# Patient Record
Sex: Female | Born: 1970
Health system: Southern US, Community
[De-identification: ages and names within clinical notes are randomized; demographics above are authoritative.]

## PROBLEM LIST (undated history)

## (undated) DIAGNOSIS — G8929 Other chronic pain: Secondary | ICD-10-CM

## (undated) DIAGNOSIS — I10 Essential (primary) hypertension: Secondary | ICD-10-CM

## (undated) DIAGNOSIS — Z930 Tracheostomy status: Secondary | ICD-10-CM

## (undated) DIAGNOSIS — E785 Hyperlipidemia, unspecified: Secondary | ICD-10-CM

## (undated) DIAGNOSIS — E119 Type 2 diabetes mellitus without complications: Secondary | ICD-10-CM

## (undated) DIAGNOSIS — R0902 Hypoxemia: Secondary | ICD-10-CM

## (undated) DIAGNOSIS — R51 Headache: Secondary | ICD-10-CM

## (undated) DIAGNOSIS — R0600 Dyspnea, unspecified: Secondary | ICD-10-CM

## (undated) DIAGNOSIS — J989 Respiratory disorder, unspecified: Secondary | ICD-10-CM

## (undated) DIAGNOSIS — J449 Chronic obstructive pulmonary disease, unspecified: Secondary | ICD-10-CM

## (undated) DIAGNOSIS — F32A Depression, unspecified: Secondary | ICD-10-CM

## (undated) DIAGNOSIS — Z8719 Personal history of other diseases of the digestive system: Secondary | ICD-10-CM

## (undated) DIAGNOSIS — M1990 Unspecified osteoarthritis, unspecified site: Secondary | ICD-10-CM

## (undated) DIAGNOSIS — T7840XA Allergy, unspecified, initial encounter: Secondary | ICD-10-CM

## (undated) DIAGNOSIS — G4730 Sleep apnea, unspecified: Secondary | ICD-10-CM

## (undated) DIAGNOSIS — G709 Myoneural disorder, unspecified: Secondary | ICD-10-CM

## (undated) DIAGNOSIS — R933 Abnormal findings on diagnostic imaging of other parts of digestive tract: Secondary | ICD-10-CM

## (undated) DIAGNOSIS — J189 Pneumonia, unspecified organism: Secondary | ICD-10-CM

## (undated) DIAGNOSIS — F419 Anxiety disorder, unspecified: Secondary | ICD-10-CM

## (undated) DIAGNOSIS — K219 Gastro-esophageal reflux disease without esophagitis: Secondary | ICD-10-CM

## (undated) DIAGNOSIS — R079 Chest pain, unspecified: Secondary | ICD-10-CM

## (undated) DIAGNOSIS — M549 Dorsalgia, unspecified: Secondary | ICD-10-CM

## (undated) DIAGNOSIS — D649 Anemia, unspecified: Secondary | ICD-10-CM

## (undated) DIAGNOSIS — E876 Hypokalemia: Secondary | ICD-10-CM

## (undated) DIAGNOSIS — F329 Major depressive disorder, single episode, unspecified: Secondary | ICD-10-CM

## (undated) HISTORY — PX: TUBAL LIGATION: SHX77

## (undated) HISTORY — DX: Allergy, unspecified, initial encounter: T78.40XA

## (undated) HISTORY — DX: Myoneural disorder, unspecified: G70.9

## (undated) HISTORY — DX: Hypoxemia: R09.02

## (undated) HISTORY — PX: SPINE SURGERY: SHX786

## (undated) HISTORY — DX: Type 2 diabetes mellitus without complications: E11.9

## (undated) HISTORY — DX: Hypokalemia: E87.6

## (undated) HISTORY — DX: Essential (primary) hypertension: I10

## (undated) HISTORY — PX: APPENDECTOMY: SHX54

## (undated) HISTORY — PX: HERNIA REPAIR: SHX51

## (undated) HISTORY — PX: ROTATOR CUFF REPAIR: SHX139

---

## 2005-05-05 ENCOUNTER — Emergency Department (HOSPITAL_COMMUNITY): Admission: EM | Admit: 2005-05-05 | Discharge: 2005-05-05 | Payer: Self-pay | Admitting: Emergency Medicine

## 2005-05-08 ENCOUNTER — Emergency Department (HOSPITAL_COMMUNITY): Admission: EM | Admit: 2005-05-08 | Discharge: 2005-05-08 | Payer: Self-pay | Admitting: Emergency Medicine

## 2005-06-16 ENCOUNTER — Emergency Department (HOSPITAL_COMMUNITY): Admission: EM | Admit: 2005-06-16 | Discharge: 2005-06-16 | Payer: Self-pay | Admitting: Emergency Medicine

## 2005-06-19 ENCOUNTER — Emergency Department (HOSPITAL_COMMUNITY): Admission: EM | Admit: 2005-06-19 | Discharge: 2005-06-19 | Payer: Self-pay | Admitting: Emergency Medicine

## 2005-07-30 ENCOUNTER — Emergency Department (HOSPITAL_COMMUNITY): Admission: EM | Admit: 2005-07-30 | Discharge: 2005-07-30 | Payer: Self-pay | Admitting: Emergency Medicine

## 2005-10-25 ENCOUNTER — Emergency Department (HOSPITAL_COMMUNITY): Admission: EM | Admit: 2005-10-25 | Discharge: 2005-10-25 | Payer: Self-pay | Admitting: Emergency Medicine

## 2005-10-27 ENCOUNTER — Emergency Department (HOSPITAL_COMMUNITY): Admission: EM | Admit: 2005-10-27 | Discharge: 2005-10-27 | Payer: Self-pay | Admitting: Emergency Medicine

## 2005-10-31 ENCOUNTER — Emergency Department (HOSPITAL_COMMUNITY): Admission: EM | Admit: 2005-10-31 | Discharge: 2005-10-31 | Payer: Self-pay | Admitting: Family Medicine

## 2005-12-06 IMAGING — CR DG CHEST 1V PORT
1 series · 1 of 1 positions shown · non-contrast
Comparison: none

CLINICAL DATA: Chest pain for two days.  Hypertension.  Reformed smoker.  History of asthma.
 PORTABLE FQWIW-[id number] VIEW:
 An AP semierect portable film of the chest made 05/08/05 at 2626 hours shows the heart, lungs, bony thorax and soft tissues to be within normal limits considering the patient?s body habitus.

[view not recorded]
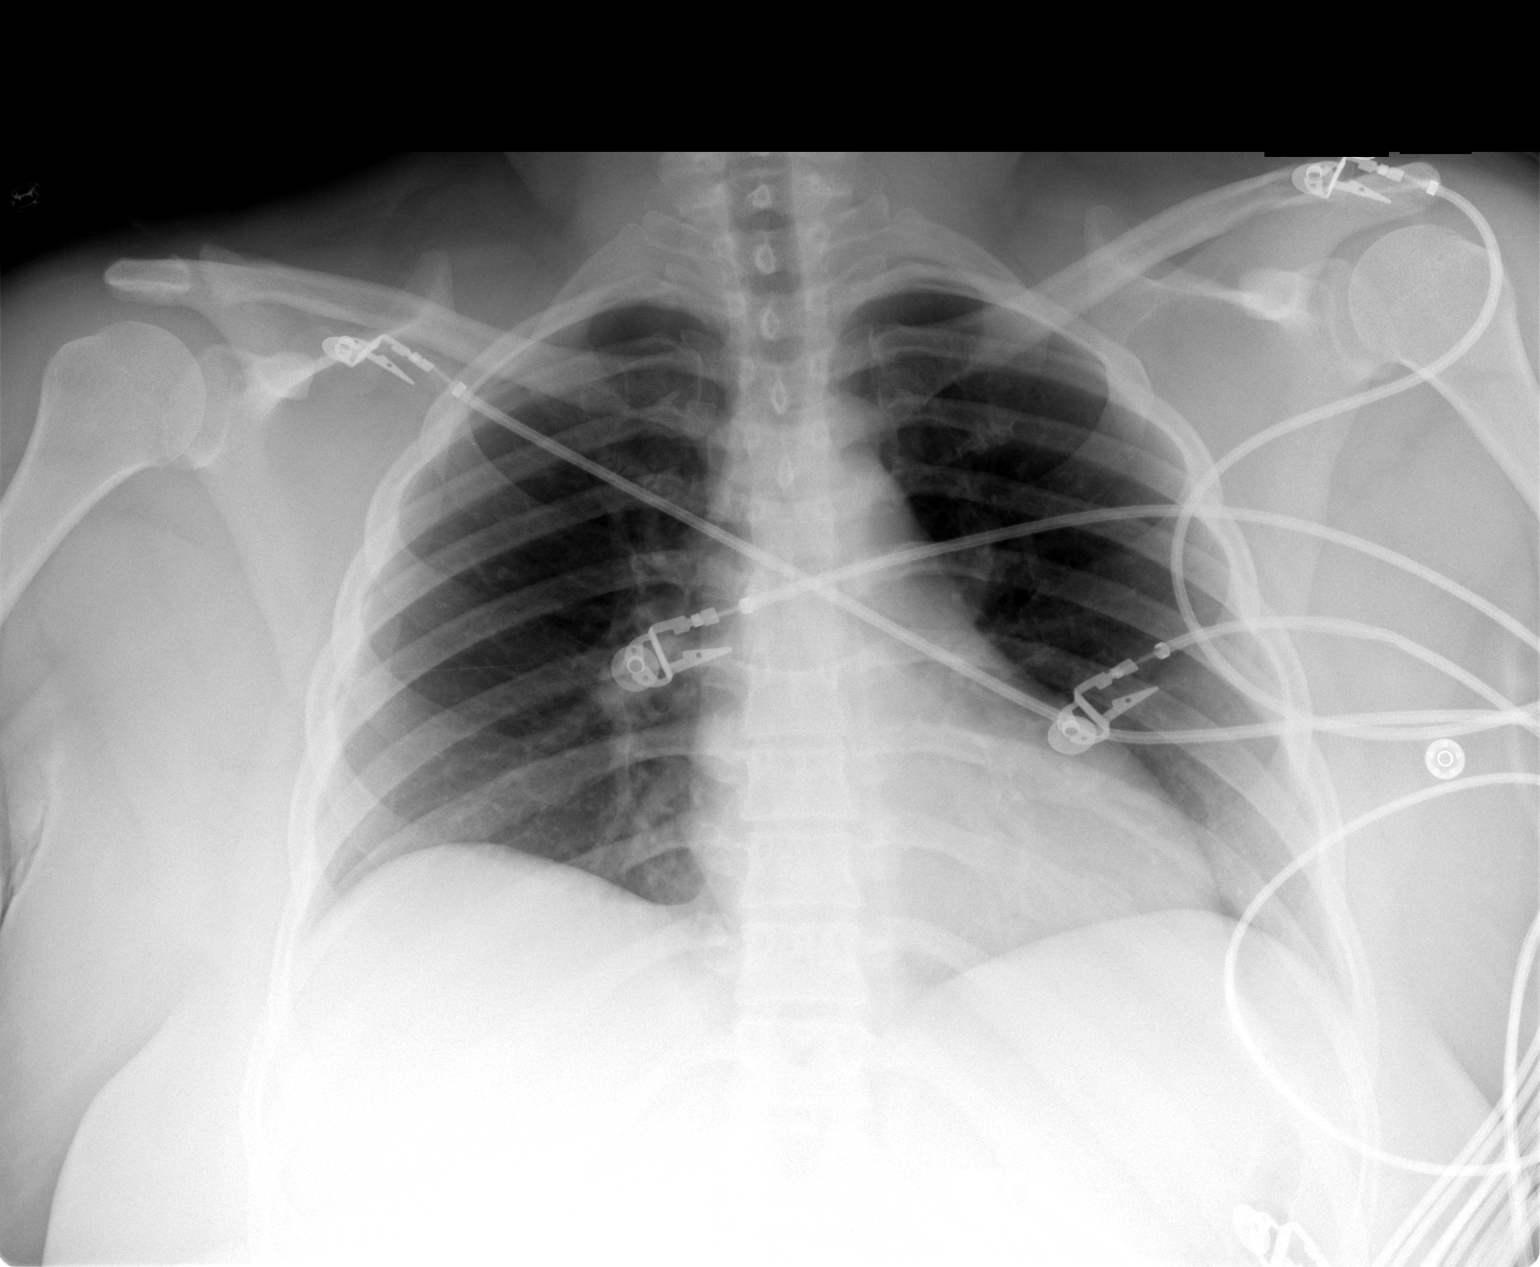

[1 of 1 positions shown; findings below may reference images not displayed]

IMPRESSION: Normal chest for body habitus.

## 2005-12-06 IMAGING — CT CT HEAD W/O CM
1 series · 16 of 30 positions shown, 20 images · non-contrast
Comparison: none

CLINICAL DATA: Left temporal headache.  Left arm numbness.
 HEAD CT WITHOUT CONTRAST ? 05/08/05:
TECHNIQUE: Contiguous axial CT images were obtained from the base of the skull through the vertex according to standard protocol without contrast.

[Series 2: brain · axial · 0.47mm/px · z∈[+149,+279]mm · 16 of 36 slices shown, 20 images]
[im 2/36  brain]
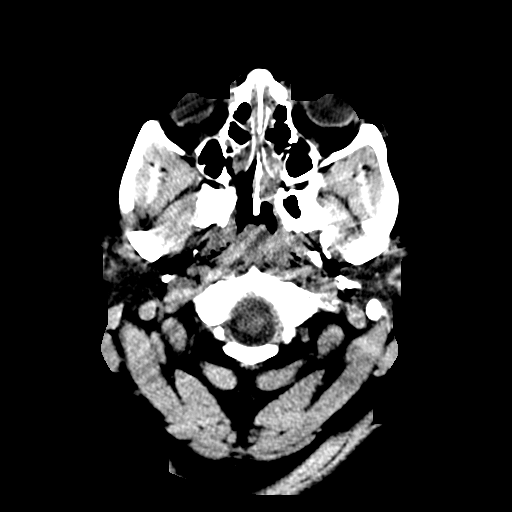
[im 2/36  bone]
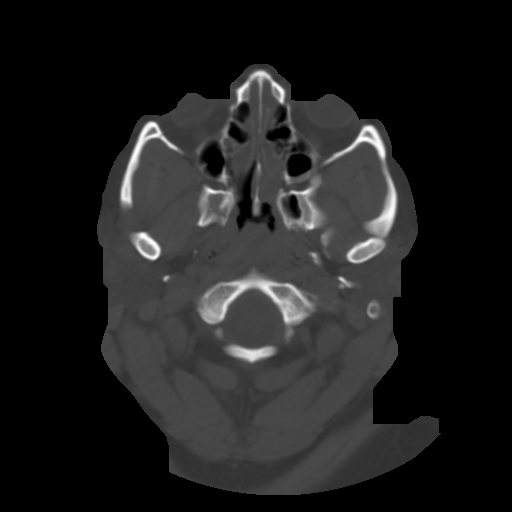
[im 4/36  brain]
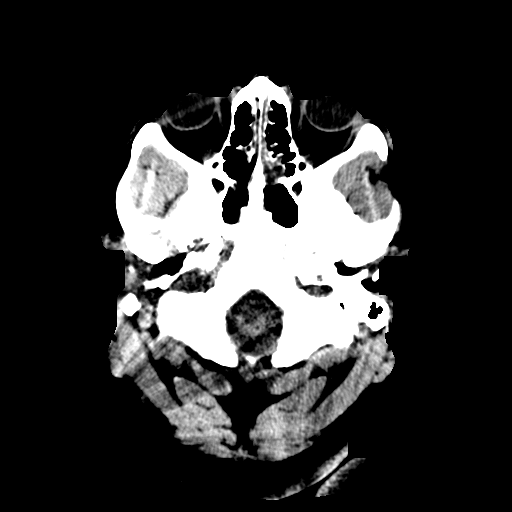
[im 7/36  brain]
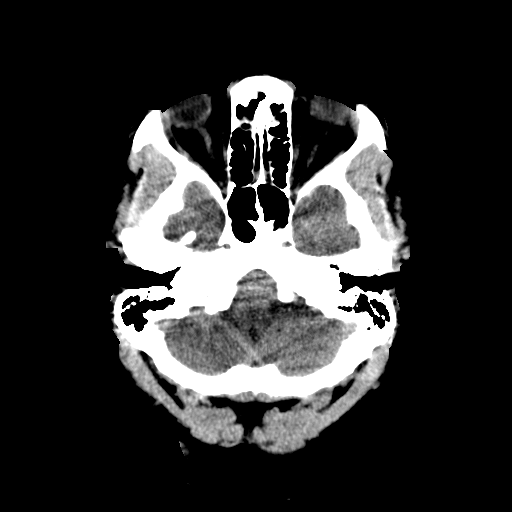
[im 9/36  brain]
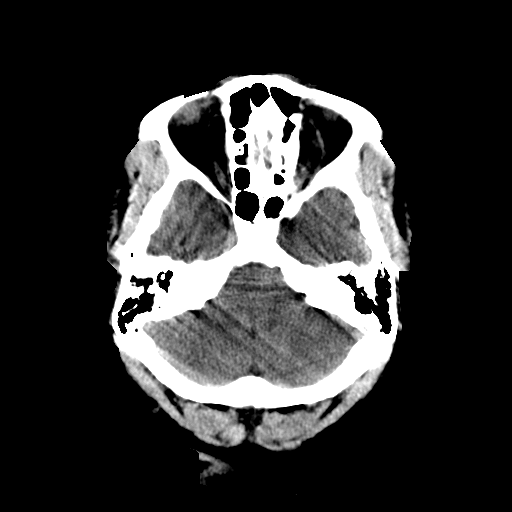
[im 10/36  brain]
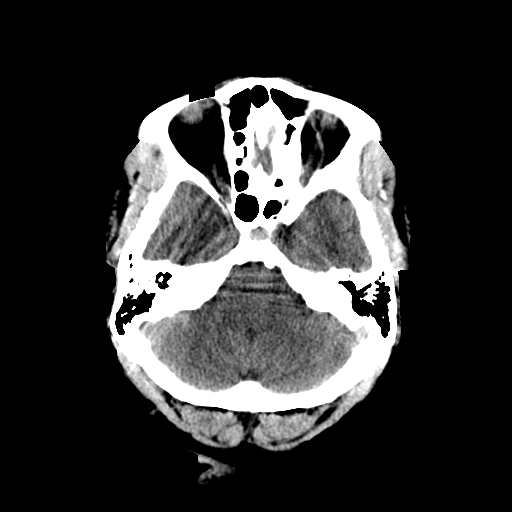
[im 10/36  bone]
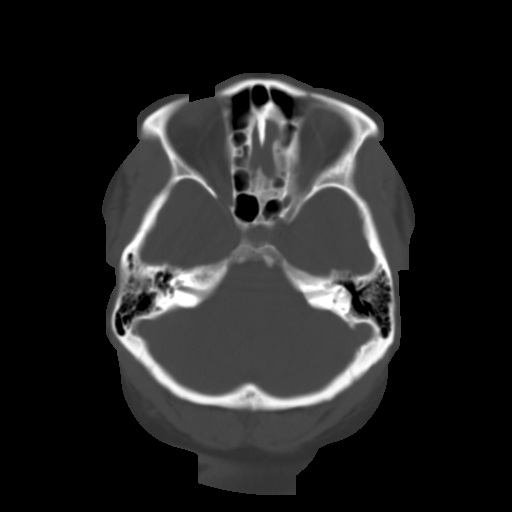
[im 13/36  brain]
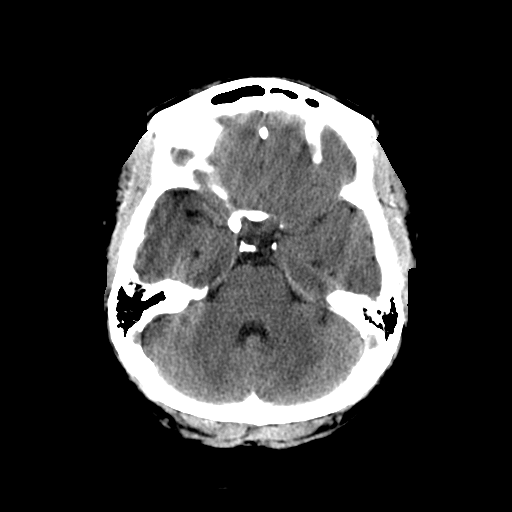
[im 15/36  brain]
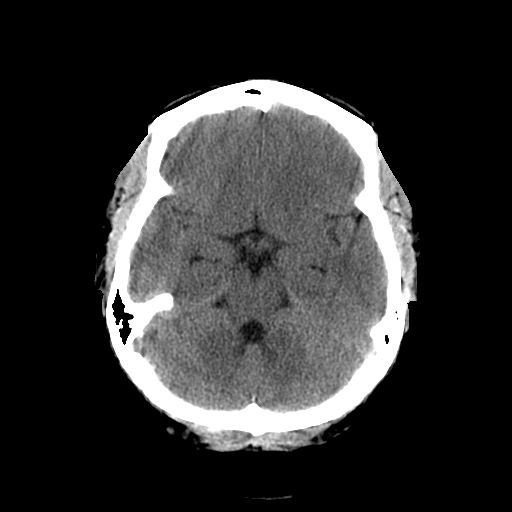
[im 17/36  brain]
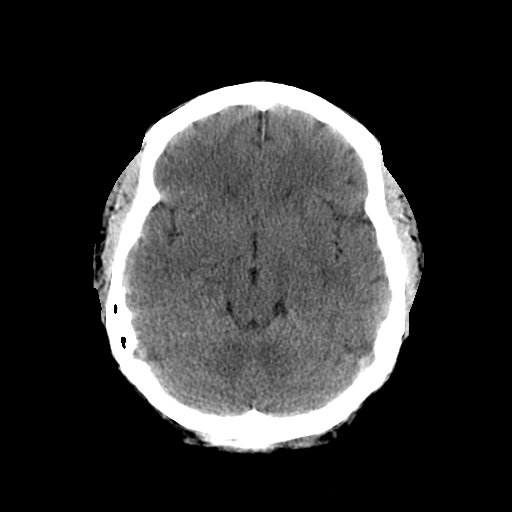
[im 19/36  brain]
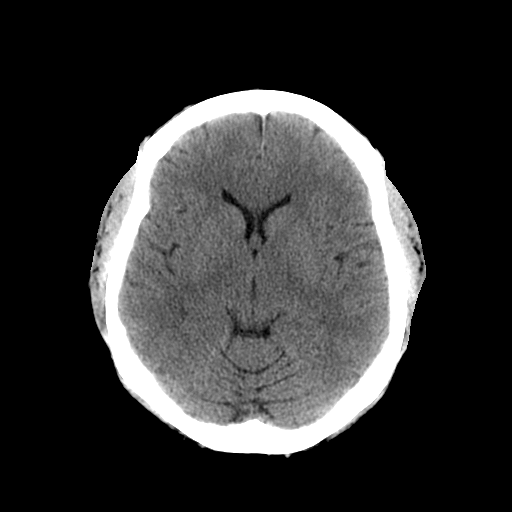
[im 19/36  bone]
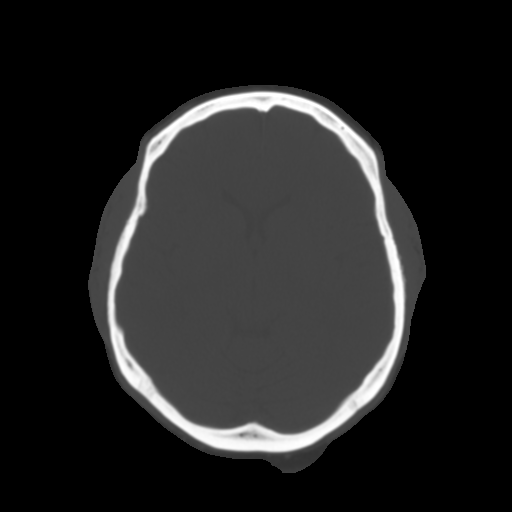
[im 21/36  brain]
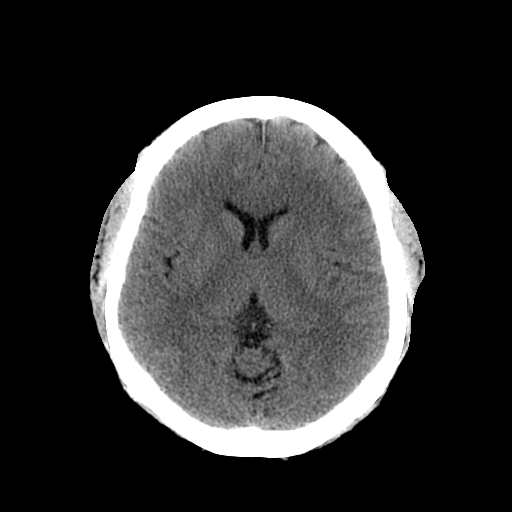
[im 23/36  brain]
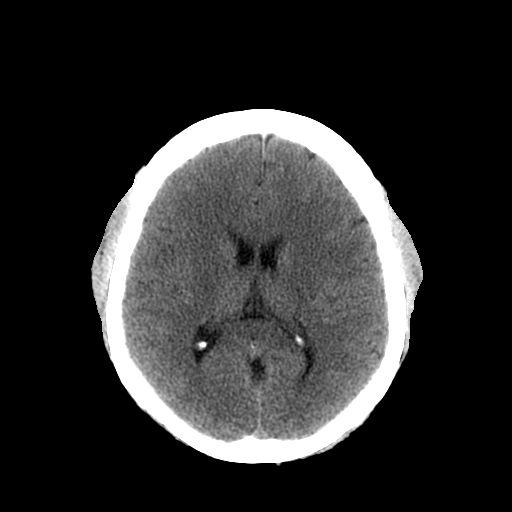
[im 26/36  brain]
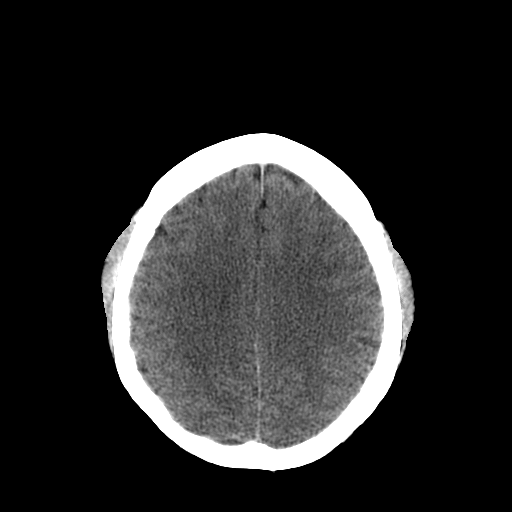
[im 27/36  brain]
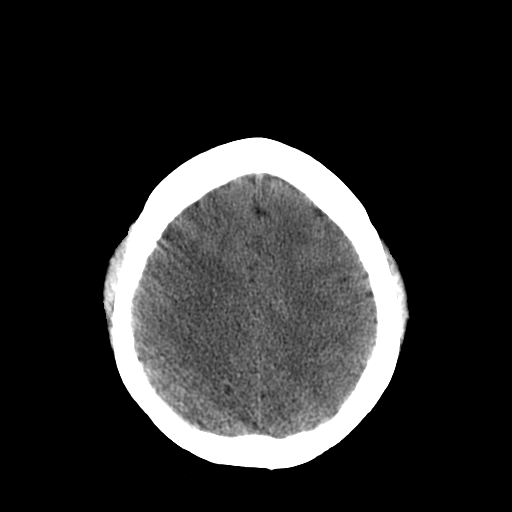
[im 27/36  bone]
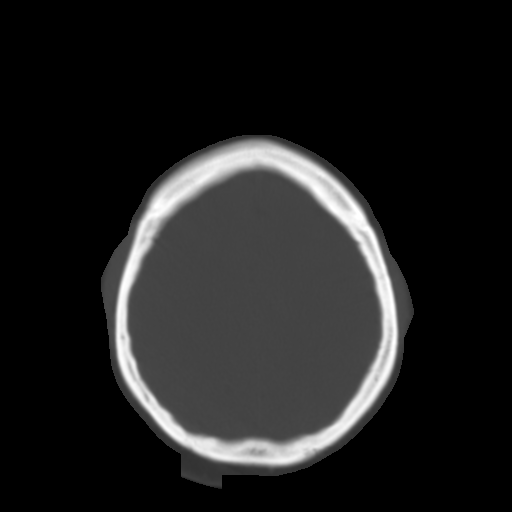
[im 29/36  brain]
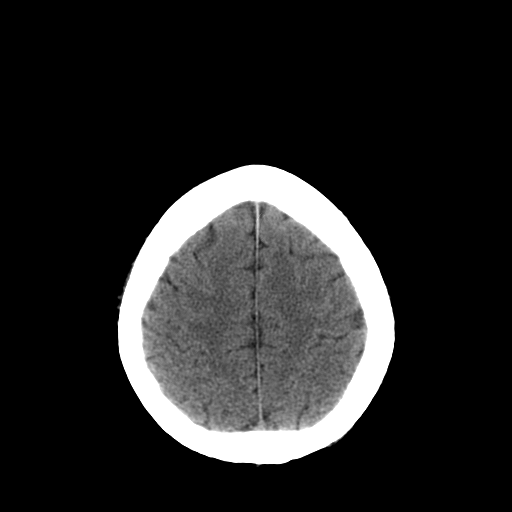
[im 32/36  brain]
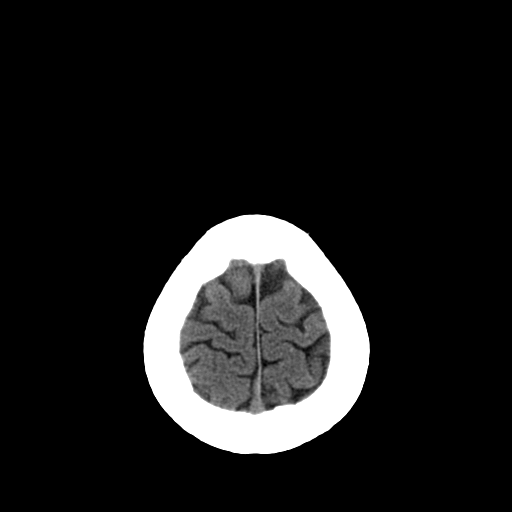
[im 34/36  brain]
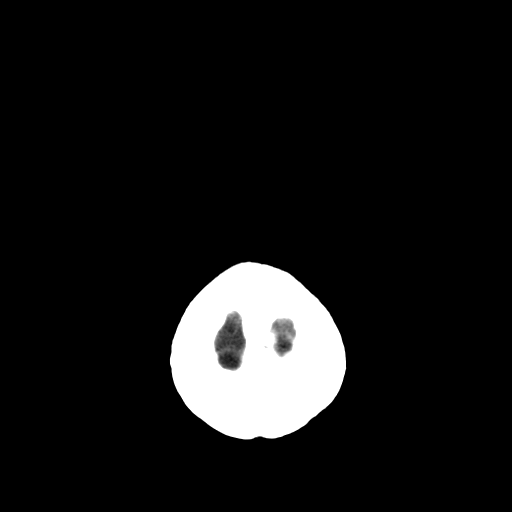

[16 of 30 positions shown; findings below may reference images not displayed]

FINDINGS: The brain appears normal without evidence of hemorrhage, infarct, mass, mass effect, midline shift, or abnormal extra-axial fluid collection.  There is scattered opacification of the ethmoidal air cells with mucosal thickening in the nasal cavity.  The images paranasal sinuses and mastoid air cells are otherwise clear.
IMPRESSION: 1.  No acute intracranial abnormalities.
 2.  Ethmoid sinus disease.

## 2005-12-27 ENCOUNTER — Emergency Department (HOSPITAL_COMMUNITY): Admission: EM | Admit: 2005-12-27 | Discharge: 2005-12-27 | Payer: Self-pay | Admitting: Emergency Medicine

## 2006-02-27 IMAGING — CT CT HEAD W/O CM
1 series · 16 of 30 positions shown, 20 images · IV contrast (agent unspecified)
Comparison: 05/08/05.

CLINICAL DATA: Headache, history of migraines. Tingling on the right side, numbness. 
 HEAD CT WITHOUT CONTRAST:
TECHNIQUE: Contiguous axial images were obtained from the base of the skull through the vertex according to standard protocol without contrast.

[Series 2: brain · axial · 0.47mm/px · z∈[+150,+273]mm · 16 of 34 slices shown, 20 images]
[im 2/34  brain]
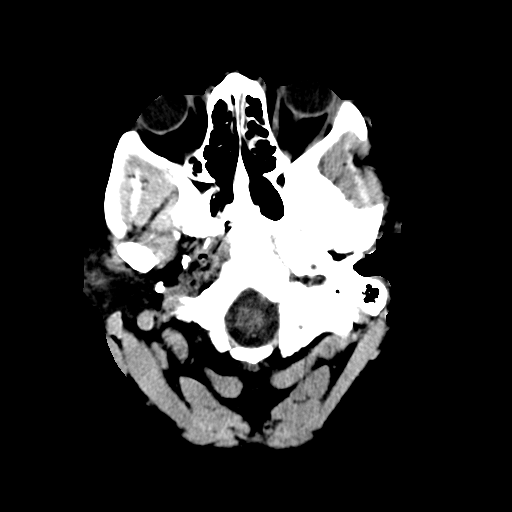
[im 2/34  bone]
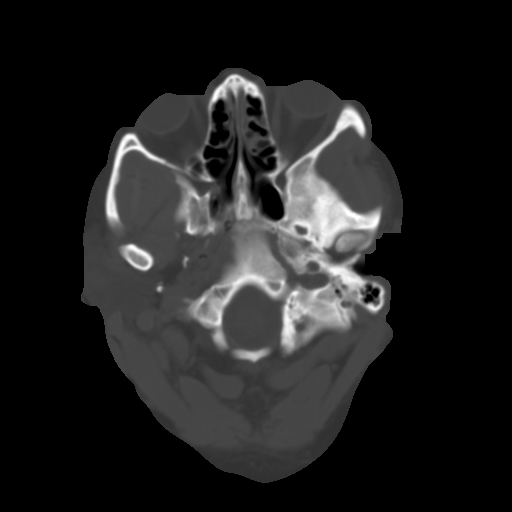
[im 4/34  brain]
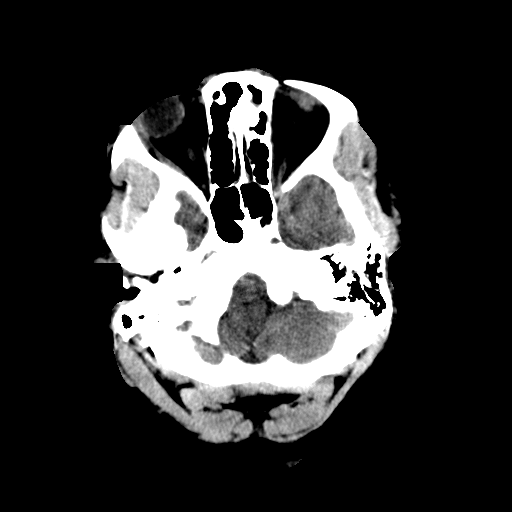
[im 6/34  brain]
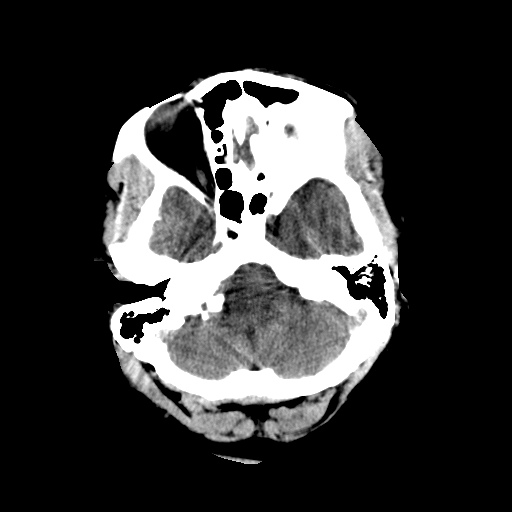
[im 8/34  brain]
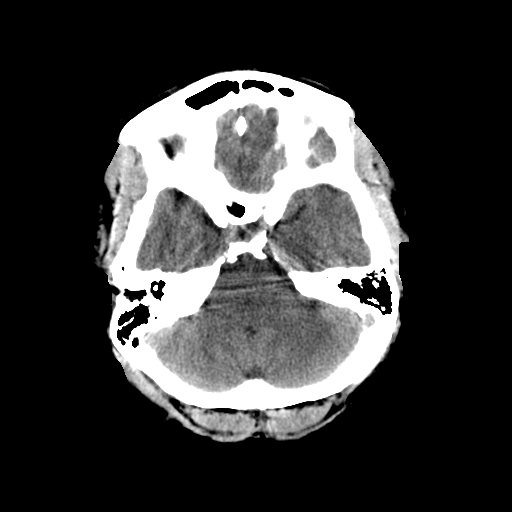
[im 10/34  brain]
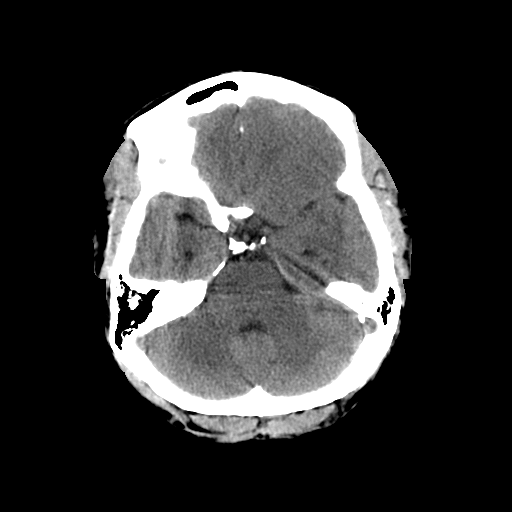
[im 10/34  bone]
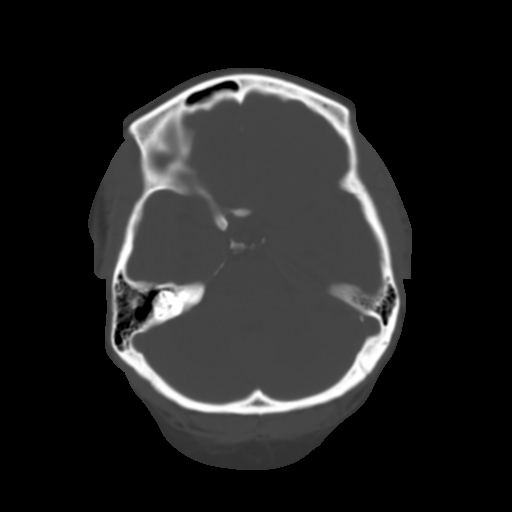
[im 12/34  brain]
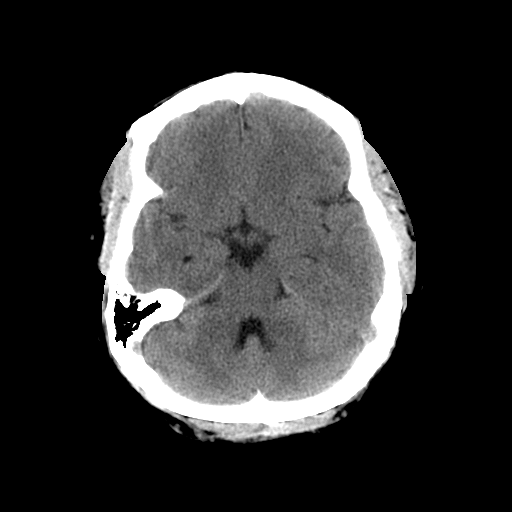
[im 14/34  brain]
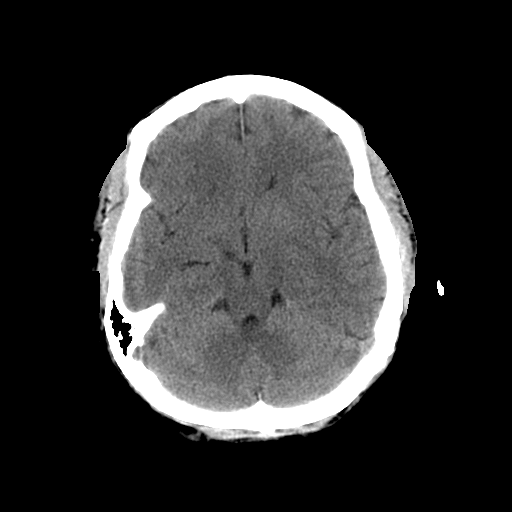
[im 16/34  brain]
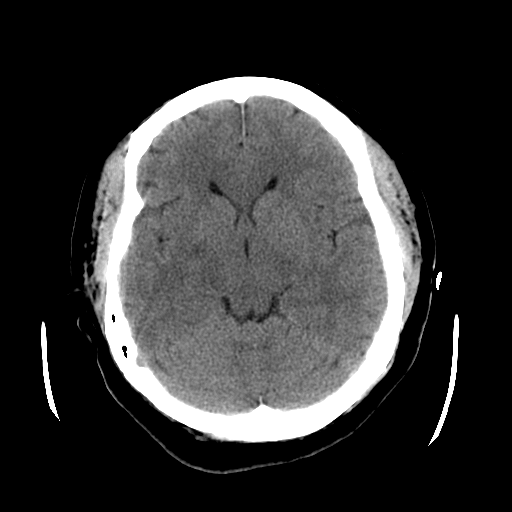
[im 18/34  brain]
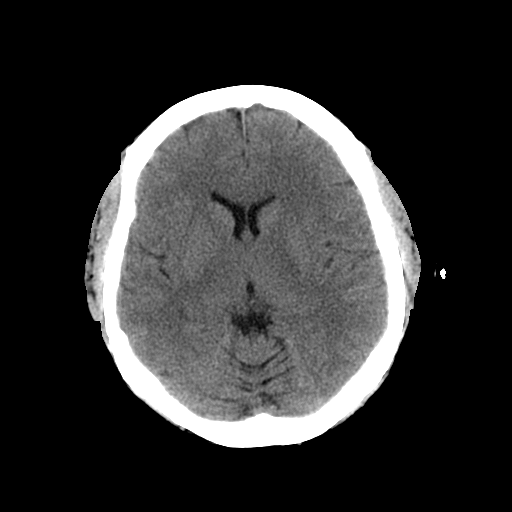
[im 18/34  bone]
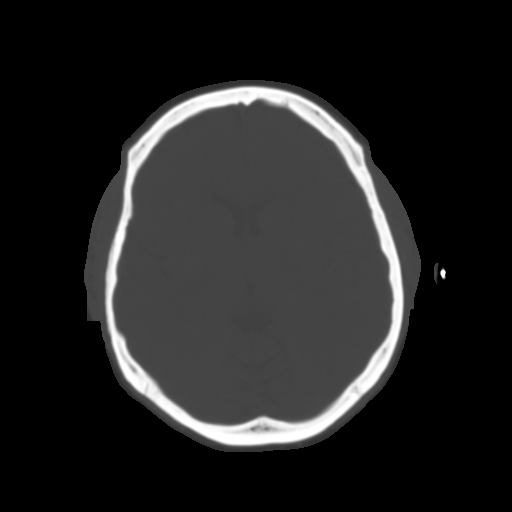
[im 20/34  brain]
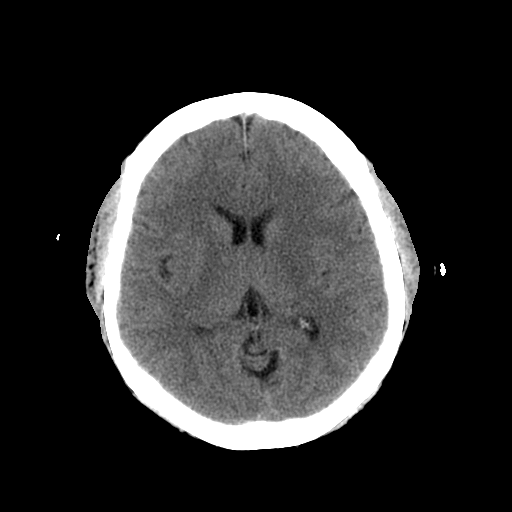
[im 22/34  brain]
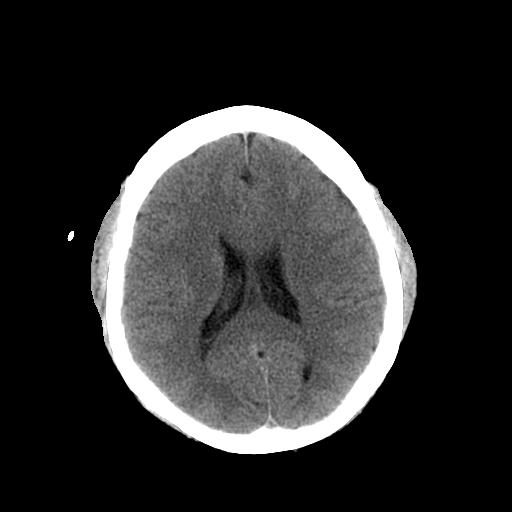
[im 24/34  brain]
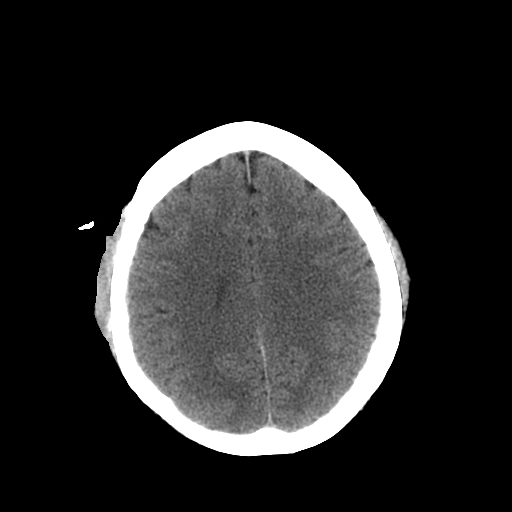
[im 26/34  brain]
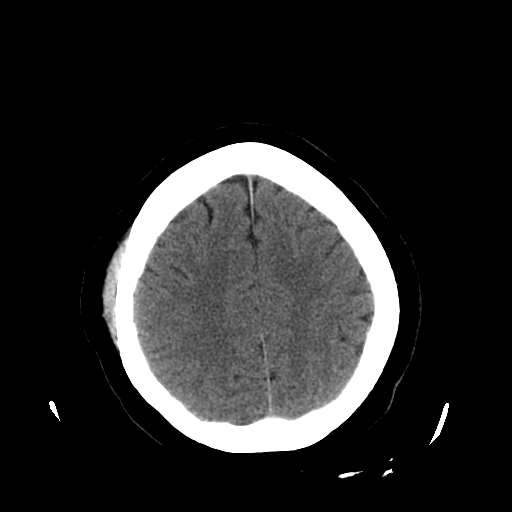
[im 26/34  bone]
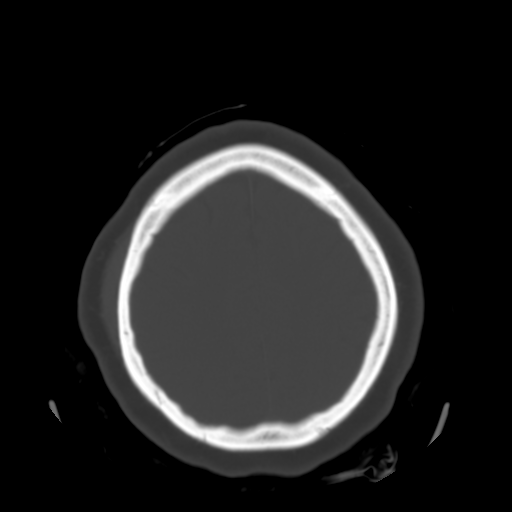
[im 28/34  brain]
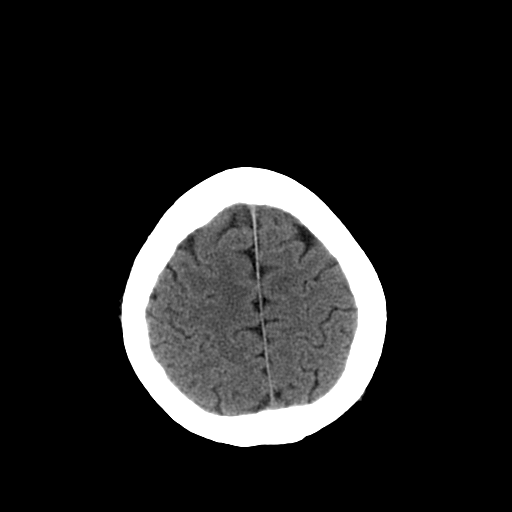
[im 30/34  brain]
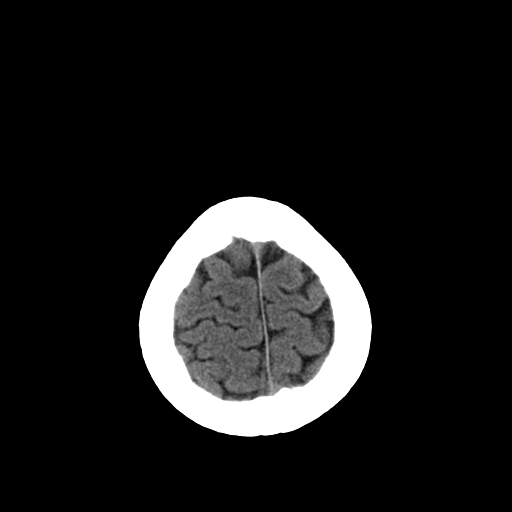
[im 32/34  brain]
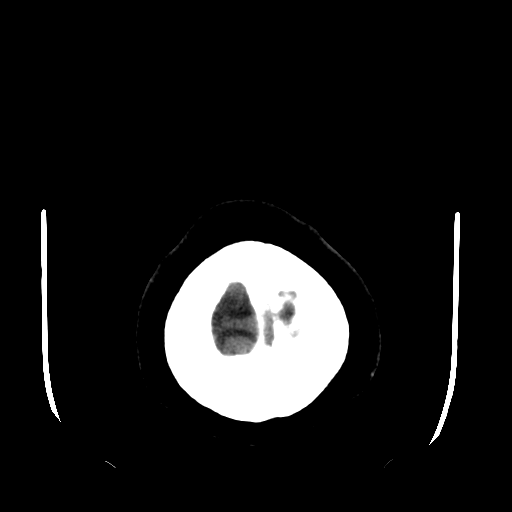

[16 of 30 positions shown; findings below may reference images not displayed]

FINDINGS: The brain appears normal without evidence of hemorrhage, infarct, mass, mass effect, midline shift or abnormal extra-axial fluid collection.   No hydrocephalus. Some scattered ethmoid air cell opacification appears somewhat improved.
IMPRESSION: No acute intracranial abnormality with some improvement in mild ethmoid sinus disease.

## 2006-05-25 IMAGING — CT CT HEAD W/O CM
1 of 2 series · 13 of 30 positions shown, 17 images · IV contrast (agent unspecified)
Comparison: 07/30/05.

CLINICAL DATA: Severe headache with nausea and vomiting.  
 HEAD CT WITHOUT CONTRAST:
TECHNIQUE: Contiguous axial images were obtained from the base of the skull through the vertex according to standard protocol without contrast.

[Series 2: brain · axial · 0.47mm/px · z∈[+140,+260]mm · 13 of 28 slices shown, 17 images]
[im 2/28  brain]
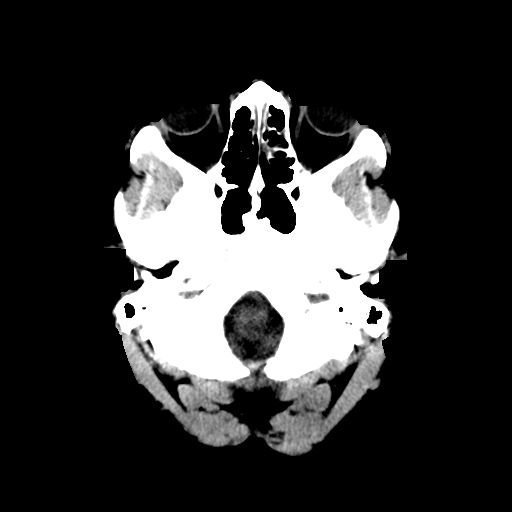
[im 2/28  bone]
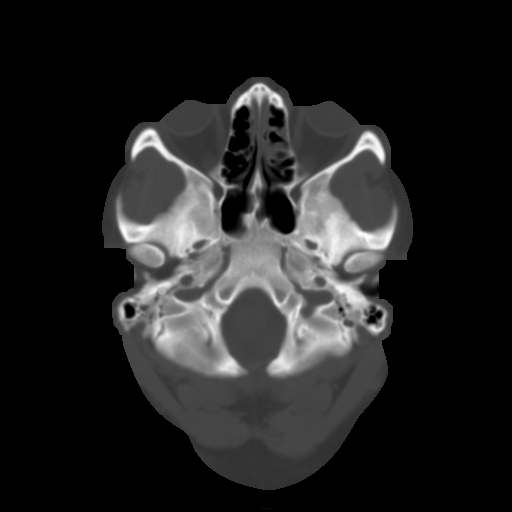
[im 4/28  brain]
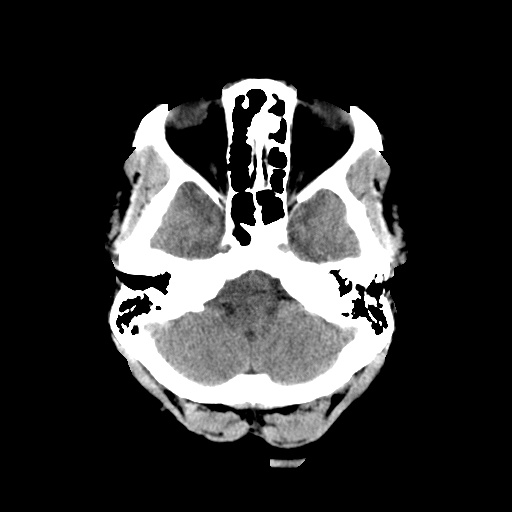
[im 6/28  brain]
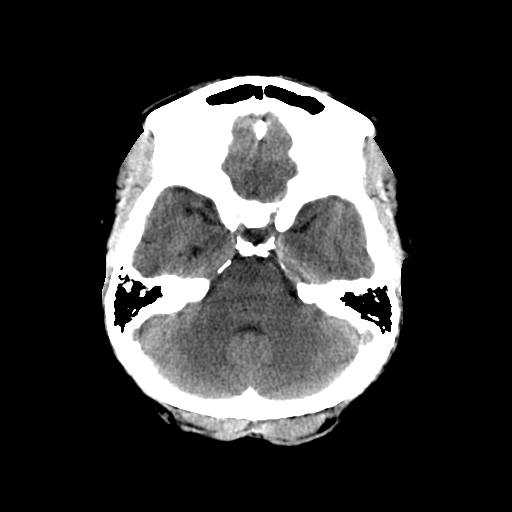
[im 8/28  brain]
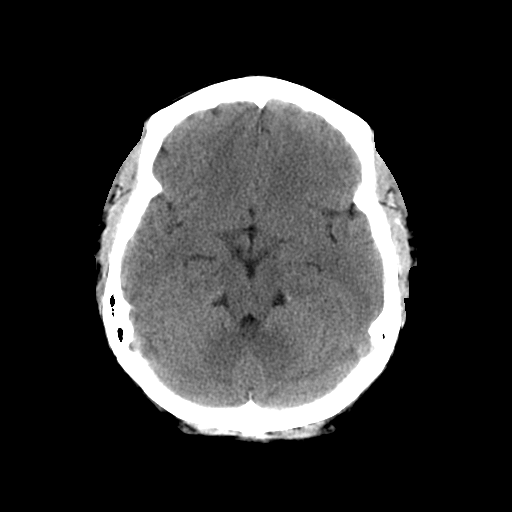
[im 10/28  brain]
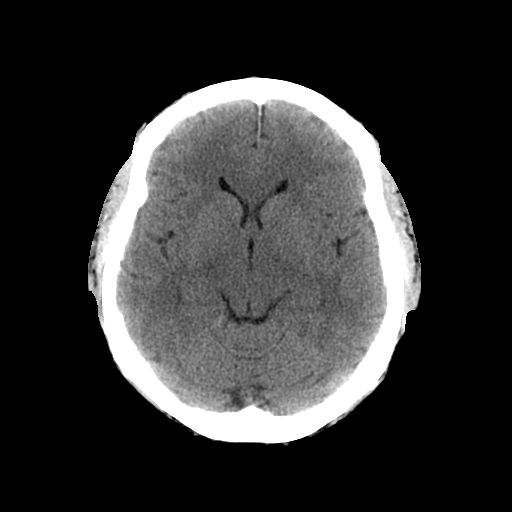
[im 10/28  bone]
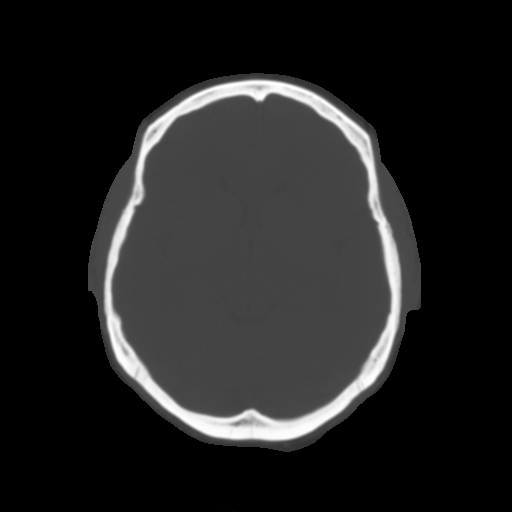
[im 12/28  brain]
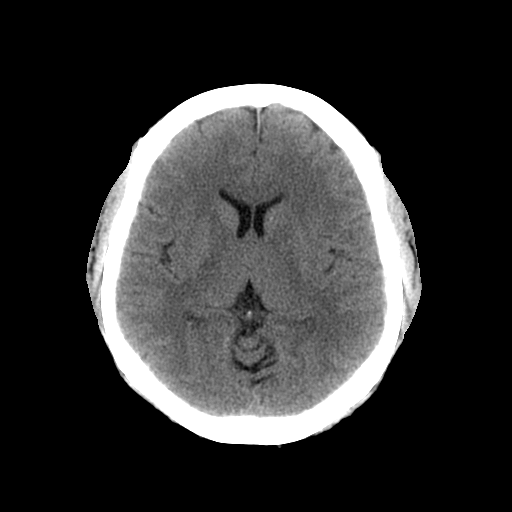
[im 14/28  brain]
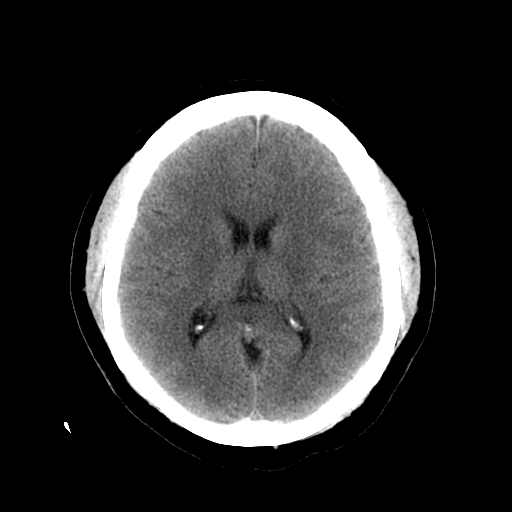
[im 16/28  brain]
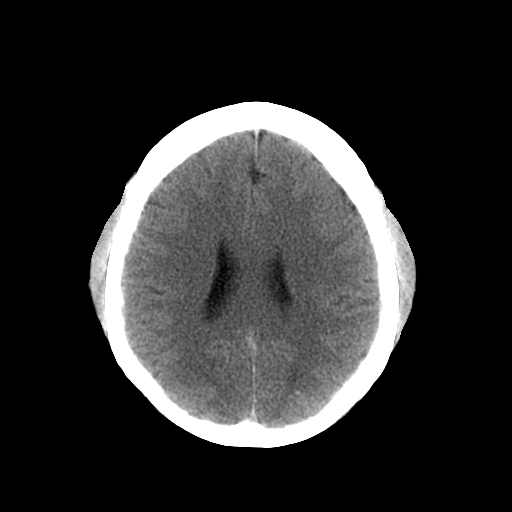
[im 18/28  brain]
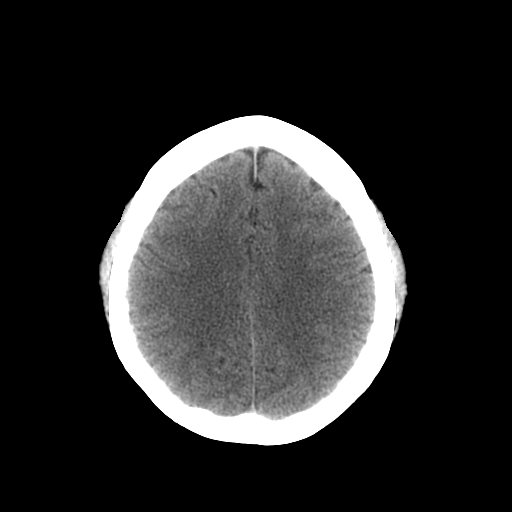
[im 18/28  bone]
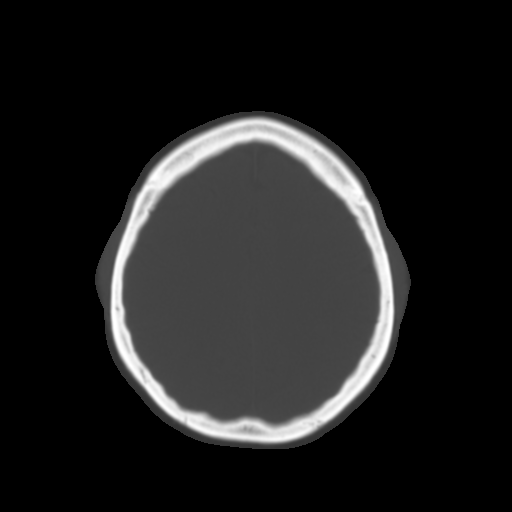
[im 20/28  brain]
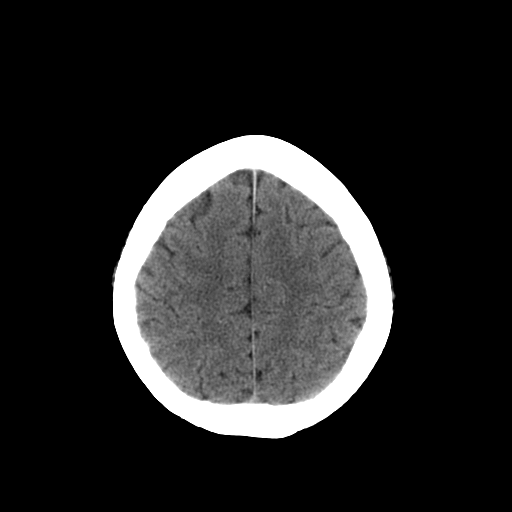
[im 22/28  brain]
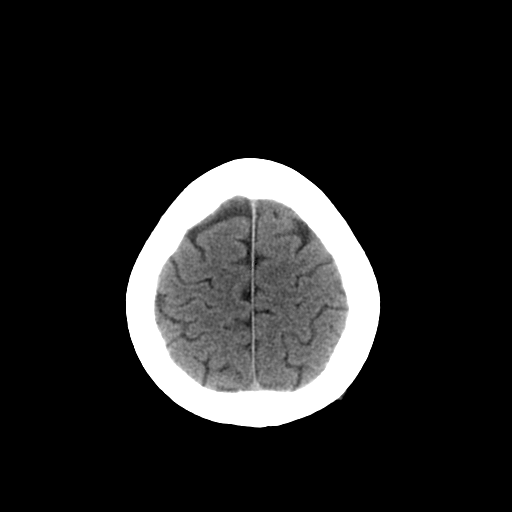
[im 24/28  brain]
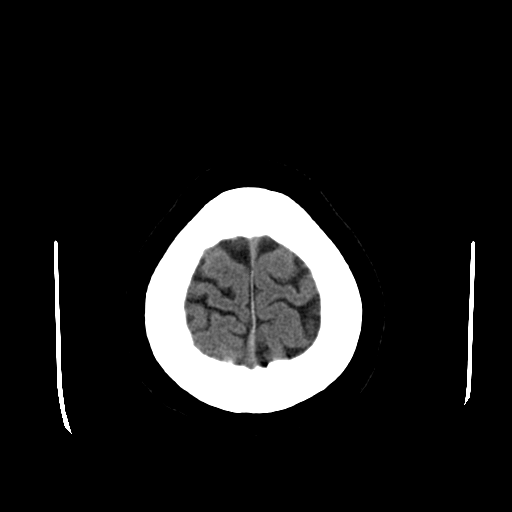
[im 26/28  brain]
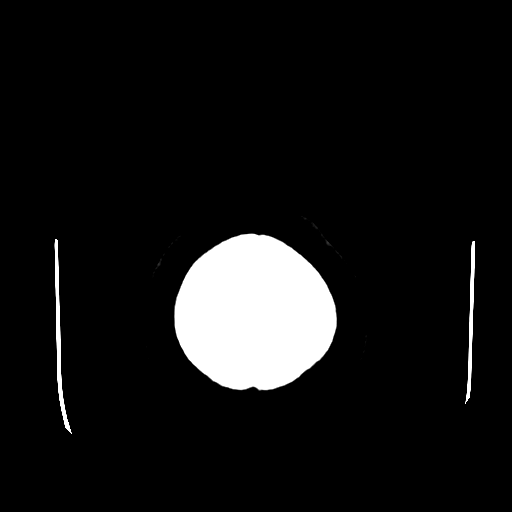
[im 26/28  bone]
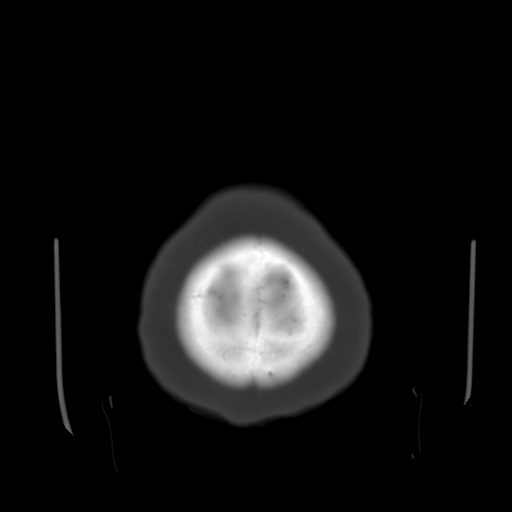

[13 of 30 positions shown; findings below may reference images not displayed]

FINDINGS: No acute intracranial abnormality is identified including mass or mass effect, hydrocephalus, extraaxial fluid collection, midline shift, hemorrhage, or infarct.  Acute infarct may be missed by CT for 24 to 48 hours.  Mild ethmoid sinusitis is again noted.
IMPRESSION: 1.  No evidence of acute intracranial abnormality.  
 2.  Stable mild chronic ethmoid sinusitis.

## 2006-12-17 ENCOUNTER — Emergency Department (HOSPITAL_COMMUNITY): Admission: EM | Admit: 2006-12-17 | Discharge: 2006-12-17 | Payer: Self-pay | Admitting: Family Medicine

## 2007-05-23 ENCOUNTER — Emergency Department (HOSPITAL_COMMUNITY): Admission: EM | Admit: 2007-05-23 | Discharge: 2007-05-24 | Payer: Self-pay | Admitting: Emergency Medicine

## 2007-05-25 ENCOUNTER — Emergency Department (HOSPITAL_COMMUNITY): Admission: EM | Admit: 2007-05-25 | Discharge: 2007-05-26 | Payer: Self-pay | Admitting: *Deleted

## 2007-09-23 HISTORY — PX: ABDOMINAL HYSTERECTOMY: SHX81

## 2007-09-23 HISTORY — PX: VESICOVAGINAL FISTULA CLOSURE W/ TAH: SUR271

## 2007-12-21 IMAGING — CR DG ABDOMEN ACUTE W/ 1V CHEST
4 series · 4 of 4 positions shown · non-contrast
Comparison: Chest radiograph 06/16/05.

CLINICAL DATA: 35-year-old female, abdominal pain. 
 ACUTE ABDOMINAL SERIES WITH CHEST:

[w chest pa]
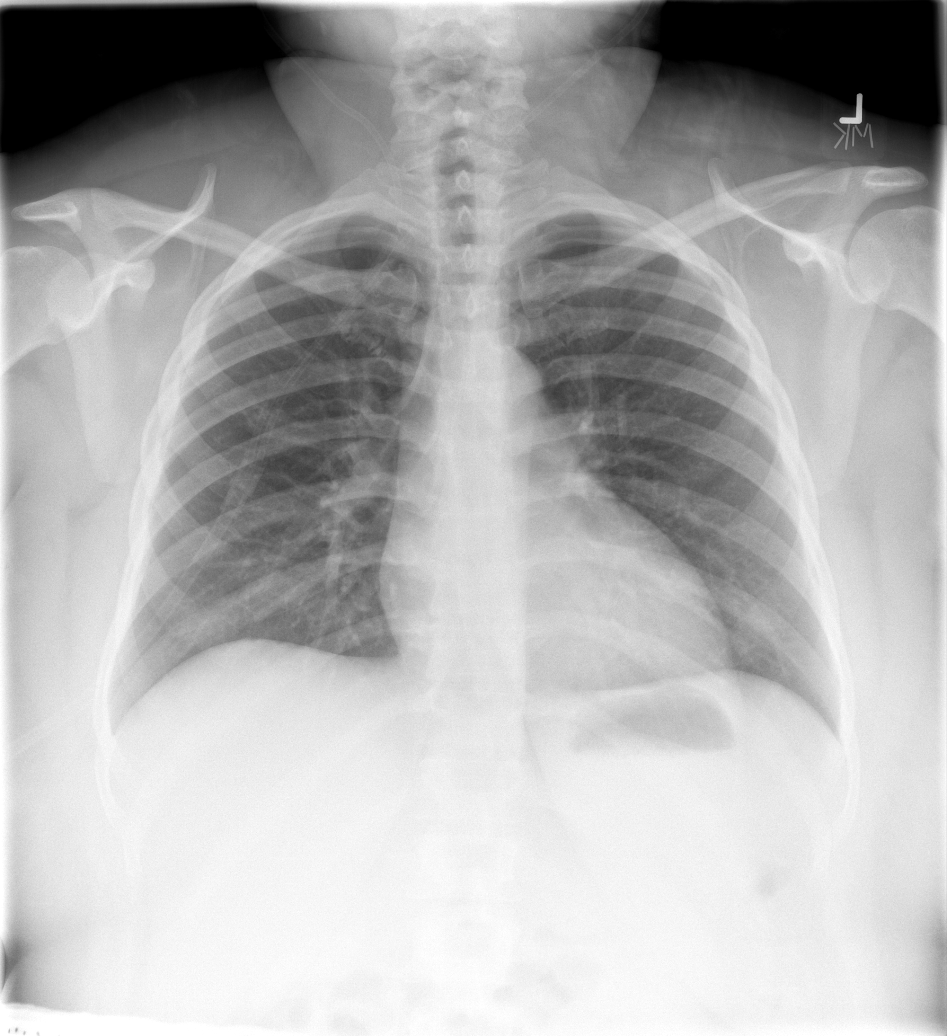

[w abdomen upright]
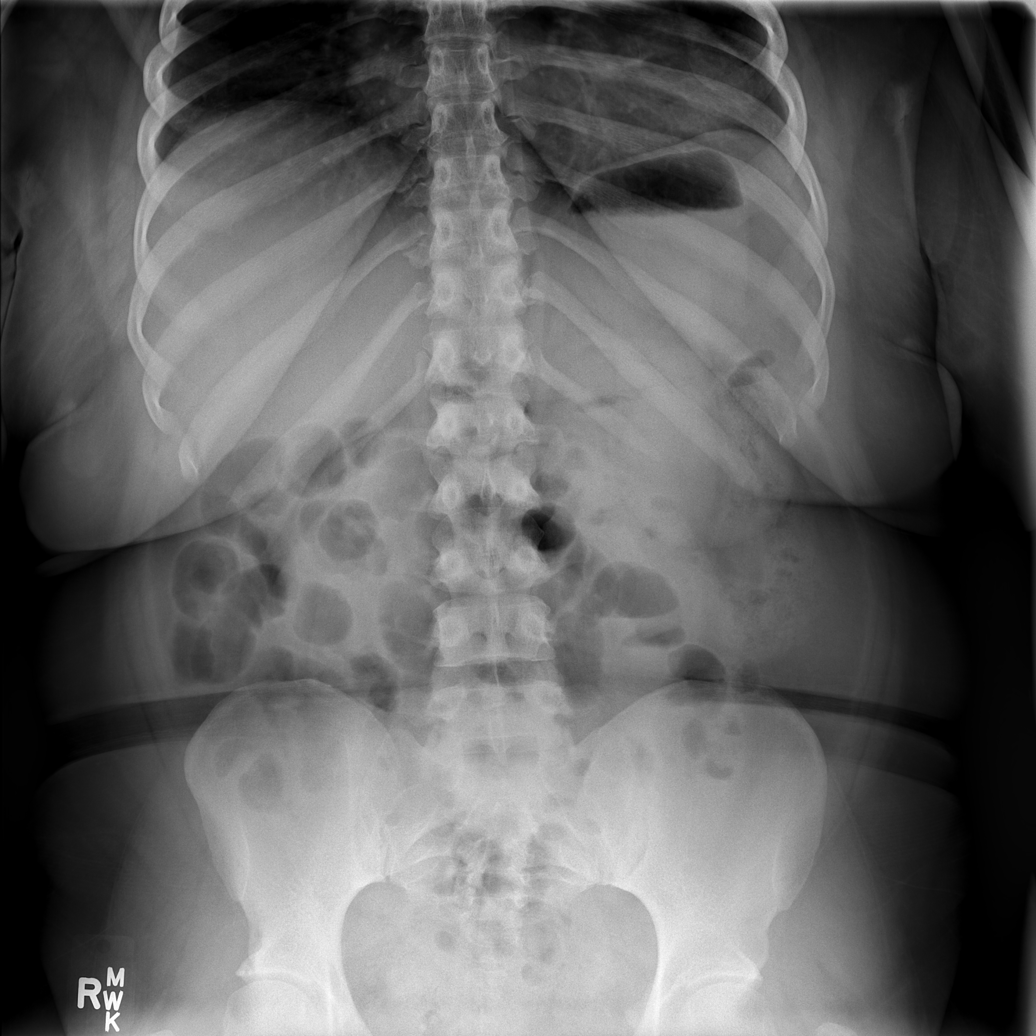

[t abdomen supine (1 of 2)]
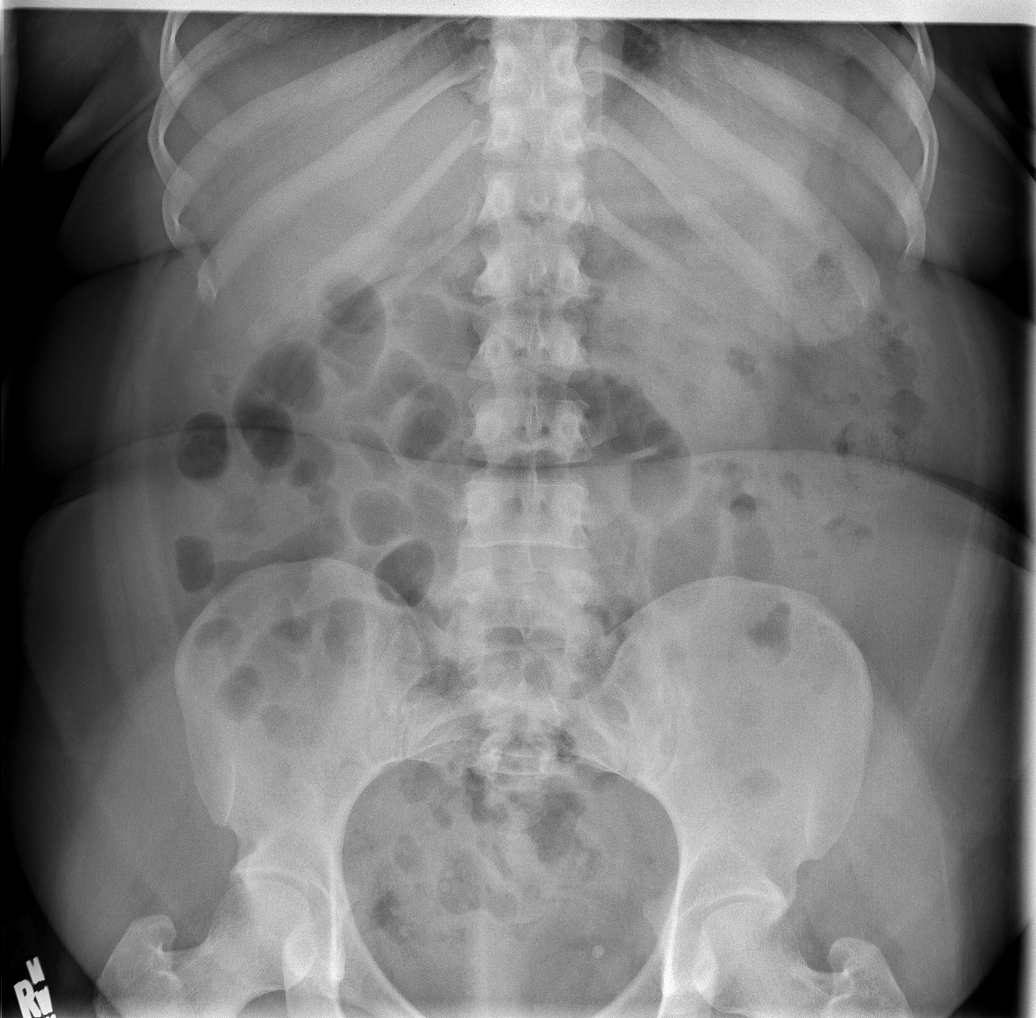

[t abdomen supine (2 of 2)]
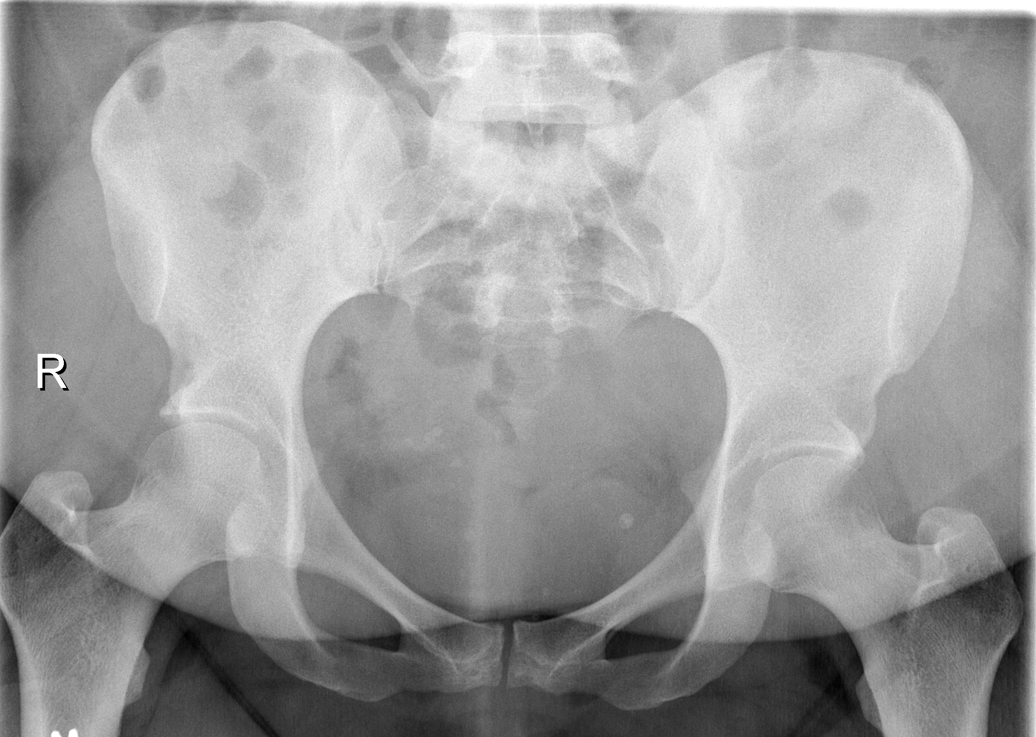

[4 of 4 positions shown; findings below may reference images not displayed]

FINDINGS: The cardiopericardial silhouette is within normal limits for size.  Lungs are clear.  An air fluid level is present in the left upper quadrant on the upright view.  There is no significant bowel dilatation.  Air is seen to the left of the colon.  This may represent a mild ileus.
IMPRESSION: Two air fluid levels left upper quadrant without dilation.  This may represent a mild ileus.

## 2007-12-22 IMAGING — CT CT ABDOMEN W/ CM
2 of 5 series · 17 of 46 positions shown, 19 images · IV contrast (APPLIED)
Comparison: None available.

CLINICAL DATA: Abdominal pain.  Question hernia. 
 ABDOMEN CT WITH CONTRAST:
TECHNIQUE: Multidetector CT imaging of the abdomen was performed following the standard protocol during bolus administration of intravenous contrast.
 Contrast:  100 cc Omnipaque 300.
TECHNIQUE: Multidetector CT imaging of the pelvis was performed following the standard protocol during bolus administration of intravenous contrast.

[Series 2: abd/pelv with 5.0 b31f st · axial · 0.79mm/px · z∈[+826,+1246]mm · 14 of 96 slices shown, 16 images]
[im 6/96  soft-tissue]
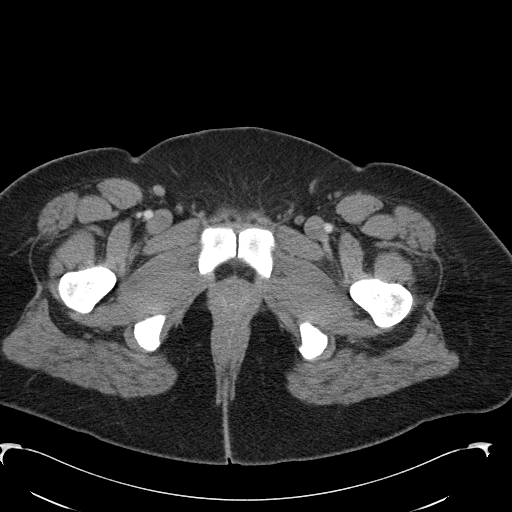
[im 6/96  bone]
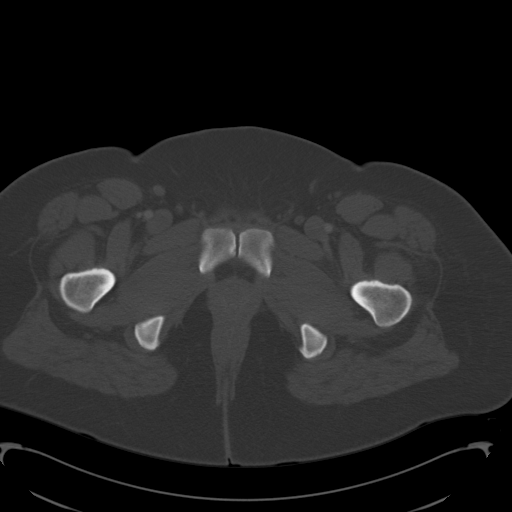
[im 12/96  soft-tissue]
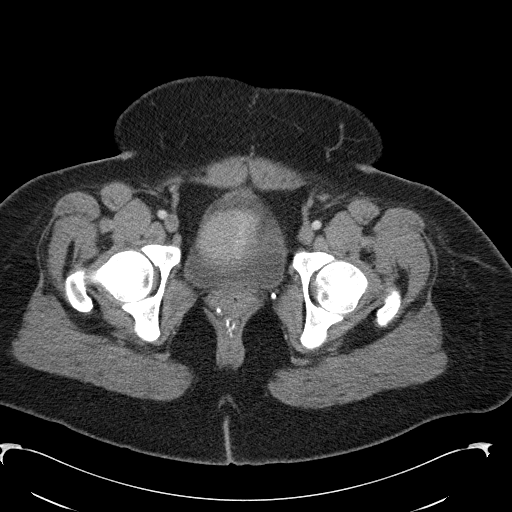
[im 18/96  soft-tissue]
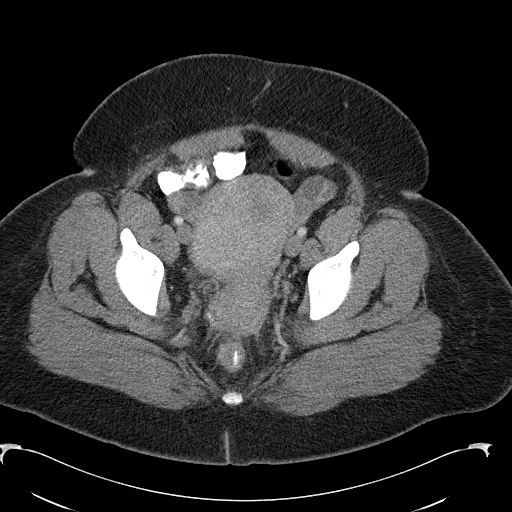
[im 24/96  soft-tissue]
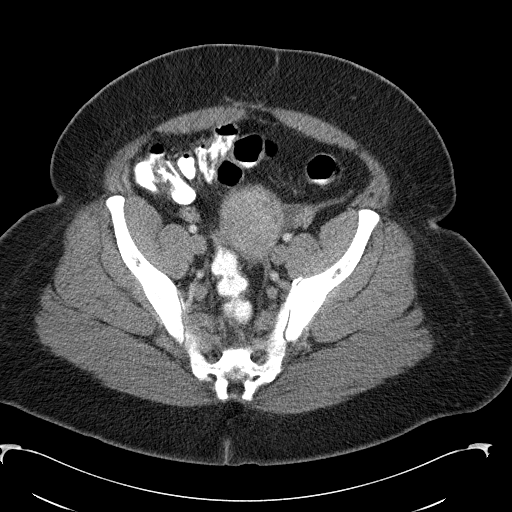
[im 30/96  soft-tissue]
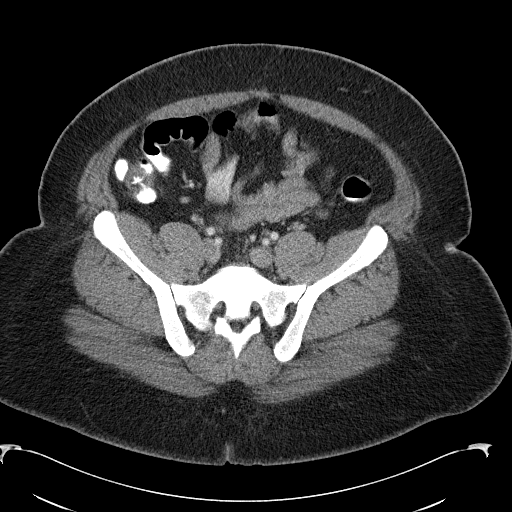
[im 36/96  soft-tissue]
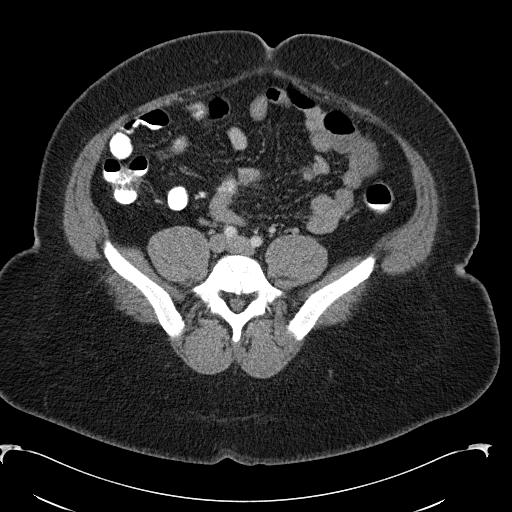
[im 42/96  soft-tissue]
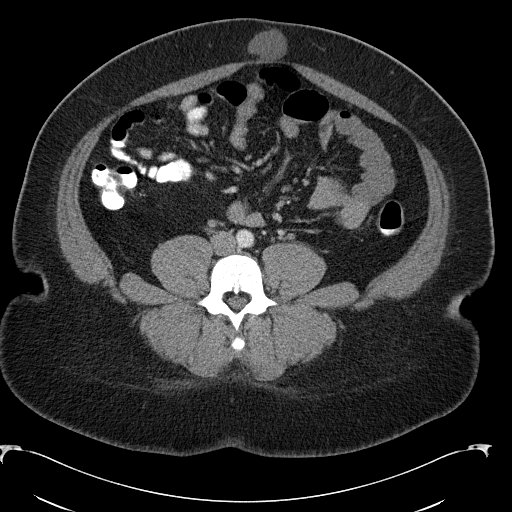
[im 54/96  soft-tissue]
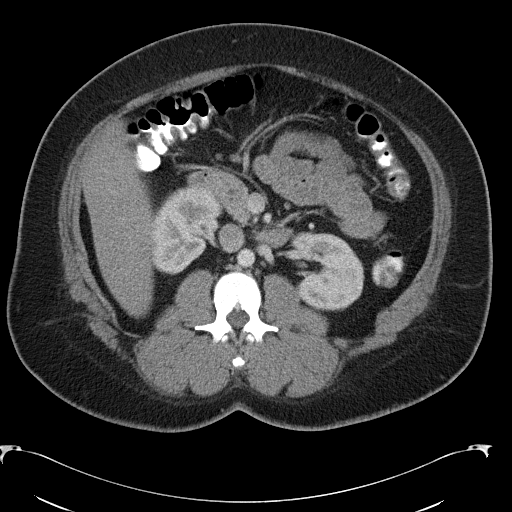
[im 60/96  soft-tissue]
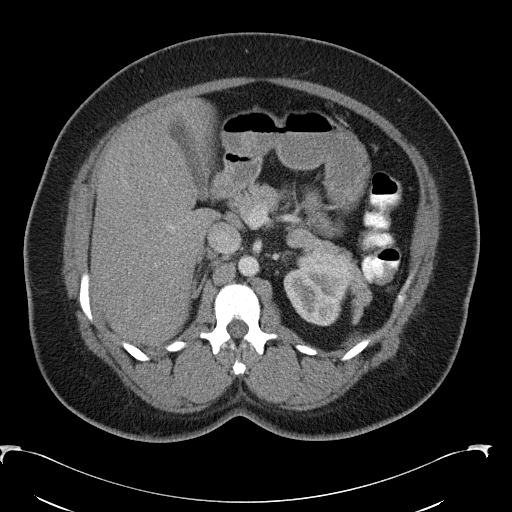
[im 60/96  bone]
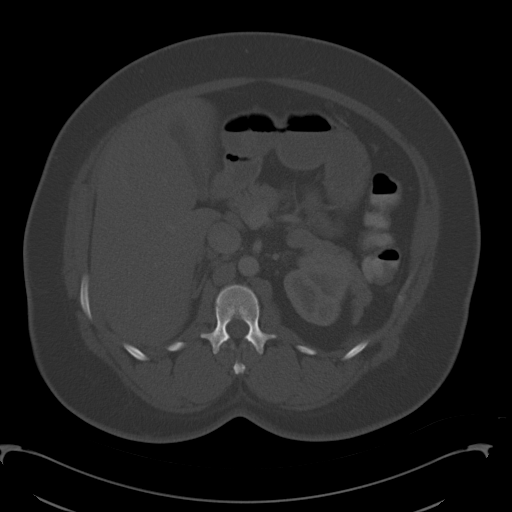
[im 66/96  soft-tissue]
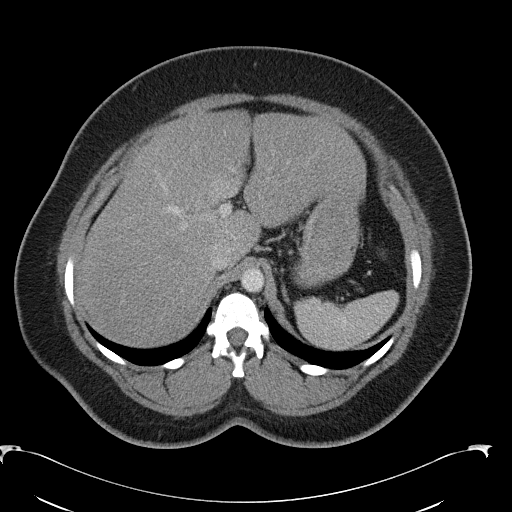
[im 72/96  soft-tissue]
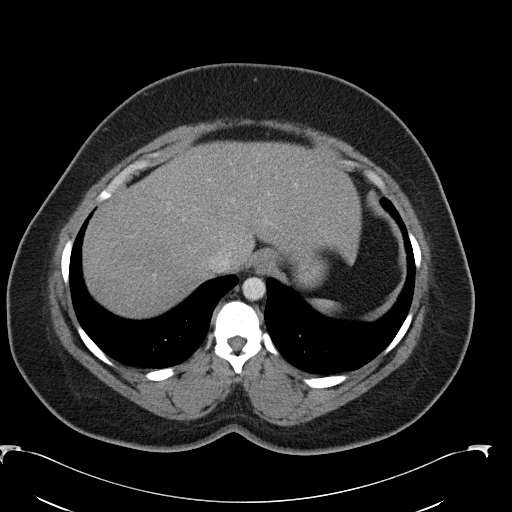
[im 78/96  soft-tissue]
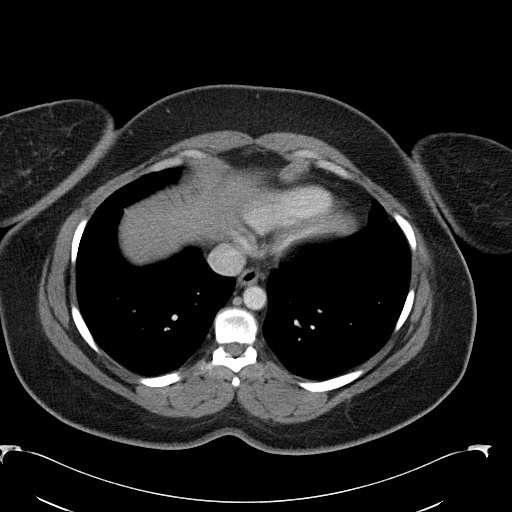
[im 84/96  soft-tissue]
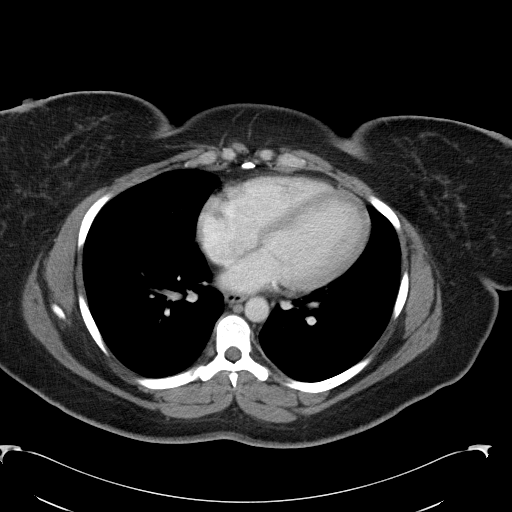
[im 90/96  soft-tissue]
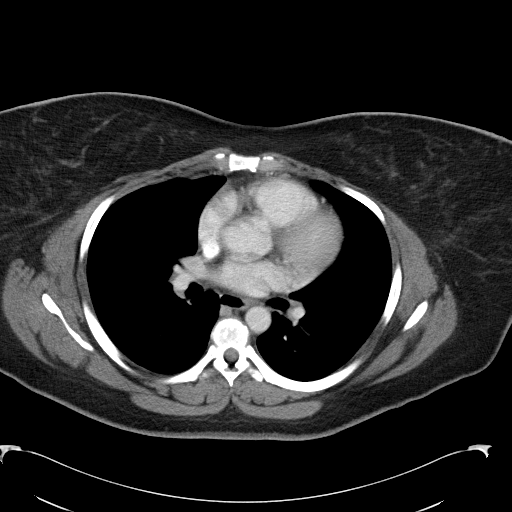

[Series 5: abd/pelv with 2.0 spo cor st · coronal · 0.93mm/px · 3 of 162 slices shown]
[im 54/162  soft-tissue]
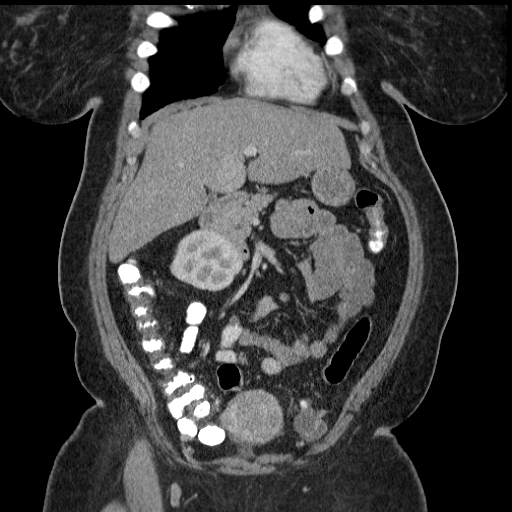
[im 72/162  soft-tissue]
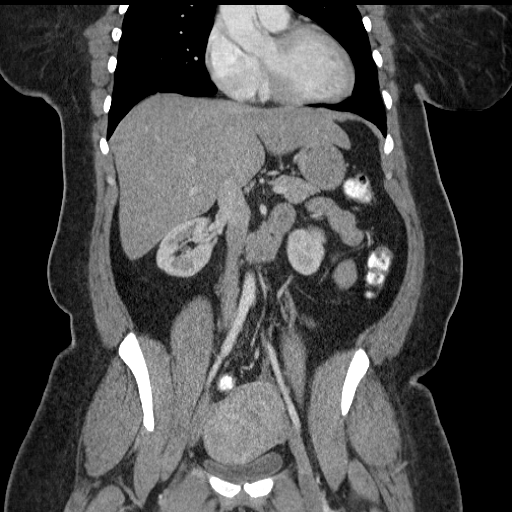
[im 90/162  soft-tissue]
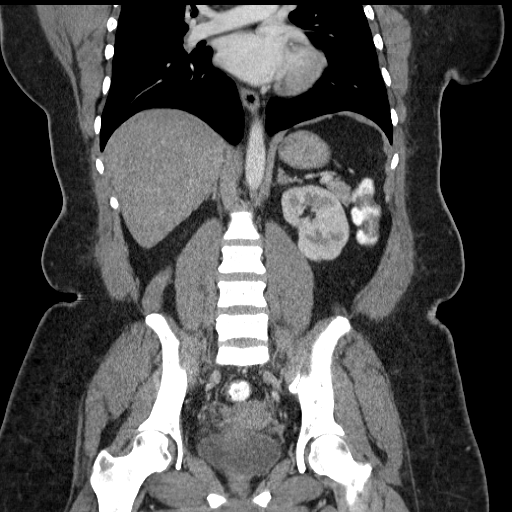

[17 of 46 positions shown; findings below may reference images not displayed]

FINDINGS: Lung bases are clear.  No pleural or pericardial effusion.  
 The liver, gallbladder, biliary tree, adrenal glands, kidneys, stomach, and small bowel are all unremarkable.  No hernia is identified.  Just superior to the umbilicus, there is a low attenuation collection measuring 4.0 cm craniocaudal x 2.5 cm AP x approximately 3.3 cm transverse which may represent a sebaceous cyst.  No abdominal lymphadenopathy or intraabdominal fluid collection is identified.  There is no focal bony abnormality.
IMPRESSION: 1.  No acute finding.
 2.  Subcutaneous fluid collection just above the umbilicus as detailed above with appearances most compatible with a sebaceous cyst.
 PELVIS CT WITH CONTRAST:
FINDINGS: The uterus is enlarged consistent with fibroid change.  The adnexa are unremarkable.  The colon has a normal CT appearance.  The appendix is not discretely visualized, but there is no inflammatory change seen in the right lower quadrant.  No fluid collection or lymphadenopathy.  No hernia.  Bones appear normal.
IMPRESSION: Fibroid uterus.

## 2007-12-31 ENCOUNTER — Ambulatory Visit (HOSPITAL_COMMUNITY): Admission: RE | Admit: 2007-12-31 | Discharge: 2007-12-31 | Payer: Self-pay | Admitting: Obstetrics & Gynecology

## 2008-02-03 ENCOUNTER — Inpatient Hospital Stay (HOSPITAL_COMMUNITY): Admission: RE | Admit: 2008-02-03 | Discharge: 2008-02-07 | Payer: Self-pay | Admitting: Obstetrics & Gynecology

## 2008-02-03 ENCOUNTER — Encounter: Payer: Self-pay | Admitting: Obstetrics & Gynecology

## 2008-05-31 ENCOUNTER — Other Ambulatory Visit: Payer: Self-pay | Admitting: Emergency Medicine

## 2008-05-31 ENCOUNTER — Inpatient Hospital Stay (HOSPITAL_COMMUNITY): Admission: AD | Admit: 2008-05-31 | Discharge: 2008-06-01 | Payer: Self-pay | Admitting: *Deleted

## 2008-05-31 ENCOUNTER — Ambulatory Visit: Payer: Self-pay | Admitting: *Deleted

## 2008-09-21 ENCOUNTER — Emergency Department (HOSPITAL_COMMUNITY): Admission: EM | Admit: 2008-09-21 | Discharge: 2008-09-21 | Payer: Self-pay | Admitting: Emergency Medicine

## 2008-12-05 ENCOUNTER — Emergency Department (HOSPITAL_COMMUNITY): Admission: EM | Admit: 2008-12-05 | Discharge: 2008-12-05 | Payer: Self-pay | Admitting: Family Medicine

## 2009-06-21 ENCOUNTER — Emergency Department (HOSPITAL_COMMUNITY): Admission: EM | Admit: 2009-06-21 | Discharge: 2009-06-21 | Payer: Self-pay | Admitting: Emergency Medicine

## 2009-09-05 ENCOUNTER — Emergency Department (HOSPITAL_COMMUNITY): Admission: EM | Admit: 2009-09-05 | Discharge: 2009-09-05 | Payer: Self-pay | Admitting: Emergency Medicine

## 2010-01-19 IMAGING — CR DG LUMBAR SPINE COMPLETE 4+V
5 series · 5 of 5 positions shown · non-contrast
Comparison: None

CLINICAL DATA: Injured in [REDACTED] with low back pain since

LUMBAR SPINE - COMPLETE 4+ VIEW

[t l-spine a.p.]
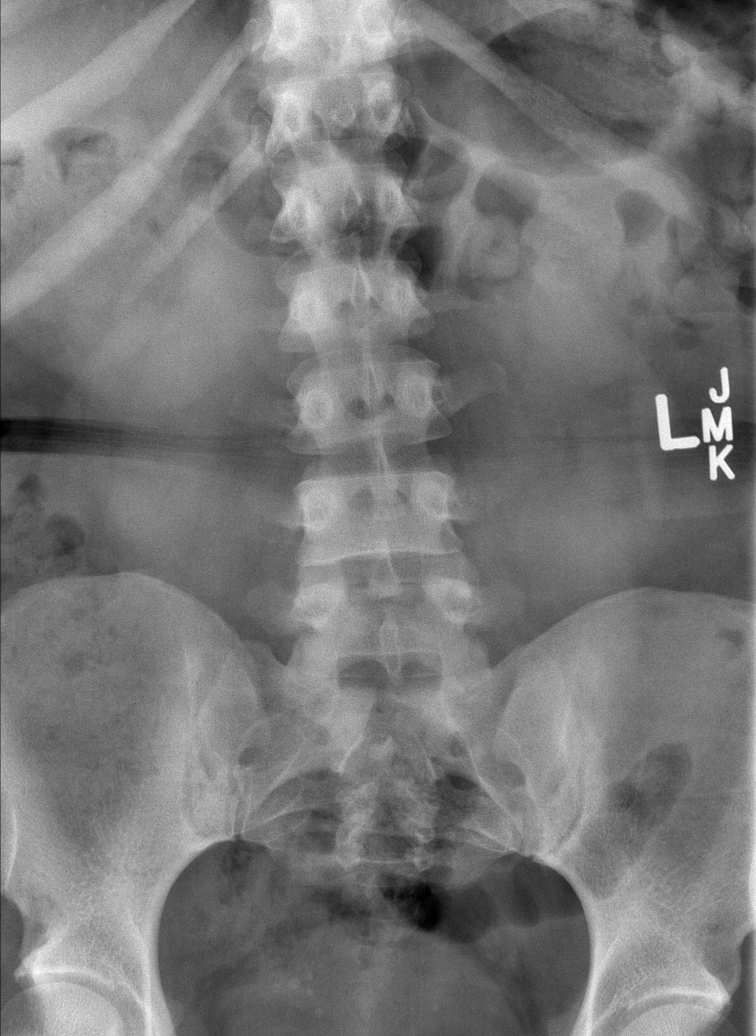

[t l-spine oblique exposure (1 of 2)]
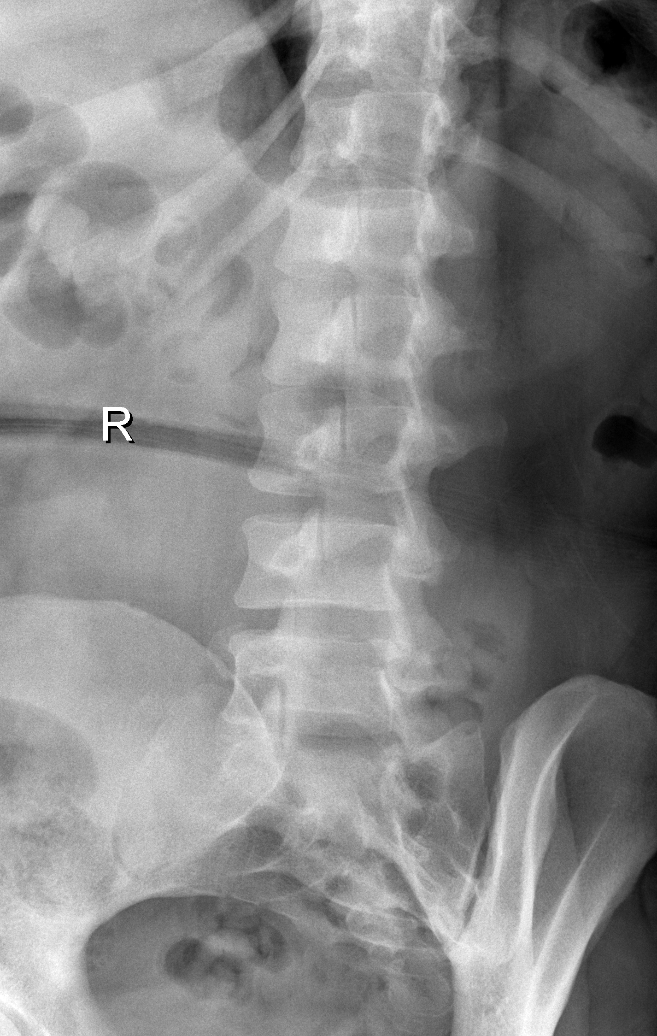

[t l-spine oblique exposure (2 of 2)]
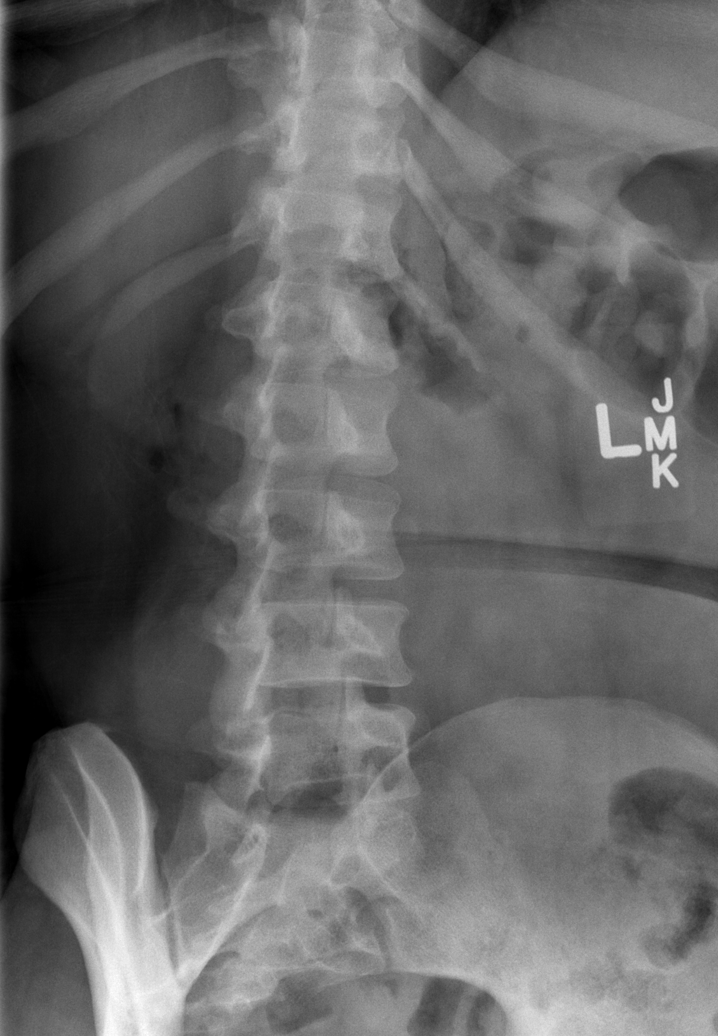

[t l-spine lat *]
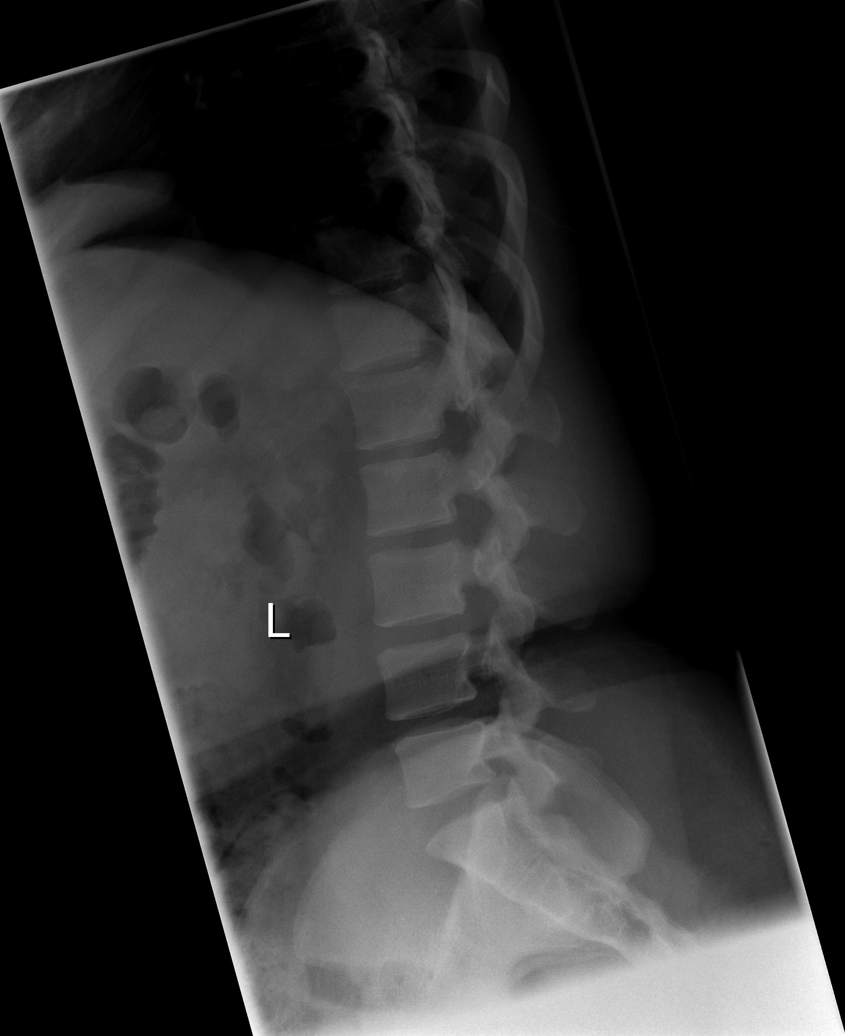

[t l-spine l5-s1 spot]
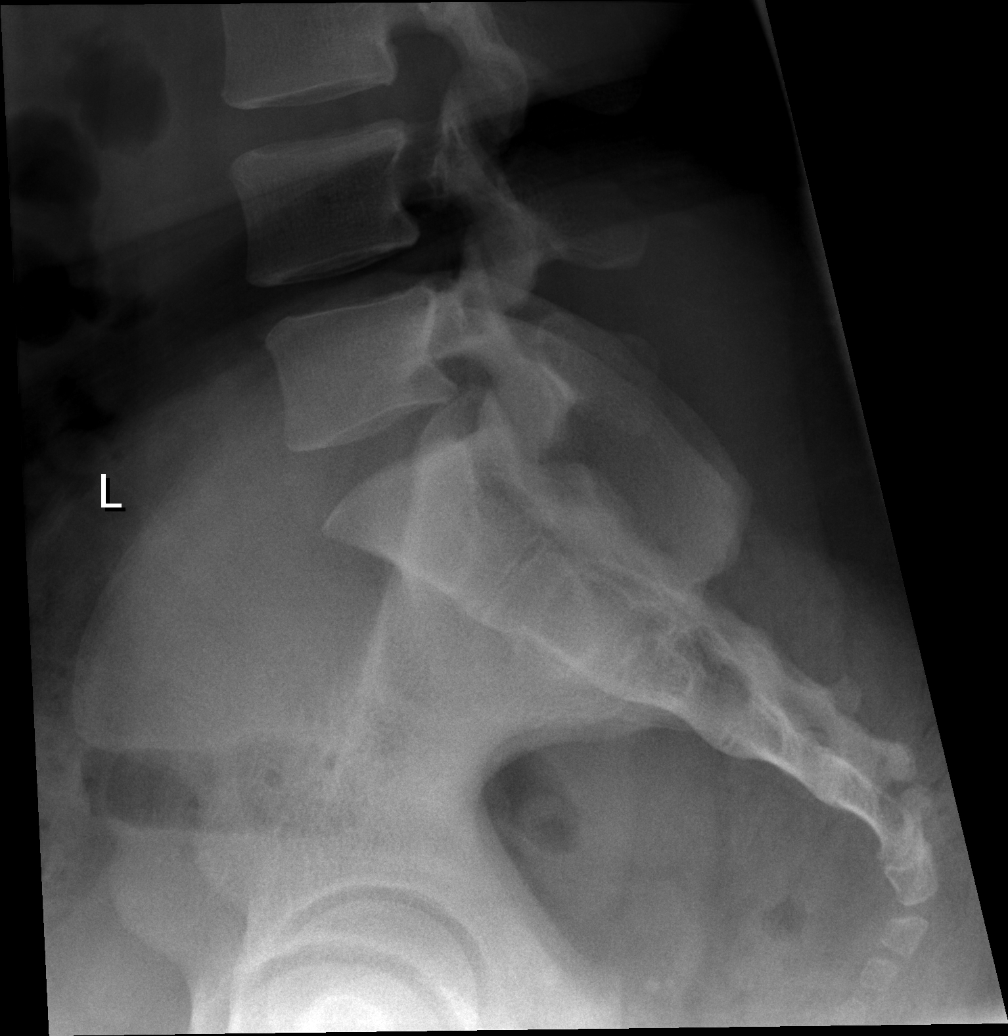

[5 of 5 positions shown; findings below may reference images not displayed]

FINDINGS: The lumbar vertebrae are straightened in alignment.
Intervertebral disc spaces are normal.  No compression deformity is
seen.  The SI joints appear normal.
IMPRESSION: Straightened alignment.  No acute bony abnormality.

## 2010-03-05 ENCOUNTER — Emergency Department (HOSPITAL_COMMUNITY): Admission: EM | Admit: 2010-03-05 | Discharge: 2010-03-05 | Payer: Self-pay | Admitting: Emergency Medicine

## 2010-04-05 IMAGING — CT CT PELVIS W/ CM
1 of 2 series · 15 of 32 positions shown, 19 images · IV contrast (100 ML OMNI 300)
Comparison: 05/24/2007

CLINICAL DATA: Painful knot in the left groin region.

CT PELVIS WITH CONTRAST
TECHNIQUE: Multidetector CT imaging of the pelvis was performed
using the standard protocol following the bolus administration of
intravenous contrast.
Contrast:  100 ml Omnipaque 300 IV.

[Series 3: recon 2: routine pelvis · axial · 0.81mm/px · z∈[-362,-130]mm · 15 of 417 slices shown, 19 images]
[im 30/417  soft-tissue]
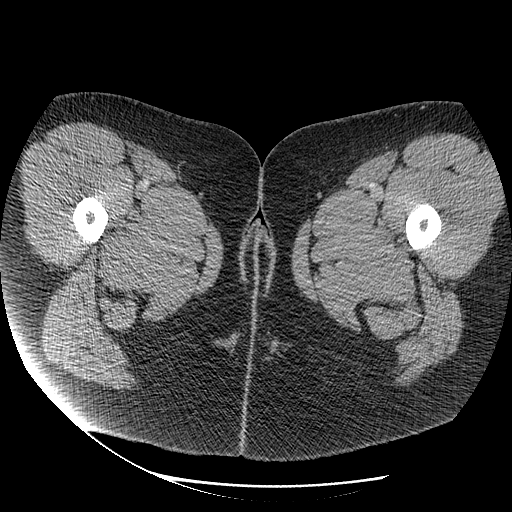
[im 30/417  bone]
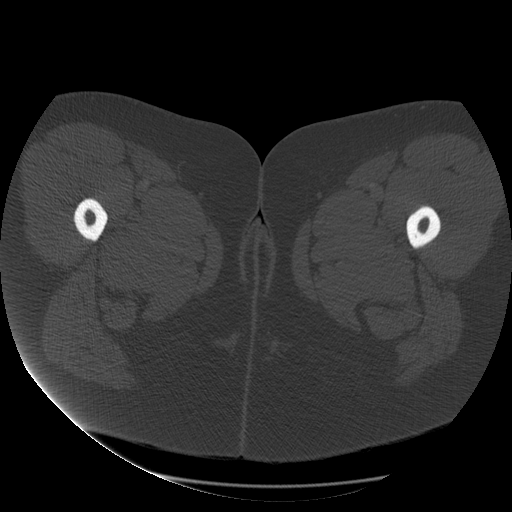
[im 60/417  soft-tissue]
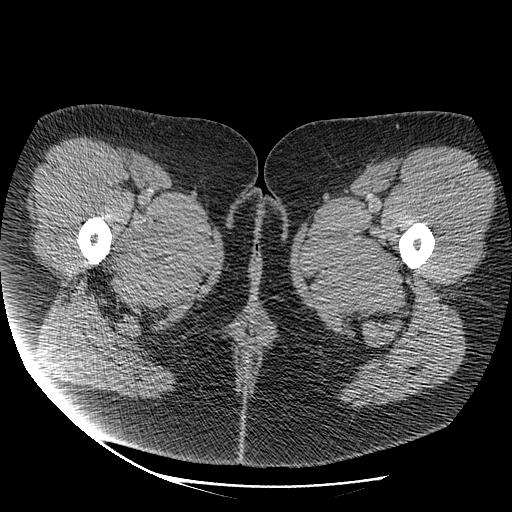
[im 90/417  soft-tissue]
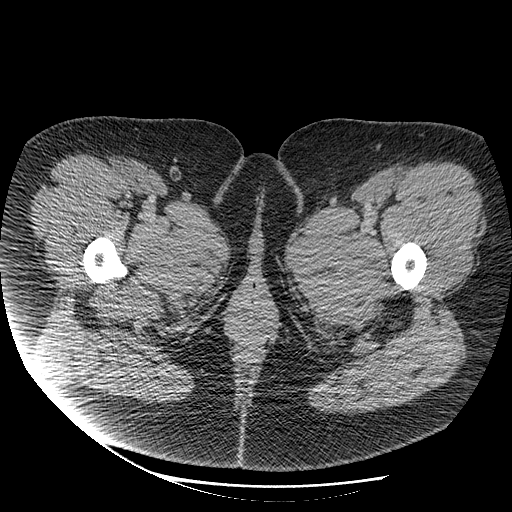
[im 119/417  soft-tissue]
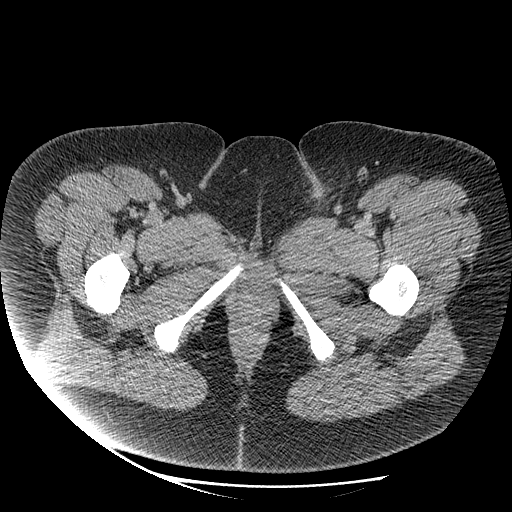
[im 149/417  soft-tissue]
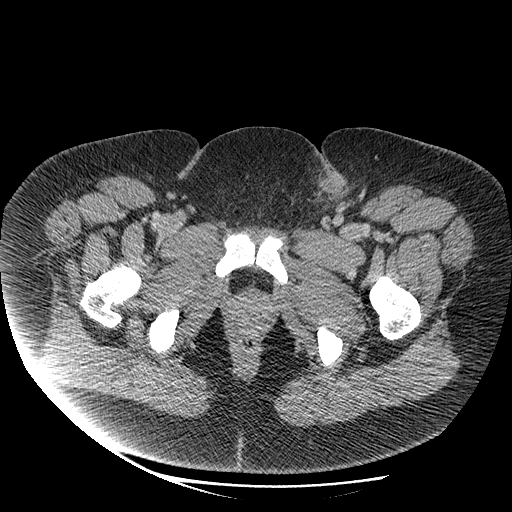
[im 179/417  soft-tissue]
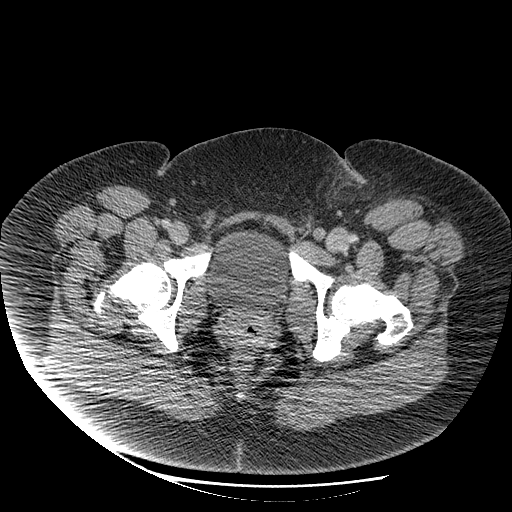
[im 209/417  soft-tissue]
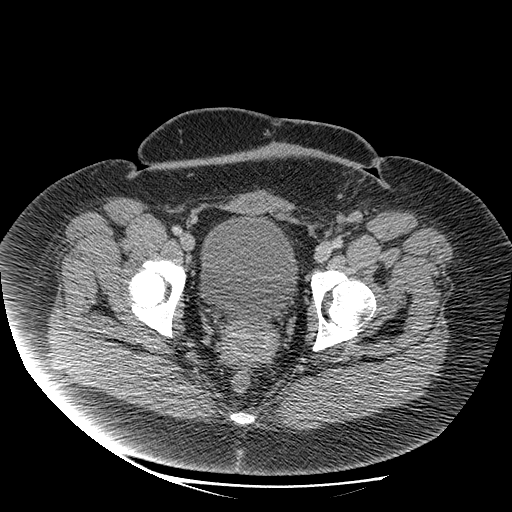
[im 238/417  soft-tissue]
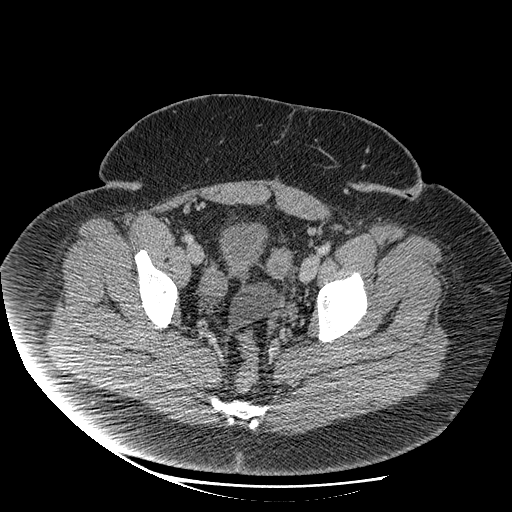
[im 268/417  soft-tissue]
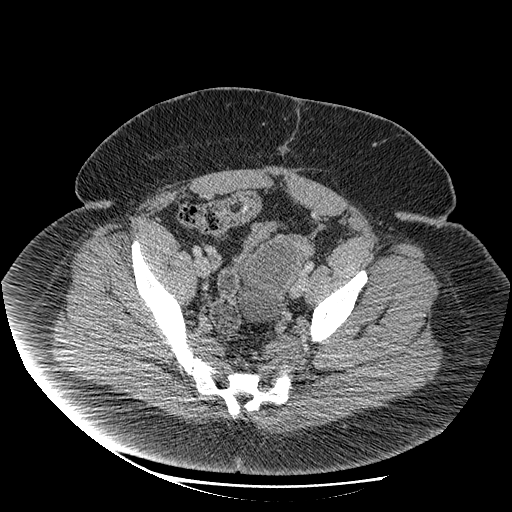
[im 268/417  bone]
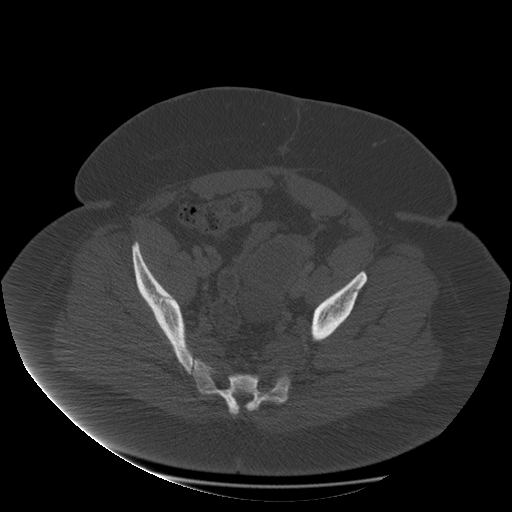
[im 298/417  soft-tissue]
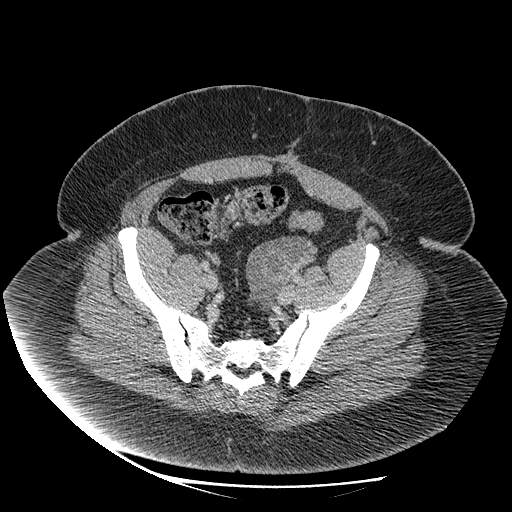
[im 327/417  soft-tissue]
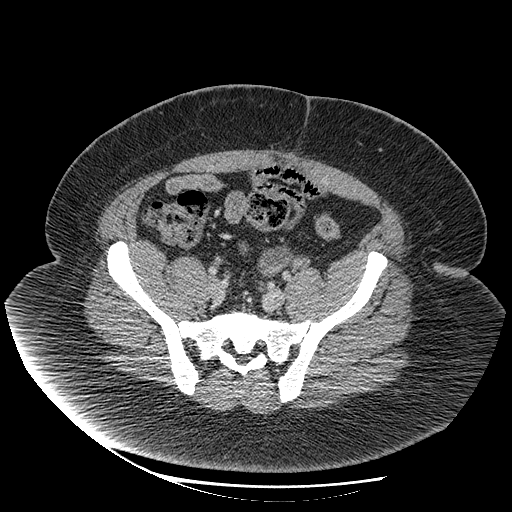
[im 357/417  soft-tissue]
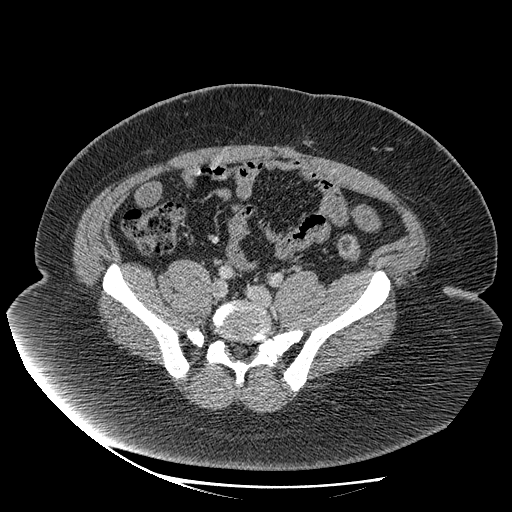
[im 357/417  lung]
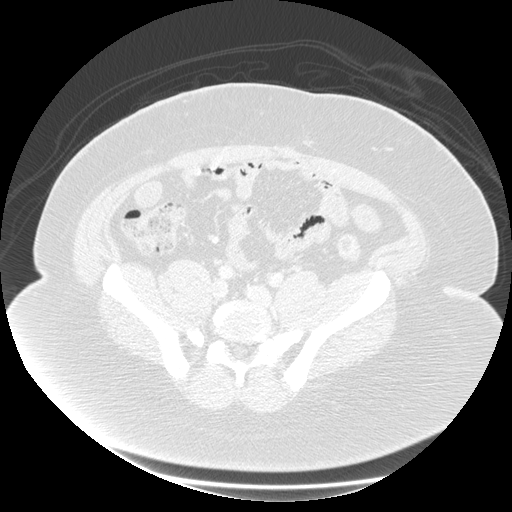
[im 372/417  lung]
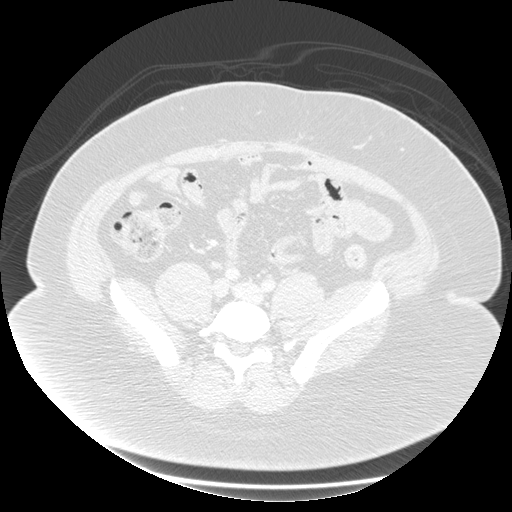
[im 387/417  soft-tissue]
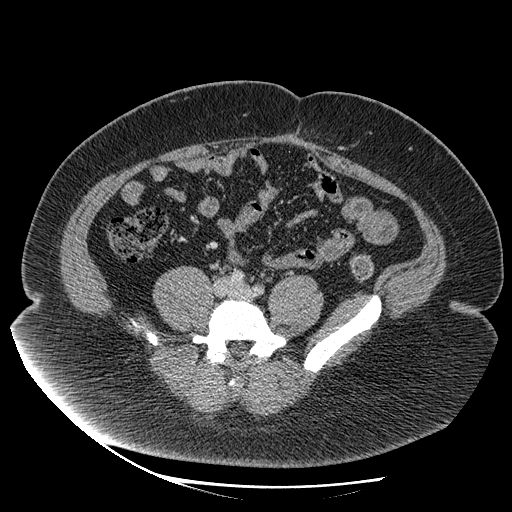
[im 387/417  lung]
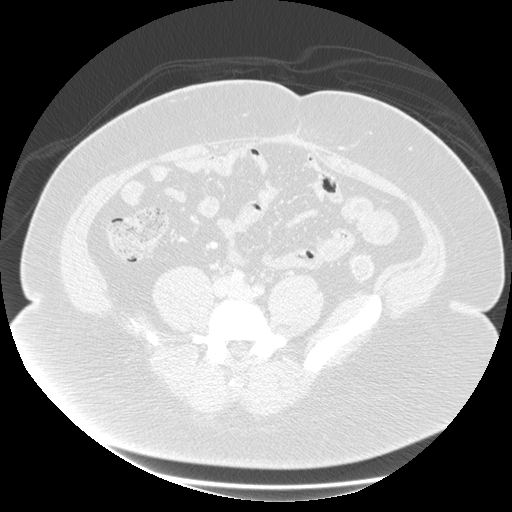
[im 402/417  lung]
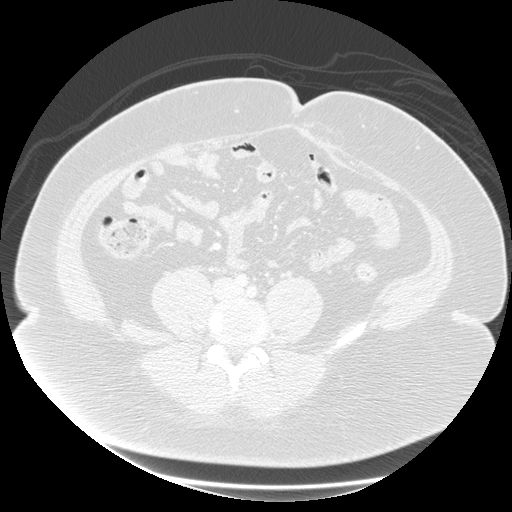

[15 of 32 positions shown; findings below may reference images not displayed]

FINDINGS: Tiny left anterior abdominal wall hernia, just to the
left of the umbilicus, containing fat.  This is new since prior
study.  Pelvic large and small bowel grossly unremarkable.

Complex cystic process noted within the left ovary with multiple
cysts present.  The largest measures up to 4.1 cm.  A component
this appears tubular may represent hydrosalpinx.  Prior
hysterectomy.  Right ovary unremarkable.  Bladder unremarkable.

No acute bony abnormality.

Within the left groin skin fold, there is skin thickening and
underlying stranding/inflammatory change.  This could represent
area of cellulitis.  There appears to be an area of central low
density near the skin surface which could reflect a small
developing abscess.  This measures maximally 17 mm.  Small,
reactive left inguinal lymph nodes present.
IMPRESSION: Skin thickening and underlying inflammatory stranding noted along
the left inguinal skin fold suggests cellulitis.  Small underlying
hypodensity along the skin surface.  This may reflect small
developing abscess.

Multiple cystic areas within the left adnexa, possibly a
combination of ovarian cysts and hydrosalpinx.  This could be
followed with pelvic ultrasound in 6-8 weeks.

Prior hysterectomy.

## 2010-08-10 ENCOUNTER — Emergency Department (HOSPITAL_COMMUNITY): Admission: EM | Admit: 2010-08-10 | Discharge: 2010-08-11 | Payer: Self-pay | Admitting: Emergency Medicine

## 2010-10-03 IMAGING — CR DG CHEST 2V
2 series · 2 of 2 positions shown · non-contrast
Comparison: 06/16/2005

CLINICAL DATA: Chest pain

CHEST - 2 VIEW

[view not recorded (1 of 2)]
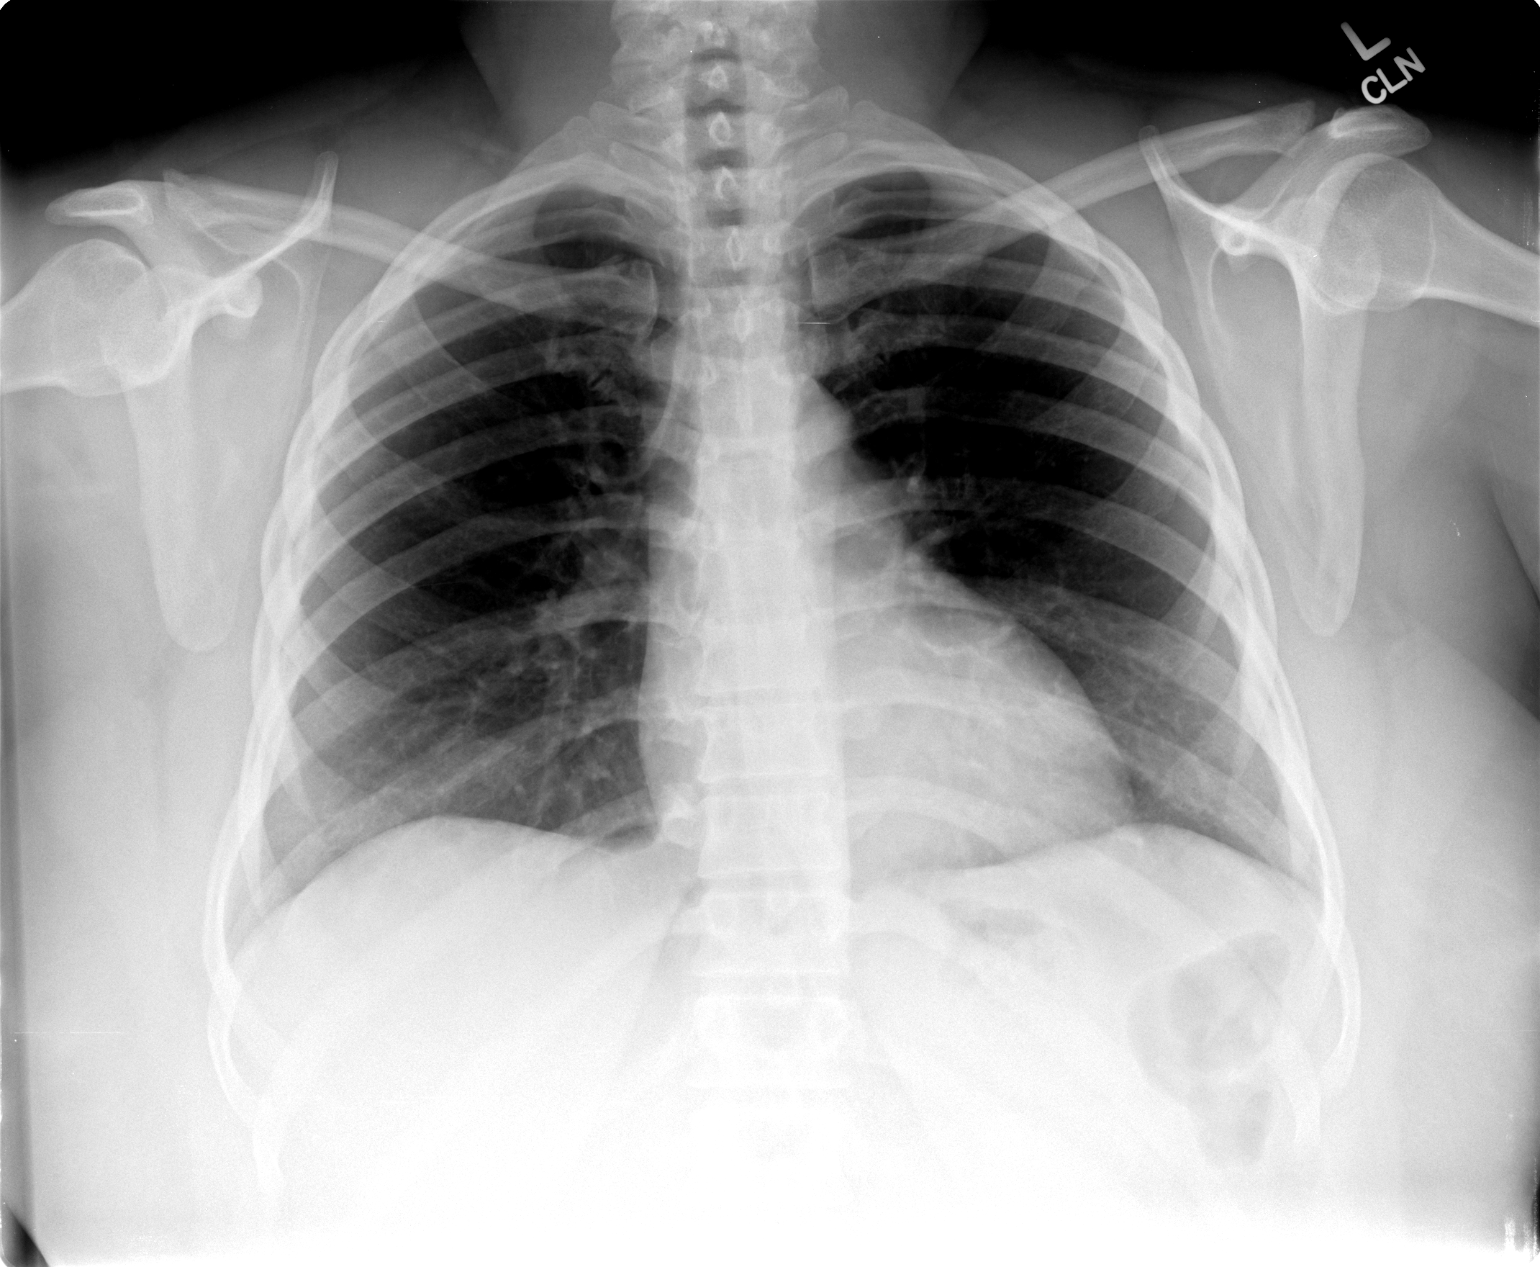

[view not recorded (2 of 2)]
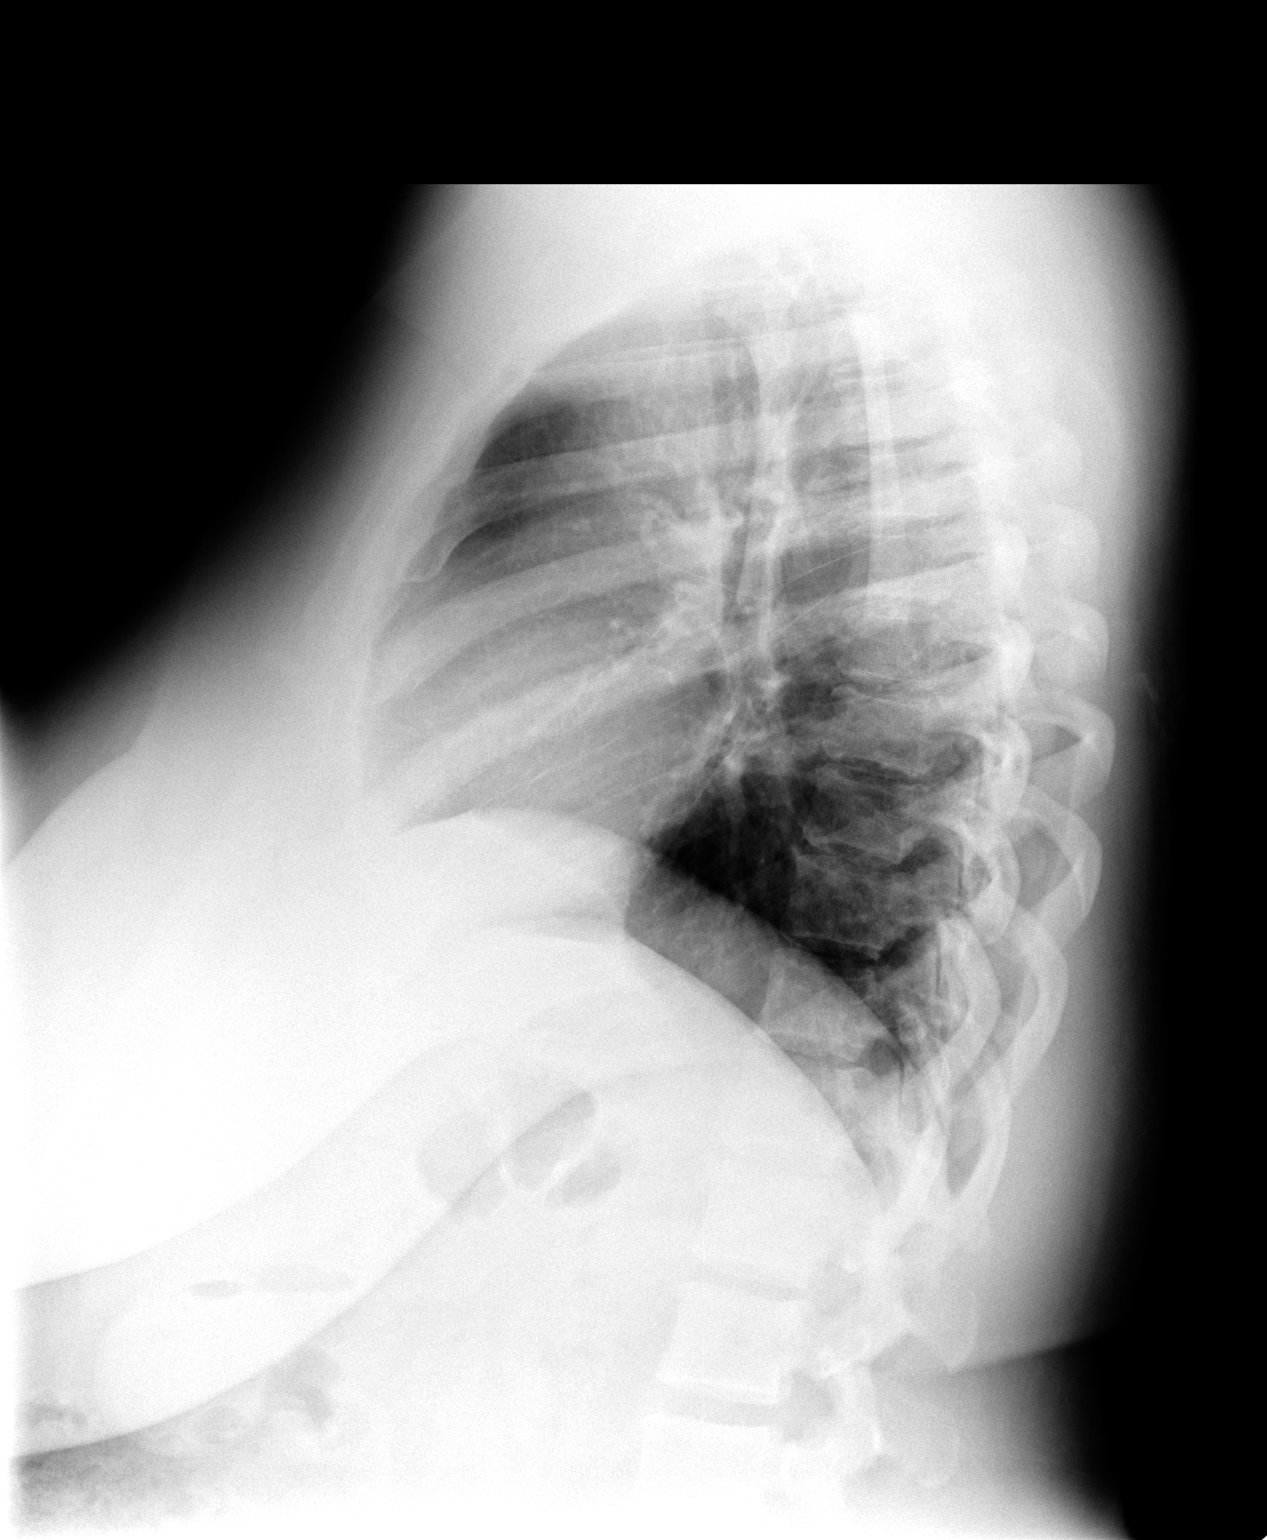

[2 of 2 positions shown; findings below may reference images not displayed]

FINDINGS: Cardiomediastinal silhouette is within normal limits. The
lungs are clear. No pleural effusion.  No pneumothorax.  No acute
osseous abnormality.
IMPRESSION: Normal exam.

## 2010-10-23 ENCOUNTER — Other Ambulatory Visit: Payer: Self-pay | Admitting: Obstetrics & Gynecology

## 2010-10-23 DIAGNOSIS — Z1231 Encounter for screening mammogram for malignant neoplasm of breast: Secondary | ICD-10-CM

## 2010-10-23 DIAGNOSIS — Z1239 Encounter for other screening for malignant neoplasm of breast: Secondary | ICD-10-CM

## 2010-10-28 ENCOUNTER — Encounter (HOSPITAL_BASED_OUTPATIENT_CLINIC_OR_DEPARTMENT_OTHER)
Admission: RE | Admit: 2010-10-28 | Discharge: 2010-10-28 | Disposition: A | Discharge status: Home / Self Care | Payer: Medicaid Other | Source: Ambulatory Visit | Attending: Orthopedic Surgery | Admitting: Orthopedic Surgery

## 2010-10-28 DIAGNOSIS — Z01812 Encounter for preprocedural laboratory examination: Secondary | ICD-10-CM | POA: Insufficient documentation

## 2010-10-28 LAB — BASIC METABOLIC PANEL
BUN: 9 mg/dL (ref 6–23)
CO2: 27 mEq/L (ref 19–32)
Calcium: 9.1 mg/dL (ref 8.4–10.5)
Chloride: 106 mEq/L (ref 96–112)
Creatinine, Ser: 0.89 mg/dL (ref 0.4–1.2)
GFR calc Af Amer: >60 mL/min (ref >60)
GFR calc non Af Amer: >60 mL/min (ref >60)
Glucose, Bld: 122 mg/dL — ABNORMAL HIGH (ref 70–99)
Potassium: 3.8 mEq/L (ref 3.5–5.1)
Sodium: 141 mEq/L (ref 135–145)

## 2010-10-29 ENCOUNTER — Ambulatory Visit (HOSPITAL_BASED_OUTPATIENT_CLINIC_OR_DEPARTMENT_OTHER)
Admission: RE | Admit: 2010-10-29 | Discharge: 2010-10-29 | Disposition: A | Discharge status: Home / Self Care | Payer: Medicaid Other | Attending: Orthopedic Surgery | Admitting: Orthopedic Surgery

## 2010-10-29 DIAGNOSIS — G5600 Carpal tunnel syndrome, unspecified upper limb: Secondary | ICD-10-CM | POA: Insufficient documentation

## 2010-10-29 LAB — GLUCOSE, CAPILLARY: Glucose-Capillary: 108 mg/dL — ABNORMAL HIGH (ref 70–99)

## 2010-10-30 ENCOUNTER — Ambulatory Visit (HOSPITAL_COMMUNITY): Payer: Self-pay

## 2010-11-04 ENCOUNTER — Inpatient Hospital Stay (HOSPITAL_COMMUNITY): Admission: RE | Admit: 2010-11-04 | Payer: Self-pay | Source: Ambulatory Visit

## 2010-11-08 ENCOUNTER — Encounter (HOSPITAL_COMMUNITY): Payer: Self-pay

## 2010-11-08 ENCOUNTER — Ambulatory Visit (HOSPITAL_COMMUNITY)
Admission: RE | Admit: 2010-11-08 | Discharge: 2010-11-08 | Disposition: A | Discharge status: Home / Self Care | Payer: Medicaid Other | Source: Ambulatory Visit | Attending: Obstetrics & Gynecology | Admitting: Obstetrics & Gynecology

## 2010-11-08 DIAGNOSIS — Z1231 Encounter for screening mammogram for malignant neoplasm of breast: Secondary | ICD-10-CM | POA: Insufficient documentation

## 2010-11-11 NOTE — Op Note (Signed)
  [name], [name]             ACCOUNT NO.:  [id number]  MEDICAL RECORD NO.:  [id number]           PATIENT TYPE:  LOCATION:                                 FACILITY:  PHYSICIAN:  Cindee Salt, M.D.       DATE OF BIRTH:  May 10, 1971  DATE OF PROCEDURE:  10/29/2010 DATE OF DISCHARGE:                              OPERATIVE REPORT   PREOPERATIVE DIAGNOSIS:  Carpal tunnel syndrome, right hand.  POSTOPERATIVE DIAGNOSIS:  Carpal tunnel syndrome, right hand.  OPERATION:  Decompression right median nerve.  SURGEON:  Cindee Salt, MD  ANESTHESIA:  General with local.  ANESTHESIOLOGIST:  Zenon Mayo, MD  HISTORY:  The patient is a 40 year old female with a history of carpal tunnel syndrome, EMG nerve conductions positive.  This has not responded to conservative treatment.  She has elected to undergo surgical decompression.  Pre, peri, postoperative course have been discussed along with risks and complications.  She is aware that there is no guarantee with the surgery, possibility of infection, recurrence of injury to arteries, nerves, tendons, incomplete relief of symptoms, dystrophy.  In the preoperative area, the patient is seen, the extremity marked by both the patient and surgeon, antibiotic given.  PROCEDURE:  The patient was brought to the operating room where a general anesthetic was carried out without difficulty.  She was prepped using ChloraPrep, supine position, right arm free.  A 3-minute dry time was allowed, time-out taken, confirming the patient and procedure.  A longitudinal incision was made in the palm, carried down through subcutaneous tissue.  Bleeders were electrocauterized with bipolar. Palmar fascia was split, superficial palmar arch identified.  The flexor tendon to the ring and little finger identified to the ulnar side of the median nerve.  The carpal retinaculum was incised with sharp dissection. Right angle and Sewall retractor were placed between  skin and forearm fascia.  Fascia was released for approximately a centimeter and a half proximal to the wrist crease under direct vision.  Canal was explored. Area of compression to the nerve was immediately apparent.  No further lesions were identified.  The wound was irrigated.  Skin closed with interrupted 5-0 Vicryl Rapide sutures.  A local infiltration of 0.25% Marcaine without epinephrine was given.  A compressive dressing and splint to the wrist and fingers free was applied.  On deflation of the tourniquet, all fingers immediately pinked.  She was taken to the recovery room for observation in satisfactory condition.  She will be discharged home to return to the [facility] in 1 week on Vicodin.    ______________________________ Cindee Salt, M.D.   ______________________________ Cindee Salt, M.D.    GK/MEDQ  D:  10/29/2010  T:  10/29/2010  Job:  [id number]  Electronically Signed by Cindee Salt M.D. on 11/11/2010 02:25:14 PM

## 2010-11-21 ENCOUNTER — Ambulatory Visit: Payer: Medicaid Other | Attending: Orthopedic Surgery | Admitting: Occupational Therapy

## 2010-11-21 DIAGNOSIS — M6281 Muscle weakness (generalized): Secondary | ICD-10-CM | POA: Insufficient documentation

## 2010-11-21 DIAGNOSIS — M25549 Pain in joints of unspecified hand: Secondary | ICD-10-CM | POA: Insufficient documentation

## 2010-11-21 DIAGNOSIS — M25649 Stiffness of unspecified hand, not elsewhere classified: Secondary | ICD-10-CM | POA: Insufficient documentation

## 2010-11-21 DIAGNOSIS — IMO0001 Reserved for inherently not codable concepts without codable children: Secondary | ICD-10-CM | POA: Insufficient documentation

## 2010-11-28 ENCOUNTER — Ambulatory Visit: Payer: Medicaid Other | Admitting: Occupational Therapy

## 2010-12-03 ENCOUNTER — Ambulatory Visit: Payer: Medicaid Other | Admitting: Occupational Therapy

## 2010-12-03 LAB — DIFFERENTIAL
Basophils Absolute: 0 10*3/uL (ref 0.0–0.1)
Basophils Relative: 0 % (ref 0–1)
Eosinophils Absolute: 0.1 10*3/uL (ref 0.0–0.7)
Eosinophils Relative: 1 % (ref 0–5)
Lymphocytes Relative: 47 % — ABNORMAL HIGH (ref 12–46)
Lymphs Abs: 4.3 10*3/uL — ABNORMAL HIGH (ref 0.7–4.0)
Monocytes Absolute: 0.7 10*3/uL (ref 0.1–1.0)
Monocytes Relative: 8 % (ref 3–12)
Neutro Abs: 4 10*3/uL (ref 1.7–7.7)
Neutrophils Relative %: 44 % (ref 43–77)

## 2010-12-03 LAB — BASIC METABOLIC PANEL
BUN: 13 mg/dL (ref 6–23)
CO2: 19 mEq/L (ref 19–32)
Calcium: 8.7 mg/dL (ref 8.4–10.5)
Chloride: 112 mEq/L (ref 96–112)
Creatinine, Ser: 1.13 mg/dL (ref 0.4–1.2)
GFR calc Af Amer: >60 mL/min (ref >60)
GFR calc non Af Amer: 53 mL/min — ABNORMAL LOW (ref >60)
Glucose, Bld: 129 mg/dL — ABNORMAL HIGH (ref 70–99)
Potassium: 3.1 mEq/L — ABNORMAL LOW (ref 3.5–5.1)
Sodium: 138 mEq/L (ref 135–145)

## 2010-12-03 LAB — CBC
HCT: 33.7 % — ABNORMAL LOW (ref 36.0–46.0)
Hemoglobin: 11.2 g/dL — ABNORMAL LOW (ref 12.0–15.0)
MCH: 28.4 pg (ref 26.0–34.0)
MCHC: 33.2 g/dL (ref 30.0–36.0)
MCV: 85.5 fL (ref 78.0–100.0)
Platelets: 288 10*3/uL (ref 150–400)
RBC: 3.94 MIL/uL (ref 3.87–5.11)
RDW: 14.4 % (ref 11.5–15.5)
WBC: 9.2 10*3/uL (ref 4.0–10.5)

## 2010-12-05 ENCOUNTER — Other Ambulatory Visit (HOSPITAL_COMMUNITY): Payer: Self-pay | Admitting: Pulmonary Disease

## 2010-12-05 DIAGNOSIS — R110 Nausea: Secondary | ICD-10-CM

## 2010-12-09 LAB — COMPREHENSIVE METABOLIC PANEL
BUN: 19 mg/dL (ref 6–23)
CO2: 22 mEq/L (ref 19–32)
Calcium: 8.9 mg/dL (ref 8.4–10.5)
Creatinine, Ser: 1.21 mg/dL — ABNORMAL HIGH (ref 0.4–1.2)
GFR calc Af Amer: 60 mL/min — ABNORMAL LOW (ref >60)
GFR calc non Af Amer: 49 mL/min — ABNORMAL LOW (ref >60)
Glucose, Bld: 106 mg/dL — ABNORMAL HIGH (ref 70–99)

## 2010-12-09 LAB — CBC
Hemoglobin: 13.2 g/dL (ref 12.0–15.0)
MCHC: 34.4 g/dL (ref 30.0–36.0)
MCV: 87.9 fL (ref 78.0–100.0)
RBC: 4.36 MIL/uL (ref 3.87–5.11)

## 2010-12-09 LAB — URINALYSIS, ROUTINE W REFLEX MICROSCOPIC
Bilirubin Urine: NEGATIVE
Glucose, UA: NEGATIVE mg/dL
Hgb urine dipstick: NEGATIVE
Ketones, ur: NEGATIVE mg/dL
Leukocytes, UA: NEGATIVE
Nitrite: NEGATIVE
Protein, ur: NEGATIVE mg/dL
Specific Gravity, Urine: 1.035 — ABNORMAL HIGH (ref 1.005–1.030)
Urobilinogen, UA: 0.2 mg/dL (ref 0.0–1.0)
pH: 5.5 (ref 5.0–8.0)

## 2010-12-09 LAB — POCT CARDIAC MARKERS
CKMB, poc: <1 ng/mL — ABNORMAL LOW (ref 1.0–8.0)
Troponin i, poc: <0.05 ng/mL (ref 0.00–0.09)

## 2010-12-09 LAB — DIFFERENTIAL
Lymphocytes Relative: 56 % — ABNORMAL HIGH (ref 12–46)
Lymphs Abs: 3.4 10*3/uL (ref 0.7–4.0)
Neutro Abs: 2.2 10*3/uL (ref 1.7–7.7)
Neutrophils Relative %: 36 % — ABNORMAL LOW (ref 43–77)

## 2010-12-09 LAB — URINE MICROSCOPIC-ADD ON

## 2010-12-09 LAB — LIPASE, BLOOD: Lipase: 30 U/L (ref 11–59)

## 2010-12-10 ENCOUNTER — Ambulatory Visit (HOSPITAL_COMMUNITY)
Admission: RE | Admit: 2010-12-10 | Discharge: 2010-12-10 | Disposition: A | Discharge status: Home / Self Care | Payer: Medicaid Other | Source: Ambulatory Visit | Attending: Pulmonary Disease | Admitting: Pulmonary Disease

## 2010-12-10 DIAGNOSIS — K7689 Other specified diseases of liver: Secondary | ICD-10-CM | POA: Insufficient documentation

## 2010-12-10 DIAGNOSIS — R110 Nausea: Secondary | ICD-10-CM

## 2010-12-12 ENCOUNTER — Ambulatory Visit: Payer: Medicaid Other | Admitting: Occupational Therapy

## 2010-12-24 LAB — DIFFERENTIAL
Basophils Absolute: 0.1 10*3/uL (ref 0.0–0.1)
Basophils Relative: 1 % (ref 0–1)
Eosinophils Relative: 1 % (ref 0–5)
Lymphocytes Relative: 35 % (ref 12–46)
Neutro Abs: 5 10*3/uL (ref 1.7–7.7)

## 2010-12-24 LAB — POCT I-STAT, CHEM 8
BUN: 13 mg/dL (ref 6–23)
Chloride: 108 mEq/L (ref 96–112)
HCT: 37 % (ref 36.0–46.0)
Sodium: 141 mEq/L (ref 135–145)
TCO2: 27 mmol/L (ref 0–100)

## 2010-12-24 LAB — CBC
MCHC: 34.3 g/dL (ref 30.0–36.0)
Platelets: 304 10*3/uL (ref 150–400)
RDW: 15.2 % (ref 11.5–15.5)

## 2011-01-27 ENCOUNTER — Encounter (HOSPITAL_BASED_OUTPATIENT_CLINIC_OR_DEPARTMENT_OTHER)
Admission: RE | Admit: 2011-01-27 | Discharge: 2011-01-27 | Disposition: A | Discharge status: Home / Self Care | Payer: Medicaid Other | Source: Ambulatory Visit | Attending: Orthopedic Surgery | Admitting: Orthopedic Surgery

## 2011-01-27 LAB — BASIC METABOLIC PANEL
CO2: 27 mEq/L (ref 19–32)
Calcium: 9.3 mg/dL (ref 8.4–10.5)
Creatinine, Ser: 0.98 mg/dL (ref 0.4–1.2)
GFR calc Af Amer: >60 mL/min (ref >60)
Sodium: 136 mEq/L (ref 135–145)

## 2011-01-28 ENCOUNTER — Ambulatory Visit (HOSPITAL_BASED_OUTPATIENT_CLINIC_OR_DEPARTMENT_OTHER)
Admission: RE | Admit: 2011-01-28 | Discharge: 2011-01-28 | Disposition: A | Discharge status: Home / Self Care | Payer: Medicaid Other | Source: Ambulatory Visit | Attending: Orthopedic Surgery | Admitting: Orthopedic Surgery

## 2011-01-28 DIAGNOSIS — E119 Type 2 diabetes mellitus without complications: Secondary | ICD-10-CM | POA: Insufficient documentation

## 2011-01-28 DIAGNOSIS — I10 Essential (primary) hypertension: Secondary | ICD-10-CM | POA: Insufficient documentation

## 2011-01-28 DIAGNOSIS — K219 Gastro-esophageal reflux disease without esophagitis: Secondary | ICD-10-CM | POA: Insufficient documentation

## 2011-01-28 DIAGNOSIS — G5600 Carpal tunnel syndrome, unspecified upper limb: Secondary | ICD-10-CM | POA: Insufficient documentation

## 2011-01-28 DIAGNOSIS — Z01812 Encounter for preprocedural laboratory examination: Secondary | ICD-10-CM | POA: Insufficient documentation

## 2011-01-28 LAB — POCT HEMOGLOBIN-HEMACUE: Hemoglobin: 13.6 g/dL (ref 12.0–15.0)

## 2011-01-28 LAB — GLUCOSE, CAPILLARY
Glucose-Capillary: 131 mg/dL — ABNORMAL HIGH (ref 70–99)
Glucose-Capillary: 140 mg/dL — ABNORMAL HIGH (ref 70–99)

## 2011-01-30 ENCOUNTER — Emergency Department (HOSPITAL_COMMUNITY)
Admission: EM | Admit: 2011-01-30 | Discharge: 2011-01-30 | Disposition: A | Discharge status: Home / Self Care | Payer: Medicaid Other | Attending: Emergency Medicine | Admitting: Emergency Medicine

## 2011-01-30 DIAGNOSIS — K029 Dental caries, unspecified: Secondary | ICD-10-CM | POA: Insufficient documentation

## 2011-01-30 DIAGNOSIS — R51 Headache: Secondary | ICD-10-CM | POA: Insufficient documentation

## 2011-01-30 DIAGNOSIS — K089 Disorder of teeth and supporting structures, unspecified: Secondary | ICD-10-CM | POA: Insufficient documentation

## 2011-01-30 DIAGNOSIS — E119 Type 2 diabetes mellitus without complications: Secondary | ICD-10-CM | POA: Insufficient documentation

## 2011-01-30 DIAGNOSIS — R110 Nausea: Secondary | ICD-10-CM | POA: Insufficient documentation

## 2011-01-30 DIAGNOSIS — I10 Essential (primary) hypertension: Secondary | ICD-10-CM | POA: Insufficient documentation

## 2011-01-30 DIAGNOSIS — H53149 Visual discomfort, unspecified: Secondary | ICD-10-CM | POA: Insufficient documentation

## 2011-01-30 DIAGNOSIS — Z79899 Other long term (current) drug therapy: Secondary | ICD-10-CM | POA: Insufficient documentation

## 2011-01-30 DIAGNOSIS — J45909 Unspecified asthma, uncomplicated: Secondary | ICD-10-CM | POA: Insufficient documentation

## 2011-02-04 NOTE — Discharge Summary (Signed)
[name], [name] NO.:  [id number]   MEDICAL RECORD NO.:  [id number]          PATIENT TYPE:  IPS   LOCATION:  0307                          FACILITY:  [facility]   PHYSICIAN:  [name], M.D. DATE OF BIRTH:  [birth year]   DATE OF ADMISSION:  05/31/2008  DATE OF DISCHARGE:                               DISCHARGE SUMMARY   This is a 40 year old female.   HISTORY OF PRESENT ILLNESS:  The patient states that she was admitted  because she panicked.  She was taking Motrin to help her out with her  headache, then got sick, had called her mother-in-law who came over, got  very concerned about the patient, had called EMS.  She states EMS cane.  They had asked her if she had been under any significant stress and was  then transported to the emergency department.  The patient does state  she has been under some stress, but denies any suicidal thoughts.  Her  children and grandchildren are her deterrent for harming herself.  She  has been sleeping satisfactorily.  Appetite has been satisfactory.  She  does state that she has been doing too much for everyone else and has  been somewhat stressed out.   PAST PSYCHIATRIC HISTORY:  First admission to the [facility].  She did see a psychiatrist when she was a child when she lost  her brother.  No current outpatient mental health treatment.   SOCIAL HISTORY:  [age]-year-old single female, has a supportive  boyfriend.  Patient is unemployed.  She has two daughters 43 an 36.  The  patient lives in [place] and also has a 57-year-old grandson.   FAMILY HISTORY:  None.   ALCOHOL AND DRUG HISTORY:  Drinks alcohol on rare occasions, denies any  drug use.  Does smoke cigarettes.  Primary care [name] is Dr.  Petra Bradley.   MEDICAL PROBLEMS:  1. Migraine headaches.  2. Hypertension.  3. Asthma.   MEDICATIONS:  Recently had her blood pressure medication changed from  Norvasc to Benicar.  No other listed  medications.   DRUG ALLERGIES:  No known allergies.   PHYSICAL EXAM:  This is an overweight female who was fully assessed at  [facility] Emergency Department.  Her physical exam was reviewed with no  significant findings.  The patient did receive Flagyl for Trichomonas  noted in the urine sample.  Temperature 98, 73 heart rate,18 respirations, blood pressure is 142/94,  95% saturated.  She is 5 feet 2 inches tall, 220 pounds.   Her salicylate level was less than 4, glucose of 110.  CBC within normal  limits.  Alcohol level less than 5.  Acetaminophen level less than 10.  Urine drug screen is negative.   MENTAL STATUS EXAM:  This is a fully alert, cooperative female,  appropriately dressed, good eye contact.  Speech is clear.  Normal pace  and tone.  The patient's mood is euthymic.  The patient is pleasant, has  a sense of humor, provides a good history.  She denies any suicidal  thoughts.  No delusional statements.  Cognitive  function intact.  Her  memory is good.  Judgment and insight appear to be good.   HOSPITAL COURSE:  The patient was admitted for concern of overdose.  The  patient was oriented to the unit and was placed in the blue group.  We  contacted her family for concerns and background.  Family had reassured  that they are very supportive and had no concerns about patient going  home today.  The patient did not feel she needed any type of therapy or  psychiatric followup at this time.  The patient was encouraged to follow  up with her family doctor for review of her medications and review of  using Motrin for headache.  The patient was in agreement to this and was  willing to call her family doctor.  The patient will be discharged.  Boyfriend will be picking the patient up.   ADMITTING DIAGNOSES:  Axis I:  Was mood disorder NOS.  Axis II:  Deferred.  Axis III:  Hypertension, migraine headaches.  Axis IV:  Problems with occupation, medical problems.  Axis V:  Current  is 45-50.   DISCHARGE DIAGNOSES:  Axis I:  Adjustment disorder.  Axis II:  Deferred.  Axis III:  Migraine headaches and hypertension.  Axis IV:  Related to occupation.  The patient is unemployed at this  time, medical problems.  Axis V:  Current is 65.   PLANS:  1. To contract for safety.  2. Again, the patient was encouraged to call her family doctor for      medical problems.  The patient had refused followup at this time.      Landry Corporal, N.P.      [name], M.D.  Electronically Signed    JO/MEDQ  D:  06/01/2008  T:  06/01/2008  Job:  [id number]

## 2011-02-04 NOTE — Discharge Summary (Signed)
NAME:  [name], [name]             ACCOUNT NO.:  [id number]   MEDICAL RECORD NO.:  [id number]          PATIENT TYPE:  INP   LOCATION:  9306                          FACILITY:  [facility]   PHYSICIAN:  Charles A. Clearance Coots, M.D.DATE OF BIRTH:  1971/06/11   DATE OF ADMISSION:  02/03/2008  DATE OF DISCHARGE:  02/07/2008                               DISCHARGE SUMMARY   ADMITTING DIAGNOSIS:  Symptomatic uterine fibroids.   DISCHARGE DIAGNOSIS:  Symptomatic uterine fibroids, status post  attempted laparoscopic hysterectomy, converted to total abdominal  hysterectomy on Feb 03, 2008.  There were no intraoperative  complications.   DISPOSITION:  The patient was discharged home on postop day #4 in good  condition.   REASON FOR ADMISSION:  A 36-year black female with symptomatic uterine  fibroids presents for attempted laparoscopic hysterectomy and possible  total abdominal hysterectomy.  The patient has a history of dysmenorrhea  and menorrhagia, symptoms are refractory to ibuprofen.  She has  completed her childbearing and status post tubal ligation.  Workup today  included hemoglobin of 10.  Coags were within normal limits.  Prolactin  and thyroid function studies were within normal limits.  Pap smear in  April 2009 was negative.  Ultrasound in April 2009 demonstrated the  uterus sagittal diameter of 10.7 cm.  There was a dominant fibroid  anteriorly that measured 4 cm.   PAST MEDICAL HISTORY:  Surgery:  1. Laparoscopic procedure to drain ovarian cyst.  2. Appendectomy  3. Tubal ligation.  4. Previous cesarean section   Illnesses:  1. Chronic hypertension.  2. Iron-deficiency anemia.  3. Hypercholesterolemia.  4. Asthma.  5. Migraine headaches.  6. Osteoarthritis.   MEDICATIONS:  1. Benicar.  2. Singulair.  3. Advair.  4. Robitussin.  5. Ibuprofen.   ALLERGIES:  No known drug allergies.   SOCIAL HISTORY:  Single, unemployed, smokes less than half a pack a day,  smoked for 20-25  years, and drinks alcohol occasionally.   PHYSICAL EXAMINATION:  GENERAL:  Well-nourished and well-developed  female in no acute distress.  VITAL SIGNS:  Temperature 98.6, pulse 84, weight 233 pounds, and height  5 feet 1 inch.  LUNGS:  Clear to auscultation bilaterally.  HEART: Regular rate and rhythm.  ABDOMEN:  Soft and nontender.  No organomegaly or masses appreciated.  PELVIC:  Normal.  Speculum exam, the vaginal mucosa is clean.  Cervix is  without lesions or discharge.  Bimanual examination revealed the uterus  to be mildly enlarged.  Uterus is anteverted.  The adnexa are without  masses or tenderness or guarding.   IMPRESSION:  Symptomatic uterine fibroids.   PLAN:  Attempt laparoscopic hysterectomy, possible total abdominal  hysterectomy.   ADMITTING LABORATORY VALUES:  Hemoglobin 11, hematocrit 33, white blood  cell count 6400, and platelets 352,000.  Basic metabolic panel was  within normal limits.   HOSPITAL COURSE:  The patient underwent an attempted laparoscopic  hysterectomy on Feb 03, 2008.  The laparoscopic approach was  unsuccessful, and the surgery procedure was converted to total abdominal  hysterectomy, and this was accomplished without complications.  Postoperative course was uncomplicated.  The patient was discharged home  on postop day #4 in good condition.   DISCHARGE LABS:  Hemoglobin 9.6, hematocrit 28, white blood cell count  13,900, and platelets of 304,000.   DISCHARGE DISPOSITION:  Medications, ibuprofen and Tylox were prescribed  for pain.  Continue the meds that were taken prior to coming to the  hospital.  Routine written instructions were given for discharge after  hysterectomy.  The patient is to call our office for a followup  appointment on Feb 10, 2008, for removal of staples.      Charles A. Clearance Coots, M.D.  Electronically Signed     CAH/MEDQ  D:  02/07/2008  T:  02/08/2008  Job:  [id number]

## 2011-02-04 NOTE — Op Note (Signed)
[name], [name]             ACCOUNT NO.:  [id number]   MEDICAL RECORD NO.:  [id number]          PATIENT TYPE:  INP   LOCATION:  9306                          FACILITY:  [facility]   PHYSICIAN:  Roseanna Rainbow, M.D.DATE OF BIRTH:  April 17, 1971   DATE OF PROCEDURE:  02/03/2008  DATE OF DISCHARGE:                               OPERATIVE REPORT   PREOPERATIVE DIAGNOSIS:  Symptomatic uterine fibroids.   POSTOPERATIVE DIAGNOSIS:  Symptomatic uterine fibroids.   PROCEDURES:  Attempted total laparoscopic hysterectomy and completed  abdominal hysterectomy.   SURGEON:  Roseanna Rainbow, MD   ANESTHESIA:  General endotracheal.   COMPLICATIONS:  None.   ESTIMATED BLOOD LOSS:  250 mL.   FLUIDS AND URINE OUTPUT:  As per anesthesiology.   FINDINGS:  Exam under anesthesia, the uterus was midposition,  approximately 10-12 weeks' size.  At laparoscopy, there was a fundal  myoma.  The mid-isthmic portions of the tubes were attenuated consistent  with her previous bilateral tubal ligation.  The ovaries were normal.  A  questionable small ventral hernia related to previous laparoscopic  appendectomy at the periumbilical port was noted.   PROCEDURE:  The risks, benefits, indications, and alternatives of the  procedure were reviewed with the patient and informed consent had been  obtained.  The patient was taken to the operating room with an IV  running.  She was placed in the dorsal lithotomy position, given general  anesthesia, and prepped and draped in the usual sterile fashion.  A  weighted speculum and Deaver were placed in the vagina for retraction.  A RUMI uterine manipulator was then placed.  A supraumbilical skin  incision was then made with a scalpel through the previous laparoscopy  scar.  This was incised sharply down to what was felt to be the hernia  sac that was filled with clear fluid.  There were no bowel or omentum in  its contents.  The parietal peritoneum was  entered under direct  visualization.  This incision was free of adhesions.  Hasson trocar and  sleeve were then advanced into the abdomen.  The abdomen was then  insufflated.  Bilateral 5-mm ports and a 10-mm suprapubic port were then  placed under direct visualization.  A window was then made in the broad  ligament lateral to the infundibulopelvic ligaments on the right side.  This window was extended bluntly.  The utero-ovarian ligaments and  fallopian tubes were then detached from the uterus using bipolar  current.  The Gyrus was used.  The round ligament was then transected  using monopolar shears.  The anterior leaf of the broad ligament was  then incised to the midline.  The bladder flap was then developed  anteriorly using sharp and blunt dissection.  The uterine artery on the  right was then skeletonized.  At this point, there was very limited  mobility using the uterine manipulator.  The uterus could not be  elevated for adequate visualization of the KOH ring.  Also, at this  point, the anesthesiologist reported difficulty with the ventilatory  settings with elevated peak flow pressures and decreased tidal  volumes.  The decision at this point was to abort further attempts at a  laparoscopic approach and to proceed with a laparotomy.  The RUMI was  removed from the uterus.  A midline incision was then carried through  the previous scar sharply down to the fascia.  The fascia was incised  along the length of the incision.  The rectus muscles were separated.  The parietal peritoneum was tented up and entered sharply.  This  incision was then extended superiorly and inferiorly.  A Balfour  retractor was then placed into the incision and the bowel packed away  with moistened laparotomy sponges.  The long Kelly clamps were then  placed on the uterine cornu.  The round ligament on the left side was  then divided with cautery.  The anterior leaf of the broad ligament was  then incised  anteriorly to the bladder flap.  The utero-ovarian ligament  and fallopian tubes were then doubly clamped with parametrial clamps,  transected, and both suture ligatures and free ligatures were placed.  The uterine vessels were then skeletonized on the left.  The uterine  vessels were then secured bilaterally with parametrial clamps.  These  pedicles were divided and suture ligated.  At this point, the fundus was  amputated using cautery above the level of the uterine pedicles.  The  uterosacral ligaments were clamped on both sides, transected, and suture  ligated in a similar fashion.  The cervix was then amputated.  Please  note that, it was felt that the cervix was amputated at the level of the  portio.  The residual cervix/vagina was closed with figure-of-eight  stitches of 0 Vicryl.  Bleeding point was secured with figure-of-eight  suture on the vagina anteriorly.  The pelvis was irrigated copiously.  All laparotomy sponges and instruments were removed from the abdomen.  The fascia for the periumbilical port was closed using interrupted  sutures of 0 Vicryl.  The fascial incision for the laparotomy incision  was reapproximated in a running fashion using #1 PDS.  The skin  incisions with the supraumbilical port and the laparotomy incision were  then closed with staples.  The 5-mm ports were approximated with  Dermabond.  The sponge, lap, needle, and instrument counts were correct  x2.  The patient was taken to the PACU awake and in stable condition.      Roseanna Rainbow, M.D.  Electronically Signed     LAJ/MEDQ  D:  02/03/2008  T:  02/04/2008  Job:  [id number]

## 2011-02-04 NOTE — H&P (Signed)
NAME:  [name], [name]             ACCOUNT NO.:  [id number]   MEDICAL RECORD NO.:  [id number]           PATIENT TYPE:   LOCATION:                                 FACILITY:   PHYSICIAN:  Roseanna Rainbow, M.D.DATE OF BIRTH:  Jul 06, 1971   DATE OF ADMISSION:  DATE OF DISCHARGE:                              HISTORY & PHYSICAL   CHIEF COMPLAINTS:  The patient is a 40 year old with symptomatic uterine  fibroids who presents for an attempted laparoscopic hysterectomy and  possible total abdominal hysterectomy.   HISTORY OF PRESENT ILLNESS:  Please see the above.  The patient has a  history of dysmenorrhea and menorrhagia.  The symptoms are refractory to  ibuprofen.  She has completed her childbearing and is status post  bilateral tubal ligation.  Workup to date has included a hemoglobin of  10.5 on January 04, 2008.  PT, PTT, prolactin, and TSH were all within  normal limits in April 2009.  A Pap smear in April 2009 was negative.  An ultrasound in April 2009 demonstrated uterus with a sagittal diameter  of 10.7 cm.  There was a dominant fibroid anteriorly that measures 4 cm.   PAST MEDICAL HISTORY:  There is a history of chronic hypertension, iron-  deficiency anemia, hypercholesterolemia, asthma, migraine headaches, and  osteoarthritis.   PAST SURGICAL HISTORY:  Possible laparoscopic procedure to drain an  ovarian cyst, there is history of an appendectomy and a bilateral tubal  ligation as well as the 3 previous cesarean deliveries.   SOCIAL HISTORY:  She is single, unemployed, smokes less than one-half  pack per day, has smoked for 20-25 years, drinks alcohol occasionally.   PAST GYN HISTORY:  Please see the above.   PAST OBSTETRICAL HISTORY:  Please see the above.   REVIEW OF SYSTEMS:  GI:  Abdominal pain.  GU:  Please see the above.   ALLERGIES:  No known drug allergies.   MEDICATIONS:  Benicar, Singulair, Advair, and Robitussin.   PHYSICAL EXAM:  VITAL SIGNS:   Temperature 98.6, pulse 84, weight 233  pounds, and height 5 feet 1 inch.  GENERAL:  Well-nourished, well-developed, no apparent distress.  ABDOMEN:  Soft, nontender.  No organomegaly.  PELVIC:  Normal.  SPECULUM:  The vagina is clean.  The cervix is without lesions.  BIMANUAL:  Uterus is mildly enlarged.  The position is anteverted.  The  adnexa are without masses, organomegaly, or guarding.   ASSESSMENT:  Small symptomatic fibroid uterus.   PLAN:  The planned procedure is an attempted laparoscopic hysterectomy,  possible total abdominal hysterectomy.  The risks, benefits, and  alternative forms of management were reviewed with the patient and  informed consent had been obtained.      Roseanna Rainbow, M.D.  Electronically Signed     LAJ/MEDQ  D:  02/02/2008  T:  02/03/2008  Job:  [id number]

## 2011-03-11 IMAGING — CR DG CHEST 2V
2 series · 2 of 2 positions shown · non-contrast
Comparison: 03/05/2010.

CLINICAL DATA: Asthma attack.  Cough and wheezing.  Shortness of
breath.

CHEST - 2 VIEW

[w chest pa]
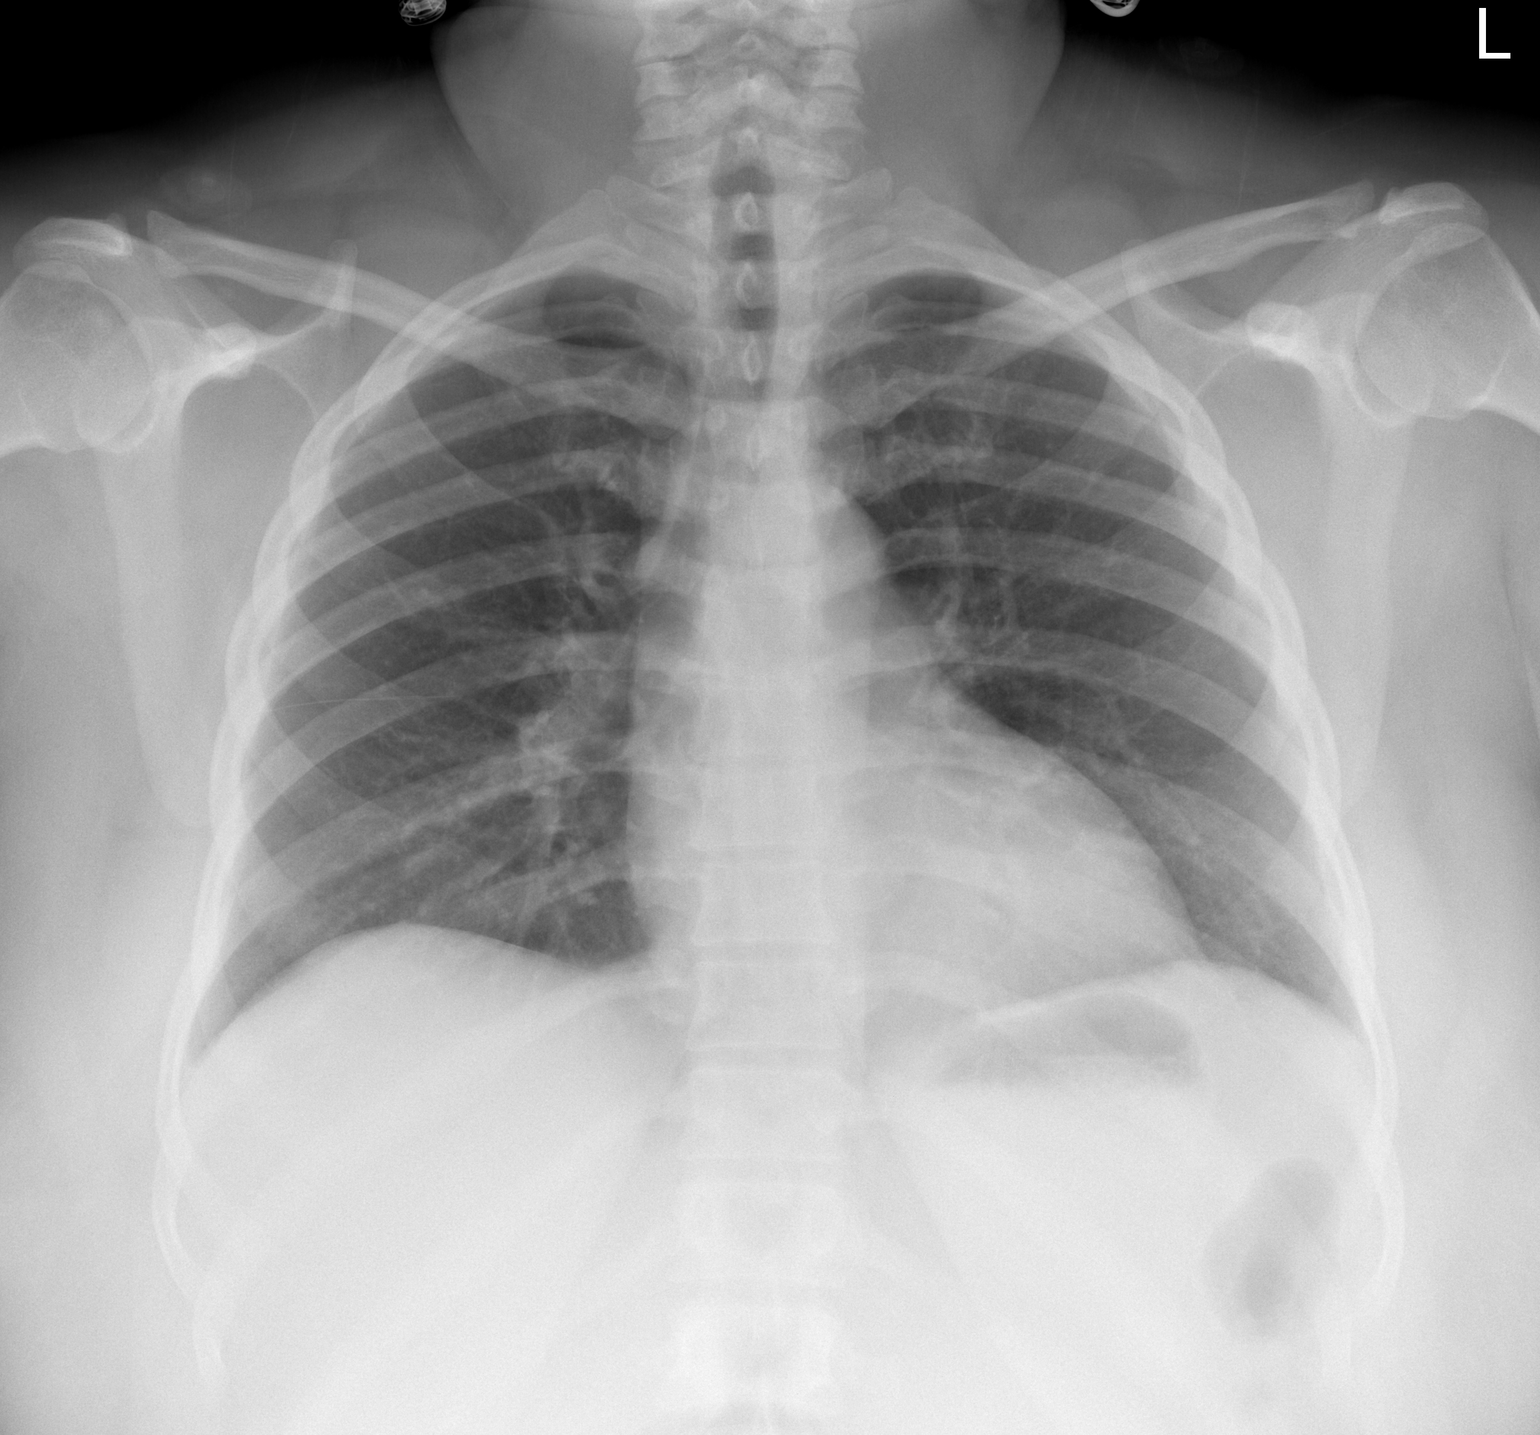

[w chest lat]
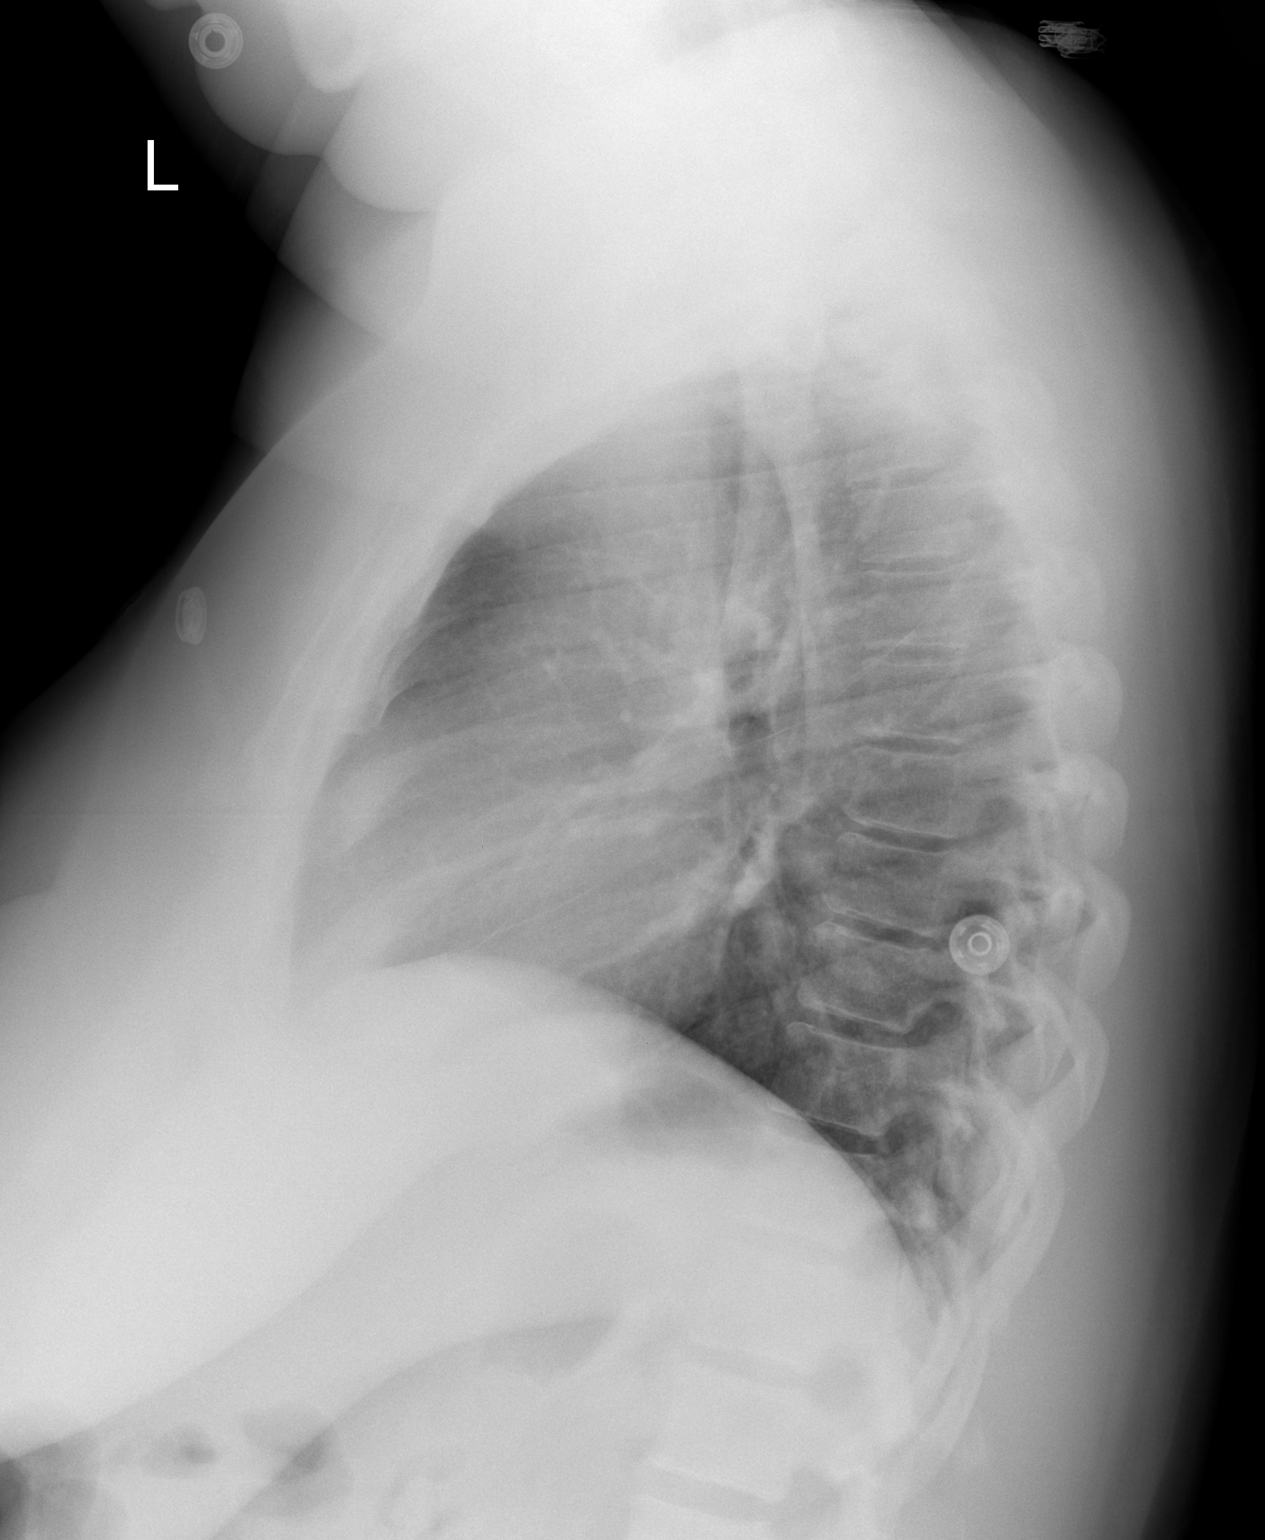

[2 of 2 positions shown; findings below may reference images not displayed]

FINDINGS: The heart size is normal.  The lungs are clear.  The
visualized soft tissues and bony thorax are unremarkable.
IMPRESSION: Negative chest.

## 2011-04-11 NOTE — Op Note (Signed)
  [name], [name]             ACCOUNT NO.:  [id number]  MEDICAL RECORD NO.:  [id number]           PATIENT TYPE:  LOCATION:                                 FACILITY:  PHYSICIAN:  Cindee Salt, M.D.       DATE OF BIRTH:  Jan 23, 1971  DATE OF PROCEDURE:  01/28/2011 DATE OF DISCHARGE:                              OPERATIVE REPORT   PREOPERATIVE DIAGNOSIS:  Carpal tunnel syndrome left hand.  POSTOPERATIVE DIAGNOSIS:  Carpal tunnel syndrome left hand.  OPERATION:  Decompression left median nerve.  SURGEON:  Cindee Salt, MD  ANESTHESIA:  General.  ANESTHESIOLOGIST:  Kaylyn Layer. Michelle Piper, MD  HISTORY:  The patient is a 40 year old female with history of bilateral carpal tunnel syndrome, EMG nerve conduction is positive.  This has not responded to conservative treatment.  She has elected to undergo surgical decompression.  She has undergone decompression to the right and is admitted now for decompression to the left side.  She is aware of risks and complications including infection, recurrence injury to arteries, nerves, tendons, incomplete relief of symptoms and dystrophy. In the preoperative area, the patient is seen.  The extremity marked by both the patient and surgeon.  Antibiotic given.  PROCEDURE:  The patient is brought to the operating room where a general anesthetic was carried out without difficulty.  She was prepped using ChloraPrep, supine position, left arm free.  A 3-minute dry time was allowed.  Time-out taken confirming the patient and procedure.  The limb was exsanguinated with an Esmarch bandage, tourniquet placed on the forearm, was inflated to 250 mmHg.  A straight incision was made longitudinally in the palm and carried down through subcutaneous tissue. Bleeders were electrocauterized.  Palmar fascia was split, superficial palmar arch identified, the flexor tendon to the ring and little finger identified.  To the ulnar side of the median nerve, the  carpal retinaculum was incised with sharp dissection.  Right angle and Sewall retractor were placed between skin and forearm fascia.  The fascia released for approximately a centimeter and half proximal to the wrist crease under direct vision.  Canal was explored.  Area of compression to the nerve was apparent.  No further lesions were identified.  The wound was irrigated.  Skin closed with interrupted 5-0 Vicryl Rapide sutures. Local infiltration with 0.25% Marcaine without epinephrine was given, 7 mL was used.  Sterile compressive dressing and splint to the wrist with fingers free was applied.  Deflation of the tourniquet, all fingers immediately pinked.  She was taken to the recovery room for observation in satisfactory condition.  She will be discharged home to return to the [facility] in 1 week on Vicodin.          ______________________________ Cindee Salt, M.D.     GK/MEDQ  D:  01/28/2011  T:  01/28/2011  Job:  [id number]  Electronically Signed by Cindee Salt M.D. on 04/11/2011 09:12:13 AM

## 2011-05-01 ENCOUNTER — Emergency Department (HOSPITAL_COMMUNITY): Payer: Medicaid Other

## 2011-05-01 ENCOUNTER — Observation Stay (HOSPITAL_COMMUNITY)
Admission: EM | Admit: 2011-05-01 | Discharge: 2011-05-01 | Disposition: A | Discharge status: Home / Self Care | Payer: Medicaid Other | Attending: Emergency Medicine | Admitting: Emergency Medicine

## 2011-05-01 DIAGNOSIS — E876 Hypokalemia: Secondary | ICD-10-CM | POA: Insufficient documentation

## 2011-05-01 DIAGNOSIS — J45909 Unspecified asthma, uncomplicated: Principal | ICD-10-CM | POA: Insufficient documentation

## 2011-05-01 LAB — CBC
HCT: 38 % (ref 36.0–46.0)
Hemoglobin: 13.1 g/dL (ref 12.0–15.0)
MCH: 29.5 pg (ref 26.0–34.0)
MCHC: 34.5 g/dL (ref 30.0–36.0)
MCV: 85.6 fL (ref 78.0–100.0)
RDW: 14.4 % (ref 11.5–15.5)

## 2011-05-01 LAB — DIFFERENTIAL
Eosinophils Relative: 1 % (ref 0–5)
Lymphocytes Relative: 42 % (ref 12–46)
Lymphs Abs: 4.8 10*3/uL — ABNORMAL HIGH (ref 0.7–4.0)
Monocytes Absolute: 0.8 10*3/uL (ref 0.1–1.0)
Monocytes Relative: 7 % (ref 3–12)
Neutro Abs: 5.6 10*3/uL (ref 1.7–7.7)

## 2011-05-01 LAB — URINALYSIS, ROUTINE W REFLEX MICROSCOPIC
Glucose, UA: NEGATIVE mg/dL
Ketones, ur: NEGATIVE mg/dL
Leukocytes, UA: NEGATIVE
Nitrite: NEGATIVE
Protein, ur: NEGATIVE mg/dL
pH: 8.5 — ABNORMAL HIGH (ref 5.0–8.0)

## 2011-05-01 LAB — COMPREHENSIVE METABOLIC PANEL
Albumin: 3.5 g/dL (ref 3.5–5.2)
Alkaline Phosphatase: 100 U/L (ref 39–117)
BUN: 9 mg/dL (ref 6–23)
Creatinine, Ser: 0.93 mg/dL (ref 0.50–1.10)
GFR calc Af Amer: >60 mL/min (ref >60)
Glucose, Bld: 142 mg/dL — ABNORMAL HIGH (ref 70–99)
Total Protein: 7.7 g/dL (ref 6.0–8.3)

## 2011-05-01 LAB — GLUCOSE, CAPILLARY

## 2011-05-02 LAB — URINE CULTURE
Colony Count: >=100000
Culture  Setup Time: 201208091743

## 2011-05-04 ENCOUNTER — Emergency Department (HOSPITAL_COMMUNITY): Payer: Medicaid Other

## 2011-05-04 ENCOUNTER — Inpatient Hospital Stay (HOSPITAL_COMMUNITY)
Admission: EM | Admit: 2011-05-04 | Discharge: 2011-05-09 | DRG: 153 | Disposition: A | Discharge status: Home / Self Care | Payer: Medicaid Other | Attending: Internal Medicine | Admitting: Internal Medicine

## 2011-05-04 DIAGNOSIS — I10 Essential (primary) hypertension: Secondary | ICD-10-CM | POA: Diagnosis present

## 2011-05-04 DIAGNOSIS — R079 Chest pain, unspecified: Secondary | ICD-10-CM | POA: Diagnosis present

## 2011-05-04 DIAGNOSIS — E876 Hypokalemia: Secondary | ICD-10-CM | POA: Diagnosis not present

## 2011-05-04 DIAGNOSIS — E669 Obesity, unspecified: Secondary | ICD-10-CM | POA: Diagnosis present

## 2011-05-04 DIAGNOSIS — J45901 Unspecified asthma with (acute) exacerbation: Secondary | ICD-10-CM | POA: Diagnosis present

## 2011-05-04 DIAGNOSIS — J042 Acute laryngotracheitis: Principal | ICD-10-CM | POA: Diagnosis present

## 2011-05-04 DIAGNOSIS — E119 Type 2 diabetes mellitus without complications: Secondary | ICD-10-CM | POA: Diagnosis present

## 2011-05-04 DIAGNOSIS — G43909 Migraine, unspecified, not intractable, without status migrainosus: Secondary | ICD-10-CM | POA: Diagnosis present

## 2011-05-04 DIAGNOSIS — M1990 Unspecified osteoarthritis, unspecified site: Secondary | ICD-10-CM | POA: Diagnosis present

## 2011-05-04 DIAGNOSIS — F17200 Nicotine dependence, unspecified, uncomplicated: Secondary | ICD-10-CM | POA: Diagnosis present

## 2011-05-04 LAB — GLUCOSE, CAPILLARY: Glucose-Capillary: 153 mg/dL — ABNORMAL HIGH (ref 70–99)

## 2011-05-04 LAB — TROPONIN I: Troponin I: <0.3 ng/mL (ref <0.30)

## 2011-05-04 LAB — POCT I-STAT, CHEM 8
BUN: 13 mg/dL (ref 6–23)
Calcium, Ion: 1.07 mmol/L — ABNORMAL LOW (ref 1.12–1.32)
Chloride: 107 meq/L (ref 96–112)
Creatinine, Ser: 1 mg/dL (ref 0.50–1.10)
Glucose, Bld: 159 mg/dL — ABNORMAL HIGH (ref 70–99)
HCT: 39 % (ref 36.0–46.0)
Hemoglobin: 13.3 g/dL (ref 12.0–15.0)
Potassium: 3.2 meq/L — ABNORMAL LOW (ref 3.5–5.1)
Sodium: 138 mEq/L (ref 135–145)
TCO2: 19 mmol/L (ref 0–100)

## 2011-05-04 LAB — POCT I-STAT 3, ART BLOOD GAS (G3+)
Acid-base deficit: 1 mmol/L (ref 0.0–2.0)
Bicarbonate: 21.5 mEq/L (ref 20.0–24.0)
O2 Saturation: 98 %
Patient temperature: 37
TCO2: 22 mmol/L (ref 0–100)
pCO2 arterial: 30.3 mmHg — ABNORMAL LOW (ref 35.0–45.0)
pH, Arterial: 7.458 — ABNORMAL HIGH (ref 7.350–7.400)
pO2, Arterial: 96 mmHg (ref 80.0–100.0)

## 2011-05-04 LAB — DIFFERENTIAL
Basophils Relative: 0 % (ref 0–1)
Eosinophils Absolute: 0 10*3/uL (ref 0.0–0.7)
Lymphocytes Relative: 24 % (ref 12–46)
Lymphs Abs: 3.8 10*3/uL (ref 0.7–4.0)
Monocytes Relative: 3 % (ref 3–12)
Neutro Abs: 11.4 10*3/uL — ABNORMAL HIGH (ref 1.7–7.7)

## 2011-05-04 LAB — CK TOTAL AND CKMB (NOT AT ARMC): Relative Index: 0.8 (ref 0.0–2.5)

## 2011-05-04 LAB — CBC
HCT: 37.1 % (ref 36.0–46.0)
MCH: 28.8 pg (ref 26.0–34.0)
MCV: 85.5 fL (ref 78.0–100.0)
RBC: 4.34 MIL/uL (ref 3.87–5.11)
WBC: 15.7 10*3/uL — ABNORMAL HIGH (ref 4.0–10.5)

## 2011-05-04 NOTE — H&P (Signed)
[name], [name] NO.:  [id number]  MEDICAL RECORD NO.:  [id number]  LOCATION:  [facility]                         FACILITY:  [facility]  PHYSICIAN:  Celso Amy, MD   DATE OF BIRTH:  1970/10/24  DATE OF ADMISSION:  05/04/2011 DATE OF DISCHARGE:                             HISTORY & PHYSICAL   PRIMARY CARE PHYSICIAN:  Mina Marble, MD  CHIEF COMPLAINT:  Shortness of breath.  HISTORY OF PRESENT ILLNESS:  The patient is a 40 year old African American female with a past medical history of asthma, who presented to the ER with chief complaint of shortness of breath.  History of present illness dates back to last Tuesday when the patient had sudden onset of shortness of breath.  It got progressively worse and the patient presented to the ER on Thursday.  The patient was discharged home on p.o. KCl and p.o. steroids, but the patient did not get better.  The patient again presented to the ER this evening with chief complaint of "I am having another asthma exacerbation."  The patient complained of chest pain, which increases on coughing and midsternal in location.  The patient complained of back pain.  The patient complained of headache. The patient complained of cough productive in nature with minimal yellowish phlegm.  No complaint of syncope.  The patient complained of nausea, vomiting, and diarrhea.  The patient said it started last night. There was no blood in emesis or in stool.  No history of any focal motor deficits.  No history of recent travel.  No history of sick contacts.  REVIEW OF SYSTEMS:  Negative besides the HPI.  PAST MEDICAL HISTORY:  Positive for: 1. Hypertension. 2. Anemia. 3. Asthma. 4. Migraine. 5. Osteoarthritis. 6. The patient is status post C-section. 7. Status post laminectomy. 8. Status post total abdominal hysterectomy. 9. Status post decompression of right median nerve. 10.History of carpal tunnel syndrome.  MEDICATIONS:   As outpatient, the patient is on: 1. Hydroxyzine 25 mg p.o. daily as needed. 2. Simvastatin 10 mg p.o. daily as needed. 3. Hydrocodone/Tylenol 5/500 q.4 hours as needed. 4. Zyrtec 10 mg p.o. daily as needed. 5. Metformin 500 mg p.o. daily. 6. Sertraline 25 mg p.o. daily. 7. Cyclobenzaprine 10 mg p.o. q.8 h as needed. 8. Gabapentin 1-2 capsules b.i.d. 9. Advair 1 puff daily as needed. 10.Albuterol inhalers. 11.Benazepril and hydrochlorothiazide 20/12.5 p.o. daily. 12.Topiramate 100 mg p.o. daily at bedtime.  PHYSICAL EXAMINATION:  VITAL SIGNS:  Blood pressure 147/98, pulse 106, pulse ox 100% on 2 liters, respirations 18, temperature afebrile. GENERAL:  The patient is awake; alert; oriented to time, place, and person; is resting comfortably on the bed in mild respiratory distress. HEENT:  Pupils equally reactive to light and accommodation.  Extraocular movements intact.  Head is atraumatic, normocephalic. RESPIRATORY:  The patient is wheezing bilaterally, using accessory muscles, is unable to complete full sentences. Cardiovascular:  S1 and S2 regular in rate and rhythm.  No murmur appreciated. GI:  Deep bowel sounds present. ABDOMEN:  Soft, nontender, and nondistended, is obese. EXTREMITIES:  No lower extremity edema or cyanosis was seen. CNS:  Cranial nerves II through XII were grossly intact.  No focal motor  deficit was seen.  The patient has strength 5/5 both upper and lower extremities. PSYCHIATRIC:  Normal mood and affect.  ALLERGIES:  The patient has no known drug allergies.  SOCIAL HISTORY:  The patient is still smoking.  Her last cigarette was before this exacerbation last Wednesday.  The patient denies drug abuse.  FAMILY HISTORY:  The patient's mother died from stroke and MI at the age of 89.  The patient's father died from stroke at the age of 75.  LABORATORY DATA:  ABG was now with pH of 7.45, pCO2 of 30, pO2 of 96, bicarb 21.5.  This is on 2 liters of oxygen after  the ER treatment.  The patient's peak flow was 175 units per minute.  The patient's labs are from May 01, 2011.  This morning labs were not yet done.  They are still pending.  Sodium 138, potassium 3, bicarb 24, serum chloride 102, BUN 9, serum creatinine 0.9, glucose 142.  The patient's WBC 11.3, the patient's hemoglobin 13.1, platelets 314.  The patient's AST, ALT, ALP were normal.  Albumin was 3.5.  The patient's chest x-ray done today shows low volume.  UA done on May 01, 2011 shows pH of 8.4, was negative for nitrate or leukocytes.  IMPRESSION: 1. Respiratory:  The patient has a history of asthma, is still     smoking.  The patient does not check her peak flow at home.  The     patient did not respond to inhaler and p.o. steroids.  The     patient's peak flow in the ER was 175 liters per minute.  The     patient's expected peak flow for her age and height is 430 liters     per minute. 2. Cardiovascular:  The patient is complaining of chest pain.  This is     most likely because of asthma exacerbation, but the patient with a     history of hypertension, diabetes, and family history of     cerebrovascular accident, myocardial infarction at the young age. 3. Fluid, electrolytes, and nutrition:  The patient was hypokalemic     during ER visit on May 01, 2011.  No labs available at this time     of H and P. 4. Diabetes mellitus 2:  The patient is on metformin. 5. Deep venous thrombosis:  The patient will need deep venous     thrombosis prophylaxis. 6. Pain:  The patient with history of pain control in the past.  The     patient had ER visit in the past for migraine.  PLAN: 1. We will admit the patient to tele. 2. We will cycle trop. 3. We will get basic labs. 4. We will start nebulizers. 5. We will start IV steroids. 6. We will give magnesium sulfate. 7. We will start the patient on sliding scale of insulin as the     patient is going to be on IV steroid. 8. We will keep  the patient on DVT prophylaxis. 9. The patient's further treatment depends upon how the patient does     with this plan and with her lab results.     Celso Amy, MD     MB/MEDQ  D:  05/04/2011  T:  05/04/2011  Job:  [id number]  Electronically Signed by Celso Amy M.D. on 05/04/2011 09:27:15 PM

## 2011-05-05 LAB — CARDIAC PANEL(CRET KIN+CKTOT+MB+TROPI)
CK, MB: 1.9 ng/mL (ref 0.3–4.0)
CK, MB: 2.2 ng/mL (ref 0.3–4.0)
Relative Index: 0.6 (ref 0.0–2.5)
Relative Index: 0.6 (ref 0.0–2.5)
Total CK: 308 U/L — ABNORMAL HIGH (ref 7–177)
Total CK: 347 U/L — ABNORMAL HIGH (ref 7–177)
Troponin I: <0.3 ng/mL (ref <0.30)
Troponin I: <0.3 ng/mL (ref <0.30)

## 2011-05-05 LAB — BASIC METABOLIC PANEL
BUN: 13 mg/dL (ref 6–23)
CO2: 19 mEq/L (ref 19–32)
Calcium: 9.3 mg/dL (ref 8.4–10.5)
Chloride: 100 mEq/L (ref 96–112)
Creatinine, Ser: 1.02 mg/dL (ref 0.50–1.10)
GFR calc Af Amer: >60 mL/min (ref >60)
GFR calc non Af Amer: 60 mL/min — ABNORMAL LOW (ref >60)
Glucose, Bld: 219 mg/dL — ABNORMAL HIGH (ref 70–99)
Potassium: 4.2 mEq/L (ref 3.5–5.1)
Sodium: 134 mEq/L — ABNORMAL LOW (ref 135–145)

## 2011-05-05 LAB — CBC
HCT: 35.7 % — ABNORMAL LOW (ref 36.0–46.0)
Hemoglobin: 12.2 g/dL (ref 12.0–15.0)
MCH: 29.4 pg (ref 26.0–34.0)
MCHC: 34.2 g/dL (ref 30.0–36.0)
MCV: 86 fL (ref 78.0–100.0)
Platelets: 327 10*3/uL (ref 150–400)
RBC: 4.15 MIL/uL (ref 3.87–5.11)
RDW: 14.7 % (ref 11.5–15.5)
WBC: 13.3 10*3/uL — ABNORMAL HIGH (ref 4.0–10.5)

## 2011-05-05 LAB — LIPID PANEL
Cholesterol: 176 mg/dL (ref 0–200)
HDL: 71 mg/dL (ref >39)
LDL Cholesterol: 94 mg/dL (ref 0–99)
Total CHOL/HDL Ratio: 2.5 RATIO
Triglycerides: 56 mg/dL (ref <150)
VLDL: 11 mg/dL (ref 0–40)

## 2011-05-05 LAB — HEMOGLOBIN A1C
Hgb A1c MFr Bld: 7.7 % — ABNORMAL HIGH (ref <5.7)
Mean Plasma Glucose: 174 mg/dL — ABNORMAL HIGH (ref <117)

## 2011-05-05 LAB — GLUCOSE, CAPILLARY: Glucose-Capillary: 167 mg/dL — ABNORMAL HIGH (ref 70–99)

## 2011-05-05 LAB — TSH: TSH: 0.735 u[IU]/mL (ref 0.350–4.500)

## 2011-05-06 LAB — GLUCOSE, CAPILLARY: Glucose-Capillary: 112 mg/dL — ABNORMAL HIGH (ref 70–99)

## 2011-05-07 DIAGNOSIS — R0902 Hypoxemia: Secondary | ICD-10-CM

## 2011-05-07 DIAGNOSIS — J383 Other diseases of vocal cords: Secondary | ICD-10-CM

## 2011-05-07 DIAGNOSIS — J45909 Unspecified asthma, uncomplicated: Secondary | ICD-10-CM

## 2011-05-07 LAB — GLUCOSE, CAPILLARY: Glucose-Capillary: 205 mg/dL — ABNORMAL HIGH (ref 70–99)

## 2011-05-08 LAB — CARDIAC PANEL(CRET KIN+CKTOT+MB+TROPI)
CK, MB: 1.5 ng/mL (ref 0.3–4.0)
CK, MB: 1.5 ng/mL (ref 0.3–4.0)
CK, MB: 1.5 ng/mL (ref 0.3–4.0)
Relative Index: INVALID (ref 0.0–2.5)
Total CK: 79 U/L (ref 7–177)
Troponin I: <0.3 ng/mL (ref <0.30)

## 2011-05-08 LAB — GLUCOSE, CAPILLARY

## 2011-05-09 LAB — CBC
HCT: 37.6 % (ref 36.0–46.0)
MCHC: 34 g/dL (ref 30.0–36.0)
MCV: 86 fL (ref 78.0–100.0)
RDW: 14.8 % (ref 11.5–15.5)

## 2011-05-09 LAB — BASIC METABOLIC PANEL
BUN: 27 mg/dL — ABNORMAL HIGH (ref 6–23)
Creatinine, Ser: 1.22 mg/dL — ABNORMAL HIGH (ref 0.50–1.10)
GFR calc Af Amer: 59 mL/min — ABNORMAL LOW (ref >60)
GFR calc non Af Amer: 49 mL/min — ABNORMAL LOW (ref >60)
Potassium: 3.4 mEq/L — ABNORMAL LOW (ref 3.5–5.1)

## 2011-05-09 LAB — GLUCOSE, CAPILLARY: Glucose-Capillary: 118 mg/dL — ABNORMAL HIGH (ref 70–99)

## 2011-05-16 ENCOUNTER — Encounter: Payer: Self-pay | Admitting: Emergency Medicine

## 2011-05-16 ENCOUNTER — Encounter: Payer: Self-pay | Admitting: *Deleted

## 2011-05-16 NOTE — Consult Note (Addendum)
[name], Bradley NO.:  [id number]  MEDICAL RECORD NO.:  [id number]  LOCATION:  3715                         FACILITY:  [facility]  PHYSICIAN:  Felipa Evener, MD  DATE OF BIRTH:  10-21-1970  DATE OF CONSULTATION:  05/07/2011 DATE OF DISCHARGE:                                CONSULTATION   CONSULTING PHYSICIAN:  Andreas Blower, MD of [facility].  PRIMARY CARE PHYSICIAN:  Mina Marble, MD  REASON FOR CONSULT:  Asthma exacerbation.  ALLERGIES:  She is allergic to ADHESIVE TAPE.  HISTORY OF PRESENT ILLNESS:  [name] is a 40 year old African American female, active smoker with a past medical history of asthma, who presented to [facility] on May 04, 2011, with complaints of a 1- week history of progressive shortness of breath.  She was seen in the emergency department and sent home with a steroid taper; however, she felt her shortness of breath with getting worse and returned on the evening of May 04, 2011.  She complained of pleuritic chest pain with negative troponins and cough with yellow sputum production.  She was admitted to [facility] per [facility], placed on nebulized bronchodilators, prednisone taper, and Keflex.  She does have a known history of hypertension and was continued on benazepril initially during hospital course.  Pulmonary and Critical Care Medicine were consulted on May 07, 2011, for assistance in asthma management. At the time of this consultation, [name] indicates that she has been on home Advair, however, does not use daily.  She only used this as needed.  She also has p.r.n. albuterol inhaler and nebulized albuterol at home.  She indicates that she does not use her albuterol more than once weekly.  Currently, she denies recent travel, leg or calf pain, back pain, syncope, or hemoptysis.  She indicates that she is still short of breath and that she has been evaluated recently by  [facility] and is scheduled to see ENT on the 20th of this month for further evaluation of her vocal cords.  HOME MEDICATIONS: 1. Hydroxyzine 25 mg p.o. daily. 2. Simvastatin 10 mg p.o. daily. 3. Hydrocodone/APAP 5/500 one tablet every 4 hours as needed. 4. Zyrtec 10 mg p.o. daily. 5. Metformin 500 mg p.o. daily. 6. Sertraline 25 mg p.o. daily. 7. Cyclobenzaprine 10 mg every 8 hours as needed. 8. Neurontin 300 mg one capsule in the morning and two capsules at     night. 9. Advair Diskus 500/50.  Again, the patient has been using this as     needed. 10.Albuterol MDI inhaled two puffs twice daily as needed. 11.Benazepril/hydrochlorothiazide 20/12.5 mg one tablet daily. 12.Topiramate 100 mg p.o. two tablets daily at bedtime.  PAST MEDICAL HISTORY/SURGICAL HISTORY: 1. Hypertension. 2. Asthma. 3. Anemia. 4. Migraines. 5. Back pain. 6. Hysterectomy. 7. Carpal tunnel bilateral hands with release.  SOCIAL HISTORY:  [name] lives in [place] with her fiance. 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  She has been smoking since the age of 53 and at her heaviest smoke one pack per day, however, is down to a pack per 3 days at this time.  She denies alcohol and drug use.  FAMILY HISTORY:  Mother has history of CVA and MI and her father has a history of CVA.  REVIEW OF SYSTEMS:  CODE STATUS:  Full code.  GENERAL:  Denies fevers or chills.  PULMONARY:  Indicates shortness of breath, cough, and wheezing. All other systems reviewed and are negative.  Please see history of present illness.  PHYSICAL EXAMINATION:  VITAL SIGNS:  Temperature 98.3, blood pressure 125/63, heart rate 84, respirations 20, oxygen saturation 98% on room air. GENERAL:  This is an obese, adult female in no acute distress. NEUROLOGICAL:  She is awake, alert, and oriented.  Cranial nerves II through XII are grossly intact.  Strength is normal x5 extremities. HEENT:  Normocephalic and atraumatic.   Pupils are equal, round, and reactive to light.  Sclerae are clear.  Nares are patent without discharge.  Mucous membranes are moist. NECK:  Supple without lymphadenopathy, no thyromegaly, bruits, or JVD. She does have a thick neck. CARDIOVASCULAR:  S1, S2, regular rate and rhythm, distant.  No murmurs, rubs, or gallops. PULMONARY:  Respirations are even and unlabored.  She is essentially clear bilaterally, however, does have a noted upper airway wheeze and hoarseness. GI:  Abdomen is soft and nontender.  Bowel sounds x4, active. MUSCULOSKELETAL:  She is moving all extremities.  No obvious joint deformities. SKIN:  No rashes or lesions.  RADIOLOGIC DATA:  Chest x-ray demonstrates low lung volumes, but no acute infiltrate.  LABORATORY DATA:  On May 05, 2011, laboratory data includes a cardiac panel with a CK of 308, CK-MB 1.9, troponin less than 0.30.  Lipid profile includes cholesterol 176, triglycerides 56, HDL 71, and LDL 94. BMET demonstrates sodium 134, potassium 4.2, chloride 100, CO2 19, glucose 219, BUN 13, creatinine 1.02, and calcium 9.3.  CBC demonstrates WBC 13.3, hemoglobin 12.2, hematocrit 35.7, platelet count 327. Hemoglobin A1c is 7.7.  TSH is 0.735.  ASSESSMENT AND PLAN:  Acute asthma exacerbation plus or minus vocal cord dysfunction.  [name] is a 40 year old African American female with a known history of asthma and continued smoking.  She indicates at this time that she has been evaluated by ENT and is scheduled to see them again on May 12, 2011, for reevaluation of her vocal cords.  She does indicate that she has a known "narrowed airway."  She endorses that she does not use her Advair daily.  This was discussed at time of evaluation regarding compliance as well as smoking cessation.  She currently has been continued on ACE inhibitor; and in the setting of upper airway irritation, it is recommended that this be discontinued with likely no further  use of ACE inhibitors in the setting of asthma and probable vocal cord dysfunction.  It was recommended we continue with prednisone taper and p.r.n. bronchodilators.  She previously was on Advair and will be transitioned to Symbicort in the setting of probable vocal cord dysfunction.  Again, smoking cessation was stressed as well as lifestyle modifications to include exercise for healthier lifestyle.  It was recommended to avoid powdered inhalers with her upper airway irritation.  She is currently scheduled for follow up with Dr. Levy Pupa in the Pulmonary Office for further evaluation and probable PFTs.  Thank you for this consultation.     Canary Brim, NP   ______________________________ Felipa Evener, MD    BO/MEDQ  D:  05/07/2011  T:  05/07/2011  Job:  [id number]  cc:   Mina Marble, M.D.  Electronically Signed by Canary Brim  on  05/16/2011 03:53:06 PM Electronically Signed by Koren Bound MD on 06/12/2011 02:41:10 PM

## 2011-05-19 ENCOUNTER — Encounter: Payer: Self-pay | Admitting: Emergency Medicine

## 2011-05-19 ENCOUNTER — Ambulatory Visit (INDEPENDENT_AMBULATORY_CARE_PROVIDER_SITE_OTHER): Payer: Medicaid Other | Admitting: Emergency Medicine

## 2011-05-19 VITALS — BP 118/88 | HR 98 | Temp 98.2°F | Ht 62.0 in | Wt 232.6 lb

## 2011-05-19 DIAGNOSIS — J45909 Unspecified asthma, uncomplicated: Secondary | ICD-10-CM

## 2011-05-19 NOTE — Assessment & Plan Note (Signed)
-   continue Symbicort for now - prn albuterol - full PFT - rov 1 mon

## 2011-05-19 NOTE — Patient Instructions (Signed)
Please continue your Symbicort  Use albuterol as needed We will perform pulmonary function testing at your next visit Follow up with Dr Delton Coombes in 1 month

## 2011-05-19 NOTE — Progress Notes (Signed)
Subjective:    Patient ID: [name], female    DOB: Sep 02, 1971, 40 y.o.   MRN: [id number]  HPI 40 yo smoker, hx Asthma dx in her 20's, and UA irritation. Hospitalized 8/12-8/17 with an exacerbation in the setting possible trachiitis, viral URI.  She tells me that she has in the past had many hospitalizations for flares. Has had PFT's in the past but not recently. She has been managed with Advair in the past, many other BD's. Sent out of the hospital on Symbicort bid, prn albuterol. She is on zyrtec, still has breakthrough allergy symptoms. She is scheduled for tonsillectomy and adenoidectomy in Sept 14.    Review of Systems  Constitutional: Negative for fever and unexpected weight change.  HENT: Positive for sore throat, rhinorrhea and trouble swallowing. Negative for ear pain, nosebleeds, congestion, sneezing, dental problem, postnasal drip and sinus pressure.   Eyes: Negative for redness and itching.  Respiratory: Positive for cough, chest tightness and shortness of breath. Negative for wheezing.   Cardiovascular: Positive for palpitations. Negative for leg swelling.  Gastrointestinal: Positive for nausea. Negative for vomiting.  Genitourinary: Negative for dysuria.  Musculoskeletal: Negative for joint swelling.  Skin: Negative for rash.  Neurological: Positive for headaches.  Hematological: Bruises/bleeds easily.  Psychiatric/Behavioral: Positive for dysphoric mood. The patient is not nervous/anxious.     Past Medical History  Diagnosis Date  . DM (diabetes mellitus)   . Hypertension   . Hypokalemia      Family History  Problem Relation Age of Onset  . Heart attack Mother   . Stroke Mother   . Diabetes Mother   . Hypertension Mother   . Arthritis Mother   . Stroke Father   . Hypertension Sister   . Diabetes Sister   . Seizures Brother   . Diabetes Brother      History   Social History  . Marital Status: Single    Spouse Name: N/A    Number of Children:  N/A  . Years of Education: N/A   Occupational History  . Not on file.   Social History Main Topics  . Smoking status: Former Smoker -- 0.3 packs/day for 22 years    Types: Cigarettes    Quit date: 05/01/2011  . Smokeless tobacco: Never Used  . Alcohol Use: Yes     rare  . Drug Use: Not on file  . Sexually Active: Not on file   Other Topics Concern  . Not on file   Social History Narrative  . No narrative on file     No Known Allergies   Outpatient Prescriptions Prior to Visit  Medication Sig Dispense Refill  . albuterol (PROVENTIL HFA;VENTOLIN HFA) 108 (90 BASE) MCG/ACT inhaler Inhale 2 puffs into the lungs every 6 (six) hours as needed.        . benzonatate (TESSALON) 100 MG capsule Take 100 mg by mouth 3 (three) times daily as needed.        . budesonide-formoterol (SYMBICORT) 160-4.5 MCG/ACT inhaler Inhale 2 puffs into the lungs 2 (two) times daily.        . cetirizine (ZYRTEC) 10 MG tablet Take 10 mg by mouth daily.        Marland Kitchen Dextromethorphan-Guaifenesin (ROBITUSSIN DM PO) Take by mouth. 5ml every 6 hours prn       . gabapentin (NEURONTIN) 300 MG capsule Take 300 mg by mouth 2 (two) times daily. 1-2 tablets twice daily       . hydrochlorothiazide (HYDRODIURIL) 12.5  MG tablet Take 12.5 mg by mouth 2 (two) times daily.        Marland Kitchen HYDROcodone-acetaminophen (VICODIN) 5-500 MG per tablet Take 1 tablet by mouth every 4 (four) hours as needed.        . metFORMIN (GLUCOPHAGE) 500 MG tablet Take 500 mg by mouth daily.        . predniSONE (DELTASONE) 20 MG tablet Take 20 mg by mouth. Taper dose       . sertraline (ZOLOFT) 25 MG tablet Take 25 mg by mouth daily.        . simvastatin (ZOCOR) 10 MG tablet Take 10 mg by mouth at bedtime.        . topiramate (TOPAMAX) 100 MG tablet Take 100 mg by mouth at bedtime.        . Moxifloxacin HCl (AVELOX PO) Take 500 mg by mouth daily. Take daily for 7 days            Objective:   Physical Exam  Gen: Pleasant, obese, in no distress,   Slightly anxious  ENT: No lesions,  mouth clear,  Very narrow post pharynx  Neck: No JVD, no TMG, no carotid bruits  Lungs: No use of accessory muscles, no dullness to percussion, clear without rales or rhonchi  Cardiovascular: RRR, heart sounds normal, no murmur or gallops, no peripheral edema  Musculoskeletal: No deformities, no cyanosis or clubbing  Neuro: alert, non focal  Skin: Warm, no lesions or rashes     Assessment & Plan:

## 2011-06-06 ENCOUNTER — Other Ambulatory Visit: Payer: Self-pay

## 2011-06-13 ENCOUNTER — Emergency Department (HOSPITAL_COMMUNITY)
Admission: EM | Admit: 2011-06-13 | Discharge: 2011-06-13 | Disposition: A | Discharge status: Home / Self Care | Payer: Medicaid Other | Attending: Emergency Medicine | Admitting: Emergency Medicine

## 2011-06-13 DIAGNOSIS — J45909 Unspecified asthma, uncomplicated: Secondary | ICD-10-CM | POA: Insufficient documentation

## 2011-06-13 DIAGNOSIS — G8918 Other acute postprocedural pain: Secondary | ICD-10-CM | POA: Insufficient documentation

## 2011-06-13 DIAGNOSIS — J3489 Other specified disorders of nose and nasal sinuses: Secondary | ICD-10-CM | POA: Insufficient documentation

## 2011-06-13 DIAGNOSIS — R112 Nausea with vomiting, unspecified: Secondary | ICD-10-CM | POA: Insufficient documentation

## 2011-06-13 DIAGNOSIS — I10 Essential (primary) hypertension: Secondary | ICD-10-CM | POA: Insufficient documentation

## 2011-06-13 DIAGNOSIS — E119 Type 2 diabetes mellitus without complications: Secondary | ICD-10-CM | POA: Insufficient documentation

## 2011-06-13 DIAGNOSIS — M542 Cervicalgia: Secondary | ICD-10-CM | POA: Insufficient documentation

## 2011-06-13 LAB — DIFFERENTIAL
Basophils Absolute: 0 10*3/uL (ref 0.0–0.1)
Basophils Relative: 0 % (ref 0–1)
Eosinophils Relative: 1 % (ref 0–5)
Monocytes Absolute: 0.6 10*3/uL (ref 0.1–1.0)

## 2011-06-13 LAB — CBC
MCHC: 34.4 g/dL (ref 30.0–36.0)
RDW: 14.4 % (ref 11.5–15.5)

## 2011-06-18 LAB — CBC
MCHC: 33.2
RBC: 3.39 — ABNORMAL LOW
WBC: 13.9 — ABNORMAL HIGH

## 2011-06-18 NOTE — Discharge Summary (Signed)
NAMEASHUNTI, SCHOFIELD             ACCOUNT NO.:  [id number]  MEDICAL RECORD NO.:  [id number]  LOCATION:  3715                         FACILITY:  [facility]  PHYSICIAN:  Elvert Cumpton I Ronesha Heenan, MD      DATE OF BIRTH:  1970/10/20  DATE OF ADMISSION:  05/04/2011 DATE OF DISCHARGE:  05/09/2011                              DISCHARGE SUMMARY   DISCHARGE DIAGNOSES: 1. Upper airway infection/laryngeal tracheitis. 2. Asthma exacerbation. 3. Ongoing smoking. 4. Diabetes mellitus. 5. Hypertension. 6. Atypical chest pain felt to be secondary to coughing and pleuritic     in nature. 7. Leukocytosis secondary to steroid. 8. Hypokalemia.  CONSULTATION:  Pulmonary consulted done by Dr. Molli Knock.  PROCEDURE:  Chest x-ray, low lung volume.  HISTORY OF PRESENT ILLNESS: 1. This is a 40 year old African American female who is active smoker,     history of asthma, presented with 1-week history of progressive     shortness of breath, and presented with pleuritic chest pain, with     negative troponin and cough yellowish of sputum production.  On     presentation, the patient's vital signs, O2 saturation 100% on 2L,     respiratory rate 18, afebrile, blood pressure 147/98.  She has     actively wheezing, especially on upper airway and she was using her     accessory muscle and unable to complete full sentences.  The     patient was admitted with a diagnosis of upper airway infection in     addition to asthma exacerbation.  The patient was placed on IV     steroids and given IV magnesium and neb treatment.  The patient     despite all above continued to have wheezing and coughing,     requesting Pulmonary consult and per the Pulmonology's     recommendation the patient need to have a follow up with ENT.  She     previously evaluated by ENT and some adjustment of her medication     done, one of them is to hold ACE inhibitor in the setting of upper     airway irritation.  Recommending discontinue Advair and  starting     Symbicort and avoid any nebulizer with powder also stressing again     his smoke cessation.  Her wheezing improved, but she is still     coughing, and with pleuritic chest pain, the patient will receive     some Robitussin and Tessalon for this.  The patient has an     appointment to see Dr. Levy Pupa in the Pulmonary office for     further evaluation and probable pulmonary function test. 2. Chest pain, felt to be atypical, secondary to muscular and above     listed problem.  Cardiac enzymes cycled which was negative and D-     dimer was negative. 3. Leukocytosis, secondary to steroid. 4. Diabetes mellitus, the patient's hemoglobin A1c 7.7, and need to be     further adjusted with patient's primary care. 5. Hypertension, benazepril discontinued and the patient will be     discharged on hydrochlorothiazide 12.5 mg p.o. b.i.d.  Currently,     it was felt that the  patient improved clinically and she will need     to follow up with her pulmonologist and ENT.  DISCHARGE MEDICATIONS: 1. Benzonatate 100 mg p.o. 3 times as needed. 2. Symbicort 2 puffs twice daily. 3. Robitussin DM 5 mL q.6 hours as needed. 4. Hydrochlorothiazide 12.5 mg twice daily. 5. Nexium patch. 6. Prednisone 20 mg taper dose. 7. Avelox 500 mg p.o. daily for 7 days. 8. Ventolin 2 puffs q.6 hours as needed. 9. Hydrocodone/APAP 5/500 every 4 hours as needed. 10.Metformin 500 mg daily. 11.Neurontin 300 mg 1-2 tablets twice daily. 12.Sertraline 25 mg daily. 13.Simvastatin 10 mg p.o. nightly. 14.Topiramate 100 mg at bedtime. 15.Zyrtec 10 mg daily.     Prayan Ulin Bosie Helper, MD     HIE/MEDQ  D:  05/09/2011  T:  05/09/2011  Job:  [id number]  cc:   Mina Marble, M.D. Leslye Peer, MD  Electronically Signed by Ebony Cargo MD on 06/18/2011 10:33:25 AM

## 2011-06-20 ENCOUNTER — Ambulatory Visit: Payer: Self-pay | Admitting: Emergency Medicine

## 2011-06-25 LAB — CBC
HCT: 36.4
MCV: 83.9
RBC: 4.33
WBC: 6.5

## 2011-06-25 LAB — DIFFERENTIAL
Basophils Absolute: 0
Lymphocytes Relative: 45
Neutro Abs: 2.9
Neutrophils Relative %: 45

## 2011-06-25 LAB — COMPREHENSIVE METABOLIC PANEL
BUN: 13
CO2: 25
Chloride: 106
Creatinine, Ser: 1.07
GFR calc non Af Amer: 57 — ABNORMAL LOW
Total Bilirubin: 0.5

## 2011-06-25 LAB — SALICYLATE LEVEL: Salicylate Lvl: <4

## 2011-06-25 LAB — URINALYSIS, ROUTINE W REFLEX MICROSCOPIC
Hgb urine dipstick: NEGATIVE
Protein, ur: NEGATIVE
Urobilinogen, UA: 0.2

## 2011-06-25 LAB — RAPID URINE DRUG SCREEN, HOSP PERFORMED
Amphetamines: NOT DETECTED
Barbiturates: NOT DETECTED
Benzodiazepines: NOT DETECTED
Opiates: NOT DETECTED

## 2011-06-25 LAB — URINE MICROSCOPIC-ADD ON

## 2011-07-04 LAB — CBC
HCT: 35 — ABNORMAL LOW
HCT: 32.7 — ABNORMAL LOW
Hemoglobin: 11.7 — ABNORMAL LOW
Hemoglobin: 11.1 — ABNORMAL LOW
MCV: 82
RBC: 3.99
RDW: 16.7 — ABNORMAL HIGH
WBC: 7.3

## 2011-07-04 LAB — DIFFERENTIAL
Eosinophils Absolute: 0.4
Eosinophils Absolute: 0.3
Lymphocytes Relative: 38
Lymphs Abs: 3.1
Lymphs Abs: 3.4 — ABNORMAL HIGH
Monocytes Absolute: 0.7
Monocytes Relative: 9
Neutro Abs: 4.1
Neutrophils Relative %: 51
Neutrophils Relative %: 39 — ABNORMAL LOW

## 2011-07-04 LAB — POCT I-STAT CREATININE
Creatinine, Ser: 1.2
Creatinine, Ser: 1.2
Operator id: 277751
Operator id: 277751

## 2011-07-04 LAB — COMPREHENSIVE METABOLIC PANEL
BUN: 7
CO2: 25
Calcium: 9.1
Creatinine, Ser: 1.02
GFR calc non Af Amer: >60
Glucose, Bld: 106 — ABNORMAL HIGH
Total Protein: 7.7

## 2011-07-04 LAB — I-STAT 8, (EC8 V) (CONVERTED LAB)
Acid-base deficit: 3 — ABNORMAL HIGH
BUN: 10
Chloride: 105
Potassium: 3.6
TCO2: 24
pCO2, Ven: 40.5 — ABNORMAL LOW
pCO2, Ven: 46.6
pH, Ven: 7.301 — ABNORMAL HIGH
pH, Ven: 7.355 — ABNORMAL HIGH

## 2011-07-04 LAB — POCT PREGNANCY, URINE: Operator id: NEGATIVE

## 2011-07-10 IMAGING — US US ABDOMEN COMPLETE
1 series · 14 of 25 positions shown · non-contrast
Comparison: CT abdomen pelvis 05/24/2007

The *RADIOLOGY REPORT*
CLINICAL DATA: Nausea

ABDOMINAL ULTRASOUND COMPLETE

[Series 1: us abdomen complete · 0.30mm/px · 14 of 70 slices shown]
[im 1/70]
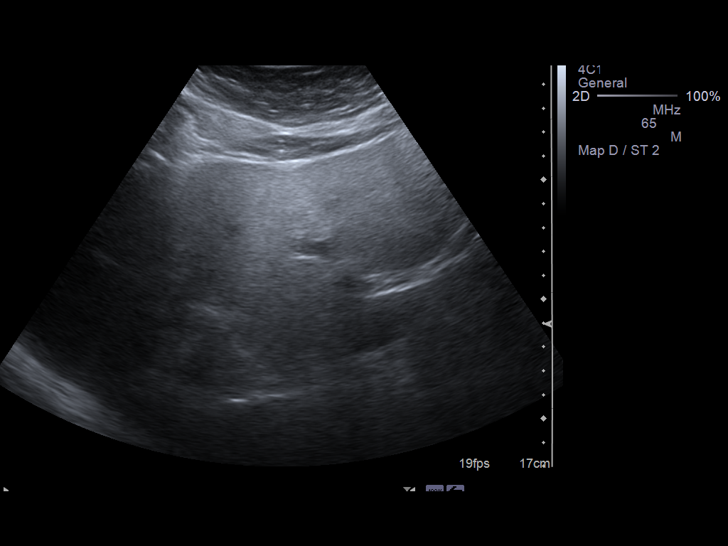
[im 6/70]
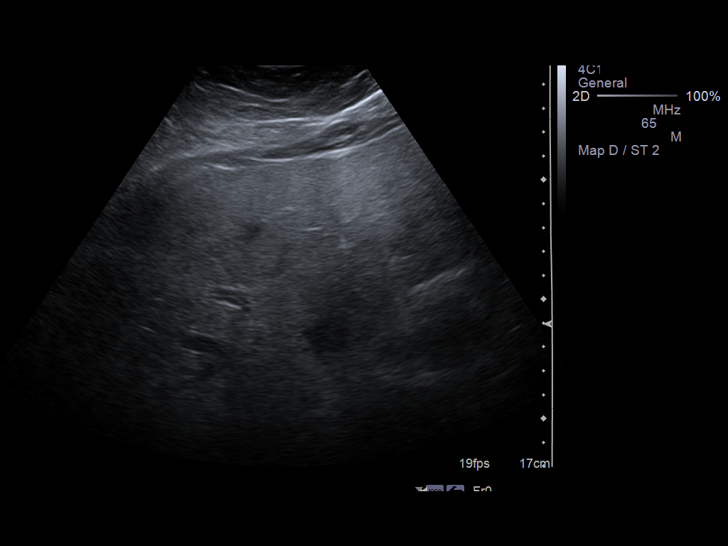
[im 12/70]
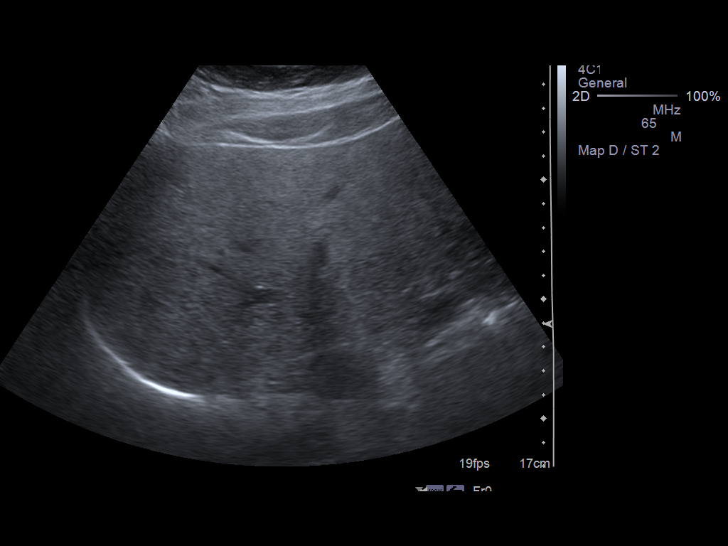
[im 18/70]
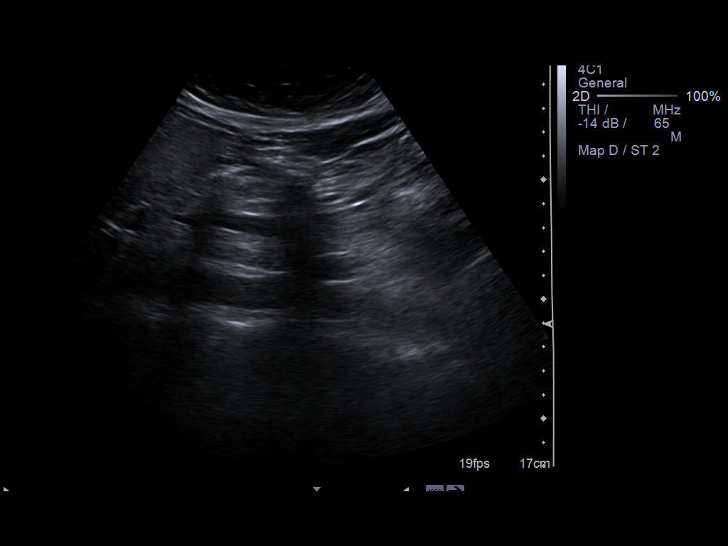
[im 24/70]
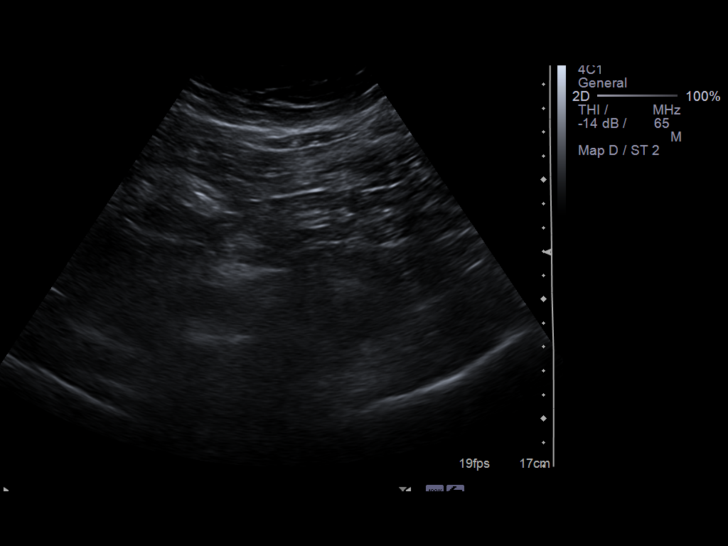
[im 26/70]
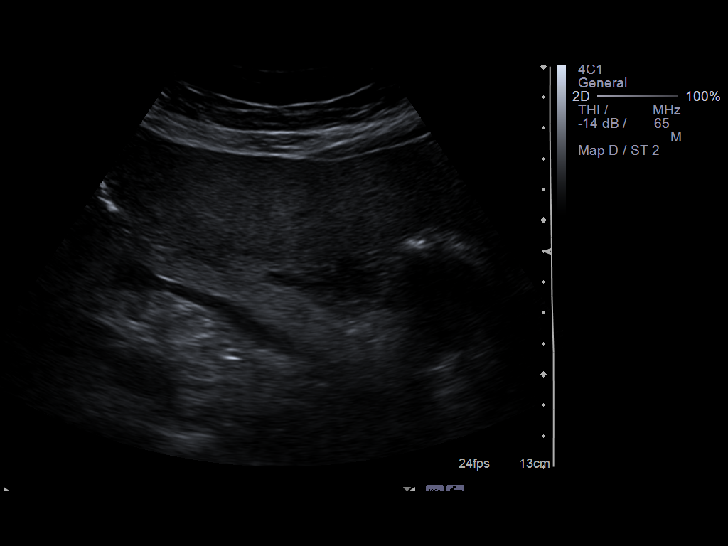
[im 32/70]
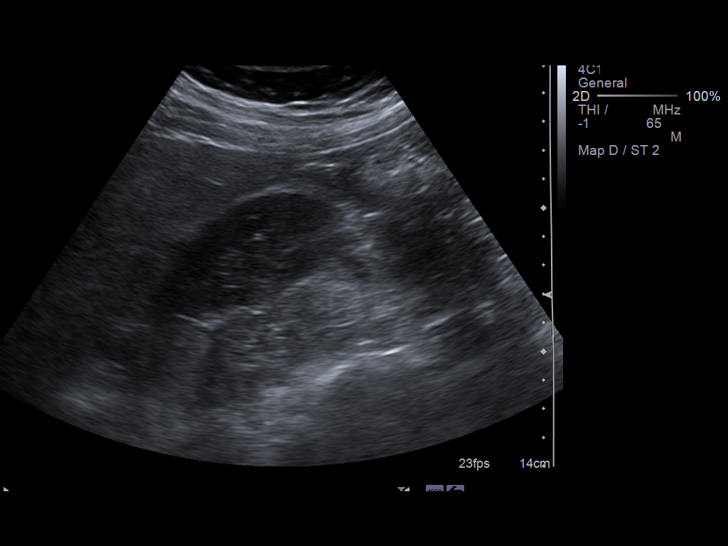
[im 38/70]
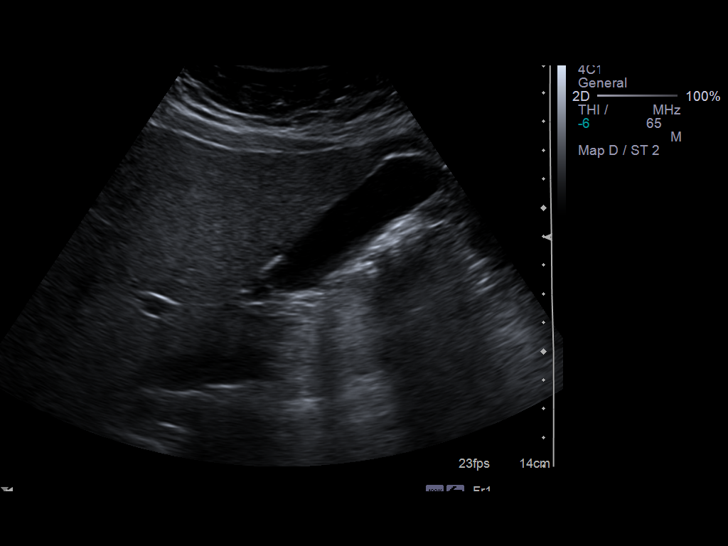
[im 44/70]
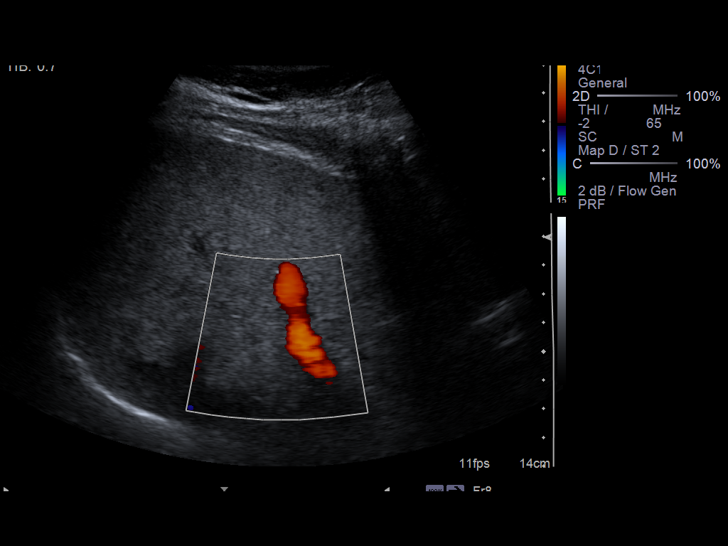
[im 47/70]
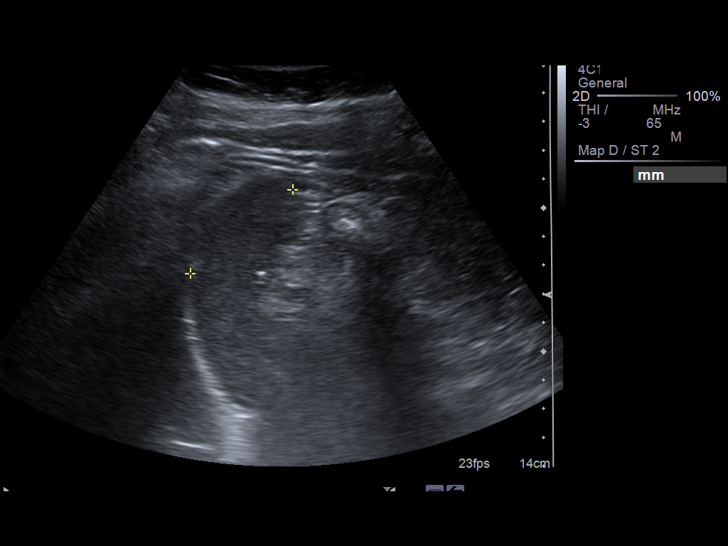
[im 52/70]
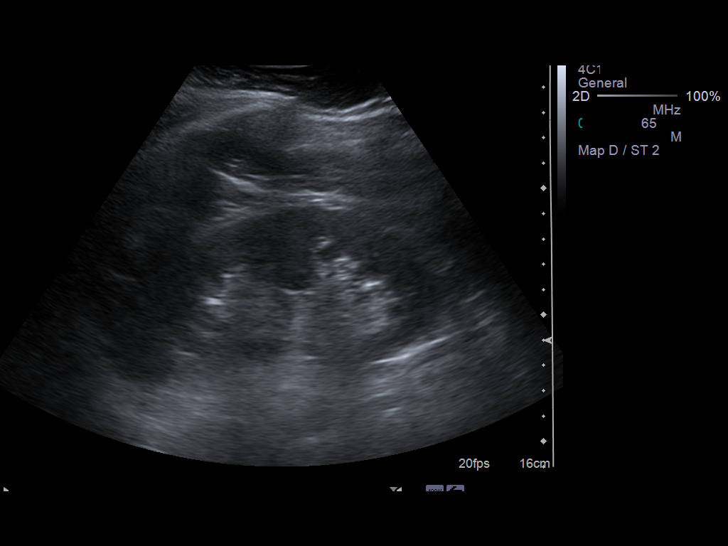
[im 58/70]
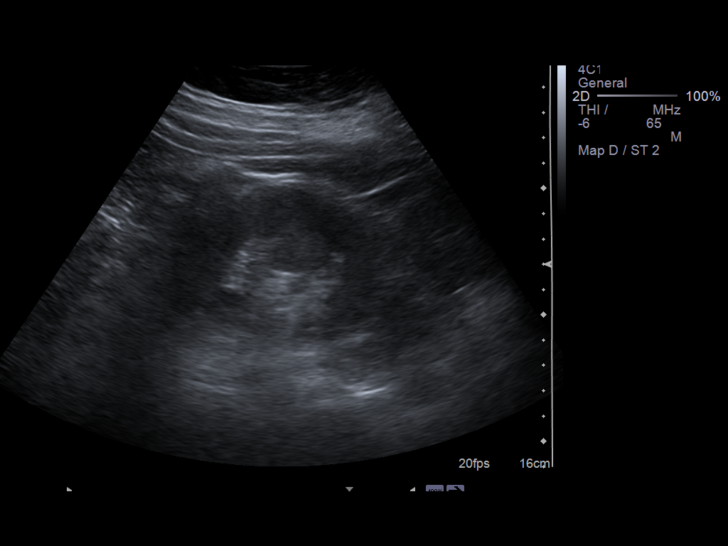
[im 64/70]
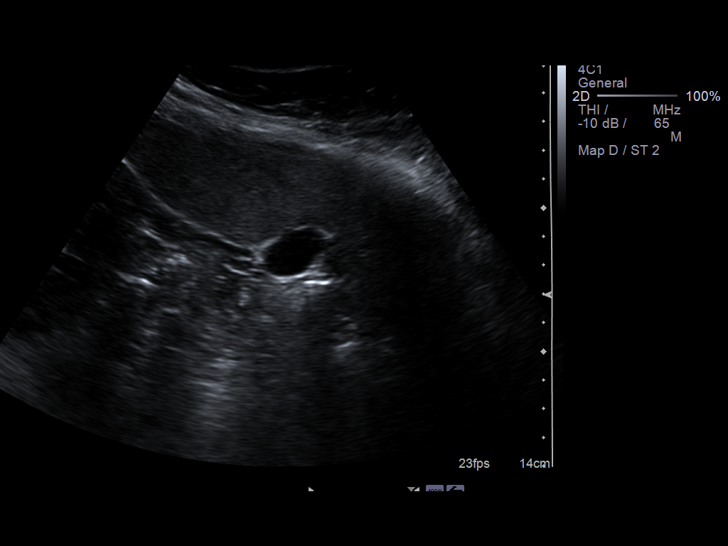
[im 70/70]
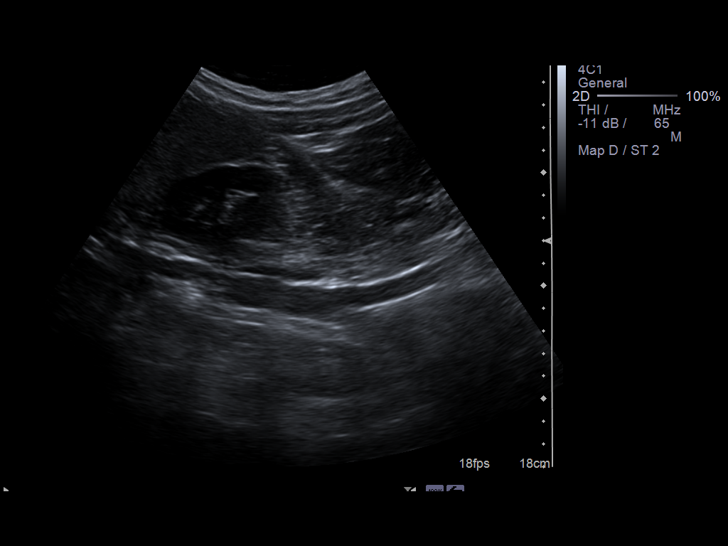

[14 of 25 positions shown; findings below may reference images not displayed]

FINDINGS: Gallbladder:  No gallstones, gallbladder wall thickening, or
pericholecystic fluid.

Common Bile Duct:  Within normal limits in caliber.

Liver: There is increased echogenicity of the liver, suggesting
fatty infiltration.  No focal liver lesion or intrahepatic biliary
ductal dilatation.

IVC:  Appears normal.

Pancreas:  Although the pancreas is difficult to visualize in its
entirety, no focal pancreatic abnormality is identified.

Spleen:  Within normal limits in size and echotexture.

Right kidney:  Normal in size and parenchymal echogenicity.  No
evidence of mass or hydronephrosis.

Left kidney:  Normal in size and parenchymal echogenicity.  No
evidence of mass or hydronephrosis.

Abdominal Aorta:  No aneurysm identified.
IMPRESSION: 1.  Fatty infiltration of the liver.
2.  Otherwise negative abdominal ultrasound.

## 2011-07-24 ENCOUNTER — Ambulatory Visit: Payer: Self-pay | Admitting: Emergency Medicine

## 2011-08-26 ENCOUNTER — Ambulatory Visit: Payer: Medicaid Other | Admitting: Emergency Medicine

## 2011-08-31 ENCOUNTER — Observation Stay (HOSPITAL_COMMUNITY)
Admission: EM | Admit: 2011-08-31 | Discharge: 2011-08-31 | Disposition: A | Discharge status: Home / Self Care | Payer: Medicaid Other | Attending: Emergency Medicine | Admitting: Emergency Medicine

## 2011-08-31 ENCOUNTER — Encounter (HOSPITAL_COMMUNITY): Payer: Self-pay | Admitting: Cardiology

## 2011-08-31 ENCOUNTER — Emergency Department (HOSPITAL_COMMUNITY): Payer: Medicaid Other

## 2011-08-31 ENCOUNTER — Other Ambulatory Visit: Payer: Self-pay

## 2011-08-31 DIAGNOSIS — R0602 Shortness of breath: Secondary | ICD-10-CM | POA: Insufficient documentation

## 2011-08-31 DIAGNOSIS — I10 Essential (primary) hypertension: Secondary | ICD-10-CM | POA: Insufficient documentation

## 2011-08-31 DIAGNOSIS — R079 Chest pain, unspecified: Principal | ICD-10-CM | POA: Insufficient documentation

## 2011-08-31 DIAGNOSIS — R51 Headache: Secondary | ICD-10-CM | POA: Insufficient documentation

## 2011-08-31 DIAGNOSIS — J40 Bronchitis, not specified as acute or chronic: Secondary | ICD-10-CM

## 2011-08-31 DIAGNOSIS — E119 Type 2 diabetes mellitus without complications: Secondary | ICD-10-CM | POA: Insufficient documentation

## 2011-08-31 DIAGNOSIS — R509 Fever, unspecified: Secondary | ICD-10-CM | POA: Insufficient documentation

## 2011-08-31 LAB — BASIC METABOLIC PANEL WITH GFR
BUN: 10 mg/dL (ref 6–23)
CO2: 22 meq/L (ref 19–32)
Calcium: 9.4 mg/dL (ref 8.4–10.5)
Chloride: 100 meq/L (ref 96–112)
Creatinine, Ser: 0.91 mg/dL (ref 0.50–1.10)
GFR calc Af Amer: 90 mL/min — ABNORMAL LOW
GFR calc non Af Amer: 77 mL/min — ABNORMAL LOW
Glucose, Bld: 137 mg/dL — ABNORMAL HIGH (ref 70–99)
Potassium: 3 meq/L — ABNORMAL LOW (ref 3.5–5.1)
Sodium: 137 meq/L (ref 135–145)

## 2011-08-31 LAB — DIFFERENTIAL
Basophils Absolute: 0 K/uL (ref 0.0–0.1)
Basophils Relative: 0 % (ref 0–1)
Eosinophils Absolute: 0 K/uL (ref 0.0–0.7)
Eosinophils Relative: 0 % (ref 0–5)
Lymphocytes Relative: 21 % (ref 12–46)
Lymphs Abs: 1.9 K/uL (ref 0.7–4.0)
Monocytes Absolute: 0.6 K/uL (ref 0.1–1.0)
Monocytes Relative: 7 % (ref 3–12)
Neutro Abs: 6.5 K/uL (ref 1.7–7.7)
Neutrophils Relative %: 71 % (ref 43–77)

## 2011-08-31 LAB — CBC
HCT: 34.1 % — ABNORMAL LOW (ref 36.0–46.0)
Hemoglobin: 11.5 g/dL — ABNORMAL LOW (ref 12.0–15.0)
MCH: 28.8 pg (ref 26.0–34.0)
MCHC: 33.7 g/dL (ref 30.0–36.0)
MCV: 85.3 fL (ref 78.0–100.0)
Platelets: 294 10*3/uL (ref 150–400)
RBC: 4 MIL/uL (ref 3.87–5.11)
RDW: 13.9 % (ref 11.5–15.5)
WBC: 9.1 10*3/uL (ref 4.0–10.5)

## 2011-08-31 LAB — URINALYSIS, ROUTINE W REFLEX MICROSCOPIC
Bilirubin Urine: NEGATIVE
Glucose, UA: NEGATIVE mg/dL
Hgb urine dipstick: NEGATIVE
Ketones, ur: NEGATIVE mg/dL
Leukocytes, UA: NEGATIVE
Nitrite: NEGATIVE
Protein, ur: NEGATIVE mg/dL
Specific Gravity, Urine: 1.011 (ref 1.005–1.030)
Urobilinogen, UA: 0.2 mg/dL (ref 0.0–1.0)
pH: 7.5 (ref 5.0–8.0)

## 2011-08-31 MED ORDER — ALBUTEROL SULFATE (5 MG/ML) 0.5% IN NEBU
5.0000 mg | INHALATION_SOLUTION | Freq: Once | RESPIRATORY_TRACT | Status: AC
  Administered 2011-08-31: 5 mg via RESPIRATORY_TRACT
  Filled 2011-08-31: qty 1

## 2011-08-31 MED ORDER — IPRATROPIUM BROMIDE 0.02 % IN SOLN
0.5000 mg | Freq: Once | RESPIRATORY_TRACT | Status: AC
  Administered 2011-08-31: 0.5 mg via RESPIRATORY_TRACT
  Filled 2011-08-31: qty 2.5

## 2011-08-31 MED ORDER — MORPHINE SULFATE 2 MG/ML IJ SOLN
INTRAMUSCULAR | Status: AC
  Filled 2011-08-31: qty 1

## 2011-08-31 MED ORDER — ALBUTEROL (5 MG/ML) CONTINUOUS INHALATION SOLN
10.0000 mg/h | INHALATION_SOLUTION | Freq: Once | RESPIRATORY_TRACT | Status: AC
  Administered 2011-08-31: 10 mg/h via RESPIRATORY_TRACT
  Filled 2011-08-31: qty 20

## 2011-08-31 MED ORDER — HYDROCOD POLST-CHLORPHEN POLST 10-8 MG/5ML PO LQCR
5.0000 mL | Freq: Once | ORAL | Status: AC
  Administered 2011-08-31: 5 mL via ORAL
  Filled 2011-08-31: qty 5

## 2011-08-31 MED ORDER — POTASSIUM CHLORIDE CRYS ER 20 MEQ PO TBCR
40.0000 meq | EXTENDED_RELEASE_TABLET | Freq: Once | ORAL | Status: AC
  Administered 2011-08-31: 40 meq via ORAL
  Filled 2011-08-31: qty 2

## 2011-08-31 MED ORDER — PREDNISONE (PAK) 10 MG PO TABS: 10.0000 mg | ORAL_TABLET | Freq: Every day | ORAL | Status: DC

## 2011-08-31 MED ORDER — MORPHINE SULFATE 4 MG/ML IJ SOLN
6.0000 mg | Freq: Once | INTRAMUSCULAR | Status: AC
  Administered 2011-08-31: 6 mg via INTRAVENOUS
  Filled 2011-08-31: qty 1

## 2011-08-31 MED ORDER — PREDNISONE 20 MG PO TABS
60.0000 mg | ORAL_TABLET | Freq: Once | ORAL | Status: AC
Start: 2011-08-31 — End: 2011-08-31
  Administered 2011-08-31: 60 mg via ORAL
  Filled 2011-08-31: qty 3

## 2011-08-31 MED ORDER — ALBUTEROL SULFATE (5 MG/ML) 0.5% IN NEBU
INHALATION_SOLUTION | RESPIRATORY_TRACT | Status: AC
  Administered 2011-08-31: 10:00:00
  Filled 2011-08-31: qty 1

## 2011-08-31 MED ORDER — SODIUM CHLORIDE 0.9 % IV BOLUS (SEPSIS)
1000.0000 mL | Freq: Once | INTRAVENOUS | Status: AC
  Administered 2011-08-31: 1000 mL via INTRAVENOUS

## 2011-08-31 MED ORDER — ALBUTEROL (5 MG/ML) CONTINUOUS INHALATION SOLN: 10.0000 mg/h | INHALATION_SOLUTION | Freq: Once | RESPIRATORY_TRACT | Status: DC

## 2011-08-31 MED ORDER — KETOROLAC TROMETHAMINE 30 MG/ML IJ SOLN
30.0000 mg | Freq: Once | INTRAMUSCULAR | Status: AC
  Administered 2011-08-31: 30 mg via INTRAVENOUS

## 2011-08-31 MED ORDER — KETOROLAC TROMETHAMINE 30 MG/ML IJ SOLN
INTRAMUSCULAR | Status: AC
  Filled 2011-08-31: qty 1

## 2011-08-31 MED ORDER — ALBUTEROL SULFATE (5 MG/ML) 0.5% IN NEBU
INHALATION_SOLUTION | RESPIRATORY_TRACT | Status: AC
  Administered 2011-08-31: 10 mg
  Filled 2011-08-31: qty 2

## 2011-08-31 MED ORDER — SODIUM CHLORIDE 0.9 % IV SOLN
Freq: Once | INTRAVENOUS | Status: AC
  Administered 2011-08-31: 18:00:00 via INTRAVENOUS

## 2011-08-31 MED ORDER — HYDROCOD POLST-CHLORPHEN POLST 10-8 MG/5ML PO LQCR: 5.0000 mL | Freq: Two times a day (BID) | ORAL | Status: DC | PRN

## 2011-08-31 MED ORDER — AZITHROMYCIN 250 MG PO TABS: 250.0000 mg | ORAL_TABLET | Freq: Every day | ORAL | Status: AC

## 2011-08-31 NOTE — ED Notes (Signed)
Diet tray ordered per pt request.

## 2011-08-31 NOTE — ED Provider Notes (Signed)
5:29 PM Patient in CDU under observation, asthma protocol.  Patient reports improvement in her breathing but continued tightness and pain in her chest.  O2 saturation is 97% on room air.  On exam, pt is A&Ox4, NAD, RRR, lungs with very slight expiratory wheeze on right + cough.  Have reordered neb treatment and pain medication.  Will continue to follow.   7:22 PM Patient reports she is improving with nebulizer treatments.  Daughter estimates she needs to be on protocol for about 2 more hours.  Patient states she has inhaler at home but will not be able to fill her prescriptions until tomorrow morning because she has a specific pharmacy she has to go to.    8:57 PM After hour long neb patient reports she would like to go home.  On exam, pt is A&Ox4, NAD, RRR, CTAB with cough, moving air well in all fields.     Dillard Cannon [place], [place] 08/31/11 [phone]

## 2011-08-31 NOTE — ED Notes (Signed)
Pt NAD, resp e/u, Aox4. Pt states understanding of discharge instructions and denies questions. Pt ambulatory with steady gait. All at time of discharge.

## 2011-08-31 NOTE — ED Provider Notes (Signed)
See prior note    Ward Givens, MD 08/31/11 1807

## 2011-08-31 NOTE — Progress Notes (Signed)
RT did peak flow with patient. 1st attempt 270, 2nd attempt and 3rd attempt both 260.  Patient still wheezing at this time.  RT will continue to monitor.

## 2011-08-31 NOTE — ED Notes (Signed)
Pt reports that she has had a cough for the past couple of weeks, also c/o chest wall pain. Pt denies any n/v or diaphoresis with the pain. bp- 134/74 Hr- 126. Breath tx- 5mg  albuterol. No acute distress noted

## 2011-08-31 NOTE — ED Notes (Signed)
Phineas Real, RT, aware pt on Asthma Protocol w/peak flow.

## 2011-08-31 NOTE — ED Notes (Signed)
Told Pt that I needed  A urine sample. Pt stated she did not have to go. Will attempt again soon.

## 2011-08-31 NOTE — ED Provider Notes (Signed)
History     CSN: [id number] Arrival date & time: 08/31/2011  9:43 AM   First MD Initiated Contact with Patient 08/31/11 [phone]      Chief Complaint  Patient presents with  . Chest Pain  . Shortness of Breath    (Consider location/radiation/quality/duration/timing/severity/associated sxs/prior treatment) HPI Comments: 40 y.o. Female presents to the emergency department c/o 3-4 days of fever, headache, congestion, rhinorrhea, cough, chest wall pain, shortness of breath, nausea, vomiting and diarrhea.  Pt denies dizziness, syncope, near syncope and abdominal pain.  Pt states she has gotten steadily worse over the 4 days with increasing chest pain.  Pain is a 10/10 burning/aching in nature and worsened with movement, cough, deep breathing and palpation.  PMHx asthma, diabetes, HTN, migraine and carpel tunnel.  Pt states she has been coughing for a few weeks prior to this episode.  Use of her home albuterol neb and inhalers have given no relief from chest pain or shortness of breath,    Patient is a 40 y.o. female presenting with chest pain and shortness of breath. The history is provided by the patient.  Chest Pain The chest pain began 2 days ago. Chest pain occurs constantly. The chest pain is unchanged. The pain is associated with breathing and coughing. At its most intense, the pain is at 10/10. The pain is currently at 10/10. The severity of the pain is moderate. The quality of the pain is described as aching and burning. The pain radiates to the lower back, mid back and upper back. Chest pain is worsened by deep breathing. Primary symptoms include a fever, fatigue, shortness of breath, cough, wheezing, nausea and vomiting. Pertinent negatives for primary symptoms include no syncope, no dizziness and no altered mental status.  The shortness of breath began more than 2 days ago. The shortness of breath developed gradually. The shortness of breath is moderate. The patient's medical history is  significant for asthma. The patient's medical history does not include CHF or COPD.  The cough began 3 to 5 days ago. The cough is productive. There is nondescript sputum produced.  Wheezing began more than 2 days ago. The wheezing has been gradually worsening since its onset. The patient's medical history is significant for asthma. The patient's medical history does not include COPD.  Associated symptoms include lower extremity edema.  Pertinent negatives for associated symptoms include no diaphoresis, no near-syncope, no numbness, no orthopnea, no paroxysmal nocturnal dyspnea and no weakness. Risk factors include obesity.  Her past medical history is significant for hypertension.    Shortness of Breath  Associated symptoms include chest pain, a fever, cough, shortness of breath and wheezing. Pertinent negatives include no orthopnea. Her past medical history is significant for asthma.    Past Medical History  Diagnosis Date  . DM (diabetes mellitus)   . Hypertension   . Hypokalemia   . Asthma     Past Surgical History  Procedure Date  . Cesarean section     x 3  . Appendectomy   . Vesicovaginal fistula closure w/ tah 2009  . Hernia repair     Family History  Problem Relation Age of Onset  . Heart attack Mother   . Stroke Mother   . Diabetes Mother   . Hypertension Mother   . Arthritis Mother   . Stroke Father   . Hypertension Sister   . Diabetes Sister   . Seizures Brother   . Diabetes Brother     History  Substance Use  Topics  . Smoking status: Former Smoker -- 0.3 packs/day for 22 years    Types: Cigarettes    Quit date: 05/01/2011  . Smokeless tobacco: Never Used  . Alcohol Use: Yes     rare    OB History    Grav Para Term Preterm Abortions TAB SAB Ect Mult Living                  Review of Systems  Constitutional: Positive for fever and fatigue. Negative for diaphoresis.  Respiratory: Positive for cough, shortness of breath and wheezing.     Cardiovascular: Positive for chest pain. Negative for orthopnea, syncope and near-syncope.  Gastrointestinal: Positive for nausea and vomiting.  Neurological: Negative for dizziness, weakness and numbness.  Psychiatric/Behavioral: Negative for altered mental status.   All pertinent positives and negatives in the history of present illness  Allergies  Review of patient's allergies indicates no known allergies.  Home Medications   Current Outpatient Rx  Name Route Sig Dispense Refill  . ALBUTEROL SULFATE HFA 108 (90 BASE) MCG/ACT IN AERS Inhalation Inhale 2 puffs into the lungs every 6 (six) hours as needed.      . BUDESONIDE-FORMOTEROL FUMARATE 160-4.5 MCG/ACT IN AERO Inhalation Inhale 2 puffs into the lungs 2 (two) times daily.      Marland Kitchen CETIRIZINE HCL 10 MG PO TABS Oral Take 10 mg by mouth daily.      Marland Kitchen HYDROCHLOROTHIAZIDE 12.5 MG PO TABS Oral Take 12.5 mg by mouth 2 (two) times daily.      Marland Kitchen HYDROCODONE-ACETAMINOPHEN 5-500 MG PO TABS Oral Take 1 tablet by mouth every 4 (four) hours as needed.      Marland Kitchen METFORMIN HCL 500 MG PO TABS Oral Take 500 mg by mouth daily.      . SERTRALINE HCL 25 MG PO TABS Oral Take 25 mg by mouth daily.      Marland Kitchen SIMVASTATIN 10 MG PO TABS Oral Take 10 mg by mouth at bedtime.      . TOPIRAMATE 100 MG PO TABS Oral Take 100 mg by mouth at bedtime.        BP 120/76  Pulse 129  Temp(Src) 100.1 F (37.8 C) (Oral)  Resp 26  SpO2 100%  Physical Exam  Constitutional: She is oriented to person, place, and time. She appears well-developed and well-nourished.  HENT:  Head: Normocephalic and atraumatic.  Right Ear: Tympanic membrane, external ear and ear canal normal.  Left Ear: Tympanic membrane, external ear and ear canal normal.  Nose: Mucosal edema and rhinorrhea present.  Mouth/Throat: Uvula is midline, oropharynx is clear and moist and mucous membranes are normal. No oropharyngeal exudate.  Neck: Normal range of motion. Neck supple.  Cardiovascular: Normal  rate, regular rhythm, normal heart sounds and intact distal pulses.  Exam reveals no gallop and no friction rub.   No murmur heard. Pulmonary/Chest: Tachypnea noted. She has decreased breath sounds in the right lower field and the left lower field. She has wheezes in the right upper field, the right middle field, the right lower field, the left upper field, the left middle field and the left lower field. She has no rhonchi. She has no rales.  Abdominal: Soft. Normal appearance and bowel sounds are normal. There is no tenderness.  Lymphadenopathy:    She has no cervical adenopathy.  Neurological: She is alert and oriented to person, place, and time.  Skin: Skin is warm and dry.  Psychiatric: She has a normal mood and affect. Her  behavior is normal. Judgment and thought content normal.    ED Course  Procedures (including critical care time)   Labs Reviewed  CBC  DIFFERENTIAL  BASIC METABOLIC PANEL  URINALYSIS, ROUTINE W REFLEX MICROSCOPIC     Patient has been improving here in the emergency department.  She did not conceive for further care for her asthma.  The patient's issue seems to be in a viral URI that has exacerbated her asthma based on her age and physical exam.  Patient seems to be very animated and dramatic on exam.  The family was asked to have one member of time in the room as a large group did seem to be making the patient's condition worse.  Patient referred to her managed in the CDU.   MDM     Date: 08/31/2011  Rate: 123  Rhythm: sinus tachycardia  QRS Axis: normal  Intervals: normal  ST/T Wave abnormalities: nonspecific T wave changes  Conduction Disutrbances:none  Narrative Interpretation:   Old EKG Reviewed: unchanged       Carlyle Dolly, PA-C 08/31/11 1554  Carlyle Dolly, PA-C 08/31/11 1600

## 2011-08-31 NOTE — ED Provider Notes (Signed)
History of  reactive airway disease. Patient has had one continuous nebulizer regular nebulizer treatment at the time of my exam. She appears to be tachypneic and has some mild retractions she has diminished breath sounds diffusely and some rare end expiratory wheezing/rhonchi.  Medical screening examination/treatment/procedure(s) were conducted as a shared visit with non-physician practitioner(s) and myself.  I personally evaluated the patient during the encounter Devoria Albe, MD, Franz Dell, MD 08/31/11 [phone]

## 2011-09-01 NOTE — ED Provider Notes (Signed)
See prior note   Rulon Abdalla L Clary Meeker, MD 09/01/11 1134 

## 2011-09-11 ENCOUNTER — Ambulatory Visit (INDEPENDENT_AMBULATORY_CARE_PROVIDER_SITE_OTHER): Payer: Medicaid Other | Admitting: Emergency Medicine

## 2011-09-11 ENCOUNTER — Encounter: Payer: Self-pay | Admitting: Emergency Medicine

## 2011-09-11 VITALS — BP 138/82 | HR 89 | Temp 98.4°F | Ht 61.0 in | Wt 241.0 lb

## 2011-09-11 DIAGNOSIS — J45909 Unspecified asthma, uncomplicated: Secondary | ICD-10-CM

## 2011-09-11 LAB — PULMONARY FUNCTION TEST

## 2011-09-11 MED ORDER — FLUTICASONE PROPIONATE 50 MCG/ACT NA SUSP: 2.0000 | Freq: Two times a day (BID) | NASAL | Status: DC

## 2011-09-11 MED ORDER — OMEPRAZOLE 20 MG PO CPDR: 20.0000 mg | DELAYED_RELEASE_CAPSULE | Freq: Every day | ORAL | Status: DC

## 2011-09-11 NOTE — Progress Notes (Signed)
PFT done today. 

## 2011-09-11 NOTE — Patient Instructions (Signed)
Please stop your Symbicort Use your albuterol 2 puffs as needed for shortness of breath Continue your zyrtec Start using Qnasl 2 sprays each nostril daily. If this medication helps you, then fill the generic prescription for fluticasone nasal spray and use 2 sprays each nostril twice a day. Start using omeprazole 20mg  daily  Follow with Dr Delton Coombes in 2 months to check your progress

## 2011-09-11 NOTE — Assessment & Plan Note (Signed)
PFT today show normal airflows and no BD response. This could reflect quiescent asthma, but I believe its more likely that she has primarily UA disease with recurrent trachitis, exacerbated by PND/allergies + GERD (untreated).   - stop symbicort - keep albuterol available prn - add nasal steroid to zyrtec - restart omeprazole (she was on remotely) - rov to assess in 2 months

## 2011-09-11 NOTE — Progress Notes (Signed)
  Subjective:    Patient ID: [name], female    DOB: 1971-07-10, 40 y.o.   MRN: [id number] HPI 40 yo smoker, hx Asthma dx in her 20's, and UA irritation. Hospitalized 8/12-8/17 with an exacerbation in the setting possible trachiitis, viral URI.  She tells me that she has in the past had many hospitalizations for flares. Has had PFT's in the past but not recently. She has been managed with Advair in the past, many other BD's. Sent out of the hospital on Symbicort bid, prn albuterol. She is on zyrtec, still has breakthrough allergy symptoms. She is scheduled for tonsillectomy and adenoidectomy in Sept 14.   ROV 09/11/11 -- follow up hx allergies, UA irritation, ? Asthma. Has been rx as asthma since in her 82's.  Tells me that she has had recurrent episode difficulty w her breathing, went to ED and rx for asthma exac - she had SOB, wheeze, severe cough. Received nebs with little response, pred, abx. Still on the prednisone. She had tonsilectomy and adenoidectomy as planned.       Objective:   Physical Exam  Gen: Pleasant, obese, in no distress,  Slightly anxious  ENT: No lesions,  mouth clear,  Very narrow post pharynx  Neck: No JVD, no TMG, no carotid bruits  Lungs: No use of accessory muscles, no dullness to percussion, clear without rales or rhonchi  Cardiovascular: RRR, heart sounds normal, no murmur or gallops, no peripheral edema  Musculoskeletal: No deformities, no cyanosis or clubbing  Neuro: alert, non focal  Skin: Warm, no lesions or rashes     Assessment & Plan:  Asthma with allergic rhinitis PFT today show normal airflows and no BD response. This could reflect quiescent asthma, but I believe its more likely that she has primarily UA disease with recurrent trachitis, exacerbated by PND/allergies + GERD (untreated).   - stop symbicort - keep albuterol available prn - add nasal steroid to zyrtec - restart omeprazole (she was on remotely) - rov to assess in 2  months

## 2011-11-11 ENCOUNTER — Ambulatory Visit: Payer: Medicaid Other | Admitting: Emergency Medicine

## 2011-11-29 IMAGING — CR DG CHEST 2V
2 series · 2 of 2 positions shown · non-contrast
Comparison: None

CLINICAL DATA: Pain, cough, shortness of breath.

CHEST - 2 VIEW

[w chest pa]
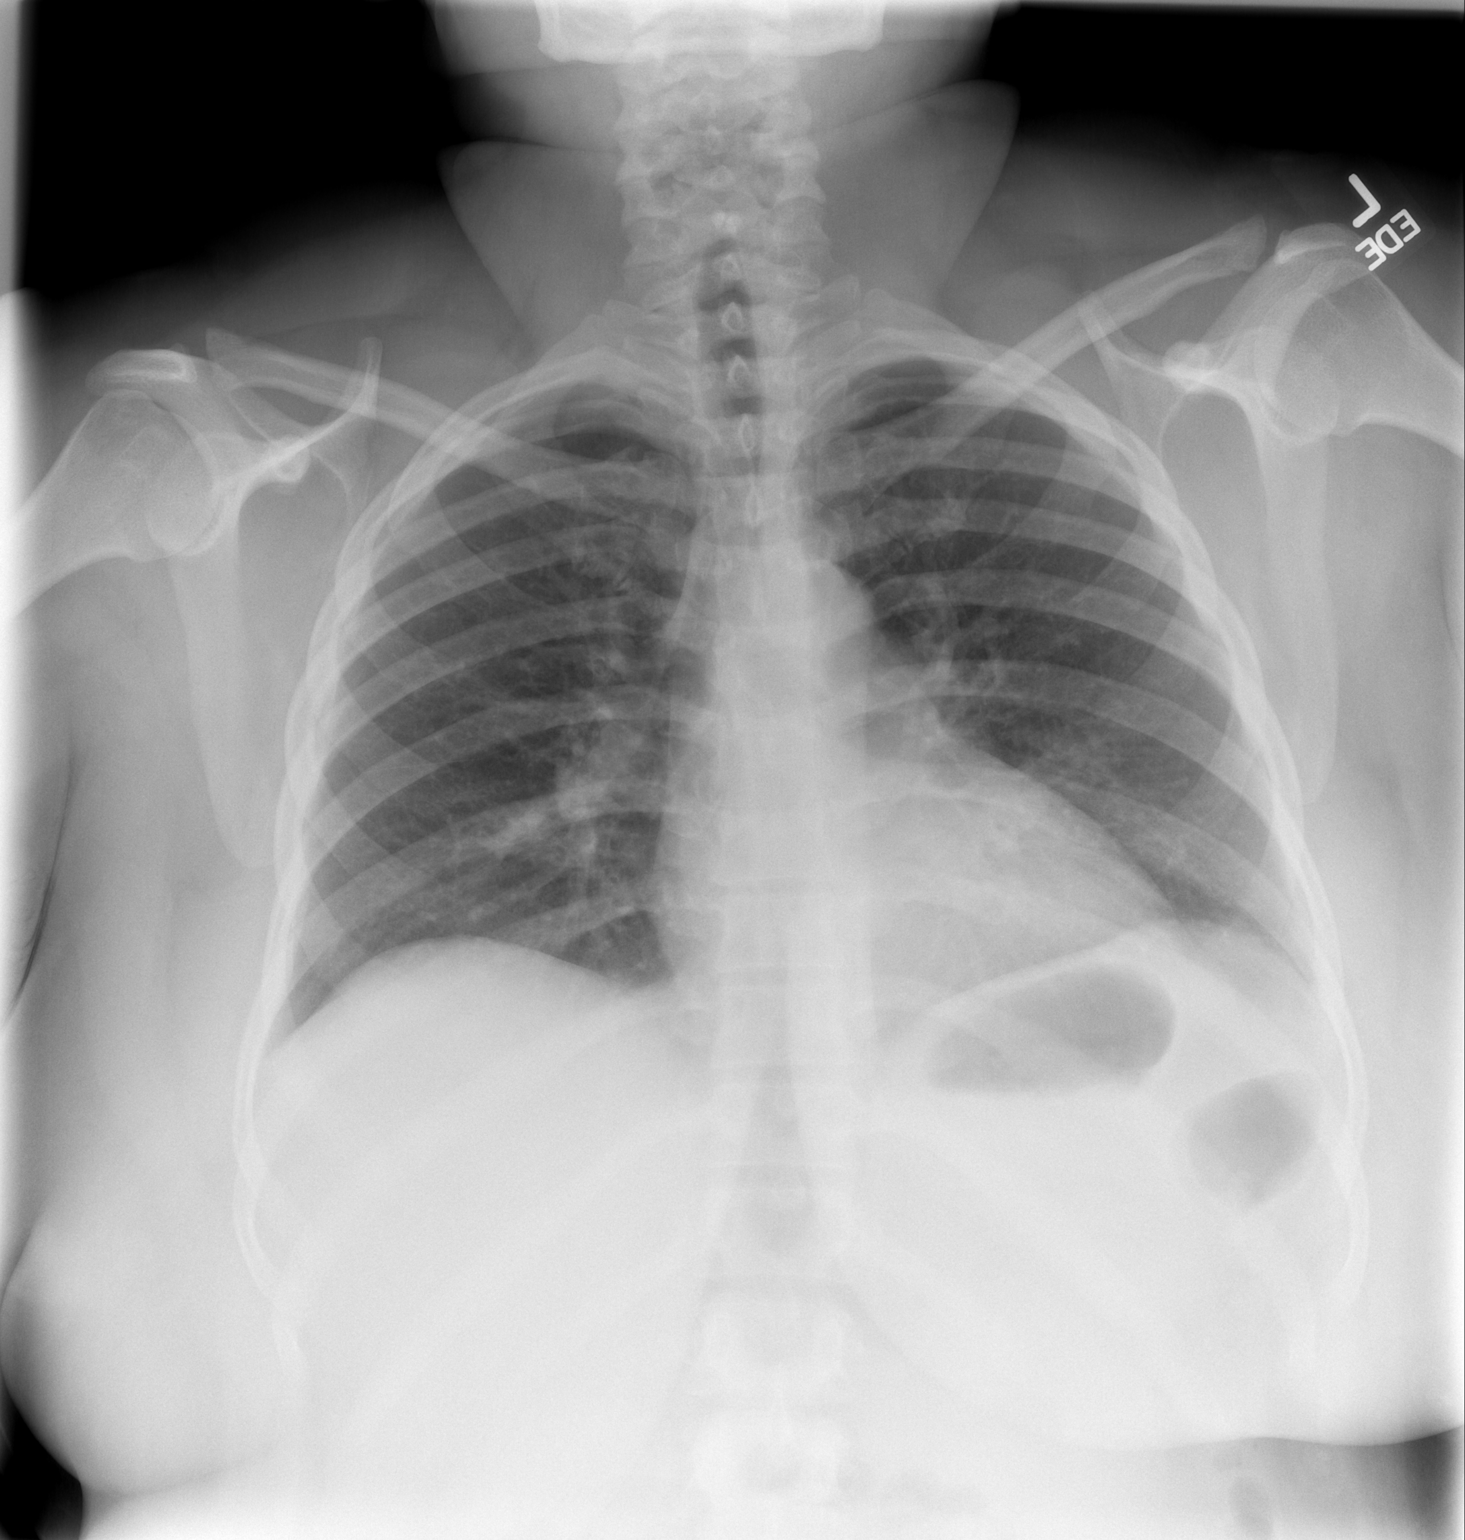

[w chest lat]
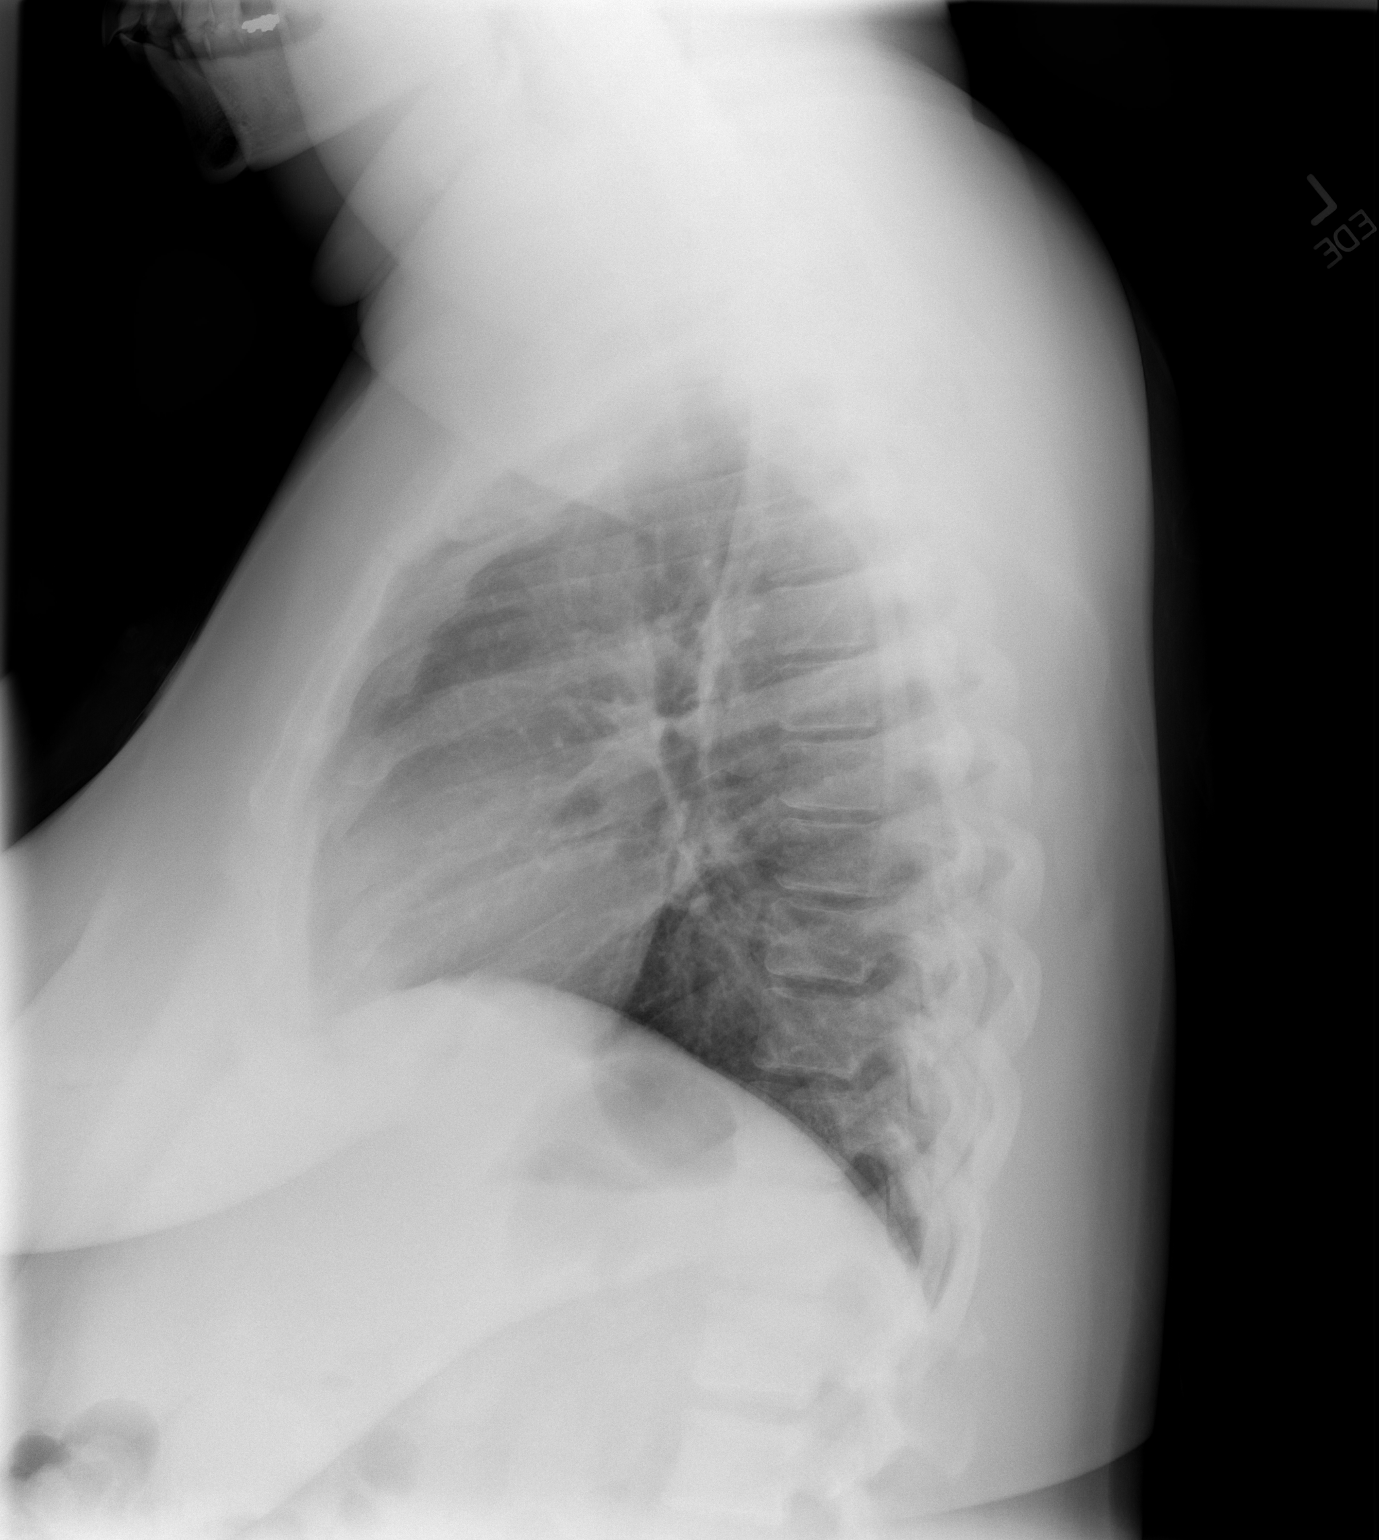

[2 of 2 positions shown; findings below may reference images not displayed]

FINDINGS: Heart and mediastinal contours are within normal limits.
No focal opacities or effusions.  No acute bony abnormality.
IMPRESSION: Normal study.

## 2011-12-02 IMAGING — CR DG CHEST 1V PORT
1 series · 1 of 1 positions shown · non-contrast
Comparison: 05/01/2011.

CLINICAL DATA: Severe wheezing.  Short of breath.  Asthma.

PORTABLE CHEST - 1 VIEW

[view not recorded]
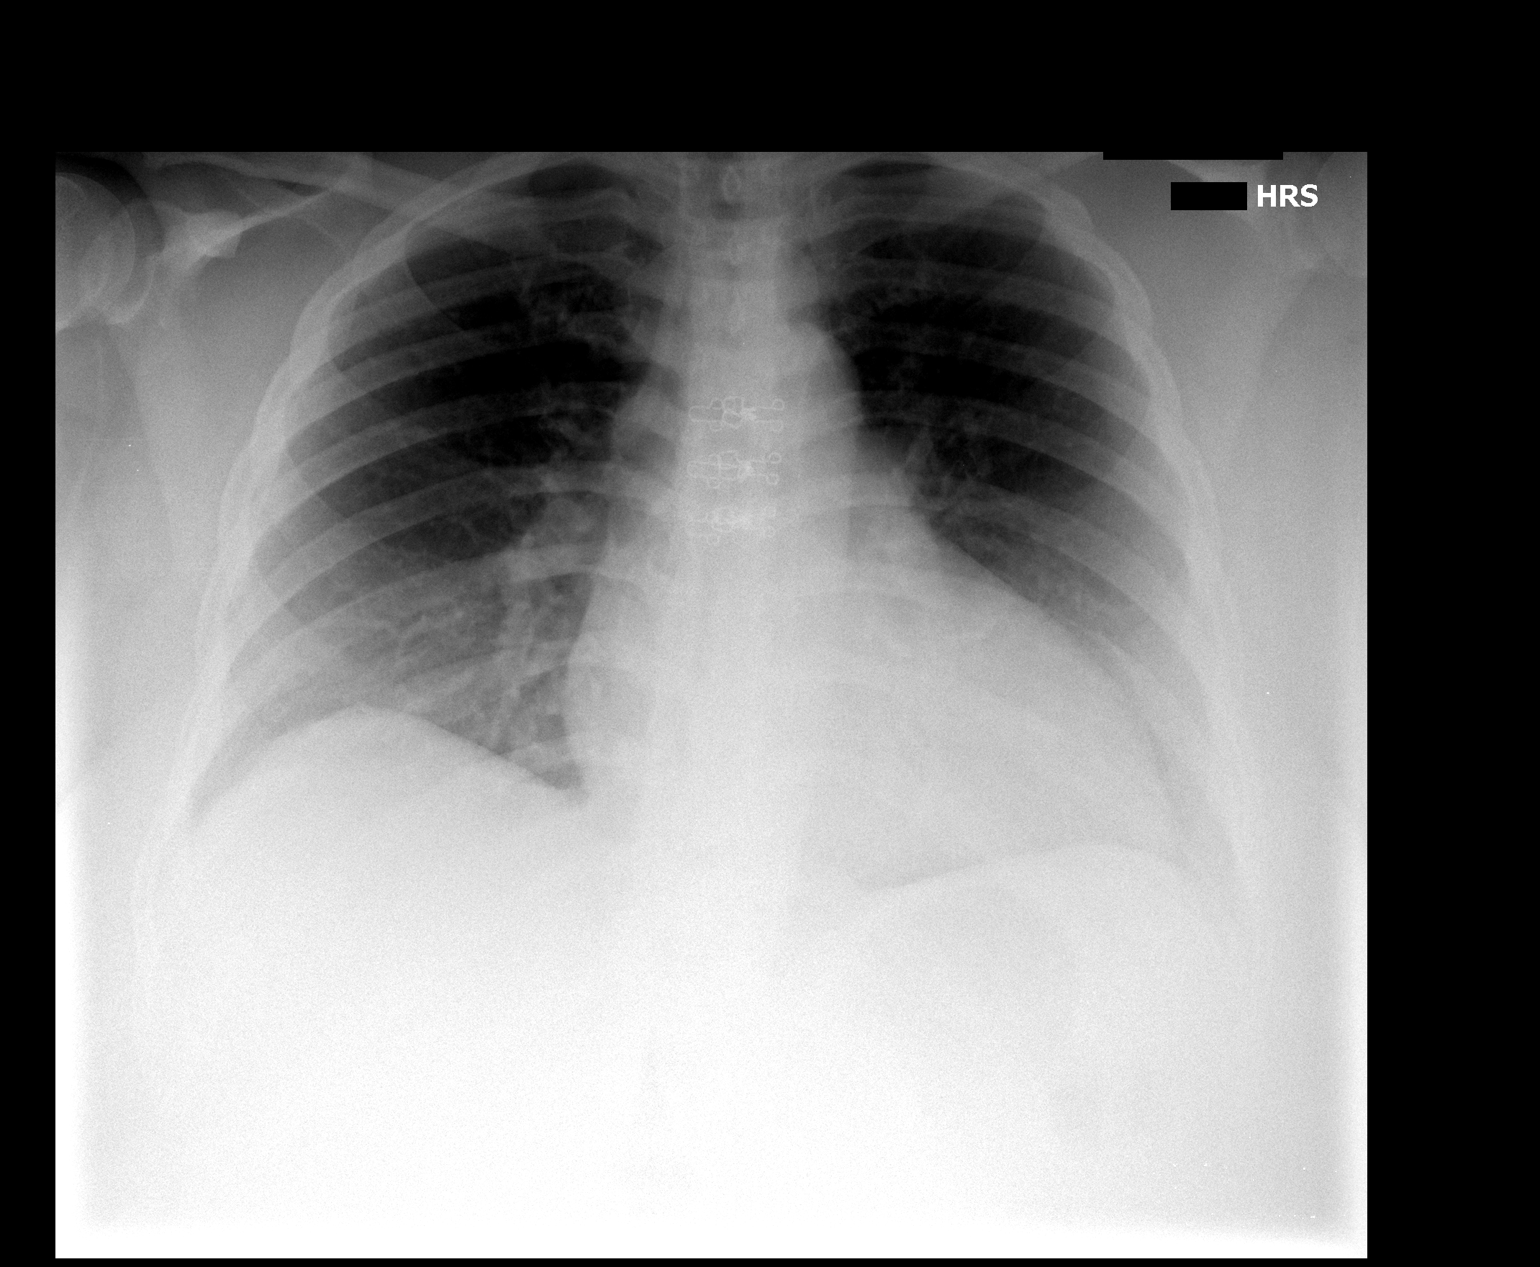

[1 of 1 positions shown; findings below may reference images not displayed]

FINDINGS: Lung volumes are low.  Mild basilar atelectasis.
Cardiopericardial silhouette and mediastinal contours normal.
Trachea midline.
IMPRESSION: Low volume chest.

## 2011-12-04 ENCOUNTER — Encounter: Payer: Self-pay | Admitting: Emergency Medicine

## 2011-12-04 ENCOUNTER — Ambulatory Visit (INDEPENDENT_AMBULATORY_CARE_PROVIDER_SITE_OTHER): Payer: Medicaid Other | Admitting: Emergency Medicine

## 2011-12-04 VITALS — BP 144/92 | HR 84 | Temp 98.8°F | Ht 61.0 in | Wt 246.4 lb

## 2011-12-04 DIAGNOSIS — G4733 Obstructive sleep apnea (adult) (pediatric): Secondary | ICD-10-CM

## 2011-12-04 DIAGNOSIS — J45909 Unspecified asthma, uncomplicated: Secondary | ICD-10-CM

## 2011-12-04 NOTE — Progress Notes (Signed)
  Subjective:    Patient ID: [name], female    DOB: [birth year], 42 y.o.   MRN: [id number] HPI 41 yo smoker, hx Asthma dx in her 20's, and UA irritation. Hospitalized 8/12-8/17 with an exacerbation in the setting possible trachiitis, viral URI.  She tells me that she has in the past had many hospitalizations for flares. Has had PFT's in the past but not recently. She has been managed with Advair in the past, many other BD's. Sent out of the hospital on Symbicort bid, prn albuterol. She is on zyrtec, still has breakthrough allergy symptoms. She is scheduled for tonsillectomy and adenoidectomy in Sept 14.   ROV 09/11/11 -- follow up hx allergies, UA irritation, ? Asthma. Has been rx as asthma since in her 41's.  Tells me that she has had recurrent episode difficulty w her breathing, went to ED and rx for asthma exac - she had SOB, wheeze, severe cough. Received nebs with little response, pred, abx. Still on the prednisone. She had tonsilectomy and adenoidectomy as planned.   ROV 12/04/11 -- follow up hx allergies, UA irritation, ? Asthma. She is on Symbicort, has a SABA that she uses. We stopped Symbicort last time.  Fluticasone nasal spray, zyrtec, singulair. Needs to start NSW. She is having OSA sx, has had a PSG before about a yr ago - prompted her tonsilectomy and adenoidectomy. She has gained wt since that test.    PULMONARY FUNCTON TEST 09/11/2011  FVC 2.58  FEV1 2.14  FEV1/FVC 82.9  FVC  % Predicted 81  FEV % Predicted 87  FeF 25-75 2.59  FeF 25-75 % Predicted 2.98        Objective:   Physical Exam  Gen: Pleasant, obese, in no distress,  Slightly anxious  ENT: No lesions,  mouth clear,  Very narrow post pharynx  Neck: No JVD, no TMG, no carotid bruits  Lungs: No use of accessory muscles, no dullness to percussion, clear without rales or rhonchi  Cardiovascular: RRR, heart sounds normal, no murmur or gallops, no peripheral edema  Musculoskeletal: No deformities, no  cyanosis or clubbing  Neuro: alert, non focal  Skin: Warm, no lesions or rashes     Assessment & Plan:  Asthma with allergic rhinitis Her spiro doesn't support asthma. I think she will do best off Symbicort - stop symbicort - SABA prn - aggressive allergy regimen - need a PSG  Sleep apnea, obstructive Sx sound like OSA, will check PSG

## 2011-12-04 NOTE — Assessment & Plan Note (Signed)
Her spiro doesn't support asthma. I think she will do best off Symbicort - stop symbicort - SABA prn - aggressive allergy regimen - need a PSG

## 2011-12-04 NOTE — Assessment & Plan Note (Signed)
Sx sound like OSA, will check PSG

## 2011-12-04 NOTE — Patient Instructions (Signed)
Please stop Symbicort Keep your rescue inhaler available to use as needed Continue your allergy medications as you are taking them Start using nasal saline washes every day during allergy season We will schedule a sleep study Follow with Dr Delton Coombes in 6 weeks

## 2012-01-05 ENCOUNTER — Ambulatory Visit (HOSPITAL_BASED_OUTPATIENT_CLINIC_OR_DEPARTMENT_OTHER): Payer: Medicaid Other

## 2012-01-15 ENCOUNTER — Ambulatory Visit: Payer: Medicaid Other | Admitting: Emergency Medicine

## 2012-01-15 ENCOUNTER — Ambulatory Visit (HOSPITAL_BASED_OUTPATIENT_CLINIC_OR_DEPARTMENT_OTHER): Payer: Medicaid Other | Attending: Emergency Medicine

## 2012-01-15 VITALS — Ht 61.0 in | Wt 240.0 lb

## 2012-01-15 DIAGNOSIS — G4733 Obstructive sleep apnea (adult) (pediatric): Secondary | ICD-10-CM | POA: Insufficient documentation

## 2012-01-29 DIAGNOSIS — G4733 Obstructive sleep apnea (adult) (pediatric): Secondary | ICD-10-CM

## 2012-01-30 NOTE — Procedures (Signed)
[name], [name] NO.:  [id number]  MEDICAL RECORD NO.:  [id number]          PATIENT TYPE:  OUT  LOCATION:  SLEEP CENTER                 FACILITY[facility]  PHYSICIAN:  Barbaraann Share, MD,FCCPDATE OF BIRTH:  1969/09/04  DATE OF STUDY:  01/15/2012                           NOCTURNAL POLYSOMNOGRAM  REFERRING PHYSICIAN:  Leslye Peer, MD  REFERRING PHYSICIAN:  Leslye Peer, MD.  INDICATION FOR STUDY:  Hypersomnia with sleep apnea.  EPWORTH SLEEPINESS SCORE:  Ten.  MEDICATIONS:  SLEEP ARCHITECTURE:  The patient had a total sleep time of 349 minutes with no slow-wave sleep and only 68 minutes of REM.  Sleep onset latency was normal at 1 minute and REM onset was normal at 80 minutes.  Sleep efficiency was normal at 93%.  RESPIRATORY DATA:  The patient was found to have 1 apnea and 36 obstructive hypopneas, giving her an apnea/hypopnea index of 6 events per hour.  The events occurred in all body positions and there was very loud snoring noted throughout.  The patient's events were very prominent during REM, and her REM AHI was 22 events per hour.  The patient did not meet split night protocol secondary to her small numbers of events, most of these occurring after 1 a.m.  OXYGEN DATA:  There was O2 desaturation as low as 85% with the patient's obstructive events.  CARDIAC DATA:  Rare PVC noted, but no clinically significant arrhythmias were seen.  MOVEMENT-PARASOMNIA:  The patient had no significant leg jerks or other abnormal behaviors noted.  IMPRESSIONS-RECOMMENDATIONS: 1. Very mild obstructive sleep apnea/hypopnea syndrome with an     apnea/hypopnea index of 6 events per hour and oxygen desaturation     as low as 85%.  Treatment for this degree of sleep apnea can     include a trial of weight loss alone, upper airway surgery, dental     appliance, and also CPAP.  The decision to treat this mild degree     of     sleep apnea should be based  upon its impact to the patient's     quality of life.  Clinical correlation is suggested. 2. Rare premature ventricular contraction noted, but no clinically     significant arrhythmias were seen.     Barbaraann Share, MD,FCCP Diplomate, American Board of Sleep Medicine    [facility]  D:  01/29/2012 08:57:55  T:  01/30/2012 01:24:11  Job:  [id number]

## 2012-02-03 ENCOUNTER — Ambulatory Visit: Payer: Medicaid Other | Admitting: Emergency Medicine

## 2012-02-03 ENCOUNTER — Telehealth: Payer: Self-pay | Admitting: *Deleted

## 2012-02-03 DIAGNOSIS — G4730 Sleep apnea, unspecified: Secondary | ICD-10-CM

## 2012-02-03 NOTE — Telephone Encounter (Addendum)
Called, spoke with pt who reports she "forgot completely about the appt today."  Pt states she's had a lot going on and is trying to plan her wedding in August.  She apologized for this. Pt was ok with rescheduling this appt but Dr. Delton Coombes does not have an opening until July.  Dr. Delton Coombes, pls advise if you would like to discuss these results over the phone with pt or double book her.

## 2012-02-03 NOTE — Telephone Encounter (Signed)
Pt returned our call.  Call her back at [phone] [name]

## 2012-02-04 NOTE — Telephone Encounter (Signed)
CPAP Titration Study has been scheduled for Wed. 02/25/12 at 8:00 pm. Contacted patient and she is aware of this study. Sleep packet mailed to patient. Patient will need F/U appointment scheduled per Dr. Delton Coombes. Please contact patient to schedule return office visit.

## 2012-02-04 NOTE — Telephone Encounter (Signed)
Discussed PSG results w her. Her AHI was 6/hr, but this went up to 22/hr when she was in REM sleep. She is symptomatic and I believe we need to treat. She is willing to go for CPAP titration. I will order the CPAP depending on results, then want her to f/u w me in 2-3 months to assess how she is doing

## 2012-02-05 NOTE — Telephone Encounter (Signed)
Spoke to pt and transferred to schedulers to mak appt 2wks after cpap titration study Tobe Sos

## 2012-02-09 ENCOUNTER — Ambulatory Visit: Payer: Medicaid Other | Admitting: Adult Health

## 2012-02-10 NOTE — Telephone Encounter (Signed)
Spoke with Dr. Delton Coombes regarding this as his msg below states pt will need to f/u in 2-3 months.  Per RB, pt does not need to come in 2 wk after cpap titration study.  She already has an OV with him in July -- he is fine with her keeping this appt.   Called, spoke with pt.  I apologized for any confusion.  Advised she does not need to come in on June 21 for OV with Dr Delton Coombes.  Advised she also has a pending OV with him on April 06, 2012 at 1:30 pm which she should keep but call back sooner if anything is needed.  She verbalized understanding of this.  Nothing further needed at this time.

## 2012-02-24 ENCOUNTER — Encounter (HOSPITAL_COMMUNITY): Payer: Self-pay | Admitting: *Deleted

## 2012-02-24 ENCOUNTER — Emergency Department (HOSPITAL_COMMUNITY): Payer: Medicaid Other

## 2012-02-24 ENCOUNTER — Other Ambulatory Visit: Payer: Self-pay

## 2012-02-24 ENCOUNTER — Emergency Department (HOSPITAL_COMMUNITY)
Admission: EM | Admit: 2012-02-24 | Discharge: 2012-02-24 | Disposition: A | Discharge status: Home / Self Care | Payer: Medicaid Other | Attending: Emergency Medicine | Admitting: Emergency Medicine

## 2012-02-24 DIAGNOSIS — Z79899 Other long term (current) drug therapy: Secondary | ICD-10-CM | POA: Insufficient documentation

## 2012-02-24 DIAGNOSIS — I10 Essential (primary) hypertension: Secondary | ICD-10-CM | POA: Insufficient documentation

## 2012-02-24 DIAGNOSIS — J45909 Unspecified asthma, uncomplicated: Secondary | ICD-10-CM | POA: Insufficient documentation

## 2012-02-24 DIAGNOSIS — E119 Type 2 diabetes mellitus without complications: Secondary | ICD-10-CM | POA: Insufficient documentation

## 2012-02-24 DIAGNOSIS — R079 Chest pain, unspecified: Secondary | ICD-10-CM | POA: Insufficient documentation

## 2012-02-24 DIAGNOSIS — R110 Nausea: Secondary | ICD-10-CM | POA: Insufficient documentation

## 2012-02-24 LAB — CBC
MCH: 29 pg (ref 26.0–34.0)
MCHC: 33.8 g/dL (ref 30.0–36.0)
MCV: 85.7 fL (ref 78.0–100.0)
Platelets: 302 10*3/uL (ref 150–400)
RBC: 4.07 MIL/uL (ref 3.87–5.11)
RDW: 14.1 % (ref 11.5–15.5)

## 2012-02-24 LAB — DIFFERENTIAL
Basophils Absolute: 0 10*3/uL (ref 0.0–0.1)
Basophils Relative: 0 % (ref 0–1)
Eosinophils Absolute: 0.1 10*3/uL (ref 0.0–0.7)
Eosinophils Relative: 1 % (ref 0–5)
Neutrophils Relative %: 42 % — ABNORMAL LOW (ref 43–77)

## 2012-02-24 LAB — BASIC METABOLIC PANEL
Calcium: 9.3 mg/dL (ref 8.4–10.5)
GFR calc Af Amer: >90 mL/min (ref >90)
GFR calc non Af Amer: 81 mL/min — ABNORMAL LOW (ref >90)
Potassium: 3.6 mEq/L (ref 3.5–5.1)
Sodium: 138 mEq/L (ref 135–145)

## 2012-02-24 LAB — D-DIMER, QUANTITATIVE: D-Dimer, Quant: 0.26 ug/mL-FEU (ref 0.00–0.48)

## 2012-02-24 MED ORDER — ONDANSETRON HCL 4 MG/2ML IJ SOLN
4.0000 mg | Freq: Once | INTRAMUSCULAR | Status: AC
  Administered 2012-02-24: 4 mg via INTRAVENOUS
  Filled 2012-02-24: qty 2

## 2012-02-24 MED ORDER — MORPHINE SULFATE 4 MG/ML IJ SOLN
4.0000 mg | Freq: Once | INTRAMUSCULAR | Status: AC
  Administered 2012-02-24: 4 mg via INTRAVENOUS
  Filled 2012-02-24: qty 1

## 2012-02-24 MED ORDER — OXYCODONE-ACETAMINOPHEN 5-325 MG PO TABS: 2.0000 | ORAL_TABLET | ORAL | Status: AC | PRN

## 2012-02-24 MED ORDER — OXYCODONE-ACETAMINOPHEN 5-325 MG PO TABS
2.0000 | ORAL_TABLET | Freq: Once | ORAL | Status: AC
Start: 2012-02-24 — End: 2012-02-24
  Administered 2012-02-24: 2 via ORAL
  Filled 2012-02-24: qty 2

## 2012-02-24 NOTE — ED Provider Notes (Signed)
History     CSN: [id number]  Arrival date & time 02/24/12  4098   First MD Initiated Contact with Patient 02/24/12 1034      Chief Complaint  Patient presents with  . Chest Pain    (Consider location/radiation/quality/duration/timing/severity/associated sxs/prior treatment) HPI  40yoF history of diabetes, hypertension, former smoker presents with chest pain. She states she's had the chest pain intermittently for the past one week or so but he began to be constant last night around approximately 8 PM. She describes it as left-sided and sharp. Worse when she moves and worse when she. She states she can't lie down her left side secondary to pain. It is also worse when she moves her left upper extremity. She complains of shortness of breath only when the pain is severe and states she was started to hyperventilate and started having tingling around her mouth. She does complain of nausea without vomiting. She denies diaphoresis. She denies back pain, abdominal pain. No history of coronary artery disease in the past. She is a former smoker. She has a history of asthma but denies recent wheezing. Does not feel like her GERD. Denies h/o VTE in self or family. No recent hosp/surg/immob. No h/o cancer. Denies exogenous hormone use, no leg pain or swelling    ED Notes, ED Provider Notes from 02/24/12 0000 to 02/24/12 09:25:07       Melissa Loyal Gambler, RN 02/24/2012 09:24      Pt reports intermittent chest pain x 1 week, states last night started having severe left sided chest pain states pain has been constant. Reports sob, numbness around mouth, and nausea.      Past Medical History  Diagnosis Date  . DM (diabetes mellitus)   . Hypertension   . Hypokalemia   . Asthma     Past Surgical History  Procedure Date  . Cesarean section     x 3  . Appendectomy   . Vesicovaginal fistula closure w/ tah 2009  . Hernia repair     Family History  Problem Relation Age of Onset  . Heart attack  Mother   . Stroke Mother   . Diabetes Mother   . Hypertension Mother   . Arthritis Mother   . Stroke Father   . Hypertension Sister   . Diabetes Sister   . Seizures Brother   . Diabetes Brother     History  Substance Use Topics  . Smoking status: Former Smoker -- 0.3 packs/day for 22 years    Types: Cigarettes    Quit date: 05/01/2011  . Smokeless tobacco: Never Used  . Alcohol Use: Yes     rare    OB History    Grav Para Term Preterm Abortions TAB SAB Ect Mult Living                  Review of Systems  All other systems reviewed and are negative.   except as noted HPI    Allergies  Review of patient's allergies indicates no known allergies.  Home Medications   Current Outpatient Rx  Name Route Sig Dispense Refill  . ALBUTEROL SULFATE HFA 108 (90 BASE) MCG/ACT IN AERS Inhalation Inhale 2 puffs into the lungs every 4 (four) hours as needed. For shortness of breath    . ASPIRIN EC 325 MG PO TBEC Oral Take 325 mg by mouth daily.    Marland Kitchen BENAZEPRIL-HYDROCHLOROTHIAZIDE 20-25 MG PO TABS Oral Take 1 tablet by mouth daily.    . BUDESONIDE-FORMOTEROL  FUMARATE 160-4.5 MCG/ACT IN AERO Inhalation Inhale 2 puffs into the lungs 2 (two) times daily as needed.     Marland Kitchen CETIRIZINE HCL 10 MG PO TABS Oral Take 10 mg by mouth daily as needed. allergies    . FLUTICASONE PROPIONATE 50 MCG/ACT NA SUSP Nasal Place 2 sprays into the nose 2 (two) times daily as needed. For nasal congestion    . GABAPENTIN 300 MG PO CAPS Oral Take 300 mg by mouth 3 (three) times daily.    . IBUPROFEN 200 MG PO TABS Oral Take 400 mg by mouth every 6 (six) hours as needed. pain    . METFORMIN HCL 500 MG PO TABS Oral Take 500 mg by mouth 2 (two) times daily.     Marland Kitchen MONTELUKAST SODIUM 10 MG PO TABS Oral Take 10 mg by mouth at bedtime.    Marland Kitchen NORTRIPTYLINE HCL 10 MG PO CAPS Oral Take 30 mg by mouth at bedtime. Take 1 tab for 1 week, 2 tabs for 2 weeks, then 3 tabs per night and stay    . OMEPRAZOLE 20 MG PO CPDR Oral  Take 1 capsule (20 mg total) by mouth daily. 30 capsule 11  . SERTRALINE HCL 25 MG PO TABS Oral Take 25 mg by mouth daily.      Marland Kitchen SIMVASTATIN 10 MG PO TABS Oral Take 10 mg by mouth at bedtime.      . TOPIRAMATE 100 MG PO TABS Oral Take 100 mg by mouth at bedtime.      . OXYCODONE-ACETAMINOPHEN 5-325 MG PO TABS Oral Take 2 tablets by mouth every 4 (four) hours as needed for pain. 15 tablet 0    BP 134/89  Pulse 72  Temp(Src) 97.6 F (36.4 C) (Oral)  Resp 22  SpO2 97%  Physical Exam  Nursing note and vitals reviewed. Constitutional: She is oriented to person, place, and time. She appears well-developed.  HENT:  Head: Atraumatic.  Mouth/Throat: Oropharynx is clear and moist.  Eyes: Conjunctivae and EOM are normal. Pupils are equal, round, and reactive to light.  Neck: Normal range of motion. Neck supple.  Cardiovascular: Normal rate, regular rhythm, normal heart sounds and intact distal pulses.   Pulmonary/Chest: Effort normal and breath sounds normal. No respiratory distress. She has no wheezes. She has no rales. She exhibits tenderness.       Moderate ttp left CW-states that it reproduces her pain No breast mass No overlying erythema  Abdominal: Soft. She exhibits no distension. There is no tenderness. There is no rebound and no guarding.  Musculoskeletal: Normal range of motion. She exhibits no edema and no tenderness.  Neurological: She is alert and oriented to person, place, and time.  Skin: Skin is warm and dry. No rash noted.  Psychiatric: She has a normal mood and affect.    Date: 02/26/2012  Rate: 77  Rhythm: normal sinus rhythm  QRS Axis: normal  Intervals: normal  ST/T Wave abnormalities: normal  Conduction Disutrbances:none  Narrative Interpretation:   Old EKG Reviewed: no significant change from previous   ED Course  Procedures (including critical care time)  Labs Reviewed  CBC - Abnormal; Notable for the following:    Hemoglobin 11.8 (*)    HCT 34.9 (*)      All other components within normal limits  DIFFERENTIAL - Abnormal; Notable for the following:    Neutrophils Relative 42 (*)    Lymphocytes Relative 50 (*)    All other components within normal limits  BASIC METABOLIC  PANEL - Abnormal; Notable for the following:    Glucose, Bld 128 (*)    GFR calc non Af Amer 81 (*)    All other components within normal limits  TROPONIN I  D-DIMER, QUANTITATIVE  LAB REPORT - SCANNED   Dg Chest 2 View  02/24/2012  *RADIOLOGY REPORT*  Clinical Data: Chest pain, shortness of breath, history of asthma  CHEST - 2 VIEW  Comparison: 08/31/2011; 05/04/2011; 05/01/2011  Findings: Grossly unchanged and borderline enlarged cardiac silhouette.  Normal mediastinal contours.  No focal parenchymal opacities.  No pleural effusion or pneumothorax.  Unchanged bones.  IMPRESSION: No acute cardiopulmonary disease.  Original Report Authenticated By: Waynard Reeds, M.D.    1. Chest pain     MDM  40yoF presents with chest pain which is likely musculoskeletal in nature. It is very reproducible by exam. She is low risk for pulmonary embolism and her d-dimer is negative. Although she does have risk factors for ACS with her hypertension and diabetes her symptoms have been intermittent over the last week in present for greater than 12 hours and her troponin is negative. This makes myocarditis less likely as well. Chest x-ray is unremarkable. EKG nondiagnostic, nonischemic. The patient has been given pain medication is feeling slightly better and will be discharged home with same. She's been given precautions for return.        Forbes Cellar, MD 02/26/12 [phone]

## 2012-02-24 NOTE — ED Notes (Signed)
Pt reports intermittent chest pain x 1 week, states last night started having severe left sided chest pain states pain has been constant. Reports sob, numbness around mouth, and nausea.

## 2012-02-24 NOTE — ED Notes (Addendum)
Pt reports CP x1 that became constant last night around 2000. States pain is midsternal that wraps around left side into her back. Pt lying on stretcher tearful, pt appears restless with anxiety. Pt reports she took her BP meds this AM but has not taken her Prilosec in about a week.

## 2012-02-24 NOTE — ED Notes (Signed)
NAD noted at time of d/c home with family. Pt verbalized understanding of d/c inst.  

## 2012-02-24 NOTE — Discharge Instructions (Signed)
Chest Pain (Nonspecific) It is often hard to give a specific diagnosis for the cause of chest pain. There is always a chance that your pain could be related to something serious, such as a heart attack or a blood clot in the lungs. You need to follow up with your caregiver for further evaluation. CAUSES   Heartburn.   Pneumonia or bronchitis.   Anxiety or stress.   Inflammation around your heart (pericarditis) or lung (pleuritis or pleurisy).   A blood clot in the lung.   A collapsed lung (pneumothorax). It can develop suddenly on its own (spontaneous pneumothorax) or from injury (trauma) to the chest.   Shingles infection (herpes zoster virus).  The chest wall is composed of bones, muscles, and cartilage. Any of these can be the source of the pain.  The bones can be bruised by injury.   The muscles or cartilage can be strained by coughing or overwork.   The cartilage can be affected by inflammation and become sore (costochondritis).  DIAGNOSIS  Lab tests or other studies, such as X-rays, electrocardiography, stress testing, or cardiac imaging, may be needed to find the cause of your pain.  TREATMENT   Treatment depends on what may be causing your chest pain. Treatment may include:   Acid blockers for heartburn.   Anti-inflammatory medicine.   Pain medicine for inflammatory conditions.   Antibiotics if an infection is present.   You may be advised to change lifestyle habits. This includes stopping smoking and avoiding alcohol, caffeine, and chocolate.   You may be advised to keep your head raised (elevated) when sleeping. This reduces the chance of acid going backward from your stomach into your esophagus.   Most of the time, nonspecific chest pain will improve within 2 to 3 days with rest and mild pain medicine.  HOME CARE INSTRUCTIONS   If antibiotics were prescribed, take your antibiotics as directed. Finish them even if you start to feel better.   For the next few  days, avoid physical activities that bring on chest pain. Continue physical activities as directed.   Do not smoke.   Avoid drinking alcohol.   Only take over-the-counter or prescription medicine for pain, discomfort, or fever as directed by your caregiver.   Follow your caregiver's suggestions for further testing if your chest pain does not go away.   Keep any follow-up appointments you made. If you do not go to an appointment, you could develop lasting (chronic) problems with pain. If there is any problem keeping an appointment, you must call to reschedule.  SEEK MEDICAL CARE IF:   You think you are having problems from the medicine you are taking. Read your medicine instructions carefully.   Your chest pain does not go away, even after treatment.   You develop a rash with blisters on your chest.  SEEK IMMEDIATE MEDICAL CARE IF:   You have increased chest pain or pain that spreads to your arm, neck, jaw, back, or abdomen.   You develop shortness of breath, an increasing cough, or you are coughing up blood.   You have severe back or abdominal pain, feel nauseous, or vomit.   You develop severe weakness, fainting, or chills.   You have a fever.  THIS IS AN EMERGENCY. Do not wait to see if the pain will go away. Get medical help at once. Call your local emergency services (911 in [place] Do not drive yourself to the hospital. MAKE SURE YOU:   Understand these instructions.     Will watch your condition.   Will get help right away if you are not doing well or get worse.  Document Released: 06/18/2005 Document Revised: 08/28/2011 Document Reviewed: 04/13/2008 ExitCare Patient Information 2012 [employer]  RESOURCE GUIDE  Dental Problems  Patients with Medicaid: [facility]                     [facility] [address]                                           [address] Phone:  [phone]                                                    Phone:  [phone]  If unable to pay or uninsured, contact:  [facility] or [facility] to become qualified for the adult dental clinic.  Chronic Pain Problems Contact [facility]  [phone] Patients need to be referred by their primary care doctor.  Insufficient Money for Medicine Contact [employer]:  call "211" or [facility] 271-5999.  No Primary Care Doctor Call [facility]  832-8000 Other agencies that provide inexpensive medical care    [facility]  [phone]    [facility]  [phone]    [facility]  [phone]    [facility]  [phone]    [facility]  [phone]    [facility]  [phone]  Psychological Services [facility]  [phone] [facility]  [phone] [facility]   800 853-5163 (emergency services 641-4993)  Abuse/Neglect [place] Child Abuse Hotline (336) 641-3795 [place] Child Abuse Hotline 800-378-5315 (After Hours)  Emergency Shelter [facility] (336) 271-5985  [facility] (336) 275-9566 [facility] (704) 372-4663  MRSA Hotline #:   832-7006    [place] [facility]  [facility] [place]  [employer]                           [facility] [address] [employer]                     [address]         [address]  [place]                                               [place]                              [place] Phone:  [phone]                                  Phone:  [phone]                   Phone:    [phone]  [facility] Phone:  [phone]  [place] Child Abuse Hotline (336) 342-1394 (336) 342-3537 (After Hours)  

## 2012-02-25 ENCOUNTER — Ambulatory Visit (HOSPITAL_BASED_OUTPATIENT_CLINIC_OR_DEPARTMENT_OTHER): Payer: Medicaid Other

## 2012-03-12 ENCOUNTER — Ambulatory Visit: Payer: Medicaid Other | Admitting: Emergency Medicine

## 2012-03-16 ENCOUNTER — Ambulatory Visit (HOSPITAL_BASED_OUTPATIENT_CLINIC_OR_DEPARTMENT_OTHER): Payer: Medicaid Other | Attending: Emergency Medicine

## 2012-03-16 VITALS — Ht 61.0 in | Wt 240.0 lb

## 2012-03-16 DIAGNOSIS — G4730 Sleep apnea, unspecified: Secondary | ICD-10-CM

## 2012-03-16 DIAGNOSIS — G4733 Obstructive sleep apnea (adult) (pediatric): Secondary | ICD-10-CM

## 2012-03-16 DIAGNOSIS — I10 Essential (primary) hypertension: Secondary | ICD-10-CM | POA: Insufficient documentation

## 2012-03-16 DIAGNOSIS — E119 Type 2 diabetes mellitus without complications: Secondary | ICD-10-CM | POA: Insufficient documentation

## 2012-03-21 DIAGNOSIS — G4733 Obstructive sleep apnea (adult) (pediatric): Secondary | ICD-10-CM

## 2012-03-21 DIAGNOSIS — I10 Essential (primary) hypertension: Secondary | ICD-10-CM

## 2012-03-21 DIAGNOSIS — E119 Type 2 diabetes mellitus without complications: Secondary | ICD-10-CM

## 2012-03-21 NOTE — Procedures (Signed)
NAME:  [name], [name] NO.:  [id number]  MEDICAL RECORD NO.:  [id number]          PATIENT TYPE:  OUT  LOCATION:  SLEEP CENTER                 FACILITY:  [facility]  PHYSICIAN:  Coralyn Helling, MD        DATE OF BIRTH:  1971/03/13  DATE OF STUDY:  03/16/2012                           NOCTURNAL POLYSOMNOGRAM  REFERRING PHYSICIAN:  Leslye Peer, MD  FACILITY:  [facility].  REFERRING PHYSICIAN:  Leslye Peer, MD  INDICATION:  [name] is a 41 year old female, who has a history of hypertension and diabetes.  She is also found to have mild obstructive sleep apnea.  After having a sleep study on January 15, 2012, with an apnea-hypopnea index of 6 and oxygen saturation nadir of 85%. She is referred to sleep lab for a CPAP titration study.  Height is 5 feet 1 inch, weight is 240 pounds BMI is 45, neck size is 16 inches.  MEDICATIONS:  Beclomethasone, cetirizine, fluticasone, gabapentin, hydrochlorothiazide, hydrocodone, ibuprofen, metformin, montelukast, nortriptyline, omeprazole, simvastatin, and topiramate.  EPWORTH SLEEPINESS SCORE:  9.  SLEEP ARCHITECTURE:  Total recording time was 414 minutes, total sleep time was 315 minutes, sleep efficiency was 76%, sleep latency was 25 minutes, REM latency was 143 minutes.  The study was notable for lack of slow-wave sleep, and reduction in the percentage of REM sleep.  She slept predominately in the nonsupine position.  RESPIRATORY DATA:  The average respiratory rate was 20.  The patient was started on CPAP of 5 and increased to 10 cm of water.  With CPAP set at 10 cm of water, she appeared to have optimal control of her obstructive sleep apnea.  She did have transient episodes of central respiratory events with initial change to CPAP of 10, but then this subsequently resolved.  Snoring was eliminated with CPAP set at 10 cm of water, and she was observed in REM sleep.  OXYGEN DATA:  The baseline oxygenation  was 98%.  The oxygen saturation nadir was 91%.  The study was conducted without the use of supplemental oxygen.  CARDIAC DATA:  The average heart rate is 83 and the rhythm strip showed normal sinus rhythm.  MOVEMENT PARASOMNIA:  The periodic limb movement index was 0 and the patient had 1 restroom trip.  IMPRESSION:  This was a successful CPAP titration study.  She did well with CPAP at 10 cm of water.  She was fitted with a Respironics Amara full-face mask, small size.  She used C-Flex +3.  In addition to diet, exercise, and weight reduction, I would recommend the patient be started on CPAP at 10 cm of water and monitored for her clinical response.     Coralyn Helling, MD Diplomat, American Board of Sleep Medicine    VS/MEDQ  D:  03/21/2012 11:17:59  T:  03/21/2012 19:36:17  Job:  [id number]

## 2012-03-25 ENCOUNTER — Emergency Department (HOSPITAL_COMMUNITY)
Admission: EM | Admit: 2012-03-25 | Discharge: 2012-03-25 | Disposition: A | Discharge status: Home / Self Care | Payer: Medicaid Other | Attending: Emergency Medicine | Admitting: Emergency Medicine

## 2012-03-25 ENCOUNTER — Encounter (HOSPITAL_COMMUNITY): Payer: Self-pay | Admitting: Emergency Medicine

## 2012-03-25 DIAGNOSIS — I10 Essential (primary) hypertension: Secondary | ICD-10-CM | POA: Insufficient documentation

## 2012-03-25 DIAGNOSIS — M5380 Other specified dorsopathies, site unspecified: Secondary | ICD-10-CM | POA: Insufficient documentation

## 2012-03-25 DIAGNOSIS — E119 Type 2 diabetes mellitus without complications: Secondary | ICD-10-CM | POA: Insufficient documentation

## 2012-03-25 DIAGNOSIS — J45909 Unspecified asthma, uncomplicated: Secondary | ICD-10-CM | POA: Insufficient documentation

## 2012-03-25 DIAGNOSIS — M62830 Muscle spasm of back: Secondary | ICD-10-CM

## 2012-03-25 DIAGNOSIS — Z87891 Personal history of nicotine dependence: Secondary | ICD-10-CM | POA: Insufficient documentation

## 2012-03-25 LAB — URINALYSIS, ROUTINE W REFLEX MICROSCOPIC
Bilirubin Urine: NEGATIVE
Urobilinogen, UA: 0.2 mg/dL (ref 0.0–1.0)
pH: 5.5 (ref 5.0–8.0)

## 2012-03-25 LAB — URINE MICROSCOPIC-ADD ON

## 2012-03-25 LAB — PREGNANCY, URINE: Preg Test, Ur: NEGATIVE

## 2012-03-25 MED ORDER — OXYCODONE-ACETAMINOPHEN 5-325 MG PO TABS: 2.0000 | ORAL_TABLET | ORAL | Status: AC | PRN

## 2012-03-25 MED ORDER — HYDROMORPHONE HCL PF 2 MG/ML IJ SOLN
2.0000 mg | Freq: Once | INTRAMUSCULAR | Status: AC
  Administered 2012-03-25: 2 mg via INTRAMUSCULAR
  Filled 2012-03-25: qty 1

## 2012-03-25 MED ORDER — DIAZEPAM 5 MG PO TABS: 5.0000 mg | ORAL_TABLET | Freq: Two times a day (BID) | ORAL | Status: AC

## 2012-03-25 MED ORDER — KETOROLAC TROMETHAMINE 60 MG/2ML IM SOLN
60.0000 mg | Freq: Once | INTRAMUSCULAR | Status: AC
  Administered 2012-03-25: 60 mg via INTRAMUSCULAR
  Filled 2012-03-25: qty 2

## 2012-03-25 MED ORDER — DIAZEPAM 5 MG PO TABS
5.0000 mg | ORAL_TABLET | Freq: Once | ORAL | Status: AC
  Administered 2012-03-25: 5 mg via ORAL
  Filled 2012-03-25: qty 1

## 2012-03-25 MED ORDER — IBUPROFEN 800 MG PO TABS: 800.0000 mg | ORAL_TABLET | Freq: Three times a day (TID) | ORAL | Status: AC

## 2012-03-25 NOTE — ED Notes (Signed)
Patient ambulated in the hallway.  Patient tolerated well.

## 2012-03-25 NOTE — ED Notes (Signed)
Patient states that she started having back pain on Sunday.  Patient states that she has bouts of back pain with spasms periodically.   She claims that it has been going on for a few years.   Patient advises that she has been taking her hydrocodone that was prescribed for her headaches for her back pain and that it does not touch the pain or spasms.   Patient is tearful and claims "i have to get better before my wedding in 44 days".

## 2012-03-25 NOTE — ED Provider Notes (Signed)
History   This chart was scribed for Glynn Octave, MD by Toya Smothers. The patient was seen in room TR05C/TR05C. Patient's care was started at 1131.  CSN: [id number]  Arrival date & time 03/25/12  1131   First MD Initiated Contact with Patient 03/25/12 1142      Chief Complaint  Patient presents with  . Back Pain   The history is provided by the patient. No language interpreter was used.   [name] is a 41 y.o. female who presents to the Emergency Department complaining of sudden onset 4 days ago moderate severe constant lower back pain. Pt reports that she has had chronic intermittent lower back pain for several years. 4 days ago Pt woke up at night with localized left lower back pain radiating downward to left thigh. Associate symptoms include emesis and dysuria. Pt has taken hydrocodone and ibuprofen with mild relief, and reports that holding the area provides mild relief. Pt lists a h/o HTN.  Pt lists PCP Lieutenant Diego  Past Medical History  Diagnosis Date  . DM (diabetes mellitus)   . Hypertension   . Hypokalemia   . Asthma     Past Surgical History  Procedure Date  . Cesarean section     x 3  . Appendectomy   . Vesicovaginal fistula closure w/ tah 2009  . Hernia repair     Family History  Problem Relation Age of Onset  . Heart attack Mother   . Stroke Mother   . Diabetes Mother   . Hypertension Mother   . Arthritis Mother   . Stroke Father   . Hypertension Sister   . Diabetes Sister   . Seizures Brother   . Diabetes Brother     History  Substance Use Topics  . Smoking status: Former Smoker -- 0.3 packs/day for 22 years    Types: Cigarettes    Quit date: 05/01/2011  . Smokeless tobacco: Never Used  . Alcohol Use: Yes     rare   Review of Systems  Constitutional: Negative for fever.  HENT: Negative for rhinorrhea.   Eyes: Negative for pain.  Respiratory: Negative for cough and shortness of breath.   Cardiovascular: Negative for chest  pain.  Gastrointestinal: Positive for vomiting. Negative for nausea, abdominal pain and diarrhea.  Genitourinary: Positive for dysuria. Negative for hematuria.  Musculoskeletal: Positive for back pain.  Skin: Negative for rash.  Neurological: Negative for weakness and headaches.    Allergies  Review of patient's allergies indicates no known allergies.  Home Medications   Current Outpatient Rx  Name Route Sig Dispense Refill  . ALBUTEROL SULFATE HFA 108 (90 BASE) MCG/ACT IN AERS Inhalation Inhale 2 puffs into the lungs every 4 (four) hours as needed. For shortness of breath    . ASPIRIN EC 325 MG PO TBEC Oral Take 325 mg by mouth daily.    Marland Kitchen BENAZEPRIL-HYDROCHLOROTHIAZIDE 20-25 MG PO TABS Oral Take 1 tablet by mouth daily.    . BUDESONIDE-FORMOTEROL FUMARATE 160-4.5 MCG/ACT IN AERO Inhalation Inhale 2 puffs into the lungs 2 (two) times daily as needed.     Marland Kitchen CETIRIZINE HCL 10 MG PO TABS Oral Take 10 mg by mouth daily as needed. allergies    . DIAZEPAM 5 MG PO TABS Oral Take 1 tablet (5 mg total) by mouth 2 (two) times daily. 6 tablet 0  . FLUTICASONE PROPIONATE 50 MCG/ACT NA SUSP Nasal Place 2 sprays into the nose 2 (two) times daily as needed. For nasal  congestion    . GABAPENTIN 300 MG PO CAPS Oral Take 300 mg by mouth 3 (three) times daily.    . IBUPROFEN 200 MG PO TABS Oral Take 400 mg by mouth every 6 (six) hours as needed. pain    . IBUPROFEN 800 MG PO TABS Oral Take 1 tablet (800 mg total) by mouth 3 (three) times daily. 21 tablet 0  . METFORMIN HCL 500 MG PO TABS Oral Take 500 mg by mouth 2 (two) times daily.     Marland Kitchen MONTELUKAST SODIUM 10 MG PO TABS Oral Take 10 mg by mouth at bedtime.    Marland Kitchen NORTRIPTYLINE HCL 10 MG PO CAPS Oral Take 30 mg by mouth at bedtime. Take 1 tab for 1 week, 2 tabs for 2 weeks, then 3 tabs per night and stay    . OMEPRAZOLE 20 MG PO CPDR Oral Take 1 capsule (20 mg total) by mouth daily. 30 capsule 11  . OXYCODONE-ACETAMINOPHEN 5-325 MG PO TABS Oral Take 2  tablets by mouth every 4 (four) hours as needed for pain. 15 tablet 0  . SERTRALINE HCL 25 MG PO TABS Oral Take 25 mg by mouth daily.      Marland Kitchen SIMVASTATIN 10 MG PO TABS Oral Take 10 mg by mouth at bedtime.      . TOPIRAMATE 100 MG PO TABS Oral Take 100 mg by mouth at bedtime.        BP 135/98  Pulse 78  Temp 98.3 F (36.8 C) (Oral)  Resp 20  SpO2 99%  Physical Exam  Nursing note and vitals reviewed. Constitutional: She appears well-developed and well-nourished.       Awake, alert, nontoxic appearance with baseline speech.  HENT:  Head: Atraumatic.  Eyes: Pupils are equal, round, and reactive to light. Right eye exhibits no discharge. Left eye exhibits no discharge.  Neck: Neck supple. No tracheal deviation present.  Cardiovascular: Normal rate and regular rhythm.   No murmur heard. Pulmonary/Chest: Effort normal and breath sounds normal. No respiratory distress. She has no wheezes. She has no rales. She exhibits no tenderness.  Abdominal: Soft. Bowel sounds are normal. She exhibits no mass. There is no tenderness. There is no rebound.  Musculoskeletal:       Thoracic back: She exhibits no tenderness.       Lumbar back: She exhibits no tenderness.       L perispinal lumbar thoracic w/ spasm. No midline pain. +2 Dp Dt pulses. Bilateral 5/5 strength in lower extremities. Great toe dorsal flexion intact. +2 patella reflexes.  Neurological:       Mental status baseline for patient.  Upper extremity motor strength and sensation intact and symmetric bilaterally.  Skin: No rash noted.  Psychiatric: She has a normal mood and affect.    ED Course  Procedures (including critical care time) DIAGNOSTIC STUDIES: Oxygen Saturation is 99% on room air, normal by my interpretation.    COORDINATION OF CARE: 1159-Evaluated Pt's present illness. Ordered Valium,Dilaudid, and Toradol. 1228-Rechecked Pt. Pt is feeling better.  Labs Reviewed  URINALYSIS, ROUTINE W REFLEX MICROSCOPIC - Abnormal;  Notable for the following:    APPearance CLOUDY (*)     Leukocytes, UA SMALL (*)     All other components within normal limits  URINE MICROSCOPIC-ADD ON - Abnormal; Notable for the following:    Squamous Epithelial / LPF MANY (*)     Bacteria, UA MANY (*)     All other components within normal limits  PREGNANCY, URINE  No results found.   1. Back spasm       MDM  Acute on chronic left-sided low back pain with spasm. No focal weakness, numbness, tingling, bowel or bladder incontinence, fever chills. No midline pain.  No neuro deficits on exam, intact distal pulses.   Pain improved with medications. Patient ambulatory in the hallways.   I personally performed the services described in this documentation, which was scribed in my presence.  The recorded information has been reviewed and considered.    Glynn Octave, MD 03/25/12 [phone]

## 2012-03-29 ENCOUNTER — Other Ambulatory Visit: Payer: Self-pay | Admitting: Emergency Medicine

## 2012-03-29 DIAGNOSIS — G4733 Obstructive sleep apnea (adult) (pediatric): Secondary | ICD-10-CM

## 2012-03-29 NOTE — Progress Notes (Signed)
Rob, Reviewed CPAP titration from 03/16/12. She did well with CPAP 10 cm H2O. She was fitted with respironics small size amara full face mask. Thanks. Vineet

## 2012-04-06 ENCOUNTER — Ambulatory Visit: Payer: Medicaid Other | Admitting: Emergency Medicine

## 2012-05-04 ENCOUNTER — Ambulatory Visit: Payer: Medicaid Other | Admitting: Emergency Medicine

## 2012-05-28 ENCOUNTER — Emergency Department (HOSPITAL_COMMUNITY)
Admission: EM | Admit: 2012-05-28 | Discharge: 2012-05-28 | Discharge status: Against Medical Advice | Payer: Medicaid Other | Attending: Emergency Medicine | Admitting: Emergency Medicine

## 2012-05-28 ENCOUNTER — Encounter (HOSPITAL_COMMUNITY): Payer: Self-pay | Admitting: Family Medicine

## 2012-05-28 DIAGNOSIS — R7309 Other abnormal glucose: Secondary | ICD-10-CM | POA: Insufficient documentation

## 2012-05-28 DIAGNOSIS — R51 Headache: Secondary | ICD-10-CM | POA: Insufficient documentation

## 2012-05-28 NOTE — ED Notes (Signed)
Pt sts her blood sugar has been elevated and unable to control it since the 20th. sts also HA, blurry vision.

## 2012-05-28 NOTE — ED Notes (Signed)
Called Patient 3 times and NO ANSWER

## 2012-05-28 NOTE — ED Notes (Signed)
Pt updated on wait time.  

## 2012-06-01 ENCOUNTER — Ambulatory Visit: Payer: Medicaid Other | Admitting: Emergency Medicine

## 2012-06-01 ENCOUNTER — Encounter (HOSPITAL_COMMUNITY): Payer: Self-pay | Admitting: *Deleted

## 2012-06-01 ENCOUNTER — Emergency Department (HOSPITAL_COMMUNITY)
Admission: EM | Admit: 2012-06-01 | Discharge: 2012-06-01 | Disposition: A | Discharge status: Home / Self Care | Payer: Medicaid Other | Attending: Emergency Medicine | Admitting: Emergency Medicine

## 2012-06-01 DIAGNOSIS — Z79899 Other long term (current) drug therapy: Secondary | ICD-10-CM | POA: Insufficient documentation

## 2012-06-01 DIAGNOSIS — Z9089 Acquired absence of other organs: Secondary | ICD-10-CM | POA: Insufficient documentation

## 2012-06-01 DIAGNOSIS — J45909 Unspecified asthma, uncomplicated: Secondary | ICD-10-CM | POA: Insufficient documentation

## 2012-06-01 DIAGNOSIS — R51 Headache: Secondary | ICD-10-CM | POA: Insufficient documentation

## 2012-06-01 DIAGNOSIS — Z87891 Personal history of nicotine dependence: Secondary | ICD-10-CM | POA: Insufficient documentation

## 2012-06-01 DIAGNOSIS — I10 Essential (primary) hypertension: Secondary | ICD-10-CM | POA: Insufficient documentation

## 2012-06-01 DIAGNOSIS — E1169 Type 2 diabetes mellitus with other specified complication: Secondary | ICD-10-CM | POA: Insufficient documentation

## 2012-06-01 DIAGNOSIS — R739 Hyperglycemia, unspecified: Secondary | ICD-10-CM

## 2012-06-01 LAB — COMPREHENSIVE METABOLIC PANEL
BUN: 10 mg/dL (ref 6–23)
CO2: 23 mEq/L (ref 19–32)
Calcium: 9.4 mg/dL (ref 8.4–10.5)
Creatinine, Ser: 0.92 mg/dL (ref 0.50–1.10)
GFR calc Af Amer: 88 mL/min — ABNORMAL LOW (ref >90)
GFR calc non Af Amer: 76 mL/min — ABNORMAL LOW (ref >90)
Glucose, Bld: 270 mg/dL — ABNORMAL HIGH (ref 70–99)

## 2012-06-01 LAB — CBC WITH DIFFERENTIAL/PLATELET
Basophils Absolute: 0 10*3/uL (ref 0.0–0.1)
Eosinophils Relative: 1 % (ref 0–5)
HCT: 35.5 % — ABNORMAL LOW (ref 36.0–46.0)
Lymphocytes Relative: 52 % — ABNORMAL HIGH (ref 12–46)
MCV: 86.2 fL (ref 78.0–100.0)
Monocytes Absolute: 0.5 10*3/uL (ref 0.1–1.0)
RDW: 14.2 % (ref 11.5–15.5)
WBC: 7.6 10*3/uL (ref 4.0–10.5)

## 2012-06-01 LAB — URINALYSIS, ROUTINE W REFLEX MICROSCOPIC
Leukocytes, UA: NEGATIVE
Protein, ur: NEGATIVE mg/dL
Urobilinogen, UA: 0.2 mg/dL (ref 0.0–1.0)

## 2012-06-01 LAB — GLUCOSE, CAPILLARY
Glucose-Capillary: 269 mg/dL — ABNORMAL HIGH (ref 70–99)
Glucose-Capillary: 132 mg/dL — ABNORMAL HIGH (ref 70–99)

## 2012-06-01 MED ORDER — SODIUM CHLORIDE 0.9 % IV BOLUS (SEPSIS)
1000.0000 mL | Freq: Once | INTRAVENOUS | Status: AC
  Administered 2012-06-01: 1000 mL via INTRAVENOUS

## 2012-06-01 MED ORDER — MORPHINE SULFATE 4 MG/ML IJ SOLN
4.0000 mg | Freq: Once | INTRAMUSCULAR | Status: AC
  Administered 2012-06-01: 4 mg via INTRAVENOUS
  Filled 2012-06-01: qty 1

## 2012-06-01 MED ORDER — KETOROLAC TROMETHAMINE 30 MG/ML IJ SOLN
30.0000 mg | Freq: Once | INTRAMUSCULAR | Status: AC
  Administered 2012-06-01: 30 mg via INTRAVENOUS
  Filled 2012-06-01: qty 1

## 2012-06-01 MED ORDER — DIPHENHYDRAMINE HCL 50 MG/ML IJ SOLN
25.0000 mg | Freq: Once | INTRAMUSCULAR | Status: AC
  Administered 2012-06-01: 25 mg via INTRAVENOUS
  Filled 2012-06-01: qty 1

## 2012-06-01 MED ORDER — PROMETHAZINE HCL 25 MG/ML IJ SOLN
25.0000 mg | Freq: Once | INTRAMUSCULAR | Status: AC
  Administered 2012-06-01: 25 mg via INTRAVENOUS
  Filled 2012-06-01 (×2): qty 1

## 2012-06-01 NOTE — ED Provider Notes (Signed)
History     CSN: [id number]  Arrival date & time 06/01/12  1154   First MD Initiated Contact with Patient 06/01/12 1340      Chief Complaint  Patient presents with  . Hyperglycemia  . Polyuria  . Headache    (Consider location/radiation/quality/duration/timing/severity/associated sxs/prior treatment) HPI Comments: Pt reports continued hyperglycemia since August 19.  States blood sugars have been between 170 and 320.  Has had associated intermittent headache and fatigue, headache, and polyuria.  Patient reports that around the same time she developed an upper respiratory infection, which resolved, then developed abdominal pain with nausea and diarrhea, which also resolved.  States she has called her primary care provider who has recommended diabetic teaching and taking her medication as directed - pt states she is barely eating to try to control her blood sugars and is taking her metformin as directed. Headache is frontal, with associated lightheadedness and sensitivity to light. Has been intermittent.  Not the worst headache of her life, no trauma.  Gradual onset.  Denies fevers, neck pain or stiffness, any current respiratory symptoms, currently abdominal pain, N/V/D, has urinary frequency but no dysuria or urgency, denies abnormal vaginal discharge or bleeding.  She has never had DKA, never been hospitalized for diabetes.  She was diagnosed with DM 2-3 years ago.    Patient is a 41 y.o. female presenting with headaches. The history is provided by the patient.  Headache  Pertinent negatives include no fever, no shortness of breath, no nausea and no vomiting.    Past Medical History  Diagnosis Date  . DM (diabetes mellitus)   . Hypertension   . Hypokalemia   . Asthma     Past Surgical History  Procedure Date  . Cesarean section     x 3  . Appendectomy   . Vesicovaginal fistula closure w/ tah 2009  . Hernia repair     Family History  Problem Relation Age of Onset  . Heart  attack Mother   . Stroke Mother   . Diabetes Mother   . Hypertension Mother   . Arthritis Mother   . Stroke Father   . Hypertension Sister   . Diabetes Sister   . Seizures Brother   . Diabetes Brother     History  Substance Use Topics  . Smoking status: Former Smoker -- 0.3 packs/day for 22 years    Types: Cigarettes    Quit date: 05/01/2011  . Smokeless tobacco: Never Used  . Alcohol Use: Yes     rare    OB History    Grav Para Term Preterm Abortions TAB SAB Ect Mult Living                  Review of Systems  Constitutional: Negative for fever and chills.  HENT: Negative for sore throat, trouble swallowing, neck pain and neck stiffness.   Eyes: Positive for photophobia.  Respiratory: Negative for cough and shortness of breath.   Cardiovascular: Negative for chest pain.  Gastrointestinal: Negative for nausea, vomiting, abdominal pain and diarrhea.  Genitourinary: Positive for frequency. Negative for dysuria, urgency, vaginal bleeding and vaginal discharge.  Neurological: Positive for light-headedness and headaches. Negative for weakness and numbness.    Allergies  Review of patient's allergies indicates no known allergies.  Home Medications   Current Outpatient Rx  Name Route Sig Dispense Refill  . ALBUTEROL SULFATE HFA 108 (90 BASE) MCG/ACT IN AERS Inhalation Inhale 2 puffs into the lungs every 4 (four) hours as  needed. For shortness of breath    . ASPIRIN EC 325 MG PO TBEC Oral Take 325 mg by mouth daily.    Marland Kitchen BENAZEPRIL-HYDROCHLOROTHIAZIDE 20-25 MG PO TABS Oral Take 1 tablet by mouth daily.    . BUDESONIDE-FORMOTEROL FUMARATE 160-4.5 MCG/ACT IN AERO Inhalation Inhale 2 puffs into the lungs 2 (two) times daily as needed. Shortness of breath    . CETIRIZINE HCL 10 MG PO TABS Oral Take 10 mg by mouth daily as needed. allergies    . FLUTICASONE PROPIONATE 50 MCG/ACT NA SUSP Nasal Place 2 sprays into the nose 2 (two) times daily as needed. For nasal congestion    .  GABAPENTIN 300 MG PO CAPS Oral Take 300 mg by mouth 3 (three) times daily.    . IBUPROFEN 200 MG PO TABS Oral Take 400 mg by mouth every 6 (six) hours as needed. pain    . METFORMIN HCL 500 MG PO TABS Oral Take 500 mg by mouth 2 (two) times daily.     Marland Kitchen MONTELUKAST SODIUM 10 MG PO TABS Oral Take 10 mg by mouth at bedtime.    Marland Kitchen NORTRIPTYLINE HCL 10 MG PO CAPS Oral Take 30 mg by mouth at bedtime.     . OMEPRAZOLE 20 MG PO CPDR Oral Take 1 capsule (20 mg total) by mouth daily. 30 capsule 11  . SERTRALINE HCL 25 MG PO TABS Oral Take 25 mg by mouth daily.      Marland Kitchen SIMVASTATIN 10 MG PO TABS Oral Take 10 mg by mouth at bedtime.      . TOPIRAMATE 100 MG PO TABS Oral Take 100 mg by mouth at bedtime.        BP 151/100  Pulse 86  Temp 99 F (37.2 C) (Oral)  Resp 17  SpO2 97%  Physical Exam  Nursing note and vitals reviewed. Constitutional: She appears well-developed and well-nourished. No distress.  HENT:  Head: Normocephalic and atraumatic.  Neck: Neck supple.  Cardiovascular: Normal rate and regular rhythm.   Pulmonary/Chest: Effort normal and breath sounds normal. No respiratory distress. She has no wheezes. She has no rales.  Abdominal: Soft. She exhibits no distension. There is generalized tenderness. There is no rebound, no guarding and no CVA tenderness.  Neurological: She is alert. She has normal strength. No cranial nerve deficit or sensory deficit. She exhibits normal muscle tone. GCS eye subscore is 4. GCS verbal subscore is 5. GCS motor subscore is 6.       CN II-XII intact, EOMs intact, no pronator drift, grip strengths equal bilaterally; strength 5/5 in all extremities, sensation intact in all extremities; finger to nose, heel to shin, rapid alternating movements normal; gait is normal.    Skin: She is not diaphoretic.    ED Course  Procedures (including critical care time)  Labs Reviewed  CBC WITH DIFFERENTIAL - Abnormal; Notable for the following:    Hemoglobin 11.7 (*)      HCT 35.5 (*)     Neutrophils Relative 40 (*)     Lymphocytes Relative 52 (*)     All other components within normal limits  COMPREHENSIVE METABOLIC PANEL - Abnormal; Notable for the following:    Glucose, Bld 270 (*)     Albumin 3.3 (*)     Total Bilirubin 0.2 (*)     GFR calc non Af Amer 76 (*)     GFR calc Af Amer 88 (*)     All other components within normal limits  GLUCOSE,  CAPILLARY - Abnormal; Notable for the following:    Glucose-Capillary 269 (*)     All other components within normal limits  URINALYSIS, ROUTINE W REFLEX MICROSCOPIC   No results found.  4:43 PM Pt reports mild improvement of headache.  Discussed all results with pt thus far and plan for follow up and d/c home.  Pt to be given PCP resources as she is dissatisfied with her PCP.  UA pending.    1. Hyperglycemia   2. Headache       MDM  Pt with uncontrolled blood glucose for several weeks.  It appears pt has had a series of viral infections that have resolved - I suspect these have increased her blood sugars.  Pt also with headache.  No red flags for headache.  No meningeal signs or focal neurological deficits.  Labs show hyperglycemia without evidence of DKA.  Pt discussed with Rhea Bleacher, PA-C, who assumes care of patient pending UA.  Plan is for d/c home.         [place], [place] 06/01/12 [phone]

## 2012-06-01 NOTE — ED Notes (Signed)
Phenergan ordered from pharmacy 

## 2012-06-01 NOTE — ED Notes (Signed)
Pt reminded of need for urine sample.  

## 2012-06-01 NOTE — ED Notes (Signed)
Pt reports elevated blood sugars 250-low 300s for the last couple days, pt takes metformin. Pt reports fatigue, headache, and polyuria.

## 2012-06-01 NOTE — ED Provider Notes (Signed)
History     CSN: [id number]  Arrival date & time 06/01/12  1154   First MD Initiated Contact with Patient 06/01/12 1340      Chief Complaint  Patient presents with  . Hyperglycemia  . Polyuria  . Headache    (Consider location/radiation/quality/duration/timing/severity/associated sxs/prior treatment) HPI  Past Medical History  Diagnosis Date  . DM (diabetes mellitus)   . Hypertension   . Hypokalemia   . Asthma     Past Surgical History  Procedure Date  . Cesarean section     x 3  . Appendectomy   . Vesicovaginal fistula closure w/ tah 2009  . Hernia repair     Family History  Problem Relation Age of Onset  . Heart attack Mother   . Stroke Mother   . Diabetes Mother   . Hypertension Mother   . Arthritis Mother   . Stroke Father   . Hypertension Sister   . Diabetes Sister   . Seizures Brother   . Diabetes Brother     History  Substance Use Topics  . Smoking status: Former Smoker -- 0.3 packs/day for 22 years    Types: Cigarettes    Quit date: 05/01/2011  . Smokeless tobacco: Never Used  . Alcohol Use: Yes     rare    OB History    Grav Para Term Preterm Abortions TAB SAB Ect Mult Living                  Review of Systems  Allergies  Review of patient's allergies indicates no known allergies.  Home Medications   Current Outpatient Rx  Name Route Sig Dispense Refill  . ALBUTEROL SULFATE HFA 108 (90 BASE) MCG/ACT IN AERS Inhalation Inhale 2 puffs into the lungs every 4 (four) hours as needed. For shortness of breath    . ASPIRIN EC 325 MG PO TBEC Oral Take 325 mg by mouth daily.    Marland Kitchen BENAZEPRIL-HYDROCHLOROTHIAZIDE 20-25 MG PO TABS Oral Take 1 tablet by mouth daily.    . BUDESONIDE-FORMOTEROL FUMARATE 160-4.5 MCG/ACT IN AERO Inhalation Inhale 2 puffs into the lungs 2 (two) times daily as needed. Shortness of breath    . CETIRIZINE HCL 10 MG PO TABS Oral Take 10 mg by mouth daily as needed. allergies    . FLUTICASONE PROPIONATE 50 MCG/ACT NA  SUSP Nasal Place 2 sprays into the nose 2 (two) times daily as needed. For nasal congestion    . GABAPENTIN 300 MG PO CAPS Oral Take 300 mg by mouth 3 (three) times daily.    . IBUPROFEN 200 MG PO TABS Oral Take 400 mg by mouth every 6 (six) hours as needed. pain    . METFORMIN HCL 500 MG PO TABS Oral Take 500 mg by mouth 2 (two) times daily.     Marland Kitchen MONTELUKAST SODIUM 10 MG PO TABS Oral Take 10 mg by mouth at bedtime.    Marland Kitchen NORTRIPTYLINE HCL 10 MG PO CAPS Oral Take 30 mg by mouth at bedtime.     . OMEPRAZOLE 20 MG PO CPDR Oral Take 1 capsule (20 mg total) by mouth daily. 30 capsule 11  . SERTRALINE HCL 25 MG PO TABS Oral Take 25 mg by mouth daily.      Marland Kitchen SIMVASTATIN 10 MG PO TABS Oral Take 10 mg by mouth at bedtime.      . TOPIRAMATE 100 MG PO TABS Oral Take 100 mg by mouth at bedtime.  BP 140/77  Pulse 81  Temp 98.5 F (36.9 C) (Oral)  Resp 16  SpO2 100%  Physical Exam  ED Course  Procedures (including critical care time)  Labs Reviewed  CBC WITH DIFFERENTIAL - Abnormal; Notable for the following:    Hemoglobin 11.7 (*)     HCT 35.5 (*)     Neutrophils Relative 40 (*)     Lymphocytes Relative 52 (*)     All other components within normal limits  COMPREHENSIVE METABOLIC PANEL - Abnormal; Notable for the following:    Glucose, Bld 270 (*)     Albumin 3.3 (*)     Total Bilirubin 0.2 (*)     GFR calc non Af Amer 76 (*)     GFR calc Af Amer 88 (*)     All other components within normal limits  GLUCOSE, CAPILLARY - Abnormal; Notable for the following:    Glucose-Capillary 269 (*)     All other components within normal limits  URINALYSIS, ROUTINE W REFLEX MICROSCOPIC - Abnormal; Notable for the following:    Glucose, UA 250 (*)     All other components within normal limits   No results found.   1. Hyperglycemia   2. Headache     4:43 PM Handoff from [facility]. Patient awaiting UA. She has multiple complaints including hyperglycemia. Will dc when UA returns, rx abx if  appears to be UTI.    Vital signs reviewed and are as follows: Filed Vitals:   06/01/12 1600  BP: 140/77  Pulse: 81  Temp:   Resp:   BP 140/77  Pulse 81  Temp 98.5 F (36.9 C) (Oral)  Resp 16  SpO2 100%  5:05 PM Patient informed of UA results. Urged f/u with PCP. Patient resting comfortably in room.      MDM  UA neg, discharge instructions given by previous PA.         Renne Crigler, [place] 06/01/12 1705

## 2012-06-02 NOTE — ED Provider Notes (Signed)
Medical screening examination/treatment/procedure(s) were performed by non-physician practitioner and as supervising physician I was immediately available for consultation/collaboration.   Loren Racer, MD 06/02/12 [phone]

## 2012-06-03 NOTE — ED Provider Notes (Signed)
Medical screening examination/treatment/procedure(s) were performed by non-physician practitioner and as supervising physician I was immediately available for consultation/collaboration.   Dione Booze, MD 06/03/12 [phone]

## 2012-07-06 ENCOUNTER — Ambulatory Visit: Payer: Medicaid Other | Admitting: Emergency Medicine

## 2012-07-20 ENCOUNTER — Encounter: Payer: Self-pay | Admitting: Emergency Medicine

## 2012-07-29 ENCOUNTER — Ambulatory Visit: Payer: Self-pay | Admitting: Emergency Medicine

## 2012-08-08 ENCOUNTER — Emergency Department (HOSPITAL_COMMUNITY)
Admission: EM | Admit: 2012-08-08 | Discharge: 2012-08-08 | Disposition: A | Discharge status: Home / Self Care | Payer: Self-pay | Attending: Emergency Medicine | Admitting: Emergency Medicine

## 2012-08-08 ENCOUNTER — Encounter (HOSPITAL_COMMUNITY): Payer: Self-pay | Admitting: Physical Medicine and Rehabilitation

## 2012-08-08 ENCOUNTER — Emergency Department (HOSPITAL_COMMUNITY): Payer: Self-pay

## 2012-08-08 DIAGNOSIS — Z79899 Other long term (current) drug therapy: Secondary | ICD-10-CM | POA: Insufficient documentation

## 2012-08-08 DIAGNOSIS — R1013 Epigastric pain: Secondary | ICD-10-CM | POA: Insufficient documentation

## 2012-08-08 DIAGNOSIS — I10 Essential (primary) hypertension: Secondary | ICD-10-CM | POA: Insufficient documentation

## 2012-08-08 DIAGNOSIS — Z87891 Personal history of nicotine dependence: Secondary | ICD-10-CM | POA: Insufficient documentation

## 2012-08-08 DIAGNOSIS — R739 Hyperglycemia, unspecified: Secondary | ICD-10-CM

## 2012-08-08 DIAGNOSIS — R51 Headache: Secondary | ICD-10-CM | POA: Insufficient documentation

## 2012-08-08 DIAGNOSIS — Z7982 Long term (current) use of aspirin: Secondary | ICD-10-CM | POA: Insufficient documentation

## 2012-08-08 DIAGNOSIS — J45909 Unspecified asthma, uncomplicated: Secondary | ICD-10-CM | POA: Insufficient documentation

## 2012-08-08 DIAGNOSIS — Z794 Long term (current) use of insulin: Secondary | ICD-10-CM | POA: Insufficient documentation

## 2012-08-08 DIAGNOSIS — R1110 Vomiting, unspecified: Secondary | ICD-10-CM | POA: Insufficient documentation

## 2012-08-08 LAB — COMPREHENSIVE METABOLIC PANEL
ALT: 23 U/L (ref 0–35)
Calcium: 9.3 mg/dL (ref 8.4–10.5)
Creatinine, Ser: 0.81 mg/dL (ref 0.50–1.10)
GFR calc Af Amer: >90 mL/min (ref >90)
GFR calc non Af Amer: 88 mL/min — ABNORMAL LOW (ref >90)
Glucose, Bld: 155 mg/dL — ABNORMAL HIGH (ref 70–99)
Sodium: 138 mEq/L (ref 135–145)
Total Protein: 6.9 g/dL (ref 6.0–8.3)

## 2012-08-08 LAB — CBC WITH DIFFERENTIAL/PLATELET
Basophils Absolute: 0 10*3/uL (ref 0.0–0.1)
Eosinophils Absolute: 0.1 10*3/uL (ref 0.0–0.7)
Eosinophils Relative: 1 % (ref 0–5)
HCT: 37.3 % (ref 36.0–46.0)
Lymphs Abs: 3.7 10*3/uL (ref 0.7–4.0)
MCH: 28.5 pg (ref 26.0–34.0)
MCV: 85.2 fL (ref 78.0–100.0)
Monocytes Absolute: 0.5 10*3/uL (ref 0.1–1.0)
Platelets: 285 10*3/uL (ref 150–400)
RDW: 13.9 % (ref 11.5–15.5)

## 2012-08-08 LAB — URINALYSIS, ROUTINE W REFLEX MICROSCOPIC
Hgb urine dipstick: NEGATIVE
Nitrite: NEGATIVE
Protein, ur: NEGATIVE mg/dL
Specific Gravity, Urine: 1.025 (ref 1.005–1.030)
Urobilinogen, UA: 0.2 mg/dL (ref 0.0–1.0)

## 2012-08-08 LAB — POCT I-STAT TROPONIN I: Troponin i, poc: 0 ng/mL (ref 0.00–0.08)

## 2012-08-08 LAB — URINE MICROSCOPIC-ADD ON

## 2012-08-08 LAB — GLUCOSE, CAPILLARY

## 2012-08-08 LAB — KETONES, QUALITATIVE: Acetone, Bld: NEGATIVE

## 2012-08-08 MED ORDER — METOCLOPRAMIDE HCL 5 MG/ML IJ SOLN
10.0000 mg | Freq: Once | INTRAMUSCULAR | Status: AC
  Administered 2012-08-08: 10 mg via INTRAVENOUS
  Filled 2012-08-08: qty 2

## 2012-08-08 MED ORDER — SODIUM CHLORIDE 0.9 % IV BOLUS (SEPSIS)
2000.0000 mL | Freq: Once | INTRAVENOUS | Status: AC
  Administered 2012-08-08: 2000 mL via INTRAVENOUS

## 2012-08-08 MED ORDER — DIPHENOXYLATE-ATROPINE 2.5-0.025 MG PO TABS: 1.0000 | ORAL_TABLET | Freq: Four times a day (QID) | ORAL | Status: DC | PRN

## 2012-08-08 MED ORDER — SODIUM CHLORIDE 0.9 % IV SOLN
INTRAVENOUS | Status: DC
  Administered 2012-08-08: 10:00:00 via INTRAVENOUS

## 2012-08-08 MED ORDER — HYDROMORPHONE HCL PF 1 MG/ML IJ SOLN
1.0000 mg | Freq: Once | INTRAMUSCULAR | Status: AC
  Administered 2012-08-08: 1 mg via INTRAVENOUS
  Filled 2012-08-08: qty 1

## 2012-08-08 MED ORDER — METOCLOPRAMIDE HCL 10 MG PO TABS: 10.0000 mg | ORAL_TABLET | Freq: Four times a day (QID) | ORAL | Status: DC

## 2012-08-08 MED ORDER — KETOROLAC TROMETHAMINE 30 MG/ML IJ SOLN
30.0000 mg | Freq: Once | INTRAMUSCULAR | Status: AC
  Administered 2012-08-08: 30 mg via INTRAVENOUS
  Filled 2012-08-08: qty 1

## 2012-08-08 MED ORDER — DIPHENHYDRAMINE HCL 50 MG/ML IJ SOLN
25.0000 mg | Freq: Once | INTRAMUSCULAR | Status: AC
  Administered 2012-08-08: 25 mg via INTRAVENOUS
  Filled 2012-08-08: qty 1

## 2012-08-08 MED ORDER — DIPHENHYDRAMINE HCL 50 MG/ML IJ SOLN
50.0000 mg | Freq: Once | INTRAMUSCULAR | Status: AC
  Administered 2012-08-08: 50 mg via INTRAVENOUS
  Filled 2012-08-08: qty 1

## 2012-08-08 MED ORDER — PANTOPRAZOLE SODIUM 40 MG IV SOLR
40.0000 mg | Freq: Once | INTRAVENOUS | Status: AC
  Administered 2012-08-08: 40 mg via INTRAVENOUS
  Filled 2012-08-08: qty 40

## 2012-08-08 MED ORDER — FAMOTIDINE IN NACL 20-0.9 MG/50ML-% IV SOLN
20.0000 mg | Freq: Once | INTRAVENOUS | Status: AC
  Administered 2012-08-08: 20 mg via INTRAVENOUS
  Filled 2012-08-08: qty 50

## 2012-08-08 MED ORDER — OXYCODONE-ACETAMINOPHEN 5-325 MG PO TABS: 1.0000 | ORAL_TABLET | ORAL | Status: DC | PRN

## 2012-08-08 NOTE — ED Provider Notes (Signed)
1300  Report received from Dr. Carlos American B for this 41 yo female c/o hyperglycemia, epigastric abdominal pain with n/v/d x 2 days.  Denies fever.  pcp is Dr. Petra Kuba.  Moving to CDU to r/o DKA.  Pt also c/o her usual migraine h/a to frontal lobe.     1400  Migraine cocktail with some relief.  No DKA. Suspect viral gastroenteritis.  Will give rx for nausea and diarrhea and pain.  Start pepcid po.  Follow up with pcp this week.  Patient and family agree with plan and are ready for discharge.     Labs Reviewed  CBC WITH DIFFERENTIAL - Abnormal; Notable for the following:    Neutrophils Relative 41 (*)     Lymphocytes Relative 52 (*)     All other components within normal limits  COMPREHENSIVE METABOLIC PANEL - Abnormal; Notable for the following:    Glucose, Bld 155 (*)     Albumin 3.4 (*)     Total Bilirubin 0.2 (*)     GFR calc non Af Amer 88 (*)     All other components within normal limits  URINALYSIS, ROUTINE W REFLEX MICROSCOPIC - Abnormal; Notable for the following:    APPearance CLOUDY (*)     Leukocytes, UA TRACE (*)     All other components within normal limits  GLUCOSE, CAPILLARY - Abnormal; Notable for the following:    Glucose-Capillary 143 (*)     All other components within normal limits  URINE MICROSCOPIC-ADD ON - Abnormal; Notable for the following:    Squamous Epithelial / LPF MANY (*)     Bacteria, UA MANY (*)     All other components within normal limits  LIPASE, BLOOD  KETONES, QUALITATIVE  POCT I-STAT TROPONIN I  URINE CULTURE    Remi Haggard, NP 08/08/12 1809

## 2012-08-08 NOTE — ED Notes (Signed)
Pt receiving fluids, reminded that she needed to give a urine sample. Pt stated she would try again

## 2012-08-08 NOTE — ED Notes (Signed)
Report given to [place], Charity fundraiser. Pt moved to CDU.

## 2012-08-08 NOTE — ED Notes (Signed)
Pt presents to department for evaluation of diffuse abdominal pain, N/V/D and headache. Ongoing x1 week. States symptoms have become progressively worse the past few days. 10/10 pain at the time. She is alert and oriented x4. No signs of acute distress at the time.

## 2012-08-08 NOTE — ED Notes (Signed)
Pt transported to xray 

## 2012-08-08 NOTE — ED Provider Notes (Signed)
History     CSN: [id number]  Arrival date & time 08/08/12  7829   First MD Initiated Contact with Patient 08/08/12 0757      Chief Complaint  Patient presents with  . Abdominal Pain  . Emesis  . Headache    (Consider location/radiation/quality/duration/timing/severity/associated sxs/prior treatment) HPI This 41 year old female has a history of diabetes and complains of a gradual onset 1 week 24-hour a day problem with elevated blood sugars closely 500 with multiple episodes per day of nonbloody vomiting and diarrhea as well as epigastric abdominal pain and gradual onset global headache without fever trauma rash chest pain cough shortness breath dysuria or change in speech vision swallowing or understanding and no focal weakness numbness or incoordination. She is able walk unassisted normally today. She has had polyuria and polydipsia for several days. She's not been hospitalized for DKA in the past. There is no treatment prior to arrival. Past Medical History  Diagnosis Date  . DM (diabetes mellitus)   . Hypertension   . Hypokalemia   . Asthma     Past Surgical History  Procedure Date  . Cesarean section     x 3  . Appendectomy   . Vesicovaginal fistula closure w/ tah 2009  . Hernia repair     Family History  Problem Relation Age of Onset  . Heart attack Mother   . Stroke Mother   . Diabetes Mother   . Hypertension Mother   . Arthritis Mother   . Stroke Father   . Hypertension Sister   . Diabetes Sister   . Seizures Brother   . Diabetes Brother     History  Substance Use Topics  . Smoking status: Former Smoker -- 0.3 packs/day for 22 years    Types: Cigarettes    Quit date: 05/01/2011  . Smokeless tobacco: Never Used  . Alcohol Use: Yes     Comment: rare    OB History    Grav Para Term Preterm Abortions TAB SAB Ect Mult Living                  Review of Systems 10 Systems reviewed and are negative for acute change except as noted in the  HPI. Allergies  Review of patient's allergies indicates no known allergies.  Home Medications   Current Outpatient Rx  Name  Route  Sig  Dispense  Refill  . ALBUTEROL SULFATE HFA 108 (90 BASE) MCG/ACT IN AERS   Inhalation   Inhale 2 puffs into the lungs every 4 (four) hours as needed. For shortness of breath         . ASPIRIN EC 325 MG PO TBEC   Oral   Take 325 mg by mouth daily.         Marland Kitchen BENAZEPRIL-HYDROCHLOROTHIAZIDE 20-25 MG PO TABS   Oral   Take 1 tablet by mouth daily.         . BUDESONIDE-FORMOTEROL FUMARATE 160-4.5 MCG/ACT IN AERO   Inhalation   Inhale 2 puffs into the lungs 2 (two) times daily as needed. Shortness of breath         . CETIRIZINE HCL 10 MG PO TABS   Oral   Take 10 mg by mouth daily as needed. allergies         . FLUTICASONE PROPIONATE 50 MCG/ACT NA SUSP   Nasal   Place 2 sprays into the nose 2 (two) times daily as needed. For nasal congestion         .  GABAPENTIN 300 MG PO CAPS   Oral   Take 300 mg by mouth 3 (three) times daily.         . IBUPROFEN 200 MG PO TABS   Oral   Take 400 mg by mouth every 6 (six) hours as needed. pain         . INSULIN ASPART 100 UNIT/ML [place] SOLN   Subcutaneous   Inject 15 Units into the skin at bedtime.         Marland Kitchen METFORMIN HCL 500 MG PO TABS   Oral   Take 500 mg by mouth 2 (two) times daily.          Marland Kitchen MONTELUKAST SODIUM 10 MG PO TABS   Oral   Take 10 mg by mouth at bedtime.         Marland Kitchen NORTRIPTYLINE HCL 10 MG PO CAPS   Oral   Take 30 mg by mouth at bedtime.          . OMEPRAZOLE 20 MG PO CPDR   Oral   Take 1 capsule (20 mg total) by mouth daily.   30 capsule   11   . SERTRALINE HCL 25 MG PO TABS   Oral   Take 25 mg by mouth daily.           Marland Kitchen SIMVASTATIN 10 MG PO TABS   Oral   Take 10 mg by mouth at bedtime.           . TOPIRAMATE 100 MG PO TABS   Oral   Take 100 mg by mouth at bedtime.           Marland Kitchen DIPHENOXYLATE-ATROPINE 2.5-0.025 MG PO TABS   Oral   Take 1  tablet by mouth 4 (four) times daily as needed for diarrhea or loose stools.   30 tablet   0   . METOCLOPRAMIDE HCL 10 MG PO TABS   Oral   Take 1 tablet (10 mg total) by mouth every 6 (six) hours.   30 tablet   0   . OXYCODONE-ACETAMINOPHEN 5-325 MG PO TABS   Oral   Take 1 tablet by mouth every 4 (four) hours as needed for pain.   8 tablet   0     BP 109/69  Pulse 72  Temp 98.6 F (37 C) (Oral)  Resp 15  SpO2 100%  Physical Exam  Nursing note and vitals reviewed. Constitutional:       Awake, alert, nontoxic appearance with baseline speech for patient.  HENT:  Head: Atraumatic.  Mouth/Throat: No oropharyngeal exudate.       Oral mucosa somewhat dry  Eyes: EOM are normal. Pupils are equal, round, and reactive to light. Right eye exhibits no discharge. Left eye exhibits no discharge.  Neck: Neck supple.  Cardiovascular: Normal rate and regular rhythm.   No murmur heard. Pulmonary/Chest: Effort normal and breath sounds normal. No stridor. No respiratory distress. She has no wheezes. She has no rales. She exhibits no tenderness.  Abdominal: Soft. Bowel sounds are normal. She exhibits no distension and no mass. There is tenderness. There is no rebound and no guarding.       Minimal epigastric tenderness  Musculoskeletal: She exhibits no tenderness.       Baseline ROM, moves extremities with no obvious new focal weakness.  Lymphadenopathy:    She has no cervical adenopathy.  Neurological: She is alert.       Awake, alert, cooperative and aware of situation; motor strength  bilaterally; sensation normal to light touch bilaterally; peripheral visual fields full to confrontation; no facial asymmetry; tongue midline; major cranial nerves appear intact; no pronator drift, normal finger to nose bilaterally  Skin: No rash noted.  Psychiatric: She has a normal mood and affect.    ED Course  Procedures (including critical care time) ECG: Normal sinus rhythm, surgical rate 78,  normal axis, normal intervals, no acute ischemic changes noted, no significant change noted compared with June 2013  Medical screening examination/treatment/procedure(s) were conducted as a shared visit with non-physician practitioner(s) and myself.  I personally evaluated the patient during the encounter.  Will move Pt patient to the CDU for labs, fluids, medications, and reevaluation. Labs Reviewed  CBC WITH DIFFERENTIAL - Abnormal; Notable for the following:    Neutrophils Relative 41 (*)     Lymphocytes Relative 52 (*)     All other components within normal limits  COMPREHENSIVE METABOLIC PANEL - Abnormal; Notable for the following:    Glucose, Bld 155 (*)     Albumin 3.4 (*)     Total Bilirubin 0.2 (*)     GFR calc non Af Amer 88 (*)     All other components within normal limits  URINALYSIS, ROUTINE W REFLEX MICROSCOPIC - Abnormal; Notable for the following:    APPearance CLOUDY (*)     Leukocytes, UA TRACE (*)     All other components within normal limits  GLUCOSE, CAPILLARY - Abnormal; Notable for the following:    Glucose-Capillary 143 (*)     All other components within normal limits  URINE MICROSCOPIC-ADD ON - Abnormal; Notable for the following:    Squamous Epithelial / LPF MANY (*)     Bacteria, UA MANY (*)     All other components within normal limits  LIPASE, BLOOD  KETONES, QUALITATIVE  POCT I-STAT TROPONIN I  URINE CULTURE  LAB REPORT - SCANNED   No results found.   1. Epigastric pain   2. Hyperglycemia       MDM         Hurman Horn, MD 08/11/12 2329

## 2012-08-08 NOTE — ED Notes (Signed)
CDU full at the time. Nurse to call back when able to take patient. Charge RN aware.

## 2012-08-09 LAB — URINE CULTURE: Colony Count: 75000

## 2012-08-11 ENCOUNTER — Encounter: Payer: Self-pay | Admitting: Emergency Medicine

## 2012-08-11 ENCOUNTER — Ambulatory Visit (INDEPENDENT_AMBULATORY_CARE_PROVIDER_SITE_OTHER): Payer: Self-pay | Admitting: Emergency Medicine

## 2012-08-11 VITALS — BP 140/100 | HR 78 | Temp 98.0°F | Ht 61.0 in | Wt 249.6 lb

## 2012-08-11 DIAGNOSIS — G4733 Obstructive sleep apnea (adult) (pediatric): Secondary | ICD-10-CM

## 2012-08-11 DIAGNOSIS — J302 Other seasonal allergic rhinitis: Secondary | ICD-10-CM | POA: Insufficient documentation

## 2012-08-11 DIAGNOSIS — J309 Allergic rhinitis, unspecified: Secondary | ICD-10-CM | POA: Insufficient documentation

## 2012-08-11 DIAGNOSIS — R059 Cough, unspecified: Secondary | ICD-10-CM

## 2012-08-11 DIAGNOSIS — R05 Cough: Secondary | ICD-10-CM

## 2012-08-11 DIAGNOSIS — R053 Chronic cough: Secondary | ICD-10-CM | POA: Insufficient documentation

## 2012-08-11 NOTE — Patient Instructions (Addendum)
Please continue your CPAP every night Use your zyrtec and singulair when your allergies flare Follow with Dr Delton Coombes in 6 months or sooner if you have any problems

## 2012-08-11 NOTE — Progress Notes (Signed)
  Subjective:    Patient ID: [name], female    DOB: [birth year], 41 y.o.   MRN: [id number] HPI 41 yo smoker, hx Asthma dx in her 20's, and UA irritation. Hospitalized 8/12-8/17 with an exacerbation in the setting possible trachiitis, viral URI.  She tells me that she has in the past had many hospitalizations for flares. Has had PFT's in the past but not recently. She has been managed with Advair in the past, many other BD's. Sent out of the hospital on Symbicort bid, prn albuterol. She is on zyrtec, still has breakthrough allergy symptoms. She is scheduled for tonsillectomy and adenoidectomy in Sept 14.   ROV 09/11/11 -- follow up hx allergies, UA irritation, ? Asthma. Has been rx as asthma since in her 38's.  Tells me that she has had recurrent episode difficulty w her breathing, went to ED and rx for asthma exac - she had SOB, wheeze, severe cough. Received nebs with little response, pred, abx. Still on the prednisone. She had tonsilectomy and adenoidectomy as planned.   ROV 12/04/11 -- follow up hx allergies, UA irritation, ? Asthma. She is on Symbicort, has a SABA that she uses. We stopped Symbicort last time.  Fluticasone nasal spray, zyrtec, singulair. Needs to start NSW. She is having OSA sx, has had a PSG before about a yr ago - prompted her tonsilectomy and adenoidectomy. She has gained wt since that test.   PULMONARY FUNCTON TEST 09/11/2011  FVC 2.58  FEV1 2.14  FEV1/FVC 82.9  FVC  % Predicted 81  FEV % Predicted 87  FeF 25-75 2.59  FeF 25-75 % Predicted 2.98   ROV 08/11/12 -- hx allergies and UA irritation, reassuring spirometry. We stopped Symbicort > she didn't . Her PSG showed OSA, now on CPAP. She is using singulair, fluticasone, zyrtec all prn depending when she has drainage and UA irritation. She feels much better since starting CPAP.     Objective:   Physical Exam Filed Vitals:   08/11/12 1632  BP: 140/100  Pulse: 78  Temp: 98 F (36.7 C)   Gen: Pleasant,  obese, in no distress.   ENT: No lesions,  mouth clear,  Very narrow post pharynx  Neck: No JVD, no TMG, no carotid bruits  Lungs: No use of accessory muscles, no dullness to percussion, clear without rales or rhonchi  Cardiovascular: RRR, heart sounds normal, no murmur or gallops, no peripheral edema  Musculoskeletal: No deformities, no cyanosis or clubbing  Neuro: alert, non focal  Skin: Warm, no lesions or rashes     Assessment & Plan:  Chronic cough Not currently problematic, but at risk to flare. If she does, then I would change to benazepril to an alternative, restart allergy regimen  Sleep apnea, obstructive Continue CPAP  Allergic rhinitis, seasonal Uses her singulair and zyrtec prn. If she flares then will make a scheduled regimen, include the NSW and nasal steroid.

## 2012-08-11 NOTE — Assessment & Plan Note (Signed)
Uses her singulair and zyrtec prn. If she flares then will make a scheduled regimen, include the NSW and nasal steroid.

## 2012-08-11 NOTE — Assessment & Plan Note (Signed)
Continue CPAP.  

## 2012-08-11 NOTE — Assessment & Plan Note (Signed)
Not currently problematic, but at risk to flare. If she does, then I would change to benazepril to an alternative, restart allergy regimen

## 2012-09-23 IMAGING — CR DG CHEST 2V
2 series · 2 of 2 positions shown · non-contrast
Comparison: 08/31/2011; 05/04/2011; 05/01/2011

CLINICAL DATA: Chest pain, shortness of breath, history of asthma

CHEST - 2 VIEW

[w chest pa]
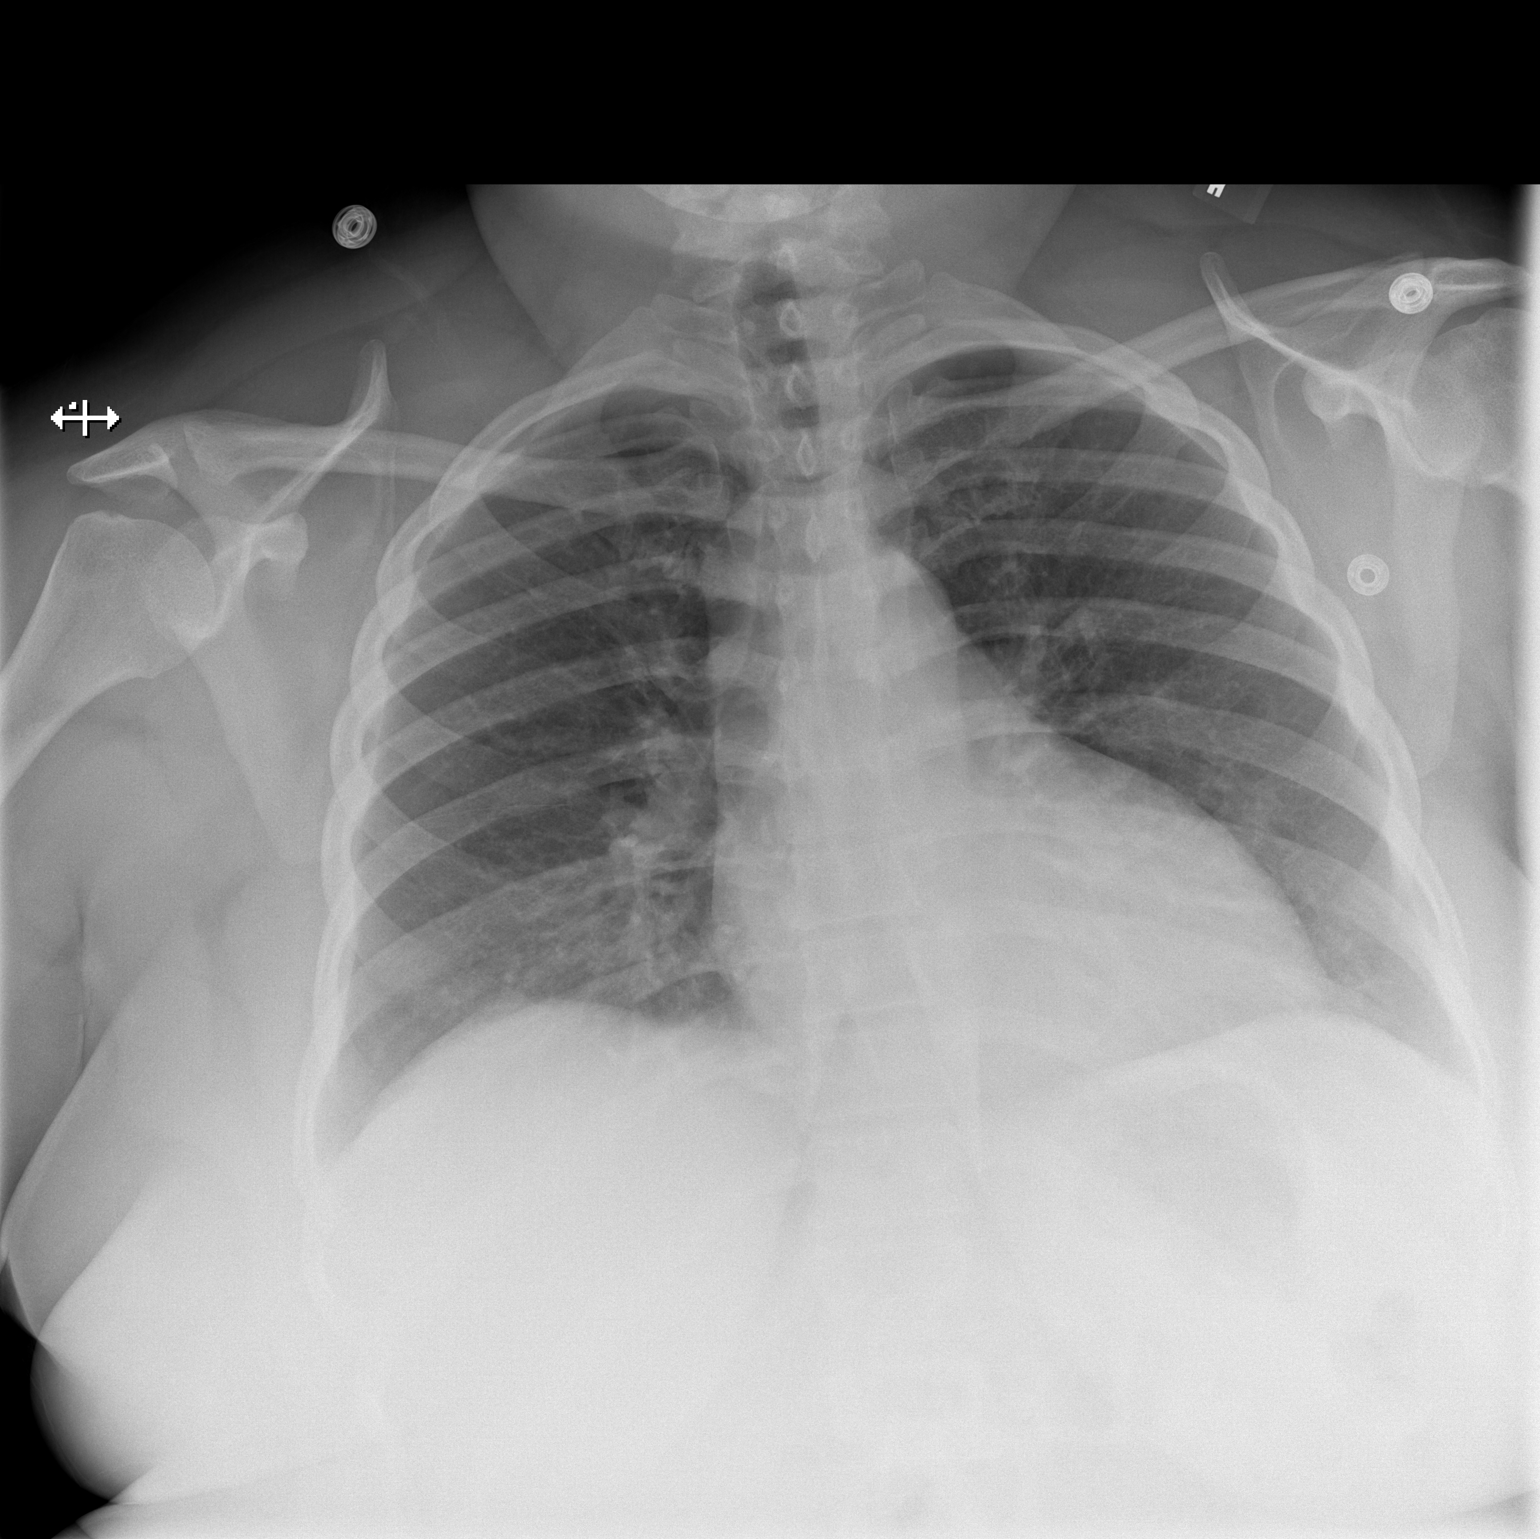

[w chest lat]
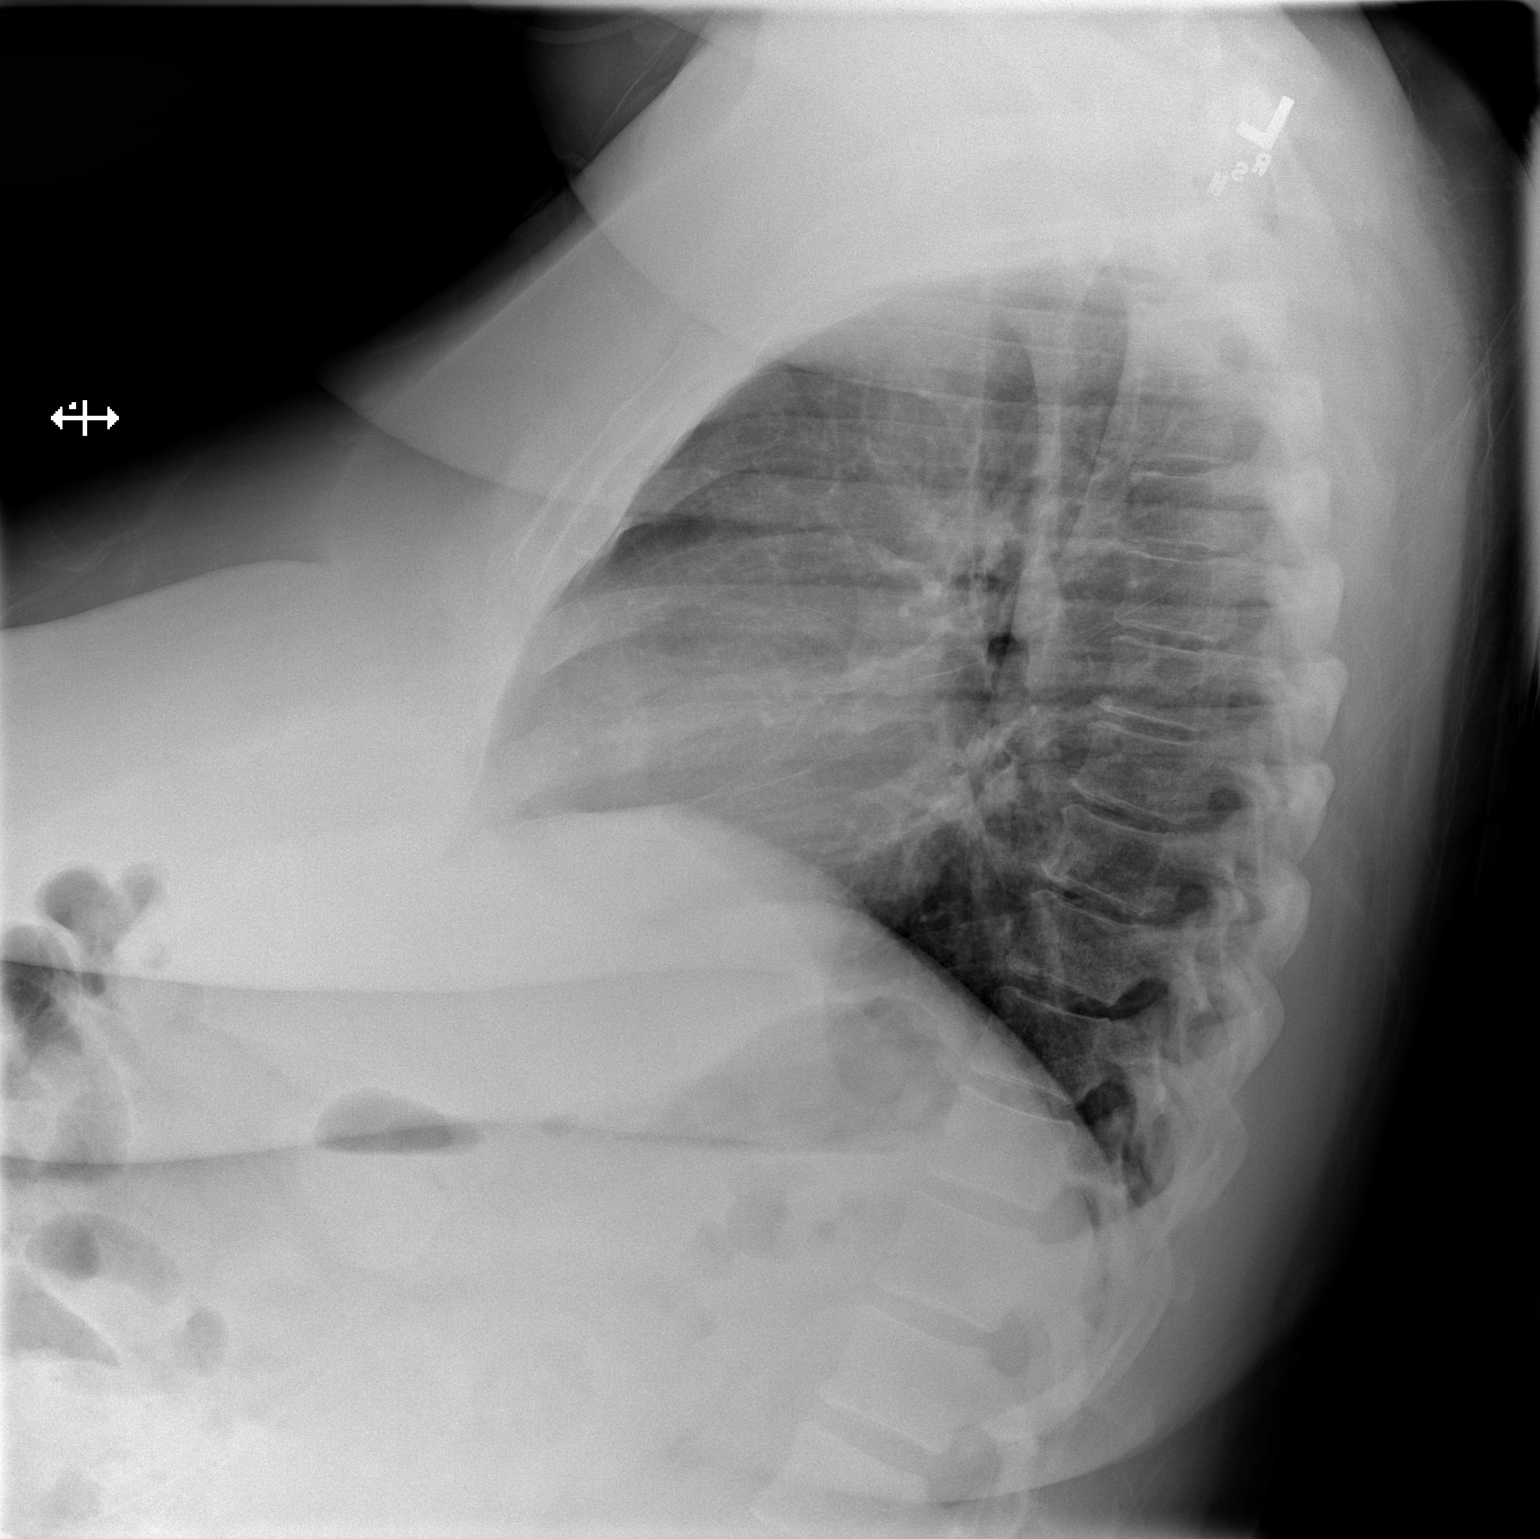

[2 of 2 positions shown; findings below may reference images not displayed]

FINDINGS: Grossly unchanged and borderline enlarged cardiac
silhouette.  Normal mediastinal contours.  No focal parenchymal
opacities.  No pleural effusion or pneumothorax.  Unchanged bones.
IMPRESSION: No acute cardiopulmonary disease.

## 2012-09-24 ENCOUNTER — Encounter (HOSPITAL_COMMUNITY): Payer: Self-pay | Admitting: *Deleted

## 2012-09-24 ENCOUNTER — Emergency Department (HOSPITAL_COMMUNITY): Payer: Self-pay

## 2012-09-24 ENCOUNTER — Emergency Department (HOSPITAL_COMMUNITY)
Admission: EM | Admit: 2012-09-24 | Discharge: 2012-09-25 | Disposition: A | Discharge status: Home / Self Care | Payer: Self-pay | Attending: Emergency Medicine | Admitting: Emergency Medicine

## 2012-09-24 DIAGNOSIS — E119 Type 2 diabetes mellitus without complications: Secondary | ICD-10-CM | POA: Insufficient documentation

## 2012-09-24 DIAGNOSIS — Z794 Long term (current) use of insulin: Secondary | ICD-10-CM | POA: Insufficient documentation

## 2012-09-24 DIAGNOSIS — J45909 Unspecified asthma, uncomplicated: Secondary | ICD-10-CM | POA: Insufficient documentation

## 2012-09-24 DIAGNOSIS — K029 Dental caries, unspecified: Secondary | ICD-10-CM | POA: Insufficient documentation

## 2012-09-24 DIAGNOSIS — Z87891 Personal history of nicotine dependence: Secondary | ICD-10-CM | POA: Insufficient documentation

## 2012-09-24 DIAGNOSIS — R51 Headache: Secondary | ICD-10-CM | POA: Insufficient documentation

## 2012-09-24 DIAGNOSIS — I10 Essential (primary) hypertension: Secondary | ICD-10-CM | POA: Insufficient documentation

## 2012-09-24 DIAGNOSIS — Z7982 Long term (current) use of aspirin: Secondary | ICD-10-CM | POA: Insufficient documentation

## 2012-09-24 DIAGNOSIS — Z79899 Other long term (current) drug therapy: Secondary | ICD-10-CM | POA: Insufficient documentation

## 2012-09-24 LAB — CBC WITH DIFFERENTIAL/PLATELET
Eosinophils Absolute: 0.1 10*3/uL (ref 0.0–0.7)
Eosinophils Relative: 1 % (ref 0–5)
Lymphs Abs: 4.2 10*3/uL — ABNORMAL HIGH (ref 0.7–4.0)
MCH: 28.4 pg (ref 26.0–34.0)
MCV: 86.3 fL (ref 78.0–100.0)
Monocytes Relative: 5 % (ref 3–12)
Platelets: 276 10*3/uL (ref 150–400)
RBC: 4.22 MIL/uL (ref 3.87–5.11)

## 2012-09-24 LAB — POCT I-STAT, CHEM 8
Creatinine, Ser: 0.8 mg/dL (ref 0.50–1.10)
Glucose, Bld: 143 mg/dL — ABNORMAL HIGH (ref 70–99)
Hemoglobin: 12.2 g/dL (ref 12.0–15.0)
TCO2: 25 mmol/L (ref 0–100)

## 2012-09-24 MED ORDER — HYDROMORPHONE HCL PF 1 MG/ML IJ SOLN
1.0000 mg | Freq: Once | INTRAMUSCULAR | Status: AC
  Administered 2012-09-24: 1 mg via INTRAVENOUS
  Filled 2012-09-24: qty 1

## 2012-09-24 MED ORDER — IBUPROFEN 800 MG PO TABS: 800.0000 mg | ORAL_TABLET | Freq: Three times a day (TID) | ORAL | Status: DC | PRN

## 2012-09-24 MED ORDER — SODIUM CHLORIDE 0.9 % IV BOLUS (SEPSIS)
1000.0000 mL | Freq: Once | INTRAVENOUS | Status: AC
  Administered 2012-09-24: 1000 mL via INTRAVENOUS

## 2012-09-24 MED ORDER — IOHEXOL 300 MG/ML  SOLN
75.0000 mL | Freq: Once | INTRAMUSCULAR | Status: AC | PRN
  Administered 2012-09-24: 75 mL via INTRAVENOUS

## 2012-09-24 MED ORDER — OXYCODONE-ACETAMINOPHEN 5-325 MG PO TABS
2.0000 | ORAL_TABLET | Freq: Once | ORAL | Status: AC
  Administered 2012-09-24: 2 via ORAL
  Filled 2012-09-24: qty 2

## 2012-09-24 MED ORDER — METOCLOPRAMIDE HCL 5 MG/ML IJ SOLN
10.0000 mg | Freq: Once | INTRAMUSCULAR | Status: AC
  Administered 2012-09-24: 10 mg via INTRAVENOUS
  Filled 2012-09-24: qty 2

## 2012-09-24 MED ORDER — SODIUM CHLORIDE 0.9 % IV BOLUS (SEPSIS)
500.0000 mL | Freq: Once | INTRAVENOUS | Status: AC
  Administered 2012-09-24: 500 mL via INTRAVENOUS

## 2012-09-24 MED ORDER — PENICILLIN V POTASSIUM 500 MG PO TABS: 500.0000 mg | ORAL_TABLET | Freq: Three times a day (TID) | ORAL | Status: DC

## 2012-09-24 MED ORDER — OXYCODONE-ACETAMINOPHEN 5-325 MG PO TABS: 1.0000 | ORAL_TABLET | ORAL | Status: DC | PRN

## 2012-09-24 MED ORDER — DIPHENHYDRAMINE HCL 50 MG/ML IJ SOLN
25.0000 mg | Freq: Once | INTRAMUSCULAR | Status: AC
Start: 2012-09-24 — End: 2012-09-24
  Administered 2012-09-24: 25 mg via INTRAVENOUS
  Filled 2012-09-24: qty 1

## 2012-09-24 NOTE — ED Notes (Signed)
Patient reports migraine and photosensitivity still present

## 2012-09-24 NOTE — ED Provider Notes (Addendum)
42 year old female history of migraines has been having a toothache of her right upper molar for about the last week. After HER-2 started hurting, she's been having a bifrontal headache which is typical of her migraines. There's been associated photophobia and nausea. Denies visual disturbance. Denies fever or chills. On exam, she does demonstrate photophobia. Neck is nontender and supple. There is significant caries of tooth #3 with significant tenderness to percussion. She'll be given a migraine cocktail for headache, dental block will be given to try and give her temporary relief of pain, and she will be referred to the on-call dentist.  I saw and evaluated the patient, reviewed the resident's note and I agree with the findings and plan.   Dione Booze, MD 09/24/12 Aldona Lento  Dione Booze, MD 09/24/12 [phone]

## 2012-09-24 NOTE — ED Notes (Signed)
Reports having right upper toothache that is now causing severe headache. Had n/v this am.

## 2012-09-24 NOTE — ED Notes (Signed)
Resident MD at bedside.

## 2012-09-24 NOTE — ED Provider Notes (Signed)
History     CSN: [id number]  Arrival date & time 09/24/12  1626   None     Chief Complaint  Patient presents with  . Headache    HPI chief complaint: Headache. Onset 4 days ago. Location: Global. Not improved or worsened by anything. Quality: dull. Severity: Moderate. Timing: Constant. Context: Identical to past migraines. Regarding social history see nurse's notes. I have reviewed patient's past medical, past surgical, social history as well as medications and allergies. Positive family history for myocardial infarction and CVA. Associated dental pain. Patient denies vision change, hearing loss, fever, neck pain, neck stiffness, vomiting, weakness, or seizures, gait abnormalities.  Past Medical History  Diagnosis Date  . DM (diabetes mellitus)   . Hypertension   . Hypokalemia   . Asthma     Past Surgical History  Procedure Date  . Cesarean section     x 3  . Appendectomy   . Vesicovaginal fistula closure w/ tah 2009  . Hernia repair     Family History  Problem Relation Age of Onset  . Heart attack Mother   . Stroke Mother   . Diabetes Mother   . Hypertension Mother   . Arthritis Mother   . Stroke Father   . Hypertension Sister   . Diabetes Sister   . Seizures Brother   . Diabetes Brother     History  Substance Use Topics  . Smoking status: Former Smoker -- 0.3 packs/day for 22 years    Types: Cigarettes    Quit date: 05/01/2011  . Smokeless tobacco: Never Used  . Alcohol Use: Yes     Comment: rare    OB History    Grav Para Term Preterm Abortions TAB SAB Ect Mult Living                  Review of Systems 10 Systems reviewed and are negative for acute change except as noted in the HPI.  Allergies  Review of patient's allergies indicates no known allergies.  Home Medications   Current Outpatient Rx  Name  Route  Sig  Dispense  Refill  . ALBUTEROL SULFATE HFA 108 (90 BASE) MCG/ACT IN AERS   Inhalation   Inhale 2 puffs into the lungs every 4  (four) hours as needed. For shortness of breath         . ASPIRIN EC 325 MG PO TBEC   Oral   Take 325 mg by mouth daily.         Marland Kitchen BENAZEPRIL-HYDROCHLOROTHIAZIDE 20-25 MG PO TABS   Oral   Take 1 tablet by mouth daily.         . BUDESONIDE-FORMOTEROL FUMARATE 160-4.5 MCG/ACT IN AERO   Inhalation   Inhale 2 puffs into the lungs 2 (two) times daily as needed. Shortness of breath         . CETIRIZINE HCL 10 MG PO TABS   Oral   Take 10 mg by mouth daily as needed. allergies         . DIPHENOXYLATE-ATROPINE 2.5-0.025 MG PO TABS   Oral   Take 1 tablet by mouth 4 (four) times daily as needed for diarrhea or loose stools.   30 tablet   0   . FLUTICASONE PROPIONATE 50 MCG/ACT NA SUSP   Nasal   Place 2 sprays into the nose 2 (two) times daily as needed. For nasal congestion         . GABAPENTIN 300 MG PO CAPS   Oral  Take 300 mg by mouth 3 (three) times daily.         . IBUPROFEN 200 MG PO TABS   Oral   Take 400 mg by mouth every 6 (six) hours as needed. pain         . INSULIN ASPART 100 UNIT/ML [place] SOLN   Subcutaneous   Inject 15 Units into the skin at bedtime.         Marland Kitchen METFORMIN HCL 500 MG PO TABS   Oral   Take 500 mg by mouth 2 (two) times daily.          Marland Kitchen METOCLOPRAMIDE HCL 10 MG PO TABS   Oral   Take 1 tablet (10 mg total) by mouth every 6 (six) hours.   30 tablet   0   . MONTELUKAST SODIUM 10 MG PO TABS   Oral   Take 10 mg by mouth at bedtime.         Marland Kitchen NORTRIPTYLINE HCL 10 MG PO CAPS   Oral   Take 30 mg by mouth at bedtime.          . OMEPRAZOLE 20 MG PO CPDR   Oral   Take 1 capsule (20 mg total) by mouth daily.   30 capsule   11   . OXYCODONE-ACETAMINOPHEN 5-325 MG PO TABS   Oral   Take 1 tablet by mouth every 4 (four) hours as needed for pain.   8 tablet   0   . SERTRALINE HCL 25 MG PO TABS   Oral   Take 25 mg by mouth daily.           Marland Kitchen SIMVASTATIN 10 MG PO TABS   Oral   Take 10 mg by mouth at bedtime.             . TOPIRAMATE 100 MG PO TABS   Oral   Take 100 mg by mouth at bedtime.             BP 159/113  Pulse 92  Temp 98.8 F (37.1 C) (Oral)  Resp 20  SpO2 96%  Physical Exam  Constitutional: She is oriented to person, place, and time. She appears well-developed and well-nourished. No distress.  HENT:  Head: Normocephalic and atraumatic.  Right Ear: Hearing, tympanic membrane, external ear and ear canal normal.  Left Ear: Hearing, tympanic membrane, external ear and ear canal normal.  Nose: Nose normal. Right sinus exhibits no maxillary sinus tenderness and no frontal sinus tenderness. Left sinus exhibits no maxillary sinus tenderness and no frontal sinus tenderness.  Mouth/Throat: Uvula is midline, oropharynx is clear and moist and mucous membranes are normal.         Dental caries and tenderness to palpation noted over the most posterior right upper molar. No evidence of a periapical abscess. No buccal abscess.  Eyes: Conjunctivae normal are normal. Right eye exhibits no discharge. Left eye exhibits no discharge. No scleral icterus.  Neck: Normal range of motion. Neck supple.  Cardiovascular: Normal rate, regular rhythm, normal heart sounds and intact distal pulses.   No murmur heard. Pulmonary/Chest: Effort normal and breath sounds normal. No respiratory distress.  Abdominal: Soft. Bowel sounds are normal. She exhibits no distension. There is no tenderness.  Musculoskeletal: Normal range of motion. She exhibits no tenderness.  Neurological: She is alert and oriented to person, place, and time. She has normal strength. No cranial nerve deficit or sensory deficit. She displays a negative Romberg sign. GCS eye subscore is  4. GCS verbal subscore is 5. GCS motor subscore is 6.  Skin: Skin is warm and dry. She is not diaphoretic.  Psychiatric: She has a normal mood and affect.    ED Course  Procedures (including critical care time)  Labs Reviewed  CBC WITH DIFFERENTIAL - Abnormal;  Notable for the following:    Neutrophils Relative 38 (*)     Lymphocytes Relative 56 (*)     Lymphs Abs 4.2 (*)     All other components within normal limits  POCT I-STAT, CHEM 8 - Abnormal; Notable for the following:    BUN 4 (*)     Glucose, Bld 143 (*)     All other components within normal limits   Ct Head Wo Contrast  09/24/2012  *RADIOLOGY REPORT*  Clinical Data:  Headache.  CT HEAD WITHOUT CONTRAST CT MAXILLOFACIAL WITHOUT CONTRAST  Technique:  Multidetector CT imaging of the head and maxillofacial structures were performed using the standard protocol without intravenous contrast. Multiplanar CT image reconstructions of the maxillofacial structures were also generated.  Comparison:  Head CT dated 10/25/2005.  CT HEAD  Findings: Normal appearing cerebral hemispheres and posterior fossa structures.  Normal size and position of the ventricles. Unremarkable bones and included paranasal sinuses.  IMPRESSION: Normal examination.  CT MAXILLOFACIAL  Findings:   Inferior right maxillary sinus mucosal thickening with a maximum thickness of 6 mm.  Small left maxillary sinus retention cysts.  The facial bones have normal appearances.  Unremarkable upper cervical spine.  IMPRESSION: Chronic right maxillary sinusitis.   Original Report Authenticated By: Beckie Salts, M.D.    Ct Maxillofacial W/cm  09/24/2012  *RADIOLOGY REPORT*  Clinical Data:  Headache.  CT HEAD WITHOUT CONTRAST CT MAXILLOFACIAL WITHOUT CONTRAST  Technique:  Multidetector CT imaging of the head and maxillofacial structures were performed using the standard protocol without intravenous contrast. Multiplanar CT image reconstructions of the maxillofacial structures were also generated.  Comparison:  Head CT dated 10/25/2005.  CT HEAD  Findings: Normal appearing cerebral hemispheres and posterior fossa structures.  Normal size and position of the ventricles. Unremarkable bones and included paranasal sinuses.  IMPRESSION: Normal examination.  CT  MAXILLOFACIAL  Findings:   Inferior right maxillary sinus mucosal thickening with a maximum thickness of 6 mm.  Small left maxillary sinus retention cysts.  The facial bones have normal appearances.  Unremarkable upper cervical spine.  IMPRESSION: Chronic right maxillary sinusitis.   Original Report Authenticated By: Beckie Salts, M.D.      1. Dental caries   2. Headache      Dental block: Posterior superior alveolar nerve block with 1 ml of bupivacaine. Landmarks were identified. The patient tolerated procedure well. No acute complications. Patient verbalized  Immediate improvement in pain. MDM  Patient is a 42 year old female past medical history of migraines who presents with 4 days of headache identical to past migraines. Vital stable. Afebrile. Concern for meningitis or encephalitis on exam. No history of trauma. Venous sinus thrombosis doubtful. No focal neurologic deficits noted. Patient did have some improvement in her headache from Reglan and Benadryl and IV fluids and minimal improvement from 2 Percocet. Given headaches did not improve substantially a CT face and CT head were performed which did not demonstrate any intracranial abnormalities nor did it demonstrate any maxillary abscess. Dilaudid provided with improvement in symptoms. After Dilaudid patient states she felt comfortable and was ready to go home. Discharge home on Pen-V K and dental followup tomorrow. Precautions discussed and understanding verbalized.  Consuello Masse, MD 09/24/12 2356

## 2012-12-26 ENCOUNTER — Encounter (HOSPITAL_COMMUNITY): Payer: Self-pay | Admitting: Physical Medicine and Rehabilitation

## 2012-12-26 ENCOUNTER — Emergency Department (HOSPITAL_COMMUNITY)
Admission: EM | Admit: 2012-12-26 | Discharge: 2012-12-26 | Disposition: A | Discharge status: Home / Self Care | Payer: Self-pay | Attending: Emergency Medicine | Admitting: Emergency Medicine

## 2012-12-26 DIAGNOSIS — Z7982 Long term (current) use of aspirin: Secondary | ICD-10-CM | POA: Insufficient documentation

## 2012-12-26 DIAGNOSIS — IMO0002 Reserved for concepts with insufficient information to code with codable children: Secondary | ICD-10-CM | POA: Insufficient documentation

## 2012-12-26 DIAGNOSIS — Z794 Long term (current) use of insulin: Secondary | ICD-10-CM | POA: Insufficient documentation

## 2012-12-26 DIAGNOSIS — Z862 Personal history of diseases of the blood and blood-forming organs and certain disorders involving the immune mechanism: Secondary | ICD-10-CM | POA: Insufficient documentation

## 2012-12-26 DIAGNOSIS — J45909 Unspecified asthma, uncomplicated: Secondary | ICD-10-CM | POA: Insufficient documentation

## 2012-12-26 DIAGNOSIS — E119 Type 2 diabetes mellitus without complications: Secondary | ICD-10-CM | POA: Insufficient documentation

## 2012-12-26 DIAGNOSIS — M545 Low back pain, unspecified: Secondary | ICD-10-CM | POA: Insufficient documentation

## 2012-12-26 DIAGNOSIS — Z8639 Personal history of other endocrine, nutritional and metabolic disease: Secondary | ICD-10-CM | POA: Insufficient documentation

## 2012-12-26 DIAGNOSIS — I10 Essential (primary) hypertension: Secondary | ICD-10-CM | POA: Insufficient documentation

## 2012-12-26 DIAGNOSIS — Z79899 Other long term (current) drug therapy: Secondary | ICD-10-CM | POA: Insufficient documentation

## 2012-12-26 DIAGNOSIS — Z87891 Personal history of nicotine dependence: Secondary | ICD-10-CM | POA: Insufficient documentation

## 2012-12-26 DIAGNOSIS — M549 Dorsalgia, unspecified: Secondary | ICD-10-CM

## 2012-12-26 MED ORDER — DIAZEPAM 5 MG PO TABS
5.0000 mg | ORAL_TABLET | Freq: Once | ORAL | Status: AC
  Administered 2012-12-26: 5 mg via ORAL
  Filled 2012-12-26: qty 1

## 2012-12-26 MED ORDER — DIAZEPAM 5 MG PO TABS: 5.0000 mg | ORAL_TABLET | Freq: Four times a day (QID) | ORAL | Status: DC | PRN

## 2012-12-26 MED ORDER — OXYCODONE-ACETAMINOPHEN 5-325 MG PO TABS: 1.0000 | ORAL_TABLET | ORAL | Status: DC | PRN

## 2012-12-26 MED ORDER — HYDROMORPHONE HCL PF 1 MG/ML IJ SOLN
1.0000 mg | Freq: Once | INTRAMUSCULAR | Status: AC
  Administered 2012-12-26: 1 mg via INTRAMUSCULAR
  Filled 2012-12-26: qty 1

## 2012-12-26 NOTE — ED Notes (Signed)
Pt presents to department for evaluation of back pain radiating down R leg. Ongoing x2 weeks. 10/10 pain at the time. Pt also states hyperglycemia, ran out of insulin several days ago. Pt is alert and oriented x4.

## 2012-12-26 NOTE — ED Provider Notes (Signed)
History     CSN: [id number]  Arrival date & time 12/26/12  1057   First MD Initiated Contact with Patient 12/26/12 1122      Chief Complaint  Patient presents with  . Back Pain     The history is provided by the patient.   patient reports intermittent right lower back pain over the past couple years.  She's been on pain medicine and spasm medicine before in the past.  She tried some this at home without improvement in her symptoms.  Her primary care physician is aware of her symptoms but has not had any advanced imaging.  Her pain in her right low back worsened over the past 7-9 days.  She reports her back pain is worse with movement and palpation.  No fevers or chills.  No numbness or tingling.  No weakness of her right lower extremity.  The pain in her right low back radiates down towards her right buttock and her right thigh.  No rash or redness.  No fevers or chills.  No abdominal pain chest pain.  The patient is morbidly obese.  She is a diabetic.  She has lost faith in her primary care physician.  Past Medical History  Diagnosis Date  . DM (diabetes mellitus)   . Hypertension   . Hypokalemia   . Asthma     Past Surgical History  Procedure Laterality Date  . Cesarean section      x 3  . Appendectomy    . Vesicovaginal fistula closure w/ tah  2009  . Hernia repair      Family History  Problem Relation Age of Onset  . Heart attack Mother   . Stroke Mother   . Diabetes Mother   . Hypertension Mother   . Arthritis Mother   . Stroke Father   . Hypertension Sister   . Diabetes Sister   . Seizures Brother   . Diabetes Brother     History  Substance Use Topics  . Smoking status: Former Smoker -- 0.30 packs/day for 22 years    Types: Cigarettes    Quit date: 05/01/2011  . Smokeless tobacco: Never Used  . Alcohol Use: Yes     Comment: rare    OB History   Grav Para Term Preterm Abortions TAB SAB Ect Mult Living                  Review of Systems  All other  systems reviewed and are negative.    Allergies  Review of patient's allergies indicates no known allergies.  Home Medications   Current Outpatient Rx  Name  Route  Sig  Dispense  Refill  . aspirin EC 325 MG tablet   Oral   Take 325 mg by mouth daily.         . Aspirin-Salicylamide-Caffeine (BC HEADACHE POWDER PO)   Oral   Take 2 packets by mouth daily as needed. pain         . benazepril-hydrochlorthiazide (LOTENSIN HCT) 20-25 MG per tablet   Oral   Take 1 tablet by mouth daily.         . budesonide-formoterol (SYMBICORT) 160-4.5 MCG/ACT inhaler   Inhalation   Inhale 2 puffs into the lungs 2 (two) times daily as needed. Shortness of breath         . cetirizine (ZYRTEC) 10 MG tablet   Oral   Take 10 mg by mouth daily as needed. allergies         .  diazepam (VALIUM) 5 MG tablet   Oral   Take 1 tablet (5 mg total) by mouth every 6 (six) hours as needed (spasm).   10 tablet   0   . diphenoxylate-atropine (LOMOTIL) 2.5-0.025 MG per tablet   Oral   Take 1 tablet by mouth 4 (four) times daily as needed for diarrhea or loose stools.   30 tablet   0   . fluticasone (FLONASE) 50 MCG/ACT nasal spray   Nasal   Place 2 sprays into the nose 2 (two) times daily as needed. For nasal congestion         . gabapentin (NEURONTIN) 300 MG capsule   Oral   Take 300 mg by mouth 3 (three) times daily.         Marland Kitchen ibuprofen (ADVIL,MOTRIN) 200 MG tablet   Oral   Take 400 mg by mouth every 6 (six) hours as needed. pain         . ibuprofen (ADVIL,MOTRIN) 800 MG tablet   Oral   Take 1 tablet (800 mg total) by mouth every 8 (eight) hours as needed for pain.   21 tablet   0   . insulin aspart (NOVOLOG) 100 UNIT/ML injection   Subcutaneous   Inject 15 Units into the skin at bedtime.         . metFORMIN (GLUCOPHAGE) 500 MG tablet   Oral   Take 500 mg by mouth 2 (two) times daily.          . metoCLOPramide (REGLAN) 10 MG tablet   Oral   Take 1 tablet (10 mg  total) by mouth every 6 (six) hours.   30 tablet   0   . montelukast (SINGULAIR) 10 MG tablet   Oral   Take 10 mg by mouth at bedtime.         . nortriptyline (PAMELOR) 10 MG capsule   Oral   Take 30 mg by mouth at bedtime.          Marland Kitchen EXPIRED: omeprazole (PRILOSEC) 20 MG capsule   Oral   Take 1 capsule (20 mg total) by mouth daily.   30 capsule   11   . OVER THE COUNTER MEDICATION   Oral   Take 1 packet by mouth daily.         Marland Kitchen oxyCODONE-acetaminophen (PERCOCET/ROXICET) 5-325 MG per tablet   Oral   Take 1 tablet by mouth every 4 (four) hours as needed for pain.   5 tablet   0   . oxyCODONE-acetaminophen (PERCOCET/ROXICET) 5-325 MG per tablet   Oral   Take 1 tablet by mouth every 4 (four) hours as needed for pain.   15 tablet   0   . penicillin v potassium (VEETID) 500 MG tablet   Oral   Take 1 tablet (500 mg total) by mouth 3 (three) times daily.   30 tablet   0   . sertraline (ZOLOFT) 25 MG tablet   Oral   Take 25 mg by mouth daily.           . simvastatin (ZOCOR) 10 MG tablet   Oral   Take 10 mg by mouth at bedtime.           . topiramate (TOPAMAX) 100 MG tablet   Oral   Take 100 mg by mouth at bedtime.             BP 144/104  Pulse 92  Temp(Src) 98.5 F (36.9 C) (Oral)  Resp 20  SpO2 99%  Physical Exam  Nursing note and vitals reviewed. Constitutional: She is oriented to person, place, and time. She appears well-developed and well-nourished. No distress.  HENT:  Head: Normocephalic and atraumatic.  Eyes: EOM are normal.  Neck: Normal range of motion.  Cardiovascular: Normal rate, regular rhythm and normal heart sounds.   Pulmonary/Chest: Effort normal and breath sounds normal.  Abdominal: Soft. She exhibits no distension. There is no tenderness.  Musculoskeletal: Normal range of motion.  Mild tenderness of right lumbar spine with mild spasm.  Mild tenderness of her right sciatic groove.  5 out of 5 strength in her bilateral lower  extremity major muscle groups.  Normal pulses in her right foot  Neurological: She is alert and oriented to person, place, and time.  Skin: Skin is warm and dry.  Psychiatric: She has a normal mood and affect. Judgment normal.    ED Course  Procedures (including critical care time)  Labs Reviewed - No data to display No results found.   1. Back pain       MDM  Normal lower extremity neurologic exam. No bowel or bladder complaints. No back pain red flags. Likely musculoskeletal back pain. Doubt spinal epidural abscess. Doubt cauda equina. Doubt abdominal aortic aneurysm.  Pain controlled PCP followup          Lyanne Co, MD 12/26/12 1207

## 2013-01-26 ENCOUNTER — Telehealth: Payer: Self-pay | Admitting: Emergency Medicine

## 2013-01-26 NOTE — Telephone Encounter (Signed)
Called pt x's 3 to make next ov per recall.  Pt never returned calls.  Mailed recall letter 01/26/13. Emily E McAlister °

## 2013-03-08 IMAGING — CR DG ABDOMEN ACUTE W/ 1V CHEST
5 series · 5 of 5 positions shown · non-contrast
Comparison: Chest x-ray 02/24/2012.

CLINICAL DATA: Nausea and vomiting.

ACUTE ABDOMEN SERIES (ABDOMEN 2 VIEW & CHEST 1 VIEW)

[w chest pa]
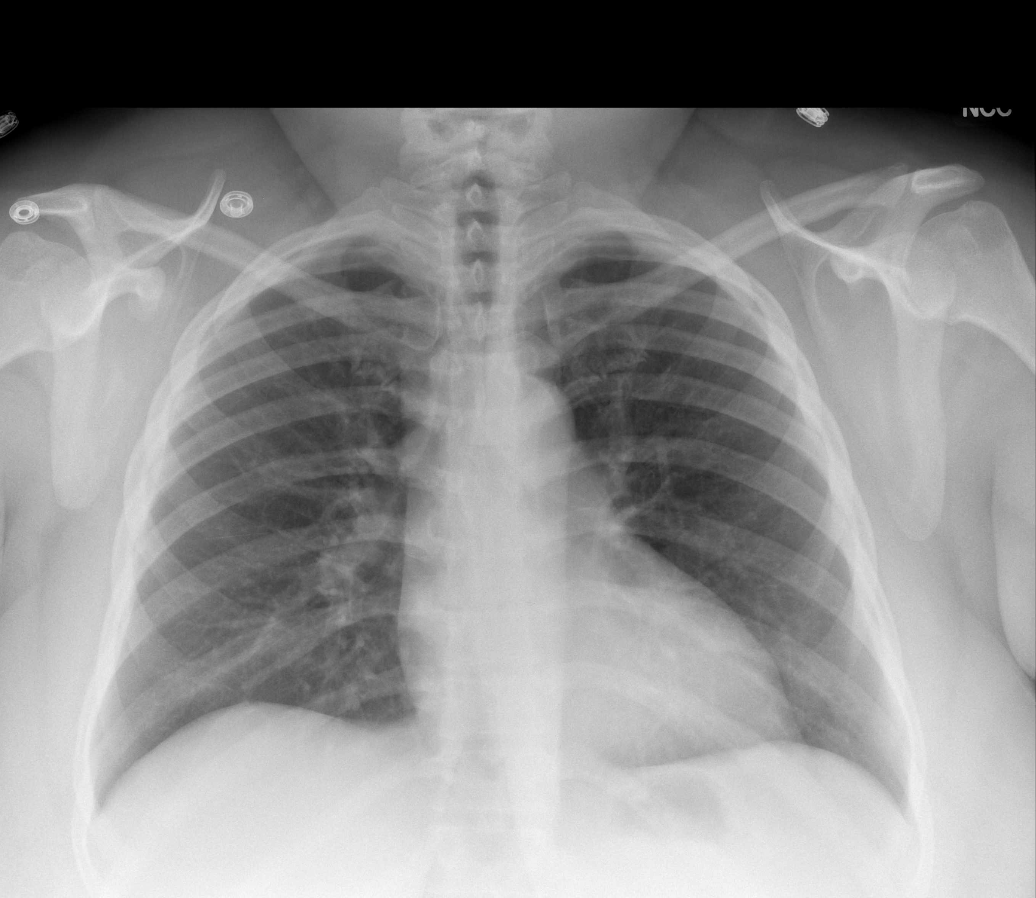

[w abdomen upright]
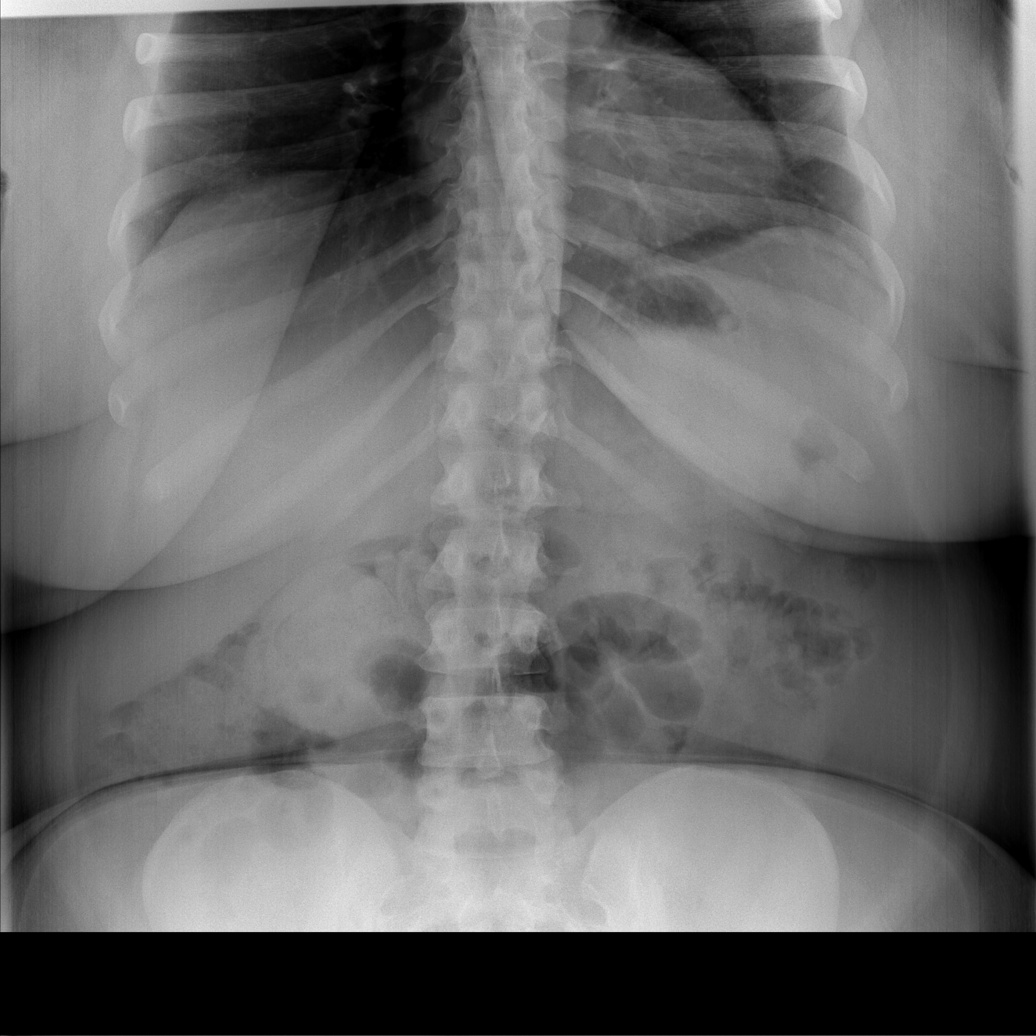

[t abdomen supine * (1 of 2)]
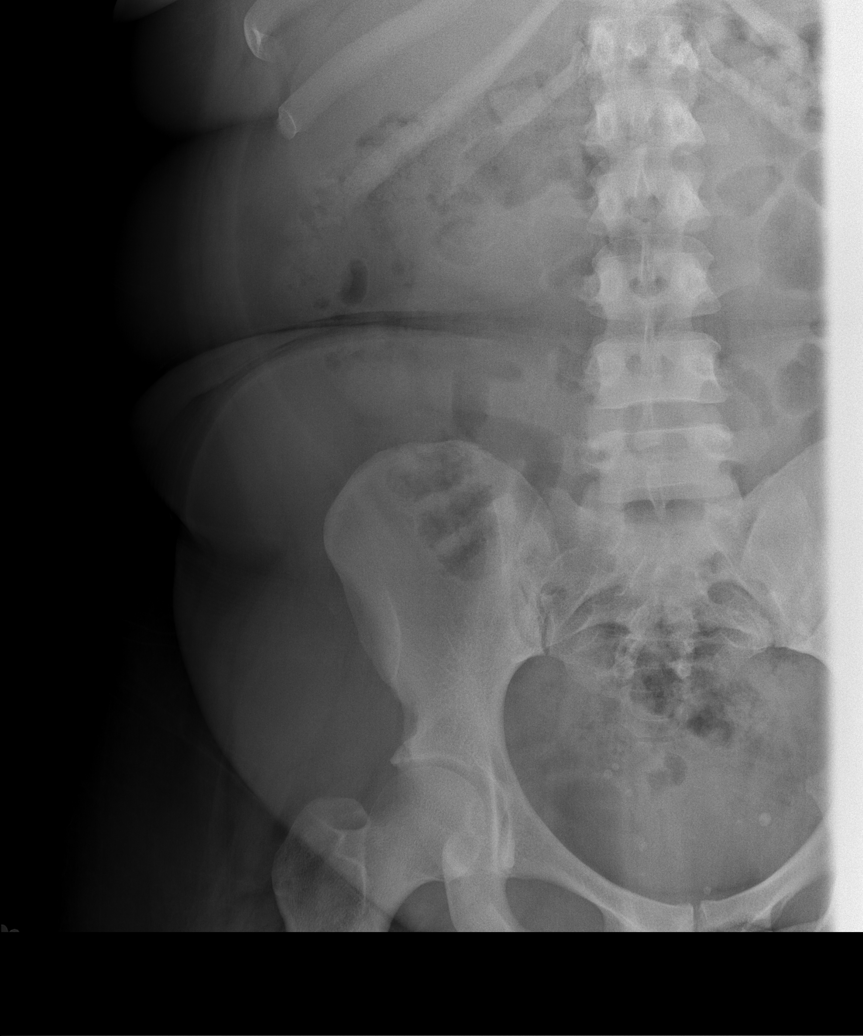

[t abdomen supine]
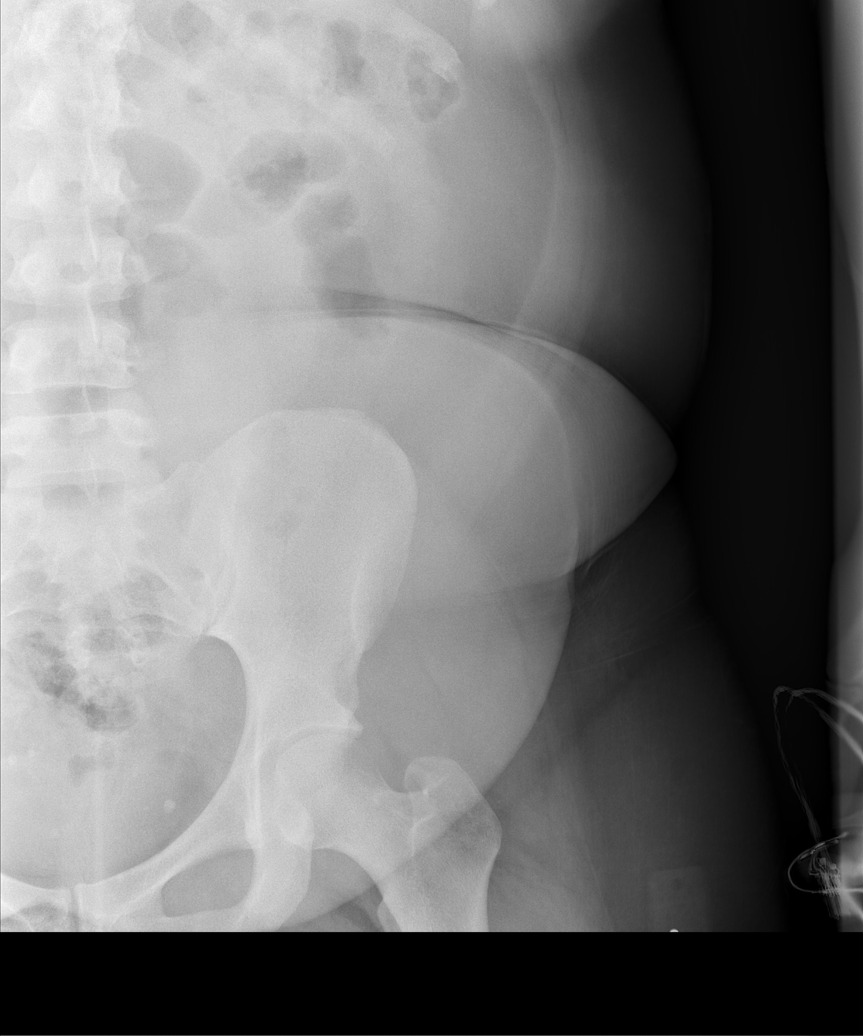

[t abdomen supine * (2 of 2)]
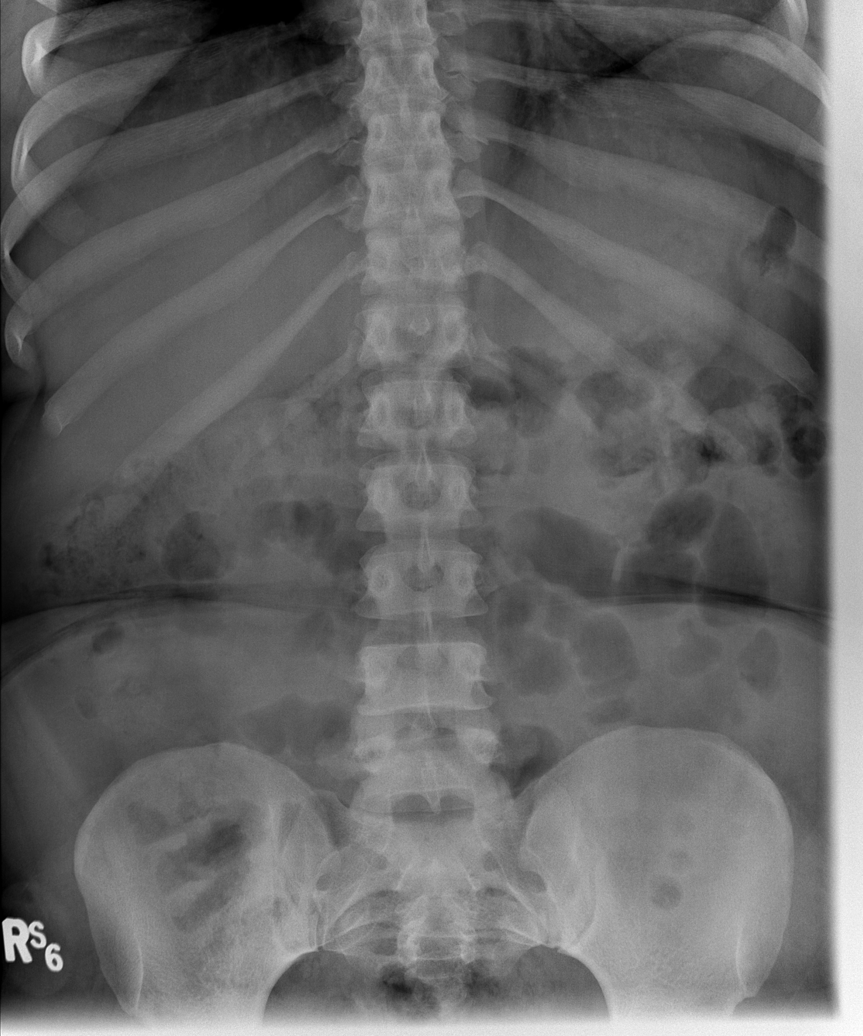

[5 of 5 positions shown; findings below may reference images not displayed]

FINDINGS: Lung volumes are normal.  No consolidative airspace
disease.  No pleural effusions.  No pneumothorax.  No pulmonary
nodule or mass noted.  Pulmonary vasculature and the
cardiomediastinal silhouette are within normal limits.

Supine and upright views of the abdomen demonstrate gas and stool
scattered throughout the colon extending to the level of the distal
rectum.  There are several nondilated loops of gas-filled small
bowel scattered throughout the abdomen measuring up to
approximately 2.8 cm in diameter.  No pneumoperitoneum.  Multiple
pelvic phleboliths are noted bilaterally.
IMPRESSION: 1.  Nonspecific, nonobstructive bowel gas pattern.
2.  No pneumoperitoneum.
3.  No radiographic evidence of acute cardiopulmonary disease.

## 2013-03-10 ENCOUNTER — Ambulatory Visit: Payer: Self-pay | Admitting: Emergency Medicine

## 2013-03-18 ENCOUNTER — Emergency Department (HOSPITAL_COMMUNITY): Payer: Self-pay

## 2013-03-18 ENCOUNTER — Encounter (HOSPITAL_COMMUNITY): Payer: Self-pay | Admitting: Emergency Medicine

## 2013-03-18 ENCOUNTER — Emergency Department (HOSPITAL_COMMUNITY)
Admission: EM | Admit: 2013-03-18 | Discharge: 2013-03-18 | Disposition: A | Discharge status: Home / Self Care | Payer: Self-pay | Attending: Emergency Medicine | Admitting: Emergency Medicine

## 2013-03-18 DIAGNOSIS — Z794 Long term (current) use of insulin: Secondary | ICD-10-CM | POA: Insufficient documentation

## 2013-03-18 DIAGNOSIS — Z8639 Personal history of other endocrine, nutritional and metabolic disease: Secondary | ICD-10-CM | POA: Insufficient documentation

## 2013-03-18 DIAGNOSIS — J40 Bronchitis, not specified as acute or chronic: Secondary | ICD-10-CM

## 2013-03-18 DIAGNOSIS — R5381 Other malaise: Secondary | ICD-10-CM | POA: Insufficient documentation

## 2013-03-18 DIAGNOSIS — J029 Acute pharyngitis, unspecified: Secondary | ICD-10-CM | POA: Insufficient documentation

## 2013-03-18 DIAGNOSIS — Z862 Personal history of diseases of the blood and blood-forming organs and certain disorders involving the immune mechanism: Secondary | ICD-10-CM | POA: Insufficient documentation

## 2013-03-18 DIAGNOSIS — R071 Chest pain on breathing: Secondary | ICD-10-CM | POA: Insufficient documentation

## 2013-03-18 DIAGNOSIS — R059 Cough, unspecified: Secondary | ICD-10-CM | POA: Insufficient documentation

## 2013-03-18 DIAGNOSIS — Z87891 Personal history of nicotine dependence: Secondary | ICD-10-CM | POA: Insufficient documentation

## 2013-03-18 DIAGNOSIS — R6883 Chills (without fever): Secondary | ICD-10-CM | POA: Insufficient documentation

## 2013-03-18 DIAGNOSIS — I10 Essential (primary) hypertension: Secondary | ICD-10-CM | POA: Insufficient documentation

## 2013-03-18 DIAGNOSIS — Z79899 Other long term (current) drug therapy: Secondary | ICD-10-CM | POA: Insufficient documentation

## 2013-03-18 DIAGNOSIS — R0789 Other chest pain: Secondary | ICD-10-CM

## 2013-03-18 DIAGNOSIS — J45901 Unspecified asthma with (acute) exacerbation: Secondary | ICD-10-CM | POA: Insufficient documentation

## 2013-03-18 DIAGNOSIS — R05 Cough: Secondary | ICD-10-CM | POA: Insufficient documentation

## 2013-03-18 DIAGNOSIS — Z9089 Acquired absence of other organs: Secondary | ICD-10-CM | POA: Insufficient documentation

## 2013-03-18 DIAGNOSIS — E119 Type 2 diabetes mellitus without complications: Secondary | ICD-10-CM

## 2013-03-18 MED ORDER — PREDNISONE 20 MG PO TABS: 40.0000 mg | ORAL_TABLET | Freq: Every day | ORAL | Status: DC

## 2013-03-18 MED ORDER — HYDROCOD POLST-CHLORPHEN POLST 10-8 MG/5ML PO LQCR
5.0000 mL | Freq: Once | ORAL | Status: AC
  Administered 2013-03-18: 5 mL via ORAL
  Filled 2013-03-18: qty 5

## 2013-03-18 MED ORDER — ALBUTEROL SULFATE (2.5 MG/3ML) 0.083% IN NEBU
2.5000 mg | INHALATION_SOLUTION | Freq: Four times a day (QID) | RESPIRATORY_TRACT | Status: DC | PRN
Start: 2013-03-18 — End: 2013-04-28

## 2013-03-18 MED ORDER — HYDROCOD POLST-CHLORPHEN POLST 10-8 MG/5ML PO LQCR: 5.0000 mL | Freq: Two times a day (BID) | ORAL | Status: DC | PRN

## 2013-03-18 MED ORDER — ALBUTEROL SULFATE (5 MG/ML) 0.5% IN NEBU
5.0000 mg | INHALATION_SOLUTION | Freq: Once | RESPIRATORY_TRACT | Status: AC
  Administered 2013-03-18: 5 mg via RESPIRATORY_TRACT
  Filled 2013-03-18: qty 1

## 2013-03-18 MED ORDER — PREDNISONE 20 MG PO TABS
60.0000 mg | ORAL_TABLET | Freq: Once | ORAL | Status: AC
  Administered 2013-03-18: 60 mg via ORAL
  Filled 2013-03-18: qty 3

## 2013-03-18 NOTE — Discharge Instructions (Signed)
Bronchitis  Bronchitis is the body's way of reacting to injury and/or infection (inflammation) of the bronchi. Bronchi are the air tubes that extend from the windpipe into the lungs. If the inflammation becomes severe, it may cause shortness of breath.  CAUSES   Inflammation may be caused by:   A virus.   Germs (bacteria).   Dust.   Allergens.   Pollutants and many other irritants.  The cells lining the bronchial tree are covered with tiny hairs (cilia). These constantly beat upward, away from the lungs, toward the mouth. This keeps the lungs free of pollutants. When these cells become too irritated and are unable to do their job, mucus begins to develop. This causes the characteristic cough of bronchitis. The cough clears the lungs when the cilia are unable to do their job. Without either of these protective mechanisms, the mucus would settle in the lungs. Then you would develop pneumonia.  Smoking is a common cause of bronchitis and can contribute to pneumonia. Stopping this habit is the single most important thing you can do to help yourself.  TREATMENT    Your caregiver may prescribe an antibiotic if the cough is caused by bacteria. Also, medicines that open up your airways make it easier to breathe. Your caregiver may also recommend or prescribe an expectorant. It will loosen the mucus to be coughed up. Only take over-the-counter or prescription medicines for pain, discomfort, or fever as directed by your caregiver.   Removing whatever causes the problem (smoking, for example) is critical to preventing the problem from getting worse.   Cough suppressants may be prescribed for relief of cough symptoms.   Inhaled medicines may be prescribed to help with symptoms now and to help prevent problems from returning.   For those with recurrent (chronic) bronchitis, there may be a need for steroid medicines.  SEEK IMMEDIATE MEDICAL CARE IF:    During treatment, you develop more pus-like mucus (purulent  sputum).   You have a fever.   Your baby is older than 3 months with a rectal temperature of 102 F (38.9 C) or higher.   Your baby is 67 months old or younger with a rectal temperature of 100.4 F (38 C) or higher.   You become progressively more ill.   You have increased difficulty breathing, wheezing, or shortness of breath.  It is necessary to seek immediate medical care if you are elderly or sick from any other disease.  MAKE SURE YOU:    Understand these instructions.   Will watch your condition.   Will get help right away if you are not doing well or get worse.  Document Released: 09/08/2005 Document Revised: 12/01/2011 Document Reviewed: 07/18/2008  [facility] Patient Information 2014 [place].      Chest Wall Pain  Chest wall pain is pain in or around the bones and muscles of your chest. It may take up to 6 weeks to get better. It may take longer if you must stay physically active in your work and activities.   CAUSES   Chest wall pain may happen on its own. However, it may be caused by:   A viral illness like the flu.   Injury.   Coughing.   Exercise.   Arthritis.   Fibromyalgia.   Shingles.  HOME CARE INSTRUCTIONS    Avoid overtiring physical activity. Try not to strain or perform activities that cause pain. This includes any activities using your chest or your abdominal and side muscles, especially if heavy  weights are used.   Put ice on the sore area.   Put ice in a plastic bag.   Place a towel between your skin and the bag.   Leave the ice on for 15-20 minutes per hour while awake for the first 2 days.   Only take over-the-counter or prescription medicines for pain, discomfort, or fever as directed by your caregiver.  SEEK IMMEDIATE MEDICAL CARE IF:    Your pain increases, or you are very uncomfortable.   You have a fever.   Your chest pain becomes worse.   You have new, unexplained symptoms.   You have nausea or vomiting.   You feel sweaty or lightheaded.   You have a  cough with phlegm (sputum), or you cough up blood.  MAKE SURE YOU:    Understand these instructions.   Will watch your condition.   Will get help right away if you are not doing well or get worse.  Document Released: 09/08/2005 Document Revised: 12/01/2011 Document Reviewed: 05/05/2011  [facility] Patient Information 2014 [place], [name].

## 2013-03-18 NOTE — ED Provider Notes (Signed)
History    CSN: [id number] Arrival date & time 03/18/13  2046  First MD Initiated Contact with Patient 03/18/13 2117     Chief Complaint  Patient presents with  . Wheezing   (Consider location/radiation/quality/duration/timing/severity/associated sxs/prior Treatment) Patient is a 42 y.o. female presenting with wheezing. The history is provided by the patient.  Wheezing Severity:  Severe Severity compared to prior episodes:  Similar Onset quality:  Gradual Duration:  3 days Timing:  Constant Progression:  Worsening Chronicity:  New Relieved by:  Beta-agonist inhaler Worsened by:  Nothing tried Ineffective treatments:  Beta-agonist inhaler Associated symptoms: chest pain, chest tightness, cough, fatigue and sore throat   Associated symptoms: no rash   Associated symptoms comment:  Chills Chest pain:    Quality:  Aching and burning   Severity:  Moderate   Onset quality:  Gradual   Duration:  1 day   Timing:  Constant   Progression:  Worsening (Worsens with coughing)   Chronicity:  New Cough:    Cough characteristics:  Non-productive and hacking   Sputum characteristics:  Clear   Severity:  Severe   Onset quality:  Gradual   Duration:  3 days Sore throat:    Severity:  Moderate   Onset quality:  Gradual   Duration:  2 days Risk factors: prior hospitalizations   Risk factors: no prior intubations and no suspected foreign body    Past Medical History  Diagnosis Date  . DM (diabetes mellitus)   . Hypertension   . Hypokalemia   . Asthma    Past Surgical History  Procedure Laterality Date  . Cesarean section      x 3  . Appendectomy    . Vesicovaginal fistula closure w/ tah  2009  . Hernia repair     Family History  Problem Relation Age of Onset  . Heart attack Mother   . Stroke Mother   . Diabetes Mother   . Hypertension Mother   . Arthritis Mother   . Stroke Father   . Hypertension Sister   . Diabetes Sister   . Seizures Brother   . Diabetes  Brother    History  Substance Use Topics  . Smoking status: Former Smoker -- 0.30 packs/day for 22 years    Types: Cigarettes    Quit date: 05/01/2011  . Smokeless tobacco: Never Used  . Alcohol Use: Yes     Comment: rare   OB History   Grav Para Term Preterm Abortions TAB SAB Ect Mult Living                 Review of Systems  Constitutional: Positive for chills and fatigue.  HENT: Positive for sore throat.   Respiratory: Positive for cough, chest tightness and wheezing.   Cardiovascular: Positive for chest pain.  Skin: Negative for rash.  All other systems reviewed and are negative.    Allergies  Review of patient's allergies indicates no known allergies.  Home Medications   Current Outpatient Rx  Name  Route  Sig  Dispense  Refill  . acetaminophen (TYLENOL) 500 MG tablet   Oral   Take 1,000 mg by mouth every 6 (six) hours as needed for pain.         Marland Kitchen albuterol (PROVENTIL) (2.5 MG/3ML) 0.083% nebulizer solution   Nebulization   Take 3 mLs (2.5 mg total) by nebulization every 6 (six) hours as needed for wheezing.   30 vial   0   . chlorpheniramine-HYDROcodone (TUSSIONEX PENNKINETIC  ER) 10-8 MG/5ML LQCR   Oral   Take 5 mLs by mouth every 12 (twelve) hours as needed.   80 mL   0   . predniSONE (DELTASONE) 20 MG tablet   Oral   Take 2 tablets (40 mg total) by mouth daily.   10 tablet   0    BP 169/102  Temp(Src) 97.9 F (36.6 C) (Oral)  Resp 28  SpO2 96% Physical Exam  Nursing note and vitals reviewed. Constitutional: She is oriented to person, place, and time. She appears well-developed and well-nourished. No distress.  HENT:  Head: Normocephalic and atraumatic.  Eyes: EOM are normal. No scleral icterus.  Neck: Neck supple.  Cardiovascular: Normal rate and intact distal pulses.   Pulmonary/Chest: No accessory muscle usage. Tachypnea noted. No apnea. No respiratory distress. She has wheezes. She has rhonchi. She has rales. She exhibits tenderness.  She exhibits no crepitus.  Abdominal: She exhibits no distension. There is no tenderness.  Neurological: She is alert and oriented to person, place, and time. She exhibits normal muscle tone. Coordination normal.  Skin: Skin is warm and dry. She is not diaphoretic.  Psychiatric: She has a normal mood and affect.    ED Course  Procedures (including critical care time) Labs Reviewed  GLUCOSE, CAPILLARY - Abnormal; Notable for the following:    Glucose-Capillary 262 (*)    All other components within normal limits   Dg Chest 2 View  03/18/2013   *RADIOLOGY REPORT*  Clinical Data: Cough.  Asthma.  Bronchitis.  Fever.  Chills.  CHEST - 2 VIEW  Comparison: 08/08/2012 acute abdomen series  Findings: Midline trachea.  Normal heart size and mediastinal contours.  No pleural effusion or pneumothorax.  Clear lungs.  IMPRESSION: No acute cardiopulmonary disease.   Original Report Authenticated By: Jeronimo Greaves, M.D.   1. Bronchitis   2. Chest wall pain   3. Diabetes    ra sat is 96% and I interpret to be normal   MDM  Pt with h/o prior hospitalization, pneumonia in the past.  Pt endorses chills, no fever here today. Is out of nebulizer fluid, has been using rescue inhaler alone with no sig improvement. Pt used to see Dr. Petra Kuba, but has no insurance so can't get into to see him.  Due to CP with coughing, sore throat with coughing, has been unable to sleep.  Will get CXR, give repeat nebs, oral steroids.  RT to see pt in the ED.  Will need tussionex for cough and pain as well.  Will refer to [facility].       Gavin Pound. Alaysiah Browder, MD 03/18/13 2324

## 2013-03-18 NOTE — ED Notes (Signed)
PT. REPORTS WHEEZING / ASTHMA ONSET 2 DAYS AGO UNRELIEVED BY [facility] .

## 2013-03-22 ENCOUNTER — Ambulatory Visit: Payer: Self-pay

## 2013-04-20 ENCOUNTER — Ambulatory Visit: Payer: Self-pay | Admitting: Emergency Medicine

## 2013-04-24 IMAGING — CT CT MAXILLOFACIAL W/ CM
3 of 4 series · 16 of 47 positions shown, 19 images · non-contrast
Comparison: Head CT dated 10/25/2005.

CT HEAD

CLINICAL DATA: Headache.

CT HEAD WITHOUT CONTRAST
CT MAXILLOFACIAL WITHOUT CONTRAST
TECHNIQUE: Multidetector CT imaging of the head and maxillofacial
structures were performed using the standard protocol without
intravenous contrast. Multiplanar CT image reconstructions of the
maxillofacial structures were also generated.

[Series 4: orbit/facial 2.0 h30s · axial · 0.39mm/px · z∈[+1040,+1184]mm · 10 of 85 slices shown, 13 images]
[im 9/85  brain]
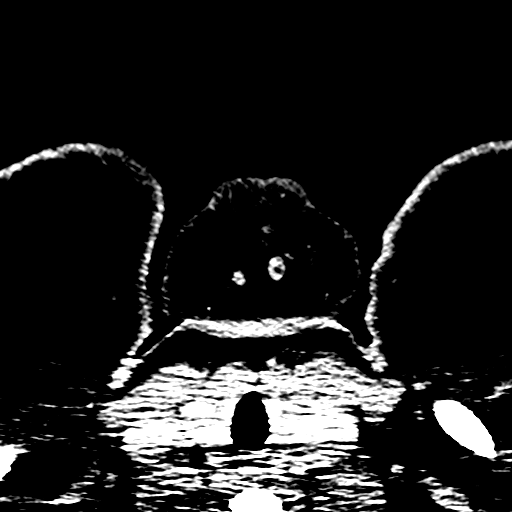
[im 9/85  bone]
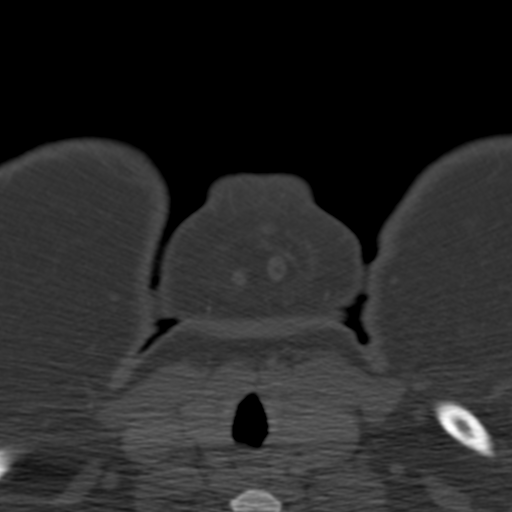
[im 17/85  bone]
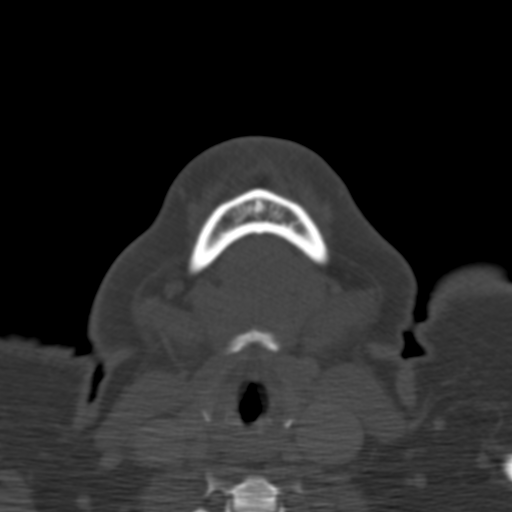
[im 25/85  bone]
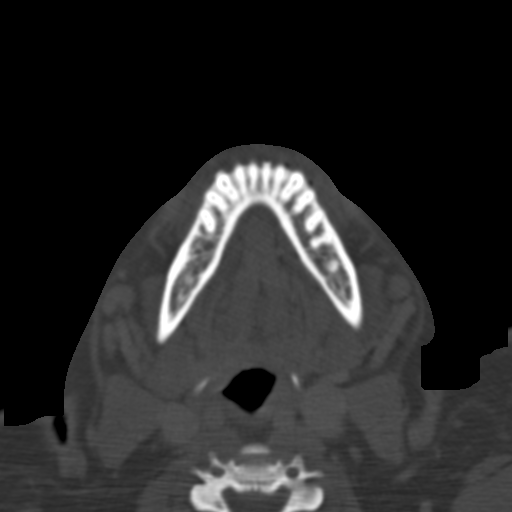
[im 33/85  bone]
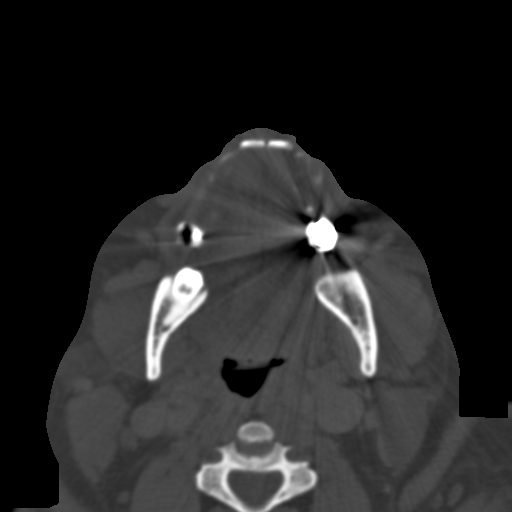
[im 41/85  brain]
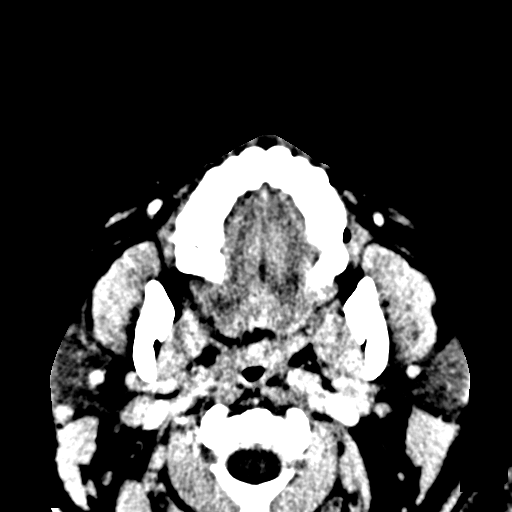
[im 41/85  bone]
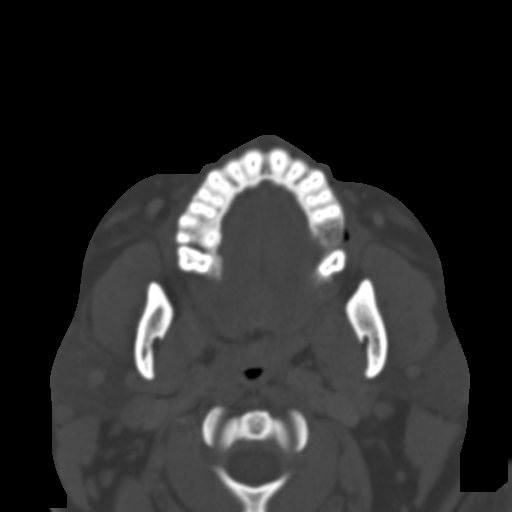
[im 49/85  bone]
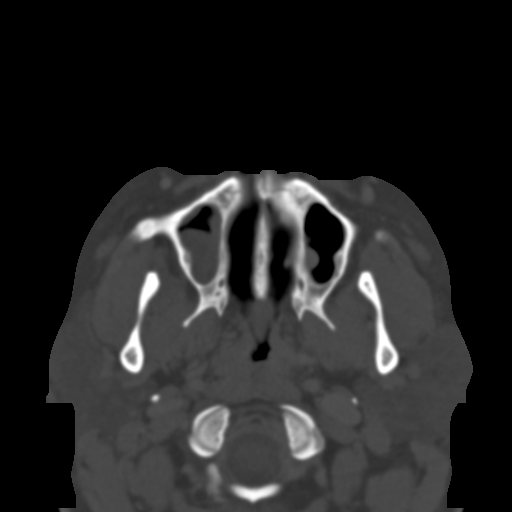
[im 57/85  bone]
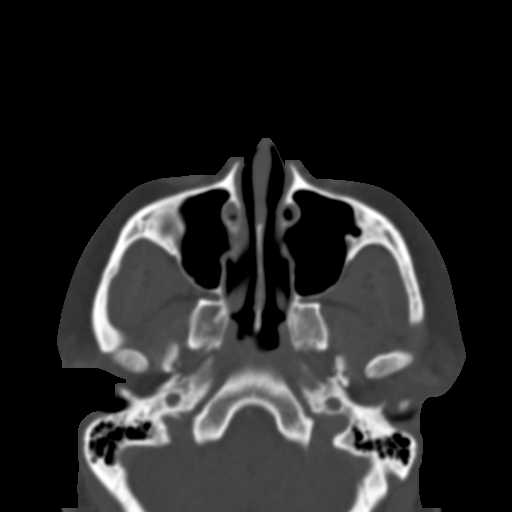
[im 65/85  bone]
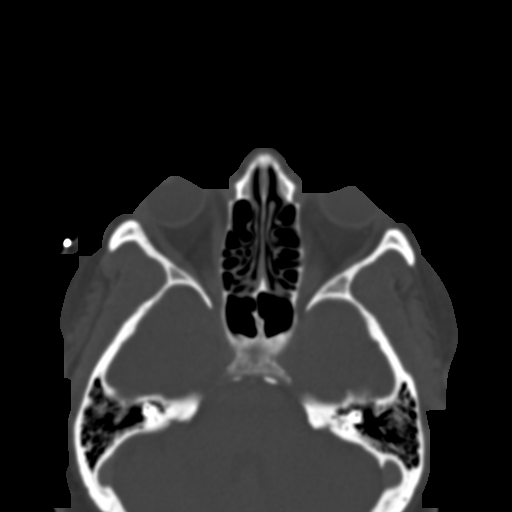
[im 73/85  brain]
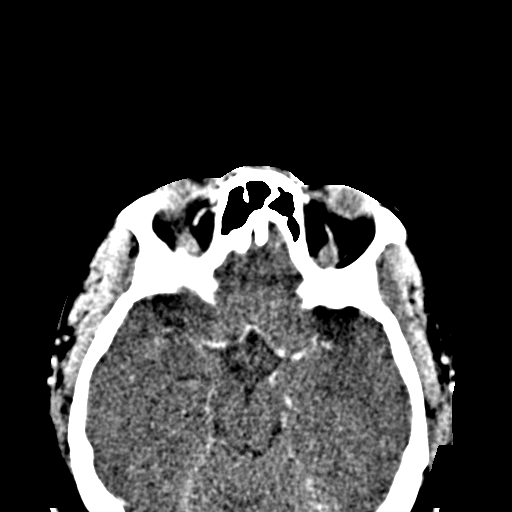
[im 73/85  bone]
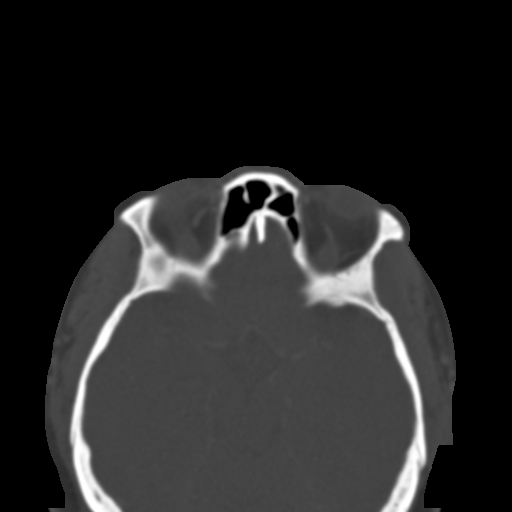
[im 81/85  bone]
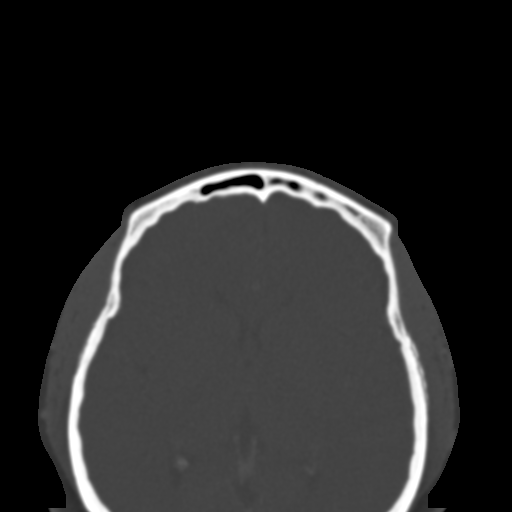

[Series 8: coronals · coronal · 0.35mm/px · 3 of 75 slices shown]
[im 25/75  bone]
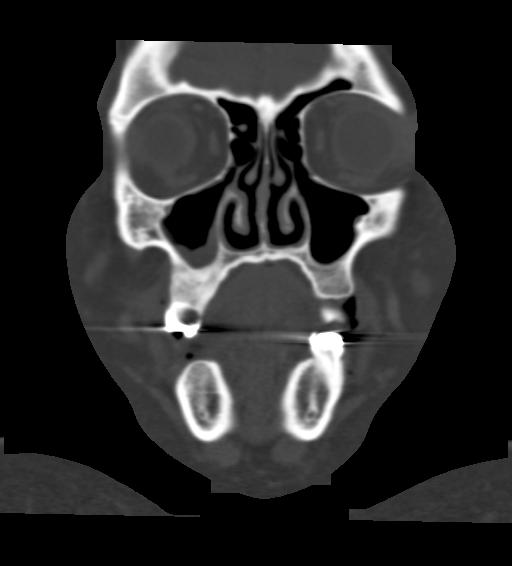
[im 33/75  bone]
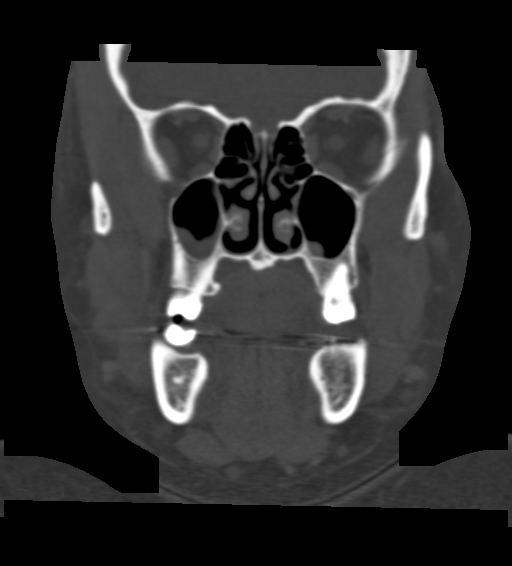
[im 42/75  bone]
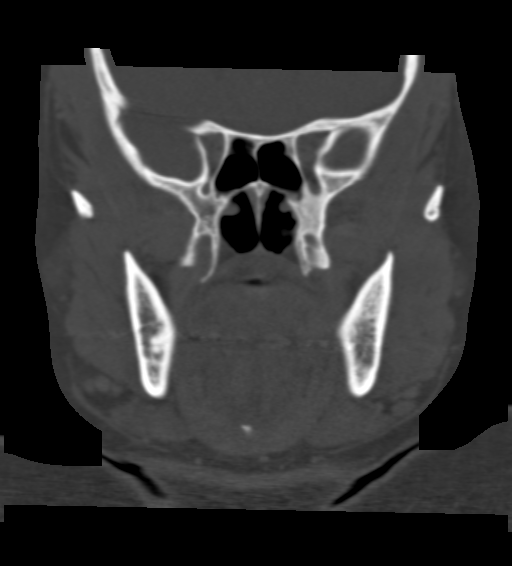

[Series 9: sagittals · sagittal · 0.33mm/px · 3 of 99 slices shown]
[im 33/99  bone]
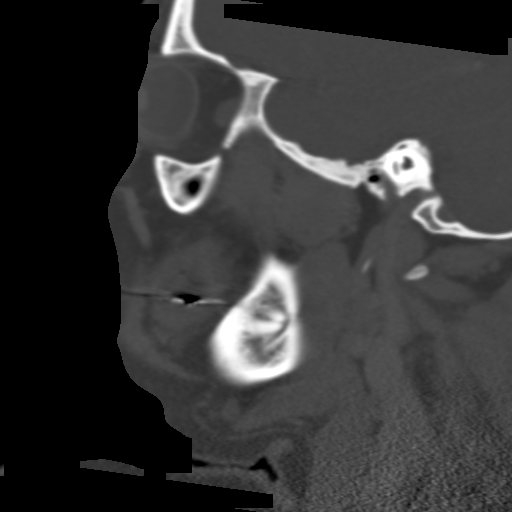
[im 50/99  bone]
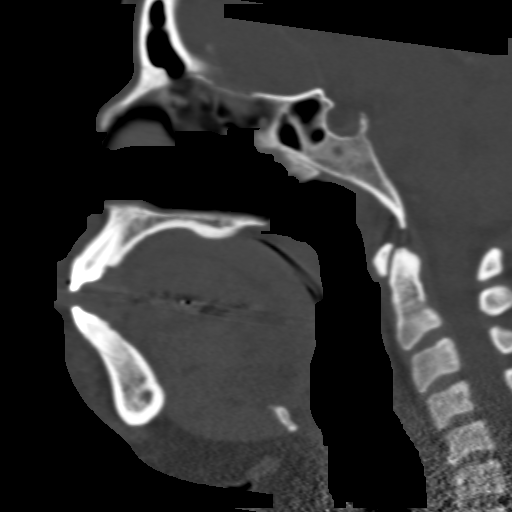
[im 66/99  bone]
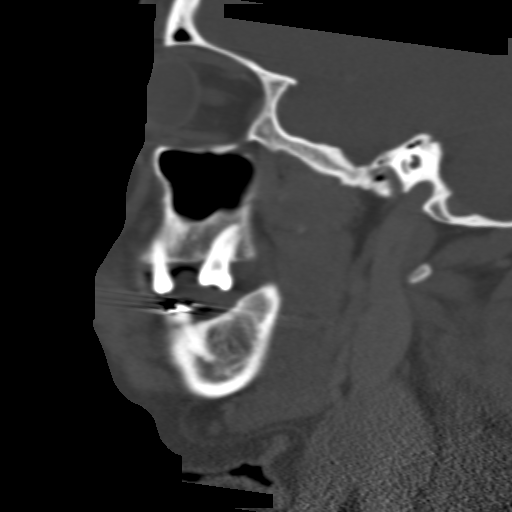

[16 of 47 positions shown; findings below may reference images not displayed]

FINDINGS: Normal appearing cerebral hemispheres and posterior fossa
structures.  Normal size and position of the ventricles.
Unremarkable bones and included paranasal sinuses.
IMPRESSION: Normal examination.

CT MAXILLOFACIAL
FINDINGS: Inferior right maxillary sinus mucosal thickening with
a maximum thickness of 6 mm.  Small left maxillary sinus retention
cysts.  The facial bones have normal appearances.  Unremarkable
upper cervical spine.
IMPRESSION: Chronic right maxillary sinusitis.

## 2013-04-27 ENCOUNTER — Inpatient Hospital Stay (HOSPITAL_COMMUNITY)
Admission: EM | Admit: 2013-04-27 | Discharge: 2013-05-01 | DRG: 202 | Disposition: A | Discharge status: Home / Self Care | Payer: Medicare Other | Attending: Internal Medicine | Admitting: Internal Medicine

## 2013-04-27 ENCOUNTER — Encounter (HOSPITAL_COMMUNITY): Payer: Self-pay | Admitting: Emergency Medicine

## 2013-04-27 ENCOUNTER — Emergency Department (HOSPITAL_COMMUNITY): Payer: Medicare Other

## 2013-04-27 DIAGNOSIS — E669 Obesity, unspecified: Secondary | ICD-10-CM

## 2013-04-27 DIAGNOSIS — R112 Nausea with vomiting, unspecified: Secondary | ICD-10-CM | POA: Diagnosis present

## 2013-04-27 DIAGNOSIS — IMO0002 Reserved for concepts with insufficient information to code with codable children: Secondary | ICD-10-CM

## 2013-04-27 DIAGNOSIS — E1165 Type 2 diabetes mellitus with hyperglycemia: Secondary | ICD-10-CM

## 2013-04-27 DIAGNOSIS — I10 Essential (primary) hypertension: Secondary | ICD-10-CM | POA: Diagnosis present

## 2013-04-27 DIAGNOSIS — R197 Diarrhea, unspecified: Secondary | ICD-10-CM | POA: Diagnosis present

## 2013-04-27 DIAGNOSIS — D72829 Elevated white blood cell count, unspecified: Secondary | ICD-10-CM | POA: Diagnosis present

## 2013-04-27 DIAGNOSIS — Z6841 Body Mass Index (BMI) 40.0 and over, adult: Secondary | ICD-10-CM | POA: Diagnosis not present

## 2013-04-27 DIAGNOSIS — A088 Other specified intestinal infections: Secondary | ICD-10-CM | POA: Diagnosis present

## 2013-04-27 DIAGNOSIS — J9600 Acute respiratory failure, unspecified whether with hypoxia or hypercapnia: Secondary | ICD-10-CM | POA: Diagnosis present

## 2013-04-27 DIAGNOSIS — G43909 Migraine, unspecified, not intractable, without status migrainosus: Secondary | ICD-10-CM | POA: Diagnosis present

## 2013-04-27 DIAGNOSIS — E872 Acidosis, unspecified: Secondary | ICD-10-CM | POA: Diagnosis present

## 2013-04-27 DIAGNOSIS — J45901 Unspecified asthma with (acute) exacerbation: Secondary | ICD-10-CM | POA: Diagnosis present

## 2013-04-27 DIAGNOSIS — G4733 Obstructive sleep apnea (adult) (pediatric): Secondary | ICD-10-CM | POA: Diagnosis present

## 2013-04-27 DIAGNOSIS — IMO0001 Reserved for inherently not codable concepts without codable children: Secondary | ICD-10-CM | POA: Diagnosis present

## 2013-04-27 DIAGNOSIS — J209 Acute bronchitis, unspecified: Secondary | ICD-10-CM | POA: Diagnosis present

## 2013-04-27 DIAGNOSIS — A084 Viral intestinal infection, unspecified: Secondary | ICD-10-CM | POA: Diagnosis present

## 2013-04-27 DIAGNOSIS — Z87891 Personal history of nicotine dependence: Secondary | ICD-10-CM

## 2013-04-27 DIAGNOSIS — J9601 Acute respiratory failure with hypoxia: Secondary | ICD-10-CM

## 2013-04-27 LAB — BASIC METABOLIC PANEL
BUN: 9 mg/dL (ref 6–23)
Chloride: 102 mEq/L (ref 96–112)
GFR calc Af Amer: >90 mL/min (ref >90)
Glucose, Bld: 213 mg/dL — ABNORMAL HIGH (ref 70–99)
Potassium: 3.6 mEq/L (ref 3.5–5.1)
Sodium: 136 mEq/L (ref 135–145)

## 2013-04-27 LAB — CBC
HCT: 38.4 % (ref 36.0–46.0)
Hemoglobin: 13.8 g/dL (ref 12.0–15.0)
MCH: 30.7 pg (ref 26.0–34.0)
MCHC: 35.9 g/dL (ref 30.0–36.0)
RBC: 4.5 MIL/uL (ref 3.87–5.11)

## 2013-04-27 LAB — GLUCOSE, CAPILLARY: Glucose-Capillary: 380 mg/dL — ABNORMAL HIGH (ref 70–99)

## 2013-04-27 LAB — HEMOGLOBIN A1C: Hgb A1c MFr Bld: 8.9 % — ABNORMAL HIGH (ref <5.7)

## 2013-04-27 LAB — POCT I-STAT TROPONIN I: Troponin i, poc: 0 ng/mL (ref 0.00–0.08)

## 2013-04-27 MED ORDER — ACETAMINOPHEN 650 MG RE SUPP: 650.0000 mg | Freq: Four times a day (QID) | RECTAL | Status: DC | PRN

## 2013-04-27 MED ORDER — LEVALBUTEROL HCL 0.63 MG/3ML IN NEBU: 0.6300 mg | INHALATION_SOLUTION | RESPIRATORY_TRACT | Status: DC

## 2013-04-27 MED ORDER — MORPHINE SULFATE 2 MG/ML IJ SOLN
2.0000 mg | Freq: Once | INTRAMUSCULAR | Status: AC
  Administered 2013-04-27: 2 mg via INTRAVENOUS
  Filled 2013-04-27: qty 1

## 2013-04-27 MED ORDER — PNEUMOCOCCAL VAC POLYVALENT 25 MCG/0.5ML IJ INJ
0.5000 mL | INJECTION | INTRAMUSCULAR | Status: AC
  Administered 2013-04-29: 0.5 mL via INTRAMUSCULAR
  Filled 2013-04-27 (×2): qty 0.5

## 2013-04-27 MED ORDER — METOCLOPRAMIDE HCL 5 MG/ML IJ SOLN
5.0000 mg | Freq: Four times a day (QID) | INTRAMUSCULAR | Status: DC | PRN
  Filled 2013-04-27: qty 1

## 2013-04-27 MED ORDER — HYDROCODONE-ACETAMINOPHEN 5-325 MG PO TABS: 2.0000 | ORAL_TABLET | Freq: Four times a day (QID) | ORAL | Status: DC | PRN

## 2013-04-27 MED ORDER — ALBUTEROL SULFATE (5 MG/ML) 0.5% IN NEBU: 5.0000 mg | INHALATION_SOLUTION | Freq: Once | RESPIRATORY_TRACT | Status: AC

## 2013-04-27 MED ORDER — LEVALBUTEROL HCL 0.63 MG/3ML IN NEBU
0.6300 mg | INHALATION_SOLUTION | RESPIRATORY_TRACT | Status: DC
  Administered 2013-04-27 – 2013-04-29 (×12): 0.63 mg via RESPIRATORY_TRACT
  Filled 2013-04-27 (×19): qty 3

## 2013-04-27 MED ORDER — GUAIFENESIN-DM 100-10 MG/5ML PO SYRP
5.0000 mL | ORAL_SOLUTION | ORAL | Status: DC | PRN
  Administered 2013-04-27 – 2013-04-29 (×5): 5 mL via ORAL
  Filled 2013-04-27 (×6): qty 5

## 2013-04-27 MED ORDER — METHYLPREDNISOLONE SODIUM SUCC 125 MG IJ SOLR
125.0000 mg | Freq: Once | INTRAMUSCULAR | Status: AC
  Administered 2013-04-27: 125 mg via INTRAVENOUS
  Filled 2013-04-27: qty 2

## 2013-04-27 MED ORDER — LEVOFLOXACIN 750 MG PO TABS
750.0000 mg | ORAL_TABLET | Freq: Every day | ORAL | Status: DC
  Administered 2013-04-27 – 2013-05-01 (×5): 750 mg via ORAL
  Filled 2013-04-27 (×5): qty 1

## 2013-04-27 MED ORDER — INSULIN GLARGINE 100 UNIT/ML ~~LOC~~ SOLN
20.0000 [IU] | Freq: Every day | SUBCUTANEOUS | Status: DC
  Administered 2013-04-27 – 2013-04-29 (×3): 20 [IU] via SUBCUTANEOUS
  Filled 2013-04-27 (×4): qty 0.2

## 2013-04-27 MED ORDER — ALBUTEROL (5 MG/ML) CONTINUOUS INHALATION SOLN
INHALATION_SOLUTION | RESPIRATORY_TRACT | Status: AC
  Administered 2013-04-27: 10 mg/h via RESPIRATORY_TRACT
  Filled 2013-04-27: qty 20

## 2013-04-27 MED ORDER — ONDANSETRON HCL 4 MG PO TABS: 4.0000 mg | ORAL_TABLET | Freq: Four times a day (QID) | ORAL | Status: DC | PRN

## 2013-04-27 MED ORDER — ALBUTEROL SULFATE (5 MG/ML) 0.5% IN NEBU
INHALATION_SOLUTION | RESPIRATORY_TRACT | Status: AC
  Administered 2013-04-27: 5 mg via RESPIRATORY_TRACT
  Filled 2013-04-27: qty 1

## 2013-04-27 MED ORDER — ALBUTEROL (5 MG/ML) CONTINUOUS INHALATION SOLN
10.0000 mg/h | INHALATION_SOLUTION | Freq: Once | RESPIRATORY_TRACT | Status: AC
  Administered 2013-04-27: 10 mg/h via RESPIRATORY_TRACT

## 2013-04-27 MED ORDER — ACETAMINOPHEN 325 MG PO TABS
650.0000 mg | ORAL_TABLET | Freq: Four times a day (QID) | ORAL | Status: DC | PRN
  Administered 2013-04-27: 650 mg via ORAL
  Filled 2013-04-27: qty 2

## 2013-04-27 MED ORDER — WHITE PETROLATUM GEL
Status: AC
  Administered 2013-04-27: 0.2
  Filled 2013-04-27: qty 5

## 2013-04-27 MED ORDER — ENOXAPARIN SODIUM 40 MG/0.4ML ~~LOC~~ SOLN
40.0000 mg | SUBCUTANEOUS | Status: DC
  Administered 2013-04-27 – 2013-04-30 (×4): 40 mg via SUBCUTANEOUS
  Filled 2013-04-27 (×5): qty 0.4

## 2013-04-27 MED ORDER — HYDROCODONE-ACETAMINOPHEN 5-325 MG PO TABS
2.0000 | ORAL_TABLET | Freq: Four times a day (QID) | ORAL | Status: DC | PRN
  Administered 2013-04-27 – 2013-04-29 (×5): 2 via ORAL
  Filled 2013-04-27 (×5): qty 2

## 2013-04-27 MED ORDER — SODIUM CHLORIDE 0.9 % IJ SOLN
3.0000 mL | Freq: Two times a day (BID) | INTRAMUSCULAR | Status: DC
  Administered 2013-04-27 – 2013-04-30 (×7): 3 mL via INTRAVENOUS

## 2013-04-27 MED ORDER — MORPHINE SULFATE 2 MG/ML IJ SOLN
1.0000 mg | Freq: Once | INTRAMUSCULAR | Status: AC
  Administered 2013-04-27: 1 mg via INTRAVENOUS
  Filled 2013-04-27: qty 1

## 2013-04-27 MED ORDER — ALBUTEROL (5 MG/ML) CONTINUOUS INHALATION SOLN: 10.0000 mg/h | INHALATION_SOLUTION | Freq: Once | RESPIRATORY_TRACT | Status: DC

## 2013-04-27 MED ORDER — ZOLPIDEM TARTRATE 5 MG PO TABS
5.0000 mg | ORAL_TABLET | Freq: Once | ORAL | Status: AC
  Administered 2013-04-27: 5 mg via ORAL
  Filled 2013-04-27: qty 1

## 2013-04-27 MED ORDER — INSULIN ASPART 100 UNIT/ML ~~LOC~~ SOLN
0.0000 [IU] | Freq: Three times a day (TID) | SUBCUTANEOUS | Status: DC
  Administered 2013-04-27: 20 [IU] via SUBCUTANEOUS
  Administered 2013-04-28: 7 [IU] via SUBCUTANEOUS
  Administered 2013-04-28: 11 [IU] via SUBCUTANEOUS
  Administered 2013-04-28: 7 [IU] via SUBCUTANEOUS
  Administered 2013-04-29: 4 [IU] via SUBCUTANEOUS
  Administered 2013-04-29: 7 [IU] via SUBCUTANEOUS
  Administered 2013-04-29: 4 [IU] via SUBCUTANEOUS
  Administered 2013-04-30: 7 [IU] via SUBCUTANEOUS
  Administered 2013-04-30: 11 [IU] via SUBCUTANEOUS
  Administered 2013-04-30: 15 [IU] via SUBCUTANEOUS

## 2013-04-27 MED ORDER — ALBUTEROL (5 MG/ML) CONTINUOUS INHALATION SOLN: 10.0000 mg/h | INHALATION_SOLUTION | Freq: Once | RESPIRATORY_TRACT | Status: AC

## 2013-04-27 MED ORDER — SODIUM CHLORIDE 0.9 % IV SOLN
INTRAVENOUS | Status: DC
  Administered 2013-04-27 – 2013-04-28 (×3): via INTRAVENOUS

## 2013-04-27 MED ORDER — METHYLPREDNISOLONE SODIUM SUCC 125 MG IJ SOLR
60.0000 mg | Freq: Four times a day (QID) | INTRAMUSCULAR | Status: DC
  Administered 2013-04-27 – 2013-04-28 (×4): 60 mg via INTRAVENOUS
  Filled 2013-04-27 (×2): qty 2
  Filled 2013-04-27: qty 4
  Filled 2013-04-27: qty 2
  Filled 2013-04-27 (×4): qty 0.96

## 2013-04-27 MED ORDER — LEVALBUTEROL HCL 0.63 MG/3ML IN NEBU
0.6300 mg | INHALATION_SOLUTION | RESPIRATORY_TRACT | Status: DC | PRN
  Administered 2013-04-27: 0.63 mg via RESPIRATORY_TRACT
  Filled 2013-04-27 (×4): qty 3

## 2013-04-27 MED ORDER — ONDANSETRON HCL 4 MG/2ML IJ SOLN
4.0000 mg | Freq: Four times a day (QID) | INTRAMUSCULAR | Status: DC | PRN
  Administered 2013-04-27 – 2013-04-28 (×2): 4 mg via INTRAVENOUS
  Filled 2013-04-27 (×2): qty 2

## 2013-04-27 NOTE — ED Notes (Signed)
Discussed with patient need for social work consult -- pt has been using inhaler every other day, and other meds too-- in order to make meds last longer-- unable to afford refills.

## 2013-04-27 NOTE — ED Provider Notes (Signed)
CSN: [id number]     Arrival date & time 04/27/13  0753 History     First MD Initiated Contact with Patient 04/27/13 [phone]     Chief Complaint  Patient presents with  . Wheezing   HPI 42 year old female with PMH of Asthma (uncontrolled), HTN, and Diabetes presents with wheezing.  History limited secondary to acute dyspnea and wheezing.  Patient reports wheezing and SOB for 3 days.  Symptoms came on insidiously.  No recent URI or illness. No known sick contacts.  Over the past 3 days she has been using Albuterol frequently with some improvement.  However, her SOB continued to persist prompting her to come to the ED.  Current associated symptoms: chest pain, abdominal pain, headache.  Of note, she has been using her Symbicort inhaler every other day (attempting to make it last).   Past Medical History  Diagnosis Date  . DM (diabetes mellitus)   . Hypertension   . Hypokalemia   . Asthma    Past Surgical History  Procedure Laterality Date  . Cesarean section      x 3  . Appendectomy    . Vesicovaginal fistula closure w/ tah  2009  . Hernia repair     Family History  Problem Relation Age of Onset  . Heart attack Mother   . Stroke Mother   . Diabetes Mother   . Hypertension Mother   . Arthritis Mother   . Stroke Father   . Hypertension Sister   . Diabetes Sister   . Seizures Brother   . Diabetes Brother    History  Substance Use Topics  . Smoking status: Former Smoker -- 0.30 packs/day for 22 years    Types: Cigarettes    Quit date: 05/01/2011  . Smokeless tobacco: Never Used  . Alcohol Use: Yes     Comment: rare   OB History   Grav Para Term Preterm Abortions TAB SAB Ect Mult Living                 Review of Systems  Constitutional:       Patient reports possible fever last week. Occasional chills.  HENT:       Reports headache.  Respiratory: Positive for cough, chest tightness, shortness of breath and wheezing.   Gastrointestinal: Positive for diarrhea.     Allergies  Review of patient's allergies indicates no known allergies.  Home Medications   Current Outpatient Rx  Name  Route  Sig  Dispense  Refill  . acetaminophen (TYLENOL) 500 MG tablet   Oral   Take 1,000 mg by mouth every 6 (six) hours as needed for pain.         Marland Kitchen albuterol (PROVENTIL) (2.5 MG/3ML) 0.083% nebulizer solution   Nebulization   Take 3 mLs (2.5 mg total) by nebulization every 6 (six) hours as needed for wheezing.   30 vial   0   . chlorpheniramine-HYDROcodone (TUSSIONEX PENNKINETIC ER) 10-8 MG/5ML LQCR   Oral   Take 5 mLs by mouth every 12 (twelve) hours as needed.   80 mL   0   . predniSONE (DELTASONE) 20 MG tablet   Oral   Take 2 tablets (40 mg total) by mouth daily.   10 tablet   0    BP 160/106  Pulse 99  Temp(Src) 98.7 F (37.1 C)  Resp 20  SpO2 100% Physical Exam  Constitutional: She is oriented to person, place, and time. She appears well-developed and well-nourished.  In moderate respiratory distress.  HENT:  Head: Normocephalic and atraumatic.  Cardiovascular:  Tachycardic. Regular rhythm. No m/r/g  Pulmonary/Chest:  Acutely dyspneic. Diffuse wheezing with prolonged expiratory phase.  Breath sounds diminished.   Coarse breath sounds present after bronchodilator (in addition to above).  Abdominal: Soft. There is no tenderness.  Musculoskeletal: She exhibits no edema.  Neurological: She is alert and oriented to person, place, and time.  Skin: Skin is warm and dry.   ED Course   Procedures (including critical care time)  Labs Reviewed  GLUCOSE, CAPILLARY - Abnormal; Notable for the following:    Glucose-Capillary 222 (*)    All other components within normal limits  CBC - Abnormal; Notable for the following:    WBC 11.4 (*)    All other components within normal limits  BASIC METABOLIC PANEL  POCT I-STAT TROPONIN I   Dg Chest Port 1 View  04/27/2013   *RADIOLOGY REPORT*  Clinical Data: Shortness of breath, cough,  follow-up  PORTABLE CHEST - 1 VIEW  Comparison: Chest x-ray of 03/18/2013  Findings: The lungs are clear.  No infiltrate or effusion is seen. Mediastinal contours are stable.  The heart is within normal limits in size.  No bony abnormality is seen.  IMPRESSION: No active lung disease.  Stable chest x-ray.   Original Report Authenticated By: Dwyane Dee, M.D.   No diagnosis found.  MDM  42 year old female presents with acute asthma exacerbation. - Patient given Albuterol neb x 1 then placed on Continuous neb (10 mg/hr) for 1 hour. - Patient also started on IV Solumedrol - Chest xray unremarkable with no evidence of infiltrate - CBC revealed mild leukocytosis.  0930 - Some improvement with continuous neb, but patient still dyspneic with diffuse wheezing.  Additional Continuous Albuterol neb (10mg /hr) ordered.  1130 - Patient respiratory status improved but continues to have wheezing.  Doing okay on RA currently.  Will call [facility]ad and admit.   Tommie Sams, DO 04/27/13 1216

## 2013-04-27 NOTE — H&P (Signed)
[facility] History and Physical  [name] ZOX:[id number] DOB: [birth year] DOA: 04/27/2013  Referring physician: ED PCP: Eino Farber, MD   Chief Complaint:  Cough with SOB and wheezing x 2 days  HPI:  42 year old obese female with history of childhood asthma with recurrent exacerbations, hospitalization in past, never intubated, treated with off and on steroids, diabetes mellitus, obstructive sleep apnea on CPAP history of migraine who presented to the ED with 2 day history of progressive shortness of breath with wheezing. The symptoms started as a dry cough which persistently got worse and patient started to have a worsening shortness of breath wheezing wheezing not improved will need inhalers and nebulizer [facility]. She reports having several episodes of nausea and vomiting since yesterday and also several episodes of watery diarrhea. Denies eating anything outside. Denies any sick contacts. Reports having fever for 4 days back. She reports having severe headache with her migraine symptoms while in the ED. Denies blurred vision, chest pain, palpitations, orthopnea or PND, abdominal pain, urinary symptoms. Patient reports being compliant with her medications and follows up with [facility].  In the ED patient was given a dose of IV Solu Medrol 125 mg followed by continuous albuterol nebs and 2 doses of IV morphine for her headache. She was noted to be in respiratory distress and extremely wheezy with some improvement after nebulizer treatment. She was however tachycardic to 120s. Denies any chest discomfort. Labs showed a mild leukocytosis and low bicarb suggestive of resp acidosis. Chest x-ray was unremarkable. [facility] called to admit patient in stepdown.  Review of Systems:  Constitutional:fever, chills, diaphoresis, appetite change and fatigue.  HEENT: Denies photophobia, eye pain, redness, hearing loss, ear pain, congestion, sore  throat, rhinorrhea, sneezing, mouth sores, trouble swallowing, neck pain, neck stiffness and tinnitus.   Respiratory: SOB, DOE, cough, wheezing, and chest tightness,    Cardiovascular: Denies chest pain, palpitations and leg swelling.  Gastrointestinal:  nausea, vomiting and diarrhea, denies abdominal pain, constipation, blood in stool and abdominal distention.  Genitourinary: Denies dysuria, urgency, frequency, hematuria, flank pain and difficulty urinating.  Endocrine: Denies: hot or cold intolerance,  polyuria, polydipsia. Musculoskeletal: Denies myalgias, back pain, joint swelling, arthralgias and gait problem.  Skin: Denies pallor, rash and wound.  Neurological: Complains of headache, Denies dizziness, seizures, syncope, weakness, light-headedness, numbness  Hematological: Denies adenopathy. Easy bruising Psychiatric/Behavioral: Denies  mood changes, confusion, nervousness, sleep disturbance and agitation   Past Medical History  Diagnosis Date  . DM (diabetes mellitus)   . Hypertension   . Hypokalemia   . Asthma    Past Surgical History  Procedure Laterality Date  . Cesarean section      x 3  . Appendectomy    . Vesicovaginal fistula closure w/ tah  2009  . Hernia repair     Social History:  reports that she quit smoking about 1 years ago. Her smoking use included Cigarettes. She has a 6.6 pack-year smoking history. She has never used smokeless tobacco. She reports that  drinks alcohol. She reports that she does not use illicit drugs.  No Known Allergies  Family History  Problem Relation Age of Onset  . Heart attack Mother   . Stroke Mother   . Diabetes Mother   . Hypertension Mother   . Arthritis Mother   . Stroke Father   . Hypertension Sister   . Diabetes Sister   . Seizures Brother   . Diabetes Brother     Prior  to Admission medications   Medication Sig Start Date End Date Taking? Authorizing Provider  acetaminophen (TYLENOL) 500 MG tablet Take 1,000 mg by  mouth every 6 (six) hours as needed for pain.   Yes Historical Provider, MD  albuterol (PROVENTIL) (2.5 MG/3ML) 0.083% nebulizer solution Take 3 mLs (2.5 mg total) by nebulization every 6 (six) hours as needed for wheezing. 03/18/13  Yes Gavin Pound. Ghim, MD  insulin glargine (LANTUS) 100 UNIT/ML injection Inject 15-24 Units into the skin at bedtime.   Yes Historical Provider, MD  metFORMIN (GLUMETZA) 500 MG (MOD) 24 hr tablet Take 500 mg by mouth daily with breakfast.   Yes Historical Provider, MD  chlorpheniramine-HYDROcodone (TUSSIONEX PENNKINETIC ER) 10-8 MG/5ML LQCR Take 5 mLs by mouth every 12 (twelve) hours as needed. 03/18/13   Gavin Pound. Oletta Lamas, MD    Physical Exam:  Filed Vitals:   04/27/13 1030 04/27/13 1100 04/27/13 1115 04/27/13 1200  BP: 124/68 120/67 123/64 119/57  Pulse: 138 137 127 120  Temp:      Resp: 22 22 21 25   SpO2: 99% 99% 97% 98%    Constitutional: Vital signs reviewed.  Patient is a middle aged obese female lying in some distress HEENT: No pallor, moist oral mucosa, neck supple Chest: Diminished breath sounds bilaterally. Scattered wheezing, no crackles or rhonchi CVS: S1 and S2 tachycardic, no murmurs rub or gallop Abdomen: Soft, nontender, nondistended, bowel sounds present  Musculoskeletal: No joint deformities, erythema, or stiffness, ROM full and no nontender Ext: no edema and no cyanosis, pulses palpable bilaterally Hematology: no cervical adenopathy.  Neurological: A&O x3, nonfocal  Labs on Admission:  Basic Metabolic Panel:  Recent Labs Lab 04/27/13 0812  NA 136  K 3.6  CL 102  CO2 18*  GLUCOSE 213*  BUN 9  CREATININE 0.87  CALCIUM 9.4   Liver Function Tests: No results found for this basename: AST, ALT, ALKPHOS, BILITOT, PROT, ALBUMIN,  in the last 168 hours No results found for this basename: LIPASE, AMYLASE,  in the last 168 hours No results found for this basename: AMMONIA,  in the last 168 hours CBC:  Recent Labs Lab 04/27/13 0812   WBC 11.4*  HGB 13.8  HCT 38.4  MCV 85.3  PLT 276   Cardiac Enzymes: No results found for this basename: CKTOTAL, CKMB, CKMBINDEX, TROPONINI,  in the last 168 hours BNP: No components found with this basename: POCBNP,  CBG:  Recent Labs Lab 04/27/13 0759  GLUCAP 222*    Radiological Exams on Admission: Dg Chest Port 1 View  04/27/2013   *RADIOLOGY REPORT*  Clinical Data: Shortness of breath, cough, follow-up  PORTABLE CHEST - 1 VIEW  Comparison: Chest x-ray of 03/18/2013  Findings: The lungs are clear.  No infiltrate or effusion is seen. Mediastinal contours are stable.  The heart is within normal limits in size.  No bony abnormality is seen.  IMPRESSION: No active lung disease.  Stable chest x-ray.   Original Report Authenticated By: Dwyane Dee, M.D.    Assessment/Plan Principal Problem:   Asthma exacerbation Admit stepdown monitoring overnight given acute symptoms Continue IV Solu-Medrol 60 mg every 6 hours. Scheduled xopenex q4hr given tachycardia. Prn xopenex nebs q2hr for wheezing.  Continue antitussives. Given recent fever and cough, patient likely had underlying bronchitis which triggered her symptoms. We'll treat her with a course of Levaquin     Active Problems:   Acute respiratory failure with hypoxia Currently satting well on room air. Continue oxygen as needed, IV Solu-Medrol  and scheduled nebs    Uncontrolled diabetes mellitus  check hemoglobin A1c. based on Lantus 20 units at bedtime along with sliding scale insulin. Hold metformin    Sleep apnea, obstructive On CPAP at home which I have ordered. Does not remember the settings     Viral gastroenteritis Patient reports symptoms of nausea, vomiting and watery diarrhea since yesterday. Last episode of diarrhea was this morning. Denies any blood in stool. Monitor on IV hydration and antiemetics. Monitor lites. Will not order any antibiotics.   migraine headache We'll order Vicodin and Reglan.   DVT  prophylaxis Diet: Diabetic  Code Status:  full code  Family Communication: None at bedside Disposition Plan:  home once stable   Eddie North [facility] Pager  [phone]  If 7PM-7AM, please contact night-coverage [email or web address] Password [facility] 04/27/2013, 12:34 PM     total time spent: 70 minutes

## 2013-04-27 NOTE — ED Notes (Signed)
resp easier-- continues to have tight cough--

## 2013-04-27 NOTE — ED Provider Notes (Signed)
I saw and evaluated the patient with the resident physician, Dr. Adriana Simas.  I saw and interpreted relevant studies and EKG, as necessary.  I agree with the interpretation. The documentation is appropriate and accurate.  Following multiple albuterol nebulizer treatments the patient substantially better.  She did require admission for further evaluation and management.  Gerhard Munch, MD 04/27/13 1550

## 2013-04-27 NOTE — ED Notes (Signed)
Wheezing x 3 days and cp states has been using tx and inhalers not helping

## 2013-04-27 NOTE — Progress Notes (Signed)
Pt decided to wear CPAP tonight, Pt placed on Auto CPAP via FFM with a max pressure of 15 and a min pressure of 5, Pt tolerating well at this time, RT to monitor and assess as needed.

## 2013-04-27 NOTE — Progress Notes (Signed)
Pt stated that she thinks she is going to hold off on the CPAP for the night, Pt to let RT know if she changes her mind, RT to monitor and assess as needed.

## 2013-04-28 DIAGNOSIS — G4733 Obstructive sleep apnea (adult) (pediatric): Secondary | ICD-10-CM

## 2013-04-28 DIAGNOSIS — A088 Other specified intestinal infections: Secondary | ICD-10-CM

## 2013-04-28 LAB — CBC
Hemoglobin: 12.3 g/dL (ref 12.0–15.0)
MCH: 29.6 pg (ref 26.0–34.0)
MCV: 85.3 fL (ref 78.0–100.0)
RBC: 4.15 MIL/uL (ref 3.87–5.11)

## 2013-04-28 LAB — GLUCOSE, CAPILLARY
Glucose-Capillary: 234 mg/dL — ABNORMAL HIGH (ref 70–99)
Glucose-Capillary: 285 mg/dL — ABNORMAL HIGH (ref 70–99)

## 2013-04-28 MED ORDER — PREDNISONE 20 MG PO TABS
40.0000 mg | ORAL_TABLET | Freq: Every day | ORAL | Status: DC
  Administered 2013-04-29: 40 mg via ORAL
  Filled 2013-04-28 (×2): qty 2

## 2013-04-28 MED ORDER — SODIUM CHLORIDE 0.45 % IV SOLN
INTRAVENOUS | Status: DC
  Administered 2013-04-28: 17:00:00 via INTRAVENOUS

## 2013-04-28 MED ORDER — CLONIDINE HCL 0.1 MG PO TABS
0.1000 mg | ORAL_TABLET | Freq: Three times a day (TID) | ORAL | Status: DC
  Administered 2013-04-28 – 2013-05-01 (×9): 0.1 mg via ORAL
  Filled 2013-04-28 (×11): qty 1

## 2013-04-28 NOTE — Progress Notes (Signed)
Inpatient Diabetes Program Recommendations  AACE/ADA: New Consensus Statement on Inpatient Glycemic Control (2013)  Target Ranges:  Prepandial:   less than 140 mg/dL      Peak postprandial:   less than 180 mg/dL (1-2 hours)      Critically ill patients:  140 - 180 mg/dL   Reason for Visit: Spoke briefly with patient regarding diabetes management.  States that she lost her Medicaid and her MD dismissed her due to lack of insurance.  A1C=8.9%.  Discussed importance of diabetes management and talked to her about the [facility].  May benefit from more affordable insulin regimen at discharge such as NPH which can be purchased at [facility] for 24.88$ per vial.  May also benefit from the addition of generic oral agent also.  Will order case management consult for assistance with finding PCP (?wellness clinic) and medication assistance.  Will follow.    Consider increasing Novolog correction to q 4 hours while on IV Solumedrol.

## 2013-04-28 NOTE — Care Management Note (Signed)
    Page 1 of 1   04/28/2013     2:25:18 PM   CARE MANAGEMENT NOTE 04/28/2013  Patient:  [name], [name]   Account Number:  [id number]  Date Initiated:  04/27/2013  Documentation initiated by:  Junius Creamer  Subjective/Objective Assessment:   adm w asthma exacerb     Action/Plan:   lives w fam,  pt has no pcp at present since she lost her medicaid.   Anticipated DC Date:     Anticipated DC Plan:        DC Planning Services  CM consult      Choice offered to / List presented to:             Status of service:   Medicare Important Message given?   (If response is "NO", the following Medicare IM given date fields will be blank) Date Medicare IM given:   Date Additional Medicare IM given:    Discharge Disposition:    Per UR Regulation:  Reviewed for med. necessity/level of care/duration of stay  If discussed at Long Length of Stay Meetings, dates discussed:    Comments:  8/7 1420 debbie Layna Roeper rn,bsn spoke w pt and gave her primary care resource list. pt agreeable to [place] and wellness clinic appt. have faxed over to see if we can get appt to establish pt so she will have pcp. gave her 2 prescription discount cards. will cont to follow.

## 2013-04-28 NOTE — Progress Notes (Signed)
[facility] Progress Note [place] TEAM 1 - Stepdown/ICU TEAM   Lynnmarie Lovett NWG:[id number] DOB: [birth year] DOA: 04/27/2013 PCP: Eino Farber, MD  Brief narrative: 42 y/o with asthma (severe), DM, OSA presented with wheezing and dyspnea. She reports having several episodes of nausea and vomiting and also several episodes of watery diarrhea   Assessment/Plan: Principal Problem:   Asthma exacerbation/  Acute respiratory failure with hypoxia - taper steroids - transfer out of SDU  Active Problems:      Viral gastroenteritis - poor po intake with severe nausea - cont IVF and Anti-emetics-  - change from regular diet to liquids   Sleep apnea, obstructive - CPAP at bedtime    Uncontrolled diabetes mellitus - sugars high today due to steriods- will be tapering them down- no further solu-medrol to be given today  HTN - takes diuretics at home but we are hydrating her due to vomiting and diarrhea - place on clonidine for now -change IVF to 1/2 NS    Obesity  Code Status: full Family Communication: none Disposition Plan: transfer to med/surg  Consultants: none  Procedures: none  Antibiotics: none  DVT prophylaxis: lovenox  HPI/Subjective: Severe nausea and ongoing diarrhea- breathing better.    Objective: Blood pressure 151/98, pulse 76, temperature 98.4 F (36.9 C), temperature source Oral, resp. rate 18, height 5\' 1"  (1.549 m), weight 106 kg (233 lb 11 oz), SpO2 100.00%.  Intake/Output Summary (Last 24 hours) at 04/28/13 1813 Last data filed at 04/28/13 1200  Gross per 24 hour  Intake   2270 ml  Output   1950 ml  Net    320 ml     Exam: General: No acute respiratory distress Lungs: Clear to auscultation bilaterally without wheezes or crackles Cardiovascular: Regular rate and rhythm without murmur gallop or rub normal S1 and S2 Abdomen: tender mildly- diffuse , nondistended, soft, bowel sounds positive, no rebound, no  ascites, no appreciable mass Extremities: No significant cyanosis, clubbing, or edema bilateral lower extremities  Data Reviewed: Basic Metabolic Panel:  Recent Labs Lab 04/27/13 0812  NA 136  K 3.6  CL 102  CO2 18*  GLUCOSE 213*  BUN 9  CREATININE 0.87  CALCIUM 9.4   Liver Function Tests: No results found for this basename: AST, ALT, ALKPHOS, BILITOT, PROT, ALBUMIN,  in the last 168 hours No results found for this basename: LIPASE, AMYLASE,  in the last 168 hours No results found for this basename: AMMONIA,  in the last 168 hours CBC:  Recent Labs Lab 04/27/13 0812 04/28/13 0450  WBC 11.4* 14.9*  HGB 13.8 12.3  HCT 38.4 35.4*  MCV 85.3 85.3  PLT 276 279   Cardiac Enzymes: No results found for this basename: CKTOTAL, CKMB, CKMBINDEX, TROPONINI,  in the last 168 hours BNP (last 3 results) No results found for this basename: PROBNP,  in the last 8760 hours CBG:  Recent Labs Lab 04/27/13 1723 04/27/13 2204 04/28/13 0809 04/28/13 1211 04/28/13 1639  GLUCAP 380* 344* 243* 234* 285*    Recent Results (from the past 240 hour(s))  MRSA PCR SCREENING     Status: None   Collection Time    04/27/13  2:10 PM      Result Value Range Status   MRSA by PCR NEGATIVE  NEGATIVE Final   Comment:            The GeneXpert MRSA Assay (FDA     approved for NASAL specimens     only),  is one component of a     comprehensive MRSA colonization     surveillance program. It is not     intended to diagnose MRSA     infection nor to guide or     monitor treatment for     MRSA infections.     Studies:  Recent x-ray studies have been reviewed in detail by the Attending Physician  Scheduled Meds:  Scheduled Meds: . cloNIDine  0.1 mg Oral TID  . enoxaparin (LOVENOX) injection  40 mg Subcutaneous Q24H  . insulin aspart  0-20 Units Subcutaneous TID WC  . insulin glargine  20 Units Subcutaneous QHS  . levalbuterol  0.63 mg Nebulization Q4H  . levofloxacin  750 mg Oral Daily   . pneumococcal 23 valent vaccine  0.5 mL Intramuscular Tomorrow-1000  . [START ON 04/29/2013] predniSONE  40 mg Oral Q breakfast  . sodium chloride  3 mL Intravenous Q12H   Continuous Infusions: . sodium chloride 100 mL/hr at 04/28/13 1655    Time spent on care of this patient: 35 min   Rubena Roseman, MD  [facility] Office  [phone] Pager - Text Page per Loretha Stapler as per below:  On-Call/Text Page:      [email or web address]      password [email or web address]  If 7PM-7AM, please contact night-coverage [email or web address] Password [email or web address] 04/28/2013, 6:13 PM   LOS: 1 day

## 2013-04-29 ENCOUNTER — Other Ambulatory Visit: Payer: Self-pay

## 2013-04-29 LAB — GLUCOSE, CAPILLARY
Glucose-Capillary: 264 mg/dL — ABNORMAL HIGH (ref 70–99)
Glucose-Capillary: 333 mg/dL — ABNORMAL HIGH (ref 70–99)

## 2013-04-29 LAB — BASIC METABOLIC PANEL
BUN: 13 mg/dL (ref 6–23)
CO2: 21 mEq/L (ref 19–32)
Chloride: 108 mEq/L (ref 96–112)
Creatinine, Ser: 0.97 mg/dL (ref 0.50–1.10)
GFR calc Af Amer: 82 mL/min — ABNORMAL LOW (ref >90)
Glucose, Bld: 194 mg/dL — ABNORMAL HIGH (ref 70–99)
Potassium: 3.4 mEq/L — ABNORMAL LOW (ref 3.5–5.1)

## 2013-04-29 LAB — D-DIMER, QUANTITATIVE: D-Dimer, Quant: 0.33 ug/mL-FEU (ref 0.00–0.48)

## 2013-04-29 MED ORDER — METHYLPREDNISOLONE SODIUM SUCC 125 MG IJ SOLR
60.0000 mg | Freq: Two times a day (BID) | INTRAMUSCULAR | Status: DC
  Administered 2013-04-29 – 2013-04-30 (×2): 60 mg via INTRAVENOUS
  Filled 2013-04-29 (×2): qty 0.96
  Filled 2013-04-29: qty 2
  Filled 2013-04-29: qty 0.96
  Filled 2013-04-29: qty 2

## 2013-04-29 MED ORDER — HYDROCHLOROTHIAZIDE 25 MG PO TABS
25.0000 mg | ORAL_TABLET | Freq: Every day | ORAL | Status: DC
  Administered 2013-04-29 – 2013-05-01 (×3): 25 mg via ORAL
  Filled 2013-04-29 (×3): qty 1

## 2013-04-29 MED ORDER — OXYCODONE HCL 5 MG PO TABS
5.0000 mg | ORAL_TABLET | ORAL | Status: DC | PRN
  Administered 2013-04-30: 10 mg via ORAL
  Filled 2013-04-29: qty 2

## 2013-04-29 MED ORDER — IPRATROPIUM BROMIDE 0.02 % IN SOLN
0.5000 mg | Freq: Four times a day (QID) | RESPIRATORY_TRACT | Status: DC
  Administered 2013-04-29 – 2013-05-01 (×7): 0.5 mg via RESPIRATORY_TRACT
  Filled 2013-04-29 (×7): qty 2.5

## 2013-04-29 MED ORDER — GABAPENTIN 300 MG PO CAPS
300.0000 mg | ORAL_CAPSULE | Freq: Three times a day (TID) | ORAL | Status: DC
  Administered 2013-04-29 – 2013-05-01 (×6): 300 mg via ORAL
  Filled 2013-04-29 (×8): qty 1

## 2013-04-29 MED ORDER — LEVALBUTEROL HCL 0.63 MG/3ML IN NEBU
0.6300 mg | INHALATION_SOLUTION | Freq: Four times a day (QID) | RESPIRATORY_TRACT | Status: DC
  Administered 2013-04-29 – 2013-05-01 (×7): 0.63 mg via RESPIRATORY_TRACT
  Filled 2013-04-29 (×11): qty 3

## 2013-04-29 MED ORDER — BENAZEPRIL-HYDROCHLOROTHIAZIDE 20-25 MG PO TABS: 1.0000 | ORAL_TABLET | Freq: Every day | ORAL | Status: DC

## 2013-04-29 MED ORDER — BENAZEPRIL HCL 20 MG PO TABS
20.0000 mg | ORAL_TABLET | Freq: Every day | ORAL | Status: DC
  Administered 2013-04-29 – 2013-05-01 (×3): 20 mg via ORAL
  Filled 2013-04-29 (×3): qty 1

## 2013-04-29 MED ORDER — BUDESONIDE 0.5 MG/2ML IN SUSP
0.5000 mg | Freq: Two times a day (BID) | RESPIRATORY_TRACT | Status: DC
  Administered 2013-04-29 – 2013-05-01 (×4): 0.5 mg via RESPIRATORY_TRACT
  Filled 2013-04-29 (×6): qty 2

## 2013-04-29 MED ORDER — ASPIRIN 81 MG PO CHEW
81.0000 mg | CHEWABLE_TABLET | Freq: Every day | ORAL | Status: DC
  Administered 2013-04-29 – 2013-05-01 (×3): 81 mg via ORAL
  Filled 2013-04-29 (×3): qty 1

## 2013-04-29 NOTE — Progress Notes (Signed)
Annalysse Docia Barrier is a 42 y.o. female patient who transferred  from 2900 with asthma exaserbation awake, alert  & orientated  X 3, Full Code, VSS - Blood pressure 145/95, pulse 63, temperature 98.2 F (36.8 C), temperature source Oral, resp. rate 15, height 5\' 1"  (1.549 m), weight 106 kg (233 lb 11 oz), SpO2 98.00%., , no c/o shortness of breath, no c/o chest pain, no distress noted. Tele # [id number] placed and pt is currently running:normal sinus rhythm.   IV site WDL: antecubital right, condition patent and no redness with a transparent dsg that's clean dry and intact.  Allergies:  No Known Allergies   Past Medical History  Diagnosis Date  . DM (diabetes mellitus)   . Hypertension   . Hypokalemia   . Asthma     Pt orientation to unit, room and routine. SR up x 2, fall risk assessment complete with Patient and family verbalizing understanding of risks associated with falls. Pt verbalizes an understanding of how to use the call bell and to call for help before getting out of bed.  Skin, clean-dry- intact without evidence of bruising, or skin tears.   No evidence of skin break down noted on exam.     Will cont to monitor and assist as needed.  Cindra Eves, RN 04/29/2013 6:13 PM

## 2013-04-29 NOTE — Progress Notes (Signed)
[facility] Progress Note [place] TEAM 1 - Stepdown/ICU TEAM   [name] WGN:[id number] DOB: Mar 04, 1971 DOA: 04/27/2013 PCP: Eino Farber, MD  Brief narrative: 42 y/o with reported severe asthma with recurrent exacerbations, DM, and OSA who presented with 2 day history of wheezing and dyspnea. She also reported having several episodes of nausea and vomiting with several episodes of watery diarrhea.  In the ED patient was given a dose of IV Solu Medrol 125 mg followed by continuous albuterol nebs and 2 doses of IV morphine for a headache. She was noted to be in respiratory distress and extremely wheezy with some improvement after nebulizer treatment. She was however tachycardic to 120s. Denied any chest discomfort.   Assessment/Plan:  Asthma exacerbation /  Acute respiratory failure with hypoxia - slow taper of steroids as wheezing persists - add inhaled steroids - cont empiric abx  - transfer out of SDU as pt clinically improving, albeit slowly  - measure pre and post peak flows  Chest pain - EKG during pain NSR with no acute ST/T changes  - pt reports having this pain frequently during episodes of wheezing  - check d-dimer as pt at risk for PE due to morbid obesity   Viral gastroenteritis - poor po intake with severe nausea - diarrhea has now resolved - cont supportive care   Sleep apnea, obstructive - CPAP at bedtime as per home regimen  Uncontrolled diabetes mellitus - CBGs have been elevated due to steriods - cont SSI and adjust tx as indicated   HTN - takes diuretics at home  - on clonidine for now - BP well controlled at present, but has had episodes of uncontrolled BP during this admit  Obesity  Code Status: FULL Family Communication: spoke w/ pt and multiple family members at bedside  Disposition Plan: transfer to med/surg  Consultants: none  Procedures: none  Antibiotics: Levaquin 8/6 >>  DVT  prophylaxis: lovenox  HPI/Subjective: C/o central chest pain - acute onset while sitting in bed - associated with wheezing.  No vomiting.  Diarrhea has resolved.    Objective: Blood pressure 131/80, pulse 88, temperature 98.4 F (36.9 C), temperature source Oral, resp. rate 20, height 5\' 1"  (1.549 m), weight 106 kg (233 lb 11 oz), SpO2 99.00%.  Intake/Output Summary (Last 24 hours) at 04/29/13 1440 Last data filed at 04/29/13 1400  Gross per 24 hour  Intake   2763 ml  Output   2450 ml  Net    313 ml    Exam:  General: persistent exp wheezing  Lungs: diffuse exp wheezing with no focal crackles Cardiovascular: Regular rate and rhythm without murmur gallop or rub  Abdomen: morbidly obese, bs+, no focal crackles  Extremities: trace B le edema - no cyanosis or clubbing   Data Reviewed: Basic Metabolic Panel:  Recent Labs Lab 04/27/13 0812 04/29/13 0754  NA 136 139  K 3.6 3.4*  CL 102 108  CO2 18* 21  GLUCOSE 213* 194*  BUN 9 13  CREATININE 0.87 0.97  CALCIUM 9.4 8.8   Liver Function Tests: No results found for this basename: AST, ALT, ALKPHOS, BILITOT, PROT, ALBUMIN,  in the last 168 hours  CBC:  Recent Labs Lab 04/27/13 0812 04/28/13 0450  WBC 11.4* 14.9*  HGB 13.8 12.3  HCT 38.4 35.4*  MCV 85.3 85.3  PLT 276 279   CBG:  Recent Labs Lab 04/28/13 1211 04/28/13 1639 04/28/13 2212 04/29/13 0800 04/29/13 1203  GLUCAP 234* 285* 233*  185* 177*    Recent Results (from the past 240 hour(s))  MRSA PCR SCREENING     Status: None   Collection Time    04/27/13  2:10 PM      Result Value Range Status   MRSA by PCR NEGATIVE  NEGATIVE Final   Comment:            The GeneXpert MRSA Assay (FDA     approved for NASAL specimens     only), is one component of a     comprehensive MRSA colonization     surveillance program. It is not     intended to diagnose MRSA     infection nor to guide or     monitor treatment for     MRSA infections.      Studies:  Recent x-ray studies have been reviewed in detail by the Attending Physician  Scheduled Meds:  Scheduled Meds: . aspirin  81 mg Oral Daily  . cloNIDine  0.1 mg Oral TID  . enoxaparin (LOVENOX) injection  40 mg Subcutaneous Q24H  . insulin aspart  0-20 Units Subcutaneous TID WC  . insulin glargine  20 Units Subcutaneous QHS  . levalbuterol  0.63 mg Nebulization Q4H  . levofloxacin  750 mg Oral Daily  . predniSONE  40 mg Oral Q breakfast  . sodium chloride  3 mL Intravenous Q12H   Time spent on care of this patient: 35 min  MCCLUNG,JEFFREY T, MD  [facility] Office  [phone] Pager - Text Page per Loretha Stapler as per below:  On-Call/Text Page:      [email or web address]      password [email or web address]  If 7PM-7AM, please contact night-coverage [email or web address] Password [email or web address] 04/29/2013, 2:40 PM   LOS: 2 days

## 2013-04-29 NOTE — Progress Notes (Signed)
Pt transferred to 5W35 by RN on RA. Pt called and notified family of transfer. All pt belongings sent with pt at time of transfer. Report and care given to Diane, RN.   Delynn Flavin, RN

## 2013-04-29 NOTE — Progress Notes (Signed)
Patient refusing CPAP at this time

## 2013-04-29 NOTE — Progress Notes (Signed)
Pt. Refuses to wear CPAP at this time. Pt. Stated that she would call if she changed her mind & decided to wear CPAP.

## 2013-04-29 NOTE — Progress Notes (Signed)
Inpatient Diabetes Program Recommendations  AACE/ADA: New Consensus Statement on Inpatient Glycemic Control (2013)  Target Ranges:  Prepandial:   less than 140 mg/dL      Peak postprandial:   less than 180 mg/dL (1-2 hours)      Critically ill patients:  140 - 180 mg/dL   Reason for Visit:  Results for [name], [name] (MRN [id number]) as of 04/29/2013 10:49  Ref. Range 04/28/2013 12:11 04/28/2013 16:39 04/28/2013 22:12 04/29/2013 07:54 04/29/2013 08:00  Glucose-Capillary Latest Range: 70-99 mg/dL 295 (H) 284 (H) 132 (H)  185 (H)   Note steroids tapered to PO Prednisone.  Consider adding Novolog 4 units tid with meals (hold if patient eats less than 50%).

## 2013-04-29 NOTE — Progress Notes (Signed)
Pt called nurse to report chest pain 9/10 that sits mid chest with "tightness" and radiates to the back. She says this feels differently than the muscular pain she had this AM associated with coughing/asthma exacerbation. She says she started feeling this pain at 12:00PM but was trying to see if it would go away on its own. Placed on 2L [place], BP 127/72(87) which is lower than noon reading of 168/88(118). HR 98, NSR, sats 100% on RA prior to placing oxygen.   MD Sharon Seller) notified via text page. STAT EKG done by bedside RN and placed in chart, reading is NSR with HR 89. Pt given 81mg  chewable aspirin and PRN vicodin for pain. Will monitor.   Delynn Flavin, RN, BSN

## 2013-04-30 LAB — GLUCOSE, CAPILLARY
Glucose-Capillary: 288 mg/dL — ABNORMAL HIGH (ref 70–99)
Glucose-Capillary: 339 mg/dL — ABNORMAL HIGH (ref 70–99)
Glucose-Capillary: 245 mg/dL — ABNORMAL HIGH (ref 70–99)
Glucose-Capillary: 139 mg/dL — ABNORMAL HIGH (ref 70–99)

## 2013-04-30 MED ORDER — INSULIN ASPART PROT & ASPART (70-30 MIX) 100 UNIT/ML ~~LOC~~ SUSP
30.0000 [IU] | Freq: Two times a day (BID) | SUBCUTANEOUS | Status: DC
  Administered 2013-04-30 – 2013-05-01 (×2): 30 [IU] via SUBCUTANEOUS
  Filled 2013-04-30 (×2): qty 10

## 2013-04-30 MED ORDER — PREDNISONE 50 MG PO TABS
60.0000 mg | ORAL_TABLET | Freq: Every day | ORAL | Status: DC
  Administered 2013-05-01: 60 mg via ORAL
  Filled 2013-04-30 (×2): qty 1

## 2013-04-30 NOTE — Progress Notes (Signed)
[facility] Progress Note [name] TEAM 1 - Stepdown/ICU TEAM   [name] ZOX:[id number] DOB: 18-Sep-1971 DOA: 04/27/2013 PCP: Eino Farber, MD  HPI/Subjective: Low-grade temperature and elevated blood sugar.   Brief narrative: 42 y/o with reported severe asthma with recurrent exacerbations, DM, and OSA who presented with 2 day history of wheezing and dyspnea. She also reported having several episodes of nausea and vomiting with several episodes of watery diarrhea.  In the ED patient was given a dose of IV Solu Medrol 125 mg followed by continuous albuterol nebs and 2 doses of IV morphine for a headache. She was noted to be in respiratory distress and extremely wheezy with some improvement after nebulizer treatment. She was however tachycardic to 120s. Denied any chest discomfort.   Assessment/Plan:  Asthma exacerbation /  Acute respiratory failure with hypoxia - slow taper of steroids as wheezing persists - add inhaled steroids - cont empiric abx  - Continues to have wheezes. - Asthma exacerbation caused by acute bronchitis. - Continue steroids, bronchodilators, mucolytics and antitussives.  Uncontrolled diabetes mellitus - CBGs have been elevated due to steriods. - Patient is on a sliding scale, she is on Lantus insulin, patient said she lost her insurance. -Likely to be discharged on 70/30 insulin mix as it might be more affordable to her.  Fever/acute bronchitis -Low-grade fever, patient is on Levaquin already for acute bronchitis. -Continue Levaquin.  Chest pain - EKG during pain NSR with no acute ST/T changes  - pt reports having this pain frequently during episodes of wheezing  - check d-dimer as pt at risk for PE due to morbid obesity   Viral gastroenteritis - poor po intake with severe nausea - diarrhea has now resolved - cont supportive care   Sleep apnea, obstructive - CPAP at bedtime as per home regimen   HTN - takes  diuretics at home  - on clonidine for now - BP well controlled at present, but has had episodes of uncontrolled BP during this admit  Obesity  Code Status: FULL Family Communication: spoke w/ pt and multiple family members at bedside  Disposition Plan: transfer to med/surg  Consultants: none  Procedures: none  Antibiotics: Levaquin 8/6 >>  DVT prophylaxis: lovenox    Objective: Blood pressure 128/85, pulse 84, temperature 99.1 F (37.3 C), temperature source Oral, resp. rate 18, height 5\' 1"  (1.549 m), weight 106 kg (233 lb 11 oz), SpO2 100.00%.  Intake/Output Summary (Last 24 hours) at 04/30/13 1347 Last data filed at 04/30/13 1323  Gross per 24 hour  Intake   1113 ml  Output    400 ml  Net    713 ml    Exam:  General: persistent exp wheezing  Lungs: diffuse exp wheezing with no focal crackles Cardiovascular: Regular rate and rhythm without murmur gallop or rub  Abdomen: morbidly obese, bs+, no focal crackles  Extremities: trace B le edema - no cyanosis or clubbing   Data Reviewed: Basic Metabolic Panel:  Recent Labs Lab 04/27/13 0812 04/29/13 0754  NA 136 139  K 3.6 3.4*  CL 102 108  CO2 18* 21  GLUCOSE 213* 194*  BUN 9 13  CREATININE 0.87 0.97  CALCIUM 9.4 8.8   Liver Function Tests: No results found for this basename: AST, ALT, ALKPHOS, BILITOT, PROT, ALBUMIN,  in the last 168 hours  CBC:  Recent Labs Lab 04/27/13 0812 04/28/13 0450  WBC 11.4* 14.9*  HGB 13.8 12.3  HCT 38.4 35.4*  MCV 85.3  85.3  PLT 276 279   CBG:  Recent Labs Lab 04/29/13 1203 04/29/13 1707 04/29/13 2130 04/30/13 0750 04/30/13 1156  GLUCAP 177* 264* 333* 288* 339*    Recent Results (from the past 240 hour(s))  MRSA PCR SCREENING     Status: None   Collection Time    04/27/13  2:10 PM      Result Value Range Status   MRSA by PCR NEGATIVE  NEGATIVE Final   Comment:            The GeneXpert MRSA Assay (FDA     approved for NASAL specimens     only), is  one component of a     comprehensive MRSA colonization     surveillance program. It is not     intended to diagnose MRSA     infection nor to guide or     monitor treatment for     MRSA infections.     Studies:  Recent x-ray studies have been reviewed in detail by the Attending Physician  Scheduled Meds:  Scheduled Meds: . aspirin  81 mg Oral Daily  . benazepril  20 mg Oral Daily  . budesonide (PULMICORT) nebulizer solution  0.5 mg Nebulization BID  . cloNIDine  0.1 mg Oral TID  . enoxaparin (LOVENOX) injection  40 mg Subcutaneous Q24H  . gabapentin  300 mg Oral TID  . hydrochlorothiazide  25 mg Oral Daily  . insulin aspart  0-20 Units Subcutaneous TID WC  . insulin glargine  20 Units Subcutaneous QHS  . ipratropium  0.5 mg Nebulization Q6H  . levalbuterol  0.63 mg Nebulization Q6H  . levofloxacin  750 mg Oral Daily  . methylPREDNISolone (SOLU-MEDROL) injection  60 mg Intravenous Q12H  . sodium chloride  3 mL Intravenous Q12H   Time spent on care of this patient: 35 min  Clydia Llano A, MD  [facility] Office  [phone] Pager - Text Page per Loretha Stapler as per below:  On-Call/Text Page:      [email or web address]      password [email or web address]  If 7PM-7AM, please contact night-coverage [email or web address] Password [email or web address] 04/30/2013, 1:47 PM   LOS: 3 days

## 2013-04-30 NOTE — Progress Notes (Signed)
Was not informed by nurse tech that pt's BP was 177/108. Recheck BP. BP in now 163/88. Will continue to monitor patient. Nelda Marseille, RN

## 2013-05-01 DIAGNOSIS — J209 Acute bronchitis, unspecified: Secondary | ICD-10-CM | POA: Diagnosis present

## 2013-05-01 MED ORDER — LEVOFLOXACIN 750 MG PO TABS: 750.0000 mg | ORAL_TABLET | Freq: Every day | ORAL | Status: DC

## 2013-05-01 MED ORDER — METFORMIN HCL 500 MG PO TABS: 1000.0000 mg | ORAL_TABLET | Freq: Two times a day (BID) | ORAL | Status: DC

## 2013-05-01 MED ORDER — PREDNISONE 10 MG PO TABS: ORAL_TABLET | ORAL | Status: DC

## 2013-05-01 MED ORDER — ALBUTEROL SULFATE (2.5 MG/3ML) 0.083% IN NEBU: 2.5000 mg | INHALATION_SOLUTION | Freq: Four times a day (QID) | RESPIRATORY_TRACT | Status: DC | PRN

## 2013-05-01 MED ORDER — INSULIN NPH ISOPHANE & REGULAR (70-30) 100 UNIT/ML ~~LOC~~ SUSP: 30.0000 [IU] | Freq: Two times a day (BID) | SUBCUTANEOUS | Status: DC

## 2013-05-01 NOTE — Discharge Summary (Signed)
Physician Discharge Summary  [name] NWG:[id number] DOB: [birth year] DOA: 04/27/2013  PCP: Eino Farber, MD  Admit date: 04/27/2013 Discharge date: 05/01/2013  Time spent: *40* minutes  Recommendations for Outpatient Follow-up:  1. Followup with primary care physician in one week, details below. 2. Bring CBG log book for further adjustment of diabetes regimen  Discharge Diagnoses:  Principal Problem:   Asthma exacerbation Active Problems:   Sleep apnea, obstructive   Acute respiratory failure with hypoxia   Uncontrolled diabetes mellitus   Obesity   Viral gastroenteritis   Discharge Condition: Stable  Diet recommendation: Carbohydrate modified diet  Filed Weights   04/27/13 1436  Weight: 106 kg (233 lb 11 oz)    History of present illness:  42 y/o with reported severe asthma with recurrent exacerbations, DM, and OSA who presented with 2 day history of wheezing and dyspnea. She also reported having several episodes of nausea and vomiting with several episodes of watery diarrhea.  In the ED patient was given a dose of IV Solu Medrol 125 mg followed by continuous albuterol nebs and 2 doses of IV morphine for a headache. She was noted to be in respiratory distress and extremely wheezy with some improvement after nebulizer treatment. She was however tachycardic to 120s. Denied any chest discomfort.   Hospital Course:   1. Asthma exacerbation: As mentioned above patient brought to the hospital was wheezing and dyspnea, also patient had some nausea and watery diarrhea. Her respiratory symptoms thought to be secondary to asthma exacerbation and acute bronchitis. Patient was started on IV steroids, inhaled bronchodilators, mucolytics and antitussives. Patient improved but slowly. On discharge prednisone taper was prescribed as well as albuterol for her nebulizer at home.   2. Acute bronchitis: Low-grade fever, purulent sputum as well as asthma exacerbation  secondary to it. Patient was on Levaquin, on discharge Levaquin for 5 more days prescribed.  3. Viral gastroenteritis: Patient came in with severe nausea and diarrhea, this is thought to be secondary to transient viral gastroenteritis, treated with supportive management with IV fluids for hydration and antiemetics patient did very well and she tolerating her diet since yesterday.  4. Uncontrolled diabetes mellitus type 2: Hemoglobin A1c of 8.9 which correlate with mean plasma glucose of 209. Patient was supposed to be on 500 mg of metformin and 20 units of Lantus. Patient said since 2 months ago she was unable to afford the Lantus secondary to loss of insurance. Patient was placed on 70/30 insulin mix and metformin dose increased to 1000 mg twice a day. Patient instructed to check her blood sugar at least twice a day and reports her primary care physician. Expected blood sugar to be in the high side while she is taking the prednisone.  5. Morbid obesity/OSA: Patient counseled about weight loss, she is on CPAP at bedtime.  6. HTN: Preadmission oral medication continued throughout the hospital stay.  Procedures:  None  Consultations:  None  Discharge Exam: Filed Vitals:   05/01/13 0515  BP: 125/63  Pulse: 71  Temp: 98.1 F (36.7 C)  Resp: 18   General: Alert and awake, oriented x3, not in any acute distress. HEENT: anicteric sclera, pupils reactive to light and accommodation, EOMI CVS: S1-S2 clear, no murmur rubs or gallops Chest:  faint expiratory wheezes  Abdomen: soft nontender, nondistended, normal bowel sounds, no organomegaly Extremities: no cyanosis, clubbing or edema noted bilaterally Neuro: Cranial nerves II-XII intact, no focal neurological deficits  Discharge Instructions   Future Appointments Provider  Department Dept Phone   05/10/2013 11:45 AM [facility] Covering Provider [facility] [phone]       Medication List    STOP taking  these medications       insulin glargine 100 UNIT/ML injection  Commonly known as:  LANTUS      TAKE these medications       acetaminophen 500 MG tablet  Commonly known as:  TYLENOL  Take 1,000 mg by mouth every 6 (six) hours as needed for pain.     albuterol (2.5 MG/3ML) 0.083% nebulizer solution  Commonly known as:  PROVENTIL  Take 3 mLs (2.5 mg total) by nebulization every 6 (six) hours as needed for wheezing.     albuterol 108 (90 BASE) MCG/ACT inhaler  Commonly known as:  PROVENTIL HFA;VENTOLIN HFA  Inhale 2 puffs into the lungs every 6 (six) hours as needed for wheezing.     aspirin EC 325 MG tablet  Take 325 mg by mouth daily.     benazepril-hydrochlorthiazide 20-25 MG per tablet  Commonly known as:  LOTENSIN HCT  Take 1 tablet by mouth daily.     budesonide-formoterol 160-4.5 MCG/ACT inhaler  Commonly known as:  SYMBICORT  Inhale 2 puffs into the lungs every other day.     gabapentin 300 MG capsule  Commonly known as:  NEURONTIN  Take 300 mg by mouth 3 (three) times daily.     ibuprofen 200 MG tablet  Commonly known as:  ADVIL,MOTRIN  Take 400 mg by mouth every 6 (six) hours as needed for pain.     insulin NPH-regular (70-30) 100 UNIT/ML injection  Commonly known as:  RELION 70/30  Inject 30 Units into the skin 2 (two) times daily with a meal.     levofloxacin 750 MG tablet  Commonly known as:  LEVAQUIN  Take 1 tablet (750 mg total) by mouth daily.     metFORMIN 500 MG tablet  Commonly known as:  GLUCOPHAGE  Take 2 tablets (1,000 mg total) by mouth 2 (two) times daily with a meal.     predniSONE 10 MG tablet  Commonly known as:  DELTASONE  Take 6-5-4-3-2-1 tablets PO daily till gone       No Known Allergies     Follow-up Information   Follow up with [facility] On 05/10/2013. (11:45am)    Contact information:   [address] [place] [address] phone [phone]       The results of significant diagnostics  from this hospitalization (including imaging, microbiology, ancillary and laboratory) are listed below for reference.    Significant Diagnostic Studies: Dg Chest Port 1 View  04/27/2013   *RADIOLOGY REPORT*  Clinical Data: Shortness of breath, cough, follow-up  PORTABLE CHEST - 1 VIEW  Comparison: Chest x-ray of 03/18/2013  Findings: The lungs are clear.  No infiltrate or effusion is seen. Mediastinal contours are stable.  The heart is within normal limits in size.  No bony abnormality is seen.  IMPRESSION: No active lung disease.  Stable chest x-ray.   Original Report Authenticated By: Dwyane Dee, M.D.    Microbiology: Recent Results (from the past 240 hour(s))  MRSA PCR SCREENING     Status: None   Collection Time    04/27/13  2:10 PM      Result Value Range Status   MRSA by PCR NEGATIVE  NEGATIVE Final   Comment:  The GeneXpert MRSA Assay (FDA     approved for NASAL specimens     only), is one component of a     comprehensive MRSA colonization     surveillance program. It is not     intended to diagnose MRSA     infection nor to guide or     monitor treatment for     MRSA infections.     Labs: Basic Metabolic Panel:  Recent Labs Lab 04/27/13 0812 04/29/13 0754  NA 136 139  K 3.6 3.4*  CL 102 108  CO2 18* 21  GLUCOSE 213* 194*  BUN 9 13  CREATININE 0.87 0.97  CALCIUM 9.4 8.8   Liver Function Tests: No results found for this basename: AST, ALT, ALKPHOS, BILITOT, PROT, ALBUMIN,  in the last 168 hours No results found for this basename: LIPASE, AMYLASE,  in the last 168 hours No results found for this basename: AMMONIA,  in the last 168 hours CBC:  Recent Labs Lab 04/27/13 0812 04/28/13 0450  WBC 11.4* 14.9*  HGB 13.8 12.3  HCT 38.4 35.4*  MCV 85.3 85.3  PLT 276 279   Cardiac Enzymes: No results found for this basename: CKTOTAL, CKMB, CKMBINDEX, TROPONINI,  in the last 168 hours BNP: BNP (last 3 results) No results found for this basename: PROBNP,   in the last 8760 hours CBG:  Recent Labs Lab 04/30/13 1156 04/30/13 1406 04/30/13 1622 04/30/13 2059 05/01/13 0732  GLUCAP 339* 284* 245* 139* 101*       Signed:  Ebba Goll A  [facility] 05/01/2013, 10:56 AM

## 2013-05-01 NOTE — Progress Notes (Signed)
Ambulatory in hallway during evening. Sleeping through night without distress.

## 2013-05-01 NOTE — Progress Notes (Signed)
   CARE MANAGEMENT NOTE 05/01/2013  Patient:  TIAUNNA, BUFORD   Account Number:  [id number]  Date Initiated:  04/27/2013  Documentation initiated by:  Junius Creamer  Subjective/Objective Assessment:   adm w asthma exacerb     Action/Plan:   lives w fam,  pt has no pcp at present since she lost her medicaid.   Anticipated DC Date:  05/01/2013   Anticipated DC Plan:  HOME/SELF CARE      DC Planning Services  CM consult  MATCH Program      Choice offered to / List presented to:             Status of service:  Completed, signed off Medicare Important Message given?   (If response is "NO", the following Medicare IM given date fields will be blank) Date Medicare IM given:   Date Additional Medicare IM given:    Discharge Disposition:  HOME/SELF CARE  Per UR Regulation:  Reviewed for med. necessity/level of care/duration of stay  If discussed at Long Length of Stay Meetings, dates discussed:    Comments:  05-01-13 10:45  Met with patient and gave [facility] handout (directions and phone numbers) and she stated she already had a followup appt at the [facility].  MATCH card and Match participating pharmacy list was given to pt and explained card expires in 1 week and MATCH is only available once in a calendar year.  Pt stated she understands and no other CM needs were communicated.  Freddy Jaksch, BSN, CM  8/7 1420 debbie dowell rn,bsn spoke w pt and gave her primary care resource list. pt [facility]able to [facility]. have faxed over to see if we can get appt to establish pt so she will have pcp. gave her 2 prescription discount cards. will cont to follow.

## 2013-05-01 NOTE — Progress Notes (Signed)
Discharge instructions explained to pt and a copy of instructions were given to pt along w/ her prescriptions.  Pt verbalized understanding of instructions.

## 2013-05-01 NOTE — Progress Notes (Signed)
Resp Care Note;Pt refusing CPAP. 

## 2013-05-10 ENCOUNTER — Inpatient Hospital Stay: Payer: Self-pay

## 2013-05-19 ENCOUNTER — Ambulatory Visit: Payer: Self-pay | Attending: Internal Medicine | Admitting: Internal Medicine

## 2013-05-19 VITALS — BP 150/113 | HR 105 | Temp 98.8°F | Resp 18 | Ht 61.0 in | Wt 243.0 lb

## 2013-05-19 DIAGNOSIS — J45901 Unspecified asthma with (acute) exacerbation: Secondary | ICD-10-CM

## 2013-05-19 DIAGNOSIS — J4531 Mild persistent asthma with (acute) exacerbation: Secondary | ICD-10-CM

## 2013-05-19 DIAGNOSIS — IMO0001 Reserved for inherently not codable concepts without codable children: Secondary | ICD-10-CM | POA: Insufficient documentation

## 2013-05-19 DIAGNOSIS — J45909 Unspecified asthma, uncomplicated: Secondary | ICD-10-CM | POA: Insufficient documentation

## 2013-05-19 MED ORDER — GABAPENTIN 300 MG PO CAPS: 300.0000 mg | ORAL_CAPSULE | Freq: Three times a day (TID) | ORAL | Status: DC

## 2013-05-19 MED ORDER — ALBUTEROL SULFATE (2.5 MG/3ML) 0.083% IN NEBU: 2.5000 mg | INHALATION_SOLUTION | Freq: Four times a day (QID) | RESPIRATORY_TRACT | Status: DC | PRN

## 2013-05-19 MED ORDER — METFORMIN HCL 500 MG PO TABS: 1000.0000 mg | ORAL_TABLET | Freq: Two times a day (BID) | ORAL | Status: DC

## 2013-05-19 MED ORDER — BUDESONIDE-FORMOTEROL FUMARATE 160-4.5 MCG/ACT IN AERO: 2.0000 | INHALATION_SPRAY | RESPIRATORY_TRACT | Status: DC

## 2013-05-19 MED ORDER — BENAZEPRIL-HYDROCHLOROTHIAZIDE 20-25 MG PO TABS: 1.0000 | ORAL_TABLET | Freq: Every day | ORAL | Status: DC

## 2013-05-19 MED ORDER — INSULIN NPH ISOPHANE & REGULAR (70-30) 100 UNIT/ML ~~LOC~~ SUSP: 30.0000 [IU] | Freq: Two times a day (BID) | SUBCUTANEOUS | Status: DC

## 2013-05-19 MED ORDER — ALBUTEROL SULFATE HFA 108 (90 BASE) MCG/ACT IN AERS: 2.0000 | INHALATION_SPRAY | Freq: Four times a day (QID) | RESPIRATORY_TRACT | Status: DC | PRN

## 2013-05-19 NOTE — Progress Notes (Signed)
Patient ID: [name], female   DOB: 01-Jul-1971, 42 y.o.   MRN: [id number]  CC: follow up   HPI: Pt is 42 yo female who presents to clinic for regular follow up and needs refill on her medications. She denies chest pain or shortness of breath, no abdominal or urinary concerns, she reports compliance with her medications but has ran out of the nebulizer. She denies fevers, chills, trying to avoid potential asthma triggers. She has hardwood floor at home and no pats.   No Known Allergies Past Medical History  Diagnosis Date  . DM (diabetes mellitus)   . Hypertension   . Hypokalemia   . Asthma    Current Outpatient Prescriptions on File Prior to Visit  Medication Sig Dispense Refill  . acetaminophen (TYLENOL) 500 MG tablet Take 1,000 mg by mouth every 6 (six) hours as needed for pain.      Marland Kitchen aspirin EC 325 MG tablet Take 325 mg by mouth daily.      Marland Kitchen ibuprofen (ADVIL,MOTRIN) 200 MG tablet Take 400 mg by mouth every 6 (six) hours as needed for pain.      Marland Kitchen levofloxacin (LEVAQUIN) 750 MG tablet Take 1 tablet (750 mg total) by mouth daily.  5 tablet  0  . predniSONE (DELTASONE) 10 MG tablet Take 6-5-4-3-2-1 tablets PO daily till gone  21 tablet  0   No current facility-administered medications on file prior to visit.   Family History  Problem Relation Age of Onset  . Heart attack Mother   . Stroke Mother   . Diabetes Mother   . Hypertension Mother   . Arthritis Mother   . Stroke Father   . Hypertension Sister   . Diabetes Sister   . Seizures Brother   . Diabetes Brother    History   Social History  . Marital Status: Married    Spouse Name: N/A    Number of Children: N/A  . Years of Education: N/A   Occupational History  . Not on file.   Social History Main Topics  . Smoking status: Former Smoker -- 0.30 packs/day for 22 years    Types: Cigarettes    Quit date: 05/01/2011  . Smokeless tobacco: Never Used  . Alcohol Use: Yes     Comment: rare  . Drug  Use: No  . Sexual Activity: Not on file   Other Topics Concern  . Not on file   Social History Narrative  . No narrative on file    Review of Systems  Constitutional: Negative for fever, chills, diaphoresis, activity change, appetite change and fatigue.  HENT: Negative for ear pain, nosebleeds, congestion, facial swelling, rhinorrhea, neck pain, neck stiffness and ear discharge.   Eyes: Negative for pain, discharge, redness, itching and visual disturbance.  Respiratory: Negative for cough, choking, chest tightness, shortness of breath, wheezing and stridor.   Cardiovascular: Negative for chest pain, palpitations and leg swelling.  Gastrointestinal: Negative for abdominal distention.  Genitourinary: Negative for dysuria, urgency, frequency, hematuria, flank pain, decreased urine volume, difficulty urinating and dyspareunia.  Musculoskeletal: Negative for back pain, joint swelling, arthralgias and gait problem.  Neurological: Negative for dizziness, tremors, seizures, syncope, facial asymmetry, speech difficulty, weakness, light-headedness, numbness and headaches.  Hematological: Negative for adenopathy. Does not bruise/bleed easily.  Psychiatric/Behavioral: Negative for hallucinations, behavioral problems, confusion, dysphoric mood, decreased concentration and agitation.    Objective:   Filed Vitals:   05/19/13 1454  BP: 150/113  Pulse: 105  Temp: 98.8 F (  37.1 C)  Resp: 18    Physical Exam  Constitutional: Appears well-developed and well-nourished. No distress.  Neck: Normal ROM. Neck supple. No JVD. No tracheal deviation. No thyromegaly.  CVS: RRR, S1/S2 +, no murmurs, no gallops, no carotid bruit.  Pulmonary: Effort and breath sounds normal, no stridor, rhonchi, wheezes, rales.  Abdominal: Soft. BS +,  no distension, tenderness, rebound or guarding.  Psychiatric: Normal mood and affect. Behavior, judgment, thought content normal.   Lab Results  Component Value Date    WBC 14.9* 04/28/2013   HGB 12.3 04/28/2013   HCT 35.4* 04/28/2013   MCV 85.3 04/28/2013   PLT 279 04/28/2013   Lab Results  Component Value Date   CREATININE 0.97 04/29/2013   BUN 13 04/29/2013   NA 139 04/29/2013   K 3.4* 04/29/2013   CL 108 04/29/2013   CO2 21 04/29/2013    Lab Results  Component Value Date   HGBA1C 8.9* 04/27/2013   Lipid Panel     Component Value Date/Time   CHOL 176 05/05/2011 0215   TRIG 56 05/05/2011 0215   HDL 71 05/05/2011 0215   CHOLHDL 2.5 05/05/2011 0215   VLDL 11 05/05/2011 0215   LDLCALC 94 05/05/2011 0215       Assessment and plan:   Patient Active Problem List   Diagnosis Date Noted  . Asthma exacerbation - now resolved, pt has completed the course of prednisone and ABX, clinically stable. We have discussed asthma control and avoiding triggers.  04/27/2013  . Uncontrolled diabetes mellitus - last A1C 8.9 in August 2014, next scheduled in 2 months, we have discussed importance of regular CBG checks and importance of daily foot examination for open sores and wounds  04/27/2013

## 2013-05-19 NOTE — Progress Notes (Signed)
PT HERE HFU  HX DIABETES,HTN,MIGRAINES,OBESITY CBG YESTERDAY 309 A1C 2 WEEKS AGO 8.9

## 2013-05-19 NOTE — Patient Instructions (Addendum)
Asthma Attack Prevention  HOW CAN ASTHMA BE PREVENTED?  Currently, there is no way to prevent asthma from starting. However, you can take steps to control the disease and prevent its symptoms after you have been diagnosed. Learn about your asthma and how to control it. Take an active role to control your asthma by working with your caregiver to create and follow an asthma action plan. An asthma action plan guides you in taking your medicines properly, avoiding factors that make your asthma worse, tracking your level of asthma control, responding to worsening asthma, and seeking emergency care when needed. To track your asthma, keep records of your symptoms, check your peak flow number using a peak flow meter (handheld device that shows how well air moves out of your lungs), and get regular asthma checkups.   Other ways to prevent asthma attacks include:  · Use medicines as your caregiver directs.  · Identify and avoid things that make your asthma worse (as much as you can).  · Keep track of your asthma symptoms and level of control.  · Get regular checkups for your asthma.  · With your caregiver, write a detailed plan for taking medicines and managing an asthma attack. Then be sure to follow your action plan. Asthma is an ongoing condition that needs regular monitoring and treatment.  · Identify and avoid asthma triggers. A number of outdoor allergens and irritants (pollen, mold, cold air, air pollution) can trigger asthma attacks. Find out what causes or makes your asthma worse, and take steps to avoid those triggers.  · Monitor your breathing. Learn to recognize warning signs of an attack, such as slight coughing, wheezing or shortness of breath. However, your lung function may already decrease before you notice any signs or symptoms, so regularly measure and record your peak airflow with a home peak flow meter.  · Identify and treat attacks early. If you act quickly, you're less likely to have a severe attack.  You will also need less medicine to control your symptoms. When your peak flow measurements decrease and alert you to an upcoming attack, take your medicine as instructed, and immediately stop any activity that may have triggered the attack. If your symptoms do not improve, get medical help.  · Pay attention to increasing quick-relief inhaler use. If you find yourself relying on your quick-relief inhaler (such as albuterol), your asthma is not under control. See your caregiver about adjusting your treatment.  IDENTIFY AND CONTROL FACTORS THAT MAKE YOUR ASTHMA WORSE  A number of common things can set off or make your asthma symptoms worse (asthma triggers). Keep track of your asthma symptoms for several weeks, detailing all the environmental and emotional factors that are linked with your asthma. When you have an asthma attack, go back to your asthma diary to see which factor, or combination of factors, might have contributed to it. Once you know what these factors are, you can take steps to control many of them.   Allergies:  If you have allergies and asthma, it is important to take asthma prevention steps at home. Asthma attacks (worsening of asthma symptoms) can be triggered by allergies, which can cause temporary increased inflammation of your airways. Minimizing contact with the substance to which you are allergic will help prevent an asthma attack.  Animal Dander:   · Some people are allergic to the flakes of skin or dried saliva from animals with fur or feathers. Keep these pets out of your home.  · If you   can't keep a pet outdoors, keep the pet out of your bedroom and other sleeping areas at all times, and keep the door closed.  · Remove carpets and furniture covered with cloth from your home. If that is not possible, keep the pet away from fabric-covered furniture and carpets.  Dust Mites:  · Many people with asthma are allergic to dust mites. Dust mites are tiny bugs that are found in every home, in  mattresses, pillows, carpets, fabric-covered furniture, bedcovers, clothes, stuffed toys, fabric, and other fabric-covered items.  · Cover your mattress in a special dust-proof cover.  · Cover your pillow in a special dust-proof cover, or wash the pillow each week in hot water. Water must be hotter than 130° F to kill dust mites. Cold or warm water used with detergent and bleach can also be effective.  · Wash the sheets and blankets on your bed each week in hot water.  · Try not to sleep or lie on cloth-covered cushions.  · Call ahead when traveling and ask for a smoke-free hotel room. Bring your own bedding and pillows, in case the hotel only supplies feather pillows and down comforters, which may contain dust mites and cause asthma symptoms.  · Remove carpets from your bedroom and those laid on concrete, if you can.  · Keep stuffed toys out of the bed, or wash the toys weekly in hot water or cooler water with detergent and bleach.  Cockroaches:  · Many people with asthma are allergic to the droppings and remains of cockroaches.  · Keep food and garbage in closed containers. Never leave food out.  · Use poison baits, traps, powders, gels, or paste (for example, boric acid).  · If a spray is used to kill cockroaches, stay out of the room until the odor goes away.  Indoor Mold:  · Fix leaky faucets, pipes, or other sources of water that have mold around them.  · Clean floors and moldy surfaces with a fungicide or diluted bleach.  · Avoid using humidifiers, vaporizers, or swamp coolers. These can spread molds through the air.  Pollen and Outdoor Mold:  · When pollen or mold spore counts are high, try to keep your windows closed.  · Stay indoors with windows closed from late morning to afternoon, if you can. Pollen and some mold spore counts are highest at that time.  · Ask your caregiver whether you need to take or increase anti-inflammatory medicine before your allergy season starts.  Irritants:   · Tobacco smoke is  an irritant. If you smoke, ask your caregiver how you can quit. Ask family members to quit smoking, too. Do not allow smoking in your home or car.  · If possible, do not use a wood-burning stove, kerosene heater, or fireplace. Minimize exposure to all sources of smoke, including incense, candles, fires, and fireworks.  · Try to stay away from strong odors and sprays, such as perfume, talcum powder, hair spray, and paints.  · Decrease humidity in your home and use an indoor air cleaning device. Reduce indoor humidity to below 60 percent. Dehumidifiers or central air conditioners can do this.  · Decrease house dust exposure by changing furnace and air cooler filters frequently.  · Try to have someone else vacuum for you once or twice a week, if you can. Stay out of rooms while they are being vacuumed and for a short while afterward.  · If you vacuum, use a dust mask from a hardware store, a   double-layered or microfilter vacuum cleaner bag, or a vacuum cleaner with a HEPA filter.  · Sulfites in foods and beverages can be irritants. Do not drink beer or wine, or eat dried fruit, processed potatoes, or shrimp if they cause asthma symptoms.  · Cold air can trigger an asthma attack. Cover your nose and mouth with a scarf on cold or windy days.  · Several health conditions can make asthma more difficult to manage, including runny nose, sinus infections, reflux disease, psychological stress, and sleep apnea. Your caregiver will treat these conditions, as well.  · Avoid close contact with people who have a cold or the flu, since your asthma symptoms may get worse if you catch the infection from them. Wash your hands thoroughly after touching items that may have been handled by people with a respiratory infection.  · Get a flu shot every year to protect against the flu virus, which often makes asthma worse for days or weeks. Also get a pneumonia shot once every 5 10 years.  Medicines:  · Aspirin and other pain relievers can  cause asthma attacks. Ten percent to 20% of people with asthma have sensitivity to aspirin or a group of pain relievers called non-steroidal anti-inflammatory medicines (NSAIDS), such as ibuprofen and naproxen. These medicines are used to treat pain and reduce fevers. Asthma attacks caused by any of these medicines can be severe and even fatal. These medicines must be avoided in people who have known aspirin sensitive asthma. Products with acetaminophen are considered safe for people who have asthma. It is important that people with aspirin sensitivity read labels of all over-the-counter medicines used to treat pain, colds, coughs, and fever.  · Beta blockers and ACE inhibitors are other medicines which you should discuss with your caregiver, in relation to your asthma.  ALLERGY SKIN TESTING   Ask your asthma caregiver about allergy skin testing or blood testing (RAST test) to identify the allergens to which you are sensitive. If you are found to have allergies, allergy shots (immunotherapy) for asthma may help prevent future allergies and asthma. With allergy shots, small doses of allergens (substances to which you are allergic) are injected under your skin on a regular schedule. Over a period of time, your body may become used to the allergen and less responsive with asthma symptoms. You can also take measures to minimize your exposure to those allergens.  EXERCISE   If you have exercise-induced asthma, or are planning vigorous exercise, or exercise in cold, humid, or dry environments, prevent exercise-induced asthma by following your caregiver's advice regarding asthma treatment before exercising.  Document Released: 08/27/2009 Document Revised: 08/25/2012 Document Reviewed: 08/27/2009  [employer]® Patient Information ©2014 [employer].

## 2013-07-19 ENCOUNTER — Ambulatory Visit: Payer: Self-pay

## 2013-08-03 ENCOUNTER — Ambulatory Visit: Payer: Self-pay | Attending: Internal Medicine | Admitting: Internal Medicine

## 2013-08-03 VITALS — BP 157/103 | HR 83 | Temp 98.0°F | Resp 17 | Wt 239.0 lb

## 2013-08-03 DIAGNOSIS — M545 Low back pain, unspecified: Secondary | ICD-10-CM | POA: Insufficient documentation

## 2013-08-03 DIAGNOSIS — E1310 Other specified diabetes mellitus with ketoacidosis without coma: Secondary | ICD-10-CM

## 2013-08-03 DIAGNOSIS — I10 Essential (primary) hypertension: Secondary | ICD-10-CM | POA: Insufficient documentation

## 2013-08-03 DIAGNOSIS — E119 Type 2 diabetes mellitus without complications: Secondary | ICD-10-CM | POA: Insufficient documentation

## 2013-08-03 DIAGNOSIS — E1110 Type 2 diabetes mellitus with ketoacidosis without coma: Secondary | ICD-10-CM

## 2013-08-03 LAB — POCT URINE PREGNANCY: Preg Test, Ur: NEGATIVE

## 2013-08-03 MED ORDER — TRAMADOL HCL 50 MG PO TABS: 50.0000 mg | ORAL_TABLET | Freq: Four times a day (QID) | ORAL | Status: DC | PRN

## 2013-08-03 MED ORDER — INSULIN NPH ISOPHANE & REGULAR (70-30) 100 UNIT/ML ~~LOC~~ SUSP: 40.0000 [IU] | Freq: Two times a day (BID) | SUBCUTANEOUS | Status: DC

## 2013-08-03 MED ORDER — FREESTYLE SYSTEM KIT: 1.0000 | PACK | Freq: Three times a day (TID) | Status: DC

## 2013-08-03 MED ORDER — AMLODIPINE BESYLATE 10 MG PO TABS: 10.0000 mg | ORAL_TABLET | Freq: Every day | ORAL | Status: DC

## 2013-08-03 MED ORDER — METFORMIN HCL 1000 MG PO TABS: 1000.0000 mg | ORAL_TABLET | Freq: Two times a day (BID) | ORAL | Status: DC

## 2013-08-03 MED ORDER — CARVEDILOL 3.125 MG PO TABS: 3.1250 mg | ORAL_TABLET | Freq: Two times a day (BID) | ORAL | Status: DC

## 2013-08-03 NOTE — Progress Notes (Signed)
Patient here for follow up Complains of pain in back And headache Today blood pressure is elevated

## 2013-08-03 NOTE — Progress Notes (Signed)
Patient ID: [name], female   DOB: [birth year], 42 y.o.   MRN: [id number]  Patient Demographics  [name], is a 42 y.o. female  [id number]  [id number]  DOB - 01-11-1971  Chief Complaint  Patient presents with  . Follow-up        Subjective:   [name] with History of type or tension, diabetes mellitus type 2, chronic intermittent lower back pain  is here for followup for diabetes mellitus and hypertension, she recently had exacerbation of her lower back pain which is on both sides, no radiation to legs, no bowel bladder incontinence or leg weakness, no fever chills. No dysuria.  Denies any subjective complaints except as above, no active headache, no chest abdominal pain at this time, not short of breath. No focal weakness which is new.    Objective:    Patient Active Problem List   Diagnosis Date Noted  . Acute bronchitis 05/01/2013  . Asthma exacerbation 04/27/2013  . Acute respiratory failure with hypoxia 04/27/2013  . Uncontrolled diabetes mellitus 04/27/2013  . Obesity 04/27/2013  . Viral gastroenteritis 04/27/2013  . Allergic rhinitis, seasonal 08/11/2012  . Chronic cough 08/11/2012  . Sleep apnea, obstructive 12/04/2011     Filed Vitals:   08/03/13 1555  BP: 157/103  Pulse: 83  Temp: 98 F (36.7 C)  Resp: 17  Weight: 239 lb (108.41 kg)  SpO2: 100%     Exam   Awake Alert, Oriented X 3, No new F.N deficits, Normal affect [place].AT,PERRAL Supple Neck,No JVD, No cervical lymphadenopathy appriciated.  Symmetrical Chest wall movement, Good air movement bilaterally, CTAB RRR,No Gallops,Rubs or new Murmurs, No Parasternal Heave +ve B.Sounds, Abd Soft, Non tender, No organomegaly appriciated, No rebound - guarding or rigidity. No Cyanosis, Clubbing or edema, No new Rash or bruise   No point tenderness on lower spine, no lower extremity weakness. She has pain in both lower flank area. No tenderness on  palpation.    Data Review   Lab Results  Component Value Date   WBC 14.9* 04/28/2013   HGB 12.3 04/28/2013   HCT 35.4* 04/28/2013   MCV 85.3 04/28/2013   PLT 279 04/28/2013      Chemistry      Component Value Date/Time   NA 139 04/29/2013 0754   K 3.4* 04/29/2013 0754   CL 108 04/29/2013 0754   CO2 21 04/29/2013 0754   BUN 13 04/29/2013 0754   CREATININE 0.97 04/29/2013 0754      Component Value Date/Time   CALCIUM 8.8 04/29/2013 0754   ALKPHOS 97 08/08/2012 0823   AST 15 08/08/2012 0823   ALT 23 08/08/2012 0823   BILITOT 0.2* 08/08/2012 0823       Lab Results  Component Value Date   HGBA1C 8.9* 04/27/2013    Lab Results  Component Value Date   CHOL 176 05/05/2011   HDL 71 05/05/2011   LDLCALC 94 05/05/2011   TRIG 56 05/05/2011   CHOLHDL 2.5 05/05/2011    Lab Results  Component Value Date   TSH 0.735 05/04/2011    No results found for this basename: PSA      Prior to Admission medications   Medication Sig Start Date End Date Taking? Authorizing Provider  albuterol (PROVENTIL HFA;VENTOLIN HFA) 108 (90 BASE) MCG/ACT inhaler Inhale 2 puffs into the lungs every 6 (six) hours as needed for wheezing. 05/19/13   Dorothea Ogle, MD  albuterol (PROVENTIL) (2.5 MG/3ML) 0.083% nebulizer solution Take 3 mLs (  2.5 mg total) by nebulization every 6 (six) hours as needed for wheezing. 05/19/13   Dorothea Ogle, MD  amLODipine (NORVASC) 10 MG tablet Take 1 tablet (10 mg total) by mouth daily. 08/03/13   Leroy Sea, MD  aspirin EC 325 MG tablet Take 325 mg by mouth daily.    Historical Provider, MD  benazepril-hydrochlorthiazide (LOTENSIN HCT) 20-25 MG per tablet Take 1 tablet by mouth daily. 05/19/13   Dorothea Ogle, MD  budesonide-formoterol (SYMBICORT) 160-4.5 MCG/ACT inhaler Inhale 2 puffs into the lungs every other day. 05/19/13   Dorothea Ogle, MD  carvedilol (COREG) 3.125 MG tablet Take 1 tablet (3.125 mg total) by mouth 2 (two) times daily with a meal. 08/03/13   Leroy Sea, MD   gabapentin (NEURONTIN) 300 MG capsule Take 1 capsule (300 mg total) by mouth 3 (three) times daily. 05/19/13   Dorothea Ogle, MD  glucose monitoring kit (FREESTYLE) monitoring kit 1 each by Does not apply route 4 (four) times daily - after meals and at bedtime. 1 month Diabetic Testing Supplies for QAC-QHS accuchecks. 08/03/13   Leroy Sea, MD  insulin NPH-regular (RELION 70/30) (70-30) 100 UNIT/ML injection Inject 40 Units into the skin 2 (two) times daily with a meal. 08/03/13   Leroy Sea, MD  metFORMIN (GLUCOPHAGE) 1000 MG tablet Take 1 tablet (1,000 mg total) by mouth 2 (two) times daily with a meal. 08/03/13   Leroy Sea, MD  traMADol (ULTRAM) 50 MG tablet Take 1 tablet (50 mg total) by mouth every 6 (six) hours as needed for moderate pain. 08/03/13   Leroy Sea, MD     Assessment & Plan    Low back pain. Intermittent but chronic. No weakness or radiation to the legs, check x-ray of the spine and placed on Ultram.   Diabetes mellitus type 2. Poor control. CBG here to 90, we'll check A1c, continue Glucophage, will increase insulin 70-30 from 30 units twice a day to 40 twice a day. Testing supplies given. Requested to check CBGs q. a.c. at bedtime and maintain a logbook. Counseled on diet exercise.   Hypertension. Poor control. Continue present dose ACE inhibitor and diuretic, added Coreg and Norvasc. We'll get her back in 2 weeks to monitor.    Morbid obesity. Counseled for diet and exercise.     Routine health maintenance.  Referral Made to [facility] for mammogram and Pap smear   Leroy Sea M.D on 08/03/2013 at 4:10 PM

## 2013-08-04 LAB — URINALYSIS W MICROSCOPIC + REFLEX CULTURE
Bacteria, UA: NONE SEEN
Casts: NONE SEEN
Crystals: NONE SEEN
Glucose, UA: >1000 mg/dL — AB
Hgb urine dipstick: NEGATIVE
Ketones, ur: NEGATIVE mg/dL
Leukocytes, UA: NEGATIVE
Specific Gravity, Urine: >1.03 — ABNORMAL HIGH (ref 1.005–1.030)
pH: 5 (ref 5.0–8.0)

## 2013-08-15 ENCOUNTER — Ambulatory Visit (HOSPITAL_COMMUNITY)
Admission: RE | Admit: 2013-08-15 | Discharge: 2013-08-15 | Disposition: A | Discharge status: Home / Self Care | Payer: Self-pay | Source: Ambulatory Visit | Attending: Internal Medicine | Admitting: Internal Medicine

## 2013-08-15 DIAGNOSIS — E1110 Type 2 diabetes mellitus with ketoacidosis without coma: Secondary | ICD-10-CM

## 2013-08-15 DIAGNOSIS — M545 Low back pain, unspecified: Secondary | ICD-10-CM | POA: Insufficient documentation

## 2013-08-16 ENCOUNTER — Telehealth: Payer: Self-pay | Admitting: Internal Medicine

## 2013-08-16 ENCOUNTER — Ambulatory Visit: Payer: Self-pay | Admitting: Internal Medicine

## 2013-08-16 NOTE — Telephone Encounter (Signed)
Please call patient with blood work results as well as xray results.

## 2013-08-16 NOTE — Telephone Encounter (Signed)
Please call patient with blood work and xray results.

## 2013-08-16 NOTE — Telephone Encounter (Signed)
Pt given test results. Scheduled lab visit for blood sugar 08/24/13

## 2013-08-24 ENCOUNTER — Ambulatory Visit: Payer: Self-pay | Attending: Internal Medicine

## 2013-08-24 ENCOUNTER — Other Ambulatory Visit: Payer: Self-pay | Admitting: Internal Medicine

## 2013-08-24 DIAGNOSIS — E1310 Other specified diabetes mellitus with ketoacidosis without coma: Secondary | ICD-10-CM

## 2013-08-24 DIAGNOSIS — E1165 Type 2 diabetes mellitus with hyperglycemia: Secondary | ICD-10-CM

## 2013-08-24 DIAGNOSIS — E1110 Type 2 diabetes mellitus with ketoacidosis without coma: Secondary | ICD-10-CM

## 2013-08-24 LAB — GLUCOSE, POCT (MANUAL RESULT ENTRY): POC Glucose: 282 mg/dl — AB (ref 70–99)

## 2013-08-24 NOTE — Progress Notes (Unsigned)
Pt here for CBG recheck s/p hyperglycemic  episode

## 2013-08-24 NOTE — Progress Notes (Unsigned)
Patient was seen in the clinic today as a walk-in for blood sugar check. Her blood sugars run in the high 200s mostly in the evenings.  Plan: Increase morning dose of insulin 70/30 to 50 units from 40 Patient has been educated on hypoglycemic precautions Ambulatory referral to diabetic education Patient was counseled extensively on nutrition and exercise

## 2013-08-24 NOTE — Patient Instructions (Signed)
Diabetes and Exercise Exercising regularly is important. It is not just about losing weight. It has many health benefits, such as:  Improving your overall fitness, flexibility, and endurance.  Increasing your bone density.  Helping with weight control.  Decreasing your body fat.  Increasing your muscle strength.  Reducing stress and tension.  Improving your overall health. People with diabetes who exercise gain additional benefits because exercise:  Reduces appetite.  Improves the body's use of blood sugar (glucose).  Helps lower or control blood glucose.  Decreases blood pressure.  Helps control blood lipids (such as cholesterol and triglycerides).  Improves the body's use of the hormone insulin by:  Increasing the body's insulin sensitivity.  Reducing the body's insulin needs.  Decreases the risk for heart disease because exercising:  Lowers cholesterol and triglycerides levels.  Increases the levels of good cholesterol (such as high-density lipoproteins [HDL]) in the body.  Lowers blood glucose levels. YOUR ACTIVITY PLAN  Choose an activity that you enjoy and set realistic goals. Your health care provider or diabetes educator can help you make an activity plan that works for you. You can break activities into 2 or 3 sessions throughout the day. Doing so is as good as one long session. Exercise ideas include:  Taking the dog for a walk.  Taking the stairs instead of the elevator.  Dancing to your favorite song.  Doing your favorite exercise with a friend. RECOMMENDATIONS FOR EXERCISING WITH TYPE 1 OR TYPE 2 DIABETES   Check your blood glucose before exercising. If blood glucose levels are greater than 240 mg/dL, check for urine ketones. Do not exercise if ketones are present.  Avoid injecting insulin into areas of the body that are going to be exercised. For example, avoid injecting insulin into:  The arms when playing tennis.  The legs when  jogging.  Keep a record of:  Food intake before and after you exercise.  Expected peak times of insulin action.  Blood glucose levels before and after you exercise.  The type and amount of exercise you have done.  Review your records with your health care provider. Your health care provider will help you to develop guidelines for adjusting food intake and insulin amounts before and after exercising.  If you take insulin or oral hypoglycemic agents, watch for signs and symptoms of hypoglycemia. They include:  Dizziness.  Shaking.  Sweating.  Chills.  Confusion.  Drink plenty of water while you exercise to prevent dehydration or heat stroke. Body water is lost during exercise and must be replaced.  Talk to your health care provider before starting an exercise program to make sure it is safe for you. Remember, almost any type of activity is better than none. Document Released: 11/29/2003 Document Revised: 05/11/2013 Document Reviewed: 02/15/2013 Exit[employer] Patient Information 2014 [employer]. Diabetes Meal Planning Guide The diabetes meal planning guide is a tool to help you plan your meals and snacks. It is important for people with diabetes to manage their blood glucose (sugar) levels. Choosing the right foods and the right amounts throughout your day will help control your blood glucose. Eating right can even help you improve your blood pressure and reach or maintain a healthy weight. CARBOHYDRATE COUNTING MADE EASY When you eat carbohydrates, they turn to sugar. This raises your blood glucose level. Counting carbohydrates can help you control this level so you feel better. When you plan your meals by counting carbohydrates, you can have more flexibility in what you eat and balance your medicine   with your food intake. Carbohydrate counting simply means adding up the total amount of carbohydrate grams in your meals and snacks. Try to eat about the same amount at each meal. Foods  with carbohydrates are listed below. Each portion below is 1 carbohydrate serving or 15 grams of carbohydrates. Ask your dietician how many grams of carbohydrates you should eat at each meal or snack. Grains and Starches  1 slice bread.   English muffin or hotdog/hamburger bun.   cup cold cereal (unsweetened).   cup cooked pasta or rice.   cup starchy vegetables (corn, potatoes, peas, beans, winter squash).  1 tortilla (6 inches).   bagel.  1 waffle or pancake (size of a CD).   cup cooked cereal.  4 to 6 small crackers. *Whole grain is recommended. Fruit  1 cup fresh unsweetened berries, melon, papaya, pineapple.  1 small fresh fruit.   banana or mango.   cup fruit juice (4 oz unsweetened).   cup canned fruit in natural juice or water.  2 tbs dried fruit.  12 to 15 grapes or cherries. Milk and Yogurt  1 cup fat-free or 1% milk.  1 cup soy milk.  6 oz light yogurt with sugar-free sweetener.  6 oz low-fat soy yogurt.  6 oz plain yogurt. Vegetables  1 cup raw or  cup cooked is counted as 0 carbohydrates or a "free" food.  If you eat 3 or more servings at 1 meal, count them as 1 carbohydrate serving. Other Carbohydrates   oz chips or pretzels.   cup ice cream or frozen yogurt.   cup sherbet or sorbet.  2 inch square cake, no frosting.  1 tbs honey, sugar, jam, jelly, or syrup.  2 small cookies.  3 squares of graham crackers.  3 cups popcorn.  6 crackers.  1 cup broth-based soup.  Count 1 cup casserole or other mixed foods as 2 carbohydrate servings.  Foods with less than 20 calories in a serving may be counted as 0 carbohydrates or a "free" food. You may want to purchase a book or computer software that lists the carbohydrate gram counts of different foods. In addition, the nutrition facts panel on the labels of the foods you eat are a good source of this information. The label will tell you how big the serving size is and the  total number of carbohydrate grams you will be eating per serving. Divide this number by 15 to obtain the number of carbohydrate servings in a portion. Remember, 1 carbohydrate serving equals 15 grams of carbohydrate. SERVING SIZES Measuring foods and serving sizes helps you make sure you are getting the right amount of food. The list below tells how big or small some common serving sizes are.  1 oz.........4 stacked dice.  3 oz.........Deck of cards.  1 tsp........Tip of little finger.  1 tbs........Thumb.  2 tbs........Golf ball.   cup.......Half of a fist.  1 cup........A fist. SAMPLE DIABETES MEAL PLAN Below is a sample meal plan that includes foods from the grain and starches, dairy, vegetable, fruit, and meat groups. A dietician can individualize a meal plan to fit your calorie needs and tell you the number of servings needed from each food group. However, controlling the total amount of carbohydrates in your meal or snack is more important than making sure you include all of the food groups at every meal. You may interchange carbohydrate containing foods (dairy, starches, and fruits). The meal plan below is an example of a 2000 calorie diet   using carbohydrate counting. This meal plan has 17 carbohydrate servings. Breakfast  1 cup oatmeal (2 carb servings).   cup light yogurt (1 carb serving).  1 cup blueberries (1 carb serving).   cup almonds. Snack  1 large apple (2 carb servings).  1 low-fat string cheese stick. Lunch  Chicken breast salad.  1 cup spinach.   cup chopped tomatoes.  2 oz chicken breast, sliced.  2 tbs low-fat [place] dressing.  12 whole-wheat crackers (2 carb servings).  12 to 15 grapes (1 carb serving).  1 cup low-fat milk (1 carb serving). Snack  1 cup carrots.   cup hummus (1 carb serving). Dinner  3 oz broiled salmon.  1 cup brown rice (3 carb servings). Snack  1  cups steamed broccoli (1 carb serving) drizzled with 1  tsp olive oil and lemon juice.  1 cup light pudding (2 carb servings). DIABETES MEAL PLANNING WORKSHEET Your dietician can use this worksheet to help you decide how many servings of foods and what types of foods are right for you.  BREAKFAST Food Group and Servings / Carb Servings Grain/Starches __________________________________ Dairy __________________________________________ Vegetable ______________________________________ Fruit ___________________________________________ Meat __________________________________________ Fat ____________________________________________ LUNCH Food Group and Servings / Carb Servings Grain/Starches ___________________________________ Dairy ___________________________________________ Fruit ____________________________________________ Meat ___________________________________________ Fat _____________________________________________ [place] Food Group and Servings / Carb Servings Grain/Starches ___________________________________ Dairy ___________________________________________ Fruit ____________________________________________ Meat ___________________________________________ Fat _____________________________________________ SNACKS Food Group and Servings / Carb Servings Grain/Starches ___________________________________ Dairy ___________________________________________ Vegetable _______________________________________ Fruit ____________________________________________ Meat ___________________________________________ Fat _____________________________________________ DAILY TOTALS Starches _________________________ Vegetable ________________________ Fruit ____________________________ Dairy ____________________________ Meat ____________________________ Fat ______________________________ Document Released: 06/05/2005 Document Revised: 12/01/2011 Document Reviewed: 04/16/2009 [employer] Patient Information 2014 [employer]. Diets for Diabetes,  Food Labeling Look at food labels to help you decide how much of a product you can eat. You will want to check the amount of total carbohydrate in a serving to see how the food fits into your meal plan. In the list of ingredients, the ingredient present in the largest amount by weight must be listed first, followed by the other ingredients in descending order. STANDARD OF IDENTITY Most products have a list of ingredients. However, foods that the Food and Drug Administration (FDA) has given a standard of identity do not need a list of ingredients. A standard of identity means that a food must contain certain ingredients if it is called a particular name. Examples are mayonnaise, peanut butter, ketchup, jelly, and cheese. LABELING TERMS There are many terms found on food labels. Some of these terms have specific definitions. Some terms are regulated by the FDA, and the FDA has clearly specified how they can be used. Others are not regulated or well-defined and can be misleading and confusing. SPECIFICALLY DEFINED TERMS Nutritive Sweetener.  A sweetener that contains calories,such as table sugar or honey. Nonnutritive Sweetener.  A sweetener with few or no calories,such as saccharin, aspartame, sucralose, and cyclamate. LABELING TERMS REGULATED BY THE FDA Free.  The product contains only a tiny or small amount of fat, cholesterol, sodium, sugar, or calories. For example, a "fat-free" product will contain less than 0.5 g of fat per serving. Low.  A food described as "low" in fat, saturated fat, cholesterol, sodium, or calories could be eaten fairly often without exceeding dietary guidelines. For example, "low in fat" means no more than 3 g of fat per serving. Lean.  "Lean" and "extra lean" are U.S. Department of Agriculture Architect) terms for use on meat and poultry products. "Lean" means the product  contains less than 10 g of fat, 4 g of saturated fat, and 95 mg of cholesterol per serving. "Lean" is  not as low in fat as a product labeled "low." Extra Lean.  "Extra lean" means the product contains less than 5 g of fat, 2 g of saturated fat, and 95 mg of cholesterol per serving. While "extra lean" has less fat than "lean," it is still higher in fat than a product labeled "low." Reduced, Less, Fewer.  A diet product that contains 25% less of a nutrient or calories than the regular version. For example, hot dogs might be labeled "25% less fat than our regular hot dogs." Light/Lite.  A diet product that contains  fewer calories or  the fat of the original. For example, "light in sodium" means a product with  the usual sodium. More.  One serving contains at least 10% more of the daily value of a vitamin, mineral, or fiber than usual. Good Source Of.  One serving contains 10% to 19% of the daily value for a particular vitamin, mineral, or fiber. Excellent Source Of.  One serving contains 20% or more of the daily value for a particular nutrient. Other terms used might be "high in" or "rich in." Enriched or Fortified.  The product contains added vitamins, minerals, or protein. Nutrition labeling must be used on enriched or fortified foods. Imitation.  The product has been altered so that it is lower in protein, vitamins, or minerals than the usual food,such as imitation peanut butter. Total Fat.  The number listed is the total of all fat found in a serving of the product. Under total fat, food labels must list saturated fat and trans fat, which are associated with raising bad cholesterol and an increased risk of heart blood vessel disease. Saturated Fat.  Mainly fats from animal-based sources. Some examples are red meat, cheese, cream, whole milk, and coconut oil. Trans Fat.  Found in some fried snack foods, packaged foods, and fried restaurant foods. It is recommended you eat as close to 0 g of trans fat as possible, since it raises bad cholesterol and lowers good  cholesterol. Polyunsaturated and Monounsaturated Fats.  More healthful fats. These fats are from plant sources. Total Carbohydrate.  The number of carbohydrate grams in a serving of the product. Under total carbohydrate are listed the other carbohydrate sources, such as dietary fiber and sugars. Dietary Fiber.  A carbohydrate from plant sources. Sugars.  Sugars listed on the label contain all naturally occurring sugars as well as added sugars. LABELING TERMS NOT REGULATED BY THE FDA Sugarless.  Table sugar (sucrose) has not been added. However, the manufacturer may use another form of sugar in place of sucrose to sweeten the product. For example, sugar alcohols are used to sweeten foods. Sugar alcohols are a form of sugar but are not table sugar. If a product contains sugar alcohols in place of sucrose, it can still be labeled "sugarless." Low Salt, Salt-Free, Unsalted, No Salt, No Salt Added, Without Added Salt.  Food that is usually processed with salt has been made without salt. However, the food may contain sodium-containing additives, such as preservatives, leavening agents, or flavorings. Natural.  This term has no legal meaning. Organic.  Foods that are certified as organic have been inspected and approved by the USDA to ensure they are produced without pesticides, fertilizers containing synthetic ingredients, bioengineering, or ionizing radiation. Document Released: 09/11/2003 Document Revised: 12/01/2011 Document Reviewed: 03/29/2009 [facility] Patient Information 2014 [place], [place].

## 2013-08-25 ENCOUNTER — Ambulatory Visit: Payer: Self-pay | Admitting: Obstetrics & Gynecology

## 2013-08-29 ENCOUNTER — Ambulatory Visit: Payer: Self-pay | Admitting: Emergency Medicine

## 2013-09-05 ENCOUNTER — Encounter: Payer: Self-pay | Admitting: Internal Medicine

## 2013-09-05 ENCOUNTER — Ambulatory Visit: Payer: Self-pay | Attending: Internal Medicine | Admitting: Internal Medicine

## 2013-09-05 VITALS — BP 130/94 | HR 95 | Temp 99.4°F | Resp 16 | Ht 61.0 in | Wt 243.0 lb

## 2013-09-05 DIAGNOSIS — M549 Dorsalgia, unspecified: Secondary | ICD-10-CM

## 2013-09-05 DIAGNOSIS — E1165 Type 2 diabetes mellitus with hyperglycemia: Secondary | ICD-10-CM

## 2013-09-05 DIAGNOSIS — E669 Obesity, unspecified: Secondary | ICD-10-CM

## 2013-09-05 DIAGNOSIS — E119 Type 2 diabetes mellitus without complications: Secondary | ICD-10-CM

## 2013-09-05 DIAGNOSIS — IMO0002 Reserved for concepts with insufficient information to code with codable children: Secondary | ICD-10-CM

## 2013-09-05 DIAGNOSIS — M51369 Other intervertebral disc degeneration, lumbar region without mention of lumbar back pain or lower extremity pain: Secondary | ICD-10-CM | POA: Insufficient documentation

## 2013-09-05 DIAGNOSIS — M5126 Other intervertebral disc displacement, lumbar region: Secondary | ICD-10-CM | POA: Insufficient documentation

## 2013-09-05 DIAGNOSIS — I10 Essential (primary) hypertension: Secondary | ICD-10-CM

## 2013-09-05 LAB — POCT GLYCOSYLATED HEMOGLOBIN (HGB A1C): Hemoglobin A1C: 9.9

## 2013-09-05 MED ORDER — GABAPENTIN 400 MG PO CAPS: 400.0000 mg | ORAL_CAPSULE | Freq: Three times a day (TID) | ORAL | Status: DC

## 2013-09-05 MED ORDER — BENAZEPRIL-HYDROCHLOROTHIAZIDE 20-25 MG PO TABS: 1.0000 | ORAL_TABLET | Freq: Every day | ORAL | Status: DC

## 2013-09-05 MED ORDER — CARVEDILOL 3.125 MG PO TABS: 3.1250 mg | ORAL_TABLET | Freq: Two times a day (BID) | ORAL | Status: DC

## 2013-09-05 MED ORDER — DAPAGLIFLOZIN PROPANEDIOL 5 MG PO TABS: 5.0000 mg | ORAL_TABLET | Freq: Every day | ORAL | Status: DC

## 2013-09-05 MED ORDER — INSULIN NPH ISOPHANE & REGULAR (70-30) 100 UNIT/ML ~~LOC~~ SUSP: 50.0000 [IU] | Freq: Two times a day (BID) | SUBCUTANEOUS | Status: DC

## 2013-09-05 MED ORDER — CYCLOBENZAPRINE HCL 5 MG PO TABS
5.0000 mg | ORAL_TABLET | Freq: Three times a day (TID) | ORAL | Status: DC | PRN
Start: 2013-09-05 — End: 2014-04-07

## 2013-09-05 MED ORDER — ALBUTEROL SULFATE (2.5 MG/3ML) 0.083% IN NEBU: 2.5000 mg | INHALATION_SOLUTION | Freq: Four times a day (QID) | RESPIRATORY_TRACT | Status: DC | PRN

## 2013-09-05 MED ORDER — FREESTYLE SYSTEM KIT: 1.0000 | PACK | Freq: Three times a day (TID) | Status: DC

## 2013-09-05 MED ORDER — MELOXICAM 7.5 MG PO TABS: 7.5000 mg | ORAL_TABLET | Freq: Every day | ORAL | Status: DC

## 2013-09-05 MED ORDER — AMLODIPINE BESYLATE 10 MG PO TABS: 10.0000 mg | ORAL_TABLET | Freq: Every day | ORAL | Status: DC

## 2013-09-05 MED ORDER — TRAMADOL HCL 50 MG PO TABS: 50.0000 mg | ORAL_TABLET | Freq: Four times a day (QID) | ORAL | Status: DC | PRN

## 2013-09-05 MED ORDER — ASPIRIN EC 325 MG PO TBEC: 325.0000 mg | DELAYED_RELEASE_TABLET | Freq: Every day | ORAL | Status: DC

## 2013-09-05 NOTE — Progress Notes (Unsigned)
Patient ID: [name], female   DOB: [birth year], 42 y.o.   MRN: [id number] Patient Demographics  [name], is a 42 y.o. female  [id number]  [id number]  DOB - [birth year]  Chief Complaint  Patient presents with  . Follow-up        Subjective:   [name] today is here for a follow up visit. Patient is a 42 year old female with uncontrolled diabetes mellitus, here for followup appointment, blood sugar has been running high in 300s at home. Patient reports that she had been taking NPH 50 units in morning and 40 units at bedtime, metformin 1000 mg twice a day. States that she is also watching her diet Patient reports that had low back is hurting a lot and tramadol is not helping. She also is wondering if she can have hydrocodone.  Patient has No headache, No chest pain, No abdominal pain - No Nausea No Cough - SOB.  Objective:    Filed Vitals:   09/05/13 1211  BP: 130/94  Pulse: 95  Temp: 99.4 F (37.4 C)  TempSrc: Oral  Resp: 16  Height: 5\' 1"  (1.549 m)  Weight: 243 lb (110.224 kg)  SpO2: 91%     ALLERGIES:  No Known Allergies  PAST MEDICAL HISTORY: Past Medical History  Diagnosis Date  . DM (diabetes mellitus)   . Hypertension   . Hypokalemia   . Asthma     MEDICATIONS AT HOME: Prior to Admission medications   Medication Sig Start Date End Date Taking? Authorizing Provider  albuterol (PROVENTIL HFA;VENTOLIN HFA) 108 (90 BASE) MCG/ACT inhaler Inhale 2 puffs into the lungs every 6 (six) hours as needed for wheezing. 05/19/13  Yes Dorothea Ogle, MD  albuterol (PROVENTIL) (2.5 MG/3ML) 0.083% nebulizer solution Take 3 mLs (2.5 mg total) by nebulization every 6 (six) hours as needed for wheezing. 09/05/13  Yes Ripudeep Jenna Luo, MD  amLODipine (NORVASC) 10 MG tablet Take 1 tablet (10 mg total) by mouth daily. 09/05/13  Yes Ripudeep Jenna Luo, MD  aspirin EC 325 MG tablet Take 1 tablet (325 mg total) by mouth daily.  09/05/13  Yes Ripudeep Jenna Luo, MD  benazepril-hydrochlorthiazide (LOTENSIN HCT) 20-25 MG per tablet Take 1 tablet by mouth daily. 09/05/13  Yes Ripudeep Jenna Luo, MD  budesonide-formoterol (SYMBICORT) 160-4.5 MCG/ACT inhaler Inhale 2 puffs into the lungs every other day. 05/19/13  Yes Dorothea Ogle, MD  carvedilol (COREG) 3.125 MG tablet Take 1 tablet (3.125 mg total) by mouth 2 (two) times daily with a meal. 09/05/13  Yes Ripudeep K Rai, MD  gabapentin (NEURONTIN) 400 MG capsule Take 1 capsule (400 mg total) by mouth 3 (three) times daily. 09/05/13  Yes Ripudeep Jenna Luo, MD  glucose monitoring kit (FREESTYLE) monitoring kit 1 each by Does not apply route 4 (four) times daily - after meals and at bedtime. 1 month Diabetic Testing Supplies for QAC-QHS accuchecks. 09/05/13  Yes Ripudeep Jenna Luo, MD  insulin NPH-regular (RELION 70/30) (70-30) 100 UNIT/ML injection Inject 50 Units into the skin 2 (two) times daily with a meal. 09/05/13  Yes Ripudeep Jenna Luo, MD  metFORMIN (GLUCOPHAGE) 1000 MG tablet Take 1 tablet (1,000 mg total) by mouth 2 (two) times daily with a meal. 08/03/13  Yes Leroy Sea, MD  traMADol (ULTRAM) 50 MG tablet Take 1-2 tablets (50-100 mg total) by mouth every 6 (six) hours as needed for moderate pain. 09/05/13  Yes Ripudeep Jenna Luo, MD  cyclobenzaprine (FLEXERIL) 5 MG tablet  Take 1 tablet (5 mg total) by mouth 3 (three) times daily as needed for muscle spasms. 09/05/13   Ripudeep Jenna Luo, MD  Dapagliflozin Propanediol 5 MG TABS Take 5 mg by mouth daily. 09/05/13   Ripudeep Jenna Luo, MD  meloxicam (MOBIC) 7.5 MG tablet Take 1 tablet (7.5 mg total) by mouth daily. 09/05/13   Ripudeep Jenna Luo, MD     Exam  General appearance :Awake, alert, NAD, Speech Clear.  HEENT: Atraumatic and Normocephalic, PERLA Neck: supple, no JVD. No cervical lymphadenopathy.  Chest: Clear to auscultation bilaterally, no wheezing, rales or rhonchi CVS: S1 S2 regular, no murmurs.  Abdomen: soft, NBS, NT, ND, no  gaurding, rigidity or rebound. Extremities: no cyanosis or clubbing, B/L Lower Ext shows no edema Neurology: Awake alert, and oriented X 3, CN II-XII intact, Non focal Back: Low back pain, tenderness in the L4-L5 region Skin: No Rash or lesions Wounds:N/A    Data Review   Basic Metabolic Panel: No results found for this basename: NA, K, CL, CO2, GLUCOSE, BUN, CREATININE, CALCIUM, MG, PHOS,  in the last 168 hours Liver Function Tests: No results found for this basename: AST, ALT, ALKPHOS, BILITOT, PROT, ALBUMIN,  in the last 168 hours  CBC: No results found for this basename: WBC, NEUTROABS, HGB, HCT, MCV, PLT,  in the last 168 hours  ------------------------------------------------------------------------------------------------------------------  Recent Labs  09/05/13 1232  HGBA1C 9.9   ------------------------------------------------------------------------------------------------------------------ No results found for this basename: CHOL, HDL, LDLCALC, TRIG, CHOLHDL, LDLDIRECT,  in the last 72 hours ------------------------------------------------------------------------------------------------------------------ No results found for this basename: TSH, T4TOTAL, FREET3, T3FREE, THYROIDAB,  in the last 72 hours ------------------------------------------------------------------------------------------------------------------ No results found for this basename: VITAMINB12, FOLATE, FERRITIN, TIBC, IRON, RETICCTPCT,  in the last 72 hours  Coagulation profile  No results found for this basename: INR, PROTIME,  in the last 168 hours    Assessment & Plan   Active Problems: Acute on chronic back pain - Obtain MRI of the lumbar spine - Continue tramadol as needed, started on Flexeril and Mobic   Uncontrolled diabetes mellitus - Continue metformin 1000 twice a day, increase NPH to 50 units twice a day - Added Farxiga 5 mg daily  Health screening - States that she has  received flu shot last visit - She has [facility]  appointment next month for Pap smear and mammogram   Recommendations: MRI of the lumbar spine    Follow-up in 4 weeks     RAI,RIPUDEEP M.D. 09/05/2013, 12:34 PM

## 2013-09-05 NOTE — Progress Notes (Unsigned)
Pt is here following up on her acute lower back pain.

## 2013-09-06 LAB — BASIC METABOLIC PANEL
BUN: 11 mg/dL (ref 6–23)
CO2: 25 mEq/L (ref 19–32)
Calcium: 9.2 mg/dL (ref 8.4–10.5)
Glucose, Bld: 284 mg/dL — ABNORMAL HIGH (ref 70–99)
Sodium: 137 mEq/L (ref 135–145)

## 2013-09-07 ENCOUNTER — Ambulatory Visit: Payer: Self-pay | Admitting: Emergency Medicine

## 2013-09-08 ENCOUNTER — Ambulatory Visit: Payer: Self-pay | Admitting: Obstetrics & Gynecology

## 2013-09-19 ENCOUNTER — Encounter: Payer: Self-pay | Admitting: Obstetrics & Gynecology

## 2013-09-27 ENCOUNTER — Ambulatory Visit (HOSPITAL_COMMUNITY): Payer: MEDICAID

## 2013-09-30 ENCOUNTER — Ambulatory Visit (HOSPITAL_COMMUNITY)
Admission: RE | Admit: 2013-09-30 | Discharge: 2013-09-30 | Disposition: A | Discharge status: Home / Self Care | Payer: Self-pay | Source: Ambulatory Visit | Attending: Internal Medicine | Admitting: Internal Medicine

## 2013-09-30 DIAGNOSIS — M5126 Other intervertebral disc displacement, lumbar region: Secondary | ICD-10-CM | POA: Insufficient documentation

## 2013-09-30 DIAGNOSIS — M79609 Pain in unspecified limb: Secondary | ICD-10-CM | POA: Insufficient documentation

## 2013-09-30 DIAGNOSIS — M549 Dorsalgia, unspecified: Secondary | ICD-10-CM

## 2013-09-30 DIAGNOSIS — M545 Low back pain, unspecified: Secondary | ICD-10-CM | POA: Insufficient documentation

## 2013-09-30 DIAGNOSIS — M5137 Other intervertebral disc degeneration, lumbosacral region: Secondary | ICD-10-CM | POA: Insufficient documentation

## 2013-09-30 DIAGNOSIS — M47817 Spondylosis without myelopathy or radiculopathy, lumbosacral region: Secondary | ICD-10-CM | POA: Insufficient documentation

## 2013-09-30 DIAGNOSIS — M51379 Other intervertebral disc degeneration, lumbosacral region without mention of lumbar back pain or lower extremity pain: Secondary | ICD-10-CM | POA: Insufficient documentation

## 2013-10-14 ENCOUNTER — Ambulatory Visit: Payer: Self-pay | Admitting: Internal Medicine

## 2013-10-16 IMAGING — CR DG CHEST 2V
2 series · 2 of 2 positions shown · non-contrast
Comparison: 08/08/2012 acute abdomen series

CLINICAL DATA: Cough.  Asthma.  Bronchitis.  Fever.  Chills.

CHEST - 2 VIEW

[w chest pa]
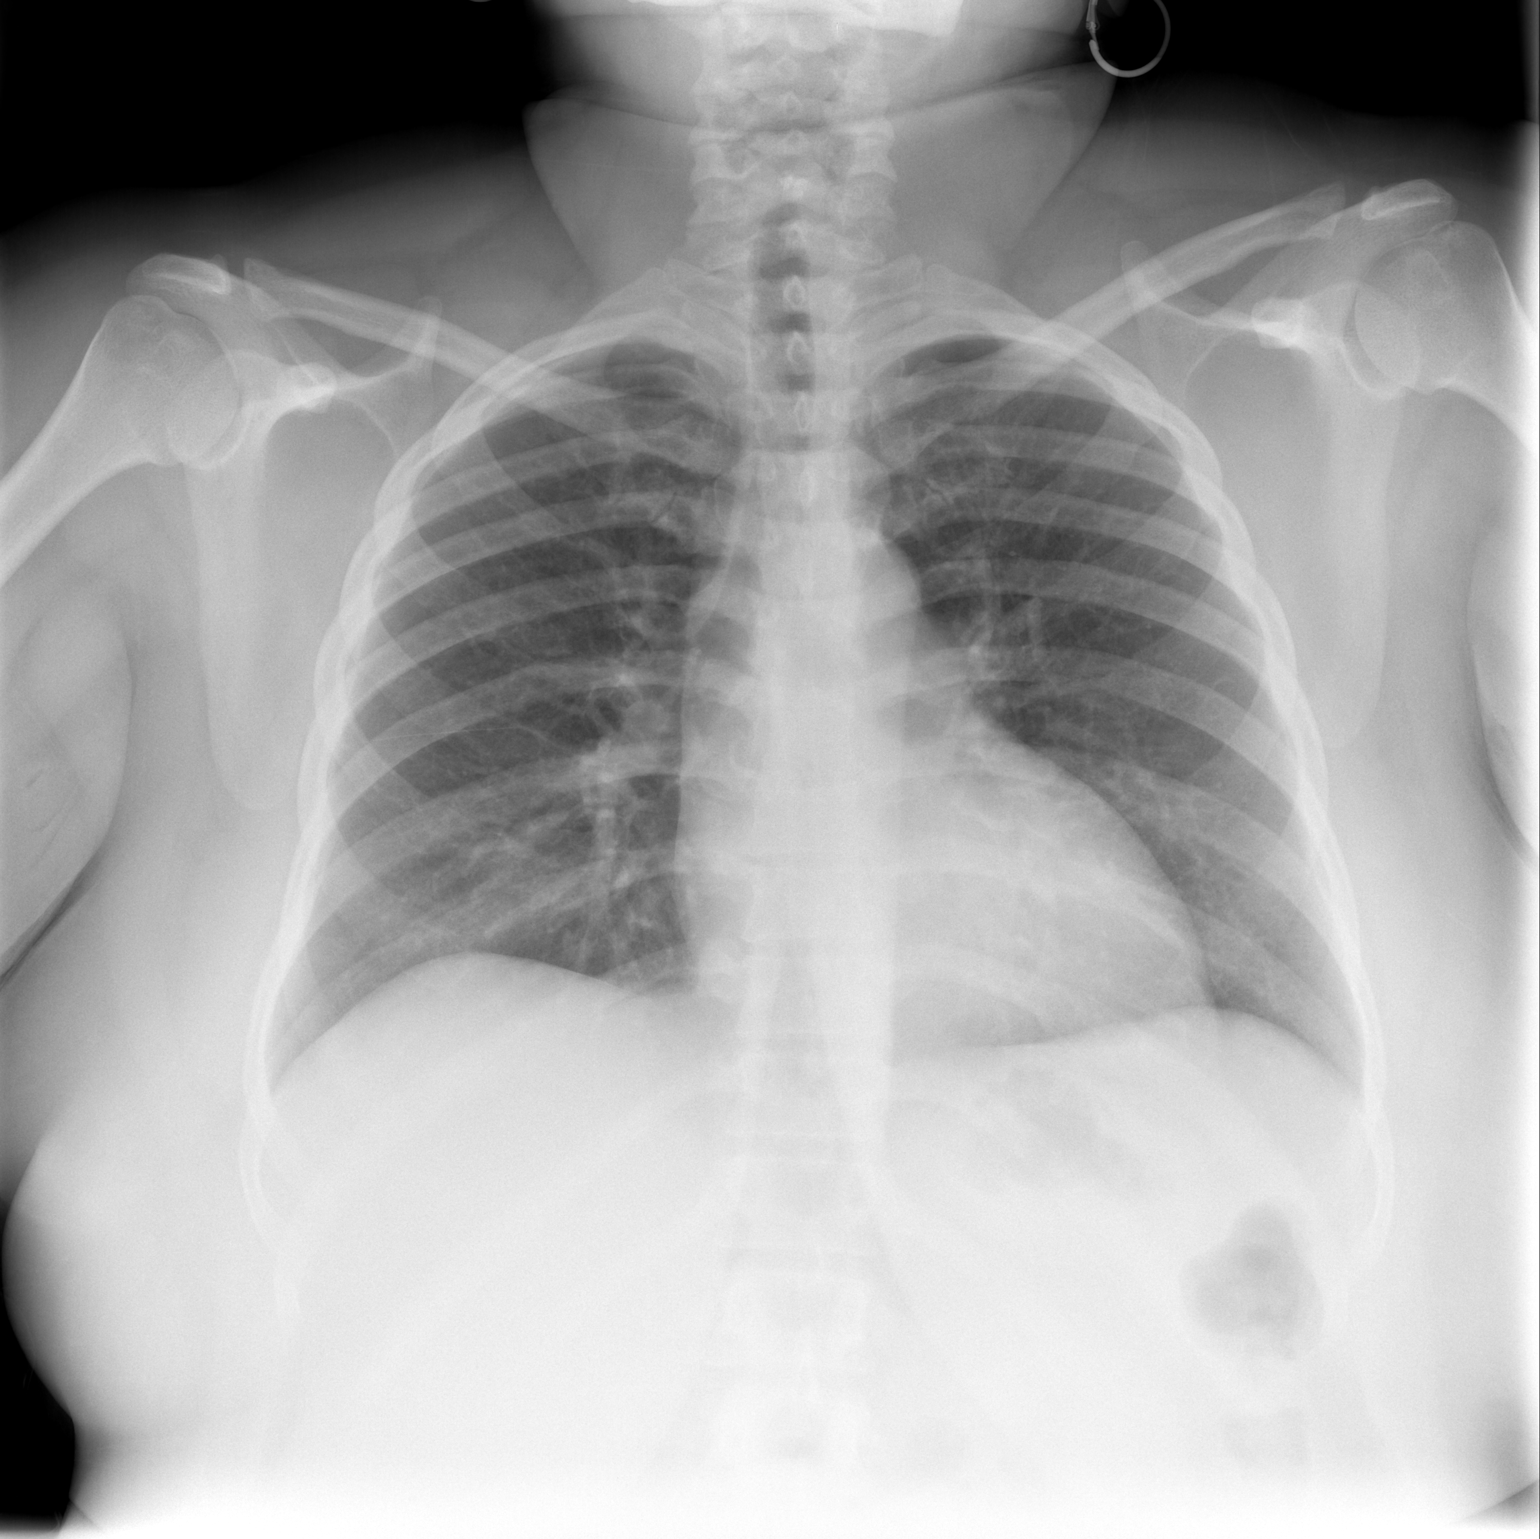

[w chest lat]
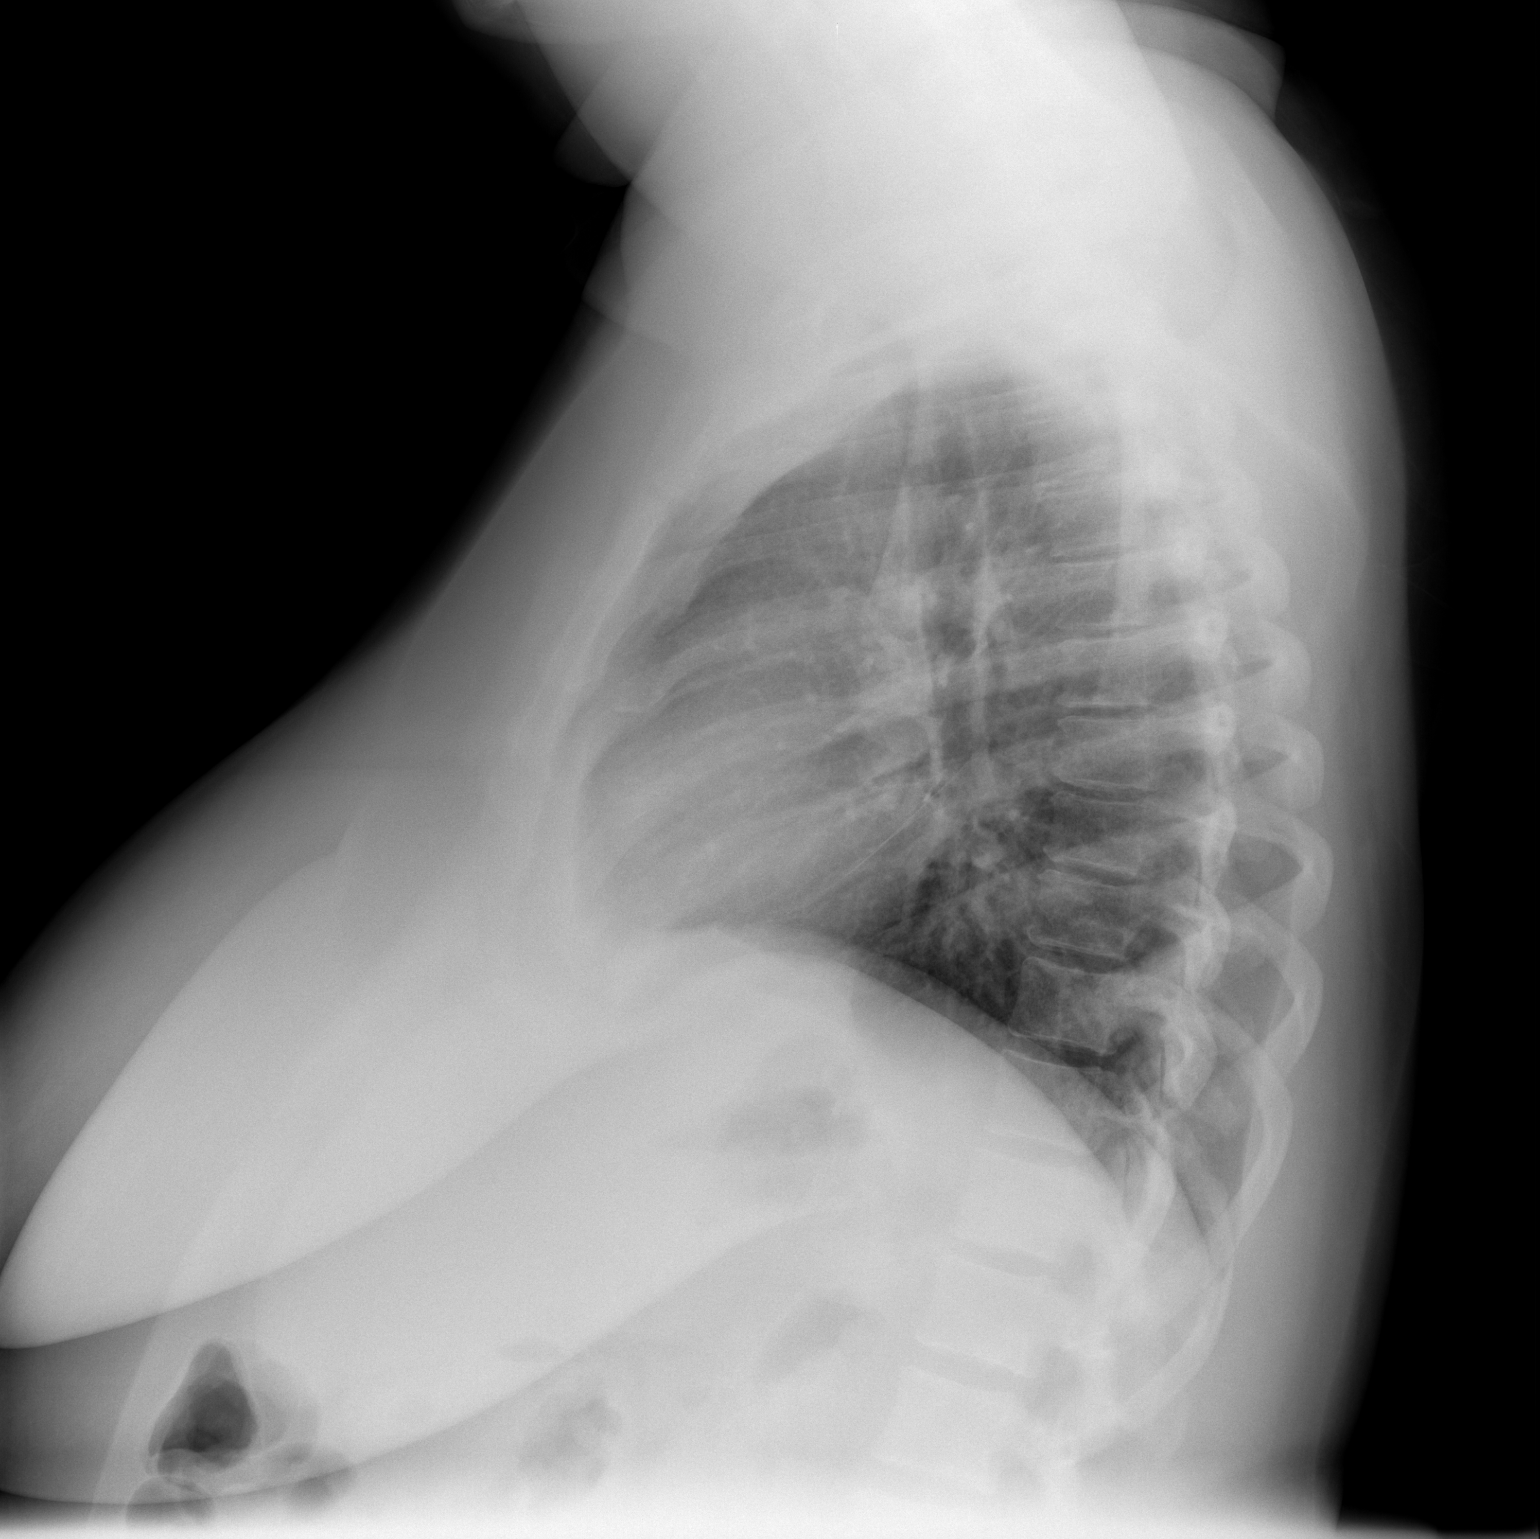

[2 of 2 positions shown; findings below may reference images not displayed]

FINDINGS: Midline trachea.  Normal heart size and mediastinal
contours.

No pleural effusion or pneumothorax.  Clear lungs.
IMPRESSION: No acute cardiopulmonary disease.

## 2013-10-20 ENCOUNTER — Telehealth: Payer: Self-pay | Admitting: Internal Medicine

## 2013-10-20 NOTE — Telephone Encounter (Signed)
Pt calling about results for MRI. Please f/u with pt.

## 2013-10-25 ENCOUNTER — Ambulatory Visit: Payer: Self-pay | Admitting: *Deleted

## 2013-11-10 ENCOUNTER — Ambulatory Visit: Payer: Self-pay | Attending: Internal Medicine | Admitting: Internal Medicine

## 2013-11-10 ENCOUNTER — Encounter: Payer: Self-pay | Admitting: Internal Medicine

## 2013-11-10 ENCOUNTER — Encounter (HOSPITAL_COMMUNITY): Payer: Self-pay | Admitting: Emergency Medicine

## 2013-11-10 ENCOUNTER — Emergency Department (HOSPITAL_COMMUNITY)
Admission: EM | Admit: 2013-11-10 | Discharge: 2013-11-10 | Discharge status: Against Medical Advice | Payer: Self-pay | Attending: Emergency Medicine | Admitting: Emergency Medicine

## 2013-11-10 ENCOUNTER — Emergency Department (HOSPITAL_COMMUNITY): Payer: Self-pay

## 2013-11-10 VITALS — BP 162/111 | HR 83 | Temp 98.6°F | Resp 16 | Ht 61.0 in | Wt 244.0 lb

## 2013-11-10 DIAGNOSIS — I10 Essential (primary) hypertension: Secondary | ICD-10-CM | POA: Insufficient documentation

## 2013-11-10 DIAGNOSIS — Z794 Long term (current) use of insulin: Secondary | ICD-10-CM | POA: Insufficient documentation

## 2013-11-10 DIAGNOSIS — I160 Hypertensive urgency: Secondary | ICD-10-CM

## 2013-11-10 DIAGNOSIS — E1310 Other specified diabetes mellitus with ketoacidosis without coma: Secondary | ICD-10-CM

## 2013-11-10 DIAGNOSIS — R0789 Other chest pain: Secondary | ICD-10-CM | POA: Insufficient documentation

## 2013-11-10 DIAGNOSIS — R0602 Shortness of breath: Secondary | ICD-10-CM | POA: Insufficient documentation

## 2013-11-10 DIAGNOSIS — R51 Headache: Secondary | ICD-10-CM | POA: Insufficient documentation

## 2013-11-10 DIAGNOSIS — E119 Type 2 diabetes mellitus without complications: Secondary | ICD-10-CM | POA: Insufficient documentation

## 2013-11-10 DIAGNOSIS — Z7982 Long term (current) use of aspirin: Secondary | ICD-10-CM | POA: Insufficient documentation

## 2013-11-10 DIAGNOSIS — R079 Chest pain, unspecified: Secondary | ICD-10-CM | POA: Insufficient documentation

## 2013-11-10 DIAGNOSIS — Z87891 Personal history of nicotine dependence: Secondary | ICD-10-CM | POA: Insufficient documentation

## 2013-11-10 DIAGNOSIS — M549 Dorsalgia, unspecified: Secondary | ICD-10-CM | POA: Insufficient documentation

## 2013-11-10 DIAGNOSIS — E1110 Type 2 diabetes mellitus with ketoacidosis without coma: Secondary | ICD-10-CM | POA: Insufficient documentation

## 2013-11-10 DIAGNOSIS — J45909 Unspecified asthma, uncomplicated: Secondary | ICD-10-CM | POA: Insufficient documentation

## 2013-11-10 DIAGNOSIS — IMO0002 Reserved for concepts with insufficient information to code with codable children: Secondary | ICD-10-CM | POA: Insufficient documentation

## 2013-11-10 DIAGNOSIS — Z79899 Other long term (current) drug therapy: Secondary | ICD-10-CM | POA: Insufficient documentation

## 2013-11-10 DIAGNOSIS — R1013 Epigastric pain: Secondary | ICD-10-CM | POA: Insufficient documentation

## 2013-11-10 LAB — CBC
HCT: 37.1 % (ref 36.0–46.0)
Hemoglobin: 12.7 g/dL (ref 12.0–15.0)
MCH: 29.5 pg (ref 26.0–34.0)
MCHC: 34.2 g/dL (ref 30.0–36.0)
MCV: 86.3 fL (ref 78.0–100.0)
PLATELETS: 299 10*3/uL (ref 150–400)
RBC: 4.3 MIL/uL (ref 3.87–5.11)
RDW: 13.7 % (ref 11.5–15.5)
WBC: 9.1 10*3/uL (ref 4.0–10.5)

## 2013-11-10 LAB — COMPREHENSIVE METABOLIC PANEL
ALK PHOS: 115 U/L (ref 39–117)
ALT: 24 U/L (ref 0–35)
AST: 16 U/L (ref 0–37)
Albumin: 3.5 g/dL (ref 3.5–5.2)
BILIRUBIN TOTAL: 0.2 mg/dL — AB (ref 0.3–1.2)
BUN: 11 mg/dL (ref 6–23)
CHLORIDE: 97 meq/L (ref 96–112)
CO2: 25 mEq/L (ref 19–32)
Calcium: 9.7 mg/dL (ref 8.4–10.5)
Creatinine, Ser: 0.82 mg/dL (ref 0.50–1.10)
GFR calc Af Amer: >90 mL/min (ref >90)
GFR calc non Af Amer: 86 mL/min — ABNORMAL LOW (ref >90)
GLUCOSE: 186 mg/dL — AB (ref 70–99)
POTASSIUM: 3.5 meq/L — AB (ref 3.7–5.3)
SODIUM: 137 meq/L (ref 137–147)
Total Protein: 7.6 g/dL (ref 6.0–8.3)

## 2013-11-10 LAB — I-STAT TROPONIN, ED: Troponin i, poc: 0.01 ng/mL (ref 0.00–0.08)

## 2013-11-10 LAB — GLUCOSE, POCT (MANUAL RESULT ENTRY): POC GLUCOSE: 243 mg/dL — AB (ref 70–99)

## 2013-11-10 LAB — D-DIMER, QUANTITATIVE (NOT AT ARMC): D DIMER QUANT: 0.76 ug{FEU}/mL — AB (ref 0.00–0.48)

## 2013-11-10 LAB — CBG MONITORING, ED: Glucose-Capillary: 153 mg/dL — ABNORMAL HIGH (ref 70–99)

## 2013-11-10 LAB — LIPASE, BLOOD: LIPASE: 76 U/L — AB (ref 11–59)

## 2013-11-10 MED ORDER — HYDROMORPHONE HCL PF 1 MG/ML IJ SOLN
1.0000 mg | Freq: Once | INTRAMUSCULAR | Status: AC
  Administered 2013-11-10: 1 mg via INTRAVENOUS
  Filled 2013-11-10: qty 1

## 2013-11-10 MED ORDER — ASPIRIN 325 MG PO TABS
325.0000 mg | ORAL_TABLET | Freq: Once | ORAL | Status: AC
  Administered 2013-11-10: 325 mg via ORAL

## 2013-11-10 MED ORDER — IOHEXOL 350 MG/ML SOLN
100.0000 mL | Freq: Once | INTRAVENOUS | Status: AC | PRN
  Administered 2013-11-10: 100 mL via INTRAVENOUS

## 2013-11-10 MED ORDER — NITROGLYCERIN 0.4 MG SL SUBL
0.4000 mg | SUBLINGUAL_TABLET | SUBLINGUAL | Status: AC | PRN
  Administered 2013-11-10 (×3): 0.4 mg via SUBLINGUAL

## 2013-11-10 MED ORDER — IOHEXOL 300 MG/ML  SOLN
25.0000 mL | INTRAMUSCULAR | Status: AC
  Administered 2013-11-10: 25 mL via ORAL

## 2013-11-10 MED ORDER — IOHEXOL 300 MG/ML  SOLN
60.0000 mL | Freq: Once | INTRAMUSCULAR | Status: AC | PRN
  Administered 2013-11-10: 60 mL via INTRAVENOUS

## 2013-11-10 MED ORDER — ACETAMINOPHEN 500 MG PO TABS
1000.0000 mg | ORAL_TABLET | Freq: Once | ORAL | Status: AC
  Administered 2013-11-10: 1000 mg via ORAL
  Filled 2013-11-10: qty 2

## 2013-11-10 NOTE — ED Provider Notes (Signed)
[name] S 8:00PM patient discussed in sign out. Patient with significant risk factors for ACS presenting with intermittent chest pains for 2 days that is worsening. Initial workup showed slightly elevated d-dimer and lipase. His CT scans to rule out PE and evaluate pancreas pending. Given patient's risks of may plan for observational admission for continued cardiac rule out.  9:00PM CT scans unremarkable. No signs of PE. No inflammation around the pancreas. No other concerning findings to explain patient's chest pain symptoms. Patient currently chest pain-free. Does complain of some headache. Discussed with patient recommendations for admission for continued chest pain rule out. The patient does not wish to stay any longer in the hospital. She prefers to followup palpation. Patient was discussed with tried hospitals, Dr. Alcario Drought who was agreeable to observational admission. Patient continued to not wish to stay. She was advised of the risks. She expressed understanding. She was given cardiology referral.   Martie Lee, PA-C 11/10/13 2130

## 2013-11-10 NOTE — ED Provider Notes (Signed)
CSN: [id number]     Arrival date & time 11/10/13  1644 History   First MD Initiated Contact with Patient 11/10/13 1646     Chief Complaint  Patient presents with  . Chest Pain  . Headache  . Shortness of Breath    "hurts when i take a deep breath"     (Consider location/radiation/quality/duration/timing/severity/associated sxs/prior Treatment) HPI Comments: 43 year old female presents with chest pain for last 2 days. States been intermittent but worsening over these 2 days. She describes as a heavy/pressure like pain that apparently become sharp. The pain starts in her sternum and the right around her chest to her back on the right side. There is no other radiation of the pain. She does feel short of breath with this. No nausea or vomiting. Has never had pain like this before. The pain is worse with inspiration. Pain does radiate straight through from her chest to back. No leg swelling, leg pain or history of DVT. Has history of HTN, diabetes, former smoker and family history of early MI. Pain does seem to improve with leaning forward. Has not been ill recently.    Past Medical History  Diagnosis Date  . DM (diabetes mellitus)   . Hypertension   . Hypokalemia   . Asthma    Past Surgical History  Procedure Laterality Date  . Cesarean section      x 3  . Appendectomy    . Vesicovaginal fistula closure w/ tah  2009  . Hernia repair     Family History  Problem Relation Age of Onset  . Heart attack Mother   . Stroke Mother   . Diabetes Mother   . Hypertension Mother   . Arthritis Mother   . Stroke Father   . Hypertension Sister   . Diabetes Sister   . Seizures Brother   . Diabetes Brother    History  Substance Use Topics  . Smoking status: Former Smoker -- 0.30 packs/day for 22 years    Types: Cigarettes    Quit date: 05/01/2011  . Smokeless tobacco: Never Used  . Alcohol Use: Yes     Comment: rare   OB History   Grav Para Term Preterm Abortions TAB SAB Ect Mult  Living                 Review of Systems  Constitutional: Negative for fever.  Respiratory: Positive for shortness of breath.   Cardiovascular: Positive for chest pain.  Gastrointestinal: Negative for nausea, vomiting and abdominal pain.  Musculoskeletal: Positive for back pain.  Neurological: Positive for headaches. Negative for weakness.  All other systems reviewed and are negative.      Allergies  Review of patient's allergies indicates no known allergies.  Home Medications   Current Outpatient Rx  Name  Route  Sig  Dispense  Refill  . albuterol (PROVENTIL HFA;VENTOLIN HFA) 108 (90 BASE) MCG/ACT inhaler   Inhalation   Inhale 2 puffs into the lungs every 6 (six) hours as needed for wheezing.   1 Inhaler   11   . albuterol (PROVENTIL) (2.5 MG/3ML) 0.083% nebulizer solution   Nebulization   Take 3 mLs (2.5 mg total) by nebulization every 6 (six) hours as needed for wheezing.   75 mL   11   . amLODipine (NORVASC) 10 MG tablet   Oral   Take 1 tablet (10 mg total) by mouth daily.   90 tablet   3   . aspirin EC 325 MG tablet  Oral   Take 1 tablet (325 mg total) by mouth daily.   30 tablet   5   . benazepril-hydrochlorthiazide (LOTENSIN HCT) 20-25 MG per tablet   Oral   Take 1 tablet by mouth daily.   30 tablet   11   . budesonide-formoterol (SYMBICORT) 160-4.5 MCG/ACT inhaler   Inhalation   Inhale 2 puffs into the lungs every other day.   1 Inhaler   11   . carvedilol (COREG) 3.125 MG tablet   Oral   Take 1 tablet (3.125 mg total) by mouth 2 (two) times daily with a meal.   60 tablet   5   . cyclobenzaprine (FLEXERIL) 5 MG tablet   Oral   Take 1 tablet (5 mg total) by mouth 3 (three) times daily as needed for muscle spasms.   90 tablet   2   . Dapagliflozin Propanediol 5 MG TABS   Oral   Take 5 mg by mouth daily.   30 tablet   4   . gabapentin (NEURONTIN) 400 MG capsule   Oral   Take 1 capsule (400 mg total) by mouth 3 (three) times  daily.   90 capsule   5   . glucose monitoring kit (FREESTYLE) monitoring kit   Does not apply   1 each by Does not apply route 4 (four) times daily - after meals and at bedtime. 1 month Diabetic Testing Supplies for QAC-QHS accuchecks.   1 each   1   . insulin NPH-regular (RELION 70/30) (70-30) 100 UNIT/ML injection   Subcutaneous   Inject 50 Units into the skin 2 (two) times daily with a meal.   10 mL   11   . meloxicam (MOBIC) 7.5 MG tablet   Oral   Take 1 tablet (7.5 mg total) by mouth daily.   30 tablet   4   . metFORMIN (GLUCOPHAGE) 1000 MG tablet   Oral   Take 1 tablet (1,000 mg total) by mouth 2 (two) times daily with a meal.   60 tablet   11   . traMADol (ULTRAM) 50 MG tablet   Oral   Take 1-2 tablets (50-100 mg total) by mouth every 6 (six) hours as needed for moderate pain.   60 tablet   1    BP 117/82  Pulse 84  Temp(Src) 98.7 F (37.1 C) (Oral)  Resp 19  Ht 5' 1"  (1.549 m)  Wt 242 lb (109.77 kg)  BMI 45.75 kg/m2  SpO2 100% Physical Exam  Nursing note and vitals reviewed. Constitutional: She is oriented to person, place, and time. She appears well-developed and well-nourished.  HENT:  Head: Normocephalic and atraumatic.  Right Ear: External ear normal.  Left Ear: External ear normal.  Nose: Nose normal.  Eyes: Right eye exhibits no discharge. Left eye exhibits no discharge.  Cardiovascular: Normal rate, regular rhythm and normal heart sounds.   Pulmonary/Chest: Effort normal and breath sounds normal. She exhibits no tenderness.  Abdominal: Soft. She exhibits no distension. There is no tenderness.  Neurological: She is alert and oriented to person, place, and time.  Skin: Skin is warm and dry.    ED Course  Procedures (including critical care time) Labs Review Labs Reviewed  COMPREHENSIVE METABOLIC PANEL - Abnormal; Notable for the following:    Potassium 3.5 (*)    Glucose, Bld 186 (*)    Total Bilirubin 0.2 (*)    GFR calc non Af Amer  86 (*)    All other  components within normal limits  LIPASE, BLOOD - Abnormal; Notable for the following:    Lipase 76 (*)    All other components within normal limits  D-DIMER, QUANTITATIVE - Abnormal; Notable for the following:    D-Dimer, Quant 0.76 (*)    All other components within normal limits  CBG MONITORING, ED - Abnormal; Notable for the following:    Glucose-Capillary 153 (*)    All other components within normal limits  CBC  I-STAT TROPOININ, ED   Imaging Review Dg Chest 2 View  11/10/2013   CLINICAL DATA:  Asthma.  Chest pain.  Shortness of breath.  EXAM: CHEST  2 VIEW  COMPARISON:  Chest x-ray 04/27/2013.  FINDINGS: Lung volumes are normal. No consolidative airspace disease. No pleural effusions. No pneumothorax. No pulmonary nodule or mass noted. Pulmonary vasculature and the cardiomediastinal silhouette are within normal limits.  IMPRESSION: 1.  No radiographic evidence of acute cardiopulmonary disease.   Electronically Signed   By: Vinnie Langton M.D.   On: 11/10/2013 18:05    EKG Interpretation    Date/Time:  Thursday November 10 2013 16:45:45 EST Ventricular Rate:  71 PR Interval:  174 QRS Duration: 95 QT Interval:  385 QTC Calculation: 418 R Axis:   21 Text Interpretation:  Sinus rhythm Low voltage, precordial leads No significant change since last tracing Confirmed by [place] ([id number]) on 11/10/2013 4:48:40 PM            MDM   Final diagnoses:  None    Patient's pain improved some with nitro, given ASA by EMS. EKG non-ischemic. MIld epigastric tenderness, will get CT. Low riks for PE, will get CTA to r/o PE given elevated ddimer. While her troponin is negative, she is of moderate ACS risk, will need r/o in hospital.    Ephraim Hamburger, MD 11/10/13 2055

## 2013-11-10 NOTE — ED Notes (Signed)
Patient transported to x-ray. ?

## 2013-11-10 NOTE — Progress Notes (Signed)
Pt is here today following up on her HTN and diabetes. Pt reports having chest pain, nausea, SOB and prolonged headaches.

## 2013-11-10 NOTE — Progress Notes (Signed)
Patient ID: Enisa Runyan, female   DOB: May 17, 1971, 43 y.o.   MRN: [id number]   Deziree Mokry, is a 43 y.o. female  XMI:[id number]  MGN:[id number]  DOB - 1971/04/15  No chief complaint on file.       Subjective:   Navina Wohlers is a 43 y.o. female here today for a follow up visit. Patient has history of hypertension uncontrolled, all controlled diabetes, treated to the clinic today with a complaint of shortness of breath, chest pain, and severe headache which started about 2-3 days ago. Chest pain is crushing, rated 10 out of 10 in intensity, mostly on the left side radiating to the left hand. She claims to be compliant with her medications but her blood pressure remains uncontrolled. Her headache is mostly frontal, also rated 10 out of 10 in intensity, no associated blurry vision, no double vision. Patient claims she feels nauseous but no vomiting.  Patient has No headache, No chest pain, No abdominal pain - No Nausea, No new weakness tingling or numbness, No Cough - SOB.  Problem  DM (Diabetes Mellitus) Type 2, Uncontrolled, With Ketoacidosis  Chest Pain  Hypertensive Urgency    ALLERGIES: No Known Allergies  PAST MEDICAL HISTORY: Past Medical History  Diagnosis Date  . DM (diabetes mellitus)   . Hypertension   . Hypokalemia   . Asthma     MEDICATIONS AT HOME: Prior to Admission medications   Medication Sig Start Date End Date Taking? Authorizing Provider  albuterol (PROVENTIL HFA;VENTOLIN HFA) 108 (90 BASE) MCG/ACT inhaler Inhale 2 puffs into the lungs every 6 (six) hours as needed for wheezing. 05/19/13  Yes Theodis Blaze, MD  albuterol (PROVENTIL) (2.5 MG/3ML) 0.083% nebulizer solution Take 3 mLs (2.5 mg total) by nebulization every 6 (six) hours as needed for wheezing. 09/05/13  Yes Ripudeep Krystal Eaton, MD  amLODipine (NORVASC) 10 MG tablet Take 1 tablet (10 mg total) by mouth daily. 09/05/13  Yes Ripudeep Krystal Eaton, MD  aspirin EC 325 MG  tablet Take 1 tablet (325 mg total) by mouth daily. 09/05/13  Yes Ripudeep Krystal Eaton, MD  benazepril-hydrochlorthiazide (LOTENSIN HCT) 20-25 MG per tablet Take 1 tablet by mouth daily. 09/05/13  Yes Ripudeep Krystal Eaton, MD  budesonide-formoterol (SYMBICORT) 160-4.5 MCG/ACT inhaler Inhale 2 puffs into the lungs every other day. 05/19/13  Yes Theodis Blaze, MD  carvedilol (COREG) 3.125 MG tablet Take 1 tablet (3.125 mg total) by mouth 2 (two) times daily with a meal. 09/05/13  Yes Ripudeep K Rai, MD  cyclobenzaprine (FLEXERIL) 5 MG tablet Take 1 tablet (5 mg total) by mouth 3 (three) times daily as needed for muscle spasms. 09/05/13  Yes Ripudeep Krystal Eaton, MD  Dapagliflozin Propanediol 5 MG TABS Take 5 mg by mouth daily. 09/05/13  Yes Ripudeep Krystal Eaton, MD  gabapentin (NEURONTIN) 400 MG capsule Take 1 capsule (400 mg total) by mouth 3 (three) times daily. 09/05/13  Yes Ripudeep Krystal Eaton, MD  glucose monitoring kit (FREESTYLE) monitoring kit 1 each by Does not apply route 4 (four) times daily - after meals and at bedtime. 1 month Diabetic Testing Supplies for QAC-QHS accuchecks. 09/05/13  Yes Ripudeep Krystal Eaton, MD  insulin NPH-regular (RELION 70/30) (70-30) 100 UNIT/ML injection Inject 50 Units into the skin 2 (two) times daily with a meal. 09/05/13  Yes Ripudeep K Rai, MD  meloxicam (MOBIC) 7.5 MG tablet Take 1 tablet (7.5 mg total) by mouth daily. 09/05/13  Yes Ripudeep Krystal Eaton, MD  metFORMIN (GLUCOPHAGE) 1000 MG tablet Take 1 tablet (1,000 mg total) by mouth 2 (two) times daily with a meal. 08/03/13  Yes Thurnell Lose, MD  traMADol (ULTRAM) 50 MG tablet Take 1-2 tablets (50-100 mg total) by mouth every 6 (six) hours as needed for moderate pain. 09/05/13  Yes Ripudeep Krystal Eaton, MD     Objective:   Filed Vitals:   11/10/13 1500  BP: 162/111  Pulse: 83  Temp: 98.6 F (37 C)  TempSrc: Oral  Resp: 16  Height: _0  (1.549 m)  Weight: 244 lb (110.678 kg)  SpO2: 99%    Exam General appearance : Awake, alert, mild  painful distress. Patient looks acutely ill, morbidly obese HEENT: Atraumatic and Normocephalic, pupils equally reactive to light and accomodation Neck: supple, no JVD. No cervical lymphadenopathy.  Chest:Good air entry bilaterally, no added sounds  CVS: S1 S2 regular, no murmurs.  Abdomen: Bowel sounds present, Non tender and not distended with no gaurding, rigidity or rebound. Extremities: B/L Lower Ext shows no edema, both legs are warm to touch Neurology: Awake alert, and oriented X 3, CN II-XII intact, Non focal Skin:No Rash Wounds:N/A  Data Review  Assessment & Plan   1. Chest pain Transfer patient to the emergency department to rule out acute coronary syndrome on the background of hypertensive urgency, hyperglycemia, morbid obesity and complaining of chest pain , headache and shortness of breath  - EKG 12-Lead showed left axis deviation and left ventricular hypertrophy  - aspirin tablet 325 mg; Take 1 tablet (325 mg total) by mouth once.  2. DM (diabetes mellitus) type 2, uncontrolled, with ketoacidosis  - Glucose (CBG) - 240  3. Essential hypertension, benign   4. Hypertensive urgency Transfer patient to the emergency department   Return if symptoms worsen or fail to improve, for Routine Follow Up.  The patient was given clear instructions to go to ER or return to medical center if symptoms don't improve, worsen or new problems develop. The patient verbalized understanding. The patient was told to call to get lab results if they haven't heard anything in the next week.    Angelica Chessman, MD, [place], [place], [place] and [place], [place]   11/10/2013, 4:46 PM

## 2013-11-10 NOTE — Discharge Instructions (Signed)
You were seen and evaluated for your chest pain symptoms. Your testing today has not shown any cause of your symptoms at this time. Your providers recommended staying in the hospital for further testing of your heart to be sure this was not a cause of your pain. You did not wish to stay in the hospital. Please followup with a primary care provider and a cardiology specialist for continued evaluation and treatment. Return if you have any change in symptoms or if you change your mind and wish to have more testing.    Chest Pain (Nonspecific) It is often hard to give a specific diagnosis for the cause of chest pain. There is always a chance that your pain could be related to something serious, such as a heart attack or a blood clot in the lungs. You need to follow up with your caregiver for further evaluation. CAUSES   Heartburn.  Pneumonia or bronchitis.  Anxiety or stress.  Inflammation around your heart (pericarditis) or lung (pleuritis or pleurisy).  A blood clot in the lung.  A collapsed lung (pneumothorax). It can develop suddenly on its own (spontaneous pneumothorax) or from injury (trauma) to the chest.  Shingles infection (herpes zoster virus). The chest wall is composed of bones, muscles, and cartilage. Any of these can be the source of the pain.  The bones can be bruised by injury.  The muscles or cartilage can be strained by coughing or overwork.  The cartilage can be affected by inflammation and become sore (costochondritis). DIAGNOSIS  Lab tests or other studies, such as X-rays, electrocardiography, stress testing, or cardiac imaging, may be needed to find the cause of your pain.  TREATMENT   Treatment depends on what may be causing your chest pain. Treatment may include:  Acid blockers for heartburn.  Anti-inflammatory medicine.  Pain medicine for inflammatory conditions.  Antibiotics if an infection is present.  You may be advised to change lifestyle habits.  This includes stopping smoking and avoiding alcohol, caffeine, and chocolate.  You may be advised to keep your head raised (elevated) when sleeping. This reduces the chance of acid going backward from your stomach into your esophagus.  Most of the time, nonspecific chest pain will improve within 2 to 3 days with rest and mild pain medicine. HOME CARE INSTRUCTIONS   If antibiotics were prescribed, take your antibiotics as directed. Finish them even if you start to feel better.  For the next few days, avoid physical activities that bring on chest pain. Continue physical activities as directed.  Do not smoke.  Avoid drinking alcohol.  Only take over-the-counter or prescription medicine for pain, discomfort, or fever as directed by your caregiver.  Follow your caregiver's suggestions for further testing if your chest pain does not go away.  Keep any follow-up appointments you made. If you do not go to an appointment, you could develop lasting (chronic) problems with pain. If there is any problem keeping an appointment, you must call to reschedule. SEEK MEDICAL CARE IF:   You think you are having problems from the medicine you are taking. Read your medicine instructions carefully.  Your chest pain does not go away, even after treatment.  You develop a rash with blisters on your chest. SEEK IMMEDIATE MEDICAL CARE IF:   You have increased chest pain or pain that spreads to your arm, neck, jaw, back, or abdomen.  You develop shortness of breath, an increasing cough, or you are coughing up blood.  You have severe back or  abdominal pain, feel nauseous, or vomit.  You develop severe weakness, fainting, or chills.  You have a fever. THIS IS AN EMERGENCY. Do not wait to see if the pain will go away. Get medical help at once. Call your local emergency services (911 in [place] Do not drive yourself to the hospital. MAKE SURE YOU:   Understand these instructions.  Will watch your  condition.  Will get help right away if you are not doing well or get worse. Document Released: 06/18/2005 Document Revised: 12/01/2011 Document Reviewed: 04/13/2008 [facility] Patient Information 2014 [place].

## 2013-11-10 NOTE — ED Notes (Signed)
Pt c/o chest pressure x 2 days that radiates to back, feels like pressure, also has migraine and sob, numbness in fingers.  Was at Dr. Gabriel Carina for checkup, sent here for evaluation, hyperglycemic, hypertensive, CBG: 248, BP: 160/110, HR 86.  Given 324 ASA PTA as well as 1 NTG that she states helped her chest pain a little bit.  No hx of cardiac problems according to EMS but patient's mother died of an early MI at age 45.

## 2013-11-11 ENCOUNTER — Ambulatory Visit: Payer: Self-pay | Admitting: Internal Medicine

## 2013-11-11 NOTE — ED Provider Notes (Signed)
Medical screening examination/treatment/procedure(s) were performed by non-physician practitioner and as supervising physician I was immediately available for consultation/collaboration.  EKG Interpretation    Date/Time:  Thursday November 10 2013 16:45:45 EST Ventricular Rate:  71 PR Interval:  174 QRS Duration: 95 QT Interval:  385 QTC Calculation: 418 R Axis:   21 Text Interpretation:  Sinus rhythm Low voltage, precordial leads No significant change since last tracing Confirmed by [place]  MD, SCOTT ([id number]) on 11/10/2013 4:48:40 PM              Sharyon Cable, MD 11/11/13 [phone]

## 2013-11-22 NOTE — Telephone Encounter (Signed)
Has already been informed

## 2013-11-25 IMAGING — CR DG CHEST 1V PORT
1 series · 1 of 1 positions shown · non-contrast
Comparison: Chest x-ray of 03/18/2013

CLINICAL DATA: Shortness of breath, cough, follow-up

PORTABLE CHEST - 1 VIEW

[AP]
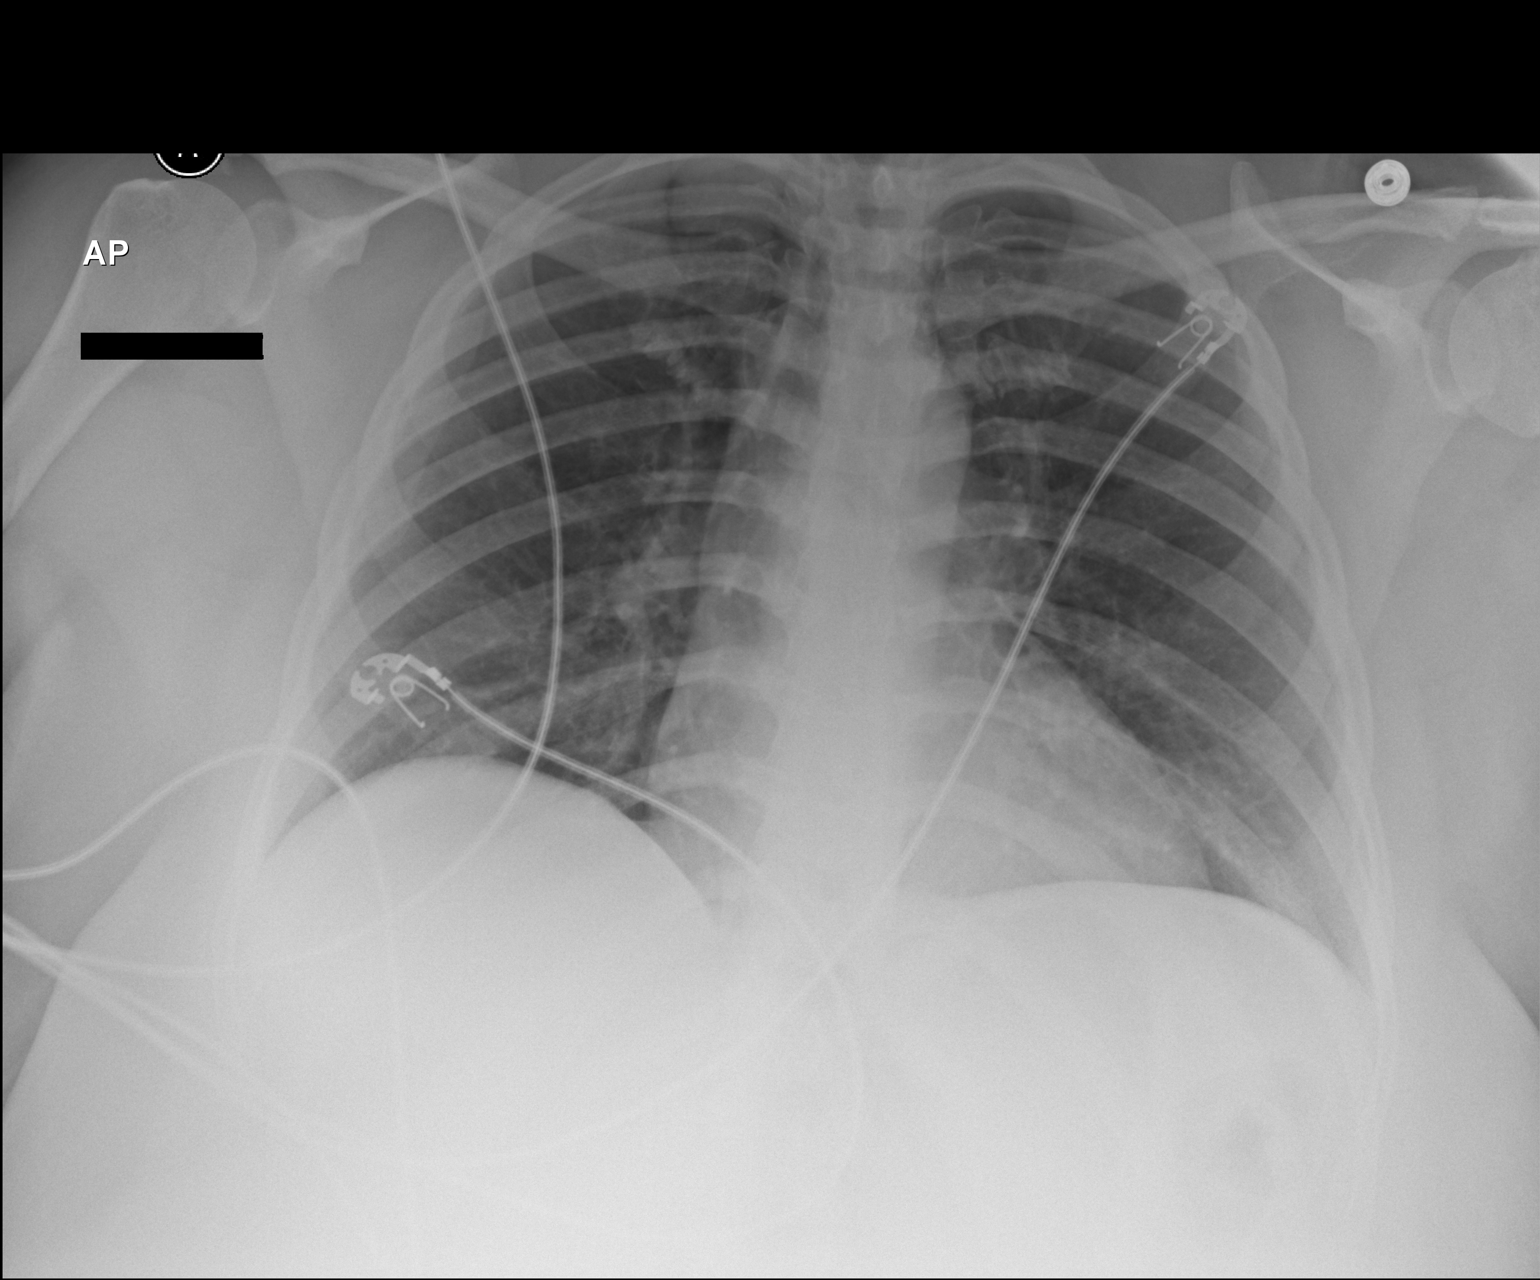

[1 of 1 positions shown; findings below may reference images not displayed]

FINDINGS: The lungs are clear.  No infiltrate or effusion is seen.
Mediastinal contours are stable.  The heart is within normal limits
in size.  No bony abnormality is seen.
IMPRESSION: No active lung disease.  Stable chest x-ray.

## 2013-12-01 ENCOUNTER — Ambulatory Visit: Payer: Self-pay | Attending: Internal Medicine | Admitting: Family Medicine

## 2013-12-01 VITALS — BP 151/119 | HR 91 | Temp 98.7°F | Resp 16 | Ht 61.0 in | Wt 242.0 lb

## 2013-12-01 DIAGNOSIS — Z09 Encounter for follow-up examination after completed treatment for conditions other than malignant neoplasm: Secondary | ICD-10-CM | POA: Insufficient documentation

## 2013-12-01 DIAGNOSIS — E119 Type 2 diabetes mellitus without complications: Secondary | ICD-10-CM | POA: Insufficient documentation

## 2013-12-01 DIAGNOSIS — M545 Low back pain, unspecified: Secondary | ICD-10-CM | POA: Insufficient documentation

## 2013-12-01 DIAGNOSIS — J45901 Unspecified asthma with (acute) exacerbation: Secondary | ICD-10-CM | POA: Insufficient documentation

## 2013-12-01 MED ORDER — ALBUTEROL SULFATE HFA 108 (90 BASE) MCG/ACT IN AERS
2.0000 | INHALATION_SPRAY | Freq: Four times a day (QID) | RESPIRATORY_TRACT | Status: DC | PRN
Start: 2013-12-01 — End: 2013-12-14

## 2013-12-01 MED ORDER — PREDNISONE 10 MG PO TABS: ORAL_TABLET | ORAL | Status: DC

## 2013-12-01 MED ORDER — ALBUTEROL SULFATE (2.5 MG/3ML) 0.083% IN NEBU: 2.5000 mg | INHALATION_SOLUTION | Freq: Four times a day (QID) | RESPIRATORY_TRACT | Status: DC | PRN

## 2013-12-01 MED ORDER — FLUTICASONE-SALMETEROL 250-50 MCG/DOSE IN AEPB: 1.0000 | INHALATION_SPRAY | Freq: Two times a day (BID) | RESPIRATORY_TRACT | Status: DC

## 2013-12-01 MED ORDER — ALBUTEROL SULFATE (2.5 MG/3ML) 0.083% IN NEBU
2.5000 mg | INHALATION_SOLUTION | Freq: Once | RESPIRATORY_TRACT | Status: AC
  Administered 2013-12-01: 2.5 mg via RESPIRATORY_TRACT

## 2013-12-01 MED ORDER — MELOXICAM 7.5 MG PO TABS: 7.5000 mg | ORAL_TABLET | Freq: Every day | ORAL | Status: DC

## 2013-12-01 MED ORDER — GLUCOSE BLOOD VI STRP: ORAL_STRIP | Status: DC

## 2013-12-01 MED ORDER — BUDESONIDE-FORMOTEROL FUMARATE 160-4.5 MCG/ACT IN AERO: 2.0000 | INHALATION_SPRAY | Freq: Two times a day (BID) | RESPIRATORY_TRACT | Status: DC

## 2013-12-01 NOTE — Progress Notes (Signed)
Pt is here following up on her diabetes and chest pain. Last night pt started having a persistent cough. Today she is still coughing and wheezing.

## 2013-12-01 NOTE — Progress Notes (Signed)
   Subjective:    Patient ID: [name], female    DOB: Dec 23, 1970, 43 y.o.   MRN: [id number]  HPI  Patient is here for followup on MRI results due to chronic lumbar back pain the  Patient is also having an acute exacerbation of her asthma. It started last week with increasing with coughing in his progress to multiple coughing episodes at night and during the day, decreased exercise tolerance, increased wheezing and shortness of breath. She has been using her albuterol inhaler without relief multiple times a day. She has not been taking her Symbicort. This is because she didn't refill it because she did not think she needed it. She believes this exacerbation is brought on by the weather changing. She denies any chest pain, fevers or other systemic symptoms.  Review of Systems A 12 point review of systems is negative except as per hpi.       Objective:   Physical Exam  Nursing note and vitals reviewed. Constitutional: She is oriented to person, place, and time. She appears well-developed and well-nourished.  HENT:  Mouth/Throat: Oropharynx is clear and moist. No oropharyngeal exudate.  Neck: Normal range of motion. Neck supple. No thyromegaly present.  Cardiovascular: Normal rate, regular rhythm and normal heart sounds.   Pulmonary/Chest: slightly increased WOB pre-neb. Also wheezes insp and exp pretx. Post tx, scattered exp wheezes only, good air moevment, and no increased wob.  Abdominal: Soft. Bowel sounds are normal. She exhibits no distension. There is no tenderness. There is no rebound.  Lymphadenopathy:    She has no cervical adenopathy.  Neurological: She is alert and oriented to person, place, and time. She has normal reflexes.  Skin: Skin is warm and dry.  Psychiatric: She has a normal mood and affect. Her behavior is normal.        Assessment & Plan:   [name] was seen today for follow-up.  Diagnoses and associated orders for this visit:  Asthma with  acute exacerbation - albuterol (PROVENTIL) (2.5 MG/3ML) 0.083% nebulizer solution 2.5 mg; Take 3 mLs (2.5 mg total) by nebulization once. - albuterol (PROVENTIL HFA;VENTOLIN HFA) 108 (90 BASE) MCG/ACT inhaler; Inhale 2 puffs into the lungs every 6 (six) hours as needed for wheezing. - albuterol (PROVENTIL) (2.5 MG/3ML) 0.083% nebulizer solution; Take 3 mLs (2.5 mg total) by nebulization every 6 (six) hours as needed for wheezing. - predniSONE (DELTASONE) 10 MG tablet; 6 tabs for 1 day, 5 tabs for 1 day, 4 tabs for 1 day, 3 tabs for 1 day, 2 tabs for 1 day, 1 tab for 1 day. Each day take all pills at the same time with food. - budesonide-formoterol (SYMBICORT) 160-4.5 MCG/ACT inhaler; Inhale 2 puffs into the lungs 2 (two) times daily.  Lumbar back pain - Ambulatory referral to Neurosurgery - meloxicam (MOBIC) 7.5 MG tablet; Take 1 tablet (7.5 mg total) by mouth daily.  Diabetes - Ambulatory referral to diabetic education - glucose blood test strip; Use as instructed    F/u 2 mos, earlier if needed. counselled on controlling sugars while on prednisone - if unable to do so, she should let us know or go to the ED. ED if resp sx worsen acutely.

## 2013-12-01 NOTE — Patient Instructions (Signed)
Asthma, Adult Asthma is a recurring condition in which the airways tighten and narrow. Asthma can make it difficult to breathe. It can cause coughing, wheezing, and shortness of breath. Asthma episodes (also called asthma attacks) range from minor to life-threatening. Asthma cannot be cured, but medicines and lifestyle changes can help control it. CAUSES Asthma is believed to be caused by inherited (genetic) and environmental factors, but its exact cause is unknown. Asthma may be triggered by allergens, lung infections, or irritants in the air. Asthma triggers are different for each person. Common triggers include:   Animal dander.  Dust mites.  Cockroaches.  Pollen from trees or grass.  Mold.  Smoke.  Air pollutants such as dust, household cleaners, hair sprays, aerosol sprays, paint fumes, strong chemicals, or strong odors.  Cold air, weather changes, and winds (which increase molds and pollens in the air).  Strong emotional expressions such as crying or laughing hard.  Stress.  Certain medicines (such as aspirin) or types of drugs (such as beta-blockers).  Sulfites in foods and drinks. Foods and drinks that may contain sulfites include dried fruit, potato chips, and sparkling grape juice.  Infections or inflammatory conditions such as the flu, a cold, or an inflammation of the nasal membranes (rhinitis).  Gastroesophageal reflux disease (GERD).  Exercise or strenuous activity. SYMPTOMS Symptoms may occur immediately after asthma is triggered or many hours later. Symptoms include:  Wheezing.  Excessive nighttime or early morning coughing.  Frequent or severe coughing with a common cold.  Chest tightness.  Shortness of breath. DIAGNOSIS  The diagnosis of asthma is made by a review of your medical history and a physical exam. Tests may also be performed. These may include:  Lung function studies. These tests show how much air you breath in and out.  Allergy  tests.  Imaging tests such as X-rays. TREATMENT  Asthma cannot be cured, but it can usually be controlled. Treatment involves identifying and avoiding your asthma triggers. It also involves medicines. There are 2 classes of medicine used for asthma treatment:   Controller medicines. These prevent asthma symptoms from occurring. They are usually taken every day.  Reliever or rescue medicines. These quickly relieve asthma symptoms. They are used as needed and provide short-term relief. Your health care provider will help you create an asthma action plan. An asthma action plan is a written plan for managing and treating your asthma attacks. It includes a list of your asthma triggers and how they may be avoided. It also includes information on when medicines should be taken and when their dosage should be changed. An action plan may also involve the use of a device called a peak flow meter. A peak flow meter measures how well the lungs are working. It helps you monitor your condition. HOME CARE INSTRUCTIONS   Take medicine as directed by your health care provider. Speak with your health care provider if you have questions about how or when to take the medicines.  Use a peak flow meter as directed by your health care provider. Record and keep track of readings.  Understand and use the action plan to help minimize or stop an asthma attack without needing to seek medical care.  Control your home environment in the following ways to help prevent asthma attacks:  Do not smoke. Avoid being exposed to secondhand smoke.  Change your heating and air conditioning filter regularly.  Limit your use of fireplaces and Kevyn Wengert stoves.  Get rid of pests (such as roaches and   mice) and their droppings.  Throw away plants if you see mold on them.  Clean your floors and dust regularly. Use unscented cleaning products.  Try to have someone else vacuum for you regularly. Stay out of rooms while they are being  vacuumed and for a short while afterward. If you vacuum, use a dust mask from a hardware store, a double-layered or microfilter vacuum cleaner bag, or a vacuum cleaner with a HEPA filter.  Replace carpet with Latavius Capizzi, tile, or vinyl flooring. Carpet can trap dander and dust.  Use allergy-proof pillows, mattress covers, and box spring covers.  Wash bed sheets and blankets every week in hot water and dry them in a dryer.  Use blankets that are made of polyester or cotton.  Clean bathrooms and kitchens with bleach. If possible, have someone repaint the walls in these rooms with mold-resistant paint. Keep out of the rooms that are being cleaned and painted.  Wash hands frequently. SEEK MEDICAL CARE IF:   You have wheezing, shortness of breath, or a cough even if taking medicine to prevent attacks.  The colored mucus you cough up (sputum) is thicker than usual.  Your sputum changes from clear or white to yellow, green, gray, or bloody.  You have any problems that may be related to the medicines you are taking (such as a rash, itching, swelling, or trouble breathing).  You are using a reliever medicine more than 2 3 times per week.  Your peak flow is still at 50 79% of you personal best after following your action plan for 1 hour. SEEK IMMEDIATE MEDICAL CARE IF:   You seem to be getting worse and are unresponsive to treatment during an asthma attack.  You are short of breath even at rest.  You get short of breath when doing very little physical activity.  You have difficulty eating, drinking, or talking due to asthma symptoms.  You develop chest pain.  You develop a fast heartbeat.  You have a bluish color to your lips or fingernails.  You are lightheaded, dizzy, or faint.  Your peak flow is less than 50% of your personal best.  You have a fever or persistent symptoms for more than 2 3 days.  You have a fever and symptoms suddenly get worse. MAKE SURE YOU:   Understand these  instructions.  Will watch your condition.  Will get help right away if you are not doing well or get worse. Document Released: 09/08/2005 Document Revised: 05/11/2013 Document Reviewed: 04/07/2013 [employer] Patient Information 2014 [employer].  

## 2013-12-08 ENCOUNTER — Ambulatory Visit: Payer: Self-pay

## 2013-12-08 ENCOUNTER — Ambulatory Visit: Payer: Self-pay | Attending: Internal Medicine | Admitting: Internal Medicine

## 2013-12-08 VITALS — BP 147/95 | HR 86 | Temp 99.0°F | Resp 16 | Ht 61.0 in | Wt 243.0 lb

## 2013-12-08 DIAGNOSIS — J45909 Unspecified asthma, uncomplicated: Secondary | ICD-10-CM

## 2013-12-08 DIAGNOSIS — E119 Type 2 diabetes mellitus without complications: Secondary | ICD-10-CM

## 2013-12-08 LAB — GLUCOSE, POCT (MANUAL RESULT ENTRY): POC GLUCOSE: 259 mg/dL — AB (ref 70–99)

## 2013-12-08 LAB — POCT GLYCOSYLATED HEMOGLOBIN (HGB A1C): Hemoglobin A1C: 10

## 2013-12-08 MED ORDER — IPRATROPIUM BROMIDE 0.02 % IN SOLN: 0.5000 mg | Freq: Four times a day (QID) | RESPIRATORY_TRACT | Status: DC

## 2013-12-08 NOTE — Progress Notes (Unsigned)
Patient ID: [name], female   DOB: 01-14-1971, 43 y.o.   MRN: [id number]   HPI: [name] is a 43 y.o. female presenting on 12/08/2013 for f/u of asthma exacerbation. Doing much better. Has one day of prednisone left.     Past Medical History  Diagnosis Date  . DM (diabetes mellitus)   . Hypertension   . Hypokalemia   . Asthma     Past Surgical History  Procedure Laterality Date  . Cesarean section      x 3  . Appendectomy    . Vesicovaginal fistula closure w/ tah  2009  . Hernia repair      Current Outpatient Prescriptions  Medication Sig Dispense Refill  . albuterol (PROVENTIL HFA;VENTOLIN HFA) 108 (90 BASE) MCG/ACT inhaler Inhale 2 puffs into the lungs every 6 (six) hours as needed for wheezing.  1 Inhaler  11  . albuterol (PROVENTIL) (2.5 MG/3ML) 0.083% nebulizer solution Take 3 mLs (2.5 mg total) by nebulization every 6 (six) hours as needed for wheezing.  75 mL  11  . amLODipine (NORVASC) 10 MG tablet Take 1 tablet (10 mg total) by mouth daily.  90 tablet  3  . aspirin EC 325 MG tablet Take 1 tablet (325 mg total) by mouth daily.  30 tablet  5  . benazepril-hydrochlorthiazide (LOTENSIN HCT) 20-25 MG per tablet Take 1 tablet by mouth daily.  30 tablet  11  . carvedilol (COREG) 3.125 MG tablet Take 1 tablet (3.125 mg total) by mouth 2 (two) times daily with a meal.  60 tablet  5  . cyclobenzaprine (FLEXERIL) 5 MG tablet Take 1 tablet (5 mg total) by mouth 3 (three) times daily as needed for muscle spasms.  90 tablet  2  . Dapagliflozin Propanediol 5 MG TABS Take 5 mg by mouth daily.  30 tablet  4  . Fluticasone-Salmeterol (ADVAIR DISKUS) 250-50 MCG/DOSE AEPB Inhale 1 puff into the lungs 2 (two) times daily.  1 each  3  . gabapentin (NEURONTIN) 400 MG capsule Take 1 capsule (400 mg total) by mouth 3 (three) times daily.  90 capsule  5  . glucose blood test strip Use as instructed  100 each  12  . glucose monitoring kit (FREESTYLE) monitoring kit 1 each by Does not  apply route 4 (four) times daily - after meals and at bedtime. 1 month Diabetic Testing Supplies for QAC-QHS accuchecks.  1 each  1  . insulin NPH-regular (RELION 70/30) (70-30) 100 UNIT/ML injection Inject 50 Units into the skin 2 (two) times daily with a meal.  10 mL  11  . meloxicam (MOBIC) 7.5 MG tablet Take 1 tablet (7.5 mg total) by mouth daily.  30 tablet  1  . metFORMIN (GLUCOPHAGE) 1000 MG tablet Take 1 tablet (1,000 mg total) by mouth 2 (two) times daily with a meal.  60 tablet  11  . predniSONE (DELTASONE) 10 MG tablet 6 tabs for 1 day, 5 tabs for 1 day, 4 tabs for 1 day, 3 tabs for 1 day, 2 tabs for 1 day, 1 tab for 1 day. Each day take all pills at the same time with food.  21 tablet  0  . traMADol (ULTRAM) 50 MG tablet Take 1-2 tablets (50-100 mg total) by mouth every 6 (six) hours as needed for moderate pain.  60 tablet  1  . ipratropium (ATROVENT) 0.02 % nebulizer solution Take 2.5 mLs (0.5 mg total) by nebulization 4 (four) times daily.  75  mL  12   No current facility-administered medications for this visit.    No Known Allergies  Family History  Problem Relation Age of Onset  . Heart attack Mother   . Stroke Mother   . Diabetes Mother   . Hypertension Mother   . Arthritis Mother   . Stroke Father   . Hypertension Sister   . Diabetes Sister   . Seizures Brother   . Diabetes Brother     History   Social History  . Marital Status: Married    Spouse Name: N/A    Number of Children: N/A  . Years of Education: N/A   Occupational History  . Not on file.   Social History Main Topics  . Smoking status: Former Smoker -- 0.30 packs/day for 22 years    Types: Cigarettes    Quit date: 05/01/2011  . Smokeless tobacco: Never Used  . Alcohol Use: Yes     Comment: rare  . Drug Use: No  . Sexual Activity: Not on file   Other Topics Concern  . Not on file   Social History Narrative  . No narrative on file    Review of Systems     Objective:  BP 147/95   Pulse 86  Temp(Src) 99 F (37.2 C) (Oral)  Resp 16  Ht 5' 1"  (1.549 m)  Wt 243 lb (110.224 kg)  BMI 45.94 kg/m2  SpO2 94% Filed Weights   12/08/13 1107  Weight: 243 lb (110.224 kg)     Physical Exam  Constitutional: Appears well-developed and well-nourished. No distress. HENT: Normocephalic. External right and left ear normal. Oropharynx is clear and moist.  Eyes: Conjunctivae and EOM are normal. PERRLA, no scleral icterus.  Neck: Normal ROM. Neck supple. No JVD. No tracheal deviation. No thyromegaly.  CVS: RRR, S1/S2 +, no murmurs, no gallops, no carotid bruit.  Pulmonary: Effort and breath sounds normal, no stridor, rhonchi, wheezes, rales.  Abdominal: Soft. BS +,  no distension, tenderness, rebound or guarding.  Musculoskeletal: Normal range of motion. No edema and no tenderness.  Neuro: Alert. Normal reflexes, muscle tone coordination. No cranial nerve deficit. Skin: Skin is warm and dry. No rash noted. Not diaphoretic. No erythema. No pallor.  Psychiatric: Normal mood and affect. Behavior, judgment, thought content normal.   Lab Results  Component Value Date   WBC 9.1 11/10/2013   HGB 12.7 11/10/2013   HCT 37.1 11/10/2013   MCV 86.3 11/10/2013   PLT 299 11/10/2013   Lab Results  Component Value Date   CREATININE 0.82 11/10/2013   BUN 11 11/10/2013   NA 137 11/10/2013   K 3.5* 11/10/2013   CL 97 11/10/2013   CO2 25 11/10/2013    Lab Results  Component Value Date   HGBA1C 9.9 09/05/2013   Lipid Panel     Component Value Date/Time   CHOL 176 05/05/2011 0215   TRIG 56 05/05/2011 0215   HDL 71 05/05/2011 0215   CHOLHDL 2.5 05/05/2011 0215   VLDL 11 05/05/2011 0215   LDLCALC 94 05/05/2011 0215        Patient Active Problem List   Diagnosis Date Noted  . DM (diabetes mellitus) type 2, uncontrolled, with ketoacidosis 11/10/2013  . Chest pain 11/10/2013  . Hypertensive urgency 11/10/2013  . Back pain 09/05/2013  . Essential hypertension, benign 09/05/2013  . Acute  bronchitis 05/01/2013  . Asthma exacerbation 04/27/2013  . Acute respiratory failure with hypoxia 04/27/2013  . Uncontrolled diabetes mellitus 04/27/2013  .  Obesity 04/27/2013  . Viral gastroenteritis 04/27/2013  . Allergic rhinitis, seasonal 08/11/2012  . Chronic cough 08/11/2012  . Sleep apnea, obstructive 12/04/2011     Preventative Medicine:  Health Maintenance  Topic Date Due  . Pap Smear  09/21/1987  . Tetanus/tdap  09/20/1988  . Influenza Vaccine  04/22/2013    Adult vaccines due  Topic Date Due  . Tetanus/tdap  09/20/1988       Assessment and plan:  Intrinsic asthma - add Atrovent- no inhalers in pharmacy -will add is as a neb- also asked to add Mucinex over the counter and drink plenty of water.    No Follow-up on file.   The patient was given clear instructions to go to ER or return to medical center if symptoms don't improve, worsen or new problems develop. The patient verbalized understanding. The patient was told to call to get lab results if they haven't heard anything in the next week.     Debbe Odea, MD

## 2013-12-08 NOTE — Progress Notes (Unsigned)
Pt is here following up on her diabetes and asthma.

## 2013-12-14 ENCOUNTER — Other Ambulatory Visit: Payer: Self-pay | Admitting: Internal Medicine

## 2013-12-14 DIAGNOSIS — J45901 Unspecified asthma with (acute) exacerbation: Secondary | ICD-10-CM

## 2013-12-14 MED ORDER — ALBUTEROL SULFATE HFA 108 (90 BASE) MCG/ACT IN AERS: 2.0000 | INHALATION_SPRAY | Freq: Four times a day (QID) | RESPIRATORY_TRACT | Status: DC | PRN

## 2013-12-14 MED ORDER — FLUTICASONE-SALMETEROL 250-50 MCG/DOSE IN AEPB: 1.0000 | INHALATION_SPRAY | Freq: Two times a day (BID) | RESPIRATORY_TRACT | Status: DC

## 2013-12-14 MED ORDER — INSULIN NPH ISOPHANE & REGULAR (70-30) 100 UNIT/ML ~~LOC~~ SUSP: 50.0000 [IU] | Freq: Two times a day (BID) | SUBCUTANEOUS | Status: DC

## 2014-02-26 ENCOUNTER — Encounter (HOSPITAL_COMMUNITY): Payer: Self-pay | Admitting: Emergency Medicine

## 2014-02-26 ENCOUNTER — Emergency Department (HOSPITAL_COMMUNITY): Payer: Self-pay

## 2014-02-26 ENCOUNTER — Observation Stay (HOSPITAL_COMMUNITY)
Admission: EM | Admit: 2014-02-26 | Discharge: 2014-03-04 | Disposition: A | Discharge status: Home / Self Care | Payer: Self-pay | Attending: Internal Medicine | Admitting: Internal Medicine

## 2014-02-26 DIAGNOSIS — Z791 Long term (current) use of non-steroidal anti-inflammatories (NSAID): Secondary | ICD-10-CM | POA: Insufficient documentation

## 2014-02-26 DIAGNOSIS — K760 Fatty (change of) liver, not elsewhere classified: Secondary | ICD-10-CM

## 2014-02-26 DIAGNOSIS — G8929 Other chronic pain: Secondary | ICD-10-CM | POA: Insufficient documentation

## 2014-02-26 DIAGNOSIS — J45909 Unspecified asthma, uncomplicated: Secondary | ICD-10-CM | POA: Insufficient documentation

## 2014-02-26 DIAGNOSIS — J45901 Unspecified asthma with (acute) exacerbation: Secondary | ICD-10-CM

## 2014-02-26 DIAGNOSIS — R1110 Vomiting, unspecified: Secondary | ICD-10-CM | POA: Diagnosis present

## 2014-02-26 DIAGNOSIS — Z7982 Long term (current) use of aspirin: Secondary | ICD-10-CM | POA: Insufficient documentation

## 2014-02-26 DIAGNOSIS — E669 Obesity, unspecified: Secondary | ICD-10-CM

## 2014-02-26 DIAGNOSIS — E1149 Type 2 diabetes mellitus with other diabetic neurological complication: Secondary | ICD-10-CM | POA: Insufficient documentation

## 2014-02-26 DIAGNOSIS — Z794 Long term (current) use of insulin: Secondary | ICD-10-CM | POA: Insufficient documentation

## 2014-02-26 DIAGNOSIS — Z87891 Personal history of nicotine dependence: Secondary | ICD-10-CM | POA: Insufficient documentation

## 2014-02-26 DIAGNOSIS — F411 Generalized anxiety disorder: Secondary | ICD-10-CM | POA: Insufficient documentation

## 2014-02-26 DIAGNOSIS — R109 Unspecified abdominal pain: Secondary | ICD-10-CM

## 2014-02-26 DIAGNOSIS — Z8249 Family history of ischemic heart disease and other diseases of the circulatory system: Secondary | ICD-10-CM | POA: Insufficient documentation

## 2014-02-26 DIAGNOSIS — Z6841 Body Mass Index (BMI) 40.0 and over, adult: Secondary | ICD-10-CM | POA: Insufficient documentation

## 2014-02-26 DIAGNOSIS — J9601 Acute respiratory failure with hypoxia: Secondary | ICD-10-CM

## 2014-02-26 DIAGNOSIS — R197 Diarrhea, unspecified: Secondary | ICD-10-CM | POA: Insufficient documentation

## 2014-02-26 DIAGNOSIS — K828 Other specified diseases of gallbladder: Secondary | ICD-10-CM | POA: Insufficient documentation

## 2014-02-26 DIAGNOSIS — I160 Hypertensive urgency: Secondary | ICD-10-CM

## 2014-02-26 DIAGNOSIS — M549 Dorsalgia, unspecified: Secondary | ICD-10-CM | POA: Insufficient documentation

## 2014-02-26 DIAGNOSIS — I10 Essential (primary) hypertension: Secondary | ICD-10-CM | POA: Diagnosis present

## 2014-02-26 DIAGNOSIS — R079 Chest pain, unspecified: Secondary | ICD-10-CM

## 2014-02-26 DIAGNOSIS — R0789 Other chest pain: Principal | ICD-10-CM | POA: Diagnosis present

## 2014-02-26 DIAGNOSIS — E119 Type 2 diabetes mellitus without complications: Secondary | ICD-10-CM

## 2014-02-26 DIAGNOSIS — E1142 Type 2 diabetes mellitus with diabetic polyneuropathy: Secondary | ICD-10-CM | POA: Insufficient documentation

## 2014-02-26 DIAGNOSIS — G4733 Obstructive sleep apnea (adult) (pediatric): Secondary | ICD-10-CM | POA: Insufficient documentation

## 2014-02-26 DIAGNOSIS — E1110 Type 2 diabetes mellitus with ketoacidosis without coma: Secondary | ICD-10-CM

## 2014-02-26 DIAGNOSIS — G43909 Migraine, unspecified, not intractable, without status migrainosus: Secondary | ICD-10-CM | POA: Insufficient documentation

## 2014-02-26 DIAGNOSIS — IMO0002 Reserved for concepts with insufficient information to code with codable children: Secondary | ICD-10-CM | POA: Insufficient documentation

## 2014-02-26 DIAGNOSIS — K219 Gastro-esophageal reflux disease without esophagitis: Secondary | ICD-10-CM | POA: Insufficient documentation

## 2014-02-26 DIAGNOSIS — K7689 Other specified diseases of liver: Secondary | ICD-10-CM | POA: Insufficient documentation

## 2014-02-26 HISTORY — DX: Depression, unspecified: F32.A

## 2014-02-26 HISTORY — DX: Gastro-esophageal reflux disease without esophagitis: K21.9

## 2014-02-26 HISTORY — DX: Major depressive disorder, single episode, unspecified: F32.9

## 2014-02-26 HISTORY — DX: Headache: R51

## 2014-02-26 LAB — GLUCOSE, CAPILLARY: GLUCOSE-CAPILLARY: 125 mg/dL — AB (ref 70–99)

## 2014-02-26 LAB — HEPATIC FUNCTION PANEL
ALBUMIN: 3.3 g/dL — AB (ref 3.5–5.2)
ALT: 18 U/L (ref 0–35)
AST: 15 U/L (ref 0–37)
Alkaline Phosphatase: 100 U/L (ref 39–117)
Bilirubin, Direct: <0.2 mg/dL (ref 0.0–0.3)
TOTAL PROTEIN: 6.9 g/dL (ref 6.0–8.3)
Total Bilirubin: <0.2 mg/dL — ABNORMAL LOW (ref 0.3–1.2)

## 2014-02-26 LAB — BASIC METABOLIC PANEL
BUN: 14 mg/dL (ref 6–23)
CHLORIDE: 103 meq/L (ref 96–112)
CO2: 23 meq/L (ref 19–32)
CREATININE: 0.85 mg/dL (ref 0.50–1.10)
Calcium: 9 mg/dL (ref 8.4–10.5)
GFR calc non Af Amer: 82 mL/min — ABNORMAL LOW (ref >90)
Glucose, Bld: 185 mg/dL — ABNORMAL HIGH (ref 70–99)
POTASSIUM: 3.7 meq/L (ref 3.7–5.3)
Sodium: 141 mEq/L (ref 137–147)

## 2014-02-26 LAB — CBC
HCT: 36.3 % (ref 36.0–46.0)
Hemoglobin: 12.2 g/dL (ref 12.0–15.0)
MCH: 29.3 pg (ref 26.0–34.0)
MCHC: 33.6 g/dL (ref 30.0–36.0)
MCV: 87.1 fL (ref 78.0–100.0)
Platelets: 317 10*3/uL (ref 150–400)
RBC: 4.17 MIL/uL (ref 3.87–5.11)
RDW: 13.9 % (ref 11.5–15.5)
WBC: 8.2 10*3/uL (ref 4.0–10.5)

## 2014-02-26 LAB — PRO B NATRIURETIC PEPTIDE: PRO B NATRI PEPTIDE: 5.7 pg/mL (ref 0–125)

## 2014-02-26 LAB — I-STAT TROPONIN, ED: Troponin i, poc: 0 ng/mL (ref 0.00–0.08)

## 2014-02-26 LAB — D-DIMER, QUANTITATIVE (NOT AT ARMC): D DIMER QUANT: 0.36 ug{FEU}/mL (ref 0.00–0.48)

## 2014-02-26 LAB — LIPASE, BLOOD: Lipase: 32 U/L (ref 11–59)

## 2014-02-26 MED ORDER — INSULIN ASPART 100 UNIT/ML ~~LOC~~ SOLN
0.0000 [IU] | Freq: Three times a day (TID) | SUBCUTANEOUS | Status: DC
  Administered 2014-02-27 (×2): 3 [IU] via SUBCUTANEOUS
  Administered 2014-02-27: 5 [IU] via SUBCUTANEOUS
  Administered 2014-02-28: 3 [IU] via SUBCUTANEOUS
  Administered 2014-02-28: 2 [IU] via SUBCUTANEOUS
  Administered 2014-03-01: 3 [IU] via SUBCUTANEOUS
  Administered 2014-03-01: 5 [IU] via SUBCUTANEOUS
  Administered 2014-03-01 – 2014-03-02 (×2): 3 [IU] via SUBCUTANEOUS
  Administered 2014-03-03: 5 [IU] via SUBCUTANEOUS
  Administered 2014-03-03: 3 [IU] via SUBCUTANEOUS
  Administered 2014-03-03 – 2014-03-04 (×2): 5 [IU] via SUBCUTANEOUS

## 2014-02-26 MED ORDER — MORPHINE SULFATE 2 MG/ML IJ SOLN
2.0000 mg | INTRAMUSCULAR | Status: DC | PRN
  Administered 2014-02-26 – 2014-02-28 (×7): 2 mg via INTRAVENOUS
  Administered 2014-03-02: 4 mg via INTRAVENOUS
  Filled 2014-02-26 (×6): qty 1
  Filled 2014-02-26: qty 2
  Filled 2014-02-26: qty 1

## 2014-02-26 MED ORDER — AMLODIPINE BESYLATE 10 MG PO TABS
10.0000 mg | ORAL_TABLET | Freq: Every day | ORAL | Status: DC
  Administered 2014-02-27 – 2014-03-04 (×6): 10 mg via ORAL
  Filled 2014-02-26 (×6): qty 1

## 2014-02-26 MED ORDER — MORPHINE SULFATE 4 MG/ML IJ SOLN
4.0000 mg | Freq: Once | INTRAMUSCULAR | Status: AC
  Administered 2014-02-26: 4 mg via INTRAVENOUS
  Filled 2014-02-26: qty 1

## 2014-02-26 MED ORDER — GABAPENTIN 400 MG PO CAPS
400.0000 mg | ORAL_CAPSULE | Freq: Three times a day (TID) | ORAL | Status: DC
  Administered 2014-02-26 – 2014-03-04 (×16): 400 mg via ORAL
  Filled 2014-02-26 (×19): qty 1

## 2014-02-26 MED ORDER — GI COCKTAIL ~~LOC~~
30.0000 mL | Freq: Once | ORAL | Status: AC
  Administered 2014-02-26: 30 mL via ORAL
  Filled 2014-02-26: qty 30

## 2014-02-26 MED ORDER — ACETAMINOPHEN 650 MG RE SUPP: 650.0000 mg | Freq: Four times a day (QID) | RECTAL | Status: DC | PRN

## 2014-02-26 MED ORDER — CARVEDILOL 3.125 MG PO TABS
3.1250 mg | ORAL_TABLET | Freq: Two times a day (BID) | ORAL | Status: DC
  Administered 2014-02-27 – 2014-03-04 (×10): 3.125 mg via ORAL
  Filled 2014-02-26 (×13): qty 1

## 2014-02-26 MED ORDER — PANTOPRAZOLE SODIUM 40 MG IV SOLR
40.0000 mg | Freq: Two times a day (BID) | INTRAVENOUS | Status: DC
  Administered 2014-02-26 – 2014-02-28 (×5): 40 mg via INTRAVENOUS
  Filled 2014-02-26 (×7): qty 40

## 2014-02-26 MED ORDER — CYCLOBENZAPRINE HCL 5 MG PO TABS
5.0000 mg | ORAL_TABLET | Freq: Three times a day (TID) | ORAL | Status: DC | PRN
  Administered 2014-03-03: 5 mg via ORAL
  Filled 2014-02-26 (×2): qty 1

## 2014-02-26 MED ORDER — POTASSIUM CHLORIDE IN NACL 20-0.9 MEQ/L-% IV SOLN
INTRAVENOUS | Status: DC
  Administered 2014-02-26: 23:00:00 via INTRAVENOUS
  Filled 2014-02-26 (×2): qty 1000

## 2014-02-26 MED ORDER — NITROGLYCERIN 0.4 MG SL SUBL
0.4000 mg | SUBLINGUAL_TABLET | SUBLINGUAL | Status: AC | PRN
  Administered 2014-02-26 (×3): 0.4 mg via SUBLINGUAL
  Filled 2014-02-26: qty 1

## 2014-02-26 MED ORDER — ENOXAPARIN SODIUM 40 MG/0.4ML ~~LOC~~ SOLN
40.0000 mg | SUBCUTANEOUS | Status: DC
  Administered 2014-02-26 – 2014-03-01 (×4): 40 mg via SUBCUTANEOUS
  Filled 2014-02-26 (×6): qty 0.4

## 2014-02-26 MED ORDER — TRAMADOL HCL 50 MG PO TABS
50.0000 mg | ORAL_TABLET | Freq: Four times a day (QID) | ORAL | Status: DC | PRN
  Administered 2014-02-27 – 2014-03-03 (×8): 100 mg via ORAL
  Filled 2014-02-26 (×8): qty 2

## 2014-02-26 MED ORDER — ALBUTEROL SULFATE (2.5 MG/3ML) 0.083% IN NEBU: 2.5000 mg | INHALATION_SOLUTION | Freq: Four times a day (QID) | RESPIRATORY_TRACT | Status: DC | PRN

## 2014-02-26 MED ORDER — INSULIN ASPART 100 UNIT/ML ~~LOC~~ SOLN
0.0000 [IU] | Freq: Every day | SUBCUTANEOUS | Status: DC
Start: 2014-02-26 — End: 2014-03-04
  Administered 2014-02-27: 3 [IU] via SUBCUTANEOUS
  Administered 2014-02-28: 2 [IU] via SUBCUTANEOUS

## 2014-02-26 MED ORDER — ACETAMINOPHEN 500 MG PO TABS
1000.0000 mg | ORAL_TABLET | ORAL | Status: DC | PRN
Start: 2014-02-26 — End: 2014-02-26
  Administered 2014-02-26: 1000 mg via ORAL
  Filled 2014-02-26: qty 2

## 2014-02-26 MED ORDER — BENAZEPRIL HCL 20 MG PO TABS
20.0000 mg | ORAL_TABLET | Freq: Every day | ORAL | Status: DC
  Administered 2014-02-27 – 2014-03-04 (×6): 20 mg via ORAL
  Filled 2014-02-26 (×6): qty 1

## 2014-02-26 MED ORDER — ASPIRIN EC 81 MG PO TBEC
81.0000 mg | DELAYED_RELEASE_TABLET | Freq: Every day | ORAL | Status: DC
  Administered 2014-02-27 – 2014-03-04 (×6): 81 mg via ORAL
  Filled 2014-02-26 (×6): qty 1

## 2014-02-26 MED ORDER — INSULIN DETEMIR 100 UNIT/ML ~~LOC~~ SOLN
10.0000 [IU] | Freq: Every day | SUBCUTANEOUS | Status: DC
  Administered 2014-02-27 – 2014-03-03 (×5): 10 [IU] via SUBCUTANEOUS
  Filled 2014-02-26 (×7): qty 0.1

## 2014-02-26 MED ORDER — MOMETASONE FURO-FORMOTEROL FUM 100-5 MCG/ACT IN AERO
2.0000 | INHALATION_SPRAY | Freq: Two times a day (BID) | RESPIRATORY_TRACT | Status: DC
  Administered 2014-02-27 – 2014-03-04 (×11): 2 via RESPIRATORY_TRACT
  Filled 2014-02-26: qty 8.8

## 2014-02-26 MED ORDER — ACETAMINOPHEN 325 MG PO TABS: 650.0000 mg | ORAL_TABLET | Freq: Four times a day (QID) | ORAL | Status: DC | PRN

## 2014-02-26 MED ORDER — ONDANSETRON HCL 4 MG/2ML IJ SOLN
4.0000 mg | Freq: Once | INTRAMUSCULAR | Status: AC
  Administered 2014-02-26: 4 mg via INTRAVENOUS
  Filled 2014-02-26: qty 2

## 2014-02-26 NOTE — ED Notes (Signed)
C/o pressure in center of chest x 4 days with intermittent sharp chest pain and intermittent radiation to back.  Also c/o sob, diaphoresis, dizziness, nausea, vomiting.  Reports vomited x 3 today.

## 2014-02-26 NOTE — ED Notes (Signed)
Jarrett Soho, PA at the bedside. MAde aware of the pain.

## 2014-02-26 NOTE — ED Provider Notes (Signed)
Medical screening examination/treatment/procedure(s) were performed by non-physician practitioner and as supervising physician I was immediately available for consultation/collaboration.   EKG Interpretation None       Threasa Beards, MD 02/26/14 2047

## 2014-02-26 NOTE — ED Provider Notes (Signed)
CSN: [id number]     Arrival date & time 02/26/14  1741 History   First MD Initiated Contact with Patient 02/26/14 1822     Chief Complaint  Patient presents with  . Chest Pain     (Consider location/radiation/quality/duration/timing/severity/associated sxs/prior Treatment) HPI Comments: Patient is a 43 year old female with history of diabetes, hypertension, and asthma who presents today with chest pain. She states that the chest pain began yesterday and has worsened today. It is a pressure-like sensation in the center of her chest. It radiates into her back. Today around 2 PM she had an episode of diaphoresis. This diaphoresis lasted approximately 15 minutes. She has had nausea and vomited 3 times today. She had a stress test many years ago, but cannot remember why or the results of the stress test. She is a positive family history with her father dying of a heart attack at 39 years old. She does not smoke cigarettes. She took 325 mg of aspirin earlier today. She continues to have chest pain.  Patient is a 43 y.o. female presenting with chest pain. The history is provided by the patient. No language interpreter was used.  Chest Pain Associated symptoms: diaphoresis, nausea, shortness of breath and vomiting   Associated symptoms: no abdominal pain and no fever     Past Medical History  Diagnosis Date  . DM (diabetes mellitus)   . Hypertension   . Hypokalemia   . Asthma    Past Surgical History  Procedure Laterality Date  . Cesarean section      x 3  . Appendectomy    . Vesicovaginal fistula closure w/ tah  2009  . Hernia repair     Family History  Problem Relation Age of Onset  . Heart attack Mother   . Stroke Mother   . Diabetes Mother   . Hypertension Mother   . Arthritis Mother   . Stroke Father   . Hypertension Sister   . Diabetes Sister   . Seizures Brother   . Diabetes Brother    History  Substance Use Topics  . Smoking status: Former Smoker -- 0.30 packs/day for  22 years    Types: Cigarettes    Quit date: 05/01/2011  . Smokeless tobacco: Never Used  . Alcohol Use: Yes     Comment: rare   OB History   Grav Para Term Preterm Abortions TAB SAB Ect Mult Living                 Review of Systems  Constitutional: Positive for diaphoresis. Negative for fever and chills.  Respiratory: Positive for shortness of breath.   Cardiovascular: Positive for chest pain.  Gastrointestinal: Positive for nausea and vomiting. Negative for abdominal pain.  All other systems reviewed and are negative.     Allergies  Review of patient's allergies indicates no known allergies.  Home Medications   Prior to Admission medications   Medication Sig Start Date End Date Taking? Authorizing Provider  albuterol (PROVENTIL HFA;VENTOLIN HFA) 108 (90 BASE) MCG/ACT inhaler Inhale 2 puffs into the lungs every 6 (six) hours as needed for wheezing. 12/14/13   Angelica Chessman, MD  albuterol (PROVENTIL) (2.5 MG/3ML) 0.083% nebulizer solution Take 3 mLs (2.5 mg total) by nebulization every 6 (six) hours as needed for wheezing. 12/01/13   Doran Heater, MD  amLODipine (NORVASC) 10 MG tablet Take 1 tablet (10 mg total) by mouth daily. 09/05/13   Ripudeep Krystal Eaton, MD  aspirin EC 325 MG tablet Take 1  tablet (325 mg total) by mouth daily. 09/05/13   Ripudeep Krystal Eaton, MD  benazepril-hydrochlorthiazide (LOTENSIN HCT) 20-25 MG per tablet Take 1 tablet by mouth daily. 09/05/13   Ripudeep Krystal Eaton, MD  carvedilol (COREG) 3.125 MG tablet Take 1 tablet (3.125 mg total) by mouth 2 (two) times daily with a meal. 09/05/13   Ripudeep Krystal Eaton, MD  cyclobenzaprine (FLEXERIL) 5 MG tablet Take 1 tablet (5 mg total) by mouth 3 (three) times daily as needed for muscle spasms. 09/05/13   Ripudeep Krystal Eaton, MD  Dapagliflozin Propanediol 5 MG TABS Take 5 mg by mouth daily. 09/05/13   Ripudeep Krystal Eaton, MD  Fluticasone-Salmeterol (ADVAIR DISKUS) 250-50 MCG/DOSE AEPB Inhale 1 puff into the lungs 2 (two) times daily.  12/14/13   Angelica Chessman, MD  gabapentin (NEURONTIN) 400 MG capsule Take 1 capsule (400 mg total) by mouth 3 (three) times daily. 09/05/13   Ripudeep Krystal Eaton, MD  glucose blood test strip Use as instructed 12/01/13   Doran Heater, MD  glucose monitoring kit (FREESTYLE) monitoring kit 1 each by Does not apply route 4 (four) times daily - after meals and at bedtime. 1 month Diabetic Testing Supplies for QAC-QHS accuchecks. 09/05/13   Ripudeep Krystal Eaton, MD  insulin NPH-regular Human (RELION 70/30) (70-30) 100 UNIT/ML injection Inject 50 Units into the skin 2 (two) times daily with a meal. 12/14/13   Angelica Chessman, MD  ipratropium (ATROVENT) 0.02 % nebulizer solution Take 2.5 mLs (0.5 mg total) by nebulization 4 (four) times daily. 12/08/13   Debbe Odea, MD  meloxicam (MOBIC) 7.5 MG tablet Take 1 tablet (7.5 mg total) by mouth daily. 12/01/13   Doran Heater, MD  metFORMIN (GLUCOPHAGE) 1000 MG tablet Take 1 tablet (1,000 mg total) by mouth 2 (two) times daily with a meal. 08/03/13   Thurnell Lose, MD  predniSONE (DELTASONE) 10 MG tablet 6 tabs for 1 day, 5 tabs for 1 day, 4 tabs for 1 day, 3 tabs for 1 day, 2 tabs for 1 day, 1 tab for 1 day. Each day take all pills at the same time with food. 12/01/13   Doran Heater, MD  traMADol (ULTRAM) 50 MG tablet Take 1-2 tablets (50-100 mg total) by mouth every 6 (six) hours as needed for moderate pain. 09/05/13   Ripudeep K Rai, MD   BP 141/78  Pulse 82  Temp(Src) 98.2 F (36.8 C) (Oral)  Resp 20  SpO2 98% Physical Exam  Nursing note and vitals reviewed. Constitutional: She is oriented to person, place, and time. She appears well-developed and well-nourished. She appears distressed.  HENT:  Head: Normocephalic and atraumatic.  Right Ear: External ear normal.  Left Ear: External ear normal.  Nose: Nose normal.  Mouth/Throat: Oropharynx is clear and moist.  Eyes: Conjunctivae are normal.  Neck: Normal range of motion.  Cardiovascular: Normal rate,  regular rhythm, normal heart sounds, intact distal pulses and normal pulses.   Pulses:      Radial pulses are 2+ on the right side, and 2+ on the left side.       Posterior tibial pulses are 2+ on the right side, and 2+ on the left side.  Pulmonary/Chest: Effort normal and breath sounds normal. No stridor. No respiratory distress. She has no wheezes. She has no rales.    Abdominal: Soft. She exhibits no distension.  Musculoskeletal: Normal range of motion.  Neurological: She is alert and oriented to person, place, and time. She has normal strength.  Skin: Skin is warm and dry. She is not diaphoretic. No erythema.  Psychiatric: She has a normal mood and affect. Her behavior is normal.    ED Course  Procedures (including critical care time) Labs Review Labs Reviewed  BASIC METABOLIC PANEL - Abnormal; Notable for the following:    Glucose, Bld 185 (*)    GFR calc non Af Amer 82 (*)    All other components within normal limits  HEPATIC FUNCTION PANEL - Abnormal; Notable for the following:    Albumin 3.3 (*)    Total Bilirubin <0.2 (*)    All other components within normal limits  CBC  PRO B NATRIURETIC PEPTIDE  LIPASE, BLOOD  D-DIMER, QUANTITATIVE  I-STAT TROPOININ, ED    Imaging Review Dg Chest 2 View  02/26/2014   CLINICAL DATA:  Chest pain.  Shortness of breath.  Asthma.  EXAM: CHEST  2 VIEW  COMPARISON:  11/10/13  FINDINGS: The heart size and mediastinal contours are within normal limits. Both lungs are clear. The visualized skeletal structures are unremarkable.  IMPRESSION: No active cardiopulmonary disease.   Electronically Signed   By: Earle Gell M.D.   On: 02/26/2014 19:06     EKG Interpretation None      MDM   Final diagnoses:  Chest pain  Diabetes  Hypertension   Patient presents to ED for evaluation of chest pain. She has significant positive family history with her father dying of MI at 47. She is hypertensive, diabetic, and obese. Initial troponin negative.  D dimer negative. Patient had similar symptoms in February where she was encouraged to stay in the hospital, but declined at that time. She did not follow up as an outpatient. I believe patient would benefit from admission to hospital for ACS rule out. Discussed this with patient who is agreeable with plan. Vital signs stable. Discussed case with Dr. Conley Canal who will be down to evaluate patient. Admission and evaluation appreciated. Discussed case with Dr. Canary Brim who agrees with plan. Patient / Family / Caregiver informed of clinical course, understand medical decision-making process, and agree with plan.   Elwyn Lade, PA-C 02/26/14 2044

## 2014-02-26 NOTE — H&P (Addendum)
[facility] History and Physical  [name] MWU:[id number] DOB: 12/02/1970 DOA: 02/26/2014  Referring physician: edp PCP: Angelica Chessman, MD   Chief Complaint: chest pain  HPI: [name] is a 43 y.o. female  With DM, HTN presents with a several day history of continuous substernal chest pain. Initially she thought it was heartburn and had no relief with tums. She tried nonsteroidal anti-inflammatories and tells me she took an entire bottle of Aleve over the week. The pain is pressure-like, but also "pins and needles". Worse with inspiration. Feels better when she is lying on her side. This started initially while asleep. No change with exertion. She vomited several times today. She had diarrhea all night last night. No abdominal pain. She felt hot and sweaty as well. No sick contacts, recent travel or suspect foods. She reports having had a negative stress test in [place] many years ago. She quit smoking 3 years ago. Both parents with heart disease. She has never had EGD. She does report reflux symptoms. She is not on PPI.  She takes Mobic daily and recently completed a course of prednisone for asthma exacerbation. She received 3 nitroglycerin without relief. Morphine brought her pain down to a "7". EKG and troponin are normal. She is afebrile. Chest x-ray negative. White blood cell count normal. Liver function tests and lipase normal.   Review of Systems:    Past Medical History  Diagnosis Date  . DM (diabetes mellitus)   . Hypertension   . Hypokalemia   . Asthma    Past Surgical History  Procedure Laterality Date  . Cesarean section      x 3  . Appendectomy    . Vesicovaginal fistula closure w/ tah  2009  . Hernia repair     Social History:  reports that she quit smoking about 2 years ago. Her smoking use included Cigarettes. She has a 6.6 pack-year smoking history. She has never used smokeless tobacco. She reports that she drinks alcohol occasionally.  She reports that she does not use illicit drugs. unemployed  No Known Allergies  Family History  Problem Relation Age of Onset  . Heart attack Mother   . Stroke Mother   . Diabetes Mother   . Hypertension Mother   . Arthritis Mother   . Stroke Father   . Hypertension Sister   . Diabetes Sister   . Seizures Brother   . Diabetes Brother      Prior to Admission medications   Medication Sig Start Date End Date Taking? Authorizing Provider  albuterol (PROVENTIL HFA;VENTOLIN HFA) 108 (90 BASE) MCG/ACT inhaler Inhale 2 puffs into the lungs every 6 (six) hours as needed for wheezing. 12/14/13  Yes Angelica Chessman, MD  albuterol (PROVENTIL) (2.5 MG/3ML) 0.083% nebulizer solution Take 3 mLs (2.5 mg total) by nebulization every 6 (six) hours as needed for wheezing. 12/01/13  Yes Doran Heater, MD  amLODipine (NORVASC) 10 MG tablet Take 1 tablet (10 mg total) by mouth daily. 09/05/13  Yes Ripudeep Krystal Eaton, MD  aspirin EC 325 MG tablet Take 1 tablet (325 mg total) by mouth daily. 09/05/13  Yes Ripudeep Krystal Eaton, MD  benazepril-hydrochlorthiazide (LOTENSIN HCT) 20-25 MG per tablet Take 1 tablet by mouth daily. 09/05/13  Yes Ripudeep Krystal Eaton, MD  carvedilol (COREG) 3.125 MG tablet Take 1 tablet (3.125 mg total) by mouth 2 (two) times daily with a meal. 09/05/13  Yes Ripudeep K Rai, MD  cyclobenzaprine (FLEXERIL) 5 MG tablet Take 1  tablet (5 mg total) by mouth 3 (three) times daily as needed for muscle spasms. 09/05/13  Yes Ripudeep Krystal Eaton, MD  Dapagliflozin Propanediol 5 MG TABS Take 5 mg by mouth daily. 09/05/13  Yes Ripudeep Krystal Eaton, MD  Fluticasone-Salmeterol (ADVAIR DISKUS) 250-50 MCG/DOSE AEPB Inhale 1 puff into the lungs 2 (two) times daily. 12/14/13  Yes Angelica Chessman, MD  gabapentin (NEURONTIN) 400 MG capsule Take 1 capsule (400 mg total) by mouth 3 (three) times daily. 09/05/13  Yes Ripudeep Krystal Eaton, MD  glucose blood test strip Use as instructed 12/01/13  Yes Doran Heater, MD  glucose monitoring  kit (FREESTYLE) monitoring kit 1 each by Does not apply route 4 (four) times daily - after meals and at bedtime. 1 month Diabetic Testing Supplies for QAC-QHS accuchecks. 09/05/13  Yes Ripudeep Krystal Eaton, MD  insulin NPH-regular Human (RELION 70/30) (70-30) 100 UNIT/ML injection Inject 50 Units into the skin 2 (two) times daily with a meal. 12/14/13  Yes Angelica Chessman, MD  ipratropium (ATROVENT) 0.02 % nebulizer solution Take 2.5 mLs (0.5 mg total) by nebulization 4 (four) times daily. 12/08/13  Yes Debbe Odea, MD  meloxicam (MOBIC) 7.5 MG tablet Take 1 tablet (7.5 mg total) by mouth daily. 12/01/13  Yes Doran Heater, MD  metFORMIN (GLUCOPHAGE) 1000 MG tablet Take 1 tablet (1,000 mg total) by mouth 2 (two) times daily with a meal. 08/03/13  Yes Thurnell Lose, MD  traMADol (ULTRAM) 50 MG tablet Take 1-2 tablets (50-100 mg total) by mouth every 6 (six) hours as needed for moderate pain. 09/05/13  Yes Ripudeep Krystal Eaton, MD   Physical Exam: Filed Vitals:   02/26/14 2102  BP: 121/66  Pulse: 89  Temp:   Resp: 22    BP 121/66  Pulse 89  Temp(Src) 98.2 F (36.8 C) (Oral)  Resp 22  SpO2 100%  BP 119/74  Pulse 73  Temp(Src) 98.2 F (36.8 C) (Oral)  Resp 15  SpO2 98%  General Appearance:    Alert, cooperative, uncomfortable appearing  Head:    Normocephalic, without obvious abnormality, atraumatic  Eyes:    PERRL, conjunctiva/corneas clear, EOM's intact, fundi    benign, both eyes  Ears:    Normal TM's and external ear canals, both ears  Nose:   Nares normal, septum midline, mucosa normal, no drainage    or sinus tenderness  Throat:   Lips, mucosa, and tongue normal; teeth and gums normal  Neck:   Supple, symmetrical, trachea midline, no adenopathy;    thyroid:  no enlargement/tenderness/nodules; no carotid   bruit or JVD  Back:     Symmetric, no curvature, ROM normal, no CVA tenderness  Lungs:     Clear to auscultation bilaterally, respirations unlabored  Chest Wall:    No  tenderness or deformity   Heart:    Regular rate and rhythm, S1 and S2 normal, no murmur, rub   or gallop     Abdomen:     Soft, non-tender, bowel sounds active obese  Genitalia:   deferred  Rectal:   deferred  Extremities:   Extremities normal, atraumatic, no cyanosis or edema  Pulses:   2+ and symmetric all extremities  Skin:   Skin color, texture, turgor normal, no rashes or lesions  Lymph nodes:   Cervical, supraclavicular, and axillary nodes normal  Neurologic:   CNII-XII intact, normal strength, sensation and reflexes    throughout             Psych:  anxious appearing  Labs on Admission:  Basic Metabolic Panel:  Recent Labs Lab 02/26/14 1810  NA 141  K 3.7  CL 103  CO2 23  GLUCOSE 185*  BUN 14  CREATININE 0.85  CALCIUM 9.0   Liver Function Tests:  Recent Labs Lab 02/26/14 1757  AST 15  ALT 18  ALKPHOS 100  BILITOT <0.2*  PROT 6.9  ALBUMIN 3.3*    Recent Labs Lab 02/26/14 1757  LIPASE 32   No results found for this basename: AMMONIA,  in the last 168 hours CBC:  Recent Labs Lab 02/26/14 1810  WBC 8.2  HGB 12.2  HCT 36.3  MCV 87.1  PLT 317   Cardiac Enzymes: No results found for this basename: CKTOTAL, CKMB, CKMBINDEX, TROPONINI,  in the last 168 hours  BNP (last 3 results)  Recent Labs  02/26/14 1810  PROBNP 5.7   CBG: No results found for this basename: GLUCAP,  in the last 168 hours  Radiological Exams on Admission: Dg Chest 2 View  02/26/2014   CLINICAL DATA:  Chest pain.  Shortness of breath.  Asthma.  EXAM: CHEST  2 VIEW  COMPARISON:  11/10/13  FINDINGS: The heart size and mediastinal contours are within normal limits. Both lungs are clear. The visualized skeletal structures are unremarkable.  IMPRESSION: No active cardiopulmonary disease.   Electronically Signed   By: Earle Gell M.D.   On: 02/26/2014 19:06    EKG: nsr, normal  Assessment/Plan Principal Problem:   Atypical chest pain:  Doubt ACS, as she's had several days  of continuous chest pain with a normal EKG and normal troponin. Suspect GI etiology, given her vomiting and diarrhea as well as reflux symptoms. She also takes Mobic and frequent Aleve. May be gastritis versus esophagitis versus ulcer disease. Likely has a component of chronic GERD. Also consider gastroparesis contributing. Will try a GI cocktail and give Protonix. Observe overnight. Clear liquids for now. N.p.o. after midnight in case any testing needed. She has multiple cardiac risk factors so will  rule out MI and check an echocardiogram. If rules out and pain continues, may need to consider EGD. Active Problems:   Obesity   Essential hypertension, benign   DM type 2 (diabetes mellitus, type 2): Sliding scale only for now.   Stable asthma   Vomiting and diarrhea: Clear liquids for now then n.p.o. after midnight. She reports she has had no diarrhea today. Could be gastroenteritis.  Code Status: full Family Communication: at bedside Disposition Plan: home  Time spent: 82 min  [facility] Pager [phone]

## 2014-02-26 NOTE — ED Notes (Signed)
Jarrett Soho, PA at the bedside. Confirmed patient is able to go to xray.

## 2014-02-26 NOTE — ED Notes (Signed)
Internal Medicine MD at the bedside.

## 2014-02-26 NOTE — ED Notes (Addendum)
Patient returned from [place]. Patient reported already taken aspirin earlier today.

## 2014-02-26 NOTE — ED Notes (Signed)
Initial EKG at 1749 with muscle tremors.  EKG repeated at 1753.

## 2014-02-27 DIAGNOSIS — R079 Chest pain, unspecified: Secondary | ICD-10-CM

## 2014-02-27 DIAGNOSIS — E669 Obesity, unspecified: Secondary | ICD-10-CM

## 2014-02-27 DIAGNOSIS — R072 Precordial pain: Secondary | ICD-10-CM

## 2014-02-27 LAB — GLUCOSE, CAPILLARY
Glucose-Capillary: 159 mg/dL — ABNORMAL HIGH (ref 70–99)
Glucose-Capillary: 224 mg/dL — ABNORMAL HIGH (ref 70–99)
Glucose-Capillary: 180 mg/dL — ABNORMAL HIGH (ref 70–99)

## 2014-02-27 LAB — HEMOGLOBIN A1C
Hgb A1c MFr Bld: 9.4 % — ABNORMAL HIGH (ref <5.7)
Mean Plasma Glucose: 223 mg/dL — ABNORMAL HIGH (ref <117)

## 2014-02-27 LAB — TROPONIN I
Troponin I: <0.3 ng/mL (ref <0.30)
Troponin I: <0.3 ng/mL (ref <0.30)

## 2014-02-27 MED ORDER — SODIUM CHLORIDE 0.9 % IV SOLN
INTRAVENOUS | Status: AC
  Administered 2014-02-27: 17:00:00 via INTRAVENOUS

## 2014-02-27 NOTE — Consult Note (Signed)
CARDIOLOGY CONSULT NOTE  Patient ID: [name] MRN: [id number] DOB/AGE: 45-Aug-1972 45 y.o.  Admit date: 02/26/2014 Reason for Consultation: chest pain  HPI: 43 yo with history of type II diabetes and HTN presented with chest pain.  Patient developed the pain about a week ago.  It starts in her epigastric area and radiates up to her neck.  It has been constant with some waxing and waning for about a week.  Tums did not help. Pain is worse with deep breathing.  Given persistence of pain, she came to the ER.  She looks uncomfortable in bed.  Workup so far unremarkable.  CXR normal, lipase/LFTs negative.  Cardiac enzymes, D dimer, and BNP normal.  ECG normal.  No nausea/vomiting. No diarrhea.  She quit smoking in 2012.  She has a strong family history of CAD with mother having MI at 61 and father also having MI (not sure age).  She had a negative stress test several years ago. She does also have a history of GERD.  In ER, NTG did not help.  Morphine helped some but she is still in pain.   Review of systems complete and found to be negative unless listed above in HPI  Past Medical History: 1. Type II diabetes 2. HTN 3. Asthma 4. H/o appendectomy 5. Vesicovaginal fistula s/p repair.   Family History  Problem Relation Age of Onset  . Heart attack Mother   . Stroke Mother   . Diabetes Mother   . Hypertension Mother   . Arthritis Mother   . Stroke Father   . Hypertension Sister   . Diabetes Sister   . Seizures Brother   . Diabetes Brother     History   Social History  . Marital Status: Married    Spouse Name: N/A    Number of Children: N/A  . Years of Education: N/A   Occupational History  . Not on file.   Social History Main Topics  . Smoking status: Former Smoker -- 0.30 packs/day for 22 years    Types: Cigarettes    Quit date: 05/01/2011  . Smokeless tobacco: Never Used  . Alcohol Use: Yes     Comment: rare  . Drug Use: No  . Sexual Activity: Not on file    Other Topics Concern  . Not on file   Social History Narrative  . No narrative on file     Prescriptions prior to admission  Medication Sig Dispense Refill  . albuterol (PROVENTIL HFA;VENTOLIN HFA) 108 (90 BASE) MCG/ACT inhaler Inhale 2 puffs into the lungs every 6 (six) hours as needed for wheezing.  3 Inhaler  3  . albuterol (PROVENTIL) (2.5 MG/3ML) 0.083% nebulizer solution Take 3 mLs (2.5 mg total) by nebulization every 6 (six) hours as needed for wheezing.  75 mL  11  . amLODipine (NORVASC) 10 MG tablet Take 1 tablet (10 mg total) by mouth daily.  90 tablet  3  . aspirin EC 325 MG tablet Take 1 tablet (325 mg total) by mouth daily.  30 tablet  5  . benazepril-hydrochlorthiazide (LOTENSIN HCT) 20-25 MG per tablet Take 1 tablet by mouth daily.  30 tablet  11  . carvedilol (COREG) 3.125 MG tablet Take 1 tablet (3.125 mg total) by mouth 2 (two) times daily with a meal.  60 tablet  5  . cyclobenzaprine (FLEXERIL) 5 MG tablet Take 1 tablet (5 mg total) by mouth 3 (three) times daily as needed for muscle spasms.  90 tablet  2  . Dapagliflozin Propanediol 5 MG TABS Take 5 mg by mouth daily.  30 tablet  4  . Fluticasone-Salmeterol (ADVAIR DISKUS) 250-50 MCG/DOSE AEPB Inhale 1 puff into the lungs 2 (two) times daily.  3 each  3  . gabapentin (NEURONTIN) 400 MG capsule Take 1 capsule (400 mg total) by mouth 3 (three) times daily.  90 capsule  5  . glucose blood test strip Use as instructed  100 each  12  . glucose monitoring kit (FREESTYLE) monitoring kit 1 each by Does not apply route 4 (four) times daily - after meals and at bedtime. 1 month Diabetic Testing Supplies for QAC-QHS accuchecks.  1 each  1  . insulin NPH-regular Human (RELION 70/30) (70-30) 100 UNIT/ML injection Inject 50 Units into the skin 2 (two) times daily with a meal.  12 vial  2  . ipratropium (ATROVENT) 0.02 % nebulizer solution Take 2.5 mLs (0.5 mg total) by nebulization 4 (four) times daily.  75 mL  12  . meloxicam  (MOBIC) 7.5 MG tablet Take 1 tablet (7.5 mg total) by mouth daily.  30 tablet  1  . metFORMIN (GLUCOPHAGE) 1000 MG tablet Take 1 tablet (1,000 mg total) by mouth 2 (two) times daily with a meal.  60 tablet  11  . traMADol (ULTRAM) 50 MG tablet Take 1-2 tablets (50-100 mg total) by mouth every 6 (six) hours as needed for moderate pain.  60 tablet  1    Physical exam Blood pressure 127/63, pulse 64, temperature 97.8 F (36.6 C), temperature source Oral, resp. rate 18, height _0  (1.549 m), weight 108.863 kg (240 lb), SpO2 98.00%. General: NAD, obese Neck: No JVD, no thyromegaly or thyroid nodule.  Lungs: Clear to auscultation bilaterally with normal respiratory effort. CV: Nondisplaced PMI.  Heart regular S1/S2, no S3/S4, no murmur.  No peripheral edema.  No carotid bruit.  Normal pedal pulses.  Abdomen: Soft, nontender, no hepatosplenomegaly, no distention.  Skin: Intact without lesions or rashes.  Neurologic: Alert and oriented x 3.  Psych: Normal affect. Extremities: No clubbing or cyanosis.  HEENT: Normal.   Labs:   Lab Results  Component Value Date   WBC 8.2 02/26/2014   HGB 12.2 02/26/2014   HCT 36.3 02/26/2014   MCV 87.1 02/26/2014   PLT 317 02/26/2014    Recent Labs Lab 02/26/14 1757 02/26/14 1810  NA  --  141  K  --  3.7  CL  --  103  CO2  --  23  BUN  --  14  CREATININE  --  0.85  CALCIUM  --  9.0  PROT 6.9  --   BILITOT <0.2*  --   ALKPHOS 100  --   ALT 18  --   AST 15  --   GLUCOSE  --  185*   Lab Results  Component Value Date   CKTOTAL 79 05/08/2011   CKMB 1.5 05/08/2011   TROPONINI <0.30 02/27/2014      Radiology: - CXR: No active disease.  EKG: NSR, normal  ASSESSMENT AND PLAN: 43 yo with history of type II diabetes and HTN presented with chest pain. 1. Chest pain: Pain radiates from epigastrium to throat.  It is pleuritic.  It is very atypical: present x 1 week continuously with no cardiac enzyme elevation and normal ECG.  D dimer normal.  I suspect that  the pain is GI-related, ?esophageal or peptic ulcer.  I think she probably needs an EGD.  In terms of cardiac workup, agree with echo but do not think that functional study is necessary as long as echo looks normal.   2. HTN: BP controlled on current regimen.  3. Diabetes: Given type II diabetes, would consider starting a statin.   Larey Dresser 02/27/2014 8:28 AM

## 2014-02-27 NOTE — Progress Notes (Signed)
2D Echocardiogram Complete.  02/27/2014   [name] [place], [place]

## 2014-02-27 NOTE — Progress Notes (Signed)
Inpatient Diabetes Program Recommendations  AACE/ADA: New Consensus Statement on Inpatient Glycemic Control (2013)  Target Ranges:  Prepandial:   less than 140 mg/dL      Peak postprandial:   less than 180 mg/dL (1-2 hours)      Critically ill patients:  140 - 180 mg/dL     Results for DEEANN, SERVIDIO (MRN [id number]) as of 02/27/2014 15:28  Ref. Range 02/27/2014 07:25 02/27/2014 11:40  Glucose-Capillary Latest Range: 70-99 mg/dL 159 (H) 224 (H)     Admitted with CP.  History of DM, HTN.  Home DM Meds:   Invokana 5 mg daily Metformin 1000 mg bid 70/30 insulin- 50 units bidwc   **Note patient was started on Lantus 10 units QHS (to start 1st dose tonight) and patient also started on Novolog Moderate SSI at 8am today.  **Given the fact that patient takes a fairly large dose of 70/30 insulin at home, she will likely need more basal insulin to cover her needs.  However, would like to assess how patient responds to Lantus and re-evaluate her CBGs in the AM   MD- Please change patient's diet to Carbohydrate Modified diet (currently ordered as Regular diet)   Will follow up patient tomorrow AM and assess her insulin requirements again   Wyn Quaker RN, MSN, CDE Diabetes Coordinator Inpatient Diabetes Program Team Pager: [phone] (8a-10p)

## 2014-02-27 NOTE — Progress Notes (Signed)
UR completed 

## 2014-02-27 NOTE — Consult Note (Signed)
Unassigned patient Reason for Consult:Chest pain/RUQ pain/GERD. Referring Physician: [facility]-Dr. Aletta Edouard [name] is an 43 y.o. female.  HPI: 43 year old, morbidly obese black female with multiple medical problems listed below, presented to the hospital with a 3 day history of chest pain and RUQ pain along with nausea and vomiting, worse postprandially and worse with greasy foods. Cardiac workup has been unrevealing. She had some diarrhea that has since resolved. She has severe reflux as well but that is somewhat better since admission.   Past Medical History  Diagnosis Date  . DM (diabetes mellitus)   . Hypertension   . Hypokalemia   . Asthma    Past Surgical History  Procedure Laterality Date  . Cesarean section      x 3  . Appendectomy    . Vesicovaginal fistula closure w/ tah  2009  . Hernia repair     Family History  Problem Relation Age of Onset  . Heart attack Mother   . Stroke Mother   . Diabetes Mother   . Hypertension Mother   . Arthritis Mother   . Stroke Father   . Hypertension Sister   . Diabetes Sister   . Seizures Brother   . Diabetes Brother    Social History:  reports that she quit smoking about 2 years ago. Her smoking use included Cigarettes. She has a 6.6 pack-year smoking history. She has never used smokeless tobacco. She reports that she drinks alcohol. She reports that she does not use illicit drugs.  Allergies: No Known Allergies  Medications: I have reviewed the patient's current medications.  Results for orders placed during the hospital encounter of 02/26/14 (from the past 48 hour(s))  LIPASE, BLOOD     Status: None   Collection Time    02/26/14  5:57 PM      Result Value Ref Range   Lipase 32  11 - 59 U/L  HEPATIC FUNCTION PANEL     Status: Abnormal   Collection Time    02/26/14  5:57 PM      Result Value Ref Range   Total Protein 6.9  6.0 - 8.3 g/dL   Albumin 3.3 (*) 3.5 - 5.2 g/dL   AST 15  0 - 37 U/L   ALT 18  0 - 35 U/L   Alkaline Phosphatase 100  39 - 117 U/L   Total Bilirubin <0.2 (*) 0.3 - 1.2 mg/dL   Bilirubin, Direct <0.2  0.0 - 0.3 mg/dL   Indirect Bilirubin NOT CALCULATED  0.3 - 0.9 mg/dL  CBC     Status: None   Collection Time    02/26/14  6:10 PM      Result Value Ref Range   WBC 8.2  4.0 - 10.5 K/uL   RBC 4.17  3.87 - 5.11 MIL/uL   Hemoglobin 12.2  12.0 - 15.0 g/dL   HCT 36.3  36.0 - 46.0 %   MCV 87.1  78.0 - 100.0 fL   MCH 29.3  26.0 - 34.0 pg   MCHC 33.6  30.0 - 36.0 g/dL   RDW 13.9  11.5 - 15.5 %   Platelets 317  150 - 400 K/uL  BASIC METABOLIC PANEL     Status: Abnormal   Collection Time    02/26/14  6:10 PM      Result Value Ref Range   Sodium 141  137 - 147 mEq/L   Potassium 3.7  3.7 - 5.3 mEq/L   Chloride 103  96 - 112 mEq/L   CO2 23  19 - 32 mEq/L   Glucose, Bld 185 (*) 70 - 99 mg/dL   BUN 14  6 - 23 mg/dL   Creatinine, Ser 0.85  0.50 - 1.10 mg/dL   Calcium 9.0  8.4 - 10.5 mg/dL   GFR calc non Af Amer 82 (*) >90 mL/min   GFR calc Af Amer >90  >90 mL/min   Comment: (NOTE)     The eGFR has been calculated using the CKD EPI equation.     This calculation has not been validated in all clinical situations.     eGFR's persistently <90 mL/min signify possible Chronic Kidney     Disease.  PRO B NATRIURETIC PEPTIDE     Status: None   Collection Time    02/26/14  6:10 PM      Result Value Ref Range   Pro B Natriuretic peptide (BNP) 5.7  0 - 125 pg/mL  I-STAT TROPOININ, ED     Status: None   Collection Time    02/26/14  6:31 PM      Result Value Ref Range   Troponin i, poc 0.00  0.00 - 0.08 ng/mL   Comment 3            Comment: Due to the release kinetics of cTnI,     a negative result within the first hours     of the onset of symptoms does not rule out     myocardial infarction with certainty.     If myocardial infarction is still suspected,     repeat the test at appropriate intervals.  D-DIMER, QUANTITATIVE     Status: None   Collection Time    02/26/14  7:43 PM       Result Value Ref Range   D-Dimer, Quant 0.36  0.00 - 0.48 ug/mL-FEU   Comment:            AT THE INHOUSE ESTABLISHED CUTOFF     VALUE OF 0.48 ug/mL FEU,     THIS ASSAY HAS BEEN DOCUMENTED     IN THE LITERATURE TO HAVE     A SENSITIVITY AND NEGATIVE     PREDICTIVE VALUE OF AT LEAST     98 TO 99%.  THE TEST RESULT     SHOULD BE CORRELATED WITH     AN ASSESSMENT OF THE CLINICAL     PROBABILITY OF DVT / VTE.  GLUCOSE, CAPILLARY     Status: Abnormal   Collection Time    02/26/14 10:25 PM      Result Value Ref Range   Glucose-Capillary 125 (*) 70 - 99 mg/dL  TROPONIN I     Status: None   Collection Time    02/26/14 11:25 PM      Result Value Ref Range   Troponin I <0.30  <0.30 ng/mL   Comment:            Due to the release kinetics of cTnI,     a negative result within the first hours     of the onset of symptoms does not rule out     myocardial infarction with certainty.     If myocardial infarction is still suspected,     repeat the test at appropriate intervals.  TROPONIN I     Status: None   Collection Time    02/27/14  3:35 AM      Result Value Ref Range   Troponin I <  0.30  <0.30 ng/mL   Comment:            Due to the release kinetics of cTnI,     a negative result within the first hours     of the onset of symptoms does not rule out     myocardial infarction with certainty.     If myocardial infarction is still suspected,     repeat the test at appropriate intervals.  GLUCOSE, CAPILLARY     Status: Abnormal   Collection Time    02/27/14  7:25 AM      Result Value Ref Range   Glucose-Capillary 159 (*) 70 - 99 mg/dL  TROPONIN I     Status: None   Collection Time    02/27/14  9:30 AM      Result Value Ref Range   Troponin I <0.30  <0.30 ng/mL   Comment:            Due to the release kinetics of cTnI,     a negative result within the first hours     of the onset of symptoms does not rule out     myocardial infarction with certainty.     If myocardial infarction is  still suspected,     repeat the test at appropriate intervals.  GLUCOSE, CAPILLARY     Status: Abnormal   Collection Time    02/27/14 11:40 AM      Result Value Ref Range   Glucose-Capillary 224 (*) 70 - 99 mg/dL  GLUCOSE, CAPILLARY     Status: Abnormal   Collection Time    02/27/14  4:46 PM      Result Value Ref Range   Glucose-Capillary 180 (*) 70 - 99 mg/dL   Dg Chest 2 View  02/26/2014   CLINICAL DATA:  Chest pain.  Shortness of breath.  Asthma.  EXAM: CHEST  2 VIEW  COMPARISON:  11/10/13  FINDINGS: The heart size and mediastinal contours are within normal limits. Both lungs are clear. The visualized skeletal structures are unremarkable.  IMPRESSION: No active cardiopulmonary disease.   Electronically Signed   By: Earle Gell M.D.   On: 02/26/2014 19:06   Review of Systems  Constitutional: Negative for fever, chills, weight loss, malaise/fatigue and diaphoresis.  HENT: Negative.   Eyes: Negative.   Respiratory: Negative.   Cardiovascular: Positive for chest pain. Negative for palpitations, orthopnea, claudication, leg swelling and PND.  Gastrointestinal: Positive for heartburn, nausea, vomiting, abdominal pain and diarrhea. Negative for constipation, blood in stool and melena.  Genitourinary: Negative.   Musculoskeletal: Negative.   Skin: Negative.   Neurological: Negative.  Negative for weakness.  Psychiatric/Behavioral: Negative.    Blood pressure 127/63, pulse 64, temperature 97.8 F (36.6 C), temperature source Oral, resp. rate 18, height 5' 1"  (1.549 m), weight 108.863 kg (240 lb), SpO2 98.00%. Physical Exam  Constitutional: She is oriented to person, place, and time. She appears well-developed and well-nourished.  HENT:  Head: Normocephalic and atraumatic.  Eyes: Conjunctivae and EOM are normal. Pupils are equal, round, and reactive to light.  Neck: Normal range of motion. Neck supple.  Cardiovascular: Normal rate and regular rhythm.   Respiratory: Effort normal and  breath sounds normal.  GI: Soft. Bowel sounds are normal. She exhibits no mass. There is tenderness. There is guarding. There is no rebound.  Musculoskeletal: Normal range of motion.  Neurological: She is alert and oriented to person, place, and time.  Skin: Skin is warm and  dry.  Psychiatric: She has a normal mood and affect. Her behavior is normal. Judgment and thought content normal.   Assessment/Plan: 1) Chest pain/RUQ pain with nausea and vomiting worse postprandially; rule out gallbladder disease. Schedule abdominal ultrasound in AM. Low fat diet for now. If the ultrasound is normal, proceed with a HIDA scan with CCK injection. 2) GERD; Continue PPI's.  3) Diarrhea that seems to have resolved.  4) Uncontrolled DM.  5) Obstructive sleep apnea/allergic rhinitis/asthma.  6) Morbid obesity.  [name] 02/27/2014, 5:26 PM

## 2014-02-27 NOTE — Progress Notes (Addendum)
Patient ID: [name], female   DOB: 1971/01/31, 43 y.o.   MRN: [id number] [facility] PROGRESS NOTE  SAMAIA IWATA [id number]:[id number] DOB: [birth year] DOA: 02/26/2014 PCP: Angelica Chessman, MD  Brief narrative: 43 y.o. female with past medical history of hypertension, obesity, diabetes with diabetic neuropathy who presented to [facility] ED 02/26/2014 with several day history of continuous substernal chest pain, 10/10 in intensity not relieved with NSAID's but did report relieved with lying on her left side. She also reported having diarrhea for past 24 hours. She has received 3 nitroglycerin in ED without relief. Morphine brought her pain down to a 7/10. EKG and troponin were normal on admission. Chest x-ray negative. White blood cell count normal. Liver function tests and lipase normal.  Assessment/Plan:  Principal Problem:   Atypical chest pain - less likely ACS; cardiac enzymes are WNL - has been seen by cardio who also agrees cardio source less likely - she can continue aspirin daily for now - GI for further evaluation (Dr. Collene Mares consulted) Active Problems:   Abdominal pain, nausea, diarrhea - unclear etiology - did not have diarrhea this am - GI consulted - on protonix but does not seem to have much relief   Obesity - nutrition consulted    Essential hypertension, benign - continue Norvasc, benazepril. coreg   DM type 2 (diabetes mellitus, type 2) with complications of diabetic neuropathy  - check A1c - continue SSI and Levemir 10 units at bedtime - neurontin for neuropathy  - appreciate DM coordinator input   Stable asthma - continue dulera   DVT prophylaxis: Lovenox sub Q  Code Status: full code  Family Communication: plan of care discussed with the patient at the bedside Disposition Plan: home when stable   Robbie Lis, MD  [facility] Pager [phone]  If 7PM-7AM, please contact night-coverage [email or web address] Password [email or web address] 02/27/2014, 2:26 PM   LOS: 1 day    Consultants:  GI (Dr. Collene Mares)  Cardiology   Procedures:  None   Antibiotics:  None   HPI/Subjective: No acute overnight events.  Objective: Filed Vitals:   02/26/14 2130 02/26/14 2145 02/26/14 2217 02/27/14 0500  BP: 119/74 118/63 130/67 127/63  Pulse: 73 83 68 64  Temp:   98.1 F (36.7 C) 97.8 F (36.6 C)  TempSrc:   Oral   Resp: 15 16 18 18   Height:   5\' 1"  (1.549 m)   Weight:   108.863 kg (240 lb)   SpO2: 98% 95% 98% 98%   No intake or output data in the 24 hours ending 02/27/14 1426  Exam:   General:  Pt is alert, follows commands appropriately, not in acute distress  Cardiovascular: Regular rate and rhythm, S1/S2, no murmurs  Respiratory: Clear to auscultation bilaterally, no wheezing, no crackles, no rhonchi  Abdomen: obese, tender in epigastric area, non distended, bowel sounds present  Extremities: No edema, pulses DP and PT palpable bilaterally  Neuro: Grossly nonfocal  Data Reviewed: Basic Metabolic Panel:  Recent Labs Lab 02/26/14 1810  NA 141  K 3.7  CL 103  CO2 23  GLUCOSE 185*  BUN 14  CREATININE 0.85  CALCIUM 9.0   Liver Function Tests:  Recent Labs Lab 02/26/14 1757  AST 15  ALT 18  ALKPHOS 100  BILITOT <0.2*  PROT 6.9  ALBUMIN 3.3*    Recent Labs Lab 02/26/14 1757  LIPASE 32   No results found for this basename: AMMONIA,  in the last 168 hours CBC:  Recent Labs  Lab 02/26/14 1810  WBC 8.2  HGB 12.2  HCT 36.3  MCV 87.1  PLT 317   Cardiac Enzymes:  Recent Labs Lab 02/26/14 2325 02/27/14 0335 02/27/14 0930  TROPONINI <0.30 <0.30 <0.30   BNP: No components found with this basename: POCBNP,  CBG:  Recent Labs Lab 02/26/14 2225 02/27/14 0725 02/27/14 1140  GLUCAP 125* 159* 224*    No results found for this or any previous visit (from the past 240 hour(s)).   Studies: Dg Chest 2 View 02/26/2014   IMPRESSION: No active cardiopulmonary disease.   Electronically Signed   By: Earle Gell M.D.    On: 02/26/2014 19:06    Scheduled Meds: . amLODipine  10 mg Oral Daily  . aspirin EC  81 mg Oral Daily  . benazepril  20 mg Oral Daily  . carvedilol  3.125 mg Oral BID WC  . enoxaparin (LOVENOX) i  40 mg Subcutaneous Q24H  . gabapentin  400 mg Oral TID  . insulin aspart  0-15 Units Subcutaneous TID WC  . insulin aspart  0-5 Units Subcutaneous QHS  . insulin detemir  10 Units Subcutaneous QHS  . mometasone-formoterol  2 puff Inhalation BID  . pantoprazole   40 mg Intravenous Q12H   Continuous Infusions: . 0.9 % NaCl with KCl 20 mEq / L 50 mL/hr at 02/26/14 2302

## 2014-02-28 ENCOUNTER — Observation Stay (HOSPITAL_COMMUNITY): Payer: Self-pay

## 2014-02-28 ENCOUNTER — Encounter (HOSPITAL_COMMUNITY): Payer: Self-pay | Admitting: General Practice

## 2014-02-28 DIAGNOSIS — K7689 Other specified diseases of liver: Secondary | ICD-10-CM

## 2014-02-28 DIAGNOSIS — E1310 Other specified diabetes mellitus with ketoacidosis without coma: Secondary | ICD-10-CM

## 2014-02-28 DIAGNOSIS — M549 Dorsalgia, unspecified: Secondary | ICD-10-CM

## 2014-02-28 DIAGNOSIS — E119 Type 2 diabetes mellitus without complications: Secondary | ICD-10-CM

## 2014-02-28 LAB — GLUCOSE, CAPILLARY
GLUCOSE-CAPILLARY: 143 mg/dL — AB (ref 70–99)
GLUCOSE-CAPILLARY: 187 mg/dL — AB (ref 70–99)
Glucose-Capillary: 269 mg/dL — ABNORMAL HIGH (ref 70–99)
Glucose-Capillary: 181 mg/dL — ABNORMAL HIGH (ref 70–99)
Glucose-Capillary: 227 mg/dL — ABNORMAL HIGH (ref 70–99)

## 2014-02-28 MED ORDER — SINCALIDE 5 MCG IJ SOLR
INTRAMUSCULAR | Status: AC
  Administered 2014-02-28: 2.17 ug via INTRAVENOUS
  Filled 2014-02-28: qty 10

## 2014-02-28 MED ORDER — SINCALIDE 5 MCG IJ SOLR
0.0200 ug/kg | Freq: Once | INTRAMUSCULAR | Status: AC
  Administered 2014-02-28: 2.17 ug via INTRAVENOUS

## 2014-02-28 MED ORDER — STERILE WATER FOR INJECTION IJ SOLN
INTRAMUSCULAR | Status: AC
Start: 2014-02-28 — End: 2014-03-01
  Filled 2014-02-28: qty 10

## 2014-02-28 MED ORDER — TECHNETIUM TC 99M MEBROFENIN IV KIT
5.0000 | PACK | Freq: Once | INTRAVENOUS | Status: AC | PRN
  Administered 2014-02-28: 5 via INTRAVENOUS

## 2014-02-28 MED ORDER — METFORMIN HCL 500 MG PO TABS
1000.0000 mg | ORAL_TABLET | Freq: Two times a day (BID) | ORAL | Status: DC
  Filled 2014-02-28 (×6): qty 2

## 2014-02-28 NOTE — Care Management Note (Signed)
    Page 1 of 1   02/28/2014     11:57:02 AM CARE MANAGEMENT NOTE 02/28/2014  Patient:  [name], [name]   Account Number:  [id number]  Date Initiated:  02/28/2014  Documentation initiated by:  GRAVES-BIGELOW,Layton Naves  Subjective/Objective Assessment:   Pt admitted for cp. PCP Dr. Doreene Burke at the [facility]. Pt gets her medications from clinic pharmacy. Pt's next appointment is June 10th 2015 at 4:30 @ the clinic. RN aware and she is to call MD to see when possible d/c.     Action/Plan:   No needs from CM at this time.   Anticipated DC Date:  03/01/2014   Anticipated DC Plan:  [place]  CM consult  Follow-up appt scheduled      Choice offered to / List presented to:             Status of service:  Completed, signed off Medicare Important Message given?  NO (If response is "NO", the following Medicare IM given date fields will be blank) Date Medicare IM given:   Date Additional Medicare IM given:    Discharge Disposition:  HOME/SELF CARE  Per UR Regulation:  Reviewed for med. necessity/level of care/duration of stay  If discussed at [place] of Stay Meetings, dates discussed:    Comments:

## 2014-02-28 NOTE — Progress Notes (Addendum)
Inpatient Diabetes Program Recommendations  AACE/ADA: New Consensus Statement on Inpatient Glycemic Control (2013)  Target Ranges:  Prepandial:   less than 140 mg/dL      Peak postprandial:   less than 180 mg/dL (1-2 hours)      Critically ill patients:  140 - 180 mg/dL     Results for [name], [name] (MRN [id number]) as of 02/28/2014 12:35  Ref. Range 02/27/2014 07:25 02/27/2014 11:40 02/27/2014 16:46 02/27/2014 21:53  Glucose-Capillary Latest Range: 70-99 mg/dL 159 (H) 224 (H) 180 (H) 269 (H)    Results for [name], [name] (MRN [id number]) as of 02/28/2014 12:35  Ref. Range 02/28/2014 07:38 02/28/2014 11:34  Glucose-Capillary Latest Range: 70-99 mg/dL 181 (H) 143 (H)    Results for [name], [name] (MRN [id number]) as of 02/28/2014 12:35  Ref. Range 02/27/2014 15:00  Hemoglobin A1C Latest Range: <5.7 % 9.4 (H)    Admitted with CP. History of DM, HTN.    Home DM Meds:  Invokana 5 mg daily  Metformin 1000 mg bid  70/30 insulin- 50 units bidwc    **Spoke with patient about her elevated A1c of 9.4%.  Explained what an A1c is and what it measures.  Reminded patient that her goal A1c is 7% or less per ADA standards to prevent both acute and long-term complications.  Encouraged patient to check her CBGs at least bid at home (fasting and another check within the day) and to record all CBGs in a logbook for her PCP to review (PCP is at the [facility]).  **Patient told me that she does not take her insulin as regularly as she should.  Patient stated to me that she usually won't take her morning dose of 70/30 insulin if her AM CBG is around 100 mg/dl stating that she gets scared she will "have a low".  Explained to patient that a CBG of 100 mg/dl is actually a good reading and that to maintain CBGs WNL that she needs to take her insulin to prevent hyperglycemia.  Explained to patient that if she has hypoglycemia with her dose of 50 units, that she can reduce her dose  slightly if needed.  Encouraged patient to follow up with her PCP for insulin adjustments if she has frequent labile CBGs.  **Patient also told me she tends to drink a lot of sweetened beverages at home [facility]).  Encouraged patient to try to eliminate regular sodas from her diet.     MD- Based on patient's current CBG readings, recommend the following:  1. Increase Levemir slightly to 13 units QHS 2. Once patient is eating and back on PO diet, may want to add low dose meal coverage- Novolog 3 units tid with meals 3. Please make sure to d/c patient home on her 70/30 insulin as patient does not have insurance and 70/30 insulin is the most affordable option out of pocket   Will follow Wyn Quaker RN, MSN, CDE Diabetes Coordinator Inpatient Diabetes Program Team Pager: [phone] (8a-10p)

## 2014-02-28 NOTE — Progress Notes (Signed)
Patient ID: KAMEE BOBST, female   DOB: 12-17-1970, 43 y.o.   MRN: [id number] [facility] PROGRESS NOTE  SONAM WANDEL [id number]:[id number] DOB: 11/19/1970 DOA: 02/26/2014 PCP: Angelica Chessman, MD  Brief narrative: 43 y.o. female with past medical history of hypertension, obesity, diabetes with diabetic neuropathy who presented to [facility] ED 02/26/2014 with several day history of continuous substernal chest pain, 10/10 in intensity not relieved with NSAID's but did report relieved with lying on her left side. She also reported having diarrhea for past 24 hours PTA. She has received 3 nitroglycerin in ED without relief. Morphine brought her pain down to a 7/10. EKG and troponin were normal on admission. Chest x-ray negative. White blood cell count normal. Liver function tests and lipase normal.   Assessment/Plan:   Principal Problem:  Atypical chest pain  - less likely ACS; cardiac enzymes are WNL and 12 lead EKG showed no acute changes - per cardio, further studies are not required at this time and recommendation was to proceed with GI eval - continue aspirin daily   Active Problems:  Abdominal pain, nausea, diarrhea  - unclear etiology; abdominal US showed hepatic steatosis - per GI, we will continue further eval with HIDA scan and likely CCK injection - appreciate GI following - continue protonix 40 mg IV Q 12 hours Obesity  - nutrition consulted  Essential hypertension, benign  - continue Norvasc, benazepril. coreg  DM type 2 (diabetes mellitus, type 2) with complications of diabetic neuropathy  - A1c on this admission is 9.4 - CBG's in past 24 hours: 269, 181, 143 - appreciate DM coordinator input - restart metformin which is pt home med; she is also on large dose of novolog 70/30 50 units BID and currently only on Lantus 10 units; will see with DM coordinator about appropriate dosages. - neurontin for neuropathy  - appreciate DM coordinator input  Stable asthma  - continue  dulera   DVT prophylaxis: Lovenox sub Q while in patient   Code Status: full code  Family Communication: plan of care discussed with the patient at the bedside  Disposition Plan: home when stable   Consultants:  GI (Dr. Collene Mares)  Cardiology (Dr. Claiborne Billings) Procedures/Studies:  Abdominal US 02/28/2014  Antibiotics:  None    Robbie Lis, MD  [facility] Pager [phone]  If 7PM-7AM, please contact night-coverage [email or web address] Password [email or web address] 02/28/2014, 11:40 AM   LOS: 2 days    HPI/Subjective: No acute overnight events.  Objective: Filed Vitals:   02/27/14 0500 02/27/14 2017 02/27/14 2209 02/28/14 0600  BP: 127/63  124/77 127/69  Pulse: 64  78 68  Temp: 97.8 F (36.6 C)  97.9 F (36.6 C) 98.6 F (37 C)  TempSrc:   Oral Oral  Resp: 18  16 20   Height:      Weight:    108.698 kg (239 lb 10.2 oz)  SpO2: 98% 97% 98% 98%   No intake or output data in the 24 hours ending 02/28/14 1140  Exam:   General:  Pt is not in distress  Cardiovascular: Regular rate and rhythm, S1/S2, no murmurs  Respiratory: Clear to auscultation bilaterally, no wheezing, no crackles, no rhonchi  Abdomen: tender in epigastric and RUQ abdominal area, no rebound or guarding, bowel sounds present  Extremities: No edema, pulses DP and PT palpable bilaterally  Neuro: Grossly nonfocal  Data Reviewed: Basic Metabolic Panel:  Recent Labs Lab 02/26/14 1810  NA 141  K 3.7  CL 103  CO2 23  GLUCOSE  185*  BUN 14  CREATININE 0.85  CALCIUM 9.0   Liver Function Tests:  Recent Labs Lab 02/26/14 1757  AST 15  ALT 18  ALKPHOS 100  BILITOT <0.2*  PROT 6.9  ALBUMIN 3.3*    Recent Labs Lab 02/26/14 1757  LIPASE 32   No results found for this basename: AMMONIA,  in the last 168 hours CBC:  Recent Labs Lab 02/26/14 1810  WBC 8.2  HGB 12.2  HCT 36.3  MCV 87.1  PLT 317   Cardiac Enzymes:  Recent Labs Lab 02/26/14 2325 02/27/14 0335 02/27/14 0930  TROPONINI <0.30 <0.30  <0.30   BNP: No components found with this basename: POCBNP,  CBG:  Recent Labs Lab 02/27/14 1140 02/27/14 1646 02/27/14 2153 02/28/14 0738 02/28/14 1134  GLUCAP 224* 180* 269* 181* 143*    No results found for this or any previous visit (from the past 240 hour(s)).   Studies: Dg Chest 2 View 02/26/2014    IMPRESSION: No active cardiopulmonary disease.     US Abdomen Complete 02/28/2014    IMPRESSION: There is again noted increased liver echogenicity consistent with hepatic steatosis. While no focal liver lesions are identified, it must be cautioned that the sensitivity of ultrasound for focal liver lesions is diminished in this circumstance. Study otherwise unremarkable.      Scheduled Meds: . amLODipine  10 mg Oral Daily  . aspirin EC  81 mg Oral Daily  . benazepril  20 mg Oral Daily  . carvedilol  3.125 mg Oral BID WC  . enoxaparin (LOVENOX)   40 mg Subcutaneous Q24H  . gabapentin  400 mg Oral TID  . insulin aspart  0-15 Units Subcutaneous TID WC  . insulin aspart  0-5 Units Subcutaneous QHS  . insulin detemir  10 Units Subcutaneous QHS  . mometasone-formoterol  2 puff Inhalation BID  . pantoprazole   40 mg Intravenous Q12H

## 2014-02-28 NOTE — Progress Notes (Signed)
Patient Name: [name] Date of Encounter: 02/28/2014     Principal Problem:   Atypical chest pain Active Problems:   Obesity   Essential hypertension, benign   DM type 2 (diabetes mellitus, type 2)   Stable asthma   Vomiting and diarrhea    SUBJECTIVE  Appears to be uncomfortable. Patient states she still have constant substernal pressure radiate to R shoulder. Worsen when take deep breath or press on epigastric area. Denies any significant SOB today, unlike yesterday. However continue to have nausea and vomiting.   CURRENT MEDS . amLODipine  10 mg Oral Daily  . aspirin EC  81 mg Oral Daily  . benazepril  20 mg Oral Daily  . carvedilol  3.125 mg Oral BID WC  . enoxaparin (LOVENOX) injection  40 mg Subcutaneous Q24H  . gabapentin  400 mg Oral TID  . insulin aspart  0-15 Units Subcutaneous TID WC  . insulin aspart  0-5 Units Subcutaneous QHS  . insulin detemir  10 Units Subcutaneous QHS  . mometasone-formoterol  2 puff Inhalation BID  . pantoprazole (PROTONIX) IV  40 mg Intravenous Q12H    OBJECTIVE  Filed Vitals:   02/27/14 0500 02/27/14 2017 02/27/14 2209 02/28/14 0600  BP: 127/63  124/77 127/69  Pulse: 64  78 68  Temp: 97.8 F (36.6 C)  97.9 F (36.6 C) 98.6 F (37 C)  TempSrc:   Oral Oral  Resp: 18  16 20   Height:      Weight:    239 lb 10.2 oz (108.698 kg)  SpO2: 98% 97% 98% 98%   No intake or output data in the 24 hours ending 02/28/14 0928 Filed Weights   02/26/14 2217 02/28/14 0600  Weight: 240 lb (108.863 kg) 239 lb 10.2 oz (108.698 kg)    PHYSICAL EXAM  General: Pleasant, NAD. Neuro: Alert and oriented X 3. Moves all extremities spontaneously. Psych: Normal affect. HEENT:  Normal  Neck: Supple without bruits or JVD. Lungs:  Resp regular and unlabored, CTA. Heart: RRR no s3, s4, or murmurs. Abdomen: Soft, non-tender, non-distended, BS + x 4.  Extremities: No clubbing, cyanosis or edema. DP/PT/Radials 2+ and equal  bilaterally.  Accessory Clinical Findings  CBC  Recent Labs  02/26/14 1810  WBC 8.2  HGB 12.2  HCT 36.3  MCV 87.1  PLT 093   Basic Metabolic Panel  Recent Labs  02/26/14 1810  NA 141  K 3.7  CL 103  CO2 23  GLUCOSE 185*  BUN 14  CREATININE 0.85  CALCIUM 9.0   Liver Function Tests  Recent Labs  02/26/14 1757  AST 15  ALT 18  ALKPHOS 100  BILITOT <0.2*  PROT 6.9  ALBUMIN 3.3*    Recent Labs  02/26/14 1757  LIPASE 32   Cardiac Enzymes  Recent Labs  02/26/14 2325 02/27/14 0335 02/27/14 0930  TROPONINI <0.30 <0.30 <0.30   BNP No components found with this basename: POCBNP,  D-Dimer  Recent Labs  02/26/14 1943  DDIMER 0.36   Hemoglobin A1C  Recent Labs  02/27/14 1500  HGBA1C 9.4*   Fasting Lipid Panel No results found for this basename: CHOL, HDL, LDLCALC, TRIG, CHOLHDL, LDLDIRECT,  in the last 72 hours Thyroid Function Tests No results found for this basename: TSH, T4TOTAL, FREET3, T3FREE, THYROIDAB,  in the last 72 hours  TELE  NSR with HR 70s-80s, occasional HR increase to 120s. No significant ventricular ectopy.   ECG  6/7 Sinus tach with HR 90, no  significant ST-T wave changes  Radiology/Studies  Dg Chest 2 View  02/26/2014   CLINICAL DATA:  Chest pain.  Shortness of breath.  Asthma.  EXAM: CHEST  2 VIEW  COMPARISON:  11/10/13  FINDINGS: The heart size and mediastinal contours are within normal limits. Both lungs are clear. The visualized skeletal structures are unremarkable.  IMPRESSION: No active cardiopulmonary disease.   Electronically Signed   By: Earle Gell M.D.   On: 02/26/2014 19:06   US Abdomen Complete  02/28/2014   CLINICAL DATA:  Pain with nausea and vomiting  EXAM: ULTRASOUND ABDOMEN COMPLETE  COMPARISON:  December 10, 2010  FINDINGS: Gallbladder:  No gallstones or wall thickening visualized. There is no pericholecystic fluid. No sonographic Murphy sign noted.  Common bile duct:  Diameter: 2 mm. There is no  intrahepatic, common hepatic, or common bile duct dilatation.  Liver:  No focal lesion identified. Liver echogenicity is diffusely increased.  IVC:  No abnormality visualized.  Pancreas:  No mass or inflammatory focus.  Spleen:  Size and appearance within normal limits.  Right Kidney:  Length: 11.1 cm. Echogenicity within normal limits. No mass or hydronephrosis visualized.  Left Kidney:  Length: 11.0 cm. Echogenicity within normal limits. No mass or hydronephrosis visualized.  Abdominal aorta:  No aneurysm visualized.  Other findings:  No demonstrable ascites.  IMPRESSION: There is again noted increased liver echogenicity consistent with hepatic steatosis. While no focal liver lesions are identified, it must be cautioned that the sensitivity of ultrasound for focal liver lesions is diminished in this circumstance. Study otherwise unremarkable.   Electronically Signed   By: Lowella Grip M.D.   On: 02/28/2014 08:15    ASSESSMENT AND PLAN  1. Atypical chest pain  - continuous for 1 wk without EKG change or elevated enzyme, d-dimer negative. Worse with deep inspiration and epigastric pressure  - GI consulted, abdominal U/S shows hepatic steatosis, however no significant gallbladder dx (hepatic steatosis noted on previous CT of abd and pelvis in Feb 2015)  - Echo EF 29%, grade 2 diastolic dysfunction, no significant valvular dx, no regional wall motion abnormality  - per Dr. Claris Gladden note, functional study likely unnecessary given normal echo   - further workup per GI team 2. HTN 3. DM, obtain lipid panel  Signed, Almyra Deforest PA Pager: [phone]   Patient seen and examined. Agree with assessment and plan. Suspect noncardiac discomfort, tenderness epigastric and RUQ. Significant hepatic steatosis. DM not well controlled with HbA1c 9.4. Nl LV Fxn with G 2 diastolic dysfunction  Probably contributed by HTN. GI evaluation today.  Discussed weight loss and improved diabetes control.   Troy Sine,  MD, [facility] 02/28/2014 10:13 AM

## 2014-02-28 NOTE — Progress Notes (Signed)
INITIAL NUTRITION ASSESSMENT  DOCUMENTATION CODES Per approved criteria  -Morbid Obesity   INTERVENTION:  Diet advancement as able per MD post procedure.  OP diabetes/wt loss education after discharge.  NUTRITION DIAGNOSIS: No nutrition diagnosis at this time.  Goal: Intake to meet >90% of estimated nutrition needs.  Monitor:  Diet advancement, PO intake, labs, weight trend.  Reason for Assessment: MD Consult regarding obesity  43 y.o. female  Admitting Dx: Atypical chest pain  ASSESSMENT: 43 year old, morbidly obese black female with multiple medical problems listed below, presented to the hospital with a 3 day history of chest pain and RUQ pain along with nausea and vomiting, worse postprandially and worse with greasy foods. Cardiac workup has been unrevealing. She had some diarrhea that has since resolved. She has severe reflux as well but that is somewhat better since admission.   Patient is not appropriate for weight loss education at this time. Currently out of room for a procedure. Diet education would be more appropriate as an outpatient. Patient also has type 2 diabetes. Will order OP diabetes/wt loss education. Patient is currently NPO.   Height: Ht Readings from Last 1 Encounters:  02/26/14 5\' 1"  (1.549 m)    Weight: Wt Readings from Last 1 Encounters:  02/28/14 239 lb 10.2 oz (108.698 kg)   Ideal Body Weight: 47.7 kg  % Ideal Body Weight: 228%  Wt Readings from Last 10 Encounters:  02/28/14 239 lb 10.2 oz (108.698 kg)  12/08/13 243 lb (110.224 kg)  12/01/13 242 lb (109.77 kg)  11/10/13 242 lb (109.77 kg)  11/10/13 244 lb (110.678 kg)  09/05/13 243 lb (110.224 kg)  08/03/13 239 lb (108.41 kg)  05/19/13 243 lb (110.224 kg)  04/27/13 233 lb 11 oz (106 kg)  08/11/12 249 lb 9.6 oz (113.218 kg)    Usual Body Weight: 233-249 lb  % Usual Body Weight: 100%  BMI:  Body mass index is 45.3 kg/(m^2).  class 3, extreme/morbid obesity  Estimated  Nutritional Needs: Kcal: 1800-2000 Protein: 95-110 gm Fluid: 2 L  Skin: WDL  Diet Order: NPO  EDUCATION NEEDS: -No education needs identified at this time  No intake or output data in the 24 hours ending 02/28/14 0839  Last BM: None documented since admission   Labs:   Recent Labs Lab 02/26/14 1810  NA 141  K 3.7  CL 103  CO2 23  BUN 14  CREATININE 0.85  CALCIUM 9.0  GLUCOSE 185*    CBG (last 3)   Recent Labs  02/27/14 1646 02/27/14 2153 02/28/14 0738  GLUCAP 180* 269* 181*    Scheduled Meds: . amLODipine  10 mg Oral Daily  . aspirin EC  81 mg Oral Daily  . benazepril  20 mg Oral Daily  . carvedilol  3.125 mg Oral BID WC  . enoxaparin (LOVENOX) injection  40 mg Subcutaneous Q24H  . gabapentin  400 mg Oral TID  . insulin aspart  0-15 Units Subcutaneous TID WC  . insulin aspart  0-5 Units Subcutaneous QHS  . insulin detemir  10 Units Subcutaneous QHS  . mometasone-formoterol  2 puff Inhalation BID  . pantoprazole (PROTONIX) IV  40 mg Intravenous Q12H    Continuous Infusions:   Past Medical History  Diagnosis Date  . Hypertension   . Hypokalemia   . Asthma   . DM (diabetes mellitus)     INSULIN DEPENDENT  . Depression   . GERD (gastroesophageal reflux disease)   . Headache(784.0)  Past Surgical History  Procedure Laterality Date  . Cesarean section      x 3  . Appendectomy    . Vesicovaginal fistula closure w/ tah  2009  . Hernia repair      Molli Barrows, RD, LDN, [place] Pager [phone] After Hours Pager [phone]

## 2014-03-01 ENCOUNTER — Ambulatory Visit: Payer: Self-pay | Admitting: Internal Medicine

## 2014-03-01 ENCOUNTER — Observation Stay (HOSPITAL_COMMUNITY): Payer: Self-pay

## 2014-03-01 DIAGNOSIS — K828 Other specified diseases of gallbladder: Secondary | ICD-10-CM

## 2014-03-01 LAB — APTT: aPTT: 25 seconds (ref 24–37)

## 2014-03-01 LAB — LIPID PANEL
CHOL/HDL RATIO: 3.6 ratio
Cholesterol: 170 mg/dL (ref 0–200)
HDL: 47 mg/dL (ref >39)
LDL Cholesterol: 87 mg/dL (ref 0–99)
Triglycerides: 182 mg/dL — ABNORMAL HIGH (ref <150)
VLDL: 36 mg/dL (ref 0–40)

## 2014-03-01 LAB — GLUCOSE, CAPILLARY
GLUCOSE-CAPILLARY: 165 mg/dL — AB (ref 70–99)
Glucose-Capillary: 173 mg/dL — ABNORMAL HIGH (ref 70–99)
Glucose-Capillary: 235 mg/dL — ABNORMAL HIGH (ref 70–99)
Glucose-Capillary: 184 mg/dL — ABNORMAL HIGH (ref 70–99)

## 2014-03-01 LAB — PROTIME-INR
INR: 1 (ref 0.00–1.49)
Prothrombin Time: 13 seconds (ref 11.6–15.2)

## 2014-03-01 MED ORDER — SORBITOL 70 % SOLN
30.0000 mL | Freq: Every day | Status: DC | PRN
  Administered 2014-03-01: 30 mL via ORAL
  Filled 2014-03-01: qty 30

## 2014-03-01 MED ORDER — PANTOPRAZOLE SODIUM 40 MG PO TBEC
40.0000 mg | DELAYED_RELEASE_TABLET | Freq: Two times a day (BID) | ORAL | Status: DC
  Administered 2014-03-01 – 2014-03-04 (×7): 40 mg via ORAL
  Filled 2014-03-01 (×7): qty 1

## 2014-03-01 MED ORDER — LUBIPROSTONE 24 MCG PO CAPS
24.0000 ug | ORAL_CAPSULE | Freq: Two times a day (BID) | ORAL | Status: DC
  Administered 2014-03-01 – 2014-03-03 (×4): 24 ug via ORAL
  Filled 2014-03-01 (×6): qty 1

## 2014-03-01 MED ORDER — SORBITOL 70 % SOLN
960.0000 mL | TOPICAL_OIL | Freq: Once | ORAL | Status: DC
  Filled 2014-03-01: qty 240

## 2014-03-01 MED ORDER — ONDANSETRON HCL 4 MG/2ML IJ SOLN
4.0000 mg | Freq: Four times a day (QID) | INTRAMUSCULAR | Status: DC | PRN
  Administered 2014-03-02: 4 mg via INTRAVENOUS
  Filled 2014-03-01 (×2): qty 2

## 2014-03-01 MED ORDER — BISACODYL 10 MG RE SUPP
10.0000 mg | Freq: Once | RECTAL | Status: DC
  Filled 2014-03-01: qty 1

## 2014-03-01 MED ORDER — KETOROLAC TROMETHAMINE 15 MG/ML IJ SOLN
15.0000 mg | Freq: Four times a day (QID) | INTRAMUSCULAR | Status: DC | PRN
  Administered 2014-03-03 – 2014-03-04 (×2): 15 mg via INTRAVENOUS
  Filled 2014-03-01 (×2): qty 1

## 2014-03-01 NOTE — Progress Notes (Signed)
Subjective: Woke up feeling fine then felt nausea and vomited.  Still with epigastric pain.    Objective: Vital signs in last 24 hours: Temp:  [98.4 F (36.9 C)-98.8 F (37.1 C)] 98.4 F (36.9 C) (06/10 0522) Pulse Rate:  [79-93] 82 (06/10 0853) Resp:  [18] 18 (06/10 0522) BP: (121-139)/(81-82) 125/82 mmHg (06/10 0853) SpO2:  [98 %-100 %] 100 % (06/10 0522) Weight:  [242 lb 6.4 oz (109.952 kg)] 242 lb 6.4 oz (109.952 kg) (06/10 0522)    Intake/Output from previous day:   Intake/Output this shift:    Medications Current Facility-Administered Medications  Medication Dose Route Frequency Provider Last Rate Last Dose  . acetaminophen (TYLENOL) tablet 650 mg  650 mg Oral Q6H PRN Delfina Redwood, MD       Or  . acetaminophen (TYLENOL) suppository 650 mg  650 mg Rectal Q6H PRN Delfina Redwood, MD      . albuterol (PROVENTIL) (2.5 MG/3ML) 0.083% nebulizer solution 2.5 mg  2.5 mg Nebulization Q6H PRN Delfina Redwood, MD      . amLODipine (NORVASC) tablet 10 mg  10 mg Oral Daily Delfina Redwood, MD   10 mg at 02/28/14 1017  . aspirin EC tablet 81 mg  81 mg Oral Daily Delfina Redwood, MD   81 mg at 02/28/14 1017  . benazepril (LOTENSIN) tablet 20 mg  20 mg Oral Daily Delfina Redwood, MD   20 mg at 02/28/14 1017  . carvedilol (COREG) tablet 3.125 mg  3.125 mg Oral BID WC Delfina Redwood, MD   3.125 mg at 03/01/14 0853  . cyclobenzaprine (FLEXERIL) tablet 5 mg  5 mg Oral TID PRN Delfina Redwood, MD      . enoxaparin (LOVENOX) injection 40 mg  40 mg Subcutaneous Q24H Delfina Redwood, MD   40 mg at 02/28/14 2138  . gabapentin (NEURONTIN) capsule 400 mg  400 mg Oral TID Delfina Redwood, MD   400 mg at 02/28/14 2138  . insulin aspart (novoLOG) injection 0-15 Units  0-15 Units Subcutaneous TID WC Delfina Redwood, MD   3 Units at 03/01/14 [phone]  . insulin aspart (novoLOG) injection 0-5 Units  0-5 Units Subcutaneous QHS Delfina Redwood, MD   2 Units at  02/28/14 2139  . insulin detemir (LEVEMIR) injection 10 Units  10 Units Subcutaneous QHS Delfina Redwood, MD   10 Units at 02/28/14 2139  . metFORMIN (GLUCOPHAGE) tablet 1,000 mg  1,000 mg Oral BID WC Robbie Lis, MD      . mometasone-formoterol [facility]) 100-5 MCG/ACT inhaler 2 puff  2 puff Inhalation BID Delfina Redwood, MD   2 puff at 03/01/14 0850  . morphine 2 MG/ML injection 2-4 mg  2-4 mg Intravenous Q3H PRN Delfina Redwood, MD   2 mg at 02/28/14 1739  . pantoprazole (PROTONIX) injection 40 mg  40 mg Intravenous Q12H Delfina Redwood, MD   40 mg at 02/28/14 2138  . traMADol (ULTRAM) tablet 50-100 mg  50-100 mg Oral Q6H PRN Delfina Redwood, MD   100 mg at 03/01/14 0022    PE: General appearance: alert, cooperative and no distress Lungs: clear to auscultation bilaterally Heart: regular rate and rhythm, S1, S2 normal, no murmur, click, rub or gallop Abdomen: +BS, tender in the epigastric area Extremities: No LEE Pulses: 2+ and symmetric Skin: Warm and dry Neurologic: Grossly normal  Lab Results:   Recent Labs  02/26/14 1810  WBC  8.2  HGB 12.2  HCT 36.3  PLT 317   BMET  Recent Labs  02/26/14 1810  NA 141  K 3.7  CL 103  CO2 23  GLUCOSE 185*  BUN 14  CREATININE 0.85  CALCIUM 9.0   PT/INR No results found for this basename: LABPROT, INR,  in the last 72 hours Cholesterol  Recent Labs  03/01/14 0511  CHOL 170   Lipid Panel     Component Value Date/Time   CHOL 170 03/01/2014 0511   TRIG 182* 03/01/2014 0511   HDL 47 03/01/2014 0511   CHOLHDL 3.6 03/01/2014 0511   VLDL 36 03/01/2014 0511   LDLCALC 87 03/01/2014 0511     Assessment/Plan   Principal Problem:   Atypical chest pain Ruled out for MI.  Echo: EF 55%.  Normal wall motion, grade two diastolic dysfunction.  ASA.  GI following.  Will sign off.   Active Problems:    Obesity  Weight loss discussed. Carbohydrate reduction.    Essential hypertension, benign  Well controlled on  benazepril 20,  Coreg 3.125 BID,    DM type 2 (diabetes mellitus, type 2)  Metformin and novolog.  9.4   Stable asthma   Vomiting and diarrhea    LOS: 3 days    HAGER, BRYAN PA-C 03/01/2014 9:14 AM    Patient seen and examined. Agree with assessment and plan. Primary problem is GI. Still with epigastric and RUQ discomfort. Cardiac status stable. Discussed improtance of weight loss, avoidance of sugars and carbohydrates, exercise and improved diabetic management. Will sign off and be available if cardiac issues arise.   Troy Sine, MD, [facility] 03/01/2014 10:31 AM

## 2014-03-01 NOTE — Consult Note (Signed)
Reason for Consult:  Biliary dyskinesia Referring Physician: Dr. Marylu Lund  [name] is an 43 y.o. female.  HPI:  Pt admitted with weeks of daily nausea, vomiting, severe GERD, she cannot lie down flat because of this.  Daily abdominal pain along both the RUQ and LUQ.  It is not affected by PO intake, she says eggs make her sick, she has easy filling with PO's, she can eat fried chicken just fine.  She  presented with chest pain, abdominal pain, constipation, nausea and vomiting.  Work up from a cardiac standpoint is negative.   LFT's and lipase were normal on admission.  CXR is normal, abdominal ultrasound shows no gallstones, pericholecystic fluid or wall thickening.  Positive for hepatic steatosis.  HIDA scan shows normal function with tracer in GB in 15 min, EF 33%, but pain with CCK administration.  We are ask to see.   Past Medical History  Diagnosis Date  . Hypertension   . Hypokalemia   . Asthma   . DM (diabetes mellitus)   A1C - 9.4     INSULIN DEPENDENT  . Depression   . GERD (gastroesophageal reflux disease)   . Headache(784.0) Sleep Apnea, non compliant with CPAP Hx of tobacco use Migraines Chronic back pain Body mass index is 45.82 kg/(m^2).      Past Surgical History  Procedure Laterality Date  . Cesarean section      x 3  . Appendectomy    . Vesicovaginal fistula closure w/ tah  2009  . Hernia repair      Family History  Problem Relation Age of Onset  . Heart attack Mother   . Stroke Mother   . Diabetes Mother   . Hypertension Mother   . Arthritis Mother   . Stroke Father   . Hypertension Sister   . Diabetes Sister   . Seizures Brother   . Diabetes Brother     Social History:  reports that she quit smoking about 2 years ago. Her smoking use included Cigarettes. She has a 6.6 pack-year smoking history. She has never used smokeless tobacco. She reports that she drinks alcohol. She reports that she does not use illicit drugs.  Allergies: No  Known Allergies  Medications:  Prior to Admission:  Prescriptions prior to admission  Medication Sig Dispense Refill  . albuterol (PROVENTIL HFA;VENTOLIN HFA) 108 (90 BASE) MCG/ACT inhaler Inhale 2 puffs into the lungs every 6 (six) hours as needed for wheezing.  3 Inhaler  3  . albuterol (PROVENTIL) (2.5 MG/3ML) 0.083% nebulizer solution Take 3 mLs (2.5 mg total) by nebulization every 6 (six) hours as needed for wheezing.  75 mL  11  . amLODipine (NORVASC) 10 MG tablet Take 1 tablet (10 mg total) by mouth daily.  90 tablet  3  . aspirin EC 325 MG tablet Take 1 tablet (325 mg total) by mouth daily.  30 tablet  5  . benazepril-hydrochlorthiazide (LOTENSIN HCT) 20-25 MG per tablet Take 1 tablet by mouth daily.  30 tablet  11  . carvedilol (COREG) 3.125 MG tablet Take 1 tablet (3.125 mg total) by mouth 2 (two) times daily with a meal.  60 tablet  5  . cyclobenzaprine (FLEXERIL) 5 MG tablet Take 1 tablet (5 mg total) by mouth 3 (three) times daily as needed for muscle spasms.  90 tablet  2  . Dapagliflozin Propanediol 5 MG TABS Take 5 mg by mouth daily.  30 tablet  4  . Fluticasone-Salmeterol (ADVAIR DISKUS)  250-50 MCG/DOSE AEPB Inhale 1 puff into the lungs 2 (two) times daily.  3 each  3  . gabapentin (NEURONTIN) 400 MG capsule Take 1 capsule (400 mg total) by mouth 3 (three) times daily.  90 capsule  5  . glucose blood test strip Use as instructed  100 each  12  . glucose monitoring kit (FREESTYLE) monitoring kit 1 each by Does not apply route 4 (four) times daily - after meals and at bedtime. 1 month Diabetic Testing Supplies for QAC-QHS accuchecks.  1 each  1  . insulin NPH-regular Human (RELION 70/30) (70-30) 100 UNIT/ML injection Inject 50 Units into the skin 2 (two) times daily with a meal.  12 vial  2  . ipratropium (ATROVENT) 0.02 % nebulizer solution Take 2.5 mLs (0.5 mg total) by nebulization 4 (four) times daily.  75 mL  12  . meloxicam (MOBIC) 7.5 MG tablet Take 1 tablet (7.5 mg total)  by mouth daily.  30 tablet  1  . metFORMIN (GLUCOPHAGE) 1000 MG tablet Take 1 tablet (1,000 mg total) by mouth 2 (two) times daily with a meal.  60 tablet  11  . traMADol (ULTRAM) 50 MG tablet Take 1-2 tablets (50-100 mg total) by mouth every 6 (six) hours as needed for moderate pain.  60 tablet  1   Scheduled: . amLODipine  10 mg Oral Daily  . aspirin EC  81 mg Oral Daily  . benazepril  20 mg Oral Daily  . bisacodyl  10 mg Rectal Once  . carvedilol  3.125 mg Oral BID WC  . enoxaparin (LOVENOX) injection  40 mg Subcutaneous Q24H  . gabapentin  400 mg Oral TID  . insulin aspart  0-15 Units Subcutaneous TID WC  . insulin aspart  0-5 Units Subcutaneous QHS  . insulin detemir  10 Units Subcutaneous QHS  . lubiprostone  24 mcg Oral BID WC  . metFORMIN  1,000 mg Oral BID WC  . mometasone-formoterol  2 puff Inhalation BID  . pantoprazole  40 mg Oral BID  . sorbitol, milk of mag, mineral oil, glycerin (SMOG) enema  960 mL Rectal Once   Continuous:  XID:[id number], acetaminophen, albuterol, cyclobenzaprine, ketorolac, morphine injection, sorbitol, traMADol Anti-infectives   None      Results for orders placed during the hospital encounter of 02/26/14 (from the past 48 hour(s))  GLUCOSE, CAPILLARY     Status: Abnormal   Collection Time    02/27/14  4:46 PM      Result Value Ref Range   Glucose-Capillary 180 (*) 70 - 99 mg/dL  GLUCOSE, CAPILLARY     Status: Abnormal   Collection Time    02/27/14  9:53 PM      Result Value Ref Range   Glucose-Capillary 269 (*) 70 - 99 mg/dL  GLUCOSE, CAPILLARY     Status: Abnormal   Collection Time    02/28/14  7:38 AM      Result Value Ref Range   Glucose-Capillary 181 (*) 70 - 99 mg/dL  GLUCOSE, CAPILLARY     Status: Abnormal   Collection Time    02/28/14 11:34 AM      Result Value Ref Range   Glucose-Capillary 143 (*) 70 - 99 mg/dL  GLUCOSE, CAPILLARY     Status: Abnormal   Collection Time    02/28/14  5:57 PM      Result Value Ref Range    Glucose-Capillary 187 (*) 70 - 99 mg/dL  GLUCOSE, CAPILLARY  Status: Abnormal   Collection Time    02/28/14  8:29 PM      Result Value Ref Range   Glucose-Capillary 227 (*) 70 - 99 mg/dL  LIPID PANEL     Status: Abnormal   Collection Time    03/01/14  5:11 AM      Result Value Ref Range   Cholesterol 170  0 - 200 mg/dL   Triglycerides 182 (*) <150 mg/dL   HDL 47  >39 mg/dL   Total CHOL/HDL Ratio 3.6     VLDL 36  0 - 40 mg/dL   LDL Cholesterol 87  0 - 99 mg/dL   Comment:            Total Cholesterol/HDL:CHD Risk     Coronary Heart Disease Risk Table                         Men   Women      1/2 Average Risk   3.4   3.3      Average Risk       5.0   4.4      2 X Average Risk   9.6   7.1      3 X Average Risk  23.4   11.0                Use the calculated Patient Ratio     above and the CHD Risk Table     to determine the patient's CHD Risk.                ATP III CLASSIFICATION (LDL):      <100     mg/dL   Optimal      100-129  mg/dL   Near or Above                        Optimal      130-159  mg/dL   Borderline      160-189  mg/dL   High      >190     mg/dL   Very High  GLUCOSE, CAPILLARY     Status: Abnormal   Collection Time    03/01/14  7:22 AM      Result Value Ref Range   Glucose-Capillary 173 (*) 70 - 99 mg/dL  GLUCOSE, CAPILLARY     Status: Abnormal   Collection Time    03/01/14 11:18 AM      Result Value Ref Range   Glucose-Capillary 235 (*) 70 - 99 mg/dL    US Abdomen Complete  02/28/2014   CLINICAL DATA:  Pain with nausea and vomiting  EXAM: ULTRASOUND ABDOMEN COMPLETE  COMPARISON:  December 10, 2010  FINDINGS: Gallbladder:  No gallstones or wall thickening visualized. There is no pericholecystic fluid. No sonographic Murphy sign noted.  Common bile duct:  Diameter: 2 mm. There is no intrahepatic, common hepatic, or common bile duct dilatation.  Liver:  No focal lesion identified. Liver echogenicity is diffusely increased.  IVC:  No abnormality visualized.   Pancreas:  No mass or inflammatory focus.  Spleen:  Size and appearance within normal limits.  Right Kidney:  Length: 11.1 cm. Echogenicity within normal limits. No mass or hydronephrosis visualized.  Left Kidney:  Length: 11.0 cm. Echogenicity within normal limits. No mass or hydronephrosis visualized.  Abdominal aorta:  No aneurysm visualized.  Other findings:  No demonstrable ascites.  IMPRESSION: There is  again noted increased liver echogenicity consistent with hepatic steatosis. While no focal liver lesions are identified, it must be cautioned that the sensitivity of ultrasound for focal liver lesions is diminished in this circumstance. Study otherwise unremarkable.   Electronically Signed   By: Lowella Grip M.D.   On: 02/28/2014 08:15   Nm Hepato W/eject Fract  02/28/2014   CLINICAL DATA:  43 year old female with epigastric abdominal pain. Nausea vomiting. Initial encounter.  EXAM: NUCLEAR MEDICINE HEPATOBILIARY IMAGING WITH GALLBLADDER EF  TECHNIQUE: Sequential images of the abdomen were obtained out to 60 minutes following intravenous administration of radiopharmaceutical. After slow intravenous infusion of 2 point to micrograms Cholecystokinin, gallbladder ejection fraction was determined.  RADIOPHARMACEUTICALS:  5.0 [employer] UE-45W Choletec  COMPARISON:  Abdominal ultrasound 02/28/2014. CT Abdomen and Pelvis 11/10/2013.  FINDINGS: Good radiotracer uptake by the liver and clearance of the blood pool.  Prompt gallbladder activity by 15 min. Small bowel activity beginning at 60 min.  Gallbladder ejection fraction calculated to be 33% over 30 min. At 30 min, normal ejection fraction is greater than 30%.  The patient did experience abdominal pain symptoms during CCK infusion.  IMPRESSION: Lower limits of normal gallbladder ejection fraction. But prompt gallbladder radiotracer activity on the study arguing against chronic cholecystitis. CCK administration did produce abdominal pain symptoms.    Electronically Signed   By: Lars Pinks M.D.   On: 02/28/2014 17:17   Dg Abd Portable 1v  03/01/2014   CLINICAL DATA:  constipation  EXAM: PORTABLE ABDOMEN - 1 VIEW  COMPARISON:  11/10/2013  FINDINGS: No disproportionate dilatation of bowel. Phleboliths project over the pelvis. No obvious free intraperitoneal gas.  IMPRESSION: Nonobstructive bowel gas pattern.   Electronically Signed   By: Maryclare Bean M.D.   On: 03/01/2014 13:18    Review of Systems  Constitutional: Negative.   HENT: Negative.   Eyes: Positive for blurred vision.       Cannot see well, but no insurance to have vision checked.  Respiratory: Positive for cough and shortness of breath (some DOE). Negative for hemoptysis and sputum production.   Cardiovascular: Positive for chest pain, orthopnea, leg swelling (both feet) and PND.  Gastrointestinal: Positive for heartburn, nausea, vomiting, abdominal pain and constipation. Negative for blood in stool and melena.       Some trouble swallowing especially capsules, she says they clog her up.  Genitourinary: Negative.   Musculoskeletal: Positive for back pain and joint pain (knees).       Pain in ankles  Skin: Negative.   Neurological: Negative.   Endo/Heme/Allergies: Negative.   Psychiatric/Behavioral: The patient is nervous/anxious.    Blood pressure 147/87, pulse 82, temperature 98.4 F (36.9 C), temperature source Oral, resp. rate 18, height 5' 1"  (1.549 m), weight 109.952 kg (242 lb 6.4 oz), SpO2 100.00%. Physical Exam  Constitutional: She is oriented to person, place, and time. She appears well-developed and well-nourished. No distress.  HENT:  Head: Normocephalic and atraumatic.  Nose: Nose normal.  Eyes: Conjunctivae and EOM are normal. Pupils are equal, round, and reactive to light. Right eye exhibits no discharge. Left eye exhibits no discharge. No scleral icterus.  Neck: Normal range of motion. Neck supple. No JVD present. No tracheal deviation present. No thyromegaly  present.  Cardiovascular: Normal rate, regular rhythm, normal heart sounds and intact distal pulses.  Exam reveals no gallop.   No murmur heard. Respiratory: Effort normal and breath sounds normal. No respiratory distress. She has no wheezes. She has no rales. She  exhibits no tenderness.  GI: Soft. Bowel sounds are normal. She exhibits no distension. There is tenderness (she is tender both RUQ and LUQ, LLQ.  RUQ is more tender than other sites.).  Musculoskeletal: She exhibits no edema.  Lymphadenopathy:    She has no cervical adenopathy.  Neurological: She is alert and oriented to person, place, and time. No cranial nerve deficit.  Skin: Skin is warm and dry. No rash noted. She is not diaphoretic. No erythema. No pallor.  Psychiatric: She has a normal mood and affect. Her behavior is normal. Judgment and thought content normal.    Assessment/Plan: 1.  Abdominal pain, nausea, vomiting, chest pain 2.  Probable biliary dyskinesia 3.  Significant GERD 4.  AODM insulin dependant 5.  Hx of tobacco use  6.  Sleep apnea non compliant with CPAP 7.  Hypertension 8.  Hx of asthma, with ongoing inhaler use. 9.  Migraines 10.  Chronic back pain 11.  BMI 45.8   Plan:  I will recheck her labs and see how she feels after sorbital and enema.  Make her NPO after MN.  The +CCK, and more pain in RUQ are her most significant findings at this point.  She has a 50% chance of improvement with cholecystectomy.  We will see in AM and discuss.    [name] 03/01/2014, 4:41 PM

## 2014-03-01 NOTE — Progress Notes (Signed)
Patient ID: [name], female   DOB: 31-Oct-1970, 43 y.o.   MRN: [id number] [facility] PROGRESS NOTE  [name] SNK:[id number] DOB: 12-Nov-1970 DOA: 02/26/2014 PCP: Angelica Chessman, MD  Brief narrative: 43 y.o. female with past medical history of hypertension, obesity, diabetes with diabetic neuropathy who presented to [facility] ED 02/26/2014 with several day history of continuous substernal chest pain, 10/10 in intensity not relieved with NSAID's but did report relieved with lying on her left side. She also reported having diarrhea for past 24 hours PTA. She has received 3 nitroglycerin in ED without relief. Morphine brought her pain down to a 7/10. EKG and troponin were normal on admission. Chest x-ray negative. White blood cell count normal. Liver function tests and lipase normal.   Assessment/Plan:   Principal Problem:  Atypical chest pain  - less likely ACS; serial cardiac enzymes are WNL and 12 lead EKG showed no acute changes - per cardio, further studies are not required at this time and recommendation was to proceed with GI eval - continue aspirin daily  Abdominal pain, nausea, diarrhea  - unclear etiology; abdominal US showed hepatic steatosis - per GI, recommended eval with HIDA scan with low-normal EF - appreciated GI consltation - GI recs for surgical consultation - will consult - continued protonix 40 mg Q 12 hours Obesity  - nutrition consulted  Essential hypertension, benign  - continue Norvasc, benazepril. coreg  DM type 2 (diabetes mellitus, type 2) with complications of diabetic neuropathy  - A1c on this admission is 9.4 - appreciate DM coordinator input - restart metformin which is pt home med; she is also on large dose of novolog 70/30 50 units BID and currently only on Lantus 10 units with DM coordinator recs to increase to 13 units while inpatient and resume home 70/30 insulin on d/c - neurontin for neuropathy  Stable asthma  - continue dulera   DVT  prophylaxis: Lovenox sub Q while in patient   Code Status: full code  Family Communication: plan of care discussed with the patient at the bedside  Disposition Plan: home when stable   Consultants:  GI (Dr. Collene Mares)  Cardiology (Dr. Claiborne Billings) Procedures/Studies:  Abdominal US 02/28/2014  HIDA 02/28/14 - EF 33% with pain reproduced with CCK Antibiotics:  None    [name], Orpah Melter, MD  [facility] Pager [phone]  If 7PM-7AM, please contact night-coverage [email or web address] Password [email or web address] 03/01/2014, 9:30 AM   LOS: 3 days    HPI/Subjective: Reports continued abd pain and nausea this AM after breakfast. Also reports mild migraine HA.  Objective: Filed Vitals:   02/28/14 2017 02/28/14 2100 03/01/14 0522 03/01/14 0853  BP:  139/81 121/82 125/82  Pulse:  93 79 82  Temp:  98.8 F (37.1 C) 98.4 F (36.9 C)   TempSrc:  Oral Oral   Resp:  18 18   Height:      Weight:   242 lb 6.4 oz (109.952 kg)   SpO2: 98% 98% 100%    No intake or output data in the 24 hours ending 03/01/14 0930  Exam:   General:  Pt is not in distress  Cardiovascular: Regular rate and rhythm, S1/S2, no murmurs  Respiratory: Clear to auscultation bilaterally, no wheezing, no crackles, no rhonchi  Abdomen: tender in epigastric and RUQ abdominal area, no rebound or guarding, bowel sounds present  Extremities: No edema, pulses DP and PT palpable bilaterally  Neuro: Grossly nonfocal  Data Reviewed: Basic Metabolic Panel:  Recent Labs Lab 02/26/14 1810  NA 141  K 3.7  CL 103  CO2 23  GLUCOSE 185*  BUN 14  CREATININE 0.85  CALCIUM 9.0   Liver Function Tests:  Recent Labs Lab 02/26/14 1757  AST 15  ALT 18  ALKPHOS 100  BILITOT <0.2*  PROT 6.9  ALBUMIN 3.3*    Recent Labs Lab 02/26/14 1757  LIPASE 32   No results found for this basename: AMMONIA,  in the last 168 hours CBC:  Recent Labs Lab 02/26/14 1810  WBC 8.2  HGB 12.2  HCT 36.3  MCV 87.1  PLT 317   Cardiac  Enzymes:  Recent Labs Lab 02/26/14 2325 02/27/14 0335 02/27/14 0930  TROPONINI <0.30 <0.30 <0.30   BNP: No components found with this basename: POCBNP,  CBG:  Recent Labs Lab 02/28/14 0738 02/28/14 1134 02/28/14 1757 02/28/14 2029 03/01/14 0722  GLUCAP 181* 143* 187* 227* 173*    No results found for this or any previous visit (from the past 240 hour(s)).   Studies: Dg Chest 2 View 02/26/2014    IMPRESSION: No active cardiopulmonary disease.     US Abdomen Complete 02/28/2014    IMPRESSION: There is again noted increased liver echogenicity consistent with hepatic steatosis. While no focal liver lesions are identified, it must be cautioned that the sensitivity of ultrasound for focal liver lesions is diminished in this circumstance. Study otherwise unremarkable.      Scheduled Meds: . amLODipine  10 mg Oral Daily  . aspirin EC  81 mg Oral Daily  . benazepril  20 mg Oral Daily  . carvedilol  3.125 mg Oral BID WC  . enoxaparin (LOVENOX)   40 mg Subcutaneous Q24H  . gabapentin  400 mg Oral TID  . insulin aspart  0-15 Units Subcutaneous TID WC  . insulin aspart  0-5 Units Subcutaneous QHS  . insulin detemir  10 Units Subcutaneous QHS  . mometasone-formoterol  2 puff Inhalation BID  . pantoprazole   40 mg Intravenous Q12H

## 2014-03-01 NOTE — Discharge Instructions (Addendum)
Diabetes Meal Planning Guide The diabetes meal planning guide is a tool to help you plan your meals and snacks. It is important for people with diabetes to manage their blood glucose (sugar) levels. Choosing the right foods and the right amounts throughout your day will help control your blood glucose. Eating right can even help you improve your blood pressure and reach or maintain a healthy weight. CARBOHYDRATE COUNTING MADE EASY When you eat carbohydrates, they turn to sugar. This raises your blood glucose level. Counting carbohydrates can help you control this level so you feel better. When you plan your meals by counting carbohydrates, you can have more flexibility in what you eat and balance your medicine with your food intake. Carbohydrate counting simply means adding up the total amount of carbohydrate grams in your meals and snacks. Try to eat about the same amount at each meal. Foods with carbohydrates are listed below. Each portion below is 1 carbohydrate serving or 15 grams of carbohydrates. Ask your dietician how many grams of carbohydrates you should eat at each meal or snack. Grains and Starches  1 slice bread.   English muffin or hotdog/hamburger bun.   cup cold cereal (unsweetened).   cup cooked pasta or rice.   cup starchy vegetables (corn, potatoes, peas, beans, winter squash).  1 tortilla (6 inches).   bagel.  1 waffle or pancake (size of a CD).   cup cooked cereal.  4 to 6 small crackers. *Whole grain is recommended. Fruit  1 cup fresh unsweetened berries, melon, papaya, pineapple.  1 small fresh fruit.   banana or mango.   cup fruit juice (4 oz unsweetened).   cup canned fruit in natural juice or water.  2 tbs dried fruit.  12 to 15 grapes or cherries. Milk and Yogurt  1 cup fat-free or 1% milk.  1 cup soy milk.  6 oz light yogurt with sugar-free sweetener.  6 oz low-fat soy yogurt.  6 oz plain yogurt. Vegetables  1 cup raw or  cup  cooked is counted as 0 carbohydrates or a "free" food.  If you eat 3 or more servings at 1 meal, count them as 1 carbohydrate serving. Other Carbohydrates   oz chips or pretzels.   cup ice cream or frozen yogurt.   cup sherbet or sorbet.  2 inch square cake, no frosting.  1 tbs honey, sugar, jam, jelly, or syrup.  2 small cookies.  3 squares of graham crackers.  3 cups popcorn.  6 crackers.  1 cup broth-based soup.  Count 1 cup casserole or other mixed foods as 2 carbohydrate servings.  Foods with less than 20 calories in a serving may be counted as 0 carbohydrates or a "free" food. You may want to purchase a book or computer software that lists the carbohydrate gram counts of different foods. In addition, the nutrition facts panel on the labels of the foods you eat are a good source of this information. The label will tell you how big the serving size is and the total number of carbohydrate grams you will be eating per serving. Divide this number by 15 to obtain the number of carbohydrate servings in a portion. Remember, 1 carbohydrate serving equals 15 grams of carbohydrate. SERVING SIZES Measuring foods and serving sizes helps you make sure you are getting the right amount of food. The list below tells how big or small some common serving sizes are.  1 oz.........4 stacked dice.  3 oz........Marland KitchenDeck of cards.  1 tsp.......Marland KitchenTip  of little finger.  1 tbs......Marland KitchenMarland KitchenThumb.  2 tbs.......Marland KitchenGolf ball.   cup......Marland KitchenHalf of a fist.  1 cup.......Marland KitchenA fist. SAMPLE DIABETES MEAL PLAN Below is a sample meal plan that includes foods from the grain and starches, dairy, vegetable, fruit, and meat groups. A dietician can individualize a meal plan to fit your calorie needs and tell you the number of servings needed from each food group. However, controlling the total amount of carbohydrates in your meal or snack is more important than making sure you include all of the food groups at every  meal. You may interchange carbohydrate containing foods (dairy, starches, and fruits). The meal plan below is an example of a 2000 calorie diet using carbohydrate counting. This meal plan has 17 carbohydrate servings. Breakfast  1 cup oatmeal (2 carb servings).   cup light yogurt (1 carb serving).  1 cup blueberries (1 carb serving).   cup almonds. Snack  1 large apple (2 carb servings).  1 low-fat string cheese stick. Lunch  Chicken breast salad.  1 cup spinach.   cup chopped tomatoes.  2 oz chicken breast, sliced.  2 tbs low-fat [place] dressing.  12 whole-wheat crackers (2 carb servings).  12 to 15 grapes (1 carb serving).  1 cup low-fat milk (1 carb serving). Snack  1 cup carrots.   cup hummus (1 carb serving). Dinner  3 oz broiled salmon.  1 cup brown rice (3 carb servings). Snack  1  cups steamed broccoli (1 carb serving) drizzled with 1 tsp olive oil and lemon juice.  1 cup light pudding (2 carb servings). DIABETES MEAL PLANNING WORKSHEET Your dietician can use this worksheet to help you decide how many servings of foods and what types of foods are right for you.  BREAKFAST Food Group and Servings / Carb Servings Grain/Starches __________________________________ Dairy __________________________________________ Vegetable ______________________________________ Fruit ___________________________________________ Meat __________________________________________ Fat ____________________________________________ LUNCH Food Group and Servings / Carb Servings Grain/Starches ___________________________________ Dairy ___________________________________________ Fruit ____________________________________________ Meat ___________________________________________ Fat _____________________________________________ [name] Food Group and Servings / Carb Servings Grain/Starches ___________________________________ Dairy  ___________________________________________ Fruit ____________________________________________ Meat ___________________________________________ Fat _____________________________________________ SNACKS Food Group and Servings / Carb Servings Grain/Starches ___________________________________ Dairy ___________________________________________ Vegetable _______________________________________ Fruit ____________________________________________ Meat ___________________________________________ Fat _____________________________________________ DAILY TOTALS Starches _________________________ Vegetable ________________________ Fruit ____________________________ Dairy ____________________________ Meat ____________________________ Fat ______________________________ Document Released: 06/05/2005 Document Revised: 12/01/2011 Document Reviewed: 04/16/2009 [employer] Patient Information 2014 [employer]   Diabetes and Standards of Medical Care  Diabetes is complicated. You may find that your diabetes team includes a dietitian, nurse, diabetes educator, eye doctor, and more. To help everyone know what is going on and to help you get the care you deserve, the following schedule of care was developed to help keep you on track. Below are the tests, exams, vaccines, medicines, education, and plans you will need. HbA1c test This test shows how well you have controlled your glucose over the past 2 3 months. It is used to see if your diabetes management plan needs to be adjusted.   It is performed at least 2 times a year if you are meeting treatment goals.  It is performed 4 times a year if therapy has changed or if you are not meeting treatment goals. Blood pressure test  This test is performed at every routine medical visit. The goal is less than 140/90 mmHg for most people, but 130/80 mmHg in some cases. Ask your health care provider about your goal. Dental exam  Follow up with the dentist  regularly. Eye exam  If you  are diagnosed with type 1 diabetes as a child, get an exam upon reaching the age of 67 years or older and have had diabetes for 3 5 years. Yearly eye exams are recommended after that initial eye exam.  If you are diagnosed with type 1 diabetes as an adult, get an exam within 5 years of diagnosis and then yearly.  If you are diagnosed with type 2 diabetes, get an exam as soon as possible after the diagnosis and then yearly. Foot care exam  Visual foot exams are performed at every routine medical visit. The exams check for cuts, injuries, or other problems with the feet.  A comprehensive foot exam should be done yearly. This includes visual inspection as well as assessing foot pulses and testing for loss of sensation.  Check your feet nightly for cuts, injuries, or other problems with your feet. Tell your health care provider if anything is not healing. Kidney function test (urine microalbumin)  This test is performed once a year.  Type 1 diabetes: The first test is performed 5 years after diagnosis.  Type 2 diabetes: The first test is performed at the time of diagnosis.  A serum creatinine and estimated glomerular filtration rate (eGFR) test is done once a year to assess the level of chronic kidney disease (CKD), if present. Lipid profile (cholesterol, HDL, LDL, triglycerides)  Performed every 5 years for most people.  The goal for LDL is less than 100 mg/dL. If you are at high risk, the goal is less than 70 mg/dL.  The goal for HDL is 40 mg/dL 50 mg/dL for men and 50 mg/dL 60 mg/dL for women. An HDL cholesterol of 60 mg/dL or higher gives some protection against heart disease.  The goal for triglycerides is less than 150 mg/dL. Influenza vaccine, pneumococcal vaccine, and hepatitis B vaccine  The influenza vaccine is recommended yearly.  The pneumococcal vaccine is generally given once in a lifetime. However, there are some instances when another  vaccination is recommended. Check with your health care provider.  The hepatitis B vaccine is also recommended for adults with diabetes. Diabetes self-management education  Education is recommended at diagnosis and ongoing as needed. Treatment plan  Your treatment plan is reviewed at every medical visit. Document Released: 07/06/2009 Document Revised: 05/11/2013 Document Reviewed: 02/08/2013 [facility] Patient Information 2014 [place].   Diabetes and Exercise Exercising regularly is important. It is not just about losing weight. It has many health benefits, such as:  Improving your overall fitness, flexibility, and endurance.  Increasing your bone density.  Helping with weight control.  Decreasing your body fat.  Increasing your muscle strength.  Reducing stress and tension.  Improving your overall health. People with diabetes who exercise gain additional benefits because exercise:  Reduces appetite.  Improves the body's use of blood sugar (glucose).  Helps lower or control blood glucose.  Decreases blood pressure.  Helps control blood lipids (such as cholesterol and triglycerides).  Improves the body's use of the hormone insulin by:  Increasing the body's insulin sensitivity.  Reducing the body's insulin needs.  Decreases the risk for heart disease because exercising:  Lowers cholesterol and triglycerides levels.  Increases the levels of good cholesterol (such as high-density lipoproteins [HDL]) in the body.  Lowers blood glucose levels. YOUR ACTIVITY PLAN  Choose an activity that you enjoy and set realistic goals. Your health care provider or diabetes educator can help you make an activity plan that works for you. You can break activities into 2  or 3 sessions throughout the day. Doing so is as good as one long session. Exercise ideas include:  Taking the dog for a walk.  Taking the stairs instead of the elevator.  Dancing to your favorite  song.  Doing your favorite exercise with a friend. RECOMMENDATIONS FOR EXERCISING WITH TYPE 1 OR TYPE 2 DIABETES   Check your blood glucose before exercising. If blood glucose levels are greater than 240 mg/dL, check for urine ketones. Do not exercise if ketones are present.  Avoid injecting insulin into areas of the body that are going to be exercised. For example, avoid injecting insulin into:  The arms when playing tennis.  The legs when jogging.  Keep a record of:  Food intake before and after you exercise.  Expected peak times of insulin action.  Blood glucose levels before and after you exercise.  The type and amount of exercise you have done.  Review your records with your health care provider. Your health care provider will help you to develop guidelines for adjusting food intake and insulin amounts before and after exercising.  If you take insulin or oral hypoglycemic agents, watch for signs and symptoms of hypoglycemia. They include:  Dizziness.  Shaking.  Sweating.  Chills.  Confusion.  Drink plenty of water while you exercise to prevent dehydration or heat stroke. Body water is lost during exercise and must be replaced.  Talk to your health care provider before starting an exercise program to make sure it is safe for you. Remember, almost any type of activity is better than none. Document Released: 11/29/2003 Document Revised: 05/11/2013 Document Reviewed: 02/15/2013 [facility] Patient Information 2014 [place].  Diabetes Meal Planning Guide The diabetes meal planning guide is a tool to help you plan your meals and snacks. It is important for people with diabetes to manage their blood glucose (sugar) levels. Choosing the right foods and the right amounts throughout your day will help control your blood glucose. Eating right can even help you improve your blood pressure and reach or maintain a healthy weight. CARBOHYDRATE COUNTING MADE EASY When you eat  carbohydrates, they turn to sugar. This raises your blood glucose level. Counting carbohydrates can help you control this level so you feel better. When you plan your meals by counting carbohydrates, you can have more flexibility in what you eat and balance your medicine with your food intake. Carbohydrate counting simply means adding up the total amount of carbohydrate grams in your meals and snacks. Try to eat about the same amount at each meal. Foods with carbohydrates are listed below. Each portion below is 1 carbohydrate serving or 15 grams of carbohydrates. Ask your dietician how many grams of carbohydrates you should eat at each meal or snack. Grains and Starches  1 slice bread.   English muffin or hotdog/hamburger bun.   cup cold cereal (unsweetened).   cup cooked pasta or rice.   cup starchy vegetables (corn, potatoes, peas, beans, winter squash).  1 tortilla (6 inches).   bagel.  1 waffle or pancake (size of a CD).   cup cooked cereal.  4 to 6 small crackers. *Whole grain is recommended. Fruit  1 cup fresh unsweetened berries, melon, papaya, pineapple.  1 small fresh fruit.   banana or mango.   cup fruit juice (4 oz unsweetened).   cup canned fruit in natural juice or water.  2 tbs dried fruit.  12 to 15 grapes or cherries. Milk and Yogurt  1 cup fat-free or 1% milk.  1 cup soy milk.  6 oz light yogurt with sugar-free sweetener.  6 oz low-fat soy yogurt.  6 oz plain yogurt. Vegetables  1 cup raw or  cup cooked is counted as 0 carbohydrates or a "free" food.  If you eat 3 or more servings at 1 meal, count them as 1 carbohydrate serving. Other Carbohydrates   oz chips or pretzels.   cup ice cream or frozen yogurt.   cup sherbet or sorbet.  2 inch square cake, no frosting.  1 tbs honey, sugar, jam, jelly, or syrup.  2 small cookies.  3 squares of graham crackers.  3 cups popcorn.  6 crackers.  1 cup broth-based  soup.  Count 1 cup casserole or other mixed foods as 2 carbohydrate servings.  Foods with less than 20 calories in a serving may be counted as 0 carbohydrates or a "free" food. You may want to purchase a book or computer software that lists the carbohydrate gram counts of different foods. In addition, the nutrition facts panel on the labels of the foods you eat are a good source of this information. The label will tell you how big the serving size is and the total number of carbohydrate grams you will be eating per serving. Divide this number by 15 to obtain the number of carbohydrate servings in a portion. Remember, 1 carbohydrate serving equals 15 grams of carbohydrate. SERVING SIZES Measuring foods and serving sizes helps you make sure you are getting the right amount of food. The list below tells how big or small some common serving sizes are.  1 oz.........4 stacked dice.  3 oz........Marland KitchenDeck of cards.  1 tsp.......Marland KitchenTip of little finger.  1 tbs......Marland KitchenMarland KitchenThumb.  2 tbs.......Marland KitchenGolf ball.   cup......Marland KitchenHalf of a fist.  1 cup.......Marland KitchenA fist. SAMPLE DIABETES MEAL PLAN Below is a sample meal plan that includes foods from the grain and starches, dairy, vegetable, fruit, and meat groups. A dietician can individualize a meal plan to fit your calorie needs and tell you the number of servings needed from each food group. However, controlling the total amount of carbohydrates in your meal or snack is more important than making sure you include all of the food groups at every meal. You may interchange carbohydrate containing foods (dairy, starches, and fruits). The meal plan below is an example of a 2000 calorie diet using carbohydrate counting. This meal plan has 17 carbohydrate servings. Breakfast  1 cup oatmeal (2 carb servings).   cup light yogurt (1 carb serving).  1 cup blueberries (1 carb serving).   cup almonds. Snack  1 large apple (2 carb servings).  1 low-fat string cheese  stick. Lunch  Chicken breast salad.  1 cup spinach.   cup chopped tomatoes.  2 oz chicken breast, sliced.  2 tbs low-fat [place] dressing.  12 whole-wheat crackers (2 carb servings).  12 to 15 grapes (1 carb serving).  1 cup low-fat milk (1 carb serving). Snack  1 cup carrots.   cup hummus (1 carb serving). Dinner  3 oz broiled salmon.  1 cup brown rice (3 carb servings). Snack  1  cups steamed broccoli (1 carb serving) drizzled with 1 tsp olive oil and lemon juice.  1 cup light pudding (2 carb servings). DIABETES MEAL PLANNING WORKSHEET Your dietician can use this worksheet to help you decide how many servings of foods and what types of foods are right for you.  BREAKFAST Food Group and Servings / Carb Servings Grain/Starches __________________________________ Dairy __________________________________________ Vegetable ______________________________________  Fruit ___________________________________________ Meat __________________________________________ Fat ____________________________________________ LUNCH Food Group and Servings / Carb Servings Grain/Starches ___________________________________ Dairy ___________________________________________ Fruit ____________________________________________ Meat ___________________________________________ Fat _____________________________________________ [name] Food Group and Servings / Carb Servings Grain/Starches ___________________________________ Dairy ___________________________________________ Fruit ____________________________________________ Meat ___________________________________________ Fat _____________________________________________ SNACKS Food Group and Servings / Carb Servings Grain/Starches ___________________________________ Dairy ___________________________________________ Vegetable _______________________________________ Fruit ____________________________________________ Meat  ___________________________________________ Fat _____________________________________________ DAILY TOTALS Starches _________________________ Vegetable ________________________ Fruit ____________________________ Dairy ____________________________ Meat ____________________________ Fat ______________________________ Document Released: 06/05/2005 Document Revised: 12/01/2011 Document Reviewed: 04/16/2009 [employer] Patient Information 2014 [employer].  Diabetes Meal Planning Guide The diabetes meal planning guide is a tool to help you plan your meals and snacks. It is important for people with diabetes to manage their blood glucose (sugar) levels. Choosing the right foods and the right amounts throughout your day will help control your blood glucose. Eating right can even help you improve your blood pressure and reach or maintain a healthy weight. CARBOHYDRATE COUNTING MADE EASY When you eat carbohydrates, they turn to sugar. This raises your blood glucose level. Counting carbohydrates can help you control this level so you feel better. When you plan your meals by counting carbohydrates, you can have more flexibility in what you eat and balance your medicine with your food intake. Carbohydrate counting simply means adding up the total amount of carbohydrate grams in your meals and snacks. Try to eat about the same amount at each meal. Foods with carbohydrates are listed below. Each portion below is 1 carbohydrate serving or 15 grams of carbohydrates. Ask your dietician how many grams of carbohydrates you should eat at each meal or snack. Grains and Starches  1 slice bread.   English muffin or hotdog/hamburger bun.   cup cold cereal (unsweetened).   cup cooked pasta or rice.   cup starchy vegetables (corn, potatoes, peas, beans, winter squash).  1 tortilla (6 inches).   bagel.  1 waffle or pancake (size of a CD).   cup cooked cereal.  4 to 6 small crackers. *Whole grain is  recommended. Fruit  1 cup fresh unsweetened berries, melon, papaya, pineapple.  1 small fresh fruit.   banana or mango.   cup fruit juice (4 oz unsweetened).   cup canned fruit in natural juice or water.  2 tbs dried fruit.  12 to 15 grapes or cherries. Milk and Yogurt  1 cup fat-free or 1% milk.  1 cup soy milk.  6 oz light yogurt with sugar-free sweetener.  6 oz low-fat soy yogurt.  6 oz plain yogurt. Vegetables  1 cup raw or  cup cooked is counted as 0 carbohydrates or a "free" food.  If you eat 3 or more servings at 1 meal, count them as 1 carbohydrate serving. Other Carbohydrates   oz chips or pretzels.   cup ice cream or frozen yogurt.   cup sherbet or sorbet.  2 inch square cake, no frosting.  1 tbs honey, sugar, jam, jelly, or syrup.  2 small cookies.  3 squares of graham crackers.  3 cups popcorn.  6 crackers.  1 cup broth-based soup.  Count 1 cup casserole or other mixed foods as 2 carbohydrate servings.  Foods with less than 20 calories in a serving may be counted as 0 carbohydrates or a "free" food. You may want to purchase a book or computer software that lists the carbohydrate gram counts of different foods. In addition, the nutrition facts panel on the labels of the foods you eat are a good source of this information.  The label will tell you how big the serving size is and the total number of carbohydrate grams you will be eating per serving. Divide this number by 15 to obtain the number of carbohydrate servings in a portion. Remember, 1 carbohydrate serving equals 15 grams of carbohydrate. SERVING SIZES Measuring foods and serving sizes helps you make sure you are getting the right amount of food. The list below tells how big or small some common serving sizes are.  1 oz.........4 stacked dice.  3 oz........Marland KitchenDeck of cards.  1 tsp.......Marland KitchenTip of little finger.  1 tbs......Marland KitchenMarland KitchenThumb.  2 tbs.......Marland KitchenGolf ball.   cup......Marland KitchenHalf of  a fist.  1 cup.......Marland KitchenA fist. SAMPLE DIABETES MEAL PLAN Below is a sample meal plan that includes foods from the grain and starches, dairy, vegetable, fruit, and meat groups. A dietician can individualize a meal plan to fit your calorie needs and tell you the number of servings needed from each food group. However, controlling the total amount of carbohydrates in your meal or snack is more important than making sure you include all of the food groups at every meal. You may interchange carbohydrate containing foods (dairy, starches, and fruits). The meal plan below is an example of a 2000 calorie diet using carbohydrate counting. This meal plan has 17 carbohydrate servings. Breakfast  1 cup oatmeal (2 carb servings).   cup light yogurt (1 carb serving).  1 cup blueberries (1 carb serving).   cup almonds. Snack  1 large apple (2 carb servings).  1 low-fat string cheese stick. Lunch  Chicken breast salad.  1 cup spinach.   cup chopped tomatoes.  2 oz chicken breast, sliced.  2 tbs low-fat [place] dressing.  12 whole-wheat crackers (2 carb servings).  12 to 15 grapes (1 carb serving).  1 cup low-fat milk (1 carb serving). Snack  1 cup carrots.   cup hummus (1 carb serving). Dinner  3 oz broiled salmon.  1 cup brown rice (3 carb servings). Snack  1  cups steamed broccoli (1 carb serving) drizzled with 1 tsp olive oil and lemon juice.  1 cup light pudding (2 carb servings). DIABETES MEAL PLANNING WORKSHEET Your dietician can use this worksheet to help you decide how many servings of foods and what types of foods are right for you.  BREAKFAST Food Group and Servings / Carb Servings Grain/Starches __________________________________ Dairy __________________________________________ Vegetable ______________________________________ Fruit ___________________________________________ Meat __________________________________________ Fat  ____________________________________________ LUNCH Food Group and Servings / Carb Servings Grain/Starches ___________________________________ Dairy ___________________________________________ Fruit ____________________________________________ Meat ___________________________________________ Fat _____________________________________________ [name] Food Group and Servings / Carb Servings Grain/Starches ___________________________________ Dairy ___________________________________________ Fruit ____________________________________________ Meat ___________________________________________ Fat _____________________________________________ SNACKS Food Group and Servings / Carb Servings Grain/Starches ___________________________________ Dairy ___________________________________________ Vegetable _______________________________________ Fruit ____________________________________________ Meat ___________________________________________ Fat _____________________________________________ DAILY TOTALS Starches _________________________ Vegetable ________________________ Fruit ____________________________ Dairy ____________________________ Meat ____________________________ Fat ______________________________ Document Released: 06/05/2005 Document Revised: 12/01/2011 Document Reviewed: 04/16/2009 ExitCare Patient Information 2014 [employer]   CCS ______[facility] LAPAROSCOPIC SURGERY: POST OP INSTRUCTIONS Always review your discharge instruction sheet given to you by the facility where your surgery was performed. IF YOU HAVE DISABILITY OR FAMILY LEAVE FORMS, YOU MUST BRING THEM TO THE OFFICE FOR PROCESSING.   DO NOT GIVE THEM TO YOUR DOCTOR.  1. A prescription for pain medication may be given to you upon discharge.  Take your pain medication as prescribed, if needed.  If narcotic pain medicine is not needed, then you may take acetaminophen (Tylenol) or ibuprofen (Advil)  as needed. 2. Take  your usually prescribed medications unless otherwise directed. 3. If you need a refill on your pain medication, please contact your pharmacy.  They will contact our office to request authorization. Prescriptions will not be filled after 5pm or on week-ends. 4. You should follow a light diet the first few days after arrival home, such as soup and crackers, etc.  Be sure to include lots of fluids daily. 5. Most patients will experience some swelling and bruising in the area of the incisions.  Ice packs will help.  Swelling and bruising can take several days to resolve.  6. It is common to experience some constipation if taking pain medication after surgery.  Increasing fluid intake and taking a stool softener (such as Colace) will usually help or prevent this problem from occurring.  A mild laxative (Milk of Magnesia or Miralax) should be taken according to package instructions if there are no bowel movements after 48 hours. 7. Unless discharge instructions indicate otherwise, you may remove your bandages 24-48 hours after surgery, and you may shower at that time.  You may have steri-strips (small skin tapes) in place directly over the incision.  These strips should be left on the skin for 7-10 days.  If your surgeon used skin glue on the incision, you may shower in 24 hours.  The glue will flake off over the next 2-3 weeks.  Any sutures or staples will be removed at the office during your follow-up visit. 8. ACTIVITIES:  You may resume regular (light) daily activities beginning the next day--such as daily self-care, walking, climbing stairs--gradually increasing activities as tolerated.  You may have sexual intercourse when it is comfortable.  Refrain from any heavy lifting or straining until approved by your doctor. a. You may drive when you are no longer taking prescription pain medication, you can comfortably wear a seatbelt, and you can safely maneuver your car and apply  brakes. b. RETURN TO WORK:  __________________________________________________________ 9. You should see your doctor in the office for a follow-up appointment approximately 2-3 weeks after your surgery.  Make sure that you call for this appointment within a day or two after you arrive home to insure a convenient appointment time. 10. OTHER INSTRUCTIONS: __________________________________________________________________________________________________________________________ __________________________________________________________________________________________________________________________ WHEN TO CALL YOUR DOCTOR: 1. Fever over 101.0 2. Inability to urinate 3. Continued bleeding from incision. 4. Increased pain, redness, or drainage from the incision. 5. Increasing abdominal pain  The clinic staff is available to answer your questions during regular business hours.  Please dont hesitate to call and ask to speak to one of the nurses for clinical concerns.  If you have a medical emergency, go to the nearest emergency room or call 911.  A surgeon from [facility] is always on call at the hospital. [address] [place], [place], [place]  [address] ? [address], [place], [place]   [id number] [phone] ? [phone] ? FAX [phone] Web site: [email or web address]

## 2014-03-02 ENCOUNTER — Encounter (HOSPITAL_COMMUNITY): Payer: Self-pay | Admitting: Anesthesiology

## 2014-03-02 ENCOUNTER — Observation Stay (HOSPITAL_COMMUNITY): Payer: Self-pay | Admitting: Anesthesiology

## 2014-03-02 ENCOUNTER — Encounter (HOSPITAL_COMMUNITY)
Admission: EM | Disposition: A | Discharge status: Home / Self Care | Payer: Self-pay | Source: Home / Self Care | Attending: Emergency Medicine

## 2014-03-02 DIAGNOSIS — K811 Chronic cholecystitis: Secondary | ICD-10-CM

## 2014-03-02 DIAGNOSIS — R109 Unspecified abdominal pain: Secondary | ICD-10-CM

## 2014-03-02 HISTORY — PX: CHOLECYSTECTOMY: SHX55

## 2014-03-02 LAB — COMPREHENSIVE METABOLIC PANEL
ALK PHOS: 79 U/L (ref 39–117)
ALT: 26 U/L (ref 0–35)
AST: 23 U/L (ref 0–37)
Albumin: 3.2 g/dL — ABNORMAL LOW (ref 3.5–5.2)
BILIRUBIN TOTAL: 0.3 mg/dL (ref 0.3–1.2)
BUN: 11 mg/dL (ref 6–23)
CHLORIDE: 97 meq/L (ref 96–112)
CO2: 25 meq/L (ref 19–32)
Calcium: 9.2 mg/dL (ref 8.4–10.5)
Creatinine, Ser: 0.86 mg/dL (ref 0.50–1.10)
GFR, EST NON AFRICAN AMERICAN: 81 mL/min — AB (ref >90)
GLUCOSE: 171 mg/dL — AB (ref 70–99)
POTASSIUM: 4 meq/L (ref 3.7–5.3)
Sodium: 138 mEq/L (ref 137–147)
Total Protein: 6.8 g/dL (ref 6.0–8.3)

## 2014-03-02 LAB — GLUCOSE, CAPILLARY
GLUCOSE-CAPILLARY: 130 mg/dL — AB (ref 70–99)
GLUCOSE-CAPILLARY: 144 mg/dL — AB (ref 70–99)
Glucose-Capillary: 145 mg/dL — ABNORMAL HIGH (ref 70–99)
Glucose-Capillary: 182 mg/dL — ABNORMAL HIGH (ref 70–99)
Glucose-Capillary: 171 mg/dL — ABNORMAL HIGH (ref 70–99)

## 2014-03-02 LAB — CBC
HEMATOCRIT: 37.4 % (ref 36.0–46.0)
HEMOGLOBIN: 12.3 g/dL (ref 12.0–15.0)
MCH: 28.7 pg (ref 26.0–34.0)
MCHC: 32.9 g/dL (ref 30.0–36.0)
MCV: 87.4 fL (ref 78.0–100.0)
Platelets: 292 10*3/uL (ref 150–400)
RBC: 4.28 MIL/uL (ref 3.87–5.11)
RDW: 13.9 % (ref 11.5–15.5)
WBC: 8.2 10*3/uL (ref 4.0–10.5)

## 2014-03-02 LAB — CBC WITH DIFFERENTIAL/PLATELET
BASOS PCT: 0 % (ref 0–1)
Basophils Absolute: 0 10*3/uL (ref 0.0–0.1)
EOS ABS: 0.1 10*3/uL (ref 0.0–0.7)
EOS PCT: 1 % (ref 0–5)
HCT: 39 % (ref 36.0–46.0)
Hemoglobin: 13 g/dL (ref 12.0–15.0)
LYMPHS ABS: 3.1 10*3/uL (ref 0.7–4.0)
Lymphocytes Relative: 45 % (ref 12–46)
MCH: 29.1 pg (ref 26.0–34.0)
MCHC: 33.3 g/dL (ref 30.0–36.0)
MCV: 87.4 fL (ref 78.0–100.0)
Monocytes Absolute: 0.5 10*3/uL (ref 0.1–1.0)
Monocytes Relative: 8 % (ref 3–12)
NEUTROS PCT: 46 % (ref 43–77)
Neutro Abs: 3.1 10*3/uL (ref 1.7–7.7)
PLATELETS: 310 10*3/uL (ref 150–400)
RBC: 4.46 MIL/uL (ref 3.87–5.11)
RDW: 13.7 % (ref 11.5–15.5)
WBC: 6.8 10*3/uL (ref 4.0–10.5)

## 2014-03-02 LAB — LIPASE, BLOOD: LIPASE: 25 U/L (ref 11–59)

## 2014-03-02 SURGERY — LAPAROSCOPIC CHOLECYSTECTOMY
Anesthesia: General | Site: Abdomen

## 2014-03-02 MED ORDER — PHENYLEPHRINE 40 MCG/ML (10ML) SYRINGE FOR IV PUSH (FOR BLOOD PRESSURE SUPPORT)
PREFILLED_SYRINGE | INTRAVENOUS | Status: AC
  Filled 2014-03-02: qty 10

## 2014-03-02 MED ORDER — MIDAZOLAM HCL 5 MG/5ML IJ SOLN
INTRAMUSCULAR | Status: DC | PRN
  Administered 2014-03-02: 2 mg via INTRAVENOUS

## 2014-03-02 MED ORDER — LIDOCAINE HCL (CARDIAC) 20 MG/ML IV SOLN
INTRAVENOUS | Status: DC | PRN
  Administered 2014-03-02: 70 mg via INTRAVENOUS

## 2014-03-02 MED ORDER — LORAZEPAM 2 MG/ML IJ SOLN
0.5000 mg | Freq: Once | INTRAMUSCULAR | Status: AC
  Administered 2014-03-02: 0.5 mg via INTRAVENOUS
  Filled 2014-03-02: qty 1

## 2014-03-02 MED ORDER — EPHEDRINE SULFATE 50 MG/ML IJ SOLN
INTRAMUSCULAR | Status: DC | PRN
  Administered 2014-03-02: 5 mg via INTRAVENOUS
  Administered 2014-03-02: 10 mg via INTRAVENOUS

## 2014-03-02 MED ORDER — OXYCODONE-ACETAMINOPHEN 5-325 MG PO TABS
1.0000 | ORAL_TABLET | ORAL | Status: DC | PRN
  Administered 2014-03-02: 2 via ORAL
  Administered 2014-03-03: 1 via ORAL
  Administered 2014-03-03 – 2014-03-04 (×2): 2 via ORAL
  Filled 2014-03-02: qty 2
  Filled 2014-03-02: qty 1
  Filled 2014-03-02 (×2): qty 2

## 2014-03-02 MED ORDER — SUCCINYLCHOLINE CHLORIDE 20 MG/ML IJ SOLN
INTRAMUSCULAR | Status: DC | PRN
  Administered 2014-03-02: 120 mg via INTRAVENOUS

## 2014-03-02 MED ORDER — HYDROMORPHONE HCL PF 1 MG/ML IJ SOLN
0.2500 mg | INTRAMUSCULAR | Status: DC | PRN
Start: 2014-03-02 — End: 2014-03-02
  Administered 2014-03-02: 0.5 mg via INTRAVENOUS

## 2014-03-02 MED ORDER — BUPIVACAINE-EPINEPHRINE (PF) 0.25% -1:200000 IJ SOLN
INTRAMUSCULAR | Status: AC
  Filled 2014-03-02: qty 30

## 2014-03-02 MED ORDER — ROCURONIUM BROMIDE 100 MG/10ML IV SOLN
INTRAVENOUS | Status: DC | PRN
  Administered 2014-03-02: 5 mg via INTRAVENOUS
  Administered 2014-03-02: 20 mg via INTRAVENOUS
  Administered 2014-03-02 (×2): 10 mg via INTRAVENOUS

## 2014-03-02 MED ORDER — ROCURONIUM BROMIDE 50 MG/5ML IV SOLN
INTRAVENOUS | Status: AC
  Filled 2014-03-02: qty 1

## 2014-03-02 MED ORDER — LACTATED RINGERS IV SOLN
INTRAVENOUS | Status: DC | PRN
  Administered 2014-03-02 (×2): via INTRAVENOUS

## 2014-03-02 MED ORDER — NEOSTIGMINE METHYLSULFATE 10 MG/10ML IV SOLN
INTRAVENOUS | Status: DC | PRN
  Administered 2014-03-02: 4 mg via INTRAVENOUS

## 2014-03-02 MED ORDER — PHENYLEPHRINE HCL 10 MG/ML IJ SOLN
INTRAMUSCULAR | Status: DC | PRN
  Administered 2014-03-02 (×2): 120 ug via INTRAVENOUS
  Administered 2014-03-02 (×2): 80 ug via INTRAVENOUS

## 2014-03-02 MED ORDER — ALBUTEROL SULFATE HFA 108 (90 BASE) MCG/ACT IN AERS
INHALATION_SPRAY | RESPIRATORY_TRACT | Status: DC | PRN
  Administered 2014-03-02 (×2): 4 via RESPIRATORY_TRACT

## 2014-03-02 MED ORDER — LIDOCAINE HCL (CARDIAC) 20 MG/ML IV SOLN
INTRAVENOUS | Status: AC
  Filled 2014-03-02: qty 5

## 2014-03-02 MED ORDER — SODIUM CHLORIDE 0.9 % IV SOLN
INTRAVENOUS | Status: DC | PRN
  Administered 2014-03-02: 17:00:00

## 2014-03-02 MED ORDER — HYDROMORPHONE HCL PF 1 MG/ML IJ SOLN
INTRAMUSCULAR | Status: AC
  Filled 2014-03-02: qty 1

## 2014-03-02 MED ORDER — LACTATED RINGERS IV SOLN
INTRAVENOUS | Status: DC
  Administered 2014-03-02 (×2): via INTRAVENOUS

## 2014-03-02 MED ORDER — PROPOFOL 10 MG/ML IV BOLUS
INTRAVENOUS | Status: DC | PRN
  Administered 2014-03-02: 170 mg via INTRAVENOUS

## 2014-03-02 MED ORDER — FENTANYL CITRATE 0.05 MG/ML IJ SOLN
INTRAMUSCULAR | Status: DC | PRN
  Administered 2014-03-02: 25 ug via INTRAVENOUS
  Administered 2014-03-02: 100 ug via INTRAVENOUS
  Administered 2014-03-02: 50 ug via INTRAVENOUS

## 2014-03-02 MED ORDER — CEFAZOLIN SODIUM-DEXTROSE 2-3 GM-% IV SOLR
2.0000 g | INTRAVENOUS | Status: AC
  Administered 2014-03-02: 2 g via INTRAVENOUS
  Filled 2014-03-02: qty 50

## 2014-03-02 MED ORDER — 0.9 % SODIUM CHLORIDE (POUR BTL) OPTIME
TOPICAL | Status: DC | PRN
  Administered 2014-03-02: 1000 mL

## 2014-03-02 MED ORDER — SODIUM CHLORIDE 0.9 % IR SOLN
Status: DC | PRN
  Administered 2014-03-02: 2000 mL

## 2014-03-02 MED ORDER — GLYCOPYRROLATE 0.2 MG/ML IJ SOLN
INTRAMUSCULAR | Status: DC | PRN
  Administered 2014-03-02: .6 mg via INTRAVENOUS

## 2014-03-02 MED ORDER — MIDAZOLAM HCL 2 MG/2ML IJ SOLN
INTRAMUSCULAR | Status: AC
  Filled 2014-03-02: qty 2

## 2014-03-02 MED ORDER — BUPIVACAINE-EPINEPHRINE 0.25% -1:200000 IJ SOLN
INTRAMUSCULAR | Status: DC | PRN
  Administered 2014-03-02: 9 mL

## 2014-03-02 MED ORDER — FENTANYL CITRATE 0.05 MG/ML IJ SOLN
INTRAMUSCULAR | Status: AC
  Filled 2014-03-02: qty 5

## 2014-03-02 MED ORDER — PROPOFOL 10 MG/ML IV BOLUS
INTRAVENOUS | Status: AC
  Filled 2014-03-02: qty 20

## 2014-03-02 SURGICAL SUPPLY — 42 items
ADH SKN CLS APL DERMABOND .7 (GAUZE/BANDAGES/DRESSINGS) ×2
APPLIER CLIP ROT 10 11.4 M/L (STAPLE) ×3
APR CLP MED LRG 11.4X10 (STAPLE) ×2
BAG SPEC RTRVL LRG 6X4 10 (ENDOMECHANICALS) ×2
BLADE SURG ROTATE 9660 (MISCELLANEOUS) IMPLANT
CANISTER SUCTION 2500CC (MISCELLANEOUS) ×3 IMPLANT
CHLORAPREP W/TINT 26ML (MISCELLANEOUS) ×3 IMPLANT
CLIP APPLIE ROT 10 11.4 M/L (STAPLE) ×2 IMPLANT
COVER MAYO STAND STRL (DRAPES) ×3 IMPLANT
COVER SURGICAL LIGHT HANDLE (MISCELLANEOUS) ×3 IMPLANT
DECANTER SPIKE VIAL GLASS SM (MISCELLANEOUS) ×6 IMPLANT
DERMABOND ADVANCED (GAUZE/BANDAGES/DRESSINGS) ×1
DERMABOND ADVANCED .7 DNX12 (GAUZE/BANDAGES/DRESSINGS) ×2 IMPLANT
DRAPE C-ARM 42X72 X-RAY (DRAPES) ×3 IMPLANT
DRAPE UTILITY 15X26 W/TAPE STR (DRAPE) ×6 IMPLANT
DRAPE WARM FLUID 44X44 (DRAPE) ×3 IMPLANT
ELECT REM PT RETURN 9FT ADLT (ELECTROSURGICAL) ×3
ELECTRODE REM PT RTRN 9FT ADLT (ELECTROSURGICAL) ×2 IMPLANT
GLOVE BIO SURGEON STRL SZ8 (GLOVE) ×3 IMPLANT
GLOVE BIOGEL PI IND STRL 8 (GLOVE) ×2 IMPLANT
GLOVE BIOGEL PI INDICATOR 8 (GLOVE) ×1
GOWN STRL REUS W/ TWL LRG LVL3 (GOWN DISPOSABLE) ×6 IMPLANT
GOWN STRL REUS W/ TWL XL LVL3 (GOWN DISPOSABLE) ×2 IMPLANT
GOWN STRL REUS W/TWL LRG LVL3 (GOWN DISPOSABLE) ×6
GOWN STRL REUS W/TWL XL LVL3 (GOWN DISPOSABLE) ×3
KIT BASIN OR (CUSTOM PROCEDURE TRAY) ×3 IMPLANT
KIT ROOM TURNOVER OR (KITS) ×3 IMPLANT
NS IRRIG 1000ML POUR BTL (IV SOLUTION) ×6 IMPLANT
PAD ARMBOARD 7.5X6 YLW CONV (MISCELLANEOUS) ×3 IMPLANT
POUCH SPECIMEN RETRIEVAL 10MM (ENDOMECHANICALS) ×3 IMPLANT
SCISSORS LAP 5X35 DISP (ENDOMECHANICALS) ×3 IMPLANT
SET CHOLANGIOGRAPH 5 50 .035 (SET/KITS/TRAYS/PACK) ×3 IMPLANT
SET IRRIG TUBING LAPAROSCOPIC (IRRIGATION / IRRIGATOR) ×3 IMPLANT
SLEEVE ENDOPATH XCEL 5M (ENDOMECHANICALS) ×3 IMPLANT
SPECIMEN JAR SMALL (MISCELLANEOUS) ×3 IMPLANT
SUT MNCRL AB 4-0 PS2 18 (SUTURE) ×3 IMPLANT
TOWEL OR 17X24 6PK STRL BLUE (TOWEL DISPOSABLE) ×3 IMPLANT
TOWEL OR 17X26 10 PK STRL BLUE (TOWEL DISPOSABLE) ×3 IMPLANT
TRAY LAPAROSCOPIC (CUSTOM PROCEDURE TRAY) ×3 IMPLANT
TROCAR XCEL BLUNT TIP 100MML (ENDOMECHANICALS) ×3 IMPLANT
TROCAR XCEL NON-BLD 11X100MML (ENDOMECHANICALS) ×3 IMPLANT
TROCAR XCEL NON-BLD 5MMX100MML (ENDOMECHANICALS) ×3 IMPLANT

## 2014-03-02 NOTE — Anesthesia Postprocedure Evaluation (Signed)
  Anesthesia Post-op Note  Patient: [name]  Procedure(s) Performed: Procedure(s): LAPAROSCOPIC CHOLECYSTECTOMY (N/A)  Patient Location: PACU  Anesthesia Type:General  Level of Consciousness: awake  Airway and Oxygen Therapy: Patient Spontanous Breathing  Post-op Pain: mild  Post-op Assessment: Post-op Vital signs reviewed  Post-op Vital Signs: Reviewed  Last Vitals:  Filed Vitals:   03/02/14 1730  BP:   Pulse: 82  Temp:   Resp: 20    Complications: No apparent anesthesia complications

## 2014-03-02 NOTE — Transfer of Care (Signed)
Immediate Anesthesia Transfer of Care Note  Patient: [name]  Procedure(s) Performed: Procedure(s): LAPAROSCOPIC CHOLECYSTECTOMY (N/A)  Patient Location: PACU  Anesthesia Type:General  Level of Consciousness: patient cooperative and responds to stimulation  Airway & Oxygen Therapy: Patient Spontanous Breathing and Patient connected to nasal cannula oxygen  Post-op Assessment: Report given to PACU RN, Post -op Vital signs reviewed and stable and Patient moving all extremities X 4  Post vital signs: Reviewed  Complications: No apparent anesthesia complications

## 2014-03-02 NOTE — Interval H&P Note (Signed)
History and Physical Interval Note:  03/02/2014 2:50 PM  [name]  has presented today for surgery, with the diagnosis of cholecysitis  The various methods of treatment have been discussed with the patient and family. After consideration of risks, benefits and other options for treatment, the patient has consented to  Procedure(s): LAPAROSCOPIC CHOLECYSTECTOMY WITH INTRAOPERATIVE CHOLANGIOGRAM (N/A) as a surgical intervention .  The patient's history has been reviewed, patient examined, no change in status, stable for surgery.  I have reviewed the patient's chart and labs.  Questions were answered to the patient's satisfaction.     Sircharles Holzheimer A.

## 2014-03-02 NOTE — Progress Notes (Signed)
Patient ID: [name], female   DOB: 08-22-1971, 43 y.o.   MRN: [id number] [facility] PROGRESS NOTE  [name] XNA:[id number] DOB: [birth year] DOA: 02/26/2014 PCP: Angelica Chessman, MD  Brief narrative: 43 y.o. female with past medical history of hypertension, obesity, diabetes with diabetic neuropathy who presented to [facility] ED 02/26/2014 with several day history of continuous substernal chest pain, 10/10 in intensity not relieved with NSAID's but did report relieved with lying on her left side. She also reported having diarrhea for past 24 hours PTA. She has received 3 nitroglycerin in ED without relief. Morphine brought her pain down to a 7/10. EKG and troponin were normal on admission. Chest x-ray negative. White blood cell count normal. Liver function tests and lipase normal.   Assessment/Plan:   Principal Problem:  Atypical chest pain  - less likely ACS; serial cardiac enzymes are WNL and 12 lead EKG showed no acute changes - per cardio, further studies are not required at this time and recommendation was to proceed with GI eval - continue aspirin daily  Abdominal pain, nausea, diarrhea  - unclear etiology; abdominal US showed hepatic steatosis - per GI, recommended eval with HIDA scan with low-normal EF with pain reproduced with CCK - appreciated GI and surgical consltation  - continued protonix 40 mg Q 12 hours - Continues with pain despite BM last night - plans for cholecystectomy Obesity  - nutrition consulted  Essential hypertension, benign  - continue Norvasc, benazepril. coreg  DM type 2 (diabetes mellitus, type 2) with complications of diabetic neuropathy  - A1c on this admission is 9.4 - appreciate DM coordinator input - restarted metformin which is pt home med, but pt has been refusing secondary to abd pain above - Will d/c regimen for now - she is also on large dose of novolog 70/30 50 units BID and currently only on Lantus 10 units with DM coordinator  recs to increase to 13 units while inpatient and resume home 70/30 insulin on d/c - neurontin for neuropathy  Stable asthma  - continue dulera   DVT prophylaxis: Lovenox sub Q while in patient   Code Status: full code  Family Communication: plan of care discussed with the patient at the bedside  Disposition Plan: home when stable   Consultants:  GI (Dr. Collene Mares)  Cardiology (Dr. Claiborne Billings) Procedures/Studies:  Abdominal US 02/28/2014  HIDA 02/28/14 - EF 33% with pain reproduced with CCK Antibiotics:  None   CHIU, Orpah Melter, MD  [facility] Pager [phone]  If 7PM-7AM, please contact night-coverage [email or web address] Password [facility] 03/02/2014, 10:24 AM   LOS: 4 days    HPI/Subjective: Reports continued abd pain this AM, not improved with BM last night  Objective: Filed Vitals:   03/02/14 0500 03/02/14 0832 03/02/14 0927 03/02/14 1010  BP: 107/55 114/79  131/86  Pulse: 78 91    Temp: 98.4 F (36.9 C)     TempSrc: Oral     Resp: 18     Height:      Weight: 241 lb 12.8 oz (109.68 kg)     SpO2: 97%  99%     Intake/Output Summary (Last 24 hours) at 03/02/14 1024 Last data filed at 03/01/14 2249  Gross per 24 hour  Intake    480 ml  Output      0 ml  Net    480 ml    Exam:   General:  Pt is not in distress  Cardiovascular: Regular rate and rhythm, S1/S2, no murmurs  Respiratory: Clear to auscultation bilaterally, no wheezing, no crackles, no rhonchi  Abdomen: tender in epigastric and RUQ abdominal area, no rebound or guarding, bowel sounds present  Extremities: No edema, pulses DP and PT palpable bilaterally  Neuro: Grossly nonfocal  Data Reviewed: Basic Metabolic Panel:  Recent Labs Lab 02/26/14 1810 03/02/14 0455  NA 141 138  K 3.7 4.0  CL 103 97  CO2 23 25  GLUCOSE 185* 171*  BUN 14 11  CREATININE 0.85 0.86  CALCIUM 9.0 9.2   Liver Function Tests:  Recent Labs Lab 02/26/14 1757 03/02/14 0455  AST 15 23  ALT 18 26  ALKPHOS 100 79   BILITOT <0.2* 0.3  PROT 6.9 6.8  ALBUMIN 3.3* 3.2*    Recent Labs Lab 02/26/14 1757 03/02/14 0455  LIPASE 32 25   No results found for this basename: AMMONIA,  in the last 168 hours CBC:  Recent Labs Lab 02/26/14 1810 03/02/14 0455  WBC 8.2 8.2  HGB 12.2 12.3  HCT 36.3 37.4  MCV 87.1 87.4  PLT 317 292   Cardiac Enzymes:  Recent Labs Lab 02/26/14 2325 02/27/14 0335 02/27/14 0930  TROPONINI <0.30 <0.30 <0.30   BNP: No components found with this basename: POCBNP,  CBG:  Recent Labs Lab 03/01/14 0722 03/01/14 1118 03/01/14 1635 03/01/14 2146 03/02/14 0725  GLUCAP 173* 235* 165* 184* 145*    No results found for this or any previous visit (from the past 240 hour(s)).   Studies: Dg Chest 2 View 02/26/2014    IMPRESSION: No active cardiopulmonary disease.     US Abdomen Complete 02/28/2014    IMPRESSION: There is again noted increased liver echogenicity consistent with hepatic steatosis. While no focal liver lesions are identified, it must be cautioned that the sensitivity of ultrasound for focal liver lesions is diminished in this circumstance. Study otherwise unremarkable.      Scheduled Meds: . amLODipine  10 mg Oral Daily  . aspirin EC  81 mg Oral Daily  . benazepril  20 mg Oral Daily  . carvedilol  3.125 mg Oral BID WC  . enoxaparin (LOVENOX)   40 mg Subcutaneous Q24H  . gabapentin  400 mg Oral TID  . insulin aspart  0-15 Units Subcutaneous TID WC  . insulin aspart  0-5 Units Subcutaneous QHS  . insulin detemir  10 Units Subcutaneous QHS  . mometasone-formoterol  2 puff Inhalation BID  . pantoprazole   40 mg Intravenous Q12H

## 2014-03-02 NOTE — H&P (View-Only) (Signed)
Subjective: Pt no better still has RUQ abdominal pain.  BM did not help pain  Objective: Vital signs in last 24 hours: Temp:  [98.4 F (36.9 C)-99.5 F (37.5 C)] 98.4 F (36.9 C) (06/11 0500) Pulse Rate:  [78-101] 91 (06/11 0832) Resp:  [18-19] 18 (06/11 0500) BP: (107-147)/(55-97) 114/79 mmHg (06/11 0832) SpO2:  [97 %-100 %] 97 % (06/11 0500) Weight:  [241 lb 12.8 oz (109.68 kg)] 241 lb 12.8 oz (109.68 kg) (06/11 0500) Last BM Date: 02/27/14  Intake/Output from previous day: 06/10 0701 - 06/11 0700 In: 480 [P.O.:480] Out: -  Intake/Output this shift:    Mild TTP RUQ   Lab Results:   Recent Labs  03/02/14 0455  WBC 8.2  HGB 12.3  HCT 37.4  PLT 292   BMET  Recent Labs  03/02/14 0455  NA 138  K 4.0  CL 97  CO2 25  GLUCOSE 171*  BUN 11  CREATININE 0.86  CALCIUM 9.2   PT/INR  Recent Labs  03/01/14 1843  LABPROT 13.0  INR 1.00   ABG No results found for this basename: PHART, PCO2, PO2, HCO3,  in the last 72 hours  Studies/Results: Nm Hepato W/eject Fract  02/28/2014   CLINICAL DATA:  43 year old female with epigastric abdominal pain. Nausea vomiting. Initial encounter.  EXAM: NUCLEAR MEDICINE HEPATOBILIARY IMAGING WITH GALLBLADDER EF  TECHNIQUE: Sequential images of the abdomen were obtained out to 60 minutes following intravenous administration of radiopharmaceutical. After slow intravenous infusion of 2 point to micrograms Cholecystokinin, gallbladder ejection fraction was determined.  RADIOPHARMACEUTICALS:  5.0 [employer] GM-01U Choletec  COMPARISON:  Abdominal ultrasound 02/28/2014. CT Abdomen and Pelvis 11/10/2013.  FINDINGS: Good radiotracer uptake by the liver and clearance of the blood pool.  Prompt gallbladder activity by 15 min. Small bowel activity beginning at 60 min.  Gallbladder ejection fraction calculated to be 33% over 30 min. At 30 min, normal ejection fraction is greater than 30%.  The patient did experience abdominal pain symptoms during  CCK infusion.  IMPRESSION: Lower limits of normal gallbladder ejection fraction. But prompt gallbladder radiotracer activity on the study arguing against chronic cholecystitis. CCK administration did produce abdominal pain symptoms.   Electronically Signed   By: Lars Pinks M.D.   On: 02/28/2014 17:17   Dg Abd Portable 1v  03/01/2014   CLINICAL DATA:  constipation  EXAM: PORTABLE ABDOMEN - 1 VIEW  COMPARISON:  11/10/2013  FINDINGS: No disproportionate dilatation of bowel. Phleboliths project over the pelvis. No obvious free intraperitoneal gas.  IMPRESSION: Nonobstructive bowel gas pattern.   Electronically Signed   By: Maryclare Bean M.D.   On: 03/01/2014 13:18    Anti-infectives: Anti-infectives   None      Assessment/Plan:  RUQ abdominal pain and possible biliary dyskinesia Discussed cholecystectomy with pt and the pro and con of surgery.  Likely hood this will help is at best 50 %.  Did have pain with CCK which is encouraging.  Risk discussed as well as other options non surgical. The procedure has been discussed with the patient. Operative and non operative treatments have been discussed. Risks of surgery include bleeding, infection,  Common bile duct injury,  Injury to the stomach,liver, colon,small intestine, abdominal wall,  Diaphragm,  Major blood vessels,  And the need for an open procedure.  Other risks include worsening of medical problems, death,  DVT and pulmonary embolism, and cardiovascular events.   Medical options have also been discussed. The patient has been informed of long term expectations  of surgery and non surgical options,  The patient agrees to proceed.     LOS: 4 days    Gloris Shiroma A. 03/02/2014

## 2014-03-02 NOTE — Anesthesia Preprocedure Evaluation (Addendum)
Anesthesia Evaluation  Patient identified by MRN, date of birth, ID band Patient awake    Reviewed: Allergy & Precautions, H&P , NPO status , Patient's Chart, lab work & pertinent test results  History of Anesthesia Complications Negative for: history of anesthetic complications  Airway Mallampati: II TM Distance: >3 FB Neck ROM: Full    Dental  (+) Teeth Intact, Dental Advisory Given   Pulmonary asthma , sleep apnea , former smoker,    + wheezing      Cardiovascular hypertension, Pt. on medications Rhythm:Regular Rate:Normal     Neuro/Psych  Headaches, Anxiety    GI/Hepatic Neg liver ROS, GERD-  Medicated and Controlled,  Endo/Other  diabetes, Type 2  Renal/GU      Musculoskeletal   Abdominal   Peds  Hematology   Anesthesia Other Findings   Reproductive/Obstetrics negative OB ROS                          Anesthesia Physical Anesthesia Plan  ASA: III  Anesthesia Plan: General   Post-op Pain Management:    Induction: Intravenous, Rapid sequence and Cricoid pressure planned  Airway Management Planned: Oral ETT  Additional Equipment:   Intra-op Plan:   Post-operative Plan: Extubation in OR  Informed Consent: I have reviewed the patients History and Physical, chart, labs and discussed the procedure including the risks, benefits and alternatives for the proposed anesthesia with the patient or authorized representative who has indicated his/her understanding and acceptance.   Dental advisory given  Plan Discussed with: CRNA, Anesthesiologist and Surgeon  Anesthesia Plan Comments:         Anesthesia Quick Evaluation

## 2014-03-02 NOTE — Consult Note (Signed)
Pt seen and examined.  Has Biliary dyskinesia at best but not sure pain related to GB.  Many other problems.  We will check in am.  Likely hood of cholecystectomy  Helping pain is 50 % at best but probably lower than that.

## 2014-03-02 NOTE — Op Note (Signed)
Laparoscopic Cholecystectomy   Indications: This patient presents with symptomatic gallbladder disease and will undergo laparoscopic cholecystectomy. Pt has signs and symptoms of biliary dyskinesia by HIDA. Discussed surgical expectations and benefit of 50 % or less from cholecystectomy.  Other options of treatment discussed.  The procedure has been discussed with the patient. Operative and non operative treatments have been discussed. Risks of surgery include bleeding, infection,  Common bile duct injury,  Injury to the stomach,liver, colon,small intestine, abdominal wall,  Diaphragm,  Major blood vessels,  And the need for an open procedure.  Other risks include worsening of medical problems, death,  DVT and pulmonary embolism, and cardiovascular events.   Medical options have also been discussed. The patient has been informed of long term expectations of surgery and non surgical options,  The patient agrees to proceed.    Pre-operative Diagnosis: biliary dyskinesia  Post-operative Diagnosis: same  Surgeon: Erroll Luna A.   Assistants: none  Anesthesia: General endotracheal anesthesia and Local anesthesia 0.25.% bupivacaine, with epinephrine  ASA Class: 2  Procedure Details  The patient was seen again in the Holding Room. The risks, benefits, complications, treatment options, and expected outcomes were discussed with the patient. The possibilities of reaction to medication, pulmonary aspiration, perforation of viscus, bleeding, recurrent infection, finding a normal gallbladder, the need for additional procedures, failure to diagnose a condition, the possible need to convert to an open procedure, and creating a complication requiring transfusion or operation were discussed with the patient. The patient and/or family concurred with the proposed plan, giving informed consent. The site of surgery properly noted/marked. The patient was taken to Operating Room, identified as [name] and the  procedure verified as Laparoscopic Cholecystectomy with Intraoperative Cholangiograms. A Time Out was held and the above information confirmed.  Prior to the induction of general anesthesia, antibiotic prophylaxis was administered. General endotracheal anesthesia was then administered and tolerated well. After the induction, the abdomen was prepped in the usual sterile fashion. The patient was positioned in the supine position with the left arm comfortably tucked, along with some reverse Trendelenburg.  Local anesthetic agent was injected into the skin near the umbilicus and an incision made. The midline fascia was incised and the Hasson technique was used to introduce a 12 mm port under direct vision. It was secured with a figure of eight Vicryl suture placed in the usual fashion. Pneumoperitoneum was then created with CO2 and tolerated well without any adverse changes in the patient's vital signs. Additional trocars were introduced under direct vision with an 11 mm trocar in the epigastrium and 2 5 mm trocars in the right upper quadrant. All skin incisions were infiltrated with a local anesthetic agent before making the incision and placing the trocars.   The gallbladder was identified, the fundus grasped and retracted cephalad. Adhesions were lysed bluntly and with the electrocautery where indicated, taking care not to injure any adjacent organs or viscus. The infundibulum was grasped and retracted laterally, exposing the peritoneum overlying the triangle of Calot. This was then divided and exposed in a blunt fashion. The cystic duct was clearly identified and bluntly dissected circumferentially. The junctions of the gallbladder, cystic duct and common bile duct were clearly identified prior to the division of any linear structure.   No cholangiogram was performed since anatomy was clear and no gallstones on U/S.   The cystic duct was then  ligated with surgical clips  on the patient side and  clipped  on the gallbladder side and divided.  The cystic artery was identified, dissected free, ligated with clips and divided as well. Posterior cystic artery clipped and divided.  The gallbladder was dissected from the liver bed in retrograde fashion with the electrocautery. The gallbladder was removed endocatch bag. The liver bed was irrigated and inspected. Hemostasis was achieved with the electrocautery. Copious irrigation was utilized and was repeatedly aspirated until clear all particulate matter. Hemostasis was achieved with no signs  Of bleeding or bile leakage.  Pneumoperitoneum was completely reduced after viewing removal of the trocars under direct vision. The wound was thoroughly irrigated and the fascia was then closed with a figure of eight suture; the skin was then closed with 4 O monocryl  and a sterile dressing was applied.  Instrument, sponge, and needle counts were correct at closure and at the conclusion of the case.   Findings: Normal appearing gallbladder   Estimated Blood Loss: Minimal                 Total IV Fluids:   500 mL         Specimens: Gallbladder           Complications: None; patient tolerated the procedure well.         Disposition: PACU - hemodynamically stable.         Condition: stable

## 2014-03-02 NOTE — Progress Notes (Signed)
Subjective: Pt no better still has RUQ abdominal pain.  BM did not help pain  Objective: Vital signs in last 24 hours: Temp:  [98.4 F (36.9 C)-99.5 F (37.5 C)] 98.4 F (36.9 C) (06/11 0500) Pulse Rate:  [78-101] 91 (06/11 0832) Resp:  [18-19] 18 (06/11 0500) BP: (107-147)/(55-97) 114/79 mmHg (06/11 0832) SpO2:  [97 %-100 %] 97 % (06/11 0500) Weight:  [241 lb 12.8 oz (109.68 kg)] 241 lb 12.8 oz (109.68 kg) (06/11 0500) Last BM Date: 02/27/14  Intake/Output from previous day: 06/10 0701 - 06/11 0700 In: 480 [P.O.:480] Out: -  Intake/Output this shift:    Mild TTP RUQ   Lab Results:   Recent Labs  03/02/14 0455  WBC 8.2  HGB 12.3  HCT 37.4  PLT 292   BMET  Recent Labs  03/02/14 0455  NA 138  K 4.0  CL 97  CO2 25  GLUCOSE 171*  BUN 11  CREATININE 0.86  CALCIUM 9.2   PT/INR  Recent Labs  03/01/14 1843  LABPROT 13.0  INR 1.00   ABG No results found for this basename: PHART, PCO2, PO2, HCO3,  in the last 72 hours  Studies/Results: Nm Hepato W/eject Fract  02/28/2014   CLINICAL DATA:  43 year old female with epigastric abdominal pain. Nausea vomiting. Initial encounter.  EXAM: NUCLEAR MEDICINE HEPATOBILIARY IMAGING WITH GALLBLADDER EF  TECHNIQUE: Sequential images of the abdomen were obtained out to 60 minutes following intravenous administration of radiopharmaceutical. After slow intravenous infusion of 2 point to micrograms Cholecystokinin, gallbladder ejection fraction was determined.  RADIOPHARMACEUTICALS:  5.0 [employer] FU-93A Choletec  COMPARISON:  Abdominal ultrasound 02/28/2014. CT Abdomen and Pelvis 11/10/2013.  FINDINGS: Good radiotracer uptake by the liver and clearance of the blood pool.  Prompt gallbladder activity by 15 min. Small bowel activity beginning at 60 min.  Gallbladder ejection fraction calculated to be 33% over 30 min. At 30 min, normal ejection fraction is greater than 30%.  The patient did experience abdominal pain symptoms during  CCK infusion.  IMPRESSION: Lower limits of normal gallbladder ejection fraction. But prompt gallbladder radiotracer activity on the study arguing against chronic cholecystitis. CCK administration did produce abdominal pain symptoms.   Electronically Signed   By: Lars Pinks M.D.   On: 02/28/2014 17:17   Dg Abd Portable 1v  03/01/2014   CLINICAL DATA:  constipation  EXAM: PORTABLE ABDOMEN - 1 VIEW  COMPARISON:  11/10/2013  FINDINGS: No disproportionate dilatation of bowel. Phleboliths project over the pelvis. No obvious free intraperitoneal gas.  IMPRESSION: Nonobstructive bowel gas pattern.   Electronically Signed   By: Maryclare Bean M.D.   On: 03/01/2014 13:18    Anti-infectives: Anti-infectives   None      Assessment/Plan:  RUQ abdominal pain and possible biliary dyskinesia Discussed cholecystectomy with pt and the pro and con of surgery.  Likely hood this will help is at best 50 %.  Did have pain with CCK which is encouraging.  Risk discussed as well as other options non surgical. The procedure has been discussed with the patient. Operative and non operative treatments have been discussed. Risks of surgery include bleeding, infection,  Common bile duct injury,  Injury to the stomach,liver, colon,small intestine, abdominal wall,  Diaphragm,  Major blood vessels,  And the need for an open procedure.  Other risks include worsening of medical problems, death,  DVT and pulmonary embolism, and cardiovascular events.   Medical options have also been discussed. The patient has been informed of long term expectations  of surgery and non surgical options,  The patient agrees to proceed.     LOS: 4 days    Danamarie Minami A. 03/02/2014

## 2014-03-03 DIAGNOSIS — I10 Essential (primary) hypertension: Secondary | ICD-10-CM

## 2014-03-03 LAB — GLUCOSE, CAPILLARY
GLUCOSE-CAPILLARY: 184 mg/dL — AB (ref 70–99)
GLUCOSE-CAPILLARY: 219 mg/dL — AB (ref 70–99)
Glucose-Capillary: 209 mg/dL — ABNORMAL HIGH (ref 70–99)
Glucose-Capillary: 199 mg/dL — ABNORMAL HIGH (ref 70–99)

## 2014-03-03 LAB — COMPREHENSIVE METABOLIC PANEL
ALT: 41 U/L — ABNORMAL HIGH (ref 0–35)
AST: 43 U/L — AB (ref 0–37)
Albumin: 3.1 g/dL — ABNORMAL LOW (ref 3.5–5.2)
Alkaline Phosphatase: 74 U/L (ref 39–117)
BILIRUBIN TOTAL: 0.3 mg/dL (ref 0.3–1.2)
BUN: 7 mg/dL (ref 6–23)
CALCIUM: 8.9 mg/dL (ref 8.4–10.5)
CHLORIDE: 100 meq/L (ref 96–112)
CO2: 24 mEq/L (ref 19–32)
Creatinine, Ser: 0.8 mg/dL (ref 0.50–1.10)
GFR calc Af Amer: >90 mL/min (ref >90)
GFR calc non Af Amer: 88 mL/min — ABNORMAL LOW (ref >90)
Glucose, Bld: 217 mg/dL — ABNORMAL HIGH (ref 70–99)
Potassium: 4.6 mEq/L (ref 3.7–5.3)
Sodium: 138 mEq/L (ref 137–147)
Total Protein: 6.7 g/dL (ref 6.0–8.3)

## 2014-03-03 MED ORDER — ENOXAPARIN SODIUM 60 MG/0.6ML ~~LOC~~ SOLN
50.0000 mg | SUBCUTANEOUS | Status: DC
  Administered 2014-03-03: 50 mg via SUBCUTANEOUS
  Filled 2014-03-03 (×2): qty 0.6

## 2014-03-03 NOTE — Progress Notes (Signed)
Pt's umbilicus surgical site had a scant amount of serous drainage.  Will continue to monitor patient for any further drainage.

## 2014-03-03 NOTE — Progress Notes (Signed)
Patient ID: EVALINE WALTMAN, female   DOB: [birth year], 43 y.o.   MRN: [id number] [facility] PROGRESS NOTE  NYLIAH NIERENBERG UUE:[id number] DOB: 10-05-1970 DOA: 02/26/2014 PCP: Angelica Chessman, MD  Brief narrative: 43 y.o. female with past medical history of hypertension, obesity, diabetes with diabetic neuropathy who presented to [facility] ED 02/26/2014 with several day history of continuous substernal chest pain, 10/10 in intensity not relieved with NSAID's but did report relieved with lying on her left side. She also reported having diarrhea for past 24 hours PTA. She has received 3 nitroglycerin in ED without relief. Morphine brought her pain down to a 7/10. EKG and troponin were normal on admission. Chest x-ray negative. White blood cell count normal. Liver function tests and lipase normal.   Assessment/Plan:   Principal Problem:  Atypical chest pain  - less likely ACS; serial cardiac enzymes are WNL and 12 lead EKG showed no acute changes - per cardio, no further studies required - continued aspirin daily  Abdominal pain, nausea, diarrhea  - unclear etiology; abdominal US showed hepatic steatosis - GI had recommended eval with HIDA scan which showed low-normal EF with pain reproduced with CCK - appreciated GI and surgical consltation  - Pt is now s/p cholecystectomy on 6/11 - continued protonix 40 mg Q 12 hours Obesity  - nutrition consulted  Essential hypertension, benign  - continued Norvasc, benazepril. coreg  DM type 2 (diabetes mellitus, type 2) with complications of diabetic neuropathy  - A1c on this admission is 9.4 - appreciate DM coordinator input - restarted metformin which is pt home med, but pt has been refusing secondary to abd pain above - Have since d/c'd regimen - she is also on large dose of novolog 70/30 50 units BID and currently only on Lantus 10 units with DM coordinator recs to increase to 13 units while inpatient and resume home 70/30 insulin on d/c -  neurontin for neuropathy  Stable asthma  - continue dulera   DVT prophylaxis: Lovenox sub Q while in patient   Code Status: full code  Family Communication: plan of care discussed with the patient at the bedside  Disposition Plan: home when stable   Consultants:  GI (Dr. Collene Mares)  Cardiology (Dr. Claiborne Billings) General Surgery Procedures/Studies:  Abdominal US 02/28/2014  HIDA 02/28/14 - EF 33% with pain reproduced with CCK Cholecystectomy 03/02/14 Antibiotics:  None   CHIU, Orpah Melter, MD  [facility] Pager [phone]  If 7PM-7AM, please contact night-coverage [email or web address] Password [email or web address] 03/03/2014, 8:00 AM   LOS: 5 days    HPI/Subjective: Continues with mild post-op abd cramping. Tolerated full liquid diet thus far.  Objective: Filed Vitals:   03/02/14 1809 03/02/14 1836 03/02/14 2026 03/03/14 0630  BP: 105/62 102/69 116/59 101/61  Pulse: 80 83 104 88  Temp: 98.6 F (37 C)  98.1 F (36.7 C) 98 F (36.7 C)  TempSrc:   Oral Oral  Resp: 18 16 18 18   Height:      Weight:    243 lb (110.224 kg)  SpO2: 100% 96% 100% 100%    Intake/Output Summary (Last 24 hours) at 03/03/14 0800 Last data filed at 03/03/14 0148  Gross per 24 hour  Intake   1000 ml  Output   1300 ml  Net   -300 ml    Exam:   General:  Pt is not in distress  Cardiovascular: Regular rate and rhythm, S1/S2, no murmurs  Respiratory: Clear to auscultation bilaterally, no wheezing, no crackles, no rhonchi  Abdomen: tender in epigastric and RUQ abdominal area, no rebound or guarding, bowel sounds present  Extremities: No edema, pulses DP and PT palpable bilaterally  Neuro: Grossly nonfocal  Data Reviewed: Basic Metabolic Panel:  Recent Labs Lab 02/26/14 1810 03/02/14 0455  NA 141 138  K 3.7 4.0  CL 103 97  CO2 23 25  GLUCOSE 185* 171*  BUN 14 11  CREATININE 0.85 0.86  CALCIUM 9.0 9.2   Liver Function Tests:  Recent Labs Lab 02/26/14 1757 03/02/14 0455  AST 15 23  ALT 18 26   ALKPHOS 100 79  BILITOT <0.2* 0.3  PROT 6.9 6.8  ALBUMIN 3.3* 3.2*    Recent Labs Lab 02/26/14 1757 03/02/14 0455  LIPASE 32 25   No results found for this basename: AMMONIA,  in the last 168 hours CBC:  Recent Labs Lab 02/26/14 1810 03/02/14 0455 03/02/14 1215  WBC 8.2 8.2 6.8  NEUTROABS  --   --  3.1  HGB 12.2 12.3 13.0  HCT 36.3 37.4 39.0  MCV 87.1 87.4 87.4  PLT 317 292 310   Cardiac Enzymes:  Recent Labs Lab 02/26/14 2325 02/27/14 0335 02/27/14 0930  TROPONINI <0.30 <0.30 <0.30   BNP: No components found with this basename: POCBNP,  CBG:  Recent Labs Lab 03/02/14 1144 03/02/14 1541 03/02/14 1703 03/02/14 2248 03/03/14 0752  GLUCAP 182* 130* 144* 171* 184*    No results found for this or any previous visit (from the past 240 hour(s)).   Studies: Dg Chest 2 View 02/26/2014    IMPRESSION: No active cardiopulmonary disease.     US Abdomen Complete 02/28/2014    IMPRESSION: There is again noted increased liver echogenicity consistent with hepatic steatosis. While no focal liver lesions are identified, it must be cautioned that the sensitivity of ultrasound for focal liver lesions is diminished in this circumstance. Study otherwise unremarkable.      Scheduled Meds: . amLODipine  10 mg Oral Daily  . aspirin EC  81 mg Oral Daily  . benazepril  20 mg Oral Daily  . carvedilol  3.125 mg Oral BID WC  . enoxaparin (LOVENOX)   40 mg Subcutaneous Q24H  . gabapentin  400 mg Oral TID  . insulin aspart  0-15 Units Subcutaneous TID WC  . insulin aspart  0-5 Units Subcutaneous QHS  . insulin detemir  10 Units Subcutaneous QHS  . mometasone-formoterol  2 puff Inhalation BID  . pantoprazole   40 mg Intravenous Q12H

## 2014-03-03 NOTE — Progress Notes (Signed)
1 Day Post-Op  Subjective: Complains of incisional pain Denies nausea Wants to try to advance her diet  Objective: Vital signs in last 24 hours: Temp:  [98 F (36.7 C)-98.6 F (37 C)] 98 F (36.7 C) (06/12 0630) Pulse Rate:  [78-104] 88 (06/12 0630) Resp:  [10-20] 18 (06/12 0630) BP: (101-131)/(56-86) 101/61 mmHg (06/12 0630) SpO2:  [95 %-100 %] 100 % (06/12 0630) Weight:  [243 lb (110.224 kg)] 243 lb (110.224 kg) (06/12 0630) Last BM Date: 03/01/14  Intake/Output from previous day: 06/11 0701 - 06/12 0700 In: 1000 [I.V.:1000] Out: 1300 [Urine:1300] Intake/Output this shift:    Abdomen soft, obese, appropriately tender  Lab Results:   Recent Labs  03/02/14 0455 03/02/14 1215  WBC 8.2 6.8  HGB 12.3 13.0  HCT 37.4 39.0  PLT 292 310   BMET  Recent Labs  03/02/14 0455  NA 138  K 4.0  CL 97  CO2 25  GLUCOSE 171*  BUN 11  CREATININE 0.86  CALCIUM 9.2   PT/INR  Recent Labs  03/01/14 1843  LABPROT 13.0  INR 1.00   ABG No results found for this basename: PHART, PCO2, PO2, HCO3,  in the last 72 hours  Studies/Results: Dg Abd Portable 1v  03/01/2014   CLINICAL DATA:  constipation  EXAM: PORTABLE ABDOMEN - 1 VIEW  COMPARISON:  11/10/2013  FINDINGS: No disproportionate dilatation of bowel. Phleboliths project over the pelvis. No obvious free intraperitoneal gas.  IMPRESSION: Nonobstructive bowel gas pattern.   Electronically Signed   By: Maryclare Bean M.D.   On: 03/01/2014 13:18    Anti-infectives: Anti-infectives   Start     Dose/Rate Route Frequency Ordered Stop   03/02/14 1130  ceFAZolin (ANCEF) IVPB 2 g/50 mL premix     2 g 100 mL/hr over 30 Minutes Intravenous On call to O.R. 03/02/14 1129 03/02/14 1555      Assessment/Plan: s/p Procedure(s): LAPAROSCOPIC CHOLECYSTECTOMY (N/A)  Stable post op Advance diet ambulate  LOS: 5 days    Noreen Mackintosh A 03/03/2014

## 2014-03-03 NOTE — Progress Notes (Signed)
1 Day Post-Op  Subjective: Having abdominal pain but nausea better.    Objective: Vital signs in last 24 hours: Temp:  [98 F (36.7 C)-98.6 F (37 C)] 98 F (36.7 C) (06/12 0630) Pulse Rate:  [78-104] 88 (06/12 0630) Resp:  [10-20] 18 (06/12 0630) BP: (101-131)/(56-86) 101/61 mmHg (06/12 0630) SpO2:  [95 %-100 %] 100 % (06/12 0630) Weight:  [243 lb (110.224 kg)] 243 lb (110.224 kg) (06/12 0630) Last BM Date: 03/01/14  Intake/Output from previous day: 06/11 0701 - 06/12 0700 In: 1000 [I.V.:1000] Out: 1300 [Urine:1300] Intake/Output this shift:    Incision/Wound:C/D/I sore obese  Lab Results:   Recent Labs  03/02/14 0455 03/02/14 1215  WBC 8.2 6.8  HGB 12.3 13.0  HCT 37.4 39.0  PLT 292 310   BMET  Recent Labs  03/02/14 0455  NA 138  K 4.0  CL 97  CO2 25  GLUCOSE 171*  BUN 11  CREATININE 0.86  CALCIUM 9.2   PT/INR  Recent Labs  03/01/14 1843  LABPROT 13.0  INR 1.00   ABG No results found for this basename: PHART, PCO2, PO2, HCO3,  in the last 72 hours  Studies/Results: Dg Abd Portable 1v  03/01/2014   CLINICAL DATA:  constipation  EXAM: PORTABLE ABDOMEN - 1 VIEW  COMPARISON:  11/10/2013  FINDINGS: No disproportionate dilatation of bowel. Phleboliths project over the pelvis. No obvious free intraperitoneal gas.  IMPRESSION: Nonobstructive bowel gas pattern.   Electronically Signed   By: Maryclare Bean M.D.   On: 03/01/2014 13:18    Anti-infectives: Anti-infectives   Start     Dose/Rate Route Frequency Ordered Stop   03/02/14 1130  ceFAZolin (ANCEF) IVPB 2 g/50 mL premix     2 g 100 mL/hr over 30 Minutes Intravenous On call to O.R. 03/02/14 1129 03/02/14 1555      Assessment/Plan: s/p Procedure(s): LAPAROSCOPIC CHOLECYSTECTOMY (N/A) Still having pain but less nausea.  I'm not optimistic this was helpful but time will tell.  Check CMET today.  Can go home once pain better  Per medicine.   LOS: 5 days    Kennadee Walthour A. 03/03/2014

## 2014-03-03 NOTE — Progress Notes (Signed)
Seen and agree  

## 2014-03-03 NOTE — Progress Notes (Signed)
1 Day Post-Op  Subjective: Still with significant RUQ and diffuse abd pain, but "doing okay." Appetite is returning. Pt states she has been OOB some and wants to ambulate more today. Denies frank N/V. No gas or BM, yet.  Objective: Vital signs in last 24 hours: Temp:  [98 F (36.7 C)-98.6 F (37 C)] 98 F (36.7 C) (06/12 0630) Pulse Rate:  [78-104] 88 (06/12 0630) Resp:  [10-20] 18 (06/12 0630) BP: (101-131)/(56-86) 101/61 mmHg (06/12 0630) SpO2:  [95 %-100 %] 100 % (06/12 0630) Weight:  [243 lb (110.224 kg)] 243 lb (110.224 kg) (06/12 0630) Last BM Date: 03/01/14  Intake/Output from previous day: 06/11 0701 - 06/12 0700 In: 1000 [I.V.:1000] Out: 1300 [Urine:1300] Intake/Output this shift:    Physical Exam: Gen: adult female in bed in NAD Cardio: RRR, no murmur appreciated Pulm: CTAB, no wheezes, on [place] O2 Abd: diffuse mild tenderness with mild distension, no peritoneal signs  BS faint but active, incisions clean / dry / dressings intact Ext: warm, well-perfused; LE SCD's in place  Lab Results:   Recent Labs  03/02/14 0455 03/02/14 1215  WBC 8.2 6.8  HGB 12.3 13.0  HCT 37.4 39.0  PLT 292 310   BMET  Recent Labs  03/02/14 0455  NA 138  K 4.0  CL 97  CO2 25  GLUCOSE 171*  BUN 11  CREATININE 0.86  CALCIUM 9.2   PT/INR  Recent Labs  03/01/14 1843  LABPROT 13.0  INR 1.00   ABG No results found for this basename: PHART, PCO2, PO2, HCO3,  in the last 72 hours  Studies/Results: Dg Abd Portable 1v  03/01/2014   CLINICAL DATA:  constipation  EXAM: PORTABLE ABDOMEN - 1 VIEW  COMPARISON:  11/10/2013  FINDINGS: No disproportionate dilatation of bowel. Phleboliths project over the pelvis. No obvious free intraperitoneal gas.  IMPRESSION: Nonobstructive bowel gas pattern.   Electronically Signed   By: Maryclare Bean M.D.   On: 03/01/2014 13:18    Anti-infectives: Anti-infectives   Start     Dose/Rate Route Frequency Ordered Stop   03/02/14 1130  ceFAZolin  (ANCEF) IVPB 2 g/50 mL premix     2 g 100 mL/hr over 30 Minutes Intravenous On call to O.R. 03/02/14 1129 03/02/14 1555      Assessment/Plan: 1. POD #1 - lap cholecystectomy for biliary dyskinesia, still with abd pain 2. Significant GERD  3. AODM insulin dependant  4. Hx of tobacco use  5. Sleep apnea non compliant with CPAP  6. Hypertension  7. Hx of asthma, with ongoing inhaler use.  8. Migraines  9. Chronic back pain  10. BMI 45.8  Plan: Advance diet to fulls this morning then to carb modified for lunch if tolerates. - mobilize as tolerated, continue pulm toilet - needs surgery outpt f/u in 2 weeks (instructions already in); discussed weight limitations - okay for D/C from surgery standpoint when pain adequately controlled and tolerating diet - otherwise management per primary team   LOS: 5 days   Please see also pending attending cosign for any edits / additions.   Emmaline Kluver, MD PGY-2, [facility] 03/03/2014, 7:50 AM

## 2014-03-04 DIAGNOSIS — J45901 Unspecified asthma with (acute) exacerbation: Secondary | ICD-10-CM

## 2014-03-04 DIAGNOSIS — J9600 Acute respiratory failure, unspecified whether with hypoxia or hypercapnia: Secondary | ICD-10-CM

## 2014-03-04 LAB — GLUCOSE, CAPILLARY
Glucose-Capillary: 219 mg/dL — ABNORMAL HIGH (ref 70–99)
Glucose-Capillary: 234 mg/dL — ABNORMAL HIGH (ref 70–99)

## 2014-03-04 MED ORDER — OXYCODONE-ACETAMINOPHEN 5-325 MG PO TABS: 1.0000 | ORAL_TABLET | ORAL | Status: DC | PRN

## 2014-03-04 NOTE — Discharge Summary (Signed)
Physician Discharge Summary  [name] WLS:[id number] DOB: June 30, 1971 DOA: 02/26/2014  PCP: Angelica Chessman, MD  Admit date: 02/26/2014 Discharge date: 03/04/2014  Time spent: 40 minutes  Recommendations for Outpatient Follow-up:  Follow up with PCP as scheduled Follow up with Dr. Brantley Stage in 2 weeks  Discharge Diagnoses:  Principal Problem:   Atypical chest pain Active Problems:   Obesity   Essential hypertension, benign   DM type 2 (diabetes mellitus, type 2)   Stable asthma   Vomiting and diarrhea   Discharge Condition: Improved  Diet recommendation: diabetic  Filed Weights   03/02/14 0500 03/03/14 0630 03/04/14 0834  Weight: 241 lb 12.8 oz (109.68 kg) 243 lb (110.224 kg) 249 lb 12.8 oz (113.309 kg)    History of present illness:  [name] is a 43 y.o. female With DM, HTN presents with a several day history of continuous substernal chest pain. Initially she thought it was heartburn and had no relief with tums. She tried nonsteroidal anti-inflammatories and tells me she took an entire bottle of Aleve over the week. The pain is pressure-like, but also "pins and needles". Worse with inspiration. Feels better when she is lying on her side. This started initially while asleep. No change with exertion. She vomited several times today. She had diarrhea all night last night. No abdominal pain. She felt hot and sweaty as well. No sick contacts, recent travel or suspect foods. She reports having had a negative stress test in [place] many years ago. She quit smoking 3 years ago. Both parents with heart disease. She has never had EGD. She does report reflux symptoms. She is not on PPI. She takes Mobic daily and recently completed a course of prednisone for asthma exacerbation. She received 3 nitroglycerin without relief. Morphine brought her pain down to a "7". EKG and troponin are normal. She is afebrile. Chest x-ray negative. White blood cell count normal. Liver function  tests and lipase normal.  Hospital Course:  Atypical chest pain  - less likely ACS; serial cardiac enzymes are WNL and 12 lead EKG showed no acute changes  - per cardio, no further Cardiac studies required  - continued aspirin daily  Abdominal pain, nausea, diarrhea  - abdominal US showed hepatic steatosis  - GI had recommended eval with HIDA scan which showed low-normal EF with pain reproduced with CCK administration - appreciated GI and surgical consltation  - Pt is now s/p cholecystectomy on 6/11 with some improvement in symptoms - Pt is clear for discharge from Surgical standpoint with close outpatient follow up Obesity  - nutrition was consulted  Essential hypertension, benign  - continued Norvasc, benazepril. coreg  DM type 2 (diabetes mellitus, type 2) with complications of diabetic neuropathy  - A1c on this admission is 9.4  - appreciate DM coordinator input  - restarted metformin which is pt home med, but pt has been refusing secondary to abd pain above  - Have since d/c'd regimen  - she is also on large dose of novolog 70/30 50 units BID and currently only on Lantus 10 units with DM coordinator recs to increase to 13 units while inpatient and resume home 70/30 insulin on d/c  - neurontin for neuropathy  Stable asthma  - continued dulera  DVT prophylaxis: Lovenox sub Q while in patient   Procedures: Abdominal US 02/28/2014  HIDA 02/28/14 - EF 33% with pain reproduced with CCK  Cholecystectomy 03/02/14  Consultations:  GI  Cardiology  General Surgery  Discharge Exam:  Filed Vitals:   03/03/14 2101 03/04/14 0523 03/04/14 0834 03/04/14 0918  BP: 102/56 132/66    Pulse: 84 76    Temp: 98.4 F (36.9 C) 98.2 F (36.8 C)    TempSrc: Oral Oral    Resp: 15 16    Height:      Weight:   249 lb 12.8 oz (113.309 kg)   SpO2: 98% 98%  99%    General: awake, in nad Cardiovascular: regular, s1, s2 Respiratory: normal resp effort, no wheezing  Discharge Instructions      Medication List    STOP taking these medications       predniSONE 10 MG tablet  Commonly known as:  DELTASONE      TAKE these medications       albuterol (2.5 MG/3ML) 0.083% nebulizer solution  Commonly known as:  PROVENTIL  Take 3 mLs (2.5 mg total) by nebulization every 6 (six) hours as needed for wheezing.     albuterol 108 (90 BASE) MCG/ACT inhaler  Commonly known as:  PROVENTIL HFA;VENTOLIN HFA  Inhale 2 puffs into the lungs every 6 (six) hours as needed for wheezing.     amLODipine 10 MG tablet  Commonly known as:  NORVASC  Take 1 tablet (10 mg total) by mouth daily.     aspirin EC 325 MG tablet  Take 1 tablet (325 mg total) by mouth daily.     benazepril-hydrochlorthiazide 20-25 MG per tablet  Commonly known as:  LOTENSIN HCT  Take 1 tablet by mouth daily.     carvedilol 3.125 MG tablet  Commonly known as:  COREG  Take 1 tablet (3.125 mg total) by mouth 2 (two) times daily with a meal.     cyclobenzaprine 5 MG tablet  Commonly known as:  FLEXERIL  Take 1 tablet (5 mg total) by mouth 3 (three) times daily as needed for muscle spasms.     Dapagliflozin Propanediol 5 MG Tabs  Take 5 mg by mouth daily.     Fluticasone-Salmeterol 250-50 MCG/DOSE Aepb  Commonly known as:  ADVAIR DISKUS  Inhale 1 puff into the lungs 2 (two) times daily.     gabapentin 400 MG capsule  Commonly known as:  NEURONTIN  Take 1 capsule (400 mg total) by mouth 3 (three) times daily.     glucose blood test strip  Use as instructed     glucose monitoring kit monitoring kit  1 each by Does not apply route 4 (four) times daily - after meals and at bedtime. 1 month Diabetic Testing Supplies for QAC-QHS accuchecks.     insulin NPH-regular Human (70-30) 100 UNIT/ML injection  Commonly known as:  RELION 70/30  Inject 50 Units into the skin 2 (two) times daily with a meal.     ipratropium 0.02 % nebulizer solution  Commonly known as:  ATROVENT  Take 2.5 mLs (0.5 mg total) by nebulization  4 (four) times daily.     meloxicam 7.5 MG tablet  Commonly known as:  MOBIC  Take 1 tablet (7.5 mg total) by mouth daily.     metFORMIN 1000 MG tablet  Commonly known as:  GLUCOPHAGE  Take 1 tablet (1,000 mg total) by mouth 2 (two) times daily with a meal.     oxyCODONE-acetaminophen 5-325 MG per tablet  Commonly known as:  PERCOCET/ROXICET  Take 1-2 tablets by mouth every 4 (four) hours as needed for severe pain.     traMADol 50 MG tablet  Commonly known as:  Veatrice Bourbon  Take 1-2 tablets (50-100 mg total) by mouth every 6 (six) hours as needed for moderate pain.       No Known Allergies Follow-up Information   Follow up with [facility] On 03/17/2014. (@ 10:00 with NP Chari Manning. )    Contact information:   [place] [address] [phone]      Follow up with CORNETT,THOMAS A., MD. Schedule an appointment as soon as possible for a visit in 2 weeks.   Specialty:  General Surgery   Contact information:   [address] [place] [address] [phone]       Follow up with Angelica Chessman, MD. Schedule an appointment as soon as possible for a visit in 1 week.   Specialty:  Internal Medicine   Contact information:   [place] [address] [phone]        The results of significant diagnostics from this hospitalization (including imaging, microbiology, ancillary and laboratory) are listed below for reference.    Significant Diagnostic Studies: Dg Chest 2 View  02/26/2014   CLINICAL DATA:  Chest pain.  Shortness of breath.  Asthma.  EXAM: CHEST  2 VIEW  COMPARISON:  11/10/13  FINDINGS: The heart size and mediastinal contours are within normal limits. Both lungs are clear. The visualized skeletal structures are unremarkable.  IMPRESSION: No active cardiopulmonary disease.   Electronically Signed   By: Earle Gell M.D.   On: 02/26/2014 19:06   US Abdomen Complete  02/28/2014   CLINICAL  DATA:  Pain with nausea and vomiting  EXAM: ULTRASOUND ABDOMEN COMPLETE  COMPARISON:  December 10, 2010  FINDINGS: Gallbladder:  No gallstones or wall thickening visualized. There is no pericholecystic fluid. No sonographic Murphy sign noted.  Common bile duct:  Diameter: 2 mm. There is no intrahepatic, common hepatic, or common bile duct dilatation.  Liver:  No focal lesion identified. Liver echogenicity is diffusely increased.  IVC:  No abnormality visualized.  Pancreas:  No mass or inflammatory focus.  Spleen:  Size and appearance within normal limits.  Right Kidney:  Length: 11.1 cm. Echogenicity within normal limits. No mass or hydronephrosis visualized.  Left Kidney:  Length: 11.0 cm. Echogenicity within normal limits. No mass or hydronephrosis visualized.  Abdominal aorta:  No aneurysm visualized.  Other findings:  No demonstrable ascites.  IMPRESSION: There is again noted increased liver echogenicity consistent with hepatic steatosis. While no focal liver lesions are identified, it must be cautioned that the sensitivity of ultrasound for focal liver lesions is diminished in this circumstance. Study otherwise unremarkable.   Electronically Signed   By: Lowella Grip M.D.   On: 02/28/2014 08:15   Nm Hepato W/eject Fract  02/28/2014   CLINICAL DATA:  43 year old female with epigastric abdominal pain. Nausea vomiting. Initial encounter.  EXAM: NUCLEAR MEDICINE HEPATOBILIARY IMAGING WITH GALLBLADDER EF  TECHNIQUE: Sequential images of the abdomen were obtained out to 60 minutes following intravenous administration of radiopharmaceutical. After slow intravenous infusion of 2 point to micrograms Cholecystokinin, gallbladder ejection fraction was determined.  RADIOPHARMACEUTICALS:  5.0 Millicurie VV-74M Choletec  COMPARISON:  Abdominal ultrasound 02/28/2014. CT Abdomen and Pelvis 11/10/2013.  FINDINGS: Good radiotracer uptake by the liver and clearance of the blood pool.  Prompt gallbladder activity by 15 min.  Small bowel activity beginning at 60 min.  Gallbladder ejection fraction calculated to be 33% over 30 min. At 30 min, normal ejection fraction is greater than 30%.  The  patient did experience abdominal pain symptoms during CCK infusion.  IMPRESSION: Lower limits of normal gallbladder ejection fraction. But prompt gallbladder radiotracer activity on the study arguing against chronic cholecystitis. CCK administration did produce abdominal pain symptoms.   Electronically Signed   By: Lars Pinks M.D.   On: 02/28/2014 17:17   Dg Abd Portable 1v  03/01/2014   CLINICAL DATA:  constipation  EXAM: PORTABLE ABDOMEN - 1 VIEW  COMPARISON:  11/10/2013  FINDINGS: No disproportionate dilatation of bowel. Phleboliths project over the pelvis. No obvious free intraperitoneal gas.  IMPRESSION: Nonobstructive bowel gas pattern.   Electronically Signed   By: Maryclare Bean M.D.   On: 03/01/2014 13:18    Microbiology: No results found for this or any previous visit (from the past 240 hour(s)).   Labs: Basic Metabolic Panel:  Recent Labs Lab 02/26/14 1810 03/02/14 0455 03/03/14 0805  NA 141 138 138  K 3.7 4.0 4.6  CL 103 97 100  CO2 _0 GLUCOSE 185* 171* 217*  BUN _1 CREATININE 0.85 0.86 0.80  CALCIUM 9.0 9.2 8.9   Liver Function Tests:  Recent Labs Lab 02/26/14 1757 03/02/14 0455 03/03/14 0805  AST 15 23 43*  ALT 18 26 41*  ALKPHOS 100 79 74  BILITOT <0.2* 0.3 0.3  PROT 6.9 6.8 6.7  ALBUMIN 3.3* 3.2* 3.1*    Recent Labs Lab 02/26/14 1757 03/02/14 0455  LIPASE 32 25   No results found for this basename: AMMONIA,  in the last 168 hours CBC:  Recent Labs Lab 02/26/14 1810 03/02/14 0455 03/02/14 1215  WBC 8.2 8.2 6.8  NEUTROABS  --   --  3.1  HGB 12.2 12.3 13.0  HCT 36.3 37.4 39.0  MCV 87.1 87.4 87.4  PLT 317 292 310   Cardiac Enzymes:  Recent Labs Lab 02/26/14 2325 02/27/14 0335 02/27/14 0930  TROPONINI <0.30 <0.30 <0.30   BNP: BNP (last 3 results)  Recent  Labs  02/26/14 1810  PROBNP 5.7   CBG:  Recent Labs Lab 03/03/14 0752 03/03/14 1153 03/03/14 1653 03/03/14 2044 03/04/14 0747  GLUCAP 184* 219* 209* 199* 219*    Signed:  Shahira Fiske K  [facility] 03/04/2014, 10:53 AM

## 2014-03-04 NOTE — Progress Notes (Signed)
2 Days Post-Op  Subjective:  The patient will today. Tolerating by mouth. No abdominal pain.  Objective: Vital signs in last 24 hours: Temp:  [98.2 F (36.8 C)-98.4 F (36.9 C)] 98.2 F (36.8 C) (06/13 0523) Pulse Rate:  [76-86] 76 (06/13 0523) Resp:  [15-20] 16 (06/13 0523) BP: (102-132)/(56-72) 132/66 mmHg (06/13 0523) SpO2:  [98 %-100 %] 98 % (06/13 0523) Last BM Date: 03/02/14  Intake/Output from previous day: 06/12 0701 - 06/13 0700 In: 1440 [P.O.:1440] Out: 900 [Urine:900] Intake/Output this shift:    General appearance: alert and cooperative Cardio: regular rate and rhythm, S1, S2 normal, no murmur, click, rub or gallop GI: soft, non-tender; bowel sounds normal; no masses,  no organomegaly  Lab Results:   Recent Labs  03/02/14 0455 03/02/14 1215  WBC 8.2 6.8  HGB 12.3 13.0  HCT 37.4 39.0  PLT 292 310   BMET  Recent Labs  03/02/14 0455 03/03/14 0805  NA 138 138  K 4.0 4.6  CL 97 100  CO2 25 24  GLUCOSE 171* 217*  BUN 11 7  CREATININE 0.86 0.80  CALCIUM 9.2 8.9   PT/INR  Recent Labs  03/01/14 1843  LABPROT 13.0  INR 1.00   ABG No results found for this basename: PHART, PCO2, PO2, HCO3,  in the last 72 hours  Studies/Results: No results found.  Anti-infectives: Anti-infectives   Start     Dose/Rate Route Frequency Ordered Stop   03/02/14 1130  ceFAZolin (ANCEF) IVPB 2 g/50 mL premix     2 g 100 mL/hr over 30 Minutes Intravenous On call to O.R. 03/02/14 1129 03/02/14 1555      Assessment/Plan: s/p Procedure(s): LAPAROSCOPIC CHOLECYSTECTOMY (N/A) OK for DC from surgery standpoint period F.u x 2 weeks with Dr. Brantley Stage  LOS: 6 days    Rosario Jacks., [facility] 03/04/2014

## 2014-03-06 ENCOUNTER — Encounter (HOSPITAL_COMMUNITY): Payer: Self-pay | Admitting: Surgery

## 2014-03-15 IMAGING — CR DG LUMBAR SPINE COMPLETE 4+V
5 series · 5 of 5 positions shown · non-contrast
Comparison: 06/21/2009

CLINICAL DATA: Chronic low back pain

EXAM:
LUMBAR SPINE - COMPLETE 4+ VIEW

[t l-spine a.p.]
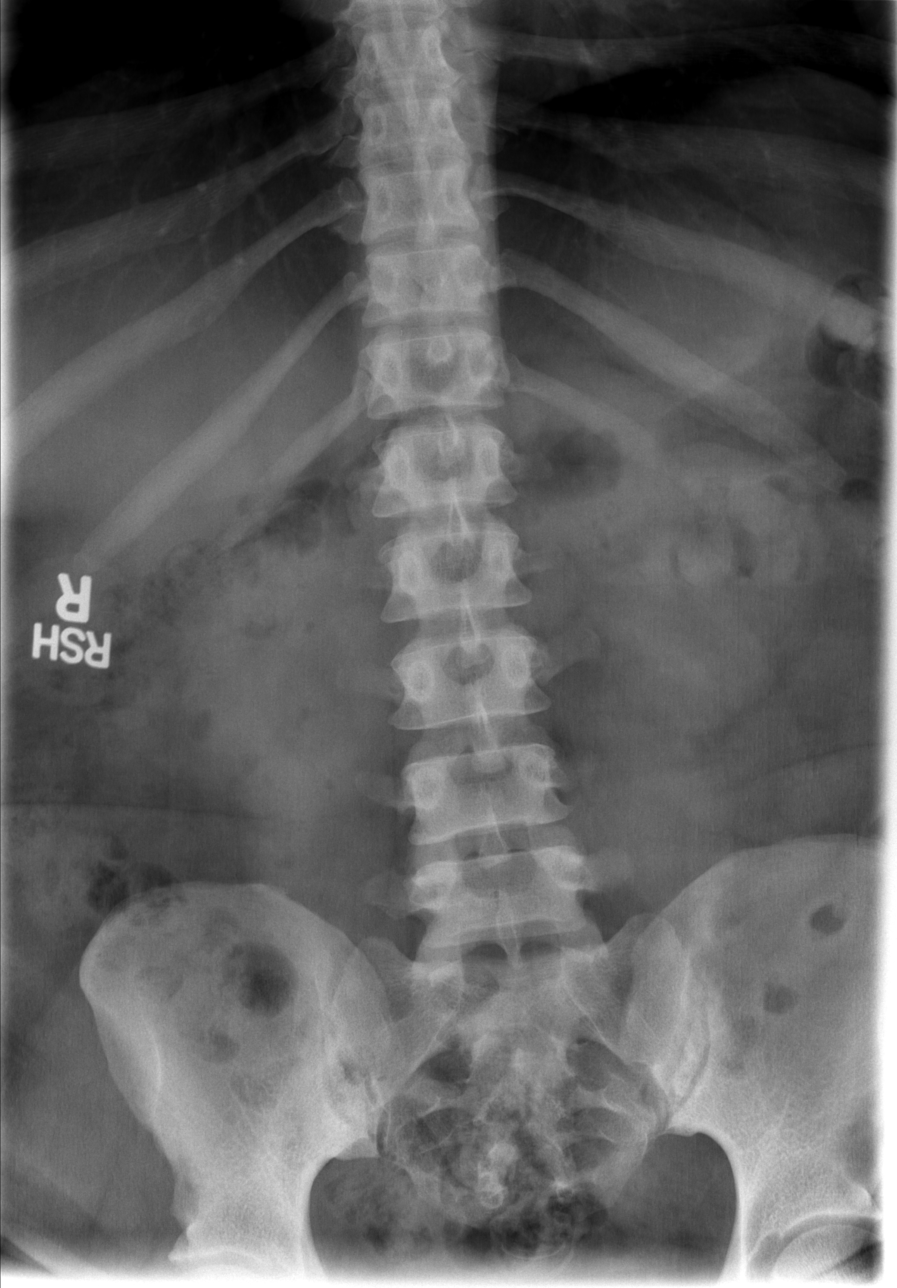

[t l-spine oblique exposure (1 of 2)]
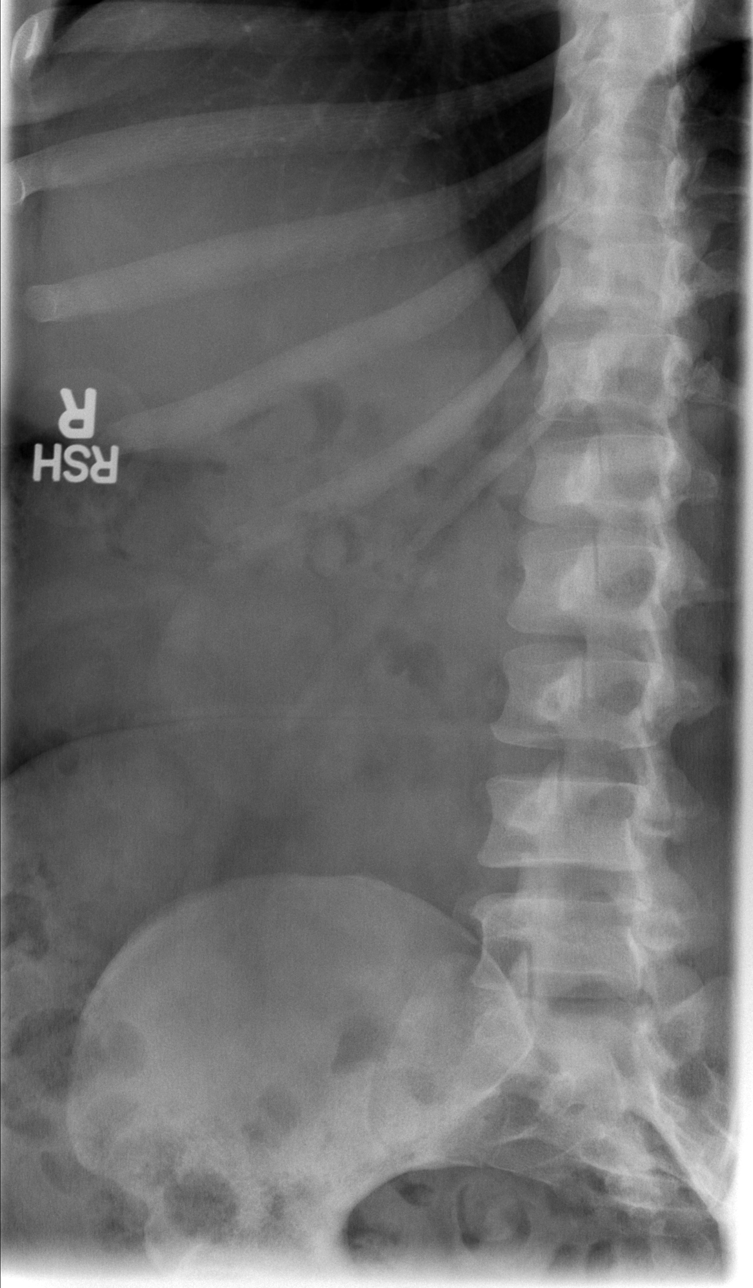

[t l-spine oblique exposure (2 of 2)]
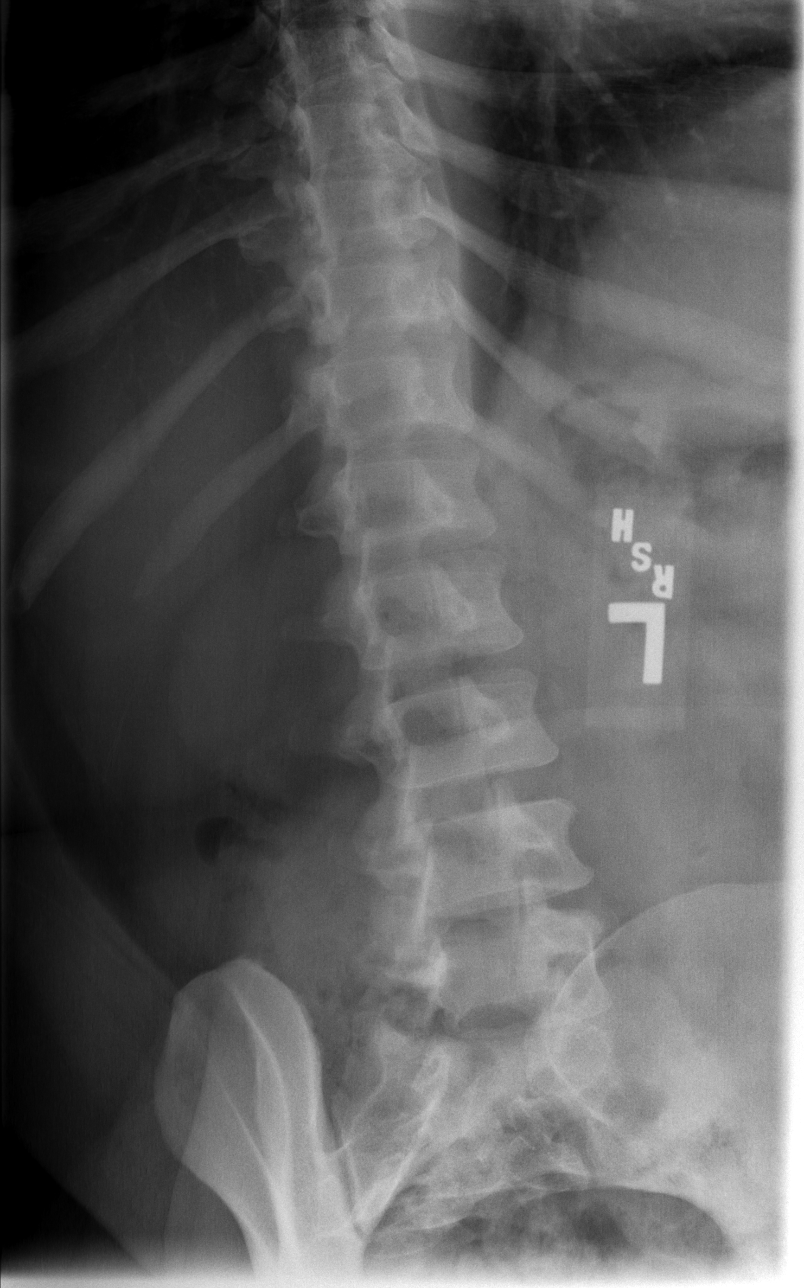

[t l-spine lat]
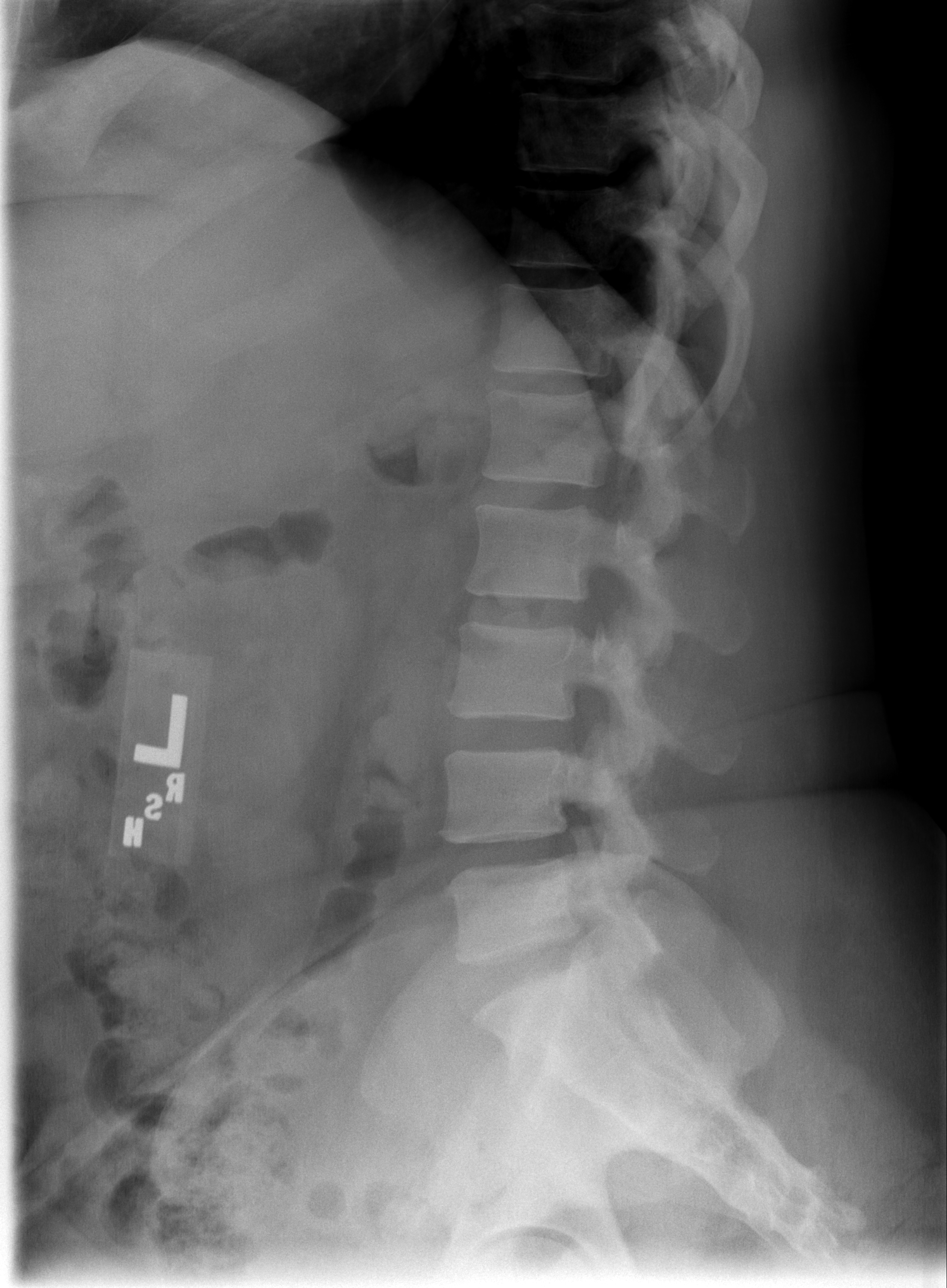

[t l-spine l5-s1 spot]
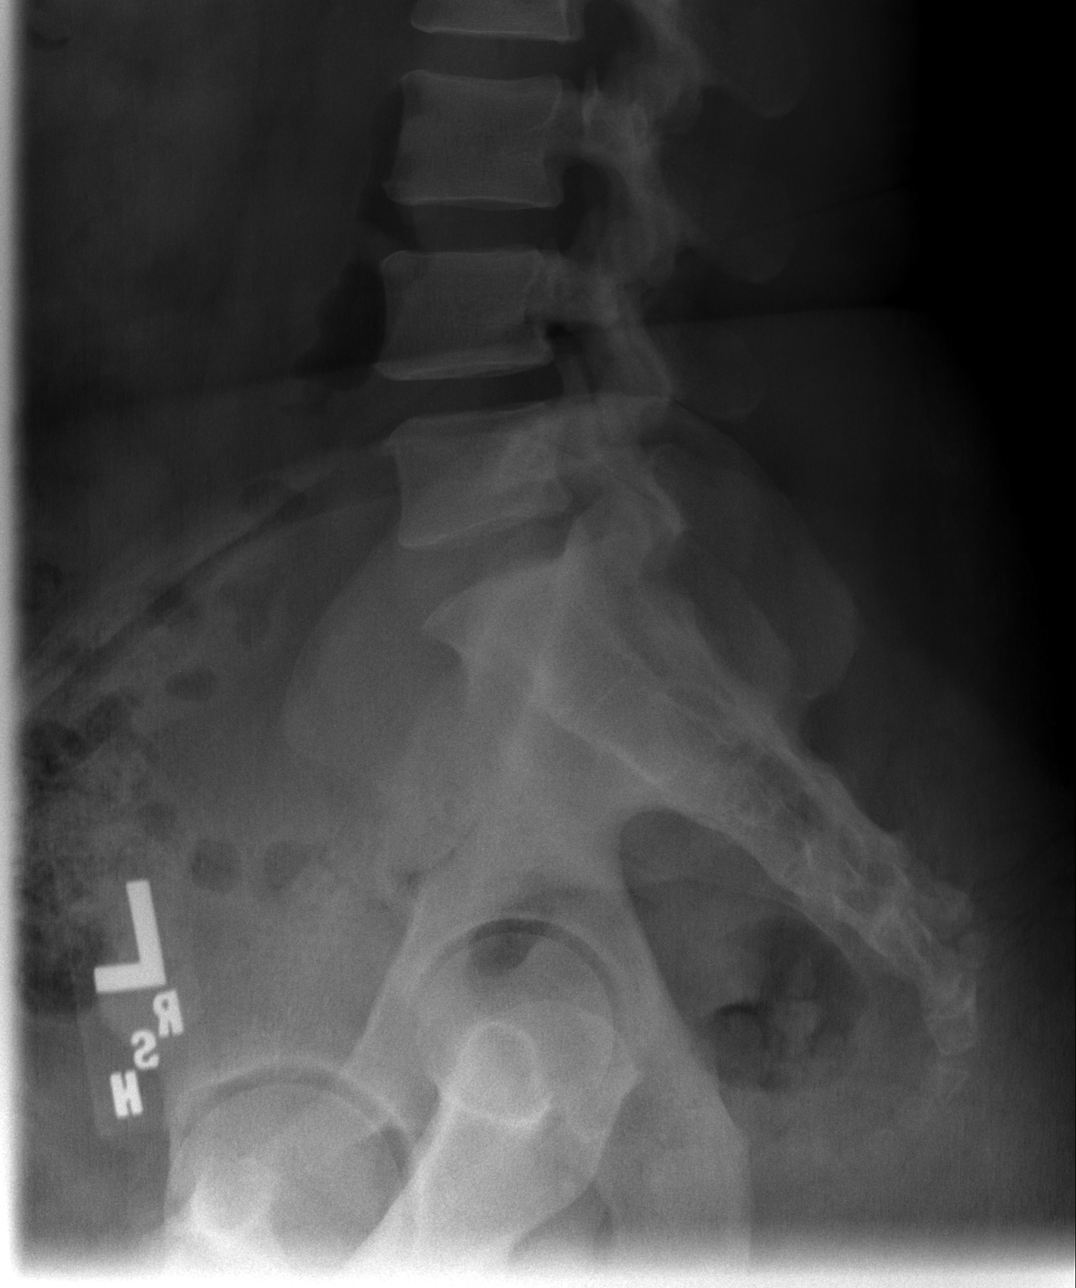

[5 of 5 positions shown; findings below may reference images not displayed]

FINDINGS: There is no evidence of lumbar spine fracture. Alignment is normal.
Intervertebral disc spaces are maintained.
IMPRESSION: No acute finding.  Stable exam.

## 2014-03-17 ENCOUNTER — Ambulatory Visit: Payer: Self-pay | Admitting: Internal Medicine

## 2014-03-21 ENCOUNTER — Encounter (INDEPENDENT_AMBULATORY_CARE_PROVIDER_SITE_OTHER): Payer: Self-pay | Admitting: General Surgery

## 2014-03-21 ENCOUNTER — Ambulatory Visit (INDEPENDENT_AMBULATORY_CARE_PROVIDER_SITE_OTHER): Payer: Self-pay | Admitting: General Surgery

## 2014-03-21 ENCOUNTER — Other Ambulatory Visit (INDEPENDENT_AMBULATORY_CARE_PROVIDER_SITE_OTHER): Payer: Self-pay | Admitting: *Deleted

## 2014-03-21 ENCOUNTER — Telehealth (INDEPENDENT_AMBULATORY_CARE_PROVIDER_SITE_OTHER): Payer: Self-pay | Admitting: *Deleted

## 2014-03-21 VITALS — BP 142/78 | HR 82 | Temp 97.0°F | Ht 61.0 in | Wt 240.0 lb

## 2014-03-21 DIAGNOSIS — R1083 Colic: Secondary | ICD-10-CM

## 2014-03-21 DIAGNOSIS — R112 Nausea with vomiting, unspecified: Secondary | ICD-10-CM

## 2014-03-21 DIAGNOSIS — Z9889 Other specified postprocedural states: Secondary | ICD-10-CM

## 2014-03-21 DIAGNOSIS — R1011 Right upper quadrant pain: Secondary | ICD-10-CM

## 2014-03-21 DIAGNOSIS — Z9049 Acquired absence of other specified parts of digestive tract: Secondary | ICD-10-CM

## 2014-03-21 NOTE — Telephone Encounter (Signed)
LMOM for pt regarding her referral to a GI.  Please advise pt that I have it sheduled for Dr. Collene Mares @ [facility] 03-30-14.  [address]. [place], [place], [place] [address].  If pt needs to reschedule the appt, their # is [phone].  Thanks!   Anderson Malta

## 2014-03-21 NOTE — Progress Notes (Signed)
[name] May 24, 1971 [id number] 03/21/2014   [name] is a 43 y.o. female who had a laparoscopic cholecystectomy by Dr. Erroll Luna.  The pathology report confirmed mild chronic cholecystitis.  The patient reports that she is continuing to have the same symptoms as before her surgery.  She had no gallstones and an EF of 33%.  She continues to have RUQ discomfort.  Her biggest complaint is crampy abdominal pain and diarrhea.  She has pain with her bowel movements.  She is still having intermittent nausea and vomiting.  No fevers or chills.  Some sweats yesterday.   Physical examination - Incisions appear well-healed with no sign of infection or bleeding.   Abdomen - soft, mild tenderness in RUQ, hypoactive BS, mild bloating Heart: regular  Impression:  s/p laparoscopic cholecystectomy  Plan:  She may resume a bland diet and full activity.  She may follow-up on a PRN basis.  She has been referred to Dr. Collene Mares who saw her in the hospital.  She continues to have the same symptoms as before her lap chole.  I do not think she has a complication from her surgery given her symptoms are no different than pre-op.  She is agreeable to return to see Dr. Collene Mares for further GI evaluation.

## 2014-03-21 NOTE — Patient Instructions (Signed)
Follow up with Dr. Collene Mares.  Our office or her office will call you with your appointment [name]

## 2014-03-22 NOTE — Telephone Encounter (Signed)
Pt returned my call.  She is made aware of her appt information below. Her appt time is 10:45.  [name]

## 2014-04-07 ENCOUNTER — Ambulatory Visit: Payer: Self-pay | Attending: Internal Medicine | Admitting: Internal Medicine

## 2014-04-07 ENCOUNTER — Encounter: Payer: Self-pay | Admitting: Internal Medicine

## 2014-04-07 VITALS — BP 160/96 | HR 98 | Temp 98.0°F | Resp 16 | Ht 61.0 in | Wt 243.4 lb

## 2014-04-07 DIAGNOSIS — E119 Type 2 diabetes mellitus without complications: Secondary | ICD-10-CM | POA: Insufficient documentation

## 2014-04-07 DIAGNOSIS — IMO0001 Reserved for inherently not codable concepts without codable children: Secondary | ICD-10-CM

## 2014-04-07 DIAGNOSIS — G43909 Migraine, unspecified, not intractable, without status migrainosus: Secondary | ICD-10-CM | POA: Insufficient documentation

## 2014-04-07 DIAGNOSIS — I10 Essential (primary) hypertension: Secondary | ICD-10-CM | POA: Insufficient documentation

## 2014-04-07 DIAGNOSIS — F32A Depression, unspecified: Secondary | ICD-10-CM

## 2014-04-07 DIAGNOSIS — IMO0002 Reserved for concepts with insufficient information to code with codable children: Secondary | ICD-10-CM

## 2014-04-07 DIAGNOSIS — F329 Major depressive disorder, single episode, unspecified: Secondary | ICD-10-CM

## 2014-04-07 DIAGNOSIS — Z1239 Encounter for other screening for malignant neoplasm of breast: Secondary | ICD-10-CM

## 2014-04-07 DIAGNOSIS — E1165 Type 2 diabetes mellitus with hyperglycemia: Secondary | ICD-10-CM

## 2014-04-07 DIAGNOSIS — M542 Cervicalgia: Secondary | ICD-10-CM | POA: Insufficient documentation

## 2014-04-07 DIAGNOSIS — F3289 Other specified depressive episodes: Secondary | ICD-10-CM | POA: Insufficient documentation

## 2014-04-07 LAB — GLUCOSE, POCT (MANUAL RESULT ENTRY)
POC GLUCOSE: 265 mg/dL — AB (ref 70–99)
POC Glucose: 252 mg/dl — AB (ref 70–99)

## 2014-04-07 MED ORDER — INSULIN GLARGINE 100 UNIT/ML ~~LOC~~ SOLN: 10.0000 [IU] | Freq: Every day | SUBCUTANEOUS | Status: DC

## 2014-04-07 MED ORDER — BENAZEPRIL-HYDROCHLOROTHIAZIDE 20-25 MG PO TABS: 1.0000 | ORAL_TABLET | Freq: Every day | ORAL | Status: DC

## 2014-04-07 MED ORDER — SERTRALINE HCL 25 MG PO TABS: 25.0000 mg | ORAL_TABLET | Freq: Every day | ORAL | Status: DC

## 2014-04-07 MED ORDER — DAPAGLIFLOZIN PROPANEDIOL 5 MG PO TABS: 5.0000 mg | ORAL_TABLET | Freq: Every day | ORAL | Status: DC

## 2014-04-07 MED ORDER — KETOROLAC TROMETHAMINE 30 MG/ML IJ SOLN
30.0000 mg | Freq: Once | INTRAMUSCULAR | Status: AC
  Administered 2014-04-07: 30 mg via INTRAMUSCULAR

## 2014-04-07 MED ORDER — SIMVASTATIN 10 MG PO TABS: 10.0000 mg | ORAL_TABLET | Freq: Every day | ORAL | Status: DC

## 2014-04-07 MED ORDER — AMLODIPINE BESYLATE 10 MG PO TABS: 10.0000 mg | ORAL_TABLET | Freq: Every day | ORAL | Status: DC

## 2014-04-07 MED ORDER — TOPIRAMATE 50 MG PO TABS: 50.0000 mg | ORAL_TABLET | Freq: Two times a day (BID) | ORAL | Status: DC

## 2014-04-07 MED ORDER — INSULIN ASPART 100 UNIT/ML ~~LOC~~ SOLN
10.0000 [IU] | Freq: Once | SUBCUTANEOUS | Status: AC
  Administered 2014-04-07: 10 [IU] via SUBCUTANEOUS

## 2014-04-07 MED ORDER — CYCLOBENZAPRINE HCL 5 MG PO TABS: 5.0000 mg | ORAL_TABLET | Freq: Three times a day (TID) | ORAL | Status: DC | PRN

## 2014-04-07 NOTE — Progress Notes (Signed)
Patient here for follow up HTN and headaches and DM Patient has been out of medication for 2 weeks Patient also needs medication refills.

## 2014-04-07 NOTE — Patient Instructions (Signed)
Begin taking lantus 10 units every night.  Discontinue Metformin. Take farxiga 5 mg daily.  Continue to check sugars three times per day before meals and at bedtime.  Bring recorded sugars back to next visit.

## 2014-04-07 NOTE — Progress Notes (Signed)
Patient ID: [name], female   DOB: June 24, 1971, 43 y.o.   MRN: [id number]  CC: follow up  HPI:  Patient reports that for the past two weeks she has been having this same headache constantly.  Patient reports that the pain begins behind her left eye and runs down the side of her face. She reports that she has been experiencing pain in mouth, ear, and down left shoulder all from the headache.  Patient reports that her vision has been blurring for quite sometime but feels like the headache makes her vision worst.  She has not had a vision exam over six years.  She has been out of antidiabetic and BP medications for over one month.  She reports that the Metformin makes her extremely sick but she has been taking her 70/30 daily.  Patient presents with her glucometer that reveals a range of 170 to 320.    No Known Allergies Past Medical History  Diagnosis Date  . Hypertension   . Hypokalemia   . Asthma   . DM (diabetes mellitus)     INSULIN DEPENDENT  . Depression   . GERD (gastroesophageal reflux disease)   . [id number](182.9)    Current Outpatient Prescriptions on File Prior to Visit  Medication Sig Dispense Refill  . albuterol (PROVENTIL HFA;VENTOLIN HFA) 108 (90 BASE) MCG/ACT inhaler Inhale 2 puffs into the lungs every 6 (six) hours as needed for wheezing.  3 Inhaler  3  . albuterol (PROVENTIL) (2.5 MG/3ML) 0.083% nebulizer solution Take 3 mLs (2.5 mg total) by nebulization every 6 (six) hours as needed for wheezing.  75 mL  11  . amLODipine (NORVASC) 10 MG tablet Take 1 tablet (10 mg total) by mouth daily.  90 tablet  3  . aspirin EC 325 MG tablet Take 1 tablet (325 mg total) by mouth daily.  30 tablet  5  . benazepril-hydrochlorthiazide (LOTENSIN HCT) 20-25 MG per tablet Take 1 tablet by mouth daily.  30 tablet  11  . carvedilol (COREG) 3.125 MG tablet Take 1 tablet (3.125 mg total) by mouth 2 (two) times daily with a meal.  60 tablet  5  . cyclobenzaprine (FLEXERIL) 5 MG tablet Take 1  tablet (5 mg total) by mouth 3 (three) times daily as needed for muscle spasms.  90 tablet  2  . Fluticasone-Salmeterol (ADVAIR DISKUS) 250-50 MCG/DOSE AEPB Inhale 1 puff into the lungs 2 (two) times daily.  3 each  3  . gabapentin (NEURONTIN) 400 MG capsule Take 1 capsule (400 mg total) by mouth 3 (three) times daily.  90 capsule  5  . glucose blood test strip Use as instructed  100 each  12  . glucose monitoring kit (FREESTYLE) monitoring kit 1 each by Does not apply route 4 (four) times daily - after meals and at bedtime. 1 month Diabetic Testing Supplies for QAC-QHS accuchecks.  1 each  1  . insulin NPH-regular Human (RELION 70/30) (70-30) 100 UNIT/ML injection Inject 50 Units into the skin 2 (two) times daily with a meal.  12 vial  2  . ipratropium (ATROVENT) 0.02 % nebulizer solution Take 2.5 mLs (0.5 mg total) by nebulization 4 (four) times daily.  75 mL  12  . meloxicam (MOBIC) 7.5 MG tablet Take 1 tablet (7.5 mg total) by mouth daily.  30 tablet  1  . metFORMIN (GLUCOPHAGE) 1000 MG tablet Take 1 tablet (1,000 mg total) by mouth 2 (two) times daily with a meal.  60 tablet  11  .  traMADol (ULTRAM) 50 MG tablet Take 1-2 tablets (50-100 mg total) by mouth every 6 (six) hours as needed for moderate pain.  60 tablet  1  . Dapagliflozin Propanediol 5 MG TABS Take 5 mg by mouth daily.  30 tablet  4   No current facility-administered medications on file prior to visit.   Family History  Problem Relation Age of Onset  . Heart attack Mother   . Stroke Mother   . Diabetes Mother   . Hypertension Mother   . Arthritis Mother   . Stroke Father   . Hypertension Sister   . Diabetes Sister   . Seizures Brother   . Diabetes Brother    History   Social History  . Marital Status: Married    Spouse Name: N/A    Number of Children: N/A  . Years of Education: N/A   Occupational History  . Not on file.   Social History Main Topics  . Smoking status: Former Smoker -- 0.30 packs/day for 22 years     Types: Cigarettes    Quit date: 05/01/2011  . Smokeless tobacco: Never Used  . Alcohol Use: Yes     Comment: rare  . Drug Use: No  . Sexual Activity: Not on file   Other Topics Concern  . Not on file   Social History Narrative  . No narrative on file    Review of Systems: See HPI   Objective:   Filed Vitals:   04/07/14 1110  BP: 160/96  Pulse: 98  Temp: 98 F (36.7 C)  Resp: 16    Physical Exam: Constitutional: Patient appears well-developed and well-nourished. No distress. HENT: Normocephalic, atraumatic, External right and left ear normal. Oropharynx is clear and moist.  Eyes: Conjunctivae and EOM are normal. PERRLA, no scleral icterus. Neck: Normal ROM. Neck supple. No JVD. No tracheal deviation. No thyromegaly. CVS: RRR, S1/S2 +, no murmurs, no gallops, no carotid bruit.  Pulmonary: Effort and breath sounds normal, no stridor, rhonchi, wheezes, rales.  Abdominal: Soft. BS +,  no distension, tenderness, rebound or guarding.  Musculoskeletal: Normal range of motion. No edema and no tenderness.  Lymphadenopathy: No lymphadenopathy noted, cervical, inguinal or axillary Neuro: Alert. Normal reflexes, muscle tone coordination. No cranial nerve deficit. Skin: Skin is warm and dry. No rash noted. Not diaphoretic. No erythema. No pallor. Psychiatric: Normal mood and affect. Behavior, judgment, thought content normal.  Lab Results  Component Value Date   WBC 6.8 03/02/2014   HGB 13.0 03/02/2014   HCT 39.0 03/02/2014   MCV 87.4 03/02/2014   PLT 310 03/02/2014   Lab Results  Component Value Date   CREATININE 0.80 03/03/2014   BUN 7 03/03/2014   NA 138 03/03/2014   K 4.6 03/03/2014   CL 100 03/03/2014   CO2 24 03/03/2014    Lab Results  Component Value Date   HGBA1C 9.4* 02/27/2014   Lipid Panel     Component Value Date/Time   CHOL 170 03/01/2014 0511   TRIG 182* 03/01/2014 0511   HDL 47 03/01/2014 0511   CHOLHDL 3.6 03/01/2014 0511   VLDL 36 03/01/2014 0511    LDLCALC 87 03/01/2014 0511       Assessment and plan:   [name] was seen today for headache and diabetes.  Diagnoses and associated orders for this visit:  Uncontrolled diabetes mellitus - Glucose (CBG) - insulin aspart (novoLOG) injection 10 Units; Inject 0.1 mLs (10 Units total) into the skin once. - Ambulatory referral to Ophthalmology -  Glucose (CBG) - simvastatin (ZOCOR) 10 MG tablet; Take 1 tablet (10 mg total) by mouth daily at 6 PM. - D/c Metformin and add Dapagliflozin Propanediol (FARXIGA) 5 MG TABS; Take 5 mg by mouth daily. - insulin glargine (LANTUS) 100 UNIT/ML injection; Inject 0.1 mLs (10 Units total) into the skin at bedtime. - Amb Referral to Nutrition and Diabetic E  Essential hypertension - benazepril-hydrochlorthiazide (LOTENSIN HCT) 20-25 MG per tablet; Take 1 tablet by mouth daily. - amLODipine (NORVASC) 10 MG tablet; Take 1 tablet (10 mg total) by mouth daily.  Neck pain  Migraine without status migrainosus, not intractable, unspecified migraine type - cyclobenzaprine (FLEXERIL) 5 MG tablet; Take 1 tablet (5 mg total) by mouth 3 (three) times daily as needed for muscle spasms. - ketorolac (TORADOL) 30 MG/ML injection 30 mg; Inject 1 mL (30 mg total) into the muscle once. - topiramate (TOPAMAX) 50 MG tablet; Take 1 tablet (50 mg total) by mouth 2 (two) times daily.  Depression - sertraline (ZOLOFT) 25 MG tablet; Take 1 tablet (25 mg total) by mouth daily.  Breast cancer screening - MM Digital Screening; Future  Return for 1 week and 3 week RN visit...2 mo PCP.       Chari Manning, [facility] [phone] 04/16/2014, 9:01 PM

## 2014-04-13 ENCOUNTER — Ambulatory Visit: Payer: Self-pay | Admitting: Dietician

## 2014-04-14 ENCOUNTER — Ambulatory Visit: Payer: Self-pay | Attending: Internal Medicine

## 2014-04-14 ENCOUNTER — Other Ambulatory Visit: Payer: Self-pay | Admitting: Internal Medicine

## 2014-04-14 VITALS — BP 123/87 | HR 66 | Temp 98.1°F | Resp 20 | Ht 61.0 in | Wt 241.0 lb

## 2014-04-14 DIAGNOSIS — R519 Headache, unspecified: Secondary | ICD-10-CM

## 2014-04-14 DIAGNOSIS — I10 Essential (primary) hypertension: Secondary | ICD-10-CM

## 2014-04-14 DIAGNOSIS — R51 Headache: Principal | ICD-10-CM

## 2014-04-14 MED ORDER — KETOROLAC TROMETHAMINE 30 MG/ML IJ SOLN
30.0000 mg | Freq: Once | INTRAMUSCULAR | Status: AC
  Administered 2014-04-14: 30 mg via INTRAMUSCULAR

## 2014-04-14 MED ORDER — SUMATRIPTAN SUCCINATE 25 MG PO TABS: 25.0000 mg | ORAL_TABLET | Freq: Four times a day (QID) | ORAL | Status: DC | PRN

## 2014-04-14 NOTE — Progress Notes (Unsigned)
Patient presented to clinic for a BP check.  BP 114/81. Patient indicates she has been taking medication as prescribed. Patient complaining of a migraine that has been on/off for about 3 weeks.  She indicates the pain intensified yesterday, and she is having left-sided pain into her eye, ear.  Patient complaining of photophobia, nausea, and has vomited twice. Spoke with Chari Manning, NP who wrote a script for Imitrex and indicated patient could be given Toradol 30 mg now for pain. Toradol administered without difficulty. Patient waiting 15 minutes post-administration.

## 2014-04-18 ENCOUNTER — Ambulatory Visit: Payer: Self-pay | Admitting: Home Health Services

## 2014-04-20 ENCOUNTER — Ambulatory Visit (INDEPENDENT_AMBULATORY_CARE_PROVIDER_SITE_OTHER): Payer: Self-pay | Admitting: Home Health Services

## 2014-04-20 DIAGNOSIS — E1310 Other specified diabetes mellitus with ketoacidosis without coma: Secondary | ICD-10-CM

## 2014-04-20 DIAGNOSIS — E1110 Type 2 diabetes mellitus with ketoacidosis without coma: Secondary | ICD-10-CM

## 2014-04-20 LAB — HM DIABETES EYE EXAM

## 2014-04-20 NOTE — Progress Notes (Signed)
DIABETES Pt came in to have a retinal scan per diabetic care.   Image was taken and submitted to [facility]-DR. Cathren Laine for reading.    Results will be available in 1-2 weeks.  Results will be given to PCP for review and to contact patient.  Vinnie Level

## 2014-04-26 ENCOUNTER — Ambulatory Visit
Admission: RE | Admit: 2014-04-26 | Discharge: 2014-04-26 | Disposition: A | Discharge status: Home / Self Care | Payer: No Typology Code available for payment source | Source: Ambulatory Visit | Attending: Internal Medicine | Admitting: Internal Medicine

## 2014-04-26 DIAGNOSIS — Z1239 Encounter for other screening for malignant neoplasm of breast: Secondary | ICD-10-CM

## 2014-04-28 ENCOUNTER — Ambulatory Visit: Payer: Self-pay | Attending: Internal Medicine

## 2014-04-30 IMAGING — MR MR LUMBAR SPINE W/O CM
4 of 5 series · 21 of 48 positions shown · non-contrast
Comparison: Lumbar spine radiographs 08/15/2013, pelvic CT
09/05/2009 and abdominal and pelvic CT 05/24/2007

CLINICAL DATA: Acute on chronic low back pain. Pain extends into
the right leg.

EXAM:
MRI LUMBAR SPINE WITHOUT CONTRAST
TECHNIQUE: Multiplanar, multisequence MR imaging was performed. No intravenous
contrast was administered.

[Series 3: T2 · sagittal · 4.0mm · 0.55mm/px · 6 of 12 slices shown (1 of 2)]
[im 1/12]
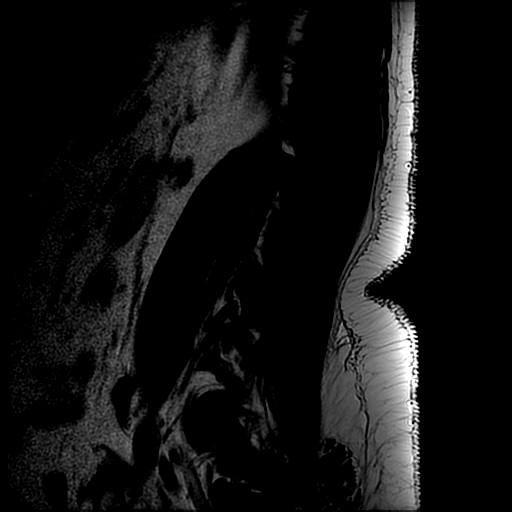
[im 3/12]
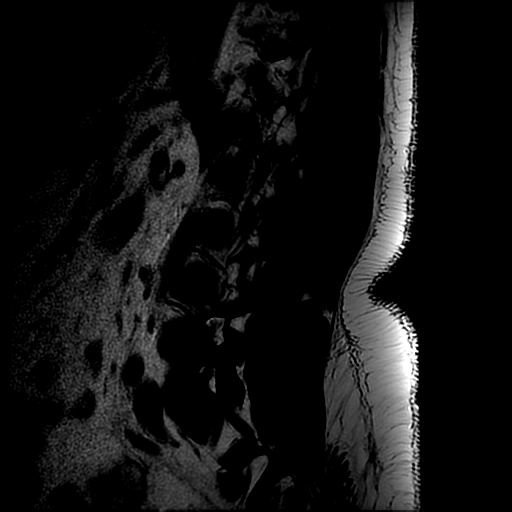
[im 5/12]
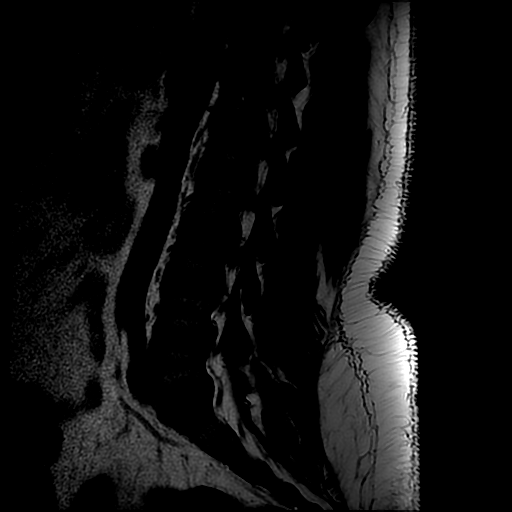
[im 7/12]
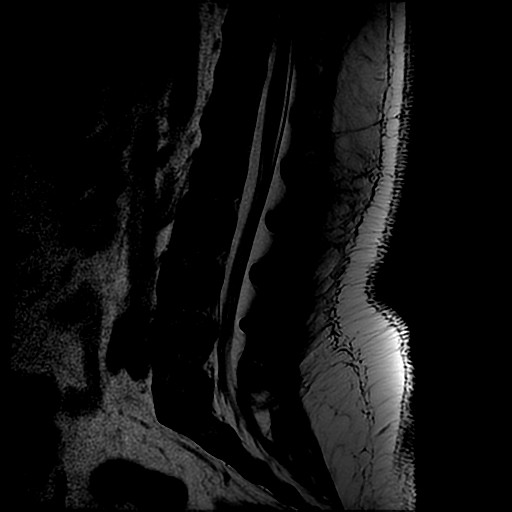
[im 9/12]
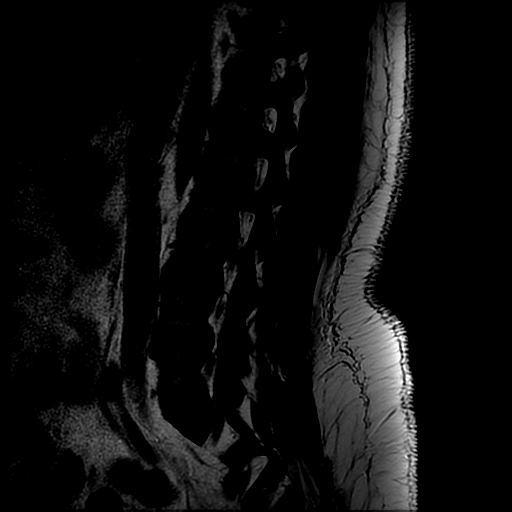
[im 12/12]
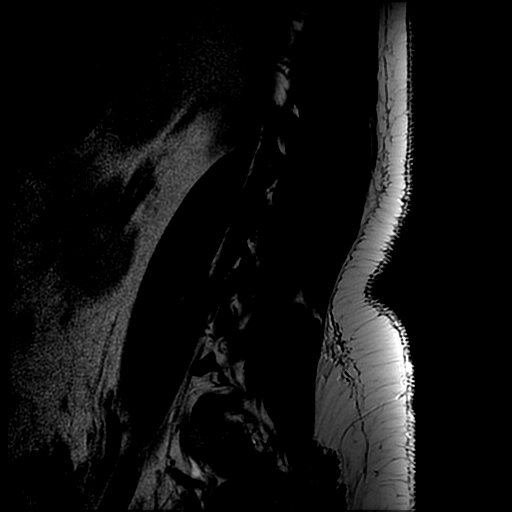

[Series 4: T1 · sagittal · 4.0mm · 0.55mm/px · 3 of 12 slices shown (1 of 2)]
[im 3/12]
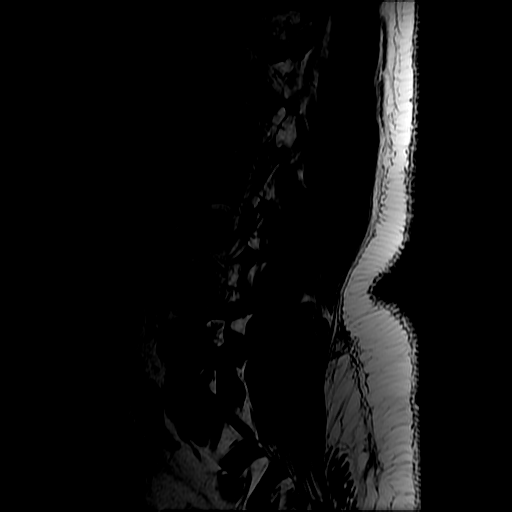
[im 7/12]
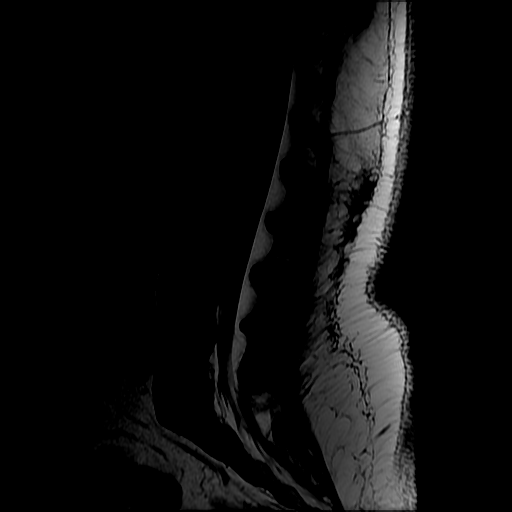
[im 12/12]
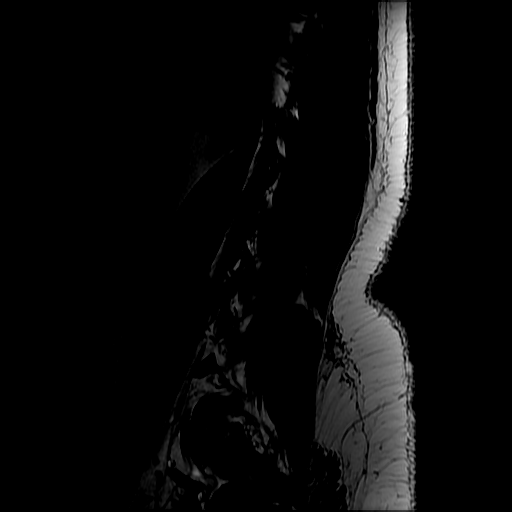

[Series 6: T2 · axial · 4.0mm · 0.39mm/px · z∈[-40,+121]mm · 9 of 30 slices shown (2 of 2)]
[im 1/30]
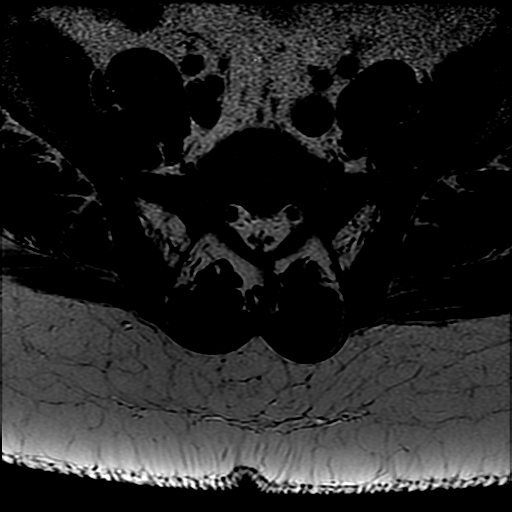
[im 5/30]
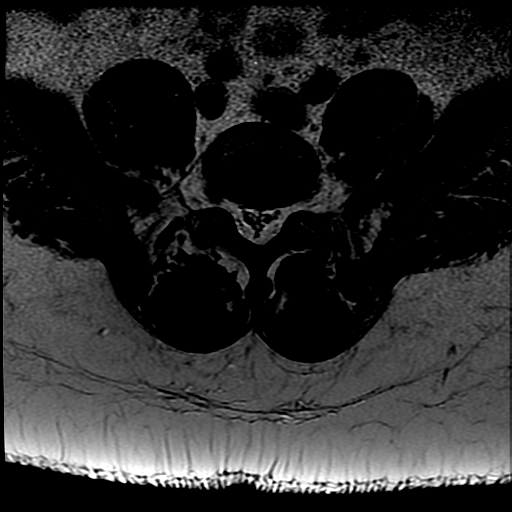
[im 9/30]
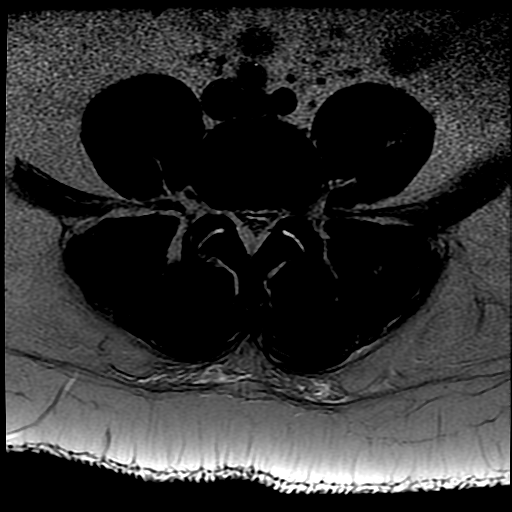
[im 13/30]
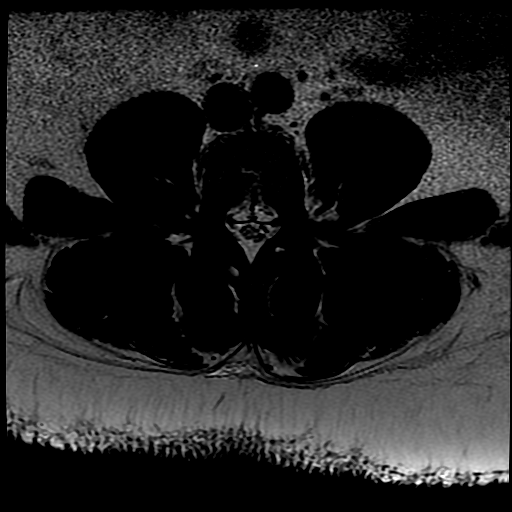
[im 15/30]
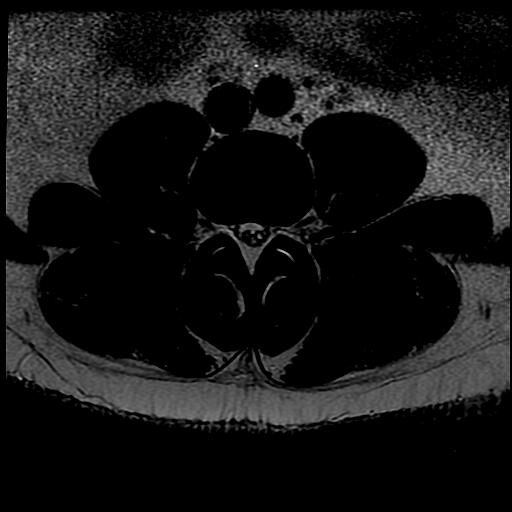
[im 17/30]
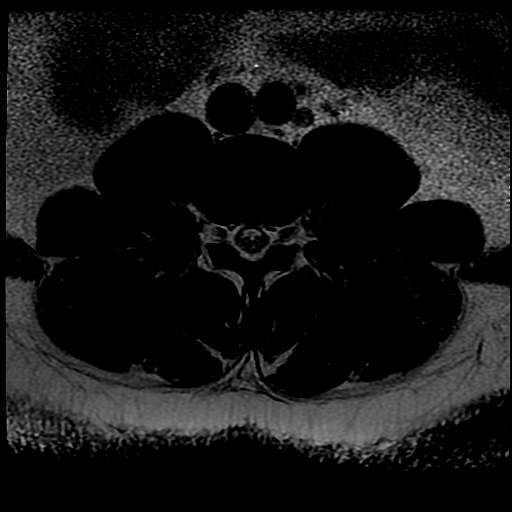
[im 21/30]
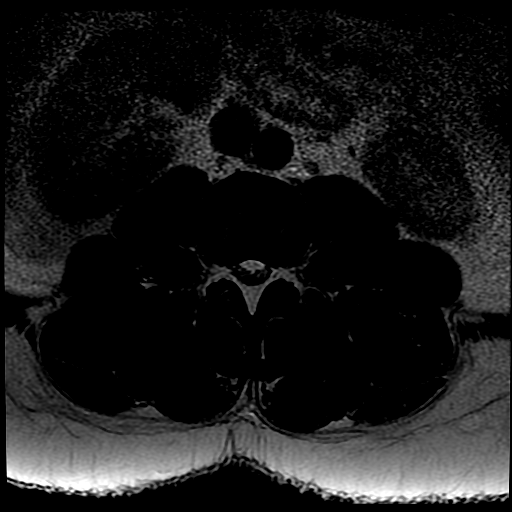
[im 25/30]
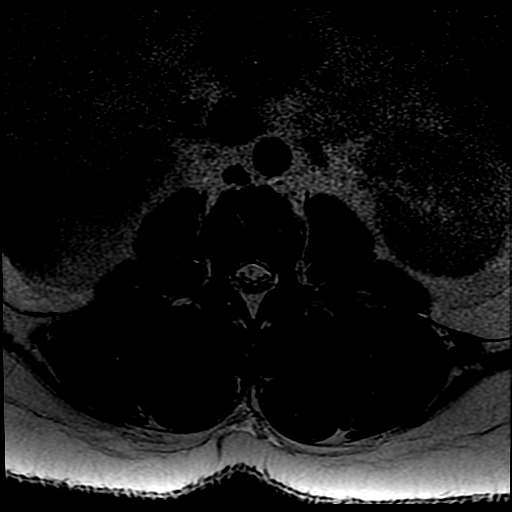
[im 30/30]
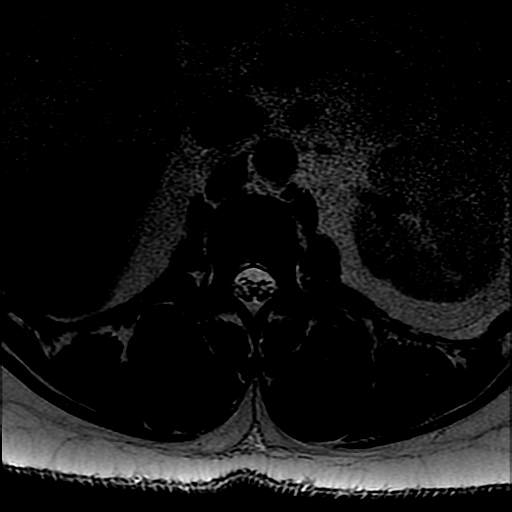

[Series 7: T1 · axial · 4.0mm · 0.78mm/px · z∈[-20,+101]mm · 3 of 29 slices shown (2 of 2)]
[im 5/29]
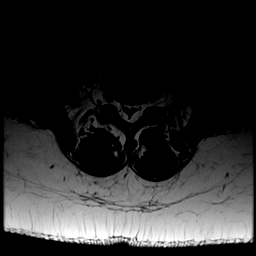
[im 15/29]
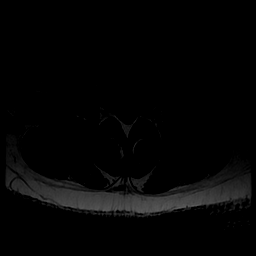
[im 25/29]
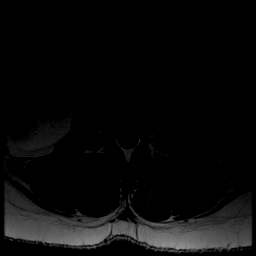

[21 of 48 positions shown; findings below may reference images not displayed]

FINDINGS: There is straightening of the normal lumbar lordosis. There is no
listhesis. Vertebral body heights are preserved. Vertebral marrow
signal is within normal limits without evidence of marrow edema.
There is mild disc space narrowing and disc desiccation at L4-5.
Other discs appear well hydrated.

There is prominent dorsal epidural fat throughout the lumbar spine.
At L4-5, there is a small central disc extrusion with slight cranial
migration. CSF in the thecal sac is mostly effaced at this level,
however this appears to be due more to the epidural fat rather than
the small disc extrusion. There is also mild bilateral facet
arthrosis at L4-5 with fluid in the joint spaces and mild right
greater than left neural foraminal narrowing at this level. At
L5-S1, there is complete effacement of CSF within the thecal sac due
to epidural fat without disc herniation or neural foraminal
stenosis. Other disc levels are unremarkable.

The conus medullaris is normal in signal and terminates at the
inferior aspect of L1. Visualized paraspinal soft tissues are
unremarkable.
IMPRESSION: Disc degeneration at L4-5 with small central disc extrusion. The
thecal sac is moderately effaced at this level, however this appears
to be more due to prominent epidural fat rather than due to the disc
material. Facet arthrosis at L4-5 contributes to mild right neural
foraminal narrowing.

## 2014-05-05 ENCOUNTER — Other Ambulatory Visit: Payer: Self-pay | Admitting: Internal Medicine

## 2014-05-05 MED ORDER — INSULIN GLARGINE 100 UNIT/ML SOLOSTAR PEN: 10.0000 [IU] | PEN_INJECTOR | Freq: Every day | SUBCUTANEOUS | Status: DC

## 2014-05-18 ENCOUNTER — Emergency Department (HOSPITAL_COMMUNITY)
Admission: EM | Admit: 2014-05-18 | Discharge: 2014-05-18 | Disposition: A | Discharge status: Home / Self Care | Payer: Self-pay | Attending: Emergency Medicine | Admitting: Emergency Medicine

## 2014-05-18 ENCOUNTER — Encounter (HOSPITAL_COMMUNITY): Payer: Self-pay | Admitting: Emergency Medicine

## 2014-05-18 DIAGNOSIS — R197 Diarrhea, unspecified: Secondary | ICD-10-CM | POA: Insufficient documentation

## 2014-05-18 DIAGNOSIS — R112 Nausea with vomiting, unspecified: Secondary | ICD-10-CM | POA: Insufficient documentation

## 2014-05-18 DIAGNOSIS — R1013 Epigastric pain: Secondary | ICD-10-CM | POA: Insufficient documentation

## 2014-05-18 DIAGNOSIS — Z79899 Other long term (current) drug therapy: Secondary | ICD-10-CM | POA: Insufficient documentation

## 2014-05-18 DIAGNOSIS — Z7982 Long term (current) use of aspirin: Secondary | ICD-10-CM | POA: Insufficient documentation

## 2014-05-18 DIAGNOSIS — J45909 Unspecified asthma, uncomplicated: Secondary | ICD-10-CM | POA: Insufficient documentation

## 2014-05-18 DIAGNOSIS — G43909 Migraine, unspecified, not intractable, without status migrainosus: Secondary | ICD-10-CM | POA: Insufficient documentation

## 2014-05-18 DIAGNOSIS — Z87891 Personal history of nicotine dependence: Secondary | ICD-10-CM | POA: Insufficient documentation

## 2014-05-18 DIAGNOSIS — Z794 Long term (current) use of insulin: Secondary | ICD-10-CM | POA: Insufficient documentation

## 2014-05-18 DIAGNOSIS — IMO0002 Reserved for concepts with insufficient information to code with codable children: Secondary | ICD-10-CM | POA: Insufficient documentation

## 2014-05-18 DIAGNOSIS — I10 Essential (primary) hypertension: Secondary | ICD-10-CM | POA: Insufficient documentation

## 2014-05-18 DIAGNOSIS — F3289 Other specified depressive episodes: Secondary | ICD-10-CM | POA: Insufficient documentation

## 2014-05-18 DIAGNOSIS — F329 Major depressive disorder, single episode, unspecified: Secondary | ICD-10-CM | POA: Insufficient documentation

## 2014-05-18 DIAGNOSIS — Z8719 Personal history of other diseases of the digestive system: Secondary | ICD-10-CM | POA: Insufficient documentation

## 2014-05-18 DIAGNOSIS — E119 Type 2 diabetes mellitus without complications: Secondary | ICD-10-CM | POA: Insufficient documentation

## 2014-05-18 LAB — CBC WITH DIFFERENTIAL/PLATELET
BASOS ABS: 0 10*3/uL (ref 0.0–0.1)
Basophils Relative: 0 % (ref 0–1)
Eosinophils Absolute: 0.1 10*3/uL (ref 0.0–0.7)
Eosinophils Relative: 1 % (ref 0–5)
HCT: 36.9 % (ref 36.0–46.0)
HEMOGLOBIN: 12.3 g/dL (ref 12.0–15.0)
LYMPHS ABS: 3.7 10*3/uL (ref 0.7–4.0)
LYMPHS PCT: 55 % — AB (ref 12–46)
MCH: 28.9 pg (ref 26.0–34.0)
MCHC: 33.3 g/dL (ref 30.0–36.0)
MCV: 86.6 fL (ref 78.0–100.0)
Monocytes Absolute: 0.4 10*3/uL (ref 0.1–1.0)
Monocytes Relative: 5 % (ref 3–12)
NEUTROS ABS: 2.7 10*3/uL (ref 1.7–7.7)
Neutrophils Relative %: 39 % — ABNORMAL LOW (ref 43–77)
Platelets: 309 10*3/uL (ref 150–400)
RBC: 4.26 MIL/uL (ref 3.87–5.11)
RDW: 14.1 % (ref 11.5–15.5)
WBC: 6.9 10*3/uL (ref 4.0–10.5)

## 2014-05-18 LAB — COMPREHENSIVE METABOLIC PANEL
ALT: 22 U/L (ref 0–35)
AST: 17 U/L (ref 0–37)
Albumin: 3.6 g/dL (ref 3.5–5.2)
Alkaline Phosphatase: 93 U/L (ref 39–117)
Anion gap: 17 — ABNORMAL HIGH (ref 5–15)
BILIRUBIN TOTAL: 0.2 mg/dL — AB (ref 0.3–1.2)
BUN: 16 mg/dL (ref 6–23)
CO2: 20 meq/L (ref 19–32)
CREATININE: 1.07 mg/dL (ref 0.50–1.10)
Calcium: 9.2 mg/dL (ref 8.4–10.5)
Chloride: 103 mEq/L (ref 96–112)
GFR, EST AFRICAN AMERICAN: 72 mL/min — AB (ref >90)
GFR, EST NON AFRICAN AMERICAN: 62 mL/min — AB (ref >90)
GLUCOSE: 215 mg/dL — AB (ref 70–99)
Potassium: 3.6 mEq/L — ABNORMAL LOW (ref 3.7–5.3)
Sodium: 140 mEq/L (ref 137–147)
Total Protein: 7.2 g/dL (ref 6.0–8.3)

## 2014-05-18 LAB — CBG MONITORING, ED: GLUCOSE-CAPILLARY: 195 mg/dL — AB (ref 70–99)

## 2014-05-18 LAB — LIPASE, BLOOD: Lipase: 28 U/L (ref 11–59)

## 2014-05-18 MED ORDER — ONDANSETRON HCL 4 MG PO TABS: 4.0000 mg | ORAL_TABLET | Freq: Three times a day (TID) | ORAL | Status: DC | PRN

## 2014-05-18 MED ORDER — SODIUM CHLORIDE 0.9 % IV BOLUS (SEPSIS)
1000.0000 mL | Freq: Once | INTRAVENOUS | Status: AC
  Administered 2014-05-18: 1000 mL via INTRAVENOUS

## 2014-05-18 MED ORDER — DIPHENHYDRAMINE HCL 50 MG/ML IJ SOLN
25.0000 mg | Freq: Once | INTRAMUSCULAR | Status: AC
  Administered 2014-05-18: 25 mg via INTRAVENOUS
  Filled 2014-05-18: qty 1

## 2014-05-18 MED ORDER — HYDROCODONE-ACETAMINOPHEN 5-325 MG PO TABS
1.0000 | ORAL_TABLET | Freq: Four times a day (QID) | ORAL | Status: DC | PRN
Start: 2014-05-18 — End: 2014-06-04

## 2014-05-18 MED ORDER — HYDROCODONE-ACETAMINOPHEN 5-325 MG PO TABS
1.0000 | ORAL_TABLET | Freq: Once | ORAL | Status: AC
  Administered 2014-05-18: 1 via ORAL
  Filled 2014-05-18: qty 1

## 2014-05-18 MED ORDER — DIPHENHYDRAMINE HCL 50 MG/ML IJ SOLN
25.0000 mg | Freq: Once | INTRAMUSCULAR | Status: AC
  Administered 2014-05-18: 25 mg via INTRAVENOUS

## 2014-05-18 MED ORDER — METOCLOPRAMIDE HCL 5 MG/ML IJ SOLN
10.0000 mg | Freq: Once | INTRAMUSCULAR | Status: AC
  Administered 2014-05-18: 10 mg via INTRAVENOUS
  Filled 2014-05-18: qty 2

## 2014-05-18 NOTE — ED Notes (Signed)
Pt placed into gown and on monitor upon arrival to room. Pt monitored by blood pressure and pulse ox. Pt asked to provide urine specimen pt stated she could not provide one at this time.

## 2014-05-18 NOTE — Discharge Instructions (Signed)

## 2014-05-18 NOTE — ED Notes (Signed)
Pt with reaction to reglan - began crying and pacing, stating she felt flushed.

## 2014-05-18 NOTE — ED Notes (Signed)
PT sleeping

## 2014-05-18 NOTE — ED Notes (Signed)
Pt reports she had her gallbladder removed two months ago. Pt reports for the last 2-3 weeks she has been vomiting, having reflux type symtoms. Severe midabdominal pain. Denies fever. States lots of diarrhea. Pt reports left sided headache. Pt awake, alert, oriented x4, moderate distress noted due to pain.

## 2014-05-18 NOTE — ED Provider Notes (Signed)
CSN: [id number]     Arrival date & time 05/18/14  1340 History   First MD Initiated Contact with Patient 05/18/14 1607     Chief Complaint  Patient presents with  . Abdominal Pain  . Emesis  . Headache     (Consider location/radiation/quality/duration/timing/severity/associated sxs/prior Treatment) [name] 43 y.o. with a pmh of DM, HTN, GERD, migraines presents with N/V, abdominal pain, and headache. S/P cholecystectomy about 1-2 months ago here for the same pain. Abdominal pain has been occurring for 3 weeks and waxes and wanes. It is located to the epigastrium and only worsened with eating. No exertional symptoms, no pleuritic symptoms. NBNB x3-4/day. No diarrhea. Nml BMs and denies blood or melena. UOP decreased as wel.. Reports on and off HA that are "just like my migraines" with the vomiting and pain. No fever. No chest pain, shortness of breath, dysuria. Has had an appendectomy.   Past Medical History  Diagnosis Date  . Hypertension   . Hypokalemia   . Asthma   . DM (diabetes mellitus)     INSULIN DEPENDENT  . Depression   . GERD (gastroesophageal reflux disease)   . UQJFHLKT(625.6)    Past Surgical History  Procedure Laterality Date  . Cesarean section      x 3  . Appendectomy    . Vesicovaginal fistula closure w/ tah  2009  . Hernia repair    . Cholecystectomy N/A 03/02/2014    Procedure: LAPAROSCOPIC CHOLECYSTECTOMY;  Surgeon: Joyice Faster. Cornett, MD;  Location: [facility];  Service: General;  Laterality: N/A;   Family History  Problem Relation Age of Onset  . Heart attack Mother   . Stroke Mother   . Diabetes Mother   . Hypertension Mother   . Arthritis Mother   . Stroke Father   . Hypertension Sister   . Diabetes Sister   . Seizures Brother   . Diabetes Brother    History  Substance Use Topics  . Smoking status: Former Smoker -- 0.30 packs/day for 22 years    Types: Cigarettes    Quit date: 05/01/2011  . Smokeless tobacco: Never Used  . Alcohol  Use: Yes     Comment: rare   OB History   Grav Para Term Preterm Abortions TAB SAB Ect Mult Living                 Review of Systems  All other systems reviewed and are negative.     Allergies  Review of patient's allergies indicates no known allergies.  Home Medications   Prior to Admission medications   Medication Sig Start Date End Date Taking? Authorizing Provider  albuterol (PROVENTIL HFA;VENTOLIN HFA) 108 (90 BASE) MCG/ACT inhaler Inhale 2 puffs into the lungs every 6 (six) hours as needed for wheezing or shortness of breath.   Yes Historical Provider, MD  albuterol (PROVENTIL) (2.5 MG/3ML) 0.083% nebulizer solution Take 2.5 mg by nebulization every 6 (six) hours as needed for wheezing or shortness of breath.   Yes Historical Provider, MD  amLODipine (NORVASC) 10 MG tablet Take 10 mg by mouth daily.   Yes Historical Provider, MD  aspirin EC 325 MG tablet Take 325 mg by mouth daily.   Yes Historical Provider, MD  benazepril-hydrochlorthiazide (LOTENSIN HCT) 20-25 MG per tablet Take 1 tablet by mouth daily.   Yes Historical Provider, MD  carvedilol (COREG) 3.125 MG tablet Take 3.125 mg by mouth 2 (two) times daily with a meal.   Yes Historical Provider,  MD  cyclobenzaprine (FLEXERIL) 5 MG tablet Take 5 mg by mouth 3 (three) times daily as needed for muscle spasms.   Yes Historical Provider, MD  Dapagliflozin Propanediol (FARXIGA) 5 MG TABS Take 5 mg by mouth daily.   Yes Historical Provider, MD  Fluticasone-Salmeterol (ADVAIR) 250-50 MCG/DOSE AEPB Inhale 1 puff into the lungs 2 (two) times daily.   Yes Historical Provider, MD  gabapentin (NEURONTIN) 400 MG capsule Take 400 mg by mouth 3 (three) times daily.   Yes Historical Provider, MD  Insulin Glargine (LANTUS SOLOSTAR) 100 UNIT/ML Solostar Pen Inject 10 Units into the skin daily at 10 pm.   Yes Historical Provider, MD  insulin NPH-regular Human (NOVOLIN 70/30) (70-30) 100 UNIT/ML injection Inject 50 Units into the skin 2 (two)  times daily with a meal.   Yes Historical Provider, MD  ipratropium (ATROVENT) 0.02 % nebulizer solution Take 0.5 mg by nebulization 4 (four) times daily.   Yes Historical Provider, MD  sertraline (ZOLOFT) 25 MG tablet Take 25 mg by mouth daily.   Yes Historical Provider, MD  simvastatin (ZOCOR) 10 MG tablet Take 10 mg by mouth daily.   Yes Historical Provider, MD  SUMAtriptan (IMITREX) 25 MG tablet Take 25 mg by mouth every 2 (two) hours as needed for migraine or headache. May repeat in 2 hours if headache persists or recurs.   Yes Historical Provider, MD  topiramate (TOPAMAX) 50 MG tablet Take 50 mg by mouth 2 (two) times daily.   Yes Historical Provider, MD  hydrochlorothiazide (HYDRODIURIL) 12.5 MG tablet Take 12.5 mg by mouth daily.    Historical Provider, MD  oxyCODONE-acetaminophen (PERCOCET/ROXICET) 5-325 MG per tablet Take 1-2 tablets by mouth every 4 (four) hours as needed for severe pain.    Historical Provider, MD   BP 139/97  Pulse 99  Temp(Src) 98.3 F (36.8 C) (Oral)  Resp 20  Ht 5\' 1"  (1.549 m)  Wt 230 lb (104.327 kg)  BMI 43.48 kg/m2  SpO2 99% Physical Exam  Constitutional: She is oriented to person, place, and time. She appears well-developed and well-nourished. She appears distressed (appears to be in pain).  HENT:  Head: Normocephalic and atraumatic.  Right Ear: External ear normal.  Left Ear: External ear normal.  Eyes: Conjunctivae and EOM are normal. Right eye exhibits no discharge. Left eye exhibits no discharge.  Neck: Normal range of motion. Neck supple. No JVD present.  Cardiovascular: Normal rate, regular rhythm and normal heart sounds.  Exam reveals no gallop and no friction rub.   No murmur heard. Pulmonary/Chest: Effort normal and breath sounds normal. No stridor. No respiratory distress. She has no wheezes. She has no rales. She exhibits no tenderness.  Abdominal: Soft. Bowel sounds are normal. She exhibits no distension. There is no hepatosplenomegaly.  There is tenderness (slighty) in the epigastric area. There is no rebound, no guarding and no CVA tenderness.  Musculoskeletal: Normal range of motion. She exhibits no edema.  Neurological: She is alert and oriented to person, place, and time.  Skin: Skin is warm. No rash noted. She is not diaphoretic.  Psychiatric: She has a normal mood and affect. Her behavior is normal.    ED Course  Procedures (including critical care time) Labs Review Labs Reviewed  CBC WITH DIFFERENTIAL - Abnormal; Notable for the following:    Neutrophils Relative % 39 (*)    Lymphocytes Relative 55 (*)    All other components within normal limits  CBG MONITORING, ED - Abnormal; Notable for the  following:    Glucose-Capillary 195 (*)    All other components within normal limits  COMPREHENSIVE METABOLIC PANEL  URINALYSIS, ROUTINE W REFLEX MICROSCOPIC  LIPASE, BLOOD    Imaging Review No results found.   EKG Interpretation None      MDM   Final diagnoses:  None    Pt presents with abd pain, N/V/D. No hematemesis, no melena no blood in stool. AF. Normotensive. HDS. No peritonitis on exam. No CVA ttp. No urinary symptoms. Doubt UTI or pyelo clinically. Slight epigastric ttp on exam. Given her extensive history of DM, recent cholecystectomy and her ongoing clinical picture gastroparesis vs chronic pain vs gastritis. He was also complaining of a migraine, it was of gradual onset and just like migraines previously. Gave reglan and benadryl and she developed akathisias. Gave another dose of benadryl with resolution of her symptoms. Gave a dose of norco in the ED. Her symptoms resolved. Lipase neg. LFTS wnls. No leukocytosis. Repeat abdominal exam continued to be benign. She requested to be dc's home which is reasonable. Gave rx for norco and zofran for her symptoms. Will need to call for an appt with GI for likely EGD. Already on PP and will continue. Strong return precautions given for worsening symptoms or any  other alarming or concerning symptoms or issues. The patient was in agreement with the treatment plan and I answered all of their questions. The patient was stable for dc. At dc, the patient ambulated without difficulty, was moving all four extremities, symptoms improved, NAD. and AOx4 Care discussed with my attending, Dr. Jeneen Rinks. If performed and available, imaging studies and labs reviewed.    Kelby Aline, MD 05/19/14 [phone]

## 2014-05-25 NOTE — ED Provider Notes (Signed)
I saw and evaluated the patient, reviewed the resident's note and I agree with the findings and plan.   EKG Interpretation None      Pt seen and examined.  D/W Dr.Lozier.  I agree with his evaluation and disposition.  Pt with soft, non peritoneal abdomen on exam.  C/O similar pain before and after chole.  Agree with disposition.  Tanna Furry, MD 05/25/14 2118

## 2014-06-04 ENCOUNTER — Encounter (HOSPITAL_COMMUNITY): Payer: Self-pay | Admitting: Emergency Medicine

## 2014-06-04 ENCOUNTER — Emergency Department (HOSPITAL_COMMUNITY): Payer: Self-pay

## 2014-06-04 ENCOUNTER — Observation Stay (HOSPITAL_COMMUNITY)
Admission: EM | Admit: 2014-06-04 | Discharge: 2014-06-06 | Disposition: A | Discharge status: Home / Self Care | Payer: Self-pay | Attending: Internal Medicine | Admitting: Internal Medicine

## 2014-06-04 DIAGNOSIS — Z23 Encounter for immunization: Secondary | ICD-10-CM | POA: Insufficient documentation

## 2014-06-04 DIAGNOSIS — M549 Dorsalgia, unspecified: Secondary | ICD-10-CM

## 2014-06-04 DIAGNOSIS — J069 Acute upper respiratory infection, unspecified: Secondary | ICD-10-CM | POA: Insufficient documentation

## 2014-06-04 DIAGNOSIS — E669 Obesity, unspecified: Secondary | ICD-10-CM

## 2014-06-04 DIAGNOSIS — Z7982 Long term (current) use of aspirin: Secondary | ICD-10-CM | POA: Insufficient documentation

## 2014-06-04 DIAGNOSIS — R071 Chest pain on breathing: Secondary | ICD-10-CM

## 2014-06-04 DIAGNOSIS — E1165 Type 2 diabetes mellitus with hyperglycemia: Secondary | ICD-10-CM

## 2014-06-04 DIAGNOSIS — M545 Low back pain: Secondary | ICD-10-CM

## 2014-06-04 DIAGNOSIS — E876 Hypokalemia: Secondary | ICD-10-CM | POA: Insufficient documentation

## 2014-06-04 DIAGNOSIS — Z87891 Personal history of nicotine dependence: Secondary | ICD-10-CM | POA: Insufficient documentation

## 2014-06-04 DIAGNOSIS — E118 Type 2 diabetes mellitus with unspecified complications: Secondary | ICD-10-CM

## 2014-06-04 DIAGNOSIS — Z794 Long term (current) use of insulin: Secondary | ICD-10-CM | POA: Insufficient documentation

## 2014-06-04 DIAGNOSIS — J45901 Unspecified asthma with (acute) exacerbation: Principal | ICD-10-CM

## 2014-06-04 DIAGNOSIS — R9431 Abnormal electrocardiogram [ECG] [EKG]: Secondary | ICD-10-CM

## 2014-06-04 DIAGNOSIS — G8929 Other chronic pain: Secondary | ICD-10-CM

## 2014-06-04 DIAGNOSIS — F329 Major depressive disorder, single episode, unspecified: Secondary | ICD-10-CM | POA: Insufficient documentation

## 2014-06-04 DIAGNOSIS — F3289 Other specified depressive episodes: Secondary | ICD-10-CM | POA: Insufficient documentation

## 2014-06-04 DIAGNOSIS — R079 Chest pain, unspecified: Secondary | ICD-10-CM | POA: Insufficient documentation

## 2014-06-04 DIAGNOSIS — E785 Hyperlipidemia, unspecified: Secondary | ICD-10-CM

## 2014-06-04 DIAGNOSIS — Z9089 Acquired absence of other organs: Secondary | ICD-10-CM | POA: Insufficient documentation

## 2014-06-04 DIAGNOSIS — K219 Gastro-esophageal reflux disease without esophagitis: Secondary | ICD-10-CM | POA: Insufficient documentation

## 2014-06-04 DIAGNOSIS — E119 Type 2 diabetes mellitus without complications: Secondary | ICD-10-CM

## 2014-06-04 DIAGNOSIS — I10 Essential (primary) hypertension: Secondary | ICD-10-CM

## 2014-06-04 HISTORY — DX: Dorsalgia, unspecified: M54.9

## 2014-06-04 HISTORY — DX: Other chronic pain: G89.29

## 2014-06-04 HISTORY — DX: Chest pain, unspecified: R07.9

## 2014-06-04 LAB — GLUCOSE, CAPILLARY
GLUCOSE-CAPILLARY: 388 mg/dL — AB (ref 70–99)
Glucose-Capillary: 386 mg/dL — ABNORMAL HIGH (ref 70–99)

## 2014-06-04 LAB — CBC
HEMATOCRIT: 34.5 % — AB (ref 36.0–46.0)
HEMOGLOBIN: 11.8 g/dL — AB (ref 12.0–15.0)
MCH: 29.2 pg (ref 26.0–34.0)
MCHC: 34.2 g/dL (ref 30.0–36.0)
MCV: 85.4 fL (ref 78.0–100.0)
Platelets: 278 10*3/uL (ref 150–400)
RBC: 4.04 MIL/uL (ref 3.87–5.11)
RDW: 14.3 % (ref 11.5–15.5)
WBC: 10.3 10*3/uL (ref 4.0–10.5)

## 2014-06-04 LAB — BASIC METABOLIC PANEL
Anion gap: 27 — ABNORMAL HIGH (ref 5–15)
BUN: 9 mg/dL (ref 6–23)
CO2: 11 mEq/L — ABNORMAL LOW (ref 19–32)
Calcium: 9.1 mg/dL (ref 8.4–10.5)
Chloride: 96 mEq/L (ref 96–112)
Creatinine, Ser: 0.86 mg/dL (ref 0.50–1.10)
GFR, EST NON AFRICAN AMERICAN: 81 mL/min — AB (ref >90)
Glucose, Bld: 351 mg/dL — ABNORMAL HIGH (ref 70–99)
POTASSIUM: 4 meq/L (ref 3.7–5.3)
Sodium: 134 mEq/L — ABNORMAL LOW (ref 137–147)

## 2014-06-04 LAB — RAPID STREP SCREEN (MED CTR MEBANE ONLY): Streptococcus, Group A Screen (Direct): NEGATIVE

## 2014-06-04 LAB — I-STAT CHEM 8, ED
BUN: 6 mg/dL (ref 6–23)
CREATININE: 0.8 mg/dL (ref 0.50–1.10)
Calcium, Ion: 1.15 mmol/L (ref 1.12–1.23)
Chloride: 105 mEq/L (ref 96–112)
GLUCOSE: 207 mg/dL — AB (ref 70–99)
HCT: 37 % (ref 36.0–46.0)
HEMOGLOBIN: 12.6 g/dL (ref 12.0–15.0)
Potassium: 2.6 mEq/L — CL (ref 3.7–5.3)
Sodium: 140 mEq/L (ref 137–147)
TCO2: 19 mmol/L (ref 0–100)

## 2014-06-04 LAB — I-STAT TROPONIN, ED: Troponin i, poc: 0 ng/mL (ref 0.00–0.08)

## 2014-06-04 LAB — MAGNESIUM: Magnesium: 1.5 mg/dL (ref 1.5–2.5)

## 2014-06-04 MED ORDER — INSULIN ASPART 100 UNIT/ML ~~LOC~~ SOLN
0.0000 [IU] | Freq: Every day | SUBCUTANEOUS | Status: DC
  Administered 2014-06-04: 5 [IU] via SUBCUTANEOUS
  Administered 2014-06-05: 2 [IU] via SUBCUTANEOUS

## 2014-06-04 MED ORDER — SODIUM CHLORIDE 0.9 % IV SOLN: 250.0000 mL | INTRAVENOUS | Status: DC | PRN

## 2014-06-04 MED ORDER — BENAZEPRIL-HYDROCHLOROTHIAZIDE 20-25 MG PO TABS: 1.0000 | ORAL_TABLET | Freq: Every day | ORAL | Status: DC

## 2014-06-04 MED ORDER — IPRATROPIUM BROMIDE 0.02 % IN SOLN
1.0000 mg | Freq: Once | RESPIRATORY_TRACT | Status: AC
  Administered 2014-06-04: 1 mg via RESPIRATORY_TRACT
  Filled 2014-06-04: qty 5

## 2014-06-04 MED ORDER — SODIUM CHLORIDE 0.9 % IJ SOLN
3.0000 mL | Freq: Two times a day (BID) | INTRAMUSCULAR | Status: DC
  Administered 2014-06-06: 3 mL via INTRAVENOUS

## 2014-06-04 MED ORDER — METHYLPREDNISOLONE SODIUM SUCC 125 MG IJ SOLR
125.0000 mg | Freq: Once | INTRAMUSCULAR | Status: AC
  Administered 2014-06-04: 125 mg via INTRAVENOUS
  Filled 2014-06-04: qty 2

## 2014-06-04 MED ORDER — MORPHINE SULFATE 4 MG/ML IJ SOLN
4.0000 mg | INTRAMUSCULAR | Status: DC | PRN
  Administered 2014-06-04: 4 mg via INTRAVENOUS
  Filled 2014-06-04: qty 1

## 2014-06-04 MED ORDER — PREDNISONE 20 MG PO TABS
40.0000 mg | ORAL_TABLET | Freq: Every day | ORAL | Status: DC
  Administered 2014-06-05: 40 mg via ORAL
  Filled 2014-06-04 (×2): qty 2

## 2014-06-04 MED ORDER — CARVEDILOL 3.125 MG PO TABS
3.1250 mg | ORAL_TABLET | Freq: Two times a day (BID) | ORAL | Status: DC
  Filled 2014-06-04 (×4): qty 1

## 2014-06-04 MED ORDER — HYDROCODONE-ACETAMINOPHEN 5-325 MG PO TABS
2.0000 | ORAL_TABLET | Freq: Once | ORAL | Status: AC
  Administered 2014-06-04: 2 via ORAL
  Filled 2014-06-04: qty 2

## 2014-06-04 MED ORDER — HYDROCODONE-ACETAMINOPHEN 5-325 MG PO TABS
1.0000 | ORAL_TABLET | Freq: Four times a day (QID) | ORAL | Status: DC | PRN
  Administered 2014-06-04 – 2014-06-05 (×3): 1 via ORAL
  Filled 2014-06-04: qty 1
  Filled 2014-06-04: qty 2
  Filled 2014-06-04: qty 1

## 2014-06-04 MED ORDER — GABAPENTIN 400 MG PO CAPS
400.0000 mg | ORAL_CAPSULE | Freq: Three times a day (TID) | ORAL | Status: DC
  Administered 2014-06-04 – 2014-06-06 (×6): 400 mg via ORAL
  Filled 2014-06-04 (×7): qty 1

## 2014-06-04 MED ORDER — IPRATROPIUM-ALBUTEROL 0.5-2.5 (3) MG/3ML IN SOLN: 3.0000 mL | RESPIRATORY_TRACT | Status: DC

## 2014-06-04 MED ORDER — MAGNESIUM SULFATE 40 MG/ML IJ SOLN
2.0000 g | INTRAMUSCULAR | Status: AC
  Administered 2014-06-04: 2 g via INTRAVENOUS
  Filled 2014-06-04: qty 50

## 2014-06-04 MED ORDER — INSULIN GLARGINE 100 UNIT/ML SOLOSTAR PEN: 10.0000 [IU] | PEN_INJECTOR | Freq: Every day | SUBCUTANEOUS | Status: DC

## 2014-06-04 MED ORDER — SIMVASTATIN 10 MG PO TABS
10.0000 mg | ORAL_TABLET | Freq: Every day | ORAL | Status: DC
  Administered 2014-06-04 – 2014-06-05 (×2): 10 mg via ORAL
  Filled 2014-06-04 (×4): qty 1

## 2014-06-04 MED ORDER — SERTRALINE HCL 25 MG PO TABS
25.0000 mg | ORAL_TABLET | Freq: Every day | ORAL | Status: DC
  Administered 2014-06-04 – 2014-06-06 (×3): 25 mg via ORAL
  Filled 2014-06-04 (×3): qty 1

## 2014-06-04 MED ORDER — MOMETASONE FURO-FORMOTEROL FUM 100-5 MCG/ACT IN AERO
2.0000 | INHALATION_SPRAY | Freq: Two times a day (BID) | RESPIRATORY_TRACT | Status: DC
  Administered 2014-06-04 – 2014-06-06 (×4): 2 via RESPIRATORY_TRACT
  Filled 2014-06-04: qty 8.8

## 2014-06-04 MED ORDER — SODIUM CHLORIDE 0.9 % IJ SOLN: 3.0000 mL | INTRAMUSCULAR | Status: DC | PRN

## 2014-06-04 MED ORDER — INFLUENZA VAC SPLIT QUAD 0.5 ML IM SUSY
0.5000 mL | PREFILLED_SYRINGE | INTRAMUSCULAR | Status: AC
  Administered 2014-06-05: 0.5 mL via INTRAMUSCULAR
  Filled 2014-06-04: qty 0.5

## 2014-06-04 MED ORDER — ASPIRIN EC 325 MG PO TBEC
325.0000 mg | DELAYED_RELEASE_TABLET | Freq: Every day | ORAL | Status: DC
  Administered 2014-06-04 – 2014-06-06 (×3): 325 mg via ORAL
  Filled 2014-06-04 (×3): qty 1

## 2014-06-04 MED ORDER — ENOXAPARIN SODIUM 40 MG/0.4ML ~~LOC~~ SOLN
40.0000 mg | SUBCUTANEOUS | Status: DC
  Administered 2014-06-04 – 2014-06-05 (×2): 40 mg via SUBCUTANEOUS
  Filled 2014-06-04 (×3): qty 0.4

## 2014-06-04 MED ORDER — POTASSIUM CHLORIDE CRYS ER 20 MEQ PO TBCR
40.0000 meq | EXTENDED_RELEASE_TABLET | Freq: Two times a day (BID) | ORAL | Status: DC
  Administered 2014-06-04: 40 meq via ORAL
  Filled 2014-06-04 (×3): qty 2

## 2014-06-04 MED ORDER — INSULIN ASPART 100 UNIT/ML ~~LOC~~ SOLN
0.0000 [IU] | Freq: Three times a day (TID) | SUBCUTANEOUS | Status: DC
  Administered 2014-06-04: 20 [IU] via SUBCUTANEOUS
  Administered 2014-06-05: 7 [IU] via SUBCUTANEOUS
  Administered 2014-06-05 (×2): 11 [IU] via SUBCUTANEOUS
  Administered 2014-06-06: 3 [IU] via SUBCUTANEOUS
  Administered 2014-06-06: 4 [IU] via SUBCUTANEOUS

## 2014-06-04 MED ORDER — MAGNESIUM SULFATE 40 MG/ML IJ SOLN: 2.0000 g | Freq: Once | INTRAMUSCULAR | Status: DC

## 2014-06-04 MED ORDER — INSULIN GLARGINE 100 UNIT/ML ~~LOC~~ SOLN
10.0000 [IU] | Freq: Every day | SUBCUTANEOUS | Status: DC
  Administered 2014-06-04 – 2014-06-05 (×2): 10 [IU] via SUBCUTANEOUS
  Filled 2014-06-04 (×3): qty 0.1

## 2014-06-04 MED ORDER — TOPIRAMATE 25 MG PO TABS
50.0000 mg | ORAL_TABLET | Freq: Two times a day (BID) | ORAL | Status: DC
  Administered 2014-06-04 – 2014-06-06 (×4): 50 mg via ORAL
  Filled 2014-06-04 (×5): qty 2

## 2014-06-04 MED ORDER — AMLODIPINE BESYLATE 10 MG PO TABS
10.0000 mg | ORAL_TABLET | Freq: Every day | ORAL | Status: DC
  Filled 2014-06-04 (×2): qty 1

## 2014-06-04 MED ORDER — IPRATROPIUM-ALBUTEROL 0.5-2.5 (3) MG/3ML IN SOLN
3.0000 mL | RESPIRATORY_TRACT | Status: DC
  Administered 2014-06-04 – 2014-06-06 (×10): 3 mL via RESPIRATORY_TRACT
  Filled 2014-06-04 (×11): qty 3

## 2014-06-04 MED ORDER — POTASSIUM CHLORIDE 20 MEQ/15ML (10%) PO LIQD
40.0000 meq | Freq: Once | ORAL | Status: AC
  Administered 2014-06-04: 40 meq via ORAL
  Filled 2014-06-04: qty 30

## 2014-06-04 MED ORDER — ALBUTEROL (5 MG/ML) CONTINUOUS INHALATION SOLN
10.0000 mg/h | INHALATION_SOLUTION | Freq: Once | RESPIRATORY_TRACT | Status: AC
  Administered 2014-06-04: 10 mg/h via RESPIRATORY_TRACT
  Filled 2014-06-04: qty 20

## 2014-06-04 MED ORDER — SODIUM CHLORIDE 0.9 % IJ SOLN: 3.0000 mL | Freq: Two times a day (BID) | INTRAMUSCULAR | Status: DC

## 2014-06-04 MED ORDER — POTASSIUM CHLORIDE CRYS ER 20 MEQ PO TBCR
40.0000 meq | EXTENDED_RELEASE_TABLET | Freq: Once | ORAL | Status: AC
  Administered 2014-06-04: 40 meq via ORAL

## 2014-06-04 NOTE — ED Provider Notes (Signed)
CSN: [id number]     Arrival date & time 06/04/14  1133 History   First MD Initiated Contact with Patient 06/04/14 1140     Chief Complaint  Patient presents with  . Shortness of Breath      HPI Pt was seen at 1150.  Per pt, c/o gradual onset and worsening of persistent cough, wheezing and SOB for the past 2 to 3 days.  Describes her symptoms as "my asthma is acting up."  Has been using home MDI and nebs without relief. Has been associated with sore throat and constant mid-sternal chest "pressure" for the past 2 to 3 days. Denies palpitations, no back pain, no abd pain, no N/V/D, no fevers, no rash.     Past Medical History  Diagnosis Date  . Hypertension   . Hypokalemia   . Asthma   . DM (diabetes mellitus)     INSULIN DEPENDENT  . Depression   . GERD (gastroesophageal reflux disease)   . Headache(784.0)   . Chronic back pain   . Chronic chest pain    Past Surgical History  Procedure Laterality Date  . Cesarean section      x 3  . Appendectomy    . Vesicovaginal fistula closure w/ tah  2009  . Hernia repair    . Cholecystectomy N/A 03/02/2014    Procedure: LAPAROSCOPIC CHOLECYSTECTOMY;  Surgeon: Joyice Faster. Cornett, MD;  Location: [facility];  Service: General;  Laterality: N/A;   Family History  Problem Relation Age of Onset  . Heart attack Mother   . Stroke Mother   . Diabetes Mother   . Hypertension Mother   . Arthritis Mother   . Stroke Father   . Hypertension Sister   . Diabetes Sister   . Seizures Brother   . Diabetes Brother    History  Substance Use Topics  . Smoking status: Former Smoker -- 0.30 packs/day for 22 years    Types: Cigarettes    Quit date: 05/01/2011  . Smokeless tobacco: Never Used  . Alcohol Use: Yes     Comment: rare    Review of Systems ROS: Statement: All systems negative except as marked or noted in the HPI; Constitutional: Negative for fever and chills. ; ; Eyes: Negative for eye pain, redness and discharge. ; ; ENMT: Negative for ear  pain, hoarseness, nasal congestion, sinus pressure and +sore throat. ; ; Cardiovascular: Negative for palpitations, diaphoresis, and peripheral edema. ; ; Respiratory: +cough, wheezing, SOB, CP. Negative for stridor. ; ; Gastrointestinal: Negative for nausea, vomiting, diarrhea, abdominal pain, blood in stool, hematemesis, jaundice and rectal bleeding. . ; ; Genitourinary: Negative for dysuria, flank pain and hematuria. ; ; Musculoskeletal: Negative for back pain and neck pain. Negative for swelling and trauma.; ; Skin: Negative for pruritus, rash, abrasions, blisters, bruising and skin lesion.; ; Neuro: Negative for headache, lightheadedness and neck stiffness. Negative for weakness, altered level of consciousness , altered mental status, extremity weakness, paresthesias, involuntary movement, seizure and syncope.      Allergies  Review of patient's allergies indicates no known allergies.  Home Medications   Prior to Admission medications   Medication Sig Start Date End Date Taking? Authorizing Provider  albuterol (PROVENTIL HFA;VENTOLIN HFA) 108 (90 BASE) MCG/ACT inhaler Inhale 2 puffs into the lungs every 6 (six) hours as needed for wheezing or shortness of breath.    Historical Provider, MD  albuterol (PROVENTIL) (2.5 MG/3ML) 0.083% nebulizer solution Take 2.5 mg by nebulization every 6 (six) hours as  needed for wheezing or shortness of breath.    Historical Provider, MD  amLODipine (NORVASC) 10 MG tablet Take 10 mg by mouth daily.    Historical Provider, MD  aspirin EC 325 MG tablet Take 325 mg by mouth daily.    Historical Provider, MD  benazepril-hydrochlorthiazide (LOTENSIN HCT) 20-25 MG per tablet Take 1 tablet by mouth daily.    Historical Provider, MD  carvedilol (COREG) 3.125 MG tablet Take 3.125 mg by mouth 2 (two) times daily with a meal.    Historical Provider, MD  cyclobenzaprine (FLEXERIL) 5 MG tablet Take 5 mg by mouth 3 (three) times daily as needed for muscle spasms.     Historical Provider, MD  Dapagliflozin Propanediol (FARXIGA) 5 MG TABS Take 5 mg by mouth daily.    Historical Provider, MD  Fluticasone-Salmeterol (ADVAIR) 250-50 MCG/DOSE AEPB Inhale 1 puff into the lungs 2 (two) times daily.    Historical Provider, MD  gabapentin (NEURONTIN) 400 MG capsule Take 400 mg by mouth 3 (three) times daily.    Historical Provider, MD  hydrochlorothiazide (HYDRODIURIL) 12.5 MG tablet Take 12.5 mg by mouth daily.    Historical Provider, MD  HYDROcodone-acetaminophen (NORCO/VICODIN) 5-325 MG per tablet Take 1 tablet by mouth every 6 (six) hours as needed for moderate pain or severe pain. 05/18/14   Kelby Aline, MD  Insulin Glargine (LANTUS SOLOSTAR) 100 UNIT/ML Solostar Pen Inject 10 Units into the skin daily at 10 pm.    Historical Provider, MD  insulin NPH-regular Human (NOVOLIN 70/30) (70-30) 100 UNIT/ML injection Inject 50 Units into the skin 2 (two) times daily with a meal.    Historical Provider, MD  ipratropium (ATROVENT) 0.02 % nebulizer solution Take 0.5 mg by nebulization 4 (four) times daily.    Historical Provider, MD  ondansetron (ZOFRAN) 4 MG tablet Take 1 tablet (4 mg total) by mouth every 8 (eight) hours as needed for nausea or vomiting. 05/18/14   Kelby Aline, MD  sertraline (ZOLOFT) 25 MG tablet Take 25 mg by mouth daily.    Historical Provider, MD  simvastatin (ZOCOR) 10 MG tablet Take 10 mg by mouth daily.    Historical Provider, MD  SUMAtriptan (IMITREX) 25 MG tablet Take 25 mg by mouth every 2 (two) hours as needed for migraine or headache. May repeat in 2 hours if headache persists or recurs.    Historical Provider, MD  topiramate (TOPAMAX) 50 MG tablet Take 50 mg by mouth 2 (two) times daily.    Historical Provider, MD   BP 146/69  Pulse 128  Temp(Src) 98.3 F (36.8 C) (Oral)  Resp 17  SpO2 100% Physical Exam 1155: Physical examination:  Nursing notes reviewed; Vital signs and O2 SAT reviewed;  Constitutional: Well developed, Well  nourished, Well hydrated, In no acute distress; Head:  Normocephalic, atraumatic; Eyes: EOMI, PERRL, No scleral icterus; ENMT: Mouth and pharynx normal, Mucous membranes moist. Mouth and pharynx without lesions. No tonsillar exudates. No intra-oral edema. No submandibular or sublingual edema. No hoarse voice, no drooling, no stridor. No pain with manipulation of larynx. No trismus.; Neck: Supple, Full range of motion, No lymphadenopathy; Cardiovascular: Tachycardic rate and rhythm, No gallop; Respiratory: Breath sounds diminished & equal bilaterally, scattered insp/exp wheezing bilat with audible wheezing. Speaking in phrases, sitting upright, tachypneic.; Chest: Nontender, Movement normal; Abdomen: Soft, Nontender, Nondistended, Normal bowel sounds; Genitourinary: No CVA tenderness; Extremities: Pulses normal, No tenderness, No edema, No calf edema or asymmetry.; Neuro: AA&Ox3, Major CN grossly intact.  Speech clear. No gross  focal motor or sensory deficits in extremities.; Skin: Color normal, Warm, Dry.   ED Course  Procedures     EKG Interpretation   Date/Time:  Sunday June 04 2014 11:47:58 EDT Ventricular Rate:  126 PR Interval:  146 QRS Duration: 92 QT Interval:  432 QTC Calculation: 626 R Axis:   38 Text Interpretation:  Sinus tachycardia Probable left atrial enlargement  Prolonged QT interval When compared with ECG of 02/26/2014 QT has lengthened  Otherwise no significant change Confirmed by [facility]  MD, Nunzio Cory  [phone]) on 06/04/2014 12:07:41 PM      MDM  MDM Reviewed: previous chart, nursing note and vitals Reviewed previous: labs and ECG Interpretation: labs, x-ray and ECG Total time providing critical care: 30-74 minutes. This excludes time spent performing separately reportable procedures and services. Consults: admitting MD    CRITICAL CARE Performed by: Alfonzo Feller Total critical care time: 35 Critical care time was exclusive of separately billable  procedures and treating other patients. Critical care was necessary to treat or prevent imminent or life-threatening deterioration. Critical care was time spent personally by me on the following activities: development of treatment plan with patient and/or surrogate as well as nursing, discussions with consultants, evaluation of patient's response to treatment, examination of patient, obtaining history from patient or surrogate, ordering and performing treatments and interventions, ordering and review of laboratory studies, ordering and review of radiographic studies, pulse oximetry and re-evaluation of patient's condition.   Results for orders placed during the hospital encounter of 06/04/14  RAPID STREP SCREEN      Result Value Ref Range   Streptococcus, Group A Screen (Direct) NEGATIVE  NEGATIVE  CBC      Result Value Ref Range   WBC 10.3  4.0 - 10.5 K/uL   RBC 4.04  3.87 - 5.11 MIL/uL   Hemoglobin 11.8 (*) 12.0 - 15.0 g/dL   HCT 34.5 (*) 36.0 - 46.0 %   MCV 85.4  78.0 - 100.0 fL   MCH 29.2  26.0 - 34.0 pg   MCHC 34.2  30.0 - 36.0 g/dL   RDW 14.3  11.5 - 15.5 %   Platelets 278  150 - 400 K/uL  MAGNESIUM      Result Value Ref Range   Magnesium 1.5  1.5 - 2.5 mg/dL  I-STAT CHEM 8, ED      Result Value Ref Range   Sodium 140  137 - 147 mEq/L   Potassium 2.6 (*) 3.7 - 5.3 mEq/L   Chloride 105  96 - 112 mEq/L   BUN 6  6 - 23 mg/dL   Creatinine, Ser 0.80  0.50 - 1.10 mg/dL   Glucose, Bld 207 (*) 70 - 99 mg/dL   Calcium, Ion 1.15  1.12 - 1.23 mmol/L   TCO2 19  0 - 100 mmol/L   Hemoglobin 12.6  12.0 - 15.0 g/dL   HCT 37.0  36.0 - 46.0 %   Comment NOTIFIED PHYSICIAN    I-STAT TROPOININ, ED      Result Value Ref Range   Troponin i, poc 0.00  0.00 - 0.08 ng/mL   Comment 3            Dg Chest 2 View 06/04/2014   CLINICAL DATA:  Shortness of breath.  History of asthma.  EXAM: CHEST  2 VIEW  COMPARISON:  Chest radiograph 02/26/2014 and chest CT 11/10/2013  FINDINGS: Large habitus.  Portion of the abdominal pannus projects over the lower chest. Cardiothymic silhouette appears stable.  Heart size appears borderline enlarged by chest radiograph. Pulmonary vascularity is normal. Lung volumes are slightly low. Lungs appear clear. Negative for pleural effusion or pneumothorax. Visualized bony thorax is intact.  IMPRESSION: No acute cardiopulmonary disease identified.   Electronically Signed   By: Curlene Dolphin M.D.   On: 06/04/2014 14:08    1440:  On arrival to the ED, pt tachycardic and tachypneic with insp/exp wheezing as well as audible wheezing: hour long neb and IV solumedrol given. Potassium repleted PO. Meds given for pt's complaints of various body aches/pains. Neb completed: pt remains tachycardic with faint scattered insp/exp wheezing bilat and pt continues to c/o generalized chest "tightness." No further audible wheezing. Doubt PE as cause for symptoms with low risk [place].  Doubt ACS as cause for symptoms with normal troponin and unchanged EKG from previous after 2 days of constant symptoms. Dx and testing d/w pt and family.  Questions answered.  Verb understanding, agreeable to admit.  T/C to [facility] Resident, case discussed, including:  HPI, pertinent PM/SHx, VS/PE, dx testing, ED course and treatment:  Agreeable to observation admit, requests they will come to the ED for evaluation.       Francine Graven, DO 06/07/14 [phone]

## 2014-06-04 NOTE — ED Notes (Signed)
Patient transported to X-ray 

## 2014-06-04 NOTE — H&P (Signed)
Date: 06/04/2014               Patient Name:  [name] MRN: [id number]  DOB: Dec 07, 1970 Age / Sex: 43 y.o., female   PCP: Lance Bosch, NP         Medical Service: Internal Medicine Teaching Service         Attending Physician: Dr. Aldine Contes, MD    First Contact: Dr. Albin Felling Pager: [phone]  Second Contact: Dr. Clayburn Pert Pager: [phone]       After Hours (After 5p/  First Contact Pager: [phone]  weekends / holidays): Second Contact Pager: [phone]   Chief Complaint: Shortness of breath  History of Present Illness: [name] is a 43yo woman w/ PMHx of asthma, Type 2 DM, HTN, HLD, chronic back pain, and migraines who presented to the ED with a 2 day history of SOB, wheezing, coughing, and chest tightness. Patient reports all of her symptoms started yesterday morning. She states she tried using her Albuterol inhaler and nebulizer twice yesterday and once this morning with no relief. Her last asthma exacerbation was about one year ago. She states she seldom uses her albuterol inhaler. She describes her chest tightness as 9/10 in severity and substernal. She reports a sore throat and some nausea, but no vomiting. She denies fever, chills, palpitations, back pain, and abdominal pain. She states a few family members have been sick in the household and she believes she may have caught a cold from them. She denies smoking.  In the ED, patient received Solu-medrol 125 mg IV once and ipratropium nebulizer. CXR showed no acute cardiopulmonary disease.   Meds: Current Facility-Administered Medications  Medication Dose Route Frequency Provider Last Rate Last Dose  . magnesium sulfate IVPB 2 g 50 mL  2 g Intravenous Once Otho Bellows, MD      . morphine 4 MG/ML injection 4 mg  4 mg Intravenous Q1H PRN Francine Graven, DO   4 mg at 06/04/14 1441   Current Outpatient Prescriptions  Medication Sig Dispense Refill  . albuterol (PROVENTIL HFA;VENTOLIN HFA) 108 (90 BASE)  MCG/ACT inhaler Inhale 2 puffs into the lungs every 6 (six) hours as needed for wheezing or shortness of breath.      Marland Kitchen albuterol (PROVENTIL) (2.5 MG/3ML) 0.083% nebulizer solution Take 2.5 mg by nebulization every 6 (six) hours as needed for wheezing or shortness of breath.      Marland Kitchen amLODipine (NORVASC) 10 MG tablet Take 10 mg by mouth daily.      Marland Kitchen aspirin EC 325 MG tablet Take 325 mg by mouth daily.      . benazepril-hydrochlorthiazide (LOTENSIN HCT) 20-25 MG per tablet Take 1 tablet by mouth daily.      . carvedilol (COREG) 3.125 MG tablet Take 3.125 mg by mouth 2 (two) times daily with a meal.      . cyclobenzaprine (FLEXERIL) 5 MG tablet Take 5 mg by mouth 3 (three) times daily as needed for muscle spasms.      . Dapagliflozin Propanediol (FARXIGA) 5 MG TABS Take 5 mg by mouth daily.      . Fluticasone-Salmeterol (ADVAIR) 250-50 MCG/DOSE AEPB Inhale 1 puff into the lungs 2 (two) times daily.      Marland Kitchen gabapentin (NEURONTIN) 400 MG capsule Take 400 mg by mouth 3 (three) times daily.      . hydrochlorothiazide (HYDRODIURIL) 12.5 MG tablet Take 12.5 mg by mouth daily.      Marland Kitchen HYDROcodone-acetaminophen (NORCO/VICODIN) 5-325  MG per tablet Take 1 tablet by mouth every 6 (six) hours as needed for moderate pain or severe pain.  6 tablet  0  . Insulin Glargine (LANTUS SOLOSTAR) 100 UNIT/ML Solostar Pen Inject 10 Units into the skin daily at 10 pm.      . insulin NPH-regular Human (NOVOLIN 70/30) (70-30) 100 UNIT/ML injection Inject 50 Units into the skin 2 (two) times daily with a meal.      . ipratropium (ATROVENT) 0.02 % nebulizer solution Take 0.5 mg by nebulization 4 (four) times daily.      . ondansetron (ZOFRAN) 4 MG tablet Take 1 tablet (4 mg total) by mouth every 8 (eight) hours as needed for nausea or vomiting.  12 tablet  0  . sertraline (ZOLOFT) 25 MG tablet Take 25 mg by mouth daily.      . simvastatin (ZOCOR) 10 MG tablet Take 10 mg by mouth daily.      . SUMAtriptan (IMITREX) 25 MG tablet Take  25 mg by mouth every 2 (two) hours as needed for migraine or headache. May repeat in 2 hours if headache persists or recurs.      . topiramate (TOPAMAX) 50 MG tablet Take 50 mg by mouth 2 (two) times daily.        Allergies: Allergies as of 06/04/2014  . (No Known Allergies)   Past Medical History  Diagnosis Date  . Hypertension   . Hypokalemia   . Asthma   . DM (diabetes mellitus)     INSULIN DEPENDENT  . Depression   . GERD (gastroesophageal reflux disease)   . Headache(784.0)   . Chronic back pain   . Chronic chest pain    Past Surgical History  Procedure Laterality Date  . Cesarean section      x 3  . Appendectomy    . Vesicovaginal fistula closure w/ tah  2009  . Hernia repair    . Cholecystectomy N/A 03/02/2014    Procedure: LAPAROSCOPIC CHOLECYSTECTOMY;  Surgeon: Joyice Faster. Cornett, MD;  Location: [facility];  Service: General;  Laterality: N/A;   Family History  Problem Relation Age of Onset  . Heart attack Mother   . Stroke Mother   . Diabetes Mother   . Hypertension Mother   . Arthritis Mother   . Stroke Father   . Hypertension Sister   . Diabetes Sister   . Seizures Brother   . Diabetes Brother    History   Social History  . Marital Status: Married    Spouse Name: N/A    Number of Children: N/A  . Years of Education: N/A   Occupational History  . Not on file.   Social History Main Topics  . Smoking status: Former Smoker -- 0.30 packs/day for 22 years    Types: Cigarettes    Quit date: 05/01/2011  . Smokeless tobacco: Never Used  . Alcohol Use: Yes     Comment: rare  . Drug Use: No  . Sexual Activity: Not on file   Other Topics Concern  . Not on file   Social History Narrative  . No narrative on file    Review of Systems: General: Denies night sweats, changes in weight, changes in appetite HEENT: Denies ear pain, changes in vision, rhinorrhea CV: Denies orthopnea Pulm: See HPI GI: Denies constipation, melena, hematochezia GU: Denies  dysuria, hematuria, frequency Msk: Denies muscle cramps, joint pains Neuro: Denies weakness, numbness, tingling Skin: Denies rashes, bruising  Physical Exam: Blood pressure 92/40, pulse  118, temperature 98.3 F (36.8 C), temperature source Oral, resp. rate 21, SpO2 100.00%. General: sitting up in bed, appears uncomfortable, breaths labored HEENT: [place]/AT, EOMI, PERRL, sclera anicteric, pharynx non-erythematous, mucus membranes moist Neck: supple, no JVD, no lymphadenopathy CV: tachycardic, normal S1/S2, no m/g/r Pulm: minimal wheezing heard bilaterally, breaths labored Abd: BS+, soft, non-distended, non-tender Ext: warm, no edema, moves all Neuro: alert and oriented x 3, CNs II-XII intact, strength 5/5 in upper and lower extremities bilaterally  Lab results: Basic Metabolic Panel:  Recent Labs  06/04/14 1159 06/04/14 1215  NA  --  140  K  --  2.6*  CL  --  105  GLUCOSE  --  207*  BUN  --  6  CREATININE  --  0.80  MG 1.5  --    Liver Function Tests: No results found for this basename: AST, ALT, ALKPHOS, BILITOT, PROT, ALBUMIN,  in the last 72 hours No results found for this basename: LIPASE, AMYLASE,  in the last 72 hours No results found for this basename: AMMONIA,  in the last 72 hours CBC:  Recent Labs  06/04/14 1159 06/04/14 1215  WBC 10.3  --   HGB 11.8* 12.6  HCT 34.5* 37.0  MCV 85.4  --   PLT 278  --    Cardiac Enzymes: No results found for this basename: CKTOTAL, CKMB, CKMBINDEX, TROPONINI,  in the last 72 hours BNP: No results found for this basename: PROBNP,  in the last 72 hours D-Dimer: No results found for this basename: DDIMER,  in the last 72 hours CBG: No results found for this basename: GLUCAP,  in the last 72 hours Hemoglobin A1C: No results found for this basename: HGBA1C,  in the last 72 hours Fasting Lipid Panel: No results found for this basename: CHOL, HDL, LDLCALC, TRIG, CHOLHDL, LDLDIRECT,  in the last 72 hours Thyroid Function  Tests: No results found for this basename: TSH, T4TOTAL, FREET4, T3FREE, THYROIDAB,  in the last 72 hours Anemia Panel: No results found for this basename: VITAMINB12, FOLATE, FERRITIN, TIBC, IRON, RETICCTPCT,  in the last 72 hours Coagulation: No results found for this basename: LABPROT, INR,  in the last 72 hours Urine Drug Screen: Drugs of Abuse     Component Value Date/Time   LABOPIA NONE DETECTED 05/31/2008 0439   COCAINSCRNUR NONE DETECTED 05/31/2008 0439   LABBENZ NONE DETECTED 05/31/2008 0439   AMPHETMU NONE DETECTED 05/31/2008 0439   THCU NONE DETECTED 05/31/2008 0439   LABBARB  Value: [place].  IF CONFIRMATION IS NEEDED FOR ANY PURPOSE, NOTIFY LAB WITHIN 5 DAYS. 05/31/2008 0439    Alcohol Level: No results found for this basename: ETH,  in the last 72 hours Urinalysis: No results found for this basename: COLORURINE, APPERANCEUR, LABSPEC, [place], GLUCOSEU, HGBUR, BILIRUBINUR, KETONESUR, PROTEINUR, UROBILINOGEN, NITRITE, LEUKOCYTESUR,  in the last 72 hours   Imaging results:  Dg Chest 2 View  06/04/2014   CLINICAL DATA:  Shortness of breath.  History of asthma.  EXAM: CHEST  2 VIEW  COMPARISON:  Chest radiograph 02/26/2014 and chest CT 11/10/2013  FINDINGS: Large habitus. Portion of the abdominal pannus projects over the lower chest. Cardiothymic silhouette appears stable. Heart size appears borderline enlarged by chest radiograph. Pulmonary vascularity is normal. Lung volumes are slightly low. Lungs appear clear. Negative for pleural effusion or pneumothorax. Visualized bony thorax is intact.  IMPRESSION: No acute cardiopulmonary disease identified.   Electronically  Signed   By: Curlene Dolphin M.D.   On: 06/04/2014 14:08    Other results: EKG: Sinus tachycardia, prolonged QT at 626  Assessment & Plan: [name] is a 43yo woman w/ PMHx of asthma, Type 2 DM, HTN, HLD, and migraines who presented to the ED with a 2 day history of SOB,  wheezing, coughing, and chest tightness likely 2/2 asthma exacerbation.  1. Asthma Exacerbation: Patient presenting with SOB, coughing, wheezing, and chest tightness. She tried her home Albuterol and nebulizer with minimal relief. Patient reports history of sick contacts, possible etiology of exacerbation. Her last asthma exacerbation was over a year ago and she states she does not need to use her inhaler very often.  - Check peak flow - Incentive spirometry - Duonebs Q4H - Dulera BID - Prednisone 40 mg daily starting tomorrow - Cardiac monitoring - Respiratory viral panel  2. Prolonged QTc on EKG: Patient with QTc of 626 on admission. Previous EKG on 03/05/14 shows QTc of 445.  - Repeat EKG tomorrow AM - Magnesium 2g  3. HTN: BP 146/69 on admission. Now in 90s-120s/40-60s. Patient takes Amlodipine 10 mg daily, Lotensin 20-25 mg daily, Coreg 3.125 mg BID, and HCTZ 12.5 mg daily. - Continue home meds - Monitor VS  4. Type 2 DM: Last HbA1c 9.4 on 02/27/14. Patient takes Lantus 10 units QHS and NPH 50 units BID with meals.  - Continue home Lantus 10 units QHS - ISS - HbA1c - CBGs 4 times daily  5. Hx of Migraines: Patient takes Imitrex 25 mg every 2 hours as needed for migraines at home.  - Topamax 50 mg BID  6. HLD: Patient takes Simvastatin 10 mg daily at home. - Continue Simvastatin  7. Chronic Back Pain: Patient takes Gabapentin 400 mg TID and Norco 5-325 mg Q6H PRN for back pain. - Continue Gabapentin 400 mg TID - Continue Norco 5-325 mg Q6H PRN  Diet: Heart healthy/Carb modified DVT/PE PPx: Lovenox SQ Dispo: Disposition is deferred at this time, awaiting improvement of current medical problems. Anticipated discharge in approximately 1-2 day(s).   The patient does have a current PCP Lance Bosch, NP) and does need an [facility] hospital follow-up appointment after discharge.  The patient does not have transportation limitations that hinder transportation to clinic  appointments.  Signed: Albin Felling, MD 06/04/2014, 4:49 PM

## 2014-06-04 NOTE — ED Notes (Addendum)
Hx of asthma, used home neb x 2. Spo2 was 100% 10 or albuterol and .5 atrovent. Has had cough and sore throat x 2 days. Expiratory wheeze.  Pt c/o central cp 9/10 since last night. Feel like pressure. Pt states has had pain like this with asthma in the past

## 2014-06-04 NOTE — ED Notes (Signed)
EKG delayed due to equipment malfunction 

## 2014-06-04 NOTE — Progress Notes (Signed)
EKG completed. Resulted abnormal T wave and sinus tach. MD made aware. 2nd EKG taken and resulted only sinus tach.

## 2014-06-04 NOTE — Progress Notes (Signed)
Report received from Maudie Mercury, [place] for admission to [phone]

## 2014-06-04 NOTE — ED Notes (Signed)
Chem 8 results given to Dr. Thurnell Garbe on [address]

## 2014-06-04 NOTE — Progress Notes (Signed)
NATALYA DOMZALSKI [id number] Code Status: Full   Admission Data: 06/04/2014 5:37 PM Attending Provider:  Narendra ZDG:UYQI, VALERIE, NP Consults/ Treatment Team:    CAROLIN QUANG is a 43 y.o. female patient admitted from ED awake, alert - oriented  X 3 - no acute distress noted.  VSS - Blood pressure 123/69, pulse 127, temperature 98.7 F (37.1 C), temperature source Oral, resp. rate 20, height 5\' 1"  (1.549 m), weight 107.1 kg (236 lb 1.8 oz), SpO2 100.00%.    IV in place, occlusive dsg intact without redness.  Orientation to room, and floor completed with information packet given to patient/family.  Patient declined safety video at this time.  Admission INP armband ID verified with patient/family, and in place.   SR up x 2, fall assessment complete, with patient and family able to verbalize understanding of risk associated with falls, and verbalized understanding to call nsg before up out of bed.  Call light within reach, patient able to voice, and demonstrate understanding.  Skin, clean-dry- intact without evidence of bruising, or skin tears.   No evidence of skin break down noted on exam.    MD contacted clarifying K level of 2.6 and question for potassium supplement to be given sooner than 10pm scheduled. Telephone order to give dose now and scheduled BMET lab draw 1hr after dose.     Will cont to eval and treat per MD orders.  Delman Cheadle, RN 06/04/2014 5:37 PM

## 2014-06-04 NOTE — ED Notes (Signed)
Pt given ginger ale to drink. 

## 2014-06-05 LAB — RESPIRATORY VIRUS PANEL
Adenovirus: NOT DETECTED
INFLUENZA B 1: NOT DETECTED
Influenza A H1: NOT DETECTED
Influenza A H3: NOT DETECTED
Influenza A: NOT DETECTED
Metapneumovirus: NOT DETECTED
PARAINFLUENZA 1 A: NOT DETECTED
PARAINFLUENZA 2 A: NOT DETECTED
Parainfluenza 3: NOT DETECTED
Respiratory Syncytial Virus A: NOT DETECTED
Respiratory Syncytial Virus B: NOT DETECTED
Rhinovirus: DETECTED — AB

## 2014-06-05 LAB — URINALYSIS, ROUTINE W REFLEX MICROSCOPIC
Bilirubin Urine: NEGATIVE
HGB URINE DIPSTICK: NEGATIVE
Ketones, ur: NEGATIVE mg/dL
Leukocytes, UA: NEGATIVE
Nitrite: NEGATIVE
PROTEIN: NEGATIVE mg/dL
Specific Gravity, Urine: 1.015 (ref 1.005–1.030)
UROBILINOGEN UA: 0.2 mg/dL (ref 0.0–1.0)
pH: 6 (ref 5.0–8.0)

## 2014-06-05 LAB — BASIC METABOLIC PANEL
ANION GAP: 13 (ref 5–15)
Anion gap: 14 (ref 5–15)
BUN: 13 mg/dL (ref 6–23)
BUN: 12 mg/dL (ref 6–23)
CALCIUM: 9 mg/dL (ref 8.4–10.5)
CALCIUM: 9 mg/dL (ref 8.4–10.5)
CHLORIDE: 102 meq/L (ref 96–112)
CO2: 18 mEq/L — ABNORMAL LOW (ref 19–32)
CO2: 20 meq/L (ref 19–32)
CREATININE: 0.82 mg/dL (ref 0.50–1.10)
Chloride: 103 mEq/L (ref 96–112)
Creatinine, Ser: 0.87 mg/dL (ref 0.50–1.10)
GFR calc Af Amer: >90 mL/min (ref >90)
GFR calc Af Amer: >90 mL/min (ref >90)
GFR calc non Af Amer: 80 mL/min — ABNORMAL LOW (ref >90)
GFR, EST NON AFRICAN AMERICAN: 86 mL/min — AB (ref >90)
GLUCOSE: 297 mg/dL — AB (ref 70–99)
Glucose, Bld: 257 mg/dL — ABNORMAL HIGH (ref 70–99)
Potassium: 5.6 mEq/L — ABNORMAL HIGH (ref 3.7–5.3)
Potassium: 4.3 mEq/L (ref 3.7–5.3)
SODIUM: 136 meq/L — AB (ref 137–147)
Sodium: 134 mEq/L — ABNORMAL LOW (ref 137–147)

## 2014-06-05 LAB — GLUCOSE, CAPILLARY
GLUCOSE-CAPILLARY: 300 mg/dL — AB (ref 70–99)
GLUCOSE-CAPILLARY: 238 mg/dL — AB (ref 70–99)
Glucose-Capillary: 300 mg/dL — ABNORMAL HIGH (ref 70–99)
Glucose-Capillary: 238 mg/dL — ABNORMAL HIGH (ref 70–99)

## 2014-06-05 LAB — HEMOGLOBIN A1C
Hgb A1c MFr Bld: 9.3 % — ABNORMAL HIGH (ref <5.7)
MEAN PLASMA GLUCOSE: 220 mg/dL — AB (ref <117)

## 2014-06-05 LAB — URINE MICROSCOPIC-ADD ON

## 2014-06-05 LAB — KETONES, QUALITATIVE: Acetone, Bld: NEGATIVE

## 2014-06-05 LAB — LACTIC ACID, PLASMA: Lactic Acid, Venous: 2.8 mmol/L — ABNORMAL HIGH (ref 0.5–2.2)

## 2014-06-05 MED ORDER — IBUPROFEN 600 MG PO TABS
600.0000 mg | ORAL_TABLET | Freq: Once | ORAL | Status: AC
  Administered 2014-06-05: 600 mg via ORAL
  Filled 2014-06-05 (×2): qty 1

## 2014-06-05 MED ORDER — SODIUM CHLORIDE 0.9 % IV SOLN
INTRAVENOUS | Status: DC
  Administered 2014-06-05: 11:00:00 via INTRAVENOUS
  Administered 2014-06-06: 1000 mL via INTRAVENOUS

## 2014-06-05 MED ORDER — INSULIN ASPART 100 UNIT/ML ~~LOC~~ SOLN
10.0000 [IU] | Freq: Once | SUBCUTANEOUS | Status: AC
  Administered 2014-06-05: 10 [IU] via SUBCUTANEOUS

## 2014-06-05 MED ORDER — GUAIFENESIN 100 MG/5ML PO SYRP
200.0000 mg | ORAL_SOLUTION | ORAL | Status: DC | PRN
  Administered 2014-06-05: 200 mg via ORAL
  Filled 2014-06-05 (×2): qty 10

## 2014-06-05 MED ORDER — PREDNISONE 50 MG PO TABS
60.0000 mg | ORAL_TABLET | Freq: Every day | ORAL | Status: DC
  Administered 2014-06-06: 60 mg via ORAL
  Filled 2014-06-05 (×2): qty 1

## 2014-06-05 MED ORDER — INSULIN ASPART 100 UNIT/ML ~~LOC~~ SOLN
5.0000 [IU] | Freq: Three times a day (TID) | SUBCUTANEOUS | Status: DC
  Administered 2014-06-05 – 2014-06-06 (×3): 5 [IU] via SUBCUTANEOUS

## 2014-06-05 MED ORDER — PREDNISONE 20 MG PO TABS
20.0000 mg | ORAL_TABLET | Freq: Once | ORAL | Status: AC
  Administered 2014-06-05: 20 mg via ORAL
  Filled 2014-06-05: qty 1

## 2014-06-05 MED ORDER — SODIUM CHLORIDE 0.9 % IV BOLUS (SEPSIS)
500.0000 mL | Freq: Once | INTRAVENOUS | Status: AC
  Administered 2014-06-05: 500 mL via INTRAVENOUS

## 2014-06-05 NOTE — Plan of Care (Signed)
Problem: Phase I Progression Outcomes Goal: Initial discharge plan identified Outcome: Completed/Met Date Met:  06/05/14 To return home with husband

## 2014-06-05 NOTE — Progress Notes (Signed)
Utilization review completed.  

## 2014-06-05 NOTE — H&P (Signed)
Pt seen and examined with Dr. Arcelia Jew. I agree with findings and plan as documented in her note. Please refer to my note for details

## 2014-06-05 NOTE — Progress Notes (Signed)
Subjective: Patient with significant wheezing this AM. Patient reports she feels better compared to yesterday, but that she has been coughing and wheezing.   Objective: Vital signs in last 24 hours: Filed Vitals:   06/04/14 2146 06/05/14 0455 06/05/14 0600 06/05/14 1337  BP: 108/49  117/65 136/95  Pulse: 102  90 98  Temp:   98.5 F (36.9 C) 98.1 F (36.7 C)  TempSrc:   Oral Oral  Resp:   20 20  Height:      Weight:      SpO2:  100% 97% 98%   Weight change:   Intake/Output Summary (Last 24 hours) at 06/05/14 1353 Last data filed at 06/05/14 1126  Gross per 24 hour  Intake 868.33 ml  Output    800 ml  Net  68.33 ml   Physical Exam General: alert, sitting up in bed, wheezes heard at bedside HEENT: [place]/AT, EOMI, mucus membranes moist CV: RRR, normal S1/S2, no m/g/r Pulm: wheezing, but moving good volumes of air Abd: BS+, soft, non-tender Ext: warm, no edema, moves all Neuro: alert and oriented x 3, CNs II-XII intact  Lab Results: Basic Metabolic Panel:  Recent Labs Lab 06/04/14 1159  06/05/14 0347 06/05/14 0956  NA  --   < > 134* 136*  K  --   < > 5.6* 4.3  CL  --   < > 102 103  CO2  --   < > 18* 20  GLUCOSE  --   < > 297* 257*  BUN  --   < > 13 12  CREATININE  --   < > 0.87 0.82  CALCIUM  --   < > 9.0 9.0  MG 1.5  --   --   --   < > = values in this interval not displayed. Liver Function Tests: No results found for this basename: AST, ALT, ALKPHOS, BILITOT, PROT, ALBUMIN,  in the last 168 hours No results found for this basename: LIPASE, AMYLASE,  in the last 168 hours No results found for this basename: AMMONIA,  in the last 168 hours CBC:  Recent Labs Lab 06/04/14 1159 06/04/14 1215  WBC 10.3  --   HGB 11.8* 12.6  HCT 34.5* 37.0  MCV 85.4  --   PLT 278  --    Cardiac Enzymes: No results found for this basename: CKTOTAL, CKMB, CKMBINDEX, TROPONINI,  in the last 168 hours BNP: No results found for this basename: PROBNP,  in the last 168  hours D-Dimer: No results found for this basename: DDIMER,  in the last 168 hours CBG:  Recent Labs Lab 06/04/14 1721 06/04/14 2119 06/05/14 0813 06/05/14 1147  GLUCAP 388* 386* 300* 238*   Hemoglobin A1C:  Recent Labs Lab 06/04/14 1939  HGBA1C 9.3*   Fasting Lipid Panel: No results found for this basename: CHOL, HDL, LDLCALC, TRIG, CHOLHDL, LDLDIRECT,  in the last 168 hours Thyroid Function Tests: No results found for this basename: TSH, T4TOTAL, FREET4, T3FREE, THYROIDAB,  in the last 168 hours Coagulation: No results found for this basename: LABPROT, INR,  in the last 168 hours Anemia Panel: No results found for this basename: VITAMINB12, FOLATE, FERRITIN, TIBC, IRON, RETICCTPCT,  in the last 168 hours Urine Drug Screen: Drugs of Abuse     Component Value Date/Time   LABOPIA NONE DETECTED 05/31/2008 [place] 05/31/2008 [place] 05/31/2008 0439   AMPHETMU NONE DETECTED 05/31/2008 [place]  05/31/2008 0439   LABBARB  Value: NONE DETECTED        DRUG SCREEN FOR MEDICAL PURPOSES ONLY.  IF CONFIRMATION IS NEEDED FOR ANY PURPOSE, NOTIFY LAB WITHIN 5 DAYS. 05/31/2008 0439    Alcohol Level: No results found for this basename: ETH,  in the last 168 hours Urinalysis:  Recent Labs Lab 06/05/14 1100  [place] 1.015  PHURINE 6.0  GLUCOSEU >1000*  HGBUR NEGATIVE  BILIRUBINUR NEGATIVE  KETONESUR NEGATIVE  PROTEINUR NEGATIVE  UROBILINOGEN 0.2  NITRITE NEGATIVE  LEUKOCYTESUR NEGATIVE    Micro Results: Recent Results (from the past 240 hour(s))  RAPID STREP SCREEN     Status: None   Collection Time    06/04/14 12:01 PM      Result Value Ref Range Status   Streptococcus, Group A Screen (Direct) NEGATIVE  NEGATIVE Final   Comment: (NOTE)     A Rapid Antigen test may result negative if the antigen level in the     sample is below the detection level of this test. The FDA has not     cleared this test  as a stand-alone test therefore the rapid antigen     negative result has reflexed to a Group A Strep culture.   Studies/Results: Dg Chest 2 View  06/04/2014   CLINICAL DATA:  Shortness of breath.  History of asthma.  EXAM: CHEST  2 VIEW  COMPARISON:  Chest radiograph 02/26/2014 and chest CT 11/10/2013  FINDINGS: Large habitus. Portion of the abdominal pannus projects over the lower chest. Cardiothymic silhouette appears stable. Heart size appears borderline enlarged by chest radiograph. Pulmonary vascularity is normal. Lung volumes are slightly low. Lungs appear clear. Negative for pleural effusion or pneumothorax. Visualized bony thorax is intact.  IMPRESSION: No acute cardiopulmonary disease identified.   Electronically Signed   By: Curlene Dolphin M.D.   On: 06/04/2014 14:08   Medications: I have reviewed the patient's current medications. Scheduled Meds: . aspirin EC  325 mg Oral Daily  . enoxaparin (LOVENOX) injection  40 mg Subcutaneous Q24H  . gabapentin  400 mg Oral TID  . insulin aspart  0-20 Units Subcutaneous TID WC  . insulin aspart  0-5 Units Subcutaneous QHS  . insulin glargine  10 Units Subcutaneous QHS  . ipratropium-albuterol  3 mL Nebulization Q4H  . mometasone-formoterol  2 puff Inhalation BID  . [START ON 06/06/2014] predniSONE  60 mg Oral Q breakfast  . sertraline  25 mg Oral Daily  . simvastatin  10 mg Oral q1800  . sodium chloride  3 mL Intravenous Q12H  . sodium chloride  3 mL Intravenous Q12H  . topiramate  50 mg Oral BID   Continuous Infusions: . sodium chloride 125 mL/hr at 06/05/14 1111   PRN Meds:.sodium chloride, guaifenesin, HYDROcodone-acetaminophen, sodium chloride Assessment/Plan: [name] is a 43yo woman w/ PMHx of asthma, Type 2 DM, HTN, HLD, and migraines who presented to the ED with a 2 day history of SOB, wheezing, coughing, and chest tightness likely 2/2 asthma exacerbation.   1. Asthma Exacerbation: Patient presenting with SOB, coughing,  wheezing, and chest tightness. She tried her home Albuterol and nebulizer with minimal relief. Patient reports history of sick contacts, possible etiology of exacerbation. Her last asthma exacerbation was over a year ago and she states she does not need to use her inhaler very often. Patient with significant wheezing this AM compared to yesterday. Will continue Duonebs and prednisone with eventual taper.  - f/u peak flow  -  Incentive spirometry  - Continue Duonebs Q4H  - Continue Dulera BID  - Prednisone 60 mg starting today for 3 days, then begin taper   - Cardiac monitoring  - Respiratory viral panel pending  2. Prolonged QTc on EKG: Patient with QTc of 626 on admission. Previous EKG on 03/05/14 shows QTc of 445. Patient received Magnesium 2 g. Repeat EKG shows QTc 477.  - Continue to monitor - Avoid prescribing QT prolonging medications  3. HTN: BP 146/69 on admission. Now in 110s-130s/60-90s. Patient takes Amlodipine 10 mg daily, Lotensin 20-25 mg daily, Coreg 3.125 mg BID, and HCTZ 12.5 mg daily.  - Holding BP meds for now - Monitor VS   4. Type 2 DM: Last HbA1c 9.4 on 02/27/14. Patient takes Lantus 10 units QHS and NPH 50 units BID with meals. HbA1c 9.3. - Continue home Lantus 10 units QHS  - ISS  - CBGs 4 times daily   5. Hx of Migraines: Patient takes Imitrex 25 mg every 2 hours as needed for migraines at home.  - Topamax 50 mg BID   6. HLD: Patient takes Simvastatin 10 mg daily at home.  - Continue Simvastatin   7. Chronic Back Pain: Patient takes Gabapentin 400 mg TID and Norco 5-325 mg Q6H PRN for back pain.  - Continue Gabapentin 400 mg TID  - Continue Norco 5-325 mg Q6H PRN   Diet: Heart healthy/Carb modified  DVT/PE PPx: Lovenox SQ  Dispo: Disposition is deferred at this time, awaiting improvement of current medical problems. Anticipated discharge in approximately 1-2 day(s).   The patient does have a current PCP Lance Bosch, NP) and does need an [facility] hospital  follow-up appointment after discharge.  The patient does not have transportation limitations that hinder transportation to clinic appointments.  .Services Needed at time of discharge: Y = Yes, Blank = No PT:   OT:   RN:   Equipment:   Other:     LOS: 1 day   Albin Felling, MD 06/05/2014, 1:53 PM

## 2014-06-05 NOTE — Progress Notes (Signed)
Inpatient Diabetes Program Recommendations  AACE/ADA: New Consensus Statement on Inpatient Glycemic Control (2013)  Target Ranges:  Prepandial:   less than 140 mg/dL      Peak postprandial:   less than 180 mg/dL (1-2 hours)      Critically ill patients:  140 - 180 mg/dL     Results for [name], [name] (MRN [id number]) as of 06/05/2014 13:58  Ref. Range 06/05/2014 08:13 06/05/2014 11:47  Glucose-Capillary Latest Range: 70-99 mg/dL 300 (H) 238 (H)     Home DM meds:  Lantus 10 units QHS  70/30 insulin- 50 units bidwc Farxiga 5 mg QD   Current Orders: Lantus 10 units QHS Novolog Resistant SSI tid ac + HS   Patient got one dose IV Solumedrol yesterday at 12PM.  Now getting Prednisone 60 mg daily.  Patient eating 100% of meals.  Patient known to the Inpatient DM Program.  This DM Coordinator spoke with patient about her DM care at home back on 02/28/14 (see chart review for details of conversation).    MD- Please consider the following: Start 1/2 patient's home dose of 70/30 insulin- 25 units bid with meals    Will follow Wyn Quaker RN, MSN, CDE Diabetes Coordinator Inpatient Diabetes Program Team Pager: [phone] (8a-10p)

## 2014-06-05 NOTE — Progress Notes (Signed)
Pt seen and examined with Dr. Arcelia Jew. Please refer to resident note for details.   In brief, pt is a 43 y/o female with with PMH of DM, HTN, HLD, chronic back pain, migraines who p/w SOB, wheezing * 2 days. Pt states that the day prior to admission she started developing SOB, wheezing assoc with chest tightness and cough. Pt states that she took her albuterol inhaler with no relief and came to the hospital for further Rx. She denies fevers/chills, no palpitiations, no diaphoresis, no n/v, no diarrhea, no abd pain, no focal weakness. Remaining ROS negative  Pt still with wheezing today although she states SOB has improved.  Exam: Lungs: b/l exp wheeze + Cardio: RRR, normal heart sounds Abd: soft, non tender, BS + Ext: No pedal edema Gen: AAO*3, mildly tachypnneic  Assessment and Plan: 43 y/o female with acute asthma exacerbation  Acute asthma exacerbation: - c/w duonebs q 4 hrs - Monitor peak flow daily - C/w prednisone 60 mg daily - c/w dulera while in hospital. Resume advair on d.c - f/u resp viral panel  DM: - On lantus 10 units - would consider restarting NPH 25 units bid for now  HTN: - Would resume norvasc and lotensin in AM. Hold coreg for now

## 2014-06-06 LAB — CULTURE, GROUP A STREP

## 2014-06-06 LAB — BASIC METABOLIC PANEL
ANION GAP: 14 (ref 5–15)
BUN: 12 mg/dL (ref 6–23)
CO2: 18 meq/L — AB (ref 19–32)
Calcium: 8.7 mg/dL (ref 8.4–10.5)
Chloride: 108 mEq/L (ref 96–112)
Creatinine, Ser: 0.93 mg/dL (ref 0.50–1.10)
GFR calc non Af Amer: 74 mL/min — ABNORMAL LOW (ref >90)
GFR, EST AFRICAN AMERICAN: 85 mL/min — AB (ref >90)
Glucose, Bld: 189 mg/dL — ABNORMAL HIGH (ref 70–99)
POTASSIUM: 4.2 meq/L (ref 3.7–5.3)
Sodium: 140 mEq/L (ref 137–147)

## 2014-06-06 LAB — CBC
HEMATOCRIT: 34.7 % — AB (ref 36.0–46.0)
HEMOGLOBIN: 11.5 g/dL — AB (ref 12.0–15.0)
MCH: 28.8 pg (ref 26.0–34.0)
MCHC: 33.1 g/dL (ref 30.0–36.0)
MCV: 87 fL (ref 78.0–100.0)
Platelets: 316 10*3/uL (ref 150–400)
RBC: 3.99 MIL/uL (ref 3.87–5.11)
RDW: 14.9 % (ref 11.5–15.5)
WBC: 15 10*3/uL — ABNORMAL HIGH (ref 4.0–10.5)

## 2014-06-06 LAB — GLUCOSE, CAPILLARY
GLUCOSE-CAPILLARY: 149 mg/dL — AB (ref 70–99)
Glucose-Capillary: 152 mg/dL — ABNORMAL HIGH (ref 70–99)

## 2014-06-06 MED ORDER — FLUTICASONE-SALMETEROL 250-50 MCG/DOSE IN AEPB: 1.0000 | INHALATION_SPRAY | Freq: Two times a day (BID) | RESPIRATORY_TRACT | Status: DC

## 2014-06-06 MED ORDER — PREDNISONE 10 MG PO TABS: ORAL_TABLET | ORAL | Status: DC

## 2014-06-06 NOTE — Discharge Instructions (Signed)
It was a pleasure taking care of you, [name].  Please start using your Advair inhaler every day. You should be taking 2 puffs twice a day every day. Use your albuterol inhaler as needed.   Please follow up with your primary care physician this Friday, 9/18.   Dr. Arcelia Jew   Asthma Asthma is a condition of the lungs in which the airways tighten and narrow. Asthma can make it hard to breathe. Asthma cannot be cured, but medicine and lifestyle changes can help control it. Asthma may be started (triggered) by:  Animal skin flakes (dander).  Dust.  Cockroaches.  Pollen.  Mold.  Smoke.  Cleaning products.  Hair sprays or aerosol sprays.  Paint fumes or strong smells.  Cold air, weather changes, and winds.  Crying or laughing hard.  Stress.  Certain medicines or drugs.  Foods, such as dried fruit, potato chips, and sparkling grape juice.  Infections or conditions (colds, flu).  Exercise.  Certain medical conditions or diseases.  Exercise or tiring activities. HOME CARE   Take medicine as told by your doctor.  Use a peak flow meter as told by your doctor. A peak flow meter is a tool that measures how well the lungs are working.  Record and keep track of the peak flow meter's readings.  Understand and use the asthma action plan. An asthma action plan is a written plan for taking care of your asthma and treating your attacks.  To help prevent asthma attacks:  Do not smoke. Stay away from secondhand smoke.  Change your heating and air conditioning filter often.  Limit your use of fireplaces and wood stoves.  Get rid of pests (such as roaches and mice) and their droppings.  Throw away plants if you see mold on them.  Clean your floors. Dust regularly. Use cleaning products that do not smell.  Have someone vacuum when you are not home. Use a vacuum cleaner with a HEPA filter if possible.  Replace carpet with wood, tile, or vinyl flooring. Carpet can trap  animal skin flakes and dust.  Use allergy-proof pillows, mattress covers, and box spring covers.  Wash bed sheets and blankets every week in hot water and dry them in a dryer.  Use blankets that are made of polyester or cotton.  Clean bathrooms and kitchens with bleach. If possible, have someone repaint the walls in these rooms with mold-resistant paint. Keep out of the rooms that are being cleaned and painted.  Wash hands often. GET HELP IF:  You have make a whistling sound when breaking (wheeze), have shortness of breath, or have a cough even if taking medicine to prevent attacks.  The colored mucus you cough up (sputum) is thicker than usual.  The colored mucus you cough up changes from clear or white to yellow, green, gray, or bloody.  You have problems from the medicine you are taking such as:  A rash.  Itching.  Swelling.  Trouble breathing.  You need reliever medicines more than 2-3 times a week.  Your peak flow measurement is still at 50-79% of your personal best after following the action plan for 1 hour.  You have a fever. GET HELP RIGHT AWAY IF:   You seem to be worse and are not responding to medicine during an asthma attack.  You are short of breath even at rest.  You get short of breath when doing very little activity.  You have trouble eating, drinking, or talking.  You have chest pain.  You have a fast heartbeat.  Your lips or fingernails start to turn blue.  You are light-headed, dizzy, or faint.  Your peak flow is less than 50% of your personal best. MAKE SURE YOU:   Understand these instructions.  Will watch your condition.  Will get help right away if you are not doing well or get worse. Document Released: 02/25/2008 Document Revised: 01/23/2014 Document Reviewed: 04/07/2013 [facility] Patient Information 2015 [place], [name]. This information is not intended to replace advice given to you by your health care provider. Make sure you  discuss any questions you have with your health care provider.  Fluticasone; Salmeterol inhalation aerosol What is this medicine? FLUTICASONE; SALMETEROL (floo TIK a sone; sal ME te role) inhalation is a combination of two medicines that decrease inflammation and help to open up the airways of your lungs. It is used to treat asthma. Do NOT use for an acute asthma attack. This medicine may be used for other purposes; ask your health care provider or pharmacist if you have questions. COMMON BRAND NAME(S): Advair HFA What should I tell my health care provider before I take this medicine? They need to know if you have any of these conditions: -bone problems -immune system problems -diabetes -heart disease or irregular heartbeat -high blood pressure -infection -pheochromocytoma -seizures -thyroid disease -worsening asthma -an unusual or allergic reaction to fluticasone; salmeterol, other corticosteroids, other medicines, foods, dyes, or preservatives -pregnant or trying to get pregnant -breast-feeding How should I use this medicine? This medicine is inhaled through the mouth. Follow the directions on the prescription label. Shake well for 5 seconds before each use. After using the inhaler, rinse your mouth with water. Make sure not to swallow the water. Take your medicine at regular intervals. Do not take your medicine more often than directed. Do not stop taking except on your doctor's advice. Make sure that you are using your inhaler correctly. Ask you doctor or health care provider if you have any questions. A special MedGuide will be given to you by the pharmacist with each prescription and refill. Be sure to read this information carefully each time. Talk to your pediatrician regarding the use of this medicine in children. While this drug may be prescribed for children as young as 104 years of age for selected conditions, precautions do apply. Overdosage: If you think you have taken too much  of this medicine contact a poison control center or emergency room at once. NOTE: This medicine is only for you. Do not share this medicine with others. What if I miss a dose? If you miss a dose, use it as soon as you remember. If it is almost time for your next dose, use only that dose and continue with your regular schedule, spacing doses evenly. Do not use double or extra doses. What may interact with this medicine? Do not take this medicine with any of the following medications: -MAOIs like Carbex, Eldepryl, Marplan, Nardil, and Parnate This medicine may also interact with the following medications: -aminophylline or theophylline -antiviral medicines for HIV or AIDS -diuretics -medicines for colds -medicines for depression or emotional conditions -medicines for fungal infections like ketoconazole and itraconazole -medicines for the heart like metoprolol, propanolol -medicines for weight loss including some herbal products -other medicine for breathing problems -pimozide -some antibiotics like clarithromycin, erythromycin, levofloxacin, linezolid, and telithromycin -vaccines This list may not describe all possible interactions. Give your health care provider a list of all the medicines, herbs, non-prescription drugs, or dietary supplements you  use. Also tell them if you smoke, drink alcohol, or use illegal drugs. Some items may interact with your medicine. What should I watch for while using this medicine? Visit your doctor for regular check ups. Tell your doctor or health care professional if your symptoms do not get better. If your symptoms get worse or if you need your short-acting inhalers more often, call your doctor right away. Do not use this medicine more than every 12 hours. If you have asthma, be aware that using this medicine may increase your risk of dying from asthma-related problems. Talk to your doctor about the risks and benefits of taking this medicine. NEVER use this  medicine for an acute asthma attack. This medicine may increase your risk of getting an infection. Tell your doctor or health care professional if you are around anyone with measles or chickenpox, or if you develop sores or blisters that do not heal properly. What side effects may I notice from receiving this medicine? Side effects that you should report to your doctor or health care professional as soon as possible: -allergic reactions like skin rash or hives, swelling of the face, lips, or tongue -changes in vision -chest pain -feeling faint or lightheaded, falls -fever or chills -irregular heartbeat Side effects that usually do not require medical attention (report to your doctor or health care professional if they continue or are bothersome): -coughing, hoarseness, throat irritation -headache -nervousness -stomach problems -stuffy nose -tremors This list may not describe all possible side effects. Call your doctor for medical advice about side effects. You may report side effects to FDA at 1-800-FDA-1088. Where should I keep my medicine? Keep out of the reach of children. Store at room temperature between 15 and 30 degrees C (59 and 86 degrees F). Store inhaler with the mouthpiece down. Keep track of the number of doses used. Throw away the inhaler after 120 inhalations or after the expiration date, whichever comes first. NOTE: This sheet is a summary. It may not cover all possible information. If you have questions about this medicine, talk to your doctor, pharmacist, or health care provider.  2015, [employer]. (2013-01-12 08:09:33)

## 2014-06-06 NOTE — Progress Notes (Signed)
Subjective: Patient feeling much better today. She states her SOB and wheezing have improved.   Objective: Vital signs in last 24 hours: Filed Vitals:   06/05/14 2357 06/06/14 0419 06/06/14 0542 06/06/14 0826  BP:   130/91   Pulse: 84 88 84   Temp:   98.2 F (36.8 C)   TempSrc:   Oral   Resp: 20 20 18    Height:      Weight:      SpO2: 100%  100% 96%   Weight change:   Intake/Output Summary (Last 24 hours) at 06/06/14 0854 Last data filed at 06/06/14 0600  Gross per 24 hour  Intake   1630 ml  Output   3000 ml  Net  -1370 ml   Physical Exam: General: alert, pleasant, NAD HEENT: [place]/AT, EOMI, mucus membranes moist CV: RRR, normal S1/S2, no m/g/r Pulm: CTA bilaterally, minimal expiratory wheezes, breaths non-labored on room air Abd: BS+, soft, non-tender Ext: warm, no edema, moves all Neuro: alert and oriented x 3, CNs II-XII intact  Lab Results: Basic Metabolic Panel:  Recent Labs Lab 06/04/14 1159  06/05/14 0347 06/05/14 0956  NA  --   < > 134* 136*  K  --   < > 5.6* 4.3  CL  --   < > 102 103  CO2  --   < > 18* 20  GLUCOSE  --   < > 297* 257*  BUN  --   < > 13 12  CREATININE  --   < > 0.87 0.82  CALCIUM  --   < > 9.0 9.0  MG 1.5  --   --   --   < > = values in this interval not displayed. Liver Function Tests: No results found for this basename: AST, ALT, ALKPHOS, BILITOT, PROT, ALBUMIN,  in the last 168 hours No results found for this basename: LIPASE, AMYLASE,  in the last 168 hours No results found for this basename: AMMONIA,  in the last 168 hours CBC:  Recent Labs Lab 06/04/14 1159 06/04/14 1215 06/06/14 0649  WBC 10.3  --  15.0*  HGB 11.8* 12.6 11.5*  HCT 34.5* 37.0 34.7*  MCV 85.4  --  87.0  PLT 278  --  316   Cardiac Enzymes: No results found for this basename: CKTOTAL, CKMB, CKMBINDEX, TROPONINI,  in the last 168 hours BNP: No results found for this basename: PROBNP,  in the last 168 hours D-Dimer: No results found for this  basename: DDIMER,  in the last 168 hours CBG:  Recent Labs Lab 06/04/14 2119 06/05/14 0813 06/05/14 1147 06/05/14 1731 06/05/14 2227 06/06/14 0824  GLUCAP 386* 300* 238* 300* 238* 149*   Hemoglobin A1C:  Recent Labs Lab 06/04/14 1939  HGBA1C 9.3*   Fasting Lipid Panel: No results found for this basename: CHOL, HDL, LDLCALC, TRIG, CHOLHDL, LDLDIRECT,  in the last 168 hours Thyroid Function Tests: No results found for this basename: TSH, T4TOTAL, FREET4, T3FREE, THYROIDAB,  in the last 168 hours Coagulation: No results found for this basename: LABPROT, INR,  in the last 168 hours Anemia Panel: No results found for this basename: VITAMINB12, FOLATE, FERRITIN, TIBC, IRON, RETICCTPCT,  in the last 168 hours Urine Drug Screen: Drugs of Abuse     Component Value Date/Time   LABOPIA NONE DETECTED 05/31/2008 [place] 05/31/2008 [place] 05/31/2008 [place] 05/31/2008 [place] 05/31/2008  0439   LABBARB  Value: NONE DETECTED        DRUG SCREEN FOR MEDICAL PURPOSES ONLY.  IF CONFIRMATION IS NEEDED FOR ANY PURPOSE, NOTIFY LAB WITHIN 5 DAYS. 05/31/2008 0439    Alcohol Level: No results found for this basename: ETH,  in the last 168 hours Urinalysis:  Recent Labs Lab 06/05/14 1100  [place] 1.015  PHURINE 6.0  GLUCOSEU >1000*  HGBUR NEGATIVE  BILIRUBINUR NEGATIVE  KETONESUR NEGATIVE  PROTEINUR NEGATIVE  UROBILINOGEN 0.2  NITRITE NEGATIVE  LEUKOCYTESUR NEGATIVE    Micro Results: Recent Results (from the past 240 hour(s))  RAPID STREP SCREEN     Status: None   Collection Time    06/04/14 12:01 PM      Result Value Ref Range Status   Streptococcus, Group A Screen (Direct) NEGATIVE  NEGATIVE Final   Comment: (NOTE)     A Rapid Antigen test may result negative if the antigen level in the     sample is below the detection level of this test. The FDA has not     cleared this test as  a stand-alone test therefore the rapid antigen     negative result has reflexed to a Group A Strep culture.  CULTURE, GROUP A STREP     Status: None   Collection Time    06/04/14 12:01 PM      Result Value Ref Range Status   Specimen Description THROAT   Final   Special Requests NONE   Final   Culture     Final   Value: NO SUSPICIOUS COLONIES, CONTINUING TO HOLD     Performed at [facility]   Report Status PENDING   Incomplete  RESPIRATORY VIRUS PANEL     Status: Abnormal   Collection Time    06/04/14  5:26 PM      Result Value Ref Range Status   Source - RVPAN NASAL SWAB   Corrected   Comment: CORRECTED ON 09/14 AT 2100: PREVIOUSLY REPORTED AS NASAL SWAB   Respiratory Syncytial Virus A NOT DETECTED   Final   Respiratory Syncytial Virus B NOT DETECTED   Final   Influenza A NOT DETECTED   Final   Influenza B NOT DETECTED   Final   Parainfluenza 1 NOT DETECTED   Final   Parainfluenza 2 NOT DETECTED   Final   Parainfluenza 3 NOT DETECTED   Final   Metapneumovirus NOT DETECTED   Final   Rhinovirus DETECTED (*)  Final   Adenovirus NOT DETECTED   Final   Influenza A H1 NOT DETECTED   Final   Influenza A H3 NOT DETECTED   Final   Comment: (NOTE)           Normal Reference Range for each Analyte: NOT DETECTED     Testing performed using the Luminex xTAG Respiratory Viral Panel test     kit.     The analytical performance characteristics of this assay have been     determined by [facility].  The modifications have not been     cleared or approved by the FDA. This assay has been validated pursuant     to the CLIA regulations and is used for clinical purposes.     Performed at [facility]   Studies/Results: Dg Chest 2 View  06/04/2014   CLINICAL DATA:  Shortness of breath.  History of asthma.  EXAM: CHEST  2 VIEW  COMPARISON:  Chest radiograph  02/26/2014 and chest CT 11/10/2013  FINDINGS: Large habitus. Portion of the abdominal pannus projects over the lower  chest. Cardiothymic silhouette appears stable. Heart size appears borderline enlarged by chest radiograph. Pulmonary vascularity is normal. Lung volumes are slightly low. Lungs appear clear. Negative for pleural effusion or pneumothorax. Visualized bony thorax is intact.  IMPRESSION: No acute cardiopulmonary disease identified.   Electronically Signed   By: Curlene Dolphin M.D.   On: 06/04/2014 14:08   Medications: I have reviewed the patient's current medications. Scheduled Meds: . aspirin EC  325 mg Oral Daily  . enoxaparin (LOVENOX) injection  40 mg Subcutaneous Q24H  . gabapentin  400 mg Oral TID  . insulin aspart  0-20 Units Subcutaneous TID WC  . insulin aspart  0-5 Units Subcutaneous QHS  . insulin aspart  5 Units Subcutaneous TID WC  . insulin glargine  10 Units Subcutaneous QHS  . ipratropium-albuterol  3 mL Nebulization Q4H  . mometasone-formoterol  2 puff Inhalation BID  . predniSONE  60 mg Oral Q breakfast  . sertraline  25 mg Oral Daily  . simvastatin  10 mg Oral q1800  . sodium chloride  3 mL Intravenous Q12H  . sodium chloride  3 mL Intravenous Q12H  . topiramate  50 mg Oral BID   Continuous Infusions: . sodium chloride 125 mL/hr at 06/05/14 1111   PRN Meds:.sodium chloride, guaifenesin, HYDROcodone-acetaminophen, sodium chloride Assessment/Plan: [name] is a 44yo woman w/ PMHx of asthma, Type 2 DM, HTN, HLD, and migraines who presented to the ED with a 2 day history of SOB, wheezing, coughing, and chest tightness likely 2/2 asthma exacerbation.   1. Asthma Exacerbation: Patient presenting with SOB, coughing, wheezing, and chest tightness. She tried her home Albuterol and nebulizer with minimal relief. Patient reports history of sick contacts, possible etiology of exacerbation. Her last asthma exacerbation was over a year ago and she states she does not need to use her inhaler very often. Wheezing much improved this AM. Patient states her shortness of breath has improved  as well. Breathing comfortably on room air. Respiratory viral panel positive for rhinovirus, likely cause of asthma exacerbation.  - Discharge on home asthma meds, prednisone taper - Incentive spirometry  - Continue Duonebs Q4H  - Continue Dulera BID  - Cardiac monitoring   2. Prolonged QTc on EKG: Patient with QTc of 626 on admission. Previous EKG on 03/05/14 shows QTc of 445. Patient received Magnesium 2 g. Repeat EKG shows QTc 477.  - Continue to monitor  - Avoid prescribing QT prolonging medications   3. HTN: BP 146/69 on admission. Now in 130s-150s/60-90s. Patient takes Amlodipine 10 mg daily, Lotensin 20-25 mg daily, Coreg 3.125 mg BID, and HCTZ 12.5 mg daily.  - Resume home meds upon discharge - Monitor VS   4. Type 2 DM: Last HbA1c 9.4 on 02/27/14. Patient takes Lantus 10 units QHS and NPH 50 units BID with meals. HbA1c 9.3.  - Continue home Lantus 10 units QHS  - ISS  - CBGs 4 times daily  - Started on Novolog 5 units TID with meals yesterday, blood sugars still high in 200-300s. Will need outpatient follow up.   5. Hx of Migraines: Patient takes Imitrex 25 mg every 2 hours as needed for migraines at home.  - Topamax 50 mg BID   6. HLD: Patient takes Simvastatin 10 mg daily at home.  - Continue Simvastatin   7. Chronic Back Pain: Patient takes Gabapentin 400 mg TID and Norco  5-325 mg Q6H PRN for back pain.  - Continue Gabapentin 400 mg TID  - Continue Norco 5-325 mg Q6H PRN   Diet: Heart healthy/Carb modified  DVT/PE PPx: Lovenox SQ  Dispo: Patient to be discharged today  The patient does have a current PCP Lance Bosch, NP) and does need an [facility] hospital follow-up appointment after discharge.  The patient does not have transportation limitations that hinder transportation to clinic appointments.  .Services Needed at time of discharge: Y = Yes, Blank = No PT:   OT:   RN:   Equipment:   Other:     LOS: 2 days   Albin Felling, MD 06/06/2014, 8:54 AM

## 2014-06-06 NOTE — Discharge Summary (Signed)
Name: [name] MRN: [id number] DOB: 1971-09-11 43 y.o. PCP: Lance Bosch, NP  Date of Admission: 06/04/2014 11:33 AM Date of Discharge: 06/06/2014 Attending Physician: Aldine Contes, MD  Discharge Diagnosis: 1.  Principal Problem:   Asthma exacerbation Active Problems:   Essential hypertension, benign   Chest pain   DM type 2 (diabetes mellitus, type 2)   URI (upper respiratory infection)  Discharge Medications:   Medication List    STOP taking these medications       budesonide-formoterol 160-4.5 MCG/ACT inhaler  Commonly known as:  SYMBICORT      TAKE these medications       albuterol 108 (90 BASE) MCG/ACT inhaler  Commonly known as:  PROVENTIL HFA;VENTOLIN HFA  Inhale 2 puffs into the lungs every 6 (six) hours as needed for wheezing or shortness of breath.     albuterol (2.5 MG/3ML) 0.083% nebulizer solution  Commonly known as:  PROVENTIL  Take 2.5 mg by nebulization every 6 (six) hours as needed for wheezing or shortness of breath.     ALEVE 220 MG tablet  Generic drug:  naproxen sodium  Take 220 mg by mouth 3 (three) times daily as needed (pain).     amLODipine 10 MG tablet  Commonly known as:  NORVASC  Take 10 mg by mouth daily.     aspirin EC 325 MG tablet  Take 325 mg by mouth daily.     benazepril 20 MG tablet  Commonly known as:  LOTENSIN  Take 20 mg by mouth daily.     carvedilol 3.125 MG tablet  Commonly known as:  COREG  Take 3.125 mg by mouth 2 (two) times daily with a meal.     cyclobenzaprine 5 MG tablet  Commonly known as:  FLEXERIL  Take 5 mg by mouth 3 (three) times daily as needed for muscle spasms.     FARXIGA 5 MG Tabs  Generic drug:  Dapagliflozin Propanediol  Take 5 mg by mouth daily.     Fluticasone-Salmeterol 250-50 MCG/DOSE Aepb  Commonly known as:  ADVAIR  Inhale 1 puff into the lungs 2 times daily at 12 noon and 4 pm.     gabapentin 400 MG capsule  Commonly known as:  NEURONTIN  Take 400 mg by mouth 3  (three) times daily.     hydrochlorothiazide 25 MG tablet  Commonly known as:  HYDRODIURIL  Take 25 mg by mouth daily.     insulin NPH-regular Human (70-30) 100 UNIT/ML injection  Commonly known as:  NOVOLIN 70/30  Inject 50 Units into the skin 2 (two) times daily with a meal.     ipratropium 0.02 % nebulizer solution  Commonly known as:  ATROVENT  Take 0.5 mg by nebulization every 6 (six) hours as needed for wheezing or shortness of breath.     LANTUS SOLOSTAR 100 UNIT/ML Solostar Pen  Generic drug:  Insulin Glargine  Inject 10 Units into the skin at bedtime.     meloxicam 7.5 MG tablet  Commonly known as:  MOBIC  Take 7.5 mg by mouth daily.     ondansetron 4 MG tablet  Commonly known as:  ZOFRAN  Take 1 tablet (4 mg total) by mouth every 8 (eight) hours as needed for nausea or vomiting.     predniSONE 10 MG tablet  Commonly known as:  DELTASONE  Starting on 9/16: Take 60 mg for 1 day, 40 mg for 3 days, 30 mg for 3 days, 20 mg for 3  days, and 10 mg for 3 days.     sertraline 25 MG tablet  Commonly known as:  ZOLOFT  Take 25 mg by mouth daily.     simvastatin 10 MG tablet  Commonly known as:  ZOCOR  Take 10 mg by mouth daily at 6 PM.     SUMAtriptan 25 MG tablet  Commonly known as:  IMITREX  Take 25 mg by mouth every 2 (two) hours as needed for migraine or headache. May repeat in 2 hours if headache persists or recurs.     topiramate 50 MG tablet  Commonly known as:  TOPAMAX  Take 50 mg by mouth 2 (two) times daily.        Disposition and follow-up:   [name] was discharged from [facility] in Good condition.  At the hospital follow up visit please address:  1.  Asthma medications- Pt advised to stop Symbicort and continue taking Advair. Pt does not need to be on 2 long-acting bronchodilators. Pt had not been taking Advair BID, only PRN. She was instructed to use Advair inhaler every day and Albuterol inhaler as needed.       Diabetes-  Patient's blood sugars elevated in hospital in 200-300s. Likely due to Prednisone. Pt resumed on home meds of NPH and Lantus 10 units QHS at discharge. Consider increasing Lantus if sugars continue to run high.       Prolonged QTc- Noted on admission EKG. Consider avoiding QT prolonging drugs in future.   2.  Labs / imaging needed at time of follow-up: None  3.  Pending labs/ test needing follow-up: None  Follow-up Appointments: Follow-up Information   Follow up with Chari Manning, NP. (Go to clinic on Friday 9/18 at [facility] for hospital follow up)    Specialty:  Internal Medicine   Contact information:   [place] [address] [phone]       Discharge Instructions: Discharge Instructions   Diet - low sodium heart healthy    Complete by:  As directed      Increase activity slowly    Complete by:  As directed            Consultations:    Procedures Performed:  Dg Chest 2 View  06/04/2014   CLINICAL DATA:  Shortness of breath.  History of asthma.  EXAM: CHEST  2 VIEW  COMPARISON:  Chest radiograph 02/26/2014 and chest CT 11/10/2013  FINDINGS: Large habitus. Portion of the abdominal pannus projects over the lower chest. Cardiothymic silhouette appears stable. Heart size appears borderline enlarged by chest radiograph. Pulmonary vascularity is normal. Lung volumes are slightly low. Lungs appear clear. Negative for pleural effusion or pneumothorax. Visualized bony thorax is intact.  IMPRESSION: No acute cardiopulmonary disease identified.   Electronically Signed   By: Curlene Dolphin M.D.   On: 06/04/2014 14:08    Admission HPI: [name] is a 43yo woman w/ PMHx of asthma, Type 2 DM, HTN, HLD, chronic back pain, and migraines who presented to the ED with a 2 day history of SOB, wheezing, coughing, and chest tightness. Patient reports all of her symptoms started yesterday morning. She states she tried using her Albuterol inhaler and nebulizer twice yesterday and once  this morning with no relief. Her last asthma exacerbation was about one year ago. She states she seldom uses her albuterol inhaler. She describes her chest tightness as 9/10 in severity and substernal. She reports a sore throat and some nausea,  but no vomiting. She denies fever, chills, palpitations, back pain, and abdominal pain. She states a few family members have been sick in the household and she believes she may have caught a cold from them. She denies smoking.   In the ED, patient received Solu-medrol 125 mg IV once and ipratropium nebulizer. CXR showed no acute cardiopulmonary disease.    Hospital Course by problem list: Principal Problem:   Asthma exacerbation Active Problems:   Essential hypertension, benign   Chest pain   DM type 2 (diabetes mellitus, type 2)   URI (upper respiratory infection)   1. Asthma Exacerbation: Patient presented with SOB, coughing, wheezing, and chest tightness. She tried her home Albuterol and nebulizer with minimal relief. Patient reported history of sick contacts and found to be positive for rhinovirus on respiratory viral panel. This is likely the etiology of her exacerbation. Her last asthma exacerbation was over a year ago and she states she does not need to use her inhaler very often. On 9/14 her wheezing was significant, but after Duoneb treatments every four hours, patient's wheezing and SOB improved. Wheezing much improved this AM. Patient states her shortness of breath has improved as well. Breathing comfortably on room air. Patient instructed to start using Advair inhaler daily, with 2 puffs twice a day. She was told to stop using Symbicort and to use her Albuterol inhaler only as needed. Patient understands.   2. Prolonged QTc: Patient with QTc of 626 on admission. Previous EKG on 03/05/14 shows QTc of 445. Patient received Magnesium 2 g. Repeat EKG showed QTc 477. Recommend to avoid QT prolonging medications in the future.   3. HTN: BP was 146/69 on  admission. Her BP then dropped to 100s-110s/50-60s and her home medications were discontinued. Her BP then remained stable in 130s-150s/60-90s. Patient was discharged on her home Amlodipine 10 mg daily, Lotensin 20-25 mg daily, Coreg 3.125 mg BID, and HCTZ 12.5 mg daily.   4. Type 2 DM: Last HbA1c was 9.4 on 02/27/14 and repeat HbA1c in hospital was 9.3. Patient takes Lantus 10 units QHS and NPH 50 units BID with meals at home. Patient was continued on home Lantus 10 units QHS and started on insulin sliding scale and Novolog 5 units TID with meals. Her blood sugars were elevated in 200-300s likely due to the Prednisone. She was discharged on her home medications. If her sugars continue to remain elevated, recommend to consider increasing Lantus.   5. Hx of Migraines: Patient takes Imitrex 25 mg Q2H PRN for migraines at home. She was given Topamax 50 mg BID during her hospitalization. She was discharged on her home Imitrex.   6. HLD: Patient takes Simvastatin 10 mg daily at home. This medication was continued.   7. Chronic Back Pain: Back pain well controlled during hospitalization. Patient takes Gabapentin 400 mg TID and Norco 5-325 mg Q6H PRN. These medications were continued.    Discharge Vitals:   BP 130/91  Pulse 84  Temp(Src) 98.2 F (36.8 C) (Oral)  Resp 18  Ht 5\' 1"  (1.549 m)  Wt 236 lb 1.8 oz (107.1 kg)  BMI 44.64 kg/m2  SpO2 96% Physical Exam:  General: alert, pleasant, NAD  HEENT: [place]/AT, EOMI, mucus membranes moist  CV: RRR, normal S1/S2, no m/g/r  Pulm: CTA bilaterally, minimal expiratory wheezes, breaths non-labored on room air  Abd: BS+, soft, non-tender  Ext: warm, no edema, moves all  Neuro: alert and oriented x 3, CNs II-XII intact  Discharge Labs:  Results  for orders placed during the hospital encounter of 06/04/14 (from the past 24 hour(s))  GLUCOSE, CAPILLARY     Status: Abnormal   Collection Time    06/05/14 11:47 AM      Result Value Ref Range    Glucose-Capillary 238 (*) 70 - 99 mg/dL  GLUCOSE, CAPILLARY     Status: Abnormal   Collection Time    06/05/14  5:31 PM      Result Value Ref Range   Glucose-Capillary 300 (*) 70 - 99 mg/dL  GLUCOSE, CAPILLARY     Status: Abnormal   Collection Time    06/05/14 10:27 PM      Result Value Ref Range   Glucose-Capillary 238 (*) 70 - 99 mg/dL   Comment 1 Notify RN     Comment 2 Documented in Chart    BASIC METABOLIC PANEL     Status: Abnormal   Collection Time    06/06/14  6:49 AM      Result Value Ref Range   Sodium 140  137 - 147 mEq/L   Potassium 4.2  3.7 - 5.3 mEq/L   Chloride 108  96 - 112 mEq/L   CO2 18 (*) 19 - 32 mEq/L   Glucose, Bld 189 (*) 70 - 99 mg/dL   BUN 12  6 - 23 mg/dL   Creatinine, Ser 0.93  0.50 - 1.10 mg/dL   Calcium 8.7  8.4 - 10.5 mg/dL   GFR calc non Af Amer 74 (*) >90 mL/min   GFR calc Af Amer 85 (*) >90 mL/min   Anion gap 14  5 - 15  CBC     Status: Abnormal   Collection Time    06/06/14  6:49 AM      Result Value Ref Range   WBC 15.0 (*) 4.0 - 10.5 K/uL   RBC 3.99  3.87 - 5.11 MIL/uL   Hemoglobin 11.5 (*) 12.0 - 15.0 g/dL   HCT 34.7 (*) 36.0 - 46.0 %   MCV 87.0  78.0 - 100.0 fL   MCH 28.8  26.0 - 34.0 pg   MCHC 33.1  30.0 - 36.0 g/dL   RDW 14.9  11.5 - 15.5 %   Platelets 316  150 - 400 K/uL  GLUCOSE, CAPILLARY     Status: Abnormal   Collection Time    06/06/14  8:24 AM      Result Value Ref Range   Glucose-Capillary 149 (*) 70 - 99 mg/dL   Comment 1 Documented in Chart     Comment 2 Notify RN      Signed: Albin Felling, MD 06/06/2014, 11:29 AM    Services Ordered on Discharge: None Equipment Ordered on Discharge: None

## 2014-06-06 NOTE — Care Management Note (Signed)
    Page 1 of 1   06/06/2014     3:17:12 PM CARE MANAGEMENT NOTE 06/06/2014  Patient:  [name], [name]   Account Number:  [id number]  Date Initiated:  06/06/2014  Documentation initiated by:  Tomi Bamberger  Subjective/Objective Assessment:   dx asthma ex  admit- lives with family. pta indep.     Action/Plan:   Anticipated DC Date:  06/06/2014   Anticipated DC Plan:  [place]  CM consult  [facility]      Choice offered to / List presented to:             Status of service:  In process, will continue to follow Medicare Important Message given?  NO (If response is "NO", the following Medicare IM given date fields will be blank) Date Medicare IM given:   Medicare IM given by:   Date Additional Medicare IM given:   Additional Medicare IM given by:    Discharge Disposition:  HOME/SELF CARE  Per UR Regulation:    If discussed at Long Length of Stay Meetings, dates discussed:    Comments:  06/06/14 [place], BSN [phone] NCM spoke with patient , patient is for dc today, she is already established at the [facility] clinic.  She will need to call them in am to schedule an appt with Mateo Flow at the Clinic.  Patient will go to Clinic at dc to get her medications, this is where she gets all her medications. NCM called over to the clinic and spoke to the pharmacist , they stated they will give patient a advair sample and sign her up for the pass program for this particular medicaiton.

## 2014-06-06 NOTE — Progress Notes (Signed)
[name] discharged Home with usband per MD order.  Discharge instructions reviewed and discussed with the patient, all questions and concerns answered. Copy of instructions and care notes given to patient.    Medication List    STOP taking these medications       budesonide-formoterol 160-4.5 MCG/ACT inhaler  Commonly known as:  SYMBICORT      TAKE these medications       albuterol 108 (90 BASE) MCG/ACT inhaler  Commonly known as:  PROVENTIL HFA;VENTOLIN HFA  Inhale 2 puffs into the lungs every 6 (six) hours as needed for wheezing or shortness of breath.     albuterol (2.5 MG/3ML) 0.083% nebulizer solution  Commonly known as:  PROVENTIL  Take 2.5 mg by nebulization every 6 (six) hours as needed for wheezing or shortness of breath.     ALEVE 220 MG tablet  Generic drug:  naproxen sodium  Take 220 mg by mouth 3 (three) times daily as needed (pain).     amLODipine 10 MG tablet  Commonly known as:  NORVASC  Take 10 mg by mouth daily.     aspirin EC 325 MG tablet  Take 325 mg by mouth daily.     benazepril 20 MG tablet  Commonly known as:  LOTENSIN  Take 20 mg by mouth daily.     carvedilol 3.125 MG tablet  Commonly known as:  COREG  Take 3.125 mg by mouth 2 (two) times daily with a meal.     cyclobenzaprine 5 MG tablet  Commonly known as:  FLEXERIL  Take 5 mg by mouth 3 (three) times daily as needed for muscle spasms.     FARXIGA 5 MG Tabs  Generic drug:  Dapagliflozin Propanediol  Take 5 mg by mouth daily.     Fluticasone-Salmeterol 250-50 MCG/DOSE Aepb  Commonly known as:  ADVAIR  Inhale 1 puff into the lungs 2 times daily at 12 noon and 4 pm.     gabapentin 400 MG capsule  Commonly known as:  NEURONTIN  Take 400 mg by mouth 3 (three) times daily.     hydrochlorothiazide 25 MG tablet  Commonly known as:  HYDRODIURIL  Take 25 mg by mouth daily.     insulin NPH-regular Human (70-30) 100 UNIT/ML injection  Commonly known as:  NOVOLIN 70/30  Inject 50  Units into the skin 2 (two) times daily with a meal.     ipratropium 0.02 % nebulizer solution  Commonly known as:  ATROVENT  Take 0.5 mg by nebulization every 6 (six) hours as needed for wheezing or shortness of breath.     LANTUS SOLOSTAR 100 UNIT/ML Solostar Pen  Generic drug:  Insulin Glargine  Inject 10 Units into the skin at bedtime.     meloxicam 7.5 MG tablet  Commonly known as:  MOBIC  Take 7.5 mg by mouth daily.     ondansetron 4 MG tablet  Commonly known as:  ZOFRAN  Take 1 tablet (4 mg total) by mouth every 8 (eight) hours as needed for nausea or vomiting.     predniSONE 10 MG tablet  Commonly known as:  DELTASONE  Starting on 9/16: Take 60 mg for 1 day, 40 mg for 3 days, 30 mg for 3 days, 20 mg for 3 days, and 10 mg for 3 days.     sertraline 25 MG tablet  Commonly known as:  ZOLOFT  Take 25 mg by mouth daily.     simvastatin 10 MG tablet  Commonly  known as:  ZOCOR  Take 10 mg by mouth daily at 6 PM.     SUMAtriptan 25 MG tablet  Commonly known as:  IMITREX  Take 25 mg by mouth every 2 (two) hours as needed for migraine or headache. May repeat in 2 hours if headache persists or recurs.     topiramate 50 MG tablet  Commonly known as:  TOPAMAX  Take 50 mg by mouth 2 (two) times daily.        Patients skin is clean, dry and intact, no evidence of skin break down. IV site discontinued and catheter remains intact. Site without signs and symptoms of complications. Dressing and pressure applied.  Patient escorted to car by NT in a wheelchair,  no distress noted upon discharge.  [name], [name] 06/06/2014 3:44 PM

## 2014-06-06 NOTE — Progress Notes (Signed)
Pt seen and examined with Dr. Arcelia Jew. Please refer to resident note for details.   Pt feels better today. Wheezing has improved. Decreased SOB. Still with mild cough  Exam:  Lungs: scattered exp wheeze + (much improved) Cardio: RRR, normal heart sounds  Abd: soft, non tender, BS +  Ext: No pedal edema  Gen: AAO*3, NAD  Assessment and Plan:  43 y/o female with acute asthma exacerbation   Acute asthma exacerbation:  - c/w albuterol prn at home - Monitor peak flow daily  - C/w prednisone taper - resume home meds  - resp viral panel with + rhinovirus  DM:  - Restart home meds on d/c. -Outpatient f/u with PCP  HTN:  - Resume home meds today  - Pt stable for d/c home today

## 2014-06-06 NOTE — Discharge Summary (Signed)
INTERNAL MEDICINE ATTENDING DISCHARGE COSIGN   I evaluated the patient on the day of discharge and discussed the discharge plan with my resident team. I agree with the discharge documentation and disposition.   Frannie Shedrick 06/06/2014, 2:01 PM

## 2014-06-08 ENCOUNTER — Ambulatory Visit: Payer: Self-pay | Admitting: Internal Medicine

## 2014-06-10 IMAGING — CT CT ABD-PELV W/ CM
3 of 13 series · 10 of 46 positions shown, 16 images · IV contrast (APPLIED)
Comparison: MR KAXELO SPINE W/O dated 09/30/2013; CT PELVIS W/CM dated
09/05/2009

CLINICAL DATA: Chest pain and abdominal pain.

EXAM:
CT ABDOMEN AND PELVIS WITH CONTRAST
TECHNIQUE: Multidetector CT imaging of the abdomen and pelvis was performed
using the standard protocol following bolus administration of
intravenous contrast.
CONTRAST:  100mL OMNIPAQUE IOHEXOL 350 MG/ML SOLN, 60mL OMNIPAQUE
IOHEXOL 300 MG/ML SOLN

[Series 7: thins · axial · 0.97mm/px · z∈[-177,-132]mm · 3 of 179 slices shown]
[im 15/179  soft-tissue]
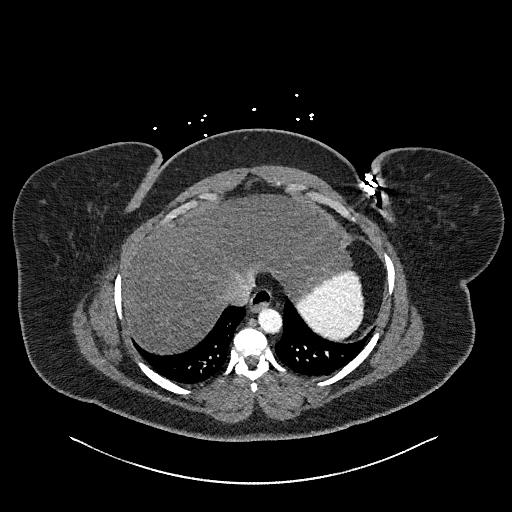
[im 45/179  soft-tissue]
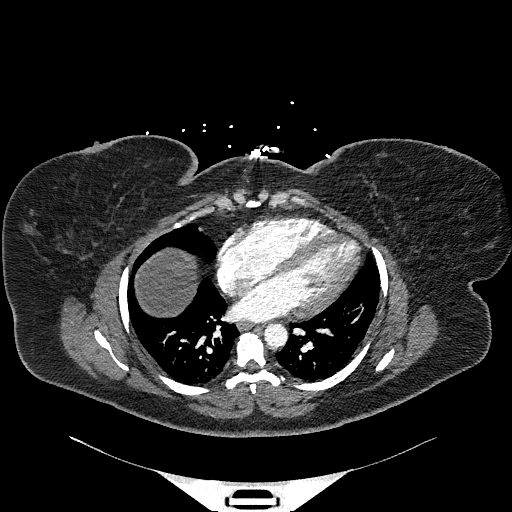
[im 60/179  soft-tissue]
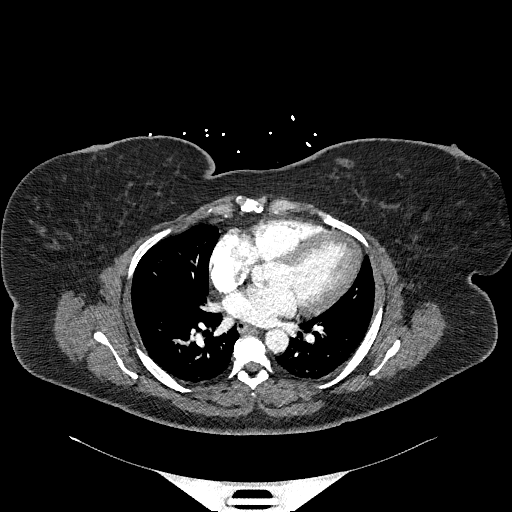

[Series 9: coronal mpr · coronal · 0.39mm/px · 2 of 113 slices shown, 3 images]
[im 38/113  soft-tissue]
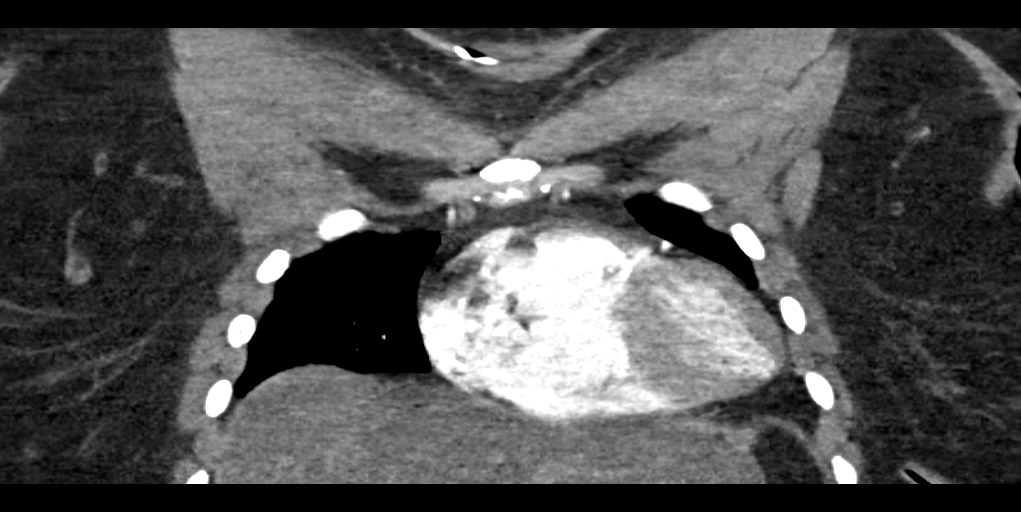
[im 38/113  bone]
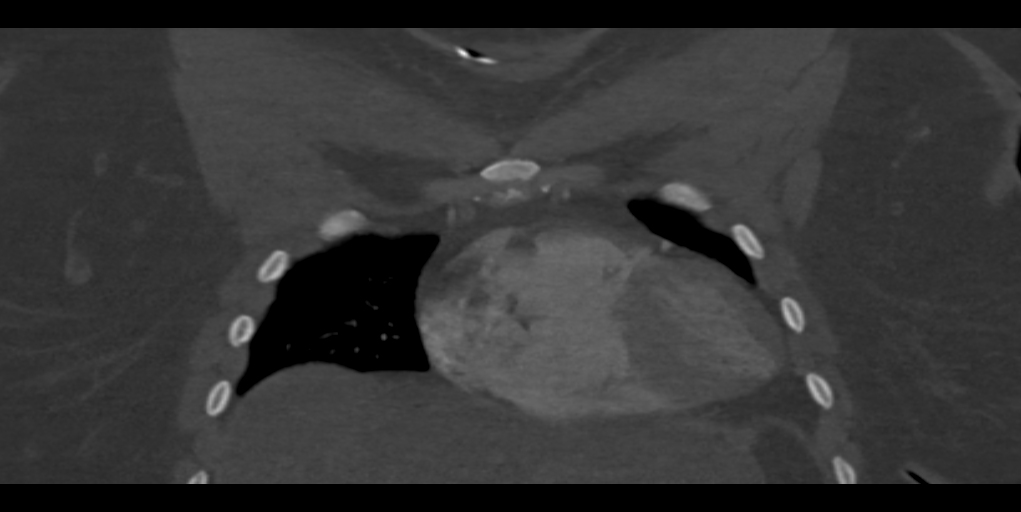
[im 75/113  soft-tissue]
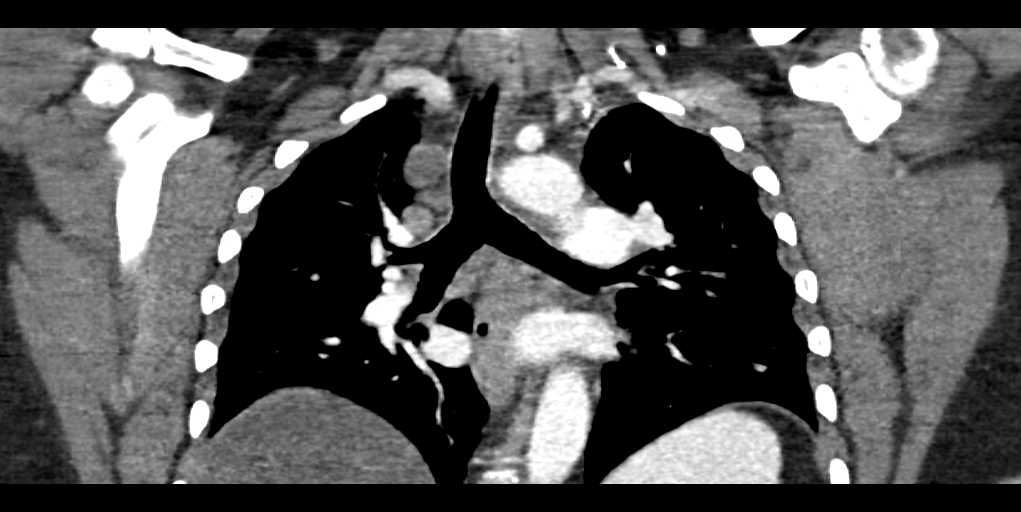

[Series 14: abd/ pelvis 5.0 i30f 1 · axial · 0.77mm/px · z∈[-485,-195]mm · 5 of 88 slices shown, 10 images]
[im 15/88  soft-tissue]
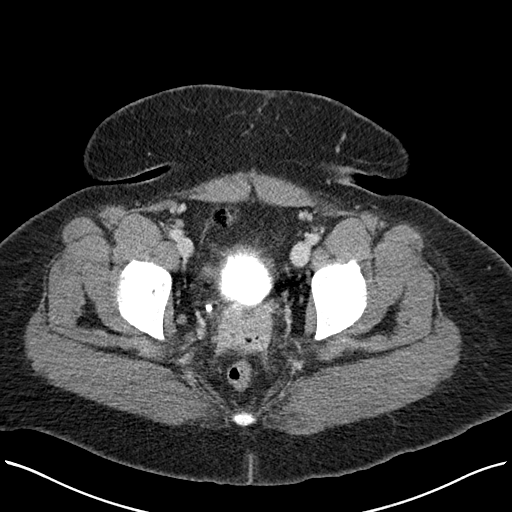
[im 15/88  bone]
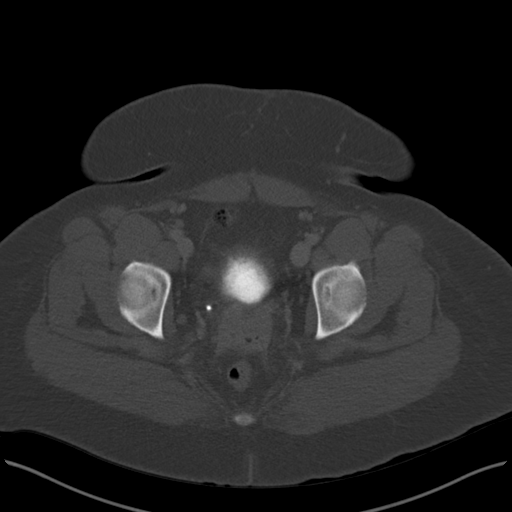
[im 30/88  soft-tissue]
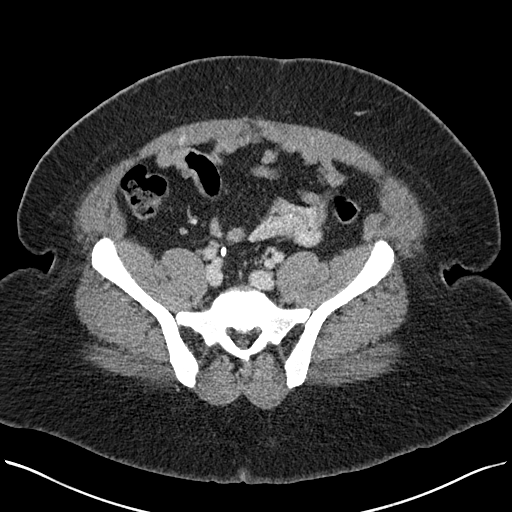
[im 30/88  lung]
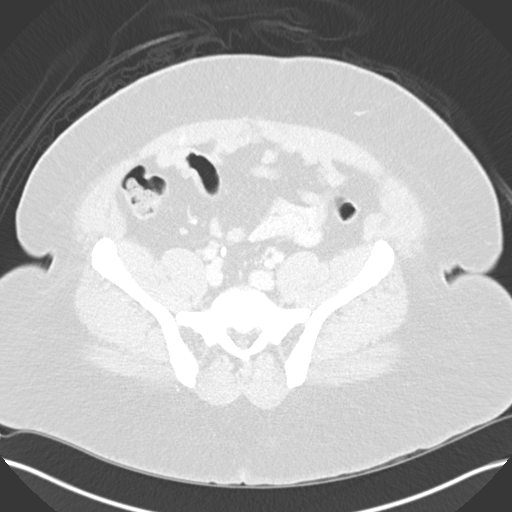
[im 44/88  soft-tissue]
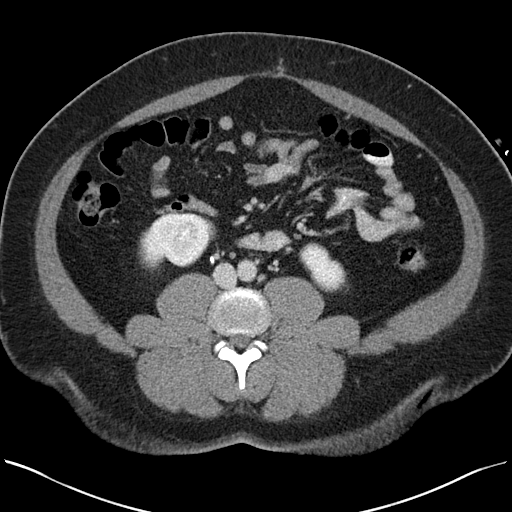
[im 44/88  lung]
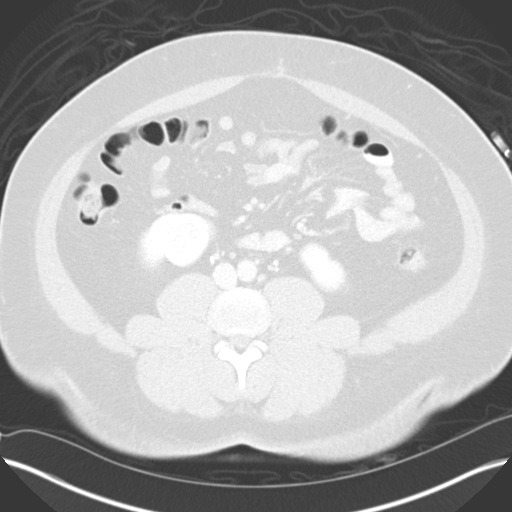
[im 59/88  soft-tissue]
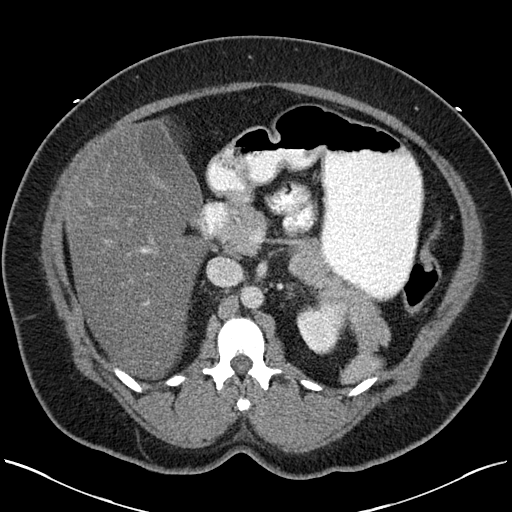
[im 59/88  lung]
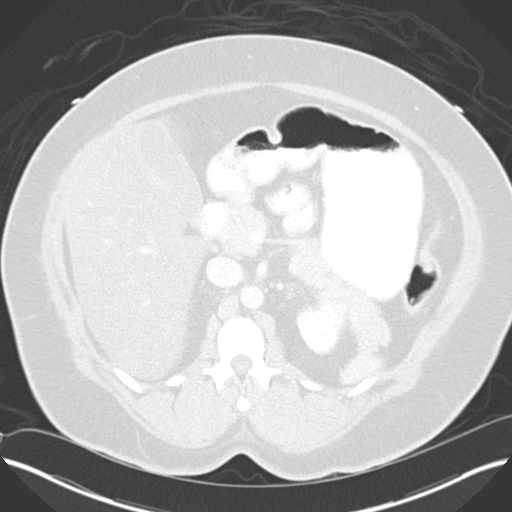
[im 73/88  soft-tissue]
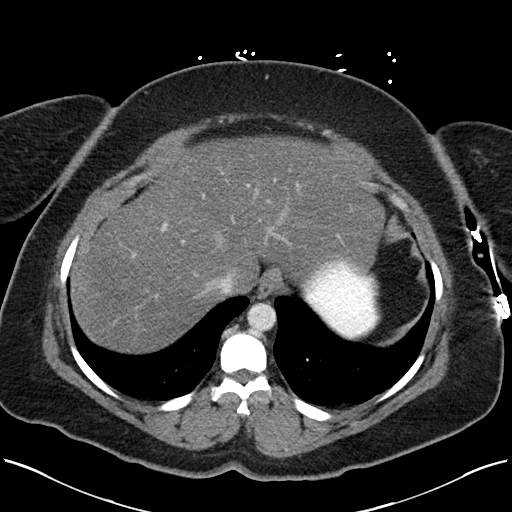
[im 73/88  lung]
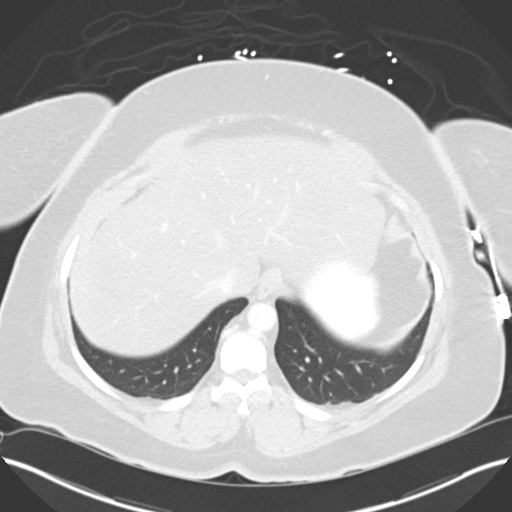

[10 of 46 positions shown; findings below may reference images not displayed]

FINDINGS: Lung bases are clear.  No pericardial fluid.

No focal hepatic lesion. There is diffuse low-attenuation within the
liver. The gallbladder, pancreas, spleen, adrenal glands, and
kidneys are normal.

The stomach, small bowel, and cecum are normal. Appendix is not
identified but there are no secondary signs of acute appendicitis.
Colon and rectosigmoid colon are normal.

Abdominal aorta is normal caliber. No retroperitoneal periportal
lymphadenopathy. No mesenteric adenopathy.

No free fluid the pelvis. Post hysterectomy anatomy. The ovaries are
normal. No pelvic lymphadenopathy. No aggressive osseous lesion.
IMPRESSION: 1. No acute findings in the abdomen or pelvis.
2. Hepatic steatosis.
3. Hysterectomy

## 2014-06-10 IMAGING — CR DG CHEST 2V
2 series · 2 of 2 positions shown · non-contrast
Comparison: Chest x-ray 04/27/2013.

CLINICAL DATA: Asthma.  Chest pain.  Shortness of breath.

EXAM:
CHEST  2 VIEW

[w chest pa]
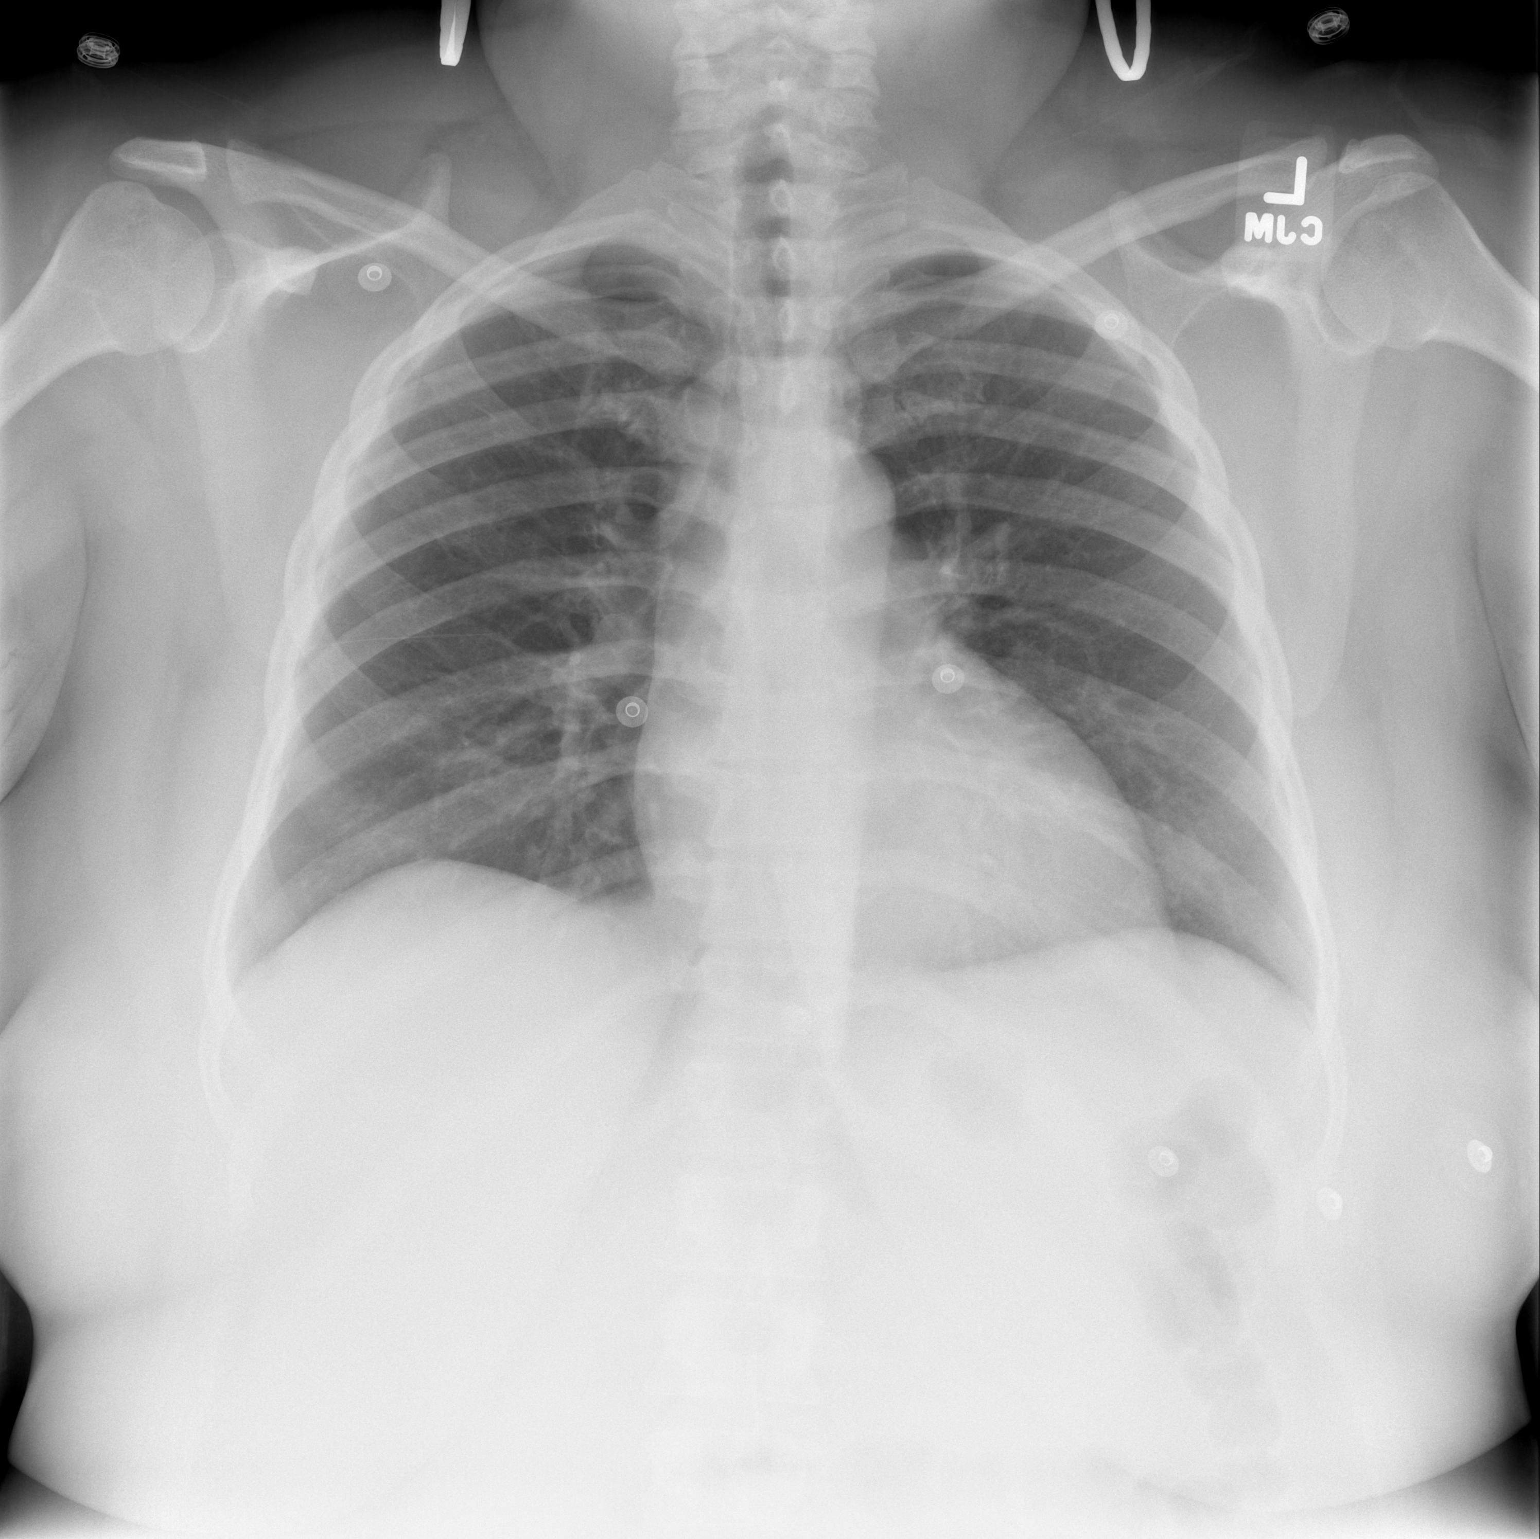

[w chest lat]
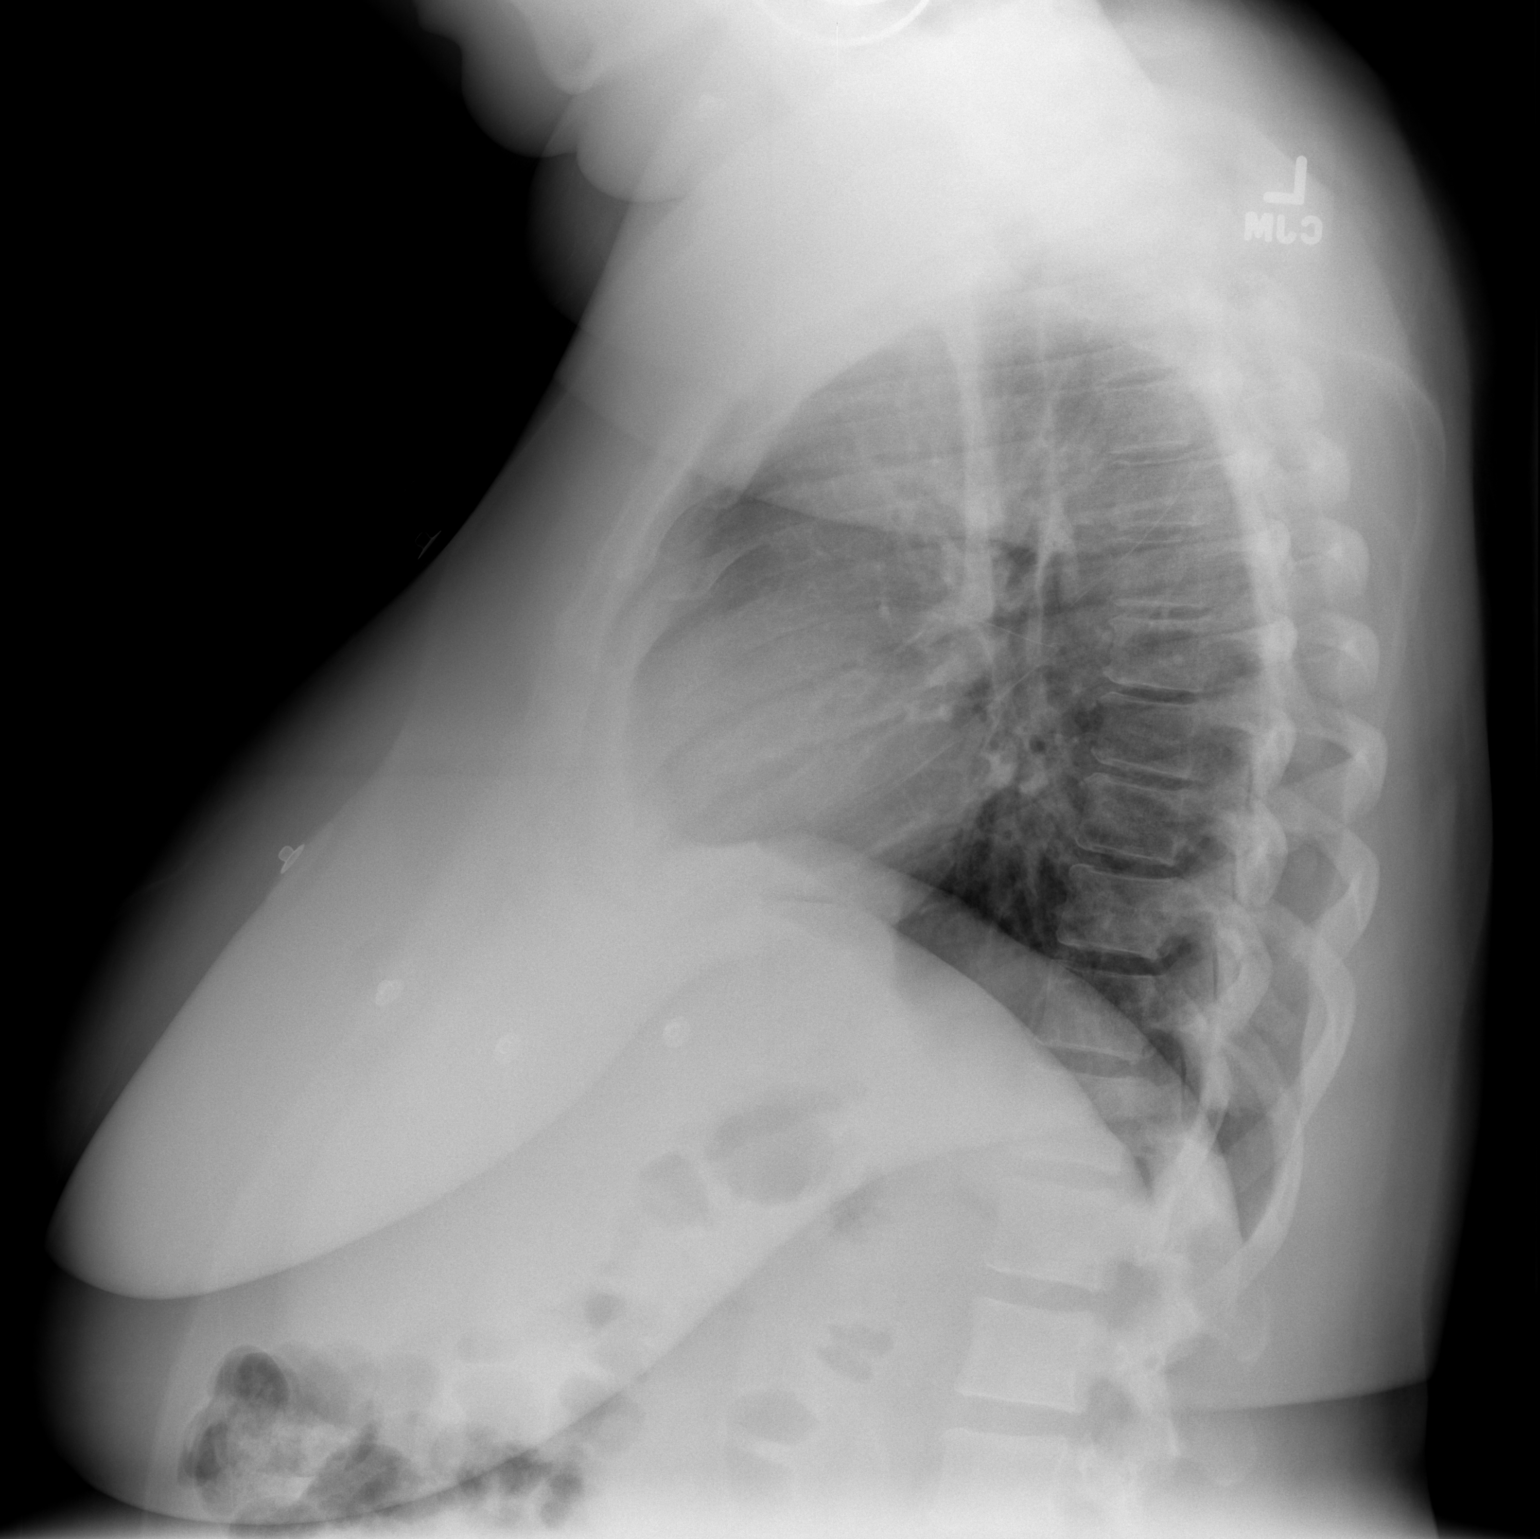

[2 of 2 positions shown; findings below may reference images not displayed]

FINDINGS: Lung volumes are normal. No consolidative airspace disease. No
pleural effusions. No pneumothorax. No pulmonary nodule or mass
noted. Pulmonary vasculature and the cardiomediastinal silhouette
are within normal limits.
IMPRESSION: 1.  No radiographic evidence of acute cardiopulmonary disease.

## 2014-06-14 ENCOUNTER — Ambulatory Visit: Payer: Self-pay | Attending: Internal Medicine | Admitting: Internal Medicine

## 2014-06-14 ENCOUNTER — Telehealth: Payer: Self-pay | Admitting: Internal Medicine

## 2014-06-14 ENCOUNTER — Encounter: Payer: Self-pay | Admitting: Internal Medicine

## 2014-06-14 VITALS — BP 125/87 | HR 85 | Temp 98.6°F | Resp 16 | Ht 61.0 in | Wt 233.0 lb

## 2014-06-14 DIAGNOSIS — Z9119 Patient's noncompliance with other medical treatment and regimen: Secondary | ICD-10-CM | POA: Insufficient documentation

## 2014-06-14 DIAGNOSIS — Z888 Allergy status to other drugs, medicaments and biological substances status: Secondary | ICD-10-CM | POA: Insufficient documentation

## 2014-06-14 DIAGNOSIS — J45909 Unspecified asthma, uncomplicated: Secondary | ICD-10-CM | POA: Insufficient documentation

## 2014-06-14 DIAGNOSIS — Z87891 Personal history of nicotine dependence: Secondary | ICD-10-CM | POA: Insufficient documentation

## 2014-06-14 DIAGNOSIS — G8929 Other chronic pain: Secondary | ICD-10-CM | POA: Insufficient documentation

## 2014-06-14 DIAGNOSIS — E876 Hypokalemia: Secondary | ICD-10-CM | POA: Insufficient documentation

## 2014-06-14 DIAGNOSIS — Z713 Dietary counseling and surveillance: Secondary | ICD-10-CM | POA: Insufficient documentation

## 2014-06-14 DIAGNOSIS — Z833 Family history of diabetes mellitus: Secondary | ICD-10-CM | POA: Insufficient documentation

## 2014-06-14 DIAGNOSIS — E669 Obesity, unspecified: Secondary | ICD-10-CM | POA: Insufficient documentation

## 2014-06-14 DIAGNOSIS — K219 Gastro-esophageal reflux disease without esophagitis: Secondary | ICD-10-CM | POA: Insufficient documentation

## 2014-06-14 DIAGNOSIS — R51 Headache: Secondary | ICD-10-CM | POA: Insufficient documentation

## 2014-06-14 DIAGNOSIS — Z79899 Other long term (current) drug therapy: Secondary | ICD-10-CM | POA: Insufficient documentation

## 2014-06-14 DIAGNOSIS — Z91199 Patient's noncompliance with other medical treatment and regimen due to unspecified reason: Secondary | ICD-10-CM | POA: Insufficient documentation

## 2014-06-14 DIAGNOSIS — E119 Type 2 diabetes mellitus without complications: Secondary | ICD-10-CM | POA: Insufficient documentation

## 2014-06-14 DIAGNOSIS — F3289 Other specified depressive episodes: Secondary | ICD-10-CM | POA: Insufficient documentation

## 2014-06-14 DIAGNOSIS — G589 Mononeuropathy, unspecified: Secondary | ICD-10-CM | POA: Insufficient documentation

## 2014-06-14 DIAGNOSIS — M549 Dorsalgia, unspecified: Secondary | ICD-10-CM | POA: Insufficient documentation

## 2014-06-14 DIAGNOSIS — R079 Chest pain, unspecified: Secondary | ICD-10-CM | POA: Insufficient documentation

## 2014-06-14 DIAGNOSIS — Z8249 Family history of ischemic heart disease and other diseases of the circulatory system: Secondary | ICD-10-CM | POA: Insufficient documentation

## 2014-06-14 DIAGNOSIS — Z794 Long term (current) use of insulin: Secondary | ICD-10-CM | POA: Insufficient documentation

## 2014-06-14 DIAGNOSIS — G629 Polyneuropathy, unspecified: Secondary | ICD-10-CM

## 2014-06-14 DIAGNOSIS — I10 Essential (primary) hypertension: Secondary | ICD-10-CM | POA: Insufficient documentation

## 2014-06-14 DIAGNOSIS — Z7982 Long term (current) use of aspirin: Secondary | ICD-10-CM | POA: Insufficient documentation

## 2014-06-14 DIAGNOSIS — F329 Major depressive disorder, single episode, unspecified: Secondary | ICD-10-CM | POA: Insufficient documentation

## 2014-06-14 LAB — GLUCOSE, POCT (MANUAL RESULT ENTRY): POC Glucose: 294 mg/dl — AB (ref 70–99)

## 2014-06-14 MED ORDER — INSULIN ASPART 100 UNIT/ML ~~LOC~~ SOLN
10.0000 [IU] | Freq: Once | SUBCUTANEOUS | Status: AC
  Administered 2014-06-14: 10 [IU] via SUBCUTANEOUS

## 2014-06-14 MED ORDER — GABAPENTIN 300 MG PO CAPS: 300.0000 mg | ORAL_CAPSULE | Freq: Three times a day (TID) | ORAL | Status: DC

## 2014-06-14 NOTE — Progress Notes (Signed)
Patient ID: [name], female   DOB: May 10, 1971, 43 y.o.   MRN: [id number]  CC: HTN, DM, asthma  HPI:  Patient reports that she has been able to decrease some of her stress by making her oldest daughter move out of the house.  Patient reports that she has noticed great improvement in her BP.  She states that she has been following the dash diet recommendations.  She admits to not taking her diabetes medication on a daily  She just received the lantus and was taking 70/30 until she was able to get Lantus.  She admits to taking farxiga daily but may only take insulin 3 times per week.    Allergies  Allergen Reactions  . Reglan [Metoclopramide] Other (See Comments)    Panic attack   Past Medical History  Diagnosis Date  . Hypertension   . Hypokalemia   . Asthma   . DM (diabetes mellitus)     INSULIN DEPENDENT  . Depression   . GERD (gastroesophageal reflux disease)   . Headache(784.0)   . Chronic back pain   . Chronic chest pain    Current Outpatient Prescriptions on File Prior to Visit  Medication Sig Dispense Refill  . albuterol (PROVENTIL HFA;VENTOLIN HFA) 108 (90 BASE) MCG/ACT inhaler Inhale 2 puffs into the lungs every 6 (six) hours as needed for wheezing or shortness of breath.      Marland Kitchen albuterol (PROVENTIL) (2.5 MG/3ML) 0.083% nebulizer solution Take 2.5 mg by nebulization every 6 (six) hours as needed for wheezing or shortness of breath.      Marland Kitchen amLODipine (NORVASC) 10 MG tablet Take 10 mg by mouth daily.      Marland Kitchen aspirin EC 325 MG tablet Take 325 mg by mouth daily.      . benazepril (LOTENSIN) 20 MG tablet Take 20 mg by mouth daily.      . carvedilol (COREG) 3.125 MG tablet Take 3.125 mg by mouth 2 (two) times daily with a meal.      . cyclobenzaprine (FLEXERIL) 5 MG tablet Take 5 mg by mouth 3 (three) times daily as needed for muscle spasms.      . Dapagliflozin Propanediol (FARXIGA) 5 MG TABS Take 5 mg by mouth daily.      . Fluticasone-Salmeterol (ADVAIR) 250-50 MCG/DOSE  AEPB Inhale 1 puff into the lungs 2 times daily at 12 noon and 4 pm.  60 each  0  . gabapentin (NEURONTIN) 400 MG capsule Take 400 mg by mouth 3 (three) times daily.      . hydrochlorothiazide (HYDRODIURIL) 25 MG tablet Take 25 mg by mouth daily.      . insulin NPH-regular Human (NOVOLIN 70/30) (70-30) 100 UNIT/ML injection Inject 50 Units into the skin 2 (two) times daily with a meal.      . ipratropium (ATROVENT) 0.02 % nebulizer solution Take 0.5 mg by nebulization every 6 (six) hours as needed for wheezing or shortness of breath.       . meloxicam (MOBIC) 7.5 MG tablet Take 7.5 mg by mouth daily.      . naproxen sodium (ALEVE) 220 MG tablet Take 220 mg by mouth 3 (three) times daily as needed (pain).      . ondansetron (ZOFRAN) 4 MG tablet Take 1 tablet (4 mg total) by mouth every 8 (eight) hours as needed for nausea or vomiting.  12 tablet  0  . predniSONE (DELTASONE) 10 MG tablet Starting on 9/16: Take 60 mg for 1 day, 40 mg  for 3 days, 30 mg for 3 days, 20 mg for 3 days, and 10 mg for 3 days.  36 tablet  0  . sertraline (ZOLOFT) 25 MG tablet Take 25 mg by mouth daily.      . simvastatin (ZOCOR) 10 MG tablet Take 10 mg by mouth daily at 6 PM.       . SUMAtriptan (IMITREX) 25 MG tablet Take 25 mg by mouth every 2 (two) hours as needed for migraine or headache. May repeat in 2 hours if headache persists or recurs.      . topiramate (TOPAMAX) 50 MG tablet Take 50 mg by mouth 2 (two) times daily.       No current facility-administered medications on file prior to visit.   Family History  Problem Relation Age of Onset  . Heart attack Mother   . Stroke Mother   . Diabetes Mother   . Hypertension Mother   . Arthritis Mother   . Stroke Father   . Hypertension Sister   . Diabetes Sister   . Seizures Brother   . Diabetes Brother    History   Social History  . Marital Status: Married    Spouse Name: N/A    Number of Children: N/A  . Years of Education: N/A   Occupational History  .  Not on file.   Social History Main Topics  . Smoking status: Former Smoker -- 0.30 packs/day for 22 years    Types: Cigarettes    Quit date: 05/01/2011  . Smokeless tobacco: Never Used  . Alcohol Use: Yes     Comment: rare  . Drug Use: No  . Sexual Activity: Not on file   Other Topics Concern  . Not on file   Social History Narrative  . No narrative on file   Review of Systems  Eyes: Positive for blurred vision.  Respiratory: Negative.   Cardiovascular: Negative.   Gastrointestinal: Negative.   Genitourinary: Negative for frequency.  Neurological: Positive for tingling and headaches. Negative for dizziness.  Endo/Heme/Allergies: Negative for polydipsia.     Objective:   Filed Vitals:   06/14/14 1137  BP: 125/87  Pulse: 85  Temp: 98.6 F (37 C)  Resp: 16    Physical Exam  Constitutional: She is oriented to person, place, and time.  Cardiovascular: Normal rate, regular rhythm and normal heart sounds.   Pulmonary/Chest: Effort normal and breath sounds normal.  Abdominal: Soft. Bowel sounds are normal.  Musculoskeletal: Normal range of motion. She exhibits no edema and no tenderness.  Neurological: She is alert and oriented to person, place, and time.  Skin: Skin is warm and dry.  Psychiatric: She has a normal mood and affect.     Lab Results  Component Value Date   WBC 15.0* 06/06/2014   HGB 11.5* 06/06/2014   HCT 34.7* 06/06/2014   MCV 87.0 06/06/2014   PLT 316 06/06/2014   Lab Results  Component Value Date   CREATININE 0.93 06/06/2014   BUN 12 06/06/2014   NA 140 06/06/2014   K 4.2 06/06/2014   CL 108 06/06/2014   CO2 18* 06/06/2014    Lab Results  Component Value Date   HGBA1C 9.3* 06/04/2014   Lipid Panel     Component Value Date/Time   CHOL 170 03/01/2014 0511   TRIG 182* 03/01/2014 0511   HDL 47 03/01/2014 0511   CHOLHDL 3.6 03/01/2014 0511   VLDL 36 03/01/2014 0511   LDLCALC 87 03/01/2014 0511  Assessment and plan:   [name] was seen today  for follow-up.  Diagnoses and associated orders for this visit:  Type 2 diabetes mellitus without complication - Glucose (CBG) - insulin aspart (novoLOG) injection 10 Units; Inject 0.1 mLs (10 Units total) into the skin once. Explained headaches may be a result of non compliance with DM management  Stressed adherence to statin therapy with current risk factors   Essential hypertension Continue current medications, dash diet, and stress reduction    Neuropathy - gabapentin (NEURONTIN) 300 MG capsule; Take 1 capsule (300 mg total) by mouth 3 (three) times daily.  Discussed not driving while taking this medications   Obesity Highly stressed weight loss and how it helps lower risk of various complications    Return in about 2 weeks (around 06/28/2014) for 2 week-CBG check and 3 mo PCP-pap.  RN may increase lantus to 15 units nightly if BS are still elevated on CBG check       Chari Manning, NP-C [facility] [phone] 06/25/2014, 5:22 PM

## 2014-06-14 NOTE — Telephone Encounter (Signed)
I will print the report and take it to her.  The report can be found in chart review under the media tab.

## 2014-06-14 NOTE — Progress Notes (Signed)
Pt is here following up on her HTN, diabetes, and asthma.

## 2014-06-14 NOTE — Telephone Encounter (Signed)
Dr [name] is requesting the retinal scan done 04-20-14 Fax [place] dr [name]

## 2014-06-15 NOTE — Telephone Encounter (Signed)
Please call this patient and let her know that her retinal scan was normal. No diabetic retinopathy

## 2014-06-19 ENCOUNTER — Telehealth: Payer: Self-pay | Admitting: Emergency Medicine

## 2014-06-19 NOTE — Telephone Encounter (Signed)
Pt given retinal scan results

## 2014-06-28 ENCOUNTER — Ambulatory Visit: Payer: Self-pay | Attending: Internal Medicine

## 2014-06-28 VITALS — BP 131/83 | HR 82 | Resp 16

## 2014-06-28 DIAGNOSIS — Z794 Long term (current) use of insulin: Secondary | ICD-10-CM | POA: Insufficient documentation

## 2014-06-28 DIAGNOSIS — E1310 Other specified diabetes mellitus with ketoacidosis without coma: Secondary | ICD-10-CM | POA: Insufficient documentation

## 2014-06-28 DIAGNOSIS — E1110 Type 2 diabetes mellitus with ketoacidosis without coma: Secondary | ICD-10-CM

## 2014-06-28 LAB — GLUCOSE, POCT (MANUAL RESULT ENTRY): POC GLUCOSE: 211 mg/dL — AB (ref 70–99)

## 2014-06-28 MED ORDER — INSULIN GLARGINE 100 UNIT/ML ~~LOC~~ SOLN: 15.0000 [IU] | Freq: Every day | SUBCUTANEOUS | Status: DC

## 2014-06-28 NOTE — Patient Instructions (Addendum)
Take 15 units at bedtime every night with Farxiga 5 mg tablet daily Monitor carbohydrates intake and return in 2 weeks for CBg recheck Diabetes food literature given with new log

## 2014-07-07 ENCOUNTER — Other Ambulatory Visit: Payer: Self-pay

## 2014-07-12 ENCOUNTER — Ambulatory Visit: Payer: Self-pay | Attending: Internal Medicine

## 2014-07-12 VITALS — BP 134/85 | HR 75 | Resp 18

## 2014-07-12 DIAGNOSIS — E1110 Type 2 diabetes mellitus with ketoacidosis without coma: Secondary | ICD-10-CM

## 2014-07-12 DIAGNOSIS — E1310 Other specified diabetes mellitus with ketoacidosis without coma: Secondary | ICD-10-CM

## 2014-07-12 LAB — GLUCOSE, POCT (MANUAL RESULT ENTRY): POC GLUCOSE: 112 mg/dL — AB (ref 70–99)

## 2014-07-12 NOTE — Patient Instructions (Signed)
Continue taking prescribed blood sugar insulin with home CBG monitoring Return with next visit CBG log Medications given today for Migraine headache

## 2014-07-26 ENCOUNTER — Other Ambulatory Visit: Payer: Self-pay

## 2014-07-26 ENCOUNTER — Ambulatory Visit: Payer: Self-pay | Attending: Internal Medicine

## 2014-07-26 VITALS — BP 113/70 | HR 69 | Resp 16

## 2014-07-26 DIAGNOSIS — R51 Headache: Secondary | ICD-10-CM | POA: Insufficient documentation

## 2014-07-26 DIAGNOSIS — E1310 Other specified diabetes mellitus with ketoacidosis without coma: Secondary | ICD-10-CM

## 2014-07-26 DIAGNOSIS — Z131 Encounter for screening for diabetes mellitus: Secondary | ICD-10-CM | POA: Insufficient documentation

## 2014-07-26 DIAGNOSIS — E1110 Type 2 diabetes mellitus with ketoacidosis without coma: Secondary | ICD-10-CM

## 2014-07-26 LAB — GLUCOSE, POCT (MANUAL RESULT ENTRY): POC Glucose: 289 mg/dl — AB (ref 70–99)

## 2014-07-26 MED ORDER — INSULIN GLARGINE 100 UNIT/ML ~~LOC~~ SOLN: 18.0000 [IU] | Freq: Every day | SUBCUTANEOUS | Status: DC

## 2014-07-26 NOTE — Patient Instructions (Signed)
Increase Lantus to 18 units @ bedtime Return in 1 month for nurse visit/cbg log Referral Neurology placed

## 2014-07-26 NOTE — Addendum Note (Signed)
Addended by: Candie Chroman D on: 07/26/2014 11:26 AM   Modules accepted: Orders, Medications

## 2014-07-26 NOTE — Progress Notes (Addendum)
Pt comes in for CBg recheck with taking Farxiga 5mg  and Lantus 15 units @ bedtime C/o frequent headaches  CBG- 289 states she only ate toast this am Denies blurry vision,dizziness,BP stable Pt is compliant with taking medications and recording CBG's

## 2014-08-06 ENCOUNTER — Emergency Department (HOSPITAL_COMMUNITY): Payer: Self-pay

## 2014-08-06 ENCOUNTER — Emergency Department (HOSPITAL_COMMUNITY)
Admission: EM | Admit: 2014-08-06 | Discharge: 2014-08-06 | Disposition: A | Discharge status: Home / Self Care | Payer: Self-pay | Attending: Emergency Medicine | Admitting: Emergency Medicine

## 2014-08-06 ENCOUNTER — Encounter (HOSPITAL_COMMUNITY): Payer: Self-pay

## 2014-08-06 DIAGNOSIS — E119 Type 2 diabetes mellitus without complications: Secondary | ICD-10-CM | POA: Insufficient documentation

## 2014-08-06 DIAGNOSIS — G8929 Other chronic pain: Secondary | ICD-10-CM | POA: Insufficient documentation

## 2014-08-06 DIAGNOSIS — Y9389 Activity, other specified: Secondary | ICD-10-CM | POA: Insufficient documentation

## 2014-08-06 DIAGNOSIS — I10 Essential (primary) hypertension: Secondary | ICD-10-CM | POA: Insufficient documentation

## 2014-08-06 DIAGNOSIS — F329 Major depressive disorder, single episode, unspecified: Secondary | ICD-10-CM | POA: Insufficient documentation

## 2014-08-06 DIAGNOSIS — Y929 Unspecified place or not applicable: Secondary | ICD-10-CM | POA: Insufficient documentation

## 2014-08-06 DIAGNOSIS — Z794 Long term (current) use of insulin: Secondary | ICD-10-CM | POA: Insufficient documentation

## 2014-08-06 DIAGNOSIS — M25512 Pain in left shoulder: Secondary | ICD-10-CM

## 2014-08-06 DIAGNOSIS — K219 Gastro-esophageal reflux disease without esophagitis: Secondary | ICD-10-CM | POA: Insufficient documentation

## 2014-08-06 DIAGNOSIS — S4992XA Unspecified injury of left shoulder and upper arm, initial encounter: Secondary | ICD-10-CM | POA: Insufficient documentation

## 2014-08-06 DIAGNOSIS — X58XXXA Exposure to other specified factors, initial encounter: Secondary | ICD-10-CM | POA: Insufficient documentation

## 2014-08-06 DIAGNOSIS — J45909 Unspecified asthma, uncomplicated: Secondary | ICD-10-CM | POA: Insufficient documentation

## 2014-08-06 DIAGNOSIS — Y998 Other external cause status: Secondary | ICD-10-CM | POA: Insufficient documentation

## 2014-08-06 DIAGNOSIS — Z7982 Long term (current) use of aspirin: Secondary | ICD-10-CM | POA: Insufficient documentation

## 2014-08-06 DIAGNOSIS — Z87891 Personal history of nicotine dependence: Secondary | ICD-10-CM | POA: Insufficient documentation

## 2014-08-06 DIAGNOSIS — Z79899 Other long term (current) drug therapy: Secondary | ICD-10-CM | POA: Insufficient documentation

## 2014-08-06 MED ORDER — OXYCODONE-ACETAMINOPHEN 5-325 MG PO TABS
1.0000 | ORAL_TABLET | Freq: Once | ORAL | Status: AC
  Administered 2014-08-06: 1 via ORAL
  Filled 2014-08-06: qty 1

## 2014-08-06 MED ORDER — METHOCARBAMOL 500 MG PO TABS: 500.0000 mg | ORAL_TABLET | Freq: Two times a day (BID) | ORAL | Status: DC

## 2014-08-06 MED ORDER — OXYCODONE-ACETAMINOPHEN 5-325 MG PO TABS
1.0000 | ORAL_TABLET | Freq: Once | ORAL | Status: AC
Start: 2014-08-06 — End: 2014-08-06
  Administered 2014-08-06: 1 via ORAL
  Filled 2014-08-06: qty 1

## 2014-08-06 MED ORDER — METHOCARBAMOL 500 MG PO TABS
500.0000 mg | ORAL_TABLET | Freq: Once | ORAL | Status: AC
  Administered 2014-08-06: 500 mg via ORAL
  Filled 2014-08-06: qty 1

## 2014-08-06 MED ORDER — OXYCODONE-ACETAMINOPHEN 5-325 MG PO TABS: 1.0000 | ORAL_TABLET | Freq: Four times a day (QID) | ORAL | Status: DC | PRN

## 2014-08-06 NOTE — ED Provider Notes (Signed)
CSN: [id number]     Arrival date & time 08/06/14  2706 History   First MD Initiated Contact with Patient 08/06/14 [id number]     Chief Complaint  Patient presents with  . Shoulder Pain     (Consider location/radiation/quality/duration/timing/severity/associated sxs/prior Treatment) HPI Comments: Patient with c/o pain in her left shoulder starting acutely 1-2 weeks ago after she was reaching to close a window. Patient has had severe pain, worse with movement since that time. Patient states that the pain radiates down her left arm. She denies distal numbness and tingling. She denies weakness in her arm or hand. She denies neck pain but does have some muscle pain along the side of her left neck which is causing her to have a headache. Patient has tried over-the-counter pain medications and tramadol without relief. She does not have an orthopedic physician.  Patient is a 43 y.o. female presenting with shoulder pain. The history is provided by the patient.  Shoulder Pain Associated symptoms: no back pain and no neck pain     Past Medical History  Diagnosis Date  . Hypertension   . Hypokalemia   . Asthma   . DM (diabetes mellitus)     INSULIN DEPENDENT  . Depression   . GERD (gastroesophageal reflux disease)   . Headache(784.0)   . Chronic back pain   . Chronic chest pain    Past Surgical History  Procedure Laterality Date  . Cesarean section      x 3  . Appendectomy    . Vesicovaginal fistula closure w/ tah  2009  . Hernia repair    . Cholecystectomy N/A 03/02/2014    Procedure: LAPAROSCOPIC CHOLECYSTECTOMY;  Surgeon: Joyice Faster. Cornett, MD;  Location: [facility];  Service: General;  Laterality: N/A;   Family History  Problem Relation Age of Onset  . Heart attack Mother   . Stroke Mother   . Diabetes Mother   . Hypertension Mother   . Arthritis Mother   . Stroke Father   . Hypertension Sister   . Diabetes Sister   . Seizures Brother   . Diabetes Brother    History  Substance Use  Topics  . Smoking status: Former Smoker -- 0.30 packs/day for 22 years    Types: Cigarettes    Quit date: 05/01/2011  . Smokeless tobacco: Never Used  . Alcohol Use: Yes     Comment: rare   OB History    No data available     Review of Systems  Constitutional: Negative for activity change.  Musculoskeletal: Positive for myalgias and arthralgias. Negative for back pain, joint swelling and neck pain.  Skin: Negative for wound.  Neurological: Negative for weakness and numbness.   Allergies  Reglan  Home Medications   Prior to Admission medications   Medication Sig Start Date End Date Taking? Authorizing Provider  albuterol (PROVENTIL HFA;VENTOLIN HFA) 108 (90 BASE) MCG/ACT inhaler Inhale 2 puffs into the lungs every 6 (six) hours as needed for wheezing or shortness of breath.    Historical Provider, MD  albuterol (PROVENTIL) (2.5 MG/3ML) 0.083% nebulizer solution Take 2.5 mg by nebulization every 6 (six) hours as needed for wheezing or shortness of breath.    Historical Provider, MD  amLODipine (NORVASC) 10 MG tablet Take 10 mg by mouth daily.    Historical Provider, MD  aspirin EC 325 MG tablet Take 325 mg by mouth daily.    Historical Provider, MD  benazepril (LOTENSIN) 20 MG tablet Take 20 mg by mouth  daily.    Historical Provider, MD  carvedilol (COREG) 3.125 MG tablet Take 3.125 mg by mouth 2 (two) times daily with a meal.    Historical Provider, MD  Dapagliflozin Propanediol (FARXIGA) 5 MG TABS Take 5 mg by mouth daily.    Historical Provider, MD  Fluticasone-Salmeterol (ADVAIR) 250-50 MCG/DOSE AEPB Inhale 1 puff into the lungs 2 times daily at 12 noon and 4 pm. 06/06/14   Albin Felling, MD  gabapentin (NEURONTIN) 300 MG capsule Take 1 capsule (300 mg total) by mouth 3 (three) times daily. 06/14/14   Lance Bosch, NP  hydrochlorothiazide (HYDRODIURIL) 25 MG tablet Take 25 mg by mouth daily.    Historical Provider, MD  insulin glargine (LANTUS) 100 UNIT/ML injection Inject 0.18 mLs  (18 Units total) into the skin at bedtime. 07/26/14   Lance Bosch, NP  ipratropium (ATROVENT) 0.02 % nebulizer solution Take 0.5 mg by nebulization every 6 (six) hours as needed for wheezing or shortness of breath.     Historical Provider, MD  sertraline (ZOLOFT) 25 MG tablet Take 25 mg by mouth daily.    Historical Provider, MD  simvastatin (ZOCOR) 10 MG tablet Take 10 mg by mouth daily at 6 PM.     Historical Provider, MD  SUMAtriptan (IMITREX) 25 MG tablet Take 25 mg by mouth every 2 (two) hours as needed for migraine or headache. May repeat in 2 hours if headache persists or recurs.    Historical Provider, MD  topiramate (TOPAMAX) 50 MG tablet Take 50 mg by mouth 2 (two) times daily.    Historical Provider, MD   BP 146/91 mmHg  Temp(Src) 98.4 F (36.9 C) (Oral)  Resp 16  Ht 5\' 1"  (1.549 m)  Wt 235 lb (106.595 kg)  BMI 44.43 kg/m2  SpO2 100%   Physical Exam  Constitutional: She appears well-developed and well-nourished.  HENT:  Head: Normocephalic and atraumatic.  Eyes: Pupils are equal, round, and reactive to light.  Neck: Normal range of motion. Neck supple.  Cardiovascular: Exam reveals no decreased pulses.   Pulses:      Radial pulses are 2+ on the right side, and 2+ on the left side.  Musculoskeletal: She exhibits tenderness. She exhibits no edema.       Right shoulder: Normal.       Left shoulder: She exhibits decreased range of motion, tenderness and bony tenderness.       Left elbow: Normal.       Left wrist: Normal.       Cervical back: She exhibits tenderness. She exhibits normal range of motion and no bony tenderness.       Back:       Left upper arm: She exhibits bony tenderness. She exhibits no tenderness and no swelling.       Left forearm: Normal.       Left hand: Normal.  Neurological: She is alert. No sensory deficit.  Motor, sensation, and vascular distal to the injury is fully intact.   Skin: Skin is warm and dry.  Psychiatric: She has a normal mood and  affect.  Nursing note and vitals reviewed.   ED Course  Procedures (including critical care time) Labs Review Labs Reviewed - No data to display  Imaging Review Dg Shoulder Left  08/06/2014   CLINICAL DATA:  Pain after reaching 2 close a window 2 weeks ago.  EXAM: LEFT SHOULDER - 2+ VIEW  COMPARISON:  None.  FINDINGS: The acromioclavicular joint is aligned. The humeral  head is located. There is a small calcific density superior to the humeral head and inferior to the acromion, which could be a small loose body, or could be related to calcific tendinitis. No acute fracture.  IMPRESSION: 1. No acute fracture. 2. Small calcific density superior to the humeral head and inferior to the acromion could be a small loose body, or calcification within a rotator cuff tendon.   Electronically Signed   By: Curlene Dolphin M.D.   On: 08/06/2014 11:27     EKG Interpretation None       9:56 AM Patient seen and examined. Work-up initiated. Medications ordered.   Vital signs reviewed and are as follows: BP 146/91 mmHg  Temp(Src) 98.4 F (36.9 C) (Oral)  Resp 16  Ht 5\' 1"  (1.549 m)  Wt 235 lb (106.595 kg)  BMI 44.43 kg/m2  SpO2 100%  11:56 AM Patient informed of imaging results. She continues to have some pain. Additional percocet ordered. Will d/c to home with ortho f/u. Sling by nurse.   Patient counseled on use of narcotic pain medications. Counseled not to combine these medications with others containing tylenol. Urged not to drink alcohol, drive, or perform any other activities that requires focus while taking these medications. The patient verbalizes understanding and agrees with the plan.  Patient was counseled on RICE protocol and told to rest injury, use ice for no longer than 15 minutes every hour, compress the area, and elevate above the level of their heart as much as possible to reduce swelling. Questions answered. Patient verbalized understanding.      MDM   Final diagnoses:    Shoulder pain, acute, left   Patient with shoulder injury, continued pain. No fever, signs of septic joint. X-ray neg for acute changes including dislocation. Question rotator cuff tear. She will need ortho f/u as well as pain control, rest.    Carlisle Cater, PA-C 08/06/14 1159  Leota Jacobsen, MD 08/07/14 1520

## 2014-08-06 NOTE — ED Notes (Signed)
Was pulling window x 2 weeks ago and had a sharp stabbing pain in left shoulder which has continued to get worse. Rates pain 10/10. Has tried tramadol without relief

## 2014-08-06 NOTE — Discharge Instructions (Signed)
Please read and follow all provided instructions.  Your diagnoses today include:  1. Shoulder pain, acute, left     Tests performed today include:  An x-ray of the affected area - does NOT show any broken bones  Vital signs. See below for your results today.   Medications prescribed:   Percocet (oxycodone/acetaminophen) - narcotic pain medication  DO NOT drive or perform any activities that require you to be awake and alert because this medicine can make you drowsy. BE VERY CAREFUL not to take multiple medicines containing Tylenol (also called acetaminophen). Doing so can lead to an overdose which can damage your liver and cause liver failure and possibly death.   Robaxin (methocarbamol) - muscle relaxer medication  DO NOT drive or perform any activities that require you to be awake and alert because this medicine can make you drowsy.   Take any prescribed medications only as directed.  Home care instructions:   Follow any educational materials contained in this packet  Follow R.I.C.E. Protocol:  R - rest your injury   I  - use ice on injury without applying directly to skin  C - compress injury with bandage or splint  E - elevate the injury as much as possible  Follow-up instructions: Please follow-up with the provided orthopedic physician next week for further evaluation of your injury.    Return instructions:   Please return if your toes are numb or tingling, appear gray or blue, or you have severe pain  Please return to the Emergency Department if you experience worsening symptoms.   Please return if you have any other emergent concerns.  Additional Information:  Your vital signs today were: BP 149/99 mmHg   Pulse 78   Temp(Src) 98.4 F (36.9 C) (Oral)   Resp 15   Ht 5\' 1"  (1.549 m)   Wt 235 lb (106.595 kg)   BMI 44.43 kg/m2   SpO2 97% If your blood pressure (BP) was elevated above 135/85 this visit, please have this repeated by your doctor within one  month. --------------

## 2014-08-06 NOTE — ED Notes (Signed)
Patient transported to X-ray 

## 2014-08-06 NOTE — ED Notes (Signed)
rerturned from radiology

## 2014-08-23 ENCOUNTER — Other Ambulatory Visit: Payer: Self-pay

## 2014-08-24 ENCOUNTER — Encounter: Payer: Self-pay | Admitting: Internal Medicine

## 2014-09-01 ENCOUNTER — Ambulatory Visit: Payer: Self-pay | Attending: Internal Medicine | Admitting: Internal Medicine

## 2014-09-01 ENCOUNTER — Encounter: Payer: Self-pay | Admitting: Internal Medicine

## 2014-09-01 VITALS — BP 149/100 | HR 96 | Temp 98.1°F | Resp 14 | Ht 61.0 in | Wt 234.0 lb

## 2014-09-01 DIAGNOSIS — Z0000 Encounter for general adult medical examination without abnormal findings: Secondary | ICD-10-CM

## 2014-09-01 DIAGNOSIS — I10 Essential (primary) hypertension: Secondary | ICD-10-CM

## 2014-09-01 DIAGNOSIS — Z01419 Encounter for gynecological examination (general) (routine) without abnormal findings: Secondary | ICD-10-CM | POA: Insufficient documentation

## 2014-09-01 DIAGNOSIS — E1165 Type 2 diabetes mellitus with hyperglycemia: Secondary | ICD-10-CM | POA: Insufficient documentation

## 2014-09-01 DIAGNOSIS — Z9114 Patient's other noncompliance with medication regimen: Secondary | ICD-10-CM | POA: Insufficient documentation

## 2014-09-01 DIAGNOSIS — Z794 Long term (current) use of insulin: Secondary | ICD-10-CM | POA: Insufficient documentation

## 2014-09-01 DIAGNOSIS — M25512 Pain in left shoulder: Secondary | ICD-10-CM

## 2014-09-01 DIAGNOSIS — E119 Type 2 diabetes mellitus without complications: Secondary | ICD-10-CM

## 2014-09-01 LAB — GLUCOSE, POCT (MANUAL RESULT ENTRY)

## 2014-09-01 LAB — POCT GLYCOSYLATED HEMOGLOBIN (HGB A1C): Hemoglobin A1C: 9.5

## 2014-09-01 NOTE — Progress Notes (Signed)
Pt is here for a physical and a pap smear. Pt reports having extreme pain in her left shoulder and the back of her knees.

## 2014-09-01 NOTE — Patient Instructions (Signed)
DASH Eating Plan DASH stands for "Dietary Approaches to Stop Hypertension." The DASH eating plan is a healthy eating plan that has been shown to reduce high blood pressure (hypertension). Additional health benefits may include reducing the risk of type 2 diabetes mellitus, heart disease, and stroke. The DASH eating plan may also help with weight loss. WHAT DO I NEED TO KNOW ABOUT THE DASH EATING PLAN? For the DASH eating plan, you will follow these general guidelines:  Choose foods with a percent daily value for sodium of less than 5% (as listed on the food label).  Use salt-free seasonings or herbs instead of table salt or sea salt.  Check with your health care provider or pharmacist before using salt substitutes.  Eat lower-sodium products, often labeled as "lower sodium" or "no salt added."  Eat fresh foods.  Eat more vegetables, fruits, and low-fat dairy products.  Choose whole grains. Look for the word "whole" as the first word in the ingredient list.  Choose fish and skinless chicken or [place] more often than red meat. Limit fish, poultry, and meat to 6 oz (170 g) each day.  Limit sweets, desserts, sugars, and sugary drinks.  Choose heart-healthy fats.  Limit cheese to 1 oz (28 g) per day.  Eat more home-cooked food and less restaurant, buffet, and fast food.  Limit fried foods.  Cook foods using methods other than frying.  Limit canned vegetables. If you do use them, rinse them well to decrease the sodium.  When eating at a restaurant, ask that your food be prepared with less salt, or no salt if possible. WHAT FOODS CAN I EAT? Seek help from a dietitian for individual calorie needs. Grains Whole grain or whole wheat bread. Brown rice. Whole grain or whole wheat pasta. Quinoa, bulgur, and whole grain cereals. Low-sodium cereals. Corn or whole wheat flour tortillas. Whole grain cornbread. Whole grain crackers. Low-sodium crackers. Vegetables Fresh or frozen vegetables  (raw, steamed, roasted, or grilled). Low-sodium or reduced-sodium tomato and vegetable juices. Low-sodium or reduced-sodium tomato sauce and paste. Low-sodium or reduced-sodium canned vegetables.  Fruits All fresh, canned (in natural juice), or frozen fruits. Meat and Other Protein Products Ground beef (85% or leaner), grass-fed beef, or beef trimmed of fat. Skinless chicken or [place]. Ground chicken or [place]. Pork trimmed of fat. All fish and seafood. Eggs. Dried beans, peas, or lentils. Unsalted nuts and seeds. Unsalted canned beans. Dairy Low-fat dairy products, such as skim or 1% milk, 2% or reduced-fat cheeses, low-fat ricotta or cottage cheese, or plain low-fat yogurt. Low-sodium or reduced-sodium cheeses. Fats and Oils Tub margarines without trans fats. Light or reduced-fat mayonnaise and salad dressings (reduced sodium). Avocado. Safflower, olive, or canola oils. Natural peanut or almond butter. Other Unsalted popcorn and pretzels. The items listed above may not be a complete list of recommended foods or beverages. Contact your dietitian for more options. WHAT FOODS ARE NOT RECOMMENDED? Grains White bread. White pasta. White rice. Refined cornbread. Bagels and croissants. Crackers that contain trans fat. Vegetables Creamed or fried vegetables. Vegetables in a cheese sauce. Regular canned vegetables. Regular canned tomato sauce and paste. Regular tomato and vegetable juices. Fruits Dried fruits. Canned fruit in light or heavy syrup. Fruit juice. Meat and Other Protein Products Fatty cuts of meat. Ribs, chicken wings, bacon, sausage, bologna, salami, chitterlings, fatback, hot dogs, bratwurst, and packaged luncheon meats. Salted nuts and seeds. Canned beans with salt. Dairy Whole or 2% milk, cream, half-and-half, and cream cheese. Whole-fat or sweetened yogurt. Full-fat  cheeses or blue cheese. Nondairy creamers and whipped toppings. Processed cheese, cheese spreads, or cheese  curds. Condiments Onion and garlic salt, seasoned salt, table salt, and sea salt. Canned and packaged gravies. Worcestershire sauce. Tartar sauce. Barbecue sauce. Teriyaki sauce. Soy sauce, including reduced sodium. Steak sauce. Fish sauce. Oyster sauce. Cocktail sauce. Horseradish. Ketchup and mustard. Meat flavorings and tenderizers. Bouillon cubes. Hot sauce. Tabasco sauce. Marinades. Taco seasonings. Relishes. Fats and Oils Butter, stick margarine, lard, shortening, ghee, and bacon fat. Coconut, palm kernel, or palm oils. Regular salad dressings. Other Pickles and olives. Salted popcorn and pretzels. The items listed above may not be a complete list of foods and beverages to avoid. Contact your dietitian for more information. WHERE CAN I FIND MORE INFORMATION? [employer], [employer], [employer]: [employer]stabl[employer] Document Released: 08/28/2011 Document Revised: 01/23/2014 Document Reviewed: 07/13/2013 [facility] Patient Information 2015 [place], [name]. This information is not intended to replace advice given to you by your health care provider. Make sure you discuss any questions you have with your health care provider. Health Maintenance Adopting a healthy lifestyle and getting preventive care can go a long way to promote health and wellness. Talk with your health care provider about what schedule of regular examinations is right for you. This is a good chance for you to check in with your provider about disease prevention and staying healthy. In between checkups, there are plenty of things you can do on your own. Experts have done a lot of research about which lifestyle changes and preventive measures are most likely to keep you healthy. Ask your health care provider for more information. WEIGHT AND DIET  Eat a healthy diet  Be sure to include plenty of vegetables, fruits, low-fat dairy products, and lean protein.  Do not eat a lot of foods high in  solid fats, added sugars, or salt.  Get regular exercise. This is one of the most important things you can do for your health.  Most adults should exercise for at least 150 minutes each week. The exercise should increase your heart rate and make you sweat (moderate-intensity exercise).  Most adults should also do strengthening exercises at least twice a week. This is in addition to the moderate-intensity exercise.  Maintain a healthy weight  Body mass index (BMI) is a measurement that can be used to identify possible weight problems. It estimates body fat based on height and weight. Your health care provider can help determine your BMI and help you achieve or maintain a healthy weight.  For females 41 years of age and older:   A BMI below 18.5 is considered underweight.  A BMI of 18.5 to 24.9 is normal.  A BMI of 25 to 29.9 is considered overweight.  A BMI of 30 and above is considered obese.  Watch levels of cholesterol and blood lipids  You should start having your blood tested for lipids and cholesterol at 45 years of age, then have this test every 5 years.  You may need to have your cholesterol levels checked more often if:  Your lipid or cholesterol levels are high.  You are older than 45 years of age.  You are at high risk for heart disease.  CANCER SCREENING   Lung Cancer  Lung cancer screening is recommended for adults 76-65 years old who are at high risk for lung cancer because of a history of smoking.  A yearly low-dose CT scan of the lungs is recommended for people who:  Currently smoke.  Have  quit within the past 15 years.  Have at least a 30-pack-year history of smoking. A pack year is smoking an average of one pack of cigarettes a day for 1 year.  Yearly screening should continue until it has been 15 years since you quit.  Yearly screening should stop if you develop a health problem that would prevent you from having lung cancer treatment.  Breast  Cancer  Practice breast self-awareness. This means understanding how your breasts normally appear and feel.  It also means doing regular breast self-exams. Let your health care provider know about any changes, no matter how small.  If you are in your 20s or 30s, you should have a clinical breast exam (CBE) by a health care provider every 1-3 years as part of a regular health exam.  If you are 44 or older, have a CBE every year. Also consider having a breast X-ray (mammogram) every year.  If you have a family history of breast cancer, talk to your health care provider about genetic screening.  If you are at high risk for breast cancer, talk to your health care provider about having an MRI and a mammogram every year.  Breast cancer gene (BRCA) assessment is recommended for women who have family members with BRCA-related cancers. BRCA-related cancers include:  Breast.  Ovarian.  Tubal.  Peritoneal cancers.  Results of the assessment will determine the need for genetic counseling and BRCA1 and BRCA2 testing. Cervical Cancer Routine pelvic examinations to screen for cervical cancer are no longer recommended for nonpregnant women who are considered low risk for cancer of the pelvic organs (ovaries, uterus, and vagina) and who do not have symptoms. A pelvic examination may be necessary if you have symptoms including those associated with pelvic infections. Ask your health care provider if a screening pelvic exam is right for you.   The Pap test is the screening test for cervical cancer for women who are considered at risk.  If you had a hysterectomy for a problem that was not cancer or a condition that could lead to cancer, then you no longer need Pap tests.  If you are older than 65 years, and you have had normal Pap tests for the past 10 years, you no longer need to have Pap tests.  If you have had past treatment for cervical cancer or a condition that could lead to cancer, you need Pap  tests and screening for cancer for at least 20 years after your treatment.  If you no longer get a Pap test, assess your risk factors if they change (such as having a new sexual partner). This can affect whether you should start being screened again.  Some women have medical problems that increase their chance of getting cervical cancer. If this is the case for you, your health care provider may recommend more frequent screening and Pap tests.  The human papillomavirus (HPV) test is another test that may be used for cervical cancer screening. The HPV test looks for the virus that can cause cell changes in the cervix. The cells collected during the Pap test can be tested for HPV.  The HPV test can be used to screen women 63 years of age and older. Getting tested for HPV can extend the interval between normal Pap tests from three to five years.  An HPV test also should be used to screen women of any age who have unclear Pap test results.  After 45 years of age, women should have HPV testing  as often as Pap tests.  Colorectal Cancer  This type of cancer can be detected and often prevented.  Routine colorectal cancer screening usually begins at 45 years of age and continues through 45 years of age.  Your health care provider may recommend screening at an earlier age if you have risk factors for colon cancer.  Your health care provider may also recommend using home test kits to check for hidden blood in the stool.  A small camera at the end of a tube can be used to examine your colon directly (sigmoidoscopy or colonoscopy). This is done to check for the earliest forms of colorectal cancer.  Routine screening usually begins at age 74.  Direct examination of the colon should be repeated every 5-10 years through 45 years of age. However, you may need to be screened more often if early forms of precancerous polyps or small growths are found. Skin Cancer  Check your skin from head to toe  regularly.  Tell your health care provider about any new moles or changes in moles, especially if there is a change in a mole's shape or color.  Also tell your health care provider if you have a mole that is larger than the size of a pencil eraser.  Always use sunscreen. Apply sunscreen liberally and repeatedly throughout the day.  Protect yourself by wearing long sleeves, pants, a wide-brimmed hat, and sunglasses whenever you are outside. HEART DISEASE, DIABETES, AND HIGH BLOOD PRESSURE   Have your blood pressure checked at least every 1-2 years. High blood pressure causes heart disease and increases the risk of stroke.  If you are between 62 years and 60 years old, ask your health care provider if you should take aspirin to prevent strokes.  Have regular diabetes screenings. This involves taking a blood sample to check your fasting blood sugar level.  If you are at a normal weight and have a low risk for diabetes, have this test once every three years after 45 years of age.  If you are overweight and have a high risk for diabetes, consider being tested at a younger age or more often. PREVENTING INFECTION  Hepatitis B  If you have a higher risk for hepatitis B, you should be screened for this virus. You are considered at high risk for hepatitis B if:  You were born in a country where hepatitis B is common. Ask your health care provider which countries are considered high risk.  Your parents were born in a high-risk country, and you have not been immunized against hepatitis B (hepatitis B vaccine).  You have HIV or AIDS.  You use needles to inject street drugs.  You live with someone who has hepatitis B.  You have had sex with someone who has hepatitis B.  You get hemodialysis treatment.  You take certain medicines for conditions, including cancer, organ transplantation, and autoimmune conditions. Hepatitis C  Blood testing is recommended for:  Everyone born from [birth year]  through 1967.  Anyone with known risk factors for hepatitis C. Sexually transmitted infections (STIs)  You should be screened for sexually transmitted infections (STIs) including gonorrhea and chlamydia if:  You are sexually active and are younger than 45 years of age.  You are older than 45 years of age and your health care provider tells you that you are at risk for this type of infection.  Your sexual activity has changed since you were last screened and you are at an increased risk for chlamydia or  gonorrhea. Ask your health care provider if you are at risk.  If you do not have HIV, but are at risk, it may be recommended that you take a prescription medicine daily to prevent HIV infection. This is called pre-exposure prophylaxis (PrEP). You are considered at risk if:  You are sexually active and do not regularly use condoms or know the HIV status of your partner(s).  You take drugs by injection.  You are sexually active with a partner who has HIV. Talk with your health care provider about whether you are at high risk of being infected with HIV. If you choose to begin PrEP, you should first be tested for HIV. You should then be tested every 3 months for as long as you are taking PrEP.  PREGNANCY   If you are premenopausal and you may become pregnant, ask your health care provider about preconception counseling.  If you may become pregnant, take 400 to 800 micrograms (mcg) of folic acid every day.  If you want to prevent pregnancy, talk to your health care provider about birth control (contraception). OSTEOPOROSIS AND MENOPAUSE   Osteoporosis is a disease in which the bones lose minerals and strength with aging. This can result in serious bone fractures. Your risk for osteoporosis can be identified using a bone density scan.  If you are 66 years of age or older, or if you are at risk for osteoporosis and fractures, ask your health care provider if you should be screened.  Ask your  health care provider whether you should take a calcium or vitamin D supplement to lower your risk for osteoporosis.  Menopause may have certain physical symptoms and risks.  Hormone replacement therapy may reduce some of these symptoms and risks. Talk to your health care provider about whether hormone replacement therapy is right for you.  HOME CARE INSTRUCTIONS   Schedule regular health, dental, and eye exams.  Stay current with your immunizations.   Do not use any tobacco products including cigarettes, chewing tobacco, or electronic cigarettes.  If you are pregnant, do not drink alcohol.  If you are breastfeeding, limit how much and how often you drink alcohol.  Limit alcohol intake to no more than 1 drink per day for nonpregnant women. One drink equals 12 ounces of beer, 5 ounces of wine, or 1 ounces of hard liquor.  Do not use street drugs.  Do not share needles.  Ask your health care provider for help if you need support or information about quitting drugs.  Tell your health care provider if you often feel depressed.  Tell your health care provider if you have ever been abused or do not feel safe at home. Document Released: 03/24/2011 Document Revised: 01/23/2014 Document Reviewed: 08/10/2013 [facility] Patient Information 2015 [place], [name]. This information is not intended to replace advice given to you by your health care provider. Make sure you discuss any questions you have with your health care provider.

## 2014-09-01 NOTE — Progress Notes (Signed)
Patient ID: [name], female   DOB: 1971-01-02, 43 y.o.   MRN: [id number]  CC: physical, shoulder pain  HPI:  Patient presents to clinic today for a physical exam. Patient reports that she has not gotten the [place] filled since she was ordered the medication.  She reports that she takes her insulin at least 4 days a week but tries to take her Metformin daily.  Patient describes several stressors that prevent her from taking care of herself.  Patient claims that her BP is elevated today due to not taking her medication before leaving the house today.  She has c/o of right shoulder pain with inability to raise arm above her head.  Last ER visit 4 weeks ago revealed tendonitis.  She has a f/u appointment with Orthopedics this week.    PM hx: OSA,T2DM, HTN, obesity Surgical hx: Gallbladder removed 7/15, hysterectomy, umbilical hernia removed, appendectomy, "surgery on ENT", and "bilateral hands" Social hx: former smoker, denies alcohol and drug use    Allergies  Allergen Reactions  . Reglan [Metoclopramide] Other (See Comments)    Panic attack   Past Medical History  Diagnosis Date  . Hypertension   . Hypokalemia   . Asthma   . DM (diabetes mellitus)     INSULIN DEPENDENT  . Depression   . GERD (gastroesophageal reflux disease)   . Headache(784.0)   . Chronic back pain   . Chronic chest pain    Current Outpatient Prescriptions on File Prior to Visit  Medication Sig Dispense Refill  . albuterol (PROVENTIL HFA;VENTOLIN HFA) 108 (90 BASE) MCG/ACT inhaler Inhale 2 puffs into the lungs every 6 (six) hours as needed for wheezing or shortness of breath.    Marland Kitchen albuterol (PROVENTIL) (2.5 MG/3ML) 0.083% nebulizer solution Take 2.5 mg by nebulization every 6 (six) hours as needed for wheezing or shortness of breath.    Marland Kitchen amLODipine (NORVASC) 10 MG tablet Take 10 mg by mouth daily.    Marland Kitchen aspirin EC 325 MG tablet Take 325 mg by mouth daily.    Marland Kitchen aspirin-acetaminophen-caffeine (EXCEDRIN  MIGRAINE) 250-250-65 MG per tablet Take 2 tablets by mouth every 6 (six) hours as needed for headache.    . benazepril (LOTENSIN) 20 MG tablet Take 20 mg by mouth daily.    . carvedilol (COREG) 3.125 MG tablet Take 3.125 mg by mouth 2 (two) times daily with a meal.    . cyclobenzaprine (FLEXERIL) 5 MG tablet Take 5 mg by mouth 3 (three) times daily as needed for muscle spasms.    . Dapagliflozin Propanediol (FARXIGA) 5 MG TABS Take 5 mg by mouth daily.    . Fluticasone-Salmeterol (ADVAIR) 250-50 MCG/DOSE AEPB Inhale 1 puff into the lungs 2 times daily at 12 noon and 4 pm. 60 each 0  . gabapentin (NEURONTIN) 300 MG capsule Take 1 capsule (300 mg total) by mouth 3 (three) times daily. 90 capsule 3  . hydrochlorothiazide (HYDRODIURIL) 25 MG tablet Take 25 mg by mouth daily.    . insulin glargine (LANTUS) 100 UNIT/ML injection Inject 0.18 mLs (18 Units total) into the skin at bedtime. 10 mL 11  . ipratropium (ATROVENT) 0.02 % nebulizer solution Take 0.5 mg by nebulization every 6 (six) hours as needed for wheezing or shortness of breath.     . methocarbamol (ROBAXIN) 500 MG tablet Take 1 tablet (500 mg total) by mouth 2 (two) times daily. 20 tablet 0  . oxyCODONE-acetaminophen (PERCOCET/ROXICET) 5-325 MG per tablet Take 1-2 tablets by mouth every  6 (six) hours as needed for severe pain. 12 tablet 0  . sertraline (ZOLOFT) 25 MG tablet Take 25 mg by mouth daily.    . simvastatin (ZOCOR) 10 MG tablet Take 10 mg by mouth daily at 6 PM.     . SUMAtriptan (IMITREX) 25 MG tablet Take 25 mg by mouth every 2 (two) hours as needed for migraine or headache. May repeat in 2 hours if headache persists or recurs.    . topiramate (TOPAMAX) 50 MG tablet Take 50 mg by mouth 2 (two) times daily.     No current facility-administered medications on file prior to visit.   Family History  Problem Relation Age of Onset  . Heart attack Mother   . Stroke Mother   . Diabetes Mother   . Hypertension Mother   . Arthritis  Mother   . Stroke Father   . Hypertension Sister   . Diabetes Sister   . Seizures Brother   . Diabetes Brother    History   Social History  . Marital Status: Married    Spouse Name: N/A    Number of Children: N/A  . Years of Education: N/A   Occupational History  . Not on file.   Social History Main Topics  . Smoking status: Former Smoker -- 0.30 packs/day for 22 years    Types: Cigarettes    Quit date: 05/01/2011  . Smokeless tobacco: Never Used  . Alcohol Use: Yes     Comment: rare  . Drug Use: No  . Sexual Activity: Not on file   Other Topics Concern  . Not on file   Social History Narrative    Review of Systems  Constitutional: Negative for weight loss.  Eyes: Positive for blurred vision.  Respiratory: Negative.   Cardiovascular: Negative.   Gastrointestinal: Positive for nausea, vomiting (not in 3 days), abdominal pain and diarrhea. Negative for heartburn and constipation.  Genitourinary: Positive for frequency.  Musculoskeletal:       Bilateral knee pain and left shoulder tendonitis   Skin: Negative.   Neurological: Positive for dizziness, tingling and headaches.  Endo/Heme/Allergies: Positive for polydipsia.  Psychiatric/Behavioral: The patient is nervous/anxious (stress from family).   All other systems reviewed and are negative.      Objective:   Filed Vitals:   09/01/14 1143  BP: 149/100  Pulse: 96  Temp: 98.1 F (36.7 C)  Resp: 14    Physical Exam: Constitutional: Patient appears well-developed and well-nourished. No distress. HENT: Normocephalic, atraumatic, External right and left ear normal. Oropharynx is clear and moist.  Eyes: Conjunctivae and EOM are normal. PERRLA, no scleral icterus. Neck: Normal ROM. Neck supple. No JVD. No tracheal deviation. No thyromegaly. CVS: RRR, S1/S2 +, no murmurs, no gallops, no carotid bruit.  Pulmonary: Effort and breath sounds normal, no stridor, rhonchi, wheezes, rales.  Abdominal: Soft. BS +,  no  distension, tenderness, rebound or guarding.  Musculoskeletal: Normal range of motion. No edema and no tenderness.  Lymphadenopathy: No lymphadenopathy noted, cervical Neuro: Alert. Normal reflexes, muscle tone coordination. No cranial nerve deficit. Skin: Skin is warm and dry. No rash noted. Not diaphoretic. No erythema. No pallor. Psychiatric: Normal mood and affect. Behavior, judgment, thought content normal.  Physical Exam  Pulmonary/Chest: Right breast exhibits no mass. Left breast exhibits no mass.  Genitourinary: Vagina normal. No breast tenderness or discharge. Cervix exhibits no motion tenderness, no discharge and no friability.  Vaginal dryness   Lymphadenopathy:       Right: No  inguinal adenopathy present.       Left: No inguinal adenopathy present.     Lab Results  Component Value Date   WBC 15.0* 06/06/2014   HGB 11.5* 06/06/2014   HCT 34.7* 06/06/2014   MCV 87.0 06/06/2014   PLT 316 06/06/2014   Lab Results  Component Value Date   CREATININE 0.93 06/06/2014   BUN 12 06/06/2014   NA 140 06/06/2014   K 4.2 06/06/2014   CL 108 06/06/2014   CO2 18* 06/06/2014    Lab Results  Component Value Date   HGBA1C 9.5 09/01/2014   Lipid Panel     Component Value Date/Time   CHOL 170 03/01/2014 0511   TRIG 182* 03/01/2014 0511   HDL 47 03/01/2014 0511   CHOLHDL 3.6 03/01/2014 0511   VLDL 36 03/01/2014 0511   LDLCALC 87 03/01/2014 0511       Assessment and plan:   Brytni was seen today for follow-up.  Diagnoses and associated orders for this visit:  Type 2 diabetes mellitus without complication - Glucose (CBG) - HgB A1c Patients diabetes remains uncontrolled as evidence by hemoglobin a1c >8.  Patient has been non-compliant with medication regimen. Stressed the multiple complications associated with uncontrolled diabetes.  Patient will stay on current medication dose and report back to clinic with cbg log in 2 weeks.  Essential hypertension, benign Patient  blood pressure remains elevated today, stressed medication compliance. No changes to regimen today. Stressed diet changes, regular exercise regimen, and modifiable risk factors. Will follow up with CMP as needed, Will follow up with patient in 3-6 months.   Left shoulder pain F/u with ortho  Annual physical exam - Cytology - PAP [place] - Cervicovaginal ancillary only - HIV antibody (with reflex) - RPR    Return in about 3 months (around 12/01/2014) for Diabetes Mellitus.        Chari Manning, [facility] [phone] 09/01/2014, 12:01 PM

## 2014-09-02 LAB — HIV ANTIBODY (ROUTINE TESTING W REFLEX): HIV 1&2 Ab, 4th Generation: NONREACTIVE

## 2014-09-02 LAB — RPR

## 2014-09-04 LAB — CERVICOVAGINAL ANCILLARY ONLY
Chlamydia: NEGATIVE
NEISSERIA GONORRHEA: NEGATIVE
WET PREP (BD AFFIRM): NEGATIVE
WET PREP (BD AFFIRM): NEGATIVE
Wet Prep (BD Affirm): POSITIVE — AB

## 2014-09-04 LAB — CYTOLOGY - PAP

## 2014-09-06 ENCOUNTER — Telehealth: Payer: Self-pay | Admitting: Emergency Medicine

## 2014-09-06 MED ORDER — METRONIDAZOLE 500 MG PO TABS: 500.0000 mg | ORAL_TABLET | Freq: Two times a day (BID) | ORAL | Status: DC

## 2014-09-06 NOTE — Telephone Encounter (Signed)
-----   Message from Lance Bosch, NP sent at 09/03/2014 10:17 PM EST ----- HIV/Syphilis negative

## 2014-09-06 NOTE — Telephone Encounter (Signed)
-----   Message from Lance Bosch, NP sent at 09/05/2014  1:14 PM EST ----- HIV/Syphilis negative STD.   Patient positive for Bacterial Vaginosis . Please explain this is not a STD, but a imbalance of the vaginal pH. Please send Flagyl 500 mg BID for 7 days. No refills, no alcohol while on this medication.

## 2014-09-06 NOTE — Telephone Encounter (Signed)
Pt given pap smear results with education on BV. Medication Flagyl 500 mg tab prescribed to [place] Pt instructed to refrain from alcohol while taking medication

## 2014-09-06 NOTE — Telephone Encounter (Signed)
Pt given negative HIV/Syphilis results

## 2014-09-12 ENCOUNTER — Telehealth: Payer: Self-pay | Admitting: Emergency Medicine

## 2014-09-12 NOTE — Telephone Encounter (Signed)
Pt given negative pap smear results with f/u in 3 yrs recommended

## 2014-09-12 NOTE — Telephone Encounter (Signed)
-----   Message from Lance Bosch, NP sent at 09/07/2014  5:37 PM EST ----- Patient pap is negative for malignancies. Will repeat in 3 years.

## 2014-09-18 ENCOUNTER — Encounter: Payer: Self-pay | Admitting: *Deleted

## 2014-09-19 ENCOUNTER — Ambulatory Visit (INDEPENDENT_AMBULATORY_CARE_PROVIDER_SITE_OTHER): Payer: Self-pay | Admitting: Neurology

## 2014-09-19 ENCOUNTER — Encounter: Payer: Self-pay | Admitting: Neurology

## 2014-09-19 VITALS — BP 140/100 | HR 70 | Temp 100.7°F | Resp 20 | Ht 62.0 in | Wt 236.5 lb

## 2014-09-19 DIAGNOSIS — G4441 Drug-induced headache, not elsewhere classified, intractable: Secondary | ICD-10-CM

## 2014-09-19 DIAGNOSIS — G43709 Chronic migraine without aura, not intractable, without status migrainosus: Secondary | ICD-10-CM

## 2014-09-19 DIAGNOSIS — IMO0002 Reserved for concepts with insufficient information to code with codable children: Secondary | ICD-10-CM

## 2014-09-19 DIAGNOSIS — G4440 Drug-induced headache, not elsewhere classified, not intractable: Secondary | ICD-10-CM

## 2014-09-19 MED ORDER — SUMATRIPTAN SUCCINATE 100 MG PO TABS: ORAL_TABLET | ORAL | Status: DC

## 2014-09-19 MED ORDER — SERTRALINE HCL 50 MG PO TABS: 50.0000 mg | ORAL_TABLET | Freq: Every day | ORAL | Status: DC

## 2014-09-19 NOTE — Progress Notes (Signed)
NEUROLOGY CONSULTATION NOTE  AMAAL DIMARTINO MRN: [id number] DOB: Jan 20, 1971  Referring provider: Chari Manning, NP Primary care provider: Chari Manning, NP  Reason for consult:  Headache  HISTORY OF PRESENT ILLNESS: [name] is a 43 year old right-handed woman with hypertension, type II diabetes mellitus, hyperlipidemia, OSA (not currently using CPAP), back pain, asthma, depression, and carpal tunnel syndrome status post surgery who presents for headache.  Records, labs and CT reviewed.  She is accompanied by her husband who provides some history.  Onset:  Many years Location:  Bi-frontal/temporal/back of head Quality:  Throbbing, sharp shooting pain Intensity:  10/10 Aura:  no Prodrome:  no Associated symptoms:  Nausea, vomiting, photophobia, phonophobia, blurred vision Duration:  Almost constant Frequency:  20-25 headache days per month Triggers/exacerbating factors:  none Relieving factors:  none Activity:  Cannot function 4-5 days per month.  Past abortive therapy:  Goody, BC, Tylenol, Percocet Past preventative therapy:  None  Current abortive therapy:  sumatriptan 25mg  (takes edge off), Advil (takes edge off), Aleve (takes edge off).  She takes a pain reliever daily. Current preventative therapy:  Topamax 50mg  twice daily, Zoloft 25mg  daily (for depression) Other medication:  gabapentin 400mg  three times daily, Percocet, cyclobenzaprine 5mg   Caffeine:  Soda (not daily) Alcohol:  occasionally Smoker:  no Diet:  Tries to eat healthy Exercise:  no Depression/stress:  yes Sleep hygiene:  Good when headache not severe Family history of headache:  Sister, brother  Recent Hgb A1c from earlier this month was 9.5. CT of the head was performed on 09/24/13 for headache, which was unremarkable except for chronic right maxillary sinusitis.  PAST MEDICAL HISTORY: Past Medical History  Diagnosis Date  . Hypertension   . Hypokalemia   . Asthma   . DM (diabetes mellitus)      INSULIN DEPENDENT  . Depression   . GERD (gastroesophageal reflux disease)   . Headache(784.0)   . Chronic back pain   . Chronic chest pain     PAST SURGICAL HISTORY: Past Surgical History  Procedure Laterality Date  . Cesarean section      x 3  . Appendectomy    . Vesicovaginal fistula closure w/ tah  2009  . Hernia repair    . Cholecystectomy N/A 03/02/2014    Procedure: LAPAROSCOPIC CHOLECYSTECTOMY;  Surgeon: Joyice Faster. Cornett, MD;  Location: [facility];  Service: General;  Laterality: N/A;    MEDICATIONS: Current Outpatient Prescriptions on File Prior to Visit  Medication Sig Dispense Refill  . albuterol (PROVENTIL HFA;VENTOLIN HFA) 108 (90 BASE) MCG/ACT inhaler Inhale 2 puffs into the lungs every 6 (six) hours as needed for wheezing or shortness of breath.    Marland Kitchen albuterol (PROVENTIL) (2.5 MG/3ML) 0.083% nebulizer solution Take 2.5 mg by nebulization every 6 (six) hours as needed for wheezing or shortness of breath.    Marland Kitchen amLODipine (NORVASC) 10 MG tablet Take 10 mg by mouth daily.    Marland Kitchen aspirin EC 325 MG tablet Take 325 mg by mouth daily.    Marland Kitchen aspirin-acetaminophen-caffeine (EXCEDRIN MIGRAINE) 250-250-65 MG per tablet Take 2 tablets by mouth every 6 (six) hours as needed for headache.    . benazepril (LOTENSIN) 20 MG tablet Take 20 mg by mouth daily.    . carvedilol (COREG) 3.125 MG tablet Take 3.125 mg by mouth 2 (two) times daily with a meal.    . cyclobenzaprine (FLEXERIL) 5 MG tablet Take 5 mg by mouth 3 (three) times daily as needed for muscle spasms.    Marland Kitchen  Dapagliflozin Propanediol (FARXIGA) 5 MG TABS Take 5 mg by mouth daily.    . Fluticasone-Salmeterol (ADVAIR) 250-50 MCG/DOSE AEPB Inhale 1 puff into the lungs 2 times daily at 12 noon and 4 pm. 60 each 0  . gabapentin (NEURONTIN) 300 MG capsule Take 1 capsule (300 mg total) by mouth 3 (three) times daily. 90 capsule 3  . hydrochlorothiazide (HYDRODIURIL) 25 MG tablet Take 25 mg by mouth daily.    . insulin glargine (LANTUS)  100 UNIT/ML injection Inject 0.18 mLs (18 Units total) into the skin at bedtime. 10 mL 11  . ipratropium (ATROVENT) 0.02 % nebulizer solution Take 0.5 mg by nebulization every 6 (six) hours as needed for wheezing or shortness of breath.     . methocarbamol (ROBAXIN) 500 MG tablet Take 1 tablet (500 mg total) by mouth 2 (two) times daily. 20 tablet 0  . metroNIDAZOLE (FLAGYL) 500 MG tablet Take 1 tablet (500 mg total) by mouth 2 (two) times daily. 14 tablet 0  . oxyCODONE-acetaminophen (PERCOCET/ROXICET) 5-325 MG per tablet Take 1-2 tablets by mouth every 6 (six) hours as needed for severe pain. 12 tablet 0  . simvastatin (ZOCOR) 10 MG tablet Take 10 mg by mouth daily at 6 PM.     . topiramate (TOPAMAX) 50 MG tablet Take 50 mg by mouth 2 (two) times daily.     No current facility-administered medications on file prior to visit.    ALLERGIES: Allergies  Allergen Reactions  . Reglan [Metoclopramide] Other (See Comments)    Panic attack    FAMILY HISTORY: Family History  Problem Relation Age of Onset  . Heart attack Mother   . Stroke Mother   . Diabetes Mother   . Hypertension Mother   . Arthritis Mother   . Stroke Father   . Hypertension Sister   . Diabetes Sister   . Seizures Brother   . Diabetes Brother     SOCIAL HISTORY: History   Social History  . Marital Status: Married    Spouse Name: N/A    Number of Children: N/A  . Years of Education: N/A   Occupational History  . Not on file.   Social History Main Topics  . Smoking status: Former Smoker -- 0.30 packs/day for 22 years    Types: Cigarettes    Quit date: 05/01/2011  . Smokeless tobacco: Never Used  . Alcohol Use: 0.0 oz/week    0 Not specified per week     Comment: rare  . Drug Use: No  . Sexual Activity:    Partners: Male   Other Topics Concern  . Not on file   Social History Narrative    REVIEW OF SYSTEMS: Constitutional: Low-grade fever Eyes: No visual changes, double vision, eye pain Ear,  nose and throat: No hearing loss, ear pain, nasal congestion, sore throat Cardiovascular: No chest pain, palpitations Respiratory:  No shortness of breath at rest or with exertion, wheezes GastrointestinaI: No nausea, vomiting, diarrhea, abdominal pain, fecal incontinence Genitourinary:  No dysuria, urinary retention or frequency Musculoskeletal:  No neck pain, back pain Integumentary: No rash, pruritus, skin lesions Neurological: as above Psychiatric: No depression, insomnia, anxiety Endocrine: No palpitations, fatigue, diaphoresis, mood swings, change in appetite, change in weight, increased thirst Hematologic/Lymphatic:  No anemia, purpura, petechiae. Allergic/Immunologic: no itchy/runny eyes, nasal congestion, recent allergic reactions, rashes  PHYSICAL EXAM: Filed Vitals:   09/19/14 1420  BP: 140/100  Pulse: 70  Temp: 100.7 F (38.2 C)  Resp: 20   General: No  acute distress Head:  Normocephalic/atraumatic Eyes:  fundi unremarkable, without vessel changes, exudates, hemorrhages or papilledema. Neck: supple, no paraspinal tenderness, full range of motion Back: No paraspinal tenderness Heart: regular rate and rhythm Lungs: Clear to auscultation bilaterally. Vascular: No carotid bruits. Neurological Exam: Mental status: alert and oriented to person, place, and time, recent and remote memory intact, fund of knowledge intact, attention and concentration intact, speech fluent and not dysarthric, language intact. Cranial nerves: CN I: not tested CN II: pupils equal, round and reactive to light, visual fields intact, fundi unremarkable, without vessel changes, exudates, hemorrhages or papilledema. CN III, IV, VI:  full range of motion, no nystagmus, no ptosis CN V: facial sensation intact CN VII: upper and lower face symmetric CN VIII: hearing intact CN IX, X: gag intact, uvula midline CN XI: sternocleidomastoid and trapezius muscles intact CN XII: tongue midline Bulk & Tone:  normal, no fasciculations. Motor:  5/5 throughout Sensation:  Pinprick and vibration intact Deep Tendon Reflexes:  2+ throughout, toes downgoing. Finger to nose testing:  No dysmetria Heel to shin:  No dysmetria Gait:  Normal station and stride.  Able to turn and walk in tandem. Romberg negative.  IMPRESSION: Chronic migraine without aura Medication-overuse headache.  PLAN: 1. Increase Zoloft to 50mg  daily 2. Taper off Topamax 3. For abortive therapy, sumatriptan 100mg  with or without naproxen 500mg  4. Stop daily use of Advil, Excedrin and Aleve.  Limit use of pain relievers to no more than 2 days out of the week 5.  Headache diary 6.  If fever persists, contact [facility] 7.  Follow up in 3 months.  Call in 4 weeks with update.  Thank you for allowing me to take part in the care of this patient.  Metta Clines, DO  CC: Chari Manning, NP

## 2014-09-19 NOTE — Patient Instructions (Signed)
1.  We will increase Zoloft to 50mg  daily.  I prescribed 50mg  tablets, so just take 1 tablet daily.  Call in 4 weeks with update and we can adjust dose if needed. 2.  We will stop the topamax.  Take 50mg  at bedtime for 7 days, then stop. 3.  At earliest onset of headache, take sumatriptan 100mg .  May repeat once in 2 hours if needed.  Do not exceed 2 tablets in 24 hours.  If ineffective, next time you have a headache, take the first dose of sumatriptan along with naproxen 500mg  (it's over the counter). 4.  Stop Advil and Excedrin.  May take no more than 2 days out of the week if needed (more than that can cause rebound headache). 5.  Keep headache diary 6.  Follow up in 3 months but call in 4 weeks with update. 7.  Start routine exercise and diet and weight loss.

## 2014-09-25 ENCOUNTER — Telehealth: Payer: Self-pay | Admitting: Internal Medicine

## 2014-09-25 NOTE — Telephone Encounter (Signed)
May send diflucan 150 mg po once. No refills

## 2014-09-25 NOTE — Telephone Encounter (Signed)
Patient called to request a Rx for a yeast infection, patient stated that she recently finished a dose of antibiotics. Please f/u with pt.

## 2014-09-25 NOTE — Telephone Encounter (Signed)
Requested Rx for yeast infection, due to side effect of antibiotics

## 2014-09-26 ENCOUNTER — Telehealth: Payer: Self-pay | Admitting: *Deleted

## 2014-09-26 ENCOUNTER — Other Ambulatory Visit: Payer: Self-pay | Admitting: *Deleted

## 2014-09-26 IMAGING — CR DG CHEST 2V
2 series · 2 of 2 positions shown · non-contrast
Comparison: 11/10/13

CLINICAL DATA: Chest pain.  Shortness of breath.  Asthma.

EXAM:
CHEST  2 VIEW

[w chest pa]
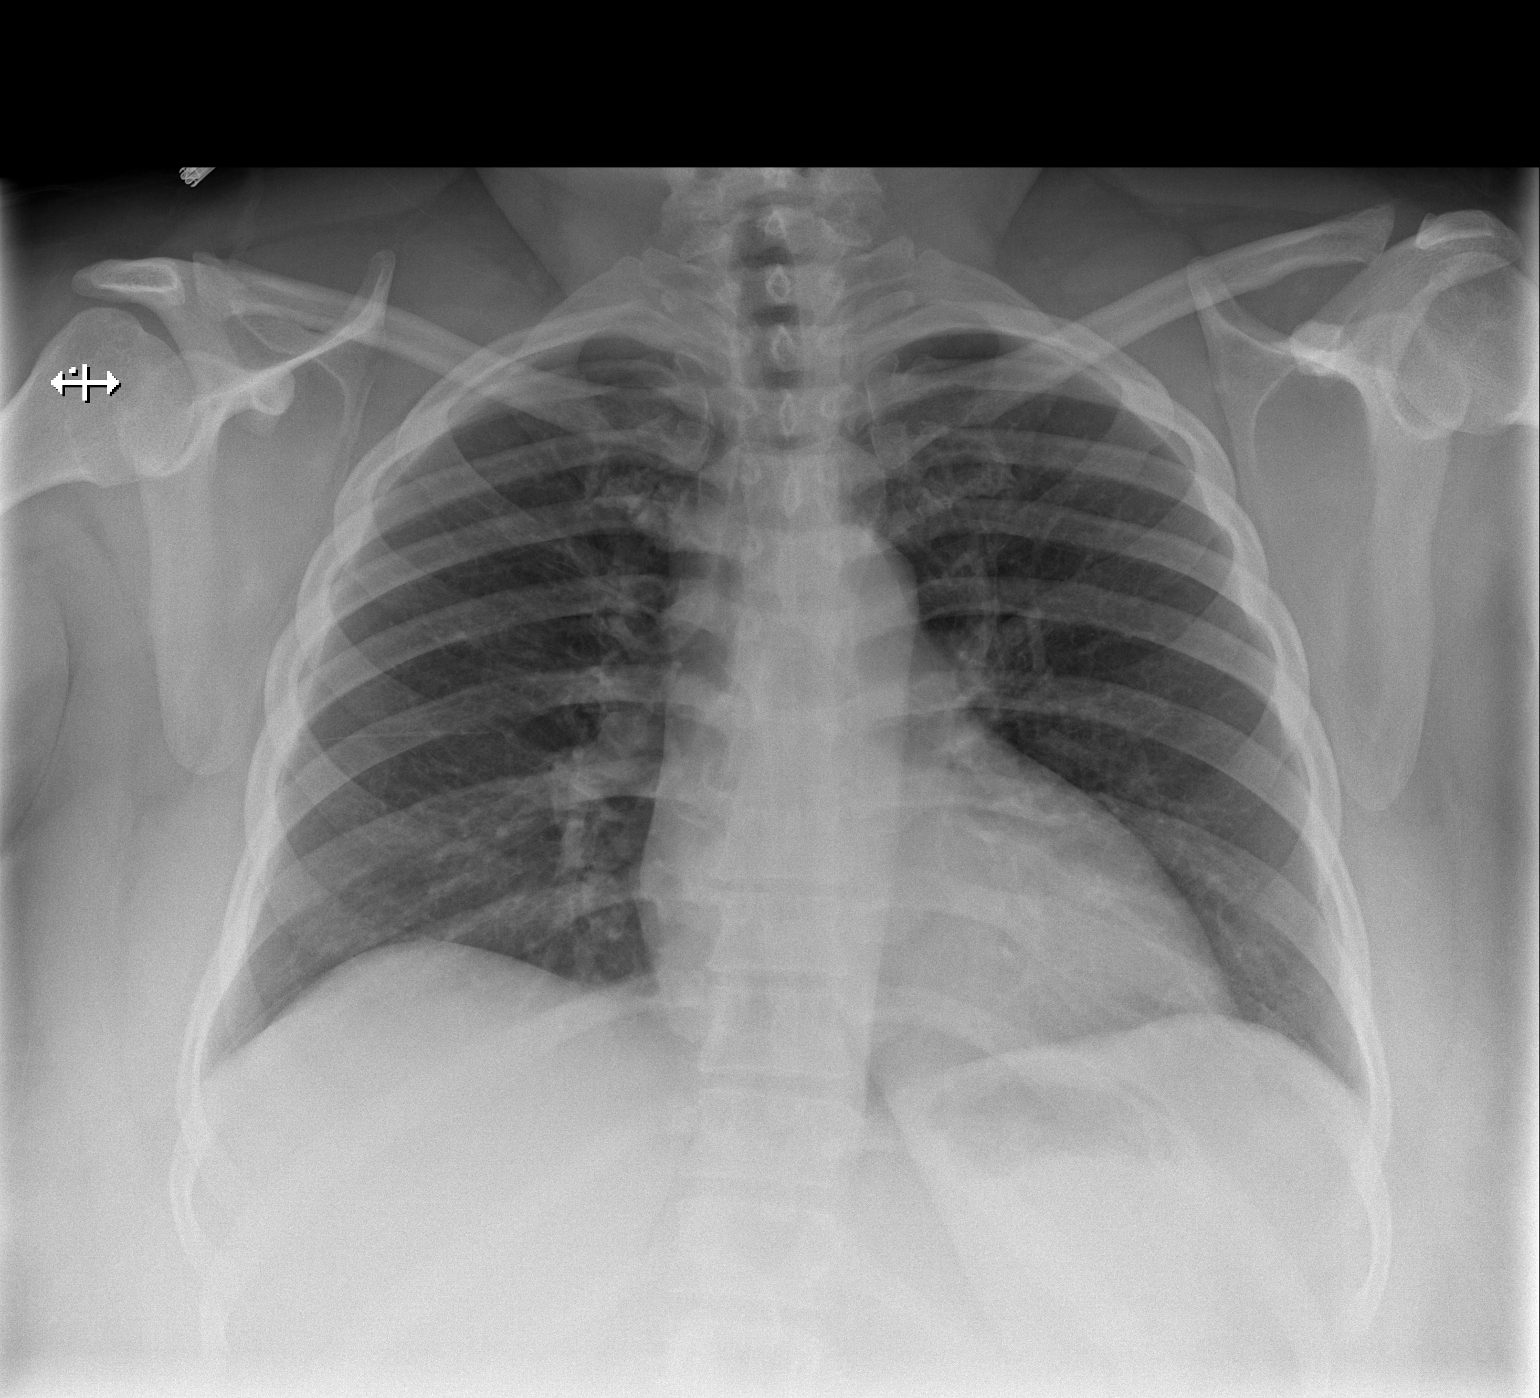

[w chest lat]
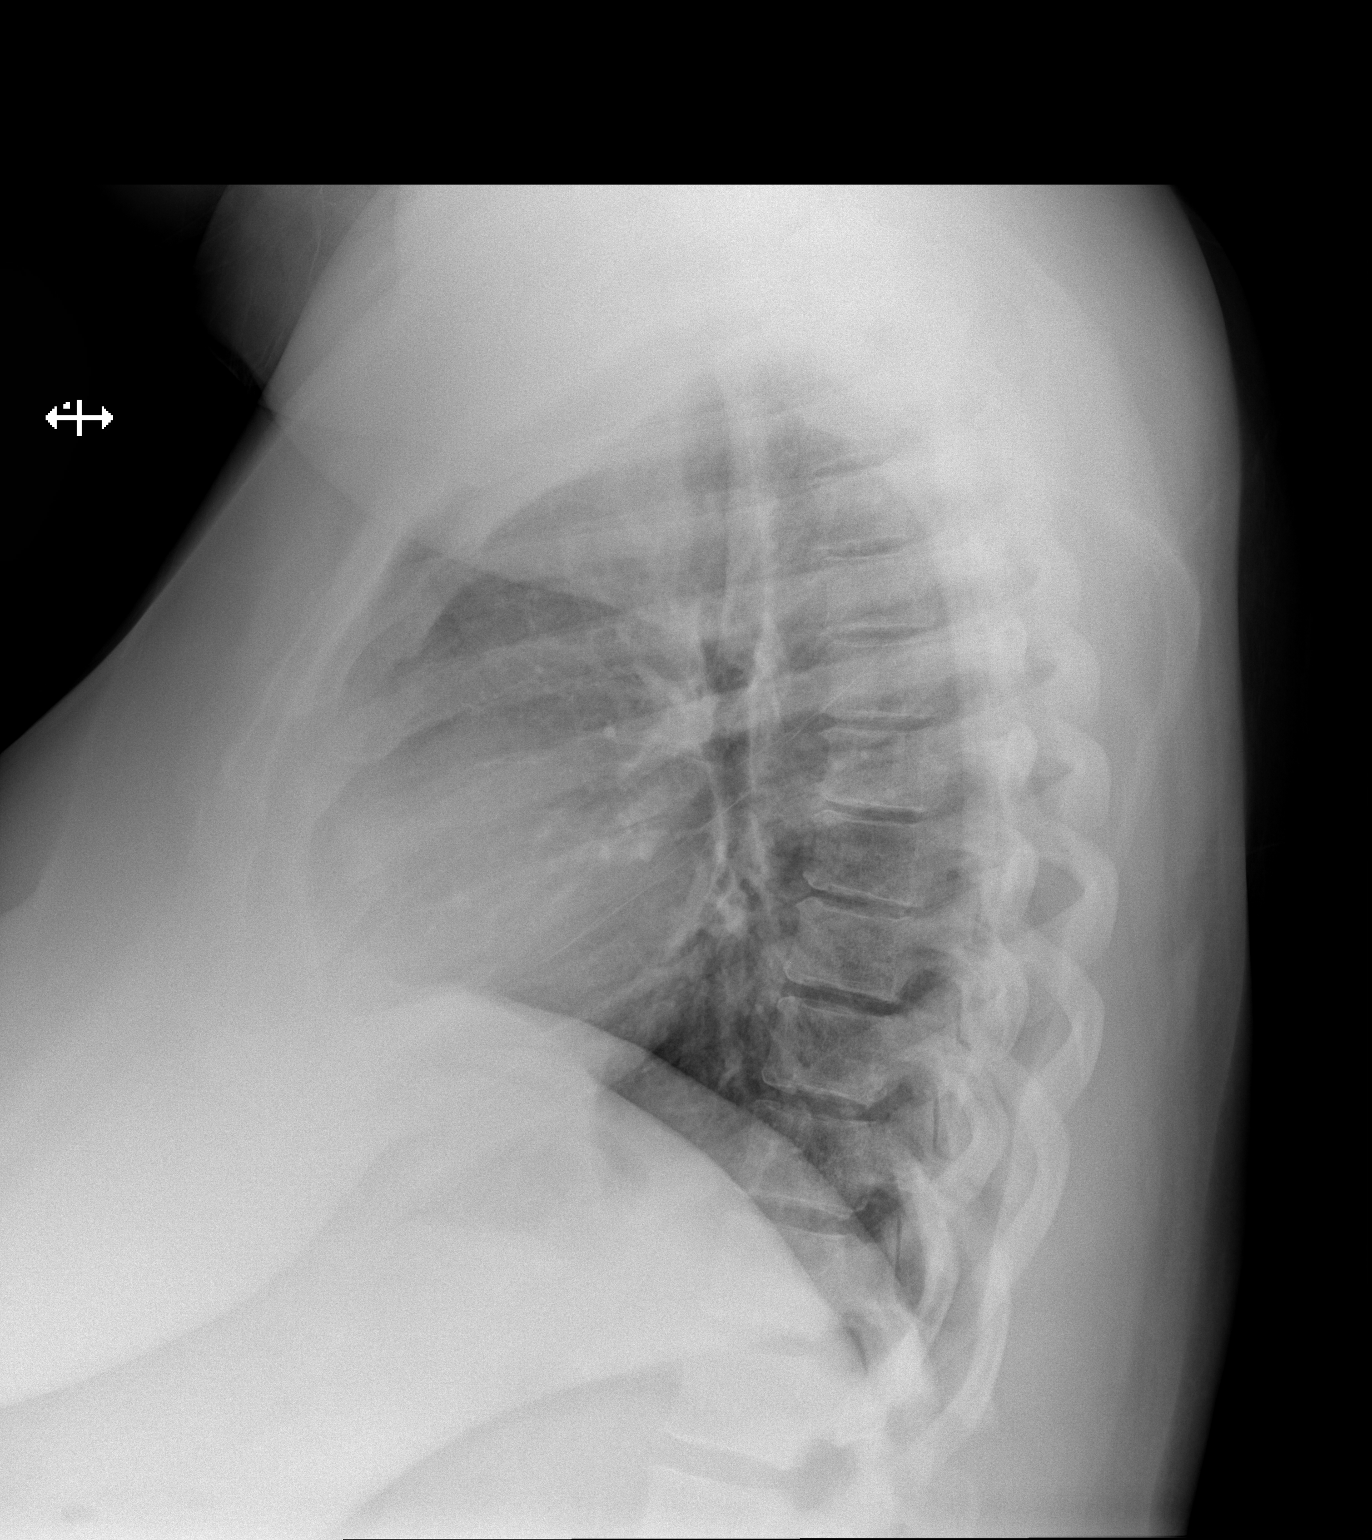

[2 of 2 positions shown; findings below may reference images not displayed]

FINDINGS: The heart size and mediastinal contours are within normal limits.
Both lungs are clear. The visualized skeletal structures are
unremarkable.
IMPRESSION: No active cardiopulmonary disease.

## 2014-09-26 MED ORDER — FLUCONAZOLE 150 MG PO TABS: 150.0000 mg | ORAL_TABLET | Freq: Every day | ORAL | Status: DC

## 2014-09-26 NOTE — Telephone Encounter (Signed)
Rx Diflucan 150mg  was e-scribe to [facility]  Pt aware

## 2014-09-28 IMAGING — US US ABDOMEN COMPLETE
1 series · 14 of 25 positions shown · non-contrast
Comparison: December 10, 2010

CLINICAL DATA: Pain with nausea and vomiting

EXAM:
ULTRASOUND ABDOMEN COMPLETE

[Series 1: us abdomen complete · 0.27mm/px · 14 of 92 slices shown]
[im 1/92]
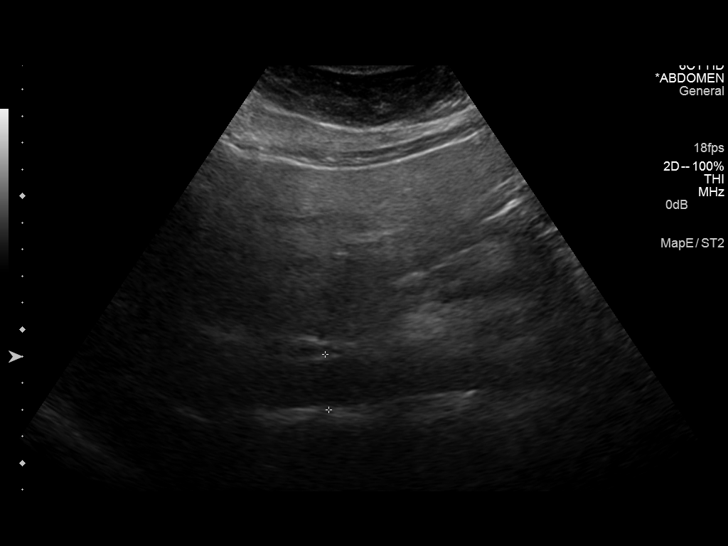
[im 8/92]
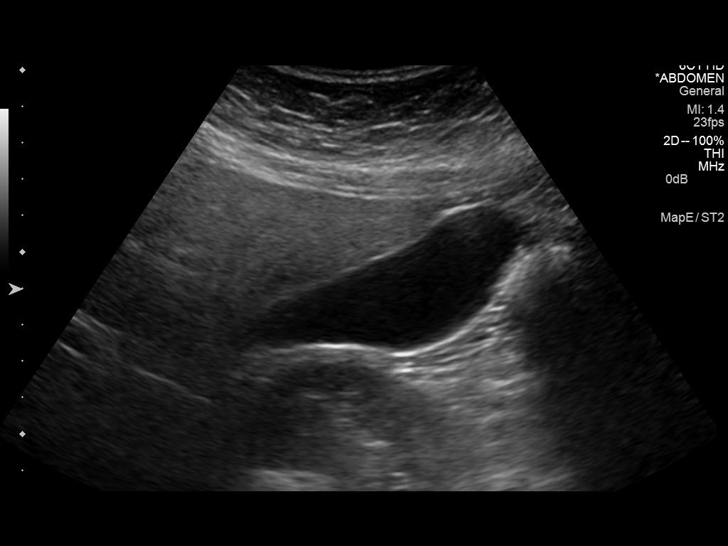
[im 16/92]
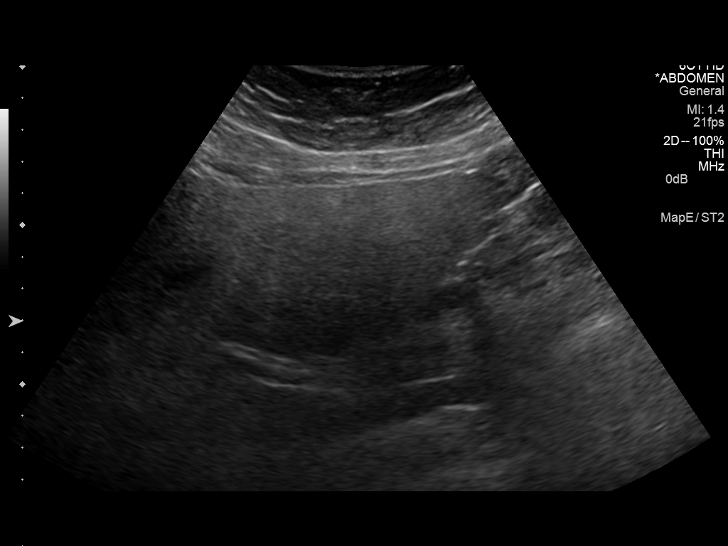
[im 23/92]
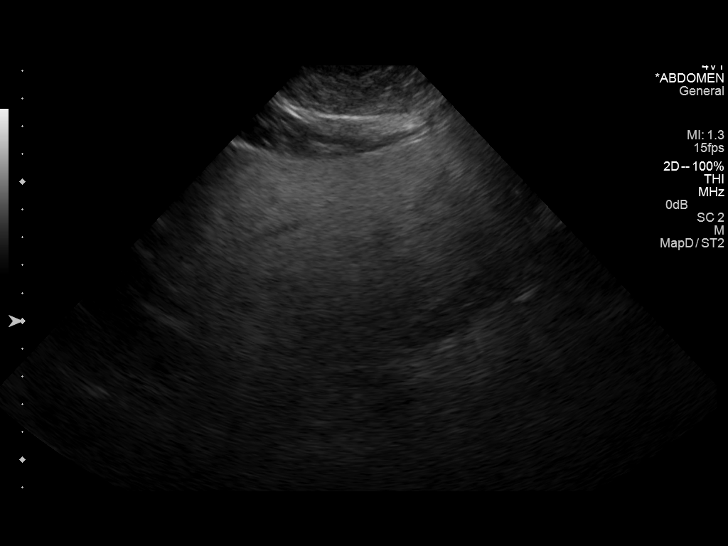
[im 31/92]
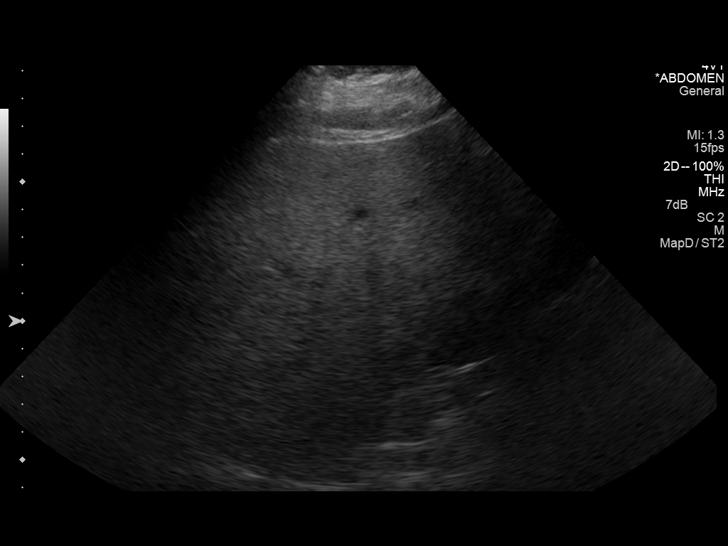
[im 35/92]
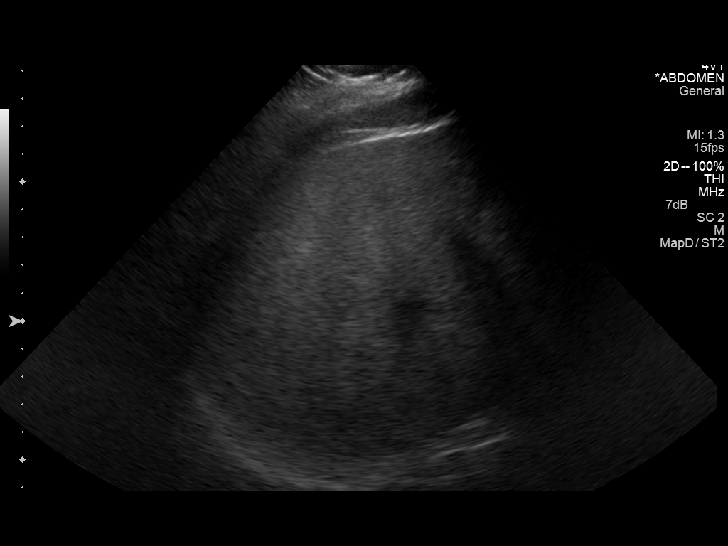
[im 42/92]
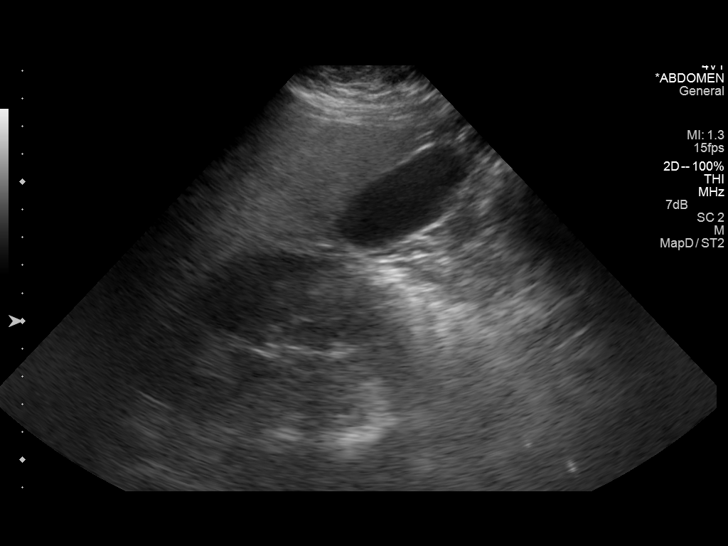
[im 50/92]
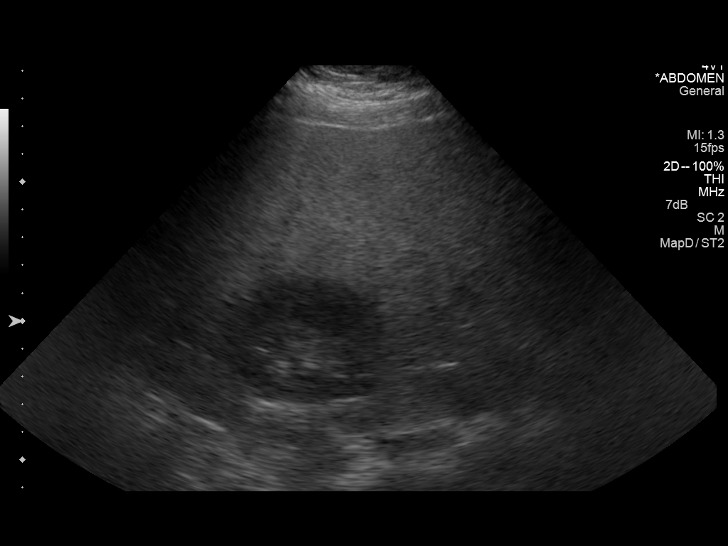
[im 57/92]
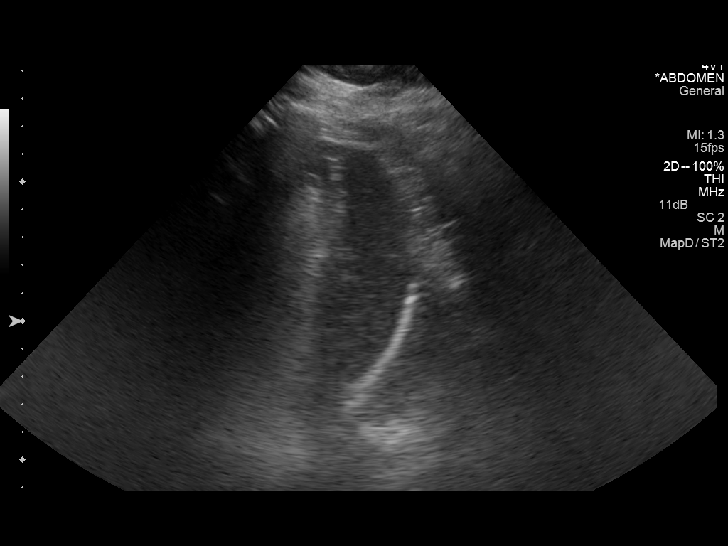
[im 61/92]
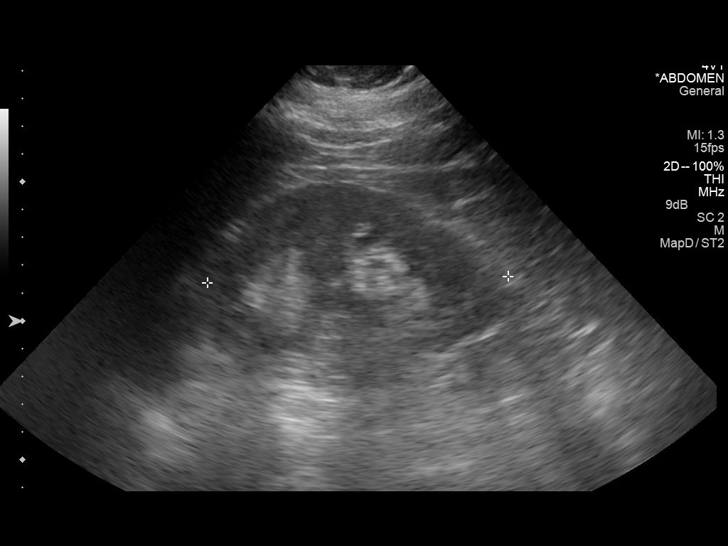
[im 69/92]
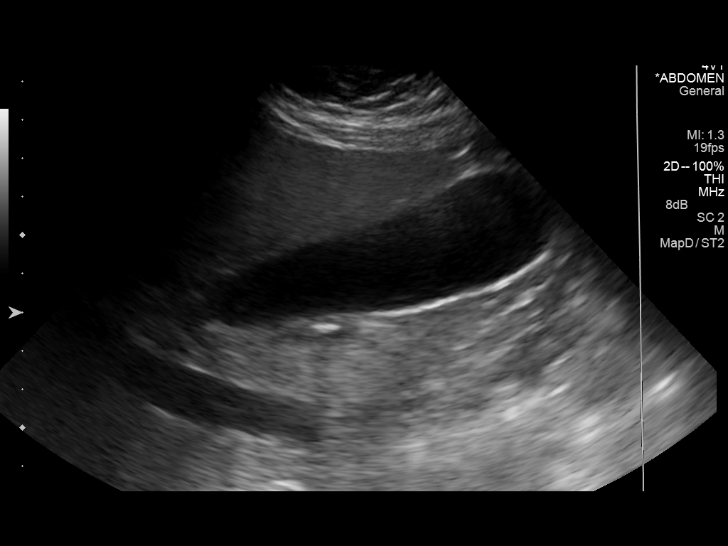
[im 76/92]
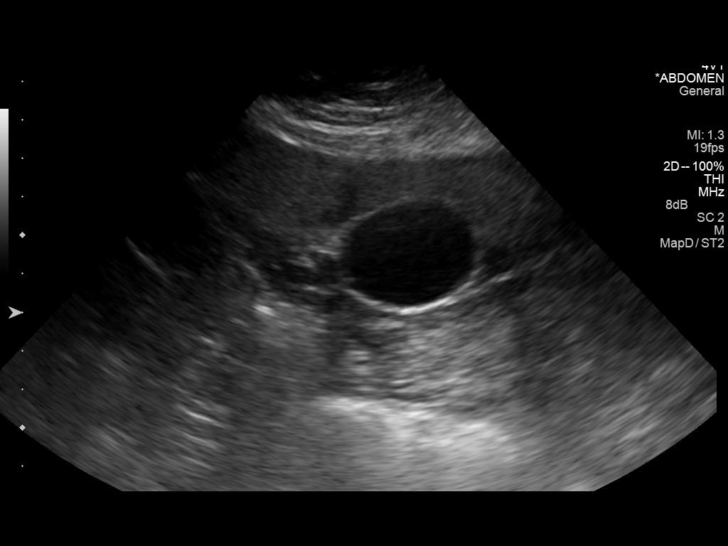
[im 84/92]
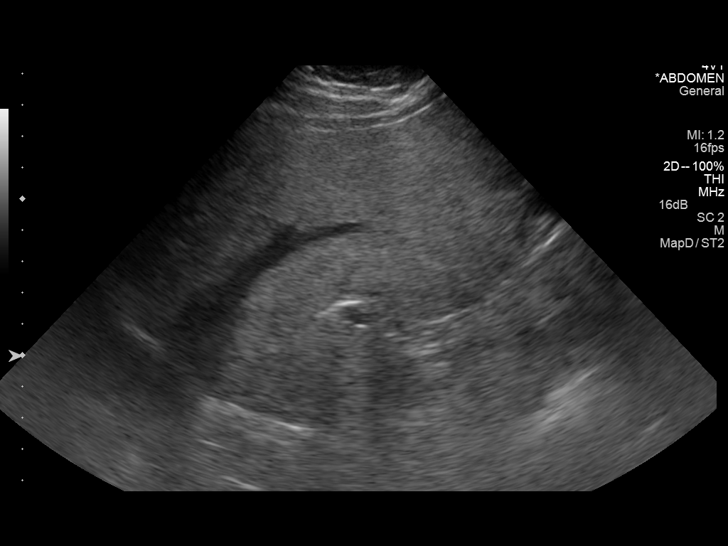
[im 92/92]
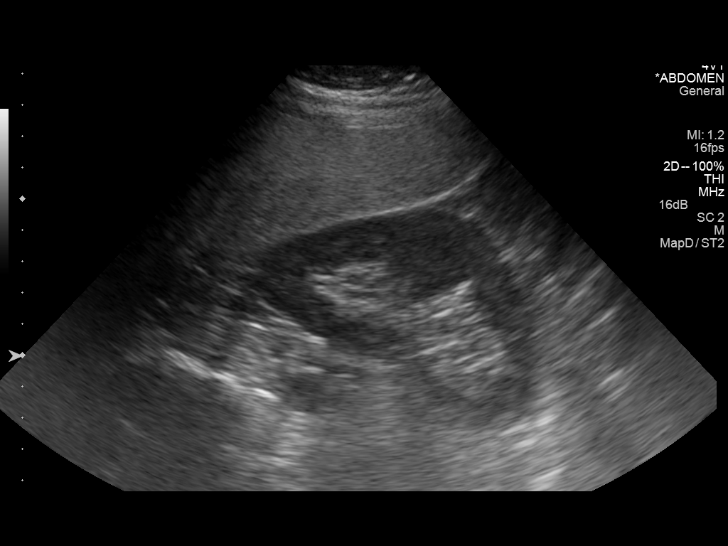

[14 of 25 positions shown; findings below may reference images not displayed]

FINDINGS: Gallbladder:

No gallstones or wall thickening visualized. There is no
pericholecystic fluid. No sonographic Murphy sign noted.

Common bile duct:

Diameter: 2 mm. There is no intrahepatic, common hepatic, or common
bile duct dilatation.

Liver:

No focal lesion identified. Liver echogenicity is diffusely
increased.

IVC:

No abnormality visualized.

Pancreas:

No mass or inflammatory focus.

Spleen:

Size and appearance within normal limits.

Right Kidney:

Length: 11.1 cm. Echogenicity within normal limits. No mass or
hydronephrosis visualized.

Left Kidney:

Length: 11.0 cm. Echogenicity within normal limits. No mass or
hydronephrosis visualized.

Abdominal aorta:

No aneurysm visualized.

Other findings:

No demonstrable ascites.
IMPRESSION: There is again noted increased liver echogenicity consistent with
hepatic steatosis. While no focal liver lesions are identified, it
must be cautioned that the sensitivity of ultrasound for focal liver
lesions is diminished in this circumstance. Study otherwise
unremarkable.

## 2014-09-28 IMAGING — NM NM HEPATO W/GB/PHARM/[PERSON_NAME]
3 series · 13 of 13 positions shown · non-contrast
Comparison: Abdominal ultrasound 02/28/2014. CT Abdomen and Pelvis
11/10/2013.

CLINICAL DATA: 42-year-old female with epigastric abdominal pain.
Nausea vomiting. Initial encounter.

EXAM:
NUCLEAR MEDICINE HEPATOBILIARY IMAGING WITH GALLBLADDER EF
TECHNIQUE: Sequential images of the abdomen were obtained [DATE] minutes
following intravenous administration of radiopharmaceutical. After
slow intravenous infusion of 2 point to micrograms Cholecystokinin,
gallbladder ejection fraction was determined.
RADIOPHARMACEUTICALS:  5.0 Millicurie Xc-WWm Choletec

[he hepatobiliary · 1 of 1 slices shown (1 of 3)]
[im 1/1]
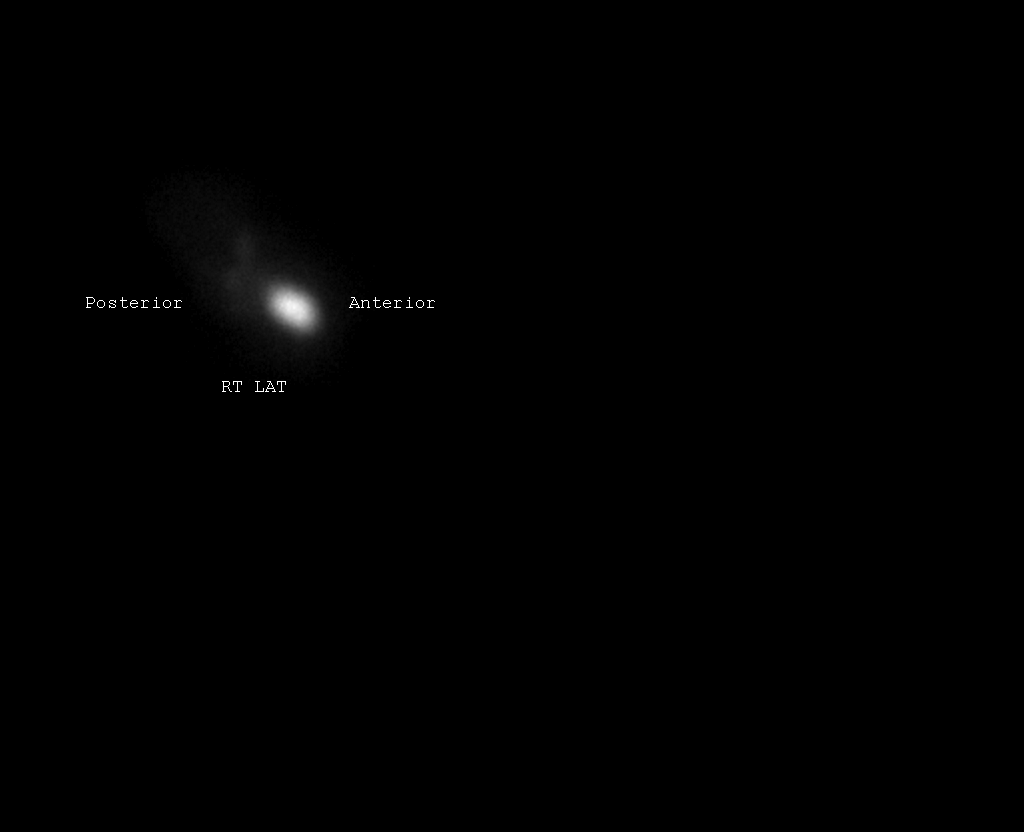

[he hepatobiliary · 3.43mm/px · 6 of 30 frames shown (2 of 3)]
[frame 3/30]
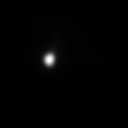
[frame 8/30]
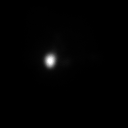
[frame 13/30]
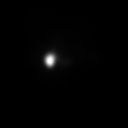
[frame 18/30]
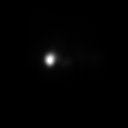
[frame 23/30]
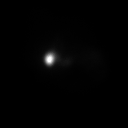
[frame 28/30]
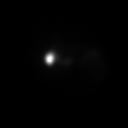

[he hepatobiliary · 3.43mm/px · 6 of 60 frames shown (3 of 3)]
[frame 6/60]
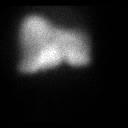
[frame 16/60]
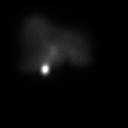
[frame 26/60]
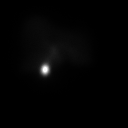
[frame 36/60]
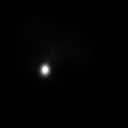
[frame 46/60]
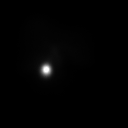
[frame 56/60]
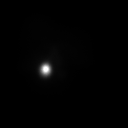

[13 of 13 positions shown; findings below may reference images not displayed]

FINDINGS: Good radiotracer uptake by the liver and clearance of the blood
pool.

Prompt gallbladder activity by 15 min. Small bowel activity
beginning at 60 min.

Gallbladder ejection fraction calculated to be 33% over 30 min. At
30 min, normal ejection fraction is greater than 30%.

The patient did experience abdominal pain symptoms during CCK
infusion.
IMPRESSION: Lower limits of normal gallbladder ejection fraction. But prompt
gallbladder radiotracer activity on the study arguing against
chronic cholecystitis. CCK administration did produce abdominal pain
symptoms.

## 2014-09-29 IMAGING — CR DG ABD PORTABLE 1V
1 series · 1 of 1 positions shown · non-contrast
Comparison: 11/10/2013

CLINICAL DATA: constipation

EXAM:
PORTABLE ABDOMEN - 1 VIEW

[AP]
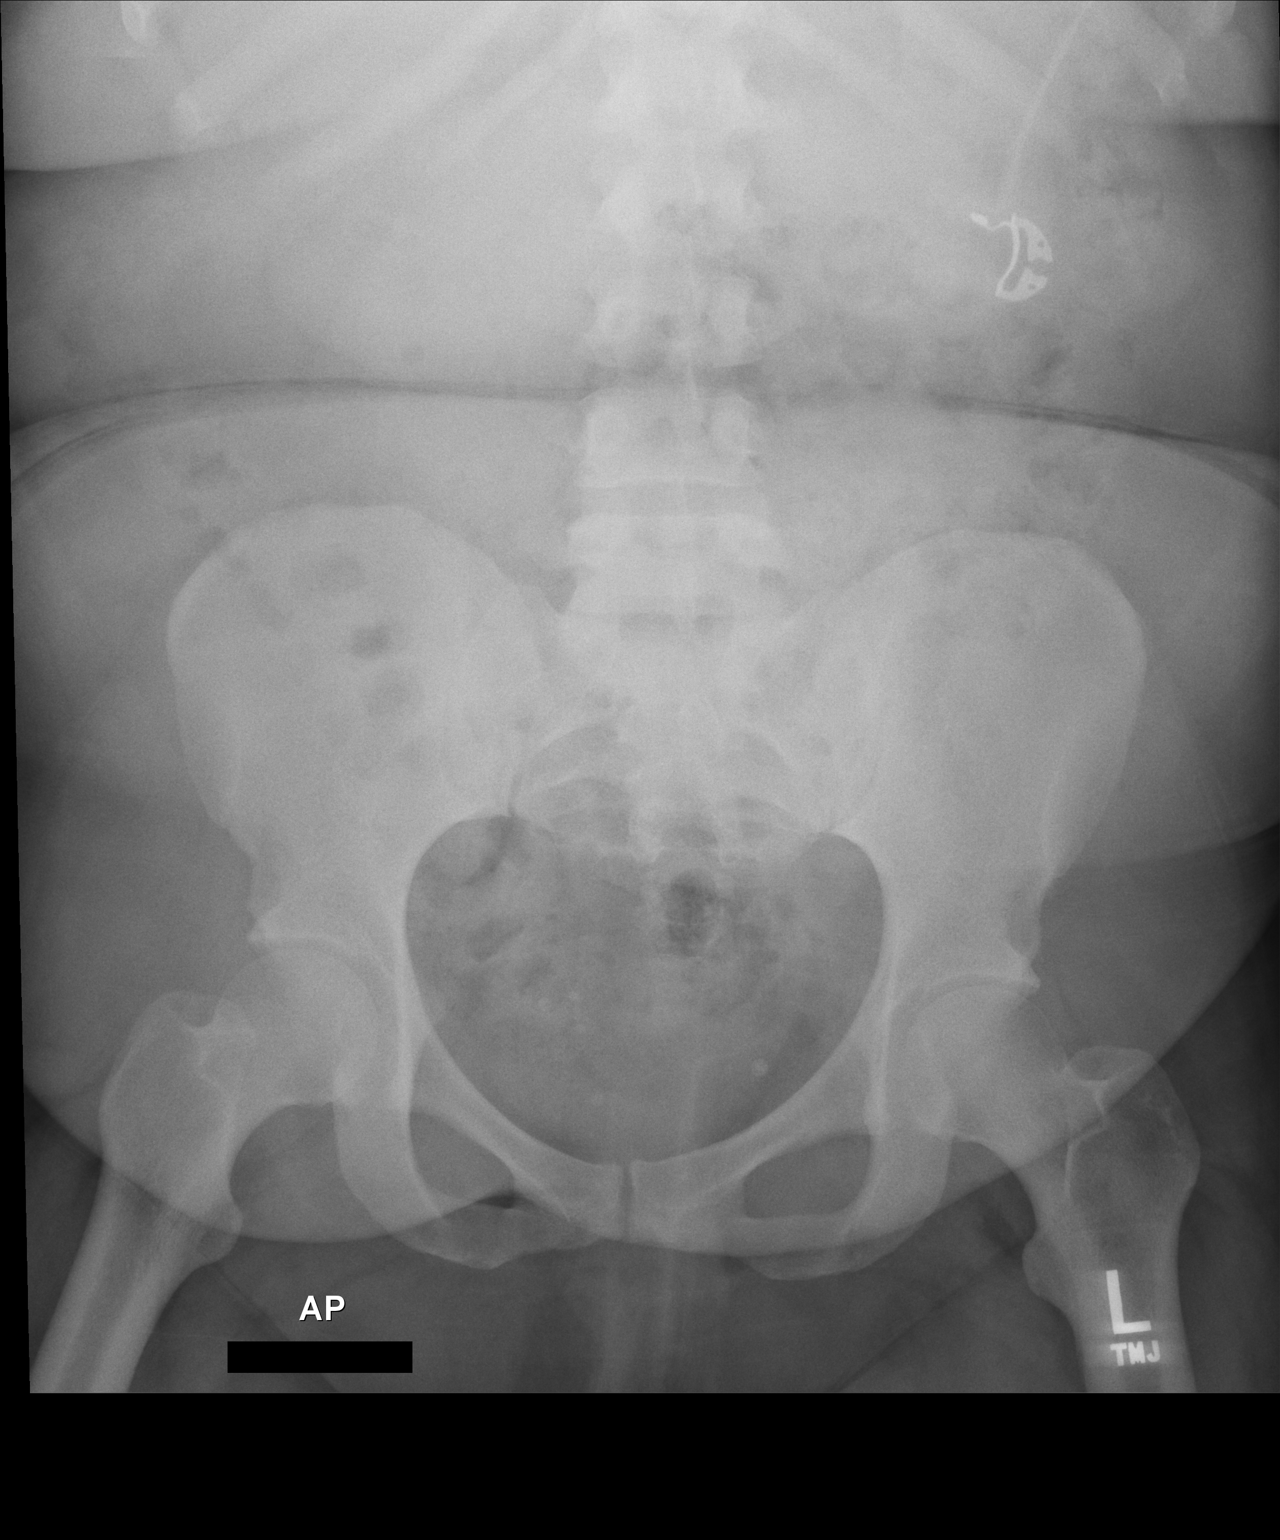

[1 of 1 positions shown; findings below may reference images not displayed]

FINDINGS: No disproportionate dilatation of bowel. Phleboliths project over
the pelvis. No obvious free intraperitoneal gas.
IMPRESSION: Nonobstructive bowel gas pattern.

## 2014-10-04 ENCOUNTER — Other Ambulatory Visit: Payer: Self-pay | Admitting: Orthopedic Surgery

## 2014-10-04 DIAGNOSIS — M25512 Pain in left shoulder: Secondary | ICD-10-CM

## 2014-10-04 DIAGNOSIS — M7582 Other shoulder lesions, left shoulder: Secondary | ICD-10-CM | POA: Diagnosis not present

## 2014-10-10 ENCOUNTER — Other Ambulatory Visit: Payer: Self-pay | Admitting: Internal Medicine

## 2014-10-14 ENCOUNTER — Other Ambulatory Visit: Payer: Self-pay

## 2014-10-27 ENCOUNTER — Inpatient Hospital Stay: Admission: RE | Admit: 2014-10-27 | Payer: Self-pay | Source: Ambulatory Visit

## 2014-11-09 ENCOUNTER — Ambulatory Visit
Admission: RE | Admit: 2014-11-09 | Discharge: 2014-11-09 | Disposition: A | Discharge status: Home / Self Care | Payer: PRIVATE HEALTH INSURANCE | Source: Ambulatory Visit | Attending: Orthopedic Surgery | Admitting: Orthopedic Surgery

## 2014-11-09 DIAGNOSIS — M25512 Pain in left shoulder: Secondary | ICD-10-CM

## 2014-11-18 ENCOUNTER — Encounter (HOSPITAL_COMMUNITY): Payer: Self-pay | Admitting: Emergency Medicine

## 2014-11-18 ENCOUNTER — Emergency Department (HOSPITAL_COMMUNITY)
Admission: EM | Admit: 2014-11-18 | Discharge: 2014-11-19 | Disposition: A | Discharge status: Home / Self Care | Payer: Self-pay | Attending: Emergency Medicine | Admitting: Emergency Medicine

## 2014-11-18 DIAGNOSIS — Z87891 Personal history of nicotine dependence: Secondary | ICD-10-CM | POA: Insufficient documentation

## 2014-11-18 DIAGNOSIS — G8929 Other chronic pain: Secondary | ICD-10-CM | POA: Insufficient documentation

## 2014-11-18 DIAGNOSIS — R112 Nausea with vomiting, unspecified: Secondary | ICD-10-CM | POA: Insufficient documentation

## 2014-11-18 DIAGNOSIS — K219 Gastro-esophageal reflux disease without esophagitis: Secondary | ICD-10-CM | POA: Insufficient documentation

## 2014-11-18 DIAGNOSIS — Z7982 Long term (current) use of aspirin: Secondary | ICD-10-CM | POA: Insufficient documentation

## 2014-11-18 DIAGNOSIS — E119 Type 2 diabetes mellitus without complications: Secondary | ICD-10-CM | POA: Insufficient documentation

## 2014-11-18 DIAGNOSIS — Z79899 Other long term (current) drug therapy: Secondary | ICD-10-CM | POA: Insufficient documentation

## 2014-11-18 DIAGNOSIS — I10 Essential (primary) hypertension: Secondary | ICD-10-CM | POA: Insufficient documentation

## 2014-11-18 DIAGNOSIS — Z7951 Long term (current) use of inhaled steroids: Secondary | ICD-10-CM | POA: Insufficient documentation

## 2014-11-18 DIAGNOSIS — F329 Major depressive disorder, single episode, unspecified: Secondary | ICD-10-CM | POA: Insufficient documentation

## 2014-11-18 DIAGNOSIS — Z9889 Other specified postprocedural states: Secondary | ICD-10-CM | POA: Insufficient documentation

## 2014-11-18 DIAGNOSIS — Z794 Long term (current) use of insulin: Secondary | ICD-10-CM | POA: Insufficient documentation

## 2014-11-18 DIAGNOSIS — J45909 Unspecified asthma, uncomplicated: Secondary | ICD-10-CM | POA: Insufficient documentation

## 2014-11-18 DIAGNOSIS — R000 Tachycardia, unspecified: Secondary | ICD-10-CM | POA: Insufficient documentation

## 2014-11-18 DIAGNOSIS — R197 Diarrhea, unspecified: Secondary | ICD-10-CM | POA: Insufficient documentation

## 2014-11-18 LAB — COMPREHENSIVE METABOLIC PANEL
ALBUMIN: 3.8 g/dL (ref 3.5–5.2)
ALT: 20 U/L (ref 0–35)
ANION GAP: 13 (ref 5–15)
AST: 18 U/L (ref 0–37)
Alkaline Phosphatase: 99 U/L (ref 39–117)
BUN: 13 mg/dL (ref 6–23)
CHLORIDE: 104 mmol/L (ref 96–112)
CO2: 22 mmol/L (ref 19–32)
Calcium: 9.4 mg/dL (ref 8.4–10.5)
Creatinine, Ser: 0.92 mg/dL (ref 0.50–1.10)
GFR calc Af Amer: 86 mL/min — ABNORMAL LOW (ref >90)
GFR, EST NON AFRICAN AMERICAN: 74 mL/min — AB (ref >90)
GLUCOSE: 171 mg/dL — AB (ref 70–99)
Potassium: 3.6 mmol/L (ref 3.5–5.1)
SODIUM: 139 mmol/L (ref 135–145)
Total Bilirubin: 0.3 mg/dL (ref 0.3–1.2)
Total Protein: 7.4 g/dL (ref 6.0–8.3)

## 2014-11-18 LAB — CBC WITH DIFFERENTIAL/PLATELET
Basophils Absolute: 0 10*3/uL (ref 0.0–0.1)
Basophils Relative: 0 % (ref 0–1)
Eosinophils Absolute: 0 10*3/uL (ref 0.0–0.7)
Eosinophils Relative: 0 % (ref 0–5)
HCT: 40 % (ref 36.0–46.0)
HEMOGLOBIN: 13.5 g/dL (ref 12.0–15.0)
Lymphocytes Relative: 33 % (ref 12–46)
Lymphs Abs: 3.2 10*3/uL (ref 0.7–4.0)
MCH: 28.5 pg (ref 26.0–34.0)
MCHC: 33.8 g/dL (ref 30.0–36.0)
MCV: 84.6 fL (ref 78.0–100.0)
Monocytes Absolute: 0.4 10*3/uL (ref 0.1–1.0)
Monocytes Relative: 4 % (ref 3–12)
NEUTROS PCT: 63 % (ref 43–77)
Neutro Abs: 6.1 10*3/uL (ref 1.7–7.7)
Platelets: 314 10*3/uL (ref 150–400)
RBC: 4.73 MIL/uL (ref 3.87–5.11)
RDW: 14.1 % (ref 11.5–15.5)
WBC: 9.7 10*3/uL (ref 4.0–10.5)

## 2014-11-18 LAB — URINALYSIS, ROUTINE W REFLEX MICROSCOPIC
Bilirubin Urine: NEGATIVE
GLUCOSE, UA: NEGATIVE mg/dL
Hgb urine dipstick: NEGATIVE
Ketones, ur: NEGATIVE mg/dL
Leukocytes, UA: NEGATIVE
Nitrite: NEGATIVE
PH: 5 (ref 5.0–8.0)
PROTEIN: NEGATIVE mg/dL
Specific Gravity, Urine: 1.017 (ref 1.005–1.030)
UROBILINOGEN UA: 0.2 mg/dL (ref 0.0–1.0)

## 2014-11-18 LAB — LIPASE, BLOOD: Lipase: 26 U/L (ref 11–59)

## 2014-11-18 MED ORDER — PROMETHAZINE HCL 25 MG/ML IJ SOLN
12.5000 mg | Freq: Once | INTRAMUSCULAR | Status: AC
  Administered 2014-11-18: 12.5 mg via INTRAVENOUS
  Filled 2014-11-18: qty 1

## 2014-11-18 MED ORDER — SODIUM CHLORIDE 0.9 % IV BOLUS (SEPSIS)
1000.0000 mL | Freq: Once | INTRAVENOUS | Status: AC
Start: 2014-11-18 — End: 2014-11-18
  Administered 2014-11-18: 1000 mL via INTRAVENOUS

## 2014-11-18 MED ORDER — MORPHINE SULFATE 4 MG/ML IJ SOLN
4.0000 mg | Freq: Once | INTRAMUSCULAR | Status: AC
  Administered 2014-11-18: 4 mg via INTRAVENOUS
  Filled 2014-11-18: qty 1

## 2014-11-18 MED ORDER — SODIUM CHLORIDE 0.9 % IV BOLUS (SEPSIS)
1000.0000 mL | Freq: Once | INTRAVENOUS | Status: AC
  Administered 2014-11-18: 1000 mL via INTRAVENOUS

## 2014-11-18 MED ORDER — ONDANSETRON 4 MG PO TBDP
8.0000 mg | ORAL_TABLET | Freq: Once | ORAL | Status: AC
  Administered 2014-11-18: 8 mg via ORAL

## 2014-11-18 MED ORDER — LOPERAMIDE HCL 2 MG PO CAPS
2.0000 mg | ORAL_CAPSULE | Freq: Once | ORAL | Status: AC
  Administered 2014-11-18: 2 mg via ORAL
  Filled 2014-11-18: qty 1

## 2014-11-18 MED ORDER — FENTANYL CITRATE 0.05 MG/ML IJ SOLN
50.0000 ug | Freq: Once | INTRAMUSCULAR | Status: AC
  Administered 2014-11-18: 50 ug via INTRAVENOUS

## 2014-11-18 MED ORDER — ONDANSETRON 4 MG PO TBDP
ORAL_TABLET | ORAL | Status: AC
  Filled 2014-11-18: qty 2

## 2014-11-18 MED ORDER — FENTANYL CITRATE 0.05 MG/ML IJ SOLN
INTRAMUSCULAR | Status: AC
  Filled 2014-11-18: qty 2

## 2014-11-18 NOTE — ED Notes (Signed)
Pt reports diarrhea and abdominal cramping x 1 week. sts she had fever of 101 yesterday. Pt also vomiting- and unable to keep any food/drink down.

## 2014-11-18 NOTE — ED Notes (Signed)
Patient given water to drink.  

## 2014-11-18 NOTE — ED Notes (Signed)
Patient given turkey sandwich and ginger ale.

## 2014-11-19 MED ORDER — LOPERAMIDE HCL 2 MG PO CAPS: 2.0000 mg | ORAL_CAPSULE | Freq: Four times a day (QID) | ORAL | Status: DC | PRN

## 2014-11-19 MED ORDER — PROMETHAZINE HCL 25 MG PO TABS: 25.0000 mg | ORAL_TABLET | Freq: Four times a day (QID) | ORAL | Status: DC | PRN

## 2014-11-19 NOTE — ED Provider Notes (Signed)
CSN: [id number]     Arrival date & time 11/18/14  1601 History   First MD Initiated Contact with Patient 11/18/14 1807     Chief Complaint  Patient presents with  . Diarrhea  . Abdominal Cramping     (Consider location/radiation/quality/duration/timing/severity/associated sxs/prior Treatment) Patient is a 44 y.o. female presenting with diarrhea and cramps. The history is provided by the patient.  Diarrhea Associated symptoms: abdominal pain and vomiting   Abdominal Cramping Associated symptoms include abdominal pain. Pertinent negatives include no chest pain.   patient states she's had nausea vomiting and diarrhea for the last 8 days. She states she is hungry but cannot eat. States it is just watery diarrhea. Vomiting stomach contents. She has had some fevers up to 101. She has diffuse crampy abdominal pain. She states a grandson had some similar symptoms but it was after she had started already. His only lasted a day. Her sugars have been pretty good.  Past Medical History  Diagnosis Date  . Hypertension   . Hypokalemia   . Asthma   . DM (diabetes mellitus)     INSULIN DEPENDENT  . Depression   . GERD (gastroesophageal reflux disease)   . Headache(784.0)   . Chronic back pain   . Chronic chest pain    Past Surgical History  Procedure Laterality Date  . Cesarean section      x 3  . Appendectomy    . Vesicovaginal fistula closure w/ tah  2009  . Hernia repair    . Cholecystectomy N/A 03/02/2014    Procedure: LAPAROSCOPIC CHOLECYSTECTOMY;  Surgeon: Joyice Faster. Cornett, MD;  Location: [facility];  Service: General;  Laterality: N/A;   Family History  Problem Relation Age of Onset  . Heart attack Mother   . Stroke Mother   . Diabetes Mother   . Hypertension Mother   . Arthritis Mother   . Stroke Father   . Hypertension Sister   . Diabetes Sister   . Seizures Brother   . Diabetes Brother    History  Substance Use Topics  . Smoking status: Former Smoker -- 0.30 packs/day for  22 years    Types: Cigarettes    Quit date: 05/01/2011  . Smokeless tobacco: Never Used  . Alcohol Use: 0.0 oz/week    0 Standard drinks or equivalent per week     Comment: rare   OB History    No data available     Review of Systems  Constitutional: Positive for appetite change. Negative for activity change.  HENT: Negative for congestion.   Respiratory: Negative for chest tightness.   Cardiovascular: Negative for chest pain.  Gastrointestinal: Positive for nausea, vomiting, abdominal pain and diarrhea.  Genitourinary: Negative for dysuria and vaginal pain.  Musculoskeletal: Negative for back pain.  Skin: Negative for wound.  Neurological: Positive for light-headedness.  Hematological: Negative for adenopathy.      Allergies  Reglan  Home Medications   Prior to Admission medications   Medication Sig Start Date End Date Taking? Authorizing Provider  albuterol (PROVENTIL HFA;VENTOLIN HFA) 108 (90 BASE) MCG/ACT inhaler Inhale 2 puffs into the lungs every 6 (six) hours as needed for wheezing or shortness of breath.   Yes Historical Provider, MD  albuterol (PROVENTIL) (2.5 MG/3ML) 0.083% nebulizer solution Take 2.5 mg by nebulization every 6 (six) hours as needed for wheezing or shortness of breath.   Yes Historical Provider, MD  amLODipine (NORVASC) 10 MG tablet Take 10 mg by mouth daily.   Yes  Historical Provider, MD  aspirin EC 325 MG tablet Take 325 mg by mouth daily.   Yes Historical Provider, MD  aspirin-acetaminophen-caffeine (EXCEDRIN MIGRAINE) [phone] MG per tablet Take 2 tablets by mouth every 6 (six) hours as needed for headache.   Yes Historical Provider, MD  benazepril (LOTENSIN) 20 MG tablet Take 20 mg by mouth daily.   Yes Historical Provider, MD  carvedilol (COREG) 3.125 MG tablet Take 3.125 mg by mouth 2 (two) times daily with a meal.   Yes Historical Provider, MD  cyclobenzaprine (FLEXERIL) 5 MG tablet Take 5 mg by mouth 3 (three) times daily as needed for  muscle spasms.   Yes Historical Provider, MD  Dapagliflozin Propanediol (FARXIGA) 5 MG TABS Take 5 mg by mouth daily.   Yes Historical Provider, MD  fluconazole (DIFLUCAN) 150 MG tablet Take 1 tablet (150 mg total) by mouth daily. 09/26/14  Yes Lance Bosch, NP  Fluticasone-Salmeterol (ADVAIR) 250-50 MCG/DOSE AEPB Inhale 1 puff into the lungs 2 times daily at 12 noon and 4 pm. 06/06/14  Yes Albin Felling, MD  gabapentin (NEURONTIN) 300 MG capsule Take 1 capsule (300 mg total) by mouth 3 (three) times daily. 06/14/14  Yes Lance Bosch, NP  hydrochlorothiazide (HYDRODIURIL) 25 MG tablet Take 25 mg by mouth daily.   Yes Historical Provider, MD  insulin glargine (LANTUS) 100 UNIT/ML injection Inject 0.18 mLs (18 Units total) into the skin at bedtime. 07/26/14  Yes Lance Bosch, NP  ipratropium (ATROVENT) 0.02 % nebulizer solution Take 0.5 mg by nebulization every 6 (six) hours as needed for wheezing or shortness of breath.    Yes Historical Provider, MD  metroNIDAZOLE (FLAGYL) 500 MG tablet Take 1 tablet (500 mg total) by mouth 2 (two) times daily. 09/06/14  Yes Lance Bosch, NP  oxyCODONE-acetaminophen (PERCOCET/ROXICET) 5-325 MG per tablet Take 1-2 tablets by mouth every 6 (six) hours as needed for severe pain. 08/06/14  Yes Carlisle Cater, PA-C  sertraline (ZOLOFT) 50 MG tablet Take 1 tablet (50 mg total) by mouth daily. 09/19/14  Yes Adam Melvern Sample, DO  simvastatin (ZOCOR) 10 MG tablet Take 10 mg by mouth daily at 6 PM.    Yes Historical Provider, MD  topiramate (TOPAMAX) 50 MG tablet Take 50 mg by mouth 2 (two) times daily.   Yes Historical Provider, MD  loperamide (IMODIUM) 2 MG capsule Take 1 capsule (2 mg total) by mouth 4 (four) times daily as needed for diarrhea or loose stools. 11/19/14   Jasper Riling. Artasia Thang, MD  methocarbamol (ROBAXIN) 500 MG tablet Take 1 tablet (500 mg total) by mouth 2 (two) times daily. Patient not taking: Reported on 11/19/2014 08/06/14   Carlisle Cater, PA-C   promethazine (PHENERGAN) 25 MG tablet Take 1 tablet (25 mg total) by mouth every 6 (six) hours as needed for nausea. 11/19/14   Jasper Riling. Irie Fiorello, MD  SUMAtriptan (IMITREX) 100 MG tablet Take 1tab at earliest onset of headache.  May repeat x1 in 2 hours if headache persists or recurs.  Do not exceed 2 tabs in 24hrs 09/19/14   Dudley Major, DO  TRUETEST TEST test strip  06/28/14   Historical Provider, MD   BP 151/104 mmHg  Pulse 89  Temp(Src) 99 F (37.2 C)  Resp 17  Ht 5\' 1"  (1.549 m)  Wt 235 lb (106.595 kg)  BMI 44.43 kg/m2  SpO2 100% Physical Exam  Constitutional: She appears well-developed and well-nourished.  Cardiovascular:  Tachycardia  Pulmonary/Chest: Effort normal.  Abdominal:  Soft. There is tenderness.  Diffuse tenderness without rebound or guarding.  Musculoskeletal: Normal range of motion.  Neurological: She is alert.  Skin: Skin is warm.    ED Course  Procedures (including critical care time) Labs Review Labs Reviewed  COMPREHENSIVE METABOLIC PANEL - Abnormal; Notable for the following:    Glucose, Bld 171 (*)    GFR calc non Af Amer 74 (*)    GFR calc Af Amer 86 (*)    All other components within normal limits  CBC WITH DIFFERENTIAL/PLATELET  URINALYSIS, ROUTINE W REFLEX MICROSCOPIC  LIPASE, BLOOD    Imaging Review No results found.   EKG Interpretation None      MDM   Final diagnoses:  Nausea vomiting and diarrhea    Patient with nausea vomiting diarrhea. No recent antibiotics or travel. Feels somewhat better after IV fluids. Has tolerated orals. Lab work overall reassuring. Will discharge home.    Jasper Riling. Alvino Chapel, MD 11/19/14 [phone]

## 2014-11-19 NOTE — Discharge Instructions (Signed)

## 2014-11-20 DIAGNOSIS — M7582 Other shoulder lesions, left shoulder: Secondary | ICD-10-CM | POA: Diagnosis not present

## 2014-12-01 ENCOUNTER — Ambulatory Visit: Payer: Self-pay | Admitting: Internal Medicine

## 2014-12-13 ENCOUNTER — Encounter: Payer: Self-pay | Admitting: Internal Medicine

## 2014-12-13 ENCOUNTER — Other Ambulatory Visit: Payer: Self-pay | Admitting: Internal Medicine

## 2014-12-13 ENCOUNTER — Ambulatory Visit: Payer: Self-pay | Attending: Internal Medicine | Admitting: Internal Medicine

## 2014-12-13 VITALS — BP 140/97 | HR 87 | Temp 98.1°F | Resp 16 | Ht 61.0 in | Wt 238.0 lb

## 2014-12-13 DIAGNOSIS — E785 Hyperlipidemia, unspecified: Secondary | ICD-10-CM | POA: Insufficient documentation

## 2014-12-13 DIAGNOSIS — J45901 Unspecified asthma with (acute) exacerbation: Secondary | ICD-10-CM | POA: Insufficient documentation

## 2014-12-13 DIAGNOSIS — J4531 Mild persistent asthma with (acute) exacerbation: Secondary | ICD-10-CM

## 2014-12-13 DIAGNOSIS — J069 Acute upper respiratory infection, unspecified: Secondary | ICD-10-CM | POA: Insufficient documentation

## 2014-12-13 DIAGNOSIS — E119 Type 2 diabetes mellitus without complications: Secondary | ICD-10-CM | POA: Insufficient documentation

## 2014-12-13 DIAGNOSIS — I10 Essential (primary) hypertension: Secondary | ICD-10-CM | POA: Insufficient documentation

## 2014-12-13 DIAGNOSIS — Z87891 Personal history of nicotine dependence: Secondary | ICD-10-CM | POA: Insufficient documentation

## 2014-12-13 LAB — POCT GLYCOSYLATED HEMOGLOBIN (HGB A1C): Hemoglobin A1C: 8.8

## 2014-12-13 LAB — GLUCOSE, POCT (MANUAL RESULT ENTRY): POC GLUCOSE: 206 mg/dL — AB (ref 70–99)

## 2014-12-13 MED ORDER — ALBUTEROL SULFATE (2.5 MG/3ML) 0.083% IN NEBU
2.5000 mg | INHALATION_SOLUTION | Freq: Once | RESPIRATORY_TRACT | Status: AC
  Administered 2014-12-13: 2.5 mg via RESPIRATORY_TRACT

## 2014-12-13 MED ORDER — PREDNISONE (PAK) 10 MG PO TABS: ORAL_TABLET | Freq: Every day | ORAL | Status: DC

## 2014-12-13 MED ORDER — AMOXICILLIN-POT CLAVULANATE 875-125 MG PO TABS: 1.0000 | ORAL_TABLET | Freq: Two times a day (BID) | ORAL | Status: DC

## 2014-12-13 NOTE — Progress Notes (Signed)
Pt is here following up on her diabetes and HTN. Pt states that she is coming down with a cold and her head hurts and her whole body has been aching for the past 7 days.

## 2014-12-13 NOTE — Progress Notes (Signed)
Patient ID: [name], female   DOB: 1971-02-09, 44 y.o.   MRN: [id number]   [name] is a 44 y.o. female with past medical history of HTN, T2DM, HLD, and asthma.  Patient reports that for the past 7 days she feels as if she has been coming down with a cold and has had generalized body aches for the past 7 days.  She has cough, sneezing, rhinitis, wheezing, fevers, sore throat, ear ache. She has tried [employer].   1. HTN: Medication: coreg, benazepril, HCTZ, amlodipine Home BP monitoring: does not check Positive ROS headaches Negative ROS: chest pain, palpitations, SOB Every visit patient has excuse as to why her BP is not controlled. Usually increased stress or not taking morning medication. She states that she has been unable to get refills because her husband has been out of work and she does not have any money.  2. DM2:  Medication: only takes insulin, does not have money to get pills filled Home CBG monitoring: does not remember, did not bring log Hypoglycemic event: none Positive ROS neuropathy Negative ROS: blurred vision, polyuria/dipsia  3. HLD: Medication: simvastatin, has not had due to lack of money Tolerance: well Positive ROS: none Negative [id number]  Social History reviewed: Smoker former Exercise none  [place] was seen today for follow-up.  Diagnoses and all orders for this visit:  Type 2 diabetes mellitus without complication Orders: -     Glucose (CBG) -     HgB A1c -     Microalbumin, urine Patients diabetes remains uncontrolled as evidence by hemoglobin a1c >8.  Patient has been non-compliant with medication regimen. Stressed the multiple complications associated with uncontrolled diabetes.  Patient will stay on current medication dose and report back to clinic with cbg log in 2 weeks.  Asthma with acute exacerbation, mild persistent Orders: -     albuterol (PROVENTIL) (2.5 MG/3ML) 0.083% nebulizer solution 2.5 mg; Take 3 mLs (2.5 mg total)  by nebulization once. -     predniSONE (STERAPRED UNI-PAK) 10 MG tablet; Take by mouth daily. 5-4-3-2-1. Take 5 pills today and decrease by one pill daily. Advised patient to avoid triggers  URI (upper respiratory infection) Orders: -     amoxicillin-clavulanate (AUGMENTIN) 875-125 MG per tablet; Take 1 tablet by mouth 2 (two) times daily.  May follow up if symptoms do not improve, otherwise 3 mo PCP DM/HTN   Chari Manning, NP 01/07/2015 9:49 PM

## 2014-12-14 LAB — MICROALBUMIN, URINE: Microalb, Ur: 1.2 mg/dL (ref <2.0)

## 2014-12-19 ENCOUNTER — Ambulatory Visit: Payer: Self-pay | Admitting: Neurology

## 2014-12-19 DIAGNOSIS — Z029 Encounter for administrative examinations, unspecified: Secondary | ICD-10-CM

## 2014-12-21 ENCOUNTER — Ambulatory Visit: Payer: Self-pay

## 2014-12-25 ENCOUNTER — Encounter: Payer: Self-pay | Admitting: Neurology

## 2015-01-02 IMAGING — CR DG CHEST 2V
1 series · 1 of 1 positions shown · non-contrast
Comparison: Chest radiograph 02/26/2014 and chest CT 11/10/2013

CLINICAL DATA: Shortness of breath.  History of asthma.

EXAM:
CHEST  2 VIEW

[view not recorded]
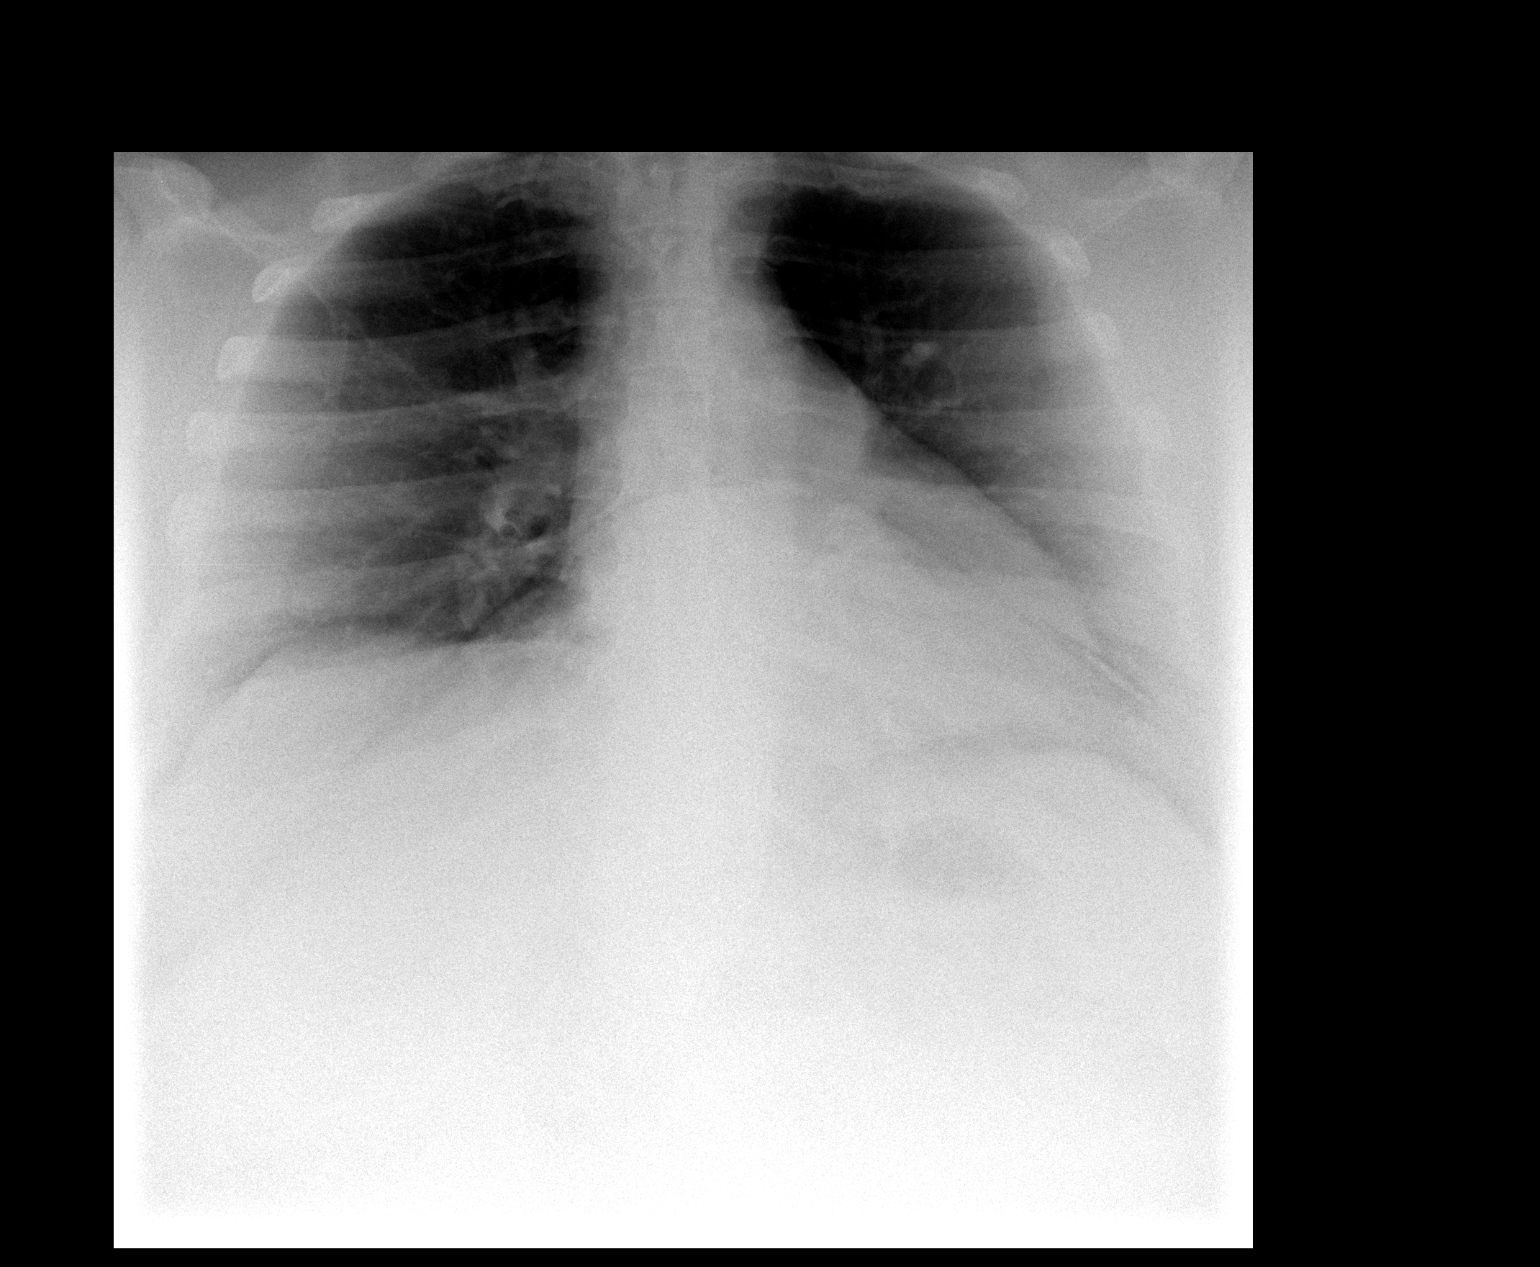

[1 of 1 positions shown; findings below may reference images not displayed]

FINDINGS: Large habitus. Portion of the abdominal pannus projects over the
lower chest. Cardiothymic silhouette appears stable. Heart size
appears borderline enlarged by chest radiograph. Pulmonary
vascularity is normal. Lung volumes are slightly low. Lungs appear
clear. Negative for pleural effusion or pneumothorax. Visualized
bony thorax is intact.
IMPRESSION: No acute cardiopulmonary disease identified.

## 2015-01-30 ENCOUNTER — Encounter (HOSPITAL_COMMUNITY): Payer: Self-pay | Admitting: Cardiology

## 2015-01-30 ENCOUNTER — Observation Stay (HOSPITAL_COMMUNITY)
Admission: EM | Admit: 2015-01-30 | Discharge: 2015-01-31 | Disposition: A | Discharge status: Home / Self Care | Payer: Self-pay | Attending: Family Medicine | Admitting: Family Medicine

## 2015-01-30 ENCOUNTER — Emergency Department (HOSPITAL_COMMUNITY): Payer: Medicaid Other

## 2015-01-30 DIAGNOSIS — R519 Headache, unspecified: Secondary | ICD-10-CM | POA: Diagnosis present

## 2015-01-30 DIAGNOSIS — G4733 Obstructive sleep apnea (adult) (pediatric): Secondary | ICD-10-CM | POA: Diagnosis present

## 2015-01-30 DIAGNOSIS — Z7982 Long term (current) use of aspirin: Secondary | ICD-10-CM | POA: Insufficient documentation

## 2015-01-30 DIAGNOSIS — G43909 Migraine, unspecified, not intractable, without status migrainosus: Principal | ICD-10-CM | POA: Diagnosis present

## 2015-01-30 DIAGNOSIS — E1165 Type 2 diabetes mellitus with hyperglycemia: Secondary | ICD-10-CM | POA: Insufficient documentation

## 2015-01-30 DIAGNOSIS — G43119 Migraine with aura, intractable, without status migrainosus: Secondary | ICD-10-CM | POA: Diagnosis not present

## 2015-01-30 DIAGNOSIS — Z794 Long term (current) use of insulin: Secondary | ICD-10-CM | POA: Insufficient documentation

## 2015-01-30 DIAGNOSIS — J45909 Unspecified asthma, uncomplicated: Secondary | ICD-10-CM | POA: Insufficient documentation

## 2015-01-30 DIAGNOSIS — R51 Headache: Secondary | ICD-10-CM

## 2015-01-30 DIAGNOSIS — I10 Essential (primary) hypertension: Secondary | ICD-10-CM | POA: Diagnosis present

## 2015-01-30 LAB — GLUCOSE, CAPILLARY: GLUCOSE-CAPILLARY: 204 mg/dL — AB (ref 70–99)

## 2015-01-30 LAB — CBG MONITORING, ED: GLUCOSE-CAPILLARY: 128 mg/dL — AB (ref 70–99)

## 2015-01-30 MED ORDER — INSULIN ASPART 100 UNIT/ML ~~LOC~~ SOLN: 0.0000 [IU] | Freq: Three times a day (TID) | SUBCUTANEOUS | Status: DC

## 2015-01-30 MED ORDER — VALPROATE SODIUM 500 MG/5ML IV SOLN
500.0000 mg | Freq: Once | INTRAVENOUS | Status: AC
  Administered 2015-01-30: 500 mg via INTRAVENOUS
  Filled 2015-01-30: qty 5

## 2015-01-30 MED ORDER — KETOROLAC TROMETHAMINE 15 MG/ML IJ SOLN
15.0000 mg | Freq: Once | INTRAMUSCULAR | Status: AC
  Administered 2015-01-30: 15 mg via INTRAVENOUS
  Filled 2015-01-30: qty 1

## 2015-01-30 MED ORDER — ALBUTEROL SULFATE (2.5 MG/3ML) 0.083% IN NEBU: 2.5000 mg | INHALATION_SOLUTION | Freq: Four times a day (QID) | RESPIRATORY_TRACT | Status: DC | PRN

## 2015-01-30 MED ORDER — SODIUM CHLORIDE 0.9 % IV SOLN: 250.0000 mL | INTRAVENOUS | Status: DC | PRN

## 2015-01-30 MED ORDER — SODIUM CHLORIDE 0.9 % IV SOLN
INTRAVENOUS | Status: DC
  Administered 2015-01-30: 20:00:00 via INTRAVENOUS

## 2015-01-30 MED ORDER — DIPHENHYDRAMINE HCL 50 MG/ML IJ SOLN
25.0000 mg | Freq: Once | INTRAMUSCULAR | Status: AC
  Administered 2015-01-30: 25 mg via INTRAVENOUS
  Filled 2015-01-30: qty 1

## 2015-01-30 MED ORDER — HYDROMORPHONE HCL 1 MG/ML IJ SOLN
1.0000 mg | Freq: Once | INTRAMUSCULAR | Status: AC
  Administered 2015-01-30: 1 mg via INTRAVENOUS
  Filled 2015-01-30: qty 1

## 2015-01-30 MED ORDER — ONDANSETRON HCL 4 MG PO TABS: 4.0000 mg | ORAL_TABLET | Freq: Four times a day (QID) | ORAL | Status: DC | PRN

## 2015-01-30 MED ORDER — VALPROATE SODIUM 500 MG/5ML IV SOLN
500.0000 mg | Freq: Once | INTRAVENOUS | Status: DC
  Filled 2015-01-30: qty 5

## 2015-01-30 MED ORDER — SODIUM CHLORIDE 0.9 % IJ SOLN: 3.0000 mL | INTRAMUSCULAR | Status: DC | PRN

## 2015-01-30 MED ORDER — IPRATROPIUM BROMIDE 0.02 % IN SOLN: 0.5000 mg | Freq: Four times a day (QID) | RESPIRATORY_TRACT | Status: DC | PRN

## 2015-01-30 MED ORDER — SODIUM CHLORIDE 0.9 % IV BOLUS (SEPSIS)
1000.0000 mL | Freq: Once | INTRAVENOUS | Status: AC
  Administered 2015-01-30: 1000 mL via INTRAVENOUS

## 2015-01-30 MED ORDER — HYDROCHLOROTHIAZIDE 25 MG PO TABS
25.0000 mg | ORAL_TABLET | Freq: Every day | ORAL | Status: DC
  Administered 2015-01-30: 25 mg via ORAL
  Filled 2015-01-30: qty 1

## 2015-01-30 MED ORDER — GABAPENTIN 300 MG PO CAPS
300.0000 mg | ORAL_CAPSULE | Freq: Three times a day (TID) | ORAL | Status: DC
  Administered 2015-01-30: 300 mg via ORAL
  Filled 2015-01-30: qty 1

## 2015-01-30 MED ORDER — SODIUM CHLORIDE 0.9 % IJ SOLN
3.0000 mL | Freq: Two times a day (BID) | INTRAMUSCULAR | Status: DC
Start: 2015-01-30 — End: 2015-01-31

## 2015-01-30 MED ORDER — PROCHLORPERAZINE EDISYLATE 5 MG/ML IJ SOLN
10.0000 mg | Freq: Four times a day (QID) | INTRAMUSCULAR | Status: DC | PRN
  Filled 2015-01-30: qty 2

## 2015-01-30 MED ORDER — SERTRALINE HCL 50 MG PO TABS
50.0000 mg | ORAL_TABLET | Freq: Every day | ORAL | Status: DC
  Administered 2015-01-30: 50 mg via ORAL
  Filled 2015-01-30: qty 1

## 2015-01-30 MED ORDER — PROCHLORPERAZINE EDISYLATE 5 MG/ML IJ SOLN
10.0000 mg | Freq: Three times a day (TID) | INTRAMUSCULAR | Status: DC
Start: 2015-01-30 — End: 2015-01-31
  Administered 2015-01-30: 10 mg via INTRAVENOUS
  Filled 2015-01-30 (×2): qty 2

## 2015-01-30 MED ORDER — ASPIRIN EC 325 MG PO TBEC
325.0000 mg | DELAYED_RELEASE_TABLET | Freq: Every day | ORAL | Status: DC
  Administered 2015-01-30: 325 mg via ORAL
  Filled 2015-01-30: qty 1

## 2015-01-30 MED ORDER — INSULIN GLARGINE 100 UNIT/ML ~~LOC~~ SOLN
18.0000 [IU] | Freq: Every day | SUBCUTANEOUS | Status: DC
  Administered 2015-01-30: 18 [IU] via SUBCUTANEOUS
  Filled 2015-01-30: qty 0.18

## 2015-01-30 MED ORDER — MAGNESIUM SULFATE 2 GM/50ML IV SOLN
2.0000 g | Freq: Once | INTRAVENOUS | Status: AC
  Administered 2015-01-30: 2 g via INTRAVENOUS
  Filled 2015-01-30: qty 50

## 2015-01-30 MED ORDER — ONDANSETRON HCL 4 MG/2ML IJ SOLN: 4.0000 mg | Freq: Four times a day (QID) | INTRAMUSCULAR | Status: DC | PRN

## 2015-01-30 MED ORDER — DIHYDROERGOTAMINE MESYLATE 1 MG/ML IJ SOLN
1.0000 mg | Freq: Three times a day (TID) | INTRAMUSCULAR | Status: DC
  Filled 2015-01-30 (×2): qty 1

## 2015-01-30 MED ORDER — DIHYDROERGOTAMINE MESYLATE 1 MG/ML IJ SOLN
0.5000 mg | Freq: Once | INTRAMUSCULAR | Status: AC
  Administered 2015-01-30: 0.5 mg via INTRAVENOUS
  Filled 2015-01-30: qty 0.5

## 2015-01-30 MED ORDER — ONDANSETRON HCL 4 MG/2ML IJ SOLN
4.0000 mg | Freq: Once | INTRAMUSCULAR | Status: AC
  Administered 2015-01-30: 4 mg via INTRAVENOUS
  Filled 2015-01-30: qty 2

## 2015-01-30 MED ORDER — AMLODIPINE BESYLATE 10 MG PO TABS: 10.0000 mg | ORAL_TABLET | Freq: Every day | ORAL | Status: DC

## 2015-01-30 NOTE — ED Provider Notes (Signed)
CSN: [id number]     Arrival date & time 01/30/15  1108 History   First MD Initiated Contact with Patient 01/30/15 1243     Chief Complaint  Patient presents with  . Headache  . Eye Pain     (Consider location/radiation/quality/duration/timing/severity/associated sxs/prior Treatment) HPI Comments: 44 year old female history of migraines comes in with migraine for 2 days. His tried over-the-counter medications as well as prescription medications without relief. Describes left-sided sharp stabbing headache. No radiation. 9 out 10. Gradual onset. Not thunderclap. Not worst of life  Patient is a 44 y.o. female presenting with headaches and eye pain.  Headache Location: L sided. Quality:  Sharp Radiates to:  Does not radiate Severity currently:  9/10 Onset quality:  Gradual Duration:  2 days Timing:  Constant Progression:  Worsening Chronicity:  New Context: bright light and loud noise   Context: not activity   Relieved by:  Nothing Ineffective treatments: home meds. Associated symptoms: eye pain   Associated symptoms: no abdominal pain, no back pain, no congestion, no cough and no fever   Eye Pain Associated symptoms include headaches. Pertinent negatives include no abdominal pain, chest pain, congestion, coughing, fever or rash.    Past Medical History  Diagnosis Date  . Hypertension   . Hypokalemia   . Asthma   . DM (diabetes mellitus)     INSULIN DEPENDENT  . Depression   . GERD (gastroesophageal reflux disease)   . Headache(784.0)   . Chronic back pain   . Chronic chest pain    Past Surgical History  Procedure Laterality Date  . Cesarean section      x 3  . Appendectomy    . Vesicovaginal fistula closure w/ tah  2009  . Hernia repair    . Cholecystectomy N/A 03/02/2014    Procedure: LAPAROSCOPIC CHOLECYSTECTOMY;  Surgeon: Joyice Faster. Cornett, MD;  Location: [facility];  Service: General;  Laterality: N/A;   Family History  Problem Relation Age of Onset  . Heart  attack Mother   . Stroke Mother   . Diabetes Mother   . Hypertension Mother   . Arthritis Mother   . Stroke Father   . Hypertension Sister   . Diabetes Sister   . Seizures Brother   . Diabetes Brother    History  Substance Use Topics  . Smoking status: Former Smoker -- 0.30 packs/day for 22 years    Types: Cigarettes    Quit date: 05/01/2011  . Smokeless tobacco: Never Used  . Alcohol Use: 0.0 oz/week    0 Standard drinks or equivalent per week     Comment: rare   OB History    No data available     Review of Systems  Constitutional: Negative for fever.  HENT: Negative for congestion.   Eyes: Positive for pain.  Respiratory: Negative for cough.   Cardiovascular: Negative for chest pain.  Gastrointestinal: Negative for abdominal pain.  Musculoskeletal: Negative for back pain.  Skin: Negative for rash.  Neurological: Positive for headaches.  Psychiatric/Behavioral: Negative for confusion.  All other systems reviewed and are negative.     Allergies  Reglan  Home Medications   Prior to Admission medications   Medication Sig Start Date End Date Taking? Authorizing Provider  albuterol (PROVENTIL HFA;VENTOLIN HFA) 108 (90 BASE) MCG/ACT inhaler Inhale 2 puffs into the lungs every 6 (six) hours as needed for wheezing or shortness of breath.   Yes Historical Provider, MD  albuterol (PROVENTIL) (2.5 MG/3ML) 0.083% nebulizer solution Take 2.5  mg by nebulization every 6 (six) hours as needed for wheezing or shortness of breath.   Yes Historical Provider, MD  amLODipine (NORVASC) 10 MG tablet Take 10 mg by mouth daily.   Yes Historical Provider, MD  aspirin EC 325 MG tablet Take 325 mg by mouth daily.   Yes Historical Provider, MD  aspirin-acetaminophen-caffeine (EXCEDRIN MIGRAINE) [phone] MG per tablet Take 2 tablets by mouth every 6 (six) hours as needed for headache.   Yes Historical Provider, MD  carvedilol (COREG) 3.125 MG tablet TAKE 1 TABLET BY MOUTH 2 TIMES DAILY  WITH A MEAL. 12/14/14  Yes Lance Bosch, NP  cyclobenzaprine (FLEXERIL) 5 MG tablet TAKE 1 TABLET BY MOUTH 3 TIMES DAILY AS NEEDED FOR MUSCLE SPASMS. 12/14/14  Yes Lance Bosch, NP  Dapagliflozin Propanediol (FARXIGA) 5 MG TABS Take 5 mg by mouth daily.   Yes Historical Provider, MD  Fluticasone-Salmeterol (ADVAIR) 250-50 MCG/DOSE AEPB Inhale 1 puff into the lungs 2 times daily at 12 noon and 4 pm. Patient taking differently: Inhale 1 puff into the lungs daily as needed (shortness of breath).  06/06/14  Yes Carly Montey Hora, MD  gabapentin (NEURONTIN) 300 MG capsule Take 1 capsule (300 mg total) by mouth 3 (three) times daily. 06/14/14  Yes Lance Bosch, NP  hydrochlorothiazide (HYDRODIURIL) 25 MG tablet Take 25 mg by mouth daily.   Yes Historical Provider, MD  insulin glargine (LANTUS) 100 UNIT/ML injection Inject 0.18 mLs (18 Units total) into the skin at bedtime. 07/26/14  Yes Lance Bosch, NP  ipratropium (ATROVENT) 0.02 % nebulizer solution Take 0.5 mg by nebulization every 6 (six) hours as needed for wheezing or shortness of breath.    Yes Historical Provider, MD  sertraline (ZOLOFT) 50 MG tablet Take 1 tablet (50 mg total) by mouth daily. 09/19/14  Yes Adam Telford Nab, DO  simvastatin (ZOCOR) 10 MG tablet Take 10 mg by mouth daily at 6 PM.    Yes Historical Provider, MD  SUMAtriptan (IMITREX) 100 MG tablet Take 1tab at earliest onset of headache.  May repeat x1 in 2 hours if headache persists or recurs.  Do not exceed 2 tabs in 24hrs 09/19/14  Yes Adam Telford Nab, DO  topiramate (TOPAMAX) 50 MG tablet Take 50 mg by mouth 2 (two) times daily.   Yes Historical Provider, MD  traMADol (ULTRAM) 50 MG tablet Take 50 mg by mouth every 6 (six) hours as needed for moderate pain or severe pain.   Yes Historical Provider, MD  TRUETEST TEST test strip  06/28/14  Yes Historical Provider, MD  amoxicillin-clavulanate (AUGMENTIN) 875-125 MG per tablet Take 1 tablet by mouth 2 (two) times daily. Patient not taking:  Reported on 01/30/2015 12/13/14   Lance Bosch, NP  fluconazole (DIFLUCAN) 150 MG tablet Take 1 tablet (150 mg total) by mouth daily. Patient not taking: Reported on 01/30/2015 09/26/14   Lance Bosch, NP  loperamide (IMODIUM) 2 MG capsule Take 1 capsule (2 mg total) by mouth 4 (four) times daily as needed for diarrhea or loose stools. Patient not taking: Reported on 01/30/2015 11/19/14   Davonna Belling, MD  methocarbamol (ROBAXIN) 500 MG tablet Take 1 tablet (500 mg total) by mouth 2 (two) times daily. Patient not taking: Reported on 01/30/2015 08/06/14   Carlisle Cater, PA-C  metroNIDAZOLE (FLAGYL) 500 MG tablet Take 1 tablet (500 mg total) by mouth 2 (two) times daily. Patient not taking: Reported on 12/13/2014 09/06/14   Lance Bosch, NP  oxyCODONE-acetaminophen (PERCOCET/ROXICET) 5-325 MG per tablet Take 1-2 tablets by mouth every 6 (six) hours as needed for severe pain. Patient not taking: Reported on 12/13/2014 08/06/14   Carlisle Cater, PA-C  predniSONE (STERAPRED UNI-PAK) 10 MG tablet Take by mouth daily. 5-4-3-2-1. Take 5 pills today and decrease by one pill daily. Patient not taking: Reported on 01/30/2015 12/13/14   Lance Bosch, NP  promethazine (PHENERGAN) 25 MG tablet Take 1 tablet (25 mg total) by mouth every 6 (six) hours as needed for nausea. Patient not taking: Reported on 12/13/2014 11/19/14   Davonna Belling, MD   BP 170/118 mmHg  Pulse 82  Temp(Src) 98.8 F (37.1 C) (Oral)  Resp 18  Ht 5\' 1"  (1.549 m)  Wt 235 lb (106.595 kg)  BMI 44.43 kg/m2  SpO2 100% Physical Exam  Constitutional: She is oriented to person, place, and time. She appears well-developed.  HENT:  Head: Normocephalic and atraumatic.  Eyes: Pupils are equal, round, and reactive to light.  Neck: Normal range of motion.  Cardiovascular: Normal rate and intact distal pulses.   Pulmonary/Chest: Effort normal. No respiratory distress.  Abdominal: Soft. She exhibits no distension. There is no tenderness.   Musculoskeletal: Normal range of motion.  Neurological: She is alert and oriented to person, place, and time. No cranial nerve deficit. She exhibits normal muscle tone. Coordination normal.  Skin: Skin is warm. No rash noted.  Psychiatric: She has a normal mood and affect.  Vitals reviewed.   ED Course  Procedures (including critical care time) Labs Review Labs Reviewed - No data to display  Imaging Review No results found.   EKG Interpretation None      MDM   44 year old female with signs and symptoms consistent with migraine headache. Patient with history of similar. This was not thunderclap or worst of life. She has a normal neurological exam without focal deficits. Remainder of exam is normal. Given reassuring exam initially treated with Compazine, Benadryl, Toradol and magnesium. Given patient's poorly controlled diabetes have withheld Decadron at this time. Patient states headache no better after medications were given. She was subsequently given Depakote Dilaudid and Zofran. On reexamination continued to have a normal neurological exam however states pain was still a 9 out of 10. Patient had taken her home Imitrex at home prior to arrival. Given intractable migraine consult to neurology. They will see patient. Possible admission for DHE.  Final diagnoses:  None        Robynn Pane, MD 01/30/15 1537  Sherwood Gambler, MD 02/04/15 [phone]

## 2015-01-30 NOTE — ED Provider Notes (Signed)
   Physical Exam  BP 163/110 mmHg  Pulse 69  Temp(Src) 98.8 F (37.1 C) (Oral)  Resp 20  Ht 5\' 1"  (1.549 m)  Wt 235 lb (106.595 kg)  BMI 44.43 kg/m2  SpO2 98%  Physical Exam  ED Course  Procedures  MDM 4:19 PM Pt's care assumed from Dr. Olene Floss.   At time of sign out, pt is awaiting neuro recommendations for further headache management.   The neurology team evaluate the patient and gave the patient a dose of DHE while in the emergency department. As this did not resolve her headache enough to discharge the patient, they're recommending admission to the hospitalist service for a DHE protocol admission.  The patient had no other complications or problems while remaining in the emergency department and was admitted in stable condition.   This patient was seen with Dr. Vanita Panda, emergency medicine attending.  Antony Blackbird, MD 01/31/15 5248  Carmin Muskrat, MD 01/31/15 2351

## 2015-01-30 NOTE — Progress Notes (Signed)
Report called to ED  

## 2015-01-30 NOTE — Progress Notes (Signed)
Pt adm to 5W rm 39 via stretcher from ED, a/o x 4, LCTA, skin dry, intact, abd sounds present in all quad. Pt states  h/a is decreasing, level 4/10. Plan of care initiated. Pt informed of falls, to call for assist prn, verbalized understanding.

## 2015-01-30 NOTE — ED Notes (Signed)
Neuro at bedside.

## 2015-01-30 NOTE — ED Notes (Signed)
Patient transported to CT 

## 2015-01-30 NOTE — ED Notes (Signed)
CBG 128 

## 2015-01-30 NOTE — Consult Note (Signed)
NEURO HOSPITALIST CONSULT NOTE    Reason for Consult: HA  HPI:                                                                                                                                          [name] is an 44 y.o. female with known Migraine HA who was last seen by Dr. Tomi Likens.  She states she has a HA about 3 times a month but has never been laced on a prophylactic medication. This Saturday she noted she was getting a HA and over the course of 4 days it has progressively worsened.  She rates her current HA as a 9/10 but states she has had worse. Positive phonophobia/photophobia/nausea/emesis.  HA is throbbing in nature and behind her left eye. She states she has blurred vision in her left eye but feels this is due to the light sensitivity. Currently has received Magnesium, Benadryl, Dilaudid, Toradol, Zofran and Depakote with no relief.   Past Medical History  Diagnosis Date  . Hypertension   . Hypokalemia   . Asthma   . DM (diabetes mellitus)     INSULIN DEPENDENT  . Depression   . GERD (gastroesophageal reflux disease)   . Headache(784.0)   . Chronic back pain   . Chronic chest pain     Past Surgical History  Procedure Laterality Date  . Cesarean section      x 3  . Appendectomy    . Vesicovaginal fistula closure w/ tah  2009  . Hernia repair    . Cholecystectomy N/A 03/02/2014    Procedure: LAPAROSCOPIC CHOLECYSTECTOMY;  Surgeon: Joyice Faster. Cornett, MD;  Location: [facility];  Service: General;  Laterality: N/A;    Family History  Problem Relation Age of Onset  . Heart attack Mother   . Stroke Mother   . Diabetes Mother   . Hypertension Mother   . Arthritis Mother   . Stroke Father   . Hypertension Sister   . Diabetes Sister   . Seizures Brother   . Diabetes Brother      Social History:  reports that she quit smoking about 3 years ago. Her smoking use included Cigarettes. She has a 6.6 pack-year smoking history. She has never used  smokeless tobacco. She reports that she drinks alcohol. She reports that she does not use illicit drugs.  Allergies  Allergen Reactions  . Reglan [Metoclopramide] Other (See Comments)    Panic attack    MEDICATIONS:  Current Facility-Administered Medications  Medication Dose Route Frequency Provider Last Rate Last Dose  . prochlorperazine (COMPAZINE) injection 10 mg  10 mg Intravenous Q6H PRN Robynn Pane, MD       Current Outpatient Prescriptions  Medication Sig Dispense Refill  . albuterol (PROVENTIL HFA;VENTOLIN HFA) 108 (90 BASE) MCG/ACT inhaler Inhale 2 puffs into the lungs every 6 (six) hours as needed for wheezing or shortness of breath.    Marland Kitchen albuterol (PROVENTIL) (2.5 MG/3ML) 0.083% nebulizer solution Take 2.5 mg by nebulization every 6 (six) hours as needed for wheezing or shortness of breath.    Marland Kitchen amLODipine (NORVASC) 10 MG tablet Take 10 mg by mouth daily.    Marland Kitchen aspirin EC 325 MG tablet Take 325 mg by mouth daily.    Marland Kitchen aspirin-acetaminophen-caffeine (EXCEDRIN MIGRAINE) 250-250-65 MG per tablet Take 2 tablets by mouth every 6 (six) hours as needed for headache.    . carvedilol (COREG) 3.125 MG tablet TAKE 1 TABLET BY MOUTH 2 TIMES DAILY WITH A MEAL. 60 tablet 5  . cyclobenzaprine (FLEXERIL) 5 MG tablet TAKE 1 TABLET BY MOUTH 3 TIMES DAILY AS NEEDED FOR MUSCLE SPASMS. 90 tablet 2  . Dapagliflozin Propanediol (FARXIGA) 5 MG TABS Take 5 mg by mouth daily.    . Fluticasone-Salmeterol (ADVAIR) 250-50 MCG/DOSE AEPB Inhale 1 puff into the lungs 2 times daily at 12 noon and 4 pm. (Patient taking differently: Inhale 1 puff into the lungs daily as needed (shortness of breath). ) 60 each 0  . gabapentin (NEURONTIN) 300 MG capsule Take 1 capsule (300 mg total) by mouth 3 (three) times daily. 90 capsule 3  . hydrochlorothiazide (HYDRODIURIL) 25 MG tablet Take 25 mg by mouth daily.     . insulin glargine (LANTUS) 100 UNIT/ML injection Inject 0.18 mLs (18 Units total) into the skin at bedtime. 10 mL 11  . ipratropium (ATROVENT) 0.02 % nebulizer solution Take 0.5 mg by nebulization every 6 (six) hours as needed for wheezing or shortness of breath.     . sertraline (ZOLOFT) 50 MG tablet Take 1 tablet (50 mg total) by mouth daily. 30 tablet 2  . simvastatin (ZOCOR) 10 MG tablet Take 10 mg by mouth daily at 6 PM.     . SUMAtriptan (IMITREX) 100 MG tablet Take 1tab at earliest onset of headache.  May repeat x1 in 2 hours if headache persists or recurs.  Do not exceed 2 tabs in 24hrs 10 tablet 2  . topiramate (TOPAMAX) 50 MG tablet Take 50 mg by mouth 2 (two) times daily.    . traMADol (ULTRAM) 50 MG tablet Take 50 mg by mouth every 6 (six) hours as needed for moderate pain or severe pain.    . TRUETEST TEST test strip   12  . amoxicillin-clavulanate (AUGMENTIN) 875-125 MG per tablet Take 1 tablet by mouth 2 (two) times daily. (Patient not taking: Reported on 01/30/2015) 14 tablet 0  . fluconazole (DIFLUCAN) 150 MG tablet Take 1 tablet (150 mg total) by mouth daily. (Patient not taking: Reported on 01/30/2015) 1 tablet 0  . loperamide (IMODIUM) 2 MG capsule Take 1 capsule (2 mg total) by mouth 4 (four) times daily as needed for diarrhea or loose stools. (Patient not taking: Reported on 01/30/2015) 12 capsule 0  . methocarbamol (ROBAXIN) 500 MG tablet Take 1 tablet (500 mg total) by mouth 2 (two) times daily. (Patient not taking: Reported on 01/30/2015) 20 tablet 0  . metroNIDAZOLE (FLAGYL) 500 MG tablet Take 1 tablet (500 mg total)  by mouth 2 (two) times daily. (Patient not taking: Reported on 12/13/2014) 14 tablet 0  . oxyCODONE-acetaminophen (PERCOCET/ROXICET) 5-325 MG per tablet Take 1-2 tablets by mouth every 6 (six) hours as needed for severe pain. (Patient not taking: Reported on 12/13/2014) 12 tablet 0  . predniSONE (STERAPRED UNI-PAK) 10 MG tablet Take by mouth daily. 5-4-3-2-1. Take 5  pills today and decrease by one pill daily. (Patient not taking: Reported on 01/30/2015) 20 tablet 0  . promethazine (PHENERGAN) 25 MG tablet Take 1 tablet (25 mg total) by mouth every 6 (six) hours as needed for nausea. (Patient not taking: Reported on 12/13/2014) 20 tablet 0      ROS:                                                                                                                                       History obtained from the patient  General ROS: negative for - chills, fatigue, fever, night sweats, weight gain or weight loss Psychological ROS: negative for - behavioral disorder, hallucinations, memory difficulties, mood swings or suicidal ideation Ophthalmic ROS: positive for - blurry vision ENT ROS: negative for - epistaxis, nasal discharge, oral lesions, sore throat, tinnitus or vertigo Allergy and Immunology ROS: negative for - hives or itchy/watery eyes Hematological and Lymphatic ROS: negative for - bleeding problems, bruising or swollen lymph nodes Endocrine ROS: negative for - galactorrhea, hair pattern changes, polydipsia/polyuria or temperature intolerance Respiratory ROS: negative for - cough, hemoptysis, shortness of breath or wheezing Cardiovascular ROS: negative for - chest pain, dyspnea on exertion, edema or irregular heartbeat Gastrointestinal ROS: negative for - abdominal pain, diarrhea, hematemesis, nausea/vomiting or stool incontinence Genito-Urinary ROS: negative for - dysuria, hematuria, incontinence or urinary frequency/urgency Musculoskeletal ROS: negative for - joint swelling or muscular weakness Neurological ROS: as noted in HPI Dermatological ROS: negative for rash and skin lesion changes   Blood pressure 163/110, pulse 69, temperature 98.8 F (37.1 C), temperature source Oral, resp. rate 20, height 5\' 1"  (1.549 m), weight 106.595 kg (235 lb), SpO2 98 %.   Neurologic Examination:                                                                                                       HEENT-  Normocephalic, no lesions, without obvious abnormality.  Normal external eye and conjunctiva.  Normal TM's bilaterally.  Normal auditory canals and external ears. Normal external nose, mucus membranes and septum.  Normal pharynx. Cardiovascular- S1, S2 normal, pulses palpable  throughout   Lungs- chest clear, no wheezing, rales, normal symmetric air entry Abdomen- normal findings: bowel sounds normal Extremities- no edema Lymph-no adenopathy palpable Musculoskeletal-no joint tenderness, deformity or swelling Skin-warm and dry, no hyperpigmentation, vitiligo, or suspicious lesions  Neurological Examination Mental Status: Alert, oriented, thought content appropriate.  Speech fluent without evidence of aphasia.  Able to follow 3 step commands without difficulty. Cranial Nerves: II: Discs flat bilaterally; Visual fields grossly normal, pupils equal, round, reactive to light and accommodation III,IV, VI: ptosis not present, extra-ocular motions intact bilaterally V,VII: smile symmetric, facial light touch sensation normal bilaterally VIII: hearing normal bilaterally IX,X: uvula rises symmetrically XI: bilateral shoulder shrug XII: midline tongue extension Motor: Right : Upper extremity   5/5    Left:     Upper extremity   5/5  Lower extremity   5/5     Lower extremity   5/5 Tone and bulk:normal tone throughout; no atrophy noted Sensory: Pinprick and light touch intact throughout, bilaterally Deep Tendon Reflexes: 2+ and symmetric throughout UE and 1+ in bilateral KJ with no AJ Plantars: Right: downgoing   Left: downgoing Cerebellar: normal finger-to-nose, and normal heel-to-shin test Gait: normal gait and station      Lab Results: Basic Metabolic Panel: No results for input(s): NA, K, CL, CO2, GLUCOSE, BUN, CREATININE, CALCIUM, MG, PHOS in the last 168 hours.  Liver Function Tests: No results for input(s): AST, ALT, ALKPHOS, BILITOT, PROT,  ALBUMIN in the last 168 hours. No results for input(s): LIPASE, AMYLASE in the last 168 hours. No results for input(s): AMMONIA in the last 168 hours.  CBC: No results for input(s): WBC, NEUTROABS, HGB, HCT, MCV, PLT in the last 168 hours.  Cardiac Enzymes: No results for input(s): CKTOTAL, CKMB, CKMBINDEX, TROPONINI in the last 168 hours.  Lipid Panel: No results for input(s): CHOL, TRIG, HDL, CHOLHDL, VLDL, LDLCALC in the last 168 hours.  CBG:  Recent Labs Lab 01/30/15 1340  GLUCAP 128*     Coagulation Studies: No results for input(s): LABPROT, INR in the last 72 hours.  Imaging: No results found.     Assessment and plan per attending neurologist  Etta Quill PA-C [facility] [phone]  01/30/2015, 3:52 PM   Assessment/Plan:  44 yo F with intractable migraine. She has had multiple rounds of medications with no improvement. The last thing to offer acutely I think would be DHE protocol.   1) DHE 0.5mg  IV x 1 2) If continues to have headache, would repeat dosing at a dose of 1mg  x 2 with 8 hour interval(e.g. 1mg  8 hours following initial dose) with compazine premedication.  3) Neurology will follow.   Roland Rack, MD [facility] [phone]  If 7pm- 7am, please page neurology on call as listed in [place].

## 2015-01-30 NOTE — ED Notes (Signed)
Pt using bedside commode-- states h/a "a little better"

## 2015-01-30 NOTE — H&P (Signed)
PCP:   Chari Manning, NP   Chief Complaint:  Migraine for 2 days  HPI: 44 yo female h/o dm, htn, chronic migraines comes in with 2 days of migraine with photophobia, n/v, tingling in face typical for her usual migraines but this one is worse.  Not relieved with home imitrex and topomax.  No fevers.  Has received a dose of DHE in ED and migraine still 8/10.  Neurology recommended obs for further DHE doses.  She is not on prophylactic medication and has 3-4 migraines a month.  Review of Systems:  Positive and negative as per HPI otherwise all other systems are negative  Past Medical History: Past Medical History  Diagnosis Date  . Hypertension   . Hypokalemia   . Asthma   . DM (diabetes mellitus)     INSULIN DEPENDENT  . Depression   . GERD (gastroesophageal reflux disease)   . Headache(784.0)   . Chronic back pain   . Chronic chest pain    Past Surgical History  Procedure Laterality Date  . Cesarean section      x 3  . Appendectomy    . Vesicovaginal fistula closure w/ tah  2009  . Hernia repair    . Cholecystectomy N/A 03/02/2014    Procedure: LAPAROSCOPIC CHOLECYSTECTOMY;  Surgeon: Joyice Faster. Cornett, MD;  Location: [place];  Service: General;  Laterality: N/A;    Medications: Prior to Admission medications   Medication Sig Start Date End Date Taking? Authorizing Provider  albuterol (PROVENTIL HFA;VENTOLIN HFA) 108 (90 BASE) MCG/ACT inhaler Inhale 2 puffs into the lungs every 6 (six) hours as needed for wheezing or shortness of breath.   Yes Historical Provider, MD  albuterol (PROVENTIL) (2.5 MG/3ML) 0.083% nebulizer solution Take 2.5 mg by nebulization every 6 (six) hours as needed for wheezing or shortness of breath.   Yes Historical Provider, MD  amLODipine (NORVASC) 10 MG tablet Take 10 mg by mouth daily.   Yes Historical Provider, MD  aspirin EC 325 MG tablet Take 325 mg by mouth daily.   Yes Historical Provider, MD  aspirin-acetaminophen-caffeine (EXCEDRIN MIGRAINE)  [phone] MG per tablet Take 2 tablets by mouth every 6 (six) hours as needed for headache.   Yes Historical Provider, MD  carvedilol (COREG) 3.125 MG tablet TAKE 1 TABLET BY MOUTH 2 TIMES DAILY WITH A MEAL. 12/14/14  Yes Lance Bosch, NP  cyclobenzaprine (FLEXERIL) 5 MG tablet TAKE 1 TABLET BY MOUTH 3 TIMES DAILY AS NEEDED FOR MUSCLE SPASMS. 12/14/14  Yes Lance Bosch, NP  Dapagliflozin Propanediol (FARXIGA) 5 MG TABS Take 5 mg by mouth daily.   Yes Historical Provider, MD  Fluticasone-Salmeterol (ADVAIR) 250-50 MCG/DOSE AEPB Inhale 1 puff into the lungs 2 times daily at 12 noon and 4 pm. Patient taking differently: Inhale 1 puff into the lungs daily as needed (shortness of breath).  06/06/14  Yes Carly Montey Hora, MD  gabapentin (NEURONTIN) 300 MG capsule Take 1 capsule (300 mg total) by mouth 3 (three) times daily. 06/14/14  Yes Lance Bosch, NP  hydrochlorothiazide (HYDRODIURIL) 25 MG tablet Take 25 mg by mouth daily.   Yes Historical Provider, MD  insulin glargine (LANTUS) 100 UNIT/ML injection Inject 0.18 mLs (18 Units total) into the skin at bedtime. 07/26/14  Yes Lance Bosch, NP  ipratropium (ATROVENT) 0.02 % nebulizer solution Take 0.5 mg by nebulization every 6 (six) hours as needed for wheezing or shortness of breath.    Yes Historical Provider, MD  sertraline (ZOLOFT)  50 MG tablet Take 1 tablet (50 mg total) by mouth daily. 09/19/14  Yes Adam Telford Nab, DO  simvastatin (ZOCOR) 10 MG tablet Take 10 mg by mouth daily at 6 PM.    Yes Historical Provider, MD  SUMAtriptan (IMITREX) 100 MG tablet Take 1tab at earliest onset of headache.  May repeat x1 in 2 hours if headache persists or recurs.  Do not exceed 2 tabs in 24hrs 09/19/14  Yes Adam Telford Nab, DO  topiramate (TOPAMAX) 50 MG tablet Take 50 mg by mouth 2 (two) times daily.   Yes Historical Provider, MD  traMADol (ULTRAM) 50 MG tablet Take 50 mg by mouth every 6 (six) hours as needed for moderate pain or severe pain.   Yes Historical  Provider, MD  TRUETEST TEST test strip  06/28/14  Yes Historical Provider, MD  amoxicillin-clavulanate (AUGMENTIN) 875-125 MG per tablet Take 1 tablet by mouth 2 (two) times daily. Patient not taking: Reported on 01/30/2015 12/13/14   Lance Bosch, NP  fluconazole (DIFLUCAN) 150 MG tablet Take 1 tablet (150 mg total) by mouth daily. Patient not taking: Reported on 01/30/2015 09/26/14   Lance Bosch, NP  loperamide (IMODIUM) 2 MG capsule Take 1 capsule (2 mg total) by mouth 4 (four) times daily as needed for diarrhea or loose stools. Patient not taking: Reported on 01/30/2015 11/19/14   Davonna Belling, MD  methocarbamol (ROBAXIN) 500 MG tablet Take 1 tablet (500 mg total) by mouth 2 (two) times daily. Patient not taking: Reported on 01/30/2015 08/06/14   Carlisle Cater, PA-C  metroNIDAZOLE (FLAGYL) 500 MG tablet Take 1 tablet (500 mg total) by mouth 2 (two) times daily. Patient not taking: Reported on 12/13/2014 09/06/14   Lance Bosch, NP  oxyCODONE-acetaminophen (PERCOCET/ROXICET) 5-325 MG per tablet Take 1-2 tablets by mouth every 6 (six) hours as needed for severe pain. Patient not taking: Reported on 12/13/2014 08/06/14   Carlisle Cater, PA-C  predniSONE (STERAPRED UNI-PAK) 10 MG tablet Take by mouth daily. 5-4-3-2-1. Take 5 pills today and decrease by one pill daily. Patient not taking: Reported on 01/30/2015 12/13/14   Lance Bosch, NP  promethazine (PHENERGAN) 25 MG tablet Take 1 tablet (25 mg total) by mouth every 6 (six) hours as needed for nausea. Patient not taking: Reported on 12/13/2014 11/19/14   Davonna Belling, MD    Allergies:   Allergies  Allergen Reactions  . Reglan [Metoclopramide] Other (See Comments)    Panic attack    Social History:  reports that she quit smoking about 3 years ago. Her smoking use included Cigarettes. She has a 6.6 pack-year smoking history. She has never used smokeless tobacco. She reports that she drinks alcohol. She reports that she does not use  illicit drugs.  Family History: Family History  Problem Relation Age of Onset  . Heart attack Mother   . Stroke Mother   . Diabetes Mother   . Hypertension Mother   . Arthritis Mother   . Stroke Father   . Hypertension Sister   . Diabetes Sister   . Seizures Brother   . Diabetes Brother     Physical Exam: Filed Vitals:   01/30/15 1702 01/30/15 1715 01/30/15 1730 01/30/15 1745  BP: 152/101 142/106 172/99 166/103  Pulse: 74 68 75 80  Temp:      TempSrc:      Resp:      Height:      Weight:      SpO2: 99% 97% 97% 99%  General appearance: alert, cooperative and no distress Head: Normocephalic, without obvious abnormality, atraumatic Eyes: negative Nose: Nares normal. Septum midline. Mucosa normal. No drainage or sinus tenderness. Neck: no JVD and supple, symmetrical, trachea midline Lungs: clear to auscultation bilaterally Heart: regular rate and rhythm, S1, S2 normal, no murmur, click, rub or gallop Abdomen: soft, non-tender; bowel sounds normal; no masses,  no organomegaly Extremities: extremities normal, atraumatic, no cyanosis or edema Pulses: 2+ and symmetric Skin: Skin color, texture, turgor normal. No rashes or lesions Neurologic: Grossly normal photophobia    Labs on Admission:        Radiological Exams on Admission: Ct Head Wo Contrast  01/30/2015   CLINICAL DATA:  Migraine headache.  EXAM: CT HEAD WITHOUT CONTRAST  TECHNIQUE: Contiguous axial images were obtained from the base of the skull through the vertex without intravenous contrast.  COMPARISON:  CT scan of September 24, 2012.  FINDINGS: Bony calvarium appears intact. No mass effect or midline shift is noted. Ventricular size is within normal limits. There is no evidence of mass lesion, hemorrhage or acute infarction.  IMPRESSION: Normal head CT.   Electronically Signed   By: Marijo Conception, M.D.   On: 01/30/2015 17:00    Assessment/Plan  44 yo female with intractable migraine  Principal  Problem:   Intractable migraine-  obs for DHE.  Neurology following, premedicate with compazine q 8 hours x 2 doses per neuro recommendations. May benefit from prophylactic therapy, pt states she is not taking topomax daily only for acute episodes.  Also prn zofran.  Active Problems:   Sleep apnea, obstructive   Uncontrolled diabetes mellitus-  ssi   Essential hypertension, benign- clarify home meds pending pharm evaluation.  obs on medical bed.  Full code.  Tagan Bartram A 01/30/2015, 6:06 PM

## 2015-01-30 NOTE — ED Notes (Signed)
Reports she has had a migraine since Saturday and has tried OTC meds without much relief. Reports some blurred vision in the left eye and itching in that eye.

## 2015-01-31 NOTE — Progress Notes (Signed)
0215 pt stated to RN "I cannot get any sleep, the IV pump is to noisy when it goes off, my headache is gone, take this IV out, I want to go home now". RN attempted to rationalized w/pt to wait till AM when physicians rounded, stated to RN "no; I want to leave now". [facility] on call paged with no call return. Neurology on call paged, call returned, to notify Internal Medicine on call. PIV to [place] d/c, site w/o infiltration, IV cath tip intact. RN educ pt on policies r/t AMA, verbalized understanding. Pt d/c to home via w/c per staff, denies pain, skin intact.

## 2015-03-06 IMAGING — CR DG SHOULDER 2+V*L*
3 series · 3 of 3 positions shown · non-contrast
Comparison: None.

CLINICAL DATA: Pain after reaching 2 close a window 2 weeks ago.

EXAM:
LEFT SHOULDER - 2+ VIEW

[w shoulder internal left]
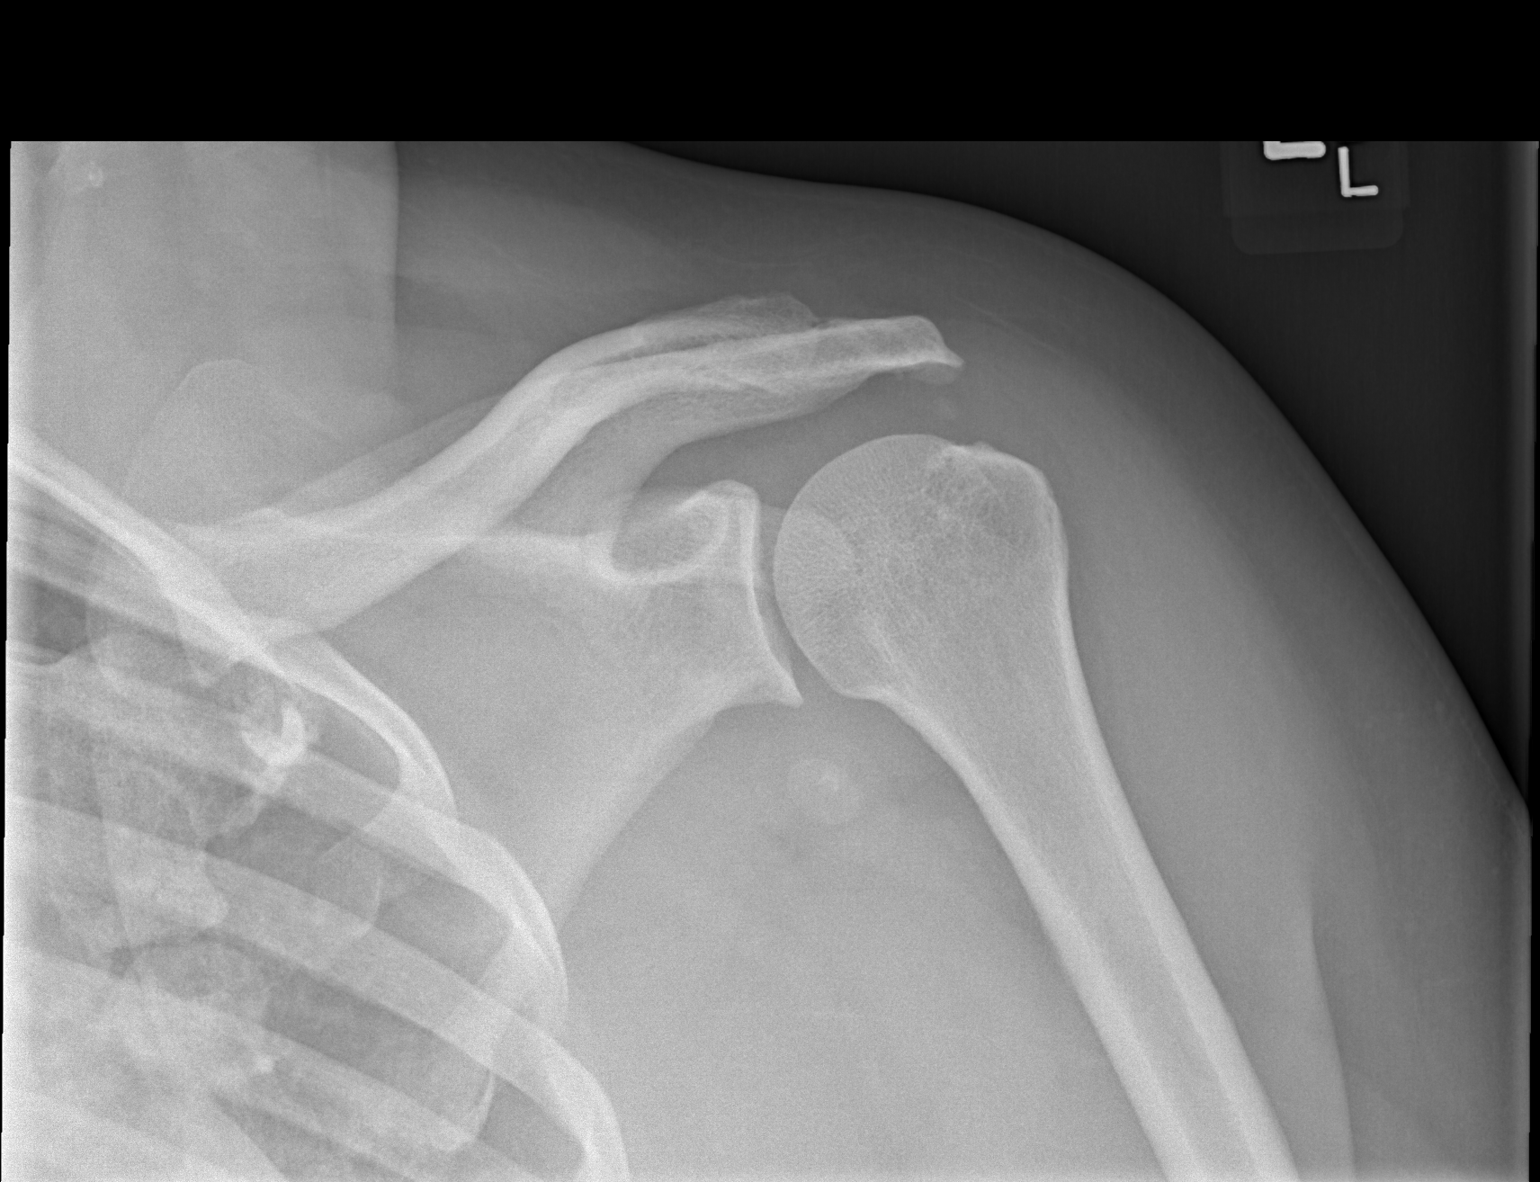

[w shoulder y-view left]
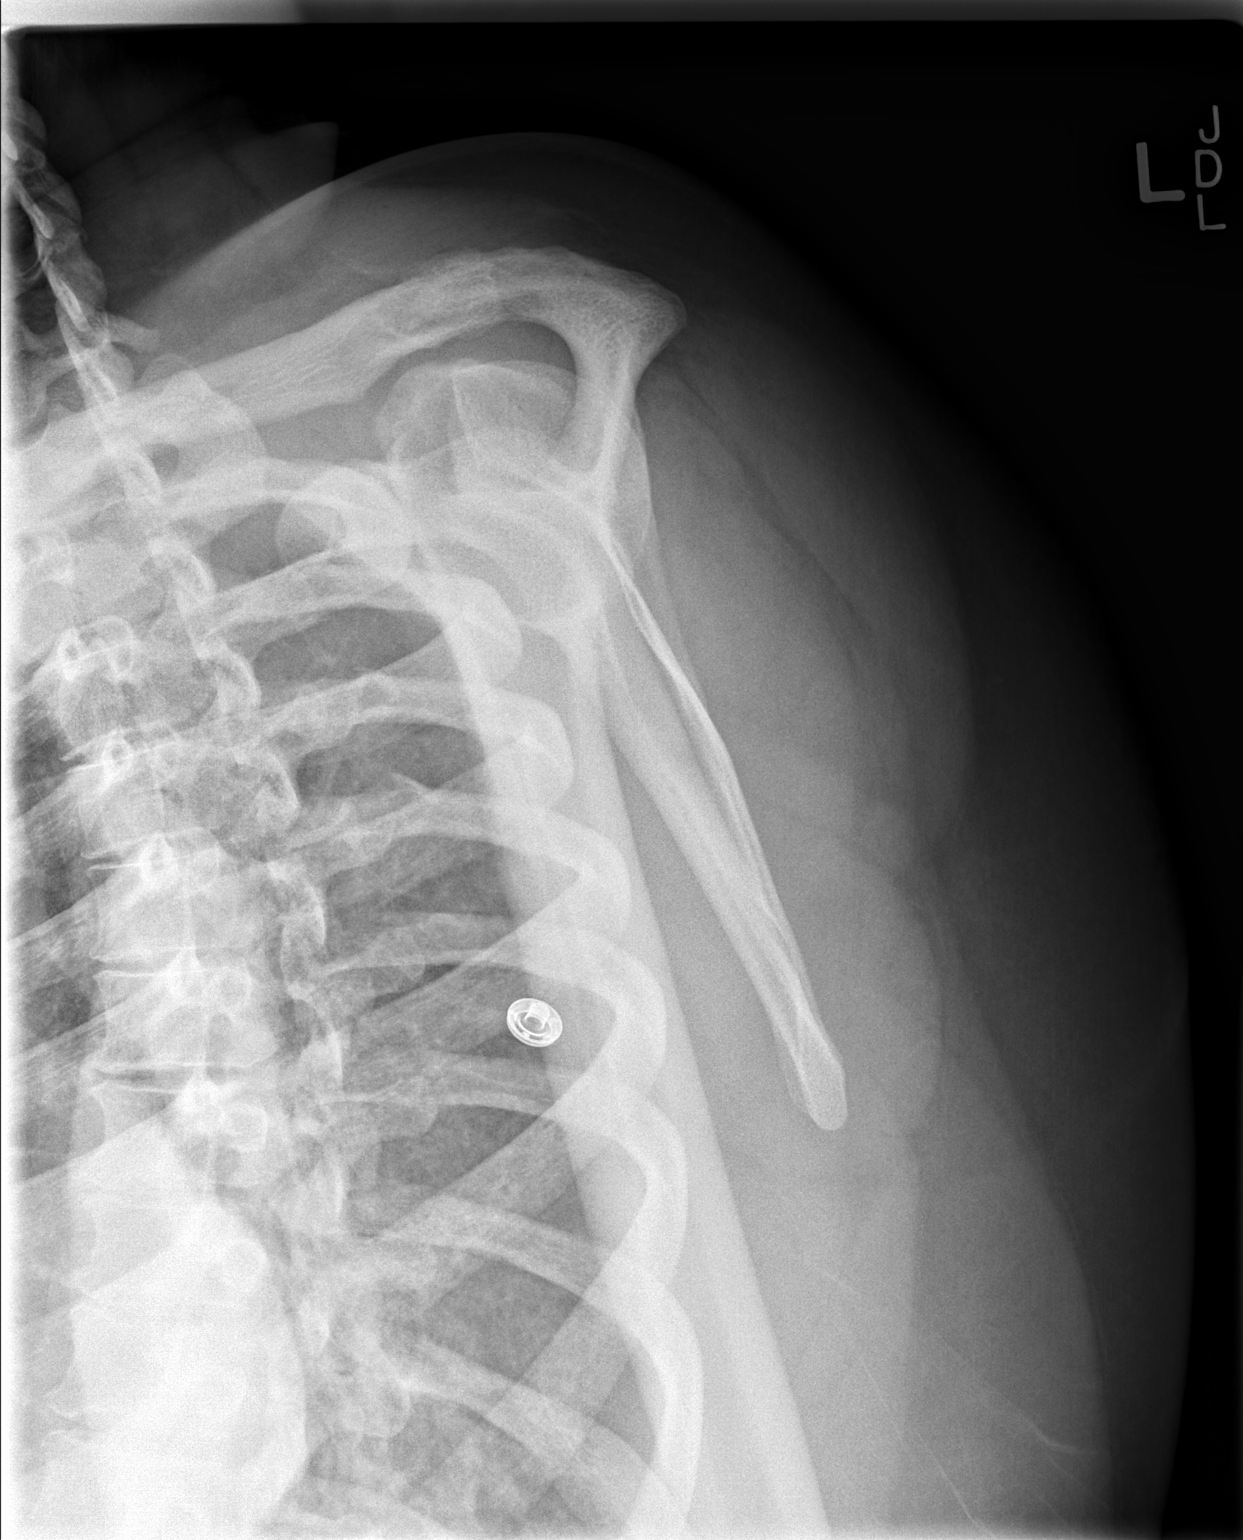

[x shoulder axillary left]
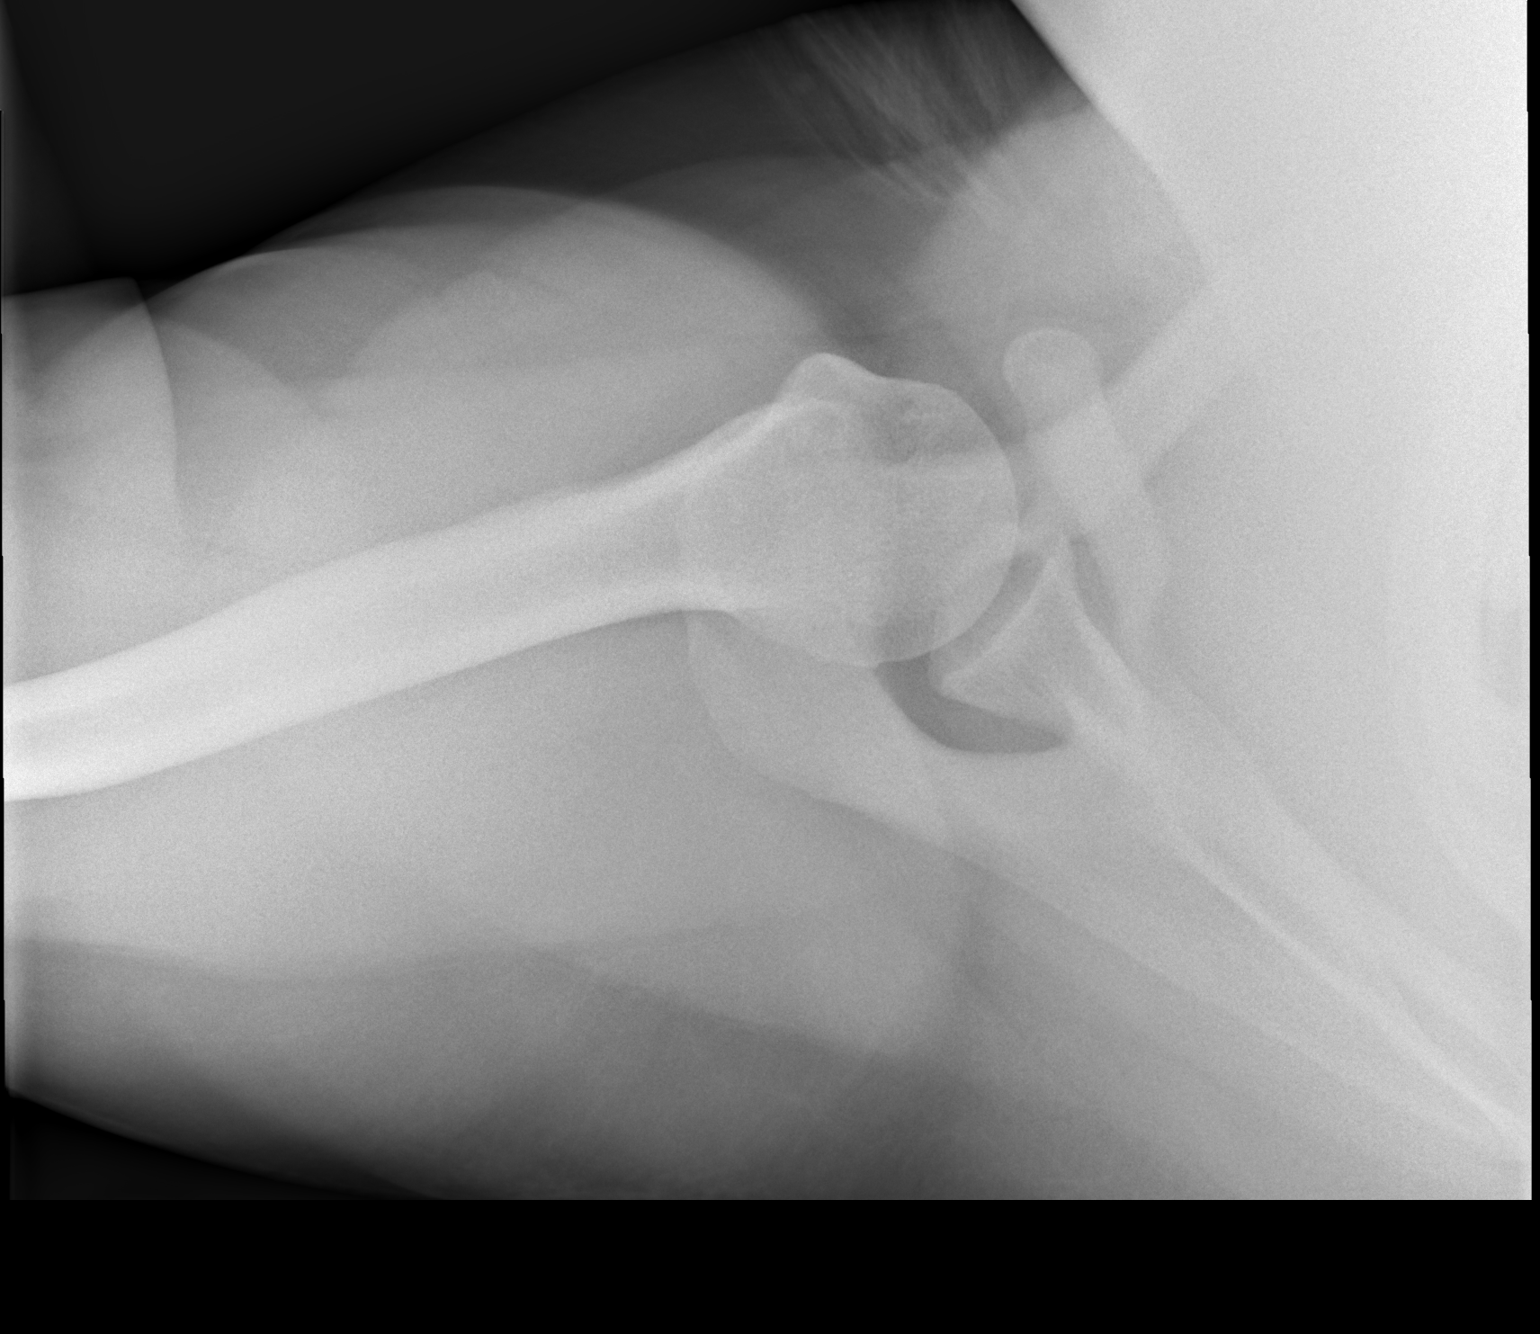

[3 of 3 positions shown; findings below may reference images not displayed]

FINDINGS: The acromioclavicular joint is aligned. The humeral head is located.
There is a small calcific density superior to the humeral head and
inferior to the acromion, which could be a small loose body, or
could be related to calcific tendinitis. No acute fracture.
IMPRESSION: 1. No acute fracture.
2. Small calcific density superior to the humeral head and inferior
to the acromion could be a small loose body, or calcification within
a rotator cuff tendon.

## 2015-03-19 ENCOUNTER — Other Ambulatory Visit: Payer: Self-pay

## 2015-04-06 ENCOUNTER — Encounter: Payer: Self-pay | Admitting: Internal Medicine

## 2015-04-06 ENCOUNTER — Ambulatory Visit: Payer: Medicare Other | Attending: Internal Medicine | Admitting: Internal Medicine

## 2015-04-06 ENCOUNTER — Other Ambulatory Visit: Payer: Self-pay | Admitting: Neurology

## 2015-04-06 VITALS — BP 142/94 | HR 71 | Temp 97.6°F | Resp 18

## 2015-04-06 DIAGNOSIS — E119 Type 2 diabetes mellitus without complications: Secondary | ICD-10-CM | POA: Diagnosis not present

## 2015-04-06 DIAGNOSIS — I10 Essential (primary) hypertension: Secondary | ICD-10-CM | POA: Diagnosis not present

## 2015-04-06 DIAGNOSIS — R21 Rash and other nonspecific skin eruption: Secondary | ICD-10-CM

## 2015-04-06 DIAGNOSIS — G629 Polyneuropathy, unspecified: Secondary | ICD-10-CM | POA: Diagnosis not present

## 2015-04-06 LAB — POCT GLYCOSYLATED HEMOGLOBIN (HGB A1C): HEMOGLOBIN A1C: 8.6

## 2015-04-06 LAB — GLUCOSE, POCT (MANUAL RESULT ENTRY): POC Glucose: 151 mg/dl — AB (ref 70–99)

## 2015-04-06 MED ORDER — DAPAGLIFLOZIN PROPANEDIOL 5 MG PO TABS: 5.0000 mg | ORAL_TABLET | Freq: Every day | ORAL | Status: DC

## 2015-04-06 MED ORDER — SERTRALINE HCL 50 MG PO TABS: 50.0000 mg | ORAL_TABLET | Freq: Every day | ORAL | Status: DC

## 2015-04-06 MED ORDER — GABAPENTIN 300 MG PO CAPS: 300.0000 mg | ORAL_CAPSULE | Freq: Three times a day (TID) | ORAL | Status: DC

## 2015-04-06 MED ORDER — HYDROCHLOROTHIAZIDE 25 MG PO TABS: 25.0000 mg | ORAL_TABLET | Freq: Every day | ORAL | Status: DC

## 2015-04-06 MED ORDER — LOSARTAN POTASSIUM 50 MG PO TABS: 50.0000 mg | ORAL_TABLET | Freq: Every day | ORAL | Status: DC

## 2015-04-06 MED ORDER — CARVEDILOL 3.125 MG PO TABS: ORAL_TABLET | ORAL | Status: DC

## 2015-04-06 MED ORDER — TRIAMCINOLONE ACETONIDE 0.1 % EX CREA: 1.0000 "application " | TOPICAL_CREAM | Freq: Two times a day (BID) | CUTANEOUS | Status: DC

## 2015-04-06 NOTE — Patient Instructions (Addendum)
I have sent you a referral for dental and vision. If you have not heard anything in 2 weeks please call me back  I have added a additional blood pressure medication called Losartan. You will need to come back in 2 weeks for BP recheck and I will check your potassium level at that time as well.

## 2015-04-06 NOTE — Progress Notes (Signed)
Patient ID: [name], female   DOB: [birth year], 44 y.o.   MRN: [id number] 1. HTN: Medication: Norvasc, HCTZ, Coreg,  Home BP monitoring: SBP range: 150's DBP range 90's Positive ROS headaches, blurred vision Negative GPQ:DIYME Was unable to go back to Neurologist for headaches. She has been taking imitrex, topamax (was told to stop medication).  2. DM2:  Medication: Lantus, Farxiga Home CBG monitoring: fasting 220-385, Lunch 160-170 , checks 3 times per day  Hypoglycemic event: none Positive ROS neuropathy Negative BRA:[id number]/[id number]  3. Rash. Rash on hands and feet for 2 weeks. Itchy, dark spots. Dry skin.  Social History reviewed: Smoker Former Exercise not currently  Physical Exam  Cardiovascular: Normal rate, regular rhythm and normal heart sounds.   Pulmonary/Chest: Effort normal and breath sounds normal.  Musculoskeletal: She exhibits no edema.  Skin: Skin is dry. Rash (BLE palmer surface of foot and right 3rd and 4th fingers ) noted.    Velisa was seen today for annual exam.  Diagnoses and all orders for this visit:  Type 2 diabetes mellitus without complication Orders: -     POCT glycosylated hemoglobin (Hb A1C)--8.6% -     POCT glucose (manual entry) -     Ambulatory referral to Ophthalmology -     Ambulatory referral to Dentistry -     dapagliflozin propanediol (FARXIGA) 5 MG TABS tablet; Take 5 mg by mouth daily. Patients A1C is slowly trending down. She will increase her Lantus to 20-22 units nightly depending on blood sugars.   Essential hypertension, benign Orders: -     Refill carvedilol (COREG) 3.125 MG tablet; TAKE 1 TABLET BY MOUTH 2 TIMES DAILY WITH A MEAL. -    Refill hydrochlorothiazide (HYDRODIURIL) 25 MG tablet; Take 1 tablet (25 mg total) by mouth daily. -     Begin losartan (COZAAR) 50 MG tablet; Take 1 tablet (50 mg total) by mouth daily. For Blood pressure -     Basic Metabolic Panel; Future--check potassium level  Patients BP  is still not at goal, I have added Losartan. I will recheck her in 2-3 weeks  Rash and nonspecific skin eruption Orders: -     triamcinolone cream (KENALOG) 0.1 %; Apply 1 application topically 2 (two) times daily. Will try Kenalog cream if no improvement in 3-4 weeks will refer to dermatology   Neuropathy Orders: -     Refill gabapentin (NEURONTIN) 300 MG capsule; Take 1 capsule (300 mg total) by mouth 3 (three) times daily.  Return in about 2 weeks (around 04/20/2015) for RN visit-BP check, Lab visit-bmet, and 3 mo PCP .  Lance Bosch, NP 04/06/2015 2:13 PM

## 2015-04-06 NOTE — Telephone Encounter (Signed)
Rx sent.  No refills given since patient missed appointment in March.  She will need follow up.

## 2015-04-06 NOTE — Progress Notes (Signed)
Patient here for routine check up without pelvic exam. Patient reports she had a pelvic exam not too long ago. Patient denies any pain today. Patient needs refills on all her medications she is taking except lantus, zoloft, fraxiga, and carvediolol.   Patient A1C is 8.6%. CBG is 151.

## 2015-04-09 DIAGNOSIS — M7582 Other shoulder lesions, left shoulder: Secondary | ICD-10-CM | POA: Diagnosis not present

## 2015-04-15 ENCOUNTER — Emergency Department (HOSPITAL_COMMUNITY): Payer: Medicare Other

## 2015-04-15 ENCOUNTER — Emergency Department (HOSPITAL_COMMUNITY)
Admission: EM | Admit: 2015-04-15 | Discharge: 2015-04-15 | Disposition: A | Discharge status: Home / Self Care | Payer: Medicare Other | Attending: Emergency Medicine | Admitting: Emergency Medicine

## 2015-04-15 ENCOUNTER — Encounter (HOSPITAL_COMMUNITY): Payer: Self-pay | Admitting: *Deleted

## 2015-04-15 DIAGNOSIS — Z7982 Long term (current) use of aspirin: Secondary | ICD-10-CM | POA: Diagnosis not present

## 2015-04-15 DIAGNOSIS — K219 Gastro-esophageal reflux disease without esophagitis: Secondary | ICD-10-CM | POA: Insufficient documentation

## 2015-04-15 DIAGNOSIS — Y998 Other external cause status: Secondary | ICD-10-CM | POA: Diagnosis not present

## 2015-04-15 DIAGNOSIS — F329 Major depressive disorder, single episode, unspecified: Secondary | ICD-10-CM | POA: Diagnosis not present

## 2015-04-15 DIAGNOSIS — Y939 Activity, unspecified: Secondary | ICD-10-CM | POA: Diagnosis not present

## 2015-04-15 DIAGNOSIS — Z87891 Personal history of nicotine dependence: Secondary | ICD-10-CM | POA: Diagnosis not present

## 2015-04-15 DIAGNOSIS — Z79899 Other long term (current) drug therapy: Secondary | ICD-10-CM | POA: Diagnosis not present

## 2015-04-15 DIAGNOSIS — M25462 Effusion, left knee: Secondary | ICD-10-CM | POA: Insufficient documentation

## 2015-04-15 DIAGNOSIS — S8982XA Other specified injuries of left lower leg, initial encounter: Secondary | ICD-10-CM | POA: Diagnosis not present

## 2015-04-15 DIAGNOSIS — Y929 Unspecified place or not applicable: Secondary | ICD-10-CM | POA: Insufficient documentation

## 2015-04-15 DIAGNOSIS — J45909 Unspecified asthma, uncomplicated: Secondary | ICD-10-CM | POA: Diagnosis not present

## 2015-04-15 DIAGNOSIS — I10 Essential (primary) hypertension: Secondary | ICD-10-CM | POA: Insufficient documentation

## 2015-04-15 DIAGNOSIS — G8929 Other chronic pain: Secondary | ICD-10-CM | POA: Diagnosis not present

## 2015-04-15 DIAGNOSIS — E119 Type 2 diabetes mellitus without complications: Secondary | ICD-10-CM | POA: Diagnosis not present

## 2015-04-15 DIAGNOSIS — X58XXXA Exposure to other specified factors, initial encounter: Secondary | ICD-10-CM | POA: Diagnosis not present

## 2015-04-15 DIAGNOSIS — S8992XA Unspecified injury of left lower leg, initial encounter: Secondary | ICD-10-CM | POA: Diagnosis not present

## 2015-04-15 DIAGNOSIS — E876 Hypokalemia: Secondary | ICD-10-CM | POA: Diagnosis not present

## 2015-04-15 DIAGNOSIS — M25562 Pain in left knee: Secondary | ICD-10-CM | POA: Diagnosis not present

## 2015-04-15 DIAGNOSIS — M7989 Other specified soft tissue disorders: Secondary | ICD-10-CM | POA: Diagnosis not present

## 2015-04-15 MED ORDER — OXYCODONE-ACETAMINOPHEN 5-325 MG PO TABS
1.0000 | ORAL_TABLET | Freq: Once | ORAL | Status: AC
Start: 2015-04-15 — End: 2015-04-15
  Administered 2015-04-15: 1 via ORAL
  Filled 2015-04-15: qty 1

## 2015-04-15 MED ORDER — ONDANSETRON 4 MG PO TBDP
4.0000 mg | ORAL_TABLET | Freq: Once | ORAL | Status: AC
  Administered 2015-04-15: 4 mg via ORAL
  Filled 2015-04-15: qty 1

## 2015-04-15 MED ORDER — KETOROLAC TROMETHAMINE 60 MG/2ML IM SOLN
60.0000 mg | Freq: Once | INTRAMUSCULAR | Status: AC
  Administered 2015-04-15: 60 mg via INTRAMUSCULAR
  Filled 2015-04-15: qty 2

## 2015-04-15 MED ORDER — HYDROMORPHONE HCL 1 MG/ML IJ SOLN
1.0000 mg | Freq: Once | INTRAMUSCULAR | Status: AC
  Administered 2015-04-15: 1 mg via INTRAMUSCULAR
  Filled 2015-04-15: qty 1

## 2015-04-15 MED ORDER — OXYCODONE-ACETAMINOPHEN 5-325 MG PO TABS: 1.0000 | ORAL_TABLET | ORAL | Status: DC | PRN

## 2015-04-15 MED ORDER — OXYCODONE-ACETAMINOPHEN 5-325 MG PO TABS
ORAL_TABLET | ORAL | Status: AC
  Filled 2015-04-15: qty 1

## 2015-04-15 MED ORDER — OXYCODONE-ACETAMINOPHEN 5-325 MG PO TABS
1.0000 | ORAL_TABLET | Freq: Once | ORAL | Status: AC
  Administered 2015-04-15: 1 via ORAL

## 2015-04-15 NOTE — ED Notes (Signed)
The pt has had lt knee pain since Friday.  No known injury she felt somethiung pop and since then   She has had this pain

## 2015-04-15 NOTE — ED Provider Notes (Signed)
CSN: [id number]     Arrival date & time 04/15/15  0531 History   First MD Initiated Contact with Patient 04/15/15 0720     Chief Complaint  Patient presents with  . Knee Pain     (Consider location/radiation/quality/duration/timing/severity/associated sxs/prior Treatment) HPI Comments: Patient with multiple medical problems, including risk factors for CAD -- presents with complaint of acute onset of left knee pain starting 2 days ago after standing up from a chair. Patient felt a pop and had immediate pain, however she was able to ambulate. Pain has gradually worsened since that time. She has had some swelling of her knee. Patient states that the pain radiates from the lateral aspect of her knee joint up her thigh into her hip. No numbness or tingling. No redness or warmth. No fevers, nausea or vomiting. Patient is taking Goody powder and a muscle relaxer without relief. She denies head, neck, back, or hip injury. The onset of this condition was acute. The course is constant. Aggravating factors: movement. Alleviating factors: none.    Patient is a 44 y.o. female presenting with knee pain. The history is provided by the patient.  Knee Pain Associated symptoms: no back pain and no neck pain     Past Medical History  Diagnosis Date  . Hypertension   . Hypokalemia   . Asthma   . DM (diabetes mellitus)     INSULIN DEPENDENT  . Depression   . GERD (gastroesophageal reflux disease)   . Headache(784.0)   . Chronic back pain   . Chronic chest pain    Past Surgical History  Procedure Laterality Date  . Cesarean section      x 3  . Appendectomy    . Vesicovaginal fistula closure w/ tah  2009  . Hernia repair    . Cholecystectomy N/A 03/02/2014    Procedure: LAPAROSCOPIC CHOLECYSTECTOMY;  Surgeon: Joyice Faster. Cornett, MD;  Location: [facility];  Service: General;  Laterality: N/A;   Family History  Problem Relation Age of Onset  . Heart attack Mother   . Stroke Mother   . Diabetes Mother    . Hypertension Mother   . Arthritis Mother   . Stroke Father   . Hypertension Sister   . Diabetes Sister   . Seizures Brother   . Diabetes Brother    History  Substance Use Topics  . Smoking status: Former Smoker -- 0.30 packs/day for 22 years    Types: Cigarettes    Quit date: 05/01/2011  . Smokeless tobacco: Never Used  . Alcohol Use: 0.0 oz/week    0 Standard drinks or equivalent per week     Comment: rare   OB History    No data available     Review of Systems  Constitutional: Negative for activity change.  Musculoskeletal: Positive for joint swelling, arthralgias and gait problem. Negative for back pain and neck pain.  Skin: Negative for wound.  Neurological: Negative for weakness and numbness.      Allergies  Reglan  Home Medications   Prior to Admission medications   Medication Sig Start Date End Date Taking? Authorizing Provider  albuterol (PROVENTIL HFA;VENTOLIN HFA) 108 (90 BASE) MCG/ACT inhaler Inhale 2 puffs into the lungs every 6 (six) hours as needed for wheezing or shortness of breath.    Historical Provider, MD  albuterol (PROVENTIL) (2.5 MG/3ML) 0.083% nebulizer solution Take 2.5 mg by nebulization every 6 (six) hours as needed for wheezing or shortness of breath.    Historical Provider,  MD  amLODipine (NORVASC) 10 MG tablet Take 10 mg by mouth daily.    Historical Provider, MD  aspirin EC 325 MG tablet Take 325 mg by mouth daily.    Historical Provider, MD  aspirin-acetaminophen-caffeine (EXCEDRIN MIGRAINE) [phone] MG per tablet Take 2 tablets by mouth every 6 (six) hours as needed for headache.    Historical Provider, MD  carvedilol (COREG) 3.125 MG tablet TAKE 1 TABLET BY MOUTH 2 TIMES DAILY WITH A MEAL. 04/06/15   Lance Bosch, NP  cyclobenzaprine (FLEXERIL) 5 MG tablet TAKE 1 TABLET BY MOUTH 3 TIMES DAILY AS NEEDED FOR MUSCLE SPASMS. 12/14/14   Lance Bosch, NP  dapagliflozin propanediol (FARXIGA) 5 MG TABS tablet Take 5 mg by mouth daily.  04/06/15   Lance Bosch, NP  fluconazole (DIFLUCAN) 150 MG tablet Take 1 tablet (150 mg total) by mouth daily. 09/26/14   Lance Bosch, NP  Fluticasone-Salmeterol (ADVAIR) 250-50 MCG/DOSE AEPB Inhale 1 puff into the lungs 2 times daily at 12 noon and 4 pm. Patient taking differently: Inhale 1 puff into the lungs daily as needed (shortness of breath).  06/06/14   Carly Montey Hora, MD  gabapentin (NEURONTIN) 300 MG capsule Take 1 capsule (300 mg total) by mouth 3 (three) times daily. 04/06/15   Lance Bosch, NP  hydrochlorothiazide (HYDRODIURIL) 25 MG tablet Take 1 tablet (25 mg total) by mouth daily. 04/06/15   Lance Bosch, NP  insulin glargine (LANTUS) 100 UNIT/ML injection Inject 0.18 mLs (18 Units total) into the skin at bedtime. 07/26/14   Lance Bosch, NP  ipratropium (ATROVENT) 0.02 % nebulizer solution Take 0.5 mg by nebulization every 6 (six) hours as needed for wheezing or shortness of breath.     Historical Provider, MD  losartan (COZAAR) 50 MG tablet Take 1 tablet (50 mg total) by mouth daily. For Blood pressure 04/06/15   Lance Bosch, NP  metroNIDAZOLE (FLAGYL) 500 MG tablet Take 1 tablet (500 mg total) by mouth 2 (two) times daily. Patient not taking: Reported on 12/13/2014 09/06/14   Lance Bosch, NP  oxyCODONE-acetaminophen (PERCOCET/ROXICET) 5-325 MG per tablet Take 1-2 tablets by mouth every 6 (six) hours as needed for severe pain. Patient not taking: Reported on 12/13/2014 08/06/14   Carlisle Cater, PA-C  predniSONE (STERAPRED UNI-PAK) 10 MG tablet Take by mouth daily. 5-4-3-2-1. Take 5 pills today and decrease by one pill daily. Patient not taking: Reported on 01/30/2015 12/13/14   Lance Bosch, NP  promethazine (PHENERGAN) 25 MG tablet Take 1 tablet (25 mg total) by mouth every 6 (six) hours as needed for nausea. Patient not taking: Reported on 12/13/2014 11/19/14   Davonna Belling, MD  sertraline (ZOLOFT) 50 MG tablet Take 1 tablet (50 mg total) by mouth daily. 04/06/15   Lance Bosch, NP  simvastatin (ZOCOR) 10 MG tablet Take 10 mg by mouth daily at 6 PM.     Historical Provider, MD  SUMAtriptan (IMITREX) 100 MG tablet TAKE 1TAB AT EARLIEST ONSET OF HEADACHE. MAY REPEAT X1 IN 2 HOURS IF HEADACHE PERSISTS OR RECURS. DO NOT EXCEED 2 TABS IN 24HRS 04/06/15   Pieter Partridge, DO  traMADol (ULTRAM) 50 MG tablet Take 50 mg by mouth every 6 (six) hours as needed for moderate pain or severe pain.    Historical Provider, MD  triamcinolone cream (KENALOG) 0.1 % Apply 1 application topically 2 (two) times daily. 04/06/15   Lance Bosch, NP  TRUETEST TEST test  strip  06/28/14   Historical Provider, MD   BP 130/97 mmHg  Pulse 93  Temp(Src) 98.2 F (36.8 C)  Resp 20  Ht 5\' 1"  (1.549 m)  Wt 232 lb (105.235 kg)  BMI 43.86 kg/m2  SpO2 99%   Physical Exam  Constitutional: She appears well-developed and well-nourished.  HENT:  Head: Normocephalic and atraumatic.  Eyes: Pupils are equal, round, and reactive to light.  Neck: Normal range of motion. Neck supple.  Cardiovascular: Exam reveals no decreased pulses.   Musculoskeletal: She exhibits tenderness. She exhibits no edema.       Left hip: Normal.       Left knee: She exhibits decreased range of motion (2/2 pain, patient holds slightly flexed), swelling and effusion (mild). Tenderness found. Lateral joint line tenderness noted.       Lumbar back: Normal.       Left upper leg: Normal. She exhibits no tenderness, no bony tenderness and no swelling.  Neurological: She is alert. No sensory deficit.  Motor, sensation, and vascular distal to the injury is fully intact.   Skin: Skin is warm and dry.  Psychiatric: She has a normal mood and affect.  Nursing note and vitals reviewed.   ED Course  Procedures (including critical care time) Labs Review Labs Reviewed - No data to display  Imaging Review Dg Knee Complete 4 Views Left  04/15/2015   CLINICAL DATA:  Felt left knee pop while getting up. Left knee pain and swelling,  acute onset. Initial encounter.  EXAM: LEFT KNEE - COMPLETE 4+ VIEW  COMPARISON:  None.  FINDINGS: There is no evidence of fracture or dislocation. The joint spaces are preserved. No significant degenerative change is seen; the patellofemoral joint is grossly unremarkable in appearance.  Trace knee joint fluid remains within normal limits. The visualized soft tissues are normal in appearance.  IMPRESSION: No evidence of fracture or dislocation. If the patient's symptoms persist, MRI could be considered for further evaluation, to assess for internal derangement.   Electronically Signed   By: Garald Balding M.D.   On: 04/15/2015 07:14     EKG Interpretation None       8:32 AM Patient seen and examined. Work-up initiated. Medications ordered.   Vital signs reviewed and are as follows: BP 130/97 mmHg  Pulse 93  Temp(Src) 98.2 F (36.8 C)  Resp 20  Ht 5\' 1"  (1.549 m)  Wt 232 lb (105.235 kg)  BMI 43.86 kg/m2  SpO2 99%  10:25 AM Patient feels better after pain medications. She has been fitted with immobilizer and crutches.   Will d/c to home with Percocet. Toradol given here but would be nervous about long-term NSAIDs given family history and CAD risk factors. Encouraged ortho f/u in next week for further evaluation.   Patient counseled on use of narcotic pain medications. Counseled not to combine these medications with others containing tylenol. Urged not to drink alcohol, drive, or perform any other activities that requires focus while taking these medications. The patient verbalizes understanding and agrees with the plan.    MDM   Final diagnoses:  Knee injury, left, initial encounter  Knee effusion, left   Patient with knee pain and swelling after feeling a 'pop' after standing. X-ray neg. She has small effusion on exam. No signs of DVT. She is moving hip well. Possible ligamentous injury. Patient made non-weight bearing. She is to f/u with ortho for eval and treatment. Pain  medication and RICE in the interim. LE with  normal pulses and is neurovascularly intact. Doubt knee dislocation or vascular injury.     Carlisle Cater, PA-C 04/15/15 Canyon Yao, MD 04/16/15 [phone]

## 2015-04-15 NOTE — Discharge Instructions (Signed)
Please read and follow all provided instructions.  Your diagnoses today include:  1. Knee injury, left, initial encounter   2. Knee effusion, left    Tests performed today include:  An x-ray of the affected area - does NOT show any broken bones  Vital signs. See below for your results today.   Medications prescribed:   Percocet (oxycodone/acetaminophen) - narcotic pain medication  DO NOT drive or perform any activities that require you to be awake and alert because this medicine can make you drowsy. BE VERY CAREFUL not to take multiple medicines containing Tylenol (also called acetaminophen). Doing so can lead to an overdose which can damage your liver and cause liver failure and possibly death.  Take any prescribed medications only as directed.  Home care instructions:   Follow any educational materials contained in this packet  Follow R.I.C.E. Protocol:  R - rest your injury   I  - use ice on injury without applying directly to skin  C - compress injury with bandage or splint  E - elevate the injury as much as possible  Follow-up instructions: Please follow-up with your primary care provider or the provided orthopedic physician (bone specialist) in 1 week.   Return instructions:   Please return if your toes or feet are numb or tingling, appear gray or blue, or you have severe pain (also elevate the leg and loosen splint or wrap if you were given one)  Please return to the Emergency Department if you experience worsening symptoms.   Please return if you have any other emergent concerns.  Additional Information:  Your vital signs today were: BP 148/94 mmHg   Pulse 87   Temp(Src) 98.2 F (36.8 C)   Resp 20   Ht 5\' 1"  (1.549 m)   Wt 232 lb (105.235 kg)   BMI 43.86 kg/m2   SpO2 99% If your blood pressure (BP) was elevated above 135/85 this visit, please have this repeated by your doctor within one month. --------------

## 2015-04-15 NOTE — Progress Notes (Signed)
Orthopedic Tech Progress Note Patient Details:  [name] 03-15-1971 [id number]  Ortho Devices Type of Ortho Device: Knee Immobilizer, Crutches Ortho Device/Splint Location: lle Ortho Device/Splint Interventions: Application   [name] 04/15/2015, 10:04 AM

## 2015-05-02 ENCOUNTER — Other Ambulatory Visit: Payer: Medicare Other

## 2015-05-03 ENCOUNTER — Emergency Department (HOSPITAL_COMMUNITY)
Admission: EM | Admit: 2015-05-03 | Discharge: 2015-05-03 | Disposition: A | Discharge status: Home / Self Care | Payer: Medicare Other | Attending: Emergency Medicine | Admitting: Emergency Medicine

## 2015-05-03 ENCOUNTER — Encounter (HOSPITAL_COMMUNITY): Payer: Self-pay | Admitting: Emergency Medicine

## 2015-05-03 ENCOUNTER — Emergency Department (HOSPITAL_COMMUNITY): Payer: Medicare Other

## 2015-05-03 DIAGNOSIS — Z7982 Long term (current) use of aspirin: Secondary | ICD-10-CM | POA: Insufficient documentation

## 2015-05-03 DIAGNOSIS — E119 Type 2 diabetes mellitus without complications: Secondary | ICD-10-CM | POA: Insufficient documentation

## 2015-05-03 DIAGNOSIS — M19012 Primary osteoarthritis, left shoulder: Secondary | ICD-10-CM | POA: Diagnosis not present

## 2015-05-03 DIAGNOSIS — K219 Gastro-esophageal reflux disease without esophagitis: Secondary | ICD-10-CM | POA: Insufficient documentation

## 2015-05-03 DIAGNOSIS — M7542 Impingement syndrome of left shoulder: Secondary | ICD-10-CM | POA: Diagnosis not present

## 2015-05-03 DIAGNOSIS — J45909 Unspecified asthma, uncomplicated: Secondary | ICD-10-CM | POA: Diagnosis not present

## 2015-05-03 DIAGNOSIS — R0789 Other chest pain: Secondary | ICD-10-CM | POA: Diagnosis not present

## 2015-05-03 DIAGNOSIS — R079 Chest pain, unspecified: Secondary | ICD-10-CM | POA: Diagnosis not present

## 2015-05-03 DIAGNOSIS — Z87891 Personal history of nicotine dependence: Secondary | ICD-10-CM | POA: Diagnosis not present

## 2015-05-03 DIAGNOSIS — F329 Major depressive disorder, single episode, unspecified: Secondary | ICD-10-CM | POA: Diagnosis not present

## 2015-05-03 DIAGNOSIS — E876 Hypokalemia: Secondary | ICD-10-CM | POA: Insufficient documentation

## 2015-05-03 DIAGNOSIS — J984 Other disorders of lung: Secondary | ICD-10-CM | POA: Diagnosis not present

## 2015-05-03 DIAGNOSIS — G8929 Other chronic pain: Secondary | ICD-10-CM | POA: Diagnosis not present

## 2015-05-03 DIAGNOSIS — Z79899 Other long term (current) drug therapy: Secondary | ICD-10-CM | POA: Insufficient documentation

## 2015-05-03 DIAGNOSIS — M24112 Other articular cartilage disorders, left shoulder: Secondary | ICD-10-CM | POA: Diagnosis not present

## 2015-05-03 DIAGNOSIS — I10 Essential (primary) hypertension: Secondary | ICD-10-CM | POA: Diagnosis not present

## 2015-05-03 DIAGNOSIS — R0602 Shortness of breath: Secondary | ICD-10-CM | POA: Diagnosis not present

## 2015-05-03 DIAGNOSIS — R072 Precordial pain: Secondary | ICD-10-CM | POA: Diagnosis not present

## 2015-05-03 DIAGNOSIS — S46012A Strain of muscle(s) and tendon(s) of the rotator cuff of left shoulder, initial encounter: Secondary | ICD-10-CM | POA: Diagnosis not present

## 2015-05-03 DIAGNOSIS — G8918 Other acute postprocedural pain: Secondary | ICD-10-CM | POA: Diagnosis not present

## 2015-05-03 DIAGNOSIS — M958 Other specified acquired deformities of musculoskeletal system: Secondary | ICD-10-CM | POA: Diagnosis not present

## 2015-05-03 DIAGNOSIS — M75122 Complete rotator cuff tear or rupture of left shoulder, not specified as traumatic: Secondary | ICD-10-CM | POA: Diagnosis not present

## 2015-05-03 DIAGNOSIS — Y929 Unspecified place or not applicable: Secondary | ICD-10-CM | POA: Diagnosis not present

## 2015-05-03 DIAGNOSIS — M7552 Bursitis of left shoulder: Secondary | ICD-10-CM | POA: Diagnosis not present

## 2015-05-03 DIAGNOSIS — X58XXXA Exposure to other specified factors, initial encounter: Secondary | ICD-10-CM | POA: Diagnosis not present

## 2015-05-03 DIAGNOSIS — Z791 Long term (current) use of non-steroidal anti-inflammatories (NSAID): Secondary | ICD-10-CM | POA: Insufficient documentation

## 2015-05-03 LAB — COMPREHENSIVE METABOLIC PANEL
ALT: 15 U/L (ref 14–54)
AST: 38 U/L (ref 15–41)
Albumin: 3.3 g/dL — ABNORMAL LOW (ref 3.5–5.0)
Alkaline Phosphatase: 70 U/L (ref 38–126)
Anion gap: 11 (ref 5–15)
BILIRUBIN TOTAL: 1.4 mg/dL — AB (ref 0.3–1.2)
BUN: 12 mg/dL (ref 6–20)
CO2: 23 mmol/L (ref 22–32)
Calcium: 8.5 mg/dL — ABNORMAL LOW (ref 8.9–10.3)
Chloride: 99 mmol/L — ABNORMAL LOW (ref 101–111)
Creatinine, Ser: 1 mg/dL (ref 0.44–1.00)
GFR calc non Af Amer: >60 mL/min (ref >60)
Glucose, Bld: 179 mg/dL — ABNORMAL HIGH (ref 65–99)
Potassium: 5.5 mmol/L — ABNORMAL HIGH (ref 3.5–5.1)
Sodium: 133 mmol/L — ABNORMAL LOW (ref 135–145)
TOTAL PROTEIN: 6.5 g/dL (ref 6.5–8.1)

## 2015-05-03 LAB — CBC
HCT: 37.3 % (ref 36.0–46.0)
HEMOGLOBIN: 12.6 g/dL (ref 12.0–15.0)
MCH: 29.4 pg (ref 26.0–34.0)
MCHC: 33.8 g/dL (ref 30.0–36.0)
MCV: 86.9 fL (ref 78.0–100.0)
PLATELETS: 333 10*3/uL (ref 150–400)
RBC: 4.29 MIL/uL (ref 3.87–5.11)
RDW: 14.5 % (ref 11.5–15.5)
WBC: 10.3 10*3/uL (ref 4.0–10.5)

## 2015-05-03 LAB — I-STAT CHEM 8, ED
BUN: 12 mg/dL (ref 6–20)
CHLORIDE: 102 mmol/L (ref 101–111)
Calcium, Ion: 1.12 mmol/L (ref 1.12–1.23)
Creatinine, Ser: 0.9 mg/dL (ref 0.44–1.00)
Glucose, Bld: 159 mg/dL — ABNORMAL HIGH (ref 65–99)
HEMATOCRIT: 36 % (ref 36.0–46.0)
Hemoglobin: 12.2 g/dL (ref 12.0–15.0)
POTASSIUM: 3.5 mmol/L (ref 3.5–5.1)
SODIUM: 137 mmol/L (ref 135–145)
TCO2: 22 mmol/L (ref 0–100)

## 2015-05-03 LAB — I-STAT TROPONIN, ED
TROPONIN I, POC: 0 ng/mL (ref 0.00–0.08)
TROPONIN I, POC: 0 ng/mL (ref 0.00–0.08)

## 2015-05-03 MED ORDER — IOHEXOL 350 MG/ML SOLN
100.0000 mL | Freq: Once | INTRAVENOUS | Status: AC | PRN
  Administered 2015-05-03: 100 mL via INTRAVENOUS

## 2015-05-03 MED ORDER — HYDROMORPHONE HCL 1 MG/ML IJ SOLN
1.0000 mg | Freq: Once | INTRAMUSCULAR | Status: AC
  Administered 2015-05-03: 1 mg via INTRAVENOUS
  Filled 2015-05-03: qty 1

## 2015-05-03 NOTE — ED Notes (Signed)
Left arm still in surgical sling. Explained pain management and no morphine dose before leaving due to previous med administrations.  Patient acknowledges, she reports she can get medication at home. Spouse coming to pick her up and drive.

## 2015-05-03 NOTE — ED Notes (Signed)
Pt had rotator cuff surgery and after surgery pt had substernal chest pressure 9/10. Pt was given 325mg  ASA 1 nitro tablet. On arrrival pt c/o chest pressure but denies and sob n/v.

## 2015-05-03 NOTE — ED Provider Notes (Signed)
CSN: [id number]     Arrival date & time 05/03/15  1624 History   First MD Initiated Contact with Patient 05/03/15 1625     Chief Complaint  Patient presents with  . Chest Pain     (Consider location/radiation/quality/duration/timing/severity/associated sxs/prior Treatment) Patient is a 44 y.o. female presenting with chest pain.  Chest Pain Pain location:  Substernal area Pain quality: tightness   Pain radiates to:  Does not radiate Pain radiates to the back: no   Pain severity:  Severe Onset quality:  Unable to specify Duration:  1 hour Timing:  Constant Progression:  Unchanged Chronicity:  New Context comment:  Woke up after rotator cuff repair with chest pressure Relieved by:  Nothing Worsened by:  Coughing and deep breathing Ineffective treatments:  None tried Associated symptoms: shortness of breath   Associated symptoms: no abdominal pain, no fever, no nausea and not vomiting     Past Medical History  Diagnosis Date  . Hypertension   . Hypokalemia   . Asthma   . DM (diabetes mellitus)     INSULIN DEPENDENT  . Depression   . GERD (gastroesophageal reflux disease)   . Headache(784.0)   . Chronic back pain   . Chronic chest pain    Past Surgical History  Procedure Laterality Date  . Cesarean section      x 3  . Appendectomy    . Vesicovaginal fistula closure w/ tah  2009  . Hernia repair    . Cholecystectomy N/A 03/02/2014    Procedure: LAPAROSCOPIC CHOLECYSTECTOMY;  Surgeon: Joyice Faster. Cornett, MD;  Location: [facility];  Service: General;  Laterality: N/A;  . Rotator cuff repair     Family History  Problem Relation Age of Onset  . Heart attack Mother   . Stroke Mother   . Diabetes Mother   . Hypertension Mother   . Arthritis Mother   . Stroke Father   . Hypertension Sister   . Diabetes Sister   . Seizures Brother   . Diabetes Brother    Social History  Substance Use Topics  . Smoking status: Former Smoker -- 0.30 packs/day for 22 years    Types:  Cigarettes    Quit date: 05/01/2011  . Smokeless tobacco: Never Used  . Alcohol Use: 0.0 oz/week    0 Standard drinks or equivalent per week     Comment: rare   OB History    No data available     Review of Systems  Constitutional: Negative for fever.  Respiratory: Positive for shortness of breath.   Cardiovascular: Positive for chest pain.  Gastrointestinal: Negative for nausea, vomiting and abdominal pain.  All other systems reviewed and are negative.     Allergies  Reglan  Home Medications   Prior to Admission medications   Medication Sig Start Date End Date Taking? Authorizing Provider  albuterol (PROVENTIL HFA;VENTOLIN HFA) 108 (90 BASE) MCG/ACT inhaler Inhale 2 puffs into the lungs every 6 (six) hours as needed for wheezing or shortness of breath.   Yes Historical Provider, MD  albuterol (PROVENTIL) (2.5 MG/3ML) 0.083% nebulizer solution Take 2.5 mg by nebulization every 6 (six) hours as needed for wheezing or shortness of breath.   Yes Historical Provider, MD  amLODipine (NORVASC) 10 MG tablet Take 10 mg by mouth daily.   Yes Historical Provider, MD  aspirin EC 325 MG tablet Take 325 mg by mouth daily.   Yes Historical Provider, MD  Aspirin-Acetaminophen-Caffeine (GOODY HEADACHE PO) Take 1 Package by mouth  daily as needed (for leg pain).   Yes Historical Provider, MD  carvedilol (COREG) 3.125 MG tablet TAKE 1 TABLET BY MOUTH 2 TIMES DAILY WITH A MEAL. 04/06/15  Yes Lance Bosch, NP  cyclobenzaprine (FLEXERIL) 5 MG tablet TAKE 1 TABLET BY MOUTH 3 TIMES DAILY AS NEEDED FOR MUSCLE SPASMS. 12/14/14  Yes Lance Bosch, NP  dapagliflozin propanediol (FARXIGA) 5 MG TABS tablet Take 5 mg by mouth daily. 04/06/15  Yes Lance Bosch, NP  Fluticasone-Salmeterol (ADVAIR) 250-50 MCG/DOSE AEPB Inhale 1 puff into the lungs 2 times daily at 12 noon and 4 pm. Patient taking differently: Inhale 1 puff into the lungs daily as needed (shortness of breath).  06/06/14  Yes Carly Montey Hora, MD   gabapentin (NEURONTIN) 300 MG capsule Take 1 capsule (300 mg total) by mouth 3 (three) times daily. 04/06/15  Yes Lance Bosch, NP  hydrochlorothiazide (HYDRODIURIL) 25 MG tablet Take 1 tablet (25 mg total) by mouth daily. 04/06/15  Yes Lance Bosch, NP  insulin glargine (LANTUS) 100 UNIT/ML injection Inject 0.18 mLs (18 Units total) into the skin at bedtime. 07/26/14  Yes Lance Bosch, NP  ipratropium (ATROVENT) 0.02 % nebulizer solution Take 0.5 mg by nebulization every 6 (six) hours as needed for wheezing or shortness of breath.    Yes Historical Provider, MD  losartan (COZAAR) 50 MG tablet Take 1 tablet (50 mg total) by mouth daily. For Blood pressure 04/06/15  Yes Lance Bosch, NP  meloxicam (MOBIC) 7.5 MG tablet Take 7.5 mg by mouth daily.   Yes Historical Provider, MD  sertraline (ZOLOFT) 50 MG tablet Take 1 tablet (50 mg total) by mouth daily. 04/06/15  Yes Lance Bosch, NP  simvastatin (ZOCOR) 10 MG tablet Take 10 mg by mouth daily at 6 PM.    Yes Historical Provider, MD  SUMAtriptan (IMITREX) 100 MG tablet TAKE 1TAB AT EARLIEST ONSET OF HEADACHE. MAY REPEAT X1 IN 2 HOURS IF HEADACHE PERSISTS OR RECURS. DO NOT EXCEED 2 TABS IN 24HRS 04/06/15  Yes Adam Telford Nab, DO  traMADol (ULTRAM) 50 MG tablet Take 50 mg by mouth every 6 (six) hours as needed for moderate pain or severe pain.   Yes Historical Provider, MD  triamcinolone cream (KENALOG) 0.1 % Apply 1 application topically 2 (two) times daily. 04/06/15  Yes Lance Bosch, NP   BP 156/97 mmHg  Pulse 89  Temp(Src) 98 F (36.7 C) (Oral)  Resp 20  Ht 5\' 1"  (1.549 m)  Wt 230 lb (104.327 kg)  BMI 43.48 kg/m2  SpO2 99% Physical Exam  Constitutional: She is oriented to person, place, and time. She appears well-developed and well-nourished.  HENT:  Head: Normocephalic and atraumatic.  Right Ear: External ear normal.  Left Ear: External ear normal.  Eyes: Conjunctivae and EOM are normal. Pupils are equal, round, and reactive to light.   Neck: Normal range of motion. Neck supple.  Cardiovascular: Normal rate, regular rhythm, normal heart sounds and intact distal pulses.   Pulmonary/Chest: Effort normal and breath sounds normal.  Abdominal: Soft. Bowel sounds are normal. There is no tenderness.  Musculoskeletal: Normal range of motion.  Neurological: She is alert and oriented to person, place, and time.  Skin: Skin is warm and dry.  Vitals reviewed.   ED Course  Procedures (including critical care time) Labs Review Labs Reviewed  COMPREHENSIVE METABOLIC PANEL - Abnormal; Notable for the following:    Sodium 133 (*)    Potassium 5.5 (*)  Chloride 99 (*)    Glucose, Bld 179 (*)    Calcium 8.5 (*)    Albumin 3.3 (*)    Total Bilirubin 1.4 (*)    All other components within normal limits  I-STAT CHEM 8, ED - Abnormal; Notable for the following:    Glucose, Bld 159 (*)    All other components within normal limits  CBC  I-STAT TROPOININ, ED  I-STAT TROPOININ, ED    Imaging Review Ct Angio Chest Pe W/cm &/or Wo Cm  05/03/2015   CLINICAL DATA:  Left shoulder surgery this morning. Complains of central chest pain and shortness of breath.  EXAM: CT ANGIOGRAPHY CHEST WITH CONTRAST  TECHNIQUE: Multidetector CT imaging of the chest was performed using the standard protocol during bolus administration of intravenous contrast. Multiplanar CT image reconstructions and MIPs were obtained to evaluate the vascular anatomy.  CONTRAST:  158mL OMNIPAQUE IOHEXOL 350 MG/ML SOLN  COMPARISON:  11/10/2013 and abdominal CT from 05/24/2007  FINDINGS: Opacification of the pulmonary arteries is not optimal but there is no evidence for a pulmonary embolism.  There is extensive edema with subcutaneous gas in the left upper chest and shoulder region compatible with recent surgery. Again noted is a low-density collection along the right anterior aspect of the mediastinum. This could represent a benign cystic structure or fluid within the pericardial  recess. This area is difficult to measure but it may be slightly smaller compared to the previous examination.  There is abnormal soft tissue along the left side of the esophagus on sequence 6, image 117. This structure roughly measures 1.4 x 2.3 cm. This area is below the carina. In retrospect, there was fullness in this area on the previous CTA examination but the tissue is now more conspicuous. This abnormal soft tissue was not present in 2008.  There is no significant pleural fluid. No acute abnormality in the upper abdomen.  The trachea and mainstem bronchi are patent. There are patchy linear densities in the medial left lower lobe which likely represents atelectasis. No large areas of consolidation. Negative for pneumothorax. Right lung is clear.  Widening at the left [facility] joint is likely related to recent surgical changes.  Review of the MIP images confirms the above findings.  IMPRESSION: Negative for pulmonary embolism.  Patchy linear densities in the left lower lobe likely represents atelectasis. No large areas of consolidation.  There is prominent soft tissue in the left paraesophageal region. This is clearly new since 2008. This could represent an enlarged and abnormal lymph node. Consider a GI consultation due to the proximity of this soft tissue and the esophagus.  Again noted is low-density material in the right paratracheal region which could represent a benign mediastinum cyst or fluid in the pericardial recess.  Postsurgical changes in the left upper chest and shoulder.  These results were called by telephone at the time of interpretation on 05/03/2015 at 7:18 pm to Dr. Debby Freiberg , who verbally acknowledged these results.   Electronically Signed   By: Markus Daft M.D.   On: 05/03/2015 19:20   I, Debby Freiberg, personally reviewed and evaluated these images and lab results as part of my medical decision-making.   EKG Interpretation   Date/Time:  Thursday May 03 2015 16:43:19  EDT Ventricular Rate:  77 PR Interval:  177 QRS Duration: 94 QT Interval:  411 QTC Calculation: 465 R Axis:   28 Text Interpretation:  Sinus rhythm Low voltage, precordial leads No  significant change since last tracing  Confirmed by Debby Freiberg [phone])  on 05/03/2015 5:25:06 PM      MDM   Final diagnoses:  Chest pain, unspecified chest pain type    44 y.o. female with pertinent PMH of HTN, DM presents with chest pressure after surgery today.  On arrival vitals and physical exam as above.  Mild symptoms present during my exam.  Unfortunately, pt tachycardic to 100 during my exam.  Wu obtained as above, unremarkable.  Symptoms relieved with narcotics.  DC home in stable condition.  I have reviewed all laboratory and imaging studies if ordered as above  1. Chest pain, unspecified chest pain type         Debby Freiberg, MD 05/04/15 1820

## 2015-05-03 NOTE — Discharge Instructions (Signed)

## 2015-05-16 DIAGNOSIS — M75122 Complete rotator cuff tear or rupture of left shoulder, not specified as traumatic: Secondary | ICD-10-CM | POA: Diagnosis not present

## 2015-05-20 DIAGNOSIS — M25512 Pain in left shoulder: Secondary | ICD-10-CM | POA: Diagnosis not present

## 2015-05-21 DIAGNOSIS — M75122 Complete rotator cuff tear or rupture of left shoulder, not specified as traumatic: Secondary | ICD-10-CM | POA: Diagnosis not present

## 2015-06-04 DIAGNOSIS — M75122 Complete rotator cuff tear or rupture of left shoulder, not specified as traumatic: Secondary | ICD-10-CM | POA: Diagnosis not present

## 2015-06-09 IMAGING — MR MR SHOULDER*L* W/O CM
5 series · 35 of 40 positions shown · non-contrast
Comparison: Radiographs 08/06/2014.

CLINICAL DATA: Shoulder pain with numbness in the left upper arm
following injury approximately 4 months ago. No previous relevant
surgery. Initial encounter.

EXAM:
MRI OF THE LEFT SHOULDER WITHOUT CONTRAST
TECHNIQUE: Multiplanar, multisequence MR imaging of the shoulder was performed.
No intravenous contrast was administered.

[Series 3: T2 fat-sat · axial · 4.0mm · 0.55mm/px · z∈[-44,+31]mm · 8 of 17 slices shown (1 of 3)]
[im 1/17]
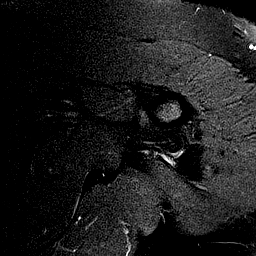
[im 3/17]
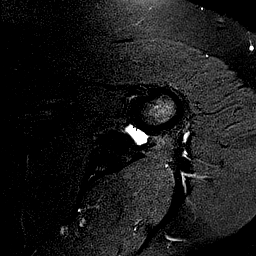
[im 5/17]
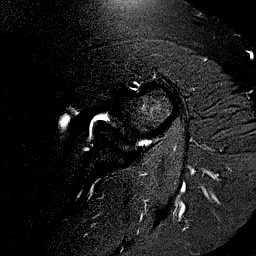
[im 7/17]
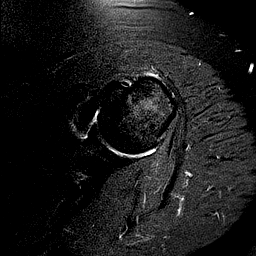
[im 10/17]
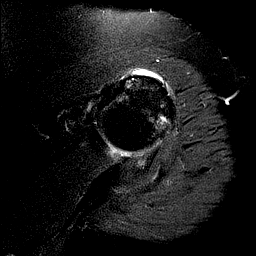
[im 12/17]
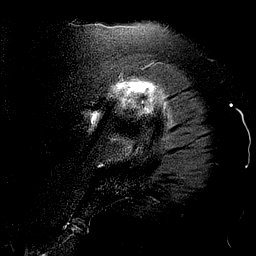
[im 14/17]
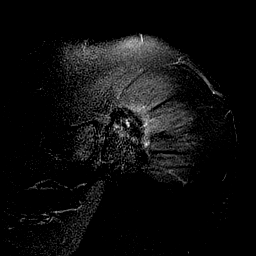
[im 17/17]
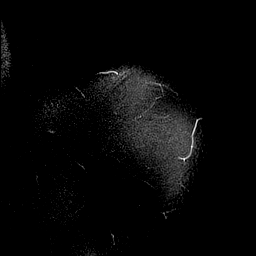

[Series 4: T2 fat-sat · oblique · 4.0mm · 0.55mm/px · 8 of 18 slices shown (2 of 3)]
[im 1/18]
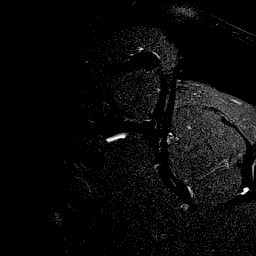
[im 3/18]
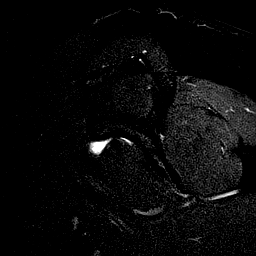
[im 5/18]
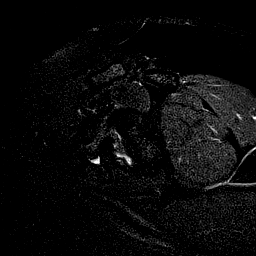
[im 8/18]
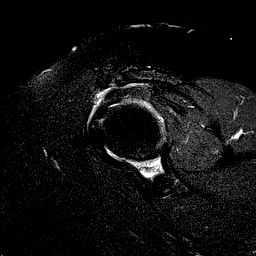
[im 10/18]
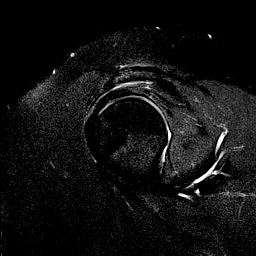
[im 13/18]
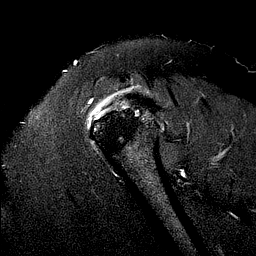
[im 15/18]
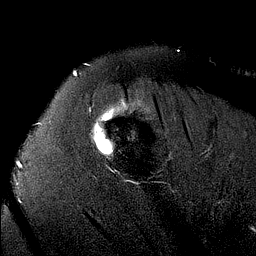
[im 18/18]
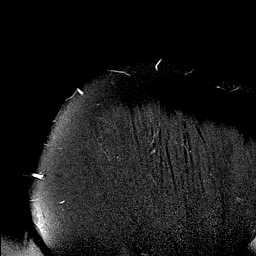

[Series 5: T2 fat-sat · coronal · 4.0mm · 0.27mm/px · 8 of 17 slices shown (3 of 3)]
[im 1/17]
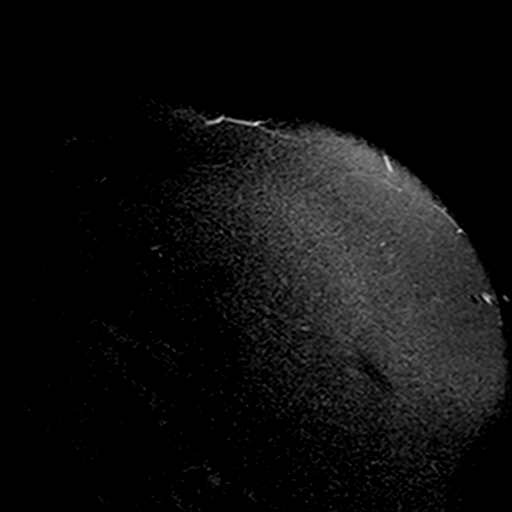
[im 3/17]
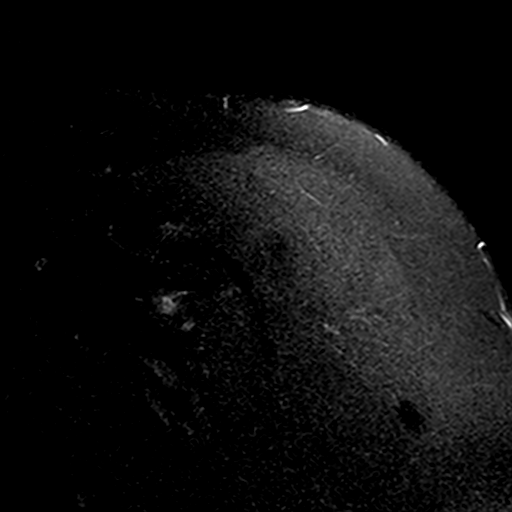
[im 5/17]
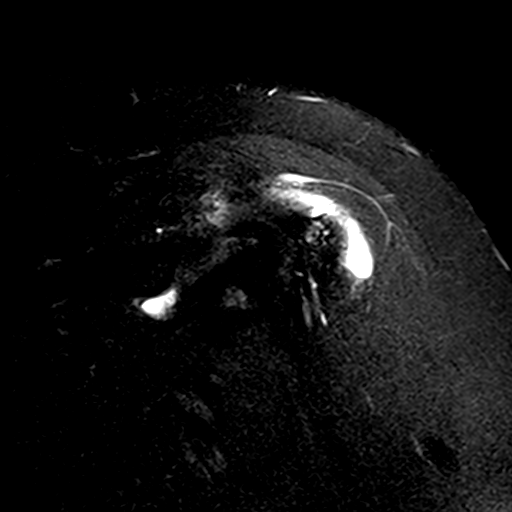
[im 7/17]
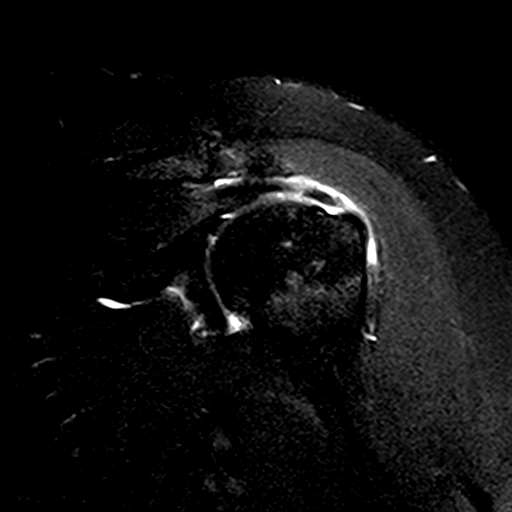
[im 10/17]
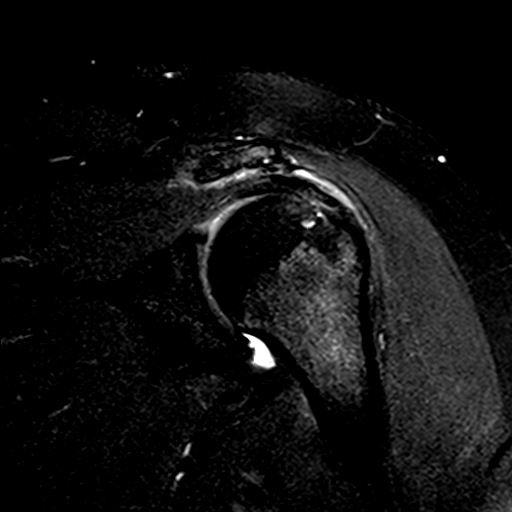
[im 12/17]
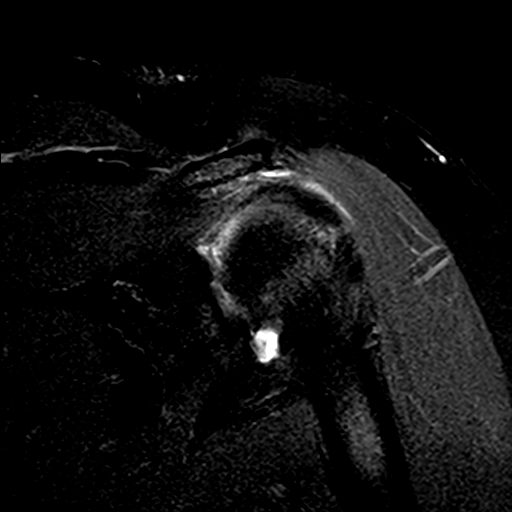
[im 14/17]
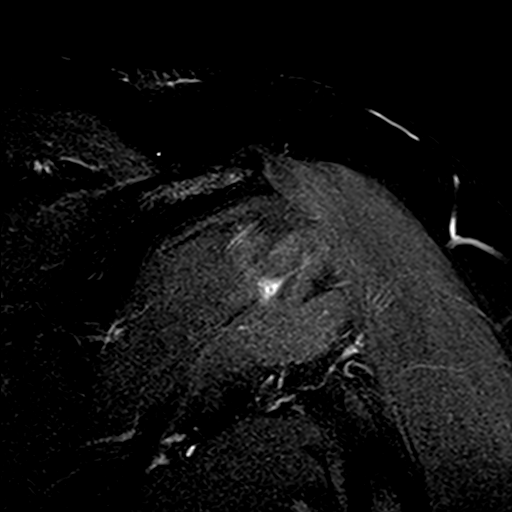
[im 17/17]
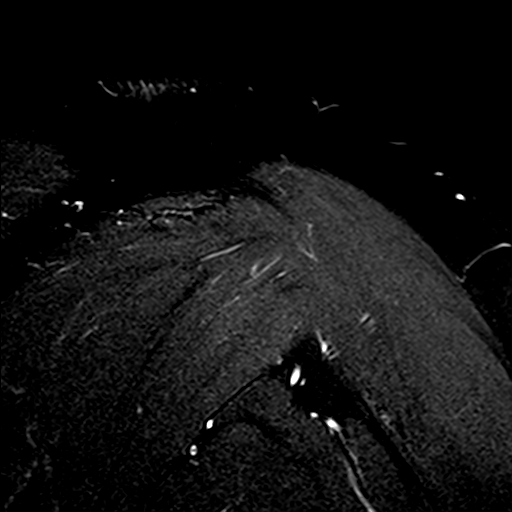

[Series 6: T1 · oblique · 4.0mm · 0.27mm/px · 3 of 18 slices shown]
[im 1/18]
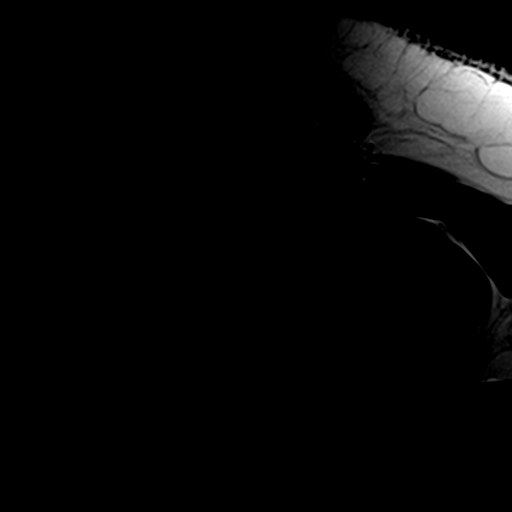
[im 3/18]
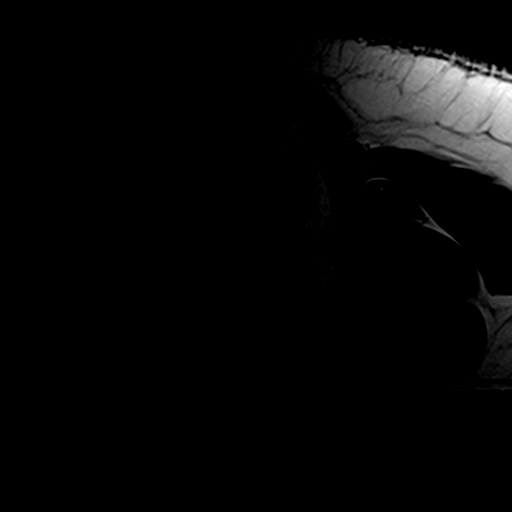
[im 5/18]
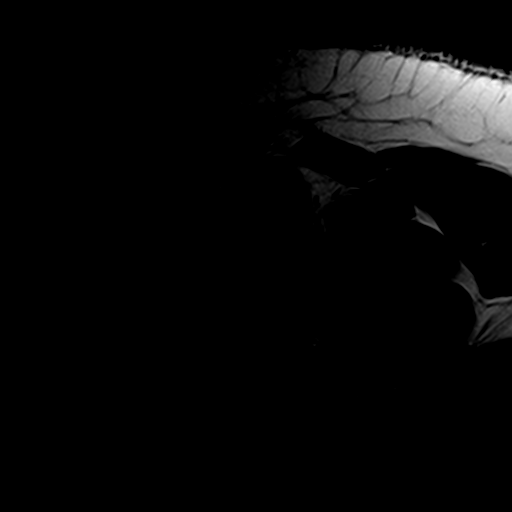

[Series 7: PD · coronal · 4.0mm · 0.55mm/px · 8 of 17 slices shown]
[im 1/17]
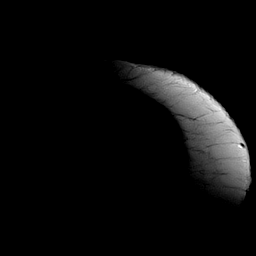
[im 3/17]
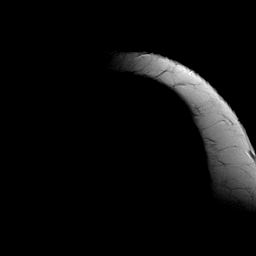
[im 5/17]
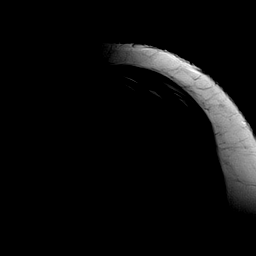
[im 7/17]
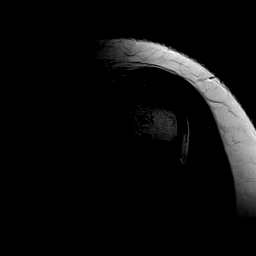
[im 10/17]
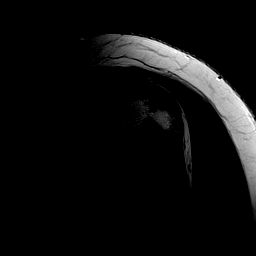
[im 12/17]
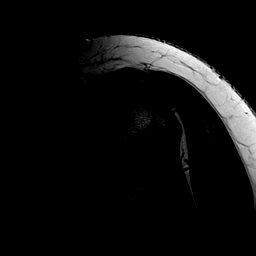
[im 14/17]
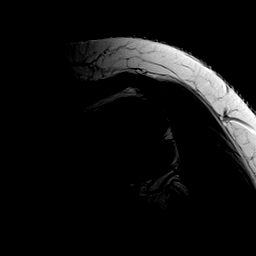
[im 17/17]
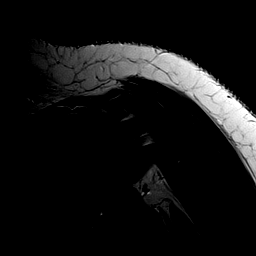

[35 of 40 positions shown; findings below may reference images not displayed]

FINDINGS: Rotator cuff: Small full-thickness tear involving the anterior
leading edge of the distal supraspinatus tendon is best seen on the
sagittal T2 weighted images. There is less than 1 cm of tendon
retraction. Moderate underlying supraspinatus tendinosis is present.
The additional components of the rotator cuff are intact. There is
mild subscapularis and infraspinous tendinosis.

Muscles:  No focal muscular atrophy or edema.

Biceps long head:  Intact and normally positioned.

Acromioclavicular Joint: The acromion is type 2. There are mild
acromioclavicular degenerative changes. There is a moderate amount
of fluid in the subacromial-subdeltoid bursa.

Glenohumeral Joint: Small shoulder joint effusion with fluid in the
superior subscapularis recess. There is no significant glenohumeral
arthropathy. No significant joint capsular thickening identified.

Labrum: The inferior glenohumeral ligament appears somewhat
redundant on the coronal images. The adjacent inferior labrum is
grossly intact. The superior labrum appears unremarkable.

Bones:  No significant extra-articular osseous findings.
IMPRESSION: 1. Small full thickness insertional tear of the supraspinatus tendon
anteriorly with minimal tendon retraction. No focal muscular
atrophy.
2. Mild supraspinatus and subscapularis tendinosis.
3. No acute osseous findings or evidence of labral tear.

## 2015-06-18 DIAGNOSIS — M75122 Complete rotator cuff tear or rupture of left shoulder, not specified as traumatic: Secondary | ICD-10-CM | POA: Diagnosis not present

## 2015-06-28 DIAGNOSIS — M6281 Muscle weakness (generalized): Secondary | ICD-10-CM | POA: Diagnosis not present

## 2015-06-28 DIAGNOSIS — M25512 Pain in left shoulder: Secondary | ICD-10-CM | POA: Diagnosis not present

## 2015-06-28 DIAGNOSIS — M75122 Complete rotator cuff tear or rupture of left shoulder, not specified as traumatic: Secondary | ICD-10-CM | POA: Diagnosis not present

## 2015-06-28 DIAGNOSIS — M25612 Stiffness of left shoulder, not elsewhere classified: Secondary | ICD-10-CM | POA: Diagnosis not present

## 2015-07-04 DIAGNOSIS — M25612 Stiffness of left shoulder, not elsewhere classified: Secondary | ICD-10-CM | POA: Diagnosis not present

## 2015-07-04 DIAGNOSIS — M6281 Muscle weakness (generalized): Secondary | ICD-10-CM | POA: Diagnosis not present

## 2015-07-04 DIAGNOSIS — M25512 Pain in left shoulder: Secondary | ICD-10-CM | POA: Diagnosis not present

## 2015-07-04 DIAGNOSIS — M75122 Complete rotator cuff tear or rupture of left shoulder, not specified as traumatic: Secondary | ICD-10-CM | POA: Diagnosis not present

## 2015-07-06 DIAGNOSIS — M25612 Stiffness of left shoulder, not elsewhere classified: Secondary | ICD-10-CM | POA: Diagnosis not present

## 2015-07-06 DIAGNOSIS — M75122 Complete rotator cuff tear or rupture of left shoulder, not specified as traumatic: Secondary | ICD-10-CM | POA: Diagnosis not present

## 2015-07-06 DIAGNOSIS — M6281 Muscle weakness (generalized): Secondary | ICD-10-CM | POA: Diagnosis not present

## 2015-07-06 DIAGNOSIS — M25512 Pain in left shoulder: Secondary | ICD-10-CM | POA: Diagnosis not present

## 2015-07-09 DIAGNOSIS — M75122 Complete rotator cuff tear or rupture of left shoulder, not specified as traumatic: Secondary | ICD-10-CM | POA: Diagnosis not present

## 2015-07-09 DIAGNOSIS — M25612 Stiffness of left shoulder, not elsewhere classified: Secondary | ICD-10-CM | POA: Diagnosis not present

## 2015-07-09 DIAGNOSIS — M6281 Muscle weakness (generalized): Secondary | ICD-10-CM | POA: Diagnosis not present

## 2015-07-09 DIAGNOSIS — M25512 Pain in left shoulder: Secondary | ICD-10-CM | POA: Diagnosis not present

## 2015-07-16 DIAGNOSIS — M25512 Pain in left shoulder: Secondary | ICD-10-CM | POA: Diagnosis not present

## 2015-07-16 DIAGNOSIS — M75122 Complete rotator cuff tear or rupture of left shoulder, not specified as traumatic: Secondary | ICD-10-CM | POA: Diagnosis not present

## 2015-07-16 DIAGNOSIS — M6281 Muscle weakness (generalized): Secondary | ICD-10-CM | POA: Diagnosis not present

## 2015-07-16 DIAGNOSIS — M25612 Stiffness of left shoulder, not elsewhere classified: Secondary | ICD-10-CM | POA: Diagnosis not present

## 2015-07-18 DIAGNOSIS — M6281 Muscle weakness (generalized): Secondary | ICD-10-CM | POA: Diagnosis not present

## 2015-07-18 DIAGNOSIS — M25612 Stiffness of left shoulder, not elsewhere classified: Secondary | ICD-10-CM | POA: Diagnosis not present

## 2015-07-18 DIAGNOSIS — M75122 Complete rotator cuff tear or rupture of left shoulder, not specified as traumatic: Secondary | ICD-10-CM | POA: Diagnosis not present

## 2015-07-18 DIAGNOSIS — M25512 Pain in left shoulder: Secondary | ICD-10-CM | POA: Diagnosis not present

## 2015-07-26 ENCOUNTER — Emergency Department (HOSPITAL_COMMUNITY)
Admission: EM | Admit: 2015-07-26 | Discharge: 2015-07-26 | Discharge status: Against Medical Advice | Payer: Medicare Other | Attending: Emergency Medicine | Admitting: Emergency Medicine

## 2015-07-26 ENCOUNTER — Encounter (HOSPITAL_COMMUNITY): Payer: Self-pay | Admitting: *Deleted

## 2015-07-26 DIAGNOSIS — E119 Type 2 diabetes mellitus without complications: Secondary | ICD-10-CM | POA: Diagnosis not present

## 2015-07-26 DIAGNOSIS — G43909 Migraine, unspecified, not intractable, without status migrainosus: Secondary | ICD-10-CM | POA: Insufficient documentation

## 2015-07-26 DIAGNOSIS — I10 Essential (primary) hypertension: Secondary | ICD-10-CM | POA: Diagnosis not present

## 2015-07-26 DIAGNOSIS — G8929 Other chronic pain: Secondary | ICD-10-CM | POA: Diagnosis not present

## 2015-07-26 DIAGNOSIS — J45909 Unspecified asthma, uncomplicated: Secondary | ICD-10-CM | POA: Insufficient documentation

## 2015-07-26 DIAGNOSIS — R110 Nausea: Secondary | ICD-10-CM | POA: Diagnosis not present

## 2015-07-26 MED ORDER — ONDANSETRON 4 MG PO TBDP
4.0000 mg | ORAL_TABLET | Freq: Once | ORAL | Status: AC | PRN
  Administered 2015-07-26: 4 mg via ORAL

## 2015-07-26 MED ORDER — OXYCODONE-ACETAMINOPHEN 5-325 MG PO TABS
1.0000 | ORAL_TABLET | Freq: Once | ORAL | Status: AC
  Administered 2015-07-26: 1 via ORAL

## 2015-07-26 MED ORDER — OXYCODONE-ACETAMINOPHEN 5-325 MG PO TABS
ORAL_TABLET | ORAL | Status: AC
  Filled 2015-07-26: qty 1

## 2015-07-26 MED ORDER — ONDANSETRON 4 MG PO TBDP
ORAL_TABLET | ORAL | Status: AC
  Filled 2015-07-26: qty 1

## 2015-07-26 NOTE — ED Notes (Signed)
Patient reports migraine x 5 days, patient reports hx of migraines. Patient with nausea and photophobia.

## 2015-07-26 NOTE — ED Notes (Signed)
Pt approached nurse first stating she would cant wait any longer because her husband has to leave to go to work. Pt informed of the risks of leaving without seeing doctor first but pt states she would still like to leave and would return if her headache worsens. Pt observed walking out of the ED with steady gait, NAD

## 2015-08-01 ENCOUNTER — Ambulatory Visit: Payer: Medicare Other | Attending: Internal Medicine | Admitting: Internal Medicine

## 2015-08-01 ENCOUNTER — Encounter: Payer: Self-pay | Admitting: Internal Medicine

## 2015-08-01 VITALS — BP 150/103 | HR 86 | Temp 98.0°F | Resp 16 | Wt 241.2 lb

## 2015-08-01 DIAGNOSIS — Z23 Encounter for immunization: Secondary | ICD-10-CM

## 2015-08-01 DIAGNOSIS — I10 Essential (primary) hypertension: Secondary | ICD-10-CM | POA: Diagnosis not present

## 2015-08-01 DIAGNOSIS — E119 Type 2 diabetes mellitus without complications: Secondary | ICD-10-CM | POA: Diagnosis not present

## 2015-08-01 DIAGNOSIS — Z794 Long term (current) use of insulin: Secondary | ICD-10-CM | POA: Diagnosis not present

## 2015-08-01 DIAGNOSIS — L309 Dermatitis, unspecified: Secondary | ICD-10-CM | POA: Diagnosis not present

## 2015-08-01 LAB — BASIC METABOLIC PANEL
BUN: 17 mg/dL (ref 7–25)
CALCIUM: 9.2 mg/dL (ref 8.6–10.2)
CO2: 25 mmol/L (ref 20–31)
CREATININE: 0.93 mg/dL (ref 0.50–1.10)
Chloride: 106 mmol/L (ref 98–110)
Glucose, Bld: 150 mg/dL — ABNORMAL HIGH (ref 65–99)
Potassium: 3.9 mmol/L (ref 3.5–5.3)
Sodium: 141 mmol/L (ref 135–146)

## 2015-08-01 LAB — POCT GLYCOSYLATED HEMOGLOBIN (HGB A1C): HEMOGLOBIN A1C: 8.7

## 2015-08-01 LAB — GLUCOSE, POCT (MANUAL RESULT ENTRY): POC GLUCOSE: 212 mg/dL — AB (ref 70–99)

## 2015-08-01 MED ORDER — DAPAGLIFLOZIN PROPANEDIOL 5 MG PO TABS: 5.0000 mg | ORAL_TABLET | Freq: Every day | ORAL | Status: DC

## 2015-08-01 MED ORDER — AMLODIPINE BESYLATE 10 MG PO TABS: 10.0000 mg | ORAL_TABLET | Freq: Every day | ORAL | Status: DC

## 2015-08-01 MED ORDER — SIMVASTATIN 10 MG PO TABS: 10.0000 mg | ORAL_TABLET | Freq: Every day | ORAL | Status: DC

## 2015-08-01 MED ORDER — BETAMETHASONE VALERATE 0.1 % EX CREA: TOPICAL_CREAM | Freq: Two times a day (BID) | CUTANEOUS | Status: DC

## 2015-08-01 MED ORDER — INSULIN GLARGINE 100 UNIT/ML ~~LOC~~ SOLN: 20.0000 [IU] | Freq: Every day | SUBCUTANEOUS | Status: DC

## 2015-08-01 MED ORDER — LOSARTAN POTASSIUM 100 MG PO TABS: 100.0000 mg | ORAL_TABLET | Freq: Every day | ORAL | Status: DC

## 2015-08-01 MED ORDER — HYDROCHLOROTHIAZIDE 25 MG PO TABS: 25.0000 mg | ORAL_TABLET | Freq: Every day | ORAL | Status: DC

## 2015-08-01 NOTE — Progress Notes (Signed)
Patient here for follow up on her HTN and diabetes Patient presents in office with elevated blood pressure Patient stated she did take her medication today Patient also complains of having some dry skin patches on her hands and feet

## 2015-08-01 NOTE — Progress Notes (Signed)
Patient ID: [name], female   DOB: January 19, 1971, 43 y.o.   MRN: [id number] SUBJECTIVE: 44 y.o. female for follow up of diabetes and hypertension.  Patient reports that she has been taking her medications as scheduled and took her blood pressure medication this morning.  Diabetic Review of Systems - medication compliance: compliant most of the time, diabetic diet compliance: noncompliant some of the time, home glucose monitoring: is performed sporadically.  Other symptoms and concerns: Still having headaches but is unable to see neurology until March. Had left rotator cuff surgery 2 months ago and is still having pain.   Current Outpatient Prescriptions  Medication Sig Dispense Refill  . albuterol (PROVENTIL HFA;VENTOLIN HFA) 108 (90 BASE) MCG/ACT inhaler Inhale 2 puffs into the lungs every 6 (six) hours as needed for wheezing or shortness of breath.    Marland Kitchen albuterol (PROVENTIL) (2.5 MG/3ML) 0.083% nebulizer solution Take 2.5 mg by nebulization every 6 (six) hours as needed for wheezing or shortness of breath.    Marland Kitchen amLODipine (NORVASC) 10 MG tablet Take 10 mg by mouth daily.    Marland Kitchen aspirin EC 325 MG tablet Take 325 mg by mouth daily.    . carvedilol (COREG) 3.125 MG tablet TAKE 1 TABLET BY MOUTH 2 TIMES DAILY WITH A MEAL. 60 tablet 5  . cyclobenzaprine (FLEXERIL) 5 MG tablet TAKE 1 TABLET BY MOUTH 3 TIMES DAILY AS NEEDED FOR MUSCLE SPASMS. 90 tablet 2  . dapagliflozin propanediol (FARXIGA) 5 MG TABS tablet Take 5 mg by mouth daily. 30 tablet 4  . Fluticasone-Salmeterol (ADVAIR) 250-50 MCG/DOSE AEPB Inhale 1 puff into the lungs 2 times daily at 12 noon and 4 pm. (Patient taking differently: Inhale 1 puff into the lungs daily as needed (shortness of breath). ) 60 each 0  . gabapentin (NEURONTIN) 300 MG capsule Take 1 capsule (300 mg total) by mouth 3 (three) times daily. 90 capsule 3  . hydrochlorothiazide (HYDRODIURIL) 25 MG tablet Take 1 tablet (25 mg total) by mouth daily. 30 tablet 4  .  insulin glargine (LANTUS) 100 UNIT/ML injection Inject 0.18 mLs (18 Units total) into the skin at bedtime. 10 mL 11  . ipratropium (ATROVENT) 0.02 % nebulizer solution Take 0.5 mg by nebulization every 6 (six) hours as needed for wheezing or shortness of breath.     . losartan (COZAAR) 50 MG tablet Take 1 tablet (50 mg total) by mouth daily. For Blood pressure 90 tablet 3  . meloxicam (MOBIC) 7.5 MG tablet Take 7.5 mg by mouth daily.    . Oxycodone-Acetaminophen (PERCOCET PO) Take by mouth.    . sertraline (ZOLOFT) 50 MG tablet Take 1 tablet (50 mg total) by mouth daily. 30 tablet 2  . simvastatin (ZOCOR) 10 MG tablet Take 10 mg by mouth daily at 6 PM.     . SUMAtriptan (IMITREX) 100 MG tablet TAKE 1TAB AT EARLIEST ONSET OF HEADACHE. MAY REPEAT X1 IN 2 HOURS IF HEADACHE PERSISTS OR RECURS. DO NOT EXCEED 2 TABS IN 24HRS 9 tablet 0  . Aspirin-Acetaminophen-Caffeine (GOODY HEADACHE PO) Take 1 Package by mouth daily as needed (for leg pain).    . traMADol (ULTRAM) 50 MG tablet Take 50 mg by mouth every 6 (six) hours as needed for moderate pain or severe pain.    Marland Kitchen triamcinolone cream (KENALOG) 0.1 % Apply 1 application topically 2 (two) times daily. 30 g 0  . [DISCONTINUED] promethazine (PHENERGAN) 25 MG tablet Take 1 tablet (25 mg total) by mouth every 6 (six)  hours as needed for nausea. (Patient not taking: Reported on 12/13/2014) 20 tablet 0   No current facility-administered medications for this visit.    OBJECTIVE: Appearance: alert, well appearing, and in no distress, oriented to person, place, and time and overweight. BP 150/103 mmHg  Pulse 86  Temp(Src) 98 F (36.7 C)  Resp 16  Wt 241 lb 3.2 oz (109.408 kg)  SpO2 100%  Exam: heart sounds normal rate, regular rhythm, normal S1, S2, no murmurs, rubs, clicks or gallops, no JVD, chest clear, no carotid bruits, feet: warm, good capillary refill, no trophic changes or ulcerative lesions, normal DP and PT pulses, normal monofilament exam and  normal sensory exam  ASSESSMENT: [name] was seen today for follow-up.  Diagnoses and all orders for this visit:  Type 2 diabetes mellitus without complication, with long-term current use of insulin (HCC) -     Glucose (CBG) -     HgB A1c -     Ambulatory referral to Endocrinology -     insulin glargine (LANTUS) 100 UNIT/ML injection; Inject 0.2 mLs (20 Units total) into the skin at bedtime. -     simvastatin (ZOCOR) 10 MG tablet; Take 1 tablet (10 mg total) by mouth daily at 6 PM. -     dapagliflozin propanediol (FARXIGA) 5 MG TABS tablet; Take 5 mg by mouth daily. -     Basic Metabolic Panel Since I have been having a hard time getting patient to come back for review and medication changes I will refer her to Endocrinology. She may be a great candidate for victoza to help manage weight as well.   Essential hypertension, benign -     losartan (COZAAR) 100 MG tablet; Take 1 tablet (100 mg total) by mouth daily. For Blood pressure -     amLODipine (NORVASC) 10 MG tablet; Take 1 tablet (10 mg total) by mouth daily. -     hydrochlorothiazide (HYDRODIURIL) 25 MG tablet; Take 1 tablet (25 mg total) by mouth daily. I have increased her Losartan to 100 mg daily to help get better control over her pressures. She will return in 2 weeks for a BP check.  Dermatitis -     betamethasone valerate (VALISONE) 0.1 % cream; Apply topically 2 (two) times daily.  Need for influenza vaccination -     Flu Vaccine QUAD 36+ mos PF IM (Fluarix & Fluzone Quad PF)  Return in about 1 week (around 08/08/2015) for BP recheck with Stacy and 3 mo PCP .  Lance Bosch, NP 08/05/2015 10:20 PM

## 2015-08-01 NOTE — Patient Instructions (Signed)
I have increased your dose of Losartan to 100 mg daily. Begin taking this tomorrow and I will have you come back the end of next week for a BP recheck to see if that made any difference.   I have placed referral to Endocrinology---diabetes specialist. They will call you soon for a appointment. If you have not heard from them in 1 week call the office back to make sure it went through.

## 2015-08-02 DIAGNOSIS — M6281 Muscle weakness (generalized): Secondary | ICD-10-CM | POA: Diagnosis not present

## 2015-08-02 DIAGNOSIS — M25612 Stiffness of left shoulder, not elsewhere classified: Secondary | ICD-10-CM | POA: Diagnosis not present

## 2015-08-02 DIAGNOSIS — M25512 Pain in left shoulder: Secondary | ICD-10-CM | POA: Diagnosis not present

## 2015-08-02 DIAGNOSIS — M75122 Complete rotator cuff tear or rupture of left shoulder, not specified as traumatic: Secondary | ICD-10-CM | POA: Diagnosis not present

## 2015-08-07 ENCOUNTER — Ambulatory Visit: Payer: Medicare Other | Admitting: Endocrinology

## 2015-08-08 ENCOUNTER — Ambulatory Visit: Payer: Medicare Other | Attending: Internal Medicine | Admitting: Pharmacist

## 2015-08-08 VITALS — BP 132/78 | HR 80

## 2015-08-08 DIAGNOSIS — I10 Essential (primary) hypertension: Secondary | ICD-10-CM

## 2015-08-08 DIAGNOSIS — Z79899 Other long term (current) drug therapy: Secondary | ICD-10-CM | POA: Insufficient documentation

## 2015-08-08 NOTE — Progress Notes (Signed)
S:    Patient arrives in good spirits with her husband.  Presents to the clinic for hypertension evaluation.   Patient reports adherence with medications. She took her blood pressure mediations this morning.   Current BP Medications include:  Amlodipine 10 mg daily, carvedilol 3.125 mg BID, HCTZ 25 mg daily, and losartan 100 mg daily.    O:   Last 3 Office BP readings: BP Readings from Last 3 Encounters:  08/08/15 132/78  08/01/15 150/103  07/26/15 144/87    BMET    Component Value Date/Time   NA 141 08/01/2015 1202   K 3.9 08/01/2015 1202   CL 106 08/01/2015 1202   CO2 25 08/01/2015 1202   GLUCOSE 150* 08/01/2015 1202   BUN 17 08/01/2015 1202   CREATININE 0.93 08/01/2015 1202   CREATININE 0.90 05/03/2015 1943   CALCIUM 9.2 08/01/2015 1202   GFRNONAA >60 05/03/2015 1734   GFRAA >60 05/03/2015 1734    A/P: History of hypertension currently controlled on current medications.  No changes at this time - patient to continue all medications as prescribed.   Results reviewed and written information provided.   Total time in face-to-face counseling 10 minutes.   F/U Clinic Visit with Chari Manning, NP, as directed.  Patient seen with Blain Pais, nursing student.

## 2015-08-08 NOTE — Patient Instructions (Signed)
Thanks for coming to see me today!  Your blood pressure looks great!  Keep taking all of your medications as directed and follow up with Chari Manning

## 2015-08-09 DIAGNOSIS — M25512 Pain in left shoulder: Secondary | ICD-10-CM | POA: Diagnosis not present

## 2015-08-09 DIAGNOSIS — M25612 Stiffness of left shoulder, not elsewhere classified: Secondary | ICD-10-CM | POA: Diagnosis not present

## 2015-08-09 DIAGNOSIS — M75122 Complete rotator cuff tear or rupture of left shoulder, not specified as traumatic: Secondary | ICD-10-CM | POA: Diagnosis not present

## 2015-08-09 DIAGNOSIS — M6281 Muscle weakness (generalized): Secondary | ICD-10-CM | POA: Diagnosis not present

## 2015-08-10 DIAGNOSIS — M25512 Pain in left shoulder: Secondary | ICD-10-CM | POA: Diagnosis not present

## 2015-08-10 DIAGNOSIS — M25612 Stiffness of left shoulder, not elsewhere classified: Secondary | ICD-10-CM | POA: Diagnosis not present

## 2015-08-10 DIAGNOSIS — M6281 Muscle weakness (generalized): Secondary | ICD-10-CM | POA: Diagnosis not present

## 2015-08-10 DIAGNOSIS — M75122 Complete rotator cuff tear or rupture of left shoulder, not specified as traumatic: Secondary | ICD-10-CM | POA: Diagnosis not present

## 2015-08-13 ENCOUNTER — Encounter: Payer: Self-pay | Admitting: Endocrinology

## 2015-08-13 ENCOUNTER — Ambulatory Visit (INDEPENDENT_AMBULATORY_CARE_PROVIDER_SITE_OTHER): Payer: Medicare Other | Admitting: Endocrinology

## 2015-08-13 VITALS — BP 134/86 | HR 120 | Temp 98.7°F | Ht 61.0 in | Wt 237.0 lb

## 2015-08-13 DIAGNOSIS — E119 Type 2 diabetes mellitus without complications: Secondary | ICD-10-CM | POA: Diagnosis not present

## 2015-08-13 DIAGNOSIS — Z794 Long term (current) use of insulin: Secondary | ICD-10-CM | POA: Diagnosis not present

## 2015-08-13 MED ORDER — DAPAGLIFLOZIN PROPANEDIOL 5 MG PO TABS
5.0000 mg | ORAL_TABLET | Freq: Every day | ORAL | Status: DC
Start: 2015-08-13 — End: 2016-01-25

## 2015-08-13 MED ORDER — INSULIN GLARGINE 100 UNIT/ML ~~LOC~~ SOLN: 30.0000 [IU] | SUBCUTANEOUS | Status: DC

## 2015-08-13 MED ORDER — DULAGLUTIDE 0.75 MG/0.5ML ~~LOC~~ SOAJ: 0.7500 mg | SUBCUTANEOUS | Status: DC

## 2015-08-13 NOTE — Patient Instructions (Addendum)
good diet and exercise significantly improve the control of your diabetes.  please let me know if you wish to be referred to a dietician.  high blood sugar is very risky to your health.  you should see an eye doctor and dentist every year.  It is very important to get all recommended vaccinations.  controlling your blood pressure and cholesterol drastically reduces the damage diabetes does to your body.  Those who smoke should quit.  please discuss these with your doctor.  check your blood sugar twice a day.  vary the time of day when you check, between before the 3 meals, and at bedtime.  also check if you have symptoms of your blood sugar being too high or too low.  please keep a record of the readings and bring it to your next appointment here (or you can bring the meter itself).  You can write it on any piece of paper.  please call us sooner if your blood sugar goes below 70, or if you have a lot of readings over 200.   At our office, we are fortunate to have two specialists who are happy to help you:   Leonia Reader, RN, CDE, is a diabetes educator.  She is here on Monday mornings, and all day Tuesday and Wednesday.  She is can help you with low blood sugar avoidance and treatment, injecting insulin, sick day management, and others.   Antonieta Iba, RD is our dietician.  She is here all day Thursday and Friday.  She can advise you about a healthy diet.  She can also help you about a variety of special diabetes situations, such as shift work, Actor, gluten-free, diet for kidney patients, traveling with diabetes, and help for those who need to gain weight.   For now: Please continue the same farxiga, and:  Add "trulicity" (i have sent a prescription to your pharmacy), and: Increase the lantus to 30 units daily (it is better to take in the morning).  Please come back for a follow-up appointment in 2-3 weeks.

## 2015-08-13 NOTE — Progress Notes (Signed)
Subjective:    Patient ID: [name], female    DOB: [birth year], 44 y.o.   MRN: [id number]  HPI pt states DM was dx'ed in 2007; she has severe painful neuropathy of the lower extremities; she is unaware of any associated chronic complications; she has been on insulin since 2012; pt says her diet and exercise are not good; she has never had GDM, pancreatitis, severe hypoglycemia or DKA.  She takes lantus 20 units qd.  She says cbg's are chronically in the 200's.  Pt says she was advised to add trulicity.   Past Medical History  Diagnosis Date  . Hypertension   . Hypokalemia   . Asthma   . DM (diabetes mellitus) ([place])     INSULIN DEPENDENT  . Depression   . GERD (gastroesophageal reflux disease)   . Headache(784.0)   . Chronic back pain   . Chronic chest pain     Past Surgical History  Procedure Laterality Date  . Cesarean section      x 3  . Appendectomy    . Vesicovaginal fistula closure w/ tah  2009  . Hernia repair    . Cholecystectomy N/A 03/02/2014    Procedure: LAPAROSCOPIC CHOLECYSTECTOMY;  Surgeon: Joyice Faster. Cornett, MD;  Location: [facility];  Service: General;  Laterality: N/A;  . Rotator cuff repair      Social History   Social History  . Marital Status: Married    Spouse Name: N/A  . Number of Children: N/A  . Years of Education: N/A   Occupational History  . Not on file.   Social History Main Topics  . Smoking status: Former Smoker -- 0.30 packs/day for 22 years    Types: Cigarettes    Quit date: 05/01/2011  . Smokeless tobacco: Never Used  . Alcohol Use: 0.0 oz/week    0 Standard drinks or equivalent per week     Comment: rare  . Drug Use: No  . Sexual Activity:    Partners: Male   Other Topics Concern  . Not on file   Social History Narrative    Current Outpatient Prescriptions on File Prior to Visit  Medication Sig Dispense Refill  . albuterol (PROVENTIL HFA;VENTOLIN HFA) 108 (90 BASE) MCG/ACT inhaler Inhale 2 puffs into the  lungs every 6 (six) hours as needed for wheezing or shortness of breath.    Marland Kitchen albuterol (PROVENTIL) (2.5 MG/3ML) 0.083% nebulizer solution Take 2.5 mg by nebulization every 6 (six) hours as needed for wheezing or shortness of breath.    Marland Kitchen amLODipine (NORVASC) 10 MG tablet Take 1 tablet (10 mg total) by mouth daily. 30 tablet 4  . aspirin EC 325 MG tablet Take 325 mg by mouth daily.    . Aspirin-Acetaminophen-Caffeine (GOODY HEADACHE PO) Take 1 Package by mouth daily as needed (for leg pain).    Marland Kitchen betamethasone valerate (VALISONE) 0.1 % cream Apply topically 2 (two) times daily. 30 g 0  . carvedilol (COREG) 3.125 MG tablet TAKE 1 TABLET BY MOUTH 2 TIMES DAILY WITH A MEAL. 60 tablet 5  . cyclobenzaprine (FLEXERIL) 5 MG tablet TAKE 1 TABLET BY MOUTH 3 TIMES DAILY AS NEEDED FOR MUSCLE SPASMS. 90 tablet 2  . Fluticasone-Salmeterol (ADVAIR) 250-50 MCG/DOSE AEPB Inhale 1 puff into the lungs 2 times daily at 12 noon and 4 pm. (Patient taking differently: Inhale 1 puff into the lungs daily as needed (shortness of breath). ) 60 each 0  . gabapentin (NEURONTIN) 300 MG capsule Take  1 capsule (300 mg total) by mouth 3 (three) times daily. 90 capsule 3  . hydrochlorothiazide (HYDRODIURIL) 25 MG tablet Take 1 tablet (25 mg total) by mouth daily. 30 tablet 4  . ipratropium (ATROVENT) 0.02 % nebulizer solution Take 0.5 mg by nebulization every 6 (six) hours as needed for wheezing or shortness of breath.     . losartan (COZAAR) 100 MG tablet Take 1 tablet (100 mg total) by mouth daily. For Blood pressure 30 tablet 4  . meloxicam (MOBIC) 7.5 MG tablet Take 7.5 mg by mouth daily.    . sertraline (ZOLOFT) 50 MG tablet Take 1 tablet (50 mg total) by mouth daily. 30 tablet 2  . simvastatin (ZOCOR) 10 MG tablet Take 1 tablet (10 mg total) by mouth daily at 6 PM. 30 tablet 6  . SUMAtriptan (IMITREX) 100 MG tablet TAKE 1TAB AT EARLIEST ONSET OF HEADACHE. MAY REPEAT X1 IN 2 HOURS IF HEADACHE PERSISTS OR RECURS. DO NOT EXCEED 2  TABS IN 24HRS 9 tablet 0  . traMADol (ULTRAM) 50 MG tablet Take 50 mg by mouth every 6 (six) hours as needed for moderate pain or severe pain.    . [DISCONTINUED] promethazine (PHENERGAN) 25 MG tablet Take 1 tablet (25 mg total) by mouth every 6 (six) hours as needed for nausea. (Patient not taking: Reported on 12/13/2014) 20 tablet 0   No current facility-administered medications on file prior to visit.    Allergies  Allergen Reactions  . Reglan [Metoclopramide] Other (See Comments)    Panic attack    Family History  Problem Relation Age of Onset  . Heart attack Mother   . Stroke Mother   . Diabetes Mother   . Hypertension Mother   . Arthritis Mother   . Stroke Father   . Hypertension Sister   . Diabetes Sister   . Seizures Brother   . Diabetes Brother     BP 134/86 mmHg  Pulse 120  Temp(Src) 98.7 F (37.1 C) (Oral)  Ht 5\' 1"  (1.549 m)  Wt 237 lb (107.502 kg)  BMI 44.80 kg/m2  SpO2 97%  Review of Systems denies chest pain, n/v, and cold intolerance.  she has weight gain, frequent urination, leg cramps, depression, chronic headache, excessive diaphoresis, rhinorrhea, easy bruising, and chronically blurry vision.  Sob is improved recently.      Objective:   Physical Exam VS: see vs page GEN: no distress HEAD: head: no deformity eyes: no periorbital swelling, no proptosis external nose and ears are normal mouth: no lesion seen NECK: supple, thyroid is not enlarged CHEST WALL: no deformity LUNGS:  Clear to auscultation CV: reg rate and rhythm, no murmur ABD: abdomen is soft, nontender.  no hepatosplenomegaly.  not distended.  no hernia MUSCULOSKELETAL: muscle bulk and strength are grossly normal.  no obvious joint swelling.  gait is normal and steady EXTEMITIES: no deformity.  no ulcer on the feet.  feet are of normal color and temp.  no edema PULSES: dorsalis pedis intact bilat.  no carotid bruit NEURO:  cn 2-12 grossly intact.   readily moves all 4's.  sensation  is intact to touch on the feet SKIN:  Normal texture and temperature.  No rash or suspicious lesion is visible.   NODES:  None palpable at the neck.   PSYCH: alert, well-oriented.  Does not appear anxious nor depressed.   Lab Results  Component Value Date   HGBA1C 8.70 08/01/2015   i personally reviewed electrocardiogram tracing (05/04/15): Indication: chest pain  Impression: normal.    Assessment & Plan:  Type 2 DM: new to me, she needs increased rx   Patient is advised the following: Patient Instructions  good diet and exercise significantly improve the control of your diabetes.  please let me know if you wish to be referred to a dietician.  high blood sugar is very risky to your health.  you should see an eye doctor and dentist every year.  It is very important to get all recommended vaccinations.  controlling your blood pressure and cholesterol drastically reduces the damage diabetes does to your body.  Those who smoke should quit.  please discuss these with your doctor.  check your blood sugar twice a day.  vary the time of day when you check, between before the 3 meals, and at bedtime.  also check if you have symptoms of your blood sugar being too high or too low.  please keep a record of the readings and bring it to your next appointment here (or you can bring the meter itself).  You can write it on any piece of paper.  please call us sooner if your blood sugar goes below 70, or if you have a lot of readings over 200.   At our office, we are fortunate to have two specialists who are happy to help you:   Leonia Reader, RN, CDE, is a diabetes educator.  She is here on Monday mornings, and all day Tuesday and Wednesday.  She is can help you with low blood sugar avoidance and treatment, injecting insulin, sick day management, and others.   Antonieta Iba, RD is our dietician.  She is here all day Thursday and Friday.  She can advise you about a healthy diet.  She can also help you about a  variety of special diabetes situations, such as shift work, Actor, gluten-free, diet for kidney patients, traveling with diabetes, and help for those who need to gain weight.   For now: Please continue the same farxiga, and:  Add "trulicity" (i have sent a prescription to your pharmacy), and: Increase the lantus to 30 units daily (it is better to take in the morning).  Please come back for a follow-up appointment in 2-3 weeks.

## 2015-08-14 ENCOUNTER — Telehealth: Payer: Self-pay

## 2015-08-14 NOTE — Telephone Encounter (Signed)
Patient not available Left message on voice mail to return our call 

## 2015-08-14 NOTE — Telephone Encounter (Signed)
-----   Message from Lance Bosch, NP sent at 08/13/2015  5:01 PM EST ----- Labs are within normal limits

## 2015-08-20 ENCOUNTER — Telehealth: Payer: Self-pay | Admitting: Endocrinology

## 2015-08-20 DIAGNOSIS — M6281 Muscle weakness (generalized): Secondary | ICD-10-CM | POA: Diagnosis not present

## 2015-08-20 DIAGNOSIS — M25512 Pain in left shoulder: Secondary | ICD-10-CM | POA: Diagnosis not present

## 2015-08-20 DIAGNOSIS — M75122 Complete rotator cuff tear or rupture of left shoulder, not specified as traumatic: Secondary | ICD-10-CM | POA: Diagnosis not present

## 2015-08-20 DIAGNOSIS — M25612 Stiffness of left shoulder, not elsewhere classified: Secondary | ICD-10-CM | POA: Diagnosis not present

## 2015-08-20 MED ORDER — GLUCOSE BLOOD VI STRP: ORAL_STRIP | Status: DC

## 2015-08-20 NOTE — Telephone Encounter (Signed)
Blood sugars are still above 200, please call back to get the readings

## 2015-08-20 NOTE — Telephone Encounter (Signed)
I contacted the pt and advised of new instructions stated below. Pt voiced understanding.

## 2015-08-20 NOTE — Telephone Encounter (Signed)
Please increase the lantus to 40 units qam i'll see you next time.

## 2015-08-20 NOTE — Telephone Encounter (Signed)
Patient called to report blood sugar readings.  11/21: Bedtime: 294  11/22: Fasting: 225 Lunch: 206 Bedtime: 299  11/23: Fasting: 292 Lunch: 348 Bedtime: 254  11/24: Fasting: 278 Bedtime: 398  11/25: Fasting: 240 Bedtime:309  11/26:  Fasting: 281 Lunch: 250 Bedtime: 240  11/27:  Fasting: 275 Bedtime: 208  11/28: Fasting: 277.  Patient is currently taking 30 units of lantus daily.  Please advise, Thanks!

## 2015-08-21 ENCOUNTER — Other Ambulatory Visit: Payer: Self-pay

## 2015-08-21 ENCOUNTER — Telehealth: Payer: Self-pay | Admitting: Endocrinology

## 2015-08-21 MED ORDER — FREESTYLE LANCETS MISC: Status: DC

## 2015-08-21 MED ORDER — GLUCOSE BLOOD VI STRP: ORAL_STRIP | Status: DC

## 2015-08-21 MED ORDER — FREESTYLE LITE DEVI: Status: DC

## 2015-08-21 NOTE — Telephone Encounter (Signed)
I contacted the pt and advised we have changed her DM testing supplies to [employer] [facility]. Pt voiced understanding.

## 2015-08-21 NOTE — Telephone Encounter (Signed)
Patient called stating that she would like a new meter and test strips called in to her pharmacy  Insurance will not approve the current test strips    Pharmacy: [facility] at [facility]   Please advise   Thank you

## 2015-08-22 ENCOUNTER — Telehealth: Payer: Self-pay | Admitting: Endocrinology

## 2015-08-22 ENCOUNTER — Emergency Department (HOSPITAL_COMMUNITY)
Admission: EM | Admit: 2015-08-22 | Discharge: 2015-08-22 | Disposition: A | Discharge status: Home / Self Care | Payer: Medicare Other | Attending: Emergency Medicine | Admitting: Emergency Medicine

## 2015-08-22 ENCOUNTER — Encounter (HOSPITAL_COMMUNITY): Payer: Self-pay | Admitting: Neurology

## 2015-08-22 ENCOUNTER — Emergency Department (HOSPITAL_COMMUNITY): Payer: Medicare Other

## 2015-08-22 DIAGNOSIS — M791 Myalgia, unspecified site: Secondary | ICD-10-CM

## 2015-08-22 DIAGNOSIS — R630 Anorexia: Secondary | ICD-10-CM | POA: Insufficient documentation

## 2015-08-22 DIAGNOSIS — I10 Essential (primary) hypertension: Secondary | ICD-10-CM | POA: Insufficient documentation

## 2015-08-22 DIAGNOSIS — Z7952 Long term (current) use of systemic steroids: Secondary | ICD-10-CM | POA: Diagnosis not present

## 2015-08-22 DIAGNOSIS — F329 Major depressive disorder, single episode, unspecified: Secondary | ICD-10-CM | POA: Insufficient documentation

## 2015-08-22 DIAGNOSIS — Z7982 Long term (current) use of aspirin: Secondary | ICD-10-CM | POA: Insufficient documentation

## 2015-08-22 DIAGNOSIS — E119 Type 2 diabetes mellitus without complications: Secondary | ICD-10-CM | POA: Diagnosis not present

## 2015-08-22 DIAGNOSIS — Z791 Long term (current) use of non-steroidal anti-inflammatories (NSAID): Secondary | ICD-10-CM | POA: Diagnosis not present

## 2015-08-22 DIAGNOSIS — M1990 Unspecified osteoarthritis, unspecified site: Secondary | ICD-10-CM | POA: Diagnosis not present

## 2015-08-22 DIAGNOSIS — J45909 Unspecified asthma, uncomplicated: Secondary | ICD-10-CM | POA: Diagnosis not present

## 2015-08-22 DIAGNOSIS — R05 Cough: Secondary | ICD-10-CM | POA: Diagnosis not present

## 2015-08-22 DIAGNOSIS — R109 Unspecified abdominal pain: Secondary | ICD-10-CM | POA: Diagnosis not present

## 2015-08-22 DIAGNOSIS — E669 Obesity, unspecified: Secondary | ICD-10-CM | POA: Insufficient documentation

## 2015-08-22 DIAGNOSIS — Z794 Long term (current) use of insulin: Secondary | ICD-10-CM | POA: Insufficient documentation

## 2015-08-22 DIAGNOSIS — Z87891 Personal history of nicotine dependence: Secondary | ICD-10-CM | POA: Insufficient documentation

## 2015-08-22 DIAGNOSIS — B349 Viral infection, unspecified: Secondary | ICD-10-CM | POA: Insufficient documentation

## 2015-08-22 DIAGNOSIS — R112 Nausea with vomiting, unspecified: Secondary | ICD-10-CM | POA: Diagnosis present

## 2015-08-22 DIAGNOSIS — Z79899 Other long term (current) drug therapy: Secondary | ICD-10-CM | POA: Insufficient documentation

## 2015-08-22 DIAGNOSIS — G8929 Other chronic pain: Secondary | ICD-10-CM | POA: Diagnosis not present

## 2015-08-22 DIAGNOSIS — R531 Weakness: Secondary | ICD-10-CM | POA: Diagnosis not present

## 2015-08-22 DIAGNOSIS — Z8719 Personal history of other diseases of the digestive system: Secondary | ICD-10-CM | POA: Diagnosis not present

## 2015-08-22 HISTORY — DX: Unspecified osteoarthritis, unspecified site: M19.90

## 2015-08-22 LAB — LIPASE, BLOOD: LIPASE: 31 U/L (ref 11–51)

## 2015-08-22 LAB — URINALYSIS, ROUTINE W REFLEX MICROSCOPIC
Bilirubin Urine: NEGATIVE
HGB URINE DIPSTICK: NEGATIVE
KETONES UR: 15 mg/dL — AB
LEUKOCYTES UA: NEGATIVE
Nitrite: NEGATIVE
PROTEIN: NEGATIVE mg/dL
Specific Gravity, Urine: 1.034 — ABNORMAL HIGH (ref 1.005–1.030)
pH: 5.5 (ref 5.0–8.0)

## 2015-08-22 LAB — COMPREHENSIVE METABOLIC PANEL
ALT: 30 U/L (ref 14–54)
ANION GAP: 11 (ref 5–15)
AST: 24 U/L (ref 15–41)
Albumin: 3.7 g/dL (ref 3.5–5.0)
Alkaline Phosphatase: 78 U/L (ref 38–126)
BILIRUBIN TOTAL: 0.4 mg/dL (ref 0.3–1.2)
BUN: 13 mg/dL (ref 6–20)
CHLORIDE: 101 mmol/L (ref 101–111)
CO2: 24 mmol/L (ref 22–32)
Calcium: 9.4 mg/dL (ref 8.9–10.3)
Creatinine, Ser: 1.21 mg/dL — ABNORMAL HIGH (ref 0.44–1.00)
GFR calc Af Amer: >60 mL/min (ref >60)
GFR, EST NON AFRICAN AMERICAN: 53 mL/min — AB (ref >60)
Glucose, Bld: 262 mg/dL — ABNORMAL HIGH (ref 65–99)
POTASSIUM: 3.4 mmol/L — AB (ref 3.5–5.1)
Sodium: 136 mmol/L (ref 135–145)
TOTAL PROTEIN: 7.3 g/dL (ref 6.5–8.1)

## 2015-08-22 LAB — CBC
HCT: 40.5 % (ref 36.0–46.0)
Hemoglobin: 13.5 g/dL (ref 12.0–15.0)
MCH: 28.9 pg (ref 26.0–34.0)
MCHC: 33.3 g/dL (ref 30.0–36.0)
MCV: 86.7 fL (ref 78.0–100.0)
PLATELETS: 318 10*3/uL (ref 150–400)
RBC: 4.67 MIL/uL (ref 3.87–5.11)
RDW: 13.8 % (ref 11.5–15.5)
WBC: 8.4 10*3/uL (ref 4.0–10.5)

## 2015-08-22 LAB — URINE MICROSCOPIC-ADD ON

## 2015-08-22 LAB — CBG MONITORING, ED: GLUCOSE-CAPILLARY: 250 mg/dL — AB (ref 65–99)

## 2015-08-22 LAB — PREGNANCY, URINE: Preg Test, Ur: NEGATIVE

## 2015-08-22 LAB — CK: CK TOTAL: 97 U/L (ref 38–234)

## 2015-08-22 MED ORDER — ONDANSETRON HCL 4 MG/2ML IJ SOLN
4.0000 mg | Freq: Once | INTRAMUSCULAR | Status: AC
  Administered 2015-08-22: 4 mg via INTRAVENOUS
  Filled 2015-08-22: qty 2

## 2015-08-22 MED ORDER — FREESTYLE LITE DEVI: Status: DC

## 2015-08-22 MED ORDER — FREESTYLE LANCETS MISC: Status: DC

## 2015-08-22 MED ORDER — KETOROLAC TROMETHAMINE 30 MG/ML IJ SOLN
30.0000 mg | Freq: Once | INTRAMUSCULAR | Status: AC
  Administered 2015-08-22: 30 mg via INTRAVENOUS
  Filled 2015-08-22: qty 1

## 2015-08-22 MED ORDER — ONDANSETRON 4 MG PO TBDP: 4.0000 mg | ORAL_TABLET | Freq: Three times a day (TID) | ORAL | Status: DC | PRN

## 2015-08-22 MED ORDER — TRAMADOL HCL 50 MG PO TABS
50.0000 mg | ORAL_TABLET | Freq: Four times a day (QID) | ORAL | Status: DC | PRN
Start: 2015-08-22 — End: 2015-10-12

## 2015-08-22 MED ORDER — MORPHINE SULFATE (PF) 4 MG/ML IV SOLN: 4.0000 mg | INTRAVENOUS | Status: DC | PRN

## 2015-08-22 MED ORDER — SODIUM CHLORIDE 0.9 % IV BOLUS (SEPSIS)
1000.0000 mL | Freq: Once | INTRAVENOUS | Status: AC
  Administered 2015-08-22: 1000 mL via INTRAVENOUS

## 2015-08-22 MED ORDER — GLUCOSE BLOOD VI STRP: ORAL_STRIP | Status: DC

## 2015-08-22 NOTE — Telephone Encounter (Signed)
Patient stated the pharmacy said they needed Dx code on Free style lite meter test strips, Lancets,

## 2015-08-22 NOTE — ED Notes (Signed)
Patient up to the restroom.

## 2015-08-22 NOTE — ED Notes (Signed)
Pt reports generalized body aches and weakness, has h/a, chills, no appetite. Vomiting x 2 today. Is diabetic, CBG's running 200-300. Pt is a x 4

## 2015-08-22 NOTE — Discharge Instructions (Signed)
Rest, stay hydrated. Follow up with PCP as needed.  Muscle Pain, Adult Muscle pain (myalgia) may be caused by many things, including:  Overuse or muscle strain, especially if you are not in shape. This is the most common cause of muscle pain.  Injury.  Bruises.  Viruses, such as the flu.  Infectious diseases.  Fibromyalgia, which is a chronic condition that causes muscle tenderness, fatigue, and headache.  Autoimmune diseases, including lupus.  Certain drugs, including ACE inhibitors and statins. Muscle pain may be mild or severe. In most cases, the pain lasts only a short time and goes away without treatment. To diagnose the cause of your muscle pain, your health care provider will take your medical history. This means he or she will ask you when your muscle pain began and what has been happening. If you have not had muscle pain for very long, your health care provider may want to wait before doing much testing. If your muscle pain has lasted a long time, your health care provider may want to run tests right away. If your health care provider thinks your muscle pain may be caused by illness, you may need to have additional tests to rule out certain conditions.  Treatment for muscle pain depends on the cause. Home care is often enough to relieve muscle pain. Your health care provider may also prescribe anti-inflammatory medicine. HOME CARE INSTRUCTIONS Watch your condition for any changes. The following actions may help to lessen any discomfort you are feeling:  Only take over-the-counter or prescription medicines as directed by your health care provider.  Apply ice to the sore muscle:  Put ice in a plastic bag.  Place a towel between your skin and the bag.  Leave the ice on for 15-20 minutes, 3-4 times a day.  You may alternate applying hot and cold packs to the muscle as directed by your health care provider.  If overuse is causing your muscle pain, slow down your activities  until the pain goes away.  Remember that it is normal to feel some muscle pain after starting a workout program. Muscles that have not been used often will be sore at first.  Do regular, gentle exercises if you are not usually active.  Warm up before exercising to lower your risk of muscle pain.  Do not continue working out if the pain is very bad. Bad pain could mean you have injured a muscle. SEEK MEDICAL CARE IF:  Your muscle pain gets worse, and medicines do not help.  You have muscle pain that lasts longer than 3 days.  You have a rash or fever along with muscle pain.  You have muscle pain after a tick bite.  You have muscle pain while working out, even though you are in good physical condition.  You have redness, soreness, or swelling along with muscle pain.  You have muscle pain after starting a new medicine or changing the dose of a medicine. SEEK IMMEDIATE MEDICAL CARE IF:  You have trouble breathing.  You have trouble swallowing.  You have muscle pain along with a stiff neck, fever, and vomiting.  You have severe muscle weakness or cannot move part of your body. MAKE SURE YOU:   Understand these instructions.  Will watch your condition.  Will get help right away if you are not doing well or get worse.   This information is not intended to replace advice given to you by your health care provider. Make sure you discuss any questions you  have with your health care provider.   Document Released: 07/31/2006 Document Revised: 09/29/2014 Document Reviewed: 07/05/2013 Elsevier Interactive Patient Education [employer].

## 2015-08-22 NOTE — ED Notes (Signed)
Patient transported to X-ray 

## 2015-08-22 NOTE — ED Provider Notes (Signed)
CSN: [id number]     Arrival date & time 08/22/15  1437 History   First MD Initiated Contact with Patient 08/22/15 1740     Chief Complaint  Patient presents with  . Emesis  . Generalized Body Aches      HPI  Patient presents for evaluation after 4-5 day illness. States that she feels "achy all over". So poor appetite and nausea. Vomiting twice today. Blood sugars running 200-300 which is not particularly high for her.  Denies fever. Mild headache. Diffuse muscle aches. No chest pain. Abdominal pain. No diarrhea.  Past Medical History  Diagnosis Date  . Hypertension   . Hypokalemia   . Asthma   . DM (diabetes mellitus) ([place])     INSULIN DEPENDENT  . Depression   . GERD (gastroesophageal reflux disease)   . Headache(784.0)   . Chronic back pain   . Chronic chest pain   . Arthritis    Past Surgical History  Procedure Laterality Date  . Cesarean section      x 3  . Appendectomy    . Vesicovaginal fistula closure w/ tah  2009  . Hernia repair    . Cholecystectomy N/A 03/02/2014    Procedure: LAPAROSCOPIC CHOLECYSTECTOMY;  Surgeon: Joyice Faster. Cornett, MD;  Location: [place];  Service: General;  Laterality: N/A;  . Rotator cuff repair     Family History  Problem Relation Age of Onset  . Heart attack Mother   . Stroke Mother   . Diabetes Mother   . Hypertension Mother   . Arthritis Mother   . Stroke Father   . Hypertension Sister   . Diabetes Sister   . Seizures Brother   . Diabetes Brother    Social History  Substance Use Topics  . Smoking status: Former Smoker -- 0.30 packs/day for 22 years    Types: Cigarettes    Quit date: 05/01/2011  . Smokeless tobacco: Never Used  . Alcohol Use: 0.0 oz/week    0 Standard drinks or equivalent per week     Comment: rare   OB History    No data available     Review of Systems  Constitutional: Positive for activity change. Negative for fever, chills, diaphoresis, appetite change and fatigue.  HENT: Negative for mouth  sores, sore throat and trouble swallowing.   Eyes: Negative for visual disturbance.  Respiratory: Negative for cough, chest tightness, shortness of breath and wheezing.   Cardiovascular: Negative for chest pain.  Gastrointestinal: Positive for nausea. Negative for vomiting, abdominal pain, diarrhea and abdominal distention.  Endocrine: Negative for polydipsia, polyphagia and polyuria.  Genitourinary: Negative for dysuria, frequency and hematuria.  Musculoskeletal: Positive for myalgias. Negative for gait problem.  Skin: Negative for color change, pallor and rash.  Neurological: Positive for weakness. Negative for dizziness, syncope, light-headedness and headaches.  Hematological: Does not bruise/bleed easily.  Psychiatric/Behavioral: Negative for behavioral problems and confusion.      Allergies  Reglan  Home Medications   Prior to Admission medications   Medication Sig Start Date End Date Taking? Authorizing Provider  albuterol (PROVENTIL HFA;VENTOLIN HFA) 108 (90 BASE) MCG/ACT inhaler Inhale 2 puffs into the lungs every 6 (six) hours as needed for wheezing or shortness of breath.   Yes Historical Provider, MD  albuterol (PROVENTIL) (2.5 MG/3ML) 0.083% nebulizer solution Take 2.5 mg by nebulization every 6 (six) hours as needed for wheezing or shortness of breath.   Yes Historical Provider, MD  amLODipine (NORVASC) 10 MG tablet Take 1 tablet (  10 mg total) by mouth daily. 08/01/15  Yes Lance Bosch, NP  aspirin EC 325 MG tablet Take 325 mg by mouth daily.   Yes Historical Provider, MD  Aspirin-Acetaminophen-Caffeine (GOODY HEADACHE PO) Take 1 Package by mouth daily as needed (for leg pain).   Yes Historical Provider, MD  betamethasone valerate (VALISONE) 0.1 % cream Apply topically 2 (two) times daily. 08/01/15  Yes Lance Bosch, NP  carvedilol (COREG) 3.125 MG tablet TAKE 1 TABLET BY MOUTH 2 TIMES DAILY WITH A MEAL. 04/06/15  Yes Lance Bosch, NP  cyclobenzaprine (FLEXERIL) 5 MG  tablet TAKE 1 TABLET BY MOUTH 3 TIMES DAILY AS NEEDED FOR MUSCLE SPASMS. 12/14/14  Yes Lance Bosch, NP  dapagliflozin propanediol (FARXIGA) 5 MG TABS tablet Take 5 mg by mouth daily. 08/13/15  Yes Renato Shin, MD  Dulaglutide (TRULICITY) [id number] 0000000 SOPN Inject 0.75 mg into the skin once a week. 08/13/15  Yes Renato Shin, MD  Fluticasone-Salmeterol (ADVAIR) 250-50 MCG/DOSE AEPB Inhale 1 puff into the lungs 2 times daily at 12 noon and 4 pm. Patient taking differently: Inhale 1 puff into the lungs daily as needed (shortness of breath).  06/06/14  Yes Carly Montey Hora, MD  gabapentin (NEURONTIN) 300 MG capsule Take 1 capsule (300 mg total) by mouth 3 (three) times daily. 04/06/15  Yes Lance Bosch, NP  hydrochlorothiazide (HYDRODIURIL) 25 MG tablet Take 1 tablet (25 mg total) by mouth daily. 08/01/15  Yes Lance Bosch, NP  insulin glargine (LANTUS) 100 UNIT/ML injection Inject 0.3 mLs (30 Units total) into the skin every morning. Patient taking differently: Inject 30 Units into the skin every morning. Different 08/13/15  Yes Renato Shin, MD  ipratropium (ATROVENT) 0.02 % nebulizer solution Take 0.5 mg by nebulization every 6 (six) hours as needed for wheezing or shortness of breath.    Yes Historical Provider, MD  losartan (COZAAR) 100 MG tablet Take 1 tablet (100 mg total) by mouth daily. For Blood pressure 08/01/15  Yes Lance Bosch, NP  meloxicam (MOBIC) 7.5 MG tablet Take 7.5 mg by mouth daily.   Yes Historical Provider, MD  sertraline (ZOLOFT) 50 MG tablet Take 1 tablet (50 mg total) by mouth daily. 04/06/15  Yes Lance Bosch, NP  simvastatin (ZOCOR) 10 MG tablet Take 1 tablet (10 mg total) by mouth daily at 6 PM. 08/01/15  Yes Lance Bosch, NP  SUMAtriptan (IMITREX) 100 MG tablet TAKE 1TAB AT EARLIEST ONSET OF HEADACHE. MAY REPEAT X1 IN 2 HOURS IF HEADACHE PERSISTS OR RECURS. DO NOT EXCEED 2 TABS IN 24HRS 04/06/15  Yes Adam Telford Nab, DO  Blood Glucose Monitoring Suppl (FREESTYLE LITE) DEVI  Use to check blood sugar 3 times per day. Dx code: E11.9 08/22/15   Renato Shin, MD  glucose blood (FREESTYLE LITE) test strip Use to check blood sugar 3 times per day. Dx Code E11.9 08/22/15   Renato Shin, MD  Lancets (FREESTYLE) lancets Use to check blood sugar 3 times per day. Dx Code E11.9 08/22/15   Renato Shin, MD  ondansetron (ZOFRAN ODT) 4 MG disintegrating tablet Take 1 tablet (4 mg total) by mouth every 8 (eight) hours as needed for nausea. 08/22/15   Tanna Furry, MD  traMADol (ULTRAM) 50 MG tablet Take 1 tablet (50 mg total) by mouth every 6 (six) hours as needed. 08/22/15   Tanna Furry, MD   BP 133/89 mmHg  Pulse 93  Temp(Src) 98.5 F (36.9 C) (Oral)  Resp 24  SpO2 97% Physical Exam  Constitutional: She is oriented to person, place, and time. She appears well-developed and well-nourished. No distress.  Obese female. Awake and alert. No acute distress. Posterior pharynx benign.  HENT:  Head: Normocephalic.  Eyes: Conjunctivae are normal. Pupils are equal, round, and reactive to light. No scleral icterus.  Neck: Normal range of motion. Neck supple. No thyromegaly present.  Cardiovascular: Normal rate and regular rhythm.  Exam reveals no gallop and no friction rub.   No murmur heard. Pulmonary/Chest: Effort normal and breath sounds normal. No respiratory distress. She has no wheezes. She has no rales.  Clear bilateral breath sounds. No increased work of breathing.  Abdominal: Soft. Bowel sounds are normal. She exhibits no distension. There is no tenderness. There is no rebound.  Abdomen soft benign NABS.  Musculoskeletal: Normal range of motion.  Neurological: She is alert and oriented to person, place, and time.  Skin: Skin is warm and dry. No rash noted.  Psychiatric: She has a normal mood and affect. Her behavior is normal.    ED Course  Procedures (including critical care time) Labs Review Labs Reviewed  COMPREHENSIVE METABOLIC PANEL - Abnormal; Notable for the  following:    Potassium 3.4 (*)    Glucose, Bld 262 (*)    Creatinine, Ser 1.21 (*)    GFR calc non Af Amer 53 (*)    All other components within normal limits  URINALYSIS, ROUTINE W REFLEX MICROSCOPIC (NOT AT [facility]) - Abnormal; Notable for the following:    APPearance HAZY (*)    Specific Gravity, Urine 1.034 (*)    Glucose, UA >1000 (*)    Ketones, ur 15 (*)    All other components within normal limits  URINE MICROSCOPIC-ADD ON - Abnormal; Notable for the following:    Squamous Epithelial / LPF TOO NUMEROUS TO COUNT (*)    Bacteria, UA FEW (*)    All other components within normal limits  CBG MONITORING, ED - Abnormal; Notable for the following:    Glucose-Capillary 250 (*)    All other components within normal limits  LIPASE, BLOOD  CBC  PREGNANCY, URINE  CK    Imaging Review Dg Chest 2 View  08/22/2015  CLINICAL DATA:  Chest and back pain. Cough. Wheezing. Shortness breath. Chills. EXAM: CHEST  2 VIEW COMPARISON:  06/04/2014 FINDINGS: The heart size and mediastinal contours are within normal limits. Both lungs are clear. The visualized skeletal structures are unremarkable. IMPRESSION: No active cardiopulmonary disease. Electronically Signed   By: Earle Gell M.D.   On: 08/22/2015 19:13   I have personally reviewed and evaluated these images and lab results as part of my medical decision-making.   EKG Interpretation None      MDM   Final diagnoses:  Viral syndrome  Muscle ache    Contaminated urine. Culture pending. Reassuring labs. Glucose 262. Not febrile or hypoxemic. Not tachycardic. Feeling improved after hydration and symptom control. Appropriate for discharge home.  She is on a statin. Have asked her to discontinue this for the next few days. She may resume after her symptoms improve. Follow up with her physician regarding restarting.    Tanna Furry, MD 08/22/15 2115

## 2015-08-22 NOTE — ED Notes (Signed)
MD at bedside. 

## 2015-08-22 NOTE — ED Notes (Signed)
CBG 250 

## 2015-08-22 NOTE — Telephone Encounter (Signed)
Rx submitted per pt's request.  

## 2015-08-24 ENCOUNTER — Telehealth: Payer: Self-pay | Admitting: Endocrinology

## 2015-08-24 MED ORDER — GLUCOSE BLOOD VI STRP: ORAL_STRIP | Status: DC

## 2015-08-24 MED ORDER — ACCU-CHEK MULTICLIX LANCETS MISC: Status: DC

## 2015-08-24 MED ORDER — ACCU-CHEK AVIVA PLUS W/DEVICE KIT: PACK | Status: DC

## 2015-08-24 NOTE — Telephone Encounter (Signed)
Patient called stating that her insurance will only cover the Accu Check meter and test strips   Please send to pharmacy   Thank you

## 2015-08-24 NOTE — Telephone Encounter (Signed)
Rx submitted per pt's request.  

## 2015-08-29 ENCOUNTER — Telehealth: Payer: Self-pay | Admitting: Endocrinology

## 2015-08-29 DIAGNOSIS — M75122 Complete rotator cuff tear or rupture of left shoulder, not specified as traumatic: Secondary | ICD-10-CM | POA: Diagnosis not present

## 2015-08-29 MED ORDER — ACCU-CHEK SOFTCLIX LANCET DEV MISC: Status: DC

## 2015-08-29 NOTE — Telephone Encounter (Signed)
lancet rx needs to be for the soft clix please call back into [facility] thank you

## 2015-08-29 NOTE — Telephone Encounter (Signed)
Rx submitted per pt's request.  

## 2015-08-30 DIAGNOSIS — M75122 Complete rotator cuff tear or rupture of left shoulder, not specified as traumatic: Secondary | ICD-10-CM | POA: Diagnosis not present

## 2015-08-30 DIAGNOSIS — M25612 Stiffness of left shoulder, not elsewhere classified: Secondary | ICD-10-CM | POA: Diagnosis not present

## 2015-08-30 DIAGNOSIS — M6281 Muscle weakness (generalized): Secondary | ICD-10-CM | POA: Diagnosis not present

## 2015-08-30 DIAGNOSIS — M25512 Pain in left shoulder: Secondary | ICD-10-CM | POA: Diagnosis not present

## 2015-08-30 IMAGING — CT CT HEAD W/O CM
1 of 2 series · 16 of 30 positions shown, 20 images · non-contrast
Comparison: CT scan of September 24, 2012.

CLINICAL DATA: Migraine headache.

EXAM:
CT HEAD WITHOUT CONTRAST
TECHNIQUE: Contiguous axial images were obtained from the base of the skull
through the vertex without intravenous contrast.

[Series 3: head 2.0 h70h · axial · 0.41mm/px · z∈[+1073,+1207]mm · 16 of 75 slices shown, 20 images]
[im 4/75  brain]
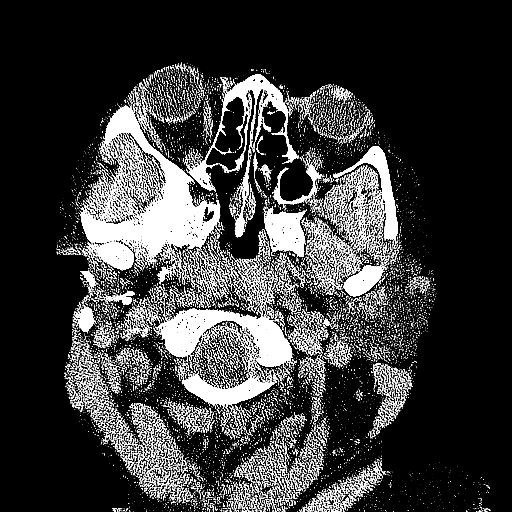
[im 4/75  bone]
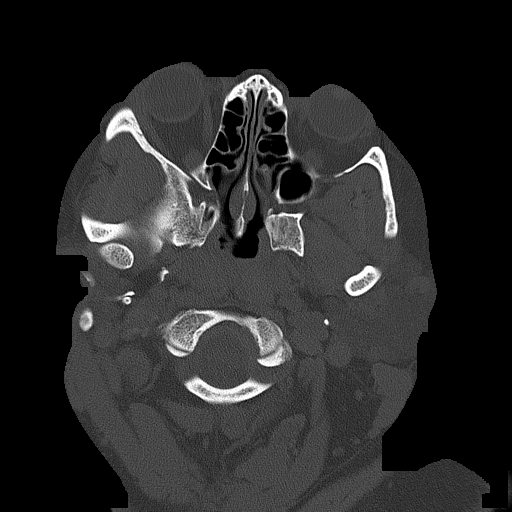
[im 8/75  brain]
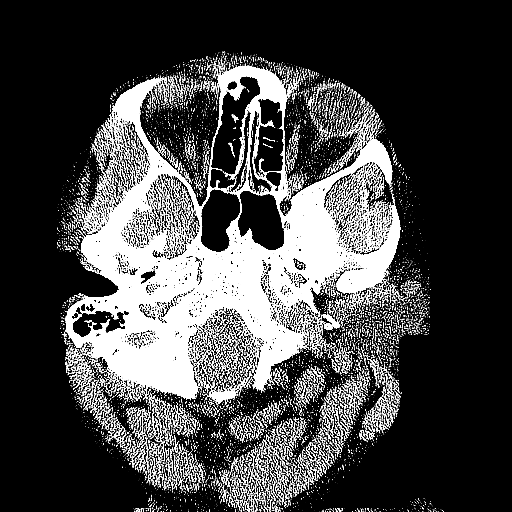
[im 12/75  brain]
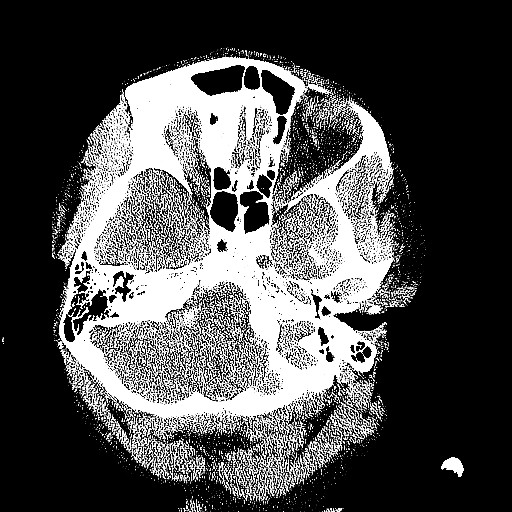
[im 19/75  brain]
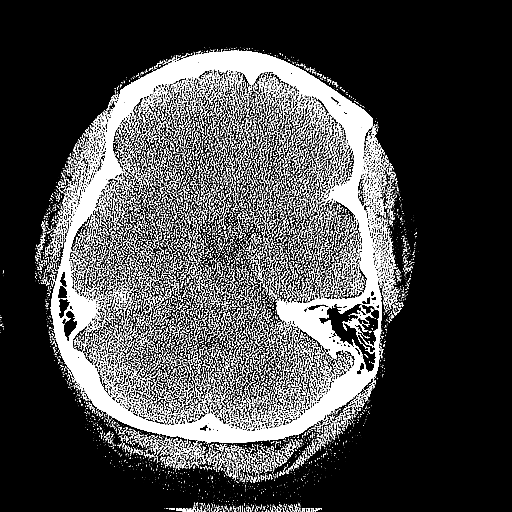
[im 23/75  brain]
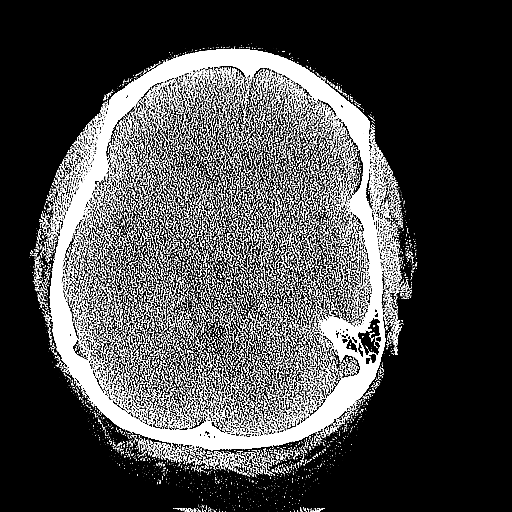
[im 23/75  bone]
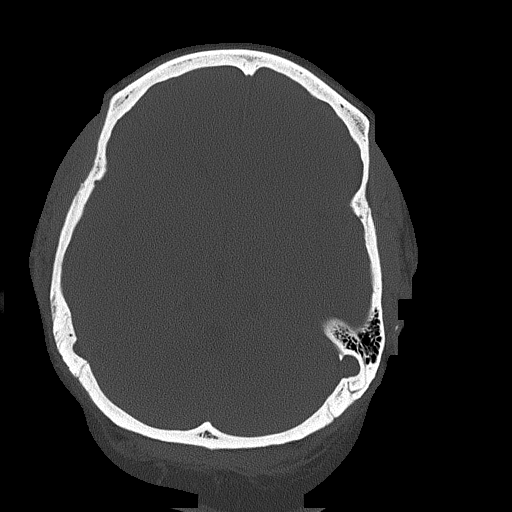
[im 26/75  brain]
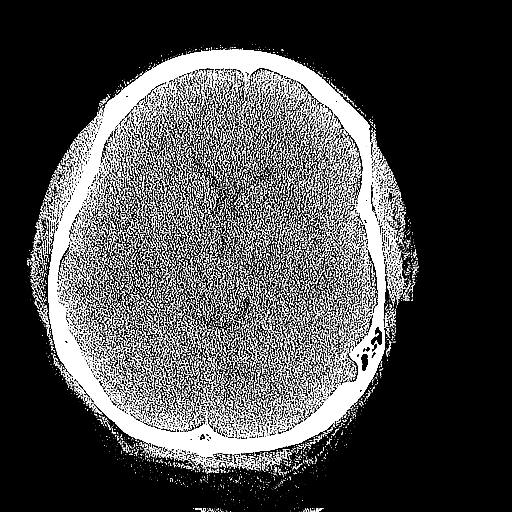
[im 30/75  brain]
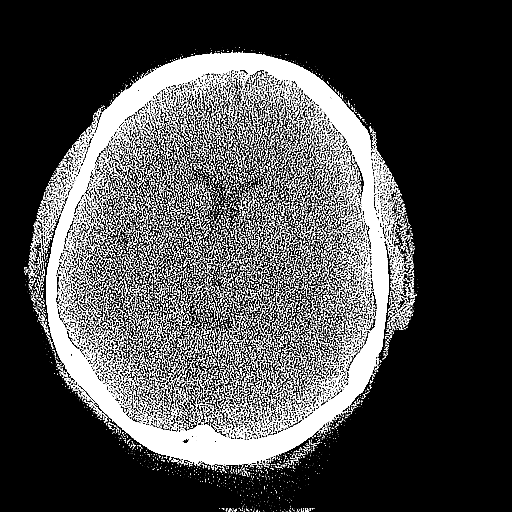
[im 34/75  brain]
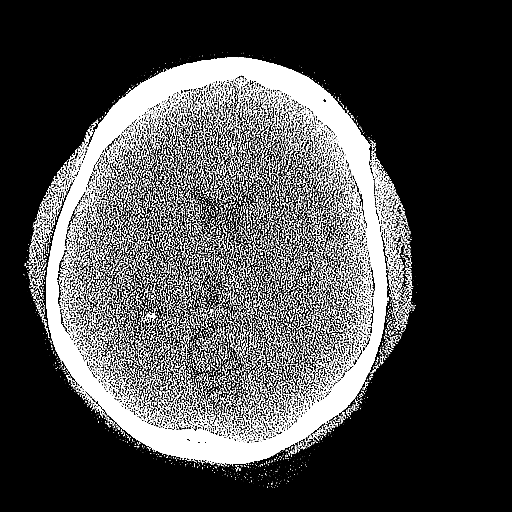
[im 41/75  brain]
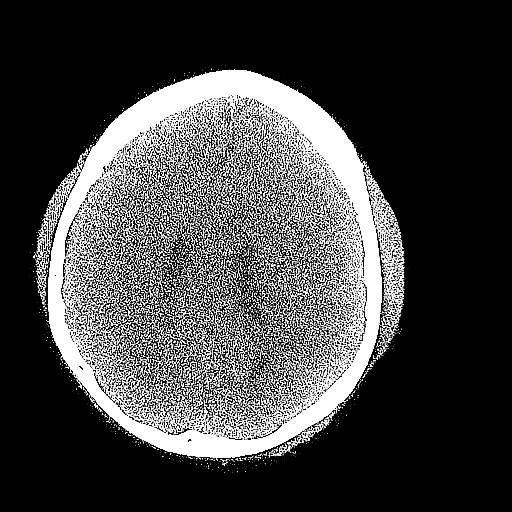
[im 41/75  bone]
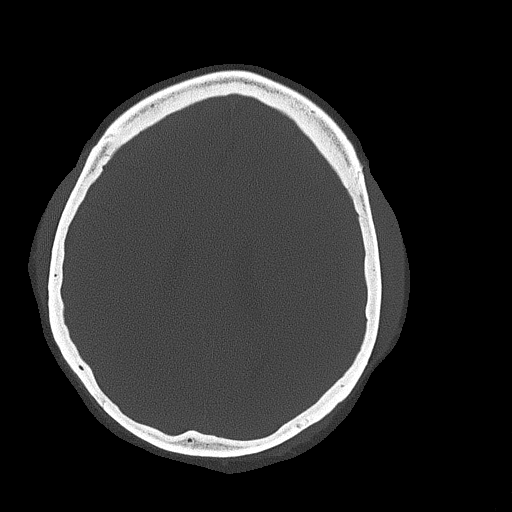
[im 45/75  brain]
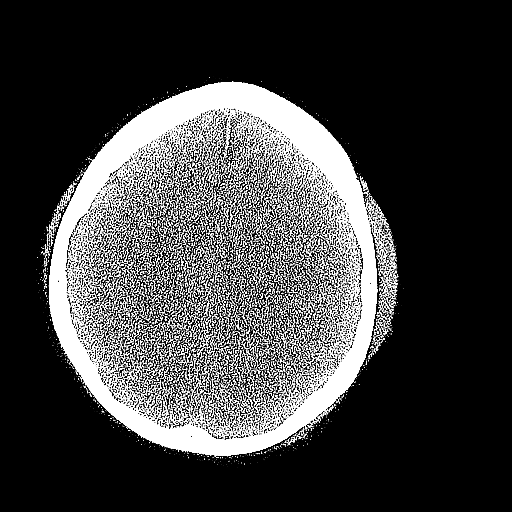
[im 49/75  brain]
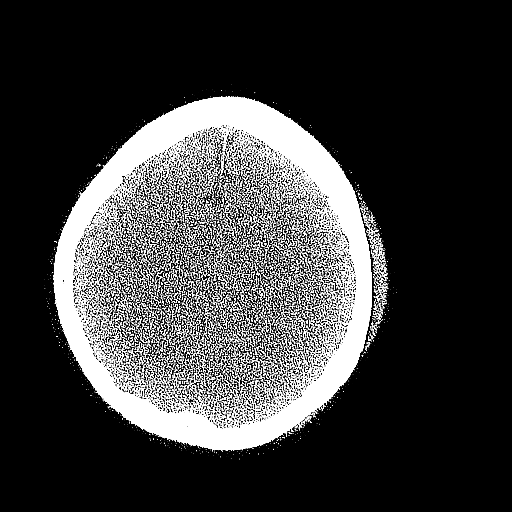
[im 52/75  brain]
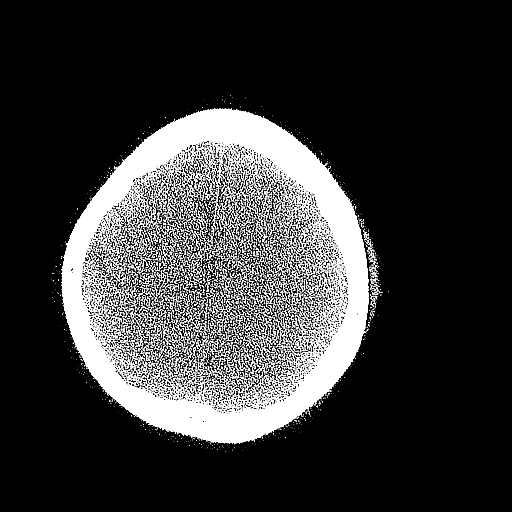
[im 56/75  brain]
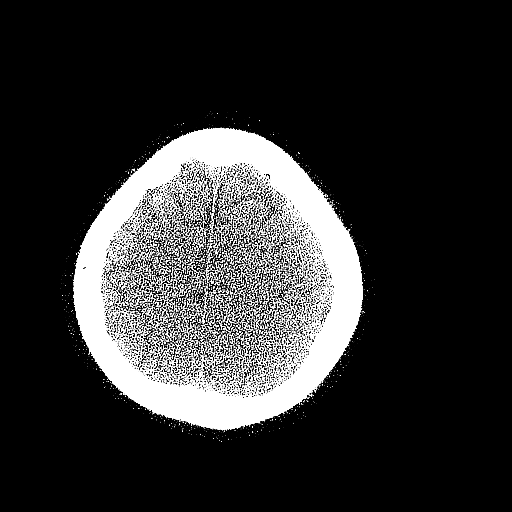
[im 56/75  bone]
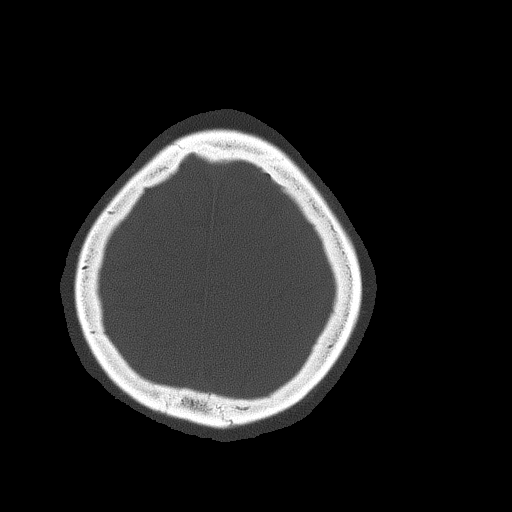
[im 63/75  brain]
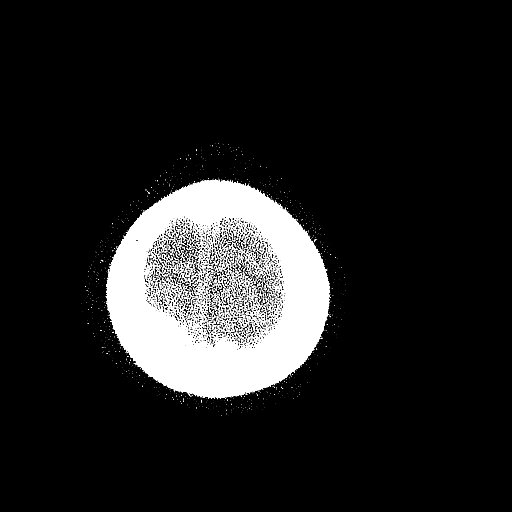
[im 67/75  brain]
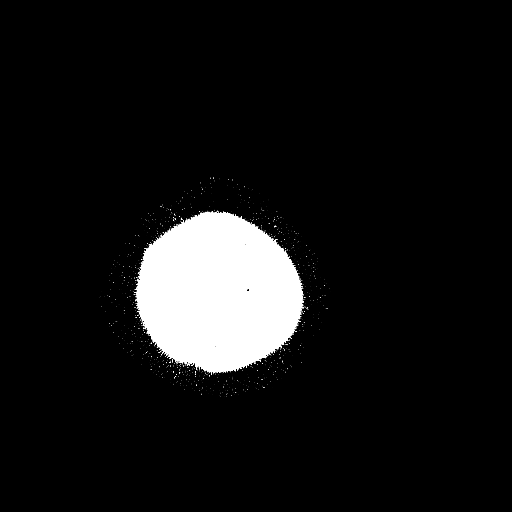
[im 71/75  brain]
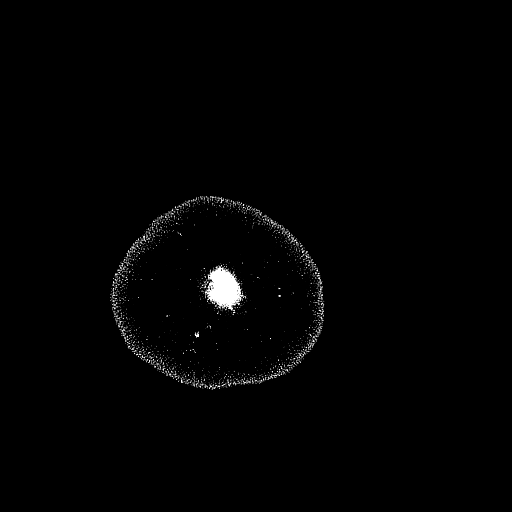

[16 of 30 positions shown; findings below may reference images not displayed]

FINDINGS: Bony calvarium appears intact. No mass effect or midline shift is
noted. Ventricular size is within normal limits. There is no
evidence of mass lesion, hemorrhage or acute infarction.
IMPRESSION: Normal head CT.

## 2015-09-03 ENCOUNTER — Ambulatory Visit: Payer: Medicare Other | Admitting: Endocrinology

## 2015-09-06 DIAGNOSIS — M75122 Complete rotator cuff tear or rupture of left shoulder, not specified as traumatic: Secondary | ICD-10-CM | POA: Diagnosis not present

## 2015-09-06 DIAGNOSIS — M25612 Stiffness of left shoulder, not elsewhere classified: Secondary | ICD-10-CM | POA: Diagnosis not present

## 2015-09-06 DIAGNOSIS — M25512 Pain in left shoulder: Secondary | ICD-10-CM | POA: Diagnosis not present

## 2015-09-06 DIAGNOSIS — M6281 Muscle weakness (generalized): Secondary | ICD-10-CM | POA: Diagnosis not present

## 2015-09-12 ENCOUNTER — Emergency Department (HOSPITAL_COMMUNITY)
Admission: EM | Admit: 2015-09-12 | Discharge: 2015-09-12 | Disposition: A | Discharge status: Home / Self Care | Payer: Medicare Other | Attending: Emergency Medicine | Admitting: Emergency Medicine

## 2015-09-12 ENCOUNTER — Ambulatory Visit: Payer: Medicare Other | Admitting: Endocrinology

## 2015-09-12 ENCOUNTER — Encounter (HOSPITAL_COMMUNITY): Payer: Self-pay

## 2015-09-12 DIAGNOSIS — Z7951 Long term (current) use of inhaled steroids: Secondary | ICD-10-CM | POA: Diagnosis not present

## 2015-09-12 DIAGNOSIS — Z7982 Long term (current) use of aspirin: Secondary | ICD-10-CM | POA: Diagnosis not present

## 2015-09-12 DIAGNOSIS — Z87891 Personal history of nicotine dependence: Secondary | ICD-10-CM | POA: Diagnosis not present

## 2015-09-12 DIAGNOSIS — R1030 Lower abdominal pain, unspecified: Secondary | ICD-10-CM | POA: Insufficient documentation

## 2015-09-12 DIAGNOSIS — M1990 Unspecified osteoarthritis, unspecified site: Secondary | ICD-10-CM | POA: Diagnosis not present

## 2015-09-12 DIAGNOSIS — M5442 Lumbago with sciatica, left side: Secondary | ICD-10-CM | POA: Insufficient documentation

## 2015-09-12 DIAGNOSIS — G8929 Other chronic pain: Secondary | ICD-10-CM | POA: Insufficient documentation

## 2015-09-12 DIAGNOSIS — Z79899 Other long term (current) drug therapy: Secondary | ICD-10-CM | POA: Diagnosis not present

## 2015-09-12 DIAGNOSIS — Z7984 Long term (current) use of oral hypoglycemic drugs: Secondary | ICD-10-CM | POA: Diagnosis not present

## 2015-09-12 DIAGNOSIS — G43909 Migraine, unspecified, not intractable, without status migrainosus: Secondary | ICD-10-CM | POA: Diagnosis not present

## 2015-09-12 DIAGNOSIS — E119 Type 2 diabetes mellitus without complications: Secondary | ICD-10-CM | POA: Insufficient documentation

## 2015-09-12 DIAGNOSIS — Z8719 Personal history of other diseases of the digestive system: Secondary | ICD-10-CM | POA: Insufficient documentation

## 2015-09-12 DIAGNOSIS — I10 Essential (primary) hypertension: Secondary | ICD-10-CM | POA: Diagnosis not present

## 2015-09-12 DIAGNOSIS — J45909 Unspecified asthma, uncomplicated: Secondary | ICD-10-CM | POA: Diagnosis not present

## 2015-09-12 DIAGNOSIS — Z791 Long term (current) use of non-steroidal anti-inflammatories (NSAID): Secondary | ICD-10-CM | POA: Diagnosis not present

## 2015-09-12 DIAGNOSIS — Z794 Long term (current) use of insulin: Secondary | ICD-10-CM | POA: Diagnosis not present

## 2015-09-12 DIAGNOSIS — F329 Major depressive disorder, single episode, unspecified: Secondary | ICD-10-CM | POA: Diagnosis not present

## 2015-09-12 DIAGNOSIS — G629 Polyneuropathy, unspecified: Secondary | ICD-10-CM | POA: Insufficient documentation

## 2015-09-12 DIAGNOSIS — M545 Low back pain: Secondary | ICD-10-CM | POA: Diagnosis present

## 2015-09-12 LAB — CBC WITH DIFFERENTIAL/PLATELET
BASOS ABS: 0 10*3/uL (ref 0.0–0.1)
BASOS PCT: 0 %
Eosinophils Absolute: 0.1 10*3/uL (ref 0.0–0.7)
Eosinophils Relative: 1 %
HEMATOCRIT: 36.6 % (ref 36.0–46.0)
HEMOGLOBIN: 12.1 g/dL (ref 12.0–15.0)
Lymphocytes Relative: 44 %
Lymphs Abs: 3.3 10*3/uL (ref 0.7–4.0)
MCH: 28.7 pg (ref 26.0–34.0)
MCHC: 33.1 g/dL (ref 30.0–36.0)
MCV: 86.9 fL (ref 78.0–100.0)
Monocytes Absolute: 0.5 10*3/uL (ref 0.1–1.0)
Monocytes Relative: 7 %
NEUTROS ABS: 3.6 10*3/uL (ref 1.7–7.7)
NEUTROS PCT: 48 %
Platelets: 286 10*3/uL (ref 150–400)
RBC: 4.21 MIL/uL (ref 3.87–5.11)
RDW: 13.7 % (ref 11.5–15.5)
WBC: 7.5 10*3/uL (ref 4.0–10.5)

## 2015-09-12 LAB — URINALYSIS, ROUTINE W REFLEX MICROSCOPIC
Bilirubin Urine: NEGATIVE
Glucose, UA: 100 mg/dL — AB
Hgb urine dipstick: NEGATIVE
KETONES UR: NEGATIVE mg/dL
LEUKOCYTES UA: NEGATIVE
NITRITE: NEGATIVE
PH: 7 (ref 5.0–8.0)
Protein, ur: NEGATIVE mg/dL
Specific Gravity, Urine: 1.012 (ref 1.005–1.030)

## 2015-09-12 LAB — BASIC METABOLIC PANEL
ANION GAP: 9 (ref 5–15)
BUN: 8 mg/dL (ref 6–20)
CALCIUM: 9.1 mg/dL (ref 8.9–10.3)
CO2: 25 mmol/L (ref 22–32)
Chloride: 106 mmol/L (ref 101–111)
Creatinine, Ser: 0.85 mg/dL (ref 0.44–1.00)
Glucose, Bld: 184 mg/dL — ABNORMAL HIGH (ref 65–99)
Potassium: 3.9 mmol/L (ref 3.5–5.1)
Sodium: 140 mmol/L (ref 135–145)

## 2015-09-12 LAB — LIPASE, BLOOD: Lipase: 30 U/L (ref 11–51)

## 2015-09-12 MED ORDER — KETOROLAC TROMETHAMINE 30 MG/ML IJ SOLN
30.0000 mg | Freq: Once | INTRAMUSCULAR | Status: AC
  Administered 2015-09-12: 30 mg via INTRAVENOUS
  Filled 2015-09-12: qty 1

## 2015-09-12 MED ORDER — SODIUM CHLORIDE 0.9 % IV BOLUS (SEPSIS)
1000.0000 mL | Freq: Once | INTRAVENOUS | Status: AC
  Administered 2015-09-12: 1000 mL via INTRAVENOUS

## 2015-09-12 MED ORDER — ONDANSETRON HCL 4 MG/2ML IJ SOLN
4.0000 mg | Freq: Once | INTRAMUSCULAR | Status: AC
  Administered 2015-09-12: 4 mg via INTRAVENOUS
  Filled 2015-09-12: qty 2

## 2015-09-12 MED ORDER — DIAZEPAM 5 MG/ML IJ SOLN
5.0000 mg | Freq: Once | INTRAMUSCULAR | Status: DC
  Filled 2015-09-12: qty 2

## 2015-09-12 MED ORDER — TRAMADOL HCL 50 MG PO TABS: 50.0000 mg | ORAL_TABLET | Freq: Four times a day (QID) | ORAL | Status: DC | PRN

## 2015-09-12 MED ORDER — MORPHINE SULFATE (PF) 2 MG/ML IV SOLN: 2.0000 mg | Freq: Once | INTRAVENOUS | Status: DC

## 2015-09-12 MED ORDER — MORPHINE SULFATE (PF) 4 MG/ML IV SOLN
4.0000 mg | Freq: Once | INTRAVENOUS | Status: AC
  Administered 2015-09-12: 4 mg via INTRAVENOUS
  Filled 2015-09-12: qty 1

## 2015-09-12 NOTE — ED Notes (Signed)
Per PT, Pt is coming from home. Pt reports having back pain that started on the mid left side and radiate to the lower back. Starting yesterday, patient felt the pain wrap around her chest worsening with breathing. Pt reports having pain that now is generally in her whole back with bilateral legs. Pt reports pressure with urination starting yesterday.

## 2015-09-12 NOTE — ED Notes (Signed)
Pt ambulated to bathroom. Pt complained of pressure when she voided and pain in her back.

## 2015-09-12 NOTE — ED Notes (Signed)
Patient reporting pressure/pain in her lower back after urinating.

## 2015-09-12 NOTE — ED Notes (Signed)
Pt unable to void at this time. 

## 2015-09-12 NOTE — ED Provider Notes (Signed)
CSN: [id number]     Arrival date & time 09/12/15  1011 History   First MD Initiated Contact with Patient 09/12/15 1033     Chief Complaint  Patient presents with  . Back Pain   HPI  [name] is an 44 y.o. female with history of DM, HTN, chronic pain, polyneuropathy, chronic migraines who presents to the ED for evaluation of back pain. She states that a few days ago she started feeling diffuse left-sided back pain. She states the pain is the worst around her low back. She states that starting yesterday the pain started wrapping around to her abdomen, particularly suprapubic area. She also endorses associated urinary "pressure" starting yesterday. She denies dysuria, urinary frequency/urgency. She specifically denies chest pain or SOB. She denies history of UTI or kidney stones. She states she has vicodin, tramadol and valium at home for her chronic migraines and chronic back pain, last use of vicodin and valium 2h PTA. She states her current pain feels different than her chronic pain. Denies new weakness, numbness, or tingling. Denies injury or trauma. Denies n/v/d, fever, chills.  Past Medical History  Diagnosis Date  . Hypertension   . Hypokalemia   . Asthma   . DM (diabetes mellitus) ([place])     INSULIN DEPENDENT  . Depression   . GERD (gastroesophageal reflux disease)   . Headache(784.0)   . Chronic back pain   . Chronic chest pain   . Arthritis    Past Surgical History  Procedure Laterality Date  . Cesarean section      x 3  . Appendectomy    . Vesicovaginal fistula closure w/ tah  2009  . Hernia repair    . Cholecystectomy N/A 03/02/2014    Procedure: LAPAROSCOPIC CHOLECYSTECTOMY;  Surgeon: Joyice Faster. Cornett, MD;  Location: [facility];  Service: General;  Laterality: N/A;  . Rotator cuff repair     Family History  Problem Relation Age of Onset  . Heart attack Mother   . Stroke Mother   . Diabetes Mother   . Hypertension Mother   . Arthritis Mother   . Stroke Father   .  Hypertension Sister   . Diabetes Sister   . Seizures Brother   . Diabetes Brother    Social History  Substance Use Topics  . Smoking status: Former Smoker -- 0.30 packs/day for 22 years    Types: Cigarettes    Quit date: 05/01/2011  . Smokeless tobacco: Never Used  . Alcohol Use: 0.0 oz/week    0 Standard drinks or equivalent per week     Comment: rare   OB History    No data available     Review of Systems  All other systems reviewed and are negative.     Allergies  Reglan  Home Medications   Prior to Admission medications   Medication Sig Start Date End Date Taking? Authorizing Provider  albuterol (PROVENTIL HFA;VENTOLIN HFA) 108 (90 BASE) MCG/ACT inhaler Inhale 2 puffs into the lungs every 6 (six) hours as needed for wheezing or shortness of breath.   Yes Historical Provider, MD  albuterol (PROVENTIL) (2.5 MG/3ML) 0.083% nebulizer solution Take 2.5 mg by nebulization every 6 (six) hours as needed for wheezing or shortness of breath.   Yes Historical Provider, MD  amLODipine (NORVASC) 10 MG tablet Take 1 tablet (10 mg total) by mouth daily. 08/01/15  Yes Lance Bosch, NP  aspirin EC 325 MG tablet Take 325 mg by mouth daily.   Yes  Historical Provider, MD  Aspirin-Acetaminophen-Caffeine (GOODY HEADACHE PO) Take 1 Package by mouth daily as needed (for leg pain).   Yes Historical Provider, MD  betamethasone valerate (VALISONE) 0.1 % cream Apply topically 2 (two) times daily. 08/01/15  Yes Lance Bosch, NP  Blood Glucose Monitoring Suppl (ACCU-CHEK AVIVA PLUS) W/DEVICE KIT Use to check blood sugar 3 times per day. DX CODE: E11.9 08/24/15  Yes Renato Shin, MD  Blood Glucose Monitoring Suppl (FREESTYLE LITE) DEVI Use to check blood sugar 3 times per day. Dx code: E11.9 08/22/15  Yes Renato Shin, MD  carvedilol (COREG) 3.125 MG tablet TAKE 1 TABLET BY MOUTH 2 TIMES DAILY WITH A MEAL. 04/06/15  Yes Lance Bosch, NP  cyclobenzaprine (FLEXERIL) 5 MG tablet TAKE 1 TABLET BY MOUTH 3  TIMES DAILY AS NEEDED FOR MUSCLE SPASMS. 12/14/14  Yes Lance Bosch, NP  dapagliflozin propanediol (FARXIGA) 5 MG TABS tablet Take 5 mg by mouth daily. 08/13/15  Yes Renato Shin, MD  Dulaglutide (TRULICITY) 1.88 CZ/6.6AY SOPN Inject 0.75 mg into the skin once a week. 08/13/15  Yes Renato Shin, MD  Fluticasone-Salmeterol (ADVAIR) 250-50 MCG/DOSE AEPB Inhale 1 puff into the lungs 2 times daily at 12 noon and 4 pm. Patient taking differently: Inhale 1 puff into the lungs daily as needed (shortness of breath).  06/06/14  Yes Carly Montey Hora, MD  gabapentin (NEURONTIN) 300 MG capsule Take 1 capsule (300 mg total) by mouth 3 (three) times daily. 04/06/15  Yes Lance Bosch, NP  hydrochlorothiazide (HYDRODIURIL) 25 MG tablet Take 1 tablet (25 mg total) by mouth daily. 08/01/15  Yes Lance Bosch, NP  insulin glargine (LANTUS) 100 UNIT/ML injection Inject 0.3 mLs (30 Units total) into the skin every morning. Patient taking differently: Inject 30 Units into the skin every morning. Different 08/13/15  Yes Renato Shin, MD  ipratropium (ATROVENT) 0.02 % nebulizer solution Take 0.5 mg by nebulization every 6 (six) hours as needed for wheezing or shortness of breath.    Yes Historical Provider, MD  Lancet Devices [facility]) lancets Use to check blood sugar 3 times per day 08/29/15  Yes Renato Shin, MD  Lancets (ACCU-CHEK MULTICLIX) lancets Use to check blood sugar 3 times per day. DX code E11.9 08/24/15  Yes Renato Shin, MD  losartan (COZAAR) 100 MG tablet Take 1 tablet (100 mg total) by mouth daily. For Blood pressure 08/01/15  Yes Lance Bosch, NP  meloxicam (MOBIC) 7.5 MG tablet Take 7.5 mg by mouth daily.   Yes Historical Provider, MD  sertraline (ZOLOFT) 50 MG tablet Take 1 tablet (50 mg total) by mouth daily. 04/06/15  Yes Lance Bosch, NP  simvastatin (ZOCOR) 10 MG tablet Take 1 tablet (10 mg total) by mouth daily at 6 PM. 08/01/15  Yes Lance Bosch, NP  traMADol (ULTRAM) 50 MG tablet Take 1  tablet (50 mg total) by mouth every 6 (six) hours as needed. 08/22/15  Yes Tanna Furry, MD  glucose blood (ACCU-CHEK AVIVA PLUS) test strip Use to check blood sugar 3 times per day.DX Code E11.9 08/24/15   Renato Shin, MD  ondansetron (ZOFRAN ODT) 4 MG disintegrating tablet Take 1 tablet (4 mg total) by mouth every 8 (eight) hours as needed for nausea. 08/22/15   Tanna Furry, MD  ondansetron (ZOFRAN ODT) 4 MG disintegrating tablet Take 1 tablet (4 mg total) by mouth every 8 (eight) hours as needed for nausea. 08/22/15   Tanna Furry, MD  SUMAtriptan (IMITREX) 100 MG  tablet TAKE 1TAB AT EARLIEST ONSET OF HEADACHE. MAY REPEAT X1 IN 2 HOURS IF HEADACHE PERSISTS OR RECURS. DO NOT EXCEED 2 TABS IN 24HRS Patient not taking: Reported on 09/12/2015 04/06/15   Pieter Partridge, DO  traMADol (ULTRAM) 50 MG tablet Take 1 tablet (50 mg total) by mouth every 6 (six) hours as needed. 08/22/15   Tanna Furry, MD   BP 149/97 mmHg  Pulse 87  Temp(Src) 98.2 F (36.8 C) (Oral)  Resp 15  Ht 5' 1"  (1.549 m)  Wt 106.142 kg  BMI 44.24 kg/m2  SpO2 100% Physical Exam  Constitutional: She is oriented to person, place, and time.  Pt is tearful, appears uncomfortable  HENT:  Right Ear: External ear normal.  Left Ear: External ear normal.  Nose: Nose normal.  Mouth/Throat: Oropharynx is clear and moist. No oropharyngeal exudate.  Eyes: Conjunctivae and EOM are normal. Pupils are equal, round, and reactive to light.  Neck: Normal range of motion. Neck supple.  Cardiovascular: Normal rate, regular rhythm, normal heart sounds and intact distal pulses.   Pulmonary/Chest: Effort normal and breath sounds normal. No respiratory distress. She has no wheezes. She exhibits no tenderness.  Abdominal: Soft. Bowel sounds are normal. She exhibits no distension. There is tenderness in the suprapubic area. There is no rigidity, no rebound, no guarding and no CVA tenderness.  Musculoskeletal: Normal range of motion. She exhibits no edema.   Diffuse left paraspinal tenderness. No c-spine, t-spine, or l-spine tenderness.   Neurological: She is alert and oriented to person, place, and time. She has normal strength. No cranial nerve deficit or sensory deficit. Coordination and gait normal.  Skin: Skin is warm and dry.  Psychiatric: She has a normal mood and affect.  Nursing note and vitals reviewed.   ED Course  Procedures (including critical care time) Labs Review Labs Reviewed  BASIC METABOLIC PANEL - Abnormal; Notable for the following:    Glucose, Bld 184 (*)    All other components within normal limits  URINALYSIS, ROUTINE W REFLEX MICROSCOPIC (NOT AT [facility]) - Abnormal; Notable for the following:    APPearance HAZY (*)    Glucose, UA 100 (*)    All other components within normal limits  CBC WITH DIFFERENTIAL/PLATELET  LIPASE, BLOOD    Imaging Review No results found. I have personally reviewed and evaluated these images and lab results as part of my medical decision-making.   EKG Interpretation None      MDM   Final diagnoses:  Left-sided low back pain with left-sided sciatica    Given no new trauma/injury, nonfocal neuro exam, no spinal tenderness, there is no indication for imaging at this time. She has diffuse paraspinal tenderness. Abdomen is soft, no peritoneal signs. Ddx includes muscular strain/spasm vs possible UTI/pyelo/kidney stone given urinary symptoms, low back pain, and suprapubic tenderness. Given that pt recently took vicodin and valium, will try toradol and see how pt responds. CBC, BMP, UA, and lipase obtained though I have low suspicion for other intra-abdominal etiology.   Pt still in pain and tearful after toradol. Will give 57m morphine. Labs are pending.  Labs unremarkable. UA negative for infection, blood, crystals. Again, back pain likely muscular strain Pt reports relief with morphine. Upon re-eval she states she is actually out of the Norco and valium, which were prescribed to her  following rotator cuff surgery. Given the change in her story I am hesitant to give narcotics or strong pain medications. I discussed with pt that I can  give her a short course of tramadol and she may take OTC NSAIDs prn, but she will need to f/u with PCP for ongoing pain management. She is in agreement with plan. ER return precautions given.     Anne Ng, Hershal Coria 09/12/15 2114  Julianne Rice, MD 09/15/15 0800

## 2015-09-12 NOTE — ED Notes (Signed)
PA at the bedside.

## 2015-09-12 NOTE — Discharge Instructions (Signed)
You were seen in the emergency room today for back pain. Your labs and exam were unremarkable. I will give you a small prescription for Tramadol to take as needed for pain. You may continue taking the meloxicam, Flexeril, and gabapentin that you have at home. If you are taking meloxicam do not take any additional ibuprofen/naproxen. You may try using a heating pad to help relieve some of the muscle tension. Please call your primary care doctor within one week to for further evaluation and pain management. Return to the ER for new or worsening symptoms.

## 2015-09-18 ENCOUNTER — Other Ambulatory Visit: Payer: Self-pay | Admitting: *Deleted

## 2015-09-18 MED ORDER — ACCU-CHEK SOFTCLIX LANCET DEV MISC: Status: DC

## 2015-09-28 ENCOUNTER — Encounter: Payer: Self-pay | Admitting: Endocrinology

## 2015-09-28 ENCOUNTER — Ambulatory Visit (INDEPENDENT_AMBULATORY_CARE_PROVIDER_SITE_OTHER): Payer: Medicare Other | Admitting: Endocrinology

## 2015-09-28 VITALS — BP 136/88 | HR 89 | Temp 98.2°F | Ht 61.0 in | Wt 233.0 lb

## 2015-09-28 DIAGNOSIS — Z794 Long term (current) use of insulin: Secondary | ICD-10-CM

## 2015-09-28 DIAGNOSIS — E119 Type 2 diabetes mellitus without complications: Secondary | ICD-10-CM | POA: Diagnosis not present

## 2015-09-28 DIAGNOSIS — M6281 Muscle weakness (generalized): Secondary | ICD-10-CM | POA: Diagnosis not present

## 2015-09-28 DIAGNOSIS — M75122 Complete rotator cuff tear or rupture of left shoulder, not specified as traumatic: Secondary | ICD-10-CM | POA: Diagnosis not present

## 2015-09-28 DIAGNOSIS — M25512 Pain in left shoulder: Secondary | ICD-10-CM | POA: Diagnosis not present

## 2015-09-28 DIAGNOSIS — M25612 Stiffness of left shoulder, not elsewhere classified: Secondary | ICD-10-CM | POA: Diagnosis not present

## 2015-09-28 MED ORDER — INSULIN LISPRO 100 UNIT/ML (KWIKPEN): 15.0000 [IU] | PEN_INJECTOR | Freq: Three times a day (TID) | SUBCUTANEOUS | Status: DC

## 2015-09-28 NOTE — Progress Notes (Signed)
Subjective:    Patient ID: Brittney, Bradley, female    DOB: 17-Mar-1971, 45 y.o.   MRN: [id number]  HPI Pt returns for f/u of diabetes mellitus: DM type: Insulin-requiring type 2 Dx'ed: 2035 Complications: painful polyneuropathy Therapy: insulin since 2012 GDM: never DKA: never Severe hypoglycemia: never Pancreatitis: never Other: she takes qd insulin Interval history: no cbg record, but states cbg's vary from 116-300.  It is in general higher as the day goes on.  pt states she feels well in general. Past Medical History  Diagnosis Date  . Hypertension   . Hypokalemia   . Asthma   . DM (diabetes mellitus) ([place])     INSULIN DEPENDENT  . Depression   . GERD (gastroesophageal reflux disease)   . Headache(784.0)   . Chronic back pain   . Chronic chest pain   . Arthritis     Past Surgical History  Procedure Laterality Date  . Cesarean section      x 3  . Appendectomy    . Vesicovaginal fistula closure w/ tah  2009  . Hernia repair    . Cholecystectomy N/A 03/02/2014    Procedure: LAPAROSCOPIC CHOLECYSTECTOMY;  Surgeon: Joyice Faster. Cornett, MD;  Location: [facility];  Service: General;  Laterality: N/A;  . Rotator cuff repair      Social History   Social History  . Marital Status: Married    Spouse Name: N/A  . Number of Children: N/A  . Years of Education: N/A   Occupational History  . Not on file.   Social History Main Topics  . Smoking status: Former Smoker -- 0.30 packs/day for 22 years    Types: Cigarettes    Quit date: 05/01/2011  . Smokeless tobacco: Never Used  . Alcohol Use: 0.0 oz/week    0 Standard drinks or equivalent per week     Comment: rare  . Drug Use: No  . Sexual Activity:    Partners: Male   Other Topics Concern  . Not on file   Social History Narrative    Current Outpatient Prescriptions on File Prior to Visit  Medication Sig Dispense Refill  . albuterol (PROVENTIL HFA;VENTOLIN HFA) 108 (90 BASE) MCG/ACT inhaler Inhale 2  puffs into the lungs every 6 (six) hours as needed for wheezing or shortness of breath.    Marland Kitchen albuterol (PROVENTIL) (2.5 MG/3ML) 0.083% nebulizer solution Take 2.5 mg by nebulization every 6 (six) hours as needed for wheezing or shortness of breath.    Marland Kitchen amLODipine (NORVASC) 10 MG tablet Take 1 tablet (10 mg total) by mouth daily. 30 tablet 4  . aspirin EC 325 MG tablet Take 325 mg by mouth daily.    . Aspirin-Acetaminophen-Caffeine (GOODY HEADACHE PO) Take 1 Package by mouth daily as needed (for leg pain).    Marland Kitchen betamethasone valerate (VALISONE) 0.1 % cream Apply topically 2 (two) times daily. 30 g 0  . Blood Glucose Monitoring Suppl (ACCU-CHEK AVIVA PLUS) W/DEVICE KIT Use to check blood sugar 3 times per day. DX CODE: E11.9 1 kit 2  . Blood Glucose Monitoring Suppl (FREESTYLE LITE) DEVI Use to check blood sugar 3 times per day. Dx code: E11.9 1 each 2  . carvedilol (COREG) 3.125 MG tablet TAKE 1 TABLET BY MOUTH 2 TIMES DAILY WITH A MEAL. 60 tablet 5  . cyclobenzaprine (FLEXERIL) 5 MG tablet TAKE 1 TABLET BY MOUTH 3 TIMES DAILY AS NEEDED FOR MUSCLE SPASMS. 90 tablet 2  . dapagliflozin propanediol (FARXIGA) 5 MG TABS  tablet Take 5 mg by mouth daily. 30 tablet 4  . Dulaglutide (TRULICITY) 0.86 VH/8.4ON SOPN Inject 0.75 mg into the skin once a week. 4 pen 11  . Fluticasone-Salmeterol (ADVAIR) 250-50 MCG/DOSE AEPB Inhale 1 puff into the lungs 2 times daily at 12 noon and 4 pm. (Patient taking differently: Inhale 1 puff into the lungs daily as needed (shortness of breath). ) 60 each 0  . gabapentin (NEURONTIN) 300 MG capsule Take 1 capsule (300 mg total) by mouth 3 (three) times daily. 90 capsule 3  . glucose blood (ACCU-CHEK AVIVA PLUS) test strip Use to check blood sugar 3 times per day.DX Code E11.9 100 each 12  . hydrochlorothiazide (HYDRODIURIL) 25 MG tablet Take 1 tablet (25 mg total) by mouth daily. 30 tablet 4  . ipratropium (ATROVENT) 0.02 % nebulizer solution Take 0.5 mg by nebulization every 6  (six) hours as needed for wheezing or shortness of breath.     Elmore Guise Devices (ACCU-CHEK SOFTCLIX) lancets Use to check blood sugar 3 times per day dx code E11.9 100 each 2  . Lancets (ACCU-CHEK MULTICLIX) lancets Use to check blood sugar 3 times per day. DX code E11.9 100 each 12  . losartan (COZAAR) 100 MG tablet Take 1 tablet (100 mg total) by mouth daily. For Blood pressure 30 tablet 4  . meloxicam (MOBIC) 7.5 MG tablet Take 7.5 mg by mouth daily.    . ondansetron (ZOFRAN ODT) 4 MG disintegrating tablet Take 1 tablet (4 mg total) by mouth every 8 (eight) hours as needed for nausea. 6 tablet 0  . ondansetron (ZOFRAN ODT) 4 MG disintegrating tablet Take 1 tablet (4 mg total) by mouth every 8 (eight) hours as needed for nausea. 20 tablet 0  . sertraline (ZOLOFT) 50 MG tablet Take 1 tablet (50 mg total) by mouth daily. 30 tablet 2  . simvastatin (ZOCOR) 10 MG tablet Take 1 tablet (10 mg total) by mouth daily at 6 PM. 30 tablet 6  . SUMAtriptan (IMITREX) 100 MG tablet TAKE 1TAB AT EARLIEST ONSET OF HEADACHE. MAY REPEAT X1 IN 2 HOURS IF HEADACHE PERSISTS OR RECURS. DO NOT EXCEED 2 TABS IN 24HRS 9 tablet 0  . traMADol (ULTRAM) 50 MG tablet Take 1 tablet (50 mg total) by mouth every 6 (six) hours as needed. 15 tablet 0  . traMADol (ULTRAM) 50 MG tablet Take 1 tablet (50 mg total) by mouth every 6 (six) hours as needed. 15 tablet 0  . traMADol (ULTRAM) 50 MG tablet Take 1 tablet (50 mg total) by mouth every 6 (six) hours as needed. 10 tablet 0  . [DISCONTINUED] promethazine (PHENERGAN) 25 MG tablet Take 1 tablet (25 mg total) by mouth every 6 (six) hours as needed for nausea. (Patient not taking: Reported on 12/13/2014) 20 tablet 0   No current facility-administered medications on file prior to visit.    Allergies  Allergen Reactions  . Reglan [Metoclopramide] Other (See Comments)    Panic attack    Family History  Problem Relation Age of Onset  . Heart attack Mother   . Stroke Mother   .  Diabetes Mother   . Hypertension Mother   . Arthritis Mother   . Stroke Father   . Hypertension Sister   . Diabetes Sister   . Seizures Brother   . Diabetes Brother     BP 136/88 mmHg  Pulse 89  Temp(Src) 98.2 F (36.8 C) (Oral)  Ht _0  (1.549 m)  Wt 233 lb (105.688  kg)  BMI 44.05 kg/m2  SpO2 98%  Review of Systems She denies hypoglycemia    Objective:   Physical Exam VITAL SIGNS:  See vs page GENERAL: no distress SKIN:  Insulin injection sites at the anterior abdomen are normal.       Assessment & Plan:  DM: she needs increased rx.  We discussed rx options.  She agrees to take multiple daily injections.    Patient is advised the following: Patient Instructions  Please continue the same farxiga and trulicity, and: change the lantus to humalog 15 units 3 times a day (just before each meal).   Please come back for a follow-up appointment in 1 month.  check your blood sugar twice a day.  vary the time of day when you check, between before the 3 meals, and at bedtime.  also check if you have symptoms of your blood sugar being too high or too low.  please keep a record of the readings and bring it to your next appointment here (or you can bring the meter itself).  You can write it on any piece of paper.  please call us sooner if your blood sugar goes below 70, or if you have a lot of readings over 200.

## 2015-09-28 NOTE — Patient Instructions (Addendum)
Please continue the same farxiga and trulicity, and: change the lantus to humalog 15 units 3 times a day (just before each meal).   Please come back for a follow-up appointment in 1 month.  check your blood sugar twice a day.  vary the time of day when you check, between before the 3 meals, and at bedtime.  also check if you have symptoms of your blood sugar being too high or too low.  please keep a record of the readings and bring it to your next appointment here (or you can bring the meter itself).  You can write it on any piece of paper.  please call us sooner if your blood sugar goes below 70, or if you have a lot of readings over 200.

## 2015-10-05 DIAGNOSIS — M25612 Stiffness of left shoulder, not elsewhere classified: Secondary | ICD-10-CM | POA: Diagnosis not present

## 2015-10-05 DIAGNOSIS — M25512 Pain in left shoulder: Secondary | ICD-10-CM | POA: Diagnosis not present

## 2015-10-05 DIAGNOSIS — M75122 Complete rotator cuff tear or rupture of left shoulder, not specified as traumatic: Secondary | ICD-10-CM | POA: Diagnosis not present

## 2015-10-05 DIAGNOSIS — M6281 Muscle weakness (generalized): Secondary | ICD-10-CM | POA: Diagnosis not present

## 2015-10-12 ENCOUNTER — Emergency Department (HOSPITAL_COMMUNITY)
Admission: EM | Admit: 2015-10-12 | Discharge: 2015-10-12 | Disposition: A | Discharge status: Home / Self Care | Payer: Medicare Other | Attending: Emergency Medicine | Admitting: Emergency Medicine

## 2015-10-12 ENCOUNTER — Emergency Department (HOSPITAL_COMMUNITY): Payer: Medicare Other

## 2015-10-12 ENCOUNTER — Encounter (HOSPITAL_COMMUNITY): Payer: Self-pay | Admitting: Emergency Medicine

## 2015-10-12 DIAGNOSIS — F17210 Nicotine dependence, cigarettes, uncomplicated: Secondary | ICD-10-CM | POA: Diagnosis not present

## 2015-10-12 DIAGNOSIS — E119 Type 2 diabetes mellitus without complications: Secondary | ICD-10-CM | POA: Insufficient documentation

## 2015-10-12 DIAGNOSIS — Z794 Long term (current) use of insulin: Secondary | ICD-10-CM | POA: Diagnosis not present

## 2015-10-12 DIAGNOSIS — F329 Major depressive disorder, single episode, unspecified: Secondary | ICD-10-CM | POA: Diagnosis not present

## 2015-10-12 DIAGNOSIS — M1990 Unspecified osteoarthritis, unspecified site: Secondary | ICD-10-CM | POA: Insufficient documentation

## 2015-10-12 DIAGNOSIS — Z791 Long term (current) use of non-steroidal anti-inflammatories (NSAID): Secondary | ICD-10-CM | POA: Insufficient documentation

## 2015-10-12 DIAGNOSIS — R1030 Lower abdominal pain, unspecified: Secondary | ICD-10-CM | POA: Diagnosis not present

## 2015-10-12 DIAGNOSIS — J45909 Unspecified asthma, uncomplicated: Secondary | ICD-10-CM | POA: Diagnosis not present

## 2015-10-12 DIAGNOSIS — R1033 Periumbilical pain: Secondary | ICD-10-CM | POA: Diagnosis not present

## 2015-10-12 DIAGNOSIS — I10 Essential (primary) hypertension: Secondary | ICD-10-CM | POA: Insufficient documentation

## 2015-10-12 DIAGNOSIS — R197 Diarrhea, unspecified: Secondary | ICD-10-CM | POA: Insufficient documentation

## 2015-10-12 DIAGNOSIS — K5900 Constipation, unspecified: Secondary | ICD-10-CM | POA: Diagnosis not present

## 2015-10-12 DIAGNOSIS — Z79899 Other long term (current) drug therapy: Secondary | ICD-10-CM | POA: Insufficient documentation

## 2015-10-12 DIAGNOSIS — G8929 Other chronic pain: Secondary | ICD-10-CM | POA: Insufficient documentation

## 2015-10-12 DIAGNOSIS — Z7982 Long term (current) use of aspirin: Secondary | ICD-10-CM | POA: Insufficient documentation

## 2015-10-12 DIAGNOSIS — R198 Other specified symptoms and signs involving the digestive system and abdomen: Secondary | ICD-10-CM

## 2015-10-12 LAB — COMPREHENSIVE METABOLIC PANEL
ALBUMIN: 3.5 g/dL (ref 3.5–5.0)
ALT: 17 U/L (ref 14–54)
AST: 17 U/L (ref 15–41)
Alkaline Phosphatase: 78 U/L (ref 38–126)
Anion gap: 9 (ref 5–15)
BILIRUBIN TOTAL: 0.4 mg/dL (ref 0.3–1.2)
BUN: 7 mg/dL (ref 6–20)
CHLORIDE: 108 mmol/L (ref 101–111)
CO2: 24 mmol/L (ref 22–32)
CREATININE: 1.11 mg/dL — AB (ref 0.44–1.00)
Calcium: 9.6 mg/dL (ref 8.9–10.3)
GFR calc Af Amer: >60 mL/min (ref >60)
GFR, EST NON AFRICAN AMERICAN: 59 mL/min — AB (ref >60)
GLUCOSE: 114 mg/dL — AB (ref 65–99)
Potassium: 4.2 mmol/L (ref 3.5–5.1)
Sodium: 141 mmol/L (ref 135–145)
TOTAL PROTEIN: 7.2 g/dL (ref 6.5–8.1)

## 2015-10-12 LAB — WET PREP, GENITAL
SPERM: NONE SEEN
Trich, Wet Prep: NONE SEEN
YEAST WET PREP: NONE SEEN

## 2015-10-12 LAB — CBC
HEMATOCRIT: 37.3 % (ref 36.0–46.0)
Hemoglobin: 12.8 g/dL (ref 12.0–15.0)
MCH: 29.4 pg (ref 26.0–34.0)
MCHC: 34.3 g/dL (ref 30.0–36.0)
MCV: 85.6 fL (ref 78.0–100.0)
PLATELETS: 313 10*3/uL (ref 150–400)
RBC: 4.36 MIL/uL (ref 3.87–5.11)
RDW: 14 % (ref 11.5–15.5)
WBC: 8.5 10*3/uL (ref 4.0–10.5)

## 2015-10-12 LAB — URINALYSIS, ROUTINE W REFLEX MICROSCOPIC
BILIRUBIN URINE: NEGATIVE
GLUCOSE, UA: NEGATIVE mg/dL
Hgb urine dipstick: NEGATIVE
KETONES UR: NEGATIVE mg/dL
LEUKOCYTES UA: NEGATIVE
Nitrite: NEGATIVE
PH: 6 (ref 5.0–8.0)
PROTEIN: NEGATIVE mg/dL
Specific Gravity, Urine: 1.015 (ref 1.005–1.030)

## 2015-10-12 LAB — I-STAT BETA HCG BLOOD, ED (MC, WL, AP ONLY): I-stat hCG, quantitative: <5 m[IU]/mL (ref <5)

## 2015-10-12 LAB — LIPASE, BLOOD: Lipase: 30 U/L (ref 11–51)

## 2015-10-12 MED ORDER — IOHEXOL 300 MG/ML  SOLN
100.0000 mL | Freq: Once | INTRAMUSCULAR | Status: AC | PRN
  Administered 2015-10-12: 100 mL via INTRAVENOUS

## 2015-10-12 MED ORDER — DOCUSATE SODIUM 100 MG PO CAPS: 100.0000 mg | ORAL_CAPSULE | Freq: Two times a day (BID) | ORAL | Status: DC

## 2015-10-12 MED ORDER — KETOROLAC TROMETHAMINE 30 MG/ML IJ SOLN: 30.0000 mg | Freq: Once | INTRAMUSCULAR | Status: DC

## 2015-10-12 MED ORDER — SODIUM CHLORIDE 0.9 % IV BOLUS (SEPSIS)
1000.0000 mL | Freq: Once | INTRAVENOUS | Status: AC
  Administered 2015-10-12: 1000 mL via INTRAVENOUS

## 2015-10-12 MED ORDER — KETOROLAC TROMETHAMINE 30 MG/ML IJ SOLN
15.0000 mg | Freq: Once | INTRAMUSCULAR | Status: AC
  Administered 2015-10-12: 15 mg via INTRAVENOUS
  Filled 2015-10-12: qty 1

## 2015-10-12 MED ORDER — IBUPROFEN 800 MG PO TABS: 800.0000 mg | ORAL_TABLET | Freq: Three times a day (TID) | ORAL | Status: DC

## 2015-10-12 MED ORDER — FENTANYL CITRATE (PF) 100 MCG/2ML IJ SOLN
50.0000 ug | INTRAMUSCULAR | Status: DC | PRN
  Administered 2015-10-12 (×2): 50 ug via INTRAVENOUS
  Filled 2015-10-12 (×2): qty 2

## 2015-10-12 NOTE — ED Notes (Signed)
Patient concerned that this may be a miscarriage, not having any bleeding.   Patient states she read on google that it could be same.   Istat HCG to rule out per patient request.

## 2015-10-12 NOTE — ED Provider Notes (Signed)
CSN: [id number]     Arrival date & time 10/12/15  1749 History   First MD Initiated Contact with Patient 10/12/15 [phone]     Chief Complaint  Patient presents with  . Abdominal Pain     (Consider location/radiation/quality/duration/timing/severity/associated sxs/prior Treatment) Patient is a 45 y.o. female presenting with abdominal pain. The history is provided by the patient.  Abdominal Pain Pain location:  Periumbilical Pain quality: pressure and sharp   Pain radiates to:  Groin and anus Pain severity:  Moderate Onset quality:  Gradual Duration:  2 days Timing:  Constant Progression:  Unchanged Chronicity:  New Context: not trauma   Relieved by:  Nothing Worsened by:  Nothing tried Ineffective treatments:  None tried Associated symptoms: diarrhea (some initially)   Associated symptoms: no constipation, no fever, no hematuria, no vaginal bleeding, no vaginal discharge and no vomiting   Risk factors: multiple surgeries and obesity   Risk factors: no alcohol abuse and not pregnant     Past Medical History  Diagnosis Date  . Hypertension   . Hypokalemia   . Asthma   . DM (diabetes mellitus) ([place])     INSULIN DEPENDENT  . Depression   . GERD (gastroesophageal reflux disease)   . Headache(784.0)   . Chronic back pain   . Chronic chest pain   . Arthritis    Past Surgical History  Procedure Laterality Date  . Cesarean section      x 3  . Appendectomy    . Vesicovaginal fistula closure w/ tah  2009  . Hernia repair    . Cholecystectomy N/A 03/02/2014    Procedure: LAPAROSCOPIC CHOLECYSTECTOMY;  Surgeon: Joyice Faster. Cornett, MD;  Location: [facility];  Service: General;  Laterality: N/A;  . Rotator cuff repair     Family History  Problem Relation Age of Onset  . Heart attack Mother   . Stroke Mother   . Diabetes Mother   . Hypertension Mother   . Arthritis Mother   . Stroke Father   . Hypertension Sister   . Diabetes Sister   . Seizures Brother   . Diabetes Brother     Social History  Substance Use Topics  . Smoking status: Current Every Day Smoker -- 0.25 packs/day for 22 years    Types: Cigarettes  . Smokeless tobacco: Never Used  . Alcohol Use: 0.0 oz/week    0 Standard drinks or equivalent per week     Comment: socially   OB History    No data available     Review of Systems  Constitutional: Negative for fever.  Gastrointestinal: Positive for abdominal pain and diarrhea (some initially). Negative for vomiting and constipation.  Genitourinary: Negative for hematuria, vaginal bleeding and vaginal discharge.  All other systems reviewed and are negative.     Allergies  Reglan  Home Medications   Prior to Admission medications   Medication Sig Start Date End Date Taking? Authorizing Provider  albuterol (PROVENTIL HFA;VENTOLIN HFA) 108 (90 BASE) MCG/ACT inhaler Inhale 2 puffs into the lungs every 6 (six) hours as needed for wheezing or shortness of breath.    Historical Provider, MD  albuterol (PROVENTIL) (2.5 MG/3ML) 0.083% nebulizer solution Take 2.5 mg by nebulization every 6 (six) hours as needed for wheezing or shortness of breath.    Historical Provider, MD  amLODipine (NORVASC) 10 MG tablet Take 1 tablet (10 mg total) by mouth daily. 08/01/15   Lance Bosch, NP  aspirin EC 325 MG tablet Take 325 mg  by mouth daily.    Historical Provider, MD  Aspirin-Acetaminophen-Caffeine (GOODY HEADACHE PO) Take 1 Package by mouth daily as needed (for leg pain).    Historical Provider, MD  Blood Glucose Monitoring Suppl (ACCU-CHEK AVIVA PLUS) W/DEVICE KIT Use to check blood sugar 3 times per day. DX CODE: E11.9 08/24/15   Renato Shin, MD  Blood Glucose Monitoring Suppl (FREESTYLE LITE) DEVI Use to check blood sugar 3 times per day. Dx code: E11.9 08/22/15   Renato Shin, MD  carvedilol (COREG) 3.125 MG tablet TAKE 1 TABLET BY MOUTH 2 TIMES DAILY WITH A MEAL. 04/06/15   Lance Bosch, NP  dapagliflozin propanediol (FARXIGA) 5 MG TABS tablet Take 5 mg  by mouth daily. 08/13/15   Renato Shin, MD  Dulaglutide (TRULICITY) 3.70 WU/8.8BV SOPN Inject 0.75 mg into the skin once a week. 08/13/15   Renato Shin, MD  Fluticasone-Salmeterol (ADVAIR) 250-50 MCG/DOSE AEPB Inhale 1 puff into the lungs 2 times daily at 12 noon and 4 pm. Patient taking differently: Inhale 1 puff into the lungs daily as needed (shortness of breath).  06/06/14   Carly Montey Hora, MD  gabapentin (NEURONTIN) 300 MG capsule Take 1 capsule (300 mg total) by mouth 3 (three) times daily. 04/06/15   Lance Bosch, NP  glucose blood (ACCU-CHEK AVIVA PLUS) test strip Use to check blood sugar 3 times per day.DX Code E11.9 08/24/15   Renato Shin, MD  hydrochlorothiazide (HYDRODIURIL) 25 MG tablet Take 1 tablet (25 mg total) by mouth daily. 08/01/15   Lance Bosch, NP  insulin lispro (HUMALOG KWIKPEN) 100 UNIT/ML KiwkPen Inject 0.15 mLs (15 Units total) into the skin 3 (three) times daily with meals. And pen needles 3/day 09/28/15   Renato Shin, MD  ipratropium (ATROVENT) 0.02 % nebulizer solution Take 0.5 mg by nebulization every 6 (six) hours as needed for wheezing or shortness of breath.     Historical Provider, MD  Lancet Devices [facility]) lancets Use to check blood sugar 3 times per day dx code E11.9 09/18/15   Elayne Snare, MD  Lancets (ACCU-CHEK MULTICLIX) lancets Use to check blood sugar 3 times per day. DX code E11.9 08/24/15   Renato Shin, MD  losartan (COZAAR) 100 MG tablet Take 1 tablet (100 mg total) by mouth daily. For Blood pressure 08/01/15   Lance Bosch, NP  meloxicam (MOBIC) 7.5 MG tablet Take 7.5 mg by mouth daily.    Historical Provider, MD  sertraline (ZOLOFT) 50 MG tablet Take 1 tablet (50 mg total) by mouth daily. 04/06/15   Lance Bosch, NP  simvastatin (ZOCOR) 10 MG tablet Take 1 tablet (10 mg total) by mouth daily at 6 PM. 08/01/15   Lance Bosch, NP  SUMAtriptan (IMITREX) 100 MG tablet TAKE 1TAB AT EARLIEST ONSET OF HEADACHE. MAY REPEAT X1 IN 2 HOURS IF  HEADACHE PERSISTS OR RECURS. DO NOT EXCEED 2 TABS IN 24HRS 04/06/15   Pieter Partridge, DO  traMADol (ULTRAM) 50 MG tablet Take 1 tablet (50 mg total) by mouth every 6 (six) hours as needed. 08/22/15   Tanna Furry, MD  traMADol (ULTRAM) 50 MG tablet Take 1 tablet (50 mg total) by mouth every 6 (six) hours as needed. 08/22/15   Tanna Furry, MD  traMADol (ULTRAM) 50 MG tablet Take 1 tablet (50 mg total) by mouth every 6 (six) hours as needed. 09/12/15   Olivia Canter Sam, PA-C   BP 128/95 mmHg  Pulse 108  Temp(Src) 98 F (36.7 C) (  Oral)  Resp 22  Ht 5' 1"  (1.549 m)  Wt 231 lb (104.781 kg)  BMI 43.67 kg/m2  SpO2 98% Physical Exam  Constitutional: She is oriented to person, place, and time. She appears well-developed and well-nourished. No distress.  HENT:  Head: Normocephalic.  Eyes: Conjunctivae are normal.  Neck: Neck supple. No tracheal deviation present.  Cardiovascular: Normal rate and regular rhythm.   Pulmonary/Chest: Effort normal. No respiratory distress.  Abdominal: Soft. She exhibits no distension. There is tenderness (diffuse lower).  Genitourinary: Vagina normal. Cervix exhibits no motion tenderness and no discharge. Right adnexum displays no mass and no tenderness. Left adnexum displays no mass and no tenderness.  Neurological: She is alert and oriented to person, place, and time.  Skin: Skin is warm and dry.  Psychiatric: She has a normal mood and affect.  Vitals reviewed.   ED Course  Procedures (including critical care time) Labs Review Labs Reviewed  WET PREP, GENITAL - Abnormal; Notable for the following:    Clue Cells Wet Prep HPF POC PRESENT (*)    WBC, Wet Prep HPF POC FEW (*)    All other components within normal limits  COMPREHENSIVE METABOLIC PANEL - Abnormal; Notable for the following:    Glucose, Bld 114 (*)    Creatinine, Ser 1.11 (*)    GFR calc non Af Amer 59 (*)    All other components within normal limits  LIPASE, BLOOD  CBC  URINALYSIS, ROUTINE W REFLEX  MICROSCOPIC (NOT AT [facility])  I-STAT BETA HCG BLOOD, ED ([facility], [facility], [facility] ONLY)  GC/CHLAMYDIA PROBE AMP ([place]) NOT AT [facility]    Imaging Review Ct Abdomen Pelvis W Contrast  10/12/2015  CLINICAL DATA:  Low abdominal pain starting Wednesday with nausea and diarrhea. EXAM: CT ABDOMEN AND PELVIS WITH CONTRAST TECHNIQUE: Multidetector CT imaging of the abdomen and pelvis was performed using the standard protocol following bolus administration of intravenous contrast. CONTRAST:  18m OMNIPAQUE IOHEXOL 300 MG/ML  SOLN COMPARISON:  11/10/2013 FINDINGS: Lower chest and abdominal wall: Laparotomy scar with upper incisional laxity of the abdominal wall. Hepatobiliary: Hepatic steatosis that is patchy.Cholecystectomy with normal common bile duct diameter. Pancreas: Unremarkable. Spleen: Unremarkable. Adrenals/Urinary Tract: Negative adrenals. No hydronephrosis or stone. Unremarkable bladder. Reproductive:Hysterectomy with negative adnexa. Stomach/Bowel: No obstruction. Appendectomy. No inflammatory wall thickening. Vascular/Lymphatic: No acute vascular abnormality. No mass or adenopathy. Peritoneal: No ascites or pneumoperitoneum. Musculoskeletal: No acute finding. Lower lumbar facet arthropathy. Lumbar disc bulging most notably at L4-5. IMPRESSION: 1. No explanation for acute pain. 2. Hepatic steatosis. Electronically Signed   By: JMonte FantasiaM.D.   On: 10/12/2015 12:58   I have personally reviewed and evaluated these images and lab results as part of my medical decision-making.   EKG Interpretation None      MDM   Final diagnoses:  Lower abdominal pain  Painful defecation   45y.o. female presents with lower abdominal pain ongoing last 2 days that has worsened. No improvement with toradol, minimal improvement with fentanyl. nonfocal lower pain described as pressure of labor pains. Noted she has pain with defection initially. CT ordered to r/o emergent cause of refractory pain. No acute pathology  identified on scan. Dicussed possibility of ectopic uterine tissue as etiology of pain that we may not be able to identify given its location. Provided stool softeners. Recommended close f/u with Gynecology for re-evaluation, GC/Chlamydia pending but no indication for empiric treatment as Pt has no dc. Will schedule NSAIDs and continue to monitor symptoms on  an OP basis. Plan to follow up with PCP as needed and return precautions discussed for worsening or new concerning symptoms.     Leo Grosser, MD 10/12/15 [phone]

## 2015-10-12 NOTE — ED Notes (Signed)
Patient states lower abdominal pain and low back pain that she has had since Wednesday.   Patient states "it goes into my uterus.   It's like Im in labor".   Patient states no way she can be pregnant, she has had partial hysterectomy.   Patient states has had some nausea, but no vomiting.   Has had some loose stools.

## 2015-10-12 NOTE — Discharge Instructions (Signed)

## 2015-10-15 ENCOUNTER — Telehealth: Payer: Self-pay | Admitting: Internal Medicine

## 2015-10-15 DIAGNOSIS — M5431 Sciatica, right side: Secondary | ICD-10-CM | POA: Diagnosis not present

## 2015-10-15 DIAGNOSIS — M75122 Complete rotator cuff tear or rupture of left shoulder, not specified as traumatic: Secondary | ICD-10-CM | POA: Diagnosis not present

## 2015-10-15 LAB — GC/CHLAMYDIA PROBE AMP (~~LOC~~) NOT AT ARMC
CHLAMYDIA, DNA PROBE: NEGATIVE
Neisseria Gonorrhea: NEGATIVE

## 2015-10-15 NOTE — Telephone Encounter (Signed)
Patient called requesting to speak to nurse regarding abdominal pain, please f/u with patient

## 2015-10-24 ENCOUNTER — Inpatient Hospital Stay: Payer: Medicare Other | Admitting: Internal Medicine

## 2015-10-29 ENCOUNTER — Encounter: Payer: Self-pay | Admitting: Internal Medicine

## 2015-10-29 ENCOUNTER — Other Ambulatory Visit: Payer: Self-pay

## 2015-10-29 ENCOUNTER — Ambulatory Visit (INDEPENDENT_AMBULATORY_CARE_PROVIDER_SITE_OTHER): Payer: Medicare Other | Admitting: Endocrinology

## 2015-10-29 ENCOUNTER — Encounter: Payer: Self-pay | Admitting: Endocrinology

## 2015-10-29 ENCOUNTER — Ambulatory Visit: Payer: Medicare Other | Attending: Internal Medicine | Admitting: Internal Medicine

## 2015-10-29 VITALS — BP 130/82 | HR 100 | Temp 98.0°F | Resp 17 | Ht 61.0 in | Wt 230.0 lb

## 2015-10-29 DIAGNOSIS — E119 Type 2 diabetes mellitus without complications: Secondary | ICD-10-CM

## 2015-10-29 DIAGNOSIS — Z79899 Other long term (current) drug therapy: Secondary | ICD-10-CM | POA: Diagnosis not present

## 2015-10-29 DIAGNOSIS — R1033 Periumbilical pain: Secondary | ICD-10-CM | POA: Insufficient documentation

## 2015-10-29 DIAGNOSIS — F329 Major depressive disorder, single episode, unspecified: Secondary | ICD-10-CM | POA: Diagnosis not present

## 2015-10-29 DIAGNOSIS — I10 Essential (primary) hypertension: Secondary | ICD-10-CM | POA: Insufficient documentation

## 2015-10-29 DIAGNOSIS — Z794 Long term (current) use of insulin: Secondary | ICD-10-CM | POA: Diagnosis not present

## 2015-10-29 DIAGNOSIS — E876 Hypokalemia: Secondary | ICD-10-CM | POA: Diagnosis not present

## 2015-10-29 DIAGNOSIS — Z7982 Long term (current) use of aspirin: Secondary | ICD-10-CM | POA: Insufficient documentation

## 2015-10-29 DIAGNOSIS — J45909 Unspecified asthma, uncomplicated: Secondary | ICD-10-CM | POA: Insufficient documentation

## 2015-10-29 DIAGNOSIS — F17210 Nicotine dependence, cigarettes, uncomplicated: Secondary | ICD-10-CM | POA: Insufficient documentation

## 2015-10-29 DIAGNOSIS — G8929 Other chronic pain: Secondary | ICD-10-CM | POA: Insufficient documentation

## 2015-10-29 DIAGNOSIS — M1990 Unspecified osteoarthritis, unspecified site: Secondary | ICD-10-CM | POA: Diagnosis not present

## 2015-10-29 DIAGNOSIS — Z888 Allergy status to other drugs, medicaments and biological substances status: Secondary | ICD-10-CM | POA: Insufficient documentation

## 2015-10-29 DIAGNOSIS — K219 Gastro-esophageal reflux disease without esophagitis: Secondary | ICD-10-CM | POA: Diagnosis not present

## 2015-10-29 LAB — POCT URINALYSIS DIPSTICK
Bilirubin, UA: NEGATIVE
Blood, UA: NEGATIVE
Glucose, UA: 500
KETONES UA: NEGATIVE
Leukocytes, UA: NEGATIVE
Nitrite, UA: NEGATIVE
PH UA: 5.5
UROBILINOGEN UA: 0.2

## 2015-10-29 LAB — GLUCOSE, POCT (MANUAL RESULT ENTRY): POC GLUCOSE: 201 mg/dL — AB (ref 70–99)

## 2015-10-29 LAB — POCT GLYCOSYLATED HEMOGLOBIN (HGB A1C): Hemoglobin A1C: 7.2

## 2015-10-29 MED ORDER — INSULIN PEN NEEDLE 32G X 4 MM MISC: Status: DC

## 2015-10-29 MED ORDER — INSULIN LISPRO 100 UNIT/ML (KWIKPEN): PEN_INJECTOR | SUBCUTANEOUS | Status: DC

## 2015-10-29 NOTE — Patient Instructions (Signed)
I have switched you to go to GI. They will exam you and determine the problem

## 2015-10-29 NOTE — Progress Notes (Signed)
Subjective:    Patient ID: [name], female    DOB: [birth year], 45 y.o.   MRN: [id number]  HPI Pt returns for f/u of diabetes mellitus: DM type: Insulin-requiring type 2 Dx'ed: 3710 Complications: painful polyneuropathy Therapy: insulin (since 6269), trulicity, and farxiga GDM: never DKA: never Severe hypoglycemia: never Pancreatitis: never Other: she takes multiple daily injections; she declines weight loss surgery Interval history: no cbg record, but states cbg's vary from 101-169.  It is in general highest at HS, and lowest at lunch.  pt states she feels well in general.   Past Medical History  Diagnosis Date  . Hypertension   . Hypokalemia   . Asthma   . DM (diabetes mellitus) ([place])     INSULIN DEPENDENT  . Depression   . GERD (gastroesophageal reflux disease)   . Headache(784.0)   . Chronic back pain   . Chronic chest pain   . Arthritis     Past Surgical History  Procedure Laterality Date  . Cesarean section      x 3  . Appendectomy    . Vesicovaginal fistula closure w/ tah  2009  . Hernia repair    . Cholecystectomy N/A 03/02/2014    Procedure: LAPAROSCOPIC CHOLECYSTECTOMY;  Surgeon: Joyice Faster. Cornett, MD;  Location: [facility];  Service: General;  Laterality: N/A;  . Rotator cuff repair      Social History   Social History  . Marital Status: Married    Spouse Name: N/A  . Number of Children: N/A  . Years of Education: N/A   Occupational History  . Not on file.   Social History Main Topics  . Smoking status: Current Every Day Smoker -- 0.25 packs/day for 22 years    Types: Cigarettes  . Smokeless tobacco: Never Used  . Alcohol Use: 0.0 oz/week    0 Standard drinks or equivalent per week     Comment: socially  . Drug Use: No  . Sexual Activity:    Partners: Male   Other Topics Concern  . Not on file   Social History Narrative    Current Outpatient Prescriptions on File Prior to Visit  Medication Sig Dispense Refill  .  albuterol (PROVENTIL HFA;VENTOLIN HFA) 108 (90 BASE) MCG/ACT inhaler Inhale 2 puffs into the lungs every 6 (six) hours as needed for wheezing or shortness of breath.    Marland Kitchen albuterol (PROVENTIL) (2.5 MG/3ML) 0.083% nebulizer solution Take 2.5 mg by nebulization every 6 (six) hours as needed for wheezing or shortness of breath.    Marland Kitchen amLODipine (NORVASC) 10 MG tablet Take 1 tablet (10 mg total) by mouth daily. 30 tablet 4  . aspirin EC 325 MG tablet Take 325 mg by mouth daily.    . Aspirin-Acetaminophen-Caffeine (GOODY HEADACHE PO) Take 1 Package by mouth daily as needed (for leg pain).    . Blood Glucose Monitoring Suppl (ACCU-CHEK AVIVA PLUS) W/DEVICE KIT Use to check blood sugar 3 times per day. DX CODE: E11.9 1 kit 2  . Blood Glucose Monitoring Suppl (FREESTYLE LITE) DEVI Use to check blood sugar 3 times per day. Dx code: E11.9 1 each 2  . carvedilol (COREG) 3.125 MG tablet TAKE 1 TABLET BY MOUTH 2 TIMES DAILY WITH A MEAL. 60 tablet 5  . dapagliflozin propanediol (FARXIGA) 5 MG TABS tablet Take 5 mg by mouth daily. 30 tablet 4  . docusate sodium (COLACE) 100 MG capsule Take 1 capsule (100 mg total) by mouth every 12 (twelve) hours. [place]  capsule 0  . Dulaglutide (TRULICITY) 8.89 VQ/9.4HW SOPN Inject 0.75 mg into the skin once a week. 4 pen 11  . Fluticasone-Salmeterol (ADVAIR) 250-50 MCG/DOSE AEPB Inhale 1 puff into the lungs 2 times daily at 12 noon and 4 pm. (Patient taking differently: Inhale 1 puff into the lungs daily as needed (shortness of breath). ) 60 each 0  . gabapentin (NEURONTIN) 300 MG capsule Take 1 capsule (300 mg total) by mouth 3 (three) times daily. 90 capsule 3  . glucose blood (ACCU-CHEK AVIVA PLUS) test strip Use to check blood sugar 3 times per day.DX Code E11.9 100 each 12  . hydrochlorothiazide (HYDRODIURIL) 25 MG tablet Take 1 tablet (25 mg total) by mouth daily. 30 tablet 4  . ibuprofen (ADVIL,MOTRIN) 800 MG tablet Take 1 tablet (800 mg total) by mouth 3 (three) times daily. 21  tablet 0  . ipratropium (ATROVENT) 0.02 % nebulizer solution Take 0.5 mg by nebulization every 6 (six) hours as needed for wheezing or shortness of breath.     Elmore Guise Devices (ACCU-CHEK SOFTCLIX) lancets Use to check blood sugar 3 times per day dx code E11.9 100 each 2  . Lancets (ACCU-CHEK MULTICLIX) lancets Use to check blood sugar 3 times per day. DX code E11.9 100 each 12  . losartan (COZAAR) 100 MG tablet Take 1 tablet (100 mg total) by mouth daily. For Blood pressure 30 tablet 4  . meloxicam (MOBIC) 7.5 MG tablet Take 7.5 mg by mouth daily.    . sertraline (ZOLOFT) 50 MG tablet Take 1 tablet (50 mg total) by mouth daily. 30 tablet 2  . simvastatin (ZOCOR) 10 MG tablet Take 1 tablet (10 mg total) by mouth daily at 6 PM. 30 tablet 6  . SUMAtriptan (IMITREX) 100 MG tablet TAKE 1TAB AT EARLIEST ONSET OF HEADACHE. MAY REPEAT X1 IN 2 HOURS IF HEADACHE PERSISTS OR RECURS. DO NOT EXCEED 2 TABS IN 24HRS 9 tablet 0  . [DISCONTINUED] promethazine (PHENERGAN) 25 MG tablet Take 1 tablet (25 mg total) by mouth every 6 (six) hours as needed for nausea. (Patient not taking: Reported on 12/13/2014) 20 tablet 0   No current facility-administered medications on file prior to visit.    Allergies  Allergen Reactions  . Reglan [Metoclopramide] Other (See Comments)    Panic attack    Family History  Problem Relation Age of Onset  . Heart attack Mother   . Stroke Mother   . Diabetes Mother   . Hypertension Mother   . Arthritis Mother   . Stroke Father   . Hypertension Sister   . Diabetes Sister   . Seizures Brother   . Diabetes Brother     BP 126/96 mmHg  Pulse 104  Temp(Src) 98.6 F (37 C) (Oral)  Ht 5' 1"  (1.549 m)  Wt 229 lb (103.874 kg)  BMI 43.29 kg/m2  SpO2 97%  Review of Systems She denies hypoglycemia    Objective:   Physical Exam VITAL SIGNS:  See vs page GENERAL: no distress Pulses: dorsalis pedis intact bilat.   MSK: no deformity of the feet CV: no leg edema Skin:  no  ulcer on the feet.  normal color and temp on the feet. Neuro: sensation is intact to touch on the feet.  A1c=7.2%    Assessment & Plan:  DM: she needs increased rx, if it can be done with a regimen that avoids or minimizes hypoglycemia.   Patient is advised the following: Patient Instructions  Please continue the same  farxiga and trulicity, and: Increase the humalog to 3 times a day (just before each meal), 15-15-20 units.   Please come back for a follow-up appointment in 3-4 months.  check your blood sugar twice a day.  vary the time of day when you check, between before the 3 meals, and at bedtime.  also check if you have symptoms of your blood sugar being too high or too low.  please keep a record of the readings and bring it to your next appointment here (or you can bring the meter itself).  You can write it on any piece of paper.  please call us sooner if your blood sugar goes below 70, or if you have a lot of readings over 200.

## 2015-10-29 NOTE — Progress Notes (Signed)
Patient ID: [name], female   DOB: [birth year], 45 y.o.   MRN: [id number]  CC: abdominal pain  HPI: [name] is a 45 y.o. female here today for a follow up visit.  Patient has past medical history of diabetes, asthma, depression, GERD. Patient was seen in the ER two weeks ago for abdominal pain. Patient states that her pain is periumbilical. Pain is aggravated by urination and sometimes defecation. She has a pressure in her abdomen that feels like "stones" or bloating. She has noticed some changes in stool that are more frequent than usual. She has never had a colonoscopy. She feels better after a bowel movement.   Allergies  Allergen Reactions  . Reglan [Metoclopramide] Other (See Comments)    Panic attack   Past Medical History  Diagnosis Date  . Hypertension   . Hypokalemia   . Asthma   . DM (diabetes mellitus) ([place])     INSULIN DEPENDENT  . Depression   . GERD (gastroesophageal reflux disease)   . Headache(784.0)   . Chronic back pain   . Chronic chest pain   . Arthritis    Current Outpatient Prescriptions on File Prior to Visit  Medication Sig Dispense Refill  . albuterol (PROVENTIL HFA;VENTOLIN HFA) 108 (90 BASE) MCG/ACT inhaler Inhale 2 puffs into the lungs every 6 (six) hours as needed for wheezing or shortness of breath.    Marland Kitchen albuterol (PROVENTIL) (2.5 MG/3ML) 0.083% nebulizer solution Take 2.5 mg by nebulization every 6 (six) hours as needed for wheezing or shortness of breath.    Marland Kitchen amLODipine (NORVASC) 10 MG tablet Take 1 tablet (10 mg total) by mouth daily. 30 tablet 4  . aspirin EC 325 MG tablet Take 325 mg by mouth daily.    . Aspirin-Acetaminophen-Caffeine (GOODY HEADACHE PO) Take 1 Package by mouth daily as needed (for leg pain).    . carvedilol (COREG) 3.125 MG tablet TAKE 1 TABLET BY MOUTH 2 TIMES DAILY WITH A MEAL. 60 tablet 5  . dapagliflozin propanediol (FARXIGA) 5 MG TABS tablet Take 5 mg by mouth daily. 30 tablet 4  . docusate  sodium (COLACE) 100 MG capsule Take 1 capsule (100 mg total) by mouth every 12 (twelve) hours. 60 capsule 0  . Dulaglutide (TRULICITY) 4.91 PH/1.5AV SOPN Inject 0.75 mg into the skin once a week. 4 pen 11  . gabapentin (NEURONTIN) 300 MG capsule Take 1 capsule (300 mg total) by mouth 3 (three) times daily. 90 capsule 3  . hydrochlorothiazide (HYDRODIURIL) 25 MG tablet Take 1 tablet (25 mg total) by mouth daily. 30 tablet 4  . ibuprofen (ADVIL,MOTRIN) 800 MG tablet Take 1 tablet (800 mg total) by mouth 3 (three) times daily. 21 tablet 0  . insulin lispro (HUMALOG KWIKPEN) 100 UNIT/ML KiwkPen 3 times a day (just before each meal), 15-15-20 units, and pen needles 3/day 30 mL 11  . ipratropium (ATROVENT) 0.02 % nebulizer solution Take 0.5 mg by nebulization every 6 (six) hours as needed for wheezing or shortness of breath.     . losartan (COZAAR) 100 MG tablet Take 1 tablet (100 mg total) by mouth daily. For Blood pressure 30 tablet 4  . meloxicam (MOBIC) 7.5 MG tablet Take 7.5 mg by mouth daily.    . sertraline (ZOLOFT) 50 MG tablet Take 1 tablet (50 mg total) by mouth daily. 30 tablet 2  . simvastatin (ZOCOR) 10 MG tablet Take 1 tablet (10 mg total) by mouth daily at 6 PM. 30 tablet 6  .  SUMAtriptan (IMITREX) 100 MG tablet TAKE 1TAB AT EARLIEST ONSET OF HEADACHE. MAY REPEAT X1 IN 2 HOURS IF HEADACHE PERSISTS OR RECURS. DO NOT EXCEED 2 TABS IN 24HRS 9 tablet 0  . Blood Glucose Monitoring Suppl (ACCU-CHEK AVIVA PLUS) W/DEVICE KIT Use to check blood sugar 3 times per day. DX CODE: E11.9 1 kit 2  . Blood Glucose Monitoring Suppl (FREESTYLE LITE) DEVI Use to check blood sugar 3 times per day. Dx code: E11.9 1 each 2  . Fluticasone-Salmeterol (ADVAIR) 250-50 MCG/DOSE AEPB Inhale 1 puff into the lungs 2 times daily at 12 noon and 4 pm. (Patient taking differently: Inhale 1 puff into the lungs daily as needed (shortness of breath). ) 60 each 0  . glucose blood (ACCU-CHEK AVIVA PLUS) test strip Use to check  blood sugar 3 times per day.DX Code E11.9 100 each 12  . Lancet Devices (ACCU-CHEK SOFTCLIX) lancets Use to check blood sugar 3 times per day dx code E11.9 100 each 2  . Lancets (ACCU-CHEK MULTICLIX) lancets Use to check blood sugar 3 times per day. DX code E11.9 100 each 12  . [DISCONTINUED] promethazine (PHENERGAN) 25 MG tablet Take 1 tablet (25 mg total) by mouth every 6 (six) hours as needed for nausea. (Patient not taking: Reported on 12/13/2014) 20 tablet 0   No current facility-administered medications on file prior to visit.   Family History  Problem Relation Age of Onset  . Heart attack Mother   . Stroke Mother   . Diabetes Mother   . Hypertension Mother   . Arthritis Mother   . Stroke Father   . Hypertension Sister   . Diabetes Sister   . Seizures Brother   . Diabetes Brother    Social History   Social History  . Marital Status: Married    Spouse Name: N/A  . Number of Children: N/A  . Years of Education: N/A   Occupational History  . Not on file.   Social History Main Topics  . Smoking status: Current Every Day Smoker -- 0.25 packs/day for 22 years    Types: Cigarettes  . Smokeless tobacco: Never Used  . Alcohol Use: 0.0 oz/week    0 Standard drinks or equivalent per week     Comment: socially  . Drug Use: No  . Sexual Activity:    Partners: Male   Other Topics Concern  . Not on file   Social History Narrative    Review of Systems: Other than what is stated in HPI, all other systems are negative.   Objective:   Filed Vitals:   10/29/15 1152  BP: 130/82  Pulse: 100  Temp: 98 F (36.7 C)  Resp: 17    Physical Exam  Constitutional: She is oriented to person, place, and time.  Cardiovascular: Normal rate, regular rhythm and normal heart sounds.   Pulmonary/Chest: Effort normal and breath sounds normal.  Abdominal: Soft. She exhibits no distension and no mass. There is tenderness (periumbilical). There is no rebound and no guarding. No hernia.   Neurological: She is alert and oriented to person, place, and time.  Skin: Skin is warm and dry.  Psychiatric: She has a normal mood and affect.     Lab Results  Component Value Date   WBC 8.5 10/12/2015   HGB 12.8 10/12/2015   HCT 37.3 10/12/2015   MCV 85.6 10/12/2015   PLT 313 10/12/2015   Lab Results  Component Value Date   CREATININE 1.11* 10/12/2015   BUN 7  10/12/2015   NA 141 10/12/2015   K 4.2 10/12/2015   CL 108 10/12/2015   CO2 24 10/12/2015    Lab Results  Component Value Date   HGBA1C 7.2 10/29/2015   Lipid Panel     Component Value Date/Time   CHOL 170 03/01/2014 0511   TRIG 182* 03/01/2014 0511   HDL 47 03/01/2014 0511   CHOLHDL 3.6 03/01/2014 0511   VLDL 36 03/01/2014 0511   LDLCALC 87 03/01/2014 0511       Assessment and plan:   Mida was seen today for follow-up.  Diagnoses and all orders for this visit:  Periumbilical abdominal pain -     Ambulatory referral to Gastroenterology -     POCT urinalysis dipstick  Type 2 diabetes mellitus without complication, with long-term current use of insulin (HCC) -     Glucose (CBG) Patient is currently being managed by Endocrinology. States that her last A1C was 7.2  Return in about 3 months (around 01/26/2016).       Lance Bosch, [facility] [phone] 10/29/2015, 12:18 PM

## 2015-10-29 NOTE — Progress Notes (Signed)
Patient here for follow up from the ED Was seen for lower abd pain Will need referral to GI

## 2015-10-29 NOTE — Patient Instructions (Addendum)
Please continue the same farxiga and trulicity, and: Increase the humalog to 3 times a day (just before each meal), 15-15-20 units.   Please come back for a follow-up appointment in 3-4 months.  check your blood sugar twice a day.  vary the time of day when you check, between before the 3 meals, and at bedtime.  also check if you have symptoms of your blood sugar being too high or too low.  please keep a record of the readings and bring it to your next appointment here (or you can bring the meter itself).  You can write it on any piece of paper.  please call us sooner if your blood sugar goes below 70, or if you have a lot of readings over 200.

## 2015-10-30 ENCOUNTER — Encounter: Payer: Self-pay | Admitting: Gastroenterology

## 2015-11-07 ENCOUNTER — Other Ambulatory Visit: Payer: Self-pay

## 2015-11-07 DIAGNOSIS — E119 Type 2 diabetes mellitus without complications: Secondary | ICD-10-CM | POA: Diagnosis not present

## 2015-11-07 LAB — HM DIABETES EYE EXAM

## 2015-11-07 MED ORDER — INSULIN ASPART 100 UNIT/ML FLEXPEN: PEN_INJECTOR | SUBCUTANEOUS | Status: DC

## 2015-11-19 ENCOUNTER — Other Ambulatory Visit: Payer: Self-pay | Admitting: Internal Medicine

## 2015-12-01 IMAGING — CT CT ANGIO CHEST
2 of 8 series · 17 of 46 positions shown · IV contrast (omnipaque)
Comparison: 11/10/2013 and abdominal CT from 05/24/2007

CLINICAL DATA: Left shoulder surgery this morning. Complains of
central chest pain and shortness of breath.

EXAM:
CT ANGIOGRAPHY CHEST WITH CONTRAST
TECHNIQUE: Multidetector CT imaging of the chest was performed using the
standard protocol during bolus administration of intravenous
contrast. Multiplanar CT image reconstructions and MIPs were
obtained to evaluate the vascular anatomy.
CONTRAST:  100mL OMNIPAQUE IOHEXOL 350 MG/ML SOLN

[Series 6: thins · axial · 0.78mm/px · z∈[+1088,+1329]mm · 14 of 267 slices shown]
[im 13/267  lung]
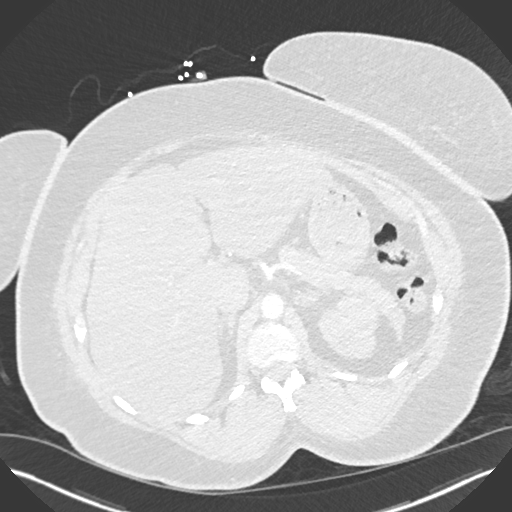
[im 37/267  soft-tissue]
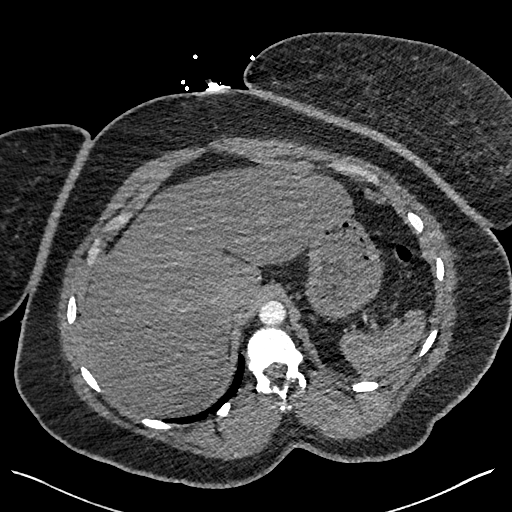
[im 49/267  lung]
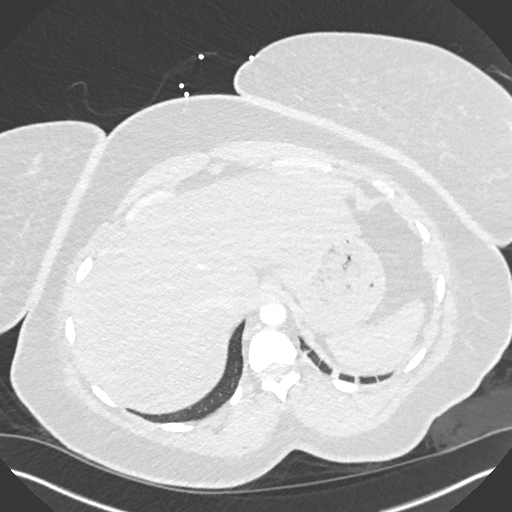
[im 73/267  soft-tissue]
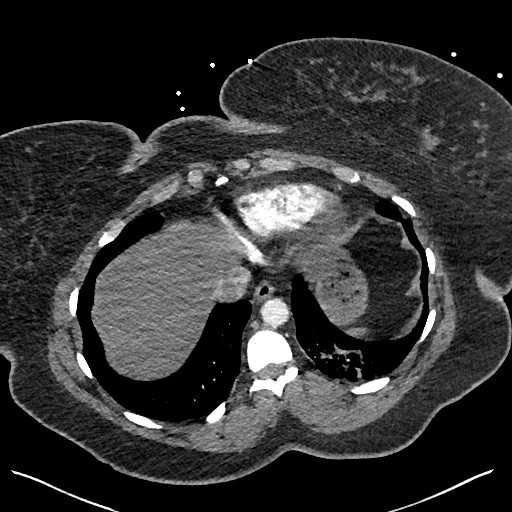
[im 85/267  lung]
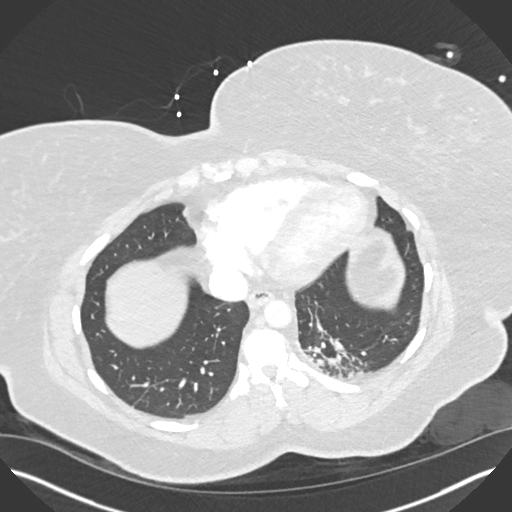
[im 109/267  soft-tissue]
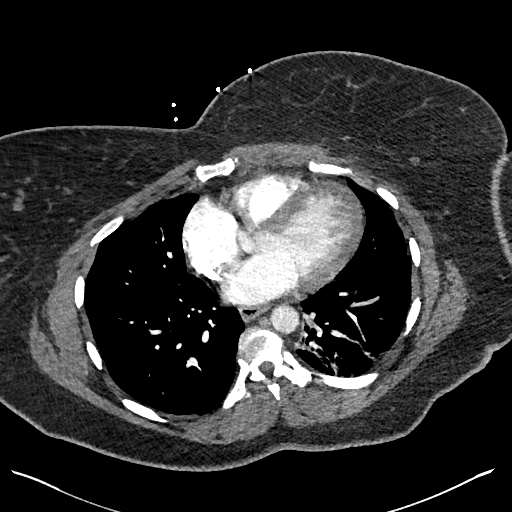
[im 121/267  lung]
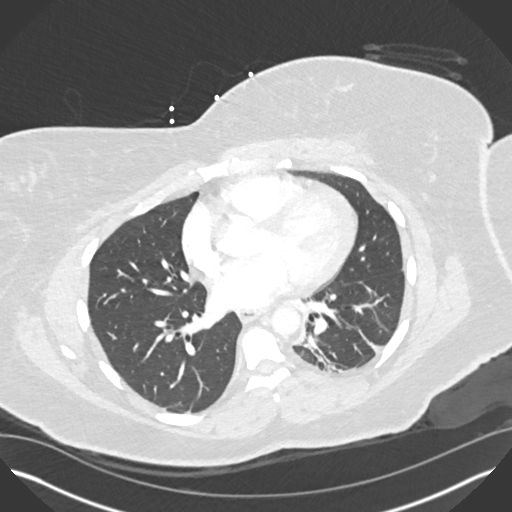
[im 146/267  soft-tissue]
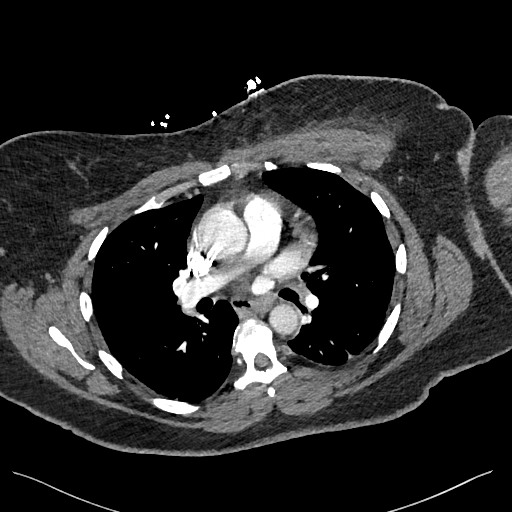
[im 158/267  lung]
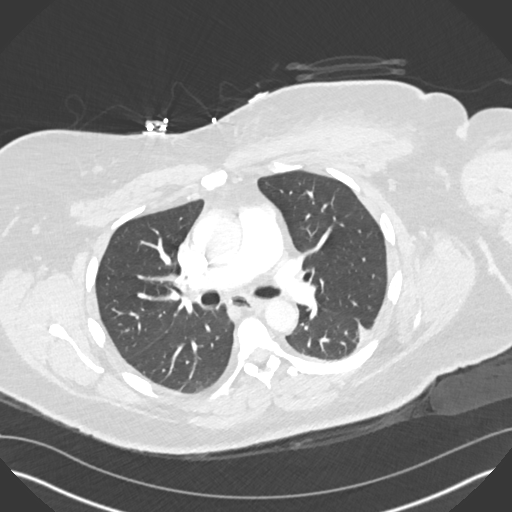
[im 182/267  soft-tissue]
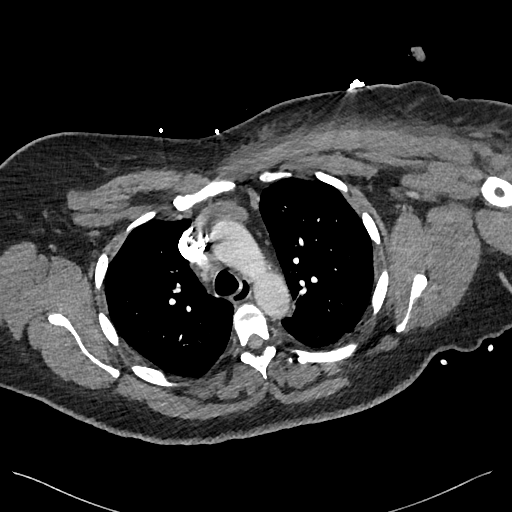
[im 194/267  lung]
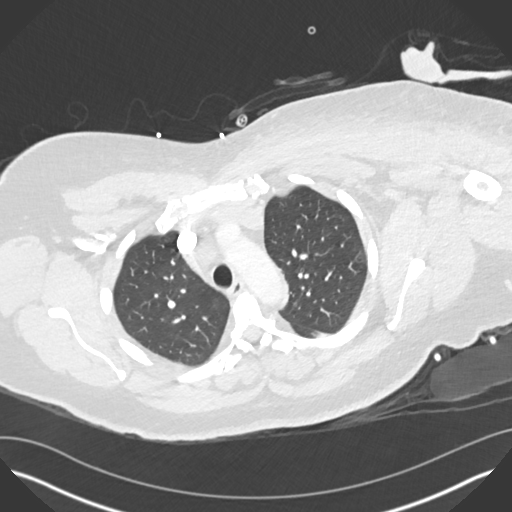
[im 218/267  soft-tissue]
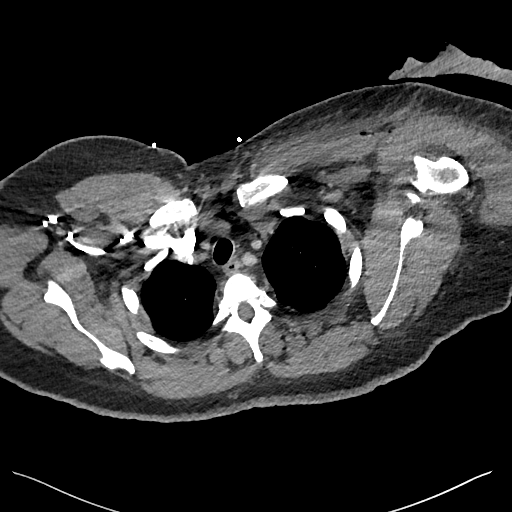
[im 230/267  lung]
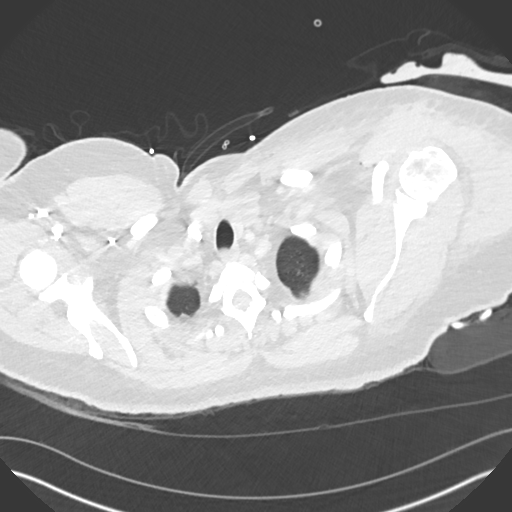
[im 254/267  soft-tissue]
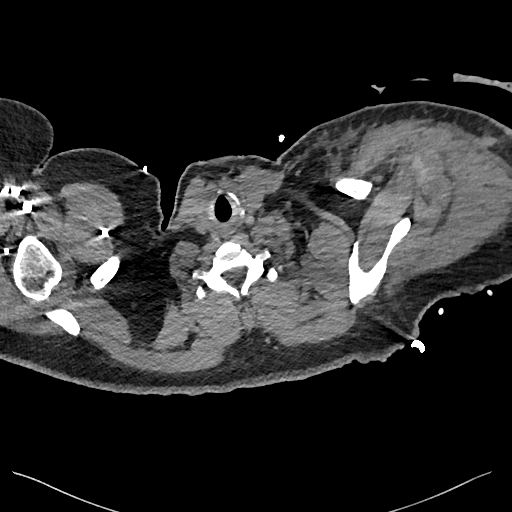

[Series 8: coronal mpr · coronal · 0.53mm/px · 3 of 98 slices shown]
[im 25/98  soft-tissue]
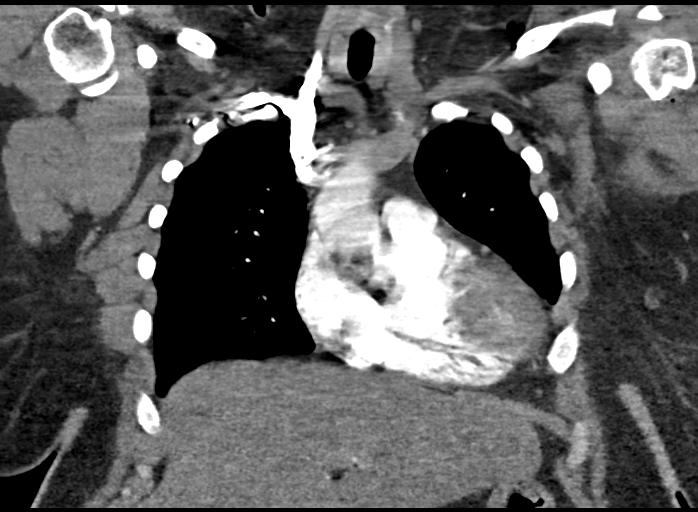
[im 49/98  soft-tissue]
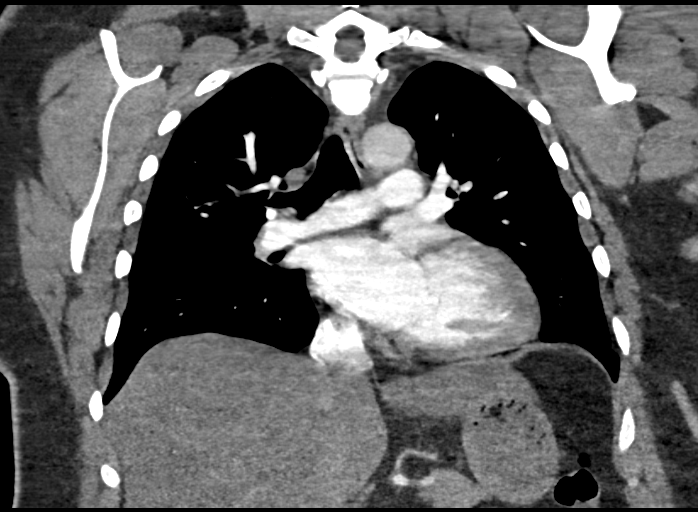
[im 73/98  soft-tissue]
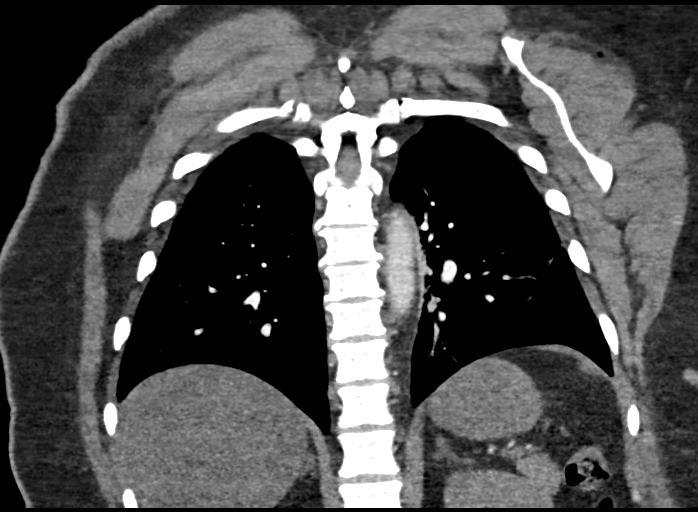

[17 of 46 positions shown; findings below may reference images not displayed]

FINDINGS: Opacification of the pulmonary arteries is not optimal but there is
no evidence for a pulmonary embolism.

There is extensive edema with subcutaneous gas in the left upper
chest and shoulder region compatible with recent surgery. Again
noted is a low-density collection along the right anterior aspect of
the mediastinum. This could represent a benign cystic structure or
fluid within the pericardial recess. This area is difficult to
measure but it may be slightly smaller compared to the previous
examination.

There is abnormal soft tissue along the left side of the esophagus
on sequence 6, image 117. This structure roughly measures 1.4 x
cm. This area is below the carina. In retrospect, there was fullness
in this area on the previous CTA examination but the tissue is now
more conspicuous. This abnormal soft tissue was not present in 8113.

There is no significant pleural fluid. No acute abnormality in the
upper abdomen.

The trachea and mainstem bronchi are patent. There are patchy linear
densities in the medial left lower lobe which likely represents
atelectasis. No large areas of consolidation. Negative for
pneumothorax. Right lung is clear.

Widening at the left AC joint is likely related to recent surgical
changes.

Review of the MIP images confirms the above findings.
IMPRESSION: Negative for pulmonary embolism.

Patchy linear densities in the left lower lobe likely represents
atelectasis. No large areas of consolidation.

There is prominent soft tissue in the left paraesophageal region.
This is clearly new since 8113. This could represent an enlarged and
abnormal lymph node. Consider a GI consultation due to the proximity
of this soft tissue and the esophagus.

Again noted is low-density material in the right paratracheal region
which could represent a benign mediastinum cyst or fluid in the
pericardial recess.

Postsurgical changes in the left upper chest and shoulder.

These results were called by telephone at the time of interpretation
on 05/03/2015 at [DATE] to Dr. JILLYNE FISCHBACH , who verbally
acknowledged these results.

## 2015-12-18 ENCOUNTER — Telehealth: Payer: Self-pay | Admitting: Gastroenterology

## 2015-12-18 ENCOUNTER — Ambulatory Visit: Payer: Medicare Other | Admitting: Gastroenterology

## 2016-01-25 ENCOUNTER — Encounter: Payer: Self-pay | Admitting: Endocrinology

## 2016-01-25 ENCOUNTER — Ambulatory Visit (INDEPENDENT_AMBULATORY_CARE_PROVIDER_SITE_OTHER): Payer: Medicare Other | Admitting: Endocrinology

## 2016-01-25 ENCOUNTER — Other Ambulatory Visit: Payer: Self-pay | Admitting: Internal Medicine

## 2016-01-25 VITALS — BP 148/96 | HR 79 | Temp 97.5°F | Resp 16 | Ht 61.0 in | Wt 238.0 lb

## 2016-01-25 DIAGNOSIS — Z794 Long term (current) use of insulin: Secondary | ICD-10-CM

## 2016-01-25 DIAGNOSIS — E119 Type 2 diabetes mellitus without complications: Secondary | ICD-10-CM | POA: Diagnosis not present

## 2016-01-25 DIAGNOSIS — I10 Essential (primary) hypertension: Secondary | ICD-10-CM

## 2016-01-25 DIAGNOSIS — G629 Polyneuropathy, unspecified: Secondary | ICD-10-CM

## 2016-01-25 LAB — GLUCOSE, POCT (MANUAL RESULT ENTRY): POC GLUCOSE: 78 mg/dL (ref 70–99)

## 2016-01-25 LAB — POCT GLYCOSYLATED HEMOGLOBIN (HGB A1C): HEMOGLOBIN A1C: 7

## 2016-01-25 MED ORDER — DULAGLUTIDE 1.5 MG/0.5ML ~~LOC~~ SOAJ: 1.5000 mg | SUBCUTANEOUS | Status: DC

## 2016-01-25 MED ORDER — DAPAGLIFLOZIN PROPANEDIOL 10 MG PO TABS: 10.0000 mg | ORAL_TABLET | Freq: Every day | ORAL | Status: DC

## 2016-01-25 NOTE — Progress Notes (Signed)
Pre visit review using our clinic review tool, if applicable. No additional management support is needed unless otherwise documented below in the visit note. 

## 2016-01-25 NOTE — Progress Notes (Signed)
Subjective:    Patient ID: [name], female    DOB: 1970-10-26, 45 y.o.   MRN: [id number]  HPI Pt returns for f/u of diabetes mellitus: DM type: Insulin-requiring type 2 Dx'ed: 2800 Complications: painful polyneuropathy Therapy: insulin (since 3491), trulicity, and farxiga GDM: never DKA: never Severe hypoglycemia: never Pancreatitis: never Other: she takes multiple daily injections; she declines weight loss surgery.  Interval history: no cbg record, but states cbg's vary from 94-180.  There is no trend throughout the day.  pt states she feels well in general.  Past Medical History  Diagnosis Date  . Hypertension   . Hypokalemia   . Asthma   . DM (diabetes mellitus) ([place])     INSULIN DEPENDENT  . Depression   . GERD (gastroesophageal reflux disease)   . Headache(784.0)   . Chronic back pain   . Chronic chest pain   . Arthritis     Past Surgical History  Procedure Laterality Date  . Cesarean section      x 3  . Appendectomy    . Vesicovaginal fistula closure w/ tah  2009  . Hernia repair    . Cholecystectomy N/A 03/02/2014    Procedure: LAPAROSCOPIC CHOLECYSTECTOMY;  Surgeon: Joyice Faster. Cornett, MD;  Location: [place];  Service: General;  Laterality: N/A;  . Rotator cuff repair      Social History   Social History  . Marital Status: Married    Spouse Name: N/A  . Number of Children: N/A  . Years of Education: N/A   Occupational History  . Not on file.   Social History Main Topics  . Smoking status: Current Every Day Smoker -- 0.25 packs/day for 22 years    Types: Cigarettes  . Smokeless tobacco: Never Used  . Alcohol Use: 0.0 oz/week    0 Standard drinks or equivalent per week     Comment: socially  . Drug Use: No  . Sexual Activity:    Partners: Male   Other Topics Concern  . Not on file   Social History Narrative    Current Outpatient Prescriptions on File Prior to Visit  Medication Sig Dispense Refill  . albuterol  (PROVENTIL HFA;VENTOLIN HFA) 108 (90 BASE) MCG/ACT inhaler Inhale 2 puffs into the lungs every 6 (six) hours as needed for wheezing or shortness of breath.    Marland Kitchen albuterol (PROVENTIL) (2.5 MG/3ML) 0.083% nebulizer solution Take 2.5 mg by nebulization every 6 (six) hours as needed for wheezing or shortness of breath.    Marland Kitchen amLODipine (NORVASC) 10 MG tablet Take 1 tablet (10 mg total) by mouth daily. 30 tablet 4  . aspirin EC 325 MG tablet Take 325 mg by mouth daily.    . Aspirin-Acetaminophen-Caffeine (GOODY HEADACHE PO) Take 1 Package by mouth daily as needed (for leg pain).    . Blood Glucose Monitoring Suppl (ACCU-CHEK AVIVA PLUS) W/DEVICE KIT Use to check blood sugar 3 times per day. DX CODE: E11.9 1 kit 2  . Blood Glucose Monitoring Suppl (FREESTYLE LITE) DEVI Use to check blood sugar 3 times per day. Dx code: E11.9 1 each 2  . carvedilol (COREG) 3.125 MG tablet TAKE 1 TABLET BY MOUTH 2 TIMES DAILY WITH A MEAL. 60 tablet 5  . docusate sodium (COLACE) 100 MG capsule Take 1 capsule (100 mg total) by mouth every 12 (twelve) hours. 60 capsule 0  . Fluticasone-Salmeterol (ADVAIR) 250-50 MCG/DOSE AEPB Inhale 1 puff into the lungs 2 times daily at 12 noon and 4  pm. (Patient taking differently: Inhale 1 puff into the lungs daily as needed (shortness of breath). ) 60 each 0  . gabapentin (NEURONTIN) 300 MG capsule Take 1 capsule (300 mg total) by mouth 3 (three) times daily. 90 capsule 3  . glucose blood (ACCU-CHEK AVIVA PLUS) test strip Use to check blood sugar 3 times per day.DX Code E11.9 100 each 12  . hydrochlorothiazide (HYDRODIURIL) 25 MG tablet Take 1 tablet (25 mg total) by mouth daily. 30 tablet 4  . ibuprofen (ADVIL,MOTRIN) 800 MG tablet Take 1 tablet (800 mg total) by mouth 3 (three) times daily. 21 tablet 0  . insulin aspart (NOVOLOG FLEXPEN) 100 UNIT/ML FlexPen Inject 3 times a day just before each meal. 15-15-20. (Patient taking differently: Inject 3 times a day just before each meal. 01-24-09  units) 30 mL 2  . Insulin Pen Needle 32G X 4 MM MISC Use to inject insulin 3 times per day. 100 each 2  . ipratropium (ATROVENT) 0.02 % nebulizer solution Take 0.5 mg by nebulization every 6 (six) hours as needed for wheezing or shortness of breath.     Elmore Guise Devices (ACCU-CHEK SOFTCLIX) lancets Use to check blood sugar 3 times per day dx code E11.9 100 each 2  . Lancets (ACCU-CHEK MULTICLIX) lancets Use to check blood sugar 3 times per day. DX code E11.9 100 each 12  . losartan (COZAAR) 100 MG tablet Take 1 tablet (100 mg total) by mouth daily. For Blood pressure 30 tablet 4  . meloxicam (MOBIC) 7.5 MG tablet Take 7.5 mg by mouth daily.    . sertraline (ZOLOFT) 50 MG tablet Take 1 tablet (50 mg total) by mouth daily. 30 tablet 2  . simvastatin (ZOCOR) 10 MG tablet Take 1 tablet (10 mg total) by mouth daily at 6 PM. 30 tablet 6  . SUMAtriptan (IMITREX) 100 MG tablet TAKE 1TAB AT EARLIEST ONSET OF HEADACHE. MAY REPEAT X1 IN 2 HOURS IF HEADACHE PERSISTS OR RECURS. DO NOT EXCEED 2 TABS IN 24HRS 9 tablet 0  . [DISCONTINUED] promethazine (PHENERGAN) 25 MG tablet Take 1 tablet (25 mg total) by mouth every 6 (six) hours as needed for nausea. (Patient not taking: Reported on 12/13/2014) 20 tablet 0   No current facility-administered medications on file prior to visit.    Allergies  Allergen Reactions  . Reglan [Metoclopramide] Other (See Comments)    Panic attack    Family History  Problem Relation Age of Onset  . Heart attack Mother   . Stroke Mother   . Diabetes Mother   . Hypertension Mother   . Arthritis Mother   . Stroke Father   . Hypertension Sister   . Diabetes Sister   . Seizures Brother   . Diabetes Brother     BP 148/96 mmHg  Pulse 79  Temp(Src) 97.5 F (36.4 C) (Oral)  Resp 16  Ht 5' 1"  (1.549 m)  Wt 238 lb (107.956 kg)  BMI 44.99 kg/m2  SpO2 98%  Review of Systems She denies hypoglycemia.    Objective:   Physical Exam VITAL SIGNS:  See vs page GENERAL: no  distress Pulses: dorsalis pedis intact bilat.   MSK: no deformity of the feet CV: 1+ bilat leg edema Skin:  no ulcer on the feet.  normal color and temp on the feet. Neuro: sensation is intact to touch on the feet.    Lab Results  Component Value Date   HGBA1C 7.0 01/25/2016      Assessment & Plan:  DM: she wants to d/c insulin if possible.   Patient is advised the following: Patient Instructions  i have sent prescriptions to your pharmacy, to double the farxiga and trulicity, and:  decrease the humalog to 3 times a day (just before each meal), 01-24-09 units.   Please come back for a follow-up appointment in 4 months.  blood and urine tests are requested for you today.  We'll let you know about the results. check your blood sugar twice a day.  vary the time of day when you check, between before the 3 meals, and at bedtime.  also check if you have symptoms of your blood sugar being too high or too low.  please keep a record of the readings and bring it to your next appointment here (or you can bring the meter itself).  You can write it on any piece of paper.  please call us sooner if your blood sugar goes below 70, or if you have a lot of readings over 200.

## 2016-01-25 NOTE — Patient Instructions (Addendum)
i have sent prescriptions to your pharmacy, to double the farxiga and trulicity, and:  decrease the humalog to 3 times a day (just before each meal), 01-24-09 units.   Please come back for a follow-up appointment in 4 months.  blood and urine tests are requested for you today.  We'll let you know about the results. check your blood sugar twice a day.  vary the time of day when you check, between before the 3 meals, and at bedtime.  also check if you have symptoms of your blood sugar being too high or too low.  please keep a record of the readings and bring it to your next appointment here (or you can bring the meter itself).  You can write it on any piece of paper.  please call us sooner if your blood sugar goes below 70, or if you have a lot of readings over 200.

## 2016-01-25 NOTE — Telephone Encounter (Signed)
Pt. Called requesting a refill on all her current medication. I told pt. That most of the medication was not prescribed by her PCP. Please f/u with pt.

## 2016-01-28 MED ORDER — HYDROCHLOROTHIAZIDE 25 MG PO TABS: 25.0000 mg | ORAL_TABLET | Freq: Every day | ORAL | Status: DC

## 2016-01-28 MED ORDER — SERTRALINE HCL 50 MG PO TABS: 50.0000 mg | ORAL_TABLET | Freq: Every day | ORAL | Status: DC

## 2016-01-28 MED ORDER — GABAPENTIN 300 MG PO CAPS: 300.0000 mg | ORAL_CAPSULE | Freq: Three times a day (TID) | ORAL | Status: DC

## 2016-01-28 MED ORDER — LOSARTAN POTASSIUM 100 MG PO TABS: 100.0000 mg | ORAL_TABLET | Freq: Every day | ORAL | Status: DC

## 2016-01-28 MED ORDER — SIMVASTATIN 10 MG PO TABS: 10.0000 mg | ORAL_TABLET | Freq: Every day | ORAL | Status: DC

## 2016-01-28 MED ORDER — AMLODIPINE BESYLATE 10 MG PO TABS: 10.0000 mg | ORAL_TABLET | Freq: Every day | ORAL | Status: DC

## 2016-01-28 MED ORDER — CARVEDILOL 3.125 MG PO TABS: ORAL_TABLET | ORAL | Status: DC

## 2016-01-28 NOTE — Telephone Encounter (Signed)
Pt. Called requesting a refill on all her current medication. I told pt. That most of the medication was not prescribed by her PCP. Please f/u with pt.

## 2016-01-28 NOTE — Telephone Encounter (Signed)
I refilled the medications that were ordered by Chari Manning, NP. Patient needs an appointment to be established with a new primary care provider for future refills.

## 2016-01-29 ENCOUNTER — Other Ambulatory Visit: Payer: Self-pay | Admitting: Neurology

## 2016-01-29 NOTE — Telephone Encounter (Signed)
Not seen since December '15. RX was denied.

## 2016-01-30 ENCOUNTER — Other Ambulatory Visit (INDEPENDENT_AMBULATORY_CARE_PROVIDER_SITE_OTHER): Payer: Medicare Other

## 2016-01-30 DIAGNOSIS — E119 Type 2 diabetes mellitus without complications: Secondary | ICD-10-CM

## 2016-01-30 DIAGNOSIS — Z794 Long term (current) use of insulin: Secondary | ICD-10-CM

## 2016-01-30 LAB — BASIC METABOLIC PANEL
BUN: 16 mg/dL (ref 6–23)
CALCIUM: 9 mg/dL (ref 8.4–10.5)
CO2: 24 meq/L (ref 19–32)
Chloride: 105 mEq/L (ref 96–112)
Creatinine, Ser: 1.02 mg/dL (ref 0.40–1.20)
GFR: 74.91 mL/min (ref >60.00)
GLUCOSE: 137 mg/dL — AB (ref 70–99)
POTASSIUM: 3.7 meq/L (ref 3.5–5.1)
SODIUM: 139 meq/L (ref 135–145)

## 2016-01-30 LAB — MICROALBUMIN / CREATININE URINE RATIO
Creatinine,U: 352.5 mg/dL
MICROALB/CREAT RATIO: 1 mg/g (ref 0.0–30.0)
Microalb, Ur: 3.5 mg/dL — ABNORMAL HIGH (ref 0.0–1.9)

## 2016-02-14 ENCOUNTER — Ambulatory Visit: Payer: Medicare Other | Admitting: Gastroenterology

## 2016-02-14 ENCOUNTER — Other Ambulatory Visit: Payer: Self-pay

## 2016-02-18 ENCOUNTER — Ambulatory Visit: Payer: Medicare Other | Admitting: Family Medicine

## 2016-02-22 ENCOUNTER — Encounter: Payer: Self-pay | Admitting: Family Medicine

## 2016-02-22 ENCOUNTER — Ambulatory Visit: Payer: Medicare Other | Attending: Family Medicine | Admitting: Family Medicine

## 2016-02-22 VITALS — BP 141/97 | HR 96 | Temp 98.2°F | Ht 61.0 in | Wt 231.0 lb

## 2016-02-22 DIAGNOSIS — E119 Type 2 diabetes mellitus without complications: Secondary | ICD-10-CM

## 2016-02-22 DIAGNOSIS — Z794 Long term (current) use of insulin: Secondary | ICD-10-CM

## 2016-02-22 DIAGNOSIS — J4530 Mild persistent asthma, uncomplicated: Secondary | ICD-10-CM | POA: Diagnosis not present

## 2016-02-22 DIAGNOSIS — K219 Gastro-esophageal reflux disease without esophagitis: Secondary | ICD-10-CM | POA: Insufficient documentation

## 2016-02-22 DIAGNOSIS — F329 Major depressive disorder, single episode, unspecified: Secondary | ICD-10-CM

## 2016-02-22 DIAGNOSIS — I10 Essential (primary) hypertension: Secondary | ICD-10-CM

## 2016-02-22 DIAGNOSIS — K2990 Gastroduodenitis, unspecified, without bleeding: Secondary | ICD-10-CM

## 2016-02-22 DIAGNOSIS — G43119 Migraine with aura, intractable, without status migrainosus: Secondary | ICD-10-CM

## 2016-02-22 DIAGNOSIS — F32A Depression, unspecified: Secondary | ICD-10-CM | POA: Insufficient documentation

## 2016-02-22 DIAGNOSIS — G629 Polyneuropathy, unspecified: Secondary | ICD-10-CM | POA: Diagnosis not present

## 2016-02-22 DIAGNOSIS — K2970 Gastritis, unspecified, without bleeding: Secondary | ICD-10-CM

## 2016-02-22 DIAGNOSIS — E785 Hyperlipidemia, unspecified: Secondary | ICD-10-CM | POA: Insufficient documentation

## 2016-02-22 LAB — LIPID PANEL
Cholesterol: 166 mg/dL (ref 125–200)
HDL: 43 mg/dL — ABNORMAL LOW (ref >=46)
LDL CALC: 97 mg/dL (ref <130)
Total CHOL/HDL Ratio: 3.9 Ratio (ref <=5.0)
Triglycerides: 131 mg/dL (ref <150)
VLDL: 26 mg/dL (ref <30)

## 2016-02-22 MED ORDER — GABAPENTIN 300 MG PO CAPS: 300.0000 mg | ORAL_CAPSULE | Freq: Three times a day (TID) | ORAL | Status: DC

## 2016-02-22 MED ORDER — ALBUTEROL SULFATE HFA 108 (90 BASE) MCG/ACT IN AERS: 2.0000 | INHALATION_SPRAY | Freq: Four times a day (QID) | RESPIRATORY_TRACT | Status: DC | PRN

## 2016-02-22 MED ORDER — LOSARTAN POTASSIUM 100 MG PO TABS: 100.0000 mg | ORAL_TABLET | Freq: Every day | ORAL | Status: DC

## 2016-02-22 MED ORDER — PANTOPRAZOLE SODIUM 40 MG PO TBEC: 40.0000 mg | DELAYED_RELEASE_TABLET | Freq: Every day | ORAL | Status: DC

## 2016-02-22 MED ORDER — SIMVASTATIN 10 MG PO TABS: 10.0000 mg | ORAL_TABLET | Freq: Every day | ORAL | Status: DC

## 2016-02-22 MED ORDER — SERTRALINE HCL 50 MG PO TABS: 50.0000 mg | ORAL_TABLET | Freq: Every day | ORAL | Status: DC

## 2016-02-22 MED ORDER — ALBUTEROL SULFATE (2.5 MG/3ML) 0.083% IN NEBU
2.5000 mg | INHALATION_SOLUTION | Freq: Four times a day (QID) | RESPIRATORY_TRACT | Status: DC | PRN
Start: 2016-02-22 — End: 2016-07-02

## 2016-02-22 MED ORDER — FLUTICASONE-SALMETEROL 250-50 MCG/DOSE IN AEPB: 1.0000 | INHALATION_SPRAY | Freq: Two times a day (BID) | RESPIRATORY_TRACT | Status: DC

## 2016-02-22 MED ORDER — SUMATRIPTAN SUCCINATE 100 MG PO TABS
ORAL_TABLET | ORAL | Status: DC
Start: 2016-02-22 — End: 2016-07-02

## 2016-02-22 MED ORDER — CARVEDILOL 6.25 MG PO TABS: ORAL_TABLET | ORAL | Status: DC

## 2016-02-22 NOTE — Progress Notes (Signed)
Subjective:  Patient ID: [name], female    DOB: December 28, 1970  Age: 45 y.o. MRN: [id number]  CC: No chief complaint on file.   [name] is a 45 year old female with a history of type 2 diabetes mellitus (A1c 7 from 01/2016), hypertension, asthma, depression who comes into the clinic to establish care with me as she was previously followed by the nurse practitioner who is no longer with the practice.  She has been compliant with her diabetic regimen and is closely followed by [facility] and denies any hypoglycemia; her annual eye exam comes up in a few months with an ophthalmologist. Asthma has also been controlled with no recent exacerbation, shortness of breath or chest pain.  Remains on Imitrex for breakthrough migraines and denies any headaches at this time; taking Zoloft for depression.  She has occasional epigastric pain but denies nausea, vomiting or constipation. She will need a refill of all her medications.  Outpatient Prescriptions Prior to Visit  Medication Sig Dispense Refill  . aspirin EC 325 MG tablet Take 325 mg by mouth daily.    . Aspirin-Acetaminophen-Caffeine (GOODY HEADACHE PO) Take 1 Package by mouth daily as needed (for leg pain).    . Blood Glucose Monitoring Suppl (ACCU-CHEK AVIVA PLUS) W/DEVICE KIT Use to check blood sugar 3 times per day. DX CODE: E11.9 1 kit 2  . Blood Glucose Monitoring Suppl (FREESTYLE LITE) DEVI Use to check blood sugar 3 times per day. Dx code: E11.9 1 each 2  . carvedilol (COREG) 3.125 MG tablet TAKE 1 TABLET BY MOUTH 2 TIMES DAILY WITH A MEAL. Must have Office visit for refills 60 tablet 0  . dapagliflozin propanediol (FARXIGA) 10 MG TABS tablet Take 10 mg by mouth daily. 30 tablet 11  . Dulaglutide (TRULICITY) 1.5 WU/9.8JX SOPN Inject 1.5 mg into the skin once a week. 4 pen 11  . glucose blood (ACCU-CHEK AVIVA PLUS) test strip Use to check blood sugar 3 times per day.DX Code E11.9 100 each  12  . hydrochlorothiazide (HYDRODIURIL) 25 MG tablet Take 1 tablet (25 mg total) by mouth daily. 30 tablet 4  . insulin aspart (NOVOLOG FLEXPEN) 100 UNIT/ML FlexPen Inject 3 times a day just before each meal. 15-15-20. (Patient taking differently: Inject 3 times a day just before each meal. 01-24-09 units) 30 mL 2  . Insulin Pen Needle 32G X 4 MM MISC Use to inject insulin 3 times per day. 100 each 2  . Lancet Devices (ACCU-CHEK SOFTCLIX) lancets Use to check blood sugar 3 times per day dx code E11.9 100 each 2  . Lancets (ACCU-CHEK MULTICLIX) lancets Use to check blood sugar 3 times per day. DX code E11.9 100 each 12  . meloxicam (MOBIC) 7.5 MG tablet Take 7.5 mg by mouth daily.    Marland Kitchen amLODipine (NORVASC) 10 MG tablet Take 1 tablet (10 mg total) by mouth daily. Must have OV for refills 30 tablet 0  . docusate sodium (COLACE) 100 MG capsule Take 1 capsule (100 mg total) by mouth every 12 (twelve) hours. 60 capsule 0  . gabapentin (NEURONTIN) 300 MG capsule Take 1 capsule (300 mg total) by mouth 3 (three) times daily. 90 capsule 0  . ipratropium (ATROVENT) 0.02 % nebulizer solution Take 0.5 mg by nebulization every 6 (six) hours as needed for wheezing or shortness of breath.     . losartan (COZAAR) 100 MG tablet Take 1 tablet (100 mg total) by mouth daily. For Blood pressure -  must have office visit for refills 30 tablet 0  . sertraline (ZOLOFT) 50 MG tablet Take 1 tablet (50 mg total) by mouth daily. 30 tablet 0  . simvastatin (ZOCOR) 10 MG tablet Take 1 tablet (10 mg total) by mouth daily at 6 PM. 30 tablet 0  . ibuprofen (ADVIL,MOTRIN) 800 MG tablet Take 1 tablet (800 mg total) by mouth 3 (three) times daily. (Patient not taking: Reported on 02/22/2016) 21 tablet 0  . albuterol (PROVENTIL HFA;VENTOLIN HFA) 108 (90 BASE) MCG/ACT inhaler Inhale 2 puffs into the lungs every 6 (six) hours as needed for wheezing or shortness of breath. Reported on 02/22/2016    . albuterol (PROVENTIL) (2.5 MG/3ML) 0.083%  nebulizer solution Take 2.5 mg by nebulization every 6 (six) hours as needed for wheezing or shortness of breath. Reported on 02/22/2016    . Fluticasone-Salmeterol (ADVAIR) 250-50 MCG/DOSE AEPB Inhale 1 puff into the lungs 2 times daily at 12 noon and 4 pm. (Patient not taking: Reported on 02/22/2016) 60 each 0  . SUMAtriptan (IMITREX) 100 MG tablet TAKE 1TAB AT EARLIEST ONSET OF HEADACHE. MAY REPEAT X1 IN 2 HOURS IF HEADACHE PERSISTS OR RECURS. DO NOT EXCEED 2 TABS IN 24HRS (Patient not taking: Reported on 02/22/2016) 9 tablet 0   No facility-administered medications prior to visit.    ROS Review of Systems  Constitutional: Negative for activity change, appetite change and fatigue.  HENT: Negative for congestion, sinus pressure and sore throat.   Eyes: Negative for visual disturbance.  Respiratory: Negative for cough, chest tightness, shortness of breath and wheezing.   Cardiovascular: Negative for chest pain and palpitations.  Gastrointestinal: Positive for abdominal pain. Negative for constipation and abdominal distention.  Endocrine: Negative for polydipsia.  Genitourinary: Negative for dysuria and frequency.  Musculoskeletal: Negative for back pain and arthralgias.  Skin: Negative for rash.  Neurological: Negative for tremors, light-headedness and numbness.  Hematological: Does not bruise/bleed easily.  Psychiatric/Behavioral: Negative for behavioral problems and agitation.    Objective:  BP 141/97 mmHg  Pulse 96  Temp(Src) 98.2 F (36.8 C)  Ht 5' 1"  (1.549 m)  Wt 231 lb (104.781 kg)  BMI 43.67 kg/m2  BP/Weight 02/22/2016 5/0/5397 02/26/3418  Systolic BP 379 024 097  Diastolic BP 97 96 82  Wt. (Lbs) 231 238 230  BMI 43.67 44.99 43.48      Physical Exam  Constitutional: She is oriented to person, place, and time. She appears well-developed and well-nourished.  Cardiovascular: Normal rate, normal heart sounds and intact distal pulses.   No murmur heard. Pulmonary/Chest: Effort  normal and breath sounds normal. She has no wheezes. She has no rales. She exhibits no tenderness.  Abdominal: Soft. Bowel sounds are normal. She exhibits no distension and no mass. There is tenderness ( mild epigastric pain).  Musculoskeletal: Normal range of motion.  Neurological: She is alert and oriented to person, place, and time.  Skin: Skin is warm.  Psychiatric: She has a normal mood and affect.     Lab Results  Component Value Date   HGBA1C 7.0 01/25/2016   CMP Latest Ref Rng 01/30/2016 10/12/2015 09/12/2015  Glucose 70 - 99 mg/dL 137(H) 114(H) 184(H)  BUN 6 - 23 mg/dL 16 7 8   Creatinine 0.40 - 1.20 mg/dL 1.02 1.11(H) 0.85  Sodium 135 - 145 mEq/L 139 141 140  Potassium 3.5 - 5.1 mEq/L 3.7 4.2 3.9  Chloride 96 - 112 mEq/L 105 108 106  CO2 19 - 32 mEq/L 24 24 25   Calcium 8.4 -  10.5 mg/dL 9.0 9.6 9.1  Total Protein 6.5 - 8.1 g/dL - 7.2 -  Total Bilirubin 0.3 - 1.2 mg/dL - 0.4 -  Alkaline Phos 38 - 126 U/L - 78 -  AST 15 - 41 U/L - 17 -  ALT 14 - 54 U/L - 17 -     Assessment & Plan:   1. Type 2 diabetes mellitus without complication, with long-term current use of insulin (HCC) Controlled with A1c of 7 Management as per endocrine - simvastatin (ZOCOR) 10 MG tablet; Take 1 tablet (10 mg total) by mouth daily at 6 PM.  Dispense: 30 tablet; Refill: 3 - Lipid panel  2. Essential hypertension, benign Controlled Discontinued amlodipine due to polypharmacy Increased dose of carvedilol to 6.25 mg - losartan (COZAAR) 100 MG tablet; Take 1 tablet (100 mg total) by mouth daily. For Blood pressure - must have office visit for refills  Dispense: 30 tablet; Refill: 0  3. Neuropathy (HCC) Controlled - gabapentin (NEURONTIN) 300 MG capsule; Take 1 capsule (300 mg total) by mouth 3 (three) times daily.  Dispense: 90 capsule; Refill: 0  4. Stable asthma, mild persistent Stable - Fluticasone-Salmeterol (ADVAIR) 250-50 MCG/DOSE AEPB; Inhale 1 puff into the lungs 2 times daily at 12  noon and 4 pm.  Dispense: 60 each; Refill: 3 - albuterol (PROVENTIL HFA;VENTOLIN HFA) 108 (90 Base) MCG/ACT inhaler; Inhale 2 puffs into the lungs every 6 (six) hours as needed for wheezing or shortness of breath. Reported on 02/22/2016  Dispense: 1 each; Refill: 3 - albuterol (PROVENTIL) (2.5 MG/3ML) 0.083% nebulizer solution; Take 3 mLs (2.5 mg total) by nebulization every 6 (six) hours as needed for wheezing or shortness of breath. Reported on 02/22/2016  Dispense: 75 mL; Refill: 3  5. Intractable migraine with aura without status migrainosus Controlled - SUMAtriptan (IMITREX) 100 MG tablet; TAKE 1TAB AT EARLIEST ONSET OF HEADACHE. MAY REPEAT X1 IN 2 HOURS IF HEADACHE PERSISTS OR RECURS. DO NOT EXCEED 2 TABS IN 24HRS  Dispense: 9 tablet; Refill: 2  6. Gastritis and gastroduodenitis Epigastric pain could be symptoms of gastritis Placed on PPI - pantoprazole (PROTONIX) 40 MG tablet; Take 1 tablet (40 mg total) by mouth daily.  Dispense: 30 tablet; Refill: 3  7. Hyperlipidemia Fasting lipid panel ordered  8. Depression controlled - sertraline (ZOLOFT) 50 MG tablet; Take 1 tablet (50 mg total) by mouth daily.  Dispense: 30 tablet; Refill: 3   Meds ordered this encounter  Medications  . Fluticasone-Salmeterol (ADVAIR) 250-50 MCG/DOSE AEPB    Sig: Inhale 1 puff into the lungs 2 times daily at 12 noon and 4 pm.    Dispense:  60 each    Refill:  3  . simvastatin (ZOCOR) 10 MG tablet    Sig: Take 1 tablet (10 mg total) by mouth daily at 6 PM.    Dispense:  30 tablet    Refill:  3  . losartan (COZAAR) 100 MG tablet    Sig: Take 1 tablet (100 mg total) by mouth daily. For Blood pressure - must have office visit for refills    Dispense:  30 tablet    Refill:  0  . albuterol (PROVENTIL HFA;VENTOLIN HFA) 108 (90 Base) MCG/ACT inhaler    Sig: Inhale 2 puffs into the lungs every 6 (six) hours as needed for wheezing or shortness of breath. Reported on 02/22/2016    Dispense:  1 each    Refill:  3    . albuterol (PROVENTIL) (2.5 MG/3ML) 0.083% nebulizer solution  Sig: Take 3 mLs (2.5 mg total) by nebulization every 6 (six) hours as needed for wheezing or shortness of breath. Reported on 02/22/2016    Dispense:  75 mL    Refill:  3  . gabapentin (NEURONTIN) 300 MG capsule    Sig: Take 1 capsule (300 mg total) by mouth 3 (three) times daily.    Dispense:  90 capsule    Refill:  0  . sertraline (ZOLOFT) 50 MG tablet    Sig: Take 1 tablet (50 mg total) by mouth daily.    Dispense:  30 tablet    Refill:  3  . SUMAtriptan (IMITREX) 100 MG tablet    Sig: TAKE 1TAB AT EARLIEST ONSET OF HEADACHE. MAY REPEAT X1 IN 2 HOURS IF HEADACHE PERSISTS OR RECURS. DO NOT EXCEED 2 TABS IN 24HRS    Dispense:  9 tablet    Refill:  2  . pantoprazole (PROTONIX) 40 MG tablet    Sig: Take 1 tablet (40 mg total) by mouth daily.    Dispense:  30 tablet    Refill:  3    Follow-up: Return in about 3 months (around 05/24/2016) for Follow-up on diabetes mellitus.   Arnoldo Morale MD

## 2016-03-18 MED FILL — LOSARTAN POTASSIUM 100 MG T: 100 | 30 days supply | Qty: 30 | Fill #0

## 2016-03-18 MED FILL — CARVEDILOL 6.25 MG TABLET: 6.25 | 30 days supply | Qty: 60 | Fill #0

## 2016-03-18 MED FILL — SUMATRIPTAN SUCC 100 MG TAB: 100 | 30 days supply | Qty: 9 | Fill #0

## 2016-03-18 MED FILL — VENTOLIN HFA 90 MCG INHALER: 108 (90 BAS | 28 days supply | Qty: 18 | Fill #0

## 2016-03-18 MED FILL — ADVAIR 250/50 DISKUS: 250-50 | 30 days supply | Qty: 60 | Fill #0

## 2016-03-18 MED FILL — PANTOPRAZOLE SOD DR 40 MG T: 40 | 30 days supply | Qty: 30 | Fill #0

## 2016-03-18 MED FILL — SIMVASTATIN 10 MG TABLET: 10 | 30 days supply | Qty: 30 | Fill #0

## 2016-03-18 MED FILL — GABAPENTIN 300 MG CAPSULE: 300 | 30 days supply | Qty: 90 | Fill #0

## 2016-03-21 IMAGING — DX DG CHEST 2V
2 series · 2 of 2 positions shown · non-contrast
Comparison: 06/04/2014

CLINICAL DATA: Chest and back pain. Cough. Wheezing. Shortness
breath. Chills.

EXAM:
CHEST  2 VIEW

[w chest pa]
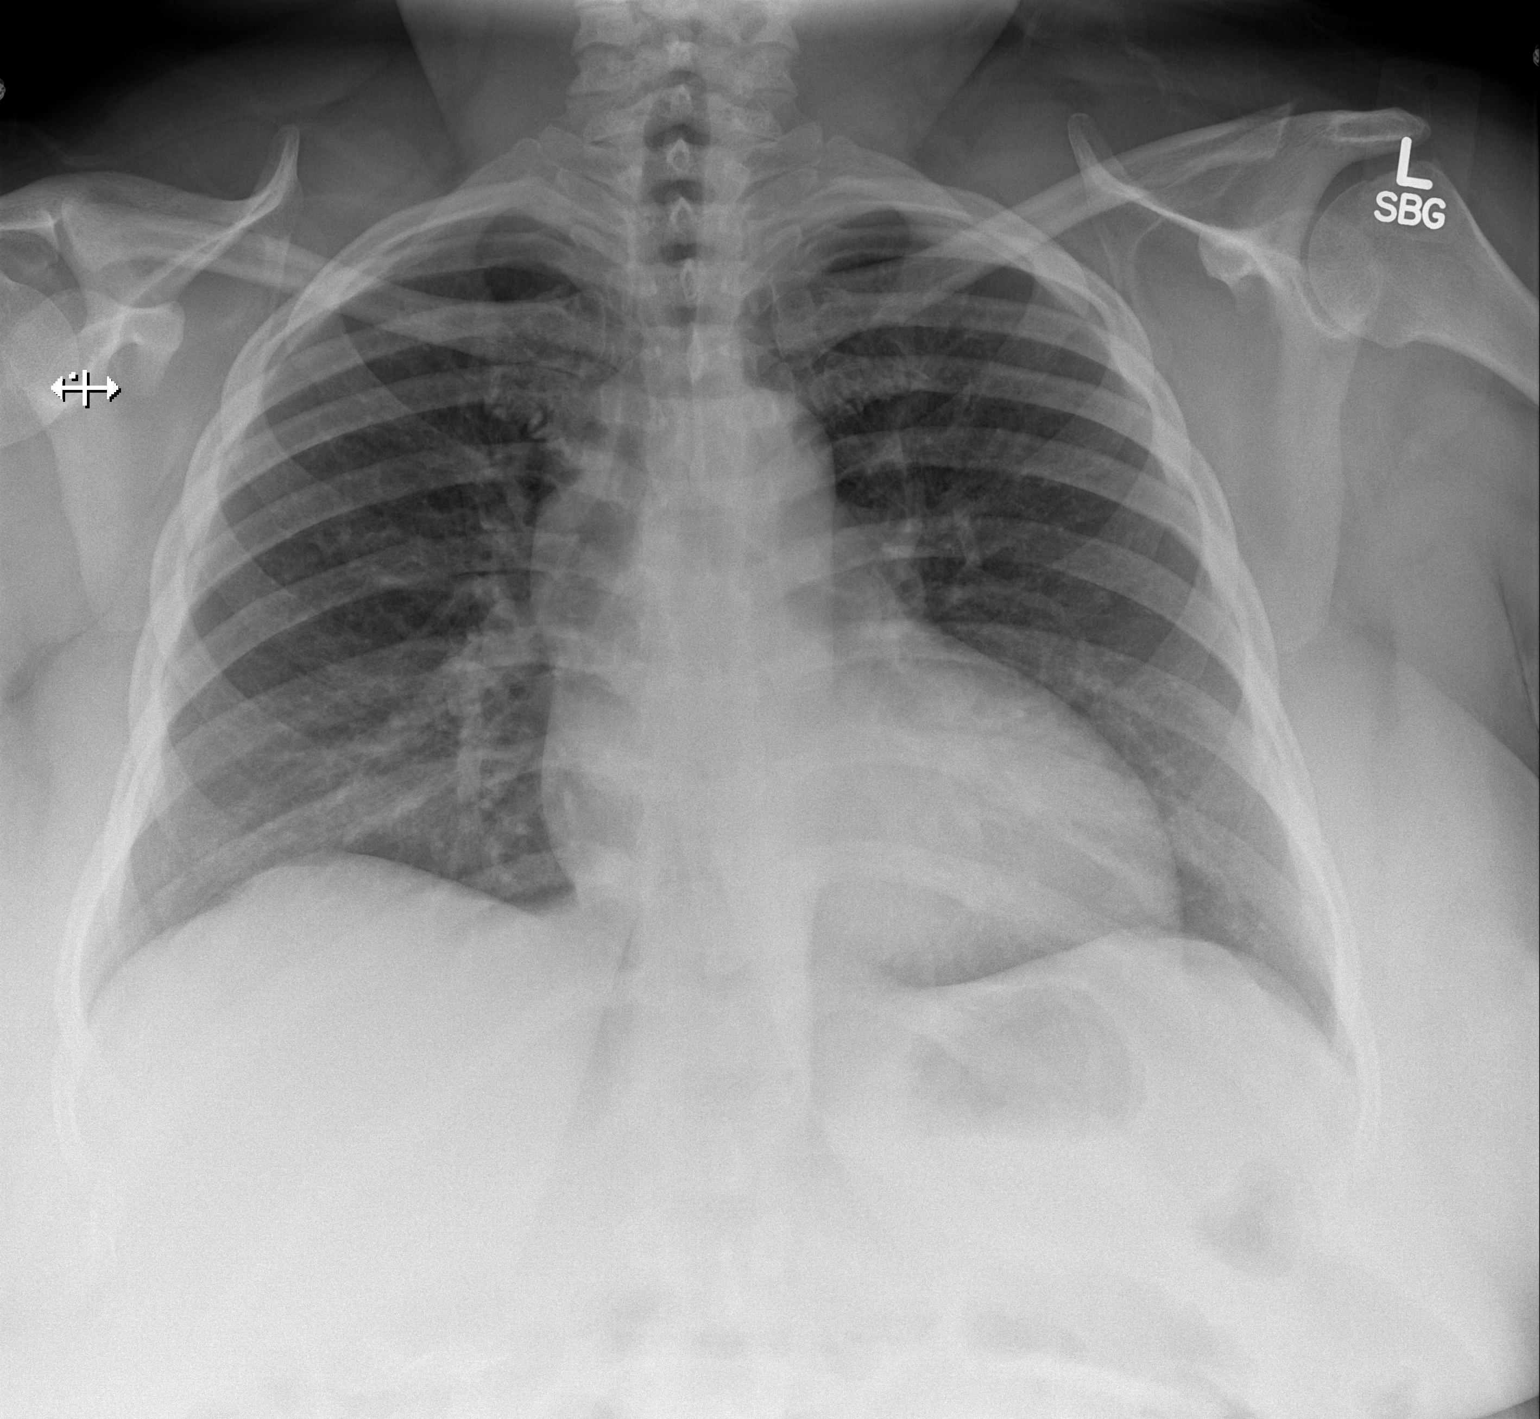

[w chest lat]
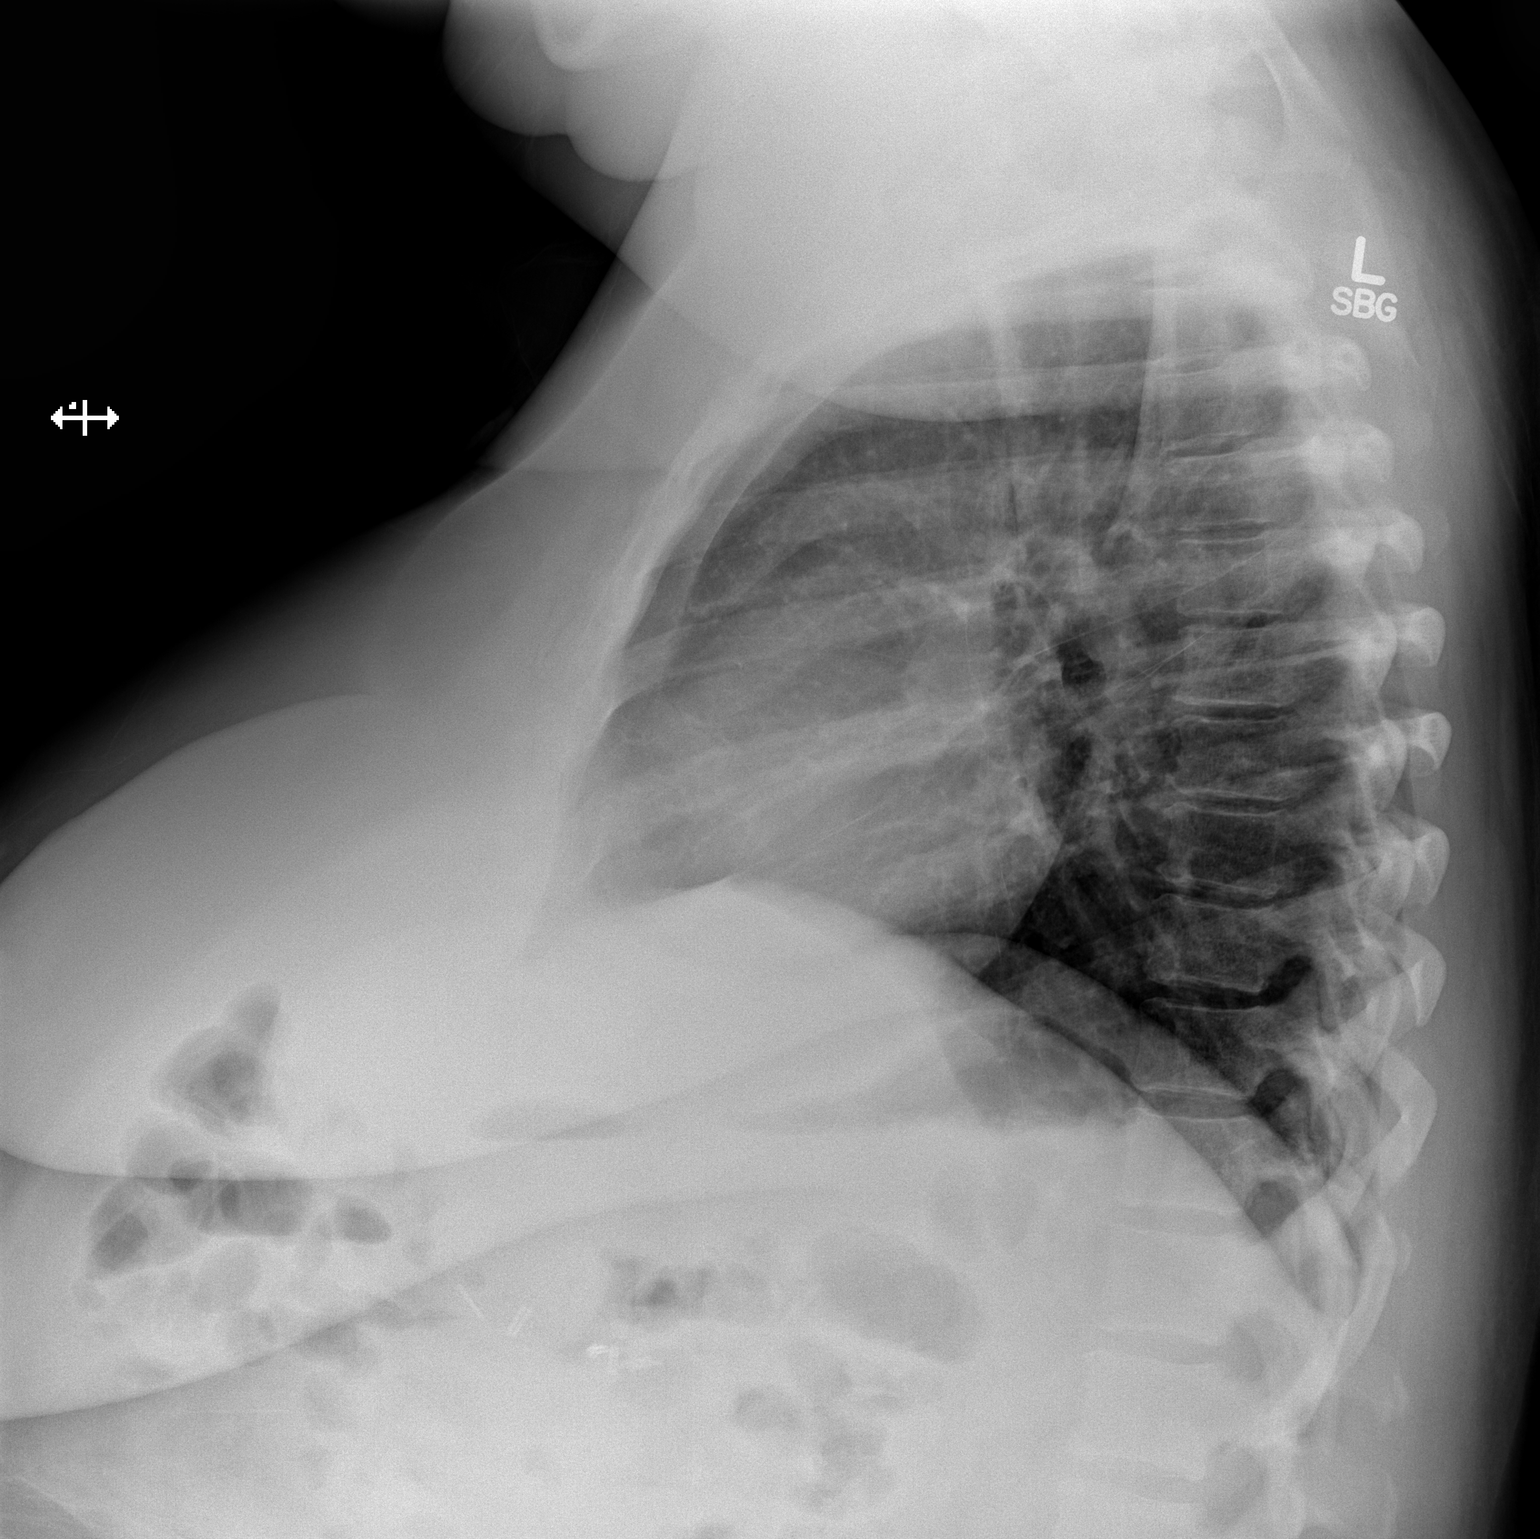

[2 of 2 positions shown; findings below may reference images not displayed]

FINDINGS: The heart size and mediastinal contours are within normal limits.
Both lungs are clear. The visualized skeletal structures are
unremarkable.
IMPRESSION: No active cardiopulmonary disease.

## 2016-03-21 MED FILL — SERTRALINE HCL 50 MG TABLET: 50 | 30 days supply | Qty: 30 | Fill #0

## 2016-05-11 IMAGING — CT CT ABD-PELV W/ CM
2 of 5 series · 11 of 46 positions shown, 12 images · IV contrast (Iodine)
Comparison: 11/10/2013

CLINICAL DATA: Low abdominal pain starting [REDACTED] with nausea
and diarrhea.

EXAM:
CT ABDOMEN AND PELVIS WITH CONTRAST
TECHNIQUE: Multidetector CT imaging of the abdomen and pelvis was performed
using the standard protocol following bolus administration of
intravenous contrast.
CONTRAST:  100mL OMNIPAQUE IOHEXOL 300 MG/ML  SOLN

[Series 201: routine, idose (2) · axial · 0.81mm/px · z∈[+469,+829]mm · 8 of 89 slices shown, 9 images]
[im 11/89  soft-tissue]
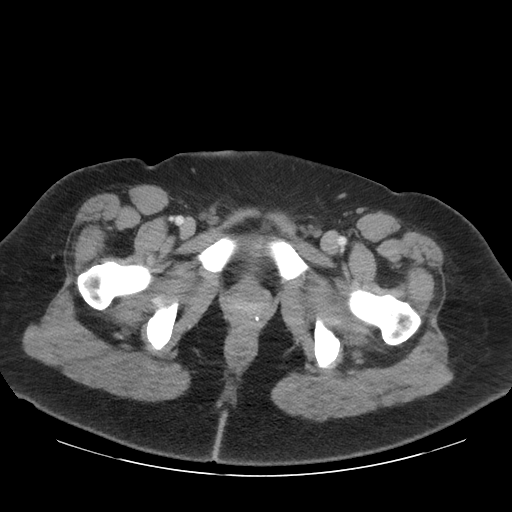
[im 11/89  bone]
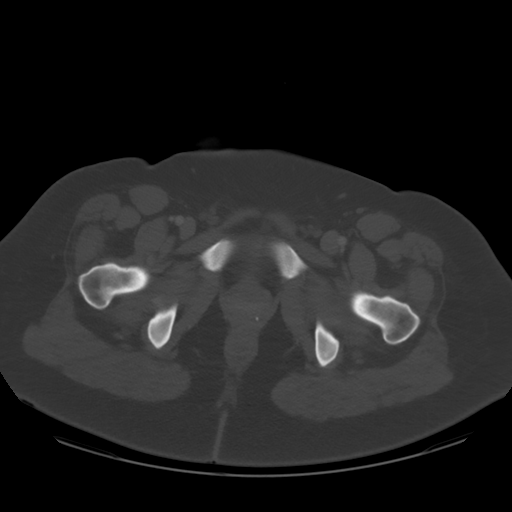
[im 21/89  soft-tissue]
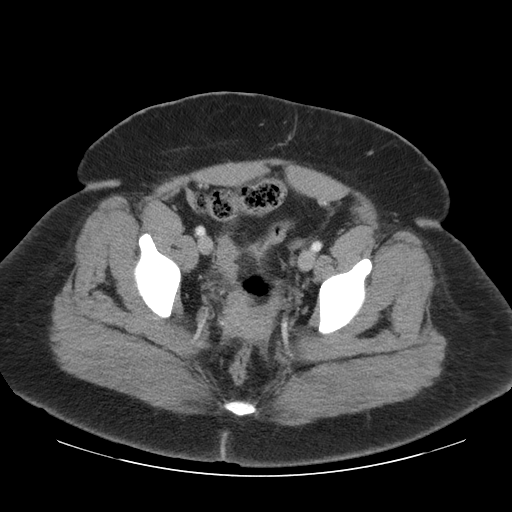
[im 32/89  soft-tissue]
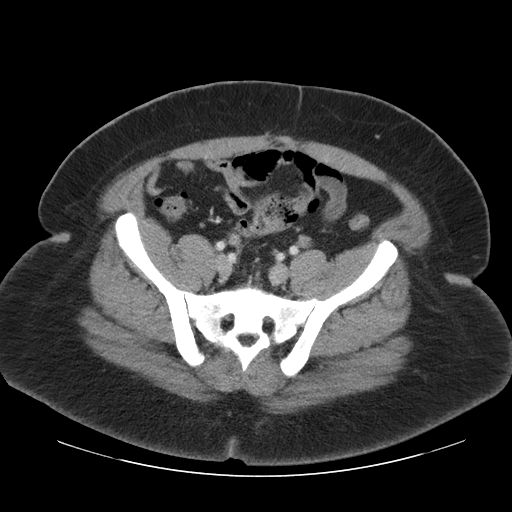
[im 42/89  soft-tissue]
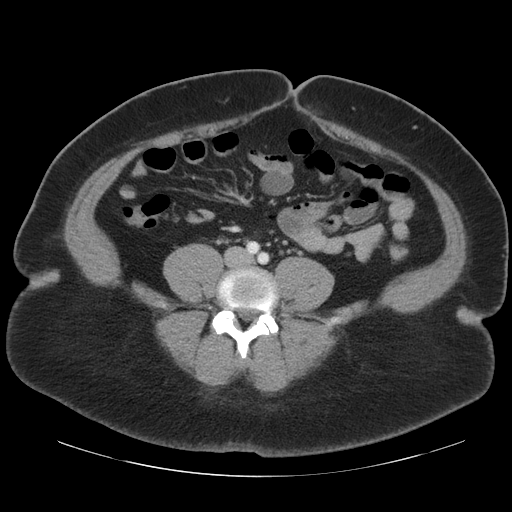
[im 52/89  soft-tissue]
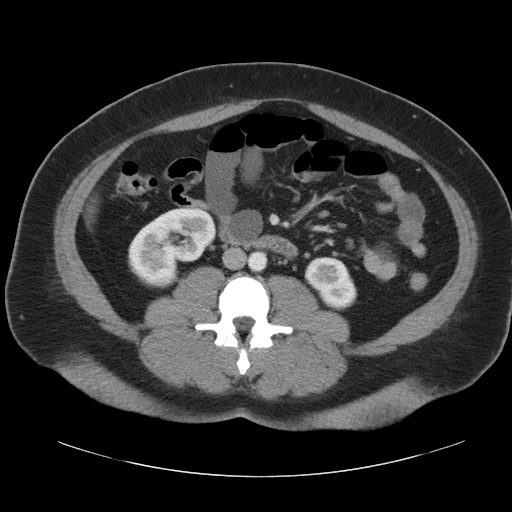
[im 63/89  soft-tissue]
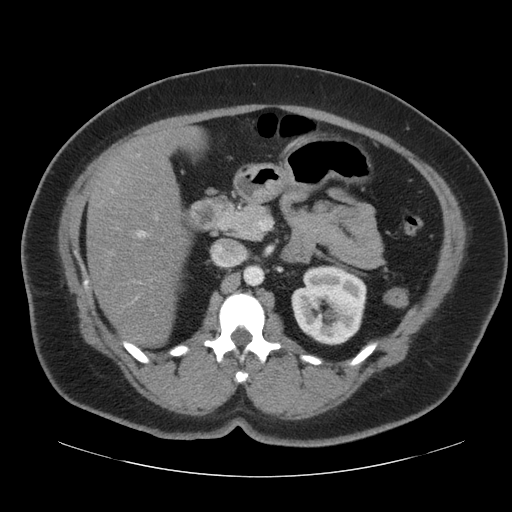
[im 73/89  soft-tissue]
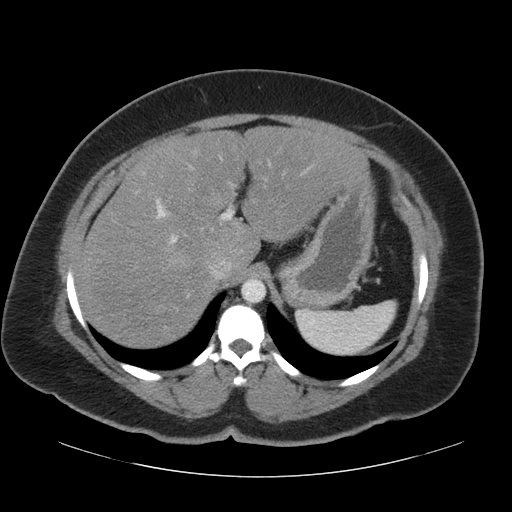
[im 83/89  soft-tissue]
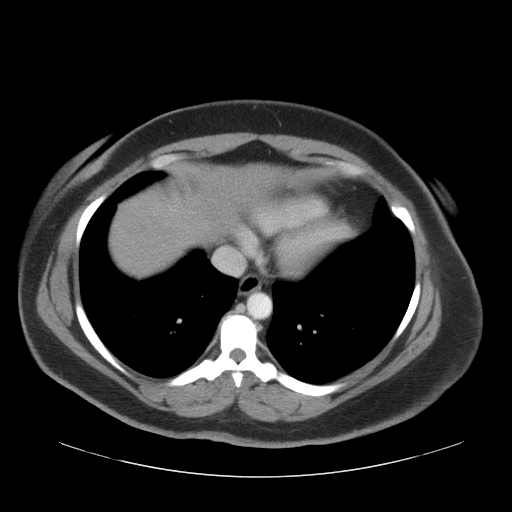

[Series 203: coronals, idose (2) · coronal · 0.45mm/px · 3 of 143 slices shown]
[im 48/143  soft-tissue]
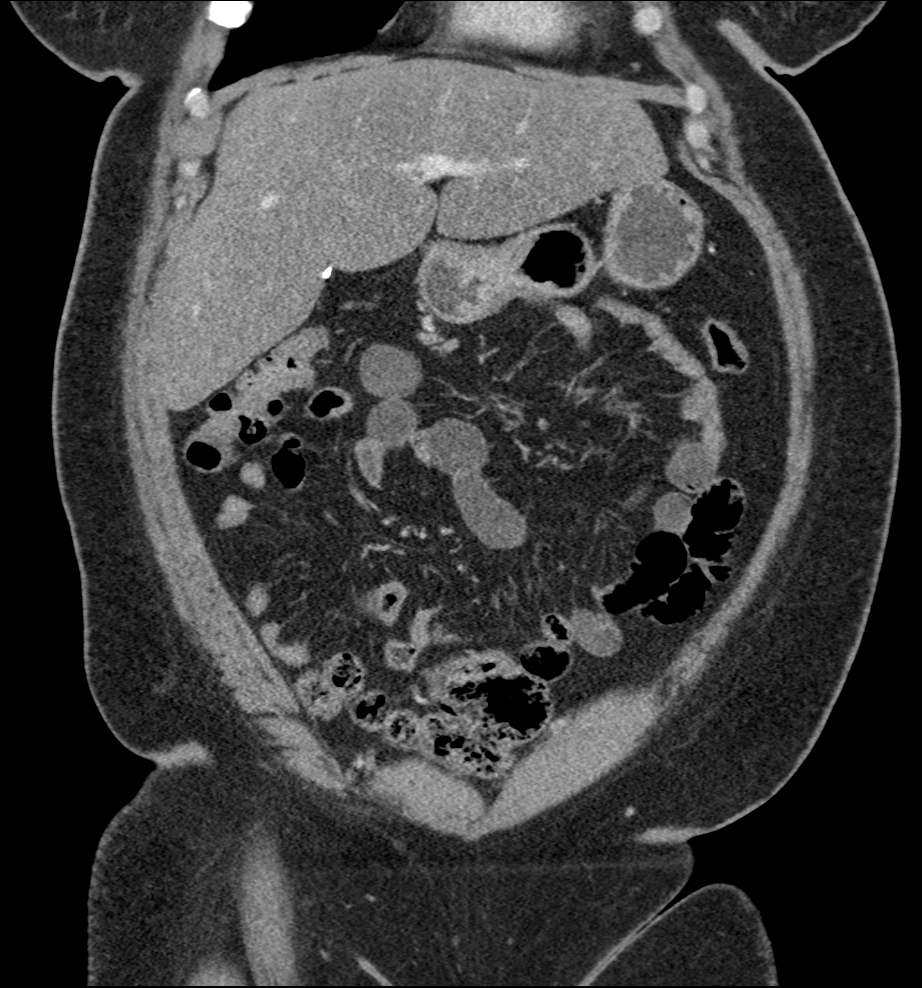
[im 64/143  soft-tissue]
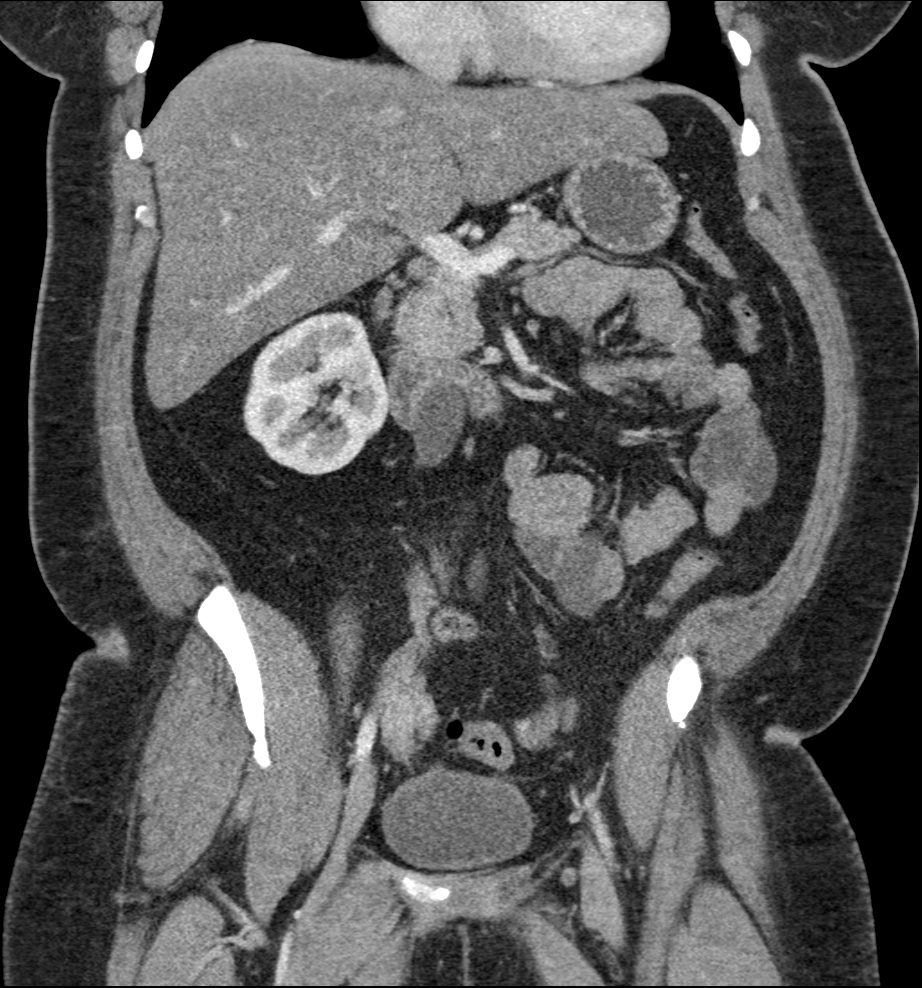
[im 79/143  soft-tissue]
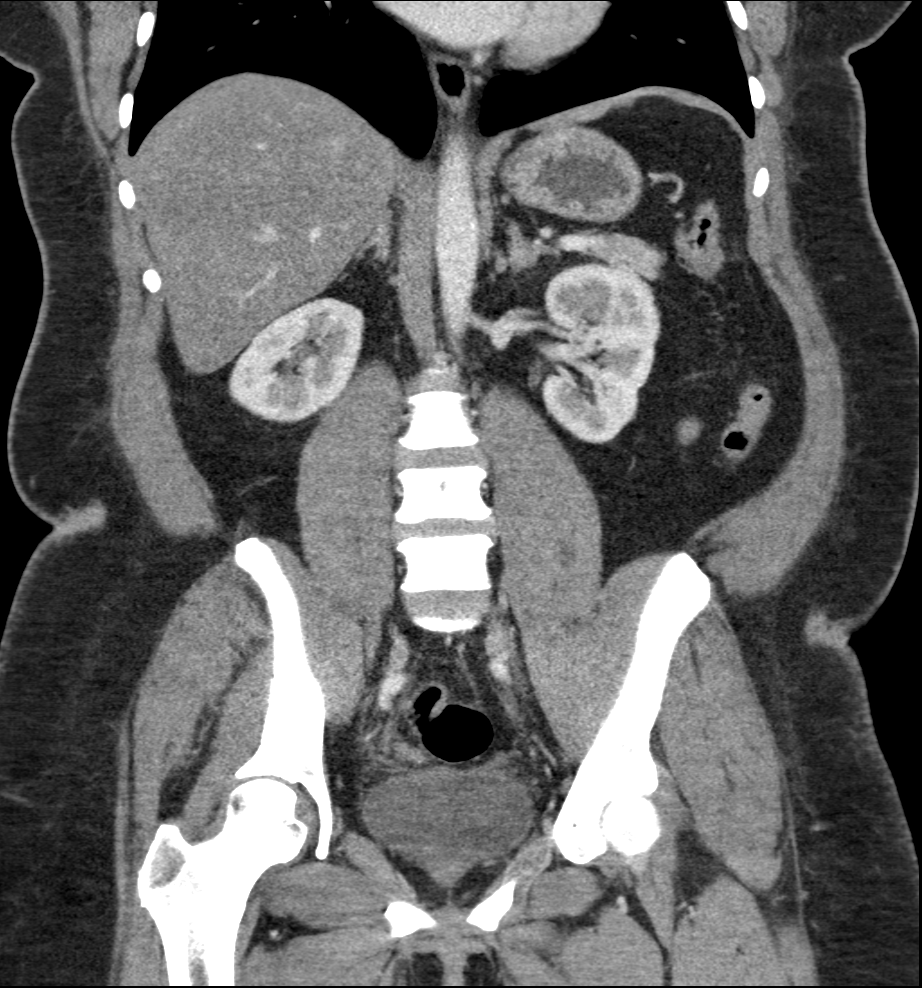

[11 of 46 positions shown; findings below may reference images not displayed]

FINDINGS: Lower chest and abdominal wall: Laparotomy scar with upper
incisional laxity of the abdominal wall.

Hepatobiliary: Hepatic steatosis that is patchy.Cholecystectomy with
normal common bile duct diameter.

Pancreas: Unremarkable.

Spleen: Unremarkable.

Adrenals/Urinary Tract: Negative adrenals. No hydronephrosis or
stone. Unremarkable bladder.

Reproductive:Hysterectomy with negative adnexa.

Stomach/Bowel: No obstruction. Appendectomy. No inflammatory wall
thickening.

Vascular/Lymphatic: No acute vascular abnormality. No mass or
adenopathy.

Peritoneal: No ascites or pneumoperitoneum.

Musculoskeletal: No acute finding. Lower lumbar facet arthropathy.
Lumbar disc bulging most notably at L4-5.
IMPRESSION: 1. No explanation for acute pain.
2. Hepatic steatosis.

## 2016-05-21 ENCOUNTER — Telehealth: Payer: Self-pay | Admitting: Endocrinology

## 2016-05-21 MED ORDER — ACCU-CHEK AVIVA PLUS W/DEVICE KIT: PACK | 2 refills | Status: DC

## 2016-05-21 NOTE — Telephone Encounter (Signed)
PT needs a new meter sent in to the [facility] on [address] [facility].  Accu Check Aviva Plus

## 2016-05-21 NOTE — Telephone Encounter (Signed)
Prescription submitted.

## 2016-05-27 ENCOUNTER — Ambulatory Visit (INDEPENDENT_AMBULATORY_CARE_PROVIDER_SITE_OTHER): Payer: Medicare Other | Admitting: Endocrinology

## 2016-05-27 ENCOUNTER — Encounter: Payer: Self-pay | Admitting: Endocrinology

## 2016-05-27 VITALS — BP 122/82 | HR 62 | Ht 61.0 in | Wt 233.0 lb

## 2016-05-27 DIAGNOSIS — E119 Type 2 diabetes mellitus without complications: Secondary | ICD-10-CM | POA: Diagnosis not present

## 2016-05-27 DIAGNOSIS — Z794 Long term (current) use of insulin: Secondary | ICD-10-CM

## 2016-05-27 LAB — POCT GLYCOSYLATED HEMOGLOBIN (HGB A1C): HEMOGLOBIN A1C: 7.2

## 2016-05-27 MED ORDER — REPAGLINIDE 2 MG PO TABS: ORAL_TABLET | ORAL | 11 refills | Status: DC

## 2016-05-27 MED ORDER — CLOTRIMAZOLE-BETAMETHASONE 1-0.05 % EX CREA: 1.0000 "application " | TOPICAL_CREAM | Freq: Three times a day (TID) | CUTANEOUS | 2 refills | Status: DC | PRN

## 2016-05-27 NOTE — Patient Instructions (Addendum)
I have sent a prescription to your pharmacy, for a skin cream for your feet.    Change the insulin to "repaglinide," 1 pill with breakfast and lunch, and 2 with supper.   Please come back for a follow-up appointment in 2 months.  check your blood sugar once a day.  vary the time of day when you check, between before the 3 meals, and at bedtime.  also check if you have symptoms of your blood sugar being too high or too low.  please keep a record of the readings and bring it to your next appointment here (or you can bring the meter itself).  You can write it on any piece of paper.  please call us sooner if your blood sugar goes below 70, or if you have a lot of readings over 200.

## 2016-05-27 NOTE — Progress Notes (Signed)
Subjective:    Patient ID: [name], female    DOB: 1971/04/02, 45 y.o.   MRN: [id number]  HPI Pt returns for f/u of diabetes mellitus: DM type: Insulin-requiring type 2 Dx'ed: 3300 Complications: painful polyneuropathy Therapy: insulin (since 7622), trulicity, and farxiga.  GDM: never DKA: never Severe hypoglycemia: never Pancreatitis: never Other: she takes multiple daily injections; she declines weight loss surgery; goal is to transition back from insulin to non-insulin rx; edema precludes pioglitizone rx.   Interval history: no cbg record, but states cbg's vary from 67-280.  It is highest at hs  She has slight itching of the feet, and assoc pain.   Past Medical History:  Diagnosis Date  . Arthritis   . Asthma   . Chronic back pain   . Chronic chest pain   . Depression   . DM (diabetes mellitus) ([place])    INSULIN DEPENDENT  . GERD (gastroesophageal reflux disease)   . Headache(784.0)   . Hypertension   . Hypokalemia     Past Surgical History:  Procedure Laterality Date  . APPENDECTOMY    . CESAREAN SECTION     x 3  . CHOLECYSTECTOMY N/A 03/02/2014   Procedure: LAPAROSCOPIC CHOLECYSTECTOMY;  Surgeon: Joyice Faster. Cornett, MD;  Location: [facility];  Service: General;  Laterality: N/A;  . HERNIA REPAIR    . ROTATOR CUFF REPAIR    . VESICOVAGINAL FISTULA CLOSURE W/ TAH  2009    Social History   Social History  . Marital status: Married    Spouse name: N/A  . Number of children: N/A  . Years of education: N/A   Occupational History  . Not on file.   Social History Main Topics  . Smoking status: Current Every Day Smoker    Packs/day: 0.25    Years: 22.00    Types: Cigarettes  . Smokeless tobacco: Never Used  . Alcohol use 0.0 oz/week     Comment: socially  . Drug use: No  . Sexual activity: Yes    Partners: Male   Other Topics Concern  . Not on file   Social History Narrative  . No narrative on file    Current Outpatient  Prescriptions on File Prior to Visit  Medication Sig Dispense Refill  . albuterol (PROVENTIL HFA;VENTOLIN HFA) 108 (90 Base) MCG/ACT inhaler Inhale 2 puffs into the lungs every 6 (six) hours as needed for wheezing or shortness of breath. Reported on 02/22/2016 1 each 3  . albuterol (PROVENTIL) (2.5 MG/3ML) 0.083% nebulizer solution Take 3 mLs (2.5 mg total) by nebulization every 6 (six) hours as needed for wheezing or shortness of breath. Reported on 02/22/2016 75 mL 3  . aspirin EC 325 MG tablet Take 325 mg by mouth daily.    . Aspirin-Acetaminophen-Caffeine (GOODY HEADACHE PO) Take 1 Package by mouth daily as needed (for leg pain).    . Blood Glucose Monitoring Suppl (ACCU-CHEK AVIVA PLUS) w/Device KIT Use to check blood sugar 3 times per day. DX CODE: E11.9 1 kit 2  . carvedilol (COREG) 6.25 MG tablet TAKE 1 TABLET BY MOUTH 2 TIMES DAILY WITH A MEAL. Must have Office visit for refills 6.25 tablet 3  . dapagliflozin propanediol (FARXIGA) 10 MG TABS tablet Take 10 mg by mouth daily. 30 tablet 11  . Dulaglutide (TRULICITY) 1.5 QJ/3.3LK SOPN Inject 1.5 mg into the skin once a week. 4 pen 11  . Fluticasone-Salmeterol (ADVAIR) 250-50 MCG/DOSE AEPB Inhale 1 puff into the lungs 2 times daily at  12 noon and 4 pm. 60 each 3  . gabapentin (NEURONTIN) 300 MG capsule Take 1 capsule (300 mg total) by mouth 3 (three) times daily. 90 capsule 0  . glucose blood (ACCU-CHEK AVIVA PLUS) test strip Use to check blood sugar 3 times per day.DX Code E11.9 100 each 12  . hydrochlorothiazide (HYDRODIURIL) 25 MG tablet Take 1 tablet (25 mg total) by mouth daily. 30 tablet 4  . ibuprofen (ADVIL,MOTRIN) 800 MG tablet Take 1 tablet (800 mg total) by mouth 3 (three) times daily. 21 tablet 0  . Insulin Pen Needle 32G X 4 MM MISC Use to inject insulin 3 times per day. 100 each 2  . Lancet Devices (ACCU-CHEK SOFTCLIX) lancets Use to check blood sugar 3 times per day dx code E11.9 100 each 2  . Lancets (ACCU-CHEK MULTICLIX) lancets  Use to check blood sugar 3 times per day. DX code E11.9 100 each 12  . losartan (COZAAR) 100 MG tablet Take 1 tablet (100 mg total) by mouth daily. For Blood pressure - must have office visit for refills 30 tablet 0  . meloxicam (MOBIC) 7.5 MG tablet Take 7.5 mg by mouth daily.    . pantoprazole (PROTONIX) 40 MG tablet Take 1 tablet (40 mg total) by mouth daily. 30 tablet 3  . sertraline (ZOLOFT) 50 MG tablet Take 1 tablet (50 mg total) by mouth daily. 30 tablet 3  . simvastatin (ZOCOR) 10 MG tablet Take 1 tablet (10 mg total) by mouth daily at 6 PM. 30 tablet 3  . SUMAtriptan (IMITREX) 100 MG tablet TAKE 1TAB AT EARLIEST ONSET OF HEADACHE. MAY REPEAT X1 IN 2 HOURS IF HEADACHE PERSISTS OR RECURS. DO NOT EXCEED 2 TABS IN 24HRS 9 tablet 2  . [DISCONTINUED] promethazine (PHENERGAN) 25 MG tablet Take 1 tablet (25 mg total) by mouth every 6 (six) hours as needed for nausea. (Patient not taking: Reported on 12/13/2014) 20 tablet 0   No current facility-administered medications on file prior to visit.     Allergies  Allergen Reactions  . Reglan [Metoclopramide] Other (See Comments)    Panic attack    Family History  Problem Relation Age of Onset  . Heart attack Mother   . Stroke Mother   . Diabetes Mother   . Hypertension Mother   . Arthritis Mother   . Stroke Father   . Hypertension Sister   . Diabetes Sister   . Seizures Brother   . Diabetes Brother     BP 122/82   Pulse 62   Ht _0  (1.549 m)   Wt 233 lb (105.7 kg)   BMI 44.02 kg/m    Review of Systems Denies LOC and sob.      Objective:   Physical Exam VITAL SIGNS:  See vs page GENERAL: no distress Pulses: dorsalis pedis intact bilat.   MSK: no deformity of the feet CV: trace bilat leg edema Skin:  no ulcer on the feet.  No rash on the feet.  normal color and temp on the feet.  Neuro: sensation is intact to touch on the feet, but decreased from normal.     A1c=7.2%    Assessment & Plan:  Type 2 DM: he is ready to  d/c insulin.  Itching of feet, new, which could be related to polyneuropathy, but not sure.

## 2016-05-29 ENCOUNTER — Telehealth: Payer: Self-pay | Admitting: Endocrinology

## 2016-05-29 MED ORDER — DULAGLUTIDE 1.5 MG/0.5ML ~~LOC~~ SOAJ: 1.5000 mg | SUBCUTANEOUS | 11 refills | Status: DC

## 2016-05-29 NOTE — Telephone Encounter (Signed)
Refill submitted. 

## 2016-05-29 NOTE — Telephone Encounter (Signed)
Pt needs [place] to call in to the [facility] on [address] the once a week inj please she cannot remember the name

## 2016-06-13 ENCOUNTER — Ambulatory Visit: Payer: Medicare Other | Admitting: Family Medicine

## 2016-06-23 ENCOUNTER — Encounter (HOSPITAL_COMMUNITY): Payer: Self-pay | Admitting: Emergency Medicine

## 2016-06-23 ENCOUNTER — Emergency Department (HOSPITAL_COMMUNITY)
Admission: EM | Admit: 2016-06-23 | Discharge: 2016-06-23 | Disposition: A | Discharge status: Home / Self Care | Payer: Medicare Other | Attending: Emergency Medicine | Admitting: Emergency Medicine

## 2016-06-23 DIAGNOSIS — J45909 Unspecified asthma, uncomplicated: Secondary | ICD-10-CM | POA: Diagnosis not present

## 2016-06-23 DIAGNOSIS — M5431 Sciatica, right side: Secondary | ICD-10-CM

## 2016-06-23 DIAGNOSIS — L723 Sebaceous cyst: Secondary | ICD-10-CM | POA: Insufficient documentation

## 2016-06-23 DIAGNOSIS — F17210 Nicotine dependence, cigarettes, uncomplicated: Secondary | ICD-10-CM | POA: Insufficient documentation

## 2016-06-23 DIAGNOSIS — M5441 Lumbago with sciatica, right side: Secondary | ICD-10-CM | POA: Insufficient documentation

## 2016-06-23 DIAGNOSIS — Z7982 Long term (current) use of aspirin: Secondary | ICD-10-CM | POA: Diagnosis not present

## 2016-06-23 DIAGNOSIS — H9202 Otalgia, left ear: Secondary | ICD-10-CM | POA: Diagnosis not present

## 2016-06-23 DIAGNOSIS — Z794 Long term (current) use of insulin: Secondary | ICD-10-CM | POA: Insufficient documentation

## 2016-06-23 DIAGNOSIS — M5442 Lumbago with sciatica, left side: Secondary | ICD-10-CM | POA: Diagnosis not present

## 2016-06-23 DIAGNOSIS — Z79899 Other long term (current) drug therapy: Secondary | ICD-10-CM | POA: Insufficient documentation

## 2016-06-23 DIAGNOSIS — I10 Essential (primary) hypertension: Secondary | ICD-10-CM | POA: Diagnosis not present

## 2016-06-23 DIAGNOSIS — E119 Type 2 diabetes mellitus without complications: Secondary | ICD-10-CM | POA: Diagnosis not present

## 2016-06-23 DIAGNOSIS — L089 Local infection of the skin and subcutaneous tissue, unspecified: Secondary | ICD-10-CM

## 2016-06-23 DIAGNOSIS — H6002 Abscess of left external ear: Secondary | ICD-10-CM | POA: Diagnosis not present

## 2016-06-23 DIAGNOSIS — M5432 Sciatica, left side: Secondary | ICD-10-CM

## 2016-06-23 MED ORDER — KETOROLAC TROMETHAMINE 60 MG/2ML IM SOLN
60.0000 mg | Freq: Once | INTRAMUSCULAR | Status: AC
  Administered 2016-06-23: 60 mg via INTRAMUSCULAR
  Filled 2016-06-23: qty 2

## 2016-06-23 MED ORDER — NAPROXEN 500 MG PO TABS: 500.0000 mg | ORAL_TABLET | Freq: Two times a day (BID) | ORAL | 0 refills | Status: DC

## 2016-06-23 MED ORDER — LIDOCAINE HCL (PF) 1 % IJ SOLN
20.0000 mL | Freq: Once | INTRAMUSCULAR | Status: AC
  Administered 2016-06-23: 20 mL via INTRADERMAL
  Filled 2016-06-23: qty 20

## 2016-06-23 MED ORDER — OXYCODONE-ACETAMINOPHEN 5-325 MG PO TABS: 1.0000 | ORAL_TABLET | ORAL | 0 refills | Status: DC | PRN

## 2016-06-23 MED ORDER — OXYCODONE-ACETAMINOPHEN 5-325 MG PO TABS
2.0000 | ORAL_TABLET | Freq: Once | ORAL | Status: AC
  Administered 2016-06-23: 2 via ORAL
  Filled 2016-06-23: qty 2

## 2016-06-23 MED ORDER — DEXAMETHASONE SODIUM PHOSPHATE 10 MG/ML IJ SOLN
10.0000 mg | Freq: Once | INTRAMUSCULAR | Status: AC
  Administered 2016-06-23: 10 mg via INTRAMUSCULAR
  Filled 2016-06-23: qty 1

## 2016-06-23 MED ORDER — ONDANSETRON 4 MG PO TBDP
4.0000 mg | ORAL_TABLET | Freq: Once | ORAL | Status: AC
  Administered 2016-06-23: 4 mg via ORAL
  Filled 2016-06-23: qty 1

## 2016-06-23 NOTE — ED Triage Notes (Signed)
Pt states she had a "pinched nerve" in her back and has been having increase in right hip pain over the last 3 days. Pt denies any numbness of tingling in legs "just a lot of pain" Pt ambulatory in triage. Pt has what appears to be an abscess in left ear.

## 2016-06-23 NOTE — Discharge Instructions (Signed)
SEEK IMMEDIATE MEDICAL ATTENTION IF: New numbness, tingling, weakness, or problem with the use of your arms or legs.  Severe back pain not relieved with medications.  Change in bowel or bladder control.  Increasing pain in any areas of the body (such as chest or abdominal pain).  Shortness of breath, dizziness or fainting.  Nausea (feeling sick to your stomach), vomiting, fever, or sweats.  

## 2016-06-23 NOTE — ED Provider Notes (Signed)
[facility] DEPT Provider Note   CSN: [id number] Arrival date & time: 06/23/16  1148  By signing my name below, I, Higinio Plan, attest that this documentation has been prepared under the direction and in the presence of non-physician practitioner, Margarita Mail, PA-C. Electronically Signed: Higinio Plan, Scribe. 06/23/2016. 3:16 PM.  History   Chief Complaint Chief Complaint  Patient presents with  . Hip Pain  . Otalgia   The history is provided by the patient. No language interpreter was used.   HPI Comments: [name] is a 45 y.o. female with PMHx of DM and chronic back pain, who presents to the Emergency Department complaining of gradually worsening, left ear pain that began 3 days ago. Pt reports she noticed a small blackhead in her ear a few days ago and woke up with severe, "throbbing" pain when she awoke this morning. She states she has tried to clean her ear with rubbing alcohol and applied a heating pad with no relief.   Pt also complains of gradually worsening, intermittent, right hip pain that began 3 days ago and worsened today. She reports her pain begins in her right hip and radiates into her buttocks and bilateral legs. She states associated intermittent "muscle spasms" throughout her back. She notes she has a follow-up appointment with her PCP on 06/27/16.   PPC: Dr. Jarold Song  Past Medical History:  Diagnosis Date  . Arthritis   . Asthma   . Chronic back pain   . Chronic chest pain   . Depression   . DM (diabetes mellitus) ([place])    INSULIN DEPENDENT  . GERD (gastroesophageal reflux disease)   . Headache(784.0)   . Hypertension   . Hypokalemia     Patient Active Problem List   Diagnosis Date Noted  . Gastritis and gastroduodenitis 02/22/2016  . Hyperlipidemia 02/22/2016  . Depression 02/22/2016  . Migraine 01/30/2015  . Headache 01/30/2015  . URI (upper respiratory infection) 06/04/2014  . Atypical chest pain 02/26/2014  . DM type 2  (diabetes mellitus, type 2) ([place]) 02/26/2014  . Stable asthma 02/26/2014  . Chest pain 11/10/2013  . Hypertensive urgency 11/10/2013  . Back pain 09/05/2013  . Essential hypertension, benign 09/05/2013  . Acute bronchitis 05/01/2013  . Asthma exacerbation 04/27/2013  . Uncontrolled diabetes mellitus ([place]) 04/27/2013  . Obesity 04/27/2013  . Allergic rhinitis, seasonal 08/11/2012  . Chronic cough 08/11/2012  . Sleep apnea, obstructive 12/04/2011    Past Surgical History:  Procedure Laterality Date  . APPENDECTOMY    . CESAREAN SECTION     x 3  . CHOLECYSTECTOMY N/A 03/02/2014   Procedure: LAPAROSCOPIC CHOLECYSTECTOMY;  Surgeon: Joyice Faster. Cornett, MD;  Location: [facility];  Service: General;  Laterality: N/A;  . HERNIA REPAIR    . ROTATOR CUFF REPAIR    . VESICOVAGINAL FISTULA CLOSURE W/ TAH  2009    OB History    No data available     Home Medications    Prior to Admission medications   Medication Sig Start Date End Date Taking? Authorizing Provider  albuterol (PROVENTIL HFA;VENTOLIN HFA) 108 (90 Base) MCG/ACT inhaler Inhale 2 puffs into the lungs every 6 (six) hours as needed for wheezing or shortness of breath. Reported on 02/22/2016 02/22/16   Arnoldo Morale, MD  albuterol (PROVENTIL) (2.5 MG/3ML) 0.083% nebulizer solution Take 3 mLs (2.5 mg total) by nebulization every 6 (six) hours as needed for wheezing or shortness of breath. Reported on 02/22/2016 02/22/16   Arnoldo Morale, MD  aspirin  EC 325 MG tablet Take 325 mg by mouth daily.    Historical Provider, MD  Aspirin-Acetaminophen-Caffeine (GOODY HEADACHE PO) Take 1 Package by mouth daily as needed (for leg pain).    Historical Provider, MD  Blood Glucose Monitoring Suppl (ACCU-CHEK AVIVA PLUS) w/Device KIT Use to check blood sugar 3 times per day. DX CODE: E11.9 05/21/16   Renato Shin, MD  carvedilol (COREG) 6.25 MG tablet TAKE 1 TABLET BY MOUTH 2 TIMES DAILY WITH A MEAL. Must have Office visit for refills 02/22/16   Arnoldo Morale, MD    clotrimazole-betamethasone (LOTRISONE) cream Apply 1 application topically 3 (three) times daily as needed. For itching 05/27/16   Renato Shin, MD  dapagliflozin propanediol (FARXIGA) 10 MG TABS tablet Take 10 mg by mouth daily. 01/25/16   Renato Shin, MD  Dulaglutide (TRULICITY) 1.5 ZH/2.9JM SOPN Inject 1.5 mg into the skin once a week. 05/29/16   Renato Shin, MD  Fluticasone-Salmeterol (ADVAIR) 250-50 MCG/DOSE AEPB Inhale 1 puff into the lungs 2 times daily at 12 noon and 4 pm. 02/22/16   Arnoldo Morale, MD  gabapentin (NEURONTIN) 300 MG capsule Take 1 capsule (300 mg total) by mouth 3 (three) times daily. 02/22/16   Arnoldo Morale, MD  glucose blood (ACCU-CHEK AVIVA PLUS) test strip Use to check blood sugar 3 times per day.DX Code E11.9 08/24/15   Renato Shin, MD  hydrochlorothiazide (HYDRODIURIL) 25 MG tablet Take 1 tablet (25 mg total) by mouth daily. 01/28/16   Tresa Garter, MD  ibuprofen (ADVIL,MOTRIN) 800 MG tablet Take 1 tablet (800 mg total) by mouth 3 (three) times daily. 10/12/15   Leo Grosser, MD  Insulin Pen Needle 32G X 4 MM MISC Use to inject insulin 3 times per day. 10/29/15   Renato Shin, MD  Lancet Devices [facility]) lancets Use to check blood sugar 3 times per day dx code E11.9 09/18/15   Elayne Snare, MD  Lancets (ACCU-CHEK MULTICLIX) lancets Use to check blood sugar 3 times per day. DX code E11.9 08/24/15   Renato Shin, MD  losartan (COZAAR) 100 MG tablet Take 1 tablet (100 mg total) by mouth daily. For Blood pressure - must have office visit for refills 02/22/16   Arnoldo Morale, MD  meloxicam (MOBIC) 7.5 MG tablet Take 7.5 mg by mouth daily.    Historical Provider, MD  pantoprazole (PROTONIX) 40 MG tablet Take 1 tablet (40 mg total) by mouth daily. 02/22/16   Arnoldo Morale, MD  repaglinide (PRANDIN) 2 MG tablet 1 tab with breakfast and lunch, and 2 tabs with dinner. 05/27/16   Renato Shin, MD  sertraline (ZOLOFT) 50 MG tablet Take 1 tablet (50 mg total) by mouth daily. 02/22/16    Arnoldo Morale, MD  simvastatin (ZOCOR) 10 MG tablet Take 1 tablet (10 mg total) by mouth daily at 6 PM. 02/22/16   Arnoldo Morale, MD  SUMAtriptan (IMITREX) 100 MG tablet TAKE 1TAB AT EARLIEST ONSET OF HEADACHE. MAY REPEAT X1 IN 2 HOURS IF HEADACHE PERSISTS OR RECURS. DO NOT EXCEED 2 TABS IN 24HRS 02/22/16   Arnoldo Morale, MD    Family History Family History  Problem Relation Age of Onset  . Heart attack Mother   . Stroke Mother   . Diabetes Mother   . Hypertension Mother   . Arthritis Mother   . Stroke Father   . Hypertension Sister   . Diabetes Sister   . Seizures Brother   . Diabetes Brother     Social History Social History  Substance Use Topics  . Smoking status: Current Every Day Smoker    Packs/day: 0.25    Years: 22.00    Types: Cigarettes  . Smokeless tobacco: Never Used  . Alcohol use 0.0 oz/week     Comment: socially     Allergies   Reglan [metoclopramide]   Review of Systems Review of Systems  HENT: Positive for ear pain.   Musculoskeletal: Positive for back pain and myalgias.   Physical Exam Updated Vital Signs BP 136/90 (BP Location: Right Arm)   Pulse 86   Temp 98.2 F (36.8 C) (Oral)   Resp 16   Ht _0  (1.651 m)   Wt 233 lb (105.7 kg)   SpO2 99%   BMI 38.77 kg/m   Physical Exam  Constitutional: She is oriented to person, place, and time. She appears well-developed and well-nourished.  HENT:  Head: Normocephalic.  Right Ear: External ear normal.  Left ear with swelling. There is an abscess behind the left tragus. Swelling of the pinna. No signs of otitis extrerna. Normal right ear exam.  Eyes: EOM are normal.  Neck: Normal range of motion.  Pulmonary/Chest: Effort normal.  Abdominal: She exhibits no distension.  Musculoskeletal: Normal range of motion. She exhibits tenderness.  Bilateral lower paraspinal tenderness. Positive straight leg test on right. Intermittently wincing in pain.   Neurological: She is alert and oriented to person,  place, and time.  Psychiatric: She has a normal mood and affect.  Nursing note and vitals reviewed.  ED Treatments / Results  Labs (all labs ordered are listed, but only abnormal results are displayed) Labs Reviewed - No data to display  EKG  EKG Interpretation None       Radiology No results found.  Procedures Procedures (including critical care time) INCISION AND DRAINAGE PROCEDURE NOTE: Patient identification was confirmed and verbal consent was obtained. This procedure was performed by Margarita Mail, PA-C at 2:44 PM. Site: left ear  Sterile procedures observed Anesthetic used (type and amt): 1% lidocaine without epi  Blade size: 11 blade  Drainage: purulent and also sebaceous  Complexity: Simple  Site anesthetized, incision made over site, wound drained and explored loculations, rinsed with copious amounts of normal saline, wound packed with sterile gauze, covered with dry, sterile dressing.  Pt tolerated procedure well without complications.  Instructions for care discussed verbally and pt provided with additional written instructions for homecare and f/u.  Medications Ordered in ED Medications - No data to display  DIAGNOSTIC STUDIES:  Oxygen Saturation is 99% on RA, normal by my interpretation.    COORDINATION OF CARE:  1:38 PM Discussed treatment plan with pt at bedside and pt agreed to plan.  Initial Impression / Assessment and Plan / ED Course  I have reviewed the triage vital signs and the nursing notes.  Pertinent labs & imaging results that were available during my care of the patient were reviewed by me and considered in my medical decision making (see chart for details).  Clinical Course    Patient with skin abscess amenable to incision and drainage.  Abscess was not large enough to warrant packing or drain,  wound recheck in 2 days. Encouraged home warm soaks and flushing.  Mild signs of cellulitis is surrounding skin.  Will d/c to home.  No  antibiotic therapy is indicated.  Patient with back pain.  No neurological deficits and normal neuro exam.  Patient can walk but states is painful.  No loss of bowel or bladder control.  No concern for cauda equina.  No fever, night sweats, weight loss, h/o cancer, IVDU.  RICE protocol and pain medicine indicated and discussed with patient.    I personally performed the services described in this documentation, which was scribed in my presence. The recorded information has been reviewed and is accurate.   Final Clinical Impressions(s) / ED Diagnoses   Final diagnoses:  None    New Prescriptions New Prescriptions   No medications on file     Margarita Mail, PA-C 06/25/16 [place], MD 06/25/16 2007

## 2016-06-25 ENCOUNTER — Telehealth: Payer: Self-pay | Admitting: Family Medicine

## 2016-06-25 NOTE — Telephone Encounter (Signed)
Patient went to the ED, and states she was advised to contact her PCP and get an MRI ordered.  Please follow up with patient

## 2016-06-26 NOTE — Telephone Encounter (Signed)
Patient has an appt scheduled for 07/02/16.

## 2016-06-26 NOTE — Telephone Encounter (Signed)
She needs an office visit 

## 2016-06-27 ENCOUNTER — Ambulatory Visit: Payer: Medicare Other | Admitting: Family Medicine

## 2016-07-02 ENCOUNTER — Ambulatory Visit: Payer: Medicare Other | Attending: Family Medicine | Admitting: Family Medicine

## 2016-07-02 ENCOUNTER — Encounter: Payer: Self-pay | Admitting: Family Medicine

## 2016-07-02 VITALS — BP 136/90 | HR 102 | Temp 98.2°F | Ht 61.0 in | Wt 240.4 lb

## 2016-07-02 DIAGNOSIS — I10 Essential (primary) hypertension: Secondary | ICD-10-CM | POA: Diagnosis not present

## 2016-07-02 DIAGNOSIS — M5441 Lumbago with sciatica, right side: Secondary | ICD-10-CM

## 2016-07-02 DIAGNOSIS — G629 Polyneuropathy, unspecified: Secondary | ICD-10-CM

## 2016-07-02 DIAGNOSIS — Z888 Allergy status to other drugs, medicaments and biological substances status: Secondary | ICD-10-CM | POA: Insufficient documentation

## 2016-07-02 DIAGNOSIS — G43119 Migraine with aura, intractable, without status migrainosus: Secondary | ICD-10-CM | POA: Diagnosis not present

## 2016-07-02 DIAGNOSIS — Z9889 Other specified postprocedural states: Secondary | ICD-10-CM | POA: Diagnosis not present

## 2016-07-02 DIAGNOSIS — Z794 Long term (current) use of insulin: Secondary | ICD-10-CM | POA: Insufficient documentation

## 2016-07-02 DIAGNOSIS — Z79899 Other long term (current) drug therapy: Secondary | ICD-10-CM | POA: Diagnosis not present

## 2016-07-02 DIAGNOSIS — Z23 Encounter for immunization: Secondary | ICD-10-CM | POA: Insufficient documentation

## 2016-07-02 DIAGNOSIS — K219 Gastro-esophageal reflux disease without esophagitis: Secondary | ICD-10-CM | POA: Insufficient documentation

## 2016-07-02 DIAGNOSIS — E876 Hypokalemia: Secondary | ICD-10-CM | POA: Diagnosis not present

## 2016-07-02 DIAGNOSIS — Z7982 Long term (current) use of aspirin: Secondary | ICD-10-CM | POA: Diagnosis not present

## 2016-07-02 DIAGNOSIS — J4530 Mild persistent asthma, uncomplicated: Secondary | ICD-10-CM | POA: Insufficient documentation

## 2016-07-02 DIAGNOSIS — K2990 Gastroduodenitis, unspecified, without bleeding: Secondary | ICD-10-CM | POA: Diagnosis not present

## 2016-07-02 DIAGNOSIS — E119 Type 2 diabetes mellitus without complications: Secondary | ICD-10-CM

## 2016-07-02 DIAGNOSIS — G8929 Other chronic pain: Secondary | ICD-10-CM | POA: Diagnosis not present

## 2016-07-02 DIAGNOSIS — F329 Major depressive disorder, single episode, unspecified: Secondary | ICD-10-CM | POA: Diagnosis not present

## 2016-07-02 DIAGNOSIS — K2970 Gastritis, unspecified, without bleeding: Secondary | ICD-10-CM

## 2016-07-02 DIAGNOSIS — Z9049 Acquired absence of other specified parts of digestive tract: Secondary | ICD-10-CM | POA: Diagnosis not present

## 2016-07-02 DIAGNOSIS — E1100 Type 2 diabetes mellitus with hyperosmolarity without nonketotic hyperglycemic-hyperosmolar coma (NKHHC): Secondary | ICD-10-CM | POA: Diagnosis not present

## 2016-07-02 DIAGNOSIS — M1990 Unspecified osteoarthritis, unspecified site: Secondary | ICD-10-CM | POA: Diagnosis not present

## 2016-07-02 LAB — POCT GLYCOSYLATED HEMOGLOBIN (HGB A1C): Hemoglobin A1C: 7.6

## 2016-07-02 LAB — GLUCOSE, POCT (MANUAL RESULT ENTRY): POC Glucose: 291 mg/dl — AB (ref 70–99)

## 2016-07-02 MED ORDER — SERTRALINE HCL 50 MG PO TABS: 50.0000 mg | ORAL_TABLET | Freq: Every day | ORAL | 3 refills | Status: DC

## 2016-07-02 MED ORDER — KETOROLAC TROMETHAMINE 60 MG/2ML IM SOLN
60.0000 mg | Freq: Once | INTRAMUSCULAR | Status: AC
  Administered 2016-07-02: 60 mg via INTRAMUSCULAR

## 2016-07-02 MED ORDER — GABAPENTIN 300 MG PO CAPS: 300.0000 mg | ORAL_CAPSULE | Freq: Three times a day (TID) | ORAL | 3 refills | Status: DC

## 2016-07-02 MED ORDER — ACETAMINOPHEN-CODEINE #3 300-30 MG PO TABS: 1.0000 | ORAL_TABLET | Freq: Two times a day (BID) | ORAL | 0 refills | Status: DC | PRN

## 2016-07-02 MED ORDER — FLUTICASONE-SALMETEROL 250-50 MCG/DOSE IN AEPB: 1.0000 | INHALATION_SPRAY | Freq: Two times a day (BID) | RESPIRATORY_TRACT | 3 refills | Status: DC

## 2016-07-02 MED ORDER — ALBUTEROL SULFATE HFA 108 (90 BASE) MCG/ACT IN AERS: 2.0000 | INHALATION_SPRAY | Freq: Four times a day (QID) | RESPIRATORY_TRACT | 3 refills | Status: DC | PRN

## 2016-07-02 MED ORDER — METHOCARBAMOL 500 MG PO TABS: 500.0000 mg | ORAL_TABLET | Freq: Three times a day (TID) | ORAL | 0 refills | Status: DC | PRN

## 2016-07-02 MED ORDER — SUMATRIPTAN SUCCINATE 100 MG PO TABS: ORAL_TABLET | ORAL | 2 refills | Status: DC

## 2016-07-02 MED ORDER — ALBUTEROL SULFATE (2.5 MG/3ML) 0.083% IN NEBU: 2.5000 mg | INHALATION_SOLUTION | Freq: Four times a day (QID) | RESPIRATORY_TRACT | 3 refills | Status: DC | PRN

## 2016-07-02 MED ORDER — LOSARTAN POTASSIUM 100 MG PO TABS: 100.0000 mg | ORAL_TABLET | Freq: Every day | ORAL | 3 refills | Status: DC

## 2016-07-02 MED ORDER — SIMVASTATIN 10 MG PO TABS: 10.0000 mg | ORAL_TABLET | Freq: Every day | ORAL | 3 refills | Status: DC

## 2016-07-02 MED ORDER — PANTOPRAZOLE SODIUM 40 MG PO TBEC: 40.0000 mg | DELAYED_RELEASE_TABLET | Freq: Every day | ORAL | 3 refills | Status: DC

## 2016-07-02 MED ORDER — TOPIRAMATE 100 MG PO TABS: 100.0000 mg | ORAL_TABLET | Freq: Two times a day (BID) | ORAL | 3 refills | Status: DC

## 2016-07-02 NOTE — Patient Instructions (Signed)

## 2016-07-03 NOTE — Progress Notes (Signed)
Subjective:  Patient ID: [name], female    DOB: 1971-08-07  Age: 45 y.o. MRN: [id number]  CC: Diabetes; Hypertension; and Back Pain   [name]  is a 45 year old female with a history of type 2 diabetes mellitus (A1c 7.6 form today), hypertension, asthma, depression who comes into the clinic for follow-up visit.   She has been compliant with her diabetic regimen and is closely followed by [name] endocrine and denies any hypoglycemia or numbness in extremities. Asthma has also been controlled with no recent exacerbation, shortness of breath or chest pain.  Remains on Imitrex for breakthrough migraines but migraines are not controlled.Takes  Zoloft for depression.  Complains of a 2 week history of right-sided low back pain radiating into her right buttock and her right lower extremity. Symptoms have been on for years and have been intermittent but states over the last few weeks this has worsened. She was recently seen at the ED and given Percocet. She presents today requesting an MRI of her low back. Denies loss of sphincteric function, falls or weakness in lower extremities.   Past Medical History:  Diagnosis Date  . Arthritis   . Asthma   . Chronic back pain   . Chronic chest pain   . Depression   . DM (diabetes mellitus) ([place])    INSULIN DEPENDENT  . GERD (gastroesophageal reflux disease)   . Headache(784.0)   . Hypertension   . Hypokalemia     Past Surgical History:  Procedure Laterality Date  . APPENDECTOMY    . CESAREAN SECTION     x 3  . CHOLECYSTECTOMY N/A 03/02/2014   Procedure: LAPAROSCOPIC CHOLECYSTECTOMY;  Surgeon: Joyice Faster. Cornett, MD;  Location: [facility];  Service: General;  Laterality: N/A;  . HERNIA REPAIR    . ROTATOR CUFF REPAIR    . VESICOVAGINAL FISTULA CLOSURE W/ TAH  2009    Allergies  Allergen Reactions  . Reglan [Metoclopramide] Other (See Comments)    Panic attack     Outpatient Medications  Prior to Visit  Medication Sig Dispense Refill  . aspirin EC 325 MG tablet Take 325 mg by mouth daily.    . Aspirin-Acetaminophen-Caffeine (GOODY HEADACHE PO) Take 1 Package by mouth daily as needed (for leg pain).    . Blood Glucose Monitoring Suppl (ACCU-CHEK AVIVA PLUS) w/Device KIT Use to check blood sugar 3 times per day. DX CODE: E11.9 1 kit 2  . carvedilol (COREG) 6.25 MG tablet TAKE 1 TABLET BY MOUTH 2 TIMES DAILY WITH A MEAL. Must have Office visit for refills 6.25 tablet 3  . dapagliflozin propanediol (FARXIGA) 10 MG TABS tablet Take 10 mg by mouth daily. 30 tablet 11  . Dulaglutide (TRULICITY) 1.5 YW/7.3XT SOPN Inject 1.5 mg into the skin once a week. 4 pen 11  . glucose blood (ACCU-CHEK AVIVA PLUS) test strip Use to check blood sugar 3 times per day.DX Code E11.9 100 each 12  . hydrochlorothiazide (HYDRODIURIL) 25 MG tablet Take 1 tablet (25 mg total) by mouth daily. 30 tablet 4  . Insulin Pen Needle 32G X 4 MM MISC Use to inject insulin 3 times per day. 100 each 2  . Lancet Devices (ACCU-CHEK SOFTCLIX) lancets Use to check blood sugar 3 times per day dx code E11.9 100 each 2  . Lancets (ACCU-CHEK MULTICLIX) lancets Use to check blood sugar 3 times per day. DX code E11.9 100 each 12  . naproxen (NAPROSYN) 500 MG tablet Take 1 tablet (  500 mg total) by mouth 2 (two) times daily with a meal. 30 tablet 0  . oxyCODONE-acetaminophen (PERCOCET) 5-325 MG tablet Take 1-2 tablets by mouth every 4 (four) hours as needed. 20 tablet 0  . repaglinide (PRANDIN) 2 MG tablet 1 tab with breakfast and lunch, and 2 tabs with dinner. 120 tablet 11  . albuterol (PROVENTIL HFA;VENTOLIN HFA) 108 (90 Base) MCG/ACT inhaler Inhale 2 puffs into the lungs every 6 (six) hours as needed for wheezing or shortness of breath. Reported on 02/22/2016 1 each 3  . albuterol (PROVENTIL) (2.5 MG/3ML) 0.083% nebulizer solution Take 3 mLs (2.5 mg total) by nebulization every 6 (six) hours as needed for wheezing or shortness of  breath. Reported on 02/22/2016 75 mL 3  . Fluticasone-Salmeterol (ADVAIR) 250-50 MCG/DOSE AEPB Inhale 1 puff into the lungs 2 times daily at 12 noon and 4 pm. 60 each 3  . gabapentin (NEURONTIN) 300 MG capsule Take 1 capsule (300 mg total) by mouth 3 (three) times daily. 90 capsule 0  . ibuprofen (ADVIL,MOTRIN) 800 MG tablet Take 1 tablet (800 mg total) by mouth 3 (three) times daily. 21 tablet 0  . losartan (COZAAR) 100 MG tablet Take 1 tablet (100 mg total) by mouth daily. For Blood pressure - must have office visit for refills 30 tablet 0  . meloxicam (MOBIC) 7.5 MG tablet Take 7.5 mg by mouth daily.    . pantoprazole (PROTONIX) 40 MG tablet Take 1 tablet (40 mg total) by mouth daily. 30 tablet 3  . sertraline (ZOLOFT) 50 MG tablet Take 1 tablet (50 mg total) by mouth daily. 30 tablet 3  . simvastatin (ZOCOR) 10 MG tablet Take 1 tablet (10 mg total) by mouth daily at 6 PM. 30 tablet 3  . SUMAtriptan (IMITREX) 100 MG tablet TAKE 1TAB AT EARLIEST ONSET OF HEADACHE. MAY REPEAT X1 IN 2 HOURS IF HEADACHE PERSISTS OR RECURS. DO NOT EXCEED 2 TABS IN 24HRS 9 tablet 2  . clotrimazole-betamethasone (LOTRISONE) cream Apply 1 application topically 3 (three) times daily as needed. For itching (Patient not taking: Reported on 07/02/2016) 45 g 2   No facility-administered medications prior to visit.     ROS Review of Systems  Constitutional: Negative for activity change, appetite change and fatigue.  HENT: Negative for congestion, sinus pressure and sore throat.   Eyes: Negative for visual disturbance.  Respiratory: Negative for cough, chest tightness, shortness of breath and wheezing.   Cardiovascular: Negative for chest pain and palpitations.  Gastrointestinal: Negative for abdominal distention, abdominal pain and constipation.  Endocrine: Negative for polydipsia.  Genitourinary: Negative for dysuria and frequency.  Musculoskeletal: Positive for back pain.  Skin: Negative for rash.  Neurological:  Positive for headaches. Negative for tremors, light-headedness and numbness.  Hematological: Does not bruise/bleed easily.  Psychiatric/Behavioral: Negative for agitation and behavioral problems.    Objective:  BP 136/90 (BP Location: Right Arm, Patient Position: Sitting, Cuff Size: Large)   Pulse (!) 102   Temp 98.2 F (36.8 C) (Oral)   Ht 5' 1"  (1.549 m)   Wt 240 lb 6.4 oz (109 kg)   SpO2 96%   BMI 45.42 kg/m   BP/Weight 07/02/2016 57/0/1779 11/28/298  Systolic BP 923 300 762  Diastolic BP 90 91 82  Wt. (Lbs) 240.4 233 233  BMI 45.42 38.77 44.02      Physical Exam  Constitutional: She is oriented to person, place, and time. She appears well-developed and well-nourished.  In acute distress  Cardiovascular: Normal rate, normal  heart sounds and intact distal pulses.   No murmur heard. Pulmonary/Chest: Effort normal and breath sounds normal. She has no wheezes. She has no rales. She exhibits no tenderness.  Abdominal: Soft. Bowel sounds are normal. She exhibits no distension and no mass. There is no tenderness.  Musculoskeletal: She exhibits tenderness (tenderness to palpation of right side of back; positive straight leg raise).  Neurological: She is alert and oriented to person, place, and time.  Normal muscle tone, normal sensation, normal muscle strength     Lab Results  Component Value Date   HGBA1C 7.6 07/02/2016    Assessment & Plan:   1. Type 2 diabetes mellitus with hyperosmolarity without coma, without long-term current use of insulin (HCC) A1c of 7.6 which has trended up from 7.2. Weight loss, ADA diet. Managed by endocrine Continue current regimen - Glucose (CBG) - HgB A1c  2. Stable asthma, mild persistent Controlled - albuterol (PROVENTIL HFA;VENTOLIN HFA) 108 (90 Base) MCG/ACT inhaler; Inhale 2 puffs into the lungs every 6 (six) hours as needed for wheezing or shortness of breath. Reported on 02/22/2016  Dispense: 1 each; Refill: 3 - albuterol  (PROVENTIL) (2.5 MG/3ML) 0.083% nebulizer solution; Take 3 mLs (2.5 mg total) by nebulization every 6 (six) hours as needed for wheezing or shortness of breath. Reported on 02/22/2016  Dispense: 75 mL; Refill: 3 - Fluticasone-Salmeterol (ADVAIR) 250-50 MCG/DOSE AEPB; Inhale 1 puff into the lungs 2 times daily at 12 noon and 4 pm.  Dispense: 60 each; Refill: 3  3. Neuropathy (HCC) Stable - gabapentin (NEURONTIN) 300 MG capsule; Take 1 capsule (300 mg total) by mouth 3 (three) times daily.  Dispense: 90 capsule; Refill: 3  4. Essential hypertension, benign Controlled - losartan (COZAAR) 100 MG tablet; Take 1 tablet (100 mg total) by mouth daily. For Blood pressure - must have office visit for refills  Dispense: 30 tablet; Refill: 3  5. Gastritis and gastroduodenitis Controlled - pantoprazole (PROTONIX) 40 MG tablet; Take 1 tablet (40 mg total) by mouth daily.  Dispense: 30 tablet; Refill: 3  6. Type 2 diabetes mellitus without complication, with long-term current use of insulin (HCC) - simvastatin (ZOCOR) 10 MG tablet; Take 1 tablet (10 mg total) by mouth daily at 6 PM.  Dispense: 30 tablet; Refill: 3  7. Intractable migraine with aura without status migrainosus Uncontrolled Commenced on Topamax-side effects discussed - SUMAtriptan (IMITREX) 100 MG tablet; TAKE 1TAB AT EARLIEST ONSET OF HEADACHE. MAY REPEAT X1 IN 2 HOURS IF HEADACHE PERSISTS OR RECURS. DO NOT EXCEED 2 TABS IN 24HRS  Dispense: 9 tablet; Refill: 2 - topiramate (TOPAMAX) 100 MG tablet; Take 1 tablet (100 mg total) by mouth 2 (two) times daily.  Dispense: 60 tablet; Refill: 3  8. Acute right-sided low back pain with right-sided sciatica There could also be some underlying muscle spasm Advised to apply heat Demonstrated gentle stretching exercises Will reassess at next visit to determine if an MRI is indicated. - acetaminophen-codeine (TYLENOL #3) 300-30 MG tablet; Take 1 tablet by mouth every 12 (twelve) hours as needed for  moderate pain.  Dispense: 30 tablet; Refill: 0 - methocarbamol (ROBAXIN) 500 MG tablet; Take 1 tablet (500 mg total) by mouth every 8 (eight) hours as needed for muscle spasms.  Dispense: 90 tablet; Refill: 0 - DG Lumbar Spine Complete; Future - ketorolac (TORADOL) injection 60 mg; Inject 2 mLs (60 mg total) into the muscle once.  9. Encounter for immunization - Flu Vaccine QUAD 36+ mos IM   Meds ordered  this encounter  Medications  . albuterol (PROVENTIL HFA;VENTOLIN HFA) 108 (90 Base) MCG/ACT inhaler    Sig: Inhale 2 puffs into the lungs every 6 (six) hours as needed for wheezing or shortness of breath. Reported on 02/22/2016    Dispense:  1 each    Refill:  3  . albuterol (PROVENTIL) (2.5 MG/3ML) 0.083% nebulizer solution    Sig: Take 3 mLs (2.5 mg total) by nebulization every 6 (six) hours as needed for wheezing or shortness of breath. Reported on 02/22/2016    Dispense:  75 mL    Refill:  3  . Fluticasone-Salmeterol (ADVAIR) 250-50 MCG/DOSE AEPB    Sig: Inhale 1 puff into the lungs 2 times daily at 12 noon and 4 pm.    Dispense:  60 each    Refill:  3  . gabapentin (NEURONTIN) 300 MG capsule    Sig: Take 1 capsule (300 mg total) by mouth 3 (three) times daily.    Dispense:  90 capsule    Refill:  3  . losartan (COZAAR) 100 MG tablet    Sig: Take 1 tablet (100 mg total) by mouth daily. For Blood pressure - must have office visit for refills    Dispense:  30 tablet    Refill:  3  . pantoprazole (PROTONIX) 40 MG tablet    Sig: Take 1 tablet (40 mg total) by mouth daily.    Dispense:  30 tablet    Refill:  3  . sertraline (ZOLOFT) 50 MG tablet    Sig: Take 1 tablet (50 mg total) by mouth daily.    Dispense:  30 tablet    Refill:  3  . simvastatin (ZOCOR) 10 MG tablet    Sig: Take 1 tablet (10 mg total) by mouth daily at 6 PM.    Dispense:  30 tablet    Refill:  3  . SUMAtriptan (IMITREX) 100 MG tablet    Sig: TAKE 1TAB AT EARLIEST ONSET OF HEADACHE. MAY REPEAT X1 IN 2 HOURS  IF HEADACHE PERSISTS OR RECURS. DO NOT EXCEED 2 TABS IN 24HRS    Dispense:  9 tablet    Refill:  2  . topiramate (TOPAMAX) 100 MG tablet    Sig: Take 1 tablet (100 mg total) by mouth 2 (two) times daily.    Dispense:  60 tablet    Refill:  3  . acetaminophen-codeine (TYLENOL #3) 300-30 MG tablet    Sig: Take 1 tablet by mouth every 12 (twelve) hours as needed for moderate pain.    Dispense:  30 tablet    Refill:  0  . methocarbamol (ROBAXIN) 500 MG tablet    Sig: Take 1 tablet (500 mg total) by mouth every 8 (eight) hours as needed for muscle spasms.    Dispense:  90 tablet    Refill:  0  . ketorolac (TORADOL) injection 60 mg    Follow-up: Return in about 2 weeks (around 07/16/2016) for follow up of sciatica.   Arnoldo Morale MD

## 2016-07-08 ENCOUNTER — Other Ambulatory Visit (HOSPITAL_COMMUNITY): Payer: Medicare Other

## 2016-07-08 ENCOUNTER — Ambulatory Visit (HOSPITAL_COMMUNITY)
Admission: RE | Admit: 2016-07-08 | Discharge: 2016-07-08 | Disposition: A | Discharge status: Home / Self Care | Payer: Medicare Other | Source: Ambulatory Visit | Attending: Family Medicine | Admitting: Family Medicine

## 2016-07-08 DIAGNOSIS — M5136 Other intervertebral disc degeneration, lumbar region: Secondary | ICD-10-CM | POA: Insufficient documentation

## 2016-07-08 DIAGNOSIS — M5441 Lumbago with sciatica, right side: Secondary | ICD-10-CM | POA: Insufficient documentation

## 2016-07-08 DIAGNOSIS — M545 Low back pain: Secondary | ICD-10-CM | POA: Diagnosis not present

## 2016-07-16 ENCOUNTER — Ambulatory Visit: Payer: Medicare Other | Admitting: Family Medicine

## 2016-07-17 ENCOUNTER — Telehealth: Payer: Self-pay | Admitting: Family Medicine

## 2016-07-17 NOTE — Telephone Encounter (Signed)
Patient called the office to find out the results of her back xrays. Please follow up.

## 2016-07-18 ENCOUNTER — Telehealth: Payer: Self-pay

## 2016-07-18 NOTE — Telephone Encounter (Signed)
My chart message was sent to the patient on 07/08/16 with results.

## 2016-07-18 NOTE — Telephone Encounter (Signed)
Writer called patient back and discussed her xray result which did not show any acute problems.  Patient stated understanding and is going to call her back specialist who she has seen in the past for back problems.  Patient was told to call on the 6th to schedule an appt with Dr. Jarold Song which she states that she will do after she see's the back specialist.

## 2016-07-28 ENCOUNTER — Encounter: Payer: Self-pay | Admitting: Endocrinology

## 2016-07-28 ENCOUNTER — Ambulatory Visit (INDEPENDENT_AMBULATORY_CARE_PROVIDER_SITE_OTHER): Payer: Medicare Other | Admitting: Endocrinology

## 2016-07-28 VITALS — BP 142/94 | HR 95 | Ht 61.0 in | Wt 240.0 lb

## 2016-07-28 DIAGNOSIS — E1100 Type 2 diabetes mellitus with hyperosmolarity without nonketotic hyperglycemic-hyperosmolar coma (NKHHC): Secondary | ICD-10-CM | POA: Diagnosis not present

## 2016-07-28 LAB — POCT GLYCOSYLATED HEMOGLOBIN (HGB A1C): Hemoglobin A1C: 7

## 2016-07-28 MED ORDER — BROMOCRIPTINE MESYLATE 2.5 MG PO TABS: ORAL_TABLET | ORAL | 11 refills | Status: DC

## 2016-07-28 NOTE — Progress Notes (Signed)
Subjective:    Patient ID: [name], female    DOB: [birth year], 45 y.o.   MRN: [id number]  HPI Pt returns for f/u of diabetes mellitus: DM type: Insulin-requiring type 2 Dx'ed: 9758 Complications: painful polyneuropathy Therapy: trulicity and 2 oral meds.   GDM: never DKA: never Severe hypoglycemia: never.  Pancreatitis: never.   Other: she declines weight loss surgery; edema precludes pioglitizone rx; she took insulin from 2012-2017.  Interval history: no cbg record, but states cbg's vary from 60-389.  It is highest at HS, and lowest fasting.   Past Medical History:  Diagnosis Date  . Arthritis   . Asthma   . Chronic back pain   . Chronic chest pain   . Depression   . DM (diabetes mellitus) ([place])    INSULIN DEPENDENT  . GERD (gastroesophageal reflux disease)   . Headache(784.0)   . Hypertension   . Hypokalemia     Past Surgical History:  Procedure Laterality Date  . APPENDECTOMY    . CESAREAN SECTION     x 3  . CHOLECYSTECTOMY N/A 03/02/2014   Procedure: LAPAROSCOPIC CHOLECYSTECTOMY;  Surgeon: Joyice Faster. Cornett, MD;  Location: [facility];  Service: General;  Laterality: N/A;  . HERNIA REPAIR    . ROTATOR CUFF REPAIR    . VESICOVAGINAL FISTULA CLOSURE W/ TAH  2009    Social History   Social History  . Marital status: Married    Spouse name: N/A  . Number of children: N/A  . Years of education: N/A   Occupational History  . Not on file.   Social History Main Topics  . Smoking status: Current Every Day Smoker    Packs/day: 0.25    Years: 22.00    Types: Cigarettes  . Smokeless tobacco: Never Used  . Alcohol use 0.6 - 1.2 oz/week    1 - 2 Glasses of wine per week     Comment: socially  . Drug use: No  . Sexual activity: Yes    Partners: Male   Other Topics Concern  . Not on file   Social History Narrative  . No narrative on file    Current Outpatient Prescriptions on File Prior to Visit  Medication Sig Dispense Refill  .  acetaminophen-codeine (TYLENOL #3) 300-30 MG tablet Take 1 tablet by mouth every 12 (twelve) hours as needed for moderate pain. 30 tablet 0  . albuterol (PROVENTIL HFA;VENTOLIN HFA) 108 (90 Base) MCG/ACT inhaler Inhale 2 puffs into the lungs every 6 (six) hours as needed for wheezing or shortness of breath. Reported on 02/22/2016 1 each 3  . albuterol (PROVENTIL) (2.5 MG/3ML) 0.083% nebulizer solution Take 3 mLs (2.5 mg total) by nebulization every 6 (six) hours as needed for wheezing or shortness of breath. Reported on 02/22/2016 75 mL 3  . aspirin EC 325 MG tablet Take 325 mg by mouth daily.    . Aspirin-Acetaminophen-Caffeine (GOODY HEADACHE PO) Take 1 Package by mouth daily as needed (for leg pain).    . Blood Glucose Monitoring Suppl (ACCU-CHEK AVIVA PLUS) w/Device KIT Use to check blood sugar 3 times per day. DX CODE: E11.9 1 kit 2  . carvedilol (COREG) 6.25 MG tablet TAKE 1 TABLET BY MOUTH 2 TIMES DAILY WITH A MEAL. Must have Office visit for refills 6.25 tablet 3  . dapagliflozin propanediol (FARXIGA) 10 MG TABS tablet Take 10 mg by mouth daily. 30 tablet 11  . Dulaglutide (TRULICITY) 1.5 IT/2.5QD SOPN Inject 1.5 mg into the skin once  a week. 4 pen 11  . Fluticasone-Salmeterol (ADVAIR) 250-50 MCG/DOSE AEPB Inhale 1 puff into the lungs 2 times daily at 12 noon and 4 pm. 60 each 3  . gabapentin (NEURONTIN) 300 MG capsule Take 1 capsule (300 mg total) by mouth 3 (three) times daily. 90 capsule 3  . glucose blood (ACCU-CHEK AVIVA PLUS) test strip Use to check blood sugar 3 times per day.DX Code E11.9 100 each 12  . hydrochlorothiazide (HYDRODIURIL) 25 MG tablet Take 1 tablet (25 mg total) by mouth daily. 30 tablet 4  . Insulin Pen Needle 32G X 4 MM MISC Use to inject insulin 3 times per day. 100 each 2  . Lancet Devices (ACCU-CHEK SOFTCLIX) lancets Use to check blood sugar 3 times per day dx code E11.9 100 each 2  . Lancets (ACCU-CHEK MULTICLIX) lancets Use to check blood sugar 3 times per day. DX  code E11.9 100 each 12  . losartan (COZAAR) 100 MG tablet Take 1 tablet (100 mg total) by mouth daily. For Blood pressure - must have office visit for refills 30 tablet 3  . methocarbamol (ROBAXIN) 500 MG tablet Take 1 tablet (500 mg total) by mouth every 8 (eight) hours as needed for muscle spasms. 90 tablet 0  . naproxen (NAPROSYN) 500 MG tablet Take 1 tablet (500 mg total) by mouth 2 (two) times daily with a meal. 30 tablet 0  . oxyCODONE-acetaminophen (PERCOCET) 5-325 MG tablet Take 1-2 tablets by mouth every 4 (four) hours as needed. 20 tablet 0  . pantoprazole (PROTONIX) 40 MG tablet Take 1 tablet (40 mg total) by mouth daily. 30 tablet 3  . repaglinide (PRANDIN) 2 MG tablet 1 tab with breakfast and lunch, and 2 tabs with dinner. 120 tablet 11  . sertraline (ZOLOFT) 50 MG tablet Take 1 tablet (50 mg total) by mouth daily. 30 tablet 3  . simvastatin (ZOCOR) 10 MG tablet Take 1 tablet (10 mg total) by mouth daily at 6 PM. 30 tablet 3  . SUMAtriptan (IMITREX) 100 MG tablet TAKE 1TAB AT EARLIEST ONSET OF HEADACHE. MAY REPEAT X1 IN 2 HOURS IF HEADACHE PERSISTS OR RECURS. DO NOT EXCEED 2 TABS IN 24HRS 9 tablet 2  . topiramate (TOPAMAX) 100 MG tablet Take 1 tablet (100 mg total) by mouth 2 (two) times daily. 60 tablet 3  . [DISCONTINUED] promethazine (PHENERGAN) 25 MG tablet Take 1 tablet (25 mg total) by mouth every 6 (six) hours as needed for nausea. (Patient not taking: Reported on 12/13/2014) 20 tablet 0   No current facility-administered medications on file prior to visit.     Allergies  Allergen Reactions  . Reglan [Metoclopramide] Other (See Comments)    Panic attack    Family History  Problem Relation Age of Onset  . Heart attack Mother   . Stroke Mother   . Diabetes Mother   . Hypertension Mother   . Arthritis Mother   . Stroke Father   . Hypertension Sister   . Diabetes Sister   . Seizures Brother   . Diabetes Brother     BP (!) 142/94   Pulse 95   Ht 5' 1"  (1.549 m)    Wt 240 lb (108.9 kg)   SpO2 97%   BMI 45.35 kg/m    Review of Systems Denies LOC.     Objective:   Physical Exam VITAL SIGNS:  See vs page GENERAL: no distress Pulses: dorsalis pedis intact bilat.   MSK: no deformity of the feet CV: trace  bilat leg edema Skin:  no ulcer on the feet.  No rash on the feet.  normal color and temp on the feet.  Neuro: sensation is intact to touch on the feet, but decreased from normal.     Lab Results  Component Value Date   HGBA1C 7.0 07/28/2016   Lab Results  Component Value Date   CREATININE 1.02 01/30/2016   BUN 16 01/30/2016   NA 139 01/30/2016   K 3.7 01/30/2016   CL 105 01/30/2016   CO2 24 01/30/2016      Assessment & Plan:  Type 2 DM, with polyneuropathy: she needs increased rx, if it can be done with a regimen that avoids or minimizes hypoglycemia.    Patient is advised the following: Patient Instructions  I have sent a prescription to your pharmacy, to add "bromocriptine."   There is an interaction between this and Imitrex.  Therefore, if you take Imitrex, skip the bromocriptine that day.  However, the dosage of bromocriptine is very low, so it is unlikely to be a problem.   Please come back for a follow-up appointment in 3 months.  check your blood sugar once a day.  vary the time of day when you check, between before the 3 meals, and at bedtime.  also check if you have symptoms of your blood sugar being too high or too low.  please keep a record of the readings and bring it to your next appointment here (or you can bring the meter itself).  You can write it on any piece of paper.  please call us sooner if your blood sugar goes below 70, or if you have a lot of readings over 200.

## 2016-07-28 NOTE — Patient Instructions (Addendum)
I have sent a prescription to your pharmacy, to add "bromocriptine."   There is an interaction between this and Imitrex.  Therefore, if you take Imitrex, skip the bromocriptine that day.  However, the dosage of bromocriptine is very low, so it is unlikely to be a problem.   Please come back for a follow-up appointment in 3 months.  check your blood sugar once a day.  vary the time of day when you check, between before the 3 meals, and at bedtime.  also check if you have symptoms of your blood sugar being too high or too low.  please keep a record of the readings and bring it to your next appointment here (or you can bring the meter itself).  You can write it on any piece of paper.  please call us sooner if your blood sugar goes below 70, or if you have a lot of readings over 200.

## 2016-08-06 ENCOUNTER — Telehealth: Payer: Self-pay | Admitting: Endocrinology

## 2016-08-06 MED ORDER — BROMOCRIPTINE MESYLATE 2.5 MG PO TABS: ORAL_TABLET | ORAL | 2 refills | Status: DC

## 2016-08-06 NOTE — Telephone Encounter (Signed)
Refill for the bromocriptine resubmitted to the [facility] on [address]

## 2016-08-06 NOTE — Telephone Encounter (Signed)
Patient stated ellison called in a diabetic medication for her, pharmacy haven't received it. She didn't know the name of the medication.  [facility] ([address] [place] [phone] (Phone) [phone] (Fax)

## 2016-08-21 ENCOUNTER — Encounter (HOSPITAL_COMMUNITY): Payer: Self-pay | Admitting: Vascular Surgery

## 2016-08-21 ENCOUNTER — Emergency Department (HOSPITAL_COMMUNITY)
Admission: EM | Admit: 2016-08-21 | Discharge: 2016-08-21 | Disposition: A | Discharge status: Home / Self Care | Payer: Medicare Other | Attending: Emergency Medicine | Admitting: Emergency Medicine

## 2016-08-21 ENCOUNTER — Emergency Department (HOSPITAL_COMMUNITY): Payer: Medicare Other

## 2016-08-21 ENCOUNTER — Other Ambulatory Visit: Payer: Self-pay

## 2016-08-21 DIAGNOSIS — R197 Diarrhea, unspecified: Secondary | ICD-10-CM | POA: Diagnosis not present

## 2016-08-21 DIAGNOSIS — R51 Headache: Secondary | ICD-10-CM | POA: Insufficient documentation

## 2016-08-21 DIAGNOSIS — Z79899 Other long term (current) drug therapy: Secondary | ICD-10-CM | POA: Insufficient documentation

## 2016-08-21 DIAGNOSIS — Z5181 Encounter for therapeutic drug level monitoring: Secondary | ICD-10-CM | POA: Insufficient documentation

## 2016-08-21 DIAGNOSIS — Z794 Long term (current) use of insulin: Secondary | ICD-10-CM | POA: Insufficient documentation

## 2016-08-21 DIAGNOSIS — R519 Headache, unspecified: Secondary | ICD-10-CM

## 2016-08-21 DIAGNOSIS — R202 Paresthesia of skin: Secondary | ICD-10-CM | POA: Insufficient documentation

## 2016-08-21 DIAGNOSIS — I10 Essential (primary) hypertension: Secondary | ICD-10-CM | POA: Insufficient documentation

## 2016-08-21 DIAGNOSIS — E119 Type 2 diabetes mellitus without complications: Secondary | ICD-10-CM | POA: Diagnosis not present

## 2016-08-21 DIAGNOSIS — Z7982 Long term (current) use of aspirin: Secondary | ICD-10-CM | POA: Diagnosis not present

## 2016-08-21 DIAGNOSIS — R112 Nausea with vomiting, unspecified: Secondary | ICD-10-CM | POA: Diagnosis not present

## 2016-08-21 DIAGNOSIS — J45909 Unspecified asthma, uncomplicated: Secondary | ICD-10-CM | POA: Diagnosis not present

## 2016-08-21 DIAGNOSIS — R200 Anesthesia of skin: Secondary | ICD-10-CM | POA: Diagnosis not present

## 2016-08-21 DIAGNOSIS — F17210 Nicotine dependence, cigarettes, uncomplicated: Secondary | ICD-10-CM | POA: Diagnosis not present

## 2016-08-21 LAB — COMPREHENSIVE METABOLIC PANEL
ALT: 20 U/L (ref 14–54)
AST: 22 U/L (ref 15–41)
Albumin: 3.8 g/dL (ref 3.5–5.0)
Alkaline Phosphatase: 70 U/L (ref 38–126)
Anion gap: 12 (ref 5–15)
BILIRUBIN TOTAL: 0.5 mg/dL (ref 0.3–1.2)
BUN: 13 mg/dL (ref 6–20)
CHLORIDE: 105 mmol/L (ref 101–111)
CO2: 20 mmol/L — ABNORMAL LOW (ref 22–32)
CREATININE: 1.18 mg/dL — AB (ref 0.44–1.00)
Calcium: 9.4 mg/dL (ref 8.9–10.3)
GFR calc Af Amer: >60 mL/min (ref >60)
GFR, EST NON AFRICAN AMERICAN: 54 mL/min — AB (ref >60)
Glucose, Bld: 149 mg/dL — ABNORMAL HIGH (ref 65–99)
Potassium: 3.4 mmol/L — ABNORMAL LOW (ref 3.5–5.1)
Sodium: 137 mmol/L (ref 135–145)
Total Protein: 7.1 g/dL (ref 6.5–8.1)

## 2016-08-21 LAB — I-STAT CHEM 8, ED
BUN: 14 mg/dL (ref 6–20)
CHLORIDE: 104 mmol/L (ref 101–111)
Calcium, Ion: 1.21 mmol/L (ref 1.15–1.40)
Creatinine, Ser: 1.1 mg/dL — ABNORMAL HIGH (ref 0.44–1.00)
GLUCOSE: 151 mg/dL — AB (ref 65–99)
HCT: 43 % (ref 36.0–46.0)
Hemoglobin: 14.6 g/dL (ref 12.0–15.0)
POTASSIUM: 3.6 mmol/L (ref 3.5–5.1)
Sodium: 140 mmol/L (ref 135–145)
TCO2: 21 mmol/L (ref 0–100)

## 2016-08-21 LAB — APTT: aPTT: 28 seconds (ref 24–36)

## 2016-08-21 LAB — CBC
HCT: 40.8 % (ref 36.0–46.0)
Hemoglobin: 13.8 g/dL (ref 12.0–15.0)
MCH: 29.2 pg (ref 26.0–34.0)
MCHC: 33.8 g/dL (ref 30.0–36.0)
MCV: 86.4 fL (ref 78.0–100.0)
PLATELETS: 324 10*3/uL (ref 150–400)
RBC: 4.72 MIL/uL (ref 3.87–5.11)
RDW: 13.9 % (ref 11.5–15.5)
WBC: 7.8 10*3/uL (ref 4.0–10.5)

## 2016-08-21 LAB — URINALYSIS, ROUTINE W REFLEX MICROSCOPIC
Bilirubin Urine: NEGATIVE
GLUCOSE, UA: NEGATIVE mg/dL
Hgb urine dipstick: NEGATIVE
KETONES UR: NEGATIVE mg/dL
LEUKOCYTES UA: NEGATIVE
Nitrite: NEGATIVE
PH: 6 (ref 5.0–8.0)
Protein, ur: NEGATIVE mg/dL
Specific Gravity, Urine: 1.024 (ref 1.005–1.030)

## 2016-08-21 LAB — DIFFERENTIAL
Basophils Absolute: 0 10*3/uL (ref 0.0–0.1)
Basophils Relative: 0 %
Eosinophils Absolute: 0.1 10*3/uL (ref 0.0–0.7)
Eosinophils Relative: 1 %
LYMPHS PCT: 50 %
Lymphs Abs: 3.9 10*3/uL (ref 0.7–4.0)
Monocytes Absolute: 0.6 10*3/uL (ref 0.1–1.0)
Monocytes Relative: 8 %
NEUTROS ABS: 3.2 10*3/uL (ref 1.7–7.7)
NEUTROS PCT: 41 %

## 2016-08-21 LAB — I-STAT TROPONIN, ED: TROPONIN I, POC: 0 ng/mL (ref 0.00–0.08)

## 2016-08-21 LAB — LIPASE, BLOOD: Lipase: 27 U/L (ref 11–51)

## 2016-08-21 LAB — PROTIME-INR
INR: 1.11
PROTHROMBIN TIME: 14.3 s (ref 11.4–15.2)

## 2016-08-21 MED ORDER — PROCHLORPERAZINE MALEATE 10 MG PO TABS: 10.0000 mg | ORAL_TABLET | Freq: Two times a day (BID) | ORAL | 0 refills | Status: DC | PRN

## 2016-08-21 MED ORDER — PROCHLORPERAZINE EDISYLATE 5 MG/ML IJ SOLN
10.0000 mg | Freq: Once | INTRAMUSCULAR | Status: AC
  Administered 2016-08-21: 10 mg via INTRAVENOUS
  Filled 2016-08-21: qty 2

## 2016-08-21 MED ORDER — SODIUM CHLORIDE 0.9 % IV BOLUS (SEPSIS)
1000.0000 mL | Freq: Once | INTRAVENOUS | Status: AC
  Administered 2016-08-21: 1000 mL via INTRAVENOUS

## 2016-08-21 MED ORDER — DIPHENHYDRAMINE HCL 50 MG/ML IJ SOLN
25.0000 mg | Freq: Once | INTRAMUSCULAR | Status: AC
  Administered 2016-08-21: 25 mg via INTRAVENOUS
  Filled 2016-08-21: qty 1

## 2016-08-21 NOTE — ED Notes (Signed)
Placed patient into a gown and on the monitor waiting on provider 

## 2016-08-21 NOTE — ED Provider Notes (Signed)
[facility] DEPT Provider Note   CSN: [id number] Arrival date & time: 08/21/16  0908     History   Chief Complaint Chief Complaint  Patient presents with  . Headache  . Emesis  . Diarrhea    [name] is a 45 y.o. female.  Patient with history of Type 2 DM, migraine HA, appendectomy and hysterectomy -- presents with three complaints starting 7 days ago. Patient's initial complaint is headache described as a tight throbbing sensation on the left side of her head. Pain is similar to previous migraines however patient has had prolonged vomiting, photophobia, and paresthesias with this episode which is atypical. No neck pain, vision change. Patient has taken Imitrex which has not helped.  Patient also has left facial paresthesias as noted above with associated left hand paresthesias and left upper extremity weakness. This has been ongoing as well. No history of the symptoms. Patient denies signs of stroke including: facial droop, slurred speech, aphasia, weakness/numbness in extremities, imbalance/trouble walking (other than lightheaded). No h/o atrial fib or other arrhythmias.   Patient also has had abdominal cramping with associated nausea, vomiting, and watery, non-bloody diarrhea. No other sick contacts. Cramping is generalized in nature. No urinary symptoms. No skin rashes.        Past Medical History:  Diagnosis Date  . Arthritis   . Asthma   . Chronic back pain   . Chronic chest pain   . Depression   . DM (diabetes mellitus) ([place])    INSULIN DEPENDENT  . GERD (gastroesophageal reflux disease)   . Headache(784.0)   . Hypertension   . Hypokalemia     Patient Active Problem List   Diagnosis Date Noted  . Gastritis and gastroduodenitis 02/22/2016  . Hyperlipidemia 02/22/2016  . Depression 02/22/2016  . Migraine 01/30/2015  . Headache 01/30/2015  . URI (upper respiratory infection) 06/04/2014  . Atypical chest pain 02/26/2014  . DM type  2 (diabetes mellitus, type 2) ([place]) 02/26/2014  . Stable asthma 02/26/2014  . Chest pain 11/10/2013  . Hypertensive urgency 11/10/2013  . Back pain 09/05/2013  . Essential hypertension, benign 09/05/2013  . Acute bronchitis 05/01/2013  . Asthma exacerbation 04/27/2013  . Uncontrolled diabetes mellitus ([place]) 04/27/2013  . Obesity 04/27/2013  . Allergic rhinitis, seasonal 08/11/2012  . Chronic cough 08/11/2012  . Sleep apnea, obstructive 12/04/2011    Past Surgical History:  Procedure Laterality Date  . APPENDECTOMY    . CESAREAN SECTION     x 3  . CHOLECYSTECTOMY N/A 03/02/2014   Procedure: LAPAROSCOPIC CHOLECYSTECTOMY;  Surgeon: Joyice Faster. Cornett, MD;  Location: [facility];  Service: General;  Laterality: N/A;  . HERNIA REPAIR    . ROTATOR CUFF REPAIR    . VESICOVAGINAL FISTULA CLOSURE W/ TAH  2009    OB History    No data available       Home Medications    Prior to Admission medications   Medication Sig Start Date End Date Taking? Authorizing Provider  acetaminophen-codeine (TYLENOL #3) 300-30 MG tablet Take 1 tablet by mouth every 12 (twelve) hours as needed for moderate pain. 07/02/16   Arnoldo Morale, MD  albuterol (PROVENTIL HFA;VENTOLIN HFA) 108 (90 Base) MCG/ACT inhaler Inhale 2 puffs into the lungs every 6 (six) hours as needed for wheezing or shortness of breath. Reported on 02/22/2016 07/02/16   Arnoldo Morale, MD  albuterol (PROVENTIL) (2.5 MG/3ML) 0.083% nebulizer solution Take 3 mLs (2.5 mg total) by nebulization every 6 (six) hours as needed for  wheezing or shortness of breath. Reported on 02/22/2016 07/02/16   Arnoldo Morale, MD  aspirin EC 325 MG tablet Take 325 mg by mouth daily.    Historical Provider, MD  Aspirin-Acetaminophen-Caffeine (GOODY HEADACHE PO) Take 1 Package by mouth daily as needed (for leg pain).    Historical Provider, MD  Blood Glucose Monitoring Suppl (ACCU-CHEK AVIVA PLUS) w/Device KIT Use to check blood sugar 3 times per day. DX CODE: E11.9 05/21/16    Renato Shin, MD  bromocriptine (PARLODEL) 2.5 MG tablet 1/4 tab daily 08/06/16   Renato Shin, MD  carvedilol (COREG) 6.25 MG tablet TAKE 1 TABLET BY MOUTH 2 TIMES DAILY WITH A MEAL. Must have Office visit for refills 02/22/16   Arnoldo Morale, MD  dapagliflozin propanediol (FARXIGA) 10 MG TABS tablet Take 10 mg by mouth daily. 01/25/16   Renato Shin, MD  Dulaglutide (TRULICITY) 1.5 WR/6.0AV SOPN Inject 1.5 mg into the skin once a week. 05/29/16   Renato Shin, MD  Fluticasone-Salmeterol (ADVAIR) 250-50 MCG/DOSE AEPB Inhale 1 puff into the lungs 2 times daily at 12 noon and 4 pm. 07/02/16   Arnoldo Morale, MD  gabapentin (NEURONTIN) 300 MG capsule Take 1 capsule (300 mg total) by mouth 3 (three) times daily. 07/02/16   Arnoldo Morale, MD  glucose blood (ACCU-CHEK AVIVA PLUS) test strip Use to check blood sugar 3 times per day.DX Code E11.9 08/24/15   Renato Shin, MD  hydrochlorothiazide (HYDRODIURIL) 25 MG tablet Take 1 tablet (25 mg total) by mouth daily. 01/28/16   Tresa Garter, MD  Insulin Pen Needle 32G X 4 MM MISC Use to inject insulin 3 times per day. 10/29/15   Renato Shin, MD  Lancet Devices [facility]) lancets Use to check blood sugar 3 times per day dx code E11.9 09/18/15   Elayne Snare, MD  Lancets (ACCU-CHEK MULTICLIX) lancets Use to check blood sugar 3 times per day. DX code E11.9 08/24/15   Renato Shin, MD  losartan (COZAAR) 100 MG tablet Take 1 tablet (100 mg total) by mouth daily. For Blood pressure - must have office visit for refills 07/02/16   Arnoldo Morale, MD  methocarbamol (ROBAXIN) 500 MG tablet Take 1 tablet (500 mg total) by mouth every 8 (eight) hours as needed for muscle spasms. 07/02/16   Arnoldo Morale, MD  naproxen (NAPROSYN) 500 MG tablet Take 1 tablet (500 mg total) by mouth 2 (two) times daily with a meal. 06/23/16   Margarita Mail, PA-C  oxyCODONE-acetaminophen (PERCOCET) 5-325 MG tablet Take 1-2 tablets by mouth every 4 (four) hours as needed. 06/23/16   Margarita Mail,  PA-C  pantoprazole (PROTONIX) 40 MG tablet Take 1 tablet (40 mg total) by mouth daily. 07/02/16   Arnoldo Morale, MD  repaglinide (PRANDIN) 2 MG tablet 1 tab with breakfast and lunch, and 2 tabs with dinner. 05/27/16   Renato Shin, MD  sertraline (ZOLOFT) 50 MG tablet Take 1 tablet (50 mg total) by mouth daily. 07/02/16   Arnoldo Morale, MD  simvastatin (ZOCOR) 10 MG tablet Take 1 tablet (10 mg total) by mouth daily at 6 PM. 07/02/16   Arnoldo Morale, MD  SUMAtriptan (IMITREX) 100 MG tablet TAKE 1TAB AT EARLIEST ONSET OF HEADACHE. MAY REPEAT X1 IN 2 HOURS IF HEADACHE PERSISTS OR RECURS. DO NOT EXCEED 2 TABS IN 24HRS 07/02/16   Arnoldo Morale, MD  topiramate (TOPAMAX) 100 MG tablet Take 1 tablet (100 mg total) by mouth 2 (two) times daily. 07/02/16   Arnoldo Morale, MD  Family History Family History  Problem Relation Age of Onset  . Heart attack Mother   . Stroke Mother   . Diabetes Mother   . Hypertension Mother   . Arthritis Mother   . Stroke Father   . Hypertension Sister   . Diabetes Sister   . Seizures Brother   . Diabetes Brother     Social History Social History  Substance Use Topics  . Smoking status: Current Every Day Smoker    Packs/day: 0.25    Years: 22.00    Types: Cigarettes  . Smokeless tobacco: Never Used  . Alcohol use 0.6 - 1.2 oz/week    1 - 2 Glasses of wine per week     Comment: socially     Allergies   Reglan [metoclopramide]   Review of Systems Review of Systems  Constitutional: Negative for fever.  HENT: Negative for congestion, dental problem, rhinorrhea, sinus pressure and sore throat.   Eyes: Positive for photophobia. Negative for discharge, redness and visual disturbance.  Respiratory: Negative for cough and shortness of breath.   Cardiovascular: Negative for chest pain.  Gastrointestinal: Positive for abdominal pain, diarrhea, nausea and vomiting.  Genitourinary: Negative for dysuria.  Musculoskeletal: Negative for gait problem, myalgias, neck  pain and neck stiffness.  Skin: Negative for rash.  Neurological: Positive for weakness, light-headedness (with standing), numbness (facial and L hand/finger paresthesias) and headaches. Negative for syncope and speech difficulty.  Psychiatric/Behavioral: Negative for confusion.     Physical Exam Updated Vital Signs BP (!) 149/106 (BP Location: Right Arm)   Pulse 108   Temp 98.4 F (36.9 C) (Oral)   Resp 17   Ht 5' 1"  (1.549 m)   Wt 108.9 kg   SpO2 99%   BMI 45.35 kg/m   Physical Exam  Constitutional: She is oriented to person, place, and time. She appears well-developed and well-nourished.  HENT:  Head: Normocephalic and atraumatic.  Right Ear: Tympanic membrane, external ear and ear canal normal.  Left Ear: Tympanic membrane, external ear and ear canal normal.  Nose: Nose normal.  Mouth/Throat: Uvula is midline, oropharynx is clear and moist and mucous membranes are normal.  Eyes: Conjunctivae, EOM and lids are normal. Pupils are equal, round, and reactive to light. Right eye exhibits no discharge. Left eye exhibits no discharge. Right eye exhibits no nystagmus. Left eye exhibits no nystagmus.  Neck: Normal range of motion. Neck supple. Carotid bruit is not present.  Cardiovascular: Normal rate, regular rhythm and normal heart sounds.   No murmur heard. Pulmonary/Chest: Effort normal and breath sounds normal. No respiratory distress. She has no wheezes. She has no rales.  Abdominal: Soft. She exhibits no mass. There is tenderness (mild, generalized). There is no guarding.  Musculoskeletal: She exhibits no edema.       Cervical back: She exhibits normal range of motion, no tenderness and no bony tenderness.  Neurological: She is alert and oriented to person, place, and time. She has normal strength and normal reflexes. No cranial nerve deficit or sensory deficit. She displays a negative Romberg sign. Coordination and gait normal. GCS eye subscore is 4. GCS verbal subscore is 5.  GCS motor subscore is 6.  Trace weakness L upper extremity with grips, flexion/extension  Skin: Skin is warm and dry.  Psychiatric: She has a normal mood and affect.  Nursing note and vitals reviewed.    ED Treatments / Results  Labs (all labs ordered are listed, but only abnormal results are displayed) Labs Reviewed  COMPREHENSIVE METABOLIC PANEL - Abnormal; Notable for the following:       Result Value   Potassium 3.4 (*)    CO2 20 (*)    Glucose, Bld 149 (*)    Creatinine, Ser 1.18 (*)    GFR calc non Af Amer 54 (*)    All other components within normal limits  URINALYSIS, ROUTINE W REFLEX MICROSCOPIC (NOT AT [facility]) - Abnormal; Notable for the following:    APPearance CLOUDY (*)    All other components within normal limits  I-STAT CHEM 8, ED - Abnormal; Notable for the following:    Creatinine, Ser 1.10 (*)    Glucose, Bld 151 (*)    All other components within normal limits  PROTIME-INR  APTT  CBC  DIFFERENTIAL  LIPASE, BLOOD  I-STAT TROPOININ, ED   ED ECG REPORT   Date: 08/21/2016  Rate: 88  Rhythm: normal sinus rhythm  QRS Axis: left  Intervals: normal  ST/T Wave abnormalities: normal  Conduction Disutrbances:none  Narrative Interpretation:   Old EKG Reviewed: changes noted - new left axis otherwise unchanged  I have personally reviewed the EKG tracing and agree with the computerized printout as noted.  Radiology Ct Head Wo Contrast  Result Date: 08/21/2016 CLINICAL DATA:  Left-sided facial and extremity numbness and weakness EXAM: CT HEAD WITHOUT CONTRAST TECHNIQUE: Contiguous axial images were obtained from the base of the skull through the vertex without intravenous contrast. COMPARISON:  Jan 30, 2015 FINDINGS: Brain: The ventricles are normal in size and configuration. There is no intracranial mass, hemorrhage, extra-axial fluid collection, or midline shift. Gray-white compartments appear normal. No acute infarct evident. Vascular: No hyperdense vessel. No  appreciable vascular calcification. Skull: Bony calvarium appears intact. Sinuses/Orbits: There is a small retention cyst in the posterior left maxillary antrum. Other paranasal sinuses are clear. Orbits appear symmetric bilaterally. Other: Mastoid air cells are clear. IMPRESSION: Small retention cyst in the posterior left maxillary antrum. No intracranial mass, hemorrhage, or extra-axial fluid collection. Gray-white compartments appear normal. Electronically Signed   By: Lowella Grip III M.D.   On: 08/21/2016 11:23    Procedures Procedures (including critical care time)  Medications Ordered in ED Medications  prochlorperazine (COMPAZINE) injection 10 mg (10 mg Intravenous Given 08/21/16 1051)  diphenhydrAMINE (BENADRYL) injection 25 mg (25 mg Intravenous Given 08/21/16 1052)  sodium chloride 0.9 % bolus 1,000 mL (0 mLs Intravenous Stopped 08/21/16 1240)     Initial Impression / Assessment and Plan / ED Course  I have reviewed the triage vital signs and the nursing notes.  Pertinent labs & imaging results that were available during my care of the patient were reviewed by me and considered in my medical decision making (see chart for details).  Clinical Course    Patient seen and examined. Work-up initiated. Medications ordered. Will treat HA, eval for abd pain. Feel CT indicated given trace upper extremity weakness.   Vital signs reviewed and are as follows: BP 131/85 (BP Location: Right Arm)   Pulse 88   Temp 98.4 F (36.9 C) (Oral)   Resp 20   Ht 5' 1"  (1.549 m)   Wt 108.9 kg   SpO2 100%   BMI 45.35 kg/m   12:44 PM workup largely negative as above. CT imaging is negative. Patient has had improvement in all of her symptoms with IV fluids and Compazine/Benadryl. Headache resolved. Paresthesias resolved. No further vomiting. One episode of diarrhea while in the emergency department. On repeat exam, upper extremity strength equal  bilaterally.  Patient states that she is ready  to go home and she feels comfortable with discharge. Encouraged PCP follow-up for recheck in the next 3 days.  The patient was urged to return to the Emergency Department immediately with worsening of current symptoms, worsening abdominal pain, persistent vomiting, blood noted in stools, fever, or any other concerns. The patient verbalized understanding.   Patient counseled to return if they have weakness in their arms or legs, slurred speech, trouble walking or talking, confusion, trouble with their balance, or if they have any other concerns. Patient verbalizes understanding and agrees with plan.   D/c to home with oral compazine for nausea.    Final Clinical Impressions(s) / ED Diagnoses   Final diagnoses:  Acute nonintractable headache, unspecified headache type  Nausea vomiting and diarrhea  Paresthesia   HA: Pt with L UE weakness, paresthesias -- resolved with treatment of HA. CT imaging is reassuring. Patient without high-risk features of headache including: sudden onset/thunderclap HA, no similar headache in past, altered mental status, accompanying seizure, headache with exertion, age > 20, history of immunocompromise, neck or shoulder pain, fever, use of anticoagulation, family history of spontaneous SAH, concomitant drug use, toxic exposure.   Patient has a normal complete neurological exam, normal vital signs, normal level of consciousness, no signs of meningismus, is well-appearing/non-toxic appearing, no signs of trauma.   No dangerous or life-threatening conditions suspected or identified by history, physical exam, and by work-up. No indications for hospitalization identified.   N/V/D, abd cramping: Labs reassuring, sx controlled in ED, no focal pain. Do not feel advanced imaging indicated at this time. Vitals are stable, no fever.  No signs of dehydration, tolerating PO's. Lungs are clear. No focal abdominal pain. Low concern for appendicitis, cholecystitis, pancreatitis,  ruptured viscus, UTI, kidney stone, aortic dissection, aortic aneurysm or other emergent abdominal etiology. Supportive therapy indicated with return if symptoms worsen.   Paresthesia: Doubt CVA. No afib or carotid bruit. Paresthesia/weakness resolved with treatment of migraine. Pt ambulatory without trouble with no ataxia. Likely related to migraine HA.    New Prescriptions New Prescriptions   PROCHLORPERAZINE (COMPAZINE) 10 MG TABLET    Take 1 tablet (10 mg total) by mouth 2 (two) times daily as needed for nausea or vomiting.     Carlisle Cater, PA-C 08/21/16 [place], MD 08/22/16 [phone]

## 2016-08-21 NOTE — ED Notes (Signed)
Pt states she has not felt well since day before thanksgiving with h/a n/v/d, has taken her h/a meds but not felt any better, denies one side weaker than the other, but left side of head hurts worse than rt  just reports all over weakness, pt is diabetic,

## 2016-08-21 NOTE — ED Triage Notes (Signed)
Pt reports to the ED for eval of abd pain and N/V/D since thanksgiving. No one else is ill. She also reports she has had a HA, left face numbness, and tingling since last Thursday as well. She has hx of migraines. Pt reports the HA is worse with light and sound.

## 2016-08-21 NOTE — ED Notes (Signed)
Pt up to BR ua to lab, back up to [facility] for diarrhea

## 2016-08-21 NOTE — Discharge Instructions (Signed)
Please read and follow all provided instructions.  Your diagnoses today include:  1. Acute nonintractable headache, unspecified headache type   2. Nausea vomiting and diarrhea   3. Paresthesia    Tests performed today include:  Blood counts and electrolytes  CT head - no stroke or other problems  Vital signs. See below for your results today.   Medications prescribed:   Compazine - medication for vomiting  Take any prescribed medications only as directed.  Home care instructions:  Follow any educational materials contained in this packet.  Follow-up instructions: Please follow-up with your primary care provider in the next 3 days for further evaluation of your symptoms.   Return instructions:   Please return to the Emergency Department if you experience worsening symptoms.   Patient counseled to return if they have weakness in their arms or legs, slurred speech, trouble walking or talking, confusion, trouble with their balance, or if they have any other concerns. Patient verbalizes understanding and agrees with plan.   Return with worsening vomiting, abdominal pain, or fever.   Please return if you have any other emergent concerns.  Additional Information:  Your vital signs today were: BP 123/80    Pulse 79    Temp 98.4 F (36.9 C) (Oral)    Resp 15    Ht 5\' 1"  (1.549 m)    Wt 108.9 kg    SpO2 100%    BMI 45.35 kg/m  If your blood pressure (BP) was elevated above 135/85 this visit, please have this repeated by your doctor within one month. --------------

## 2016-08-26 ENCOUNTER — Telehealth: Payer: Self-pay | Admitting: Family Medicine

## 2016-08-26 NOTE — Telephone Encounter (Signed)
Patient's daughter called requesting a referral for patient to get seen for her shoulder

## 2016-09-03 ENCOUNTER — Telehealth: Payer: Self-pay | Admitting: Family Medicine

## 2016-09-03 NOTE — Telephone Encounter (Signed)
Patient called the office to speak with PCP regarding her upcoming appt. Pt will be seeing specialist on 12/20 and wants to know if she still needs to see PCP. If so, please call her today because she needs to schedule her transportation for Monday 12/18. Please follow up.  Thank you

## 2016-09-04 NOTE — Telephone Encounter (Signed)
Writer called patient back and she stated that "someone from the [facility] called and told her to keep her appt per Dr. Jarold Song.

## 2016-09-08 ENCOUNTER — Encounter: Payer: Self-pay | Admitting: Family Medicine

## 2016-09-08 ENCOUNTER — Ambulatory Visit: Payer: Medicare Other | Attending: Family Medicine | Admitting: Family Medicine

## 2016-09-08 VITALS — BP 147/92 | HR 81 | Temp 98.9°F | Ht 61.0 in | Wt 238.4 lb

## 2016-09-08 DIAGNOSIS — K2990 Gastroduodenitis, unspecified, without bleeding: Secondary | ICD-10-CM

## 2016-09-08 DIAGNOSIS — Z9049 Acquired absence of other specified parts of digestive tract: Secondary | ICD-10-CM | POA: Insufficient documentation

## 2016-09-08 DIAGNOSIS — H538 Other visual disturbances: Secondary | ICD-10-CM | POA: Insufficient documentation

## 2016-09-08 DIAGNOSIS — I10 Essential (primary) hypertension: Secondary | ICD-10-CM | POA: Diagnosis not present

## 2016-09-08 DIAGNOSIS — M5441 Lumbago with sciatica, right side: Secondary | ICD-10-CM | POA: Diagnosis not present

## 2016-09-08 DIAGNOSIS — M25512 Pain in left shoulder: Secondary | ICD-10-CM | POA: Insufficient documentation

## 2016-09-08 DIAGNOSIS — M25511 Pain in right shoulder: Secondary | ICD-10-CM

## 2016-09-08 DIAGNOSIS — H1011 Acute atopic conjunctivitis, right eye: Secondary | ICD-10-CM | POA: Diagnosis not present

## 2016-09-08 DIAGNOSIS — G43119 Migraine with aura, intractable, without status migrainosus: Secondary | ICD-10-CM | POA: Insufficient documentation

## 2016-09-08 DIAGNOSIS — J4530 Mild persistent asthma, uncomplicated: Secondary | ICD-10-CM | POA: Insufficient documentation

## 2016-09-08 DIAGNOSIS — Z9889 Other specified postprocedural states: Secondary | ICD-10-CM | POA: Diagnosis not present

## 2016-09-08 DIAGNOSIS — Z794 Long term (current) use of insulin: Secondary | ICD-10-CM | POA: Insufficient documentation

## 2016-09-08 DIAGNOSIS — F329 Major depressive disorder, single episode, unspecified: Secondary | ICD-10-CM | POA: Insufficient documentation

## 2016-09-08 DIAGNOSIS — M1990 Unspecified osteoarthritis, unspecified site: Secondary | ICD-10-CM | POA: Diagnosis not present

## 2016-09-08 DIAGNOSIS — K2970 Gastritis, unspecified, without bleeding: Secondary | ICD-10-CM

## 2016-09-08 DIAGNOSIS — E876 Hypokalemia: Secondary | ICD-10-CM | POA: Diagnosis not present

## 2016-09-08 DIAGNOSIS — Z888 Allergy status to other drugs, medicaments and biological substances status: Secondary | ICD-10-CM | POA: Insufficient documentation

## 2016-09-08 DIAGNOSIS — K219 Gastro-esophageal reflux disease without esophagitis: Secondary | ICD-10-CM | POA: Diagnosis not present

## 2016-09-08 DIAGNOSIS — Z7982 Long term (current) use of aspirin: Secondary | ICD-10-CM | POA: Insufficient documentation

## 2016-09-08 DIAGNOSIS — G8929 Other chronic pain: Secondary | ICD-10-CM | POA: Diagnosis not present

## 2016-09-08 DIAGNOSIS — E1100 Type 2 diabetes mellitus with hyperosmolarity without nonketotic hyperglycemic-hyperosmolar coma (NKHHC): Secondary | ICD-10-CM | POA: Diagnosis not present

## 2016-09-08 DIAGNOSIS — G629 Polyneuropathy, unspecified: Secondary | ICD-10-CM

## 2016-09-08 LAB — GLUCOSE, POCT (MANUAL RESULT ENTRY): POC Glucose: 209 mg/dl — AB (ref 70–99)

## 2016-09-08 MED ORDER — TOPIRAMATE 100 MG PO TABS: 100.0000 mg | ORAL_TABLET | Freq: Two times a day (BID) | ORAL | 3 refills | Status: DC

## 2016-09-08 MED ORDER — ACETAMINOPHEN-CODEINE #3 300-30 MG PO TABS: 1.0000 | ORAL_TABLET | Freq: Two times a day (BID) | ORAL | 0 refills | Status: DC | PRN

## 2016-09-08 MED ORDER — ALBUTEROL SULFATE (2.5 MG/3ML) 0.083% IN NEBU: 2.5000 mg | INHALATION_SOLUTION | Freq: Four times a day (QID) | RESPIRATORY_TRACT | 3 refills | Status: DC | PRN

## 2016-09-08 MED ORDER — SUMATRIPTAN SUCCINATE 100 MG PO TABS: ORAL_TABLET | ORAL | 2 refills | Status: DC

## 2016-09-08 MED ORDER — FLUTICASONE-SALMETEROL 250-50 MCG/DOSE IN AEPB: 1.0000 | INHALATION_SPRAY | Freq: Two times a day (BID) | RESPIRATORY_TRACT | 3 refills | Status: DC

## 2016-09-08 MED ORDER — PANTOPRAZOLE SODIUM 40 MG PO TBEC: 40.0000 mg | DELAYED_RELEASE_TABLET | Freq: Every day | ORAL | 3 refills | Status: DC

## 2016-09-08 MED ORDER — SERTRALINE HCL 50 MG PO TABS: 50.0000 mg | ORAL_TABLET | Freq: Every day | ORAL | 3 refills | Status: DC

## 2016-09-08 MED ORDER — LOSARTAN POTASSIUM 100 MG PO TABS: 100.0000 mg | ORAL_TABLET | Freq: Every day | ORAL | 3 refills | Status: DC

## 2016-09-08 MED ORDER — NAPROXEN 500 MG PO TABS: 500.0000 mg | ORAL_TABLET | Freq: Two times a day (BID) | ORAL | 1 refills | Status: DC

## 2016-09-08 MED ORDER — HYDROCHLOROTHIAZIDE 25 MG PO TABS: 25.0000 mg | ORAL_TABLET | Freq: Every day | ORAL | 4 refills | Status: DC

## 2016-09-08 MED ORDER — SIMVASTATIN 10 MG PO TABS: 10.0000 mg | ORAL_TABLET | Freq: Every day | ORAL | 3 refills | Status: DC

## 2016-09-08 MED ORDER — OLOPATADINE HCL 0.1 % OP SOLN: 1.0000 [drp] | Freq: Two times a day (BID) | OPHTHALMIC | 1 refills | Status: DC

## 2016-09-08 MED ORDER — GABAPENTIN 300 MG PO CAPS: 300.0000 mg | ORAL_CAPSULE | Freq: Three times a day (TID) | ORAL | 3 refills | Status: DC

## 2016-09-08 MED ORDER — ALBUTEROL SULFATE HFA 108 (90 BASE) MCG/ACT IN AERS: 2.0000 | INHALATION_SPRAY | Freq: Four times a day (QID) | RESPIRATORY_TRACT | 3 refills | Status: DC | PRN

## 2016-09-08 MED ORDER — METHOCARBAMOL 500 MG PO TABS: 500.0000 mg | ORAL_TABLET | Freq: Three times a day (TID) | ORAL | 1 refills | Status: DC | PRN

## 2016-09-08 MED ORDER — CARVEDILOL 6.25 MG PO TABS: ORAL_TABLET | ORAL | 3 refills | Status: DC

## 2016-09-08 NOTE — Progress Notes (Signed)
Subjective:  Patient ID: [name], female    DOB: 1970-10-30  Age: 45 y.o. MRN: [id number]  CC: Diabetes; Hypertension; Asthma; Migraine; Arthritis; Back Pain (lower); Shoulder Pain (right); and eye issues (referral to opth)   [name] presents for is a 45 year old female with a history of type 2 diabetes mellitus (A1c 7.0 from today), hypertension, asthma, depression who comes into the clinic for follow-up visit.   She has been compliant with her diabetic regimen and is closely followed by [facility] and denies any hypoglycemia or numbness in extremities. Asthma has also been controlled with no recent exacerbation, shortness of breath or chest pain.  Remains on Imitrex for breakthrough migraines and Topamax had been commenced at her last visit due to poor control.  Complains of right shoulder pain for the last few weeks which prevents her from lifting her right hand above her head; she previously had left shoulder pain with MRI findings in 10/2014 revealing small full thickness insertional tear of the supraspinatus and has been compensating with her right shoulder until recently. Right shoulder pain is relieved by maintaining right arm in adduction and flexed position.She continues to have low back pain which she complained of at her Last Office Visit and Is Taking Robaxin with no relief. Scheduled to See Orthopedics (Dr Percell Miller) in 2 Days.  Complains of tearing and itching of the right eye and occasional blurry vision.  Past Medical History:  Diagnosis Date  . Arthritis   . Asthma   . Chronic back pain   . Chronic chest pain   . Depression   . DM (diabetes mellitus) ([facility])    INSULIN DEPENDENT  . GERD (gastroesophageal reflux disease)   . Headache(784.0)   . Hypertension   . Hypokalemia     Past Surgical History:  Procedure Laterality Date  . APPENDECTOMY    . CESAREAN SECTION     x 3  . CHOLECYSTECTOMY N/A 03/02/2014   Procedure: LAPAROSCOPIC CHOLECYSTECTOMY;  Surgeon: Joyice Faster. Cornett, MD;  Location: [place];  Service: General;  Laterality: N/A;  . HERNIA REPAIR    . ROTATOR CUFF REPAIR    . VESICOVAGINAL FISTULA CLOSURE W/ TAH  2009    Allergies  Allergen Reactions  . Reglan [Metoclopramide] Other (See Comments)    Panic attack    Outpatient Medications Prior to Visit  Medication Sig Dispense Refill  . aspirin EC 325 MG tablet Take 325 mg by mouth daily.    . Blood Glucose Monitoring Suppl (ACCU-CHEK AVIVA PLUS) w/Device KIT Use to check blood sugar 3 times per day. DX CODE: E11.9 1 kit 2  . bromocriptine (PARLODEL) 2.5 MG tablet 1/4 tab daily 20 tablet 2  . dapagliflozin propanediol (FARXIGA) 10 MG TABS tablet Take 10 mg by mouth daily. 30 tablet 11  . Dulaglutide (TRULICITY) 1.5 IR/4.4RX SOPN Inject 1.5 mg into the skin once a week. 4 pen 11  . glucose blood (ACCU-CHEK AVIVA PLUS) test strip Use to check blood sugar 3 times per day.DX Code E11.9 100 each 12  . Lancet Devices (ACCU-CHEK SOFTCLIX) lancets Use to check blood sugar 3 times per day dx code E11.9 100 each 2  . Lancets (ACCU-CHEK MULTICLIX) lancets Use to check blood sugar 3 times per day. DX code E11.9 100 each 12  . prochlorperazine (COMPAZINE) 10 MG tablet Take 1 tablet (10 mg total) by mouth 2 (two) times daily as needed for nausea or vomiting. 10 tablet 0  . repaglinide (  PRANDIN) 2 MG tablet 1 tab with breakfast and lunch, and 2 tabs with dinner. 120 tablet 11  . albuterol (PROVENTIL HFA;VENTOLIN HFA) 108 (90 Base) MCG/ACT inhaler Inhale 2 puffs into the lungs every 6 (six) hours as needed for wheezing or shortness of breath. Reported on 02/22/2016 1 each 3  . albuterol (PROVENTIL) (2.5 MG/3ML) 0.083% nebulizer solution Take 3 mLs (2.5 mg total) by nebulization every 6 (six) hours as needed for wheezing or shortness of breath. Reported on 02/22/2016 75 mL 3  . carvedilol (COREG) 6.25 MG tablet TAKE 1 TABLET BY MOUTH 2 TIMES DAILY WITH A  MEAL. Must have Office visit for refills 6.25 tablet 3  . Fluticasone-Salmeterol (ADVAIR) 250-50 MCG/DOSE AEPB Inhale 1 puff into the lungs 2 times daily at 12 noon and 4 pm. 60 each 3  . gabapentin (NEURONTIN) 300 MG capsule Take 1 capsule (300 mg total) by mouth 3 (three) times daily. 90 capsule 3  . losartan (COZAAR) 100 MG tablet Take 1 tablet (100 mg total) by mouth daily. For Blood pressure - must have office visit for refills 30 tablet 3  . methocarbamol (ROBAXIN) 500 MG tablet Take 1 tablet (500 mg total) by mouth every 8 (eight) hours as needed for muscle spasms. 90 tablet 0  . naproxen (NAPROSYN) 500 MG tablet Take 1 tablet (500 mg total) by mouth 2 (two) times daily with a meal. 30 tablet 0  . pantoprazole (PROTONIX) 40 MG tablet Take 1 tablet (40 mg total) by mouth daily. 30 tablet 3  . sertraline (ZOLOFT) 50 MG tablet Take 1 tablet (50 mg total) by mouth daily. 30 tablet 3  . simvastatin (ZOCOR) 10 MG tablet Take 1 tablet (10 mg total) by mouth daily at 6 PM. 30 tablet 3  . SUMAtriptan (IMITREX) 100 MG tablet TAKE 1TAB AT EARLIEST ONSET OF HEADACHE. MAY REPEAT X1 IN 2 HOURS IF HEADACHE PERSISTS OR RECURS. DO NOT EXCEED 2 TABS IN 24HRS 9 tablet 2  . topiramate (TOPAMAX) 100 MG tablet Take 1 tablet (100 mg total) by mouth 2 (two) times daily. 60 tablet 3  . Insulin Pen Needle 32G X 4 MM MISC Use to inject insulin 3 times per day. (Patient not taking: Reported on 09/08/2016) 100 each 2  . oxyCODONE-acetaminophen (PERCOCET) 5-325 MG tablet Take 1-2 tablets by mouth every 4 (four) hours as needed. (Patient not taking: Reported on 09/08/2016) 20 tablet 0  . acetaminophen-codeine (TYLENOL #3) 300-30 MG tablet Take 1 tablet by mouth every 12 (twelve) hours as needed for moderate pain. (Patient not taking: Reported on 09/08/2016) 30 tablet 0  . Aspirin-Acetaminophen-Caffeine (GOODY HEADACHE PO) Take 1 Package by mouth daily as needed (for leg pain).    . hydrochlorothiazide (HYDRODIURIL) 25 MG  tablet Take 1 tablet (25 mg total) by mouth daily. (Patient not taking: Reported on 09/08/2016) 30 tablet 4   No facility-administered medications prior to visit.     ROS Review of Systems Constitutional: Negative for activity change, appetite change and fatigue.  HENT: Negative for congestion, sinus pressure and sore throat.   Eyes: Negative for visual disturbance.  Respiratory: Negative for cough, chest tightness, shortness of breath and wheezing.   Cardiovascular: Negative for chest pain and palpitations.  Gastrointestinal: Negative for abdominal distention, abdominal pain and constipation.  Endocrine: Negative for polydipsia.  Genitourinary: Negative for dysuria and frequency.  Musculoskeletal: Positive for back pain.  right shoulder pain Skin: Negative for rash.  Neurological: Negative for tremors, light-headedness and numbness.  Hematological:  Does not bruise/bleed easily.  Psychiatric/Behavioral: Negative for agitation and behavioral problems.   Objective:  BP (!) 147/92 (BP Location: Right Arm, Patient Position: Sitting, Cuff Size: Large)   Pulse 81   Temp 98.9 F (37.2 C) (Oral)   Ht 5' 1"  (1.549 m)   Wt 238 lb 6.4 oz (108.1 kg)   SpO2 97%   BMI 45.05 kg/m   BP/Weight 09/08/2016 08/21/2016 33/12/3566  Systolic BP 616 837 290  Diastolic BP 92 80 94  Wt. (Lbs) 238.4 240 240  BMI 45.05 45.35 45.35      Physical Exam Constitutional: She is oriented to person, place, and time. She appears well-developed and well-nourished.  Neck: no JVD Cardiovascular: Normal rate, normal heart sounds and intact distal pulses.   No murmur heard. Pulmonary/Chest: Effort normal and breath sounds normal. She has no wheezes. She has no rales. She exhibits no tenderness.  Abdominal: Soft. Bowel sounds are normal. She exhibits no distension and no mass. There is no tenderness.  Musculoskeletal: She exhibits tenderness (tenderness to palpation of right side of back; positive straight leg  raise).  Tenderness to palpation of the anterior aspect of the right shoulder joint; abduction limited to 70; positive Neer Hawkins test Neurological: She is alert and oriented to person, place, and time.  Normal muscle tone, normal sensation, normal muscle strength    Lab Results  Component Value Date   HGBA1C 7.0 07/28/2016    Assessment & Plan:   1. Type 2 diabetes mellitus with hyperosmolarity without coma, without long-term current use of insulin (HCC) Controlled with A1c of 7.0 which is down from 7.6 previously - Glucose (CBG) - simvastatin (ZOCOR) 10 MG tablet; Take 1 tablet (10 mg total) by mouth daily at 6 PM.  Dispense: 30 tablet; Refill: 3  2. Acute right-sided low back pain with right-sided sciatica Scheduled to see orthopedics-Dr. Percell Miller in 2 days - acetaminophen-codeine (TYLENOL #3) 300-30 MG tablet; Take 1 tablet by mouth every 12 (twelve) hours as needed for moderate pain.  Dispense: 30 tablet; Refill: 0 - methocarbamol (ROBAXIN) 500 MG tablet; Take 1 tablet (500 mg total) by mouth every 8 (eight) hours as needed for muscle spasms.  Dispense: 90 tablet; Refill: 1  3. Pain in joint of right shoulder Possible right shoulder impingement Advised to use Tylenol 3 as well as muscle relaxant Continue NSAIDs - DG Shoulder Right; Future  4. Stable asthma, mild persistent No acute exacerbation - albuterol (PROVENTIL HFA;VENTOLIN HFA) 108 (90 Base) MCG/ACT inhaler; Inhale 2 puffs into the lungs every 6 (six) hours as needed for wheezing or shortness of breath. Reported on 02/22/2016  Dispense: 1 each; Refill: 3 - albuterol (PROVENTIL) (2.5 MG/3ML) 0.083% nebulizer solution; Take 3 mLs (2.5 mg total) by nebulization every 6 (six) hours as needed for wheezing or shortness of breath. Reported on 02/22/2016  Dispense: 75 mL; Refill: 3 - Fluticasone-Salmeterol (ADVAIR) 250-50 MCG/DOSE AEPB; Inhale 1 puff into the lungs 2 times daily at 12 noon and 4 pm.  Dispense: 60 each; Refill:  3  5. Allergic conjunctivitis of right eye - olopatadine (PATANOL) 0.1 % ophthalmic solution; Place 1 drop into the right eye 2 (two) times daily.  Dispense: 5 mL; Refill: 1  6. Intractable migraine with aura without status migrainosus Controlled - topiramate (TOPAMAX) 100 MG tablet; Take 1 tablet (100 mg total) by mouth 2 (two) times daily.  Dispense: 60 tablet; Refill: 3 - SUMAtriptan (IMITREX) 100 MG tablet; TAKE 1TAB AT EARLIEST ONSET OF HEADACHE. MAY  REPEAT X1 IN 2 HOURS IF HEADACHE PERSISTS OR RECURS. DO NOT EXCEED 2 TABS IN 24HRS  Dispense: 9 tablet; Refill: 2  7. Gastritis and gastroduodenitis Stable - pantoprazole (PROTONIX) 40 MG tablet; Take 1 tablet (40 mg total) by mouth daily.  Dispense: 30 tablet; Refill: 3  8. Essential hypertension, benign Controlled - losartan (COZAAR) 100 MG tablet; Take 1 tablet (100 mg total) by mouth daily. For Blood pressure - must have office visit for refills  Dispense: 30 tablet; Refill: 3 - hydrochlorothiazide (HYDRODIURIL) 25 MG tablet; Take 1 tablet (25 mg total) by mouth daily.  Dispense: 30 tablet; Refill: 4 - carvedilol (COREG) 6.25 MG tablet; TAKE 1 TABLET BY MOUTH 2 TIMES DAILY WITH A MEAL. Must have Office visit for refills  Dispense: 6.25 tablet; Refill: 3  9. Neuropathy (HCC) Controlled - gabapentin (NEURONTIN) 300 MG capsule; Take 1 capsule (300 mg total) by mouth 3 (three) times daily.  Dispense: 90 capsule; Refill: 3   Meds ordered this encounter  Medications  . naproxen (NAPROSYN) 500 MG tablet    Sig: Take 1 tablet (500 mg total) by mouth 2 (two) times daily with a meal.    Dispense:  60 tablet    Refill:  1  . acetaminophen-codeine (TYLENOL #3) 300-30 MG tablet    Sig: Take 1 tablet by mouth every 12 (twelve) hours as needed for moderate pain.    Dispense:  30 tablet    Refill:  0  . albuterol (PROVENTIL HFA;VENTOLIN HFA) 108 (90 Base) MCG/ACT inhaler    Sig: Inhale 2 puffs into the lungs every 6 (six) hours as needed for  wheezing or shortness of breath. Reported on 02/22/2016    Dispense:  1 each    Refill:  3  . albuterol (PROVENTIL) (2.5 MG/3ML) 0.083% nebulizer solution    Sig: Take 3 mLs (2.5 mg total) by nebulization every 6 (six) hours as needed for wheezing or shortness of breath. Reported on 02/22/2016    Dispense:  75 mL    Refill:  3  . olopatadine (PATANOL) 0.1 % ophthalmic solution    Sig: Place 1 drop into the right eye 2 (two) times daily.    Dispense:  5 mL    Refill:  1  . topiramate (TOPAMAX) 100 MG tablet    Sig: Take 1 tablet (100 mg total) by mouth 2 (two) times daily.    Dispense:  60 tablet    Refill:  3  . simvastatin (ZOCOR) 10 MG tablet    Sig: Take 1 tablet (10 mg total) by mouth daily at 6 PM.    Dispense:  30 tablet    Refill:  3  . sertraline (ZOLOFT) 50 MG tablet    Sig: Take 1 tablet (50 mg total) by mouth daily.    Dispense:  30 tablet    Refill:  3  . SUMAtriptan (IMITREX) 100 MG tablet    Sig: TAKE 1TAB AT EARLIEST ONSET OF HEADACHE. MAY REPEAT X1 IN 2 HOURS IF HEADACHE PERSISTS OR RECURS. DO NOT EXCEED 2 TABS IN 24HRS    Dispense:  9 tablet    Refill:  2  . pantoprazole (PROTONIX) 40 MG tablet    Sig: Take 1 tablet (40 mg total) by mouth daily.    Dispense:  30 tablet    Refill:  3  . methocarbamol (ROBAXIN) 500 MG tablet    Sig: Take 1 tablet (500 mg total) by mouth every 8 (eight) hours as needed for  muscle spasms.    Dispense:  90 tablet    Refill:  1  . losartan (COZAAR) 100 MG tablet    Sig: Take 1 tablet (100 mg total) by mouth daily. For Blood pressure - must have office visit for refills    Dispense:  30 tablet    Refill:  3  . hydrochlorothiazide (HYDRODIURIL) 25 MG tablet    Sig: Take 1 tablet (25 mg total) by mouth daily.    Dispense:  30 tablet    Refill:  4  . gabapentin (NEURONTIN) 300 MG capsule    Sig: Take 1 capsule (300 mg total) by mouth 3 (three) times daily.    Dispense:  90 capsule    Refill:  3  . Fluticasone-Salmeterol (ADVAIR)  250-50 MCG/DOSE AEPB    Sig: Inhale 1 puff into the lungs 2 times daily at 12 noon and 4 pm.    Dispense:  60 each    Refill:  3  . carvedilol (COREG) 6.25 MG tablet    Sig: TAKE 1 TABLET BY MOUTH 2 TIMES DAILY WITH A MEAL. Must have Office visit for refills    Dispense:  6.25 tablet    Refill:  3    Follow-up: Return in about 3 months (around 12/07/2016) for Follow-up on chronic medical conditions.   Arnoldo Morale MD

## 2016-09-10 ENCOUNTER — Other Ambulatory Visit: Payer: Self-pay | Admitting: Orthopedic Surgery

## 2016-09-10 DIAGNOSIS — M25511 Pain in right shoulder: Secondary | ICD-10-CM | POA: Diagnosis not present

## 2016-09-10 DIAGNOSIS — M545 Low back pain, unspecified: Secondary | ICD-10-CM

## 2016-09-10 DIAGNOSIS — G8929 Other chronic pain: Secondary | ICD-10-CM

## 2016-09-11 ENCOUNTER — Other Ambulatory Visit: Payer: Medicare Other

## 2016-09-11 ENCOUNTER — Other Ambulatory Visit: Payer: Self-pay | Admitting: Family Medicine

## 2016-09-11 DIAGNOSIS — I10 Essential (primary) hypertension: Secondary | ICD-10-CM

## 2016-09-11 MED ORDER — CARVEDILOL 6.25 MG PO TABS: ORAL_TABLET | ORAL | 3 refills | Status: DC

## 2016-09-25 ENCOUNTER — Other Ambulatory Visit: Payer: Medicare Other

## 2016-09-29 ENCOUNTER — Other Ambulatory Visit: Payer: Self-pay | Admitting: *Deleted

## 2016-09-29 DIAGNOSIS — M5441 Lumbago with sciatica, right side: Secondary | ICD-10-CM

## 2016-09-29 MED ORDER — OLOPATADINE HCL 0.1 % OP SOLN: 1.0000 [drp] | Freq: Two times a day (BID) | OPHTHALMIC | 0 refills | Status: DC

## 2016-09-29 MED ORDER — PATADAY 0.2 % OP SOLN: 1.0000 [drp] | Freq: Every day | OPHTHALMIC | 1 refills | Status: DC

## 2016-09-29 NOTE — Telephone Encounter (Signed)
Medication Olopatadine not covered by insurance. Changed to Pataday 0.2 soln which is covered by insurance by verbal order per Dr. Jarold Song. Carilyn Goodpasture, RN

## 2016-09-29 NOTE — Telephone Encounter (Signed)
Pt. not covered for Olopatadine 0.1 drop. May cover for Pataday 0.2% drop into right eye daily.

## 2016-10-06 ENCOUNTER — Other Ambulatory Visit: Payer: Self-pay | Admitting: Orthopedic Surgery

## 2016-10-06 ENCOUNTER — Ambulatory Visit
Admission: RE | Admit: 2016-10-06 | Discharge: 2016-10-06 | Disposition: A | Discharge status: Home / Self Care | Payer: Medicare Other | Source: Ambulatory Visit | Attending: Orthopedic Surgery | Admitting: Orthopedic Surgery

## 2016-10-06 DIAGNOSIS — M545 Low back pain, unspecified: Secondary | ICD-10-CM

## 2016-10-06 DIAGNOSIS — G8929 Other chronic pain: Secondary | ICD-10-CM

## 2016-10-28 ENCOUNTER — Ambulatory Visit: Payer: Medicare Other | Admitting: Endocrinology

## 2016-11-17 ENCOUNTER — Encounter: Payer: Self-pay | Admitting: Endocrinology

## 2016-11-17 ENCOUNTER — Ambulatory Visit (INDEPENDENT_AMBULATORY_CARE_PROVIDER_SITE_OTHER): Payer: Medicare Other | Admitting: Endocrinology

## 2016-11-17 VITALS — BP 162/98 | HR 91 | Ht 61.0 in | Wt 232.0 lb

## 2016-11-17 DIAGNOSIS — E1100 Type 2 diabetes mellitus with hyperosmolarity without nonketotic hyperglycemic-hyperosmolar coma (NKHHC): Secondary | ICD-10-CM | POA: Diagnosis not present

## 2016-11-17 LAB — POCT GLYCOSYLATED HEMOGLOBIN (HGB A1C): Hemoglobin A1C: 6.5

## 2016-11-17 NOTE — Patient Instructions (Addendum)
Please see your PCP soon, due to your headache and high blood pressure.     If they can't see you today, you should go to an urgent care.   Please come back for a follow-up appointment in 3-4 months.   check your blood sugar once a day.  vary the time of day when you check, between before the 3 meals, and at bedtime.  also check if you have symptoms of your blood sugar being too high or too low.  please keep a record of the readings and bring it to your next appointment here (or you can bring the meter itself).  You can write it on any piece of paper.  please call us sooner if your blood sugar goes below 70, or if you have a lot of readings over 200.

## 2016-11-17 NOTE — Progress Notes (Signed)
Subjective:    Patient ID: [name], female    DOB: 08-Feb-1971, 46 y.o.   MRN: [id number]  HPI Pt returns for f/u of diabetes mellitus: DM type: 2 Dx'ed: 5366 Complications: painful polyneuropathy.  Therapy: trulicity and 3 oral meds.   GDM: never DKA: never Severe hypoglycemia: never.  Pancreatitis: never.   Other: she declines weight loss surgery; edema precludes pioglitizone rx; she took insulin from 2012-2017.  Interval history: pt says cbg's are well-controlled.   She has long h/o headache.  It is worse recently.   Past Medical History:  Diagnosis Date  . Arthritis   . Asthma   . Chronic back pain   . Chronic chest pain   . Depression   . DM (diabetes mellitus) ([place] [facility])    INSULIN DEPENDENT  . GERD (gastroesophageal reflux disease)   . Headache(784.0)   . Hypertension   . Hypokalemia     Past Surgical History:  Procedure Laterality Date  . APPENDECTOMY    . CESAREAN SECTION     x 3  . CHOLECYSTECTOMY N/A 03/02/2014   Procedure: LAPAROSCOPIC CHOLECYSTECTOMY;  Surgeon: Joyice Faster. Cornett, MD;  Location: [place];  Service: General;  Laterality: N/A;  . HERNIA REPAIR    . ROTATOR CUFF REPAIR    . VESICOVAGINAL FISTULA CLOSURE W/ TAH  2009    Social History   Social History  . Marital status: Married    Spouse name: N/A  . Number of children: N/A  . Years of education: N/A   Occupational History  . Not on file.   Social History Main Topics  . Smoking status: Current Every Day Smoker    Packs/day: 0.25    Years: 22.00    Types: Cigarettes  . Smokeless tobacco: Never Used  . Alcohol use 0.6 - 1.2 oz/week    1 - 2 Glasses of wine per week     Comment: socially  . Drug use: No  . Sexual activity: Yes    Partners: Male   Other Topics Concern  . Not on file   Social History Narrative  . No narrative on file    Current Outpatient Prescriptions on File Prior to Visit  Medication Sig Dispense Refill  . acetaminophen-codeine (TYLENOL  #3) 300-30 MG tablet Take 1 tablet by mouth every 12 (twelve) hours as needed for moderate pain. 30 tablet 0  . albuterol (PROVENTIL HFA;VENTOLIN HFA) 108 (90 Base) MCG/ACT inhaler Inhale 2 puffs into the lungs every 6 (six) hours as needed for wheezing or shortness of breath. Reported on 02/22/2016 1 each 3  . albuterol (PROVENTIL) (2.5 MG/3ML) 0.083% nebulizer solution Take 3 mLs (2.5 mg total) by nebulization every 6 (six) hours as needed for wheezing or shortness of breath. Reported on 02/22/2016 75 mL 3  . aspirin EC 325 MG tablet Take 325 mg by mouth daily.    . Blood Glucose Monitoring Suppl (ACCU-CHEK AVIVA PLUS) w/Device KIT Use to check blood sugar 3 times per day. DX CODE: E11.9 1 kit 2  . bromocriptine (PARLODEL) 2.5 MG tablet 1/4 tab daily 20 tablet 2  . carvedilol (COREG) 6.25 MG tablet TAKE 1 TABLET BY MOUTH 2 TIMES DAILY WITH A MEAL. 60 tablet 3  . dapagliflozin propanediol (FARXIGA) 10 MG TABS tablet Take 10 mg by mouth daily. 30 tablet 11  . Dulaglutide (TRULICITY) 1.5 YQ/0.3KV SOPN Inject 1.5 mg into the skin once a week. 4 pen 11  . Fluticasone-Salmeterol (ADVAIR) 250-50 MCG/DOSE AEPB Inhale 1  puff into the lungs 2 times daily at 12 noon and 4 pm. 60 each 3  . gabapentin (NEURONTIN) 300 MG capsule Take 1 capsule (300 mg total) by mouth 3 (three) times daily. 90 capsule 3  . glucose blood (ACCU-CHEK AVIVA PLUS) test strip Use to check blood sugar 3 times per day.DX Code E11.9 100 each 12  . hydrochlorothiazide (HYDRODIURIL) 25 MG tablet Take 1 tablet (25 mg total) by mouth daily. 30 tablet 4  . Insulin Pen Needle 32G X 4 MM MISC Use to inject insulin 3 times per day. 100 each 2  . Lancet Devices (ACCU-CHEK SOFTCLIX) lancets Use to check blood sugar 3 times per day dx code E11.9 100 each 2  . Lancets (ACCU-CHEK MULTICLIX) lancets Use to check blood sugar 3 times per day. DX code E11.9 100 each 12  . losartan (COZAAR) 100 MG tablet Take 1 tablet (100 mg total) by mouth daily. For Blood  pressure - must have office visit for refills 30 tablet 3  . methocarbamol (ROBAXIN) 500 MG tablet Take 1 tablet (500 mg total) by mouth every 8 (eight) hours as needed for muscle spasms. 90 tablet 1  . naproxen (NAPROSYN) 500 MG tablet Take 1 tablet (500 mg total) by mouth 2 (two) times daily with a meal. 60 tablet 1  . olopatadine (PATANOL) 0.1 % ophthalmic solution Place 1 drop into the right eye 2 (two) times daily. 5 mL 0  . oxyCODONE-acetaminophen (PERCOCET) 5-325 MG tablet Take 1-2 tablets by mouth every 4 (four) hours as needed. 20 tablet 0  . pantoprazole (PROTONIX) 40 MG tablet Take 1 tablet (40 mg total) by mouth daily. 30 tablet 3  . prochlorperazine (COMPAZINE) 10 MG tablet Take 1 tablet (10 mg total) by mouth 2 (two) times daily as needed for nausea or vomiting. 10 tablet 0  . repaglinide (PRANDIN) 2 MG tablet 1 tab with breakfast and lunch, and 2 tabs with dinner. 120 tablet 11  . sertraline (ZOLOFT) 50 MG tablet Take 1 tablet (50 mg total) by mouth daily. 30 tablet 3  . simvastatin (ZOCOR) 10 MG tablet Take 1 tablet (10 mg total) by mouth daily at 6 PM. 30 tablet 3  . SUMAtriptan (IMITREX) 100 MG tablet TAKE 1TAB AT EARLIEST ONSET OF HEADACHE. MAY REPEAT X1 IN 2 HOURS IF HEADACHE PERSISTS OR RECURS. DO NOT EXCEED 2 TABS IN 24HRS 9 tablet 2  . topiramate (TOPAMAX) 100 MG tablet Take 1 tablet (100 mg total) by mouth 2 (two) times daily. 60 tablet 3  . [DISCONTINUED] promethazine (PHENERGAN) 25 MG tablet Take 1 tablet (25 mg total) by mouth every 6 (six) hours as needed for nausea. (Patient not taking: Reported on 12/13/2014) 20 tablet 0   No current facility-administered medications on file prior to visit.     Allergies  Allergen Reactions  . Reglan [Metoclopramide] Other (See Comments)    Panic attack    Family History  Problem Relation Age of Onset  . Heart attack Mother   . Stroke Mother   . Diabetes Mother   . Hypertension Mother   . Arthritis Mother   . Stroke Father     . Hypertension Sister   . Diabetes Sister   . Seizures Brother   . Diabetes Brother     BP (!) 162/98   Pulse 91   Ht '5\' 1"'$  (1.549 m)   Wt 232 lb (105.2 kg)   SpO2 97%   BMI 43.84 kg/m    Review  of Systems Denies LOC and visual loss.      Objective:   Physical Exam VITAL SIGNS:  See vs page GENERAL: no distress Pulses: dorsalis pedis intact bilat.   MSK: no deformity of the feet CV: trace bilat leg edema.   Skin:  no ulcer on the feet.  No rash on the feet.  normal color and temp on the feet.  Neuro: sensation is intact to touch on the feet, but decreased from normal.     A1c=6.5%    Assessment & Plan:  Type 2 DM: well-controlled.  HTN/headache: Patient is advised the following: Patient Instructions  Please see your PCP soon, due to your headache and high blood pressure.     If they can't see you today, you should go to an urgent care.   Please come back for a follow-up appointment in 3-4 months.   check your blood sugar once a day.  vary the time of day when you check, between before the 3 meals, and at bedtime.  also check if you have symptoms of your blood sugar being too high or too low.  please keep a record of the readings and bring it to your next appointment here (or you can bring the meter itself).  You can write it on any piece of paper.  please call us sooner if your blood sugar goes below 70, or if you have a lot of readings over 200.

## 2016-12-03 ENCOUNTER — Ambulatory Visit: Payer: Medicare Other | Admitting: Family Medicine

## 2016-12-17 ENCOUNTER — Ambulatory Visit
Admission: RE | Admit: 2016-12-17 | Discharge: 2016-12-17 | Disposition: A | Discharge status: Home / Self Care | Payer: Medicare Other | Source: Ambulatory Visit | Attending: Orthopedic Surgery | Admitting: Orthopedic Surgery

## 2016-12-17 DIAGNOSIS — M48061 Spinal stenosis, lumbar region without neurogenic claudication: Secondary | ICD-10-CM | POA: Diagnosis not present

## 2016-12-17 DIAGNOSIS — G8929 Other chronic pain: Secondary | ICD-10-CM

## 2016-12-17 DIAGNOSIS — M545 Low back pain: Principal | ICD-10-CM

## 2016-12-29 DIAGNOSIS — M5431 Sciatica, right side: Secondary | ICD-10-CM | POA: Diagnosis not present

## 2017-01-05 ENCOUNTER — Ambulatory Visit: Payer: Medicare Other | Attending: Family Medicine | Admitting: Family Medicine

## 2017-01-05 ENCOUNTER — Encounter: Payer: Self-pay | Admitting: Family Medicine

## 2017-01-05 VITALS — BP 153/91 | HR 82 | Temp 98.1°F | Resp 18 | Ht 61.0 in | Wt 239.6 lb

## 2017-01-05 DIAGNOSIS — M5126 Other intervertebral disc displacement, lumbar region: Secondary | ICD-10-CM

## 2017-01-05 DIAGNOSIS — J45909 Unspecified asthma, uncomplicated: Secondary | ICD-10-CM | POA: Insufficient documentation

## 2017-01-05 DIAGNOSIS — K219 Gastro-esophageal reflux disease without esophagitis: Secondary | ICD-10-CM | POA: Insufficient documentation

## 2017-01-05 DIAGNOSIS — I10 Essential (primary) hypertension: Secondary | ICD-10-CM

## 2017-01-05 DIAGNOSIS — F329 Major depressive disorder, single episode, unspecified: Secondary | ICD-10-CM | POA: Insufficient documentation

## 2017-01-05 DIAGNOSIS — E1100 Type 2 diabetes mellitus with hyperosmolarity without nonketotic hyperglycemic-hyperosmolar coma (NKHHC): Secondary | ICD-10-CM | POA: Diagnosis not present

## 2017-01-05 LAB — GLUCOSE, POCT (MANUAL RESULT ENTRY): POC Glucose: 115 mg/dl — AB (ref 70–99)

## 2017-01-05 MED ORDER — AMLODIPINE BESYLATE 5 MG PO TABS: 5.0000 mg | ORAL_TABLET | Freq: Every day | ORAL | 1 refills | Status: DC

## 2017-01-05 NOTE — Progress Notes (Signed)
Subjective:  Patient ID: [name], female    DOB: 12/20/1970  Age: 46 y.o. MRN: [id number]  CC: f/u HTN, DM  [name] is a 46 y.o. female with a PMH of HTN, DM2, Asthma, LBP, depression, and GERD presents for f/u of HTN and DM. Patient is currently on Carvedilol 6.25mg , Losartan 100mg , and HCTZ 25mg . Taking medications as directed. However, her BP is still out of control. She believes pain is contributing to her elevated BP. Admits to constant pain and likely persistently high BP. Pain is due to disc herniation, facet arthopathy, and mild constriction of the  thecal sac. MRI conducted on 12/17/16. Currently managed by ortho. Admits to not taking Gabapentin as directed, "usually one a day". However, LBP has progressively been increasing and she has increased Gabapentin to BID. Finds relief with the Norco, Pt reports an upcoming appointment to have epidural injections. Lastly, her diabetes is seemingly well controlled. A1c 6.5% on 11/17/16. Managed by [facility]. Curently on Prandin, Trulicity, and Farxiga,  Does not endorse polyuria, polydipsia, polyphagia. Denies CP, SOB, HA, abdominal pain, f/c/n/v, rash, or GI/GU sxs.   Review of Systems  Constitutional: Negative for chills, fever and malaise/fatigue.  Eyes: Negative for blurred vision.  Respiratory: Negative for shortness of breath.   Cardiovascular: Negative for chest pain and palpitations.  Gastrointestinal: Negative for abdominal pain and nausea.  Genitourinary: Negative for dysuria and hematuria.  Musculoskeletal: Positive for back pain. Negative for joint pain and myalgias.  Skin: Negative for rash.  Neurological: Negative for tingling and headaches.       RLE intermittent numbness.  Psychiatric/Behavioral: Positive for depression (2/2 chronic pain). The patient is not nervous/anxious.     Objective:  BP (!) 153/91 (BP Location: Right Arm, Patient Position: Sitting, Cuff Size:  Large)   Pulse 82   Temp 98.1 F (36.7 C) (Oral)   Resp 18   Ht 5\' 1"  (1.549 m)   Wt 239 lb 9.6 oz (108.7 kg)   SpO2 99%   BMI 45.27 kg/m   BP/Weight 01/05/2017 11/17/2016 00/92/3300  Systolic BP 762 263 335  Diastolic BP 91 98 92  Wt. (Lbs) 239.6 232 238.4  BMI 45.27 43.84 45.05      Physical Exam  Constitutional: She is oriented to person, place, and time.  Well developed, obese, in discomfort 2/2 back pain, polite  HENT:  Head: Normocephalic and atraumatic.  Eyes: No scleral icterus.  Neck: Normal range of motion.  Cardiovascular: Normal rate, regular rhythm and normal heart sounds.   Pulmonary/Chest: Effort normal and breath sounds normal.  Musculoskeletal: She exhibits no edema.  Limp favoring the right side.  Neurological: She is alert and oriented to person, place, and time. No cranial nerve deficit. Coordination normal.  Skin: Skin is warm and dry. No rash noted. No erythema. No pallor.  Psychiatric: She has a normal mood and affect. Her behavior is normal. Thought content normal.  Vitals reviewed.    Assessment & Plan:   1. Type 2 diabetes mellitus with hyperosmolarity without coma, without long-term current use of insulin (HCC) - Glucose (CBG) 115 mg/dL in clinic today - A1c 6.5% on 11/17/16 - Comprehensive metabolic panel  2. Hypertension, unspecified type - Stop Carvedilol 6.25 mg. Pt w/hx of asthma and needs further optimization of BP. - amLODipine (NORVASC) 5 MG tablet; Take 1 tablet (5 mg total) by mouth daily.  Dispense: 90 tablet; Refill: 1 - Lipid panel  3. Lumbar disc herniation -  Keep orthopedic appointment for this week.    Meds ordered this encounter  Medications  . amLODipine (NORVASC) 5 MG tablet    Sig: Take 1 tablet (5 mg total) by mouth daily.    Dispense:  90 tablet    Refill:  1    Order Specific Question:   Supervising Provider    Answer:   Tresa Garter [id number]    Follow-up: Return in about 5 weeks (around 02/09/2017)  for HTN and DM.   Clent Demark PA

## 2017-01-05 NOTE — Progress Notes (Signed)
Patient is here for FU  Patient complains of back pain being present for the past 2 weeks. Pain is scaled at a 9 currently.  Patient has taken medication today. Patient has eaten today.

## 2017-01-05 NOTE — Patient Instructions (Signed)
Managing Your Hypertension Hypertension is commonly called high blood pressure. This is when the force of your blood pressing against the walls of your arteries is too strong. Arteries are blood vessels that carry blood from your heart throughout your body. Hypertension forces the heart to work harder to pump blood, and may cause the arteries to become narrow or stiff. Having untreated or uncontrolled hypertension can cause heart attack, stroke, kidney disease, and other problems. What are blood pressure readings? A blood pressure reading consists of a higher number over a lower number. Ideally, your blood pressure should be below 120/80. The first ("top") number is called the systolic pressure. It is a measure of the pressure in your arteries as your heart beats. The second ("bottom") number is called the diastolic pressure. It is a measure of the pressure in your arteries as the heart relaxes. What does my blood pressure reading mean? Blood pressure is classified into four stages. Based on your blood pressure reading, your health care provider may use the following stages to determine what type of treatment you need, if any. Systolic pressure and diastolic pressure are measured in a unit called mm Hg. Normal   Systolic pressure: below 120.  Diastolic pressure: below 80. Elevated   Systolic pressure: 120-129.  Diastolic pressure: below 80. Hypertension stage 1     Diastolic pressure: 80-89. Hypertension stage 2   Systolic pressure: 140 or above.  Diastolic pressure: 90 or above. What health risks are associated with hypertension? Managing your hypertension is an important responsibility. Uncontrolled hypertension can lead to:  A heart attack.  A stroke.  A weakened blood vessel (aneurysm).  Heart failure.  Kidney damage.  Eye damage.  Metabolic syndrome.  Memory and concentration problems. What changes can I make to manage my hypertension? Eating and drinking   Eat a  diet that is high in fiber and potassium, and low in salt (sodium), added sugar, and fat. An example eating plan is called the DASH (Dietary Approaches to Stop Hypertension) diet. To eat this way:  Eat plenty of fresh fruits and vegetables. Try to fill half of your plate at each meal with fruits and vegetables.  Eat whole grains, such as whole wheat pasta, brown rice, or whole grain bread. Fill about one quarter of your plate with whole grains.  Eat low-fat diary products.  Avoid fatty cuts of meat, processed or cured meats, and poultry with skin. Fill about one quarter of your plate with lean proteins such as fish, chicken without skin, beans, eggs, and tofu.  Avoid premade and processed foods. These tend to be higher in sodium, added sugar, and fat.     Lifestyle   Work with your health care provider to maintain a healthy body weight, or to lose weight. Ask what an ideal weight is for you.  Get at least 30 minutes of exercise that causes your heart to beat faster (aerobic exercise) most days of the week. Activities may include walking, swimming, or biking.       Monitoring   Monitor your blood pressure at home as told by your health care provider. Your personal target blood pressure may vary depending on your medical conditions, your age, and other factors.  Have your blood pressure checked regularly, as often as told by your health care provider. Working with your health care provider   Review all the medicines you take with your health care provider because there may be side effects or interactions.  Talk with your health   care provider about your diet, exercise habits, and other lifestyle factors that may be contributing to hypertension.  Visit your health care provider regularly. Your health care provider can help you create and adjust your plan for managing hypertension. Will I need medicine to control my blood pressure? Your health care provider may prescribe medicine if  lifestyle changes are not enough to get your blood pressure under control, and if:  Your systolic blood pressure is 130 or higher.  Your diastolic blood pressure is 80 or higher. Take medicines only as told by your health care provider. Follow the directions carefully. Blood pressure medicines must be taken as prescribed. The medicine does not work as well when you skip doses. Skipping doses also puts you at risk for problems. Contact a health care provider if:  You think you are having a reaction to medicines you have taken.  You have repeated (recurrent) headaches.  You feel dizzy.  You have swelling in your ankles.  You have trouble with your vision. Get help right away if:  You develop a severe headache or confusion.  You have unusual weakness or numbness, or you feel faint.  You have severe pain in your chest or abdomen.  You vomit repeatedly.  You have trouble breathing. Summary  Hypertension is when the force of blood pumping through your arteries is too strong. If this condition is not controlled, it may put you at risk for serious complications.  Your personal target blood pressure may vary depending on your medical conditions, your age, and other factors. For most people, a normal blood pressure is less than 120/80.  Hypertension is managed by lifestyle changes, medicines, or both. Lifestyle changes include weight loss, eating a healthy, low-sodium diet, exercising more, and limiting alcohol. This information is not intended to replace advice given to you by your health care provider. Make sure you discuss any questions you have with your health care provider. Document Released: 06/02/2012 Document Revised: 08/06/2016 Document Reviewed: 08/06/2016 Elsevier Interactive Patient Education  2017 [employer] Inc[employer]  

## 2017-01-06 LAB — COMPREHENSIVE METABOLIC PANEL
ALT: 12 IU/L (ref 0–32)
AST: 14 IU/L (ref 0–40)
Albumin/Globulin Ratio: 1.4 (ref 1.2–2.2)
Albumin: 4.1 g/dL (ref 3.5–5.5)
Alkaline Phosphatase: 84 IU/L (ref 39–117)
BILIRUBIN TOTAL: 0.2 mg/dL (ref 0.0–1.2)
BUN/Creatinine Ratio: 15 (ref 9–23)
BUN: 15 mg/dL (ref 6–24)
CALCIUM: 9.2 mg/dL (ref 8.7–10.2)
CHLORIDE: 103 mmol/L (ref 96–106)
CO2: 19 mmol/L (ref 18–29)
Creatinine, Ser: 0.99 mg/dL (ref 0.57–1.00)
GFR, EST AFRICAN AMERICAN: 78 mL/min/{1.73_m2} (ref >59)
GFR, EST NON AFRICAN AMERICAN: 68 mL/min/{1.73_m2} (ref >59)
GLUCOSE: 115 mg/dL — AB (ref 65–99)
Globulin, Total: 3 g/dL (ref 1.5–4.5)
Potassium: 4.6 mmol/L (ref 3.5–5.2)
Sodium: 144 mmol/L (ref 134–144)
TOTAL PROTEIN: 7.1 g/dL (ref 6.0–8.5)

## 2017-01-06 LAB — LIPID PANEL
Chol/HDL Ratio: 3.6 ratio (ref 0.0–4.4)
Cholesterol, Total: 175 mg/dL (ref 100–199)
HDL: 48 mg/dL (ref >39)
LDL Calculated: 101 mg/dL — ABNORMAL HIGH (ref 0–99)
Triglycerides: 128 mg/dL (ref 0–149)
VLDL CHOLESTEROL CAL: 26 mg/dL (ref 5–40)

## 2017-01-26 ENCOUNTER — Telehealth: Payer: Self-pay | Admitting: Endocrinology

## 2017-01-26 MED ORDER — GLUCOSE BLOOD VI STRP: ORAL_STRIP | 12 refills | Status: DC

## 2017-01-26 MED ORDER — DULAGLUTIDE 1.5 MG/0.5ML ~~LOC~~ SOAJ: 1.5000 mg | SUBCUTANEOUS | 11 refills | Status: DC

## 2017-01-26 NOTE — Telephone Encounter (Signed)
trulicity and test strips to be called into [facility] on [address] please

## 2017-01-26 NOTE — Telephone Encounter (Signed)
Refills submitted.  

## 2017-01-27 NOTE — Telephone Encounter (Signed)
Patient said her pharmacy informed her that the prescription for the test strips that she has been getting is not covered by Medicaid or Medicare anymore. Is there something else she can use.  Please advise.

## 2017-01-28 NOTE — Telephone Encounter (Signed)
Spoke with patient and advised her to find out what her insurance will cover and call back- she stated an understanding

## 2017-01-29 DIAGNOSIS — M5431 Sciatica, right side: Secondary | ICD-10-CM | POA: Diagnosis not present

## 2017-02-18 ENCOUNTER — Inpatient Hospital Stay (HOSPITAL_COMMUNITY)
Admission: EM | Admit: 2017-02-18 | Discharge: 2017-02-27 | DRG: 207 | Disposition: A | Discharge status: Rehabilitation Facility | Payer: Medicare Other | Attending: Family Medicine | Admitting: Family Medicine

## 2017-02-18 ENCOUNTER — Emergency Department (HOSPITAL_COMMUNITY): Payer: Medicare Other

## 2017-02-18 ENCOUNTER — Encounter (HOSPITAL_COMMUNITY): Payer: Self-pay | Admitting: Emergency Medicine

## 2017-02-18 DIAGNOSIS — R34 Anuria and oliguria: Secondary | ICD-10-CM | POA: Diagnosis present

## 2017-02-18 DIAGNOSIS — R9431 Abnormal electrocardiogram [ECG] [EKG]: Secondary | ICD-10-CM | POA: Diagnosis present

## 2017-02-18 DIAGNOSIS — Y95 Nosocomial condition: Secondary | ICD-10-CM | POA: Diagnosis not present

## 2017-02-18 DIAGNOSIS — Z9289 Personal history of other medical treatment: Secondary | ICD-10-CM

## 2017-02-18 DIAGNOSIS — I517 Cardiomegaly: Secondary | ICD-10-CM | POA: Diagnosis not present

## 2017-02-18 DIAGNOSIS — G92 Toxic encephalopathy: Secondary | ICD-10-CM | POA: Diagnosis not present

## 2017-02-18 DIAGNOSIS — Z452 Encounter for adjustment and management of vascular access device: Secondary | ICD-10-CM

## 2017-02-18 DIAGNOSIS — E876 Hypokalemia: Secondary | ICD-10-CM | POA: Diagnosis present

## 2017-02-18 DIAGNOSIS — E1142 Type 2 diabetes mellitus with diabetic polyneuropathy: Secondary | ICD-10-CM | POA: Diagnosis not present

## 2017-02-18 DIAGNOSIS — M5126 Other intervertebral disc displacement, lumbar region: Secondary | ICD-10-CM | POA: Diagnosis present

## 2017-02-18 DIAGNOSIS — E875 Hyperkalemia: Secondary | ICD-10-CM | POA: Diagnosis not present

## 2017-02-18 DIAGNOSIS — E46 Unspecified protein-calorie malnutrition: Secondary | ICD-10-CM | POA: Diagnosis not present

## 2017-02-18 DIAGNOSIS — F329 Major depressive disorder, single episode, unspecified: Secondary | ICD-10-CM | POA: Diagnosis present

## 2017-02-18 DIAGNOSIS — J45901 Unspecified asthma with (acute) exacerbation: Secondary | ICD-10-CM | POA: Diagnosis present

## 2017-02-18 DIAGNOSIS — R059 Cough, unspecified: Secondary | ICD-10-CM

## 2017-02-18 DIAGNOSIS — I248 Other forms of acute ischemic heart disease: Secondary | ICD-10-CM | POA: Diagnosis present

## 2017-02-18 DIAGNOSIS — D62 Acute posthemorrhagic anemia: Secondary | ICD-10-CM | POA: Diagnosis not present

## 2017-02-18 DIAGNOSIS — Z6841 Body Mass Index (BMI) 40.0 and over, adult: Secondary | ICD-10-CM

## 2017-02-18 DIAGNOSIS — F411 Generalized anxiety disorder: Secondary | ICD-10-CM | POA: Diagnosis not present

## 2017-02-18 DIAGNOSIS — J9600 Acute respiratory failure, unspecified whether with hypoxia or hypercapnia: Secondary | ICD-10-CM

## 2017-02-18 DIAGNOSIS — R042 Hemoptysis: Secondary | ICD-10-CM | POA: Diagnosis not present

## 2017-02-18 DIAGNOSIS — Z791 Long term (current) use of non-steroidal anti-inflammatories (NSAID): Secondary | ICD-10-CM | POA: Diagnosis not present

## 2017-02-18 DIAGNOSIS — J4551 Severe persistent asthma with (acute) exacerbation: Secondary | ICD-10-CM | POA: Diagnosis not present

## 2017-02-18 DIAGNOSIS — J9602 Acute respiratory failure with hypercapnia: Secondary | ICD-10-CM | POA: Diagnosis present

## 2017-02-18 DIAGNOSIS — I10 Essential (primary) hypertension: Secondary | ICD-10-CM | POA: Diagnosis not present

## 2017-02-18 DIAGNOSIS — D72829 Elevated white blood cell count, unspecified: Secondary | ICD-10-CM | POA: Diagnosis not present

## 2017-02-18 DIAGNOSIS — E785 Hyperlipidemia, unspecified: Secondary | ICD-10-CM | POA: Diagnosis present

## 2017-02-18 DIAGNOSIS — Z7982 Long term (current) use of aspirin: Secondary | ICD-10-CM

## 2017-02-18 DIAGNOSIS — R0602 Shortness of breath: Secondary | ICD-10-CM | POA: Diagnosis not present

## 2017-02-18 DIAGNOSIS — G4733 Obstructive sleep apnea (adult) (pediatric): Secondary | ICD-10-CM | POA: Diagnosis present

## 2017-02-18 DIAGNOSIS — N179 Acute kidney failure, unspecified: Secondary | ICD-10-CM | POA: Diagnosis not present

## 2017-02-18 DIAGNOSIS — Z833 Family history of diabetes mellitus: Secondary | ICD-10-CM | POA: Diagnosis not present

## 2017-02-18 DIAGNOSIS — K219 Gastro-esophageal reflux disease without esophagitis: Secondary | ICD-10-CM | POA: Diagnosis present

## 2017-02-18 DIAGNOSIS — G8929 Other chronic pain: Secondary | ICD-10-CM | POA: Diagnosis present

## 2017-02-18 DIAGNOSIS — G7281 Critical illness myopathy: Secondary | ICD-10-CM | POA: Diagnosis not present

## 2017-02-18 DIAGNOSIS — G43909 Migraine, unspecified, not intractable, without status migrainosus: Secondary | ICD-10-CM | POA: Diagnosis present

## 2017-02-18 DIAGNOSIS — Z7951 Long term (current) use of inhaled steroids: Secondary | ICD-10-CM | POA: Diagnosis not present

## 2017-02-18 DIAGNOSIS — J189 Pneumonia, unspecified organism: Secondary | ICD-10-CM | POA: Diagnosis not present

## 2017-02-18 DIAGNOSIS — Z8249 Family history of ischemic heart disease and other diseases of the circulatory system: Secondary | ICD-10-CM

## 2017-02-18 DIAGNOSIS — M1990 Unspecified osteoarthritis, unspecified site: Secondary | ICD-10-CM | POA: Diagnosis present

## 2017-02-18 DIAGNOSIS — R05 Cough: Secondary | ICD-10-CM

## 2017-02-18 DIAGNOSIS — F17210 Nicotine dependence, cigarettes, uncomplicated: Secondary | ICD-10-CM | POA: Diagnosis not present

## 2017-02-18 DIAGNOSIS — J4552 Severe persistent asthma with status asthmaticus: Secondary | ICD-10-CM | POA: Diagnosis not present

## 2017-02-18 DIAGNOSIS — J9621 Acute and chronic respiratory failure with hypoxia: Secondary | ICD-10-CM | POA: Diagnosis not present

## 2017-02-18 DIAGNOSIS — Y92239 Unspecified place in hospital as the place of occurrence of the external cause: Secondary | ICD-10-CM | POA: Diagnosis not present

## 2017-02-18 DIAGNOSIS — G6281 Critical illness polyneuropathy: Secondary | ICD-10-CM

## 2017-02-18 DIAGNOSIS — E1165 Type 2 diabetes mellitus with hyperglycemia: Secondary | ICD-10-CM | POA: Diagnosis not present

## 2017-02-18 DIAGNOSIS — D6489 Other specified anemias: Secondary | ICD-10-CM | POA: Diagnosis present

## 2017-02-18 DIAGNOSIS — Z79899 Other long term (current) drug therapy: Secondary | ICD-10-CM

## 2017-02-18 DIAGNOSIS — J9690 Respiratory failure, unspecified, unspecified whether with hypoxia or hypercapnia: Secondary | ICD-10-CM | POA: Diagnosis not present

## 2017-02-18 DIAGNOSIS — G894 Chronic pain syndrome: Secondary | ICD-10-CM | POA: Diagnosis not present

## 2017-02-18 DIAGNOSIS — Z23 Encounter for immunization: Secondary | ICD-10-CM

## 2017-02-18 DIAGNOSIS — I959 Hypotension, unspecified: Secondary | ICD-10-CM | POA: Diagnosis present

## 2017-02-18 DIAGNOSIS — J398 Other specified diseases of upper respiratory tract: Secondary | ICD-10-CM | POA: Diagnosis not present

## 2017-02-18 DIAGNOSIS — J9601 Acute respiratory failure with hypoxia: Secondary | ICD-10-CM | POA: Diagnosis not present

## 2017-02-18 DIAGNOSIS — E872 Acidosis: Secondary | ICD-10-CM | POA: Diagnosis not present

## 2017-02-18 DIAGNOSIS — E1169 Type 2 diabetes mellitus with other specified complication: Secondary | ICD-10-CM | POA: Diagnosis not present

## 2017-02-18 DIAGNOSIS — T380X5A Adverse effect of glucocorticoids and synthetic analogues, initial encounter: Secondary | ICD-10-CM | POA: Diagnosis present

## 2017-02-18 DIAGNOSIS — E119 Type 2 diabetes mellitus without complications: Secondary | ICD-10-CM | POA: Diagnosis not present

## 2017-02-18 DIAGNOSIS — Z888 Allergy status to other drugs, medicaments and biological substances status: Secondary | ICD-10-CM | POA: Diagnosis not present

## 2017-02-18 DIAGNOSIS — J9610 Chronic respiratory failure, unspecified whether with hypoxia or hypercapnia: Secondary | ICD-10-CM | POA: Diagnosis not present

## 2017-02-18 DIAGNOSIS — R51 Headache: Secondary | ICD-10-CM | POA: Diagnosis not present

## 2017-02-18 DIAGNOSIS — T4275XA Adverse effect of unspecified antiepileptic and sedative-hypnotic drugs, initial encounter: Secondary | ICD-10-CM | POA: Diagnosis not present

## 2017-02-18 DIAGNOSIS — Z794 Long term (current) use of insulin: Secondary | ICD-10-CM

## 2017-02-18 DIAGNOSIS — M549 Dorsalgia, unspecified: Secondary | ICD-10-CM | POA: Diagnosis present

## 2017-02-18 DIAGNOSIS — K5900 Constipation, unspecified: Secondary | ICD-10-CM | POA: Diagnosis not present

## 2017-02-18 DIAGNOSIS — E669 Obesity, unspecified: Secondary | ICD-10-CM | POA: Diagnosis not present

## 2017-02-18 LAB — CBC
HEMATOCRIT: 35.6 % — AB (ref 36.0–46.0)
HEMOGLOBIN: 11.5 g/dL — AB (ref 12.0–15.0)
MCH: 28.2 pg (ref 26.0–34.0)
MCHC: 32.3 g/dL (ref 30.0–36.0)
MCV: 87.3 fL (ref 78.0–100.0)
Platelets: 259 10*3/uL (ref 150–400)
RBC: 4.08 MIL/uL (ref 3.87–5.11)
RDW: 14.2 % (ref 11.5–15.5)
WBC: 11.1 10*3/uL — AB (ref 4.0–10.5)

## 2017-02-18 LAB — BASIC METABOLIC PANEL
ANION GAP: 9 (ref 5–15)
BUN: 15 mg/dL (ref 6–20)
CHLORIDE: 108 mmol/L (ref 101–111)
CO2: 21 mmol/L — AB (ref 22–32)
Calcium: 8.6 mg/dL — ABNORMAL LOW (ref 8.9–10.3)
Creatinine, Ser: 1 mg/dL (ref 0.44–1.00)
GFR calc non Af Amer: >60 mL/min (ref >60)
Glucose, Bld: 161 mg/dL — ABNORMAL HIGH (ref 65–99)
Potassium: 3 mmol/L — ABNORMAL LOW (ref 3.5–5.1)
SODIUM: 138 mmol/L (ref 135–145)

## 2017-02-18 LAB — I-STAT ARTERIAL BLOOD GAS, ED
ACID-BASE DEFICIT: 8 mmol/L — AB (ref 0.0–2.0)
BICARBONATE: 16.5 mmol/L — AB (ref 20.0–28.0)
O2 Saturation: 94 %
PH ART: 7.367 (ref 7.350–7.450)
PO2 ART: 71 mmHg — AB (ref 83.0–108.0)
TCO2: 17 mmol/L (ref 0–100)
pCO2 arterial: 28.7 mmHg — ABNORMAL LOW (ref 32.0–48.0)

## 2017-02-18 MED ORDER — IPRATROPIUM BROMIDE 0.02 % IN SOLN
0.5000 mg | Freq: Once | RESPIRATORY_TRACT | Status: AC
  Administered 2017-02-18: 0.5 mg via RESPIRATORY_TRACT

## 2017-02-18 MED ORDER — ALBUTEROL (5 MG/ML) CONTINUOUS INHALATION SOLN
20.0000 mg/h | INHALATION_SOLUTION | RESPIRATORY_TRACT | Status: DC
  Administered 2017-02-18: 20 mg/h via RESPIRATORY_TRACT

## 2017-02-18 MED ORDER — ALBUTEROL SULFATE (2.5 MG/3ML) 0.083% IN NEBU: 5.0000 mg | INHALATION_SOLUTION | Freq: Once | RESPIRATORY_TRACT | Status: DC

## 2017-02-18 MED ORDER — IPRATROPIUM-ALBUTEROL 0.5-2.5 (3) MG/3ML IN SOLN
3.0000 mL | Freq: Once | RESPIRATORY_TRACT | Status: AC
  Administered 2017-02-18: 3 mL via RESPIRATORY_TRACT

## 2017-02-18 MED ORDER — ALBUTEROL (5 MG/ML) CONTINUOUS INHALATION SOLN
5.0000 mg/h | INHALATION_SOLUTION | Freq: Once | RESPIRATORY_TRACT | Status: DC
  Filled 2017-02-18: qty 40

## 2017-02-18 MED ORDER — POTASSIUM CHLORIDE 20 MEQ/15ML (10%) PO SOLN
40.0000 meq | Freq: Once | ORAL | Status: AC
  Administered 2017-02-18: 40 meq via ORAL
  Filled 2017-02-18 (×2): qty 30

## 2017-02-18 MED ORDER — ALBUTEROL (5 MG/ML) CONTINUOUS INHALATION SOLN: 5.0000 mg/h | INHALATION_SOLUTION | RESPIRATORY_TRACT | Status: DC

## 2017-02-18 MED ORDER — ALBUTEROL (5 MG/ML) CONTINUOUS INHALATION SOLN
INHALATION_SOLUTION | RESPIRATORY_TRACT | Status: AC
  Filled 2017-02-18: qty 20

## 2017-02-18 MED ORDER — ACETAMINOPHEN 325 MG PO TABS
650.0000 mg | ORAL_TABLET | Freq: Once | ORAL | Status: AC
  Administered 2017-02-18: 650 mg via ORAL
  Filled 2017-02-18: qty 2

## 2017-02-18 MED ORDER — SODIUM CHLORIDE 0.9 % IV BOLUS (SEPSIS)
1000.0000 mL | Freq: Once | INTRAVENOUS | Status: AC
  Administered 2017-02-18: 1000 mL via INTRAVENOUS

## 2017-02-18 MED ORDER — IBUPROFEN 200 MG PO TABS
600.0000 mg | ORAL_TABLET | Freq: Once | ORAL | Status: AC
  Administered 2017-02-18: 600 mg via ORAL
  Filled 2017-02-18: qty 1

## 2017-02-18 MED ORDER — METHYLPREDNISOLONE SODIUM SUCC 125 MG IJ SOLR
125.0000 mg | Freq: Once | INTRAMUSCULAR | Status: AC
  Administered 2017-02-18: 125 mg via INTRAVENOUS
  Filled 2017-02-18: qty 2

## 2017-02-18 MED ORDER — IPRATROPIUM-ALBUTEROL 0.5-2.5 (3) MG/3ML IN SOLN
RESPIRATORY_TRACT | Status: AC
  Filled 2017-02-18: qty 3

## 2017-02-18 MED ORDER — IPRATROPIUM BROMIDE 0.02 % IN SOLN
RESPIRATORY_TRACT | Status: AC
  Filled 2017-02-18: qty 2.5

## 2017-02-18 MED ORDER — MAGNESIUM SULFATE 2 GM/50ML IV SOLN
2.0000 g | Freq: Once | INTRAVENOUS | Status: AC
  Administered 2017-02-18: 2 g via INTRAVENOUS
  Filled 2017-02-18: qty 50

## 2017-02-18 MED ORDER — ALBUTEROL SULFATE (2.5 MG/3ML) 0.083% IN NEBU
INHALATION_SOLUTION | RESPIRATORY_TRACT | Status: AC
  Administered 2017-02-18: 5 mg
  Filled 2017-02-18: qty 6

## 2017-02-18 MED ORDER — ALBUTEROL (5 MG/ML) CONTINUOUS INHALATION SOLN
20.0000 mg/h | INHALATION_SOLUTION | RESPIRATORY_TRACT | Status: DC
  Administered 2017-02-18: 20 mg/h via RESPIRATORY_TRACT
  Filled 2017-02-18: qty 20

## 2017-02-18 MED ORDER — ALBUTEROL SULFATE (2.5 MG/3ML) 0.083% IN NEBU
5.0000 mg | INHALATION_SOLUTION | Freq: Once | RESPIRATORY_TRACT | Status: AC
  Administered 2017-02-18: 5 mg via RESPIRATORY_TRACT

## 2017-02-18 NOTE — H&P (Signed)
[facility] Admission History and Physical Service Pager: [phone]  Patient name: [name] Medical record number: [id number] Date of birth: Apr 07, 1971 Age: 46 y.o. Gender: female  Primary Care Provider: Arnoldo Morale, MD Consultants: None Code Status: Full (per discussion on admission)  Chief Complaint: shortness of breath   Assessment and Plan: [name] is a 46 y.o. female presenting with dyspnea. PMH is significant for asthma, chronic back pain, hyperlipidemia, hypertension, diabetes, obstructive sleep apnea, depression.   Asthma exacerbation: Respiratory rate in the 20s with oxygen saturation at 100% on room air. BP 144/74 and tachycardic to 117. BMP showing low potassium of 3 and low bicarbonate of 21. CBC unremarkable. ABG showing pH of 7.36, PCO2 28.7, bicarbonate of 16.5. Chest x-ray normal. EKG showing normal sinus rhythm with a prolonged QTC of 559. Status post 2 rounds of continuous albuterol treatment, magnesium, Solu-Medrol, and 40 mEq of K [place] in the ED. Continues to be tachypneic with poor air movement and audible wheezing. Does not appear fluid overloaded, less likely CHF exacerbation. Discussed patient with CCM attending Dr. Ashok Cordia as there was concern for respiratory distress; he recommended another continuous albuterol treatment, 40 Solu-Medrol every 6, and then reevaluating.  -Admit to family medicine teaching service, Stepdown unit, attending Dr. Gwendlyn Deutscher -Vitals per floor protocol -Continuous albuterol therapy, transition to DuoNeb's every 4 hours if patient improves on this. If not, we will touch base with CCM again. May need ICU level care -40 Solu-Medrol every 6 hours -Resume home Advair -Monitor respiratory status closely -Supplemental oxygen as needed - Add BNP to r/o to CHF (CHF low on differential, no obvious fluid overload but difficult to examine fluid status due to body habitus) -AM BMP and  CBC  Left-sided chest pain: Less likely cardiac etiology, no ST changes seen on EKG. More likely MSK in origin given increased work of breathing. -Telemetry -Troponins -Continue home naproxen and Tylenol 3   Prolonged QTC : QTC of 559 EKG.  -Telemetry -Avoid QTC prolonging agents  Hypokalemia: Potassium 3. Status post 40 mEq acute or -A.m. BMP  Hypertension: Normotensive on admission -Continue home amlodipine 5 mg daily, hydrochlorothiazide 25 mg daily, losartan 100 mg daily  Hyperlipidemia -Continue simvastatin 10 mg daily  Diabetes: Most hemoglobin A1c 6.5 in February 2018. Follows with endocrinology -A1c -Hold home Farxiga, repaglinide, and trulicity - Continue home bromocriptine (unclear why pt is on this but think she is using it for off label T2DM) -Moderate SSI -CBGs with meals and qhs  Depression:  - Continue home Zoloft  Chronic back pain: Patient has lumbar disc herniation. She follows with [facility].  -Continue home Tylenol #3, gabapentin, Naproxen, Robaxin  Hx of migraines:  -Continue home Topamax  FEN/GI: SLIV, carb modified diet Prophylaxis: Lovenox  Disposition: Admit to SDU, attending Dr. Gwendlyn Deutscher  History of Present Illness:  [name] is a 46 y.o. female with PMH of asthma, HTN, DM, HLD, OSA presenting with shortness of breath since yesterday afternoon. She notes that before she has been hospitalized for her asthma "plenty of times". Her last exacerbation was a couple years ago. Denies ever being intubated. She notes that she went to the store to get her husband some cough medication, then she came back home, sat down, and started coughing and wheezing. She grabbed her nebulizer machine and thought that it would help but it didn't. Admits to some back pain and she was supposed to see a Psychologist, sport and exercise tomorrow. She has not had  a cold recently but her husband has a cough and runny nose at home for the last 2-3 days. Admits to a temp  of 100.1 last week but no other fevers. States she hasn't been taking her Advair but she has been using her PRN albuterol. Admits to some left pleuritic chest tightness that started yesterday. Not worse with movement. Has been smoking intermittently for the last 3 years. She states she smokes about 1/3 pack a day. She states she is supposed to use a CPAP machine but does not.   Review Of Systems: Per HPI with the following additions: see HPI  ROS  Patient Active Problem List   Diagnosis Date Noted  . Gastritis and gastroduodenitis 02/22/2016  . Hyperlipidemia 02/22/2016  . Depression 02/22/2016  . Migraine 01/30/2015  . Headache 01/30/2015  . URI (upper respiratory infection) 06/04/2014  . Atypical chest pain 02/26/2014  . DM type 2 (diabetes mellitus, type 2) ([place]) 02/26/2014  . Stable asthma 02/26/2014  . Chest pain 11/10/2013  . Hypertensive urgency 11/10/2013  . Back pain 09/05/2013  . Essential hypertension, benign 09/05/2013  . Acute bronchitis 05/01/2013  . Asthma exacerbation 04/27/2013  . Uncontrolled diabetes mellitus ([place]) 04/27/2013  . Obesity 04/27/2013  . Allergic rhinitis, seasonal 08/11/2012  . Chronic cough 08/11/2012  . Sleep apnea, obstructive 12/04/2011    Past Medical History: Past Medical History:  Diagnosis Date  . Arthritis   . Asthma   . Chronic back pain   . Chronic chest pain   . Depression   . DM (diabetes mellitus) ([place])    INSULIN DEPENDENT  . GERD (gastroesophageal reflux disease)   . Headache(784.0)   . Hypertension   . Hypokalemia     Past Surgical History: Past Surgical History:  Procedure Laterality Date  . APPENDECTOMY    . CESAREAN SECTION     x 3  . CHOLECYSTECTOMY N/A 03/02/2014   Procedure: LAPAROSCOPIC CHOLECYSTECTOMY;  Surgeon: Joyice Faster. Cornett, MD;  Location: [facility];  Service: General;  Laterality: N/A;  . HERNIA REPAIR    . ROTATOR CUFF REPAIR    . VESICOVAGINAL FISTULA CLOSURE W/ TAH  2009    Social  History: Social History  Substance Use Topics  . Smoking status: Current Every Day Smoker    Packs/day: 0.25    Years: 22.00    Types: Cigarettes  . Smokeless tobacco: Never Used  . Alcohol use 0.6 - 1.2 oz/week    1 - 2 Glasses of wine per week     Comment: socially   Please also refer to relevant sections of EMR.  Family History: Family History  Problem Relation Age of Onset  . Heart attack Mother   . Stroke Mother   . Diabetes Mother   . Hypertension Mother   . Arthritis Mother   . Stroke Father   . Hypertension Sister   . Diabetes Sister   . Seizures Brother   . Diabetes Brother     Allergies and Medications: Allergies  Allergen Reactions  . Reglan [Metoclopramide] Other (See Comments)    Panic attack   No current facility-administered medications on file prior to encounter.    Current Outpatient Prescriptions on File Prior to Encounter  Medication Sig Dispense Refill  . acetaminophen-codeine (TYLENOL #3) 300-30 MG tablet Take 1 tablet by mouth every 12 (twelve) hours as needed for moderate pain. 30 tablet 0  . albuterol (PROVENTIL HFA;VENTOLIN HFA) 108 (90 Base) MCG/ACT inhaler Inhale 2 puffs into the lungs every 6 (  six) hours as needed for wheezing or shortness of breath. Reported on 02/22/2016 1 each 3  . albuterol (PROVENTIL) (2.5 MG/3ML) 0.083% nebulizer solution Take 3 mLs (2.5 mg total) by nebulization every 6 (six) hours as needed for wheezing or shortness of breath. Reported on 02/22/2016 75 mL 3  . amLODipine (NORVASC) 5 MG tablet Take 1 tablet (5 mg total) by mouth daily. 90 tablet 1  . aspirin EC 325 MG tablet Take 325 mg by mouth daily.    . Blood Glucose Monitoring Suppl (ACCU-CHEK AVIVA PLUS) w/Device KIT Use to check blood sugar 3 times per day. DX CODE: E11.9 1 kit 2  . bromocriptine (PARLODEL) 2.5 MG tablet 1/4 tab daily 20 tablet 2  . dapagliflozin propanediol (FARXIGA) 10 MG TABS tablet Take 10 mg by mouth daily. 30 tablet 11  . Dulaglutide  (TRULICITY) 1.5 NL/8.9QJ SOPN Inject 1.5 mg into the skin once a week. 4 pen 11  . Fluticasone-Salmeterol (ADVAIR) 250-50 MCG/DOSE AEPB Inhale 1 puff into the lungs 2 times daily at 12 noon and 4 pm. 60 each 3  . gabapentin (NEURONTIN) 300 MG capsule Take 1 capsule (300 mg total) by mouth 3 (three) times daily. 90 capsule 3  . glucose blood (ACCU-CHEK AVIVA PLUS) test strip Use to check blood sugar 3 times per day.DX Code E11.9 100 each 12  . hydrochlorothiazide (HYDRODIURIL) 25 MG tablet Take 1 tablet (25 mg total) by mouth daily. 30 tablet 4  . Insulin Pen Needle 32G X 4 MM MISC Use to inject insulin 3 times per day. 100 each 2  . Lancet Devices (ACCU-CHEK SOFTCLIX) lancets Use to check blood sugar 3 times per day dx code E11.9 100 each 2  . Lancets (ACCU-CHEK MULTICLIX) lancets Use to check blood sugar 3 times per day. DX code E11.9 100 each 12  . losartan (COZAAR) 100 MG tablet Take 1 tablet (100 mg total) by mouth daily. For Blood pressure - must have office visit for refills 30 tablet 3  . methocarbamol (ROBAXIN) 500 MG tablet Take 1 tablet (500 mg total) by mouth every 8 (eight) hours as needed for muscle spasms. 90 tablet 1  . naproxen (NAPROSYN) 500 MG tablet Take 1 tablet (500 mg total) by mouth 2 (two) times daily with a meal. 60 tablet 1  . olopatadine (PATANOL) 0.1 % ophthalmic solution Place 1 drop into the right eye 2 (two) times daily. 5 mL 0  . oxyCODONE-acetaminophen (PERCOCET) 5-325 MG tablet Take 1-2 tablets by mouth every 4 (four) hours as needed. 20 tablet 0  . pantoprazole (PROTONIX) 40 MG tablet Take 1 tablet (40 mg total) by mouth daily. 30 tablet 3  . prochlorperazine (COMPAZINE) 10 MG tablet Take 1 tablet (10 mg total) by mouth 2 (two) times daily as needed for nausea or vomiting. 10 tablet 0  . repaglinide (PRANDIN) 2 MG tablet 1 tab with breakfast and lunch, and 2 tabs with dinner. 120 tablet 11  . sertraline (ZOLOFT) 50 MG tablet Take 1 tablet (50 mg total) by mouth  daily. 30 tablet 3  . simvastatin (ZOCOR) 10 MG tablet Take 1 tablet (10 mg total) by mouth daily at 6 PM. 30 tablet 3  . SUMAtriptan (IMITREX) 100 MG tablet TAKE 1TAB AT EARLIEST ONSET OF HEADACHE. MAY REPEAT X1 IN 2 HOURS IF HEADACHE PERSISTS OR RECURS. DO NOT EXCEED 2 TABS IN 24HRS 9 tablet 2  . topiramate (TOPAMAX) 100 MG tablet Take 1 tablet (100 mg total) by mouth 2 (  two) times daily. 60 tablet 3  . [DISCONTINUED] promethazine (PHENERGAN) 25 MG tablet Take 1 tablet (25 mg total) by mouth every 6 (six) hours as needed for nausea. (Patient not taking: Reported on 12/13/2014) 20 tablet 0    Objective: BP (!) 148/79   Pulse (!) 121   Resp (!) 22   SpO2 100%  Exam: General: Alert, obese, in distress sitting up in bed with breathing treatment mask on Eyes: Pupils equal and reactive to light, nonicteric sclera ENTM: No nasal discharge no oropharyngeal lesions or erythema, no lymphadenopathy, moist mucous membranes Neck: Supple Cardiovascular: Tachycardic rate, regular rhythm, normal S1-S2, normal cap refill, trace edema in bilateral hands and feet Respiratory: Increased work of breathing with accessory muscle use, decreased air movement in all lung fields, audible wheezing even without auscultation, no crackles appreciated  Gastrointestinal: Soft, obese abdomen, nondistended, nontender, normal bowel sounds  MSK: Moves all extremities spontaneously  Derm: No rash  Neuro: Alert and oriented but in distress, 5 out of 5 grip Shanks bilaterally, 5 out of 5 strength in bilateral lower extremities, sensation grossly intact throughout  Psych: Normal mood and affect   Labs and Imaging: CBC BMET   Recent Labs Lab 02/18/17 1648  WBC 11.1*  HGB 11.5*  HCT 35.6*  PLT 259    Recent Labs Lab 02/18/17 1648  NA 138  K 3.0*  CL 108  CO2 21*  BUN 15  CREATININE 1.00  GLUCOSE 161*  CALCIUM 8.6*     Dg Chest Port 1 View  Result Date: 02/18/2017 CLINICAL DATA:  Shortness of breath and  cough for 2 days EXAM: PORTABLE CHEST 1 VIEW COMPARISON:  08/22/2015 FINDINGS: The heart size and mediastinal contours are within normal limits. Both lungs are clear. The visualized skeletal structures are unremarkable. IMPRESSION: No active disease. Electronically Signed   By: Inez Catalina M.D.   On: 02/18/2017 16:36     Carlyle Dolly, MD 02/18/2017, 6:26 PM PGY-2, [facility] pager: [phone], text pages welcome

## 2017-02-18 NOTE — ED Provider Notes (Addendum)
Pt seen and evaluated. D/W Dr. Lanetta Inch. Patient with history of asthma. Admitted before. States her symptoms flared yesterday. Is using albuterol "all night". As a continuous subdural here over 45 minutes. Continues with marked prolongation of expiratory phase. Not hypoxemic but increased work of breathing. Will require admission. Has been given IV steroids. No abnormalities on chest x-ray. Afebrile.  Pt seen in ED by Family Medicine team.   Tanna Furry, MD 02/18/17 1800    Tanna Furry, MD 02/19/17 [phone]

## 2017-02-18 NOTE — ED Provider Notes (Signed)
[facility] DEPT Provider Note   CSN: [id number] Arrival date & time: 02/18/17  1501     History   Chief Complaint Chief Complaint  Patient presents with  . Asthma    [name] is a 46 y.o. female.  The history is provided by the patient.  Shortness of Breath  This is a recurrent problem. The average episode lasts 2 days. The problem occurs intermittently.The current episode started 2 days ago. The problem has been gradually worsening. Associated symptoms include cough, wheezing and chest pain (describes a tightness all around her chest and back 2/2 her wheezing). Pertinent negatives include no fever, no sputum production, no syncope, no vomiting, no abdominal pain and no leg swelling. Precipitated by: husband with URI like symptoms over the past week. Treatments tried: home inhalers. The treatment provided no relief. She has had prior hospitalizations. Associated medical issues include asthma.    Past Medical History:  Diagnosis Date  . Arthritis   . Asthma   . Chronic back pain   . Chronic chest pain   . Depression   . DM (diabetes mellitus) ([place])    INSULIN DEPENDENT  . GERD (gastroesophageal reflux disease)   . Headache(784.0)   . Hypertension   . Hypokalemia     Patient Active Problem List   Diagnosis Date Noted  . Gastritis and gastroduodenitis 02/22/2016  . Hyperlipidemia 02/22/2016  . Depression 02/22/2016  . Migraine 01/30/2015  . Headache 01/30/2015  . URI (upper respiratory infection) 06/04/2014  . Atypical chest pain 02/26/2014  . DM type 2 (diabetes mellitus, type 2) ([place]) 02/26/2014  . Stable asthma 02/26/2014  . Chest pain 11/10/2013  . Hypertensive urgency 11/10/2013  . Back pain 09/05/2013  . Essential hypertension, benign 09/05/2013  . Acute bronchitis 05/01/2013  . Asthma exacerbation 04/27/2013  . Uncontrolled diabetes mellitus ([place]) 04/27/2013  . Obesity 04/27/2013  . Allergic rhinitis, seasonal 08/11/2012  .  Chronic cough 08/11/2012  . Sleep apnea, obstructive 12/04/2011    Past Surgical History:  Procedure Laterality Date  . APPENDECTOMY    . CESAREAN SECTION     x 3  . CHOLECYSTECTOMY N/A 03/02/2014   Procedure: LAPAROSCOPIC CHOLECYSTECTOMY;  Surgeon: Joyice Faster. Cornett, MD;  Location: [facility];  Service: General;  Laterality: N/A;  . HERNIA REPAIR    . ROTATOR CUFF REPAIR    . VESICOVAGINAL FISTULA CLOSURE W/ TAH  2009    OB History    No data available       Home Medications    Prior to Admission medications   Medication Sig Start Date End Date Taking? Authorizing Provider  acetaminophen-codeine (TYLENOL #3) 300-30 MG tablet Take 1 tablet by mouth every 12 (twelve) hours as needed for moderate pain. 09/08/16   Arnoldo Morale, MD  albuterol (PROVENTIL HFA;VENTOLIN HFA) 108 (90 Base) MCG/ACT inhaler Inhale 2 puffs into the lungs every 6 (six) hours as needed for wheezing or shortness of breath. Reported on 02/22/2016 09/08/16   Arnoldo Morale, MD  albuterol (PROVENTIL) (2.5 MG/3ML) 0.083% nebulizer solution Take 3 mLs (2.5 mg total) by nebulization every 6 (six) hours as needed for wheezing or shortness of breath. Reported on 02/22/2016 09/08/16   Arnoldo Morale, MD  amLODipine (NORVASC) 5 MG tablet Take 1 tablet (5 mg total) by mouth daily. 01/05/17   Clent Demark, PA-C  aspirin EC 325 MG tablet Take 325 mg by mouth daily.    [provider]  Blood Glucose Monitoring Suppl (ACCU-CHEK AVIVA PLUS) w/Device  KIT Use to check blood sugar 3 times per day. DX CODE: E11.9 05/21/16   Renato Shin, MD  bromocriptine (PARLODEL) 2.5 MG tablet 1/4 tab daily 08/06/16   Renato Shin, MD  dapagliflozin propanediol (FARXIGA) 10 MG TABS tablet Take 10 mg by mouth daily. 01/25/16   Renato Shin, MD  Dulaglutide (TRULICITY) 1.5 FH/5.4TG SOPN Inject 1.5 mg into the skin once a week. 01/26/17   Renato Shin, MD  Fluticasone-Salmeterol (ADVAIR) 250-50 MCG/DOSE AEPB Inhale 1 puff into the lungs 2 times  daily at 12 noon and 4 pm. 09/08/16   Arnoldo Morale, MD  gabapentin (NEURONTIN) 300 MG capsule Take 1 capsule (300 mg total) by mouth 3 (three) times daily. 09/08/16   Arnoldo Morale, MD  glucose blood (ACCU-CHEK AVIVA PLUS) test strip Use to check blood sugar 3 times per day.DX Code E11.9 01/26/17   Renato Shin, MD  hydrochlorothiazide (HYDRODIURIL) 25 MG tablet Take 1 tablet (25 mg total) by mouth daily. 09/08/16   Arnoldo Morale, MD  Insulin Pen Needle 32G X 4 MM MISC Use to inject insulin 3 times per day. 10/29/15   Renato Shin, MD  Lancet Devices [facility]) lancets Use to check blood sugar 3 times per day dx code E11.9 09/18/15   Elayne Snare, MD  Lancets (ACCU-CHEK MULTICLIX) lancets Use to check blood sugar 3 times per day. DX code E11.9 08/24/15   Renato Shin, MD  losartan (COZAAR) 100 MG tablet Take 1 tablet (100 mg total) by mouth daily. For Blood pressure - must have office visit for refills 09/08/16   Arnoldo Morale, MD  methocarbamol (ROBAXIN) 500 MG tablet Take 1 tablet (500 mg total) by mouth every 8 (eight) hours as needed for muscle spasms. 09/08/16   Arnoldo Morale, MD  naproxen (NAPROSYN) 500 MG tablet Take 1 tablet (500 mg total) by mouth 2 (two) times daily with a meal. 09/08/16   Arnoldo Morale, MD  olopatadine (PATANOL) 0.1 % ophthalmic solution Place 1 drop into the right eye 2 (two) times daily. 09/29/16   Arnoldo Morale, MD  oxyCODONE-acetaminophen (PERCOCET) 5-325 MG tablet Take 1-2 tablets by mouth every 4 (four) hours as needed. 06/23/16   Harris, Abigail, PA-C  pantoprazole (PROTONIX) 40 MG tablet Take 1 tablet (40 mg total) by mouth daily. 09/08/16   Arnoldo Morale, MD  prochlorperazine (COMPAZINE) 10 MG tablet Take 1 tablet (10 mg total) by mouth 2 (two) times daily as needed for nausea or vomiting. 08/21/16   Carlisle Cater, PA-C  repaglinide (PRANDIN) 2 MG tablet 1 tab with breakfast and lunch, and 2 tabs with dinner. 05/27/16   Renato Shin, MD  sertraline (ZOLOFT) 50  MG tablet Take 1 tablet (50 mg total) by mouth daily. 09/08/16   Arnoldo Morale, MD  simvastatin (ZOCOR) 10 MG tablet Take 1 tablet (10 mg total) by mouth daily at 6 PM. 09/08/16   Arnoldo Morale, MD  SUMAtriptan (IMITREX) 100 MG tablet TAKE 1TAB AT EARLIEST ONSET OF HEADACHE. MAY REPEAT X1 IN 2 HOURS IF HEADACHE PERSISTS OR RECURS. DO NOT EXCEED 2 TABS IN 24HRS 09/08/16   Arnoldo Morale, MD  topiramate (TOPAMAX) 100 MG tablet Take 1 tablet (100 mg total) by mouth 2 (two) times daily. 09/08/16   Arnoldo Morale, MD    Family History Family History  Problem Relation Age of Onset  . Heart attack Mother   . Stroke Mother   . Diabetes Mother   . Hypertension Mother   . Arthritis Mother   . Stroke  Father   . Hypertension Sister   . Diabetes Sister   . Seizures Brother   . Diabetes Brother     Social History Social History  Substance Use Topics  . Smoking status: Current Every Day Smoker    Packs/day: 0.25    Years: 22.00    Types: Cigarettes  . Smokeless tobacco: Never Used  . Alcohol use 0.6 - 1.2 oz/week    1 - 2 Glasses of wine per week     Comment: socially     Allergies   Reglan [metoclopramide]   Review of Systems Review of Systems  Constitutional: Negative for fever.  HENT: Negative.   Respiratory: Positive for cough, shortness of breath and wheezing. Negative for sputum production.   Cardiovascular: Positive for chest pain (describes a tightness all around her chest and back 2/2 her wheezing). Negative for leg swelling and syncope.  Gastrointestinal: Negative for abdominal pain, diarrhea, nausea and vomiting.  Genitourinary: Negative.   Neurological: Negative.   All other systems reviewed and are negative.    Physical Exam Updated Vital Signs BP (!) 161/91 (BP Location: Right Arm)   Pulse 94   Resp (!) 24   SpO2 99%  ED Triage Vitals [02/18/17 1507]  Enc Vitals Group     BP (!) 161/91     Pulse Rate 94     Resp (!) 24     Temp      Temp src      SpO2 99  %     Weight      Height      Head Circumference      Peak Flow      Pain Score 8     Pain Loc      Pain Edu?      Excl. in [place]?     Physical Exam  Constitutional: She is oriented to person, place, and time. She appears well-developed and well-nourished.  HENT:  Head: Normocephalic and atraumatic.  Mouth/Throat: Oropharynx is clear and moist.  Eyes: Conjunctivae are normal. Pupils are equal, round, and reactive to light.  Neck: Normal range of motion. Neck supple.  Cardiovascular: Regular rhythm, normal heart sounds and intact distal pulses.   No murmur heard. Slight tachycardia  Pulmonary/Chest: Accessory muscle usage present. Tachypnea noted. She has decreased breath sounds. She has wheezes. She has rhonchi.  Abdominal: Soft. There is no tenderness.  Musculoskeletal: She exhibits no edema or deformity.  Neurological: She is alert and oriented to person, place, and time. No cranial nerve deficit. She exhibits normal muscle tone. Coordination normal.  Skin: Skin is warm and dry.  Psychiatric: She has a normal mood and affect.  Nursing note and vitals reviewed.    ED Treatments / Results  Labs (all labs ordered are listed, but only abnormal results are displayed) Labs Reviewed - No data to display  EKG  EKG Interpretation None       Radiology No results found.  Procedures Procedures (including critical care time)  Medications Ordered in ED Medications  ipratropium-albuterol (DUONEB) 0.5-2.5 (3) MG/3ML nebulizer solution (not administered)  albuterol (PROVENTIL) (2.5 MG/3ML) 0.083% nebulizer solution (not administered)  ipratropium-albuterol (DUONEB) 0.5-2.5 (3) MG/3ML nebulizer solution 3 mL (3 mLs Nebulization Given 02/18/17 1510)  albuterol (PROVENTIL) (2.5 MG/3ML) 0.083% nebulizer solution 5 mg (5 mg Nebulization Given 02/18/17 1520)     Initial Impression / Assessment and Plan / ED Course  I have reviewed the triage vital signs and the nursing  notes.  Pertinent labs & imaging results that were available during my care of the patient were reviewed by me and considered in my medical decision making (see chart for details).     Patient is a 46 year old female with a history of asthma and diabetes who presents with worsening shortness of breath, cough, wheezing despite her home breathing treatments since yesterday. Upon arrival vital signs are reassuring but she does have diffuse wheezing and decreased breath sounds with increased respiratory effort. No fevers or productive cough. At this time she'll be started on continuous albuterol with Atrovent, Macrobid, steroids. xr without pulmonary infection and lab work with just mild leukocytosis. On reassessment pt breath sounds improving but she still complains of sob and cough. Will give another round of continuous and she will likely need admission to stu for further management and evaluation. Blood gas obtained without obvious retention at this time but ph is on the lower side at 7.36. Will monitor closely. Pt to be admitted to stu with family medicine.  Final Clinical Impressions(s) / ED Diagnoses   Final diagnoses:  None    New Prescriptions New Prescriptions   No medications on file     Heriberto Antigua, MD 02/19/17 [place]    Tanna Furry, MD 02/19/17 [phone]

## 2017-02-18 NOTE — Progress Notes (Signed)
FPTS Interim Progress Note  S: Went back down to the ED after initial admission visit to assess patient after second CAT. Patient unchanged from previous. She is not feeling better at all. Is able to speak in 3-4 word sentences still. No O2 requirement.   O: BP (!) 162/88   Pulse (!) 114   Resp 16   SpO2 99%   General: Alert, in respiratory distress, tired Card: Tachycardic, regular rhythm Pulm: Increased work of breathing, tachypneic, slightly improved air movement, continued audible wheezing  A/P: Asthma Exacerbation: See H&P for more detailed A/P. Will order 3rd CAT to be performed in ED and see how patient does. She is not stable for transfer to SDU yet with the level of respiratory distress she is in. Discussed with SWOT RN and ED RN. Will reassess after 3rd treatment.   Carlyle Dolly, MD 02/18/2017, 11:12 PM PGY-2, [facility] Medicine Service pager [phone]

## 2017-02-18 NOTE — ED Triage Notes (Signed)
Pt sts asthma sx with cough; pt distressed at present with little air movement and wheezing; pt sts done 5 treatments at home without relief

## 2017-02-18 NOTE — ED Notes (Signed)
Family practice at bedside.

## 2017-02-19 ENCOUNTER — Inpatient Hospital Stay (HOSPITAL_COMMUNITY): Payer: Medicare Other

## 2017-02-19 DIAGNOSIS — J9601 Acute respiratory failure with hypoxia: Secondary | ICD-10-CM

## 2017-02-19 DIAGNOSIS — J4551 Severe persistent asthma with (acute) exacerbation: Principal | ICD-10-CM

## 2017-02-19 DIAGNOSIS — E872 Acidosis: Secondary | ICD-10-CM

## 2017-02-19 DIAGNOSIS — J45901 Unspecified asthma with (acute) exacerbation: Secondary | ICD-10-CM

## 2017-02-19 DIAGNOSIS — N179 Acute kidney failure, unspecified: Secondary | ICD-10-CM

## 2017-02-19 DIAGNOSIS — I10 Essential (primary) hypertension: Secondary | ICD-10-CM

## 2017-02-19 LAB — POCT I-STAT 3, ART BLOOD GAS (G3+)
ACID-BASE DEFICIT: 16 mmol/L — AB (ref 0.0–2.0)
ACID-BASE DEFICIT: 9 mmol/L — AB (ref 0.0–2.0)
ACID-BASE DEFICIT: 9 mmol/L — AB (ref 0.0–2.0)
BICARBONATE: 14 mmol/L — AB (ref 20.0–28.0)
Bicarbonate: 19.2 mmol/L — ABNORMAL LOW (ref 20.0–28.0)
Bicarbonate: 20.4 mmol/L (ref 20.0–28.0)
O2 SAT: 99 %
O2 SAT: 97 %
O2 Saturation: 98 %
PCO2 ART: 52.9 mmHg — AB (ref 32.0–48.0)
PCO2 ART: 60.2 mmHg — AB (ref 32.0–48.0)
PO2 ART: 155 mmHg — AB (ref 83.0–108.0)
PO2 ART: 116 mmHg — AB (ref 83.0–108.0)
Patient temperature: 98.7
TCO2: 16 mmol/L (ref 0–100)
TCO2: 21 mmol/L (ref 0–100)
TCO2: 22 mmol/L (ref 0–100)
pCO2 arterial: 48.9 mmHg — ABNORMAL HIGH (ref 32.0–48.0)
pH, Arterial: 7.065 — CL (ref 7.350–7.450)
pH, Arterial: 7.169 — CL (ref 7.350–7.450)
pH, Arterial: 7.139 — CL (ref 7.350–7.450)
pO2, Arterial: 159 mmHg — ABNORMAL HIGH (ref 83.0–108.0)

## 2017-02-19 LAB — BASIC METABOLIC PANEL
ANION GAP: 9 (ref 5–15)
Anion gap: 13 (ref 5–15)
Anion gap: 7 (ref 5–15)
BUN: 14 mg/dL (ref 6–20)
BUN: 16 mg/dL (ref 6–20)
BUN: 13 mg/dL (ref 6–20)
CALCIUM: 7.9 mg/dL — AB (ref 8.9–10.3)
CALCIUM: 7.6 mg/dL — AB (ref 8.9–10.3)
CHLORIDE: 103 mmol/L (ref 101–111)
CO2: 21 mmol/L — ABNORMAL LOW (ref 22–32)
CO2: 18 mmol/L — AB (ref 22–32)
CO2: 25 mmol/L (ref 22–32)
CREATININE: 1.43 mg/dL — AB (ref 0.44–1.00)
Calcium: 7.8 mg/dL — ABNORMAL LOW (ref 8.9–10.3)
Chloride: 109 mmol/L (ref 101–111)
Chloride: 104 mmol/L (ref 101–111)
Creatinine, Ser: 1.73 mg/dL — ABNORMAL HIGH (ref 0.44–1.00)
Creatinine, Ser: 2.16 mg/dL — ABNORMAL HIGH (ref 0.44–1.00)
GFR calc Af Amer: 50 mL/min — ABNORMAL LOW (ref >60)
GFR, EST AFRICAN AMERICAN: 39 mL/min — AB (ref >60)
GFR, EST AFRICAN AMERICAN: 30 mL/min — AB (ref >60)
GFR, EST NON AFRICAN AMERICAN: 34 mL/min — AB (ref >60)
GFR, EST NON AFRICAN AMERICAN: 26 mL/min — AB (ref >60)
GFR, EST NON AFRICAN AMERICAN: 43 mL/min — AB (ref >60)
GLUCOSE: 221 mg/dL — AB (ref 65–99)
Glucose, Bld: 375 mg/dL — ABNORMAL HIGH (ref 65–99)
Glucose, Bld: 237 mg/dL — ABNORMAL HIGH (ref 65–99)
POTASSIUM: 7.5 mmol/L — AB (ref 3.5–5.1)
POTASSIUM: 6.8 mmol/L — AB (ref 3.5–5.1)
POTASSIUM: 3.3 mmol/L — AB (ref 3.5–5.1)
SODIUM: 137 mmol/L (ref 135–145)
SODIUM: 136 mmol/L (ref 135–145)
Sodium: 136 mmol/L (ref 135–145)

## 2017-02-19 LAB — URINALYSIS, ROUTINE W REFLEX MICROSCOPIC
BILIRUBIN URINE: NEGATIVE
Glucose, UA: 50 mg/dL — AB
KETONES UR: NEGATIVE mg/dL
Nitrite: NEGATIVE
Protein, ur: 100 mg/dL — AB
Specific Gravity, Urine: 1.016 (ref 1.005–1.030)
pH: 5 (ref 5.0–8.0)

## 2017-02-19 LAB — GLUCOSE, CAPILLARY
GLUCOSE-CAPILLARY: 384 mg/dL — AB (ref 65–99)
GLUCOSE-CAPILLARY: 349 mg/dL — AB (ref 65–99)
GLUCOSE-CAPILLARY: 315 mg/dL — AB (ref 65–99)
GLUCOSE-CAPILLARY: 240 mg/dL — AB (ref 65–99)
GLUCOSE-CAPILLARY: 236 mg/dL — AB (ref 65–99)
GLUCOSE-CAPILLARY: 179 mg/dL — AB (ref 65–99)
GLUCOSE-CAPILLARY: 167 mg/dL — AB (ref 65–99)
GLUCOSE-CAPILLARY: 174 mg/dL — AB (ref 65–99)
GLUCOSE-CAPILLARY: 214 mg/dL — AB (ref 65–99)
GLUCOSE-CAPILLARY: 205 mg/dL — AB (ref 65–99)
GLUCOSE-CAPILLARY: 217 mg/dL — AB (ref 65–99)
Glucose-Capillary: 162 mg/dL — ABNORMAL HIGH (ref 65–99)
Glucose-Capillary: 175 mg/dL — ABNORMAL HIGH (ref 65–99)
Glucose-Capillary: 198 mg/dL — ABNORMAL HIGH (ref 65–99)
Glucose-Capillary: 206 mg/dL — ABNORMAL HIGH (ref 65–99)
Glucose-Capillary: 219 mg/dL — ABNORMAL HIGH (ref 65–99)
Glucose-Capillary: 228 mg/dL — ABNORMAL HIGH (ref 65–99)
Glucose-Capillary: 358 mg/dL — ABNORMAL HIGH (ref 65–99)
Glucose-Capillary: 388 mg/dL — ABNORMAL HIGH (ref 65–99)

## 2017-02-19 LAB — RENAL FUNCTION PANEL
ANION GAP: 10 (ref 5–15)
Albumin: 3.2 g/dL — ABNORMAL LOW (ref 3.5–5.0)
BUN: 17 mg/dL (ref 6–20)
CHLORIDE: 109 mmol/L (ref 101–111)
CO2: 21 mmol/L — ABNORMAL LOW (ref 22–32)
Calcium: 7.9 mg/dL — ABNORMAL LOW (ref 8.9–10.3)
Creatinine, Ser: 2.04 mg/dL — ABNORMAL HIGH (ref 0.44–1.00)
GFR, EST AFRICAN AMERICAN: 32 mL/min — AB (ref >60)
GFR, EST NON AFRICAN AMERICAN: 28 mL/min — AB (ref >60)
Glucose, Bld: 172 mg/dL — ABNORMAL HIGH (ref 65–99)
POTASSIUM: 4.2 mmol/L (ref 3.5–5.1)
Phosphorus: 4.1 mg/dL (ref 2.5–4.6)
Sodium: 140 mmol/L (ref 135–145)

## 2017-02-19 LAB — RESPIRATORY PANEL BY PCR
ADENOVIRUS-RVPPCR: NOT DETECTED
BORDETELLA PERTUSSIS-RVPCR: NOT DETECTED
CHLAMYDOPHILA PNEUMONIAE-RVPPCR: NOT DETECTED
CORONAVIRUS 229E-RVPPCR: NOT DETECTED
CORONAVIRUS HKU1-RVPPCR: NOT DETECTED
CORONAVIRUS NL63-RVPPCR: NOT DETECTED
Coronavirus OC43: NOT DETECTED
Influenza A: NOT DETECTED
Influenza B: NOT DETECTED
Metapneumovirus: NOT DETECTED
Mycoplasma pneumoniae: NOT DETECTED
PARAINFLUENZA VIRUS 3-RVPPCR: NOT DETECTED
Parainfluenza Virus 1: NOT DETECTED
Parainfluenza Virus 2: NOT DETECTED
Parainfluenza Virus 4: NOT DETECTED
Respiratory Syncytial Virus: NOT DETECTED
Rhinovirus / Enterovirus: DETECTED — AB

## 2017-02-19 LAB — I-STAT ARTERIAL BLOOD GAS, ED
Acid-base deficit: 15 mmol/L — ABNORMAL HIGH (ref 0.0–2.0)
Acid-base deficit: 16 mmol/L — ABNORMAL HIGH (ref 0.0–2.0)
Bicarbonate: 10.5 mmol/L — ABNORMAL LOW (ref 20.0–28.0)
Bicarbonate: 13.6 mmol/L — ABNORMAL LOW (ref 20.0–28.0)
O2 SAT: 97 %
O2 Saturation: 99 %
PCO2 ART: 23.1 mmHg — AB (ref 32.0–48.0)
PCO2 ART: 44.5 mmHg (ref 32.0–48.0)
PH ART: 7.266 — AB (ref 7.350–7.450)
TCO2: 11 mmol/L (ref 0–100)
TCO2: 15 mmol/L (ref 0–100)
pH, Arterial: 7.093 — CL (ref 7.350–7.450)
pO2, Arterial: 181 mmHg — ABNORMAL HIGH (ref 83.0–108.0)
pO2, Arterial: 99 mmHg (ref 83.0–108.0)

## 2017-02-19 LAB — POCT I-STAT, CHEM 8
BUN: 13 mg/dL (ref 6–20)
BUN: 17 mg/dL (ref 6–20)
BUN: 17 mg/dL (ref 6–20)
BUN: 18 mg/dL (ref 6–20)
CALCIUM ION: 1.07 mmol/L — AB (ref 1.15–1.40)
CREATININE: 1.3 mg/dL — AB (ref 0.44–1.00)
Calcium, Ion: 0.54 mmol/L — CL (ref 1.15–1.40)
Calcium, Ion: 1.04 mmol/L — ABNORMAL LOW (ref 1.15–1.40)
Calcium, Ion: 0.61 mmol/L — CL (ref 1.15–1.40)
Chloride: 102 mmol/L (ref 101–111)
Chloride: 95 mmol/L — ABNORMAL LOW (ref 101–111)
Chloride: 100 mmol/L — ABNORMAL LOW (ref 101–111)
Chloride: 95 mmol/L — ABNORMAL LOW (ref 101–111)
Creatinine, Ser: 0.9 mg/dL (ref 0.44–1.00)
Creatinine, Ser: 1.5 mg/dL — ABNORMAL HIGH (ref 0.44–1.00)
Creatinine, Ser: 1.2 mg/dL — ABNORMAL HIGH (ref 0.44–1.00)
GLUCOSE: 210 mg/dL — AB (ref 65–99)
GLUCOSE: 240 mg/dL — AB (ref 65–99)
Glucose, Bld: 190 mg/dL — ABNORMAL HIGH (ref 65–99)
Glucose, Bld: 239 mg/dL — ABNORMAL HIGH (ref 65–99)
HCT: 29 % — ABNORMAL LOW (ref 36.0–46.0)
HCT: 31 % — ABNORMAL LOW (ref 36.0–46.0)
HEMATOCRIT: 29 % — AB (ref 36.0–46.0)
HEMATOCRIT: 35 % — AB (ref 36.0–46.0)
HEMOGLOBIN: 11.9 g/dL — AB (ref 12.0–15.0)
HEMOGLOBIN: 9.9 g/dL — AB (ref 12.0–15.0)
Hemoglobin: 10.5 g/dL — ABNORMAL LOW (ref 12.0–15.0)
Hemoglobin: 9.9 g/dL — ABNORMAL LOW (ref 12.0–15.0)
POTASSIUM: 2.4 mmol/L — AB (ref 3.5–5.1)
Potassium: 3.5 mmol/L (ref 3.5–5.1)
Potassium: 3.2 mmol/L — ABNORMAL LOW (ref 3.5–5.1)
Potassium: 3.4 mmol/L — ABNORMAL LOW (ref 3.5–5.1)
SODIUM: 140 mmol/L (ref 135–145)
SODIUM: 142 mmol/L (ref 135–145)
Sodium: 140 mmol/L (ref 135–145)
Sodium: 141 mmol/L (ref 135–145)
TCO2: 24 mmol/L (ref 0–100)
TCO2: 26 mmol/L (ref 0–100)
TCO2: 26 mmol/L (ref 0–100)
TCO2: 28 mmol/L (ref 0–100)

## 2017-02-19 LAB — CBC
HEMATOCRIT: 33.9 % — AB (ref 36.0–46.0)
HEMOGLOBIN: 10.8 g/dL — AB (ref 12.0–15.0)
MCH: 28.3 pg (ref 26.0–34.0)
MCHC: 31.9 g/dL (ref 30.0–36.0)
MCV: 88.7 fL (ref 78.0–100.0)
Platelets: 275 10*3/uL (ref 150–400)
RBC: 3.82 MIL/uL — ABNORMAL LOW (ref 3.87–5.11)
RDW: 14.7 % (ref 11.5–15.5)
WBC: 13.1 10*3/uL — AB (ref 4.0–10.5)

## 2017-02-19 LAB — CBG MONITORING, ED: GLUCOSE-CAPILLARY: 362 mg/dL — AB (ref 65–99)

## 2017-02-19 LAB — PHOSPHORUS: PHOSPHORUS: 4.6 mg/dL (ref 2.5–4.6)

## 2017-02-19 LAB — TROPONIN I
Troponin I: <0.03 ng/mL (ref <0.03)
Troponin I: 0.11 ng/mL (ref <0.03)

## 2017-02-19 LAB — BETA-HYDROXYBUTYRIC ACID: Beta-Hydroxybutyric Acid: 0.27 mmol/L (ref 0.05–0.27)

## 2017-02-19 LAB — BRAIN NATRIURETIC PEPTIDE: B Natriuretic Peptide: 54.8 pg/mL (ref 0.0–100.0)

## 2017-02-19 LAB — PROCALCITONIN: PROCALCITONIN: 0.24 ng/mL

## 2017-02-19 LAB — D-DIMER, QUANTITATIVE (NOT AT ARMC): D DIMER QUANT: 0.63 ug{FEU}/mL — AB (ref 0.00–0.50)

## 2017-02-19 LAB — POCT ACTIVATED CLOTTING TIME: Activated Clotting Time: 120 seconds

## 2017-02-19 LAB — MRSA PCR SCREENING: MRSA by PCR: NEGATIVE

## 2017-02-19 LAB — LACTIC ACID, PLASMA
LACTIC ACID, VENOUS: 2.3 mmol/L — AB (ref 0.5–1.9)
LACTIC ACID, VENOUS: 2.8 mmol/L — AB (ref 0.5–1.9)

## 2017-02-19 LAB — HIV ANTIBODY (ROUTINE TESTING W REFLEX): HIV SCREEN 4TH GENERATION: NONREACTIVE

## 2017-02-19 LAB — MAGNESIUM: MAGNESIUM: 2.1 mg/dL (ref 1.7–2.4)

## 2017-02-19 MED ORDER — ALBUTEROL SULFATE (2.5 MG/3ML) 0.083% IN NEBU
2.5000 mg | INHALATION_SOLUTION | RESPIRATORY_TRACT | Status: DC | PRN
  Administered 2017-02-19: 10 mg via RESPIRATORY_TRACT
  Filled 2017-02-19 (×4): qty 3

## 2017-02-19 MED ORDER — ALBUTEROL SULFATE (2.5 MG/3ML) 0.083% IN NEBU
2.5000 mg | INHALATION_SOLUTION | RESPIRATORY_TRACT | Status: DC
  Administered 2017-02-19 – 2017-02-22 (×18): 2.5 mg via RESPIRATORY_TRACT
  Filled 2017-02-19 (×17): qty 3

## 2017-02-19 MED ORDER — MOMETASONE FURO-FORMOTEROL FUM 200-5 MCG/ACT IN AERO: 2.0000 | INHALATION_SPRAY | Freq: Two times a day (BID) | RESPIRATORY_TRACT | Status: DC

## 2017-02-19 MED ORDER — MIDAZOLAM HCL 2 MG/2ML IJ SOLN
INTRAMUSCULAR | Status: AC
  Filled 2017-02-19: qty 2

## 2017-02-19 MED ORDER — SODIUM CHLORIDE 0.9 % IV SOLN
2.0000 mg/h | INTRAVENOUS | Status: DC
  Administered 2017-02-19 – 2017-02-20 (×2): 7 mg/h via INTRAVENOUS
  Administered 2017-02-20: 8 mg/h via INTRAVENOUS
  Administered 2017-02-20: 7 mg/h via INTRAVENOUS
  Filled 2017-02-19 (×5): qty 10

## 2017-02-19 MED ORDER — ETOMIDATE 2 MG/ML IV SOLN
INTRAVENOUS | Status: DC | PRN
  Administered 2017-02-19: 20 mg via INTRAVENOUS

## 2017-02-19 MED ORDER — SODIUM CHLORIDE 0.9 % IV BOLUS (SEPSIS): 1000.0000 mL | Freq: Once | INTRAVENOUS | Status: AC

## 2017-02-19 MED ORDER — INSULIN ASPART 100 UNIT/ML ~~LOC~~ SOLN
0.0000 [IU] | SUBCUTANEOUS | Status: DC
  Administered 2017-02-19 (×2): 15 [IU] via SUBCUTANEOUS
  Filled 2017-02-19: qty 1

## 2017-02-19 MED ORDER — DEXTROSE 5 % IV SOLN
2.0000 g | INTRAVENOUS | Status: DC
  Administered 2017-02-19: 2 g via INTRAVENOUS
  Filled 2017-02-19 (×2): qty 2

## 2017-02-19 MED ORDER — ARFORMOTEROL TARTRATE 15 MCG/2ML IN NEBU
15.0000 ug | INHALATION_SOLUTION | Freq: Two times a day (BID) | RESPIRATORY_TRACT | Status: DC
  Administered 2017-02-19: 15 ug via RESPIRATORY_TRACT
  Filled 2017-02-19: qty 2

## 2017-02-19 MED ORDER — MIDAZOLAM HCL 2 MG/2ML IJ SOLN: 2.0000 mg | Freq: Once | INTRAMUSCULAR | Status: DC

## 2017-02-19 MED ORDER — WHITE PETROLATUM GEL
Status: AC
  Administered 2017-02-20: 22:00:00
  Filled 2017-02-19: qty 1

## 2017-02-19 MED ORDER — ROCURONIUM BROMIDE 50 MG/5ML IV SOLN
INTRAVENOUS | Status: DC | PRN
  Administered 2017-02-19: 50 mg via INTRAVENOUS

## 2017-02-19 MED ORDER — POTASSIUM CHLORIDE 20 MEQ/15ML (10%) PO SOLN: 40.0000 meq | Freq: Once | ORAL | Status: DC

## 2017-02-19 MED ORDER — FENTANYL BOLUS VIA INFUSION
50.0000 ug | INTRAVENOUS | Status: DC | PRN
  Administered 2017-02-20: 50 ug via INTRAVENOUS
  Filled 2017-02-19: qty 50

## 2017-02-19 MED ORDER — SODIUM CHLORIDE 0.9 % IV SOLN
0.0000 mg/h | INTRAVENOUS | Status: DC
  Administered 2017-02-19: 2 mg/h via INTRAVENOUS
  Filled 2017-02-19: qty 10

## 2017-02-19 MED ORDER — SODIUM POLYSTYRENE SULFONATE 15 GM/60ML PO SUSP
30.0000 g | Freq: Once | ORAL | Status: AC
  Administered 2017-02-19: 30 g
  Filled 2017-02-19: qty 120

## 2017-02-19 MED ORDER — SIMVASTATIN 10 MG PO TABS
10.0000 mg | ORAL_TABLET | Freq: Every day | ORAL | Status: DC
  Administered 2017-02-19 – 2017-02-23 (×5): 10 mg
  Filled 2017-02-19 (×7): qty 1

## 2017-02-19 MED ORDER — VITAL HIGH PROTEIN PO LIQD
1000.0000 mL | ORAL | Status: DC
  Administered 2017-02-19 – 2017-02-21 (×3): 1000 mL
  Administered 2017-02-21: 20:00:00
  Administered 2017-02-22: 1000 mL
  Administered 2017-02-22
  Administered 2017-02-23 (×2): 1000 mL
  Filled 2017-02-19 (×3): qty 1000

## 2017-02-19 MED ORDER — PRISMASOL BGK 0/2.5 32-2.5 MEQ/L IV SOLN
INTRAVENOUS | Status: DC
  Filled 2017-02-19 (×2): qty 5000

## 2017-02-19 MED ORDER — NAPROXEN 250 MG PO TABS: 500.0000 mg | ORAL_TABLET | Freq: Two times a day (BID) | ORAL | Status: DC

## 2017-02-19 MED ORDER — FENTANYL 2500MCG IN NS 250ML (10MCG/ML) PREMIX INFUSION
25.0000 ug/h | INTRAVENOUS | Status: DC
  Administered 2017-02-19: 200 ug/h via INTRAVENOUS
  Filled 2017-02-19: qty 250

## 2017-02-19 MED ORDER — HEPARIN SODIUM (PORCINE) 1000 UNIT/ML IJ SOLN
2400.0000 [IU] | Freq: Once | INTRAMUSCULAR | Status: AC
  Administered 2017-02-19: 2400 [IU]
  Filled 2017-02-19: qty 3

## 2017-02-19 MED ORDER — BROMOCRIPTINE MESYLATE 2.5 MG PO TABS: 2.5000 mg | ORAL_TABLET | Freq: Every day | ORAL | Status: DC

## 2017-02-19 MED ORDER — PANTOPRAZOLE SODIUM 40 MG PO TBEC
40.0000 mg | DELAYED_RELEASE_TABLET | Freq: Every day | ORAL | Status: DC
Start: 2017-02-19 — End: 2017-02-19

## 2017-02-19 MED ORDER — METHOCARBAMOL 500 MG PO TABS: 500.0000 mg | ORAL_TABLET | Freq: Three times a day (TID) | ORAL | Status: DC | PRN

## 2017-02-19 MED ORDER — MIDAZOLAM BOLUS VIA INFUSION
1.0000 mg | INTRAVENOUS | Status: DC | PRN
  Filled 2017-02-19: qty 2

## 2017-02-19 MED ORDER — SODIUM CHLORIDE 0.9% FLUSH: 3.0000 mL | Freq: Two times a day (BID) | INTRAVENOUS | Status: DC

## 2017-02-19 MED ORDER — HEPARIN SODIUM (PORCINE) 1000 UNIT/ML DIALYSIS
1000.0000 [IU] | INTRAMUSCULAR | Status: DC | PRN
  Administered 2017-02-22: 3000 [IU] via INTRAVENOUS_CENTRAL
  Filled 2017-02-19 (×2): qty 6

## 2017-02-19 MED ORDER — CISATRACURIUM BOLUS VIA INFUSION
2.5000 mg | Freq: Once | INTRAVENOUS | Status: DC
  Filled 2017-02-19: qty 3

## 2017-02-19 MED ORDER — CALCIUM GLUCONATE 10 % IV SOLN
1.0000 g | Freq: Once | INTRAVENOUS | Status: AC
  Administered 2017-02-19: 1 g via INTRAVENOUS
  Filled 2017-02-19: qty 10

## 2017-02-19 MED ORDER — ENOXAPARIN SODIUM 60 MG/0.6ML ~~LOC~~ SOLN
55.0000 mg | SUBCUTANEOUS | Status: DC
  Administered 2017-02-19 – 2017-02-23 (×5): 55 mg via SUBCUTANEOUS
  Filled 2017-02-19 (×5): qty 0.55

## 2017-02-19 MED ORDER — GABAPENTIN 300 MG PO CAPS: 300.0000 mg | ORAL_CAPSULE | Freq: Three times a day (TID) | ORAL | Status: DC

## 2017-02-19 MED ORDER — ALBUTEROL SULFATE HFA 108 (90 BASE) MCG/ACT IN AERS: 2.0000 | INHALATION_SPRAY | RESPIRATORY_TRACT | Status: DC | PRN

## 2017-02-19 MED ORDER — METHYLPREDNISOLONE SODIUM SUCC 125 MG IJ SOLR
60.0000 mg | Freq: Four times a day (QID) | INTRAMUSCULAR | Status: DC
  Administered 2017-02-19 – 2017-02-21 (×10): 60 mg via INTRAVENOUS
  Filled 2017-02-19 (×4): qty 0.96
  Filled 2017-02-19: qty 2
  Filled 2017-02-19 (×4): qty 0.96
  Filled 2017-02-19: qty 2
  Filled 2017-02-19: qty 0.96

## 2017-02-19 MED ORDER — MIDAZOLAM HCL 2 MG/2ML IJ SOLN
INTRAMUSCULAR | Status: DC | PRN
  Administered 2017-02-19: 2 mg via INTRAVENOUS

## 2017-02-19 MED ORDER — ALBUTEROL SULFATE (2.5 MG/3ML) 0.083% IN NEBU: 2.5000 mg | INHALATION_SOLUTION | RESPIRATORY_TRACT | Status: DC | PRN

## 2017-02-19 MED ORDER — DEXTROSE 5 % IV SOLN
20.0000 g | INTRAVENOUS | Status: DC
  Administered 2017-02-19: 20 g via INTRAVENOUS_CENTRAL
  Filled 2017-02-19 (×3): qty 200

## 2017-02-19 MED ORDER — FENTANYL CITRATE (PF) 100 MCG/2ML IJ SOLN
INTRAMUSCULAR | Status: AC
  Filled 2017-02-19: qty 2

## 2017-02-19 MED ORDER — SODIUM CHLORIDE 0.9 % IV BOLUS (SEPSIS)
1000.0000 mL | Freq: Once | INTRAVENOUS | Status: AC
  Administered 2017-02-19: 1000 mL via INTRAVENOUS

## 2017-02-19 MED ORDER — MIDAZOLAM BOLUS VIA INFUSION
2.0000 mg | INTRAVENOUS | Status: DC | PRN
  Administered 2017-02-20: 2 mg via INTRAVENOUS
  Filled 2017-02-19: qty 2

## 2017-02-19 MED ORDER — FENTANYL 2500MCG IN NS 250ML (10MCG/ML) PREMIX INFUSION
25.0000 ug/h | INTRAVENOUS | Status: DC
  Administered 2017-02-19 – 2017-02-20 (×5): 400 ug/h via INTRAVENOUS
  Administered 2017-02-21: 250 ug/h via INTRAVENOUS
  Administered 2017-02-21: 300 ug/h via INTRAVENOUS
  Administered 2017-02-21 – 2017-02-22 (×2): 250 ug/h via INTRAVENOUS
  Administered 2017-02-22: 300 ug/h via INTRAVENOUS
  Administered 2017-02-22: 250 ug/h via INTRAVENOUS
  Filled 2017-02-19 (×11): qty 250

## 2017-02-19 MED ORDER — ORAL CARE MOUTH RINSE
15.0000 mL | Freq: Four times a day (QID) | OROMUCOSAL | Status: DC
  Administered 2017-02-19 – 2017-02-24 (×19): 15 mL via OROMUCOSAL

## 2017-02-19 MED ORDER — FENTANYL CITRATE (PF) 100 MCG/2ML IJ SOLN
INTRAMUSCULAR | Status: DC | PRN
  Administered 2017-02-19: 100 ug via INTRAVENOUS

## 2017-02-19 MED ORDER — INSULIN ASPART 100 UNIT/ML ~~LOC~~ SOLN: 0.0000 [IU] | Freq: Three times a day (TID) | SUBCUTANEOUS | Status: DC

## 2017-02-19 MED ORDER — POTASSIUM CHLORIDE 20 MEQ/15ML (10%) PO SOLN: 30.0000 meq | ORAL | Status: DC

## 2017-02-19 MED ORDER — PRISMASOL BGK 0/2.5 32-2.5 MEQ/L IV SOLN
INTRAVENOUS | Status: DC
  Administered 2017-02-19 – 2017-02-20 (×7): via INTRAVENOUS_CENTRAL
  Filled 2017-02-19 (×11): qty 5000

## 2017-02-19 MED ORDER — ROCURONIUM BROMIDE 50 MG/5ML IV SOLN
100.0000 mg | Freq: Once | INTRAVENOUS | Status: AC
  Administered 2017-02-19: 100 mg via INTRAVENOUS
  Filled 2017-02-19: qty 10

## 2017-02-19 MED ORDER — METHYLPREDNISOLONE SODIUM SUCC 40 MG IJ SOLR: 40.0000 mg | Freq: Four times a day (QID) | INTRAMUSCULAR | Status: DC

## 2017-02-19 MED ORDER — FENTANYL CITRATE (PF) 100 MCG/2ML IJ SOLN: 100.0000 ug | Freq: Once | INTRAMUSCULAR | Status: DC | PRN

## 2017-02-19 MED ORDER — SODIUM BICARBONATE 8.4 % IV SOLN
100.0000 meq | Freq: Once | INTRAVENOUS | Status: AC
  Administered 2017-02-19: 100 meq via INTRAVENOUS
  Filled 2017-02-19: qty 100

## 2017-02-19 MED ORDER — CHLORHEXIDINE GLUCONATE 0.12% ORAL RINSE (MEDLINE KIT)
15.0000 mL | Freq: Two times a day (BID) | OROMUCOSAL | Status: DC
  Administered 2017-02-19: 15 mL via OROMUCOSAL

## 2017-02-19 MED ORDER — CHLORHEXIDINE GLUCONATE 0.12% ORAL RINSE (MEDLINE KIT)
15.0000 mL | Freq: Two times a day (BID) | OROMUCOSAL | Status: DC
  Administered 2017-02-19 – 2017-02-24 (×10): 15 mL via OROMUCOSAL

## 2017-02-19 MED ORDER — STERILE WATER FOR INJECTION IV SOLN
INTRAVENOUS | Status: DC
  Administered 2017-02-19 (×2): via INTRAVENOUS
  Filled 2017-02-19 (×4): qty 850

## 2017-02-19 MED ORDER — SODIUM CHLORIDE 0.9 % IV SOLN: INTRAVENOUS | Status: DC

## 2017-02-19 MED ORDER — PANTOPRAZOLE SODIUM 40 MG PO PACK
40.0000 mg | PACK | Freq: Every day | ORAL | Status: DC
  Administered 2017-02-19 – 2017-02-23 (×5): 40 mg
  Filled 2017-02-19 (×6): qty 20

## 2017-02-19 MED ORDER — SODIUM CHLORIDE 0.9 % IV SOLN
INTRAVENOUS | Status: DC
  Administered 2017-02-19: 11.8 [IU]/h via INTRAVENOUS
  Administered 2017-02-19: 2.9 [IU]/h via INTRAVENOUS
  Administered 2017-02-20: 19.5 [IU]/h via INTRAVENOUS
  Administered 2017-02-20: 23.2 [IU]/h via INTRAVENOUS
  Filled 2017-02-19 (×5): qty 1

## 2017-02-19 MED ORDER — ASPIRIN 325 MG PO TABS
325.0000 mg | ORAL_TABLET | Freq: Every day | ORAL | Status: DC
  Administered 2017-02-19 – 2017-02-27 (×9): 325 mg
  Filled 2017-02-19 (×9): qty 1

## 2017-02-19 MED ORDER — ALBUTEROL (5 MG/ML) CONTINUOUS INHALATION SOLN
10.0000 mg/h | INHALATION_SOLUTION | RESPIRATORY_TRACT | Status: AC
  Filled 2017-02-19: qty 20

## 2017-02-19 MED ORDER — ACETAMINOPHEN-CODEINE #3 300-30 MG PO TABS: 1.0000 | ORAL_TABLET | Freq: Two times a day (BID) | ORAL | Status: DC | PRN

## 2017-02-19 MED ORDER — ORAL CARE MOUTH RINSE
15.0000 mL | Freq: Four times a day (QID) | OROMUCOSAL | Status: DC
  Administered 2017-02-19: 15 mL via OROMUCOSAL

## 2017-02-19 MED ORDER — BUDESONIDE 0.5 MG/2ML IN SUSP
0.5000 mg | Freq: Two times a day (BID) | RESPIRATORY_TRACT | Status: DC
  Administered 2017-02-19: 0.5 mg via RESPIRATORY_TRACT
  Filled 2017-02-19: qty 2

## 2017-02-19 MED ORDER — ARTIFICIAL TEARS OPHTHALMIC OINT
1.0000 "application " | TOPICAL_OINTMENT | Freq: Three times a day (TID) | OPHTHALMIC | Status: DC
  Administered 2017-02-19 – 2017-02-21 (×7): 1 via OPHTHALMIC
  Filled 2017-02-19: qty 3.5

## 2017-02-19 MED ORDER — FENTANYL 2500MCG IN NS 250ML (10MCG/ML) PREMIX INFUSION
25.0000 ug/h | INTRAVENOUS | Status: DC
  Administered 2017-02-19: 25 ug/h via INTRAVENOUS
  Filled 2017-02-19: qty 250

## 2017-02-19 MED ORDER — SIMVASTATIN 10 MG PO TABS: 10.0000 mg | ORAL_TABLET | Freq: Every day | ORAL | Status: DC

## 2017-02-19 MED ORDER — ASPIRIN EC 325 MG PO TBEC: 325.0000 mg | DELAYED_RELEASE_TABLET | Freq: Every day | ORAL | Status: DC

## 2017-02-19 MED ORDER — SODIUM CHLORIDE 0.9 % IV SOLN
0.0000 mg/h | INTRAVENOUS | Status: DC
  Administered 2017-02-19: 5 mg/h via INTRAVENOUS
  Filled 2017-02-19: qty 10

## 2017-02-19 MED ORDER — HYDROCHLOROTHIAZIDE 25 MG PO TABS
25.0000 mg | ORAL_TABLET | Freq: Every day | ORAL | Status: DC
Start: 2017-02-19 — End: 2017-02-19

## 2017-02-19 MED ORDER — ALBUTEROL (5 MG/ML) CONTINUOUS INHALATION SOLN: 20.0000 mg/h | INHALATION_SOLUTION | RESPIRATORY_TRACT | Status: DC

## 2017-02-19 MED ORDER — IPRATROPIUM-ALBUTEROL 0.5-2.5 (3) MG/3ML IN SOLN: 3.0000 mL | RESPIRATORY_TRACT | Status: DC

## 2017-02-19 MED ORDER — SERTRALINE HCL 50 MG PO TABS: 50.0000 mg | ORAL_TABLET | Freq: Every day | ORAL | Status: DC

## 2017-02-19 MED ORDER — PROPOFOL 1000 MG/100ML IV EMUL
5.0000 ug/kg/min | INTRAVENOUS | Status: DC
  Administered 2017-02-19: 40 ug/kg/min via INTRAVENOUS
  Administered 2017-02-19: 50 ug/kg/min via INTRAVENOUS
  Filled 2017-02-19 (×3): qty 100

## 2017-02-19 MED ORDER — LOSARTAN POTASSIUM 50 MG PO TABS: 100.0000 mg | ORAL_TABLET | Freq: Every day | ORAL | Status: DC

## 2017-02-19 MED ORDER — MIDAZOLAM HCL 2 MG/2ML IJ SOLN: 2.0000 mg | Freq: Once | INTRAMUSCULAR | Status: DC | PRN

## 2017-02-19 MED ORDER — SODIUM CHLORIDE 0.9 % IV SOLN
3.0000 ug/kg/min | INTRAVENOUS | Status: DC
  Administered 2017-02-19 (×2): 3 ug/kg/min via INTRAVENOUS
  Filled 2017-02-19 (×3): qty 20

## 2017-02-19 MED ORDER — SODIUM CHLORIDE 0.9 % FOR CRRT
INTRAVENOUS_CENTRAL | Status: DC | PRN
  Filled 2017-02-19: qty 1000

## 2017-02-19 MED ORDER — FENTANYL BOLUS VIA INFUSION
50.0000 ug | INTRAVENOUS | Status: DC | PRN
Start: 2017-02-19 — End: 2017-02-19
  Filled 2017-02-19: qty 50

## 2017-02-19 MED ORDER — ANTICOAGULANT SODIUM CITRATE 4% (200MG/5ML) IV SOLN
Status: DC
  Administered 2017-02-19 – 2017-02-20 (×2): via INTRAVENOUS_CENTRAL
  Filled 2017-02-19 (×5): qty 1500

## 2017-02-19 MED ORDER — TOPIRAMATE 100 MG PO TABS
100.0000 mg | ORAL_TABLET | Freq: Two times a day (BID) | ORAL | Status: DC
  Administered 2017-02-19 – 2017-02-25 (×12): 100 mg via ORAL
  Filled 2017-02-19 (×17): qty 1

## 2017-02-19 MED ORDER — OLOPATADINE HCL 0.1 % OP SOLN
1.0000 [drp] | Freq: Two times a day (BID) | OPHTHALMIC | Status: DC
  Administered 2017-02-19 – 2017-02-27 (×17): 1 [drp] via OPHTHALMIC
  Filled 2017-02-19: qty 5

## 2017-02-19 MED ORDER — AMLODIPINE BESYLATE 5 MG PO TABS: 5.0000 mg | ORAL_TABLET | Freq: Every day | ORAL | Status: DC

## 2017-02-19 MED ORDER — FENTANYL CITRATE (PF) 100 MCG/2ML IJ SOLN: 100.0000 ug | Freq: Once | INTRAMUSCULAR | Status: DC

## 2017-02-19 NOTE — Consult Note (Signed)
PULMONARY / CRITICAL CARE MEDICINE   Name: [name] MRN: [id number] DOB: 1971/02/19    ADMISSION DATE:  02/18/2017 CONSULTATION DATE:  02/18/17  REFERRING MD:  Gwendlyn Deutscher  CHIEF COMPLAINT:  SOB  HISTORY OF PRESENT ILLNESS:  [name] is a 46 y.o. female with PMH as outlined below including but not limited to Asthma.  She presented to [facility] ED 5/30 with SOB that began the afternoon prior. Symptoms have progressively worsened since onset.  She had been in her usual state of health prior and only exposure to sick contacts was her husband who had cough and runny nose.  Smokes intermittently for the last 3 years.  She has been hospitalized for asthma multiple times in the past but not required intubation.  In ED, she received 2 CAT's; however, remained in respiratory distress with increased WOB.  ABG worse; therefore, PCCM asked to see in consultation.  Given work of breathing and worsening ABG, decision made to proceed with intubation.  PAST MEDICAL HISTORY :  She  has a past medical history of Arthritis; Asthma; Chronic back pain; Chronic chest pain; Depression; DM (diabetes mellitus) ([place]); GERD (gastroesophageal reflux disease); Headache(784.0); Hypertension; and Hypokalemia.  PAST SURGICAL HISTORY: She  has a past surgical history that includes Cesarean section; Appendectomy; Vesicovaginal fistula closure w/ TAH (2009); Hernia repair; Cholecystectomy (N/A, 03/02/2014); and Rotator cuff repair.  Allergies  Allergen Reactions  . Reglan [Metoclopramide] Other (See Comments)    Panic attack    No current facility-administered medications on file prior to encounter.    Current Outpatient Prescriptions on File Prior to Encounter  Medication Sig  . acetaminophen-codeine (TYLENOL #3) 300-30 MG tablet Take 1 tablet by mouth every 12 (twelve) hours as needed for moderate pain.  Marland Kitchen albuterol (PROVENTIL HFA;VENTOLIN HFA) 108 (90 Base) MCG/ACT inhaler Inhale 2  puffs into the lungs every 6 (six) hours as needed for wheezing or shortness of breath. Reported on 02/22/2016  . albuterol (PROVENTIL) (2.5 MG/3ML) 0.083% nebulizer solution Take 3 mLs (2.5 mg total) by nebulization every 6 (six) hours as needed for wheezing or shortness of breath. Reported on 02/22/2016  . amLODipine (NORVASC) 5 MG tablet Take 1 tablet (5 mg total) by mouth daily.  Marland Kitchen aspirin EC 325 MG tablet Take 325 mg by mouth daily.  . Blood Glucose Monitoring Suppl (ACCU-CHEK AVIVA PLUS) w/Device KIT Use to check blood sugar 3 times per day. DX CODE: E11.9  . bromocriptine (PARLODEL) 2.5 MG tablet 1/4 tab daily  . dapagliflozin propanediol (FARXIGA) 10 MG TABS tablet Take 10 mg by mouth daily.  . Dulaglutide (TRULICITY) 1.5 AC/1.6SA SOPN Inject 1.5 mg into the skin once a week.  . Fluticasone-Salmeterol (ADVAIR) 250-50 MCG/DOSE AEPB Inhale 1 puff into the lungs 2 times daily at 12 noon and 4 pm.  . gabapentin (NEURONTIN) 300 MG capsule Take 1 capsule (300 mg total) by mouth 3 (three) times daily.  Marland Kitchen glucose blood (ACCU-CHEK AVIVA PLUS) test strip Use to check blood sugar 3 times per day.DX Code E11.9  . hydrochlorothiazide (HYDRODIURIL) 25 MG tablet Take 1 tablet (25 mg total) by mouth daily.  . Insulin Pen Needle 32G X 4 MM MISC Use to inject insulin 3 times per day.  Elmore Guise Devices (ACCU-CHEK SOFTCLIX) lancets Use to check blood sugar 3 times per day dx code E11.9  . Lancets (ACCU-CHEK MULTICLIX) lancets Use to check blood sugar 3 times per day. DX code E11.9  . losartan (COZAAR) 100 MG tablet Take 1  tablet (100 mg total) by mouth daily. For Blood pressure - must have office visit for refills  . methocarbamol (ROBAXIN) 500 MG tablet Take 1 tablet (500 mg total) by mouth every 8 (eight) hours as needed for muscle spasms.  . naproxen (NAPROSYN) 500 MG tablet Take 1 tablet (500 mg total) by mouth 2 (two) times daily with a meal.  . olopatadine (PATANOL) 0.1 % ophthalmic solution Place 1 drop  into the right eye 2 (two) times daily.  Marland Kitchen oxyCODONE-acetaminophen (PERCOCET) 5-325 MG tablet Take 1-2 tablets by mouth every 4 (four) hours as needed.  . pantoprazole (PROTONIX) 40 MG tablet Take 1 tablet (40 mg total) by mouth daily.  . prochlorperazine (COMPAZINE) 10 MG tablet Take 1 tablet (10 mg total) by mouth 2 (two) times daily as needed for nausea or vomiting.  . repaglinide (PRANDIN) 2 MG tablet 1 tab with breakfast and lunch, and 2 tabs with dinner.  . sertraline (ZOLOFT) 50 MG tablet Take 1 tablet (50 mg total) by mouth daily.  . simvastatin (ZOCOR) 10 MG tablet Take 1 tablet (10 mg total) by mouth daily at 6 PM.  . SUMAtriptan (IMITREX) 100 MG tablet TAKE 1TAB AT EARLIEST ONSET OF HEADACHE. MAY REPEAT X1 IN 2 HOURS IF HEADACHE PERSISTS OR RECURS. DO NOT EXCEED 2 TABS IN 24HRS  . topiramate (TOPAMAX) 100 MG tablet Take 1 tablet (100 mg total) by mouth 2 (two) times daily.  . [DISCONTINUED] promethazine (PHENERGAN) 25 MG tablet Take 1 tablet (25 mg total) by mouth every 6 (six) hours as needed for nausea. (Patient not taking: Reported on 12/13/2014)    FAMILY HISTORY:  Her indicated that her mother is deceased. She indicated that her father is deceased.    SOCIAL HISTORY: She  reports that she has been smoking Cigarettes.  She has a 5.50 pack-year smoking history. She has never used smokeless tobacco. She reports that she drinks about 0.6 - 1.2 oz of alcohol per week . She reports that she does not use drugs.  REVIEW OF SYSTEMS:   All negative; except for those that are bolded, which indicate positives.  Constitutional: weight loss, weight gain, night sweats, fevers, chills, fatigue, weakness.  HEENT: headaches, sore throat, sneezing, nasal congestion, post nasal drip, difficulty swallowing, tooth/dental problems, visual complaints, visual changes, ear aches. Neuro: difficulty with speech, weakness, numbness, ataxia. CV:  chest pain, orthopnea, PND, swelling in lower extremities,  dizziness, palpitations, syncope.  Resp: cough, hemoptysis, dyspnea, wheezing. GI: heartburn, indigestion, abdominal pain, nausea, vomiting, diarrhea, constipation, change in bowel habits, loss of appetite, hematemesis, melena, hematochezia.  GU: dysuria, change in color of urine, urgency or frequency, flank pain, hematuria. MSK: joint pain or swelling, decreased range of motion. Psych: change in mood or affect, depression, anxiety, suicidal ideations, homicidal ideations. Skin: rash, itching, bruising.   SUBJECTIVE:  Minimal relief after 2 CAT's.  Still with increased WOB.  VITAL SIGNS: BP (!) 162/88   Pulse (!) 114   Resp 16   SpO2 99%   HEMODYNAMICS:    VENTILATOR SETTINGS:    INTAKE / OUTPUT: I/O last 3 completed shifts: In: 54 [IV Piggyback:50] Out: -    PHYSICAL EXAMINATION: General: Middle aged female, in respiratory distress. Neuro: A&O x 3, non-focal.  HEENT: [place]/AT. PERRL, sclerae anicteric. Cardiovascular: Tachy, regular, no M/R/G.  Lungs: Respirations shallow and labored.  Diffuse wheezes. Abdomen: BS x 4, soft, NT/ND.  Musculoskeletal: No gross deformities, no edema.  Skin: Intact, warm, no rashes.  LABS:  BMET  Recent Labs Lab 02/18/17 1648  NA 138  K 3.0*  CL 108  CO2 21*  BUN 15  CREATININE 1.00  GLUCOSE 161*    Electrolytes  Recent Labs Lab 02/18/17 1648  CALCIUM 8.6*    CBC  Recent Labs Lab 02/18/17 1648  WBC 11.1*  HGB 11.5*  HCT 35.6*  PLT 259    Coag's No results for input(s): APTT, INR in the last 168 hours.  Sepsis Markers No results for input(s): LATICACIDVEN, PROCALCITON, O2SATVEN in the last 168 hours.  ABG  Recent Labs Lab 02/18/17 1938  PHART 7.367  PCO2ART 28.7*  PO2ART 71.0*    Liver Enzymes No results for input(s): AST, ALT, ALKPHOS, BILITOT, ALBUMIN in the last 168 hours.  Cardiac Enzymes No results for input(s): TROPONINI, PROBNP in the last 168 hours.  Glucose No results for input(s):  GLUCAP in the last 168 hours.  Imaging Dg Chest Port 1 View  Result Date: 02/18/2017 CLINICAL DATA:  Shortness of breath and cough for 2 days EXAM: PORTABLE CHEST 1 VIEW COMPARISON:  08/22/2015 FINDINGS: The heart size and mediastinal contours are within normal limits. Both lungs are clear. The visualized skeletal structures are unremarkable. IMPRESSION: No active disease. Electronically Signed   By: Inez Catalina M.D.   On: 02/18/2017 16:36     STUDIES:  CXR 5/30 > no acute process.  CULTURES: Sputum 5/30 >   ANTIBIOTICS: None.  SIGNIFICANT EVENTS: 5/30 > admit.  LINES/TUBES: ETT 5/30 >   DISCUSSION: 46 y.o. female admitted 5/30 with asthma exacerbation that failed conservative management and required intubation.  ASSESSMENT / PLAN:  PULMONARY A: Asthma exacerbation - failed conservative treatments and now requiring intubation. Tobacco dependence. P:   Full vent support. Wean as able. VAP prevention measures. SBT in AM if able. Steroids, BD's. CXR in AM.  CARDIOVASCULAR A:  Chest tightness - presumed due to asthma exacerbation. Initial EKG negative. Hx HTN, HLD. P:  Trend troponins. Hold preadmission amlodipine, HCTZ, losartan,   RENAL A:   Hypokalemia - s/p repletion in ED. P:   Additional 13mq K per tube now. NS @ 75. BMP in AM.  GASTROINTESTINAL A:   GERD. Nutrition. P:   SUP: Pantoprazole. NPO.  HEMATOLOGIC A:   VTE Prophylaxis. P:  SCD's / lovenox. CBC in AM.  INFECTIOUS A:   No indication of infection. P:   Monitor clinically.  ENDOCRINE A:   DM. P:   SSI. Hold preadmission farxiga, repaglinide, trulicity.  NEUROLOGIC A:   Acute encephalopathy - due to sedation. Hx depression, chronic back pain, migraines. P:   Sedation:  Propofol gtt / Fentanyl PRN. RASS goal: 0 to -1. Daily WUA. Hold preadmission zoloft, percocet, gabapentin, robaxin, topamax, sumatriptan.  Family updated: Husband updated by Dr.  YNelda Marseille  Interdisciplinary Family Meeting v Palliative Care Meeting:  Due by: 02/24/17.  CC time: 30 min.   RMontey Hora PBeal CityPulmonary & Critical Care Medicine Pager: [phone] or ([phone]/31/2018, 12:32 AM  Attending Note:  46year old asthmatic smoker who presents with asthma exacerbation and respiratory failure.  Patient's ABG worsened and PCCM was called on consultation.  On exam, patient is not speaking in full sentences and is clearly struggling to breath.  I reviewed cxr myself, no acute diseased noted.  Discussed with FPTS.  Will intubate now.  F/U ABG and CXR ordered.  Increased IV steroids.  This is a non-gapped acidosis so doubt DKA but  will check a beta-hydroxybuterate.  Adjust vent for ABG.  No need for abx at this time.  Propofol and fentanyl for sedation.  PCCM will admit to the ICU and take on our service.  The patient is critically ill with multiple organ systems failure and requires high complexity decision making for assessment and support, frequent evaluation and titration of therapies, application of advanced monitoring technologies and extensive interpretation of multiple databases.   Critical Care Time devoted to patient care services described in this note is  35  Minutes. This time reflects time of care of this signee Dr Jennet Maduro. This critical care time does not reflect procedure time, or teaching time or supervisory time of PA/NP/Med student/Med Resident etc but could involve care discussion time.  Rush Farmer, M.D. [facility] Pulmonary/Critical Care Medicine. Pager: [phone]. After hours pager: [phone].

## 2017-02-19 NOTE — Progress Notes (Signed)
Urine specimen sent to lab @1300 . Foley was placed at 0300 on 02/19/17 urine was not obtained at that time.

## 2017-02-19 NOTE — Progress Notes (Signed)
Family Medicine Teaching Service appreciate CCM management of [name]. We will assume care once she is transferred out of ICU.  Marjie Skiff, MD Family Medicine PGY-1

## 2017-02-19 NOTE — Progress Notes (Signed)
MD made aware of critical values on ABG

## 2017-02-19 NOTE — Progress Notes (Signed)
[facility] Progress Note Patient Name: [name] DOB: April 16, 1971 MRN: [id number]   Date of Service  02/19/2017  HPI/Events of Note  Patient currently on CVVHD. Hypokalemia. Has enteric feeding tube in place.   eICU Interventions  KCl 60 mEq via tube      Intervention Category Major Interventions: Electrolyte abnormality - evaluation and management  Tera Partridge 02/19/2017, 9:22 PM

## 2017-02-19 NOTE — Progress Notes (Signed)
CRITICAL VALUE ALERT  Critical Value:  Troponin 0.11   Potassium 6.8  Date & Time Notied:  02/19/17 1057  Provider Notified: Dr. Lake Bells  No new orders received at this time

## 2017-02-19 NOTE — Procedures (Signed)
Intubation Procedure Note Adrea Sherpa [name] [id number] 12-Nov-1970  Procedure: Intubation Indications: Respiratory insufficiency  Procedure Details Consent: Risks of procedure as well as the alternatives and risks of each were explained to the (patient/caregiver).  Consent for procedure obtained. Time Out: Verified patient identification, verified procedure, site/side was marked, verified correct patient position, special equipment/implants available, medications/allergies/relevent history reviewed, required imaging and test results available.  Performed  Maximum sterile technique was used including gloves, hand hygiene and mask.  MAC    Evaluation Hemodynamic Status: BP stable throughout; O2 sats: stable throughout Patient's Current Condition: stable Complications: No apparent complications Patient did tolerate procedure well. Chest X-ray ordered to verify placement.  CXR: pending.   Jennet Maduro 02/19/2017

## 2017-02-19 NOTE — Progress Notes (Signed)
PULMONARY / CRITICAL CARE MEDICINE   Name: [name] MRN: [id number] DOB: 12-11-1970    ADMISSION DATE:  02/18/2017 CONSULTATION DATE:  02/18/17  REFERRING MD:  Gwendlyn Deutscher  CHIEF COMPLAINT:  SOB  HISTORY OF PRESENT ILLNESS:  [name] is a 46 y.o. female with PMH as outlined below including but not limited to Asthma.  She presented to [facility] ED 5/30 with SOB that began the afternoon prior. Symptoms have progressively worsened since onset.  She had been in her usual state of health prior and only exposure to sick contacts was her husband who had cough and runny nose.  Smokes intermittently for the last 3 years.  She has been hospitalized for asthma multiple times in the past but not required intubation.  In ED, she received 2 CAT's; however, remained in respiratory distress with increased WOB.  ABG worse; therefore, PCCM asked to see in consultation.  Given work of breathing and worsening ABG, decision made to proceed with intubation.   SUBJECTIVE:   Hyperkalemic 7.5, no widening on EKG, given bicarb, on HCO3 gtt and insulin gtt, 1L NS bolus On fentanyl 200 mcg and Versed 5 mg/hr  Dyssynchrony with vent  VITAL SIGNS: BP 116/74   Pulse (!) 113   Temp 98.7 F (37.1 C) (Oral)   Resp (!) 23   Wt 239 lb 10.2 oz (108.7 kg)   SpO2 98%   BMI 45.28 kg/m   HEMODYNAMICS:   VENTILATOR SETTINGS: Vent Mode: PRVC FiO2 (%):  [50 %] 50 % Set Rate:  [12 bmp-18 bmp] 18 bmp Vt Set:  [380 mL] 380 mL PEEP:  [5 cmH20] 5 cmH20  INTAKE / OUTPUT: I/O last 3 completed shifts: In: 2443 [I.V.:393; IV Piggyback:2050] Out: 110 [Urine:110]   PHYSICAL EXAMINATION: General:  Adult female sedated on MV HEENT: NCAT, ETT Neuro: Sedated, does not follow commands CV: ST 107, s1s2 rrr, no m/r/g PULM: dyssynchrony with MV, diminished throughout, no wheeze GI: obese, soft, non-tender, hypo bs Extremities: warm/dry, no edema  Skin: no rashes or  lesions  LABS:  BMET  Recent Labs Lab 02/18/17 1648 02/19/17 0524  NA 138 137  K 3.0* 7.5*  CL 108 103  CO2 21* 21*  BUN 15 14  CREATININE 1.00 1.73*  GLUCOSE 161* 375*    Electrolytes  Recent Labs Lab 02/18/17 1648 02/19/17 0524  CALCIUM 8.6* 7.8*  MG  --  2.1  PHOS  --  4.6    CBC  Recent Labs Lab 02/18/17 1648 02/19/17 0524  WBC 11.1* 13.1*  HGB 11.5* 10.8*  HCT 35.6* 33.9*  PLT 259 275    Coag's No results for input(s): APTT, INR in the last 168 hours.  Sepsis Markers No results for input(s): LATICACIDVEN, PROCALCITON, O2SATVEN in the last 168 hours.  ABG  Recent Labs Lab 02/19/17 0334 02/19/17 0509 02/19/17 0753  PHART 7.093* 7.065* 7.169*  PCO2ART 44.5 48.9* 52.9*  PO2ART 181.0* 159.0* 155.0*    Liver Enzymes No results for input(s): AST, ALT, ALKPHOS, BILITOT, ALBUMIN in the last 168 hours.  Cardiac Enzymes  Recent Labs Lab 02/19/17 0219 02/19/17 0524  TROPONINI <0.03 <0.03    Glucose  Recent Labs Lab 02/19/17 0320 02/19/17 0439 02/19/17 0550 02/19/17 0621 02/19/17 0725  GLUCAP 362* 384* 388* 349* 358*    Imaging Dg Chest Port 1 View  Result Date: 02/19/2017 CLINICAL DATA:  Acute onset of shortness of breath. Endotracheal tube placement. Initial encounter. EXAM: PORTABLE CHEST 1 VIEW COMPARISON:  Chest radiograph  performed 02/18/2017 FINDINGS: The patient's endotracheal tube is seen ending 3 cm above the carina. An enteric tube is noted extending below the diaphragm. Vascular congestion is noted. Mild left-sided airspace opacities may reflect pneumonia or mild asymmetric interstitial edema. No pleural effusion or pneumothorax is seen. The cardiomediastinal silhouette is mildly enlarged. No acute osseous abnormalities are identified. IMPRESSION: 1. Endotracheal tube seen ending 3 cm above the carina. 2. Vascular congestion and mild cardiomegaly. Mild left-sided airspace opacities may reflect pneumonia or mild asymmetric  interstitial edema. Electronically Signed   By: Garald Balding M.D.   On: 02/19/2017 01:33   Dg Chest Port 1 View  Result Date: 02/18/2017 CLINICAL DATA:  Shortness of breath and cough for 2 days EXAM: PORTABLE CHEST 1 VIEW COMPARISON:  08/22/2015 FINDINGS: The heart size and mediastinal contours are within normal limits. Both lungs are clear. The visualized skeletal structures are unremarkable. IMPRESSION: No active disease. Electronically Signed   By: Inez Catalina M.D.   On: 02/18/2017 16:36     STUDIES:  CXR 5/30 > no acute process.  CULTURES: Sputum 5/30 >  RVP 5/30 >  ANTIBIOTICS: None.  SIGNIFICANT EVENTS: 5/30 > admit for acute asthma exacerbation, intubated  LINES/TUBES: ETT 5/30 >   DISCUSSION: 46 y.o. female admitted 5/30 with asthma exacerbation that failed conservative management and required intubation.  ASSESSMENT / PLAN:  PULMONARY A: Asthma exacerbation - failed conservative treatments, requiring intubation. Vent dysschrony Left upper lobe opacity - infiltrate vs edema Tobacco dependence. P:   Rocuronium x 1 now CAT x 1 now Check RVP Start empiric abx coverage Full vent support Wean FiO2/ PEEP for sats >92% Allow for some permissive hypercapia Monitor for auto-peeping VAP prevention measures. Trend ABG/ CXR D/c brovonna and pulmicort Albuterol q4 and prn q2  CARDIOVASCULAR A:  Chest tightness - presumed due to asthma exacerbation. Initial EKG negative.  Hx HTN, HLD. P:  Trending troponins, neg x 2 Hold preadmission amlodipine, HCTZ, losartan,  Trend lactic acid Place CVL  RENAL A:   AKI  Hypokalemia - s/p repletion in ED. Hyperkalemic- s/p replacement and in the setting of acidosis- mild elevation on Tw[place], no QRS widening P:   S/p Kaxelate, on insulin and HCO3 gtt Ca gluconate 1 gm x1 now Repeat BMET now and at 2000 1 liter NS now If K does not improve, consider Renal consult NS @ 75. Trend BMP / urinary output Replace  electrolytes as indicated Avoid nephrotoxic agents, ensure adequate renal perfusion  GASTROINTESTINAL A:   GERD. Nutrition. P:   SUP: Pantoprazole. NPO.  HEMATOLOGIC A:   VTE Prophylaxis. P:  SCD's / lovenox. Trend CBC   INFECTIOUS A:   Leukocytosis- mild, afebrile P:   Check RVP BC x 2 now Follow cultures Start rocephin Trend WBC/ monitor fever curve  ENDOCRINE A:   DM. Beta-hydroxybutyric acid 0.27 P:   Continue insulin gtt Hold preadmission farxiga, repaglinide, trulicity.  NEUROLOGIC A:   Acute encephalopathy - due to sedation. Hx depression, chronic back pain, migraines. P:   Sedation:  Propofol gtt / Fentanyl PRN. RASS goal: 0 to -1. Hold preadmission zoloft, percocet, gabapentin, robaxin, topamax, sumatriptan.  Family updated: Husband updated 5/31 by Dr. Lake Bells   Interdisciplinary Family Meeting v Palliative Care Meeting:  Due by: 02/24/17.  CC time: 45 min.  [name], AGACNP-BC [facility] Pgr: [phone] or if no answer [phone] 02/19/2017, 8:53 AM

## 2017-02-19 NOTE — Progress Notes (Addendum)
Initial Nutrition Assessment  DOCUMENTATION CODES:   Morbid obesity  INTERVENTION:   Start TF:  Vital High Protein at 60 ml/h (1440 ml per day)  Provides 1440 kcal, 126 gm protein, 1204 ml free water daily  NUTRITION DIAGNOSIS:   Inadequate oral intake related to inability to eat as evidenced by NPO status.  GOAL:   Provide needs based on ASPEN/SCCM guidelines  MONITOR:   Vent status, TF tolerance, Labs, I & O's, Skin  REASON FOR ASSESSMENT:   Ventilator, Rounds (RD to order TF)   ASSESSMENT:   46 yo female with PMH of asthma, HTN, DM, GERD, hypokalemia, smoker, who was admitted on 5/30 with respiratory distress, increased WOB and required intubation.  Discussed patient in ICU rounds and with RN today. Per discussion in rounds, RD to order TF. OGT is in place. Currently on insulin drip. AKI-will likely need CRRT, Renal consult ordered. Patient is currently intubated on ventilator support MV: 7.8 L/min Temp (24hrs), Avg:98.7 F (37.1 C), Min:98.7 F (37.1 C), Max:98.7 F (37.1 C)  Labs reviewed: potassium 6.8 (H) CBG's: 240-236 Medications reviewed and include Solu-medrol.  Diet Order:   NPO  Skin:  Reviewed, no issues  Last BM:  5/31  Height:   Ht Readings from Last 1 Encounters:  02/19/17 5\' 3"  (1.6 m)    Weight:   Wt Readings from Last 1 Encounters:  02/19/17 239 lb 10.2 oz (108.7 kg)    Ideal Body Weight:  52.3 kg  BMI:  Body mass index is 42.45 kg/m.  Estimated Nutritional Needs:   Kcal:  8937-3428  Protein:  130 gm  Fluid:  1.5-1.8 L  EDUCATION NEEDS:   No education needs identified at this time  Molli Barrows, [place], [place], [address] Pager [phone] After Hours Pager [phone]

## 2017-02-19 NOTE — Consult Note (Signed)
[name] is a 46 y.o. female with PMH as outlined below including but not limited to Asthma.  She presented to [facility] ED 5/30 with SOB that began the afternoon prior.   Smokes intermittently for the last 3 years.  She has been hospitalized for asthma multiple times in the past but not required intubation.  She underwent intubation early this AM with associated reduction in BP to 87/50 from a high of 228/116.  She developed acidosis to pH 7.0, hyperkalemia to 7.5 and oliguria. CXR did not show edema. Naprosyn is on her med list, but no ARB or ACE-I.  Renal is asked to assist with metabolic derangement.    Past Medical History:  Diagnosis Date  . Arthritis   . Asthma   . Chronic back pain   . Chronic chest pain   . Depression   . DM (diabetes mellitus) ([place])    INSULIN DEPENDENT  . GERD (gastroesophageal reflux disease)   . Headache(784.0)   . Hypertension   . Hypokalemia    Past Surgical History:  Procedure Laterality Date  . APPENDECTOMY    . CESAREAN SECTION     x 3  . CHOLECYSTECTOMY N/A 03/02/2014   Procedure: LAPAROSCOPIC CHOLECYSTECTOMY;  Surgeon: Joyice Faster. Cornett, MD;  Location: [place];  Service: General;  Laterality: N/A;  . HERNIA REPAIR    . ROTATOR CUFF REPAIR    . VESICOVAGINAL FISTULA CLOSURE W/ TAH  2009   Social History:  reports that she has been smoking Cigarettes.  She has a 5.50 pack-year smoking history. She has never used smokeless tobacco. She reports that she drinks about 0.6 - 1.2 oz of alcohol per week . She reports that she does not use drugs. Allergies:  Allergies  Allergen Reactions  . Reglan [Metoclopramide] Other (See Comments)    Panic attack   Family History  Problem Relation Age of Onset  . Heart attack Mother   . Stroke Mother   . Diabetes Mother   . Hypertension Mother   . Arthritis Mother   . Stroke Father   . Hypertension Sister   . Diabetes Sister   . Seizures Brother   . Diabetes Brother     Medications:  Prior to  Admission:  Prescriptions Prior to Admission  Medication Sig Dispense Refill Last Dose  . albuterol (PROVENTIL HFA;VENTOLIN HFA) 108 (90 Base) MCG/ACT inhaler Inhale 2 puffs into the lungs every 6 (six) hours as needed for wheezing or shortness of breath. Reported on 02/22/2016 1 each 3 02/18/2017 at Unknown time  . albuterol (PROVENTIL) (2.5 MG/3ML) 0.083% nebulizer solution Take 3 mLs (2.5 mg total) by nebulization every 6 (six) hours as needed for wheezing or shortness of breath. Reported on 02/22/2016 75 mL 3 02/18/2017 at Unknown time  . amLODipine (NORVASC) 5 MG tablet Take 1 tablet (5 mg total) by mouth daily. 90 tablet 1 Unknown at Unknown  . aspirin EC 325 MG tablet Take 325 mg by mouth daily.   Unknown at Unknown  . bromocriptine (PARLODEL) 2.5 MG tablet 1/4 tab daily (Patient taking differently: Take 0.625 mg by mouth daily. ) 20 tablet 2 Unknown at Unknown  . dapagliflozin propanediol (FARXIGA) 10 MG TABS tablet Take 10 mg by mouth daily. 30 tablet 11 Unknown at Unknown  . Dulaglutide (TRULICITY) 1.5 XL/2.4MW SOPN Inject 1.5 mg into the skin once a week. 4 pen 11 Unknown at [place]  . Fluticasone-Salmeterol (ADVAIR) 250-50 MCG/DOSE AEPB Inhale 1 puff into the lungs 2 times  daily at 12 noon and 4 pm. 60 each 3 Unknown at Unknown  . gabapentin (NEURONTIN) 300 MG capsule Take 1 capsule (300 mg total) by mouth 3 (three) times daily. 90 capsule 3 Unknown at Unknown  . hydrochlorothiazide (HYDRODIURIL) 25 MG tablet Take 1 tablet (25 mg total) by mouth daily. 30 tablet 4 Unknown at Unknown  . losartan (COZAAR) 100 MG tablet Take 1 tablet (100 mg total) by mouth daily. For Blood pressure - must have office visit for refills 30 tablet 3 Unknown at Unknown  . methocarbamol (ROBAXIN) 500 MG tablet Take 1 tablet (500 mg total) by mouth every 8 (eight) hours as needed for muscle spasms. 90 tablet 1 PRN  . naproxen (NAPROSYN) 500 MG tablet Take 1 tablet (500 mg total) by mouth 2 (two) times daily with a meal.  60 tablet 1 Unknown at Unknown  . olopatadine (PATANOL) 0.1 % ophthalmic solution Place 1 drop into the right eye 2 (two) times daily. 5 mL 0 Unknown at Unknown  . oxyCODONE-acetaminophen (PERCOCET) 5-325 MG tablet Take 1-2 tablets by mouth every 4 (four) hours as needed. (Patient taking differently: Take 1-2 tablets by mouth every 4 (four) hours as needed for moderate pain. ) 20 tablet 0 PRN  . pantoprazole (PROTONIX) 40 MG tablet Take 1 tablet (40 mg total) by mouth daily. 30 tablet 3 Unknown at Unknown  . prochlorperazine (COMPAZINE) 10 MG tablet Take 1 tablet (10 mg total) by mouth 2 (two) times daily as needed for nausea or vomiting. 10 tablet 0 PRN  . repaglinide (PRANDIN) 2 MG tablet 1 tab with breakfast and lunch, and 2 tabs with dinner. (Patient taking differently: Take 2-4 mg by mouth See admin instructions. Take 2 mg by mouth with breakfast and lunch, and 4 mg by mouth with dinner.) 120 tablet 11 Unknown at Unknown  . sertraline (ZOLOFT) 50 MG tablet Take 1 tablet (50 mg total) by mouth daily. 30 tablet 3 Unknown at Unknown  . simvastatin (ZOCOR) 10 MG tablet Take 1 tablet (10 mg total) by mouth daily at 6 PM. 30 tablet 3 Unknown at Unknown  . SUMAtriptan (IMITREX) 100 MG tablet TAKE 1TAB AT EARLIEST ONSET OF HEADACHE. MAY REPEAT X1 IN 2 HOURS IF HEADACHE PERSISTS OR RECURS. DO NOT EXCEED 2 TABS IN 24HRS 9 tablet 2 PRN  . topiramate (TOPAMAX) 100 MG tablet Take 1 tablet (100 mg total) by mouth 2 (two) times daily. 60 tablet 3 Unknown at Unknown  . acetaminophen-codeine (TYLENOL #3) 300-30 MG tablet Take 1 tablet by mouth every 12 (twelve) hours as needed for moderate pain. 30 tablet 0 PRN  . Blood Glucose Monitoring Suppl (ACCU-CHEK AVIVA PLUS) w/Device KIT Use to check blood sugar 3 times per day. DX CODE: E11.9 (Patient not taking: Reported on 02/19/2017) 1 kit 2 Not Taking at Unknown time  . glucose blood (ACCU-CHEK AVIVA PLUS) test strip Use to check blood sugar 3 times per day.DX Code  E11.9 (Patient not taking: Reported on 02/19/2017) 100 each 12 Not Taking at Unknown time  . Insulin Pen Needle 32G X 4 MM MISC Use to inject insulin 3 times per day. (Patient not taking: Reported on 02/19/2017) 100 each 2 Not Taking at Unknown time  . Lancet Devices (ACCU-CHEK SOFTCLIX) lancets Use to check blood sugar 3 times per day dx code E11.9 (Patient not taking: Reported on 02/19/2017) 100 each 2 Not Taking at Unknown time  . Lancets (ACCU-CHEK MULTICLIX) lancets Use to check blood sugar 3 times  per day. DX code E11.9 (Patient not taking: Reported on 02/19/2017) 100 each 12 Not Taking at Unknown time   Scheduled: . albuterol  2.5 mg Nebulization Q4H  . artificial tears  1 application Both Eyes T4H  . aspirin  325 mg Per Tube Daily  . chlorhexidine gluconate (MEDLINE KIT)  15 mL Mouth Rinse BID  . cisatracurium  2.5 mg Intravenous Once  . enoxaparin (LOVENOX) injection  55 mg Subcutaneous Q24H  . feeding supplement (VITAL HIGH PROTEIN)  1,000 mL Per Tube Q24H  . fentaNYL (SUBLIMAZE) injection  100 mcg Intravenous Once  . mouth rinse  15 mL Mouth Rinse QID  . methylPREDNISolone (SOLU-MEDROL) injection  60 mg Intravenous Q6H  . midazolam  2 mg Intravenous Once  . olopatadine  1 drop Right Eye BID  . pantoprazole sodium  40 mg Per Tube QHS  . potassium chloride  40 mEq Per Tube Once  . simvastatin  10 mg Per Tube q1800  . topiramate  100 mg Oral BID     ROS: not obtainable Blood pressure 130/77, pulse (!) 124, temperature 98.7 F (37.1 C), temperature source Oral, resp. rate 18, height 5' 3"  (1.6 m), weight 108.7 kg (239 lb 10.2 oz), SpO2 99 %.  General appearance: paralyzed on vent Head: Normocephalic, without obvious abnormality, atraumatic Throat:oral intub Resp: clear to auscultation bilaterally Chest wall: no tenderness Cardio: regular rate and rhythm, S1, S2 normal, no murmur, click, rub or gallop GI: soft, non-tender; bowel sounds normal; no masses,  no  organomegaly Extremities: edema 1+ Skin: Skin color, texture, turgor normal. No rashes or lesions Neurologic: oparalyzed Results for orders placed or performed during the hospital encounter of 02/18/17 (from the past 48 hour(s))  CBC     Status: Abnormal   Collection Time: 02/18/17  4:48 PM  Result Value Ref Range   WBC 11.1 (H) 4.0 - 10.5 K/uL   RBC 4.08 3.87 - 5.11 MIL/uL   Hemoglobin 11.5 (L) 12.0 - 15.0 g/dL   HCT 35.6 (L) 36.0 - 46.0 %   MCV 87.3 78.0 - 100.0 fL   MCH 28.2 26.0 - 34.0 pg   MCHC 32.3 30.0 - 36.0 g/dL   RDW 14.2 11.5 - 15.5 %   Platelets 259 150 - 400 K/uL  Basic metabolic panel     Status: Abnormal   Collection Time: 02/18/17  4:48 PM  Result Value Ref Range   Sodium 138 135 - 145 mmol/L   Potassium 3.0 (L) 3.5 - 5.1 mmol/L   Chloride 108 101 - 111 mmol/L   CO2 21 (L) 22 - 32 mmol/L   Glucose, Bld 161 (H) 65 - 99 mg/dL   BUN 15 6 - 20 mg/dL   Creatinine, Ser 1.00 0.44 - 1.00 mg/dL   Calcium 8.6 (L) 8.9 - 10.3 mg/dL   GFR calc non Af Amer >60 >60 mL/min   GFR calc Af Amer >60 >60 mL/min    Comment: (NOTE) The eGFR has been calculated using the CKD EPI equation. This calculation has not been validated in all clinical situations. eGFR's persistently <60 mL/min signify possible Chronic Kidney Disease.    Anion gap 9 5 - 15  I-Stat Arterial Blood Gas, ED - (order at [facility] and [facility] only)     Status: Abnormal   Collection Time: 02/18/17  7:38 PM  Result Value Ref Range   pH, Arterial 7.367 7.350 - 7.450   pCO2 arterial 28.7 (L) 32.0 - 48.0 mmHg   pO2, Arterial  71.0 (L) 83.0 - 108.0 mmHg   Bicarbonate 16.5 (L) 20.0 - 28.0 mmol/L   TCO2 17 0 - 100 mmol/L   O2 Saturation 94.0 %   Acid-base deficit 8.0 (H) 0.0 - 2.0 mmol/L   Patient temperature 98.6 F    Collection site RADIAL, ALLEN'S TEST ACCEPTABLE    Drawn by RT    Sample type ARTERIAL   I-Stat arterial blood gas, ED     Status: Abnormal   Collection Time: 02/19/17 12:24 AM  Result Value Ref Range   pH,  Arterial 7.266 (L) 7.350 - 7.450   pCO2 arterial 23.1 (L) 32.0 - 48.0 mmHg   pO2, Arterial 99.0 83.0 - 108.0 mmHg   Bicarbonate 10.5 (L) 20.0 - 28.0 mmol/L   TCO2 11 0 - 100 mmol/L   O2 Saturation 97.0 %   Acid-base deficit 15.0 (H) 0.0 - 2.0 mmol/L   Patient temperature 98.6 F    Collection site RADIAL, ALLEN'S TEST ACCEPTABLE    Drawn by RT    Sample type ARTERIAL   HIV antibody (Routine Testing)     Status: None   Collection Time: 02/19/17 12:46 AM  Result Value Ref Range   HIV Screen 4th Generation wRfx Non Reactive Non Reactive    Comment: (NOTE) Performed At: [facility] [place], [place] [id number] Lindon Romp MD VP:[id number]   Beta-hydroxybutyric acid     Status: None   Collection Time: 02/19/17 12:57 AM  Result Value Ref Range   Beta-Hydroxybutyric Acid 0.27 0.05 - 0.27 mmol/L  Brain natriuretic peptide     Status: None   Collection Time: 02/19/17  2:19 AM  Result Value Ref Range   B Natriuretic Peptide 54.8 0.0 - 100.0 pg/mL  Troponin I     Status: None   Collection Time: 02/19/17  2:19 AM  Result Value Ref Range   Troponin I <0.03 <0.03 ng/mL  CBG monitoring, ED     Status: Abnormal   Collection Time: 02/19/17  3:20 AM  Result Value Ref Range   Glucose-Capillary 362 (H) 65 - 99 mg/dL  I-Stat arterial blood gas, ED     Status: Abnormal   Collection Time: 02/19/17  3:34 AM  Result Value Ref Range   pH, Arterial 7.093 (LL) 7.350 - 7.450   pCO2 arterial 44.5 32.0 - 48.0 mmHg   pO2, Arterial 181.0 (H) 83.0 - 108.0 mmHg   Bicarbonate 13.6 (L) 20.0 - 28.0 mmol/L   TCO2 15 0 - 100 mmol/L   O2 Saturation 99.0 %   Acid-base deficit 16.0 (H) 0.0 - 2.0 mmol/L   Patient temperature 98.6 F    Collection site RADIAL, ALLEN'S TEST ACCEPTABLE    Drawn by RT    Sample type ARTERIAL    Comment NOTIFIED PHYSICIAN   MRSA PCR Screening     Status: None   Collection Time: 02/19/17  4:30 AM  Result Value Ref Range   MRSA by PCR NEGATIVE NEGATIVE     Comment:        The GeneXpert MRSA Assay (FDA approved for NASAL specimens only), is one component of a comprehensive MRSA colonization surveillance program. It is not intended to diagnose MRSA infection nor to guide or monitor treatment for MRSA infections.   Glucose, capillary     Status: Abnormal   Collection Time: 02/19/17  4:39 AM  Result Value Ref Range   Glucose-Capillary 384 (H) 65 - 99 mg/dL   Comment 1 Notify RN  I-STAT 3, arterial blood gas (G3+)     Status: Abnormal   Collection Time: 02/19/17  5:09 AM  Result Value Ref Range   pH, Arterial 7.065 (LL) 7.350 - 7.450   pCO2 arterial 48.9 (H) 32.0 - 48.0 mmHg   pO2, Arterial 159.0 (H) 83.0 - 108.0 mmHg   Bicarbonate 14.0 (L) 20.0 - 28.0 mmol/L   TCO2 16 0 - 100 mmol/L   O2 Saturation 98.0 %   Acid-base deficit 16.0 (H) 0.0 - 2.0 mmol/L   Patient temperature 98.3 F    Collection site RADIAL, ALLEN'S TEST ACCEPTABLE    Drawn by RT    Sample type ARTERIAL    Comment NOTIFIED PHYSICIAN   Troponin I     Status: None   Collection Time: 02/19/17  5:24 AM  Result Value Ref Range   Troponin I <0.03 <0.03 ng/mL  Basic metabolic panel     Status: Abnormal   Collection Time: 02/19/17  5:24 AM  Result Value Ref Range   Sodium 137 135 - 145 mmol/L   Potassium 7.5 (HH) 3.5 - 5.1 mmol/L    Comment: REPEATED TO VERIFY NO VISIBLE HEMOLYSIS CRITICAL RESULT CALLED TO, READ BACK BY AND VERIFIED WITH: A MINOR,RN [phone] WILDERK    Chloride 103 101 - 111 mmol/L   CO2 21 (L) 22 - 32 mmol/L   Glucose, Bld 375 (H) 65 - 99 mg/dL   BUN 14 6 - 20 mg/dL   Creatinine, Ser 1.73 (H) 0.44 - 1.00 mg/dL   Calcium 7.8 (L) 8.9 - 10.3 mg/dL   GFR calc non Af Amer 34 (L) >60 mL/min   GFR calc Af Amer 39 (L) >60 mL/min    Comment: (NOTE) The eGFR has been calculated using the CKD EPI equation. This calculation has not been validated in all clinical situations. eGFR's persistently <60 mL/min signify possible Chronic Kidney Disease.     Anion gap 13 5 - 15  Magnesium     Status: None   Collection Time: 02/19/17  5:24 AM  Result Value Ref Range   Magnesium 2.1 1.7 - 2.4 mg/dL  CBC     Status: Abnormal   Collection Time: 02/19/17  5:24 AM  Result Value Ref Range   WBC 13.1 (H) 4.0 - 10.5 K/uL   RBC 3.82 (L) 3.87 - 5.11 MIL/uL   Hemoglobin 10.8 (L) 12.0 - 15.0 g/dL   HCT 33.9 (L) 36.0 - 46.0 %   MCV 88.7 78.0 - 100.0 fL   MCH 28.3 26.0 - 34.0 pg   MCHC 31.9 30.0 - 36.0 g/dL   RDW 14.7 11.5 - 15.5 %   Platelets 275 150 - 400 K/uL  Phosphorus     Status: None   Collection Time: 02/19/17  5:24 AM  Result Value Ref Range   Phosphorus 4.6 2.5 - 4.6 mg/dL  Glucose, capillary     Status: Abnormal   Collection Time: 02/19/17  5:50 AM  Result Value Ref Range   Glucose-Capillary 388 (H) 65 - 99 mg/dL  Glucose, capillary     Status: Abnormal   Collection Time: 02/19/17  6:21 AM  Result Value Ref Range   Glucose-Capillary 349 (H) 65 - 99 mg/dL   Comment 1 Notify RN   Glucose, capillary     Status: Abnormal   Collection Time: 02/19/17  7:25 AM  Result Value Ref Range   Glucose-Capillary 358 (H) 65 - 99 mg/dL   Comment 1 Capillary Specimen   I-STAT 3,  arterial blood gas (G3+)     Status: Abnormal   Collection Time: 02/19/17  7:53 AM  Result Value Ref Range   pH, Arterial 7.169 (LL) 7.350 - 7.450   pCO2 arterial 52.9 (H) 32.0 - 48.0 mmHg   pO2, Arterial 155.0 (H) 83.0 - 108.0 mmHg   Bicarbonate 19.2 (L) 20.0 - 28.0 mmol/L   TCO2 21 0 - 100 mmol/L   O2 Saturation 99.0 %   Acid-base deficit 9.0 (H) 0.0 - 2.0 mmol/L   Patient temperature HIDE    Sample type ARTERIAL    Comment NOTIFIED PHYSICIAN   Glucose, capillary     Status: Abnormal   Collection Time: 02/19/17  8:38 AM  Result Value Ref Range   Glucose-Capillary 315 (H) 65 - 99 mg/dL   Comment 1 Capillary Specimen   Glucose, capillary     Status: Abnormal   Collection Time: 02/19/17  9:42 AM  Result Value Ref Range   Glucose-Capillary 240 (H) 65 - 99 mg/dL    Comment 1 Capillary Specimen   Troponin I     Status: Abnormal   Collection Time: 02/19/17  9:58 AM  Result Value Ref Range   Troponin I 0.11 (HH) <0.03 ng/mL    Comment: CRITICAL RESULT CALLED TO, READ BACK BY AND VERIFIED WITH: PARKS,D RN @ 3016 02/19/17 LEONARD,A   D-dimer, quantitative (not at [facility])     Status: Abnormal   Collection Time: 02/19/17  9:58 AM  Result Value Ref Range   D-Dimer, Quant 0.63 (H) 0.00 - 0.50 ug/mL-FEU    Comment: (NOTE) At the manufacturer cut-off of 0.50 ug/mL FEU, this assay has been documented to exclude PE with a sensitivity and negative predictive value of 97 to 99%.  At this time, this assay has not been approved by the FDA to exclude DVT/VTE. Results should be correlated with clinical presentation.   Basic metabolic panel     Status: Abnormal   Collection Time: 02/19/17  9:58 AM  Result Value Ref Range   Sodium 136 135 - 145 mmol/L   Potassium 6.8 (HH) 3.5 - 5.1 mmol/L    Comment: CRITICAL RESULT CALLED TO, READ BACK BY AND VERIFIED WITH: BAYNES,I RN @ 1045 02/19/17 LEONARD,A    Chloride 109 101 - 111 mmol/L   CO2 18 (L) 22 - 32 mmol/L   Glucose, Bld 237 (H) 65 - 99 mg/dL   BUN 16 6 - 20 mg/dL   Creatinine, Ser 2.16 (H) 0.44 - 1.00 mg/dL   Calcium 7.9 (L) 8.9 - 10.3 mg/dL   GFR calc non Af Amer 26 (L) >60 mL/min   GFR calc Af Amer 30 (L) >60 mL/min    Comment: (NOTE) The eGFR has been calculated using the CKD EPI equation. This calculation has not been validated in all clinical situations. eGFR's persistently <60 mL/min signify possible Chronic Kidney Disease.    Anion gap 9 5 - 15  Glucose, capillary     Status: Abnormal   Collection Time: 02/19/17 10:50 AM  Result Value Ref Range   Glucose-Capillary 236 (H) 65 - 99 mg/dL   Comment 1 Capillary Specimen   Respiratory Panel by PCR     Status: Abnormal   Collection Time: 02/19/17 11:52 AM  Result Value Ref Range   Adenovirus NOT DETECTED NOT DETECTED   Coronavirus 229E NOT  DETECTED NOT DETECTED   Coronavirus HKU1 NOT DETECTED NOT DETECTED   Coronavirus NL63 NOT DETECTED NOT DETECTED   Coronavirus OC43 NOT DETECTED NOT  DETECTED   Metapneumovirus NOT DETECTED NOT DETECTED   Rhinovirus / Enterovirus DETECTED (A) NOT DETECTED   Influenza A NOT DETECTED NOT DETECTED   Influenza B NOT DETECTED NOT DETECTED   Parainfluenza Virus 1 NOT DETECTED NOT DETECTED   Parainfluenza Virus 2 NOT DETECTED NOT DETECTED   Parainfluenza Virus 3 NOT DETECTED NOT DETECTED   Parainfluenza Virus 4 NOT DETECTED NOT DETECTED   Respiratory Syncytial Virus NOT DETECTED NOT DETECTED   Bordetella pertussis NOT DETECTED NOT DETECTED   Chlamydophila pneumoniae NOT DETECTED NOT DETECTED   Mycoplasma pneumoniae NOT DETECTED NOT DETECTED  Glucose, capillary     Status: Abnormal   Collection Time: 02/19/17 11:58 AM  Result Value Ref Range   Glucose-Capillary 179 (H) 65 - 99 mg/dL  I-STAT 3, arterial blood gas (G3+)     Status: Abnormal   Collection Time: 02/19/17 12:07 PM  Result Value Ref Range   pH, Arterial 7.139 (LL) 7.350 - 7.450   pCO2 arterial 60.2 (H) 32.0 - 48.0 mmHg   pO2, Arterial 116.0 (H) 83.0 - 108.0 mmHg   Bicarbonate 20.4 20.0 - 28.0 mmol/L   TCO2 22 0 - 100 mmol/L   O2 Saturation 97.0 %   Acid-base deficit 9.0 (H) 0.0 - 2.0 mmol/L   Patient temperature 98.7 F    Collection site RADIAL, ALLEN'S TEST ACCEPTABLE    Drawn by Operator    Sample type ARTERIAL    Comment NOTIFIED PHYSICIAN   Urinalysis, Routine w reflex microscopic     Status: Abnormal   Collection Time: 02/19/17  1:06 PM  Result Value Ref Range   Color, Urine YELLOW YELLOW   APPearance CLOUDY (A) CLEAR   Specific Gravity, Urine 1.016 1.005 - 1.030   pH 5.0 5.0 - 8.0   Glucose, UA 50 (A) NEGATIVE mg/dL   Hgb urine dipstick MODERATE (A) NEGATIVE   Bilirubin Urine NEGATIVE NEGATIVE   Ketones, ur NEGATIVE NEGATIVE mg/dL   Protein, ur 100 (A) NEGATIVE mg/dL   Nitrite NEGATIVE NEGATIVE   Leukocytes, UA  MODERATE (A) NEGATIVE   RBC / HPF TOO NUMEROUS TO COUNT 0 - 5 RBC/hpf   WBC, UA TOO NUMEROUS TO COUNT 0 - 5 WBC/hpf   Bacteria, UA FEW (A) NONE SEEN   Squamous Epithelial / LPF 6-30 (A) NONE SEEN   WBC Clumps PRESENT    Mucous PRESENT    Granular Casts, UA PRESENT   Glucose, capillary     Status: Abnormal   Collection Time: 02/19/17  1:45 PM  Result Value Ref Range   Glucose-Capillary 175 (H) 65 - 99 mg/dL   Comment 1 Capillary Specimen   Glucose, capillary     Status: Abnormal   Collection Time: 02/19/17  3:03 PM  Result Value Ref Range   Glucose-Capillary 167 (H) 65 - 99 mg/dL   Comment 1 Capillary Specimen    Dg Chest Port 1 View  Result Date: 02/19/2017 CLINICAL DATA:  Status post central line placement EXAM: PORTABLE CHEST 1 VIEW COMPARISON:  02/19/2017 FINDINGS: Cardiac shadow remains enlarged. Endotracheal tube and nasogastric catheter are noted in satisfactory position. A right jugular central line is noted in the mid superior vena cava. No pneumothorax is seen. The overall inspiratory effort is poor with crowding vascular markings. No focal infiltrate is seen. IMPRESSION: No pneumothorax following central line placement. Electronically Signed   By: Inez Catalina M.D.   On: 02/19/2017 14:47   Dg Chest Port 1 View  Result Date: 02/19/2017 CLINICAL DATA:  Acute onset of shortness of breath. Endotracheal tube placement. Initial encounter. EXAM: PORTABLE CHEST 1 VIEW COMPARISON:  Chest radiograph performed 02/18/2017 FINDINGS: The patient's endotracheal tube is seen ending 3 cm above the carina. An enteric tube is noted extending below the diaphragm. Vascular congestion is noted. Mild left-sided airspace opacities may reflect pneumonia or mild asymmetric interstitial edema. No pleural effusion or pneumothorax is seen. The cardiomediastinal silhouette is mildly enlarged. No acute osseous abnormalities are identified. IMPRESSION: 1. Endotracheal tube seen ending 3 cm above the carina. 2.  Vascular congestion and mild cardiomegaly. Mild left-sided airspace opacities may reflect pneumonia or mild asymmetric interstitial edema. Electronically Signed   By: Garald Balding M.D.   On: 02/19/2017 01:33   Dg Chest Port 1 View  Result Date: 02/18/2017 CLINICAL DATA:  Shortness of breath and cough for 2 days EXAM: PORTABLE CHEST 1 VIEW COMPARISON:  08/22/2015 FINDINGS: The heart size and mediastinal contours are within normal limits. Both lungs are clear. The visualized skeletal structures are unremarkable. IMPRESSION: No active disease. Electronically Signed   By: Inez Catalina M.D.   On: 02/18/2017 16:36    Assessment:  1 AKI, hemodynamically mediated 2 Acidosis 3 Hyperkalemia 4 VDRF, asthma 5 Hypertension  Plan: 1 CRRT to assist with acid/base and potassium management.  2 Allow BP to drift upward  Anwar Sakata C 02/19/2017, 3:13 PM

## 2017-02-19 NOTE — Progress Notes (Signed)
[facility] PCCM  I have seen and examined the patient with nurse practitioner/resident and agree with the note above.  We formulated the plan together and I elicited the following history.    Admitted overnight for asthma exacerbation requiring intubation.  Had a sick contact at home with a viral illness then developed worsening dyspnea.  Smokes about 1/2 ppd, takes Advair (husband says she is compliant) and albuterl prn.  He says her last attack was about 2 years ago.  Overnight required intubation, dyssynchrony and hyperkalemia   On exam Vitals:   02/19/17 0800 02/19/17 0801 02/19/17 0802 02/19/17 0900  BP: 111/81   119/87  Pulse: (!) 102   (!) 103  Resp: 10   (!) 9  Temp:      TempSrc:      SpO2: 99% 99% 98% 98%  Weight:      Height:   5\' 3"  (1.6 m)    Vent Mode: PRVC FiO2 (%):  [50 %] 50 % Set Rate:  [12 bmp-18 bmp] 18 bmp Vt Set:  [380 mL] 380 mL PEEP:  [5 cmH20] 5 cmH20   General:  Sedated on vent HENT: NCAT ETT in place PULM: Poor air movement, vent supported breathing CV: RRR, no mgr GI: BS+, soft, nontender MSK: normal bulk and tone Neuro: heavily sedated on vent  BMET    Component Value Date/Time   NA 137 02/19/2017 0524   NA 144 01/05/2017 1025   K 7.5 (HH) 02/19/2017 0524   CL 103 02/19/2017 0524   CO2 21 (L) 02/19/2017 0524   GLUCOSE 375 (H) 02/19/2017 0524   BUN 14 02/19/2017 0524   BUN 15 01/05/2017 1025   CREATININE 1.73 (H) 02/19/2017 0524   CREATININE 0.93 08/01/2015 1202   CALCIUM 7.8 (L) 02/19/2017 0524   GFRNONAA 34 (L) 02/19/2017 0524   GFRAA 39 (L) 02/19/2017 0524   CBC    Component Value Date/Time   WBC 13.1 (H) 02/19/2017 0524   RBC 3.82 (L) 02/19/2017 0524   HGB 10.8 (L) 02/19/2017 0524   HCT 33.9 (L) 02/19/2017 0524   PLT 275 02/19/2017 0524   MCV 88.7 02/19/2017 0524   MCH 28.3 02/19/2017 0524   MCHC 31.9 02/19/2017 0524   RDW 14.7 02/19/2017 0524   LYMPHSABS 3.9 08/21/2016 0956   MONOABS 0.6 08/21/2016 0956   EOSABS 0.1 08/21/2016  0956   BASOSABS 0.0 08/21/2016 0956   CXR > endotracheal tube in place, new left upper lobe infiltrate? Images independently reviewed  Impression/plan:  Acute respiratory failure with hypercapnea> due to asthma exacerbation (severe) now with persistent hypercapnea and severe vent dyssynchrony, pCO2 improving but this morning she is very non-compliant with the vent.  Will continue heavy sedation, give one dose of paralytic with rocuronium now, continue PRVC ventilation, no role for adjusting the vent now, needs permissive hypercapnea to avoid barotrauma, continue solumedrol, change brovana and pulmicort to albuterol q4h and prn, give albuterol continous x1hr now  Hyperkalemia with AKI> liter bolus now, calicum now, repeat now, may need renal involvement, hopefully with improvement in pH will see improvement in K  Add antibiotics  Rest as per NP note  Updated family at length at bedside this morning  CCM time by me 35 minutes  Roselie Awkward, MD [place] [facility] Pager: [phone] Cell: [phone] After 3pm or if no response, call [phone]

## 2017-02-19 NOTE — Progress Notes (Signed)
[place] Progress Note Patient Name: Marnette Perkins [name] DOB: 07-02-1971 MRN: [id number]   Date of Service  02/19/2017  HPI/Events of Note  Intubated and ventilated - Request for Foley Catheter.   eICU Interventions  Will place Foley Catheter.      Intervention Category Intermediate Interventions: Other:  Sommer,Steven Cornelia Copa 02/19/2017, 2:48 AM

## 2017-02-19 NOTE — Progress Notes (Signed)
[facility] Progress Note Patient Name: [name] DOB: 09/06/1971 MRN: [id number]   Date of Service  02/19/2017  HPI/Events of Note  Hyperkalemia - K+ = 7.5. No evidence of hemolysis. EKG not widened on bedside monitor.   eICU Interventions  Will order: 1. Kayexalate 30 gm per tube now. 2. Repeat BMP at 12 noon.      Intervention Category Major Interventions: Electrolyte abnormality - evaluation and management  Sommer,Steven Eugene 02/19/2017, 7:02 AM

## 2017-02-19 NOTE — Progress Notes (Addendum)
[place] Progress Note Patient Name: Kathyleen Radice [name] DOB: 05-Jan-1971 MRN: [id number]   Date of Service  02/19/2017  HPI/Events of Note  Multiple issues: 1. Hypotension - BP = 86/59 and 2. Blood glucose = 250 >> 362 >> 384.  eICU Interventions  Will order: 1. Bolus with 0.9 NaCl 1 liter IV over 1 hour now.  2. D/C Moderate Novolog SSI. 3. Insulin IV infusion.  4. Versed IV infusion. Titrate to RASS = 0 to -1. 5. Wean Propofol IV infusion as tolerated when Versed IV infusio is up and running.      Intervention Category Major Interventions: Hypotension - evaluation and management  Brenan Modesto Eugene 02/19/2017, 5:58 AM

## 2017-02-19 NOTE — Procedures (Signed)
Central Venous Hemodialysis Catheter Insertion Procedure Note [name] [id number] 01-12-1971  Procedure: Insertion of Central Venous Hemodialysis Catheter Indications: Assessment of intravascular volume, Drug and/or fluid administration, Frequent blood sampling and Hemodialysis   Procedure Details Consent: Risks of procedure as well as the alternatives and risks of each were explained to the (patient/caregiver).  Consent for procedure obtained. Time Out: Verified patient identification, verified procedure, site/side was marked, verified correct patient position, special equipment/implants available, medications/allergies/relevent history reviewed, required imaging and test results available.  Performed  Maximum sterile technique was used including antiseptics, cap, gloves, gown, hand hygiene, mask and sheet. Skin prep: Chlorhexidine; local anesthetic administered A antimicrobial bonded/coated triple lumen catheter was placed in the right internal jugular vein using the Seldinger technique.  Biopatch applied and line sutured. Blue and red ports instilled with heparin 1.27ml each.  RN verbalizes understanding.   Evaluation Blood flow good Complications: No apparent complications Patient did tolerate procedure well. Chest X-ray ordered to verify placement.  CXR: pending.  Procedure performed with ultrasound guidance for real time vessel cannulation.     [name], AGACNP-BC [facility] Pgr: [phone] or if no answer [phone] 02/19/2017, 1:38 PM

## 2017-02-19 NOTE — Procedures (Signed)
ABG results given to CCM NP, no new orders at this time.

## 2017-02-19 NOTE — Progress Notes (Signed)
[place] Progress Note Patient Name: [name] DOB: September 27, 1970 MRN: [id number]   Date of Service  02/19/2017  HPI/Events of Note  ABG on 50%/PRVC 18/TV 380/P 5 = 7.06/48/159/14  eICU Interventions  Will order: 1. NaHCO3 100 meq IV now. 2. NaHCO3 IV infusion at 75 mL/hour. 3. ABG at 7:30 AM.     Intervention Category Major Interventions: Acid-Base disturbance - evaluation and management;Respiratory failure - evaluation and management  [name] 02/19/2017, 5:15 AM

## 2017-02-19 NOTE — Progress Notes (Signed)
[facility] PCCM  Worsening renal failure Persistent hyperkalemia Oliguria Paralytics helped earlier, now worn off Will need nimbex, try to stop ASAP given risk of neuromuscular weakness Will need CVVHD, have consulted renal, will place HD cath Family updated  Roselie Awkward, MD [place] [facility] Pager: [phone] Cell: [phone] After 3pm or if no response, call [phone]

## 2017-02-19 NOTE — Progress Notes (Signed)
[facility] Progress Note Patient Name: [name] DOB: 01-05-1971 MRN: [id number]   Date of Service  02/19/2017  HPI/Events of Note  ABG on 60%/PRVC 12/TV 380/P 5 = 7.09/44.5/181/13.6. She doesn't appear to be air trapping on a PRVC rate = 18. Ppeak = 24.  eICU Interventions  Will order: 1. Increase PRBC rate to 18. 2. ABG at 5 AM.     Intervention Category Major Interventions: Acid-Base disturbance - evaluation and management;Respiratory failure - evaluation and management  Philbert Ocallaghan Eugene 02/19/2017, 4:01 AM

## 2017-02-19 NOTE — Progress Notes (Signed)
[facility] Progress Note Patient Name: Katelind Pytel [name] DOB: 07-05-1971 MRN: [id number]   Date of Service  02/19/2017  HPI/Events of Note  Notified by bedside nurse that potassium on recent basic metabolic panel was directly from hemodialysis machine and not peripheral.   eICU Interventions  1. Discontinuing previously ordered potassium 2. Adjusting fentanyl drip to match current infusion rate of 400 g per hour      Intervention Category Major Interventions: Electrolyte abnormality - evaluation and management  Tera Partridge 02/19/2017, 9:40 PM

## 2017-02-20 ENCOUNTER — Inpatient Hospital Stay (HOSPITAL_COMMUNITY): Payer: Medicare Other

## 2017-02-20 DIAGNOSIS — Z930 Tracheostomy status: Secondary | ICD-10-CM

## 2017-02-20 HISTORY — PX: TRACHEOSTOMY: SUR1362

## 2017-02-20 HISTORY — DX: Tracheostomy status: Z93.0

## 2017-02-20 LAB — TROPONIN I
Troponin I: 0.75 ng/mL (ref <0.03)
Troponin I: 0.61 ng/mL (ref <0.03)

## 2017-02-20 LAB — BLOOD GAS, ARTERIAL
ACID-BASE EXCESS: 9.6 mmol/L — AB (ref 0.0–2.0)
Bicarbonate: 35 mmol/L — ABNORMAL HIGH (ref 20.0–28.0)
Drawn by: 358491
FIO2: 50
LHR: 18 {breaths}/min
O2 SAT: 96.9 %
PATIENT TEMPERATURE: 98.6
PCO2 ART: 62 mmHg — AB (ref 32.0–48.0)
PEEP/CPAP: 5 cmH2O
PH ART: 7.371 (ref 7.350–7.450)
VT: 350 mL
pO2, Arterial: 91.3 mmHg (ref 83.0–108.0)

## 2017-02-20 LAB — POCT I-STAT, CHEM 8
BUN: 11 mg/dL (ref 6–20)
BUN: 15 mg/dL (ref 6–20)
BUN: 16 mg/dL (ref 6–20)
CALCIUM ION: 0.48 mmol/L — AB (ref 1.15–1.40)
CALCIUM ION: 1.07 mmol/L — AB (ref 1.15–1.40)
CALCIUM ION: 1.07 mmol/L — AB (ref 1.15–1.40)
CREATININE: 1.1 mg/dL — AB (ref 0.44–1.00)
Chloride: 92 mmol/L — ABNORMAL LOW (ref 101–111)
Chloride: 92 mmol/L — ABNORMAL LOW (ref 101–111)
Chloride: 97 mmol/L — ABNORMAL LOW (ref 101–111)
Creatinine, Ser: 0.7 mg/dL (ref 0.44–1.00)
Creatinine, Ser: 1.2 mg/dL — ABNORMAL HIGH (ref 0.44–1.00)
Glucose, Bld: 201 mg/dL — ABNORMAL HIGH (ref 65–99)
Glucose, Bld: 204 mg/dL — ABNORMAL HIGH (ref 65–99)
Glucose, Bld: 244 mg/dL — ABNORMAL HIGH (ref 65–99)
HEMATOCRIT: 28 % — AB (ref 36.0–46.0)
HEMATOCRIT: 30 % — AB (ref 36.0–46.0)
HEMATOCRIT: 35 % — AB (ref 36.0–46.0)
HEMOGLOBIN: 11.9 g/dL — AB (ref 12.0–15.0)
HEMOGLOBIN: 9.5 g/dL — AB (ref 12.0–15.0)
Hemoglobin: 10.2 g/dL — ABNORMAL LOW (ref 12.0–15.0)
Potassium: 2.1 mmol/L — CL (ref 3.5–5.1)
Potassium: 2.9 mmol/L — ABNORMAL LOW (ref 3.5–5.1)
Potassium: 3.1 mmol/L — ABNORMAL LOW (ref 3.5–5.1)
SODIUM: 140 mmol/L (ref 135–145)
SODIUM: 141 mmol/L (ref 135–145)
Sodium: 140 mmol/L (ref 135–145)
TCO2: 29 mmol/L (ref 0–100)
TCO2: 29 mmol/L (ref 0–100)
TCO2: 32 mmol/L (ref 0–100)

## 2017-02-20 LAB — CBC
HEMATOCRIT: 31.3 % — AB (ref 36.0–46.0)
Hemoglobin: 9.9 g/dL — ABNORMAL LOW (ref 12.0–15.0)
MCH: 28.2 pg (ref 26.0–34.0)
MCHC: 31.6 g/dL (ref 30.0–36.0)
MCV: 89.2 fL (ref 78.0–100.0)
Platelets: 237 10*3/uL (ref 150–400)
RBC: 3.51 MIL/uL — ABNORMAL LOW (ref 3.87–5.11)
RDW: 15.2 % (ref 11.5–15.5)
WBC: 17.5 10*3/uL — ABNORMAL HIGH (ref 4.0–10.5)

## 2017-02-20 LAB — RENAL FUNCTION PANEL
ANION GAP: 9 (ref 5–15)
Albumin: 2.7 g/dL — ABNORMAL LOW (ref 3.5–5.0)
Albumin: 2.7 g/dL — ABNORMAL LOW (ref 3.5–5.0)
Anion gap: 7 (ref 5–15)
BUN: 12 mg/dL (ref 6–20)
BUN: 13 mg/dL (ref 6–20)
CALCIUM: 8.2 mg/dL — AB (ref 8.9–10.3)
CHLORIDE: 99 mmol/L — AB (ref 101–111)
CO2: 31 mmol/L (ref 22–32)
CO2: 32 mmol/L (ref 22–32)
CREATININE: 1.22 mg/dL — AB (ref 0.44–1.00)
Calcium: 8.3 mg/dL — ABNORMAL LOW (ref 8.9–10.3)
Chloride: 98 mmol/L — ABNORMAL LOW (ref 101–111)
Creatinine, Ser: 1.13 mg/dL — ABNORMAL HIGH (ref 0.44–1.00)
GFR calc Af Amer: >60 mL/min (ref >60)
GFR calc non Af Amer: 57 mL/min — ABNORMAL LOW (ref >60)
GFR calc non Af Amer: 52 mL/min — ABNORMAL LOW (ref >60)
GLUCOSE: 187 mg/dL — AB (ref 65–99)
Glucose, Bld: 177 mg/dL — ABNORMAL HIGH (ref 65–99)
PHOSPHORUS: 3.3 mg/dL (ref 2.5–4.6)
POTASSIUM: 2.9 mmol/L — AB (ref 3.5–5.1)
Phosphorus: 2.6 mg/dL (ref 2.5–4.6)
Potassium: 3.8 mmol/L (ref 3.5–5.1)
SODIUM: 138 mmol/L (ref 135–145)
Sodium: 138 mmol/L (ref 135–145)

## 2017-02-20 LAB — GLUCOSE, CAPILLARY
GLUCOSE-CAPILLARY: 199 mg/dL — AB (ref 65–99)
GLUCOSE-CAPILLARY: 240 mg/dL — AB (ref 65–99)
GLUCOSE-CAPILLARY: 199 mg/dL — AB (ref 65–99)
GLUCOSE-CAPILLARY: 189 mg/dL — AB (ref 65–99)
GLUCOSE-CAPILLARY: 126 mg/dL — AB (ref 65–99)
GLUCOSE-CAPILLARY: 110 mg/dL — AB (ref 65–99)
GLUCOSE-CAPILLARY: 95 mg/dL (ref 65–99)
GLUCOSE-CAPILLARY: 181 mg/dL — AB (ref 65–99)
Glucose-Capillary: 117 mg/dL — ABNORMAL HIGH (ref 65–99)
Glucose-Capillary: 125 mg/dL — ABNORMAL HIGH (ref 65–99)
Glucose-Capillary: 126 mg/dL — ABNORMAL HIGH (ref 65–99)
Glucose-Capillary: 128 mg/dL — ABNORMAL HIGH (ref 65–99)
Glucose-Capillary: 162 mg/dL — ABNORMAL HIGH (ref 65–99)
Glucose-Capillary: 175 mg/dL — ABNORMAL HIGH (ref 65–99)
Glucose-Capillary: 193 mg/dL — ABNORMAL HIGH (ref 65–99)
Glucose-Capillary: 199 mg/dL — ABNORMAL HIGH (ref 65–99)
Glucose-Capillary: 205 mg/dL — ABNORMAL HIGH (ref 65–99)
Glucose-Capillary: 217 mg/dL — ABNORMAL HIGH (ref 65–99)
Glucose-Capillary: 217 mg/dL — ABNORMAL HIGH (ref 65–99)
Glucose-Capillary: 304 mg/dL — ABNORMAL HIGH (ref 65–99)

## 2017-02-20 LAB — HEMOGLOBIN A1C
Hgb A1c MFr Bld: 7.4 % — ABNORMAL HIGH (ref 4.8–5.6)
Mean Plasma Glucose: 166 mg/dL

## 2017-02-20 LAB — PROCALCITONIN: Procalcitonin: 0.14 ng/mL

## 2017-02-20 LAB — MAGNESIUM: Magnesium: 1.8 mg/dL (ref 1.7–2.4)

## 2017-02-20 MED ORDER — INSULIN ASPART 100 UNIT/ML ~~LOC~~ SOLN
5.0000 [IU] | SUBCUTANEOUS | Status: DC
  Administered 2017-02-20 – 2017-02-25 (×24): 5 [IU] via SUBCUTANEOUS

## 2017-02-20 MED ORDER — PNEUMOCOCCAL VAC POLYVALENT 25 MCG/0.5ML IJ INJ
0.5000 mL | INJECTION | INTRAMUSCULAR | Status: AC
  Administered 2017-02-21: 0.5 mL via INTRAMUSCULAR
  Filled 2017-02-20: qty 0.5

## 2017-02-20 MED ORDER — DEXTROSE 10 % IV SOLN: INTRAVENOUS | Status: DC | PRN

## 2017-02-20 MED ORDER — SODIUM CHLORIDE 0.9 % IV SOLN
INTRAVENOUS | Status: DC
  Administered 2017-02-20 – 2017-02-24 (×2): via INTRAVENOUS

## 2017-02-20 MED ORDER — SODIUM CHLORIDE 0.9 % IV SOLN
3.0000 ug/kg/min | INTRAVENOUS | Status: DC
  Administered 2017-02-20: 3 ug/kg/min via INTRAVENOUS
  Filled 2017-02-20 (×2): qty 20

## 2017-02-20 MED ORDER — SODIUM CHLORIDE 0.9 % IV SOLN: 3.0000 ug/kg/min | INTRAVENOUS | Status: DC

## 2017-02-20 MED ORDER — PRISMASOL BGK 4/2.5 32-4-2.5 MEQ/L IV SOLN
INTRAVENOUS | Status: DC
  Administered 2017-02-20 – 2017-02-22 (×5): via INTRAVENOUS_CENTRAL
  Filled 2017-02-20 (×7): qty 5000

## 2017-02-20 MED ORDER — PRISMASOL BGK 4/2.5 32-4-2.5 MEQ/L IV SOLN
INTRAVENOUS | Status: DC
  Administered 2017-02-20 – 2017-02-22 (×4): via INTRAVENOUS_CENTRAL
  Filled 2017-02-20 (×5): qty 5000

## 2017-02-20 MED ORDER — INSULIN ASPART 100 UNIT/ML ~~LOC~~ SOLN
0.0000 [IU] | SUBCUTANEOUS | Status: DC
  Administered 2017-02-20: 7 [IU] via SUBCUTANEOUS
  Administered 2017-02-20: 15 [IU] via SUBCUTANEOUS
  Administered 2017-02-21: 7 [IU] via SUBCUTANEOUS
  Administered 2017-02-21 (×2): 4 [IU] via SUBCUTANEOUS
  Administered 2017-02-21: 7 [IU] via SUBCUTANEOUS
  Administered 2017-02-21: 4 [IU] via SUBCUTANEOUS
  Administered 2017-02-22: 11 [IU] via SUBCUTANEOUS
  Administered 2017-02-22: 4 [IU] via SUBCUTANEOUS
  Administered 2017-02-22 (×2): 7 [IU] via SUBCUTANEOUS
  Administered 2017-02-22 (×2): 4 [IU] via SUBCUTANEOUS
  Administered 2017-02-23: 3 [IU] via SUBCUTANEOUS
  Administered 2017-02-23 (×3): 4 [IU] via SUBCUTANEOUS
  Administered 2017-02-23: 7 [IU] via SUBCUTANEOUS
  Administered 2017-02-23: 3 [IU] via SUBCUTANEOUS
  Administered 2017-02-23: 5 [IU] via SUBCUTANEOUS
  Administered 2017-02-24: 7 [IU] via SUBCUTANEOUS
  Administered 2017-02-24 (×2): 3 [IU] via SUBCUTANEOUS
  Administered 2017-02-25 (×2): 4 [IU] via SUBCUTANEOUS
  Administered 2017-02-25: 11 [IU] via SUBCUTANEOUS
  Administered 2017-02-25 (×2): 4 [IU] via SUBCUTANEOUS
  Administered 2017-02-26: 3 [IU] via SUBCUTANEOUS
  Administered 2017-02-26: 7 [IU] via SUBCUTANEOUS
  Administered 2017-02-26: 3 [IU] via SUBCUTANEOUS
  Administered 2017-02-26: 4 [IU] via SUBCUTANEOUS
  Administered 2017-02-26: 11 [IU] via SUBCUTANEOUS
  Administered 2017-02-27: 4 [IU] via SUBCUTANEOUS
  Administered 2017-02-27: 3 [IU] via SUBCUTANEOUS
  Administered 2017-02-27: 7 [IU] via SUBCUTANEOUS
  Administered 2017-02-27: 4 [IU] via SUBCUTANEOUS

## 2017-02-20 MED ORDER — SODIUM CHLORIDE 0.9 % IV SOLN
2.0000 mg/h | INTRAVENOUS | Status: DC
  Administered 2017-02-21 (×2): 6 mg/h via INTRAVENOUS
  Administered 2017-02-21 – 2017-02-22 (×2): 5 mg/h via INTRAVENOUS
  Filled 2017-02-20 (×3): qty 20

## 2017-02-20 MED ORDER — INSULIN GLARGINE 100 UNIT/ML ~~LOC~~ SOLN
30.0000 [IU] | SUBCUTANEOUS | Status: DC
  Administered 2017-02-20 – 2017-02-21 (×2): 30 [IU] via SUBCUTANEOUS
  Filled 2017-02-20 (×3): qty 0.3

## 2017-02-20 MED ORDER — PRISMASOL BGK 4/2.5 32-4-2.5 MEQ/L IV SOLN
INTRAVENOUS | Status: DC
  Administered 2017-02-20 – 2017-02-22 (×21): via INTRAVENOUS_CENTRAL
  Filled 2017-02-20 (×32): qty 5000

## 2017-02-20 NOTE — Progress Notes (Signed)
CRITICAL VALUE ALERT  Critical Value:  Trop 0.75  Date & Time Notied: 02/20/17 1200  Provider Notified: Dr Lake Bells  Orders Received/Actions taken: Waiting for Echocardiogram; 12 Lead EKG normal

## 2017-02-20 NOTE — Progress Notes (Signed)
Patient ID: [name], female   DOB: August 31, 1971, 46 y.o.   MRN: [id number] [facility] Progress Note   Assessment/ Plan:   1. AKI: Suspected to be hemodynamically mediated with normal renal function at baseline. Started on CRRT yesterday for refractory hyperkalemia/worsening metabolic acidosis. Overnight, developed hypokalemia that is iatrogenic and will be treated with adjustment of refills or/post filter and dialysate solutions with CRRT. Her urine output appears to be improving and renal recovery appears to be underway. Continue ultrafiltration at a cautious -50 mL per hour. 2. Hypokalemia: Most likely iatrogenic from current CRRT prescription-fluids adjusted in attempt to correct hypokalemia. May need piggyback intravenous/oral potassium. 3. Acute asthma exacerbation/ventilator dependent respiratory failure: Remains intubated and clinically appearing to have volume excess-careful ultrafiltration in order avoid delaying renal recovery. 4. Anion gap metabolic acidosis: Secondary to acute kidney injury-corrected with CRRT.  Subjective:   No acute events overnight-able to tolerate ultrafiltration -50 mL/h. Hypokalemic overnight.    Objective:   BP 122/74 (BP Location: Right Wrist)   Pulse (!) 108   Temp 97.6 F (36.4 C) (Oral)   Resp 18   Ht 5' 3"  (1.6 m)   Wt 113.4 kg (250 lb)   SpO2 98%   BMI 44.29 kg/m   Intake/Output Summary (Last 24 hours) at 02/20/17 0810 Last data filed at 02/20/17 0800  Gross per 24 hour  Intake          5586.13 ml  Output             4783 ml  Net           803.13 ml   Weight change: 4.7 kg (10 lb 5.8 oz)  Physical Exam: [id number]:[id number], sedated, unresponsive CVS: Pulse regular tachycardia, S1 and S2 normal Resp: Clear to auscultation bilaterally-no rales Abd: Soft, obese, nontender Ext: 1+ edema over lower extremity/upper extremity/dependent areas  Imaging: Dg Chest Port 1 View  Result Date: 02/20/2017 CLINICAL DATA:   Respiratory failure. EXAM: PORTABLE CHEST 1 VIEW COMPARISON:  02/19/2017. FINDINGS: Endotracheal tube and NG tube in stable position. Right IJ line in stable position. Cardiomegaly with pulmonary vascular prominence. Low lung volumes. IMPRESSION: 1. Lines and tubes in stable position. 2. Low lung volumes. Cardiomegaly with pulmonary vascular prominence. No overt pulmonary edema. Chest is stable from prior exam. Electronically Signed   By: Marcello Moores  Register   On: 02/20/2017 06:53   Dg Chest Port 1 View  Result Date: 02/19/2017 CLINICAL DATA:  Status post central line placement EXAM: PORTABLE CHEST 1 VIEW COMPARISON:  02/19/2017 FINDINGS: Cardiac shadow remains enlarged. Endotracheal tube and nasogastric catheter are noted in satisfactory position. A right jugular central line is noted in the mid superior vena cava. No pneumothorax is seen. The overall inspiratory effort is poor with crowding vascular markings. No focal infiltrate is seen. IMPRESSION: No pneumothorax following central line placement. Electronically Signed   By: Inez Catalina M.D.   On: 02/19/2017 14:47   Dg Chest Port 1 View  Result Date: 02/19/2017 CLINICAL DATA:  Acute onset of shortness of breath. Endotracheal tube placement. Initial encounter. EXAM: PORTABLE CHEST 1 VIEW COMPARISON:  Chest radiograph performed 02/18/2017 FINDINGS: The patient's endotracheal tube is seen ending 3 cm above the carina. An enteric tube is noted extending below the diaphragm. Vascular congestion is noted. Mild left-sided airspace opacities may reflect pneumonia or mild asymmetric interstitial edema. No pleural effusion or pneumothorax is seen. The cardiomediastinal silhouette is mildly enlarged. No acute osseous abnormalities are identified. IMPRESSION: 1. Endotracheal  tube seen ending 3 cm above the carina. 2. Vascular congestion and mild cardiomegaly. Mild left-sided airspace opacities may reflect pneumonia or mild asymmetric interstitial edema. Electronically  Signed   By: Garald Balding M.D.   On: 02/19/2017 01:33   Dg Chest Port 1 View  Result Date: 02/18/2017 CLINICAL DATA:  Shortness of breath and cough for 2 days EXAM: PORTABLE CHEST 1 VIEW COMPARISON:  08/22/2015 FINDINGS: The heart size and mediastinal contours are within normal limits. Both lungs are clear. The visualized skeletal structures are unremarkable. IMPRESSION: No active disease. Electronically Signed   By: Inez Catalina M.D.   On: 02/18/2017 16:36    Labs: BMET  Recent Labs Lab 02/18/17 1648 02/19/17 0524 02/19/17 1610 02/19/17 1536  02/19/17 2115 02/19/17 2153 02/19/17 2159 02/19/17 2357 02/20/17 0001 02/20/17 0440 02/20/17 0553  NA 138 137 136 140  < > 136 140 141 140 141 138 140  K 3.0* 7.5* 6.8* 4.2  < > 3.3* 3.4* 3.2* 3.1* 2.1* 2.9* 2.9*  CL 108 103 109 109  < > 104 100* 95* 97* 92* 98* 92*  CO2 21* 21* 18* 21*  --  25  --   --   --   --  31  --   GLUCOSE 161* 375* 237* 172*  < > 221* 210* 240* 204* 244* 187* 201*  BUN 15 14 16 17   < > 13 18 17 16 11 12 15   CREATININE 1.00 1.73* 2.16* 2.04*  < > 1.43* 1.30* 1.20* 1.20* 0.70 1.13* 1.10*  CALCIUM 8.6* 7.8* 7.9* 7.9*  --  7.6*  --   --   --   --  8.3*  --   PHOS  --  4.6  --  4.1  --   --   --   --   --   --  2.6  --   < > = values in this interval not displayed. CBC  Recent Labs Lab 02/18/17 1648 02/19/17 0524  02/19/17 2357 02/20/17 0001 02/20/17 0335 02/20/17 0553  WBC 11.1* 13.1*  --   --   --  17.5*  --   HGB 11.5* 10.8*  < > 9.5* 11.9* 9.9* 10.2*  HCT 35.6* 33.9*  < > 28.0* 35.0* 31.3* 30.0*  MCV 87.3 88.7  --   --   --  89.2  --   PLT 259 275  --   --   --  237  --   < > = values in this interval not displayed.  Medications:    . albuterol  2.5 mg Nebulization Q4H  . artificial tears  1 application Both Eyes R6E  . aspirin  325 mg Per Tube Daily  . chlorhexidine gluconate (MEDLINE KIT)  15 mL Mouth Rinse BID  . cisatracurium  2.5 mg Intravenous Once  . enoxaparin (LOVENOX) injection  55 mg  Subcutaneous Q24H  . feeding supplement (VITAL HIGH PROTEIN)  1,000 mL Per Tube Q24H  . fentaNYL (SUBLIMAZE) injection  100 mcg Intravenous Once  . mouth rinse  15 mL Mouth Rinse QID  . methylPREDNISolone (SOLU-MEDROL) injection  60 mg Intravenous Q6H  . midazolam  2 mg Intravenous Once  . olopatadine  1 drop Right Eye BID  . pantoprazole sodium  40 mg Per Tube QHS  . potassium chloride  40 mEq Per Tube Once  . simvastatin  10 mg Per Tube q1800  . topiramate  100 mg Oral BID   Elmarie Shiley, MD 02/20/2017, 8:10 AM

## 2017-02-20 NOTE — Care Management Note (Signed)
Case Management Note  Patient Details  Name: [name] MRN: [id number] Date of Birth: [birth year]  Subjective/Objective:                    Action/Plan:  PTA independent  from home with husband  Currently ventilated with paralytic on CRRT   Expected Discharge Date:                  Expected Discharge Plan:  Home/Self Care  In-House Referral:     Discharge planning Services  CM Consult  Post Acute Care Choice:    Choice offered to:     DME Arranged:    DME Agency:     HH Arranged:    HH Agency:     Status of Service:     If discussed at [employer], dates discussed:    Additional Comments:  Maryclare Labrador, RN 02/20/2017, 2:36 PM

## 2017-02-20 NOTE — Progress Notes (Signed)
Family Medicine Teaching Service appreciate CCM management of [name]. We will assume care once she is transferred out of the ICU.  Marjie Skiff, MD Family Medicine PGY-1

## 2017-02-20 NOTE — Progress Notes (Signed)
[facility] PCCM Attending:  I have seen and examined the patient with nurse practitioner/resident and agree with the note above.  We formulated the plan together and I elicited the following history.    Overnight temp dropped to 94 Started on CVVHD yesterday Making some urine Moving air today, diffuse wheezing noted Remains on paralytics   Exam: Vitals:   02/20/17 0915 02/20/17 0930 02/20/17 0945 02/20/17 1000  BP: 114/72 118/71 113/75 115/74  Pulse: 98 96 96 94  Resp: 18 18 18 18   Temp:      TempSrc:      SpO2: 99% 98% 98% 99%  Weight:      Height:       Vent Mode: PRVC FiO2 (%):  [50 %] 50 % Set Rate:  [18 bmp] 18 bmp Vt Set:  [380 mL] 380 mL PEEP:  [5 cmH20] 5 cmH20 Plateau Pressure:  [19 cmH20-23 cmH20] 23 cmH20  General:  On vent, sedated HENT: NCAT OP clear PULM: Wheezing bilaterally, vent support, normal effort CV: RRR, no mgr GI: BS+, soft, nontender MSK: normal bulk and tone Neuro: sedated on vent, paralyzed chemically   CXR images reviewed> cardiomegaly, tubes, lines in place, no infiltrate on my review  Impression/plan:  Acute respiratory failure with hypoxemia> continue full vent support, stopping nimbex today Asthma exacerbation> continue solumedrol, bronchodilators (albuterol scheduled) AKI> continue CVVHD per renal Demand ischemia> check TTE Nutrition> start trickle feeds Hyperglycemia> continue insulin drip  My cc time 35 minutes  Roselie Awkward, MD [place] [facility]M Pager: [phone] Cell: [phone] After 3pm or if no response, call [phone]

## 2017-02-20 NOTE — Progress Notes (Signed)
PULMONARY / CRITICAL CARE MEDICINE   Name: [name] MRN: [id number] DOB: 12/21/1970    ADMISSION DATE:  02/18/2017 CONSULTATION DATE:  02/18/17  REFERRING MD:  Gwendlyn Deutscher  CHIEF COMPLAINT:  SOB  HISTORY OF PRESENT ILLNESS:  [name] is a 46 y.o. female with PMH as outlined below including but not limited to Asthma.  She presented to [facility] ED 5/30 with SOB that began the afternoon prior. Symptoms have progressively worsened since onset.  She had been in her usual state of health prior and only exposure to sick contacts was her husband who had cough and runny nose.  Smokes intermittently for the last 3 years.  She has been hospitalized for asthma multiple times in the past but not required intubation.  In ED, she received 2 CAT's; however, remained in respiratory distress with increased WOB.  ABG worse; therefore, PCCM asked to see in consultation.  Given work of breathing and worsening ABG, decision made to proceed with intubation.  SUBJECTIVE:   Hypokalemic overnight on CRRT Temp dropped to 94.3 overnight now on bairhugger  VITAL SIGNS: BP 118/71   Pulse 96   Temp 97.6 F (36.4 C) (Oral)   Resp 18   Ht 5\' 3"  (1.6 m)   Wt 250 lb (113.4 kg)   SpO2 98%   BMI 44.29 kg/m   HEMODYNAMICS:   VENTILATOR SETTINGS: Vent Mode: PRVC FiO2 (%):  [50 %] 50 % Set Rate:  [18 bmp] 18 bmp Vt Set:  [380 mL] 380 mL PEEP:  [5 cmH20] 5 cmH20 Plateau Pressure:  [19 cmH20-23 cmH20] 23 cmH20  INTAKE / OUTPUT: I/O last 3 completed shifts: In: 7789 [I.V.:4598; Other:120; NG/GT:911; IV [id number]:7619] Out: 5093 [Urine:759; Other:3820]   PHYSICAL EXAMINATION: General: Critically ill appearing female paralyzed on MV HEENT: MM pink, ETT/ OGT, pupils 3/sluggish Neuro: Heavily sedated and paralyzed  CV: SR 98, s1s2 rrr, no m/r/g PULM: even/ synchronous on MV w/ paralytics, improved air movement from yest, still diminished w/ exp wheeze GI: soft, hypo  BS Extremities: warm/dry, +1 generalized nonpitting edema  Skin: no rashes or lesions  LABS:  BMET  Recent Labs Lab 02/19/17 1536  02/19/17 2115  02/20/17 0001 02/20/17 0440 02/20/17 0553  NA 140  < > 136  < > 141 138 140  K 4.2  < > 3.3*  < > 2.1* 2.9* 2.9*  CL 109  < > 104  < > 92* 98* 92*  CO2 21*  --  25  --   --  31  --   BUN 17  < > 13  < > 11 12 15   CREATININE 2.04*  < > 1.43*  < > 0.70 1.13* 1.10*  GLUCOSE 172*  < > 221*  < > 244* 187* 201*  < > = values in this interval not displayed.  Electrolytes  Recent Labs Lab 02/19/17 0524  02/19/17 1536 02/19/17 2115 02/20/17 0335 02/20/17 0440  CALCIUM 7.8*  < > 7.9* 7.6*  --  8.3*  MG 2.1  --   --   --  1.8  --   PHOS 4.6  --  4.1  --   --  2.6  < > = values in this interval not displayed.  CBC  Recent Labs Lab 02/18/17 1648 02/19/17 0524  02/20/17 0001 02/20/17 0335 02/20/17 0553  WBC 11.1* 13.1*  --   --  17.5*  --   HGB 11.5* 10.8*  < > 11.9* 9.9* 10.2*  HCT 35.6* 33.9*  < >  35.0* 31.3* 30.0*  PLT 259 275  --   --  237  --   < > = values in this interval not displayed.  Coag's No results for input(s): APTT, INR in the last 168 hours.  Sepsis Markers  Recent Labs Lab 02/19/17 1536 02/19/17 1540 02/20/17 0335  LATICACIDVEN 2.8* 2.3*  --   PROCALCITON 0.24  --  0.14    ABG  Recent Labs Lab 02/19/17 0753 02/19/17 1207 02/20/17 0730  PHART 7.169* 7.139* 7.371  PCO2ART 52.9* 60.2* 62.0*  PO2ART 155.0* 116.0* 91.3    Liver Enzymes  Recent Labs Lab 02/19/17 1536 02/20/17 0440  ALBUMIN 3.2* 2.7*    Cardiac Enzymes  Recent Labs Lab 02/19/17 0219 02/19/17 0524 02/19/17 0958  TROPONINI <0.03 <0.03 0.11*    Glucose  Recent Labs Lab 02/20/17 0356 02/20/17 0457 02/20/17 0556 02/20/17 0658 02/20/17 0803 02/20/17 0905  GLUCAP 199* 240* 199* 205* 189* 175*    Imaging Dg Chest Port 1 View  Result Date: 02/20/2017 CLINICAL DATA:  Respiratory failure. EXAM: PORTABLE CHEST  1 VIEW COMPARISON:  02/19/2017. FINDINGS: Endotracheal tube and NG tube in stable position. Right IJ line in stable position. Cardiomegaly with pulmonary vascular prominence. Low lung volumes. IMPRESSION: 1. Lines and tubes in stable position. 2. Low lung volumes. Cardiomegaly with pulmonary vascular prominence. No overt pulmonary edema. Chest is stable from prior exam. Electronically Signed   By: Marcello Moores  Register   On: 02/20/2017 06:53   Dg Chest Port 1 View  Result Date: 02/19/2017 CLINICAL DATA:  Status post central line placement EXAM: PORTABLE CHEST 1 VIEW COMPARISON:  02/19/2017 FINDINGS: Cardiac shadow remains enlarged. Endotracheal tube and nasogastric catheter are noted in satisfactory position. A right jugular central line is noted in the mid superior vena cava. No pneumothorax is seen. The overall inspiratory effort is poor with crowding vascular markings. No focal infiltrate is seen. IMPRESSION: No pneumothorax following central line placement. Electronically Signed   By: Inez Catalina M.D.   On: 02/19/2017 14:47    STUDIES:  TTE 6/1 >  CULTURES: 5/31 Sputum  >  5/31 RVP  > + rhinovirus/enterovirus 5/31 BCx 2 >   ANTIBIOTICS: Rocephin 5/31 >  SIGNIFICANT EVENTS: 5/30 > admit for acute asthma exacerbation, intubated 5/31 > severe vent dyssynchrony, paralyzed, CRRT 6/1 >   LINES/TUBES: ETT 5/30 >  OGT 5/30 > Foley 5/30 > R IJ HD 5/31 > PIV   DISCUSSION: 46 y.o. female admitted 5/30 with asthma exacerbation that failed conservative management and required intubation.  ASSESSMENT / PLAN:  PULMONARY A: Asthma exacerbation -severe  Vent dyssychrony Left upper lobe opacity - infiltrate vs edema Tobacco dependence. P:   Continue full vent support Wean FiO2/ PEEP for sats > 92% Allow for some permissive hypercapnia/ monitor for auto-peeping Stop cisatracurium and assess for vent dyssynchrony Continue albuterol q4 and prn q2 Continue solumedrol 60 mg q6 hr  Trend CXR/  ABG VAP measures Follow cultures  CARDIOVASCULAR A:  Chest tightness - presumed due to asthma exacerbation. Initial EKG negative.  Mildly elevated troponin Hx HTN, HLD P:  ICU monitoring Assess TTE EKG today  Trend Troponins x 3 Goal MAP > 65 Hold preadmission amlodipine, HCTZ, losartan,   RENAL A:   AKI - now on CRRT - with improving UO and renal function  Hypokalemia - ? Iatrogenic  Hyperkalemic - resolved  Mild lactic acidosis- improving  P:   Appreciate Nephrology input  Management per Renal  HCO3 and Calcium gluconate d/c'd  Goal MAP > 65 Trend renal fxn q 12 hr Trend  urinary output  GASTROINTESTINAL A:   GERD. Nutrition. P:   SUP: Pantoprazole. NPO. Continue TF  HEMATOLOGIC A:   Mild anemia- stable VTE Prophylaxis. P:  SCD's / lovenox. Trend CBC   INFECTIOUS A:   Leukocytosis- mild r/t viral , ? Steroid use  + rhinovirus 5/31 P:   Trend cultures Stop rocephin Trend WBC/ fever curve  ENDOCRINE A:  Hyperglycemia   DM. Beta-hydroxybutyric acid 0.27 P:   Continue insulin gtt Hold preadmission farxiga, repaglinide, trulicity.  NEUROLOGIC A:   Acute encephalopathy - due to sedation. Hx depression, chronic back pain, migraines. P:   Sedation:   Fentanyl and Versed gtt RASS goal: -3 Stop nimbex today  TOF /BIS monitoring while on paralytics  Hold preadmission zoloft, percocet, gabapentin, robaxin, topamax, sumatriptan.  Family updated: Husband updated at bedside 6/1 by Dr. Lake Bells   Interdisciplinary Family Meeting v Palliative Care Meeting:  Due by: 02/24/17.  CC time: 45 min.  [name], AGACNP-BC [facility] Pgr: [phone] or if no answer [phone] 02/20/2017, 9:49 AM

## 2017-02-21 ENCOUNTER — Inpatient Hospital Stay (HOSPITAL_COMMUNITY): Payer: Medicare Other

## 2017-02-21 LAB — RENAL FUNCTION PANEL
ALBUMIN: 2.7 g/dL — AB (ref 3.5–5.0)
Albumin: 2.7 g/dL — ABNORMAL LOW (ref 3.5–5.0)
Anion gap: 7 (ref 5–15)
Anion gap: 7 (ref 5–15)
BUN: 21 mg/dL — ABNORMAL HIGH (ref 6–20)
BUN: 26 mg/dL — ABNORMAL HIGH (ref 6–20)
CALCIUM: 8 mg/dL — AB (ref 8.9–10.3)
CHLORIDE: 102 mmol/L (ref 101–111)
CO2: 30 mmol/L (ref 22–32)
CO2: 27 mmol/L (ref 22–32)
CREATININE: 1.29 mg/dL — AB (ref 0.44–1.00)
CREATININE: 1.32 mg/dL — AB (ref 0.44–1.00)
Calcium: 7.9 mg/dL — ABNORMAL LOW (ref 8.9–10.3)
Chloride: 102 mmol/L (ref 101–111)
GFR calc Af Amer: 56 mL/min — ABNORMAL LOW (ref >60)
GFR calc non Af Amer: 49 mL/min — ABNORMAL LOW (ref >60)
GFR, EST AFRICAN AMERICAN: 55 mL/min — AB (ref >60)
GFR, EST NON AFRICAN AMERICAN: 47 mL/min — AB (ref >60)
GLUCOSE: 200 mg/dL — AB (ref 65–99)
Glucose, Bld: 202 mg/dL — ABNORMAL HIGH (ref 65–99)
PHOSPHORUS: 3.1 mg/dL (ref 2.5–4.6)
PHOSPHORUS: 2.1 mg/dL — AB (ref 2.5–4.6)
POTASSIUM: 4.3 mmol/L (ref 3.5–5.1)
Potassium: 4.5 mmol/L (ref 3.5–5.1)
SODIUM: 139 mmol/L (ref 135–145)
Sodium: 136 mmol/L (ref 135–145)

## 2017-02-21 LAB — CBC
HCT: 35 % — ABNORMAL LOW (ref 36.0–46.0)
Hemoglobin: 10.6 g/dL — ABNORMAL LOW (ref 12.0–15.0)
MCH: 28.2 pg (ref 26.0–34.0)
MCHC: 30.3 g/dL (ref 30.0–36.0)
MCV: 93.1 fL (ref 78.0–100.0)
PLATELETS: 260 10*3/uL (ref 150–400)
RBC: 3.76 MIL/uL — ABNORMAL LOW (ref 3.87–5.11)
RDW: 15.7 % — ABNORMAL HIGH (ref 11.5–15.5)
WBC: 19.9 10*3/uL — ABNORMAL HIGH (ref 4.0–10.5)

## 2017-02-21 LAB — CALCIUM, IONIZED: CALCIUM, IONIZED, SERUM: 4.3 mg/dL — AB (ref 4.5–5.6)

## 2017-02-21 LAB — GLUCOSE, CAPILLARY
GLUCOSE-CAPILLARY: 217 mg/dL — AB (ref 65–99)
Glucose-Capillary: 181 mg/dL — ABNORMAL HIGH (ref 65–99)
Glucose-Capillary: 216 mg/dL — ABNORMAL HIGH (ref 65–99)
Glucose-Capillary: 180 mg/dL — ABNORMAL HIGH (ref 65–99)
Glucose-Capillary: 171 mg/dL — ABNORMAL HIGH (ref 65–99)

## 2017-02-21 LAB — TROPONIN I: Troponin I: 0.44 ng/mL (ref <0.03)

## 2017-02-21 LAB — PROCALCITONIN: PROCALCITONIN: 0.13 ng/mL

## 2017-02-21 LAB — MAGNESIUM: Magnesium: 2.2 mg/dL (ref 1.7–2.4)

## 2017-02-21 MED ORDER — CHLORHEXIDINE GLUCONATE CLOTH 2 % EX PADS
6.0000 | MEDICATED_PAD | Freq: Every day | CUTANEOUS | Status: DC
  Administered 2017-02-21 – 2017-02-26 (×4): 6 via TOPICAL

## 2017-02-21 MED ORDER — SODIUM CHLORIDE 0.9% FLUSH: 10.0000 mL | INTRAVENOUS | Status: DC | PRN

## 2017-02-21 MED ORDER — SODIUM CHLORIDE 0.9% FLUSH
10.0000 mL | Freq: Two times a day (BID) | INTRAVENOUS | Status: DC
  Administered 2017-02-21 – 2017-02-25 (×6): 10 mL

## 2017-02-21 MED ORDER — METHYLPREDNISOLONE SODIUM SUCC 40 MG IJ SOLR
40.0000 mg | Freq: Four times a day (QID) | INTRAMUSCULAR | Status: DC
  Administered 2017-02-21 – 2017-02-22 (×4): 40 mg via INTRAVENOUS
  Filled 2017-02-21 (×5): qty 1

## 2017-02-21 NOTE — Progress Notes (Signed)
**  Social note**  [name] is a 46 y.o. female that is being cared for in the ICU for severe asthma exacerbation requiring intubation.  She is currently remains intubated.  Discussed care with CCM, PA-C who suspects patient will be extubated in next couple of days.  Today, she will be taken off of neuromuscular blockade today, with plan to wean tomorrow if able.  Appreciated excellent care being provided by the CCM team.  Family medicine teaching service to assume care once patient appropriate for transfer to our service.  Dawit Tankard M. Lajuana Ripple, DO PGY-3, [facility]

## 2017-02-21 NOTE — Progress Notes (Signed)
Patient ID: [name], female   DOB: 12-Sep-1971, 46 y.o.   MRN: [id number] [facility] Progress Note   Assessment/ Plan:   1. AKI: Suspected to be hemodynamically mediated with normal renal function at baseline. Urine output decreased overnight with minimal net -1.1 ultrafiltration with CRRT. At this point, given no clear evidence of renal recovery, will continue CVVHD for clearance/volume management with plan to transition over to IHD if needed in the next 24-48 hours. 2. Hypokalemia: Iatrogenic from CRRT-corrected overnight 3. Acute asthma exacerbation/ventilator dependent respiratory failure: Remains ventilator dependent-weaning and management per CCM. Assisting with ultrafiltration/volume unloading with caution to limit intradialytic hypotension. 4. Anion gap metabolic acidosis: Secondary to acute kidney injury-corrected with CRRT.  Subjective:   No acute events overnight-no problems noted with CRRT    Objective:   BP 127/83   Pulse 81   Temp 99.3 F (37.4 C) (Oral)   Resp 18   Ht _0  (1.6 m)   Wt 110.4 kg (243 lb 6.2 oz)   SpO2 100%   BMI 43.11 kg/m   Intake/Output Summary (Last 24 hours) at 02/21/17 0745 Last data filed at 02/21/17 0700  Gross per 24 hour  Intake          3476.64 ml  Output             4609 ml  Net         -1132.36 ml   Weight change: -3 kg (-6 lb 9.8 oz)  Physical Exam: XBL:[id number], sedated, comfortable CVS: Pulse regular rhythm, normal rate, normal S1 and S2 Resp: Anteriorly clear to auscultation, no rales Abd: Soft, obese, nontender, Normal bowel sounds Ext: 1+ dependent edema  Imaging: Dg Chest Port 1 View  Result Date: 02/21/2017 CLINICAL DATA:  Pneumonia, diabetes, HTN, on vent EXAM: PORTABLE CHEST 1 VIEW COMPARISON:  Chest x-rays dated 02/20/2017 and 02/18/2017. FINDINGS: Right IJ line appears stable in position with tip at the level of the upper SVC. Endotracheal tube remains well positioned with tip above  the level of the carina. Enteric tube passes below the diaphragm. Mild cardiomegaly is stable. Mild central pulmonary vascular congestion is stable, without overt alveolar pulmonary edema. Suspect small left pleural effusion. No new lung findings seen. IMPRESSION: Stable exam. Persistent mild central pulmonary vascular congestion without overt alveolar pulmonary edema. Suspect small left pleural effusion. Support apparatus is stable in position. Electronically Signed   By: Franki Cabot M.D.   On: 02/21/2017 07:29   Dg Chest Port 1 View  Result Date: 02/20/2017 CLINICAL DATA:  Respiratory failure. EXAM: PORTABLE CHEST 1 VIEW COMPARISON:  02/19/2017. FINDINGS: Endotracheal tube and NG tube in stable position. Right IJ line in stable position. Cardiomegaly with pulmonary vascular prominence. Low lung volumes. IMPRESSION: 1. Lines and tubes in stable position. 2. Low lung volumes. Cardiomegaly with pulmonary vascular prominence. No overt pulmonary edema. Chest is stable from prior exam. Electronically Signed   By: Marcello Moores  Register   On: 02/20/2017 06:53   Dg Chest Port 1 View  Result Date: 02/19/2017 CLINICAL DATA:  Status post central line placement EXAM: PORTABLE CHEST 1 VIEW COMPARISON:  02/19/2017 FINDINGS: Cardiac shadow remains enlarged. Endotracheal tube and nasogastric catheter are noted in satisfactory position. A right jugular central line is noted in the mid superior vena cava. No pneumothorax is seen. The overall inspiratory effort is poor with crowding vascular markings. No focal infiltrate is seen. IMPRESSION: No pneumothorax following central line placement. Electronically Signed   By: Inez Catalina  M.D.   On: 02/19/2017 14:47    Labs: BMET  Recent Labs Lab 02/19/17 1165 02/19/17 7903 02/19/17 1536  02/19/17 2115  02/19/17 2159 02/19/17 2357 02/20/17 0001 02/20/17 0440 02/20/17 0553 02/20/17 1600 02/21/17 0407  NA 137 136 140  < > 136  < > 141 140 141 138 140 138 139  K 7.5*  6.8* 4.2  < > 3.3*  < > 3.2* 3.1* 2.1* 2.9* 2.9* 3.8 4.5  CL 103 109 109  < > 104  < > 95* 97* 92* 98* 92* 99* 102  CO2 21* 18* 21*  --  25  --   --   --   --  31  --  32 30  GLUCOSE 375* 237* 172*  < > 221*  < > 240* 204* 244* 187* 201* 177* 200*  BUN _0 < > 13  < > _1 21*  CREATININE 1.73* 2.16* 2.04*  < > 1.43*  < > 1.20* 1.20* 0.70 1.13* 1.10* 1.22* 1.29*  CALCIUM 7.8* 7.9* 7.9*  --  7.6*  --   --   --   --  8.3*  --  8.2* 8.0*  PHOS 4.6  --  4.1  --   --   --   --   --   --  2.6  --  3.3 3.1  < > = values in this interval not displayed. CBC  Recent Labs Lab 02/18/17 1648 02/19/17 0524  02/20/17 0001 02/20/17 0335 02/20/17 0553 02/21/17 0408  WBC 11.1* 13.1*  --   --  17.5*  --  19.9*  HGB 11.5* 10.8*  < > 11.9* 9.9* 10.2* 10.6*  HCT 35.6* 33.9*  < > 35.0* 31.3* 30.0* 35.0*  MCV 87.3 88.7  --   --  89.2  --  93.1  PLT 259 275  --   --  237  --  260  < > = values in this interval not displayed.  Medications:    . albuterol  2.5 mg Nebulization Q4H  . artificial tears  1 application Both Eyes Y3F  . aspirin  325 mg Per Tube Daily  . chlorhexidine gluconate (MEDLINE KIT)  15 mL Mouth Rinse BID  . enoxaparin (LOVENOX) injection  55 mg Subcutaneous Q24H  . feeding supplement (VITAL HIGH PROTEIN)  1,000 mL Per Tube Q24H  . fentaNYL (SUBLIMAZE) injection  100 mcg Intravenous Once  . insulin aspart  0-20 Units Subcutaneous Q4H  . insulin aspart  5 Units Subcutaneous Q4H  . insulin glargine  30 Units Subcutaneous Q24H  . mouth rinse  15 mL Mouth Rinse QID  . methylPREDNISolone (SOLU-MEDROL) injection  60 mg Intravenous Q6H  . midazolam  2 mg Intravenous Once  . olopatadine  1 drop Right Eye BID  . pantoprazole sodium  40 mg Per Tube QHS  . pneumococcal 23 valent vaccine  0.5 mL Intramuscular Tomorrow-1000  . potassium chloride  40 mEq Per Tube Once  . simvastatin  10 mg Per Tube q1800  . topiramate  100 mg Oral BID   Elmarie Shiley, MD 02/21/2017, 7:45 AM

## 2017-02-21 NOTE — Progress Notes (Signed)
PULMONARY / CRITICAL CARE MEDICINE   Name: [name] MRN: [id number] DOB: Oct 21, 1970    ADMISSION DATE:  02/18/2017 CONSULTATION DATE:  02/18/17  REFERRING MD:  Gwendlyn Deutscher  CHIEF COMPLAINT:  SOB  HISTORY OF PRESENT ILLNESS:  [name] is a 46 y.o. female with PMH as outlined below including but not limited to Asthma.  She presented to [facility] ED 5/30 with SOB that began the afternoon prior. Symptoms have progressively worsened since onset.  She had been in her usual state of health prior and only exposure to sick contacts was her husband who had cough and runny nose.  Smokes intermittently for the last 3 years.  She has been hospitalized for asthma multiple times in the past but not required intubation. In ED, she received 2 CAT's; however, remained in respiratory distress with increased WOB.  ABG worse; therefore, PCCM asked to see in consultation. Given work of breathing and worsening ABG, decision made to proceed with intubation.  SUBJECTIVE:   Sedated and paralyzed.  VITAL SIGNS: BP 127/83   Pulse 81   Temp 99.3 F (37.4 C) (Oral)   Resp 18   Ht 5\' 3"  (1.6 m)   Wt 243 lb 6.2 oz (110.4 kg)   SpO2 99%   BMI 43.11 kg/m   HEMODYNAMICS:   VENTILATOR SETTINGS: Vent Mode: PRVC FiO2 (%):  [50 %] 50 % Set Rate:  [18 bmp] 18 bmp Vt Set:  [350 mL-380 mL] 350 mL PEEP:  [5 cmH20-10 cmH20] 8 cmH20 Plateau Pressure:  [20 cmH20-23 cmH20] 20 cmH20  INTAKE / OUTPUT:  Intake/Output Summary (Last 24 hours) at 02/21/17 0951 Last data filed at 02/21/17 0700  Gross per 24 hour  Intake          2892.34 ml  Output             4037 ml  Net         -1144.66 ml    General appearance:  46 Year old  Female, obese and sedated on vent  Eyes: anicteric sclerae, moist conjunctivae; PERRL, EOMI bilaterally. Mouth:  membranes and no mucosal ulcerations; normal hard and soft palate, orally intubated Neck: Trachea midline; neck supple, no JVD Lungs/chest: CTA, no  wheeze today. with normal respiratory effort and no intercostal retractions CV: RRR, no MRGs  Abdomen: Soft, non-tender; no masses or HSM Extremities: No peripheral edema or extremity lymphadenopathy Skin: Normal temperature, turgor and texture; no rash, ulcers or subcutaneous nodules Psych: sedated and on NMB  LABS:  BMET  Recent Labs Lab 02/20/17 0440 02/20/17 0553 02/20/17 1600 02/21/17 0407  NA 138 140 138 139  K 2.9* 2.9* 3.8 4.5  CL 98* 92* 99* 102  CO2 31  --  32 30  BUN 12 15 13  21*  CREATININE 1.13* 1.10* 1.22* 1.29*  GLUCOSE 187* 201* 177* 200*    Electrolytes  Recent Labs Lab 02/19/17 0524  02/20/17 0335 02/20/17 0440 02/20/17 1600 02/21/17 0407 02/21/17 0408  CALCIUM 7.8*  < >  --  8.3* 8.2* 8.0*  --   MG 2.1  --  1.8  --   --   --  2.2  PHOS 4.6  < >  --  2.6 3.3 3.1  --   < > = values in this interval not displayed.  CBC  Recent Labs Lab 02/19/17 0524  02/20/17 0335 02/20/17 0553 02/21/17 0408  WBC 13.1*  --  17.5*  --  19.9*  HGB 10.8*  < > 9.9*  10.2* 10.6*  HCT 33.9*  < > 31.3* 30.0* 35.0*  PLT 275  --  237  --  260  < > = values in this interval not displayed.  Coag's No results for input(s): APTT, INR in the last 168 hours.  Sepsis Markers  Recent Labs Lab 02/19/17 1536 02/19/17 1540 02/20/17 0335 02/21/17 0408  LATICACIDVEN 2.8* 2.3*  --   --   PROCALCITON 0.24  --  0.14 0.13    ABG  Recent Labs Lab 02/19/17 0753 02/19/17 1207 02/20/17 0730  PHART 7.169* 7.139* 7.371  PCO2ART 52.9* 60.2* 62.0*  PO2ART 155.0* 116.0* 91.3    Liver Enzymes  Recent Labs Lab 02/20/17 0440 02/20/17 1600 02/21/17 0407  ALBUMIN 2.7* 2.7* 2.7*    Cardiac Enzymes  Recent Labs Lab 02/20/17 1047 02/20/17 1600 02/20/17 2247  TROPONINI 0.75* 0.61* 0.44*    Glucose  Recent Labs Lab 02/20/17 1704 02/20/17 1801 02/20/17 1938 02/20/17 2308 02/21/17 0321 02/21/17 0724  GLUCAP 181* 117* 217* 304* 217* 181*    Imaging Dg  Chest Port 1 View  Result Date: 02/21/2017 CLINICAL DATA:  Pneumonia, diabetes, HTN, on vent EXAM: PORTABLE CHEST 1 VIEW COMPARISON:  Chest x-rays dated 02/20/2017 and 02/18/2017. FINDINGS: Right IJ line appears stable in position with tip at the level of the upper SVC. Endotracheal tube remains well positioned with tip above the level of the carina. Enteric tube passes below the diaphragm. Mild cardiomegaly is stable. Mild central pulmonary vascular congestion is stable, without overt alveolar pulmonary edema. Suspect small left pleural effusion. No new lung findings seen. IMPRESSION: Stable exam. Persistent mild central pulmonary vascular congestion without overt alveolar pulmonary edema. Suspect small left pleural effusion. Support apparatus is stable in position. Electronically Signed   By: Franki Cabot M.D.   On: 02/21/2017 07:29    STUDIES:  TTE 6/1 >  CULTURES: 5/31 Sputum  >  5/31 RVP  > + rhinovirus/enterovirus 5/31 BCx 2 >   ANTIBIOTICS: Rocephin 5/31 >  SIGNIFICANT EVENTS: 5/30 > admit for acute asthma exacerbation, intubated 5/31 > severe vent dyssynchrony, paralyzed, CRRT 6/1 > remained on CRRT and NMB  LINES/TUBES: ETT 5/30 >  OGT 5/30 > Foley 5/30 > R IJ HD 5/31 > PIV   DISCUSSION: 46 y.o. female admitted 5/30 with asthma exacerbation that failed conservative management and required intubation.  ASSESSMENT / PLAN:  PULMONARY A: Asthma exacerbation -severe  Vent dyssychrony Left upper lobe opacity - infiltrate vs edema Tobacco dependence. -pcxr personally reviewed. LLL vol loss ? Small effusion. vasc congestion but no sig effusion.  Still hypercarbic  P:   Cont full vent support Will stop NMB today  Cont PAD protocol Cont scheduled BDs Cont steroids (decreased to 40mg /d) See ID section  CARDIOVASCULAR A:  Chest tightness - presumed due to asthma exacerbation. Initial EKG negative.  Mildly elevated troponin Hx HTN, HLD P:  Cont tele Cont KVO  IVFs F/u echo  RENAL A:   AKI - now on CRRT - with improving UO and renal function  -no evidence of renal recovery thus far.  At risk for fluid and electrolyte imbalance  P:   CRRT per renal  Cont I&O Replace lytes as needed  GASTROINTESTINAL A:   GERD. Nutrition. P:   tubefeeds Cont ppi   HEMATOLOGIC A:   Mild anemia- stable VTE Prophylaxis. P:  Trend cbc Cont Cook heparin  Transfuse per ICU protocol    INFECTIOUS A:   Leukocytosis- mild r/t viral , ? Steroid  use  + rhinovirus 5/31 P:   Cont rocephin  Trend cbc  ENDOCRINE A:  Hyperglycemia   DM. Beta-hydroxybutyric acid 0.27 P:   SSI   NEUROLOGIC A:   Acute encephalopathy - due to sedation. Hx depression, chronic back pain, migraines. P:   PAD protocol    Family updated: Husband updated at bedside   Discussion Looking a little better. Excellent vent mechanics w/out air trapping. Will stop NMB today. Let her set own Ve and see what her vent compliance looks like. Decreased her steroids to 40 q 6. Would like to see her make urine BUT she is making some improvement.   My ccm time 32 minutes.   Erick Colace ACNP-BC [place] Pager # [phone] OR # [phone] if no answer

## 2017-02-22 ENCOUNTER — Inpatient Hospital Stay (HOSPITAL_COMMUNITY): Payer: Medicare Other

## 2017-02-22 DIAGNOSIS — I517 Cardiomegaly: Secondary | ICD-10-CM

## 2017-02-22 LAB — BLOOD GAS, ARTERIAL
ACID-BASE EXCESS: 2.1 mmol/L — AB (ref 0.0–2.0)
BICARBONATE: 27 mmol/L (ref 20.0–28.0)
Drawn by: 249101
FIO2: 40
O2 Saturation: 96.5 %
PEEP: 10 cmH2O
PRESSURE CONTROL: 23 cmH2O
Patient temperature: 98.6
RATE: 18 resp/min
pCO2 arterial: 49.1 mmHg — ABNORMAL HIGH (ref 32.0–48.0)
pH, Arterial: 7.36 (ref 7.350–7.450)
pO2, Arterial: 88.4 mmHg (ref 83.0–108.0)

## 2017-02-22 LAB — URINALYSIS, ROUTINE W REFLEX MICROSCOPIC
BILIRUBIN URINE: NEGATIVE
Bacteria, UA: NONE SEEN
KETONES UR: NEGATIVE mg/dL
Leukocytes, UA: NEGATIVE
Nitrite: NEGATIVE
PH: 5 (ref 5.0–8.0)
PROTEIN: NEGATIVE mg/dL
Specific Gravity, Urine: 1.021 (ref 1.005–1.030)

## 2017-02-22 LAB — RENAL FUNCTION PANEL
ANION GAP: 8 (ref 5–15)
Albumin: 2.5 g/dL — ABNORMAL LOW (ref 3.5–5.0)
Albumin: 2.6 g/dL — ABNORMAL LOW (ref 3.5–5.0)
Anion gap: 9 (ref 5–15)
BUN: 30 mg/dL — ABNORMAL HIGH (ref 6–20)
BUN: 33 mg/dL — ABNORMAL HIGH (ref 6–20)
CALCIUM: 8.1 mg/dL — AB (ref 8.9–10.3)
CALCIUM: 8.1 mg/dL — AB (ref 8.9–10.3)
CO2: 27 mmol/L (ref 22–32)
CO2: 26 mmol/L (ref 22–32)
CREATININE: 1.22 mg/dL — AB (ref 0.44–1.00)
Chloride: 101 mmol/L (ref 101–111)
Chloride: 101 mmol/L (ref 101–111)
Creatinine, Ser: 1.08 mg/dL — ABNORMAL HIGH (ref 0.44–1.00)
GFR, EST NON AFRICAN AMERICAN: 52 mL/min — AB (ref >60)
Glucose, Bld: 203 mg/dL — ABNORMAL HIGH (ref 65–99)
Glucose, Bld: 230 mg/dL — ABNORMAL HIGH (ref 65–99)
PHOSPHORUS: 3.4 mg/dL (ref 2.5–4.6)
Phosphorus: 1.9 mg/dL — ABNORMAL LOW (ref 2.5–4.6)
Potassium: 4 mmol/L (ref 3.5–5.1)
Potassium: 4.1 mmol/L (ref 3.5–5.1)
SODIUM: 137 mmol/L (ref 135–145)
SODIUM: 135 mmol/L (ref 135–145)

## 2017-02-22 LAB — CBC
HCT: 33.4 % — ABNORMAL LOW (ref 36.0–46.0)
Hemoglobin: 10.5 g/dL — ABNORMAL LOW (ref 12.0–15.0)
MCH: 28.5 pg (ref 26.0–34.0)
MCHC: 31.4 g/dL (ref 30.0–36.0)
MCV: 90.8 fL (ref 78.0–100.0)
PLATELETS: 235 10*3/uL (ref 150–400)
RBC: 3.68 MIL/uL — ABNORMAL LOW (ref 3.87–5.11)
RDW: 15.3 % (ref 11.5–15.5)
WBC: 18.5 10*3/uL — AB (ref 4.0–10.5)

## 2017-02-22 LAB — GLUCOSE, CAPILLARY
Glucose-Capillary: 189 mg/dL — ABNORMAL HIGH (ref 65–99)
Glucose-Capillary: 189 mg/dL — ABNORMAL HIGH (ref 65–99)
Glucose-Capillary: 198 mg/dL — ABNORMAL HIGH (ref 65–99)
Glucose-Capillary: 211 mg/dL — ABNORMAL HIGH (ref 65–99)
Glucose-Capillary: 224 mg/dL — ABNORMAL HIGH (ref 65–99)
Glucose-Capillary: 265 mg/dL — ABNORMAL HIGH (ref 65–99)

## 2017-02-22 LAB — MAGNESIUM: MAGNESIUM: 2.4 mg/dL (ref 1.7–2.4)

## 2017-02-22 LAB — ECHOCARDIOGRAM COMPLETE
HEIGHTINCHES: 63 in
Weight: 3862.46 oz

## 2017-02-22 MED ORDER — INSULIN GLARGINE 100 UNIT/ML ~~LOC~~ SOLN
45.0000 [IU] | SUBCUTANEOUS | Status: DC
  Administered 2017-02-22: 45 [IU] via SUBCUTANEOUS
  Filled 2017-02-22 (×2): qty 0.45

## 2017-02-22 MED ORDER — POLYETHYLENE GLYCOL 3350 17 G PO PACK
17.0000 g | PACK | Freq: Two times a day (BID) | ORAL | Status: DC
  Administered 2017-02-22 – 2017-02-27 (×6): 17 g via ORAL
  Filled 2017-02-22 (×9): qty 1

## 2017-02-22 MED ORDER — ALBUTEROL SULFATE (2.5 MG/3ML) 0.083% IN NEBU
2.5000 mg | INHALATION_SOLUTION | Freq: Four times a day (QID) | RESPIRATORY_TRACT | Status: DC
  Administered 2017-02-22 – 2017-02-27 (×19): 2.5 mg via RESPIRATORY_TRACT
  Filled 2017-02-22 (×23): qty 3

## 2017-02-22 MED ORDER — ARFORMOTEROL TARTRATE 15 MCG/2ML IN NEBU
15.0000 ug | INHALATION_SOLUTION | Freq: Two times a day (BID) | RESPIRATORY_TRACT | Status: DC
  Administered 2017-02-22 – 2017-02-27 (×11): 15 ug via RESPIRATORY_TRACT
  Filled 2017-02-22 (×13): qty 2

## 2017-02-22 MED ORDER — SENNOSIDES-DOCUSATE SODIUM 8.6-50 MG PO TABS
1.0000 | ORAL_TABLET | Freq: Two times a day (BID) | ORAL | Status: DC
  Administered 2017-02-22 – 2017-02-27 (×7): 1 via ORAL
  Filled 2017-02-22 (×10): qty 1

## 2017-02-22 MED ORDER — BUDESONIDE 0.5 MG/2ML IN SUSP
0.5000 mg | Freq: Two times a day (BID) | RESPIRATORY_TRACT | Status: DC
  Administered 2017-02-22 – 2017-02-27 (×11): 0.5 mg via RESPIRATORY_TRACT
  Filled 2017-02-22 (×13): qty 2

## 2017-02-22 MED ORDER — METHYLPREDNISOLONE SODIUM SUCC 40 MG IJ SOLR
40.0000 mg | Freq: Three times a day (TID) | INTRAMUSCULAR | Status: DC
  Administered 2017-02-22 – 2017-02-23 (×3): 40 mg via INTRAVENOUS
  Filled 2017-02-22 (×4): qty 1

## 2017-02-22 MED ORDER — ACETAMINOPHEN 325 MG PO TABS
650.0000 mg | ORAL_TABLET | ORAL | Status: DC | PRN
  Administered 2017-02-23 – 2017-02-26 (×6): 650 mg via ORAL
  Filled 2017-02-22 (×6): qty 2

## 2017-02-22 MED ORDER — SODIUM PHOSPHATES 45 MMOLE/15ML IV SOLN
30.0000 mmol | Freq: Once | INTRAVENOUS | Status: AC
  Administered 2017-02-22: 30 mmol via INTRAVENOUS
  Filled 2017-02-22: qty 10

## 2017-02-22 NOTE — Progress Notes (Signed)
**  Social Note**  [name] is a 46 yo F that is being cared for in the ICU after severe asthma exacerbation requiring intubation. Patient remains intubated and sedated, however patient is likely to be extubated either today or tomorrow per ICU team. She was taken off of neuromuscular blockade yesterday, and plan is to wean today. No acute events overnight. Appreciate excellent care being provided by the CCM team. Family Medicine Teaching Service to assume care once patient appropriate for transfer to our service.   Adin Hector, MD, MPH PGY-2 [place] [facility] Pager [phone]

## 2017-02-22 NOTE — Progress Notes (Signed)
Patient ID: [name], female   DOB: 04-Jul-1971, 46 y.o.   MRN: [id number] [facility] Progress Note   Assessment/ Plan:   1. AKI: Suspected to be hemodynamically mediated with normal renal function at baseline. Oliguric with urine output <400 mL overnight and no evidence of renal recovery. Given her current blood pressures, plan to discontinue CRRT later today and monitor with labs/physical exam thereafter for any acute indications for dialysis (she may be able to tolerate intermittent hemodialysis). 2. Acute asthma exacerbation/ventilator dependent respiratory failure: Remains ventilator dependent-weaning and management per CCM. Assisting with ultrafiltration/volume unloading with caution to limit intradialytic hypotension. 3. Anion gap metabolic acidosis: Secondary to acute kidney injury-corrected with CRRT. 4. Hypophosphatemia: Secondary to CRRT losses-replaced overnight 5. Anemia: Mild/stable-likely anemia of critical illness. Continue to monitor for overt losses  Subjective:   No acute events overnight-continues to have marginal urine output with successful ultrafiltration.    Objective:   BP 135/88   Pulse 79   Temp 97.7 F (36.5 C) (Oral)   Resp 18   Ht 5' 3"  (1.6 m)   Wt 109.5 kg (241 lb 6.5 oz)   SpO2 100%   BMI 42.76 kg/m   Intake/Output Summary (Last 24 hours) at 02/22/17 8182 Last data filed at 02/22/17 0700  Gross per 24 hour  Intake          2607.43 ml  Output             4071 ml  Net         -1463.57 ml   Weight change: -0.9 kg (-1 lb 15.8 oz)  Physical Exam: XHB:[id number], sedated, comfortable CVS: Pulse regular rhythm, normal rate, normal S1 and S2 Resp: Anteriorly clear to auscultation, no rales Abd: Soft, obese, nontender, Normal bowel sounds Ext: Trace dependent edema  Imaging: Dg Chest Port 1 View  Result Date: 02/22/2017 CLINICAL DATA:  Respiratory failure. EXAM: PORTABLE CHEST 1 VIEW COMPARISON:  02/21/2017 and prior  exams FINDINGS: An endotracheal tube with tip 2 cm above carina, right IJ central venous catheter with tip overlying the upper SVC and NG tube entering the stomach again noted. Upper limits normal heart size noted. Improved left basilar aeration and decreased vascular congestion noted. There is no evidence of pneumothorax. IMPRESSION: Improved left basilar aeration with decreased pulmonary vascular congestion. Electronically Signed   By: Margarette Canada M.D.   On: 02/22/2017 07:25   Dg Chest Port 1 View  Result Date: 02/21/2017 CLINICAL DATA:  Pneumonia, diabetes, HTN, on vent EXAM: PORTABLE CHEST 1 VIEW COMPARISON:  Chest x-rays dated 02/20/2017 and 02/18/2017. FINDINGS: Right IJ line appears stable in position with tip at the level of the upper SVC. Endotracheal tube remains well positioned with tip above the level of the carina. Enteric tube passes below the diaphragm. Mild cardiomegaly is stable. Mild central pulmonary vascular congestion is stable, without overt alveolar pulmonary edema. Suspect small left pleural effusion. No new lung findings seen. IMPRESSION: Stable exam. Persistent mild central pulmonary vascular congestion without overt alveolar pulmonary edema. Suspect small left pleural effusion. Support apparatus is stable in position. Electronically Signed   By: Franki Cabot M.D.   On: 02/21/2017 07:29    Labs: BMET  Recent Labs Lab 02/19/17 0524  02/19/17 1536  02/19/17 2115  02/20/17 0001 02/20/17 0440 02/20/17 0553 02/20/17 1600 02/21/17 0407 02/21/17 1803 02/22/17 0356  NA 137  < > 140  < > 136  < > 141 138 140 138 139 136 137  K 7.5*  < > 4.2  < > 3.3*  < > 2.1* 2.9* 2.9* 3.8 4.5 4.3 4.0  CL 103  < > 109  < > 104  < > 92* 98* 92* 99* 102 102 101  CO2 21*  < > 21*  --  25  --   --  31  --  32 30 27 27   GLUCOSE 375*  < > 172*  < > 221*  < > 244* 187* 201* 177* 200* 202* 203*  BUN 14  < > 17  < > 13  < > 11 12 15 13  21* 26* 30*  CREATININE 1.73*  < > 2.04*  < > 1.43*  < > 0.70  1.13* 1.10* 1.22* 1.29* 1.32* 1.22*  CALCIUM 7.8*  < > 7.9*  --  7.6*  --   --  8.3*  --  8.2* 8.0* 7.9* 8.1*  PHOS 4.6  --  4.1  --   --   --   --  2.6  --  3.3 3.1 2.1* 1.9*  < > = values in this interval not displayed. CBC  Recent Labs Lab 02/19/17 0524  02/20/17 0335 02/20/17 0553 02/21/17 0408 02/22/17 0356  WBC 13.1*  --  17.5*  --  19.9* 18.5*  HGB 10.8*  < > 9.9* 10.2* 10.6* 10.5*  HCT 33.9*  < > 31.3* 30.0* 35.0* 33.4*  MCV 88.7  --  89.2  --  93.1 90.8  PLT 275  --  237  --  260 235  < > = values in this interval not displayed.  Medications:    . albuterol  2.5 mg Nebulization Q4H  . artificial tears  1 application Both Eyes X4I  . aspirin  325 mg Per Tube Daily  . chlorhexidine gluconate (MEDLINE KIT)  15 mL Mouth Rinse BID  . Chlorhexidine Gluconate Cloth  6 each Topical Daily  . enoxaparin (LOVENOX) injection  55 mg Subcutaneous Q24H  . feeding supplement (VITAL HIGH PROTEIN)  1,000 mL Per Tube Q24H  . fentaNYL (SUBLIMAZE) injection  100 mcg Intravenous Once  . insulin aspart  0-20 Units Subcutaneous Q4H  . insulin aspart  5 Units Subcutaneous Q4H  . insulin glargine  30 Units Subcutaneous Q24H  . mouth rinse  15 mL Mouth Rinse QID  . methylPREDNISolone (SOLU-MEDROL) injection  40 mg Intravenous Q6H  . midazolam  2 mg Intravenous Once  . olopatadine  1 drop Right Eye BID  . pantoprazole sodium  40 mg Per Tube QHS  . potassium chloride  40 mEq Per Tube Once  . simvastatin  10 mg Per Tube q1800  . sodium chloride flush  10-40 mL Intracatheter Q12H  . topiramate  100 mg Oral BID   Elmarie Shiley, MD 02/22/2017, 8:21 AM

## 2017-02-22 NOTE — Progress Notes (Signed)
[facility] Progress Note Patient Name: [name] DOB: [birth year] MRN: [id number]   Date of Service  02/22/2017  HPI/Events of Note  hypophosphatemia  eICU Interventions  Phos replaced     Intervention Category Intermediate Interventions: Electrolyte abnormality - evaluation and management  DETERDING,ELIZABETH 02/22/2017, 4:58 AM

## 2017-02-22 NOTE — Progress Notes (Signed)
PULMONARY / CRITICAL CARE MEDICINE   Name: [name] MRN: [id number] DOB: [birth year]    ADMISSION DATE:  02/18/2017 CONSULTATION DATE:  02/18/17  REFERRING MD:  Gwendlyn Deutscher  CHIEF COMPLAINT:  SOB  HISTORY OF PRESENT ILLNESS:  [name] is a 46 y.o. female  With asthma admitted with an asthma exacerbation requiring intubation.  She developed AKI afterwards.    SUBJECTIVE:   Sedated, no acute events   VITAL SIGNS: BP 135/88   Pulse 79   Temp (!) 101 F (38.3 C) (Oral)   Resp 18   Ht 5\' 3"  (1.6 m)   Wt 109.5 kg (241 lb 6.5 oz)   SpO2 100%   BMI 42.76 kg/m   HEMODYNAMICS:   VENTILATOR SETTINGS: Vent Mode: PCV FiO2 (%):  [40 %-50 %] 40 % Set Rate:  [18 bmp] 18 bmp Vt Set:  [350 mL] 350 mL PEEP:  [5 cmH20-10 cmH20] 10 cmH20 Pressure Support:  [10 cmH20] 10 cmH20 Plateau Pressure:  [25 cmH20-26 cmH20] 26 cmH20  INTAKE / OUTPUT:  Intake/Output Summary (Last 24 hours) at 02/22/17 1029 Last data filed at 02/22/17 0900  Gross per 24 hour  Intake          2387.83 ml  Output             4112 ml  Net         -1724.17 ml    General:  Resting comfortably in bed HENT: NCAT ETT in place PULM: Wheezing bilaterally, normal effort CV: RRR, no mgr GI: BS+, soft, nontender MSK: normal bulk and tone Neuro: sedated on vent   LABS:  BMET  Recent Labs Lab 02/21/17 0407 02/21/17 1803 02/22/17 0356  NA 139 136 137  K 4.5 4.3 4.0  CL 102 102 101  CO2 30 27 27   BUN 21* 26* 30*  CREATININE 1.29* 1.32* 1.22*  GLUCOSE 200* 202* 203*    Electrolytes  Recent Labs Lab 02/20/17 0335  02/21/17 0407 02/21/17 0408 02/21/17 1803 02/22/17 0356  CALCIUM  --   < > 8.0*  --  7.9* 8.1*  MG 1.8  --   --  2.2  --  2.4  PHOS  --   < > 3.1  --  2.1* 1.9*  < > = values in this interval not displayed.  CBC  Recent Labs Lab 02/20/17 0335 02/20/17 0553 02/21/17 0408 02/22/17 0356  WBC 17.5*  --  19.9* 18.5*  HGB 9.9* 10.2* 10.6* 10.5*   HCT 31.3* 30.0* 35.0* 33.4*  PLT 237  --  260 235    Coag's No results for input(s): APTT, INR in the last 168 hours.  Sepsis Markers  Recent Labs Lab 02/19/17 1536 02/19/17 1540 02/20/17 0335 02/21/17 0408  LATICACIDVEN 2.8* 2.3*  --   --   PROCALCITON 0.24  --  0.14 0.13    ABG  Recent Labs Lab 02/19/17 1207 02/20/17 0730 02/22/17 0410  PHART 7.139* 7.371 7.360  PCO2ART 60.2* 62.0* 49.1*  PO2ART 116.0* 91.3 88.4    Liver Enzymes  Recent Labs Lab 02/21/17 0407 02/21/17 1803 02/22/17 0356  ALBUMIN 2.7* 2.7* 2.5*    Cardiac Enzymes  Recent Labs Lab 02/20/17 1047 02/20/17 1600 02/20/17 2247  TROPONINI 0.75* 0.61* 0.44*    Glucose  Recent Labs Lab 02/21/17 1136 02/21/17 1542 02/21/17 2000 02/22/17 0041 02/22/17 0359 02/22/17 0749  GLUCAP 216* 180* 171* 189* 198* 211*    Imaging Dg Chest Port 1 View  Result Date: 02/22/2017  CLINICAL DATA:  Respiratory failure. EXAM: PORTABLE CHEST 1 VIEW COMPARISON:  02/21/2017 and prior exams FINDINGS: An endotracheal tube with tip 2 cm above carina, right IJ central venous catheter with tip overlying the upper SVC and NG tube entering the stomach again noted. Upper limits normal heart size noted. Improved left basilar aeration and decreased vascular congestion noted. There is no evidence of pneumothorax. IMPRESSION: Improved left basilar aeration with decreased pulmonary vascular congestion. Electronically Signed   By: Margarette Canada M.D.   On: 02/22/2017 07:25    STUDIES:  TTE 6/1 >  CULTURES: 5/31 Sputum  >  5/31 RVP  > + rhinovirus/enterovirus 5/31 BCx 2 >   6/3 blood culture >  6/3 resp culture >   ANTIBIOTICS: Rocephin 5/31x1  SIGNIFICANT EVENTS: 5/30 > admit for acute asthma exacerbation, intubated 5/31 > severe vent dyssynchrony, paralyzed, CRRT 6/1 > remained on CRRT and NMB 6/2 > nimbex off, changed to pressure control  LINES/TUBES: ETT 5/30 >  OGT 5/30 > Foley 5/30 > R IJ HD 5/31  > PIV   DISCUSSION: 46 y.o. female admitted 5/30 with asthma exacerbation that failed conservative management and required intubation.  Rhinovirus positive.  Also with AKI presumably from poor forward flow/hypotension in setting of severe air trapping.  ASSESSMENT / PLAN:  PULMONARY A: Asthma exacerbation -severe: improving today, intrinsic PEEP only 15 (had been 30) Vent dyssychrony Tobacco dependence  P:   Add brovana, pulmicort Decrease frequency of albuterol Continue full vent support: Continue pressure control, keep PEEP set at 10 Decrease solumedrol to 40mg  IV q8h VAP prevention  CARDIOVASCULAR A:  Demand ischemia Hx HTN, HLD P:  Tele F/u echo  RENAL A:   AKI - on CVVHD  P:   CVVHD per renal Monitor BMET and UOP Replace electrolytes as needed  GASTROINTESTINAL A:   GERD P:   Continue tube feeding Pantoprazole for stress ulcer prophylaxis  HEMATOLOGIC A:   Anemia without bleeding P:  Monitor CBC Continue heparin for DVT prophylaxis   INFECTIOUS A:   Leukocytosis due to steroids 6/3 Fever, no clear source of infection CXR clear + rhinovirus 5/31 P:   Holding antibiotics Re-culture 6/3  ENDOCRINE A:  Hyperglycemia  > up DM2 P:   SSI  Increase glargine to 45 units  NEUROLOGIC A:   Acute encephalopathy - due to sedation Hx depression, chronic back pain, migraines P:   PAD protocol  RASS goal -3   Family updated: Husband updated at bedside   My cc time 33 minutes  Roselie Awkward, MD [facility] Pager: [phone] Cell: [phone] After 3pm or if no response, call [phone]

## 2017-02-22 NOTE — Progress Notes (Signed)
RN notified of pt temp of 101 F orally.

## 2017-02-22 NOTE — Progress Notes (Signed)
  Echocardiogram 2D Echocardiogram has been performed.  [name] 02/22/2017, 3:21 PM

## 2017-02-23 ENCOUNTER — Inpatient Hospital Stay (HOSPITAL_COMMUNITY): Payer: Medicare Other

## 2017-02-23 LAB — CBC WITH DIFFERENTIAL/PLATELET
Basophils Absolute: 0 K/uL (ref 0.0–0.1)
Basophils Relative: 0 %
Eosinophils Absolute: 0 K/uL (ref 0.0–0.7)
Eosinophils Relative: 0 %
HCT: 31.2 % — ABNORMAL LOW (ref 36.0–46.0)
Hemoglobin: 10.1 g/dL — ABNORMAL LOW (ref 12.0–15.0)
Lymphocytes Relative: 12 %
Lymphs Abs: 2 K/uL (ref 0.7–4.0)
MCH: 28.5 pg (ref 26.0–34.0)
MCHC: 32.4 g/dL (ref 30.0–36.0)
MCV: 87.9 fL (ref 78.0–100.0)
Monocytes Absolute: 1.5 K/uL — ABNORMAL HIGH (ref 0.1–1.0)
Monocytes Relative: 9 %
Neutro Abs: 13.2 K/uL — ABNORMAL HIGH (ref 1.7–7.7)
Neutrophils Relative %: 79 %
Platelets: 220 K/uL (ref 150–400)
RBC: 3.55 MIL/uL — ABNORMAL LOW (ref 3.87–5.11)
RDW: 14.9 % (ref 11.5–15.5)
WBC: 16.8 K/uL — ABNORMAL HIGH (ref 4.0–10.5)

## 2017-02-23 LAB — GLUCOSE, CAPILLARY
GLUCOSE-CAPILLARY: 181 mg/dL — AB (ref 65–99)
GLUCOSE-CAPILLARY: 148 mg/dL — AB (ref 65–99)
Glucose-Capillary: 129 mg/dL — ABNORMAL HIGH (ref 65–99)
Glucose-Capillary: 184 mg/dL — ABNORMAL HIGH (ref 65–99)
Glucose-Capillary: 185 mg/dL — ABNORMAL HIGH (ref 65–99)
Glucose-Capillary: 197 mg/dL — ABNORMAL HIGH (ref 65–99)
Glucose-Capillary: 243 mg/dL — ABNORMAL HIGH (ref 65–99)

## 2017-02-23 LAB — RENAL FUNCTION PANEL
ANION GAP: 6 (ref 5–15)
Albumin: 2.5 g/dL — ABNORMAL LOW (ref 3.5–5.0)
BUN: 49 mg/dL — ABNORMAL HIGH (ref 6–20)
CALCIUM: 8.2 mg/dL — AB (ref 8.9–10.3)
CHLORIDE: 103 mmol/L (ref 101–111)
CO2: 27 mmol/L (ref 22–32)
Creatinine, Ser: 1.38 mg/dL — ABNORMAL HIGH (ref 0.44–1.00)
GFR calc Af Amer: 52 mL/min — ABNORMAL LOW (ref >60)
GFR calc non Af Amer: 45 mL/min — ABNORMAL LOW (ref >60)
GLUCOSE: 196 mg/dL — AB (ref 65–99)
Phosphorus: 2.7 mg/dL (ref 2.5–4.6)
Potassium: 3.9 mmol/L (ref 3.5–5.1)
SODIUM: 136 mmol/L (ref 135–145)

## 2017-02-23 LAB — MAGNESIUM: Magnesium: 2.5 mg/dL — ABNORMAL HIGH (ref 1.7–2.4)

## 2017-02-23 MED ORDER — ENOXAPARIN SODIUM 30 MG/0.3ML ~~LOC~~ SOLN
30.0000 mg | SUBCUTANEOUS | Status: DC
  Administered 2017-02-24 – 2017-02-25 (×2): 30 mg via SUBCUTANEOUS
  Filled 2017-02-23 (×2): qty 0.3

## 2017-02-23 MED ORDER — BISACODYL 10 MG RE SUPP
10.0000 mg | Freq: Every day | RECTAL | Status: DC | PRN
  Administered 2017-02-24: 10 mg via RECTAL
  Filled 2017-02-23: qty 1

## 2017-02-23 MED ORDER — DOCUSATE SODIUM 50 MG/5ML PO LIQD: 100.0000 mg | Freq: Two times a day (BID) | ORAL | Status: DC | PRN

## 2017-02-23 MED ORDER — FENTANYL BOLUS VIA INFUSION
50.0000 ug | INTRAVENOUS | Status: DC | PRN
  Administered 2017-02-24: 50 ug via INTRAVENOUS
  Filled 2017-02-23: qty 50

## 2017-02-23 MED ORDER — INSULIN GLARGINE 100 UNIT/ML ~~LOC~~ SOLN
65.0000 [IU] | SUBCUTANEOUS | Status: DC
  Administered 2017-02-23: 65 [IU] via SUBCUTANEOUS
  Filled 2017-02-23 (×3): qty 0.65

## 2017-02-23 MED ORDER — MIDAZOLAM HCL 2 MG/2ML IJ SOLN
2.0000 mg | INTRAMUSCULAR | Status: DC | PRN
  Administered 2017-02-24: 2 mg via INTRAVENOUS
  Filled 2017-02-23: qty 2

## 2017-02-23 MED ORDER — MIDAZOLAM HCL 2 MG/2ML IJ SOLN
2.0000 mg | INTRAMUSCULAR | Status: AC | PRN
  Administered 2017-02-23 – 2017-02-24 (×3): 2 mg via INTRAVENOUS
  Filled 2017-02-23 (×3): qty 2

## 2017-02-23 MED ORDER — FENTANYL 2500MCG IN NS 250ML (10MCG/ML) PREMIX INFUSION
25.0000 ug/h | INTRAVENOUS | Status: DC
  Administered 2017-02-23 (×2): 200 ug/h via INTRAVENOUS
  Administered 2017-02-24: 150 ug/h via INTRAVENOUS
  Filled 2017-02-23 (×3): qty 250

## 2017-02-23 MED ORDER — FENTANYL CITRATE (PF) 100 MCG/2ML IJ SOLN: 50.0000 ug | Freq: Once | INTRAMUSCULAR | Status: DC

## 2017-02-23 MED ORDER — METHYLPREDNISOLONE SODIUM SUCC 40 MG IJ SOLR
40.0000 mg | Freq: Two times a day (BID) | INTRAMUSCULAR | Status: DC
  Administered 2017-02-24: 40 mg via INTRAVENOUS
  Filled 2017-02-23 (×2): qty 1

## 2017-02-23 NOTE — Progress Notes (Signed)
Patient ID: [name], female   DOB: 10/18/1970, 46 y.o.   MRN: [id number] [facility] Progress Note    Subjective:    CRRT stopped yesterday UOP of 925 ml recorded with large amount clear urine in foley now   Objective:   I/O last 3 completed shifts: In: 4044.8 [I.V.:1463.8; Other:30; NG/GT:2465; IV Piggyback:86] Out: 5095 [Urine:1155; Other:3940] No intake/output data recorded.   Physical Exam: Appears comfortable on the vent VS as noted R IJ temp HD cath/dru dressing (5/31) Intubated Responds to pain/opens eyes Lungs w/insp squeak/soft exp wheeze esp on the left Tachy regular low 100's S1S2 No S3 and no murmur that I can hear Abd obese, no focal tenderness SCD's in place foley clear yellow urine  Imaging: Dg Chest Port 1 View  Result Date: 02/23/2017 CLINICAL DATA:  Chronic respiratory failure. EXAM: PORTABLE CHEST 1 VIEW COMPARISON:  02/22/2017. FINDINGS: Endotracheal tube, NG tube, right IJ line stable position. Low lung volumes. Mild infiltrate left upper lobe and both lung bases. No pleural effusion or pneumothorax. Heart size stable. IMPRESSION: 1. Lines and tubes in stable position. 2. Low lung volumes. Mild infiltrates left upper lobe and both lung bases. Electronically Signed   By: Thomas  Register   On: 02/23/2017 06:34   Dg Chest Port 1 View  Result Date: 02/22/2017 CLINICAL DATA:  Respiratory failure. EXAM: PORTABLE CHEST 1 VIEW COMPARISON:  02/21/2017 and prior exams FINDINGS: An endotracheal tube with tip 2 cm above carina, right IJ central venous catheter with tip overlying the upper SVC and NG tube entering the stomach again noted. Upper limits normal heart size noted. Improved left basilar aeration and decreased vascular congestion noted. There is no evidence of pneumothorax. IMPRESSION: Improved left basilar aeration with decreased pulmonary vascular congestion. Electronically Signed   By: Jeffrey  Hu M.D.   On: 02/22/2017 07:25     Recent Labs Lab 02/20/17 0440 02/20/17 0553 02/20/17 1600 02/21/17 0407 02/21/17 1803 02/22/17 0356 02/22/17 1800 02/23/17 0322  NA 138 140 138 139 136 137 135 136  K 2.9* 2.9* 3.8 4.5 4.3 4.0 4.1 3.9  CL 98* 92* 99* 102 102 101 101 103  CO2 31  --  32 30 27 27 26 27  GLUCOSE 187* 201* 177* 200* 202* 203* 230* 196*  BUN 12 15 13 21* 26* 30* 33* 49*  CREATININE 1.13* 1.10* 1.22* 1.29* 1.32* 1.22* 1.08* 1.38*  CALCIUM 8.3*  --  8.2* 8.0* 7.9* 8.1* 8.1* 8.2*  PHOS 2.6  --  3.3 3.1 2.1* 1.9* 3.4 2.7     Recent Labs Lab 02/20/17 0335 02/20/17 0553 02/21/17 0408 02/22/17 0356 02/23/17 0322  WBC 17.5*  --  19.9* 18.5* 16.8*  NEUTROABS  --   --   --   --  13.2*  HGB 9.9* 10.2* 10.6* 10.5* 10.1*  HCT 31.3* 30.0* 35.0* 33.4* 31.2*  MCV 89.2  --  93.1 90.8 87.9  PLT 237  --  260 235 220    Medications:    . albuterol  2.5 mg Nebulization Q6H  . arformoterol  15 mcg Nebulization BID  . aspirin  325 mg Per Tube Daily  . budesonide (PULMICORT) nebulizer solution  0.5 mg Nebulization BID  . chlorhexidine gluconate (MEDLINE KIT)  15 mL Mouth Rinse BID  . Chlorhexidine Gluconate Cloth  6 each Topical Daily  . enoxaparin (LOVENOX) injection  55 mg Subcutaneous Q24H  . feeding supplement (VITAL HIGH PROTEIN)  1,000 mL Per Tube Q24H  . fentaNYL (  SUBLIMAZE) injection  100 mcg Intravenous Once  . insulin aspart  0-20 Units Subcutaneous Q4H  . insulin aspart  5 Units Subcutaneous Q4H  . insulin glargine  45 Units Subcutaneous Q24H  . mouth rinse  15 mL Mouth Rinse QID  . methylPREDNISolone (SOLU-MEDROL) injection  40 mg Intravenous Q8H  . midazolam  2 mg Intravenous Once  . olopatadine  1 drop Right Eye BID  . pantoprazole sodium  40 mg Per Tube QHS  . polyethylene glycol  17 g Oral BID  . potassium chloride  40 mEq Per Tube Once  . senna-docusate  1 tablet Oral BID  . simvastatin  10 mg Per Tube q1800  . sodium chloride flush  10-40 mL Intracatheter Q12H  . topiramate   100 mg Oral BID   . sodium chloride 10 mL/hr at 02/23/17 0400  . dextrose    . fentaNYL infusion INTRAVENOUS 200 mcg/hr (02/23/17 0400)  . insulin (NOVOLIN-R) infusion Stopped (02/20/17 1803)  . midazolam (VERSED) infusion 4 mg/hr (02/23/17 0400)  . dialysis replacement fluid (prismasate) Stopped (02/22/17 1855)  . dialysis replacement fluid (prismasate) Stopped (02/22/17 1855)  . dialysate (PRISMASATE) Stopped (02/22/17 1855)  . sodium chloride     Background:  46 yo F. PMH DM, HTN, HLD, asthma. Outpt meds (possibly)  included losartan, naproxyn, farxiga. BL creatinine 1.0 (02/18/17). Presented w/acute asthma, intubated 5/30 after failed conservative mgmt. Cx + rhinovirus. Developed oligoanuric AKI, metabolic acidosis, hyperkalemia. (hypotension, no IV contrast, single dose ibuprofen). CRRT initiated 5/31->stopped 6/3.  Assessment/ Plan:    1. AKI: Normal creatinine at baseline (1.0)/UA neg for protein or blood 07/2016. Suspect current situation  hemodynamically mediated. CRRT 5/31->6/3 c/correction hyperkalemia and anion gap acidosis. Since D/C of CRRT on 6/3 has had 925 cc UOP. Creatinine rising off CRRT (as expected) but hope to see level off if/when starts recovering GFR). With current BP could tolerate intermittent HD but no needs today.  2. Acute asthma exacerbation/VDRF - CCM managing.  3. Anemia: mild/Hb stable around 10. Likely acute illness. No Fe studies available. 4. Leukocytosis - persistent. Felt steroid related.  5. DM - currently glargine + SSI   , MD [facility] [phone] Pager 02/23/2017, 8:22 AM     

## 2017-02-23 NOTE — Progress Notes (Signed)
This note also relates to the following rows which could not be included: SpO2 - Cannot attach notes to unvalidated device data  Vent changes made by MD.

## 2017-02-23 NOTE — Progress Notes (Signed)
PULMONARY / CRITICAL CARE MEDICINE   Name: [name] MRN: [id number] DOB: 12-22-1970    ADMISSION DATE:  02/18/2017 CONSULTATION DATE:  02/18/17  REFERRING MD:  Gwendlyn Deutscher  CHIEF COMPLAINT:  SOB  HISTORY OF PRESENT ILLNESS:  [name] is a 46 y.o. female  With asthma admitted with an asthma exacerbation requiring intubation.  She developed AKI afterwards.    SUBJECTIVE:   Off CVVHD Wheezing improved   VITAL SIGNS: BP (!) 141/86   Pulse 98   Temp 99.4 F (37.4 C) (Oral)   Resp 18   Ht 5\' 3"  (1.6 m)   Wt 108.7 kg (239 lb 10.2 oz)   SpO2 98%   BMI 42.45 kg/m   HEMODYNAMICS:   VENTILATOR SETTINGS: Vent Mode: PCV FiO2 (%):  [40 %] 40 % Set Rate:  [18 bmp] 18 bmp PEEP:  [10 cmH20] 10 cmH20 Plateau Pressure:  [24 cmH20-26 cmH20] 25 cmH20  INTAKE / OUTPUT:  Intake/Output Summary (Last 24 hours) at 02/23/17 1121 Last data filed at 02/23/17 0700  Gross per 24 hour  Intake             2188 ml  Output             2055 ml  Net              133 ml    General:  Resting comfortably in bed HENT: NCAT ETT in place PULM: CTA B, vent supported breathing CV: RRR, no mgr GI: BS+, soft, nontender MSK: normal bulk and tone Neuro: sedated on vent     LABS:  BMET  Recent Labs Lab 02/22/17 0356 02/22/17 1800 02/23/17 0322  NA 137 135 136  K 4.0 4.1 3.9  CL 101 101 103  CO2 27 26 27   BUN 30* 33* 49*  CREATININE 1.22* 1.08* 1.38*  GLUCOSE 203* 230* 196*    Electrolytes  Recent Labs Lab 02/21/17 0408  02/22/17 0356 02/22/17 1800 02/23/17 0322  CALCIUM  --   < > 8.1* 8.1* 8.2*  MG 2.2  --  2.4  --  2.5*  PHOS  --   < > 1.9* 3.4 2.7  < > = values in this interval not displayed.  CBC  Recent Labs Lab 02/21/17 0408 02/22/17 0356 02/23/17 0322  WBC 19.9* 18.5* 16.8*  HGB 10.6* 10.5* 10.1*  HCT 35.0* 33.4* 31.2*  PLT 260 235 220    Coag's No results for input(s): APTT, INR in the last 168 hours.  Sepsis  Markers  Recent Labs Lab 02/19/17 1536 02/19/17 1540 02/20/17 0335 02/21/17 0408  LATICACIDVEN 2.8* 2.3*  --   --   PROCALCITON 0.24  --  0.14 0.13    ABG  Recent Labs Lab 02/19/17 1207 02/20/17 0730 02/22/17 0410  PHART 7.139* 7.371 7.360  PCO2ART 60.2* 62.0* 49.1*  PO2ART 116.0* 91.3 88.4    Liver Enzymes  Recent Labs Lab 02/22/17 0356 02/22/17 1800 02/23/17 0322  ALBUMIN 2.5* 2.6* 2.5*    Cardiac Enzymes  Recent Labs Lab 02/20/17 1047 02/20/17 1600 02/20/17 2247  TROPONINI 0.75* 0.61* 0.44*    Glucose  Recent Labs Lab 02/22/17 1625 02/22/17 2029 02/23/17 0037 02/23/17 0334 02/23/17 0840 02/23/17 1102  GLUCAP 189* 265* 184* 181* 185* 243*    Imaging Dg Chest Port 1 View  Result Date: 02/23/2017 CLINICAL DATA:  Chronic respiratory failure. EXAM: PORTABLE CHEST 1 VIEW COMPARISON:  02/22/2017. FINDINGS: Endotracheal tube, NG tube, right IJ line stable position. Low lung  volumes. Mild infiltrate left upper lobe and both lung bases. No pleural effusion or pneumothorax. Heart size stable. IMPRESSION: 1. Lines and tubes in stable position. 2. Low lung volumes. Mild infiltrates left upper lobe and both lung bases. Electronically Signed   By: Marcello Moores  Register   On: 02/23/2017 06:34    STUDIES:  TTE 6/1 > LVEF 65% mild LVH, RV normal  CULTURES: 5/31 Sputum  >  5/31 RVP  > + rhinovirus/enterovirus 5/31 BCx 2 >   6/3 blood culture >  6/3 resp culture >   ANTIBIOTICS: Rocephin 5/31x1  SIGNIFICANT EVENTS: 5/30 > admit for acute asthma exacerbation, intubated 5/31 > severe vent dyssynchrony, paralyzed, CRRT 6/1 > remained on CRRT and NMB 6/2 > nimbex off, changed to pressure control 6/3 > held CRRT  LINES/TUBES: ETT 5/30 >  OGT 5/30 > Foley 5/30 > R IJ HD 5/31 > PIV   DISCUSSION: 46 y.o. female admitted 5/30 with asthma exacerbation that failed conservative management and required intubation.  Rhinovirus positive.  Also with AKI presumably  from poor forward flow/hypotension in setting of severe air trapping. As of 6/4 her air movement and air trapping are better.  ASSESSMENT / PLAN:  PULMONARY A: Asthma exacerbation -severe: improving today, air trapping Vent dysychrony > improved Acute respiratory failure with hypoxemia Tobacco dependence  P:   Continue brovana/pulmicort Decrease solumedrol to 40mg  IV q12h Change vent to PRVC 8cc/kg PEEP 5 then pressure support VAP prevention  CARDIOVASCULAR A:  Demand ischemia Hx HTN, HLD P:  Tele F/u echo  RENAL A:   AKI -required CVVHD > now making urine  P:   CVVHD on hold Monitor BMET and UOP Replace electrolytes as needed  GASTROINTESTINAL A:   GERD P:   Continue tube feeding Pantoprazole for stress ulcer prophylaxis  HEMATOLOGIC A:   Anemia without bleeding P:  Monitor CBC Continue heparin for DVT prophylaxis  INFECTIOUS A:   Leukocytosis due to steroids 6/3 Fever, no clear source of infection CXR clear + rhinovirus 5/31 P:   Monitor cultures Hold antibiotics  ENDOCRINE A:  Hyperglycemia  > up DM2 P:   SSI  Increase glargine to 65 units  NEUROLOGIC A:   Acute encephalopathy - due to sedation Hx depression, chronic back pain, migraines P:   PAD protocol  RASS goal -1   Family updated: Husband updated at bedside 6/4  My cc time 35 minutes  Roselie Awkward, MD [facility] Pager: [phone] Cell: [phone] After 3pm or if no response, call [phone]

## 2017-02-23 NOTE — Progress Notes (Signed)
[name] is a 46 yo F that is being cared for in the ICU after severe asthma exacerbation requiring intubationAppreciate excellent care being provided by the CCM team. Family Medicine Teaching Service to assume care once patient appropriate for transfer to our service.   Marjie Skiff, MD Family Medicine

## 2017-02-24 ENCOUNTER — Inpatient Hospital Stay (HOSPITAL_COMMUNITY): Payer: Medicare Other

## 2017-02-24 ENCOUNTER — Ambulatory Visit: Payer: Medicare Other | Admitting: Family Medicine

## 2017-02-24 LAB — GLUCOSE, CAPILLARY
GLUCOSE-CAPILLARY: 112 mg/dL — AB (ref 65–99)
Glucose-Capillary: 138 mg/dL — ABNORMAL HIGH (ref 65–99)
Glucose-Capillary: 148 mg/dL — ABNORMAL HIGH (ref 65–99)
Glucose-Capillary: 246 mg/dL — ABNORMAL HIGH (ref 65–99)
Glucose-Capillary: 94 mg/dL (ref 65–99)

## 2017-02-24 LAB — RENAL FUNCTION PANEL
ALBUMIN: 2.5 g/dL — AB (ref 3.5–5.0)
Anion gap: 10 (ref 5–15)
BUN: 54 mg/dL — AB (ref 6–20)
CALCIUM: 8.6 mg/dL — AB (ref 8.9–10.3)
CO2: 27 mmol/L (ref 22–32)
CREATININE: 1.41 mg/dL — AB (ref 0.44–1.00)
Chloride: 104 mmol/L (ref 101–111)
GFR calc Af Amer: 50 mL/min — ABNORMAL LOW (ref >60)
GFR calc non Af Amer: 44 mL/min — ABNORMAL LOW (ref >60)
GLUCOSE: 126 mg/dL — AB (ref 65–99)
PHOSPHORUS: 3.7 mg/dL (ref 2.5–4.6)
POTASSIUM: 3.7 mmol/L (ref 3.5–5.1)
SODIUM: 141 mmol/L (ref 135–145)

## 2017-02-24 LAB — CBC WITH DIFFERENTIAL/PLATELET
BASOS ABS: 0 10*3/uL (ref 0.0–0.1)
Basophils Relative: 0 %
EOS ABS: 0 10*3/uL (ref 0.0–0.7)
Eosinophils Relative: 0 %
HCT: 33.8 % — ABNORMAL LOW (ref 36.0–46.0)
Hemoglobin: 10.8 g/dL — ABNORMAL LOW (ref 12.0–15.0)
LYMPHS PCT: 15 %
Lymphs Abs: 2.8 10*3/uL (ref 0.7–4.0)
MCH: 28.3 pg (ref 26.0–34.0)
MCHC: 32 g/dL (ref 30.0–36.0)
MCV: 88.5 fL (ref 78.0–100.0)
MONO ABS: 2.6 10*3/uL — AB (ref 0.1–1.0)
Monocytes Relative: 14 %
Neutro Abs: 13.3 10*3/uL — ABNORMAL HIGH (ref 1.7–7.7)
Neutrophils Relative %: 71 %
PLATELETS: 251 10*3/uL (ref 150–400)
RBC: 3.82 MIL/uL — ABNORMAL LOW (ref 3.87–5.11)
RDW: 15.3 % (ref 11.5–15.5)
WBC: 18.7 10*3/uL — ABNORMAL HIGH (ref 4.0–10.5)

## 2017-02-24 LAB — CULTURE, BLOOD (ROUTINE X 2)
CULTURE: NO GROWTH
Culture: NO GROWTH
SPECIAL REQUESTS: ADEQUATE
Special Requests: ADEQUATE

## 2017-02-24 MED ORDER — CHLORHEXIDINE GLUCONATE 0.12 % MT SOLN: 15.0000 mL | Freq: Two times a day (BID) | OROMUCOSAL | Status: DC

## 2017-02-24 MED ORDER — LOSARTAN POTASSIUM 50 MG PO TABS
50.0000 mg | ORAL_TABLET | Freq: Every day | ORAL | Status: DC
  Filled 2017-02-24: qty 1

## 2017-02-24 MED ORDER — AMLODIPINE 1 MG/ML ORAL SUSPENSION: 5.0000 mg | Freq: Every day | ORAL | Status: DC

## 2017-02-24 MED ORDER — HYDRALAZINE HCL 20 MG/ML IJ SOLN
10.0000 mg | INTRAMUSCULAR | Status: DC | PRN
  Administered 2017-02-24 – 2017-02-26 (×4): 10 mg via INTRAVENOUS
  Filled 2017-02-24 (×5): qty 1

## 2017-02-24 MED ORDER — ORAL CARE MOUTH RINSE: 15.0000 mL | Freq: Two times a day (BID) | OROMUCOSAL | Status: DC

## 2017-02-24 MED ORDER — CHLORHEXIDINE GLUCONATE 0.12 % MT SOLN
15.0000 mL | Freq: Two times a day (BID) | OROMUCOSAL | Status: DC
  Administered 2017-02-24: 15 mL via OROMUCOSAL

## 2017-02-24 MED ORDER — ORAL CARE MOUTH RINSE
15.0000 mL | Freq: Two times a day (BID) | OROMUCOSAL | Status: DC
  Administered 2017-02-24 (×2): 15 mL via OROMUCOSAL

## 2017-02-24 MED ORDER — AMLODIPINE BESYLATE 5 MG PO TABS
5.0000 mg | ORAL_TABLET | Freq: Every day | ORAL | Status: DC
  Administered 2017-02-25: 5 mg
  Filled 2017-02-24: qty 1

## 2017-02-24 MED ORDER — FENTANYL CITRATE (PF) 100 MCG/2ML IJ SOLN
12.5000 ug | INTRAMUSCULAR | Status: DC | PRN
  Administered 2017-02-25: 25 ug via INTRAVENOUS
  Administered 2017-02-25: 12.5 ug via INTRAVENOUS
  Administered 2017-02-25 – 2017-02-27 (×5): 25 ug via INTRAVENOUS
  Filled 2017-02-24 (×7): qty 2

## 2017-02-24 MED ORDER — METHYLPREDNISOLONE SODIUM SUCC 40 MG IJ SOLR
40.0000 mg | Freq: Every day | INTRAMUSCULAR | Status: DC
  Administered 2017-02-24 – 2017-02-26 (×3): 40 mg via INTRAVENOUS
  Filled 2017-02-24 (×3): qty 1

## 2017-02-24 MED ORDER — DEXTROSE 5 % IV SOLN
500.0000 mg | INTRAVENOUS | Status: DC
  Administered 2017-02-24 – 2017-02-26 (×3): 500 mg via INTRAVENOUS
  Filled 2017-02-24 (×4): qty 500

## 2017-02-24 MED ORDER — SERTRALINE HCL 50 MG PO TABS
50.0000 mg | ORAL_TABLET | Freq: Every day | ORAL | Status: DC
  Administered 2017-02-25: 50 mg
  Filled 2017-02-24 (×2): qty 1

## 2017-02-24 NOTE — Progress Notes (Addendum)
Patient ID: [name], female   DOB: Oct 26, 1970, 46 y.o.   MRN: [id number] [facility] Progress Note    Subjective:    CRRT stopped 6/4 UOP has picked up and creatinine leveling off at around 1.4 off CRRT Awake, following commands   Objective:   I/O last 3 completed shifts: In: 3788.6 [I.V.:1193.6; NG/GT:2595] Out: 2420 [Urine:2420] Total I/O In: 278.7 [I.V.:18.7; NG/GT:260] Out: 285 [Urine:285]  Physical Exam: Appears comfortable on the vent BP (!) 195/98   Pulse (!) 122   Temp 99.6 F (37.6 C) (Oral)   Resp (!) 25   Ht _0  (1.6 m)   Wt 109 kg (240 lb 4.8 oz)   SpO2 99%   BMI 42.57 kg/m   R IJ temp HD cath/dru dressing (5/31) Intubated Awake and follows commands Lungs ant clear today Tachy regular low 100's S1S2 No S3 and no murmur that I can hear Abd obese, no focal tenderness SCD's in place foley clear yellow urine  Imaging: Dg Chest Port 1 View  Result Date: 02/24/2017 CLINICAL DATA:  Acute on chronic respiratory failure EXAM: PORTABLE CHEST 1 VIEW COMPARISON:  Beneath portable chest x-ray of February 23, 2017 FINDINGS: The lungs arm adequately inflated. Bibasilar interstitial densities are present and more conspicuous today with partial obscuration of the left hemidiaphragm. The cardiac silhouette is enlarged. The pulmonary vascularity is not engorged. The endotracheal tube tip lies approximately 4.5 cm above the carina. The esophagogastric tube tip in proximal port project below the inferior margin of the image. The right internal jugular catheter tip projects over the junction of the proximal and midportions of the SVC. IMPRESSION: Slight interval increase in bibasilar subsegmental atelectasis. There may be small left pleural effusion. The support tubes are in reasonable position. Electronically Signed   By: David  Martinique M.D.   On: 02/24/2017 07:09   Dg Chest Port 1 View  Result Date: 02/23/2017 CLINICAL DATA:  Chronic respiratory  failure. EXAM: PORTABLE CHEST 1 VIEW COMPARISON:  02/22/2017. FINDINGS: Endotracheal tube, NG tube, right IJ line stable position. Low lung volumes. Mild infiltrate left upper lobe and both lung bases. No pleural effusion or pneumothorax. Heart size stable. IMPRESSION: 1. Lines and tubes in stable position. 2. Low lung volumes. Mild infiltrates left upper lobe and both lung bases. Electronically Signed   ByMarcello Moores  Register   On: 02/23/2017 06:34    Recent Labs Lab 02/20/17 1600 02/21/17 0407 02/21/17 1803 02/22/17 0356 02/22/17 1800 02/23/17 0322 02/24/17 0324  NA 138 139 136 137 135 136 141  K 3.8 4.5 4.3 4.0 4.1 3.9 3.7  CL 99* 102 102 101 101 103 104  CO2 32 _1 GLUCOSE 177* 200* 202* 203* 230* 196* 126*  BUN 13 21* 26* 30* 33* 49* 54*  CREATININE 1.22* 1.29* 1.32* 1.22* 1.08* 1.38* 1.41*  CALCIUM 8.2* 8.0* 7.9* 8.1* 8.1* 8.2* 8.6*  PHOS 3.3 3.1 2.1* 1.9* 3.4 2.7 3.7     Recent Labs Lab 02/21/17 0408 02/22/17 0356 02/23/17 0322 02/24/17 0324  WBC 19.9* 18.5* 16.8* 18.7*  NEUTROABS  --   --  13.2* 13.3*  HGB 10.6* 10.5* 10.1* 10.8*  HCT 35.0* 33.4* 31.2* 33.8*  MCV 93.1 90.8 87.9 88.5  PLT 260 235 220 251    Medications:    . albuterol  2.5 mg Nebulization Q6H  . amLODipine  5 mg Per Tube Daily  . arformoterol  15 mcg Nebulization BID  . aspirin  325  mg Per Tube Daily  . budesonide (PULMICORT) nebulizer solution  0.5 mg Nebulization BID  . chlorhexidine gluconate (MEDLINE KIT)  15 mL Mouth Rinse BID  . Chlorhexidine Gluconate Cloth  6 each Topical Daily  . enoxaparin (LOVENOX) injection  30 mg Subcutaneous Q24H  . feeding supplement (VITAL HIGH PROTEIN)  1,000 mL Per Tube Q24H  . fentaNYL (SUBLIMAZE) injection  50 mcg Intravenous Once  . insulin aspart  0-20 Units Subcutaneous Q4H  . insulin aspart  5 Units Subcutaneous Q4H  . insulin glargine  65 Units Subcutaneous Q24H  . losartan  50 mg Oral Daily  . mouth rinse  15 mL Mouth Rinse QID  .  methylPREDNISolone (SOLU-MEDROL) injection  40 mg Intravenous Q12H  . olopatadine  1 drop Right Eye BID  . pantoprazole sodium  40 mg Per Tube QHS  . polyethylene glycol  17 g Oral BID  . potassium chloride  40 mEq Per Tube Once  . senna-docusate  1 tablet Oral BID  . simvastatin  10 mg Per Tube q1800  . sodium chloride flush  10-40 mL Intracatheter Q12H  . topiramate  100 mg Oral BID   . sodium chloride 10 mL/hr at 02/24/17 0921  . dextrose    . fentaNYL infusion INTRAVENOUS 50 mcg/hr (02/24/17 0936)  . insulin (NOVOLIN-R) infusion Stopped (02/20/17 1803)   Background:  46 yo F. PMH DM, HTN, HLD, asthma. Outpt meds (possibly)  included losartan, naproxyn, farxiga. BL creatinine 1.0 (02/18/17). Presented w/acute asthma, intubated 5/30 after failed conservative mgmt. Cx + rhinovirus. Developed oligoanuric AKI, metabolic acidosis, hyperkalemia. (hypotension, no IV contrast, single dose ibuprofen inpt; pre adm meds as above). CRRT initiated 5/31->6/3.  Assessment/ Plan:    1. AKI: Normal creatinine at baseline (1.0)/UA neg for protein or blood 07/2016. Still suspect current situation  hemodynamically mediated. CRRT 5/31->6/3 c/correction hyperkalemia and anion gap acidosis. Since D/C of CRRT on 6/3 UOP has picked up and creatinine appears to be settling out around 1.4 with stable electrolytes and acid base.   2. Acute asthma exacerbation/VDRF - CCM managing. Hopeful for extubation next 24 hours. 3. Anemia: mild/Hb stable around 10. Likely acute illness. No Fe studies available. 4. Leukocytosis - persistent. Felt steroid related.  5. DM - currently glargine + SSI 6. HTN - try to avoid resumption of losartan until clearly back to baseline creatinine and stable there.  Disposition: Anticipate further renal recovery in absence of further renal insults. Recommend continue trending labs. OK to remove HD catheter unless needed for IV access. For HTN control would not restart losartan just yet - once  see stable GFR (later in the admit or as outpt) could then resume the losartan. Renal will sign off - please call if we can provide additional assistance.  Jamal Maes, MD [facility] [phone] Pager 02/24/2017, 9:41 AM

## 2017-02-24 NOTE — Progress Notes (Signed)
  PT Cancellation Note  Patient Details Name: [name] MRN: [id number] DOB: [birth year]   Cancelled Treatment:    Reason Eval/Treat Not Completed: Medical issues which prohibited therapy (order received, pt not yet 4 hours post extubation)   [name] B [name] 02/24/2017, 11:26 AM  [name], [place]

## 2017-02-24 NOTE — Procedures (Signed)
Extubation Procedure Note  Patient Details:   Name: [name] DOB: [birth year] MRN: [id number]   Airway Documentation:     Evaluation  O2 sats: stable throughout Complications: No apparent complications Patient did tolerate procedure well. Bilateral Breath Sounds: Rhonchi   Yes   Positive cuff leak.  Pt placed on [place] 4 Lpm with humidity. No stridor noted.  Pt is coughing up clear and pink tinged secretions.    Mingo Amber Edmund Rick 02/24/2017, 10:34 AM

## 2017-02-24 NOTE — Progress Notes (Signed)
[facility] Progress Note Patient Name: Kaitlin Alcindor [name] DOB: [birth year] MRN: [id number]   Date of Service  02/24/2017  HPI/Events of Note  Htn, asthma, avoid bb Add hydral  eICU Interventions       Intervention Category Intermediate Interventions: Hypertension - evaluation and management  FEINSTEIN,DANIEL J. 02/24/2017, 3:04 AM

## 2017-02-24 NOTE — Progress Notes (Signed)
PULMONARY / CRITICAL CARE MEDICINE   Name: [name] MRN: [id number] DOB: 1971/08/24    ADMISSION DATE:  02/18/2017 CONSULTATION DATE:  02/18/17  REFERRING MD:  Gwendlyn Deutscher  CHIEF COMPLAINT:  SOB  HISTORY OF PRESENT ILLNESS:  [name] is a 46 y.o. female  With asthma admitted with an asthma exacerbation requiring intubation.  She developed AKI afterwards.    SUBJECTIVE:   Hypertensive overnight Passing SBT Had some agitation on WUA this morning Spiking temp, WBC up Moraxella in resp culture   VITAL SIGNS: BP (!) 195/98   Pulse (!) 122   Temp 99.6 F (37.6 C) (Oral)   Resp (!) 25   Ht 5\' 3"  (1.6 m)   Wt 109 kg (240 lb 4.8 oz)   SpO2 99%   BMI 42.57 kg/m   HEMODYNAMICS:   VENTILATOR SETTINGS: Vent Mode: CPAP;PSV FiO2 (%):  [40 %] 40 % Set Rate:  [18 bmp] 18 bmp Vt Set:  [420 mL] 420 mL PEEP:  [5 cmH20-10 cmH20] 5 cmH20 Pressure Support:  [5 cmH20] 5 cmH20 Plateau Pressure:  [11 cmH20-24 cmH20] 14 cmH20  INTAKE / OUTPUT:  Intake/Output Summary (Last 24 hours) at 02/24/17 0937 Last data filed at 02/24/17 0919  Gross per 24 hour  Intake          2482.29 ml  Output             2130 ml  Net           352.29 ml    General:  Resting comfortably on vent HEENT: NCAT ETT in place PULM: CTA B, vent supported breathing CV: RRR, no mgr GI: BS+, soft, nontender MSK: normal bulk and tone Neuro: awake, follows commands, nods head to questions    LABS:  BMET  Recent Labs Lab 02/22/17 1800 02/23/17 0322 02/24/17 0324  NA 135 136 141  K 4.1 3.9 3.7  CL 101 103 104  CO2 26 27 27   BUN 33* 49* 54*  CREATININE 1.08* 1.38* 1.41*  GLUCOSE 230* 196* 126*    Electrolytes  Recent Labs Lab 02/21/17 0408  02/22/17 0356 02/22/17 1800 02/23/17 0322 02/24/17 0324  CALCIUM  --   < > 8.1* 8.1* 8.2* 8.6*  MG 2.2  --  2.4  --  2.5*  --   PHOS  --   < > 1.9* 3.4 2.7 3.7  < > = values in this interval not  displayed.  CBC  Recent Labs Lab 02/22/17 0356 02/23/17 0322 02/24/17 0324  WBC 18.5* 16.8* 18.7*  HGB 10.5* 10.1* 10.8*  HCT 33.4* 31.2* 33.8*  PLT 235 220 251    Coag's No results for input(s): APTT, INR in the last 168 hours.  Sepsis Markers  Recent Labs Lab 02/19/17 1536 02/19/17 1540 02/20/17 0335 02/21/17 0408  LATICACIDVEN 2.8* 2.3*  --   --   PROCALCITON 0.24  --  0.14 0.13    ABG  Recent Labs Lab 02/19/17 1207 02/20/17 0730 02/22/17 0410  PHART 7.139* 7.371 7.360  PCO2ART 60.2* 62.0* 49.1*  PO2ART 116.0* 91.3 88.4    Liver Enzymes  Recent Labs Lab 02/22/17 1800 02/23/17 0322 02/24/17 0324  ALBUMIN 2.6* 2.5* 2.5*    Cardiac Enzymes  Recent Labs Lab 02/20/17 1047 02/20/17 1600 02/20/17 2247  TROPONINI 0.75* 0.61* 0.44*    Glucose  Recent Labs Lab 02/23/17 1102 02/23/17 1501 02/23/17 1924 02/23/17 2311 02/24/17 0304 02/24/17 0720  GLUCAP 243* 197* 129* 148* 138* 148*  Imaging Dg Chest Port 1 View  Result Date: 02/24/2017 CLINICAL DATA:  Acute on chronic respiratory failure EXAM: PORTABLE CHEST 1 VIEW COMPARISON:  Beneath portable chest x-ray of February 23, 2017 FINDINGS: The lungs arm adequately inflated. Bibasilar interstitial densities are present and more conspicuous today with partial obscuration of the left hemidiaphragm. The cardiac silhouette is enlarged. The pulmonary vascularity is not engorged. The endotracheal tube tip lies approximately 4.5 cm above the carina. The esophagogastric tube tip in proximal port project below the inferior margin of the image. The right internal jugular catheter tip projects over the junction of the proximal and midportions of the SVC. IMPRESSION: Slight interval increase in bibasilar subsegmental atelectasis. There may be small left pleural effusion. The support tubes are in reasonable position. Electronically Signed   By: David  Martinique M.D.   On: 02/24/2017 07:09    STUDIES:  TTE 6/1 > LVEF  65% mild LVH, RV normal  CULTURES: 5/31 Sputum  >  5/31 RVP  > + rhinovirus/enterovirus 5/31 BCx 2 >   6/3 blood culture >  6/3 resp culture >   ANTIBIOTICS: Rocephin 5/31x1 Azithromycin 500mg  IV daily >   SIGNIFICANT EVENTS: 5/30 > admit for acute asthma exacerbation, intubated 5/31 > severe vent dyssynchrony, paralyzed, CRRT 6/1 > remained on CRRT and NMB 6/2 > nimbex off, changed to pressure control 6/3 > held CRRT 6/4 > passing SBT  LINES/TUBES: ETT 5/30 >  OGT 5/30 > Foley 5/30 > R IJ HD 5/31 > 6/4 PIV   DISCUSSION: 46 y.o. female admitted 5/30 with asthma exacerbation that failed conservative management and required intubation.  Rhinovirus positive.  Also with AKI presumably from poor forward flow/hypotension in setting of severe air trapping. As of 6/4 her air movement and air trapping and kidney function are better.  ASSESSMENT / PLAN:  PULMONARY A: Asthma exacerbation -severe: improved greatly Acute respiratory failure with hypoxemia Tobacco dependence  P:   Continue brovana/pulmicort Decrease solumedrol to 40mg  IV q24h Pressure support as tolerated, consider extubation if strong/awake enough VAP prevention  CARDIOVASCULAR A:  Demand ischemia Hx HTN, HLD P:  Tele Restart losartan and amlodipine Prn hydralazine  RENAL A:   AKI -required CVVHD > now making urine  P:   CVVHD on hold Monitor BMET and UOP Replace electrolytes as needed  GASTROINTESTINAL A:   GERD Constipation P:   Continue tube feeding Bowel regimen Pantoprazole for stress ulcer prophylaxis  HEMATOLOGIC A:   Anemia without bleeding P:  Monitor CBC Continue heparin for DVT prophylaxis  INFECTIOUS A:   Leukocytosis due to steroids HCAP? Moraxella in sputum, has infiltrate vs atelectasis >  + rhinovirus 5/31 P:   Monitor cultures Start azithromycin for atypical pneumonia  ENDOCRINE A:  Hyperglycemia  > up DM2 P:   SSI  Continue glargine to 65  units  NEUROLOGIC A:   Acute encephalopathy - due to sedation > better Hx depression, chronic back pain, migraines P:   PAD protocol  RASS goal 0  Wean off sedation   Family updated: Husband updated at bedside 6/5  My cc time 30 minutes  Roselie Awkward, MD [facility] Pager: [phone] Cell: [phone] After 3pm or if no response, call [phone]

## 2017-02-24 NOTE — Progress Notes (Signed)
[name] is a 46 yo F that is being cared for in the ICU after severe asthma exacerbation requiring intubation. Patient has improved in the past 24 hrs and primary team is considering extubation. Appreciate excellent care being provided by the CCM team. Family Medicine Teaching Service to assume care once patient appropriate for transfer to our service.   Marjie Skiff, MD Family Medicine

## 2017-02-24 NOTE — Progress Notes (Signed)
Wasted 250 cc of fentanyl with Deretha Emory. Modena Morrow E, RN 02/24/2017 3:55 PM

## 2017-02-25 ENCOUNTER — Inpatient Hospital Stay (HOSPITAL_COMMUNITY): Payer: Medicare Other

## 2017-02-25 DIAGNOSIS — R51 Headache: Secondary | ICD-10-CM

## 2017-02-25 DIAGNOSIS — G894 Chronic pain syndrome: Secondary | ICD-10-CM

## 2017-02-25 DIAGNOSIS — D72829 Elevated white blood cell count, unspecified: Secondary | ICD-10-CM

## 2017-02-25 DIAGNOSIS — G6281 Critical illness polyneuropathy: Secondary | ICD-10-CM

## 2017-02-25 DIAGNOSIS — E1142 Type 2 diabetes mellitus with diabetic polyneuropathy: Secondary | ICD-10-CM

## 2017-02-25 LAB — RENAL FUNCTION PANEL
ALBUMIN: 2.5 g/dL — AB (ref 3.5–5.0)
Anion gap: 12 (ref 5–15)
BUN: 45 mg/dL — AB (ref 6–20)
CALCIUM: 8.8 mg/dL — AB (ref 8.9–10.3)
CO2: 20 mmol/L — ABNORMAL LOW (ref 22–32)
CREATININE: 1.25 mg/dL — AB (ref 0.44–1.00)
Chloride: 104 mmol/L (ref 101–111)
GFR, EST AFRICAN AMERICAN: 58 mL/min — AB (ref >60)
GFR, EST NON AFRICAN AMERICAN: 50 mL/min — AB (ref >60)
Glucose, Bld: 174 mg/dL — ABNORMAL HIGH (ref 65–99)
PHOSPHORUS: 5.1 mg/dL — AB (ref 2.5–4.6)
Potassium: 4.7 mmol/L (ref 3.5–5.1)
SODIUM: 136 mmol/L (ref 135–145)

## 2017-02-25 LAB — GLUCOSE, CAPILLARY
GLUCOSE-CAPILLARY: 187 mg/dL — AB (ref 65–99)
GLUCOSE-CAPILLARY: 190 mg/dL — AB (ref 65–99)
GLUCOSE-CAPILLARY: 94 mg/dL (ref 65–99)
Glucose-Capillary: 178 mg/dL — ABNORMAL HIGH (ref 65–99)
Glucose-Capillary: 251 mg/dL — ABNORMAL HIGH (ref 65–99)
Glucose-Capillary: 178 mg/dL — ABNORMAL HIGH (ref 65–99)

## 2017-02-25 LAB — CBC WITH DIFFERENTIAL/PLATELET
Basophils Absolute: 0 10*3/uL (ref 0.0–0.1)
Basophils Relative: 0 %
EOS ABS: 0 10*3/uL (ref 0.0–0.7)
EOS PCT: 0 %
HCT: 34.3 % — ABNORMAL LOW (ref 36.0–46.0)
HEMOGLOBIN: 11.4 g/dL — AB (ref 12.0–15.0)
LYMPHS ABS: 4.4 10*3/uL — AB (ref 0.7–4.0)
Lymphocytes Relative: 22 %
MCH: 29.3 pg (ref 26.0–34.0)
MCHC: 33.2 g/dL (ref 30.0–36.0)
MCV: 88.2 fL (ref 78.0–100.0)
MONO ABS: 1.8 10*3/uL — AB (ref 0.1–1.0)
Monocytes Relative: 9 %
NEUTROS PCT: 69 %
Neutro Abs: 13.6 10*3/uL — ABNORMAL HIGH (ref 1.7–7.7)
PLATELETS: 272 10*3/uL (ref 150–400)
RBC: 3.89 MIL/uL (ref 3.87–5.11)
RDW: 15.6 % — ABNORMAL HIGH (ref 11.5–15.5)
WBC: 19.8 10*3/uL — AB (ref 4.0–10.5)

## 2017-02-25 LAB — CULTURE, RESPIRATORY

## 2017-02-25 LAB — CULTURE, RESPIRATORY W GRAM STAIN

## 2017-02-25 MED ORDER — SIMVASTATIN 20 MG PO TABS
10.0000 mg | ORAL_TABLET | Freq: Every day | ORAL | Status: DC
  Administered 2017-02-25 – 2017-02-26 (×2): 10 mg via ORAL
  Filled 2017-02-25 (×2): qty 1

## 2017-02-25 MED ORDER — ONDANSETRON HCL 4 MG/5ML PO SOLN
4.0000 mg | Freq: Once | ORAL | Status: AC
  Administered 2017-02-25: 4 mg via ORAL
  Filled 2017-02-25: qty 5

## 2017-02-25 MED ORDER — GLUCERNA SHAKE PO LIQD
237.0000 mL | Freq: Three times a day (TID) | ORAL | Status: DC
  Administered 2017-02-26: 237 mL via ORAL

## 2017-02-25 MED ORDER — ORAL CARE MOUTH RINSE
15.0000 mL | Freq: Two times a day (BID) | OROMUCOSAL | Status: DC
  Administered 2017-02-25 – 2017-02-27 (×3): 15 mL via OROMUCOSAL

## 2017-02-25 MED ORDER — PANTOPRAZOLE SODIUM 40 MG PO TBEC
40.0000 mg | DELAYED_RELEASE_TABLET | Freq: Every day | ORAL | Status: DC
  Administered 2017-02-25 – 2017-02-26 (×2): 40 mg via ORAL
  Filled 2017-02-25 (×2): qty 1

## 2017-02-25 MED ORDER — ENOXAPARIN SODIUM 40 MG/0.4ML ~~LOC~~ SOLN
40.0000 mg | SUBCUTANEOUS | Status: DC
  Administered 2017-02-26 – 2017-02-27 (×2): 40 mg via SUBCUTANEOUS
  Filled 2017-02-25 (×2): qty 0.4

## 2017-02-25 MED ORDER — AMLODIPINE BESYLATE 10 MG PO TABS
10.0000 mg | ORAL_TABLET | Freq: Every day | ORAL | Status: DC
  Administered 2017-02-26 – 2017-02-27 (×2): 10 mg via ORAL
  Filled 2017-02-25: qty 1
  Filled 2017-02-25: qty 2

## 2017-02-25 MED ORDER — SERTRALINE HCL 50 MG PO TABS
50.0000 mg | ORAL_TABLET | Freq: Every day | ORAL | Status: DC
Start: 2017-02-26 — End: 2017-02-27
  Administered 2017-02-26 – 2017-02-27 (×2): 50 mg via ORAL
  Filled 2017-02-25 (×2): qty 1

## 2017-02-25 NOTE — Progress Notes (Signed)
Nutrition Follow-up  DOCUMENTATION CODES:   Morbid obesity  INTERVENTION:   -Glucerna Shake po TID, each supplement provides 220 kcal and 10 grams of protein  NUTRITION DIAGNOSIS:   Inadequate oral intake related to poor appetite as evidenced by meal completion < 50%.  GOAL:   Patient will meet greater than or equal to 90% of their needs  MONITOR:   PO intake, Supplement acceptance, Labs, Weight trends, Skin, I & O's  REASON FOR ASSESSMENT:   Ventilator, Rounds (RD to order TF)    ASSESSMENT:   46 yo female with PMH of asthma, HTN, DM, GERD, hypokalemia, smoker, who was admitted on 5/30 with respiratory distress, increased WOB and required intubation.  5/31- CRRT started 6/3- CRRT d/c 6/5- extubated  Spoke with pt at bedside, who reports she is swallowing well and able to tolerate current diet texture without difficulty. Meal completion 25-50%. Pt consumed less than 50% of breakfast tray this MA- 25% of bagel and jelly, all of fruit, and 75% of oatmeal.   Pt preparing to work with PT at time of visit. Discussed importance of good meal intake to promote healing and therapy progression. RD will order Glucerna shake to increase intake.  Labs reviewed: CBGS: 94-187, Phos: 5.1.  Diet Order:  Diet Carb Modified Fluid consistency: Thin; Room service appropriate? Yes  Skin:  Reviewed, no issues  Last BM:  02/25/17  Height:   Ht Readings from Last 1 Encounters:  02/23/17 5\' 3"  (1.6 m)    Weight:   Wt Readings from Last 1 Encounters:  02/25/17 237 lb 10.5 oz (107.8 kg)    Ideal Body Weight:  52.3 kg  BMI:  Body mass index is 42.1 kg/m.  Estimated Nutritional Needs:   Kcal:  1600-1800  Protein:  105-120 grams  Fluid:  1.6-1.8 L  EDUCATION NEEDS:   Education needs addressed  Sadiya Durand A. Jimmye Norman, RD, LDN, CDE Pager: [phone] After hours Pager: [phone]

## 2017-02-25 NOTE — Progress Notes (Signed)
PULMONARY / CRITICAL CARE MEDICINE   Name: [name] MRN: [id number] DOB: [birth year]    ADMISSION DATE:  02/18/2017 CONSULTATION DATE:  02/18/17  REFERRING MD:  Gwendlyn Deutscher  CHIEF COMPLAINT:  SOB  HISTORY OF PRESENT ILLNESS:  [name] is a 46 y.o. female  With asthma admitted with an asthma exacerbation requiring intubation.  She developed AKI afterwards.    SUBJECTIVE:   Extubated 6/5- now on room air at 94% Per RN, O2 sat dropped while sitting on the side of the bed   VITAL SIGNS: BP (!) 143/75   Pulse 78   Temp 99.2 F (37.3 C) (Oral)   Resp (!) 21   Ht 5\' 3"  (1.6 m)   Wt 237 lb 10.5 oz (107.8 kg)   SpO2 95%   BMI 42.10 kg/m   HEMODYNAMICS:   VENTILATOR SETTINGS:    INTAKE / OUTPUT:  Intake/Output Summary (Last 24 hours) at 02/25/17 1311 Last data filed at 02/25/17 1200  Gross per 24 hour  Intake              510 ml  Output             2325 ml  Net            -1815 ml   General:  Adult female sitting upright in bed in NAD HEENT: MM pink/moist, voice soft/ hoarse Neuro: Alert, oriented, MAE, generalized weak CV: s1s2 rrr, no m/r/g PULM: even/non-labored, lungs bilaterally coarse w/expiratory wheezing GI: obese, soft, non-tender, bsx4 active  Extremities: warm/dry, no edema  Skin: no rashes or lesions   LABS:  BMET  Recent Labs Lab 02/23/17 0322 02/24/17 0324 02/25/17 0235  NA 136 141 136  K 3.9 3.7 4.7  CL 103 104 104  CO2 27 27 20*  BUN 49* 54* 45*  CREATININE 1.38* 1.41* 1.25*  GLUCOSE 196* 126* 174*    Electrolytes  Recent Labs Lab 02/21/17 0408  02/22/17 0356  02/23/17 0322 02/24/17 0324 02/25/17 0235  CALCIUM  --   < > 8.1*  < > 8.2* 8.6* 8.8*  MG 2.2  --  2.4  --  2.5*  --   --   PHOS  --   < > 1.9*  < > 2.7 3.7 5.1*  < > = values in this interval not displayed.  CBC  Recent Labs Lab 02/23/17 0322 02/24/17 0324 02/25/17 0235  WBC 16.8* 18.7* 19.8*  HGB 10.1* 10.8* 11.4*  HCT 31.2*  33.8* 34.3*  PLT 220 251 272    Coag's No results for input(s): APTT, INR in the last 168 hours.  Sepsis Markers  Recent Labs Lab 02/19/17 1536 02/19/17 1540 02/20/17 0335 02/21/17 0408  LATICACIDVEN 2.8* 2.3*  --   --   PROCALCITON 0.24  --  0.14 0.13    ABG  Recent Labs Lab 02/19/17 1207 02/20/17 0730 02/22/17 0410  PHART 7.139* 7.371 7.360  PCO2ART 60.2* 62.0* 49.1*  PO2ART 116.0* 91.3 88.4    Liver Enzymes  Recent Labs Lab 02/23/17 0322 02/24/17 0324 02/25/17 0235  ALBUMIN 2.5* 2.5* 2.5*    Cardiac Enzymes  Recent Labs Lab 02/20/17 1047 02/20/17 1600 02/20/17 2247  TROPONINI 0.75* 0.61* 0.44*    Glucose  Recent Labs Lab 02/24/17 1109 02/24/17 1506 02/24/17 1919 02/25/17 0055 02/25/17 0340 02/25/17 0715  GLUCAP 246* 94 112* 187* 178* 94    Imaging Dg Chest Port 1 View  Result Date: 02/25/2017 CLINICAL DATA:  Respiratory failure. EXAM: PORTABLE  CHEST 1 VIEW COMPARISON:  02/24/2017. FINDINGS: Interim removal of endotracheal tube and NG tube. Interim removal of right IJ line. Mediastinum and hilar structures are normal. Cardiomegaly with normal pulmonary vascularity. Low lung volumes. Improved bibasilar atelectasis . Small left pleural effusion. No pneumothorax. IMPRESSION: 1. Interim extubation and removal of NG tube. Interim removal of right IJ line . 2. Stable cardiomegaly. 3. Lung volumes. Improved bibasilar atelectasis. Small left pleural effusion again noted. Electronically Signed   By: Marcello Moores  Register   On: 02/25/2017 06:23    STUDIES:  TTE 6/1 > LVEF 65% mild LVH, RV normal SLP 6/6 > mild aspiration risk, recs for regular thin liquid  CULTURES: 5/31 Sputum  >   5/31 RVP  > + rhinovirus/enterovirus 5/31 BCx 2 > negative  6/3 blood culture >  6/3 resp culture > moraxella  ANTIBIOTICS: Rocephin 5/31x1 Azithromycin 500mg  IV daily >   SIGNIFICANT EVENTS: 5/30 > admit for acute asthma exacerbation, intubated 5/31 > severe vent  dyssynchrony, paralyzed, CRRT 6/1 > remained on CRRT and NMB 6/2 > nimbex off, changed to pressure control 6/3 > held CRRT 6/4 > passing SBT  LINES/TUBES: ETT 5/30 > 6/5 OGT 5/30 > 6/5 Foley 5/30 > R IJ HD 5/31 > 6/4 PIV   DISCUSSION: 46 y.o. female admitted 5/30 with asthma exacerbation that failed conservative management and required intubation.  Rhinovirus positive.  Also with AKI presumably from poor forward flow/hypotension in setting of severe air trapping. As of 6/4 her air movement and air trapping and kidney function are better.  ASSESSMENT / PLAN:  PULMONARY A: Asthma exacerbation -severe: improved greatly Acute respiratory failure with hypoxemia Tobacco dependence ? HCAP- w/ Moraxella in sputum, infiltrate vs atelectasis  P:   O2 as needed for sats > 92% Continue brovana/pulmicort Continue solumedrol to 40mg  IV q24h Aggressive pulm hygiene with IS and mobilization w/PT SLP eval 6/6 - mild aspiration risk Abx as below Tobacco cessation counseling   CARDIOVASCULAR A:  Demand ischemia Hx HTN, HLD P:  Tele Continue losartan (cautiously given renal status) Increase amlodipine to 10mg  daily Prn hydralazine  RENAL A:   AKI -required CVVHD/ stopped 6/4 > now making urine and sCr improving  P:   Appreciate renal input Trend BMP / urinary output Replace electrolytes as indicated Avoid nephrotoxic agents, ensure adequate renal perfusion   GASTROINTESTINAL A:   GERD Constipation P:   S/p SLP eval- w/ recs for carb modified/ thin liquid Bowel regimen Change Pantoprazole to PO for SUP  HEMATOLOGIC A:   Anemia without bleeding P:  Monitor CBC Increase Lovenox to 40 mg SQ   INFECTIOUS A:   Leukocytosis due to steroids HCAP? Moraxella in sputum, has infiltrate vs atelectasis >  + rhinovirus 5/31 P:   Monitor cultures Continue azithromycin for atypical pneumonia Monitor fever curve/ trend WBC  ENDOCRINE A:  Hyperglycemia  > up DM2 P:   SSI    NEUROLOGIC A:   Acute encephalopathy - due to sedation > better Hx depression, chronic back pain, migraines P:   Continue topamax, zoloft PT following   Family updated: Husband updated at bedside 6/5  Will transfer to SDU with Family Medicine to pick up 6/7.  PCCM will sign off.    [name], AGACNP-BC [facility] Pgr: [phone] or if no answer [phone] 02/25/2017, 1:48 PM  Attending Note:  I have examined patient, reviewed labs, studies and notes. I have discussed the case with B Simpson, and I agree with the  data and plans as amended above. 46 yo obese woman, smoker with asthma, admitted with an acute exacerbation in setting rhinovirus and moraxella bronchitis vs HCAP. She was extubated 6/5 successfully. On my eval she is comfortable on RA, has a hoarse voice. No wheeze on lung exam. She has started a diet. contineu azithro. Increase norvasc today. She can transfer to [facility]. We will ask FPTS to resume her care 6/7.   Baltazar Apo, MD, PhD 02/25/2017, 4:09 PM [facility] [phone] or if no answer [phone]

## 2017-02-25 NOTE — Evaluation (Signed)
Physical Therapy Evaluation Patient Details Name: [name] MRN: [id number] DOB: 12/26/1970 Today's Date: 02/25/2017   History of Present Illness  Pt adm with severe asthma exacerbation requiring intubation 5/31-6/5. Developed AKI and on CRRT 5/31-6/4. PMH - ashtma, DM, Obesity, chronic back pain  Clinical Impression  Pt admitted with above diagnosis and presents to PT with functional limitations due to deficits listed below (See PT problem list). Pt needs skilled PT to maximize independence and safety to allow discharge to CIR prior to return home. Pt with profound weakness after time on vent and CRRT. Pt motivated and expect progress will be steady. Feel she can benefit from CIR.     Follow Up Recommendations CIR    Equipment Recommendations  Other (comment) (To be assessed)    Recommendations for Other Services OT consult     Precautions / Restrictions Precautions Precautions: Fall Precaution Comments: watch SpO2 Restrictions Weight Bearing Restrictions: No      Mobility  Bed Mobility Overal bed mobility: Needs Assistance Bed Mobility: Supine to Sit;Sit to Supine     Supine to sit: Total assist Sit to supine: +2 for physical assistance;Max assist   General bed mobility comments: Assist for all aspects  Transfers                    Ambulation/Gait                Stairs            Wheelchair Mobility    Modified Rankin (Stroke Patients Only)       Balance Overall balance assessment: Needs assistance Sitting-balance support: Bilateral upper extremity supported;Feet supported Sitting balance-[name]: Poor Sitting balance - Comments: Pt sat EOB x 15 minutes with mod to min guard assist. Pt initially light headed with sitting but BP without drop and 160's/80. SpO2 90% as fatigued on RA. Primarily needed assist due to weakness and fatigue more than true balance problem. Postural control: Right lateral lean;Left lateral  lean                                   Pertinent Vitals/Pain Pain Assessment: Faces Faces Pain Scale: Hurts little more Pain Location: back Pain Descriptors / Indicators: Grimacing Pain Intervention(s): Limited activity within patient's tolerance;Repositioned    Home Living Family/patient expects to be discharged to:: Private residence Living Arrangements: Spouse/significant other;Children Available Help at Discharge: Family Type of Home: House Home Access: Stairs to enter Entrance Stairs-Rails: Right Entrance Stairs-Number of Steps: several Home Layout: One level Home Equipment: None      Prior Function Level of Independence: Independent               Hand Dominance   Dominant Hand: Right    Extremity/Trunk Assessment   Upper Extremity Assessment Upper Extremity Assessment: Defer to OT evaluation    Lower Extremity Assessment Lower Extremity Assessment: RLE deficits/detail;LLE deficits/detail RLE Deficits / Details: grossly 2-2+/5 LLE Deficits / Details: grossly 2-2+/5       Communication   Communication: Other (comment) (very soft, hoarse)  Cognition Arousal/Alertness: Awake/alert Behavior During Therapy: Flat affect Overall Cognitive Status: Impaired/Different from baseline Area of Impairment: Problem solving                             Problem Solving: Slow processing;Decreased initiation        General  Comments      Exercises     Assessment/Plan    PT Assessment Patient needs continued PT services  PT Problem List Decreased strength;Decreased activity tolerance;Decreased balance;Decreased mobility;Decreased knowledge of use of DME;Cardiopulmonary status limiting activity;Obesity       PT Treatment Interventions DME instruction;Gait training;Functional mobility training;Therapeutic activities;Stair training;Therapeutic exercise;Balance training;Patient/family education    PT Goals (Current goals can be found in  the Care Plan section)  Acute Rehab PT Goals Patient Stated Goal: Return home PT Goal Formulation: With patient Time For Goal Achievement: 03/11/17 Potential to Achieve Goals: Good    Frequency Min 3X/week   Barriers to discharge Inaccessible home environment stairs to enter    Co-evaluation               AM-PAC PT "6 Clicks" Daily Activity  Outcome Measure Difficulty turning over in bed (including adjusting bedclothes, sheets and blankets)?: Total Difficulty moving from lying on back to sitting on the side of the bed? : Total Difficulty sitting down on and standing up from a chair with arms (e.g., wheelchair, bedside commode, etc,.)?: Total Help needed moving to and from a bed to chair (including a wheelchair)?: Total Help needed walking in hospital room?: Total Help needed climbing 3-5 steps with a railing? : Total 6 Click Score: 6    End of Session   Activity Tolerance: Patient limited by fatigue Patient left: in bed;with call bell/phone within reach (in chair position) Nurse Communication: Mobility status;Need for lift equipment PT Visit Diagnosis: Muscle weakness (generalized) (M62.81);Difficulty in walking, not elsewhere classified (R26.2)    Time: 5945-8592 PT Time Calculation (min) (ACUTE ONLY): 28 min   Charges:   PT Evaluation $PT Eval Moderate Complexity: 1 Procedure PT Treatments $Therapeutic Activity: 8-22 mins   PT G Codes:        [facility] PT [place] 02/25/2017, 1:31 PM

## 2017-02-25 NOTE — Progress Notes (Signed)
Rehab Admissions Coordinator Note:  Patient was screened by Retta Diones for appropriateness for an Inpatient Acute Rehab Consult.  At this time, we are recommending Inpatient Rehab consult.  Retta Diones 02/25/2017, 2:58 PM  I can be reached at [phone].

## 2017-02-25 NOTE — Consult Note (Signed)
Physical Medicine and Rehabilitation Consult  Reason for Consult: Critical illness polyneuropathy.  Referring Physician: Dr. Lamonte Sakai  HPI: [name] is a 46 y.o. female with history of asthma, T2DM, chronic pain, HA; who was admitted on 02/18/17 with asthma exacerbation requiring intubation. History taken from chart review and multiple family members. She was treated with steroids and antibiotics with improvement and tolerated extubation on 6/5.  She developed AKI requiring brief CRRT/HD thorough 6/4 and is showing improvement in SCr and UOP.  She was started on regular diet today and PT evaluation done revealing significant weakness BUE/BLE. CIR recommended for follow up therapy.   Review of Systems  Constitutional: Positive for malaise/fatigue.  HENT: Negative for hearing loss and tinnitus.   Eyes: Negative for blurred vision and double vision.  Respiratory: Positive for cough and shortness of breath.   Cardiovascular: Positive for leg swelling. Negative for chest pain and palpitations.  Gastrointestinal: Positive for heartburn and nausea. Negative for abdominal pain.  Musculoskeletal: Positive for back pain (chronic back pain radiating to RLE) and myalgias.  Skin: Negative for itching and rash.  Neurological: Positive for dizziness, sensory change (numbness/tingling RUE/RLE), speech change, focal weakness, weakness (Had to take rest breaks to complete home tasks. ) and headaches (chronic/daily).  Psychiatric/Behavioral: Negative for memory loss.  All other systems reviewed and are negative.     Past Medical History:  Diagnosis Date  . Arthritis   . Asthma   . Chronic back pain   . Chronic chest pain   . Depression   . DM (diabetes mellitus) ([place])    INSULIN DEPENDENT  . GERD (gastroesophageal reflux disease)   . Headache(784.0)   . Hypertension   . Hypokalemia     Past Surgical History:  Procedure Laterality Date  . APPENDECTOMY    . CESAREAN  SECTION     x 3  . CHOLECYSTECTOMY N/A 03/02/2014   Procedure: LAPAROSCOPIC CHOLECYSTECTOMY;  Surgeon: Joyice Faster. Cornett, MD;  Location: [facility];  Service: General;  Laterality: N/A;  . HERNIA REPAIR    . ROTATOR CUFF REPAIR    . VESICOVAGINAL FISTULA CLOSURE W/ TAH  2009    Family History  Problem Relation Age of Onset  . Heart attack Mother   . Stroke Mother   . Diabetes Mother   . Hypertension Mother   . Arthritis Mother   . Stroke Father   . Hypertension Sister   . Diabetes Sister   . Seizures Brother   . Diabetes Brother     Social History:  [name] with husband and grown children. Disabled due to multiple medical issues. Sedentary and limited by SOB but manages home/meal prep. She reports that she has been smoking Cigarettes one pack every 3 days.  She has a 5.50 pack-year smoking history. She has never used smokeless tobacco. She reports that she drinks about 0.6 - 1.2 oz of alcohol per week . She reports that she does not use drugs.     Allergies  Allergen Reactions  . Reglan [Metoclopramide] Other (See Comments)    Panic attack    Medications Prior to Admission  Medication Sig Dispense Refill  . albuterol (PROVENTIL HFA;VENTOLIN HFA) 108 (90 Base) MCG/ACT inhaler Inhale 2 puffs into the lungs every 6 (six) hours as needed for wheezing or shortness of breath. Reported on 02/22/2016 1 each 3  . albuterol (PROVENTIL) (2.5 MG/3ML) 0.083% nebulizer solution Take 3 mLs (2.5 mg total) by nebulization every 6 (six) hours as  needed for wheezing or shortness of breath. Reported on 02/22/2016 75 mL 3  . amLODipine (NORVASC) 5 MG tablet Take 1 tablet (5 mg total) by mouth daily. 90 tablet 1  . aspirin EC 325 MG tablet Take 325 mg by mouth daily.    . bromocriptine (PARLODEL) 2.5 MG tablet 1/4 tab daily (Patient taking differently: Take 0.625 mg by mouth daily. ) 20 tablet 2  . dapagliflozin propanediol (FARXIGA) 10 MG TABS tablet Take 10 mg by mouth daily. 30 tablet 11  .  Dulaglutide (TRULICITY) 1.5 ZJ/6.7HA SOPN Inject 1.5 mg into the skin once a week. 4 pen 11  . Fluticasone-Salmeterol (ADVAIR) 250-50 MCG/DOSE AEPB Inhale 1 puff into the lungs 2 times daily at 12 noon and 4 pm. 60 each 3  . gabapentin (NEURONTIN) 300 MG capsule Take 1 capsule (300 mg total) by mouth 3 (three) times daily. 90 capsule 3  . hydrochlorothiazide (HYDRODIURIL) 25 MG tablet Take 1 tablet (25 mg total) by mouth daily. 30 tablet 4  . losartan (COZAAR) 100 MG tablet Take 1 tablet (100 mg total) by mouth daily. For Blood pressure - must have office visit for refills 30 tablet 3  . methocarbamol (ROBAXIN) 500 MG tablet Take 1 tablet (500 mg total) by mouth every 8 (eight) hours as needed for muscle spasms. 90 tablet 1  . naproxen (NAPROSYN) 500 MG tablet Take 1 tablet (500 mg total) by mouth 2 (two) times daily with a meal. 60 tablet 1  . olopatadine (PATANOL) 0.1 % ophthalmic solution Place 1 drop into the right eye 2 (two) times daily. 5 mL 0  . oxyCODONE-acetaminophen (PERCOCET) 5-325 MG tablet Take 1-2 tablets by mouth every 4 (four) hours as needed. (Patient taking differently: Take 1-2 tablets by mouth every 4 (four) hours as needed for moderate pain. ) 20 tablet 0  . pantoprazole (PROTONIX) 40 MG tablet Take 1 tablet (40 mg total) by mouth daily. 30 tablet 3  . prochlorperazine (COMPAZINE) 10 MG tablet Take 1 tablet (10 mg total) by mouth 2 (two) times daily as needed for nausea or vomiting. 10 tablet 0  . repaglinide (PRANDIN) 2 MG tablet 1 tab with breakfast and lunch, and 2 tabs with dinner. (Patient taking differently: Take 2-4 mg by mouth See admin instructions. Take 2 mg by mouth with breakfast and lunch, and 4 mg by mouth with dinner.) 120 tablet 11  . sertraline (ZOLOFT) 50 MG tablet Take 1 tablet (50 mg total) by mouth daily. 30 tablet 3  . simvastatin (ZOCOR) 10 MG tablet Take 1 tablet (10 mg total) by mouth daily at 6 PM. 30 tablet 3  . SUMAtriptan (IMITREX) 100 MG tablet TAKE  1TAB AT EARLIEST ONSET OF HEADACHE. MAY REPEAT X1 IN 2 HOURS IF HEADACHE PERSISTS OR RECURS. DO NOT EXCEED 2 TABS IN 24HRS 9 tablet 2  . topiramate (TOPAMAX) 100 MG tablet Take 1 tablet (100 mg total) by mouth 2 (two) times daily. 60 tablet 3  . acetaminophen-codeine (TYLENOL #3) 300-30 MG tablet Take 1 tablet by mouth every 12 (twelve) hours as needed for moderate pain. 30 tablet 0  . Blood Glucose Monitoring Suppl (ACCU-CHEK AVIVA PLUS) w/Device KIT Use to check blood sugar 3 times per day. DX CODE: E11.9 (Patient not taking: Reported on 02/19/2017) 1 kit 2  . glucose blood (ACCU-CHEK AVIVA PLUS) test strip Use to check blood sugar 3 times per day.DX Code E11.9 (Patient not taking: Reported on 02/19/2017) 100 each 12  . Insulin Pen  Needle 32G X 4 MM MISC Use to inject insulin 3 times per day. (Patient not taking: Reported on 02/19/2017) 100 each 2  . Lancet Devices (ACCU-CHEK SOFTCLIX) lancets Use to check blood sugar 3 times per day dx code E11.9 (Patient not taking: Reported on 02/19/2017) 100 each 2  . Lancets (ACCU-CHEK MULTICLIX) lancets Use to check blood sugar 3 times per day. DX code E11.9 (Patient not taking: Reported on 02/19/2017) 100 each 12    Home: [place] expects to be discharged to:: Private residence Living Arrangements: Spouse/significant other, Children Available Help at Discharge: Family Type of Home: House Home Access: Stairs to enter Technical brewer of Steps: several Entrance Stairs-Rails: Right Home Layout: One level Home Equipment: None  Functional History: Prior Function Level of Independence: Independent Functional Status:  Mobility: Bed Mobility Overal bed mobility: Needs Assistance Bed Mobility: Supine to Sit, Sit to Supine Supine to sit: Total assist Sit to supine: +2 for physical assistance, Max assist General bed mobility comments: Assist for all aspects        ADL:    Cognition: Cognition Overall Cognitive Status:  Impaired/Different from baseline Orientation Level: Oriented X4 Cognition Arousal/Alertness: Awake/alert Behavior During Therapy: Flat affect Overall Cognitive Status: Impaired/Different from baseline Area of Impairment: Problem solving Problem Solving: Slow processing, Decreased initiation   Blood pressure (!) 162/90, pulse 96, temperature 98.7 F (37.1 C), temperature source Oral, resp. rate 15, height 5' 3"  (1.6 m), weight 107.8 kg (237 lb 10.5 oz), SpO2 94 %. Physical Exam  Nursing note and vitals reviewed. Constitutional: She is oriented to person, place, and time. She appears well-developed and well-nourished.  Fatigued appearing obese female. Anasarca noted.   HENT:  Head: Normocephalic and atraumatic.  Mouth/Throat: Oropharynx is clear and moist.  Eyes: Conjunctivae and EOM are normal. Pupils are equal, round, and reactive to light.  Neck: Normal range of motion. Neck supple.  Dry dressing right neck.   Cardiovascular: Normal rate and regular rhythm.   Respiratory: No respiratory distress. She has wheezes. She exhibits no tenderness.  Increase WOB with conversation.   GI: Soft. Bowel sounds are normal.  Musculoskeletal: She exhibits edema and tenderness.  1-2+ edema BUE/BLE  Neurological: She is alert and oriented to person, place, and time.  Encephalopathic appearing.  Dysphonia--speaks in a whisper and able to phonate occasionally.  Minimal movement throughout UE/LE Sensation diminished to light touch DTRs 3+ LLE  Skin: Skin is warm and dry.  Psychiatric: Her affect is blunt and labile. Her speech is delayed. She is slowed.    Results for orders placed or performed during the hospital encounter of 02/18/17 (from the past 24 hour(s))  Glucose, capillary     Status: Abnormal   Collection Time: 02/24/17  7:19 PM  Result Value Ref Range   Glucose-Capillary 112 (H) 65 - 99 mg/dL   Comment 1 Notify RN   Glucose, capillary     Status: Abnormal   Collection Time:  02/25/17 12:55 AM  Result Value Ref Range   Glucose-Capillary 187 (H) 65 - 99 mg/dL   Comment 1 Notify RN   Renal function panel (daily at 0500)     Status: Abnormal   Collection Time: 02/25/17  2:35 AM  Result Value Ref Range   Sodium 136 135 - 145 mmol/L   Potassium 4.7 3.5 - 5.1 mmol/L   Chloride 104 101 - 111 mmol/L   CO2 20 (L) 22 - 32 mmol/L   Glucose, Bld 174 (H) 65 - 99  mg/dL   BUN 45 (H) 6 - 20 mg/dL   Creatinine, Ser 1.25 (H) 0.44 - 1.00 mg/dL   Calcium 8.8 (L) 8.9 - 10.3 mg/dL   Phosphorus 5.1 (H) 2.5 - 4.6 mg/dL   Albumin 2.5 (L) 3.5 - 5.0 g/dL   GFR calc non Af Amer 50 (L) >60 mL/min   GFR calc Af Amer 58 (L) >60 mL/min   Anion gap 12 5 - 15  CBC with Differential/Platelet     Status: Abnormal   Collection Time: 02/25/17  2:35 AM  Result Value Ref Range   WBC 19.8 (H) 4.0 - 10.5 K/uL   RBC 3.89 3.87 - 5.11 MIL/uL   Hemoglobin 11.4 (L) 12.0 - 15.0 g/dL   HCT 34.3 (L) 36.0 - 46.0 %   MCV 88.2 78.0 - 100.0 fL   MCH 29.3 26.0 - 34.0 pg   MCHC 33.2 30.0 - 36.0 g/dL   RDW 15.6 (H) 11.5 - 15.5 %   Platelets 272 150 - 400 K/uL   Neutrophils Relative % 69 %   Lymphocytes Relative 22 %   Monocytes Relative 9 %   Eosinophils Relative 0 %   Basophils Relative 0 %   Neutro Abs 13.6 (H) 1.7 - 7.7 K/uL   Lymphs Abs 4.4 (H) 0.7 - 4.0 K/uL   Monocytes Absolute 1.8 (H) 0.1 - 1.0 K/uL   Eosinophils Absolute 0.0 0.0 - 0.7 K/uL   Basophils Absolute 0.0 0.0 - 0.1 K/uL  Glucose, capillary     Status: Abnormal   Collection Time: 02/25/17  3:40 AM  Result Value Ref Range   Glucose-Capillary 178 (H) 65 - 99 mg/dL   Comment 1 Notify RN   Glucose, capillary     Status: None   Collection Time: 02/25/17  7:15 AM  Result Value Ref Range   Glucose-Capillary 94 65 - 99 mg/dL   Comment 1 Capillary Specimen   Glucose, capillary     Status: Abnormal   Collection Time: 02/25/17 11:36 AM  Result Value Ref Range   Glucose-Capillary 251 (H) 65 - 99 mg/dL   Comment 1 Capillary Specimen     Glucose, capillary     Status: Abnormal   Collection Time: 02/25/17  4:21 PM  Result Value Ref Range   Glucose-Capillary 190 (H) 65 - 99 mg/dL   Comment 1 Capillary Specimen    Comment 2 Notify RN    Dg Chest Port 1 View  Result Date: 02/25/2017 CLINICAL DATA:  Respiratory failure. EXAM: PORTABLE CHEST 1 VIEW COMPARISON:  02/24/2017. FINDINGS: Interim removal of endotracheal tube and NG tube. Interim removal of right IJ line. Mediastinum and hilar structures are normal. Cardiomegaly with normal pulmonary vascularity. Low lung volumes. Improved bibasilar atelectasis . Small left pleural effusion. No pneumothorax. IMPRESSION: 1. Interim extubation and removal of NG tube. Interim removal of right IJ line . 2. Stable cardiomegaly. 3. Lung volumes. Improved bibasilar atelectasis. Small left pleural effusion again noted. Electronically Signed   By: Marcello Moores  Register   On: 02/25/2017 06:23   Dg Chest Port 1 View  Result Date: 02/24/2017 CLINICAL DATA:  Acute on chronic respiratory failure EXAM: PORTABLE CHEST 1 VIEW COMPARISON:  Beneath portable chest x-ray of February 23, 2017 FINDINGS: The lungs arm adequately inflated. Bibasilar interstitial densities are present and more conspicuous today with partial obscuration of the left hemidiaphragm. The cardiac silhouette is enlarged. The pulmonary vascularity is not engorged. The endotracheal tube tip lies approximately 4.5 cm above the carina. The esophagogastric  tube tip in proximal port project below the inferior margin of the image. The right internal jugular catheter tip projects over the junction of the proximal and midportions of the SVC. IMPRESSION: Slight interval increase in bibasilar subsegmental atelectasis. There may be small left pleural effusion. The support tubes are in reasonable position. Electronically Signed   By: David  Martinique M.D.   On: 02/24/2017 07:09    Assessment/Plan: Diagnosis: CIP Labs independently reviewed.  Records reviewed and  summated above.  1. Does the need for close, 24 hr/day medical supervision in concert with the patient's rehab needs make it unreasonable for this patient to be served in a less intensive setting? Yes 2. Co-Morbidities requiring supervision/potential complications: AKI (avoid nephrotoxic meds), asthma (monitor RR and O2 sats with increased activity), T2 DM (Monitor in accordance with exercise and adjust meds as necessary), chronic pain (Biofeedback training with therapies to help reduce reliance on opiate pain medications, particularly IV fentanyl, monitor pain control during therapies, and sedation at rest and titrate to maximum efficacy to ensure participation and gains in therapies), HA (see previous), leukocytosis (cont to monitor for signs and symptoms of infection, further workup if indicated), morbid obesity Body mass index is 42.1 kg/m., Diet and exercise education, encourage weight loss to increase endurance and promote overall health) 3. Due to safety, disease management, medication administration, pain management and patient education, does the patient require 24 hr/day rehab nursing? Yes 4. Does the patient require coordinated care of a physician, rehab nurse, PT (1-2 hrs/day, 5 days/week), OT (1-2 hrs/day, 5 days/week) and SLP (1-2 hrs/day, 5 days/week) to address physical and functional deficits in the context of the above medical diagnosis(es)? Yes Addressing deficits in the following areas: balance, endurance, locomotion, strength, transferring, bowel/bladder control, bathing, dressing, feeding, grooming, toileting, cognition, speech, language, swallowing and psychosocial support 5. Can the patient actively participate in an intensive therapy program of at least 3 hrs of therapy per day at least 5 days per week? Potentially 6. The potential for patient to make measurable gains while on inpatient rehab is excellent 7. Anticipated functional outcomes upon discharge from inpatient rehab are  min assist and mod assist  with PT, min assist and mod assist with OT, supervision with SLP. 8. Estimated rehab length of stay to reach the above functional goals is: 20-25 days. 9. Anticipated D/C setting: Home 10. Anticipated post D/C treatments: HH therapy and Home excercise program 11. Overall Rehab/Functional Prognosis: excellent  RECOMMENDATIONS: This patient's condition is appropriate for continued rehabilitative care in the following setting: CIR when medically stable and able to tolerate 3 hours therapy/day. Patient has agreed to participate in recommended program. Potentially Note that insurance prior authorization may be required for reimbursement for recommended care.  Comment: Rehab Admissions Coordinator to follow up.  Delice Lesch, MD, [place], [place] 02/25/2017

## 2017-02-25 NOTE — Evaluation (Signed)
Clinical/Bedside Swallow Evaluation Patient Details  Name: [name] MRN: [id number] Date of Birth: 1971-07-25  Today's Date: 02/25/2017 Time: SLP Start Time (ACUTE ONLY): 0900 SLP Stop Time (ACUTE ONLY): 0910 SLP Time Calculation (min) (ACUTE ONLY): 10 min  Past Medical History:  Past Medical History:  Diagnosis Date  . Arthritis   . Asthma   . Chronic back pain   . Chronic chest pain   . Depression   . DM (diabetes mellitus) ([place])    INSULIN DEPENDENT  . GERD (gastroesophageal reflux disease)   . Headache(784.0)   . Hypertension   . Hypokalemia    Past Surgical History:  Past Surgical History:  Procedure Laterality Date  . APPENDECTOMY    . CESAREAN SECTION     x 3  . CHOLECYSTECTOMY N/A 03/02/2014   Procedure: LAPAROSCOPIC CHOLECYSTECTOMY;  Surgeon: Joyice Faster. Cornett, MD;  Location: [facility];  Service: General;  Laterality: N/A;  . HERNIA REPAIR    . ROTATOR CUFF REPAIR    . VESICOVAGINAL FISTULA CLOSURE W/ TAH  2009   HPI:  Brittney Mae Hutchinson-Matthewsis a 46 y.o.femaleWith asthma admitted with an asthma exacerbation requiring intubation 5/30-5/5. She developed AKI afterwards.     Assessment / Plan / Recommendation Clinical Impression  Pt demonstrates adequate tolerance of 3 oz of thin liquids consumed consecutively via straw with no signs of aspiration. RN also reports that pt has been observed to tolerate thin liquids since yesterday. She manages solids well. Given significant dysphonia following prolonged intubation, will f/u x1 for tolerance of diet in setting of potential for silent aspiration. Discussed signs with pt and her family.  SLP Visit Diagnosis: Dysphagia, oropharyngeal phase (R13.12)    Aspiration Risk  Mild aspiration risk    Diet Recommendation Regular;Thin liquid   Liquid Administration via: Cup;Straw Medication Administration: Whole meds with liquid Supervision: Staff to assist with self feeding Postural Changes: Seated  upright at 90 degrees    Other  Recommendations Oral Care Recommendations: Oral care BID   Follow up Recommendations None      Frequency and Duration min 2x/week  1 week       Prognosis Prognosis for Safe Diet Advancement: Good      Swallow Study   General HPI: Brittney Mae Hutchinson-Matthewsis a 46 y.o.femaleWith asthma admitted with an asthma exacerbation requiring intubation 5/30-5/5. She developed AKI afterwards.   Type of Study: Bedside Swallow Evaluation Previous Swallow Assessment: none Diet Prior to this Study: Regular;Thin liquids Temperature Spikes Noted: No Respiratory Status: Nasal cannula History of Recent Intubation: Yes Length of Intubations (days): 6 days Date extubated: 02/24/17 Behavior/Cognition: Alert;Cooperative;Pleasant mood Oral Cavity Assessment: Within Functional Limits Oral Care Completed by SLP: No Oral Cavity - Dentition: Adequate natural dentition Vision: Functional for self-feeding Self-Feeding Abilities: Able to feed self Patient Positioning: Upright in bed Baseline Vocal Quality: Hoarse Volitional Cough: Strong Volitional Swallow: Able to elicit    Oral/Motor/Sensory Function     Ice Chips     Thin Liquid Thin Liquid: Within functional limits Presentation: Straw    Nectar Thick Nectar Thick Liquid: Not tested   Honey Thick Honey Thick Liquid: Not tested   Puree Puree: Within functional limits   Solid   GO   Solid: Within functional limits       [facility], [place] CCC-SLP [phone]  Lynann Beaver 02/25/2017,11:04 AM

## 2017-02-26 DIAGNOSIS — E1169 Type 2 diabetes mellitus with other specified complication: Secondary | ICD-10-CM

## 2017-02-26 DIAGNOSIS — J9621 Acute and chronic respiratory failure with hypoxia: Secondary | ICD-10-CM

## 2017-02-26 DIAGNOSIS — G6281 Critical illness polyneuropathy: Secondary | ICD-10-CM

## 2017-02-26 DIAGNOSIS — E669 Obesity, unspecified: Secondary | ICD-10-CM

## 2017-02-26 DIAGNOSIS — G894 Chronic pain syndrome: Secondary | ICD-10-CM

## 2017-02-26 LAB — CBC
HEMATOCRIT: 37 % (ref 36.0–46.0)
HEMOGLOBIN: 12.3 g/dL (ref 12.0–15.0)
MCH: 28.7 pg (ref 26.0–34.0)
MCHC: 33.2 g/dL (ref 30.0–36.0)
MCV: 86.2 fL (ref 78.0–100.0)
PLATELETS: 316 10*3/uL (ref 150–400)
RBC: 4.29 MIL/uL (ref 3.87–5.11)
RDW: 14.7 % (ref 11.5–15.5)
WBC: 18.5 10*3/uL — AB (ref 4.0–10.5)

## 2017-02-26 LAB — GLUCOSE, CAPILLARY
GLUCOSE-CAPILLARY: 103 mg/dL — AB (ref 65–99)
GLUCOSE-CAPILLARY: 132 mg/dL — AB (ref 65–99)
GLUCOSE-CAPILLARY: 227 mg/dL — AB (ref 65–99)
Glucose-Capillary: 150 mg/dL — ABNORMAL HIGH (ref 65–99)
Glucose-Capillary: 160 mg/dL — ABNORMAL HIGH (ref 65–99)
Glucose-Capillary: 273 mg/dL — ABNORMAL HIGH (ref 65–99)

## 2017-02-26 LAB — RENAL FUNCTION PANEL
Albumin: 2.6 g/dL — ABNORMAL LOW (ref 3.5–5.0)
Anion gap: 11 (ref 5–15)
BUN: 34 mg/dL — AB (ref 6–20)
CHLORIDE: 104 mmol/L (ref 101–111)
CO2: 19 mmol/L — ABNORMAL LOW (ref 22–32)
CREATININE: 1.17 mg/dL — AB (ref 0.44–1.00)
Calcium: 8.8 mg/dL — ABNORMAL LOW (ref 8.9–10.3)
GFR calc Af Amer: >60 mL/min (ref >60)
GFR, EST NON AFRICAN AMERICAN: 55 mL/min — AB (ref >60)
GLUCOSE: 137 mg/dL — AB (ref 65–99)
POTASSIUM: 4.2 mmol/L (ref 3.5–5.1)
Phosphorus: 4.9 mg/dL — ABNORMAL HIGH (ref 2.5–4.6)
Sodium: 134 mmol/L — ABNORMAL LOW (ref 135–145)

## 2017-02-26 MED ORDER — DIPHENHYDRAMINE HCL 50 MG/ML IJ SOLN
25.0000 mg | Freq: Once | INTRAMUSCULAR | Status: AC
  Administered 2017-02-26: 25 mg via INTRAVENOUS
  Filled 2017-02-26: qty 1

## 2017-02-26 MED ORDER — PREDNISONE 50 MG PO TABS
50.0000 mg | ORAL_TABLET | Freq: Every day | ORAL | Status: DC
  Administered 2017-02-27: 50 mg via ORAL
  Filled 2017-02-26: qty 1

## 2017-02-26 MED ORDER — KETOROLAC TROMETHAMINE 30 MG/ML IJ SOLN
30.0000 mg | Freq: Once | INTRAMUSCULAR | Status: AC
Start: 2017-02-26 — End: 2017-02-26
  Administered 2017-02-26: 30 mg via INTRAVENOUS
  Filled 2017-02-26: qty 1

## 2017-02-26 NOTE — Progress Notes (Signed)
Physical Therapy Treatment Patient Details Name: [name] MRN: [id number] DOB: 02/20/1971 Today's Date: 02/26/2017    History of Present Illness Pt adm with severe asthma exacerbation requiring intubation 5/31-6/5. Developed AKI and on CRRT 5/31-6/4. PMH - ashtma, DM, Obesity, chronic back pain    PT Comments    Patient progressing well towards PT goals. Tolerated standing bouts today with assist for safety/balance. Pt not able to activate RLE sitting in chair but demonstrates functional strength in standing only requiring Min A to stabilize progressing to min guard to prevent knee buckling. Able to initiate pre gait and take a few steps with assist for weight shifting and right knee stability. HR up to 130s during session. Will continue to follow and progress as tolerated.   Follow Up Recommendations  CIR     Equipment Recommendations  Other (comment)    Recommendations for Other Services       Precautions / Restrictions Precautions Precautions: Fall Precaution Comments: HR intermittently tachy this session Restrictions Weight Bearing Restrictions: No    Mobility  Bed Mobility               General bed mobility comments: Pt up in recliner upon our arrival  Transfers Overall transfer level: Needs assistance Equipment used: Rolling walker (2 wheeled) Transfers: Sit to/from Stand Sit to Stand: Min guard;Mod assist;Min assist         General transfer comment: intially stand from recliner Mod A +2, second stand min A +2, 3rd stand min guard A +2 with use of body momentum; stabilizing Right knee initially.  Ambulation/Gait Ambulation/Gait assistance: Min assist;+2 safety/equipment Ambulation Distance (Feet): 3 Feet Assistive device: Rolling walker (2 wheeled) Gait Pattern/deviations: Step-to pattern;Decreased stride length;Decreased stance time - right;Trunk flexed Gait velocity: decreased Gait velocity interpretation: Below normal speed for  age/gender General Gait Details: Able to take a few steps with assist for weight shifting and to stabilize right knee. Fatigues. HR up to 130s bpm.   Stairs            Wheelchair Mobility    Modified Rankin (Stroke Patients Only)       Balance Overall balance assessment: Needs assistance Sitting-balance support: Feet supported;No upper extremity supported Sitting balance-[name]: Poor     Standing balance support: During functional activity Standing balance-[name]: Poor Standing balance comment: reliant on RW for support. Cues for upright posture. Able to stand with cues for upright posture.                             Cognition Arousal/Alertness: Awake/alert Behavior During Therapy: Flat affect Overall Cognitive Status: Within Functional Limits for tasks assessed                                 General Comments: SLow to respond but this seems to be baseline.      Exercises      General Comments General comments (skin integrity, edema, etc.): Spouse present during session.      Pertinent Vitals/Pain Pain Assessment: Faces Faces Pain Scale: Hurts little more Pain Location: headache (migraine) Pain Descriptors / Indicators: Aching Pain Intervention(s): Limited activity within patient's tolerance;Repositioned;Monitored during session    Home Living Family/patient expects to be discharged to:: Private residence Living Arrangements: Spouse/significant other;Children Available Help at Discharge: Family;Available 24 hours/day Type of Home: House Home Access: Stairs to enter Entrance Stairs-Rails: Right Home  Layout: One level Home Equipment: Shower seat - built in;Grab bars - tub/shower;Hand held shower head      Prior Function Level of Independence: Independent          PT Goals (current goals can now be found in the care plan section) Acute Rehab PT Goals Patient Stated Goal: to go home Progress towards PT goals:  Progressing toward goals    Frequency    Min 3X/week      PT Plan Current plan remains appropriate    Co-evaluation PT/OT/SLP Co-Evaluation/Treatment: Yes Reason for Co-Treatment: For patient/therapist safety;To address functional/ADL transfers          AM-PAC PT "6 Clicks" Daily Activity  Outcome Measure  Difficulty turning over in bed (including adjusting bedclothes, sheets and blankets)?: Total Difficulty moving from lying on back to sitting on the side of the bed? : Total Difficulty sitting down on and standing up from a chair with arms (e.g., wheelchair, bedside commode, etc,.)?: Total Help needed moving to and from a bed to chair (including a wheelchair)?: A Lot Help needed walking in hospital room?: A Lot Help needed climbing 3-5 steps with a railing? : Total 6 Click Score: 8    End of Session Equipment Utilized During Treatment: Gait belt Activity Tolerance: Patient limited by fatigue;Patient tolerated treatment well Patient left: in chair;with call bell/phone within reach;with family/visitor present Nurse Communication: Mobility status PT Visit Diagnosis: Muscle weakness (generalized) (M62.81);Difficulty in walking, not elsewhere classified (R26.2)     Time: 3567-0141 PT Time Calculation (min) (ACUTE ONLY): 23 min  Charges:  $Therapeutic Activity: 8-22 mins                    G Codes:       Wray Kearns, PT, DPT [phone]     Marguarite Arbour A Chayce Robbins 02/26/2017, 10:06 AM

## 2017-02-26 NOTE — H&P (Signed)
Physical Medicine and Rehabilitation Admission H&P    Chief Complaint  Patient presents with  . Critical illness polyneuropathy    HPI: [name] is a 46 y.o. female with history of asthma, tobacco use, T2DM, chronic pain, HA; who was admitted on 02/18/17 with asthma exacerbation requiring intubation. History taken from chart review and multiple family members. She was treated with steroids and antibiotics with improvement and tolerated extubation on 6/5.  She developed AKI requiring brief CRRT/HD thorough 6/4 and is showing improvement in SCr and UOP.   CP/SOB due to demand ischemia and 2 D echo showed EF 65-70% with grade I diastolic dysfunction. She developed fever on 6/3 and respiratory cultures positive for moraxella and sputum with rhinovirus. She was started on Zithromax for treatment of HCAP. Hyperglycemia being managed with SSI and BP medications being titrated upwards for tighter control.  Therapy initiated and patient with significant deficits in mobility and ability to carry out ADL tasks due to critical illness polyneuropathy. CIR was recommended for follow up therapy.    Review of Systems  HENT: Negative for hearing loss and tinnitus.   Eyes: Negative for blurred vision and double vision.  Cardiovascular: Negative for chest pain and palpitations.  Gastrointestinal: Negative for abdominal pain, constipation and nausea.       Severe throat pain  Genitourinary: Negative for dysuria and urgency.  Musculoskeletal: Positive for back pain and myalgias.  Skin: Negative for itching and rash.  Neurological: Positive for sensory change (tingling RUE/RLE), weakness and headaches (chronic HA).  Psychiatric/Behavioral: The patient is nervous/anxious.       Past Medical History:  Diagnosis Date  . Arthritis   . Asthma   . Chronic back pain   . Chronic chest pain   . Depression   . DM (diabetes mellitus) ([place])    INSULIN DEPENDENT  . GERD (gastroesophageal  reflux disease)   . Headache(784.0)   . Hypertension   . Hypokalemia     Past Surgical History:  Procedure Laterality Date  . APPENDECTOMY    . CESAREAN SECTION     x 3  . CHOLECYSTECTOMY N/A 03/02/2014   Procedure: LAPAROSCOPIC CHOLECYSTECTOMY;  Surgeon: Joyice Faster. Cornett, MD;  Location: [place];  Service: General;  Laterality: N/A;  . HERNIA REPAIR    . ROTATOR CUFF REPAIR    . VESICOVAGINAL FISTULA CLOSURE W/ TAH  2009    Family History  Problem Relation Age of Onset  . Heart attack Mother   . Stroke Mother   . Diabetes Mother   . Hypertension Mother   . Arthritis Mother   . Stroke Father   . Hypertension Sister   . Diabetes Sister   . Seizures Brother   . Diabetes Brother     Social History:  [name] with husband and grown children. Disabled due to multiple medical issues. Sedentary and limited by SOB but manages home/meal prep. She reports that she has been smoking Cigarettes one pack every 3 days.  She has a 5.50 pack-year smoking history. She has never used smokeless tobacco. She reports that she drinks about 0.6 - 1.2 oz of alcohol per week . She reports that she does not use drugs.     Allergies  Allergen Reactions  . Reglan [Metoclopramide] Other (See Comments)    Panic attack    Medications Prior to Admission  Medication Sig Dispense Refill  . albuterol (PROVENTIL HFA;VENTOLIN HFA) 108 (90 Base) MCG/ACT inhaler Inhale 2 puffs into the lungs every  6 (six) hours as needed for wheezing or shortness of breath. Reported on 02/22/2016 1 each 3  . albuterol (PROVENTIL) (2.5 MG/3ML) 0.083% nebulizer solution Take 3 mLs (2.5 mg total) by nebulization every 6 (six) hours as needed for wheezing or shortness of breath. Reported on 02/22/2016 75 mL 3  . amLODipine (NORVASC) 5 MG tablet Take 1 tablet (5 mg total) by mouth daily. 90 tablet 1  . aspirin EC 325 MG tablet Take 325 mg by mouth daily.    . bromocriptine (PARLODEL) 2.5 MG tablet 1/4 tab daily (Patient taking  differently: Take 0.625 mg by mouth daily. ) 20 tablet 2  . dapagliflozin propanediol (FARXIGA) 10 MG TABS tablet Take 10 mg by mouth daily. 30 tablet 11  . Dulaglutide (TRULICITY) 1.5 HR/4.1UL SOPN Inject 1.5 mg into the skin once a week. 4 pen 11  . Fluticasone-Salmeterol (ADVAIR) 250-50 MCG/DOSE AEPB Inhale 1 puff into the lungs 2 times daily at 12 noon and 4 pm. 60 each 3  . gabapentin (NEURONTIN) 300 MG capsule Take 1 capsule (300 mg total) by mouth 3 (three) times daily. 90 capsule 3  . hydrochlorothiazide (HYDRODIURIL) 25 MG tablet Take 1 tablet (25 mg total) by mouth daily. 30 tablet 4  . losartan (COZAAR) 100 MG tablet Take 1 tablet (100 mg total) by mouth daily. For Blood pressure - must have office visit for refills 30 tablet 3  . methocarbamol (ROBAXIN) 500 MG tablet Take 1 tablet (500 mg total) by mouth every 8 (eight) hours as needed for muscle spasms. 90 tablet 1  . naproxen (NAPROSYN) 500 MG tablet Take 1 tablet (500 mg total) by mouth 2 (two) times daily with a meal. 60 tablet 1  . olopatadine (PATANOL) 0.1 % ophthalmic solution Place 1 drop into the right eye 2 (two) times daily. 5 mL 0  . oxyCODONE-acetaminophen (PERCOCET) 5-325 MG tablet Take 1-2 tablets by mouth every 4 (four) hours as needed. (Patient taking differently: Take 1-2 tablets by mouth every 4 (four) hours as needed for moderate pain. ) 20 tablet 0  . pantoprazole (PROTONIX) 40 MG tablet Take 1 tablet (40 mg total) by mouth daily. 30 tablet 3  . prochlorperazine (COMPAZINE) 10 MG tablet Take 1 tablet (10 mg total) by mouth 2 (two) times daily as needed for nausea or vomiting. 10 tablet 0  . repaglinide (PRANDIN) 2 MG tablet 1 tab with breakfast and lunch, and 2 tabs with dinner. (Patient taking differently: Take 2-4 mg by mouth See admin instructions. Take 2 mg by mouth with breakfast and lunch, and 4 mg by mouth with dinner.) 120 tablet 11  . sertraline (ZOLOFT) 50 MG tablet Take 1 tablet (50 mg total) by mouth daily.  30 tablet 3  . simvastatin (ZOCOR) 10 MG tablet Take 1 tablet (10 mg total) by mouth daily at 6 PM. 30 tablet 3  . SUMAtriptan (IMITREX) 100 MG tablet TAKE 1TAB AT EARLIEST ONSET OF HEADACHE. MAY REPEAT X1 IN 2 HOURS IF HEADACHE PERSISTS OR RECURS. DO NOT EXCEED 2 TABS IN 24HRS 9 tablet 2  . topiramate (TOPAMAX) 100 MG tablet Take 1 tablet (100 mg total) by mouth 2 (two) times daily. 60 tablet 3  . acetaminophen-codeine (TYLENOL #3) 300-30 MG tablet Take 1 tablet by mouth every 12 (twelve) hours as needed for moderate pain. 30 tablet 0  . Blood Glucose Monitoring Suppl (ACCU-CHEK AVIVA PLUS) w/Device KIT Use to check blood sugar 3 times per day. DX CODE: E11.9 (Patient not taking:  Reported on 02/19/2017) 1 kit 2  . glucose blood (ACCU-CHEK AVIVA PLUS) test strip Use to check blood sugar 3 times per day.DX Code E11.9 (Patient not taking: Reported on 02/19/2017) 100 each 12  . Insulin Pen Needle 32G X 4 MM MISC Use to inject insulin 3 times per day. (Patient not taking: Reported on 02/19/2017) 100 each 2  . Lancet Devices (ACCU-CHEK SOFTCLIX) lancets Use to check blood sugar 3 times per day dx code E11.9 (Patient not taking: Reported on 02/19/2017) 100 each 2  . Lancets (ACCU-CHEK MULTICLIX) lancets Use to check blood sugar 3 times per day. DX code E11.9 (Patient not taking: Reported on 02/19/2017) 100 each 12    Home: [place] expects to be discharged to:: Private residence Living Arrangements: Spouse/significant other, Children Available Help at Discharge: Family, Available 24 hours/day Type of Home: House Home Access: Stairs to enter [employer] of Steps: several Entrance Stairs-Rails: Right Home Layout: One level Bathroom Shower/Tub: Gaffer, Architectural technologist: Standard Home Equipment: Civil engineer, contracting - built in, [employer] - tub/shower, Hand held shower head   Functional History: Prior Function Level of Independence: Independent  Functional Status:    Mobility: Bed Mobility Overal bed mobility: Needs Assistance Bed Mobility: Supine to Sit, Sit to Supine Supine to sit: Total assist Sit to supine: +2 for physical assistance, Max assist General bed mobility comments: Pt up in recliner upon our arrival Transfers Overall transfer level: Needs assistance Equipment used: Rolling walker (2 wheeled) Transfers: Sit to/from Stand Sit to Stand: Min guard, Mod assist, Min assist General transfer comment: intially stand from recliner Mod A +2, second stand min A +2, 3rd stand min guard A +2 with use of body momentum; stabilizing Right knee initially. Ambulation/Gait Ambulation/Gait assistance: Min assist, +2 safety/equipment Ambulation Distance (Feet): 3 Feet Assistive device: Rolling walker (2 wheeled) Gait Pattern/deviations: Step-to pattern, Decreased stride length, Decreased stance time - right, Trunk flexed General Gait Details: Able to take a few steps with assist for weight shifting and to stabilize right knee. Fatigues. HR up to 130s bpm. Gait velocity: decreased Gait velocity interpretation: Below normal speed for age/gender    ADL: ADL Overall ADL's : Needs assistance/impaired Eating/Feeding: Maximal assistance Eating/Feeding Details (indicate cue type and reason): supported sitting Grooming: Maximal assistance Grooming Details (indicate cue type and reason): supported sitting Upper Body Bathing: Maximal assistance Upper Body Bathing Details (indicate cue type and reason): supported sitting Lower Body Bathing: Total assistance Lower Body Bathing Details (indicate cue type and reason): Mod A +2 intially to min guard A +2 at end of session Upper Body Dressing : Total assistance Upper Body Dressing Details (indicate cue type and reason): supported sitting Lower Body Dressing: Total assistance Lower Body Dressing Details (indicate cue type and reason): Mod A +2 intially to min guard A +2 at end of  session  Cognition: Cognition Overall Cognitive Status: Within Functional Limits for tasks assessed Orientation Level: Oriented X4 Cognition Arousal/Alertness: Awake/alert Behavior During Therapy: Flat affect Overall Cognitive Status: Within Functional Limits for tasks assessed Area of Impairment: Problem solving Problem Solving: Slow processing, Decreased initiation General Comments: SLow to respond but this seems to be baseline.   Blood pressure 125/88, pulse (!) 104, temperature 98.8 F (37.1 C), temperature source Oral, resp. rate 20, height _0  (1.549 m), weight 107.4 kg (236 lb 12.4 oz), SpO2 99 %. Physical Exam  Nursing note and vitals reviewed. Constitutional: She is oriented to person, place, and time. She appears well-developed and  well-nourished.  Restless and writhing in bed due to throat pain/burning as well as headaches.   HENT:  Head: Normocephalic and atraumatic.  Mouth/Throat: Oropharynx is clear and moist.  White coating on tongue  Eyes: Conjunctivae and EOM are normal. Pupils are equal, round, and reactive to light.  Neck: Normal range of motion. Neck supple.  Cardiovascular: Normal rate and regular rhythm.   Respiratory: Effort normal. No stridor. No respiratory distress. She has wheezes. She exhibits no tenderness.  Coarse breath sound throughout with occasional wheeze.   GI: Soft. Bowel sounds are normal. She exhibits no distension. There is no tenderness.  Musculoskeletal: She exhibits edema. She exhibits no tenderness.  Neurological: She is alert and oriented to person, place, and time.  UE 2/5 prox to 2+ distally bilaterally--?effort. LE: 1/5 HF, KE and 1+ to 2/5 ADF/PF. Decreased sensation in stocking glove distribution from upper calves distally. Fingers may be involved also but exam inconsistent.   Skin: Skin is warm and dry.  Psychiatric: Her speech is normal. Her mood appears anxious. She is agitated. Cognition and memory are not impaired.     Results for orders placed or performed during the hospital encounter of 02/18/17 (from the past 48 hour(s))  Glucose, capillary     Status: Abnormal   Collection Time: 02/24/17  7:19 PM  Result Value Ref Range   Glucose-Capillary 112 (H) 65 - 99 mg/dL   Comment 1 Notify RN   Glucose, capillary     Status: Abnormal   Collection Time: 02/25/17 12:55 AM  Result Value Ref Range   Glucose-Capillary 187 (H) 65 - 99 mg/dL   Comment 1 Notify RN   Renal function panel (daily at 0500)     Status: Abnormal   Collection Time: 02/25/17  2:35 AM  Result Value Ref Range   Sodium 136 135 - 145 mmol/L   Potassium 4.7 3.5 - 5.1 mmol/L    Comment: DELTA CHECK NOTED   Chloride 104 101 - 111 mmol/L   CO2 20 (L) 22 - 32 mmol/L   Glucose, Bld 174 (H) 65 - 99 mg/dL   BUN 45 (H) 6 - 20 mg/dL   Creatinine, Ser 1.25 (H) 0.44 - 1.00 mg/dL   Calcium 8.8 (L) 8.9 - 10.3 mg/dL   Phosphorus 5.1 (H) 2.5 - 4.6 mg/dL   Albumin 2.5 (L) 3.5 - 5.0 g/dL   GFR calc non Af Amer 50 (L) >60 mL/min   GFR calc Af Amer 58 (L) >60 mL/min    Comment: (NOTE) The eGFR has been calculated using the CKD EPI equation. This calculation has not been validated in all clinical situations. eGFR's persistently <60 mL/min signify possible Chronic Kidney Disease.    Anion gap 12 5 - 15  CBC with Differential/Platelet     Status: Abnormal   Collection Time: 02/25/17  2:35 AM  Result Value Ref Range   WBC 19.8 (H) 4.0 - 10.5 K/uL   RBC 3.89 3.87 - 5.11 MIL/uL   Hemoglobin 11.4 (L) 12.0 - 15.0 g/dL   HCT 34.3 (L) 36.0 - 46.0 %   MCV 88.2 78.0 - 100.0 fL   MCH 29.3 26.0 - 34.0 pg   MCHC 33.2 30.0 - 36.0 g/dL   RDW 15.6 (H) 11.5 - 15.5 %   Platelets 272 150 - 400 K/uL   Neutrophils Relative % 69 %   Lymphocytes Relative 22 %   Monocytes Relative 9 %   Eosinophils Relative 0 %   Basophils Relative 0 %  Neutro Abs 13.6 (H) 1.7 - 7.7 K/uL   Lymphs Abs 4.4 (H) 0.7 - 4.0 K/uL   Monocytes Absolute 1.8 (H) 0.1 - 1.0 K/uL    Eosinophils Absolute 0.0 0.0 - 0.7 K/uL   Basophils Absolute 0.0 0.0 - 0.1 K/uL  Glucose, capillary     Status: Abnormal   Collection Time: 02/25/17  3:40 AM  Result Value Ref Range   Glucose-Capillary 178 (H) 65 - 99 mg/dL   Comment 1 Notify RN   Glucose, capillary     Status: None   Collection Time: 02/25/17  7:15 AM  Result Value Ref Range   Glucose-Capillary 94 65 - 99 mg/dL   Comment 1 Capillary Specimen   Glucose, capillary     Status: Abnormal   Collection Time: 02/25/17 11:36 AM  Result Value Ref Range   Glucose-Capillary 251 (H) 65 - 99 mg/dL   Comment 1 Capillary Specimen   Glucose, capillary     Status: Abnormal   Collection Time: 02/25/17  4:21 PM  Result Value Ref Range   Glucose-Capillary 190 (H) 65 - 99 mg/dL   Comment 1 Capillary Specimen    Comment 2 Notify RN   Glucose, capillary     Status: Abnormal   Collection Time: 02/25/17  7:31 PM  Result Value Ref Range   Glucose-Capillary 178 (H) 65 - 99 mg/dL   Comment 1 Capillary Specimen    Comment 2 Notify RN   Glucose, capillary     Status: Abnormal   Collection Time: 02/26/17 12:30 AM  Result Value Ref Range   Glucose-Capillary 132 (H) 65 - 99 mg/dL   Comment 1 Notify RN   Glucose, capillary     Status: Abnormal   Collection Time: 02/26/17  3:59 AM  Result Value Ref Range   Glucose-Capillary 150 (H) 65 - 99 mg/dL  Renal function panel     Status: Abnormal   Collection Time: 02/26/17  4:50 AM  Result Value Ref Range   Sodium 134 (L) 135 - 145 mmol/L   Potassium 4.2 3.5 - 5.1 mmol/L   Chloride 104 101 - 111 mmol/L   CO2 19 (L) 22 - 32 mmol/L   Glucose, Bld 137 (H) 65 - 99 mg/dL   BUN 34 (H) 6 - 20 mg/dL   Creatinine, Ser 1.17 (H) 0.44 - 1.00 mg/dL   Calcium 8.8 (L) 8.9 - 10.3 mg/dL   Phosphorus 4.9 (H) 2.5 - 4.6 mg/dL   Albumin 2.6 (L) 3.5 - 5.0 g/dL   GFR calc non Af Amer 55 (L) >60 mL/min   GFR calc Af Amer >60 >60 mL/min    Comment: (NOTE) The eGFR has been calculated using the CKD EPI  equation. This calculation has not been validated in all clinical situations. eGFR's persistently <60 mL/min signify possible Chronic Kidney Disease.    Anion gap 11 5 - 15  CBC     Status: Abnormal   Collection Time: 02/26/17  7:10 AM  Result Value Ref Range   WBC 18.5 (H) 4.0 - 10.5 K/uL    Comment: WHITE COUNT CONFIRMED ON SMEAR   RBC 4.29 3.87 - 5.11 MIL/uL   Hemoglobin 12.3 12.0 - 15.0 g/dL   HCT 37.0 36.0 - 46.0 %   MCV 86.2 78.0 - 100.0 fL   MCH 28.7 26.0 - 34.0 pg   MCHC 33.2 30.0 - 36.0 g/dL   RDW 14.7 11.5 - 15.5 %   Platelets 316 150 - 400 K/uL  Comment: PLATELET COUNT CONFIRMED BY SMEAR  Glucose, capillary     Status: Abnormal   Collection Time: 02/26/17  8:38 AM  Result Value Ref Range   Glucose-Capillary 160 (H) 65 - 99 mg/dL  Glucose, capillary     Status: Abnormal   Collection Time: 02/26/17 12:22 PM  Result Value Ref Range   Glucose-Capillary 273 (H) 65 - 99 mg/dL   Comment 1 Notify RN    Comment 2 Document in Chart    Dg Chest Port 1 View  Result Date: 02/25/2017 CLINICAL DATA:  Respiratory failure. EXAM: PORTABLE CHEST 1 VIEW COMPARISON:  02/24/2017. FINDINGS: Interim removal of endotracheal tube and NG tube. Interim removal of right IJ line. Mediastinum and hilar structures are normal. Cardiomegaly with normal pulmonary vascularity. Low lung volumes. Improved bibasilar atelectasis . Small left pleural effusion. No pneumothorax. IMPRESSION: 1. Interim extubation and removal of NG tube. Interim removal of right IJ line . 2. Stable cardiomegaly. 3. Lung volumes. Improved bibasilar atelectasis. Small left pleural effusion again noted. Electronically Signed   By: Marcello Moores  Register   On: 02/25/2017 06:23       Medical Problem List and Plan: 1.  Functional and mobility deficits secondary to critical illness myopathy/neuropathy  -admit to inpatient rehab 2.  DVT Prophylaxis/Anticoagulation: Pharmaceutical: Lovenox 3. Chronic back pain with radiculopathy/Pain  Management: Has been getting IV fentanyl--discontinued. Will use hydrocodone---pt took this at home q6 prn 4. Mood: team to provide ego support. Continue zoloft. LCSW to follow for evaluation and support.  5. Neuropsych: This patient is capable of making decisions on her own behalf. 6. Skin/Wound Care: Routine pressure relief measures.  7. Fluids/Electrolytes/Nutrition: Monitor I/O. Trend SCr for recovery.  8. Asthma exacerbation: Continues on steroids for 3 additional days. Continue brovana and duonebs. Encourage aggressive pulmonary hygiene.   9. Moraxella HCAP: completes Zithromax tomorrow.  Monitor WBC and temp curves 10. AKI: Avoid nephrotoxic drugs--received dose Toradol yesterday.  Continue to monitor daily for recovery. 11.HTN: Monitor BP bid-=poorly controlled.  On losartan and Norvasc (dose increased yesterday)  12. T2DM: Monitor BS ac/hs. Will resume prandin and dulaglutide.   13. Morbid obesity: diet education for weight loss and to promoted over all health.  14. Leucocytosis:  Likely reactive due to infection and steroids.  Continue to monitor.  15. Severe Throat pain: will add Carafate ac/hs with MMW prn. Question thrush--will monitor.   16. Migraines: has been refusing Topamax. Continue Imitrex prn.  17. Neuropathy: Increase gabapentin to tid to help manage dysesthesias RUE  Post Admission Physician Evaluation: 1. Functional deficits secondary  to critical illness myopathy/neuropathy. 2. Patient is admitted to receive collaborative, interdisciplinary care between the physiatrist, rehab nursing staff, and therapy team. 3. Patient's level of medical complexity and substantial therapy needs in context of that medical necessity cannot be provided at a lesser intensity of care such as a SNF. 4. Patient has experienced substantial functional loss from his/her baseline which was documented above under the "Functional History" and "Functional Status" headings.  Judging by the patient's  diagnosis, physical exam, and functional history, the patient has potential for functional progress which will result in measurable gains while on inpatient rehab.  These gains will be of substantial and practical use upon discharge  in facilitating mobility and self-care at the household level. 5. Physiatrist will provide 24 hour management of medical needs as well as oversight of the therapy plan/treatment and provide guidance as appropriate regarding the interaction of the two. 6. The Preadmission Screening has been  reviewed and patient status is unchanged unless otherwise stated above. 7. 24 hour rehab nursing will assist with bladder management, bowel management, safety, skin/wound care, disease management, medication administration, pain management and patient education  and help integrate therapy concepts, techniques,education, etc. 8. PT will assess and treat for/with: Lower extremity strength, range of motion, stamina, balance, functional mobility, safety, adaptive techniques and equipment, NMR, w/c assessment, orthotics, famiy ed.   Goals are: min to mod assist likely at w/c level. 9. OT will assess and treat for/with: ADL's, functional mobility, safety, upper extremity strength, adaptive techniques and equipment, NMR, ego support, family ed.   Goals are: min to mod assist. Therapy may proceed with showering this patient. 10. SLP will assess and treat for/with: speech, communication, swallowing.  Goals are: mod I. 11. Case Management and Social Worker will assess and treat for psychological issues and discharge planning. 12. Team conference will be held weekly to assess progress toward goals and to determine barriers to discharge. 13. Patient will receive at least 3 hours of therapy per day at least 5 days per week. 14. ELOS: 20-25 days       15. Prognosis:  excellent     Meredith Staggers, MD, [facility] 02/27/2017  Bary Leriche,  Hershal Coria 02/26/2017

## 2017-02-26 NOTE — Evaluation (Signed)
Occupational Therapy Evaluation Patient Details Name: [name] MRN: [id number] DOB: 1971/06/11 Today's Date: 02/26/2017    History of Present Illness Pt adm with severe asthma exacerbation requiring intubation 5/31-6/5. Developed AKI and on CRRT 5/31-6/4. PMH - ashtma, DM, Obesity, chronic back pain   Clinical Impression   This 46 yo female admitted with above presents to acute OT with deficits below (see OT problem list) thus affecting her PLOF of Mod I to independent with basic ADLs. She will benefit from acute OT with follow up OT on CIR to get back to PLOF.    Follow Up Recommendations  CIR;Supervision/Assistance - 24 hour    Equipment Recommendations  Other (comment) (TBD at next venue)       Precautions / Restrictions Precautions Precautions: Fall Precaution Comments: HR intermittently tachy this session Restrictions Weight Bearing Restrictions: No      Mobility Bed Mobility               General bed mobility comments: Pt up in recliner upon our arrival  Transfers Overall transfer level: Needs assistance Equipment used: Rolling walker (2 wheeled) Transfers: Sit to/from Stand Sit to Stand:  (see below)         General transfer comment: intially stand from recliner Mod A +2, second stand min A +2, 3rd stand min guard A +2    Balance Overall balance assessment: Needs assistance Sitting-balance support: Feet supported;Bilateral upper extremity supported Sitting balance-[name]: Poor     Standing balance support: Bilateral upper extremity supported Standing balance-[name]: Poor Standing balance comment: reliant on RW                           ADL either performed or assessed with clinical judgement   ADL Overall ADL's : Needs assistance/impaired Eating/Feeding: Maximal assistance Eating/Feeding Details (indicate cue type and reason): supported sitting Grooming: Maximal assistance Grooming Details (indicate cue  type and reason): supported sitting Upper Body Bathing: Maximal assistance Upper Body Bathing Details (indicate cue type and reason): supported sitting Lower Body Bathing: Total assistance Lower Body Bathing Details (indicate cue type and reason): Mod A +2 intially to min guard A +2 at end of session Upper Body Dressing : Total assistance Upper Body Dressing Details (indicate cue type and reason): supported sitting Lower Body Dressing: Total assistance Lower Body Dressing Details (indicate cue type and reason): Mod A +2 intially to min guard A +2 at end of session                     Vision Patient Visual Report: No change from baseline              Pertinent Vitals/Pain Pain Assessment: Faces Faces Pain Scale: Hurts little more Pain Location: headache (migraine) Pain Descriptors / Indicators: Aching Pain Intervention(s): Limited activity within patient's tolerance;Repositioned     Hand Dominance Right   Extremity/Trunk Assessment Upper Extremity Assessment Upper Extremity Assessment: RUE deficits/detail;LUE deficits/detail RUE Deficits / Details: edematous, can barely lift arm off of arm rest of recliner, can use it to pull herself forward in recliner with use of arm rests RUE Coordination: decreased fine motor;decreased gross motor LUE Deficits / Details: edematous; grossly 2+/5 LUE Coordination: decreased fine motor;decreased gross motor           Communication Communication Communication:  (hoarse voice due to s/p intubation)   Cognition Arousal/Alertness: Awake/alert Behavior During Therapy: Flat affect Overall Cognitive Status: Within Functional Limits  for tasks assessed                                                Home Living Family/patient expects to be discharged to:: Private residence Living Arrangements: Spouse/significant other;Children Available Help at Discharge: Family;Available 24 hours/day Type of Home: House Home  Access: Stairs to enter [employer] of Steps: several Entrance Stairs-Rails: Right Home Layout: One level     Bathroom Shower/Tub: Walk-in Hydrologist: Standard     Home Equipment: Shower seat - built in;Grab bars - tub/shower;Hand held shower head          Prior Functioning/Environment Level of Independence: Independent                 OT Problem List: Decreased strength;Decreased range of motion;Impaired balance (sitting and/or standing);Pain;Obesity;Impaired UE functional use;Increased edema      OT Treatment/Interventions: Self-care/ADL training;Patient/family education;Balance training;DME and/or AE instruction;Therapeutic activities    OT Goals(Current goals can be found in the care plan section) Acute Rehab OT Goals Patient Stated Goal: to go home OT Goal Formulation: With patient/family Time For Goal Achievement: 03/12/17 Potential to Achieve Goals: Good  OT Frequency: Min 2X/week           Co-evaluation   Reason for Co-Treatment: For patient/therapist safety;To address functional/ADL transfers          AM-PAC PT "6 Clicks" Daily Activity     Outcome Measure Help from another person eating meals?: A Lot Help from another person taking care of personal grooming?: A Lot Help from another person toileting, which includes using toliet, bedpan, or urinal?: Total Help from another person bathing (including washing, rinsing, drying)?: A Lot Help from another person to put on and taking off regular upper body clothing?: Total Help from another person to put on and taking off regular lower body clothing?: Total 6 Click Score: 9   End of Session Equipment Utilized During Treatment: Gait belt;Rolling walker Nurse Communication:  (pt c/o migraine )  Activity Tolerance: Patient tolerated treatment well Patient left: in chair;with call bell/phone within reach;with family/visitor present  OT Visit Diagnosis: Unsteadiness on feet  (R26.81);Muscle weakness (generalized) (M62.81);Pain Pain - part of body:  (head)                Time: 2683-4196 OT Time Calculation (min): 24 min Charges:  OT General Charges $OT Visit: 1 Procedure OT Evaluation $OT Eval Moderate Complexity: 1 Procedure Golden Circle, OTR/L [phone] 02/26/2017

## 2017-02-26 NOTE — H&P (Deleted)
  The note originally documented on this encounter has been moved the the encounter in which it belongs.  

## 2017-02-26 NOTE — Progress Notes (Signed)
Rehab admissions - I met with patient, husband, and her children at the bedside.  Patient did well with therapies today, but says that she is tired.  She would like to come to inpatient rehab.  I am hopeful for an inpatient rehab bed to become available tomorrow and can potentially admit tomorrow if patient is medically stable.  Call me for questions.  #[phone]

## 2017-02-26 NOTE — Progress Notes (Signed)
  Speech Language Pathology Treatment: Dysphagia  Patient Details Name: [name] MRN: [id number] DOB: 13-Sep-1971 Today's Date: 02/26/2017 Time: 9030-1499 SLP Time Calculation (min) (ACUTE ONLY): 10 min  Assessment / Plan / Recommendation Clinical Impression  Pt demonstrates tolerance of thin liquids with no immediate or delayed signs of aspiration despite ongoing hoarse vocal quality. Pt, husband and RN also report tolerance. Discussed f/u with MD if vocal quality does not improve over 2 weeks. Pt may continue current diet. No SLP f/u needed.   HPI HPI: Mulan Adan Hutchinson-Matthewsis a 46 y.o.femaleWith asthma admitted with an asthma exacerbation requiring intubation 5/30-5/5. She developed AKI afterwards.        SLP Plan  All goals met       Recommendations  Diet recommendations: Regular;Thin liquid Liquids provided via: Cup;Straw Medication Administration: Whole meds with liquid Supervision: Trained caregiver to feed patient Postural Changes and/or Swallow Maneuvers: Seated upright 90 degrees                Follow up Recommendations: None Plan: All goals met       GO               Herbie Baltimore, MA CCC-SLP [phone]  Lynann Beaver 02/26/2017, 9:22 AM

## 2017-02-26 NOTE — PMR Pre-admission (Signed)
PMR Admission Coordinator Pre-Admission Assessment  Patient: [name] is an 46 y.o., female MRN: [id number] DOB: 1970-12-18 Height: 5\' 1"  (154.9 cm) Weight: 105.7 kg (233 lb 0.4 oz)              Insurance Information HMO: No   PPO:       PCP:       IPA:       80/20:       OTHER:   PRIMARY:  Medicare A/B      Policy#:  [id number]      Subscriber: patient CM Name:        Phone#:       Fax#:   Pre-Cert#:        Employer: Not employed Benefits:  Phone #:       Name: Checked in Financial planner. Date:  06/22/01     Deduct:  $1340      Out of Pocket Max:  none      Life Max: Unlimited CIR: 100%      SNF: 100 days Outpatient:  80%     Co-Pay: 20% Home Health: 100%      Co-Pay:  none DME: 80%     Co-Pay: 20% Providers: patient's choice  SECONDARY: Medicaid [place] Access      Policy#:  [id number]      Subscriber:  patient CM Name:        Phone#:       Fax#:   Pre-Cert#:        Employer: Not employed Benefits:  Phone #: [phone]     Name:  Automated Eff. Date: Eligible 02/26/17 with coverage code [id number]     Deduct:        Out of Pocket Max:        Life Max:   CIR:        SNF:   Outpatient:       Co-Pay:   Home Health:        Co-Pay:   DME:       Co-Pay:    Emergency Contact Information Contact Information    Name Relation Home Work Mobile   [name],Charles Spouse [phone]     [place] Brother [phone]  [phone]     Current Medical History  Patient Admitting Diagnosis:Critical illness polyneuropathy   History of Present Illness: A 46 y.o. female with history of asthma, T2DM, chronic pain, HA; who was admitted on 02/18/17 with asthma exacerbation requiring intubation. History taken from chart review and multiple family members. She was treated with steroids and antibiotics with improvement and tolerated extubation on 6/5.  She developed AKI requiring brief CRRT/HD thorough 6/4 and is showing improvement in SCr and UOP.  She was started on regular  diet today and PT evaluation done revealing significant weakness BUE/BLE. CIR recommended for follow up therapy.    Past Medical History  Past Medical History:  Diagnosis Date  . Arthritis   . Asthma   . Chronic back pain   . Chronic chest pain   . Depression   . DM (diabetes mellitus) ([place])    INSULIN DEPENDENT  . GERD (gastroesophageal reflux disease)   . Headache(784.0)   . Hypertension   . Hypokalemia     Family History  family history includes Arthritis in her mother; Diabetes in her brother, mother, and sister; Heart attack in her mother; Hypertension in her mother and sister; Seizures in her brother; Stroke in her father and mother.  Prior Rehab/Hospitalizations: No  previous rehab.  Has the patient had major surgery during 100 days prior to admission? No  Current Medications   Current Facility-Administered Medications:  .  0.9 %  sodium chloride infusion, , Intravenous, Continuous, Raylene Miyamoto, MD, Stopped at 02/25/17 2130 .  0.9 %  sodium chloride infusion, , Intravenous, Continuous, Diallo, Abdoulaye, MD, Last Rate: 100 mL/hr at 02/27/17 0739, 100 mL at 02/27/17 0739 .  acetaminophen (TYLENOL) tablet 650 mg, 650 mg, Oral, Q4H PRN, Simonne Maffucci B, MD, 650 mg at 02/26/17 1536 .  albuterol (PROVENTIL) (2.5 MG/3ML) 0.083% nebulizer solution 2.5 mg, 2.5 mg, Nebulization, Q2H PRN, Simonne Maffucci B, MD, 10 mg at 02/19/17 1016 .  albuterol (PROVENTIL) (2.5 MG/3ML) 0.083% nebulizer solution 2.5 mg, 2.5 mg, Nebulization, Q6H, McQuaid, Douglas B, MD, 2.5 mg at 02/27/17 2703 .  [START ON 02/28/2017] amLODipine (NORVASC) tablet 5 mg, 5 mg, Oral, Daily, Shawna Orleans, Elsia J, DO .  arformoterol (BROVANA) nebulizer solution 15 mcg, 15 mcg, Nebulization, BID, Simonne Maffucci B, MD, 15 mcg at 02/27/17 5009 .  aspirin tablet 325 mg, 325 mg, Per Tube, Daily, Desai, Rahul P, PA-C, 325 mg at 02/27/17 0918 .  azithromycin (ZITHROMAX) tablet 500 mg, 500 mg, Oral, Daily, Hensel, Jamal Collin,  MD, 500 mg at 02/27/17 1007 .  budesonide (PULMICORT) nebulizer solution 0.5 mg, 0.5 mg, Nebulization, BID, McQuaid, Douglas B, MD, 0.5 mg at 02/27/17 3818 .  Chlorhexidine Gluconate Cloth 2 % PADS 6 each, 6 each, Topical, Daily, Raylene Miyamoto, MD, 6 each at 02/26/17 1000 .  enoxaparin (LOVENOX) injection 40 mg, 40 mg, Subcutaneous, Q24H, Simpson, Paula B, NP, 40 mg at 02/27/17 0918 .  feeding supplement (GLUCERNA SHAKE) (GLUCERNA SHAKE) liquid 237 mL, 237 mL, Oral, TID BM, Byrum, Rose Fillers, MD, 237 mL at 02/26/17 1000 .  fentaNYL (SUBLIMAZE) injection 12.5-25 mcg, 12.5-25 mcg, Intravenous, Q2H PRN, Simonne Maffucci B, MD, 25 mcg at 02/27/17 0916 .  gabapentin (NEURONTIN) tablet 300 mg, 300 mg, Oral, TID, Diallo, Abdoulaye, MD, 300 mg at 02/27/17 2993 .  hydrALAZINE (APRESOLINE) injection 10 mg, 10 mg, Intravenous, Q4H PRN, Raylene Miyamoto, MD, 10 mg at 02/26/17 0016 .  insulin aspart (novoLOG) injection 0-20 Units, 0-20 Units, Subcutaneous, Q4H, Mannam, Praveen, MD, 3 Units at 02/27/17 0821 .  MEDLINE mouth rinse, 15 mL, Mouth Rinse, BID, Byrum, Rose Fillers, MD, 15 mL at 02/27/17 1000 .  olopatadine (PATANOL) 0.1 % ophthalmic solution 1 drop, 1 drop, Right Eye, BID, Carlyle Dolly, MD, 1 drop at 02/27/17 [phone] .  pantoprazole (PROTONIX) EC tablet 40 mg, 40 mg, Oral, QHS, Simpson, Paula B, NP, 40 mg at 02/26/17 2157 .  phenol (CHLORASEPTIC) mouth spray 1 spray, 1 spray, Mouth/Throat, PRN, Diallo, Abdoulaye, MD .  polyethylene glycol (MIRALAX / GLYCOLAX) packet 17 g, 17 g, Oral, BID, Vickii Chafe Corine Shelter, MD, 17 g at 02/27/17 [phone] .  potassium chloride 20 MEQ/15ML (10%) solution 40 mEq, 40 mEq, Per Tube, Once, Desai, Rahul P, PA-C .  predniSONE (DELTASONE) tablet 50 mg, 50 mg, Oral, Q breakfast, Verner Mould, MD, 50 mg at 02/27/17 0746 .  senna-docusate (Senokot-S) tablet 1 tablet, 1 tablet, Oral, BID, Vickii Chafe Corine Shelter, MD, 1 tablet at 02/27/17 [phone] .  sertraline (ZOLOFT) tablet  50 mg, 50 mg, Oral, Daily, Jennelle Human B, NP, 50 mg at 02/27/17 0918 .  simvastatin (ZOCOR) tablet 10 mg, 10 mg, Oral, q1800, Jennelle Human B, NP, 10 mg at 02/26/17 1712 .  SUMAtriptan (IMITREX) tablet 100 mg,  100 mg, Oral, PRN, Diallo, Abdoulaye, MD  Patients Current Diet: Diet Carb Modified Fluid consistency: Thin; Room service appropriate? Yes  Precautions / Restrictions Precautions Precautions: Fall Precaution Comments: HR intermittently tachy this session Restrictions Weight Bearing Restrictions: No   Has the patient had 2 or more falls or a fall with injury in the past year?Yes.  Husband reports 2 falls with soreness and bruising.  Has right leg weakness premorbidly.  Prior Activity Level Limited Community (1-2x/wk): Went out 1-2 X a week.  Does not have a working vehicle at this time.  Home Assistive Devices / Equipment Home Assistive Devices/Equipment: Kasandra Knudsen (specify quad or straight) (spouse states that she does not use ) Home Equipment: Shower seat - built in, [employer] - tub/shower, Hand held shower head  Prior Device Use: Indicate devices/aids used by the patient prior to current illness, exacerbation or injury? None per husband.  Prior Functional Level Prior Function Level of Independence: Independent  Self Care: Did the patient need help bathing, dressing, using the toilet or eating?  Independent  Indoor Mobility: Did the patient need assistance with walking from room to room (with or without device)? Independent  Stairs: Did the patient need assistance with internal or external stairs (with or without device)? Independent  Functional Cognition: Did the patient need help planning regular tasks such as shopping or remembering to take medications? Independent  Current Functional Level Cognition  Overall Cognitive Status: Within Functional Limits for tasks assessed Orientation Level: Oriented X4 General Comments: SLow to respond but this seems to be baseline.     Extremity Assessment (includes Sensation/Coordination)  Upper Extremity Assessment: RUE deficits/detail, LUE deficits/detail RUE Deficits / Details: edematous, can barely lift arm off of arm rest of recliner, can use it to pull herself forward in recliner with use of arm rests RUE Coordination: decreased fine motor, decreased gross motor LUE Deficits / Details: edematous; grossly 2+/5 LUE Coordination: decreased fine motor, decreased gross motor  Lower Extremity Assessment: RLE deficits/detail, LLE deficits/detail RLE Deficits / Details: grossly 2-2+/5 LLE Deficits / Details: grossly 2-2+/5    ADLs  Overall ADL's : Needs assistance/impaired Eating/Feeding: Maximal assistance Eating/Feeding Details (indicate cue type and reason): supported sitting Grooming: Maximal assistance Grooming Details (indicate cue type and reason): supported sitting Upper Body Bathing: Maximal assistance Upper Body Bathing Details (indicate cue type and reason): supported sitting Lower Body Bathing: Total assistance Lower Body Bathing Details (indicate cue type and reason): Mod A +2 intially to min guard A +2 at end of session Upper Body Dressing : Total assistance Upper Body Dressing Details (indicate cue type and reason): supported sitting Lower Body Dressing: Total assistance Lower Body Dressing Details (indicate cue type and reason): Mod A +2 intially to min guard A +2 at end of session    Mobility  Overal bed mobility: Needs Assistance Bed Mobility: Supine to Sit, Sit to Supine Supine to sit: Total assist Sit to supine: +2 for physical assistance, Max assist General bed mobility comments: Pt up in recliner upon our arrival    Transfers  Overall transfer level: Needs assistance Equipment used: Rolling walker (2 wheeled) Transfers: Sit to/from Stand Sit to Stand: Min guard, Mod assist, Min assist General transfer comment: intially stand from recliner Mod A +2, second stand min A +2, 3rd stand min  guard A +2 with use of body momentum; stabilizing Right knee initially.    Ambulation / Gait / Stairs / Wheelchair Mobility  Ambulation/Gait Ambulation/Gait assistance: Min assist, +2 safety/equipment Ambulation Distance (  Feet): 3 Feet Assistive device: Rolling walker (2 wheeled) Gait Pattern/deviations: Step-to pattern, Decreased stride length, Decreased stance time - right, Trunk flexed General Gait Details: Able to take a few steps with assist for weight shifting and to stabilize right knee. Fatigues. HR up to 130s bpm. Gait velocity: decreased Gait velocity interpretation: Below normal speed for age/gender    Posture / Balance Dynamic Sitting Balance Sitting balance - Comments: Pt sat EOB x 15 minutes with mod to min guard assist. Pt initially light headed with sitting but BP without drop and 160's/80. SpO2 90% as fatigued on RA. Primarily needed assist due to weakness and fatigue more than true balance problem. Balance Overall balance assessment: Needs assistance Sitting-balance support: Feet supported, No upper extremity supported Sitting balance-[name]: Poor Sitting balance - Comments: Pt sat EOB x 15 minutes with mod to min guard assist. Pt initially light headed with sitting but BP without drop and 160's/80. SpO2 90% as fatigued on RA. Primarily needed assist due to weakness and fatigue more than true balance problem. Postural control: Right lateral lean, Left lateral lean Standing balance support: During functional activity Standing balance-[name]: Poor Standing balance comment: reliant on RW for support. Cues for upright posture. Able to stand with cues for upright posture.     Special needs/care consideration BiPAP/CPAP No CPM No Continuous Drip IV KVO Dialysis No        Life Vest No Oxygen No, but has nebulizers for asthma Special Bed No Trach Size No Wound Vac (area) No      Skin Feet are discolored with dry skin                             Bowel mgmt: Last  BM 02/26/17 Bladder mgmt: Voiding up on [facility] with assistance Diabetic mgmt Yes, on insulin here in hospital    Previous Home Environment Living Arrangements: Spouse/significant other, Children Available Help at Discharge: Family, Available 24 hours/day Type of Home: House Home Layout: One level Home Access: Stairs to enter Entrance Stairs-Rails: Right Entrance Stairs-Number of Steps: several Bathroom Shower/Tub: Gaffer, Architectural technologist: Standard Home Care Services: No  Discharge Living Setting Plans for Discharge Living Setting: Lives with (comment), Mobile Home (Lives with husband, children, nephew and his girlfriend.) Type of Home at Discharge: Mobile home Discharge Home Layout: One level Discharge Home Access: Stairs to enter Entrance Stairs-Number of Steps: 5-6 steps entry Does the patient have any problems obtaining your medications?: No  Social/Family/Support Systems Patient Roles: Spouse, Parent (Has a husband and 3 grown children.) Contact Information: [name] - spouse - [phone] Anticipated Caregiver: Spouse, children, nephew and extended family members Anticipated Caregiver's Contact Information: York Ram - brother - [phone] Ability/Limitations of Caregiver: Husband works.  Nephew and his girlfriend currently not working and can assist.  Children work varying jobs. Caregiver Availability: 24/7 Discharge Plan Discussed with Primary Caregiver: Yes Is Caregiver In Agreement with Plan?: Yes Does Caregiver/Family have Issues with Lodging/Transportation while Pt is in Rehab?: No  Goals/Additional Needs Patient/Family Goal for Rehab: PT/OT min to mod assist, SLP supervision goals Expected length of stay: 20-25 days Cultural Considerations: Christian Dietary Needs: Carb mod, med cal, thin liquids Equipment Needs: TBD Pt/Family Agrees to Admission and willing to participate: Yes Program Orientation Provided & Reviewed with  Pt/Caregiver Including Roles  & Responsibilities: Yes  Decrease burden of Care through IP rehab admission: N/A  Possible need for SNF placement upon discharge:  No, patient is refusing.  Patient Condition: This patient's medical and functional status has changed since the consult dated: 02/25/17 in which the Rehabilitation Physician determined and documented that the patient's condition is appropriate for intensive rehabilitative care in an inpatient rehabilitation facility. See "History of Present Illness" (above) for medical update. Functional changes are: Currently requiring min to mod assist for transfers and ambulated 3 feet RW. Patient's medical and functional status update has been discussed with the Rehabilitation physician and patient remains appropriate for inpatient rehabilitation. Will admit to inpatient rehab today.  Preadmission Screen Completed By:  Retta Diones, 02/27/2017 12:27 PM ______________________________________________________________________   Discussed status with Dr. Naaman Plummer on 02/27/17 at 1217 and received telephone approval for admission today.  Admission Coordinator:  Retta Diones, time 1227/Date 02/27/17

## 2017-02-26 NOTE — Progress Notes (Signed)
Family Medicine Teaching Service Daily Progress Note Intern Pager: [phone]  Patient name: [name] Medical record number: [id number] Date of birth: Apr 14, 1971 Age: 46 y.o. Gender: female  Primary Care Provider: Arnoldo Morale, MD Consultants: CCM, Nephrology Code Status: Full   Pt Overview and Major Events to Date:  5/30 admitted for asthma exacerbation and intubated 5/31 AKI CRRT/HD 6/5 Extubated 6/6 Transferred out ICU  Assessment and Plan: Patient is a 46 y.o. female with a past medical history significant for asthma, chronic back pain, hyperlipidemia, hypertension, T2DM, obstructive sleep apnea, depression who was admitted on 5/30 with asthma exacerbation requiring intubation and ICU admission after failing conservative management.  #Asthma exacerbation, improving Patient is on RA and maintaining good oxygen saturation. Patient is currently on antibiotics for possible HCAP though CXR was equivocal for infiltrates vs atelectasis after sputum cultures grew Moraxella Catarrhallis. Patient this morning had mild wheezing at the base but did not appear to have increase work of breathing. Patient with positive rhinovirus on 5/31. --Will continue Brovana neb 15 mcg bid  --Will start prednisone 50 mg daily for 5 days DAY 1 (6/7) --O2 as needed  --Continue Azithromycin 500 mg daily --Continue Albuterol q2 prn --Continue albuterol 2.5 nebulizer  --Patient will need counseling on smoking cessation  #Chest pain, stable, improved EKG on admission was not concerning given increased work of breathing and use of accessory muscles during acute exacerbation. Likely demand ischemia with trop initially <0.03 rising to a max of 0.75. Now down trending. --Will continue to monitor symptoms --Trend troponin as needed --Cardiology consult as needed.  #AKI, acute, resolving Patient kidney function continue to improve. This morning creatinine is 1.17. Patient briefly received CCRT/HD  for oliguria and worsening kidney function with a creatinine of 2.16. Patient is now making urine. --Will follow up on daily Renal Function Panel --Encourage po intake   --Consult nephrology as needed  #T2DM, fairly well controlled A1c on admission (5/31) was 7.4 up from 6.5 on (2/26). Blood glucose Have been mildly elevated in the setting of steroid course. --Will continue resistant SSI regimen  #Hypertension, uncontrolled  Patient BP have been labile and required home med and PRN. Patient's amlodipine was increased from 5 mg to 10 mg. Patient required hydralazine overnight. This morning BP is 159/97. --Will continue amlodipine 10 mg daily --Will consider restarting Losartan giving improving kidney function  --Continue hydralazine PRN  #GERD Patient was intubated for about a week. --Continue pantoprazole 40 mg daily   #HLD  --Continue simvastatin 10 mg daily  #Constipation --Continue Miralax 17g bid  --Continue Senna-Docusate 1 tab bid  #Depression --Will continue Zoloft 50 mg daily   #Migraines, chronic --Will continue Topiramate 100 mg bid   #Smoking cessation Patient is interested in quitting after discharge    FEN/GI: Carb modified diet PPx: Lovenox  Disposition: Patient has been evaluated by CIR and is a candidate, will be transferred when a bed is available. Patient is currently stable and improving.  Subjective:  Patient is sitting in the chair feeling slightly better but still weak. Patient is tolerating po and will be working with PT. Patient reports that hoarseness has improved overnight.   Objective: Temp:  [98.7 F (37.1 C)-99.2 F (37.3 C)] 98.9 F (37.2 C) (06/07 0356) Pulse Rate:  [75-96] 91 (06/07 0356) Resp:  [15-23] 18 (06/07 0356) BP: (143-177)/(69-90) 146/85 (06/07 0356) SpO2:  [93 %-100 %] 96 % (06/07 0356) Weight:  [234 lb 5.6 oz (106.3 kg)-236 lb 12.4 oz (107.4 kg)] 236 lb  12.4 oz (107.4 kg) (06/07 0530)  Physical Exam:  General: Tired  appearing woman, in NAD, able to participate in exam, patient stil with some hoarseness. Cardiac: RRR, normal heart sounds, no murmurs. 2+ radial and PT pulses bilaterally Respiratory: Mild expiratory wheezing at the lungs bases bilaterally, but good air movement and no increase work of breathing. Abdomen: soft, nontender, nondistended, no hepatic or splenomegaly, +BS Extremities: no edema or cyanosis. WWP. Skin: warm and dry, no rashes noted Neuro: alert and oriented x4, no focal deficits Psych: Normal affect and mood   Laboratory:  Recent Labs Lab 02/24/17 0324 02/25/17 0235 02/26/17 0710  WBC 18.7* 19.8* 18.5*  HGB 10.8* 11.4* 12.3  HCT 33.8* 34.3* 37.0  PLT 251 272 316    Recent Labs Lab 02/24/17 0324 02/25/17 0235 02/26/17 0450  NA 141 136 134*  K 3.7 4.7 4.2  CL 104 104 104  CO2 27 20* 19*  BUN 54* 45* 34*  CREATININE 1.41* 1.25* 1.17*  CALCIUM 8.6* 8.8* 8.8*  GLUCOSE 126* 174* 137*    Imaging/Diagnostic Tests: Dg Chest Port 1 View  Result Date: 02/19/2017 CLINICAL DATA:  Status post central line placement EXAM: PORTABLE CHEST 1 VIEW COMPARISON:  02/19/2017 FINDINGS: Cardiac shadow remains enlarged. Endotracheal tube and nasogastric catheter are noted in satisfactory position. A right jugular central line is noted in the mid superior vena cava. No pneumothorax is seen. The overall inspiratory effort is poor with crowding vascular markings. No focal infiltrate is seen. IMPRESSION: No pneumothorax following central line placement. Electronically Signed   By: Inez Catalina M.D.   On: 02/19/2017 14:47   Dg Chest Port 1 View  Result Date: 02/19/2017 CLINICAL DATA:  Acute onset of shortness of breath. Endotracheal tube placement. Initial encounter. EXAM: PORTABLE CHEST 1 VIEW COMPARISON:  Chest radiograph performed 02/18/2017 FINDINGS: The patient's endotracheal tube is seen ending 3 cm above the carina. An enteric tube is noted extending below the diaphragm. Vascular  congestion is noted. Mild left-sided airspace opacities may reflect pneumonia or mild asymmetric interstitial edema. No pleural effusion or pneumothorax is seen. The cardiomediastinal silhouette is mildly enlarged. No acute osseous abnormalities are identified. IMPRESSION: 1. Endotracheal tube seen ending 3 cm above the carina. 2. Vascular congestion and mild cardiomegaly. Mild left-sided airspace opacities may reflect pneumonia or mild asymmetric interstitial edema. Electronically Signed   By: Garald Balding M.D.   On: 02/19/2017 01:33   Dg Chest Port 1 View  Result Date: 02/18/2017 CLINICAL DATA:  Shortness of breath and cough for 2 days EXAM: PORTABLE CHEST 1 VIEW COMPARISON:  08/22/2015 FINDINGS: The heart size and mediastinal contours are within normal limits. Both lungs are clear. The visualized skeletal structures are unremarkable. IMPRESSION: No active disease. Electronically Signed   By: Inez Catalina M.D.   On: 02/18/2017 16:36   Marjie Skiff, MD 02/26/2017, 9:33 AM PGY-1, [place] Intern pager: [phone], text pages welcome

## 2017-02-26 NOTE — Care Management Note (Addendum)
Case Management Note  Patient Details  Name: [name] MRN: [id number] Date of Birth: 1971-01-04  Subjective/Objective:                    Action/Plan:  PTA independent  from home with husband  Currently ventilated with paralytic on CRRT   Expected Discharge Date:                  Expected Discharge Plan:  Home/Self Care  In-House Referral:     Discharge planning Services  CM Consult  Post Acute Care Choice:    Choice offered to:     DME Arranged:    DME Agency:     HH Arranged:    HH Agency:     Status of Service:     If discussed at [employer], dates discussed:    Additional Comments: CM informed by CIR that pt has yet to demonstrate capability for aggressive rehab - agency will continue to follow.  Pt remains on IV solumedrol and IV PRN BP meds  Pt is now extubated.  CIR recommended and following.  CSW consulted for back up plan for CIR on 6/7 Maryclare [place], [place] 02/26/2017, 9:28 AM

## 2017-02-27 ENCOUNTER — Encounter (HOSPITAL_COMMUNITY): Payer: Self-pay | Admitting: *Deleted

## 2017-02-27 ENCOUNTER — Inpatient Hospital Stay (HOSPITAL_COMMUNITY): Payer: Medicare Other

## 2017-02-27 ENCOUNTER — Inpatient Hospital Stay (HOSPITAL_COMMUNITY)
Admission: RE | Admit: 2017-02-27 | Discharge: 2017-03-04 | DRG: 073 | Disposition: A | Discharge status: Other Acute Inpatient Hospital | Payer: Medicare Other | Source: Intra-hospital | Attending: Physical Medicine & Rehabilitation | Admitting: Physical Medicine & Rehabilitation

## 2017-02-27 DIAGNOSIS — F419 Anxiety disorder, unspecified: Secondary | ICD-10-CM | POA: Diagnosis present

## 2017-02-27 DIAGNOSIS — Z8249 Family history of ischemic heart disease and other diseases of the circulatory system: Secondary | ICD-10-CM

## 2017-02-27 DIAGNOSIS — D62 Acute posthemorrhagic anemia: Secondary | ICD-10-CM

## 2017-02-27 DIAGNOSIS — Z09 Encounter for follow-up examination after completed treatment for conditions other than malignant neoplasm: Secondary | ICD-10-CM

## 2017-02-27 DIAGNOSIS — Y95 Nosocomial condition: Secondary | ICD-10-CM | POA: Diagnosis not present

## 2017-02-27 DIAGNOSIS — T380X5A Adverse effect of glucocorticoids and synthetic analogues, initial encounter: Secondary | ICD-10-CM | POA: Diagnosis present

## 2017-02-27 DIAGNOSIS — G43909 Migraine, unspecified, not intractable, without status migrainosus: Secondary | ICD-10-CM | POA: Diagnosis present

## 2017-02-27 DIAGNOSIS — K219 Gastro-esophageal reflux disease without esophagitis: Secondary | ICD-10-CM | POA: Diagnosis present

## 2017-02-27 DIAGNOSIS — I10 Essential (primary) hypertension: Secondary | ICD-10-CM | POA: Diagnosis not present

## 2017-02-27 DIAGNOSIS — E8809 Other disorders of plasma-protein metabolism, not elsewhere classified: Secondary | ICD-10-CM | POA: Diagnosis present

## 2017-02-27 DIAGNOSIS — I248 Other forms of acute ischemic heart disease: Secondary | ICD-10-CM | POA: Diagnosis present

## 2017-02-27 DIAGNOSIS — Z6841 Body Mass Index (BMI) 40.0 and over, adult: Secondary | ICD-10-CM | POA: Diagnosis not present

## 2017-02-27 DIAGNOSIS — J386 Stenosis of larynx: Secondary | ICD-10-CM

## 2017-02-27 DIAGNOSIS — E46 Unspecified protein-calorie malnutrition: Secondary | ICD-10-CM | POA: Diagnosis not present

## 2017-02-27 DIAGNOSIS — S1983XA Other specified injuries of vocal cord, initial encounter: Secondary | ICD-10-CM | POA: Diagnosis not present

## 2017-02-27 DIAGNOSIS — Z888 Allergy status to other drugs, medicaments and biological substances status: Secondary | ICD-10-CM | POA: Diagnosis not present

## 2017-02-27 DIAGNOSIS — R042 Hemoptysis: Secondary | ICD-10-CM | POA: Diagnosis not present

## 2017-02-27 DIAGNOSIS — G7281 Critical illness myopathy: Secondary | ICD-10-CM | POA: Diagnosis present

## 2017-02-27 DIAGNOSIS — R0602 Shortness of breath: Secondary | ICD-10-CM

## 2017-02-27 DIAGNOSIS — J189 Pneumonia, unspecified organism: Secondary | ICD-10-CM | POA: Diagnosis not present

## 2017-02-27 DIAGNOSIS — Z833 Family history of diabetes mellitus: Secondary | ICD-10-CM

## 2017-02-27 DIAGNOSIS — E119 Type 2 diabetes mellitus without complications: Secondary | ICD-10-CM | POA: Diagnosis not present

## 2017-02-27 DIAGNOSIS — F17210 Nicotine dependence, cigarettes, uncomplicated: Secondary | ICD-10-CM | POA: Diagnosis not present

## 2017-02-27 DIAGNOSIS — E669 Obesity, unspecified: Secondary | ICD-10-CM | POA: Diagnosis not present

## 2017-02-27 DIAGNOSIS — F411 Generalized anxiety disorder: Secondary | ICD-10-CM | POA: Diagnosis not present

## 2017-02-27 DIAGNOSIS — E876 Hypokalemia: Secondary | ICD-10-CM | POA: Diagnosis present

## 2017-02-27 DIAGNOSIS — G8929 Other chronic pain: Secondary | ICD-10-CM | POA: Diagnosis present

## 2017-02-27 DIAGNOSIS — G6281 Critical illness polyneuropathy: Secondary | ICD-10-CM | POA: Diagnosis not present

## 2017-02-27 DIAGNOSIS — R0609 Other forms of dyspnea: Secondary | ICD-10-CM | POA: Diagnosis not present

## 2017-02-27 DIAGNOSIS — Z7982 Long term (current) use of aspirin: Secondary | ICD-10-CM

## 2017-02-27 DIAGNOSIS — J398 Other specified diseases of upper respiratory tract: Secondary | ICD-10-CM | POA: Diagnosis not present

## 2017-02-27 DIAGNOSIS — T17208A Unspecified foreign body in pharynx causing other injury, initial encounter: Secondary | ICD-10-CM | POA: Diagnosis not present

## 2017-02-27 DIAGNOSIS — M542 Cervicalgia: Secondary | ICD-10-CM | POA: Diagnosis not present

## 2017-02-27 DIAGNOSIS — Z791 Long term (current) use of non-steroidal anti-inflammatories (NSAID): Secondary | ICD-10-CM | POA: Diagnosis not present

## 2017-02-27 DIAGNOSIS — D72829 Elevated white blood cell count, unspecified: Secondary | ICD-10-CM | POA: Diagnosis not present

## 2017-02-27 DIAGNOSIS — E1169 Type 2 diabetes mellitus with other specified complication: Secondary | ICD-10-CM | POA: Diagnosis not present

## 2017-02-27 DIAGNOSIS — Z7289 Other problems related to lifestyle: Secondary | ICD-10-CM | POA: Diagnosis not present

## 2017-02-27 DIAGNOSIS — Z79899 Other long term (current) drug therapy: Secondary | ICD-10-CM

## 2017-02-27 DIAGNOSIS — J342 Deviated nasal septum: Secondary | ICD-10-CM | POA: Diagnosis not present

## 2017-02-27 DIAGNOSIS — R490 Dysphonia: Secondary | ICD-10-CM | POA: Diagnosis not present

## 2017-02-27 DIAGNOSIS — T17508A Unspecified foreign body in bronchus causing other injury, initial encounter: Secondary | ICD-10-CM

## 2017-02-27 DIAGNOSIS — N179 Acute kidney failure, unspecified: Secondary | ICD-10-CM | POA: Diagnosis not present

## 2017-02-27 DIAGNOSIS — R061 Stridor: Secondary | ICD-10-CM | POA: Diagnosis not present

## 2017-02-27 DIAGNOSIS — E1142 Type 2 diabetes mellitus with diabetic polyneuropathy: Secondary | ICD-10-CM | POA: Diagnosis not present

## 2017-02-27 DIAGNOSIS — J45901 Unspecified asthma with (acute) exacerbation: Secondary | ICD-10-CM | POA: Diagnosis not present

## 2017-02-27 LAB — GLUCOSE, CAPILLARY
GLUCOSE-CAPILLARY: 227 mg/dL — AB (ref 65–99)
Glucose-Capillary: 121 mg/dL — ABNORMAL HIGH (ref 65–99)
Glucose-Capillary: 125 mg/dL — ABNORMAL HIGH (ref 65–99)
Glucose-Capillary: 175 mg/dL — ABNORMAL HIGH (ref 65–99)
Glucose-Capillary: 185 mg/dL — ABNORMAL HIGH (ref 65–99)
Glucose-Capillary: 192 mg/dL — ABNORMAL HIGH (ref 65–99)
Glucose-Capillary: 76 mg/dL (ref 65–99)

## 2017-02-27 LAB — URINALYSIS, COMPLETE (UACMP) WITH MICROSCOPIC
BILIRUBIN URINE: NEGATIVE
Glucose, UA: >=500 mg/dL — AB
HGB URINE DIPSTICK: NEGATIVE
KETONES UR: NEGATIVE mg/dL
LEUKOCYTES UA: NEGATIVE
NITRITE: NEGATIVE
PH: 6 (ref 5.0–8.0)
Protein, ur: NEGATIVE mg/dL
SPECIFIC GRAVITY, URINE: 1.008 (ref 1.005–1.030)

## 2017-02-27 LAB — RENAL FUNCTION PANEL
ANION GAP: 10 (ref 5–15)
Albumin: 2.4 g/dL — ABNORMAL LOW (ref 3.5–5.0)
BUN: 35 mg/dL — AB (ref 6–20)
CHLORIDE: 102 mmol/L (ref 101–111)
CO2: 23 mmol/L (ref 22–32)
Calcium: 8.7 mg/dL — ABNORMAL LOW (ref 8.9–10.3)
Creatinine, Ser: 1.38 mg/dL — ABNORMAL HIGH (ref 0.44–1.00)
GFR calc Af Amer: 52 mL/min — ABNORMAL LOW (ref >60)
GFR, EST NON AFRICAN AMERICAN: 45 mL/min — AB (ref >60)
GLUCOSE: 184 mg/dL — AB (ref 65–99)
PHOSPHORUS: 5.5 mg/dL — AB (ref 2.5–4.6)
POTASSIUM: 3.6 mmol/L (ref 3.5–5.1)
Sodium: 135 mmol/L (ref 135–145)

## 2017-02-27 LAB — CBC
HEMATOCRIT: 33.5 % — AB (ref 36.0–46.0)
Hemoglobin: 11.1 g/dL — ABNORMAL LOW (ref 12.0–15.0)
MCH: 28.6 pg (ref 26.0–34.0)
MCHC: 33.1 g/dL (ref 30.0–36.0)
MCV: 86.3 fL (ref 78.0–100.0)
Platelets: 343 10*3/uL (ref 150–400)
RBC: 3.88 MIL/uL (ref 3.87–5.11)
RDW: 14.5 % (ref 11.5–15.5)
WBC: 17.8 10*3/uL — AB (ref 4.0–10.5)

## 2017-02-27 LAB — CULTURE, BLOOD (ROUTINE X 2)
Culture: NO GROWTH
SPECIAL REQUESTS: ADEQUATE

## 2017-02-27 MED ORDER — SUMATRIPTAN SUCCINATE 100 MG PO TABS
100.0000 mg | ORAL_TABLET | Freq: Once | ORAL | Status: AC
  Administered 2017-02-27: 100 mg via ORAL
  Filled 2017-02-27: qty 1

## 2017-02-27 MED ORDER — ALBUTEROL SULFATE (2.5 MG/3ML) 0.083% IN NEBU: 2.5000 mg | INHALATION_SOLUTION | RESPIRATORY_TRACT | Status: DC | PRN

## 2017-02-27 MED ORDER — PANTOPRAZOLE SODIUM 40 MG PO TBEC
40.0000 mg | DELAYED_RELEASE_TABLET | Freq: Every day | ORAL | Status: DC
  Administered 2017-02-28: 40 mg via ORAL
  Filled 2017-02-27: qty 1

## 2017-02-27 MED ORDER — DIPHENHYDRAMINE HCL 12.5 MG/5ML PO ELIX
12.5000 mg | ORAL_SOLUTION | Freq: Four times a day (QID) | ORAL | Status: DC | PRN
  Administered 2017-03-01 (×2): 25 mg via ORAL
  Filled 2017-02-27 (×2): qty 10

## 2017-02-27 MED ORDER — GABAPENTIN 300 MG PO CAPS
300.0000 mg | ORAL_CAPSULE | Freq: Three times a day (TID) | ORAL | Status: DC
  Administered 2017-02-27 – 2017-03-03 (×14): 300 mg via ORAL
  Filled 2017-02-27 (×14): qty 1

## 2017-02-27 MED ORDER — PROCHLORPERAZINE MALEATE 5 MG PO TABS: 5.0000 mg | ORAL_TABLET | Freq: Four times a day (QID) | ORAL | Status: DC | PRN

## 2017-02-27 MED ORDER — ACETAMINOPHEN 325 MG PO TABS
325.0000 mg | ORAL_TABLET | ORAL | Status: DC | PRN
  Administered 2017-03-02 – 2017-03-03 (×2): 650 mg via ORAL
  Filled 2017-02-27 (×2): qty 2

## 2017-02-27 MED ORDER — GUAIFENESIN-DM 100-10 MG/5ML PO SYRP
5.0000 mL | ORAL_SOLUTION | Freq: Four times a day (QID) | ORAL | Status: DC | PRN
  Administered 2017-02-27: 5 mL via ORAL
  Administered 2017-02-28 – 2017-03-02 (×5): 10 mL via ORAL
  Filled 2017-02-27 (×6): qty 10

## 2017-02-27 MED ORDER — BUDESONIDE 0.5 MG/2ML IN SUSP: 0.5000 mg | Freq: Two times a day (BID) | RESPIRATORY_TRACT | 12 refills | Status: DC

## 2017-02-27 MED ORDER — OLOPATADINE HCL 0.1 % OP SOLN
1.0000 [drp] | Freq: Two times a day (BID) | OPHTHALMIC | Status: DC
  Administered 2017-02-27 – 2017-03-03 (×9): 1 [drp] via OPHTHALMIC
  Filled 2017-02-27: qty 5

## 2017-02-27 MED ORDER — AMLODIPINE BESYLATE 5 MG PO TABS: 5.0000 mg | ORAL_TABLET | Freq: Every day | ORAL | Status: DC

## 2017-02-27 MED ORDER — REPAGLINIDE 2 MG PO TABS
4.0000 mg | ORAL_TABLET | Freq: Every day | ORAL | Status: DC
Start: 2017-02-27 — End: 2017-03-04
  Administered 2017-02-27 – 2017-03-03 (×5): 4 mg via ORAL
  Filled 2017-02-27 (×5): qty 2

## 2017-02-27 MED ORDER — ENOXAPARIN SODIUM 40 MG/0.4ML ~~LOC~~ SOLN
40.0000 mg | SUBCUTANEOUS | Status: DC
  Administered 2017-02-28 – 2017-03-03 (×4): 40 mg via SUBCUTANEOUS
  Filled 2017-02-27 (×4): qty 0.4

## 2017-02-27 MED ORDER — SUCRALFATE 1 GM/10ML PO SUSP
1.0000 g | Freq: Three times a day (TID) | ORAL | Status: DC
  Administered 2017-02-27 – 2017-03-03 (×17): 1 g via ORAL
  Filled 2017-02-27 (×18): qty 10

## 2017-02-27 MED ORDER — SIMVASTATIN 5 MG PO TABS
10.0000 mg | ORAL_TABLET | Freq: Every day | ORAL | Status: DC
  Administered 2017-02-27 – 2017-03-03 (×5): 10 mg via ORAL
  Filled 2017-02-27 (×5): qty 2

## 2017-02-27 MED ORDER — AZITHROMYCIN 500 MG PO TABS
500.0000 mg | ORAL_TABLET | Freq: Every day | ORAL | Status: DC
  Administered 2017-02-27: 500 mg via ORAL
  Filled 2017-02-27: qty 1

## 2017-02-27 MED ORDER — PROCHLORPERAZINE EDISYLATE 5 MG/ML IJ SOLN: 5.0000 mg | Freq: Four times a day (QID) | INTRAMUSCULAR | Status: DC | PRN

## 2017-02-27 MED ORDER — AZITHROMYCIN 500 MG PO TABS
500.0000 mg | ORAL_TABLET | Freq: Once | ORAL | Status: AC
  Administered 2017-02-28: 500 mg via ORAL
  Filled 2017-02-27: qty 1

## 2017-02-27 MED ORDER — FLEET ENEMA 7-19 GM/118ML RE ENEM: 1.0000 | ENEMA | Freq: Once | RECTAL | Status: DC | PRN

## 2017-02-27 MED ORDER — PROCHLORPERAZINE 25 MG RE SUPP: 12.5000 mg | Freq: Four times a day (QID) | RECTAL | Status: DC | PRN

## 2017-02-27 MED ORDER — REPAGLINIDE 2 MG PO TABS
2.0000 mg | ORAL_TABLET | Freq: Two times a day (BID) | ORAL | Status: DC
  Administered 2017-02-28 – 2017-03-03 (×7): 2 mg via ORAL
  Filled 2017-02-27 (×10): qty 1

## 2017-02-27 MED ORDER — POLYETHYLENE GLYCOL 3350 17 G PO PACK: 17.0000 g | PACK | Freq: Every day | ORAL | Status: DC | PRN

## 2017-02-27 MED ORDER — AMLODIPINE BESYLATE 10 MG PO TABS
10.0000 mg | ORAL_TABLET | Freq: Every day | ORAL | Status: DC
  Administered 2017-02-28 – 2017-03-04 (×5): 10 mg via ORAL
  Filled 2017-02-27 (×5): qty 1

## 2017-02-27 MED ORDER — PHENOL 1.4 % MT LIQD
1.0000 | OROMUCOSAL | Status: DC | PRN
  Filled 2017-02-27: qty 177

## 2017-02-27 MED ORDER — AZITHROMYCIN 500 MG PO TABS: 500.0000 mg | ORAL_TABLET | Freq: Once | ORAL | Status: DC

## 2017-02-27 MED ORDER — SODIUM CHLORIDE 0.9 % IV SOLN
INTRAVENOUS | Status: DC
  Administered 2017-02-27: 100 mL via INTRAVENOUS

## 2017-02-27 MED ORDER — ALBUTEROL SULFATE (2.5 MG/3ML) 0.083% IN NEBU: 2.5000 mg | INHALATION_SOLUTION | Freq: Four times a day (QID) | RESPIRATORY_TRACT | Status: DC | PRN

## 2017-02-27 MED ORDER — ARFORMOTEROL TARTRATE 15 MCG/2ML IN NEBU: 15.0000 ug | INHALATION_SOLUTION | Freq: Two times a day (BID) | RESPIRATORY_TRACT | 0 refills | Status: DC

## 2017-02-27 MED ORDER — INSULIN ASPART 100 UNIT/ML ~~LOC~~ SOLN
0.0000 [IU] | Freq: Every day | SUBCUTANEOUS | Status: DC
  Administered 2017-03-01: 2 [IU] via SUBCUTANEOUS

## 2017-02-27 MED ORDER — MAGIC MOUTHWASH
5.0000 mL | Freq: Four times a day (QID) | ORAL | Status: DC | PRN
  Administered 2017-02-27: 5 mL via ORAL
  Filled 2017-02-27 (×2): qty 5

## 2017-02-27 MED ORDER — BISACODYL 10 MG RE SUPP: 10.0000 mg | Freq: Every day | RECTAL | Status: DC | PRN

## 2017-02-27 MED ORDER — DULAGLUTIDE 1.5 MG/0.5ML ~~LOC~~ SOAJ
1.5000 mg | SUBCUTANEOUS | Status: DC
  Administered 2017-02-28: 1.5 mg via SUBCUTANEOUS
  Filled 2017-02-27: qty 1

## 2017-02-27 MED ORDER — ASPIRIN EC 325 MG PO TBEC
325.0000 mg | DELAYED_RELEASE_TABLET | Freq: Every day | ORAL | Status: DC
  Administered 2017-02-28 – 2017-03-03 (×4): 325 mg via ORAL
  Filled 2017-02-27 (×4): qty 1

## 2017-02-27 MED ORDER — SERTRALINE HCL 50 MG PO TABS
50.0000 mg | ORAL_TABLET | Freq: Every day | ORAL | Status: DC
  Administered 2017-02-28 – 2017-03-03 (×4): 50 mg via ORAL
  Filled 2017-02-27 (×4): qty 1

## 2017-02-27 MED ORDER — AZITHROMYCIN 250 MG PO TABS
250.0000 mg | ORAL_TABLET | Freq: Every day | ORAL | Status: DC
Start: 2017-02-28 — End: 2017-02-27

## 2017-02-27 MED ORDER — ALBUTEROL SULFATE (2.5 MG/3ML) 0.083% IN NEBU
2.5000 mg | INHALATION_SOLUTION | Freq: Four times a day (QID) | RESPIRATORY_TRACT | Status: DC
  Administered 2017-02-27 (×2): 2.5 mg via RESPIRATORY_TRACT
  Filled 2017-02-27 (×2): qty 3

## 2017-02-27 MED ORDER — INSULIN ASPART 100 UNIT/ML ~~LOC~~ SOLN
0.0000 [IU] | Freq: Three times a day (TID) | SUBCUTANEOUS | Status: DC
Start: 2017-02-27 — End: 2017-03-04
  Administered 2017-02-27: 4 [IU] via SUBCUTANEOUS
  Administered 2017-02-28: 7 [IU] via SUBCUTANEOUS
  Administered 2017-02-28: 3 [IU] via SUBCUTANEOUS
  Administered 2017-02-28: 7 [IU] via SUBCUTANEOUS
  Administered 2017-03-01 (×2): 4 [IU] via SUBCUTANEOUS
  Administered 2017-03-02: 3 [IU] via SUBCUTANEOUS
  Administered 2017-03-02: 11 [IU] via SUBCUTANEOUS
  Administered 2017-03-03: 3 [IU] via SUBCUTANEOUS

## 2017-02-27 MED ORDER — METHOCARBAMOL 500 MG PO TABS
500.0000 mg | ORAL_TABLET | Freq: Three times a day (TID) | ORAL | Status: DC | PRN
  Administered 2017-03-01 – 2017-03-03 (×2): 500 mg via ORAL
  Filled 2017-02-27 (×2): qty 1

## 2017-02-27 MED ORDER — PREDNISONE 20 MG PO TABS
50.0000 mg | ORAL_TABLET | Freq: Every day | ORAL | Status: AC
  Administered 2017-02-28 – 2017-03-02 (×3): 50 mg via ORAL
  Filled 2017-02-27 (×3): qty 2

## 2017-02-27 MED ORDER — REPAGLINIDE 2 MG PO TABS: 2.0000 mg | ORAL_TABLET | ORAL | Status: DC

## 2017-02-27 MED ORDER — ALUM & MAG HYDROXIDE-SIMETH 200-200-20 MG/5ML PO SUSP: 30.0000 mL | ORAL | Status: DC | PRN

## 2017-02-27 MED ORDER — SUMATRIPTAN SUCCINATE 100 MG PO TABS
100.0000 mg | ORAL_TABLET | ORAL | Status: DC | PRN
  Filled 2017-02-27: qty 1

## 2017-02-27 MED ORDER — OXYCODONE HCL 5 MG PO TABS
5.0000 mg | ORAL_TABLET | ORAL | Status: DC | PRN
  Administered 2017-02-27: 5 mg via ORAL
  Filled 2017-02-27: qty 1

## 2017-02-27 MED ORDER — INSULIN ASPART 100 UNIT/ML ~~LOC~~ SOLN
6.0000 [IU] | Freq: Three times a day (TID) | SUBCUTANEOUS | Status: DC
  Administered 2017-02-27 – 2017-03-02 (×8): 6 [IU] via SUBCUTANEOUS

## 2017-02-27 MED ORDER — ARFORMOTEROL TARTRATE 15 MCG/2ML IN NEBU
15.0000 ug | INHALATION_SOLUTION | Freq: Two times a day (BID) | RESPIRATORY_TRACT | Status: DC
  Administered 2017-02-27 – 2017-03-04 (×8): 15 ug via RESPIRATORY_TRACT
  Filled 2017-02-27 (×11): qty 2

## 2017-02-27 MED ORDER — BUDESONIDE 0.5 MG/2ML IN SUSP
0.5000 mg | Freq: Two times a day (BID) | RESPIRATORY_TRACT | Status: DC
  Administered 2017-02-27 – 2017-03-04 (×9): 0.5 mg via RESPIRATORY_TRACT
  Filled 2017-02-27 (×12): qty 2

## 2017-02-27 MED ORDER — TRAZODONE HCL 50 MG PO TABS
25.0000 mg | ORAL_TABLET | Freq: Every evening | ORAL | Status: DC | PRN
  Administered 2017-02-27: 50 mg via ORAL
  Filled 2017-02-27: qty 1

## 2017-02-27 MED ORDER — GABAPENTIN 600 MG PO TABS
300.0000 mg | ORAL_TABLET | Freq: Three times a day (TID) | ORAL | Status: DC
  Administered 2017-02-27: 300 mg via ORAL
  Filled 2017-02-27: qty 1

## 2017-02-27 MED ORDER — HYDROCODONE-ACETAMINOPHEN 5-325 MG PO TABS
1.0000 | ORAL_TABLET | Freq: Four times a day (QID) | ORAL | Status: DC | PRN
  Administered 2017-02-27 – 2017-03-02 (×7): 1 via ORAL
  Filled 2017-02-27 (×7): qty 1

## 2017-02-27 MED ORDER — PREDNISONE 50 MG PO TABS: ORAL_TABLET | ORAL | 0 refills | Status: DC

## 2017-02-27 NOTE — Clinical Social Work Note (Signed)
CSW consutled for "As back up plan for CIR;Skilled nursing facility placement." CSW confirmed with Genie in CIR admissions that pt is appropriate for CIR and will be admitted once medically ready. Genie states MD will inform after rounds today if pt medically stable to discharge to CIR today.   CSW signing off as no further social work needs identified. Please reconsult if new social work needs arise.   Oretha Ellis, [place], [place] Work [phone]

## 2017-02-27 NOTE — Progress Notes (Signed)
1400 report received from [facility] from [phone]. Received to Room 3. Husband and pt oriented to room and safety precautions. Admission records started

## 2017-02-27 NOTE — Progress Notes (Signed)
Rehab admissions - Patient has been medically cleared for acute inpatient rehab admission for today.  Bed available and will admit today.  Call me for questions.  #[phone]

## 2017-02-27 NOTE — Progress Notes (Signed)
Family Medicine Teaching Service Daily Progress Note Intern Pager: [phone]  Patient name: [name] Medical record number: [id number] Date of birth: [birth year] Age: 46 y.o. Gender: female  Primary Care Provider: Arnoldo Morale, MD Consultants: CCM, Nephrology Code Status: Full   Pt Overview and Major Events to Date:  5/30 admitted for asthma exacerbation and intubated 5/31 AKI CRRT/HD 6/5 Extubated 6/6 Transferred out ICU  Assessment and Plan: Patient is a 46 y.o. female with a past medical history significant for asthma, chronic back pain, hyperlipidemia, hypertension, T2DM, obstructive sleep apnea, depression who was admitted on 5/30 with asthma exacerbation requiring intubation and ICU admission after failing conservative management.  #Asthma exacerbation, improving Patient is on RA and maintaining good oxygen saturation. Patient is currently on antibiotics for possible HCAP though CXR was equivocal for infiltrates vs atelectasis after sputum cultures grew Moraxella Catarrhallis. Patient this morning had mild wheezing at the base but did not appear to have increase work of breathing. Patient with positive rhinovirus on 5/31. --Will continue Brovana neb 15 mcg bid  --Will start prednisone 50 mg daily for 5 days DAY 2 (start 6/7) --O2 as needed  --Continue Azithromycin 500 mg daily DAY 4 (start 6/5) --Continue Albuterol q2 prn --Continue albuterol 2.5 nebulizer q6 --Patient will need counseling on smoking cessation  #Headaches Patient has a history of migraine headaches and has been on imitrex and gabapentin.   #Chest pain, stable, improved EKG on admission was not concerning given increased work of breathing and use of accessory muscles during acute exacerbation. Likely demand ischemia with trop initially <0.03 rising to a max of 0.75. Now down trending. --Will continue to monitor symptoms --Trend troponin as needed --Cardiology consult as needed.  #AKI,  acute, resolving Patient kidney function continue to improve. Patient briefly received CRRT/HD for oliguria and worsening kidney function with a creatinine of 2.16 while in the ICU. Patient is now making urine.This morning creatinine is 1.38 up from 1.17 likely secondary to decrease fluid intake. --NS 100 cc/hr for 12 hr  --Will follow up on daily Renal Function Panel --Encourage po intake   --Consult nephrology as needed  #T2DM, fairly well controlled A1c on admission (5/31) was 7.4 up from 6.5 on (2/26). Blood glucose have been mildly elevated in the setting of steroid course. Patient has require minimal rapid acting insulin. --Will continue resistant SSI regimen  #Hypertension, uncontrolled  Patient BP have been labile and required home med and PRN. Patient's amlodipine was increased from 5 mg to 10 mg. Patient required hydralazine overnight. This morning BP is 101/70 suspect in the setting of low fluid intake and mild dehydration also noted with worsening creatinine. --Will continue amlodipine 10 mg daily --Gentle IVF as seen above --Will consider restarting Losartan giving improving kidney function  --Continue hydralazine PRN  #GERD Patient was intubated for about a week. --Continue pantoprazole 40 mg daily   #HLD  --Continue simvastatin 10 mg daily  #Constipation --Continue Miralax 17g bid  --Continue Senna-Docusate 1 tab bid  #Depression --Will continue Zoloft 50 mg daily   #Migraines, chronic --Will continue Topiramate 100 mg bid   #Smoking cessation Patient is interested in quitting after discharge    FEN/GI: Carb modified diet PPx: Lovenox  Disposition: Patient has been evaluated by CIR and is a candidate, will be transferred when a bed is available. Patient is currently stable and improving.  Subjective:    Objective: Temp:  [98.4 F (36.9 C)-99.4 F (37.4 C)] 98.4 F (36.9 C) (06/08 0421) Pulse Rate:  [  81-117] 81 (06/08 0421) Resp:  [16-26] 26 (06/08  0421) BP: (101-159)/(70-97) 101/70 (06/08 0421) SpO2:  [94 %-100 %] 94 % (06/08 0421) Weight:  [233 lb 0.4 oz (105.7 kg)] 233 lb 0.4 oz (105.7 kg) (06/08 0543)  Physical Exam:  General: Tired appearing woman, in NAD, able to participate in exam, patient stil with some hoarseness. Cardiac: RRR, normal heart sounds, no murmurs. 2+ radial and PT pulses bilaterally Respiratory: Mild expiratory wheezing at the lungs bases bilaterally, but good air movement and no increase work of breathing. Abdomen: soft, nontender, nondistended, no hepatic or splenomegaly, +BS Extremities: no edema or cyanosis. WWP. Skin: warm and dry, no rashes noted Neuro: alert and oriented x4, no focal deficits Psych: Normal affect and mood   Laboratory:  Recent Labs Lab 02/25/17 0235 02/26/17 0710 02/27/17 0452  WBC 19.8* 18.5* 17.8*  HGB 11.4* 12.3 11.1*  HCT 34.3* 37.0 33.5*  PLT 272 316 343    Recent Labs Lab 02/25/17 0235 02/26/17 0450 02/27/17 0452  NA 136 134* 135  K 4.7 4.2 3.6  CL 104 104 102  CO2 20* 19* 23  BUN 45* 34* 35*  CREATININE 1.25* 1.17* 1.38*  CALCIUM 8.8* 8.8* 8.7*  GLUCOSE 174* 137* 184*    Imaging/Diagnostic Tests: Dg Chest Port 1 View  Result Date: 02/19/2017 CLINICAL DATA:  Status post central line placement EXAM: PORTABLE CHEST 1 VIEW COMPARISON:  02/19/2017 FINDINGS: Cardiac shadow remains enlarged. Endotracheal tube and nasogastric catheter are noted in satisfactory position. A right jugular central line is noted in the mid superior vena cava. No pneumothorax is seen. The overall inspiratory effort is poor with crowding vascular markings. No focal infiltrate is seen. IMPRESSION: No pneumothorax following central line placement. Electronically Signed   By: Inez Catalina M.D.   On: 02/19/2017 14:47   Dg Chest Port 1 View  Result Date: 02/19/2017 CLINICAL DATA:  Acute onset of shortness of breath. Endotracheal tube placement. Initial encounter. EXAM: PORTABLE CHEST 1 VIEW  COMPARISON:  Chest radiograph performed 02/18/2017 FINDINGS: The patient's endotracheal tube is seen ending 3 cm above the carina. An enteric tube is noted extending below the diaphragm. Vascular congestion is noted. Mild left-sided airspace opacities may reflect pneumonia or mild asymmetric interstitial edema. No pleural effusion or pneumothorax is seen. The cardiomediastinal silhouette is mildly enlarged. No acute osseous abnormalities are identified. IMPRESSION: 1. Endotracheal tube seen ending 3 cm above the carina. 2. Vascular congestion and mild cardiomegaly. Mild left-sided airspace opacities may reflect pneumonia or mild asymmetric interstitial edema. Electronically Signed   By: Garald Balding M.D.   On: 02/19/2017 01:33   Dg Chest Port 1 View  Result Date: 02/18/2017 CLINICAL DATA:  Shortness of breath and cough for 2 days EXAM: PORTABLE CHEST 1 VIEW COMPARISON:  08/22/2015 FINDINGS: The heart size and mediastinal contours are within normal limits. Both lungs are clear. The visualized skeletal structures are unremarkable. IMPRESSION: No active disease. Electronically Signed   By: Inez Catalina M.D.   On: 02/18/2017 16:36   Marjie Skiff, MD 02/27/2017, 6:38 AM PGY-1, [place] Intern pager: [phone], text pages welcome

## 2017-02-27 NOTE — Progress Notes (Signed)
Brittney Arn, MD Physician Signed Physical Medicine and Rehabilitation  Consult Note Date of Service: 02/25/2017 4:00 PM  Related encounter: ED to Hosp-Admission (Discharged) from 02/18/2017 in [facility] All Collapse All   [] Hide copied text [] Hover for attribution information      Physical Medicine and Rehabilitation Consult  Reason for Consult: Critical illness polyneuropathy.  Referring Physician: Dr. Lamonte Sakai  HPI: [name] is a 46 y.o. female with history of asthma, T2DM, chronic pain, HA; who was admitted on 02/18/17 with asthma exacerbation requiring intubation. History taken from chart review and multiple family members. She was treated with steroids and antibiotics with improvement and tolerated extubation on 6/5.  She developed AKI requiring brief CRRT/HD thorough 6/4 and is showing improvement in SCr and UOP.  She was started on regular diet today and PT evaluation done revealing significant weakness BUE/BLE. CIR recommended for follow up therapy.   Review of Systems  Constitutional: Positive for malaise/fatigue.  HENT: Negative for hearing loss and tinnitus.   Eyes: Negative for blurred vision and double vision.  Respiratory: Positive for cough and shortness of breath.   Cardiovascular: Positive for leg swelling. Negative for chest pain and palpitations.  Gastrointestinal: Positive for heartburn and nausea. Negative for abdominal pain.  Musculoskeletal: Positive for back pain (chronic back pain radiating to RLE) and myalgias.  Skin: Negative for itching and rash.  Neurological: Positive for dizziness, sensory change (numbness/tingling RUE/RLE), speech change, focal weakness, weakness (Had to take rest breaks to complete home tasks. ) and headaches (chronic/daily).  Psychiatric/Behavioral: Negative for memory loss.  All other systems reviewed and are negative.         Past Medical History:  Diagnosis Date    . Arthritis   . Asthma   . Chronic back pain   . Chronic chest pain   . Depression   . DM (diabetes mellitus) ([name])    INSULIN DEPENDENT  . GERD (gastroesophageal reflux disease)   . Headache(784.0)   . Hypertension   . Hypokalemia          Past Surgical History:  Procedure Laterality Date  . APPENDECTOMY    . CESAREAN SECTION     x 3  . CHOLECYSTECTOMY N/A 03/02/2014   Procedure: LAPAROSCOPIC CHOLECYSTECTOMY;  Surgeon: Joyice Faster. Cornett, MD;  Location: [facility];  Service: General;  Laterality: N/A;  . HERNIA REPAIR    . ROTATOR CUFF REPAIR    . VESICOVAGINAL FISTULA CLOSURE W/ TAH  2009         Family History  Problem Relation Age of Onset  . Heart attack Mother   . Stroke Mother   . Diabetes Mother   . Hypertension Mother   . Arthritis Mother   . Stroke Father   . Hypertension Sister   . Diabetes Sister   . Seizures Brother   . Diabetes Brother     Social History:  [name] with husband and grown children. Disabled due to multiple medical issues. Sedentary and limited by SOB but manages home/meal prep. She reports that she has been smoking Cigarettes one pack every 3 days.  She has a 5.50 pack-year smoking history. She has never used smokeless tobacco. She reports that she drinks about 0.6 - 1.2 oz of alcohol per week . She reports that she does not use drugs.          Allergies  Allergen Reactions  . Reglan [Metoclopramide] Other (See Comments)  Panic attack          Medications Prior to Admission  Medication Sig Dispense Refill  . albuterol (PROVENTIL HFA;VENTOLIN HFA) 108 (90 Base) MCG/ACT inhaler Inhale 2 puffs into the lungs every 6 (six) hours as needed for wheezing or shortness of breath. Reported on 02/22/2016 1 each 3  . albuterol (PROVENTIL) (2.5 MG/3ML) 0.083% nebulizer solution Take 3 mLs (2.5 mg total) by nebulization every 6 (six) hours as needed for wheezing or shortness of breath. Reported on  02/22/2016 75 mL 3  . amLODipine (NORVASC) 5 MG tablet Take 1 tablet (5 mg total) by mouth daily. 90 tablet 1  . aspirin EC 325 MG tablet Take 325 mg by mouth daily.    . bromocriptine (PARLODEL) 2.5 MG tablet 1/4 tab daily (Patient taking differently: Take 0.625 mg by mouth daily. ) 20 tablet 2  . dapagliflozin propanediol (FARXIGA) 10 MG TABS tablet Take 10 mg by mouth daily. 30 tablet 11  . Dulaglutide (TRULICITY) 1.5 FY/1.0FB SOPN Inject 1.5 mg into the skin once a week. 4 pen 11  . Fluticasone-Salmeterol (ADVAIR) 250-50 MCG/DOSE AEPB Inhale 1 puff into the lungs 2 times daily at 12 noon and 4 pm. 60 each 3  . gabapentin (NEURONTIN) 300 MG capsule Take 1 capsule (300 mg total) by mouth 3 (three) times daily. 90 capsule 3  . hydrochlorothiazide (HYDRODIURIL) 25 MG tablet Take 1 tablet (25 mg total) by mouth daily. 30 tablet 4  . losartan (COZAAR) 100 MG tablet Take 1 tablet (100 mg total) by mouth daily. For Blood pressure - must have office visit for refills 30 tablet 3  . methocarbamol (ROBAXIN) 500 MG tablet Take 1 tablet (500 mg total) by mouth every 8 (eight) hours as needed for muscle spasms. 90 tablet 1  . naproxen (NAPROSYN) 500 MG tablet Take 1 tablet (500 mg total) by mouth 2 (two) times daily with a meal. 60 tablet 1  . olopatadine (PATANOL) 0.1 % ophthalmic solution Place 1 drop into the right eye 2 (two) times daily. 5 mL 0  . oxyCODONE-acetaminophen (PERCOCET) 5-325 MG tablet Take 1-2 tablets by mouth every 4 (four) hours as needed. (Patient taking differently: Take 1-2 tablets by mouth every 4 (four) hours as needed for moderate pain. ) 20 tablet 0  . pantoprazole (PROTONIX) 40 MG tablet Take 1 tablet (40 mg total) by mouth daily. 30 tablet 3  . prochlorperazine (COMPAZINE) 10 MG tablet Take 1 tablet (10 mg total) by mouth 2 (two) times daily as needed for nausea or vomiting. 10 tablet 0  . repaglinide (PRANDIN) 2 MG tablet 1 tab with breakfast and lunch, and 2 tabs with dinner.  (Patient taking differently: Take 2-4 mg by mouth See admin instructions. Take 2 mg by mouth with breakfast and lunch, and 4 mg by mouth with dinner.) 120 tablet 11  . sertraline (ZOLOFT) 50 MG tablet Take 1 tablet (50 mg total) by mouth daily. 30 tablet 3  . simvastatin (ZOCOR) 10 MG tablet Take 1 tablet (10 mg total) by mouth daily at 6 PM. 30 tablet 3  . SUMAtriptan (IMITREX) 100 MG tablet TAKE 1TAB AT EARLIEST ONSET OF HEADACHE. MAY REPEAT X1 IN 2 HOURS IF HEADACHE PERSISTS OR RECURS. DO NOT EXCEED 2 TABS IN 24HRS 9 tablet 2  . topiramate (TOPAMAX) 100 MG tablet Take 1 tablet (100 mg total) by mouth 2 (two) times daily. 60 tablet 3  . acetaminophen-codeine (TYLENOL #3) 300-30 MG tablet Take 1 tablet by mouth  every 12 (twelve) hours as needed for moderate pain. 30 tablet 0  . Blood Glucose Monitoring Suppl (ACCU-CHEK AVIVA PLUS) w/Device KIT Use to check blood sugar 3 times per day. DX CODE: E11.9 (Patient not taking: Reported on 02/19/2017) 1 kit 2  . glucose blood (ACCU-CHEK AVIVA PLUS) test strip Use to check blood sugar 3 times per day.DX Code E11.9 (Patient not taking: Reported on 02/19/2017) 100 each 12  . Insulin Pen Needle 32G X 4 MM MISC Use to inject insulin 3 times per day. (Patient not taking: Reported on 02/19/2017) 100 each 2  . Lancet Devices (ACCU-CHEK SOFTCLIX) lancets Use to check blood sugar 3 times per day dx code E11.9 (Patient not taking: Reported on 02/19/2017) 100 each 2  . Lancets (ACCU-CHEK MULTICLIX) lancets Use to check blood sugar 3 times per day. DX code E11.9 (Patient not taking: Reported on 02/19/2017) 100 each 12    Home: [place] expects to be discharged to:: Private residence Living Arrangements: Spouse/significant other, Children Available Help at Discharge: Family Type of Home: House Home Access: Stairs to enter Technical brewer of Steps: several Entrance Stairs-Rails: Right Home Layout: One level Home Equipment: None  Functional  History: Prior Function Level of Independence: Independent Functional Status:  Mobility: Bed Mobility Overal bed mobility: Needs Assistance Bed Mobility: Supine to Sit, Sit to Supine Supine to sit: Total assist Sit to supine: +2 for physical assistance, Max assist General bed mobility comments: Assist for all aspects  ADL:  Cognition: Cognition Overall Cognitive Status: Impaired/Different from baseline Orientation Level: Oriented X4 Cognition Arousal/Alertness: Awake/alert Behavior During Therapy: Flat affect Overall Cognitive Status: Impaired/Different from baseline Area of Impairment: Problem solving Problem Solving: Slow processing, Decreased initiation   Blood pressure (!) 162/90, pulse 96, temperature 98.7 F (37.1 C), temperature source Oral, resp. rate 15, height 5' 3"  (1.6 m), weight 107.8 kg (237 lb 10.5 oz), SpO2 94 %. Physical Exam  Nursing note and vitals reviewed. Constitutional: She is oriented to person, place, and time. She appears well-developed and well-nourished.  Fatigued appearing obese female. Anasarca noted.   HENT:  Head: Normocephalic and atraumatic.  Mouth/Throat: Oropharynx is clear and moist.  Eyes: Conjunctivae and EOM are normal. Pupils are equal, round, and reactive to light.  Neck: Normal range of motion. Neck supple.  Dry dressing right neck.   Cardiovascular: Normal rate and regular rhythm.   Respiratory: No respiratory distress. She has wheezes. She exhibits no tenderness.  Increase WOB with conversation.   GI: Soft. Bowel sounds are normal.  Musculoskeletal: She exhibits edema and tenderness.  1-2+ edema BUE/BLE  Neurological: She is alert and oriented to person, place, and time.  Encephalopathic appearing.  Dysphonia--speaks in a whisper and able to phonate occasionally.  Minimal movement throughout UE/LE Sensation diminished to light touch DTRs 3+ LLE  Skin: Skin is warm and dry.  Psychiatric: Her affect is blunt and labile.  Her speech is delayed. She is slowed.       Assessment/Plan: Diagnosis: CIP Labs independently reviewed.  Records reviewed and summated above.  1. Does the need for close, 24 hr/day medical supervision in concert with the patient's rehab needs make it unreasonable for this patient to be served in a less intensive setting? Yes 2. Co-Morbidities requiring supervision/potential complications: AKI (avoid nephrotoxic meds), asthma (monitor RR and O2 sats with increased activity), T2 DM (Monitor in accordance with exercise and adjust meds as necessary), chronic pain (Biofeedback training with therapies to help reduce reliance on opiate pain  medications, particularly IV fentanyl, monitor pain control during therapies, and sedation at rest and titrate to maximum efficacy to ensure participation and gains in therapies), HA (see previous), leukocytosis (cont to monitor for signs and symptoms of infection, further workup if indicated), morbid obesity Body mass index is 42.1 kg/m., Diet and exercise education, encourage weight loss to increase endurance and promote overall health) 3. Due to safety, disease management, medication administration, pain management and patient education, does the patient require 24 hr/day rehab nursing? Yes 4. Does the patient require coordinated care of a physician, rehab nurse, PT (1-2 hrs/day, 5 days/week), OT (1-2 hrs/day, 5 days/week) and SLP (1-2 hrs/day, 5 days/week) to address physical and functional deficits in the context of the above medical diagnosis(es)? Yes Addressing deficits in the following areas: balance, endurance, locomotion, strength, transferring, bowel/bladder control, bathing, dressing, feeding, grooming, toileting, cognition, speech, language, swallowing and psychosocial support 5. Can the patient actively participate in an intensive therapy program of at least 3 hrs of therapy per day at least 5 days per week? Potentially 6. The potential for patient to  make measurable gains while on inpatient rehab is excellent 7. Anticipated functional outcomes upon discharge from inpatient rehab are min assist and mod assist  with PT, min assist and mod assist with OT, supervision with SLP. 8. Estimated rehab length of stay to reach the above functional goals is: 20-25 days. 9. Anticipated D/C setting: Home 10. Anticipated post D/C treatments: HH therapy and Home excercise program 11. Overall Rehab/Functional Prognosis: excellent  RECOMMENDATIONS: This patient's condition is appropriate for continued rehabilitative care in the following setting: CIR when medically stable and able to tolerate 3 hours therapy/day. Patient has agreed to participate in recommended program. Potentially Note that insurance prior authorization may be required for reimbursement for recommended care.  Comment: Rehab Admissions Coordinator to follow up.  Delice Lesch, MD, [facility] Drown [facility] [place], [place] 02/25/2017    Revision History                        Routing History

## 2017-02-27 NOTE — Progress Notes (Signed)
Report called to [place] on 4W; pt transferred to [phone] via wheelchair family with patient.

## 2017-02-27 NOTE — H&P (Signed)
Physical Medicine and Rehabilitation Admission H&P     Chief Complaint  Patient presents with  . Critical illness polyneuropathy  HPI: [name] is a 46 y.o. female with history of asthma, tobacco use, T2DM, chronic pain, HA; who was admitted on 02/18/17 with asthma exacerbation requiring intubation. History taken from chart review and multiple family members. She was treated with steroids and antibiotics with improvement and tolerated extubation on 6/5. She developed AKI requiring brief CRRT/HD thorough 6/4 and is showing improvement in SCr and UOP. CP/SOB due to demand ischemia and 2 D echo showed EF 65-70% with grade I diastolic dysfunction. She developed fever on 6/3 and respiratory cultures positive for moraxella and sputum with rhinovirus. She was started on Zithromax for treatment of HCAP. Hyperglycemia being managed with SSI and BP medications being titrated upwards for tighter control. Therapy initiated and patient with significant deficits in mobility and ability to carry out ADL tasks due to critical illness polyneuropathy. CIR was recommended for follow up therapy.  Review of Systems  HENT: Negative for hearing loss and tinnitus.  Eyes: Negative for blurred vision and double vision.  Cardiovascular: Negative for chest pain and palpitations.  Gastrointestinal: Negative for abdominal pain, constipation and nausea.  Severe throat pain  Genitourinary: Negative for dysuria and urgency.  Musculoskeletal: Positive for back pain and myalgias.  Skin: Negative for itching and rash.  Neurological: Positive for sensory change (tingling RUE/RLE), weakness and headaches (chronic HA).  Psychiatric/Behavioral: The patient is nervous/anxious.       Past Medical History:  Diagnosis Date  . Arthritis   . Asthma   . Chronic back pain   . Chronic chest pain   . Depression   . DM (diabetes mellitus) ([place])    INSULIN DEPENDENT  . GERD (gastroesophageal reflux disease)   .  Headache(784.0)   . Hypertension   . Hypokalemia         Past Surgical History:  Procedure Laterality Date  . APPENDECTOMY    . CESAREAN SECTION     x 3  . CHOLECYSTECTOMY N/A 03/02/2014   Procedure: LAPAROSCOPIC CHOLECYSTECTOMY; Surgeon: Joyice Faster. Cornett, MD; Location: [facility]; Service: General; Laterality: N/A;  . HERNIA REPAIR    . ROTATOR CUFF REPAIR    . VESICOVAGINAL FISTULA CLOSURE W/ TAH  2009        Family History  Problem Relation Age of Onset  . Heart attack Mother   . Stroke Mother   . Diabetes Mother   . Hypertension Mother   . Arthritis Mother   . Stroke Father   . Hypertension Sister   . Diabetes Sister   . Seizures Brother   . Diabetes Brother    Social History: [name] with husband and grown children. Disabled due to multiple medical issues. Sedentary and limited by SOB but manages home/meal prep. She reports that she has been smoking Cigarettes one pack every 3 days. She has a 5.50 pack-year smoking history. She has never used smokeless tobacco. She reports that she drinks about 0.6 - 1.2 oz of alcohol per week . She reports that she does not use drugs.       Allergies  Allergen Reactions  . Reglan [Metoclopramide] Other (See Comments)    Panic attack         Medications Prior to Admission  Medication Sig Dispense Refill  . albuterol (PROVENTIL HFA;VENTOLIN HFA) 108 (90 Base) MCG/ACT inhaler Inhale 2 puffs into the lungs every 6 (six) hours as needed for wheezing  or shortness of breath. Reported on 02/22/2016 1 each 3  . albuterol (PROVENTIL) (2.5 MG/3ML) 0.083% nebulizer solution Take 3 mLs (2.5 mg total) by nebulization every 6 (six) hours as needed for wheezing or shortness of breath. Reported on 02/22/2016 75 mL 3  . amLODipine (NORVASC) 5 MG tablet Take 1 tablet (5 mg total) by mouth daily. 90 tablet 1  . aspirin EC 325 MG tablet Take 325 mg by mouth daily.    . bromocriptine (PARLODEL) 2.5 MG tablet 1/4 tab daily (Patient taking differently: Take  0.625 mg by mouth daily. ) 20 tablet 2  . dapagliflozin propanediol (FARXIGA) 10 MG TABS tablet Take 10 mg by mouth daily. 30 tablet 11  . Dulaglutide (TRULICITY) 1.5 MM/7.6KG SOPN Inject 1.5 mg into the skin once a week. 4 pen 11  . Fluticasone-Salmeterol (ADVAIR) 250-50 MCG/DOSE AEPB Inhale 1 puff into the lungs 2 times daily at 12 noon and 4 pm. 60 each 3  . gabapentin (NEURONTIN) 300 MG capsule Take 1 capsule (300 mg total) by mouth 3 (three) times daily. 90 capsule 3  . hydrochlorothiazide (HYDRODIURIL) 25 MG tablet Take 1 tablet (25 mg total) by mouth daily. 30 tablet 4  . losartan (COZAAR) 100 MG tablet Take 1 tablet (100 mg total) by mouth daily. For Blood pressure - must have office visit for refills 30 tablet 3  . methocarbamol (ROBAXIN) 500 MG tablet Take 1 tablet (500 mg total) by mouth every 8 (eight) hours as needed for muscle spasms. 90 tablet 1  . naproxen (NAPROSYN) 500 MG tablet Take 1 tablet (500 mg total) by mouth 2 (two) times daily with a meal. 60 tablet 1  . olopatadine (PATANOL) 0.1 % ophthalmic solution Place 1 drop into the right eye 2 (two) times daily. 5 mL 0  . oxyCODONE-acetaminophen (PERCOCET) 5-325 MG tablet Take 1-2 tablets by mouth every 4 (four) hours as needed. (Patient taking differently: Take 1-2 tablets by mouth every 4 (four) hours as needed for moderate pain. ) 20 tablet 0  . pantoprazole (PROTONIX) 40 MG tablet Take 1 tablet (40 mg total) by mouth daily. 30 tablet 3  . prochlorperazine (COMPAZINE) 10 MG tablet Take 1 tablet (10 mg total) by mouth 2 (two) times daily as needed for nausea or vomiting. 10 tablet 0  . repaglinide (PRANDIN) 2 MG tablet 1 tab with breakfast and lunch, and 2 tabs with dinner. (Patient taking differently: Take 2-4 mg by mouth See admin instructions. Take 2 mg by mouth with breakfast and lunch, and 4 mg by mouth with dinner.) 120 tablet 11  . sertraline (ZOLOFT) 50 MG tablet Take 1 tablet (50 mg total) by mouth daily. 30 tablet 3  .  simvastatin (ZOCOR) 10 MG tablet Take 1 tablet (10 mg total) by mouth daily at 6 PM. 30 tablet 3  . SUMAtriptan (IMITREX) 100 MG tablet TAKE 1TAB AT EARLIEST ONSET OF HEADACHE. MAY REPEAT X1 IN 2 HOURS IF HEADACHE PERSISTS OR RECURS. DO NOT EXCEED 2 TABS IN 24HRS 9 tablet 2  . topiramate (TOPAMAX) 100 MG tablet Take 1 tablet (100 mg total) by mouth 2 (two) times daily. 60 tablet 3  . acetaminophen-codeine (TYLENOL #3) 300-30 MG tablet Take 1 tablet by mouth every 12 (twelve) hours as needed for moderate pain. 30 tablet 0  . Blood Glucose Monitoring Suppl (ACCU-CHEK AVIVA PLUS) w/Device KIT Use to check blood sugar 3 times per day. DX CODE: E11.9 (Patient not taking: Reported on 02/19/2017) 1 kit 2  .  glucose blood (ACCU-CHEK AVIVA PLUS) test strip Use to check blood sugar 3 times per day.DX Code E11.9 (Patient not taking: Reported on 02/19/2017) 100 each 12  . Insulin Pen Needle 32G X 4 MM MISC Use to inject insulin 3 times per day. (Patient not taking: Reported on 02/19/2017) 100 each 2  . Lancet Devices (ACCU-CHEK SOFTCLIX) lancets Use to check blood sugar 3 times per day dx code E11.9 (Patient not taking: Reported on 02/19/2017) 100 each 2  . Lancets (ACCU-CHEK MULTICLIX) lancets Use to check blood sugar 3 times per day. DX code E11.9 (Patient not taking: Reported on 02/19/2017) 100 each 12   Home:  [place] expects to be discharged to:: Private residence  Living Arrangements: Spouse/significant other, Children  Available Help at Discharge: Family, Available 24 hours/day  Type of Home: House  Home Access: Stairs to enter  [employer] of Steps: several  Entrance Stairs-Rails: Right  Home Layout: One level  Bathroom Shower/Tub: Gaffer, Artist: Standard  Home Equipment: Civil engineer, contracting - built in, [employer] - tub/shower, Hand held shower head  Functional History:  Prior Function  Level of Independence: Independent  Functional Status:    Mobility:  Bed Mobility  Overal bed mobility: Needs Assistance  Bed Mobility: Supine to Sit, Sit to Supine  Supine to sit: Total assist  Sit to supine: +2 for physical assistance, Max assist  General bed mobility comments: Pt up in recliner upon our arrival  Transfers  Overall transfer level: Needs assistance  Equipment used: Rolling walker (2 wheeled)  Transfers: Sit to/from Stand  Sit to Stand: Min guard, Mod assist, Min assist  General transfer comment: intially stand from recliner Mod A +2, second stand min A +2, 3rd stand min guard A +2 with use of body momentum; stabilizing Right knee initially.  Ambulation/Gait  Ambulation/Gait assistance: Min assist, +2 safety/equipment  Ambulation Distance (Feet): 3 Feet  Assistive device: Rolling walker (2 wheeled)  Gait Pattern/deviations: Step-to pattern, Decreased stride length, Decreased stance time - right, Trunk flexed  General Gait Details: Able to take a few steps with assist for weight shifting and to stabilize right knee. Fatigues. HR up to 130s bpm.  Gait velocity: decreased  Gait velocity interpretation: Below normal speed for age/gender   ADL:  ADL  Overall ADL's : Needs assistance/impaired  Eating/Feeding: Maximal assistance  Eating/Feeding Details (indicate cue type and reason): supported sitting  Grooming: Maximal assistance  Grooming Details (indicate cue type and reason): supported sitting  Upper Body Bathing: Maximal assistance  Upper Body Bathing Details (indicate cue type and reason): supported sitting  Lower Body Bathing: Total assistance  Lower Body Bathing Details (indicate cue type and reason): Mod A +2 intially to min guard A +2 at end of session  Upper Body Dressing : Total assistance  Upper Body Dressing Details (indicate cue type and reason): supported sitting  Lower Body Dressing: Total assistance  Lower Body Dressing Details (indicate cue type and reason): Mod A +2 intially to min guard A +2 at end of  session  Cognition:  Cognition  Overall Cognitive Status: Within Functional Limits for tasks assessed  Orientation Level: Oriented X4  Cognition  Arousal/Alertness: Awake/alert  Behavior During Therapy: Flat affect  Overall Cognitive Status: Within Functional Limits for tasks assessed  Area of Impairment: Problem solving  Problem Solving: Slow processing, Decreased initiation  General Comments: SLow to respond but this seems to be baseline.  Blood pressure 125/88, pulse (!) 104, temperature  98.8 F (37.1 C), temperature source Oral, resp. rate 20, height 5' 1"  (1.549 m), weight 107.4 kg (236 lb 12.4 oz), SpO2 99 %.  Physical Exam  Nursing note and vitals reviewed.  Constitutional: She is oriented to person, place, and time. She appears well-developed and well-nourished.  Restless and writhing in bed due to throat pain/burning as well as headaches.  HENT:  Head: Normocephalic and atraumatic.  Mouth/Throat: Oropharynx is clear and moist.  White coating on tongue  Eyes: Conjunctivae and EOM are normal. Pupils are equal, round, and reactive to light.  Neck: Normal range of motion. Neck supple.  Cardiovascular: Normal rate and regular rhythm.  Respiratory: Effort normal. No stridor. No respiratory distress. She has wheezes. She exhibits no tenderness.  Coarse breath sound throughout with occasional wheeze.  GI: Soft. Bowel sounds are normal. She exhibits no distension. There is no tenderness.  Musculoskeletal: She exhibits edema. She exhibits no tenderness.  Neurological: She is alert and oriented to person, place, and time.  UE 2/5 prox to 2+ distally bilaterally--?effort. LE: 1/5 HF, KE and 1+ to 2/5 ADF/PF. Decreased sensation in stocking glove distribution from upper calves distally. Fingers may be involved also but exam inconsistent.  Skin: Skin is warm and dry.  Psychiatric: Her speech is normal. Her mood appears anxious. She is agitated. Cognition and memory are not impaired.    Lab Results Last 48 Hours  Imaging Results (Last 48 hours)     Medical Problem List and Plan:  1. Functional and mobility deficits secondary to critical illness myopathy/neuropathy  -admit to inpatient rehab  2. DVT Prophylaxis/Anticoagulation: Pharmaceutical: Lovenox  3. Chronic back pain with radiculopathy/Pain Management: Has been getting IV fentanyl--discontinued. Will use hydrocodone---pt took this at home q6 prn  4. Mood: team to provide ego support. Continue zoloft. LCSW to follow for evaluation and support.  5. Neuropsych: This patient is capable of making decisions on her own behalf.  6. Skin/Wound Care: Routine pressure relief measures.  7. Fluids/Electrolytes/Nutrition: Monitor I/O. Trend SCr for recovery.  8. Asthma exacerbation: Continues on steroids for 3 additional days. Continue brovana and duonebs. Encourage aggressive pulmonary hygiene.  9. Moraxella HCAP: completes Zithromax tomorrow. Monitor WBC and temp curves  10. AKI: Avoid nephrotoxic drugs--received dose Toradol yesterday. Continue to monitor daily for recovery.  11.HTN: Monitor BP bid-=poorly controlled. On losartan and  Norvasc (dose increased yesterday)  12. T2DM: Monitor BS ac/hs. Will resume prandin and dulaglutide.  13. Morbid obesity: diet education for weight loss and to promoted over all health.  14. Leucocytosis: Likely reactive due to infection and steroids. Continue to monitor.  15. Severe Throat pain: will add Carafate ac/hs with MMW prn. Question thrush--will monitor.  16. Migraines: has been refusing Topamax. Continue Imitrex prn.  17. Neuropathy: Increase gabapentin to tid to help manage dysesthesias RUE  Post Admission Physician Evaluation:  1. Functional deficits secondary to critical illness myopathy/neuropathy. 2. Patient is admitted to receive collaborative, interdisciplinary care between the physiatrist, rehab nursing staff, and therapy team. 3. Patient's level of medical complexity and substantial therapy needs in context of that medical necessity cannot be provided at a lesser intensity of care such as a SNF. 4. Patient has experienced substantial functional loss from his/her baseline which was documented above under the "Functional History" and "Functional Status" headings. Judging by the patient's diagnosis, physical exam, and functional history, the patient has potential for functional progress which will result in measurable gains while on inpatient rehab. These gains will be of substantial and practical use upon discharge in facilitating mobility and self-care at the household level. 5. Physiatrist will provide 24 hour management of medical needs as well as oversight of the therapy plan/treatment and provide guidance as appropriate regarding the interaction of the two. 6. The Preadmission Screening has been reviewed and patient status is unchanged unless otherwise stated above. 7. 24 hour rehab nursing will assist with bladder management, bowel management, safety, skin/wound care, disease management, medication administration, pain management and patient education and help integrate therapy  concepts, techniques,education, etc. 8. PT will assess and treat for/with: Lower extremity strength, range of motion, stamina, balance, functional mobility, safety, adaptive techniques and equipment, NMR, w/c assessment, orthotics, famiy ed. Goals are: min to mod assist likely at w/c level. 9. OT will assess and treat for/with: ADL's, functional mobility, safety, upper extremity strength, adaptive techniques and equipment, NMR, ego support, family ed. Goals are: min to mod assist. Therapy may proceed with showering this patient. 10. SLP will assess and treat for/with: speech, communication, swallowing. Goals are: mod I. 11. Case Management and Social Worker will assess and treat for psychological issues and discharge planning. 12. Team conference will be held weekly to assess progress toward goals and to determine barriers to discharge. 13. Patient will receive at least 3 hours of therapy per day at least 5 days per week. 14. ELOS: 20-25 days  15. Prognosis: excellent Meredith Staggers,  MD, [facility]  02/27/2017  Bary Leriche, Hershal Coria  02/26/2017

## 2017-02-27 NOTE — Progress Notes (Signed)
Retta Diones, RN Rehab Admission Coordinator Signed Physical Medicine and Rehabilitation  PMR Pre-admission Date of Service: 02/26/2017 1:28 PM  Related encounter: ED to Hosp-Admission (Discharged) from 02/18/2017 in [facility]       [] Hide copied text PMR Admission Coordinator Pre-Admission Assessment  Patient: [name] is an 46 y.o., female MRN: [id number] DOB: 06-Feb-1971 Height: 5\' 1"  (154.9 cm) Weight: 105.7 kg (233 lb 0.4 oz)                                                                                                                                                  Insurance Information HMO: No   PPO:       PCP:       IPA:       80/20:       OTHER:   PRIMARY:  Medicare A/B      Policy#:  [id number]      Subscriber: patient CM Name:        Phone#:       Fax#:   Pre-Cert#:        Employer: Not employed Benefits:  Phone #:       Name: Checked in Financial planner. Date:  06/22/01     Deduct:  $1340      Out of Pocket Max:  none      Life Max: Unlimited CIR: 100%      SNF: 100 days Outpatient:  80%     Co-Pay: 20% Home Health: 100%      Co-Pay:  none DME: 80%     Co-Pay: 20% Providers: patient's choice  SECONDARY: Medicaid [place] Access      Policy#:  [id number]      Subscriber:  patient CM Name:        Phone#:       Fax#:   Pre-Cert#:        Employer: Not employed Benefits:  Phone #: [phone]     Name:  Automated Eff. Date: Eligible 02/26/17 with coverage code [id number]     Deduct:        Out of Pocket Max:        Life Max:   CIR:        SNF:   Outpatient:       Co-Pay:   Home Health:        Co-Pay:   DME:       Co-Pay:    Emergency Contact Information        Contact Information    Name Relation Home Work Mobile   [name],Charles Spouse [phone]     [place] Brother [phone]  [phone]     Current Medical History  Patient Admitting Diagnosis:Critical illness polyneuropathy   History of Present  Illness: A 46 y.o.femalewith history of asthma, T2DM, chronic pain, HA; who  was admitted on 02/18/17 with asthma exacerbation requiring intubation. History taken from chart review and multiple family members. She was treated with steroids and antibiotics with improvement and tolerated extubation on 6/5. She developed AKI requiring brief CRRT/HD thorough 6/4 and is showing improvement in SCr and UOP. She was started on regular diet today and PT evaluation done revealing significant weakness BUE/BLE. CIR recommended for follow up therapy.    Past Medical History      Past Medical History:  Diagnosis Date  . Arthritis   . Asthma   . Chronic back pain   . Chronic chest pain   . Depression   . DM (diabetes mellitus) ([place])    INSULIN DEPENDENT  . GERD (gastroesophageal reflux disease)   . Headache(784.0)   . Hypertension   . Hypokalemia     Family History  family history includes Arthritis in her mother; Diabetes in her brother, mother, and sister; Heart attack in her mother; Hypertension in her mother and sister; Seizures in her brother; Stroke in her father and mother.  Prior Rehab/Hospitalizations: No previous rehab.  Has the patient had major surgery during 100 days prior to admission? No  Current Medications   Current Facility-Administered Medications:  .  0.9 %  sodium chloride infusion, , Intravenous, Continuous, Raylene Miyamoto, MD, Stopped at 02/25/17 2130 .  0.9 %  sodium chloride infusion, , Intravenous, Continuous, Diallo, Abdoulaye, MD, Last Rate: 100 mL/hr at 02/27/17 0739, 100 mL at 02/27/17 0739 .  acetaminophen (TYLENOL) tablet 650 mg, 650 mg, Oral, Q4H PRN, Simonne Maffucci B, MD, 650 mg at 02/26/17 1536 .  albuterol (PROVENTIL) (2.5 MG/3ML) 0.083% nebulizer solution 2.5 mg, 2.5 mg, Nebulization, Q2H PRN, Simonne Maffucci B, MD, 10 mg at 02/19/17 1016 .  albuterol (PROVENTIL) (2.5 MG/3ML) 0.083% nebulizer solution 2.5 mg, 2.5 mg, Nebulization,  Q6H, McQuaid, Douglas B, MD, 2.5 mg at 02/27/17 4235 .  [START ON 02/28/2017] amLODipine (NORVASC) tablet 5 mg, 5 mg, Oral, Daily, Shawna Orleans, Elsia J, DO .  arformoterol (BROVANA) nebulizer solution 15 mcg, 15 mcg, Nebulization, BID, Simonne Maffucci B, MD, 15 mcg at 02/27/17 3614 .  aspirin tablet 325 mg, 325 mg, Per Tube, Daily, Desai, Rahul P, PA-C, 325 mg at 02/27/17 0918 .  azithromycin (ZITHROMAX) tablet 500 mg, 500 mg, Oral, Daily, Hensel, Jamal Collin, MD, 500 mg at 02/27/17 1007 .  budesonide (PULMICORT) nebulizer solution 0.5 mg, 0.5 mg, Nebulization, BID, McQuaid, Douglas B, MD, 0.5 mg at 02/27/17 4315 .  Chlorhexidine Gluconate Cloth 2 % PADS 6 each, 6 each, Topical, Daily, Raylene Miyamoto, MD, 6 each at 02/26/17 1000 .  enoxaparin (LOVENOX) injection 40 mg, 40 mg, Subcutaneous, Q24H, Simpson, Paula B, NP, 40 mg at 02/27/17 0918 .  feeding supplement (GLUCERNA SHAKE) (GLUCERNA SHAKE) liquid 237 mL, 237 mL, Oral, TID BM, Byrum, Rose Fillers, MD, 237 mL at 02/26/17 1000 .  fentaNYL (SUBLIMAZE) injection 12.5-25 mcg, 12.5-25 mcg, Intravenous, Q2H PRN, Simonne Maffucci B, MD, 25 mcg at 02/27/17 0916 .  gabapentin (NEURONTIN) tablet 300 mg, 300 mg, Oral, TID, Diallo, Abdoulaye, MD, 300 mg at 02/27/17 4008 .  hydrALAZINE (APRESOLINE) injection 10 mg, 10 mg, Intravenous, Q4H PRN, Raylene Miyamoto, MD, 10 mg at 02/26/17 0016 .  insulin aspart (novoLOG) injection 0-20 Units, 0-20 Units, Subcutaneous, Q4H, Mannam, Praveen, MD, 3 Units at 02/27/17 0821 .  MEDLINE mouth rinse, 15 mL, Mouth Rinse, BID, Byrum, Rose Fillers, MD, 15 mL at 02/27/17 1000 .  olopatadine (PATANOL) 0.1 %  ophthalmic solution 1 drop, 1 drop, Right Eye, BID, Carlyle Dolly, MD, 1 drop at 02/27/17 [phone] .  pantoprazole (PROTONIX) EC tablet 40 mg, 40 mg, Oral, QHS, Simpson, Paula B, NP, 40 mg at 02/26/17 2157 .  phenol (CHLORASEPTIC) mouth spray 1 spray, 1 spray, Mouth/Throat, PRN, Diallo, Abdoulaye, MD .  polyethylene glycol (MIRALAX /  GLYCOLAX) packet 17 g, 17 g, Oral, BID, Vickii Chafe Corine Shelter, MD, 17 g at 02/27/17 [phone] .  potassium chloride 20 MEQ/15ML (10%) solution 40 mEq, 40 mEq, Per Tube, Once, Desai, Rahul P, PA-C .  predniSONE (DELTASONE) tablet 50 mg, 50 mg, Oral, Q breakfast, Verner Mould, MD, 50 mg at 02/27/17 0746 .  senna-docusate (Senokot-S) tablet 1 tablet, 1 tablet, Oral, BID, Vickii Chafe Corine Shelter, MD, 1 tablet at 02/27/17 [phone] .  sertraline (ZOLOFT) tablet 50 mg, 50 mg, Oral, Daily, Jennelle Human B, NP, 50 mg at 02/27/17 0918 .  simvastatin (ZOCOR) tablet 10 mg, 10 mg, Oral, q1800, Jennelle Human B, NP, 10 mg at 02/26/17 1712 .  SUMAtriptan (IMITREX) tablet 100 mg, 100 mg, Oral, PRN, Diallo, Abdoulaye, MD  Patients Current Diet: Diet Carb Modified Fluid consistency: Thin; Room service appropriate? Yes  Precautions / Restrictions Precautions Precautions: Fall Precaution Comments: HR intermittently tachy this session Restrictions Weight Bearing Restrictions: No   Has the patient had 2 or more falls or a fall with injury in the past year?Yes.  Husband reports 2 falls with soreness and bruising.  Has right leg weakness premorbidly.  Prior Activity Level Limited Community (1-2x/wk): Went out 1-2 X a week.  Does not have a working vehicle at this time.  Home Assistive Devices / Equipment Home Assistive Devices/Equipment: Kasandra Knudsen (specify quad or straight) (spouse states that she does not use ) Home Equipment: Shower seat - built in, [employer] - tub/shower, Hand held shower head  Prior Device Use: Indicate devices/aids used by the patient prior to current illness, exacerbation or injury? None per husband.  Prior Functional Level Prior Function Level of Independence: Independent  Self Care: Did the patient need help bathing, dressing, using the toilet or eating?  Independent  Indoor Mobility: Did the patient need assistance with walking from room to room (with or without device)?  Independent  Stairs: Did the patient need assistance with internal or external stairs (with or without device)? Independent  Functional Cognition: Did the patient need help planning regular tasks such as shopping or remembering to take medications? Independent  Current Functional Level Cognition  Overall Cognitive Status: Within Functional Limits for tasks assessed Orientation Level: Oriented X4 General Comments: SLow to respond but this seems to be baseline.    Extremity Assessment (includes Sensation/Coordination)  Upper Extremity Assessment: RUE deficits/detail, LUE deficits/detail RUE Deficits / Details: edematous, can barely lift arm off of arm rest of recliner, can use it to pull herself forward in recliner with use of arm rests RUE Coordination: decreased fine motor, decreased gross motor LUE Deficits / Details: edematous; grossly 2+/5 LUE Coordination: decreased fine motor, decreased gross motor  Lower Extremity Assessment: RLE deficits/detail, LLE deficits/detail RLE Deficits / Details: grossly 2-2+/5 LLE Deficits / Details: grossly 2-2+/5    ADLs  Overall ADL's : Needs assistance/impaired Eating/Feeding: Maximal assistance Eating/Feeding Details (indicate cue type and reason): supported sitting Grooming: Maximal assistance Grooming Details (indicate cue type and reason): supported sitting Upper Body Bathing: Maximal assistance Upper Body Bathing Details (indicate cue type and reason): supported sitting Lower Body Bathing: Total assistance Lower Body Bathing Details (indicate  cue type and reason): Mod A +2 intially to min guard A +2 at end of session Upper Body Dressing : Total assistance Upper Body Dressing Details (indicate cue type and reason): supported sitting Lower Body Dressing: Total assistance Lower Body Dressing Details (indicate cue type and reason): Mod A +2 intially to min guard A +2 at end of session    Mobility  Overal bed mobility: Needs  Assistance Bed Mobility: Supine to Sit, Sit to Supine Supine to sit: Total assist Sit to supine: +2 for physical assistance, Max assist General bed mobility comments: Pt up in recliner upon our arrival    Transfers  Overall transfer level: Needs assistance Equipment used: Rolling walker (2 wheeled) Transfers: Sit to/from Stand Sit to Stand: Min guard, Mod assist, Min assist General transfer comment: intially stand from recliner Mod A +2, second stand min A +2, 3rd stand min guard A +2 with use of body momentum; stabilizing Right knee initially.    Ambulation / Gait / Stairs / Wheelchair Mobility  Ambulation/Gait Ambulation/Gait assistance: Min assist, +2 safety/equipment Ambulation Distance (Feet): 3 Feet Assistive device: Rolling walker (2 wheeled) Gait Pattern/deviations: Step-to pattern, Decreased stride length, Decreased stance time - right, Trunk flexed General Gait Details: Able to take a few steps with assist for weight shifting and to stabilize right knee. Fatigues. HR up to 130s bpm. Gait velocity: decreased Gait velocity interpretation: Below normal speed for age/gender    Posture / Balance Dynamic Sitting Balance Sitting balance - Comments: Pt sat EOB x 15 minutes with mod to min guard assist. Pt initially light headed with sitting but BP without drop and 160's/80. SpO2 90% as fatigued on RA. Primarily needed assist due to weakness and fatigue more than true balance problem. Balance Overall balance assessment: Needs assistance Sitting-balance support: Feet supported, No upper extremity supported Sitting balance-[name]: Poor Sitting balance - Comments: Pt sat EOB x 15 minutes with mod to min guard assist. Pt initially light headed with sitting but BP without drop and 160's/80. SpO2 90% as fatigued on RA. Primarily needed assist due to weakness and fatigue more than true balance problem. Postural control: Right lateral lean, Left lateral lean Standing balance  support: During functional activity Standing balance-[name]: Poor Standing balance comment: reliant on RW for support. Cues for upright posture. Able to stand with cues for upright posture.     Special needs/care consideration BiPAP/CPAP No CPM No Continuous Drip IV KVO Dialysis No        Life Vest No Oxygen No, but has nebulizers for asthma Special Bed No Trach Size No Wound Vac (area) No      Skin Feet are discolored with dry skin                             Bowel mgmt: Last BM 02/26/17 Bladder mgmt: Voiding up on [facility] with assistance Diabetic mgmt Yes, on insulin here in hospital    Previous Home Environment Living Arrangements: Spouse/significant other, Children Available Help at Discharge: Family, Available 24 hours/day Type of Home: House Home Layout: One level Home Access: Stairs to enter Entrance Stairs-Rails: Right Entrance Stairs-Number of Steps: several Bathroom Shower/Tub: Gaffer, Architectural technologist: Standard Home Care Services: No  Discharge Living Setting Plans for Discharge Living Setting: Lives with (comment), Mobile Home (Lives with husband, children, nephew and his girlfriend.) Type of Home at Discharge: Mobile home Discharge Home Layout: One level Discharge Home Access: Stairs to  enter Entrance Stairs-Number of Steps: 5-6 steps entry Does the patient have any problems obtaining your medications?: No  Social/Family/Support Systems Patient Roles: Spouse, Parent (Has a husband and 3 grown children.) Contact Information: [name] - spouse - [phone] Anticipated Caregiver: Spouse, children, nephew and extended family members Anticipated Caregiver's Contact Information: York Ram - brother - [phone] Ability/Limitations of Caregiver: Husband works.  Nephew and his girlfriend currently not working and can assist.  Children work varying jobs. Caregiver Availability: 24/7 Discharge Plan Discussed with Primary  Caregiver: Yes Is Caregiver In Agreement with Plan?: Yes Does Caregiver/Family have Issues with Lodging/Transportation while Pt is in Rehab?: No  Goals/Additional Needs Patient/Family Goal for Rehab: PT/OT min to mod assist, SLP supervision goals Expected length of stay: 20-25 days Cultural Considerations: Christian Dietary Needs: Carb mod, med cal, thin liquids Equipment Needs: TBD Pt/Family Agrees to Admission and willing to participate: Yes Program Orientation Provided & Reviewed with Pt/Caregiver Including Roles  & Responsibilities: Yes  Decrease burden of Care through IP rehab admission: N/A  Possible need for SNF placement upon discharge: No, patient is refusing.  Patient Condition: This patient's medical and functional status has changed since the consult dated: 02/25/17 in which the Rehabilitation Physician determined and documented that the patient's condition is appropriate for intensive rehabilitative care in an inpatient rehabilitation facility. See "History of Present Illness" (above) for medical update. Functional changes are: Currently requiring min to mod assist for transfers and ambulated 3 feet RW. Patient's medical and functional status update has been discussed with the Rehabilitation physician and patient remains appropriate for inpatient rehabilitation. Will admit to inpatient rehab today.  Preadmission Screen Completed By:  Retta Diones, 02/27/2017 12:27 PM ______________________________________________________________________   Discussed status with Dr. Naaman Plummer on 02/27/17 at 1217 and received telephone approval for admission today.  Admission Coordinator:  Retta Diones, time 1227/Date 02/27/17       Cosigned by: Meredith Staggers, MD at 02/27/2017 12:41 PM  Revision History

## 2017-02-28 ENCOUNTER — Inpatient Hospital Stay (HOSPITAL_COMMUNITY): Payer: Medicare Other | Admitting: Speech Pathology

## 2017-02-28 ENCOUNTER — Inpatient Hospital Stay (HOSPITAL_COMMUNITY): Payer: Medicare Other | Admitting: Physical Therapy

## 2017-02-28 ENCOUNTER — Inpatient Hospital Stay (HOSPITAL_COMMUNITY): Payer: Medicare Other

## 2017-02-28 DIAGNOSIS — E8809 Other disorders of plasma-protein metabolism, not elsewhere classified: Secondary | ICD-10-CM

## 2017-02-28 DIAGNOSIS — N179 Acute kidney failure, unspecified: Secondary | ICD-10-CM

## 2017-02-28 DIAGNOSIS — J189 Pneumonia, unspecified organism: Secondary | ICD-10-CM

## 2017-02-28 DIAGNOSIS — E669 Obesity, unspecified: Secondary | ICD-10-CM

## 2017-02-28 DIAGNOSIS — E1169 Type 2 diabetes mellitus with other specified complication: Secondary | ICD-10-CM

## 2017-02-28 DIAGNOSIS — J45901 Unspecified asthma with (acute) exacerbation: Secondary | ICD-10-CM

## 2017-02-28 DIAGNOSIS — E46 Unspecified protein-calorie malnutrition: Secondary | ICD-10-CM

## 2017-02-28 LAB — COMPREHENSIVE METABOLIC PANEL
ALK PHOS: 66 U/L (ref 38–126)
ALT: 26 U/L (ref 14–54)
ANION GAP: 9 (ref 5–15)
AST: 17 U/L (ref 15–41)
Albumin: 2.4 g/dL — ABNORMAL LOW (ref 3.5–5.0)
BILIRUBIN TOTAL: 0.4 mg/dL (ref 0.3–1.2)
BUN: 25 mg/dL — ABNORMAL HIGH (ref 6–20)
CALCIUM: 8.3 mg/dL — AB (ref 8.9–10.3)
CO2: 21 mmol/L — ABNORMAL LOW (ref 22–32)
Chloride: 105 mmol/L (ref 101–111)
Creatinine, Ser: 1.17 mg/dL — ABNORMAL HIGH (ref 0.44–1.00)
GFR, EST NON AFRICAN AMERICAN: 55 mL/min — AB (ref >60)
Glucose, Bld: 239 mg/dL — ABNORMAL HIGH (ref 65–99)
Potassium: 3.8 mmol/L (ref 3.5–5.1)
Sodium: 135 mmol/L (ref 135–145)
TOTAL PROTEIN: 6.1 g/dL — AB (ref 6.5–8.1)

## 2017-02-28 LAB — CBC WITH DIFFERENTIAL/PLATELET
Basophils Absolute: 0 10*3/uL (ref 0.0–0.1)
Basophils Relative: 0 %
Eosinophils Absolute: 0 10*3/uL (ref 0.0–0.7)
Eosinophils Relative: 0 %
HCT: 32.6 % — ABNORMAL LOW (ref 36.0–46.0)
HEMOGLOBIN: 10.5 g/dL — AB (ref 12.0–15.0)
LYMPHS PCT: 30 %
Lymphs Abs: 5.3 10*3/uL — ABNORMAL HIGH (ref 0.7–4.0)
MCH: 28.2 pg (ref 26.0–34.0)
MCHC: 32.2 g/dL (ref 30.0–36.0)
MCV: 87.4 fL (ref 78.0–100.0)
Monocytes Absolute: 1.8 10*3/uL — ABNORMAL HIGH (ref 0.1–1.0)
Monocytes Relative: 10 %
NEUTROS ABS: 10.4 10*3/uL — AB (ref 1.7–7.7)
NEUTROS PCT: 60 %
Platelets: 345 10*3/uL (ref 150–400)
RBC: 3.73 MIL/uL — AB (ref 3.87–5.11)
RDW: 14.5 % (ref 11.5–15.5)
WBC: 17.5 10*3/uL — AB (ref 4.0–10.5)

## 2017-02-28 LAB — GLUCOSE, CAPILLARY
GLUCOSE-CAPILLARY: 228 mg/dL — AB (ref 65–99)
GLUCOSE-CAPILLARY: 137 mg/dL — AB (ref 65–99)
Glucose-Capillary: 214 mg/dL — ABNORMAL HIGH (ref 65–99)
Glucose-Capillary: 104 mg/dL — ABNORMAL HIGH (ref 65–99)

## 2017-02-28 MED ORDER — PRO-STAT SUGAR FREE PO LIQD
30.0000 mL | Freq: Two times a day (BID) | ORAL | Status: DC
  Administered 2017-02-28 – 2017-03-02 (×3): 30 mL via ORAL
  Filled 2017-02-28 (×8): qty 30

## 2017-02-28 MED ORDER — ALBUTEROL SULFATE (2.5 MG/3ML) 0.083% IN NEBU
2.5000 mg | INHALATION_SOLUTION | Freq: Four times a day (QID) | RESPIRATORY_TRACT | Status: DC
  Administered 2017-02-28 – 2017-03-02 (×9): 2.5 mg via RESPIRATORY_TRACT
  Filled 2017-02-28 (×11): qty 3

## 2017-02-28 NOTE — IPOC Note (Addendum)
Overall Plan of Care [facility]) Patient Details Name: [name] MRN: [id number] DOB: [birth year]  Admitting Diagnosis: CIP  Hospital Problems: Principal Problem:   Critical illness polyneuropathy ([place]) Active Problems:   Critical illness myopathy   Hypoalbuminemia due to protein-calorie malnutrition ([place])   Benign essential HTN   HCAP (healthcare-associated pneumonia)   Severe asthma with acute exacerbation   SOB (shortness of breath)   Hemoptysis     Functional Problem List: Nursing Pain  PT Balance, Endurance, Motor, Safety, Sensory  OT Balance, Edema, Endurance, Safety, Pain  SLP Cognition, Linguistic, Nutrition  TR         Basic ADL's: OT Grooming, Bathing, Dressing, Toileting     Advanced  ADL's: OT Simple Meal Preparation     Transfers: PT Bed Mobility, Bed to Chair, Car, Patent attorney, Agricultural engineer: PT Emergency planning/management officer, Ambulation, Stairs     Additional Impairments: OT    SLP Swallowing, Communication, Social Cognition expression Problem Solving, Memory  TR      Anticipated Outcomes Item Anticipated Outcome  Self Feeding MOD I  Swallowing  Mod I    Basic self-care  S-MOD I  Toileting  S   Bathroom Transfers S  Bowel/Bladder   (remains continnent)  Transfers  supervision with LRAD  Locomotion  supervision for ambulation, min assist stair negotiation  Communication  Mod I   Cognition  Mod I  Pain  will remain at baseline of 5  Safety/Judgment  will be safe while under care on 4W   Therapy Plan: PT Intensity: Minimum of 1-2 x/day ,45 to 90 minutes PT Frequency: 5 out of 7 days PT Duration Estimated Length of Stay: 2.5 - 3 weeks OT Intensity: Minimum of 1-2 x/day, 45 to 90 minutes OT Frequency: 5 out of 7 days OT Duration/Estimated Length of Stay: 18-21 SLP Intensity: Minumum of 1-2 x/day, 30 to 90 minutes SLP Frequency: 3 to 5 out of 7 days SLP Duration/Estimated Length of Stay: 15-18  (potnetially less for SLP)       Team Interventions: Nursing Interventions Patient/Family Education, Pain Management, Psychosocial Support  PT interventions Ambulation/gait training, Community reintegration, Engineer, drilling, Neuromuscular re-education, Psychosocial support, Stair training, UE/LE Strength taining/ROM, Wheelchair propulsion/positioning, UE/LE Coordination activities, Therapeutic Activities, Pain management, Discharge planning, Training and development officer, Disease management/prevention, Functional mobility training, Cognitive remediation/compensation, Patient/family education, Splinting/orthotics, Therapeutic Exercise  OT Interventions Training and development officer, Community reintegration, Discharge planning, Disease mangement/prevention, DME/adaptive equipment instruction, Functional mobility training, Pain management, Patient/family education, Self Care/advanced ADL retraining, Therapeutic Activities, UE/LE Coordination activities, Therapeutic Exercise, UE/LE Strength taining/ROM  SLP Interventions Cognitive remediation/compensation, Cueing hierarchy, Dysphagia/aspiration precaution training, Environmental controls, Functional tasks, Internal/external aids, Medication managment, Patient/family education, Speech/Language facilitation, Other (comment) (vocal hygiene education )  TR Interventions    SW/CM Interventions Discharge Planning, Psychosocial Support, Patient/Family Education    Team Discharge Planning: Destination: PT-Home ,OT- Home , SLP-Home Projected Follow-up: PT-Home health PT, 24 hour supervision/assistance, OT-  Home health OT, SLP-None Projected Equipment Needs: PT-Rolling walker with 5" wheels, OT- To be determined, SLP-None recommended by SLP Equipment Details: PT- , OT-  Patient/family involved in discharge planning: PT- Patient, Family member/caregiver,  OT-Patient, SLP-Patient  MD ELOS: 13-16 days. Medical Rehab Prognosis:   Excellent Assessment: 46 y.o. female with history of asthma, tobacco use, T2DM, chronic pain, HA; who was admitted on 02/18/17 with asthma exacerbation requiring intubation. She was treated with steroids and antibiotics with improvement and tolerated extubation on 6/5. She developed AKI requiring brief  CRRT/HD thorough 6/4 and is showing improvement in SCr and UOP. CP/SOB due to demand ischemia and 2 D echo showed EF 65-70% with grade I diastolic dysfunction. She developed fever on 6/3 and respiratory cultures positive for moraxella and sputum with rhinovirus. She was started on Zithromax for treatment of HCAP. Hyperglycemia BP medications being titrated upwards for tighter control. Therapy initiated and patient with significant deficits in mobility and ability to carry out ADL tasks due to critical illness polyneuropathy. Will set goals for Supervision with PT/OT and Mod I with SLP.     See Team Conference Notes for weekly updates to the plan of care

## 2017-02-28 NOTE — Evaluation (Signed)
Physical Therapy Assessment and Plan  Patient Details  Name: [name] MRN: [id number] Date of Birth: 1970-12-29  PT Diagnosis: Abnormal posture, Abnormality of gait, Coordination disorder, Difficulty walking, Low back pain and Muscle weakness Rehab Potential: Good ELOS: 2.5 - 3 weeks   Today's Date: 02/28/2017 PT Individual Time: 7078-6754 PT Individual Time Calculation (min): 57 min    Problem List:  Patient Active Problem List   Diagnosis Date Noted  . Hypoalbuminemia due to protein-calorie malnutrition ([place])   . Benign essential HTN   . HCAP (healthcare-associated pneumonia)   . Severe asthma with acute exacerbation   . Critical illness myopathy 02/27/2017  . Acute on chronic respiratory failure with hypoxemia ([place])   . AKI (acute kidney injury) ([place])   . Diabetes mellitus type 2 in obese ([place])   . Morbid obesity ([place])   . Chronic pain syndrome   . Leukocytosis   . Critical illness polyneuropathy ([place])   . Acute renal failure ([place])   . Gastritis and gastroduodenitis 02/22/2016  . Hyperlipidemia 02/22/2016  . Depression 02/22/2016  . Migraine 01/30/2015  . Headache 01/30/2015  . URI (upper respiratory infection) 06/04/2014  . Atypical chest pain 02/26/2014  . DM type 2 (diabetes mellitus, type 2) ([place]) 02/26/2014  . Stable asthma 02/26/2014  . Chest pain 11/10/2013  . Hypertensive urgency 11/10/2013  . Back pain 09/05/2013  . Essential hypertension 09/05/2013  . Acute bronchitis 05/01/2013  . Asthma exacerbation 04/27/2013  . Acute respiratory failure ([place]) 04/27/2013  . Uncontrolled diabetes mellitus ([place]) 04/27/2013  . Obesity 04/27/2013  . Allergic rhinitis, seasonal 08/11/2012  . Chronic cough 08/11/2012  . Sleep apnea, obstructive 12/04/2011    Past Medical History:  Past Medical History:  Diagnosis Date  . Arthritis   . Asthma   . Chronic back pain   . Chronic chest pain   . Depression   . DM (diabetes mellitus) ([place])    INSULIN  DEPENDENT  . GERD (gastroesophageal reflux disease)   . Headache(784.0)   . Hypertension   . Hypokalemia    Past Surgical History:  Past Surgical History:  Procedure Laterality Date  . APPENDECTOMY    . CESAREAN SECTION     x 3  . CHOLECYSTECTOMY N/A 03/02/2014   Procedure: LAPAROSCOPIC CHOLECYSTECTOMY;  Surgeon: Joyice Faster. Cornett, MD;  Location: [facility];  Service: General;  Laterality: N/A;  . HERNIA REPAIR    . ROTATOR CUFF REPAIR    . VESICOVAGINAL FISTULA CLOSURE W/ TAH  2009    Assessment & Plan Clinical Impression: Patient is a 46 y.o. year old female with history of asthma, tobacco use, T2DM, chronic pain, HA; who was admitted on 02/18/17 with asthma exacerbation requiring intubation. History taken from chart review and multiple family members. She was treated with steroids and antibiotics with improvement and tolerated extubation on 6/5. She developed AKI requiring brief CRRT/HD thorough 6/4 and is showing improvement in SCr and UOP. CP/SOB due to demand ischemia and 2 D echo showed EF 65-70% with grade I diastolic dysfunction. She developed fever on 6/3 and respiratory cultures positive for moraxella and sputum with rhinovirus. She was started on Zithromax for treatment of HCAP. Hyperglycemia being managed with SSI and BP medications being titrated upwards for tighter control. Therapy initiated and patient with significant deficits in mobility and ability to carry out ADL tasks due to critical illness polyneuropathy. CIR was recommended for follow up therapy.   Patient transferred to CIR on 02/27/2017 .   Patient  currently requires mod with mobility secondary to muscle weakness, decreased cardiorespiratoy endurance, decreased coordination, and decreased sitting balance, decreased standing balance, decreased postural control and decreased balance strategies.  Prior to hospitalization, patient reports she has needed assistance in the last year 2/2 back pain. Pt lived with Spouse, Family in a  Mobile home home.  Home access is 6Stairs to enter with R rail.  Patient will benefit from skilled PT intervention to maximize safe functional mobility, minimize fall risk and decrease caregiver burden for planned discharge home with 24 hour supervision.  Anticipate patient will benefit from follow up [place] at discharge.  PT - End of Session Activity Tolerance: Tolerates 30+ min activity with multiple rests Endurance Deficit: Yes Endurance Deficit Description: 2/2 fatigue PT Assessment Rehab Potential (ACUTE/IP ONLY): Good PT Patient demonstrates impairments in the following area(s): Balance;Endurance;Motor;Safety;Sensory PT Transfers Functional Problem(s): Bed Mobility;Bed to Chair;Car;Furniture PT Locomotion Functional Problem(s): Wheelchair Mobility;Ambulation;Stairs PT Plan PT Intensity: Minimum of 1-2 x/day ,45 to 90 minutes PT Frequency: 5 out of 7 days PT Duration Estimated Length of Stay: 2.5 - 3 weeks PT Treatment/Interventions: Ambulation/gait training;Community reintegration;DME/adaptive equipment instruction;Neuromuscular re-education;Psychosocial support;Stair training;UE/LE Strength taining/ROM;Wheelchair propulsion/positioning;UE/LE Coordination activities;Therapeutic Activities;Pain management;Discharge planning;Balance/vestibular training;Disease management/prevention;Functional mobility training;Cognitive remediation/compensation;Patient/family education;Splinting/orthotics;Therapeutic Exercise PT Transfers Anticipated Outcome(s): supervision with LRAD PT Locomotion Anticipated Outcome(s): supervision for ambulation, min assist stair negotiation PT Recommendation Recommendations for Other Services: Speech consult;Neuropsych consult;Therapeutic Recreation consult Therapeutic Recreation Interventions: Pet therapy;Stress management Follow Up Recommendations: Home health PT;24 hour supervision/assistance Patient destination: Home Equipment Recommended: Rolling walker with 5"  wheels  Skilled Therapeutic Intervention Pt received in bed with family present for first few minutes of session. PT evaluation initiated with therapist educating pt & family on weekly interdisciplinary team meeting, daily therapy schedule, safety plan, and other CIR information. Pt very emotional and upset over situation reporting it is difficult being the one cared for instead of being the caregiver. Pt's family and therapist provided therapeutic listening, emotional support, and encouragement. Transported pt to gym via w/c total assist and manual testing completed. Pt ambulated 5 ft with mod assist + w/c follow requiring facilitation for weight shifting and advancement of RLE 2/2 significant weakness in extremity. Pt completes sit>stand and stand pivot transfers with min assist, but mod assist for stand>sit 2/2 poor eccentric control. Pt completed a car transfer from equal seat height with mod assist to transfer LE in/out of car. At end of session pt left sitting in recliner in room with BLE elevated & all needs within reach.   PT Evaluation Precautions/Restrictions Precautions Precautions: Fall Precaution Comments: monitor HR Restrictions Weight Bearing Restrictions: No  General Chart Reviewed: Yes Additional Pertinent History: asthma, DM 2, chronic pain, depression, HTN, morbid obesity Response to Previous Treatment: Patient reporting fatigue but able to participate. Family/Caregiver Present: Yes (husband & children present for first few minutes of session)  Vital Signs Therapy Vitals Pulse Rate: 91 (after ambulating 5 ft with RW ) Oxygen Therapy SpO2: 100 % O2 Device: Not Delivered  Pain Pain Assessment Pain Assessment:  (reports pain in throat) Pain Intervention(s):  (RN reports pt is premedicated)  Home Living/Prior Functioning Home Living Available Help at Discharge: Family;Available 24 hours/day Type of Home: Mobile home Home Access: Stairs to enter Entrance  Stairs-Number of Steps: 6 Entrance Stairs-Rails: Right Home Layout: One level  Lives With: Spouse;Family (husband, 2 daugthers) Prior Function Level of Independence: Needs assistance with ADLs;Independent with gait;Needs assistance with homemaking;Needs assistance with gait;Needs assistance with tranfers (pt reports she's needed help for  about a year with transfers, getting dressed, getting OOB 2/2 back pain)  Able to Take Stairs?: Yes Driving: No Comments: pt spent a lot of time watching tv  Vision/Perception  Pt reports she wears reading glasses at baseline but needs to get glasses for all the time from the dr. Pt denies any changes in baseline vision.    Cognition Overall Cognitive Status: Difficult to assess Arousal/Alertness: Awake/alert Orientation Level: Oriented X4  Sensation Sensation Light Touch: Impaired by gross assessment (decreased sensation RLE) Proprioception: Appears Intact (BLE) Coordination Gross Motor Movements are Fluid and Coordinated: No  Motor  Motor Motor:  (general weakness, RLE weaker than LLE)   Mobility Transfers Transfers: Yes Sit to Stand: 4: Min assist;With armrests Stand Pivot Transfers: 4: Min assist;3: Mod assist (recliner>w/c, mod assist for eccentric lowering) Stand Pivot Transfer Details: Tactile cues for placement;Tactile cues for posture;Tactile cues for weight shifting;Verbal cues for precautions/safety;Verbal cues for sequencing  Locomotion  Ambulation Ambulation: Yes Ambulation/Gait Assistance: 3: Mod assist (mod assist + w/c follow) Ambulation Distance (Feet): 5 Feet Assistive device: Rolling walker Ambulation/Gait Assistance Details: Manual facilitation for placement;Verbal cues for sequencing Gait Gait: Yes Gait Pattern: Decreased step length - left;Decreased step length - right;Decreased stride length;Decreased weight shift to left;Decreased weight shift to right;Decreased dorsiflexion - right (decreased hip/knee flexion  during swing phase RLE) Stairs / Additional Locomotion Stairs: No Wheelchair Mobility Wheelchair Mobility: No   Trunk/Postural Assessment  Postural Control Postural Control: Deficits on evaluation   Balance Balance Balance Assessed: Yes Dynamic Standing Balance Dynamic Standing - Balance Support: Bilateral upper extremity supported Dynamic Standing - Level of Assistance: 3: Mod assist Dynamic Standing - Balance Activities:  (during gait)  Extremity Assessment  RLE Assessment RLE Assessment: Exceptions to [facility] (2-/5 knee extension, 1/5 hip flexion, 1/5 ankle dorsiflexion) LLE Assessment LLE Assessment: Exceptions to [facility] (3/5 knee extension, 2/5 hip flexion, 2/5 ankle dorsiflexion)   See Function Navigator for Current Functional Status.   Refer to Care Plan for Long Term Goals  Recommendations for other services: Neuropsych and Therapeutic Recreation  Pet therapy and Stress management  Discharge Criteria: Patient will be discharged from PT if patient refuses treatment 3 consecutive times without medical reason, if treatment goals not met, if there is a change in medical status, if patient makes no progress towards goals or if patient is discharged from hospital.  The above assessment, treatment plan, treatment alternatives and goals were discussed and mutually agreed upon: by patient  Waunita Schooner 02/28/2017, 12:46 PM

## 2017-02-28 NOTE — Evaluation (Signed)
Speech Language Pathology Assessment and Plan  Patient Details  Name: [name] MRN: [id number] Date of Birth: 1970/11/10  SLP Diagnosis: Dysphagia;Cognitive Impairments;Voice disorder  Rehab Potential: Excellent ELOS: 15-18 (potentially less for SLP)    Today's Date: 02/28/2017 SLP Individual Time: 0263-7858 SLP Individual Time Calculation (min): 45 min and Today's Date: 02/28/2017 SLP Missed Time: 15 Minutes Missed Time Reason: Other (Comment) (breathing treatments) x3   Problem List:  Patient Active Problem List   Diagnosis Date Noted  . Hypoalbuminemia due to protein-calorie malnutrition ([place])   . Benign essential HTN   . HCAP (healthcare-associated pneumonia)   . Severe asthma with acute exacerbation   . Critical illness myopathy 02/27/2017  . Acute on chronic respiratory failure with hypoxemia ([place])   . AKI (acute kidney injury) ([place])   . Diabetes mellitus type 2 in obese ([place])   . Morbid obesity ([place])   . Chronic pain syndrome   . Leukocytosis   . Critical illness polyneuropathy ([place])   . Acute renal failure ([place])   . Gastritis and gastroduodenitis 02/22/2016  . Hyperlipidemia 02/22/2016  . Depression 02/22/2016  . Migraine 01/30/2015  . Headache 01/30/2015  . URI (upper respiratory infection) 06/04/2014  . Atypical chest pain 02/26/2014  . DM type 2 (diabetes mellitus, type 2) ([place]) 02/26/2014  . Stable asthma 02/26/2014  . Chest pain 11/10/2013  . Hypertensive urgency 11/10/2013  . Back pain 09/05/2013  . Essential hypertension 09/05/2013  . Acute bronchitis 05/01/2013  . Asthma exacerbation 04/27/2013  . Acute respiratory failure ([place]) 04/27/2013  . Uncontrolled diabetes mellitus ([place]) 04/27/2013  . Obesity 04/27/2013  . Allergic rhinitis, seasonal 08/11/2012  . Chronic cough 08/11/2012  . Sleep apnea, obstructive 12/04/2011   Past Medical History:  Past Medical History:  Diagnosis Date  . Arthritis   . Asthma   . Chronic back pain    . Chronic chest pain   . Depression   . DM (diabetes mellitus) ([place])    INSULIN DEPENDENT  . GERD (gastroesophageal reflux disease)   . Headache(784.0)   . Hypertension   . Hypokalemia    Past Surgical History:  Past Surgical History:  Procedure Laterality Date  . APPENDECTOMY    . CESAREAN SECTION     x 3  . CHOLECYSTECTOMY N/A 03/02/2014   Procedure: LAPAROSCOPIC CHOLECYSTECTOMY;  Surgeon: Joyice Faster. Cornett, MD;  Location: [place];  Service: General;  Laterality: N/A;  . HERNIA REPAIR    . ROTATOR CUFF REPAIR    . VESICOVAGINAL FISTULA CLOSURE W/ TAH  2009    Assessment / Plan / Recommendation Clinical Impression [name] is a 46 y.o. female with history of asthma, tobacco use, T2DM, chronic pain, HA; who was admitted on 02/18/17 with asthma exacerbation requiring intubation. History taken from chart review and multiple family members. She was treated with steroids and antibiotics with improvement and tolerated extubation on 6/5. She developed AKI requiring brief CRRT/HD thorough 6/4 and is showing improvement in SCr and UOP. CP/SOB due to demand ischemia and 2 D echo showed EF 65-70% with grade I diastolic dysfunction. She developed fever on 6/3 and respiratory cultures positive for moraxella and sputum with rhinovirus. She was started on Zithromax for treatment of HCAP. Hyperglycemia being managed with SSI and BP medications being titrated upwards for tighter control. Therapy initiated and patient with significant deficits in mobility and ability to carry out ADL tasks due to critical illness polyneuropathy. CIR was recommended for follow up therapy and patient admitted  02/27/17.   Cognitive-linguistic and bedside swallow evaluations complete.  Oral motor exam was unremarkable. However, oral and pharyngeal phases of swallow are limited due to reports of severe pain, described as burning.  Therefore, limited PO was observed today; chart reviewed and patient appears to  have been tolerating current diet on previous venue of care.  As a result, will plan follow closely and discussed findings with RN.  Patient's language is intact, speech in intelligible, but vocal quality remains hoarse with low intensity after 6 days of intubation.  Montreal Cognitive Assessment 7.1 administered and patient demonstrated skills consistent with a score of 25/30 with 26 or greater considered to be [facility].  Prior to admission patient was fully independent and now reports changes in high-level problem solving as well as recall of new information. These high-level deficits impact the patient's overall ability to return to her full independence.  Patient would benefit from skilled SLP intervention in order to maximize her functional independence prior to discharge. Hopeful that patient will be Mod I at discharge.     Skilled Therapeutic Interventions          Bedside swallow and cognitive-linguistic evaluation completed with results and recommendations reviewed with patient.     SLP Assessment  Patient will need skilled Speech Lanaguage Pathology Services during CIR admission    Recommendations  SLP Diet Recommendations: Age appropriate regular solids;Thin Liquid Administration via: Cup;Straw Medication Administration: Whole meds with liquid Supervision: Patient able to self feed Compensations: Slow rate;Small sips/bites Postural Changes and/or Swallow Maneuvers: Seated upright 90 degrees Oral Care Recommendations: Oral care BID Patient destination: Home Follow up Recommendations: None Equipment Recommended: None recommended by SLP    SLP Frequency 3 to 5 out of 7 days   SLP Duration  SLP Intensity  SLP Treatment/Interventions 15-18 (potnetially less for SLP)  Minumum of 1-2 x/day, 30 to 90 minutes  Cognitive remediation/compensation;Cueing hierarchy;Dysphagia/aspiration precaution training;Environmental controls;Functional tasks;Internal/external aids;Medication  managment;Patient/family education;Speech/Language facilitation;Other (comment) (vocal hygiene education )    Pain Pain Assessment Pain Assessment:  (reports pain in throat) Pain Score: 0-No pain Pain Type: Acute pain Pain Location: Throat Pain Descriptors / Indicators: Aching Pain Frequency: Constant Pain Onset: On-going Patients Stated Pain Goal: 0 Pain Intervention(s):  (RN reports pt is premedicated) Multiple Pain Sites: No  Prior Functioning Cognitive/Linguistic Baseline: Within functional limits Type of Home: Mobile home  Lives With: Spouse;Family Available Help at Discharge: Family;Available 24 hours/day Education: 11th grade  Function:  Eating Eating   Modified Consistency Diet: No Eating Assist Level: More than reasonable amount of time;Swallowing techniques: self managed           Cognition Comprehension Comprehension assist level: Follows complex conversation/direction with extra time/assistive device  Expression   Expression assist level: Expresses complex ideas: With extra time/assistive device  Social Interaction Social Interaction assist level: Interacts appropriately with others - No medications needed.  Problem Solving Problem solving assist level: Solves basic problems with no assist  Memory Memory assist level: Recognizes or recalls 75 - 89% of the time/requires cueing 10 - 24% of the time   Short Term Goals: Week 1: SLP Short Term Goal 1 (Week 1): Paitent will demonstrate timely mastication and oral clearance of regular textures with use of no more than 1 thin liquid wash Mod I.    SLP Short Term Goal 2 (Week 1): Patient will recall new complex information such as medications, functions, and frequencies with Sueprvision level question cues.  SLP Short Term Goal 3 (Week 1): Patient  will demonstrate self-monitoring and correcting of errors during complex problem solving tasks with Supervision level question cues.  SLP Short Term Goal 4 (Week 1): Patient  will recall vocal hygiene procedures/recommendations with Supervision level question cues.    Refer to Care Plan for Long Term Goals  Recommendations for other services: None   Discharge Criteria: Patient will be discharged from SLP if patient refuses treatment 3 consecutive times without medical reason, if treatment goals not met, if there is a change in medical status, if patient makes no progress towards goals or if patient is discharged from hospital.  The above assessment, treatment plan, treatment alternatives and goals were discussed and mutually agreed upon: by patient  Carmelia Roller., [place]  [place] 02/28/2017, 12:36 PM

## 2017-02-28 NOTE — Plan of Care (Signed)
Problem: RH Balance Goal: LTG Patient will maintain dynamic standing balance (PT) LTG:  Patient will maintain dynamic standing balance with assistance during mobility activities (PT) With LRAD  Problem: RH Bed to Chair Transfers Goal: LTG Patient will perform bed/chair transfers w/assist (PT) LTG: Patient will perform bed/chair transfers with assistance, with/without cues (PT). With LRAD  Problem: RH Car Transfers Goal: LTG Patient will perform car transfers with assist (PT) LTG: Patient will perform car transfers with assistance (PT). With LRAD  Problem: RH Furniture Transfers Goal: LTG Patient will perform furniture transfers w/assist (OT/PT LTG: Patient will perform furniture transfers  with assistance (OT/PT). With LRAD  Problem: RH Ambulation Goal: LTG Patient will ambulate in controlled environment (PT) LTG: Patient will ambulate in a controlled environment, # of feet with assistance (PT). 100 ft with LRAD Goal: LTG Patient will ambulate in home environment (PT) LTG: Patient will ambulate in home environment, # of feet with assistance (PT). 24 ft with LRAD  Problem: RH Stairs Goal: LTG Patient will ambulate up and down stairs w/assist (PT) LTG: Patient will ambulate up and down # of stairs with assistance (PT) 6 steps with R rail for home access

## 2017-02-28 NOTE — Evaluation (Addendum)
Occupational Therapy Assessment and Plan  Patient Details  Name: [name] MRN: [id number] Date of Birth: 11/24/1970  OT Diagnosis: acute pain, lumbago (low back pain), muscle weakness (generalized) and swelling of limb Rehab Potential:   ELOS: 18-21   Today's Date: 02/28/2017 OT Individual Time: 2010-0712 OT Individual Time Calculation (min): 72 min     Problem List:  Patient Active Problem List   Diagnosis Date Noted  . Hypoalbuminemia due to protein-calorie malnutrition ([place])   . Benign essential HTN   . HCAP (healthcare-associated pneumonia)   . Severe asthma with acute exacerbation   . Critical illness myopathy 02/27/2017  . Acute on chronic respiratory failure with hypoxemia ([place])   . AKI (acute kidney injury) ([place])   . Diabetes mellitus type 2 in obese ([place])   . Morbid obesity ([place])   . Chronic pain syndrome   . Leukocytosis   . Critical illness polyneuropathy ([place])   . Acute renal failure ([place])   . Gastritis and gastroduodenitis 02/22/2016  . Hyperlipidemia 02/22/2016  . Depression 02/22/2016  . Migraine 01/30/2015  . Headache 01/30/2015  . URI (upper respiratory infection) 06/04/2014  . Atypical chest pain 02/26/2014  . DM type 2 (diabetes mellitus, type 2) ([place]) 02/26/2014  . Stable asthma 02/26/2014  . Chest pain 11/10/2013  . Hypertensive urgency 11/10/2013  . Back pain 09/05/2013  . Essential hypertension 09/05/2013  . Acute bronchitis 05/01/2013  . Asthma exacerbation 04/27/2013  . Acute respiratory failure ([place]) 04/27/2013  . Uncontrolled diabetes mellitus ([place]) 04/27/2013  . Obesity 04/27/2013  . Allergic rhinitis, seasonal 08/11/2012  . Chronic cough 08/11/2012  . Sleep apnea, obstructive 12/04/2011    Past Medical History:  Past Medical History:  Diagnosis Date  . Arthritis   . Asthma   . Chronic back pain   . Chronic chest pain   . Depression   . DM (diabetes mellitus) ([place])    INSULIN DEPENDENT  . GERD  (gastroesophageal reflux disease)   . Headache(784.0)   . Hypertension   . Hypokalemia    Past Surgical History:  Past Surgical History:  Procedure Laterality Date  . APPENDECTOMY    . CESAREAN SECTION     x 3  . CHOLECYSTECTOMY N/A 03/02/2014   Procedure: LAPAROSCOPIC CHOLECYSTECTOMY;  Surgeon: Joyice Faster. Cornett, MD;  Location: [facility];  Service: General;  Laterality: N/A;  . HERNIA REPAIR    . ROTATOR CUFF REPAIR    . VESICOVAGINAL FISTULA CLOSURE W/ TAH  2009    Assessment & Plan Clinical Impression: [name] is a 46 y.o. female with history of asthma, tobacco use, T2DM, chronic pain, HA; who was admitted on 02/18/17 with asthma exacerbation requiring intubation. History taken from chart review and multiple family members. She was treated with steroids and antibiotics with improvement and tolerated extubation on 6/5. She developed AKI requiring brief CRRT/HD thorough 6/4 and is showing improvement in SCr and UOP. CP/SOB due to demand ischemia and 2 D echo showed EF 65-70% with grade I diastolic dysfunction. She developed fever on 6/3 and respiratory cultures positive for moraxella and sputum with rhinovirus. She was started on Zithromax for treatment of HCAP. Hyperglycemia being managed with SSI and BP medications being titrated upwards for tighter control. Therapy initiated and patient with significant deficits in mobility and ability to carry out ADL tasks due to critical illness polyneuropathy. CIR was recommended for follow up therapy. .    Patient currently requires min  with basic self-care skills secondary to  muscle weakness, decreased cardiorespiratoy endurance and decreased problem solving and decreased safety awareness.  Prior to hospitalization, patient could complete BADL/IADL with modified independent .  Patient will benefit from skilled intervention to increase independence with basic self-care skills prior to discharge home with care partner.  Anticipate  patient will require 24 hour supervision and follow up home health.  OT - End of Session Endurance Deficit: Yes OT Assessment Barriers to Discharge: Inaccessible home environment OT Patient demonstrates impairments in the following area(s): Balance;Edema;Endurance;Safety;Pain OT Basic ADL's Functional Problem(s): Grooming;Bathing;Dressing;Toileting OT Advanced ADL's Functional Problem(s): Simple Meal Preparation OT Transfers Functional Problem(s): Tub/Shower;Toilet OT Plan OT Intensity: Minimum of 1-2 x/day, 45 to 90 minutes OT Frequency: 5 out of 7 days OT Duration/Estimated Length of Stay: 18-21 OT Treatment/Interventions: Balance/vestibular training;Community reintegration;Discharge planning;Disease mangement/prevention;DME/adaptive equipment instruction;Functional mobility training;Pain management;Patient/family education;Self Care/advanced ADL retraining;Therapeutic Activities;UE/LE Coordination activities;Therapeutic Exercise;UE/LE Strength taining/ROM OT Self Feeding Anticipated Outcome(s): MOD I OT Basic Self-Care Anticipated Outcome(s): S-MOD I OT Toileting Anticipated Outcome(s): S OT Bathroom Transfers Anticipated Outcome(s): S OT Recommendation Recommendations for Other Services: Therapeutic Recreation consult Therapeutic Recreation Interventions: [place] group Patient destination: Home Follow Up Recommendations: Home health OT Equipment Recommended: To be determined   Skilled Therapeutic Intervention 1:1. Pain reported in throat. RN aware. Pt sitting EOB upon arrival feeding self breakfast. Educated pt on role/purpose of OT, ELOS, CIR, and POC. Pt agreeable to bathe and dress at shower level. Pt stand pivot transfer with MIN A EOB>w/c>TTB>w/c>recliner with Vc for safety awareness and RW management. Pt with 1 LOB posteriorly while standing with MOD A to correct. Pt bathes sit to stand level on TTB with A for washing back, B feet and balance while washing buttocks. Pt requires  multiple rest breaks throughout shower 2/2 fatigue. PT dresses in w/c at sink with VC and A to assume seated figure 4 to thread pants and socks. Pt requires A to pull bra and shirt down over trunk 2/2 to fatigue and straps twisting. Pt declines opportunity to stand to brush teeth d/t significant fatigue. Exited session with pt seated in recliner with call light in reach and all needs met.    OT Evaluation Precautions/Restrictions  Precautions Precautions: Fall Restrictions Weight Bearing Restrictions: No General Chart Reviewed: Yes Vital Signs Oxygen Therapy SpO2: 100 % O2 Device: Not Delivered Pain Pain Assessment Pain Assessment:  (reports pain in throat) Pain Score: 0-No pain Pain Type: Acute pain Pain Location: Throat Pain Descriptors / Indicators: Aching Pain Frequency: Constant Pain Onset: On-going Patients Stated Pain Goal: 0 Pain Intervention(s):  (RN reports pt is premedicated) Multiple Pain Sites: No Home Living/Prior Functioning Home Living Family/patient expects to be discharged to:: Private residence Living Arrangements: Spouse/significant other Available Help at Discharge: Family, Available 24 hours/day Type of Home: House Home Access: Stairs to enter Technical brewer of Steps: several Entrance Stairs-Rails: Right Home Layout: One level Bathroom Shower/Tub: Walk-in shower, Door (shower seat; removable shower head) Biochemist, clinical: Standard  Lives With: Spouse IADL History Homemaking Responsibilities: Yes Meal Prep Responsibility: Primary Laundry Responsibility: Primary Cleaning Responsibility: Primary Bill Paying/Finance Responsibility: Primary Shopping Responsibility: Primary Child Care Responsibility: Secondary Current License: No Mode of Transportation: Family Occupation: On disability Leisure and Hobbies: watch tv-lifetime movies; hang out with kids/grandkids Prior Function Level of Independence: Independent with basic ADLs, Independent with  homemaking with ambulation (pain in back giving trouble PTA)  Able to Take Stairs?: Yes Driving: No ADL   Vision Patient Visual Report: No change from baseline Perception  Perception: Within Functional Limits  Praxis Praxis: Intact Cognition Overall Cognitive Status: Within Functional Limits for tasks assessed Arousal/Alertness: Awake/alert Orientation Level: Person;Place;Situation Person: Oriented Place: Oriented Situation: Oriented Year: 2018 Month: July Day of Week: Correct Memory: Appears intact Immediate Memory Recall: Sock;Blue;Bed Memory Recall: Blue;Bed Memory Recall Blue: Without Cue Memory Recall Bed: Without Cue Attention: Selective Selective Attention: Appears intact Sensation Sensation Light Touch: Appears Intact Hot/Cold: Appears Intact Proprioception: Appears Intact Coordination Gross Motor Movements are Fluid and Coordinated: No Fine Motor Movements are Fluid and Coordinated: No Motor  Motor Motor - Skilled Clinical Observations: generalized weakness Mobility  Transfers Transfers: Sit to Stand Sit to Stand: 4: Min assist  Trunk/Postural Assessment  Cervical Assessment Cervical Assessment: Within Functional Limits Thoracic Assessment Thoracic Assessment: Exceptions to [facility] (rounded shoulders) Lumbar Assessment Lumbar Assessment: Exceptions to [facility] (posterior pelvic tilt) Postural Control Postural Control: Deficits on evaluation (delayed)  Balance Balance Balance Assessed: Yes Dynamic Sitting Balance Dynamic Sitting - Balance Support: Feet supported Dynamic Sitting - Level of Assistance: 5: Stand by assistance Dynamic Sitting Balance - Compensations: bathing Dynamic Standing Balance Dynamic Standing - Balance Support: Left upper extremity supported Dynamic Standing - Level of Assistance: 4: Min assist Dynamic Standing - Comments: dressing Extremity/Trunk Assessment RUE Assessment RUE Assessment: Exceptions to [facility] (2+/5) LUE Assessment LUE  Assessment: Exceptions to [facility] (2+/5)   See Function Navigator for Current Functional Status.   Refer to Care Plan for Long Term Goals  Recommendations for other services: Therapeutic Recreation  Kitchen group   Discharge Criteria: Patient will be discharged from OT if patient refuses treatment 3 consecutive times without medical reason, if treatment goals not met, if there is a change in medical status, if patient makes no progress towards goals or if patient is discharged from hospital.  The above assessment, treatment plan, treatment alternatives and goals were discussed and mutually agreed upon: by patient  Tonny Branch 02/28/2017, 10:35 AM

## 2017-02-28 NOTE — Progress Notes (Addendum)
[place] PHYSICAL MEDICINE & REHABILITATION     PROGRESS NOTE  Subjective/Complaints:  Pt seen sitting at the edge of the bed this AM.  She slept well overnight.  She is ready to begin therapies.  ROS: +SOB. Denies CP, N/V/D.  Objective: Vital Signs: Blood pressure (!) 157/96, pulse 97, temperature 99.3 F (37.4 C), temperature source Tympanic, resp. rate 18, height 5\' 1"  (1.549 m), weight 105.7 kg (233 lb), SpO2 100 %. Dg Chest 1 View  Result Date: 02/27/2017 CLINICAL DATA:  Cough. EXAM: CHEST 1 VIEW COMPARISON:  02/25/2017. FINDINGS: Cardiomegaly with normal pulmonary vascularity. Low lung volumes with mild basilar atelectasis. Interim resolution of left pleural effusion . No pneumothorax. Degenerative changes thoracic spine . IMPRESSION: Stable cardiomegaly. Low lung volumes with mild basilar atelectasis . Interim resolution of left pleural effusion . Electronically Signed   By: Marcello Moores  Register   On: 02/27/2017 09:54    Recent Labs  02/27/17 0452 02/28/17 0427  WBC 17.8* 17.5*  HGB 11.1* 10.5*  HCT 33.5* 32.6*  PLT 343 345    Recent Labs  02/27/17 0452 02/28/17 0427  NA 135 135  K 3.6 3.8  CL 102 105  GLUCOSE 184* 239*  BUN 35* 25*  CREATININE 1.38* 1.17*  CALCIUM 8.7* 8.3*   CBG (last 3)   Recent Labs  02/27/17 1645 02/27/17 2117 02/28/17 0640  GLUCAP 175* 76 228*    Wt Readings from Last 3 Encounters:  02/28/17 105.7 kg (233 lb)  02/27/17 105.7 kg (233 lb 0.4 oz)  01/05/17 108.7 kg (239 lb 9.6 oz)    Physical Exam:  BP (!) 157/96 (BP Location: Right Arm)   Pulse 97   Temp 99.3 F (37.4 C) (Tympanic)   Resp 18   Ht 5\' 1"  (1.549 m)   Wt 105.7 kg (233 lb)   SpO2 100%   BMI 44.02 kg/m  Constitutional: She appears well-developed. Obese. NAD. HENT: Normocephalic and atraumatic.  Eyes: EOMI. No discharge.  Cardiovascular: Normal rate and regular rhythm. No JVD. Respiratory: Increased WOB. No stridor.  GI: Soft. Bowel sounds are normal.   Musculoskeletal: She exhibits edema and tenderness.  Neurological: She is alert and oriented.  Motor: 4-/5 throughout Decreased sensation in stocking glove distribution from upper calves distally.  Skin: Skin is warm and dry.  Psychiatric: Her speech is normal. Her mood appears anxious. She is agitated. Cognition and memory are not impaired.    Assessment/Plan: 1. Functional deficits secondary to critical illness polyneuropathy which require 3+ hours per day of interdisciplinary therapy in a comprehensive inpatient rehab setting. Physiatrist is providing close team supervision and 24 hour management of active medical problems listed below. Physiatrist and rehab team continue to assess barriers to discharge/monitor patient progress toward functional and medical goals.  Function:  Bathing Bathing position      Bathing parts      Bathing assist        Upper Body Dressing/Undressing Upper body dressing                    Upper body assist        Lower Body Dressing/Undressing Lower body dressing                                  Lower body assist        Toileting Toileting          Toileting assist  Transfers Chair/bed Physiological scientist Comprehension    Expression    Social Interaction    Problem Solving    Memory        Medical Problem List and Plan:  1. Functional and mobility deficits secondary to critical illness polyneuropathy   Cont CIR 2. DVT Prophylaxis/Anticoagulation: Pharmaceutical: Lovenox  3. Chronic back pain with radiculopathy/Pain Management: IV fentanyl discontinued.   Hydrocodone---pt took this at home q6 prn  4. Mood: team to provide ego support. Continue zoloft. LCSW to follow for evaluation and support.  5. Neuropsych: This patient is capable of making decisions on her own behalf.  6. Skin/Wound Care: Routine pressure relief measures.   7. Fluids/Electrolytes/Nutrition: Monitor I/Os.  8. Asthma exacerbation: Continues on steroids for 2 additional days. Continue brovana and duonebs. Encourage aggressive pulmonary hygiene.  9. Moraxella HCAP: completes Zithromax today.  10. AKI: Avoid nephrotoxic drugs  Cr 1.17 on 6/9  Cont to monitor 11.HTN: Monitor BP bid.  On losartan and Norvasc   Monitor with increased activity 12. T2DM: Monitor BS ac/hs. Resumed prandin and dulaglutide.   Monitor with increased mobility, extremely labile at present 13. Morbid obesity: diet education for weight loss and to promoted over all health.  14. Leucocytosis: Likely reactive due to infection and steroids. Continue to monitor.   WBCs 17.5 on 6/9  Afebrile  Cont to monitor 15. Severe Throat pain: Added Carafate ac/hs with MMW prn. Question thrush--will monitor.  16. Migraines: has been refusing Topamax. Continue Imitrex prn.  17. Neuropathy: Increased gabapentin to tid to help manage dysesthesias RUE  18. Hypoalbuminemia  Supplement initiated 6/9   LOS (Days) 1 A FACE TO FACE EVALUATION WAS PERFORMED  Ankit Lorie Phenix 02/28/2017 7:47 AM

## 2017-03-01 ENCOUNTER — Inpatient Hospital Stay (HOSPITAL_COMMUNITY): Payer: Medicare Other

## 2017-03-01 DIAGNOSIS — R0602 Shortness of breath: Secondary | ICD-10-CM

## 2017-03-01 DIAGNOSIS — R042 Hemoptysis: Secondary | ICD-10-CM

## 2017-03-01 LAB — BASIC METABOLIC PANEL
Anion gap: 10 (ref 5–15)
BUN: 20 mg/dL (ref 6–20)
CHLORIDE: 104 mmol/L (ref 101–111)
CO2: 22 mmol/L (ref 22–32)
CREATININE: 1.05 mg/dL — AB (ref 0.44–1.00)
Calcium: 8.7 mg/dL — ABNORMAL LOW (ref 8.9–10.3)
Glucose, Bld: 88 mg/dL (ref 65–99)
Potassium: 3.4 mmol/L — ABNORMAL LOW (ref 3.5–5.1)
SODIUM: 136 mmol/L (ref 135–145)

## 2017-03-01 LAB — URINE CULTURE: Culture: >=100000 — AB

## 2017-03-01 LAB — GLUCOSE, CAPILLARY
GLUCOSE-CAPILLARY: 95 mg/dL (ref 65–99)
Glucose-Capillary: 176 mg/dL — ABNORMAL HIGH (ref 65–99)
Glucose-Capillary: 212 mg/dL — ABNORMAL HIGH (ref 65–99)

## 2017-03-01 MED ORDER — PHENOL 1.4 % MT LIQD: 1.0000 | OROMUCOSAL | Status: DC | PRN

## 2017-03-01 MED ORDER — PANTOPRAZOLE SODIUM 40 MG PO TBEC
40.0000 mg | DELAYED_RELEASE_TABLET | Freq: Two times a day (BID) | ORAL | Status: DC
  Administered 2017-03-01 – 2017-03-03 (×5): 40 mg via ORAL
  Filled 2017-03-01 (×5): qty 1

## 2017-03-01 MED ORDER — MENTHOL 3 MG MT LOZG
1.0000 | LOZENGE | OROMUCOSAL | Status: DC | PRN
  Filled 2017-03-01: qty 9

## 2017-03-01 NOTE — Progress Notes (Signed)
At around 12 am patient complained of difficulty breathing when she put her head down and that it feels like there is something in her throat. She was also coughing up blood. Upon assessment, her O2 sat was 100% on RA but heard something that appears to be like stridor. On call notified, resp. Call to eval. Pt. RR also called and both ruled out stridor. On call ordered chest X-ray and Chloraseptic spray, order carried out per protocol. RR recommended moist humidification and resp. Initiated that. At around 1:30 am patient was able to go to sleep. Will continue to monitor.

## 2017-03-01 NOTE — Progress Notes (Signed)
Pt c/o sore throat and coughing blood.  Pt appears to be making a wheezing noise.  Nebulizer given.  She states that breathing treatments does not help.  Pt is on ipad with no distress upon entering and leaving room.  SPO2 on RA 98%.  Unable to accurately ascultate BBS what appears to be self induced wheezing.  Talked with RN she will address medications available and I will place pt on humidified face mask.  Rapid response is aware.  RN to contact MD

## 2017-03-01 NOTE — Plan of Care (Signed)
Problem: RH BOWEL ELIMINATION Goal: RH STG MANAGE BOWEL W/MEDICATION W/ASSISTANCE STG Manage Bowel with Medication with min. Assistance.  Outcome: Not Progressing LBM 6/7

## 2017-03-01 NOTE — Significant Event (Signed)
Rapid Response Event Note  Overview: Time Called: 0041 Arrival Time: 0043 Event Type: Respiratory  Initial Focused Assessment: Upper airway noise   Interventions: Humidified neb and throat spray  Plan of Care (if not transferred):  Event Summary: Called to evaluate patient for complaint of upper airway stridor. Patients chart was reviewed. No signs of acute distress noted at present. Airway appears patent but coarse upper airway sounds noted on expiration. Lungs with rhonchi but clears with cough. Heart sounds regular. Patient is able to speak complete sentences without difficulty. Neuro intact. Tongue without swelling and uvula is visibile and midline. Recommended moist humidification and throat spray. Spoke with Dr. Posey Pronto and he is aware of patients complaint. We will continue to assist with care as needed.       Eino Farber Hersey

## 2017-03-01 NOTE — Progress Notes (Signed)
[name] PHYSICAL MEDICINE & REHABILITATION     PROGRESS NOTE  Subjective/Complaints:  Pt seen sitting up in bed this AM.  She slept fairly overnight, because she was anxious about hemoptysis.  Spoke with rapid response overnight.  CXR ordered.   ROS: +SOB, hemoptysis. Denies CP, N/V/D.  Objective: Vital Signs: Blood pressure (!) 144/82, pulse 100, temperature 98.4 F (36.9 C), temperature source Oral, resp. rate 18, height 5\' 1"  (1.549 m), weight 101.7 kg (224 lb 3 oz), SpO2 99 %. Dg Chest 1 View  Result Date: 02/27/2017 CLINICAL DATA:  Cough. EXAM: CHEST 1 VIEW COMPARISON:  02/25/2017. FINDINGS: Cardiomegaly with normal pulmonary vascularity. Low lung volumes with mild basilar atelectasis. Interim resolution of left pleural effusion . No pneumothorax. Degenerative changes thoracic spine . IMPRESSION: Stable cardiomegaly. Low lung volumes with mild basilar atelectasis . Interim resolution of left pleural effusion . Electronically Signed   By: Marcello Moores  Register   On: 02/27/2017 09:54   Dg Chest Port 1 View  Result Date: 03/01/2017 CLINICAL DATA:  Hemoptysis. EXAM: PORTABLE CHEST 1 VIEW COMPARISON:  Multiple recent exams most recently 2 days prior 02/27/2017 FINDINGS: The cardiomediastinal contours are unchanged, decreased cardiomegaly. Improved lung aeration from prior. Pulmonary vasculature is normal. No consolidation, pleural effusion, or pneumothorax. No acute osseous abnormalities are seen. IMPRESSION: Improving aeration from prior exams.  No acute abnormality. Electronically Signed   By: Jeb Levering M.D.   On: 03/01/2017 02:06    Recent Labs  02/27/17 0452 02/28/17 0427  WBC 17.8* 17.5*  HGB 11.1* 10.5*  HCT 33.5* 32.6*  PLT 343 345    Recent Labs  02/28/17 0427 03/01/17 0456  NA 135 136  K 3.8 3.4*  CL 105 104  GLUCOSE 239* 88  BUN 25* 20  CREATININE 1.17* 1.05*  CALCIUM 8.3* 8.7*   CBG (last 3)   Recent Labs  02/28/17 1630 02/28/17 2108 03/01/17 0657   GLUCAP 214* 104* 95    Wt Readings from Last 3 Encounters:  02/28/17 101.7 kg (224 lb 3 oz)  02/27/17 105.7 kg (233 lb 0.4 oz)  01/05/17 108.7 kg (239 lb 9.6 oz)    Physical Exam:  BP (!) 144/82 (BP Location: Left Arm)   Pulse 100   Temp 98.4 F (36.9 C) (Oral)   Resp 18   Ht 5\' 1"  (1.549 m)   Wt 101.7 kg (224 lb 3 oz)   SpO2 99%   BMI 42.36 kg/m  Constitutional: She appears well-developed. Obese. NAD. HENT: Normocephalic and atraumatic.  Eyes: EOMI. No discharge.  Cardiovascular: RRR. No JVD. Respiratory: Increased WOB. No stridor.  GI: Soft. Bowel sounds are normal.  Musculoskeletal: She exhibits edema and tenderness.  Neurological: She is alert and oriented.  Motor: 4-/5 throughout (improving). Skin: Skin is warm and dry.  Psychiatric: Her speech is normal. Her mood appears anxious. She is agitated. Cognition and memory are not impaired.    Assessment/Plan: 1. Functional deficits secondary to critical illness polyneuropathy which require 3+ hours per day of interdisciplinary therapy in a comprehensive inpatient rehab setting. Physiatrist is providing close team supervision and 24 hour management of active medical problems listed below. Physiatrist and rehab team continue to assess barriers to discharge/monitor patient progress toward functional and medical goals.  Function:  Bathing Bathing position   Position: Shower  Bathing parts Body parts bathed by patient: Right arm, Left arm, Chest, Abdomen, Front perineal area, Buttocks, Right upper leg, Left upper leg Body parts bathed by helper: Left lower leg,  Back, Right lower leg  Bathing assist Assist Level: Touching or steadying assistance(Pt > 75%)      Upper Body Dressing/Undressing Upper body dressing   What is the patient wearing?: Bra, Pull over shirt/dress Bra - Perfomed by patient: Thread/unthread right bra strap, Thread/unthread left bra strap Bra - Perfomed by helper: Hook/unhook bra (pull down sports  bra) Pull over shirt/dress - Perfomed by patient: Thread/unthread right sleeve, Thread/unthread left sleeve Pull over shirt/dress - Perfomed by helper: Put head through opening        Upper body assist Assist Level: Touching or steadying assistance(Pt > 75%)      Lower Body Dressing/Undressing Lower body dressing   What is the patient wearing?: Pants, Non-skid slipper socks     Pants- Performed by patient: Pull pants up/down, Thread/unthread right pants leg, Thread/unthread left pants leg (A to bring leg to seated figure 4)   Non-skid slipper socks- Performed by patient: Don/doff right sock, Don/doff left sock                    Lower body assist Assist for lower body dressing: Touching or steadying assistance (Pt > 75%)      Toileting Toileting   Toileting steps completed by patient: Performs perineal hygiene Toileting steps completed by helper: Adjust clothing prior to toileting, Adjust clothing after toileting Toileting Assistive Devices: Toilet aid  Toileting assist Assist level: Touching or steadying assistance (Pt.75%)   Transfers Chair/bed transfer   Chair/bed transfer method: Stand pivot Chair/bed transfer assist level: Touching or steadying assistance (Pt > 75%) Chair/bed transfer assistive device: Armrests     Locomotion Ambulation     Max distance: 5 ft  Assist level: 2 helpers (mod assist + w/c follow for safety)   Wheelchair Wheelchair activity did not occur: Safety/medical concerns        Cognition Comprehension Comprehension assist level: Follows complex conversation/direction with no assist  Expression Expression assist level: Expresses basic needs/ideas: With no assist  Social Interaction Social Interaction assist level: Interacts appropriately 90% of the time - Needs monitoring or encouragement for participation or interaction.  Problem Solving Problem solving assist level: Solves basic problems with no assist  Memory Memory assist level:  Recognizes or recalls 90% of the time/requires cueing < 10% of the time      Medical Problem List and Plan:  1. Functional and mobility deficits secondary to critical illness polyneuropathy   Cont CIR 2. DVT Prophylaxis/Anticoagulation: Pharmaceutical: Lovenox  3. Chronic back pain with radiculopathy/Pain Management: IV fentanyl discontinued.   Hydrocodone---pt took this at home q6 prn  4. Mood: team to provide ego support. Continue zoloft. LCSW to follow for evaluation and support.  5. Neuropsych: This patient is capable of making decisions on her own behalf.  6. Skin/Wound Care: Routine pressure relief measures.  7. Fluids/Electrolytes/Nutrition: Monitor I/Os.  8. Asthma exacerbation: Continues on steroids for 1 additional days. Continue brovana and duonebs. Encourage aggressive pulmonary hygiene.  9. Moraxella HCAP: completed Zithromax 6/9.  10. AKI: Avoid nephrotoxic drugs  Cr 1.05 on 6/10  Cont to monitor 11.HTN: Monitor BP bid.  On losartan and Norvasc   Slightly labile, confounded by anxiety 12. T2DM: Monitor BS ac/hs. Resumed prandin and dulaglutide.   Continues to be labile, but appears to be improving 13. Morbid obesity: diet education for weight loss and to promoted over all health.  14. Leucocytosis: Likely reactive due to infection and steroids. Continue to monitor.   WBCs 17.5 on 6/9  Afebrile  Cont to monitor  Labs ordered for tomorrow 15. Severe Throat pain: Added Carafate ac/hs with MMW prn. Question thrush--will monitor.  16. Migraines: has been refusing Topamax. Continue Imitrex prn.  17. Neuropathy: Increased gabapentin to tid to help manage dysesthesias RUE  18. Hypoalbuminemia  Supplement initiated 6/9 19. Hemoptysis  CXR reviewed, improved  Neck xray ordered  PPI increased to BID  Throat spray/lozanges ordered  LOS (Days) 2 A FACE TO FACE EVALUATION WAS PERFORMED  [name] Lorie Phenix 03/01/2017 7:34 AM

## 2017-03-01 NOTE — Progress Notes (Signed)
Occupational Therapy Session Note  Patient Details  Name: [name] MRN: [id number] Date of Birth: 05-17-1971  Today's Date: 03/01/2017 OT Individual Time: 1300-1343 OT Individual Time Calculation (min): 43 min    Short Term Goals: Week 1:  OT Short Term Goal 1 (Week 1): Pt will stand to complete 2/2 grooming items at sink wiht supervision OT Short Term Goal 2 (Week 1): Pt will stand for 5 min to complete functional activity with supervision to imrpove endurance required for BADLs OT Short Term Goal 3 (Week 1): Pt will complete walk in shower transfer with supervision OT Short Term Goal 4 (Week 1): Pt will complete BSC/toilet transfer wiht supervision  Skilled Therapeutic Interventions/Progress Updates:    1:1. No pain reported. Focus of session standing balance, safety awareness, transfers, ambulation with AD, and endurance to improve independence in BADLs. In standing pt crosses midline and reaches laterally to obtain horseshoes and pitch at target with supervision-touching A and VC for posture/foot placement. Pt stands to pass basketball (bounce, chest and overhead pass) with supervision for 4 rounds (30 passes, 40, 20 and 30) to improve endurance with VC for pursed lip breathing during rest breaks. Pt completes ambulatory walk in shower transfer with touching A to simulate home environment with VC for foot clearance. Exited session with pt seated in w/c call light in reach and husband present.   Therapy Documentation Precautions:  Precautions Precautions: Fall Precaution Comments: monitor HR Restrictions Weight Bearing Restrictions: No General:   Vital Signs: Oxygen Therapy O2 Device: Not Delivered Pain: Pain Assessment Pain Score: 0-No pain ADL:   Vision   Perception    Praxis   Exercises:   Other Treatments:    See Function Navigator for Current Functional Status.   Therapy/Group: Individual Therapy  Tonny Branch 03/01/2017, 12:37  PM

## 2017-03-01 NOTE — Progress Notes (Signed)
Patient c/o to nurse tech that she was having difficulty breathing. Pulse Ox 98%on room air. Consulted with Respiratory Therapist who gave patient breathing treatment. Pt c/o breathing treatments do not work. However, upon entering and leaving the room, patient is calmly using her computer and appears in no respiratory distress. Rapid Response and on call provider aware. Will continue to monitor for status changes.

## 2017-03-01 NOTE — Discharge Summary (Signed)
[facility] Discharge Summary  Patient name: [name] Medical record number: [id number] Date of birth: [birth year] Age: 46 y.o. Gender: female Date of Admission: 02/18/2017  Date of Discharge:02/27/2017 Admitting Physician: Rush Farmer, MD  Primary Care Provider: Arnoldo Morale, MD Consultants: Nephrology, Cardiology  Indication for Hospitalization: Asthma exacerbation  Discharge Diagnoses/Problem List:  Asthma HTN HLD T2DM OSA Migraines Depression  Disposition: Discharge to inpatient rehabilitation (CIR)  Discharge Condition: Stable   Discharge Exam:  General: Tired appearing woman, in NAD, able to participate in exam, patient stil with some hoarseness. Cardiac: RRR, normal heart sounds, no murmurs. 2+ radial and PT pulses bilaterally Respiratory: Expiratory wheezing bilaterally, good air movement and no increase work of breathing. Worsening cough. Abdomen: soft, nontender, nondistended, no hepatic or splenomegaly, +BS Extremities: no edema or cyanosis. WWP. Skin: warm and dry, no rashes noted Neuro: alert and oriented x4, no focal deficits Psych: Normal affect and mood  Brief Hospital Course:  5/30 admitted for asthma exacerbation and intubated 5/31 AKI CRRT/HD 6/5 Extubated 6/6 Transferred out ICU  Patient is a 46 y.o.female with a past medical history significant for asthma, chronic back pain, hyperlipidemia, hypertension, T2DM, obstructive sleep apnea, depression who was admitted on 5/30 with asthma exacerbation requiring intubation and ICU admission after failing conservative management  #Asthma exacerbation, improving Patient was intubated admission 5/30 for respiratory distress with increase WOB. Patient stay on the vent until 6/5 when she was extubated. After extubation, patient has been on RA and maintaining good oxygen saturation. Patient was started on azithromycin for possible HCAP though CXR was equivocal  for infiltrates vs atelectasis after sputum cultures grew Moraxella Catarrhallis. Patient continue to have mild wheezing without increase work of breathing. CXR prior to discharge showed resolution of left pleural effusion. Patient had positive rhinovirus on 5/31. --Continue Brovana neb 15 mcg bid  --Continue prednisone 50 mg daily for 5 days DAY 1 (6/7) --Last day of Azithromycin 500 mg daily ( --Patient will need counseling on smoking cessation  #Chest pain, stable, improved EKG on admission was not concerning and troponins were <0.03. Elevation in troponin to 0.75 concerning but thought to be secondary to demand ischemia. Patient had a TTE which showed EF 65-70% with normal systolic function and J2IN. As patient imroved clinically, troponin contiue t o down trend.   #AKI, acute, resolving Patient kidney function continue to improve. Patient briefly received CRRT/HD while in the ICU for oliguria, worsening kidney function with a creatinine of 2.16 and hyperkalemia 7.5 . Patient kidney function improve during admission, and she was able to make urine by the time of discharge, with creatinine close to baseline and resolved hyperkalemia.  --Follow up on daily Renal Function Panel --Consult nephrology as needed  #T2DM, fairly well controlled A1c on admission (5/31) was 7.4 up from 6.5 on (2/26). Blood glucose have been mildly elevated in the setting of steroid course. Patient has required minimal rapid acting insulin during her admission.  #Hypertension, uncontrolled  Patient BP have been labile and required home med and PRN. Patient's amlodipine was increased from 5 mg to 10 mg with hydralazine prn. BP was more stable by the time of discharge, but continue to be low at times. Patient was discharged on amlodipine 5 mg daily while still holding losartan giving improving kidney function  #GERD Patient was intubated for about a week. Some throat irritation noted expected after prolonged  intubation --Continue pantoprazole 40 mg daily   #HLD  --Continue simvastatin 10 mg daily  #  Constipation --Continue Miralax 17g bid  --Continue Senna-Docusate 1 tab bid  #Depression --Will continue Zoloft 50 mg daily   #Migraines, chronic Patient intermittently complaining of migraines headaches. Initially was offered topomax which she decline due to allergies. Restarted patient on Imitrex.   #Smoking cessation Patient is interested in quitting after discharge    Issues for Follow Up:  1. Patient will need to follow up with PCP about asthma regimen and reevaluation of maintenance plan 2. Patient will need smoking cessation counseling.   Significant Procedures: TTE, CRRT/HD, Intubation  Significant Labs and Imaging:    Recent Labs Lab 02/26/17 0710 02/27/17 0452 02/28/17 0427  WBC 18.5* 17.8* 17.5*  HGB 12.3 11.1* 10.5*  HCT 37.0 33.5* 32.6*  PLT 316 343 345    Recent Labs Lab 02/23/17 0322 02/24/17 0324 02/25/17 0235 02/26/17 0450 02/27/17 0452 02/28/17 0427 03/01/17 0456  NA 136 141 136 134* 135 135 136  K 3.9 3.7 4.7 4.2 3.6 3.8 3.4*  CL 103 104 104 104 102 105 104  CO2 27 27 20* 19* 23 21* 22  GLUCOSE 196* 126* 174* 137* 184* 239* 88  BUN 49* 54* 45* 34* 35* 25* 20  CREATININE 1.38* 1.41* 1.25* 1.17* 1.38* 1.17* 1.05*  CALCIUM 8.2* 8.6* 8.8* 8.8* 8.7* 8.3* 8.7*  MG 2.5*  --   --   --   --   --   --   PHOS 2.7 3.7 5.1* 4.9* 5.5*  --   --   ALKPHOS  --   --   --   --   --  66  --   AST  --   --   --   --   --  17  --   ALT  --   --   --   --   --  26  --   ALBUMIN 2.5* 2.5* 2.5* 2.6* 2.4* 2.4*  --     Dg Neck Soft Tissue  Result Date: 03/01/2017 CLINICAL DATA:  46 year old female with neck pain following recent intubation. EXAM: NECK SOFT TISSUES - 1+ VIEW COMPARISON:  None. FINDINGS: There is no evidence of retropharyngeal soft tissue swelling or epiglottic enlargement. The cervical airway is unremarkable and no radio-opaque foreign body  identified. IMPRESSION: Negative. Electronically Signed   By: Margarette Canada M.D.   On: 03/01/2017 08:04   Dg Chest 1 View  Result Date: 02/27/2017 CLINICAL DATA:  Cough. EXAM: CHEST 1 VIEW COMPARISON:  02/25/2017. FINDINGS: Cardiomegaly with normal pulmonary vascularity. Low lung volumes with mild basilar atelectasis. Interim resolution of left pleural effusion . No pneumothorax. Degenerative changes thoracic spine . IMPRESSION: Stable cardiomegaly. Low lung volumes with mild basilar atelectasis . Interim resolution of left pleural effusion . Electronically Signed   By: Marcello Moores  Register   On: 02/27/2017 09:54   Dg Chest Port 1 View  Result Date: 03/01/2017 CLINICAL DATA:  Hemoptysis. EXAM: PORTABLE CHEST 1 VIEW COMPARISON:  Multiple recent exams most recently 2 days prior 02/27/2017 FINDINGS: The cardiomediastinal contours are unchanged, decreased cardiomegaly. Improved lung aeration from prior. Pulmonary vasculature is normal. No consolidation, pleural effusion, or pneumothorax. No acute osseous abnormalities are seen. IMPRESSION: Improving aeration from prior exams.  No acute abnormality. Electronically Signed   By: Jeb Levering M.D.   On: 03/01/2017 02:06    Results/Tests Pending at Time of Discharge: None  Discharge Medications:  Allergies as of 02/27/2017      Reactions   Reglan [metoclopramide] Other (See Comments)   Panic  attack   Topamax [topiramate]    Per patient, face swells and fingers tingles      Medication List    STOP taking these medications   Fluticasone-Salmeterol 250-50 MCG/DOSE Aepb Commonly known as:  ADVAIR   hydrochlorothiazide 25 MG tablet Commonly known as:  HYDRODIURIL   losartan 100 MG tablet Commonly known as:  COZAAR   naproxen 500 MG tablet Commonly known as:  NAPROSYN   topiramate 100 MG tablet Commonly known as:  TOPAMAX     TAKE these medications   ACCU-CHEK AVIVA PLUS w/Device Kit Use to check blood sugar 3 times per day. DX CODE: E11.9    accu-chek multiclix lancets Use to check blood sugar 3 times per day. DX code E11.9   accu-chek softclix lancets Use to check blood sugar 3 times per day dx code E11.9   acetaminophen-codeine 300-30 MG tablet Commonly known as:  TYLENOL #3 Take 1 tablet by mouth every 12 (twelve) hours as needed for moderate pain.   albuterol 108 (90 Base) MCG/ACT inhaler Commonly known as:  PROVENTIL HFA;VENTOLIN HFA Inhale 2 puffs into the lungs every 6 (six) hours as needed for wheezing or shortness of breath. Reported on 02/22/2016   albuterol (2.5 MG/3ML) 0.083% nebulizer solution Commonly known as:  PROVENTIL Take 3 mLs (2.5 mg total) by nebulization every 6 (six) hours as needed for wheezing or shortness of breath. Reported on 02/22/2016   amLODipine 5 MG tablet Commonly known as:  NORVASC Take 1 tablet (5 mg total) by mouth daily.   arformoterol 15 MCG/2ML Nebu Commonly known as:  BROVANA Take 2 mLs (15 mcg total) by nebulization 2 (two) times daily.   aspirin EC 325 MG tablet Take 325 mg by mouth daily.   bromocriptine 2.5 MG tablet Commonly known as:  PARLODEL 1/4 tab daily What changed:  how much to take  how to take this  when to take this  additional instructions   budesonide 0.5 MG/2ML nebulizer solution Commonly known as:  PULMICORT Take 2 mLs (0.5 mg total) by nebulization 2 (two) times daily.   dapagliflozin propanediol 10 MG Tabs tablet Commonly known as:  FARXIGA Take 10 mg by mouth daily.   Dulaglutide 1.5 MG/0.5ML Sopn Commonly known as:  TRULICITY Inject 1.5 mg into the skin once a week.   gabapentin 300 MG capsule Commonly known as:  NEURONTIN Take 1 capsule (300 mg total) by mouth 3 (three) times daily.   glucose blood test strip Commonly known as:  ACCU-CHEK AVIVA PLUS Use to check blood sugar 3 times per day.DX Code E11.9   Insulin Pen Needle 32G X 4 MM Misc Use to inject insulin 3 times per day.   methocarbamol 500 MG tablet Commonly known as:   ROBAXIN Take 1 tablet (500 mg total) by mouth every 8 (eight) hours as needed for muscle spasms.   olopatadine 0.1 % ophthalmic solution Commonly known as:  PATANOL Place 1 drop into the right eye 2 (two) times daily.   oxyCODONE-acetaminophen 5-325 MG tablet Commonly known as:  PERCOCET Take 1-2 tablets by mouth every 4 (four) hours as needed. What changed:  reasons to take this   pantoprazole 40 MG tablet Commonly known as:  PROTONIX Take 1 tablet (40 mg total) by mouth daily.   predniSONE 50 MG tablet Commonly known as:  DELTASONE Patient will take for 3 more days 50 mg a day   prochlorperazine 10 MG tablet Commonly known as:  COMPAZINE Take 1 tablet (10 mg  total) by mouth 2 (two) times daily as needed for nausea or vomiting.   repaglinide 2 MG tablet Commonly known as:  PRANDIN 1 tab with breakfast and lunch, and 2 tabs with dinner. What changed:  how much to take  how to take this  when to take this  additional instructions   sertraline 50 MG tablet Commonly known as:  ZOLOFT Take 1 tablet (50 mg total) by mouth daily.   simvastatin 10 MG tablet Commonly known as:  ZOCOR Take 1 tablet (10 mg total) by mouth daily at 6 PM.   SUMAtriptan 100 MG tablet Commonly known as:  IMITREX TAKE 1TAB AT EARLIEST ONSET OF HEADACHE. MAY REPEAT X1 IN 2 HOURS IF HEADACHE PERSISTS OR RECURS. DO NOT EXCEED 2 TABS IN 24HRS       Discharge Instructions: Please refer to Patient Instructions section of EMR for full details.  Patient was counseled important signs and symptoms that should prompt return to medical care, changes in medications, dietary instructions, activity restrictions, and follow up appointments.   Follow-Up Appointments:   Marjie Skiff, MD 03/01/2017, 11:28 AM PGY-1, [place]

## 2017-03-01 NOTE — Progress Notes (Signed)
Patient with intermittent distress and wheezes.  See RN and RT notes.  Patient with recent intubation.  Neck soft tissue results noted.  Recommended Benadryl and reapplying humidified O2.  RN to call if assistance needed.

## 2017-03-01 NOTE — Progress Notes (Signed)
RN called RT to evaluate patient. Patient experiencing discomfort in upper airway. Patient complaining of pain when coughing and coughing up small amount of  blood with sputum. Patient's breath sounds are clear bilaterally. Rapid response evaluated patient and spoke with MD. RT set up aerosol mask with humidity for patient. Vitals are stable at this time.

## 2017-03-02 ENCOUNTER — Inpatient Hospital Stay (HOSPITAL_COMMUNITY): Payer: Medicare Other | Admitting: Occupational Therapy

## 2017-03-02 ENCOUNTER — Inpatient Hospital Stay (HOSPITAL_COMMUNITY): Payer: Medicare Other | Admitting: Physical Therapy

## 2017-03-02 DIAGNOSIS — E119 Type 2 diabetes mellitus without complications: Secondary | ICD-10-CM

## 2017-03-02 LAB — EXPECTORATED SPUTUM ASSESSMENT W GRAM STAIN, RFLX TO RESP C

## 2017-03-02 LAB — CBC
HEMATOCRIT: 31.5 % — AB (ref 36.0–46.0)
Hemoglobin: 10.2 g/dL — ABNORMAL LOW (ref 12.0–15.0)
MCH: 28.1 pg (ref 26.0–34.0)
MCHC: 32.4 g/dL (ref 30.0–36.0)
MCV: 86.8 fL (ref 78.0–100.0)
PLATELETS: 354 10*3/uL (ref 150–400)
RBC: 3.63 MIL/uL — ABNORMAL LOW (ref 3.87–5.11)
RDW: 14 % (ref 11.5–15.5)
WBC: 19.1 10*3/uL — AB (ref 4.0–10.5)

## 2017-03-02 LAB — BASIC METABOLIC PANEL
ANION GAP: 9 (ref 5–15)
BUN: 22 mg/dL — ABNORMAL HIGH (ref 6–20)
CALCIUM: 8.5 mg/dL — AB (ref 8.9–10.3)
CO2: 22 mmol/L (ref 22–32)
Chloride: 105 mmol/L (ref 101–111)
Creatinine, Ser: 1.09 mg/dL — ABNORMAL HIGH (ref 0.44–1.00)
GFR calc Af Amer: >60 mL/min (ref >60)
GFR, EST NON AFRICAN AMERICAN: 59 mL/min — AB (ref >60)
GLUCOSE: 117 mg/dL — AB (ref 65–99)
Potassium: 3.4 mmol/L — ABNORMAL LOW (ref 3.5–5.1)
Sodium: 136 mmol/L (ref 135–145)

## 2017-03-02 LAB — GLUCOSE, CAPILLARY
GLUCOSE-CAPILLARY: 116 mg/dL — AB (ref 65–99)
GLUCOSE-CAPILLARY: 272 mg/dL — AB (ref 65–99)
Glucose-Capillary: 121 mg/dL — ABNORMAL HIGH (ref 65–99)
Glucose-Capillary: 165 mg/dL — ABNORMAL HIGH (ref 65–99)

## 2017-03-02 MED ORDER — BENZONATATE 100 MG PO CAPS
200.0000 mg | ORAL_CAPSULE | Freq: Three times a day (TID) | ORAL | Status: DC
  Administered 2017-03-02 – 2017-03-03 (×6): 200 mg via ORAL
  Filled 2017-03-02 (×6): qty 2

## 2017-03-02 MED ORDER — FLUCONAZOLE 200 MG PO TABS
200.0000 mg | ORAL_TABLET | Freq: Once | ORAL | Status: AC
  Administered 2017-03-02: 200 mg via ORAL
  Filled 2017-03-02: qty 1

## 2017-03-02 MED ORDER — ALBUTEROL SULFATE (2.5 MG/3ML) 0.083% IN NEBU
2.5000 mg | INHALATION_SOLUTION | Freq: Two times a day (BID) | RESPIRATORY_TRACT | Status: DC
  Administered 2017-03-02 – 2017-03-03 (×3): 2.5 mg via RESPIRATORY_TRACT
  Filled 2017-03-02 (×3): qty 3

## 2017-03-02 MED ORDER — LOSARTAN POTASSIUM 25 MG PO TABS
25.0000 mg | ORAL_TABLET | Freq: Every day | ORAL | Status: DC
  Administered 2017-03-02 – 2017-03-04 (×3): 25 mg via ORAL
  Filled 2017-03-02 (×3): qty 1

## 2017-03-02 MED ORDER — INSULIN ASPART 100 UNIT/ML ~~LOC~~ SOLN
10.0000 [IU] | Freq: Three times a day (TID) | SUBCUTANEOUS | Status: DC
  Administered 2017-03-02 – 2017-03-03 (×2): 10 [IU] via SUBCUTANEOUS

## 2017-03-02 NOTE — Progress Notes (Signed)
[facility] PHYSICAL MEDICINE & REHABILITATION     PROGRESS NOTE  Subjective/Complaints:  Pt seen sitting up in bed this AM, eating breakfast.  She states she slept better overnight.  She states "she doesn't feel good".  Per nursing, pt symptoms appear to be exaggerated when healthcare provider present. Pt also stating she has a good attorney.   ROS:  +Hemoptysis. Denies CP, N/V/D.  Objective: Vital Signs: Blood pressure (!) 163/88, pulse 96, temperature 98.4 F (36.9 C), temperature source Oral, resp. rate 18, height 5\' 1"  (1.549 m), weight 101.7 kg (224 lb 3 oz), SpO2 100 %. Dg Neck Soft Tissue  Result Date: 03/01/2017 CLINICAL DATA:  46 year old female with neck pain following recent intubation. EXAM: NECK SOFT TISSUES - 1+ VIEW COMPARISON:  None. FINDINGS: There is no evidence of retropharyngeal soft tissue swelling or epiglottic enlargement. The cervical airway is unremarkable and no radio-opaque foreign body identified. IMPRESSION: Negative. Electronically Signed   By: Margarette Canada M.D.   On: 03/01/2017 08:04   Dg Chest Port 1 View  Result Date: 03/01/2017 CLINICAL DATA:  Hemoptysis. EXAM: PORTABLE CHEST 1 VIEW COMPARISON:  Multiple recent exams most recently 2 days prior 02/27/2017 FINDINGS: The cardiomediastinal contours are unchanged, decreased cardiomegaly. Improved lung aeration from prior. Pulmonary vasculature is normal. No consolidation, pleural effusion, or pneumothorax. No acute osseous abnormalities are seen. IMPRESSION: Improving aeration from prior exams.  No acute abnormality. Electronically Signed   By: Jeb Levering M.D.   On: 03/01/2017 02:06    Recent Labs  02/28/17 0427 03/02/17 0515  WBC 17.5* 19.1*  HGB 10.5* 10.2*  HCT 32.6* 31.5*  PLT 345 354    Recent Labs  03/01/17 0456 03/02/17 0515  NA 136 136  K 3.4* 3.4*  CL 104 105  GLUCOSE 88 117*  BUN 20 22*  CREATININE 1.05* 1.09*  CALCIUM 8.7* 8.5*   CBG (last 3)   Recent Labs  03/01/17 1139  03/01/17 2125 03/02/17 0641  GLUCAP 176* 212* 121*    Wt Readings from Last 3 Encounters:  02/28/17 101.7 kg (224 lb 3 oz)  02/27/17 105.7 kg (233 lb 0.4 oz)  01/05/17 108.7 kg (239 lb 9.6 oz)    Physical Exam:  BP (!) 163/88 (BP Location: Right Wrist)   Pulse 96   Temp 98.4 F (36.9 C) (Oral)   Resp 18   Ht 5\' 1"  (1.549 m)   Wt 101.7 kg (224 lb 3 oz)   SpO2 100%   BMI 42.36 kg/m  Constitutional: She appears well-developed. Obese. NAD. HENT: Normocephalic and atraumatic.  Eyes: EOMI. No discharge.  Cardiovascular: RRR. No JVD. Respiratory: Increased WOB. No stridor.  GI: Soft. Bowel sounds are normal.  Musculoskeletal: She exhibits edema and tenderness.  Neurological: She is alert and oriented.  Motor: 4-/5 throughout (improving). Skin: Skin is warm and dry.  Psychiatric: Her speech is normal. Her mood appears anxious. She is agitated. Cognition and memory are not impaired.   Assessment/Plan: 1. Functional deficits secondary to critical illness polyneuropathy which require 3+ hours per day of interdisciplinary therapy in a comprehensive inpatient rehab setting. Physiatrist is providing close team supervision and 24 hour management of active medical problems listed below. Physiatrist and rehab team continue to assess barriers to discharge/monitor patient progress toward functional and medical goals.  Function:  Bathing Bathing position   Position: Shower  Bathing parts Body parts bathed by patient: Right arm, Left arm, Chest, Abdomen, Front perineal area, Buttocks, Right upper leg, Left upper leg  Body parts bathed by helper: Left lower leg, Back, Right lower leg  Bathing assist Assist Level: Touching or steadying assistance(Pt > 75%)      Upper Body Dressing/Undressing Upper body dressing   What is the patient wearing?: Bra, Pull over shirt/dress Bra - Perfomed by patient: Thread/unthread right bra strap, Thread/unthread left bra strap Bra - Perfomed by helper:  Hook/unhook bra (pull down sports bra) Pull over shirt/dress - Perfomed by patient: Thread/unthread right sleeve, Thread/unthread left sleeve Pull over shirt/dress - Perfomed by helper: Put head through opening        Upper body assist Assist Level: Touching or steadying assistance(Pt > 75%)      Lower Body Dressing/Undressing Lower body dressing   What is the patient wearing?: Pants, Non-skid slipper socks     Pants- Performed by patient: Pull pants up/down, Thread/unthread right pants leg, Thread/unthread left pants leg (A to bring leg to seated figure 4)   Non-skid slipper socks- Performed by patient: Don/doff right sock, Don/doff left sock                    Lower body assist Assist for lower body dressing: Touching or steadying assistance (Pt > 75%)      Toileting Toileting   Toileting steps completed by patient: Adjust clothing prior to toileting, Performs perineal hygiene, Adjust clothing after toileting Toileting steps completed by helper: Adjust clothing prior to toileting, Adjust clothing after toileting Toileting Assistive Devices: Toilet aid  Toileting assist Assist level: Touching or steadying assistance (Pt.75%)   Transfers Chair/bed transfer   Chair/bed transfer method: Stand pivot Chair/bed transfer assist level: Touching or steadying assistance (Pt > 75%) Chair/bed transfer assistive device: Armrests     Locomotion Ambulation     Max distance: 5 ft  Assist level: 2 helpers (mod assist + w/c follow for safety)   Wheelchair Wheelchair activity did not occur: Safety/medical concerns        Cognition Comprehension Comprehension assist level: Follows complex conversation/direction with no assist  Expression Expression assist level: Expresses complex ideas: With no assist  Social Interaction Social Interaction assist level: Interacts appropriately with others - No medications needed.  Problem Solving Problem solving assist level: Solves complex  problems: Recognizes & self-corrects  Memory Memory assist level: Complete Independence: No helper      Medical Problem List and Plan:  1. Functional and mobility deficits secondary to critical illness polyneuropathy   Cont CIR 2. DVT Prophylaxis/Anticoagulation: Pharmaceutical: Lovenox  3. Chronic back pain with radiculopathy/Pain Management: IV fentanyl discontinued.   Hydrocodone---pt took this at home q6 prn  4. Mood: team to provide ego support. Continue zoloft. LCSW to follow for evaluation and support.  5. Neuropsych: This patient is capable of making decisions on her own behalf.  6. Skin/Wound Care: Routine pressure relief measures.  7. Fluids/Electrolytes/Nutrition: Monitor I/Os.  8. Asthma exacerbation: Completed steroids. Continue brovana and duonebs. Encourage aggressive pulmonary hygiene.  9. Moraxella HCAP: completed Zithromax 6/9.  10. AKI: Avoid nephrotoxic drugs  Cr 1.09 on 6/11  Cont to monitor 11.HTN: Monitor BP bid.  On Norvasc  Losartan 25 started on 6/11 (PTA 100)  12. T2DM: Monitor BS ac/hs. Resumed prandin and dulaglutide.   Continues to be labile, will consider meal coverage 13. Morbid obesity: diet education for weight loss and to promoted over all health.  14. Leucocytosis: Likely reactive due to infection and steroids. Continue to monitor.   WBCs 19.1 on 6/11  Afebrile  Cont to monitor  Will cont to monitor now that steroids completed 15. Severe Throat pain: Added Carafate ac/hs with MMW prn.  16. Migraines: has been refusing Topamax. Continue Imitrex prn.  17. Neuropathy: Increased gabapentin to tid to help manage dysesthesias RUE  18. Hypoalbuminemia  Supplement initiated 6/9 19. Hemoptysis  CXR reviewed, improved  Neck xray reviewed, unremarkable  PPI increased to BID  Throat spray/lozanges ordered  Spoke with GI, will consider Pulm eval if warranted  LOS (Days) 3 A FACE TO FACE EVALUATION WAS PERFORMED  Ankit Lorie Phenix 03/02/2017 8:25 AM

## 2017-03-02 NOTE — Consult Note (Signed)
Name: [name] MRN: [id number] DOB: 15-Jun-1971    ADMISSION DATE:  02/27/2017 CONSULTATION DATE:  6/11  REFERRING MD :  Dr. Posey Pronto [facility]  CHIEF COMPLAINT:  Asthma  HISTORY OF PRESENT ILLNESS:  46 year old female with PMH as below, which is significant for Asthma, DM, Depression, and HTN. She was admitted initially on 5/30 to [facility] for asthma exacerbation requiring intubation. She is smoker who has been hospitalized in the past for this, but had never been intubated before. While intubated she required neuromuscular blockade in order to safely ventilate. She was able to be extubated 6/5. She was treated with azithromycin for sputum culture which grew moraxella. She also required CRRT for a short period while in ICU. Once air trapping and vent mechanic improved, her renal function did as well. She was able to transfer to medical floor, but remained weak, which was thought to be secondary to ICU aqcuired weakness. Since her arrival she is felt to be slow to improve from asthma and PCCM has been asked to see.   SIGNIFICANT EVENTS  5/30 admit asthma > intubate 5/31-6/3 high FIO2/PEEP, Neuromuscular blocked 6/5 extubate 6/8 discharge to CIR  STUDIES:   PAST MEDICAL HISTORY :   has a past medical history of Arthritis; Asthma; Chronic back pain; Chronic chest pain; Depression; DM (diabetes mellitus) ([place]); GERD (gastroesophageal reflux disease); Headache(784.0); Hypertension; and Hypokalemia.  has a past surgical history that includes Cesarean section; Appendectomy; Vesicovaginal fistula closure w/ TAH (2009); Hernia repair; Cholecystectomy (N/A, 03/02/2014); and Rotator cuff repair. Prior to Admission medications   Medication Sig Start Date End Date Taking? Authorizing Provider  acetaminophen-codeine (TYLENOL #3) 300-30 MG tablet Take 1 tablet by mouth every 12 (twelve) hours as needed for moderate pain. 09/08/16   Arnoldo Morale, MD  albuterol (PROVENTIL HFA;VENTOLIN HFA)  108 (90 Base) MCG/ACT inhaler Inhale 2 puffs into the lungs every 6 (six) hours as needed for wheezing or shortness of breath. Reported on 02/22/2016 09/08/16   Arnoldo Morale, MD  albuterol (PROVENTIL) (2.5 MG/3ML) 0.083% nebulizer solution Take 3 mLs (2.5 mg total) by nebulization every 6 (six) hours as needed for wheezing or shortness of breath. Reported on 02/22/2016 09/08/16   Arnoldo Morale, MD  amLODipine (NORVASC) 5 MG tablet Take 1 tablet (5 mg total) by mouth daily. 01/05/17   Clent Demark, PA-C  arformoterol [facility]) 15 MCG/2ML NEBU Take 2 mLs (15 mcg total) by nebulization 2 (two) times daily. 02/27/17   Diallo, Earna Coder, MD  aspirin EC 325 MG tablet Take 325 mg by mouth daily.    [provider]  Blood Glucose Monitoring Suppl (ACCU-CHEK AVIVA PLUS) w/Device KIT Use to check blood sugar 3 times per day. DX CODE: E11.9 Patient not taking: Reported on 02/19/2017 05/21/16   Renato Shin, MD  bromocriptine (PARLODEL) 2.5 MG tablet 1/4 tab daily Patient taking differently: Take 0.625 mg by mouth daily.  08/06/16   Renato Shin, MD  budesonide (PULMICORT) 0.5 MG/2ML nebulizer solution Take 2 mLs (0.5 mg total) by nebulization 2 (two) times daily. 02/27/17   Diallo, Earna Coder, MD  dapagliflozin propanediol (FARXIGA) 10 MG TABS tablet Take 10 mg by mouth daily. 01/25/16   Renato Shin, MD  Dulaglutide (TRULICITY) 1.5 ZJ/6.9CV SOPN Inject 1.5 mg into the skin once a week. 01/26/17   Renato Shin, MD  gabapentin (NEURONTIN) 300 MG capsule Take 1 capsule (300 mg total) by mouth 3 (three) times daily. 09/08/16   Arnoldo Morale, MD  glucose blood (ACCU-CHEK  AVIVA PLUS) test strip Use to check blood sugar 3 times per day.DX Code E11.9 Patient not taking: Reported on 02/19/2017 01/26/17   Renato Shin, MD  Insulin Pen Needle 32G X 4 MM MISC Use to inject insulin 3 times per day. Patient not taking: Reported on 02/19/2017 10/29/15   Renato Shin, MD  Lancet Devices [facility]) lancets Use to  check blood sugar 3 times per day dx code E11.9 Patient not taking: Reported on 02/19/2017 09/18/15   Elayne Snare, MD  Lancets (ACCU-CHEK MULTICLIX) lancets Use to check blood sugar 3 times per day. DX code E11.9 Patient not taking: Reported on 02/19/2017 08/24/15   Renato Shin, MD  methocarbamol (ROBAXIN) 500 MG tablet Take 1 tablet (500 mg total) by mouth every 8 (eight) hours as needed for muscle spasms. 09/08/16   Arnoldo Morale, MD  olopatadine (PATANOL) 0.1 % ophthalmic solution Place 1 drop into the right eye 2 (two) times daily. 09/29/16   Arnoldo Morale, MD  oxyCODONE-acetaminophen (PERCOCET) 5-325 MG tablet Take 1-2 tablets by mouth every 4 (four) hours as needed. Patient taking differently: Take 1-2 tablets by mouth every 4 (four) hours as needed for moderate pain.  06/23/16   Harris, Abigail, PA-C  pantoprazole (PROTONIX) 40 MG tablet Take 1 tablet (40 mg total) by mouth daily. 09/08/16   Arnoldo Morale, MD  predniSONE (DELTASONE) 50 MG tablet Patient will take for 3 more days 50 mg a day 02/28/17   Diallo, Earna Coder, MD  prochlorperazine (COMPAZINE) 10 MG tablet Take 1 tablet (10 mg total) by mouth 2 (two) times daily as needed for nausea or vomiting. 08/21/16   Carlisle Cater, PA-C  repaglinide (PRANDIN) 2 MG tablet 1 tab with breakfast and lunch, and 2 tabs with dinner. Patient taking differently: Take 2-4 mg by mouth See admin instructions. Take 2 mg by mouth with breakfast and lunch, and 4 mg by mouth with dinner. 05/27/16   Renato Shin, MD  sertraline (ZOLOFT) 50 MG tablet Take 1 tablet (50 mg total) by mouth daily. 09/08/16   Arnoldo Morale, MD  simvastatin (ZOCOR) 10 MG tablet Take 1 tablet (10 mg total) by mouth daily at 6 PM. 09/08/16   Arnoldo Morale, MD  SUMAtriptan (IMITREX) 100 MG tablet TAKE 1TAB AT EARLIEST ONSET OF HEADACHE. MAY REPEAT X1 IN 2 HOURS IF HEADACHE PERSISTS OR RECURS. DO NOT EXCEED 2 TABS IN 24HRS 09/08/16   Arnoldo Morale, MD   Allergies  Allergen Reactions  . Reglan  [Metoclopramide] Other (See Comments)    Panic attack  . Topamax [Topiramate]     Per patient, face swells and fingers tingles    FAMILY HISTORY:  family history includes Arthritis in her mother; Diabetes in her brother, mother, and sister; Heart attack in her mother; Hypertension in her mother and sister; Seizures in her brother; Stroke in her father and mother. SOCIAL HISTORY:  reports that she has been smoking Cigarettes.  She has a 5.50 pack-year smoking history. She has never used smokeless tobacco. She reports that she drinks about 0.6 - 1.2 oz of alcohol per week . She reports that she does not use drugs.  REVIEW OF SYSTEMS:   Bolds are positive  Constitutional: weight loss, gain, night sweats, Fevers, chills, fatigue .  HEENT: headaches, Sore throat, sneezing, nasal congestion, post nasal drip, Difficulty swallowing, Tooth/dental problems, visual complaints visual changes, ear ache CV:  chest pain, radiates:,Orthopnea, PND, swelling in lower extremities, dizziness, palpitations, syncope.  GI  heartburn, indigestion, abdominal pain, nausea,  vomiting, diarrhea, change in bowel habits, loss of appetite, bloody stools.  Resp: cough, productive pink tinged:, hemoptysis, dyspnea improving, chest pain, pleuritic.  Skin: rash or itching or icterus GU: dysuria, change in color of urine, urgency or frequency. flank pain, hematuria  MS: joint pain or swelling. decreased range of motion  Psych: change in mood or affect. depression or anxiety.  Neuro: difficulty with speech, weakness, numbness, ataxia    SUBJECTIVE:   VITAL SIGNS: Temp:  [98.4 F (36.9 C)-98.9 F (37.2 C)] 98.4 F (36.9 C) (06/11 0507) Pulse Rate:  [87-96] 96 (06/11 0507) Resp:  [18] 18 (06/11 0507) BP: (151-163)/(75-88) 163/88 (06/11 0507) SpO2:  [98 %-100 %] 100 % (06/11 1123) FiO2 (%):  [28 %] 28 % (06/10 2106)  PHYSICAL EXAMINATION: General:  Obese female in NAD Neuro:  Alert, oriented, non-focal HEENT:   Bushyhead/AT, PERRL, no JVD. Some upper airway sonorous respiratory sound likely due to occlusion when head forward.  Cardiovascular:  RRR, no MRG Lungs:  Clear bilateral breath sounds Abdomen:  Soft, non-tender, non-distended Musculoskeletal:  No acute deformity or ROM limitation Skin: Grossly intact   Recent Labs Lab 02/28/17 0427 03/01/17 0456 03/02/17 0515  NA 135 136 136  K 3.8 3.4* 3.4*  CL 105 104 105  CO2 21* 22 22  BUN 25* 20 22*  CREATININE 1.17* 1.05* 1.09*  GLUCOSE 239* 88 117*    Recent Labs Lab 02/27/17 0452 02/28/17 0427 03/02/17 0515  HGB 11.1* 10.5* 10.2*  HCT 33.5* 32.6* 31.5*  WBC 17.8* 17.5* 19.1*  PLT 343 345 354   Dg Neck Soft Tissue  Result Date: 03/01/2017 CLINICAL DATA:  46 year old female with neck pain following recent intubation. EXAM: NECK SOFT TISSUES - 1+ VIEW COMPARISON:  None. FINDINGS: There is no evidence of retropharyngeal soft tissue swelling or epiglottic enlargement. The cervical airway is unremarkable and no radio-opaque foreign body identified. IMPRESSION: Negative. Electronically Signed   By: Margarette Canada M.D.   On: 03/01/2017 08:04   Dg Chest Port 1 View  Result Date: 03/01/2017 CLINICAL DATA:  Hemoptysis. EXAM: PORTABLE CHEST 1 VIEW COMPARISON:  Multiple recent exams most recently 2 days prior 02/27/2017 FINDINGS: The cardiomediastinal contours are unchanged, decreased cardiomegaly. Improved lung aeration from prior. Pulmonary vasculature is normal. No consolidation, pleural effusion, or pneumothorax. No acute osseous abnormalities are seen. IMPRESSION: Improving aeration from prior exams.  No acute abnormality. Electronically Signed   By: Jeb Levering M.D.   On: 03/01/2017 02:06    ASSESSMENT / PLAN:  Asthma with acute exacerbation: improving. She feels as though she is recovery and believes her current regimen is the best regimen she has been on to date. Breathing comfortably on room air. Not back to baseline as of yet, but feels  she is getting there. No wheeze on exam. Remains hoarse from intubation.  - Supplemental O2 as needed to keep O2 sats > 92% - Continue budesonide, brovana. She would prefer to stay on these at discharge as able because they are working better for her - Continue prednisone taper - Cough suppression - ABX completed  Will require pulmonary follow up 1-2 weeks after discharge.  Georgann Housekeeper, AGACNP-BC [place] [facility] Pager [phone] or [phone]  03/02/2017 12:27 PM  Attending Note:  46 year old female with asthma, smoker and chronic pain issues that was recently hospitalized in the ICU due to respiratory and renal failure related to asthma and air trapping.  Was paralyzed for some time.  On exam today,  lungs are clear with no transmitted upper airway sounds unless chin is tucked.  I reviewed CXR myself, not acute disease noted.  Discussed with PCCM-NP.  Asthma exacerbation: I see no evidence of this here.  I believe a large component of this is psychiatric.  Patient reports she feels at baseline now but wants all the inhalers and pain medicine she gets here to take with her at home.  - Bronchdilators as ordered above  - Pred taper  - No further need for abx  Anxiety:  - May benefit from psych to eval to control anxiety  VCD:  - Advised to avoid chin tuck position as that compromises her airway  Hypoxemia:  - Titrate O2 for sat of 88-92%, should come off prior to discharge.  Just insure she get her inhalers at current regiment upon discharge.  PCCM will sign off, please call back if needed.  Patient seen and examined, agree with above note.  I dictated the care and orders written for this patient under my direction.  Rush Farmer, [place]

## 2017-03-02 NOTE — Care Management Note (Signed)
[facility] Individual Statement of Services  Patient Name:  [name]  Date:  03/02/2017  Welcome to the [facility].  Our goal is to provide you with an individualized program based on your diagnosis and situation, designed to meet your specific needs.  With this comprehensive rehabilitation program, you will be expected to participate in at least 3 hours of rehabilitation therapies Monday-Friday, with modified therapy programming on the weekends.  Your rehabilitation program will include the following services:  Physical Therapy (PT), Occupational Therapy (OT), Speech Therapy (ST), 24 hour per day rehabilitation nursing, Neuropsychology, Case Management (Social Worker), Rehabilitation Medicine, Nutrition Services and Pharmacy Services  Weekly team conferences will be held on Wednesdays to discuss your progress.  Your Social Worker will talk with you frequently to get your input and to update you on team discussions.  Team conferences with you and your family in attendance may also be held.  Expected length of stay: 7 days  Overall anticipated outcome: supervision  Depending on your progress and recovery, your program may change. Your Social Worker will coordinate services and will keep you informed of any changes. Your Social Worker's name and contact numbers are listed  below.  The following services may also be recommended but are not provided by the [facility] will be made to provide these services after discharge if needed.  Arrangements include referral to agencies that provide these services.  Your insurance has been verified to be:  Medicare and Medicaid Your primary doctor is:  Dr. Jarold Song  Pertinent information will be shared with your doctor and your insurance company.  Social Worker:   [place], [place] or [phone]   Information discussed with and copy given to patient by: Lennart Pall, 03/02/2017, 4:14 PM

## 2017-03-02 NOTE — Progress Notes (Signed)
Received patient sitting in bed talking with family members, no S/S of distress noted. Complained pain and still coughing up blood. Pain med PRN and cough med given and was effective. Patient slept good. We continue to monitor. Patient still concerns about her throat.

## 2017-03-02 NOTE — Progress Notes (Signed)
Social Work  Social Work Assessment and Plan  Patient Details  Name: [name] MRN: [id number] Date of Birth: 1971-04-01  Today's Date: 03/02/2017  Problem List:  Patient Active Problem List   Diagnosis Date Noted  . Acute blood loss anemia   . Type 2 diabetes mellitus with peripheral neuropathy (HCC)   . Generalized anxiety disorder   . SOB (shortness of breath)   . Hemoptysis   . Hypoalbuminemia due to protein-calorie malnutrition ([place])   . Benign essential HTN   . HCAP (healthcare-associated pneumonia)   . Severe asthma with acute exacerbation   . Critical illness myopathy 02/27/2017  . Acute on chronic respiratory failure with hypoxemia ([place])   . AKI (acute kidney injury) ([place])   . Diabetes mellitus type 2 in obese ([place])   . Morbid obesity ([place])   . Chronic pain syndrome   . Leukocytosis   . Critical illness polyneuropathy ([place])   . Acute renal failure ([place])   . Gastritis and gastroduodenitis 02/22/2016  . Hyperlipidemia 02/22/2016  . Depression 02/22/2016  . Migraine 01/30/2015  . Headache 01/30/2015  . URI (upper respiratory infection) 06/04/2014  . Atypical chest pain 02/26/2014  . DM type 2 (diabetes mellitus, type 2) ([place]) 02/26/2014  . Stable asthma 02/26/2014  . Chest pain 11/10/2013  . Hypertensive urgency 11/10/2013  . Back pain 09/05/2013  . Essential hypertension 09/05/2013  . Acute bronchitis 05/01/2013  . Asthma exacerbation 04/27/2013  . Acute respiratory failure ([place]) 04/27/2013  . Uncontrolled diabetes mellitus ([place]) 04/27/2013  . Obesity 04/27/2013  . Allergic rhinitis, seasonal 08/11/2012  . Chronic cough 08/11/2012  . Sleep apnea, obstructive 12/04/2011   Past Medical History:  Past Medical History:  Diagnosis Date  . Arthritis   . Asthma   . Chronic back pain   . Chronic chest pain   . Depression   . DM (diabetes mellitus) ([place])    INSULIN DEPENDENT  . GERD (gastroesophageal reflux disease)   . Headache(784.0)    . Hypertension   . Hypokalemia    Past Surgical History:  Past Surgical History:  Procedure Laterality Date  . APPENDECTOMY    . CESAREAN SECTION     x 3  . CHOLECYSTECTOMY N/A 03/02/2014   Procedure: LAPAROSCOPIC CHOLECYSTECTOMY;  Surgeon: Joyice Faster. Cornett, MD;  Location: [facility];  Service: General;  Laterality: N/A;  . HERNIA REPAIR    . ROTATOR CUFF REPAIR    . VESICOVAGINAL FISTULA CLOSURE W/ TAH  2009   Social History:  reports that she has been smoking Cigarettes.  She has a 5.50 pack-year smoking history. She has never used smokeless tobacco. She reports that she drinks about 0.6 - 1.2 oz of alcohol per week . She reports that she does not use drugs.  Family / Support Systems Marital Status: Married Patient Roles: Spouse, Parent Spouse/Significant Other: [name] @ ([phone] Children: 3 adult children Other Supports: brother, York Ram @ ([phone] Anticipated Caregiver: Spouse, children, nephew and extended family members Ability/Limitations of Caregiver: Husband works.  Nephew and his girlfriend currently not working and can assist.  Children work varying jobs. Caregiver Availability: 24/7 Family Dynamics: Family very supportive and able to provide any assist needed.  Pt denies any concerns about family involvement.  Social History Preferred language: English Religion: Baptist Cultural Background: NA Read: Yes Write: Yes Employment Status: Disabled Date Retired/Disabled/Unemployed: 2000 Freight forwarder Issues: None Guardian/Conservator: None - per MD, pt is capable of making decisions on her own  behalf.   Abuse/Neglect Physical Abuse: Denies Verbal Abuse: Denies Sexual Abuse: Denies Exploitation of patient/patient's resources: Denies Self-Neglect: Denies  Emotional Status Pt's affect, behavior adn adjustment status: Pt very pleasant and able to complete assessment interview without difficulty.  She is eager to d/c home and  denies any s/s of any emotional distress.  Likely to d/c soon, however, will monitor and refer for neuropsych as indicated. Recent Psychosocial Issues: None Pyschiatric History: None Substance Abuse History: None  Patient / Family Perceptions, Expectations & Goals Pt/Family understanding of illness & functional limitations: Pt and spouse with general understanding of her medical issues and current functional limitations/ need for CIR. Premorbid pt/family roles/activities: Pt was completely independent PTA Anticipated changes in roles/activities/participation: No significant changes anticipated if reaching mod ind/ supervision goals. Pt/family expectations/goals: "I just want to go home."  [employer]: None Premorbid Home Care/DME Agencies: None Transportation available at discharge: yes  Discharge Planning Living Arrangements: Spouse/significant other, Other relatives Support Systems: Spouse/significant other Type of Residence: Private residence Insurance Resources: Information systems manager, [employer] (specify county) Pensions consultant: SSD, [employer] Financial Screen Referred: No Living Expenses: Education officer, community Management: Spouse Does the patient have any problems obtaining your medications?: No Home Management: Pt and family Patient/Family Preliminary Plans: Pt to d/c home with family able to provide 24/7 assistance. Social Work Anticipated Follow Up Needs: HH/OP Expected length of stay: 7 days  Clinical Impression Very pleasant woman here for critical illness polyneuropathy and making very quick gains, anticipating very short LOS.  Denies any significant emotional distress.  Eager to d/c home.  Family very involved and able to provide 24/7 support.  Will follow for d/c planning needs.  Raylea Adcox 03/02/2017, 4:13 PM

## 2017-03-02 NOTE — Progress Notes (Signed)
Occupational Therapy Session Note  Patient Details  Name: [name] MRN: [id number] Date of Birth: 09-24-1970  Today's Date: 03/02/2017 OT Individual Time: 7591-6384 OT Individual Time Calculation (min): 55 min    Short Term Goals: Week 1:  OT Short Term Goal 1 (Week 1): Pt will stand to complete 2/2 grooming items at sink wiht supervision OT Short Term Goal 2 (Week 1): Pt will stand for 5 min to complete functional activity with supervision to imrpove endurance required for BADLs OT Short Term Goal 3 (Week 1): Pt will complete walk in shower transfer with supervision OT Short Term Goal 4 (Week 1): Pt will complete BSC/toilet transfer wiht supervision  Skilled Therapeutic Interventions/Progress Updates:    Pt ambulated to the therapy gym with RW and supervision.  Once in the gym had pt complete 2 sets of 5 mins each using the Nustep.  First set completed with average number of steps 43 per minute with resistance set on level 4.  HR 111 BPM with Oxygen sats 96% on room air.  Pt completed second set after 5 min rest break using just the LEs only with average number of steps at 33.  Pt reporting level 13 on the Borg Perceived exertion scale.  Next had pt complete Berg Balance Test.  She was able to score 53/56 demonstrating only LOB with 360 degree turn and increased time when reaching down to the floor.  Next had pt complete functional mobility without use of an assistive device to the family room and back to the gym.  Pt overall supervision with slower rate of mobility.  Completed walking heel to toe both forward and backwards in the hall without LOB.  Pt returned to room at end of session with call button and phone in reach.  Pt with small amount of vomiting noted, nursing made aware.    Therapy Documentation Precautions:  Precautions Precautions: Fall Precaution Comments: monitor HR Restrictions Weight Bearing Restrictions: No  Pain: Pain Assessment Pain Assessment:  Faces Faces Pain Scale: Hurts a little bit Pain Type: Acute pain Pain Location: Throat Pain Onset: On-going Pain Intervention(s): Emotional support ADL: See Function Navigator for Current Functional Status.   Therapy/Group: Individual Therapy  Alejah Aristizabal OTR/L 03/02/2017, 9:28 AM

## 2017-03-02 NOTE — Progress Notes (Signed)
Occupational Therapy Session Note  Patient Details  Name: [name] MRN: [id number] Date of Birth: 06-07-1971  Today's Date: 03/02/2017 OT Individual Time: 0301-3143 OT Individual Time Calculation (min): 70 min    Short Term Goals: Week 1:  OT Short Term Goal 1 (Week 1): Pt will stand to complete 2/2 grooming items at sink wiht supervision OT Short Term Goal 2 (Week 1): Pt will stand for 5 min to complete functional activity with supervision to imrpove endurance required for BADLs OT Short Term Goal 3 (Week 1): Pt will complete walk in shower transfer with supervision OT Short Term Goal 4 (Week 1): Pt will complete BSC/toilet transfer wiht supervision  Skilled Therapeutic Interventions/Progress Updates:    Pt ambulated from her room to the ADL apartment for session.  Had her work on making up the bed to simulate home management task.  She was able to complete with overall modified independence.  Once finished had pt ambulate to the ortho gym for further work on balance using the Wii.  She was able to maintain standing balance statically with incorporation of weightshifts in all direction, without LOB.  Several intervals completed with rest breaks needed in-between sets secondary to increased back pain.  Pt's significant other also present for session as well.  Finished session with return to room.  Call button and phone in reach.    Therapy Documentation Precautions:  Precautions Precautions: Fall Precaution Comments: monitor HR Restrictions Weight Bearing Restrictions: No  Pain: Pain Assessment Pain Assessment:  Back pain rated at  3/10 on the faces scale Emotional support and rest given PRN ADL:  See Function Navigator for Current Functional Status.   Therapy/Group: Individual Therapy  Hadiya Spoerl OTR/L 03/02/2017, 3:32 PM

## 2017-03-02 NOTE — Progress Notes (Signed)
Physical Therapy Session Note  Patient Details  Name: [name] MRN: [id number] Date of Birth: 19-Nov-1970  Today's Date: 03/02/2017 PT Individual Time: 1000-1100 PT Individual Time Calculation (min): 60 min   Short Term Goals: Week 1:  PT Short Term Goal 1 (Week 1): Pt will negotiate 4 steps with B rails and mod assist. PT Short Term Goal 2 (Week 1): Pt will ambulate 50 ft with LRAD & mod assist. PT Short Term Goal 3 (Week 1): Pt will stand for 5 minutes to increase activity tolerance.  Skilled Therapeutic Interventions/Progress Updates:    PT sitting in chair upon arrival, agreeable to PT session. Motivated to get home ASAP but concerned about throat and having blood in sputum. Confirms that she will have help 24/7. Bed mobility: supine<>sitting mod I, rolling mod I. Transfers: sit<>stand supervision - mod I performed from bed, mat table, couch, recliner, w/c. Car transfer with supervision.Ambulation: 220 ft X2, 150 ft X2, 100 ft X1 and multiple shorter distances throughout session. Ambulation also performed in apartment/kitchen. Stairs up/down 12 steps with rail (3inch/6 inch steps). Balance: sitting static>dynamic reaching, standing static>dynamic. Pt able to ambulate 10 feet heel-toe pattern. Able to hold staggered stance. Following session, pt sitting in w/c in room with family present and all needs in reach. During session, pt coughed and gagged. Pt reports spitting up something hard. Object noted in sputum, nursing notified and object placed in cup to show PA-C. Pt reports feeling better after coughing up object.   Therapy Documentation Precautions:  Precautions Precautions: Fall Precaution Comments: monitor HR Restrictions Weight Bearing Restrictions: No  Pain: 6-7/10 in throat. Monitored during session.   See Function Navigator for Current Functional Status.   Therapy/Group: Individual Therapy  Linard Millers, PT 03/02/2017, 10:40 AM

## 2017-03-03 ENCOUNTER — Inpatient Hospital Stay (HOSPITAL_COMMUNITY): Payer: Medicare Other | Admitting: Occupational Therapy

## 2017-03-03 ENCOUNTER — Inpatient Hospital Stay (HOSPITAL_COMMUNITY): Payer: Medicare Other

## 2017-03-03 ENCOUNTER — Inpatient Hospital Stay (HOSPITAL_COMMUNITY): Payer: Medicare Other | Admitting: Speech Pathology

## 2017-03-03 ENCOUNTER — Inpatient Hospital Stay (HOSPITAL_COMMUNITY): Payer: Medicare Other | Admitting: Physical Therapy

## 2017-03-03 DIAGNOSIS — E1142 Type 2 diabetes mellitus with diabetic polyneuropathy: Secondary | ICD-10-CM

## 2017-03-03 DIAGNOSIS — F411 Generalized anxiety disorder: Secondary | ICD-10-CM

## 2017-03-03 DIAGNOSIS — D62 Acute posthemorrhagic anemia: Secondary | ICD-10-CM

## 2017-03-03 LAB — CBC
HCT: 33.6 % — ABNORMAL LOW (ref 36.0–46.0)
HEMOGLOBIN: 10.9 g/dL — AB (ref 12.0–15.0)
MCH: 28.6 pg (ref 26.0–34.0)
MCHC: 32.4 g/dL (ref 30.0–36.0)
MCV: 88.2 fL (ref 78.0–100.0)
PLATELETS: 381 10*3/uL (ref 150–400)
RBC: 3.81 MIL/uL — AB (ref 3.87–5.11)
RDW: 14.3 % (ref 11.5–15.5)
WBC: 17.7 10*3/uL — ABNORMAL HIGH (ref 4.0–10.5)

## 2017-03-03 LAB — BASIC METABOLIC PANEL
ANION GAP: 10 (ref 5–15)
BUN: 18 mg/dL (ref 6–20)
CHLORIDE: 105 mmol/L (ref 101–111)
CO2: 24 mmol/L (ref 22–32)
CREATININE: 1.06 mg/dL — AB (ref 0.44–1.00)
Calcium: 8.9 mg/dL (ref 8.9–10.3)
GFR calc non Af Amer: >60 mL/min (ref >60)
Glucose, Bld: 112 mg/dL — ABNORMAL HIGH (ref 65–99)
POTASSIUM: 3.8 mmol/L (ref 3.5–5.1)
SODIUM: 139 mmol/L (ref 135–145)

## 2017-03-03 LAB — GLUCOSE, CAPILLARY
GLUCOSE-CAPILLARY: 146 mg/dL — AB (ref 65–99)
GLUCOSE-CAPILLARY: 76 mg/dL (ref 65–99)
Glucose-Capillary: 112 mg/dL — ABNORMAL HIGH (ref 65–99)
Glucose-Capillary: 143 mg/dL — ABNORMAL HIGH (ref 65–99)

## 2017-03-03 MED ORDER — TRIPLE ANTIBIOTIC 3.5-400-5000 EX OINT
1.0000 "application " | TOPICAL_OINTMENT | Freq: Once | CUTANEOUS | Status: DC | PRN
  Filled 2017-03-03: qty 1

## 2017-03-03 MED ORDER — LIDOCAINE HCL 2 % EX GEL: 1.0000 "application " | Freq: Once | CUTANEOUS | Status: DC | PRN

## 2017-03-03 MED ORDER — CLONAZEPAM 0.5 MG PO TABS
0.5000 mg | ORAL_TABLET | Freq: Three times a day (TID) | ORAL | Status: DC | PRN
  Administered 2017-03-03 (×2): 0.5 mg via ORAL
  Filled 2017-03-03 (×2): qty 1

## 2017-03-03 MED ORDER — GI COCKTAIL ~~LOC~~
30.0000 mL | Freq: Once | ORAL | Status: AC
  Administered 2017-03-03: 30 mL via ORAL
  Filled 2017-03-03: qty 30

## 2017-03-03 MED ORDER — OXYMETAZOLINE HCL 0.05 % NA SOLN
1.0000 | Freq: Once | NASAL | Status: DC | PRN
  Filled 2017-03-03: qty 15

## 2017-03-03 MED ORDER — PANTOPRAZOLE SODIUM 40 MG PO TBEC
40.0000 mg | DELAYED_RELEASE_TABLET | Freq: Two times a day (BID) | ORAL | Status: DC
  Administered 2017-03-03: 40 mg via ORAL
  Filled 2017-03-03 (×2): qty 1

## 2017-03-03 MED ORDER — LIDOCAINE-EPINEPHRINE (PF) 1 %-1:200000 IJ SOLN
0.0000 mL | Freq: Once | INTRAMUSCULAR | Status: DC | PRN
  Filled 2017-03-03: qty 30

## 2017-03-03 MED ORDER — IOPAMIDOL (ISOVUE-300) INJECTION 61%
INTRAVENOUS | Status: AC
  Filled 2017-03-03: qty 30

## 2017-03-03 MED ORDER — IOPAMIDOL (ISOVUE-300) INJECTION 61%
INTRAVENOUS | Status: AC
  Administered 2017-03-03: 100 mL
  Filled 2017-03-03: qty 75

## 2017-03-03 MED ORDER — SILVER NITRATE-POT NITRATE 75-25 % EX MISC
1.0000 | Freq: Once | CUTANEOUS | Status: DC | PRN
  Filled 2017-03-03: qty 1

## 2017-03-03 MED ORDER — LIDOCAINE HCL 4 % EX SOLN
0.0000 mL | Freq: Once | CUTANEOUS | Status: DC | PRN
  Filled 2017-03-03: qty 50

## 2017-03-03 NOTE — Progress Notes (Signed)
Received in report that patient coughed out something that look like a foreign substance and that the PA was inform. At around 8 PM, patient coughed out the same substance. At around 12 am she complained that she feels that something is in her throat and was chocking her. Assessment done, Vitals taken and was stable, patient anxious but was talkng with family member. RR paged and on call notified. On call ordered Klonopin PRN and GI cocktail once. Order carried out per protocol. Around 2 am patient was up walking with family member and few min later she coughed out the same substance again and she said it's feel like the stuff is out but sooner after that she started coughing again but later she was able to sleep and stays asleep. We continue to monitor.

## 2017-03-03 NOTE — Progress Notes (Signed)
Speech Language Pathology Daily Session Note  Patient Details  Name: [name] MRN: [id number] Date of Birth: [birth year]  Today's Date: 03/03/2017 SLP Individual Time: 0805-0900 SLP Individual Time Calculation (min): 55 min  Short Term Goals: Week 1: SLP Short Term Goal 1 (Week 1): Paitent will demonstrate timely mastication and oral clearance of regular textures with use of no more than 1 thin liquid wash Mod I.    SLP Short Term Goal 2 (Week 1): Patient will recall new complex information such as medications, functions, and frequencies with Sueprvision level question cues.  SLP Short Term Goal 3 (Week 1): Patient will demonstrate self-monitoring and correcting of errors during complex problem solving tasks with Supervision level question cues.  SLP Short Term Goal 4 (Week 1): Patient will recall vocal hygiene procedures/recommendations with Supervision level question cues.    Skilled Therapeutic Interventions:  Pt was seen for skilled ST targeting cognitive goals.  Upon arrival, pt was sitting upright in bed with complaints of 10/10 headache and reports of coughing up pieces of foreign objects.  Pt had collected a sample of foreign materials and showed therapist.  Given discomfort due to the above as well as nausea, pt declined POs at this time.  SLP facilitated the session with money management tasks to address further diagnostic treatment of cognition.  Pt was able to count money, generate values of money when named, and make change for 80% accuracy independently which improved to 100% accuracy with supervision cues to monitor and correct minor errors.  Pt and pt's husband both reported pt to be back to baseline for cognition.  Pt was left in bed with husband at bedside.   Pt is wanting to go home tomorrow.  Discussed with CSW. Continue per current plan of care.       Function:  Eating Eating                 Cognition Comprehension Comprehension assist level:  Follows complex conversation/direction with extra time/assistive device  Expression   Expression assist level: Expresses complex ideas: With extra time/assistive device  Social Interaction Social Interaction assist level: Interacts appropriately with others with medication or extra time (anti-anxiety, antidepressant).  Problem Solving Problem solving assist level: Solves complex 90% of the time/cues < 10% of the time  Memory Memory assist level: More than reasonable amount of time    Pain Pain Assessment Pain Assessment: 0-10 Pain Score: 10-Worst pain ever Pain Location: Head Pain Descriptors / Indicators: Headache Pain Intervention(s): RN made aware  Therapy/Group: Individual Therapy  [name], Selinda Orion 03/03/2017, 8:53 AM

## 2017-03-03 NOTE — Progress Notes (Signed)
[facility] PHYSICAL MEDICINE & REHABILITATION     PROGRESS NOTE  Subjective/Complaints:  Pt seen sitting up in bed this AM. She slept fairly overnight.  Per nursing pt coughed up foreign body, which was saved.    ROS:  +Cough, SOB. Denies CP, N/V/D.  Objective: Vital Signs: Blood pressure (!) 165/88, pulse 89, temperature 98.6 F (37 C), temperature source Oral, resp. rate 20, height 5\' 1"  (1.549 m), weight 101.7 kg (224 lb 3 oz), SpO2 95 %. No results found.  Recent Labs  03/02/17 0515 03/03/17 0507  WBC 19.1* 17.7*  HGB 10.2* 10.9*  HCT 31.5* 33.6*  PLT 354 381    Recent Labs  03/02/17 0515 03/03/17 0507  NA 136 139  K 3.4* 3.8  CL 105 105  GLUCOSE 117* 112*  BUN 22* 18  CREATININE 1.09* 1.06*  CALCIUM 8.5* 8.9   CBG (last 3)   Recent Labs  03/02/17 1718 03/02/17 2108 03/03/17 0608  GLUCAP 272* 165* 112*    Wt Readings from Last 3 Encounters:  02/28/17 101.7 kg (224 lb 3 oz)  02/27/17 105.7 kg (233 lb 0.4 oz)  01/05/17 108.7 kg (239 lb 9.6 oz)    Physical Exam:  BP (!) 165/88 (BP Location: Right Arm)   Pulse 89   Temp 98.6 F (37 C) (Oral)   Resp 20   Ht 5\' 1"  (1.549 m)   Wt 101.7 kg (224 lb 3 oz)   SpO2 95%   BMI 42.36 kg/m  Constitutional: She appears well-developed. Obese. NAD. HENT: Normocephalic and atraumatic.  Eyes: EOMI. No discharge.  Cardiovascular: RRR. No JVD. Respiratory: SOB. No stridor.  GI: Soft. Bowel sounds are normal.  Musculoskeletal: She exhibits edema and tenderness.  Neurological: She is alert and oriented.  Motor: 4+/5 throughout  Skin: Skin is warm and dry.  Psychiatric: Her speech is normal. Her mood appears anxious. She is agitated. Cognition and memory are not impaired.   Assessment/Plan: 1. Functional deficits secondary to debility which require 3+ hours per day of interdisciplinary therapy in a comprehensive inpatient rehab setting. Physiatrist is providing close team supervision and 24 hour management of  active medical problems listed below. Physiatrist and rehab team continue to assess barriers to discharge/monitor patient progress toward functional and medical goals.  Function:  Bathing Bathing position   Position: Shower  Bathing parts Body parts bathed by patient: Right arm, Left arm, Chest, Abdomen, Front perineal area, Buttocks, Right upper leg, Left upper leg Body parts bathed by helper: Left lower leg, Back, Right lower leg  Bathing assist Assist Level: Touching or steadying assistance(Pt > 75%)      Upper Body Dressing/Undressing Upper body dressing   What is the patient wearing?: Bra, Pull over shirt/dress Bra - Perfomed by patient: Thread/unthread right bra strap, Thread/unthread left bra strap Bra - Perfomed by helper: Hook/unhook bra (pull down sports bra) Pull over shirt/dress - Perfomed by patient: Thread/unthread right sleeve, Thread/unthread left sleeve Pull over shirt/dress - Perfomed by helper: Put head through opening        Upper body assist Assist Level: Touching or steadying assistance(Pt > 75%)      Lower Body Dressing/Undressing Lower body dressing   What is the patient wearing?: Pants, Non-skid slipper socks     Pants- Performed by patient: Pull pants up/down, Thread/unthread right pants leg, Thread/unthread left pants leg (A to bring leg to seated figure 4)   Non-skid slipper socks- Performed by patient: Don/doff right sock, Don/doff left sock  Lower body assist Assist for lower body dressing: Touching or steadying assistance (Pt > 75%)      Toileting Toileting   Toileting steps completed by patient: Adjust clothing prior to toileting, Performs perineal hygiene, Adjust clothing after toileting Toileting steps completed by helper: Adjust clothing prior to toileting, Adjust clothing after toileting Toileting Assistive Devices: Grab bar or rail  Toileting assist Assist level: Touching or steadying assistance (Pt.75%)    Transfers Chair/bed transfer   Chair/bed transfer method: Ambulatory Chair/bed transfer assist level: Supervision or verbal cues Chair/bed transfer assistive device: Armrests     Locomotion Ambulation     Max distance: 220 ft Assist level: Supervision or verbal cues   Wheelchair Wheelchair activity did not occur: Safety/medical concerns        Cognition Comprehension Comprehension assist level: Follows complex conversation/direction with extra time/assistive device  Expression Expression assist level: Expresses complex ideas: With extra time/assistive device  Social Interaction Social Interaction assist level: Interacts appropriately with others with medication or extra time (anti-anxiety, antidepressant).  Problem Solving Problem solving assist level: Solves complex 90% of the time/cues < 10% of the time  Memory Memory assist level: More than reasonable amount of time      Medical Problem List and Plan:  1. Functional and mobility deficits secondary to debility Cont CIR 2. DVT Prophylaxis/Anticoagulation: Pharmaceutical: Lovenox  3. Chronic back pain with radiculopathy/Pain Management: IV fentanyl discontinued.   Hydrocodone---pt took this at home q6 prn  4. Mood: team to provide ego support. Continue zoloft. LCSW to follow for evaluation and support.   Anxiolytic started for anxiety 5. Neuropsych: This patient is capable of making decisions on her own behalf.  6. Skin/Wound Care: Routine pressure relief measures.  7. Fluids/Electrolytes/Nutrition: Monitor I/Os.  8. Asthma exacerbation: Completed steroids. Continue brovana and duonebs. Encourage aggressive pulmonary hygiene.   Seen by Pulm, appreciate recs 9. [place]axella HCAP: completed Zithromax 6/9.  10. AKI on ?CKD: Avoid nephrotoxic drugs  Cr 1.06 on 6/12  Cont to monitor 11.HTN: Monitor BP bid.  On Norvasc  Losartan 25 started on 6/11 (PTA 100)   Will consider further increase tomorrow 12. T2DM: Monitor BS ac/hs.  Resumed prandin and dulaglutide.   Meds adjusted yesterday, improving 13. Morbid obesity: diet education for weight loss and to promoted over all health.  14. Leucocytosis: Likely reactive due to infection and steroids. Continue to monitor.   WBCs 17.7 on 6/12  Afebrile  Cont to monitor  Will cont to monitor now that steroids completed 15. Severe Throat pain: Added Carafate ac/hs with MMW prn.  16. Migraines: has been refusing Topamax. Continue Imitrex prn.  17. Neuropathy: Increased gabapentin to tid to help manage dysesthesias RUE  18. Hypoalbuminemia  Supplement initiated 6/9 19. Hemoptysis  CXR reviewed, improved  Neck xray reviewed, unremarkable  PPI increased to BID  Throat spray/lozanges ordered  Spoke with GI, will consider Pulm eval if warranted  With foreign body expectorated, will send for analysis  Will discuss with ENT  20. ABLA  Hb 10.9 on 6/12  Cont to monitor  LOS (Days) 4 A FACE TO FACE EVALUATION WAS PERFORMED  Zeus Marquis Lorie Phenix 03/03/2017 9:41 AM

## 2017-03-03 NOTE — Progress Notes (Signed)
Occupational Therapy Discharge Summary  Patient Details  Name: [name] MRN: [id number] Date of Birth: 03-19-1971  Today's Date: 03/03/2017 OT Individual Time: 2993-7169 OT Individual Time Calculation (min): 90 min   OT session completed with focus on ADL completion, increasing dynamic standing balance and activity tolerance. Pt completed bathing seated in shower with Mod independent, completed UB/LB dressing and standing grooming ADLs at Mod Independent level. Pt requires frequent seated rest breaks during ADL task completion, O2 monitored and remaining at 99%-100% on RA during task completion. Educated Pt on energy conservation tasks for ADL/IADL completion after return home. Pt completed hallway level functional mobility with supervision to rehab gym and then to [facility] rehab gym, completed seated activity on the nu-step approx 10 min with intermittent rest breaks. Pt also completed dynamic standing therapeutic activities including, tapping LEs up and down on step stool, biodex, and use of the dynavision where Pt stood on foam mat with supervision and with focus on dynamic balance during activity completion. Ended session with Pt sitting up in recliner in room, call bell and needs within reach.  Patient has met 11 of 11 long term goals due to improved activity tolerance, improved balance, postural control and ability to compensate for deficits.  Patient to discharge at overall Modified Independent level.  Patient's care partner is independent to provide the necessary physical assistance at discharge.    Reasons goals not met: NA  Recommendation:  Patient will benefit from ongoing skilled OT services in No additional OT services needed  to continue to advance functional skills in the area of NA.  Equipment: No equipment provided  Reasons for discharge: treatment goals met  Patient/family agrees with progress made and goals achieved: Yes  OT  Discharge Precautions/Restrictions  Precautions Precautions: Fall Restrictions Weight Bearing Restrictions: No   Pain Pain Assessment Pain Assessment: Faces Faces Pain Scale: Hurts a little bit Pain Type: Acute pain Pain Location: Throat Pain Descriptors / Indicators: Sore;Tiring Pain Onset: On-going Pain Intervention(s): RN made aware;Emotional support ADL ADL ADL Comments: see functional navigator  Vision Baseline Vision/History: Wears glasses Wears Glasses: Reading only Patient Visual Report: No change from baseline Vision Assessment?: No apparent visual deficits Perception  Perception: Within Functional Limits Praxis Praxis: Intact Cognition Overall Cognitive Status: Within Functional Limits for tasks assessed Arousal/Alertness: Awake/alert Orientation Level: Oriented X4 Attention: Selective Selective Attention: Appears intact Memory: Appears intact Awareness: Appears intact Problem Solving: Appears intact Self Monitoring: Appears intact Safety/Judgment: Appears intact Comments: Pt recognizing when she needs to take rest breaks and initiating doing so  Sensation Sensation Hot/Cold: Appears Intact Proprioception: Appears Intact Coordination Gross Motor Movements are Fluid and Coordinated: Yes Fine Motor Movements are Fluid and Coordinated: Yes Coordination and Movement Description: appear WFL for functional tasks assessed  Motor  Motor Motor: Within Functional Limits Motor - Discharge Observations: [facility] for functional tasks completed Mobility  Transfers Transfers: Sit to Stand;Stand to Sit Sit to Stand: 6: Modified independent (Device/Increase time);With armrests Stand to Sit: 6: Modified independent (Device/Increase time);With armrests  Trunk/Postural Assessment  Cervical Assessment Cervical Assessment: Within Functional Limits Thoracic Assessment Thoracic Assessment: Exceptions to [facility] (rounded shoulders but does not inhibit functional task completion  ) Lumbar Assessment Lumbar Assessment: Within Functional Limits Postural Control Postural Control: Within Functional Limits  Balance Balance Balance Assessed: Yes Dynamic Sitting Balance Dynamic Sitting - Balance Support: Feet supported Dynamic Sitting - Level of Assistance: 6: Modified independent (Device/Increase time) Dynamic Sitting - Balance Activities: Reaching for objects;Reaching across midline;Lateral lean/weight shifting Sitting  balance - Comments: Pt sitting EOB to complete UB/LB dressing with no LOB noted while reaching to obtain items on bedside table  Dynamic Standing Balance Dynamic Standing - Balance Support: No upper extremity supported;Bilateral upper extremity supported Dynamic Standing - Level of Assistance: 5: Stand by assistance Dynamic Standing - Balance Activities: Reaching for objects;Forward lean/weight shifting Dynamic Standing - Comments: during standing grooming ADLs, standing therapeutic activities with/without UE support  Extremity/Trunk Assessment RUE Assessment RUE Assessment: Within Functional Limits LUE Assessment LUE Assessment: Within Functional Limits   See Function Navigator for Current Functional Status.  Raymondo Band 03/03/2017, 4:27 PM

## 2017-03-03 NOTE — Progress Notes (Signed)
Called by bedside RN reference pt concern about foreign body in throat.  Pt was in no acute distress during encounter, VSS, although she appeared very anxious.  Pt's husbands concerned that a piece of her endotracheal tube had broken off while she was in ICU, and feels it may be stuck in her throat now.  The husband proceeded to show me pictures of the pieces of foreign body she has been coughing up for over a day now. Chart review shows a soft tissue neck xray from 6/10 with no acute abnormalities.    I recommended that the bedside RN contact the primary MD for further guidance.  A GI cocktail and klonopin 0.5 mg were ordered by the MD.  I offered humidified O2 for comfort and repositioning, pt refused.  Pt adamant on having a bronchoscopy done to figure out what is causing the sensation in her throat.  I advised that she discuss this with the rounding team in the morning.    Pt remains on our radar, we will continue to follow.

## 2017-03-03 NOTE — Progress Notes (Signed)
Physical Therapy Discharge Summary  Patient Details  Name: [name] MRN: [id number] Date of Birth: [birth year]  Today's Date: 03/03/2017 PT Individual Time: 1300-1400 PT Individual Time Calculation (min): 60 min    Patient has met 9 of 9 long term goals due to improved activity tolerance, improved balance and increased strength.  Patient to discharge at an ambulatory level Modified Independent.   Patient's care partner is independent to provide the necessary physical assistance at discharge. Spouse present for PT sessions, denies any questions or concerns.    Recommendation:  No ongoing PT services are needed at this time.  Equipment: No equipment provided  Reasons for discharge: treatment goals met  Patient/family agrees with progress made and goals achieved: Yes  Session: Pt sitting EOB upon arrival, agreeable to PT session with encouragement. Pt ambulating in room independently, not using any assistive device. Ambulation performed during session X220 ft X2 plus multiple shorter ambulation distances and surfaces. Pt able to ambulate on carpet and simulated home environment. Exercise: quad sets, LAQ, knee flexion, ball squeeze, hip abduction seated, SAQ all bilateral 1X10. Bed mobility: mod I. Transfers from mat table, couch, bed at mod I level. Following session, pt returned to room with family present and all needs in reach. Pt denies any questions or concerns at this time.     PT Discharge Precautions/Restrictions Precautions Precautions: Fall Restrictions Weight Bearing Restrictions: No Vital Signs SpO2 96-98% following ambulation.  Pain Denies pain, monitored during session.  Vision/Perception  Vision - History Baseline Vision: No visual deficits Vision - Assessment Eye Alignment: Within Functional Limits Perception Perception: Within Functional Limits Praxis Praxis: Intact  Cognition Overall Cognitive Status: Within Functional Limits for tasks  assessed Arousal/Alertness: Awake/alert Orientation Level: Oriented X4 Attention: Selective Selective Attention: Appears intact Memory: Appears intact Awareness: Appears intact Problem Solving: Appears intact Self Monitoring: Appears intact Safety/Judgment: Appears intact Comments: Pt recognizing when she needs to take rest breaks and initiating doing so  Sensation Sensation Light Touch: Appears Intact Hot/Cold: Appears Intact Proprioception: Appears Intact Coordination Gross Motor Movements are Fluid and Coordinated: Yes Fine Motor Movements are Fluid and Coordinated: Yes Coordination and Movement Description: appear WFL for functional tasks assessed  Motor  Motor Motor: Within Functional Limits Motor - Discharge Observations: [facility] for functional tasks completed  Mobility Transfers Sit to Stand: 6: Modified independent (Device/Increase time);With armrests Stand to Sit: 6: Modified independent (Device/Increase time);With armrests    Trunk/Postural Assessment  Cervical Assessment Cervical Assessment: Within Functional Limits Thoracic Assessment Thoracic Assessment: Exceptions to [facility] (rounded shoulders but does not inhibit functional task completion ) Lumbar Assessment Lumbar Assessment: Within Functional Limits Postural Control Postural Control: Within Functional Limits  Balance Balance Balance Assessed: Yes Dynamic Sitting Balance Dynamic Sitting - Balance Support: Feet supported Dynamic Sitting - Level of Assistance: 7: Independent Dynamic Sitting - Balance Activities: Reaching for objects;Reaching across midline;Lateral lean/weight shifting Sitting balance - Comments: Pt sitting EOB to complete UB/LB dressing with no LOB noted while reaching to obtain items on bedside table  Dynamic Standing Balance Dynamic Standing - Balance Support: No upper extremity supported;Bilateral upper extremity supported Dynamic Standing - Level of Assistance: 7: Independent Dynamic  Standing - Balance Activities: Reaching for objects;Forward lean/weight shifting Dynamic Standing - Comments: during standing grooming ADLs, standing therapeutic activities with/without UE support  Extremity Assessment  RUE Assessment RUE Assessment: Within Functional Limits LUE Assessment LUE Assessment: Within Functional Limits RLE Assessment RLE Assessment: Within Functional Limits LLE Assessment LLE Assessment: Within Functional Limits   See  Function Navigator for Current Functional Status.  Linard Millers, PT 03/03/2017, 5:15 PM

## 2017-03-03 NOTE — Consult Note (Signed)
[name],  [name] 46 y.o., female [id number]     Chief Complaint: hemoptysis, foreign  Body production by cough, stridor and dyspnea,  Hoarseness, dysphagia  HPI: 46 yo bf, admitted ~2weeks ago with a bad asthma attack requiring intubation x 2 days.  Upon extubation, having throat pain, dyspnea, stridor, dysphagia, hemoptysis.  Yesterday ?coughed up 2-3 pieces of foreign material.  She was concerned this might be part of her ET tube.  Pathology thought it was some sort of non specific plastic material.  She had felt like there was something in her throat, which is incompletely better now.  She can swallow soft solids painfully.  Possible slight aspiration.  She has known hx GERD but has been off therapy for some time with no reported active symptoms.  She is now on Protonix 40 mg bid.  No more smoking.    PMH: Past Medical History:  Diagnosis Date  . Arthritis   . Asthma   . Chronic back pain   . Chronic chest pain   . Depression   . DM (diabetes mellitus) ([place])    INSULIN DEPENDENT  . GERD (gastroesophageal reflux disease)   . Headache(784.0)   . Hypertension   . Hypokalemia     Surg Hx: Past Surgical History:  Procedure Laterality Date  . APPENDECTOMY    . CESAREAN SECTION     x 3  . CHOLECYSTECTOMY N/A 03/02/2014   Procedure: LAPAROSCOPIC CHOLECYSTECTOMY;  Surgeon: Joyice Faster. Cornett, MD;  Location: [facility];  Service: General;  Laterality: N/A;  . HERNIA REPAIR    . ROTATOR CUFF REPAIR    . VESICOVAGINAL FISTULA CLOSURE W/ TAH  2009    FHx:   Family History  Problem Relation Age of Onset  . Heart attack Mother   . Stroke Mother   . Diabetes Mother   . Hypertension Mother   . Arthritis Mother   . Stroke Father   . Hypertension Sister   . Diabetes Sister   . Seizures Brother   . Diabetes Brother    SocHx:  reports that she has been smoking Cigarettes.  She has a 5.50 pack-year smoking history. She has never used smokeless tobacco. She reports that she drinks  about 0.6 - 1.2 oz of alcohol per week . She reports that she does not use drugs.  ALLERGIES:  Allergies  Allergen Reactions  . Reglan [Metoclopramide] Other (See Comments)    Panic attack  . Topamax [Topiramate]     Per patient, face swells and fingers tingles    Medications Prior to Admission  Medication Sig Dispense Refill  . acetaminophen-codeine (TYLENOL #3) 300-30 MG tablet Take 1 tablet by mouth every 12 (twelve) hours as needed for moderate pain. 30 tablet 0  . albuterol (PROVENTIL HFA;VENTOLIN HFA) 108 (90 Base) MCG/ACT inhaler Inhale 2 puffs into the lungs every 6 (six) hours as needed for wheezing or shortness of breath. Reported on 02/22/2016 1 each 3  . albuterol (PROVENTIL) (2.5 MG/3ML) 0.083% nebulizer solution Take 3 mLs (2.5 mg total) by nebulization every 6 (six) hours as needed for wheezing or shortness of breath. Reported on 02/22/2016 75 mL 3  . amLODipine (NORVASC) 5 MG tablet Take 1 tablet (5 mg total) by mouth daily. 90 tablet 1  . arformoterol (BROVANA) 15 MCG/2ML NEBU Take 2 mLs (15 mcg total) by nebulization 2 (two) times daily. 120 mL 0  . aspirin EC 325 MG tablet Take 325 mg by mouth daily.    . Blood Glucose  Monitoring Suppl (ACCU-CHEK AVIVA PLUS) w/Device KIT Use to check blood sugar 3 times per day. DX CODE: E11.9 (Patient not taking: Reported on 02/19/2017) 1 kit 2  . bromocriptine (PARLODEL) 2.5 MG tablet 1/4 tab daily (Patient taking differently: Take 0.625 mg by mouth daily. ) 20 tablet 2  . budesonide (PULMICORT) 0.5 MG/2ML nebulizer solution Take 2 mLs (0.5 mg total) by nebulization 2 (two) times daily. 2 mL 12  . dapagliflozin propanediol (FARXIGA) 10 MG TABS tablet Take 10 mg by mouth daily. 30 tablet 11  . Dulaglutide (TRULICITY) 1.5 KY/7.0WC SOPN Inject 1.5 mg into the skin once a week. 4 pen 11  . gabapentin (NEURONTIN) 300 MG capsule Take 1 capsule (300 mg total) by mouth 3 (three) times daily. 90 capsule 3  . glucose blood (ACCU-CHEK AVIVA PLUS) test  strip Use to check blood sugar 3 times per day.DX Code E11.9 (Patient not taking: Reported on 02/19/2017) 100 each 12  . Insulin Pen Needle 32G X 4 MM MISC Use to inject insulin 3 times per day. (Patient not taking: Reported on 02/19/2017) 100 each 2  . Lancet Devices (ACCU-CHEK SOFTCLIX) lancets Use to check blood sugar 3 times per day dx code E11.9 (Patient not taking: Reported on 02/19/2017) 100 each 2  . Lancets (ACCU-CHEK MULTICLIX) lancets Use to check blood sugar 3 times per day. DX code E11.9 (Patient not taking: Reported on 02/19/2017) 100 each 12  . methocarbamol (ROBAXIN) 500 MG tablet Take 1 tablet (500 mg total) by mouth every 8 (eight) hours as needed for muscle spasms. 90 tablet 1  . olopatadine (PATANOL) 0.1 % ophthalmic solution Place 1 drop into the right eye 2 (two) times daily. 5 mL 0  . oxyCODONE-acetaminophen (PERCOCET) 5-325 MG tablet Take 1-2 tablets by mouth every 4 (four) hours as needed. (Patient taking differently: Take 1-2 tablets by mouth every 4 (four) hours as needed for moderate pain. ) 20 tablet 0  . pantoprazole (PROTONIX) 40 MG tablet Take 1 tablet (40 mg total) by mouth daily. 30 tablet 3  . predniSONE (DELTASONE) 50 MG tablet Patient will take for 3 more days 50 mg a day 3 tablet 0  . prochlorperazine (COMPAZINE) 10 MG tablet Take 1 tablet (10 mg total) by mouth 2 (two) times daily as needed for nausea or vomiting. 10 tablet 0  . repaglinide (PRANDIN) 2 MG tablet 1 tab with breakfast and lunch, and 2 tabs with dinner. (Patient taking differently: Take 2-4 mg by mouth See admin instructions. Take 2 mg by mouth with breakfast and lunch, and 4 mg by mouth with dinner.) 120 tablet 11  . sertraline (ZOLOFT) 50 MG tablet Take 1 tablet (50 mg total) by mouth daily. 30 tablet 3  . simvastatin (ZOCOR) 10 MG tablet Take 1 tablet (10 mg total) by mouth daily at 6 PM. 30 tablet 3  . SUMAtriptan (IMITREX) 100 MG tablet TAKE 1TAB AT EARLIEST ONSET OF HEADACHE. MAY REPEAT X1 IN 2  HOURS IF HEADACHE PERSISTS OR RECURS. DO NOT EXCEED 2 TABS IN 24HRS 9 tablet 2    Results for orders placed or performed during the hospital encounter of 02/27/17 (from the past 48 hour(s))  Glucose, capillary     Status: Abnormal   Collection Time: 03/01/17  9:25 PM  Result Value Ref Range   Glucose-Capillary 212 (H) 65 - 99 mg/dL  CBC     Status: Abnormal   Collection Time: 03/02/17  5:15 AM  Result Value Ref Range  WBC 19.1 (H) 4.0 - 10.5 K/uL   RBC 3.63 (L) 3.87 - 5.11 MIL/uL   Hemoglobin 10.2 (L) 12.0 - 15.0 g/dL   HCT 31.5 (L) 36.0 - 46.0 %   MCV 86.8 78.0 - 100.0 fL   MCH 28.1 26.0 - 34.0 pg   MCHC 32.4 30.0 - 36.0 g/dL   RDW 14.0 11.5 - 15.5 %   Platelets 354 150 - 400 K/uL  Basic metabolic panel     Status: Abnormal   Collection Time: 03/02/17  5:15 AM  Result Value Ref Range   Sodium 136 135 - 145 mmol/L   Potassium 3.4 (L) 3.5 - 5.1 mmol/L   Chloride 105 101 - 111 mmol/L   CO2 22 22 - 32 mmol/L   Glucose, Bld 117 (H) 65 - 99 mg/dL   BUN 22 (H) 6 - 20 mg/dL   Creatinine, Ser 1.09 (H) 0.44 - 1.00 mg/dL   Calcium 8.5 (L) 8.9 - 10.3 mg/dL   GFR calc non Af Amer 59 (L) >60 mL/min   GFR calc Af Amer >60 >60 mL/min    Comment: (NOTE) The eGFR has been calculated using the CKD EPI equation. This calculation has not been validated in all clinical situations. eGFR's persistently <60 mL/min signify possible Chronic Kidney Disease.    Anion gap 9 5 - 15  Glucose, capillary     Status: Abnormal   Collection Time: 03/02/17  6:41 AM  Result Value Ref Range   Glucose-Capillary 121 (H) 65 - 99 mg/dL  Glucose, capillary     Status: Abnormal   Collection Time: 03/02/17 11:39 AM  Result Value Ref Range   Glucose-Capillary 116 (H) 65 - 99 mg/dL  Culture, expectorated sputum-assessment     Status: None   Collection Time: 03/02/17  3:50 PM  Result Value Ref Range   Specimen Description EXPECTORATED SPUTUM    Special Requests NONE    Sputum evaluation THIS SPECIMEN IS  ACCEPTABLE FOR SPUTUM CULTURE    Report Status 03/02/2017 FINAL   Culture, respiratory (NON-Expectorated)     Status: None (Preliminary result)   Collection Time: 03/02/17  3:50 PM  Result Value Ref Range   Specimen Description EXPECTORATED SPUTUM    Special Requests NONE Reflexed from [id number]    Gram Stain      ABUNDANT WBC PRESENT, PREDOMINANTLY PMN NO SQUAMOUS EPITHELIAL CELLS SEEN ABUNDANT GRAM NEGATIVE COCCOBACILLI MODERATE GRAM POSITIVE COCCI IN PAIRS AND CHAINS    Culture CULTURE REINCUBATED FOR BETTER GROWTH    Report Status PENDING   Glucose, capillary     Status: Abnormal   Collection Time: 03/02/17  5:18 PM  Result Value Ref Range   Glucose-Capillary 272 (H) 65 - 99 mg/dL  Glucose, capillary     Status: Abnormal   Collection Time: 03/02/17  9:08 PM  Result Value Ref Range   Glucose-Capillary 165 (H) 65 - 99 mg/dL  Basic metabolic panel     Status: Abnormal   Collection Time: 03/03/17  5:07 AM  Result Value Ref Range   Sodium 139 135 - 145 mmol/L   Potassium 3.8 3.5 - 5.1 mmol/L   Chloride 105 101 - 111 mmol/L   CO2 24 22 - 32 mmol/L   Glucose, Bld 112 (H) 65 - 99 mg/dL   BUN 18 6 - 20 mg/dL   Creatinine, Ser 1.06 (H) 0.44 - 1.00 mg/dL   Calcium 8.9 8.9 - 10.3 mg/dL   GFR calc non Af Amer >60 >60 mL/min  GFR calc Af Amer >60 >60 mL/min    Comment: (NOTE) The eGFR has been calculated using the CKD EPI equation. This calculation has not been validated in all clinical situations. eGFR's persistently <60 mL/min signify possible Chronic Kidney Disease.    Anion gap 10 5 - 15  CBC     Status: Abnormal   Collection Time: 03/03/17  5:07 AM  Result Value Ref Range   WBC 17.7 (H) 4.0 - 10.5 K/uL   RBC 3.81 (L) 3.87 - 5.11 MIL/uL   Hemoglobin 10.9 (L) 12.0 - 15.0 g/dL   HCT 33.6 (L) 36.0 - 46.0 %   MCV 88.2 78.0 - 100.0 fL   MCH 28.6 26.0 - 34.0 pg   MCHC 32.4 30.0 - 36.0 g/dL   RDW 14.3 11.5 - 15.5 %   Platelets 381 150 - 400 K/uL  Glucose, capillary     Status:  Abnormal   Collection Time: 03/03/17  6:08 AM  Result Value Ref Range   Glucose-Capillary 112 (H) 65 - 99 mg/dL  Glucose, capillary     Status: Abnormal   Collection Time: 03/03/17 12:15 PM  Result Value Ref Range   Glucose-Capillary 146 (H) 65 - 99 mg/dL   No results found.  ROS:  Dyspneic with minimal exertion.  Dyspnea, stridor, and hoarseness started after extubation.  Blood pressure (!) 165/88, pulse 89, temperature 98.6 F (37 C), temperature source Oral, resp. rate 20, height 5' 1"  (1.549 m), weight 101.7 kg (224 lb 3 oz), SpO2 97 %.  PHYSICAL EXAM: Overall appearance:  Anxious and dyspneic with inspiratory stridor.  Voice is hoarse but phonatory.  She is not coughing or clearing her throat unusually.  Mental status is appropriate and intact.  She is moderately obese. Head:  NCAT Ears:some wax AD. Nl TM's Nose: mild RIGHT deviated nasal septum Oral Cavity: moist.  Teeth in good repair Oral Pharynx/Hypopharynx/Larynx: oropharynx fleshy.  Hypopharynx/larynx per flexible laryngoscope shows ulcerations on the posterior half of each vocal cord, RIGHT>LEFT not circumferential across the posterior commissure.  Cords are mobile and airway is reasonable.  Insp. Stridor noted.  Post cricoid edema.  No pooling in valleculae or pyriforms.  No foreign body or bleeding site noted. Neuro:  Grossly intact Neck:  Sl tender in thyroid region.  No masses.    Studies Reviewed:  CT chest/abdomen pending.    Assessment/Plan Intubation injury to bilateral vocal cords.  GERD.  No identified source for hemoptysis.  Did not recognize the foreign body in a photo sent by her husband.  This injury explains, pain, bleeding, dysphagia, hoarseness, stridor.  Does not obviously explain foreign body production.    Plan:  I will inspect the foreign materials in Pathology.  Given persistent symptoms, I recommend laryngoscopy, bronchoscopy, esophagoscopy under anesthesia.  This can wait until in the AM.  I  discussed this with her.  Questions were answered and informed consent was obtained.  Order written.    Jodi Marble 7/62/8315, 1:05 PM

## 2017-03-04 ENCOUNTER — Encounter (HOSPITAL_COMMUNITY): Payer: Self-pay | Admitting: Anesthesiology

## 2017-03-04 ENCOUNTER — Inpatient Hospital Stay (HOSPITAL_COMMUNITY): Payer: Medicare Other | Admitting: Speech Pathology

## 2017-03-04 ENCOUNTER — Inpatient Hospital Stay (HOSPITAL_COMMUNITY)
Admission: RE | Admit: 2017-03-04 | Discharge: 2017-03-06 | DRG: 208 | Disposition: A | Discharge status: Other Acute Inpatient Hospital | Payer: Medicare Other | Source: Intra-hospital | Attending: Otolaryngology | Admitting: Otolaryngology

## 2017-03-04 ENCOUNTER — Encounter (HOSPITAL_COMMUNITY)
Admission: RE | Disposition: A | Discharge status: Other Acute Inpatient Hospital | Payer: Self-pay | Source: Intra-hospital | Attending: Physical Medicine & Rehabilitation

## 2017-03-04 ENCOUNTER — Inpatient Hospital Stay (HOSPITAL_COMMUNITY): Payer: Medicare Other | Admitting: Anesthesiology

## 2017-03-04 ENCOUNTER — Inpatient Hospital Stay (HOSPITAL_COMMUNITY): Payer: Medicare Other | Admitting: Physical Therapy

## 2017-03-04 ENCOUNTER — Inpatient Hospital Stay (HOSPITAL_COMMUNITY): Payer: Medicare Other

## 2017-03-04 ENCOUNTER — Inpatient Hospital Stay (HOSPITAL_COMMUNITY): Admission: RE | Admit: 2017-03-04 | Payer: Medicare Other | Source: Ambulatory Visit | Admitting: Otolaryngology

## 2017-03-04 ENCOUNTER — Inpatient Hospital Stay (HOSPITAL_COMMUNITY): Payer: Medicare Other | Admitting: Occupational Therapy

## 2017-03-04 DIAGNOSIS — J398 Other specified diseases of upper respiratory tract: Secondary | ICD-10-CM | POA: Diagnosis not present

## 2017-03-04 DIAGNOSIS — D62 Acute posthemorrhagic anemia: Secondary | ICD-10-CM | POA: Diagnosis not present

## 2017-03-04 DIAGNOSIS — R042 Hemoptysis: Secondary | ICD-10-CM | POA: Diagnosis present

## 2017-03-04 DIAGNOSIS — T17808A Unspecified foreign body in other parts of respiratory tract causing other injury, initial encounter: Secondary | ICD-10-CM | POA: Diagnosis not present

## 2017-03-04 DIAGNOSIS — G6281 Critical illness polyneuropathy: Secondary | ICD-10-CM | POA: Diagnosis present

## 2017-03-04 DIAGNOSIS — T17200A Unspecified foreign body in pharynx causing asphyxiation, initial encounter: Secondary | ICD-10-CM | POA: Diagnosis not present

## 2017-03-04 DIAGNOSIS — E1122 Type 2 diabetes mellitus with diabetic chronic kidney disease: Secondary | ICD-10-CM | POA: Diagnosis not present

## 2017-03-04 DIAGNOSIS — Z79899 Other long term (current) drug therapy: Secondary | ICD-10-CM

## 2017-03-04 DIAGNOSIS — F17210 Nicotine dependence, cigarettes, uncomplicated: Secondary | ICD-10-CM | POA: Diagnosis present

## 2017-03-04 DIAGNOSIS — J9811 Atelectasis: Secondary | ICD-10-CM | POA: Diagnosis not present

## 2017-03-04 DIAGNOSIS — J9601 Acute respiratory failure with hypoxia: Secondary | ICD-10-CM | POA: Diagnosis not present

## 2017-03-04 DIAGNOSIS — G43909 Migraine, unspecified, not intractable, without status migrainosus: Secondary | ICD-10-CM | POA: Diagnosis present

## 2017-03-04 DIAGNOSIS — E46 Unspecified protein-calorie malnutrition: Secondary | ICD-10-CM | POA: Diagnosis present

## 2017-03-04 DIAGNOSIS — E785 Hyperlipidemia, unspecified: Secondary | ICD-10-CM | POA: Diagnosis not present

## 2017-03-04 DIAGNOSIS — E119 Type 2 diabetes mellitus without complications: Secondary | ICD-10-CM | POA: Diagnosis not present

## 2017-03-04 DIAGNOSIS — J189 Pneumonia, unspecified organism: Secondary | ICD-10-CM | POA: Diagnosis not present

## 2017-03-04 DIAGNOSIS — J9600 Acute respiratory failure, unspecified whether with hypoxia or hypercapnia: Secondary | ICD-10-CM

## 2017-03-04 DIAGNOSIS — J386 Stenosis of larynx: Secondary | ICD-10-CM | POA: Diagnosis not present

## 2017-03-04 DIAGNOSIS — R0602 Shortness of breath: Secondary | ICD-10-CM | POA: Diagnosis not present

## 2017-03-04 DIAGNOSIS — T17208A Unspecified foreign body in pharynx causing other injury, initial encounter: Secondary | ICD-10-CM | POA: Diagnosis not present

## 2017-03-04 DIAGNOSIS — F411 Generalized anxiety disorder: Secondary | ICD-10-CM | POA: Diagnosis not present

## 2017-03-04 DIAGNOSIS — Z4682 Encounter for fitting and adjustment of non-vascular catheter: Secondary | ICD-10-CM | POA: Diagnosis not present

## 2017-03-04 DIAGNOSIS — F329 Major depressive disorder, single episode, unspecified: Secondary | ICD-10-CM | POA: Diagnosis not present

## 2017-03-04 DIAGNOSIS — I10 Essential (primary) hypertension: Secondary | ICD-10-CM | POA: Diagnosis present

## 2017-03-04 DIAGNOSIS — G8929 Other chronic pain: Secondary | ICD-10-CM | POA: Diagnosis not present

## 2017-03-04 DIAGNOSIS — J9690 Respiratory failure, unspecified, unspecified whether with hypoxia or hypercapnia: Secondary | ICD-10-CM

## 2017-03-04 DIAGNOSIS — J45901 Unspecified asthma with (acute) exacerbation: Secondary | ICD-10-CM | POA: Diagnosis not present

## 2017-03-04 DIAGNOSIS — G7281 Critical illness myopathy: Secondary | ICD-10-CM | POA: Diagnosis present

## 2017-03-04 DIAGNOSIS — E1142 Type 2 diabetes mellitus with diabetic polyneuropathy: Secondary | ICD-10-CM | POA: Diagnosis present

## 2017-03-04 DIAGNOSIS — Z794 Long term (current) use of insulin: Secondary | ICD-10-CM

## 2017-03-04 DIAGNOSIS — R509 Fever, unspecified: Secondary | ICD-10-CM | POA: Diagnosis not present

## 2017-03-04 DIAGNOSIS — X58XXXA Exposure to other specified factors, initial encounter: Secondary | ICD-10-CM | POA: Diagnosis present

## 2017-03-04 DIAGNOSIS — J4552 Severe persistent asthma with status asthmaticus: Secondary | ICD-10-CM | POA: Diagnosis not present

## 2017-03-04 DIAGNOSIS — R918 Other nonspecific abnormal finding of lung field: Secondary | ICD-10-CM | POA: Diagnosis not present

## 2017-03-04 DIAGNOSIS — K219 Gastro-esophageal reflux disease without esophagitis: Secondary | ICD-10-CM | POA: Diagnosis not present

## 2017-03-04 DIAGNOSIS — I110 Hypertensive heart disease with heart failure: Secondary | ICD-10-CM | POA: Diagnosis not present

## 2017-03-04 DIAGNOSIS — J45909 Unspecified asthma, uncomplicated: Secondary | ICD-10-CM | POA: Diagnosis not present

## 2017-03-04 DIAGNOSIS — N3000 Acute cystitis without hematuria: Secondary | ICD-10-CM | POA: Diagnosis not present

## 2017-03-04 DIAGNOSIS — Z6841 Body Mass Index (BMI) 40.0 and over, adult: Secondary | ICD-10-CM | POA: Diagnosis not present

## 2017-03-04 DIAGNOSIS — A419 Sepsis, unspecified organism: Secondary | ICD-10-CM | POA: Diagnosis not present

## 2017-03-04 DIAGNOSIS — S1983XA Other specified injuries of vocal cord, initial encounter: Secondary | ICD-10-CM | POA: Diagnosis present

## 2017-03-04 DIAGNOSIS — R0989 Other specified symptoms and signs involving the circulatory and respiratory systems: Secondary | ICD-10-CM | POA: Diagnosis not present

## 2017-03-04 DIAGNOSIS — I5032 Chronic diastolic (congestive) heart failure: Secondary | ICD-10-CM | POA: Diagnosis not present

## 2017-03-04 DIAGNOSIS — G4733 Obstructive sleep apnea (adult) (pediatric): Secondary | ICD-10-CM | POA: Diagnosis not present

## 2017-03-04 DIAGNOSIS — Z430 Encounter for attention to tracheostomy: Secondary | ICD-10-CM | POA: Diagnosis not present

## 2017-03-04 DIAGNOSIS — Z7984 Long term (current) use of oral hypoglycemic drugs: Secondary | ICD-10-CM | POA: Diagnosis not present

## 2017-03-04 HISTORY — PX: PANENDOSCOPY: SHX2159

## 2017-03-04 LAB — GLUCOSE, CAPILLARY
GLUCOSE-CAPILLARY: 170 mg/dL — AB (ref 65–99)
GLUCOSE-CAPILLARY: 171 mg/dL — AB (ref 65–99)
Glucose-Capillary: 124 mg/dL — ABNORMAL HIGH (ref 65–99)
Glucose-Capillary: 137 mg/dL — ABNORMAL HIGH (ref 65–99)

## 2017-03-04 LAB — PHOSPHORUS: Phosphorus: 4.9 mg/dL — ABNORMAL HIGH (ref 2.5–4.6)

## 2017-03-04 LAB — BASIC METABOLIC PANEL
ANION GAP: 8 (ref 5–15)
BUN: 12 mg/dL (ref 6–20)
CHLORIDE: 105 mmol/L (ref 101–111)
CO2: 26 mmol/L (ref 22–32)
Calcium: 8.8 mg/dL — ABNORMAL LOW (ref 8.9–10.3)
Creatinine, Ser: 1.04 mg/dL — ABNORMAL HIGH (ref 0.44–1.00)
GFR calc Af Amer: >60 mL/min (ref >60)
GFR calc non Af Amer: >60 mL/min (ref >60)
Glucose, Bld: 202 mg/dL — ABNORMAL HIGH (ref 65–99)
POTASSIUM: 4.5 mmol/L (ref 3.5–5.1)
Sodium: 139 mmol/L (ref 135–145)

## 2017-03-04 LAB — CBC
HEMATOCRIT: 33.6 % — AB (ref 36.0–46.0)
HEMOGLOBIN: 10.8 g/dL — AB (ref 12.0–15.0)
MCH: 28.5 pg (ref 26.0–34.0)
MCHC: 32.1 g/dL (ref 30.0–36.0)
MCV: 88.7 fL (ref 78.0–100.0)
Platelets: 370 10*3/uL (ref 150–400)
RBC: 3.79 MIL/uL — AB (ref 3.87–5.11)
RDW: 14.4 % (ref 11.5–15.5)
WBC: 20.3 10*3/uL — AB (ref 4.0–10.5)

## 2017-03-04 LAB — TRIGLYCERIDES: Triglycerides: 190 mg/dL — ABNORMAL HIGH (ref <150)

## 2017-03-04 LAB — SURGICAL PCR SCREEN
MRSA, PCR: NEGATIVE
Staphylococcus aureus: NEGATIVE

## 2017-03-04 LAB — MAGNESIUM: Magnesium: 2 mg/dL (ref 1.7–2.4)

## 2017-03-04 SURGERY — LARYNGOSCOPY, WITH BRONCHOSCOPY AND ESOPHAGOSCOPY
Anesthesia: General | Site: Mouth

## 2017-03-04 MED ORDER — PHENYLEPHRINE HCL 10 MG/ML IJ SOLN
INTRAVENOUS | Status: DC | PRN
  Administered 2017-03-04: 25 ug/min via INTRAVENOUS

## 2017-03-04 MED ORDER — PANTOPRAZOLE SODIUM 40 MG IV SOLR
40.0000 mg | Freq: Every day | INTRAVENOUS | Status: DC
  Administered 2017-03-04 – 2017-03-06 (×3): 40 mg via INTRAVENOUS
  Filled 2017-03-04 (×3): qty 40

## 2017-03-04 MED ORDER — PROPOFOL 10 MG/ML IV BOLUS
INTRAVENOUS | Status: DC | PRN
  Administered 2017-03-04: 120 mg via INTRAVENOUS
  Administered 2017-03-04: 30 mg via INTRAVENOUS

## 2017-03-04 MED ORDER — FENTANYL CITRATE (PF) 100 MCG/2ML IJ SOLN
INTRAMUSCULAR | Status: DC | PRN
  Administered 2017-03-04: 50 ug via INTRAVENOUS

## 2017-03-04 MED ORDER — ROCURONIUM BROMIDE 100 MG/10ML IV SOLN
INTRAVENOUS | Status: DC | PRN
  Administered 2017-03-04: 20 mg via INTRAVENOUS
  Administered 2017-03-04: 30 mg via INTRAVENOUS
  Administered 2017-03-04: 10 mg via INTRAVENOUS
  Administered 2017-03-04: 70 mg via INTRAVENOUS

## 2017-03-04 MED ORDER — PROPOFOL 1000 MG/100ML IV EMUL
INTRAVENOUS | Status: AC
  Filled 2017-03-04: qty 100

## 2017-03-04 MED ORDER — SODIUM CHLORIDE 0.9 % IV BOLUS (SEPSIS)
1000.0000 mL | Freq: Once | INTRAVENOUS | Status: AC
  Administered 2017-03-04: 1000 mL via INTRAVENOUS

## 2017-03-04 MED ORDER — FENTANYL CITRATE (PF) 100 MCG/2ML IJ SOLN
100.0000 ug | INTRAMUSCULAR | Status: DC | PRN
  Administered 2017-03-05: 100 ug via INTRAVENOUS
  Filled 2017-03-04: qty 2

## 2017-03-04 MED ORDER — LACTATED RINGERS IV SOLN
INTRAVENOUS | Status: DC
  Administered 2017-03-04 (×2): via INTRAVENOUS

## 2017-03-04 MED ORDER — ALBUTEROL SULFATE (2.5 MG/3ML) 0.083% IN NEBU: 2.5000 mg | INHALATION_SOLUTION | RESPIRATORY_TRACT | Status: DC | PRN

## 2017-03-04 MED ORDER — INSULIN ASPART 100 UNIT/ML ~~LOC~~ SOLN
0.0000 [IU] | SUBCUTANEOUS | Status: DC
  Administered 2017-03-04 – 2017-03-05 (×3): 4 [IU] via SUBCUTANEOUS
  Administered 2017-03-05: 3 [IU] via SUBCUTANEOUS

## 2017-03-04 MED ORDER — BUDESONIDE 0.5 MG/2ML IN SUSP
0.5000 mg | Freq: Two times a day (BID) | RESPIRATORY_TRACT | Status: DC
  Administered 2017-03-04 – 2017-03-06 (×4): 0.5 mg via RESPIRATORY_TRACT
  Filled 2017-03-04 (×5): qty 2

## 2017-03-04 MED ORDER — CHLORHEXIDINE GLUCONATE 0.12% ORAL RINSE (MEDLINE KIT)
15.0000 mL | Freq: Two times a day (BID) | OROMUCOSAL | Status: DC
  Administered 2017-03-04 – 2017-03-06 (×4): 15 mL via OROMUCOSAL

## 2017-03-04 MED ORDER — PHENYLEPHRINE HCL 10 MG/ML IJ SOLN
INTRAMUSCULAR | Status: DC | PRN
  Administered 2017-03-04 (×2): 120 ug via INTRAVENOUS

## 2017-03-04 MED ORDER — 0.9 % SODIUM CHLORIDE (POUR BTL) OPTIME
TOPICAL | Status: DC | PRN
  Administered 2017-03-04: 1000 mL

## 2017-03-04 MED ORDER — PROPOFOL 1000 MG/100ML IV EMUL
0.0000 ug/kg/min | INTRAVENOUS | Status: DC
  Administered 2017-03-04 (×2): 50 ug/kg/min via INTRAVENOUS
  Administered 2017-03-05 (×4): 40 ug/kg/min via INTRAVENOUS
  Administered 2017-03-05: 45 ug/kg/min via INTRAVENOUS
  Administered 2017-03-05: 40 ug/kg/min via INTRAVENOUS
  Administered 2017-03-06 (×2): 45 ug/kg/min via INTRAVENOUS
  Administered 2017-03-06: 25 ug/kg/min via INTRAVENOUS
  Filled 2017-03-04 (×13): qty 100

## 2017-03-04 MED ORDER — LACTATED RINGERS IV SOLN
INTRAVENOUS | Status: AC
  Administered 2017-03-04: 15:00:00 via INTRAVENOUS

## 2017-03-04 MED ORDER — OXYMETAZOLINE HCL 0.05 % NA SOLN
NASAL | Status: AC
  Filled 2017-03-04: qty 15

## 2017-03-04 MED ORDER — LOSARTAN POTASSIUM 50 MG PO TABS: 50.0000 mg | ORAL_TABLET | Freq: Every day | ORAL | Status: DC

## 2017-03-04 MED ORDER — SERTRALINE HCL 20 MG/ML PO CONC: 50.0000 mg | Freq: Every day | ORAL | Status: DC

## 2017-03-04 MED ORDER — IOPAMIDOL (ISOVUE-300) INJECTION 61%
INTRAVENOUS | Status: AC
  Administered 2017-03-04: 75 mL
  Filled 2017-03-04: qty 100

## 2017-03-04 MED ORDER — MIDAZOLAM HCL 5 MG/5ML IJ SOLN
INTRAMUSCULAR | Status: DC | PRN
  Administered 2017-03-04: 2 mg via INTRAVENOUS

## 2017-03-04 MED ORDER — PROPOFOL 500 MG/50ML IV EMUL
INTRAVENOUS | Status: DC | PRN
  Administered 2017-03-04: 50 ug/kg/min via INTRAVENOUS

## 2017-03-04 MED ORDER — OXYMETAZOLINE HCL 0.05 % NA SOLN
NASAL | Status: DC | PRN
  Administered 2017-03-04 (×2): 1 via TOPICAL

## 2017-03-04 MED ORDER — ARFORMOTEROL TARTRATE 15 MCG/2ML IN NEBU
15.0000 ug | INHALATION_SOLUTION | Freq: Two times a day (BID) | RESPIRATORY_TRACT | Status: DC
  Administered 2017-03-04 – 2017-03-06 (×4): 15 ug via RESPIRATORY_TRACT
  Filled 2017-03-04 (×5): qty 2

## 2017-03-04 MED ORDER — FENTANYL CITRATE (PF) 100 MCG/2ML IJ SOLN
100.0000 ug | INTRAMUSCULAR | Status: DC | PRN
  Administered 2017-03-04 – 2017-03-06 (×8): 100 ug via INTRAVENOUS
  Filled 2017-03-04 (×8): qty 2

## 2017-03-04 MED ORDER — MUPIROCIN 2 % EX OINT
TOPICAL_OINTMENT | Freq: Two times a day (BID) | CUTANEOUS | Status: DC
  Filled 2017-03-04: qty 22

## 2017-03-04 MED ORDER — SERTRALINE HCL 50 MG PO TABS
50.0000 mg | ORAL_TABLET | Freq: Every day | ORAL | Status: DC
  Administered 2017-03-05 – 2017-03-06 (×2): 50 mg
  Filled 2017-03-04 (×2): qty 1

## 2017-03-04 MED ORDER — ORAL CARE MOUTH RINSE
15.0000 mL | Freq: Four times a day (QID) | OROMUCOSAL | Status: DC
  Administered 2017-03-05 – 2017-03-06 (×8): 15 mL via OROMUCOSAL

## 2017-03-04 MED ORDER — LIDOCAINE HCL (CARDIAC) 20 MG/ML IV SOLN
INTRAVENOUS | Status: DC | PRN
  Administered 2017-03-04: 40 mg via INTRAVENOUS

## 2017-03-04 SURGICAL SUPPLY — 29 items
BLADE SURG 15 STRL LF DISP TIS (BLADE) IMPLANT
BLADE SURG 15 STRL SS (BLADE)
CONT SPEC 4OZ CLIKSEAL STRL BL (MISCELLANEOUS) ×2 IMPLANT
COVER BACK TABLE 60X90IN (DRAPES) ×2 IMPLANT
COVER MAYO STAND STRL (DRAPES) ×2 IMPLANT
CRADLE DONUT ADULT HEAD (MISCELLANEOUS) IMPLANT
DRAPE HALF SHEET 40X57 (DRAPES) ×2 IMPLANT
GAUZE SPONGE 4X4 12PLY STRL (GAUZE/BANDAGES/DRESSINGS) ×2 IMPLANT
GAUZE SPONGE 4X4 16PLY XRAY LF (GAUZE/BANDAGES/DRESSINGS) IMPLANT
GLOVE ECLIPSE 8.0 STRL XLNG CF (GLOVE) ×2 IMPLANT
GUARD TEETH (MISCELLANEOUS) ×2 IMPLANT
KIT BASIN OR (CUSTOM PROCEDURE TRAY) ×2 IMPLANT
KIT ROOM TURNOVER OR (KITS) ×2 IMPLANT
MARKER SKIN DUAL TIP RULER LAB (MISCELLANEOUS) IMPLANT
NDL HYPO 25GX1X1/2 BEV (NEEDLE) IMPLANT
NEEDLE HYPO 25GX1X1/2 BEV (NEEDLE) IMPLANT
NS IRRIG 1000ML POUR BTL (IV SOLUTION) ×2 IMPLANT
PAD ARMBOARD 7.5X6 YLW CONV (MISCELLANEOUS) ×4 IMPLANT
PAD EYE OVAL STERILE LF (GAUZE/BANDAGES/DRESSINGS) IMPLANT
PATTIES SURGICAL .5 X3 (DISPOSABLE) ×1 IMPLANT
PATTIES SURGICAL 1X1 (DISPOSABLE) IMPLANT
SPECIMEN JAR SMALL (MISCELLANEOUS) ×2 IMPLANT
SPONGE INTESTINAL PEANUT (DISPOSABLE) IMPLANT
SPONGE NEURO XRAY DETECT 1X3 (DISPOSABLE) ×1 IMPLANT
SURGILUBE 2OZ TUBE FLIPTOP (MISCELLANEOUS) ×2 IMPLANT
TOWEL OR 17X24 6PK STRL BLUE (TOWEL DISPOSABLE) ×4 IMPLANT
TRAP SPECIMEN MUCOUS 40CC (MISCELLANEOUS) IMPLANT
TUBE CONNECTING 12X1/4 (SUCTIONS) ×2 IMPLANT
WATER STERILE IRR 1000ML POUR (IV SOLUTION) ×2 IMPLANT

## 2017-03-04 NOTE — H&P (Signed)
PULMONARY / CRITICAL CARE MEDICINE   Name: [name] MRN: [id number] DOB: [birth year]    ADMISSION DATE:  03/04/2017 CONSULTATION DATE:  03/04/2017  REFERRING MD:  Dr. Lazarus Salines  CHIEF COMPLAINT:  Ventilator management  HISTORY OF PRESENT ILLNESS:   46 year old female with PMH as below, which is significant for Asthma, DM, Depression, and HTN. She was admitted initially on 5/30 to [facility] for asthma exacerbation requiring intubation. She is smoker who has been hospitalized in the past for this, but had never been intubated before. While intubated she required neuromuscular blockade in order to safely ventilate. She was able to be extubated 6/5. She was treated with azithromycin for sputum culture which grew moraxella, RVP also positive for rhinovirus. She also required CRRT for a short period while in ICU. Once air trapping and vent mechanic improved, her renal function did as well. She was able to transfer to medical floor, but remained weak, which was thought to be secondary to ICU aqcuired weakness.  She was later transferred to [facility] on 6/8 for further rehabilitation efforts.  On 6/12, ENT was consulted because patient reportedly coughed up to 3 pieces of foreign material which were concerning to be part of the ET tube. Pathology thought that it was some sort of a nonspecific plastic material. She also had mild hemoptysis. ENT felt that the best plan of action would be to proceed with laryngoscopy, bronchoscopy, esophagoscopy under general anesthesia.  On 6/13, she was taking the OR for above procedures. She was found to have intubation injury on the posterior vocal cords and posterior commissure along with subglottic granulation tissue. There was also a curvilinear foreign body noted that was possibly consistent with necrotic tracheal ring cartilage. ENT was not able to establish an adequate airway; therefore, she was left intubated.  Plans were to transfer her to [facility] for further evaluation.  While awaiting transfer, PCCM was asked to assist with ventilator management.   PAST MEDICAL HISTORY :   has a past medical history of Arthritis; Asthma; Chronic back pain; Chronic chest pain; Depression; DM (diabetes mellitus) (HCC); GERD (gastroesophageal reflux disease); Headache(784.0); Hypertension; and Hypokalemia.  has a past surgical history that includes Cesarean section; Appendectomy; Vesicovaginal fistula closure w/ TAH (2009); Hernia repair; Cholecystectomy (N/A, 03/02/2014); and Rotator cuff repair.        Prior to Admission medications   Medication Sig Start Date End Date Taking? Authorizing Provider  acetaminophen-codeine (TYLENOL #3) 300-30 MG tablet Take 1 tablet by mouth every 12 (twelve) hours as needed for moderate pain. 09/08/16   Jaclyn Shaggy, MD  albuterol (PROVENTIL HFA;VENTOLIN HFA) 108 (90 Base) MCG/ACT inhaler Inhale 2 puffs into the lungs every 6 (six) hours as needed for wheezing or shortness of breath. Reported on 02/22/2016 09/08/16   Jaclyn Shaggy, MD  albuterol (PROVENTIL) (2.5 MG/3ML) 0.083% nebulizer solution Take 3 mLs (2.5 mg total) by nebulization every 6 (six) hours as needed for wheezing or shortness of breath. Reported on 02/22/2016 09/08/16   Jaclyn Shaggy, MD  amLODipine (NORVASC) 5 MG tablet Take 1 tablet (5 mg total) by mouth daily. 01/05/17   Loletta Specter, PA-C  arformoterol [facility]) 15 MCG/2ML NEBU Take 2 mLs (15 mcg total) by nebulization 2 (two) times daily. 02/27/17   Diallo, Lilia Argue, MD  aspirin EC 325 MG tablet Take 325 mg by mouth daily.    [provider]  Blood Glucose Monitoring Suppl (ACCU-CHEK AVIVA PLUS) w/Device KIT Use to check blood  sugar 3 times per day. DX CODE: E11.9 Patient not taking: Reported on 02/19/2017 05/21/16   Renato Shin, MD  bromocriptine (PARLODEL) 2.5 MG tablet 1/4 tab daily Patient taking differently: Take 0.625 mg by mouth daily.  08/06/16    Renato Shin, MD  budesonide (PULMICORT) 0.5 MG/2ML nebulizer solution Take 2 mLs (0.5 mg total) by nebulization 2 (two) times daily. 02/27/17   Diallo, Earna Coder, MD  dapagliflozin propanediol (FARXIGA) 10 MG TABS tablet Take 10 mg by mouth daily. 01/25/16   Renato Shin, MD  Dulaglutide (TRULICITY) 1.5 XM/4.6OE SOPN Inject 1.5 mg into the skin once a week. 01/26/17   Renato Shin, MD  gabapentin (NEURONTIN) 300 MG capsule Take 1 capsule (300 mg total) by mouth 3 (three) times daily. 09/08/16   Arnoldo Morale, MD  glucose blood (ACCU-CHEK AVIVA PLUS) test strip Use to check blood sugar 3 times per day.DX Code E11.9 Patient not taking: Reported on 02/19/2017 01/26/17   Renato Shin, MD  Insulin Pen Needle 32G X 4 MM MISC Use to inject insulin 3 times per day. Patient not taking: Reported on 02/19/2017 10/29/15   Renato Shin, MD  Lancet Devices [facility]) lancets Use to check blood sugar 3 times per day dx code E11.9 Patient not taking: Reported on 02/19/2017 09/18/15   Elayne Snare, MD  Lancets (ACCU-CHEK MULTICLIX) lancets Use to check blood sugar 3 times per day. DX code E11.9 Patient not taking: Reported on 02/19/2017 08/24/15   Renato Shin, MD  methocarbamol (ROBAXIN) 500 MG tablet Take 1 tablet (500 mg total) by mouth every 8 (eight) hours as needed for muscle spasms. 09/08/16   Arnoldo Morale, MD  olopatadine (PATANOL) 0.1 % ophthalmic solution Place 1 drop into the right eye 2 (two) times daily. 09/29/16   Arnoldo Morale, MD  oxyCODONE-acetaminophen (PERCOCET) 5-325 MG tablet Take 1-2 tablets by mouth every 4 (four) hours as needed. Patient taking differently: Take 1-2 tablets by mouth every 4 (four) hours as needed for moderate pain.  06/23/16   Harris, Abigail, PA-C  pantoprazole (PROTONIX) 40 MG tablet Take 1 tablet (40 mg total) by mouth daily. 09/08/16   Arnoldo Morale, MD  predniSONE (DELTASONE) 50 MG tablet Patient will take for 3 more days 50 mg a day 02/28/17    Diallo, Earna Coder, MD  prochlorperazine (COMPAZINE) 10 MG tablet Take 1 tablet (10 mg total) by mouth 2 (two) times daily as needed for nausea or vomiting. 08/21/16   Carlisle Cater, PA-C  repaglinide (PRANDIN) 2 MG tablet 1 tab with breakfast and lunch, and 2 tabs with dinner. Patient taking differently: Take 2-4 mg by mouth See admin instructions. Take 2 mg by mouth with breakfast and lunch, and 4 mg by mouth with dinner. 05/27/16   Renato Shin, MD  sertraline (ZOLOFT) 50 MG tablet Take 1 tablet (50 mg total) by mouth daily. 09/08/16   Arnoldo Morale, MD  simvastatin (ZOCOR) 10 MG tablet Take 1 tablet (10 mg total) by mouth daily at 6 PM. 09/08/16   Arnoldo Morale, MD  SUMAtriptan (IMITREX) 100 MG tablet TAKE 1TAB AT EARLIEST ONSET OF HEADACHE. MAY REPEAT X1 IN 2 HOURS IF HEADACHE PERSISTS OR RECURS. DO NOT EXCEED 2 TABS IN 24HRS 09/08/16   Arnoldo Morale, MD        Allergies  Allergen Reactions  . Reglan [Metoclopramide] Other (See Comments)    Panic attack  . Topamax [Topiramate]     Per patient, face swells and fingers tingles    FAMILY HISTORY:  family history includes Arthritis in her mother; Diabetes in her brother, mother, and sister; Heart attack in her mother; Hypertension in her mother and sister; Seizures in her brother; Stroke in her father and mother.  SOCIAL HISTORY:  reports that she has been smoking Cigarettes.  She has a 5.50 pack-year smoking history. She has never used smokeless tobacco. She reports that she drinks about 0.6 - 1.2 oz of alcohol per week . She reports that she does not use drugs.  REVIEW OF SYSTEMS:   Unable to obtain as pt is encephalopathic.  SUBJECTIVE:  On vent, appears uncomfortable.  VITAL SIGNS: Temp:  [98.5 F (36.9 C)-98.8 F (37.1 C)] 98.5 F (36.9 C) (06/13 0924) Pulse Rate:  [88-99] 99 (06/13 1337) Resp:  [18-20] 18 (06/13 1337) BP: (140-162)/(86-94) 140/86 (06/13 1337) SpO2:  [97 %-100 %] 100 % (06/13 1337) FiO2  (%):  [60 %] 60 % (06/13 1337) Weight:  [105.7 kg (233 lb)] 105.7 kg (233 lb) (06/13 0454)  PHYSICAL EXAMINATION: General:  Obese female, on vent. Neuro:  Sedated, appears uncomfortable. HEENT:  [place]/AT, PERRL, no JVD. Cardiovascular:  RRR, no MRG. Lungs:  Respirations even and unlabored.  Clear bilaterally. Abdomen:  Soft, non-tender, non-distended. Musculoskeletal:  No acute deformity, no edema. Skin: Grossly intact.   LABS:  BMET  Recent Labs Lab 03/01/17 0456 03/02/17 0515 03/03/17 0507  NA 136 136 139  K 3.4* 3.4* 3.8  CL 104 105 105  CO2 _0 BUN 20 22* 18  CREATININE 1.05* 1.09* 1.06*  GLUCOSE 88 117* 112*    Electrolytes  Recent Labs Lab 02/26/17 0450 02/27/17 0452  03/01/17 0456 03/02/17 0515 03/03/17 0507  CALCIUM 8.8* 8.7*  < > 8.7* 8.5* 8.9  PHOS 4.9* 5.5*  --   --   --   --   < > = values in this interval not displayed.  CBC  Recent Labs Lab 02/28/17 0427 03/02/17 0515 03/03/17 0507  WBC 17.5* 19.1* 17.7*  HGB 10.5* 10.2* 10.9*  HCT 32.6* 31.5* 33.6*  PLT 345 354 381    Coag's No results for input(s): APTT, INR in the last 168 hours.  Sepsis Markers No results for input(s): LATICACIDVEN, PROCALCITON, O2SATVEN in the last 168 hours.  ABG No results for input(s): PHART, PCO2ART, PO2ART in the last 168 hours.  Liver Enzymes  Recent Labs Lab 02/26/17 0450 02/27/17 0452 02/28/17 0427  AST  --   --  17  ALT  --   --  26  ALKPHOS  --   --  66  BILITOT  --   --  0.4  ALBUMIN 2.6* 2.4* 2.4*    Cardiac Enzymes No results for input(s): TROPONINI, PROBNP in the last 168 hours.  Glucose  Recent Labs Lab 03/03/17 0608 03/03/17 1215 03/03/17 1650 03/03/17 2148 03/04/17 0643 03/04/17 0920  GLUCAP 112* 146* 76 143* 124* 137*    Imaging No results found.  STUDIES:  TTE 6/1 > LVEF 65% mild LVH, RV normal SLP 6/6 > mild aspiration risk, recs for regular thin liquid  CULTURES: 5/31 RVP  > +  rhinovirus/enterovirus 5/31 BCx 2 > negative 6/3 blood culture > neg 6/3 resp culture > moraxella  ANTIBIOTICS: Rocephin 5/31x1 Azithromycin 551m IV daily > 6/09  SIGNIFICANT EVENTS  5/30 admit asthma > intubate 5/31-6/3 high FIO2/PEEP, Neuromuscular blocked 6/5 extubated 6/8 discharge to CIR 6/13 OR w/ENT  LINES/TUBES: LINES/TUBES: ETT 5/30 > 6/5; 6/13 >> OGT 5/30 > 6/5 Foley 5/30 > R  IJ HD 5/31 > 6/4 PIV   ASSESSMENT / PLAN:  PULMONARY A: Intubated for airway protection Glottic and severe obstructive subglottic injury - intubation vs +/- FBO aspiration Asthma Tobacco Abuse Moraxella HCAP- completed zithromax 6/09 P:   PRVC 8 cc/kg Wean FiO2/ PEEP for sats > 92%  No extubation at this point CXR now  ABG now Start brovanna and budesonide  Albuterol prn  Pending transfer to [facility] per ENT   Await CT of neck and chest   CARDIOVASCULAR A:  Hx HTN, HLD P:  ICU monitoring  Hold norvasc and losartan Goal MAP > 65  RENAL A:   No acute issues  P:   BMP/ Mag/ Phos now  Trend urinary output Replace electrolytes as indicated Avoid nephrotoxic agents, ensure adequate renal perfusion   GASTROINTESTINAL A:   Hx GERD P:   Protonix for SUP NPO  HEMATOLOGIC A:   Anemia - stable P:  CBC now and trend SCDs    INFECTIOUS A:   No acute issues  P:   Trend WBC Monitor fever curve  ENDOCRINE A:   DM P:   SSI CBG q 4hr   NEUROLOGIC A:   Hx depression, chronic back pain, migraines, neuropathy P:   RASS goal: 0/-1 Fentanyl prn Propofol gtt Holding home zoloft, hydrocodone, neurotin   FAMILY  - Updates: No family at bedside 6/13  - Inter-disciplinary family meet or Palliative Care meeting due by:  03/11/2017   CCT 40 mins  [name], AGACNP-BC [facility] Pgr: [phone] or if no answer [phone] 03/04/2017, 2:26 PM   Attending Note:  I have examined patient, reviewed labs, studies and notes. I have  discussed the case with B Simpson, and I agree with the data and plans as amended above. 46 year old woman with history of chronic upper airway irritation syndrome, allergic rhinitis. She cares a diagnosis of asthma but had normal spirometry without a bronchodilator response in 2012. I have followed her in the office and she is been treated with bronchodilators based on the clinical response. She was admitted with respiratory failure in the setting of rhinovirus and a Moraxella bronchitis and intubated 02/18/17. She was extubated 6/5. There is some question as to whether she may have aspirated foreign body because she was apparently able to cough up some particulate foreign material. This prompted an ENT evaluation and surgical inspection of her airway. This was done by Dr. Erik Obey on 5/36 and showed a significant posterior vocal cord injury post intubation with posterior commissure and subglottic granulation tissue. Also noted was some curvilinear material similar to that which she had been able to cough up. Question whether this was foreign body versus tracheal ring cartilage - Dr Erik Obey felt that it was the latter. There was consideration of tracheostomy placement today but this was deferred given the extent of the granulation tissue. She was left intubated and transferred to the ICU for further management. PCCM consulted to assist with medical and ventilator management. On my eval she is sedated, comfortable on MV. Her ETT is low on her CXR and we will pull it back 3-4 cm. lungs are clear. No wheezing. Dr. Erik Obey is planning to investigate transfer to [facility] for further evaluation and plan definitive treatment and probably tracheostomy placement. We will restart her bronchodilators, continue sedation. Restart her home zoloft.   Independent critical care time is 45 minutes.   Baltazar Apo, MD, PhD 03/04/2017, 3:56 PM [facility] [phone] or if  no answer [phone]

## 2017-03-04 NOTE — Progress Notes (Signed)
[facility] Progress Note Patient Name: [name] DOB: 1971-09-07 MRN: [id number]   Date of Service  03/04/2017/  HPI/Events of Note  Hypotension - BP = 80/40.  eICU Interventions  Will bolus with 0.9 NaCl 1 liter IV over 1 hour now.     Intervention Category Major Interventions: Hypotension - evaluation and management  Lyall Faciane Eugene 03/04/2017, 6:22 PM

## 2017-03-04 NOTE — Transfer of Care (Signed)
Immediate Anesthesia Transfer of Care Note  Patient: [name]  Procedure(s) Performed: Procedure(s): PANENDOSCOPY WITH POSSIBLE FOREIGN BODY REMOVAL (N/A)  Patient Location: ICU  Anesthesia Type:General  Level of Consciousness: sedated, unresponsive and Patient remains intubated per anesthesia plan  Airway & Oxygen Therapy: Patient remains intubated per anesthesia plan and Patient placed on Ventilator (see vital sign flow sheet for setting)  Post-op Assessment: Report given to RN and Post -op Vital signs reviewed and stable  Post vital signs: Reviewed and stable  Last Vitals:  Vitals:   03/04/17 0454 03/04/17 0924  BP: (!) 161/94 (!) 145/86  Pulse: 95 97  Resp: 18 20  Temp: 37.1 C 36.9 C    Last Pain:  Vitals:   03/04/17 0924  TempSrc: Oral  PainSc:       Patients Stated Pain Goal: 2 (79/48/01 6553)  Complications: No apparent anesthesia complications

## 2017-03-04 NOTE — Progress Notes (Signed)
Speech Language Pathology Discharge Summary  Patient Details  Name: [name] MRN: [id number] Date of Birth: 03/22/1971     Patient has met 4 of 4 long term goals.  Patient to discharge at overall Modified Independent level.  Reasons goals not met:     Clinical Impression/Discharge Summary:  Pt has met 4 out of 4 long term goals and is discharging at an overall mod I level for swallowing and cognition.  Pt is back to baseline for cognition per reports from pt and her husband.  While pt was not observed with POs during treatment session due to nausea and vomiting, pt reports overall good toleration of regular textures and thin liquids.  As a result no further ST needs are indicated at this time.    Care Partner:  Caregiver Able to Provide Assistance:  (n/a)  Type of Caregiver Assistance:  (n/a)  Recommendation:  None      Equipment: none recommended by SLP    Reasons for discharge: Discharged from hospital   Patient/Family Agrees with Progress Made and Goals Achieved: Yes   Harun Brumley, Selinda Orion 03/04/2017, 11:50 AM

## 2017-03-04 NOTE — Anesthesia Procedure Notes (Signed)
Procedure Name: Intubation Date/Time: 03/04/2017 11:34 AM Performed by: Rush Farmer E Pre-anesthesia Checklist: Patient identified, Emergency Drugs available, Suction available and Patient being monitored Patient Re-evaluated:Patient Re-evaluated prior to inductionOxygen Delivery Method: Circle system utilized Preoxygenation: Pre-oxygenation with 100% oxygen Intubation Type: IV induction Ventilation: Mask ventilation without difficulty and Oral airway inserted - appropriate to patient size Laryngoscope size: Rigid Bronchoscope per Dr. Erik Obey. Tube type: Oral Tube size: 6.0 mm Number of attempts: 2 Airway Equipment and Method: Stylet Placement Confirmation: ETT inserted through vocal cords under direct vision,  positive ETCO2 and breath sounds checked- equal and bilateral Secured at: 23 cm Tube secured with: Tape Dental Injury: Teeth and Oropharynx as per pre-operative assessment  Comments: DL per Dr. Erik Obey using rigid bronchoscope. 6.0 ETT inserted per Dr. Erik Obey.

## 2017-03-04 NOTE — Op Note (Signed)
03/04/2017  1:15 PM    [name], Bertram Millard  [id number]   Pre-Op Dx:  Glottic Intubation injury  Post-op Dx: glottic and severe obstructive subglottic injury  Proc: direct laryngoscopy, bronchoscopy with biopsy, esophagoscopy  Surg:  Jodi Marble T MD  Anes:  General mask converted to GOT  EBL:  50 ml  Comp:  none  Findings:  Short thick neck.  Obesity.  Short jaw.  Floppy epiglottis.  Posterior vocal cord bilateral and posterior comissure eschar c/w intubation injury. Subglottic trachea essentially completely full of what appears to be chronic granulation tissue for a distance of 3-4 centimeters.  A curvilinear foreign body possibly consistent with necrotic tracheal ring cartilage. I could not establish an adequate airway and she was left intubated.  Proc:  Direct Laryngoscopy with Biopsy,  Esophagoscopy,  Bronchoscopy  Procedure: With the patient in a comfortable supine position, IV and mask anesthesia was induced without difficulty.  At an appropriate level, the table was turned 90 degrees away from Anesthesia.  A clean preparation and draping was performed in the standard fashion.  A surgical time out was obtained in the standard fashion.  A rubber tooth guard was placed.     Using the [facility] laryngoscope, attempted laryngeal visualization was performed. She had a floppy epiglottis which was posterior lying and I could not get under this to the glottis. I remove the Glennon Mac scope and placed a Hollinger anterior commissure laryngoscope. The findings were as described above.  The rod bronchoscope was passed through the laryngoscope and the findings were as described above. I was able to pass the bronchoscope through the granulation tissue but could not visualize on account of blood contamination. Going back and forth from apneic inspection to mask ventilation, attempts were made to remove granulation tissue in the subglottic area with minimal progress towards establishing  an airway. The Hollinger laryngoscope was removed  and the Jackson laryngoscope was reintroduced. The patient was intubated with a 6 ETT. Ventilation was assumed per endotracheal tube.The  Jako anterior commissure laryngoscope was introduced, placed in the supraglottis, and suspended in the standard fashion. Using bronchoscopic visualization, straight and angled cup forceps, and Afrin pledgets for hemostasis, multiple samples of apparent chronic granulation tissue were removed and sent for pathologic interpretation. A 18 mm curvilinear brown flexible piece of material similar to what the patient had been coughing up yesterday and the day before was delivered and also sent for pathologic interpretation. After removing as much granulation tissue as possible, the patient was partially extubated under direct vision. The airway was not felt to be adequate. Palpation with the biopsy forceps revealed a soft area on the anterior tracheal wall which was pulsatile consistent with the innominate artery. On external palpation this was felt to be in the region of a possible tracheostomy site. I elected to keep the patient intubated rather than attempt tracheostomy. The 6.0 ET tube was pushed back in and ventilation was continued. Using the [place] scope once again, under direct vision, the 6.0 tube was removed and a 7.5 ET tube was placed without difficulty.  Anesthesia was continued per ETT.  The cervical esophagoscope was lubricated and inserted into the hypopharynx.  With gentle pressure, it was passed through the cricopharyngeus and advanced to its full length without difficulty with the findings as described above.  It was removed.  The oropharynx, oral cavity, nasopharynx and hypopharynx were palpated with no other significant findings.   The neck was palpated on both sides with no significant findings.  The patient was still in the OR, and  anesthesia requested that I exchanged the 7.5 for a long-term 7.0 ETT  which was performed in the same fashion.    At this point the procedure was completed. The tooth guard was removed.   Dental status was intact.  The patient was returned to Anesthesiaawakened, and transferred to 2100 medical intensive care unit intubated.   Dispo:   OR to 2100  Plan:  Antireflux measures. Critical care medicine assistance. She will need to go to [facility] for assessment for subglottic stenosis and chronic granulation. We will await the pathology reports. She was supposed to have a CT scan of the neck and chest yesterday which did not occur. I will reorder this today.   Tyson Alias MD

## 2017-03-04 NOTE — Progress Notes (Signed)
[facility]     PROGRESS NOTE  Subjective/Complaints:  Pt seen sitting up in bed this AM.  She slept well overnight after falling asleep.  Seen by ENT yesterday, plan for scope today.    ROS:  +Cough, SOB. Denies CP, N/V/D.  Objective: Vital Signs: Blood pressure (!) 161/94, pulse 95, temperature 98.8 F (37.1 C), temperature source Oral, resp. rate 18, height 5\' 1"  (1.549 m), weight 105.7 kg (233 lb), SpO2 97 %. No results found.  Recent Labs  03/02/17 0515 03/03/17 0507  WBC 19.1* 17.7*  HGB 10.2* 10.9*  HCT 31.5* 33.6*  PLT 354 381    Recent Labs  03/02/17 0515 03/03/17 0507  NA 136 139  K 3.4* 3.8  CL 105 105  GLUCOSE 117* 112*  BUN 22* 18  CREATININE 1.09* 1.06*  CALCIUM 8.5* 8.9   CBG (last 3)   Recent Labs  03/03/17 1650 03/03/17 2148 03/04/17 0643  GLUCAP 76 143* 124*    Wt Readings from Last 3 Encounters:  03/04/17 105.7 kg (233 lb)  02/27/17 105.7 kg (233 lb 0.4 oz)  01/05/17 108.7 kg (239 lb 9.6 oz)    Physical Exam:  BP (!) 161/94 (BP Location: Right Arm)   Pulse 95   Temp 98.8 F (37.1 C) (Oral)   Resp 18   Ht 5\' 1"  (1.549 m)   Wt 105.7 kg (233 lb)   SpO2 97%   BMI 44.02 kg/m  Constitutional: She appears well-developed. Obese. NAD. HENT: Normocephalic and atraumatic.  Eyes: EOMI. No discharge.  Cardiovascular: RRR. No JVD. Respiratory: SOB. No stridor, wheezes.  GI: Soft. Bowel sounds are normal.  Musculoskeletal: She exhibits edema and tenderness.  Neurological: She is alert and oriented.  Motor: 4+/5 throughout  Skin: Skin is warm and dry.  Psychiatric: Her speech is normal. Her mood appears anxious. She is agitated. Cognition and memory are not impaired.   Assessment/Plan: 1. Functional deficits secondary to debility which require 3+ hours per day of interdisciplinary therapy in a comprehensive inpatient rehab setting. Physiatrist is providing close team supervision and 24 hour management of  active medical problems listed below. Physiatrist and rehab team continue to assess barriers to discharge/monitor patient progress toward functional and medical goals.  Function:  Bathing Bathing position   Position: Shower  Bathing parts Body parts bathed by patient: Right arm, Left arm, Chest, Abdomen, Front perineal area, Buttocks, Right upper leg, Left upper leg, Right lower leg, Left lower leg Body parts bathed by helper: Left lower leg, Back, Right lower leg  Bathing assist Assist Level: Assistive device Assistive Device Comment: seated on shower chair     Upper Body Dressing/Undressing Upper body dressing   What is the patient wearing?: Bra, Pull over shirt/dress Bra - Perfomed by patient: Thread/unthread right bra strap, Thread/unthread left bra strap, Hook/unhook bra (pull down sports bra) Bra - Perfomed by helper: Hook/unhook bra (pull down sports bra) Pull over shirt/dress - Perfomed by patient: Thread/unthread right sleeve, Thread/unthread left sleeve, Put head through opening, Pull shirt over trunk Pull over shirt/dress - Perfomed by helper: Put head through opening        Upper body assist Assist Level: No help, No cues      Lower Body Dressing/Undressing Lower body dressing   What is the patient wearing?: Underwear, Non-skid slipper socks, Pants Underwear - Performed by patient: Thread/unthread right underwear leg, Thread/unthread left underwear leg, Pull underwear up/down   Pants- Performed by patient: Pull pants  up/down, Thread/unthread right pants leg, Thread/unthread left pants leg   Non-skid slipper socks- Performed by patient: Don/doff right sock, Don/doff left sock                    Lower body assist Assist for lower body dressing: No Help, No cues      Toileting Toileting   Toileting steps completed by patient: Adjust clothing prior to toileting, Performs perineal hygiene, Adjust clothing after toileting Toileting steps completed by helper:  Adjust clothing prior to toileting, Adjust clothing after toileting Toileting Assistive Devices: Grab bar or rail  Toileting assist Assist level: More than reasonable time   Transfers Chair/bed transfer   Chair/bed transfer method: Ambulatory Chair/bed transfer assist level: No Help, no cues, assistive device, takes more than a reasonable amount of time Chair/bed transfer assistive device: Armrests     Locomotion Ambulation     Max distance: 220 ft Assist level: No help, No cues, assistive device, takes more than a reasonable amount of time   Wheelchair Wheelchair activity did not occur: Safety/medical concerns        Cognition Comprehension Comprehension assist level: Follows complex conversation/direction with extra time/assistive device  Expression Expression assist level: Expresses complex ideas: With extra time/assistive device  Social Interaction Social Interaction assist level: Interacts appropriately with others with medication or extra time (anti-anxiety, antidepressant).  Problem Solving Problem solving assist level: Solves complex 90% of the time/cues < 10% of the time  Memory Memory assist level: More than reasonable amount of time      Medical Problem List and Plan:  1. Functional and mobility deficits secondary to debility   D/ced from therapies, d/c home after medically stable 2. DVT Prophylaxis/Anticoagulation: Pharmaceutical: Lovenox  3. Chronic back pain with radiculopathy/Pain Management: IV fentanyl discontinued.   Hydrocodone---pt took this at home q6 prn  4. Mood: team to provide ego support. Continue zoloft. LCSW to follow for evaluation and support.   Anxiolytic started for anxiety 5. Neuropsych: This patient is capable of making decisions on her own behalf.  6. Skin/Wound Care: Routine pressure relief measures.  7. Fluids/Electrolytes/Nutrition: Monitor I/Os.  8. Asthma exacerbation: Completed steroids. Continue brovana and duonebs. Encourage  aggressive pulmonary hygiene.   Seen by Pulm, appreciate recs -  Suggesting CT neck and chest, not performed yet 9. Moraxella HCAP: completed Zithromax 6/9.  10. AKI on ?CKD: Avoid nephrotoxic drugs  Cr 1.06 on 6/12  Cont to monitor 11.HTN: Monitor BP bid.  On Norvasc  Losartan 25 started on 6/11, increased to 50 6/13 (PTA 100)  12. T2DM: Monitor BS ac/hs. Resumed prandin and dulaglutide.   Meds adjusted, improving 13. Morbid obesity: diet education for weight loss and to promoted over all health.  14. Leucocytosis: Likely reactive due to infection and steroids. Continue to monitor.   WBCs 17.7 on 6/12  Afebrile  Cont to monitor  Will cont to monitor now that steroids completed 15. Severe Throat pain: Added Carafate ac/hs with MMW prn.  16. Migraines: has been refusing Topamax. Continue Imitrex prn.  17. Neuropathy: Increased gabapentin to tid to help manage dysesthesias RUE  18. Hypoalbuminemia  Supplement initiated 6/9 19. Hemoptysis  CXR reviewed, improved  Neck xray reviewed, unremarkable  PPI increased to BID  Throat spray/lozanges ordered  Spoke with GI, will consider Pulm eval if warranted  With foreign body expectorated, showing plastic  ENT consulted, plan for scope today for further eval 20. ABLA  Hb 10.9 on 6/12  Cont to monitor  LOS (Days) 5 A FACE TO FACE EVALUATION WAS PERFORMED  Ankit Lorie Phenix 03/04/2017 8:19 AM

## 2017-03-04 NOTE — Anesthesia Preprocedure Evaluation (Addendum)
Anesthesia Evaluation  Patient identified by MRN, date of birth, ID band Patient awake    Reviewed: Allergy & Precautions, NPO status , Patient's Chart, lab work & pertinent test results  History of Anesthesia Complications Negative for: history of anesthetic complications  Airway Mallampati: II  TM Distance: >3 FB Neck ROM: Full    Dental  (+) Dental Advisory Given   Pulmonary asthma , COPD,  COPD inhaler, Current Smoker,  Hoarse ever since emergent intubation for severe asthma attack 02/18/17   breath sounds clear to auscultation  + stridor     Cardiovascular hypertension, Pt. on medications (-) angina Rhythm:Regular Rate:Normal  02/22/17 ECHO: EF 65-70%, valves OK   Neuro/Psych negative neurological ROS     GI/Hepatic Neg liver ROS, GERD  Medicated and Controlled,  Endo/Other  diabetes (glu 137), Insulin DependentMorbid obesity  Renal/GU negative Renal ROS     Musculoskeletal   Abdominal (+) + obese,   Peds  Hematology negative hematology ROS (+)   Anesthesia Other Findings   Reproductive/Obstetrics                            Anesthesia Physical Anesthesia Plan  ASA: III  Anesthesia Plan: General   Post-op Pain Management:    Induction: Intravenous  PONV Risk Score and Plan: 2 and Ondansetron and Dexamethasone  Airway Management Planned: Oral ETT  Additional Equipment:   Intra-op Plan:   Post-operative Plan: Extubation in OR  Informed Consent: I have reviewed the patients History and Physical, chart, labs and discussed the procedure including the risks, benefits and alternatives for the proposed anesthesia with the patient or authorized representative who has indicated his/her understanding and acceptance.   Dental advisory given  Plan Discussed with: CRNA and Surgeon  Anesthesia Plan Comments: (Plan routine monitors, GETA  Dr Erik Obey to do intubation)         Anesthesia Quick Evaluation

## 2017-03-04 NOTE — Op Note (Deleted)
  The note originally documented on this encounter has been moved the the encounter in which it belongs.  

## 2017-03-04 NOTE — Progress Notes (Signed)
Pt husband took pt belongings home.

## 2017-03-04 NOTE — Progress Notes (Signed)
03/04/17 0800 nursing RN called MD on call re: meds to be given  before OR. Orders noted.

## 2017-03-04 NOTE — Progress Notes (Signed)
Received report from radiology that patient was unable to do CT scan because when she laid flat she could not breath. Patient confirmed report when she returned to unit.

## 2017-03-04 NOTE — Discharge Summary (Signed)
Physician Discharge Summary  Patient ID: [name] MRN: [id number] DOB/AGE: December 10, 1970 46 y.o.  Admit date: 02/27/2017 Discharge date: 03/04/2017  Discharge Diagnoses:  Principal Problem:   Tracheal stenosis Active Problems:   Critical illness polyneuropathy (HCC)   Critical illness myopathy   Hypoalbuminemia due to protein-calorie malnutrition (HCC)   Benign essential HTN   SOB (shortness of breath)   Hemoptysis   Acute blood loss anemia   Type 2 diabetes mellitus with peripheral neuropathy (HCC)   Generalized anxiety disorder   Discharged Condition: Serious   Significant Diagnostic Studies:  Dg Neck Soft Tissue  Result Date: 03/01/2017 CLINICAL DATA:  46 year old female with neck pain following recent intubation. EXAM: NECK SOFT TISSUES - 1+ VIEW COMPARISON:  None. FINDINGS: There is no evidence of retropharyngeal soft tissue swelling or epiglottic enlargement. The cervical airway is unremarkable and no radio-opaque foreign body identified. IMPRESSION: Negative. Electronically Signed   By: Margarette Canada M.D.   On: 03/01/2017 08:04    Portable Chest Xray  Result Date: 03/04/2017 CLINICAL DATA:  Status post intubation of the trachea and of the esophagus. EXAM: PORTABLE CHEST 1 VIEW COMPARISON:  Chest x-ray of March 01, 2017 FINDINGS: The endotracheal tube tip lies at or slightly above the carina. The esophagogastric tube tip in proximal port project below the GE junction and the inferior margin of the image. The lungs are hypoinflated. There is an air bronchogram in the left lower lobe. There is no pneumothorax nor large pleural effusion. IMPRESSION: Low positioning of the endotracheal tube. Withdrawal by 3 cm is recommended to avoid accidental right mainstem bronchus intubation with patient movement. The esophagogastric tube tip projects below the inferior margin of the image. Left lower lobe atelectasis or pneumonia. Electronically Signed   By: David  Martinique M.D.    On: 03/04/2017 14:09    Dg Chest Port 1 View  Result Date: 03/01/2017 CLINICAL DATA:  Hemoptysis. EXAM: PORTABLE CHEST 1 VIEW COMPARISON:  Multiple recent exams most recently 2 days prior 02/27/2017 FINDINGS: The cardiomediastinal contours are unchanged, decreased cardiomegaly. Improved lung aeration from prior. Pulmonary vasculature is normal. No consolidation, pleural effusion, or pneumothorax. No acute osseous abnormalities are seen. IMPRESSION: Improving aeration from prior exams.  No acute abnormality. Electronically Signed   By: Jeb Levering M.D.   On: 03/01/2017 02:06    Labs:  Basic Metabolic Panel:  Recent Labs Lab 02/26/17 0450 02/27/17 0452 02/28/17 0427 03/01/17 0456 03/02/17 0515 03/03/17 0507  NA 134* 135 135 136 136 139  K 4.2 3.6 3.8 3.4* 3.4* 3.8  CL 104 102 105 104 105 105  CO2 19* 23 21* 22 22 24   GLUCOSE 137* 184* 239* 88 117* 112*  BUN 34* 35* 25* 20 22* 18  CREATININE 1.17* 1.38* 1.17* 1.05* 1.09* 1.06*  CALCIUM 8.8* 8.7* 8.3* 8.7* 8.5* 8.9  PHOS 4.9* 5.5*  --   --   --   --     CBC:  Recent Labs Lab 02/28/17 0427 03/02/17 0515 03/03/17 0507  WBC 17.5* 19.1* 17.7*  NEUTROABS 10.4*  --   --   HGB 10.5* 10.2* 10.9*  HCT 32.6* 31.5* 33.6*  MCV 87.4 86.8 88.2  PLT 345 354 381    CBG:  Recent Labs Lab 03/03/17 1650 03/03/17 2148 03/04/17 0643 03/04/17 0920 03/04/17 1545  GLUCAP 76 143* 124* 137* 170*    Brief HPI:   [name] is a 46 y.o. female with history of asthma, tobacco use, T2DM, chronic pain, HA;  who was admitted on 02/18/17 with asthma exacerbation requiring intubation. History taken from chart review and multiple family members. She was treated with steroids and antibiotics with improvement and tolerated extubation on 6/5. She developed AKI requiring brief CRRT/HD thorough 6/4 and is showing improvement in SCr and UOP. CP/SOB felt to be due to demand ischemia and 2 D echo showed EF 65-70% with grade I diastolic  dysfunction. She developed fever on 6/3 and respiratory cultures positive for moraxella and sputum with rhinovirus. She was started on Zithromax for treatment of HCAP. Hyperglycemia being managed with SSI and BP medications being titrated upwards for tighter control. Therapy initiated and patient with significant deficits in mobility and ability to carry out ADL tasks due to critical illness polyneuropathy. CIR was recommended for follow up therapy.    Hospital Course: [name] was admitted to rehab 02/27/2017 for inpatient therapies to consist of PT, ST and OT at least three hours five days a week. Past admission physiatrist, therapy team and rehab RN have worked together to provide customized collaborative inpatient rehab. She completed zithromax for Moraxella HCAP on 6/9 and 3 additional day of steroids by 6/11. She reported severe throat pain question due to thrush and/or GERD from steroids was started on Carafate ac/hs and MMW prn.  Gabapentin was increased to tid to help manage dysesthesias and protein supplement was added due to hypoalbuminemia.  CBC at admission revealed continued leucocytosis likely due to steroids. Check of lytes showed renal status to be steadily improving and mild hypokalemia noted. Diabetes was monitored with ac/hs cbg checks and Prandin and dulaglutide were resumed to help manage hyperglycemia. Her blood sugars and blood pressures were noted to be elevated likely due to steroid effect. Meal coverage was increased with improvement in BS and Cozaar was resumed.    She did report intermittent SOB as well as  episode of hemoptysis on 6/10. CXR showed improvement in  aeration and neck films showed no soft tissue swelling or epiglottic enlargement. Protonix was increased to bid.  PCCM was consulted for input on SOB/ hemoptysis and recommended supplemental oxygen prn as well as current regimen of Brovana and Budesonide.  She has shown improvement in activity tolerance  and has tolerated increase in activity with rest breaks and complaints of back pain. She did report expectorating FB on 6/11 am with improvement in throat symptoms initially but later that night she coughed out FB X 2 with reports of chocking sensation. FB was sent to lab for evaluation with question of part of ET tube. This was dicussed with Dr. Ashok Cordia who recommended CT neck and Chest for work up.  Dr. Erik Obey ENT was consulted for input. Bronchoscopy showed ulcerations in posterior half of each vocal cord with post cricoid edema but not FB or bleeding noted therefore he recommended full exam under anesthesia.  She underwent direct laryngoscopy, bronchoscopy with biopsy, esophagoscopy on 6/13 which revealed subglottic trachea almost completely full of what appeared to be chronic granulation tissue.  Due to She was intubated for airway protection and admitted to ICU for vent support. She was discharged from rehab due to ventilator/ICU needs.    Rehab course: During patient's stay in rehab weekly team conferences were held to monitor patient's progress, set goals and discuss barriers to discharge. At admission, patient required mod assist with mobility and min assist with basic self care tasks. Cognitive evaluation revealed deficits in high level cognition with MoCA 25/30. Voice was intelligible but hoarse with reports of burning  but swallow function intact. She has had improvement in activity tolerance, balance, postural control, as well as ability to compensate for deficits.  She is able to complete ADL tasks at modified independent level. She is modified independent for transfers and is ambulating 200' X 2  without AD--oxygen saturation 96-98% post activity. Family education was completed with husband who will provide assistance as needed after discharge.     Disposition: ICU  Medications at discharge: 1. Brovan nebs 2. Albuterol every 2 hours prn 3. Lovenox 40  Mg SQ daily 4. Dulaglutide 1.5 mg SQ  weekly. 5. Prandin 2 mg with breakfast and lunch and 4 mg with supper.  6. Gabapentin 300 mg po tid. 7. Novolog 10 units po tid ac 8. Protonix 40 mg po daily 9. Carafate 1 g po ac/hs 10 Zocor 10 mg po q pm 11. Zoloft 50 mg po daily. 12. Cozaar 25 mg po daily.  Diet: NPO   Signed: Bary Leriche 03/04/2017, 4:13 PM

## 2017-03-04 NOTE — Anesthesia Procedure Notes (Signed)
Procedure Name: Intubation Date/Time: 03/04/2017 1:02 PM Performed by: Kyung Rudd Pre-anesthesia Checklist: Patient identified, Emergency Drugs available, Suction available and Patient being monitored Patient Re-evaluated:Patient Re-evaluated prior to inductionOxygen Delivery Method: Circle system utilized Preoxygenation: Pre-oxygenation with 100% oxygen Intubation Type: Inhalational induction with existing ETT Tube size: 7.0 mm Placement Confirmation: ETT inserted through vocal cords under direct vision,  positive ETCO2 and breath sounds checked- equal and bilateral Secured at: 23 cm Tube secured with: Tape Dental Injury: Teeth and Oropharynx as per pre-operative assessment

## 2017-03-04 NOTE — Progress Notes (Signed)
RT note: Pulled et tube 3cm per Marni Griffon NP to 22cm at lip.

## 2017-03-04 NOTE — Plan of Care (Signed)
Problem: RH Swallowing Goal: LTG Patient will consume least restrictive PO diet (SLP) LTG:  Patient will consume least restrictive PO diet with assist for use of compensatory strategies  (SLP)  Outcome: Completed/Met Date Met: 03/04/17 Per pt report, no POs observed due to nausea and vomiting during therapy session

## 2017-03-05 ENCOUNTER — Encounter (HOSPITAL_COMMUNITY): Payer: Self-pay | Admitting: Otolaryngology

## 2017-03-05 ENCOUNTER — Inpatient Hospital Stay (HOSPITAL_COMMUNITY): Payer: Medicare Other

## 2017-03-05 DIAGNOSIS — J4552 Severe persistent asthma with status asthmaticus: Secondary | ICD-10-CM

## 2017-03-05 LAB — BLOOD GAS, ARTERIAL
ACID-BASE EXCESS: 1.1 mmol/L (ref 0.0–2.0)
BICARBONATE: 25.3 mmol/L (ref 20.0–28.0)
Drawn by: 257881
FIO2: 50
MECHVT: 380 mL
O2 Saturation: 98.3 %
PATIENT TEMPERATURE: 98.6
PEEP/CPAP: 5 cmH2O
PO2 ART: 122 mmHg — AB (ref 83.0–108.0)
RATE: 18 resp/min
pCO2 arterial: 41 mmHg (ref 32.0–48.0)
pH, Arterial: 7.407 (ref 7.350–7.450)

## 2017-03-05 LAB — GLUCOSE, CAPILLARY
GLUCOSE-CAPILLARY: 101 mg/dL — AB (ref 65–99)
GLUCOSE-CAPILLARY: 109 mg/dL — AB (ref 65–99)
GLUCOSE-CAPILLARY: 141 mg/dL — AB (ref 65–99)
GLUCOSE-CAPILLARY: 158 mg/dL — AB (ref 65–99)
GLUCOSE-CAPILLARY: 89 mg/dL (ref 65–99)
GLUCOSE-CAPILLARY: 98 mg/dL (ref 65–99)
Glucose-Capillary: 97 mg/dL (ref 65–99)

## 2017-03-05 LAB — CBC
HEMATOCRIT: 31.6 % — AB (ref 36.0–46.0)
HEMOGLOBIN: 10.1 g/dL — AB (ref 12.0–15.0)
MCH: 28.1 pg (ref 26.0–34.0)
MCHC: 32 g/dL (ref 30.0–36.0)
MCV: 88 fL (ref 78.0–100.0)
Platelets: 377 10*3/uL (ref 150–400)
RBC: 3.59 MIL/uL — AB (ref 3.87–5.11)
RDW: 14.1 % (ref 11.5–15.5)
WBC: 18.8 10*3/uL — ABNORMAL HIGH (ref 4.0–10.5)

## 2017-03-05 LAB — CULTURE, RESPIRATORY: CULTURE: NORMAL

## 2017-03-05 LAB — BASIC METABOLIC PANEL
ANION GAP: 10 (ref 5–15)
BUN: 14 mg/dL (ref 6–20)
CALCIUM: 8.7 mg/dL — AB (ref 8.9–10.3)
CO2: 26 mmol/L (ref 22–32)
Chloride: 106 mmol/L (ref 101–111)
Creatinine, Ser: 1.16 mg/dL — ABNORMAL HIGH (ref 0.44–1.00)
GFR, EST NON AFRICAN AMERICAN: 55 mL/min — AB (ref >60)
GLUCOSE: 148 mg/dL — AB (ref 65–99)
POTASSIUM: 4.4 mmol/L (ref 3.5–5.1)
Sodium: 142 mmol/L (ref 135–145)

## 2017-03-05 LAB — CULTURE, RESPIRATORY W GRAM STAIN

## 2017-03-05 MED ORDER — FENTANYL CITRATE (PF) 100 MCG/2ML IJ SOLN: 25.0000 ug | INTRAMUSCULAR | 0 refills | Status: DC | PRN

## 2017-03-05 MED ORDER — ORAL CARE MOUTH RINSE: 15.0000 mL | Freq: Four times a day (QID) | OROMUCOSAL | 0 refills | Status: DC

## 2017-03-05 MED ORDER — PROPOFOL 1000 MG/100ML IV EMUL: 0.0000 ug/kg/min | INTRAVENOUS | Status: DC

## 2017-03-05 MED ORDER — ENOXAPARIN SODIUM 40 MG/0.4ML ~~LOC~~ SOLN: 40.0000 mg | SUBCUTANEOUS | Status: DC

## 2017-03-05 MED ORDER — FENTANYL CITRATE (PF) 100 MCG/2ML IJ SOLN: 25.0000 ug | INTRAMUSCULAR | Status: DC | PRN

## 2017-03-05 MED ORDER — PANTOPRAZOLE SODIUM 40 MG IV SOLR: 40.0000 mg | Freq: Every day | INTRAVENOUS | Status: DC

## 2017-03-05 MED ORDER — MIDAZOLAM HCL 2 MG/2ML IJ SOLN: 0.5000 mg | Freq: Once | INTRAMUSCULAR | Status: DC | PRN

## 2017-03-05 MED ORDER — ENOXAPARIN SODIUM 40 MG/0.4ML ~~LOC~~ SOLN
40.0000 mg | SUBCUTANEOUS | Status: DC
  Administered 2017-03-05 – 2017-03-06 (×2): 40 mg via SUBCUTANEOUS
  Filled 2017-03-05 (×2): qty 0.4

## 2017-03-05 MED ORDER — MEPERIDINE HCL 25 MG/ML IJ SOLN: 6.2500 mg | INTRAMUSCULAR | Status: DC | PRN

## 2017-03-05 MED ORDER — SERTRALINE HCL 50 MG PO TABS: 50.0000 mg | ORAL_TABLET | Freq: Every day | ORAL | Status: DC

## 2017-03-05 MED ORDER — FENTANYL CITRATE (PF) 100 MCG/2ML IJ SOLN: 100.0000 ug | INTRAMUSCULAR | 0 refills | Status: DC | PRN

## 2017-03-05 MED ORDER — PROMETHAZINE HCL 25 MG/ML IJ SOLN
6.2500 mg | INTRAMUSCULAR | Status: DC | PRN
Start: 2017-03-05 — End: 2017-03-05

## 2017-03-05 MED ORDER — SODIUM CHLORIDE 0.9 % IV SOLN
INTRAVENOUS | Status: DC
  Administered 2017-03-05: 10 mL/h via INTRAVENOUS

## 2017-03-05 MED ORDER — CHLORHEXIDINE GLUCONATE 0.12% ORAL RINSE (MEDLINE KIT): 15.0000 mL | Freq: Two times a day (BID) | OROMUCOSAL | 0 refills | Status: DC

## 2017-03-05 MED ORDER — INSULIN ASPART 100 UNIT/ML ~~LOC~~ SOLN: 0.0000 [IU] | SUBCUTANEOUS | 11 refills | Status: DC

## 2017-03-05 NOTE — Anesthesia Postprocedure Evaluation (Signed)
Anesthesia Post Note  Patient: [name]  Procedure(s) Performed: Procedure(s) (LRB): PANENDOSCOPY WITH POSSIBLE FOREIGN BODY REMOVAL (N/A)     Patient location during evaluation: ICU Anesthesia Type: General Level of consciousness: sedated, obtunded/minimal responses and patient remains intubated per anesthesia plan Pain management: pain level controlled Vital Signs Assessment: post-procedure vital signs reviewed and stable Respiratory status: patient on ventilator - see flowsheet for VS and patient remains intubated per anesthesia plan Cardiovascular status: blood pressure returned to baseline and stable Postop Assessment: no signs of nausea or vomiting Anesthetic complications: no Comments: Pt remains intubated as per Dr. Erik Obey for airway protection. Discussed with husband over phone 7pm 03/04/17 post op     Last Vitals:  Vitals:   03/04/17 0454 03/04/17 0924  BP: (!) 161/94 (!) 145/86  Pulse: 95 97  Resp: 18 20  Temp: 37.1 C 36.9 C    Last Pain:  Vitals:   03/04/17 0924  TempSrc: Oral  PainSc:                  Marliss Buttacavoli,E. Dorrene Bently

## 2017-03-05 NOTE — Progress Notes (Signed)
PULMONARY / CRITICAL CARE MEDICINE   Name: [name] MRN: [id number] DOB: [birth year]    ADMISSION DATE:  03/04/2017 CONSULTATION DATE:  03/04/2017  REFERRING MD:  Dr. Erik Obey  CHIEF COMPLAINT:  Ventilator management  Brief Patient Summary:   46 year old female with PMH as below, which is significant for Asthma, DM, Depression, and HTN. She was admitted initially on 5/30 to [facility] for asthma exacerbation requiring intubation. She is smoker who has been hospitalized in the past for this, but had never been intubated before. While intubated she required neuromuscular blockade in order to safely ventilate. She was able to be extubated 6/5. She was treated with azithromycin for sputum culture which grew moraxella, RVP also positive for rhinovirus. She also required CRRT for a short period while in ICU. Once air trapping and vent mechanic improved, her renal function did as well. She was able to transfer to medical floor, but remained weak, which was thought to be secondary to ICU aqcuired weakness.  She was later transferred to [facility] on 6/8 for further rehabilitation efforts.  On 6/12, ENT was consulted because patient reportedly coughed up to 3 pieces of foreign material which were concerning to be part of the ET tube. Pathology thought that it was some sort of a nonspecific plastic material. She also had mild hemoptysis. ENT felt that the best plan of action would be to proceed with laryngoscopy, bronchoscopy, esophagoscopy under general anesthesia.  On 6/13, she was taking the OR for above procedures. She was found to have intubation injury on the posterior vocal cords and posterior commissure along with subglottic granulation tissue. There was also a curvilinear foreign body noted that was possibly consistent with necrotic tracheal ring cartilage. ENT was not able to establish an adequate airway; therefore, she was left intubated.  Plans were to transfer her to [facility] for further evaluation.  While awaiting transfer, PCCM was asked to assist with ventilator management.   SUBJECTIVE:   Per RN, stable overnight, thick oral and ETT secretions- blood tinged. Awaiting transfer to [facility] per ENT  VITAL SIGNS: Temp:  [98.5 F (36.9 C)-98.8 F (37.1 C)] 98.5 F (36.9 C) (06/13 0924) Pulse Rate:  [88-99] 99 (06/13 1337) Resp:  [18-20] 18 (06/13 1337) BP: (140-162)/(86-94) 140/86 (06/13 1337) SpO2:  [97 %-100 %] 100 % (06/13 1337) FiO2 (%):  [60 %] 60 % (06/13 1337) Weight:  [105.7 kg (233 lb)] 105.7 kg (233 lb) (06/13 0454)  PHYSICAL EXAMINATION: General:  Obese female on MV, in NAD HEENT: MM pink/moist, ETT, OGT, PERRL, copious oral secretions Neuro: Sedate but awakens to voice, follows simple commands, MAE CV: s1s2 rrr, no m/r/g PULM: even/non-labored, lungs bilaterally clear, slightly diminished in bases GI: soft, non-tender, bsx4 active  Extremities: warm/dry, no edema  Skin: no rashes or lesions   LABS:  BMET  Recent Labs Lab 03/03/17 0507 03/04/17 1831 03/05/17 0302  NA 139 139 142  K 3.8 4.5 4.4  CL 105 105 106  CO2 24 26 26   BUN 18 12 14   CREATININE 1.06* 1.04* 1.16*  GLUCOSE 112* 202* 148*    Electrolytes  Recent Labs Lab 02/27/17 0452  03/03/17 0507 03/04/17 1831 03/05/17 0302  CALCIUM 8.7*  < > 8.9 8.8* 8.7*  MG  --   --   --  2.0  --   PHOS 5.5*  --   --  4.9*  --   < > = values in this interval not displayed.  CBC  Recent Labs Lab 03/03/17 0507 03/04/17 1831 03/05/17 0302  WBC 17.7* 20.3* 18.8*  HGB 10.9* 10.8* 10.1*  HCT 33.6* 33.6* 31.6*  PLT 381 370 377    Coag's No results for input(s): APTT, INR in the last 168 hours.  Sepsis Markers No results for input(s): LATICACIDVEN, PROCALCITON, O2SATVEN in the last 168 hours.  ABG  Recent Labs Lab 03/04/17 1605  PHART 7.407  PCO2ART 41.0  PO2ART 122*    Liver Enzymes  Recent Labs Lab 02/27/17 0452 02/28/17 0427  AST   --  17  ALT  --  26  ALKPHOS  --  66  BILITOT  --  0.4  ALBUMIN 2.4* 2.4*    Cardiac Enzymes No results for input(s): TROPONINI, PROBNP in the last 168 hours.  Glucose  Recent Labs Lab 03/04/17 0920 03/04/17 1545 03/04/17 1954 03/05/17 0011 03/05/17 0306 03/05/17 0751  GLUCAP 137* 170* 171* 158* 141* 89    Imaging Ct Soft Tissue Neck W Contrast  Result Date: 03/04/2017 CLINICAL DATA:  46 year old female status post intubation. Shortness of breath. EXAM: CT NECK WITH CONTRAST TECHNIQUE: Multidetector CT imaging of the neck was performed using the standard protocol following the bolus administration of intravenous contrast. CONTRAST:  79mL ISOVUE-300 IOPAMIDOL (ISOVUE-300) INJECTION 61% in conjunction with contrast enhanced imaging of the chest reported separately. COMPARISON:  Chest CT today reported separately. Neck radiographs 03/01/2017. CT head and face 09/24/2012. FINDINGS: Pharynx and larynx: Endotracheal tube and oral enteric tubes are in place. The endotracheal tube tip terminates 2.5 cm above the carina. The enteric tube continues into the thoracic esophagus. Subsequently limited detail of the larynx and oropharynx. The nasopharynx appears normal aside from a small volume of layering secretions. Parapharyngeal and retropharyngeal spaces appear within normal limits. Salivary glands: Sublingual space, submandibular glands and parotid glands appear normal. Thyroid: Negative. Lymph nodes: No cervical lymphadenopathy. Bilateral cervical lymph nodes are within normal limits. No subcutaneous gas. No focal soft tissue inflammation identified in the neck. Vascular: Reflux of venous contrast into the left IJ appears related to functional stenosis of the left innominate vein. Major vascular structures in the neck and at the skullbase appear patent. Left vertebral artery appears dominant. Limited intracranial: Negative. Visualized orbits: Negative. Mastoids and visualized paranasal sinuses:  Mild bubbly opacity in the left sphenoid sinus. Mild right sphenoid and posterior ethmoid mucosal thickening. Other visible paranasal sinuses and mastoids are stable and well pneumatized. Skeleton: No acute osseous abnormality identified. Upper chest: Reported separately today. IMPRESSION: 1. Intubated with appropriate positioning of visible endotracheal tube and oral enteric tube. 2. Subsequent limited detail of the pharynx and larynx. The deep soft tissue spaces of the neck appear normal. 3. Mild retained secretions in the nasopharynx and ethmoid/sphenoid inflammation probably related to #1. 4. See also Chest CT today reported separately. Electronically Signed   By: Genevie Ann M.D.   On: 03/04/2017 16:55   Ct Chest W Contrast  Result Date: 03/04/2017 CLINICAL DATA:  Inpatient. Patient with severe asthma episode approximately 2 weeks prior requiring intubation for 2 days. Patient re-presented with hemoptysis, foreign body reduction by cough, dyspnea and stridor. Status post direct laryngoscopy, bronchoscopy with biopsy and esophagoscopy earlier today, which demonstrated glottic and severe obstructive subglottic injury with apparent granulation tissue throughout the subglottic trachea and curvilinear foreign body possibly consistent with necrotic tracheal ring cartilage. Adequate airway was unable to the established and the patient was left intubated. EXAM: CT CHEST WITH CONTRAST TECHNIQUE: Multidetector CT imaging of the  chest was performed during intravenous contrast administration. CONTRAST:  139mL ISOVUE-300 IOPAMIDOL (ISOVUE-300) INJECTION 61%, 75mL ISOVUE-300 IOPAMIDOL (ISOVUE-300) INJECTION 61% COMPARISON:  Chest radiograph from earlier today. 05/03/2015 chest CT angiogram. FINDINGS: Cardiovascular: Mild cardiomegaly. No significant pericardial fluid/thickening. Thoracic aorta is normal in course and caliber. Brachiocephalic and left common carotid arteries share a common origin from the aortic arch.  Top-normal caliber pulmonary arteries (main pulmonary artery diameter 3.0 cm). No central pulmonary emboli. Mediastinum/Nodes: No discrete thyroid nodules. Unremarkable esophagus. Enteric tube enters the stomach, with the tip overlying the body of the stomach on the scout topogram. Simple fluid density 2.3 x 1.9 cm right upper paratracheal structure (series 3/ image 34) is not appreciably changed since 05/03/2015 chest CT angiogram study. No axillary adenopathy. Mildly enlarged 1.1 cm subcarinal node (series 3/ image 59), stable since 05/03/2015, suggesting benign reactive adenopathy. No additional pathologically enlarged mediastinal or hilar nodes. Lungs/Pleura: Endotracheal tube tip is 3.3 cm above the carina. There is irregular wall thickening and enhancement throughout the cartilaginous portion of the subglottic trachea (series 3/image 15), extending approximately 3-4 cm in craniocaudal extent. The tracheal wall appears relatively normal below the level of the thoracic inlet. The carina and mainstem bronchi walls appear normal. No intraluminal foreign body is seen in the central airways. No pneumothorax. No pleural effusion. Complete left lower lobe atelectasis. Segmental medial right lower lobe atelectasis. Central left upper lobe consolidation with associated volume loss is also favored to represent atelectasis. Mild patchy ground-glass opacities in the dependent right upper lobe and apical left upper lobe. Upper abdomen: Unremarkable. Musculoskeletal: No aggressive appearing focal osseous lesions. Mild thoracic spondylosis. IMPRESSION: 1. Endotracheal tube tip 3.3 cm above the carina. 2. Irregular wall thickening and enhancement throughout the cartilaginous portion of the subglottic trachea, extending approximately 3-4 cm in craniocaudal extent. This finding is nonspecific and most likely represents inflammatory change and/or tracheal wall hemorrhage given the recent episode of intubation. 3. No evidence of  an intraluminal foreign body in the central airways. 4. Enteric tube terminates in the body of the stomach . 5. Complete left lower lobe atelectasis. Segmental right lower lobe and left upper lobe atelectasis. 6. Mild patchy bilateral upper lobe ground-glass opacities, probably inflammatory, possibly due to aspiration pneumonitis. 7. Simple fluid density 2.3 cm round structure in the upper right paratracheal mediastinum is stable since 05/03/2015 chest CT study, favoring a benign etiology such as a bronchogenic cyst. 8. Mild subcarinal lymphadenopathy is stable since 2016, favoring benign reactive adenopathy. 9. Mild cardiomegaly. Electronically Signed   By: Ilona Sorrel M.D.   On: 03/04/2017 17:25   Portable Chest Xray  Result Date: 03/05/2017 CLINICAL DATA:  Respiratory failure.  Shortness of breath. EXAM: PORTABLE CHEST 1 VIEW COMPARISON:  03/04/2017 chest radiograph and CT FINDINGS: The endotracheal tube terminates 3.2 cm above the carina. Enteric tube courses into the stomach with tip not imaged. The cardiac silhouette remains mildly enlarged. Left lower lobe atelectasis has improved. The right lung is grossly clear. No sizable pleural effusion or pneumothorax is identified. IMPRESSION: 1. Support devices as above. 2. Improved left lower lobe aeration. Electronically Signed   By: Logan Bores M.D.   On: 03/05/2017 07:19   Portable Chest Xray  Result Date: 03/04/2017 CLINICAL DATA:  Status post intubation of the trachea and of the esophagus. EXAM: PORTABLE CHEST 1 VIEW COMPARISON:  Chest x-ray of March 01, 2017 FINDINGS: The endotracheal tube tip lies at or slightly above the carina. The esophagogastric tube tip in proximal port  project below the GE junction and the inferior margin of the image. The lungs are hypoinflated. There is an air bronchogram in the left lower lobe. There is no pneumothorax nor large pleural effusion. IMPRESSION: Low positioning of the endotracheal tube. Withdrawal by 3 cm is  recommended to avoid accidental right mainstem bronchus intubation with patient movement. The esophagogastric tube tip projects below the inferior margin of the image. Left lower lobe atelectasis or pneumonia. Electronically Signed   By: David  Martinique M.D.   On: 03/04/2017 14:09    STUDIES:  TTE 6/1 > LVEF 65% mild LVH, RV normal SLP 6/6 > mild aspiration risk, recs for regular thin liquid CT chest >> irregular wall thickening and enhancement throughout the cartilaginous portion of subglottic trachea extending approx 3-4 cm in craniocaudal extent - could represent inflammatory change and/or tracheal wall hemorrhage given recent intubation; no evidence of intraluminal FB in central airways, LLL atelectasis, segmental RLL and LUL atelectasis; mild patchy bilateral upper lobe ground-glass opacities, ETT 3.3 cm above carina; enteric tube in body of stomach, simple fluid density 2.3cm round structure in upper right paratracheal mediastinum- stable from prior, mild subcarinal lymphadenopathy- stable CT soft tissues neck 6/13 >> subsequent limited detail of pharynx and larynx; deep soft tissues spaces normal; mild retained secretions in nasopharynx and ethmoid/sphenoid inflammation due to intubation  CULTURES: 5/31 RVP  > + rhinovirus/enterovirus 5/31 BCx 2 > negative 6/3 blood culture > neg 6/3 resp culture > moraxella  ANTIBIOTICS: Rocephin 5/31x1 Azithromycin 500mg  IV daily > 6/09  SIGNIFICANT EVENTS  5/30 admit asthma > intubate 5/31-6/3 high FIO2/PEEP, Neuromuscular blocked 6/5 extubated 6/8 discharge to CIR 6/13 OR w/ENT  LINES/TUBES: LINES/TUBES: ETT 5/30 > 6/5; 6/13 >> OGT 5/30 > 6/5 Foley 5/30 > R IJ HD 5/31 > 6/4 PIV   ASSESSMENT / PLAN:  PULMONARY A: Intubated for airway protection Glottic and severe obstructive subglottic injury - intubation vs +/- FBO aspiration Asthma (normal PFTs 2012) Tobacco Abuse Prior Moraxella HCAP- completed zithromax 6/09 P:   PRVC 8  cc/kg Wean FiO2/ PEEP for sats > 92%  Daily SBT, but NO extubation CXR prn Continue brovanna and budesonide  Albuterol prn  Pending transfer to [facility] per ENT   Await CT of neck and chest   CARDIOVASCULAR A:  Hx HTN, HLD P:  ICU monitoring  Hold norvasc and losartan Goal MAP > 65  RENAL A:   No acute issues  P:   Trend BMP / urinary output Replace electrolytes as indicated Avoid nephrotoxic agents, ensure adequate renal perfusion  GASTROINTESTINAL A:   Hx GERD P:   Protonix for SUP NPO Consider starting TF- will need to check with ENT pending ? Future surgical intervention  HEMATOLOGIC A:   Anemia - stable P:  Trend CBC  Add lovenox sq for DVT prophylaxis  SCDs    INFECTIOUS A:   Leukocytosis - no fever, likely due to recent steroid taper P:   Trend WBC Monitor fever curve No indication for abx at this point  ENDOCRINE A:   DM P:   SSI CBG q 4hr   NEUROLOGIC A:   Hx depression, chronic back pain, migraines, neuropathy P:   RASS goal: 0/-1 Fentanyl prn Propofol gtt Holding home zoloft, hydrocodone, neurotin   FAMILY  - Updates: No family at bedside 6/13 or 6/14.  - Inter-disciplinary family meet or Palliative Care meeting due by:  03/11/2017   CCT 40 mins  [name], AGACNP-BC [facility] Pgr:  [phone] or if no answer [phone] 03/05/2017, 9:49 AM

## 2017-03-05 NOTE — Care Management Note (Signed)
Case Management Note  Patient Details  Name: Tanessa Tidd [name] MRN: [id number] Date of Birth: 02/17/1971  Subjective/Objective:    Pt readmitted from CIR s/p laryngoscopy and due to complications she was left intubated                Action/Plan:  PTA from CIR - CIR following pt.  Pt remains intubated   Expected Discharge Date:                  Expected Discharge Plan:  IP Rehab Facility  In-House Referral:  Clinical Social Work  Discharge planning Services  CM Consult  Post Acute Care Choice:    Choice offered to:     DME Arranged:    DME Agency:     HH Arranged:    HH Agency:     Status of Service:     If discussed at [employer], dates discussed:    Additional Comments:  Maryclare Labrador, RN 03/05/2017, 3:20 PM

## 2017-03-05 NOTE — Discharge Summary (Signed)
Physician Discharge Summary       Patient ID: [name] MRN: [id number] DOB/AGE: [birth year] 46 y.o.  Admit date: 03/04/2017 Discharge date: 03/05/2017  Discharge Diagnoses:  Glottic and severe obstructive subglottic injury Asthma HTN HLD Depression Migraines GERD  Detailed Hospital Course:   46 year old female with PMH of Asthma (normal PFTs in 2012), chronic upper airway irritation syndrome, allergic rhinitis, tobacco abuse, T2DM, depression, headache, GERD, HLD, and HTN.  She was admitted initially on 5/30 to [facility] for asthma exacerbation requiring intubation.  She is a current smoker who has been hospitalized in the past for this, but had never been intubated before.  While intubated, she required neuromuscular blockade in order to ventilate appropriately.  She was  Liberated from the ventilator on 6/5. She was treated with azithromycin given moraxella in her sputum and RVP was also positive for rhinovirus. She also required CRRT for a short period while in ICU due to AKI.  Renal function improved nicely with CRRT.  She was able to transfer to medical floor, but remained weak, which was thought to be secondary to ICU aqcuired weakness.  She was later transferred to [facility] 6/8 for further rehabilitation efforts.  On 6/12, ENT was consulted because patient reportedly coughed up to 3 pieces of foreign material which were concerning to be part of the ET tube. Pathology thought that it was some sort of a nonspecific plastic material. She also had mild hemoptysis. ENT felt that the best plan of action would be to proceed with laryngoscopy, bronchoscopy, esophagoscopy under general anesthesia.  On 6/13, she was taking the OR for above procedures. She was found to have an intubation injury on the posterior vocal cords and posterior commissure along with subglotticgranulation tissue.There was also a curvilinear foreign body noted that was possibly consistent with  necrotic tracheal ring cartilage. ENT was not able to establish an adequate airway as it was felt that tracheostomy was high risk; therefore, she was left intubated.  ENT recommendations were to transfer patient to [facility] for further evaluation and management.  On 6/14, she was transferred to [facility].    Discharge Plan by active problems   Glottic and severe obstructive subglottic injury P:  Transfer to [facility] for specialized otolaryngolical evaluation. Continue full vent support. Sedation with propofol and prn fentanyl. RASS goal: -1.  Asthma Chronic upper airway irritation  P:  Continue brovanna and budesonide. Albuterol PRN.  HTN HLD P: Holding home norvasc, lipitor, and losartan.  Depression Migraines P:  Continue zoloft. Holding home Neurontin, hydrocodone, methocarbamol, sumatriptan.  GERD P: Continue Protonix   SIGNIFICANT EVENTS: 5/30 Admit asthma > intubated 5/31-6/3 high FIO2/PEEP, Neuromuscular blocked 6/5 Extubated 6/8 Discharged to CIR 6/13 OR w/ENT 6/14 discharge to [facility]  STUDIES: TTE 6/1 > LVEF 65% mild LVH, RV normal SLP 6/6 > mild aspiration risk, recs for regular thin liquid CT chest >> irregular wall thickening and enhancement throughout the cartilaginous portion of subglottic trachea extending approx 3-4 cm in craniocaudal extent - could represent inflammatory change and/or tracheal wall hemorrhage given recent intubation; no evidence of intraluminal FB in central airways, LLL atelectasis, segmental RLL and LUL atelectasis; mild patchy bilateral upper lobe ground-glass opacities, ETT 3.3 cm above carina; enteric tube in body of stomach, simple fluid density 2.3cm round structure in upper right paratracheal mediastinum- stable from prior, mild subcarinal lymphadenopathy- stable CT soft tissues neck 6/13 >> subsequent limited detail of pharynx and larynx; deep soft tissues spaces normal; mild  retained secretions in nasopharynx and  ethmoid/sphenoid inflammation due to intubation  CULTURES: 5/31 RVP >+ rhinovirus/enterovirus 5/31 BCx 2 >negative 6/3 blood culture >neg 6/3 resp culture >moraxella  ANTIBIOTICS: Rocephin 5/31x1 Azithromycin 6/06 >6/09  LINES/TUBES: ETT 5/30 >6/5; 6/13 >> OGT 5/30 >6/5 Foley 5/30 > R IJ HD 5/31 > 6/4 PIV   CONSULTS: ENT SLP   DISCHARGE EXAM: General:  Adult female sitting in bed in NAD on MV HEENT: MM pink/moist, ETT, OGT, copious oral secretions, PERRL, sclerae anicteric  Neuro: Awake, follows commands, MAE CV: s1s2 rrr, no m/r/g PULM: even/non-labored, lungs bilaterally coarse, diminished in bases  GI: soft, non-tender, bsx4 active  Extremities: warm/dry, no edema  Skin: no rashes or    BP 113/70 (BP Location: Right Arm)   Pulse 95   Temp 98 F (36.7 C) (Oral)   Resp 17   Ht _0  (1.549 m)   Wt 106 kg (233 lb 11 oz)   SpO2 100%   BMI 44.15 kg/m     Labs at discharge Lab Results  Component Value Date   CREATININE 1.16 (H) 03/05/2017   BUN 14 03/05/2017   NA 142 03/05/2017   K 4.4 03/05/2017   CL 106 03/05/2017   CO2 26 03/05/2017   Lab Results  Component Value Date   WBC 18.8 (H) 03/05/2017   HGB 10.1 (L) 03/05/2017   HCT 31.6 (L) 03/05/2017   MCV 88.0 03/05/2017   PLT 377 03/05/2017   Lab Results  Component Value Date   ALT 26 02/28/2017   AST 17 02/28/2017   ALKPHOS 66 02/28/2017   BILITOT 0.4 02/28/2017   Lab Results  Component Value Date   INR 1.11 08/21/2016   INR 1.00 03/01/2014    Current radiology studies Ct Soft Tissue Neck W Contrast  Result Date: 03/04/2017 CLINICAL DATA:  46 year old female status post intubation. Shortness of breath. EXAM: CT NECK WITH CONTRAST TECHNIQUE: Multidetector CT imaging of the neck was performed using the standard protocol following the bolus administration of intravenous contrast. CONTRAST:  16m ISOVUE-300 IOPAMIDOL (ISOVUE-300) INJECTION 61% in conjunction with contrast enhanced  imaging of the chest reported separately. COMPARISON:  Chest CT today reported separately. Neck radiographs 03/01/2017. CT head and face 09/24/2012. FINDINGS: Pharynx and larynx: Endotracheal tube and oral enteric tubes are in place. The endotracheal tube tip terminates 2.5 cm above the carina. The enteric tube continues into the thoracic esophagus. Subsequently limited detail of the larynx and oropharynx. The nasopharynx appears normal aside from a small volume of layering secretions. Parapharyngeal and retropharyngeal spaces appear within normal limits. Salivary glands: Sublingual space, submandibular glands and parotid glands appear normal. Thyroid: Negative. Lymph nodes: No cervical lymphadenopathy. Bilateral cervical lymph nodes are within normal limits. No subcutaneous gas. No focal soft tissue inflammation identified in the neck. Vascular: Reflux of venous contrast into the left IJ appears related to functional stenosis of the left innominate vein. Major vascular structures in the neck and at the skullbase appear patent. Left vertebral artery appears dominant. Limited intracranial: Negative. Visualized orbits: Negative. Mastoids and visualized paranasal sinuses: Mild bubbly opacity in the left sphenoid sinus. Mild right sphenoid and posterior ethmoid mucosal thickening. Other visible paranasal sinuses and mastoids are stable and well pneumatized. Skeleton: No acute osseous abnormality identified. Upper chest: Reported separately today. IMPRESSION: 1. Intubated with appropriate positioning of visible endotracheal tube and oral enteric tube. 2. Subsequent limited detail of the pharynx and larynx. The deep soft tissue spaces of the neck appear normal.  3. Mild retained secretions in the nasopharynx and ethmoid/sphenoid inflammation probably related to #1. 4. See also Chest CT today reported separately. Electronically Signed   By: Genevie Ann M.D.   On: 03/04/2017 16:55   Ct Chest W Contrast  Result Date:  03/04/2017 CLINICAL DATA:  Inpatient. Patient with severe asthma episode approximately 2 weeks prior requiring intubation for 2 days. Patient re-presented with hemoptysis, foreign body reduction by cough, dyspnea and stridor. Status post direct laryngoscopy, bronchoscopy with biopsy and esophagoscopy earlier today, which demonstrated glottic and severe obstructive subglottic injury with apparent granulation tissue throughout the subglottic trachea and curvilinear foreign body possibly consistent with necrotic tracheal ring cartilage. Adequate airway was unable to the established and the patient was left intubated. EXAM: CT CHEST WITH CONTRAST TECHNIQUE: Multidetector CT imaging of the chest was performed during intravenous contrast administration. CONTRAST:  142m ISOVUE-300 IOPAMIDOL (ISOVUE-300) INJECTION 61%, 761mISOVUE-300 IOPAMIDOL (ISOVUE-300) INJECTION 61% COMPARISON:  Chest radiograph from earlier today. 05/03/2015 chest CT angiogram. FINDINGS: Cardiovascular: Mild cardiomegaly. No significant pericardial fluid/thickening. Thoracic aorta is normal in course and caliber. Brachiocephalic and left common carotid arteries share a common origin from the aortic arch. Top-normal caliber pulmonary arteries (main pulmonary artery diameter 3.0 cm). No central pulmonary emboli. Mediastinum/Nodes: No discrete thyroid nodules. Unremarkable esophagus. Enteric tube enters the stomach, with the tip overlying the body of the stomach on the scout topogram. Simple fluid density 2.3 x 1.9 cm right upper paratracheal structure (series 3/ image 34) is not appreciably changed since 05/03/2015 chest CT angiogram study. No axillary adenopathy. Mildly enlarged 1.1 cm subcarinal node (series 3/ image 59), stable since 05/03/2015, suggesting benign reactive adenopathy. No additional pathologically enlarged mediastinal or hilar nodes. Lungs/Pleura: Endotracheal tube tip is 3.3 cm above the carina. There is irregular wall thickening and  enhancement throughout the cartilaginous portion of the subglottic trachea (series 3/image 15), extending approximately 3-4 cm in craniocaudal extent. The tracheal wall appears relatively normal below the level of the thoracic inlet. The carina and mainstem bronchi walls appear normal. No intraluminal foreign body is seen in the central airways. No pneumothorax. No pleural effusion. Complete left lower lobe atelectasis. Segmental medial right lower lobe atelectasis. Central left upper lobe consolidation with associated volume loss is also favored to represent atelectasis. Mild patchy ground-glass opacities in the dependent right upper lobe and apical left upper lobe. Upper abdomen: Unremarkable. Musculoskeletal: No aggressive appearing focal osseous lesions. Mild thoracic spondylosis. IMPRESSION: 1. Endotracheal tube tip 3.3 cm above the carina. 2. Irregular wall thickening and enhancement throughout the cartilaginous portion of the subglottic trachea, extending approximately 3-4 cm in craniocaudal extent. This finding is nonspecific and most likely represents inflammatory change and/or tracheal wall hemorrhage given the recent episode of intubation. 3. No evidence of an intraluminal foreign body in the central airways. 4. Enteric tube terminates in the body of the stomach . 5. Complete left lower lobe atelectasis. Segmental right lower lobe and left upper lobe atelectasis. 6. Mild patchy bilateral upper lobe ground-glass opacities, probably inflammatory, possibly due to aspiration pneumonitis. 7. Simple fluid density 2.3 cm round structure in the upper right paratracheal mediastinum is stable since 05/03/2015 chest CT study, favoring a benign etiology such as a bronchogenic cyst. 8. Mild subcarinal lymphadenopathy is stable since 2016, favoring benign reactive adenopathy. 9. Mild cardiomegaly. Electronically Signed   By: JaIlona Sorrel.D.   On: 03/04/2017 17:25   Portable Chest Xray  Result Date:  03/05/2017 CLINICAL DATA:  Respiratory failure.  Shortness of  breath. EXAM: PORTABLE CHEST 1 VIEW COMPARISON:  03/04/2017 chest radiograph and CT FINDINGS: The endotracheal tube terminates 3.2 cm above the carina. Enteric tube courses into the stomach with tip not imaged. The cardiac silhouette remains mildly enlarged. Left lower lobe atelectasis has improved. The right lung is grossly clear. No sizable pleural effusion or pneumothorax is identified. IMPRESSION: 1. Support devices as above. 2. Improved left lower lobe aeration. Electronically Signed   By: Logan Bores M.D.   On: 03/05/2017 07:19   Portable Chest Xray  Result Date: 03/04/2017 CLINICAL DATA:  Status post intubation of the trachea and of the esophagus. EXAM: PORTABLE CHEST 1 VIEW COMPARISON:  Chest x-ray of March 01, 2017 FINDINGS: The endotracheal tube tip lies at or slightly above the carina. The esophagogastric tube tip in proximal port project below the GE junction and the inferior margin of the image. The lungs are hypoinflated. There is an air bronchogram in the left lower lobe. There is no pneumothorax nor large pleural effusion. IMPRESSION: Low positioning of the endotracheal tube. Withdrawal by 3 cm is recommended to avoid accidental right mainstem bronchus intubation with patient movement. The esophagogastric tube tip projects below the inferior margin of the image. Left lower lobe atelectasis or pneumonia. Electronically Signed   By: David  Martinique M.D.   On: 03/04/2017 14:09    Allergies as of 03/05/2017      Reactions   Reglan [metoclopramide] Other (See Comments)   Panic attack   Topamax [topiramate]    Per patient, face swells and fingers tingles      Medication List    STOP taking these medications   ACCU-CHEK AVIVA PLUS w/Device Kit   accu-chek multiclix lancets   accu-chek softclix lancets   acetaminophen-codeine 300-30 MG tablet Commonly known as:  TYLENOL #3   amLODipine 5 MG tablet Commonly known as:   NORVASC   aspirin EC 325 MG tablet   bromocriptine 2.5 MG tablet Commonly known as:  PARLODEL   dapagliflozin propanediol 10 MG Tabs tablet Commonly known as:  FARXIGA   Dulaglutide 1.5 MG/0.5ML Sopn Commonly known as:  TRULICITY   gabapentin 562 MG capsule Commonly known as:  NEURONTIN   glucose blood test strip Commonly known as:  ACCU-CHEK AVIVA PLUS   Insulin Pen Needle 32G X 4 MM Misc   methocarbamol 500 MG tablet Commonly known as:  ROBAXIN   oxyCODONE-acetaminophen 5-325 MG tablet Commonly known as:  PERCOCET   pantoprazole 40 MG tablet Commonly known as:  PROTONIX Replaced by:  pantoprazole 40 MG injection   predniSONE 50 MG tablet Commonly known as:  DELTASONE   prochlorperazine 10 MG tablet Commonly known as:  COMPAZINE   repaglinide 2 MG tablet Commonly known as:  PRANDIN   simvastatin 10 MG tablet Commonly known as:  ZOCOR   SUMAtriptan 100 MG tablet Commonly known as:  IMITREX     TAKE these medications   albuterol (2.5 MG/3ML) 0.083% nebulizer solution Commonly known as:  PROVENTIL Take 3 mLs (2.5 mg total) by nebulization every 6 (six) hours as needed for wheezing or shortness of breath. Reported on 02/22/2016 What changed:  Another medication with the same name was removed. Continue taking this medication, and follow the directions you see here.   arformoterol 15 MCG/2ML Nebu Commonly known as:  BROVANA Take 2 mLs (15 mcg total) by nebulization 2 (two) times daily.   budesonide 0.5 MG/2ML nebulizer solution Commonly known as:  PULMICORT Take 2 mLs (0.5 mg total) by nebulization  2 (two) times daily.   chlorhexidine gluconate (MEDLINE KIT) 0.12 % solution Commonly known as:  PERIDEX 15 mLs by Mouth Rinse route 2 (two) times daily.   enoxaparin 40 MG/0.4ML injection Commonly known as:  LOVENOX Inject 0.4 mLs (40 mg total) into the skin daily. Start taking on:  03/06/2017   fentaNYL 100 MCG/2ML injection Commonly known as:   SUBLIMAZE Inject 2 mLs (100 mcg total) into the vein every 15 (fifteen) minutes as needed (to achieve RASS goal.).   fentaNYL 100 MCG/2ML injection Commonly known as:  SUBLIMAZE Inject 2 mLs (100 mcg total) into the vein every 2 (two) hours as needed (to maintain RASS goal.).   fentaNYL 100 MCG/2ML injection Commonly known as:  SUBLIMAZE Inject 0.5-1 mLs (25-50 mcg total) into the vein every 5 (five) minutes as needed ((up to 150 mcg) for pain score greater than 4).   insulin aspart 100 UNIT/ML injection Commonly known as:  novoLOG Inject 0-20 Units into the skin every 4 (four) hours.   mouth rinse Liqd solution 15 mLs by Mouth Rinse route QID. Start taking on:  03/06/2017   olopatadine 0.1 % ophthalmic solution Commonly known as:  PATANOL Place 1 drop into the right eye 2 (two) times daily.   pantoprazole 40 MG injection Commonly known as:  PROTONIX Inject 40 mg into the vein daily. Start taking on:  03/06/2017 Replaces:  pantoprazole 40 MG tablet   propofol 1000 MG/100ML Emul injection Commonly known as:  DIPRIVAN Inject 0-5,285 mcg/min into the vein continuous.   sertraline 50 MG tablet Commonly known as:  ZOLOFT Place 1 tablet (50 mg total) into feeding tube daily. Start taking on:  03/06/2017 What changed:  how to take this       Disposition:  Discharged to [facility].  Discharged Condition: [name] has met maximum benefit of inpatient care and is medically stable and cleared for discharge.   Time spent on disposition:  Greater than 30 minutes.   [name], AGACNP-BC [facility] Pgr: [phone] or if no answer [phone] 03/06/2017, 2:01 PM

## 2017-03-06 ENCOUNTER — Inpatient Hospital Stay (HOSPITAL_COMMUNITY): Payer: Medicare Other

## 2017-03-06 DIAGNOSIS — Z87891 Personal history of nicotine dependence: Secondary | ICD-10-CM | POA: Diagnosis not present

## 2017-03-06 DIAGNOSIS — J386 Stenosis of larynx: Secondary | ICD-10-CM | POA: Insufficient documentation

## 2017-03-06 DIAGNOSIS — Z9049 Acquired absence of other specified parts of digestive tract: Secondary | ICD-10-CM | POA: Diagnosis not present

## 2017-03-06 DIAGNOSIS — G8929 Other chronic pain: Secondary | ICD-10-CM | POA: Diagnosis not present

## 2017-03-06 DIAGNOSIS — J45909 Unspecified asthma, uncomplicated: Secondary | ICD-10-CM | POA: Diagnosis not present

## 2017-03-06 DIAGNOSIS — J9811 Atelectasis: Secondary | ICD-10-CM | POA: Diagnosis present

## 2017-03-06 DIAGNOSIS — Z9114 Patient's other noncompliance with medication regimen: Secondary | ICD-10-CM | POA: Diagnosis not present

## 2017-03-06 DIAGNOSIS — G4733 Obstructive sleep apnea (adult) (pediatric): Secondary | ICD-10-CM | POA: Diagnosis not present

## 2017-03-06 DIAGNOSIS — Z90710 Acquired absence of both cervix and uterus: Secondary | ICD-10-CM | POA: Diagnosis not present

## 2017-03-06 DIAGNOSIS — R918 Other nonspecific abnormal finding of lung field: Secondary | ICD-10-CM | POA: Diagnosis not present

## 2017-03-06 DIAGNOSIS — R0789 Other chest pain: Secondary | ICD-10-CM | POA: Diagnosis not present

## 2017-03-06 DIAGNOSIS — Z6841 Body Mass Index (BMI) 40.0 and over, adult: Secondary | ICD-10-CM | POA: Diagnosis not present

## 2017-03-06 DIAGNOSIS — I110 Hypertensive heart disease with heart failure: Secondary | ICD-10-CM | POA: Diagnosis not present

## 2017-03-06 DIAGNOSIS — J4550 Severe persistent asthma, uncomplicated: Secondary | ICD-10-CM | POA: Diagnosis not present

## 2017-03-06 DIAGNOSIS — R079 Chest pain, unspecified: Secondary | ICD-10-CM | POA: Diagnosis not present

## 2017-03-06 DIAGNOSIS — G43909 Migraine, unspecified, not intractable, without status migrainosus: Secondary | ICD-10-CM | POA: Diagnosis not present

## 2017-03-06 DIAGNOSIS — Z430 Encounter for attention to tracheostomy: Secondary | ICD-10-CM | POA: Diagnosis not present

## 2017-03-06 DIAGNOSIS — Z7984 Long term (current) use of oral hypoglycemic drugs: Secondary | ICD-10-CM | POA: Diagnosis not present

## 2017-03-06 DIAGNOSIS — Z9119 Patient's noncompliance with other medical treatment and regimen: Secondary | ICD-10-CM | POA: Diagnosis not present

## 2017-03-06 DIAGNOSIS — K219 Gastro-esophageal reflux disease without esophagitis: Secondary | ICD-10-CM | POA: Diagnosis not present

## 2017-03-06 DIAGNOSIS — N3000 Acute cystitis without hematuria: Secondary | ICD-10-CM | POA: Diagnosis present

## 2017-03-06 DIAGNOSIS — M549 Dorsalgia, unspecified: Secondary | ICD-10-CM | POA: Diagnosis present

## 2017-03-06 DIAGNOSIS — J384 Edema of larynx: Secondary | ICD-10-CM | POA: Diagnosis not present

## 2017-03-06 DIAGNOSIS — N390 Urinary tract infection, site not specified: Secondary | ICD-10-CM | POA: Diagnosis not present

## 2017-03-06 DIAGNOSIS — F329 Major depressive disorder, single episode, unspecified: Secondary | ICD-10-CM | POA: Diagnosis not present

## 2017-03-06 DIAGNOSIS — F419 Anxiety disorder, unspecified: Secondary | ICD-10-CM | POA: Diagnosis present

## 2017-03-06 DIAGNOSIS — J441 Chronic obstructive pulmonary disease with (acute) exacerbation: Secondary | ICD-10-CM | POA: Diagnosis present

## 2017-03-06 DIAGNOSIS — E119 Type 2 diabetes mellitus without complications: Secondary | ICD-10-CM | POA: Diagnosis not present

## 2017-03-06 DIAGNOSIS — I5032 Chronic diastolic (congestive) heart failure: Secondary | ICD-10-CM | POA: Diagnosis not present

## 2017-03-06 DIAGNOSIS — Z930 Tracheostomy status: Secondary | ICD-10-CM | POA: Diagnosis not present

## 2017-03-06 DIAGNOSIS — Z794 Long term (current) use of insulin: Secondary | ICD-10-CM | POA: Diagnosis not present

## 2017-03-06 DIAGNOSIS — J9601 Acute respiratory failure with hypoxia: Secondary | ICD-10-CM | POA: Diagnosis not present

## 2017-03-06 DIAGNOSIS — A4189 Other specified sepsis: Secondary | ICD-10-CM | POA: Diagnosis not present

## 2017-03-06 DIAGNOSIS — A419 Sepsis, unspecified organism: Secondary | ICD-10-CM | POA: Diagnosis not present

## 2017-03-06 DIAGNOSIS — Z9911 Dependence on respirator [ventilator] status: Secondary | ICD-10-CM | POA: Diagnosis not present

## 2017-03-06 DIAGNOSIS — J0410 Acute tracheitis without obstruction: Secondary | ICD-10-CM | POA: Diagnosis present

## 2017-03-06 DIAGNOSIS — B961 Klebsiella pneumoniae [K. pneumoniae] as the cause of diseases classified elsewhere: Secondary | ICD-10-CM | POA: Diagnosis not present

## 2017-03-06 DIAGNOSIS — Z4659 Encounter for fitting and adjustment of other gastrointestinal appliance and device: Secondary | ICD-10-CM | POA: Diagnosis not present

## 2017-03-06 LAB — CBC
HCT: 29.1 % — ABNORMAL LOW (ref 36.0–46.0)
Hemoglobin: 9.5 g/dL — ABNORMAL LOW (ref 12.0–15.0)
MCH: 29.4 pg (ref 26.0–34.0)
MCHC: 32.6 g/dL (ref 30.0–36.0)
MCV: 90.1 fL (ref 78.0–100.0)
PLATELETS: 323 10*3/uL (ref 150–400)
RBC: 3.23 MIL/uL — AB (ref 3.87–5.11)
RDW: 14.7 % (ref 11.5–15.5)
WBC: 10.4 10*3/uL (ref 4.0–10.5)

## 2017-03-06 LAB — GLUCOSE, CAPILLARY
GLUCOSE-CAPILLARY: 110 mg/dL — AB (ref 65–99)
GLUCOSE-CAPILLARY: 115 mg/dL — AB (ref 65–99)
GLUCOSE-CAPILLARY: 91 mg/dL (ref 65–99)
Glucose-Capillary: 94 mg/dL (ref 65–99)

## 2017-03-06 LAB — BASIC METABOLIC PANEL
Anion gap: 8 (ref 5–15)
BUN: 16 mg/dL (ref 6–20)
CALCIUM: 8.5 mg/dL — AB (ref 8.9–10.3)
CO2: 25 mmol/L (ref 22–32)
CREATININE: 1.08 mg/dL — AB (ref 0.44–1.00)
Chloride: 109 mmol/L (ref 101–111)
GFR calc Af Amer: >60 mL/min (ref >60)
GFR calc non Af Amer: >60 mL/min (ref >60)
GLUCOSE: 119 mg/dL — AB (ref 65–99)
Potassium: 4.2 mmol/L (ref 3.5–5.1)
SODIUM: 142 mmol/L (ref 135–145)

## 2017-03-06 LAB — RENAL FUNCTION PANEL
ALBUMIN: 2.4 g/dL — AB (ref 3.5–5.0)
Anion gap: 8 (ref 5–15)
BUN: 17 mg/dL (ref 6–20)
CALCIUM: 8.4 mg/dL — AB (ref 8.9–10.3)
CO2: 25 mmol/L (ref 22–32)
CREATININE: 1.15 mg/dL — AB (ref 0.44–1.00)
Chloride: 108 mmol/L (ref 101–111)
GFR calc Af Amer: >60 mL/min (ref >60)
GFR, EST NON AFRICAN AMERICAN: 56 mL/min — AB (ref >60)
GLUCOSE: 102 mg/dL — AB (ref 65–99)
PHOSPHORUS: 4.2 mg/dL (ref 2.5–4.6)
POTASSIUM: 3.3 mmol/L — AB (ref 3.5–5.1)
SODIUM: 141 mmol/L (ref 135–145)

## 2017-03-06 LAB — MAGNESIUM: MAGNESIUM: 1.9 mg/dL (ref 1.7–2.4)

## 2017-03-06 MED ORDER — VITAL HIGH PROTEIN PO LIQD: 1000.0000 mL | ORAL | Status: DC

## 2017-03-06 MED ORDER — POTASSIUM CHLORIDE 20 MEQ/15ML (10%) PO SOLN
40.0000 meq | Freq: Once | ORAL | Status: AC
  Administered 2017-03-06: 40 meq

## 2017-03-06 NOTE — Consult Note (Signed)
   [facility] CM Inpatient Consult   03/06/2017  Crystina Borrayo [name] 1970/12/04 [id number]   Patient was assessed for multiple hospitalizations and re-admission, in the [place] as a beneficiary of Medicare.  Chart review reveals patient/family is pursing Inpatient Rehab. Patient with possible transfer to [facility] per MD note  Patient admitted with Asthma Exacerbation requiring intubation.  Currently on ventilator support. Will follow for progress and disposition needs, if appropriate.  For question, please contact:  Natividad Brood, RN BSN [facility] Liaison  [phone] business mobile phone Toll free office [phone]

## 2017-03-06 NOTE — Clinical Social Work Note (Signed)
Clinical Social Worker requested media documentation on a disc for patient transfer to [facility] - Radiology completed and CSW provided directly to the unit.  CSW available for support as needed.  [place]in [place], [place]

## 2017-03-06 NOTE — Progress Notes (Signed)
PULMONARY / CRITICAL CARE MEDICINE   Name: [name] MRN: [id number] DOB: 1970/10/14    ADMISSION DATE:  03/04/2017 CONSULTATION DATE:  03/04/2017  REFERRING MD:  Dr. Erik Obey  CHIEF COMPLAINT:  Ventilator management  Brief Patient Summary:   46 year old female with PMH as below, which is significant for Asthma, DM, Depression, and HTN. She was admitted initially on 5/30 to [facility] for asthma exacerbation requiring intubation. She is smoker who has been hospitalized in the past for this, but had never been intubated before. While intubated she required neuromuscular blockade in order to safely ventilate. She was able to be extubated 6/5. She was treated with azithromycin for sputum culture which grew moraxella, RVP also positive for rhinovirus. She also required CRRT for a short period while in ICU. Once air trapping and vent mechanic improved, her renal function did as well. She was able to transfer to medical floor, but remained weak, which was thought to be secondary to ICU aqcuired weakness.  She was later transferred to [facility] on 6/8 for further rehabilitation efforts.  On 6/12, ENT was consulted because patient reportedly coughed up to 3 pieces of foreign material which were concerning to be part of the ET tube. Pathology thought that it was some sort of a nonspecific plastic material. She also had mild hemoptysis. ENT felt that the best plan of action would be to proceed with laryngoscopy, bronchoscopy, esophagoscopy under general anesthesia.  On 6/13, she was taking the OR for above procedures. She was found to have intubation injury on the posterior vocal cords and posterior commissure along with subglottic granulation tissue. There was also a curvilinear foreign body noted that was possibly consistent with necrotic tracheal ring cartilage. ENT was not able to establish an adequate airway; therefore, she was left intubated.  Plans were to transfer her to [facility] for further evaluation.  While awaiting transfer, PCCM was asked to assist with ventilator management.   SUBJECTIVE:   No events overnight.   Awaiting transfer to [facility]  VITAL SIGNS: Temp:  [98.5 F (36.9 C)-98.8 F (37.1 C)] 98.5 F (36.9 C) (06/13 0924) Pulse Rate:  [88-99] 99 (06/13 1337) Resp:  [18-20] 18 (06/13 1337) BP: (140-162)/(86-94) 140/86 (06/13 1337) SpO2:  [97 %-100 %] 100 % (06/13 1337) FiO2 (%):  [60 %] 60 % (06/13 1337) Weight:  [105.7 kg (233 lb)] 105.7 kg (233 lb) (06/13 0454)  PHYSICAL EXAMINATION:  General:  Adult female sitting in bed in NAD on MV HEENT: MM pink/moist, ETT, OGT, copious oral secretions, PERRL, sclerae anicteric  Neuro: Awake, follows commands, MAE CV: s1s2 rrr, no m/r/g PULM: even/non-labored, lungs bilaterally coarse, diminished in bases  GI: soft, non-tender, bsx4 active  Extremities: warm/dry, no edema  Skin: no rashes or lesions  LABS:  BMET  Recent Labs Lab 03/04/17 1831 03/05/17 0302 03/06/17 0338  NA 139 142 141  K 4.5 4.4 3.3*  CL 105 106 108  CO2 26 26 25   BUN 12 14 17   CREATININE 1.04* 1.16* 1.15*  GLUCOSE 202* 148* 102*    Electrolytes  Recent Labs Lab 03/04/17 1831 03/05/17 0302 03/06/17 0338  CALCIUM 8.8* 8.7* 8.4*  MG 2.0  --  1.9  PHOS 4.9*  --  4.2    CBC  Recent Labs Lab 03/04/17 1831 03/05/17 0302 03/06/17 0338  WBC 20.3* 18.8* 10.4  HGB 10.8* 10.1* 9.5*  HCT 33.6* 31.6* 29.1*  PLT 370 377 323    Coag's No  results for input(s): APTT, INR in the last 168 hours.  Sepsis Markers No results for input(s): LATICACIDVEN, PROCALCITON, O2SATVEN in the last 168 hours.  ABG  Recent Labs Lab 03/04/17 1605  PHART 7.407  PCO2ART 41.0  PO2ART 122*    Liver Enzymes  Recent Labs Lab 02/28/17 0427 03/06/17 0338  AST 17  --   ALT 26  --   ALKPHOS 66  --   BILITOT 0.4  --   ALBUMIN 2.4* 2.4*    Cardiac Enzymes No results for input(s): TROPONINI, PROBNP in the  last 168 hours.  Glucose  Recent Labs Lab 03/05/17 1226 03/05/17 1517 03/05/17 1947 03/05/17 2319 03/06/17 0307 03/06/17 0725  GLUCAP 97 109* 98 101* 94 110*    Imaging Dg Chest Port 1 View  Result Date: 03/06/2017 CLINICAL DATA:  Acute respiratory failure, history of asthma, tracheal stenosis, diabetes, current smoker. EXAM: PORTABLE CHEST 1 VIEW COMPARISON:  Portable chest x-ray of September 04, 2017 FINDINGS: The lungs are reasonably well inflated. There is persistent atelectasis or infiltrate in the left lower lobe. A small amount of pleural fluid on the left may be present. The cardiac silhouette is enlarged but stable. The pulmonary vascularity is not engorged. The endotracheal tube tip lies 3.7 cm above the carina. The esophagogastric tube tip may lie in the proximal duodenum. IMPRESSION: Fairly stable appearance of the chest. Persistent left lower lobe atelectasis or pneumonia. Stable cardiomegaly without significant pulmonary vascular congestion. The support tubes are in stable position. Electronically Signed   By: David  Martinique M.D.   On: 03/06/2017 07:23    STUDIES:  TTE 6/1 > LVEF 65% mild LVH, RV normal SLP 6/6 > mild aspiration risk, recs for regular thin liquid CT chest >> irregular wall thickening and enhancement throughout the cartilaginous portion of subglottic trachea extending approx 3-4 cm in craniocaudal extent - could represent inflammatory change and/or tracheal wall hemorrhage given recent intubation; no evidence of intraluminal FB in central airways, LLL atelectasis, segmental RLL and LUL atelectasis; mild patchy bilateral upper lobe ground-glass opacities, ETT 3.3 cm above carina; enteric tube in body of stomach, simple fluid density 2.3cm round structure in upper right paratracheal mediastinum- stable from prior, mild subcarinal lymphadenopathy- stable CT soft tissues neck 6/13 >> subsequent limited detail of pharynx and larynx; deep soft tissues spaces normal;  mild retained secretions in nasopharynx and ethmoid/sphenoid inflammation due to intubation  CULTURES: 5/31 RVP  > + rhinovirus/enterovirus 5/31 BCx 2 > negative 6/3 blood culture > neg 6/3 resp culture > moraxella  ANTIBIOTICS: Rocephin 5/31x1 Azithromycin 500mg  IV daily > 6/09  SIGNIFICANT EVENTS  5/30 admit asthma > intubate 5/31-6/3 high FIO2/PEEP, Neuromuscular blocked 6/5 extubated 6/8 discharge to CIR 6/13 OR w/ENT  LINES/TUBES: LINES/TUBES: ETT 5/30 > 6/5; 6/13 >> OGT 5/30 > 6/5 Foley 5/30 >out R IJ HD 5/31 > 6/4 PIV   ASSESSMENT / PLAN:  PULMONARY A: Intubated for airway protection Glottic and severe obstructive subglottic injury - intubation vs +/- FBO aspiration Asthma (normal PFTs 2012) Tobacco Abuse Prior Moraxella HCAP- completed zithromax 6/09 P:   PRVC 8 cc/kg Wean FiO2/ PEEP for sats > 92%  Daily SBT, but NO extubation CXR prn Continue brovanna and budesonide  Albuterol prn  Pending transfer to [facility] per ENT   Await CT of neck and chest   CARDIOVASCULAR A:  Hx HTN, HLD P:  ICU monitoring  Hold norvasc and losartan Goal MAP > 65  RENAL A:   No acute issues  Purewick catheter P:   S/p KCL 40 meq 6/15 NS at 50 ml/hr Trend BMP / urinary output Replace electrolytes as indicated Avoid nephrotoxic agents, ensure adequate renal perfusion  GASTROINTESTINAL A:   Hx GERD P:   Protonix for SUP NPO Start trickle TF  HEMATOLOGIC A:   Anemia - stable P:  Trend CBC  Add lovenox sq for DVT prophylaxis  SCDs    INFECTIOUS A:   Leukocytosis - resolved P:   Trend WBC Monitor fever curve  ENDOCRINE A:   DM P:   SSI CBG q 4hr   NEUROLOGIC A:   Hx depression, chronic back pain, migraines, neuropathy P:   RASS goal: 0/-1 Fentanyl prn Propofol gtt Holding home zoloft, hydrocodone, neurotin   FAMILY  - Updates: No family at bedside 6/13, 6/14, or 6/15 on rounds.  - Inter-disciplinary family meet or Palliative Care  meeting due by:  03/11/2017  Global: Awaiting transfer to [facility], hopeful to get a bed today.  CCT 40 mins   [name], AGACNP-BC [facility] Pgr: [phone] or if no answer [phone] 03/06/2017, 9:17 AM

## 2017-03-06 NOTE — Care Management Note (Signed)
Case Management Note  Patient Details  Name: Harmonee Tozer [name] MRN: [id number] Date of Birth: Aug 02, 1971  Subjective/Objective:    Pt readmitted from CIR s/p laryngoscopy and due to complications she was left intubated                Action/Plan:  PTA from CIR - CIR following pt.  Pt remains intubated   Expected Discharge Date:  03/05/17               Expected Discharge Plan:  IP Rehab Facility  In-House Referral:  Clinical Social Work  Discharge planning Services  CM Consult  Post Acute Care Choice:    Choice offered to:     DME Arranged:    DME Agency:     HH Arranged:    HH Agency:     Status of Service:     If discussed at [employer], dates discussed:    Additional Comments: 03/06/2017 Pt will tentatively transfer to [facility] once bed is available - attending actively working possible transfer - acceptance has of yet to be gained  Maryclare Labrador, RN 03/06/2017, 8:20 AM

## 2017-03-06 NOTE — Progress Notes (Signed)
[place] Progress Note Patient Name: [name] DOB: Dec 10, 1970 MRN: [id number]   Date of Service  03/06/2017  HPI/Events of Note  hypokalemia  eICU Interventions  Potassium replaced     Intervention Category Intermediate Interventions: Electrolyte abnormality - evaluation and management  DETERDING,ELIZABETH 03/06/2017, 5:24 AM

## 2017-03-06 NOTE — Progress Notes (Signed)
Initial Nutrition Assessment  DOCUMENTATION CODES:   Morbid obesity  INTERVENTION:    Continue trickle feeding via OGT with Vital High Protein at 20 ml/h (240 ml per day)  Provides 480 kcal, 42 gm protein, 401 ml free water daily  When able to advance TF rate, goal is Vital High Protein at 55 ml/h (1320 kcal, 116 gm protein, 1104 ml free water)  NUTRITION DIAGNOSIS:   Inadequate oral intake related to inability to eat as evidenced by NPO status.  GOAL:   Provide needs based on ASPEN/SCCM guidelines  MONITOR:   Vent status, Labs, TF tolerance, I & O's  REASON FOR ASSESSMENT:   Consult Enteral/tube feeding initiation and management  ASSESSMENT:   46 yo female with PMH of asthma, DM, depression, HTN who was initially admitted on 5/30 to [facility] with asthma exacerbation that required intubation. Transferred to CIR on 6/8. Required transfer back to inpatient after coughing up foreign material; S/P laryngoscopy, bronchoscopy, and esophagoscopy on 6/13.   Discussed patient in ICU rounds and with RN today. Received MD Consult for TF initiation and management. Trickle TF has been initiated with Vital High Protein at 20 ml/h to provide 480 kcal, 42 gm protein, 401 ml free water daily. Patient is awaiting transfer to [facility] for further treatment of tracheal stenosis.  Patient is currently intubated on ventilator support MV: 7.3 L/min Temp (24hrs), Avg:99.2 F (37.3 C), Min:98 F (36.7 C), Max:100.8 F (38.2 C)  Propofol: 6.3 ml/hr providing 166 kcal per day. Labs reviewed: potassium 3.3 (L) Medications reviewed and include Propofol.  Diet Order:  Diet NPO time specified  Skin:  Reviewed, no issues  Last BM:  6/10  Height:   Ht Readings from Last 1 Encounters:  03/04/17 5\' 1"  (1.549 m)    Weight:   Wt Readings from Last 1 Encounters:  03/05/17 233 lb 11 oz (106 kg)    Ideal Body Weight:  47.7 kg  BMI:  Body mass index is 44.15 kg/m.  Estimated Nutritional  Needs:   Kcal:  6834-1962  Protein:  119 gm  Fluid:  1.6-1.8 L  EDUCATION NEEDS:   No education needs identified at this time  Molli Barrows, Mary Esther, [place], Emma Pager [phone] After Hours Pager [phone]

## 2017-03-14 DIAGNOSIS — Z930 Tracheostomy status: Secondary | ICD-10-CM

## 2017-03-14 DIAGNOSIS — N3090 Cystitis, unspecified without hematuria: Secondary | ICD-10-CM | POA: Insufficient documentation

## 2017-03-14 DIAGNOSIS — B961 Klebsiella pneumoniae [K. pneumoniae] as the cause of diseases classified elsewhere: Secondary | ICD-10-CM

## 2017-03-14 HISTORY — DX: Cystitis, unspecified without hematuria: N30.90

## 2017-03-14 HISTORY — DX: Tracheostomy status: Z93.0

## 2017-03-14 HISTORY — DX: Klebsiella pneumoniae (k. pneumoniae) as the cause of diseases classified elsewhere: B96.1

## 2017-03-17 ENCOUNTER — Ambulatory Visit: Payer: Medicare Other | Admitting: Endocrinology

## 2017-03-18 DIAGNOSIS — L929 Granulomatous disorder of the skin and subcutaneous tissue, unspecified: Secondary | ICD-10-CM | POA: Diagnosis not present

## 2017-03-18 DIAGNOSIS — Z930 Tracheostomy status: Secondary | ICD-10-CM | POA: Diagnosis not present

## 2017-03-18 DIAGNOSIS — G43909 Migraine, unspecified, not intractable, without status migrainosus: Secondary | ICD-10-CM | POA: Diagnosis present

## 2017-03-18 DIAGNOSIS — J9588 Other intraoperative complications of respiratory system, not elsewhere classified: Secondary | ICD-10-CM | POA: Diagnosis present

## 2017-03-18 DIAGNOSIS — Z7409 Other reduced mobility: Secondary | ICD-10-CM | POA: Diagnosis not present

## 2017-03-18 DIAGNOSIS — N3000 Acute cystitis without hematuria: Secondary | ICD-10-CM | POA: Diagnosis not present

## 2017-03-18 DIAGNOSIS — Z87891 Personal history of nicotine dependence: Secondary | ICD-10-CM | POA: Diagnosis not present

## 2017-03-18 DIAGNOSIS — G8929 Other chronic pain: Secondary | ICD-10-CM | POA: Diagnosis present

## 2017-03-18 DIAGNOSIS — Z9989 Dependence on other enabling machines and devices: Secondary | ICD-10-CM | POA: Diagnosis not present

## 2017-03-18 DIAGNOSIS — B961 Klebsiella pneumoniae [K. pneumoniae] as the cause of diseases classified elsewhere: Secondary | ICD-10-CM | POA: Diagnosis not present

## 2017-03-18 DIAGNOSIS — I110 Hypertensive heart disease with heart failure: Secondary | ICD-10-CM | POA: Diagnosis not present

## 2017-03-18 DIAGNOSIS — R531 Weakness: Secondary | ICD-10-CM | POA: Diagnosis not present

## 2017-03-18 DIAGNOSIS — M5116 Intervertebral disc disorders with radiculopathy, lumbar region: Secondary | ICD-10-CM | POA: Diagnosis present

## 2017-03-18 DIAGNOSIS — I10 Essential (primary) hypertension: Secondary | ICD-10-CM | POA: Diagnosis not present

## 2017-03-18 DIAGNOSIS — Z885 Allergy status to narcotic agent status: Secondary | ICD-10-CM | POA: Diagnosis not present

## 2017-03-18 DIAGNOSIS — R062 Wheezing: Secondary | ICD-10-CM | POA: Diagnosis not present

## 2017-03-18 DIAGNOSIS — J9620 Acute and chronic respiratory failure, unspecified whether with hypoxia or hypercapnia: Secondary | ICD-10-CM | POA: Diagnosis not present

## 2017-03-18 DIAGNOSIS — K219 Gastro-esophageal reflux disease without esophagitis: Secondary | ICD-10-CM | POA: Diagnosis not present

## 2017-03-18 DIAGNOSIS — M6281 Muscle weakness (generalized): Secondary | ICD-10-CM | POA: Diagnosis not present

## 2017-03-18 DIAGNOSIS — J45909 Unspecified asthma, uncomplicated: Secondary | ICD-10-CM | POA: Diagnosis not present

## 2017-03-18 DIAGNOSIS — F17210 Nicotine dependence, cigarettes, uncomplicated: Secondary | ICD-10-CM | POA: Diagnosis not present

## 2017-03-18 DIAGNOSIS — J399 Disease of upper respiratory tract, unspecified: Secondary | ICD-10-CM | POA: Diagnosis not present

## 2017-03-18 DIAGNOSIS — Z9911 Dependence on respirator [ventilator] status: Secondary | ICD-10-CM | POA: Diagnosis not present

## 2017-03-18 DIAGNOSIS — J9601 Acute respiratory failure with hypoxia: Secondary | ICD-10-CM | POA: Diagnosis not present

## 2017-03-18 DIAGNOSIS — J387 Other diseases of larynx: Secondary | ICD-10-CM | POA: Diagnosis not present

## 2017-03-18 DIAGNOSIS — I5033 Acute on chronic diastolic (congestive) heart failure: Secondary | ICD-10-CM | POA: Diagnosis not present

## 2017-03-18 DIAGNOSIS — G4733 Obstructive sleep apnea (adult) (pediatric): Secondary | ICD-10-CM | POA: Diagnosis present

## 2017-03-18 DIAGNOSIS — R042 Hemoptysis: Secondary | ICD-10-CM | POA: Diagnosis not present

## 2017-03-18 DIAGNOSIS — Z6841 Body Mass Index (BMI) 40.0 and over, adult: Secondary | ICD-10-CM | POA: Diagnosis not present

## 2017-03-18 DIAGNOSIS — J4520 Mild intermittent asthma, uncomplicated: Secondary | ICD-10-CM | POA: Diagnosis not present

## 2017-03-18 DIAGNOSIS — J386 Stenosis of larynx: Secondary | ICD-10-CM | POA: Diagnosis not present

## 2017-03-18 DIAGNOSIS — Z888 Allergy status to other drugs, medicaments and biological substances status: Secondary | ICD-10-CM | POA: Diagnosis not present

## 2017-03-18 DIAGNOSIS — J9690 Respiratory failure, unspecified, unspecified whether with hypoxia or hypercapnia: Secondary | ICD-10-CM | POA: Diagnosis not present

## 2017-03-18 DIAGNOSIS — M1990 Unspecified osteoarthritis, unspecified site: Secondary | ICD-10-CM | POA: Diagnosis not present

## 2017-03-18 DIAGNOSIS — F331 Major depressive disorder, recurrent, moderate: Secondary | ICD-10-CM | POA: Diagnosis not present

## 2017-03-18 DIAGNOSIS — R262 Difficulty in walking, not elsewhere classified: Secondary | ICD-10-CM | POA: Diagnosis not present

## 2017-03-18 DIAGNOSIS — F419 Anxiety disorder, unspecified: Secondary | ICD-10-CM | POA: Diagnosis not present

## 2017-03-18 DIAGNOSIS — J9600 Acute respiratory failure, unspecified whether with hypoxia or hypercapnia: Secondary | ICD-10-CM | POA: Diagnosis not present

## 2017-03-18 DIAGNOSIS — M7989 Other specified soft tissue disorders: Secondary | ICD-10-CM | POA: Diagnosis not present

## 2017-03-18 DIAGNOSIS — J9621 Acute and chronic respiratory failure with hypoxia: Secondary | ICD-10-CM | POA: Diagnosis not present

## 2017-03-18 DIAGNOSIS — I5032 Chronic diastolic (congestive) heart failure: Secondary | ICD-10-CM | POA: Diagnosis not present

## 2017-03-18 DIAGNOSIS — R5381 Other malaise: Secondary | ICD-10-CM | POA: Diagnosis not present

## 2017-03-21 IMAGING — CT CT HEAD W/O CM
3 of 4 series · 18 of 47 positions shown, 21 images · non-contrast
Comparison: January 30, 2015

CLINICAL DATA: Left-sided facial and extremity numbness and
weakness

EXAM:
CT HEAD WITHOUT CONTRAST
TECHNIQUE: Contiguous axial images were obtained from the base of the skull
through the vertex without intravenous contrast.

[Series 201: head w/o, idose (1) · axial · non-contrast · 0.41mm/px · z∈[+97,+217]mm · 12 of 30 slices shown, 15 images]
[im 3/30  brain]
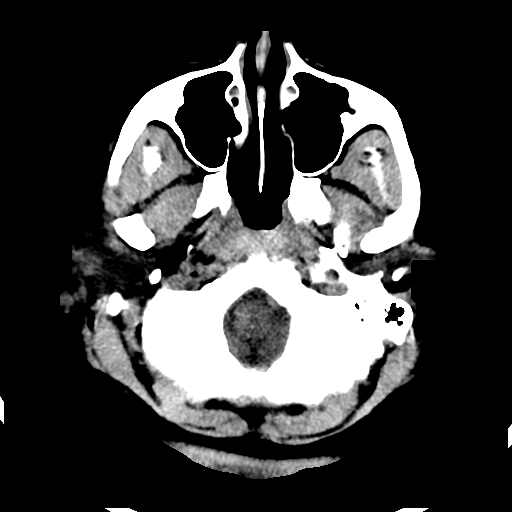
[im 3/30  bone]
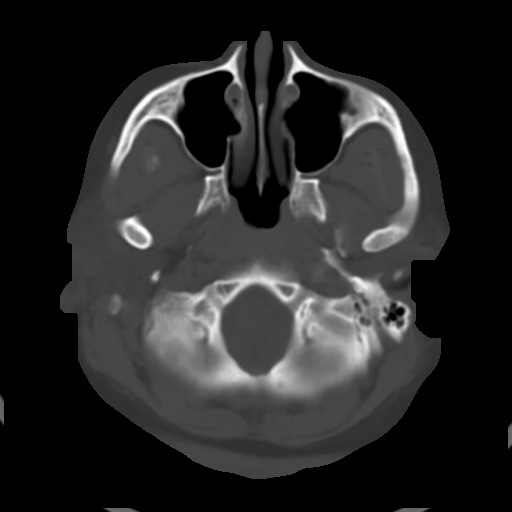
[im 5/30  brain]
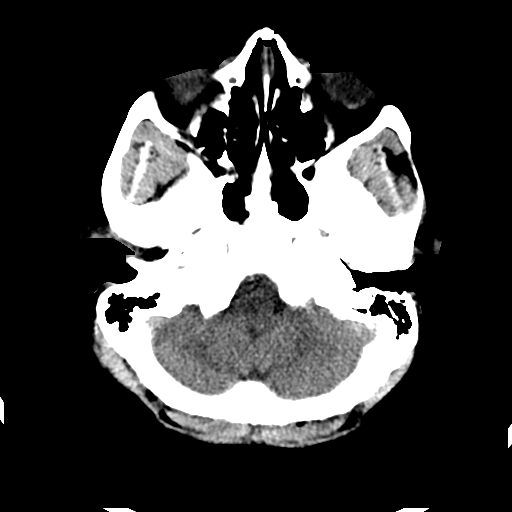
[im 7/30  brain]
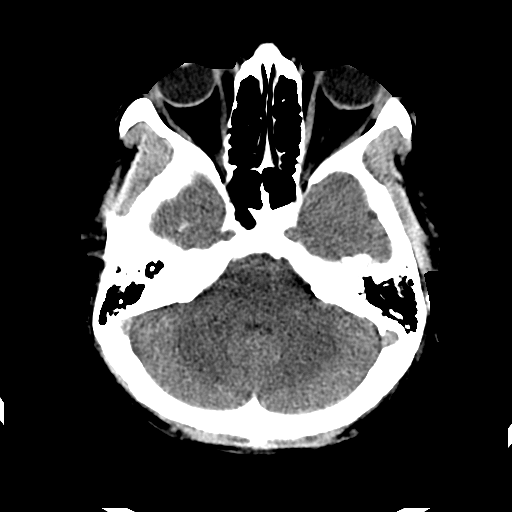
[im 9/30  brain]
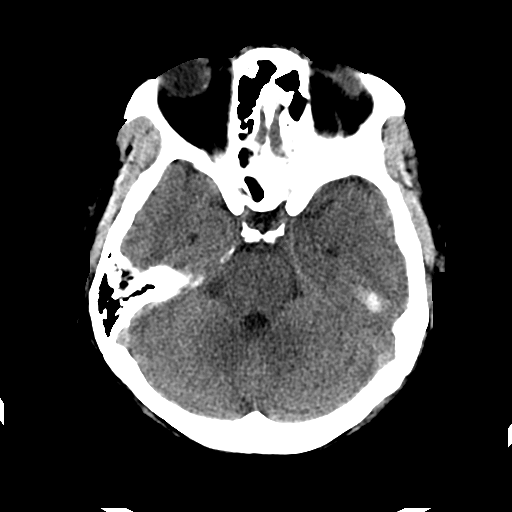
[im 11/30  brain]
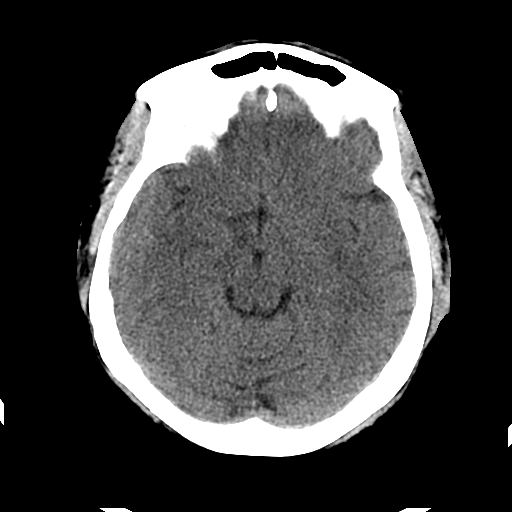
[im 11/30  bone]
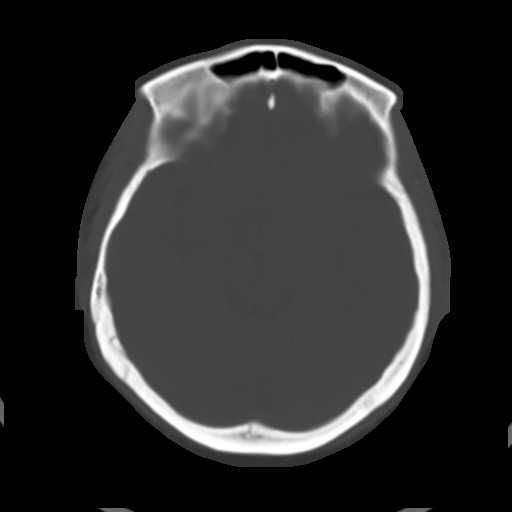
[im 13/30  brain]
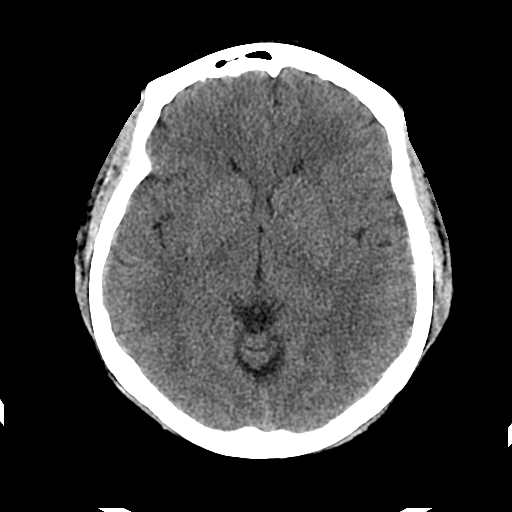
[im 17/30  brain]
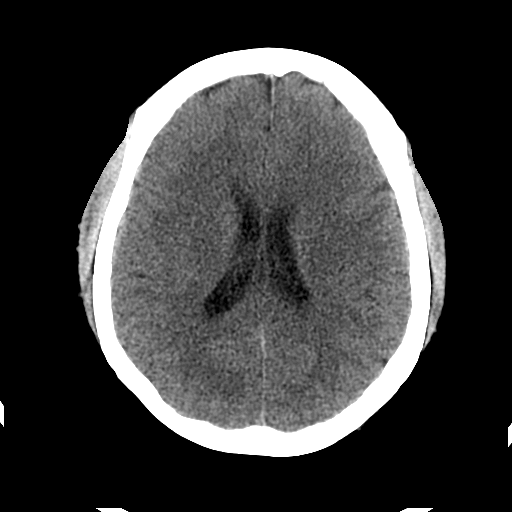
[im 19/30  brain]
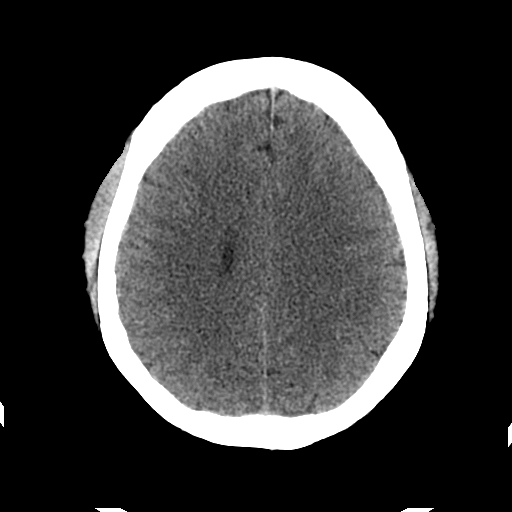
[im 21/30  brain]
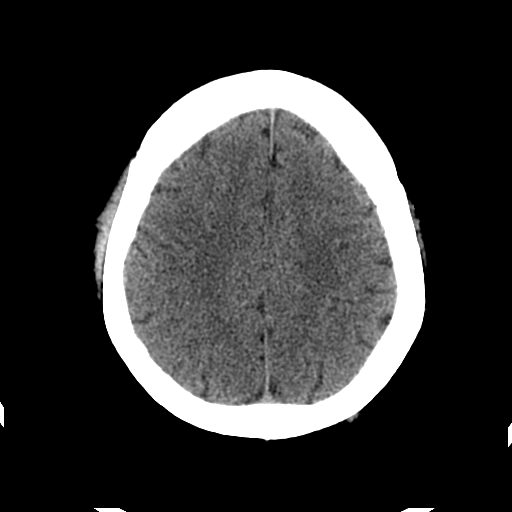
[im 21/30  bone]
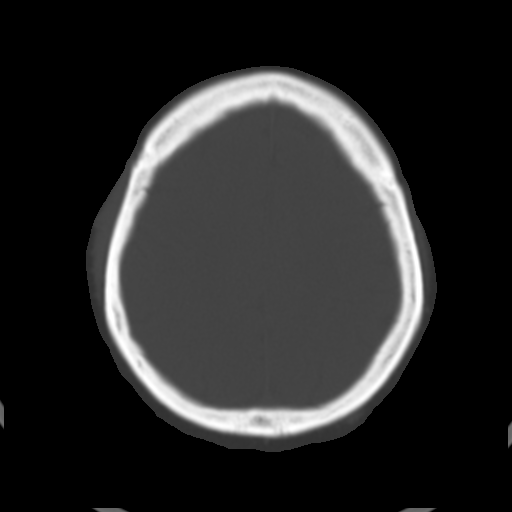
[im 23/30  brain]
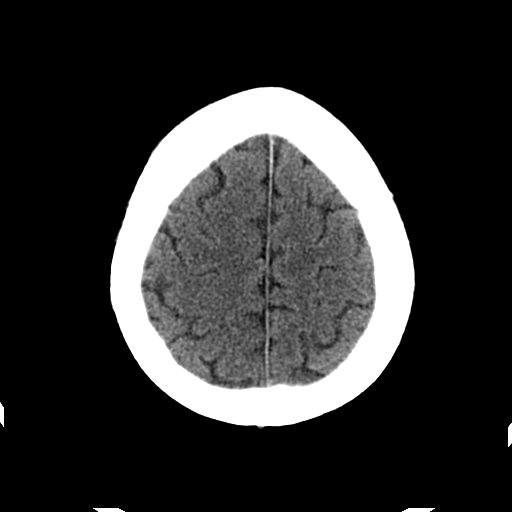
[im 25/30  brain]
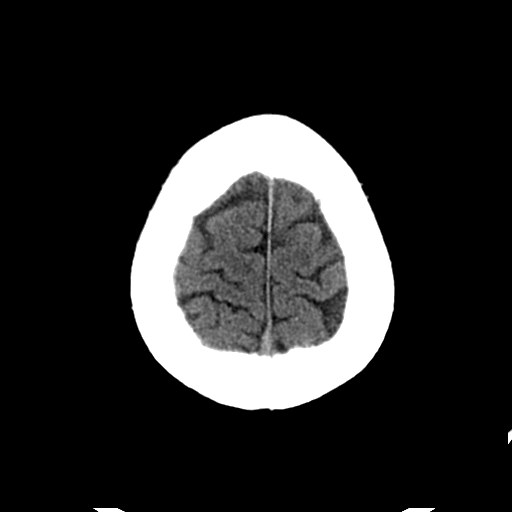
[im 27/30  brain]
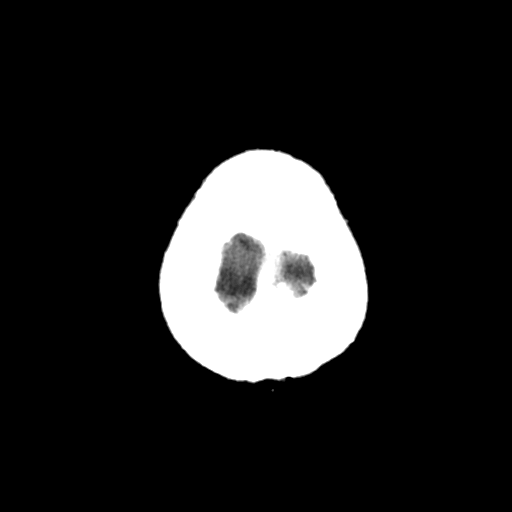

[Series 203: coronal st, idose (1) · coronal · 0.40mm/px · 3 of 65 slices shown]
[im 22/65  brain]
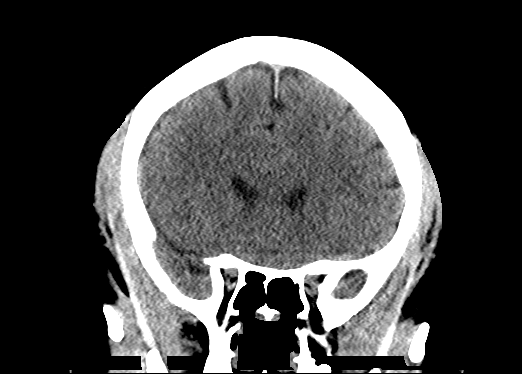
[im 29/65  brain]
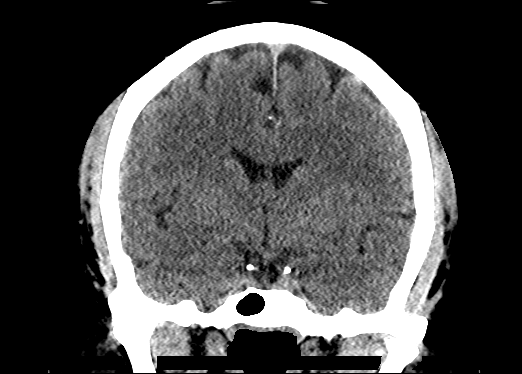
[im 36/65  brain]
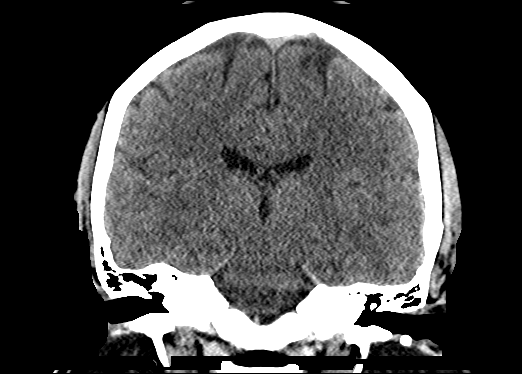

[Series 204: sagittal st, idose (1) · sagittal · 0.40mm/px · 3 of 65 slices shown]
[im 22/65  brain]
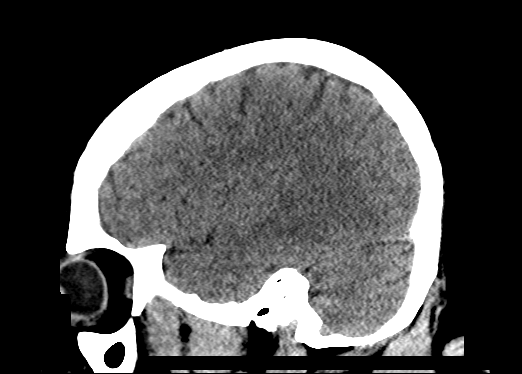
[im 33/65  brain]
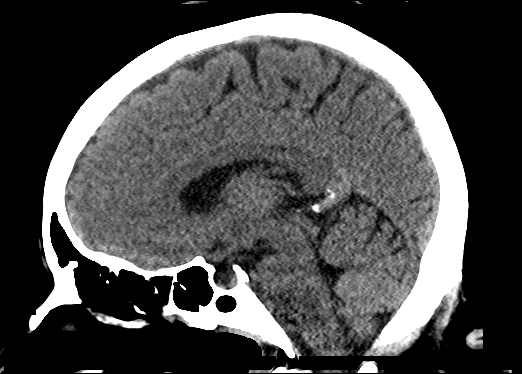
[im 43/65  brain]
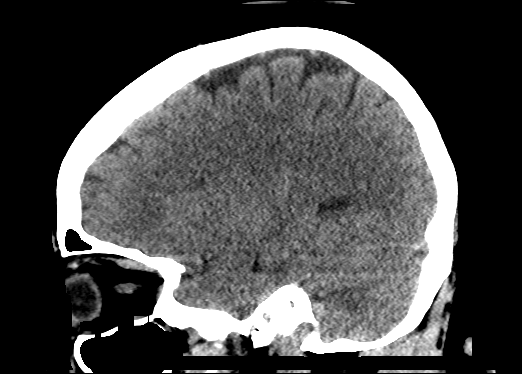

[18 of 47 positions shown; findings below may reference images not displayed]

FINDINGS: Brain: The ventricles are normal in size and configuration. There is
no intracranial mass, hemorrhage, extra-axial fluid collection, or
midline shift. Gray-white compartments appear normal. No acute
infarct evident.

Vascular: No hyperdense vessel. No appreciable vascular
calcification.

Skull: Bony calvarium appears intact.

Sinuses/Orbits: There is a small retention cyst in the posterior
left maxillary antrum. Other paranasal sinuses are clear. Orbits
appear symmetric bilaterally.

Other: Mastoid air cells are clear.
IMPRESSION: Small retention cyst in the posterior left maxillary antrum. No
intracranial mass, hemorrhage, or extra-axial fluid collection.
Gray-white compartments appear normal.

## 2017-03-26 DIAGNOSIS — R1310 Dysphagia, unspecified: Secondary | ICD-10-CM | POA: Insufficient documentation

## 2017-03-26 DIAGNOSIS — Z930 Tracheostomy status: Secondary | ICD-10-CM | POA: Insufficient documentation

## 2017-04-13 ENCOUNTER — Telehealth: Payer: Self-pay | Admitting: *Deleted

## 2017-04-13 NOTE — Telephone Encounter (Signed)
Called and spoke with Cephus Slater and she stated that the appt has been resecheduled for the pt to 8/1 and nothing further is needed.

## 2017-04-16 DIAGNOSIS — E119 Type 2 diabetes mellitus without complications: Secondary | ICD-10-CM | POA: Diagnosis not present

## 2017-04-16 DIAGNOSIS — J398 Other specified diseases of upper respiratory tract: Secondary | ICD-10-CM | POA: Diagnosis not present

## 2017-04-16 DIAGNOSIS — Z430 Encounter for attention to tracheostomy: Secondary | ICD-10-CM | POA: Diagnosis not present

## 2017-04-16 DIAGNOSIS — J9611 Chronic respiratory failure with hypoxia: Secondary | ICD-10-CM | POA: Diagnosis not present

## 2017-04-16 DIAGNOSIS — J45909 Unspecified asthma, uncomplicated: Secondary | ICD-10-CM | POA: Diagnosis not present

## 2017-04-16 DIAGNOSIS — J386 Stenosis of larynx: Secondary | ICD-10-CM | POA: Diagnosis not present

## 2017-04-17 DIAGNOSIS — Z430 Encounter for attention to tracheostomy: Secondary | ICD-10-CM | POA: Diagnosis not present

## 2017-04-17 DIAGNOSIS — J386 Stenosis of larynx: Secondary | ICD-10-CM | POA: Diagnosis not present

## 2017-04-17 DIAGNOSIS — J45909 Unspecified asthma, uncomplicated: Secondary | ICD-10-CM | POA: Diagnosis not present

## 2017-04-17 DIAGNOSIS — E119 Type 2 diabetes mellitus without complications: Secondary | ICD-10-CM | POA: Diagnosis not present

## 2017-04-17 DIAGNOSIS — J398 Other specified diseases of upper respiratory tract: Secondary | ICD-10-CM | POA: Diagnosis not present

## 2017-04-17 DIAGNOSIS — J9611 Chronic respiratory failure with hypoxia: Secondary | ICD-10-CM | POA: Diagnosis not present

## 2017-04-20 DIAGNOSIS — J386 Stenosis of larynx: Secondary | ICD-10-CM | POA: Diagnosis not present

## 2017-04-20 DIAGNOSIS — J9611 Chronic respiratory failure with hypoxia: Secondary | ICD-10-CM | POA: Diagnosis not present

## 2017-04-20 DIAGNOSIS — Z430 Encounter for attention to tracheostomy: Secondary | ICD-10-CM | POA: Diagnosis not present

## 2017-04-20 DIAGNOSIS — J398 Other specified diseases of upper respiratory tract: Secondary | ICD-10-CM | POA: Diagnosis not present

## 2017-04-20 DIAGNOSIS — J45909 Unspecified asthma, uncomplicated: Secondary | ICD-10-CM | POA: Diagnosis not present

## 2017-04-20 DIAGNOSIS — E119 Type 2 diabetes mellitus without complications: Secondary | ICD-10-CM | POA: Diagnosis not present

## 2017-04-21 DIAGNOSIS — E119 Type 2 diabetes mellitus without complications: Secondary | ICD-10-CM | POA: Diagnosis not present

## 2017-04-21 DIAGNOSIS — J45909 Unspecified asthma, uncomplicated: Secondary | ICD-10-CM | POA: Diagnosis not present

## 2017-04-21 DIAGNOSIS — Z430 Encounter for attention to tracheostomy: Secondary | ICD-10-CM | POA: Diagnosis not present

## 2017-04-21 DIAGNOSIS — J9611 Chronic respiratory failure with hypoxia: Secondary | ICD-10-CM | POA: Diagnosis not present

## 2017-04-21 DIAGNOSIS — J398 Other specified diseases of upper respiratory tract: Secondary | ICD-10-CM | POA: Diagnosis not present

## 2017-04-21 DIAGNOSIS — J386 Stenosis of larynx: Secondary | ICD-10-CM | POA: Diagnosis not present

## 2017-04-22 ENCOUNTER — Inpatient Hospital Stay: Payer: Medicare Other | Admitting: Pulmonary Disease

## 2017-04-23 DIAGNOSIS — Z79899 Other long term (current) drug therapy: Secondary | ICD-10-CM | POA: Diagnosis not present

## 2017-04-23 DIAGNOSIS — Z7951 Long term (current) use of inhaled steroids: Secondary | ICD-10-CM | POA: Diagnosis not present

## 2017-04-23 DIAGNOSIS — I110 Hypertensive heart disease with heart failure: Secondary | ICD-10-CM | POA: Diagnosis not present

## 2017-04-23 DIAGNOSIS — Z794 Long term (current) use of insulin: Secondary | ICD-10-CM | POA: Diagnosis not present

## 2017-04-23 DIAGNOSIS — I5030 Unspecified diastolic (congestive) heart failure: Secondary | ICD-10-CM | POA: Diagnosis not present

## 2017-04-23 DIAGNOSIS — Z87891 Personal history of nicotine dependence: Secondary | ICD-10-CM | POA: Diagnosis not present

## 2017-04-23 DIAGNOSIS — E119 Type 2 diabetes mellitus without complications: Secondary | ICD-10-CM | POA: Diagnosis not present

## 2017-04-23 DIAGNOSIS — Z430 Encounter for attention to tracheostomy: Secondary | ICD-10-CM | POA: Diagnosis not present

## 2017-04-23 DIAGNOSIS — J45909 Unspecified asthma, uncomplicated: Secondary | ICD-10-CM | POA: Diagnosis not present

## 2017-04-23 DIAGNOSIS — Z9981 Dependence on supplemental oxygen: Secondary | ICD-10-CM | POA: Diagnosis not present

## 2017-04-23 DIAGNOSIS — J386 Stenosis of larynx: Secondary | ICD-10-CM | POA: Diagnosis not present

## 2017-04-24 DIAGNOSIS — J386 Stenosis of larynx: Secondary | ICD-10-CM | POA: Diagnosis not present

## 2017-04-24 DIAGNOSIS — Z430 Encounter for attention to tracheostomy: Secondary | ICD-10-CM | POA: Diagnosis not present

## 2017-04-24 DIAGNOSIS — E119 Type 2 diabetes mellitus without complications: Secondary | ICD-10-CM | POA: Diagnosis not present

## 2017-04-24 DIAGNOSIS — J45909 Unspecified asthma, uncomplicated: Secondary | ICD-10-CM | POA: Diagnosis not present

## 2017-04-24 DIAGNOSIS — J9611 Chronic respiratory failure with hypoxia: Secondary | ICD-10-CM | POA: Diagnosis not present

## 2017-04-24 DIAGNOSIS — J398 Other specified diseases of upper respiratory tract: Secondary | ICD-10-CM | POA: Diagnosis not present

## 2017-04-27 DIAGNOSIS — J45909 Unspecified asthma, uncomplicated: Secondary | ICD-10-CM | POA: Diagnosis not present

## 2017-04-27 DIAGNOSIS — J398 Other specified diseases of upper respiratory tract: Secondary | ICD-10-CM | POA: Diagnosis not present

## 2017-04-27 DIAGNOSIS — Z430 Encounter for attention to tracheostomy: Secondary | ICD-10-CM | POA: Diagnosis not present

## 2017-04-27 DIAGNOSIS — J386 Stenosis of larynx: Secondary | ICD-10-CM | POA: Diagnosis not present

## 2017-04-27 DIAGNOSIS — E119 Type 2 diabetes mellitus without complications: Secondary | ICD-10-CM | POA: Diagnosis not present

## 2017-04-27 DIAGNOSIS — J9611 Chronic respiratory failure with hypoxia: Secondary | ICD-10-CM | POA: Diagnosis not present

## 2017-04-28 ENCOUNTER — Encounter: Payer: Self-pay | Admitting: Family Medicine

## 2017-04-28 ENCOUNTER — Ambulatory Visit: Payer: Medicare Other | Attending: Family Medicine | Admitting: Family Medicine

## 2017-04-28 VITALS — BP 140/95 | HR 93 | Temp 99.1°F | Resp 22 | Wt 216.6 lb

## 2017-04-28 DIAGNOSIS — Z930 Tracheostomy status: Secondary | ICD-10-CM | POA: Insufficient documentation

## 2017-04-28 DIAGNOSIS — J9611 Chronic respiratory failure with hypoxia: Secondary | ICD-10-CM

## 2017-04-28 DIAGNOSIS — F330 Major depressive disorder, recurrent, mild: Secondary | ICD-10-CM

## 2017-04-28 DIAGNOSIS — J398 Other specified diseases of upper respiratory tract: Secondary | ICD-10-CM | POA: Diagnosis not present

## 2017-04-28 DIAGNOSIS — Z79899 Other long term (current) drug therapy: Secondary | ICD-10-CM | POA: Diagnosis not present

## 2017-04-28 DIAGNOSIS — J4530 Mild persistent asthma, uncomplicated: Secondary | ICD-10-CM | POA: Diagnosis not present

## 2017-04-28 DIAGNOSIS — Z430 Encounter for attention to tracheostomy: Secondary | ICD-10-CM | POA: Diagnosis not present

## 2017-04-28 DIAGNOSIS — I10 Essential (primary) hypertension: Secondary | ICD-10-CM | POA: Diagnosis not present

## 2017-04-28 DIAGNOSIS — Z794 Long term (current) use of insulin: Secondary | ICD-10-CM | POA: Insufficient documentation

## 2017-04-28 DIAGNOSIS — J45909 Unspecified asthma, uncomplicated: Secondary | ICD-10-CM | POA: Diagnosis not present

## 2017-04-28 DIAGNOSIS — G43119 Migraine with aura, intractable, without status migrainosus: Secondary | ICD-10-CM

## 2017-04-28 DIAGNOSIS — E119 Type 2 diabetes mellitus without complications: Secondary | ICD-10-CM | POA: Diagnosis not present

## 2017-04-28 DIAGNOSIS — G8929 Other chronic pain: Secondary | ICD-10-CM | POA: Diagnosis not present

## 2017-04-28 DIAGNOSIS — E7800 Pure hypercholesterolemia, unspecified: Secondary | ICD-10-CM | POA: Diagnosis not present

## 2017-04-28 DIAGNOSIS — K219 Gastro-esophageal reflux disease without esophagitis: Secondary | ICD-10-CM | POA: Insufficient documentation

## 2017-04-28 DIAGNOSIS — E1100 Type 2 diabetes mellitus with hyperosmolarity without nonketotic hyperglycemic-hyperosmolar coma (NKHHC): Secondary | ICD-10-CM | POA: Insufficient documentation

## 2017-04-28 DIAGNOSIS — J386 Stenosis of larynx: Secondary | ICD-10-CM | POA: Diagnosis not present

## 2017-04-28 LAB — POCT GLYCOSYLATED HEMOGLOBIN (HGB A1C): HEMOGLOBIN A1C: 6.7

## 2017-04-28 LAB — POCT CBG (FASTING - GLUCOSE)-MANUAL ENTRY: GLUCOSE FASTING, POC: 175 mg/dL — AB (ref 70–99)

## 2017-04-28 MED ORDER — ATORVASTATIN CALCIUM 20 MG PO TABS: 20.0000 mg | ORAL_TABLET | Freq: Every day | ORAL | 5 refills | Status: DC

## 2017-04-28 MED ORDER — TOPIRAMATE 100 MG PO TABS: 100.0000 mg | ORAL_TABLET | Freq: Two times a day (BID) | ORAL | 5 refills | Status: DC

## 2017-04-28 MED ORDER — METHOCARBAMOL 500 MG PO TABS: 500.0000 mg | ORAL_TABLET | Freq: Three times a day (TID) | ORAL | 3 refills | Status: DC | PRN

## 2017-04-28 MED ORDER — CARVEDILOL 6.25 MG PO TABS: 6.2500 mg | ORAL_TABLET | Freq: Two times a day (BID) | ORAL | 5 refills | Status: DC

## 2017-04-28 MED ORDER — ACCU-CHEK AVIVA DEVI: 0 refills | Status: DC

## 2017-04-28 MED ORDER — ACCU-CHEK SOFTCLIX LANCET DEV MISC: 5 refills | Status: DC

## 2017-04-28 MED ORDER — SERTRALINE HCL 100 MG PO TABS: 100.0000 mg | ORAL_TABLET | Freq: Every day | ORAL | 5 refills | Status: DC

## 2017-04-28 MED ORDER — ALBUTEROL SULFATE (2.5 MG/3ML) 0.083% IN NEBU: 2.5000 mg | INHALATION_SOLUTION | Freq: Four times a day (QID) | RESPIRATORY_TRACT | 3 refills | Status: DC | PRN

## 2017-04-28 MED ORDER — FLUTICASONE-SALMETEROL 250-50 MCG/DOSE IN AEPB: 1.0000 | INHALATION_SPRAY | Freq: Two times a day (BID) | RESPIRATORY_TRACT | 3 refills | Status: DC

## 2017-04-28 MED ORDER — AMLODIPINE BESYLATE 10 MG PO TABS: 10.0000 mg | ORAL_TABLET | Freq: Every day | ORAL | 5 refills | Status: DC

## 2017-04-28 MED ORDER — GABAPENTIN 300 MG PO CAPS: 300.0000 mg | ORAL_CAPSULE | Freq: Three times a day (TID) | ORAL | 5 refills | Status: DC

## 2017-04-28 MED ORDER — ALBUTEROL SULFATE HFA 108 (90 BASE) MCG/ACT IN AERS: 2.0000 | INHALATION_SPRAY | Freq: Four times a day (QID) | RESPIRATORY_TRACT | 2 refills | Status: DC | PRN

## 2017-04-28 MED ORDER — GLUCOSE BLOOD VI STRP: ORAL_STRIP | 12 refills | Status: DC

## 2017-04-28 NOTE — Progress Notes (Signed)
Med RF New meter Follow up apt

## 2017-04-28 NOTE — Progress Notes (Signed)
Subjective:  Patient ID: [name], female    DOB: Sep 01, 1971  Age: 46 y.o. MRN: [id number]  CC: Follow-up visit  [name] is a 46 year old female with a history of type 2 diabetes mellitus (A1c 6.7 from today), hypertension, asthma, depression with recent complicated hospital course. She was admitted with hypoxic respiratory failure and likely developed intubation related tractional injury and laryngeal edema necessitating tracheostomy and subsequent discharge to long-term care facility but she is now at home. Accompanied by her husband who gives most of the history.  Recently seen by [facility] ENT, remains on 4 L of oxygen and decannulation is on hold at this time - noted from care everywhere reviewed.  She reports doing well and has no acute concerns today. Her migraines are controlled on Topamax. Blood pressure slightly elevated and she endorses compliance with her antihypertensives.  She denies worsening of anxiety or depression and denies suicidal ideations or intentions. She denies hypoglycemia or numbness in extremities.  She has upcoming appointments with her endocrinologist for management of diabetes and her pulmonologist for management of asthma.  Past Medical History:  Diagnosis Date  . Arthritis   . Asthma   . Chronic back pain   . Chronic chest pain   . Depression   . DM (diabetes mellitus) ([place])    INSULIN DEPENDENT  . GERD (gastroesophageal reflux disease)   . Headache(784.0)   . Hypertension   . Hypokalemia     Past Surgical History:  Procedure Laterality Date  . APPENDECTOMY    . CESAREAN SECTION     x 3  . CHOLECYSTECTOMY N/A 03/02/2014   Procedure: LAPAROSCOPIC CHOLECYSTECTOMY;  Surgeon: Joyice Faster. Cornett, MD;  Location: [place];  Service: General;  Laterality: N/A;  . HERNIA REPAIR    . PANENDOSCOPY N/A 03/04/2017   Procedure: PANENDOSCOPY WITH POSSIBLE FOREIGN BODY REMOVAL;  Surgeon: Jodi Marble, MD;  Location: [place];  Service: ENT;  Laterality: N/A;  . ROTATOR CUFF REPAIR    . VESICOVAGINAL FISTULA CLOSURE W/ TAH  2009    Allergies  Allergen Reactions  . Reglan [Metoclopramide] Other (See Comments)    Panic attack     Outpatient Medications Prior to Visit  Medication Sig Dispense Refill  . chlorhexidine gluconate, MEDLINE KIT, (PERIDEX) 0.12 % solution 15 mLs by Mouth Rinse route 2 (two) times daily. 120 mL 0  . olopatadine (PATANOL) 0.1 % ophthalmic solution Place 1 drop into the right eye 2 (two) times daily. 5 mL 0  . albuterol (PROVENTIL) (2.5 MG/3ML) 0.083% nebulizer solution Take 3 mLs (2.5 mg total) by nebulization every 6 (six) hours as needed for wheezing or shortness of breath. Reported on 02/22/2016 75 mL 3  . pantoprazole (PROTONIX) 40 MG injection Inject 40 mg into the vein daily. 1 each   . sertraline (ZOLOFT) 50 MG tablet Place 1 tablet (50 mg total) into feeding tube daily.    Marland Kitchen arformoterol (BROVANA) 15 MCG/2ML NEBU Take 2 mLs (15 mcg total) by nebulization 2 (two) times daily. (Patient not taking: Reported on 04/28/2017) 120 mL 0  . budesonide (PULMICORT) 0.5 MG/2ML nebulizer solution Take 2 mLs (0.5 mg total) by nebulization 2 (two) times daily. (Patient not taking: Reported on 04/28/2017) 2 mL 12  . mouth rinse LIQD solution 15 mLs by Mouth Rinse route QID. (Patient not taking: Reported on 04/28/2017)  0  . Nutritional Supplements (FEEDING SUPPLEMENT, VITAL HIGH PROTEIN,) LIQD liquid Place 1,000 mLs into feeding tube  daily. (Patient not taking: Reported on 04/28/2017)    . enoxaparin (LOVENOX) 40 MG/0.4ML injection Inject 0.4 mLs (40 mg total) into the skin daily. (Patient not taking: Reported on 04/28/2017) 0 Syringe   . fentaNYL (SUBLIMAZE) 100 MCG/2ML injection Inject 2 mLs (100 mcg total) into the vein every 15 (fifteen) minutes as needed (to achieve RASS goal.). 2 mL 0  . fentaNYL (SUBLIMAZE) 100 MCG/2ML injection Inject 2 mLs (100 mcg total) into the vein every  2 (two) hours as needed (to maintain RASS goal.). 2 mL 0  . fentaNYL (SUBLIMAZE) 100 MCG/2ML injection Inject 0.5-1 mLs (25-50 mcg total) into the vein every 5 (five) minutes as needed ((up to 150 mcg) for pain score greater than 4). 2 mL 0  . insulin aspart (NOVOLOG) 100 UNIT/ML injection Inject 0-20 Units into the skin every 4 (four) hours. (Patient not taking: Reported on 04/28/2017) 10 mL 11  . propofol (DIPRIVAN) 1000 MG/100ML EMUL injection Inject 0-5,285 mcg/min into the vein continuous.     No facility-administered medications prior to visit.     ROS Review of Systems  Constitutional: Negative for activity change, appetite change and fatigue.  HENT: Negative for congestion, sinus pressure and sore throat.   Eyes: Negative for visual disturbance.  Respiratory: Negative for cough, chest tightness, shortness of breath and wheezing.   Cardiovascular: Negative for chest pain and palpitations.  Gastrointestinal: Negative for abdominal distention, abdominal pain and constipation.  Endocrine: Negative for polydipsia.  Genitourinary: Negative for dysuria and frequency.  Musculoskeletal: Negative for arthralgias and back pain.  Skin: Negative for rash.  Neurological: Negative for tremors, light-headedness and numbness.  Hematological: Does not bruise/bleed easily.  Psychiatric/Behavioral: Negative for agitation and behavioral problems.    Objective:  BP (!) 140/95 (BP Location: Left Arm, Patient Position: Sitting)   Pulse 93   Temp 99.1 F (37.3 C) (Oral)   Resp (!) 22   Wt 216 lb 9.6 oz (98.2 kg)   SpO2 100%   PF (!) 4 L/min   BMI 40.93 kg/m   BP/Weight 04/28/2017 03/06/2017 11/01/4707  Systolic BP 628 366 -  Diastolic BP 95 76 -  Wt. (Lbs) 216.6 - 233.69  BMI 40.93 - -      Physical Exam  Constitutional: She is oriented to person, place, and time. She appears well-developed and well-nourished.  Neck:  Tracheostomy in place - on 4 L oxygen  Cardiovascular: Normal rate,  normal heart sounds and intact distal pulses.   No murmur heard. Pulmonary/Chest: Effort normal and breath sounds normal. She has no wheezes. She has no rales. She exhibits no tenderness.  Abdominal: Soft. Bowel sounds are normal. She exhibits no distension and no mass. There is no tenderness.  Musculoskeletal: Normal range of motion.  Neurological: She is alert and oriented to person, place, and time.  Skin: Skin is warm and dry.  Psychiatric: She has a normal mood and affect.     Assessment & Plan:   1. Type 2 diabetes mellitus with hyperosmolarity without coma, without long-term current use of insulin (HCC) A1c 6.7 Continue medications as per Endocrine - Glucose (CBG), Fasting - HgB A1c - glucose blood (ACCU-CHEK AVIVA) test strip; Use as instructed daily  Dispense: 100 each; Refill: 12 - Lancet Devices (ACCU-CHEK SOFTCLIX) lancets; Use as instructed daily.  Dispense: 1 each; Refill: 5  2. Stable asthma, mild persistent No exacerbation Provided rx for Advair, proventil Keep upcoming appointment with Pulmonary - albuterol (PROVENTIL) (2.5 MG/3ML) 0.083% nebulizer solution; Take 3  mLs (2.5 mg total) by nebulization every 6 (six) hours as needed for wheezing or shortness of breath. Reported on 02/22/2016  Dispense: 75 mL; Refill: 3 - Fluticasone-Salmeterol (ADVAIR DISKUS) 250-50 MCG/DOSE AEPB; Inhale 1 puff into the lungs 2 (two) times daily.  Dispense: 60 each; Refill: 3 - albuterol (PROVENTIL HFA;VENTOLIN HFA) 108 (90 Base) MCG/ACT inhaler; Inhale 2 puffs into the lungs every 6 (six) hours as needed for wheezing or shortness of breath.  Dispense: 1 Inhaler; Refill: 2  3. Chronic respiratory failure with hypoxia (HCC) Remains on 4L oxygen  4. Intractable migraine with aura without status migrainosus Controlled - topiramate (TOPAMAX) 100 MG tablet; Take 1 tablet (100 mg total) by mouth 2 (two) times daily.  Dispense: 60 tablet; Refill: 5  5. Pure hypercholesterolemia Controlled -  atorvastatin (LIPITOR) 20 MG tablet; Take 1 tablet (20 mg total) by mouth daily.  Dispense: 30 tablet; Refill: 5  6. Benign essential HTN Uncontrolled Increase dose of Amlodipine Low sodium diet - carvedilol (COREG) 6.25 MG tablet; Take 1 tablet (6.25 mg total) by mouth 2 (two) times daily with a meal.  Dispense: 60 tablet; Refill: 5 - amLODipine (NORVASC) 10 MG tablet; Take 1 tablet (10 mg total) by mouth daily.  Dispense: 30 tablet; Refill: 5  7. Tracheostomy status ([place]) Followed by ENT  8. Mild episode of recurrent major depressive disorder (HCC) Controlled - sertraline (ZOLOFT) 100 MG tablet; Place 1 tablet (100 mg total) into feeding tube daily.  Dispense: 30 tablet; Refill: 5   Meds ordered this encounter  Medications  . methocarbamol (ROBAXIN) 500 MG tablet    Sig: Take 1 tablet (500 mg total) by mouth every 8 (eight) hours as needed for muscle spasms.    Dispense:  90 tablet    Refill:  3  . topiramate (TOPAMAX) 100 MG tablet    Sig: Take 1 tablet (100 mg total) by mouth 2 (two) times daily.    Dispense:  60 tablet    Refill:  5  . carvedilol (COREG) 6.25 MG tablet    Sig: Take 1 tablet (6.25 mg total) by mouth 2 (two) times daily with a meal.    Dispense:  60 tablet    Refill:  5  . amLODipine (NORVASC) 10 MG tablet    Sig: Take 1 tablet (10 mg total) by mouth daily.    Dispense:  30 tablet    Refill:  5  . atorvastatin (LIPITOR) 20 MG tablet    Sig: Take 1 tablet (20 mg total) by mouth daily.    Dispense:  30 tablet    Refill:  5  . sertraline (ZOLOFT) 100 MG tablet    Sig: Place 1 tablet (100 mg total) into feeding tube daily.    Dispense:  30 tablet    Refill:  5  . gabapentin (NEURONTIN) 300 MG capsule    Sig: Take 1 capsule (300 mg total) by mouth 3 (three) times daily.    Dispense:  90 capsule    Refill:  5  . albuterol (PROVENTIL) (2.5 MG/3ML) 0.083% nebulizer solution    Sig: Take 3 mLs (2.5 mg total) by nebulization every 6 (six) hours as needed for  wheezing or shortness of breath. Reported on 02/22/2016    Dispense:  75 mL    Refill:  3  . Fluticasone-Salmeterol (ADVAIR DISKUS) 250-50 MCG/DOSE AEPB    Sig: Inhale 1 puff into the lungs 2 (two) times daily.    Dispense:  60 each  Refill:  3  . albuterol (PROVENTIL HFA;VENTOLIN HFA) 108 (90 Base) MCG/ACT inhaler    Sig: Inhale 2 puffs into the lungs every 6 (six) hours as needed for wheezing or shortness of breath.    Dispense:  1 Inhaler    Refill:  2  . glucose blood (ACCU-CHEK AVIVA) test strip    Sig: Use as instructed daily    Dispense:  100 each    Refill:  12  . Blood Glucose Monitoring Suppl (ACCU-CHEK AVIVA) device    Sig: Use as instructed daily.    Dispense:  1 each    Refill:  0  . Lancet Devices (ACCU-CHEK SOFTCLIX) lancets    Sig: Use as instructed daily.    Dispense:  1 each    Refill:  5    Follow-up: Return in about 3 months (around 07/29/2017) for Follow-up on diabetes mellitus.   This note has been created with Surveyor, quantity. Any transcriptional errors are unintentional.     Arnoldo Morale MD

## 2017-04-29 DIAGNOSIS — Z430 Encounter for attention to tracheostomy: Secondary | ICD-10-CM | POA: Diagnosis not present

## 2017-04-29 DIAGNOSIS — J45909 Unspecified asthma, uncomplicated: Secondary | ICD-10-CM | POA: Diagnosis not present

## 2017-04-29 DIAGNOSIS — J386 Stenosis of larynx: Secondary | ICD-10-CM | POA: Diagnosis not present

## 2017-04-29 DIAGNOSIS — E119 Type 2 diabetes mellitus without complications: Secondary | ICD-10-CM | POA: Diagnosis not present

## 2017-04-29 DIAGNOSIS — J398 Other specified diseases of upper respiratory tract: Secondary | ICD-10-CM | POA: Diagnosis not present

## 2017-04-29 DIAGNOSIS — J9611 Chronic respiratory failure with hypoxia: Secondary | ICD-10-CM | POA: Diagnosis not present

## 2017-04-30 ENCOUNTER — Encounter: Payer: Self-pay | Admitting: Family Medicine

## 2017-04-30 ENCOUNTER — Encounter: Payer: Self-pay | Admitting: Pharmacist

## 2017-04-30 DIAGNOSIS — Z430 Encounter for attention to tracheostomy: Secondary | ICD-10-CM | POA: Diagnosis not present

## 2017-04-30 DIAGNOSIS — J9611 Chronic respiratory failure with hypoxia: Secondary | ICD-10-CM | POA: Diagnosis not present

## 2017-04-30 DIAGNOSIS — E119 Type 2 diabetes mellitus without complications: Secondary | ICD-10-CM | POA: Diagnosis not present

## 2017-04-30 DIAGNOSIS — J386 Stenosis of larynx: Secondary | ICD-10-CM | POA: Diagnosis not present

## 2017-04-30 DIAGNOSIS — J45909 Unspecified asthma, uncomplicated: Secondary | ICD-10-CM | POA: Diagnosis not present

## 2017-04-30 DIAGNOSIS — J398 Other specified diseases of upper respiratory tract: Secondary | ICD-10-CM | POA: Diagnosis not present

## 2017-04-30 NOTE — Progress Notes (Signed)
Received prior authorization request from [facility] ([place]) for methocarbamol. Tizanidine and baclofen are preferred. Will place fax in Dr. Johna Sheriff box to see if switch is ok or if patient cannot take preferred medications.

## 2017-05-04 DIAGNOSIS — J45909 Unspecified asthma, uncomplicated: Secondary | ICD-10-CM | POA: Diagnosis not present

## 2017-05-04 DIAGNOSIS — J398 Other specified diseases of upper respiratory tract: Secondary | ICD-10-CM | POA: Diagnosis not present

## 2017-05-04 DIAGNOSIS — Z430 Encounter for attention to tracheostomy: Secondary | ICD-10-CM | POA: Diagnosis not present

## 2017-05-04 DIAGNOSIS — J9611 Chronic respiratory failure with hypoxia: Secondary | ICD-10-CM | POA: Diagnosis not present

## 2017-05-04 DIAGNOSIS — J386 Stenosis of larynx: Secondary | ICD-10-CM | POA: Diagnosis not present

## 2017-05-04 DIAGNOSIS — E119 Type 2 diabetes mellitus without complications: Secondary | ICD-10-CM | POA: Diagnosis not present

## 2017-05-05 ENCOUNTER — Ambulatory Visit: Payer: Medicare Other | Admitting: Endocrinology

## 2017-05-06 ENCOUNTER — Observation Stay (HOSPITAL_COMMUNITY): Payer: Medicare Other

## 2017-05-06 ENCOUNTER — Inpatient Hospital Stay (HOSPITAL_COMMUNITY)
Admission: EM | Admit: 2017-05-06 | Discharge: 2017-05-08 | DRG: 189 | Disposition: A | Discharge status: Home / Self Care | Payer: Medicare Other | Attending: Internal Medicine | Admitting: Internal Medicine

## 2017-05-06 ENCOUNTER — Encounter (HOSPITAL_COMMUNITY): Payer: Self-pay | Admitting: Family Medicine

## 2017-05-06 ENCOUNTER — Other Ambulatory Visit: Payer: Self-pay

## 2017-05-06 ENCOUNTER — Emergency Department (HOSPITAL_COMMUNITY): Payer: Medicare Other

## 2017-05-06 DIAGNOSIS — Z430 Encounter for attention to tracheostomy: Secondary | ICD-10-CM | POA: Diagnosis not present

## 2017-05-06 DIAGNOSIS — D649 Anemia, unspecified: Secondary | ICD-10-CM | POA: Diagnosis not present

## 2017-05-06 DIAGNOSIS — Z833 Family history of diabetes mellitus: Secondary | ICD-10-CM

## 2017-05-06 DIAGNOSIS — G4733 Obstructive sleep apnea (adult) (pediatric): Secondary | ICD-10-CM | POA: Diagnosis present

## 2017-05-06 DIAGNOSIS — M549 Dorsalgia, unspecified: Secondary | ICD-10-CM | POA: Diagnosis present

## 2017-05-06 DIAGNOSIS — R058 Other specified cough: Secondary | ICD-10-CM | POA: Diagnosis present

## 2017-05-06 DIAGNOSIS — J45909 Unspecified asthma, uncomplicated: Secondary | ICD-10-CM | POA: Diagnosis not present

## 2017-05-06 DIAGNOSIS — J45901 Unspecified asthma with (acute) exacerbation: Secondary | ICD-10-CM | POA: Diagnosis not present

## 2017-05-06 DIAGNOSIS — Z888 Allergy status to other drugs, medicaments and biological substances status: Secondary | ICD-10-CM

## 2017-05-06 DIAGNOSIS — Z8261 Family history of arthritis: Secondary | ICD-10-CM

## 2017-05-06 DIAGNOSIS — E876 Hypokalemia: Secondary | ICD-10-CM | POA: Diagnosis not present

## 2017-05-06 DIAGNOSIS — Z9119 Patient's noncompliance with other medical treatment and regimen: Secondary | ICD-10-CM

## 2017-05-06 DIAGNOSIS — J40 Bronchitis, not specified as acute or chronic: Secondary | ICD-10-CM | POA: Diagnosis present

## 2017-05-06 DIAGNOSIS — J4551 Severe persistent asthma with (acute) exacerbation: Secondary | ICD-10-CM

## 2017-05-06 DIAGNOSIS — E1142 Type 2 diabetes mellitus with diabetic polyneuropathy: Secondary | ICD-10-CM

## 2017-05-06 DIAGNOSIS — K219 Gastro-esophageal reflux disease without esophagitis: Secondary | ICD-10-CM | POA: Diagnosis present

## 2017-05-06 DIAGNOSIS — E119 Type 2 diabetes mellitus without complications: Secondary | ICD-10-CM | POA: Diagnosis not present

## 2017-05-06 DIAGNOSIS — G894 Chronic pain syndrome: Secondary | ICD-10-CM | POA: Diagnosis present

## 2017-05-06 DIAGNOSIS — Z79899 Other long term (current) drug therapy: Secondary | ICD-10-CM

## 2017-05-06 DIAGNOSIS — I10 Essential (primary) hypertension: Secondary | ICD-10-CM | POA: Diagnosis present

## 2017-05-06 DIAGNOSIS — Z8249 Family history of ischemic heart disease and other diseases of the circulatory system: Secondary | ICD-10-CM

## 2017-05-06 DIAGNOSIS — G8929 Other chronic pain: Secondary | ICD-10-CM | POA: Diagnosis present

## 2017-05-06 DIAGNOSIS — Z87891 Personal history of nicotine dependence: Secondary | ICD-10-CM

## 2017-05-06 DIAGNOSIS — Z9049 Acquired absence of other specified parts of digestive tract: Secondary | ICD-10-CM

## 2017-05-06 DIAGNOSIS — E872 Acidosis: Secondary | ICD-10-CM | POA: Diagnosis not present

## 2017-05-06 DIAGNOSIS — R0602 Shortness of breath: Secondary | ICD-10-CM | POA: Diagnosis not present

## 2017-05-06 DIAGNOSIS — J9621 Acute and chronic respiratory failure with hypoxia: Principal | ICD-10-CM | POA: Diagnosis present

## 2017-05-06 DIAGNOSIS — M1990 Unspecified osteoarthritis, unspecified site: Secondary | ICD-10-CM | POA: Diagnosis present

## 2017-05-06 DIAGNOSIS — J398 Other specified diseases of upper respiratory tract: Secondary | ICD-10-CM | POA: Diagnosis not present

## 2017-05-06 DIAGNOSIS — Z794 Long term (current) use of insulin: Secondary | ICD-10-CM

## 2017-05-06 DIAGNOSIS — Z930 Tracheostomy status: Secondary | ICD-10-CM | POA: Diagnosis not present

## 2017-05-06 DIAGNOSIS — J386 Stenosis of larynx: Secondary | ICD-10-CM | POA: Diagnosis not present

## 2017-05-06 DIAGNOSIS — R05 Cough: Secondary | ICD-10-CM | POA: Diagnosis not present

## 2017-05-06 DIAGNOSIS — A419 Sepsis, unspecified organism: Secondary | ICD-10-CM

## 2017-05-06 DIAGNOSIS — J9611 Chronic respiratory failure with hypoxia: Secondary | ICD-10-CM | POA: Diagnosis not present

## 2017-05-06 DIAGNOSIS — J370 Chronic laryngitis: Secondary | ICD-10-CM | POA: Diagnosis not present

## 2017-05-06 DIAGNOSIS — Z823 Family history of stroke: Secondary | ICD-10-CM

## 2017-05-06 LAB — I-STAT CG4 LACTIC ACID, ED: LACTIC ACID, VENOUS: 3.94 mmol/L — AB (ref 0.5–1.9)

## 2017-05-06 LAB — CBC WITH DIFFERENTIAL/PLATELET
BASOS ABS: 0 10*3/uL (ref 0.0–0.1)
Basophils Relative: 0 %
Eosinophils Absolute: 0.1 10*3/uL (ref 0.0–0.7)
Eosinophils Relative: 1 %
HEMATOCRIT: 33.1 % — AB (ref 36.0–46.0)
HEMOGLOBIN: 10.7 g/dL — AB (ref 12.0–15.0)
LYMPHS PCT: 51 %
Lymphs Abs: 5.9 10*3/uL — ABNORMAL HIGH (ref 0.7–4.0)
MCH: 27.1 pg (ref 26.0–34.0)
MCHC: 32.3 g/dL (ref 30.0–36.0)
MCV: 83.8 fL (ref 78.0–100.0)
Monocytes Absolute: 0.6 10*3/uL (ref 0.1–1.0)
Monocytes Relative: 6 %
NEUTROS ABS: 4.8 10*3/uL (ref 1.7–7.7)
NEUTROS PCT: 42 %
Platelets: 396 10*3/uL (ref 150–400)
RBC: 3.95 MIL/uL (ref 3.87–5.11)
RDW: 14.1 % (ref 11.5–15.5)
WBC: 11.4 10*3/uL — ABNORMAL HIGH (ref 4.0–10.5)

## 2017-05-06 LAB — COMPREHENSIVE METABOLIC PANEL
ALBUMIN: 3.6 g/dL (ref 3.5–5.0)
ALT: 24 U/L (ref 14–54)
ANION GAP: 12 (ref 5–15)
AST: 26 U/L (ref 15–41)
Alkaline Phosphatase: 106 U/L (ref 38–126)
BILIRUBIN TOTAL: 0.4 mg/dL (ref 0.3–1.2)
BUN: 8 mg/dL (ref 6–20)
CALCIUM: 9 mg/dL (ref 8.9–10.3)
CO2: 20 mmol/L — ABNORMAL LOW (ref 22–32)
Chloride: 107 mmol/L (ref 101–111)
Creatinine, Ser: 1.09 mg/dL — ABNORMAL HIGH (ref 0.44–1.00)
GFR calc non Af Amer: 59 mL/min — ABNORMAL LOW (ref >60)
Glucose, Bld: 274 mg/dL — ABNORMAL HIGH (ref 65–99)
POTASSIUM: 3.3 mmol/L — AB (ref 3.5–5.1)
SODIUM: 139 mmol/L (ref 135–145)
TOTAL PROTEIN: 7.5 g/dL (ref 6.5–8.1)

## 2017-05-06 LAB — I-STAT TROPONIN, ED: Troponin i, poc: 0 ng/mL (ref 0.00–0.08)

## 2017-05-06 LAB — LACTIC ACID, PLASMA: LACTIC ACID, VENOUS: 3.1 mmol/L — AB (ref 0.5–1.9)

## 2017-05-06 LAB — MAGNESIUM: MAGNESIUM: 1.4 mg/dL — AB (ref 1.7–2.4)

## 2017-05-06 MED ORDER — INSULIN ASPART 100 UNIT/ML ~~LOC~~ SOLN
0.0000 [IU] | Freq: Every day | SUBCUTANEOUS | Status: DC
  Administered 2017-05-07: 4 [IU] via SUBCUTANEOUS
  Administered 2017-05-07: 5 [IU] via SUBCUTANEOUS
  Filled 2017-05-06: qty 1

## 2017-05-06 MED ORDER — PANTOPRAZOLE SODIUM 40 MG PO TBEC
40.0000 mg | DELAYED_RELEASE_TABLET | Freq: Every day | ORAL | Status: DC
  Administered 2017-05-07 – 2017-05-08 (×2): 40 mg via ORAL
  Filled 2017-05-06 (×2): qty 1

## 2017-05-06 MED ORDER — ONDANSETRON HCL 4 MG PO TABS
4.0000 mg | ORAL_TABLET | Freq: Four times a day (QID) | ORAL | Status: DC | PRN
Start: 2017-05-06 — End: 2017-05-08

## 2017-05-06 MED ORDER — ACETAMINOPHEN 325 MG PO TABS
650.0000 mg | ORAL_TABLET | Freq: Four times a day (QID) | ORAL | Status: DC | PRN
  Administered 2017-05-07: 650 mg via ORAL
  Filled 2017-05-06: qty 2

## 2017-05-06 MED ORDER — METHOCARBAMOL 500 MG PO TABS
500.0000 mg | ORAL_TABLET | Freq: Three times a day (TID) | ORAL | Status: DC | PRN
  Administered 2017-05-07: 500 mg via ORAL
  Filled 2017-05-06: qty 1

## 2017-05-06 MED ORDER — ALBUTEROL SULFATE (2.5 MG/3ML) 0.083% IN NEBU
2.5000 mg | INHALATION_SOLUTION | RESPIRATORY_TRACT | Status: DC | PRN
  Administered 2017-05-07: 2.5 mg via RESPIRATORY_TRACT

## 2017-05-06 MED ORDER — GABAPENTIN 300 MG PO CAPS
300.0000 mg | ORAL_CAPSULE | Freq: Three times a day (TID) | ORAL | Status: DC
  Administered 2017-05-07 – 2017-05-08 (×5): 300 mg via ORAL
  Filled 2017-05-06 (×5): qty 1

## 2017-05-06 MED ORDER — ACETAMINOPHEN 650 MG RE SUPP: 650.0000 mg | Freq: Four times a day (QID) | RECTAL | Status: DC | PRN

## 2017-05-06 MED ORDER — METHYLPREDNISOLONE SODIUM SUCC 40 MG IJ SOLR
40.0000 mg | Freq: Every day | INTRAMUSCULAR | Status: DC
  Administered 2017-05-06 – 2017-05-07 (×2): 40 mg via INTRAVENOUS
  Filled 2017-05-06 (×2): qty 1

## 2017-05-06 MED ORDER — FENTANYL CITRATE (PF) 100 MCG/2ML IJ SOLN
50.0000 ug | Freq: Once | INTRAMUSCULAR | Status: AC
  Administered 2017-05-06: 50 ug via INTRAVENOUS
  Filled 2017-05-06: qty 2

## 2017-05-06 MED ORDER — DEXTROSE 5 % IV SOLN
1.0000 g | Freq: Three times a day (TID) | INTRAVENOUS | Status: DC
  Administered 2017-05-06: 1 g via INTRAVENOUS
  Filled 2017-05-06 (×2): qty 1

## 2017-05-06 MED ORDER — IPRATROPIUM-ALBUTEROL 0.5-2.5 (3) MG/3ML IN SOLN
3.0000 mL | Freq: Once | RESPIRATORY_TRACT | Status: AC
  Administered 2017-05-06: 3 mL via RESPIRATORY_TRACT
  Filled 2017-05-06: qty 3

## 2017-05-06 MED ORDER — FLUTICASONE FUROATE-VILANTEROL 200-25 MCG/INH IN AEPB
1.0000 | INHALATION_SPRAY | Freq: Every day | RESPIRATORY_TRACT | Status: DC
  Filled 2017-05-06: qty 28

## 2017-05-06 MED ORDER — ALBUTEROL SULFATE (2.5 MG/3ML) 0.083% IN NEBU
2.5000 mg | INHALATION_SOLUTION | Freq: Four times a day (QID) | RESPIRATORY_TRACT | Status: DC
  Administered 2017-05-06 – 2017-05-08 (×6): 2.5 mg via RESPIRATORY_TRACT
  Filled 2017-05-06 (×7): qty 3

## 2017-05-06 MED ORDER — SERTRALINE HCL 100 MG PO TABS
100.0000 mg | ORAL_TABLET | Freq: Every day | ORAL | Status: DC
  Administered 2017-05-07 – 2017-05-08 (×2): 100 mg
  Filled 2017-05-06 (×2): qty 1

## 2017-05-06 MED ORDER — INSULIN ASPART 100 UNIT/ML ~~LOC~~ SOLN
0.0000 [IU] | Freq: Three times a day (TID) | SUBCUTANEOUS | Status: DC
  Administered 2017-05-07: 9 [IU] via SUBCUTANEOUS
  Administered 2017-05-07: 7 [IU] via SUBCUTANEOUS
  Administered 2017-05-07: 5 [IU] via SUBCUTANEOUS
  Administered 2017-05-08: 2 [IU] via SUBCUTANEOUS
  Administered 2017-05-08: 3 [IU] via SUBCUTANEOUS

## 2017-05-06 MED ORDER — SODIUM CHLORIDE 0.9 % IV BOLUS (SEPSIS)
1000.0000 mL | Freq: Once | INTRAVENOUS | Status: AC
  Administered 2017-05-06: 1000 mL via INTRAVENOUS

## 2017-05-06 MED ORDER — IOPAMIDOL (ISOVUE-370) INJECTION 76%
INTRAVENOUS | Status: AC
  Administered 2017-05-06: 100 mL
  Filled 2017-05-06: qty 100

## 2017-05-06 MED ORDER — ENOXAPARIN SODIUM 40 MG/0.4ML ~~LOC~~ SOLN
40.0000 mg | SUBCUTANEOUS | Status: DC
  Administered 2017-05-07 – 2017-05-08 (×2): 40 mg via SUBCUTANEOUS
  Filled 2017-05-06: qty 0.4

## 2017-05-06 MED ORDER — DEXTROSE 5 % IV SOLN
1.0000 g | Freq: Two times a day (BID) | INTRAVENOUS | Status: DC
  Administered 2017-05-06: 1 g via INTRAVENOUS
  Filled 2017-05-06: qty 1

## 2017-05-06 MED ORDER — POTASSIUM CHLORIDE IN NACL 40-0.9 MEQ/L-% IV SOLN
INTRAVENOUS | Status: AC
  Administered 2017-05-07: 100 mL/h via INTRAVENOUS
  Filled 2017-05-06: qty 1000

## 2017-05-06 MED ORDER — TOPIRAMATE 100 MG PO TABS
100.0000 mg | ORAL_TABLET | Freq: Two times a day (BID) | ORAL | Status: DC
  Administered 2017-05-07 – 2017-05-08 (×4): 100 mg via ORAL
  Filled 2017-05-06 (×3): qty 1
  Filled 2017-05-06: qty 4

## 2017-05-06 MED ORDER — DEXTROSE 5 % IV SOLN
500.0000 mg | INTRAVENOUS | Status: DC
  Administered 2017-05-07: 500 mg via INTRAVENOUS
  Filled 2017-05-06: qty 500

## 2017-05-06 MED ORDER — DEXTROSE 5 % IV SOLN
1.0000 g | Freq: Three times a day (TID) | INTRAVENOUS | Status: DC
  Filled 2017-05-06 (×3): qty 1

## 2017-05-06 MED ORDER — MORPHINE SULFATE (PF) 4 MG/ML IV SOLN
2.0000 mg | INTRAVENOUS | Status: DC | PRN
  Administered 2017-05-06 – 2017-05-08 (×9): 4 mg via INTRAVENOUS
  Filled 2017-05-06 (×9): qty 1

## 2017-05-06 MED ORDER — CARVEDILOL 6.25 MG PO TABS
6.2500 mg | ORAL_TABLET | Freq: Two times a day (BID) | ORAL | Status: DC
  Administered 2017-05-07 – 2017-05-08 (×3): 6.25 mg via ORAL
  Filled 2017-05-06 (×3): qty 1

## 2017-05-06 MED ORDER — ATORVASTATIN CALCIUM 20 MG PO TABS
20.0000 mg | ORAL_TABLET | Freq: Every day | ORAL | Status: DC
Start: 2017-05-07 — End: 2017-05-08
  Administered 2017-05-07 – 2017-05-08 (×2): 20 mg via ORAL
  Filled 2017-05-06 (×2): qty 1

## 2017-05-06 MED ORDER — VANCOMYCIN HCL 10 G IV SOLR
1250.0000 mg | Freq: Two times a day (BID) | INTRAVENOUS | Status: DC
  Administered 2017-05-06: 1250 mg via INTRAVENOUS
  Filled 2017-05-06 (×3): qty 1250

## 2017-05-06 MED ORDER — ONDANSETRON HCL 4 MG/2ML IJ SOLN: 4.0000 mg | Freq: Four times a day (QID) | INTRAMUSCULAR | Status: DC | PRN

## 2017-05-06 NOTE — Progress Notes (Signed)
Pharmacy Antibiotic Note  [name] is a 46 y.o. female admitted on 05/06/2017 with pneumonia.  Pharmacy has been consulted for vancomycin and cefepime dosing.  Renal function stable with CrCl ~ 70mL/min.     Plan: Cefepime 1g IV q 8h Vancomycin 1250mg  IV q 12h with goal trough 15-20 Monitor clinical progression, kidney function, vancomycin level as needed     Temp (24hrs), Avg:98.9 F (37.2 C), Min:98.9 F (37.2 C), Max:98.9 F (37.2 C)   Recent Labs Lab 05/06/17 1706 05/06/17 1719  WBC 11.4*  --   LATICACIDVEN  --  3.94*    CrCl cannot be calculated (Patient's most recent lab result is older than the maximum 21 days allowed.).    Allergies  Allergen Reactions  . Reglan [Metoclopramide] Other (See Comments)    Panic attack    Antimicrobials this admission: Vanc 8/15 >>  Cefepime 8/15 >>  Dose adjustments this admission: n/a  Microbiology results: 8/15 BCx: sent 8/15 UCx: sent    Bertis Ruddy, PharmD Pharmacy Resident Pager #: [phone] 05/06/2017 5:55 PM

## 2017-05-06 NOTE — ED Notes (Signed)
EDP at bedside  

## 2017-05-06 NOTE — ED Notes (Signed)
Lactic acid result 3.94 given to Dr. Lacinda Axon

## 2017-05-06 NOTE — ED Provider Notes (Signed)
[place] DEPT Provider Note   CSN: [id number] Arrival date & time: 05/06/17  [place]     History   Chief Complaint Chief Complaint  Patient presents with  . Shortness of Breath    [name] is a 46 y.o. female.  Level V caveat for urgent need for intervention.  Patient became dyspneic 2 days ago with productive cough and bloody sputum. No fevers, sweats, chills. She has a tracheostomy tube.  She had a prolonged admission on 02/18/17 with an asthmatic exacerbation and intubation and a prolonged ICU stay. She was then transferred to [facility] and then [facility]. She required tracheostomy at this time.She has multiple other health problems well documented in the past medical history.      Past Medical History:  Diagnosis Date  . Arthritis   . Asthma   . Chronic back pain   . Chronic chest pain   . Depression   . DM (diabetes mellitus) ([place])    INSULIN DEPENDENT  . GERD (gastroesophageal reflux disease)   . Headache(784.0)   . Hypertension   . Hypokalemia     Patient Active Problem List   Diagnosis Date Noted  . Tracheostomy status ([place]) 04/28/2017  . Tracheal stenosis 03/04/2017  . Acute blood loss anemia   . Type 2 diabetes mellitus with peripheral neuropathy (HCC)   . Generalized anxiety disorder   . SOB (shortness of breath)   . Hemoptysis   . Hypoalbuminemia due to protein-calorie malnutrition ([place])   . Benign essential HTN   . HCAP (healthcare-associated pneumonia)   . Severe asthma with acute exacerbation   . Critical illness myopathy 02/27/2017  . Acute on chronic respiratory failure with hypoxemia ([place])   . AKI (acute kidney injury) ([place])   . Diabetes mellitus type 2 in obese ([place])   . Morbid obesity ([place])   . Chronic pain syndrome   . Leukocytosis   . Critical illness polyneuropathy ([place])   . Acute renal failure ([place])   . Gastritis and gastroduodenitis 02/22/2016  . Hyperlipidemia 02/22/2016  . Depression 02/22/2016    . Migraine 01/30/2015  . Headache 01/30/2015  . URI (upper respiratory infection) 06/04/2014  . Atypical chest pain 02/26/2014  . DM type 2 (diabetes mellitus, type 2) ([place]) 02/26/2014  . Stable asthma 02/26/2014  . Chest pain 11/10/2013  . Hypertensive urgency 11/10/2013  . Back pain 09/05/2013  . Essential hypertension 09/05/2013  . Acute bronchitis 05/01/2013  . Asthma exacerbation 04/27/2013  . Respiratory failure ([place]) 04/27/2013  . Uncontrolled diabetes mellitus ([place]) 04/27/2013  . Obesity 04/27/2013  . Allergic rhinitis, seasonal 08/11/2012  . Chronic cough 08/11/2012  . Sleep apnea, obstructive 12/04/2011    Past Surgical History:  Procedure Laterality Date  . APPENDECTOMY    . CESAREAN SECTION     x 3  . CHOLECYSTECTOMY N/A 03/02/2014   Procedure: LAPAROSCOPIC CHOLECYSTECTOMY;  Surgeon: Joyice Faster. Cornett, MD;  Location: [place];  Service: General;  Laterality: N/A;  . HERNIA REPAIR    . PANENDOSCOPY N/A 03/04/2017   Procedure: PANENDOSCOPY WITH POSSIBLE FOREIGN BODY REMOVAL;  Surgeon: Jodi Marble, MD;  Location: [place];  Service: ENT;  Laterality: N/A;  . ROTATOR CUFF REPAIR    . VESICOVAGINAL FISTULA CLOSURE W/ TAH  2009    OB History    No data available       Home Medications    Prior to Admission medications   Medication Sig Start Date End Date Taking? Authorizing Provider  albuterol (  PROVENTIL HFA;VENTOLIN HFA) 108 (90 Base) MCG/ACT inhaler Inhale 2 puffs into the lungs every 6 (six) hours as needed for wheezing or shortness of breath. 04/28/17  Yes Arnoldo Morale, MD  albuterol (PROVENTIL) (2.5 MG/3ML) 0.083% nebulizer solution Take 3 mLs (2.5 mg total) by nebulization every 6 (six) hours as needed for wheezing or shortness of breath. Reported on 02/22/2016 04/28/17  Yes Arnoldo Morale, MD  amLODipine (NORVASC) 10 MG tablet Take 1 tablet (10 mg total) by mouth daily. 04/28/17  Yes Arnoldo Morale, MD  atorvastatin (LIPITOR) 20 MG tablet Take 1 tablet (20 mg total) by  mouth daily. 04/28/17  Yes Arnoldo Morale, MD  carvedilol (COREG) 6.25 MG tablet Take 1 tablet (6.25 mg total) by mouth 2 (two) times daily with a meal. 04/28/17  Yes Amao, Enobong, MD  chlorhexidine gluconate, MEDLINE KIT, (PERIDEX) 0.12 % solution 15 mLs by Mouth Rinse route 2 (two) times daily. 03/05/17  Yes Jennelle Human B, NP  Dulaglutide 1.5 MG/0.5ML SOPN Inject 1.5 mg into the skin. 03/18/17  Yes [provider]  enoxaparin (LOVENOX) 40 MG/0.4ML injection Inject 40 mg into the skin. 03/18/17  Yes [provider]  Fluticasone-Salmeterol (ADVAIR DISKUS) 250-50 MCG/DOSE AEPB Inhale 1 puff into the lungs 2 (two) times daily. 04/28/17  Yes Arnoldo Morale, MD  gabapentin (NEURONTIN) 300 MG capsule Take 1 capsule (300 mg total) by mouth 3 (three) times daily. 04/28/17  Yes Arnoldo Morale, MD  methocarbamol (ROBAXIN) 500 MG tablet Take 1 tablet (500 mg total) by mouth every 8 (eight) hours as needed for muscle spasms. 04/28/17  Yes Arnoldo Morale, MD  pantoprazole (PROTONIX) 40 MG tablet Take 40 mg by mouth. 03/19/17  Yes [provider]  sertraline (ZOLOFT) 100 MG tablet Place 1 tablet (100 mg total) into feeding tube daily. 04/28/17  Yes Arnoldo Morale, MD  simvastatin (ZOCOR) 10 MG tablet Take 10 mg by mouth daily.   Yes [provider]  topiramate (TOPAMAX) 100 MG tablet Take 1 tablet (100 mg total) by mouth 2 (two) times daily. 04/28/17  Yes Amao, Charlane Ferretti, MD  budesonide (PULMICORT) 0.5 MG/2ML nebulizer solution Take 2 mLs (0.5 mg total) by nebulization 2 (two) times daily. Patient not taking: Reported on 04/28/2017 02/27/17   Marjie Skiff, MD  Nutritional Supplements (FEEDING SUPPLEMENT, VITAL HIGH PROTEIN,) LIQD liquid Place 1,000 mLs into feeding tube daily. Patient not taking: Reported on 04/28/2017 03/06/17   Corey Harold, NP  olopatadine (PATANOL) 0.1 % ophthalmic solution Place 1 drop into the right eye 2 (two) times daily. Patient not taking: Reported on 05/06/2017 09/29/16    Arnoldo Morale, MD    Family History Family History  Problem Relation Age of Onset  . Heart attack Mother   . Stroke Mother   . Diabetes Mother   . Hypertension Mother   . Arthritis Mother   . Stroke Father   . Hypertension Sister   . Diabetes Sister   . Seizures Brother   . Diabetes Brother     Social History Social History  Substance Use Topics  . Smoking status: Former Smoker    Packs/day: 0.25    Years: 22.00    Types: Cigarettes  . Smokeless tobacco: Never Used  . Alcohol use No     Comment: socially     Allergies   Reglan [metoclopramide]   Review of Systems Review of Systems  Unable to perform ROS: Acuity of condition     Physical Exam Updated Vital Signs BP 138/81  Pulse 82   Temp 98.9 F (37.2 C) (Oral)   Resp (!) 26   SpO2 100%   Physical Exam  Constitutional: She is oriented to person, place, and time.  Good color, alert  HENT:  Head: Normocephalic and atraumatic.  Eyes: Conjunctivae are normal.  Neck: Neck supple.  Cardiovascular: Normal rate and regular rhythm.   Pulmonary/Chest:  Slight tachypnea and dyspnea  Abdominal: Soft. Bowel sounds are normal.  Musculoskeletal: Normal range of motion.  Neurological: She is alert and oriented to person, place, and time.  Skin: Skin is warm and dry.  Psychiatric: She has a normal mood and affect. Her behavior is normal.  Nursing note and vitals reviewed.    ED Treatments / Results  Labs (all labs ordered are listed, but only abnormal results are displayed) Labs Reviewed  COMPREHENSIVE METABOLIC PANEL - Abnormal; Notable for the following:       Result Value   Potassium 3.3 (*)    CO2 20 (*)    Glucose, Bld 274 (*)    Creatinine, Ser 1.09 (*)    GFR calc non Af Amer 59 (*)    All other components within normal limits  CBC WITH DIFFERENTIAL/PLATELET - Abnormal; Notable for the following:    WBC 11.4 (*)    Hemoglobin 10.7 (*)    HCT 33.1 (*)    Lymphs Abs 5.9 (*)    All other  components within normal limits  I-STAT CG4 LACTIC ACID, ED - Abnormal; Notable for the following:    Lactic Acid, Venous 3.94 (*)    All other components within normal limits  CULTURE, BLOOD (ROUTINE X 2)  CULTURE, BLOOD (ROUTINE X 2)  URINALYSIS, ROUTINE W REFLEX MICROSCOPIC  I-STAT TROPONIN, ED    EKG  EKG Interpretation None       Radiology Dg Chest Port 1 View  Result Date: 05/06/2017 CLINICAL DATA:  Shortness of breath EXAM: PORTABLE CHEST 1 VIEW COMPARISON:  Chest radiograph 03/06/2017 FINDINGS: Tracheostomy tube tip is in appropriate position at the level of the clavicles. The lungs are clear. Normal cardiomediastinal contours. IMPRESSION: No focal airspace disease. Electronically Signed   By: Ulyses Jarred M.D.   On: 05/06/2017 18:02    Procedures Procedures (including critical care time)  Medications Ordered in ED Medications  vancomycin (VANCOCIN) 1,250 mg in sodium chloride 0.9 % 250 mL IVPB (1,250 mg Intravenous New Bag/Given 05/06/17 1806)  ceFEPIme (MAXIPIME) 1 g in dextrose 5 % 50 mL IVPB (1 g Intravenous New Bag/Given 05/06/17 1816)  fentaNYL (SUBLIMAZE) injection 50 mcg (not administered)  sodium chloride 0.9 % bolus 1,000 mL (1,000 mLs Intravenous New Bag/Given 05/06/17 1805)  ipratropium-albuterol (DUONEB) 0.5-2.5 (3) MG/3ML nebulizer solution 3 mL (3 mLs Nebulization Given 05/06/17 1741)  sodium chloride 0.9 % bolus 1,000 mL (1,000 mLs Intravenous New Bag/Given 05/06/17 1805)     Initial Impression / Assessment and Plan / ED Course  I have reviewed the triage vital signs and the nursing notes.  Pertinent labs & imaging results that were available during my care of the patient were reviewed by me and considered in my medical decision making (see chart for details).    CRITICAL CARE Performed by: Nat Christen Total critical care time: 30 minutes Critical care time was exclusive of separately billable procedures and treating other patients. Critical care was  necessary to treat or prevent imminent or life-threatening deterioration. Critical care was time spent personally by me on the following activities: development of treatment plan with patient  and/or surrogate as well as nursing, discussions with consultants, evaluation of patient's response to treatment, examination of patient, obtaining history from patient or surrogate, ordering and performing treatments and interventions, ordering and review of laboratory studies, ordering and review of radiographic studies, pulse oximetry and re-evaluation of patient's condition.  Patient is tachypneic and  dyspneic. She responded well to a nebulizer treatment. Vigorous IV hydration was initiated along with antibiotics for healthcare associated pneumonia. Will admit to gen medicine.  1830:  Patient reexamined. She is less dyspneic. Her color remains good. She is alert. Vital signs are stable.   Final Clinical Impressions(s) / ED Diagnoses   Final diagnoses:  Sepsis, due to unspecified organism [facility])    New Prescriptions New Prescriptions   No medications on file     Nat Christen, MD 05/06/17 [phone]

## 2017-05-06 NOTE — ED Triage Notes (Signed)
Pt presents with c/o shortness of breath. Her shortness of breath began about two days ago. she's had a productive cough with bloody sputum, chest pain. She denies fevers. She has a trach and has been increasing her home oxygen at home with no relief

## 2017-05-06 NOTE — ED Notes (Signed)
Per main lab, unable to run procalcitonin due to hemolysis. Will pull another specimen.

## 2017-05-06 NOTE — ED Notes (Signed)
RT at bedside.

## 2017-05-07 DIAGNOSIS — Z8261 Family history of arthritis: Secondary | ICD-10-CM | POA: Diagnosis not present

## 2017-05-07 DIAGNOSIS — E876 Hypokalemia: Secondary | ICD-10-CM | POA: Diagnosis not present

## 2017-05-07 DIAGNOSIS — R05 Cough: Secondary | ICD-10-CM

## 2017-05-07 DIAGNOSIS — Z9049 Acquired absence of other specified parts of digestive tract: Secondary | ICD-10-CM | POA: Diagnosis not present

## 2017-05-07 DIAGNOSIS — Z930 Tracheostomy status: Secondary | ICD-10-CM | POA: Diagnosis not present

## 2017-05-07 DIAGNOSIS — E1142 Type 2 diabetes mellitus with diabetic polyneuropathy: Secondary | ICD-10-CM | POA: Diagnosis not present

## 2017-05-07 DIAGNOSIS — Z87891 Personal history of nicotine dependence: Secondary | ICD-10-CM | POA: Diagnosis not present

## 2017-05-07 DIAGNOSIS — Z823 Family history of stroke: Secondary | ICD-10-CM | POA: Diagnosis not present

## 2017-05-07 DIAGNOSIS — Z888 Allergy status to other drugs, medicaments and biological substances status: Secondary | ICD-10-CM | POA: Diagnosis not present

## 2017-05-07 DIAGNOSIS — J45901 Unspecified asthma with (acute) exacerbation: Secondary | ICD-10-CM

## 2017-05-07 DIAGNOSIS — E872 Acidosis: Secondary | ICD-10-CM | POA: Diagnosis present

## 2017-05-07 DIAGNOSIS — A419 Sepsis, unspecified organism: Secondary | ICD-10-CM

## 2017-05-07 DIAGNOSIS — G8929 Other chronic pain: Secondary | ICD-10-CM | POA: Diagnosis present

## 2017-05-07 DIAGNOSIS — I10 Essential (primary) hypertension: Secondary | ICD-10-CM | POA: Diagnosis not present

## 2017-05-07 DIAGNOSIS — J9621 Acute and chronic respiratory failure with hypoxia: Secondary | ICD-10-CM | POA: Diagnosis not present

## 2017-05-07 DIAGNOSIS — Z794 Long term (current) use of insulin: Secondary | ICD-10-CM | POA: Diagnosis not present

## 2017-05-07 DIAGNOSIS — Z8249 Family history of ischemic heart disease and other diseases of the circulatory system: Secondary | ICD-10-CM | POA: Diagnosis not present

## 2017-05-07 DIAGNOSIS — M549 Dorsalgia, unspecified: Secondary | ICD-10-CM | POA: Diagnosis present

## 2017-05-07 DIAGNOSIS — R042 Hemoptysis: Secondary | ICD-10-CM | POA: Diagnosis not present

## 2017-05-07 DIAGNOSIS — J40 Bronchitis, not specified as acute or chronic: Secondary | ICD-10-CM | POA: Diagnosis present

## 2017-05-07 DIAGNOSIS — M1990 Unspecified osteoarthritis, unspecified site: Secondary | ICD-10-CM | POA: Diagnosis present

## 2017-05-07 DIAGNOSIS — Z833 Family history of diabetes mellitus: Secondary | ICD-10-CM | POA: Diagnosis not present

## 2017-05-07 DIAGNOSIS — D649 Anemia, unspecified: Secondary | ICD-10-CM | POA: Diagnosis not present

## 2017-05-07 DIAGNOSIS — Z79899 Other long term (current) drug therapy: Secondary | ICD-10-CM | POA: Diagnosis not present

## 2017-05-07 DIAGNOSIS — Z9119 Patient's noncompliance with other medical treatment and regimen: Secondary | ICD-10-CM | POA: Diagnosis not present

## 2017-05-07 DIAGNOSIS — G4733 Obstructive sleep apnea (adult) (pediatric): Secondary | ICD-10-CM

## 2017-05-07 DIAGNOSIS — K219 Gastro-esophageal reflux disease without esophagitis: Secondary | ICD-10-CM | POA: Diagnosis present

## 2017-05-07 LAB — GLUCOSE, CAPILLARY
GLUCOSE-CAPILLARY: 358 mg/dL — AB (ref 65–99)
Glucose-Capillary: 296 mg/dL — ABNORMAL HIGH (ref 65–99)
Glucose-Capillary: 334 mg/dL — ABNORMAL HIGH (ref 65–99)
Glucose-Capillary: 364 mg/dL — ABNORMAL HIGH (ref 65–99)

## 2017-05-07 LAB — BASIC METABOLIC PANEL
Anion gap: 14 (ref 5–15)
BUN: 6 mg/dL (ref 6–20)
CHLORIDE: 105 mmol/L (ref 101–111)
CO2: 17 mmol/L — AB (ref 22–32)
Calcium: 8.6 mg/dL — ABNORMAL LOW (ref 8.9–10.3)
Creatinine, Ser: 1.08 mg/dL — ABNORMAL HIGH (ref 0.44–1.00)
GFR calc Af Amer: >60 mL/min (ref >60)
GFR calc non Af Amer: >60 mL/min (ref >60)
GLUCOSE: 307 mg/dL — AB (ref 65–99)
POTASSIUM: 3.6 mmol/L (ref 3.5–5.1)
SODIUM: 136 mmol/L (ref 135–145)

## 2017-05-07 LAB — CBC
HEMATOCRIT: 31.3 % — AB (ref 36.0–46.0)
Hemoglobin: 10.3 g/dL — ABNORMAL LOW (ref 12.0–15.0)
MCH: 27.1 pg (ref 26.0–34.0)
MCHC: 32.9 g/dL (ref 30.0–36.0)
MCV: 82.4 fL (ref 78.0–100.0)
Platelets: 370 10*3/uL (ref 150–400)
RBC: 3.8 MIL/uL — ABNORMAL LOW (ref 3.87–5.11)
RDW: 14.1 % (ref 11.5–15.5)
WBC: 10.1 10*3/uL (ref 4.0–10.5)

## 2017-05-07 LAB — URINALYSIS, ROUTINE W REFLEX MICROSCOPIC
Bilirubin Urine: NEGATIVE
Glucose, UA: NEGATIVE mg/dL
Hgb urine dipstick: NEGATIVE
KETONES UR: NEGATIVE mg/dL
LEUKOCYTES UA: NEGATIVE
NITRITE: NEGATIVE
PH: 6 (ref 5.0–8.0)
Protein, ur: NEGATIVE mg/dL
SPECIFIC GRAVITY, URINE: 1.038 — AB (ref 1.005–1.030)

## 2017-05-07 LAB — MRSA PCR SCREENING: MRSA by PCR: NEGATIVE

## 2017-05-07 LAB — LACTIC ACID, PLASMA: Lactic Acid, Venous: 2.9 mmol/L (ref 0.5–1.9)

## 2017-05-07 LAB — PROCALCITONIN

## 2017-05-07 LAB — CBG MONITORING, ED: Glucose-Capillary: 317 mg/dL — ABNORMAL HIGH (ref 65–99)

## 2017-05-07 MED ORDER — ACETYLCYSTEINE 10 % IN SOLN
2.0000 mL | Freq: Four times a day (QID) | RESPIRATORY_TRACT | Status: DC
  Filled 2017-05-07: qty 2

## 2017-05-07 MED ORDER — ACETYLCYSTEINE 20 % IN SOLN
2.0000 mL | Freq: Four times a day (QID) | RESPIRATORY_TRACT | Status: DC
  Administered 2017-05-07 – 2017-05-08 (×4): 2 mL via RESPIRATORY_TRACT
  Filled 2017-05-07 (×4): qty 4

## 2017-05-07 MED ORDER — ORAL CARE MOUTH RINSE
15.0000 mL | Freq: Two times a day (BID) | OROMUCOSAL | Status: DC
  Administered 2017-05-07 – 2017-05-08 (×3): 15 mL via OROMUCOSAL

## 2017-05-07 MED ORDER — CHLORHEXIDINE GLUCONATE 0.12 % MT SOLN
15.0000 mL | Freq: Two times a day (BID) | OROMUCOSAL | Status: DC
  Administered 2017-05-07 – 2017-05-08 (×2): 15 mL via OROMUCOSAL
  Filled 2017-05-07 (×2): qty 15

## 2017-05-07 NOTE — Progress Notes (Signed)
Patient ID: [name], female   DOB: [birth year], 46 y.o.   MRN: [id number]  PROGRESS NOTE    [name]  WCB:[id number] DOB: [birth year] DOA: 05/06/2017 PCP: Arnoldo Morale, MD   Brief Narrative:  46 y.o. female with a past medical history significant for asthma, OSA noncompliant, HTN, and DM with recent unfortunate intubation for asthma resulting in intubation injury and now tracheostomy-dependent who presented with cough and dyspnea. On presentation, patient was tachycardic, tachypneic with elevated lactic acid. She was started on broad-spectrum antibiotics for probable HCAP.  Assessment & Plan:   Principal Problem:   Cough Active Problems:   Sleep apnea, obstructive   Asthma exacerbation   Essential hypertension   Type 2 diabetes mellitus with diabetic polyneuropathy, without long-term current use of insulin (HCC)   Chronic pain syndrome   Tracheostomy status (HCC)   Normocytic anemia   Hypokalemia   Asthma exacerbation and/or bronchitis:  - CTA chest rules out PE, pneumonia.  No evidence of neck swelling, innominate fistula on CT. Doubt tracheitis/tracheobronchitis.   -Continue Solumedrol IV, breo and nebulized albuterol  - Called pulmonary for consult. Follow recommendations -Continue oxygen supplementation - Discontinue Zithromax  Acute and chronic hypoxic respiratory failure - Continue oxygen supplementation via trach - Follow pulmonary recommendations  Elevated lactic acid:  - Continue broad-spectrum antibiotics for 1 more day. Follow cultures. No evidence of pneumonia on CT chest.    Hyperetnsion:  -Hold amlodipine for now -Continue BB -Continue statin  4. Diabetes:  -Hold Trulicty -SSI - Check  hemoglobin A1c  5. Hypokalemia:  Improved Repeat a.m.  6. Tracheostomy in place:  Do not suspect complications.   admitting hospitalist discussed with ENT on call, if new trach problems arise, can discuss with Dr. Erik Obey vs  [facility] E[facility].  7. Normocytic anemia:  Mild, stable  8. Chronic pain/migraines: -Continue topiramate -Continue gabapentin, Robaxin -Continue sertraline  9. Sleep apnea: -Outpatient follow-up with pulmonary    DVT prophylaxis: Lovenox Code Status:  Full Family Communication: Discussed with husband present at bedside Disposition Plan: Home in 1-3 days  Consultants: Pulmonary Case was discussed with ENT on call by admitting hospitalist  Procedures: None   Antimicrobials:  Cefepime, vancomycin and Zithromax from 05/06/2017  Subjective: Patient seen and examined at bedside. She still complaining of cough and shortness of breath but feels a slightly better. No overnight fever or vomiting  Objective: Vitals:   05/07/17 0700 05/07/17 0744 05/07/17 0756 05/07/17 0807  BP:  117/79    Pulse:  (!) 103 (!) 102 100  Resp:   14   Temp:  98.4 F (36.9 C)    TempSrc:  Oral    SpO2: 100% 99% 100% 100%  Weight:      Height:        Intake/Output Summary (Last 24 hours) at 05/07/17 0950 Last data filed at 05/07/17 6160  Gross per 24 hour  Intake             2550 ml  Output              400 ml  Net             2150 ml   Filed Weights   05/07/17 0507  Weight: 104.4 kg (230 lb 2.6 oz)    Examination:  General exam: Appears calm and comfortable  Neck: Tracheostomy is in place Respiratory system: Bilateral decreased breath sound at bases With scattered crackles Cardiovascular system: S1 & S2 heard, rate controlled Gastrointestinal system:  Abdomen is nondistended, soft and nontender. Normal bowel sounds heard. Central nervous system: Alert and oriented. No focal neurological deficits. Moving extremities Extremities: No cyanosis, clubbing, edema  Skin: No rashes, lesions or ulcers    Data Reviewed: I have personally reviewed following labs and imaging studies  CBC:  Recent Labs Lab 05/06/17 1706 05/07/17 0533  WBC 11.4* 10.1  NEUTROABS 4.8  --   HGB 10.7* 10.3*  HCT  33.1* 31.3*  MCV 83.8 82.4  PLT 396 027   Basic Metabolic Panel:  Recent Labs Lab 05/06/17 1706 05/06/17 2127 05/07/17 0533  NA 139  --  136  K 3.3*  --  3.6  CL 107  --  105  CO2 20*  --  17*  GLUCOSE 274*  --  307*  BUN 8  --  6  CREATININE 1.09*  --  1.08*  CALCIUM 9.0  --  8.6*  MG  --  1.4*  --    GFR: Estimated Creatinine Clearance: 71.6 mL/min (A) (by C-G formula based on SCr of 1.08 mg/dL (H)). Liver Function Tests:  Recent Labs Lab 05/06/17 1706  AST 26  ALT 24  ALKPHOS 106  BILITOT 0.4  PROT 7.5  ALBUMIN 3.6   No results for input(s): LIPASE, AMYLASE in the last 168 hours. No results for input(s): AMMONIA in the last 168 hours. Coagulation Profile: No results for input(s): INR, PROTIME in the last 168 hours. Cardiac Enzymes: No results for input(s): CKTOTAL, CKMB, CKMBINDEX, TROPONINI in the last 168 hours. BNP (last 3 results) No results for input(s): PROBNP in the last 8760 hours. HbA1C: No results for input(s): HGBA1C in the last 72 hours. CBG:  Recent Labs Lab 05/07/17 0213 05/07/17 0758  GLUCAP 317* 358*   Lipid Profile: No results for input(s): CHOL, HDL, LDLCALC, TRIG, CHOLHDL, LDLDIRECT in the last 72 hours. Thyroid Function Tests: No results for input(s): TSH, T4TOTAL, FREET4, T3FREE, THYROIDAB in the last 72 hours. Anemia Panel: No results for input(s): VITAMINB12, FOLATE, FERRITIN, TIBC, IRON, RETICCTPCT in the last 72 hours. Sepsis Labs:  Recent Labs Lab 05/06/17 1719 05/06/17 2204 05/07/17 0211  PROCALCITON  --   --  <0.10  LATICACIDVEN 3.94* 3.1* 2.9*    Recent Results (from the past 240 hour(s))  MRSA PCR Screening     Status: None   Collection Time: 05/07/17  5:09 AM  Result Value Ref Range Status   MRSA by PCR NEGATIVE NEGATIVE Final    Comment:        The GeneXpert MRSA Assay (FDA approved for NASAL specimens only), is one component of a comprehensive MRSA colonization surveillance program. It is not intended  to diagnose MRSA infection nor to guide or monitor treatment for MRSA infections.          Radiology Studies: Ct Soft Tissue Neck W Contrast  Result Date: 05/06/2017 CLINICAL DATA:  Chronic laryngitis EXAM: CT NECK WITH CONTRAST TECHNIQUE: Multidetector CT imaging of the neck was performed using the standard protocol following the bolus administration of intravenous contrast. CONTRAST:  100 mL Isovue 370 COMPARISON:  CT neck 03/04/2017 FINDINGS: Pharynx and larynx: --Nasopharynx: Fossae of Rosenmuller are clear. Normal adenoid tonsils for age. --Oral cavity and oropharynx: The palatine and lingual tonsils are normal. The visible oral cavity and floor of mouth are normal. Small torus palatini. --Hypopharynx: Normal vallecula and pyriform sinuses. --Larynx: Normal epiglottis and pre-epiglottic space. Normal aryepiglottic and vocal folds. Status post tracheostomy with tube tip within the trachea at the level  of the clavicular heads. --Retropharyngeal space: No abscess, effusion or lymphadenopathy. Salivary glands: --Parotid: No mass lesion or inflammation. No sialolithiasis or ductal dilatation. --Submandibular: Symmetric without inflammation. No sialolithiasis or ductal dilatation. --Sublingual: Normal. No ranula or other visible lesion of the base of tongue and floor of mouth. Thyroid: Normal. Lymph nodes: No enlarged or abnormal density lymph nodes. Vascular: Major cervical vessels are patent. Limited intracranial: Normal. Visualized orbits: Normal. Mastoids and visualized paranasal sinuses: No fluid levels or advanced mucosal thickening. No mastoid effusion. Skeleton: No bony spinal canal stenosis. No lytic or blastic lesions. Upper chest: Clear. Other: None. IMPRESSION: 1. Status post tracheostomy with normal positioning of respiratory tube. 2. No focal pharyngeal or laryngeal abnormality. Electronically Signed   By: Ulyses Jarred M.D.   On: 05/06/2017 21:50   Ct Angio Chest Pe W And/or Wo  Contrast  Result Date: 05/06/2017 CLINICAL DATA:  Shortness of breath and hemoptysis EXAM: CT ANGIOGRAPHY CHEST WITH CONTRAST TECHNIQUE: Multidetector CT imaging of the chest was performed using the standard protocol during bolus administration of intravenous contrast. Multiplanar CT image reconstructions and MIPs were obtained to evaluate the vascular anatomy. CONTRAST:  100 mL Isovue 370 COMPARISON:  Chest CT 03/04/2017 FINDINGS: Cardiovascular: Contrast injection is sufficient to demonstrate satisfactory opacification of the pulmonary arteries to the segmental level. There is no pulmonary embolus. The main pulmonary artery is within normal limits for size. There is no CT evidence of acute right heart strain. The visualized aorta is normal. There is a normal variant aortic arch branching pattern with the brachiocephalic and left common carotid arteries sharing a common origin. Heart size is normal, without pericardial effusion. Mediastinum/Nodes: No mediastinal, hilar or axillary lymphadenopathy. The visualized thyroid and thoracic esophageal course are unremarkable. Lungs/Pleura: Tracheostomy tube tip at the level of the clavicular heads. No pulmonary nodules or masses. No pleural effusion or pneumothorax. No focal airspace consolidation. No focal pleural abnormality. Upper Abdomen: Contrast bolus timing is not optimized for evaluation of the abdominal organs. Within this limitation, the visualized organs of the upper abdomen are normal. Musculoskeletal: No chest wall abnormality. No acute or significant osseous findings. Review of the MIP images confirms the above findings. IMPRESSION: 1. No pulmonary embolus or acute aortic syndrome. 2. No finding to explain the reported hemoptysis. Electronically Signed   By: Ulyses Jarred M.D.   On: 05/06/2017 22:07   Dg Chest Port 1 View  Result Date: 05/06/2017 CLINICAL DATA:  Shortness of breath EXAM: PORTABLE CHEST 1 VIEW COMPARISON:  Chest radiograph 03/06/2017  FINDINGS: Tracheostomy tube tip is in appropriate position at the level of the clavicles. The lungs are clear. Normal cardiomediastinal contours. IMPRESSION: No focal airspace disease. Electronically Signed   By: Ulyses Jarred M.D.   On: 05/06/2017 18:02        Scheduled Meds: . albuterol  2.5 mg Nebulization Q6H  . atorvastatin  20 mg Oral Daily  . carvedilol  6.25 mg Oral BID WC  . enoxaparin (LOVENOX) injection  40 mg Subcutaneous Q24H  . fluticasone furoate-vilanterol  1 puff Inhalation Daily  . gabapentin  300 mg Oral TID  . insulin aspart  0-5 Units Subcutaneous QHS  . insulin aspart  0-9 Units Subcutaneous TID WC  . methylPREDNISolone (SOLU-MEDROL) injection  40 mg Intravenous Daily  . pantoprazole  40 mg Oral Daily  . sertraline  100 mg Per Tube Daily  . topiramate  100 mg Oral BID   Continuous Infusions: . 0.9 % NaCl with KCl 40 mEq /  L 75 mL/hr (05/07/17 0835)  . azithromycin Stopped (05/07/17 0122)  . ceFEPime (MAXIPIME) IV Stopped (05/06/17 1936)  . vancomycin Stopped (05/06/17 2038)     LOS: 0 days        Aline August, MD [facility] Pager [phone]  If 7PM-7AM, please contact night-coverage [email or web address] Password [email or web address] 05/07/2017, 9:50 AM

## 2017-05-07 NOTE — Progress Notes (Signed)
Time given was 1442  With mucomyst

## 2017-05-07 NOTE — Progress Notes (Signed)
Inpatient Diabetes Program Recommendations  AACE/ADA: New Consensus Statement on Inpatient Glycemic Control (2015)  Target Ranges:  Prepandial:   less than 140 mg/dL      Peak postprandial:   less than 180 mg/dL (1-2 hours)      Critically ill patients:  140 - 180 mg/dL   Lab Results  Component Value Date   GLUCAP 358 (H) 05/07/2017   HGBA1C 6.7 04/28/2017    Review of Glycemic ControlResults for NOHELI, MELDER MAE (MRN [id number]) as of 05/07/2017 10:28  Ref. Range 05/07/2017 02:13 05/07/2017 07:58  Glucose-Capillary Latest Ref Range: 65 - 99 mg/dL 317 (H) 358 (H)   Diabetes history: Type 2 diabetes Outpatient Diabetes medications: Dulaglutide 1.5 mg daily Current orders for Inpatient glycemic control:  Novolog sensitive tid with meals and HS-Solumedrol 40 mg IV daily Inpatient Diabetes Program Recommendations:   While in the hospital, consider adding Lantus 20 units daily. Also please increase Novolog correction to resistant while patient is on steroids.   Thanks, Adah Perl, RN, BC-ADM Inpatient Diabetes Coordinator Pager [phone] (8a-5p)

## 2017-05-07 NOTE — H&P (Signed)
History and Physical  Patient Name: [name]     AVW:[id number]    DOB: [birth year]    DOA: 05/06/2017 PCP: Arnoldo Morale, MD  Patient coming from: Home  Chief Complaint: Hemoptysis, cough, dyspnea      HPI: [name] is a 46 y.o. female with a past medical history significant for asthma, OSA noncompliant, HTN, and DM with recent unfortunate intubation for asthma resulting in intubation injury and now tracheostomy-dependent who presents with cough and dyspnea.  The patient has a long and complicated recent history, see below.  However, lately, she has been at home, tolerating her new trach and taking an oral diet.  About three days ago, she started to notice some blood tinged sputum, pink and frothy.  Then over the next few days, her cough got worse and worse, and she started to have right sided chest pain worse with coughing.  There was no thick sputum, no new neck swelling or pain, no frank blood, and no fever.  ED course: -Afebrile, heart rate 120, respirations 28, BP 115/59, pulse ox normal on room air through trach collar -Na 139, K 3.3, Cr 1.09 (baseline 1.1), WBC 11.4K, Hgb 10.7 -CXR 1V negative for opacity -Troponin negative -Lactic acid 3.9 -Blood cultures were obtained, she was given vancomycin and cefepime for HCAP -TRH were asked to evaluate for HCAP with negative chest x-ray    Back story: In May, she was admitted to [facility] for respiratory failure from asthma flare (ultimately Rhinovirus and Moraxella+), requiring intubation. ICU stay was complicated by renal failure requiring CRRT. Was intubated 1 week, but after extubation, had an event where she coughed up a foreign body, which was sent to pathology and ultimately discovered to be a piece of plastic. She was evaluated by ENT Dr. Erik Obey who recommended laryngoscopy, bronchoscopy and esophagoscopy which were done and showed some intubation injury, vocal cord injury and tracheal necrosis.   CT chest at that time showed pneumonia and she was started on Zosyn.  She was subsequently transferred to [facility] for ENT consultation there.  She was at [facility] from 6/15 to 6/27.  Taken initially to the OR for exploration and tracheostomy placement.  There was concern that some of her necrotic trachea (from the injury) in the chest overlied the innominate artery, with risk for development of a traceho-innominate fistula, and so CT surgery were consulted, but they felt no intervention was warranted, fistula development was unlikely, and as a precaution she should have strict BP control.    Rheumatology were consulted to assess whether her tracheal injury could be the result of an autoimmune process, but they felt this was unliekly.  She completed 10 days Zosyn for her pneuonia, and was dsicharged to LTAC.  She was in LTAC for about 2 weeks, home since Mid-July with family.  At her last ENT visit 2 weeks ago, and their notes states: "Laryngeal and tracheal examination show marked improvement.  --Continue regular diet --Will hold off on PMV evaluation until next OR trip. Finger plugging for voice as tolerated is acceptable.  Trach care:  --keep extra tracheotomy tube at bedside (6 DCT) --keep obturator above patient's bed --maintain humidification of trach tube with humidified trach mask --routine suctioning and as needed by the patient, but avoid deep suctioning (nothing beyond 10 cm) --we will schedule for suspension [employer] laryngoscopy with overnight stay and possible decannulation depending on the status of her subglottis"         ROS: Review of Systems  Constitutional: Negative for fever and malaise/fatigue.  Respiratory: Positive for cough, hemoptysis, shortness of breath and wheezing. Negative for sputum production.   Cardiovascular: Positive for chest pain.  All other systems reviewed and are negative.         Past Medical History:  Diagnosis Date  . Arthritis   . Asthma   .  Chronic back pain   . Chronic chest pain   . Depression   . DM (diabetes mellitus) ([place])    INSULIN DEPENDENT  . GERD (gastroesophageal reflux disease)   . Headache(784.0)   . Hypertension   . Hypokalemia     Past Surgical History:  Procedure Laterality Date  . APPENDECTOMY    . CESAREAN SECTION     x 3  . CHOLECYSTECTOMY N/A 03/02/2014   Procedure: LAPAROSCOPIC CHOLECYSTECTOMY;  Surgeon: Joyice Faster. Cornett, MD;  Location: [place];  Service: General;  Laterality: N/A;  . HERNIA REPAIR    . PANENDOSCOPY N/A 03/04/2017   Procedure: PANENDOSCOPY WITH POSSIBLE FOREIGN BODY REMOVAL;  Surgeon: Jodi Marble, MD;  Location: [place];  Service: ENT;  Laterality: N/A;  . ROTATOR CUFF REPAIR    . VESICOVAGINAL FISTULA CLOSURE W/ TAH  2009    Social History: Patient lives with her husband.  The patient walks unassisted.  Former smoker.  Allergies  Allergen Reactions  . Reglan [Metoclopramide] Other (See Comments)    Panic attack    Family history: family history includes Arthritis in her mother; Diabetes in her brother, mother, and sister; Heart attack in her mother; Hypertension in her mother and sister; Seizures in her brother; Stroke in her father and mother.  Prior to Admission medications   Medication Sig Start Date End Date Taking? Authorizing Provider  albuterol (PROVENTIL HFA;VENTOLIN HFA) 108 (90 Base) MCG/ACT inhaler Inhale 2 puffs into the lungs every 6 (six) hours as needed for wheezing or shortness of breath. 04/28/17  Yes Arnoldo Morale, MD  albuterol (PROVENTIL) (2.5 MG/3ML) 0.083% nebulizer solution Take 3 mLs (2.5 mg total) by nebulization every 6 (six) hours as needed for wheezing or shortness of breath. Reported on 02/22/2016 04/28/17  Yes Arnoldo Morale, MD  amLODipine (NORVASC) 10 MG tablet Take 1 tablet (10 mg total) by mouth daily. 04/28/17  Yes Arnoldo Morale, MD  atorvastatin (LIPITOR) 20 MG tablet Take 1 tablet (20 mg total) by mouth daily. 04/28/17  Yes Arnoldo Morale, MD    carvedilol (COREG) 6.25 MG tablet Take 1 tablet (6.25 mg total) by mouth 2 (two) times daily with a meal. 04/28/17  Yes Amao, Enobong, MD  chlorhexidine gluconate, MEDLINE KIT, (PERIDEX) 0.12 % solution 15 mLs by Mouth Rinse route 2 (two) times daily. 03/05/17  Yes Jennelle Human B, NP  Dulaglutide 1.5 MG/0.5ML SOPN Inject 1.5 mg into the skin. 03/18/17  Yes [provider]  enoxaparin (LOVENOX) 40 MG/0.4ML injection Inject 40 mg into the skin. 03/18/17  Yes [provider]  Fluticasone-Salmeterol (ADVAIR DISKUS) 250-50 MCG/DOSE AEPB Inhale 1 puff into the lungs 2 (two) times daily. 04/28/17  Yes Arnoldo Morale, MD  gabapentin (NEURONTIN) 300 MG capsule Take 1 capsule (300 mg total) by mouth 3 (three) times daily. 04/28/17  Yes Arnoldo Morale, MD  methocarbamol (ROBAXIN) 500 MG tablet Take 1 tablet (500 mg total) by mouth every 8 (eight) hours as needed for muscle spasms. 04/28/17  Yes Arnoldo Morale, MD  pantoprazole (PROTONIX) 40 MG tablet Take 40 mg by mouth. 03/19/17  Yes [provider]  sertraline (ZOLOFT) 100 MG tablet  Place 1 tablet (100 mg total) into feeding tube daily. 04/28/17  Yes Arnoldo Morale, MD  topiramate (TOPAMAX) 100 MG tablet Take 1 tablet (100 mg total) by mouth 2 (two) times daily. 04/28/17  Yes Arnoldo Morale, MD       Physical Exam: BP 134/86 (BP Location: Right Arm)   Pulse (!) 104   Temp 98.9 F (37.2 C) (Oral)   Resp 20   Ht 5' 1"  (1.549 m)   Wt 104.4 kg (230 lb 2.6 oz)   SpO2 100%   BMI 43.49 kg/m  General appearance: Well-developed, obese adult female, alert and in moderate distress from dyspnea, cough.   Eyes: Anicteric, conjunctiva pink, lids and lashes normal. PERRL.    ENT: No nasal deformity, discharge, epistaxis.  Hearing normal. OP moist without lesions.   Neck: The tracheostomy is in place, appears secure. There is scant, whitish, thin, pink-tinged sputum only.  No tenderness or mass or edema or induration around trach. Skin: Warm and dry.   No suspicious rashes or lesions. Cardiac: Tachycardic, regular, nl S1-S2, no murmurs appreciated.  Capillary refill is brisk.  JVP not visible.  No LE edema.  Radial pulses 2+ and symmetric. Respiratory: Tachypneic, not labored.  Wheezes throughout, with expiration, coarse inspirations.  No rales. Abdomen: Abdomen soft.  No TTP. No ascites, distension, hepatosplenomegaly.   MSK: No deformities or effusions.  No cyanosis or clubbing. Neuro: Cranial nerves grossly nromal.Speech is fluent but hard to understand on trach.  Muscle strength normal.    Psych: Sensorium intact and responding to questions, attention normal.  Behavior appropriate.  Affect anxious.  Judgment and insight appear normal.     Labs on Admission:  I have personally reviewed following labs and imaging studies: CBC:  Recent Labs Lab 05/06/17 1706 05/07/17 0533  WBC 11.4* 10.1  NEUTROABS 4.8  --   HGB 10.7* 10.3*  HCT 33.1* 31.3*  MCV 83.8 82.4  PLT 396 016   Basic Metabolic Panel:  Recent Labs Lab 05/06/17 1706 05/06/17 2127  NA 139  --   K 3.3*  --   CL 107  --   CO2 20*  --   GLUCOSE 274*  --   BUN 8  --   CREATININE 1.09*  --   CALCIUM 9.0  --   MG  --  1.4*   GFR: Estimated Creatinine Clearance: 70.9 mL/min (A) (by C-G formula based on SCr of 1.09 mg/dL (H)).  Liver Function Tests:  Recent Labs Lab 05/06/17 1706  AST 26  ALT 24  ALKPHOS 106  BILITOT 0.4  PROT 7.5  ALBUMIN 3.6   CBG:  Recent Labs Lab 05/07/17 0213  GLUCAP 317*   Sepsis Labs: Lactic acid 3.9       Radiological Exams on Admission: Personally reviewed CXR shows no focal opacity; CT neck and CTA chest reports reviewed: Ct Soft Tissue Neck W Contrast  Result Date: 05/06/2017 CLINICAL DATA:  Chronic laryngitis EXAM: CT NECK WITH CONTRAST TECHNIQUE: Multidetector CT imaging of the neck was performed using the standard protocol following the bolus administration of intravenous contrast. CONTRAST:  100 mL Isovue 370  COMPARISON:  CT neck 03/04/2017 FINDINGS: Pharynx and larynx: --Nasopharynx: Fossae of Rosenmuller are clear. Normal adenoid tonsils for age. --Oral cavity and oropharynx: The palatine and lingual tonsils are normal. The visible oral cavity and floor of mouth are normal. Small torus palatini. --Hypopharynx: Normal vallecula and pyriform sinuses. --Larynx: Normal epiglottis and pre-epiglottic space. Normal aryepiglottic and vocal folds. Status  post tracheostomy with tube tip within the trachea at the level of the clavicular heads. --Retropharyngeal space: No abscess, effusion or lymphadenopathy. Salivary glands: --Parotid: No mass lesion or inflammation. No sialolithiasis or ductal dilatation. --Submandibular: Symmetric without inflammation. No sialolithiasis or ductal dilatation. --Sublingual: Normal. No ranula or other visible lesion of the base of tongue and floor of mouth. Thyroid: Normal. Lymph nodes: No enlarged or abnormal density lymph nodes. Vascular: Major cervical vessels are patent. Limited intracranial: Normal. Visualized orbits: Normal. Mastoids and visualized paranasal sinuses: No fluid levels or advanced mucosal thickening. No mastoid effusion. Skeleton: No bony spinal canal stenosis. No lytic or blastic lesions. Upper chest: Clear. Other: None. IMPRESSION: 1. Status post tracheostomy with normal positioning of respiratory tube. 2. No focal pharyngeal or laryngeal abnormality. Electronically Signed   By: Ulyses Jarred M.D.   On: 05/06/2017 21:50   Ct Angio Chest Pe W And/or Wo Contrast  Result Date: 05/06/2017 CLINICAL DATA:  Shortness of breath and hemoptysis EXAM: CT ANGIOGRAPHY CHEST WITH CONTRAST TECHNIQUE: Multidetector CT imaging of the chest was performed using the standard protocol during bolus administration of intravenous contrast. Multiplanar CT image reconstructions and MIPs were obtained to evaluate the vascular anatomy. CONTRAST:  100 mL Isovue 370 COMPARISON:  Chest CT 03/04/2017  FINDINGS: Cardiovascular: Contrast injection is sufficient to demonstrate satisfactory opacification of the pulmonary arteries to the segmental level. There is no pulmonary embolus. The main pulmonary artery is within normal limits for size. There is no CT evidence of acute right heart strain. The visualized aorta is normal. There is a normal variant aortic arch branching pattern with the brachiocephalic and left common carotid arteries sharing a common origin. Heart size is normal, without pericardial effusion. Mediastinum/Nodes: No mediastinal, hilar or axillary lymphadenopathy. The visualized thyroid and thoracic esophageal course are unremarkable. Lungs/Pleura: Tracheostomy tube tip at the level of the clavicular heads. No pulmonary nodules or masses. No pleural effusion or pneumothorax. No focal airspace consolidation. No focal pleural abnormality. Upper Abdomen: Contrast bolus timing is not optimized for evaluation of the abdominal organs. Within this limitation, the visualized organs of the upper abdomen are normal. Musculoskeletal: No chest wall abnormality. No acute or significant osseous findings. Review of the MIP images confirms the above findings. IMPRESSION: 1. No pulmonary embolus or acute aortic syndrome. 2. No finding to explain the reported hemoptysis. Electronically Signed   By: Ulyses Jarred M.D.   On: 05/06/2017 22:07   Dg Chest Port 1 View  Result Date: 05/06/2017 CLINICAL DATA:  Shortness of breath EXAM: PORTABLE CHEST 1 VIEW COMPARISON:  Chest radiograph 03/06/2017 FINDINGS: Tracheostomy tube tip is in appropriate position at the level of the clavicles. The lungs are clear. Normal cardiomediastinal contours. IMPRESSION: No focal airspace disease. Electronically Signed   By: Ulyses Jarred M.D.   On: 05/06/2017 18:02    EKG: Independently reviewed. Rate 114, QTc 493, normal sinus, tachycardia.  Echocardiogram June 2018: Report reviewed EF 65% Grade I  DD       Assessment/Plan  1. Asthma exacerbation and/or bronchitis:  Few days worsening cough, wheezing, pleuritic pain in patient with severe asthma.  CTA chest rules out PE, pneumonia.  No evidence of neck swelling, innominate fistula on CT. Doubt tracheitis/tracheobronchitis.   -Solumedrol IV -Nebulized albuterol -IV azithromycin -Continue Advair -Check procalcitonin -Her O2 sats are great on room air, and although she has slightly increased WOB, appears stable; if lactate trend up, or work of breathing worsens, will discuss with Pulm re: utility of positive  pressure ventilation through her trach   2. Elevated lactic acid:  Given lactic acidosis and recent complicated hospitalization, will err on side of empiric antibiotics for now.  -Check procalcitonin and de-escalate antibiotics if low -Trend lactic acid  3. Hyperetnsion:  -Hold amlodipine until hemodynamics clearer -Continue BB -Continue statin  4. Diabetes:  -Hold Trulicty -SSI  5. Hypokalemia:  Mild, from albuterol.  -Monitor K  6. Tracheostomy in place:  Do not suspect complications.  Discussed with ENT on call, if new trach problems arise, can discuss with Dr. Erik Obey vs [facility] ENT.  7. Normocytic anemia:  Mild, stable  8. Chronic pain/migraines: -Continue topiramate -Continue gabapentin, Robaxin -Continue sertraline  9. Sleep apnea: -Consult to RT re: home measures    DVT prophylaxis: Continue home lovenox  Code Status: FULL  Family Communication: Husband and brother at bedside  Disposition Plan: Anticipate IV steroids, nebs, and empiric antibiotics. Consults called: Discussed with ENT by phone, who were not asked to evaluate patietn in person Admission status: OBS At the point of initial evaluation, it is my clinical opinion that admission for OBSERVATION is reasonable and necessary because the patient's presenting complaints in the context of their chronic conditions represent sufficient risk of  deterioration or significant morbidity to constitute reasonable grounds for close observation in the hospital setting, but that the patient may be medically stable for discharge from the hospital within 24 to 48 hours.    Medical decision making: Patient seen at 8:00 PM on 05/06/2017.  The patient was discussed with Dr. Lacinda Axon.  What exists of the patient's chart was reviewed in depth and summarized above.  Clinical condition: stable.        Edwin Dada [facility] Pager [phone]

## 2017-05-07 NOTE — Procedures (Signed)
Bronchoscopy Procedure Note [name] [id number] 06/22/1971  Procedure: Bronchoscopy Indications: Diagnostic evaluation of the airways  Procedure Details Consent: Risks of procedure as well as the alternatives and risks of each were explained to the (patient/caregiver).  Consent for procedure obtained. Time Out: Verified patient identification, verified procedure, site/side was marked, verified correct patient position, special equipment/implants available, medications/allergies/relevent history reviewed, required imaging and test results available.  Performed  In preparation for procedure, patient was given 100% FiO2 and bronchoscope lubricated. Sedation: NONE  Airway entered and the following bronchi were examined: Bronchi.   Procedures performed: Brushings performed - none Bronchoscope removed.  , Patient placed back on 100% FiO2 at conclusion of procedure.    Evaluation Hemodynamic Status: BP stable throughout; O2 sats: stable throughout Patient's Current Condition: stable Specimens:  None Complications: No apparent complications Patient did tolerate procedure well.   [name]. 05/07/2017 \  1. Hard dry semi bloody secretions all over posterior wall at exit of trach and on carina 2. Saline lavage cleared overall without any granulation tissue or ulceration noted 3. The trach exits 2 cm from carina 4. The trach is 10 cm in length 5. dod not enter mainstem's, they appeared normal 6. Of course I am unable to see above trach exit site 7. No active bleeding, some erythema at carina from suction trauma likely   [name]. Brittney Mould, MD, [place] Pgr: [facility]

## 2017-05-07 NOTE — Consult Note (Signed)
Name: [name] MRN: [id number] DOB: 1970/10/27    ADMISSION DATE:  05/06/2017 CONSULTATION DATE:  8/16  REFERRING MD :  Dr. Starla Link  CHIEF COMPLAINT:  Dyspnea  BRIEF PATIENT DESCRIPTION: 46 year old trach dependent female with OSA/asthma admitted 8/15 for exacerbation.   SIGNIFICANT EVENTS    STUDIES:  CTA chest 8/16 > non-acute   HISTORY OF PRESENT ILLNESS:  46 year old female with PMH as below, which is significant for OSA, Asthma, DM, and HTN. She is trach dependent following an intubation for asthma resulting in an intubation injury. ENT at [facility] did not feel as though they could safely establish an adequate airway and she was transferred to [facility]. There, ENT performed direct laryngoscopy, bronchoscopy, and trach (#6 DCT) on 6/17. In OR, they found glottis to be extremely edematous, fibrinous debris in posterior glottis and lining the trachea, and trachea very soft and necrotic anteriorly. Tracheostomy was difficult 2/2 patient's anatomy. [name] was changed to 6 Shiley at [place]. 8/2 at ENT visit her laryngeal and tracheal exam was much improved. She was felt to potentially be candidate for decannulation in the near future.  She presented to [facility] ED 8/15 with complaints of dyspnea x 3 day. Dyspnea accompanied by some blood tinged "pink frothy" sputum and R sided chest pain. She was admitted for asthma exacerbation vs acute bronchitis and treated with IV steroids, nebulized bronchodilators, and IV antibiotics. PCCM asked to see.   PAST MEDICAL HISTORY :   has a past medical history of Arthritis; Asthma; Chronic back pain; Chronic chest pain; Depression; DM (diabetes mellitus) ([place]); GERD (gastroesophageal reflux disease); Headache(784.0); Hypertension; and Hypokalemia.  has a past surgical history that includes Cesarean section; Appendectomy; Vesicovaginal fistula closure w/ TAH (2009); Hernia repair; Cholecystectomy (N/A, 03/02/2014); Rotator cuff repair; and  Panendoscopy (N/A, 03/04/2017). Prior to Admission medications   Medication Sig Start Date End Date Taking? Authorizing Provider  albuterol (PROVENTIL HFA;VENTOLIN HFA) 108 (90 Base) MCG/ACT inhaler Inhale 2 puffs into the lungs every 6 (six) hours as needed for wheezing or shortness of breath. 04/28/17  Yes Arnoldo Morale, MD  albuterol (PROVENTIL) (2.5 MG/3ML) 0.083% nebulizer solution Take 3 mLs (2.5 mg total) by nebulization every 6 (six) hours as needed for wheezing or shortness of breath. Reported on 02/22/2016 04/28/17  Yes Arnoldo Morale, MD  amLODipine (NORVASC) 10 MG tablet Take 1 tablet (10 mg total) by mouth daily. 04/28/17  Yes Arnoldo Morale, MD  atorvastatin (LIPITOR) 20 MG tablet Take 1 tablet (20 mg total) by mouth daily. 04/28/17  Yes Arnoldo Morale, MD  carvedilol (COREG) 6.25 MG tablet Take 1 tablet (6.25 mg total) by mouth 2 (two) times daily with a meal. 04/28/17  Yes Amao, Enobong, MD  chlorhexidine gluconate, MEDLINE KIT, (PERIDEX) 0.12 % solution 15 mLs by Mouth Rinse route 2 (two) times daily. 03/05/17  Yes Jennelle Human B, NP  Dulaglutide 1.5 MG/0.5ML SOPN Inject 1.5 mg into the skin. 03/18/17  Yes [provider]  enoxaparin (LOVENOX) 40 MG/0.4ML injection Inject 40 mg into the skin. 03/18/17  Yes [provider]  Fluticasone-Salmeterol (ADVAIR DISKUS) 250-50 MCG/DOSE AEPB Inhale 1 puff into the lungs 2 (two) times daily. 04/28/17  Yes Arnoldo Morale, MD  gabapentin (NEURONTIN) 300 MG capsule Take 1 capsule (300 mg total) by mouth 3 (three) times daily. 04/28/17  Yes Arnoldo Morale, MD  methocarbamol (ROBAXIN) 500 MG tablet Take 1 tablet (500 mg total) by mouth every 8 (eight) hours as needed for muscle spasms.  04/28/17  Yes Arnoldo Morale, MD  pantoprazole (PROTONIX) 40 MG tablet Take 40 mg by mouth. 03/19/17  Yes [provider]  sertraline (ZOLOFT) 100 MG tablet Place 1 tablet (100 mg total) into feeding tube daily. 04/28/17  Yes Arnoldo Morale, MD  topiramate (TOPAMAX) 100  MG tablet Take 1 tablet (100 mg total) by mouth 2 (two) times daily. 04/28/17  Yes Arnoldo Morale, MD   Allergies  Allergen Reactions  . Reglan [Metoclopramide] Other (See Comments)    Panic attack    FAMILY HISTORY:  family history includes Arthritis in her mother; Diabetes in her brother, mother, and sister; Heart attack in her mother; Hypertension in her mother and sister; Seizures in her brother; Stroke in her father and mother. SOCIAL HISTORY:  reports that she has quit smoking. Her smoking use included Cigarettes. She has a 5.50 pack-year smoking history. She has never used smokeless tobacco. She reports that she does not drink alcohol or use drugs.  REVIEW OF SYSTEMS:  Limited as patient unable to speak with tracheostomy. Bolds are positive  Constitutional: weight loss, gain, night sweats, Fevers, chills, fatigue .  HEENT: headaches, Sore throat, sneezing, nasal congestion, post nasal drip, Difficulty swallowing, Tooth/dental problems, visual complaints visual changes, ear ache CV:  chest pain, radiates:,Orthopnea, PND, swelling in lower extremities, dizziness, palpitations, syncope.  GI  heartburn, indigestion, abdominal pain, nausea, vomiting, diarrhea, change in bowel habits, loss of appetite, bloody stools.  Resp: cough, productive: , hemoptysis, dyspnea, chest pain, pleuritic.  Skin: rash or itching or icterus GU: dysuria, change in color of urine, urgency or frequency. flank pain, hematuria  MS: joint pain or swelling. decreased range of motion  Psych: change in mood or affect. depression or anxiety.  Neuro: difficulty with speech, weakness, numbness, ataxia   SUBJECTIVE:   VITAL SIGNS: Temp:  [97.3 F (36.3 C)-98.9 F (37.2 C)] 97.3 F (36.3 C) (08/16 1126) Pulse Rate:  [72-124] 72 (08/16 1126) Resp:  [13-29] 22 (08/16 1126) BP: (104-158)/(58-134) 134/83 (08/16 1126) SpO2:  [98 %-100 %] 99 % (08/16 1126) FiO2 (%):  [28 %] 28 % (08/16 0807) Weight:  [104.4 kg (230 lb  2.6 oz)] 104.4 kg (230 lb 2.6 oz) (08/16 0507)  PHYSICAL EXAMINATION: General:  Obese female in NAD Neuro:  Alert, oriented, non-focal HEENT:  [place]/AT, PERRL, no JVD. Trach in place, dressing CDI Cardiovascular:  RRR, no MRG Lungs:  CTA Abdomen:  Soft, non-tender, non-distneded Musculoskeletal:  No acute deformity or ROM limitation Skin:  Grossly intact   Recent Labs Lab 05/06/17 1706 05/07/17 0533  NA 139 136  K 3.3* 3.6  CL 107 105  CO2 20* 17*  BUN 8 6  CREATININE 1.09* 1.08*  GLUCOSE 274* 307*    Recent Labs Lab 05/06/17 1706 05/07/17 0533  HGB 10.7* 10.3*  HCT 33.1* 31.3*  WBC 11.4* 10.1  PLT 396 370   Ct Soft Tissue Neck W Contrast  Result Date: 05/06/2017 CLINICAL DATA:  Chronic laryngitis EXAM: CT NECK WITH CONTRAST TECHNIQUE: Multidetector CT imaging of the neck was performed using the standard protocol following the bolus administration of intravenous contrast. CONTRAST:  100 mL Isovue 370 COMPARISON:  CT neck 03/04/2017 FINDINGS: Pharynx and larynx: --Nasopharynx: Fossae of Rosenmuller are clear. Normal adenoid tonsils for age. --Oral cavity and oropharynx: The palatine and lingual tonsils are normal. The visible oral cavity and floor of mouth are normal. Small torus palatini. --Hypopharynx: Normal vallecula and pyriform sinuses. --Larynx: Normal epiglottis and pre-epiglottic space. Normal aryepiglottic and  vocal folds. Status post tracheostomy with tube tip within the trachea at the level of the clavicular heads. --Retropharyngeal space: No abscess, effusion or lymphadenopathy. Salivary glands: --Parotid: No mass lesion or inflammation. No sialolithiasis or ductal dilatation. --Submandibular: Symmetric without inflammation. No sialolithiasis or ductal dilatation. --Sublingual: Normal. No ranula or other visible lesion of the base of tongue and floor of mouth. Thyroid: Normal. Lymph nodes: No enlarged or abnormal density lymph nodes. Vascular: Major cervical vessels are  patent. Limited intracranial: Normal. Visualized orbits: Normal. Mastoids and visualized paranasal sinuses: No fluid levels or advanced mucosal thickening. No mastoid effusion. Skeleton: No bony spinal canal stenosis. No lytic or blastic lesions. Upper chest: Clear. Other: None. IMPRESSION: 1. Status post tracheostomy with normal positioning of respiratory tube. 2. No focal pharyngeal or laryngeal abnormality. Electronically Signed   By: Ulyses Jarred M.D.   On: 05/06/2017 21:50   Ct Angio Chest Pe W And/or Wo Contrast  Result Date: 05/06/2017 CLINICAL DATA:  Shortness of breath and hemoptysis EXAM: CT ANGIOGRAPHY CHEST WITH CONTRAST TECHNIQUE: Multidetector CT imaging of the chest was performed using the standard protocol during bolus administration of intravenous contrast. Multiplanar CT image reconstructions and MIPs were obtained to evaluate the vascular anatomy. CONTRAST:  100 mL Isovue 370 COMPARISON:  Chest CT 03/04/2017 FINDINGS: Cardiovascular: Contrast injection is sufficient to demonstrate satisfactory opacification of the pulmonary arteries to the segmental level. There is no pulmonary embolus. The main pulmonary artery is within normal limits for size. There is no CT evidence of acute right heart strain. The visualized aorta is normal. There is a normal variant aortic arch branching pattern with the brachiocephalic and left common carotid arteries sharing a common origin. Heart size is normal, without pericardial effusion. Mediastinum/Nodes: No mediastinal, hilar or axillary lymphadenopathy. The visualized thyroid and thoracic esophageal course are unremarkable. Lungs/Pleura: Tracheostomy tube tip at the level of the clavicular heads. No pulmonary nodules or masses. No pleural effusion or pneumothorax. No focal airspace consolidation. No focal pleural abnormality. Upper Abdomen: Contrast bolus timing is not optimized for evaluation of the abdominal organs. Within this limitation, the visualized  organs of the upper abdomen are normal. Musculoskeletal: No chest wall abnormality. No acute or significant osseous findings. Review of the MIP images confirms the above findings. IMPRESSION: 1. No pulmonary embolus or acute aortic syndrome. 2. No finding to explain the reported hemoptysis. Electronically Signed   By: Ulyses Jarred M.D.   On: 05/06/2017 22:07   Dg Chest Port 1 View  Result Date: 05/06/2017 CLINICAL DATA:  Shortness of breath EXAM: PORTABLE CHEST 1 VIEW COMPARISON:  Chest radiograph 03/06/2017 FINDINGS: Tracheostomy tube tip is in appropriate position at the level of the clavicles. The lungs are clear. Normal cardiomediastinal contours. IMPRESSION: No focal airspace disease. Electronically Signed   By: Ulyses Jarred M.D.   On: 05/06/2017 18:02    ASSESSMENT / PLAN:  Dyspnea: improved. Likely in the setting of asthma exacerbation +/- bronchitis as described by primary team.However, given history there is concern of tracheal stenosis. CTA negative for PE. Plan: -Continue steroids, nebs per primary -Agree DC Azithro -Will plan for bronchoscopy to assess for granulation tissue distal to tracheostomy.  -Supplemental O2 as needed to keep SpO2 > 92% -Continued follow up with [facility] ENT.  OSA: -Tracheostomy should be curative for this.  -If she is able to be decannulated she will need needs sleep study.  -If she remains dyspneic despite above therapies may need to check AM ABG to determine need for  nocturnal ventilation. She is at risk for OHS.  Rest per primary  Georgann Housekeeper, AGACNP-BC [facility] Pulmonology/Critical Care Pager [phone] or [phone]  05/07/2017 11:52 AM   STAFF NOTE: Linwood Dibbles, MD FACP have personally reviewed patient's available data, including medical history, events of note, physical examination and test results as part of my evaluation. I have discussed with resident/NP and other care providers such as pharmacist, RN and RRT. In addition, I  personally evaluated patient and elicited key findings OV:ZCHYI, alert texting on phone, neck with trach slight blood tinged secretions, lungs clear distant, no edema, abdo soft, CT I reviewed did not show PNA did not show PE, she does not report SOB now, she is NOT wheezing, I am mostly concerned about suction trauma vs granulation, it appears that she has had bronch assessment over a week ago with some findings of suction trauma then, she can have steroids dc , this appears to be an upper airway trach issue and not asthma and I do not see indication for abx for PNA not seen on CT or clinically, will proceed with bronch assessment at bedside, if further bronch needed into mainstem's would move to bronch lab for that, would like to avoid sedation I had extesnive time with pt and husband covering her histroy at [facility] personally obtaining this history UPDATE: Bronch done and showed dry hard secretions on post wall at exit of trach and carina, I was able to lavage this clear and do not see granulation or ulcerations, I measured the trach distance in total to exit is 10 cm (we should NOT suction past 10 cm ) and the carina is 2 cm from this exit (it is close), of course I cannot see whats behind the trach upper airway ( this is planned to be assess at [facility] in future with hopes to assess upper airway again for patency and healing and decanulation possible in future), for now we need to use humidification aggressive, ad mucomyst's x 2 days, continued nebs, keep airway moist, I updated husband and pt and RT on not suctioning past 10 cm, she may need to NOT use extra long trach in future as may be hitting carina at times ( not convinced of this), this would be best assessed in future and reluctant to change to NON extra long trach now as to avoid granulation above that may exist  Lavon Paganini. Titus Mould, MD, [place] Pgr: [facility] 05/07/2017 12:21 PM

## 2017-05-07 NOTE — Care Management Note (Signed)
Case Management Note  Patient Details  Name: [name] MRN: [id number] Date of Birth: 1971/06/30  Subjective/Objective:    Pt admitted with SOB                 Action/Plan:  PTA independent from home with husband.  Pt has been trach/trach collar dependent since 5/18.  Pt discharged from [facility] to [facility] CIR and then home - pt was home approximately 2 weeks PTA.  Pt receives trach supplies and home oxygen from [facility] and pt has portable oxygen tank.  CM will continue to follow for discharge needs   Expected Discharge Date:                  Expected Discharge Plan:  Home/Self Care  In-House Referral:     Discharge planning Services  CM Consult  Post Acute Care Choice:    Choice offered to:     DME Arranged:    DME Agency:     HH Arranged:    HH Agency:     Status of Service:     If discussed at [employer] of Stay Meetings, dates discussed:    Additional Comments:  Maryclare Labrador, RN 05/07/2017, 2:25 PM

## 2017-05-07 NOTE — Progress Notes (Signed)
RT NOTE:  Per MD order RT consulted with patient about OSA history and therapy @ home. Pt states she has a CPAP @ home but rarely uses the device. She now has trach from recent asthma exacerbation & prolonged ICU stay. Pt has not worn CPAP since before exacerbation on 02/18/17.

## 2017-05-07 NOTE — Progress Notes (Signed)
Assisted with bedside bronchoscopy, instilled 5-8cc saline and pt. Was suctioned for large amounts of thick dried secretions.  Tolerated procedure well.  Orders received from MD.

## 2017-05-08 DIAGNOSIS — E1142 Type 2 diabetes mellitus with diabetic polyneuropathy: Secondary | ICD-10-CM

## 2017-05-08 DIAGNOSIS — J9621 Acute and chronic respiratory failure with hypoxia: Principal | ICD-10-CM

## 2017-05-08 DIAGNOSIS — R042 Hemoptysis: Secondary | ICD-10-CM

## 2017-05-08 LAB — PROCALCITONIN: Procalcitonin: <0.1 ng/mL

## 2017-05-08 LAB — BASIC METABOLIC PANEL
Anion gap: 8 (ref 5–15)
BUN: 10 mg/dL (ref 6–20)
CHLORIDE: 107 mmol/L (ref 101–111)
CO2: 22 mmol/L (ref 22–32)
Calcium: 8.7 mg/dL — ABNORMAL LOW (ref 8.9–10.3)
Creatinine, Ser: 1 mg/dL (ref 0.44–1.00)
GFR calc Af Amer: >60 mL/min (ref >60)
GFR calc non Af Amer: >60 mL/min (ref >60)
GLUCOSE: 229 mg/dL — AB (ref 65–99)
POTASSIUM: 3.5 mmol/L (ref 3.5–5.1)
Sodium: 137 mmol/L (ref 135–145)

## 2017-05-08 LAB — CBC WITH DIFFERENTIAL/PLATELET
Basophils Absolute: 0 10*3/uL (ref 0.0–0.1)
Basophils Relative: 0 %
Eosinophils Absolute: 0 10*3/uL (ref 0.0–0.7)
Eosinophils Relative: 0 %
HCT: 27.1 % — ABNORMAL LOW (ref 36.0–46.0)
HEMOGLOBIN: 8.8 g/dL — AB (ref 12.0–15.0)
LYMPHS ABS: 4 10*3/uL (ref 0.7–4.0)
LYMPHS PCT: 25 %
MCH: 27.2 pg (ref 26.0–34.0)
MCHC: 32.5 g/dL (ref 30.0–36.0)
MCV: 83.6 fL (ref 78.0–100.0)
Monocytes Absolute: 1.3 10*3/uL — ABNORMAL HIGH (ref 0.1–1.0)
Monocytes Relative: 9 %
NEUTROS PCT: 66 %
Neutro Abs: 10.5 10*3/uL — ABNORMAL HIGH (ref 1.7–7.7)
Platelets: 335 10*3/uL (ref 150–400)
RBC: 3.24 MIL/uL — AB (ref 3.87–5.11)
RDW: 14.2 % (ref 11.5–15.5)
WBC: 15.8 10*3/uL — AB (ref 4.0–10.5)

## 2017-05-08 LAB — URINE CULTURE: Culture: <10000 — AB

## 2017-05-08 LAB — GLUCOSE, CAPILLARY
GLUCOSE-CAPILLARY: 187 mg/dL — AB (ref 65–99)
GLUCOSE-CAPILLARY: 201 mg/dL — AB (ref 65–99)

## 2017-05-08 LAB — HEMOGLOBIN A1C
Hgb A1c MFr Bld: 7.1 % — ABNORMAL HIGH (ref 4.8–5.6)
Mean Plasma Glucose: 157.07 mg/dL

## 2017-05-08 LAB — MAGNESIUM: Magnesium: 1.7 mg/dL (ref 1.7–2.4)

## 2017-05-08 MED ORDER — OXYCODONE HCL 5 MG PO TABS
5.0000 mg | ORAL_TABLET | Freq: Four times a day (QID) | ORAL | Status: DC | PRN
  Administered 2017-05-08: 5 mg via ORAL
  Filled 2017-05-08: qty 1

## 2017-05-08 MED ORDER — GUAIFENESIN-DM 100-10 MG/5ML PO SYRP: 10.0000 mL | ORAL_SOLUTION | ORAL | 1 refills | Status: DC | PRN

## 2017-05-08 MED ORDER — KETOROLAC TROMETHAMINE 15 MG/ML IJ SOLN
15.0000 mg | Freq: Once | INTRAMUSCULAR | Status: AC
  Administered 2017-05-08: 15 mg via INTRAVENOUS
  Filled 2017-05-08: qty 1

## 2017-05-08 MED ORDER — IBUPROFEN 200 MG PO TABS: 400.0000 mg | ORAL_TABLET | Freq: Four times a day (QID) | ORAL | 0 refills | Status: DC | PRN

## 2017-05-08 MED ORDER — SODIUM CHLORIDE 3 % IN NEBU: 4.0000 mL | INHALATION_SOLUTION | Freq: Two times a day (BID) | RESPIRATORY_TRACT | 0 refills | Status: DC

## 2017-05-08 MED ORDER — SODIUM CHLORIDE 3 % IN NEBU
4.0000 mL | INHALATION_SOLUTION | Freq: Two times a day (BID) | RESPIRATORY_TRACT | Status: DC
  Administered 2017-05-08: 4 mL via RESPIRATORY_TRACT
  Filled 2017-05-08: qty 4

## 2017-05-08 NOTE — Progress Notes (Signed)
Pt having increased chest discomfort/ soreness due to coughing. 37m Morphine given at 247. Pt requesting additional pain medication. Pt suctioned after coughing spell. Md paged. Once time does of Toradol 15mg  ordered. Will continue to monitor.

## 2017-05-08 NOTE — Consult Note (Signed)
Name: [name] MRN: [id number] DOB: Oct 12, 1970    ADMISSION DATE:  05/06/2017 CONSULTATION DATE:  8/16  REFERRING MD :  Dr. Starla Link  CHIEF COMPLAINT:  Dyspnea  BRIEF PATIENT DESCRIPTION: 46 year old trach dependent female with DM, HTN, OSA/asthma admitted 8/15 for exacerbation. She is trach dependent following an intubation for asthma resulting in an intubation injury. ENT at [facility] did not feel as though they could safely establish an adequate airway and she was transferred to [facility]. There, ENT performed direct laryngoscopy, bronchoscopy, and trach (#6 DCT) on 6/17. In OR, they found glottis to be extremely edematous, fibrinous debris in posterior glottis and lining the trachea, and trachea very soft and necrotic anteriorly. Tracheostomy was difficult 2/2 patient's anatomy. [name] was changed to 6 Shiley at [place]. 8/2 at ENT visit her laryngeal and tracheal exam was much improved. She was felt to potentially be candidate for decannulation in the near future.  She presented to [facility] ED 8/15 with complaints of dyspnea x 3 day. Dyspnea accompanied by some blood tinged "pink frothy" sputum and R sided chest pain. She was admitted for asthma exacerbation vs acute bronchitis and treated with IV steroids, nebulized bronchodilators, and IV antibiotics. PCCM asked to see.    SUBJECTIVE:  Pt reports feeling better overall.  Bleeding has slowed from trach.    VITAL SIGNS: Temp:  [98.5 F (36.9 C)-99.2 F (37.3 C)] 98.6 F (37 C) (08/17 0801) Pulse Rate:  [72-97] 72 (08/17 0736) Resp:  [18-25] 19 (08/17 0736) BP: (111-126)/(60-87) 111/87 (08/17 0404) SpO2:  [99 %-100 %] 100 % (08/17 0736) FiO2 (%):  [28 %-35 %] 28 % (08/17 0736)  PHYSICAL EXAMINATION: General: adult female in NAD, sitting in bed eating breakfast HEENT: MM pink/moist, #6 cuffed trach midline clean/dry/intact, small amt thin pink secretions at site Neuro: AAOx4, communicates appropriately  CV: s1s2 rrr, no  m/r/g PULM: even/non-labored, lungs bilaterally clear GY:JEHU, non-tender, bsx4 active  Extremities: warm/dry, no edema  Skin: no rashes or lesions   Recent Labs Lab 05/06/17 1706 05/07/17 0533 05/08/17 0550  NA 139 136 137  K 3.3* 3.6 3.5  CL 107 105 107  CO2 20* 17* 22  BUN 8 6 10   CREATININE 1.09* 1.08* 1.00  GLUCOSE 274* 307* 229*    Recent Labs Lab 05/06/17 1706 05/07/17 0533 05/08/17 0550  HGB 10.7* 10.3* 8.8*  HCT 33.1* 31.3* 27.1*  WBC 11.4* 10.1 15.8*  PLT 396 370 335   Ct Soft Tissue Neck W Contrast  Result Date: 05/06/2017 CLINICAL DATA:  Chronic laryngitis EXAM: CT NECK WITH CONTRAST TECHNIQUE: Multidetector CT imaging of the neck was performed using the standard protocol following the bolus administration of intravenous contrast. CONTRAST:  100 mL Isovue 370 COMPARISON:  CT neck 03/04/2017 FINDINGS: Pharynx and larynx: --Nasopharynx: Fossae of Rosenmuller are clear. Normal adenoid tonsils for age. --Oral cavity and oropharynx: The palatine and lingual tonsils are normal. The visible oral cavity and floor of mouth are normal. Small torus palatini. --Hypopharynx: Normal vallecula and pyriform sinuses. --Larynx: Normal epiglottis and pre-epiglottic space. Normal aryepiglottic and vocal folds. Status post tracheostomy with tube tip within the trachea at the level of the clavicular heads. --Retropharyngeal space: No abscess, effusion or lymphadenopathy. Salivary glands: --Parotid: No mass lesion or inflammation. No sialolithiasis or ductal dilatation. --Submandibular: Symmetric without inflammation. No sialolithiasis or ductal dilatation. --Sublingual: Normal. No ranula or other visible lesion of the base of tongue and floor of mouth. Thyroid: Normal. Lymph nodes: No enlarged  or abnormal density lymph nodes. Vascular: Major cervical vessels are patent. Limited intracranial: Normal. Visualized orbits: Normal. Mastoids and visualized paranasal sinuses: No fluid levels or  advanced mucosal thickening. No mastoid effusion. Skeleton: No bony spinal canal stenosis. No lytic or blastic lesions. Upper chest: Clear. Other: None. IMPRESSION: 1. Status post tracheostomy with normal positioning of respiratory tube. 2. No focal pharyngeal or laryngeal abnormality. Electronically Signed   By: Ulyses Jarred M.D.   On: 05/06/2017 21:50   Ct Angio Chest Pe W And/or Wo Contrast  Result Date: 05/06/2017 CLINICAL DATA:  Shortness of breath and hemoptysis EXAM: CT ANGIOGRAPHY CHEST WITH CONTRAST TECHNIQUE: Multidetector CT imaging of the chest was performed using the standard protocol during bolus administration of intravenous contrast. Multiplanar CT image reconstructions and MIPs were obtained to evaluate the vascular anatomy. CONTRAST:  100 mL Isovue 370 COMPARISON:  Chest CT 03/04/2017 FINDINGS: Cardiovascular: Contrast injection is sufficient to demonstrate satisfactory opacification of the pulmonary arteries to the segmental level. There is no pulmonary embolus. The main pulmonary artery is within normal limits for size. There is no CT evidence of acute right heart strain. The visualized aorta is normal. There is a normal variant aortic arch branching pattern with the brachiocephalic and left common carotid arteries sharing a common origin. Heart size is normal, without pericardial effusion. Mediastinum/Nodes: No mediastinal, hilar or axillary lymphadenopathy. The visualized thyroid and thoracic esophageal course are unremarkable. Lungs/Pleura: Tracheostomy tube tip at the level of the clavicular heads. No pulmonary nodules or masses. No pleural effusion or pneumothorax. No focal airspace consolidation. No focal pleural abnormality. Upper Abdomen: Contrast bolus timing is not optimized for evaluation of the abdominal organs. Within this limitation, the visualized organs of the upper abdomen are normal. Musculoskeletal: No chest wall abnormality. No acute or significant osseous findings.  Review of the MIP images confirms the above findings. IMPRESSION: 1. No pulmonary embolus or acute aortic syndrome. 2. No finding to explain the reported hemoptysis. Electronically Signed   By: Ulyses Jarred M.D.   On: 05/06/2017 22:07   Dg Chest Port 1 View  Result Date: 05/06/2017 CLINICAL DATA:  Shortness of breath EXAM: PORTABLE CHEST 1 VIEW COMPARISON:  Chest radiograph 03/06/2017 FINDINGS: Tracheostomy tube tip is in appropriate position at the level of the clavicles. The lungs are clear. Normal cardiomediastinal contours. IMPRESSION: No focal airspace disease. Electronically Signed   By: Ulyses Jarred M.D.   On: 05/06/2017 18:02   SIGNIFICANT EVENTS  8/15  Admit with SOB  8/16  PCCM consulted for SOB, hemoptysis  STUDIES:  CTA Chest 8/16 >> non-acute   ASSESSMENT / PLAN:  Dyspnea: improved. Likely in the setting of asthma exacerbation +/- bronchitis as described by primary team. CTA negative for PE.   Plan: Continue nebulized bronchodilators  Follow off antibiotics O2 as needed to support saturations > 92%   Chronic Tracheostomy with Suction Trauma - ? If there is granulation tissue behind trach in upper airway Hx Vocal Cord Trauma Plan:  Continue trach humidity  Minimize suction trauma, catheter should not go beyond 10 cm  Transition from mucomyst to nebulized saline, stop date in place Continue nebulized medications for airway moisture  Will need to follow up with [facility] ENT at discharge > she may need a regular trach to avoid potential of trauma / may be hitting the carina (in addition to the suction catheter)? Trach care per protocol    OSA: Plan: IF patient is de-cannulated, she will need a sleep study as she  likely has OSA   PCCM will sign off.  Please call back if new needs arise.   Noe Gens, NP-C [facility] Pgr: [phone] or if no answer [phone] 05/08/2017, 9:14 AM

## 2017-05-08 NOTE — Care Management Note (Signed)
Case Management Note  Patient Details  Name: [name] MRN: [id number] Date of Birth: 1971/08/18  Subjective/Objective:    Pt admitted with SOB                 Action/Plan:  PTA independent from home with husband.  Pt has been trach/trach collar dependent since 5/18.  Pt discharged from [facility] to [facility] and then home - pt was home approximately 2 weeks PTA.  Pt receives trach supplies and home oxygen from [facility] and pt has portable oxygen tank.  CM will continue to follow for discharge needs   Expected Discharge Date:  05/08/17               Expected Discharge Plan:  Home/Self Care  In-House Referral:     Discharge planning Services  CM Consult  Post Acute Care Choice:    Choice offered to:     DME Arranged:    DME Agency:     HH Arranged:    HH Agency:     Status of Service:     If discussed at [employer], dates discussed:    Additional Comments: Pt discharging home with husband.  Pt has oxygen for transport home and all needed supplies.  Husband was very appreciative of services provided at [facility] and denied any other needs. Maryclare Labrador, RN 05/08/2017, 2:13 PM

## 2017-05-08 NOTE — Discharge Summary (Signed)
Physician Discharge Summary  [name] TDH:[id number] DOB: 1971/07/06 DOA: 05/06/2017  PCP: Arnoldo Morale, MD  Admit date: 05/06/2017 Discharge date: 05/08/2017  Admitted From: Home Disposition: Home  Recommendations for Outpatient Follow-up:  1. Follow up with PCP in 1week 2. Follow-up with outpatient ENT at [facility] as scheduled 3. Follow-up with pulmonary/Dr. Melvyn Novas as scheduled 4. Tracheostomy care is as per outpatient ENT/pulmonary recommendations   Home Health: No  Equipment/Devices: Tracheostomy and oxygen  Discharge Condition: Stable  CODE STATUS: Full  Diet recommendation: Heart Healthy / Carb Modified    Brief/Interim Summary: 46 y.o.femalewith a past medical history significant for asthma, OSA noncompliant, HTN, and DM with recent unfortunate intubation for asthma resulting in intubation injury and now tracheostomy-dependentwho presented with cough and dyspnea. On presentation, patient was tachycardic, tachypneic with elevated lactic acid. She was started on broad-spectrum antibiotics for probable HCAP and Solu-Medrol for probable asthma extubation. Patient was evaluated by pulmonary who discontinued antibiotics and steroids after patient underwent bronchoscopy on 05/07/2017. Patient has been cleared for discharge by pulmonary.  Discharge Diagnoses:  Principal Problem:   Cough Active Problems:   Sleep apnea, obstructive   Asthma exacerbation   Essential hypertension   Type 2 diabetes mellitus with diabetic polyneuropathy, without long-term current use of insulin (HCC)   Chronic pain syndrome   Tracheostomy status (HCC)   Normocytic anemia   Hypokalemia   Sepsis (HCC)   Dyspnea due to Asthma exacerbation and/or bronchitis: - CTA chest ruled out PE, pneumonia. No evidence of neck swelling, innominate fistula on CT.  - Initially started on IV Solu-Medrol and broad-spectrum antibiotics which were discontinued after patient  underwent bronchoscopy by pulmonary on 05/07/2017  - Patient will be discharged home today with outpatient follow-up with pulmonary and ENT - Continue albuterol and Advair at home and oxygen supplementation   Acute on chronic hypoxic respiratory failure - Status post bronchoscopy on 05/07/2017 by pulmonary. Continue oxygen supplementation via trach - Outpatient follow-up with pulmonary and ENT  Elevated lactic acid: - Probably from hypoxia. Sepsis has been ruled out. Monitor off antibiotics.   Hyperetnsion: -Continue amlodipine and beta blocker -Continue statin  4. Diabetes: -Continue Trulicty -Patient follow-up  5. Hypokalemia: Improved  6. Tracheostomy in place: Do not suspect complications. Out patient follow-up with ENT at [facility]    7. Normocytic anemia: Mild, stable  8. Chronic pain/migraines: -Continue topiramate -Continue gabapentin, Robaxin -Continue sertraline  9. Sleep apnea: -Outpatient follow-up with pulmonary    Discharge Instructions  Discharge Instructions    Call MD for:  difficulty breathing, headache or visual disturbances    Complete by:  As directed    Call MD for:  extreme fatigue    Complete by:  As directed    Call MD for:  hives    Complete by:  As directed    Call MD for:  persistant dizziness or light-headedness    Complete by:  As directed    Call MD for:  persistant nausea and vomiting    Complete by:  As directed    Call MD for:  severe uncontrolled pain    Complete by:  As directed    Call MD for:  temperature >100.4    Complete by:  As directed    Diet - low sodium heart healthy    Complete by:  As directed    Diet Carb Modified    Complete by:  As directed    Increase activity slowly  Complete by:  As directed      Allergies as of 05/08/2017      Reactions   Reglan [metoclopramide] Other (See Comments)   Panic attack      Medication List    STOP taking these medications    enoxaparin 40 MG/0.4ML injection Commonly known as:  LOVENOX     TAKE these medications   albuterol (2.5 MG/3ML) 0.083% nebulizer solution Commonly known as:  PROVENTIL Take 3 mLs (2.5 mg total) by nebulization every 6 (six) hours as needed for wheezing or shortness of breath. Reported on 02/22/2016   albuterol 108 (90 Base) MCG/ACT inhaler Commonly known as:  PROVENTIL HFA;VENTOLIN HFA Inhale 2 puffs into the lungs every 6 (six) hours as needed for wheezing or shortness of breath.   amLODipine 10 MG tablet Commonly known as:  NORVASC Take 1 tablet (10 mg total) by mouth daily.   atorvastatin 20 MG tablet Commonly known as:  LIPITOR Take 1 tablet (20 mg total) by mouth daily.   carvedilol 6.25 MG tablet Commonly known as:  COREG Take 1 tablet (6.25 mg total) by mouth 2 (two) times daily with a meal.   chlorhexidine gluconate (MEDLINE KIT) 0.12 % solution Commonly known as:  PERIDEX 15 mLs by Mouth Rinse route 2 (two) times daily.   Dulaglutide 1.5 MG/0.5ML Sopn Inject 1.5 mg into the skin.   Fluticasone-Salmeterol 250-50 MCG/DOSE Aepb Commonly known as:  ADVAIR DISKUS Inhale 1 puff into the lungs 2 (two) times daily.   gabapentin 300 MG capsule Commonly known as:  NEURONTIN Take 1 capsule (300 mg total) by mouth 3 (three) times daily.   guaiFENesin-dextromethorphan 100-10 MG/5ML syrup Commonly known as:  ROBITUSSIN DM Take 10 mLs by mouth every 4 (four) hours as needed for cough.   ibuprofen 200 MG tablet Commonly known as:  MOTRIN IB Take 2 tablets (400 mg total) by mouth every 6 (six) hours as needed.   methocarbamol 500 MG tablet Commonly known as:  ROBAXIN Take 1 tablet (500 mg total) by mouth every 8 (eight) hours as needed for muscle spasms.   pantoprazole 40 MG tablet Commonly known as:  PROTONIX Take 40 mg by mouth.   sertraline 100 MG tablet Commonly known as:  ZOLOFT Place 1 tablet (100 mg total) into feeding tube daily.   sodium chloride  HYPERTONIC 3 % nebulizer solution Take 4 mLs by nebulization 2 (two) times daily.   topiramate 100 MG tablet Commonly known as:  TOPAMAX Take 1 tablet (100 mg total) by mouth 2 (two) times daily.      Follow-up Information    Arnoldo Morale, MD Follow up in 1 week(s).   Specialty:  Family Medicine Contact information: [place] [address] [phone]        Tanda Rockers, MD Follow up.   Specialty:  Pulmonary Disease Why:  as scheduled Contact information: [address] [address] [phone]          Allergies  Allergen Reactions  . Reglan [Metoclopramide] Other (See Comments)    Panic attack    Consultations:  Pulmonary  On-call ENT was consulted on phone by admitting hospitalist   Procedures/Studies: Ct Soft Tissue Neck W Contrast  Result Date: 05/06/2017 CLINICAL DATA:  Chronic laryngitis EXAM: CT NECK WITH CONTRAST TECHNIQUE: Multidetector CT imaging of the neck was performed using the standard protocol following the bolus administration of intravenous contrast. CONTRAST:  100 mL Isovue 370 COMPARISON:  CT neck 03/04/2017  FINDINGS: Pharynx and larynx: --Nasopharynx: Fossae of Rosenmuller are clear. Normal adenoid tonsils for age. --Oral cavity and oropharynx: The palatine and lingual tonsils are normal. The visible oral cavity and floor of mouth are normal. Small torus palatini. --Hypopharynx: Normal vallecula and pyriform sinuses. --Larynx: Normal epiglottis and pre-epiglottic space. Normal aryepiglottic and vocal folds. Status post tracheostomy with tube tip within the trachea at the level of the clavicular heads. --Retropharyngeal space: No abscess, effusion or lymphadenopathy. Salivary glands: --Parotid: No mass lesion or inflammation. No sialolithiasis or ductal dilatation. --Submandibular: Symmetric without inflammation. No sialolithiasis or ductal dilatation. --Sublingual: Normal. No ranula or other visible lesion of  the base of tongue and floor of mouth. Thyroid: Normal. Lymph nodes: No enlarged or abnormal density lymph nodes. Vascular: Major cervical vessels are patent. Limited intracranial: Normal. Visualized orbits: Normal. Mastoids and visualized paranasal sinuses: No fluid levels or advanced mucosal thickening. No mastoid effusion. Skeleton: No bony spinal canal stenosis. No lytic or blastic lesions. Upper chest: Clear. Other: None. IMPRESSION: 1. Status post tracheostomy with normal positioning of respiratory tube. 2. No focal pharyngeal or laryngeal abnormality. Electronically Signed   By: Ulyses Jarred M.D.   On: 05/06/2017 21:50   Ct Angio Chest Pe W And/or Wo Contrast  Result Date: 05/06/2017 CLINICAL DATA:  Shortness of breath and hemoptysis EXAM: CT ANGIOGRAPHY CHEST WITH CONTRAST TECHNIQUE: Multidetector CT imaging of the chest was performed using the standard protocol during bolus administration of intravenous contrast. Multiplanar CT image reconstructions and MIPs were obtained to evaluate the vascular anatomy. CONTRAST:  100 mL Isovue 370 COMPARISON:  Chest CT 03/04/2017 FINDINGS: Cardiovascular: Contrast injection is sufficient to demonstrate satisfactory opacification of the pulmonary arteries to the segmental level. There is no pulmonary embolus. The main pulmonary artery is within normal limits for size. There is no CT evidence of acute right heart strain. The visualized aorta is normal. There is a normal variant aortic arch branching pattern with the brachiocephalic and left common carotid arteries sharing a common origin. Heart size is normal, without pericardial effusion. Mediastinum/Nodes: No mediastinal, hilar or axillary lymphadenopathy. The visualized thyroid and thoracic esophageal course are unremarkable. Lungs/Pleura: Tracheostomy tube tip at the level of the clavicular heads. No pulmonary nodules or masses. No pleural effusion or pneumothorax. No focal airspace consolidation. No focal pleural  abnormality. Upper Abdomen: Contrast bolus timing is not optimized for evaluation of the abdominal organs. Within this limitation, the visualized organs of the upper abdomen are normal. Musculoskeletal: No chest wall abnormality. No acute or significant osseous findings. Review of the MIP images confirms the above findings. IMPRESSION: 1. No pulmonary embolus or acute aortic syndrome. 2. No finding to explain the reported hemoptysis. Electronically Signed   By: Ulyses Jarred M.D.   On: 05/06/2017 22:07   Dg Chest Port 1 View  Result Date: 05/06/2017 CLINICAL DATA:  Shortness of breath EXAM: PORTABLE CHEST 1 VIEW COMPARISON:  Chest radiograph 03/06/2017 FINDINGS: Tracheostomy tube tip is in appropriate position at the level of the clavicles. The lungs are clear. Normal cardiomediastinal contours. IMPRESSION: No focal airspace disease. Electronically Signed   By: Ulyses Jarred M.D.   On: 05/06/2017 18:02    Bronchoscopy on 05/07/2017   Subjective: Patient seen and examined at bedside. She feels better although she still having intermittent cough. No Overnight fever or vomiting.  Discharge Exam: Vitals:   05/08/17 0801 05/08/17 1125  BP: 114/63   Pulse: 64 96  Resp: 16 (!) 24  Temp: 98.6 F (37 C)  SpO2: 100% 100%   Vitals:   05/08/17 0735 05/08/17 0736 05/08/17 0801 05/08/17 1125  BP:   114/63   Pulse:  72 64 96  Resp:  19 16 (!) 24  Temp:   98.6 F (37 C)   TempSrc:   Oral   SpO2: 100% 100% 100% 100%  Weight:      Height:        General: Pt is alert, awake, not in acute distress; tracheostomy is in place Cardiovascular: Rate controlled, S1/S2 + Respiratory: Bilateral decreased breath sounds at bases with some scattered crackles Abdominal: Soft, NT, ND, bowel sounds + Extremities: no edema, no cyanosis    The results of significant diagnostics from this hospitalization (including imaging, microbiology, ancillary and laboratory) are listed below for reference.      Microbiology: Recent Results (from the past 240 hour(s))  Blood culture (routine x 2)     Status: None (Preliminary result)   Collection Time: 05/06/17  5:35 PM  Result Value Ref Range Status   Specimen Description BLOOD LEFT ANTECUBITAL  Final   Special Requests   Final    BOTTLES DRAWN AEROBIC AND ANAEROBIC Blood Culture adequate volume   Culture NO GROWTH < 24 HOURS  Final   Report Status PENDING  Incomplete  Blood culture (routine x 2)     Status: None (Preliminary result)   Collection Time: 05/06/17  5:40 PM  Result Value Ref Range Status   Specimen Description BLOOD RIGHT ANTECUBITAL  Final   Special Requests   Final    BOTTLES DRAWN AEROBIC AND ANAEROBIC Blood Culture adequate volume   Culture NO GROWTH < 24 HOURS  Final   Report Status PENDING  Incomplete  Culture, Urine     Status: Abnormal   Collection Time: 05/07/17 12:38 AM  Result Value Ref Range Status   Specimen Description URINE, RANDOM  Final   Special Requests NONE  Final   Culture <10,000 COLONIES/mL INSIGNIFICANT GROWTH (A)  Final   Report Status 05/08/2017 FINAL  Final  MRSA PCR Screening     Status: None   Collection Time: 05/07/17  5:09 AM  Result Value Ref Range Status   MRSA by PCR NEGATIVE NEGATIVE Final    Comment:        The GeneXpert MRSA Assay (FDA approved for NASAL specimens only), is one component of a comprehensive MRSA colonization surveillance program. It is not intended to diagnose MRSA infection nor to guide or monitor treatment for MRSA infections.      Labs: BNP (last 3 results)  Recent Labs  02/19/17 0219  BNP 55.7   Basic Metabolic Panel:  Recent Labs Lab 05/06/17 1706 05/06/17 2127 05/07/17 0533 05/08/17 0550  NA 139  --  136 137  K 3.3*  --  3.6 3.5  CL 107  --  105 107  CO2 20*  --  17* 22  GLUCOSE 274*  --  307* 229*  BUN 8  --  6 10  CREATININE 1.09*  --  1.08* 1.00  CALCIUM 9.0  --  8.6* 8.7*  MG  --  1.4*  --  1.7   Liver Function  Tests:  Recent Labs Lab 05/06/17 1706  AST 26  ALT 24  ALKPHOS 106  BILITOT 0.4  PROT 7.5  ALBUMIN 3.6   No results for input(s): LIPASE, AMYLASE in the last 168 hours. No results for input(s): AMMONIA in the last 168 hours. CBC:  Recent Labs Lab 05/06/17 1706 05/07/17 0533  05/08/17 0550  WBC 11.4* 10.1 15.8*  NEUTROABS 4.8  --  10.5*  HGB 10.7* 10.3* 8.8*  HCT 33.1* 31.3* 27.1*  MCV 83.8 82.4 83.6  PLT 396 370 335   Cardiac Enzymes: No results for input(s): CKTOTAL, CKMB, CKMBINDEX, TROPONINI in the last 168 hours. BNP: Invalid input(s): POCBNP CBG:  Recent Labs Lab 05/07/17 0758 05/07/17 1217 05/07/17 1613 05/07/17 2138 05/08/17 0759  GLUCAP 358* 296* 334* 364* 187*   D-Dimer No results for input(s): DDIMER in the last 72 hours. Hgb A1c  Recent Labs  05/08/17 0550  HGBA1C 7.1*   Lipid Profile No results for input(s): CHOL, HDL, LDLCALC, TRIG, CHOLHDL, LDLDIRECT in the last 72 hours. Thyroid function studies No results for input(s): TSH, T4TOTAL, T3FREE, THYROIDAB in the last 72 hours.  Invalid input(s): FREET3 Anemia work up No results for input(s): VITAMINB12, FOLATE, FERRITIN, TIBC, IRON, RETICCTPCT in the last 72 hours. Urinalysis    Component Value Date/Time   COLORURINE STRAW (A) 05/07/2017 0038   APPEARANCEUR CLEAR 05/07/2017 0038   LABSPEC 1.038 (H) 05/07/2017 0038   PHURINE 6.0 05/07/2017 0038   GLUCOSEU NEGATIVE 05/07/2017 0038   HGBUR NEGATIVE 05/07/2017 0038   BILIRUBINUR NEGATIVE 05/07/2017 0038   BILIRUBINUR neg 10/29/2015 1256   KETONESUR NEGATIVE 05/07/2017 0038   PROTEINUR NEGATIVE 05/07/2017 0038   UROBILINOGEN 0.2 10/29/2015 1256   UROBILINOGEN 0.2 11/18/2014 2325   NITRITE NEGATIVE 05/07/2017 0038   LEUKOCYTESUR NEGATIVE 05/07/2017 0038   Sepsis Labs Invalid input(s): PROCALCITONIN,  WBC,  LACTICIDVEN Microbiology Recent Results (from the past 240 hour(s))  Blood culture (routine x 2)     Status: None (Preliminary  result)   Collection Time: 05/06/17  5:35 PM  Result Value Ref Range Status   Specimen Description BLOOD LEFT ANTECUBITAL  Final   Special Requests   Final    BOTTLES DRAWN AEROBIC AND ANAEROBIC Blood Culture adequate volume   Culture NO GROWTH < 24 HOURS  Final   Report Status PENDING  Incomplete  Blood culture (routine x 2)     Status: None (Preliminary result)   Collection Time: 05/06/17  5:40 PM  Result Value Ref Range Status   Specimen Description BLOOD RIGHT ANTECUBITAL  Final   Special Requests   Final    BOTTLES DRAWN AEROBIC AND ANAEROBIC Blood Culture adequate volume   Culture NO GROWTH < 24 HOURS  Final   Report Status PENDING  Incomplete  Culture, Urine     Status: Abnormal   Collection Time: 05/07/17 12:38 AM  Result Value Ref Range Status   Specimen Description URINE, RANDOM  Final   Special Requests NONE  Final   Culture <10,000 COLONIES/mL INSIGNIFICANT GROWTH (A)  Final   Report Status 05/08/2017 FINAL  Final  MRSA PCR Screening     Status: None   Collection Time: 05/07/17  5:09 AM  Result Value Ref Range Status   MRSA by PCR NEGATIVE NEGATIVE Final    Comment:        The GeneXpert MRSA Assay (FDA approved for NASAL specimens only), is one component of a comprehensive MRSA colonization surveillance program. It is not intended to diagnose MRSA infection nor to guide or monitor treatment for MRSA infections.      Time coordinating discharge: 35 minutes  SIGNED:   Aline August, MD  [facility] 05/08/2017, 11:48 AM Pager: [phone]  If 7PM-7AM, please contact night-coverage [email or web address] Password [email or web address]

## 2017-05-11 ENCOUNTER — Telehealth: Payer: Self-pay

## 2017-05-11 ENCOUNTER — Other Ambulatory Visit: Payer: Self-pay | Admitting: Family Medicine

## 2017-05-11 LAB — CULTURE, BLOOD (ROUTINE X 2)
Culture: NO GROWTH
Culture: NO GROWTH
SPECIAL REQUESTS: ADEQUATE
Special Requests: ADEQUATE

## 2017-05-11 MED ORDER — TIZANIDINE HCL 4 MG PO TABS: 4.0000 mg | ORAL_TABLET | Freq: Three times a day (TID) | ORAL | 1 refills | Status: DC | PRN

## 2017-05-11 NOTE — Telephone Encounter (Signed)
[facility] Post-discharge Follow-Up Phone Call:  Date of Discharge:  05/08/17 Principal Discharge Diagnosis(es):  Dyspnea due to asthma exacerbation, acute on chronic hypoxic respiratory failure, DM, HTN Post-discharge Communication: (Clearly document all attempts clearly and date contact made) call placed to the patient. Her husband, [name], answered and stated she was with him.  DPR on file for husband, [name] Call Completed: Yes                    With [place] -  He was agreeable to having the patient follow up with the TCC and Dr Jarold Song.  She has 5 hospital admissions since 09/22/2016.  Dr Jarold Song is her PCP.     Please check all that apply:  X  Patient is knowledgeable of his/her condition(s) and/or treatment. ? Patient is caring for self at home.  X  Patient is receiving assist at home from family and/or caregiver. Family and/or caregiver is knowledgeable of patient's condition(s) and/or treatment. - the patient lives with her husband and her daughter.  He also noted that there are a lot of other family/friends in the area to assist if needed. [name] stated that he does the meal preparation. Family provides transportation to the medical appointments. [name] said that she is registered with medicaid transportation but does not have to use it.  X  Patient is receiving home health services. If so, name of agency. [facility] nursing.      Medication Reconciliation:  ? Medication list reviewed with patient. X  Patient obtained all discharge medications. If not, why? - [name] stated that she has all of her medications but he did not know where the list is to review. He stated that she is not taking the lovenox. He also noted that she is independent with managing her medications and takes them as ordered and said that he did not have any questions about her medication regime.    Activities of Daily Living:  ? Independent X  Needs assist (describe; ? home DME used) - has  a trach and home O2.    Trache supplies and O2 provided  by [facility].  Her husband stated that she ambulates without an assistive device. He stated that he assists her with personal care if needed as she fatigues easily.  She has used the O2 @ 4L/min ? [facility] (describe, ? home DME used)   Community resources in place for patient:  ? None  X  Home Health/Home DME - [facility] ? Assisted Living ? Support Group.        Questions/Concerns discussed: [name] stated that the only problem that she has now is pain in her back and around the trach site. He noted that oxycodone was prescribed in the hospital but she doesn't have anything now.  Informed him that this CM would notify Dr Jarold Song.   An appointment was scheduled for 05/14/17 @ 0945. [name] stated that she would have transportation. No other questions/concerns at this time.   Message sent to Dr Jarold Song regarding the oxycodone.

## 2017-05-13 ENCOUNTER — Telehealth: Payer: Self-pay

## 2017-05-13 NOTE — Telephone Encounter (Signed)
Call placed to the patient to remind her of her TCC appointment tomorrow - 8/23/187 @ 0945. Her granddaughter, Peggyann Shoals was with her and spoke for her. She stated that she would let us know if she is not able to make the appointment. She has an appointment today with her doctor regarding her trach.   This CM also informed the patient that Dr Jarold Song was notified of her complaint of back pain and pain around her trach. Dr Jarold Song noted that she had prescribed tizanidine for her recently and will discuss further at her appointment.   [facility] appreciated the information.

## 2017-05-14 ENCOUNTER — Other Ambulatory Visit: Payer: Self-pay | Admitting: Pharmacist

## 2017-05-14 ENCOUNTER — Encounter: Payer: Self-pay | Admitting: Family Medicine

## 2017-05-14 ENCOUNTER — Other Ambulatory Visit: Payer: Self-pay | Admitting: Family Medicine

## 2017-05-14 ENCOUNTER — Ambulatory Visit: Payer: Medicare Other | Admitting: Family Medicine

## 2017-05-14 ENCOUNTER — Ambulatory Visit: Payer: Medicare Other | Attending: Family Medicine | Admitting: Family Medicine

## 2017-05-14 VITALS — BP 139/93 | HR 97 | Temp 98.6°F | Ht 61.0 in | Wt 218.4 lb

## 2017-05-14 DIAGNOSIS — G8929 Other chronic pain: Secondary | ICD-10-CM | POA: Insufficient documentation

## 2017-05-14 DIAGNOSIS — F329 Major depressive disorder, single episode, unspecified: Secondary | ICD-10-CM | POA: Insufficient documentation

## 2017-05-14 DIAGNOSIS — E1142 Type 2 diabetes mellitus with diabetic polyneuropathy: Secondary | ICD-10-CM | POA: Diagnosis not present

## 2017-05-14 DIAGNOSIS — M549 Dorsalgia, unspecified: Secondary | ICD-10-CM | POA: Diagnosis not present

## 2017-05-14 DIAGNOSIS — I10 Essential (primary) hypertension: Secondary | ICD-10-CM | POA: Insufficient documentation

## 2017-05-14 DIAGNOSIS — R05 Cough: Secondary | ICD-10-CM | POA: Diagnosis not present

## 2017-05-14 DIAGNOSIS — Z930 Tracheostomy status: Secondary | ICD-10-CM | POA: Diagnosis not present

## 2017-05-14 DIAGNOSIS — Z7951 Long term (current) use of inhaled steroids: Secondary | ICD-10-CM | POA: Diagnosis not present

## 2017-05-14 DIAGNOSIS — M1990 Unspecified osteoarthritis, unspecified site: Secondary | ICD-10-CM | POA: Insufficient documentation

## 2017-05-14 DIAGNOSIS — K219 Gastro-esophageal reflux disease without esophagitis: Secondary | ICD-10-CM | POA: Insufficient documentation

## 2017-05-14 DIAGNOSIS — G4733 Obstructive sleep apnea (adult) (pediatric): Secondary | ICD-10-CM | POA: Diagnosis not present

## 2017-05-14 DIAGNOSIS — Z79899 Other long term (current) drug therapy: Secondary | ICD-10-CM | POA: Diagnosis not present

## 2017-05-14 DIAGNOSIS — J45909 Unspecified asthma, uncomplicated: Secondary | ICD-10-CM | POA: Diagnosis present

## 2017-05-14 DIAGNOSIS — Z888 Allergy status to other drugs, medicaments and biological substances status: Secondary | ICD-10-CM | POA: Insufficient documentation

## 2017-05-14 DIAGNOSIS — J45901 Unspecified asthma with (acute) exacerbation: Secondary | ICD-10-CM | POA: Insufficient documentation

## 2017-05-14 DIAGNOSIS — R059 Cough, unspecified: Secondary | ICD-10-CM

## 2017-05-14 MED ORDER — HYDROCOD POLST-CPM POLST ER 10-8 MG/5ML PO SUER: 5.0000 mL | Freq: Two times a day (BID) | ORAL | 0 refills | Status: DC | PRN

## 2017-05-14 MED ORDER — ACCU-CHEK AVIVA DEVI: 0 refills | Status: DC

## 2017-05-14 NOTE — Telephone Encounter (Signed)
Pt saw the PCP today an was prescribe chlorpheniramine-HYDROcodone (TUSSIONEX PENNKINETIC ER) 10-8 MG/5ML SUER  But is not cover by the insurance, can is possible to get something else that is cover by her insurance. Please follow up

## 2017-05-14 NOTE — Progress Notes (Signed)
[facility]  Hospitalization dates: 8/15 - 05/08/17  Subjective:  Patient ID: [name], female    DOB: 12/16/1970  Age: 46 y.o. MRN: [id number]  CC: Asthma   [name]  is a 47 year old female with a history of type 2 diabetes mellitus (A1c 7.1), hypertension, obstructive sleep apnea, asthma, depression, s/p tracheostomy secondary to intubation related tracheal injury for respiratory failure (currently on oxygen)  She was recently hospitalized for asthma exacerbation after she has presented with cough and shortness of breath and met criteria for sepsis on admission with tachycardia, tachypnea, lactic acidosis. Empiric antibiotics for hospital acquired pneumonia and Solumedrol were administered, CTA of the chest was negative for PE or pneumonia. Bronchoscopy by pulmonary revealed dried semi bloody secretions on posterior wall of trach and carina, no active bleeding.  Antibiotics and steroids were subsequently discontinued and she was discharged to follow up with [facility] ENT (upcoming appointment next week) and Pulmonary (upcoming appointment 05/20/17)  Today she complains of cough associated with chest pain which also causes pain at the site of her trach; denies shortness of breath. She also has low back pain for which she uses a muscle relaxant but has been unable to make it to the pharmacy to pick up her medications.  She received all her refills at her last visit and has been compliant with her antihypertensives and diabetic medications.  Past Medical History:  Diagnosis Date  . Arthritis   . Asthma   . Chronic back pain   . Chronic chest pain   . Depression   . DM (diabetes mellitus) ([place])    INSULIN DEPENDENT  . GERD (gastroesophageal reflux disease)   . Headache(784.0)   . Hypertension   . Hypokalemia     Past Surgical History:  Procedure Laterality Date  . APPENDECTOMY    . CESAREAN SECTION     x 3  .  CHOLECYSTECTOMY N/A 03/02/2014   Procedure: LAPAROSCOPIC CHOLECYSTECTOMY;  Surgeon: Joyice Faster. Cornett, MD;  Location: [place];  Service: General;  Laterality: N/A;  . HERNIA REPAIR    . PANENDOSCOPY N/A 03/04/2017   Procedure: PANENDOSCOPY WITH POSSIBLE FOREIGN BODY REMOVAL;  Surgeon: Jodi Marble, MD;  Location: [place] [place];  Service: ENT;  Laterality: N/A;  . ROTATOR CUFF REPAIR    . VESICOVAGINAL FISTULA CLOSURE W/ TAH  2009    Allergies  Allergen Reactions  . Reglan [Metoclopramide] Other (See Comments)    Panic attack    Outpatient Medications Prior to Visit  Medication Sig Dispense Refill  . albuterol (PROVENTIL HFA;VENTOLIN HFA) 108 (90 Base) MCG/ACT inhaler Inhale 2 puffs into the lungs every 6 (six) hours as needed for wheezing or shortness of breath. 1 Inhaler 2  . amLODipine (NORVASC) 10 MG tablet Take 1 tablet (10 mg total) by mouth daily. 30 tablet 5  . atorvastatin (LIPITOR) 20 MG tablet Take 1 tablet (20 mg total) by mouth daily. 30 tablet 5  . carvedilol (COREG) 6.25 MG tablet Take 1 tablet (6.25 mg total) by mouth 2 (two) times daily with a meal. 60 tablet 5  . chlorhexidine gluconate, MEDLINE KIT, (PERIDEX) 0.12 % solution 15 mLs by Mouth Rinse route 2 (two) times daily. 120 mL 0  . Dulaglutide 1.5 MG/0.5ML SOPN Inject 1.5 mg into the skin.    Marland Kitchen Fluticasone-Salmeterol (ADVAIR DISKUS) 250-50 MCG/DOSE AEPB Inhale 1 puff into the lungs 2 (two) times daily. 60 each 3  . gabapentin (NEURONTIN) 300 MG capsule Take 1 capsule (  300 mg total) by mouth 3 (three) times daily. 90 capsule 5  . guaiFENesin-dextromethorphan (ROBITUSSIN DM) 100-10 MG/5ML syrup Take 10 mLs by mouth every 4 (four) hours as needed for cough. 118 mL 1  . ibuprofen (MOTRIN IB) 200 MG tablet Take 2 tablets (400 mg total) by mouth every 6 (six) hours as needed. 30 tablet 0  . pantoprazole (PROTONIX) 40 MG tablet Take 40 mg by mouth.    . sertraline (ZOLOFT) 100 MG tablet Place 1 tablet (100 mg total) into feeding tube  daily. 30 tablet 5  . sodium chloride HYPERTONIC 3 % nebulizer solution Take 4 mLs by nebulization 2 (two) times daily. 750 mL 0  . tiZANidine (ZANAFLEX) 4 MG tablet Take 1 tablet (4 mg total) by mouth every 8 (eight) hours as needed for muscle spasms. 90 tablet 1  . topiramate (TOPAMAX) 100 MG tablet Take 1 tablet (100 mg total) by mouth 2 (two) times daily. 60 tablet 5  . albuterol (PROVENTIL) (2.5 MG/3ML) 0.083% nebulizer solution Take 3 mLs (2.5 mg total) by nebulization every 6 (six) hours as needed for wheezing or shortness of breath. Reported on 02/22/2016 (Patient not taking: Reported on 05/14/2017) 75 mL 3   No facility-administered medications prior to visit.     ROS Review of Systems Constitutional: Negative for activity change, appetite change and fatigue.  HENT: Negative for congestion, sinus pressure and sore throat.   Eyes: Negative for visual disturbance.  Respiratory: Positive for cough, chest tightness, shortness of breath and wheezing.   Cardiovascular: Negative for chest pain and palpitations.  Gastrointestinal: Negative for abdominal distention, abdominal pain and constipation.  Endocrine: Negative for polydipsia.  Genitourinary: Negative for dysuria and frequency.  Musculoskeletal: Negative for arthralgias and back pain.  Skin: Negative for rash.  Neurological: Negative for tremors, light-headedness and numbness.  Hematological: Does not bruise/bleed easily.  Psychiatric/Behavioral: Negative for agitation and behavioral problems.  Objective:  BP (!) 139/93   Pulse 97   Temp 98.6 F (37 C) (Oral)   Ht 5' 1"  (1.549 m)   Wt 218 lb 6.4 oz (99.1 kg)   SpO2 100%   BMI 41.27 kg/m   BP/Weight 05/14/2017 05/08/2017 4/82/7078  Systolic BP 675 449 -  Diastolic BP 93 63 -  Wt. (Lbs) 218.4 - 230.16  BMI 41.27 - 43.49      Physical Exam Constitutional: She is oriented to person, place, and time. She appears well-developed and well-nourished.  Neck:  Tracheostomy in  place - on 4 L oxygen  Cardiovascular: Normal rate, normal heart sounds and intact distal pulses.   No murmur heard. Pulmonary/Chest: Effort normal and breath sounds normal. She has no wheezes. She has no rales. She exhibits no tenderness.  Abdominal: Soft. Bowel sounds are normal. She exhibits no distension and no mass. There is no tenderness.  Musculoskeletal: Normal range of motion.  Neurological: She is alert and oriented to person, place, and time.  Skin: Skin is warm and dry.  Psychiatric: She has a normal mood and affect.    Lab Results  Component Value Date   HGBA1C 7.1 (H) 05/08/2017     Assessment & Plan:   1. Type 2 diabetes mellitus with diabetic polyneuropathy, without long-term current use of insulin (HCC) Controlled with A1c of 7.1 Continue current medications Keep appointment with endocrine  2. Tracheostomy status ([place]) Continue oxygen Upcoming appointment with ENT next week  3. Sleep apnea, obstructive Stable  4. Severe asthma with acute exacerbation, unspecified whether persistent Recent  exacerbation Continue Advair, Proventil, nebulizer treatment as needed Keep appointment with pulmonary on 8/29  5. Cough Uncontrolled Advised to obtain OTC Zyrtec as postnasal drip could be contributory - chlorpheniramine-HYDROcodone (TUSSIONEX PENNKINETIC ER) 10-8 MG/5ML SUER; Take 5 mLs by mouth every 12 (twelve) hours as needed for cough.  Dispense: 140 mL; Refill: 0  6. Essential hypertension Amlodipine was increased at the last visit Still with slight diastolic elevation Low-sodium diet   Meds ordered this encounter  Medications  . chlorpheniramine-HYDROcodone (TUSSIONEX PENNKINETIC ER) 10-8 MG/5ML SUER    Sig: Take 5 mLs by mouth every 12 (twelve) hours as needed for cough.    Dispense:  140 mL    Refill:  0    Follow-up: Return in about 3 weeks (around 06/04/2017) for TCC - follow-up on chronic medical conditions.   Arnoldo Morale MD

## 2017-05-14 NOTE — Telephone Encounter (Signed)
This is a controlled substance so I cannot change it, I will forward to Dr. Jarold Song. I will say most medications like this are not covered by insurance.

## 2017-05-15 ENCOUNTER — Other Ambulatory Visit: Payer: Self-pay | Admitting: Pharmacist

## 2017-05-15 NOTE — Telephone Encounter (Signed)
Attempted to contact the patient to inform her that cough medications like this are not covered by insurance. Call placed to # [phone] (M) and a HIPAA compliant voicemail message was left requesting a call back to # [phone].

## 2017-05-18 DIAGNOSIS — E1165 Type 2 diabetes mellitus with hyperglycemia: Secondary | ICD-10-CM | POA: Diagnosis not present

## 2017-05-18 DIAGNOSIS — J386 Stenosis of larynx: Secondary | ICD-10-CM | POA: Diagnosis not present

## 2017-05-18 DIAGNOSIS — G4733 Obstructive sleep apnea (adult) (pediatric): Secondary | ICD-10-CM | POA: Diagnosis not present

## 2017-05-18 DIAGNOSIS — M1990 Unspecified osteoarthritis, unspecified site: Secondary | ICD-10-CM | POA: Diagnosis not present

## 2017-05-18 DIAGNOSIS — E1140 Type 2 diabetes mellitus with diabetic neuropathy, unspecified: Secondary | ICD-10-CM | POA: Diagnosis not present

## 2017-05-18 DIAGNOSIS — F419 Anxiety disorder, unspecified: Secondary | ICD-10-CM | POA: Diagnosis not present

## 2017-05-18 DIAGNOSIS — Z87891 Personal history of nicotine dependence: Secondary | ICD-10-CM | POA: Diagnosis not present

## 2017-05-18 DIAGNOSIS — F329 Major depressive disorder, single episode, unspecified: Secondary | ICD-10-CM | POA: Diagnosis not present

## 2017-05-18 DIAGNOSIS — Z7951 Long term (current) use of inhaled steroids: Secondary | ICD-10-CM | POA: Diagnosis not present

## 2017-05-18 DIAGNOSIS — Z7984 Long term (current) use of oral hypoglycemic drugs: Secondary | ICD-10-CM | POA: Diagnosis not present

## 2017-05-18 DIAGNOSIS — I110 Hypertensive heart disease with heart failure: Secondary | ICD-10-CM | POA: Diagnosis not present

## 2017-05-18 DIAGNOSIS — G8929 Other chronic pain: Secondary | ICD-10-CM | POA: Diagnosis not present

## 2017-05-18 DIAGNOSIS — J45909 Unspecified asthma, uncomplicated: Secondary | ICD-10-CM | POA: Diagnosis not present

## 2017-05-18 DIAGNOSIS — J9610 Chronic respiratory failure, unspecified whether with hypoxia or hypercapnia: Secondary | ICD-10-CM | POA: Diagnosis not present

## 2017-05-18 DIAGNOSIS — R918 Other nonspecific abnormal finding of lung field: Secondary | ICD-10-CM | POA: Diagnosis not present

## 2017-05-18 DIAGNOSIS — E785 Hyperlipidemia, unspecified: Secondary | ICD-10-CM | POA: Diagnosis not present

## 2017-05-18 DIAGNOSIS — Z79899 Other long term (current) drug therapy: Secondary | ICD-10-CM | POA: Diagnosis not present

## 2017-05-18 DIAGNOSIS — K219 Gastro-esophageal reflux disease without esophagitis: Secondary | ICD-10-CM | POA: Diagnosis not present

## 2017-05-18 DIAGNOSIS — Z430 Encounter for attention to tracheostomy: Secondary | ICD-10-CM | POA: Diagnosis not present

## 2017-05-18 DIAGNOSIS — I5032 Chronic diastolic (congestive) heart failure: Secondary | ICD-10-CM | POA: Diagnosis not present

## 2017-05-18 DIAGNOSIS — Z6841 Body Mass Index (BMI) 40.0 and over, adult: Secondary | ICD-10-CM | POA: Diagnosis not present

## 2017-05-18 DIAGNOSIS — M549 Dorsalgia, unspecified: Secondary | ICD-10-CM | POA: Diagnosis not present

## 2017-05-18 DIAGNOSIS — Z888 Allergy status to other drugs, medicaments and biological substances status: Secondary | ICD-10-CM | POA: Diagnosis not present

## 2017-05-19 DIAGNOSIS — J9610 Chronic respiratory failure, unspecified whether with hypoxia or hypercapnia: Secondary | ICD-10-CM | POA: Diagnosis not present

## 2017-05-19 DIAGNOSIS — T380X5A Adverse effect of glucocorticoids and synthetic analogues, initial encounter: Secondary | ICD-10-CM | POA: Diagnosis not present

## 2017-05-19 DIAGNOSIS — Z430 Encounter for attention to tracheostomy: Secondary | ICD-10-CM | POA: Diagnosis not present

## 2017-05-19 DIAGNOSIS — J45909 Unspecified asthma, uncomplicated: Secondary | ICD-10-CM | POA: Diagnosis not present

## 2017-05-19 DIAGNOSIS — E1165 Type 2 diabetes mellitus with hyperglycemia: Secondary | ICD-10-CM | POA: Diagnosis not present

## 2017-05-19 DIAGNOSIS — J386 Stenosis of larynx: Secondary | ICD-10-CM | POA: Diagnosis not present

## 2017-05-19 DIAGNOSIS — I110 Hypertensive heart disease with heart failure: Secondary | ICD-10-CM | POA: Diagnosis not present

## 2017-05-19 DIAGNOSIS — I5032 Chronic diastolic (congestive) heart failure: Secondary | ICD-10-CM | POA: Diagnosis not present

## 2017-05-20 ENCOUNTER — Institutional Professional Consult (permissible substitution): Payer: Medicare Other | Admitting: Internal Medicine

## 2017-05-20 DIAGNOSIS — Z430 Encounter for attention to tracheostomy: Secondary | ICD-10-CM | POA: Diagnosis not present

## 2017-05-20 DIAGNOSIS — I509 Heart failure, unspecified: Secondary | ICD-10-CM | POA: Insufficient documentation

## 2017-05-20 DIAGNOSIS — I110 Hypertensive heart disease with heart failure: Secondary | ICD-10-CM | POA: Diagnosis not present

## 2017-05-20 DIAGNOSIS — T380X5A Adverse effect of glucocorticoids and synthetic analogues, initial encounter: Secondary | ICD-10-CM | POA: Diagnosis not present

## 2017-05-20 DIAGNOSIS — J45909 Unspecified asthma, uncomplicated: Secondary | ICD-10-CM | POA: Diagnosis not present

## 2017-05-20 DIAGNOSIS — J386 Stenosis of larynx: Secondary | ICD-10-CM | POA: Diagnosis not present

## 2017-05-20 DIAGNOSIS — J9610 Chronic respiratory failure, unspecified whether with hypoxia or hypercapnia: Secondary | ICD-10-CM | POA: Diagnosis not present

## 2017-05-20 DIAGNOSIS — I5032 Chronic diastolic (congestive) heart failure: Secondary | ICD-10-CM | POA: Diagnosis not present

## 2017-05-20 DIAGNOSIS — E1165 Type 2 diabetes mellitus with hyperglycemia: Secondary | ICD-10-CM | POA: Diagnosis not present

## 2017-05-23 DIAGNOSIS — J989 Respiratory disorder, unspecified: Secondary | ICD-10-CM

## 2017-05-23 HISTORY — DX: Respiratory disorder, unspecified: J98.9

## 2017-05-26 ENCOUNTER — Other Ambulatory Visit: Payer: Self-pay | Admitting: Pharmacist

## 2017-05-26 DIAGNOSIS — E7800 Pure hypercholesterolemia, unspecified: Secondary | ICD-10-CM

## 2017-05-26 DIAGNOSIS — I10 Essential (primary) hypertension: Secondary | ICD-10-CM

## 2017-05-26 DIAGNOSIS — J4530 Mild persistent asthma, uncomplicated: Secondary | ICD-10-CM

## 2017-05-26 DIAGNOSIS — G43119 Migraine with aura, intractable, without status migrainosus: Secondary | ICD-10-CM

## 2017-05-26 MED ORDER — FLUTICASONE-SALMETEROL 250-50 MCG/DOSE IN AEPB
1.0000 | INHALATION_SPRAY | Freq: Two times a day (BID) | RESPIRATORY_TRACT | 0 refills | Status: DC
Start: 2017-05-26 — End: 2017-10-15

## 2017-05-26 MED ORDER — ATORVASTATIN CALCIUM 20 MG PO TABS: 20.0000 mg | ORAL_TABLET | Freq: Every day | ORAL | 0 refills | Status: DC

## 2017-05-26 MED ORDER — CARVEDILOL 6.25 MG PO TABS: 6.2500 mg | ORAL_TABLET | Freq: Two times a day (BID) | ORAL | 0 refills | Status: DC

## 2017-05-26 MED ORDER — TOPIRAMATE 100 MG PO TABS: 100.0000 mg | ORAL_TABLET | Freq: Two times a day (BID) | ORAL | 0 refills | Status: DC

## 2017-05-26 MED ORDER — AMLODIPINE BESYLATE 10 MG PO TABS: 10.0000 mg | ORAL_TABLET | Freq: Every day | ORAL | 0 refills | Status: DC

## 2017-05-27 ENCOUNTER — Ambulatory Visit (INDEPENDENT_AMBULATORY_CARE_PROVIDER_SITE_OTHER): Payer: Medicare Other | Admitting: Endocrinology

## 2017-05-27 ENCOUNTER — Encounter: Payer: Self-pay | Admitting: Endocrinology

## 2017-05-27 VITALS — BP 130/84 | HR 96 | Wt 227.6 lb

## 2017-05-27 DIAGNOSIS — E1142 Type 2 diabetes mellitus with diabetic polyneuropathy: Secondary | ICD-10-CM

## 2017-05-27 MED ORDER — METFORMIN HCL ER 500 MG PO TB24: 500.0000 mg | ORAL_TABLET | Freq: Every day | ORAL | 3 refills | Status: DC

## 2017-05-27 MED ORDER — GLUCOSE BLOOD VI STRP: 1.0000 | ORAL_STRIP | Freq: Every day | 12 refills | Status: DC

## 2017-05-27 MED ORDER — ACCU-CHEK AVIVA PLUS W/DEVICE KIT: 1.0000 | PACK | Freq: Once | 0 refills | Status: DC

## 2017-05-27 MED ORDER — DULAGLUTIDE 1.5 MG/0.5ML ~~LOC~~ SOAJ: 1.5000 mg | SUBCUTANEOUS | 11 refills | Status: DC

## 2017-05-27 NOTE — Patient Instructions (Addendum)
I have sent a prescription to your pharmacy, to add extended-release metformin Please call if this bothers your stomach, and we would be happy to change it. Please come back for a follow-up appointment in 2 months check your blood sugar once a day.  vary the time of day when you check, between before the 3 meals, and at bedtime.  also check if you have symptoms of your blood sugar being too high or too low.  please keep a record of the readings and bring it to your next appointment here (or you can bring the meter itself).  You can write it on any piece of paper.  please call us sooner if your blood sugar goes below 70, or if you have a lot of readings over 200.

## 2017-05-27 NOTE — Progress Notes (Signed)
Subjective:    Patient ID: [name], female    DOB: 1971-01-29, 46 y.o.   MRN: [id number]  HPI Pt returns for f/u of diabetes mellitus: DM type: 2 Dx'ed: 0814 Complications: painful polyneuropathy.  Therapy: trulicity.  GDM: never DKA: never.  Severe hypoglycemia: never.  Pancreatitis: never.   Other: she declines weight loss surgery; edema precludes pioglitizone rx; she took insulin from 2012-2017.   Interval history: pt says cbg's are well-controlled.  Since last ov here, she has been hospitalized with sepsis.  no cbg record, but states cbg's vary from 70-200's.  She hesitates to resume metformin (diarrhea).   Past Medical History:  Diagnosis Date  . Arthritis   . Asthma   . Chronic back pain   . Chronic chest pain   . Depression   . DM (diabetes mellitus) ([place])    INSULIN DEPENDENT  . GERD (gastroesophageal reflux disease)   . Headache(784.0)   . Hypertension   . Hypokalemia     Past Surgical History:  Procedure Laterality Date  . APPENDECTOMY    . CESAREAN SECTION     x 3  . CHOLECYSTECTOMY N/A 03/02/2014   Procedure: LAPAROSCOPIC CHOLECYSTECTOMY;  Surgeon: Joyice Faster. Cornett, MD;  Location: [facility];  Service: General;  Laterality: N/A;  . HERNIA REPAIR    . PANENDOSCOPY N/A 03/04/2017   Procedure: PANENDOSCOPY WITH POSSIBLE FOREIGN BODY REMOVAL;  Surgeon: Jodi Marble, MD;  Location: [place];  Service: ENT;  Laterality: N/A;  . ROTATOR CUFF REPAIR    . VESICOVAGINAL FISTULA CLOSURE W/ TAH  2009    Social History   Social History  . Marital status: Married    Spouse name: N/A  . Number of children: N/A  . Years of education: N/A   Occupational History  . Not on file.   Social History Main Topics  . Smoking status: Former Smoker    Packs/day: 0.25    Years: 22.00    Types: Cigarettes  . Smokeless tobacco: Never Used  . Alcohol use No     Comment: socially  . Drug use: No  . Sexual activity: Yes    Partners: Male   Other Topics  Concern  . Not on file   Social History Narrative  . No narrative on file    Current Outpatient Prescriptions on File Prior to Visit  Medication Sig Dispense Refill  . albuterol (PROVENTIL HFA;VENTOLIN HFA) 108 (90 Base) MCG/ACT inhaler Inhale 2 puffs into the lungs every 6 (six) hours as needed for wheezing or shortness of breath. 1 Inhaler 2  . albuterol (PROVENTIL) (2.5 MG/3ML) 0.083% nebulizer solution Take 3 mLs (2.5 mg total) by nebulization every 6 (six) hours as needed for wheezing or shortness of breath. Reported on 02/22/2016 (Patient not taking: Reported on 05/14/2017) 75 mL 3  . amLODipine (NORVASC) 10 MG tablet Take 1 tablet (10 mg total) by mouth daily. 90 tablet 0  . atorvastatin (LIPITOR) 20 MG tablet Take 1 tablet (20 mg total) by mouth daily. 90 tablet 0  . carvedilol (COREG) 6.25 MG tablet Take 1 tablet (6.25 mg total) by mouth 2 (two) times daily with a meal. 180 tablet 0  . chlorhexidine gluconate, MEDLINE KIT, (PERIDEX) 0.12 % solution 15 mLs by Mouth Rinse route 2 (two) times daily. 120 mL 0  . chlorpheniramine-HYDROcodone (TUSSIONEX PENNKINETIC ER) 10-8 MG/5ML SUER Take 5 mLs by mouth every 12 (twelve) hours as needed for cough. 140 mL 0  . Fluticasone-Salmeterol (ADVAIR DISKUS) 250-50  MCG/DOSE AEPB Inhale 1 puff into the lungs 2 (two) times daily. 180 each 0  . gabapentin (NEURONTIN) 300 MG capsule Take 1 capsule (300 mg total) by mouth 3 (three) times daily. 90 capsule 5  . guaiFENesin-dextromethorphan (ROBITUSSIN DM) 100-10 MG/5ML syrup Take 10 mLs by mouth every 4 (four) hours as needed for cough. 118 mL 1  . ibuprofen (MOTRIN IB) 200 MG tablet Take 2 tablets (400 mg total) by mouth every 6 (six) hours as needed. 30 tablet 0  . pantoprazole (PROTONIX) 40 MG tablet Take 40 mg by mouth.    . sertraline (ZOLOFT) 100 MG tablet Place 1 tablet (100 mg total) into feeding tube daily. 30 tablet 5  . sodium chloride HYPERTONIC 3 % nebulizer solution Take 4 mLs by nebulization 2  (two) times daily. 750 mL 0  . tiZANidine (ZANAFLEX) 4 MG tablet Take 1 tablet (4 mg total) by mouth every 8 (eight) hours as needed for muscle spasms. 90 tablet 1  . topiramate (TOPAMAX) 100 MG tablet Take 1 tablet (100 mg total) by mouth 2 (two) times daily. 180 tablet 0  . [DISCONTINUED] promethazine (PHENERGAN) 25 MG tablet Take 1 tablet (25 mg total) by mouth every 6 (six) hours as needed for nausea. (Patient not taking: Reported on 12/13/2014) 20 tablet 0   No current facility-administered medications on file prior to visit.     Allergies  Allergen Reactions  . Reglan [Metoclopramide] Other (See Comments)    Panic attack    Family History  Problem Relation Age of Onset  . Heart attack Mother   . Stroke Mother   . Diabetes Mother   . Hypertension Mother   . Arthritis Mother   . Stroke Father   . Hypertension Sister   . Diabetes Sister   . Seizures Brother   . Diabetes Brother     BP 130/84   Pulse 96   Wt 227 lb 9.6 oz (103.2 kg)   SpO2 97%   BMI 43.00 kg/m    Review of Systems She has lost a few lbs.      Objective:   Physical Exam VITAL SIGNS:  See vs page GENERAL: no distress.  Has tracheostomy.  Pulses: foot pulses are intact bilaterally.   MSK: no deformity of the feet or ankles.  CV: trace bilat edema of the legs.  Skin:  no ulcer on the feet or ankles.  normal color and temp on the feet and ankles Neuro: sensation is intact to touch on the feet and ankles.    Lab Results  Component Value Date   HGBA1C 7.1 (H) 05/08/2017   Lab Results  Component Value Date   CREATININE 1.00 05/08/2017   BUN 10 05/08/2017   NA 137 05/08/2017   K 3.5 05/08/2017   CL 107 05/08/2017   CO2 22 05/08/2017      Assessment & Plan:  Type 2 DM: Needs increased rx, if it can be done with a regimen that avoids or minimizes hypoglycemia. Sepsis: new to me: this has caused some weight loss, and therefore improved glycemic control.   Diarrhea: poss due to metformin  immed-release  Patient Instructions  I have sent a prescription to your pharmacy, to add extended-release metformin Please call if this bothers your stomach, and we would be happy to change it. Please come back for a follow-up appointment in 2 months check your blood sugar once a day.  vary the time of day when you check, between before the 3  meals, and at bedtime.  also check if you have symptoms of your blood sugar being too high or too low.  please keep a record of the readings and bring it to your next appointment here (or you can bring the meter itself).  You can write it on any piece of paper.  please call us sooner if your blood sugar goes below 70, or if you have a lot of readings over 200.

## 2017-05-28 ENCOUNTER — Telehealth: Payer: Self-pay | Admitting: Endocrinology

## 2017-05-28 ENCOUNTER — Other Ambulatory Visit: Payer: Self-pay

## 2017-05-28 NOTE — Telephone Encounter (Signed)
Routing to you °

## 2017-05-28 NOTE — Telephone Encounter (Signed)
MEDICATION: 1.Trulicity 2. Accu Check Meter went out  PHARMACY: [facility] [place] [address] [place] ([place]) [place] [address] Phone: [phone] Fax: [phone]   IS THIS A 90 DAY SUPPLY : yes  IS PATIENT OUT OF MEDICATION: no  IF NOT; HOW MUCH IS LEFT:  2 doses  LAST APPOINTMENT DATE: 09/05  NEXT APPOINTMENT DATE: 11/05  OTHER COMMENTS: Patient also needs a new script for Metformin. Accucheck meter went out, new meter needs to be Medicaid and Medicare eligible.

## 2017-05-29 ENCOUNTER — Other Ambulatory Visit: Payer: Self-pay

## 2017-05-29 MED ORDER — METFORMIN HCL ER 500 MG PO TB24: 500.0000 mg | ORAL_TABLET | Freq: Every day | ORAL | 3 refills | Status: DC

## 2017-05-29 MED ORDER — ACCU-CHEK AVIVA PLUS W/DEVICE KIT: PACK | 0 refills | Status: DC

## 2017-05-29 NOTE — Telephone Encounter (Signed)
Called patient & notified that prescriptions were sent into [facility].

## 2017-06-02 ENCOUNTER — Inpatient Hospital Stay (HOSPITAL_COMMUNITY)
Admission: EM | Admit: 2017-06-02 | Discharge: 2017-06-09 | DRG: 202 | Disposition: A | Discharge status: Home / Self Care | Payer: Medicare Other | Attending: Internal Medicine | Admitting: Internal Medicine

## 2017-06-02 ENCOUNTER — Other Ambulatory Visit: Payer: Self-pay

## 2017-06-02 ENCOUNTER — Encounter (HOSPITAL_COMMUNITY): Payer: Self-pay

## 2017-06-02 ENCOUNTER — Emergency Department (HOSPITAL_COMMUNITY): Payer: Medicare Other

## 2017-06-02 ENCOUNTER — Inpatient Hospital Stay (HOSPITAL_COMMUNITY): Payer: Medicare Other

## 2017-06-02 DIAGNOSIS — Z8249 Family history of ischemic heart disease and other diseases of the circulatory system: Secondary | ICD-10-CM

## 2017-06-02 DIAGNOSIS — G894 Chronic pain syndrome: Secondary | ICD-10-CM | POA: Diagnosis present

## 2017-06-02 DIAGNOSIS — R0902 Hypoxemia: Secondary | ICD-10-CM | POA: Diagnosis not present

## 2017-06-02 DIAGNOSIS — F411 Generalized anxiety disorder: Secondary | ICD-10-CM | POA: Diagnosis present

## 2017-06-02 DIAGNOSIS — J9621 Acute and chronic respiratory failure with hypoxia: Secondary | ICD-10-CM | POA: Diagnosis present

## 2017-06-02 DIAGNOSIS — R0602 Shortness of breath: Secondary | ICD-10-CM | POA: Diagnosis not present

## 2017-06-02 DIAGNOSIS — D649 Anemia, unspecified: Secondary | ICD-10-CM | POA: Diagnosis present

## 2017-06-02 DIAGNOSIS — M1990 Unspecified osteoarthritis, unspecified site: Secondary | ICD-10-CM | POA: Diagnosis present

## 2017-06-02 DIAGNOSIS — Z87891 Personal history of nicotine dependence: Secondary | ICD-10-CM

## 2017-06-02 DIAGNOSIS — G4733 Obstructive sleep apnea (adult) (pediatric): Secondary | ICD-10-CM | POA: Diagnosis present

## 2017-06-02 DIAGNOSIS — Z833 Family history of diabetes mellitus: Secondary | ICD-10-CM | POA: Diagnosis not present

## 2017-06-02 DIAGNOSIS — I10 Essential (primary) hypertension: Secondary | ICD-10-CM | POA: Diagnosis not present

## 2017-06-02 DIAGNOSIS — M549 Dorsalgia, unspecified: Secondary | ICD-10-CM | POA: Diagnosis present

## 2017-06-02 DIAGNOSIS — G8929 Other chronic pain: Secondary | ICD-10-CM | POA: Diagnosis present

## 2017-06-02 DIAGNOSIS — R740 Nonspecific elevation of levels of transaminase and lactic acid dehydrogenase [LDH]: Secondary | ICD-10-CM | POA: Diagnosis present

## 2017-06-02 DIAGNOSIS — Z8261 Family history of arthritis: Secondary | ICD-10-CM

## 2017-06-02 DIAGNOSIS — N179 Acute kidney failure, unspecified: Secondary | ICD-10-CM | POA: Diagnosis not present

## 2017-06-02 DIAGNOSIS — E872 Acidosis, unspecified: Secondary | ICD-10-CM

## 2017-06-02 DIAGNOSIS — J398 Other specified diseases of upper respiratory tract: Secondary | ICD-10-CM | POA: Diagnosis not present

## 2017-06-02 DIAGNOSIS — Z6841 Body Mass Index (BMI) 40.0 and over, adult: Secondary | ICD-10-CM

## 2017-06-02 DIAGNOSIS — J209 Acute bronchitis, unspecified: Secondary | ICD-10-CM | POA: Diagnosis not present

## 2017-06-02 DIAGNOSIS — R6510 Systemic inflammatory response syndrome (SIRS) of non-infectious origin without acute organ dysfunction: Secondary | ICD-10-CM

## 2017-06-02 DIAGNOSIS — I129 Hypertensive chronic kidney disease with stage 1 through stage 4 chronic kidney disease, or unspecified chronic kidney disease: Secondary | ICD-10-CM | POA: Diagnosis present

## 2017-06-02 DIAGNOSIS — R7989 Other specified abnormal findings of blood chemistry: Secondary | ICD-10-CM | POA: Diagnosis not present

## 2017-06-02 DIAGNOSIS — R0682 Tachypnea, not elsewhere classified: Secondary | ICD-10-CM | POA: Diagnosis present

## 2017-06-02 DIAGNOSIS — K219 Gastro-esophageal reflux disease without esophagitis: Secondary | ICD-10-CM | POA: Diagnosis present

## 2017-06-02 DIAGNOSIS — Z888 Allergy status to other drugs, medicaments and biological substances status: Secondary | ICD-10-CM

## 2017-06-02 DIAGNOSIS — Z9889 Other specified postprocedural states: Secondary | ICD-10-CM

## 2017-06-02 DIAGNOSIS — E1142 Type 2 diabetes mellitus with diabetic polyneuropathy: Secondary | ICD-10-CM | POA: Diagnosis present

## 2017-06-02 DIAGNOSIS — Z823 Family history of stroke: Secondary | ICD-10-CM | POA: Diagnosis not present

## 2017-06-02 DIAGNOSIS — J45901 Unspecified asthma with (acute) exacerbation: Secondary | ICD-10-CM | POA: Diagnosis not present

## 2017-06-02 DIAGNOSIS — F329 Major depressive disorder, single episode, unspecified: Secondary | ICD-10-CM | POA: Diagnosis present

## 2017-06-02 DIAGNOSIS — Z930 Tracheostomy status: Secondary | ICD-10-CM | POA: Diagnosis not present

## 2017-06-02 DIAGNOSIS — I5030 Unspecified diastolic (congestive) heart failure: Secondary | ICD-10-CM | POA: Diagnosis not present

## 2017-06-02 DIAGNOSIS — T380X5A Adverse effect of glucocorticoids and synthetic analogues, initial encounter: Secondary | ICD-10-CM | POA: Diagnosis present

## 2017-06-02 DIAGNOSIS — E875 Hyperkalemia: Secondary | ICD-10-CM | POA: Diagnosis present

## 2017-06-02 DIAGNOSIS — E876 Hypokalemia: Secondary | ICD-10-CM | POA: Diagnosis present

## 2017-06-02 DIAGNOSIS — D72820 Lymphocytosis (symptomatic): Secondary | ICD-10-CM | POA: Diagnosis not present

## 2017-06-02 DIAGNOSIS — E1165 Type 2 diabetes mellitus with hyperglycemia: Secondary | ICD-10-CM | POA: Diagnosis present

## 2017-06-02 DIAGNOSIS — Z794 Long term (current) use of insulin: Secondary | ICD-10-CM | POA: Diagnosis not present

## 2017-06-02 DIAGNOSIS — R000 Tachycardia, unspecified: Secondary | ICD-10-CM | POA: Diagnosis present

## 2017-06-02 DIAGNOSIS — J310 Chronic rhinitis: Secondary | ICD-10-CM | POA: Diagnosis present

## 2017-06-02 DIAGNOSIS — R062 Wheezing: Secondary | ICD-10-CM | POA: Diagnosis not present

## 2017-06-02 DIAGNOSIS — N182 Chronic kidney disease, stage 2 (mild): Secondary | ICD-10-CM | POA: Diagnosis present

## 2017-06-02 DIAGNOSIS — Z79899 Other long term (current) drug therapy: Secondary | ICD-10-CM | POA: Diagnosis not present

## 2017-06-02 DIAGNOSIS — Z9049 Acquired absence of other specified parts of digestive tract: Secondary | ICD-10-CM

## 2017-06-02 DIAGNOSIS — R079 Chest pain, unspecified: Secondary | ICD-10-CM | POA: Diagnosis not present

## 2017-06-02 DIAGNOSIS — E1122 Type 2 diabetes mellitus with diabetic chronic kidney disease: Secondary | ICD-10-CM | POA: Diagnosis present

## 2017-06-02 DIAGNOSIS — R0603 Acute respiratory distress: Secondary | ICD-10-CM

## 2017-06-02 DIAGNOSIS — D72828 Other elevated white blood cell count: Secondary | ICD-10-CM | POA: Diagnosis present

## 2017-06-02 DIAGNOSIS — J386 Stenosis of larynx: Secondary | ICD-10-CM | POA: Diagnosis present

## 2017-06-02 HISTORY — DX: Tracheostomy status: Z93.0

## 2017-06-02 HISTORY — DX: Respiratory disorder, unspecified: J98.9

## 2017-06-02 LAB — CBC WITH DIFFERENTIAL/PLATELET
BASOS ABS: 0 10*3/uL (ref 0.0–0.1)
BASOS PCT: 0 %
EOS ABS: 0 10*3/uL (ref 0.0–0.7)
Eosinophils Relative: 0 %
HEMATOCRIT: 33.2 % — AB (ref 36.0–46.0)
HEMOGLOBIN: 11 g/dL — AB (ref 12.0–15.0)
LYMPHS PCT: 51 %
Lymphs Abs: 6.2 10*3/uL — ABNORMAL HIGH (ref 0.7–4.0)
MCH: 27.6 pg (ref 26.0–34.0)
MCHC: 33.1 g/dL (ref 30.0–36.0)
MCV: 83.4 fL (ref 78.0–100.0)
MONOS PCT: 6 %
Monocytes Absolute: 0.7 10*3/uL (ref 0.1–1.0)
NEUTROS ABS: 5.2 10*3/uL (ref 1.7–7.7)
NEUTROS PCT: 43 %
Platelets: 424 10*3/uL — ABNORMAL HIGH (ref 150–400)
RBC: 3.98 MIL/uL (ref 3.87–5.11)
RDW: 14.8 % (ref 11.5–15.5)
WBC: 12.1 10*3/uL — ABNORMAL HIGH (ref 4.0–10.5)

## 2017-06-02 LAB — I-STAT CG4 LACTIC ACID, ED
LACTIC ACID, VENOUS: 6.24 mmol/L — AB (ref 0.5–1.9)
LACTIC ACID, VENOUS: 10.22 mmol/L — AB (ref 0.5–1.9)

## 2017-06-02 LAB — COMPREHENSIVE METABOLIC PANEL
ALBUMIN: 3.7 g/dL (ref 3.5–5.0)
ALT: 16 U/L (ref 14–54)
ANION GAP: 15 (ref 5–15)
AST: 21 U/L (ref 15–41)
Alkaline Phosphatase: 100 U/L (ref 38–126)
BILIRUBIN TOTAL: 0.4 mg/dL (ref 0.3–1.2)
BUN: 11 mg/dL (ref 6–20)
CHLORIDE: 105 mmol/L (ref 101–111)
CO2: 18 mmol/L — ABNORMAL LOW (ref 22–32)
Calcium: 9.4 mg/dL (ref 8.9–10.3)
Creatinine, Ser: 1.14 mg/dL — ABNORMAL HIGH (ref 0.44–1.00)
GFR calc Af Amer: >60 mL/min (ref >60)
GFR, EST NON AFRICAN AMERICAN: 56 mL/min — AB (ref >60)
Glucose, Bld: 220 mg/dL — ABNORMAL HIGH (ref 65–99)
POTASSIUM: 3.1 mmol/L — AB (ref 3.5–5.1)
Sodium: 138 mmol/L (ref 135–145)
Total Protein: 7.7 g/dL (ref 6.5–8.1)

## 2017-06-02 LAB — BRAIN NATRIURETIC PEPTIDE: B NATRIURETIC PEPTIDE 5: 32.9 pg/mL (ref 0.0–100.0)

## 2017-06-02 LAB — HEMOGLOBIN A1C
HEMOGLOBIN A1C: 8.3 % — AB (ref 4.8–5.6)
Mean Plasma Glucose: 191.51 mg/dL

## 2017-06-02 LAB — GLUCOSE, CAPILLARY: Glucose-Capillary: 386 mg/dL — ABNORMAL HIGH (ref 65–99)

## 2017-06-02 LAB — PROCALCITONIN: Procalcitonin: <0.1 ng/mL

## 2017-06-02 MED ORDER — INSULIN ASPART 100 UNIT/ML ~~LOC~~ SOLN
0.0000 [IU] | Freq: Three times a day (TID) | SUBCUTANEOUS | Status: DC
  Administered 2017-06-03: 8 [IU] via SUBCUTANEOUS
  Administered 2017-06-03: 15 [IU] via SUBCUTANEOUS

## 2017-06-02 MED ORDER — MAGNESIUM SULFATE 2 GM/50ML IV SOLN
2.0000 g | Freq: Once | INTRAVENOUS | Status: AC
  Administered 2017-06-02: 2 g via INTRAVENOUS
  Filled 2017-06-02: qty 50

## 2017-06-02 MED ORDER — SERTRALINE HCL 100 MG PO TABS
100.0000 mg | ORAL_TABLET | Freq: Every day | ORAL | Status: DC
  Administered 2017-06-03 – 2017-06-09 (×7): 100 mg
  Filled 2017-06-02 (×7): qty 1

## 2017-06-02 MED ORDER — ONDANSETRON HCL 4 MG PO TABS: 4.0000 mg | ORAL_TABLET | Freq: Four times a day (QID) | ORAL | Status: DC | PRN

## 2017-06-02 MED ORDER — DEXTROSE 5 % IV SOLN
1.0000 g | Freq: Three times a day (TID) | INTRAVENOUS | Status: DC
  Filled 2017-06-02: qty 1

## 2017-06-02 MED ORDER — POTASSIUM CHLORIDE CRYS ER 20 MEQ PO TBCR
40.0000 meq | EXTENDED_RELEASE_TABLET | Freq: Once | ORAL | Status: AC
  Administered 2017-06-02: 40 meq via ORAL
  Filled 2017-06-02: qty 2

## 2017-06-02 MED ORDER — PANTOPRAZOLE SODIUM 40 MG IV SOLR: 40.0000 mg | Freq: Two times a day (BID) | INTRAVENOUS | Status: DC

## 2017-06-02 MED ORDER — SODIUM CHLORIDE 0.9 % IV BOLUS (SEPSIS): 1000.0000 mL | Freq: Once | INTRAVENOUS | Status: DC

## 2017-06-02 MED ORDER — SODIUM CHLORIDE 0.9 % IV BOLUS (SEPSIS)
1000.0000 mL | Freq: Once | INTRAVENOUS | Status: AC
  Administered 2017-06-02: 1000 mL via INTRAVENOUS

## 2017-06-02 MED ORDER — ACETAMINOPHEN 650 MG RE SUPP: 650.0000 mg | Freq: Four times a day (QID) | RECTAL | Status: DC | PRN

## 2017-06-02 MED ORDER — IPRATROPIUM BROMIDE 0.06 % NA SOLN
2.0000 | Freq: Three times a day (TID) | NASAL | Status: DC
  Administered 2017-06-02 – 2017-06-09 (×17): 2 via NASAL
  Filled 2017-06-02: qty 15

## 2017-06-02 MED ORDER — SODIUM CHLORIDE 3 % IN NEBU
4.0000 mL | INHALATION_SOLUTION | Freq: Two times a day (BID) | RESPIRATORY_TRACT | Status: DC
  Administered 2017-06-02 – 2017-06-03 (×2): 4 mL via RESPIRATORY_TRACT
  Filled 2017-06-02 (×4): qty 4

## 2017-06-02 MED ORDER — FLUTICASONE PROPIONATE 50 MCG/ACT NA SUSP
2.0000 | Freq: Two times a day (BID) | NASAL | Status: DC
  Administered 2017-06-02 – 2017-06-09 (×14): 2 via NASAL
  Filled 2017-06-02: qty 16

## 2017-06-02 MED ORDER — ALBUTEROL SULFATE (2.5 MG/3ML) 0.083% IN NEBU
2.5000 mg | INHALATION_SOLUTION | RESPIRATORY_TRACT | Status: DC
  Administered 2017-06-02 – 2017-06-03 (×3): 2.5 mg via RESPIRATORY_TRACT
  Filled 2017-06-02 (×2): qty 3

## 2017-06-02 MED ORDER — IOPAMIDOL (ISOVUE-370) INJECTION 76%
INTRAVENOUS | Status: AC
  Administered 2017-06-02: 100 mL
  Filled 2017-06-02: qty 100

## 2017-06-02 MED ORDER — DEXTROSE 5 % IV SOLN
2.0000 g | Freq: Once | INTRAVENOUS | Status: AC
  Administered 2017-06-02: 2 g via INTRAVENOUS
  Filled 2017-06-02: qty 2

## 2017-06-02 MED ORDER — VANCOMYCIN HCL IN DEXTROSE 750-5 MG/150ML-% IV SOLN
750.0000 mg | Freq: Two times a day (BID) | INTRAVENOUS | Status: DC
Start: 2017-06-03 — End: 2017-06-02

## 2017-06-02 MED ORDER — IPRATROPIUM BROMIDE 0.03 % NA SOLN
2.0000 | Freq: Three times a day (TID) | NASAL | Status: DC
  Filled 2017-06-02: qty 30

## 2017-06-02 MED ORDER — ACETAMINOPHEN 325 MG PO TABS
650.0000 mg | ORAL_TABLET | Freq: Four times a day (QID) | ORAL | Status: DC | PRN
  Administered 2017-06-08: 650 mg via ORAL
  Filled 2017-06-02: qty 2

## 2017-06-02 MED ORDER — ACETAMINOPHEN 500 MG PO TABS
1000.0000 mg | ORAL_TABLET | Freq: Once | ORAL | Status: AC
  Administered 2017-06-02: 1000 mg via ORAL
  Filled 2017-06-02: qty 2

## 2017-06-02 MED ORDER — VANCOMYCIN HCL IN DEXTROSE 1-5 GM/200ML-% IV SOLN
1000.0000 mg | Freq: Once | INTRAVENOUS | Status: DC
  Filled 2017-06-02: qty 200

## 2017-06-02 MED ORDER — MOMETASONE FURO-FORMOTEROL FUM 200-5 MCG/ACT IN AERO: 2.0000 | INHALATION_SPRAY | Freq: Two times a day (BID) | RESPIRATORY_TRACT | Status: DC

## 2017-06-02 MED ORDER — ENOXAPARIN SODIUM 40 MG/0.4ML ~~LOC~~ SOLN: 40.0000 mg | SUBCUTANEOUS | Status: DC

## 2017-06-02 MED ORDER — TIZANIDINE HCL 4 MG PO TABS
4.0000 mg | ORAL_TABLET | Freq: Three times a day (TID) | ORAL | Status: DC | PRN
  Administered 2017-06-03 – 2017-06-07 (×4): 4 mg via ORAL
  Filled 2017-06-02 (×6): qty 1

## 2017-06-02 MED ORDER — ARTIFICIAL TEARS OPHTHALMIC OINT
1.0000 "application " | TOPICAL_OINTMENT | Freq: Every day | OPHTHALMIC | Status: DC
  Filled 2017-06-02 (×2): qty 3.5

## 2017-06-02 MED ORDER — METHYLPREDNISOLONE SODIUM SUCC 125 MG IJ SOLR
60.0000 mg | Freq: Four times a day (QID) | INTRAMUSCULAR | Status: DC
  Administered 2017-06-02 – 2017-06-03 (×4): 60 mg via INTRAVENOUS
  Filled 2017-06-02 (×4): qty 2

## 2017-06-02 MED ORDER — ALBUTEROL SULFATE (2.5 MG/3ML) 0.083% IN NEBU
3.0000 mL | INHALATION_SOLUTION | Freq: Four times a day (QID) | RESPIRATORY_TRACT | Status: DC | PRN
  Filled 2017-06-02: qty 3

## 2017-06-02 MED ORDER — GUAIFENESIN-DM 100-10 MG/5ML PO SYRP
10.0000 mL | ORAL_SOLUTION | ORAL | Status: DC | PRN
  Administered 2017-06-02 – 2017-06-05 (×4): 10 mL via ORAL
  Filled 2017-06-02 (×4): qty 10

## 2017-06-02 MED ORDER — CARVEDILOL 6.25 MG PO TABS
6.2500 mg | ORAL_TABLET | Freq: Two times a day (BID) | ORAL | Status: DC
  Administered 2017-06-03 – 2017-06-09 (×13): 6.25 mg via ORAL
  Filled 2017-06-02: qty 1
  Filled 2017-06-02: qty 2
  Filled 2017-06-02: qty 1
  Filled 2017-06-02 (×3): qty 2
  Filled 2017-06-02 (×2): qty 1
  Filled 2017-06-02 (×2): qty 2
  Filled 2017-06-02 (×3): qty 1
  Filled 2017-06-02: qty 2

## 2017-06-02 MED ORDER — PANTOPRAZOLE SODIUM 40 MG PO TBEC
40.0000 mg | DELAYED_RELEASE_TABLET | Freq: Every day | ORAL | Status: DC
  Administered 2017-06-03 – 2017-06-09 (×7): 40 mg via ORAL
  Filled 2017-06-02 (×7): qty 1

## 2017-06-02 MED ORDER — SODIUM CHLORIDE 0.9 % IV SOLN
INTRAVENOUS | Status: DC
  Administered 2017-06-02: 19:00:00 via INTRAVENOUS

## 2017-06-02 MED ORDER — ALBUTEROL (5 MG/ML) CONTINUOUS INHALATION SOLN
10.0000 mg/h | INHALATION_SOLUTION | Freq: Once | RESPIRATORY_TRACT | Status: AC
  Administered 2017-06-02: 10 mg/h via RESPIRATORY_TRACT
  Filled 2017-06-02: qty 20

## 2017-06-02 MED ORDER — AMPICILLIN-SULBACTAM SODIUM 3 (2-1) G IJ SOLR
3.0000 g | Freq: Four times a day (QID) | INTRAMUSCULAR | Status: DC
  Administered 2017-06-02 – 2017-06-03 (×3): 3 g via INTRAVENOUS
  Filled 2017-06-02 (×5): qty 3

## 2017-06-02 MED ORDER — ALBUTEROL SULFATE (2.5 MG/3ML) 0.083% IN NEBU: 2.5000 mg | INHALATION_SOLUTION | RESPIRATORY_TRACT | Status: DC

## 2017-06-02 MED ORDER — TOPIRAMATE 100 MG PO TABS
100.0000 mg | ORAL_TABLET | Freq: Two times a day (BID) | ORAL | Status: DC
  Administered 2017-06-03 – 2017-06-08 (×11): 100 mg via ORAL
  Filled 2017-06-02 (×13): qty 1

## 2017-06-02 MED ORDER — METHYLPREDNISOLONE SODIUM SUCC 125 MG IJ SOLR: 60.0000 mg | Freq: Two times a day (BID) | INTRAMUSCULAR | Status: DC

## 2017-06-02 MED ORDER — BUDESONIDE 0.5 MG/2ML IN SUSP
0.5000 mg | Freq: Two times a day (BID) | RESPIRATORY_TRACT | Status: DC
  Administered 2017-06-02 – 2017-06-09 (×14): 0.5 mg via RESPIRATORY_TRACT
  Filled 2017-06-02 (×14): qty 2

## 2017-06-02 MED ORDER — ONDANSETRON HCL 4 MG/2ML IJ SOLN: 4.0000 mg | Freq: Four times a day (QID) | INTRAMUSCULAR | Status: DC | PRN

## 2017-06-02 MED ORDER — IPRATROPIUM BROMIDE 0.02 % IN SOLN
1.0000 mg | Freq: Once | RESPIRATORY_TRACT | Status: AC
  Administered 2017-06-02: 1 mg via RESPIRATORY_TRACT
  Filled 2017-06-02: qty 5

## 2017-06-02 MED ORDER — VANCOMYCIN HCL 10 G IV SOLR
2000.0000 mg | Freq: Once | INTRAVENOUS | Status: AC
  Administered 2017-06-02: 2000 mg via INTRAVENOUS
  Filled 2017-06-02: qty 2000

## 2017-06-02 MED ORDER — METHYLPREDNISOLONE SODIUM SUCC 125 MG IJ SOLR
125.0000 mg | Freq: Once | INTRAMUSCULAR | Status: AC
  Administered 2017-06-02: 125 mg via INTRAVENOUS
  Filled 2017-06-02: qty 2

## 2017-06-02 MED ORDER — IBUPROFEN 200 MG PO TABS: 400.0000 mg | ORAL_TABLET | Freq: Four times a day (QID) | ORAL | Status: DC | PRN

## 2017-06-02 MED ORDER — MORPHINE SULFATE (PF) 4 MG/ML IV SOLN
4.0000 mg | Freq: Once | INTRAVENOUS | Status: AC
  Administered 2017-06-02: 4 mg via INTRAVENOUS
  Filled 2017-06-02: qty 1

## 2017-06-02 MED ORDER — INSULIN GLARGINE 100 UNIT/ML ~~LOC~~ SOLN
25.0000 [IU] | Freq: Every day | SUBCUTANEOUS | Status: DC
  Administered 2017-06-02 – 2017-06-08 (×7): 25 [IU] via SUBCUTANEOUS
  Filled 2017-06-02 (×7): qty 0.25

## 2017-06-02 MED ORDER — ALBUTEROL SULFATE (2.5 MG/3ML) 0.083% IN NEBU: 2.5000 mg | INHALATION_SOLUTION | Freq: Four times a day (QID) | RESPIRATORY_TRACT | Status: DC | PRN

## 2017-06-02 MED ORDER — GABAPENTIN 300 MG PO CAPS
300.0000 mg | ORAL_CAPSULE | Freq: Three times a day (TID) | ORAL | Status: DC
  Administered 2017-06-03 – 2017-06-09 (×19): 300 mg via ORAL
  Filled 2017-06-02 (×19): qty 1

## 2017-06-02 MED ORDER — OXYCODONE-ACETAMINOPHEN 5-325 MG PO TABS: 2.0000 | ORAL_TABLET | Freq: Once | ORAL | Status: DC

## 2017-06-02 MED ORDER — ATORVASTATIN CALCIUM 20 MG PO TABS
20.0000 mg | ORAL_TABLET | Freq: Every day | ORAL | Status: DC
  Administered 2017-06-03 – 2017-06-09 (×7): 20 mg via ORAL
  Filled 2017-06-02 (×7): qty 1

## 2017-06-02 MED ORDER — INSULIN ASPART 100 UNIT/ML ~~LOC~~ SOLN
0.0000 [IU] | Freq: Every day | SUBCUTANEOUS | Status: DC
  Administered 2017-06-02 – 2017-06-03 (×2): 5 [IU] via SUBCUTANEOUS
  Administered 2017-06-04: 4 [IU] via SUBCUTANEOUS
  Administered 2017-06-05: 3 [IU] via SUBCUTANEOUS
  Administered 2017-06-07 – 2017-06-08 (×2): 2 [IU] via SUBCUTANEOUS

## 2017-06-02 MED ORDER — ENOXAPARIN SODIUM 40 MG/0.4ML ~~LOC~~ SOLN
40.0000 mg | SUBCUTANEOUS | Status: DC
Start: 2017-06-02 — End: 2017-06-09
  Administered 2017-06-02 – 2017-06-08 (×7): 40 mg via SUBCUTANEOUS
  Filled 2017-06-02 (×7): qty 0.4

## 2017-06-02 MED ORDER — SODIUM CHLORIDE 0.9 % IV SOLN
3.0000 g | Freq: Four times a day (QID) | INTRAVENOUS | Status: DC
  Filled 2017-06-02 (×2): qty 3

## 2017-06-02 MED ORDER — CHLORHEXIDINE GLUCONATE 0.12% ORAL RINSE (MEDLINE KIT)
15.0000 mL | Freq: Two times a day (BID) | OROMUCOSAL | Status: DC
  Administered 2017-06-02 – 2017-06-09 (×7): 15 mL via OROMUCOSAL

## 2017-06-02 MED ORDER — AMLODIPINE BESYLATE 10 MG PO TABS
10.0000 mg | ORAL_TABLET | Freq: Every day | ORAL | Status: DC
  Administered 2017-06-03 – 2017-06-09 (×7): 10 mg via ORAL
  Filled 2017-06-02 (×7): qty 1

## 2017-06-02 MED ORDER — MORPHINE SULFATE (PF) 2 MG/ML IV SOLN
2.0000 mg | INTRAVENOUS | Status: DC | PRN
  Administered 2017-06-02 – 2017-06-08 (×22): 2 mg via INTRAVENOUS
  Filled 2017-06-02 (×22): qty 1

## 2017-06-02 NOTE — Progress Notes (Signed)
Transport venti O2 device set up for patient.

## 2017-06-02 NOTE — ED Triage Notes (Signed)
Patient here with acute shortness of breath since last night. Has trach and unsure if mucus plug or asthma attack. Has ben suctioning her trach with little out. Patient complains of chest tightness with same. Alert and oriented

## 2017-06-02 NOTE — Progress Notes (Signed)
Patient arrived from ED. Patient having some increased WOB, Currently on 60% ATC, Expiratory wheezing. Received nebulizer in ED prior to arrival. Denies needing suctioning. Complains of chest pain. RN at bedside. Decreased Fio2 to 28%.

## 2017-06-02 NOTE — Progress Notes (Signed)
Pharmacy Antibiotic Note  [name] is a 46 y.o. female admitted on 06/02/2017 with pneumonia.  Pharmacy has been consulted for Cefepime and Vancomycin dosing. SCr 1.14. CrCl ~ 65-70 mL/min. WBC 12.1.   Plan: -Vancomycin 2 gm IV once, then 750 mg IV q 12 hours -Cefepime 1 gm IV Q 8 hours  -Monitor CBC, renal fx, cultures and clinical progress -VT at SS   Height: 5\' 1"  (154.9 cm) Weight: 227 lb (103 kg) IBW/kg (Calculated) : 47.8  Temp (24hrs), Avg:99.2 F (37.3 C), Min:99.2 F (37.3 C), Max:99.2 F (37.3 C)  No results for input(s): WBC, CREATININE, LATICACIDVEN, VANCOTROUGH, VANCOPEAK, VANCORANDOM, GENTTROUGH, GENTPEAK, GENTRANDOM, TOBRATROUGH, TOBRAPEAK, TOBRARND, AMIKACINPEAK, AMIKACINTROU, AMIKACIN in the last 168 hours.  CrCl cannot be calculated (Patient's most recent lab result is older than the maximum 21 days allowed.).    Allergies  Allergen Reactions  . Reglan [Metoclopramide] Other (See Comments)    Panic attack    Antimicrobials this admission: Vanc 9/11 >>  Cefepime 9/11 >>   Dose adjustments this admission: None  Microbiology results: 9/11 BCx:   Thank you for allowing pharmacy to be a part of this patient's care.  Albertina Parr, PharmD., BCPS Clinical Pharmacist Pager [phone]

## 2017-06-02 NOTE — ED Provider Notes (Signed)
[facility] DEPT Provider Note   CSN: [id number] Arrival date & time: 06/02/17  1251     History   Chief Complaint Chief Complaint  Patient presents with  . Shortness of Breath    [name] is a 46 y.o. female.  HPI   46 yo f with PMHx HTN, HLD, OSA with h/o non-adherence, s/p trach, here with severe SOB. History limited 2/2 significant SOB, trach. Per report from husband, pt has been increasingly SOB for the last 2-3 days. Over the past 24 hours, pt has had increasing SOb and wheezing. She has had increased clear secretions from her trach as well. Currently, pt is in obvious respiratory distress, limiting history. She endorses significant CP with coughing as well as severe SOB. She also endorses fever to 101 yesterday. No nausea. She has had poor appetite however. She denies any CP when not coughing. Denies any new LE edema.  Past Medical History:  Diagnosis Date  . Arthritis   . Asthma   . Chronic back pain   . Chronic chest pain   . Depression   . DM (diabetes mellitus) ([place])    INSULIN DEPENDENT  . GERD (gastroesophageal reflux disease)   . Headache(784.0)   . Hypertension   . Hypokalemia     Patient Active Problem List   Diagnosis Date Noted  . Acute respiratory distress 06/02/2017  . Elevated lactic acid level 06/02/2017  . Sepsis ([place]) 05/07/2017  . Cough 05/06/2017  . Normocytic anemia 05/06/2017  . Hypokalemia 05/06/2017  . Tracheostomy status ([place]) 04/28/2017  . Tracheal stenosis 03/04/2017  . Acute blood loss anemia   . Generalized anxiety disorder   . Hypoalbuminemia due to protein-calorie malnutrition ([place])   . Severe asthma with acute exacerbation   . Critical illness myopathy 02/27/2017  . Type 2 diabetes mellitus with diabetic polyneuropathy, without long-term current use of insulin ([place])   . Morbid obesity ([place])   . Chronic pain syndrome   . Critical illness polyneuropathy ([place])   . Acute renal failure ([place])   .  Gastritis and gastroduodenitis 02/22/2016  . Depression 02/22/2016  . Migraine 01/30/2015  . Back pain 09/05/2013  . Essential hypertension 09/05/2013  . Asthma exacerbation 04/27/2013  . Allergic rhinitis, seasonal 08/11/2012  . Chronic cough 08/11/2012  . Sleep apnea, obstructive 12/04/2011    Past Surgical History:  Procedure Laterality Date  . APPENDECTOMY    . CESAREAN SECTION     x 3  . CHOLECYSTECTOMY N/A 03/02/2014   Procedure: LAPAROSCOPIC CHOLECYSTECTOMY;  Surgeon: Joyice Faster. Cornett, MD;  Location: [facility];  Service: General;  Laterality: N/A;  . HERNIA REPAIR    . PANENDOSCOPY N/A 03/04/2017   Procedure: PANENDOSCOPY WITH POSSIBLE FOREIGN BODY REMOVAL;  Surgeon: Jodi Marble, MD;  Location: [facility];  Service: ENT;  Laterality: N/A;  . ROTATOR CUFF REPAIR    . VESICOVAGINAL FISTULA CLOSURE W/ TAH  2009    OB History    No data available       Home Medications    Prior to Admission medications   Medication Sig Start Date End Date Taking? Authorizing Provider  albuterol (PROVENTIL HFA;VENTOLIN HFA) 108 (90 Base) MCG/ACT inhaler Inhale 2 puffs into the lungs every 6 (six) hours as needed for wheezing or shortness of breath. 04/28/17  Yes Arnoldo Morale, MD  albuterol (PROVENTIL) (2.5 MG/3ML) 0.083% nebulizer solution Take 3 mLs (2.5 mg total) by nebulization every 6 (six) hours as needed for wheezing or shortness of breath.  Reported on 02/22/2016 04/28/17  Yes Arnoldo Morale, MD  amLODipine (NORVASC) 10 MG tablet Take 1 tablet (10 mg total) by mouth daily. 05/26/17  Yes Arnoldo Morale, MD  Artificial Tear Ointment (ARTIFICIAL TEARS) ointment Place 1 application into both eyes at bedtime.    Yes [provider]  atorvastatin (LIPITOR) 20 MG tablet Take 1 tablet (20 mg total) by mouth daily. 05/26/17  Yes Arnoldo Morale, MD  Blood Glucose Monitoring Suppl (ACCU-CHEK AVIVA PLUS) w/Device KIT Used to check blood sugars 05/29/17  Yes Renato Shin, MD  carvedilol (COREG) 6.25 MG  tablet Take 1 tablet (6.25 mg total) by mouth 2 (two) times daily with a meal. 05/26/17  Yes Amao, Charlane Ferretti, MD  chlorhexidine gluconate, MEDLINE KIT, (PERIDEX) 0.12 % solution 15 mLs by Mouth Rinse route 2 (two) times daily. 03/05/17  Yes Jennelle Human B, NP  Fluticasone-Salmeterol (ADVAIR DISKUS) 250-50 MCG/DOSE AEPB Inhale 1 puff into the lungs 2 (two) times daily. 05/26/17  Yes Arnoldo Morale, MD  gabapentin (NEURONTIN) 300 MG capsule Take 1 capsule (300 mg total) by mouth 3 (three) times daily. 04/28/17  Yes Arnoldo Morale, MD  glucose blood (ACCU-CHEK AVIVA) test strip 1 each by Other route daily. And lancets 1/day 05/27/17  Yes Renato Shin, MD  guaiFENesin-dextromethorphan [facility] DM) 100-10 MG/5ML syrup Take 10 mLs by mouth every 4 (four) hours as needed for cough. 05/08/17  Yes Aline August, MD  ibuprofen (MOTRIN IB) 200 MG tablet Take 2 tablets (400 mg total) by mouth every 6 (six) hours as needed. 05/08/17  Yes Aline August, MD  Insulin Glargine (LANTUS SOLOSTAR) 100 UNIT/ML Solostar Pen Inject 25 Units into the skin at bedtime. 05/21/17 06/20/17 Yes [provider]  insulin lispro (HUMALOG) 100 UNIT/ML injection Inject 8 Units into the skin 3 (three) times daily with meals. 05/20/17 06/19/17 Yes [provider]  methocarbamol (ROBAXIN) 500 MG tablet Take 500 mg by mouth every 8 (eight) hours as needed for muscle spasms.   Yes [provider]  NOVOLOG FLEXPEN 100 UNIT/ML FlexPen as directed. 05/20/17  Yes [provider]  pantoprazole (PROTONIX) 40 MG tablet Take 40 mg by mouth daily.  03/19/17  Yes [provider]  sertraline (ZOLOFT) 100 MG tablet Place 1 tablet (100 mg total) into feeding tube daily. 04/28/17  Yes Arnoldo Morale, MD  sodium chloride HYPERTONIC 3 % nebulizer solution Take 4 mLs by nebulization 2 (two) times daily. 05/08/17  Yes Aline August, MD  SUMAtriptan (IMITREX) 100 MG tablet Take 100 mg by mouth daily as needed. For migraine   Yes  [provider]  tiZANidine (ZANAFLEX) 4 MG tablet Take 1 tablet (4 mg total) by mouth every 8 (eight) hours as needed for muscle spasms. 05/11/17  Yes Arnoldo Morale, MD  topiramate (TOPAMAX) 100 MG tablet Take 1 tablet (100 mg total) by mouth 2 (two) times daily. 05/26/17  Yes Arnoldo Morale, MD  chlorpheniramine-HYDROcodone (TUSSIONEX PENNKINETIC ER) 10-8 MG/5ML SUER Take 5 mLs by mouth every 12 (twelve) hours as needed for cough. 05/14/17   Arnoldo Morale, MD  Dulaglutide 1.5 MG/0.5ML SOPN Inject 1.5 mg into the skin once a week. 05/27/17   Renato Shin, MD  metFORMIN (GLUCOPHAGE-XR) 500 MG 24 hr tablet Take 1 tablet (500 mg total) by mouth daily with breakfast. 05/29/17   Renato Shin, MD    Family History Family History  Problem Relation Age of Onset  . Heart attack Mother   . Stroke Mother   . Diabetes Mother   .  Hypertension Mother   . Arthritis Mother   . Stroke Father   . Hypertension Sister   . Diabetes Sister   . Seizures Brother   . Diabetes Brother     Social History Social History  Substance Use Topics  . Smoking status: Former Smoker    Packs/day: 0.25    Years: 22.00    Types: Cigarettes  . Smokeless tobacco: Never Used  . Alcohol use No     Comment: socially     Allergies   Reglan [metoclopramide]   Review of Systems Review of Systems  Constitutional: Positive for chills, fatigue and fever.  HENT: Negative for congestion, rhinorrhea and sore throat.   Eyes: Negative for visual disturbance.  Respiratory: Positive for cough, chest tightness and shortness of breath. Negative for wheezing.   Cardiovascular: Positive for chest pain. Negative for leg swelling.  Gastrointestinal: Positive for nausea. Negative for abdominal pain, diarrhea and vomiting.  Genitourinary: Negative for dysuria, flank pain, vaginal bleeding and vaginal discharge.  Musculoskeletal: Negative for neck pain.  Skin: Negative for rash.  Allergic/Immunologic: Negative for  immunocompromised state.  Neurological: Negative for syncope and headaches.  Hematological: Does not bruise/bleed easily.  All other systems reviewed and are negative.    Physical Exam Updated Vital Signs BP (!) 149/104   Pulse (!) 122   Temp 99.2 F (37.3 C) (Oral)   Resp (!) 24   Ht 5' 1"  (1.549 m)   Wt 103 kg (227 lb)   SpO2 99%   BMI 42.89 kg/m   Physical Exam  Constitutional: She is oriented to person, place, and time. She appears well-developed and well-nourished. She appears distressed.  HENT:  Head: Normocephalic and atraumatic.  Eyes: Conjunctivae are normal.  Neck: Neck supple.  Trach c/d/i  Cardiovascular: Regular rhythm and normal heart sounds.  Tachycardia present.  Exam reveals no friction rub.   No murmur heard. Pulmonary/Chest: Accessory muscle usage present. Tachypnea noted. No respiratory distress. She has decreased breath sounds. She has wheezes in the right upper field, the right middle field, the right lower field, the left upper field, the left middle field and the left lower field. She has rhonchi in the right middle field, the right lower field, the left middle field and the left lower field. She has no rales.  Abdominal: She exhibits no distension.  Musculoskeletal: She exhibits no edema.  Neurological: She is alert and oriented to person, place, and time. She exhibits normal muscle tone.  Skin: Skin is warm. Capillary refill takes less than 2 seconds.  Psychiatric: She has a normal mood and affect.  Nursing note and vitals reviewed.    ED Treatments / Results  Labs (all labs ordered are listed, but only abnormal results are displayed) Labs Reviewed  COMPREHENSIVE METABOLIC PANEL - Abnormal; Notable for the following:       Result Value   Potassium 3.1 (*)    CO2 18 (*)    Glucose, Bld 220 (*)    Creatinine, Ser 1.14 (*)    GFR calc non Af Amer 56 (*)    All other components within normal limits  CBC WITH DIFFERENTIAL/PLATELET - Abnormal;  Notable for the following:    WBC 12.1 (*)    Hemoglobin 11.0 (*)    HCT 33.2 (*)    Platelets 424 (*)    Lymphs Abs 6.2 (*)    All other components within normal limits  I-STAT CG4 LACTIC ACID, ED - Abnormal; Notable for the following:  Lactic Acid, Venous 6.24 (*)    All other components within normal limits  I-STAT CG4 LACTIC ACID, ED - Abnormal; Notable for the following:    Lactic Acid, Venous 10.22 (*)    All other components within normal limits  CULTURE, BLOOD (ROUTINE X 2)  CULTURE, BLOOD (ROUTINE X 2)  CULTURE, EXPECTORATED SPUTUM-ASSESSMENT  CULTURE, RESPIRATORY (NON-EXPECTORATED)  URINALYSIS, ROUTINE W REFLEX MICROSCOPIC  BRAIN NATRIURETIC PEPTIDE  HEMOGLOBIN A1C  PROCALCITONIN  BASIC METABOLIC PANEL  CBC  PROCALCITONIN    EKG  EKG Interpretation None       Radiology Ct Angio Chest Pe W Or Wo Contrast  Result Date: 06/02/2017 CLINICAL DATA:  Short of breath chest tightness.  Suspect PE EXAM: CT ANGIOGRAPHY CHEST WITH CONTRAST TECHNIQUE: Multidetector CT imaging of the chest was performed using the standard protocol during bolus administration of intravenous contrast. Multiplanar CT image reconstructions and MIPs were obtained to evaluate the vascular anatomy. CONTRAST:  80 mL Isovue 370 IV COMPARISON:  Chest x-ray 06/02/2017.  Chest CT 05/06/2017 FINDINGS: Image quality degraded by patient motion and large patient size. Cardiovascular: Negative for pulmonary embolism. Negative for aortic aneurysm or dissection mild cardiac enlargement. Mediastinum/Nodes: Right lower peritracheal lymph node 19 mm unchanged from the prior study. Otherwise no adenopathy. Tracheostomy tube in place. Lungs/Pleura: Mild atelectasis in the lung bases. Negative for pneumonia or effusion Upper Abdomen: Fatty infiltration of the liver. Musculoskeletal: No acute skeletal abnormality Review of the MIP images confirms the above findings. IMPRESSION: Negative for pulmonary embolism. Mild bibasilar  atelectasis 19 mm right lower peritracheal lymph node unchanged from the recent study. Electronically Signed   By: Franchot Gallo M.D.   On: 06/02/2017 19:01   Dg Chest Port 1 View  Result Date: 06/02/2017 CLINICAL DATA:  Severe shortness of breath. EXAM: PORTABLE CHEST 1 VIEW COMPARISON:  Chest x-ray dated May 06, 2017. FINDINGS: Unchanged tracheostomy tube with the tip at the thoracic inlet. The cardiomediastinal silhouette is at the upper limits of normal in size. Low lung volumes are present, causing crowding of the pulmonary vasculature. No focal consolidation, pleural effusion, or pneumothorax. No acute osseous abnormality. IMPRESSION: No active disease. Electronically Signed   By: Titus Dubin M.D.   On: 06/02/2017 13:28    Procedures .Critical Care Performed by: Duffy Bruce Authorized by: Duffy Bruce   Critical care provider statement:    Critical care time (minutes):  45   Critical care time was exclusive of:  Separately billable procedures and treating other patients and teaching time   Critical care was necessary to treat or prevent imminent or life-threatening deterioration of the following conditions:  Respiratory failure, circulatory failure, cardiac failure and sepsis   Critical care was time spent personally by me on the following activities:  Development of treatment plan with patient or surrogate, discussions with consultants, evaluation of patient's response to treatment, examination of patient, obtaining history from patient or surrogate, ordering and performing treatments and interventions, ordering and review of laboratory studies, ordering and review of radiographic studies, pulse oximetry, re-evaluation of patient's condition and review of old charts   I assumed direction of critical care for this patient from another provider in my specialty: no     (including critical care time)  Medications Ordered in ED Medications  magnesium sulfate IVPB 2 g 50 mL (2  g Intravenous New Bag/Given 06/02/17 1905)  artificial tears ointment 1 application (not administered)  Insulin Glargine (LANTUS) Solostar Pen 25 Units (not administered)  amLODipine (NORVASC) tablet  10 mg (not administered)  atorvastatin (LIPITOR) tablet 20 mg (20 mg Oral Not Given 06/02/17 1720)  carvedilol (COREG) tablet 6.25 mg (not administered)  mometasone-formoterol (DULERA) 200-5 MCG/ACT inhaler 2 puff (not administered)  topiramate (TOPAMAX) tablet 100 mg (not administered)  tiZANidine (ZANAFLEX) tablet 4 mg (not administered)  guaiFENesin-dextromethorphan (ROBITUSSIN DM) 100-10 MG/5ML syrup 10 mL (10 mLs Oral Given 06/02/17 1908)  ibuprofen (ADVIL,MOTRIN) tablet 400 mg (not administered)  pantoprazole (PROTONIX) EC tablet 40 mg (not administered)  sodium chloride HYPERTONIC 3 % nebulizer solution 4 mL (not administered)  albuterol (PROVENTIL) (2.5 MG/3ML) 0.083% nebulizer solution 3 mL (not administered)  gabapentin (NEURONTIN) capsule 300 mg (not administered)  sertraline (ZOLOFT) tablet 100 mg (100 mg Per Tube Not Given 06/02/17 1737)  chlorhexidine gluconate (MEDLINE KIT) (PERIDEX) 0.12 % solution 15 mL (not administered)  enoxaparin (LOVENOX) injection 40 mg (40 mg Subcutaneous Not Given 06/02/17 1737)  0.9 %  sodium chloride infusion ( Intravenous New Bag/Given 06/02/17 1859)  acetaminophen (TYLENOL) tablet 650 mg (not administered)    Or  acetaminophen (TYLENOL) suppository 650 mg (not administered)  ondansetron (ZOFRAN) tablet 4 mg (not administered)    Or  ondansetron (ZOFRAN) injection 4 mg (not administered)  insulin aspart (novoLOG) injection 0-15 Units (not administered)  insulin aspart (novoLOG) injection 0-5 Units (not administered)  methylPREDNISolone sodium succinate (SOLU-MEDROL) 125 mg/2 mL injection 60 mg (60 mg Intravenous Given 06/02/17 1941)  albuterol (PROVENTIL) (2.5 MG/3ML) 0.083% nebulizer solution 2.5 mg (2.5 mg Nebulization Given 06/02/17 1938)  budesonide  (PULMICORT) nebulizer solution 0.5 mg (not administered)  pantoprazole (PROTONIX) injection 40 mg (not administered)  fluticasone (FLONASE) 50 MCG/ACT nasal spray 2 spray (not administered)  ipratropium (ATROVENT) 0.03 % nasal spray 2 spray (not administered)  Ampicillin-Sulbactam (UNASYN) 3 g in sodium chloride 0.9 % 100 mL IVPB (3 g Intravenous New Bag/Given 06/02/17 1941)  ceFEPIme (MAXIPIME) 2 g in dextrose 5 % 50 mL IVPB (0 g Intravenous Stopped 06/02/17 1411)  albuterol (PROVENTIL,VENTOLIN) solution continuous neb (10 mg/hr Nebulization Given 06/02/17 1333)  ipratropium (ATROVENT) nebulizer solution 1 mg (1 mg Nebulization Given 06/02/17 1333)  methylPREDNISolone sodium succinate (SOLU-MEDROL) 125 mg/2 mL injection 125 mg (125 mg Intravenous Given 06/02/17 1330)  sodium chloride 0.9 % bolus 1,000 mL (0 mLs Intravenous Stopped 06/02/17 1858)  acetaminophen (TYLENOL) tablet 1,000 mg (1,000 mg Oral Given 06/02/17 1518)  vancomycin (VANCOCIN) 2,000 mg in sodium chloride 0.9 % 500 mL IVPB (0 mg Intravenous Stopped 06/02/17 1912)  sodium chloride 0.9 % bolus 1,000 mL (0 mLs Intravenous Stopped 06/02/17 1858)  morphine 4 MG/ML injection 4 mg (4 mg Intravenous Given 06/02/17 1403)  iopamidol (ISOVUE-370) 76 % injection (100 mLs  Contrast Given 06/02/17 1820)  potassium chloride SA (K-DUR,KLOR-CON) CR tablet 40 mEq (40 mEq Oral Given 06/02/17 1909)     Initial Impression / Assessment and Plan / ED Course  I have reviewed the triage vital signs and the nursing notes.  Pertinent labs & imaging results that were available during my care of the patient were reviewed by me and considered in my medical decision making (see chart for details).     46 yo F with extensive PMHx including asthma, OSA s/p trach, HTN, dCHF here with acute on chronic SOB. On exam, pt with diffuse wheezing but also bibasilar rhonchi, and she appears mildly hypervolemic. Primary suspicion is acute asthma exacerbation, though must also  consider tracheitis, pneumonia, CHf exacerbation. Given her reported fevers and tachycardia, will call CODE SEPSIS but will hol  don 30 cc/kg as this would require >3L fluid in this pt with known CHF, already hypervolemic and I am more concerned for her resp status, which may decompensate with over aggressive fluids. She is mentating well, making urine, with no signs of hypoperfusion.  Labs, imaging as above. Pt noted to have lactic acidosis - she has been given fluids, but I suspect this is more so 2/2 her resp effort and WOB. I consulted PCCM who does not feel this is sepsis. They recommend cautious fluids, are OK with ABX, and admit to stepdown under hospitalist. Pt feeling improved in ED. Admit to step down. IV ABX, steroids, fluids given but 30 cc/kg held in setting of CHF, lA likely elevated from WOB and NOT sepsis/shock.  Final Clinical Impressions(s) / ED Diagnoses   Final diagnoses:  Acute respiratory distress  Lactic acidosis  SIRS (systemic inflammatory response syndrome) (HCC)    New Prescriptions New Prescriptions   No medications on file     Duffy Bruce, MD 06/02/17 1956

## 2017-06-02 NOTE — Consult Note (Signed)
PULMONARY / CRITICAL CARE MEDICINE   Name: [name] MRN: [id number] DOB: October 21, 1970    ADMISSION DATE:  06/02/2017 CONSULTATION DATE:  9/11  REFERRING MD:  [email or web address]  CHIEF COMPLAINT:  Respiratory distress   HISTORY OF PRESENT ILLNESS:   This is a 46 year old aaf w/ known h/o asthma and tracheostomy dependence d/t subglottal stenosis. Her subglottal stenosis is being managed at [facility] and they are hopefully working towards decannulation at some point. She presents to the ER on 9/11 w/ several day h/o increased nasal d/c, more after meals, and progressive cough which has been productive of clear sputum and sometimes rarely blood tinged. She had fever on 9/10. As cough progressed she had associated progressive shortness of breath & some chest discomfort. This did not improve w/ rescue BDs so she came to the ER  ->in er CXR clear ->lactic acid elevated, got IVFs, abx and PCCM asked to eval  PAST MEDICAL HISTORY :  She  has a past medical history of Arthritis; Asthma; Chronic back pain; Chronic chest pain; Depression; DM (diabetes mellitus) ([place]); GERD (gastroesophageal reflux disease); Headache(784.0); Hypertension; and Hypokalemia.  PAST SURGICAL HISTORY: She  has a past surgical history that includes Cesarean section; Appendectomy; Vesicovaginal fistula closure w/ TAH (2009); Hernia repair; Cholecystectomy (N/A, 03/02/2014); Rotator cuff repair; and Panendoscopy (N/A, 03/04/2017).  Allergies  Allergen Reactions  . Reglan [Metoclopramide] Other (See Comments)    Panic attack    No current facility-administered medications on file prior to encounter.    Current Outpatient Prescriptions on File Prior to Encounter  Medication Sig  . albuterol (PROVENTIL HFA;VENTOLIN HFA) 108 (90 Base) MCG/ACT inhaler Inhale 2 puffs into the lungs every 6 (six) hours as needed for wheezing or shortness of breath.  Marland Kitchen albuterol (PROVENTIL) (2.5 MG/3ML) 0.083% nebulizer solution Take 3 mLs  (2.5 mg total) by nebulization every 6 (six) hours as needed for wheezing or shortness of breath. Reported on 02/22/2016 (Patient not taking: Reported on 05/14/2017)  . amLODipine (NORVASC) 10 MG tablet Take 1 tablet (10 mg total) by mouth daily.  Marland Kitchen atorvastatin (LIPITOR) 20 MG tablet Take 1 tablet (20 mg total) by mouth daily.  . Blood Glucose Monitoring Suppl (ACCU-CHEK AVIVA PLUS) w/Device KIT Used to check blood sugars  . carvedilol (COREG) 6.25 MG tablet Take 1 tablet (6.25 mg total) by mouth 2 (two) times daily with a meal.  . chlorhexidine gluconate, MEDLINE KIT, (PERIDEX) 0.12 % solution 15 mLs by Mouth Rinse route 2 (two) times daily.  . chlorpheniramine-HYDROcodone (TUSSIONEX PENNKINETIC ER) 10-8 MG/5ML SUER Take 5 mLs by mouth every 12 (twelve) hours as needed for cough.  . Dulaglutide 1.5 MG/0.5ML SOPN Inject 1.5 mg into the skin once a week.  . Fluticasone-Salmeterol (ADVAIR DISKUS) 250-50 MCG/DOSE AEPB Inhale 1 puff into the lungs 2 (two) times daily.  Marland Kitchen gabapentin (NEURONTIN) 300 MG capsule Take 1 capsule (300 mg total) by mouth 3 (three) times daily.  Marland Kitchen glucose blood (ACCU-CHEK AVIVA) test strip 1 each by Other route daily. And lancets 1/day  . guaiFENesin-dextromethorphan (ROBITUSSIN DM) 100-10 MG/5ML syrup Take 10 mLs by mouth every 4 (four) hours as needed for cough.  Marland Kitchen ibuprofen (MOTRIN IB) 200 MG tablet Take 2 tablets (400 mg total) by mouth every 6 (six) hours as needed.  . metFORMIN (GLUCOPHAGE-XR) 500 MG 24 hr tablet Take 1 tablet (500 mg total) by mouth daily with breakfast.  . pantoprazole (PROTONIX) 40 MG tablet Take 40 mg by mouth.  . sertraline (ZOLOFT)  100 MG tablet Place 1 tablet (100 mg total) into feeding tube daily.  . sodium chloride HYPERTONIC 3 % nebulizer solution Take 4 mLs by nebulization 2 (two) times daily.  Marland Kitchen tiZANidine (ZANAFLEX) 4 MG tablet Take 1 tablet (4 mg total) by mouth every 8 (eight) hours as needed for muscle spasms.  Marland Kitchen topiramate (TOPAMAX) 100 MG  tablet Take 1 tablet (100 mg total) by mouth 2 (two) times daily.  . [DISCONTINUED] promethazine (PHENERGAN) 25 MG tablet Take 1 tablet (25 mg total) by mouth every 6 (six) hours as needed for nausea. (Patient not taking: Reported on 12/13/2014)    FAMILY HISTORY:  Her indicated that her mother is deceased. She indicated that her father is deceased.    SOCIAL HISTORY: She  reports that she has quit smoking. Her smoking use included Cigarettes. She has a 5.50 pack-year smoking history. She has never used smokeless tobacco. She reports that she does not drink alcohol or use drugs.  REVIEW OF SYSTEMS:   Review of Systems:   Bolds are positive  Constitutional: weight loss, gain, night sweats, Fevers, chills, fatigue .  HEENT: headaches, Sore throat, sneezing, nasal congestion, post nasal drip, specifically after she eats, Difficulty swallowing, Tooth/dental problems, visual complaints visual changes, ear ache CV:  chest pain, radiates:,Orthopnea, PND, swelling in lower extremities, dizziness, palpitations, syncope.  GI  heartburn, indigestion, abdominal pain, nausea, vomiting, diarrhea, change in bowel habits, loss of appetite, bloody stools.  Resp: cough, productive:clear  , hemoptysis, dyspnea worse , chest pain, pleuritic.  Skin: rash or itching or icterus GU: dysuria, change in color of urine, urgency or frequency. flank pain, hematuria  MS: joint pain or swelling. decreased range of motion  Psych: change in mood or affect. depression or anxiety.  Neuro: difficulty with speech, weakness, numbness, ataxia   SUBJECTIVE:  Feels better  VITAL SIGNS: BP (!) 148/79   Pulse (!) 134   Temp 99.2 F (37.3 C) (Oral)   Resp (!) 40   Ht 5' 1"  (1.549 m)   Wt 227 lb (103 kg)   SpO2 98%   BMI 42.89 kg/m   HEMODYNAMICS:    VENTILATOR SETTINGS:    INTAKE / OUTPUT: No intake/output data recorded.  PHYSICAL EXAMINATION: General appearance:  46 Year old obese female, NAD, does have some  increased wob Eyes: anicteric sclerae, moist conjunctivae; PERRL, EOMI bilaterally. Mouth:  membranes and no mucosal ulcerations; normal hard and soft palate Neck: Trachea midline; neck supple, no JVD, trach unremarkable  Lungs/chest:occ wheeze specifically after cough, with normal respiratory effort and no intercostal retractions CV: RRR, no MRGs  Abdomen: Soft, non-tender; no masses or HSM Extremities: No peripheral edema or extremity lymphadenopathy Skin: Normal temperature, turgor and texture; no rash, ulcers or subcutaneous nodules Psych: Appropriate affect, alert and oriented to person, place and time   LABS:  BMET  Recent Labs Lab 06/02/17 1330  NA 138  K 3.1*  CL 105  CO2 18*  BUN 11  CREATININE 1.14*  GLUCOSE 220*    Electrolytes  Recent Labs Lab 06/02/17 1330  CALCIUM 9.4    CBC  Recent Labs Lab 06/02/17 1330  WBC 12.1*  HGB 11.0*  HCT 33.2*  PLT 424*    Coag's No results for input(s): APTT, INR in the last 168 hours.  Sepsis Markers  Recent Labs Lab 06/02/17 1340  LATICACIDVEN 6.24*    ABG No results for input(s): PHART, PCO2ART, PO2ART in the last 168 hours.  Liver Enzymes  Recent Labs  Lab 06/02/17 1330  AST 21  ALT 16  ALKPHOS 100  BILITOT 0.4  ALBUMIN 3.7    Cardiac Enzymes No results for input(s): TROPONINI, PROBNP in the last 168 hours.  Glucose No results for input(s): GLUCAP in the last 168 hours.  Imaging Dg Chest Port 1 View  Result Date: 06/02/2017 CLINICAL DATA:  Severe shortness of breath. EXAM: PORTABLE CHEST 1 VIEW COMPARISON:  Chest x-ray dated May 06, 2017. FINDINGS: Unchanged tracheostomy tube with the tip at the thoracic inlet. The cardiomediastinal silhouette is at the upper limits of normal in size. Low lung volumes are present, causing crowding of the pulmonary vasculature. No focal consolidation, pleural effusion, or pneumothorax. No acute osseous abnormality. IMPRESSION: No active disease.  Electronically Signed   By: Titus Dubin M.D.   On: 06/02/2017 13:28     STUDIES:  CT chest 9/11>>>  CULTURES: Sputum 9/11>>>  ANTIBIOTICS: vanc 8/11>>> fortaz 8/11>>>  SIGNIFICANT EVENTS:   LINES/TUBES:   ASSESSMENT / PLAN:  Acute on chronic respiratory failure Asthmatic excerebration Non-allergic/gustatory rhinitis Tracheostomy dependence d/t subglottal  stenosis  GERD Possible URI SIRS/sepsis (NOT SHOCK) Lactic acidosis  Diabetes   Acute on chronic respiratory failure in setting of asthmatic exacerbation Seemingly exacerbated by increased nasal drainage. -likely triggers: non-allergic/gustatory rhinitis, probable reflux and possible URI.  -cxr does not show PNA  Plan/rec Admit to SDU Supplemental oxygen  Pulse ox Scheduled BDs Systemic steroids  PPI bid Also add nasal Atrovent prior to meals (would help w/ gustatory rhinitis) BID flonase (in case allergic component) CT chest reasonable to r/o PE  Cont nosocomial coverage but rapidly taper  SIRS/sepsis w/ lactic acidosis.  This is NOT septic shock; her lactic acidosis is a product of her respiratory distress which is improving. She has received her crystalloid fluid challenge and IV antibiotics Plan Cont IVFs Nosocomial coverage reasonable as just had day surgery 8/27 but suspect we can narrow to azith quickly Get sputum culture Taper systemic steroids  Tracheostomy dependence d/t subglottal stenosis.  -followed at [facility] by ENT. Working towards possible decannulation at some point.  Plan Cont routine trach care Will need to f/u at [facility]   Mild AKI Plan Cont IVFs Repeat chem in am  DM Plan ssi   Mild anemia  Plan [place] heparin  Trend cbg  She can be admitted to the SDU setting. She does not have shock & her lactic acidosis is a by-product of her respiratory distress & NOT septic shock. Non-the less she has had her IV antibiotics and fluid challenge and post-resuscitation assessment Sepsis -  Repeat Assessment Performed at: 1530 Vitals     Blood pressure (!) 148/79, pulse (!) 134, temperature 99.2 F (37.3 C), temperature source Oral, resp. rate (!) 40, height 5' 1"  (1.549 m), weight 227 lb (103 kg), SpO2 98 %. Heart:     Regular rate and rhythm Lungs:    Wheezing Capillary Refill:   <2 sec Peripheral Pulse:   Radial pulse palpable Skin:     Normal Color She can be admitted to the SDU setting. PCCM will follow for pulmonary recommendations  Erick Colace ACNP-BC [place] Pager # [phone] OR # [phone] if no answer    06/02/2017, 3:43 PM  f

## 2017-06-02 NOTE — Progress Notes (Signed)
Pharmacy Antibiotic Note  [name] is a 46 y.o. female admitted on 06/02/2017 with pneumonia.  Pharmacy initially consulted for Cefepime and Vancomycin dosing. Patient received one dose in the ED, but now pharmacy being consulted start Unasyn. SCr 1.14. CrCl ~ 65-70 mL/min. WBC 12.1.   Plan: -Unasyn 3G IV q6 hours -Monitor CBC, renal fx, cultures and clinical progress  Height: 5\' 1"  (154.9 cm) Weight: 227 lb (103 kg) IBW/kg (Calculated) : 47.8  Temp (24hrs), Avg:99.2 F (37.3 C), Min:99.2 F (37.3 C), Max:99.2 F (37.3 C)   Recent Labs Lab 06/02/17 1330 06/02/17 1340  WBC 12.1*  --   CREATININE 1.14*  --   LATICACIDVEN  --  6.24*    Estimated Creatinine Clearance: 67.3 mL/min (A) (by C-G formula based on SCr of 1.14 mg/dL (H)).    Allergies  Allergen Reactions  . Reglan [Metoclopramide] Other (See Comments)    Panic attack    Antimicrobials this admission: Vanc 9/11 x1 Cefepime 9/11x1  Unasyn 9/11>>  Dose adjustments this admission: None  Microbiology results: 9/11 BCx:   Thank you for allowing pharmacy to be a part of this patient's care.  Georga Bora, PharmD Clinical Pharmacist 06/02/2017 5:19 PM

## 2017-06-02 NOTE — H&P (Signed)
History and Physical    [name] TFT:[id number] DOB: August 26, 1971 DOA: 06/02/2017  PCP: Arnoldo Morale, MD Patient coming from: home  Chief Complaint: sob  HPI: [name] is a 46 y.o. female with medical history significant of asthma, obstructive sleep apnea noncompliant, hypertension, diabetes, morbid obesity with recent intubation for asthma resulting in intubation MG and now tracheostomy dependent presents with worsening shortness of breath. She is evaluated by critical care/pulmonology who opined asthmatic exacerbation likely related to increased nasal drainage triggered by rhinitis probable reflux and possible upper respiratory infection.  Information is obtained from the patient and the chart. 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 She reports she goes to [facility] for management of subglottic stenosis. She reports a several-day history of increased nasal discharge, cough with clear sputum. She reports a fever yesterday as high as 101. She states her cough is worsened leading to worsening shortness of breath. She used her rescue nebulizers with little relief so she came to the emergency department. She denies headache dizziness syncope or near-syncope. She denies abdominal pain nausea vomiting diarrhea. She denies dysuria hematuria frequency or urgency.    ED Course: In the emergency department temperature 99.2 tachycardia with tachypnea oxygen saturation level 98% on Tbar. She is provided with nebulizer 125 mg of Solu-Medrol antibiotics fluid resuscitation  Review of Systems: As per HPI otherwise all other systems reviewed and are negative.   Ambulatory Status: Ambulates independently recent falls  Past Medical History:  Diagnosis Date  . Arthritis   . Asthma   . Chronic back pain   . Chronic chest pain   . Depression   . DM (diabetes mellitus) ([place])    INSULIN DEPENDENT    . GERD (gastroesophageal reflux disease)   . Headache(784.0)   . Hypertension   . Hypokalemia     Past Surgical History:  Procedure Laterality Date  . APPENDECTOMY    . CESAREAN SECTION     x 3  . CHOLECYSTECTOMY N/A 03/02/2014   Procedure: LAPAROSCOPIC CHOLECYSTECTOMY;  Surgeon: Joyice Faster. Cornett, MD;  Location: [facility];  Service: General;  Laterality: N/A;  . HERNIA REPAIR    . PANENDOSCOPY N/A 03/04/2017   Procedure: PANENDOSCOPY WITH POSSIBLE FOREIGN BODY REMOVAL;  Surgeon: Jodi Marble, MD;  Location: [facility];  Service: ENT;  Laterality: N/A;  . ROTATOR CUFF REPAIR    . VESICOVAGINAL FISTULA CLOSURE W/ TAH  2009    Social History   Social History  . Marital status: Married    Spouse name: N/A  . Number of children: N/A  . Years of education: N/A   Occupational History  . Not on file.   Social History Main Topics  . Smoking status: Former Smoker    Packs/day: 0.25    Years: 22.00    Types: Cigarettes  . Smokeless tobacco: Never Used  . Alcohol use No     Comment: socially  . Drug use: No  . Sexual activity: Yes    Partners: Male   Other Topics Concern  . Not on file   Social History Narrative  . No narrative on file   Lives with her husband Allergies  Allergen Reactions  . Reglan [Metoclopramide] Other (See Comments)    Panic attack    Family History  Problem Relation Age of Onset  . Heart attack Mother   . Stroke Mother   . Diabetes Mother   . Hypertension Mother   .  Arthritis Mother   . Stroke Father   . Hypertension Sister   . Diabetes Sister   . Seizures Brother   . Diabetes Brother     Prior to Admission medications   Medication Sig Start Date End Date Taking? Authorizing Provider  albuterol (PROVENTIL HFA;VENTOLIN HFA) 108 (90 Base) MCG/ACT inhaler Inhale 2 puffs into the lungs every 6 (six) hours as needed for wheezing or shortness of breath. 04/28/17  Yes Arnoldo Morale, MD  albuterol (PROVENTIL) (2.5 MG/3ML) 0.083% nebulizer solution  Take 3 mLs (2.5 mg total) by nebulization every 6 (six) hours as needed for wheezing or shortness of breath. Reported on 02/22/2016 04/28/17  Yes Arnoldo Morale, MD  amLODipine (NORVASC) 10 MG tablet Take 1 tablet (10 mg total) by mouth daily. 05/26/17  Yes Arnoldo Morale, MD  Artificial Tear Ointment (ARTIFICIAL TEARS) ointment Place 1 application into both eyes at bedtime.    Yes [provider]  atorvastatin (LIPITOR) 20 MG tablet Take 1 tablet (20 mg total) by mouth daily. 05/26/17  Yes Arnoldo Morale, MD  Blood Glucose Monitoring Suppl (ACCU-CHEK AVIVA PLUS) w/Device KIT Used to check blood sugars 05/29/17  Yes Renato Shin, MD  carvedilol (COREG) 6.25 MG tablet Take 1 tablet (6.25 mg total) by mouth 2 (two) times daily with a meal. 05/26/17  Yes Amao, Charlane Ferretti, MD  chlorhexidine gluconate, MEDLINE KIT, (PERIDEX) 0.12 % solution 15 mLs by Mouth Rinse route 2 (two) times daily. 03/05/17  Yes Jennelle Human B, NP  Fluticasone-Salmeterol (ADVAIR DISKUS) 250-50 MCG/DOSE AEPB Inhale 1 puff into the lungs 2 (two) times daily. 05/26/17  Yes Arnoldo Morale, MD  gabapentin (NEURONTIN) 300 MG capsule Take 1 capsule (300 mg total) by mouth 3 (three) times daily. 04/28/17  Yes Arnoldo Morale, MD  glucose blood (ACCU-CHEK AVIVA) test strip 1 each by Other route daily. And lancets 1/day 05/27/17  Yes Renato Shin, MD  guaiFENesin-dextromethorphan [facility] DM) 100-10 MG/5ML syrup Take 10 mLs by mouth every 4 (four) hours as needed for cough. 05/08/17  Yes Aline August, MD  ibuprofen (MOTRIN IB) 200 MG tablet Take 2 tablets (400 mg total) by mouth every 6 (six) hours as needed. 05/08/17  Yes Aline August, MD  Insulin Glargine (LANTUS SOLOSTAR) 100 UNIT/ML Solostar Pen Inject 25 Units into the skin at bedtime. 05/21/17 06/20/17 Yes [provider]  insulin lispro (HUMALOG) 100 UNIT/ML injection Inject 8 Units into the skin 3 (three) times daily with meals. 05/20/17 06/19/17 Yes [provider]  methocarbamol  (ROBAXIN) 500 MG tablet Take 500 mg by mouth every 8 (eight) hours as needed for muscle spasms.   Yes [provider]  NOVOLOG FLEXPEN 100 UNIT/ML FlexPen as directed. 05/20/17  Yes [provider]  pantoprazole (PROTONIX) 40 MG tablet Take 40 mg by mouth daily.  03/19/17  Yes [provider]  sertraline (ZOLOFT) 100 MG tablet Place 1 tablet (100 mg total) into feeding tube daily. 04/28/17  Yes Arnoldo Morale, MD  sodium chloride HYPERTONIC 3 % nebulizer solution Take 4 mLs by nebulization 2 (two) times daily. 05/08/17  Yes Aline August, MD  SUMAtriptan (IMITREX) 100 MG tablet Take 100 mg by mouth daily as needed. For migraine   Yes [provider]  tiZANidine (ZANAFLEX) 4 MG tablet Take 1 tablet (4 mg total) by mouth every 8 (eight) hours as needed for muscle spasms. 05/11/17  Yes Arnoldo Morale, MD  topiramate (TOPAMAX) 100 MG tablet Take 1 tablet (100 mg total) by mouth 2 (two) times daily.  05/26/17  Yes Arnoldo Morale, MD  chlorpheniramine-HYDROcodone (TUSSIONEX PENNKINETIC ER) 10-8 MG/5ML SUER Take 5 mLs by mouth every 12 (twelve) hours as needed for cough. 05/14/17   Arnoldo Morale, MD  Dulaglutide 1.5 MG/0.5ML SOPN Inject 1.5 mg into the skin once a week. 05/27/17   Renato Shin, MD  metFORMIN (GLUCOPHAGE-XR) 500 MG 24 hr tablet Take 1 tablet (500 mg total) by mouth daily with breakfast. 05/29/17   Renato Shin, MD    Physical Exam: Vitals:   06/02/17 1333 06/02/17 1335 06/02/17 1343 06/02/17 1415  BP:    (!) 148/79  Pulse: (!) 110  (!) 116 (!) 134  Resp: (!) 22  19 (!) 40  Temp:      TempSrc:      SpO2: 100% 100% 100% 98%  Weight:      Height:         General:  Appears calm and comfortable Head of bed up in no acute distress Eyes:  PERRL, EOMI, normal lids, iris ENT:  grossly normal hearing, lips & tongue, mucous membranes of her mouth are moist and pink Neck:  no LAD, masses or thyromegaly Cardiovascular:  RRR, no m/r/g. No LE edema.  Respiratory:   Trachea intact mild increased work of breathing at rest. Intermittent moist productive cough good air movement breath sounds somewhat coarse and expiratory wheezing Abdomen:  soft, ntnd, obese positive bowel sounds Skin:  no rash or induration seen on limited exam Musculoskeletal:  grossly normal tone BUE/BLE, good ROM, no bony abnormality Psychiatric:  grossly normal mood and affect, speech fluent and appropriate, AOx3 Neurologic:  CN 2-12 grossly intact, moves all extremities in coordinated fashion, sensation intact  Labs on Admission: I have personally reviewed following labs and imaging studies  CBC:  Recent Labs Lab 06/02/17 1330  WBC 12.1*  NEUTROABS 5.2  HGB 11.0*  HCT 33.2*  MCV 83.4  PLT 300*   Basic Metabolic Panel:  Recent Labs Lab 06/02/17 1330  NA 138  K 3.1*  CL 105  CO2 18*  GLUCOSE 220*  BUN 11  CREATININE 1.14*  CALCIUM 9.4   GFR: Estimated Creatinine Clearance: 67.3 mL/min (A) (by C-G formula based on SCr of 1.14 mg/dL (H)). Liver Function Tests:  Recent Labs Lab 06/02/17 1330  AST 21  ALT 16  ALKPHOS 100  BILITOT 0.4  PROT 7.7  ALBUMIN 3.7   No results for input(s): LIPASE, AMYLASE in the last 168 hours. No results for input(s): AMMONIA in the last 168 hours. Coagulation Profile: No results for input(s): INR, PROTIME in the last 168 hours. Cardiac Enzymes: No results for input(s): CKTOTAL, CKMB, CKMBINDEX, TROPONINI in the last 168 hours. BNP (last 3 results) No results for input(s): PROBNP in the last 8760 hours. HbA1C: No results for input(s): HGBA1C in the last 72 hours. CBG: No results for input(s): GLUCAP in the last 168 hours. Lipid Profile: No results for input(s): CHOL, HDL, LDLCALC, TRIG, CHOLHDL, LDLDIRECT in the last 72 hours. Thyroid Function Tests: No results for input(s): TSH, T4TOTAL, FREET4, T3FREE, THYROIDAB in the last 72 hours. Anemia Panel: No results for input(s): VITAMINB12, FOLATE, FERRITIN, TIBC, IRON,  RETICCTPCT in the last 72 hours. Urine analysis:    Component Value Date/Time   COLORURINE STRAW (A) 05/07/2017 0038   APPEARANCEUR CLEAR 05/07/2017 0038   LABSPEC 1.038 (H) 05/07/2017 0038   PHURINE 6.0 05/07/2017 0038   GLUCOSEU NEGATIVE 05/07/2017 0038   HGBUR NEGATIVE 05/07/2017 0038   BILIRUBINUR NEGATIVE 05/07/2017 0038  BILIRUBINUR neg 10/29/2015 1256   KETONESUR NEGATIVE 05/07/2017 0038   PROTEINUR NEGATIVE 05/07/2017 0038   UROBILINOGEN 0.2 10/29/2015 1256   UROBILINOGEN 0.2 11/18/2014 2325   NITRITE NEGATIVE 05/07/2017 0038   LEUKOCYTESUR NEGATIVE 05/07/2017 0038    Creatinine Clearance: Estimated Creatinine Clearance: 67.3 mL/min (A) (by C-G formula based on SCr of 1.14 mg/dL (H)).  Sepsis Labs: @LABRCNTIP (procalcitonin:4,lacticidven:4) )No results found for this or any previous visit (from the past 240 hour(s)).   Radiological Exams on Admission: Dg Chest Port 1 View  Result Date: 06/02/2017 CLINICAL DATA:  Severe shortness of breath. EXAM: PORTABLE CHEST 1 VIEW COMPARISON:  Chest x-ray dated May 06, 2017. FINDINGS: Unchanged tracheostomy tube with the tip at the thoracic inlet. The cardiomediastinal silhouette is at the upper limits of normal in size. Low lung volumes are present, causing crowding of the pulmonary vasculature. No focal consolidation, pleural effusion, or pneumothorax. No acute osseous abnormality. IMPRESSION: No active disease. Electronically Signed   By: Titus Dubin M.D.   On: 06/02/2017 13:28    EKG: Independently reviewed. Accelerated Junctional rhythmCannot rule out Anterior infarct , age undetermined  Assessment/Plan Principal Problem:   Acute respiratory distress Active Problems:   Sleep apnea, obstructive   Essential hypertension   Type 2 diabetes mellitus with diabetic polyneuropathy, without long-term current use of insulin (HCC)   Morbid obesity (HCC)   Chronic pain syndrome   Severe asthma with acute exacerbation    Generalized anxiety disorder   Tracheostomy status (HCC)   Hypokalemia   Elevated lactic acid level   #1. Acute respiratory distress in the setting of acute asthma exacerbation related to nonallergic/gustatory rhinitis in tracheostomy dependent patient. Evaluated by critical care recommend step down admission per medicine -Admit to step down -Management per critical care medicine -Continue Solu-Medrol -Continue oxygen supplementation -Nebulizers -PPI -Nasal Atrovent -Follow chest CT to rule out PE -Continue antibiotics per [facility] M recommendation  #2. Severe asthma with acute exacerbation. See #1. X-ray with no active disease. -See #1  #3. Elevated lactic acid/surge/sepsis. Code sepsis was called. Lactic acid 6.24. She was provided with fluid resuscitation in the emergency department as well as antibiotics for pneumonia. Evaluated by critical care medicine who opined this is not septic shock and elevated lactic acidosis is likely a product of respiratory distress. -Continue IV fluids -Continue antibiotics per critical care recommendation. Of note patient just had day surgery 827. Critical care suspect we can narrow to azithromycin quickly -Follow sputum culture  #4. Diabetes. Glucose 220. Home regimen includes Lantus -Continue Lantus -Hold oral agents -Obtain hemoglobin A1c -Sliding scale for optimal control  5. Hypertension. The pressure high end of normal on admission home medications include amlodipine -Continue home meds  #6. Acute kidney injury. Mild. -Gentle IV fluids -Hold nephrotoxins -Monitor urine output -Recheck in the morning  #7. Hypokalemia. Potassium 3.1 on admission. Magnesium provided in the emergency department. -[place]  #8. Anemia. Likely related to chronic disease. Hemoglobin 11 on admission. This appears to be close or above her baseline -Monitor    DVT prophylaxis: lovenox  Code Status: full  Family Communication: husband at bedside    Disposition Plan: home  Consults called: pete babcock Marshell Garfinkel md Admission status: inpatient   Radene Gunning MD [facility]  If 7PM-7AM, please contact night-coverage [email or web address] Password [email or web address]  06/02/2017, 4:45 PM

## 2017-06-03 ENCOUNTER — Encounter (HOSPITAL_COMMUNITY): Payer: Self-pay | Admitting: General Practice

## 2017-06-03 ENCOUNTER — Inpatient Hospital Stay (HOSPITAL_COMMUNITY): Payer: Medicare Other

## 2017-06-03 DIAGNOSIS — E872 Acidosis: Secondary | ICD-10-CM

## 2017-06-03 DIAGNOSIS — I10 Essential (primary) hypertension: Secondary | ICD-10-CM

## 2017-06-03 DIAGNOSIS — E1142 Type 2 diabetes mellitus with diabetic polyneuropathy: Secondary | ICD-10-CM

## 2017-06-03 LAB — BASIC METABOLIC PANEL
Anion gap: 8 (ref 5–15)
BUN: 11 mg/dL (ref 6–20)
CALCIUM: 8.9 mg/dL (ref 8.9–10.3)
CO2: 19 mmol/L — ABNORMAL LOW (ref 22–32)
CREATININE: 1.12 mg/dL — AB (ref 0.44–1.00)
Chloride: 109 mmol/L (ref 101–111)
GFR calc Af Amer: >60 mL/min (ref >60)
GFR calc non Af Amer: 58 mL/min — ABNORMAL LOW (ref >60)
GLUCOSE: 324 mg/dL — AB (ref 65–99)
Potassium: 5 mmol/L (ref 3.5–5.1)
Sodium: 136 mmol/L (ref 135–145)

## 2017-06-03 LAB — BLOOD CULTURE ID PANEL (REFLEXED)
ACINETOBACTER BAUMANNII: NOT DETECTED
CANDIDA TROPICALIS: NOT DETECTED
Candida albicans: NOT DETECTED
Candida glabrata: NOT DETECTED
Candida krusei: NOT DETECTED
Candida parapsilosis: NOT DETECTED
Carbapenem resistance: NOT DETECTED
ENTEROBACTER CLOACAE COMPLEX: NOT DETECTED
ESCHERICHIA COLI: NOT DETECTED
Enterobacteriaceae species: NOT DETECTED
Enterococcus species: NOT DETECTED
HAEMOPHILUS INFLUENZAE: NOT DETECTED
Klebsiella oxytoca: NOT DETECTED
Klebsiella pneumoniae: NOT DETECTED
LISTERIA MONOCYTOGENES: NOT DETECTED
METHICILLIN RESISTANCE: DETECTED — AB
NEISSERIA MENINGITIDIS: NOT DETECTED
PROTEUS SPECIES: NOT DETECTED
Pseudomonas aeruginosa: NOT DETECTED
SERRATIA MARCESCENS: NOT DETECTED
STAPHYLOCOCCUS AUREUS BCID: NOT DETECTED
STREPTOCOCCUS PYOGENES: NOT DETECTED
STREPTOCOCCUS SPECIES: NOT DETECTED
Staphylococcus species: DETECTED — AB
Streptococcus agalactiae: NOT DETECTED
Streptococcus pneumoniae: NOT DETECTED
Vancomycin resistance: NOT DETECTED

## 2017-06-03 LAB — URINALYSIS, ROUTINE W REFLEX MICROSCOPIC
Bilirubin Urine: NEGATIVE
Glucose, UA: >=500 mg/dL — AB
Hgb urine dipstick: NEGATIVE
KETONES UR: 5 mg/dL — AB
Leukocytes, UA: NEGATIVE
Nitrite: NEGATIVE
Protein, ur: NEGATIVE mg/dL
SPECIFIC GRAVITY, URINE: 1.043 — AB (ref 1.005–1.030)
pH: 5 (ref 5.0–8.0)

## 2017-06-03 LAB — GLUCOSE, CAPILLARY
GLUCOSE-CAPILLARY: 379 mg/dL — AB (ref 65–99)
GLUCOSE-CAPILLARY: 348 mg/dL — AB (ref 65–99)
Glucose-Capillary: 295 mg/dL — ABNORMAL HIGH (ref 65–99)
Glucose-Capillary: 359 mg/dL — ABNORMAL HIGH (ref 65–99)

## 2017-06-03 LAB — LACTATE DEHYDROGENASE: LDH: 173 U/L (ref 98–192)

## 2017-06-03 LAB — CBC
HCT: 30 % — ABNORMAL LOW (ref 36.0–46.0)
Hemoglobin: 9.5 g/dL — ABNORMAL LOW (ref 12.0–15.0)
MCH: 26.5 pg (ref 26.0–34.0)
MCHC: 31.7 g/dL (ref 30.0–36.0)
MCV: 83.8 fL (ref 78.0–100.0)
PLATELETS: 381 10*3/uL (ref 150–400)
RBC: 3.58 MIL/uL — ABNORMAL LOW (ref 3.87–5.11)
RDW: 14.8 % (ref 11.5–15.5)
WBC: 14.4 10*3/uL — ABNORMAL HIGH (ref 4.0–10.5)

## 2017-06-03 LAB — LACTIC ACID, PLASMA: Lactic Acid, Venous: 3.2 mmol/L (ref 0.5–1.9)

## 2017-06-03 LAB — PATHOLOGIST SMEAR REVIEW

## 2017-06-03 LAB — PROCALCITONIN

## 2017-06-03 MED ORDER — IPRATROPIUM-ALBUTEROL 0.5-2.5 (3) MG/3ML IN SOLN: 3.0000 mL | Freq: Four times a day (QID) | RESPIRATORY_TRACT | Status: DC

## 2017-06-03 MED ORDER — IPRATROPIUM-ALBUTEROL 0.5-2.5 (3) MG/3ML IN SOLN
3.0000 mL | RESPIRATORY_TRACT | Status: DC | PRN
  Filled 2017-06-03: qty 3

## 2017-06-03 MED ORDER — ALBUTEROL SULFATE (2.5 MG/3ML) 0.083% IN NEBU
2.5000 mg | INHALATION_SOLUTION | Freq: Three times a day (TID) | RESPIRATORY_TRACT | Status: DC
  Administered 2017-06-03: 2.5 mg via RESPIRATORY_TRACT
  Filled 2017-06-03: qty 3

## 2017-06-03 MED ORDER — METHYLPREDNISOLONE SODIUM SUCC 40 MG IJ SOLR
40.0000 mg | Freq: Three times a day (TID) | INTRAMUSCULAR | Status: DC
  Administered 2017-06-03: 40 mg via INTRAVENOUS
  Filled 2017-06-03: qty 1

## 2017-06-03 MED ORDER — VANCOMYCIN HCL IN DEXTROSE 750-5 MG/150ML-% IV SOLN
750.0000 mg | Freq: Two times a day (BID) | INTRAVENOUS | Status: DC
  Administered 2017-06-03: 750 mg via INTRAVENOUS
  Filled 2017-06-03: qty 150

## 2017-06-03 MED ORDER — IPRATROPIUM-ALBUTEROL 0.5-2.5 (3) MG/3ML IN SOLN
3.0000 mL | Freq: Two times a day (BID) | RESPIRATORY_TRACT | Status: DC
  Administered 2017-06-03 – 2017-06-09 (×12): 3 mL via RESPIRATORY_TRACT
  Filled 2017-06-03 (×11): qty 3

## 2017-06-03 MED ORDER — INSULIN ASPART 100 UNIT/ML ~~LOC~~ SOLN
0.0000 [IU] | Freq: Three times a day (TID) | SUBCUTANEOUS | Status: DC
  Administered 2017-06-03: 20 [IU] via SUBCUTANEOUS
  Administered 2017-06-04: 11 [IU] via SUBCUTANEOUS
  Administered 2017-06-04: 20 [IU] via SUBCUTANEOUS
  Administered 2017-06-04: 7 [IU] via SUBCUTANEOUS
  Administered 2017-06-05: 4 [IU] via SUBCUTANEOUS
  Administered 2017-06-05: 3 [IU] via SUBCUTANEOUS
  Administered 2017-06-05: 7 [IU] via SUBCUTANEOUS
  Administered 2017-06-06 (×2): 4 [IU] via SUBCUTANEOUS
  Administered 2017-06-06: 11 [IU] via SUBCUTANEOUS
  Administered 2017-06-07: 4 [IU] via SUBCUTANEOUS
  Administered 2017-06-07: 3 [IU] via SUBCUTANEOUS
  Administered 2017-06-07 – 2017-06-08 (×4): 4 [IU] via SUBCUTANEOUS
  Administered 2017-06-09: 7 [IU] via SUBCUTANEOUS

## 2017-06-03 MED ORDER — ALBUTEROL SULFATE (2.5 MG/3ML) 0.083% IN NEBU: 2.5000 mg | INHALATION_SOLUTION | RESPIRATORY_TRACT | Status: DC | PRN

## 2017-06-03 MED ORDER — METHYLPREDNISOLONE SODIUM SUCC 40 MG IJ SOLR
20.0000 mg | Freq: Two times a day (BID) | INTRAMUSCULAR | Status: DC
  Administered 2017-06-04: 20 mg via INTRAVENOUS
  Filled 2017-06-03: qty 1

## 2017-06-03 NOTE — Progress Notes (Signed)
Inpatient Diabetes Program Recommendations  AACE/ADA: New Consensus Statement on Inpatient Glycemic Control (2015)  Target Ranges:  Prepandial:   less than 140 mg/dL      Peak postprandial:   less than 180 mg/dL (1-2 hours)      Critically ill patients:  140 - 180 mg/dL   Review of Glycemic Control  Diabetes history: DM 2 Outpatient Diabetes medications: Lantus 25 units QHS, Novolog Moderate + HS scale Current orders for Inpatient glycemic control: Lantus 25 units, Humalog 8 units tid, Metformin 500 mg Daily  Inpatient Diabetes Program Recommendations:     Glucose elevated due to steroids. Consider Novolog 5 units tid meal coverage in addition to Correction scale if patient consumes at least 50% of meals.  Thanks,  Tama Headings RN, MSN, [facility] Inpatient Diabetes Coordinator Team Pager [phone] (8a-5p)

## 2017-06-03 NOTE — Progress Notes (Signed)
CRITICAL VALUE ALERT  Critical Value:  Lactic Acid 3.2  Date & Time Notied:  06/03/17 1307  Provider Notified: Dr Cathlean Sauer  Orders Received/Actions taken: none

## 2017-06-03 NOTE — Progress Notes (Signed)
Spoke with CCM NP & was given orders not to remove trach sutures. Pt. Is followed by ENT at [facility].

## 2017-06-03 NOTE — Progress Notes (Signed)
PHARMACY - PHYSICIAN COMMUNICATION CRITICAL VALUE ALERT - BLOOD CULTURE IDENTIFICATION (BCID)  Results for orders placed or performed during the hospital encounter of 06/02/17  Blood Culture ID Panel (Reflexed) (Collected: 06/02/2017  1:32 PM)  Result Value Ref Range   Enterococcus species NOT DETECTED NOT DETECTED   Vancomycin resistance NOT DETECTED NOT DETECTED   Listeria monocytogenes NOT DETECTED NOT DETECTED   Staphylococcus species DETECTED (A) NOT DETECTED   Staphylococcus aureus NOT DETECTED NOT DETECTED   Methicillin resistance DETECTED (A) NOT DETECTED   Streptococcus species NOT DETECTED NOT DETECTED   Streptococcus agalactiae NOT DETECTED NOT DETECTED   Streptococcus pneumoniae NOT DETECTED NOT DETECTED   Streptococcus pyogenes NOT DETECTED NOT DETECTED   Acinetobacter baumannii NOT DETECTED NOT DETECTED   Enterobacteriaceae species NOT DETECTED NOT DETECTED   Enterobacter cloacae complex NOT DETECTED NOT DETECTED   Escherichia coli NOT DETECTED NOT DETECTED   Klebsiella oxytoca NOT DETECTED NOT DETECTED   Klebsiella pneumoniae NOT DETECTED NOT DETECTED   Proteus species NOT DETECTED NOT DETECTED   Serratia marcescens NOT DETECTED NOT DETECTED   Carbapenem resistance NOT DETECTED NOT DETECTED   Haemophilus influenzae NOT DETECTED NOT DETECTED   Neisseria meningitidis NOT DETECTED NOT DETECTED   Pseudomonas aeruginosa NOT DETECTED NOT DETECTED   Candida albicans NOT DETECTED NOT DETECTED   Candida glabrata NOT DETECTED NOT DETECTED   Candida krusei NOT DETECTED NOT DETECTED   Candida parapsilosis NOT DETECTED NOT DETECTED   Candida tropicalis NOT DETECTED NOT DETECTED    Name of physician (or Provider) Contacted: Dr. Cathlean Sauer via text page  Changes to prescribed antibiotics required: Probable contaminant, no antibiotics recommended  Norva Riffle 06/03/2017  11:13 AM

## 2017-06-03 NOTE — Progress Notes (Signed)
Pharmacy Antibiotic Note  [name] is a 46 y.o. female with concern for bacteremia (blood cultures with GPC). Pharmacy consulted to dose vancomycin  -WBC= 14.4, afebrile, SCr= 1.12, LA= 10.22 -vancomycin 2000mg  x1 given at 3pm on 9/11  Plan: -Vancomycin 750mg  IV q12h -Will follow renal function, cultures and clinical progress   Height: 5\' 1"  (154.9 cm) Weight: 231 lb 9.6 oz (105.1 kg) IBW/kg (Calculated) : 47.8  Temp (24hrs), Avg:98.5 F (36.9 C), Min:98 F (36.7 C), Max:99.2 F (37.3 C)   Recent Labs Lab 06/02/17 1330 06/02/17 1340 06/02/17 1942 06/03/17 0401  WBC 12.1*  --   --  14.4*  CREATININE 1.14*  --   --  1.12*  LATICACIDVEN  --  6.24* 10.22*  --     Estimated Creatinine Clearance: 69.3 mL/min (A) (by C-G formula based on SCr of 1.12 mg/dL (H)).    Allergies  Allergen Reactions  . Reglan [Metoclopramide] Other (See Comments)    Panic attack    Antimicrobials this admission: Vanc 9/11 x1 Cefepime 9/11x 1  Unasyn 9/11>> 912 Vanc 9/12>>   Dose adjustments this admission: None  Microbiology results: 9/11 BCx: GPC 1/2; BCID w/ staph w/ MEC (not staph aureus)  Thank you for allowing pharmacy to be a part of this patient's care.  Hildred Laser, Pharm D 06/03/2017 12:18 PM

## 2017-06-03 NOTE — Progress Notes (Signed)
PROGRESS NOTE    [name]  SUP:[id number] DOB: 24-Mar-1971 DOA: 06/02/2017 PCP: Arnoldo Morale, MD    Brief Narrative:  46 year old female who presented with dyspnea. Patient is known to have significant history for asthma, obstructive sleep apnea, hypertension, diabetes mellitus type 2, and morbid obesity. Patient had recent tracheostomy July 2017, due to respiratory failure, diagnosed with subglottic stenosis. She had several days of increased nasal drainage, and upper respiratory tract infection symptoms, evolving into dyspnea which was refractory to bronchodilator use at home. Initial physical examination, blood pressure 148/79, heart rate 110, respiratory 22 to 40, oxygen saturation 98 to 100% on supplemental oxygen. Moist mucous membranes, neck with tracheostomy in place, lungs had decreased breath sounds bilaterally and coarse expiratory wheezing, heart S1-S2 present rhythmic, abdomen was protuberant, soft nontender, no lower extremity edema. Sodium 138, potassium 3.1, chloride 105, bicarbonate 18, glucose 220, BUN 11, creatinine 1.14, white count 12.1, hemoglobin 11.0, hematocrit 33.2, platelets 424, venous lactic acid 6.2, urinalysis negative for infection, EKG with sinus tachycardia, normal intervals, normal axis. Chest film, not rotated, well penetrated, hypo-inflated, no effusions, infiltrates or pneumothorax. CT chest negative for pulmonary embolus, no infiltrates.  Patient admitted to the hospital with working diagnosis of acute hypoxic respiratory failure due to asthma exacerbation.  Assessment & Plan:   Principal Problem:   Acute respiratory distress Active Problems:   Sleep apnea, obstructive   Essential hypertension   Type 2 diabetes mellitus with diabetic polyneuropathy, without long-term current use of insulin (HCC)   Morbid obesity (HCC)   Chronic pain syndrome   Severe asthma with acute exacerbation   Generalized anxiety disorder   Tracheostomy status  (HCC)   Hypokalemia   Elevated lactic acid level   1. Asthma exacerbation with acute hypoxemic respiratory failure. Will continue systemic steroids, at 40 mg q 8 hours, aggressive bronchodilator therapy, supplemental 02 per trach collar. Trach care per protocol. Out of bed as tolerated. Chest film personally reviewed noted on infiltrates. Continue budesonide. No signs of infection, will dc antibiotic therapy, blood culture with positive staph, likely contamination, will repeat cultures x2.  2. T2DM. Will continue insulin sliding scale for glucose cover and monitoring, patient tolerating po well. Basal insulin with glargine 20 units. Uncontrolled capillary glucose 295, 359.   3. HTN. Will continue blood pressure control with amlodipine, carvedilol. .   4. AKI with hyperkalemia. Renal function with serum cr at 1.12, K at 5,0, will follow on renal panel in am. Hold further, potassium supplements.   5. Anemia. Hb and hct stable, no signs of bleeding, continue to follow up on cell count.   6. Depression. Continue sertraline and topiramate  DVT prophylaxis: enoxaparin  Code Status: full Family Communication: I spoke with patient's family at the bedside and all questions were addressed Disposition Plan: Home    Consultants:   Pulmonary  Procedures:     Antimicrobials:       Subjective: Patient reports improved dyspnea, but still present, moderate in intensity, associated with cough and wheezing, no chest pain, improved with nebulizer treatments, worse with exertion. Has tracheostomy since 03/2017. At home has been functional, ambulating with no assistance.    Objective: Vitals:   06/03/17 0416 06/03/17 0549 06/03/17 0754 06/03/17 0756  BP:  (!) 150/87  (!) 151/82  Pulse: 78 78  61  Resp: _0 Temp:  98 F (36.7 C)    TempSrc:  Oral    SpO2: 100% 98% 100% 100%  Weight:  105.1  kg (231 lb 9.6 oz)    Height:        Intake/Output Summary (Last 24 hours) at 06/03/17  0819 Last data filed at 06/03/17 0700  Gross per 24 hour  Intake           951.25 ml  Output             1150 ml  Net          -198.75 ml   Filed Weights   06/02/17 1257 06/02/17 2038 06/03/17 0549  Weight: 103 kg (227 lb) 105.1 kg (231 lb 9.6 oz) 105.1 kg (231 lb 9.6 oz)    Examination:  General: deconditioned. Neurology: Awake and alert, non focal  E ENT: no pallor, no icterus, oral mucosa moist Cardiovascular: S1-S2 present, rhythmic, no gallops, rubs, or murmurs. No jugular venous distention, no lower extremity edema. Pulmonary: decreased breath sounds bilaterally, decreased air movement, scattered wheezing, no rhonchi or rales. No accessory muscle use, tracheostomy in place.  Gastrointestinal. Abdomen flat, no organomegaly, non tender, no rebound or guarding Skin. No rashes Musculoskeletal: no joint deformities     Data Reviewed: I have personally reviewed following labs and imaging studies  CBC:  Recent Labs Lab 06/02/17 1330 06/03/17 0401  WBC 12.1* 14.4*  NEUTROABS 5.2  --   HGB 11.0* 9.5*  HCT 33.2* 30.0*  MCV 83.4 83.8  PLT 424* 983   Basic Metabolic Panel:  Recent Labs Lab 06/02/17 1330 06/03/17 0401  NA 138 136  K 3.1* 5.0  CL 105 109  CO2 18* 19*  GLUCOSE 220* 324*  BUN 11 11  CREATININE 1.14* 1.12*  CALCIUM 9.4 8.9   GFR: Estimated Creatinine Clearance: 69.3 mL/min (A) (by C-G formula based on SCr of 1.12 mg/dL (H)). Liver Function Tests:  Recent Labs Lab 06/02/17 1330  AST 21  ALT 16  ALKPHOS 100  BILITOT 0.4  PROT 7.7  ALBUMIN 3.7   No results for input(s): LIPASE, AMYLASE in the last 168 hours. No results for input(s): AMMONIA in the last 168 hours. Coagulation Profile: No results for input(s): INR, PROTIME in the last 168 hours. Cardiac Enzymes: No results for input(s): CKTOTAL, CKMB, CKMBINDEX, TROPONINI in the last 168 hours. BNP (last 3 results) No results for input(s): PROBNP in the last 8760 hours. HbA1C:  Recent  Labs  06/02/17 1933  HGBA1C 8.3*   CBG:  Recent Labs Lab 06/02/17 2035 06/03/17 0802  GLUCAP 386* 295*   Lipid Profile: No results for input(s): CHOL, HDL, LDLCALC, TRIG, CHOLHDL, LDLDIRECT in the last 72 hours. Thyroid Function Tests: No results for input(s): TSH, T4TOTAL, FREET4, T3FREE, THYROIDAB in the last 72 hours. Anemia Panel: No results for input(s): VITAMINB12, FOLATE, FERRITIN, TIBC, IRON, RETICCTPCT in the last 72 hours.    Radiology Studies: I have reviewed all of the imaging during this hospital visit personally     Scheduled Meds: . albuterol  2.5 mg Nebulization TID  . amLODipine  10 mg Oral Daily  . artificial tears  1 application Both Eyes QHS  . atorvastatin  20 mg Oral Daily  . budesonide (PULMICORT) nebulizer solution  0.5 mg Nebulization BID  . carvedilol  6.25 mg Oral BID WC  . chlorhexidine gluconate (MEDLINE KIT)  15 mL Mouth Rinse BID  . enoxaparin (LOVENOX) injection  40 mg Subcutaneous Q24H  . fluticasone  2 spray Each Nare BID  . gabapentin  300 mg Oral TID  . insulin aspart  0-15 Units Subcutaneous TID WC  .  insulin aspart  0-5 Units Subcutaneous QHS  . insulin glargine  25 Units Subcutaneous QHS  . ipratropium  2 spray Each Nare TID  . methylPREDNISolone (SOLU-MEDROL) injection  60 mg Intravenous Q6H  . oxyCODONE-acetaminophen  2 tablet Oral Once  . pantoprazole  40 mg Oral Daily  . sertraline  100 mg Per Tube Daily  . sodium chloride HYPERTONIC  4 mL Nebulization BID  . topiramate  100 mg Oral BID   Continuous Infusions: . sodium chloride 75 mL/hr at 06/02/17 1859  . ampicillin-sulbactam (UNASYN) IV Stopped (06/03/17 9106)     LOS: 1 day        Mauricio Gerome Apley, MD [facility] Pager [phone]

## 2017-06-03 NOTE — Consult Note (Signed)
PULMONARY / CRITICAL CARE MEDICINE   Name: [name] MRN: [id number] DOB: 10-Nov-1970    ADMISSION DATE:  06/02/2017 CONSULTATION DATE:  9/11  REFERRING MD:  isaascs  CHIEF COMPLAINT:  Respiratory distress   HISTORY OF PRESENT ILLNESS:   This is a 46 year old aaf w/ known h/o asthma and tracheostomy dependence d/t subglottal stenosis. Her subglottal stenosis is being managed at [facility] and they are hopefully working towards decannulation at some point. She presents to the ER on 9/11 w/ several day h/o increased nasal d/c, more after meals, and progressive cough which has been productive of clear sputum and sometimes rarely blood tinged. She had fever on 9/10. As cough progressed she had associated progressive shortness of breath & some chest discomfort. This did not improve w/ rescue BDs so she came to the ER  ->in er CXR clear ->lactic acid elevated, got IVFs, abx and PCCM asked to eval  SUBJECTIVE:  Feels better.  No SOB or respiratory distress. Normo to hypertensive.  O2 stable on 28% TC.  Good appetite.  Afebrile  VITAL SIGNS: BP (!) 151/82   Pulse 61   Temp 98.2 F (36.8 C) (Oral)   Resp 12   Ht 5\' 1"  (1.549 m)   Wt 231 lb 9.6 oz (105.1 kg)   SpO2 100%   BMI 43.76 kg/m   HEMODYNAMICS:    VENTILATOR SETTINGS: FiO2 (%):  [28 %-60 %] 28 %  INTAKE / OUTPUT: I/O last 3 completed shifts: In: 951.3 [I.V.:751.3; IV Piggyback:200] Out: 1150 [Urine:1150]  PHYSICAL EXAMINATION: General:  Adult female sitting up in bed in NAD HEENT: MM pink/moist, sutured trach- no secretions Neuro: AO, MAE, mouths words CV:  rrr, no m/r/g PULM: even/non-labored, lungs bilaterally clear, no wheeze GI: soft, non-tender, bsx4 active  Extremities: warm/dry, no edema  Skin: no rashes or lesions  LABS:  BMET  Recent Labs Lab 06/02/17 1330 06/03/17 0401  NA 138 136  K 3.1* 5.0  CL 105 109  CO2 18* 19*  BUN 11 11  CREATININE 1.14* 1.12*  GLUCOSE 220* 324*     Electrolytes  Recent Labs Lab 06/02/17 1330 06/03/17 0401  CALCIUM 9.4 8.9    CBC  Recent Labs Lab 06/02/17 1330 06/03/17 0401  WBC 12.1* 14.4*  HGB 11.0* 9.5*  HCT 33.2* 30.0*  PLT 424* 381    Coag's No results for input(s): APTT, INR in the last 168 hours.  Sepsis Markers  Recent Labs Lab 06/02/17 1340 06/02/17 1933 06/02/17 1942 06/03/17 0401  LATICACIDVEN 6.24*  --  10.22*  --   PROCALCITON  --  <0.10  --  <0.10    ABG No results for input(s): PHART, PCO2ART, PO2ART in the last 168 hours.  Liver Enzymes  Recent Labs Lab 06/02/17 1330  AST 21  ALT 16  ALKPHOS 100  BILITOT 0.4  ALBUMIN 3.7    Cardiac Enzymes No results for input(s): TROPONINI, PROBNP in the last 168 hours.  Glucose  Recent Labs Lab 06/02/17 2035 06/03/17 0802  GLUCAP 386* 295*    Imaging Ct Angio Chest Pe W Or Wo Contrast  Result Date: 06/02/2017 CLINICAL DATA:  Short of breath chest tightness.  Suspect PE EXAM: CT ANGIOGRAPHY CHEST WITH CONTRAST TECHNIQUE: Multidetector CT imaging of the chest was performed using the standard protocol during bolus administration of intravenous contrast. Multiplanar CT image reconstructions and MIPs were obtained to evaluate the vascular anatomy. CONTRAST:  80 mL Isovue 370 IV COMPARISON:  Chest x-ray 06/02/2017.  Chest CT 05/06/2017 FINDINGS: Image quality degraded by patient motion and large patient size. Cardiovascular: Negative for pulmonary embolism. Negative for aortic aneurysm or dissection mild cardiac enlargement. Mediastinum/Nodes: Right lower peritracheal lymph node 19 mm unchanged from the prior study. Otherwise no adenopathy. Tracheostomy tube in place. Lungs/Pleura: Mild atelectasis in the lung bases. Negative for pneumonia or effusion Upper Abdomen: Fatty infiltration of the liver. Musculoskeletal: No acute skeletal abnormality Review of the MIP images confirms the above findings. IMPRESSION: Negative for pulmonary embolism.  Mild bibasilar atelectasis 19 mm right lower peritracheal lymph node unchanged from the recent study. Electronically Signed   By: Franchot Gallo M.D.   On: 06/02/2017 19:01   Dg Chest Port 1 View  Result Date: 06/02/2017 CLINICAL DATA:  Severe shortness of breath. EXAM: PORTABLE CHEST 1 VIEW COMPARISON:  Chest x-ray dated May 06, 2017. FINDINGS: Unchanged tracheostomy tube with the tip at the thoracic inlet. The cardiomediastinal silhouette is at the upper limits of normal in size. Low lung volumes are present, causing crowding of the pulmonary vasculature. No focal consolidation, pleural effusion, or pneumothorax. No acute osseous abnormality. IMPRESSION: No active disease. Electronically Signed   By: Titus Dubin M.D.   On: 06/02/2017 13:28   STUDIES:  CT chest 9/11>>> neg for PE, mild bibasilar atelectasis, 19 mm right lower peritracheal lymph node unchanged from recent study  CULTURES: Sputum 9/11>>> [facility] 9/11 >> 1/2 MRSE  ANTIBIOTICS: vanc 9/11>>> Cefepime 9/11 >> x 1 dose  SIGNIFICANT EVENTS: 9/11 Admit  LINES/TUBES: 7/18 trach (at [facility]) >>  ASSESSMENT / PLAN:  Acute on chronic respiratory failure Asthmatic excerebration Non-allergic/gustatory rhinitis Tracheostomy dependence d/t subglottal  stenosis  GERD Possible URI SIRS/sepsis (NOT SHOCK) Lactic acidosis  Diabetes   Acute on chronic respiratory failure in setting of asthmatic exacerbation Seemingly exacerbated by increased nasal drainage. -likely triggers: non-allergic/gustatory rhinitis, probable reflux and possible URI.  -cxr does not show PNA, PCT negative  Plan/rec Continue TC 28%   Continue Pulse ox monitoring continue BDs Continue steroids  PPI bid Continue nasal Atrovent prior to meals (helps  w/gustatory rhinitis) BID flonase (in case allergic component) CT chest neg for PE   SIRS/sepsis w/ lactic acidosis.  MRSE (one of two) This is not septic shock given she is normotensive.  - non-toxic  on exam Plan Follow BC Continue ID/ vancomycin Repeat Lactic now, LDH Patient refused ABG Follow sputum culture TTE to evaluate for vegetation  Tracheostomy dependence d/t subglottal stenosis.  -followed at [facility] by ENT. Working towards possible decannulation at some point.  Plan Cont routine trach care Do not remove trach sutures  Will need to f/u at [facility] for recs for suture removal and follow up care  Mild AKI Plan:  Cont IVFs Trend BMET  DM Plan ssi   Mild anemia  Plan [place] heparin  Trend cbg  [name], AGACNP-BC [place] Pgr: [phone] or if no answer [phone] 06/03/2017, 11:08 AM   STAFF NOTE: I, Merrie Roof, MD FACP have personally reviewed patient's available data, including medical history, events of note, physical examination and test results as part of my evaluation. I have discussed with resident/NP and other care providers such as pharmacist, RN and RRT. In addition, I personally evaluated patient and elicited key findings of: awake, alert on TC, no distress, trach clean, abdo soft, no r/g, mild pain to deep palpation that she reports is chronic, her lactic rise concerned, her pcxr and CT I reviewed does NOT show true infiltrate,she  has known trach stenosis and her lactic could be explained by the rsp failure associated with this, plan is to repeat lactic now, assess ldh, if elevated will need ct abdo/pelvis , MRSE likely is contamination but with lactic need vanc to continued, will need repeat BC, if she has continue rise and fall of lactic may need bronch assessment for airway obstruction as cause, I updated the pt in full  Lavon Paganini. Titus Mould, MD, [place] Pgr: [facility] 06/03/2017 3:32 PM

## 2017-06-03 NOTE — Progress Notes (Signed)
Attempted ABG x 1. Pt. Tensed arm up and began moving her feet and legs telling RT to stop after a small flash was obtained. Will notify CCM that pt. Is refusing to be stuck again for an ABG.

## 2017-06-04 ENCOUNTER — Inpatient Hospital Stay (HOSPITAL_COMMUNITY): Payer: Medicare Other

## 2017-06-04 DIAGNOSIS — F411 Generalized anxiety disorder: Secondary | ICD-10-CM

## 2017-06-04 DIAGNOSIS — G894 Chronic pain syndrome: Secondary | ICD-10-CM

## 2017-06-04 DIAGNOSIS — I5030 Unspecified diastolic (congestive) heart failure: Secondary | ICD-10-CM

## 2017-06-04 LAB — BASIC METABOLIC PANEL
ANION GAP: 6 (ref 5–15)
BUN: 22 mg/dL — ABNORMAL HIGH (ref 6–20)
CO2: 23 mmol/L (ref 22–32)
Calcium: 9 mg/dL (ref 8.9–10.3)
Chloride: 106 mmol/L (ref 101–111)
Creatinine, Ser: 1.24 mg/dL — ABNORMAL HIGH (ref 0.44–1.00)
GFR, EST AFRICAN AMERICAN: 59 mL/min — AB (ref >60)
GFR, EST NON AFRICAN AMERICAN: 51 mL/min — AB (ref >60)
Glucose, Bld: 322 mg/dL — ABNORMAL HIGH (ref 65–99)
POTASSIUM: 4.6 mmol/L (ref 3.5–5.1)
SODIUM: 135 mmol/L (ref 135–145)

## 2017-06-04 LAB — CBC WITH DIFFERENTIAL/PLATELET
Basophils Absolute: 0 10*3/uL (ref 0.0–0.1)
Basophils Relative: 0 %
EOS ABS: 0 10*3/uL (ref 0.0–0.7)
EOS PCT: 0 %
HCT: 29.7 % — ABNORMAL LOW (ref 36.0–46.0)
Hemoglobin: 9.2 g/dL — ABNORMAL LOW (ref 12.0–15.0)
LYMPHS ABS: 2 10*3/uL (ref 0.7–4.0)
Lymphocytes Relative: 11 %
MCH: 26.5 pg (ref 26.0–34.0)
MCHC: 31 g/dL (ref 30.0–36.0)
MCV: 85.6 fL (ref 78.0–100.0)
MONOS PCT: 5 %
Monocytes Absolute: 0.9 10*3/uL (ref 0.1–1.0)
Neutro Abs: 16 10*3/uL — ABNORMAL HIGH (ref 1.7–7.7)
Neutrophils Relative %: 84 %
PLATELETS: 356 10*3/uL (ref 150–400)
RBC: 3.47 MIL/uL — ABNORMAL LOW (ref 3.87–5.11)
RDW: 15.2 % (ref 11.5–15.5)
WBC: 18.9 10*3/uL — ABNORMAL HIGH (ref 4.0–10.5)

## 2017-06-04 LAB — ECHOCARDIOGRAM COMPLETE
HEIGHTINCHES: 61 in
Weight: 3708.8 oz

## 2017-06-04 LAB — CULTURE, BLOOD (ROUTINE X 2): SPECIAL REQUESTS: ADEQUATE

## 2017-06-04 LAB — GLUCOSE, CAPILLARY
GLUCOSE-CAPILLARY: 237 mg/dL — AB (ref 65–99)
GLUCOSE-CAPILLARY: 313 mg/dL — AB (ref 65–99)
Glucose-Capillary: 258 mg/dL — ABNORMAL HIGH (ref 65–99)
Glucose-Capillary: 384 mg/dL — ABNORMAL HIGH (ref 65–99)

## 2017-06-04 LAB — PROCALCITONIN

## 2017-06-04 MED ORDER — INSULIN GLARGINE 100 UNIT/ML ~~LOC~~ SOLN: 20.0000 [IU] | Freq: Every day | SUBCUTANEOUS | Status: DC

## 2017-06-04 MED ORDER — METHYLPREDNISOLONE SODIUM SUCC 40 MG IJ SOLR: 20.0000 mg | Freq: Every day | INTRAMUSCULAR | Status: DC

## 2017-06-04 MED ORDER — CHLORHEXIDINE GLUCONATE 0.12 % MT SOLN
15.0000 mL | Freq: Two times a day (BID) | OROMUCOSAL | Status: DC
  Administered 2017-06-04 – 2017-06-08 (×4): 15 mL via OROMUCOSAL
  Filled 2017-06-04 (×8): qty 15

## 2017-06-04 MED ORDER — ORAL CARE MOUTH RINSE
15.0000 mL | Freq: Two times a day (BID) | OROMUCOSAL | Status: DC
  Administered 2017-06-05 – 2017-06-08 (×7): 15 mL via OROMUCOSAL

## 2017-06-04 NOTE — Consult Note (Signed)
   [facility] CM Inpatient Consult   06/04/2017  [name] 05-Dec-1970 [id number]  Patient evaluated for community based chronic disease management services with [facility] as a benefit of patient's Medicare ACO Insurance. Patient was admitted with acute on chronic respiratory failure , tracheostomy dependent d/t subglottal stenosis, with Asthmatic exacerbation per MD notes. Met with patient and her husband, Juanda Crumble  at bedside to explain [facility] services. Patient deferred all communication from her husband as she would only able to whisper.  Consent form obtained and signed.  Patient will receive post hospital discharge call and will be evaluated for monthly home visits for assessments and disease process education.  Left contact information and [facility] literature at bedside. Made Inpatient Case Manager aware that [facility] Management following. Of note, [facility] Care Management services does not replace or interfere with any services that are arranged by inpatient case management or social work.  For additional questions or referrals please contact:    Natividad Brood, RN BSN [facility] Liaison  [phone] business mobile phone Toll free office [phone]

## 2017-06-04 NOTE — Evaluation (Signed)
Physical Therapy Evaluation Patient Details Name: [name] MRN: [id number] DOB: Mar 19, 1971 Today's Date: 06/04/2017   History of Present Illness  This is a 46 year old aaf w/ known h/o asthma and tracheostomy dependence d/t subglottal stenosis.  Admitted due to SOB, cough and nasal discharge with suspected URI SIRS/sepsis.  Clinical Impression  Patient presents with decreased mobility due to general weakness, decreased balance and decreased activity tolerance.  She will benefit from skilled PT in the acute setting to allow return home with family support.  No current follow up PT needs.     Follow Up Recommendations No PT follow up    Equipment Recommendations  Other (comment) (will trial cane)    Recommendations for Other Services       Precautions / Restrictions Precautions Precautions: Fall      Mobility  Bed Mobility Overal bed mobility: Needs Assistance Bed Mobility: Supine to Sit;Sit to Supine     Supine to sit: Supervision;HOB elevated Sit to supine: Supervision   General bed mobility comments: due to lines  Transfers Overall transfer level: Needs assistance   Transfers: Sit to/from Stand Sit to Stand: Min guard         General transfer comment: for balance  Ambulation/Gait Ambulation/Gait assistance: Min assist;Min guard Ambulation Distance (Feet): 300 Feet Assistive device: 1 person hand held assist Gait Pattern/deviations: Step-through pattern;Decreased stride length;Antalgic;Trendelenburg     General Gait Details: demonstrates R hip weakness with ambulation more initially, but improved with distance  Stairs            Wheelchair Mobility    Modified Rankin (Stroke Patients Only)       Balance Overall balance assessment: Needs assistance   Sitting balance-[name]: Good       Standing balance-[name]: Good Standing balance comment: but holds to foot board standing while I straighted her bed                              Pertinent Vitals/Pain Pain Assessment: 0-10 Pain Score: 8  Pain Location: chest & neck (from coughing) Pain Descriptors / Indicators: Discomfort;Grimacing;Sore Pain Intervention(s): Monitored during session;Repositioned;Patient requesting pain meds-RN notified    Home Living Family/patient expects to be discharged to:: Private residence Living Arrangements: Spouse/significant other Available Help at Discharge: Family;Available PRN/intermittently Type of Home: Mobile home Home Access: Stairs to enter Entrance Stairs-Rails: Right Entrance Stairs-Number of Steps: 5 Home Layout: One level Home Equipment: Grab bars - tub/shower;Hand held shower head;Shower seat - built in      Prior Function Level of Independence: Independent               Journalist, newspaper        Extremity/Trunk Assessment   Upper Extremity Assessment Upper Extremity Assessment: Overall WFL for tasks assessed    Lower Extremity Assessment Lower Extremity Assessment: Overall WFL for tasks assessed       Communication   Communication: Tracheostomy (whispers or mouths due to trach)  Cognition Arousal/Alertness: Awake/alert Behavior During Therapy: WFL for tasks assessed/performed Overall Cognitive Status: Within Functional Limits for tasks assessed                                        General Comments General comments (skin integrity, edema, etc.): spouse present in room, SpO2 99% and HR 96 on 35% TC with ambulation  Exercises     Assessment/Plan    PT Assessment Patient needs continued PT services  PT Problem List Decreased mobility;Decreased activity tolerance;Decreased balance       PT Treatment Interventions Gait training;Therapeutic activities;Therapeutic exercise;Patient/family education;Stair training;Functional mobility training;Balance training    PT Goals (Current goals can be found in the Care Plan section)  Acute Rehab PT  Goals Patient Stated Goal: To return to independent PT Goal Formulation: With patient Time For Goal Achievement: 06/11/17    Frequency Min 3X/week   Barriers to discharge        Co-evaluation               AM-PAC PT "6 Clicks" Daily Activity  Outcome Measure Difficulty turning over in bed (including adjusting bedclothes, sheets and blankets)?: None Difficulty moving from lying on back to sitting on the side of the bed? : A Little Difficulty sitting down on and standing up from a chair with arms (e.g., wheelchair, bedside commode, etc,.)?: Unable Help needed moving to and from a bed to chair (including a wheelchair)?: A Little Help needed walking in hospital room?: A Little Help needed climbing 3-5 steps with a railing? : A Little 6 Click Score: 17    End of Session Equipment Utilized During Treatment: Oxygen Activity Tolerance: Patient tolerated treatment well Patient left: in bed;with call bell/phone within reach;with family/visitor present Nurse Communication: Other (comment);Patient requests pain meds (requests suctioning) PT Visit Diagnosis: Other abnormalities of gait and mobility (R26.89)    Time: 0017-4944 PT Time Calculation (min) (ACUTE ONLY): 25 min   Charges:   PT Evaluation $PT Eval Moderate Complexity: 1 Mod PT Treatments $Gait Training: 8-22 mins   PT G CodesMagda Kiel, [place] [phone] 06/04/2017   Reginia Naas 06/04/2017, 12:39 PM

## 2017-06-04 NOTE — Progress Notes (Signed)
  Echocardiogram 2D Echocardiogram has been performed.  Jennette Dubin 06/04/2017, 11:03 AM

## 2017-06-04 NOTE — Progress Notes (Signed)
Inpatient Diabetes Program Recommendations  AACE/ADA: New Consensus Statement on Inpatient Glycemic Control (2015)  Target Ranges:  Prepandial:   less than 140 mg/dL      Peak postprandial:   less than 180 mg/dL (1-2 hours)      Critically ill patients:  140 - 180 mg/dL   Lab Results  Component Value Date   GLUCAP 384 (H) 06/04/2017   HGBA1C 8.3 (H) 06/02/2017    Review of Glycemic Control  Blood sugars > 180 mg/dL. Needs insulin adjustment.  Inpatient Diabetes Program Recommendations:    Increase Lantus to 30 units QHS Add Novolog 5 units tidwc for meal coverage insulin.  Needs medication adjustment for insulin at discharge.  Thank you. Lorenda Peck, RD, LDN, CDE Inpatient Diabetes Coordinator [phone]

## 2017-06-04 NOTE — Progress Notes (Signed)
PROGRESS NOTE    Mckenzye Cutright [name]  JJK:[id number] DOB: 01-Nov-1970 DOA: 06/02/2017 PCP: Arnoldo Morale, MD    Brief Narrative:  46 year old female who presented with dyspnea. Patient is known to have significant history for asthma, obstructive sleep apnea, hypertension, diabetes mellitus type 2, and morbid obesity. Patient had recent tracheostomy July 2017, due to respiratory failure, diagnosed with subglottic stenosis. She had several days of increased nasal drainage, and upper respiratory tract infection symptoms, evolving into dyspnea which was refractory to bronchodilator use at home. Initial physical examination, blood pressure 148/79, heart rate 110, respiratory 22 to 40, oxygen saturation 98 to 100% on supplemental oxygen. Moist mucous membranes, neck with tracheostomy in place, lungs had decreased breath sounds bilaterally and coarse expiratory wheezing, heart S1-S2 present rhythmic, abdomen was protuberant, soft nontender, no lower extremity edema. Sodium 138, potassium 3.1, chloride 105, bicarbonate 18, glucose 220, BUN 11, creatinine 1.14, white count 12.1, hemoglobin 11.0, hematocrit 33.2, platelets 424, venous lactic acid 6.2, urinalysis negative for infection, EKG with sinus tachycardia, normal intervals, normal axis. Chest film, not rotated, well penetrated, hypo-inflated, no effusions, infiltrates or pneumothorax. CT chest negative for pulmonary embolus, no infiltrates.  Patient admitted to the hospital with working diagnosis of acute hypoxic respiratory failure due to asthma exacerbation   Assessment & Plan:   Principal Problem:   Acute respiratory distress Active Problems:   Sleep apnea, obstructive   Essential hypertension   Type 2 diabetes mellitus with diabetic polyneuropathy, without long-term current use of insulin (HCC)   Morbid obesity (HCC)   Chronic pain syndrome   Severe asthma with acute exacerbation   Generalized anxiety disorder   Tracheostomy  status (HCC)   Hypokalemia   Elevated lactic acid level   1. Asthma exacerbation with acute hypoxemic respiratory failure. No significant wheezing, will continue to decrease systemic steroids, to prevent worsening hyperglycemia, will continue with aggressive bronchodilator therapy, supplemental 02 per trach collar. Continue to hold on antibiotic therapy. Out of bed and ambulation. Reactive leukocytosis due to steroids, will follow on cell count in am, on lower dose of methylprednisolone.   2. T2DM. Uncontrolled hyperglycemia. Increased, insulin sliding scale to resistant, continue with basal insulin, glargine 25 units at night. Capillary glucose  379, 348, 237, 258. Patient tolerating po well.  3. HTN. On amlodipine, and carvedilol, for blood pressure control.    4. AKI with hyperkalemia. Renal function with serum cr stable at 1.12, and 1,2. K down to 4,6, will continue to follow on renal panel in am, continue to hold on IV fluids, patient tolerating po well.  5. Anemia. Stable hb and hct, 9.2 and 29.   6. Depression. On sertraline and topiramate  DVT prophylaxis: enoxaparin  Code Status: full Family Communication: I spoke with patient's family at the bedside and all questions were addressed Disposition Plan: Home    Consultants:   Pulmonary  Procedures:     Antimicrobials:      Subjective: Patient with slow improvement in persistent dyspnea, worse with exertion, improved with nebs, associated persistent secretions per trach, but no chest pain. No cough, no nausea or vomiting, has not ambulated yet in the hallway.   Objective: Vitals:   06/03/17 2010 06/04/17 0514 06/04/17 0735 06/04/17 0746  BP: (!) 143/89 137/78 (!) 142/70   Pulse: 78 69 61 66  Resp: 16 10 15 16   Temp: 98.1 F (36.7 C) 98.1 F (36.7 C) 98.2 F (36.8 C)   TempSrc: Oral Oral Oral   SpO2: 100%  100%  100%  Weight:  105.1 kg (231 lb 12.8 oz)    Height:        Intake/Output Summary (Last 24  hours) at 06/04/17 0801 Last data filed at 06/03/17 2100  Gross per 24 hour  Intake              530 ml  Output              950 ml  Net             -420 ml   Filed Weights   06/02/17 2038 06/03/17 0549 06/04/17 0514  Weight: 105.1 kg (231 lb 9.6 oz) 105.1 kg (231 lb 9.6 oz) 105.1 kg (231 lb 12.8 oz)    Examination:  General: deconditioned.  Neurology: Awake and alert, non focal  E ENT: no pallor, no icterus, oral mucosa moist Cardiovascular: No JVD, S1-S2 present, rhythmic, no gallops, rubs, or murmurs. No jugular venous distention, non pitting edema lower extremity edema. Pulmonary: decreased breath sounds bilaterally, adequate air movement at the apical zones, no wheezing, rhonchi or rales. Gastrointestinal. Abdomen flat, no organomegaly, non tender, no rebound or guarding Skin. No rashes Musculoskeletal: no joint deformities     Data Reviewed: I have personally reviewed following labs and imaging studies  CBC:  Recent Labs Lab 06/02/17 1330 06/03/17 0401 06/04/17 0227  WBC 12.1* 14.4* 18.9*  NEUTROABS 5.2  --  16.0*  HGB 11.0* 9.5* 9.2*  HCT 33.2* 30.0* 29.7*  MCV 83.4 83.8 85.6  PLT 424* 381 078   Basic Metabolic Panel:  Recent Labs Lab 06/02/17 1330 06/03/17 0401 06/04/17 0227  NA 138 136 135  K 3.1* 5.0 4.6  CL 105 109 106  CO2 18* 19* 23  GLUCOSE 220* 324* 322*  BUN 11 11 22*  CREATININE 1.14* 1.12* 1.24*  CALCIUM 9.4 8.9 9.0   GFR: Estimated Creatinine Clearance: 62.6 mL/min (A) (by C-G formula based on SCr of 1.24 mg/dL (H)). Liver Function Tests:  Recent Labs Lab 06/02/17 1330  AST 21  ALT 16  ALKPHOS 100  BILITOT 0.4  PROT 7.7  ALBUMIN 3.7   No results for input(s): LIPASE, AMYLASE in the last 168 hours. No results for input(s): AMMONIA in the last 168 hours. Coagulation Profile: No results for input(s): INR, PROTIME in the last 168 hours. Cardiac Enzymes: No results for input(s): CKTOTAL, CKMB, CKMBINDEX, TROPONINI in the last  168 hours. BNP (last 3 results) No results for input(s): PROBNP in the last 8760 hours. HbA1C:  Recent Labs  06/02/17 1933  HGBA1C 8.3*   CBG:  Recent Labs Lab 06/03/17 0802 06/03/17 1148 06/03/17 1612 06/03/17 2014 06/04/17 0733  GLUCAP 295* 359* 379* 348* 237*   Lipid Profile: No results for input(s): CHOL, HDL, LDLCALC, TRIG, CHOLHDL, LDLDIRECT in the last 72 hours. Thyroid Function Tests: No results for input(s): TSH, T4TOTAL, FREET4, T3FREE, THYROIDAB in the last 72 hours. Anemia Panel: No results for input(s): VITAMINB12, FOLATE, FERRITIN, TIBC, IRON, RETICCTPCT in the last 72 hours.    Radiology Studies: I have reviewed all of the imaging during this hospital visit personally     Scheduled Meds: . amLODipine  10 mg Oral Daily  . artificial tears  1 application Both Eyes QHS  . atorvastatin  20 mg Oral Daily  . budesonide (PULMICORT) nebulizer solution  0.5 mg Nebulization BID  . carvedilol  6.25 mg Oral BID WC  . chlorhexidine  15 mL Mouth Rinse BID  . chlorhexidine gluconate (MEDLINE KIT)  15 mL Mouth Rinse BID  . enoxaparin (LOVENOX) injection  40 mg Subcutaneous Q24H  . fluticasone  2 spray Each Nare BID  . gabapentin  300 mg Oral TID  . insulin aspart  0-20 Units Subcutaneous TID WC  . insulin aspart  0-5 Units Subcutaneous QHS  . insulin glargine  25 Units Subcutaneous QHS  . ipratropium  2 spray Each Nare TID  . ipratropium-albuterol  3 mL Nebulization BID  . mouth rinse  15 mL Mouth Rinse q12n4p  . methylPREDNISolone (SOLU-MEDROL) injection  20 mg Intravenous Q12H  . oxyCODONE-acetaminophen  2 tablet Oral Once  . pantoprazole  40 mg Oral Daily  . sertraline  100 mg Per Tube Daily  . topiramate  100 mg Oral BID   Continuous Infusions:   LOS: 2 days        Averi Kilty Gerome Apley, MD [facility] Pager [phone]

## 2017-06-04 NOTE — Care Management Note (Addendum)
Case Management Note  Patient Details  Name: [name] MRN: [id number] Date of Birth: 30-May-1971  Subjective/Objective:  Pt presented for Respiratory Failure- Asthma Exacerbation.Pt has trach and she is at home with [facility]. PT has PCP at the [facility] ([facility]Original Appointment 06-05-17 appointment was cancelled via an Automated  System that called to the patient due to being hospitalized. Opal Sidles with [facility] will call the patient on Monday to reschedule the appointment. Pt is currently active with [facility] for RN, PT/OT & SW.                  Action/Plan: Pt will need resumption orders for [place] prior to d/c. Liaison Mary with [facility] is aware that pt is hospitalized. CM did contact [place] to see if [name] supplies are via the company and they care. [facility] is checking to see if patient has 02 via the agency.   Expected Discharge Date:                  Expected Discharge Plan:  [place], Resumption of Services.   In-House Referral:  NA  Discharge planning Services  CM Consult  Post Acute Care Choice:  Home Health, Resumption of Svcs/PTA Provider Choice offered to:  Patient  DME Arranged:   Oxygen, Trach Supplies- Previously arranged.  DME Agency:   [facility]  HH Arranged:  RN, PT, Social Work, OT [place] Agency:  [employer] (now [facility])  Status of Service:   In Process.   If discussed at [place] of Stay Meetings, dates discussed:    Additional Comments:  Bethena Roys, RN 06/04/2017, 3:11 PM

## 2017-06-05 ENCOUNTER — Ambulatory Visit: Payer: Medicare Other | Admitting: Family Medicine

## 2017-06-05 DIAGNOSIS — J209 Acute bronchitis, unspecified: Secondary | ICD-10-CM

## 2017-06-05 DIAGNOSIS — R6510 Systemic inflammatory response syndrome (SIRS) of non-infectious origin without acute organ dysfunction: Secondary | ICD-10-CM

## 2017-06-05 LAB — CBC WITH DIFFERENTIAL/PLATELET
BASOS ABS: 0 10*3/uL (ref 0.0–0.1)
BASOS PCT: 0 %
EOS ABS: 0 10*3/uL (ref 0.0–0.7)
EOS PCT: 0 %
HCT: 29.1 % — ABNORMAL LOW (ref 36.0–46.0)
Hemoglobin: 9.2 g/dL — ABNORMAL LOW (ref 12.0–15.0)
LYMPHS PCT: 35 %
Lymphs Abs: 4.3 10*3/uL — ABNORMAL HIGH (ref 0.7–4.0)
MCH: 27.3 pg (ref 26.0–34.0)
MCHC: 31.6 g/dL (ref 30.0–36.0)
MCV: 86.4 fL (ref 78.0–100.0)
Monocytes Absolute: 0.9 10*3/uL (ref 0.1–1.0)
Monocytes Relative: 7 %
Neutro Abs: 7.1 10*3/uL (ref 1.7–7.7)
Neutrophils Relative %: 58 %
PLATELETS: 319 10*3/uL (ref 150–400)
RBC: 3.37 MIL/uL — AB (ref 3.87–5.11)
RDW: 15.1 % (ref 11.5–15.5)
WBC: 12.3 10*3/uL — AB (ref 4.0–10.5)

## 2017-06-05 LAB — GLUCOSE, CAPILLARY
GLUCOSE-CAPILLARY: 165 mg/dL — AB (ref 65–99)
Glucose-Capillary: 138 mg/dL — ABNORMAL HIGH (ref 65–99)
Glucose-Capillary: 239 mg/dL — ABNORMAL HIGH (ref 65–99)
Glucose-Capillary: 284 mg/dL — ABNORMAL HIGH (ref 65–99)

## 2017-06-05 LAB — BASIC METABOLIC PANEL
ANION GAP: 4 — AB (ref 5–15)
BUN: 20 mg/dL (ref 6–20)
CALCIUM: 8.6 mg/dL — AB (ref 8.9–10.3)
CO2: 25 mmol/L (ref 22–32)
Chloride: 107 mmol/L (ref 101–111)
Creatinine, Ser: 1.29 mg/dL — ABNORMAL HIGH (ref 0.44–1.00)
GFR calc Af Amer: 56 mL/min — ABNORMAL LOW (ref >60)
GFR, EST NON AFRICAN AMERICAN: 49 mL/min — AB (ref >60)
Glucose, Bld: 184 mg/dL — ABNORMAL HIGH (ref 65–99)
POTASSIUM: 3.6 mmol/L (ref 3.5–5.1)
SODIUM: 136 mmol/L (ref 135–145)

## 2017-06-05 MED ORDER — OXYCODONE-ACETAMINOPHEN 5-325 MG PO TABS
1.0000 | ORAL_TABLET | ORAL | Status: DC | PRN
  Administered 2017-06-05 – 2017-06-07 (×7): 1 via ORAL
  Filled 2017-06-05 (×7): qty 1

## 2017-06-05 MED ORDER — CEPHALEXIN 500 MG PO CAPS
500.0000 mg | ORAL_CAPSULE | Freq: Three times a day (TID) | ORAL | Status: AC
  Administered 2017-06-05: 500 mg via ORAL
  Filled 2017-06-05: qty 1

## 2017-06-05 MED ORDER — SODIUM CHLORIDE 0.9 % IV SOLN
INTRAVENOUS | Status: DC
  Administered 2017-06-05 – 2017-06-07 (×3): via INTRAVENOUS

## 2017-06-05 MED ORDER — GUAIFENESIN-DM 100-10 MG/5ML PO SYRP
10.0000 mL | ORAL_SOLUTION | Freq: Four times a day (QID) | ORAL | Status: DC
  Administered 2017-06-05 – 2017-06-09 (×15): 10 mL via ORAL
  Filled 2017-06-05 (×15): qty 10

## 2017-06-05 NOTE — Progress Notes (Signed)
PULMONARY / CRITICAL CARE MEDICINE   Name: [name] MRN: [id number] DOB: 1970-10-13 PCP Arnoldo Morale, MD LOS 3 as of 06/05/2017     ADMISSION DATE:  06/02/2017 CONSULTATION DATE:  06/03/17  brief   45 year old aaf w/ known h/o asthma and tracheostomy dependence d/t subglottal stenosis. Her subglottal stenosis is being managed at [facility] and they are hopefully working towards decannulation at some point. She presents to the ER on 9/11 w/ several day h/o increased nasal d/c, more after meals, and progressive cough which has been productive of clear sputum and sometimes rarely blood tinged. She had fever on 9/10. As cough progressed she had associated progressive shortness of breath & some chest discomfort. This did not improve w/ rescue BDs so she came to the ER  ->in er CXR clear ->lactic acid elevated, got IVFs, abx and PCCM asked to eval  PAST MEDICAL HISTORY :  She  has a past medical history of Arthritis; Asthma; Chronic back pain; Chronic chest pain; Depression; DM (diabetes mellitus) ([place]); GERD (gastroesophageal reflux disease); Headache(784.0); Hypertension; Hypokalemia; Respiratory disease (05/2017); and Tracheostomy in place [facility]) (02/2017).  PAST SURGICAL HISTORY: She  has a past surgical history that includes Cesarean section; Appendectomy; Vesicovaginal fistula closure w/ TAH (2009); Hernia repair; Cholecystectomy (N/A, 03/02/2014); Rotator cuff repair; Panendoscopy (N/A, 03/04/2017); and Tracheostomy (02/2017).    SUBJECTIVE/OVERNIGHT/INTERVAL HX 9/13 - Being tx as aasthma ae per primar notes. This AM moderate thick tan sputum that clogged up trach inner cannula. Patient reported pain from trach   VITAL SIGNS: BP 136/83 (BP Location: Left Arm)   Pulse 73   Temp 98.9 F (37.2 C) (Oral)   Resp 18   Ht 5' 1"  (1.549 m)   Wt 106.5 kg (234 lb 12.8 oz)   SpO2 100%   BMI 44.37 kg/m   HEMODYNAMICS:    VENTILATOR SETTINGS: FiO2 (%):  [28 %] 28 %  INTAKE  / OUTPUT: I/O last 3 completed shifts: In: 480 [P.O.:480] Out: 1100 [Urine:1100]     EXAM  General Appearance:    Looks chronically ill OBESE - +  Head:    Normocephalic, without obvious abnormality, atraumatic  Eyes:    PERRL - yes, conjunctiva/corneas - cl;ear      Ears:    Normal external ear canals, both ears  Nose:   NG tube - no  Throat:  ETT TUBE - no but has TRACH , OG tube - no  Neck:   Supple,  No enlargement/tenderness/nodules     Lungs:     Clear to auscultation bilaterally,   Chest wall:    No deformity  Heart:    S1 and S2 normal, no murmur, CVP - no.  Pressors - no  Abdomen:     Soft, no masses, no organomegaly  Genitalia:    Not done  Rectal:   not done  Extremities:   Extremities- intact     Skin:   Intact in exposed areas .      Neurologic:   Sedation - none -> RASS - +1 . Moves all 4s - yes. CAM-ICU - neg for delirium . Orientation - x3+    MEDS  . amLODipine  10 mg Oral Daily  . atorvastatin  20 mg Oral Daily  . budesonide (PULMICORT) nebulizer solution  0.5 mg Nebulization BID  . carvedilol  6.25 mg Oral BID WC  . chlorhexidine  15 mL Mouth Rinse BID  . chlorhexidine gluconate (MEDLINE KIT)  15 mL Mouth  Rinse BID  . enoxaparin (LOVENOX) injection  40 mg Subcutaneous Q24H  . fluticasone  2 spray Each Nare BID  . gabapentin  300 mg Oral TID  . insulin aspart  0-20 Units Subcutaneous TID WC  . insulin aspart  0-5 Units Subcutaneous QHS  . insulin glargine  25 Units Subcutaneous QHS  . ipratropium  2 spray Each Nare TID  . ipratropium-albuterol  3 mL Nebulization BID  . mouth rinse  15 mL Mouth Rinse q12n4p  . pantoprazole  40 mg Oral Daily  . sertraline  100 mg Per Tube Daily  . topiramate  100 mg Oral BID     LABS  PULMONARY No results for input(s): PHART, PCO2ART, PO2ART, HCO3, TCO2, O2SAT in the last 168 hours.  Invalid input(s): PCO2, PO2  CBC  Recent Labs Lab 06/03/17 0401 06/04/17 0227 06/05/17 0236  HGB 9.5* 9.2* 9.2*  HCT  30.0* 29.7* 29.1*  WBC 14.4* 18.9* 12.3*  PLT 381 356 319    COAGULATION No results for input(s): INR in the last 168 hours.  CARDIAC  No results for input(s): TROPONINI in the last 168 hours. No results for input(s): PROBNP in the last 168 hours.   CHEMISTRY  Recent Labs Lab 06/02/17 1330 06/03/17 0401 06/04/17 0227 06/05/17 0236  NA 138 136 135 136  K 3.1* 5.0 4.6 3.6  CL 105 109 106 107  CO2 18* 19* 23 25  GLUCOSE 220* 324* 322* 184*  BUN 11 11 22* 20  CREATININE 1.14* 1.12* 1.24* 1.29*  CALCIUM 9.4 8.9 9.0 8.6*   Estimated Creatinine Clearance: 60.7 mL/min (A) (by C-G formula based on SCr of 1.29 mg/dL (H)).   LIVER  Recent Labs Lab 06/02/17 1330  AST 21  ALT 16  ALKPHOS 100  BILITOT 0.4  PROT 7.7  ALBUMIN 3.7     INFECTIOUS  Recent Labs Lab 06/02/17 1340 06/02/17 1933 06/02/17 1942 06/03/17 0401 06/03/17 1214 06/04/17 0227  LATICACIDVEN 6.24*  --  10.22*  --  3.2*  --   PROCALCITON  --  <0.10  --  <0.10  --  <0.10     ENDOCRINE CBG (last 3)   Recent Labs  06/04/17 2110 06/05/17 0745 06/05/17 1110  GLUCAP 313* 165* 138*         IMAGING x48h  - image(s) personally visualized  -   highlighted in bold No results found.     ASSESSMENT and PLAN  Tracheal stenosis Might need lary  Rx with abx - below and then reasess  Severe asthma with acute exacerbation Primary MD chart reivw suggest ae-asthma Was on medrol as of you yesterday  Plan - primary MD to consider steroid duration  - continue nebs - prmary MD to consider dc coreg in setting of asthma hx  Acute bronchitis Likely has this new  Plan cephalexin 532m three times daily x  5 days     FAMILY  - Updates: 06/05/2017 --> patient updated  - Inter-disciplinary family meet or Palliative Care meeting due by:  DAy 7. Current LOS is LOS 3 days  CODE STATUS    Code Status Orders        Start     Ordered   06/02/17 1605  Full code  Continuous     06/02/17  1608    Code Status History    Date Active Date Inactive Code Status Order ID Comments User Context   05/06/2017 11:52 PM 05/08/2017  5:17 PM Full Code [id number] [place],  Suann Larry, MD ED   02/27/2017  2:48 PM 03/04/2017  1:45 PM Full Code [id number]  Bary Leriche, PA-C Inpatient   02/19/2017 12:46 AM 02/27/2017  2:26 PM Full Code [id number]  Carlyle Dolly, MD ED   01/30/2015  7:23 PM 01/31/2015  5:55 AM Full Code [id number]  Phillips Grout, MD Inpatient   06/04/2014  5:15 PM 06/06/2014  7:49 PM Full Code [id number]  Otho Bellows, MD Inpatient   02/26/2014 10:09 PM 03/04/2014  4:12 PM Full Code [id number]  Delfina Redwood, MD Inpatient   04/27/2013  2:18 PM 05/01/2013  3:30 PM Full Code [id number]  Louellen Molder, MD Inpatient   08/31/2011  1:39 PM 09/01/2011 12:30 AM Full Code [id number]  Lawyer, Resa Miner, PA-C ED        DISPO Per triad      Dr. Brand Males, M.D., [facility] Pulmonary and Critical Care Medicine Staff Physician [facility] Pager: [phone], If no answer or between  15:00h - 7:00h: call [phone]  06/05/2017 1:27 PM

## 2017-06-05 NOTE — Assessment & Plan Note (Signed)
Likely has this new  Plan cephalexin 500mg  three times daily x  5 days

## 2017-06-05 NOTE — Progress Notes (Signed)
RT Note: RT was called to patient bedside by RN for sob. RT had to lavage suction patient, moderate thick tan, pink tinged sputum was obtained, RT changed inner cannula that was clogged up with sputum as well. No apparent complications, patient no longer in distress, WOB has returned to normal, sats are 100%, vitals are stable. Patient did however state she was in pain from trach, RN aware and is getting her pain meds. RT will continue to monitor.

## 2017-06-05 NOTE — Assessment & Plan Note (Signed)
Primary MD chart reivw suggest ae-asthma Was on medrol as of you yesterday  Plan - primary MD to consider steroid duration  - continue nebs - prmary MD to consider dc coreg in setting of asthma hx

## 2017-06-05 NOTE — Assessment & Plan Note (Signed)
Might need lary  Rx with abx - below and then reasess

## 2017-06-05 NOTE — Progress Notes (Signed)
PROGRESS NOTE    [name]  MGN:[id number] DOB: 28-Nov-1970 DOA: 06/02/2017 PCP: Arnoldo Morale, MD    Brief Narrative:  46 year old female who presented with dyspnea. Patient is known to have significant history for asthma, obstructive sleep apnea, hypertension, diabetes mellitus type 2, and morbid obesity. Patient had recent tracheostomy July 2017, due to respiratory failure, diagnosed with subglottic stenosis. She had several days of increased nasal drainage, and upper respiratory tract infection symptoms, evolving into dyspnea which was refractory to bronchodilator use at home. Initial physical examination, blood pressure 148/79, heart rate 110, respiratory 22 to 40, oxygen saturation 98 to 100% on supplemental oxygen. Moist mucous membranes, neck with tracheostomy in place, lungs had decreased breath sounds bilaterally and coarse expiratory wheezing, heart S1-S2 present rhythmic, abdomen was protuberant, soft nontender, no lower extremity edema. Sodium 138, potassium 3.1, chloride 105, bicarbonate 18, glucose 220, BUN 11, creatinine 1.14, white count 12.1, hemoglobin 11.0, hematocrit 33.2, platelets 424, venous lactic acid 6.2, urinalysis negative for infection, EKG with sinus tachycardia, normal intervals, normal axis. Chest film, not rotated, well penetrated, hypo-inflated, no effusions, infiltrates or pneumothorax. CT chest negative for pulmonary embolus, no infiltrates.  Patient admitted to the hospital with working diagnosis of acute hypoxic respiratory failure due to asthma exacerbation   Assessment & Plan:   Principal Problem:   Acute respiratory distress Active Problems:   Sleep apnea, obstructive   Essential hypertension   Type 2 diabetes mellitus with diabetic polyneuropathy, without long-term current use of insulin (HCC)   Morbid obesity (HCC)   Chronic pain syndrome   Severe asthma with acute exacerbation   Generalized anxiety disorder   Tracheostomy  status (HCC)   Hypokalemia   Elevated lactic acid level   1. Asthma exacerbation with acute hypoxemic respiratory failure. Improved ventilation and with no significant signs of bronchospasm, will hold on further systemic steroids and will continue with aggressive bronchodilator therapy. Patient reported bloody trach secretions, likely due to bronchitis/ trachitis, will continue supportive care, antitussive therapy and analgesics. Continue trach collar, keep oxygen saturation above 92%.   2. T2DM. Uncontrolled hyperglycemia. Capillary glucose improved down to 165 and 138 this am, will continue to hold on systemic steroids, continue insulin sliding scale and basal insulin therapy, patient tolerating po well.   3. HTN. Continue with amlodipine, and carvedilol, systolic blood pressure 916 to 130.   4. AKI with hyperkalemia. Rising cr up to 1,29, from 1,24 and 1,12, poor oral intake, will add gentle IV fluids and will follow on renal pane, in am, K down to 3,6 and serum bicarbonate at 25. Will continue to hold on non steroidal anti-inflammatory agents.   5. Anemia. Has remained stable.   6. Depression. On sertraline and topiramate, no confusion or agitation.   DVT prophylaxis:enoxaparin Code Status:full Family Communication:No family at the bedside  Disposition Plan:Home    Consultants:  Pulmonary  Procedures:    Antimicrobials:     Subjective: This am patient complains of chest pressure, moderate to severe in intensity, persistent in nature with no radiation, associated with cough and hemoptysis. No nausea or vomiting, able to ambulate well with physical therapy.   Objective: Vitals:   06/05/17 0038 06/05/17 0359 06/05/17 0431 06/05/17 0801  BP: 128/73  (!) 138/92   Pulse: 80 72 65 74  Resp: 18 18  18   Temp: 98.3 F (36.8 C)  98.7 F (37.1 C)   TempSrc: Oral  Oral   SpO2: 100% 100% 100% 99%  Weight:   106.5  kg (234 lb 12.8 oz)   Height:         Intake/Output Summary (Last 24 hours) at 06/05/17 0815 Last data filed at 06/05/17 0400  Gross per 24 hour  Intake              240 ml  Output              500 ml  Net             -260 ml   Filed Weights   06/03/17 0549 06/04/17 0514 06/05/17 0431  Weight: 105.1 kg (231 lb 9.6 oz) 105.1 kg (231 lb 12.8 oz) 106.5 kg (234 lb 12.8 oz)    Examination:  General: deconditioned and ill looking appearing, in pain.  Neurology: Awake and alert, non focal  E ENT: no pallor, no icterus, oral mucosa moist. Trach in place Cardiovascular: S1-S2 present, rhythmic, no gallops, rubs, or murmurs. No jugular venous distention, no lower extremity edema. Pulmonary: vesicular breath sounds bilaterally, mild decreased air movement, no wheezing, rhonchi, scattered rales at bases. Gastrointestinal. Abdomen protuberant, no organomegaly, non tender, no rebound or guarding Skin. No rashes Musculoskeletal: no joint deformities     Data Reviewed: I have personally reviewed following labs and imaging studies  CBC:  Recent Labs Lab 06/02/17 1330 06/03/17 0401 06/04/17 0227 06/05/17 0236  WBC 12.1* 14.4* 18.9* 12.3*  NEUTROABS 5.2  --  16.0* 7.1  HGB 11.0* 9.5* 9.2* 9.2*  HCT 33.2* 30.0* 29.7* 29.1*  MCV 83.4 83.8 85.6 86.4  PLT 424* 381 356 161   Basic Metabolic Panel:  Recent Labs Lab 06/02/17 1330 06/03/17 0401 06/04/17 0227 06/05/17 0236  NA 138 136 135 136  K 3.1* 5.0 4.6 3.6  CL 105 109 106 107  CO2 18* 19* 23 25  GLUCOSE 220* 324* 322* 184*  BUN 11 11 22* 20  CREATININE 1.14* 1.12* 1.24* 1.29*  CALCIUM 9.4 8.9 9.0 8.6*   GFR: Estimated Creatinine Clearance: 60.7 mL/min (A) (by C-G formula based on SCr of 1.29 mg/dL (H)). Liver Function Tests:  Recent Labs Lab 06/02/17 1330  AST 21  ALT 16  ALKPHOS 100  BILITOT 0.4  PROT 7.7  ALBUMIN 3.7   No results for input(s): LIPASE, AMYLASE in the last 168 hours. No results for input(s): AMMONIA in the last 168  hours. Coagulation Profile: No results for input(s): INR, PROTIME in the last 168 hours. Cardiac Enzymes: No results for input(s): CKTOTAL, CKMB, CKMBINDEX, TROPONINI in the last 168 hours. BNP (last 3 results) No results for input(s): PROBNP in the last 8760 hours. HbA1C:  Recent Labs  06/02/17 1933  HGBA1C 8.3*   CBG:  Recent Labs Lab 06/04/17 0733 06/04/17 1114 06/04/17 1617 06/04/17 2110 06/05/17 0745  GLUCAP 237* 258* 384* 313* 165*   Lipid Profile: No results for input(s): CHOL, HDL, LDLCALC, TRIG, CHOLHDL, LDLDIRECT in the last 72 hours. Thyroid Function Tests: No results for input(s): TSH, T4TOTAL, FREET4, T3FREE, THYROIDAB in the last 72 hours. Anemia Panel: No results for input(s): VITAMINB12, FOLATE, FERRITIN, TIBC, IRON, RETICCTPCT in the last 72 hours.    Radiology Studies: I have reviewed all of the imaging during this hospital visit personally     Scheduled Meds: . amLODipine  10 mg Oral Daily  . artificial tears  1 application Both Eyes QHS  . atorvastatin  20 mg Oral Daily  . budesonide (PULMICORT) nebulizer solution  0.5 mg Nebulization BID  . carvedilol  6.25 mg Oral BID WC  .  chlorhexidine  15 mL Mouth Rinse BID  . chlorhexidine gluconate (MEDLINE KIT)  15 mL Mouth Rinse BID  . enoxaparin (LOVENOX) injection  40 mg Subcutaneous Q24H  . fluticasone  2 spray Each Nare BID  . gabapentin  300 mg Oral TID  . insulin aspart  0-20 Units Subcutaneous TID WC  . insulin aspart  0-5 Units Subcutaneous QHS  . insulin glargine  25 Units Subcutaneous QHS  . ipratropium  2 spray Each Nare TID  . ipratropium-albuterol  3 mL Nebulization BID  . mouth rinse  15 mL Mouth Rinse q12n4p  . oxyCODONE-acetaminophen  2 tablet Oral Once  . pantoprazole  40 mg Oral Daily  . sertraline  100 mg Per Tube Daily  . topiramate  100 mg Oral BID   Continuous Infusions:   LOS: 3 days        Kennah Hehr Gerome Apley, MD [facility] Pager [phone]

## 2017-06-06 DIAGNOSIS — E876 Hypokalemia: Secondary | ICD-10-CM

## 2017-06-06 LAB — GLUCOSE, CAPILLARY
GLUCOSE-CAPILLARY: 195 mg/dL — AB (ref 65–99)
Glucose-Capillary: 183 mg/dL — ABNORMAL HIGH (ref 65–99)
Glucose-Capillary: 251 mg/dL — ABNORMAL HIGH (ref 65–99)
Glucose-Capillary: 166 mg/dL — ABNORMAL HIGH (ref 65–99)

## 2017-06-06 LAB — MAGNESIUM: Magnesium: 1.9 mg/dL (ref 1.7–2.4)

## 2017-06-06 LAB — BASIC METABOLIC PANEL
ANION GAP: 7 (ref 5–15)
BUN: 18 mg/dL (ref 6–20)
CALCIUM: 8.1 mg/dL — AB (ref 8.9–10.3)
CHLORIDE: 108 mmol/L (ref 101–111)
CO2: 22 mmol/L (ref 22–32)
Creatinine, Ser: 1.16 mg/dL — ABNORMAL HIGH (ref 0.44–1.00)
GFR calc non Af Amer: 55 mL/min — ABNORMAL LOW (ref >60)
Glucose, Bld: 258 mg/dL — ABNORMAL HIGH (ref 65–99)
Potassium: 3.8 mmol/L (ref 3.5–5.1)
Sodium: 137 mmol/L (ref 135–145)

## 2017-06-06 LAB — LACTIC ACID, PLASMA: LACTIC ACID, VENOUS: 1.2 mmol/L (ref 0.5–1.9)

## 2017-06-06 LAB — PHOSPHORUS: Phosphorus: 4.4 mg/dL (ref 2.5–4.6)

## 2017-06-06 MED ORDER — OXYCODONE HCL ER 10 MG PO T12A
10.0000 mg | EXTENDED_RELEASE_TABLET | Freq: Two times a day (BID) | ORAL | Status: DC
  Administered 2017-06-06 – 2017-06-08 (×6): 10 mg via ORAL
  Filled 2017-06-06 (×6): qty 1

## 2017-06-06 NOTE — Progress Notes (Signed)
PROGRESS NOTE    Harry Bark [name]  JFH:[id number] DOB: 1971/02/14 DOA: 06/02/2017 PCP: Arnoldo Morale, MD    Brief Narrative:  46 year old female who presented with dyspnea. Patient is known to have significant history for asthma, obstructive sleep apnea, hypertension, diabetes mellitus type 2, and morbid obesity. Patient had recent tracheostomy July 2017, due to respiratory failure, diagnosed with subglottic stenosis. She had several days of increased nasal drainage, and upper respiratory tract infection symptoms, evolving into dyspnea which was refractory to bronchodilator use at home. Initial physical examination, blood pressure 148/79, heart rate 110, respiratory 22 to 40, oxygen saturation 98 to 100% on supplemental oxygen. Moist mucous membranes, neck with tracheostomy in place, lungs had decreased breath sounds bilaterally and coarse expiratory wheezing, heart S1-S2 present rhythmic, abdomen was protuberant, soft nontender, no lower extremity edema. Sodium 138, potassium 3.1, chloride 105, bicarbonate 18, glucose 220, BUN 11, creatinine 1.14, white count 12.1, hemoglobin 11.0, hematocrit 33.2, platelets 424, venous lactic acid 6.2, urinalysis negative for infection, EKG with sinus tachycardia, normal intervals, normal axis. Chest film, not rotated, well penetrated, hypo-inflated, no effusions, infiltrates or pneumothorax. CT chest negative for pulmonary embolus, no infiltrates.  Patient admitted to the hospital with working diagnosis of acute hypoxic respiratory failure due to asthma exacerbation   Assessment & Plan:   Principal Problem:   Acute respiratory distress Active Problems:   Sleep apnea, obstructive   Acute bronchitis   Essential hypertension   Type 2 diabetes mellitus with diabetic polyneuropathy, without long-term current use of insulin (HCC)   Morbid obesity (HCC)   Chronic pain syndrome   Severe asthma with acute exacerbation   Generalized anxiety disorder   Tracheal stenosis   Tracheostomy status (HCC)   Hypokalemia   Elevated lactic acid level   1. Asthma exacerbation with acute hypoxemic respiratory failure and bronchitis. Today with adequate air movement, patient complains of pain at the trach site, worse with cough and movement. Will continue scheduled anti-tussive therapy, will add long acting analgesics. Continue aggressive bronchodilator therapy. Oxymetry monitoring and supplemental oxygen per trach collar. Will need to follow up with [facility] for further trach care. Will plan to discharge home in am if pain improves.    2. T2DM. Uncontrolled hyperglycemia. Continue insulin sliding scale, capillary glucose 138, 239, 284, 183, 195. Patient tolerating po well.   3. HTN. On amlodipine, andcarvedilol, blood pressure controlled.    4. AKI with hyperkalemia. Continue to improved renal function, serum cr down to 1.16, serum K at 3,8, will continue to avoid non steroidal anti-inflammatory agents, or other nephrotoxic medications, will continue gentle hydration with IV fluids.   5. Anemia. stable  6. Depression. Will continue withsertraline and topiramate.   DVT prophylaxis:enoxaparin Code Status:full Family Communication:I spoke with patient's family at the bedside and all questions were addressed.  Disposition Plan:Home    Consultants:  Pulmonary  Procedures:    Antimicrobials:     Subjective: Patient having significant pain at the trach site, worse with cough and manipulation of the trach, improved with analgesics, no radiation. The pain has been persistent since trach was placed, burt has been worse lately. Trach followed at [facility], follow up scheduled.    Objective: Vitals:   06/05/17 2006 06/05/17 2350 06/06/17 0451 06/06/17 0735  BP: 117/69  119/63   Pulse: 86 88  83  Resp: 18 16 18 18   Temp: 98.3 F (36.8 C)  98.3 F (36.8 C)   TempSrc: Oral  Oral   SpO2: 100%  100%  99%  Weight:        Height:        Intake/Output Summary (Last 24 hours) at 06/06/17 0936 Last data filed at 06/05/17 1648  Gross per 24 hour  Intake            127.5 ml  Output              600 ml  Net           -472.5 ml   Filed Weights   06/03/17 0549 06/04/17 0514 06/05/17 0431  Weight: 105.1 kg (231 lb 9.6 oz) 105.1 kg (231 lb 12.8 oz) 106.5 kg (234 lb 12.8 oz)    Examination:  General: patient looks in pain Neurology: Awake and alert, non focal  E ENT: no pallor, no icterus, oral mucosa moist Trach site with no edema or erythema, pain while moving trach side to side.  Cardiovascular: No JVD. S1-S2 present, rhythmic, no gallops, rubs, or murmurs. No jugular venous distention, no lower extremity edema. Pulmonary: vesicular breath sounds bilaterally, adequate air movement, no wheezing, rhonchi or rales. Gastrointestinal. Abdomen flat, no organomegaly, non tender, no rebound or guarding Skin. No rashes Musculoskeletal: no joint deformities     Data Reviewed: I have personally reviewed following labs and imaging studies  CBC:  Recent Labs Lab 06/02/17 1330 06/03/17 0401 06/04/17 0227 06/05/17 0236  WBC 12.1* 14.4* 18.9* 12.3*  NEUTROABS 5.2  --  16.0* 7.1  HGB 11.0* 9.5* 9.2* 9.2*  HCT 33.2* 30.0* 29.7* 29.1*  MCV 83.4 83.8 85.6 86.4  PLT 424* 381 356 373   Basic Metabolic Panel:  Recent Labs Lab 06/02/17 1330 06/03/17 0401 06/04/17 0227 06/05/17 0236 06/06/17 0032  NA 138 136 135 136 137  K 3.1* 5.0 4.6 3.6 3.8  CL 105 109 106 107 108  CO2 18* 19* 23 25 22   GLUCOSE 220* 324* 322* 184* 258*  BUN 11 11 22* 20 18  CREATININE 1.14* 1.12* 1.24* 1.29* 1.16*  CALCIUM 9.4 8.9 9.0 8.6* 8.1*  MG  --   --   --   --  1.9  PHOS  --   --   --   --  4.4   GFR: Estimated Creatinine Clearance: 67.5 mL/min (A) (by C-G formula based on SCr of 1.16 mg/dL (H)). Liver Function Tests:  Recent Labs Lab 06/02/17 1330  AST 21  ALT 16  ALKPHOS 100  BILITOT 0.4  PROT 7.7  ALBUMIN  3.7   No results for input(s): LIPASE, AMYLASE in the last 168 hours. No results for input(s): AMMONIA in the last 168 hours. Coagulation Profile: No results for input(s): INR, PROTIME in the last 168 hours. Cardiac Enzymes: No results for input(s): CKTOTAL, CKMB, CKMBINDEX, TROPONINI in the last 168 hours. BNP (last 3 results) No results for input(s): PROBNP in the last 8760 hours. HbA1C: No results for input(s): HGBA1C in the last 72 hours. CBG:  Recent Labs Lab 06/05/17 0745 06/05/17 1110 06/05/17 1629 06/05/17 2202 06/06/17 0810  GLUCAP 165* 138* 239* 284* 183*   Lipid Profile: No results for input(s): CHOL, HDL, LDLCALC, TRIG, CHOLHDL, LDLDIRECT in the last 72 hours. Thyroid Function Tests: No results for input(s): TSH, T4TOTAL, FREET4, T3FREE, THYROIDAB in the last 72 hours. Anemia Panel: No results for input(s): VITAMINB12, FOLATE, FERRITIN, TIBC, IRON, RETICCTPCT in the last 72 hours.    Radiology Studies: I have reviewed all of the imaging during this hospital visit personally     Scheduled Meds: .  amLODipine  10 mg Oral Daily  . atorvastatin  20 mg Oral Daily  . budesonide (PULMICORT) nebulizer solution  0.5 mg Nebulization BID  . carvedilol  6.25 mg Oral BID WC  . chlorhexidine  15 mL Mouth Rinse BID  . chlorhexidine gluconate (MEDLINE KIT)  15 mL Mouth Rinse BID  . enoxaparin (LOVENOX) injection  40 mg Subcutaneous Q24H  . fluticasone  2 spray Each Nare BID  . gabapentin  300 mg Oral TID  . guaiFENesin-dextromethorphan  10 mL Oral Q6H  . insulin aspart  0-20 Units Subcutaneous TID WC  . insulin aspart  0-5 Units Subcutaneous QHS  . insulin glargine  25 Units Subcutaneous QHS  . ipratropium  2 spray Each Nare TID  . ipratropium-albuterol  3 mL Nebulization BID  . mouth rinse  15 mL Mouth Rinse q12n4p  . oxyCODONE  10 mg Oral Q12H  . pantoprazole  40 mg Oral Daily  . sertraline  100 mg Per Tube Daily  . topiramate  100 mg Oral BID   Continuous  Infusions: . sodium chloride 50 mL/hr at 06/05/17 1648     LOS: 4 days        Cloey Sferrazza Gerome Apley, MD [facility] Pager [phone]

## 2017-06-06 NOTE — Progress Notes (Signed)
RT notified about pt complaining about trach.  Upon assessment, RT observed sutures to detached from pt, only one suture is secured.  Pt's trach is secured with trach collar.

## 2017-06-06 NOTE — Progress Notes (Signed)
Respiratory called and requested that they assess pt's trach after pt complained that sutures came out (see Resp. note) Pt also complained of 9/10 pain. Pt's trach/airway intact and vs stable. O2 currently at 98%. Pain medication given and Hospitalist paged and informed that sutures have come out. Will continue to montior. Pt significant other is at bedside.

## 2017-06-07 DIAGNOSIS — J398 Other specified diseases of upper respiratory tract: Secondary | ICD-10-CM

## 2017-06-07 LAB — CULTURE, BLOOD (ROUTINE X 2)
Culture: NO GROWTH
Special Requests: ADEQUATE

## 2017-06-07 LAB — GLUCOSE, CAPILLARY
GLUCOSE-CAPILLARY: 185 mg/dL — AB (ref 65–99)
GLUCOSE-CAPILLARY: 175 mg/dL — AB (ref 65–99)
GLUCOSE-CAPILLARY: 201 mg/dL — AB (ref 65–99)
Glucose-Capillary: 135 mg/dL — ABNORMAL HIGH (ref 65–99)

## 2017-06-07 LAB — PHOSPHORUS: Phosphorus: 4.4 mg/dL (ref 2.5–4.6)

## 2017-06-07 LAB — MAGNESIUM: MAGNESIUM: 1.9 mg/dL (ref 1.7–2.4)

## 2017-06-07 MED ORDER — IBUPROFEN 200 MG PO TABS
400.0000 mg | ORAL_TABLET | Freq: Three times a day (TID) | ORAL | Status: DC
  Administered 2017-06-07 (×3): 400 mg via ORAL
  Filled 2017-06-07 (×3): qty 2

## 2017-06-07 MED ORDER — OXYCODONE-ACETAMINOPHEN 5-325 MG PO TABS
2.0000 | ORAL_TABLET | ORAL | Status: DC | PRN
Start: 2017-06-07 — End: 2017-06-09
  Administered 2017-06-07 – 2017-06-09 (×6): 2 via ORAL
  Filled 2017-06-07 (×6): qty 2

## 2017-06-07 MED ORDER — CYCLOBENZAPRINE HCL 10 MG PO TABS
5.0000 mg | ORAL_TABLET | Freq: Three times a day (TID) | ORAL | Status: DC
  Administered 2017-06-07 – 2017-06-09 (×6): 5 mg via ORAL
  Filled 2017-06-07 (×6): qty 1

## 2017-06-07 MED ORDER — SUCRALFATE 1 GM/10ML PO SUSP
1.0000 g | Freq: Three times a day (TID) | ORAL | Status: DC
  Administered 2017-06-07 – 2017-06-09 (×7): 1 g via ORAL
  Filled 2017-06-07 (×7): qty 10

## 2017-06-07 NOTE — Plan of Care (Signed)
Problem: Pain Managment: Goal: General experience of comfort will improve Outcome: Not Progressing Continues with uncontrolled Left neck and upper midsternal chest pain. Ibuprofen added. Heat pack provided to left neck with minimal relief.

## 2017-06-07 NOTE — Progress Notes (Signed)
PROGRESS NOTE    Olamide Lahaie [name]  ZHG:[id number] DOB: 1971-06-11 DOA: 06/02/2017 PCP: Arnoldo Morale, MD    Brief Narrative:  46 year old female who presented with dyspnea. Patient is known to have significant history for asthma, obstructive sleep apnea, hypertension, diabetes mellitus type 2, and morbid obesity. Patient had recent tracheostomy July 2017, due to respiratory failure, diagnosed with subglottic stenosis. She had several days of increased nasal drainage, and upper respiratory tract infection symptoms, evolving into dyspnea which was refractory to bronchodilator use at home. Initial physical examination, blood pressure 148/79, heart rate 110, respiratory 22 to 40, oxygen saturation 98 to 100% on supplemental oxygen. Moist mucous membranes, neck with tracheostomy in place, lungs had decreased breath sounds bilaterally and coarse expiratory wheezing, heart S1-S2 present rhythmic, abdomen was protuberant, soft nontender, no lower extremity edema. Sodium 138, potassium 3.1, chloride 105, bicarbonate 18, glucose 220, BUN 11, creatinine 1.14, white count 12.1, hemoglobin 11.0, hematocrit 33.2, platelets 424, venous lactic acid 6.2, urinalysis negative for infection, EKG with sinus tachycardia, normal intervals, normal axis. Chest film, not rotated, well penetrated, hypo-inflated, no effusions, infiltrates or pneumothorax. CT chest negative for pulmonary embolus, no infiltrates.  Patient admitted to the hospital with working diagnosis of acute hypoxic respiratory failure due to asthma exacerbation.   Asthma clinically improving, now with severe and persistent pain at the trach site with radiation into the chest and back.    Assessment & Plan:   Principal Problem:   Acute respiratory distress Active Problems:   Sleep apnea, obstructive   Acute bronchitis   Essential hypertension   Type 2 diabetes mellitus with diabetic polyneuropathy, without long-term current use of insulin  (HCC)   Morbid obesity (HCC)   Chronic pain syndrome   Severe asthma with acute exacerbation   Generalized anxiety disorder   Tracheal stenosis   Tracheostomy status (HCC)   Hypokalemia   Elevated lactic acid level   1. Asthma exacerbation with acute hypoxemic respiratory failure and bronchitis.Continue to have adequate air movement, but patient complains of pain at the trach site, radiating to her chest and back, which has been refractive to medical therapy. On exam noted reproducible pain at palpation of the left chest wall. Will continue analgesics, with oxycodone and oxycontin, will resume anti-inflammatory therapy with ibuprofen and will add muscle relaxant with cyclobenzaprine. Out of bed as tolerated, plan to discharge home once pain better controlled. Continue bronchodilator therapy, and continue holding on systemic steroids for now.   2. Subglottic stenosis. Old records personally reviewed, patient follows up with Dr. Donald Pore, at [facility], 08/27 underwent: Suspension microdirect laryngoscopy, tracheoscopy, balloon dilation and kenalog injection of subglottic stenosis tracheostomy tube exchange. Plan for follow up neck CT and possible repeat dilatation and likely laser excision of subglottic scarring, if no progress may consider tracheal resection.   3. T2DM. Uncontrolled hyperglycemia. Will continue with insulin sliding scale, for glucose cover and monitoring, capillary glucose 195, 251, 166, 135, 185.    4. HTN. Cpntinue with amlodipine, andcarvedilol, blood pressure controlled, systolic blood pressure on the 100's  5. AKI with hyperkalemia. Renal function improving, will continue gentle hydration with saline, will resume ibuprofen with caution and will follow on renal panel in am.   6. Anemia, multifactorial. Stable with no decompensation.   7. Depression. Onsertraline and topiramate. Pain might be exacerbated by depression.   DVT prophylaxis:enoxaparin Code  Status:full Family Communication:I spoke with patient's family at the bedside and all questions were addressed.  Disposition Plan:Home    Consultants:  Pulmonary  Procedures:    Antimicrobials:     Subjective: Patient with persistent pain at the trach site, 10/10 intensity, worse with coughing, radiated to the chest, and back, only mild improvement with analgesics, no nausea or vomiting.  No significant dyspnea and cough reported to be improving.   Objective: Vitals:   06/07/17 0907 06/07/17 0908 06/07/17 1100 06/07/17 1158  BP:    108/70  Pulse:  80  90  Resp:  18    Temp:      TempSrc:    Oral  SpO2: 100% 100% 100% 100%  Weight:      Height:        Intake/Output Summary (Last 24 hours) at 06/07/17 1204 Last data filed at 06/07/17 0505  Gross per 24 hour  Intake           799.17 ml  Output                0 ml  Net           799.17 ml   Filed Weights   06/04/17 0514 06/05/17 0431 06/07/17 0525  Weight: 105.1 kg (231 lb 12.8 oz) 106.5 kg (234 lb 12.8 oz) 112.5 kg (247 lb 14.7 oz)    Examination:  General: Noted to be in pain Neurology: Awake and alert, non focal  E ENT: no pallor, no icterus, oral mucosa moist Cardiovascular: No JVD. S1-S2 present, rhythmic, no gallops, rubs, or murmurs. No lower extremity edema. Pulmonary: vesicular breath sounds bilaterally, adequate air movement, no wheezing or rales, scattered rhonchi. Gastrointestinal. Abdomen protuberant, no organomegaly, non tender, no rebound or guarding Skin. No rashes Musculoskeletal: no joint deformities, reproducible chest pain on palpation of the chest wall on the right with no skin rash or lesions.      Data Reviewed: I have personally reviewed following labs and imaging studies  CBC:  Recent Labs Lab 06/02/17 1330 06/03/17 0401 06/04/17 0227 06/05/17 0236  WBC 12.1* 14.4* 18.9* 12.3*  NEUTROABS 5.2  --  16.0* 7.1  HGB 11.0* 9.5* 9.2* 9.2*  HCT 33.2* 30.0* 29.7* 29.1*    MCV 83.4 83.8 85.6 86.4  PLT 424* 381 356 847   Basic Metabolic Panel:  Recent Labs Lab 06/02/17 1330 06/03/17 0401 06/04/17 0227 06/05/17 0236 06/06/17 0032 06/07/17 0423  NA 138 136 135 136 137  --   K 3.1* 5.0 4.6 3.6 3.8  --   CL 105 109 106 107 108  --   CO2 18* 19* _0 --   GLUCOSE 220* 324* 322* 184* 258*  --   BUN 11 11 22* 20 18  --   CREATININE 1.14* 1.12* 1.24* 1.29* 1.16*  --   CALCIUM 9.4 8.9 9.0 8.6* 8.1*  --   MG  --   --   --   --  1.9 1.9  PHOS  --   --   --   --  4.4 4.4   GFR: Estimated Creatinine Clearance: 69.8 mL/min (A) (by C-G formula based on SCr of 1.16 mg/dL (H)). Liver Function Tests:  Recent Labs Lab 06/02/17 1330  AST 21  ALT 16  ALKPHOS 100  BILITOT 0.4  PROT 7.7  ALBUMIN 3.7   No results for input(s): LIPASE, AMYLASE in the last 168 hours. No results for input(s): AMMONIA in the last 168 hours. Coagulation Profile: No results for input(s): INR, PROTIME in the last 168 hours. Cardiac Enzymes: No results for input(s): CKTOTAL, CKMB, CKMBINDEX, TROPONINI  in the last 168 hours. BNP (last 3 results) No results for input(s): PROBNP in the last 8760 hours. HbA1C: No results for input(s): HGBA1C in the last 72 hours. CBG:  Recent Labs Lab 06/06/17 1252 06/06/17 1648 06/06/17 2119 06/07/17 0810 06/07/17 1151  GLUCAP 195* 251* 166* 135* 185*   Lipid Profile: No results for input(s): CHOL, HDL, LDLCALC, TRIG, CHOLHDL, LDLDIRECT in the last 72 hours. Thyroid Function Tests: No results for input(s): TSH, T4TOTAL, FREET4, T3FREE, THYROIDAB in the last 72 hours. Anemia Panel: No results for input(s): VITAMINB12, FOLATE, FERRITIN, TIBC, IRON, RETICCTPCT in the last 72 hours.    Radiology Studies: I have reviewed all of the imaging during this hospital visit personally     Scheduled Meds: . amLODipine  10 mg Oral Daily  . atorvastatin  20 mg Oral Daily  . budesonide (PULMICORT) nebulizer solution  0.5 mg Nebulization  BID  . carvedilol  6.25 mg Oral BID WC  . chlorhexidine  15 mL Mouth Rinse BID  . chlorhexidine gluconate (MEDLINE KIT)  15 mL Mouth Rinse BID  . enoxaparin (LOVENOX) injection  40 mg Subcutaneous Q24H  . fluticasone  2 spray Each Nare BID  . gabapentin  300 mg Oral TID  . guaiFENesin-dextromethorphan  10 mL Oral Q6H  . ibuprofen  400 mg Oral TID WC  . insulin aspart  0-20 Units Subcutaneous TID WC  . insulin aspart  0-5 Units Subcutaneous QHS  . insulin glargine  25 Units Subcutaneous QHS  . ipratropium  2 spray Each Nare TID  . ipratropium-albuterol  3 mL Nebulization BID  . mouth rinse  15 mL Mouth Rinse q12n4p  . oxyCODONE  10 mg Oral Q12H  . pantoprazole  40 mg Oral Daily  . sertraline  100 mg Per Tube Daily  . topiramate  100 mg Oral BID   Continuous Infusions: . sodium chloride 50 mL/hr at 06/07/17 0406     LOS: 5 days        Mayre Bury Gerome Apley, MD [facility] Pager [phone]

## 2017-06-08 DIAGNOSIS — R062 Wheezing: Secondary | ICD-10-CM

## 2017-06-08 DIAGNOSIS — Z930 Tracheostomy status: Secondary | ICD-10-CM

## 2017-06-08 DIAGNOSIS — R7989 Other specified abnormal findings of blood chemistry: Secondary | ICD-10-CM

## 2017-06-08 DIAGNOSIS — R0902 Hypoxemia: Secondary | ICD-10-CM

## 2017-06-08 DIAGNOSIS — J45901 Unspecified asthma with (acute) exacerbation: Principal | ICD-10-CM

## 2017-06-08 LAB — CULTURE, BLOOD (ROUTINE X 2)
Culture: NO GROWTH
Culture: NO GROWTH
SPECIAL REQUESTS: ADEQUATE
Special Requests: ADEQUATE

## 2017-06-08 LAB — BASIC METABOLIC PANEL
Anion gap: 4 — ABNORMAL LOW (ref 5–15)
BUN: 21 mg/dL — AB (ref 6–20)
CALCIUM: 8.4 mg/dL — AB (ref 8.9–10.3)
CO2: 26 mmol/L (ref 22–32)
CREATININE: 1.38 mg/dL — AB (ref 0.44–1.00)
Chloride: 107 mmol/L (ref 101–111)
GFR calc non Af Amer: 45 mL/min — ABNORMAL LOW (ref >60)
GFR, EST AFRICAN AMERICAN: 52 mL/min — AB (ref >60)
Glucose, Bld: 212 mg/dL — ABNORMAL HIGH (ref 65–99)
Potassium: 3.9 mmol/L (ref 3.5–5.1)
Sodium: 137 mmol/L (ref 135–145)

## 2017-06-08 LAB — GLUCOSE, CAPILLARY
GLUCOSE-CAPILLARY: 169 mg/dL — AB (ref 65–99)
Glucose-Capillary: 178 mg/dL — ABNORMAL HIGH (ref 65–99)
Glucose-Capillary: 158 mg/dL — ABNORMAL HIGH (ref 65–99)
Glucose-Capillary: 211 mg/dL — ABNORMAL HIGH (ref 65–99)

## 2017-06-08 LAB — MAGNESIUM: Magnesium: 2 mg/dL (ref 1.7–2.4)

## 2017-06-08 LAB — PHOSPHORUS: Phosphorus: 4.1 mg/dL (ref 2.5–4.6)

## 2017-06-08 NOTE — Progress Notes (Signed)
PULMONARY / CRITICAL CARE MEDICINE   Name: [name] MRN: [id number] DOB: 1971/06/04 PCP Arnoldo Morale, MD LOS 6 as of 06/08/2017     ADMISSION DATE:  06/02/2017 CONSULTATION DATE:  06/03/17  brief   46 year old aaf w/ known h/o asthma and tracheostomy dependence d/t subglottal stenosis. Her subglottal stenosis is being managed at [facility] and they are hopefully working towards decannulation at some point. She presents to the ER on 9/11 w/ several day h/o increased nasal d/c, more after meals, and progressive cough which has been productive of clear sputum and sometimes rarely blood tinged. She had fever on 9/10. As cough progressed she had associated progressive shortness of breath & some chest discomfort. This did not improve w/ rescue BDs so she came to the ER  ->in er CXR clear ->lactic acid elevated, got IVFs, abx and PCCM asked to eval   SUBJECTIVE/OVERNIGHT/INTERVAL HX C/o trach pain/tightness. SOB is better.    VITAL SIGNS: BP 118/60 (BP Location: Left Arm)   Pulse 72   Temp 98.3 F (36.8 C) (Oral)   Resp 14   Ht 5' 1"  (1.549 m)   Wt 112.5 kg (247 lb 14.7 oz)   SpO2 99%   BMI 46.84 kg/m   HEMODYNAMICS:    VENTILATOR SETTINGS: FiO2 (%):  [28 %] 28 %  INTAKE / OUTPUT: I/O last 3 completed shifts: In: 1193.3 [I.V.:1193.3] Out: -     EXAM  General:  Obese female, was in NAD texting on phone as I entered the room then became anxious, tearful, mildly tachypneic  HEENT: MM pink/moist, trach c/d  PSY: tearful, anxious  Neuro: awake, alert, appropriate  CV: s1s2 rrr, no m/r/g PULM: even/non-labored, lungs bilaterally diminished, no audible wheeze  MC:RFVO, non-tender, bsx4 active  Extremities: warm/dry, 1+ BLE edema  Skin: no rashes or lesions   MEDS  . amLODipine  10 mg Oral Daily  . atorvastatin  20 mg Oral Daily  . budesonide (PULMICORT) nebulizer solution  0.5 mg Nebulization BID  . carvedilol  6.25 mg Oral BID WC  . chlorhexidine  15  mL Mouth Rinse BID  . chlorhexidine gluconate (MEDLINE KIT)  15 mL Mouth Rinse BID  . cyclobenzaprine  5 mg Oral TID  . enoxaparin (LOVENOX) injection  40 mg Subcutaneous Q24H  . fluticasone  2 spray Each Nare BID  . gabapentin  300 mg Oral TID  . guaiFENesin-dextromethorphan  10 mL Oral Q6H  . insulin aspart  0-20 Units Subcutaneous TID WC  . insulin aspart  0-5 Units Subcutaneous QHS  . insulin glargine  25 Units Subcutaneous QHS  . ipratropium  2 spray Each Nare TID  . ipratropium-albuterol  3 mL Nebulization BID  . mouth rinse  15 mL Mouth Rinse q12n4p  . oxyCODONE  10 mg Oral Q12H  . pantoprazole  40 mg Oral Daily  . sertraline  100 mg Per Tube Daily  . sucralfate  1 g Oral TID WC & HS  . topiramate  100 mg Oral BID     LABS  PULMONARY No results for input(s): PHART, PCO2ART, PO2ART, HCO3, TCO2, O2SAT in the last 168 hours.  Invalid input(s): PCO2, PO2  CBC  Recent Labs Lab 06/03/17 0401 06/04/17 0227 06/05/17 0236  HGB 9.5* 9.2* 9.2*  HCT 30.0* 29.7* 29.1*  WBC 14.4* 18.9* 12.3*  PLT 381 356 319    COAGULATION No results for input(s): INR in the last 168 hours.  CARDIAC  No results for input(s): TROPONINI  in the last 168 hours. No results for input(s): PROBNP in the last 168 hours.   CHEMISTRY  Recent Labs Lab 06/03/17 0401 06/04/17 0227 06/05/17 0236 06/06/17 0032 06/07/17 0423 06/08/17 0150  NA 136 135 136 137  --  137  K 5.0 4.6 3.6 3.8  --  3.9  CL 109 106 107 108  --  107  CO2 19* 23 25 22   --  26  GLUCOSE 324* 322* 184* 258*  --  212*  BUN 11 22* 20 18  --  21*  CREATININE 1.12* 1.24* 1.29* 1.16*  --  1.38*  CALCIUM 8.9 9.0 8.6* 8.1*  --  8.4*  MG  --   --   --  1.9 1.9 2.0  PHOS  --   --   --  4.4 4.4 4.1   Estimated Creatinine Clearance: 58.6 mL/min (A) (by C-G formula based on SCr of 1.38 mg/dL (H)).   LIVER  Recent Labs Lab 06/02/17 1330  AST 21  ALT 16  ALKPHOS 100  BILITOT 0.4  PROT 7.7  ALBUMIN 3.7      INFECTIOUS  Recent Labs Lab 06/02/17 1933 06/02/17 1942 06/03/17 0401 06/03/17 1214 06/04/17 0227 06/06/17 0032  LATICACIDVEN  --  10.22*  --  3.2*  --  1.2  PROCALCITON <0.10  --  <0.10  --  <0.10  --      ENDOCRINE CBG (last 3)   Recent Labs  06/07/17 1632 06/07/17 2035 06/08/17 0739  GLUCAP 175* 201* 178*     No results found.    ASSESSMENT and PLAN  Subglottic tracheal stenosis - followed at [facility] with multiple interventions - Suspension microdirect laryngoscopy, tracheoscopy, balloon dilation and kenalog injection of subglottic stenosis tracheostomy tube exchange Severe asthma with acute exacerbation - improved.  Acute bronchitis  rec -  Off abx  Hold on systemic steroids for now  F/u CT neck pending  Likely need ENT f/u here v tx [facility] for further ENT eval    Other medical issues per [facility]   PCCM signing off please call back if needed.   FAMILY  - Updates: pt updated at length 9/17     Nickolas Madrid, NP 06/08/2017  9:16 AM Pager: [phone] or [phone]  Attending Note:  46 year old female with extensive tracheal disease and asthma presenting to PCCM with worsening wheezing.  Patient receives all her care in [place] where multiple surgical interventions were done.  On exam, wheezing only when aware that somebody is listening to her, when observed on her phone there were no audible wheezing.  I reviewed CXR myself, no acute disease noted, trach in place.  Discussed with PCCM-NP.  Wheezing: upper airway in origin, no pulmonary issues now.  - If continues to be an issue recommend ENT to see or transfer to [place]: treated  - No further steroids  - Bronchodilators as ordered  Trach status: no decannulation  Hypoxemia:  - On 28% trach collar mostly for humidification, no titration  PCCM will sign off, please cal back if needed  Patient seen and examined, agree with above note.  I dictated the care and orders written for this  patient under my direction.  Rush Farmer, [place]

## 2017-06-08 NOTE — Care Management Important Message (Signed)
Important Message  Patient Details  Name: [name] MRN: [id number] Date of Birth: Oct 09, 1970   Medicare Important Message Given:  Yes    Nathen May 06/08/2017, 10:46 AM

## 2017-06-08 NOTE — Progress Notes (Signed)
Physical Therapy Treatment Patient Details Name: [name] MRN: [id number] DOB: 1970-12-03 Today's Date: 06/08/2017    History of Present Illness This is a 46 year old aaf w/ known h/o asthma and tracheostomy dependence d/t subglottal stenosis.  Admitted due to SOB, cough and nasal discharge with suspected URI SIRS/sepsis.    PT Comments    Pt with slow progression towards goals this session secondary to dizziness. Pt only tolerated short ambulation distance and then required seated rest and WC to return to room. Oxygen sats 99% on 28% TC on 4L and BP at 102/62 mmHg. Upon return to supine BP elevated to 129/86 mmHg. RN present in room and aware. Feel pt will benefit from use of cane at d/c, and will continue gait training with use of cane as pt tolerates. Will continue to follow acutely to maximize functional mobility independence and safety.    Follow Up Recommendations  Other (comment) (Resumption of HH services )     Equipment Recommendations  Cane    Recommendations for Other Services       Precautions / Restrictions Precautions Precautions: Fall Restrictions Weight Bearing Restrictions: No    Mobility  Bed Mobility Overal bed mobility: Needs Assistance Bed Mobility: Supine to Sit;Sit to Supine     Supine to sit: Supervision Sit to supine: Supervision   General bed mobility comments: Supervision for safety   Transfers Overall transfer level: Needs assistance Equipment used: None Transfers: Sit to/from Stand Sit to Stand: Min guard         General transfer comment: Min guard for safety. Upon standing pt reporting dizziness and required seated rest before ambulation.   Ambulation/Gait Ambulation/Gait assistance: Min assist;Min guard Ambulation Distance (Feet): 30 Feet Assistive device: Straight cane Gait Pattern/deviations: Step-through pattern;Decreased stride length;Antalgic;Trendelenburg Gait velocity: Decreased Gait velocity  interpretation: Below normal speed for age/gender General Gait Details: R hip weakness with ambulation secondary to pt reports of bulging disk. Pt required cues for use of cane during ambulation. Upon ambulation, pt reporting dizziness. Required standing WC and assit from tech to maintain balance. RN got WC and pt required WC to return to room. BP checked and at 102/62 mmHg. Upon return to supine, BP elevated to 129/86 mmHg. Pt oxygen sats maintained at 99% on 4L through trach collar.    Stairs            Wheelchair Mobility    Modified Rankin (Stroke Patients Only)       Balance Overall balance assessment: Needs assistance Sitting-balance support: No upper extremity supported;Feet supported Sitting balance-[name]: Good     Standing balance support: Single extremity supported;During functional activity Standing balance-[name]: Fair Standing balance comment: Able to maintain static standing without UE support.                             Cognition Arousal/Alertness: Awake/alert Behavior During Therapy: WFL for tasks assessed/performed Overall Cognitive Status: Within Functional Limits for tasks assessed                                        Exercises      General Comments General comments (skin integrity, edema, etc.): RN aware of dizziness and decreased BP and present at end of session. 28% TC during ambulation.       Pertinent Vitals/Pain Pain Assessment: 0-10 Pain Score: 9  Pain Location: neck at trach site  Pain Descriptors / Indicators: Discomfort;Grimacing;Sore Pain Intervention(s): Limited activity within patient's tolerance;Monitored during session;Repositioned    Home Living                      Prior Function            PT Goals (current goals can now be found in the care plan section) Acute Rehab PT Goals Patient Stated Goal: To return to independent PT Goal Formulation: With patient Time For Goal  Achievement: 06/11/17 Progress towards PT goals: Progressing toward goals    Frequency    Min 3X/week      PT Plan Discharge plan needs to be updated;Equipment recommendations need to be updated    Co-evaluation              AM-PAC PT "6 Clicks" Daily Activity  Outcome Measure  Difficulty turning over in bed (including adjusting bedclothes, sheets and blankets)?: None Difficulty moving from lying on back to sitting on the side of the bed? : A Little Difficulty sitting down on and standing up from a chair with arms (e.g., wheelchair, bedside commode, etc,.)?: Unable Help needed moving to and from a bed to chair (including a wheelchair)?: A Little Help needed walking in hospital room?: A Little Help needed climbing 3-5 steps with a railing? : A Little 6 Click Score: 17    End of Session Equipment Utilized During Treatment: Gait belt;Oxygen Activity Tolerance: Treatment limited secondary to medical complications (Comment) (dizziness; decreased BP ) Patient left: in bed;with call bell/phone within reach;with nursing/sitter in room Nurse Communication: Mobility status;Other (comment) (dizziness ) PT Visit Diagnosis: Other abnormalities of gait and mobility (R26.89)     Time: 1497-0263 PT Time Calculation (min) (ACUTE ONLY): 34 min  Charges:  $Gait Training: 23-37 mins                    G Codes:       Leighton Ruff, PT, DPT  Acute Rehabilitation Services  Pager: [phone]    Rudean Hitt 06/08/2017, 1:04 PM

## 2017-06-08 NOTE — Progress Notes (Signed)
PROGRESS NOTE    Taylorann Tkach [name]  EXB:[id number] DOB: Jan 11, 1971 DOA: 06/02/2017 PCP: Arnoldo Morale, MD    Brief Narrative:  46 year old female who presented with dyspnea. Patient is known to have significant history for asthma, obstructive sleep apnea, hypertension, diabetes mellitus type 2, and morbid obesity. Patient had recent tracheostomy July 2017, due to respiratory failure, diagnosed with subglottic stenosis. She had several days of increased nasal drainage, and upper respiratory tract infection symptoms, evolving into dyspnea which was refractory to bronchodilator use at home. Initial physical examination, blood pressure 148/79, heart rate 110, respiratory 22 to 40, oxygen saturation 98 to 100% on supplemental oxygen. Moist mucous membranes, neck with tracheostomy in place, lungs had decreased breath sounds bilaterally and coarse expiratory wheezing, heart S1-S2 present rhythmic, abdomen was protuberant, soft nontender, no lower extremity edema. Sodium 138, potassium 3.1, chloride 105, bicarbonate 18, glucose 220, BUN 11, creatinine 1.14, white count 12.1, hemoglobin 11.0, hematocrit 33.2, platelets 424, venous lactic acid 6.2, urinalysis negative for infection, EKG with sinus tachycardia, normal intervals, normal axis. Chest film, not rotated, well penetrated, hypo-inflated, no effusions, infiltrates or pneumothorax. CT chest negative for pulmonary embolus, no infiltrates.  Patient admitted to the hospital with working diagnosis of acute hypoxic respiratory failure due to asthma exacerbation.   Asthma clinically improving, now with severe and persistent pain at the trach site with radiation into the chest and back.   Assessment & Plan:   Principal Problem:   Acute respiratory distress Active Problems:   Sleep apnea, obstructive   Acute bronchitis   Essential hypertension   Type 2 diabetes mellitus with diabetic polyneuropathy, without long-term current use of insulin  (HCC)   Morbid obesity (HCC)   Chronic pain syndrome   Severe asthma with acute exacerbation   Generalized anxiety disorder   Tracheal stenosis   Tracheostomy status (HCC)   Hypokalemia   Elevated lactic acid level   1. Asthma exacerbation with acute hypoxemic respiratory failure and bronchitis.Pain with mild improvement, will continue current medical therapy with narcotics and muscle relaxants, will have to hold on ibuprofen due elevation in serum cr. Will continue neuro checks. No wheezing, and cough has improved, will continue with bronchodilator therapy, anti-tussive therapy, oxymetry monitoring and supplemental oxygen per trach collar.   2. Subglottic stenosis. Patient follows up with Dr. Donald Pore, at [facility], 08/27 underwent: Suspension microdirect laryngoscopy, tracheoscopy, balloon dilation and kenalog injection of subglottic stenosis tracheostomy tube exchange. Plan for follow up neck CT and possible repeat dilatation and likely laser excision of subglottic scarring, if no progress may consider tracheal resection. Stable tracheostomy, continue routine care.   3. T2DM. Uncontrolled hyperglycemia. Tolerating po well,  continue with insulin sliding scale, for glucose cover and monitoring, capillary glucose 185, 175, 201, 178, 169.   4. HTN. Onamlodipine,carvedilol. Blood pressure well controlled.   5. AKI on CKD stage 2 with hyperkalemia.Patient with edema at the site of IV, fluids were discontinued, will hold on ibuprofen, serum cr at 1, 69, old records personally reviewed her cr has been as high as 1,41 in 02/2017.   6. Anemia, multifactorial. No bleeding.    7. Depression. Continue with sertraline and topiramate.Suspected pain might be exacerbated by depression.   DVT prophylaxis:enoxaparin Code Status:full Family Communication:I spoke with patient's family at the bedside and all questions were addressed.  Disposition Plan:Home     Consultants:  Pulmonary  Procedures:    Antimicrobials:   Subjective: Patient with improved pain but still persistent moderate and at times severe,  sharp, no radiation, worse with movement, no associated nausea or vomiting. Cough improved./   Objective: Vitals:   06/07/17 2118 06/07/17 2344 06/08/17 0319 06/08/17 0537  BP: 106/64   118/60  Pulse:  79 72 78  Resp:  16 18 17   Temp:    98.3 F (36.8 C)  TempSrc:    Oral  SpO2:  100% 100% 99%  Weight:      Height:        Intake/Output Summary (Last 24 hours) at 06/08/17 0758 Last data filed at 06/07/17 1746  Gross per 24 hour  Intake           634.17 ml  Output                0 ml  Net           634.17 ml   Filed Weights   06/04/17 0514 06/05/17 0431 06/07/17 0525  Weight: 105.1 kg (231 lb 12.8 oz) 106.5 kg (234 lb 12.8 oz) 112.5 kg (247 lb 14.7 oz)    Examination:  General: deconditioned Neurology: Awake and alert, non focal  E ENT: mild pallor, no icterus, oral mucosa moist, trach in place.  Cardiovascular: No JVD. S1-S2 present, rhythmic, no gallops, rubs, or murmurs. No lower extremity edema. Pulmonary: vesicular breath sounds bilaterally, adequate air movement, no wheezing, rhonchi or rales. Gastrointestinal. Abdomen prtuberant, no organomegaly, non tender, no rebound or guarding Skin. No rashes Musculoskeletal: no joint deformities, pain on palpation of the chest wall     Data Reviewed: I have personally reviewed following labs and imaging studies  CBC:  Recent Labs Lab 06/02/17 1330 06/03/17 0401 06/04/17 0227 06/05/17 0236  WBC 12.1* 14.4* 18.9* 12.3*  NEUTROABS 5.2  --  16.0* 7.1  HGB 11.0* 9.5* 9.2* 9.2*  HCT 33.2* 30.0* 29.7* 29.1*  MCV 83.4 83.8 85.6 86.4  PLT 424* 381 356 412   Basic Metabolic Panel:  Recent Labs Lab 06/03/17 0401 06/04/17 0227 06/05/17 0236 06/06/17 0032 06/07/17 0423 06/08/17 0150  NA 136 135 136 137  --  137  K 5.0 4.6 3.6 3.8  --  3.9  CL 109  106 107 108  --  107  CO2 19* 23 25 22   --  26  GLUCOSE 324* 322* 184* 258*  --  212*  BUN 11 22* 20 18  --  21*  CREATININE 1.12* 1.24* 1.29* 1.16*  --  1.38*  CALCIUM 8.9 9.0 8.6* 8.1*  --  8.4*  MG  --   --   --  1.9 1.9 2.0  PHOS  --   --   --  4.4 4.4 4.1   GFR: Estimated Creatinine Clearance: 58.6 mL/min (A) (by C-G formula based on SCr of 1.38 mg/dL (H)). Liver Function Tests:  Recent Labs Lab 06/02/17 1330  AST 21  ALT 16  ALKPHOS 100  BILITOT 0.4  PROT 7.7  ALBUMIN 3.7   No results for input(s): LIPASE, AMYLASE in the last 168 hours. No results for input(s): AMMONIA in the last 168 hours. Coagulation Profile: No results for input(s): INR, PROTIME in the last 168 hours. Cardiac Enzymes: No results for input(s): CKTOTAL, CKMB, CKMBINDEX, TROPONINI in the last 168 hours. BNP (last 3 results) No results for input(s): PROBNP in the last 8760 hours. HbA1C: No results for input(s): HGBA1C in the last 72 hours. CBG:  Recent Labs Lab 06/07/17 0810 06/07/17 1151 06/07/17 1632 06/07/17 2035 06/08/17 0739  GLUCAP 135* 185* 175* 201* 178*  Lipid Profile: No results for input(s): CHOL, HDL, LDLCALC, TRIG, CHOLHDL, LDLDIRECT in the last 72 hours. Thyroid Function Tests: No results for input(s): TSH, T4TOTAL, FREET4, T3FREE, THYROIDAB in the last 72 hours. Anemia Panel: No results for input(s): VITAMINB12, FOLATE, FERRITIN, TIBC, IRON, RETICCTPCT in the last 72 hours.    Radiology Studies: I have reviewed all of the imaging during this hospital visit personally     Scheduled Meds: . amLODipine  10 mg Oral Daily  . atorvastatin  20 mg Oral Daily  . budesonide (PULMICORT) nebulizer solution  0.5 mg Nebulization BID  . carvedilol  6.25 mg Oral BID WC  . chlorhexidine  15 mL Mouth Rinse BID  . chlorhexidine gluconate (MEDLINE KIT)  15 mL Mouth Rinse BID  . cyclobenzaprine  5 mg Oral TID  . enoxaparin (LOVENOX) injection  40 mg Subcutaneous Q24H  . fluticasone   2 spray Each Nare BID  . gabapentin  300 mg Oral TID  . guaiFENesin-dextromethorphan  10 mL Oral Q6H  . insulin aspart  0-20 Units Subcutaneous TID WC  . insulin aspart  0-5 Units Subcutaneous QHS  . insulin glargine  25 Units Subcutaneous QHS  . ipratropium  2 spray Each Nare TID  . ipratropium-albuterol  3 mL Nebulization BID  . mouth rinse  15 mL Mouth Rinse q12n4p  . oxyCODONE  10 mg Oral Q12H  . pantoprazole  40 mg Oral Daily  . sertraline  100 mg Per Tube Daily  . sucralfate  1 g Oral TID WC & HS  . topiramate  100 mg Oral BID   Continuous Infusions:   LOS: 6 days        Lavanda Nevels Gerome Apley, MD [facility] Pager [phone]

## 2017-06-08 NOTE — Progress Notes (Signed)
Pt seen for trach team follow up. Trach clinic information given to husband. All necessary equipment at bedside.Will continue to follow for progression.

## 2017-06-09 DIAGNOSIS — N182 Chronic kidney disease, stage 2 (mild): Secondary | ICD-10-CM

## 2017-06-09 DIAGNOSIS — N179 Acute kidney failure, unspecified: Secondary | ICD-10-CM

## 2017-06-09 LAB — GLUCOSE, CAPILLARY: GLUCOSE-CAPILLARY: 240 mg/dL — AB (ref 65–99)

## 2017-06-09 LAB — BASIC METABOLIC PANEL
Anion gap: 4 — ABNORMAL LOW (ref 5–15)
BUN: 15 mg/dL (ref 6–20)
CO2: 25 mmol/L (ref 22–32)
Calcium: 8.3 mg/dL — ABNORMAL LOW (ref 8.9–10.3)
Chloride: 107 mmol/L (ref 101–111)
Creatinine, Ser: 1.25 mg/dL — ABNORMAL HIGH (ref 0.44–1.00)
GFR, EST AFRICAN AMERICAN: 58 mL/min — AB (ref >60)
GFR, EST NON AFRICAN AMERICAN: 50 mL/min — AB (ref >60)
Glucose, Bld: 228 mg/dL — ABNORMAL HIGH (ref 65–99)
POTASSIUM: 3.5 mmol/L (ref 3.5–5.1)
SODIUM: 136 mmol/L (ref 135–145)

## 2017-06-09 LAB — PHOSPHORUS: PHOSPHORUS: 3.1 mg/dL (ref 2.5–4.6)

## 2017-06-09 LAB — MAGNESIUM: Magnesium: 2 mg/dL (ref 1.7–2.4)

## 2017-06-09 MED ORDER — OXYCODONE-ACETAMINOPHEN 5-325 MG PO TABS: 1.0000 | ORAL_TABLET | ORAL | Status: DC | PRN

## 2017-06-09 MED ORDER — OXYCODONE-ACETAMINOPHEN 5-325 MG PO TABS: 1.0000 | ORAL_TABLET | Freq: Four times a day (QID) | ORAL | 0 refills | Status: DC | PRN

## 2017-06-09 NOTE — Progress Notes (Signed)
Tracheal aspirate obtained and sent to lab.

## 2017-06-09 NOTE — Progress Notes (Signed)
Inpatient Diabetes Program Recommendations  AACE/ADA: New Consensus Statement on Inpatient Glycemic Control (2015)  Target Ranges:  Prepandial:   less than 140 mg/dL      Peak postprandial:   less than 180 mg/dL (1-2 hours)      Critically ill patients:  140 - 180 mg/dL   Results for [name], [name] (MRN [id number]) as of 06/09/2017 10:06  Ref. Range 06/08/2017 07:39 06/08/2017 11:01 06/08/2017 16:43 06/08/2017 21:47 06/09/2017 06:22  Glucose-Capillary Latest Ref Range: 65 - 99 mg/dL 178 (H) 169 (H) 158 (H) 211 (H) 240 (H)   Review of Glycemic Control  Diabetes history: DM2 Outpatient Diabetes medications: Lantus 25 units QHS, Humalog 8 units TID with meals, Metformin XR 500 mg QAM Current orders for Inpatient glycemic control: Lantus 25 units QHS, Novolog 0-20 units TID with meals, Novolog 0-5 units QHS  Inpatient Diabetes Program Recommendations: Insulin - Basal: Please consider increasing Lantus to 27 units QHS. Insulin - Meal Coverage: Post prandial glucose is consistently elevated. Please consider ordering Novolog 3 units TID with meals for meal coverage if patient eats at least 50% of meals.  Thanks, Barnie Alderman, RN, MSN, CDE Diabetes Coordinator Inpatient Diabetes Program [phone] (Team Pager from 8am to 5pm)

## 2017-06-09 NOTE — Progress Notes (Signed)
Patient received discharge information and acknowledged understanding of it. RN answered all questions that were asked. RN verified with Dr. Cathlean Sauer that patient's trach cuff needs to be deflated and RN checked the cuff and verified with RT prior to release that patient's trach cuff was deflated. Patient IV was removed.

## 2017-06-09 NOTE — Progress Notes (Signed)
Sutures removed per order from Dr. Cathlean Sauer.  Bottom right suture site of trach noted to be crusty with slight bleeding.  Pt indicates she feels ok without issue at this time.

## 2017-06-09 NOTE — Plan of Care (Signed)
Problem: Pain Managment: Goal: General experience of comfort will improve Outcome: Progressing Patient continues to have pain in neck/face. Medication given per eMAR. Patient had relief after PRN Morphine.   Problem: Physical Regulation: Goal: Ability to maintain clinical measurements within normal limits will improve Outcome: Progressing Patient had tan/green sputum within cannula when changed by husband. They both noted a different order coming from the cannula. Sputum culture sent. Patient running low temp this evening. Tylenol and cool packs provided.

## 2017-06-09 NOTE — Discharge Summary (Signed)
Physician Discharge Summary  [name] KTG:[id number] DOB: [birth year] DOA: 06/02/2017  PCP: Arnoldo Morale, MD  Admit date: 06/02/2017 Discharge date: 06/09/2017  Admitted From: Home  Disposition:  Home  Recommendations for Outpatient Follow-up:  1. Follow up with PCP in 1- weeks 2. Patient will need prompt follow up with Dr. Donald Pore at [facility], subglottic stenosis 3. Holding ibuprofen due to risk of worsening renal function   Home Health: Yes (resume)  Equipment/Devices: Trach care   Discharge Condition: Stable CODE STATUS: Full  Diet recommendation: Cardiac and diabetic prudent  Brief/Interim Summary: 46 year old female who presented with dyspnea. Patient is known to have significant history for asthma, obstructive sleep apnea, hypertension, diabetes mellitus type 2, and morbid obesity. Patient had recent tracheostomy July 2017, due to respiratory failure, diagnosed with subglottic stenosis. She had several days of increased nasal drainage, and upper respiratory tract infection symptoms, evolving into dyspnea which was refractory to bronchodilator use at home. Initial physical examination, blood pressure 148/79, heart rate 110, respiratory rate 22 to 40, oxygen saturation 98 to 100% on supplemental oxygen. Moist mucous membranes, neck with tracheostomy in place, lungs had decreased breath sounds bilaterally and coarse expiratory wheezing, heart S1-S2 present rhythmic, abdomen was protuberant, soft nontender, no lower extremity edema. Sodium 138, potassium 3.1, chloride 105, bicarbonate 18, glucose 220, BUN 11, creatinine 1.14, white count 12.1, hemoglobin 11.0, hematocrit 33.2, platelets 424, venous lactic acid 6.2, urinalysis negative for infection, EKG with sinus tachycardia, normal intervals, normal axis. Chest film, not rotated, well penetrated, hypo-inflated, no effusions, infiltrates or pneumothorax. CT chest negative for pulmonary embolus, no  infiltrates.  Patient admitted to the hospital with working diagnosis of acute hypoxic respiratory failure due to asthma exacerbation.   1. Acute asthma exacerbation/ bronchitis, complicated by acute on chronic hypoxic respiratory failure. Patient was admitted to the medical unit, she was placed on systemic steroids and aggressive bronchodilator therapy. Supplemental oxygen per trach collar. She responded well to the medical therapy, steroids were discontinued. Patient had significant pain at the trach site, along with musculoskeletal chest pain that delay her recovery. She was placed on analgesics and muscle relaxants as well as antitussive agents with improvement of her symptoms. She was deemed stable for discharge on September 18, resume home health services. Continue home bronchodilator therapy and inhaled corticosteroids, including Advair.  2. Subglottic stenosis. Patient follows up with Dr. Donald Pore, at [facility], 08/27 underwent: Suspension microdirect laryngoscopy, tracheoscopy, balloon dilation and kenalog injection of subglottic stenosis tracheostomy tube exchange. Plan for follow up neck CT and possible repeat dilatation and likely laser excision of subglottic scarring, if no progress Dr. Donald Pore mentions to consider tracheal resection. On this hospitalization, sutures at trach site were removed, no significant secretions. Recommend promt follow-up with Dr. Donald Pore.   3. Type 2 diabetes mellitus. Patient experienced hyperglycemia, steroid induced, she received insulin sliding-scale for glucose coverage and monitoring, she had rapid taper of systemic steroids. Capillary glucose 178, 169, 158, 211, 240, over last 24 hours. Will resume Dulaglutide, Insulin glargine, metformin at discharge.   4. Hypertension. Patient was continued on amlodipine and carvedilol with good blood pressure control.  5. Acute kidney injury on chronic kidney disease stage II, with hyperkalemia. Patient received  crystalloids intravenously, nephrotoxic agents were avoided, her peak creatinine reach 1.38, the time of discharge is around 1.25, will recommend to avoid NSAIDs. Discharge potassium 3.5.   6. Multifactorial anemia. Stable.  7. Depression. Patient will continue with sertraline, tizanidine and topiramate.   Discharge Diagnoses:  Principal Problem:   Acute respiratory distress Active Problems:   Sleep apnea, obstructive   Acute bronchitis   Essential hypertension   Type 2 diabetes mellitus with diabetic polyneuropathy, without long-term current use of insulin (HCC)   Morbid obesity (HCC)   Chronic pain syndrome   Severe asthma with acute exacerbation   Generalized anxiety disorder   Tracheal stenosis   Tracheostomy status (HCC)   Hypokalemia   Elevated lactic acid level    Discharge Instructions  Discharge Instructions    AMB Referral to [facility]    Complete by:  As directed    Reason for consult:  Multiple admissions - Trach dependent   Diagnoses of:   COPD/ Pneumonia Diabetes     Expected date of contact:  1-3 days (reserved for hospital discharges)   Patient is trach dependent, she has [facility] at home for home health; Adv home care for supplies; [facility]. Husband, Juanda Crumble is the contact person, [phone]. Please assign to community nurse for transition of care calls and assess for home visits. Questions please call:   Natividad Brood, RN BSN [facility] Liaison  [phone] business mobile phone Toll free office [phone]     Allergies as of 06/09/2017      Reactions   Reglan [metoclopramide] Other (See Comments)   Panic attack      Medication List    STOP taking these medications   ibuprofen 200 MG tablet Commonly known as:  MOTRIN IB     TAKE these medications   ACCU-CHEK AVIVA PLUS w/Device Kit Used to check blood sugars   albuterol (2.5 MG/3ML) 0.083% nebulizer solution Commonly known as:   PROVENTIL Take 3 mLs (2.5 mg total) by nebulization every 6 (six) hours as needed for wheezing or shortness of breath. Reported on 02/22/2016   albuterol 108 (90 Base) MCG/ACT inhaler Commonly known as:  PROVENTIL HFA;VENTOLIN HFA Inhale 2 puffs into the lungs every 6 (six) hours as needed for wheezing or shortness of breath.   amLODipine 10 MG tablet Commonly known as:  NORVASC Take 1 tablet (10 mg total) by mouth daily.   artificial tears ointment Place 1 application into both eyes at bedtime.   atorvastatin 20 MG tablet Commonly known as:  LIPITOR Take 1 tablet (20 mg total) by mouth daily.   carvedilol 6.25 MG tablet Commonly known as:  COREG Take 1 tablet (6.25 mg total) by mouth 2 (two) times daily with a meal.   chlorhexidine gluconate (MEDLINE KIT) 0.12 % solution Commonly known as:  PERIDEX 15 mLs by Mouth Rinse route 2 (two) times daily.   chlorpheniramine-HYDROcodone 10-8 MG/5ML Suer Commonly known as:  TUSSIONEX PENNKINETIC ER Take 5 mLs by mouth every 12 (twelve) hours as needed for cough.   Dulaglutide 1.5 MG/0.5ML Sopn Inject 1.5 mg into the skin once a week.   Fluticasone-Salmeterol 250-50 MCG/DOSE Aepb Commonly known as:  ADVAIR DISKUS Inhale 1 puff into the lungs 2 (two) times daily.   gabapentin 300 MG capsule Commonly known as:  NEURONTIN Take 1 capsule (300 mg total) by mouth 3 (three) times daily.   glucose blood test strip Commonly known as:  ACCU-CHEK AVIVA 1 each by Other route daily. And lancets 1/day   guaiFENesin-dextromethorphan 100-10 MG/5ML syrup Commonly known as:  ROBITUSSIN DM Take 10 mLs by mouth every 4 (four) hours as needed for cough.   insulin lispro 100 UNIT/ML injection Commonly known as:  HUMALOG Inject 8 Units into the skin 3 (  three) times daily with meals.   LANTUS SOLOSTAR 100 UNIT/ML Solostar Pen Generic drug:  Insulin Glargine Inject 25 Units into the skin at bedtime.   metFORMIN 500 MG 24 hr tablet Commonly known  as:  GLUCOPHAGE-XR Take 1 tablet (500 mg total) by mouth daily with breakfast.   methocarbamol 500 MG tablet Commonly known as:  ROBAXIN Take 500 mg by mouth every 8 (eight) hours as needed for muscle spasms.   NOVOLOG FLEXPEN 100 UNIT/ML FlexPen Generic drug:  insulin aspart as directed.   oxyCODONE-acetaminophen 5-325 MG tablet Commonly known as:  PERCOCET/ROXICET Take 1 tablet by mouth every 6 (six) hours as needed for severe pain.   pantoprazole 40 MG tablet Commonly known as:  PROTONIX Take 40 mg by mouth daily.   sertraline 100 MG tablet Commonly known as:  ZOLOFT Place 1 tablet (100 mg total) into feeding tube daily.   sodium chloride HYPERTONIC 3 % nebulizer solution Take 4 mLs by nebulization 2 (two) times daily.   SUMAtriptan 100 MG tablet Commonly known as:  IMITREX Take 100 mg by mouth daily as needed. For migraine   tiZANidine 4 MG tablet Commonly known as:  ZANAFLEX Take 1 tablet (4 mg total) by mouth every 8 (eight) hours as needed for muscle spasms.   topiramate 100 MG tablet Commonly known as:  TOPAMAX Take 1 tablet (100 mg total) by mouth 2 (two) times daily.            Discharge Care Instructions        Start     Ordered   06/09/17 0000  Increase activity slowly     06/09/17 0901   06/09/17 0000  Diet - low sodium heart healthy     06/09/17 0901   06/09/17 0000  Discharge instructions    Comments:  Please follow with primary care in 7 days.   06/09/17 0901   06/09/17 0000  oxyCODONE-acetaminophen (PERCOCET/ROXICET) 5-325 MG tablet  Every 6 hours PRN     06/09/17 0901   06/09/17 0000  Home Health  (Home health needs / face to face )    Question Answer Comment  To provide the following care/treatments PT   To provide the following care/treatments RN   To provide the following care/treatments Respiratory Care      06/09/17 0901   06/09/17 0000  Face-to-face encounter (required for Medicare/Medicaid patients)  (Home health needs / face to  face )    Comments:  I Jimmy Picket Ezio Wieck certify that this patient is under my care and that I, or a nurse practitioner or physician's assistant working with me, had a face-to-face encounter that meets the physician face-to-face encounter requirements with this patient on 06/09/2017. The encounter with the patient was in whole, or in part for the following medical condition(s) which is the primary reason for home health care (List medical condition): Bronchitis with chronic respiratory failure.  Question Answer Comment  The encounter with the patient was in whole, or in part, for the following medical condition, which is the primary reason for home health care bronchitis/ chronic respiratory failure   I certify that, based on my findings, the following services are medically necessary home health services Nursing   I certify that, based on my findings, the following services are medically necessary home health services Physical therapy   Reason for Medically [facility]- Teaching of Disease Process/Symptom Management   Reason for Medically Necessary Home Health Services Therapy- Therapeutic Exercises  to Increase Strength and Endurance   My clinical findings support the need for the above services Unable to leave home safely without assistance and/or assistive device   Further, I certify that my clinical findings support that this patient is homebound due to: Unable to leave home safely without assistance      06/09/17 0901   06/04/17 0000  AMB Referral to [facility]    Question Answer Comment  Reason for consult Multiple admissions - Trach dependent   Diagnoses of COPD/ Pneumonia   Diagnoses of Diabetes   Expected date of contact 1-3 days (reserved for hospital discharges)      06/04/17 1728      Allergies  Allergen Reactions  . Reglan [Metoclopramide] Other (See Comments)    Panic attack    Consultations:  Pulmonary     Procedures/Studies: Ct Angio Chest Pe W Or Wo Contrast  Result Date: 06/02/2017 CLINICAL DATA:  Short of breath chest tightness.  Suspect PE EXAM: CT ANGIOGRAPHY CHEST WITH CONTRAST TECHNIQUE: Multidetector CT imaging of the chest was performed using the standard protocol during bolus administration of intravenous contrast. Multiplanar CT image reconstructions and MIPs were obtained to evaluate the vascular anatomy. CONTRAST:  80 mL Isovue 370 IV COMPARISON:  Chest x-ray 06/02/2017.  Chest CT 05/06/2017 FINDINGS: Image quality degraded by patient motion and large patient size. Cardiovascular: Negative for pulmonary embolism. Negative for aortic aneurysm or dissection mild cardiac enlargement. Mediastinum/Nodes: Right lower peritracheal lymph node 19 mm unchanged from the prior study. Otherwise no adenopathy. Tracheostomy tube in place. Lungs/Pleura: Mild atelectasis in the lung bases. Negative for pneumonia or effusion Upper Abdomen: Fatty infiltration of the liver. Musculoskeletal: No acute skeletal abnormality Review of the MIP images confirms the above findings. IMPRESSION: Negative for pulmonary embolism. Mild bibasilar atelectasis 19 mm right lower peritracheal lymph node unchanged from the recent study. Electronically Signed   By: Franchot Gallo M.D.   On: 06/02/2017 19:01   Dg Chest Port 1 View  Result Date: 06/02/2017 CLINICAL DATA:  Severe shortness of breath. EXAM: PORTABLE CHEST 1 VIEW COMPARISON:  Chest x-ray dated May 06, 2017. FINDINGS: Unchanged tracheostomy tube with the tip at the thoracic inlet. The cardiomediastinal silhouette is at the upper limits of normal in size. Low lung volumes are present, causing crowding of the pulmonary vasculature. No focal consolidation, pleural effusion, or pneumothorax. No acute osseous abnormality. IMPRESSION: No active disease. Electronically Signed   By: Titus Dubin M.D.   On: 06/02/2017 13:28       Subjective: Patient feeling better,  pain at the chest wall improved and more tolerable, ambulated yesterday. No nausea or vomiting, no dyspnea.   Discharge Exam: Vitals:   06/09/17 0525 06/09/17 0815  BP: 119/74   Pulse: 92 89  Resp:  18  Temp: 99.2 F (37.3 C)   SpO2: 97% 99%   Vitals:   06/09/17 0000 06/09/17 0500 06/09/17 0525 06/09/17 0815  BP:   119/74   Pulse:  94 92 89  Resp: 16 19  18   Temp:   99.2 F (37.3 C)   TempSrc:   Oral   SpO2:   97% 99%  Weight:   111.3 kg (245 lb 6.4 oz)   Height:   5' 1"  (1.549 m)     General: Pt is alert, awake, not in acute distress E ENT. Mild pallor, trach in place.  Cardiovascular: RRR, S1/S2 +, no rubs, no gallops Respiratory: CTA bilaterally, no wheezing, no rhonchi Abdominal: Soft,  protuberant NT, ND, bowel sounds + Extremities: no edema, no cyanosis    The results of significant diagnostics from this hospitalization (including imaging, microbiology, ancillary and laboratory) are listed below for reference.     Microbiology: Recent Results (from the past 240 hour(s))  Blood Culture (routine x 2)     Status: Abnormal   Collection Time: 06/02/17  1:32 PM  Result Value Ref Range Status   Specimen Description BLOOD LEFT ANTECUBITAL  Final   Special Requests   Final    BOTTLES DRAWN AEROBIC AND ANAEROBIC Blood Culture adequate volume   Culture  Setup Time   Final    GRAM POSITIVE COCCI ANAEROBIC BOTTLE ONLY CRITICAL RESULT CALLED TO, READ BACK BY AND VERIFIED WITH: PHARMD M TURNER [phone]    Culture (A)  Final    STAPHYLOCOCCUS SPECIES (COAGULASE NEGATIVE) THE SIGNIFICANCE OF ISOLATING THIS ORGANISM FROM A SINGLE SET OF BLOOD CULTURES WHEN MULTIPLE SETS ARE DRAWN IS UNCERTAIN. PLEASE NOTIFY THE MICROBIOLOGY DEPARTMENT WITHIN ONE WEEK IF SPECIATION AND SENSITIVITIES ARE REQUIRED.    Report Status 06/04/2017 FINAL  Final  Blood Culture ID Panel (Reflexed)     Status: Abnormal   Collection Time: 06/02/17  1:32 PM  Result Value Ref Range Status    Enterococcus species NOT DETECTED NOT DETECTED Final   Vancomycin resistance NOT DETECTED NOT DETECTED Final   Listeria monocytogenes NOT DETECTED NOT DETECTED Final   Staphylococcus species DETECTED (A) NOT DETECTED Final    Comment: CRITICAL RESULT CALLED TO, READ BACK BY AND VERIFIED WITH: PHARMD M TURNER [phone] MLM    Staphylococcus aureus NOT DETECTED NOT DETECTED Final   Methicillin resistance DETECTED (A) NOT DETECTED Final    Comment: CRITICAL RESULT CALLED TO, READ BACK BY AND VERIFIED WITH: PHARMD M TURNER [phone] MLM    Streptococcus species NOT DETECTED NOT DETECTED Final   Streptococcus agalactiae NOT DETECTED NOT DETECTED Final   Streptococcus pneumoniae NOT DETECTED NOT DETECTED Final   Streptococcus pyogenes NOT DETECTED NOT DETECTED Final   Acinetobacter baumannii NOT DETECTED NOT DETECTED Final   Enterobacteriaceae species NOT DETECTED NOT DETECTED Final   Enterobacter cloacae complex NOT DETECTED NOT DETECTED Final   Escherichia coli NOT DETECTED NOT DETECTED Final   Klebsiella oxytoca NOT DETECTED NOT DETECTED Final   Klebsiella pneumoniae NOT DETECTED NOT DETECTED Final   Proteus species NOT DETECTED NOT DETECTED Final   Serratia marcescens NOT DETECTED NOT DETECTED Final   Carbapenem resistance NOT DETECTED NOT DETECTED Final   Haemophilus influenzae NOT DETECTED NOT DETECTED Final   Neisseria meningitidis NOT DETECTED NOT DETECTED Final   Pseudomonas aeruginosa NOT DETECTED NOT DETECTED Final   Candida albicans NOT DETECTED NOT DETECTED Final   Candida glabrata NOT DETECTED NOT DETECTED Final   Candida krusei NOT DETECTED NOT DETECTED Final   Candida parapsilosis NOT DETECTED NOT DETECTED Final   Candida tropicalis NOT DETECTED NOT DETECTED Final  Blood Culture (routine x 2)     Status: None   Collection Time: 06/02/17  1:50 PM  Result Value Ref Range Status   Specimen Description BLOOD RIGHT HAND  Final   Special Requests   Final    BOTTLES DRAWN  AEROBIC AND ANAEROBIC Blood Culture adequate volume   Culture NO GROWTH 5 DAYS  Final   Report Status 06/07/2017 FINAL  Final  Culture, blood (routine x 2)     Status: None   Collection Time: 06/03/17  4:09 PM  Result Value Ref Range Status  Specimen Description BLOOD RIGHT ARM  Final   Special Requests   Final    BOTTLES DRAWN AEROBIC AND ANAEROBIC Blood Culture adequate volume   Culture NO GROWTH 5 DAYS  Final   Report Status 06/08/2017 FINAL  Final  Culture, blood (routine x 2)     Status: None   Collection Time: 06/03/17  4:09 PM  Result Value Ref Range Status   Specimen Description BLOOD LEFT ANTECUBITAL  Final   Special Requests   Final    BOTTLES DRAWN AEROBIC AND ANAEROBIC Blood Culture adequate volume   Culture NO GROWTH 5 DAYS  Final   Report Status 06/08/2017 FINAL  Final  Culture, respiratory (NON-Expectorated)     Status: None (Preliminary result)   Collection Time: 06/08/17  9:32 PM  Result Value Ref Range Status   Specimen Description TRACHEAL ASPIRATE  Final   Special Requests NONE  Final   Gram Stain   Final    ABUNDANT WBC PRESENT,BOTH PMN AND MONONUCLEAR RARE GRAM NEGATIVE RODS    Culture PENDING  Incomplete   Report Status PENDING  Incomplete     Labs: BNP (last 3 results)  Recent Labs  02/19/17 0219 06/02/17 1933  BNP 54.8 35.4   Basic Metabolic Panel:  Recent Labs Lab 06/04/17 0227 06/05/17 0236 06/06/17 0032 06/07/17 0423 06/08/17 0150 06/09/17 0625  NA 135 136 137  --  137 136  K 4.6 3.6 3.8  --  3.9 3.5  CL 106 107 108  --  107 107  CO2 23 25 22   --  26 25  GLUCOSE 322* 184* 258*  --  212* 228*  BUN 22* 20 18  --  21* 15  CREATININE 1.24* 1.29* 1.16*  --  1.38* 1.25*  CALCIUM 9.0 8.6* 8.1*  --  8.4* 8.3*  MG  --   --  1.9 1.9 2.0 2.0  PHOS  --   --  4.4 4.4 4.1 3.1   Liver Function Tests:  Recent Labs Lab 06/02/17 1330  AST 21  ALT 16  ALKPHOS 100  BILITOT 0.4  PROT 7.7  ALBUMIN 3.7   No results for input(s): LIPASE,  AMYLASE in the last 168 hours. No results for input(s): AMMONIA in the last 168 hours. CBC:  Recent Labs Lab 06/02/17 1330 06/03/17 0401 06/04/17 0227 06/05/17 0236  WBC 12.1* 14.4* 18.9* 12.3*  NEUTROABS 5.2  --  16.0* 7.1  HGB 11.0* 9.5* 9.2* 9.2*  HCT 33.2* 30.0* 29.7* 29.1*  MCV 83.4 83.8 85.6 86.4  PLT 424* 381 356 319   Cardiac Enzymes: No results for input(s): CKTOTAL, CKMB, CKMBINDEX, TROPONINI in the last 168 hours. BNP: Invalid input(s): POCBNP CBG:  Recent Labs Lab 06/08/17 0739 06/08/17 1101 06/08/17 1643 06/08/17 2147 06/09/17 0622  GLUCAP 178* 169* 158* 211* 240*   D-Dimer No results for input(s): DDIMER in the last 72 hours. Hgb A1c No results for input(s): HGBA1C in the last 72 hours. Lipid Profile No results for input(s): CHOL, HDL, LDLCALC, TRIG, CHOLHDL, LDLDIRECT in the last 72 hours. Thyroid function studies No results for input(s): TSH, T4TOTAL, T3FREE, THYROIDAB in the last 72 hours.  Invalid input(s): FREET3 Anemia work up No results for input(s): VITAMINB12, FOLATE, FERRITIN, TIBC, IRON, RETICCTPCT in the last 72 hours. Urinalysis    Component Value Date/Time   COLORURINE STRAW (A) 06/03/2017 0208   APPEARANCEUR HAZY (A) 06/03/2017 0208   LABSPEC 1.043 (H) 06/03/2017 0208   PHURINE 5.0 06/03/2017 0208   GLUCOSEU >=500 (  A) 06/03/2017 0208   HGBUR NEGATIVE 06/03/2017 0208   BILIRUBINUR NEGATIVE 06/03/2017 0208   BILIRUBINUR neg 10/29/2015 1256   KETONESUR 5 (A) 06/03/2017 0208   PROTEINUR NEGATIVE 06/03/2017 0208   UROBILINOGEN 0.2 10/29/2015 1256   UROBILINOGEN 0.2 11/18/2014 2325   NITRITE NEGATIVE 06/03/2017 0208   LEUKOCYTESUR NEGATIVE 06/03/2017 0208   Sepsis Labs Invalid input(s): PROCALCITONIN,  WBC,  LACTICIDVEN Microbiology Recent Results (from the past 240 hour(s))  Blood Culture (routine x 2)     Status: Abnormal   Collection Time: 06/02/17  1:32 PM  Result Value Ref Range Status   Specimen Description BLOOD LEFT  ANTECUBITAL  Final   Special Requests   Final    BOTTLES DRAWN AEROBIC AND ANAEROBIC Blood Culture adequate volume   Culture  Setup Time   Final    GRAM POSITIVE COCCI ANAEROBIC BOTTLE ONLY CRITICAL RESULT CALLED TO, READ BACK BY AND VERIFIED WITH: PHARMD M TURNER [phone]    Culture (A)  Final    STAPHYLOCOCCUS SPECIES (COAGULASE NEGATIVE) THE SIGNIFICANCE OF ISOLATING THIS ORGANISM FROM A SINGLE SET OF BLOOD CULTURES WHEN MULTIPLE SETS ARE DRAWN IS UNCERTAIN. PLEASE NOTIFY THE MICROBIOLOGY DEPARTMENT WITHIN ONE WEEK IF SPECIATION AND SENSITIVITIES ARE REQUIRED.    Report Status 06/04/2017 FINAL  Final  Blood Culture ID Panel (Reflexed)     Status: Abnormal   Collection Time: 06/02/17  1:32 PM  Result Value Ref Range Status   Enterococcus species NOT DETECTED NOT DETECTED Final   Vancomycin resistance NOT DETECTED NOT DETECTED Final   Listeria monocytogenes NOT DETECTED NOT DETECTED Final   Staphylococcus species DETECTED (A) NOT DETECTED Final    Comment: CRITICAL RESULT CALLED TO, READ BACK BY AND VERIFIED WITH: PHARMD M TURNER [phone] MLM    Staphylococcus aureus NOT DETECTED NOT DETECTED Final   Methicillin resistance DETECTED (A) NOT DETECTED Final    Comment: CRITICAL RESULT CALLED TO, READ BACK BY AND VERIFIED WITH: PHARMD M TURNER [phone] MLM    Streptococcus species NOT DETECTED NOT DETECTED Final   Streptococcus agalactiae NOT DETECTED NOT DETECTED Final   Streptococcus pneumoniae NOT DETECTED NOT DETECTED Final   Streptococcus pyogenes NOT DETECTED NOT DETECTED Final   Acinetobacter baumannii NOT DETECTED NOT DETECTED Final   Enterobacteriaceae species NOT DETECTED NOT DETECTED Final   Enterobacter cloacae complex NOT DETECTED NOT DETECTED Final   Escherichia coli NOT DETECTED NOT DETECTED Final   Klebsiella oxytoca NOT DETECTED NOT DETECTED Final   Klebsiella pneumoniae NOT DETECTED NOT DETECTED Final   Proteus species NOT DETECTED NOT DETECTED Final    Serratia marcescens NOT DETECTED NOT DETECTED Final   Carbapenem resistance NOT DETECTED NOT DETECTED Final   Haemophilus influenzae NOT DETECTED NOT DETECTED Final   Neisseria meningitidis NOT DETECTED NOT DETECTED Final   Pseudomonas aeruginosa NOT DETECTED NOT DETECTED Final   Candida albicans NOT DETECTED NOT DETECTED Final   Candida glabrata NOT DETECTED NOT DETECTED Final   Candida krusei NOT DETECTED NOT DETECTED Final   Candida parapsilosis NOT DETECTED NOT DETECTED Final   Candida tropicalis NOT DETECTED NOT DETECTED Final  Blood Culture (routine x 2)     Status: None   Collection Time: 06/02/17  1:50 PM  Result Value Ref Range Status   Specimen Description BLOOD RIGHT HAND  Final   Special Requests   Final    BOTTLES DRAWN AEROBIC AND ANAEROBIC Blood Culture adequate volume   Culture NO GROWTH 5 DAYS  Final  Report Status 06/07/2017 FINAL  Final  Culture, blood (routine x 2)     Status: None   Collection Time: 06/03/17  4:09 PM  Result Value Ref Range Status   Specimen Description BLOOD RIGHT ARM  Final   Special Requests   Final    BOTTLES DRAWN AEROBIC AND ANAEROBIC Blood Culture adequate volume   Culture NO GROWTH 5 DAYS  Final   Report Status 06/08/2017 FINAL  Final  Culture, blood (routine x 2)     Status: None   Collection Time: 06/03/17  4:09 PM  Result Value Ref Range Status   Specimen Description BLOOD LEFT ANTECUBITAL  Final   Special Requests   Final    BOTTLES DRAWN AEROBIC AND ANAEROBIC Blood Culture adequate volume   Culture NO GROWTH 5 DAYS  Final   Report Status 06/08/2017 FINAL  Final  Culture, respiratory (NON-Expectorated)     Status: None (Preliminary result)   Collection Time: 06/08/17  9:32 PM  Result Value Ref Range Status   Specimen Description TRACHEAL ASPIRATE  Final   Special Requests NONE  Final   Gram Stain   Final    ABUNDANT WBC PRESENT,BOTH PMN AND MONONUCLEAR RARE GRAM NEGATIVE RODS    Culture PENDING  Incomplete   Report Status  PENDING  Incomplete     Time coordinating discharge: 45 minutes  SIGNED:   Tawni Millers, MD  [facility] 06/09/2017, 8:38 AM Pager [phone]  If 7PM-7AM, please contact night-coverage [email or web address] Password [email or web address]

## 2017-06-10 ENCOUNTER — Telehealth: Payer: Self-pay

## 2017-06-10 ENCOUNTER — Other Ambulatory Visit: Payer: Self-pay

## 2017-06-10 NOTE — Patient Outreach (Signed)
Transition of care: New referral.  Patient discharged on 06/09/2017.  Placed call to patient. No answer. No identified machine.  PLAN: Will continue to outreach patient for transition of care.  Tomasa Rand, RN, BSN, CEN [facility] [employer] [phone]

## 2017-06-10 NOTE — Telephone Encounter (Signed)
[facility] Post-discharge Follow-Up Phone Call:  Date of Discharge:  06/09/17 Principal Discharge Diagnosis(es):  Acute respiratory failure due to asthma exacerbation, subglottic stenosis, DM- type 2  Post-discharge Communication: (Clearly document all attempts clearly and date contact made) call placed to the patient's home  Call Completed: Yes                    With Whom: patient's husband, [name],  The patient was in the background. As per significant history, note that DPR on file for [name] Interpreter Needed: no     Please check all that apply:  X Patient is knowledgeable of his/her condition(s) and/or treatment. ? Patient is caring for self at home.  X Patient is receiving assist at home from family and/or caregiver. Family and/or caregiver is knowledgeable of patient's condition(s) and/or treatment.  - Her husband is assisting with care as needed. He stated that she is ambulating independently, is slow with personal care.  He noted that he has been doing the meal preparation.  X  Patient is receiving home health services. If so, name of agency. - she is to be receiving home health RN and PT from [facility].  Her husband stated that he has not heard from [facility] yet.  She is also being followed by [facility] and he stated that he has not heard from [facility] either.      Medication Reconciliation:  ? Medication list reviewed with patient. X Patient obtained all discharge medications. If not, why?  Her husband stated that she has all medications except for metformin.  Informed him that there are refills for metformin at their [facility]. He stated that he would call and order.  He noted that they have not been able to get the glucometer. He stated that the pharmacy only holds it for 9 days and they will need a new prescription. They were going to pick it up and she went in the hospital  Charles asked the patient if she had any questions about her medications or wanted to  review the list with this CM and she stated that she had no questions and did not need the review.  Explained to [name] that as per the discharge medication instructions , she is to stop taking ibuprofen and he said that she is not taking it. He also noted that they have the oxycodone that was ordered at discharge.   Theda Sers, [facility] notified of the need for a new prescription for the glucometer.     Activities of Daily Living:  ? Independent X  Needs assist (describe; ? home DME used)  [name] stated that she has an O2 concentrator, O2 cylinders, suction and trach supplies from [facility]. He stated that he needs to check the trach supplies to see what is needed and will contact [place].   ? [facility] (describe, ? home DME used)   Community resources in place for patient:  ? None  X  Home Health/Home DME - [facility] , [facility] for DME /trach supplies, [facility] ? Assisted Living ? Support Group          Questions/Concerns discussed: [name] stated that overall she is doing " pretty good."  They did not have any additional questions at this time. The patient was agreeable to scheduling a visit with Dr Jarold Song in the [facility] and an appointment was scheduled for 06/18/17 @ 0945. [name] noted that she would have transportation to the clinic.

## 2017-06-11 ENCOUNTER — Other Ambulatory Visit: Payer: Self-pay | Admitting: *Deleted

## 2017-06-11 ENCOUNTER — Other Ambulatory Visit: Payer: Self-pay | Admitting: Pharmacist

## 2017-06-11 ENCOUNTER — Other Ambulatory Visit: Payer: Self-pay

## 2017-06-11 MED ORDER — ACCU-CHEK SOFT TOUCH LANCETS MISC: 12 refills | Status: DC

## 2017-06-11 MED ORDER — ACCU-CHEK AVIVA PLUS W/DEVICE KIT: PACK | 0 refills | Status: DC

## 2017-06-11 MED ORDER — GLUCOSE BLOOD VI STRP: ORAL_STRIP | 12 refills | Status: DC

## 2017-06-11 NOTE — Telephone Encounter (Signed)
Script needs to be resent and billed through both medicare and medicaid.  Script was $223 b/c billing code was missing for Medicare.  Please call once this has been resubmitted.  Ty,  -LL

## 2017-06-11 NOTE — Telephone Encounter (Signed)
Spoke to pharmacy and the issue was they did not see a Dx code on the prescriptions for this patient- I did let them know she does have a Dx code on all of her diabetic medication and we both agreed no further action is needed

## 2017-06-11 NOTE — Patient Outreach (Addendum)
[facility]) Care Management  06/11/2017  [name] [birth year] [id number]   Transition of care  Patient discharged to home on 06/09/17  Placed call to patient, no answer able to leave a HIPAA compliant message on answering machine, requesting a return call number provided.   Plan Will await return call if no response, will continue to outreach for transition of care services.   [place]vo call to patient contact listed on [facility] consent,patient , Husband [name], no answer able to leave a HIPAA compliant message requesting a return call.   Joylene Draft, RN, [place] Management Coordinator  [phone]- Mobile [phone]- Toll Free Main Office

## 2017-06-12 ENCOUNTER — Other Ambulatory Visit: Payer: Self-pay | Admitting: *Deleted

## 2017-06-12 ENCOUNTER — Telehealth: Payer: Self-pay | Admitting: Family Medicine

## 2017-06-12 LAB — CULTURE, RESPIRATORY W GRAM STAIN

## 2017-06-12 LAB — CULTURE, RESPIRATORY

## 2017-06-12 NOTE — Telephone Encounter (Signed)
Call placed to patient [phone], in order to inform her that the prescription for her glucometer was sent to [facility] and was informed that patient needs to present her insurance card in order to get prescription. Name they have on file doesn't match her insurance card. No answer. Left message for patient to return my call at [phone].

## 2017-06-12 NOTE — Telephone Encounter (Signed)
Mary from [facility] called stating that they need a new verbal order for Physical therapy , for them to start tomorrow 09/22. Please f/up

## 2017-06-12 NOTE — Patient Outreach (Addendum)
[name]) Care Management  [place]  06/12/2017   [name] [birth year] [id number]   Transition of care call [facility] Discharge on 9/19 Dx : Acute respiratory failure due to asthma exacerbation, subglottic stenosis, DM- type 2   Placed transition of care call to patient husband [name], explained reason for call, HIPAA information verified. He reports patient is doing pretty good, complains of discomfort at trach site, gets relief with oxycodone as needed. Husband request call a little later today,because he is currently not at home, to complete call.   Mitchell return call to [name], reports he is in the middle of something request call later today.  1530 Return call to [name], spouse of patient , HIPAA identifier information verified.  Husband reports patient resting in bed at present,discussed he usually speaks for her for over the phone conversation due to her voice being low. Through conversation questions asked and husband heard over phone asking question back to patient  then providing answer. He reports patient without increase in shortness of breath,increased cough or thicker sputum. He discussed she usually wears humidified oxygen, with oxygen saturation in the 98% range.. Husband reports they have all  trach supplies but will soon need replacements by next week. He also mentioned they are down to  2 portable tanks for transport.   Husband reports patient is tolerating up out of bed, tolerating diet, without nausea . Diabetes:During  medication review with patient/husband  reports that she has and is taking insulin Lantus and novolog. He discussed patient glucometer is not working, he believes it needs batteries, also reports they have a prescription for a new meter/strips to pick up at [facility], reminded husband they need to take patient insurance card to pharmacy.  Reinforced the  importance of checking blood sugars especially with taking insulin . Review low blood sugar symptoms /treatment, denies symptoms husband reports he will get meter today.   Husband reports patient manages her own medications daily, she takes medications from pill bottles daily. Husband reports that they have not been able afford tussionex, otherwise report  medications are affordable . One prescription at discharge for oxycodone was filled, able to report to stop taking  Ibuprofen   Patient was recently discharged from hospital and all medications have been reviewed.  Husband reports they have not yet been contacted by [facility] since discharge home, discussed they where providing home health services prior to hospital admission.  Husband discussed upcoming medical appointment with PCP 9/27 and with ENT in [facility] on 10/3,reports that patient will have transportation by him or patient daughter.    Encounter Medications:  Outpatient Encounter Prescriptions as of 06/12/2017  Medication Sig  . albuterol (PROVENTIL HFA;VENTOLIN HFA) 108 (90 Base) MCG/ACT inhaler Inhale 2 puffs into the lungs every 6 (six) hours as needed for wheezing or shortness of breath.  Marland Kitchen albuterol (PROVENTIL) (2.5 MG/3ML) 0.083% nebulizer solution Take 3 mLs (2.5 mg total) by nebulization every 6 (six) hours as needed for wheezing or shortness of breath. Reported on 02/22/2016  . amLODipine (NORVASC) 10 MG tablet Take 1 tablet (10 mg total) by mouth daily.  . Artificial Tear Ointment (ARTIFICIAL TEARS) ointment Place 1 application into both eyes at bedtime.   Marland Kitchen atorvastatin (LIPITOR) 20 MG tablet Take 1 tablet (20 mg total) by mouth daily.  . Blood Glucose Monitoring Suppl (ACCU-CHEK AVIVA PLUS) w/Device KIT Used to check blood sugars once daily. E11.9  . carvedilol (  COREG) 6.25 MG tablet Take 1 tablet (6.25 mg total) by mouth 2 (two) times daily with a meal.  . chlorhexidine gluconate, MEDLINE KIT, (PERIDEX) 0.12 %  solution 15 mLs by Mouth Rinse route 2 (two) times daily.  . Fluticasone-Salmeterol (ADVAIR DISKUS) 250-50 MCG/DOSE AEPB Inhale 1 puff into the lungs 2 (two) times daily.  Marland Kitchen gabapentin (NEURONTIN) 300 MG capsule Take 1 capsule (300 mg total) by mouth 3 (three) times daily.  . Insulin Glargine (LANTUS SOLOSTAR) 100 UNIT/ML Solostar Pen Inject 25 Units into the skin at bedtime.  . insulin lispro (HUMALOG) 100 UNIT/ML injection Inject 8 Units into the skin 3 (three) times daily with meals.  . Lancets (ACCU-CHEK SOFT TOUCH) lancets Use as instructed once daily. E11.9  . metFORMIN (GLUCOPHAGE-XR) 500 MG 24 hr tablet Take 1 tablet (500 mg total) by mouth daily with breakfast.  . methocarbamol (ROBAXIN) 500 MG tablet Take 500 mg by mouth every 8 (eight) hours as needed for muscle spasms.  Marland Kitchen oxyCODONE-acetaminophen (PERCOCET/ROXICET) 5-325 MG tablet Take 1 tablet by mouth every 6 (six) hours as needed for severe pain.  . pantoprazole (PROTONIX) 40 MG tablet Take 40 mg by mouth daily.   . sertraline (ZOLOFT) 100 MG tablet Place 1 tablet (100 mg total) into feeding tube daily.  Marland Kitchen tiZANidine (ZANAFLEX) 4 MG tablet Take 1 tablet (4 mg total) by mouth every 8 (eight) hours as needed for muscle spasms.  Marland Kitchen topiramate (TOPAMAX) 100 MG tablet Take 1 tablet (100 mg total) by mouth 2 (two) times daily.  . chlorpheniramine-HYDROcodone (TUSSIONEX PENNKINETIC ER) 10-8 MG/5ML SUER Take 5 mLs by mouth every 12 (twelve) hours as needed for cough. (Patient not taking: Reported on 06/12/2017)  . Dulaglutide 1.5 MG/0.5ML SOPN Inject 1.5 mg into the skin once a week. (Patient not taking: Reported on 06/12/2017)  . glucose blood (ACCU-CHEK AVIVA) test strip Use as directed to check blood sugars once daily. E11.9 (Patient not taking: Reported on 06/12/2017)  . guaiFENesin-dextromethorphan (ROBITUSSIN DM) 100-10 MG/5ML syrup Take 10 mLs by mouth every 4 (four) hours as needed for cough. (Patient not taking: Reported on 06/12/2017)  .  NOVOLOG FLEXPEN 100 UNIT/ML FlexPen as directed.  . sodium chloride HYPERTONIC 3 % nebulizer solution Take 4 mLs by nebulization 2 (two) times daily. (Patient not taking: Reported on 06/12/2017)  . SUMAtriptan (IMITREX) 100 MG tablet Take 100 mg by mouth daily as needed. For migraine   No facility-administered encounter medications on file as of 06/12/2017.      Plan:  Patient will receive weekly outreaches as part of transition of care program. Will update assigned care manager Tomasa Rand that will follow up in the next week.  Pl[facility] to [facility] to notify of need to reorder for trach supplies, representative reports she will call patient husband, also spoke with oxygen department regarding need additional portable tank and representative will put in a ticket request to notify  patient request. Placed call to [facility] spoke with Estill Bamberg, she reports they just received orders on today and will reach out to patient to been seen over the weekend.  Reviewed to notify MD of worsening symptoms and emergency help for urgent needs. Will send MD barrier letter.   Joylene Draft, RN, [place] Management Coordinator  [phone]- Mobile [phone]- Toll Free Main Office

## 2017-06-13 ENCOUNTER — Encounter: Payer: Self-pay | Admitting: *Deleted

## 2017-06-13 DIAGNOSIS — J398 Other specified diseases of upper respiratory tract: Secondary | ICD-10-CM | POA: Diagnosis not present

## 2017-06-13 DIAGNOSIS — J9611 Chronic respiratory failure with hypoxia: Secondary | ICD-10-CM | POA: Diagnosis not present

## 2017-06-13 DIAGNOSIS — E119 Type 2 diabetes mellitus without complications: Secondary | ICD-10-CM | POA: Diagnosis not present

## 2017-06-13 DIAGNOSIS — J386 Stenosis of larynx: Secondary | ICD-10-CM | POA: Diagnosis not present

## 2017-06-13 DIAGNOSIS — Z430 Encounter for attention to tracheostomy: Secondary | ICD-10-CM | POA: Diagnosis not present

## 2017-06-13 DIAGNOSIS — J45909 Unspecified asthma, uncomplicated: Secondary | ICD-10-CM | POA: Diagnosis not present

## 2017-06-15 ENCOUNTER — Telehealth: Payer: Self-pay | Admitting: Family Medicine

## 2017-06-15 DIAGNOSIS — I130 Hypertensive heart and chronic kidney disease with heart failure and stage 1 through stage 4 chronic kidney disease, or unspecified chronic kidney disease: Secondary | ICD-10-CM | POA: Diagnosis not present

## 2017-06-15 DIAGNOSIS — J40 Bronchitis, not specified as acute or chronic: Secondary | ICD-10-CM | POA: Diagnosis not present

## 2017-06-15 DIAGNOSIS — J386 Stenosis of larynx: Secondary | ICD-10-CM | POA: Diagnosis not present

## 2017-06-15 DIAGNOSIS — J45901 Unspecified asthma with (acute) exacerbation: Secondary | ICD-10-CM | POA: Diagnosis not present

## 2017-06-15 DIAGNOSIS — J9611 Chronic respiratory failure with hypoxia: Secondary | ICD-10-CM | POA: Diagnosis not present

## 2017-06-15 DIAGNOSIS — I5032 Chronic diastolic (congestive) heart failure: Secondary | ICD-10-CM | POA: Diagnosis not present

## 2017-06-15 NOTE — Telephone Encounter (Signed)
Pam from [facility] called requesting an order for continuity of care for skilled nursing 2 times a week for 5 weeks. Pt currently has a URI. Please f/up

## 2017-06-15 NOTE — Telephone Encounter (Signed)
Okay to give verbal orders?  ?

## 2017-06-15 NOTE — Telephone Encounter (Signed)
Pam was called and given verbal orders.

## 2017-06-15 NOTE — Telephone Encounter (Signed)
Is it ok to give verbal orders 

## 2017-06-16 ENCOUNTER — Emergency Department (HOSPITAL_COMMUNITY): Payer: Medicare Other

## 2017-06-16 ENCOUNTER — Emergency Department (HOSPITAL_COMMUNITY)
Admission: EM | Admit: 2017-06-16 | Discharge: 2017-06-17 | Disposition: A | Discharge status: Home / Self Care | Payer: Medicare Other | Attending: Emergency Medicine | Admitting: Emergency Medicine

## 2017-06-16 ENCOUNTER — Other Ambulatory Visit: Payer: Self-pay

## 2017-06-16 ENCOUNTER — Encounter (HOSPITAL_COMMUNITY): Payer: Self-pay | Admitting: Emergency Medicine

## 2017-06-16 DIAGNOSIS — E119 Type 2 diabetes mellitus without complications: Secondary | ICD-10-CM | POA: Diagnosis not present

## 2017-06-16 DIAGNOSIS — R0602 Shortness of breath: Secondary | ICD-10-CM | POA: Diagnosis not present

## 2017-06-16 DIAGNOSIS — Z87891 Personal history of nicotine dependence: Secondary | ICD-10-CM | POA: Insufficient documentation

## 2017-06-16 DIAGNOSIS — J45909 Unspecified asthma, uncomplicated: Secondary | ICD-10-CM | POA: Diagnosis not present

## 2017-06-16 DIAGNOSIS — Z794 Long term (current) use of insulin: Secondary | ICD-10-CM | POA: Insufficient documentation

## 2017-06-16 DIAGNOSIS — J0410 Acute tracheitis without obstruction: Secondary | ICD-10-CM | POA: Diagnosis not present

## 2017-06-16 DIAGNOSIS — Z930 Tracheostomy status: Secondary | ICD-10-CM | POA: Insufficient documentation

## 2017-06-16 DIAGNOSIS — Z79899 Other long term (current) drug therapy: Secondary | ICD-10-CM | POA: Insufficient documentation

## 2017-06-16 DIAGNOSIS — I10 Essential (primary) hypertension: Secondary | ICD-10-CM | POA: Diagnosis not present

## 2017-06-16 DIAGNOSIS — Z9049 Acquired absence of other specified parts of digestive tract: Secondary | ICD-10-CM | POA: Diagnosis not present

## 2017-06-16 DIAGNOSIS — F329 Major depressive disorder, single episode, unspecified: Secondary | ICD-10-CM | POA: Insufficient documentation

## 2017-06-16 DIAGNOSIS — J9509 Other tracheostomy complication: Secondary | ICD-10-CM | POA: Diagnosis present

## 2017-06-16 LAB — I-STAT CG4 LACTIC ACID, ED: LACTIC ACID, VENOUS: 3.15 mmol/L — AB (ref 0.5–1.9)

## 2017-06-16 LAB — BASIC METABOLIC PANEL
ANION GAP: 8 (ref 5–15)
BUN: 11 mg/dL (ref 6–20)
CALCIUM: 8.9 mg/dL (ref 8.9–10.3)
CO2: 19 mmol/L — ABNORMAL LOW (ref 22–32)
CREATININE: 0.96 mg/dL (ref 0.44–1.00)
Chloride: 112 mmol/L — ABNORMAL HIGH (ref 101–111)
GFR calc non Af Amer: >60 mL/min (ref >60)
Glucose, Bld: 198 mg/dL — ABNORMAL HIGH (ref 65–99)
Potassium: 3.4 mmol/L — ABNORMAL LOW (ref 3.5–5.1)
SODIUM: 139 mmol/L (ref 135–145)

## 2017-06-16 LAB — CBC WITH DIFFERENTIAL/PLATELET
BASOS ABS: 0 10*3/uL (ref 0.0–0.1)
BASOS PCT: 0 %
EOS ABS: 0.1 10*3/uL (ref 0.0–0.7)
Eosinophils Relative: 1 %
HEMATOCRIT: 31.3 % — AB (ref 36.0–46.0)
HEMOGLOBIN: 10.1 g/dL — AB (ref 12.0–15.0)
Lymphocytes Relative: 49 %
Lymphs Abs: 5.3 10*3/uL — ABNORMAL HIGH (ref 0.7–4.0)
MCH: 27 pg (ref 26.0–34.0)
MCHC: 32.3 g/dL (ref 30.0–36.0)
MCV: 83.7 fL (ref 78.0–100.0)
MONO ABS: 0.8 10*3/uL (ref 0.1–1.0)
MONOS PCT: 7 %
NEUTROS ABS: 4.6 10*3/uL (ref 1.7–7.7)
NEUTROS PCT: 43 %
Platelets: 338 10*3/uL (ref 150–400)
RBC: 3.74 MIL/uL — ABNORMAL LOW (ref 3.87–5.11)
RDW: 14.7 % (ref 11.5–15.5)
WBC: 10.8 10*3/uL — ABNORMAL HIGH (ref 4.0–10.5)

## 2017-06-16 LAB — I-STAT BETA HCG BLOOD, ED (MC, WL, AP ONLY): I-stat hCG, quantitative: <5 m[IU]/mL (ref <5)

## 2017-06-16 MED ORDER — LACTATED RINGERS IV BOLUS (SEPSIS)
2000.0000 mL | Freq: Once | INTRAVENOUS | Status: AC
  Administered 2017-06-16: 2000 mL via INTRAVENOUS

## 2017-06-16 MED ORDER — OXYCODONE-ACETAMINOPHEN 5-325 MG PO TABS
2.0000 | ORAL_TABLET | Freq: Once | ORAL | Status: AC
  Administered 2017-06-16: 2 via ORAL
  Filled 2017-06-16: qty 2

## 2017-06-16 NOTE — ED Provider Notes (Signed)
[place] DEPT Provider Note   CSN: [id number] Arrival date & time: 06/16/17  1936     History   Chief Complaint Chief Complaint  Patient presents with  . Shortness of Breath    [name] is a 46 y.o. female.  The history is provided by the patient, the spouse and medical records. No language interpreter was used.    46 year old female history of asthma, tracheal stenosis s/p tracheostomy, diabetes, HTN, obesity, and OSA who p/w tracheostomy site pain and cough. Pt able to mouth answers, does not have [place] valve. She describes recurrent pain and irritation of trach site over past month ago. Irritation leads to coughing fits, which worsens discomfort. Endorses fever of 100.4 at home yesterday. Minimal secretions at bedside. Trach exchanged by [facility] last month. Tolerating PO but having nausea with coughing episodes. Pain improved and tolerable with percocet, but ran out today. She does not yet have a pain doctor. Was discharged 1 week ago with 10 tablets of percocet.   Past Medical History:  Diagnosis Date  . Arthritis   . Asthma   . Chronic back pain   . Chronic chest pain   . Depression   . DM (diabetes mellitus) ([place])    INSULIN DEPENDENT  . GERD (gastroesophageal reflux disease)   . Headache(784.0)   . Hypertension   . Hypokalemia   . Respiratory disease 05/2017  . Tracheostomy in place [facility]) 02/2017    Patient Active Problem List   Diagnosis Date Noted  . Acute respiratory distress 06/02/2017  . Elevated lactic acid level 06/02/2017  . Sepsis ([place]) 05/07/2017  . Cough 05/06/2017  . Normocytic anemia 05/06/2017  . Hypokalemia 05/06/2017  . Tracheostomy status ([place]) 04/28/2017  . Tracheal stenosis 03/04/2017  . Acute blood loss anemia   . Generalized anxiety disorder   . Hypoalbuminemia due to protein-calorie malnutrition ([place])   . Severe asthma with acute exacerbation   . Critical illness myopathy 02/27/2017  . Type 2  diabetes mellitus with diabetic polyneuropathy, without long-term current use of insulin ([place])   . Morbid obesity ([place])   . Chronic pain syndrome   . Critical illness polyneuropathy ([place])   . Acute renal failure ([place])   . Gastritis and gastroduodenitis 02/22/2016  . Depression 02/22/2016  . Migraine 01/30/2015  . Back pain 09/05/2013  . Essential hypertension 09/05/2013  . Acute bronchitis 05/01/2013  . Asthma exacerbation 04/27/2013  . Allergic rhinitis, seasonal 08/11/2012  . Chronic cough 08/11/2012  . Sleep apnea, obstructive 12/04/2011    Past Surgical History:  Procedure Laterality Date  . APPENDECTOMY    . CESAREAN SECTION     x 3  . CHOLECYSTECTOMY N/A 03/02/2014   Procedure: LAPAROSCOPIC CHOLECYSTECTOMY;  Surgeon: Joyice Faster. Cornett, MD;  Location: [place];  Service: General;  Laterality: N/A;  . HERNIA REPAIR    . PANENDOSCOPY N/A 03/04/2017   Procedure: PANENDOSCOPY WITH POSSIBLE FOREIGN BODY REMOVAL;  Surgeon: Jodi Marble, MD;  Location: [place];  Service: ENT;  Laterality: N/A;  . ROTATOR CUFF REPAIR    . TRACHEOSTOMY  02/2017  . VESICOVAGINAL FISTULA CLOSURE W/ TAH  2009    OB History    No data available       Home Medications    Prior to Admission medications   Medication Sig Start Date End Date Taking? Authorizing Provider  albuterol (PROVENTIL HFA;VENTOLIN HFA) 108 (90 Base) MCG/ACT inhaler Inhale 2 puffs into the lungs every 6 (six) hours as needed  for wheezing or shortness of breath. 04/28/17   Arnoldo Morale, MD  albuterol (PROVENTIL) (2.5 MG/3ML) 0.083% nebulizer solution Take 3 mLs (2.5 mg total) by nebulization every 6 (six) hours as needed for wheezing or shortness of breath. Reported on 02/22/2016 04/28/17   Arnoldo Morale, MD  amLODipine (NORVASC) 10 MG tablet Take 1 tablet (10 mg total) by mouth daily. 05/26/17   Arnoldo Morale, MD  Artificial Tear Ointment (ARTIFICIAL TEARS) ointment Place 1 application into both eyes at bedtime.     [provider]   atorvastatin (LIPITOR) 20 MG tablet Take 1 tablet (20 mg total) by mouth daily. 05/26/17   Arnoldo Morale, MD  benzonatate (TESSALON) 100 MG capsule Take 1 capsule (100 mg total) by mouth every 8 (eight) hours. 06/17/17   Payton Emerald, MD  Blood Glucose Monitoring Suppl (ACCU-CHEK AVIVA PLUS) w/Device KIT Used to check blood sugars once daily. E11.9 06/11/17   Arnoldo Morale, MD  carvedilol (COREG) 6.25 MG tablet Take 1 tablet (6.25 mg total) by mouth 2 (two) times daily with a meal. 05/26/17   Arnoldo Morale, MD  chlorhexidine gluconate, MEDLINE KIT, (PERIDEX) 0.12 % solution 15 mLs by Mouth Rinse route 2 (two) times daily. 03/05/17   Arnell Asal, NP  chlorpheniramine-HYDROcodone (TUSSIONEX PENNKINETIC ER) 10-8 MG/5ML SUER Take 5 mLs by mouth every 12 (twelve) hours as needed for cough. Patient not taking: Reported on 06/12/2017 05/14/17   Arnoldo Morale, MD  ciprofloxacin (CIPRO) 500 MG tablet Take 1 tablet (500 mg total) by mouth every 12 (twelve) hours. 06/17/17 06/24/17  Payton Emerald, MD  Dulaglutide 1.5 MG/0.5ML SOPN Inject 1.5 mg into the skin once a week. Patient not taking: Reported on 06/12/2017 05/27/17   Renato Shin, MD  Fluticasone-Salmeterol (ADVAIR DISKUS) 250-50 MCG/DOSE AEPB Inhale 1 puff into the lungs 2 (two) times daily. 05/26/17   Arnoldo Morale, MD  gabapentin (NEURONTIN) 300 MG capsule Take 1 capsule (300 mg total) by mouth 3 (three) times daily. 04/28/17   Arnoldo Morale, MD  glucose blood (ACCU-CHEK AVIVA) test strip Use as directed to check blood sugars once daily. E11.9 Patient not taking: Reported on 06/12/2017 06/11/17   Arnoldo Morale, MD  guaiFENesin-dextromethorphan (ROBITUSSIN DM) 100-10 MG/5ML syrup Take 10 mLs by mouth every 4 (four) hours as needed for cough. Patient not taking: Reported on 06/12/2017 05/08/17   Aline August, MD  Insulin Glargine (LANTUS SOLOSTAR) 100 UNIT/ML Solostar Pen Inject 25 Units into the skin at bedtime. 05/21/17 06/20/17  [provider]  insulin lispro  (HUMALOG) 100 UNIT/ML injection Inject 8 Units into the skin 3 (three) times daily with meals. 05/20/17 06/19/17  [provider]  Lancets (ACCU-CHEK SOFT TOUCH) lancets Use as instructed once daily. E11.9 06/11/17   Arnoldo Morale, MD  metFORMIN (GLUCOPHAGE-XR) 500 MG 24 hr tablet Take 1 tablet (500 mg total) by mouth daily with breakfast. 05/29/17   Renato Shin, MD  methocarbamol (ROBAXIN) 500 MG tablet Take 500 mg by mouth every 8 (eight) hours as needed for muscle spasms.    [provider]  NOVOLOG FLEXPEN 100 UNIT/ML FlexPen as directed. 05/20/17   [provider]  oxyCODONE-acetaminophen (PERCOCET/ROXICET) 5-325 MG tablet Take 1 tablet by mouth every 8 (eight) hours as needed for severe pain. 06/17/17   Payton Emerald, MD  pantoprazole (PROTONIX) 40 MG tablet Take 40 mg by mouth daily.  03/19/17   [provider]  predniSONE (DELTASONE) 10 MG tablet Take 4 tablets (40 mg total) by mouth daily. 06/17/17 06/22/17  Payton Emerald, MD  sertraline (ZOLOFT) 100 MG tablet Place 1 tablet (100 mg total) into feeding tube daily. 04/28/17   Arnoldo Morale, MD  sodium chloride HYPERTONIC 3 % nebulizer solution Take 4 mLs by nebulization 2 (two) times daily. Patient not taking: Reported on 06/12/2017 05/08/17   Aline August, MD  SUMAtriptan (IMITREX) 100 MG tablet Take 100 mg by mouth daily as needed. For migraine    [provider]  tiZANidine (ZANAFLEX) 4 MG tablet HOLD WHILE TAKING CIPROFLOXACIN 06/17/17   Herbert Marken, Pilar Plate, MD  topiramate (TOPAMAX) 100 MG tablet Take 1 tablet (100 mg total) by mouth 2 (two) times daily. 05/26/17   Arnoldo Morale, MD    Family History Family History  Problem Relation Age of Onset  . Heart attack Mother   . Stroke Mother   . Diabetes Mother   . Hypertension Mother   . Arthritis Mother   . Stroke Father   . Hypertension Sister   . Diabetes Sister   . Seizures Brother   . Diabetes Brother     Social History Social History  Substance Use Topics    . Smoking status: Former Smoker    Packs/day: 0.25    Years: 22.00    Types: Cigarettes    Quit date: 01/20/2017  . Smokeless tobacco: Never Used  . Alcohol use No     Comment: socially     Allergies   Reglan [metoclopramide]   Review of Systems Review of Systems  Constitutional: Negative for chills and fever.  HENT: Negative for ear pain and sore throat.   Eyes: Negative for pain and visual disturbance.  Respiratory: Positive for shortness of breath. Negative for cough.        Positive for tracheostomy pain  Cardiovascular: Negative for chest pain and palpitations.  Gastrointestinal: Negative for abdominal pain and vomiting.  Genitourinary: Negative for dysuria and hematuria.  Musculoskeletal: Negative for arthralgias and back pain.  Skin: Negative for color change and rash.  Neurological: Negative for seizures and syncope.  All other systems reviewed and are negative.    Physical Exam Updated Vital Signs BP 134/78 (BP Location: Left Arm)   Pulse 66   Temp 99 F (37.2 C) (Oral)   Resp 16   Ht 5' 1"  (1.549 m)   SpO2 100%   Physical Exam  Constitutional: She appears well-developed. No distress.  HENT:  Head: Normocephalic and atraumatic.  Eyes: Conjunctivae are normal.  Neck: Neck supple.  Tracheostomy c/d/i. Minimal secretions on suctioning. Occasional coughing spells  Cardiovascular: Normal rate and regular rhythm.   No murmur heard. Pulmonary/Chest: Effort normal and breath sounds normal. No respiratory distress.  Abdominal: Soft. There is no tenderness.  Musculoskeletal: She exhibits no edema.  Neurological: She is alert. No cranial nerve deficit. Coordination normal.  5/5 motor strength and intact sensation in all extremities. Finger-to-nose intact bilaterally  Skin: Skin is warm and dry.  Nursing note and vitals reviewed.    ED Treatments / Results  Labs (all labs ordered are listed, but only abnormal results are displayed) Labs Reviewed  CBC WITH  DIFFERENTIAL/PLATELET - Abnormal; Notable for the following:       Result Value   WBC 10.8 (*)    RBC 3.74 (*)    Hemoglobin 10.1 (*)    HCT 31.3 (*)    Lymphs Abs 5.3 (*)    All other components within normal limits  BASIC METABOLIC PANEL - Abnormal; Notable for the following:    Potassium 3.4 (*)  Chloride 112 (*)    CO2 19 (*)    Glucose, Bld 198 (*)    All other components within normal limits  I-STAT CG4 LACTIC ACID, ED - Abnormal; Notable for the following:    Lactic Acid, Venous 3.15 (*)    All other components within normal limits  I-STAT CG4 LACTIC ACID, ED - Abnormal; Notable for the following:    Lactic Acid, Venous 2.30 (*)    All other components within normal limits  CULTURE, BLOOD (ROUTINE X 2)  CULTURE, BLOOD (ROUTINE X 2)  CULTURE, RESPIRATORY (NON-EXPECTORATED)  I-STAT BETA HCG BLOOD, ED ([facility], [facility], [facility] ONLY)    EKG  EKG Interpretation  Date/Time:  Tuesday June 16 2017 20:03:59 EDT Ventricular Rate:  101 PR Interval:    QRS Duration: 108 QT Interval:  367 QTC Calculation: 476 R Axis:   44 Text Interpretation:  Sinus tachycardia Low voltage, precordial leads Abnormal R-wave progression, early transition Baseline wander in lead(s) II aVR aVF No significant change since last tracing Confirmed by Blanchie Dessert [phone]) on 06/16/2017 8:32:53 PM       Radiology Dg Chest Portable 1 View  Result Date: 06/16/2017 CLINICAL DATA:  Shortness of breath EXAM: PORTABLE CHEST 1 VIEW COMPARISON:  CT 06/02/2017, radiograph 06/02/2017 FINDINGS: Tracheostomy tube is in place. No focal pulmonary infiltrate or effusion. Stable cardiomediastinal silhouette. No pneumothorax. IMPRESSION: No active disease. Electronically Signed   By: Donavan Foil M.D.   On: 06/16/2017 21:04    Procedures Procedures (including critical care time)  Medications Ordered in ED Medications  oxyCODONE-acetaminophen (PERCOCET/ROXICET) 5-325 MG per tablet 2 tablet (2 tablets Oral Given  06/16/17 2100)  lactated ringers bolus 2,000 mL (0 mLs Intravenous Stopped 06/17/17 0107)  ciprofloxacin (CIPRO) tablet 500 mg (500 mg Oral Given 06/17/17 0148)     Initial Impression / Assessment and Plan / ED Course  I have reviewed the triage vital signs and the nursing notes.  Pertinent labs & imaging results that were available during my care of the patient were reviewed by me and considered in my medical decision making (see chart for details).     48 yoF h/o asthma and subglottic stenosis s/p trach who p/w cough and trach site pain. No hemoptysis, though occasional pink tinged sputum. No significant increase in secretions. Lungs CTAB, no appreciable wheezes or decreased air movement. Pt coughing on exam. Nontoxic appearance.  CXR showing no active disease. WBC 10.8, downtrending from previous admission 2 weeks ago. Lactic acid elevated to 3.15. Possible relation to albuterol use. Pt appears euvolemic. IVF given. Repeat lactic acid downtrending to 2.3.  Suspect cough 2/2 trach site discomfort and irritation. Discomfort nearly resolved with percocet. No wheezing or decreased air movement suggestive of asthma exacerbation.  No evidence of pna on CXR. Pt endorses temp 100.4 and subjectively increased secretions. Will treat for tracheitis, previous trach aspirates sensitive to cipro. Provided tessalon, cipro, and prednisone (reduce localized trach site inflammation). [place] reviewed. Short course of percocet provided for followup with ENT and pain clinic. Pt informed no additional narcotics would be provided in ED.  Return precautions provided for worsening symptoms. Pt will f/u with PCP at first availability. Pt verbalized agreement with plan.  Pt care d/w Dr. Maryan Rued  Final Clinical Impressions(s) / ED Diagnoses   Final diagnoses:  Tracheitis  Tracheostomy present [facility])    New Prescriptions Discharge Medication List as of 06/17/2017  1:02 AM    START taking these medications    Details  benzonatate (TESSALON)  100 MG capsule Take 1 capsule (100 mg total) by mouth every 8 (eight) hours., Starting Wed 06/17/2017, Print    ciprofloxacin (CIPRO) 500 MG tablet Take 1 tablet (500 mg total) by mouth every 12 (twelve) hours., Starting Wed 06/17/2017, Until Wed 06/24/2017, Print    predniSONE (DELTASONE) 10 MG tablet Take 4 tablets (40 mg total) by mouth daily., Starting Wed 06/17/2017, Until Mon 06/22/2017, Print         Maylyn Narvaiz, Pilar Plate, MD 06/17/17 4370    Blanchie Dessert, MD 06/17/17 1424

## 2017-06-16 NOTE — ED Notes (Signed)
Patient assisted to use a bedside commode to urinate.

## 2017-06-16 NOTE — Progress Notes (Signed)
Pt came from triage complaining of pain around trach pt was here in ED a couple of weeks ago with same issue and was admitted.  She states she was sent home with 10 days worth of pain meds and has ran out and now is currently in pain again around tracheal area and throat.  No wheezing heard and min secretions noted. Placed pt on 40%ATC pt states she wears oxygen at home with trach and also eats a regular diet with trach and don't use a PMV speaking  device. [name] is cuffed and deflated at this time.

## 2017-06-16 NOTE — ED Triage Notes (Signed)
Pt c/o increased pain in her throat around her trach site beginning yesterday, increased secretions that are thicker than normal, pt states she has had to be suctioned more often than normal.

## 2017-06-16 NOTE — Patient Outreach (Signed)
Transition of care:  Placed call to patient and daughter answered phone.  Daughter reports that patient has a fever and is waiting for MD to call back. Reports that patient is complaining of a sore throat.  Daughter states that home health nurse has been to see patient. Reports temperature of 100.4 now.   PLAN: encouraged patient and or daughter to call 911 for any difficulty breathing.  Quickly got off the phone as daughter did not want to hold up the line as they are waiting for MD call.  Will continue to follow for transition of care.  Tomasa Rand, RN, BSN, CEN [facility] [employer] [phone]

## 2017-06-17 ENCOUNTER — Ambulatory Visit: Payer: Self-pay

## 2017-06-17 ENCOUNTER — Telehealth: Payer: Self-pay

## 2017-06-17 DIAGNOSIS — J0410 Acute tracheitis without obstruction: Secondary | ICD-10-CM | POA: Diagnosis not present

## 2017-06-17 DIAGNOSIS — J45901 Unspecified asthma with (acute) exacerbation: Secondary | ICD-10-CM | POA: Diagnosis not present

## 2017-06-17 DIAGNOSIS — I5032 Chronic diastolic (congestive) heart failure: Secondary | ICD-10-CM | POA: Diagnosis not present

## 2017-06-17 DIAGNOSIS — I130 Hypertensive heart and chronic kidney disease with heart failure and stage 1 through stage 4 chronic kidney disease, or unspecified chronic kidney disease: Secondary | ICD-10-CM | POA: Diagnosis not present

## 2017-06-17 DIAGNOSIS — J9611 Chronic respiratory failure with hypoxia: Secondary | ICD-10-CM | POA: Diagnosis not present

## 2017-06-17 DIAGNOSIS — J386 Stenosis of larynx: Secondary | ICD-10-CM | POA: Diagnosis not present

## 2017-06-17 DIAGNOSIS — J40 Bronchitis, not specified as acute or chronic: Secondary | ICD-10-CM | POA: Diagnosis not present

## 2017-06-17 LAB — I-STAT CG4 LACTIC ACID, ED: LACTIC ACID, VENOUS: 2.3 mmol/L — AB (ref 0.5–1.9)

## 2017-06-17 MED ORDER — OXYCODONE-ACETAMINOPHEN 5-325 MG PO TABS: 1.0000 | ORAL_TABLET | Freq: Three times a day (TID) | ORAL | 0 refills | Status: DC | PRN

## 2017-06-17 MED ORDER — BENZONATATE 100 MG PO CAPS: 100.0000 mg | ORAL_CAPSULE | Freq: Three times a day (TID) | ORAL | 0 refills | Status: DC

## 2017-06-17 MED ORDER — CIPROFLOXACIN HCL 500 MG PO TABS
500.0000 mg | ORAL_TABLET | Freq: Once | ORAL | Status: AC
  Administered 2017-06-17: 500 mg via ORAL
  Filled 2017-06-17: qty 1

## 2017-06-17 MED ORDER — PREDNISONE 10 MG PO TABS: 40.0000 mg | ORAL_TABLET | Freq: Every day | ORAL | 0 refills | Status: AC

## 2017-06-17 MED ORDER — TIZANIDINE HCL 4 MG PO TABS: ORAL_TABLET | ORAL | Status: DC

## 2017-06-17 MED ORDER — CIPROFLOXACIN HCL 500 MG PO TABS
500.0000 mg | ORAL_TABLET | Freq: Two times a day (BID) | ORAL | 0 refills | Status: AC
Start: 2017-06-17 — End: 2017-06-24

## 2017-06-17 NOTE — Telephone Encounter (Signed)
Attempted to contact the patient/husband to check on her after her ED visit yesterday and to inquire if she has picked up her glucometer as well as remind her of her appointment at [facility] tomorrow - 06/18/17 @ 0945. Call placed to [phone] (M) and a HIPAA compliant voicemail message was left requesting a call back to # [phone]

## 2017-06-18 ENCOUNTER — Telehealth: Payer: Self-pay

## 2017-06-18 ENCOUNTER — Encounter: Payer: Self-pay | Admitting: Family Medicine

## 2017-06-18 ENCOUNTER — Ambulatory Visit: Payer: Medicare Other | Attending: Family Medicine | Admitting: Family Medicine

## 2017-06-18 ENCOUNTER — Other Ambulatory Visit: Payer: Self-pay | Admitting: Pharmacist

## 2017-06-18 VITALS — BP 144/95 | HR 90 | Temp 98.6°F | Ht 61.0 in | Wt 231.8 lb

## 2017-06-18 DIAGNOSIS — G4733 Obstructive sleep apnea (adult) (pediatric): Secondary | ICD-10-CM | POA: Insufficient documentation

## 2017-06-18 DIAGNOSIS — Z23 Encounter for immunization: Secondary | ICD-10-CM

## 2017-06-18 DIAGNOSIS — E876 Hypokalemia: Secondary | ICD-10-CM | POA: Diagnosis not present

## 2017-06-18 DIAGNOSIS — G8929 Other chronic pain: Secondary | ICD-10-CM | POA: Diagnosis not present

## 2017-06-18 DIAGNOSIS — J0410 Acute tracheitis without obstruction: Secondary | ICD-10-CM

## 2017-06-18 DIAGNOSIS — F329 Major depressive disorder, single episode, unspecified: Secondary | ICD-10-CM | POA: Insufficient documentation

## 2017-06-18 DIAGNOSIS — J386 Stenosis of larynx: Secondary | ICD-10-CM | POA: Insufficient documentation

## 2017-06-18 DIAGNOSIS — J4541 Moderate persistent asthma with (acute) exacerbation: Secondary | ICD-10-CM | POA: Diagnosis not present

## 2017-06-18 DIAGNOSIS — K219 Gastro-esophageal reflux disease without esophagitis: Secondary | ICD-10-CM | POA: Insufficient documentation

## 2017-06-18 DIAGNOSIS — I10 Essential (primary) hypertension: Secondary | ICD-10-CM | POA: Insufficient documentation

## 2017-06-18 DIAGNOSIS — I130 Hypertensive heart and chronic kidney disease with heart failure and stage 1 through stage 4 chronic kidney disease, or unspecified chronic kidney disease: Secondary | ICD-10-CM | POA: Diagnosis not present

## 2017-06-18 DIAGNOSIS — Z930 Tracheostomy status: Secondary | ICD-10-CM | POA: Insufficient documentation

## 2017-06-18 DIAGNOSIS — J45901 Unspecified asthma with (acute) exacerbation: Secondary | ICD-10-CM | POA: Diagnosis not present

## 2017-06-18 DIAGNOSIS — I5032 Chronic diastolic (congestive) heart failure: Secondary | ICD-10-CM | POA: Diagnosis not present

## 2017-06-18 DIAGNOSIS — R079 Chest pain, unspecified: Secondary | ICD-10-CM | POA: Diagnosis not present

## 2017-06-18 DIAGNOSIS — J40 Bronchitis, not specified as acute or chronic: Secondary | ICD-10-CM | POA: Diagnosis not present

## 2017-06-18 DIAGNOSIS — E119 Type 2 diabetes mellitus without complications: Secondary | ICD-10-CM | POA: Diagnosis present

## 2017-06-18 DIAGNOSIS — M549 Dorsalgia, unspecified: Secondary | ICD-10-CM | POA: Diagnosis not present

## 2017-06-18 DIAGNOSIS — Z794 Long term (current) use of insulin: Secondary | ICD-10-CM | POA: Diagnosis not present

## 2017-06-18 DIAGNOSIS — E1142 Type 2 diabetes mellitus with diabetic polyneuropathy: Secondary | ICD-10-CM | POA: Diagnosis not present

## 2017-06-18 DIAGNOSIS — J9611 Chronic respiratory failure with hypoxia: Secondary | ICD-10-CM | POA: Diagnosis not present

## 2017-06-18 LAB — GLUCOSE, POCT (MANUAL RESULT ENTRY): POC GLUCOSE: 235 mg/dL — AB (ref 70–99)

## 2017-06-18 MED ORDER — ACETAMINOPHEN-CODEINE #3 300-30 MG PO TABS: 1.0000 | ORAL_TABLET | Freq: Two times a day (BID) | ORAL | 0 refills | Status: DC | PRN

## 2017-06-18 MED ORDER — BENZONATATE 100 MG PO CAPS: 100.0000 mg | ORAL_CAPSULE | Freq: Three times a day (TID) | ORAL | 0 refills | Status: DC

## 2017-06-18 MED ORDER — ACCU-CHEK SOFTCLIX LANCETS MISC: 12 refills | Status: DC

## 2017-06-18 NOTE — Telephone Encounter (Signed)
Met with the patient and her husband when she was in the clinic today for her appointment. She stated that [facility] nurse has started. She also noted that she has picked up the glucometer but needed new lancets as they did not fit her lancing device. Stacey Hammer, RPH placed order for accu-chek softclix lancets.   As per Dr Amao, the patient did not qualify for O2 and did not want O2 because [facility] told her that she needed to pay $1200. The patient has since had the O2 removed from her home.  Call placed to Stephanie Wesoly, [facility] #[phone] to inquire about the $1200 that the patient stated she would need to pay for O2. Stephanie explained that she is working on re-qualifying the patient for O2 as the initial referral was incomplete. She stated that the patient does not have to pay anything and does not owe anything. She noted that the patient was given an Advanced Beneficiary Notice explaining financial responsibility for the O2 if the insurance didn't pay.  She further stated that there is nothing about $1200. The patient could possibly have been responsible for about $200/month.  She then confirmed that the O2 hs been picked up from the patient's home. This CM informed her that the patient did not qualify for O2 when she was in the clinic today.  Update provided to Dr Amao.    

## 2017-06-18 NOTE — Patient Instructions (Signed)

## 2017-06-18 NOTE — Progress Notes (Signed)
[place]  Date of Telephone Encounter: 06/10/17  Admit Date: 06/02/17 Discharge Date: 06/09/17   Subjective:  Patient ID: [name], female    DOB: 06-12-1971  Age: 46 y.o. MRN: [id number]  CC: Diabetes   [name] is a 46 year old female with a history of type 2 diabetes mellitus (A1c 8.3 up from 7.4), hypertension, obstructive sleep apnea, asthma, depression, s/p tracheostomy secondary to intubation related tracheal injury, subglottic stenosis (status post balloon dilatation and Kenalog injection of stenosis tracheostomy tube exchange by ENT at [facility]) who presents today for follow-up at the transitional care clinic.  She was hospitalized from 06/02/17 through 06/09/17 for acute hypoxic respiratory failure  Secondary to acute asthma exacerbation which had not responded to MDIs and use the nebulizer treatments at home. Chest x-ray reveals no active cardiopulmonary process, CT inject the chest was negative for PE. She was placed on steroids, bronchodilators, supplemental oxygen, analgesics, antitussives with improvement in her symptoms. She also had mild acute kidney injury which improved with IV fluids and NSAIDs were placed on hold.  She again presented to the ED 2 days ago with symptoms concerning for tracheitis for which she was placed on ciprofloxacin.  She reports doing well, denies fevers, cough has improved with the use of Tessalon Perles however she does have pain at the trach site whenever she coughs and also be running out of her Percocet. She no longer needs oxygen as she is saturating at 97% on room air; informs me that [facility] had told her she would pay $1200 prior to being reinstated on oxygen and she had asked them to come get the oxygen tanks which she no longer needs. She has an upcoming follow-up appointment with ENT at [facility]  Past Medical History:  Diagnosis Date  . Arthritis   . Asthma     . Chronic back pain   . Chronic chest pain   . Depression   . DM (diabetes mellitus) ([place])    INSULIN DEPENDENT  . GERD (gastroesophageal reflux disease)   . Headache(784.0)   . Hypertension   . Hypokalemia   . Respiratory disease 05/2017  . Tracheostomy in place [facility]) 02/2017    Past Surgical History:  Procedure Laterality Date  . APPENDECTOMY    . CESAREAN SECTION     x 3  . CHOLECYSTECTOMY N/A 03/02/2014   Procedure: LAPAROSCOPIC CHOLECYSTECTOMY;  Surgeon: Joyice Faster. Cornett, MD;  Location: [place];  Service: General;  Laterality: N/A;  . HERNIA REPAIR    . PANENDOSCOPY N/A 03/04/2017   Procedure: PANENDOSCOPY WITH POSSIBLE FOREIGN BODY REMOVAL;  Surgeon: Jodi Marble, MD;  Location: [place];  Service: ENT;  Laterality: N/A;  . ROTATOR CUFF REPAIR    . TRACHEOSTOMY  02/2017  . VESICOVAGINAL FISTULA CLOSURE W/ TAH  2009    Allergies  Allergen Reactions  . Reglan [Metoclopramide] Other (See Comments)    Panic attack     Outpatient Medications Prior to Visit  Medication Sig Dispense Refill  . albuterol (PROVENTIL HFA;VENTOLIN HFA) 108 (90 Base) MCG/ACT inhaler Inhale 2 puffs into the lungs every 6 (six) hours as needed for wheezing or shortness of breath. 1 Inhaler 2  . albuterol (PROVENTIL) (2.5 MG/3ML) 0.083% nebulizer solution Take 3 mLs (2.5 mg total) by nebulization every 6 (six) hours as needed for wheezing or shortness of breath. Reported on 02/22/2016 75 mL 3  . amLODipine (NORVASC) 10 MG tablet Take 1 tablet (10 mg total) by  mouth daily. 90 tablet 0  . Artificial Tear Ointment (ARTIFICIAL TEARS) ointment Place 1 application into both eyes at bedtime.     Marland Kitchen atorvastatin (LIPITOR) 20 MG tablet Take 1 tablet (20 mg total) by mouth daily. 90 tablet 0  . Blood Glucose Monitoring Suppl (ACCU-CHEK AVIVA PLUS) w/Device KIT Used to check blood sugars once daily. E11.9 1 kit 0  . carvedilol (COREG) 6.25 MG tablet Take 1 tablet (6.25 mg total) by mouth 2 (two) times daily with a  meal. 180 tablet 0  . chlorhexidine gluconate, MEDLINE KIT, (PERIDEX) 0.12 % solution 15 mLs by Mouth Rinse route 2 (two) times daily. 120 mL 0  . ciprofloxacin (CIPRO) 500 MG tablet Take 1 tablet (500 mg total) by mouth every 12 (twelve) hours. 14 tablet 0  . Fluticasone-Salmeterol (ADVAIR DISKUS) 250-50 MCG/DOSE AEPB Inhale 1 puff into the lungs 2 (two) times daily. 180 each 0  . gabapentin (NEURONTIN) 300 MG capsule Take 1 capsule (300 mg total) by mouth 3 (three) times daily. 90 capsule 5  . Insulin Glargine (LANTUS SOLOSTAR) 100 UNIT/ML Solostar Pen Inject 25 Units into the skin at bedtime.    . insulin lispro (HUMALOG) 100 UNIT/ML injection Inject 8 Units into the skin 3 (three) times daily with meals.    . Lancets (ACCU-CHEK SOFT TOUCH) lancets Use as instructed once daily. E11.9 100 each 12  . metFORMIN (GLUCOPHAGE-XR) 500 MG 24 hr tablet Take 1 tablet (500 mg total) by mouth daily with breakfast. 90 tablet 3  . methocarbamol (ROBAXIN) 500 MG tablet Take 500 mg by mouth every 8 (eight) hours as needed for muscle spasms.    Marland Kitchen NOVOLOG FLEXPEN 100 UNIT/ML FlexPen as directed.  0  . oxyCODONE-acetaminophen (PERCOCET/ROXICET) 5-325 MG tablet Take 1 tablet by mouth every 8 (eight) hours as needed for severe pain. 10 tablet 0  . pantoprazole (PROTONIX) 40 MG tablet Take 40 mg by mouth daily.     . predniSONE (DELTASONE) 10 MG tablet Take 4 tablets (40 mg total) by mouth daily. 20 tablet 0  . sertraline (ZOLOFT) 100 MG tablet Place 1 tablet (100 mg total) into feeding tube daily. 30 tablet 5  . SUMAtriptan (IMITREX) 100 MG tablet Take 100 mg by mouth daily as needed. For migraine    . tiZANidine (ZANAFLEX) 4 MG tablet HOLD WHILE TAKING CIPROFLOXACIN    . topiramate (TOPAMAX) 100 MG tablet Take 1 tablet (100 mg total) by mouth 2 (two) times daily. 180 tablet 0  . benzonatate (TESSALON) 100 MG capsule Take 1 capsule (100 mg total) by mouth every 8 (eight) hours. 21 capsule 0  . Dulaglutide 1.5  MG/0.5ML SOPN Inject 1.5 mg into the skin once a week. (Patient not taking: Reported on 06/12/2017) 4 pen 11  . glucose blood (ACCU-CHEK AVIVA) test strip Use as directed to check blood sugars once daily. E11.9 (Patient not taking: Reported on 06/12/2017) 100 each 12  . sodium chloride HYPERTONIC 3 % nebulizer solution Take 4 mLs by nebulization 2 (two) times daily. (Patient not taking: Reported on 06/12/2017) 750 mL 0  . chlorpheniramine-HYDROcodone (TUSSIONEX PENNKINETIC ER) 10-8 MG/5ML SUER Take 5 mLs by mouth every 12 (twelve) hours as needed for cough. (Patient not taking: Reported on 06/12/2017) 140 mL 0  . guaiFENesin-dextromethorphan (ROBITUSSIN DM) 100-10 MG/5ML syrup Take 10 mLs by mouth every 4 (four) hours as needed for cough. (Patient not taking: Reported on 06/12/2017) 118 mL 1   No facility-administered medications prior to visit.  ROS Review of Systems  Constitutional: Negative for activity change, appetite change and fatigue.  HENT: Negative for congestion, sinus pressure and sore throat.   Eyes: Negative for visual disturbance.  Respiratory: Negative for cough, chest tightness, shortness of breath and wheezing.   Cardiovascular: Negative for chest pain and palpitations.  Gastrointestinal: Negative for abdominal distention, abdominal pain and constipation.  Endocrine: Negative for polydipsia.  Genitourinary: Negative for dysuria and frequency.  Musculoskeletal: Positive for neck pain (at the site of trach). Negative for arthralgias and back pain.  Skin: Negative for rash.  Neurological: Negative for tremors, light-headedness and numbness.  Hematological: Does not bruise/bleed easily.  Psychiatric/Behavioral: Negative for agitation and behavioral problems.    Objective:  BP (!) 144/95   Pulse 90   Temp 98.6 F (37 C) (Oral)   Ht 5' 1"  (1.549 m)   Wt 231 lb 12.8 oz (105.1 kg)   SpO2 97%   BMI 43.80 kg/m   BP/Weight 06/18/2017 06/17/2017 0/37/0488  Systolic BP 891 694  503  Diastolic BP 95 78 74  Wt. (Lbs) 231.8 - 245.4  BMI 43.8 - 46.37      Physical Exam  Constitutional: She is oriented to person, place, and time. She appears well-developed and well-nourished.  Neck:  Tracheostomy in place  Cardiovascular: Normal rate, normal heart sounds and intact distal pulses.   No murmur heard. Pulmonary/Chest: Effort normal and breath sounds normal. She has no wheezes. She has no rales. She exhibits no tenderness.  Abdominal: Soft. Bowel sounds are normal. She exhibits no distension and no mass. There is no tenderness.  Musculoskeletal: Normal range of motion.  Neurological: She is alert and oriented to person, place, and time.     Lab Results  Component Value Date   HGBA1C 8.3 (H) 06/02/2017    Assessment & Plan:   1. Type 2 diabetes mellitus with diabetic polyneuropathy, without long-term current use of insulin (HCC) Uncontrolled with A1c of 8.3 Suboptimal control due to repeated steroid use Patient advised to increase metformin dose to twice daily dosing if sugars are not at goal. Keep blood sugar logs with fasting goals of 80-120 mg/dl, random of less than 180 and in the event of sugars less than 60 mg/dl or greater than 400 mg/dl please notify the clinic ASAP. It is recommended that you undergo annual eye exams and annual foot exams. Pneumovax is recommended every 5 years before the age of 29 and once for a lifetime at or after the age of 50. Keep appointment with endocrine - POCT glucose (manual entry)  2. Essential hypertension Slightly elevated above goal of less than 130/80 Will increase dose of antihypertensive if still elevated at next visit Low-sodium, DASH diet  3. Tracheostomy status (HCC) Stable Prescribed Tylenol 3 for pain at the site Advised to obtain subsequent narcotic prescriptions from her ENT. Currently does not need oxygen as she is saturating at 97% on RA -will inform [facility] of same  4. Sleep apnea,  obstructive Use CPAP at night  5. Moderate persistent asthma with exacerbation No recent exacerbation  6. Tracheitis Currently on ciprofloxacin Keep appointment with ENT at [facility] - benzonatate (TESSALON) 100 MG capsule; Take 1 capsule (100 mg total) by mouth every 8 (eight) hours.  Dispense: 21 capsule; Refill: 0 - acetaminophen-codeine (TYLENOL #3) 300-30 MG tablet; Take 1 tablet by mouth every 12 (twelve) hours as needed for moderate pain.  Dispense: 30 tablet; Refill: 0   Meds ordered this encounter  Medications  .  benzonatate (TESSALON) 100 MG capsule    Sig: Take 1 capsule (100 mg total) by mouth every 8 (eight) hours.    Dispense:  21 capsule    Refill:  0  . acetaminophen-codeine (TYLENOL #3) 300-30 MG tablet    Sig: Take 1 tablet by mouth every 12 (twelve) hours as needed for moderate pain.    Dispense:  30 tablet    Refill:  0    Follow-up: Return in about 2 weeks (around 07/02/2017) for TCC -Follow-up on her chronic medical conditions.   Arnoldo Morale MD

## 2017-06-19 DIAGNOSIS — Z23 Encounter for immunization: Secondary | ICD-10-CM | POA: Diagnosis not present

## 2017-06-19 DIAGNOSIS — I5032 Chronic diastolic (congestive) heart failure: Secondary | ICD-10-CM | POA: Diagnosis not present

## 2017-06-19 DIAGNOSIS — I130 Hypertensive heart and chronic kidney disease with heart failure and stage 1 through stage 4 chronic kidney disease, or unspecified chronic kidney disease: Secondary | ICD-10-CM | POA: Diagnosis not present

## 2017-06-19 DIAGNOSIS — J40 Bronchitis, not specified as acute or chronic: Secondary | ICD-10-CM | POA: Diagnosis not present

## 2017-06-19 DIAGNOSIS — J45901 Unspecified asthma with (acute) exacerbation: Secondary | ICD-10-CM | POA: Diagnosis not present

## 2017-06-19 DIAGNOSIS — J386 Stenosis of larynx: Secondary | ICD-10-CM | POA: Diagnosis not present

## 2017-06-19 DIAGNOSIS — J9611 Chronic respiratory failure with hypoxia: Secondary | ICD-10-CM | POA: Diagnosis not present

## 2017-06-21 LAB — CULTURE, BLOOD (ROUTINE X 2)
Culture: NO GROWTH
Culture: NO GROWTH
SPECIAL REQUESTS: ADEQUATE
SPECIAL REQUESTS: ADEQUATE

## 2017-06-22 DIAGNOSIS — J45901 Unspecified asthma with (acute) exacerbation: Secondary | ICD-10-CM | POA: Diagnosis not present

## 2017-06-22 DIAGNOSIS — I5032 Chronic diastolic (congestive) heart failure: Secondary | ICD-10-CM | POA: Diagnosis not present

## 2017-06-22 DIAGNOSIS — J9611 Chronic respiratory failure with hypoxia: Secondary | ICD-10-CM | POA: Diagnosis not present

## 2017-06-22 DIAGNOSIS — J40 Bronchitis, not specified as acute or chronic: Secondary | ICD-10-CM | POA: Diagnosis not present

## 2017-06-22 DIAGNOSIS — J386 Stenosis of larynx: Secondary | ICD-10-CM | POA: Diagnosis not present

## 2017-06-22 DIAGNOSIS — I130 Hypertensive heart and chronic kidney disease with heart failure and stage 1 through stage 4 chronic kidney disease, or unspecified chronic kidney disease: Secondary | ICD-10-CM | POA: Diagnosis not present

## 2017-06-23 ENCOUNTER — Other Ambulatory Visit: Payer: Self-pay

## 2017-06-23 DIAGNOSIS — I5032 Chronic diastolic (congestive) heart failure: Secondary | ICD-10-CM | POA: Diagnosis not present

## 2017-06-23 DIAGNOSIS — I130 Hypertensive heart and chronic kidney disease with heart failure and stage 1 through stage 4 chronic kidney disease, or unspecified chronic kidney disease: Secondary | ICD-10-CM | POA: Diagnosis not present

## 2017-06-23 DIAGNOSIS — J45901 Unspecified asthma with (acute) exacerbation: Secondary | ICD-10-CM | POA: Diagnosis not present

## 2017-06-23 DIAGNOSIS — J40 Bronchitis, not specified as acute or chronic: Secondary | ICD-10-CM | POA: Diagnosis not present

## 2017-06-23 DIAGNOSIS — J9611 Chronic respiratory failure with hypoxia: Secondary | ICD-10-CM | POA: Diagnosis not present

## 2017-06-23 DIAGNOSIS — J386 Stenosis of larynx: Secondary | ICD-10-CM | POA: Diagnosis not present

## 2017-06-23 NOTE — Patient Outreach (Signed)
Transition of care call: Placed call to patient and spoke with husband ( is on consent) . Husband reports that patient is doing much better now. Reports she continues to take antibiotics.  Reports patient has ENT follow up at [facility] tomorrow. Confirmed transportation.   PLAN: Offered home visit for Monday 06/29/2017.  Accepted. Confirmed address.  Tomasa Rand, RN, BSN, CEN [facility] [employer] [phone]

## 2017-06-24 DIAGNOSIS — Z794 Long term (current) use of insulin: Secondary | ICD-10-CM | POA: Diagnosis not present

## 2017-06-24 DIAGNOSIS — E119 Type 2 diabetes mellitus without complications: Secondary | ICD-10-CM | POA: Diagnosis not present

## 2017-06-24 DIAGNOSIS — Z7951 Long term (current) use of inhaled steroids: Secondary | ICD-10-CM | POA: Diagnosis not present

## 2017-06-24 DIAGNOSIS — Z87891 Personal history of nicotine dependence: Secondary | ICD-10-CM | POA: Diagnosis not present

## 2017-06-24 DIAGNOSIS — Z930 Tracheostomy status: Secondary | ICD-10-CM | POA: Diagnosis not present

## 2017-06-24 DIAGNOSIS — I5030 Unspecified diastolic (congestive) heart failure: Secondary | ICD-10-CM | POA: Diagnosis not present

## 2017-06-24 DIAGNOSIS — J45909 Unspecified asthma, uncomplicated: Secondary | ICD-10-CM | POA: Diagnosis not present

## 2017-06-24 DIAGNOSIS — J386 Stenosis of larynx: Secondary | ICD-10-CM | POA: Diagnosis not present

## 2017-06-24 DIAGNOSIS — Z79899 Other long term (current) drug therapy: Secondary | ICD-10-CM | POA: Diagnosis not present

## 2017-06-24 DIAGNOSIS — I110 Hypertensive heart disease with heart failure: Secondary | ICD-10-CM | POA: Diagnosis not present

## 2017-06-26 ENCOUNTER — Telehealth: Payer: Self-pay | Admitting: Family Medicine

## 2017-06-26 DIAGNOSIS — I130 Hypertensive heart and chronic kidney disease with heart failure and stage 1 through stage 4 chronic kidney disease, or unspecified chronic kidney disease: Secondary | ICD-10-CM | POA: Diagnosis not present

## 2017-06-26 DIAGNOSIS — I5032 Chronic diastolic (congestive) heart failure: Secondary | ICD-10-CM | POA: Diagnosis not present

## 2017-06-26 DIAGNOSIS — J386 Stenosis of larynx: Secondary | ICD-10-CM | POA: Diagnosis not present

## 2017-06-26 DIAGNOSIS — J45901 Unspecified asthma with (acute) exacerbation: Secondary | ICD-10-CM | POA: Diagnosis not present

## 2017-06-26 DIAGNOSIS — J40 Bronchitis, not specified as acute or chronic: Secondary | ICD-10-CM | POA: Diagnosis not present

## 2017-06-26 DIAGNOSIS — I10 Essential (primary) hypertension: Secondary | ICD-10-CM

## 2017-06-26 DIAGNOSIS — J9611 Chronic respiratory failure with hypoxia: Secondary | ICD-10-CM | POA: Diagnosis not present

## 2017-06-26 MED ORDER — CARVEDILOL 12.5 MG PO TABS: 12.5000 mg | ORAL_TABLET | Freq: Two times a day (BID) | ORAL | 3 refills | Status: DC

## 2017-06-26 NOTE — Telephone Encounter (Addendum)
Call placed to patient [phone] to check on her status. Spoke with home health nurse Claiborne Billings O'brien from [facility] and call was placed on speaker so patient can also listen. Claiborne Billings informed that patient has not been feeling well. Patient has been coughing, had fever, her bp was high and patient's breathing was not good. Claiborne Billings reported patient's bp as 180/90 and temperature at 98.4. Informed Claiborne Billings that I will be giving PCP an update.   Claiborne Billings was inquiring about patient's medication (budesonide 0.5mg  and sumatriptan 100mg ) that was on [facility] ENT's medication list but patient wasn't given refills for. Patient had an appointment with ENT yesterday. I informed Claiborne Billings that I would be updating PCP about this and patient's status. Asked Claiborne Billings to contact the [place] [facility] office if she had further questions.  Informed Claiborne Billings that [facility] nurse will be going to patient's house on Monday 10/8, and that patient has an appointment with Dr. Jarold Song on 10/11 at [place]. I asked patient to bring her blood sugar log with her on that appointment.

## 2017-06-26 NOTE — Telephone Encounter (Signed)
Please inform her to increase carvedilol to 12.5 mg twice daily (take 2 of her 6.25 mg tablets). I would recommend doing an albuterol nebulizer treatment and if she is still short of breath she might have to go to the ED. I do not see budesonide on her med list. Also inquire if she is out of her sumatriptan pills, does she have a migraine as this could cause elevation in her blood pressure.

## 2017-06-26 NOTE — Telephone Encounter (Signed)
Message delivered to patient. Given plenty off opportunity to ask questions if needed. Pt verbalized understanding about medication adjustment: Carvedilol 12.5 mg BID. Verbalized understanding to go to ED if not better after albuterol nebulizer treatment. She states she took sumatriptan last night and aware that this could've increased her BP.

## 2017-06-29 ENCOUNTER — Other Ambulatory Visit: Payer: Self-pay

## 2017-06-29 NOTE — Patient Outreach (Signed)
[facility]) Care Management  06/29/2017  [name] March 10, 1971 [id number]   2:30pm  Arrived for initial home visit.  Patient sitting outside on porch.  Invited in.  Explained to patient about [facility] program and transition of care.  Reviewed consent.  Patient with trach talking in a whisper.  Patient reports that she is doing well. Reports that she is active with home health 2 days per week. Reports that she has good social support with her husband.  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  Patient reports that she does not feel like she needs the [facility] program and has declined services.  PLAN: will respectfully close case as patient denies needs. Offered to leave [facility] new patient packet, calendar with resources and DM packet. Also left my contact card and [facility] 24 hour nurse line magnet. Encouraged patient to call for needs in the future and she agreed. Reviewed this information with her husband [name] who also was in agreement to close case.  Will notify primary MD and send patient a case closure letter.  Tomasa Rand, RN, BSN, CEN [facility] [employer] [phone]

## 2017-07-01 ENCOUNTER — Telehealth: Payer: Self-pay

## 2017-07-01 NOTE — Telephone Encounter (Signed)
Call placed to the patient and confirmed her appointment at [facility] tomorrow @ 1000.  She reported that she currently is experiencing a migraine with head and neck pain -neck pain located around her tracheostomy. She stated that she did not have any symptoms other than the head/neck pain.  She said that she took a sumatriptin about an hour ago and hopes to feel relief " soon."  She stated that she was instructed to take it twice a day as needed. She did not report any need to seek medical attention in the ED at this time.  Informed her that update would be provided to Dr Jarold Song.

## 2017-07-02 ENCOUNTER — Telehealth: Payer: Self-pay | Admitting: Family Medicine

## 2017-07-02 ENCOUNTER — Encounter (HOSPITAL_COMMUNITY): Payer: Self-pay | Admitting: Emergency Medicine

## 2017-07-02 ENCOUNTER — Other Ambulatory Visit: Payer: Self-pay

## 2017-07-02 ENCOUNTER — Ambulatory Visit: Payer: Medicare Other | Admitting: Family Medicine

## 2017-07-02 ENCOUNTER — Emergency Department (HOSPITAL_COMMUNITY): Payer: Medicare Other

## 2017-07-02 ENCOUNTER — Emergency Department (HOSPITAL_COMMUNITY)
Admission: EM | Admit: 2017-07-02 | Discharge: 2017-07-03 | Disposition: A | Discharge status: Home / Self Care | Payer: Medicare Other | Attending: Emergency Medicine | Admitting: Emergency Medicine

## 2017-07-02 DIAGNOSIS — J45909 Unspecified asthma, uncomplicated: Secondary | ICD-10-CM | POA: Insufficient documentation

## 2017-07-02 DIAGNOSIS — E1165 Type 2 diabetes mellitus with hyperglycemia: Secondary | ICD-10-CM | POA: Diagnosis not present

## 2017-07-02 DIAGNOSIS — R059 Cough, unspecified: Secondary | ICD-10-CM

## 2017-07-02 DIAGNOSIS — I10 Essential (primary) hypertension: Secondary | ICD-10-CM | POA: Insufficient documentation

## 2017-07-02 DIAGNOSIS — J45901 Unspecified asthma with (acute) exacerbation: Secondary | ICD-10-CM | POA: Diagnosis not present

## 2017-07-02 DIAGNOSIS — Z87891 Personal history of nicotine dependence: Secondary | ICD-10-CM | POA: Insufficient documentation

## 2017-07-02 DIAGNOSIS — R519 Headache, unspecified: Secondary | ICD-10-CM

## 2017-07-02 DIAGNOSIS — J9611 Chronic respiratory failure with hypoxia: Secondary | ICD-10-CM | POA: Diagnosis not present

## 2017-07-02 DIAGNOSIS — J40 Bronchitis, not specified as acute or chronic: Secondary | ICD-10-CM | POA: Diagnosis not present

## 2017-07-02 DIAGNOSIS — I5032 Chronic diastolic (congestive) heart failure: Secondary | ICD-10-CM | POA: Diagnosis not present

## 2017-07-02 DIAGNOSIS — I130 Hypertensive heart and chronic kidney disease with heart failure and stage 1 through stage 4 chronic kidney disease, or unspecified chronic kidney disease: Secondary | ICD-10-CM | POA: Diagnosis not present

## 2017-07-02 DIAGNOSIS — R51 Headache: Secondary | ICD-10-CM | POA: Insufficient documentation

## 2017-07-02 DIAGNOSIS — R05 Cough: Secondary | ICD-10-CM | POA: Insufficient documentation

## 2017-07-02 DIAGNOSIS — J386 Stenosis of larynx: Secondary | ICD-10-CM | POA: Diagnosis not present

## 2017-07-02 LAB — COMPREHENSIVE METABOLIC PANEL
ALBUMIN: 3.4 g/dL — AB (ref 3.5–5.0)
ALK PHOS: 94 U/L (ref 38–126)
ALT: 18 U/L (ref 14–54)
ANION GAP: 11 (ref 5–15)
AST: 17 U/L (ref 15–41)
BUN: 14 mg/dL (ref 6–20)
CO2: 20 mmol/L — ABNORMAL LOW (ref 22–32)
Calcium: 9.3 mg/dL (ref 8.9–10.3)
Chloride: 106 mmol/L (ref 101–111)
Creatinine, Ser: 1.2 mg/dL — ABNORMAL HIGH (ref 0.44–1.00)
GFR calc Af Amer: >60 mL/min (ref >60)
GFR calc non Af Amer: 53 mL/min — ABNORMAL LOW (ref >60)
GLUCOSE: 322 mg/dL — AB (ref 65–99)
POTASSIUM: 3.8 mmol/L (ref 3.5–5.1)
SODIUM: 137 mmol/L (ref 135–145)
Total Bilirubin: 0.5 mg/dL (ref 0.3–1.2)
Total Protein: 7.2 g/dL (ref 6.5–8.1)

## 2017-07-02 LAB — CBC WITH DIFFERENTIAL/PLATELET
BASOS ABS: 0 10*3/uL (ref 0.0–0.1)
BASOS PCT: 0 %
EOS ABS: 0.1 10*3/uL (ref 0.0–0.7)
Eosinophils Relative: 1 %
HEMATOCRIT: 33.7 % — AB (ref 36.0–46.0)
HEMOGLOBIN: 10.9 g/dL — AB (ref 12.0–15.0)
Lymphocytes Relative: 46 %
Lymphs Abs: 5 10*3/uL — ABNORMAL HIGH (ref 0.7–4.0)
MCH: 26.8 pg (ref 26.0–34.0)
MCHC: 32.3 g/dL (ref 30.0–36.0)
MCV: 83 fL (ref 78.0–100.0)
MONOS PCT: 6 %
Monocytes Absolute: 0.6 10*3/uL (ref 0.1–1.0)
NEUTROS ABS: 5 10*3/uL (ref 1.7–7.7)
NEUTROS PCT: 47 %
Platelets: 363 10*3/uL (ref 150–400)
RBC: 4.06 MIL/uL (ref 3.87–5.11)
RDW: 15 % (ref 11.5–15.5)
WBC: 10.6 10*3/uL — ABNORMAL HIGH (ref 4.0–10.5)

## 2017-07-02 LAB — I-STAT TROPONIN, ED: Troponin i, poc: 0 ng/mL (ref 0.00–0.08)

## 2017-07-02 MED ORDER — PROCHLORPERAZINE EDISYLATE 5 MG/ML IJ SOLN
10.0000 mg | Freq: Once | INTRAMUSCULAR | Status: AC
  Administered 2017-07-02: 10 mg via INTRAVENOUS
  Filled 2017-07-02: qty 2

## 2017-07-02 MED ORDER — SUMATRIPTAN SUCCINATE 100 MG PO TABS
100.0000 mg | ORAL_TABLET | Freq: Once | ORAL | Status: AC
  Administered 2017-07-02: 100 mg via ORAL
  Filled 2017-07-02: qty 1

## 2017-07-02 MED ORDER — DIPHENHYDRAMINE HCL 50 MG/ML IJ SOLN
25.0000 mg | Freq: Once | INTRAMUSCULAR | Status: AC
  Administered 2017-07-02: 25 mg via INTRAVENOUS
  Filled 2017-07-02: qty 1

## 2017-07-02 MED ORDER — SODIUM CHLORIDE 0.9 % IV BOLUS (SEPSIS)
500.0000 mL | Freq: Once | INTRAVENOUS | Status: AC
  Administered 2017-07-02: 500 mL via INTRAVENOUS

## 2017-07-02 MED ORDER — GUAIFENESIN 100 MG/5ML PO SYRP: 100.0000 mg | ORAL_SOLUTION | Freq: Three times a day (TID) | ORAL | 0 refills | Status: DC | PRN

## 2017-07-02 MED ORDER — INSULIN GLARGINE 100 UNIT/ML ~~LOC~~ SOLN
7.0000 [IU] | Freq: Once | SUBCUTANEOUS | Status: AC
  Administered 2017-07-02: 7 [IU] via SUBCUTANEOUS
  Filled 2017-07-02: qty 0.07

## 2017-07-02 MED ORDER — ALBUTEROL SULFATE (2.5 MG/3ML) 0.083% IN NEBU
2.5000 mg | INHALATION_SOLUTION | Freq: Once | RESPIRATORY_TRACT | Status: AC
  Administered 2017-07-02: 2.5 mg via RESPIRATORY_TRACT
  Filled 2017-07-02: qty 3

## 2017-07-02 MED ORDER — GUAIFENESIN 100 MG/5ML PO SOLN
5.0000 mL | Freq: Once | ORAL | Status: AC
  Administered 2017-07-02: 100 mg via ORAL
  Filled 2017-07-02: qty 5

## 2017-07-02 MED ORDER — PREDNISONE 10 MG PO TABS: 20.0000 mg | ORAL_TABLET | Freq: Every day | ORAL | 0 refills | Status: AC

## 2017-07-02 MED ORDER — HYDROCODONE-HOMATROPINE 5-1.5 MG/5ML PO SYRP: 5.0000 mL | ORAL_SOLUTION | Freq: Four times a day (QID) | ORAL | 0 refills | Status: DC | PRN

## 2017-07-02 NOTE — ED Notes (Addendum)
Pt up to bsc without assistance Pt reports feeling much better

## 2017-07-02 NOTE — Discharge Instructions (Addendum)
Take your blood pressure medication as directed.   Take your metformin and insulin as directed.   Take Hycodan cough medication as directed.   Do not take your full dose of insulin tonight as directed.   Follow-up with your primary care doctor in the next 24-48 hours for further evaluation.   Return the emergency Department for any difficulty breathing, worsening cough, chest pain, headache, vomiting, numbness/weakness in her arms or legs, vision changes or any other worsening or concerning symptoms.

## 2017-07-02 NOTE — ED Provider Notes (Signed)
[facility] DEPT Provider Note   CSN: [id number] Arrival date & time: 07/02/17  1656     History   Chief Complaint Chief Complaint  Patient presents with  . Hypertension  . Headache    [name] is a 46 y.o. female with PMH/o DM, GERD, HTN who presents for HTN and headache. Patient reports that for the last few days, her blood pressure has been high. She reports that a home health nurse came today and found that her blood pressure was 180/110 prompting concern. Patient also reports that she has had a headache for the last 3 days. Patient does state she has a history of migraines and states this feels similar to her headache. She states it as a frontal headache and was gradual in onset. She has tried taking sumatriptan which she takes for her migraines with no improvement in symptoms. Patient denies any preceding trauma, injury, fall. Patient states that she is still able to move her arms and legs without any difficulty. His associated photophobia. Patient also complaining of chest pain and shortness of breath that began while in the lobby waiting in the emergency department. Patient things symptoms are caused by constant coughing. She states it is more of a soreness. Patient has a history of a trach secondary to difficult intubation and subglottic stenosis. Patient reports that she also had a fever 100.0 today. Yesterday she reports fever 101.0. Patient also complaining of cough which is productive of white phlegm. Patient denies any numbness/weakness in her extremity, abdominal pain, nausea/vomiting.  The history is provided by the patient and the spouse. The history is limited by the condition of the patient (Patient can only whisper secondary to trach use).    Past Medical History:  Diagnosis Date  . Arthritis   . Asthma   . Chronic back pain   . Chronic chest pain   . Depression   . DM (diabetes mellitus) ([place])    INSULIN DEPENDENT  . GERD  (gastroesophageal reflux disease)   . Headache(784.0)   . Hypertension   . Hypokalemia   . Respiratory disease 05/2017  . Tracheostomy in place [facility]) 02/2017    Patient Active Problem List   Diagnosis Date Noted  . Acute respiratory distress 06/02/2017  . Elevated lactic acid level 06/02/2017  . Sepsis ([place]) 05/07/2017  . Cough 05/06/2017  . Normocytic anemia 05/06/2017  . Hypokalemia 05/06/2017  . Tracheostomy status ([place]) 04/28/2017  . Tracheal stenosis 03/04/2017  . Acute blood loss anemia   . Generalized anxiety disorder   . Hypoalbuminemia due to protein-calorie malnutrition ([place])   . Severe asthma with acute exacerbation   . Critical illness myopathy 02/27/2017  . Type 2 diabetes mellitus with diabetic polyneuropathy, without long-term current use of insulin ([place])   . Morbid obesity ([place])   . Chronic pain syndrome   . Critical illness polyneuropathy ([place])   . Acute renal failure ([place])   . Gastritis and gastroduodenitis 02/22/2016  . Depression 02/22/2016  . Migraine 01/30/2015  . Back pain 09/05/2013  . Essential hypertension 09/05/2013  . Acute bronchitis 05/01/2013  . Asthma exacerbation 04/27/2013  . Allergic rhinitis, seasonal 08/11/2012  . Chronic cough 08/11/2012  . Sleep apnea, obstructive 12/04/2011    Past Surgical History:  Procedure Laterality Date  . APPENDECTOMY    . CESAREAN SECTION     x 3  . CHOLECYSTECTOMY N/A 03/02/2014   Procedure: LAPAROSCOPIC CHOLECYSTECTOMY;  Surgeon: Joyice Faster. Cornett, MD;  Location: [place];  Service:  General;  Laterality: N/A;  . HERNIA REPAIR    . PANENDOSCOPY N/A 03/04/2017   Procedure: PANENDOSCOPY WITH POSSIBLE FOREIGN BODY REMOVAL;  Surgeon: Jodi Marble, MD;  Location: [place];  Service: ENT;  Laterality: N/A;  . ROTATOR CUFF REPAIR    . TRACHEOSTOMY  02/2017  . VESICOVAGINAL FISTULA CLOSURE W/ TAH  2009    OB History    No data available       Home Medications    Prior to Admission medications     Medication Sig Start Date End Date Taking? Authorizing Provider  ACCU-CHEK SOFTCLIX LANCETS lancets Use as instructed to check blood sugar once daily. E11.9 06/18/17   Arnoldo Morale, MD  acetaminophen-codeine (TYLENOL #3) 300-30 MG tablet Take 1 tablet by mouth every 12 (twelve) hours as needed for moderate pain. 06/18/17   Arnoldo Morale, MD  albuterol (PROVENTIL HFA;VENTOLIN HFA) 108 (90 Base) MCG/ACT inhaler Inhale 2 puffs into the lungs every 6 (six) hours as needed for wheezing or shortness of breath. 04/28/17   Arnoldo Morale, MD  albuterol (PROVENTIL) (2.5 MG/3ML) 0.083% nebulizer solution Take 3 mLs (2.5 mg total) by nebulization every 6 (six) hours as needed for wheezing or shortness of breath. Reported on 02/22/2016 04/28/17   Arnoldo Morale, MD  amLODipine (NORVASC) 10 MG tablet Take 1 tablet (10 mg total) by mouth daily. 05/26/17   Arnoldo Morale, MD  Artificial Tear Ointment (ARTIFICIAL TEARS) ointment Place 1 application into both eyes at bedtime.     [provider]  atorvastatin (LIPITOR) 20 MG tablet Take 1 tablet (20 mg total) by mouth daily. 05/26/17   Arnoldo Morale, MD  benzonatate (TESSALON) 100 MG capsule Take 1 capsule (100 mg total) by mouth every 8 (eight) hours. 06/18/17   Arnoldo Morale, MD  Blood Glucose Monitoring Suppl (ACCU-CHEK AVIVA PLUS) w/Device KIT Used to check blood sugars once daily. E11.9 06/11/17   Arnoldo Morale, MD  carvedilol (COREG) 12.5 MG tablet Take 1 tablet (12.5 mg total) by mouth 2 (two) times daily with a meal. 06/26/17   Arnoldo Morale, MD  chlorhexidine gluconate, MEDLINE KIT, (PERIDEX) 0.12 % solution 15 mLs by Mouth Rinse route 2 (two) times daily. 03/05/17   Arnell Asal, NP  Dulaglutide 1.5 MG/0.5ML SOPN Inject 1.5 mg into the skin once a week. Patient not taking: Reported on 06/12/2017 05/27/17   Renato Shin, MD  Fluticasone-Salmeterol (ADVAIR DISKUS) 250-50 MCG/DOSE AEPB Inhale 1 puff into the lungs 2 (two) times daily. 05/26/17   Arnoldo Morale, MD   gabapentin (NEURONTIN) 300 MG capsule Take 1 capsule (300 mg total) by mouth 3 (three) times daily. 04/28/17   Arnoldo Morale, MD  glucose blood (ACCU-CHEK AVIVA) test strip Use as directed to check blood sugars once daily. E11.9 Patient not taking: Reported on 06/12/2017 06/11/17   Arnoldo Morale, MD  HYDROcodone-homatropine (HYCODAN) 5-1.5 MG/5ML syrup Take 5 mLs by mouth every 6 (six) hours as needed for cough. 07/02/17   Volanda Napoleon, PA-C  metFORMIN (GLUCOPHAGE-XR) 500 MG 24 hr tablet Take 1 tablet (500 mg total) by mouth daily with breakfast. 05/29/17   Renato Shin, MD  methocarbamol (ROBAXIN) 500 MG tablet Take 500 mg by mouth every 8 (eight) hours as needed for muscle spasms.    [provider]  NOVOLOG FLEXPEN 100 UNIT/ML FlexPen as directed. 05/20/17   [provider]  oxyCODONE-acetaminophen (PERCOCET/ROXICET) 5-325 MG tablet Take 1 tablet by mouth every 8 (eight) hours as needed for severe pain. 06/17/17  Payton Emerald, MD  pantoprazole (PROTONIX) 40 MG tablet Take 40 mg by mouth daily.  03/19/17   [provider]  predniSONE (DELTASONE) 10 MG tablet Take 2 tablets (20 mg total) by mouth daily. 07/02/17 07/08/17  Volanda Napoleon, PA-C  sertraline (ZOLOFT) 100 MG tablet Place 1 tablet (100 mg total) into feeding tube daily. 04/28/17   Arnoldo Morale, MD  sodium chloride HYPERTONIC 3 % nebulizer solution Take 4 mLs by nebulization 2 (two) times daily. Patient not taking: Reported on 06/12/2017 05/08/17   Aline August, MD  SUMAtriptan (IMITREX) 100 MG tablet Take 100 mg by mouth daily as needed. For migraine    [provider]  tiZANidine (ZANAFLEX) 4 MG tablet HOLD WHILE TAKING CIPROFLOXACIN 06/17/17   Mu, Pilar Plate, MD  topiramate (TOPAMAX) 100 MG tablet Take 1 tablet (100 mg total) by mouth 2 (two) times daily. 05/26/17   Arnoldo Morale, MD    Family History Family History  Problem Relation Age of Onset  . Heart attack Mother   . Stroke Mother   . Diabetes  Mother   . Hypertension Mother   . Arthritis Mother   . Stroke Father   . Hypertension Sister   . Diabetes Sister   . Seizures Brother   . Diabetes Brother     Social History Social History  Substance Use Topics  . Smoking status: Former Smoker    Packs/day: 0.25    Years: 22.00    Types: Cigarettes    Quit date: 01/20/2017  . Smokeless tobacco: Never Used  . Alcohol use No     Comment: socially     Allergies   Reglan [metoclopramide]   Review of Systems Review of Systems  Constitutional: Negative for chills and fever.  Respiratory: Positive for cough and shortness of breath.   Cardiovascular: Positive for chest pain.  Gastrointestinal: Negative for abdominal pain, diarrhea, nausea and vomiting.  Genitourinary: Negative for dysuria and hematuria.  Musculoskeletal: Negative for back pain.  Neurological: Positive for headaches. Negative for dizziness, weakness and numbness.     Physical Exam Updated Vital Signs BP (!) 153/107   Pulse (!) 105   Temp 99.1 F (37.3 C) (Oral)   Resp (!) 32   SpO2 100%   Physical Exam  Constitutional: She is oriented to person, place, and time. She appears well-developed and well-nourished.  Appears uncomfortable but no acute distress   HENT:  Head: Normocephalic and atraumatic.  Mouth/Throat: Oropharynx is clear and moist and mucous membranes are normal.  No tenderness to palpation of skull. No deformities or crepitus noted. No open wounds, abrasions or lacerations.   Eyes: Pupils are equal, round, and reactive to light. Conjunctivae, EOM and lids are normal.  Neck: Full passive range of motion without pain.  Trach secure in place  Cardiovascular: Regular rhythm, normal heart sounds and normal pulses.  Tachycardia present.  Exam reveals no gallop and no friction rub.   No murmur heard. Pulses:      Radial pulses are 2+ on the right side, and 2+ on the left side.  Pulmonary/Chest: Effort normal. She has rhonchi.  Labored  breathing. Intermittent coughing. Patient is able to whisper answers, which husband says is baseline. Diffuse rhonchi throughout all lung fields. Good air movement throughout lungs. No evidence of wheezing. No tenderness palpation to the anterior chest wall.  Abdominal: Soft. Normal appearance. There is no tenderness. There is no rigidity and no guarding.  Musculoskeletal: Normal range of motion.  Neurological: She is  alert and oriented to person, place, and time. GCS eye subscore is 4. GCS verbal subscore is 5. GCS motor subscore is 6.  Follows commands, Moves all extremities  5/5 strength to BUE and BLE  Sensation intact throughout all major nerve distributions CN III-XII intact  Difficulty assessing remaining neuro exam secondary to patient's continued coughing.   Skin: Skin is warm and dry. Capillary refill takes less than 2 seconds.  Psychiatric: She has a normal mood and affect. Her speech is normal.  Nursing note and vitals reviewed.    ED Treatments / Results  Labs (all labs ordered are listed, but only abnormal results are displayed) Labs Reviewed  CBC WITH DIFFERENTIAL/PLATELET - Abnormal; Notable for the following:       Result Value   WBC 10.6 (*)    Hemoglobin 10.9 (*)    HCT 33.7 (*)    Lymphs Abs 5.0 (*)    All other components within normal limits  COMPREHENSIVE METABOLIC PANEL - Abnormal; Notable for the following:    CO2 20 (*)    Glucose, Bld 322 (*)    Creatinine, Ser 1.20 (*)    Albumin 3.4 (*)    GFR calc non Af Amer 53 (*)    All other components within normal limits  I-STAT TROPONIN, ED    EKG  EKG Interpretation  Date/Time:  Thursday July 02 2017 20:28:02 EDT Ventricular Rate:  109 PR Interval:    QRS Duration: 68 QT Interval:  344 QTC Calculation: 464 R Axis:   27 Text Interpretation:  Age not entered, assumed to be  45 years old for purpose of ECG interpretation Sinus tachycardia Low voltage, precordial leads Borderline T abnormalities,  anterior leads Baseline wander in lead(s) V3 No significant change since last tracing Confirmed by Merrily Pew [phone]) on 07/02/2017 8:36:04 PM Also confirmed by Merrily Pew [phone]), editor Laurena Spies [phone])  on 07/03/2017 7:05:14 AM       Radiology Dg Chest 2 View  Result Date: 07/02/2017 CLINICAL DATA:  46 year old female with productive cough headache and hypertension. EXAM: CHEST  2 VIEW COMPARISON:  06/16/2017 and earlier. FINDINGS: Chronic tracheostomy appears stable. Stable lung volumes. Normal cardiac size and mediastinal contours. No pneumothorax, pulmonary edema, pleural effusion or confluent pulmonary opacity. Negative visible bowel gas pattern. No acute osseous abnormality identified. IMPRESSION: No acute cardiopulmonary abnormality. Electronically Signed   By: Genevie Ann M.D.   On: 07/02/2017 18:28    Procedures Procedures (including critical care time)  Medications Ordered in ED Medications  albuterol (PROVENTIL) (2.5 MG/3ML) 0.083% nebulizer solution 2.5 mg (2.5 mg Nebulization Given 07/02/17 1953)  guaiFENesin (ROBITUSSIN) 100 MG/5ML solution 100 mg (100 mg Oral Given 07/02/17 2006)  SUMAtriptan (IMITREX) tablet 100 mg (100 mg Oral Given 07/02/17 2006)  sodium chloride 0.9 % bolus 500 mL (0 mLs Intravenous Stopped 07/02/17 2347)  prochlorperazine (COMPAZINE) injection 10 mg (10 mg Intravenous Given 07/02/17 2056)  diphenhydrAMINE (BENADRYL) injection 25 mg (25 mg Intravenous Given 07/02/17 2054)  insulin glargine (LANTUS) injection 7 Units (7 Units Subcutaneous Given 07/02/17 2318)     Initial Impression / Assessment and Plan / ED Course  I have reviewed the triage vital signs and the nursing notes.  Pertinent labs & imaging results that were available during my care of the patient were reviewed by me and considered in my medical decision making (see chart for details).     46 y.o. F with PMH/o trach who presents initially with HTN and HA. HA is  similar to  migraines. Also with CP and SOB that began while in triage. Patient suspect secondary to coughing. Patient is afebrile. Vitals reviewed. Initial vitals show BP 140/100, tachycardic. Patient states she ahs taken her BP meds today.  On my initial evaluation, patient was having labored breathing. Evaluation of lungs showed good air movement and absence of wheezes but she had diffuse rhonchi and intermittent coughing, which I believe is contributing to her symptoms. Throughout entire interview O2 sats are 100% on RA. RT was contacted and patient was started on aerosol neb to help break secretions. Attempts are suctioning via trach were made during initial triage evaluation. Per RN, there were some thick white secretions that were removed. Consider asthma exacerbation vs acute infectious etiology. History/physical exam are not concerning for PE. Will plan to check basic labs. Will plan to give Sumitripan dose to patient in the ED for migraine relief. Will given additional cough medications. Will also plan for nebulizer treatment in the ED. Plan to obtain XR imaging for further evaluation.   Patient reports improvement in cough after medications. Re-evaluation of lungs show improvement in labored breathing and rhonchi. She states that she has not had any more CP since coughing has subsided. Doubt ACS etiology. Suspect CP might be secondary to irritation from cough. Patient reports she has had not improvement in headache after Sumitriptan. Difficult to assess neuro exam secondary to patient feeling uncomfortable, though she does have equal strength to BUE and BLE. Review of records show that patient is allergic to Reglan. She has had previous visits that have been treated with IVF, Compazine, and Benadryl. Discussed with patient and she reports that this has rowrked in the past. Recommended adding benadryl but patient does not wish to have decadron at this time. Given incomplete neuro exam, will plan for CT head for  evaluation.   RN informed me that patient refused CT head. I discussed with patient. We discussed the risks vs benefits of declining a CT head at this time. Patinet expresess understanding of risks and still wishes to decline at this time.   Trop negative. CMP shows hyperglycemia. Do not suspect DKA at this time. She has slight bump in Cr. Records show she has had this previously 3 weeks ago. Suspect it will correct itself with fluids. CBC shows WBC at 10.6 which is trending downward. Also with anemia but records show this is consistnet with previous. CXR shows no acute abnormality.   Discussed results with patient. Patient is havign slight improvement in headache after fluids and meds. Fluids still running. Will observe and re-evaluation. Still with no CP. She is still tachycardic. BP is 140s/90s.   Re-evaluation. Patient reports that headache has improved significantly. Patient is able to better complete a neuro exam. She has limited ROM of RUE secondary to IV placement but otherwise normal. Will plan to give dose of insulin in the department for hyperglycemia. Instructed her not to take her night time dose at home. Patient has not taken her nighttime BP medications. Offererd to give meds here in the department but patient declines at this time. Instructed her to follow-up with her PCP in the next 24-48 hours. Her HTN medications were recently adjusted over the phone from 6.25 to 12.5 but unclear if she has been compliant with that change. Also concern that patient is not being compliant with her motrin. Encouraged compliance with medication.s Will plan to provide symptomatic relief for at home. Strict return precautions discussed. Patient expresses understanding and agreement to  plan.      Final Clinical Impressions(s) / ED Diagnoses   Final diagnoses:  Hypertension, unspecified type  Acute nonintractable headache, unspecified headache type  Cough    New Prescriptions Discharge Medication  List as of 07/02/2017 11:52 PM    START taking these medications   Details  HYDROcodone-homatropine (HYCODAN) 5-1.5 MG/5ML syrup Take 5 mLs by mouth every 6 (six) hours as needed for cough., Starting Thu 07/02/2017, Print    predniSONE (DELTASONE) 10 MG tablet Take 2 tablets (20 mg total) by mouth daily., Starting Thu 07/02/2017, Until Wed 07/08/2017, Print         Volanda Napoleon, PA-C 07/04/17 2356    Mesner, Corene Cornea, MD 07/06/17 1700

## 2017-07-02 NOTE — ED Provider Notes (Signed)
Medical screening examination/treatment/procedure(s) were conducted as a shared visit with non-physician practitioner(s) and myself.  I personally evaluated the patient during the encounter.  He had multiple issues that may or may not be related. Patient states that she is having high blood pressure, headache and worsening cough specifically in the last 24 hours. She tried increasing her blood pressure medication and try taking sumatriptan without relief. Headaches are similar to previous ones and the cough is similar to previous episodes of tracheitis. On exam she is coughing which makes her somewhat tachypneic. Other than that she's not having respiratory distress or oxygen was 99% old, was in there. Heart rate slightly elevated at 105 coughing. Blood pressure 503-88 systolic. Her lungs have bilateral upper lobe rhonchi. Her trach is in place without any obvious abnormalities. Plan will be to treat symptomatically at this time. Give her some breathing treatments cough medicine and try to alleviate her headache. May need antibiotics.   EKG Interpretation  Date/Time:  Thursday July 02 2017 20:28:02 EDT Ventricular Rate:  109 PR Interval:    QRS Duration: 68 QT Interval:  344 QTC Calculation: 464 R Axis:   27 Text Interpretation:  Age not entered, assumed to be  46 years old for purpose of ECG interpretation Sinus tachycardia Low voltage, precordial leads Borderline T abnormalities, anterior leads Baseline wander in lead(s) V3 No significant change since last tracing Confirmed by Merrily Pew [phone]) on 07/02/2017 8:36:04 PM Also confirmed by Merrily Pew [phone]), editor Laurena Spies [phone])  on 07/03/2017 7:05:14 AM         Merrily Pew, MD 07/06/17 1659

## 2017-07-02 NOTE — ED Notes (Signed)
Pt refused to go to CT EDP made aware

## 2017-07-02 NOTE — ED Triage Notes (Signed)
Pt to ED with c/o hypertension and headache x's 3 days.  Also c/o productive cough.  Pt s'ts she takes HTN meds and her MD told her to double to dose in which she did but blood pressure remains high

## 2017-07-02 NOTE — Telephone Encounter (Signed)
Pt nurse Claiborne Billings call to inform the nurse and the pcp that pt BP is 180/110 her sugar level is between 250-300 today as 265, she is coughing yellow phlegm and her hart rate is 90, she (nurse) want to know what we can do please follow up

## 2017-07-02 NOTE — Telephone Encounter (Signed)
Noted  

## 2017-07-02 NOTE — ED Notes (Signed)
Pt removed all of the cardiac leads and states she doesn't want them on anymore. RN notified.

## 2017-07-03 NOTE — Telephone Encounter (Signed)
Will route to PCP 

## 2017-07-03 NOTE — Telephone Encounter (Signed)
She missed her appointment with me yesterday. Carvedilol had been increased to 12.5 mg after a similar call a few days ago; please verify if she is taking this dose. Increase metformin to 2 tablets twice daily. What her vitals today?

## 2017-07-03 NOTE — Telephone Encounter (Signed)
Pt went to the ED yesterday. °

## 2017-07-06 NOTE — Telephone Encounter (Addendum)
Call placed to the patient to check on her. Her daughter, [name] was with her and spoke for her.  The patient confirmed that she has been taking carvedilol 12.5 mg two times  a day which is also noted on her AVS from the ED on 07/02/17.  Her BP today was 144/99. Her daughter said that she still has to pick up the hycodan and prednisone from the pharmacy. Instructed her to increase her metformin to 2 tablets two times a day which was confirmed with Dr Jarold Song prior to this call. She has not been taking this.  Her blood sugar this morning was 368 and at 1630 was 294.   No other concerns reported at this time.  She has an appointment with Dr Jarold Song on 07/14/17

## 2017-07-07 ENCOUNTER — Other Ambulatory Visit: Payer: Self-pay | Admitting: Family Medicine

## 2017-07-07 DIAGNOSIS — J4530 Mild persistent asthma, uncomplicated: Secondary | ICD-10-CM

## 2017-07-07 MED ORDER — METHOCARBAMOL 500 MG PO TABS: 500.0000 mg | ORAL_TABLET | Freq: Three times a day (TID) | ORAL | 2 refills | Status: DC | PRN

## 2017-07-07 MED ORDER — ALBUTEROL SULFATE HFA 108 (90 BASE) MCG/ACT IN AERS: 2.0000 | INHALATION_SPRAY | Freq: Four times a day (QID) | RESPIRATORY_TRACT | 2 refills | Status: DC | PRN

## 2017-07-08 DIAGNOSIS — J386 Stenosis of larynx: Secondary | ICD-10-CM | POA: Diagnosis not present

## 2017-07-08 DIAGNOSIS — I130 Hypertensive heart and chronic kidney disease with heart failure and stage 1 through stage 4 chronic kidney disease, or unspecified chronic kidney disease: Secondary | ICD-10-CM | POA: Diagnosis not present

## 2017-07-08 DIAGNOSIS — I5032 Chronic diastolic (congestive) heart failure: Secondary | ICD-10-CM | POA: Diagnosis not present

## 2017-07-08 DIAGNOSIS — J45901 Unspecified asthma with (acute) exacerbation: Secondary | ICD-10-CM | POA: Diagnosis not present

## 2017-07-08 DIAGNOSIS — J9611 Chronic respiratory failure with hypoxia: Secondary | ICD-10-CM | POA: Diagnosis not present

## 2017-07-08 DIAGNOSIS — J40 Bronchitis, not specified as acute or chronic: Secondary | ICD-10-CM | POA: Diagnosis not present

## 2017-07-10 ENCOUNTER — Telehealth: Payer: Self-pay

## 2017-07-10 DIAGNOSIS — I10 Essential (primary) hypertension: Secondary | ICD-10-CM | POA: Diagnosis not present

## 2017-07-10 DIAGNOSIS — G4733 Obstructive sleep apnea (adult) (pediatric): Secondary | ICD-10-CM | POA: Diagnosis not present

## 2017-07-10 DIAGNOSIS — Z01818 Encounter for other preprocedural examination: Secondary | ICD-10-CM | POA: Diagnosis not present

## 2017-07-10 DIAGNOSIS — E119 Type 2 diabetes mellitus without complications: Secondary | ICD-10-CM | POA: Diagnosis not present

## 2017-07-10 DIAGNOSIS — J398 Other specified diseases of upper respiratory tract: Secondary | ICD-10-CM | POA: Diagnosis not present

## 2017-07-10 DIAGNOSIS — F329 Major depressive disorder, single episode, unspecified: Secondary | ICD-10-CM | POA: Diagnosis not present

## 2017-07-10 DIAGNOSIS — J449 Chronic obstructive pulmonary disease, unspecified: Secondary | ICD-10-CM | POA: Diagnosis not present

## 2017-07-10 NOTE — Telephone Encounter (Signed)
Noted  

## 2017-07-10 NOTE — Telephone Encounter (Signed)
Call placed to check on the patient.  Her husband was with her and spoke for her.   He stated that she is feeling " fine."  No complaints. He said that she had obtained the hycodan and prednisone. Her BP today was 141/91 and she continues to take carvedilol 12.5 mg twice daily.  Her blood sugar was 400+ once  yesterday which she thinks was attributed to the cough medicine or something she ate. Today her blood sugars have been in the 200's. She is taking metformin 2 tablets two times a day.   She had no questions or concerns at this time.   appt with Dr Jarold Song - 07/14/17

## 2017-07-14 ENCOUNTER — Ambulatory Visit: Payer: Medicare Other | Attending: Family Medicine | Admitting: Family Medicine

## 2017-07-14 ENCOUNTER — Encounter: Payer: Self-pay | Admitting: Family Medicine

## 2017-07-14 VITALS — BP 134/93 | HR 100 | Temp 98.9°F | Ht 61.0 in | Wt 232.2 lb

## 2017-07-14 DIAGNOSIS — M545 Low back pain, unspecified: Secondary | ICD-10-CM

## 2017-07-14 DIAGNOSIS — J398 Other specified diseases of upper respiratory tract: Secondary | ICD-10-CM | POA: Diagnosis not present

## 2017-07-14 DIAGNOSIS — E876 Hypokalemia: Secondary | ICD-10-CM | POA: Insufficient documentation

## 2017-07-14 DIAGNOSIS — Z79899 Other long term (current) drug therapy: Secondary | ICD-10-CM | POA: Insufficient documentation

## 2017-07-14 DIAGNOSIS — E1142 Type 2 diabetes mellitus with diabetic polyneuropathy: Secondary | ICD-10-CM

## 2017-07-14 DIAGNOSIS — Z930 Tracheostomy status: Secondary | ICD-10-CM | POA: Diagnosis not present

## 2017-07-14 DIAGNOSIS — I10 Essential (primary) hypertension: Secondary | ICD-10-CM

## 2017-07-14 DIAGNOSIS — K219 Gastro-esophageal reflux disease without esophagitis: Secondary | ICD-10-CM | POA: Diagnosis not present

## 2017-07-14 DIAGNOSIS — J4530 Mild persistent asthma, uncomplicated: Secondary | ICD-10-CM | POA: Diagnosis not present

## 2017-07-14 DIAGNOSIS — F329 Major depressive disorder, single episode, unspecified: Secondary | ICD-10-CM | POA: Insufficient documentation

## 2017-07-14 DIAGNOSIS — S199XXS Unspecified injury of neck, sequela: Secondary | ICD-10-CM | POA: Diagnosis not present

## 2017-07-14 DIAGNOSIS — G4733 Obstructive sleep apnea (adult) (pediatric): Secondary | ICD-10-CM | POA: Insufficient documentation

## 2017-07-14 DIAGNOSIS — J386 Stenosis of larynx: Secondary | ICD-10-CM | POA: Insufficient documentation

## 2017-07-14 DIAGNOSIS — E7800 Pure hypercholesterolemia, unspecified: Secondary | ICD-10-CM

## 2017-07-14 DIAGNOSIS — G8929 Other chronic pain: Secondary | ICD-10-CM

## 2017-07-14 DIAGNOSIS — Z794 Long term (current) use of insulin: Secondary | ICD-10-CM | POA: Diagnosis not present

## 2017-07-14 DIAGNOSIS — G43119 Migraine with aura, intractable, without status migrainosus: Secondary | ICD-10-CM

## 2017-07-14 LAB — GLUCOSE, POCT (MANUAL RESULT ENTRY): POC GLUCOSE: 234 mg/dL — AB (ref 70–99)

## 2017-07-14 MED ORDER — HYDROCODONE-HOMATROPINE 5-1.5 MG/5ML PO SYRP: 5.0000 mL | ORAL_SOLUTION | Freq: Four times a day (QID) | ORAL | 0 refills | Status: DC | PRN

## 2017-07-14 MED ORDER — ALBUTEROL SULFATE (2.5 MG/3ML) 0.083% IN NEBU: 2.5000 mg | INHALATION_SOLUTION | Freq: Four times a day (QID) | RESPIRATORY_TRACT | 3 refills | Status: DC | PRN

## 2017-07-14 MED ORDER — SUMATRIPTAN SUCCINATE 100 MG PO TABS: ORAL_TABLET | ORAL | 1 refills | Status: DC

## 2017-07-14 MED ORDER — TIZANIDINE HCL 4 MG PO TABS: 4.0000 mg | ORAL_TABLET | Freq: Three times a day (TID) | ORAL | 1 refills | Status: DC | PRN

## 2017-07-14 MED ORDER — AMLODIPINE BESYLATE 10 MG PO TABS: 10.0000 mg | ORAL_TABLET | Freq: Every day | ORAL | 0 refills | Status: DC

## 2017-07-14 MED ORDER — METFORMIN HCL ER 500 MG PO TB24: 1000.0000 mg | ORAL_TABLET | Freq: Two times a day (BID) | ORAL | 3 refills | Status: DC

## 2017-07-14 MED ORDER — BENZONATATE 100 MG PO CAPS: 100.0000 mg | ORAL_CAPSULE | Freq: Three times a day (TID) | ORAL | 0 refills | Status: DC

## 2017-07-14 MED ORDER — ATORVASTATIN CALCIUM 20 MG PO TABS: 20.0000 mg | ORAL_TABLET | Freq: Every day | ORAL | 0 refills | Status: DC

## 2017-07-14 MED ORDER — PANTOPRAZOLE SODIUM 40 MG PO TBEC: 40.0000 mg | DELAYED_RELEASE_TABLET | Freq: Every day | ORAL | 1 refills | Status: DC

## 2017-07-14 NOTE — Patient Instructions (Signed)
Diabetes Mellitus and Food It is important for you to manage your blood sugar (glucose) level. Your blood glucose level can be greatly affected by what you eat. Eating healthier foods in the appropriate amounts throughout the day at about the same time each day will help you control your blood glucose level. It can also help slow or prevent worsening of your diabetes mellitus. Healthy eating may even help you improve the level of your blood pressure and reach or maintain a healthy weight. General recommendations for healthful eating and cooking habits include:  Eating meals and snacks regularly. Avoid going long periods of time without eating to lose weight.  Eating a diet that consists mainly of plant-based foods, such as fruits, vegetables, nuts, legumes, and whole grains.  Using low-heat cooking methods, such as baking, instead of high-heat cooking methods, such as deep frying.  Work with your dietitian to make sure you understand how to use the Nutrition Facts information on food labels. How can food affect me? Carbohydrates Carbohydrates affect your blood glucose level more than any other type of food. Your dietitian will help you determine how many carbohydrates to eat at each meal and teach you how to count carbohydrates. Counting carbohydrates is important to keep your blood glucose at a healthy level, especially if you are using insulin or taking certain medicines for diabetes mellitus. Alcohol Alcohol can cause sudden decreases in blood glucose (hypoglycemia), especially if you use insulin or take certain medicines for diabetes mellitus. Hypoglycemia can be a life-threatening condition. Symptoms of hypoglycemia (sleepiness, dizziness, and disorientation) are similar to symptoms of having too much alcohol. If your health care provider has given you approval to drink alcohol, do so in moderation and use the following guidelines:  Women should not have more than one drink per day, and men  should not have more than two drinks per day. One drink is equal to: ? 12 oz of beer. ? 5 oz of wine. ? 1 oz of hard liquor.  Do not drink on an empty stomach.  Keep yourself hydrated. Have water, diet soda, or unsweetened iced tea.  Regular soda, juice, and other mixers might contain a lot of carbohydrates and should be counted.  What foods are not recommended? As you make food choices, it is important to remember that all foods are not the same. Some foods have fewer nutrients per serving than other foods, even though they might have the same number of calories or carbohydrates. It is difficult to get your body what it needs when you eat foods with fewer nutrients. Examples of foods that you should avoid that are high in calories and carbohydrates but low in nutrients include:  Trans fats (most processed foods list trans fats on the Nutrition Facts label).  Regular soda.  Juice.  Candy.  Sweets, such as cake, pie, doughnuts, and cookies.  Fried foods.  What foods can I eat? Eat nutrient-rich foods, which will nourish your body and keep you healthy. The food you should eat also will depend on several factors, including:  The calories you need.  The medicines you take.  Your weight.  Your blood glucose level.  Your blood pressure level.  Your cholesterol level.  You should eat a variety of foods, including:  Protein. ? Lean cuts of meat. ? Proteins low in saturated fats, such as fish, egg whites, and beans. Avoid processed meats.  Fruits and vegetables. ? Fruits and vegetables that may help control blood glucose levels, such as apples,   mangoes, and yams.  Dairy products. ? Choose fat-free or low-fat dairy products, such as milk, yogurt, and cheese.  Grains, bread, pasta, and rice. ? Choose whole grain products, such as multigrain bread, whole oats, and brown rice. These foods may help control blood pressure.  Fats. ? Foods containing healthful fats, such as  nuts, avocado, olive oil, canola oil, and fish.  Does everyone with diabetes mellitus have the same meal plan? Because every person with diabetes mellitus is different, there is not one meal plan that works for everyone. It is very important that you meet with a dietitian who will help you create a meal plan that is just right for you. This information is not intended to replace advice given to you by your health care provider. Make sure you discuss any questions you have with your health care provider. Document Released: 06/05/2005 Document Revised: 02/14/2016 Document Reviewed: 08/05/2013 Elsevier Interactive Patient Education  2017 [employer].  

## 2017-07-14 NOTE — Progress Notes (Signed)
Subjective:  Patient ID: [name], female    DOB: October 13, 1970  Age: 46 y.o. MRN: [id number]  CC: Hypertension and Diabetes   [name] is a 46 year old female with a history of type 2 diabetes mellitus (A1c 8.3 up from 7.4), hypertension, obstructive sleep apnea, asthma, depression, s/p tracheostomy secondary to intubation related tracheal injury, subglottic stenosis (status post balloon dilatation and Kenalog injection of stenosis tracheostomy tube exchange by ENT at [facility]) who presents today for follow-up visit accompanied by her husband.  She was seen by cardiothoracic surgery at [facility] on 07/10/17 for evaluation for surgical resection of tracheal stenosis and reconstruction of the trachea and this has been scheduled for 07/26/17. She does continue to have persisting cough which is associated with pain at the site of the tracheostomy tube for which she has been using Hycodan syrup and is requesting a refill. She did well on Tessalon Perles in the past. Denies shortness of breath or chest pain.  She has had ED visits for elevated blood pressure but today her blood pressure is close to normal and she has been compliant with her medications. Her blood sugars however have been elevated in the 200s and her diabetes is managed by endocrinology - Dr Loanne Drilling.  She does have chronic back pain for which she was prescribed Robaxin but hd to pay out of pocket as this was not covered by insurance. Back pain does not radiate down her lower extremities.   Past Medical History:  Diagnosis Date  . Arthritis   . Asthma   . Chronic back pain   . Chronic chest pain   . Depression   . DM (diabetes mellitus) ([place])    INSULIN DEPENDENT  . GERD (gastroesophageal reflux disease)   . Headache(784.0)   . Hypertension   . Hypokalemia   . Respiratory disease 05/2017  . Tracheostomy in place [facility]) 02/2017    Past Surgical History:  Procedure  Laterality Date  . APPENDECTOMY    . CESAREAN SECTION     x 3  . CHOLECYSTECTOMY N/A 03/02/2014   Procedure: LAPAROSCOPIC CHOLECYSTECTOMY;  Surgeon: Joyice Faster. Cornett, MD;  Location: [place];  Service: General;  Laterality: N/A;  . HERNIA REPAIR    . PANENDOSCOPY N/A 03/04/2017   Procedure: PANENDOSCOPY WITH POSSIBLE FOREIGN BODY REMOVAL;  Surgeon: Jodi Marble, MD;  Location: [place];  Service: ENT;  Laterality: N/A;  . ROTATOR CUFF REPAIR    . TRACHEOSTOMY  02/2017  . VESICOVAGINAL FISTULA CLOSURE W/ TAH  2009    Allergies  Allergen Reactions  . Reglan [Metoclopramide] Other (See Comments)    Panic attack     Outpatient Medications Prior to Visit  Medication Sig Dispense Refill  . ACCU-CHEK SOFTCLIX LANCETS lancets Use as instructed to check blood sugar once daily. E11.9 100 each 12  . albuterol (PROVENTIL HFA;VENTOLIN HFA) 108 (90 Base) MCG/ACT inhaler Inhale 2 puffs into the lungs every 6 (six) hours as needed for wheezing or shortness of breath. 1 Inhaler 2  . Artificial Tear Ointment (ARTIFICIAL TEARS) ointment Place 1 application into both eyes at bedtime.     . Blood Glucose Monitoring Suppl (ACCU-CHEK AVIVA PLUS) w/Device KIT Used to check blood sugars once daily. E11.9 1 kit 0  . carvedilol (COREG) 12.5 MG tablet Take 1 tablet (12.5 mg total) by mouth 2 (two) times daily with a meal. 60 tablet 3  . chlorhexidine gluconate, MEDLINE KIT, (PERIDEX) 0.12 % solution 15 mLs by  Mouth Rinse route 2 (two) times daily. 120 mL 0  . Fluticasone-Salmeterol (ADVAIR DISKUS) 250-50 MCG/DOSE AEPB Inhale 1 puff into the lungs 2 (two) times daily. 180 each 0  . gabapentin (NEURONTIN) 300 MG capsule Take 1 capsule (300 mg total) by mouth 3 (three) times daily. 90 capsule 5  . NOVOLOG FLEXPEN 100 UNIT/ML FlexPen as directed.  0  . sertraline (ZOLOFT) 100 MG tablet Place 1 tablet (100 mg total) into feeding tube daily. 30 tablet 5  . topiramate (TOPAMAX) 100 MG tablet Take 1 tablet (100 mg total) by  mouth 2 (two) times daily. 180 tablet 0  . acetaminophen-codeine (TYLENOL #3) 300-30 MG tablet Take 1 tablet by mouth every 12 (twelve) hours as needed for moderate pain. 30 tablet 0  . albuterol (PROVENTIL) (2.5 MG/3ML) 0.083% nebulizer solution Take 3 mLs (2.5 mg total) by nebulization every 6 (six) hours as needed for wheezing or shortness of breath. Reported on 02/22/2016 75 mL 3  . amLODipine (NORVASC) 10 MG tablet Take 1 tablet (10 mg total) by mouth daily. 90 tablet 0  . atorvastatin (LIPITOR) 20 MG tablet Take 1 tablet (20 mg total) by mouth daily. 90 tablet 0  . benzonatate (TESSALON) 100 MG capsule Take 1 capsule (100 mg total) by mouth every 8 (eight) hours. 21 capsule 0  . HYDROcodone-homatropine (HYCODAN) 5-1.5 MG/5ML syrup Take 5 mLs by mouth every 6 (six) hours as needed for cough. 120 mL 0  . metFORMIN (GLUCOPHAGE-XR) 500 MG 24 hr tablet Take 1 tablet (500 mg total) by mouth daily with breakfast. 90 tablet 3  . methocarbamol (ROBAXIN) 500 MG tablet Take 1 tablet (500 mg total) by mouth every 8 (eight) hours as needed for muscle spasms. 90 tablet 2  . oxyCODONE-acetaminophen (PERCOCET/ROXICET) 5-325 MG tablet Take 1 tablet by mouth every 8 (eight) hours as needed for severe pain. 10 tablet 0  . pantoprazole (PROTONIX) 40 MG tablet Take 40 mg by mouth daily.     . SUMAtriptan (IMITREX) 100 MG tablet Take 100 mg by mouth daily as needed. For migraine    . tiZANidine (ZANAFLEX) 4 MG tablet HOLD WHILE TAKING CIPROFLOXACIN    . Dulaglutide 1.5 MG/0.5ML SOPN Inject 1.5 mg into the skin once a week. (Patient not taking: Reported on 06/12/2017) 4 pen 11  . glucose blood (ACCU-CHEK AVIVA) test strip Use as directed to check blood sugars once daily. E11.9 (Patient not taking: Reported on 06/12/2017) 100 each 12  . sodium chloride HYPERTONIC 3 % nebulizer solution Take 4 mLs by nebulization 2 (two) times daily. (Patient not taking: Reported on 06/12/2017) 750 mL 0   No facility-administered  medications prior to visit.     ROS Review of Systems  Constitutional: Negative for activity change, appetite change and fatigue.  HENT: Negative for congestion, sinus pressure and sore throat.   Eyes: Negative for visual disturbance.  Respiratory: Positive for cough. Negative for chest tightness, shortness of breath and wheezing.   Cardiovascular: Negative for chest pain and palpitations.  Gastrointestinal: Negative for abdominal distention, abdominal pain and constipation.  Endocrine: Negative for polydipsia.  Genitourinary: Negative for dysuria and frequency.  Musculoskeletal: Positive for back pain. Negative for arthralgias.  Skin: Negative for rash.  Neurological: Negative for tremors, light-headedness and numbness.  Hematological: Does not bruise/bleed easily.  Psychiatric/Behavioral: Negative for agitation and behavioral problems.    Objective:  BP (!) 134/93   Pulse 100   Temp 98.9 F (37.2 C) (Oral)   Ht 5' 1"  (  1.549 m)   Wt 232 lb 3.2 oz (105.3 kg)   SpO2 93%   BMI 43.87 kg/m   BP/Weight 07/14/2017 07/02/2017 12/05/4006  Systolic BP 676 195 093  Diastolic BP 93 267 95  Wt. (Lbs) 232.2 - 231.8  BMI 43.87 - 43.8      Physical Exam  Constitutional: She is oriented to person, place, and time. She appears well-developed and well-nourished.  Neck:  Tracheostomy tube in place  Cardiovascular: Normal rate, normal heart sounds and intact distal pulses.   No murmur heard. Pulmonary/Chest: Effort normal and breath sounds normal. She has no wheezes. She has no rales. She exhibits no tenderness.  Abdominal: Soft. Bowel sounds are normal. She exhibits no distension and no mass. There is no tenderness.  Musculoskeletal: Normal range of motion.  Neurological: She is alert and oriented to person, place, and time.  Skin: Skin is warm and dry.  Psychiatric: She has a normal mood and affect.    Lab Results  Component Value Date   HGBA1C 8.3 (H) 06/02/2017    Lipid  Panel     Component Value Date/Time   CHOL 175 01/05/2017 1025   TRIG 190 (H) 03/04/2017 1414   HDL 48 01/05/2017 1025   CHOLHDL 3.6 01/05/2017 1025   CHOLHDL 3.9 02/22/2016 0954   VLDL 26 02/22/2016 0954   LDLCALC 101 (H) 01/05/2017 1025    Assessment & Plan:   1. Type 2 diabetes mellitus with diabetic polyneuropathy, without long-term current use of insulin (HCC) Uncontrolled with A1c of 8.3 Likely due to multiple steroid use of recent Increased dose of metformin Continue NovoLog,Trulicity and keep appointment with endocrine - POCT glucose (manual entry) - metFORMIN (GLUCOPHAGE-XR) 500 MG 24 hr tablet; Take 2 tablets (1,000 mg total) by mouth 2 (two) times daily.  Dispense: 120 tablet; Refill: 3  2. Pure hypercholesterolemia Controlled Low cholesterol diet - atorvastatin (LIPITOR) 20 MG tablet; Take 1 tablet (20 mg total) by mouth daily.  Dispense: 90 tablet; Refill: 0  3. Stable asthma, mild persistent No recent exacerbation - albuterol (PROVENTIL) (2.5 MG/3ML) 0.083% nebulizer solution; Take 3 mLs (2.5 mg total) by nebulization every 6 (six) hours as needed for wheezing or shortness of breath. Reported on 02/22/2016  Dispense: 75 mL; Refill: 3  4. Intractable migraine with aura without status migrainosus Controlled Continue Topamax for prophylaxis - SUMAtriptan (IMITREX) 100 MG tablet; At the onset of a migraine; may repeat in 2 hours. Max daily dose 277m  Dispense: 20 tablet; Refill: 1  5. Benign essential HTN Slightly above goal of less than 130/80 Low-sodium, DASH diet No regimen change - amLODipine (NORVASC) 10 MG tablet; Take 1 tablet (10 mg total) by mouth daily.  Dispense: 90 tablet; Refill: 0  6. Tracheal stenosis Currently being worked up for resection of tracheal stenosis and reconstruction of trachea  - HYDROcodone-homatropine (HYCODAN) 5-1.5 MG/5ML syrup; Take 5 mLs by mouth every 6 (six) hours as needed for cough.  Dispense: 120 mL; Refill: 0 - benzonatate  (TESSALON) 100 MG capsule; Take 1 capsule (100 mg total) by mouth every 8 (eight) hours.  Dispense: 60 capsule; Refill: 0  7. Chronic bilateral low back pain without sciatica Advised to apply heat Had to pay for methocarbamol out-of-pocket; switching to tizanidine which is hopefully covered by insurance - tiZANidine (ZANAFLEX) 4 MG tablet; Take 1 tablet (4 mg total) by mouth every 8 (eight) hours as needed for muscle spasms.  Dispense: 180 tablet; Refill: 1   Meds ordered this  encounter  Medications  . HYDROcodone-homatropine (HYCODAN) 5-1.5 MG/5ML syrup    Sig: Take 5 mLs by mouth every 6 (six) hours as needed for cough.    Dispense:  120 mL    Refill:  0  . metFORMIN (GLUCOPHAGE-XR) 500 MG 24 hr tablet    Sig: Take 2 tablets (1,000 mg total) by mouth 2 (two) times daily.    Dispense:  120 tablet    Refill:  3  . pantoprazole (PROTONIX) 40 MG tablet    Sig: Take 1 tablet (40 mg total) by mouth daily.    Dispense:  90 tablet    Refill:  1  . tiZANidine (ZANAFLEX) 4 MG tablet    Sig: Take 1 tablet (4 mg total) by mouth every 8 (eight) hours as needed for muscle spasms.    Dispense:  180 tablet    Refill:  1    Discontinue Robaxin  . atorvastatin (LIPITOR) 20 MG tablet    Sig: Take 1 tablet (20 mg total) by mouth daily.    Dispense:  90 tablet    Refill:  0    Please d/c previous atorvastatin scripts  . albuterol (PROVENTIL) (2.5 MG/3ML) 0.083% nebulizer solution    Sig: Take 3 mLs (2.5 mg total) by nebulization every 6 (six) hours as needed for wheezing or shortness of breath. Reported on 02/22/2016    Dispense:  75 mL    Refill:  3  . benzonatate (TESSALON) 100 MG capsule    Sig: Take 1 capsule (100 mg total) by mouth every 8 (eight) hours.    Dispense:  60 capsule    Refill:  0  . SUMAtriptan (IMITREX) 100 MG tablet    Sig: At the onset of a migraine; may repeat in 2 hours. Max daily dose 232m    Dispense:  20 tablet    Refill:  1  . amLODipine (NORVASC) 10 MG tablet     Sig: Take 1 tablet (10 mg total) by mouth daily.    Dispense:  90 tablet    Refill:  0    Please d/c previous amlodipine scripts    Follow-up: Return in about 3 months (around 10/14/2017) for Follow-up of chronic medical conditions.   EArnoldo MoraleMD

## 2017-07-17 IMAGING — MR MR LUMBAR SPINE W/O CM
4 of 5 series · 26 of 48 positions shown · non-contrast
Comparison: Radiography 07/08/2016.  MRI 09/30/2013.

CLINICAL DATA: Chronic low back pain radiating to the right leg.
Some weakness and numbness on the left.

EXAM:
MRI LUMBAR SPINE WITHOUT CONTRAST
TECHNIQUE: Multiplanar, multisequence MR imaging of the lumbar spine was
performed. No intravenous contrast was administered.

[Series 3: T2 · sagittal · 4.0mm · 0.51mm/px · 6 of 13 slices shown (1 of 2)]
[im 1/13]
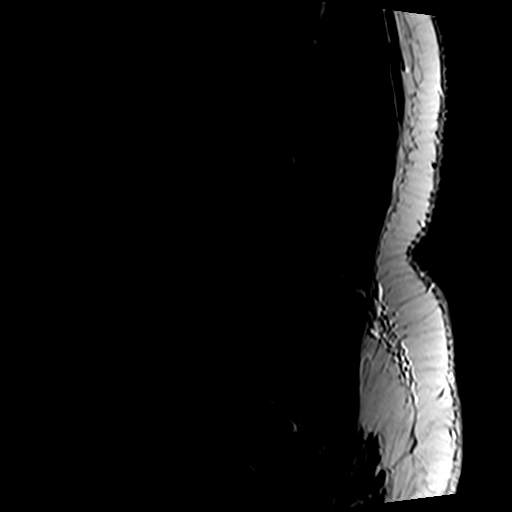
[im 3/13]
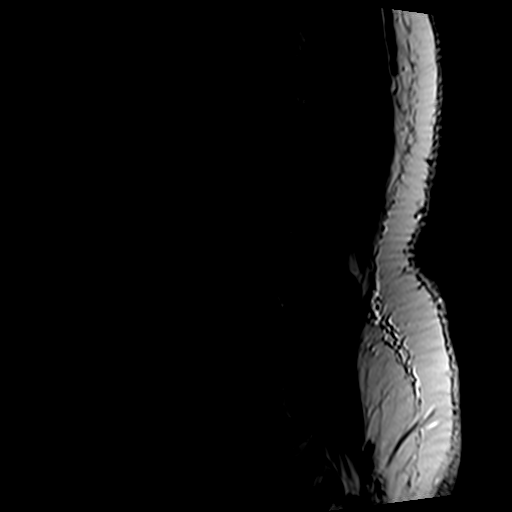
[im 5/13]
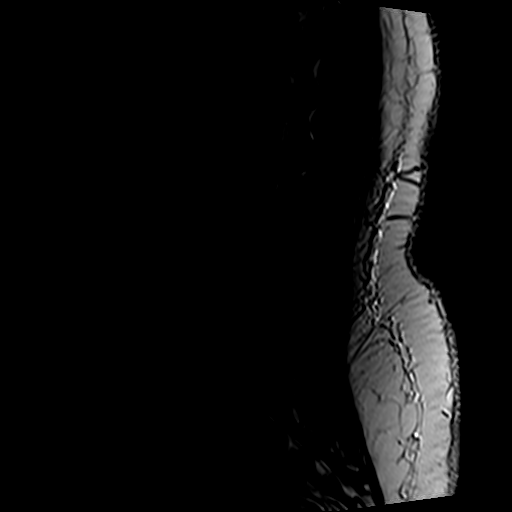
[im 8/13]
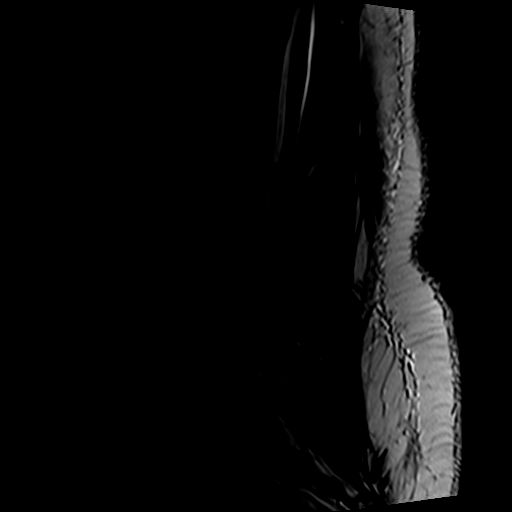
[im 10/13]
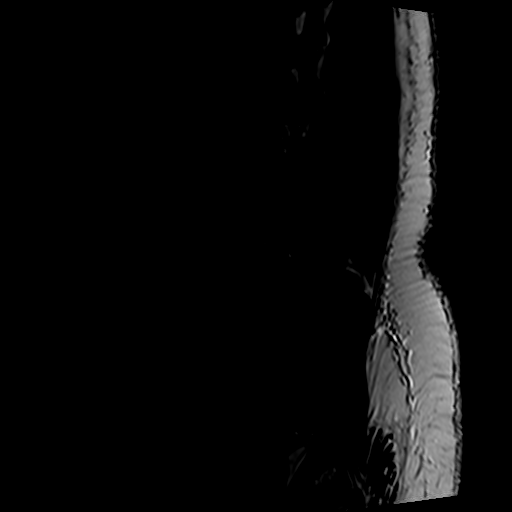
[im 13/13]
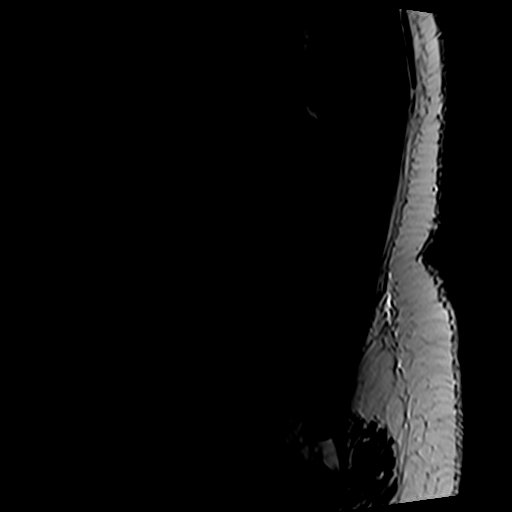

[Series 4: T1 · sagittal · 4.0mm · 0.51mm/px · 6 of 13 slices shown (1 of 2)]
[im 1/13]
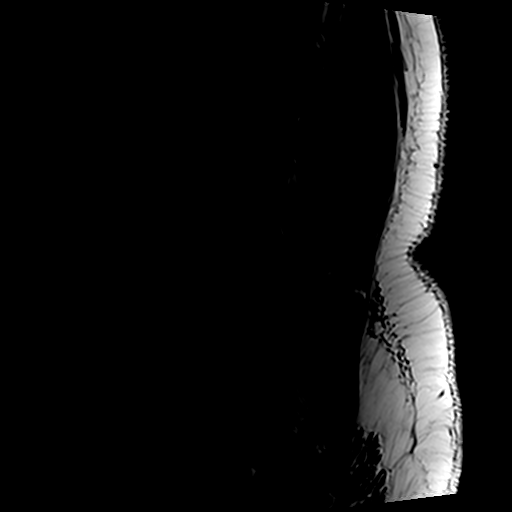
[im 3/13]
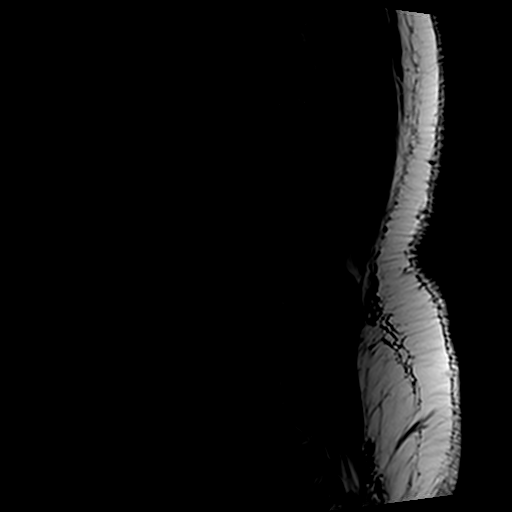
[im 5/13]
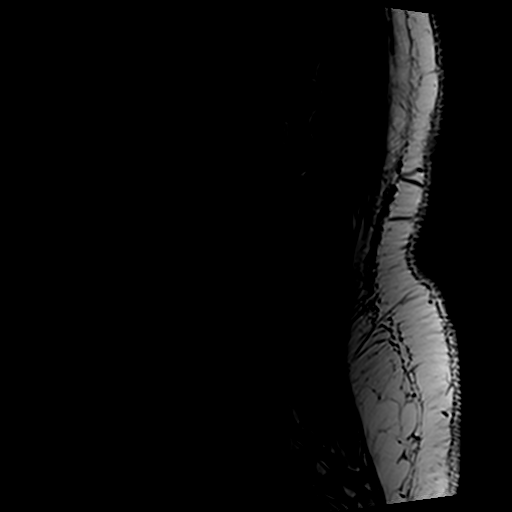
[im 8/13]
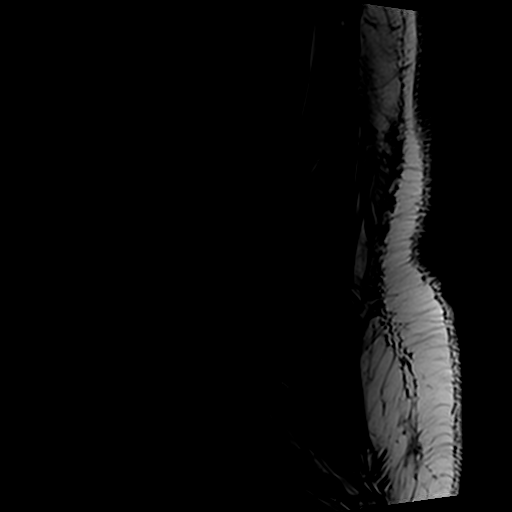
[im 10/13]
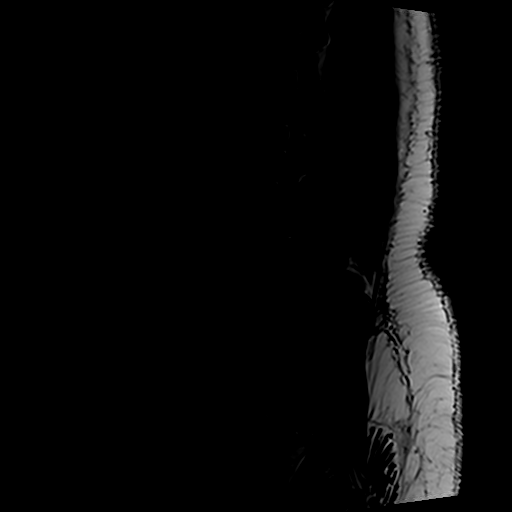
[im 13/13]
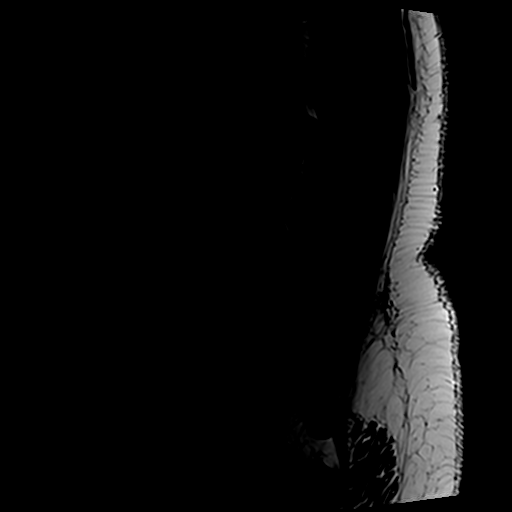

[Series 6: T2 · axial · 4.0mm · 0.70mm/px · z∈[-39,+117]mm · 9 of 30 slices shown (2 of 2)]
[im 1/30]
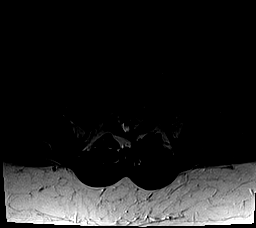
[im 5/30]
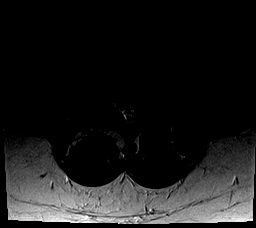
[im 9/30]
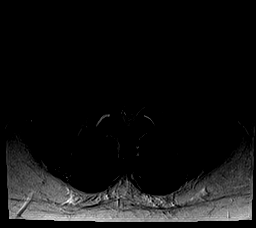
[im 13/30]
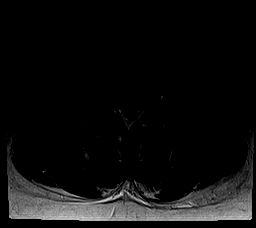
[im 15/30]
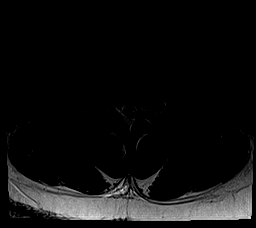
[im 17/30]
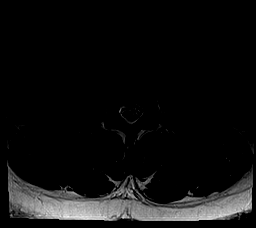
[im 21/30]
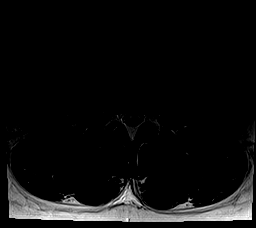
[im 25/30]
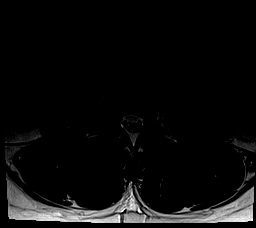
[im 30/30]
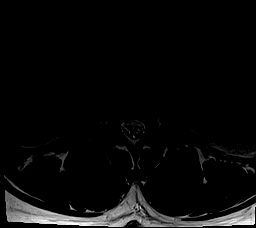

[Series 7: T1 · axial · 4.0mm · 0.35mm/px · z∈[-39,+92]mm · 5 of 30 slices shown (2 of 2)]
[im 1/30]
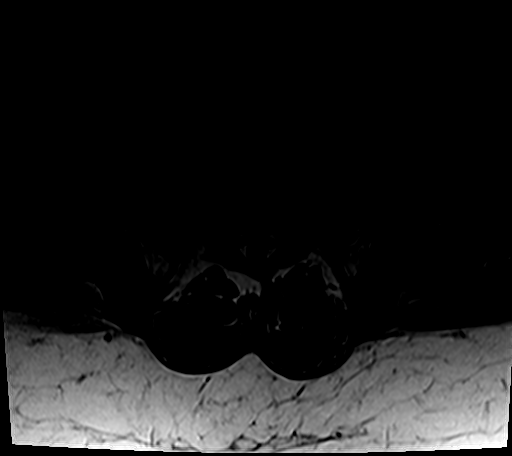
[im 5/30]
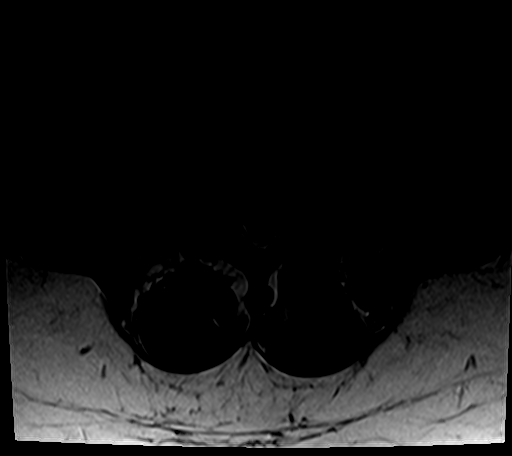
[im 9/30]
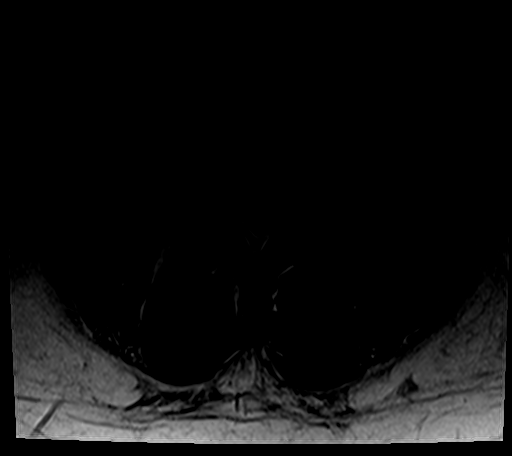
[im 15/30]
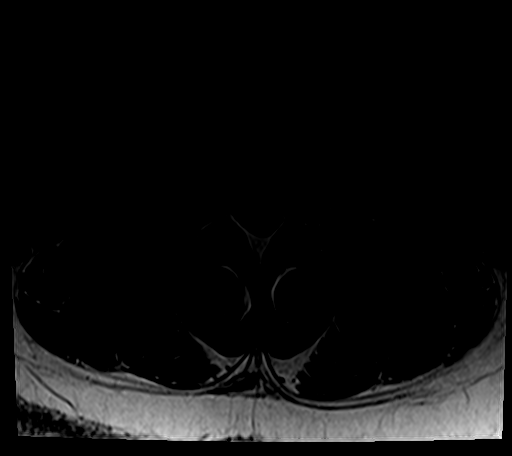
[im 25/30]
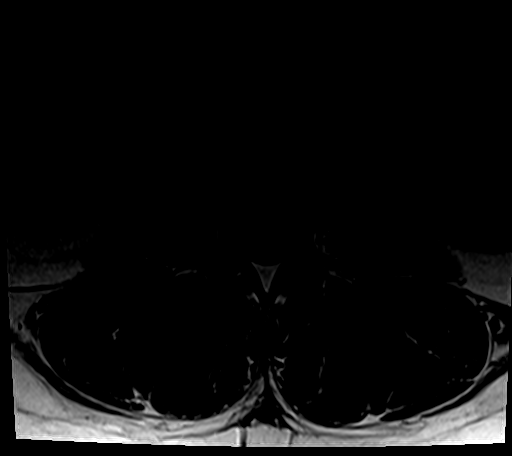

[26 of 48 positions shown; findings below may reference images not displayed]

FINDINGS: Segmentation:  5 lumbar type vertebral bodies.

Alignment:  Minimal curvature convex to the right.

Vertebrae:  No fracture or primary bone lesion.

Conus medullaris: Extends to the L1 level and appears normal.

Paraspinal and other soft tissues: Negative

Disc levels:

No abnormality at L3-4 or above. The discs are normal. The canal and
foramina are widely patent. The distal cord and conus are normal.

L4-5: Disc degeneration with shallow broad-based disc herniation
with slight upward turning. Facet arthropathy with fluid-filled
joints. Abundant epidural fat. Mild stenosis of the lateral recesses
without definite neural compression. Anterolisthesis would likely
occur with standing or flexion.

L5-S1: No disc pathology.  Minimal facet hypertrophy.  No stenosis.

Compared to the study of 8002, the appearance of the disc is
unchanged. The facet arthropathy is worsened slightly.
IMPRESSION: L4-5: Shallow broad-based disc herniation with slight upward
turning, unchanged since 8002. Bilateral facet arthropathy with
fluid-filled joints, worsened since 8002. Abundant epidural fat.
Mild constriction of the thecal sac without definite neural
compression. This appearance could worsen with standing or flexion
based on the morphology of the facet arthropathy.

## 2017-07-21 DIAGNOSIS — E119 Type 2 diabetes mellitus without complications: Secondary | ICD-10-CM | POA: Diagnosis not present

## 2017-07-21 DIAGNOSIS — I5030 Unspecified diastolic (congestive) heart failure: Secondary | ICD-10-CM | POA: Diagnosis not present

## 2017-07-21 DIAGNOSIS — I110 Hypertensive heart disease with heart failure: Secondary | ICD-10-CM | POA: Diagnosis not present

## 2017-07-21 DIAGNOSIS — Z430 Encounter for attention to tracheostomy: Secondary | ICD-10-CM | POA: Diagnosis not present

## 2017-07-21 DIAGNOSIS — Z87891 Personal history of nicotine dependence: Secondary | ICD-10-CM | POA: Diagnosis not present

## 2017-07-21 DIAGNOSIS — Z930 Tracheostomy status: Secondary | ICD-10-CM | POA: Diagnosis not present

## 2017-07-21 DIAGNOSIS — F329 Major depressive disorder, single episode, unspecified: Secondary | ICD-10-CM | POA: Diagnosis not present

## 2017-07-21 DIAGNOSIS — R0989 Other specified symptoms and signs involving the circulatory and respiratory systems: Secondary | ICD-10-CM | POA: Diagnosis not present

## 2017-07-21 DIAGNOSIS — J386 Stenosis of larynx: Secondary | ICD-10-CM | POA: Diagnosis not present

## 2017-07-21 DIAGNOSIS — J45901 Unspecified asthma with (acute) exacerbation: Secondary | ICD-10-CM | POA: Diagnosis not present

## 2017-07-21 DIAGNOSIS — Z01818 Encounter for other preprocedural examination: Secondary | ICD-10-CM | POA: Diagnosis not present

## 2017-07-21 DIAGNOSIS — M479 Spondylosis, unspecified: Secondary | ICD-10-CM | POA: Diagnosis not present

## 2017-07-21 DIAGNOSIS — G4733 Obstructive sleep apnea (adult) (pediatric): Secondary | ICD-10-CM | POA: Diagnosis not present

## 2017-07-21 DIAGNOSIS — K219 Gastro-esophageal reflux disease without esophagitis: Secondary | ICD-10-CM | POA: Diagnosis not present

## 2017-07-21 DIAGNOSIS — Z794 Long term (current) use of insulin: Secondary | ICD-10-CM | POA: Diagnosis not present

## 2017-07-21 DIAGNOSIS — E785 Hyperlipidemia, unspecified: Secondary | ICD-10-CM | POA: Diagnosis not present

## 2017-07-27 ENCOUNTER — Ambulatory Visit: Payer: Medicare Other | Admitting: Endocrinology

## 2017-07-27 DIAGNOSIS — Z87891 Personal history of nicotine dependence: Secondary | ICD-10-CM | POA: Diagnosis not present

## 2017-07-27 DIAGNOSIS — I110 Hypertensive heart disease with heart failure: Secondary | ICD-10-CM | POA: Diagnosis not present

## 2017-07-27 DIAGNOSIS — I5030 Unspecified diastolic (congestive) heart failure: Secondary | ICD-10-CM | POA: Diagnosis not present

## 2017-07-27 DIAGNOSIS — J386 Stenosis of larynx: Secondary | ICD-10-CM | POA: Diagnosis not present

## 2017-07-27 DIAGNOSIS — J45901 Unspecified asthma with (acute) exacerbation: Secondary | ICD-10-CM | POA: Diagnosis not present

## 2017-07-27 DIAGNOSIS — J988 Other specified respiratory disorders: Secondary | ICD-10-CM | POA: Diagnosis not present

## 2017-07-27 DIAGNOSIS — Z930 Tracheostomy status: Secondary | ICD-10-CM | POA: Diagnosis not present

## 2017-08-04 ENCOUNTER — Ambulatory Visit: Payer: Medicare Other | Admitting: Family Medicine

## 2017-08-24 DIAGNOSIS — Z930 Tracheostomy status: Secondary | ICD-10-CM | POA: Diagnosis not present

## 2017-08-24 DIAGNOSIS — J398 Other specified diseases of upper respiratory tract: Secondary | ICD-10-CM | POA: Diagnosis not present

## 2017-08-24 DIAGNOSIS — Z87891 Personal history of nicotine dependence: Secondary | ICD-10-CM | POA: Diagnosis not present

## 2017-09-09 ENCOUNTER — Ambulatory Visit: Payer: Medicare Other | Admitting: Endocrinology

## 2017-09-09 DIAGNOSIS — Z930 Tracheostomy status: Secondary | ICD-10-CM | POA: Diagnosis not present

## 2017-09-09 DIAGNOSIS — E1142 Type 2 diabetes mellitus with diabetic polyneuropathy: Secondary | ICD-10-CM | POA: Diagnosis not present

## 2017-09-09 DIAGNOSIS — J45909 Unspecified asthma, uncomplicated: Secondary | ICD-10-CM | POA: Diagnosis not present

## 2017-09-09 DIAGNOSIS — F329 Major depressive disorder, single episode, unspecified: Secondary | ICD-10-CM | POA: Diagnosis not present

## 2017-09-09 DIAGNOSIS — G894 Chronic pain syndrome: Secondary | ICD-10-CM | POA: Diagnosis not present

## 2017-09-09 DIAGNOSIS — Z794 Long term (current) use of insulin: Secondary | ICD-10-CM | POA: Diagnosis not present

## 2017-09-09 DIAGNOSIS — I959 Hypotension, unspecified: Secondary | ICD-10-CM | POA: Diagnosis not present

## 2017-09-09 DIAGNOSIS — Z6841 Body Mass Index (BMI) 40.0 and over, adult: Secondary | ICD-10-CM | POA: Diagnosis not present

## 2017-09-09 DIAGNOSIS — J386 Stenosis of larynx: Secondary | ICD-10-CM | POA: Diagnosis not present

## 2017-09-09 DIAGNOSIS — J398 Other specified diseases of upper respiratory tract: Secondary | ICD-10-CM | POA: Diagnosis not present

## 2017-09-09 DIAGNOSIS — I5032 Chronic diastolic (congestive) heart failure: Secondary | ICD-10-CM | POA: Diagnosis not present

## 2017-09-09 DIAGNOSIS — I110 Hypertensive heart disease with heart failure: Secondary | ICD-10-CM | POA: Diagnosis not present

## 2017-09-09 DIAGNOSIS — E1165 Type 2 diabetes mellitus with hyperglycemia: Secondary | ICD-10-CM | POA: Diagnosis not present

## 2017-09-09 DIAGNOSIS — G4733 Obstructive sleep apnea (adult) (pediatric): Secondary | ICD-10-CM | POA: Diagnosis not present

## 2017-09-09 DIAGNOSIS — K219 Gastro-esophageal reflux disease without esophagitis: Secondary | ICD-10-CM | POA: Diagnosis not present

## 2017-09-09 DIAGNOSIS — G43909 Migraine, unspecified, not intractable, without status migrainosus: Secondary | ICD-10-CM | POA: Diagnosis not present

## 2017-09-18 IMAGING — DX DG CHEST 1V PORT
1 series · 1 of 1 positions shown · non-contrast
Comparison: 08/22/2015

CLINICAL DATA: Shortness of breath and cough for 2 days

EXAM:
PORTABLE CHEST 1 VIEW

[chest]
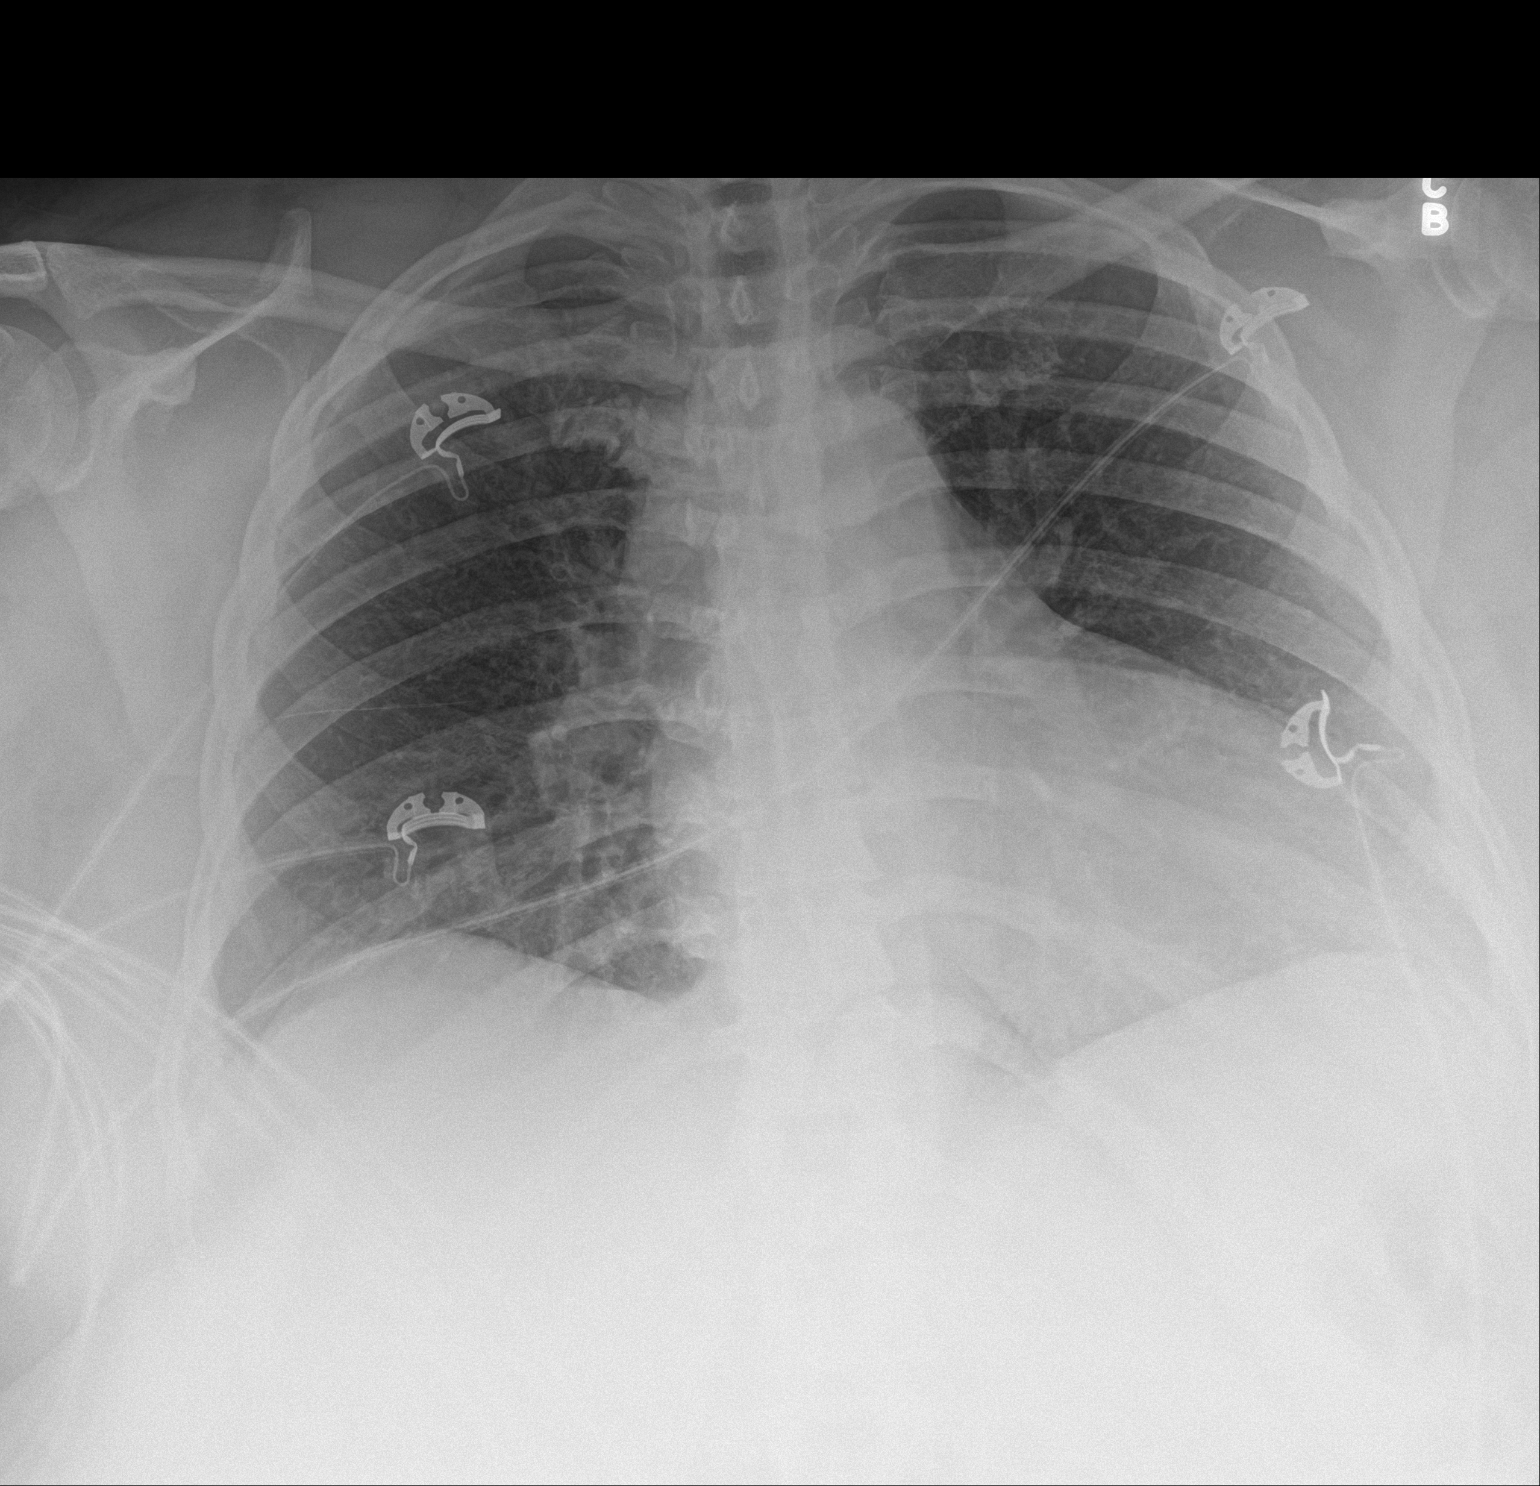

[1 of 1 positions shown; findings below may reference images not displayed]

FINDINGS: The heart size and mediastinal contours are within normal limits.
Both lungs are clear. The visualized skeletal structures are
unremarkable.
IMPRESSION: No active disease.

## 2017-09-19 IMAGING — DX DG CHEST 1V PORT
1 series · 1 of 1 positions shown · non-contrast
Comparison: 02/19/2017

CLINICAL DATA: Status post central line placement

EXAM:
PORTABLE CHEST 1 VIEW

[chest ap]
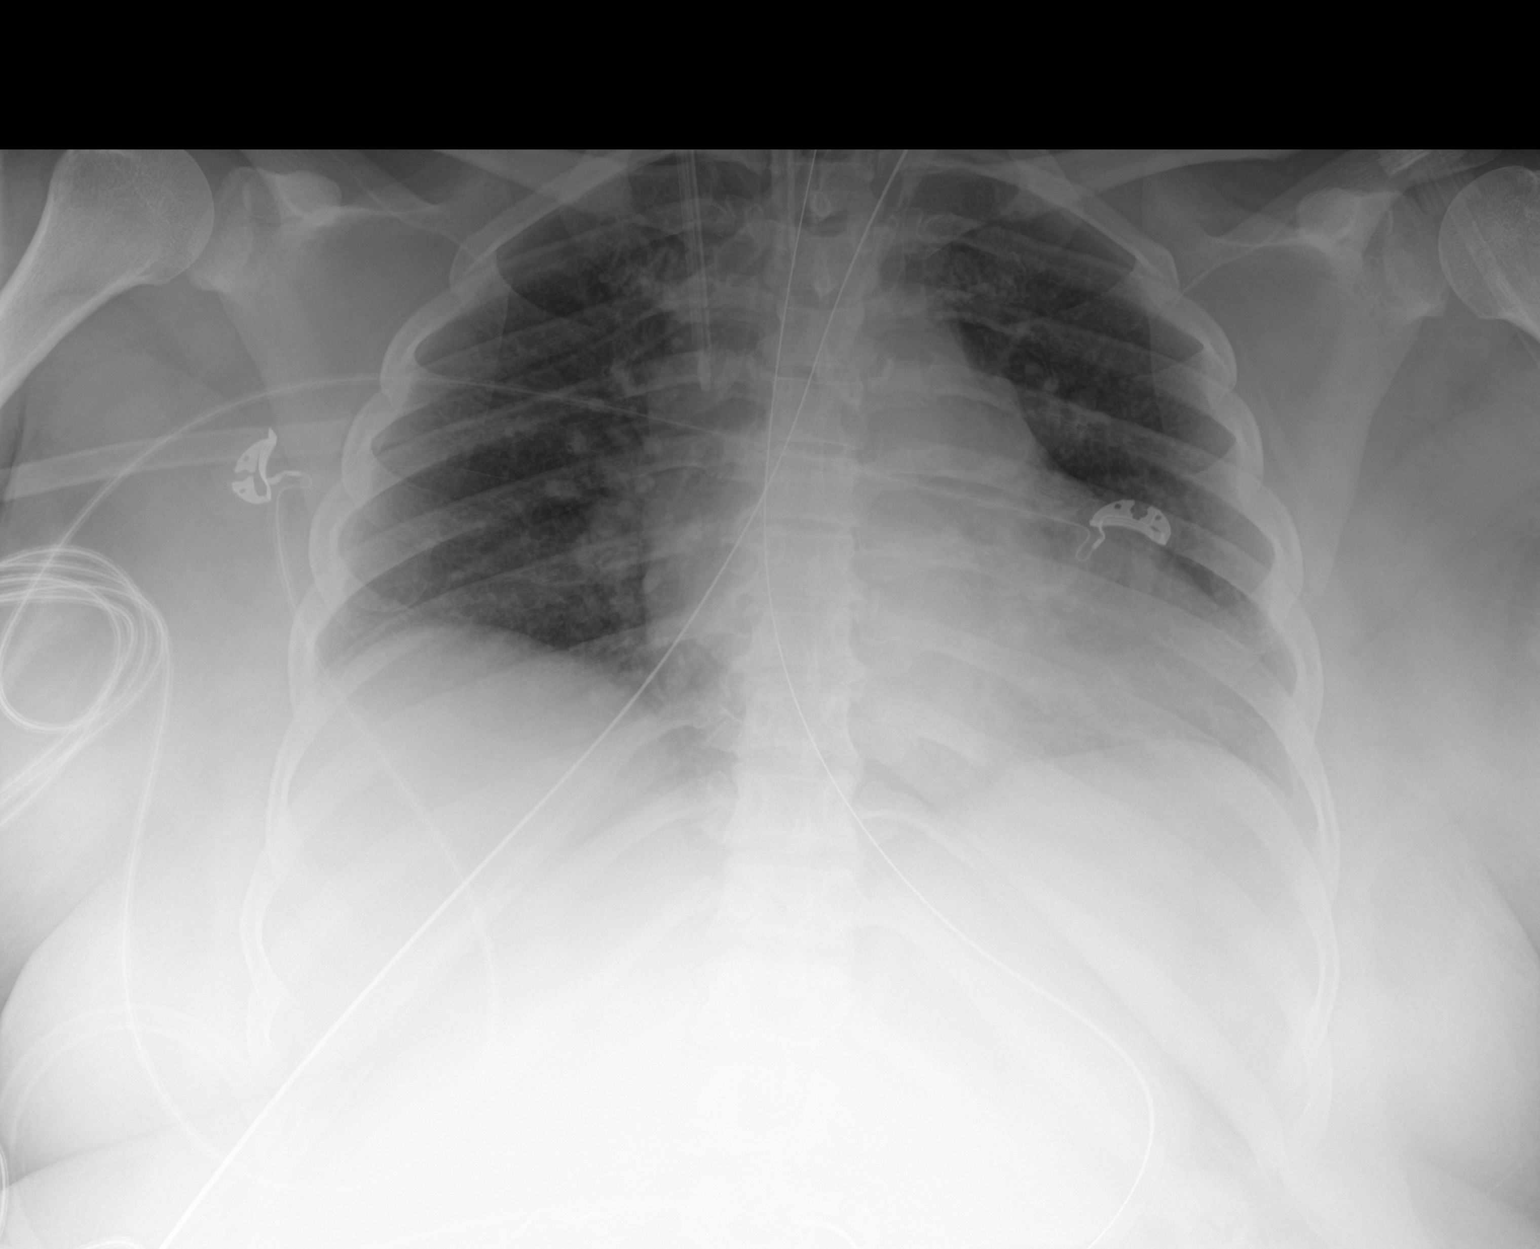

[1 of 1 positions shown; findings below may reference images not displayed]

FINDINGS: Cardiac shadow remains enlarged. Endotracheal tube and nasogastric
catheter are noted in satisfactory position. A right jugular central
line is noted in the mid superior vena cava. No pneumothorax is
seen. The overall inspiratory effort is poor with crowding vascular
markings. No focal infiltrate is seen.
IMPRESSION: No pneumothorax following central line placement.

## 2017-09-20 IMAGING — CR DG CHEST 1V PORT
1 series · 1 of 1 positions shown · non-contrast
Comparison: 02/19/2017.

CLINICAL DATA: Respiratory failure.

EXAM:
PORTABLE CHEST 1 VIEW

[AP]
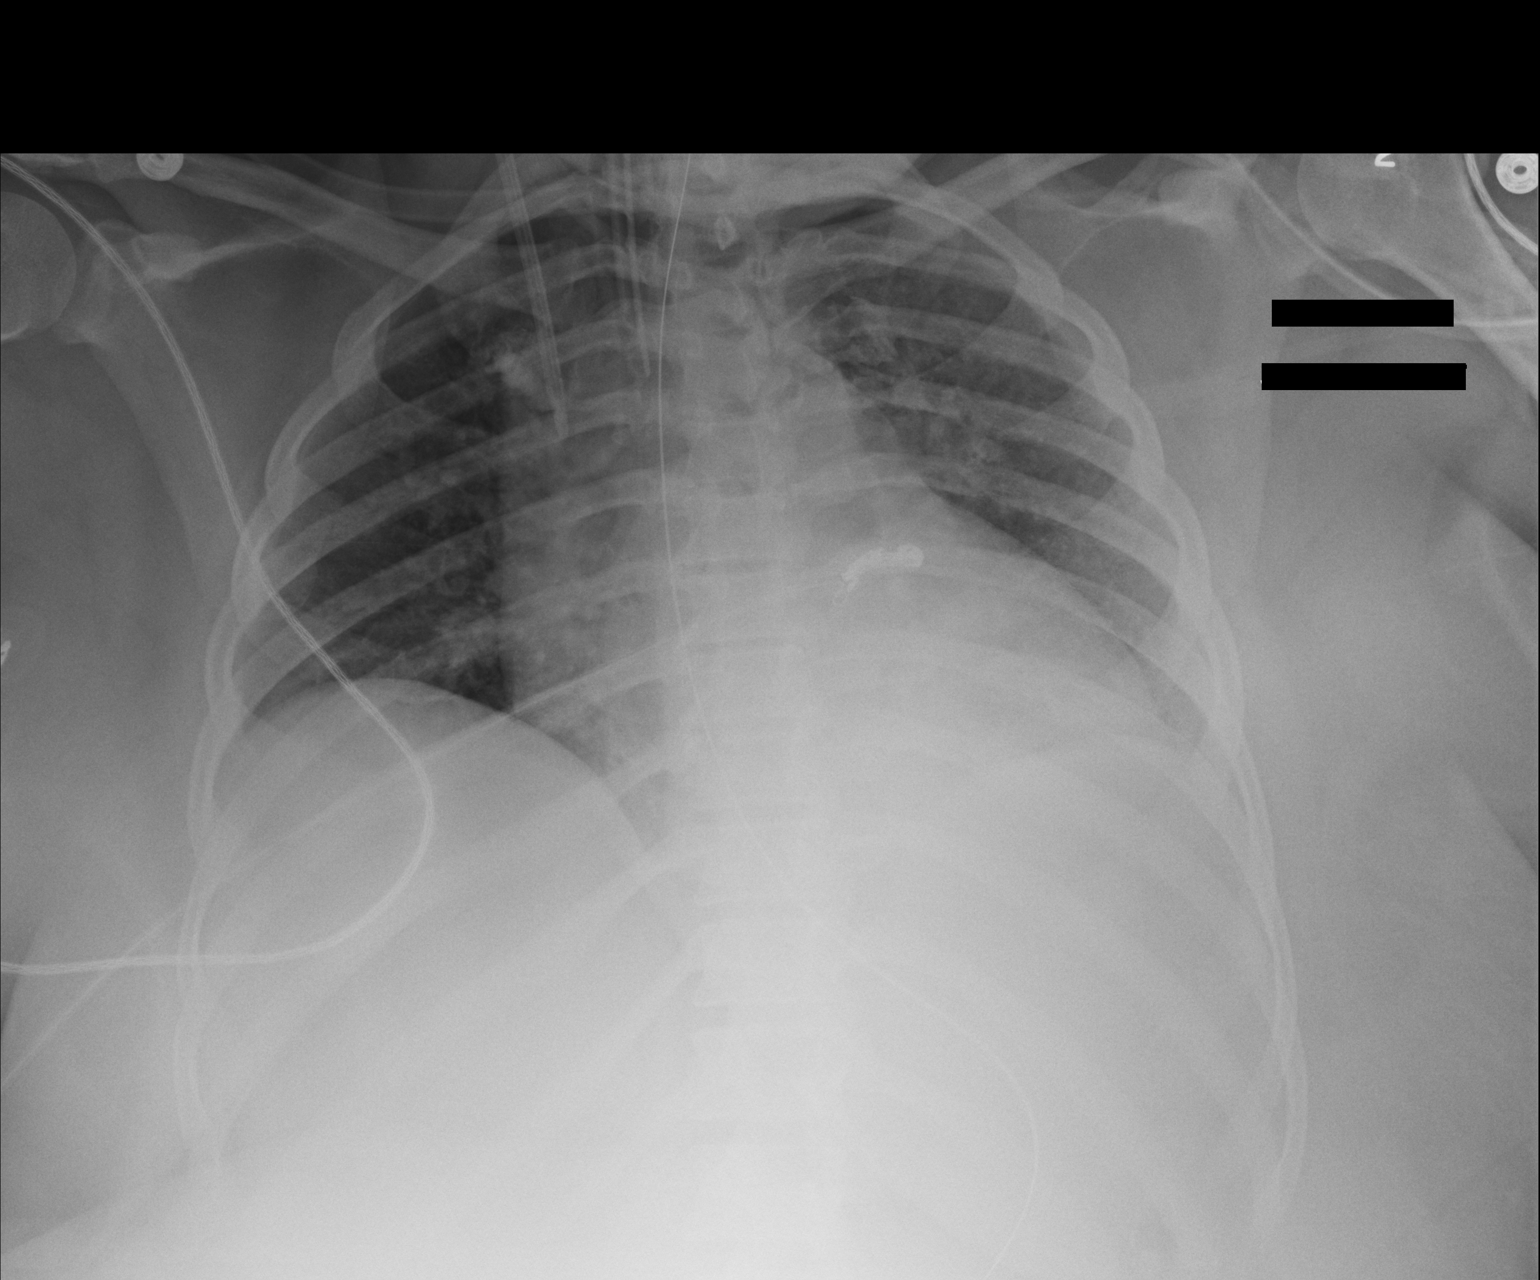

[1 of 1 positions shown; findings below may reference images not displayed]

FINDINGS: Endotracheal tube and NG tube in stable position. Right IJ line in
stable position. Cardiomegaly with pulmonary vascular prominence.
Low lung volumes.
IMPRESSION: 1. Lines and tubes in stable position.

2. Low lung volumes. Cardiomegaly with pulmonary vascular
prominence. No overt pulmonary edema. Chest is stable from prior
exam.

## 2017-09-21 IMAGING — CR DG CHEST 1V PORT
1 series · 1 of 1 positions shown · non-contrast
Comparison: Chest x-rays dated 02/20/2017 and 02/18/2017.

CLINICAL DATA: Pneumonia, diabetes, HTN, on vent

EXAM:
PORTABLE CHEST 1 VIEW

[AP]
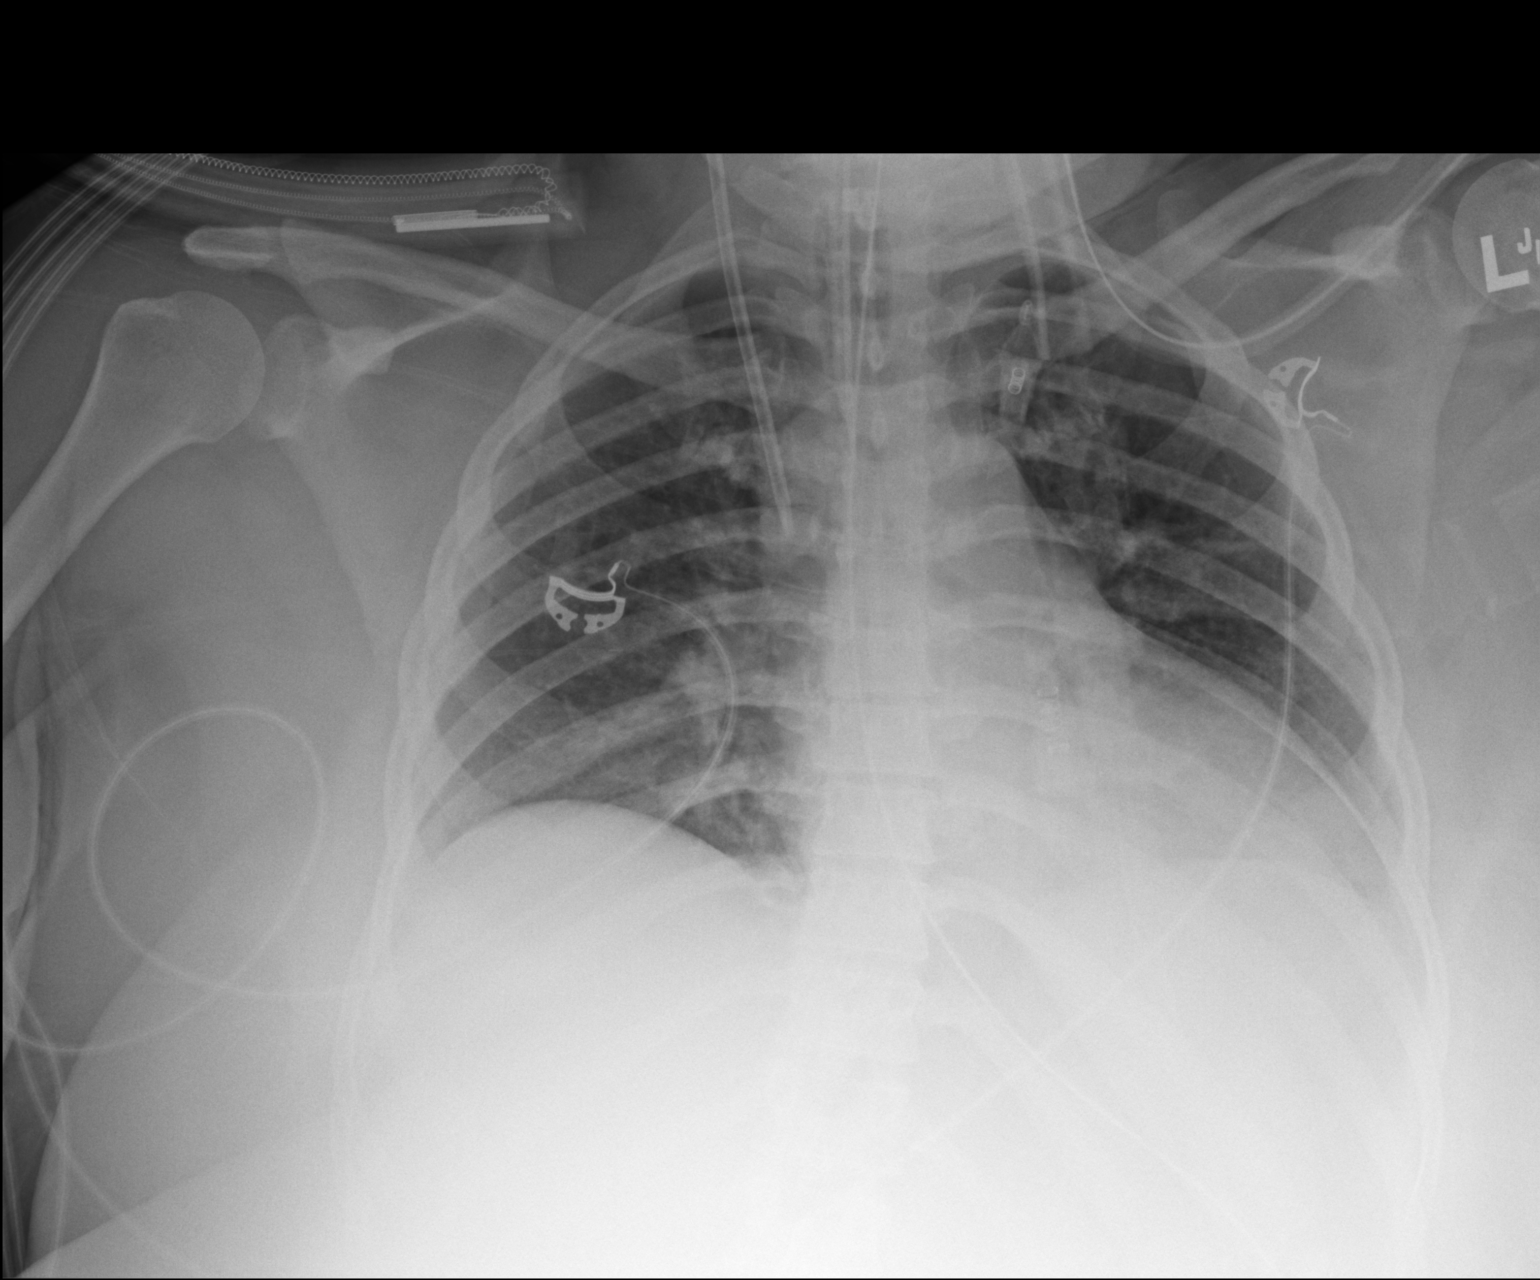

[1 of 1 positions shown; findings below may reference images not displayed]

FINDINGS: Right IJ line appears stable in position with tip at the level of
the upper SVC. Endotracheal tube remains well positioned with tip
above the level of the carina. Enteric tube passes below the
diaphragm.

Mild cardiomegaly is stable. Mild central pulmonary vascular
congestion is stable, without overt alveolar pulmonary edema.
Suspect small left pleural effusion. No new lung findings seen.
IMPRESSION: Stable exam. Persistent mild central pulmonary vascular congestion
without overt alveolar pulmonary edema. Suspect small left pleural
effusion. Support apparatus is stable in position.

## 2017-09-22 IMAGING — DX DG CHEST 1V PORT
1 series · 1 of 1 positions shown · non-contrast
Comparison: 02/21/2017 and prior exams

CLINICAL DATA: Respiratory failure.

EXAM:
PORTABLE CHEST 1 VIEW

[chest]
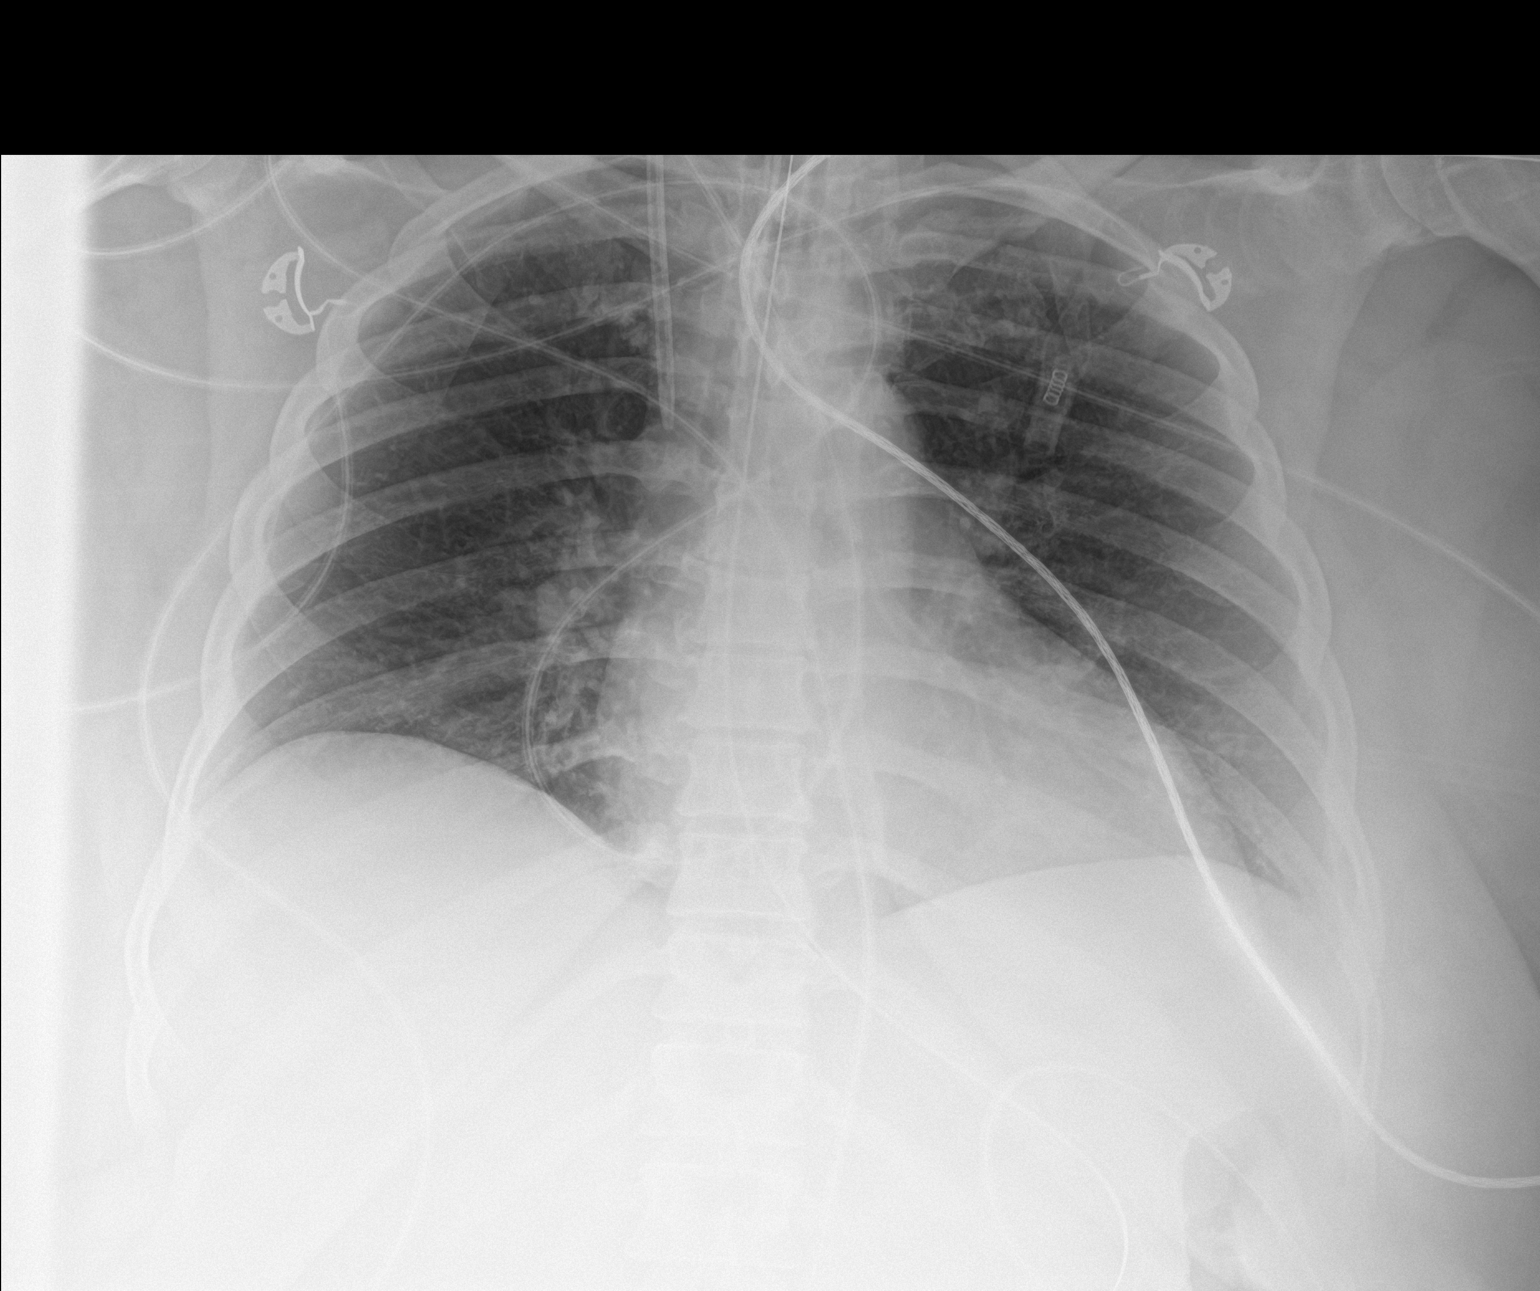

[1 of 1 positions shown; findings below may reference images not displayed]

FINDINGS: An endotracheal tube with tip 2 cm above carina, right IJ central
venous catheter with tip overlying the upper SVC and NG tube
entering the stomach again noted.

Upper limits normal heart size noted.

Improved left basilar aeration and decreased vascular congestion
noted.

There is no evidence of pneumothorax.
IMPRESSION: Improved left basilar aeration with decreased pulmonary vascular
congestion.

## 2017-09-23 IMAGING — CR DG CHEST 1V PORT
1 series · 1 of 1 positions shown · non-contrast
Comparison: 02/22/2017.

CLINICAL DATA: Chronic respiratory failure.

EXAM:
PORTABLE CHEST 1 VIEW

[AP]
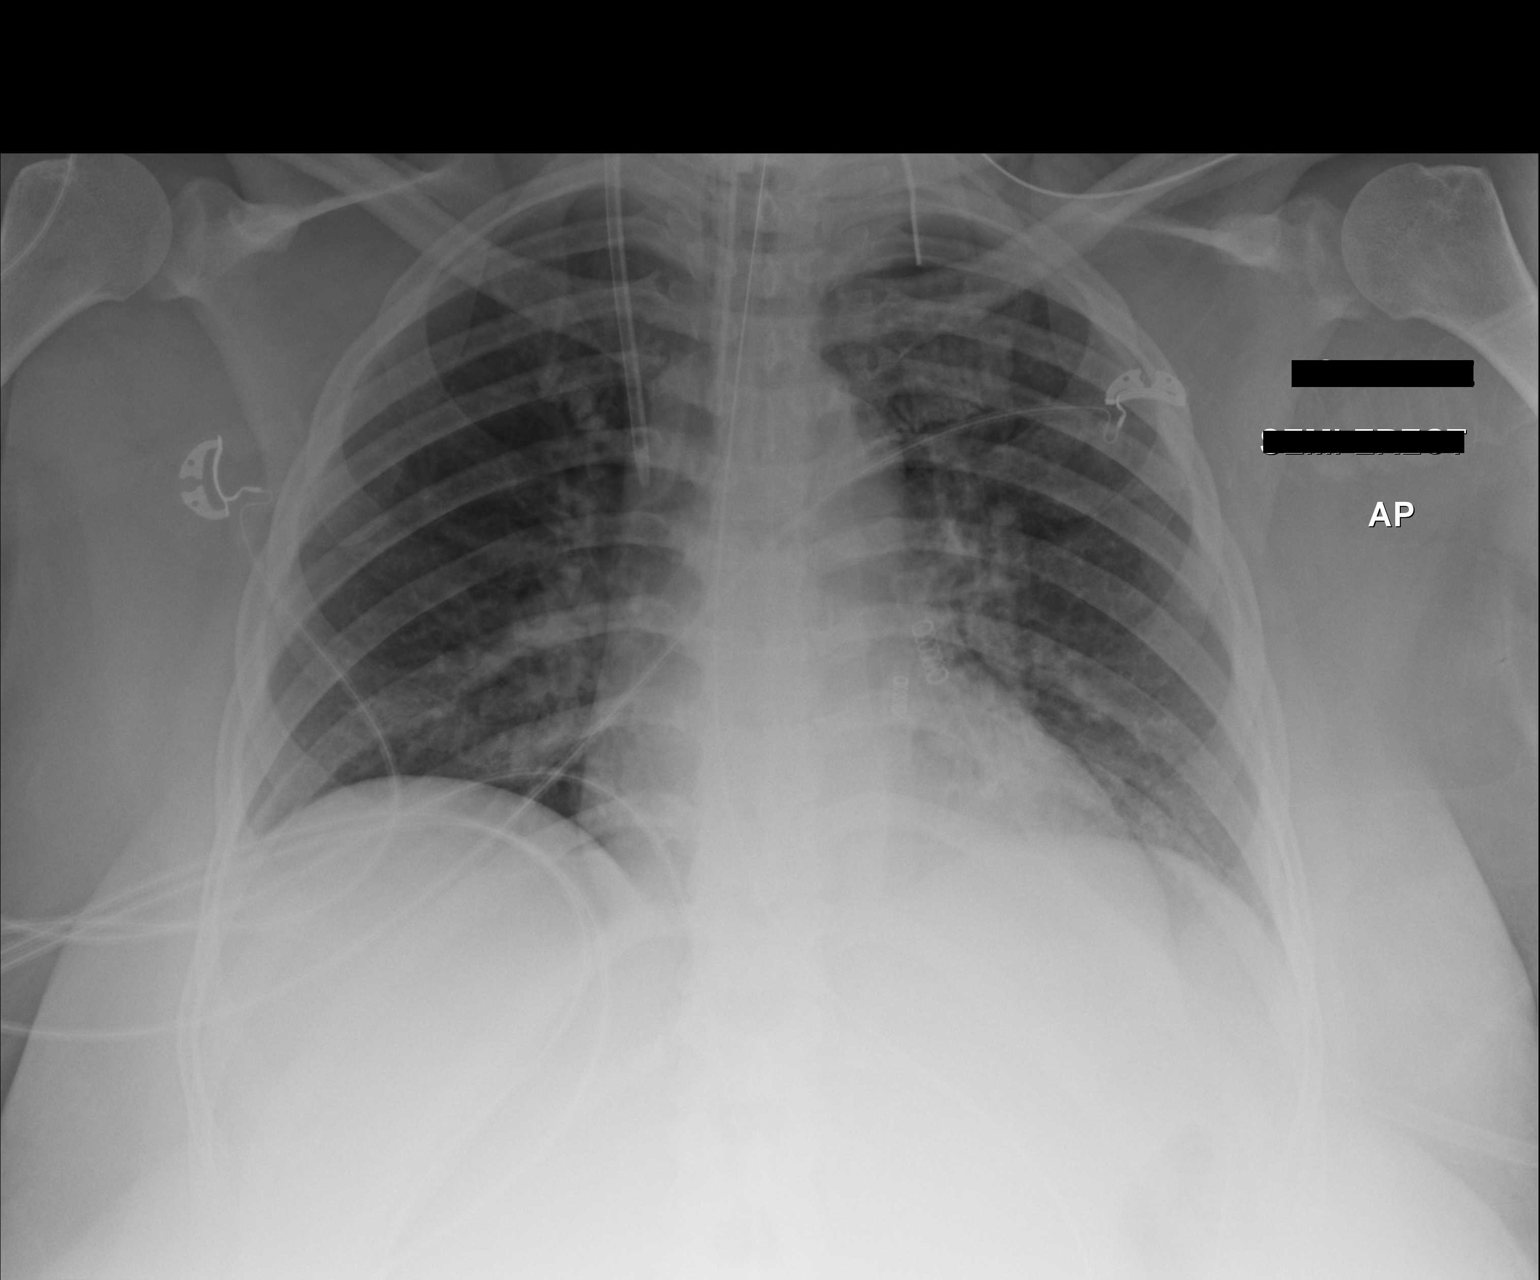

[1 of 1 positions shown; findings below may reference images not displayed]

FINDINGS: Endotracheal tube, NG tube, right IJ line stable position. Low lung
volumes. Mild infiltrate left upper lobe and both lung bases. No
pleural effusion or pneumothorax. Heart size stable.
IMPRESSION: 1. Lines and tubes in stable position.

2. Low lung volumes. Mild infiltrates left upper lobe and both lung
bases.

## 2017-09-24 IMAGING — CR DG CHEST 1V PORT
1 series · 1 of 1 positions shown · non-contrast
Comparison: Beneath portable chest x-ray February 23, 2017

CLINICAL DATA: Acute on chronic respiratory failure

EXAM:
PORTABLE CHEST 1 VIEW

[AP]
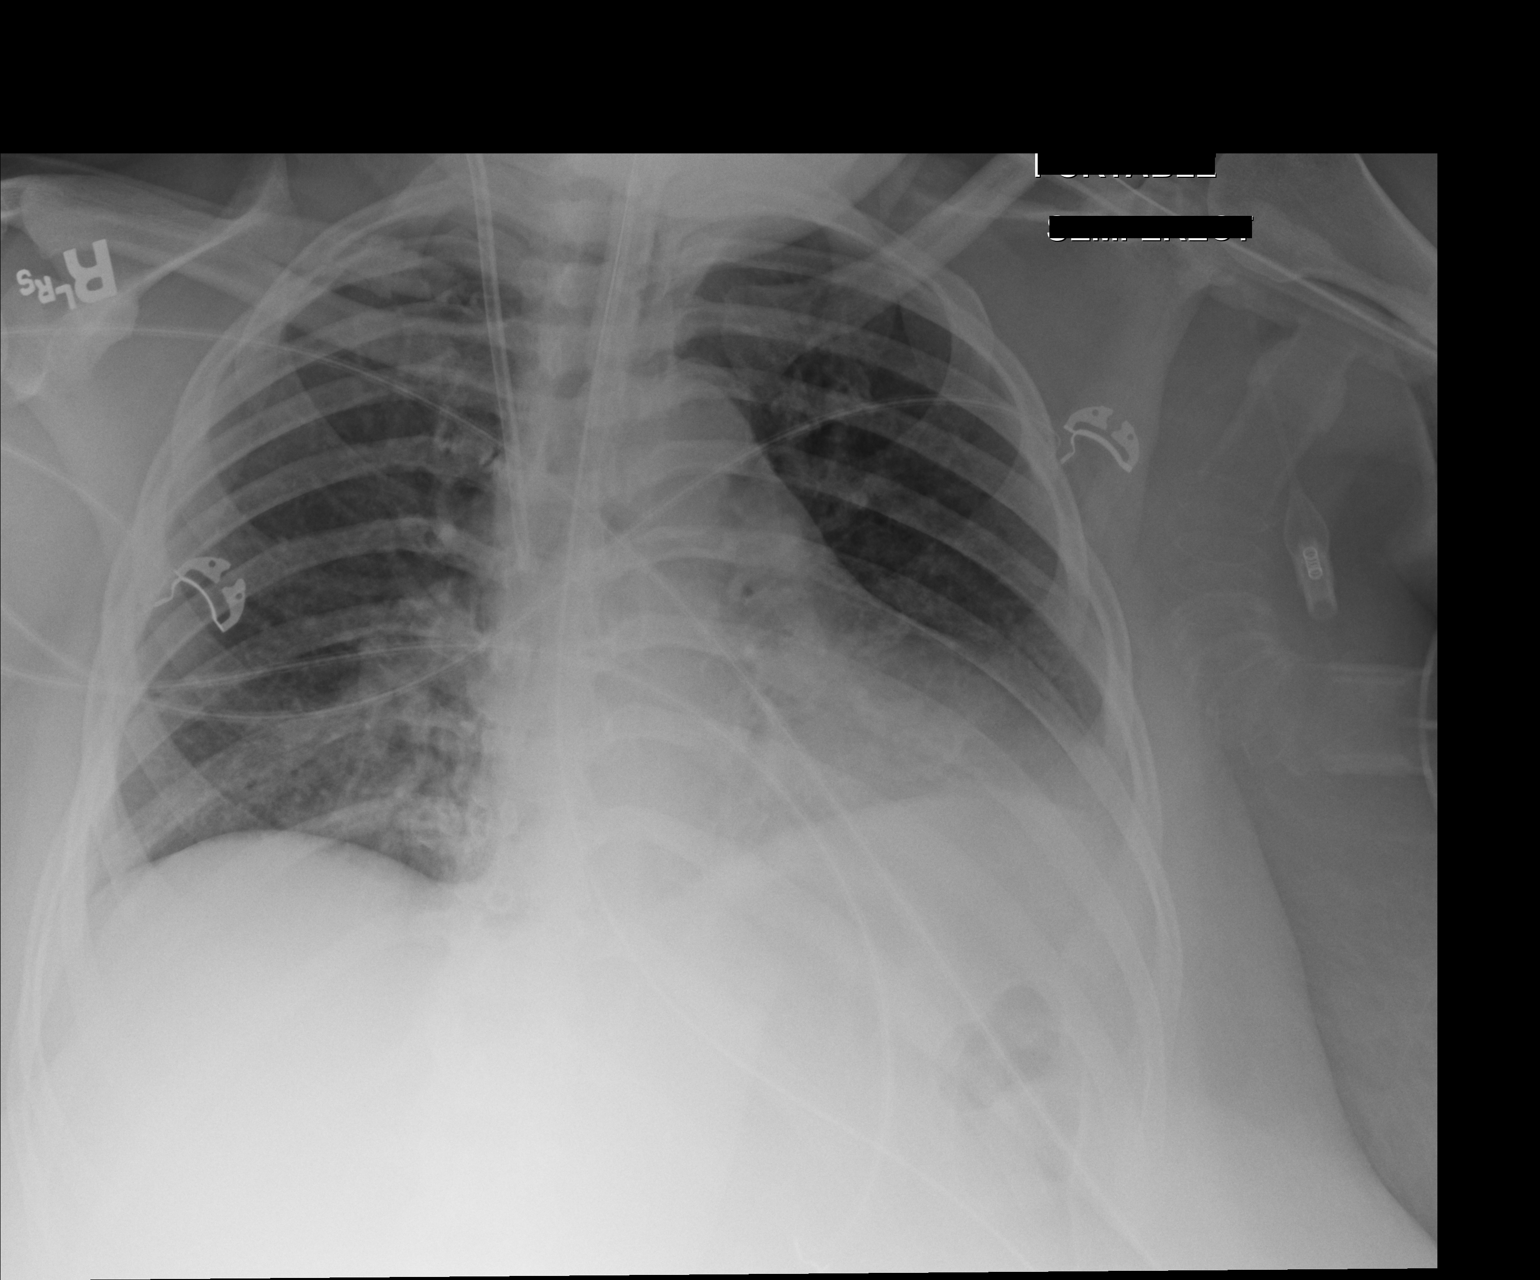

[1 of 1 positions shown; findings below may reference images not displayed]

FINDINGS: The lungs arm adequately inflated. Bibasilar interstitial densities
are present and more conspicuous today with partial obscuration of
the left hemidiaphragm. The cardiac silhouette is enlarged. The
pulmonary vascularity is not engorged. The endotracheal tube tip
lies approximately 4.5 cm above the carina. The esophagogastric tube
tip in proximal port project below the inferior margin of the image.
The right internal jugular catheter tip projects over the junction
of the proximal and midportions of the SVC.
IMPRESSION: Slight interval increase in bibasilar subsegmental atelectasis.
There may be small left pleural effusion. The support tubes are in
reasonable position.

## 2017-09-25 IMAGING — CR DG CHEST 1V PORT
1 series · 1 of 1 positions shown · non-contrast
Comparison: 02/24/2017.

CLINICAL DATA: Respiratory failure.

EXAM:
PORTABLE CHEST 1 VIEW

[AP]
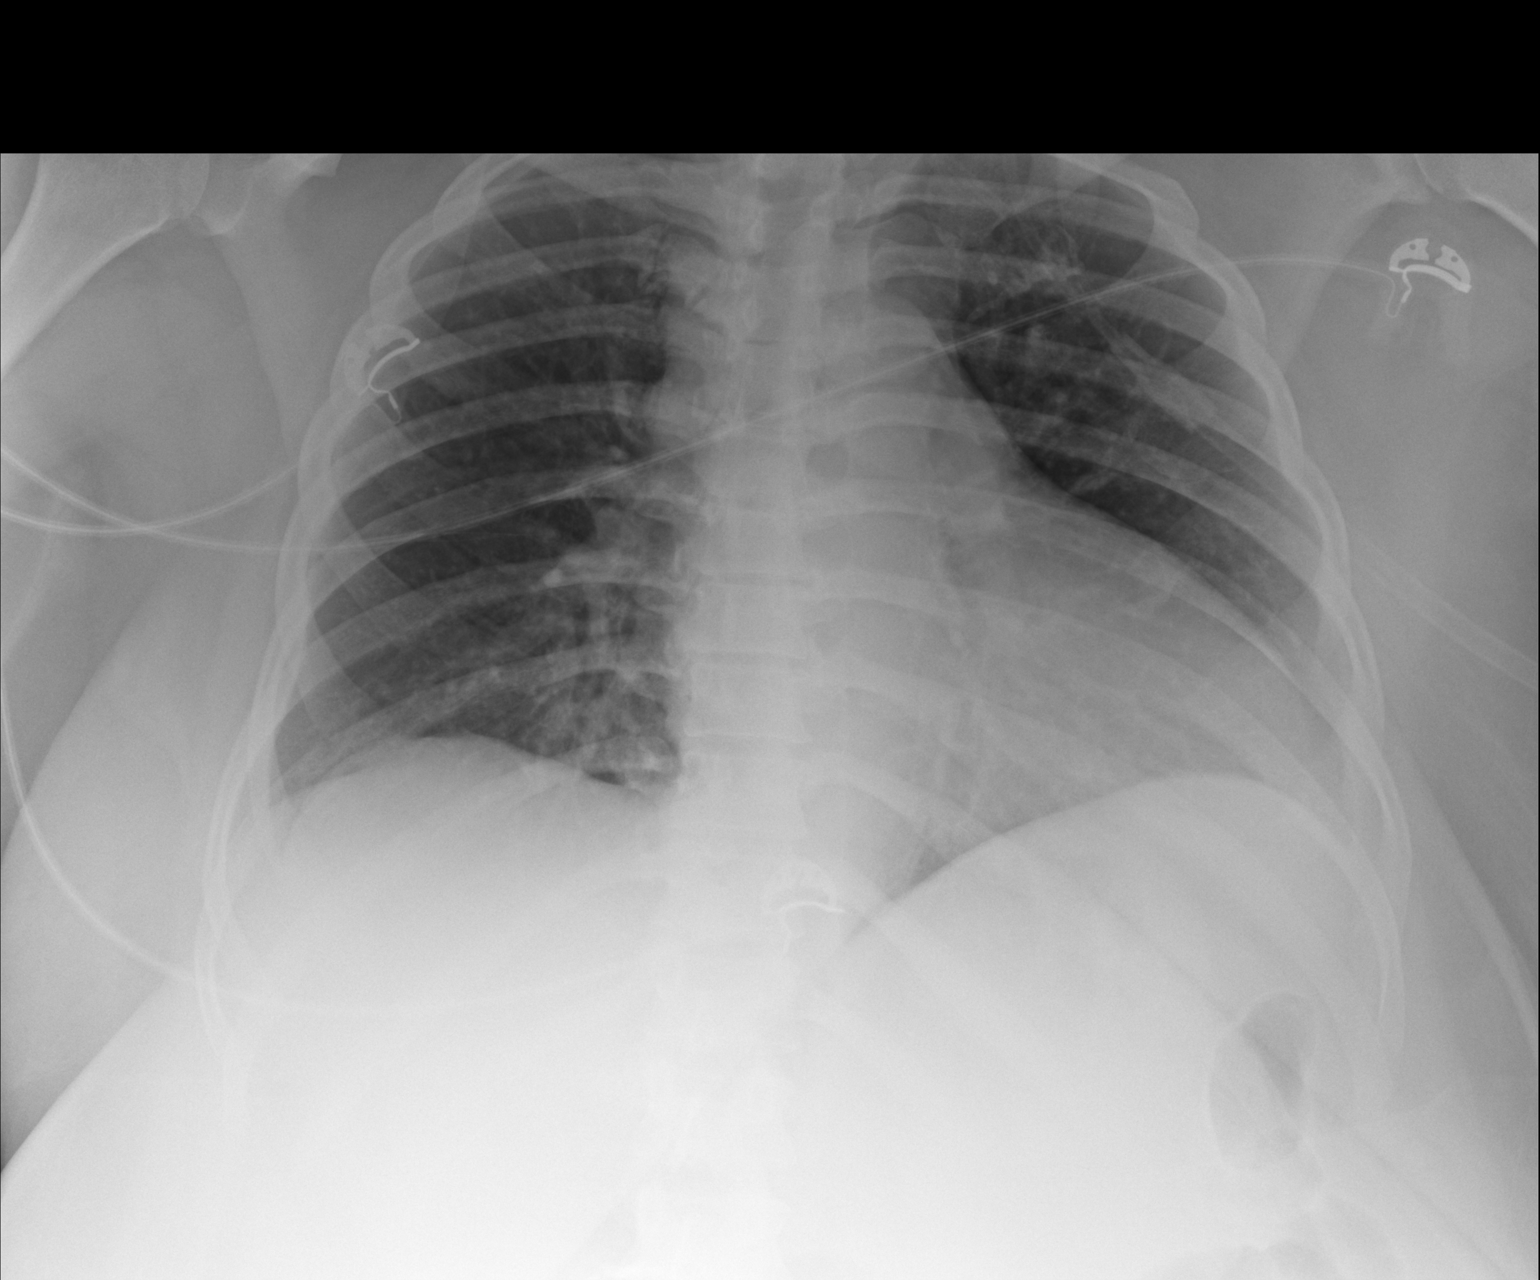

[1 of 1 positions shown; findings below may reference images not displayed]

FINDINGS: Interim removal of endotracheal tube and NG tube. Interim removal of
right IJ line. Mediastinum and hilar structures are normal.
Cardiomegaly with normal pulmonary vascularity. Low lung volumes.
Improved bibasilar atelectasis . Small left pleural effusion. No
pneumothorax.
IMPRESSION: 1. Interim extubation and removal of NG tube. Interim removal of
right IJ line .

2. Stable cardiomegaly.

3. Lung volumes. Improved bibasilar atelectasis. Small left pleural
effusion again noted.

## 2017-09-27 IMAGING — CR DG CHEST 1V
1 series · 1 of 1 positions shown · non-contrast
Comparison: 02/25/2017.

CLINICAL DATA: Cough.

EXAM:
CHEST 1 VIEW

[AP]
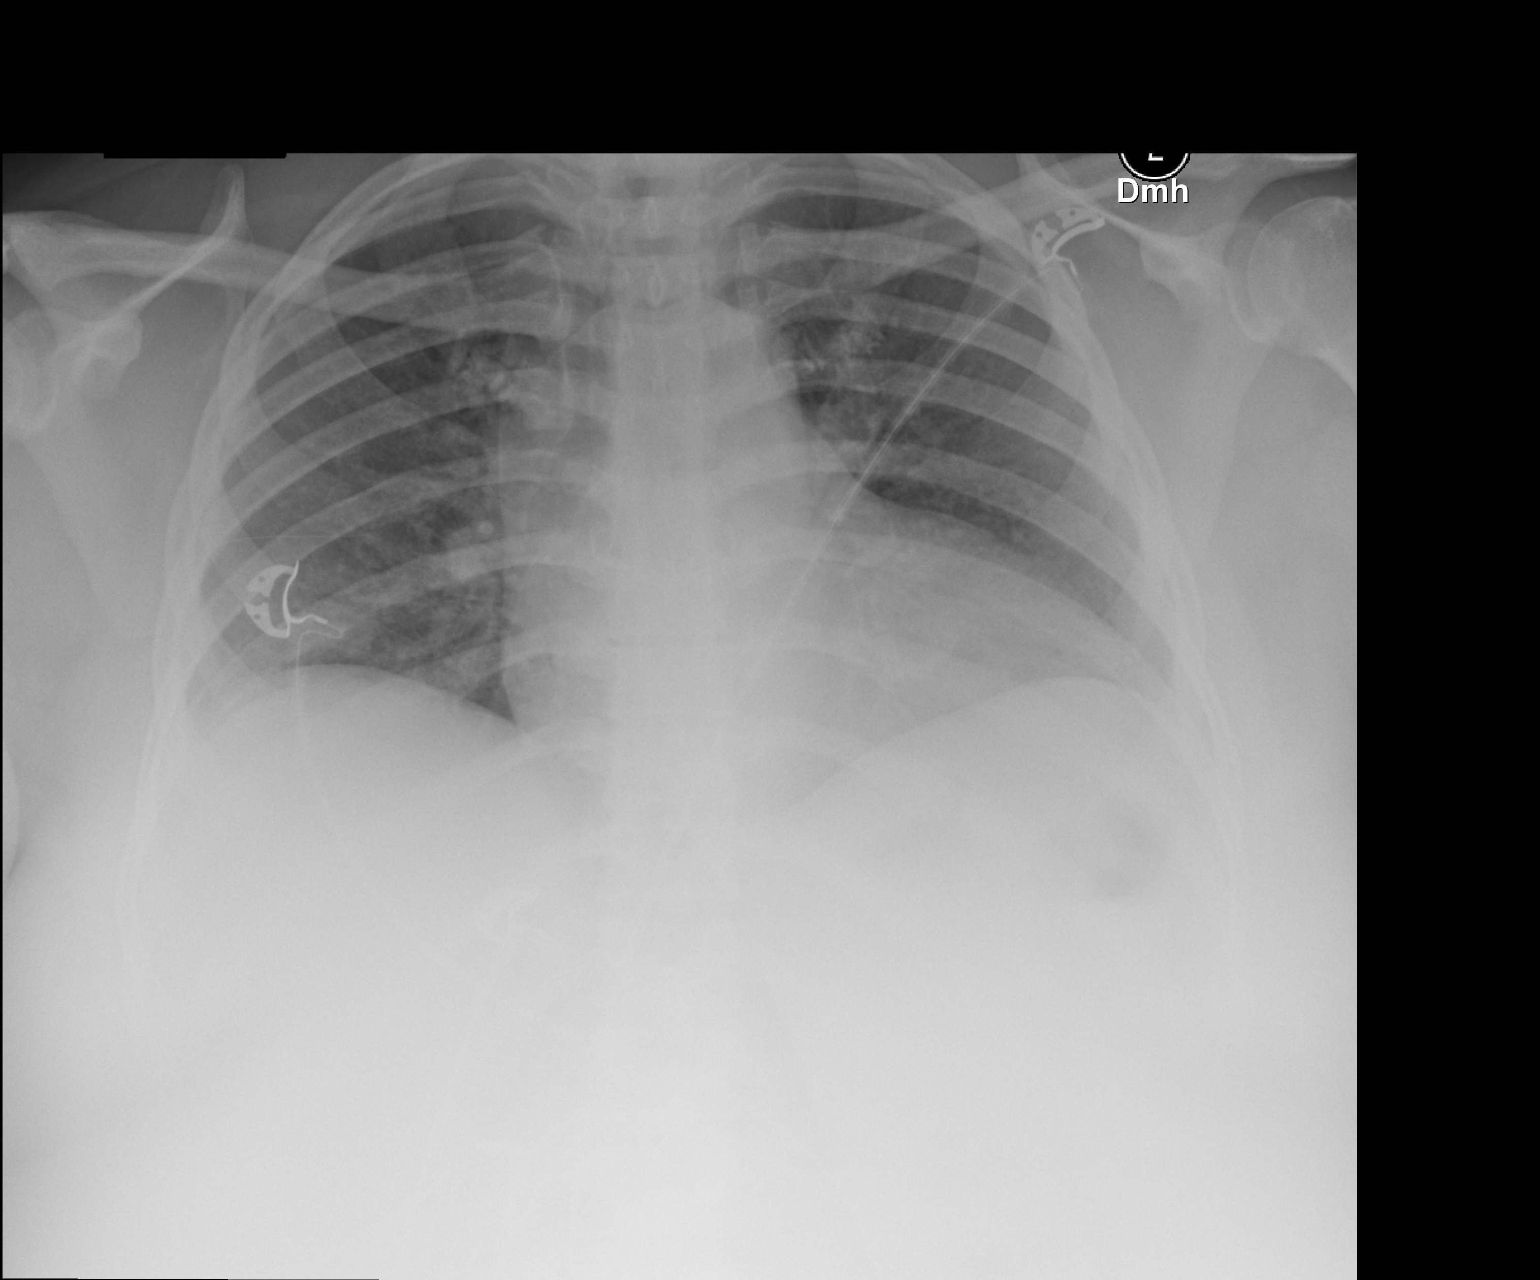

[1 of 1 positions shown; findings below may reference images not displayed]

FINDINGS: Cardiomegaly with normal pulmonary vascularity. Low lung volumes
with mild basilar atelectasis. Interim resolution of left pleural
effusion . No pneumothorax. Degenerative changes thoracic spine .
IMPRESSION: Stable cardiomegaly. Low lung volumes with mild basilar atelectasis
. Interim resolution of left pleural effusion .

## 2017-09-29 DIAGNOSIS — J386 Stenosis of larynx: Secondary | ICD-10-CM | POA: Diagnosis not present

## 2017-09-29 DIAGNOSIS — G43909 Migraine, unspecified, not intractable, without status migrainosus: Secondary | ICD-10-CM | POA: Diagnosis present

## 2017-09-29 DIAGNOSIS — J9503 Malfunction of tracheostomy stoma: Secondary | ICD-10-CM | POA: Diagnosis not present

## 2017-09-29 DIAGNOSIS — I5032 Chronic diastolic (congestive) heart failure: Secondary | ICD-10-CM | POA: Diagnosis present

## 2017-09-29 DIAGNOSIS — Z430 Encounter for attention to tracheostomy: Secondary | ICD-10-CM | POA: Diagnosis not present

## 2017-09-29 DIAGNOSIS — F329 Major depressive disorder, single episode, unspecified: Secondary | ICD-10-CM | POA: Diagnosis present

## 2017-09-29 DIAGNOSIS — E1165 Type 2 diabetes mellitus with hyperglycemia: Secondary | ICD-10-CM | POA: Diagnosis not present

## 2017-09-29 DIAGNOSIS — Z6841 Body Mass Index (BMI) 40.0 and over, adult: Secondary | ICD-10-CM | POA: Diagnosis not present

## 2017-09-29 DIAGNOSIS — G894 Chronic pain syndrome: Secondary | ICD-10-CM | POA: Diagnosis present

## 2017-09-29 DIAGNOSIS — I959 Hypotension, unspecified: Secondary | ICD-10-CM | POA: Diagnosis not present

## 2017-09-29 DIAGNOSIS — J984 Other disorders of lung: Secondary | ICD-10-CM | POA: Diagnosis not present

## 2017-09-29 DIAGNOSIS — I110 Hypertensive heart disease with heart failure: Secondary | ICD-10-CM | POA: Diagnosis present

## 2017-09-29 DIAGNOSIS — J45909 Unspecified asthma, uncomplicated: Secondary | ICD-10-CM | POA: Diagnosis present

## 2017-09-29 DIAGNOSIS — J398 Other specified diseases of upper respiratory tract: Secondary | ICD-10-CM | POA: Diagnosis not present

## 2017-09-29 DIAGNOSIS — G4733 Obstructive sleep apnea (adult) (pediatric): Secondary | ICD-10-CM | POA: Diagnosis present

## 2017-09-29 DIAGNOSIS — E1142 Type 2 diabetes mellitus with diabetic polyneuropathy: Secondary | ICD-10-CM | POA: Diagnosis present

## 2017-09-29 DIAGNOSIS — Z794 Long term (current) use of insulin: Secondary | ICD-10-CM | POA: Diagnosis not present

## 2017-09-29 DIAGNOSIS — Z930 Tracheostomy status: Secondary | ICD-10-CM | POA: Diagnosis not present

## 2017-09-29 DIAGNOSIS — K219 Gastro-esophageal reflux disease without esophagitis: Secondary | ICD-10-CM | POA: Diagnosis present

## 2017-09-29 IMAGING — DX DG CHEST 1V PORT
1 series · 1 of 1 positions shown · non-contrast
Comparison: Multiple recent exams most recently 2 days prior
02/27/2017

CLINICAL DATA: Hemoptysis.

EXAM:
PORTABLE CHEST 1 VIEW

[chest]
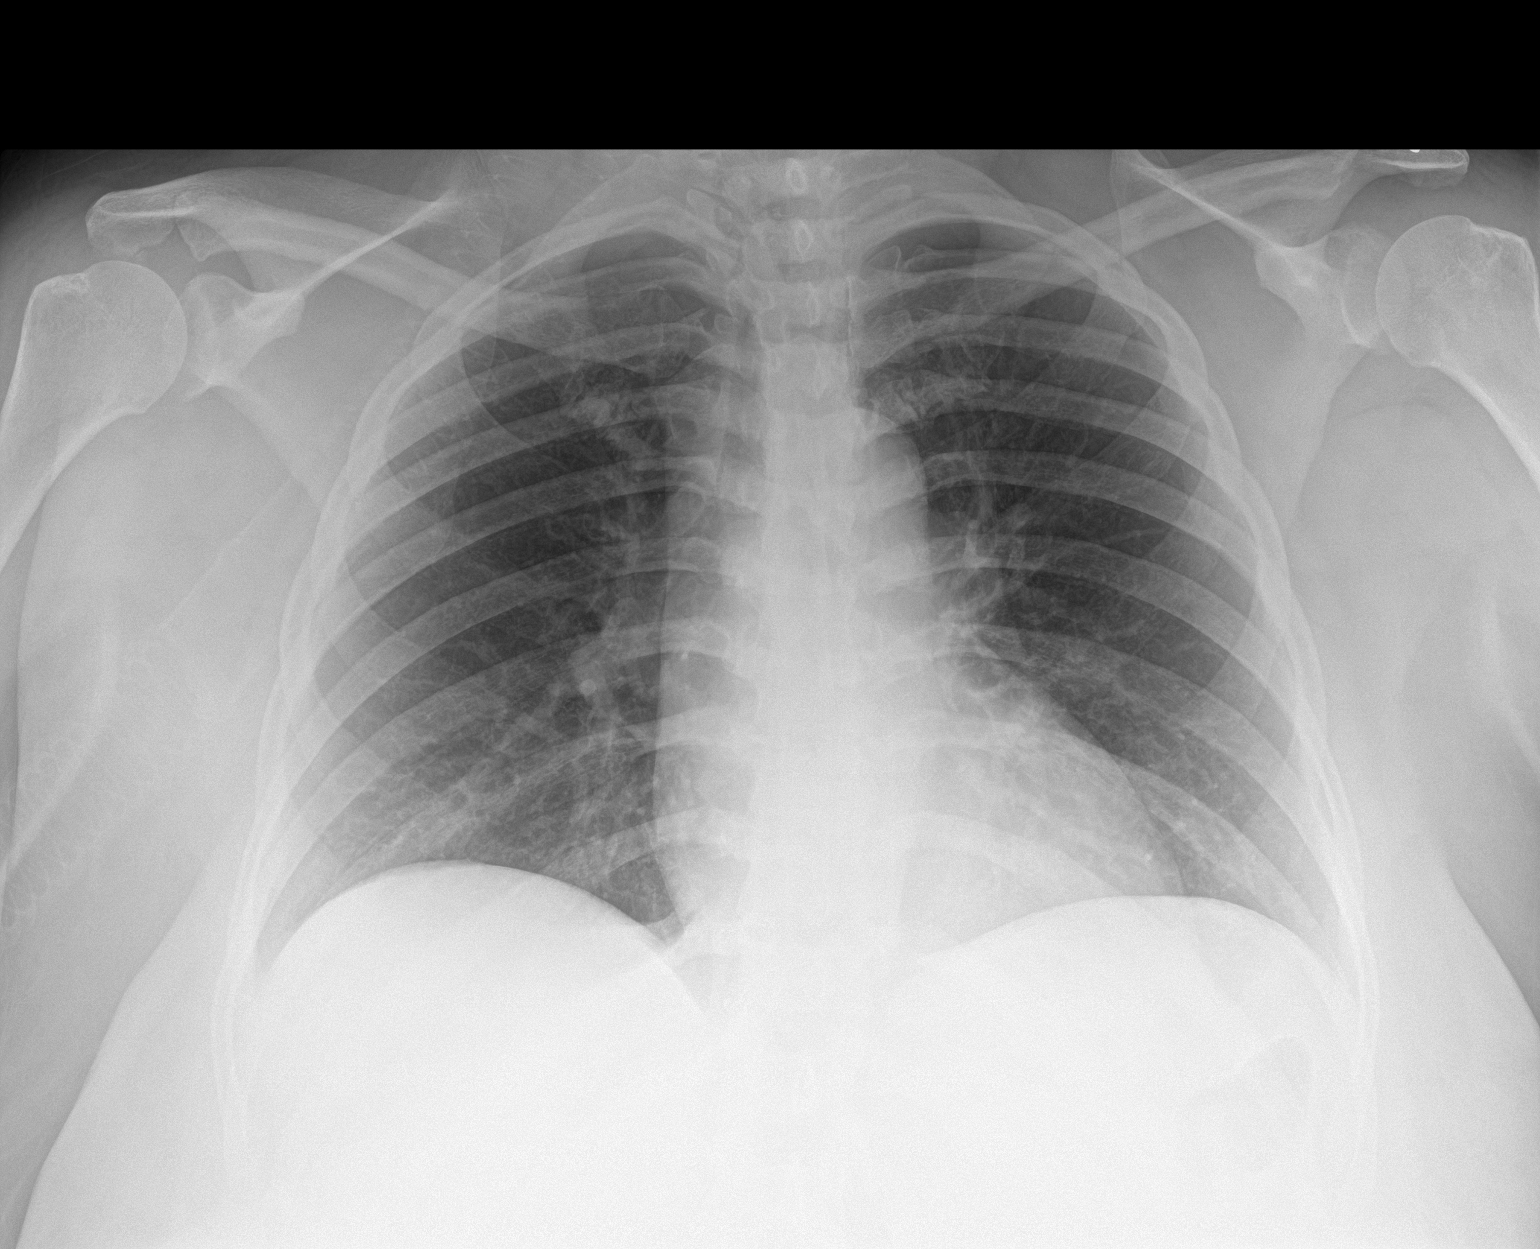

[1 of 1 positions shown; findings below may reference images not displayed]

FINDINGS: The cardiomediastinal contours are unchanged, decreased
cardiomegaly. Improved lung aeration from prior. Pulmonary
vasculature is normal. No consolidation, pleural effusion, or
pneumothorax. No acute osseous abnormalities are seen.
IMPRESSION: Improving aeration from prior exams.  No acute abnormality.

## 2017-10-02 IMAGING — DX DG CHEST 1V PORT
1 series · 1 of 1 positions shown · non-contrast
Comparison: Chest x-ray of March 01, 2017

CLINICAL DATA: Status post intubation of the trachea and of the
esophagus.

EXAM:
PORTABLE CHEST 1 VIEW

[chest]
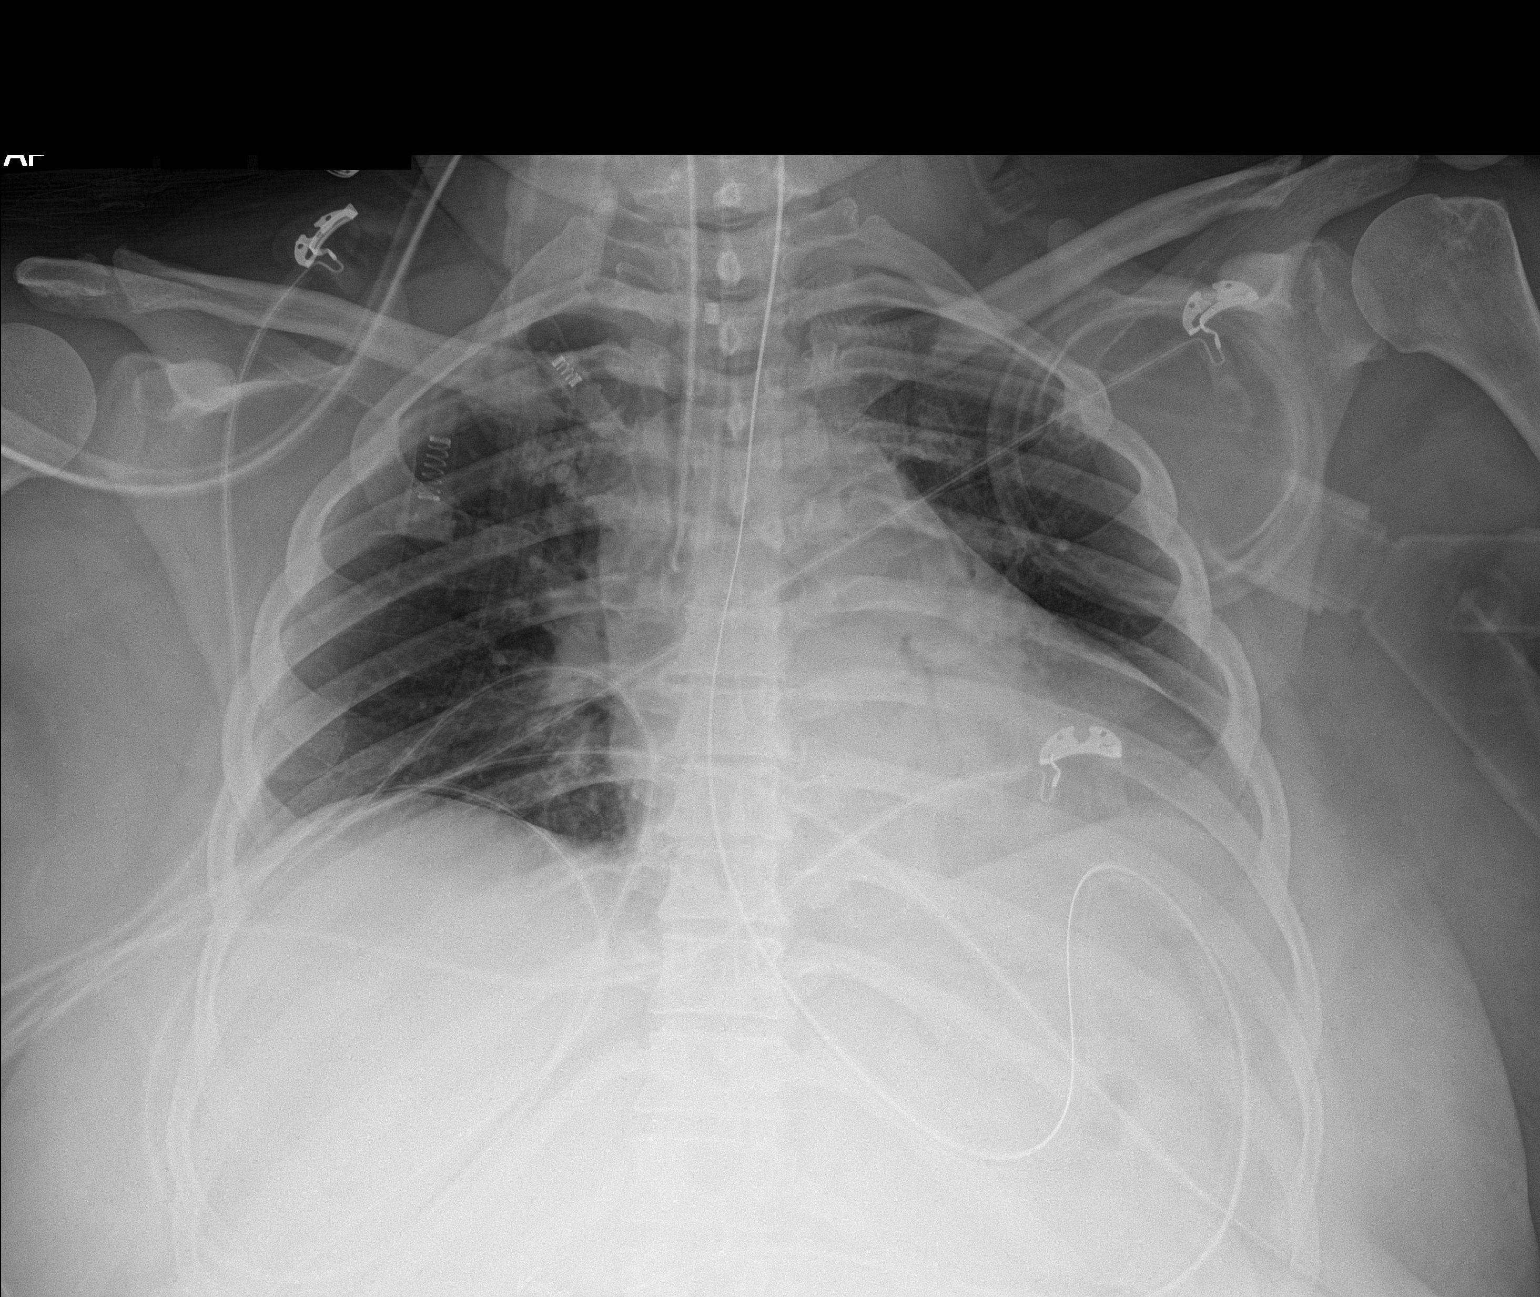

[1 of 1 positions shown; findings below may reference images not displayed]

FINDINGS: The endotracheal tube tip lies at or slightly above the carina. The
esophagogastric tube tip in proximal port project below the GE
junction and the inferior margin of the image. The lungs are
hypoinflated. There is an air bronchogram in the left lower lobe.
There is no pneumothorax nor large pleural effusion.
IMPRESSION: Low positioning of the endotracheal tube. Withdrawal by 3 cm is
recommended to avoid accidental right mainstem bronchus intubation
with patient movement.

The esophagogastric tube tip projects below the inferior margin of
the image.

Left lower lobe atelectasis or pneumonia.

## 2017-10-02 IMAGING — CT CT CHEST W/ CM
2 of 3 series · 13 of 36 positions shown, 16 images · IV contrast (Omni 300)
Comparison: Chest radiograph from earlier today. 05/03/2015 chest
CT angiogram.

CLINICAL DATA: Inpatient. Patient with severe asthma episode
approximately 2 weeks prior requiring intubation for 2 days. Patient
re-presented with hemoptysis, foreign body reduction by cough,
dyspnea and stridor. Status post direct laryngoscopy, bronchoscopy
with biopsy and esophagoscopy earlier today, which demonstrated
glottic and severe obstructive subglottic injury with apparent
granulation tissue throughout the subglottic trachea and curvilinear
foreign body possibly consistent with necrotic tracheal ring
cartilage. Adequate airway was unable to the established and the
patient was left intubated.

EXAM:
CT CHEST WITH CONTRAST
TECHNIQUE: Multidetector CT imaging of the chest was performed during
intravenous contrast administration.
CONTRAST:  100mL [id number] IOPAMIDOL (IGSUB7-6GG) INJECTION 61%,
75mL IGSUB7-6GG IOPAMIDOL (IGSUB7-6GG) INJECTION 61%

[Series 3: chest with 2mm st · axial · 0.70mm/px · z∈[-400,-210]mm · 10 of 113 slices shown, 13 images]
[im 9/113  mediastinal]
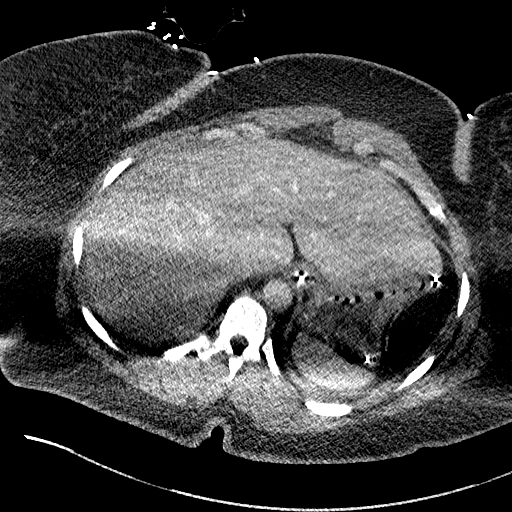
[im 9/113  lung]
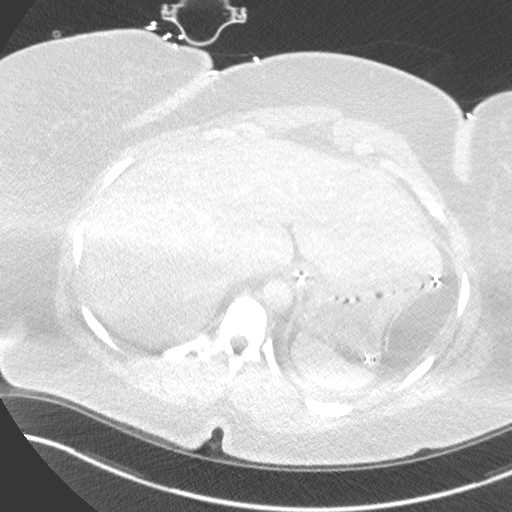
[im 17/113  lung]
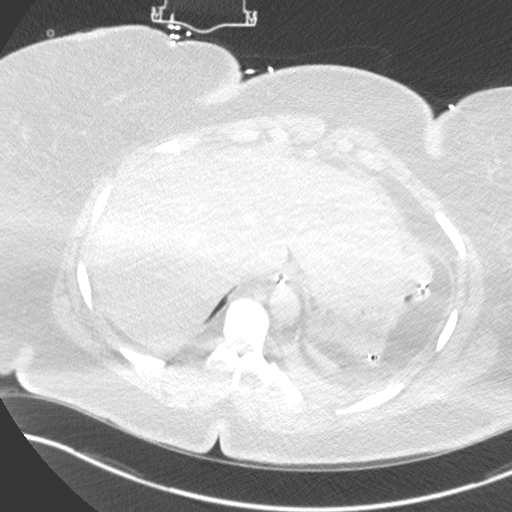
[im 30/113  lung]
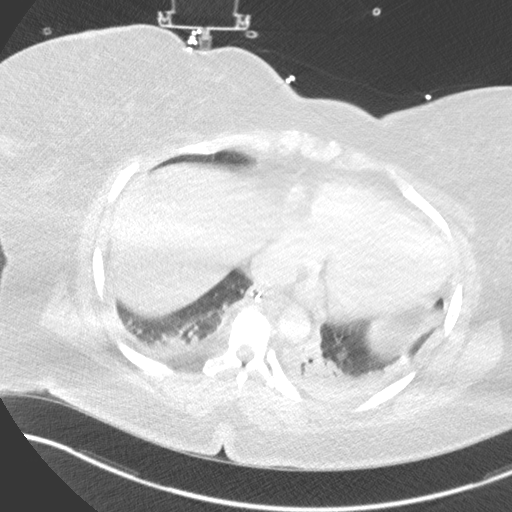
[im 42/113  lung]
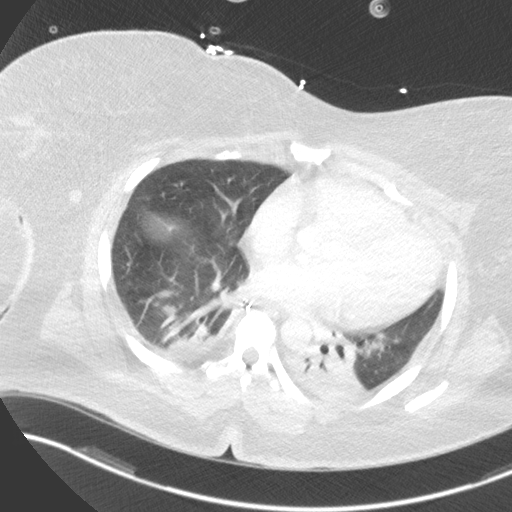
[im 50/113  mediastinal]
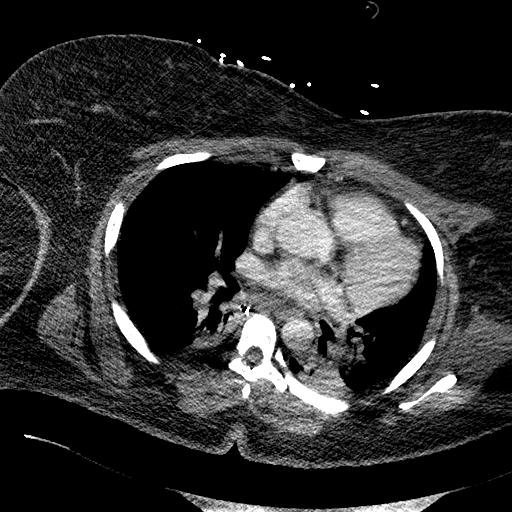
[im 50/113  lung]
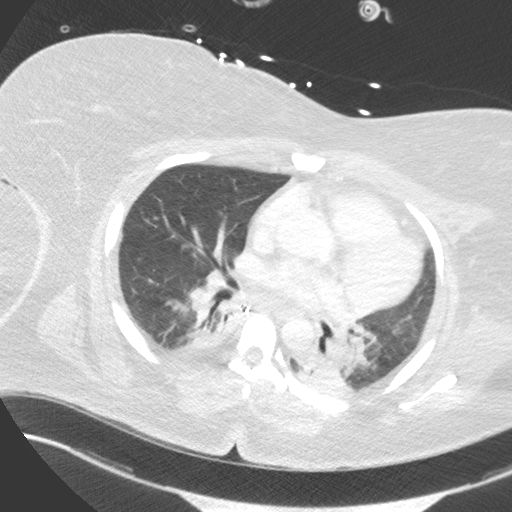
[im 63/113  lung]
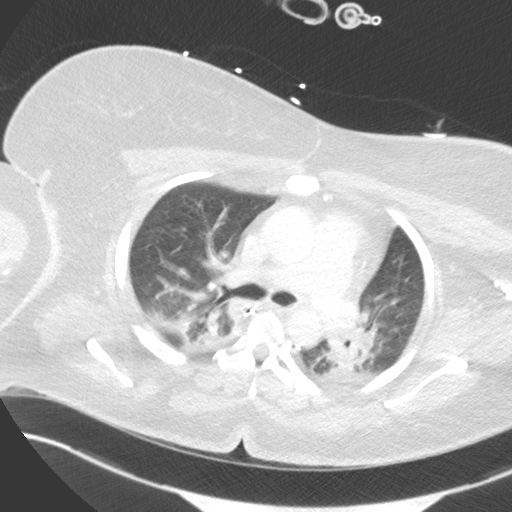
[im 71/113  lung]
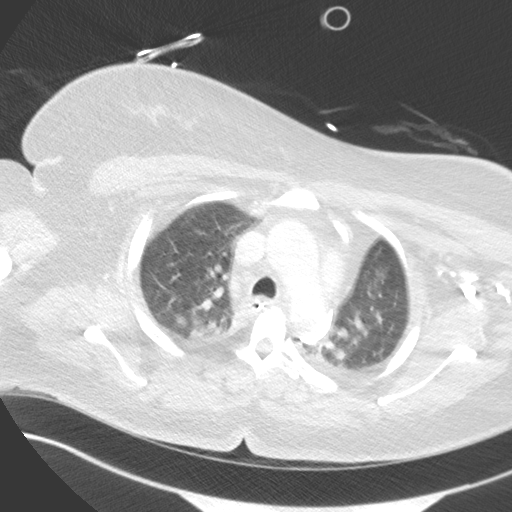
[im 83/113  lung]
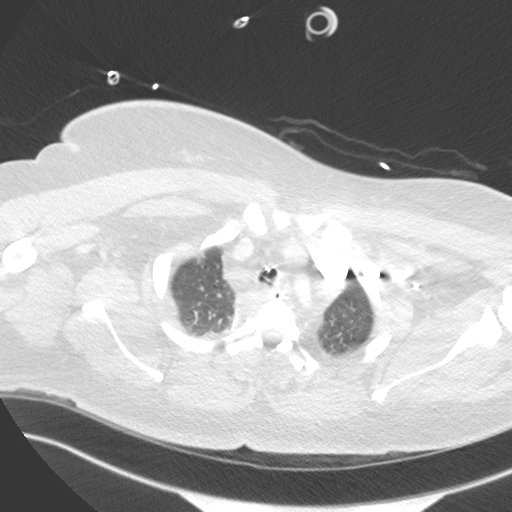
[im 96/113  mediastinal]
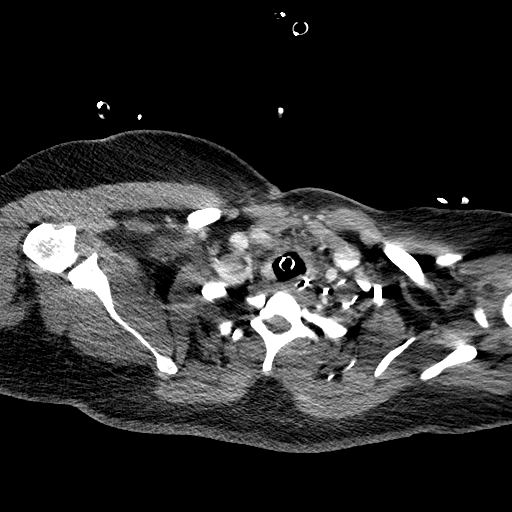
[im 96/113  lung]
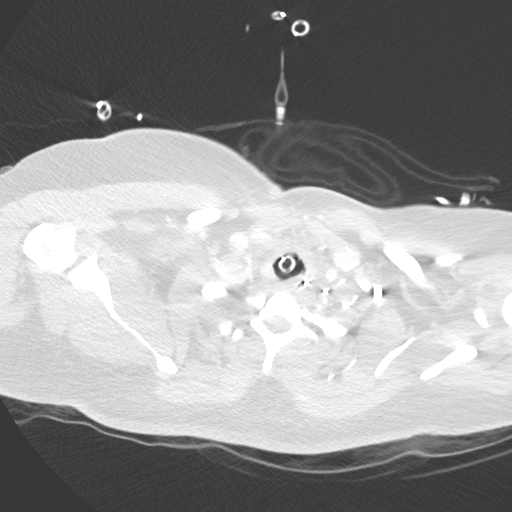
[im 104/113  lung]
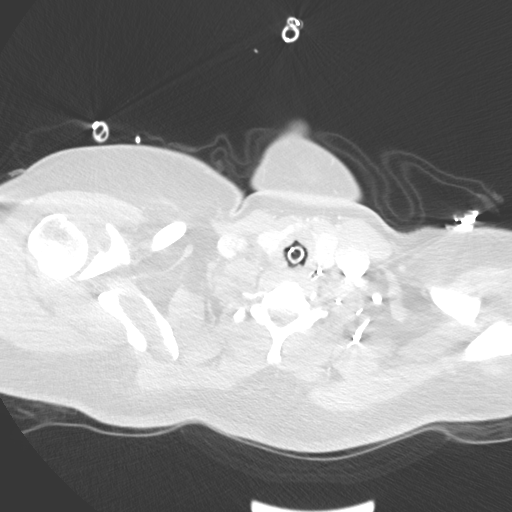

[Series 5: chest with 3mm st cor · coronal · 0.44mm/px · 3 of 84 slices shown]
[im 17/84  lung]
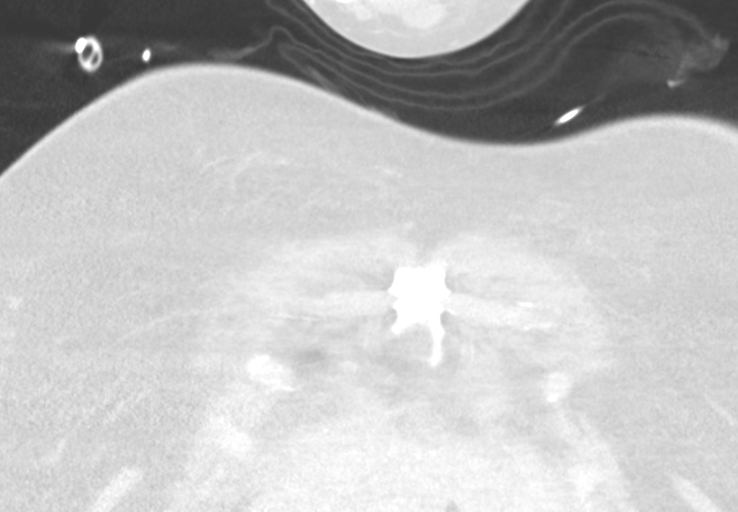
[im 34/84  lung]
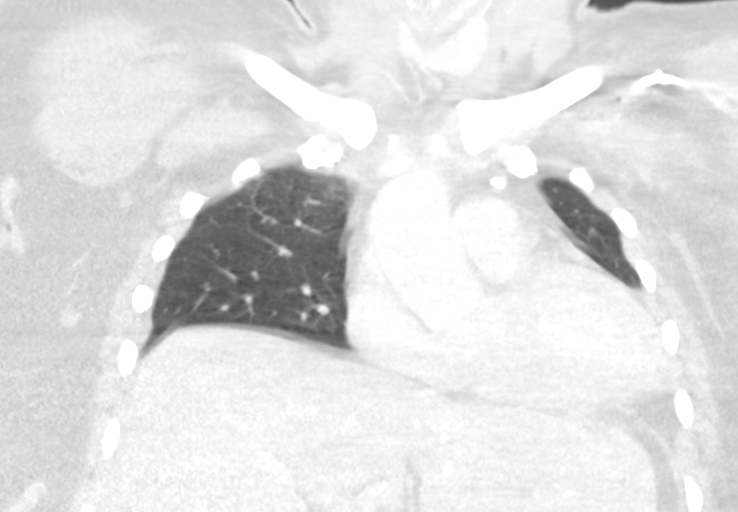
[im 50/84  lung]
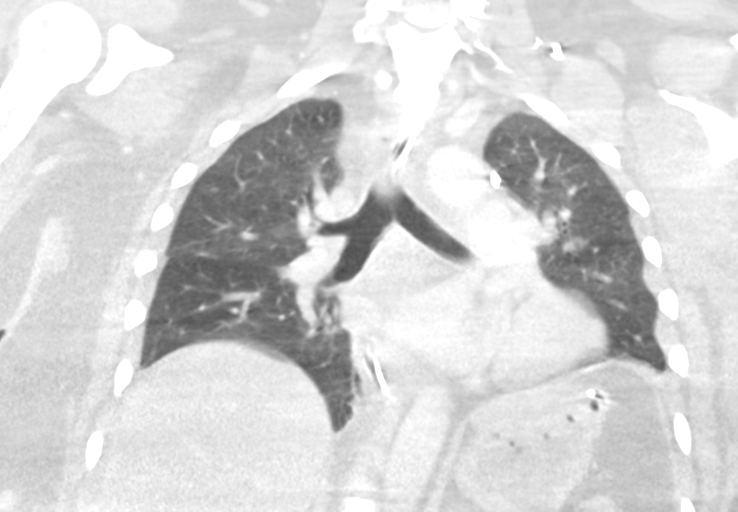

[13 of 36 positions shown; findings below may reference images not displayed]

FINDINGS: Cardiovascular: Mild cardiomegaly. No significant pericardial
fluid/thickening. Thoracic aorta is normal in course and caliber.
Brachiocephalic and left common carotid arteries share a common
origin from the aortic arch. Top-normal caliber pulmonary arteries
(main pulmonary artery diameter 3.0 cm). No central pulmonary
emboli.

Mediastinum/Nodes: No discrete thyroid nodules. Unremarkable
esophagus. Enteric tube enters the stomach, with the tip overlying
the body of the stomach on the scout topogram. Simple fluid density
2.3 x 1.9 cm right upper paratracheal structure (series 3/ image 34)
is not appreciably changed since 05/03/2015 chest CT angiogram
study. No axillary adenopathy. Mildly enlarged 1.1 cm subcarinal
node (series 3/ image 59), stable since 05/03/2015, suggesting
benign reactive adenopathy. No additional pathologically enlarged
mediastinal or hilar nodes.

Lungs/Pleura: Endotracheal tube tip is 3.3 cm above the carina.
There is irregular wall thickening and enhancement throughout the
cartilaginous portion of the subglottic trachea (series 3/image 15),
extending approximately 3-4 cm in craniocaudal extent. The tracheal
wall appears relatively normal below the level of the thoracic
inlet. The carina and mainstem bronchi walls appear normal. No
intraluminal foreign body is seen in the central airways. No
pneumothorax. No pleural effusion. Complete left lower lobe
atelectasis. Segmental medial right lower lobe atelectasis. Central
left upper lobe consolidation with associated volume loss is also
favored to represent atelectasis. Mild patchy ground-glass opacities
in the dependent right upper lobe and apical left upper lobe.

Upper abdomen: Unremarkable.

Musculoskeletal: No aggressive appearing focal osseous lesions. Mild
thoracic spondylosis.
IMPRESSION: 1. Endotracheal tube tip 3.3 cm above the carina.
2. Irregular wall thickening and enhancement throughout the
cartilaginous portion of the subglottic trachea, extending
approximately 3-4 cm in craniocaudal extent. This finding is
nonspecific and most likely represents inflammatory change and/or
tracheal wall hemorrhage given the recent episode of intubation.
3. No evidence of an intraluminal foreign body in the central
airways.
4. Enteric tube terminates in the body of the stomach .
5. Complete left lower lobe atelectasis. Segmental right lower lobe
and left upper lobe atelectasis.
6. Mild patchy bilateral upper lobe ground-glass opacities, probably
inflammatory, possibly due to aspiration pneumonitis.
7. Simple fluid density 2.3 cm round structure in the upper right
paratracheal mediastinum is stable since 05/03/2015 chest CT study,
favoring a benign etiology such as a bronchogenic cyst.
8. Mild subcarinal lymphadenopathy is stable since 1579, favoring
benign reactive adenopathy.
9. Mild cardiomegaly.

## 2017-10-02 IMAGING — CT CT NECK W/ CM
4 series · 14 of 35 positions shown, 17 images · IV contrast (iopamidol)
Comparison: Chest CT today reported separately. Neck radiographs
03/01/2017. CT head and face 09/24/2012.

CLINICAL DATA: 45-year-old female status post intubation. Shortness
of breath.

EXAM:
CT NECK WITH CONTRAST
TECHNIQUE: Multidetector CT imaging of the neck was performed using the
standard protocol following the bolus administration of intravenous
contrast.
CONTRAST:  75mL [id number] IOPAMIDOL (1IEL0A-D66) INJECTION 61% in
conjunction with contrast enhanced imaging of the chest reported
separately.

[Series 1: neck 2.0 st · axial · 0.52mm/px · z∈[-267,-125]mm · 5 of 107 slices shown, 7 images (1 of 3)]
[im 18/107  soft-tissue]
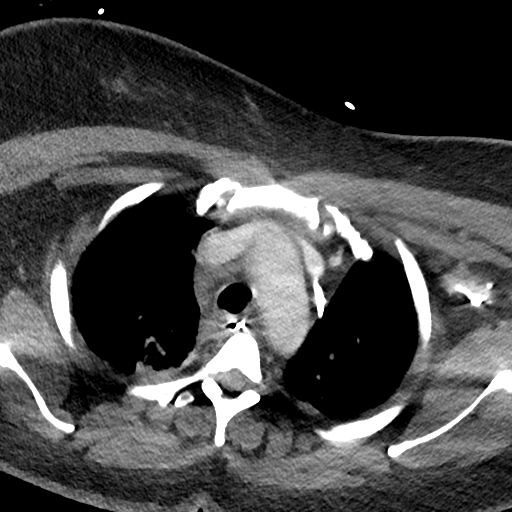
[im 18/107  bone]
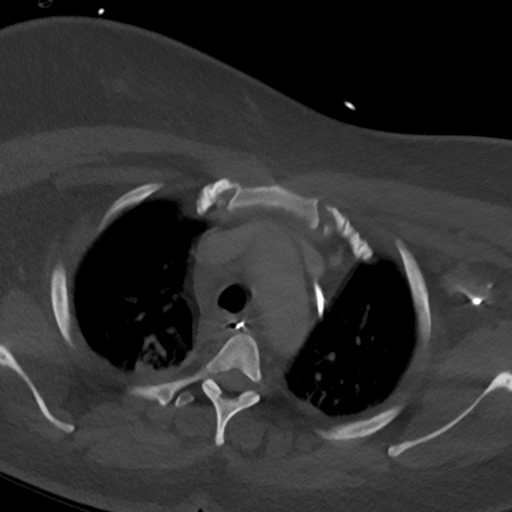
[im 36/107  bone]
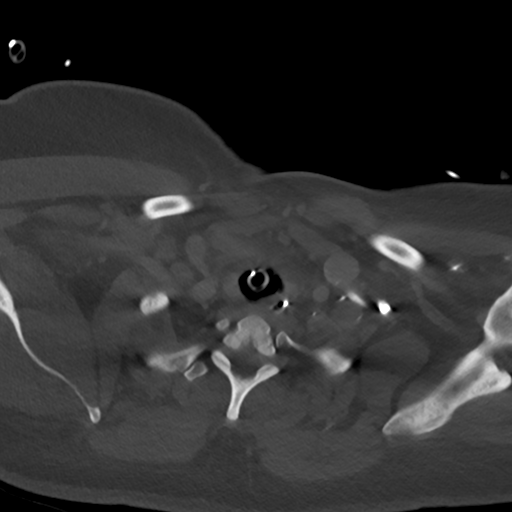
[im 54/107  bone]
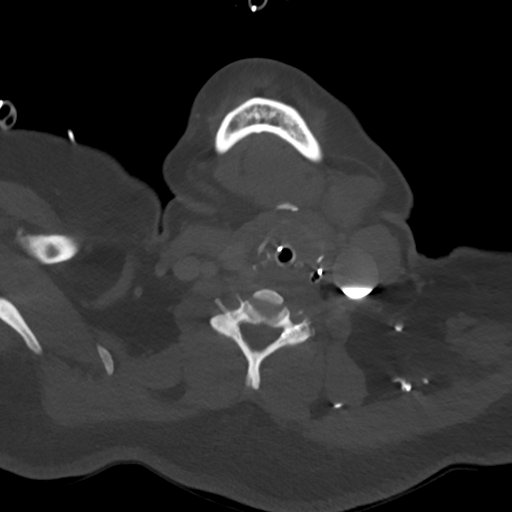
[im 71/107  bone]
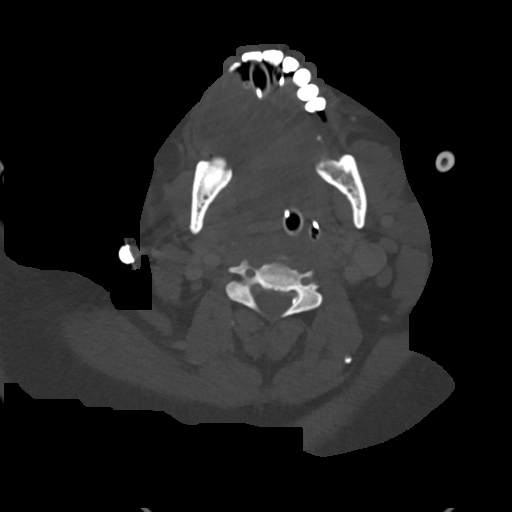
[im 89/107  soft-tissue]
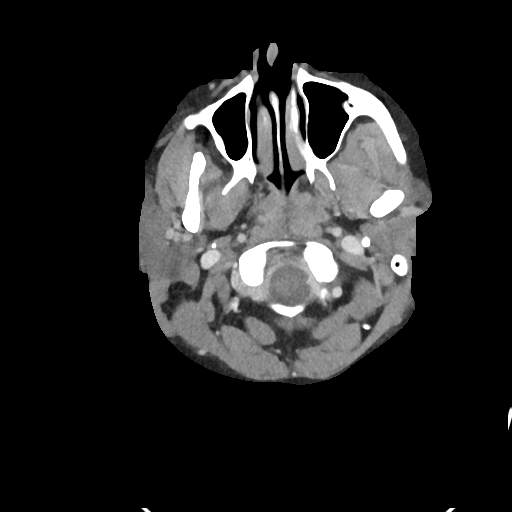
[im 89/107  bone]
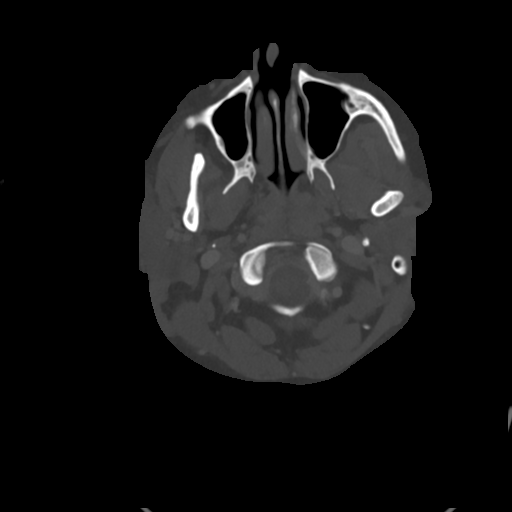

[Series 6: neck 2.0 st · sagittal · 0.42mm/px · 5 of 132 slices shown, 6 images (2 of 3)]
[im 44/132  bone]
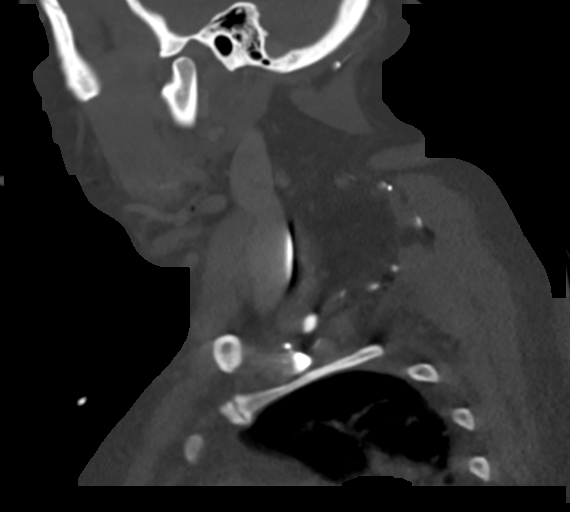
[im 55/132  bone]
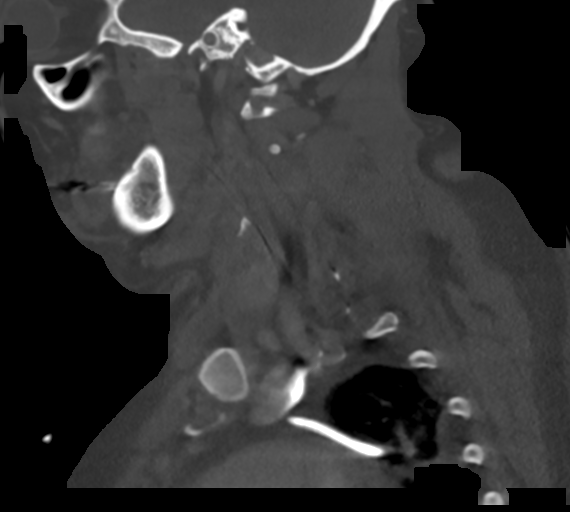
[im 66/132  soft-tissue]
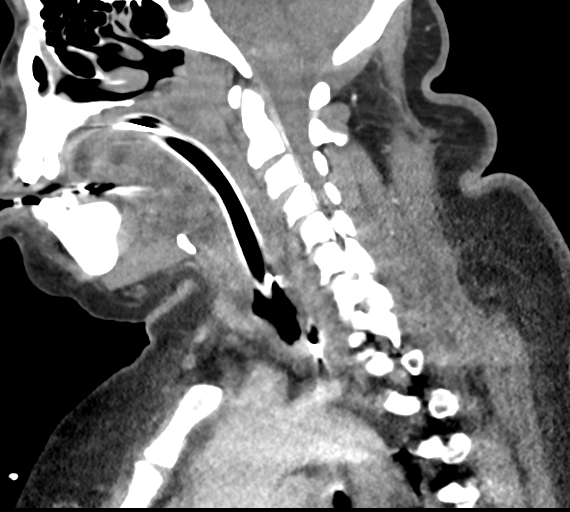
[im 66/132  bone]
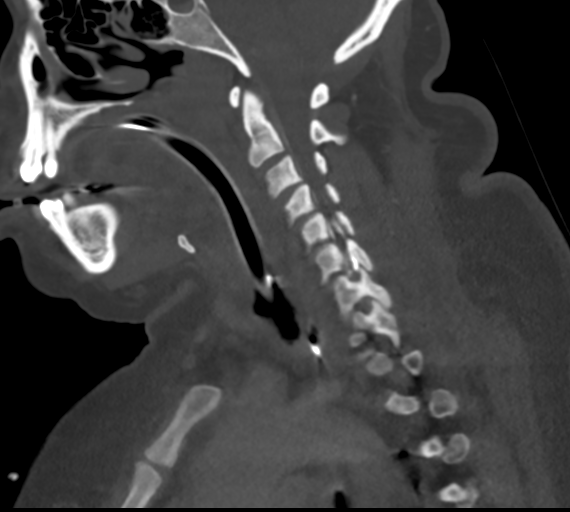
[im 77/132  bone]
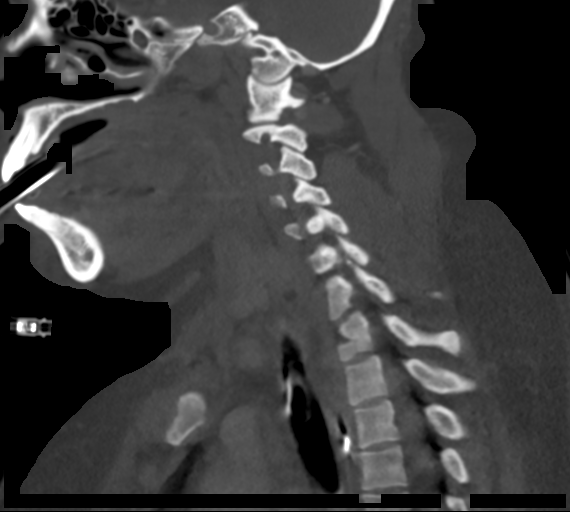
[im 88/132  bone]
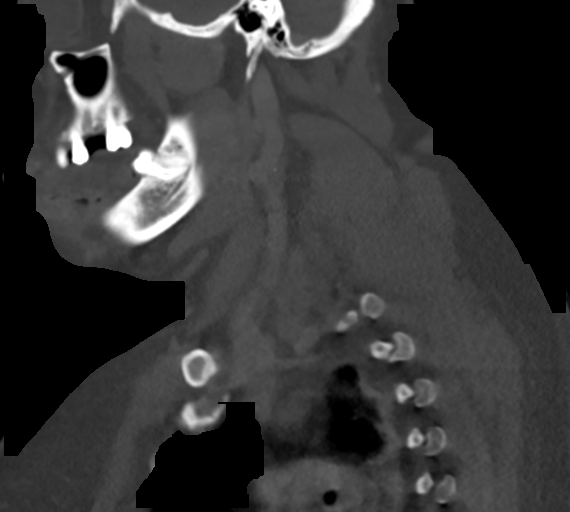

[Series 7: neck 2.0 st · coronal · 0.43mm/px · 3 of 116 slices shown (3 of 3)]
[im 25/116  bone]
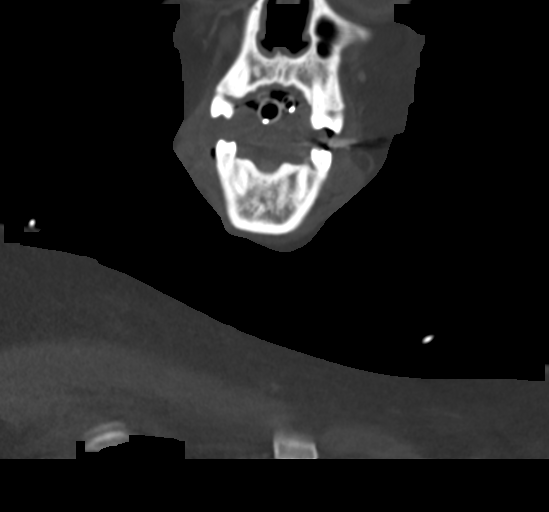
[im 47/116  bone]
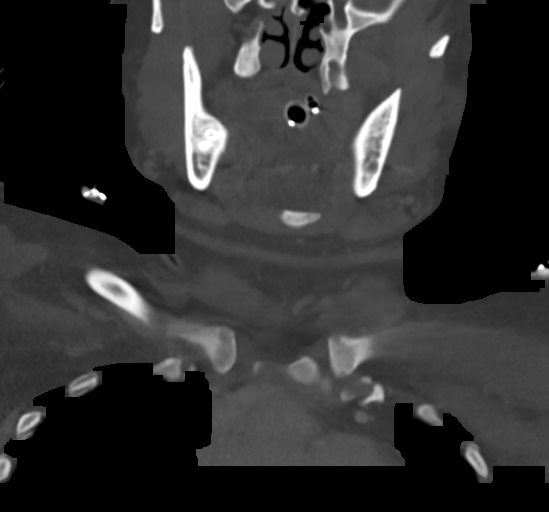
[im 69/116  bone]
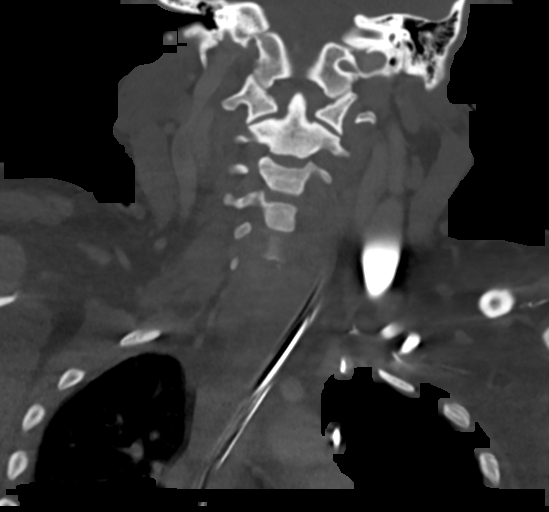

[Series 8: neck 2.0 st orthogonal · axial · 0.39mm/px · 1 of 110 slices shown]
[im 19/110  bone]
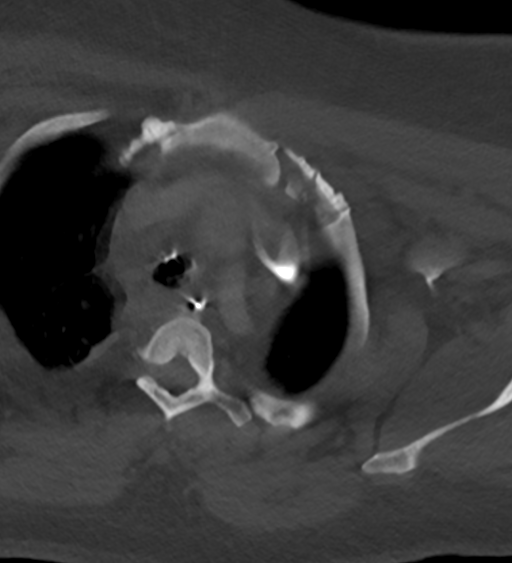

[14 of 35 positions shown; findings below may reference images not displayed]

FINDINGS: Pharynx and larynx: Endotracheal tube and oral enteric tubes are in
place. The endotracheal tube tip terminates 2.5 cm above the carina.
The enteric tube continues into the thoracic esophagus.

Subsequently limited detail of the larynx and oropharynx. The
nasopharynx appears normal aside from a small volume of layering
secretions. Parapharyngeal and retropharyngeal spaces appear within
normal limits.

Salivary glands: Sublingual space, submandibular glands and parotid
glands appear normal.

Thyroid: Negative.

Lymph nodes: No cervical lymphadenopathy. Bilateral cervical lymph
nodes are within normal limits.

No subcutaneous gas. No focal soft tissue inflammation identified in
the neck.

Vascular: Reflux of venous contrast into the left IJ appears related
to functional stenosis of the left innominate vein. Major vascular
structures in the neck and at the skullbase appear patent. Left
vertebral artery appears dominant.

Limited intracranial: Negative.

Visualized orbits: Negative.

Mastoids and visualized paranasal sinuses: Mild bubbly opacity in
the left sphenoid sinus. Mild right sphenoid and posterior ethmoid
mucosal thickening. Other visible paranasal sinuses and mastoids are
stable and well pneumatized.

Skeleton: No acute osseous abnormality identified.

Upper chest: Reported separately today.
IMPRESSION: 1. Intubated with appropriate positioning of visible endotracheal
tube and oral enteric tube.
2. Subsequent limited detail of the pharynx and larynx. The deep
soft tissue spaces of the neck appear normal.
3. Mild retained secretions in the nasopharynx and ethmoid/sphenoid
inflammation probably related to #1.
4. See also Chest CT today reported separately.

## 2017-10-03 IMAGING — CR DG CHEST 1V PORT
1 series · 1 of 1 positions shown · non-contrast
Comparison: 03/04/2017 chest radiograph and CT

CLINICAL DATA: Respiratory failure.  Shortness of breath.

EXAM:
PORTABLE CHEST 1 VIEW

[AP]
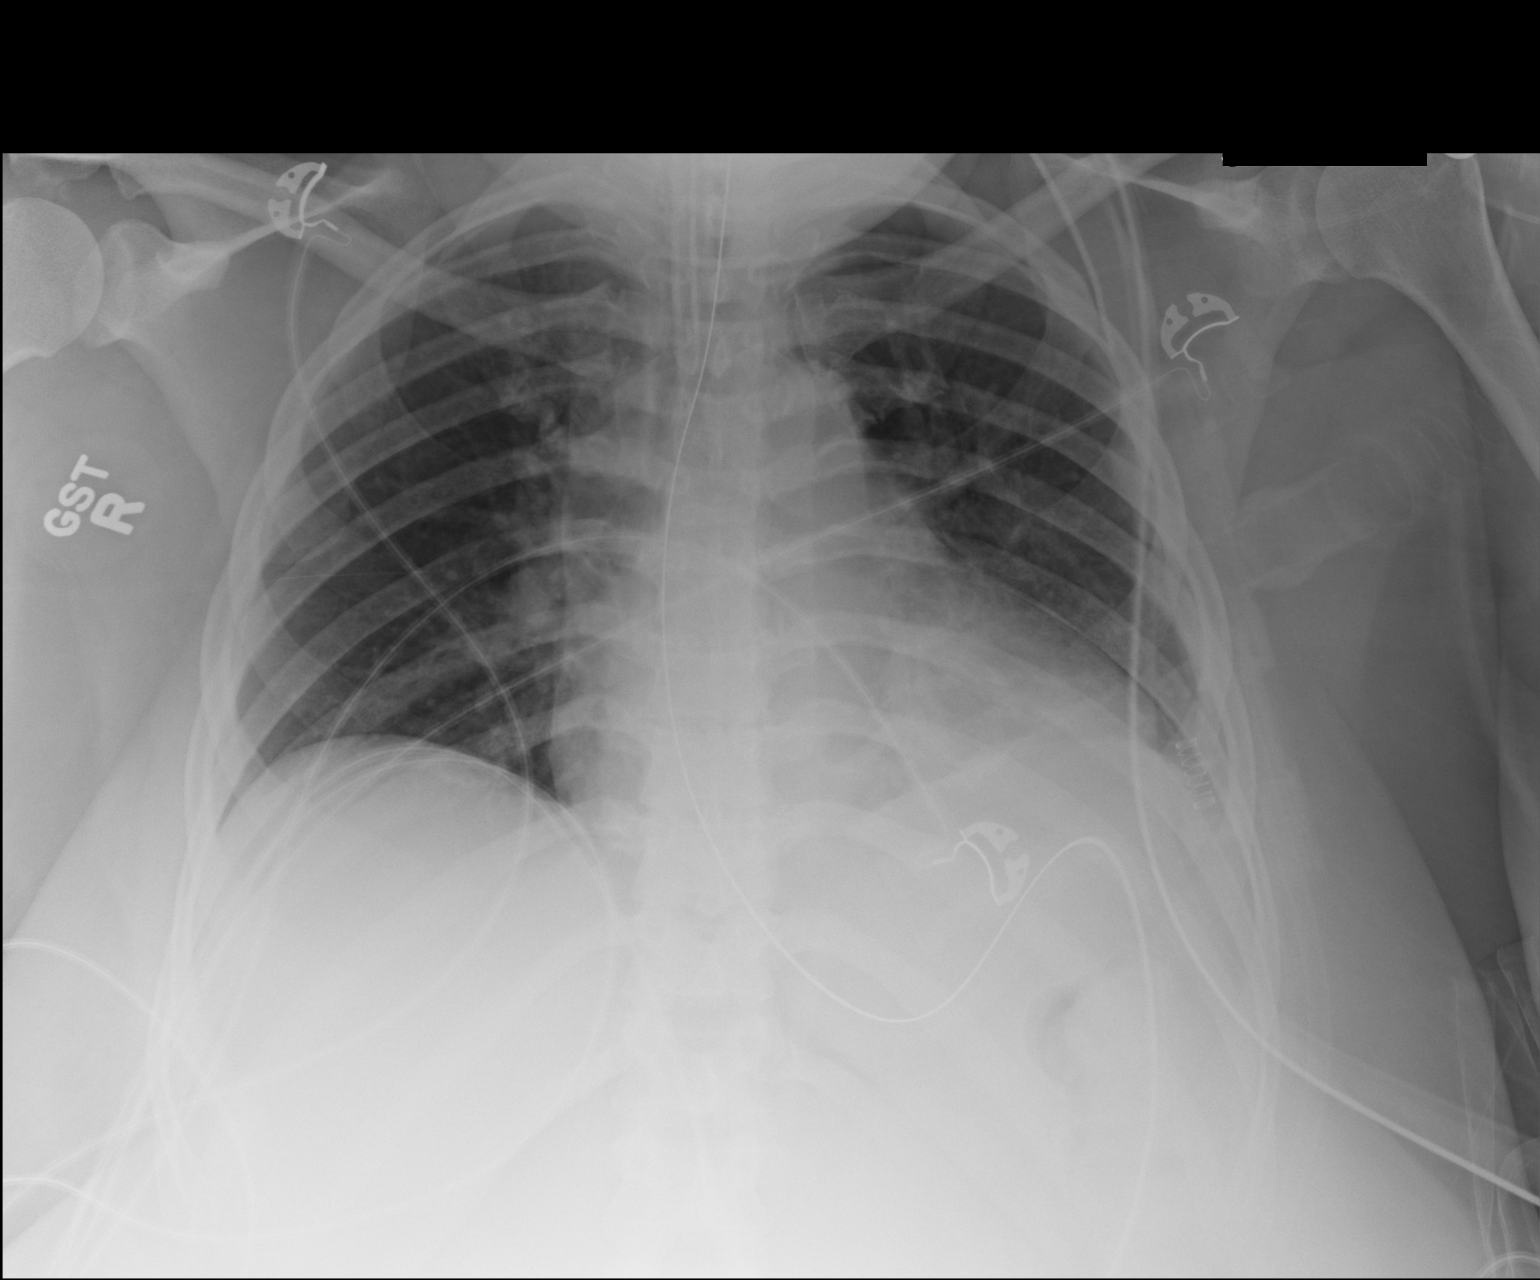

[1 of 1 positions shown; findings below may reference images not displayed]

FINDINGS: The endotracheal tube terminates 3.2 cm above the carina. Enteric
tube courses into the stomach with tip not imaged. The cardiac
silhouette remains mildly enlarged. Left lower lobe atelectasis has
improved. The right lung is grossly clear. No sizable pleural
effusion or pneumothorax is identified.
IMPRESSION: 1. Support devices as above.
2. Improved left lower lobe aeration.

## 2017-10-04 IMAGING — DX DG CHEST 1V PORT
1 series · 1 of 1 positions shown · non-contrast
Comparison: Portable chest x-ray September 04, 2017

CLINICAL DATA: Acute respiratory failure, history of asthma,
tracheal stenosis, diabetes, current smoker.

EXAM:
PORTABLE CHEST 1 VIEW

[chest ap]
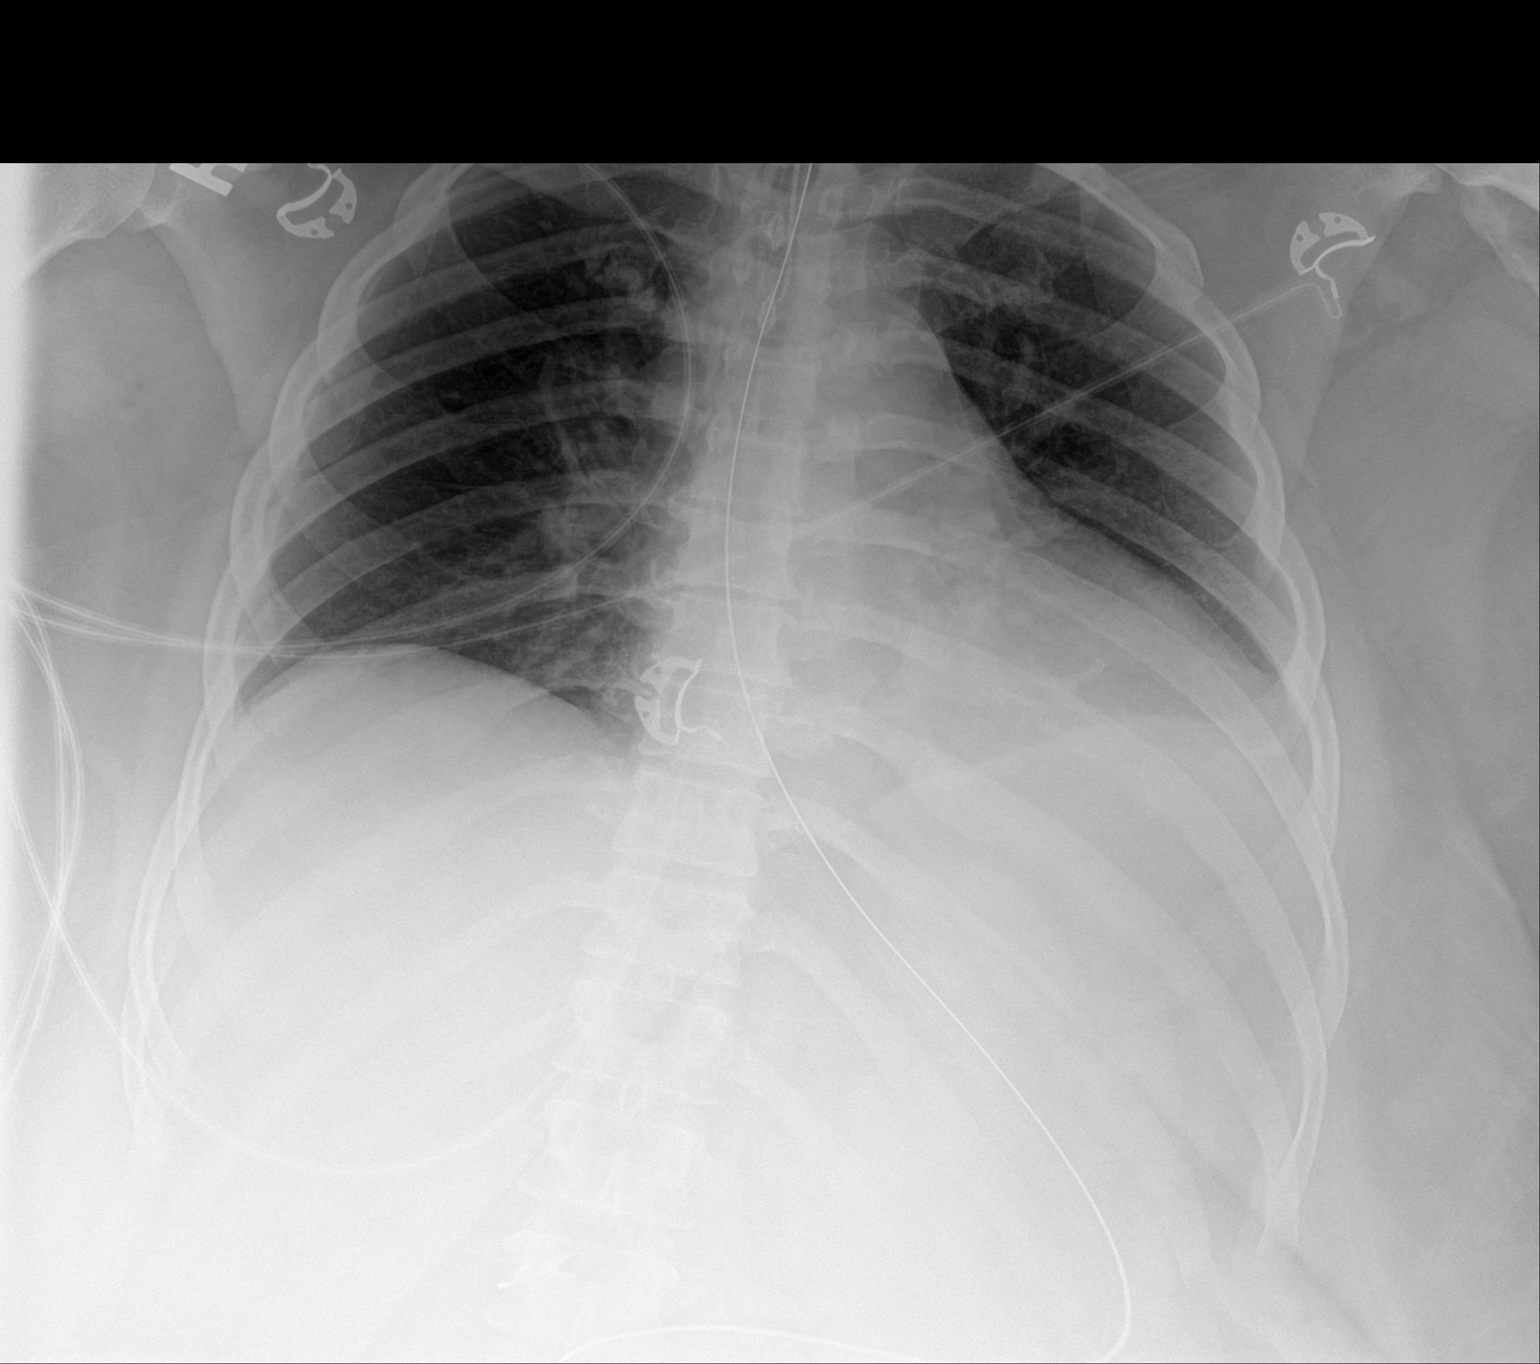

[1 of 1 positions shown; findings below may reference images not displayed]

FINDINGS: The lungs are reasonably well inflated. There is persistent
atelectasis or infiltrate in the left lower lobe. A small amount of
pleural fluid on the left may be present. The cardiac silhouette is
enlarged but stable. The pulmonary vascularity is not engorged. The
endotracheal tube tip lies 3.7 cm above the carina. The
esophagogastric tube tip may lie in the proximal duodenum.
IMPRESSION: Fairly stable appearance of the chest. Persistent left lower lobe
atelectasis or pneumonia. Stable cardiomegaly without significant
pulmonary vascular congestion. The support tubes are in stable
position.

## 2017-10-15 ENCOUNTER — Other Ambulatory Visit: Payer: Self-pay | Admitting: Pharmacist

## 2017-10-15 ENCOUNTER — Ambulatory Visit: Payer: Medicare Other | Attending: Family Medicine | Admitting: Family Medicine

## 2017-10-15 ENCOUNTER — Encounter: Payer: Self-pay | Admitting: Family Medicine

## 2017-10-15 ENCOUNTER — Telehealth: Payer: Self-pay | Admitting: Pharmacist

## 2017-10-15 VITALS — BP 138/90 | HR 106 | Temp 98.9°F | Ht 61.0 in | Wt 230.4 lb

## 2017-10-15 DIAGNOSIS — E1165 Type 2 diabetes mellitus with hyperglycemia: Secondary | ICD-10-CM | POA: Insufficient documentation

## 2017-10-15 DIAGNOSIS — G4733 Obstructive sleep apnea (adult) (pediatric): Secondary | ICD-10-CM | POA: Insufficient documentation

## 2017-10-15 DIAGNOSIS — J386 Stenosis of larynx: Secondary | ICD-10-CM | POA: Insufficient documentation

## 2017-10-15 DIAGNOSIS — R079 Chest pain, unspecified: Secondary | ICD-10-CM | POA: Diagnosis not present

## 2017-10-15 DIAGNOSIS — R05 Cough: Secondary | ICD-10-CM

## 2017-10-15 DIAGNOSIS — G8929 Other chronic pain: Secondary | ICD-10-CM | POA: Insufficient documentation

## 2017-10-15 DIAGNOSIS — E1142 Type 2 diabetes mellitus with diabetic polyneuropathy: Secondary | ICD-10-CM | POA: Insufficient documentation

## 2017-10-15 DIAGNOSIS — Z9049 Acquired absence of other specified parts of digestive tract: Secondary | ICD-10-CM | POA: Diagnosis not present

## 2017-10-15 DIAGNOSIS — Z794 Long term (current) use of insulin: Secondary | ICD-10-CM | POA: Diagnosis not present

## 2017-10-15 DIAGNOSIS — J398 Other specified diseases of upper respiratory tract: Secondary | ICD-10-CM | POA: Diagnosis not present

## 2017-10-15 DIAGNOSIS — R062 Wheezing: Secondary | ICD-10-CM | POA: Insufficient documentation

## 2017-10-15 DIAGNOSIS — Z930 Tracheostomy status: Secondary | ICD-10-CM | POA: Diagnosis not present

## 2017-10-15 DIAGNOSIS — J45901 Unspecified asthma with (acute) exacerbation: Secondary | ICD-10-CM | POA: Insufficient documentation

## 2017-10-15 DIAGNOSIS — R059 Cough, unspecified: Secondary | ICD-10-CM

## 2017-10-15 DIAGNOSIS — F329 Major depressive disorder, single episode, unspecified: Secondary | ICD-10-CM | POA: Insufficient documentation

## 2017-10-15 DIAGNOSIS — K219 Gastro-esophageal reflux disease without esophagitis: Secondary | ICD-10-CM | POA: Insufficient documentation

## 2017-10-15 DIAGNOSIS — Z888 Allergy status to other drugs, medicaments and biological substances status: Secondary | ICD-10-CM | POA: Diagnosis not present

## 2017-10-15 DIAGNOSIS — E876 Hypokalemia: Secondary | ICD-10-CM | POA: Diagnosis not present

## 2017-10-15 DIAGNOSIS — M549 Dorsalgia, unspecified: Secondary | ICD-10-CM | POA: Diagnosis not present

## 2017-10-15 DIAGNOSIS — Z79899 Other long term (current) drug therapy: Secondary | ICD-10-CM | POA: Diagnosis not present

## 2017-10-15 DIAGNOSIS — I10 Essential (primary) hypertension: Secondary | ICD-10-CM

## 2017-10-15 LAB — GLUCOSE, POCT (MANUAL RESULT ENTRY): POC GLUCOSE: 381 mg/dL — AB (ref 70–99)

## 2017-10-15 LAB — POCT GLYCOSYLATED HEMOGLOBIN (HGB A1C): HEMOGLOBIN A1C: 13.3

## 2017-10-15 MED ORDER — ALBUTEROL SULFATE HFA 108 (90 BASE) MCG/ACT IN AERS: 2.0000 | INHALATION_SPRAY | Freq: Four times a day (QID) | RESPIRATORY_TRACT | 2 refills | Status: DC | PRN

## 2017-10-15 MED ORDER — HYDROCODONE-HOMATROPINE 5-1.5 MG/5ML PO SYRP: 5.0000 mL | ORAL_SOLUTION | Freq: Four times a day (QID) | ORAL | 0 refills | Status: DC | PRN

## 2017-10-15 MED ORDER — INSULIN PEN NEEDLE 31G X 5 MM MISC: 1.0000 | Freq: Every day | 5 refills | Status: DC

## 2017-10-15 MED ORDER — BASAGLAR KWIKPEN 100 UNIT/ML ~~LOC~~ SOPN: 50.0000 [IU] | PEN_INJECTOR | Freq: Every day | SUBCUTANEOUS | 1 refills | Status: DC

## 2017-10-15 MED ORDER — INSULIN GLARGINE 100 UNIT/ML SOLOSTAR PEN: 50.0000 [IU] | PEN_INJECTOR | Freq: Every day | SUBCUTANEOUS | 1 refills | Status: DC

## 2017-10-15 MED ORDER — MONTELUKAST SODIUM 10 MG PO TABS: 10.0000 mg | ORAL_TABLET | Freq: Every day | ORAL | 3 refills | Status: DC

## 2017-10-15 MED ORDER — BENZONATATE 100 MG PO CAPS: 100.0000 mg | ORAL_CAPSULE | Freq: Three times a day (TID) | ORAL | 0 refills | Status: DC

## 2017-10-15 MED ORDER — FLUTICASONE-SALMETEROL 230-21 MCG/ACT IN AERO: 2.0000 | INHALATION_SPRAY | Freq: Two times a day (BID) | RESPIRATORY_TRACT | 3 refills | Status: DC

## 2017-10-15 NOTE — Telephone Encounter (Signed)
Advair HFA on national backorder. I see she has been on the diskus in the past so will forward to PCP for review. If she needs HFA, needs to switch to alternative brand.

## 2017-10-15 NOTE — Progress Notes (Signed)
Pt would like tessalon pearls and cough syrup.

## 2017-10-16 MED ORDER — BUDESONIDE-FORMOTEROL FUMARATE 160-4.5 MCG/ACT IN AERO: 2.0000 | INHALATION_SPRAY | Freq: Two times a day (BID) | RESPIRATORY_TRACT | 3 refills | Status: DC

## 2017-10-16 NOTE — Telephone Encounter (Signed)
I have sent in a rx for Symbicort to her pharmacy. Do you mind letting her know as she will be expecting to pick up Advair HFA? Thanks.

## 2017-10-16 NOTE — Addendum Note (Signed)
Addended by: [name] on: 10/16/2017 09:29 AM   Modules accepted: Orders

## 2017-10-16 NOTE — Progress Notes (Signed)
Subjective:  Patient ID: [name], female    DOB: March 30, 1971  Age: 47 y.o. MRN: [id number]  CC: Diabetes   [name] is a 47 year old female with a history of type 2 diabetes mellitus (A1c 13.3), hypertension, obstructive sleep apnea, asthma, depression, s/p tracheostomy secondary to intubation related tracheal injury, subglottic stenosis (status post balloon dilatation and Kenalog injection of stenosis tracheostomy tube exchange by ENT at [facility]) who presents today for follow-up visit accompanied by her husband after recently undergoing laryngotracheal reconstruction at [facility] where she was hospitalized from 09/29/17 - 10/02/17.  During her hospital course she was hyperglycemic and was placed on Lantus 50 units daily, Humalog 15 units tid and metformin.  Today she complains of cough and production of a lot of secretion which has to be suctioned frequently with production of blood. She has been using her MDI but not advair as she is unable to use it due to her tracheostomy tube. Her up coming appointment with ENT is on 10/19/17  Past Medical History:  Diagnosis Date  . Arthritis   . Asthma   . Chronic back pain   . Chronic chest pain   . Depression   . DM (diabetes mellitus) ([place])    INSULIN DEPENDENT  . GERD (gastroesophageal reflux disease)   . Headache(784.0)   . Hypertension   . Hypokalemia   . Respiratory disease 05/2017  . Tracheostomy in place [facility]) 02/2017    Past Surgical History:  Procedure Laterality Date  . APPENDECTOMY    . CESAREAN SECTION     x 3  . CHOLECYSTECTOMY N/A 03/02/2014   Procedure: LAPAROSCOPIC CHOLECYSTECTOMY;  Surgeon: Joyice Faster. Cornett, MD;  Location: [place];  Service: General;  Laterality: N/A;  . HERNIA REPAIR    . PANENDOSCOPY N/A 03/04/2017   Procedure: PANENDOSCOPY WITH POSSIBLE FOREIGN BODY REMOVAL;  Surgeon: Jodi Marble, MD;  Location: [place];  Service: ENT;  Laterality: N/A;  .  ROTATOR CUFF REPAIR    . TRACHEOSTOMY  02/2017  . VESICOVAGINAL FISTULA CLOSURE W/ TAH  2009    Allergies  Allergen Reactions  . Reglan [Metoclopramide] Other (See Comments)    Panic attack     Outpatient Medications Prior to Visit  Medication Sig Dispense Refill  . ACCU-CHEK SOFTCLIX LANCETS lancets Use as instructed to check blood sugar once daily. E11.9 100 each 12  . albuterol (PROVENTIL) (2.5 MG/3ML) 0.083% nebulizer solution Take 3 mLs (2.5 mg total) by nebulization every 6 (six) hours as needed for wheezing or shortness of breath. Reported on 02/22/2016 75 mL 3  . amLODipine (NORVASC) 10 MG tablet Take 1 tablet (10 mg total) by mouth daily. 90 tablet 0  . Artificial Tear Ointment (ARTIFICIAL TEARS) ointment Place 1 application into both eyes at bedtime.     Marland Kitchen atorvastatin (LIPITOR) 20 MG tablet Take 1 tablet (20 mg total) by mouth daily. 90 tablet 0  . Blood Glucose Monitoring Suppl (ACCU-CHEK AVIVA PLUS) w/Device KIT Used to check blood sugars once daily. E11.9 1 kit 0  . carvedilol (COREG) 12.5 MG tablet Take 1 tablet (12.5 mg total) by mouth 2 (two) times daily with a meal. 60 tablet 3  . gabapentin (NEURONTIN) 300 MG capsule Take 1 capsule (300 mg total) by mouth 3 (three) times daily. 90 capsule 5  . metFORMIN (GLUCOPHAGE-XR) 500 MG 24 hr tablet Take 2 tablets (1,000 mg total) by mouth 2 (two) times daily. 120 tablet 3  . NOVOLOG FLEXPEN  100 UNIT/ML FlexPen as directed.  0  . pantoprazole (PROTONIX) 40 MG tablet Take 1 tablet (40 mg total) by mouth daily. 90 tablet 1  . sertraline (ZOLOFT) 100 MG tablet Place 1 tablet (100 mg total) into feeding tube daily. 30 tablet 5  . SUMAtriptan (IMITREX) 100 MG tablet At the onset of a migraine; may repeat in 2 hours. Max daily dose 200mg 20 tablet 1  . tiZANidine (ZANAFLEX) 4 MG tablet Take 1 tablet (4 mg total) by mouth every 8 (eight) hours as needed for muscle spasms. 180 tablet 1  . topiramate (TOPAMAX) 100 MG tablet Take 1 tablet  (100 mg total) by mouth 2 (two) times daily. 180 tablet 0  . albuterol (PROVENTIL HFA;VENTOLIN HFA) 108 (90 Base) MCG/ACT inhaler Inhale 2 puffs into the lungs every 6 (six) hours as needed for wheezing or shortness of breath. 1 Inhaler 2  . Fluticasone-Salmeterol (ADVAIR DISKUS) 250-50 MCG/DOSE AEPB Inhale 1 puff into the lungs 2 (two) times daily. 180 each 0  . chlorhexidine gluconate, MEDLINE KIT, (PERIDEX) 0.12 % solution 15 mLs by Mouth Rinse route 2 (two) times daily. (Patient not taking: Reported on 10/15/2017) 120 mL 0  . Dulaglutide 1.5 MG/0.5ML SOPN Inject 1.5 mg into the skin once a week. (Patient not taking: Reported on 06/12/2017) 4 pen 11  . glucose blood (ACCU-CHEK AVIVA) test strip Use as directed to check blood sugars once daily. E11.9 (Patient not taking: Reported on 06/12/2017) 100 each 12  . sodium chloride HYPERTONIC 3 % nebulizer solution Take 4 mLs by nebulization 2 (two) times daily. (Patient not taking: Reported on 06/12/2017) 750 mL 0  . benzonatate (TESSALON) 100 MG capsule Take 1 capsule (100 mg total) by mouth every 8 (eight) hours. (Patient not taking: Reported on 10/15/2017) 60 capsule 0  . HYDROcodone-homatropine (HYCODAN) 5-1.5 MG/5ML syrup Take 5 mLs by mouth every 6 (six) hours as needed for cough. (Patient not taking: Reported on 10/15/2017) 120 mL 0   No facility-administered medications prior to visit.     ROS Review of Systems  Constitutional: Negative for activity change and appetite change.  HENT: Negative for sinus pressure and sore throat.   Respiratory: Positive for cough. Negative for chest tightness, shortness of breath and wheezing.   Cardiovascular: Negative for chest pain and palpitations.  Gastrointestinal: Negative for abdominal distention, abdominal pain and constipation.  Genitourinary: Negative.   Musculoskeletal: Negative.   Psychiatric/Behavioral: Negative for behavioral problems and dysphoric mood.    Objective:  BP 138/90   Pulse (!) 106    Temp 98.9 F (37.2 C) (Oral)   Ht 5' 1" (1.549 m)   Wt 230 lb 6.4 oz (104.5 kg)   SpO2 95%   BMI 43.53 kg/m   BP/Weight 10/15/2017 07/14/2017 07/02/2017  Systolic BP 138 134 153  Diastolic BP 90 93 107  Wt. (Lbs) 230.4 232.2 -  BMI 43.53 43.87 -      Physical Exam  Constitutional: She is oriented to person, place, and time. She appears well-developed and well-nourished.  HENT:  Tracheostomy in place  Cardiovascular: Normal heart sounds and intact distal pulses. Tachycardia present.  No murmur heard. Pulmonary/Chest: Effort normal. She has wheezes (b/l expiratory wheezes in alllung zones). She has no rales. She exhibits no tenderness.  Abdominal: Soft. Bowel sounds are normal. She exhibits no distension and no mass. There is no tenderness.  Musculoskeletal: Normal range of motion.  Neurological: She is alert and oriented to person, place, and time.  Skin: Skin   is warm and dry.  Psychiatric: She has a normal mood and affect.     Lab Results  Component Value Date   HGBA1C 13.3 10/15/2017      Assessment & Plan:   1. Type 2 diabetes mellitus with diabetic polyneuropathy, without long-term current use of insulin (HCC) Uncontrolled with A!C of 13.3 She has been on multiple courses of steroids and couple with the fact that she has not been taking her Dilaglutide which was prescribed by endocrine Advised to continue regimen prescribed by [facility] at discharge and schedule an appointment with her endocrinologist Will review blood sugar log at next visit. - POCT glucose (manual entry) - POCT glycosylated hemoglobin (Hb A1C) - Insulin Pen Needle 31G X 5 MM MISC; 1 each by Does not apply route at bedtime.  Dispense: 30 each; Refill: 5  2. Tracheal stenosis S/p reconstruction Continue suction Keep upcoming appointment with ENT  3. Essential hypertension Controlled Counseled on blood pressure goal of less than 130/80, low-sodium, DASH diet, medication compliance, 150 minutes  of moderate intensity exercise per week. Discussed medication compliance, adverse effects.   4. Cough Hopefully addition of singulair will help with post nasal drip - benzonatate (TESSALON) 100 MG capsule; Take 1 capsule (100 mg total) by mouth every 8 (eight) hours.  Dispense: 60 capsule; Refill: 0 - HYDROcodone-homatropine (HYCODAN) 5-1.5 MG/5ML syrup; Take 5 mLs by mouth every 6 (six) hours as needed for cough.  Dispense: 120 mL; Refill: 0  5. Severe asthma with acute exacerbation, unspecified whether persistent Uncontrolled She has not been using her Advair due to inability to use the diskus - switched to [facility] Singular added. - montelukast (SINGULAIR) 10 MG tablet; Take 1 tablet (10 mg total) by mouth at bedtime.  Dispense: 30 tablet; Refill: 3 - albuterol (PROVENTIL HFA;VENTOLIN HFA) 108 (90 Base) MCG/ACT inhaler; Inhale 2 puffs into the lungs every 6 (six) hours as needed for wheezing or shortness of breath.  Dispense: 1 Inhaler; Refill: 2   Meds ordered this encounter  Medications  . benzonatate (TESSALON) 100 MG capsule    Sig: Take 1 capsule (100 mg total) by mouth every 8 (eight) hours.    Dispense:  60 capsule    Refill:  0  . HYDROcodone-homatropine (HYCODAN) 5-1.5 MG/5ML syrup    Sig: Take 5 mLs by mouth every 6 (six) hours as needed for cough.    Dispense:  120 mL    Refill:  0  . DISCONTD: Insulin Glargine (LANTUS SOLOSTAR) 100 UNIT/ML Solostar Pen    Sig: Inject 50 Units into the skin daily at 10 pm.    Dispense:  5 pen    Refill:  1  . Insulin Pen Needle 31G X 5 MM MISC    Sig: 1 each by Does not apply route at bedtime.    Dispense:  30 each    Refill:  5  . montelukast (SINGULAIR) 10 MG tablet    Sig: Take 1 tablet (10 mg total) by mouth at bedtime.    Dispense:  30 tablet    Refill:  3  . albuterol (PROVENTIL HFA;VENTOLIN HFA) 108 (90 Base) MCG/ACT inhaler    Sig: Inhale 2 puffs into the lungs every 6 (six) hours as needed for wheezing or shortness of breath.     Dispense:  1 Inhaler    Refill:  2  . DISCONTD: fluticasone-salmeterol (ADVAIR HFA) 230-21 MCG/ACT inhaler    Sig: Inhale 2 puffs into the lungs 2 (two) times daily.  Dispense:  1 Inhaler    Refill:  3    Follow-up: Return in about 1 month (around 11/15/2017) for follow Up on diabetes mellitus.   [name] MD

## 2017-10-16 NOTE — Telephone Encounter (Signed)
It looks like she has [employer] Part D so it looks like Symbicort is covered too.

## 2017-10-16 NOTE — Telephone Encounter (Signed)
She has a tracheostomy and is unable to use a diskus. What other options do we have for ICS/LABA as her asthma is uncontrolled?

## 2017-10-19 DIAGNOSIS — Z87891 Personal history of nicotine dependence: Secondary | ICD-10-CM | POA: Diagnosis not present

## 2017-10-19 DIAGNOSIS — J386 Stenosis of larynx: Secondary | ICD-10-CM | POA: Diagnosis not present

## 2017-10-19 DIAGNOSIS — J398 Other specified diseases of upper respiratory tract: Secondary | ICD-10-CM | POA: Diagnosis not present

## 2017-10-19 DIAGNOSIS — Z930 Tracheostomy status: Secondary | ICD-10-CM | POA: Diagnosis not present

## 2017-10-29 ENCOUNTER — Ambulatory Visit: Payer: Medicare Other | Admitting: Endocrinology

## 2017-10-30 DIAGNOSIS — E669 Obesity, unspecified: Secondary | ICD-10-CM | POA: Diagnosis not present

## 2017-10-30 DIAGNOSIS — I5032 Chronic diastolic (congestive) heart failure: Secondary | ICD-10-CM | POA: Diagnosis not present

## 2017-10-30 DIAGNOSIS — G43909 Migraine, unspecified, not intractable, without status migrainosus: Secondary | ICD-10-CM | POA: Diagnosis not present

## 2017-10-30 DIAGNOSIS — K219 Gastro-esophageal reflux disease without esophagitis: Secondary | ICD-10-CM | POA: Diagnosis not present

## 2017-10-30 DIAGNOSIS — J45909 Unspecified asthma, uncomplicated: Secondary | ICD-10-CM | POA: Diagnosis not present

## 2017-10-30 DIAGNOSIS — G4733 Obstructive sleep apnea (adult) (pediatric): Secondary | ICD-10-CM | POA: Diagnosis not present

## 2017-10-30 DIAGNOSIS — F419 Anxiety disorder, unspecified: Secondary | ICD-10-CM | POA: Diagnosis not present

## 2017-10-30 DIAGNOSIS — Z6841 Body Mass Index (BMI) 40.0 and over, adult: Secondary | ICD-10-CM | POA: Diagnosis not present

## 2017-10-30 DIAGNOSIS — E785 Hyperlipidemia, unspecified: Secondary | ICD-10-CM | POA: Diagnosis not present

## 2017-10-30 DIAGNOSIS — E1165 Type 2 diabetes mellitus with hyperglycemia: Secondary | ICD-10-CM | POA: Diagnosis not present

## 2017-10-30 DIAGNOSIS — I110 Hypertensive heart disease with heart failure: Secondary | ICD-10-CM | POA: Diagnosis not present

## 2017-10-30 DIAGNOSIS — F329 Major depressive disorder, single episode, unspecified: Secondary | ICD-10-CM | POA: Diagnosis not present

## 2017-10-30 DIAGNOSIS — Z87891 Personal history of nicotine dependence: Secondary | ICD-10-CM | POA: Diagnosis not present

## 2017-10-30 DIAGNOSIS — Z7984 Long term (current) use of oral hypoglycemic drugs: Secondary | ICD-10-CM | POA: Diagnosis not present

## 2017-10-30 DIAGNOSIS — J386 Stenosis of larynx: Secondary | ICD-10-CM | POA: Diagnosis not present

## 2017-10-30 DIAGNOSIS — Z930 Tracheostomy status: Secondary | ICD-10-CM | POA: Diagnosis not present

## 2017-10-30 DIAGNOSIS — Z79899 Other long term (current) drug therapy: Secondary | ICD-10-CM | POA: Diagnosis not present

## 2017-10-31 DIAGNOSIS — I5032 Chronic diastolic (congestive) heart failure: Secondary | ICD-10-CM | POA: Diagnosis not present

## 2017-10-31 DIAGNOSIS — E669 Obesity, unspecified: Secondary | ICD-10-CM | POA: Diagnosis not present

## 2017-10-31 DIAGNOSIS — Z430 Encounter for attention to tracheostomy: Secondary | ICD-10-CM | POA: Diagnosis not present

## 2017-10-31 DIAGNOSIS — G4733 Obstructive sleep apnea (adult) (pediatric): Secondary | ICD-10-CM | POA: Diagnosis not present

## 2017-10-31 DIAGNOSIS — I110 Hypertensive heart disease with heart failure: Secondary | ICD-10-CM | POA: Diagnosis not present

## 2017-10-31 DIAGNOSIS — E785 Hyperlipidemia, unspecified: Secondary | ICD-10-CM | POA: Diagnosis not present

## 2017-10-31 DIAGNOSIS — J386 Stenosis of larynx: Secondary | ICD-10-CM | POA: Diagnosis not present

## 2017-11-11 ENCOUNTER — Other Ambulatory Visit: Payer: Self-pay | Admitting: Family Medicine

## 2017-11-11 DIAGNOSIS — M545 Low back pain: Principal | ICD-10-CM

## 2017-11-11 DIAGNOSIS — G8929 Other chronic pain: Secondary | ICD-10-CM

## 2017-11-16 ENCOUNTER — Other Ambulatory Visit: Payer: Self-pay | Admitting: Family Medicine

## 2017-11-16 MED ORDER — ALBUTEROL SULFATE HFA 108 (90 BASE) MCG/ACT IN AERS: 2.0000 | INHALATION_SPRAY | Freq: Four times a day (QID) | RESPIRATORY_TRACT | 3 refills | Status: DC | PRN

## 2017-11-17 ENCOUNTER — Ambulatory Visit: Payer: Medicare Other | Admitting: Family Medicine

## 2017-11-27 ENCOUNTER — Ambulatory Visit (INDEPENDENT_AMBULATORY_CARE_PROVIDER_SITE_OTHER): Payer: Medicare Other | Admitting: Endocrinology

## 2017-11-27 ENCOUNTER — Encounter: Payer: Self-pay | Admitting: Endocrinology

## 2017-11-27 VITALS — BP 144/82 | HR 98 | Wt 241.6 lb

## 2017-11-27 DIAGNOSIS — E1142 Type 2 diabetes mellitus with diabetic polyneuropathy: Secondary | ICD-10-CM | POA: Diagnosis not present

## 2017-11-27 LAB — POCT GLYCOSYLATED HEMOGLOBIN (HGB A1C): Hemoglobin A1C: 11.8

## 2017-11-27 MED ORDER — BASAGLAR KWIKPEN 100 UNIT/ML ~~LOC~~ SOPN: 100.0000 [IU] | PEN_INJECTOR | SUBCUTANEOUS | 11 refills | Status: DC

## 2017-11-27 NOTE — Progress Notes (Signed)
Subjective:    Patient ID: [name], female    DOB: 1971-01-27, 47 y.o.   MRN: [id number]  HPI Pt returns for f/u of diabetes mellitus: DM type: Insulin-requiring type 2 Dx'ed: 3570 Complications: renal insuff and painful polyneuropathy.  Therapy: trulicity.  GDM: never DKA: never.  Severe hypoglycemia: never.  Pancreatitis: never.   Other: she declines weight loss surgery; edema precludes pioglitizone rx; she did not tolerate metformin (diarrhea); she took insulin from 2012-2017.   Interval history: She has been off steroids x 6 weeks.  She has resumed insulin.  She takes basaglar 50/d, and PRN novolog (averages approx 50/d).  pt states she feels better in general.  Past Medical History:  Diagnosis Date  . Arthritis   . Asthma   . Chronic back pain   . Chronic chest pain   . Depression   . DM (diabetes mellitus) ([place])    INSULIN DEPENDENT  . GERD (gastroesophageal reflux disease)   . Headache(784.0)   . Hypertension   . Hypokalemia   . Respiratory disease 05/2017  . Tracheostomy in place [facility]) 02/2017    Past Surgical History:  Procedure Laterality Date  . APPENDECTOMY    . CESAREAN SECTION     x 3  . CHOLECYSTECTOMY N/A 03/02/2014   Procedure: LAPAROSCOPIC CHOLECYSTECTOMY;  Surgeon: Joyice Faster. Cornett, MD;  Location: [place];  Service: General;  Laterality: N/A;  . HERNIA REPAIR    . PANENDOSCOPY N/A 03/04/2017   Procedure: PANENDOSCOPY WITH POSSIBLE FOREIGN BODY REMOVAL;  Surgeon: Jodi Marble, MD;  Location: [place];  Service: ENT;  Laterality: N/A;  . ROTATOR CUFF REPAIR    . TRACHEOSTOMY  02/2017  . VESICOVAGINAL FISTULA CLOSURE W/ TAH  2009    Social History   Socioeconomic History  . Marital status: Married    Spouse name: Not on file  . Number of children: Not on file  . Years of education: Not on file  . Highest education level: Not on file  Social Needs  . Financial resource strain: Not on file  . Food insecurity - worry: Not on  file  . Food insecurity - inability: Not on file  . Transportation needs - medical: Not on file  . Transportation needs - non-medical: Not on file  Occupational History  . Not on file  Tobacco Use  . Smoking status: Former Smoker    Packs/day: 0.25    Years: 22.00    Pack years: 5.50    Types: Cigarettes    Last attempt to quit: 01/20/2017    Years since quitting: 0.8  . Smokeless tobacco: Never Used  Substance and Sexual Activity  . Alcohol use: No    Alcohol/week: 0.6 - 1.2 oz    Types: 1 - 2 Glasses of wine per week    Comment: socially  . Drug use: No  . Sexual activity: Yes    Partners: Male  Other Topics Concern  . Not on file  Social History Narrative  . Not on file    Current Outpatient Medications on File Prior to Visit  Medication Sig Dispense Refill  . ACCU-CHEK SOFTCLIX LANCETS lancets Use as instructed to check blood sugar once daily. E11.9 100 each 12  . albuterol (PROAIR HFA) 108 (90 Base) MCG/ACT inhaler Inhale 2 puffs into the lungs every 6 (six) hours as needed for wheezing or shortness of breath. 1 Inhaler 3  . albuterol (PROVENTIL) (2.5 MG/3ML) 0.083% nebulizer solution Take 3 mLs (2.5 mg total)  by nebulization every 6 (six) hours as needed for wheezing or shortness of breath. Reported on 02/22/2016 75 mL 3  . amLODipine (NORVASC) 10 MG tablet Take 1 tablet (10 mg total) by mouth daily. 90 tablet 0  . Artificial Tear Ointment (ARTIFICIAL TEARS) ointment Place 1 application into both eyes at bedtime.     Marland Kitchen atorvastatin (LIPITOR) 20 MG tablet Take 1 tablet (20 mg total) by mouth daily. 90 tablet 0  . benzonatate (TESSALON) 100 MG capsule Take 1 capsule (100 mg total) by mouth every 8 (eight) hours. 60 capsule 0  . Blood Glucose Monitoring Suppl (ACCU-CHEK AVIVA PLUS) w/Device KIT Used to check blood sugars once daily. E11.9 1 kit 0  . budesonide-formoterol (SYMBICORT) 160-4.5 MCG/ACT inhaler Inhale 2 puffs into the lungs 2 (two) times daily. 1 Inhaler 3  .  carvedilol (COREG) 12.5 MG tablet Take 1 tablet (12.5 mg total) by mouth 2 (two) times daily with a meal. 60 tablet 3  . chlorhexidine gluconate, MEDLINE KIT, (PERIDEX) 0.12 % solution 15 mLs by Mouth Rinse route 2 (two) times daily. (Patient not taking: Reported on 10/15/2017) 120 mL 0  . gabapentin (NEURONTIN) 300 MG capsule Take 1 capsule (300 mg total) by mouth 3 (three) times daily. 90 capsule 5  . glucose blood (ACCU-CHEK AVIVA) test strip Use as directed to check blood sugars once daily. E11.9 (Patient not taking: Reported on 06/12/2017) 100 each 12  . HYDROcodone-homatropine (HYCODAN) 5-1.5 MG/5ML syrup Take 5 mLs by mouth every 6 (six) hours as needed for cough. 120 mL 0  . Insulin Pen Needle 31G X 5 MM MISC 1 each by Does not apply route at bedtime. 30 each 5  . montelukast (SINGULAIR) 10 MG tablet Take 1 tablet (10 mg total) by mouth at bedtime. 30 tablet 3  . pantoprazole (PROTONIX) 40 MG tablet Take 1 tablet (40 mg total) by mouth daily. 90 tablet 1  . sertraline (ZOLOFT) 100 MG tablet Place 1 tablet (100 mg total) into feeding tube daily. 30 tablet 5  . sodium chloride HYPERTONIC 3 % nebulizer solution Take 4 mLs by nebulization 2 (two) times daily. (Patient not taking: Reported on 06/12/2017) 750 mL 0  . SUMAtriptan (IMITREX) 100 MG tablet At the onset of a migraine; may repeat in 2 hours. Max daily dose 225m 20 tablet 1  . tiZANidine (ZANAFLEX) 4 MG tablet TAKE 1 TABLET BY MOUTH EVERY 8 HOURS AS NEEDED FOR MUSCLE SPASM 180 tablet 1  . topiramate (TOPAMAX) 100 MG tablet Take 1 tablet (100 mg total) by mouth 2 (two) times daily. 180 tablet 0  . [DISCONTINUED] fluticasone-salmeterol (ADVAIR HFA) 230-21 MCG/ACT inhaler Inhale 2 puffs into the lungs 2 (two) times daily. 1 Inhaler 3  . [DISCONTINUED] promethazine (PHENERGAN) 25 MG tablet Take 1 tablet (25 mg total) by mouth every 6 (six) hours as needed for nausea. (Patient not taking: Reported on 12/13/2014) 20 tablet 0   No current  facility-administered medications on file prior to visit.     Allergies  Allergen Reactions  . Reglan [Metoclopramide] Other (See Comments)    Panic attack    Family History  Problem Relation Age of Onset  . Heart attack Mother   . Stroke Mother   . Diabetes Mother   . Hypertension Mother   . Arthritis Mother   . Stroke Father   . Hypertension Sister   . Diabetes Sister   . Seizures Brother   . Diabetes Brother     BP (Marland Kitchen  144/82 (BP Location: Left Arm, Patient Position: Sitting, Cuff Size: Normal)   Pulse 98   Wt 241 lb 9.6 oz (109.6 kg)   SpO2 99%   BMI 45.65 kg/m   Review of Systems She denies hypoglycemia    Objective:   Physical Exam VITAL SIGNS:  See vs page GENERAL: no distress.  Has tracheostomy.  Pulses: foot pulses are intact bilaterally.   MSK: no deformity of the feet or ankles.  CV: 1+ bilat bilat edema of the legs.  Skin:  no ulcer on the feet or ankles.  normal color and temp on the feet and ankles Neuro: sensation is intact to touch on the feet and ankles.   Lab Results  Component Value Date   CREATININE 1.20 (H) 07/02/2017   BUN 14 07/02/2017   NA 137 07/02/2017   K 3.8 07/02/2017   CL 106 07/02/2017   CO2 20 (L) 07/02/2017   Lab Results  Component Value Date   HGBA1C 11.8 11/27/2017       Assessment & Plan:  Insulin-requiring type 2 DM, with polyneuropathy: severe exacerbation: she should try a simpler regimen  resp failure, worse since last ov here.  We may find that she needs a faster-acting QD insulin.   Patient Instructions  For now, please: Stop taking the metformin and novolog, and: Increase the basaglar to 100 units each morning please come back for a follow-up appointment in 1 month check your blood sugar twice a day.  vary the time of day when you check, between before the 3 meals, and at bedtime.  also check if you have symptoms of your blood sugar being too high or too low.  please keep a record of the readings and bring  it to your next appointment here (or you can bring the meter itself).  You can write it on any piece of paper.  please call us sooner if your blood sugar goes below 70, or if you have a lot of readings over 200.

## 2017-11-27 NOTE — Patient Instructions (Addendum)
For now, please: Stop taking the metformin and novolog, and: Increase the basaglar to 100 units each morning please come back for a follow-up appointment in 1 month check your blood sugar twice a day.  vary the time of day when you check, between before the 3 meals, and at bedtime.  also check if you have symptoms of your blood sugar being too high or too low.  please keep a record of the readings and bring it to your next appointment here (or you can bring the meter itself).  You can write it on any piece of paper.  please call us sooner if your blood sugar goes below 70, or if you have a lot of readings over 200.

## 2017-12-04 IMAGING — DX DG CHEST 1V PORT
1 series · 1 of 1 positions shown · non-contrast
Comparison: Chest radiograph 03/06/2017

CLINICAL DATA: Shortness of breath

EXAM:
PORTABLE CHEST 1 VIEW

[chest ap]
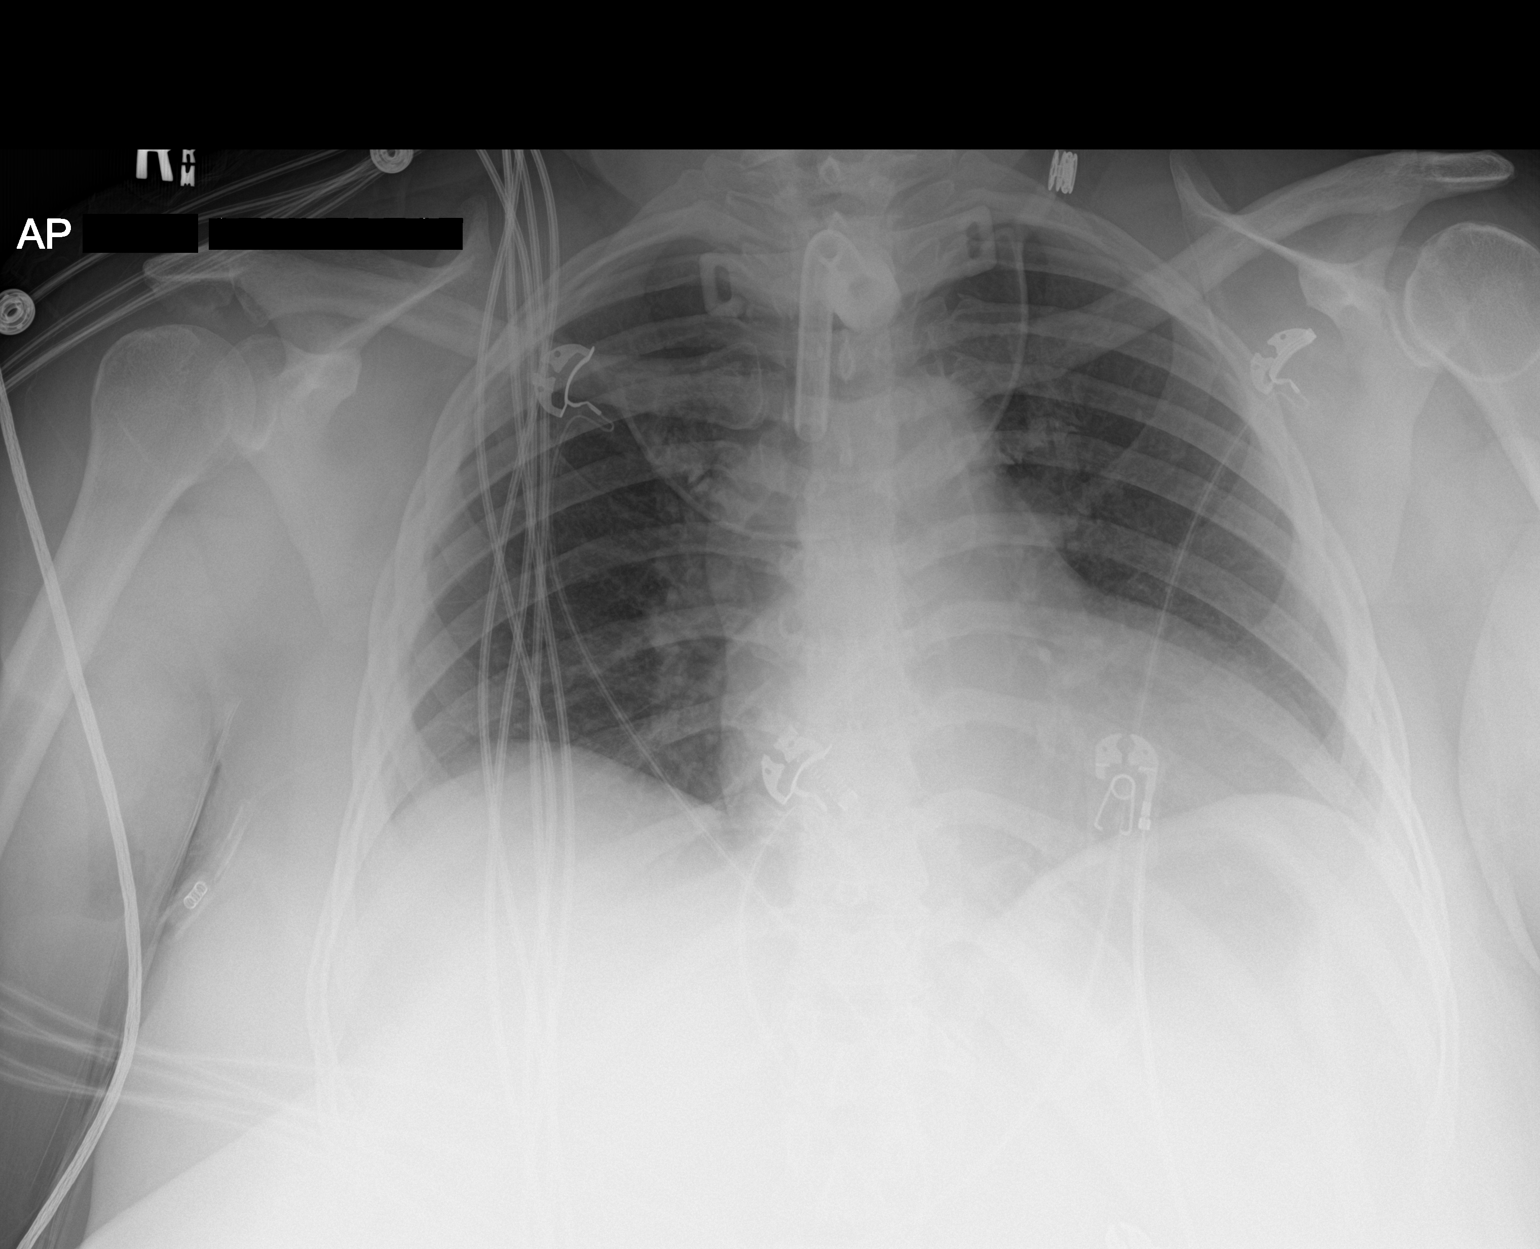

[1 of 1 positions shown; findings below may reference images not displayed]

FINDINGS: Tracheostomy tube tip is in appropriate position at the level of the
clavicles. The lungs are clear. Normal cardiomediastinal contours.
IMPRESSION: No focal airspace disease.

## 2017-12-04 IMAGING — CT CT ANGIO CHEST
2 of 8 series · 18 of 46 positions shown · IV contrast (isovue)
Comparison: Chest CT 03/04/2017

CLINICAL DATA: Shortness of breath and hemoptysis

EXAM:
CT ANGIOGRAPHY CHEST WITH CONTRAST
TECHNIQUE: Multidetector CT imaging of the chest was performed using the
standard protocol during bolus administration of intravenous
contrast. Multiplanar CT image reconstructions and MIPs were
obtained to evaluate the vascular anatomy.
CONTRAST:  100 mL Isovue 370

[Series 6: thins · axial · 0.80mm/px · z∈[-437,-189]mm · 15 of 274 slices shown]
[im 13/274  lung]
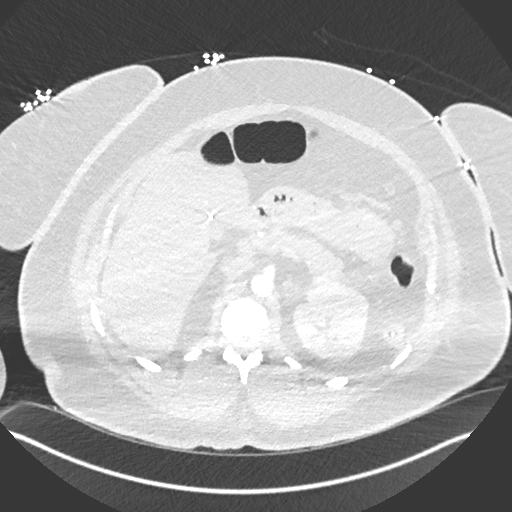
[im 38/274  soft-tissue]
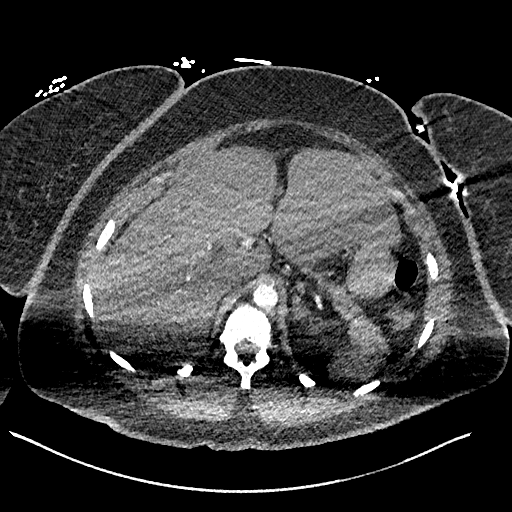
[im 50/274  lung]
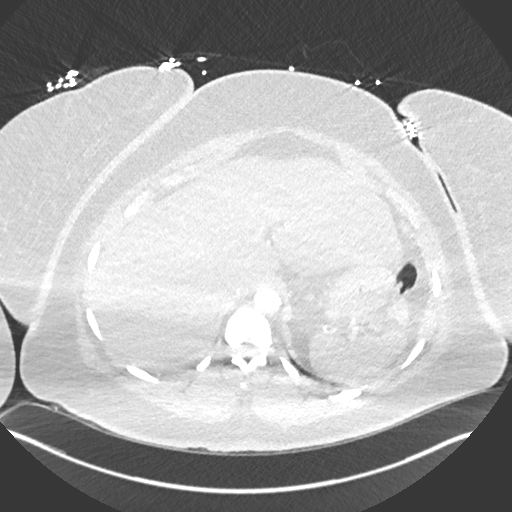
[im 63/274  soft-tissue]
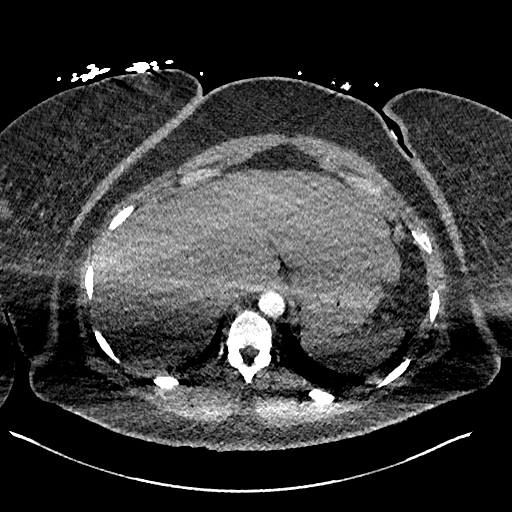
[im 87/274  lung]
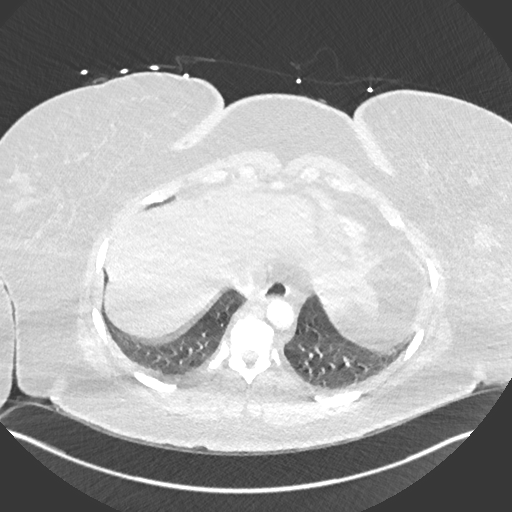
[im 100/274  soft-tissue]
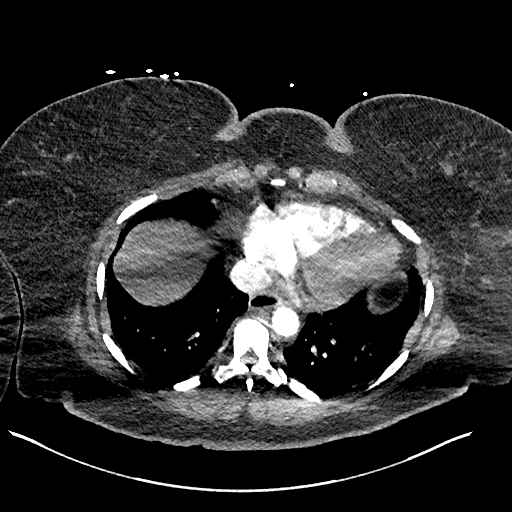
[im 125/274  lung]
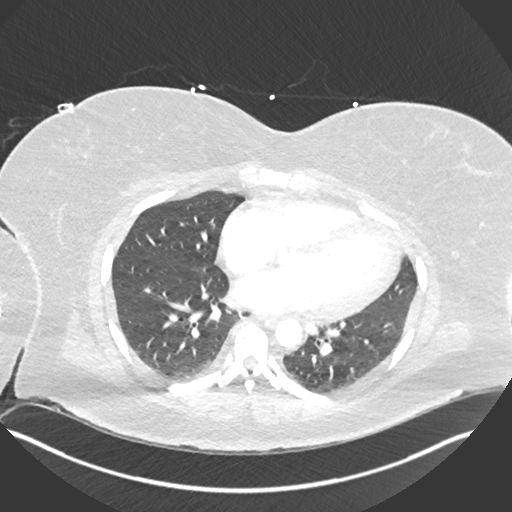
[im 137/274  soft-tissue]
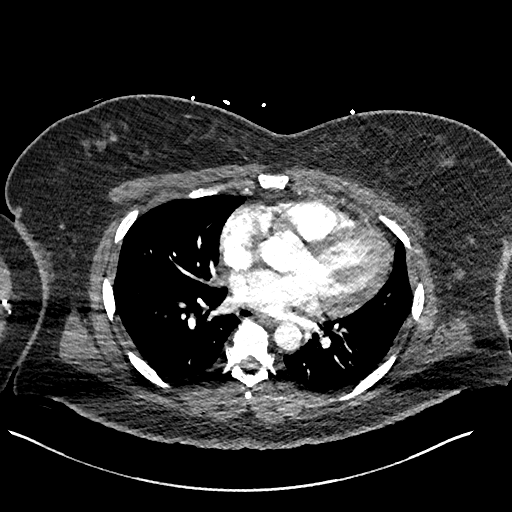
[im 149/274  lung]
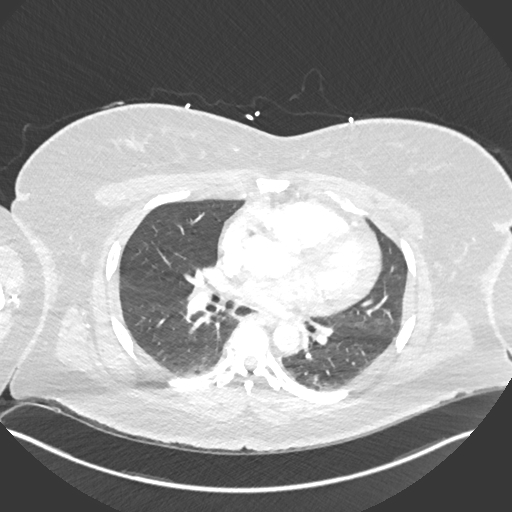
[im 174/274  soft-tissue]
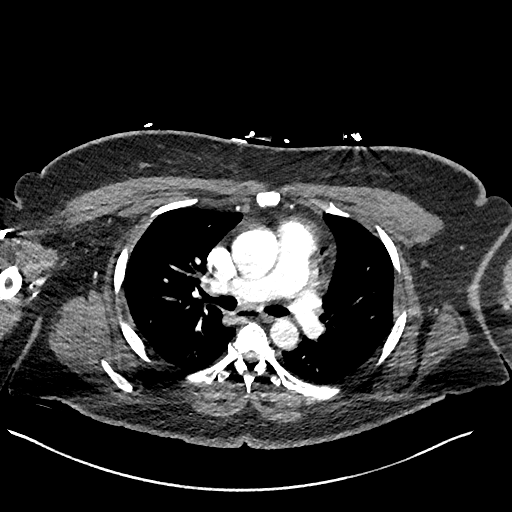
[im 187/274  lung]
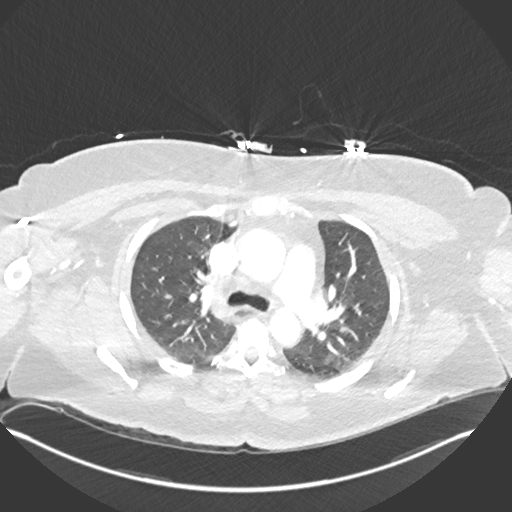
[im 211/274  soft-tissue]
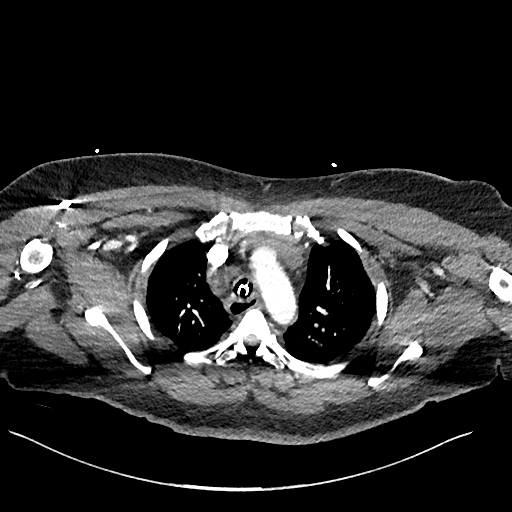
[im 224/274  lung]
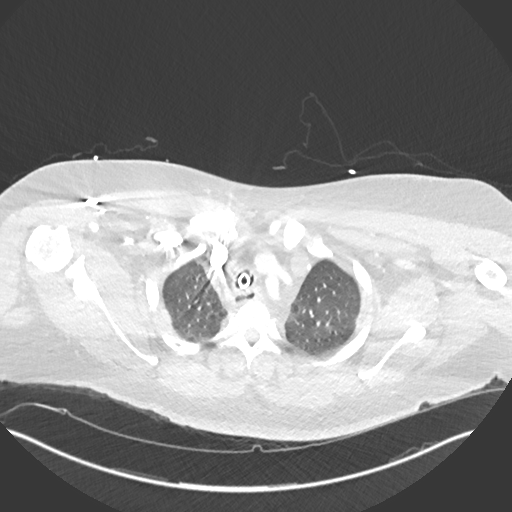
[im 236/274  soft-tissue]
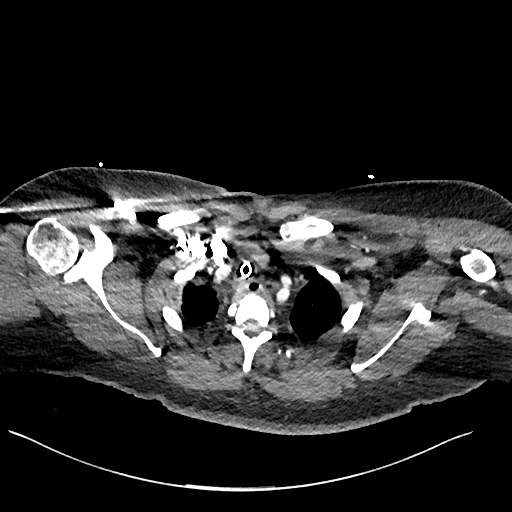
[im 261/274  lung]
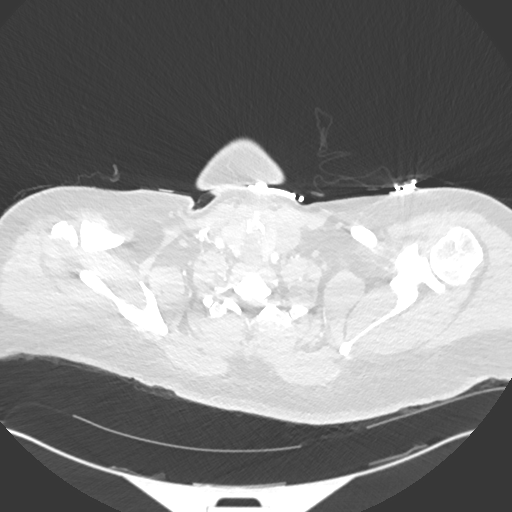

[Series 8: coronal mpr · coronal · 0.55mm/px · 3 of 151 slices shown]
[im 38/151  soft-tissue]
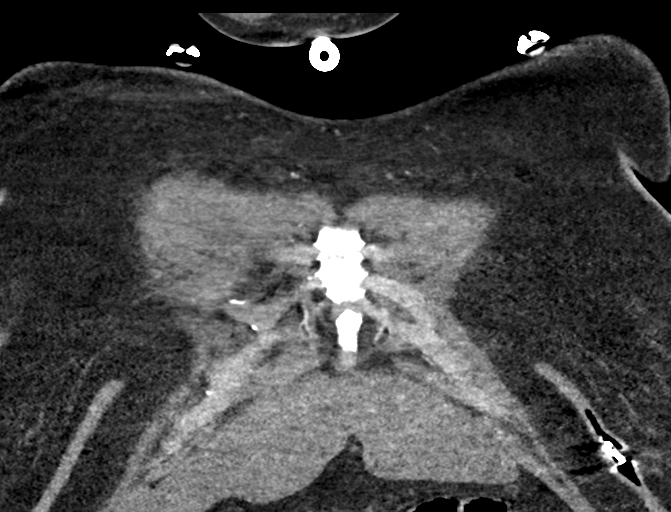
[im 76/151  soft-tissue]
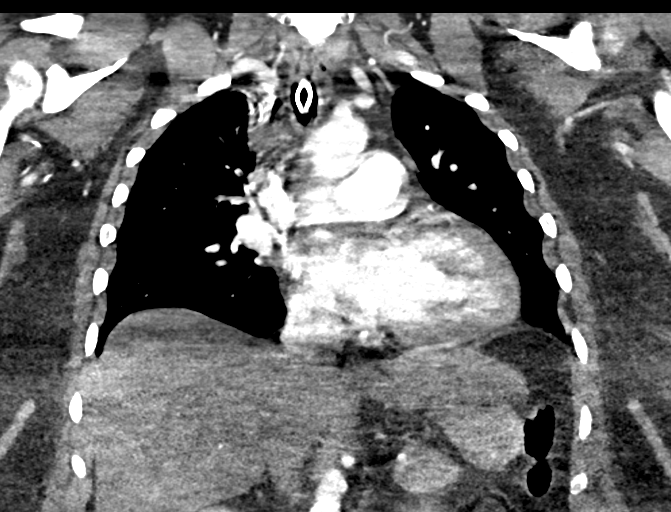
[im 113/151  soft-tissue]
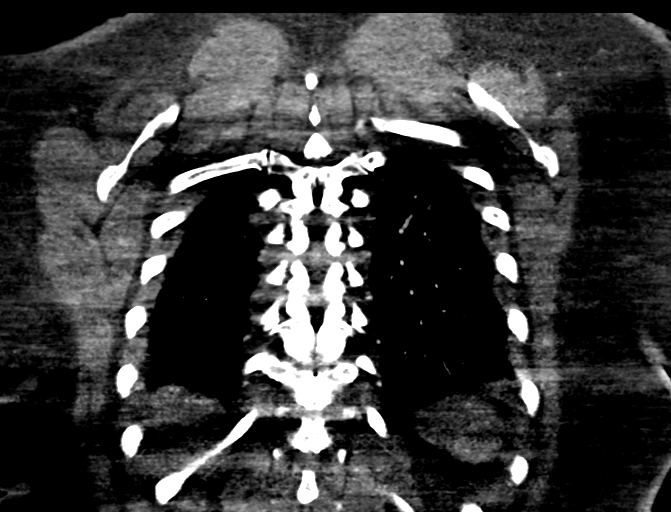

[18 of 46 positions shown; findings below may reference images not displayed]

FINDINGS: Cardiovascular: Contrast injection is sufficient to demonstrate
satisfactory opacification of the pulmonary arteries to the
segmental level. There is no pulmonary embolus. The main pulmonary
artery is within normal limits for size. There is no CT evidence of
acute right heart strain. The visualized aorta is normal. There is a
normal variant aortic arch branching pattern with the
brachiocephalic and left common carotid arteries sharing a common
origin.. Heart size is normal, without pericardial effusion.

Mediastinum/Nodes: No mediastinal, hilar or axillary
lymphadenopathy. The visualized thyroid and thoracic esophageal
course are unremarkable.

Lungs/Pleura: Tracheostomy tube tip at the level of the clavicular
heads. No pulmonary nodules or masses. No pleural effusion or
pneumothorax. No focal airspace consolidation. No focal pleural
abnormality.

Upper Abdomen: Contrast bolus timing is not optimized for evaluation
of the abdominal organs. Within this limitation, the visualized
organs of the upper abdomen are normal.

Musculoskeletal: No chest wall abnormality. No acute or significant
osseous findings.

Review of the MIP images confirms the above findings.
IMPRESSION: 1. No pulmonary embolus or acute aortic syndrome.
2. No finding to explain the reported hemoptysis.

## 2017-12-04 IMAGING — CT CT NECK W/ CM
3 of 4 series · 12 of 33 positions shown, 14 images · IV contrast (APPLIED)
Comparison: CT neck 03/04/2017

CLINICAL DATA: Chronic laryngitis

EXAM:
CT NECK WITH CONTRAST
TECHNIQUE: Multidetector CT imaging of the neck was performed using the
standard protocol following the bolus administration of intravenous
contrast.
CONTRAST:  100 mL Isovue 370

[Series 7: coronal st · coronal · 0.44mm/px · 3 of 150 slices shown]
[im 30/150  bone]
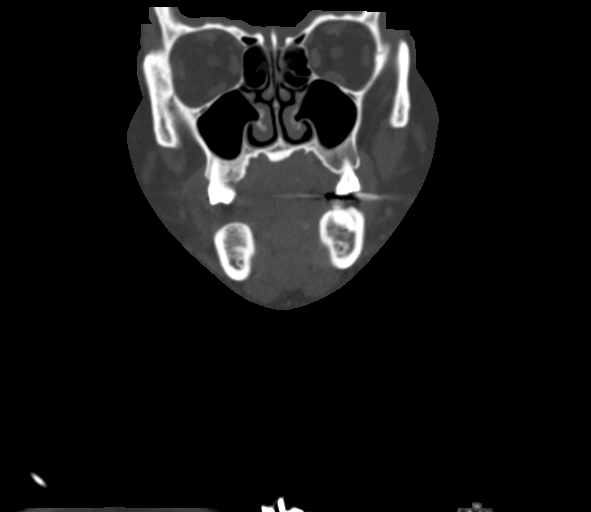
[im 60/150  bone]
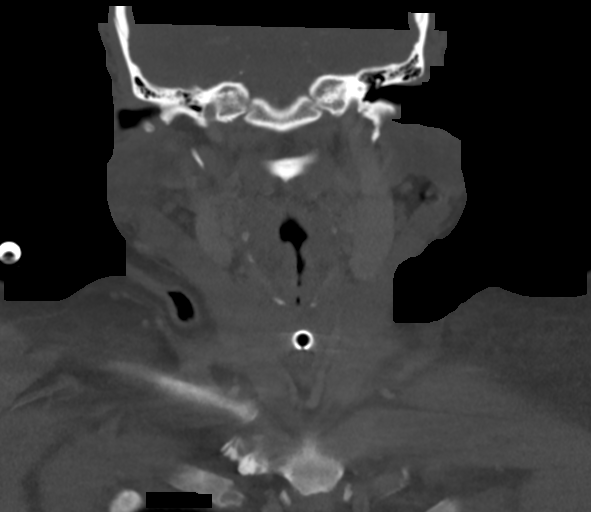
[im 90/150  bone]
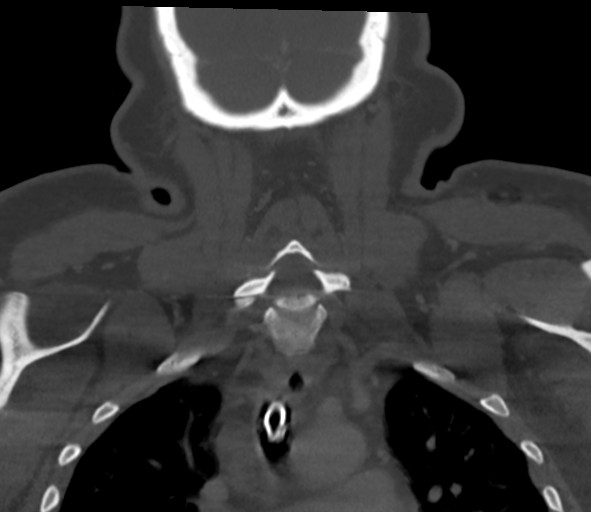

[Series 8: sagittal st · sagittal · 0.45mm/px · 5 of 119 slices shown, 6 images]
[im 40/119  bone]
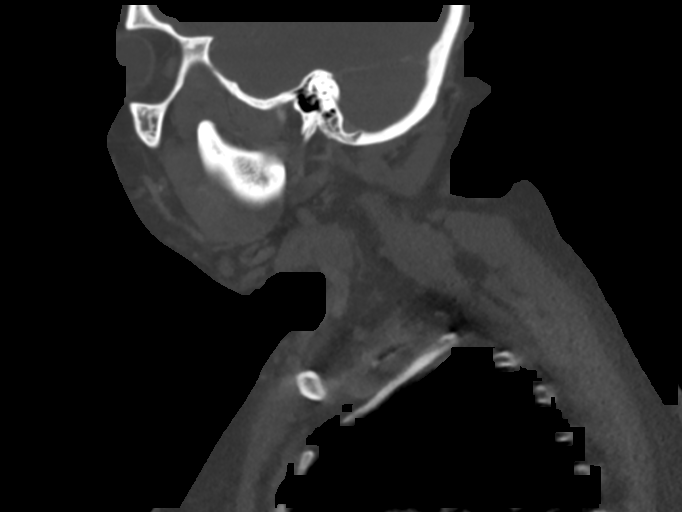
[im 50/119  bone]
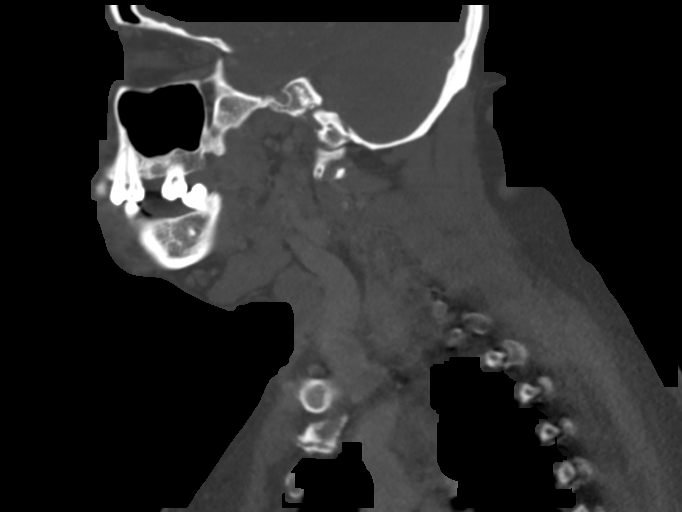
[im 60/119  soft-tissue]
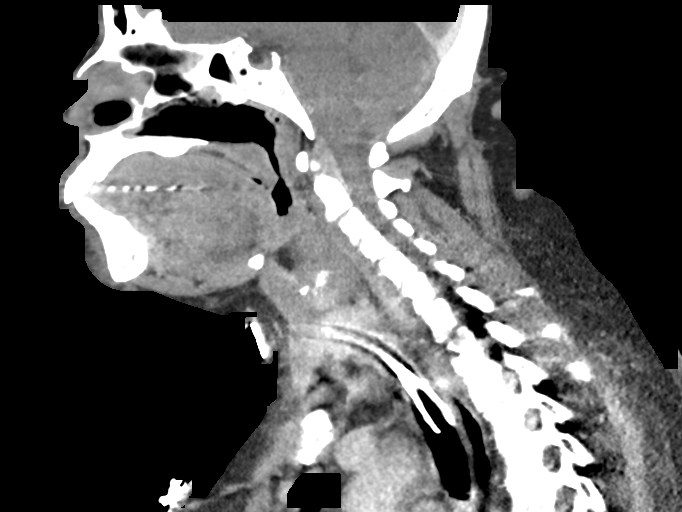
[im 60/119  bone]
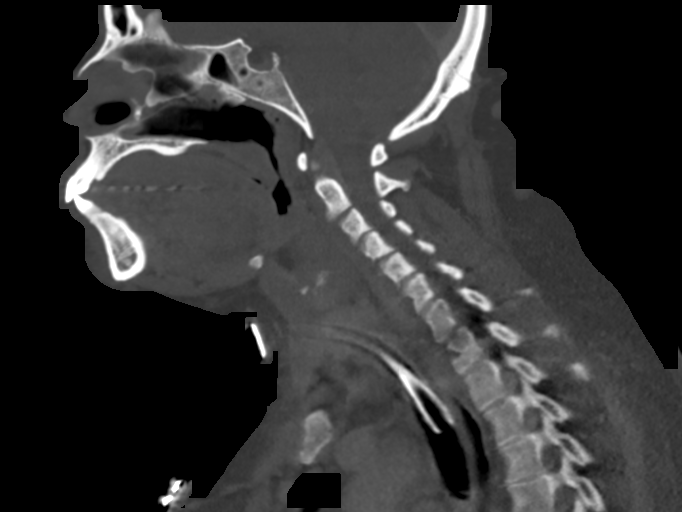
[im 69/119  bone]
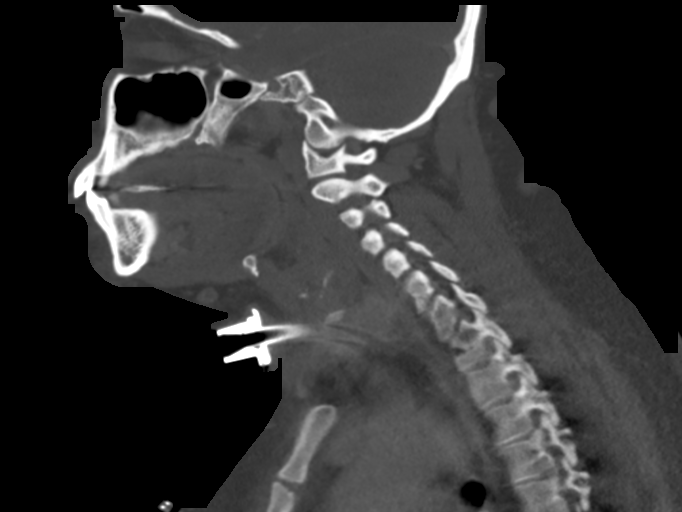
[im 79/119  bone]
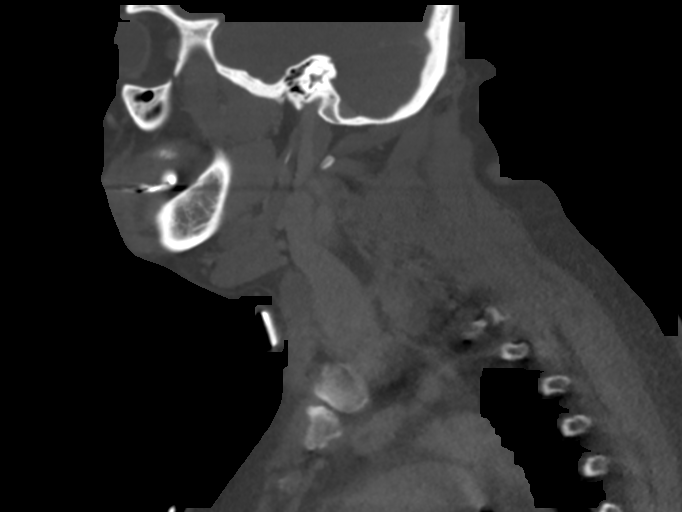

[Series 9: orthogonal st · axial · 0.39mm/px · z∈[-291,-109]mm · 4 of 134 slices shown, 5 images]
[im 20/134  soft-tissue]
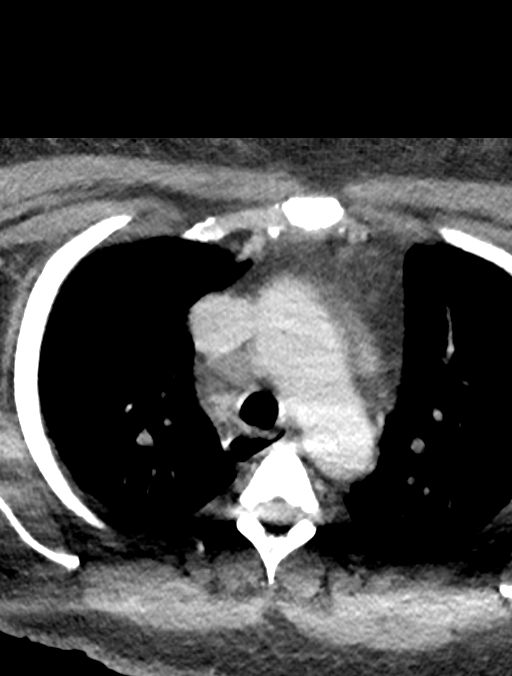
[im 20/134  bone]
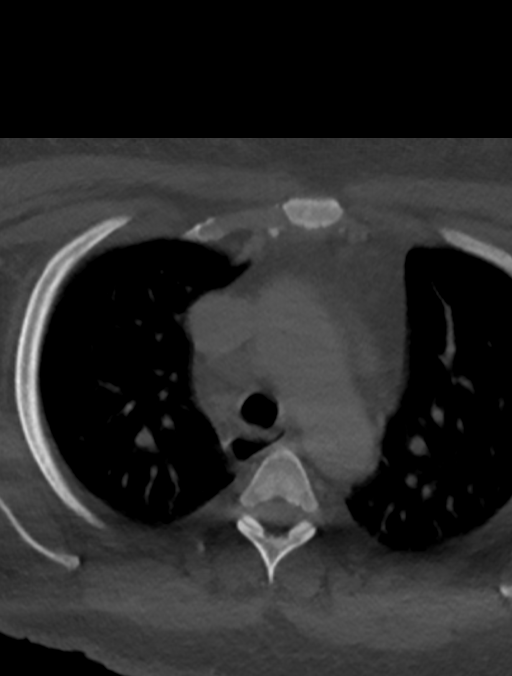
[im 58/134  bone]
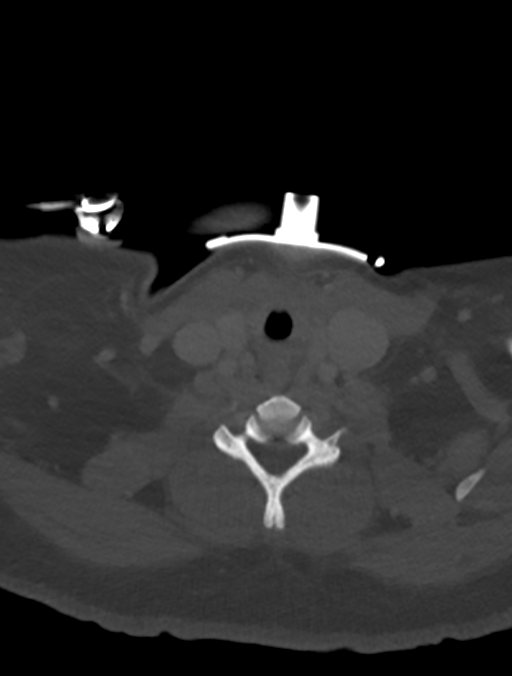
[im 77/134  bone]
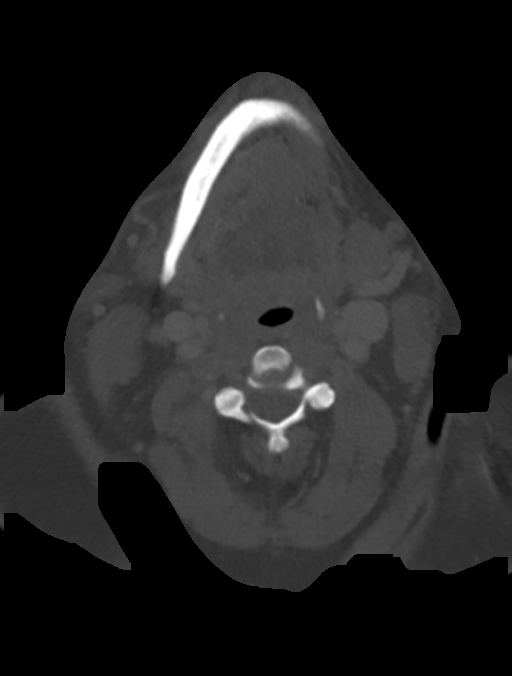
[im 115/134  bone]
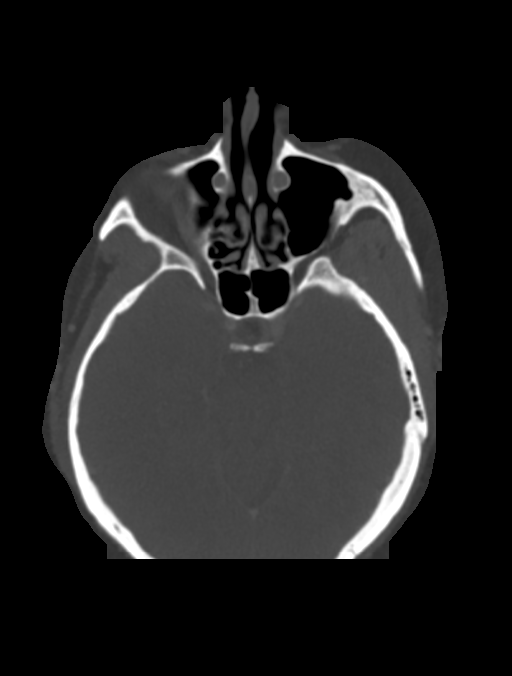

[12 of 33 positions shown; findings below may reference images not displayed]

FINDINGS: Pharynx and larynx:

--Nasopharynx: Fossae of Jim are clear. Normal adenoid
tonsils for age.

--Oral cavity and oropharynx: The palatine and lingual tonsils are
normal. The visible oral cavity and floor of mouth are normal. Small
torus palatini.

--Hypopharynx: Normal vallecula and pyriform sinuses.

--Larynx: Normal epiglottis and pre-epiglottic space. Normal
aryepiglottic and vocal folds. Status post tracheostomy with tube
tip within the trachea at the level of the clavicular heads.

--Retropharyngeal space: No abscess, effusion or lymphadenopathy.

Salivary glands:

--Parotid: No mass lesion or inflammation. No sialolithiasis or
ductal dilatation.

--Submandibular: Symmetric without inflammation. No sialolithiasis
or ductal dilatation.

--Sublingual: Normal. No ranula or other visible lesion of the base
of tongue and floor of mouth.

Thyroid: Normal.

Lymph nodes: No enlarged or abnormal density lymph nodes.

Vascular: Major cervical vessels are patent.

Limited intracranial: Normal.

Visualized orbits: Normal.

Mastoids and visualized paranasal sinuses: No fluid levels or
advanced mucosal thickening. No mastoid effusion.

Skeleton: No bony spinal canal stenosis. No lytic or blastic
lesions.

Upper chest: Clear.

Other: None.
IMPRESSION: 1. Status post tracheostomy with normal positioning of respiratory
tube.
2. No focal pharyngeal or laryngeal abnormality.

## 2017-12-24 DIAGNOSIS — J398 Other specified diseases of upper respiratory tract: Secondary | ICD-10-CM | POA: Diagnosis not present

## 2017-12-24 DIAGNOSIS — Z930 Tracheostomy status: Secondary | ICD-10-CM | POA: Diagnosis not present

## 2017-12-24 DIAGNOSIS — J386 Stenosis of larynx: Secondary | ICD-10-CM | POA: Diagnosis not present

## 2017-12-24 DIAGNOSIS — Z888 Allergy status to other drugs, medicaments and biological substances status: Secondary | ICD-10-CM | POA: Diagnosis not present

## 2017-12-24 DIAGNOSIS — Z9889 Other specified postprocedural states: Secondary | ICD-10-CM | POA: Diagnosis not present

## 2017-12-24 DIAGNOSIS — R05 Cough: Secondary | ICD-10-CM | POA: Diagnosis not present

## 2017-12-29 ENCOUNTER — Ambulatory Visit: Payer: Medicare Other | Admitting: Endocrinology

## 2017-12-31 ENCOUNTER — Ambulatory Visit: Payer: Medicare Other | Admitting: Endocrinology

## 2017-12-31 IMAGING — DX DG CHEST 1V PORT
1 series · 1 of 1 positions shown · non-contrast
Comparison: Chest x-ray dated May 06, 2017.

CLINICAL DATA: Severe shortness of breath.

EXAM:
PORTABLE CHEST 1 VIEW

[chest ap]
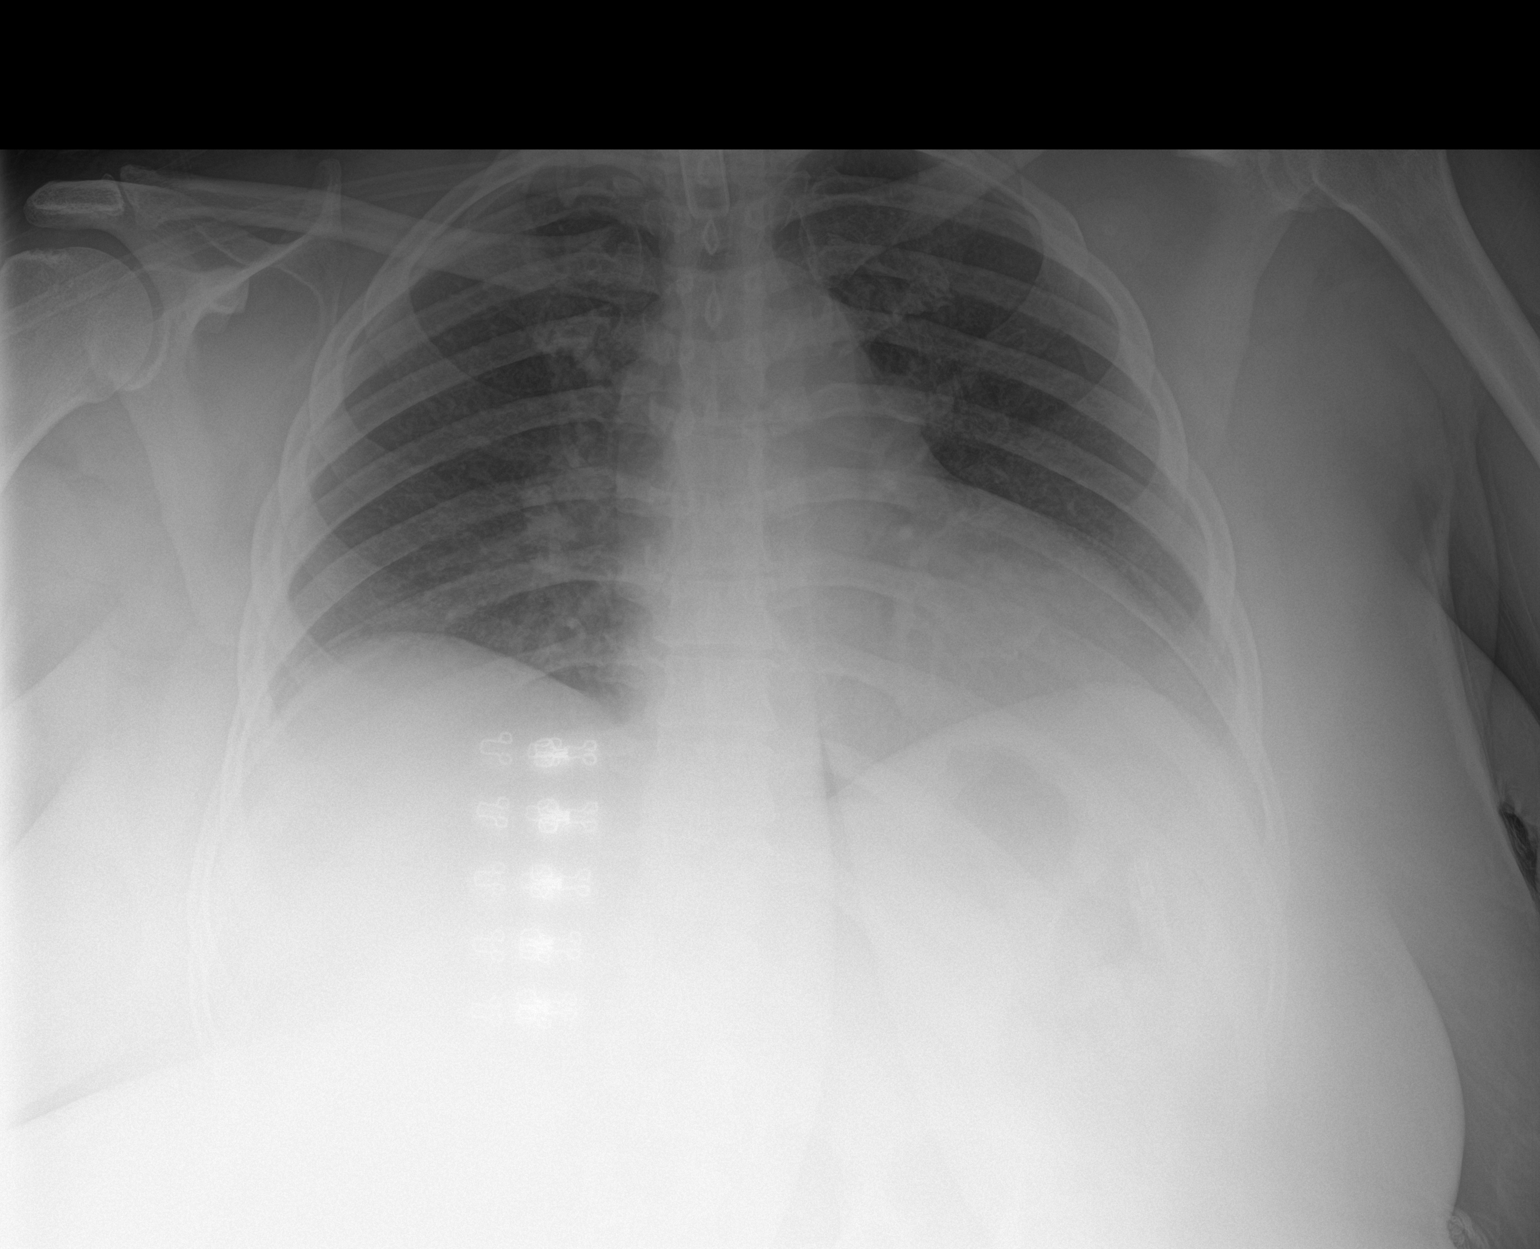

[1 of 1 positions shown; findings below may reference images not displayed]

FINDINGS: Unchanged tracheostomy tube with the tip at the thoracic inlet. The
cardiomediastinal silhouette is at the upper limits of normal in
size. Low lung volumes are present, causing crowding of the
pulmonary vasculature. No focal consolidation, pleural effusion, or
pneumothorax. No acute osseous abnormality.
IMPRESSION: No active disease.

## 2017-12-31 IMAGING — CT CT ANGIO CHEST
2 of 16 series · 15 of 36 positions shown · IV contrast (APPLIED)
Comparison: Chest x-ray 06/02/2017.  Chest CT 05/06/2017

CLINICAL DATA: Short of breath chest tightness.  Suspect PE

EXAM:
CT ANGIOGRAPHY CHEST WITH CONTRAST
TECHNIQUE: Multidetector CT imaging of the chest was performed using the
standard protocol during bolus administration of intravenous
contrast. Multiplanar CT image reconstructions and MIPs were
obtained to evaluate the vascular anatomy.
CONTRAST:  80 mL Isovue 370 IV

[Series 14: thins · axial · 0.68mm/px · z∈[+1153,+1378]mm · 14 of 261 slices shown]
[im 18/261  lung]
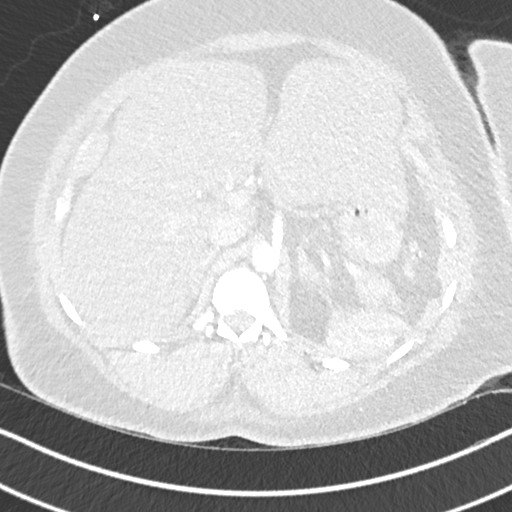
[im 35/261  mediastinal]
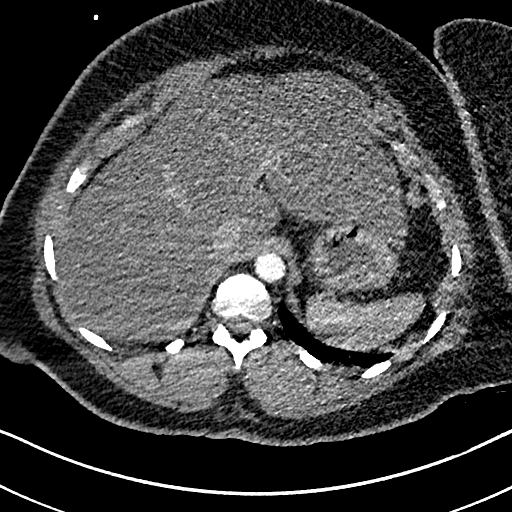
[im 53/261  lung]
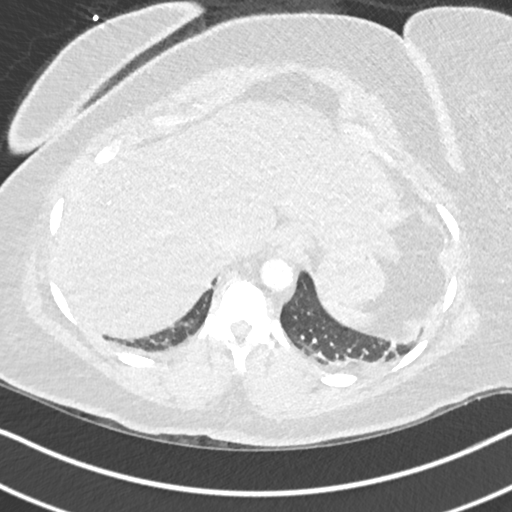
[im 70/261  mediastinal]
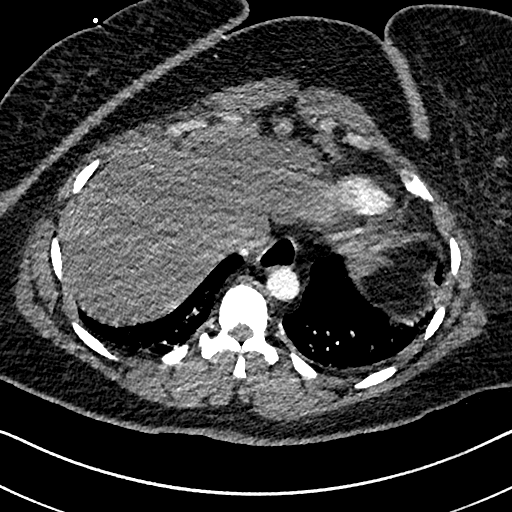
[im 87/261  lung]
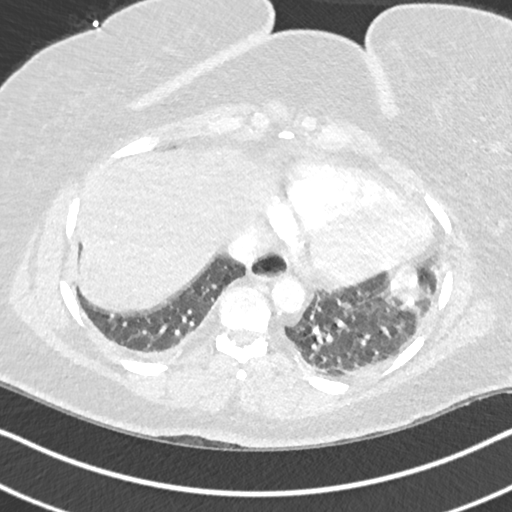
[im 105/261  mediastinal]
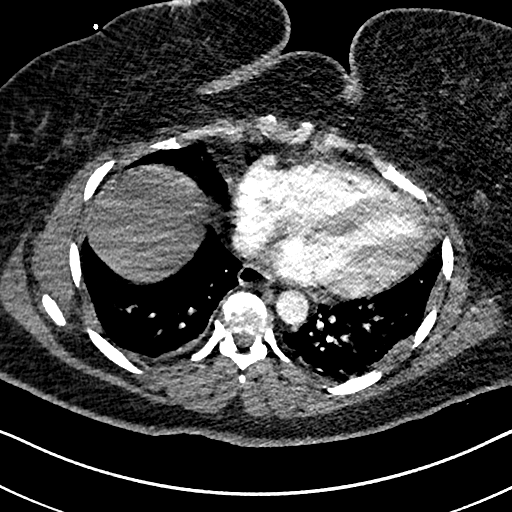
[im 122/261  lung]
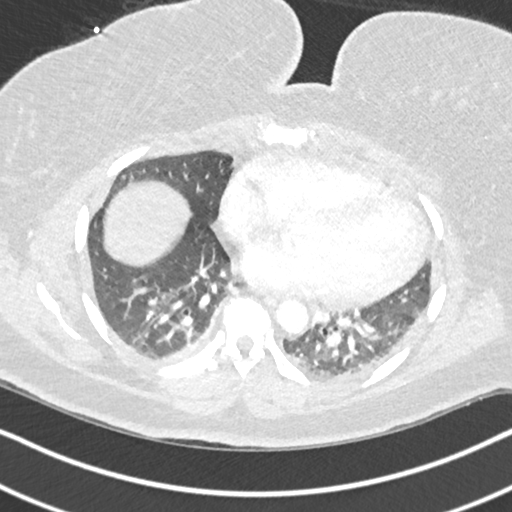
[im 139/261  mediastinal]
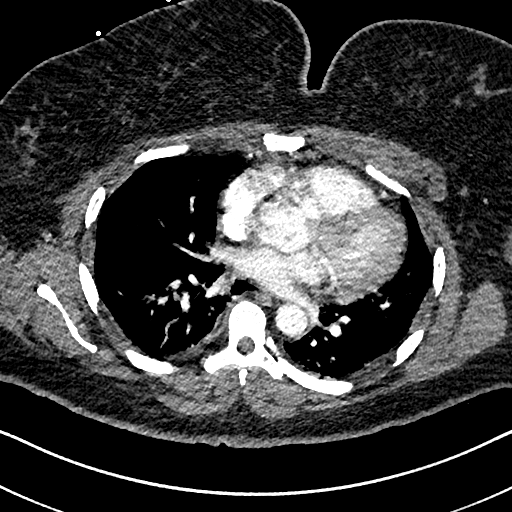
[im 157/261  lung]
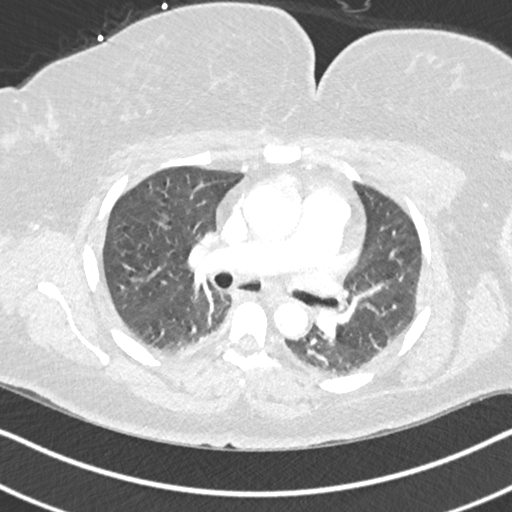
[im 174/261  mediastinal]
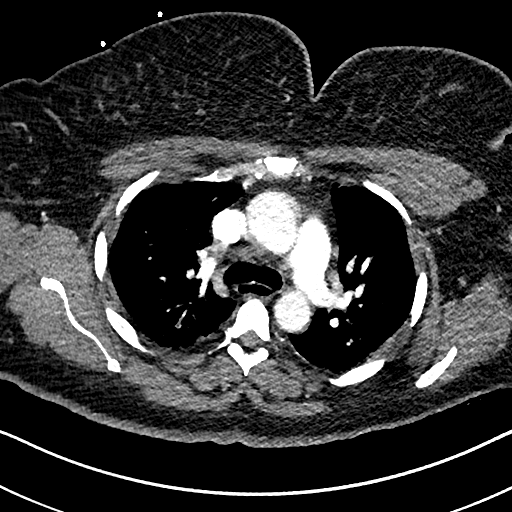
[im 191/261  lung]
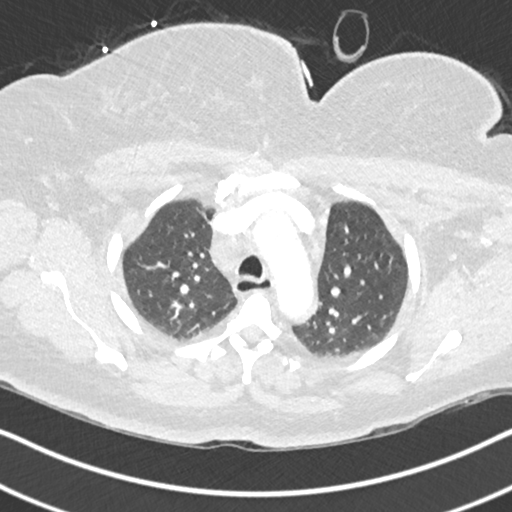
[im 209/261  mediastinal]
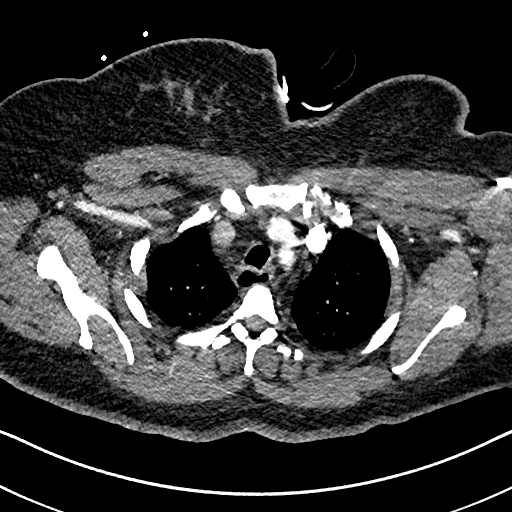
[im 226/261  lung]
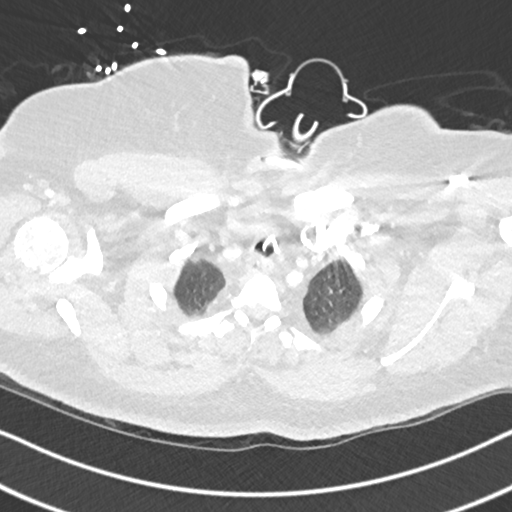
[im 243/261  mediastinal]
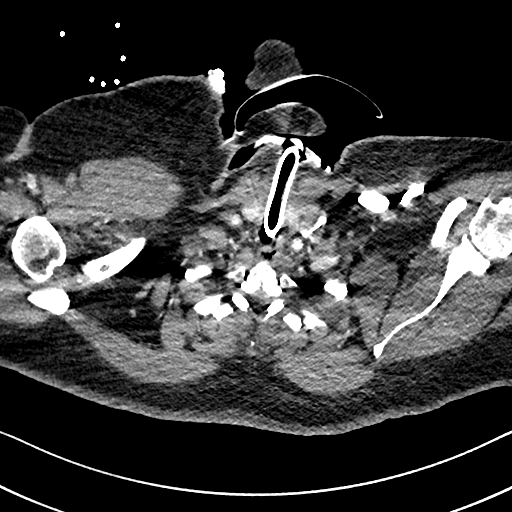

[Series 16: coronal mpr · coronal · 0.53mm/px · 1 of 132 slices shown]
[im 66/132  mediastinal]
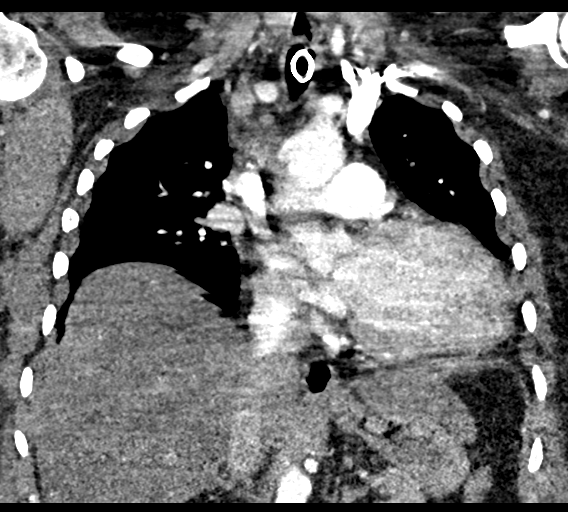

[15 of 36 positions shown; findings below may reference images not displayed]

FINDINGS: Image quality degraded by patient motion and large patient size.

Cardiovascular: Negative for pulmonary embolism. Negative for aortic
aneurysm or dissection mild cardiac enlargement.

Mediastinum/Nodes: Right lower peritracheal lymph node 19 mm
unchanged from the prior study. Otherwise no adenopathy.
Tracheostomy tube in place.

Lungs/Pleura: Mild atelectasis in the lung bases. Negative for
pneumonia or effusion

Upper Abdomen: Fatty infiltration of the liver.

Musculoskeletal: No acute skeletal abnormality

Review of the MIP images confirms the above findings.
IMPRESSION: Negative for pulmonary embolism.

Mild bibasilar atelectasis

19 mm right lower peritracheal lymph node unchanged from the recent
study.

## 2018-01-14 IMAGING — DX DG CHEST 1V PORT
2 series · 2 of 2 positions shown · non-contrast
Comparison: CT 06/02/2017, radiograph 06/02/2017

CLINICAL DATA: Shortness of breath

EXAM:
PORTABLE CHEST 1 VIEW

[chest ap (1 of 2)]
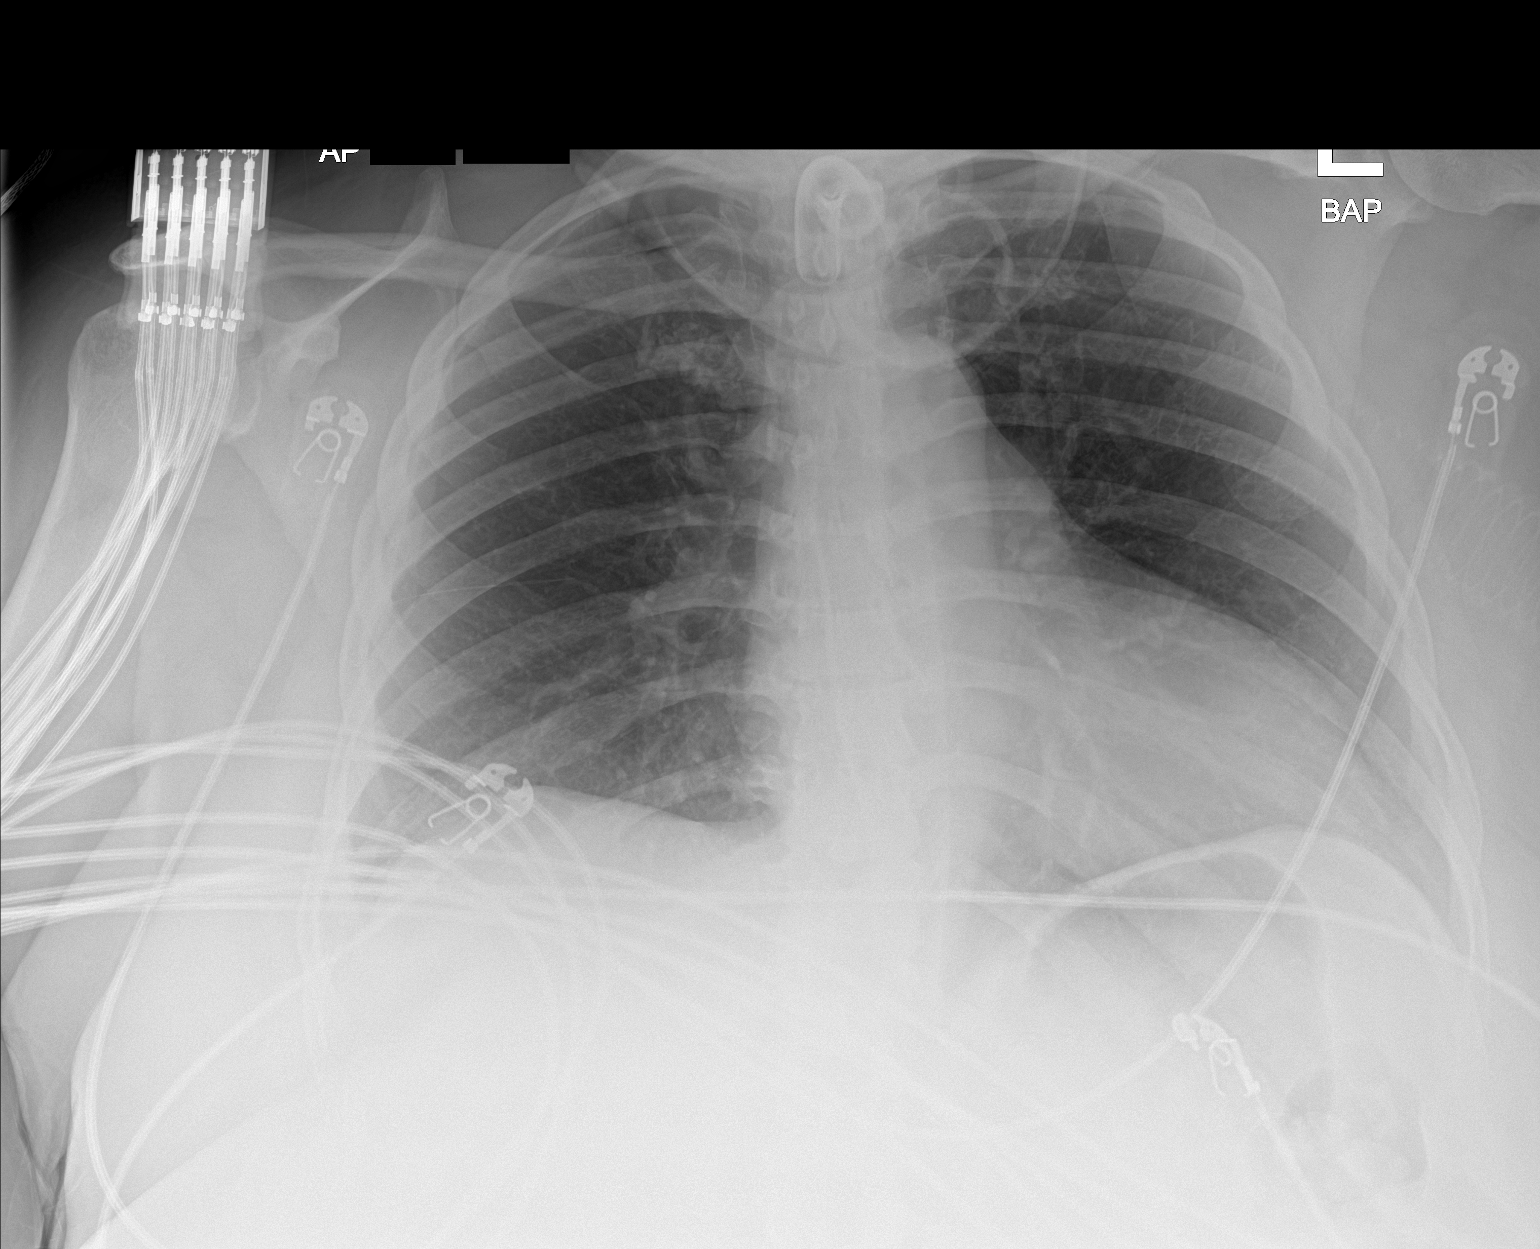

[chest ap (2 of 2)]
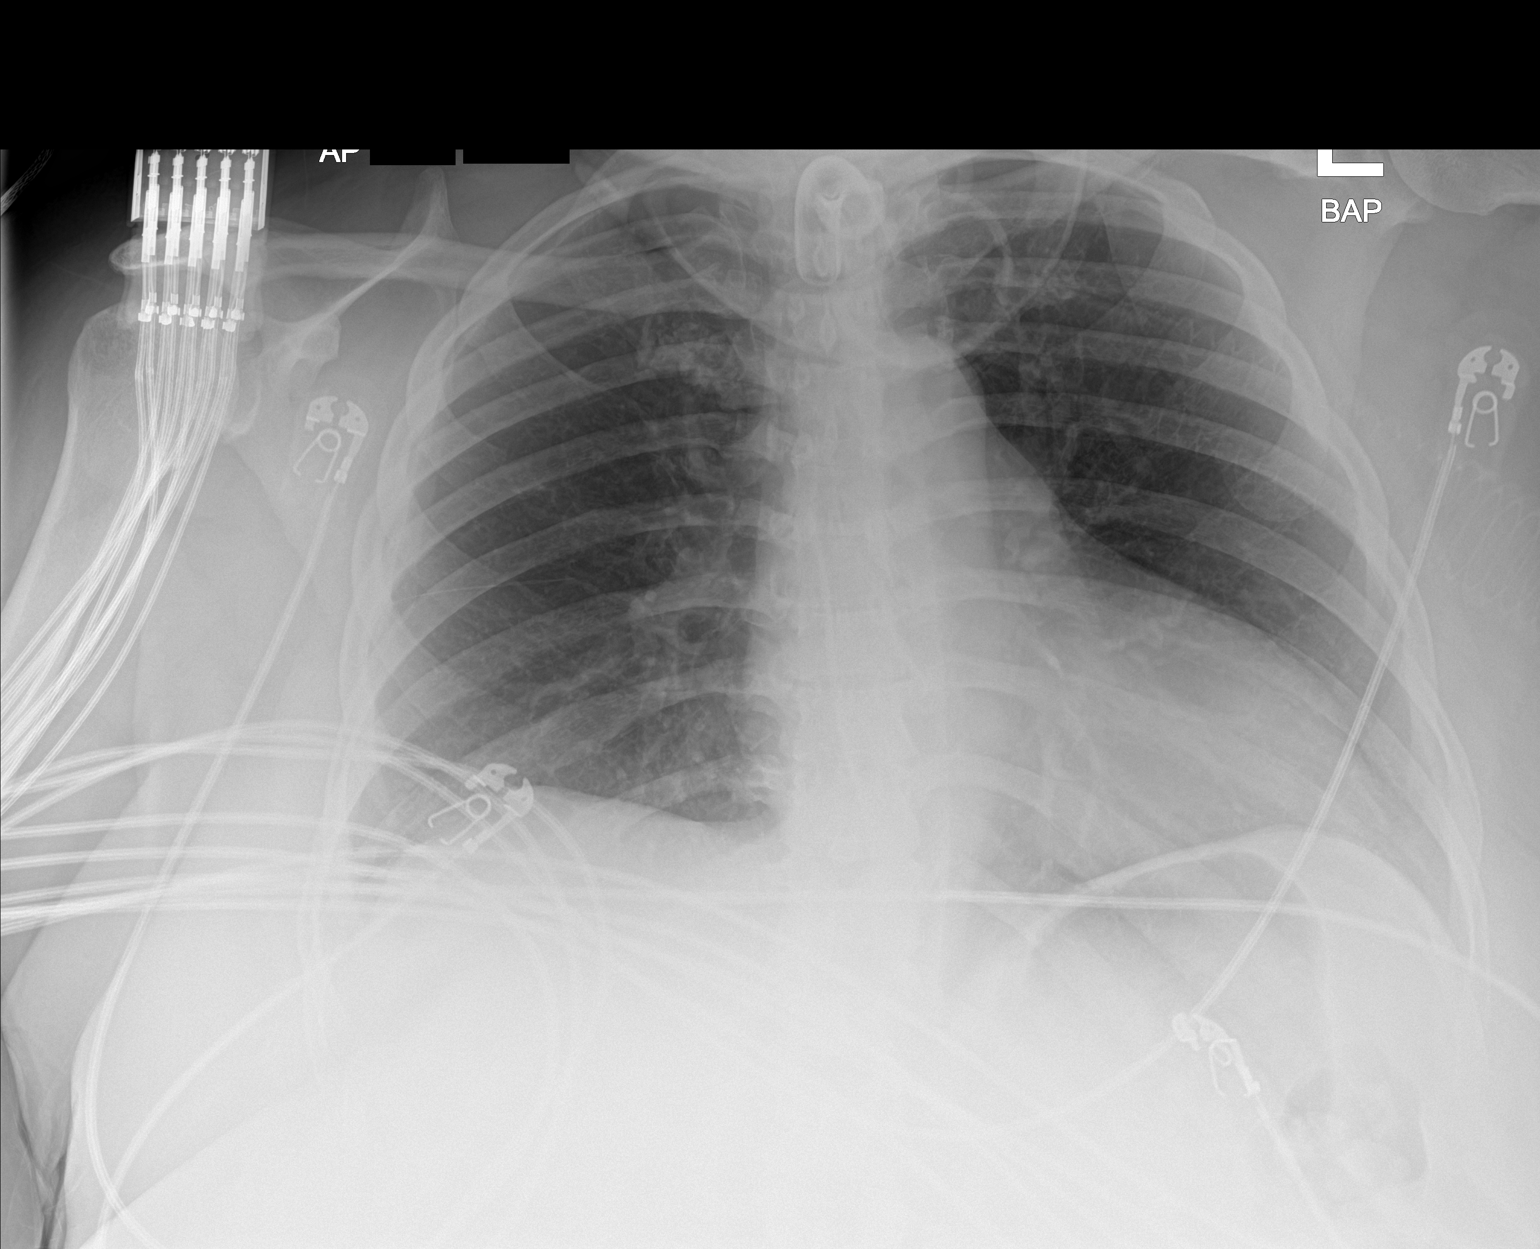

[2 of 2 positions shown; findings below may reference images not displayed]

FINDINGS: Tracheostomy tube is in place. No focal pulmonary infiltrate or
effusion. Stable cardiomediastinal silhouette. No pneumothorax.
IMPRESSION: No active disease.

## 2018-01-18 ENCOUNTER — Encounter: Payer: Self-pay | Admitting: Family Medicine

## 2018-01-18 ENCOUNTER — Ambulatory Visit: Payer: Medicare Other | Attending: Family Medicine | Admitting: Family Medicine

## 2018-01-18 VITALS — BP 163/103 | HR 93 | Temp 98.6°F | Ht 61.0 in | Wt 242.2 lb

## 2018-01-18 DIAGNOSIS — E1142 Type 2 diabetes mellitus with diabetic polyneuropathy: Secondary | ICD-10-CM

## 2018-01-18 DIAGNOSIS — Z79899 Other long term (current) drug therapy: Secondary | ICD-10-CM | POA: Insufficient documentation

## 2018-01-18 DIAGNOSIS — R05 Cough: Secondary | ICD-10-CM | POA: Diagnosis not present

## 2018-01-18 DIAGNOSIS — J45901 Unspecified asthma with (acute) exacerbation: Secondary | ICD-10-CM | POA: Diagnosis not present

## 2018-01-18 DIAGNOSIS — G5603 Carpal tunnel syndrome, bilateral upper limbs: Secondary | ICD-10-CM | POA: Diagnosis not present

## 2018-01-18 DIAGNOSIS — G43119 Migraine with aura, intractable, without status migrainosus: Secondary | ICD-10-CM

## 2018-01-18 DIAGNOSIS — M545 Low back pain, unspecified: Secondary | ICD-10-CM

## 2018-01-18 DIAGNOSIS — G8929 Other chronic pain: Secondary | ICD-10-CM | POA: Insufficient documentation

## 2018-01-18 DIAGNOSIS — J4530 Mild persistent asthma, uncomplicated: Secondary | ICD-10-CM | POA: Diagnosis not present

## 2018-01-18 DIAGNOSIS — E7800 Pure hypercholesterolemia, unspecified: Secondary | ICD-10-CM | POA: Insufficient documentation

## 2018-01-18 DIAGNOSIS — G5600 Carpal tunnel syndrome, unspecified upper limb: Secondary | ICD-10-CM | POA: Insufficient documentation

## 2018-01-18 DIAGNOSIS — I10 Essential (primary) hypertension: Secondary | ICD-10-CM | POA: Diagnosis not present

## 2018-01-18 DIAGNOSIS — R059 Cough, unspecified: Secondary | ICD-10-CM

## 2018-01-18 DIAGNOSIS — Z9049 Acquired absence of other specified parts of digestive tract: Secondary | ICD-10-CM | POA: Diagnosis not present

## 2018-01-18 DIAGNOSIS — Z9889 Other specified postprocedural states: Secondary | ICD-10-CM | POA: Diagnosis not present

## 2018-01-18 DIAGNOSIS — Z930 Tracheostomy status: Secondary | ICD-10-CM | POA: Diagnosis not present

## 2018-01-18 DIAGNOSIS — Z794 Long term (current) use of insulin: Secondary | ICD-10-CM | POA: Diagnosis not present

## 2018-01-18 LAB — GLUCOSE, POCT (MANUAL RESULT ENTRY): POC GLUCOSE: 431 mg/dL — AB (ref 70–99)

## 2018-01-18 MED ORDER — GABAPENTIN 300 MG PO CAPS: 600.0000 mg | ORAL_CAPSULE | Freq: Two times a day (BID) | ORAL | 1 refills | Status: DC

## 2018-01-18 MED ORDER — SUMATRIPTAN SUCCINATE 100 MG PO TABS: ORAL_TABLET | ORAL | 1 refills | Status: DC

## 2018-01-18 MED ORDER — ATORVASTATIN CALCIUM 20 MG PO TABS: 20.0000 mg | ORAL_TABLET | Freq: Every day | ORAL | 1 refills | Status: DC

## 2018-01-18 MED ORDER — TIZANIDINE HCL 4 MG PO TABS: 4.0000 mg | ORAL_TABLET | Freq: Three times a day (TID) | ORAL | 3 refills | Status: DC | PRN

## 2018-01-18 MED ORDER — TOPIRAMATE 100 MG PO TABS: 100.0000 mg | ORAL_TABLET | Freq: Two times a day (BID) | ORAL | 1 refills | Status: DC

## 2018-01-18 MED ORDER — CARVEDILOL 12.5 MG PO TABS: 12.5000 mg | ORAL_TABLET | Freq: Two times a day (BID) | ORAL | 1 refills | Status: DC

## 2018-01-18 MED ORDER — PANTOPRAZOLE SODIUM 40 MG PO TBEC: 40.0000 mg | DELAYED_RELEASE_TABLET | Freq: Every day | ORAL | 1 refills | Status: DC

## 2018-01-18 MED ORDER — ACETAMINOPHEN-CODEINE #3 300-30 MG PO TABS: 1.0000 | ORAL_TABLET | Freq: Every evening | ORAL | 1 refills | Status: DC | PRN

## 2018-01-18 MED ORDER — ALBUTEROL SULFATE HFA 108 (90 BASE) MCG/ACT IN AERS: 2.0000 | INHALATION_SPRAY | Freq: Four times a day (QID) | RESPIRATORY_TRACT | 3 refills | Status: DC | PRN

## 2018-01-18 MED ORDER — MONTELUKAST SODIUM 10 MG PO TABS: 10.0000 mg | ORAL_TABLET | Freq: Every day | ORAL | 1 refills | Status: DC

## 2018-01-18 MED ORDER — HYDROCODONE-HOMATROPINE 5-1.5 MG/5ML PO SYRP: 5.0000 mL | ORAL_SOLUTION | Freq: Four times a day (QID) | ORAL | 0 refills | Status: DC | PRN

## 2018-01-18 MED ORDER — AMLODIPINE BESYLATE 10 MG PO TABS: 10.0000 mg | ORAL_TABLET | Freq: Every day | ORAL | 1 refills | Status: DC

## 2018-01-18 MED ORDER — ALBUTEROL SULFATE (2.5 MG/3ML) 0.083% IN NEBU: 2.5000 mg | INHALATION_SOLUTION | Freq: Four times a day (QID) | RESPIRATORY_TRACT | 3 refills | Status: DC | PRN

## 2018-01-18 MED ORDER — BUDESONIDE-FORMOTEROL FUMARATE 160-4.5 MCG/ACT IN AERO: 2.0000 | INHALATION_SPRAY | Freq: Two times a day (BID) | RESPIRATORY_TRACT | 1 refills | Status: DC

## 2018-01-18 NOTE — Progress Notes (Signed)
Subjective:  Patient ID: [name], female    DOB: 1970-11-05  Age: 47 y.o. MRN: [id number]  CC: Diabetes; Hypertension; and Medication Refill   [name]  is a 47 year old female with a history of type 2 diabetes mellitus (A1c 11.8), hypertension, obstructive sleep apnea, asthma, depression, s/p tracheostomy secondary to intubation related tracheal injury, subglottic stenosis (status post balloon dilatation and Kenalog injection of stenosis tracheostomy tube exchange by ENT at [facility]) here for follow-up visit. She continues to complain of cough with associated pain at the site of her tracheostomy tube and feels like she has ulcers around her tube.  She has run out of her hycodan syrup which did help with her symptoms and is requesting Tylenol 3 for pain. Reviewed most recent ENT notes from 12/24/2017 which revealed she had expulsion of the tracheostomy tube on an occasion after severe coughing but was able to place it back.  Plans for tracheostomy tube change in 01/2018.  She has also been wheezing and has associated shortness of breath ever since she she ran out of her inhalers for asthma and is requesting refills today.  Her A1c is 11.8 and she endorses compliance with her medications; she is scheduled to see her endocrinologist in the next 1 week. She suffers from diabetic neuropathy in her feet, denies visual concerns but has not had a recent eye exam. She complains of numbness and tingling in both hands with associated cramping which can last up to 30 minutes.  She has a previous history of carpal tunnel release surgery in the past. Her migraine has been stable.  Past Medical History:  Diagnosis Date  . Arthritis   . Asthma   . Chronic back pain   . Chronic chest pain   . Depression   . DM (diabetes mellitus) ([place])    INSULIN DEPENDENT  . GERD (gastroesophageal reflux disease)   . Headache(784.0)   . Hypertension   . Hypokalemia   .  Respiratory disease 05/2017  . Tracheostomy in place [facility] 02/2017    Past Surgical History:  Procedure Laterality Date  . APPENDECTOMY    . CESAREAN SECTION     x 3  . CHOLECYSTECTOMY N/A 03/02/2014   Procedure: LAPAROSCOPIC CHOLECYSTECTOMY;  Surgeon: Joyice Faster. Cornett, MD;  Location: [place];  Service: General;  Laterality: N/A;  . HERNIA REPAIR    . PANENDOSCOPY N/A 03/04/2017   Procedure: PANENDOSCOPY WITH POSSIBLE FOREIGN BODY REMOVAL;  Surgeon: Jodi Marble, MD;  Location: [place];  Service: ENT;  Laterality: N/A;  . ROTATOR CUFF REPAIR    . TRACHEOSTOMY  02/2017  . VESICOVAGINAL FISTULA CLOSURE W/ TAH  2009    Allergies  Allergen Reactions  . Reglan [Metoclopramide] Other (See Comments)    Panic attack      Outpatient Medications Prior to Visit  Medication Sig Dispense Refill  . ACCU-CHEK SOFTCLIX LANCETS lancets Use as instructed to check blood sugar once daily. E11.9 100 each 12  . Artificial Tear Ointment (ARTIFICIAL TEARS) ointment Place 1 application into both eyes at bedtime.     . Blood Glucose Monitoring Suppl (ACCU-CHEK AVIVA PLUS) w/Device KIT Used to check blood sugars once daily. E11.9 1 kit 0  . Insulin Glargine (BASAGLAR KWIKPEN) 100 UNIT/ML SOPN Inject 1 mL (100 Units total) into the skin every morning. And pen needles 1/day 15 pen 11  . Insulin Pen Needle 31G X 5 MM MISC 1 each by Does not apply route at bedtime. 30 each  5  . sertraline (ZOLOFT) 100 MG tablet Place 1 tablet (100 mg total) into feeding tube daily. 30 tablet 5  . albuterol (PROAIR HFA) 108 (90 Base) MCG/ACT inhaler Inhale 2 puffs into the lungs every 6 (six) hours as needed for wheezing or shortness of breath. 1 Inhaler 3  . albuterol (PROVENTIL) (2.5 MG/3ML) 0.083% nebulizer solution Take 3 mLs (2.5 mg total) by nebulization every 6 (six) hours as needed for wheezing or shortness of breath. Reported on 02/22/2016 75 mL 3  . amLODipine (NORVASC) 10 MG tablet Take 1 tablet (10 mg total) by mouth  daily. 90 tablet 0  . atorvastatin (LIPITOR) 20 MG tablet Take 1 tablet (20 mg total) by mouth daily. 90 tablet 0  . benzonatate (TESSALON) 100 MG capsule Take 1 capsule (100 mg total) by mouth every 8 (eight) hours. 60 capsule 0  . budesonide-formoterol (SYMBICORT) 160-4.5 MCG/ACT inhaler Inhale 2 puffs into the lungs 2 (two) times daily. 1 Inhaler 3  . carvedilol (COREG) 12.5 MG tablet Take 1 tablet (12.5 mg total) by mouth 2 (two) times daily with a meal. 60 tablet 3  . gabapentin (NEURONTIN) 300 MG capsule Take 1 capsule (300 mg total) by mouth 3 (three) times daily. 90 capsule 5  . HYDROcodone-homatropine (HYCODAN) 5-1.5 MG/5ML syrup Take 5 mLs by mouth every 6 (six) hours as needed for cough. 120 mL 0  . montelukast (SINGULAIR) 10 MG tablet Take 1 tablet (10 mg total) by mouth at bedtime. 30 tablet 3  . pantoprazole (PROTONIX) 40 MG tablet Take 1 tablet (40 mg total) by mouth daily. 90 tablet 1  . SUMAtriptan (IMITREX) 100 MG tablet At the onset of a migraine; may repeat in 2 hours. Max daily dose 234m 20 tablet 1  . tiZANidine (ZANAFLEX) 4 MG tablet TAKE 1 TABLET BY MOUTH EVERY 8 HOURS AS NEEDED FOR MUSCLE SPASM 180 tablet 1  . topiramate (TOPAMAX) 100 MG tablet Take 1 tablet (100 mg total) by mouth 2 (two) times daily. 180 tablet 0  . chlorhexidine gluconate, MEDLINE KIT, (PERIDEX) 0.12 % solution 15 mLs by Mouth Rinse route 2 (two) times daily. (Patient not taking: Reported on 10/15/2017) 120 mL 0  . glucose blood (ACCU-CHEK AVIVA) test strip Use as directed to check blood sugars once daily. E11.9 (Patient not taking: Reported on 06/12/2017) 100 each 12  . sodium chloride HYPERTONIC 3 % nebulizer solution Take 4 mLs by nebulization 2 (two) times daily. (Patient not taking: Reported on 06/12/2017) 750 mL 0   No facility-administered medications prior to visit.     ROS Review of Systems  Constitutional: Negative for activity change, appetite change and fatigue.  HENT: Negative for  congestion, sinus pressure and sore throat.   Eyes: Negative for visual disturbance.  Respiratory: Positive for cough, shortness of breath and wheezing. Negative for chest tightness.   Cardiovascular: Negative for chest pain and palpitations.  Gastrointestinal: Negative for abdominal distention, abdominal pain and constipation.  Endocrine: Negative for polydipsia.  Genitourinary: Negative for dysuria and frequency.  Musculoskeletal: Positive for neck pain. Negative for arthralgias and back pain.  Skin: Negative for rash.  Neurological: Positive for numbness. Negative for tremors and light-headedness.  Hematological: Does not bruise/bleed easily.  Psychiatric/Behavioral: Negative for agitation and behavioral problems.    Objective:  BP (!) 163/103   Pulse 93   Temp 98.6 F (37 C) (Oral)   Ht _0  (1.549 m)   Wt 242 lb 3.2 oz (109.9 kg)   SpO2 93%  BMI 45.76 kg/m   BP/Weight 01/18/2018 11/27/2017 1/57/2620  Systolic BP 355 974 163  Diastolic BP 845 82 90  Wt. (Lbs) 242.2 241.6 230.4  BMI 45.76 45.65 43.53      Physical Exam  Constitutional: She is oriented to person, place, and time. She appears well-developed and well-nourished.  Neck:  Tracheostomy tube in place  Cardiovascular: Normal rate, normal heart sounds and intact distal pulses.  No murmur heard. Pulmonary/Chest: Effort normal and breath sounds normal. She has no wheezes. She has no rales. She exhibits no tenderness.  Abdominal: Soft. Bowel sounds are normal. She exhibits no distension and no mass. There is no tenderness.  Musculoskeletal: Normal range of motion.  Neurological: She is alert and oriented to person, place, and time.  Skin: Skin is warm and dry.  Psychiatric: She has a normal mood and affect.     CMP Latest Ref Rng & Units 07/02/2017 06/16/2017 06/09/2017  Glucose 65 - 99 mg/dL 322(H) 198(H) 228(H)  BUN 6 - 20 mg/dL _0 Creatinine 0.44 - 1.00 mg/dL 1.20(H) 0.96 1.25(H)  Sodium 135 - 145  mmol/L 137 139 136  Potassium 3.5 - 5.1 mmol/L 3.8 3.4(L) 3.5  Chloride 101 - 111 mmol/L 106 112(H) 107  CO2 22 - 32 mmol/L 20(L) 19(L) 25  Calcium 8.9 - 10.3 mg/dL 9.3 8.9 8.3(L)  Total Protein 6.5 - 8.1 g/dL 7.2 - -  Total Bilirubin 0.3 - 1.2 mg/dL 0.5 - -  Alkaline Phos 38 - 126 U/L 94 - -  AST 15 - 41 U/L 17 - -  ALT 14 - 54 U/L 18 - -    Lipid Panel     Component Value Date/Time   CHOL 175 01/05/2017 1025   TRIG 190 (H) 03/04/2017 1414   HDL 48 01/05/2017 1025   CHOLHDL 3.6 01/05/2017 1025   CHOLHDL 3.9 02/22/2016 0954   VLDL 26 02/22/2016 0954   LDLCALC 101 (H) 01/05/2017 1025     Lab Results  Component Value Date   HGBA1C 11.8 11/27/2017    Assessment & Plan:   1. Type 2 diabetes mellitus with diabetic polyneuropathy, without long-term current use of insulin (HCC) Uncontrolled with A1c of 11.8 She is scheduled to see her endocrinologist next week and I will make no regimen changes today Diabetic diet, lifestyle modifications - POCT glucose (manual entry) - CMP14+EGFR; Future - Lipid panel; Future - Microalbumin/Creatinine Ratio, Urine; Future - Ambulatory referral to Ophthalmology  2. Chronic bilateral low back pain without sciatica Stable - tiZANidine (ZANAFLEX) 4 MG tablet; Take 1 tablet (4 mg total) by mouth every 8 (eight) hours as needed for muscle spasms.  Dispense: 90 tablet; Refill: 3  3. Stable asthma, mild persistent Recent exacerbation due to running out of inhalers which I have refilled - albuterol (PROVENTIL) (2.5 MG/3ML) 0.083% nebulizer solution; Take 3 mLs (2.5 mg total) by nebulization every 6 (six) hours as needed for wheezing or shortness of breath.  Dispense: 75 mL; Refill: 3  4. Benign essential HTN Uncontrolled due to running out of medications Low sodium, DASH diet Consider adding ACE inhibitor/ARB if she has microalbuminuria - amLODipine (NORVASC) 10 MG tablet; Take 1 tablet (10 mg total) by mouth daily.  Dispense: 90 tablet;  Refill: 1 - carvedilol (COREG) 12.5 MG tablet; Take 1 tablet (12.5 mg total) by mouth 2 (two) times daily with a meal.  Dispense: 180 tablet; Refill: 1  5. Pure hypercholesterolemia Controlled Low-cholesterol diet - atorvastatin (LIPITOR) 20 MG tablet;  Take 1 tablet (20 mg total) by mouth daily.  Dispense: 90 tablet; Refill: 1  6. Cough Secondary to laryngeal stenosis - HYDROcodone-homatropine (HYCODAN) 5-1.5 MG/5ML syrup; Take 5 mLs by mouth every 6 (six) hours as needed for cough.  Dispense: 473 mL; Refill: 0  7. Severe asthma with acute exacerbation, unspecified whether persistent - montelukast (SINGULAIR) 10 MG tablet; Take 1 tablet (10 mg total) by mouth at bedtime.  Dispense: 90 tablet; Refill: 1  8. Intractable migraine with aura without status migrainosus Stable - SUMAtriptan (IMITREX) 100 MG tablet; At the onset of a migraine; may repeat in 2 hours. Max daily dose 285m  Dispense: 20 tablet; Refill: 1 - topiramate (TOPAMAX) 100 MG tablet; Take 1 tablet (100 mg total) by mouth 2 (two) times daily.  Dispense: 180 tablet; Refill: 1  9. [place] With persisting pain around tracheostomy site with associated cough Advised to follow-up with ENT - acetaminophen-codeine (TYLENOL #3) 300-30 MG tablet; Take 1 tablet by mouth at bedtime as needed for moderate pain.  Dispense: 30 tablet; Refill: 1 - HYDROcodone-homatropine (HYCODAN) 5-1.5 MG/5ML syrup; Take 5 mLs by mouth every 6 (six) hours as needed for cough.  Dispense: 473 mL; Refill: 0  10. Bilateral carpal tunnel syndrome Increased dose of gabapentin Hopefully refilling tizanidine will help Advised to use bilateral wrist braces   Meds ordered this encounter  Medications  . gabapentin (NEURONTIN) 300 MG capsule    Sig: Take 2 capsules (600 mg total) by mouth 2 (two) times daily.    Dispense:  360 capsule    Refill:  1    Discontinue previous dose  . tiZANidine (ZANAFLEX) 4 MG tablet    Sig: Take 1 tablet (4  mg total) by mouth every 8 (eight) hours as needed for muscle spasms.    Dispense:  90 tablet    Refill:  3    Please consider 90 day supplies to promote better adherence  . acetaminophen-codeine (TYLENOL #3) 300-30 MG tablet    Sig: Take 1 tablet by mouth at bedtime as needed for moderate pain.    Dispense:  30 tablet    Refill:  1  . albuterol (PROAIR HFA) 108 (90 Base) MCG/ACT inhaler    Sig: Inhale 2 puffs into the lungs every 6 (six) hours as needed for wheezing or shortness of breath.    Dispense:  1 Inhaler    Refill:  3  . albuterol (PROVENTIL) (2.5 MG/3ML) 0.083% nebulizer solution    Sig: Take 3 mLs (2.5 mg total) by nebulization every 6 (six) hours as needed for wheezing or shortness of breath.    Dispense:  75 mL    Refill:  3  . amLODipine (NORVASC) 10 MG tablet    Sig: Take 1 tablet (10 mg total) by mouth daily.    Dispense:  90 tablet    Refill:  1  . atorvastatin (LIPITOR) 20 MG tablet    Sig: Take 1 tablet (20 mg total) by mouth daily.    Dispense:  90 tablet    Refill:  1    Please d/c previous atorvastatin scripts  . budesonide-formoterol (SYMBICORT) 160-4.5 MCG/ACT inhaler    Sig: Inhale 2 puffs into the lungs 2 (two) times daily.    Dispense:  3 Inhaler    Refill:  1  . carvedilol (COREG) 12.5 MG tablet    Sig: Take 1 tablet (12.5 mg total) by mouth 2 (two) times daily with a meal.    Dispense:  180 tablet    Refill:  1  . HYDROcodone-homatropine (HYCODAN) 5-1.5 MG/5ML syrup    Sig: Take 5 mLs by mouth every 6 (six) hours as needed for cough.    Dispense:  473 mL    Refill:  0  . montelukast (SINGULAIR) 10 MG tablet    Sig: Take 1 tablet (10 mg total) by mouth at bedtime.    Dispense:  90 tablet    Refill:  1  . pantoprazole (PROTONIX) 40 MG tablet    Sig: Take 1 tablet (40 mg total) by mouth daily.    Dispense:  90 tablet    Refill:  1  . SUMAtriptan (IMITREX) 100 MG tablet    Sig: At the onset of a migraine; may repeat in 2 hours. Max daily dose  299m    Dispense:  20 tablet    Refill:  1  . topiramate (TOPAMAX) 100 MG tablet    Sig: Take 1 tablet (100 mg total) by mouth 2 (two) times daily.    Dispense:  180 tablet    Refill:  1    Please d/c previous topiramate scripts    Follow-up: Return in about 1 month (around 02/17/2018) for complete physical exam.   ECharlott RakesMD

## 2018-01-18 NOTE — Progress Notes (Signed)
Patient needs refill on all medications. 

## 2018-01-30 IMAGING — DX DG CHEST 2V
2 series · 2 of 2 positions shown · non-contrast
Comparison: 06/16/2017 and earlier.

CLINICAL DATA: 45-year-old female with productive cough headache
and hypertension.

EXAM:
CHEST  2 VIEW

[w chest lat]
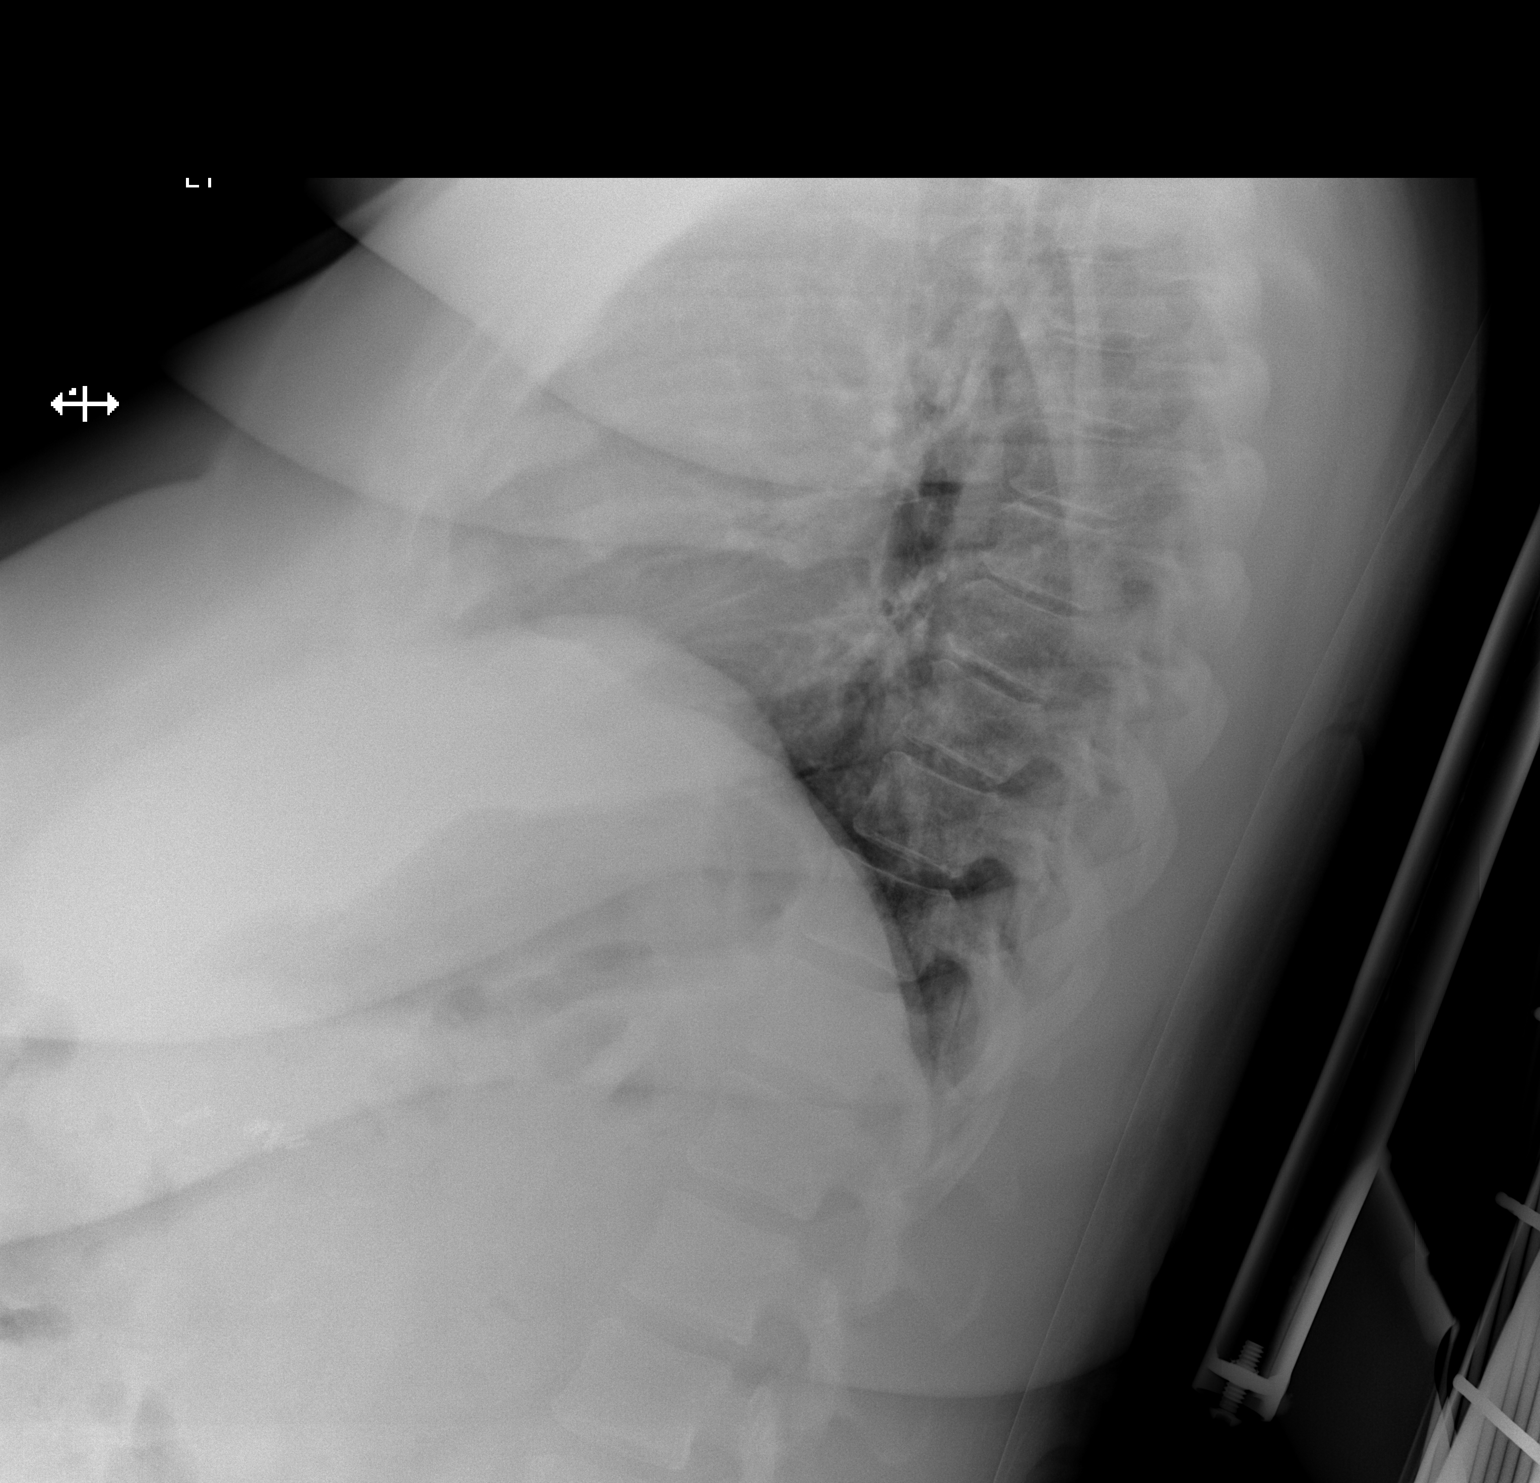

[x chest ap]
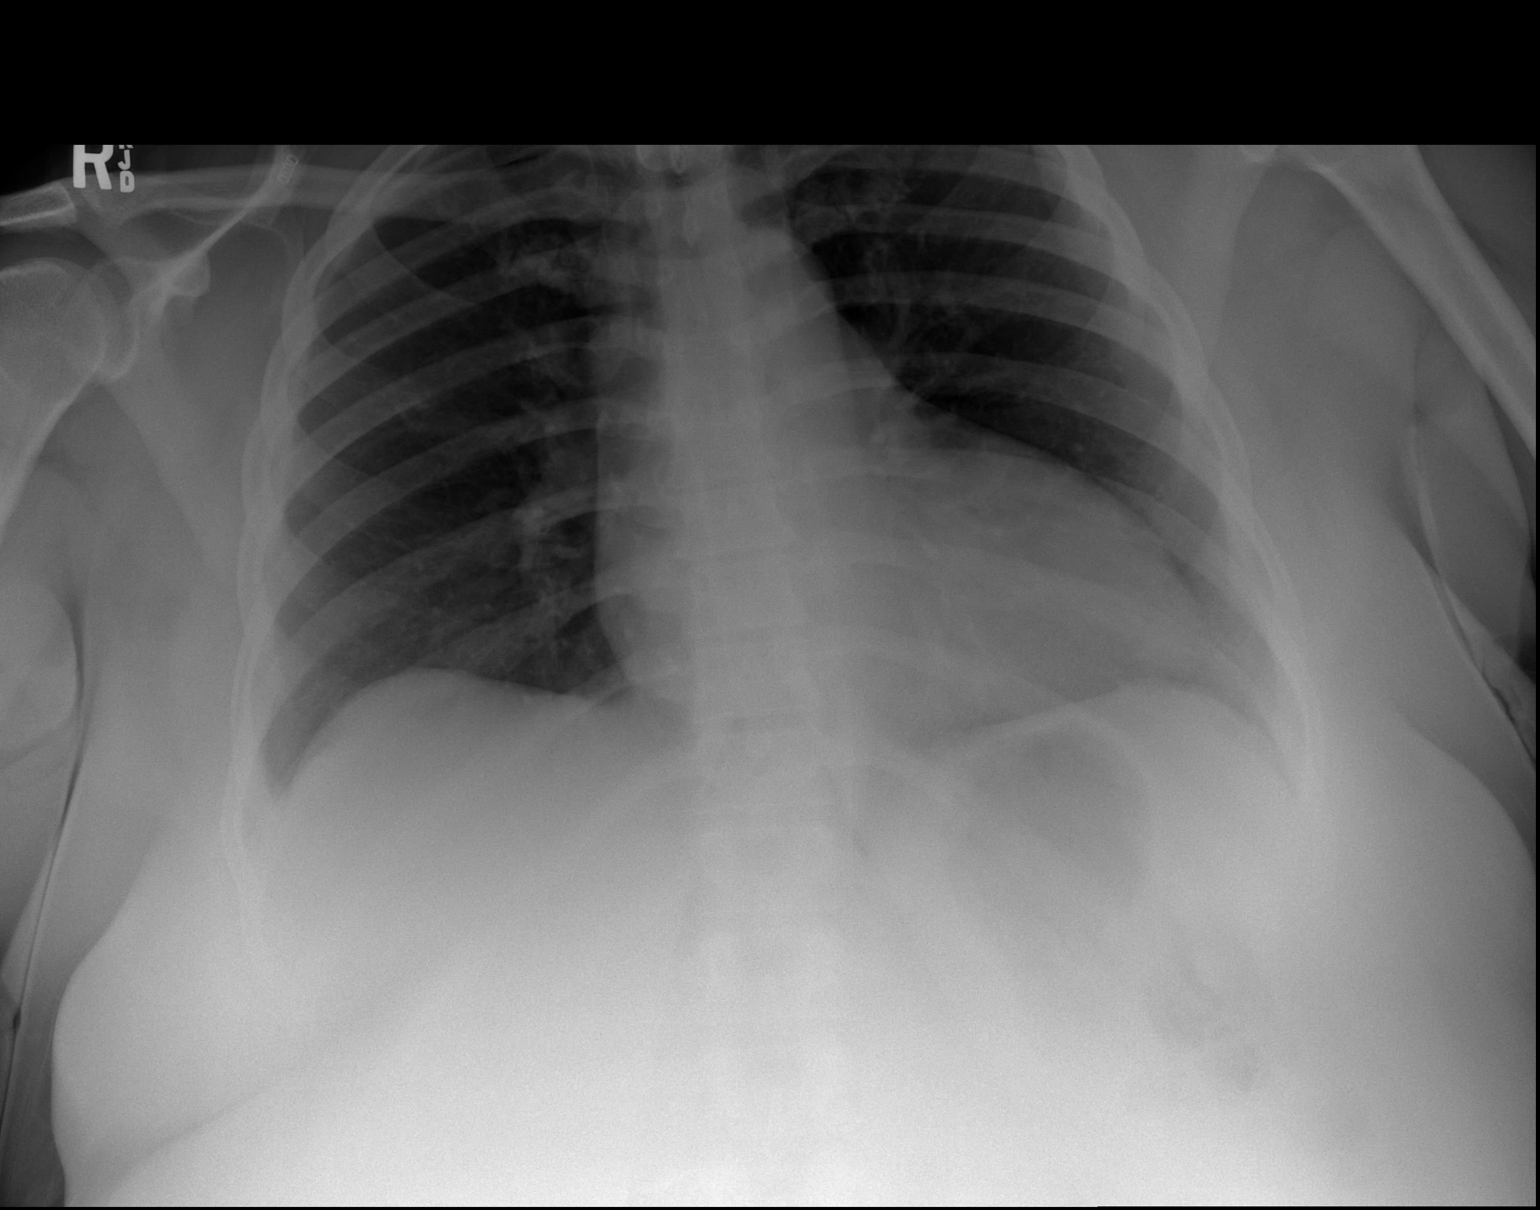

[2 of 2 positions shown; findings below may reference images not displayed]

FINDINGS: Chronic tracheostomy appears stable. Stable lung volumes. Normal
cardiac size and mediastinal contours. No pneumothorax, pulmonary
edema, pleural effusion or confluent pulmonary opacity. Negative
visible bowel gas pattern. No acute osseous abnormality identified.
IMPRESSION: No acute cardiopulmonary abnormality.

## 2018-02-19 ENCOUNTER — Other Ambulatory Visit (HOSPITAL_COMMUNITY)
Admission: RE | Admit: 2018-02-19 | Discharge: 2018-02-19 | Disposition: A | Discharge status: Home / Self Care | Payer: Medicare Other | Source: Ambulatory Visit | Attending: Family Medicine | Admitting: Family Medicine

## 2018-02-19 ENCOUNTER — Ambulatory Visit: Payer: Medicare Other | Attending: Family Medicine | Admitting: Family Medicine

## 2018-02-19 ENCOUNTER — Encounter: Payer: Self-pay | Admitting: Family Medicine

## 2018-02-19 VITALS — BP 147/101 | HR 93 | Temp 98.3°F | Ht 61.0 in | Wt 245.4 lb

## 2018-02-19 DIAGNOSIS — Z79899 Other long term (current) drug therapy: Secondary | ICD-10-CM | POA: Insufficient documentation

## 2018-02-19 DIAGNOSIS — G8929 Other chronic pain: Secondary | ICD-10-CM | POA: Diagnosis not present

## 2018-02-19 DIAGNOSIS — Z0001 Encounter for general adult medical examination with abnormal findings: Secondary | ICD-10-CM | POA: Insufficient documentation

## 2018-02-19 DIAGNOSIS — B373 Candidiasis of vulva and vagina: Secondary | ICD-10-CM | POA: Insufficient documentation

## 2018-02-19 DIAGNOSIS — J45909 Unspecified asthma, uncomplicated: Secondary | ICD-10-CM | POA: Diagnosis not present

## 2018-02-19 DIAGNOSIS — E1142 Type 2 diabetes mellitus with diabetic polyneuropathy: Secondary | ICD-10-CM | POA: Diagnosis not present

## 2018-02-19 DIAGNOSIS — I10 Essential (primary) hypertension: Secondary | ICD-10-CM | POA: Insufficient documentation

## 2018-02-19 DIAGNOSIS — K219 Gastro-esophageal reflux disease without esophagitis: Secondary | ICD-10-CM | POA: Insufficient documentation

## 2018-02-19 DIAGNOSIS — Z01419 Encounter for gynecological examination (general) (routine) without abnormal findings: Secondary | ICD-10-CM | POA: Diagnosis present

## 2018-02-19 DIAGNOSIS — Z1231 Encounter for screening mammogram for malignant neoplasm of breast: Secondary | ICD-10-CM | POA: Diagnosis not present

## 2018-02-19 DIAGNOSIS — Z930 Tracheostomy status: Secondary | ICD-10-CM | POA: Insufficient documentation

## 2018-02-19 DIAGNOSIS — Z888 Allergy status to other drugs, medicaments and biological substances status: Secondary | ICD-10-CM | POA: Diagnosis not present

## 2018-02-19 DIAGNOSIS — Z79891 Long term (current) use of opiate analgesic: Secondary | ICD-10-CM | POA: Insufficient documentation

## 2018-02-19 DIAGNOSIS — Z01411 Encounter for gynecological examination (general) (routine) with abnormal findings: Secondary | ICD-10-CM | POA: Diagnosis not present

## 2018-02-19 DIAGNOSIS — Z0000 Encounter for general adult medical examination without abnormal findings: Secondary | ICD-10-CM | POA: Diagnosis not present

## 2018-02-19 DIAGNOSIS — B3731 Acute candidiasis of vulva and vagina: Secondary | ICD-10-CM

## 2018-02-19 DIAGNOSIS — Z1239 Encounter for other screening for malignant neoplasm of breast: Secondary | ICD-10-CM

## 2018-02-19 DIAGNOSIS — Z90710 Acquired absence of both cervix and uterus: Secondary | ICD-10-CM | POA: Insufficient documentation

## 2018-02-19 DIAGNOSIS — N9089 Other specified noninflammatory disorders of vulva and perineum: Secondary | ICD-10-CM | POA: Insufficient documentation

## 2018-02-19 DIAGNOSIS — Z794 Long term (current) use of insulin: Secondary | ICD-10-CM | POA: Insufficient documentation

## 2018-02-19 DIAGNOSIS — Z9889 Other specified postprocedural states: Secondary | ICD-10-CM | POA: Insufficient documentation

## 2018-02-19 DIAGNOSIS — E876 Hypokalemia: Secondary | ICD-10-CM | POA: Diagnosis not present

## 2018-02-19 DIAGNOSIS — Z7951 Long term (current) use of inhaled steroids: Secondary | ICD-10-CM | POA: Diagnosis not present

## 2018-02-19 DIAGNOSIS — R51 Headache: Secondary | ICD-10-CM | POA: Insufficient documentation

## 2018-02-19 DIAGNOSIS — Z9049 Acquired absence of other specified parts of digestive tract: Secondary | ICD-10-CM | POA: Diagnosis not present

## 2018-02-19 LAB — POCT GLYCOSYLATED HEMOGLOBIN (HGB A1C): HBA1C, POC (CONTROLLED DIABETIC RANGE): 12 % — AB (ref 0.0–7.0)

## 2018-02-19 LAB — GLUCOSE, POCT (MANUAL RESULT ENTRY)
POC Glucose: 345 mg/dl — AB (ref 70–99)
POC Glucose: 391 mg/dl — AB (ref 70–99)

## 2018-02-19 MED ORDER — FLUCONAZOLE 150 MG PO TABS: 150.0000 mg | ORAL_TABLET | Freq: Every day | ORAL | 0 refills | Status: DC

## 2018-02-19 MED ORDER — INSULIN ASPART 100 UNIT/ML ~~LOC~~ SOLN
7.0000 [IU] | Freq: Once | SUBCUTANEOUS | Status: AC
  Administered 2018-02-19: 7 [IU] via SUBCUTANEOUS

## 2018-02-19 NOTE — Patient Instructions (Signed)

## 2018-02-21 ENCOUNTER — Encounter: Payer: Self-pay | Admitting: Family Medicine

## 2018-02-21 NOTE — Progress Notes (Signed)
Subjective:  Patient ID: [name], female    DOB: 02/17/1971  Age: 47 y.o. MRN: [id number]  CC: Annual Exam and Gynecologic Exam   [name] presents for a medicare annual wellness exam. Her CBG is 390 in the clinic and her A1c is 12.0 and she endorses adherence with her prescribed regimen from Endocrine. Novolog 7 units administered.  Health care screenings: Cervical ca: not indicated due to hysterectomy status Mammogram: due Colonoscopy: not indicated due to age<50 Urinary incontinence :denies   ADLs: able to perform with husband's  help due to tracheostomy status  Fall Risk  05/14/2017 01/05/2017 04/06/2015  Falls in the past year? No No No     Office Visit from 02/19/2018 in [facility]  PHQ-2 Total Score  5     Reviewed medications, discussed advanced care planning; husband has power of attorney.  Past Medical History:  Diagnosis Date  . Arthritis   . Asthma   . Chronic back pain   . Chronic chest pain   . Depression   . DM (diabetes mellitus) ([place])    INSULIN DEPENDENT  . GERD (gastroesophageal reflux disease)   . Headache(784.0)   . Hypertension   . Hypokalemia   . Respiratory disease 05/2017  . Tracheostomy in place [facility]) 02/2017    Past Surgical History:  Procedure Laterality Date  . APPENDECTOMY    . CESAREAN SECTION     x 3  . CHOLECYSTECTOMY N/A 03/02/2014   Procedure: LAPAROSCOPIC CHOLECYSTECTOMY;  Surgeon: Joyice Faster. Cornett, MD;  Location: [place];  Service: General;  Laterality: N/A;  . HERNIA REPAIR    . PANENDOSCOPY N/A 03/04/2017   Procedure: PANENDOSCOPY WITH POSSIBLE FOREIGN BODY REMOVAL;  Surgeon: Jodi Marble, MD;  Location: [place];  Service: ENT;  Laterality: N/A;  . ROTATOR CUFF REPAIR    . TRACHEOSTOMY  02/2017  . VESICOVAGINAL FISTULA CLOSURE W/ TAH  2009    Allergies  Allergen Reactions  . Reglan [Metoclopramide] Other (See Comments)    Panic attack       Outpatient Medications Prior to Visit  Medication Sig Dispense Refill  . ACCU-CHEK SOFTCLIX LANCETS lancets Use as instructed to check blood sugar once daily. E11.9 100 each 12  . acetaminophen-codeine (TYLENOL #3) 300-30 MG tablet Take 1 tablet by mouth at bedtime as needed for moderate pain. 30 tablet 1  . albuterol (PROAIR HFA) 108 (90 Base) MCG/ACT inhaler Inhale 2 puffs into the lungs every 6 (six) hours as needed for wheezing or shortness of breath. 1 Inhaler 3  . albuterol (PROVENTIL) (2.5 MG/3ML) 0.083% nebulizer solution Take 3 mLs (2.5 mg total) by nebulization every 6 (six) hours as needed for wheezing or shortness of breath. 75 mL 3  . amLODipine (NORVASC) 10 MG tablet Take 1 tablet (10 mg total) by mouth daily. 90 tablet 1  . atorvastatin (LIPITOR) 20 MG tablet Take 1 tablet (20 mg total) by mouth daily. 90 tablet 1  . Blood Glucose Monitoring Suppl (ACCU-CHEK AVIVA PLUS) w/Device KIT Used to check blood sugars once daily. E11.9 1 kit 0  . budesonide-formoterol (SYMBICORT) 160-4.5 MCG/ACT inhaler Inhale 2 puffs into the lungs 2 (two) times daily. 3 Inhaler 1  . carvedilol (COREG) 12.5 MG tablet Take 1 tablet (12.5 mg total) by mouth 2 (two) times daily with a meal. 180 tablet 1  . chlorhexidine gluconate, MEDLINE KIT, (PERIDEX) 0.12 % solution 15 mLs by Mouth Rinse route 2 (two) times daily.  120 mL 0  . gabapentin (NEURONTIN) 300 MG capsule Take 2 capsules (600 mg total) by mouth 2 (two) times daily. 360 capsule 1  . glucose blood (ACCU-CHEK AVIVA) test strip Use as directed to check blood sugars once daily. E11.9 100 each 12  . Insulin Glargine (BASAGLAR KWIKPEN) 100 UNIT/ML SOPN Inject 1 mL (100 Units total) into the skin every morning. And pen needles 1/day 15 pen 11  . Insulin Pen Needle 31G X 5 MM MISC 1 each by Does not apply route at bedtime. 30 each 5  . montelukast (SINGULAIR) 10 MG tablet Take 1 tablet (10 mg total) by mouth at bedtime. 90 tablet 1  . pantoprazole  (PROTONIX) 40 MG tablet Take 1 tablet (40 mg total) by mouth daily. 90 tablet 1  . sertraline (ZOLOFT) 100 MG tablet Place 1 tablet (100 mg total) into feeding tube daily. 30 tablet 5  . sodium chloride HYPERTONIC 3 % nebulizer solution Take 4 mLs by nebulization 2 (two) times daily. 750 mL 0  . SUMAtriptan (IMITREX) 100 MG tablet At the onset of a migraine; may repeat in 2 hours. Max daily dose 255m 20 tablet 1  . tiZANidine (ZANAFLEX) 4 MG tablet Take 1 tablet (4 mg total) by mouth every 8 (eight) hours as needed for muscle spasms. 90 tablet 3  . topiramate (TOPAMAX) 100 MG tablet Take 1 tablet (100 mg total) by mouth 2 (two) times daily. 180 tablet 1  . Artificial Tear Ointment (ARTIFICIAL TEARS) ointment Place 1 application into both eyes at bedtime.     .Marland KitchenHYDROcodone-homatropine (HYCODAN) 5-1.5 MG/5ML syrup Take 5 mLs by mouth every 6 (six) hours as needed for cough. (Patient not taking: Reported on 02/19/2018) 473 mL 0   No facility-administered medications prior to visit.     ROS Review of Systems  Constitutional: Negative for activity change, appetite change and fatigue.  HENT: Negative for congestion, sinus pressure and sore throat.   Eyes: Negative for visual disturbance.  Respiratory: Negative for cough, chest tightness, shortness of breath and wheezing.   Cardiovascular: Negative for chest pain and palpitations.  Gastrointestinal: Negative for abdominal distention, abdominal pain and constipation.  Endocrine: Negative for polydipsia.  Genitourinary: Negative for dysuria and frequency.  Musculoskeletal: Negative for arthralgias and back pain.  Skin: Negative for rash.  Neurological: Negative for tremors, light-headedness and numbness.  Hematological: Does not bruise/bleed easily.  Psychiatric/Behavioral: Negative for agitation and behavioral problems.    Objective:  BP (!) 147/101   Pulse 93   Temp 98.3 F (36.8 C) (Oral)   Ht 5' 1" (1.549 m)   Wt 245 lb 6.4 oz (111.3  kg)   SpO2 91%   BMI 46.37 kg/m   BP/Weight 02/19/2018 45/05/397637/11/4191 Systolic BP 179012401973 Diastolic BP 1532199282  Wt. (Lbs) 245.4 242.2 241.6  BMI 46.37 45.76 45.65      Physical Exam  Constitutional: She is oriented to person, place, and time. She appears well-developed and well-nourished. No distress.  HENT:  Head: Normocephalic.  Right Ear: External ear normal.  Left Ear: External ear normal.  Nose: Nose normal.  Mouth/Throat: Oropharynx is clear and moist.  Eyes: Pupils are equal, round, and reactive to light. Conjunctivae and EOM are normal.  Neck: Normal range of motion. No JVD present.  Tracheostomy tube in place  Cardiovascular: Normal rate, regular rhythm, normal heart sounds and intact distal pulses. Exam reveals no gallop.  No murmur heard. Pulmonary/Chest: Effort normal and  breath sounds normal. No respiratory distress. She has no wheezes. She has no rales. She exhibits no tenderness. Right breast exhibits no mass and no tenderness. Left breast exhibits no mass and no tenderness.  Abdominal: Soft. Bowel sounds are normal. She exhibits no distension and no mass. There is no tenderness.  Genitourinary:  Genitourinary Comments: Greyish- white discoloration of labia majora Left labia minora with skin non tender lesions Vaginal discharge Absent cervix  Musculoskeletal: Normal range of motion. She exhibits no edema or tenderness.  Neurological: She is alert and oriented to person, place, and time. She has normal reflexes.  Skin: Skin is warm and dry. She is not diaphoretic.  Psychiatric: She has a normal mood and affect.     Assessment & Plan:   1. Medicare annual wellness visit, initial Counseled on 150 minutes of exercise per week, healthy eating (including decreased daily intake of saturated fats, cholesterol, added sugars, sodium), STI prevention, routine healthcare maintenance. Discussed age appropriate preventive health screenings - Cytology -  PAP([place])  2. Screening for breast cancer - MM Digital Screening; Future  3. Vaginal candidiasis Likely due to uncontrolled Diabetes  4. Type 2 diabetes mellitus with diabetic polyneuropathy, without long-term current use of insulin (HCC) Novolog 7 units administered due to CBG of 390 and blood sugar repeated after. Follow up with Endocrine - insulin aspart (novoLOG) injection 7 Units - POCT glucose (manual entry) - POCT glucose (manual entry) - POCT glycosylated hemoglobin (Hb A1C)  5. Vulvar lesions Vaginal PAP smear performed as she is s/p hysterectomy secondary to fibroids Will refer to GYN due to vulvar lesions  Meds ordered this encounter  Medications  . fluconazole (DIFLUCAN) 150 MG tablet    Sig: Take 1 tablet (150 mg total) by mouth daily.    Dispense:  7 tablet    Refill:  0  . insulin aspart (novoLOG) injection 7 Units    Follow-up: Return for follow up of chronic medical conditions, keep previously scheduled appointment.   [name] MD

## 2018-02-23 DIAGNOSIS — Z930 Tracheostomy status: Secondary | ICD-10-CM | POA: Diagnosis not present

## 2018-02-23 DIAGNOSIS — Z430 Encounter for attention to tracheostomy: Secondary | ICD-10-CM | POA: Diagnosis not present

## 2018-02-23 DIAGNOSIS — J398 Other specified diseases of upper respiratory tract: Secondary | ICD-10-CM | POA: Diagnosis not present

## 2018-02-23 DIAGNOSIS — J386 Stenosis of larynx: Secondary | ICD-10-CM | POA: Diagnosis not present

## 2018-02-24 LAB — CYTOLOGY - PAP
CHLAMYDIA, DNA PROBE: NEGATIVE
Candida vaginitis: POSITIVE — AB
Diagnosis: NEGATIVE
HPV (WINDOPATH): NOT DETECTED
Neisseria Gonorrhea: NEGATIVE
Trichomonas: NEGATIVE

## 2018-02-25 LAB — CERVICOVAGINAL ANCILLARY ONLY: Herpes: NEGATIVE

## 2018-03-02 DIAGNOSIS — R490 Dysphonia: Secondary | ICD-10-CM | POA: Diagnosis not present

## 2018-03-05 ENCOUNTER — Inpatient Hospital Stay (HOSPITAL_COMMUNITY)
Admission: EM | Admit: 2018-03-05 | Discharge: 2018-03-11 | DRG: 202 | Disposition: A | Discharge status: Home / Self Care | Payer: Medicare Other | Attending: Internal Medicine | Admitting: Internal Medicine

## 2018-03-05 ENCOUNTER — Other Ambulatory Visit: Payer: Self-pay

## 2018-03-05 ENCOUNTER — Emergency Department (HOSPITAL_COMMUNITY): Payer: Medicare Other

## 2018-03-05 ENCOUNTER — Encounter (HOSPITAL_COMMUNITY): Payer: Self-pay | Admitting: Emergency Medicine

## 2018-03-05 DIAGNOSIS — G8929 Other chronic pain: Secondary | ICD-10-CM | POA: Diagnosis present

## 2018-03-05 DIAGNOSIS — R0789 Other chest pain: Secondary | ICD-10-CM | POA: Diagnosis not present

## 2018-03-05 DIAGNOSIS — Z79899 Other long term (current) drug therapy: Secondary | ICD-10-CM | POA: Diagnosis not present

## 2018-03-05 DIAGNOSIS — K219 Gastro-esophageal reflux disease without esophagitis: Secondary | ICD-10-CM | POA: Diagnosis present

## 2018-03-05 DIAGNOSIS — R0603 Acute respiratory distress: Secondary | ICD-10-CM | POA: Diagnosis not present

## 2018-03-05 DIAGNOSIS — J069 Acute upper respiratory infection, unspecified: Secondary | ICD-10-CM | POA: Diagnosis present

## 2018-03-05 DIAGNOSIS — R042 Hemoptysis: Secondary | ICD-10-CM | POA: Diagnosis not present

## 2018-03-05 DIAGNOSIS — E1142 Type 2 diabetes mellitus with diabetic polyneuropathy: Secondary | ICD-10-CM | POA: Diagnosis not present

## 2018-03-05 DIAGNOSIS — J45901 Unspecified asthma with (acute) exacerbation: Secondary | ICD-10-CM | POA: Diagnosis present

## 2018-03-05 DIAGNOSIS — Z794 Long term (current) use of insulin: Secondary | ICD-10-CM | POA: Diagnosis not present

## 2018-03-05 DIAGNOSIS — M549 Dorsalgia, unspecified: Secondary | ICD-10-CM | POA: Diagnosis not present

## 2018-03-05 DIAGNOSIS — N179 Acute kidney failure, unspecified: Secondary | ICD-10-CM

## 2018-03-05 DIAGNOSIS — Z9111 Patient's noncompliance with dietary regimen: Secondary | ICD-10-CM | POA: Diagnosis not present

## 2018-03-05 DIAGNOSIS — Z87891 Personal history of nicotine dependence: Secondary | ICD-10-CM | POA: Diagnosis not present

## 2018-03-05 DIAGNOSIS — Z7951 Long term (current) use of inhaled steroids: Secondary | ICD-10-CM | POA: Diagnosis not present

## 2018-03-05 DIAGNOSIS — G4733 Obstructive sleep apnea (adult) (pediatric): Secondary | ICD-10-CM | POA: Diagnosis present

## 2018-03-05 DIAGNOSIS — Z6841 Body Mass Index (BMI) 40.0 and over, adult: Secondary | ICD-10-CM | POA: Diagnosis not present

## 2018-03-05 DIAGNOSIS — Z930 Tracheostomy status: Secondary | ICD-10-CM | POA: Diagnosis not present

## 2018-03-05 DIAGNOSIS — E872 Acidosis: Secondary | ICD-10-CM | POA: Diagnosis not present

## 2018-03-05 DIAGNOSIS — D72820 Lymphocytosis (symptomatic): Secondary | ICD-10-CM | POA: Diagnosis not present

## 2018-03-05 DIAGNOSIS — R05 Cough: Secondary | ICD-10-CM

## 2018-03-05 DIAGNOSIS — E662 Morbid (severe) obesity with alveolar hypoventilation: Secondary | ICD-10-CM | POA: Diagnosis not present

## 2018-03-05 DIAGNOSIS — J4551 Severe persistent asthma with (acute) exacerbation: Secondary | ICD-10-CM | POA: Diagnosis present

## 2018-03-05 DIAGNOSIS — Z8249 Family history of ischemic heart disease and other diseases of the circulatory system: Secondary | ICD-10-CM | POA: Diagnosis not present

## 2018-03-05 DIAGNOSIS — E1165 Type 2 diabetes mellitus with hyperglycemia: Secondary | ICD-10-CM | POA: Diagnosis not present

## 2018-03-05 DIAGNOSIS — R079 Chest pain, unspecified: Secondary | ICD-10-CM | POA: Diagnosis not present

## 2018-03-05 DIAGNOSIS — J208 Acute bronchitis due to other specified organisms: Secondary | ICD-10-CM | POA: Clinically undetermined

## 2018-03-05 DIAGNOSIS — R058 Other specified cough: Secondary | ICD-10-CM

## 2018-03-05 DIAGNOSIS — G4700 Insomnia, unspecified: Secondary | ICD-10-CM | POA: Diagnosis present

## 2018-03-05 DIAGNOSIS — D72829 Elevated white blood cell count, unspecified: Secondary | ICD-10-CM | POA: Diagnosis not present

## 2018-03-05 DIAGNOSIS — I10 Essential (primary) hypertension: Secondary | ICD-10-CM | POA: Diagnosis present

## 2018-03-05 DIAGNOSIS — R51 Headache: Secondary | ICD-10-CM | POA: Diagnosis not present

## 2018-03-05 DIAGNOSIS — Z833 Family history of diabetes mellitus: Secondary | ICD-10-CM

## 2018-03-05 DIAGNOSIS — R0602 Shortness of breath: Secondary | ICD-10-CM | POA: Diagnosis not present

## 2018-03-05 LAB — BASIC METABOLIC PANEL
ANION GAP: 12 (ref 5–15)
BUN: 10 mg/dL (ref 6–20)
CALCIUM: 9.1 mg/dL (ref 8.9–10.3)
CO2: 22 mmol/L (ref 22–32)
CREATININE: 1.53 mg/dL — AB (ref 0.44–1.00)
Chloride: 101 mmol/L (ref 101–111)
GFR, EST AFRICAN AMERICAN: 45 mL/min — AB (ref >60)
GFR, EST NON AFRICAN AMERICAN: 39 mL/min — AB (ref >60)
Glucose, Bld: 534 mg/dL (ref 65–99)
Potassium: 3.9 mmol/L (ref 3.5–5.1)
SODIUM: 135 mmol/L (ref 135–145)

## 2018-03-05 LAB — CBC WITH DIFFERENTIAL/PLATELET
ABS IMMATURE GRANULOCYTES: 0 10*3/uL (ref 0.0–0.1)
BASOS ABS: 0 10*3/uL (ref 0.0–0.1)
Basophils Relative: 0 %
EOS ABS: 0 10*3/uL (ref 0.0–0.7)
Eosinophils Relative: 0 %
HCT: 39.5 % (ref 36.0–46.0)
Hemoglobin: 12.5 g/dL (ref 12.0–15.0)
IMMATURE GRANULOCYTES: 0 %
Lymphocytes Relative: 54 %
Lymphs Abs: 5.4 10*3/uL — ABNORMAL HIGH (ref 0.7–4.0)
MCH: 27.4 pg (ref 26.0–34.0)
MCHC: 31.6 g/dL (ref 30.0–36.0)
MCV: 86.6 fL (ref 78.0–100.0)
Monocytes Absolute: 0.7 10*3/uL (ref 0.1–1.0)
Monocytes Relative: 7 %
NEUTROS ABS: 4.1 10*3/uL (ref 1.7–7.7)
NEUTROS PCT: 39 %
Platelets: 362 10*3/uL (ref 150–400)
RBC: 4.56 MIL/uL (ref 3.87–5.11)
RDW: 13.5 % (ref 11.5–15.5)
WBC: 10.3 10*3/uL (ref 4.0–10.5)

## 2018-03-05 LAB — I-STAT TROPONIN, ED: TROPONIN I, POC: 0 ng/mL (ref 0.00–0.08)

## 2018-03-05 LAB — I-STAT CG4 LACTIC ACID, ED: LACTIC ACID, VENOUS: 2.15 mmol/L — AB (ref 0.5–1.9)

## 2018-03-05 LAB — CBG MONITORING, ED: Glucose-Capillary: 252 mg/dL — ABNORMAL HIGH (ref 65–99)

## 2018-03-05 MED ORDER — IOPAMIDOL (ISOVUE-370) INJECTION 76%: 100.0000 mL | Freq: Once | INTRAVENOUS | Status: DC

## 2018-03-05 MED ORDER — BASAGLAR KWIKPEN 100 UNIT/ML ~~LOC~~ SOPN: 100.0000 [IU] | PEN_INJECTOR | SUBCUTANEOUS | Status: DC

## 2018-03-05 MED ORDER — METHYLPREDNISOLONE SODIUM SUCC 125 MG IJ SOLR
125.0000 mg | Freq: Once | INTRAMUSCULAR | Status: AC
  Administered 2018-03-05: 125 mg via INTRAVENOUS
  Filled 2018-03-05: qty 2

## 2018-03-05 MED ORDER — MOMETASONE FURO-FORMOTEROL FUM 200-5 MCG/ACT IN AERO
2.0000 | INHALATION_SPRAY | Freq: Two times a day (BID) | RESPIRATORY_TRACT | Status: DC
  Administered 2018-03-06 – 2018-03-07 (×2): 2 via RESPIRATORY_TRACT
  Filled 2018-03-05: qty 8.8

## 2018-03-05 MED ORDER — INSULIN ASPART 100 UNIT/ML ~~LOC~~ SOLN
0.0000 [IU] | Freq: Three times a day (TID) | SUBCUTANEOUS | Status: DC
  Administered 2018-03-06: 20 [IU] via SUBCUTANEOUS
  Filled 2018-03-05: qty 1

## 2018-03-05 MED ORDER — SODIUM CHLORIDE 0.9 % IV SOLN
1.0000 g | Freq: Once | INTRAVENOUS | Status: AC
  Administered 2018-03-05: 1 g via INTRAVENOUS
  Filled 2018-03-05: qty 10

## 2018-03-05 MED ORDER — IOPAMIDOL (ISOVUE-370) INJECTION 76%
100.0000 mL | Freq: Once | INTRAVENOUS | Status: AC | PRN
  Administered 2018-03-05: 100 mL via INTRAVENOUS

## 2018-03-05 MED ORDER — SODIUM CHLORIDE 0.9% FLUSH
3.0000 mL | Freq: Two times a day (BID) | INTRAVENOUS | Status: DC
  Administered 2018-03-07 – 2018-03-11 (×7): 3 mL via INTRAVENOUS

## 2018-03-05 MED ORDER — GUAIFENESIN-CODEINE 100-10 MG/5ML PO SOLN
5.0000 mL | Freq: Four times a day (QID) | ORAL | Status: DC | PRN
  Administered 2018-03-06: 5 mL via ORAL
  Filled 2018-03-05: qty 5

## 2018-03-05 MED ORDER — MONTELUKAST SODIUM 10 MG PO TABS
10.0000 mg | ORAL_TABLET | Freq: Every day | ORAL | Status: DC
  Administered 2018-03-06 – 2018-03-10 (×6): 10 mg via ORAL
  Filled 2018-03-05 (×8): qty 1

## 2018-03-05 MED ORDER — IPRATROPIUM-ALBUTEROL 0.5-2.5 (3) MG/3ML IN SOLN: 3.0000 mL | Freq: Once | RESPIRATORY_TRACT | Status: DC

## 2018-03-05 MED ORDER — INSULIN ASPART 100 UNIT/ML ~~LOC~~ SOLN: 0.0000 [IU] | Freq: Every day | SUBCUTANEOUS | Status: DC

## 2018-03-05 MED ORDER — TOPIRAMATE 100 MG PO TABS
100.0000 mg | ORAL_TABLET | Freq: Two times a day (BID) | ORAL | Status: DC
  Administered 2018-03-06 – 2018-03-11 (×3): 100 mg via ORAL
  Filled 2018-03-05 (×14): qty 1

## 2018-03-05 MED ORDER — MORPHINE SULFATE (PF) 4 MG/ML IV SOLN
4.0000 mg | Freq: Once | INTRAVENOUS | Status: AC
Start: 2018-03-05 — End: 2018-03-05
  Administered 2018-03-05: 4 mg via INTRAVENOUS
  Filled 2018-03-05: qty 1

## 2018-03-05 MED ORDER — SODIUM CHLORIDE 3 % IN NEBU
4.0000 mL | INHALATION_SOLUTION | Freq: Two times a day (BID) | RESPIRATORY_TRACT | Status: DC
  Administered 2018-03-06 – 2018-03-07 (×3): 4 mL via RESPIRATORY_TRACT
  Filled 2018-03-05 (×4): qty 4

## 2018-03-05 MED ORDER — AMLODIPINE BESYLATE 10 MG PO TABS
10.0000 mg | ORAL_TABLET | Freq: Every day | ORAL | Status: DC
  Administered 2018-03-06 – 2018-03-11 (×6): 10 mg via ORAL
  Filled 2018-03-05: qty 2
  Filled 2018-03-05 (×6): qty 1

## 2018-03-05 MED ORDER — FLUTICASONE PROPIONATE 50 MCG/ACT NA SUSP
2.0000 | Freq: Every day | NASAL | Status: DC
  Administered 2018-03-06 – 2018-03-11 (×6): 2 via NASAL
  Filled 2018-03-05: qty 16

## 2018-03-05 MED ORDER — ONDANSETRON HCL 4 MG PO TABS: 4.0000 mg | ORAL_TABLET | Freq: Four times a day (QID) | ORAL | Status: DC | PRN

## 2018-03-05 MED ORDER — IOPAMIDOL (ISOVUE-370) INJECTION 76%
INTRAVENOUS | Status: AC
  Filled 2018-03-05: qty 100

## 2018-03-05 MED ORDER — IPRATROPIUM-ALBUTEROL 0.5-2.5 (3) MG/3ML IN SOLN
3.0000 mL | Freq: Four times a day (QID) | RESPIRATORY_TRACT | Status: DC
  Administered 2018-03-06 – 2018-03-07 (×8): 3 mL via RESPIRATORY_TRACT
  Filled 2018-03-05 (×7): qty 3

## 2018-03-05 MED ORDER — SODIUM CHLORIDE 0.9 % IV SOLN
500.0000 mg | Freq: Once | INTRAVENOUS | Status: AC
  Administered 2018-03-05: 500 mg via INTRAVENOUS
  Filled 2018-03-05: qty 500

## 2018-03-05 MED ORDER — AZITHROMYCIN 250 MG PO TABS
250.0000 mg | ORAL_TABLET | Freq: Every day | ORAL | Status: DC
  Administered 2018-03-06 – 2018-03-08 (×3): 250 mg via ORAL
  Filled 2018-03-05 (×3): qty 1

## 2018-03-05 MED ORDER — ALBUTEROL SULFATE (2.5 MG/3ML) 0.083% IN NEBU: 2.5000 mg | INHALATION_SOLUTION | RESPIRATORY_TRACT | Status: DC | PRN

## 2018-03-05 MED ORDER — ACETAMINOPHEN 650 MG RE SUPP: 650.0000 mg | Freq: Four times a day (QID) | RECTAL | Status: DC | PRN

## 2018-03-05 MED ORDER — IPRATROPIUM-ALBUTEROL 0.5-2.5 (3) MG/3ML IN SOLN
3.0000 mL | Freq: Once | RESPIRATORY_TRACT | Status: AC
  Administered 2018-03-05: 3 mL via RESPIRATORY_TRACT
  Filled 2018-03-05: qty 3

## 2018-03-05 MED ORDER — SODIUM CHLORIDE 0.9 % IV SOLN
INTRAVENOUS | Status: DC
  Administered 2018-03-06: 01:00:00 via INTRAVENOUS
  Administered 2018-03-06: 1000 mL via INTRAVENOUS

## 2018-03-05 MED ORDER — ENOXAPARIN SODIUM 40 MG/0.4ML ~~LOC~~ SOLN
40.0000 mg | SUBCUTANEOUS | Status: DC
  Administered 2018-03-06 – 2018-03-07 (×2): 40 mg via SUBCUTANEOUS
  Filled 2018-03-05 (×2): qty 0.4

## 2018-03-05 MED ORDER — MORPHINE SULFATE (PF) 4 MG/ML IV SOLN
4.0000 mg | Freq: Once | INTRAVENOUS | Status: AC
  Administered 2018-03-05: 4 mg via INTRAVENOUS
  Filled 2018-03-05: qty 1

## 2018-03-05 MED ORDER — SERTRALINE HCL 100 MG PO TABS
100.0000 mg | ORAL_TABLET | Freq: Every day | ORAL | Status: DC
  Administered 2018-03-06 – 2018-03-11 (×6): 100 mg
  Filled 2018-03-05 (×6): qty 1

## 2018-03-05 MED ORDER — PANTOPRAZOLE SODIUM 40 MG PO TBEC
40.0000 mg | DELAYED_RELEASE_TABLET | Freq: Every day | ORAL | Status: DC
  Administered 2018-03-06 – 2018-03-11 (×6): 40 mg via ORAL
  Filled 2018-03-05 (×6): qty 1

## 2018-03-05 MED ORDER — REFRESH P.M. OP OINT: 1.0000 "application " | TOPICAL_OINTMENT | Freq: Every day | OPHTHALMIC | Status: DC

## 2018-03-05 MED ORDER — KETOROLAC TROMETHAMINE 30 MG/ML IJ SOLN: 30.0000 mg | Freq: Four times a day (QID) | INTRAMUSCULAR | Status: DC | PRN

## 2018-03-05 MED ORDER — CARVEDILOL 12.5 MG PO TABS
12.5000 mg | ORAL_TABLET | Freq: Two times a day (BID) | ORAL | Status: DC
  Administered 2018-03-06 – 2018-03-11 (×12): 12.5 mg via ORAL
  Filled 2018-03-05 (×12): qty 1

## 2018-03-05 MED ORDER — SODIUM CHLORIDE 0.9 % IV BOLUS
1000.0000 mL | Freq: Once | INTRAVENOUS | Status: AC
  Administered 2018-03-05: 1000 mL via INTRAVENOUS

## 2018-03-05 MED ORDER — POLYETHYLENE GLYCOL 3350 17 G PO PACK
17.0000 g | PACK | Freq: Every day | ORAL | Status: DC | PRN
Start: 2018-03-05 — End: 2018-03-12

## 2018-03-05 MED ORDER — ACETAMINOPHEN 325 MG PO TABS
650.0000 mg | ORAL_TABLET | Freq: Four times a day (QID) | ORAL | Status: DC | PRN
  Administered 2018-03-06 – 2018-03-10 (×10): 650 mg via ORAL
  Filled 2018-03-05 (×11): qty 2

## 2018-03-05 MED ORDER — ATORVASTATIN CALCIUM 20 MG PO TABS
20.0000 mg | ORAL_TABLET | Freq: Every day | ORAL | Status: DC
  Administered 2018-03-06 – 2018-03-11 (×6): 20 mg via ORAL
  Filled 2018-03-05 (×6): qty 1

## 2018-03-05 MED ORDER — GABAPENTIN 300 MG PO CAPS
600.0000 mg | ORAL_CAPSULE | Freq: Two times a day (BID) | ORAL | Status: DC
  Administered 2018-03-06 – 2018-03-11 (×12): 600 mg via ORAL
  Filled 2018-03-05 (×13): qty 2

## 2018-03-05 MED ORDER — ONDANSETRON HCL 4 MG/2ML IJ SOLN: 4.0000 mg | Freq: Four times a day (QID) | INTRAMUSCULAR | Status: DC | PRN

## 2018-03-05 NOTE — ED Notes (Signed)
repaged IntMed to [place], [place]

## 2018-03-05 NOTE — ED Notes (Signed)
Dr. Zenia Resides aware if glucose 534 and lactic 2.15

## 2018-03-05 NOTE — ED Provider Notes (Signed)
Patient placed in Quick Look pathway, seen and evaluated   Chief Complaint: Shortness of breath  HPI:   Shortness of breath, cough, increased sputum production, and subjective fever for past week.  States she now has blood-tinged sputum from her trach.  [name] is been in place for at least the last year.  Denies increased orthopnea, peripheral edema.  ROS: Shortness of breath (one)  Physical Exam:   Gen: No distress  Neuro: Awake and Alert  Skin: Warm    Focused Exam:   No diaphoresis.  No pallor.  Pulmonary: Some tachypnea noted.  SPO2 98% on room air.  Some increased sputum production from the patient's trach.  Unable to clear her trach without noted difficulty.  Mild expiratory wheezes globally.  Has good air movement.  Cardiac: Tachycardic 114-120.  Peripheral pulses intact.  MSK: No peripheral edema.   Initiation of care has begun. The patient has been counseled on the process, plan, and necessity for staying for the completion/evaluation, and the remainder of the medical screening examination   [name] 03/05/18 1401    Duffy Bruce, MD 03/06/18 1315

## 2018-03-05 NOTE — ED Provider Notes (Signed)
[name] EMERGENCY DEPARTMENT Provider Note   CSN: [id number] Arrival date & time: 03/05/18  1311   History   Chief Complaint Chief Complaint  Patient presents with  . Shortness of Breath    [name] is a 47 y.o. female.  The history is provided by the patient.   47 yo F with PMHx of asthma s/p tracheostomy, DM, HTN who presents with gradually worsening dyspnea x 1 weeks. Accompanied by productive cough with sputum that was initially green, now blood tinged x 4 days, fever 101F, chest tightness. Albuterol does not relieve symptoms. Denies leg swelling or pain. No sick contacts.   Past Medical History:  Diagnosis Date  . Arthritis   . Asthma   . Chronic back pain   . Chronic chest pain   . Depression   . DM (diabetes mellitus) ([place])    INSULIN DEPENDENT  . GERD (gastroesophageal reflux disease)   . Headache(784.0)   . Hypertension   . Hypokalemia   . Respiratory disease 05/2017  . Tracheostomy in place [facility]) 02/2017    Patient Active Problem List   Diagnosis Date Noted  . Carpal tunnel syndrome 01/18/2018  . Acute respiratory distress 06/02/2017  . Elevated lactic acid level 06/02/2017  . Sepsis ([place]) 05/07/2017  . Cough 05/06/2017  . Normocytic anemia 05/06/2017  . Hypokalemia 05/06/2017  . Tracheostomy status ([place]) 04/28/2017  . Tracheal stenosis 03/04/2017  . Acute blood loss anemia   . Generalized anxiety disorder   . Hypoalbuminemia due to protein-calorie malnutrition ([place])   . Severe asthma with acute exacerbation   . Critical illness myopathy 02/27/2017  . Type 2 diabetes mellitus with diabetic polyneuropathy, without long-term current use of insulin ([place])   . Morbid obesity ([place])   . Chronic pain syndrome   . Acute renal failure ([place])   . Gastritis and gastroduodenitis 02/22/2016  . Depression 02/22/2016  . Migraine 01/30/2015  . Back pain 09/05/2013  . Essential hypertension 09/05/2013  . Acute  bronchitis 05/01/2013  . Asthma exacerbation 04/27/2013  . Allergic rhinitis, seasonal 08/11/2012  . Chronic cough 08/11/2012  . Sleep apnea, obstructive 12/04/2011    Past Surgical History:  Procedure Laterality Date  . APPENDECTOMY    . CESAREAN SECTION     x 3  . CHOLECYSTECTOMY N/A 03/02/2014   Procedure: LAPAROSCOPIC CHOLECYSTECTOMY;  Surgeon: Joyice Faster. Cornett, MD;  Location: [facility];  Service: General;  Laterality: N/A;  . HERNIA REPAIR    . PANENDOSCOPY N/A 03/04/2017   Procedure: PANENDOSCOPY WITH POSSIBLE FOREIGN BODY REMOVAL;  Surgeon: Jodi Marble, MD;  Location: [facility];  Service: ENT;  Laterality: N/A;  . ROTATOR CUFF REPAIR    . TRACHEOSTOMY  02/2017  . VESICOVAGINAL FISTULA CLOSURE W/ TAH  2009     OB History   None      Home Medications    Prior to Admission medications   Medication Sig Start Date End Date Taking? Authorizing Provider  ACCU-CHEK SOFTCLIX LANCETS lancets Use as instructed to check blood sugar once daily. E11.9 06/18/17   [name], MD  acetaminophen-codeine (TYLENOL #3) 300-30 MG tablet Take 1 tablet by mouth at bedtime as needed for moderate pain. 01/18/18   [name], MD  albuterol (PROAIR HFA) 108 (90 Base) MCG/ACT inhaler Inhale 2 puffs into the lungs every 6 (six) hours as needed for wheezing or shortness of breath. 01/18/18   [name], MD  albuterol (PROVENTIL) (2.5 MG/3ML) 0.083% nebulizer solution Take 3  mLs (2.5 mg total) by nebulization every 6 (six) hours as needed for wheezing or shortness of breath. 01/18/18   [name], MD  amLODipine (NORVASC) 10 MG tablet Take 1 tablet (10 mg total) by mouth daily. 01/18/18   [name], MD  Artificial Tear Ointment (ARTIFICIAL TEARS) ointment Place 1 application into both eyes at bedtime.     [provider]  atorvastatin (LIPITOR) 20 MG tablet Take 1 tablet (20 mg total) by mouth daily. 01/18/18   [name], MD  Blood Glucose Monitoring Suppl (ACCU-CHEK AVIVA  PLUS) w/Device KIT Used to check blood sugars once daily. E11.9 06/11/17   [name], MD  budesonide-formoterol (SYMBICORT) 160-4.5 MCG/ACT inhaler Inhale 2 puffs into the lungs 2 (two) times daily. 01/18/18   [name], MD  carvedilol (COREG) 12.5 MG tablet Take 1 tablet (12.5 mg total) by mouth 2 (two) times daily with a meal. 01/18/18   [name], MD  chlorhexidine gluconate, MEDLINE KIT, (PERIDEX) 0.12 % solution 15 mLs by Mouth Rinse route 2 (two) times daily. 03/05/17   Arnell Asal, NP  fluconazole (DIFLUCAN) 150 MG tablet Take 1 tablet (150 mg total) by mouth daily. 02/19/18   [name], MD  gabapentin (NEURONTIN) 300 MG capsule Take 2 capsules (600 mg total) by mouth 2 (two) times daily. 01/18/18   [name], MD  glucose blood (ACCU-CHEK AVIVA) test strip Use as directed to check blood sugars once daily. E11.9 06/11/17   [name], MD  HYDROcodone-homatropine (HYCODAN) 5-1.5 MG/5ML syrup Take 5 mLs by mouth every 6 (six) hours as needed for cough. Patient not taking: Reported on 02/19/2018 01/18/18   [name], MD  Insulin Glargine (BASAGLAR KWIKPEN) 100 UNIT/ML SOPN Inject 1 mL (100 Units total) into the skin every morning. And pen needles 1/day 11/27/17   Renato Shin, MD  Insulin Pen Needle 31G X 5 MM MISC 1 each by Does not apply route at bedtime. 10/15/17   [name], MD  montelukast (SINGULAIR) 10 MG tablet Take 1 tablet (10 mg total) by mouth at bedtime. 01/18/18   [name], MD  pantoprazole (PROTONIX) 40 MG tablet Take 1 tablet (40 mg total) by mouth daily. 01/18/18   [name], MD  sertraline (ZOLOFT) 100 MG tablet Place 1 tablet (100 mg total) into feeding tube daily. 04/28/17   [name], MD  sodium chloride HYPERTONIC 3 % nebulizer solution Take 4 mLs by nebulization 2 (two) times daily. 05/08/17   Aline August, MD  SUMAtriptan (IMITREX) 100 MG tablet At the onset of a migraine; may repeat in 2 hours. Max daily dose 210m  01/18/18   NCharlott Rakes MD  tiZANidine (ZANAFLEX) 4 MG tablet Take 1 tablet (4 mg total) by mouth every 8 (eight) hours as needed for muscle spasms. 01/18/18   NCharlott Rakes MD  topiramate (TOPAMAX) 100 MG tablet Take 1 tablet (100 mg total) by mouth 2 (two) times daily. 01/18/18   NCharlott Rakes MD  fluticasone-salmeterol (ADVAIR HFA) 230-21 MCG/ACT inhaler Inhale 2 puffs into the lungs 2 (two) times daily. 10/15/17 10/15/17  NCharlott Rakes MD    Family History Family History  Problem Relation Age of Onset  . Heart attack Mother   . Stroke Mother   . Diabetes Mother   . Hypertension Mother   . Arthritis Mother   . Stroke Father   . Hypertension Sister   . Diabetes Sister   . Seizures Brother   . Diabetes Brother     Social  History Social History   Tobacco Use  . Smoking status: Former Smoker    Packs/day: 0.25    Years: 22.00    Pack years: 5.50    Types: Cigarettes    Last attempt to quit: 01/20/2017    Years since quitting: 1.1  . Smokeless tobacco: Never Used  Substance Use Topics  . Alcohol use: No    Alcohol/week: 0.6 - 1.2 oz    Types: 1 - 2 Glasses of wine per week    Comment: socially  . Drug use: No     Allergies   Reglan [metoclopramide]   Review of Systems Review of Systems  Constitutional: Positive for fever. Negative for chills.  HENT: Negative for ear pain and sore throat.   Eyes: Negative for pain and visual disturbance.  Respiratory: Positive for chest tightness and shortness of breath. Negative for cough.   Cardiovascular: Negative for chest pain and palpitations.  Gastrointestinal: Negative for abdominal pain and vomiting.  Genitourinary: Negative for dysuria and hematuria.  Musculoskeletal: Negative for arthralgias and back pain.  Skin: Negative for color change and rash.  Neurological: Positive for headaches. Negative for seizures and syncope.  All other systems reviewed and are negative.    Physical Exam Updated Vital Signs BP  (!) 162/111   Pulse 96   Temp 99.4 F (37.4 C) (Oral)   Resp 14   Ht _0  (1.549 m)   Wt 110.9 kg (244 lb 7.8 oz)   SpO2 96%   BMI 46.20 kg/m   Physical Exam  Constitutional: She is oriented to person, place, and time. She appears well-developed and well-nourished. No distress.  HENT:  Head: Normocephalic and atraumatic.  Mouth/Throat: Oropharynx is clear and moist.  Eyes: Conjunctivae and EOM are normal. No scleral icterus.  Neck: Neck supple.  Trach in place with light red tinged mucous  Cardiovascular: Regular rhythm, normal heart sounds and intact distal pulses. Tachycardia present.  No murmur heard. Pulmonary/Chest: Tachypnea noted. No respiratory distress. She has wheezes (expiratory throughout).  Abdominal: Soft. There is no tenderness.  Musculoskeletal: She exhibits no edema (no BLE edema).  Neurological: She is alert and oriented to person, place, and time.  Skin: Skin is warm and dry.  Psychiatric: She has a normal mood and affect.  Nursing note and vitals reviewed.    ED Treatments / Results  Labs (all labs ordered are listed, but only abnormal results are displayed) Labs Reviewed  BASIC METABOLIC PANEL - Abnormal; Notable for the following components:      Result Value   Glucose, Bld 534 (*)    Creatinine, Ser 1.53 (*)    GFR calc non Af Amer 39 (*)    GFR calc Af Amer 45 (*)    All other components within normal limits  CBC WITH DIFFERENTIAL/PLATELET - Abnormal; Notable for the following components:   Lymphs Abs 5.4 (*)    All other components within normal limits  I-STAT CG4 LACTIC ACID, ED - Abnormal; Notable for the following components:   Lactic Acid, Venous 2.15 (*)    All other components within normal limits  CBG MONITORING, ED - Abnormal; Notable for the following components:   Glucose-Capillary 252 (*)    All other components within normal limits  CULTURE, BLOOD (ROUTINE X 2)  CULTURE, BLOOD (ROUTINE X 2)  RESPIRATORY PANEL BY PCR  HIV  ANTIBODY (ROUTINE TESTING)  BASIC METABOLIC PANEL  CBC  LACTIC ACID, PLASMA  I-STAT TROPONIN, ED    EKG EKG  Interpretation  Date/Time:  Friday March 05 2018 16:39:36 EDT Ventricular Rate:  106 PR Interval:    QRS Duration: 92 QT Interval:  337 QTC Calculation: 448 R Axis:   28 Text Interpretation:  Sinus tachycardia Probable left atrial enlargement No significant change since last tracing Confirmed by Lacretia Leigh ([id number]) on 03/05/2018 10:45:49 PM   Radiology Dg Chest 2 View  Result Date: 03/05/2018 CLINICAL DATA:  Chest pain, shortness of breath and cough for 1 week. Fever last night. EXAM: CHEST - 2 VIEW COMPARISON:  Chest x-ray dated 07/02/2017. FINDINGS: Study is hypoinspiratory with crowding of the perihilar and bibasilar bronchovascular markings. Given the low lung volumes, lungs appear clear. No confluent opacity to suggest a developing pneumonia. No pleural effusion or pneumothorax seen. Heart size and mediastinal contours are grossly stable. Tracheostomy tube is in the midline. No acute or suspicious osseous finding. IMPRESSION: No active cardiopulmonary disease. No evidence of pneumonia or pulmonary edema. Electronically Signed   By: Franki Cabot M.D.   On: 03/05/2018 14:20   Ct Angio Chest Pe W/cm &/or Wo Cm  Result Date: 03/05/2018 CLINICAL DATA:  Chest pain for 4 days. EXAM: CT ANGIOGRAPHY CHEST WITH CONTRAST TECHNIQUE: Multidetector CT imaging of the chest was performed using the standard protocol during bolus administration of intravenous contrast. Multiplanar CT image reconstructions and MIPs were obtained to evaluate the vascular anatomy. CONTRAST:  100 ml ISOVUE-370 IOPAMIDOL (ISOVUE-370) INJECTION 76% COMPARISON:  CT chest 06/02/2017, 03/04/2017 and 05/03/2015. FINDINGS: Cardiovascular: Satisfactory opacification of the pulmonary arteries to the segmental level. No evidence of pulmonary embolism. There is cardiomegaly. No pericardial effusion. Mediastinum/Nodes: 1.9 cm  right paratracheal node is unchanged since the prior exams. No new or enlarging lymph nodes identified. Thyroid gland appears normal. No pleural effusion. Mild dependent atelectasis noted. Lungs/Pleura: No pleural effusion. Mild dependent atelectasis noted. Upper Abdomen: Fatty infiltration of liver is seen. Musculoskeletal: No acute or focal bony abnormality. Review of the MIP images confirms the above findings. IMPRESSION: Negative for pulmonary embolus. Cardiomegaly. Fatty infiltration of liver. Electronically Signed   By: Inge Rise M.D.   On: 03/05/2018 18:08    Procedures Procedures (including critical care time)  Medications Ordered in ED Medications  iopamidol (ISOVUE-370) 76 % injection 100 mL (has no administration in time range)  iopamidol (ISOVUE-370) 76 % injection (has no administration in time range)  amLODipine (NORVASC) tablet 10 mg (has no administration in time range)  artificial tears ointment 1 application (has no administration in time range)  atorvastatin (LIPITOR) tablet 20 mg (has no administration in time range)  mometasone-formoterol (DULERA) 200-5 MCG/ACT inhaler 2 puff (has no administration in time range)  carvedilol (COREG) tablet 12.5 mg (has no administration in time range)  gabapentin (NEURONTIN) capsule 600 mg (has no administration in time range)  BASAGLAR KWIKPEN KwikPen 100 Units (has no administration in time range)  montelukast (SINGULAIR) tablet 10 mg (has no administration in time range)  pantoprazole (PROTONIX) EC tablet 40 mg (has no administration in time range)  sertraline (ZOLOFT) tablet 100 mg (has no administration in time range)  sodium chloride HYPERTONIC 3 % nebulizer solution 4 mL (has no administration in time range)  topiramate (TOPAMAX) tablet 100 mg (has no administration in time range)  enoxaparin (LOVENOX) injection 40 mg (has no administration in time range)  sodium chloride flush (NS) 0.9 % injection 3 mL (has no administration  in time range)  0.9 %  sodium chloride infusion (has no administration in time range)  acetaminophen (  TYLENOL) tablet 650 mg (has no administration in time range)    Or  acetaminophen (TYLENOL) suppository 650 mg (has no administration in time range)  polyethylene glycol (MIRALAX / GLYCOLAX) packet 17 g (has no administration in time range)  ondansetron (ZOFRAN) tablet 4 mg (has no administration in time range)    Or  ondansetron (ZOFRAN) injection 4 mg (has no administration in time range)  albuterol (PROVENTIL) (2.5 MG/3ML) 0.083% nebulizer solution 2.5 mg (has no administration in time range)  insulin aspart (novoLOG) injection 0-20 Units (has no administration in time range)  insulin aspart (novoLOG) injection 0-5 Units (has no administration in time range)  azithromycin (ZITHROMAX) tablet 250 mg (has no administration in time range)  ipratropium-albuterol (DUONEB) 0.5-2.5 (3) MG/3ML nebulizer solution 3 mL (3 mLs Nebulization Not Given 03/05/18 2152)  fluticasone (FLONASE) 50 MCG/ACT nasal spray 2 spray (has no administration in time range)  guaiFENesin-codeine 100-10 MG/5ML solution 5 mL (has no administration in time range)  ipratropium-albuterol (DUONEB) 0.5-2.5 (3) MG/3ML nebulizer solution 3 mL (3 mLs Nebulization Given 03/05/18 1341)  sodium chloride 0.9 % bolus 1,000 mL (0 mLs Intravenous Stopped 03/05/18 1710)  methylPREDNISolone sodium succinate (SOLU-MEDROL) 125 mg/2 mL injection 125 mg (125 mg Intravenous Given 03/05/18 1641)  cefTRIAXone (ROCEPHIN) 1 g in sodium chloride 0.9 % 100 mL IVPB (0 g Intravenous Stopped 03/05/18 1710)  azithromycin (ZITHROMAX) 500 mg in sodium chloride 0.9 % 250 mL IVPB (0 mg Intravenous Stopped 03/05/18 1906)  morphine 4 MG/ML injection 4 mg (4 mg Intravenous Given 03/05/18 1640)  iopamidol (ISOVUE-370) 76 % injection 100 mL (100 mLs Intravenous Contrast Given 03/05/18 1727)  morphine 4 MG/ML injection 4 mg (4 mg Intravenous Given 03/05/18 1912)    ipratropium-albuterol (DUONEB) 0.5-2.5 (3) MG/3ML nebulizer solution 3 mL (3 mLs Nebulization Given 03/05/18 1924)     Initial Impression / Assessment and Plan / ED Course  I have reviewed the triage vital signs and the nursing notes.  Pertinent labs & imaging results that were available during my care of the patient were reviewed by me and considered in my medical decision making (see chart for details).     [name] is a 47 y.o. female with PMHx of obesity, asthma s/p trach placement, DM, HTN who p/w dyspnea, productive cough with blood tinged sputum x 1 wk. Reviewed and confirmed nursing documentation for past medical history, family history, social history. VS afebrile, HR 113. Exam remarkable for tachypnea, expiratory wheezes throughout. Ddx includes PNA, URI. Asthma exacerbation likely contributing. Considering PE.    EKG with ST, no acute ischemic changes. NS bolus, IV solumedrol, ceftriaxone, azithromycin givnen. IV morphine given for chest discomfort with coughing. Blood cultures pending. Trop neg. BMP with hyperglycemia 534, normal AG. Cr 1.53 (baseline around 1.1-1.3). CBC with no leukocytosis. Lactic acid elevated at 2.15, suspect 2/2 albuterol. CXR neg.  CTA PE with no PE, cardiomegaly, fatty liver. Only minimally improved after albuterol. Likely URI with asthma exacerbation.   Old records reviewed. Labs reviewed by me and used in the medical decision making.  Imaging viewed and interpreted by me and used in the medical decision making (formal interpretation from radiologist). EKG reviewed by me and used in the medical decision making. Admitted to internal medicine for continued symptomatic treatment.    Final Clinical Impressions(s) / ED Diagnoses   Final diagnoses:  Asthma with acute exacerbation, unspecified asthma severity, unspecified whether persistent  Productive cough    ED Discharge Orders    None  Norm Salt, MD 03/05/18 Ulla Gallo     Lacretia Leigh, MD 03/07/18 [id number]

## 2018-03-05 NOTE — ED Provider Notes (Addendum)
I saw and evaluated the patient, reviewed the resident's note and I agree with the findings and plan.  EKG: EKG Interpretation  Date/Time:  Friday March 05 2018 16:39:36 EDT Ventricular Rate:  106 PR Interval:    QRS Duration: 92 QT Interval:  337 QTC Calculation: 448 R Axis:   28 Text Interpretation:  Sinus tachycardia Probable left atrial enlargement No significant change since last tracing Confirmed by Lacretia Leigh ([id number]) on 03/05/2018 10:45:8 PM    47 year old female with history of asthma with tracheostomy tube presents with several days of increased shortness of breath with increased sputum.  On exam here she has diffuse wheezes.  Chest x-ray without acute findings.  Concern for PE versus pneumonia.  Will order chest CT and patient will require admission   Lacretia Leigh, MD 03/05/18 1525    Lacretia Leigh, MD 03/05/18 2246

## 2018-03-05 NOTE — ED Notes (Signed)
Lab results reported to Nurse Alexa.

## 2018-03-05 NOTE — ED Triage Notes (Signed)
Patient with tracheostomy complains of worsening shortness of breath and congestion for the last week. Patient tachypnic and with productive cough. 98% SpO2 98% on room air, audibly wheezing in triage.

## 2018-03-05 NOTE — H&P (Signed)
Date: 03/05/2018               Patient Name:  [name] MRN: [id number]  DOB: [birth year] Age / Sex: 47 y.o., female   PCP: [name], MD         Medical Service: Internal Medicine Teaching Service         Attending Physician: Dr. Annia Belt, MD    First Contact: Dr. Amado Coe Pager: [phone]  Second Contact: Dr. Einar Gip Pager: [phone]       After Hours (After 5p/  First Contact Pager: [phone]  weekends / holidays): Second Contact Pager: [phone]   Chief Complaint: Shortness of breath  History of Present Illness:  [name] is a 47 yo with PMH of asthma requiring intubation and subsequent tracheostomy in 01/2017 2/2 tracheal stenosis, insulin dependent B5DH complicated by polyneuropathy, HTN, OSA, and chronic back pain who presents for evaluation of progressively worsening shortness of breath. History obtained directly from the patient. Patient was in usual state of health until 7-10 days ago when she first began to notice a productive cough with green sputum. Patient states cough associated with R sided chest pain which wraps around her ribs whenever she coughs. Patient also endorses nasal congestion, with green mucus, and frontal headaches during this time. Patient has tried supportive care of her symptoms with Robitussin and Tylenol, which help for an hour or two at first but symptoms return thereafter. Patient states she usually doesn't have to wear oxygen during the day and only uses humidified oxygen at night for care of chronic tracheostomy, but since symptoms started patient has been increasing albuterol breathing treatments (didn't help) and using intermittent humidified oxygen during the daytime to help with cough/shortness of breath. Symptoms have progressively gotten worse since they started and patient now endorses 3 days of blood tinged sputum, decreased appetite, intermittent fevers to 101F, nausea, general malaise,  insomnia, and shortness of breath. Patient denies abdominal pain, loose bowel movements, lower extremity swelling, dysuria, hematuria, and melena. Patient lives with husband, daughter, and grandchild (age 31) and no one in the house has been sick. However, patient notes that a few weeks ago, prior to onset of symptoms, she visited with her nephew who had a productive cough and was diagnosed with a viral infection.   Upon arrival to the ED patient was afebrile with T max 37.4, tachycardic to 110s, hypertensive to 160s/100s, tachypneic but saturating >90% on room air. CBC with WBC 10.3, Hgb 12.5, and platelets 362. BMP significant for Cr of 1.53 (baseline 1.0-1.2) and glucose of 534. ISTAT troponin 0.00 and initial lactate 2.15. EKG with sinus tachycardia but no signs of ischemia. Chest CXR without signs of acute cardiopulmonary process and CT angiography of the chest without evidence of pulmonary embolism or pneumonia. Blood cultures were obtained and patient was given DuoNebs, IV solumedrol 125 mg, ceftriaxone, and azithromycin. IMTS called for admission.   Outpatient Medications:  Amlodipine Atorvastatin Symbicort Carvedilol Gabapentin Insulin glargine Singulair Pantoprazole Sertraline Hypertonic saline nebulizer Sumatriptan Tizanidine Topiramate  Allergies: Allergies as of 03/05/2018 - Review Complete 03/05/2018  Allergen Reaction Noted  . Reglan [metoclopramide] Other (See Comments) 06/04/2014   Past Medical History: Past Medical History:  Diagnosis Date  . Arthritis   . Asthma   . Chronic back pain   . Chronic chest pain   . Depression   . DM (diabetes mellitus) ([place])    INSULIN DEPENDENT  . GERD (gastroesophageal reflux disease)   .  Headache(784.0)   . Hypertension   . Hypokalemia   . Respiratory disease 05/2017  . Tracheostomy in place [facility]) 02/2017   Family History:  Family History  Problem Relation Age of Onset  . Heart attack Mother   . Stroke Mother   .  Diabetes Mother   . Hypertension Mother   . Arthritis Mother   . Stroke Father   . Hypertension Sister   . Diabetes Sister   . Seizures Brother   . Diabetes Brother    Social History:  Patient lives with husband, daughter, and grandson. Currently on disability. Previous smoker (1/3 ppd x 25 years) but quit last year. Social drinker, no illicit drugs.   Review of Systems: A complete ROS was negative except as per HPI.  Physical Exam: Blood pressure (!) 152/98, pulse (!) 106, temperature 99.4 F (37.4 C), temperature source Oral, resp. rate 15, SpO2 94 %.  Physical Exam  Constitutional: She is oriented to person, place, and time.  HENT:  Tracheostomy with collar in place. Oropharynx moist with posterior erythema. Slight swelling and erythema in nares bilaterally. TM clear with good cone of light bilaterally.  Eyes: Conjunctivae and EOM are normal. Right eye exhibits no discharge. Left eye exhibits no discharge.  Cardiovascular: Regular rhythm, normal heart sounds and intact distal pulses.  No murmur heard. Tachycardia  Respiratory:  Speaking in full sentences. Poor effort with exam 2/2 ongoing cough. Intermittent scattered expiratory wheezing but good air movement throughout. No crackles or consolidation appreciated.  GI: Soft. Bowel sounds are normal. She exhibits no distension. There is no tenderness.  Musculoskeletal: Normal range of motion. She exhibits no edema (of bilateral lower extremities) or tenderness (of bilateral lower extremities).  Lymphadenopathy:    She has no cervical adenopathy (tenderness with palpation).  Neurological: She is alert and oriented to person, place, and time.  Face strength and sensation intact bilaterally. Tongue midline. Gross motor and sensation to light touch of upper and lower extremities intact bilaterally.  Skin: Skin is warm and dry. No rash noted. No erythema.   EKG: personally reviewed my interpretation is sinus tachycardia with normal  intervals, axis. No signs of ST elevation to suggest ongoing ischemia.  CXR: personally reviewed my interpretation is low lung volumes, chronic tracheostomy in place. No acute opacities, vascular congestion, or significant pleural effusions to suggest acute cardiopulmonary process.  Assessment & Plan by Problem: Active Problems:   Asthma exacerbation  Chriselda Bradley is a 47 yo with PMH of asthma requiring intubation and subsequent tracheostomy in 01/2017 2/2 tracheal stenosis, insulin dependent V6HY complicated by polyneuropathy, HTN, OSA, and chronic back pain who presented for evaluation of fever, productive cough, and progressively worsening shortness of breath. Patient was admitted to the internal medicine teaching service for management. The specific problems addressed during admission   Asthma exacerbation 2/2 upper respiratory infection: Patient presenting with week long history of worsening nasal congestion, productive cough with green sputum, fever, malaise, and generalized shortness of breath. Altogether suggestive of upper respiratory infection. This diagnosis is supported by the absence of pneumonia on extensive chest imaging in ED. Patient has complicated history of asthma and reactive upper airway disease, which resulted in intubation and subsequent tracheostomy after development of airway stenosis. It is likely that this underlying asthma/reactive upper airway is being exacerbated by URI, as the ED provider noted significant wheezing on admission that had greatly improved with steroids and breathing treatments at the time of admitting physician's exam. Patient treated with antibiotics for CAP  in ED, but no evidence of lobar pneumonia on imaging to suggest ceftriaxone needed. Suspect viral process, so will obtain RVP, and continue azithromycin to cover for atypical bacterial organisms pending the results of test. Will also treat for asthma exacerbation with breathing treatments and  steroids as outlined below. -Admit to stepdown -Continue azithromycin 250 mg x4 days -Start prednisone 40 mg x4 days -DuoNebs q6 hours, albuterol PRN for wheezing -Trend lactate -F/u RVP -Home hypertonic saline nebulizers BID, Dulera BID, singular 10 mg and Flonase daily -Supportive care for   AKI: Patient with mild elevation to 1.54 from baseline 1.0-1.2 (with eGFR >60). Patient may have CKD at baseline but suspect prerenal given history of decreased appetite and poor PO intake, along with elevated lactate on admission. Patient received 1L NS in ED and will continue with IVF overnight.  -NS @ rate of 75 ml/hr  -Recheck BMP in AM  Insulin dependent W7KT complicated by polyneuropathy: Patient's BG elevated >500 on admission, but decreased to around 250 without intervention. No metabolic abnormalities to suggest DKA at present. Suspect elevation 2/2 administration of steroids in ED, as patient reports taking medications this AM prior to admission. At baseline patient takes only long actin insulin at home. Patient's A1C 11.8% at last endocrinology visit, suggesting poor control on this regimen as outpatient. Will continue with close monitoring, basal/bolus insulin to hospital goal <180.  -CBG monitoring TID, qHS -Home Lantus 100 units  -SSI TID with meals, qHS -Gabapentin 600 mg BID  Hypertension: Patient hypertensive in ED. As an outpatient patient takes amlodoipine 10 mg daily and coreg 12.5 mg BID. Will continue to monitor on this regimen and make adjustments as needed. -Home amlodipine 10 mg, coreg 12.5 mg BID  FEN/GI: -Carb Modified diet -No IVF, replace electrolytes as needed -Protonix 40 mg daily   VTE Prophylaxis: Lovenox daily Code Status: Full  Dispo: Admit patient to Inpatient with expected length of stay greater than 2 midnights.  SignedThomasene Ripple, MD 03/05/2018, 10:31 PM  Pager: [phone]

## 2018-03-06 ENCOUNTER — Encounter (HOSPITAL_COMMUNITY): Payer: Self-pay

## 2018-03-06 ENCOUNTER — Other Ambulatory Visit: Payer: Self-pay

## 2018-03-06 DIAGNOSIS — M549 Dorsalgia, unspecified: Secondary | ICD-10-CM

## 2018-03-06 DIAGNOSIS — J208 Acute bronchitis due to other specified organisms: Secondary | ICD-10-CM

## 2018-03-06 DIAGNOSIS — G8929 Other chronic pain: Secondary | ICD-10-CM

## 2018-03-06 DIAGNOSIS — D72829 Elevated white blood cell count, unspecified: Secondary | ICD-10-CM

## 2018-03-06 DIAGNOSIS — I10 Essential (primary) hypertension: Secondary | ICD-10-CM

## 2018-03-06 DIAGNOSIS — Z79899 Other long term (current) drug therapy: Secondary | ICD-10-CM

## 2018-03-06 DIAGNOSIS — Z794 Long term (current) use of insulin: Secondary | ICD-10-CM

## 2018-03-06 DIAGNOSIS — E1142 Type 2 diabetes mellitus with diabetic polyneuropathy: Secondary | ICD-10-CM

## 2018-03-06 DIAGNOSIS — Z888 Allergy status to other drugs, medicaments and biological substances status: Secondary | ICD-10-CM

## 2018-03-06 DIAGNOSIS — Z8249 Family history of ischemic heart disease and other diseases of the circulatory system: Secondary | ICD-10-CM

## 2018-03-06 DIAGNOSIS — J45901 Unspecified asthma with (acute) exacerbation: Secondary | ICD-10-CM

## 2018-03-06 DIAGNOSIS — E662 Morbid (severe) obesity with alveolar hypoventilation: Secondary | ICD-10-CM

## 2018-03-06 DIAGNOSIS — N179 Acute kidney failure, unspecified: Secondary | ICD-10-CM

## 2018-03-06 DIAGNOSIS — Z87891 Personal history of nicotine dependence: Secondary | ICD-10-CM

## 2018-03-06 DIAGNOSIS — R0603 Acute respiratory distress: Secondary | ICD-10-CM

## 2018-03-06 DIAGNOSIS — Z833 Family history of diabetes mellitus: Secondary | ICD-10-CM

## 2018-03-06 DIAGNOSIS — Z930 Tracheostomy status: Secondary | ICD-10-CM

## 2018-03-06 LAB — BASIC METABOLIC PANEL
Anion gap: 14 (ref 5–15)
Anion gap: 15 (ref 5–15)
BUN: 14 mg/dL (ref 6–20)
BUN: 22 mg/dL — AB (ref 6–20)
CALCIUM: 8.2 mg/dL — AB (ref 8.9–10.3)
CALCIUM: 8.6 mg/dL — AB (ref 8.9–10.3)
CHLORIDE: 99 mmol/L — AB (ref 101–111)
CHLORIDE: 101 mmol/L (ref 101–111)
CO2: 18 mmol/L — ABNORMAL LOW (ref 22–32)
CO2: 17 mmol/L — ABNORMAL LOW (ref 22–32)
CREATININE: 1.3 mg/dL — AB (ref 0.44–1.00)
CREATININE: 1.44 mg/dL — AB (ref 0.44–1.00)
GFR calc non Af Amer: 48 mL/min — ABNORMAL LOW (ref >60)
GFR, EST AFRICAN AMERICAN: 55 mL/min — AB (ref >60)
GFR, EST AFRICAN AMERICAN: 49 mL/min — AB (ref >60)
GFR, EST NON AFRICAN AMERICAN: 42 mL/min — AB (ref >60)
Glucose, Bld: 534 mg/dL (ref 65–99)
Glucose, Bld: 508 mg/dL (ref 65–99)
Potassium: 4.1 mmol/L (ref 3.5–5.1)
Potassium: 4.3 mmol/L (ref 3.5–5.1)
SODIUM: 131 mmol/L — AB (ref 135–145)
SODIUM: 133 mmol/L — AB (ref 135–145)

## 2018-03-06 LAB — GLUCOSE, RANDOM: GLUCOSE: 400 mg/dL — AB (ref 65–99)

## 2018-03-06 LAB — CBG MONITORING, ED
GLUCOSE-CAPILLARY: 481 mg/dL — AB (ref 65–99)
GLUCOSE-CAPILLARY: 394 mg/dL — AB (ref 65–99)
Glucose-Capillary: 495 mg/dL — ABNORMAL HIGH (ref 65–99)
Glucose-Capillary: 444 mg/dL — ABNORMAL HIGH (ref 65–99)
Glucose-Capillary: 429 mg/dL — ABNORMAL HIGH (ref 65–99)

## 2018-03-06 LAB — URINALYSIS, ROUTINE W REFLEX MICROSCOPIC
Bacteria, UA: NONE SEEN
Bilirubin Urine: NEGATIVE
Hgb urine dipstick: NEGATIVE
Ketones, ur: NEGATIVE mg/dL
Leukocytes, UA: NEGATIVE
Nitrite: NEGATIVE
PROTEIN: NEGATIVE mg/dL
SPECIFIC GRAVITY, URINE: 1.03 (ref 1.005–1.030)
pH: 6 (ref 5.0–8.0)

## 2018-03-06 LAB — RESPIRATORY PANEL BY PCR
ADENOVIRUS-RVPPCR: NOT DETECTED
Bordetella pertussis: NOT DETECTED
CORONAVIRUS HKU1-RVPPCR: NOT DETECTED
CORONAVIRUS NL63-RVPPCR: NOT DETECTED
CORONAVIRUS OC43-RVPPCR: NOT DETECTED
Chlamydophila pneumoniae: NOT DETECTED
Coronavirus 229E: NOT DETECTED
Influenza A: NOT DETECTED
Influenza B: NOT DETECTED
METAPNEUMOVIRUS-RVPPCR: NOT DETECTED
Mycoplasma pneumoniae: NOT DETECTED
PARAINFLUENZA VIRUS 1-RVPPCR: NOT DETECTED
PARAINFLUENZA VIRUS 2-RVPPCR: NOT DETECTED
PARAINFLUENZA VIRUS 3-RVPPCR: NOT DETECTED
Parainfluenza Virus 4: NOT DETECTED
RHINOVIRUS / ENTEROVIRUS - RVPPCR: NOT DETECTED
Respiratory Syncytial Virus: NOT DETECTED

## 2018-03-06 LAB — CBC
HCT: 36.8 % (ref 36.0–46.0)
Hemoglobin: 11.9 g/dL — ABNORMAL LOW (ref 12.0–15.0)
MCH: 27.9 pg (ref 26.0–34.0)
MCHC: 32.3 g/dL (ref 30.0–36.0)
MCV: 86.2 fL (ref 78.0–100.0)
PLATELETS: 323 10*3/uL (ref 150–400)
RBC: 4.27 MIL/uL (ref 3.87–5.11)
RDW: 13.7 % (ref 11.5–15.5)
WBC: 11 10*3/uL — ABNORMAL HIGH (ref 4.0–10.5)

## 2018-03-06 LAB — MRSA PCR SCREENING: MRSA by PCR: POSITIVE — AB

## 2018-03-06 LAB — GLUCOSE, CAPILLARY
GLUCOSE-CAPILLARY: 425 mg/dL — AB (ref 65–99)
Glucose-Capillary: 450 mg/dL — ABNORMAL HIGH (ref 65–99)

## 2018-03-06 LAB — HIV ANTIBODY (ROUTINE TESTING W REFLEX): HIV Screen 4th Generation wRfx: NONREACTIVE

## 2018-03-06 LAB — LACTIC ACID, PLASMA
LACTIC ACID, VENOUS: 3.6 mmol/L — AB (ref 0.5–1.9)
LACTIC ACID, VENOUS: 2.7 mmol/L — AB (ref 0.5–1.9)
Lactic Acid, Venous: 2.7 mmol/L (ref 0.5–1.9)

## 2018-03-06 MED ORDER — PHENOL 1.4 % MT LIQD: 1.0000 | OROMUCOSAL | Status: DC | PRN

## 2018-03-06 MED ORDER — INSULIN GLARGINE 100 UNIT/ML ~~LOC~~ SOLN
100.0000 [IU] | Freq: Every day | SUBCUTANEOUS | Status: DC
  Administered 2018-03-06: 100 [IU] via SUBCUTANEOUS
  Filled 2018-03-06 (×2): qty 1

## 2018-03-06 MED ORDER — INSULIN ASPART 100 UNIT/ML ~~LOC~~ SOLN
8.0000 [IU] | Freq: Once | SUBCUTANEOUS | Status: AC
  Administered 2018-03-06: 8 [IU] via SUBCUTANEOUS
  Filled 2018-03-06: qty 1

## 2018-03-06 MED ORDER — SODIUM CHLORIDE 0.9 % IV BOLUS
500.0000 mL | Freq: Once | INTRAVENOUS | Status: AC
Start: 2018-03-06 — End: 2018-03-06
  Administered 2018-03-06: 500 mL via INTRAVENOUS

## 2018-03-06 MED ORDER — KETOROLAC TROMETHAMINE 15 MG/ML IJ SOLN
15.0000 mg | Freq: Once | INTRAMUSCULAR | Status: AC
  Administered 2018-03-06: 15 mg via INTRAVENOUS
  Filled 2018-03-06: qty 1

## 2018-03-06 MED ORDER — BENZONATATE 100 MG PO CAPS
200.0000 mg | ORAL_CAPSULE | Freq: Three times a day (TID) | ORAL | Status: DC | PRN
  Administered 2018-03-08: 200 mg via ORAL
  Filled 2018-03-06: qty 2

## 2018-03-06 MED ORDER — SODIUM CHLORIDE 0.9 % IV BOLUS
500.0000 mL | Freq: Once | INTRAVENOUS | Status: AC
  Administered 2018-03-06: 500 mL via INTRAVENOUS

## 2018-03-06 MED ORDER — GUAIFENESIN-CODEINE 100-10 MG/5ML PO SOLN
5.0000 mL | ORAL | Status: DC | PRN
  Administered 2018-03-06: 5 mL via ORAL
  Filled 2018-03-06: qty 5

## 2018-03-06 MED ORDER — INSULIN ASPART 100 UNIT/ML ~~LOC~~ SOLN
15.0000 [IU] | Freq: Once | SUBCUTANEOUS | Status: AC
  Administered 2018-03-06: 15 [IU] via SUBCUTANEOUS
  Filled 2018-03-06: qty 1

## 2018-03-06 MED ORDER — INSULIN REGULAR BOLUS VIA INFUSION
0.0000 [IU] | Freq: Three times a day (TID) | INTRAVENOUS | Status: DC
  Filled 2018-03-06: qty 10

## 2018-03-06 MED ORDER — INSULIN REGULAR HUMAN 100 UNIT/ML IJ SOLN
INTRAMUSCULAR | Status: DC
  Administered 2018-03-07: 11.5 [IU]/h via INTRAVENOUS
  Administered 2018-03-07: 2.5 [IU]/h via INTRAVENOUS
  Filled 2018-03-06 (×3): qty 1

## 2018-03-06 MED ORDER — DEXTROSE 50 % IV SOLN
25.0000 mL | INTRAVENOUS | Status: DC | PRN
Start: 2018-03-06 — End: 2018-03-08

## 2018-03-06 MED ORDER — SODIUM CHLORIDE 0.9 % IV BOLUS: 500.0000 mL | Freq: Once | INTRAVENOUS | Status: DC

## 2018-03-06 MED ORDER — ARTIFICIAL TEARS OPHTHALMIC OINT
TOPICAL_OINTMENT | Freq: Every day | OPHTHALMIC | Status: DC
  Filled 2018-03-06: qty 3.5

## 2018-03-06 MED ORDER — METHYLPREDNISOLONE SODIUM SUCC 125 MG IJ SOLR
125.0000 mg | Freq: Every day | INTRAMUSCULAR | Status: AC
  Administered 2018-03-06 – 2018-03-07 (×2): 125 mg via INTRAVENOUS
  Filled 2018-03-06 (×2): qty 2

## 2018-03-06 MED ORDER — INSULIN ASPART 100 UNIT/ML ~~LOC~~ SOLN
15.0000 [IU] | Freq: Once | SUBCUTANEOUS | Status: AC
  Administered 2018-03-06: 15 [IU] via SUBCUTANEOUS

## 2018-03-06 MED ORDER — SALINE SPRAY 0.65 % NA SOLN
2.0000 | NASAL | Status: DC | PRN
Start: 2018-03-06 — End: 2018-03-12

## 2018-03-06 MED ORDER — REFRESH P.M. OP OINT: 1.0000 "application " | TOPICAL_OINTMENT | Freq: Every day | OPHTHALMIC | Status: DC

## 2018-03-06 MED ORDER — ALBUTEROL SULFATE (2.5 MG/3ML) 0.083% IN NEBU: 2.5000 mg | INHALATION_SOLUTION | RESPIRATORY_TRACT | Status: AC

## 2018-03-06 MED ORDER — CAPSAICIN 0.025 % EX CREA
TOPICAL_CREAM | Freq: Two times a day (BID) | CUTANEOUS | Status: DC
  Administered 2018-03-07 – 2018-03-11 (×5): via TOPICAL
  Filled 2018-03-06: qty 60

## 2018-03-06 MED ORDER — DEXTROSE-NACL 5-0.45 % IV SOLN
INTRAVENOUS | Status: DC
  Administered 2018-03-07: 05:00:00 via INTRAVENOUS

## 2018-03-06 MED ORDER — SODIUM CHLORIDE 0.9 % IV BOLUS
1000.0000 mL | Freq: Once | INTRAVENOUS | Status: AC
  Administered 2018-03-06: 1000 mL via INTRAVENOUS

## 2018-03-06 MED ORDER — SODIUM CHLORIDE 0.9 % IV SOLN: INTRAVENOUS | Status: DC

## 2018-03-06 MED ORDER — MORPHINE SULFATE (PF) 4 MG/ML IV SOLN
2.0000 mg | Freq: Once | INTRAVENOUS | Status: AC
  Administered 2018-03-06: 2 mg via INTRAVENOUS
  Filled 2018-03-06: qty 1

## 2018-03-06 NOTE — ED Notes (Signed)
CBG 481 

## 2018-03-06 NOTE — Progress Notes (Signed)
Subjective:  No acute events overnight. Patient reports mild improvement in respiratory status. Continues to cough. Tearful during our encounter.   Objective:  Vital signs in last 24 hours: Vitals:   03/06/18 0830 03/06/18 0900 03/06/18 0930 03/06/18 1000  BP: (!) 187/106 (!) 175/109 (!) 169/112 (!) 163/126  Pulse: 100 98 (!) 103 (!) 102  Resp: 20 17 18  (!) 21  Temp:      TempSrc:      SpO2: 100% 97% 100% 100%  Weight:      Height:       Physical Exam  Constitutional: She is oriented to person, place, and time.  Morbidly obese female laying in bed. Appears uncomfortable but in no acute distress   HENT:  Dry mucus membranes   Eyes: Pupils are equal, round, and reactive to light. Conjunctivae are normal.  Cardiovascular:  Tachycardic, nl S1/S2, no mrg   Pulmonary/Chest:  Decreased air movement with mild expiratory wheezes in the upper lung fields. Able to speak in full sentences without increased work of breathing while on 5L of supplemental oxygen. Significant upper expiratory wheezing (history of tracheal stenosis s/p tracheostomy)  Abdominal: Soft. Bowel sounds are normal. She exhibits no distension. There is no tenderness.  Abdomen is obese, otherwise exam is benign   Musculoskeletal: She exhibits no edema.  Neurological: She is alert and oriented to person, place, and time.  Able to move all extremities spontaneously. No focal deficits noted.     Assessment/Plan:  Principal Problem:   Asthma exacerbation Active Problems:   Essential hypertension   Type 2 diabetes mellitus with diabetic polyneuropathy, without long-term current use of insulin (HCC)   AKI (acute kidney injury) ([place])  # Asthma exacerbation likely 2/2 upper respiratory infection: Reports feeling somewhat better this morning. Cough improved. Oxygenating well on 4L of supplemental oxygen. She does appear sick on exam and has decreased air movement and mild expiratory wheezes on exam. Did not significant  upper airway wheezing as well. RVP negative and only mild leukocytosis on CBC that is likely secondary to steroid therapy. Will continue antibiotic therapy as well as IV solumedrol. May transition to PO prednisone tomorrow or the day after if clinical improvement in respiratory status.  - Continue azithromycin 250 mg x3 days - Will continue Solumedrol for today and likely tomorrow  - DuoNebs q6 hours and albuterol PRN for wheezing - Continue to trend lactate, giving 1L NS bolus  - Home hypertonic saline nebulizers BID, Dulera BID, singular 10 mg and Flonase daily - Supportive care for cough   # AKI: No history of CKD. Renal function improving after IV fluid hydration.   - NS @ rate of 75 ml/hr  - 1L NS bolus ordered - Will continue to monitor renal function   # Insulin dependent Y6TK complicated by polyneuropathy: CBG remains elevated in the setting of steroid therapy for asthma exacerbation. AM labs now with new metabolic acidosis with bicarb 18 and CBG 534.  - Ordered 1L NS bolus and giving additional 15 units of Novolog. Might need insulin gtt for optimal glycemic control if still requiring IV Solumedrol. Repeat BMP in PM.  - Continue home Lantus 100 units  - SSI-R TID with meals and qHS - CBG monitoring TID and qHS - Continue home gabapentin 600 mg BID - CM diet   # Hypertension:  -Home amlodipine 10 mg, coreg 12.5 mg BID   Dispo: Anticipated discharge in approximately 2-4 day(s) pending improvement in respiratory status.   Santos-Sanchez, Cana,  MD 03/06/2018, 10:49 AM Pager: [phone]

## 2018-03-06 NOTE — ED Notes (Signed)
RT to bedside

## 2018-03-06 NOTE — ED Notes (Signed)
Admitting MD paged with CBG result

## 2018-03-06 NOTE — ED Notes (Signed)
Diet tray ordered 

## 2018-03-07 DIAGNOSIS — J069 Acute upper respiratory infection, unspecified: Secondary | ICD-10-CM

## 2018-03-07 DIAGNOSIS — E1165 Type 2 diabetes mellitus with hyperglycemia: Secondary | ICD-10-CM

## 2018-03-07 DIAGNOSIS — E872 Acidosis: Secondary | ICD-10-CM

## 2018-03-07 LAB — BASIC METABOLIC PANEL
ANION GAP: 8 (ref 5–15)
Anion gap: 9 (ref 5–15)
BUN: 18 mg/dL (ref 6–20)
BUN: 20 mg/dL (ref 6–20)
CHLORIDE: 110 mmol/L (ref 101–111)
CHLORIDE: 105 mmol/L (ref 101–111)
CO2: 19 mmol/L — ABNORMAL LOW (ref 22–32)
CO2: 19 mmol/L — ABNORMAL LOW (ref 22–32)
Calcium: 8.6 mg/dL — ABNORMAL LOW (ref 8.9–10.3)
Calcium: 8.5 mg/dL — ABNORMAL LOW (ref 8.9–10.3)
Creatinine, Ser: 0.96 mg/dL (ref 0.44–1.00)
Creatinine, Ser: 1.22 mg/dL — ABNORMAL HIGH (ref 0.44–1.00)
GFR calc Af Amer: >60 mL/min (ref >60)
GFR calc Af Amer: 60 mL/min — ABNORMAL LOW (ref >60)
GFR calc non Af Amer: >60 mL/min (ref >60)
GFR, EST NON AFRICAN AMERICAN: 52 mL/min — AB (ref >60)
GLUCOSE: 241 mg/dL — AB (ref 65–99)
GLUCOSE: 308 mg/dL — AB (ref 65–99)
POTASSIUM: 4.1 mmol/L (ref 3.5–5.1)
POTASSIUM: 3.8 mmol/L (ref 3.5–5.1)
Sodium: 137 mmol/L (ref 135–145)
Sodium: 133 mmol/L — ABNORMAL LOW (ref 135–145)

## 2018-03-07 LAB — GLUCOSE, CAPILLARY
GLUCOSE-CAPILLARY: 168 mg/dL — AB (ref 65–99)
GLUCOSE-CAPILLARY: 140 mg/dL — AB (ref 65–99)
GLUCOSE-CAPILLARY: 258 mg/dL — AB (ref 65–99)
GLUCOSE-CAPILLARY: 294 mg/dL — AB (ref 65–99)
GLUCOSE-CAPILLARY: 204 mg/dL — AB (ref 65–99)
Glucose-Capillary: 306 mg/dL — ABNORMAL HIGH (ref 65–99)
Glucose-Capillary: 313 mg/dL — ABNORMAL HIGH (ref 65–99)
Glucose-Capillary: 287 mg/dL — ABNORMAL HIGH (ref 65–99)
Glucose-Capillary: 234 mg/dL — ABNORMAL HIGH (ref 65–99)
Glucose-Capillary: 222 mg/dL — ABNORMAL HIGH (ref 65–99)
Glucose-Capillary: 202 mg/dL — ABNORMAL HIGH (ref 65–99)
Glucose-Capillary: 126 mg/dL — ABNORMAL HIGH (ref 65–99)
Glucose-Capillary: 211 mg/dL — ABNORMAL HIGH (ref 65–99)
Glucose-Capillary: 222 mg/dL — ABNORMAL HIGH (ref 65–99)
Glucose-Capillary: 294 mg/dL — ABNORMAL HIGH (ref 65–99)
Glucose-Capillary: 336 mg/dL — ABNORMAL HIGH (ref 65–99)

## 2018-03-07 LAB — CBC
HEMATOCRIT: 36.1 % (ref 36.0–46.0)
Hemoglobin: 12 g/dL (ref 12.0–15.0)
MCH: 27.9 pg (ref 26.0–34.0)
MCHC: 33.2 g/dL (ref 30.0–36.0)
MCV: 84 fL (ref 78.0–100.0)
Platelets: 235 10*3/uL (ref 150–400)
RBC: 4.3 MIL/uL (ref 3.87–5.11)
RDW: 14 % (ref 11.5–15.5)
WBC: 15.7 10*3/uL — AB (ref 4.0–10.5)

## 2018-03-07 MED ORDER — SODIUM CHLORIDE 3 % IN NEBU
4.0000 mL | INHALATION_SOLUTION | Freq: Two times a day (BID) | RESPIRATORY_TRACT | Status: AC
  Administered 2018-03-07 – 2018-03-08 (×2): 4 mL via RESPIRATORY_TRACT
  Filled 2018-03-07 (×2): qty 4

## 2018-03-07 MED ORDER — BUDESONIDE 0.5 MG/2ML IN SUSP
0.5000 mg | Freq: Two times a day (BID) | RESPIRATORY_TRACT | Status: DC
  Administered 2018-03-07 – 2018-03-11 (×9): 0.5 mg via RESPIRATORY_TRACT
  Filled 2018-03-07 (×9): qty 2

## 2018-03-07 MED ORDER — BUDESONIDE 0.5 MG/2ML IN SUSP: 0.5000 mg | Freq: Two times a day (BID) | RESPIRATORY_TRACT | Status: DC

## 2018-03-07 MED ORDER — GUAIFENESIN-CODEINE 100-10 MG/5ML PO SOLN
5.0000 mL | Freq: Four times a day (QID) | ORAL | Status: DC
  Administered 2018-03-07 – 2018-03-08 (×4): 5 mL via ORAL
  Filled 2018-03-07 (×4): qty 5

## 2018-03-07 MED ORDER — INSULIN REGULAR BOLUS VIA INFUSION
0.0000 [IU] | Freq: Three times a day (TID) | INTRAVENOUS | Status: DC
  Administered 2018-03-07: 5.3 [IU] via INTRAVENOUS
  Administered 2018-03-07: 3.4 [IU] via INTRAVENOUS
  Administered 2018-03-07: 0.9 [IU] via INTRAVENOUS
  Filled 2018-03-07: qty 10

## 2018-03-07 NOTE — Progress Notes (Addendum)
   Subjective:  Still with persistent cough but wheezing improved.  Started on insulin infusion overnight due to persistently significantly elevated blood sugar and new acidosis (high dose steroids; patient brought in fast food).   Objective:  Vital signs in last 24 hours: Vitals:   03/07/18 0700 03/07/18 0732 03/07/18 0733 03/07/18 0752  BP:    (!) 152/108  Pulse:   95 93  Resp:   16 18  Temp:    98.3 F (36.8 C)  TempSrc:    Oral  SpO2: 99% 100% 100% 99%  Weight:      Height:       Physical Exam  Constitutional:  Morbidly obese female laying in bed. Dyspneic with frequent cough.   HENT:  Head: Normocephalic and atraumatic.  =  Eyes: Pupils are equal, round, and reactive to light. Conjunctivae are normal.  Neck:  Trach collar in place; no exudate.   Cardiovascular: Normal rate, regular rhythm and intact distal pulses.  Pulmonary/Chest: She has wheezes.  Decreased air movement with mild expiratory wheezes in the upper lung fields.   Abdominal: Soft. Bowel sounds are normal. She exhibits no distension. There is no tenderness.  Abdomen is obese, otherwise exam is benign   Musculoskeletal: She exhibits no edema.  Neurological: She is alert.  Able to move all extremities spontaneously. No focal deficits noted.   Skin: Skin is warm and dry.    Assessment/Plan:  Principal Problem:   Asthma exacerbation Active Problems:   Essential hypertension   Type 2 diabetes mellitus with diabetic polyneuropathy, without long-term current use of insulin (HCC)   AKI (acute kidney injury) ([place])  # Asthma exacerbation likely 2/2 upper respiratory infection: Continues to feel somewhat better however still with persistent cough. On FiO2 21% via trach collar and saturating well. Still with diffuse wheezing and dyspnea but overall looks improved. Not quite out of the water yet, believe she would benefit from continued high-dose steroids given her history of complicated asthma exacerbations who  is now trach dependent. Managing steroid-related hyperglycemia as noted below.   - Will schedule codeine syrup for cough for now; RN please notify MD if not helpful - Continue azithromycin 250 mg x2 more days - Will continue Solumedrol for today; likely transition to PO tomorrow - DuoNebs q6 hours and albuterol PRN for wheezing - Home hypertonic saline nebulizers BID, Dulera BID, singular 10 mg and Flonase daily - Supportive care for cough   # AKI: No history of CKD. AKI resolved with IVF. Will continue IVF; still with insensible losses due to asthma exacerbation. Need to monitor volume status closely.   # Insulin dependent Q8GN complicated by polyneuropathy: She was started on an insulin infusion overnight due to persistent hyperglycemia and development metabolic acidosis with increasing anion gap. CBGs now in the 200's and CO2 improving. Per RN patients family has been bringing in a lot of fast food, which is most certainly not helping especially in the setting of high-dose steroids required for her asthma exacerbation.  -Encouraged compliance with HH/Carb mod diet -Continue insulin infusion for now with close monitoring -Transition off infusion as able -Gabapentin 600mg  BID  # Hypertension: Home amlodipine 10 mg, coreg 12.5 mg BID continued. Hypertensive today; consider hydral prn if she were to remain persistently hypertensive.   Dispo: Anticipated discharge in approximately 2-4 day(s) pending improvement in respiratory status.   Miroslav Gin, DO 03/07/2018, 7:05 AM Pager: [phone]

## 2018-03-07 NOTE — Plan of Care (Signed)
Discussed with patient and family plan of care, pain management and eating outside food from hospital with such increasing blood sugars with some teach back displayed

## 2018-03-07 NOTE — Progress Notes (Signed)
Medicine attending: I examined this patient today and I concur with the evaluation and management plan as recorded by resident physician Dr.Bethany Molt. She is still experiencing significant respiratory distress which occurs primarily during repetitive bouts of coughing. Now with diffuse coarse rhonchi over both lungs. I think she might benefit from more aggressive anti-tussive's.  I would consider temporary use of small doses of morphine or codeine to suppress her cough. Continue steroids and antibiotics. Sugars in reasonable range despite use of parenteral steroids.

## 2018-03-08 DIAGNOSIS — Z7951 Long term (current) use of inhaled steroids: Secondary | ICD-10-CM

## 2018-03-08 DIAGNOSIS — R51 Headache: Secondary | ICD-10-CM

## 2018-03-08 LAB — BASIC METABOLIC PANEL
Anion gap: 7 (ref 5–15)
BUN: 17 mg/dL (ref 6–20)
CALCIUM: 8.4 mg/dL — AB (ref 8.9–10.3)
CO2: 21 mmol/L — AB (ref 22–32)
CREATININE: 1.07 mg/dL — AB (ref 0.44–1.00)
Chloride: 110 mmol/L (ref 101–111)
GFR calc non Af Amer: >60 mL/min (ref >60)
Glucose, Bld: 148 mg/dL — ABNORMAL HIGH (ref 65–99)
Potassium: 3.5 mmol/L (ref 3.5–5.1)
SODIUM: 138 mmol/L (ref 135–145)

## 2018-03-08 LAB — GLUCOSE, CAPILLARY
GLUCOSE-CAPILLARY: 115 mg/dL — AB (ref 65–99)
GLUCOSE-CAPILLARY: 125 mg/dL — AB (ref 65–99)
GLUCOSE-CAPILLARY: 144 mg/dL — AB (ref 65–99)
GLUCOSE-CAPILLARY: 202 mg/dL — AB (ref 65–99)
Glucose-Capillary: 125 mg/dL — ABNORMAL HIGH (ref 65–99)
Glucose-Capillary: 134 mg/dL — ABNORMAL HIGH (ref 65–99)
Glucose-Capillary: 194 mg/dL — ABNORMAL HIGH (ref 65–99)
Glucose-Capillary: 229 mg/dL — ABNORMAL HIGH (ref 65–99)
Glucose-Capillary: 239 mg/dL — ABNORMAL HIGH (ref 65–99)

## 2018-03-08 MED ORDER — INSULIN GLARGINE 100 UNIT/ML ~~LOC~~ SOLN: 100.0000 [IU] | Freq: Every day | SUBCUTANEOUS | Status: DC

## 2018-03-08 MED ORDER — INSULIN ASPART 100 UNIT/ML ~~LOC~~ SOLN
0.0000 [IU] | Freq: Three times a day (TID) | SUBCUTANEOUS | Status: DC
  Administered 2018-03-08: 4 [IU] via SUBCUTANEOUS
  Administered 2018-03-08: 3 [IU] via SUBCUTANEOUS
  Administered 2018-03-08: 7 [IU] via SUBCUTANEOUS
  Administered 2018-03-09: 15 [IU] via SUBCUTANEOUS
  Administered 2018-03-09: 4 [IU] via SUBCUTANEOUS
  Administered 2018-03-10: 7 [IU] via SUBCUTANEOUS
  Administered 2018-03-10: 4 [IU] via SUBCUTANEOUS
  Administered 2018-03-11: 20 [IU] via SUBCUTANEOUS
  Administered 2018-03-11: 11 [IU] via SUBCUTANEOUS

## 2018-03-08 MED ORDER — IPRATROPIUM-ALBUTEROL 0.5-2.5 (3) MG/3ML IN SOLN
3.0000 mL | Freq: Three times a day (TID) | RESPIRATORY_TRACT | Status: DC
  Administered 2018-03-08 – 2018-03-11 (×12): 3 mL via RESPIRATORY_TRACT
  Filled 2018-03-08 (×12): qty 3

## 2018-03-08 MED ORDER — IBUPROFEN 600 MG PO TABS
600.0000 mg | ORAL_TABLET | Freq: Four times a day (QID) | ORAL | Status: DC | PRN
  Administered 2018-03-08 – 2018-03-11 (×5): 600 mg via ORAL
  Filled 2018-03-08 (×5): qty 1

## 2018-03-08 MED ORDER — INSULIN ASPART 100 UNIT/ML ~~LOC~~ SOLN
0.0000 [IU] | Freq: Every day | SUBCUTANEOUS | Status: DC
  Administered 2018-03-08: 2 [IU] via SUBCUTANEOUS
  Administered 2018-03-09 – 2018-03-10 (×2): 3 [IU] via SUBCUTANEOUS

## 2018-03-08 MED ORDER — INSULIN GLARGINE 100 UNIT/ML ~~LOC~~ SOLN
100.0000 [IU] | Freq: Every day | SUBCUTANEOUS | Status: DC
  Administered 2018-03-08 – 2018-03-11 (×4): 100 [IU] via SUBCUTANEOUS
  Filled 2018-03-08 (×4): qty 1

## 2018-03-08 MED ORDER — INSULIN ASPART 100 UNIT/ML ~~LOC~~ SOLN
5.0000 [IU] | Freq: Three times a day (TID) | SUBCUTANEOUS | Status: DC
  Administered 2018-03-08 (×3): 5 [IU] via SUBCUTANEOUS

## 2018-03-08 MED ORDER — GUAIFENESIN-CODEINE 100-10 MG/5ML PO SOLN
5.0000 mL | ORAL | Status: DC
Start: 2018-03-08 — End: 2018-03-12
  Administered 2018-03-08 – 2018-03-11 (×17): 5 mL via ORAL
  Filled 2018-03-08 (×18): qty 5

## 2018-03-08 MED ORDER — ENOXAPARIN SODIUM 60 MG/0.6ML ~~LOC~~ SOLN
60.0000 mg | SUBCUTANEOUS | Status: DC
  Administered 2018-03-08 – 2018-03-11 (×4): 60 mg via SUBCUTANEOUS
  Filled 2018-03-08 (×4): qty 0.6

## 2018-03-08 MED ORDER — SODIUM CHLORIDE 0.9 % IV SOLN
INTRAVENOUS | Status: DC
  Administered 2018-03-08 – 2018-03-11 (×8): via INTRAVENOUS

## 2018-03-08 MED ORDER — PREDNISONE 20 MG PO TABS
40.0000 mg | ORAL_TABLET | Freq: Every day | ORAL | Status: DC
Start: 2018-03-08 — End: 2018-03-12
  Administered 2018-03-08 – 2018-03-11 (×4): 40 mg via ORAL
  Filled 2018-03-08 (×5): qty 2

## 2018-03-08 NOTE — Plan of Care (Signed)
Discussed with patient plan of care and medications with some teach back displayed.  Patient is refusing to take her topamax medication because of side effects but is needing something else for her headaches.  Advised patient to discuss with provider in the morning.

## 2018-03-08 NOTE — Progress Notes (Addendum)
Noted that patient was on IV insulin drip.  Spoke with staff RN recommending starting the Lantus 100 units now, since that would follow protocol, instead of waiting until bedtime. Patient is to receive Novolog RESISTANT correction scale TID & HS and Novolog 5 units TID with meals. Will continue to monitor blood sugars while in the hospital.  Harvel Ricks RN BSN CDE Diabetes Coordinator Pager: [phone]  8am-5pm

## 2018-03-08 NOTE — Progress Notes (Signed)
   Subjective:  No acute events overnight. Patient feeling better than yesterday. Continues to complain of productive cough. Also complaining of headache. States appetite is decreased, but eating all her meals as well as fast food.   Objective:  Vital signs in last 24 hours: Vitals:   03/08/18 0600 03/08/18 0730 03/08/18 0731 03/08/18 0802  BP:  (!) 151/101  (!) 163/108  Pulse:  74    Resp:  16  17  Temp:    98.1 F (36.7 C)  TempSrc:    Oral  SpO2: 97% 97% 98% 94%  Weight:      Height:       Physical Exam  Constitutional: She is oriented to person, place, and time.  Morbidly obese female laying in bed in no acute distress   Cardiovascular:  Distant heart sounds due to body habitus, but non-tachycardic, nl S1/S2, no mrg   Pulmonary/Chest:  Diffuse coarse breath sounds, no crackles or wheezes, no increased work of breathing while on FiO2 21  Abdominal: Soft. Bowel sounds are normal. She exhibits no distension. There is no tenderness.  Musculoskeletal: She exhibits no edema.  Neurological: She is alert and oriented to person, place, and time.    Assessment/Plan:  Principal Problem:   Asthma exacerbation Active Problems:   Essential hypertension   Type 2 diabetes mellitus with diabetic polyneuropathy, without long-term current use of insulin (HCC)   Productive cough   AKI (acute kidney injury) ([place])  # Asthma exacerbation likely 2/2 upper respiratory infection:Continues to feel somewhat better however still with persistent cough. On FiO2 21% via trach collar and saturating well. Diffuse coarse breath sounds, but no wheezing on lung exam. Will discontinue antibiotic therapy and continue steroid therapy and supportive care as below.  - Discontinue azithromycin  - Increase frequency of codeine syrup for cough  - Transition to prednisone 40 mg daily. Will likely need long taper.  - DuoNebs q6 hours and albuterol PRN for wheezing - Home hypertonic saline nebulizers BID, Dulera  BID, singular 10 mg and Flonase daily - Supportive care for cough   # AKI: No history of CKD. AKI resolved with IVF. Will continue IVF; still with insensible losses due to asthma exacerbation. Need to monitor volume status closely.   # Insulin dependent D8YM complicated by polyneuropathy: Now off insulin gtt with CBGs in the 100s. Will switch to oral steroids today and restart home Lantus as well as insulin regimen as below. BMP today with bicarb 21 and AG 7. Will continue to monitor. May need to restart insulin gtt if continues to be severely hyperglycemic.  - Resume home Lantus 100U QHS - Novolog 5U TID with meals + SSI-R + CBG monitoring  - Encourage compliance with HH/Carb mod diet - Continue home Gabapentin 600mg  BID  # Hypertension:  - Home amlodipine 10 mg and coreg 12.5 mg BID  - Consider hydralazine prn if she were to remain persistently hypertensive    Dispo: Anticipated discharge in approximately 1-3 day(s) pending improvement in respiratory status.   Welford Roche, MD 03/08/2018, 9:57 AM Pager: [phone]

## 2018-03-08 NOTE — Progress Notes (Signed)
Internal Medicine Attending:   I saw and examined the patient. I reviewed the resident's note and I agree with the resident's findings and plan as documented in the resident's note.  Patient states that her shortness of breath is slowly improving but continues to have a productive cough.  Patient was initially admitted with an acute asthma exacerbation likely secondary to an upper respiratory infection.  She continues to have bilateral diffuse coarse breath sounds on auscultation but no wheeze currently.  Will discontinue antibiotic therapy at this time.  We will transition the patient to oral prednisone 40 mg with a slow taper.  Continue with duo nebs every 6 hours and albuterol as needed.  We will continue with guaifenesin with codeine for her persistent cough.  We will monitor her closely for now.

## 2018-03-08 NOTE — Consult Note (Addendum)
   [facility] CM Inpatient Consult   03/08/2018  [name] 04-11-1971 [id number]    Patient screened for potential [name] program due to risk of unplanned readmission score of 26% (high). She was active with [name] back in 2018. Showing active with [name]. However, she is not currently active with [name]. Last encounter with [name] is in 2018.  Spoke with patient at bedside at bedside to discuss [name] program. She pleasantly declines [name] program. However, she is agreeable to [name].  Denies having any concerns with obtaining medication or with transportation. Lives with family.  Provided [name] brochure, 24-hr nurse advice line magnet, and [name].  Made inpatient RNCM aware.  Will request case to reflect "not active" with [name].    Marthenia Rolling, MSN-Ed, RN,BSN [facility] Liaison [phone]

## 2018-03-08 NOTE — Progress Notes (Signed)
Dr. Frederico Hamman informed of patient blood glucose levels for past 4 hours and that the insulin drip has been on pause for an hour. Dr. Frederico Hamman to d/c insulin drip and adjust fluids and orders for glucose management this morning.   -Bill RN

## 2018-03-09 LAB — GLUCOSE, CAPILLARY
GLUCOSE-CAPILLARY: 161 mg/dL — AB (ref 65–99)
GLUCOSE-CAPILLARY: 300 mg/dL — AB (ref 65–99)
Glucose-Capillary: 111 mg/dL — ABNORMAL HIGH (ref 65–99)
Glucose-Capillary: 305 mg/dL — ABNORMAL HIGH (ref 65–99)

## 2018-03-09 LAB — BASIC METABOLIC PANEL
ANION GAP: 7 (ref 5–15)
BUN: 19 mg/dL (ref 6–20)
CALCIUM: 8.2 mg/dL — AB (ref 8.9–10.3)
CO2: 22 mmol/L (ref 22–32)
Chloride: 108 mmol/L (ref 101–111)
Creatinine, Ser: 1.12 mg/dL — ABNORMAL HIGH (ref 0.44–1.00)
GFR calc non Af Amer: 57 mL/min — ABNORMAL LOW (ref >60)
GLUCOSE: 224 mg/dL — AB (ref 65–99)
POTASSIUM: 3.4 mmol/L — AB (ref 3.5–5.1)
Sodium: 137 mmol/L (ref 135–145)

## 2018-03-09 MED ORDER — INSULIN ASPART 100 UNIT/ML ~~LOC~~ SOLN
7.0000 [IU] | Freq: Three times a day (TID) | SUBCUTANEOUS | Status: DC
  Administered 2018-03-09 – 2018-03-11 (×7): 7 [IU] via SUBCUTANEOUS

## 2018-03-09 MED ORDER — POTASSIUM CHLORIDE CRYS ER 20 MEQ PO TBCR
60.0000 meq | EXTENDED_RELEASE_TABLET | Freq: Once | ORAL | Status: AC
  Administered 2018-03-09: 60 meq via ORAL
  Filled 2018-03-09: qty 3

## 2018-03-09 NOTE — Progress Notes (Signed)
   Subjective:  Patient complained of pleuritic chest pain overnight with coughing. Received Ibuprofen x1 with resolution of pain. She is overall doing better. Denies dyspnea and cough is improving. No complaints this AM.   Objective:  Vital signs in last 24 hours: Vitals:   03/09/18 0500 03/09/18 0800 03/09/18 0803 03/09/18 0805  BP:  (!) 149/89    Pulse:  75  70  Resp:  18  16  Temp:  98.5 F (36.9 C)    TempSrc:  Oral    SpO2:  98% 100% 100%  Weight: 248 lb 0.3 oz (112.5 kg)     Height:       Physical Exam  Constitutional: She is oriented to person, place, and time.  Morbidly obese female, well-developed, laying in bed in no acute distress   Cardiovascular: Normal rate, regular rhythm and normal heart sounds. Exam reveals no gallop and no friction rub.  No murmur heard. Pulmonary/Chest:  Air movement improved, no wheezes or crackles, no increased work of breathing, trach in place   Abdominal: Soft. Bowel sounds are normal. She exhibits no distension. There is no tenderness.  Musculoskeletal: She exhibits no edema.  Neurological: She is alert and oriented to person, place, and time.    Assessment/Plan:  Principal Problem:   Asthma exacerbation Active Problems:   Essential hypertension   Type 2 diabetes mellitus with diabetic polyneuropathy, without long-term current use of insulin (HCC)   Productive cough   AKI (acute kidney injury) ([place])  # Asthma exacerbation likely 2/2 upper respiratory infection:Imprving. Denies shortness of breath and not coughing as much. FiO2 21% via trach collar and saturating well. Will start weaning to room air as patient is not oxygen dependent at home. No wheezing or coarse breath sounds on exam.   - Continue prednisone 40 mg daily. Will need long taper.  - Wean to room air as able  - DuoNebs q6 hours and albuterol PRN for wheezing - Home hypertonic saline nebulizers BID, Dulera BID, singular 10 mg and Flonase daily - Supportive care for  cough   # AKI:No history of CKD. Initially AKI resolved, now Creatinine trending up.  - Continue mIVF NS @ 100 cc/hr  - BMP in AM   # Insulin dependent A2ZH complicated by polyneuropathy:CBG above goal in the 200s. Adjusting insulin regimen as below.  - Continue home Lantus 100U QHS - Increase Novolog 5U-->7U TID with meals - SSI-R + CBG monitoring  - Encourage compliance with HH/Carb mod diet - Continue home Gabapentin 600mg  BID  # Hypertension:  - Home amlodipine 10 mg and coreg 12.5 mg BID - Consider hydralazine prn if she were to remain persistently hypertensive    Dispo: Anticipated discharge in approximately 1-3 day(s) pending improvement in respiratory status.   Welford Roche, MD 03/09/2018, 10:20 AM Pager: [phone]

## 2018-03-09 NOTE — Care Management Important Message (Signed)
Important Message  Patient Details  Name: Alexandre Lightsey [name] MRN: [id number] Date of Birth: 1971-02-19   Medicare Important Message Given:  Yes    Zenon Mayo, RN 03/09/2018, 12:33 PM

## 2018-03-09 NOTE — Evaluation (Signed)
Physical Therapy Evaluation Patient Details Name: [name] MRN: [id number] DOB: 07-Nov-1970 Today's Date: 03/09/2018   History of Present Illness  47 yo with PMH of asthma requiring intubation and subsequent tracheostomy in 01/2017 2/2 tracheal stenosis, insulin dependent [id number] complicated by polyneuropathy, HTN, OSA, and chronic back pain who presents for evaluation of progressively worsening shortness of breath. Dx with Asthma exacerbation 2/2 upper respiratory infection  Clinical Impression  Patient evaluated by Physical Therapy with no further acute PT needs identified. All education has been completed and the patient has no further questions. Pt limited in ambulation by 3/4 DoE and coughing with increased RR however maintains good O2 saturation (see General comments). Pt currently mod I for bed mobility, and transfers and supervision for ambulation without AD. Pt encouraged to gradually build up her endurance and to do small amounts of mobility every hours to keep her strength.  See below for any follow-up Physical Therapy or equipment needs. PT is signing off. Thank you for this referral.     Follow Up Recommendations No PT follow up    Equipment Recommendations  None recommended by PT    Recommendations for Other Services       Precautions / Restrictions Precautions Precautions: None Restrictions Weight Bearing Restrictions: No      Mobility  Bed Mobility Overal bed mobility: Modified Independent                Transfers Overall transfer level: Modified independent Equipment used: None             General transfer comment: increased effort with power up, use of bed rails, steady once in standing  Ambulation/Gait Ambulation/Gait assistance: Supervision Gait Distance (Feet): 250 Feet Assistive device: None Gait Pattern/deviations: Step-through pattern;Shuffle Gait velocity: slowed Gait velocity interpretation: 1.31 - 2.62 ft/sec,  indicative of limited community ambulator General Gait Details: supervision for safety, pt requires 5x standing rest breaks secondary for coughing        Balance Overall balance assessment: No apparent balance deficits (not formally assessed)                                           Pertinent Vitals/Pain Pain Assessment: 0-10 Pain Score: 7  Pain Location: lungs and back  Pain Descriptors / Indicators: Aching;Sore Pain Intervention(s): Limited activity within patient's tolerance;Monitored during session;Repositioned    Home Living Family/patient expects to be discharged to:: Private residence Living Arrangements: Spouse/significant other;Children Available Help at Discharge: Family;Available PRN/intermittently Type of Home: Mobile home Home Access: Stairs to enter Entrance Stairs-Rails: Right Entrance Stairs-Number of Steps: 5 Home Layout: One level Home Equipment: Grab bars - tub/shower;Hand held shower head;Shower seat - built in      Prior Function Level of Independence: Independent                  Extremity/Trunk Assessment   Upper Extremity Assessment Upper Extremity Assessment: Overall WFL for tasks assessed    Lower Extremity Assessment Lower Extremity Assessment: Overall WFL for tasks assessed       Communication   Communication: Tracheostomy(mouths and whispers due to trach)  Cognition Arousal/Alertness: Awake/alert Behavior During Therapy: WFL for tasks assessed/performed Overall Cognitive Status: Within Functional Limits for tasks assessed  General Comments General comments (skin integrity, edema, etc.): SaO2 on RA 98%O2, HR 66 bpm, BP 104/56, with ambulation SaO2 maintained greater than 93%O2 with ambulation despite increased coughing, max HR 110 bpm         Assessment/Plan    PT Assessment Patent does not need any further PT services  PT Problem List         PT  Treatment Interventions      PT Goals (Current goals can be found in the Care Plan section)  Acute Rehab PT Goals Patient Stated Goal: go home PT Goal Formulation: With patient     AM-PAC PT "6 Clicks" Daily Activity  Outcome Measure Difficulty turning over in bed (including adjusting bedclothes, sheets and blankets)?: None Difficulty moving from lying on back to sitting on the side of the bed? : None Difficulty sitting down on and standing up from a chair with arms (e.g., wheelchair, bedside commode, etc,.)?: None Help needed moving to and from a bed to chair (including a wheelchair)?: None Help needed walking in hospital room?: None Help needed climbing 3-5 steps with a railing? : A Little 6 Click Score: 23    End of Session Equipment Utilized During Treatment: Gait belt Activity Tolerance: Patient limited by fatigue Patient left: in bed;with call bell/phone within reach Nurse Communication: Mobility status PT Visit Diagnosis: Difficulty in walking, not elsewhere classified (R26.2)    Time: 5009-3818 PT Time Calculation (min) (ACUTE ONLY): 22 min   Charges:   PT Evaluation $PT Eval Moderate Complexity: 1 Mod     PT G Codes:        Emmarae Cowdery B. Migdalia Dk PT, DPT Acute Rehabilitation  [phone] Pager [phone]    [place] 03/09/2018, 2:51 PM

## 2018-03-09 NOTE — Progress Notes (Signed)
Internal Medicine Attending:   I saw and examined the patient. I reviewed the resident's note and I agree with the resident's findings and plan as documented in the resident's note.  Patient states that she feels better today and that her cough is slowly improving and that her wheezing has gotten better.  Patient was initially admitted with an acute asthma exacerbation.  Patient has improved air movement today with no wheezes on auscultation.  We will continue with prednisone 40 mg daily with a long taper.  Continue with duo nebs and albuterol.  No further work-up at this time.  If patient continues to improve she may be stable for discharge in the morning.

## 2018-03-10 DIAGNOSIS — R042 Hemoptysis: Secondary | ICD-10-CM

## 2018-03-10 LAB — BASIC METABOLIC PANEL
ANION GAP: 7 (ref 5–15)
BUN: 15 mg/dL (ref 6–20)
CALCIUM: 8.3 mg/dL — AB (ref 8.9–10.3)
CO2: 23 mmol/L (ref 22–32)
Chloride: 110 mmol/L (ref 101–111)
Creatinine, Ser: 1.01 mg/dL — ABNORMAL HIGH (ref 0.44–1.00)
GFR calc Af Amer: >60 mL/min (ref >60)
GFR calc non Af Amer: >60 mL/min (ref >60)
Glucose, Bld: 178 mg/dL — ABNORMAL HIGH (ref 65–99)
Potassium: 3.7 mmol/L (ref 3.5–5.1)
Sodium: 140 mmol/L (ref 135–145)

## 2018-03-10 LAB — CULTURE, BLOOD (ROUTINE X 2)
Culture: NO GROWTH
Culture: NO GROWTH
Special Requests: ADEQUATE
Special Requests: ADEQUATE

## 2018-03-10 LAB — GLUCOSE, CAPILLARY
GLUCOSE-CAPILLARY: 238 mg/dL — AB (ref 65–99)
GLUCOSE-CAPILLARY: 200 mg/dL — AB (ref 65–99)
Glucose-Capillary: 98 mg/dL (ref 65–99)
Glucose-Capillary: 267 mg/dL — ABNORMAL HIGH (ref 65–99)

## 2018-03-10 NOTE — Progress Notes (Addendum)
Internal Medicine Attending:   I saw and examined the patient. I reviewed the resident's note and I agree with the resident's findings and plan as documented in the resident's note.  Patient feels well this morning and states that her shortness of breath is improving.  She did complain of a coughing fit this morning and had some streaks of blood with 2 small clots in her phlegm.  Patient was initially admitted for a severe asthma exacerbation.  She  continues to improve on duo nebs and steroids.  She is currently on prednisone 40 mg and will require a long taper.  We will continue to attempt to wean off oxygen.  No further work-up at this time.  Patient will likely be stable for discharge home tomorrow.

## 2018-03-10 NOTE — Progress Notes (Signed)
RT note- Patient is currently on 21% humidified air. Tolerating well. Not sure last time her trach has been changed or if she is seen in the trach clinic. This is something that would be good to look at before she is discharged. Continue to monitor.

## 2018-03-10 NOTE — Progress Notes (Signed)
   Subjective:  No acute events overnight.  This morning patient reports bloody secretions from trach tube including 2 small clots.  Otherwise feeling well.  Reports shortness of breath only when coughing, but coughing spells have decreased.  Tolerating p.o. intake well.  Objective:  Vital signs in last 24 hours: Vitals:   03/10/18 1125 03/10/18 1223 03/10/18 1419 03/10/18 1534  BP:  (!) 147/89    Pulse: 81 83  84  Resp: 12 18  17   Temp:  98.6 F (37 C)    TempSrc:  Oral    SpO2: 97% 98% 97% 96%  Weight:      Height:       Physical Exam  Constitutional: She is oriented to person, place, and time.  Obese female laying in bed in no acute distress  Cardiovascular: Normal rate, regular rhythm and normal heart sounds. Exam reveals no gallop and no friction rub.  No murmur heard. Pulmonary/Chest: Effort normal and breath sounds normal. No respiratory distress. She has no wheezes. She has no rales.  Abdominal: Soft. Bowel sounds are normal. She exhibits no distension. There is no tenderness.  Musculoskeletal: She exhibits no edema.  Neurological: She is alert and oriented to person, place, and time.    Assessment/Plan:  Principal Problem:   Asthma exacerbation Active Problems:   Essential hypertension   Type 2 diabetes mellitus with diabetic polyneuropathy, without long-term current use of insulin (HCC)   Productive cough   AKI (acute kidney injury) ([place])  # Asthma exacerbation likely 2/2 upper respiratory infection:Patient continues to improve.  Shortness of breath and cough are better.  FiO2 21% via trach collar andsaturating well. Will start weaning to room air as patient is not oxygen dependent at home. No wheezing or coarse breath sounds on exam.   -Continue prednisone 40 mg daily. Will need long taper. - Wean to room air as able  - DuoNebs q6 hours and albuterol PRN for wheezing - Home hypertonic saline nebulizers BID, Dulera BID, singular 10 mg and Flonase daily -  Supportive care for cough   # AKI:No history of CKD. Renal function improving, close to baseline.  - Continue mIVF NS @ 100 cc/hr   # Insulin dependent J3HL complicated by polyneuropathy:CBG above goal in the 200-300s. - Continue home Lantus 100U QHS - Increase Novolog 7U TID with meals - SSI-R + CBG monitoring - Encourage compliance with HH/Carb mod diet -Continue homeGabapentin 600mg  BID  # Hypertension: -Home amlodipine 10 mgandcoreg 12.5 mg BID - Consider hydralazineprn if she were to remain persistently hypertensive    Dispo: Anticipated discharge in approximately 1-2 day(s).   Welford Roche, MD 03/10/2018, 4:35 PM Pager: [phone]

## 2018-03-10 NOTE — Care Management Note (Addendum)
Case Management Note  Patient Details  Name: [name] MRN: [id number] Date of Birth: 03-21-1971  Subjective/Objective:  Asthma ex, aki, cre was elevated now improved, she c/o of some chest pain last pm , on old  trach collar 21 %, per pt eval no pt f/u needed.  Patient has improved today and if conts will dc in am, she states she is transported via car.                  Action/Plan: DC home when ready.   Expected Discharge Date:                  Expected Discharge Plan:  Home/Self Care  In-House Referral:     Discharge planning Services  CM Consult  Post Acute Care Choice:    Choice offered to:     DME Arranged:    DME Agency:     HH Arranged:    HH Agency:     Status of Service:  In process, will continue to follow  If discussed at Long Length of Stay Meetings, dates discussed:    Additional Comments:  Zenon Mayo, RN 03/10/2018, 2:23 PM

## 2018-03-11 DIAGNOSIS — Z9111 Patient's noncompliance with dietary regimen: Secondary | ICD-10-CM

## 2018-03-11 DIAGNOSIS — Z6841 Body Mass Index (BMI) 40.0 and over, adult: Secondary | ICD-10-CM

## 2018-03-11 LAB — BASIC METABOLIC PANEL
Anion gap: 9 (ref 5–15)
BUN: 15 mg/dL (ref 6–20)
CALCIUM: 8.5 mg/dL — AB (ref 8.9–10.3)
CHLORIDE: 109 mmol/L (ref 101–111)
CO2: 23 mmol/L (ref 22–32)
CREATININE: 1.17 mg/dL — AB (ref 0.44–1.00)
GFR calc Af Amer: >60 mL/min (ref >60)
GFR calc non Af Amer: 54 mL/min — ABNORMAL LOW (ref >60)
Glucose, Bld: 204 mg/dL — ABNORMAL HIGH (ref 65–99)
Potassium: 3.5 mmol/L (ref 3.5–5.1)
SODIUM: 141 mmol/L (ref 135–145)

## 2018-03-11 LAB — CBC WITH DIFFERENTIAL/PLATELET
BASOS ABS: 0 10*3/uL (ref 0.0–0.1)
BASOS PCT: 0 %
Eosinophils Absolute: 0 10*3/uL (ref 0.0–0.7)
Eosinophils Relative: 0 %
HCT: 38 % (ref 36.0–46.0)
Hemoglobin: 12.3 g/dL (ref 12.0–15.0)
LYMPHS ABS: 6.3 10*3/uL — AB (ref 0.7–4.0)
LYMPHS PCT: 39 %
MCH: 27.9 pg (ref 26.0–34.0)
MCHC: 32.4 g/dL (ref 30.0–36.0)
MCV: 86.2 fL (ref 78.0–100.0)
Monocytes Absolute: 1.5 10*3/uL — ABNORMAL HIGH (ref 0.1–1.0)
Monocytes Relative: 9 %
NEUTROS ABS: 8.4 10*3/uL — AB (ref 1.7–7.7)
Neutrophils Relative %: 52 %
PLATELETS: 356 10*3/uL (ref 150–400)
RBC: 4.41 MIL/uL (ref 3.87–5.11)
RDW: 14.1 % (ref 11.5–15.5)
WBC: 16.2 10*3/uL — ABNORMAL HIGH (ref 4.0–10.5)

## 2018-03-11 LAB — GLUCOSE, CAPILLARY
GLUCOSE-CAPILLARY: 100 mg/dL — AB (ref 65–99)
GLUCOSE-CAPILLARY: 252 mg/dL — AB (ref 65–99)
Glucose-Capillary: 380 mg/dL — ABNORMAL HIGH (ref 65–99)
Glucose-Capillary: 377 mg/dL — ABNORMAL HIGH (ref 65–99)

## 2018-03-11 MED ORDER — PREDNISONE 10 MG PO TABS: ORAL_TABLET | ORAL | 0 refills | Status: DC

## 2018-03-11 NOTE — Progress Notes (Signed)
Internal Medicine Attending:   I saw and examined the patient. I reviewed the resident's note and I agree with the resident's findings and plan as documented in the resident's note.  Patient complained of coughing up some blood clots through her trach.  She states her shortness of breath is improved and she has no further wheezing.  Patient was initially admitted for an acute asthma exacerbation.  We will continue with prednisone for now with a slow taper.  Patient is likely stable for discharge today if she has no further bleeding from the trach site.  If she continues to bleed from her trach site will consider consulting ENT.  No further work-up at this time.

## 2018-03-11 NOTE — Discharge Summary (Signed)
Name: [name] MRN: [id number] DOB: 1970-12-15 47 y.o. PCP: [name], MD  Date of Admission: 03/05/2018  1:22 PM Date of Discharge: 03/11/2018 Attending Physician: Aldine Contes, MD  Discharge Diagnosis: 1. Asthma exacerbation  2. T2DM   Discharge Medications: Allergies as of 03/11/2018      Reactions   Reglan [metoclopramide] Other (See Comments)   Panic attack      Medication List    STOP taking these medications   HYDROcodone-homatropine 5-1.5 MG/5ML syrup Commonly known as:  HYCODAN   pantoprazole 40 MG tablet Commonly known as:  PROTONIX     TAKE these medications   ACCU-CHEK AVIVA PLUS w/Device Kit Used to check blood sugars once daily. E11.9   ACCU-CHEK SOFTCLIX LANCETS lancets Use as instructed to check blood sugar once daily. E11.9   acetaminophen-codeine 300-30 MG tablet Commonly known as:  TYLENOL #3 Take 1 tablet by mouth at bedtime as needed for moderate pain.   albuterol 108 (90 Base) MCG/ACT inhaler Commonly known as:  PROAIR HFA Inhale 2 puffs into the lungs every 6 (six) hours as needed for wheezing or shortness of breath.   albuterol (2.5 MG/3ML) 0.083% nebulizer solution Commonly known as:  PROVENTIL Take 3 mLs (2.5 mg total) by nebulization every 6 (six) hours as needed for wheezing or shortness of breath.   amLODipine 10 MG tablet Commonly known as:  NORVASC Take 1 tablet (10 mg total) by mouth daily.   artificial tears ointment Place 1 application into both eyes at bedtime.   atorvastatin 20 MG tablet Commonly known as:  LIPITOR Take 1 tablet (20 mg total) by mouth daily.   BASAGLAR KWIKPEN 100 UNIT/ML Sopn Inject 1 mL (100 Units total) into the skin every morning. And pen needles 1/day   budesonide-formoterol 160-4.5 MCG/ACT inhaler Commonly known as:  SYMBICORT Inhale 2 puffs into the lungs 2 (two) times daily.   carvedilol 12.5 MG tablet Commonly known as:  COREG Take 1 tablet (12.5 mg total)  by mouth 2 (two) times daily with a meal.   chlorhexidine gluconate (MEDLINE KIT) 0.12 % solution Commonly known as:  PERIDEX 15 mLs by Mouth Rinse route 2 (two) times daily.   gabapentin 300 MG capsule Commonly known as:  NEURONTIN Take 2 capsules (600 mg total) by mouth 2 (two) times daily.   glucose blood test strip Commonly known as:  ACCU-CHEK AVIVA Use as directed to check blood sugars once daily. E11.9   Insulin Pen Needle 31G X 5 MM Misc 1 each by Does not apply route at bedtime.   Melatonin 3 MG Tabs Take 3 mg by mouth at bedtime as needed (for sleep).   montelukast 10 MG tablet Commonly known as:  SINGULAIR Take 1 tablet (10 mg total) by mouth at bedtime.   predniSONE 10 MG tablet Commonly known as:  DELTASONE Take 4 tablets for 3 days, then 3 for 3 days, then 2 for 3 days, then 1 for 3 days and then stop.   sertraline 100 MG tablet Commonly known as:  ZOLOFT Place 1 tablet (100 mg total) into feeding tube daily.   sodium chloride HYPERTONIC 3 % nebulizer solution Take 4 mLs by nebulization 2 (two) times daily.   SUMAtriptan 100 MG tablet Commonly known as:  IMITREX At the onset of a migraine; may repeat in 2 hours. Max daily dose 255m   tiZANidine 4 MG tablet Commonly known as:  ZANAFLEX Take 1 tablet (4 mg total) by mouth every 8 (eight)  hours as needed for muscle spasms.   topiramate 100 MG tablet Commonly known as:  TOPAMAX Take 1 tablet (100 mg total) by mouth 2 (two) times daily.       Disposition and follow-up:   [name] was discharged from [facility] in Stable condition.  At the hospital follow up visit please address:  1.  Please assess for ongoing respiratory symptoms and compliance with prednisone taper. Please continue to counsel patient on low carbohydrate intake.  May need adjustment in insulin regimen given persietent hyperglycemia while on prednisone taper.   2.  Labs / imaging needed at time  of follow-up: None   3.  Pending labs/ test needing follow-up: None   Follow-up Appointments: Follow-up Information    [name], MD. Schedule an appointment as soon as possible for a visit.   Specialty:  Family Medicine Why:  Please call your regular doctor to schedule a hospital follow up appointment.  Contact information: [place] [address] [place] Hospital Course by problem list:  1. Asthma exacerbation secondary to URI: [name] has a long history of severe asthma recently requiring prolonged intubation leading to tracheostomy after developing tracheal stenosis. She presented to the ED with fever, tachypnea, productive cough, and wheezing on exam. CXR did not reveal any focal infiltrates (reoprt beow). She was managed with schedule duonebs, steroids, and supportive care for cough. She improved clinically and was discharged home on a prednisone taper (40 x 3 days, 30 x 3 days, 20 x 3 days, and 10 x 3 days). She was strongly advised to follow up with her PCP and tracheostomy clinic.   2. T2DM: Patient has a history of uncontrolled DM. She required insulin drip during this admission while on IV Solumedrol for asthma exacerbation. She was transitioned to SQ Lantus and sliding scale when started on PO prednisone. She was noted to eat fast food meals several timed throughout the day brought by family during this admission and was extensively counseled on importance to adherence with low carbohydrate diet. She was advised to follow up with PCP and to continue to monitor BG at home given she was discharged on a prednisone taper.   Discharge Vitals:   BP 127/89   Pulse 89   Temp 98.8 F (37.1 C) (Oral)   Resp 18   Ht 5' 1"  (1.549 m)   Wt 254 lb 3.1 oz (115.3 kg)   SpO2 99%   BMI 48.03 kg/m   Pertinent Labs, Studies, and Procedures:   CBC Latest Ref Rng & Units 03/11/2018 03/07/2018 03/06/2018  WBC 4.0 - 10.5 K/uL 16.2(H) 15.7(H)  11.0(H)  Hemoglobin 12.0 - 15.0 g/dL 12.3 12.0 11.9(L)  Hematocrit 36.0 - 46.0 % 38.0 36.1 36.8  Platelets 150 - 400 K/uL 356 235 323   BMP Latest Ref Rng & Units 03/11/2018 03/10/2018 03/09/2018  Glucose 65 - 99 mg/dL 204(H) 178(H) 224(H)  BUN 6 - 20 mg/dL 15 15 19   Creatinine 0.44 - 1.00 mg/dL 1.17(H) 1.01(H) 1.12(H)  BUN/Creat Ratio 9 - 23 - - -  Sodium 135 - 145 mmol/L 141 140 137  Potassium 3.5 - 5.1 mmol/L 3.5 3.7 3.4(L)  Chloride 101 - 111 mmol/L 109 110 108  CO2 22 - 32 mmol/L 23 23 22   Calcium 8.9 - 10.3 mg/dL 8.5(L) 8.3(L) 8.2(L)   CBG (last 3)  Recent Labs    03/11/18 1720 03/11/18 2122  GLUCAP 380* 377*   CXR 6/14: FINDINGS: Study is hypoinspiratory with crowding of the perihilar and bibasilar bronchovascular markings. Given the low lung volumes, lungs appear clear. No confluent opacity to suggest a developing pneumonia. No pleural effusion or pneumothorax seen. Heart size and mediastinal contours are grossly stable. Tracheostomy tube is in the midline. No acute or suspicious osseous finding.  IMPRESSION: No active cardiopulmonary disease. No evidence of pneumonia or pulmonary edema.  CTA chest 6/14: FINDINGS: Cardiovascular: Satisfactory opacification of the pulmonary arteries to the segmental level. No evidence of pulmonary embolism. There is cardiomegaly. No pericardial effusion.  Mediastinum/Nodes: 1.9 cm right paratracheal node is unchanged since the prior exams. No new or enlarging lymph nodes identified. Thyroid gland appears normal. No pleural effusion. Mild dependent atelectasis noted.  Lungs/Pleura: No pleural effusion. Mild dependent atelectasis noted.  Upper Abdomen: Fatty infiltration of liver is seen.  Musculoskeletal: No acute or focal bony abnormality.  Review of the MIP images confirms the above findings.  IMPRESSION: Negative for pulmonary embolus. Cardiomegaly. Fatty infiltration of liver.   Discharge  Instructions: Discharge Instructions    Call MD for:  difficulty breathing, headache or visual disturbances   Complete by:  As directed    Call MD for:  extreme fatigue   Complete by:  As directed    Diet Carb Modified   Complete by:  As directed    Discharge instructions   Complete by:  As directed    [name] were admitted to the hospital due to an asthma exacerbation that was likely caused by an upper respiratory infection. You will need to continue taking a prednisone taper after discharge:   1- Take 4 tablets for 3 days then  2- Take 3 tablets for 3 days then  3- Take 2 tablets for 3 days then  4- Take 1 tablet for 3 days and then stop.   Make sure to follow up at your tracheostomy clinic, especially if you continue to cough blood clots after you are discharge. Please call us if you any questions.  - Dr. Frederico Hamman   Increase activity slowly   Complete by:  As directed       Signed: Welford Roche, MD 03/14/2018, 2:57 PM   Pager: [phone]

## 2018-03-11 NOTE — Progress Notes (Signed)
Inpatient Diabetes Program Recommendations  AACE/ADA: New Consensus Statement on Inpatient Glycemic Control (2015)  Target Ranges:  Prepandial:   less than 140 mg/dL      Peak postprandial:   less than 180 mg/dL (1-2 hours)      Critically ill patients:  140 - 180 mg/dL   Review of Glycemic Control  Results for [name], [name] (MRN [id number]) as of 03/11/2018 11:53  Ref. Range 03/10/2018 08:00 03/10/2018 12:20 03/10/2018 17:15 03/10/2018 20:26 03/11/2018 07:34  Glucose-Capillary Latest Ref Range: 65 - 99 mg/dL 98 238 (H) 200 (H) 267 (H) 100 (H)   Inpatient Diabetes Program Recommendations:    Post prandial glucose increased after meals. Noted breakfast was not covered with the meal coverage the past 2 mornings. Parameters are on orders. Called RN to notify.  Will watch trends.  Thanks,  Tama Headings RN, MSN, BC-ADM, [facility] Inpatient Diabetes Coordinator Team Pager [phone] (8a-5p)

## 2018-03-11 NOTE — Progress Notes (Signed)
   Subjective:  Patient continues to cough blood clots through trach. Describes them as larger than yesterday. Otherwise feeling well and continues to improve. Denies shortness of breath.   Objective:  Vital signs in last 24 hours: Vitals:   03/11/18 0053 03/11/18 0402 03/11/18 0438 03/11/18 0506  BP: (!) 141/75   (!) 148/85  Pulse:  82  80  Resp: 17 20  (!) 26  Temp: 98.5 F (36.9 C)   98.7 F (37.1 C)  TempSrc: Oral   Oral  SpO2: 96% 99%  97%  Weight:   254 lb 3.1 oz (115.3 kg)   Height:       Physical Exam  Constitutional: She is oriented to person, place, and time.  Morbidly obese female lying in bed in no acute distress  Neck:  Trach in place with streaks of red blood inside trach tube   Cardiovascular: Normal rate, regular rhythm and normal heart sounds. Exam reveals no gallop and no friction rub.  No murmur heard. Pulmonary/Chest: Effort normal and breath sounds normal. No respiratory distress. She has no wheezes. She has no rales.  Abdominal: Soft. Bowel sounds are normal. She exhibits no distension.  Musculoskeletal: She exhibits no edema.  Neurological: She is alert and oriented to person, place, and time.    Assessment/Plan:  Principal Problem:   Asthma exacerbation Active Problems:   Essential hypertension   Type 2 diabetes mellitus with diabetic polyneuropathy, without long-term current use of insulin (HCC)   Productive cough   AKI (acute kidney injury) ([place])  # Asthma exacerbation likely 2/2 upper respiratory infection:Patient continues to improve. Shortness of breath and cough are better.  FiO2 21% via trach collar andsaturating well. Follow up at trach clinic in [facility] and trach tube changed on 6/18. No wheezing or coarse breath sounds on exam.Still coughing blood clots and visible streak of red blood seen in trach tube. Will continue to monitor today and if ongoing bleeding will consult ENT. If not will plan to discharge home.    -Continueprednisone 40 mg daily. Will need long taper. - DuoNebs q6 hours and albuterol PRN for wheezing - Home hypertonic saline nebulizers BID, Dulera BID, singular 10 mg and Flonase daily - Supportive care for cough   # Insulin dependent N9GX complicated by polyneuropathy:CBG above goal in the 200s. -Continuehome Lantus 100U QHS -IncreaseNovolog 7UTID with meals -SSI-R + CBG monitoring - Encourage compliance with HH/Carb mod diet -Continue homeGabapentin 600mg  BID  # Hypertension: -Continue home amlodipine 10 mgandcoreg 12.5 mg BID - Consider hydralazineprn if she were to remain persistently hypertensive   Dispo: Anticipated discharge in approximately today-1 day(s).   Welford Roche, MD 03/11/2018, 7:02 AM Pager: [phone]

## 2018-03-12 LAB — PATHOLOGIST SMEAR REVIEW

## 2018-03-17 ENCOUNTER — Telehealth: Payer: Self-pay | Admitting: Family Medicine

## 2018-03-17 NOTE — Telephone Encounter (Signed)
Called patient and LVM to return call and schedule a HFU appt.

## 2018-03-30 ENCOUNTER — Inpatient Hospital Stay: Payer: Medicare Other

## 2018-04-07 ENCOUNTER — Ambulatory Visit
Admission: RE | Admit: 2018-04-07 | Discharge: 2018-04-07 | Disposition: A | Discharge status: Home / Self Care | Payer: Medicare Other | Source: Ambulatory Visit | Attending: Family Medicine | Admitting: Family Medicine

## 2018-04-07 DIAGNOSIS — Z1239 Encounter for other screening for malignant neoplasm of breast: Secondary | ICD-10-CM

## 2018-04-08 ENCOUNTER — Other Ambulatory Visit: Payer: Self-pay | Admitting: Family Medicine

## 2018-04-08 DIAGNOSIS — N63 Unspecified lump in unspecified breast: Secondary | ICD-10-CM

## 2018-04-09 ENCOUNTER — Other Ambulatory Visit: Payer: Self-pay | Admitting: Family Medicine

## 2018-04-09 DIAGNOSIS — D72820 Lymphocytosis (symptomatic): Secondary | ICD-10-CM

## 2018-04-09 NOTE — Progress Notes (Unsigned)
Please schedule her for future labs as we have to repeat her CBC given the one from her last hospitalization was abnormal.

## 2018-04-09 NOTE — Progress Notes (Signed)
Patient has walkin appointment on 04/12/18 repeat lab work will be done then.

## 2018-04-12 ENCOUNTER — Ambulatory Visit: Payer: Medicare Other | Attending: Family Medicine | Admitting: Physician Assistant

## 2018-04-12 VITALS — BP 155/99 | HR 95 | Temp 99.1°F | Resp 18 | Ht 61.0 in | Wt 247.0 lb

## 2018-04-12 DIAGNOSIS — M7989 Other specified soft tissue disorders: Secondary | ICD-10-CM | POA: Insufficient documentation

## 2018-04-12 DIAGNOSIS — Z930 Tracheostomy status: Secondary | ICD-10-CM

## 2018-04-12 DIAGNOSIS — Z794 Long term (current) use of insulin: Secondary | ICD-10-CM

## 2018-04-12 DIAGNOSIS — I10 Essential (primary) hypertension: Secondary | ICD-10-CM | POA: Diagnosis not present

## 2018-04-12 DIAGNOSIS — E1142 Type 2 diabetes mellitus with diabetic polyneuropathy: Secondary | ICD-10-CM | POA: Diagnosis not present

## 2018-04-12 DIAGNOSIS — Z9119 Patient's noncompliance with other medical treatment and regimen: Secondary | ICD-10-CM | POA: Diagnosis not present

## 2018-04-12 DIAGNOSIS — Z9114 Patient's other noncompliance with medication regimen: Secondary | ICD-10-CM | POA: Diagnosis not present

## 2018-04-12 DIAGNOSIS — Z79899 Other long term (current) drug therapy: Secondary | ICD-10-CM | POA: Insufficient documentation

## 2018-04-12 DIAGNOSIS — E1165 Type 2 diabetes mellitus with hyperglycemia: Secondary | ICD-10-CM | POA: Insufficient documentation

## 2018-04-12 DIAGNOSIS — F329 Major depressive disorder, single episode, unspecified: Secondary | ICD-10-CM | POA: Insufficient documentation

## 2018-04-12 DIAGNOSIS — G8929 Other chronic pain: Secondary | ICD-10-CM | POA: Diagnosis not present

## 2018-04-12 DIAGNOSIS — J4541 Moderate persistent asthma with (acute) exacerbation: Secondary | ICD-10-CM

## 2018-04-12 DIAGNOSIS — J4530 Mild persistent asthma, uncomplicated: Secondary | ICD-10-CM | POA: Diagnosis not present

## 2018-04-12 DIAGNOSIS — N179 Acute kidney failure, unspecified: Secondary | ICD-10-CM

## 2018-04-12 DIAGNOSIS — G4733 Obstructive sleep apnea (adult) (pediatric): Secondary | ICD-10-CM | POA: Diagnosis not present

## 2018-04-12 DIAGNOSIS — F330 Major depressive disorder, recurrent, mild: Secondary | ICD-10-CM

## 2018-04-12 DIAGNOSIS — K219 Gastro-esophageal reflux disease without esophagitis: Secondary | ICD-10-CM | POA: Diagnosis not present

## 2018-04-12 LAB — POCT URINALYSIS DIPSTICK
Bilirubin, UA: NEGATIVE
Blood, UA: NEGATIVE
Glucose, UA: POSITIVE — AB
KETONES UA: 15
Leukocytes, UA: NEGATIVE
Nitrite, UA: NEGATIVE
PROTEIN UA: POSITIVE — AB
SPEC GRAV UA: 1.01 (ref 1.010–1.025)
Urobilinogen, UA: 0.2 E.U./dL
pH, UA: 5.5 (ref 5.0–8.0)

## 2018-04-12 LAB — GLUCOSE, POCT (MANUAL RESULT ENTRY)
POC Glucose: 320 mg/dl — AB (ref 70–99)
POC Glucose: 330 mg/dl — AB (ref 70–99)

## 2018-04-12 MED ORDER — FUROSEMIDE 20 MG PO TABS: 20.0000 mg | ORAL_TABLET | Freq: Every day | ORAL | 3 refills | Status: DC

## 2018-04-12 MED ORDER — CLONIDINE HCL 0.2 MG PO TABS
0.1000 mg | ORAL_TABLET | Freq: Once | ORAL | Status: AC
  Administered 2018-04-12: 0.1 mg via ORAL

## 2018-04-12 MED ORDER — ALBUTEROL SULFATE (2.5 MG/3ML) 0.083% IN NEBU: 2.5000 mg | INHALATION_SOLUTION | Freq: Four times a day (QID) | RESPIRATORY_TRACT | 3 refills | Status: DC | PRN

## 2018-04-12 MED ORDER — INSULIN ASPART 100 UNIT/ML ~~LOC~~ SOLN
10.0000 [IU] | Freq: Once | SUBCUTANEOUS | Status: AC
  Administered 2018-04-12: 10 [IU] via SUBCUTANEOUS

## 2018-04-12 MED ORDER — ACETAMINOPHEN-CODEINE #3 300-30 MG PO TABS: 1.0000 | ORAL_TABLET | Freq: Every evening | ORAL | 1 refills | Status: DC | PRN

## 2018-04-12 MED ORDER — SERTRALINE HCL 100 MG PO TABS: 100.0000 mg | ORAL_TABLET | Freq: Every day | ORAL | 5 refills | Status: DC

## 2018-04-12 NOTE — Progress Notes (Signed)
Chief Complaint: Hospital follow up  Subjective: This is a 47 year old female with multiple medical problems.  She has diabetes mellitus requiring insulin, chronic back pain, depression, reflux, hypertension, chronic tracheostomy with tracheal stenosis, obstructive sleep apnea and a history of noncompliance.  She was admitted to the hospital on 03/05/2018 after presenting with shortness of breath, cough, green sputum production, headaches and a fever.  Her blood pressure was elevated on presentation as well as her heart rate.  She also had a temperature.  Creatinine was elevated at 1.5.  White blood cell count 10.3.  Glucose greater than 500.  Troponin negative.  Lactate 2.15.  EKG sinus tachycardia without acute changes.  CTPA no evidence of pulmonary emboli or pneumonia.  She was admitted by the internal medicine team for asthma exacerbation, hyperglycemia and acute kidney injury.  She was treated with empiric antibiotics, steroids, nebs, hydration and insulin drip.  At discharge she was continued on a steroid taper.  Today she states she is noted some swelling in her lower extremities.  She still is short of breath with minimal exertion.  She is been wheezing.  She is out of her neb treatments.  No chest pain.  No blackouts.  Intermittently compliant with medications and diet.   ROS:  GEN: denies fever or chills, denies change in weight Skin: denies lesions or rashes HEENT: denies headache, earache, epistaxis, sore throat, or neck pain LUNGS: +SHOB, dyspnea, PND, orthopnea CV: denies CP or palpitations ABD: denies abd pain, N or V EXT: denies muscle spasms or swelling; no pain in lower ext, no weakness NEURO: denies numbness or tingling, denies sz, stroke or TIA   Objective:  Vitals:   04/12/18 0952 04/12/18 1053  BP: (!) 168/108 (!) 155/99  Pulse: 100 95  Resp: 18   Temp: 99.1 F (37.3 C)   TempSrc: Oral   SpO2: 96%   Weight: 247 lb (112 kg)   Height: _0  (1.549 m)      Physical Exam:  General: in no acute distress. HEENT: no pallor, no icterus, moist oral mucosa, no JVD, no lymphadenopathy Heart: Normal  s1 &s2  Regular rate and rhythm, without murmurs, rubs, gallops. Lungs: exp wheezes. Abdomen: Soft, nontender, nondistended, positive bowel sounds. Extremities: 1-2 + edema Neuro: Alert, awake, oriented x3, nonfocal   Medications: Prior to Admission medications   Medication Sig Start Date End Date Taking? Authorizing Provider  ACCU-CHEK SOFTCLIX LANCETS lancets Use as instructed to check blood sugar once daily. E11.9 06/18/17  Yes Newlin, Charlane Ferretti, MD  acetaminophen-codeine (TYLENOL #3) 300-30 MG tablet Take 1 tablet by mouth at bedtime as needed for moderate pain. 04/12/18  Yes Ena Dawley, Tiffany S, PA-C  albuterol (PROAIR HFA) 108 (90 Base) MCG/ACT inhaler Inhale 2 puffs into the lungs every 6 (six) hours as needed for wheezing or shortness of breath. 01/18/18  Yes Newlin, Enobong, MD  albuterol (PROVENTIL) (2.5 MG/3ML) 0.083% nebulizer solution Take 3 mLs (2.5 mg total) by nebulization every 6 (six) hours as needed for wheezing or shortness of breath. 04/12/18  Yes Ena Dawley, Tiffany S, PA-C  amLODipine (NORVASC) 10 MG tablet Take 1 tablet (10 mg total) by mouth daily. 01/18/18  Yes [name], MD  Artificial Tear Ointment (ARTIFICIAL TEARS) ointment Place 1 application into both eyes at bedtime.    Yes [provider]  atorvastatin (LIPITOR) 20 MG tablet Take 1 tablet (20 mg total) by mouth daily. 01/18/18  Yes [name], MD  Blood Glucose Monitoring Suppl (ACCU-CHEK AVIVA PLUS) w/Device  KIT Used to check blood sugars once daily. E11.9 06/11/17  Yes [name], MD  budesonide-formoterol (SYMBICORT) 160-4.5 MCG/ACT inhaler Inhale 2 puffs into the lungs 2 (two) times daily. 01/18/18  Yes [name], MD  carvedilol (COREG) 12.5 MG tablet Take 1 tablet (12.5 mg total) by mouth 2 (two) times daily with a meal. 01/18/18  Yes Newlin, Enobong, MD   chlorhexidine gluconate, MEDLINE KIT, (PERIDEX) 0.12 % solution 15 mLs by Mouth Rinse route 2 (two) times daily. 03/05/17  Yes Arnell Asal, NP  gabapentin (NEURONTIN) 300 MG capsule Take 2 capsules (600 mg total) by mouth 2 (two) times daily. 01/18/18  Yes Newlin, Charlane Ferretti, MD  glucose blood (ACCU-CHEK AVIVA) test strip Use as directed to check blood sugars once daily. E11.9 06/11/17  Yes [name], MD  Insulin Glargine (BASAGLAR KWIKPEN) 100 UNIT/ML SOPN Inject 1 mL (100 Units total) into the skin every morning. And pen needles 1/day 11/27/17  Yes Renato Shin, MD  Insulin Pen Needle 31G X 5 MM MISC 1 each by Does not apply route at bedtime. 10/15/17  Yes [name], MD  Melatonin 3 MG TABS Take 3 mg by mouth at bedtime as needed (for sleep).  10/31/17  Yes [provider]  montelukast (SINGULAIR) 10 MG tablet Take 1 tablet (10 mg total) by mouth at bedtime. 01/18/18  Yes [name], MD  sertraline (ZOLOFT) 100 MG tablet Place 1 tablet (100 mg total) into feeding tube daily. 04/12/18  Yes Ena Dawley, Tiffany S, PA-C  sodium chloride HYPERTONIC 3 % nebulizer solution Take 4 mLs by nebulization 2 (two) times daily. 05/08/17  Yes Aline August, MD  SUMAtriptan (IMITREX) 100 MG tablet At the onset of a migraine; may repeat in 2 hours. Max daily dose 211m 01/18/18  Yes Newlin, Enobong, MD  tiZANidine (ZANAFLEX) 4 MG tablet Take 1 tablet (4 mg total) by mouth every 8 (eight) hours as needed for muscle spasms. 01/18/18  Yes NCharlott Rakes MD  topiramate (TOPAMAX) 100 MG tablet Take 1 tablet (100 mg total) by mouth 2 (two) times daily. 01/18/18  Yes NCharlott Rakes MD  furosemide (LASIX) 20 MG tablet Take 1 tablet (20 mg total) by mouth daily. 04/12/18   NBrayton Caves PA-C  fluticasone-salmeterol (ADVAIR HFA) [id number]/ACT inhaler Inhale 2 puffs into the lungs 2 (two) times daily. 10/15/17 10/15/17  NCharlott Rakes MD    Assessment: 1. Asthma Exacerbation 2. Chronic Tracheostomy 3.  Lower Extremity Edema 4. DM 2, insulin requiring with hyperglycemia 5. HTN 6. Noncompliance  Plan: POCT>nebs, insulin, Clonidine Updated labs>CBC, BMP Lasix Refilled Tylenol 3, nebs, Zoloft No change in chronic meds today Compliance encouraged with CPAP and O2 especially  Follow up:4 weeks as previously scheduled with Dr. NMargarita Rana The patient was given clear instructions to go to ER or return to medical center if symptoms don't improve, worsen or new problems develop. The patient verbalized understanding. The patient was told to call to get lab results if they haven't heard anything in the next week.   This note has been created with DSurveyor, quantity Any transcriptional errors are unintentional.   TZettie Pho PA-C 04/12/2018, 11:09 AM

## 2018-04-13 ENCOUNTER — Ambulatory Visit
Admission: RE | Admit: 2018-04-13 | Discharge: 2018-04-13 | Disposition: A | Discharge status: Home / Self Care | Payer: Medicare Other | Source: Ambulatory Visit | Attending: Family Medicine | Admitting: Family Medicine

## 2018-04-13 DIAGNOSIS — N63 Unspecified lump in unspecified breast: Secondary | ICD-10-CM

## 2018-04-13 DIAGNOSIS — R928 Other abnormal and inconclusive findings on diagnostic imaging of breast: Secondary | ICD-10-CM | POA: Diagnosis not present

## 2018-04-13 DIAGNOSIS — N6310 Unspecified lump in the right breast, unspecified quadrant: Secondary | ICD-10-CM | POA: Diagnosis not present

## 2018-04-13 LAB — BASIC METABOLIC PANEL
BUN/Creatinine Ratio: 12 (ref 9–23)
BUN: 13 mg/dL (ref 6–24)
CALCIUM: 9.3 mg/dL (ref 8.7–10.2)
CO2: 20 mmol/L (ref 20–29)
CREATININE: 1.09 mg/dL — AB (ref 0.57–1.00)
Chloride: 99 mmol/L (ref 96–106)
GFR calc non Af Amer: 60 mL/min/{1.73_m2} (ref >59)
GFR, EST AFRICAN AMERICAN: 69 mL/min/{1.73_m2} (ref >59)
Glucose: 331 mg/dL — ABNORMAL HIGH (ref 65–99)
Potassium: 4.1 mmol/L (ref 3.5–5.2)
Sodium: 137 mmol/L (ref 134–144)

## 2018-04-13 LAB — CBC WITH DIFFERENTIAL/PLATELET
BASOS ABS: 0 10*3/uL (ref 0.0–0.2)
Basos: 0 %
EOS (ABSOLUTE): 0 10*3/uL (ref 0.0–0.4)
EOS: 0 %
HEMATOCRIT: 34.8 % (ref 34.0–46.6)
HEMOGLOBIN: 11.9 g/dL (ref 11.1–15.9)
IMMATURE GRANS (ABS): 0 10*3/uL (ref 0.0–0.1)
Immature Granulocytes: 0 %
LYMPHS ABS: 4 10*3/uL — AB (ref 0.7–3.1)
LYMPHS: 40 %
MCH: 28.5 pg (ref 26.6–33.0)
MCHC: 34.2 g/dL (ref 31.5–35.7)
MCV: 84 fL (ref 79–97)
MONOCYTES: 7 %
Monocytes Absolute: 0.7 10*3/uL (ref 0.1–0.9)
NEUTROS ABS: 5.4 10*3/uL (ref 1.4–7.0)
Neutrophils: 53 %
Platelets: 373 10*3/uL (ref 150–450)
RBC: 4.17 x10E6/uL (ref 3.77–5.28)
RDW: 14.6 % (ref 12.3–15.4)
WBC: 10.1 10*3/uL (ref 3.4–10.8)

## 2018-04-27 ENCOUNTER — Encounter: Payer: Self-pay | Admitting: Family Medicine

## 2018-05-31 ENCOUNTER — Ambulatory Visit: Payer: Medicare Other | Admitting: Endocrinology

## 2018-06-15 ENCOUNTER — Ambulatory Visit: Payer: Medicare Other | Attending: Family Medicine | Admitting: Family Medicine

## 2018-06-15 ENCOUNTER — Encounter: Payer: Self-pay | Admitting: Family Medicine

## 2018-06-15 VITALS — BP 127/82 | HR 115 | Temp 98.6°F | Ht 61.0 in | Wt 238.0 lb

## 2018-06-15 DIAGNOSIS — Z794 Long term (current) use of insulin: Secondary | ICD-10-CM | POA: Diagnosis not present

## 2018-06-15 DIAGNOSIS — J4540 Moderate persistent asthma, uncomplicated: Secondary | ICD-10-CM | POA: Diagnosis not present

## 2018-06-15 DIAGNOSIS — Z9049 Acquired absence of other specified parts of digestive tract: Secondary | ICD-10-CM | POA: Diagnosis not present

## 2018-06-15 DIAGNOSIS — G4733 Obstructive sleep apnea (adult) (pediatric): Secondary | ICD-10-CM | POA: Insufficient documentation

## 2018-06-15 DIAGNOSIS — G43119 Migraine with aura, intractable, without status migrainosus: Secondary | ICD-10-CM | POA: Diagnosis not present

## 2018-06-15 DIAGNOSIS — Z888 Allergy status to other drugs, medicaments and biological substances status: Secondary | ICD-10-CM | POA: Insufficient documentation

## 2018-06-15 DIAGNOSIS — F329 Major depressive disorder, single episode, unspecified: Secondary | ICD-10-CM | POA: Diagnosis not present

## 2018-06-15 DIAGNOSIS — I10 Essential (primary) hypertension: Secondary | ICD-10-CM | POA: Insufficient documentation

## 2018-06-15 DIAGNOSIS — J386 Stenosis of larynx: Secondary | ICD-10-CM | POA: Diagnosis not present

## 2018-06-15 DIAGNOSIS — Z430 Encounter for attention to tracheostomy: Secondary | ICD-10-CM | POA: Diagnosis not present

## 2018-06-15 DIAGNOSIS — E1142 Type 2 diabetes mellitus with diabetic polyneuropathy: Secondary | ICD-10-CM | POA: Insufficient documentation

## 2018-06-15 DIAGNOSIS — Z87891 Personal history of nicotine dependence: Secondary | ICD-10-CM | POA: Diagnosis not present

## 2018-06-15 DIAGNOSIS — Z930 Tracheostomy status: Secondary | ICD-10-CM

## 2018-06-15 DIAGNOSIS — M5126 Other intervertebral disc displacement, lumbar region: Secondary | ICD-10-CM | POA: Diagnosis not present

## 2018-06-15 DIAGNOSIS — K219 Gastro-esophageal reflux disease without esophagitis: Secondary | ICD-10-CM | POA: Diagnosis not present

## 2018-06-15 DIAGNOSIS — M5136 Other intervertebral disc degeneration, lumbar region: Secondary | ICD-10-CM

## 2018-06-15 DIAGNOSIS — Z79899 Other long term (current) drug therapy: Secondary | ICD-10-CM | POA: Diagnosis not present

## 2018-06-15 DIAGNOSIS — Z9114 Patient's other noncompliance with medication regimen: Secondary | ICD-10-CM | POA: Diagnosis not present

## 2018-06-15 DIAGNOSIS — R490 Dysphonia: Secondary | ICD-10-CM | POA: Diagnosis not present

## 2018-06-15 DIAGNOSIS — M12811 Other specific arthropathies, not elsewhere classified, right shoulder: Secondary | ICD-10-CM | POA: Diagnosis not present

## 2018-06-15 DIAGNOSIS — G8929 Other chronic pain: Secondary | ICD-10-CM | POA: Diagnosis not present

## 2018-06-15 DIAGNOSIS — J45909 Unspecified asthma, uncomplicated: Secondary | ICD-10-CM | POA: Diagnosis not present

## 2018-06-15 DIAGNOSIS — Z7951 Long term (current) use of inhaled steroids: Secondary | ICD-10-CM | POA: Insufficient documentation

## 2018-06-15 DIAGNOSIS — E119 Type 2 diabetes mellitus without complications: Secondary | ICD-10-CM | POA: Diagnosis present

## 2018-06-15 LAB — POCT GLYCOSYLATED HEMOGLOBIN (HGB A1C): HEMOGLOBIN A1C: 12.5 % — AB (ref 4.0–5.6)

## 2018-06-15 LAB — GLUCOSE, POCT (MANUAL RESULT ENTRY)
POC GLUCOSE: 371 mg/dL — AB (ref 70–99)
POC GLUCOSE: 355 mg/dL — AB (ref 70–99)

## 2018-06-15 MED ORDER — INSULIN ASPART 100 UNIT/ML ~~LOC~~ SOLN
8.0000 [IU] | Freq: Once | SUBCUTANEOUS | Status: AC
  Administered 2018-06-15: 8 [IU] via SUBCUTANEOUS

## 2018-06-15 MED ORDER — BASAGLAR KWIKPEN 100 UNIT/ML ~~LOC~~ SOPN: 100.0000 [IU] | PEN_INJECTOR | SUBCUTANEOUS | 11 refills | Status: DC

## 2018-06-15 MED ORDER — SUMATRIPTAN SUCCINATE 100 MG PO TABS: ORAL_TABLET | ORAL | 1 refills | Status: DC

## 2018-06-15 NOTE — Progress Notes (Signed)
Subjective:  Patient ID: [name], female    DOB: [birth year]  Age: 47 y.o. MRN: [id number]  CC: Diabetes   [name]  is a 47 year old female with a history of type 2 diabetes mellitus (A1c 12.5), hypertension, obstructive sleep apnea, asthma, depression, s/p tracheostomy secondary to intubation related tracheal injury, subglottic stenosis (status post balloon dilatation and Kenalog injection of stenosis tracheostomy tube exchange by ENT at [facility]) here for follow-up visit. She complains of pain in her right shoulder with restricted range of motion and does have a history of right rotator cuff tear.  Her lower back also hurts from her disc herniation and radiates down her right lower extremity to the point where he prevents her from moving but she has been unable to seek orthopedic care due to her ongoing respiratory issues.  MRI lumbar spine without contrast 11/2016: IMPRESSION: L4-5: Shallow broad-based disc herniation with slight upward turning, unchanged since 2015. Bilateral facet arthropathy with fluid-filled joints, worsened since 2015. Abundant epidural fat. Mild constriction of the thecal sac without definite neural compression. This appearance could worsen with standing or flexion based on the morphology of the facet arthropathy  MRI left shoulder without contrast 10/2014: IMPRESSION: 1. Small full thickness insertional tear of the supraspinatus tendon anteriorly with minimal tendon retraction. No focal muscular atrophy. 2. Mild supraspinatus and subscapularis tendinosis. 3. No acute osseous findings or evidence of labral tear.  Her diabetes is uncontrolled and glucose is 371 in the clinic and she endorses forgetting to take her Basaglar last night.  She also endorses other instances where she forgot to take her insulin.  Her diabetes is managed by endocrine whom she has not seen in the last 6 months. She denies visual concerns and her  neuropathy is stable. She has not had any recent asthma flares after her last hospitalization 3 months ago.  Past Medical History:  Diagnosis Date  . Arthritis   . Asthma   . Chronic back pain   . Chronic chest pain   . Depression   . DM (diabetes mellitus) ([place])    INSULIN DEPENDENT  . GERD (gastroesophageal reflux disease)   . Headache(784.0)   . Hypertension   . Hypokalemia   . Respiratory disease 05/2017  . Tracheostomy in place [facility]) 02/2017    Past Surgical History:  Procedure Laterality Date  . APPENDECTOMY    . CESAREAN SECTION     x 3  . CHOLECYSTECTOMY N/A 03/02/2014   Procedure: LAPAROSCOPIC CHOLECYSTECTOMY;  Surgeon: Joyice Faster. Cornett, MD;  Location: [facility];  Service: General;  Laterality: N/A;  . HERNIA REPAIR    . PANENDOSCOPY N/A 03/04/2017   Procedure: PANENDOSCOPY WITH POSSIBLE FOREIGN BODY REMOVAL;  Surgeon: Jodi Marble, MD;  Location: [facility];  Service: ENT;  Laterality: N/A;  . ROTATOR CUFF REPAIR    . TRACHEOSTOMY  02/2017  . VESICOVAGINAL FISTULA CLOSURE W/ TAH  2009    Allergies  Allergen Reactions  . Reglan [Metoclopramide] Other (See Comments)    Panic attack     Outpatient Medications Prior to Visit  Medication Sig Dispense Refill  . ACCU-CHEK SOFTCLIX LANCETS lancets Use as instructed to check blood sugar once daily. E11.9 100 each 12  . acetaminophen-codeine (TYLENOL #3) 300-30 MG tablet Take 1 tablet by mouth at bedtime as needed for moderate pain. 30 tablet 1  . albuterol (PROAIR HFA) 108 (90 Base) MCG/ACT inhaler Inhale 2 puffs into the lungs every 6 (six) hours as needed for  wheezing or shortness of breath. 1 Inhaler 3  . albuterol (PROVENTIL) (2.5 MG/3ML) 0.083% nebulizer solution Take 3 mLs (2.5 mg total) by nebulization every 6 (six) hours as needed for wheezing or shortness of breath. 75 mL 3  . amLODipine (NORVASC) 10 MG tablet Take 1 tablet (10 mg total) by mouth daily. 90 tablet 1  . Artificial Tear Ointment (ARTIFICIAL TEARS)  ointment Place 1 application into both eyes at bedtime.     Marland Kitchen atorvastatin (LIPITOR) 20 MG tablet Take 1 tablet (20 mg total) by mouth daily. 90 tablet 1  . Blood Glucose Monitoring Suppl (ACCU-CHEK AVIVA PLUS) w/Device KIT Used to check blood sugars once daily. E11.9 1 kit 0  . budesonide-formoterol (SYMBICORT) 160-4.5 MCG/ACT inhaler Inhale 2 puffs into the lungs 2 (two) times daily. 3 Inhaler 1  . carvedilol (COREG) 12.5 MG tablet Take 1 tablet (12.5 mg total) by mouth 2 (two) times daily with a meal. 180 tablet 1  . chlorhexidine gluconate, MEDLINE KIT, (PERIDEX) 0.12 % solution 15 mLs by Mouth Rinse route 2 (two) times daily. 120 mL 0  . furosemide (LASIX) 20 MG tablet Take 1 tablet (20 mg total) by mouth daily. 30 tablet 3  . gabapentin (NEURONTIN) 300 MG capsule Take 2 capsules (600 mg total) by mouth 2 (two) times daily. 360 capsule 1  . glucose blood (ACCU-CHEK AVIVA) test strip Use as directed to check blood sugars once daily. E11.9 100 each 12  . Insulin Pen Needle 31G X 5 MM MISC 1 each by Does not apply route at bedtime. 30 each 5  . Melatonin 3 MG TABS Take 3 mg by mouth at bedtime as needed (for sleep).     . montelukast (SINGULAIR) 10 MG tablet Take 1 tablet (10 mg total) by mouth at bedtime. 90 tablet 1  . sertraline (ZOLOFT) 100 MG tablet Place 1 tablet (100 mg total) into feeding tube daily. 30 tablet 5  . sodium chloride HYPERTONIC 3 % nebulizer solution Take 4 mLs by nebulization 2 (two) times daily. 750 mL 0  . tiZANidine (ZANAFLEX) 4 MG tablet Take 1 tablet (4 mg total) by mouth every 8 (eight) hours as needed for muscle spasms. 90 tablet 3  . topiramate (TOPAMAX) 100 MG tablet Take 1 tablet (100 mg total) by mouth 2 (two) times daily. 180 tablet 1  . Insulin Glargine (BASAGLAR KWIKPEN) 100 UNIT/ML SOPN Inject 1 mL (100 Units total) into the skin every morning. And pen needles 1/day 15 pen 11  . SUMAtriptan (IMITREX) 100 MG tablet At the onset of a migraine; may repeat in 2  hours. Max daily dose 253m 20 tablet 1   No facility-administered medications prior to visit.     ROS Review of Systems  Constitutional: Negative for activity change, appetite change and fatigue.  HENT: Negative for congestion, sinus pressure and sore throat.   Eyes: Negative for visual disturbance.  Respiratory: Negative for cough, chest tightness, shortness of breath and wheezing.   Cardiovascular: Negative for chest pain and palpitations.  Gastrointestinal: Negative for abdominal distention, abdominal pain and constipation.  Endocrine: Negative for polydipsia.  Genitourinary: Negative for dysuria and frequency.  Musculoskeletal:       See hpi  Skin: Negative for rash.  Neurological: Negative for tremors, light-headedness and numbness.  Hematological: Does not bruise/bleed easily.  Psychiatric/Behavioral: Negative for agitation and behavioral problems.    Objective:  BP 127/82   Pulse (!) 115   Temp 98.6 F (37 C) (Oral)  Ht 5' 1"  (1.549 m)   Wt 238 lb (108 kg)   SpO2 97%   BMI 44.97 kg/m   BP/Weight 06/15/2018 04/12/2018 2/94/7654  Systolic BP 650 354 656  Diastolic BP 82 99 89  Wt. (Lbs) 238 247 254.19  BMI 44.97 46.67 48.03      Physical Exam  Constitutional: She is oriented to person, place, and time. She appears well-developed and well-nourished.  HENT:  Tracheostomy in place  Cardiovascular: Normal heart sounds and intact distal pulses. Tachycardia present.  No murmur heard. Pulmonary/Chest: Effort normal and breath sounds normal. She has no wheezes. She has no rales. She exhibits no tenderness.  Abdominal: Soft. Bowel sounds are normal. She exhibits no distension and no mass. There is no tenderness.  Musculoskeletal:  Tenderness to palpation of lumbar spine especially in the right lumbar region; positive straight leg raise on the right. Significantly decreased abduction of right upper extremity; left upper extremity is normal  Neurological: She is alert  and oriented to person, place, and time.  Skin: Skin is warm.    Lab Results  Component Value Date   HGBA1C 12.5 (A) 06/15/2018    Assessment & Plan:   1. Type 2 diabetes mellitus with diabetic polyneuropathy, without long-term current use of insulin (HCC) Uncontrolled with A1c of 12.5 Noncompliance has also played a role in poor control; will refer for home health care as she will benefit from nursing services Increased dose of Basaglar from 100 to 110 units Advised to schedule appointment with endocrine - POCT glucose (manual entry) - POCT glycosylated hemoglobin (Hb A1C) - Ambulatory referral to [facility] - insulin aspart (novoLOG) injection 8 Units - POCT glucose (manual entry) - CMP14+EGFR - Lipid panel - Microalbumin/Creatinine Ratio, Urine  2. Intractable migraine with aura without status migrainosus Stable - SUMAtriptan (IMITREX) 100 MG tablet; At the onset of a migraine; may repeat in 2 hours. Max daily dose 255m  Dispense: 20 tablet; Refill: 1  3. Moderate persistent asthma without complication No recent exacerbation Continue Singulair, Symbicort, Proventil  4. Tracheostomy status (HCC) Stable Followed by ENT  5. Bulging lumbar disc Uncontrolled Continue Zanaflex, gabapentin - Ambulatory referral to Physical Medicine Rehab  6. Rotator cuff arthropathy, right - Ambulatory referral to Orthopedic Surgery  7. Non compliance w medication regimen - Ambulatory referral to [place] encounter  Medications  . Insulin Glargine (BASAGLAR KWIKPEN) 100 UNIT/ML SOPN    Sig: Inject 1 mL (100 Units total) into the skin every morning. And pen needles 1/day    Dispense:  15 pen    Refill:  11  . SUMAtriptan (IMITREX) 100 MG tablet    Sig: At the onset of a migraine; may repeat in 2 hours. Max daily dose 2047m   Dispense:  20 tablet    Refill:  1  . insulin aspart (novoLOG) injection 8 Units    Follow-up: Return in about 3 months (around  09/14/2018) for Follow-up of chronic medical conditions.   EnCharlott RakesD

## 2018-06-15 NOTE — Patient Instructions (Signed)

## 2018-06-16 ENCOUNTER — Telehealth: Payer: Self-pay

## 2018-06-16 LAB — LIPID PANEL
CHOLESTEROL TOTAL: 165 mg/dL (ref 100–199)
Chol/HDL Ratio: 3.2 ratio (ref 0.0–4.4)
HDL: 51 mg/dL (ref >39)
LDL Calculated: 89 mg/dL (ref 0–99)
TRIGLYCERIDES: 124 mg/dL (ref 0–149)
VLDL Cholesterol Cal: 25 mg/dL (ref 5–40)

## 2018-06-16 LAB — CMP14+EGFR
ALK PHOS: 119 IU/L — AB (ref 39–117)
ALT: 20 IU/L (ref 0–32)
AST: 16 IU/L (ref 0–40)
Albumin/Globulin Ratio: 1.4 (ref 1.2–2.2)
Albumin: 4.5 g/dL (ref 3.5–5.5)
BILIRUBIN TOTAL: 0.4 mg/dL (ref 0.0–1.2)
BUN/Creatinine Ratio: 18 (ref 9–23)
BUN: 21 mg/dL (ref 6–24)
CHLORIDE: 98 mmol/L (ref 96–106)
CO2: 18 mmol/L — ABNORMAL LOW (ref 20–29)
Calcium: 9.8 mg/dL (ref 8.7–10.2)
Creatinine, Ser: 1.2 mg/dL — ABNORMAL HIGH (ref 0.57–1.00)
GFR calc non Af Amer: 54 mL/min/{1.73_m2} — ABNORMAL LOW (ref >59)
GFR, EST AFRICAN AMERICAN: 62 mL/min/{1.73_m2} (ref >59)
GLUCOSE: 348 mg/dL — AB (ref 65–99)
Globulin, Total: 3.3 g/dL (ref 1.5–4.5)
POTASSIUM: 4.3 mmol/L (ref 3.5–5.2)
Sodium: 135 mmol/L (ref 134–144)
TOTAL PROTEIN: 7.8 g/dL (ref 6.0–8.5)

## 2018-06-16 LAB — MICROALBUMIN / CREATININE URINE RATIO
Creatinine, Urine: 118.3 mg/dL
Microalb/Creat Ratio: 32.7 mg/g creat — ABNORMAL HIGH (ref 0.0–30.0)
Microalbumin, Urine: 38.7 ug/mL

## 2018-06-16 NOTE — Telephone Encounter (Signed)
Call received from the patient. She said that she would prefer [facility] for the home health referral.  If they are not able to accept the referral, she did not have a preference for an agency.  Call placed to [facility] # [phone], spoke to [facility] who requested that the referral be faxed to their Intake # [phone].  Referral then faxed as requested.

## 2018-06-16 NOTE — Telephone Encounter (Signed)
Attempted to contact the patient to inquire if she has a preference for home health agencies. She has used [facility] in the past. Call placed to # [phone] (M) and a message was left requesting a call back to this CM # [phone]

## 2018-06-17 ENCOUNTER — Encounter: Payer: Self-pay | Admitting: Obstetrics & Gynecology

## 2018-06-17 ENCOUNTER — Ambulatory Visit (INDEPENDENT_AMBULATORY_CARE_PROVIDER_SITE_OTHER): Payer: Medicare Other | Admitting: Obstetrics & Gynecology

## 2018-06-17 ENCOUNTER — Telehealth: Payer: Self-pay

## 2018-06-17 VITALS — BP 148/93 | HR 105 | Ht 61.0 in | Wt 238.3 lb

## 2018-06-17 DIAGNOSIS — N898 Other specified noninflammatory disorders of vagina: Secondary | ICD-10-CM

## 2018-06-17 NOTE — Patient Instructions (Signed)
Vaginitis °Vaginitis is a condition in which the vaginal tissue swells and becomes red (inflamed). This condition is most often caused by a change in the normal balance of bacteria and yeast that live in the vagina. This change causes an overgrowth of certain bacteria or yeast, which causes the inflammation. There are different types of vaginitis, but the most common types are: °· Bacterial vaginosis. °· Yeast infection (candidiasis). °· Trichomoniasis vaginitis. This is a sexually transmitted disease (STD). °· Viral vaginitis. °· Atrophic vaginitis. °· Allergic vaginitis. ° °What are the causes? °The cause of this condition depends on the type of vaginitis. It can be caused by: °· Bacteria (bacterial vaginosis). °· Yeast, which is a fungus (yeast infection). °· A parasite (trichomoniasis vaginitis). °· A virus (viral vaginitis). °· Low hormone levels (atrophic vaginitis). Low hormone levels can occur during pregnancy, breastfeeding, or after menopause. °· Irritants, such as bubble baths, scented tampons, and feminine sprays (allergic vaginitis). ° °Other factors can change the normal balance of the yeast and bacteria that live in the vagina. These include: °· Antibiotic medicines. °· Poor hygiene. °· Diaphragms, vaginal sponges, spermicides, birth control pills, and intrauterine devices (IUD). °· Sex. °· Infection. °· Uncontrolled diabetes. °· A weakened defense (immune) system. ° °What increases the risk? °This condition is more likely to develop in women who: °· Smoke. °· Use vaginal douches, scented tampons, or scented sanitary pads. °· Wear tight-fitting pants. °· Wear thong underwear. °· Use oral birth control pills or an IUD. °· Have sex without a condom. °· Have multiple sex partners. °· Have an STD. °· Frequently use the spermicide nonoxynol-9. °· Eat lots of foods high in sugar. °· Have uncontrolled diabetes. °· Have low estrogen levels. °· Have a weakened immune system from an immune disorder or medical  treatment. °· Are pregnant or breastfeeding. ° °What are the signs or symptoms? °Symptoms vary depending on the cause of the vaginitis. Common symptoms include: °· Abnormal vaginal discharge. °? The discharge is white, gray, or yellow with bacterial vaginosis. °? The discharge is thick, white, and cheesy with a yeast infection. °? The discharge is frothy and yellow or greenish with trichomoniasis. °· A bad vaginal smell. The smell is fishy with bacterial vaginosis. °· Vaginal itching, pain, or swelling. °· Sex that is painful. °· Pain or burning when urinating. ° °Sometimes there are no symptoms. °How is this diagnosed? °This condition is diagnosed based on your symptoms and medical history. A physical exam, including a pelvic exam, will also be done. You may also have other tests, including: °· Tests to determine the pH level (acidity or alkalinity) of your vagina. °· A whiff test, to assess the odor that results when a sample of your vaginal discharge is mixed with a potassium hydroxide solution. °· Tests of vaginal fluid. A sample will be examined under a microscope. ° °How is this treated? °Treatment varies depending on the type of vaginitis you have. Your treatment may include: °· Antibiotic creams or pills to treat bacterial vaginosis and trichomoniasis. °· Antifungal medicines, such as vaginal creams or suppositories, to treat a yeast infection. °· Medicine to ease discomfort if you have viral vaginitis. Your sexual partner should also be treated. °· Estrogen delivered in a cream, pill, suppository, or vaginal ring to treat atrophic vaginitis. If vaginal dryness occurs, lubricants and moisturizing creams may help. You may need to avoid scented soaps, sprays, or douches. °· Stopping use of a product that is causing allergic vaginitis. Then using a vaginal   cream to treat the symptoms. ° °Follow these instructions at home: °Lifestyle °· Keep your genital area clean and dry. Avoid soap, and only rinse the area  with water. °· Do not douche or use tampons until your health care provider says it is okay to do so. Use sanitary pads, if needed. °· Do not have sex until your health care provider approves. When you can return to sex, practice safe sex and use condoms. °· Wipe from front to back. This avoids the spread of bacteria from the rectum to the vagina. °General instructions °· Take over-the-counter and prescription medicines only as told by your health care provider. °· If you were prescribed an antibiotic medicine, take or use it as told by your health care provider. Do not stop taking or using the antibiotic even if you start to feel better. °· Keep all follow-up visits as told by your health care provider. This is important. °How is this prevented? °· Use mild, non-scented products. Do not use things that can irritate the vagina, such as fabric softeners. Avoid the following products if they are scented: °? Feminine sprays. °? Detergents. °? Tampons. °? Feminine hygiene products. °? Soaps or bubble baths. °· Let air reach your genital area. °? Wear cotton underwear to reduce moisture buildup. °? Avoid wearing underwear while you sleep. °? Avoid wearing tight pants and underwear or nylons without a cotton panel. °? Avoid wearing thong underwear. °· Take off any wet clothing, such as bathing suits, as soon as possible. °· Practice safe sex and use condoms. °Contact a health care provider if: °· You have abdominal pain. °· You have a fever. °· You have symptoms that last for more than 2-3 days. °Get help right away if: °· You have a fever and your symptoms suddenly get worse. °Summary °· Vaginitis is a condition in which the vaginal tissue becomes inflamed.This condition is most often caused by a change in the normal balance of bacteria and yeast that live in the vagina. °· Treatment varies depending on the type of vaginitis you have. °· Do not douche, use tampons , or have sex until your health care provider approves.  When you can return to sex, practice safe sex and use condoms. °This information is not intended to replace advice given to you by your health care provider. Make sure you discuss any questions you have with your health care provider. °Document Released: 07/06/2007 Document Revised: 10/14/2016 Document Reviewed: 10/14/2016 °Elsevier Interactive Patient Education © 2018 [employer] Inc. ° °

## 2018-06-17 NOTE — Progress Notes (Signed)
Pt states has had the vulvar lesion a little bit over a year.

## 2018-06-17 NOTE — Telephone Encounter (Signed)
Call placed to [facility]. Spoke to [place] who stated that they have the referral and will call patient to schedule a visit.

## 2018-06-17 NOTE — Progress Notes (Signed)
Patient ID: [name], female   DOB: [birth year], 47 y.o.   MRN: [id number]  Chief Complaint  Patient presents with  . Gynecologic Exam   Vulvar irritation  [name] is a 47 y.o. female.  [id number] No LMP recorded. Patient has had a hysterectomy.-2009 She was seen by her PCP 01/2018 and she was referred due to vulvar irritation and lesion, with itching present for 1 year, no odor, denies discharge or bleeding. Sx are persistent. HPI  Past Medical History:  Diagnosis Date  . Arthritis   . Asthma   . Chronic back pain   . Chronic chest pain   . Depression   . DM (diabetes mellitus) ([place])    INSULIN DEPENDENT  . GERD (gastroesophageal reflux disease)   . Headache(784.0)   . Hypertension   . Hypokalemia   . Respiratory disease 05/2017  . Tracheostomy in place [facility]) 02/2017    Past Surgical History:  Procedure Laterality Date  . APPENDECTOMY    . CESAREAN SECTION     x 3  . CHOLECYSTECTOMY N/A 03/02/2014   Procedure: LAPAROSCOPIC CHOLECYSTECTOMY;  Surgeon: Joyice Faster. Cornett, MD;  Location: [place];  Service: General;  Laterality: N/A;  . HERNIA REPAIR    . PANENDOSCOPY N/A 03/04/2017   Procedure: PANENDOSCOPY WITH POSSIBLE FOREIGN BODY REMOVAL;  Surgeon: Jodi Marble, MD;  Location: [place];  Service: ENT;  Laterality: N/A;  . ROTATOR CUFF REPAIR    . TRACHEOSTOMY  02/2017  . VESICOVAGINAL FISTULA CLOSURE W/ TAH  2009    Family History  Problem Relation Age of Onset  . Heart attack Mother   . Stroke Mother   . Diabetes Mother   . Hypertension Mother   . Arthritis Mother   . Stroke Father   . Hypertension Sister   . Diabetes Sister   . Seizures Brother   . Diabetes Brother     Social History Social History   Tobacco Use  . Smoking status: Former Smoker    Packs/day: 0.25    Years: 22.00    Pack years: 5.50    Types: Cigarettes    Last attempt to quit: 01/20/2017    Years since quitting: 1.4  . Smokeless tobacco: Never  Used  Substance Use Topics  . Alcohol use: No    Alcohol/week: 1.0 - 2.0 standard drinks    Types: 1 - 2 Glasses of wine per week    Comment: socially  . Drug use: No    Allergies  Allergen Reactions  . Reglan [Metoclopramide] Other (See Comments)    Panic attack    Current Outpatient Medications  Medication Sig Dispense Refill  . ACCU-CHEK SOFTCLIX LANCETS lancets Use as instructed to check blood sugar once daily. E11.9 100 each 12  . acetaminophen-codeine (TYLENOL #3) 300-30 MG tablet Take 1 tablet by mouth at bedtime as needed for moderate pain. 30 tablet 1  . albuterol (PROAIR HFA) 108 (90 Base) MCG/ACT inhaler Inhale 2 puffs into the lungs every 6 (six) hours as needed for wheezing or shortness of breath. 1 Inhaler 3  . albuterol (PROVENTIL) (2.5 MG/3ML) 0.083% nebulizer solution Take 3 mLs (2.5 mg total) by nebulization every 6 (six) hours as needed for wheezing or shortness of breath. 75 mL 3  . amLODipine (NORVASC) 10 MG tablet Take 1 tablet (10 mg total) by mouth daily. 90 tablet 1  . Artificial Tear Ointment (ARTIFICIAL TEARS) ointment Place 1 application into both eyes at bedtime.     Marland Kitchen  atorvastatin (LIPITOR) 20 MG tablet Take 1 tablet (20 mg total) by mouth daily. 90 tablet 1  . Blood Glucose Monitoring Suppl (ACCU-CHEK AVIVA PLUS) w/Device KIT Used to check blood sugars once daily. E11.9 1 kit 0  . budesonide-formoterol (SYMBICORT) 160-4.5 MCG/ACT inhaler Inhale 2 puffs into the lungs 2 (two) times daily. 3 Inhaler 1  . carvedilol (COREG) 12.5 MG tablet Take 1 tablet (12.5 mg total) by mouth 2 (two) times daily with a meal. 180 tablet 1  . chlorhexidine gluconate, MEDLINE KIT, (PERIDEX) 0.12 % solution 15 mLs by Mouth Rinse route 2 (two) times daily. 120 mL 0  . furosemide (LASIX) 20 MG tablet Take 1 tablet (20 mg total) by mouth daily. 30 tablet 3  . gabapentin (NEURONTIN) 300 MG capsule Take 2 capsules (600 mg total) by mouth 2 (two) times daily. 360 capsule 1  . glucose  blood (ACCU-CHEK AVIVA) test strip Use as directed to check blood sugars once daily. E11.9 100 each 12  . Insulin Glargine (BASAGLAR KWIKPEN) 100 UNIT/ML SOPN Inject 1 mL (100 Units total) into the skin every morning. And pen needles 1/day 15 pen 11  . Insulin Pen Needle 31G X 5 MM MISC 1 each by Does not apply route at bedtime. 30 each 5  . Melatonin 3 MG TABS Take 3 mg by mouth at bedtime as needed (for sleep).     . montelukast (SINGULAIR) 10 MG tablet Take 1 tablet (10 mg total) by mouth at bedtime. 90 tablet 1  . sertraline (ZOLOFT) 100 MG tablet Place 1 tablet (100 mg total) into feeding tube daily. 30 tablet 5  . sodium chloride HYPERTONIC 3 % nebulizer solution Take 4 mLs by nebulization 2 (two) times daily. 750 mL 0  . SUMAtriptan (IMITREX) 100 MG tablet At the onset of a migraine; may repeat in 2 hours. Max daily dose 258m 20 tablet 1  . tiZANidine (ZANAFLEX) 4 MG tablet Take 1 tablet (4 mg total) by mouth every 8 (eight) hours as needed for muscle spasms. 90 tablet 3  . topiramate (TOPAMAX) 100 MG tablet Take 1 tablet (100 mg total) by mouth 2 (two) times daily. 180 tablet 1   No current facility-administered medications for this visit.     Review of Systems Review of Systems  Respiratory:       Tracheostomy  Genitourinary: Positive for vaginal pain (vulvar irritation). Negative for pelvic pain, vaginal bleeding and vaginal discharge.    Blood pressure (!) 148/93, pulse (!) 105, height 5' 1"  (1.549 m), weight 238 lb 4.8 oz (108.1 kg).  Physical Exam Physical Exam  Constitutional: She appears well-developed. No distress.  Pulmonary/Chest:  Congestion, tracheostomy present  Genitourinary: Vaginal discharge (white) found.  Genitourinary Comments: Chronic inflammation and few excoriation entire vulva  Vitals reviewed.   Data Reviewed Path report and pap  Assessment    Vulvovaginitis possible yeast, probe obtained S/P TAH*    Plan    If vaginal infection is  identified will notify for treatment. Possible bx if etiology is not identified After hysterectomy for benign disease pap smears are not indicated       JEmeterio Reeve9/26/2019, 2:18 PM

## 2018-06-18 ENCOUNTER — Other Ambulatory Visit (HOSPITAL_COMMUNITY)
Admission: RE | Admit: 2018-06-18 | Discharge: 2018-06-18 | Disposition: A | Discharge status: Home / Self Care | Payer: Medicare Other | Source: Ambulatory Visit

## 2018-06-18 ENCOUNTER — Ambulatory Visit (INDEPENDENT_AMBULATORY_CARE_PROVIDER_SITE_OTHER): Payer: Medicare Other | Admitting: *Deleted

## 2018-06-18 VITALS — BP 142/77 | HR 106 | Wt 240.0 lb

## 2018-06-18 DIAGNOSIS — N760 Acute vaginitis: Secondary | ICD-10-CM | POA: Diagnosis not present

## 2018-06-18 DIAGNOSIS — N898 Other specified noninflammatory disorders of vagina: Secondary | ICD-10-CM

## 2018-06-18 DIAGNOSIS — B9689 Other specified bacterial agents as the cause of diseases classified elsewhere: Secondary | ICD-10-CM | POA: Insufficient documentation

## 2018-06-18 DIAGNOSIS — B373 Candidiasis of vulva and vagina: Secondary | ICD-10-CM | POA: Diagnosis not present

## 2018-06-18 NOTE — Progress Notes (Signed)
Chart reviewed for nurse visit. Agree with plan of care.   Wende Mott, [place] 06/18/2018 3:35 PM

## 2018-06-18 NOTE — Progress Notes (Signed)
Pt saw Dr. Roselie Awkward in office yesterday. Wet prep sent to lab then but had no label. Pt performed self swab and sent to lab today.  Pt will be notified of results via [place]. She voiced understanding.

## 2018-06-21 LAB — CERVICOVAGINAL ANCILLARY ONLY
BACTERIAL VAGINITIS: POSITIVE — AB
Candida vaginitis: POSITIVE — AB
Chlamydia: NEGATIVE
NEISSERIA GONORRHEA: NEGATIVE
TRICH (WINDOWPATH): NEGATIVE

## 2018-06-22 ENCOUNTER — Other Ambulatory Visit: Payer: Self-pay

## 2018-06-22 ENCOUNTER — Telehealth: Payer: Self-pay | Admitting: Family Medicine

## 2018-06-22 ENCOUNTER — Telehealth: Payer: Self-pay

## 2018-06-22 DIAGNOSIS — I10 Essential (primary) hypertension: Secondary | ICD-10-CM | POA: Diagnosis not present

## 2018-06-22 DIAGNOSIS — E1142 Type 2 diabetes mellitus with diabetic polyneuropathy: Secondary | ICD-10-CM | POA: Diagnosis not present

## 2018-06-22 DIAGNOSIS — N898 Other specified noninflammatory disorders of vagina: Secondary | ICD-10-CM

## 2018-06-22 DIAGNOSIS — M1991 Primary osteoarthritis, unspecified site: Secondary | ICD-10-CM | POA: Diagnosis not present

## 2018-06-22 DIAGNOSIS — E119 Type 2 diabetes mellitus without complications: Secondary | ICD-10-CM | POA: Diagnosis not present

## 2018-06-22 DIAGNOSIS — J4540 Moderate persistent asthma, uncomplicated: Secondary | ICD-10-CM | POA: Diagnosis not present

## 2018-06-22 DIAGNOSIS — Z930 Tracheostomy status: Secondary | ICD-10-CM | POA: Diagnosis not present

## 2018-06-22 DIAGNOSIS — Z9981 Dependence on supplemental oxygen: Secondary | ICD-10-CM | POA: Diagnosis not present

## 2018-06-22 DIAGNOSIS — H0015 Chalazion left lower eyelid: Secondary | ICD-10-CM | POA: Diagnosis not present

## 2018-06-22 DIAGNOSIS — G43119 Migraine with aura, intractable, without status migrainosus: Secondary | ICD-10-CM | POA: Diagnosis not present

## 2018-06-22 DIAGNOSIS — Z9181 History of falling: Secondary | ICD-10-CM | POA: Diagnosis not present

## 2018-06-22 DIAGNOSIS — F329 Major depressive disorder, single episode, unspecified: Secondary | ICD-10-CM | POA: Diagnosis not present

## 2018-06-22 DIAGNOSIS — Z794 Long term (current) use of insulin: Secondary | ICD-10-CM | POA: Diagnosis not present

## 2018-06-22 DIAGNOSIS — H5203 Hypermetropia, bilateral: Secondary | ICD-10-CM | POA: Diagnosis not present

## 2018-06-22 DIAGNOSIS — Z7984 Long term (current) use of oral hypoglycemic drugs: Secondary | ICD-10-CM | POA: Diagnosis not present

## 2018-06-22 DIAGNOSIS — H524 Presbyopia: Secondary | ICD-10-CM | POA: Diagnosis not present

## 2018-06-22 DIAGNOSIS — H52223 Regular astigmatism, bilateral: Secondary | ICD-10-CM | POA: Diagnosis not present

## 2018-06-22 DIAGNOSIS — G4733 Obstructive sleep apnea (adult) (pediatric): Secondary | ICD-10-CM | POA: Diagnosis not present

## 2018-06-22 MED ORDER — METRONIDAZOLE 500 MG PO TABS: 500.0000 mg | ORAL_TABLET | Freq: Two times a day (BID) | ORAL | 0 refills | Status: DC

## 2018-06-22 MED ORDER — TERCONAZOLE 0.4 % VA CREA: 1.0000 | TOPICAL_CREAM | Freq: Every day | VAGINAL | 0 refills | Status: DC

## 2018-06-22 NOTE — Telephone Encounter (Signed)
Sherry with [facility] called to notify that they have not been able to get a hold of the patient and will try to begin her treatment on OCT  3rd, they will try to reach her prior to this appointment however, if not it will delay the care.

## 2018-06-22 NOTE — Telephone Encounter (Signed)
April from [facility] called to get verbal orders for the patient. She requested for nursing 2x's wk for 2wks and then 1x a wk for 4 wks. PT & OT eval and treat due to the patients pain and sob with trache. Verbal order for a social worker to come out and screen patient for patient care services. April stated the patients tylenol 3 is not helping her pain as well as maybe needing a depression medication for her depression and feeling hopeless.

## 2018-06-22 NOTE — Telephone Encounter (Signed)
Patient states that Tylenol #3 is not working for her pain. Patient would also like a medication for depression.

## 2018-06-23 MED ORDER — BUPROPION HCL ER (SR) 150 MG PO TB12: 150.0000 mg | ORAL_TABLET | Freq: Two times a day (BID) | ORAL | 3 refills | Status: DC

## 2018-06-23 NOTE — Telephone Encounter (Signed)
She is already on Zoloft for depression.  I have added Wellbutrin to her regimen.  If Tylenol 3 does not help the pain at the tracheostomy site she might have to speak with the ENT specialist about something stronger for pain.

## 2018-06-24 NOTE — Telephone Encounter (Signed)
Noted  

## 2018-06-25 ENCOUNTER — Ambulatory Visit (INDEPENDENT_AMBULATORY_CARE_PROVIDER_SITE_OTHER): Payer: Medicare Other

## 2018-06-25 ENCOUNTER — Encounter: Payer: Self-pay | Admitting: Physical Medicine & Rehabilitation

## 2018-06-25 ENCOUNTER — Ambulatory Visit (INDEPENDENT_AMBULATORY_CARE_PROVIDER_SITE_OTHER): Payer: Medicare Other | Admitting: Orthopedic Surgery

## 2018-06-25 ENCOUNTER — Encounter (INDEPENDENT_AMBULATORY_CARE_PROVIDER_SITE_OTHER): Payer: Self-pay | Admitting: Orthopedic Surgery

## 2018-06-25 DIAGNOSIS — M25511 Pain in right shoulder: Secondary | ICD-10-CM | POA: Diagnosis not present

## 2018-06-25 DIAGNOSIS — G8929 Other chronic pain: Secondary | ICD-10-CM

## 2018-06-25 NOTE — Progress Notes (Signed)
Office Visit Note   Patient: [name]           Date of Birth: 1971/04/18           MRN: [id number] Visit Date: 06/25/2018 Requested by: [name], MD [place], [place] [id number] PCP: [name], MD  Subjective: Chief Complaint  Patient presents with  . Right Shoulder - Pain  . Lower Back - Pain    HPI: [name] is a patient with right shoulder pain as well as low back pain.  She is had an MRI of her lumbar spine from March 2018 which shows bilateral facet arthritis as well as a shallow L4-5 herniated disc.  She has type 2 diabetes and has been in and out of the hospital for the last year due primarily to asthma issues.  She does have a tracheostomy due to these asthma issues.  She states she also has known injury to that right shoulder but significant pain in the deltoid region.  Reports diminished range of motion.  Reports constant pain.  She has had previous left shoulder surgery which sounds like it may have been rotator cuff repair.  She states the right shoulder feels like the left shoulder.              ROS: All systems reviewed are negative as they relate to the chief complaint within the history of present illness.  Patient denies  fevers or chills.   Assessment & Plan: Visit Diagnoses:  1. Chronic right shoulder pain     Plan: Impression is chronic right shoulder pain of long duration with normal radiographs but symptoms similar to the left shoulder which had rotator cuff pathology.  She has type 2 diabetes and her blood glucose has been high recently so I am not inclined to do a cortisone injection today.  I think MRI arthrogram of that right shoulder to evaluate rotator cuff tear is appropriate at this time.  Also regarding her back I think she would be a reasonable candidate for epidural steroid injections due to her facet arthritis which is been present.  I will send her to Dr. Ernestina Patches for consult regarding that.  Follow-Up  Instructions: Return for after MRI.   Orders:  Orders Placed This Encounter  Procedures  . XR Shoulder Right  . MR SHOULDER RIGHT W CONTRAST  . Arthrogram  . Ambulatory referral to Physical Medicine Rehab   No orders of the defined types were placed in this encounter.     Procedures: No procedures performed   Clinical Data: No additional findings.  Objective: Vital Signs: There were no vitals taken for this visit.  Physical Exam:   Constitutional: Patient appears well-developed HEENT:  Head: Normocephalic Eyes:EOM are normal Neck: Normal range of motion Cardiovascular: Normal rate Pulmonary/chest: Effort normal Neurologic: Patient is alert Skin: Skin is warm Psychiatric: Patient has normal mood and affect    Ortho Exam: Ortho exam demonstrates positive nerve root tension signs on the right negative on the left.  She has good ankle dorsiflexion plantarflexion quad hamstring strength with symmetric reflexes bilateral patella and Achilles.  Pedal pulses palpable.  No groin pain with internal/external rotation of the leg.  No other masses lymph adenopathy or skin changes noted in that back region.  Some pain with forward and lateral bending is present.  The right shoulder is examined.  She has slight limitation of motion above 90 degrees of forward flexion compared to the left-hand side.  Cuff strength  is actually pretty reasonable to infraspinatus supraspinatus and subscap muscle testing.  Mild AC joint tenderness to direct palpation.  I do not detect a lot of coarse grinding or crepitus with active or passive range of motion of that right shoulder.  Specialty Comments:  No specialty comments available.  Imaging: Xr Shoulder Right  Result Date: 06/25/2018 AP outlet and axillary lateral right shoulder reviewed.  Shoulder is located.  Acromiohumeral distance maintained.  Mild enthesopathic changes noted at the rotator cuff insertion.  Mild AC joint degenerative changes.  No  fracture or dislocation is present.    PMFS History: Patient Active Problem List   Diagnosis Date Noted  . Rotator cuff arthropathy, right 06/15/2018  . AKI (acute kidney injury) ([place]) 03/06/2018  . Carpal tunnel syndrome 01/18/2018  . Acute respiratory distress 06/02/2017  . Elevated lactic acid level 06/02/2017  . Sepsis ([place]) 05/07/2017  . Productive cough 05/06/2017  . Normocytic anemia 05/06/2017  . Hypokalemia 05/06/2017  . Tracheostomy status ([place]) 04/28/2017  . Tracheal stenosis 03/04/2017  . Acute blood loss anemia   . Generalized anxiety disorder   . Hypoalbuminemia due to protein-calorie malnutrition ([place])   . Severe asthma with acute exacerbation   . Type 2 diabetes mellitus with diabetic polyneuropathy, without long-term current use of insulin ([place])   . Morbid obesity ([place])   . Chronic pain syndrome   . Acute renal failure ([place])   . Gastritis and gastroduodenitis 02/22/2016  . Depression 02/22/2016  . Migraine 01/30/2015  . Bulging lumbar disc 09/05/2013  . Essential hypertension 09/05/2013  . Acute bronchitis 05/01/2013  . Asthma exacerbation 04/27/2013  . Allergic rhinitis, seasonal 08/11/2012  . Chronic cough 08/11/2012  . Sleep apnea, obstructive 12/04/2011   Past Medical History:  Diagnosis Date  . Arthritis   . Asthma   . Chronic back pain   . Chronic chest pain   . Depression   . DM (diabetes mellitus) ([place])    INSULIN DEPENDENT  . GERD (gastroesophageal reflux disease)   . Headache(784.0)   . Hypertension   . Hypokalemia   . Respiratory disease 05/2017  . Tracheostomy in place [facility]) 02/2017    Family History  Problem Relation Age of Onset  . Heart attack Mother   . Stroke Mother   . Diabetes Mother   . Hypertension Mother   . Arthritis Mother   . Stroke Father   . Hypertension Sister   . Diabetes Sister   . Seizures Brother   . Diabetes Brother     Past Surgical History:  Procedure Laterality Date  . APPENDECTOMY    . CESAREAN  SECTION     x 3  . CHOLECYSTECTOMY N/A 03/02/2014   Procedure: LAPAROSCOPIC CHOLECYSTECTOMY;  Surgeon: Joyice Faster. Cornett, MD;  Location: [facility] [place];  Service: General;  Laterality: N/A;  . HERNIA REPAIR    . PANENDOSCOPY N/A 03/04/2017   Procedure: PANENDOSCOPY WITH POSSIBLE FOREIGN BODY REMOVAL;  Surgeon: Jodi Marble, MD;  Location: [facility];  Service: ENT;  Laterality: N/A;  . ROTATOR CUFF REPAIR    . TRACHEOSTOMY  02/2017  . VESICOVAGINAL FISTULA CLOSURE W/ TAH  2009   Social History   Occupational History  . Not on file  Tobacco Use  . Smoking status: Former Smoker    Packs/day: 0.25    Years: 22.00    Pack years: 5.50    Types: Cigarettes    Last attempt to quit: 01/20/2017    Years since quitting: 1.4  . Smokeless  tobacco: Never Used  Substance and Sexual Activity  . Alcohol use: No    Alcohol/week: 1.0 - 2.0 standard drinks    Types: 1 - 2 Glasses of wine per week    Comment: socially  . Drug use: No  . Sexual activity: Yes    Partners: Male    Birth control/protection: None

## 2018-07-02 ENCOUNTER — Telehealth: Payer: Self-pay

## 2018-07-02 DIAGNOSIS — I10 Essential (primary) hypertension: Secondary | ICD-10-CM | POA: Diagnosis not present

## 2018-07-02 DIAGNOSIS — J4540 Moderate persistent asthma, uncomplicated: Secondary | ICD-10-CM | POA: Diagnosis not present

## 2018-07-02 DIAGNOSIS — F329 Major depressive disorder, single episode, unspecified: Secondary | ICD-10-CM | POA: Diagnosis not present

## 2018-07-02 DIAGNOSIS — G43119 Migraine with aura, intractable, without status migrainosus: Secondary | ICD-10-CM | POA: Diagnosis not present

## 2018-07-02 DIAGNOSIS — E1142 Type 2 diabetes mellitus with diabetic polyneuropathy: Secondary | ICD-10-CM | POA: Diagnosis not present

## 2018-07-02 DIAGNOSIS — M1991 Primary osteoarthritis, unspecified site: Secondary | ICD-10-CM | POA: Diagnosis not present

## 2018-07-06 NOTE — Telephone Encounter (Signed)
Message sent via MyChart.

## 2018-07-07 ENCOUNTER — Telehealth: Payer: Self-pay | Admitting: Family Medicine

## 2018-07-07 DIAGNOSIS — G43119 Migraine with aura, intractable, without status migrainosus: Secondary | ICD-10-CM | POA: Diagnosis not present

## 2018-07-07 DIAGNOSIS — I10 Essential (primary) hypertension: Secondary | ICD-10-CM | POA: Diagnosis not present

## 2018-07-07 DIAGNOSIS — J4540 Moderate persistent asthma, uncomplicated: Secondary | ICD-10-CM | POA: Diagnosis not present

## 2018-07-07 DIAGNOSIS — F329 Major depressive disorder, single episode, unspecified: Secondary | ICD-10-CM | POA: Diagnosis not present

## 2018-07-07 DIAGNOSIS — M1991 Primary osteoarthritis, unspecified site: Secondary | ICD-10-CM | POA: Diagnosis not present

## 2018-07-07 DIAGNOSIS — E1142 Type 2 diabetes mellitus with diabetic polyneuropathy: Secondary | ICD-10-CM | POA: Diagnosis not present

## 2018-07-07 NOTE — Telephone Encounter (Signed)
[name] with [facility] wanted to report that the patient had a bad migraine with visuals and reported migraine pain went down a bit however, patient's blood pressure was 165/100. Please follow up.

## 2018-07-08 DIAGNOSIS — F329 Major depressive disorder, single episode, unspecified: Secondary | ICD-10-CM | POA: Diagnosis not present

## 2018-07-08 DIAGNOSIS — E1142 Type 2 diabetes mellitus with diabetic polyneuropathy: Secondary | ICD-10-CM | POA: Diagnosis not present

## 2018-07-08 DIAGNOSIS — G43119 Migraine with aura, intractable, without status migrainosus: Secondary | ICD-10-CM | POA: Diagnosis not present

## 2018-07-08 DIAGNOSIS — I10 Essential (primary) hypertension: Secondary | ICD-10-CM | POA: Diagnosis not present

## 2018-07-08 DIAGNOSIS — M1991 Primary osteoarthritis, unspecified site: Secondary | ICD-10-CM | POA: Diagnosis not present

## 2018-07-08 DIAGNOSIS — J4540 Moderate persistent asthma, uncomplicated: Secondary | ICD-10-CM | POA: Diagnosis not present

## 2018-07-08 NOTE — Telephone Encounter (Signed)
Will route to PCP for review. 

## 2018-07-09 MED ORDER — CARVEDILOL 12.5 MG PO TABS: 25.0000 mg | ORAL_TABLET | Freq: Two times a day (BID) | ORAL | 0 refills | Status: DC

## 2018-07-09 NOTE — Telephone Encounter (Signed)
Dose of carvedilol increased to 25 mg daily.

## 2018-07-09 NOTE — Telephone Encounter (Signed)
Call placed to the patient to confirm if she has been taking her norvasc and carvedilol consistently.  She said that she has been taking Norvasc 10 mg daily.  She also said that she has been taking the carvedilol 12.5 mg twice daily until she ran out  " yesterday or 2 days ago."  She said that she needs a refill of carvedilol.  She explained that she had taken it prior to the home health nurse taking her BP on 07/07/18.   informed her that Dr [name] would notified of this information and that the dosage for carvedilol may change.   Update provided to Dr [name].   The patient requested prescriptions be sent to [facility].

## 2018-07-13 ENCOUNTER — Ambulatory Visit (INDEPENDENT_AMBULATORY_CARE_PROVIDER_SITE_OTHER): Payer: Self-pay

## 2018-07-13 ENCOUNTER — Ambulatory Visit (INDEPENDENT_AMBULATORY_CARE_PROVIDER_SITE_OTHER): Payer: Medicare Other | Admitting: Physical Medicine and Rehabilitation

## 2018-07-13 ENCOUNTER — Encounter (INDEPENDENT_AMBULATORY_CARE_PROVIDER_SITE_OTHER): Payer: Self-pay | Admitting: Physical Medicine and Rehabilitation

## 2018-07-13 VITALS — BP 150/94 | HR 87 | Temp 99.3°F

## 2018-07-13 DIAGNOSIS — M5116 Intervertebral disc disorders with radiculopathy, lumbar region: Secondary | ICD-10-CM

## 2018-07-13 DIAGNOSIS — M47816 Spondylosis without myelopathy or radiculopathy, lumbar region: Secondary | ICD-10-CM

## 2018-07-13 DIAGNOSIS — M5416 Radiculopathy, lumbar region: Secondary | ICD-10-CM | POA: Diagnosis not present

## 2018-07-13 DIAGNOSIS — D1779 Benign lipomatous neoplasm of other sites: Secondary | ICD-10-CM

## 2018-07-13 MED ORDER — METHYLPREDNISOLONE ACETATE 80 MG/ML IJ SUSP
80.0000 mg | Freq: Once | INTRAMUSCULAR | Status: AC
  Administered 2018-07-13: 80 mg

## 2018-07-13 NOTE — Patient Instructions (Signed)

## 2018-07-13 NOTE — Progress Notes (Signed)
   Numeric Pain Rating Scale and Functional Assessment Average Pain 10   In the last MONTH (on 0-10 scale) has pain interfered with the following?  1. General activity like being  able to carry out your everyday physical activities such as walking, climbing stairs, carrying groceries, or moving a chair?  Rating(8)   +Driver, -BT, -Dye Allergies.  

## 2018-07-14 DIAGNOSIS — G43119 Migraine with aura, intractable, without status migrainosus: Secondary | ICD-10-CM | POA: Diagnosis not present

## 2018-07-14 DIAGNOSIS — M1991 Primary osteoarthritis, unspecified site: Secondary | ICD-10-CM | POA: Diagnosis not present

## 2018-07-14 DIAGNOSIS — E1142 Type 2 diabetes mellitus with diabetic polyneuropathy: Secondary | ICD-10-CM | POA: Diagnosis not present

## 2018-07-14 DIAGNOSIS — I10 Essential (primary) hypertension: Secondary | ICD-10-CM | POA: Diagnosis not present

## 2018-07-14 DIAGNOSIS — J4540 Moderate persistent asthma, uncomplicated: Secondary | ICD-10-CM | POA: Diagnosis not present

## 2018-07-14 DIAGNOSIS — F329 Major depressive disorder, single episode, unspecified: Secondary | ICD-10-CM | POA: Diagnosis not present

## 2018-07-16 DIAGNOSIS — E1142 Type 2 diabetes mellitus with diabetic polyneuropathy: Secondary | ICD-10-CM | POA: Diagnosis not present

## 2018-07-16 DIAGNOSIS — F329 Major depressive disorder, single episode, unspecified: Secondary | ICD-10-CM | POA: Diagnosis not present

## 2018-07-16 DIAGNOSIS — M1991 Primary osteoarthritis, unspecified site: Secondary | ICD-10-CM | POA: Diagnosis not present

## 2018-07-16 DIAGNOSIS — J4540 Moderate persistent asthma, uncomplicated: Secondary | ICD-10-CM | POA: Diagnosis not present

## 2018-07-16 DIAGNOSIS — I10 Essential (primary) hypertension: Secondary | ICD-10-CM | POA: Diagnosis not present

## 2018-07-16 DIAGNOSIS — G43119 Migraine with aura, intractable, without status migrainosus: Secondary | ICD-10-CM | POA: Diagnosis not present

## 2018-07-19 ENCOUNTER — Telehealth: Payer: Self-pay | Admitting: Family Medicine

## 2018-07-19 ENCOUNTER — Ambulatory Visit
Admission: RE | Admit: 2018-07-19 | Discharge: 2018-07-19 | Disposition: A | Discharge status: Home / Self Care | Payer: Medicare Other | Source: Ambulatory Visit | Attending: Orthopedic Surgery | Admitting: Orthopedic Surgery

## 2018-07-19 DIAGNOSIS — M25511 Pain in right shoulder: Secondary | ICD-10-CM | POA: Diagnosis not present

## 2018-07-19 DIAGNOSIS — G8929 Other chronic pain: Secondary | ICD-10-CM

## 2018-07-19 MED ORDER — IOPAMIDOL (ISOVUE-M 200) INJECTION 41%
15.0000 mL | Freq: Once | INTRAMUSCULAR | Status: AC
  Administered 2018-07-19: 15 mL via INTRA_ARTICULAR

## 2018-07-19 NOTE — Telephone Encounter (Signed)
Patient needs a refill om the

## 2018-07-19 NOTE — Telephone Encounter (Signed)
[facility] needs verbal orders. PT twice a week for 4 weeks and also a speech therapist evaluation. It is ok to lvm.

## 2018-07-20 ENCOUNTER — Telehealth: Payer: Self-pay | Admitting: Family Medicine

## 2018-07-20 ENCOUNTER — Other Ambulatory Visit: Payer: Self-pay

## 2018-07-20 MED ORDER — FUROSEMIDE 20 MG PO TABS: 20.0000 mg | ORAL_TABLET | Freq: Every day | ORAL | 2 refills | Status: DC

## 2018-07-20 NOTE — Telephone Encounter (Signed)
[name] was called and given verbal orders for PT and Speech therapy.

## 2018-07-20 NOTE — Telephone Encounter (Signed)
Anda Kraft with [facility] is calling for verbal orders for physical therapy for twice a week for 4 weeks. She would also like an order for a speech evaluation. Please follow up.

## 2018-07-20 NOTE — Telephone Encounter (Signed)
[name] says her line is secure and confidential and orders can be left at her VM.

## 2018-07-20 NOTE — Telephone Encounter (Signed)
Call was placed to [facility] and there was no answer or a voicemail set up to leave a message.

## 2018-07-21 ENCOUNTER — Encounter (INDEPENDENT_AMBULATORY_CARE_PROVIDER_SITE_OTHER): Payer: Self-pay | Admitting: Physical Medicine and Rehabilitation

## 2018-07-21 ENCOUNTER — Encounter (INDEPENDENT_AMBULATORY_CARE_PROVIDER_SITE_OTHER): Payer: Self-pay | Admitting: Orthopedic Surgery

## 2018-07-21 ENCOUNTER — Ambulatory Visit (INDEPENDENT_AMBULATORY_CARE_PROVIDER_SITE_OTHER): Payer: Medicare Other | Admitting: Orthopedic Surgery

## 2018-07-21 DIAGNOSIS — M7541 Impingement syndrome of right shoulder: Secondary | ICD-10-CM

## 2018-07-21 MED ORDER — BUPIVACAINE HCL 0.5 % IJ SOLN
9.0000 mL | INTRAMUSCULAR | Status: AC | PRN
  Administered 2018-07-21: 9 mL via INTRA_ARTICULAR

## 2018-07-21 MED ORDER — LIDOCAINE HCL 1 % IJ SOLN
5.0000 mL | INTRAMUSCULAR | Status: AC | PRN
  Administered 2018-07-21: 5 mL

## 2018-07-21 NOTE — Procedures (Signed)
Lumbar Epidural Steroid Injection - Interlaminar Approach with Fluoroscopic Guidance  Patient: [name]      Date of Birth: May 12, 1971 MRN: [id number] PCP: [name], MD      Visit Date: 07/13/2018   Universal Protocol:     Consent Given By: the patient  Position: PRONE  Additional Comments: Vital signs were monitored before and after the procedure. Patient was prepped and draped in the usual sterile fashion. The correct patient, procedure, and site was verified.   Injection Procedure Details:  Procedure Site One Meds Administered:  Meds ordered this encounter  Medications  . methylPREDNISolone acetate (DEPO-MEDROL) injection 80 mg     Laterality: Right  Location/Site:  L4-L5  Needle size: 20 G  Needle type: Tuohy  Needle Placement: Paramedian epidural  Findings:   -Comments: Excellent flow of contrast into the epidural space.  Procedure Details: Using a paramedian approach from the side mentioned above, the region overlying the inferior lamina was localized under fluoroscopic visualization and the soft tissues overlying this structure were infiltrated with 4 ml. of 1% Lidocaine without Epinephrine. The Tuohy needle was inserted into the epidural space using a paramedian approach.   The epidural space was localized using loss of resistance along with lateral and bi-planar fluoroscopic views.  After negative aspirate for air, blood, and CSF, a 2 ml. volume of Isovue-250 was injected into the epidural space and the flow of contrast was observed. Radiographs were obtained for documentation purposes.    The injectate was administered into the level noted above.   Additional Comments:  The patient tolerated the procedure well Dressing: Band-Aid    Post-procedure details: Patient was observed during the procedure. Post-procedure instructions were reviewed.  Patient left the clinic in stable condition.

## 2018-07-21 NOTE — Progress Notes (Signed)
Office Visit Note   Patient: [name]           Date of Birth: [birth year]           MRN: [id number] Visit Date: 07/21/2018 Requested by: [name], MD [place],  [id number] PCP: [name], MD  Subjective: Chief Complaint  Patient presents with  . Right Shoulder - Follow-up    HPI: [name] is a patient with right shoulder pain.  Since I seen her she is had an MRI scan.  I reviewed the scan with her today showing her the pictures.  She does have partial-thickness rotator cuff tear but in general of the labrum looks intact.  Bursitis is present.  Not no significant arthritis is present.  She is having continued right shoulder pain.  Her hemoglobin A1c is significantly elevated.              ROS: All systems reviewed are negative as they relate to the chief complaint within the history of present illness.  Patient denies  fevers or chills.   Assessment & Plan: Visit Diagnoses:  1. Impingement syndrome of right shoulder     Plan: Impression is impingement syndrome right shoulder.  Plan is right shoulder injection with 9 cc of Marcaine and 1 cc of Toradol.  I think that has the potential to help decrease inflammation without affecting her glucose and an adverse fashion.  Patient does have a tracheostomy.We discussed cortisone injection versus Toradol injection.  Toradol has low probability of affecting her asthma particularly with a subacromial injection.  I think that is her best alternative at this time for some degree of pain relief.  I do not think she has a surgical problem in the shoulder.  I will see her back in about 8 weeks for recheck.  Follow-Up Instructions: Return in about 8 weeks (around 09/15/2018).   Orders:  No orders of the defined types were placed in this encounter.  No orders of the defined types were placed in this encounter.     Procedures: Large Joint Inj: R subacromial bursa on 07/21/2018 12:08  PM Indications: diagnostic evaluation and pain Details: 18 G 1.5 in needle, posterior approach  Arthrogram: No  Medications: 9 mL bupivacaine 0.5 %; 5 mL lidocaine 1 % Outcome: tolerated well, no immediate complications Procedure, treatment alternatives, risks and benefits explained, specific risks discussed. Consent was given by the patient. Immediately prior to procedure a time out was called to verify the correct patient, procedure, equipment, support staff and site/side marked as required. Patient was prepped and draped in the usual sterile fashion.     30 mg Toradol injected with 9 cc of Marcaine  Clinical Data: No additional findings.  Objective: Vital Signs: There were no vitals taken for this visit.  Physical Exam:   Constitutional: Patient appears well-developed HEENT:  Head: Normocephalic Eyes:EOM are normal Neck: Normal range of motion Cardiovascular: Normal rate Pulmonary/chest: Effort normal Neurologic: Patient is alert Skin: Skin is warm Psychiatric: Patient has normal mood and affect    Ortho Exam: Ortho exam demonstrates fairly good passive range of motion of the right shoulder with positive impingement signs.  No AC joint tenderness to direct palpation.  Rotator cuff strength is good.  No other masses lymph adenopathy or skin changes noted in that shoulder girdle region  Specialty Comments:  No specialty comments available.  Imaging: No results found.   PMFS History: Patient Active Problem List   Diagnosis Date Noted  .  Rotator cuff arthropathy, right 06/15/2018  . AKI (acute kidney injury) ([place]) 03/06/2018  . Carpal tunnel syndrome 01/18/2018  . Acute respiratory distress 06/02/2017  . Elevated lactic acid level 06/02/2017  . Sepsis ([place]) 05/07/2017  . Productive cough 05/06/2017  . Normocytic anemia 05/06/2017  . Hypokalemia 05/06/2017  . Tracheostomy status ([place]) 04/28/2017  . Tracheal stenosis 03/04/2017  . Acute blood loss anemia   .  Generalized anxiety disorder   . Hypoalbuminemia due to protein-calorie malnutrition ([place])   . Severe asthma with acute exacerbation   . Type 2 diabetes mellitus with diabetic polyneuropathy, without long-term current use of insulin ([place])   . Morbid obesity ([place])   . Chronic pain syndrome   . Acute renal failure ([place])   . Gastritis and gastroduodenitis 02/22/2016  . Depression 02/22/2016  . Migraine 01/30/2015  . Bulging lumbar disc 09/05/2013  . Essential hypertension 09/05/2013  . Acute bronchitis 05/01/2013  . Asthma exacerbation 04/27/2013  . Allergic rhinitis, seasonal 08/11/2012  . Chronic cough 08/11/2012  . Sleep apnea, obstructive 12/04/2011   Past Medical History:  Diagnosis Date  . Arthritis   . Asthma   . Chronic back pain   . Chronic chest pain   . Depression   . DM (diabetes mellitus) ([place])    INSULIN DEPENDENT  . GERD (gastroesophageal reflux disease)   . Headache(784.0)   . Hypertension   . Hypokalemia   . Respiratory disease 05/2017  . Tracheostomy in place [facility]) 02/2017    Family History  Problem Relation Age of Onset  . Heart attack Mother   . Stroke Mother   . Diabetes Mother   . Hypertension Mother   . Arthritis Mother   . Stroke Father   . Hypertension Sister   . Diabetes Sister   . Seizures Brother   . Diabetes Brother     Past Surgical History:  Procedure Laterality Date  . APPENDECTOMY    . CESAREAN SECTION     x 3  . CHOLECYSTECTOMY N/A 03/02/2014   Procedure: LAPAROSCOPIC CHOLECYSTECTOMY;  Surgeon: Joyice Faster. Cornett, MD;  Location: [facility];  Service: General;  Laterality: N/A;  . HERNIA REPAIR    . PANENDOSCOPY N/A 03/04/2017   Procedure: PANENDOSCOPY WITH POSSIBLE FOREIGN BODY REMOVAL;  Surgeon: Jodi Marble, MD;  Location: [facility];  Service: ENT;  Laterality: N/A;  . ROTATOR CUFF REPAIR    . TRACHEOSTOMY  02/2017  . VESICOVAGINAL FISTULA CLOSURE W/ TAH  2009   Social History   Occupational History  . Not on file  Tobacco Use    . Smoking status: Former Smoker    Packs/day: 0.25    Years: 22.00    Pack years: 5.50    Types: Cigarettes    Last attempt to quit: 01/20/2017    Years since quitting: 1.4  . Smokeless tobacco: Never Used  Substance and Sexual Activity  . Alcohol use: No    Alcohol/week: 1.0 - 2.0 standard drinks    Types: 1 - 2 Glasses of wine per week    Comment: socially  . Drug use: No  . Sexual activity: Yes    Partners: Male    Birth control/protection: None

## 2018-07-21 NOTE — Progress Notes (Signed)
[name] - 47 y.o. female MRN [id number]  Date of birth: 12-02-1970  Office Visit Note: Visit Date: 07/13/2018 PCP: [name], MD Referred by: [name], MD  Subjective: Chief Complaint  Patient presents with  . Lower Back - Pain  . Right Leg - Pain, Numbness, Tingling   HPI: [name] is a 47 y.o. female who comes in today For evaluation and management of low back and radicular type leg pain as requested by Dr. Anderson Malta.  He has been following her from orthopedic standpoint for mainly her shoulder.  He did note that she has had chronic worsening low back pain for many years.  Today she tells me that she has had at least 2 years of worsening low back pain with some pain in the right leg with paresthesia.  No pain down the left leg.  No specific injury noted.  No prior lumbar surgery.  She has unfortunately a host of medical problems which are reviewed below.  Complicating factors include morbid obesity and respiratory failure along with type 2 diabetes with some recent increase in blood sugar value.  She has no history of fibromyalgia.  She has had physical therapy in the past but not recently and does try to stay active.  She rates her pain as a 10 out of 10.  Her pain is with walking and standing more than sitting.  She reports too much activity does seem to increase her pain and she cannot pace for what she does.  She does get some relief with heat and ice and medication.  She is taking muscle relaxer as well as Topamax and gabapentin.  She does report that it does limit her daily activities.  She has had no change in bowel or bladder function.  She has had no specific night pain or other worrisome signs or symptoms.  Again as noted MRI has been completed and this is reviewed with the patient and reviewed below.  We did look at the spine models today.  Review of Systems  Constitutional: Negative for chills, fever, malaise/fatigue and  weight loss.  HENT: Negative for hearing loss and sinus pain.   Eyes: Negative for blurred vision, double vision and photophobia.  Respiratory: Positive for shortness of breath. Negative for cough.   Cardiovascular: Negative for chest pain, palpitations and leg swelling.  Gastrointestinal: Negative for abdominal pain, nausea and vomiting.  Genitourinary: Negative for flank pain.  Musculoskeletal: Positive for back pain and joint pain. Negative for myalgias.  Skin: Negative for itching and rash.  Neurological: Positive for tingling and weakness. Negative for tremors and focal weakness.  Endo/Heme/Allergies: Negative.   Psychiatric/Behavioral: Negative for depression.  All other systems reviewed and are negative.  Otherwise per HPI.  Assessment & Plan: Visit Diagnoses:  1. Lumbar radiculopathy   2. Radiculopathy due to lumbar intervertebral disc disorder   3. Spondylosis without myelopathy or radiculopathy, lumbar region   4. Epidural lipomatosis     Plan: Findings:  Chronic 2-year history of worsening severe low back pain and right radicular leg pain.  MRI findings are consistent with mainly issues at the L4-5 level with disc protrusion coupled with severe facet arthropathy causing some stenosis and narrowing.  Central canal is still open.  There is epidural lipomatosis.  I think her biggest complaint is the hip and leg pain which is a paresthesia type pain I think a lumbar epidural injection would help diagnostically and therapeutically.  I think we can do  this 1 time with reduced amount of steroid and have her monitor her blood sugar.  If her leg pain is greatly relieved we could look at diagnostic medial branch blocks of the facet joints which would not require any steroid she might be a candidate for radiofrequency ablation although it would be hard with her body habitus.  We talked about epidural lipomatosis and the fact that the only real treatment for that is probably weight loss.  She  has had difficulty with weight loss to to the diabetes as well as medications for the breathing difficulties.  She currently is on decent medication for nerve type pain although those could be tweaked to a degree.  She has complicated with multiple medical issues.  Duda the severity of her symptoms I did complete the epidural injection today.    Meds & Orders:  Meds ordered this encounter  Medications  . methylPREDNISolone acetate (DEPO-MEDROL) injection 80 mg    Orders Placed This Encounter  Procedures  . XR C-ARM NO REPORT  . Epidural Steroid injection    Follow-up: Return if symptoms worsen or fail to improve.   Procedures: No procedures performed  Lumbar Epidural Steroid Injection - Interlaminar Approach with Fluoroscopic Guidance  Patient: [name]      Date of Birth: 1970/09/29 MRN: [id number] PCP: [name], MD      Visit Date: 07/13/2018   Universal Protocol:     Consent Given By: the patient  Position: PRONE  Additional Comments: Vital signs were monitored before and after the procedure. Patient was prepped and draped in the usual sterile fashion. The correct patient, procedure, and site was verified.   Injection Procedure Details:  Procedure Site One Meds Administered:  Meds ordered this encounter  Medications  . methylPREDNISolone acetate (DEPO-MEDROL) injection 80 mg     Laterality: Right  Location/Site:  L4-L5  Needle size: 20 G  Needle type: Tuohy  Needle Placement: Paramedian epidural  Findings:   -Comments: Excellent flow of contrast into the epidural space.  Procedure Details: Using a paramedian approach from the side mentioned above, the region overlying the inferior lamina was localized under fluoroscopic visualization and the soft tissues overlying this structure were infiltrated with 4 ml. of 1% Lidocaine without Epinephrine. The Tuohy needle was inserted into the epidural space using a paramedian approach.    The epidural space was localized using loss of resistance along with lateral and bi-planar fluoroscopic views.  After negative aspirate for air, blood, and CSF, a 2 ml. volume of Isovue-250 was injected into the epidural space and the flow of contrast was observed. Radiographs were obtained for documentation purposes.    The injectate was administered into the level noted above.   Additional Comments:  The patient tolerated the procedure well Dressing: Band-Aid    Post-procedure details: Patient was observed during the procedure. Post-procedure instructions were reviewed.  Patient left the clinic in stable condition.   Clinical History: MRI LUMBAR SPINE WITHOUT CONTRAST  TECHNIQUE: Multiplanar, multisequence MR imaging of the lumbar spine was performed. No intravenous contrast was administered.  COMPARISON:  Radiography 07/08/2016.  MRI 09/30/2013.  FINDINGS: Segmentation:  5 lumbar type vertebral bodies.  Alignment:  Minimal curvature convex to the right.  Vertebrae:  No fracture or primary bone lesion.  Conus medullaris: Extends to the L1 level and appears normal.  Paraspinal and other soft tissues: Negative  Disc levels:  No abnormality at L3-4 or above. The discs are normal. The canal and foramina are widely  patent. The distal cord and conus are normal.  L4-5: Disc degeneration with shallow broad-based disc herniation with slight upward turning. Facet arthropathy with fluid-filled joints. Abundant epidural fat. Mild stenosis of the lateral recesses without definite neural compression. Anterolisthesis would likely occur with standing or flexion.  L5-S1: No disc pathology.  Minimal facet hypertrophy.  No stenosis.  Compared to the study of 2015, the appearance of the disc is unchanged. The facet arthropathy is worsened slightly.  IMPRESSION: L4-5: Shallow broad-based disc herniation with slight upward turning, unchanged since 2015. Bilateral  facet arthropathy with fluid-filled joints, worsened since 2015. Abundant epidural fat. Mild constriction of the thecal sac without definite neural compression. This appearance could worsen with standing or flexion based on the morphology of the facet arthropathy.   Electronically Signed   By: Nelson Chimes M.D.   On: 12/17/2016 14:14   She reports that she quit smoking about 17 months ago. Her smoking use included cigarettes. She has a 5.50 pack-year smoking history. She has never used smokeless tobacco.  Recent Labs    11/27/17 1428 02/19/18 1105 06/15/18 0936  HGBA1C 11.8 12.0* 12.5*    Objective:  VS:  HT:    WT:   BMI:     BP:(!) 150/94  HR:87bpm  TEMP:99.3 F (37.4 C)(Oral)  RESP:  Physical Exam  Constitutional: She is oriented to person, place, and time. She appears well-developed and well-nourished. No distress.  Obese  HENT:  Head: Normocephalic and atraumatic.  Nose: Nose normal.  Eyes: Pupils are equal, round, and reactive to light. Conjunctivae are normal.  Neck: Normal range of motion. Neck supple.  Cardiovascular: Regular rhythm and intact distal pulses.  Pulmonary/Chest: Effort normal. No respiratory distress.  Abdominal: Soft. She exhibits no distension. There is no guarding.  Musculoskeletal:  Patient has pain with going from sit to stand which she does with some difficulty.  She has pain with facet joint loading of the lumbar spine.  She has no real pain over the greater trochanters or PSIS.  No pain with hip rotation she has good distal strength of the lower extremities without clonus.  Neurological: She is alert and oriented to person, place, and time. She exhibits normal muscle tone. Coordination normal.  Skin: Skin is warm. No rash noted. No erythema.  Psychiatric: She has a normal mood and affect. Her behavior is normal.  Nursing note and vitals reviewed.   Ortho Exam Imaging: No results found.  Past Medical/Family/Surgical/Social  History: Medications & Allergies reviewed per EMR, new medications updated. Patient Active Problem List   Diagnosis Date Noted  . Rotator cuff arthropathy, right 06/15/2018  . AKI (acute kidney injury) ([place]) 03/06/2018  . Carpal tunnel syndrome 01/18/2018  . Acute respiratory distress 06/02/2017  . Elevated lactic acid level 06/02/2017  . Sepsis ([place]) 05/07/2017  . Productive cough 05/06/2017  . Normocytic anemia 05/06/2017  . Hypokalemia 05/06/2017  . Tracheostomy status ([place]) 04/28/2017  . Tracheal stenosis 03/04/2017  . Acute blood loss anemia   . Generalized anxiety disorder   . Hypoalbuminemia due to protein-calorie malnutrition ([place])   . Severe asthma with acute exacerbation   . Type 2 diabetes mellitus with diabetic polyneuropathy, without long-term current use of insulin ([place])   . Morbid obesity ([place])   . Chronic pain syndrome   . Acute renal failure ([place])   . Gastritis and gastroduodenitis 02/22/2016  . Depression 02/22/2016  . Migraine 01/30/2015  . Bulging lumbar disc 09/05/2013  . Essential hypertension 09/05/2013  .  Acute bronchitis 05/01/2013  . Asthma exacerbation 04/27/2013  . Allergic rhinitis, seasonal 08/11/2012  . Chronic cough 08/11/2012  . Sleep apnea, obstructive 12/04/2011   Past Medical History:  Diagnosis Date  . Arthritis   . Asthma   . Chronic back pain   . Chronic chest pain   . Depression   . DM (diabetes mellitus) ([place])    INSULIN DEPENDENT  . GERD (gastroesophageal reflux disease)   . Headache(784.0)   . Hypertension   . Hypokalemia   . Respiratory disease 05/2017  . Tracheostomy in place [facility]) 02/2017   Family History  Problem Relation Age of Onset  . Heart attack Mother   . Stroke Mother   . Diabetes Mother   . Hypertension Mother   . Arthritis Mother   . Stroke Father   . Hypertension Sister   . Diabetes Sister   . Seizures Brother   . Diabetes Brother    Past Surgical History:  Procedure Laterality Date  .  APPENDECTOMY    . CESAREAN SECTION     x 3  . CHOLECYSTECTOMY N/A 03/02/2014   Procedure: LAPAROSCOPIC CHOLECYSTECTOMY;  Surgeon: Joyice Faster. Cornett, MD;  Location: [facility];  Service: General;  Laterality: N/A;  . HERNIA REPAIR    . PANENDOSCOPY N/A 03/04/2017   Procedure: PANENDOSCOPY WITH POSSIBLE FOREIGN BODY REMOVAL;  Surgeon: Jodi Marble, MD;  Location: [facility];  Service: ENT;  Laterality: N/A;  . ROTATOR CUFF REPAIR    . TRACHEOSTOMY  02/2017  . VESICOVAGINAL FISTULA CLOSURE W/ TAH  2009   Social History   Occupational History  . Not on file  Tobacco Use  . Smoking status: Former Smoker    Packs/day: 0.25    Years: 22.00    Pack years: 5.50    Types: Cigarettes    Last attempt to quit: 01/20/2017    Years since quitting: 1.4  . Smokeless tobacco: Never Used  Substance and Sexual Activity  . Alcohol use: No    Alcohol/week: 1.0 - 2.0 standard drinks    Types: 1 - 2 Glasses of wine per week    Comment: socially  . Drug use: No  . Sexual activity: Yes    Partners: Male    Birth control/protection: None

## 2018-07-22 ENCOUNTER — Telehealth: Payer: Self-pay | Admitting: Family Medicine

## 2018-07-22 DIAGNOSIS — G43119 Migraine with aura, intractable, without status migrainosus: Secondary | ICD-10-CM

## 2018-07-22 DIAGNOSIS — E1142 Type 2 diabetes mellitus with diabetic polyneuropathy: Secondary | ICD-10-CM | POA: Diagnosis not present

## 2018-07-22 DIAGNOSIS — M1991 Primary osteoarthritis, unspecified site: Secondary | ICD-10-CM | POA: Diagnosis not present

## 2018-07-22 DIAGNOSIS — F329 Major depressive disorder, single episode, unspecified: Secondary | ICD-10-CM | POA: Diagnosis not present

## 2018-07-22 DIAGNOSIS — J4540 Moderate persistent asthma, uncomplicated: Secondary | ICD-10-CM | POA: Diagnosis not present

## 2018-07-22 DIAGNOSIS — I10 Essential (primary) hypertension: Secondary | ICD-10-CM | POA: Diagnosis not present

## 2018-07-22 NOTE — Telephone Encounter (Signed)
April with [facility] called to update PCP about her visit. She says patient told her she had a migraine. Patient was rating the pain as 9/10. April checked her BP and it was 170/110. Patient said she was laying down for most of the day and had just taken BP pills when April arrived. April with [facility] stayed for a bit and rechecked it and it lowered down to 150/88. Patient said headaches were easing up. April informed patient of stroke signs. Patient does say she is in a lot of pain and is set to go to her appointment on Monday.

## 2018-07-22 NOTE — Telephone Encounter (Signed)
Speech therapy 1 time a week for 3 weeks  Also Can she take orders from Dr. Donald Pore    She want to see her for passy-muir valve speaking valve. She also wants to know where she orders her trach supplies.

## 2018-07-23 NOTE — Telephone Encounter (Signed)
Will route to PCP for review. 

## 2018-07-23 NOTE — Telephone Encounter (Signed)
I have referred her to neurology for optimization of migraine.  Please advised to continue with Topamax and Imitrex.

## 2018-07-23 NOTE — Telephone Encounter (Signed)
Okay to give orders.  [name] supplies should be coming from ENT and she can also take orders from her ENT specialist.

## 2018-07-23 NOTE — Telephone Encounter (Signed)
Will route to PCP for review. Ok to give verbal orders for everything?

## 2018-07-23 NOTE — Telephone Encounter (Signed)
erin was called and given verbal orders.

## 2018-07-26 ENCOUNTER — Encounter: Payer: Self-pay | Admitting: Physical Medicine & Rehabilitation

## 2018-07-26 ENCOUNTER — Other Ambulatory Visit: Payer: Self-pay

## 2018-07-26 ENCOUNTER — Encounter: Payer: Medicare Other | Attending: Physical Medicine & Rehabilitation

## 2018-07-26 ENCOUNTER — Telehealth: Payer: Self-pay | Admitting: Family Medicine

## 2018-07-26 ENCOUNTER — Ambulatory Visit (HOSPITAL_BASED_OUTPATIENT_CLINIC_OR_DEPARTMENT_OTHER): Payer: Medicare Other | Admitting: Physical Medicine & Rehabilitation

## 2018-07-26 VITALS — BP 148/88 | HR 84 | Ht 61.5 in | Wt 246.0 lb

## 2018-07-26 DIAGNOSIS — M25511 Pain in right shoulder: Secondary | ICD-10-CM | POA: Insufficient documentation

## 2018-07-26 DIAGNOSIS — M5416 Radiculopathy, lumbar region: Secondary | ICD-10-CM

## 2018-07-26 NOTE — Telephone Encounter (Signed)
[name] was called and given verbal orders for OT for 2x a week for 4 weeks.

## 2018-07-26 NOTE — Progress Notes (Signed)
Subjective:    Patient ID: [name], female    DOB: [birth year], 47 y.o.   MRN: [id number] PM and R consultation requested by Dr Margarita Rana HPI 47 yo female with hx of morbid obesity, s/p tracheostomy (following intubation after asthma attack), who has a chief complaint of Right sided low back pain radiating into the Right thigh, ~43yr duration. Patient without history of recent trauma.  She had a new MRI of the lumbar spine dated 12/17/2016 demonstrating L4-5 facet arthropathy with effusion, there is also mild arthropathy at L5-S1 without joint effusion Patient has seen physical medicine rehabilitation Dr. Laroy Apple for what sownds like an epidural injection.  Patient was also seen by Dr. Laurence Spates who performed left L4-L5 interlaminar epidural steroid injection under fluoroscopic guidance, the patient states it was not very helpful. Also has Right shoulder pain with recent MRI showing partial thickness tear of supraspinatus tendon.  Follows with Dr Marlou Sa for this problem On T#3 per PCP On gabapentin 600mg  BID Pain Inventory Average Pain 9 Pain Right Now 9 My pain is sharp, dull, stabbing, tingling and aching  In the last 24 hours, has pain interfered with the following? General activity 8 Relation with others 6 Enjoyment of life 8 What TIME of day is your pain at its worst? all Sleep (in general) Poor  Pain is worse with: walking, bending, sitting, inactivity, standing and some activites Pain improves with: rest, heat/ice, medication and injections Relief from Meds: 4  Mobility walk without assistance walk with assistance use a cane how many minutes can you walk? 10 ability to climb steps?  yes do you drive?  no  Function disabled: date disabled n/a I need assistance with the following:  dressing, household duties and shopping  Neuro/Psych weakness numbness tingling dizziness depression loss of taste or smell  Prior Studies CT/MRI  Physicians  involved in your care Any changes since last visit?  yes   Family History  Problem Relation Age of Onset  . Heart attack Mother   . Stroke Mother   . Diabetes Mother   . Hypertension Mother   . Arthritis Mother   . Stroke Father   . Hypertension Sister   . Diabetes Sister   . Seizures Brother   . Diabetes Brother    Social History   Socioeconomic History  . Marital status: Married    Spouse name: Not on file  . Number of children: Not on file  . Years of education: Not on file  . Highest education level: Not on file  Occupational History  . Not on file  Social Needs  . Financial resource strain: Not on file  . Food insecurity:    Worry: Not on file    Inability: Not on file  . Transportation needs:    Medical: Not on file    Non-medical: Not on file  Tobacco Use  . Smoking status: Former Smoker    Packs/day: 0.25    Years: 22.00    Pack years: 5.50    Types: Cigarettes    Last attempt to quit: 01/20/2017    Years since quitting: 1.5  . Smokeless tobacco: Never Used  Substance and Sexual Activity  . Alcohol use: No    Alcohol/week: 1.0 - 2.0 standard drinks    Types: 1 - 2 Glasses of wine per week    Comment: socially  . Drug use: No  . Sexual activity: Yes    Partners: Male    Birth control/protection: None  Lifestyle  . Physical activity:    Days per week: Not on file    Minutes per session: Not on file  . Stress: Not on file  Relationships  . Social connections:    Talks on phone: Not on file    Gets together: Not on file    Attends religious service: Not on file    Active member of club or organization: Not on file    Attends meetings of clubs or organizations: Not on file    Relationship status: Not on file  Other Topics Concern  . Not on file  Social History Narrative  . Not on file   Past Surgical History:  Procedure Laterality Date  . APPENDECTOMY    . CESAREAN SECTION     x 3  . CHOLECYSTECTOMY N/A 03/02/2014   Procedure: LAPAROSCOPIC  CHOLECYSTECTOMY;  Surgeon: Joyice Faster. Cornett, MD;  Location: [facility];  Service: General;  Laterality: N/A;  . HERNIA REPAIR    . PANENDOSCOPY N/A 03/04/2017   Procedure: PANENDOSCOPY WITH POSSIBLE FOREIGN BODY REMOVAL;  Surgeon: Jodi Marble, MD;  Location: [facility];  Service: ENT;  Laterality: N/A;  . ROTATOR CUFF REPAIR    . TRACHEOSTOMY  02/2017  . VESICOVAGINAL FISTULA CLOSURE W/ TAH  2009   Past Medical History:  Diagnosis Date  . Arthritis   . Asthma   . Chronic back pain   . Chronic chest pain   . Depression   . DM (diabetes mellitus) ([place])    INSULIN DEPENDENT  . GERD (gastroesophageal reflux disease)   . Headache(784.0)   . Hypertension   . Hypokalemia   . Respiratory disease 05/2017  . Tracheostomy in place ([place]) 02/2017   BP (!) 148/88   Pulse 84   Ht 5' 1.5" (1.562 m)   Wt 246 lb (111.6 kg)   SpO2 97%   BMI 45.73 kg/m   Opioid Risk Score:   Fall Risk Score:  `1  Depression screen PHQ 2/9  Depression screen [facility] 2/9 07/26/2018 06/15/2018 04/12/2018 02/19/2018 10/15/2017 05/14/2017 04/28/2017  Decreased Interest 1 1 2 2 1 3 2   Down, Depressed, Hopeless 3 2 3 3 2 3 2   PHQ - 2 Score 4 3 5 5 3 6 4   Altered sleeping 1 1 1 2 3 2 1   Tired, decreased energy 2 1 2 2 3 3 2   Change in appetite 1 1 1 1 1 3  0  Feeling bad or failure about yourself  1 3 3 3 3 1 2   Trouble concentrating 1 1 1 1  0 1 0  Moving slowly or fidgety/restless 0 0 1 1 1 1 1   Suicidal thoughts 0 0 0 0 0 0 0  PHQ-9 Score 10 10 14 15 14 17 10   Difficult doing work/chores Very difficult - - - - - -  Some recent data might be hidden    Review of Systems  Constitutional: Positive for diaphoresis.  HENT: Negative.   Eyes: Negative.   Respiratory: Positive for cough and wheezing.   Gastrointestinal: Positive for abdominal pain and nausea.  Endocrine: Negative.   Genitourinary: Negative.   Musculoskeletal: Negative.   Skin: Negative.   Allergic/Immunologic: Negative.   Neurological: Positive for  dizziness, weakness and numbness.  Psychiatric/Behavioral: Negative.   All other systems reviewed and are negative.      Objective:   Physical Exam  Constitutional: She is oriented to person, place, and time. She appears well-developed and well-nourished. No distress.  HENT:  Head: Normocephalic and atraumatic.  Tracheostomy  Eyes: Pupils are equal, round, and reactive to light. EOM are normal.  Neck: Normal range of motion.  Cardiovascular: Normal rate, regular rhythm and normal heart sounds.  No murmur heard. Pulmonary/Chest: Effort normal and breath sounds normal. No stridor. No respiratory distress. She has no wheezes.  Abdominal: Soft. Bowel sounds are normal. She exhibits no distension. There is no tenderness.  Neurological: She is alert and oriented to person, place, and time.  Skin: Skin is warm and dry. She is not diaphoretic.  Psychiatric: She has a normal mood and affect.  Nursing note and vitals reviewed.  Had to remove inner cannula because of coughing during the exam  Sensation intact Right L4-5-S1 DTR normal bilateral Patellar and Achillies Negative straight leg raising  Motor strength is 5/5 bilateral deltoid bicep tricep grip hip flexion knee extension ankle dorsiflexion  Limited active range of motion right shoulder due to pain  Patient has pain with lumbar range of motion, flexion extension lateral bending and rotation, extension causes the most pain.  Range of motion is approximately 50% in all directions.    Assessment & Plan:  1.  Chronic right-sided low back pain with right-sided thigh pain No other clear-cut signs of radiculopathy.  She does have pain with extension as well as evidence of effusion at L4-5 facet joints on her most recent MRI. We discussed that her medication management appears appropriate given her comorbidities.  I would not increase her to any higher dose narcotic analgesics.  She could be empirically tried on Lyrica in place of  gabapentin but I would defer to her primary care physician on this. Given symptoms and MRI findings may trial Lumbar medial branch blocks.

## 2018-07-26 NOTE — Telephone Encounter (Signed)
[name] from [facility] called requesting verbal orders for OT 2X a week for 4 weeks for shoulder rehab. Patient orthopedic said that she does not need a f/u appt with but needs to start rehab. Please f/u.

## 2018-07-27 ENCOUNTER — Telehealth: Payer: Self-pay | Admitting: Family Medicine

## 2018-07-27 DIAGNOSIS — M1991 Primary osteoarthritis, unspecified site: Secondary | ICD-10-CM | POA: Diagnosis not present

## 2018-07-27 DIAGNOSIS — E1142 Type 2 diabetes mellitus with diabetic polyneuropathy: Secondary | ICD-10-CM | POA: Diagnosis not present

## 2018-07-27 DIAGNOSIS — J4540 Moderate persistent asthma, uncomplicated: Secondary | ICD-10-CM | POA: Diagnosis not present

## 2018-07-27 DIAGNOSIS — F329 Major depressive disorder, single episode, unspecified: Secondary | ICD-10-CM | POA: Diagnosis not present

## 2018-07-27 DIAGNOSIS — G43119 Migraine with aura, intractable, without status migrainosus: Secondary | ICD-10-CM | POA: Diagnosis not present

## 2018-07-27 DIAGNOSIS — I10 Essential (primary) hypertension: Secondary | ICD-10-CM | POA: Diagnosis not present

## 2018-07-27 NOTE — Telephone Encounter (Signed)
Marjory Lies with [facility] called to report that patient had high blood pressure 150's-160's/110. States patient informed him that pcp is working with her regarding bp issues. Please follow up.

## 2018-07-28 MED ORDER — LISINOPRIL 5 MG PO TABS: 5.0000 mg | ORAL_TABLET | Freq: Every day | ORAL | 3 refills | Status: DC

## 2018-07-28 NOTE — Telephone Encounter (Signed)
Will route to PCP for review. 

## 2018-07-28 NOTE — Telephone Encounter (Signed)
I have sent a prescription for lisinopril to the pharmacy for better blood pressure control.

## 2018-07-29 ENCOUNTER — Telehealth: Payer: Self-pay | Admitting: Family Medicine

## 2018-07-29 ENCOUNTER — Encounter (INDEPENDENT_AMBULATORY_CARE_PROVIDER_SITE_OTHER): Payer: Self-pay | Admitting: Orthopedic Surgery

## 2018-07-29 DIAGNOSIS — E1142 Type 2 diabetes mellitus with diabetic polyneuropathy: Secondary | ICD-10-CM | POA: Diagnosis not present

## 2018-07-29 DIAGNOSIS — I10 Essential (primary) hypertension: Secondary | ICD-10-CM | POA: Diagnosis not present

## 2018-07-29 DIAGNOSIS — M1991 Primary osteoarthritis, unspecified site: Secondary | ICD-10-CM | POA: Diagnosis not present

## 2018-07-29 DIAGNOSIS — J4540 Moderate persistent asthma, uncomplicated: Secondary | ICD-10-CM | POA: Diagnosis not present

## 2018-07-29 DIAGNOSIS — G43119 Migraine with aura, intractable, without status migrainosus: Secondary | ICD-10-CM | POA: Diagnosis not present

## 2018-07-29 DIAGNOSIS — F329 Major depressive disorder, single episode, unspecified: Secondary | ICD-10-CM | POA: Diagnosis not present

## 2018-07-29 NOTE — Telephone Encounter (Signed)
N see note from a kirsteins

## 2018-07-29 NOTE — Telephone Encounter (Signed)
y

## 2018-07-29 NOTE — Telephone Encounter (Signed)
Speech therapist from [facility] called to inform provider that patient refused her visit this morning, If you have any questions please follow up -[phone] p

## 2018-07-29 NOTE — Telephone Encounter (Signed)
Kendrice with [facility] called to report patients BP of 182/96 and patient also reported a Pain level of 8 (mirgrains). Please follow up.

## 2018-07-30 ENCOUNTER — Encounter: Payer: Self-pay | Admitting: Endocrinology

## 2018-07-30 ENCOUNTER — Ambulatory Visit (INDEPENDENT_AMBULATORY_CARE_PROVIDER_SITE_OTHER): Payer: Medicare Other | Admitting: Endocrinology

## 2018-07-30 VITALS — BP 142/70 | HR 112 | Ht 61.5 in | Wt 246.0 lb

## 2018-07-30 DIAGNOSIS — E1142 Type 2 diabetes mellitus with diabetic polyneuropathy: Secondary | ICD-10-CM

## 2018-07-30 LAB — POCT GLYCOSYLATED HEMOGLOBIN (HGB A1C): HEMOGLOBIN A1C: 12.5 % — AB (ref 4.0–5.6)

## 2018-07-30 MED ORDER — INSULIN DEGLUDEC 200 UNIT/ML ~~LOC~~ SOPN: 100.0000 [IU] | PEN_INJECTOR | Freq: Every day | SUBCUTANEOUS | 11 refills | Status: DC

## 2018-07-30 NOTE — Progress Notes (Signed)
Subjective:    Patient ID: [name], female    DOB: 06-05-1971, 47 y.o.   MRN: [id number]  HPI Pt returns for f/u of diabetes mellitus: DM type: Insulin-requiring type 2 Dx'ed: 8413 Complications: renal insuff and painful polyneuropathy.  Therapy: insulin since 2012 GDM: never DKA: never.  Severe hypoglycemia: never.  Pancreatitis: never.   Other: she declines weight loss surgery; edema precludes pioglitizone rx; she did not tolerate metformin (diarrhea).   Interval history: pt says she misses the insulin 2-3 times per week.  She says she can afford the insulin, but on some days, just forgets to take.  No recent steroids.   Past Medical History:  Diagnosis Date  . Arthritis   . Asthma   . Chronic back pain   . Chronic chest pain   . Depression   . DM (diabetes mellitus) ([place])    INSULIN DEPENDENT  . GERD (gastroesophageal reflux disease)   . Headache(784.0)   . Hypertension   . Hypokalemia   . Respiratory disease 05/2017  . Tracheostomy in place [facility]) 02/2017    Past Surgical History:  Procedure Laterality Date  . APPENDECTOMY    . CESAREAN SECTION     x 3  . CHOLECYSTECTOMY N/A 03/02/2014   Procedure: LAPAROSCOPIC CHOLECYSTECTOMY;  Surgeon: Joyice Faster. Cornett, MD;  Location: [facility];  Service: General;  Laterality: N/A;  . HERNIA REPAIR    . PANENDOSCOPY N/A 03/04/2017   Procedure: PANENDOSCOPY WITH POSSIBLE FOREIGN BODY REMOVAL;  Surgeon: Jodi Marble, MD;  Location: [facility];  Service: ENT;  Laterality: N/A;  . ROTATOR CUFF REPAIR    . TRACHEOSTOMY  02/2017  . VESICOVAGINAL FISTULA CLOSURE W/ TAH  2009    Social History   Socioeconomic History  . Marital status: Married    Spouse name: Not on file  . Number of children: Not on file  . Years of education: Not on file  . Highest education level: Not on file  Occupational History  . Not on file  Social Needs  . Financial resource strain: Not on file  . Food insecurity:    Worry: Not on  file    Inability: Not on file  . Transportation needs:    Medical: Not on file    Non-medical: Not on file  Tobacco Use  . Smoking status: Former Smoker    Packs/day: 0.25    Years: 22.00    Pack years: 5.50    Types: Cigarettes    Last attempt to quit: 01/20/2017    Years since quitting: 1.5  . Smokeless tobacco: Never Used  Substance and Sexual Activity  . Alcohol use: No    Alcohol/week: 1.0 - 2.0 standard drinks    Types: 1 - 2 Glasses of wine per week    Comment: socially  . Drug use: No  . Sexual activity: Yes    Partners: Male    Birth control/protection: None  Lifestyle  . Physical activity:    Days per week: Not on file    Minutes per session: Not on file  . Stress: Not on file  Relationships  . Social connections:    Talks on phone: Not on file    Gets together: Not on file    Attends religious service: Not on file    Active member of club or organization: Not on file    Attends meetings of clubs or organizations: Not on file    Relationship status: Not on file  . Intimate  partner violence:    Fear of current or ex partner: Not on file    Emotionally abused: Not on file    Physically abused: Not on file    Forced sexual activity: Not on file  Other Topics Concern  . Not on file  Social History Narrative  . Not on file    Current Outpatient Medications on File Prior to Visit  Medication Sig Dispense Refill  . ACCU-CHEK SOFTCLIX LANCETS lancets Use as instructed to check blood sugar once daily. E11.9 100 each 12  . acetaminophen-codeine (TYLENOL #3) 300-30 MG tablet Take 1 tablet by mouth at bedtime as needed for moderate pain. 30 tablet 1  . albuterol (PROAIR HFA) 108 (90 Base) MCG/ACT inhaler Inhale 2 puffs into the lungs every 6 (six) hours as needed for wheezing or shortness of breath. 1 Inhaler 3  . albuterol (PROVENTIL) (2.5 MG/3ML) 0.083% nebulizer solution Take 3 mLs (2.5 mg total) by nebulization every 6 (six) hours as needed for wheezing or  shortness of breath. 75 mL 3  . amLODipine (NORVASC) 10 MG tablet Take 1 tablet (10 mg total) by mouth daily. 90 tablet 1  . Artificial Tear Ointment (ARTIFICIAL TEARS) ointment Place 1 application into both eyes at bedtime.     Marland Kitchen atorvastatin (LIPITOR) 20 MG tablet Take 1 tablet (20 mg total) by mouth daily. 90 tablet 1  . Blood Glucose Monitoring Suppl (ACCU-CHEK AVIVA PLUS) w/Device KIT Used to check blood sugars once daily. E11.9 1 kit 0  . budesonide-formoterol (SYMBICORT) 160-4.5 MCG/ACT inhaler Inhale 2 puffs into the lungs 2 (two) times daily. 3 Inhaler 1  . buPROPion (WELLBUTRIN SR) 150 MG 12 hr tablet Take 1 tablet (150 mg total) by mouth 2 (two) times daily. 60 tablet 3  . carvedilol (COREG) 12.5 MG tablet Take 2 tablets (25 mg total) by mouth 2 (two) times daily with a meal. 180 tablet 0  . furosemide (LASIX) 20 MG tablet Take 1 tablet (20 mg total) by mouth daily. 30 tablet 2  . gabapentin (NEURONTIN) 300 MG capsule Take 2 capsules (600 mg total) by mouth 2 (two) times daily. 360 capsule 1  . glucose blood (ACCU-CHEK AVIVA) test strip Use as directed to check blood sugars once daily. E11.9 100 each 12  . lisinopril (PRINIVIL,ZESTRIL) 5 MG tablet Take 1 tablet (5 mg total) by mouth daily. 30 tablet 3  . Melatonin 3 MG TABS Take 3 mg by mouth at bedtime as needed (for sleep).     . metroNIDAZOLE (FLAGYL) 500 MG tablet Take 1 tablet (500 mg total) by mouth 2 (two) times daily. 14 tablet 0  . montelukast (SINGULAIR) 10 MG tablet Take 1 tablet (10 mg total) by mouth at bedtime. 90 tablet 1  . sertraline (ZOLOFT) 100 MG tablet Place 1 tablet (100 mg total) into feeding tube daily. 30 tablet 5  . sodium chloride HYPERTONIC 3 % nebulizer solution Take 4 mLs by nebulization 2 (two) times daily. 750 mL 0  . SUMAtriptan (IMITREX) 100 MG tablet At the onset of a migraine; may repeat in 2 hours. Max daily dose 228m 20 tablet 1  . tiZANidine (ZANAFLEX) 4 MG tablet Take 1 tablet (4 mg total) by  mouth every 8 (eight) hours as needed for muscle spasms. 90 tablet 3  . topiramate (TOPAMAX) 100 MG tablet Take 1 tablet (100 mg total) by mouth 2 (two) times daily. 180 tablet 1  . [DISCONTINUED] fluticasone-salmeterol (ADVAIR HFA) 230-21 MCG/ACT inhaler Inhale 2 puffs into  the lungs 2 (two) times daily. 1 Inhaler 3   No current facility-administered medications on file prior to visit.     Allergies  Allergen Reactions  . Reglan [Metoclopramide] Other (See Comments)    Panic attack    Family History  Problem Relation Age of Onset  . Heart attack Mother   . Stroke Mother   . Diabetes Mother   . Hypertension Mother   . Arthritis Mother   . Stroke Father   . Hypertension Sister   . Diabetes Sister   . Seizures Brother   . Diabetes Brother     BP (!) 142/70 (BP Location: Left Arm, Patient Position: Sitting, Cuff Size: Large)   Pulse (!) 112   Ht 5' 1.5" (1.562 m)   Wt 246 lb (111.6 kg)   SpO2 96%   BMI 45.73 kg/m    Review of Systems She denies hypoglycemia.      Objective:   Physical Exam VITAL SIGNS:  See vs page GENERAL: no distress.  Has tracheostomy.  Pulses: foot pulses are intact bilaterally.   MSK: no deformity of the feet or ankles.  CV: 1+ bilat bilat edema of the legs.  Skin:  no ulcer on the feet or ankles.  normal color and temp on the feet and ankles Neuro: sensation is intact to touch on the feet and ankles, but decreased from normal Ext: There is bilateral onychomycosis of the toenails.    Lab Results  Component Value Date   HGBA1C 12.5 (A) 07/30/2018   Lab Results  Component Value Date   CREATININE 1.20 (H) 06/15/2018   BUN 21 06/15/2018   NA 135 06/15/2018   K 4.3 06/15/2018   CL 98 06/15/2018   CO2 18 (L) 06/15/2018       Assessment & Plan:  Insulin-requiring type 2 DM, with polyneuropathy: very poor glycemic control Noncompliance with cbg recording and insulin.  we discussed.  She'll change to tresiba Renal insuff: she may need to  eat a snack at HS, to avoid fasting hypoglycemia  Patient Instructions  Please change the basaglar to "Tresiba," 100 units each morning please come back for a follow-up appointment in 1 month.   check your blood sugar twice a day.  vary the time of day when you check, between before the 3 meals, and at bedtime.  also check if you have symptoms of your blood sugar being too high or too low.  please keep a record of the readings and bring it to your next appointment here (or you can bring the meter itself).  You can write it on any piece of paper.  please call us sooner if your blood sugar goes below 70, or if you have a lot of readings over 200.

## 2018-07-30 NOTE — Patient Instructions (Addendum)
Please change the basaglar to "Tresiba," 100 units each morning please come back for a follow-up appointment in 1 month.   check your blood sugar twice a day.  vary the time of day when you check, between before the 3 meals, and at bedtime.  also check if you have symptoms of your blood sugar being too high or too low.  please keep a record of the readings and bring it to your next appointment here (or you can bring the meter itself).  You can write it on any piece of paper.  please call us sooner if your blood sugar goes below 70, or if you have a lot of readings over 200.

## 2018-08-02 ENCOUNTER — Telehealth: Payer: Self-pay | Admitting: Family Medicine

## 2018-08-02 DIAGNOSIS — J4540 Moderate persistent asthma, uncomplicated: Secondary | ICD-10-CM | POA: Diagnosis not present

## 2018-08-02 DIAGNOSIS — G43119 Migraine with aura, intractable, without status migrainosus: Secondary | ICD-10-CM | POA: Diagnosis not present

## 2018-08-02 DIAGNOSIS — I10 Essential (primary) hypertension: Secondary | ICD-10-CM | POA: Diagnosis not present

## 2018-08-02 DIAGNOSIS — M1991 Primary osteoarthritis, unspecified site: Secondary | ICD-10-CM | POA: Diagnosis not present

## 2018-08-02 DIAGNOSIS — E1142 Type 2 diabetes mellitus with diabetic polyneuropathy: Secondary | ICD-10-CM | POA: Diagnosis not present

## 2018-08-02 DIAGNOSIS — F329 Major depressive disorder, single episode, unspecified: Secondary | ICD-10-CM | POA: Diagnosis not present

## 2018-08-02 NOTE — Telephone Encounter (Signed)
Will route to PCP for review. 

## 2018-08-02 NOTE — Telephone Encounter (Signed)
[name] called again to request a passy muir valve order faxed to [facility] [phone]

## 2018-08-02 NOTE — Telephone Encounter (Signed)
Patient was called and informed of medication being added and sent to [facility].

## 2018-08-02 NOTE — Telephone Encounter (Signed)
I had previously sent a reply to this message.  Her tracheostomy supplies and orders should come from her ENT.  The patient is able to provide that information to [facility]. Thanks.

## 2018-08-02 NOTE — Telephone Encounter (Signed)
[facility] was called and given verbal orders for speech therapy.

## 2018-08-02 NOTE — Telephone Encounter (Signed)
Speech therapist Marjory Lies called to inform patients vitals BP-156/100 with a reported headache and Nausea  Pulse 64 Resp-18 Please follow up with patient and she would also like verbal orders for speech therapy: once a week for 2 weeks   Marjory Lies- [phone] p

## 2018-08-03 NOTE — Telephone Encounter (Signed)
[name] was called and given verbal orders.

## 2018-08-10 ENCOUNTER — Other Ambulatory Visit: Payer: Self-pay

## 2018-08-10 ENCOUNTER — Ambulatory Visit: Payer: Medicare Other | Attending: Orthopedic Surgery

## 2018-08-10 DIAGNOSIS — G43119 Migraine with aura, intractable, without status migrainosus: Secondary | ICD-10-CM | POA: Diagnosis not present

## 2018-08-10 DIAGNOSIS — E1142 Type 2 diabetes mellitus with diabetic polyneuropathy: Secondary | ICD-10-CM | POA: Diagnosis not present

## 2018-08-10 DIAGNOSIS — F329 Major depressive disorder, single episode, unspecified: Secondary | ICD-10-CM | POA: Diagnosis not present

## 2018-08-10 DIAGNOSIS — R293 Abnormal posture: Secondary | ICD-10-CM | POA: Insufficient documentation

## 2018-08-10 DIAGNOSIS — R252 Cramp and spasm: Secondary | ICD-10-CM | POA: Insufficient documentation

## 2018-08-10 DIAGNOSIS — M1991 Primary osteoarthritis, unspecified site: Secondary | ICD-10-CM | POA: Diagnosis not present

## 2018-08-10 DIAGNOSIS — J4540 Moderate persistent asthma, uncomplicated: Secondary | ICD-10-CM | POA: Diagnosis not present

## 2018-08-10 DIAGNOSIS — M6281 Muscle weakness (generalized): Secondary | ICD-10-CM | POA: Insufficient documentation

## 2018-08-10 DIAGNOSIS — M25511 Pain in right shoulder: Secondary | ICD-10-CM | POA: Insufficient documentation

## 2018-08-10 DIAGNOSIS — I10 Essential (primary) hypertension: Secondary | ICD-10-CM | POA: Diagnosis not present

## 2018-08-10 NOTE — Therapy (Addendum)
[place], [place], [address] Phone: [phone]   Fax:  [phone]  Physical Therapy Evaluation/Discharge  Patient Details  Name: [name] MRN: [id number] Date of Birth: 03-Jul-1971 Referring Provider (PT): Mittie Bodo , MD   Encounter Date: 08/10/2018  PT End of Session - 08/10/18 1422    Visit Number  1    Number of Visits  12    Date for PT Re-Evaluation  09/24/18    Authorization Type  MCR    PT Start Time  0217    PT Stop Time  0300    PT Time Calculation (min)  43 min    Activity Tolerance  Patient limited by pain    Behavior During Therapy  [facility] for tasks assessed/performed       Past Medical History:  Diagnosis Date  . Arthritis   . Asthma   . Chronic back pain   . Chronic chest pain   . Depression   . DM (diabetes mellitus) ([place])    INSULIN DEPENDENT  . GERD (gastroesophageal reflux disease)   . Headache(784.0)   . Hypertension   . Hypokalemia   . Respiratory disease 05/2017  . Tracheostomy in place [facility]) 02/2017    Past Surgical History:  Procedure Laterality Date  . APPENDECTOMY    . CESAREAN SECTION     x 3  . CHOLECYSTECTOMY N/A 03/02/2014   Procedure: LAPAROSCOPIC CHOLECYSTECTOMY;  Surgeon: Joyice Faster. Cornett, MD;  Location: [place];  Service: General;  Laterality: N/A;  . HERNIA REPAIR    . PANENDOSCOPY N/A 03/04/2017   Procedure: PANENDOSCOPY WITH POSSIBLE FOREIGN BODY REMOVAL;  Surgeon: Jodi Marble, MD;  Location: [place];  Service: ENT;  Laterality: N/A;  . ROTATOR CUFF REPAIR    . TRACHEOSTOMY  02/2017  . VESICOVAGINAL FISTULA CLOSURE W/ TAH  2009    There were no vitals filed for this visit.   Subjective Assessment - 08/10/18 1417    Subjective  She reports  RT shoulder pain withoiut injury with Partial RTC. She recived injection without benefit. Also back injection without benefit.        Pertinent History  Lt shoulder RTC repair.   trach due to  damage after lung issues after put in a coma.     Limitations  Lifting;Sitting;House hold activities   reaching  lye on RT side.    Diagnostic tests  MRI part tear RTC    Patient Stated Goals  She wants to  have less pain    Currently in Pain?  Yes    Pain Score  7     Pain Location  Shoulder    Pain Orientation  Right;Anterior;Posterior;Lateral    Pain Descriptors / Indicators  Sharp;Tightness    Pain Type  Chronic pain    Pain Radiating Towards  outside of RT arm to hand    Pain Onset  More than a month ago    Pain Frequency  Constant    Aggravating Factors   using arm.     Pain Relieving Factors  heat , OTC meds         [facility] PT Assessment - 08/10/18 0001      Assessment   Medical Diagnosis  RT shoulder pain    Referring Provider (PT)  Mittie Bodo , MD    Onset Date/Surgical Date  --   2 years   Hand Dominance  Right    Next MD Visit  1 months  Prior Therapy  No      Precautions   Precautions  None      Restrictions   Weight Bearing Restrictions  No      Balance Screen   Has the patient fallen in the past 6 months  No      ROM / Strength   AROM / PROM / Strength  AROM;PROM;Strength      AROM   AROM Assessment Site  Shoulder    Right/Left Shoulder  Right;Left    Right Shoulder Extension  30 Degrees    Right Shoulder Flexion  95 Degrees    Right Shoulder ABduction  90 Degrees   in scaption   Right Shoulder Internal Rotation  5 Degrees    Right Shoulder External Rotation  45 Degrees    Left Shoulder Extension  53 Degrees    Left Shoulder Flexion  135 Degrees    Left Shoulder ABduction  95 Degrees    Left Shoulder Internal Rotation  70 Degrees    Left Shoulder External Rotation  80 Degrees      Strength   Overall Strength Comments  able to give good reistance all planes but limited due to pain                Objective measurements completed on examination: See above findings.              PT Education - 08/10/18 1421    Education  Details  POC, scapula ROM 2-5 reps 3-5x/day , pain mechanisms.     Person(s) Educated  Patient    Methods  Explanation;Demonstration;Verbal cues;Handout;Tactile cues    Comprehension  Verbalized understanding;Returned demonstration       PT Short Term Goals - 08/10/18 1507      PT SHORT TERM GOAL #1   Title  She will be indpendent with initial HEP    Time  2    Period  Weeks    Status  New      PT SHORT TERM GOAL #2   Title  She will report general pain decr 10-20%    Time  3    Period  Weeks    Status  New      PT SHORT TERM GOAL #3   Title  She will have active abduction to 110 degrees .     Time  3    Period  Weeks    Status  New      PT SHORT TERM GOAL #4   Title  She will have 70 degrees activ e ROM     Time  3    Period  Weeks    Status  New        PT Long Term Goals - 08/10/18 1509      PT LONG TERM GOAL #1   Title  She will be independent with all HEP issued    Time  6    Period  Weeks    Status  New      PT LONG TERM GOAL #2   Title  She will report able to perform light home tasks with 3-4 max pain    Time  6    Period  Weeks    Status  New      PT LONG TERM GOAL #3   Title  she will be able to reach upper cabinets with  3-4 max pain.     Time  6    Period  Weeks  Status  New      PT LONG TERM GOAL #4   Title  She will report 50% less pain with lying on RT side.     Time  6    Period  Weeks    Status  New      PT LONG TERM GOAL #5   Title  FOTO score decr to 45% limited or more to demo percived improved function    Time  6    Period  Weeks    Status  New             Plan - 08/10/18 1444    Clinical Impression Statement  [name] presents with chronic RT shoulder pain severely limiting functional use. Her active motion is significnatly limited but passive though not full is better. she tends to posture shoulder forward.   Progressive exercise starting gently with skilled PT should make improvement with use of RT arm     History and Personal Factors relevant to plan of care:  LT RTC repair . Trach from lung issues, chronic back pain migraines, obesity    Clinical Presentation  Unstable    Clinical Presentation due to:  chronic RT shoulder pain    Clinical Decision Making  Moderate    Rehab Potential  Good    PT Frequency  2x / week    PT Duration  6 weeks    PT Treatment/Interventions  Patient/family education;Therapeutic exercise;Ultrasound;Moist Heat;Electrical Stimulation;Iontophoresis 36m/ml Dexamethasone;Manual techniques;Passive range of motion;Taping    PT Next Visit Plan  [place] HEP  manual and modalities for pain and ROM    PT Home Exercise Plan  scapula ROM,    Consulted and Agree with Plan of Care  Patient       Patient will benefit from skilled therapeutic intervention in order to improve the following deficits and impairments:  Pain, Postural dysfunction, Increased muscle spasms, Decreased activity tolerance, Impaired UE functional use, Decreased strength, Decreased range of motion  Visit Diagnosis: Right shoulder pain, unspecified chronicity  Abnormal posture  Cramp and spasm  Muscle weakness (generalized)     Problem List Patient Active Problem List   Diagnosis Date Noted  . Rotator cuff arthropathy, right 06/15/2018  . AKI (acute kidney injury) ([place] 03/06/2018  . Carpal tunnel syndrome 01/18/2018  . Acute respiratory distress 06/02/2017  . Elevated lactic acid level 06/02/2017  . Sepsis ([place] 05/07/2017  . Productive cough 05/06/2017  . Normocytic anemia 05/06/2017  . Hypokalemia 05/06/2017  . Tracheostomy status ([place] 04/28/2017  . Tracheal stenosis 03/04/2017  . Acute blood loss anemia   . Generalized anxiety disorder   . Hypoalbuminemia due to protein-calorie malnutrition ([place]   . Severe asthma with acute exacerbation   . Type 2 diabetes mellitus with diabetic polyneuropathy, without long-term current use of insulin ([place]   . Morbid obesity ([place]   . Chronic pain  syndrome   . Acute renal failure ([place]   . Gastritis and gastroduodenitis 02/22/2016  . Depression 02/22/2016  . Migraine 01/30/2015  . Bulging lumbar disc 09/05/2013  . Essential hypertension 09/05/2013  . Acute bronchitis 05/01/2013  . Asthma exacerbation 04/27/2013  . Allergic rhinitis, seasonal 08/11/2012  . Chronic cough 08/11/2012  . Sleep apnea, obstructive 12/04/2011    CDarrel Hoover PT 08/10/2018, 3:14 PM  [facility] [address]Phone: [phone]  Fax:  [phone] Name: RElvenia GoddenHutchinson-[name] MRN: [id number] of Birth: 101/04/1971  PHYSICAL THERAPY DISCHARGE SUMMARY  Visits from Start of Care: 1  Current functional level related to goals / functional outcomes: Unknown   Remaining deficits: Unknown   Education / Equipment: NA  Plan:                                                    Patient goals were not met. Patient is being discharged due to not returning since the last visit.  ?????    Pearson Forster PT  02/28/19

## 2018-08-24 ENCOUNTER — Encounter: Payer: Self-pay | Admitting: Physical Medicine & Rehabilitation

## 2018-08-24 ENCOUNTER — Ambulatory Visit: Payer: Medicare Other | Admitting: Physical Medicine & Rehabilitation

## 2018-08-24 DIAGNOSIS — M25511 Pain in right shoulder: Secondary | ICD-10-CM | POA: Insufficient documentation

## 2018-08-25 ENCOUNTER — Telehealth: Payer: Self-pay | Admitting: Family Medicine

## 2018-08-25 ENCOUNTER — Ambulatory Visit: Payer: Medicare Other | Admitting: Physical Therapy

## 2018-08-25 NOTE — Telephone Encounter (Signed)
1) Medication(s) Requested (by name): -acetaminophen-codeine (TYLENOL #3) 300-30 MG tablet  -Cough syrup  2) Pharmacy of Choice: -[facility] ([facility]), [place] - [address] 3) Special Requests:   Approved medications will be sent to the pharmacy, we will reach out if there is an issue.  Requests made after 3pm may not be addressed until the following business day!  If a patient is unsure of the name of the medication(s) please note and ask patient to call back when they are able to provide all info, do not send to responsible party until all information is available!

## 2018-08-26 NOTE — Telephone Encounter (Signed)
She is currently seeing a back specialist and will need to obtain pain medications from them.  Could you please obtain the name of the cough medication she is needing a refill on?  Thank you.

## 2018-09-01 ENCOUNTER — Ambulatory Visit (INDEPENDENT_AMBULATORY_CARE_PROVIDER_SITE_OTHER): Payer: Medicare Other | Admitting: Endocrinology

## 2018-09-01 ENCOUNTER — Encounter: Payer: Self-pay | Admitting: Endocrinology

## 2018-09-01 VITALS — BP 138/92 | HR 116 | Ht 62.0 in | Wt 248.4 lb

## 2018-09-01 DIAGNOSIS — E1142 Type 2 diabetes mellitus with diabetic polyneuropathy: Secondary | ICD-10-CM | POA: Diagnosis not present

## 2018-09-01 MED ORDER — INSULIN DEGLUDEC 200 UNIT/ML ~~LOC~~ SOPN: 130.0000 [IU] | PEN_INJECTOR | Freq: Every day | SUBCUTANEOUS | 11 refills | Status: DC

## 2018-09-01 NOTE — Patient Instructions (Addendum)
Your blood pressure is high today.  Please see your primary care provider soon, to have it rechecked Please increase the Tresiba to 130 units each morning.  If you miss this, you can take all of it as soon as you can.    please come back for a follow-up appointment in 1 month.   check your blood sugar twice a day.  vary the time of day when you check, between before the 3 meals, and at bedtime.  also check if you have symptoms of your blood sugar being too high or too low.  please keep a record of the readings and bring it to your next appointment here (or you can bring the meter itself).  You can write it on any piece of paper.  please call us sooner if your blood sugar goes below 70, or if you have a lot of readings over 200.

## 2018-09-01 NOTE — Progress Notes (Signed)
Subjective:    Patient ID: [name], female    DOB: 02-07-1971, 47 y.o.   MRN: [id number]  HPI Pt returns for f/u of diabetes mellitus: DM type: Insulin-requiring type 2 Dx'ed: 5465 Complications: renal insuff and painful polyneuropathy.  Therapy: insulin since 2012 GDM: never DKA: never.  Severe hypoglycemia: never.  Pancreatitis: never.   Other: she declines weight loss surgery; edema precludes pioglitizone rx; she did not tolerate metformin (diarrhea); she is not a candidate for multiple daily injections, due to missing insulin doses.    Interval history: pt says she seldom misses the tresiba  No recent steroids.  no cbg record, but states cbg's are 200-300's.      Past Medical History:  Diagnosis Date  . Arthritis   . Asthma   . Chronic back pain   . Chronic chest pain   . Depression   . DM (diabetes mellitus) ([place])    INSULIN DEPENDENT  . GERD (gastroesophageal reflux disease)   . Headache(784.0)   . Hypertension   . Hypokalemia   . Respiratory disease 05/2017  . Tracheostomy in place [facility]) 02/2017    Past Surgical History:  Procedure Laterality Date  . APPENDECTOMY    . CESAREAN SECTION     x 3  . CHOLECYSTECTOMY N/A 03/02/2014   Procedure: LAPAROSCOPIC CHOLECYSTECTOMY;  Surgeon: Joyice Faster. Cornett, MD;  Location: [place];  Service: General;  Laterality: N/A;  . HERNIA REPAIR    . PANENDOSCOPY N/A 03/04/2017   Procedure: PANENDOSCOPY WITH POSSIBLE FOREIGN BODY REMOVAL;  Surgeon: Jodi Marble, MD;  Location: [place];  Service: ENT;  Laterality: N/A;  . ROTATOR CUFF REPAIR    . TRACHEOSTOMY  02/2017  . VESICOVAGINAL FISTULA CLOSURE W/ TAH  2009    Social History   Socioeconomic History  . Marital status: Married    Spouse name: Not on file  . Number of children: Not on file  . Years of education: Not on file  . Highest education level: Not on file  Occupational History  . Not on file  Social Needs  . Financial resource strain: Not on  file  . Food insecurity:    Worry: Not on file    Inability: Not on file  . Transportation needs:    Medical: Not on file    Non-medical: Not on file  Tobacco Use  . Smoking status: Former Smoker    Packs/day: 0.25    Years: 22.00    Pack years: 5.50    Types: Cigarettes    Last attempt to quit: 01/20/2017    Years since quitting: 1.6  . Smokeless tobacco: Never Used  Substance and Sexual Activity  . Alcohol use: No    Alcohol/week: 1.0 - 2.0 standard drinks    Types: 1 - 2 Glasses of wine per week    Comment: socially  . Drug use: No  . Sexual activity: Yes    Partners: Male    Birth control/protection: None  Lifestyle  . Physical activity:    Days per week: Not on file    Minutes per session: Not on file  . Stress: Not on file  Relationships  . Social connections:    Talks on phone: Not on file    Gets together: Not on file    Attends religious service: Not on file    Active member of club or organization: Not on file    Attends meetings of clubs or organizations: Not on file  Relationship status: Not on file  . Intimate partner violence:    Fear of current or ex partner: Not on file    Emotionally abused: Not on file    Physically abused: Not on file    Forced sexual activity: Not on file  Other Topics Concern  . Not on file  Social History Narrative  . Not on file    Current Outpatient Medications on File Prior to Visit  Medication Sig Dispense Refill  . ACCU-CHEK SOFTCLIX LANCETS lancets Use as instructed to check blood sugar once daily. E11.9 100 each 12  . acetaminophen-codeine (TYLENOL #3) 300-30 MG tablet Take 1 tablet by mouth at bedtime as needed for moderate pain. 30 tablet 1  . albuterol (PROAIR HFA) 108 (90 Base) MCG/ACT inhaler Inhale 2 puffs into the lungs every 6 (six) hours as needed for wheezing or shortness of breath. 1 Inhaler 3  . albuterol (PROVENTIL) (2.5 MG/3ML) 0.083% nebulizer solution Take 3 mLs (2.5 mg total) by nebulization every 6  (six) hours as needed for wheezing or shortness of breath. 75 mL 3  . amLODipine (NORVASC) 10 MG tablet Take 1 tablet (10 mg total) by mouth daily. 90 tablet 1  . Artificial Tear Ointment (ARTIFICIAL TEARS) ointment Place 1 application into both eyes at bedtime.     Marland Kitchen atorvastatin (LIPITOR) 20 MG tablet Take 1 tablet (20 mg total) by mouth daily. 90 tablet 1  . Blood Glucose Monitoring Suppl (ACCU-CHEK AVIVA PLUS) w/Device KIT Used to check blood sugars once daily. E11.9 1 kit 0  . budesonide-formoterol (SYMBICORT) 160-4.5 MCG/ACT inhaler Inhale 2 puffs into the lungs 2 (two) times daily. 3 Inhaler 1  . buPROPion (WELLBUTRIN SR) 150 MG 12 hr tablet Take 1 tablet (150 mg total) by mouth 2 (two) times daily. 60 tablet 3  . carvedilol (COREG) 12.5 MG tablet Take 2 tablets (25 mg total) by mouth 2 (two) times daily with a meal. 180 tablet 0  . furosemide (LASIX) 20 MG tablet Take 1 tablet (20 mg total) by mouth daily. 30 tablet 2  . gabapentin (NEURONTIN) 300 MG capsule Take 2 capsules (600 mg total) by mouth 2 (two) times daily. 360 capsule 1  . glucose blood (ACCU-CHEK AVIVA) test strip Use as directed to check blood sugars once daily. E11.9 100 each 12  . lisinopril (PRINIVIL,ZESTRIL) 5 MG tablet Take 1 tablet (5 mg total) by mouth daily. 30 tablet 3  . Melatonin 3 MG TABS Take 3 mg by mouth at bedtime as needed (for sleep).     . metroNIDAZOLE (FLAGYL) 500 MG tablet Take 1 tablet (500 mg total) by mouth 2 (two) times daily. 14 tablet 0  . montelukast (SINGULAIR) 10 MG tablet Take 1 tablet (10 mg total) by mouth at bedtime. 90 tablet 1  . sertraline (ZOLOFT) 100 MG tablet Place 1 tablet (100 mg total) into feeding tube daily. 30 tablet 5  . sodium chloride HYPERTONIC 3 % nebulizer solution Take 4 mLs by nebulization 2 (two) times daily. 750 mL 0  . SUMAtriptan (IMITREX) 100 MG tablet At the onset of a migraine; may repeat in 2 hours. Max daily dose 240m 20 tablet 1  . tiZANidine (ZANAFLEX) 4 MG  tablet Take 1 tablet (4 mg total) by mouth every 8 (eight) hours as needed for muscle spasms. 90 tablet 3  . topiramate (TOPAMAX) 100 MG tablet Take 1 tablet (100 mg total) by mouth 2 (two) times daily. 180 tablet 1  . [DISCONTINUED] fluticasone-salmeterol (ADVAIR  HFA) 230-21 MCG/ACT inhaler Inhale 2 puffs into the lungs 2 (two) times daily. 1 Inhaler 3   No current facility-administered medications on file prior to visit.     Allergies  Allergen Reactions  . Reglan [Metoclopramide] Other (See Comments)    Panic attack    Family History  Problem Relation Age of Onset  . Heart attack Mother   . Stroke Mother   . Diabetes Mother   . Hypertension Mother   . Arthritis Mother   . Stroke Father   . Hypertension Sister   . Diabetes Sister   . Seizures Brother   . Diabetes Brother     BP (!) 138/92 (BP Location: Left Arm, Patient Position: Sitting, Cuff Size: Normal)   Pulse (!) 116   Ht 5' 2" (1.575 m)   Wt 248 lb 6.4 oz (112.7 kg)   SpO2 96%   BMI 45.43 kg/m    Review of Systems She denies hypoglycemia.      Objective:   Physical Exam VITAL SIGNS:  See vs page GENERAL: no distress Pulses: dorsalis pedis intact bilat.   MSK: no deformity of the feet CV: trace bilat leg edema Skin:  no ulcer on the feet.  normal color and temp on the feet. Neuro: sensation is intact to touch on the feet Ext: There is bilateral onychomycosis of the toenails  Lab Results  Component Value Date   HGBA1C 12.5 (A) 07/30/2018       Assessment & Plan:  HTN: is high today.  Insulin-requiring type 2 DM, with renal insuff: poor glycemic control.  Edema: this limits rx options.   Patient Instructions  Your blood pressure is high today.  Please see your primary care provider soon, to have it rechecked Please increase the Tresiba to 130 units each morning.  If you miss this, you can take all of it as soon as you can.    please come back for a follow-up appointment in 1 month.   check your  blood sugar twice a day.  vary the time of day when you check, between before the 3 meals, and at bedtime.  also check if you have symptoms of your blood sugar being too high or too low.  please keep a record of the readings and bring it to your next appointment here (or you can bring the meter itself).  You can write it on any piece of paper.  please call us sooner if your blood sugar goes below 70, or if you have a lot of readings over 200.

## 2018-09-02 ENCOUNTER — Emergency Department (HOSPITAL_COMMUNITY): Payer: No Typology Code available for payment source

## 2018-09-02 ENCOUNTER — Telehealth: Payer: Self-pay | Admitting: Physical Therapy

## 2018-09-02 ENCOUNTER — Encounter (HOSPITAL_COMMUNITY): Payer: Self-pay | Admitting: Emergency Medicine

## 2018-09-02 ENCOUNTER — Ambulatory Visit: Payer: Medicare Other | Attending: Orthopedic Surgery

## 2018-09-02 ENCOUNTER — Other Ambulatory Visit: Payer: Self-pay

## 2018-09-02 ENCOUNTER — Emergency Department (HOSPITAL_COMMUNITY)
Admission: EM | Admit: 2018-09-02 | Discharge: 2018-09-02 | Disposition: A | Discharge status: Home / Self Care | Payer: No Typology Code available for payment source | Attending: Emergency Medicine | Admitting: Emergency Medicine

## 2018-09-02 DIAGNOSIS — Y999 Unspecified external cause status: Secondary | ICD-10-CM | POA: Insufficient documentation

## 2018-09-02 DIAGNOSIS — J45909 Unspecified asthma, uncomplicated: Secondary | ICD-10-CM | POA: Diagnosis not present

## 2018-09-02 DIAGNOSIS — T07XXXA Unspecified multiple injuries, initial encounter: Secondary | ICD-10-CM

## 2018-09-02 DIAGNOSIS — E1142 Type 2 diabetes mellitus with diabetic polyneuropathy: Secondary | ICD-10-CM | POA: Diagnosis not present

## 2018-09-02 DIAGNOSIS — M79631 Pain in right forearm: Secondary | ICD-10-CM | POA: Diagnosis not present

## 2018-09-02 DIAGNOSIS — I10 Essential (primary) hypertension: Secondary | ICD-10-CM | POA: Diagnosis not present

## 2018-09-02 DIAGNOSIS — Z79899 Other long term (current) drug therapy: Secondary | ICD-10-CM | POA: Diagnosis not present

## 2018-09-02 DIAGNOSIS — M545 Low back pain: Secondary | ICD-10-CM | POA: Diagnosis not present

## 2018-09-02 DIAGNOSIS — S161XXA Strain of muscle, fascia and tendon at neck level, initial encounter: Secondary | ICD-10-CM | POA: Diagnosis not present

## 2018-09-02 DIAGNOSIS — S59901A Unspecified injury of right elbow, initial encounter: Secondary | ICD-10-CM | POA: Diagnosis not present

## 2018-09-02 DIAGNOSIS — S0990XA Unspecified injury of head, initial encounter: Secondary | ICD-10-CM | POA: Diagnosis not present

## 2018-09-02 DIAGNOSIS — Y92410 Unspecified street and highway as the place of occurrence of the external cause: Secondary | ICD-10-CM | POA: Insufficient documentation

## 2018-09-02 DIAGNOSIS — M542 Cervicalgia: Secondary | ICD-10-CM | POA: Diagnosis not present

## 2018-09-02 DIAGNOSIS — R51 Headache: Secondary | ICD-10-CM | POA: Insufficient documentation

## 2018-09-02 DIAGNOSIS — S199XXA Unspecified injury of neck, initial encounter: Secondary | ICD-10-CM | POA: Diagnosis not present

## 2018-09-02 DIAGNOSIS — S4991XA Unspecified injury of right shoulder and upper arm, initial encounter: Secondary | ICD-10-CM | POA: Diagnosis not present

## 2018-09-02 DIAGNOSIS — M25521 Pain in right elbow: Secondary | ICD-10-CM | POA: Diagnosis not present

## 2018-09-02 DIAGNOSIS — E1165 Type 2 diabetes mellitus with hyperglycemia: Secondary | ICD-10-CM | POA: Diagnosis not present

## 2018-09-02 DIAGNOSIS — M25519 Pain in unspecified shoulder: Secondary | ICD-10-CM | POA: Diagnosis not present

## 2018-09-02 DIAGNOSIS — T148XXA Other injury of unspecified body region, initial encounter: Secondary | ICD-10-CM | POA: Diagnosis not present

## 2018-09-02 DIAGNOSIS — S59911A Unspecified injury of right forearm, initial encounter: Secondary | ICD-10-CM | POA: Diagnosis not present

## 2018-09-02 DIAGNOSIS — M79621 Pain in right upper arm: Secondary | ICD-10-CM | POA: Diagnosis not present

## 2018-09-02 DIAGNOSIS — S3992XA Unspecified injury of lower back, initial encounter: Secondary | ICD-10-CM | POA: Diagnosis not present

## 2018-09-02 DIAGNOSIS — Z87891 Personal history of nicotine dependence: Secondary | ICD-10-CM | POA: Insufficient documentation

## 2018-09-02 DIAGNOSIS — Y9389 Activity, other specified: Secondary | ICD-10-CM | POA: Insufficient documentation

## 2018-09-02 DIAGNOSIS — R52 Pain, unspecified: Secondary | ICD-10-CM | POA: Diagnosis not present

## 2018-09-02 MED ORDER — OXYCODONE-ACETAMINOPHEN 5-325 MG PO TABS
2.0000 | ORAL_TABLET | Freq: Once | ORAL | Status: AC
  Administered 2018-09-02: 2 via ORAL
  Filled 2018-09-02: qty 2

## 2018-09-02 MED ORDER — OXYCODONE-ACETAMINOPHEN 5-325 MG PO TABS: 1.0000 | ORAL_TABLET | Freq: Four times a day (QID) | ORAL | 0 refills | Status: DC | PRN
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
## 2018-09-02 NOTE — ED Notes (Signed)
RT at bedside for trach suctioning.

## 2018-09-02 NOTE — ED Notes (Signed)
RT  Notified of need for trach suctioning
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
## 2018-09-08 ENCOUNTER — Telehealth: Payer: Self-pay | Admitting: Family Medicine

## 2018-09-08 NOTE — Telephone Encounter (Signed)
Pt called stating that she is having breathing troubles lately and chest pain, she wanted to be seen earlier and was offered a same day appointment but denied the appt due to no transportation, she was advised to go to the nearest  ER or Urgent care if the symptoms persist and verbalized understanding. Pt would like a call back from the nurse, please follow up.

## 2018-09-20 ENCOUNTER — Ambulatory Visit: Payer: Medicare Other | Admitting: Family Medicine

## 2018-09-20 ENCOUNTER — Telehealth: Payer: Self-pay | Admitting: Family Medicine
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
## 2018-09-24 MED ORDER — HYDROXYZINE HCL 25 MG PO TABS: 25.0000 mg | ORAL_TABLET | Freq: Three times a day (TID) | ORAL | 0 refills | Status: DC | PRN

## 2018-09-24 NOTE — Telephone Encounter (Signed)
Patient was called and informed of medication being sent to pharmacy. 

## 2018-09-24 NOTE — Telephone Encounter (Signed)
I have sent a rx for Hydroxyzine to her pharmacy

## 2018-09-27 ENCOUNTER — Encounter: Payer: Medicare Other | Attending: Physical Medicine & Rehabilitation

## 2018-09-27 ENCOUNTER — Ambulatory Visit (HOSPITAL_BASED_OUTPATIENT_CLINIC_OR_DEPARTMENT_OTHER): Payer: Medicare Other | Admitting: Physical Medicine & Rehabilitation

## 2018-09-27 ENCOUNTER — Ambulatory Visit: Payer: Medicare Other | Admitting: Physical Medicine & Rehabilitation

## 2018-09-27 VITALS — BP 137/84 | HR 99 | Ht 62.0 in | Wt 246.0 lb

## 2018-09-27 DIAGNOSIS — M47816 Spondylosis without myelopathy or radiculopathy, lumbar region: Secondary | ICD-10-CM

## 2018-09-27 DIAGNOSIS — M25511 Pain in right shoulder: Secondary | ICD-10-CM | POA: Diagnosis not present

## 2018-09-27 NOTE — Progress Notes (Signed)
  PROCEDURE RECORD [facility]   Name: [name] DOB:[birth year] MRN: [id number]  Date:09/27/2018  Physician: Alysia Penna, MD    Nurse/CMA: Betti Cruz  Allergies:  Allergies  Allergen Reactions  . Reglan [Metoclopramide] Other (See Comments)    Panic attack    Consent Signed: Yes.    Is patient diabetic? Yes.    CBG today? 10  Pregnant: No. LMP: No LMP recorded. Patient has had a hysterectomy. (age 65-53)  Anticoagulants: no Anti-inflammatory: no Antibiotics: no  Procedure: Right Medial Branch Block Position: Prone Start Time: 2:54pm   End Time: 3:02pm Fluoro Time: 37  RN/CMA Yavonne Kiss,CMA Shakeita Vandevander,CMA    Time 2:26pm 3:06pm    BP 137/84 139/87    Pulse 99 97    Respirations 16 16    O2 Sat 95 95    S/S 6 6    Pain Level 7/10 5/10     D/C home with husband, patient A & O X 3, D/C instructions reviewed, and sits independently.

## 2018-09-27 NOTE — Progress Notes (Signed)
Right lumbar L3, L4 medial branch blocks and L5 dorsal ramus injection under fluoroscopic guidance  Indication: Right Lumbar pain which is not relieved by medication management or other conservative care and interfering with self-care and mobility.  Informed consent was obtained after describing risks and benefits of the procedure with the patient, this includes bleeding, bruising, infection, paralysis and medication side effects. The patient wishes to proceed and has given written consent. The patient was placed in a prone position. The lumbar area was marked and prepped with Betadine. One ML of 1% lidocaine was injected into each of 3 areas into the skin and subcutaneous tissue. Then a 22-gauge 5in  spinal needle was inserted targeting the junction of the Right S1 superior articular process and sacral ala junction. Needle was advanced under fluoroscopic guidance. Bone contact was made.Isovue 200 was injected x0.5 mL demonstrating no intravascular uptake. Then a solution containing 2% MPF lidocaine was injected x0.5 mL. Then the Right L5 superior articular process in transverse process junction was targeted. Bone contact was made.Isovue 200 was injected x0.5 mL demonstrating no intravascular uptake. Then a solution containing 2% MPF lidocaine was injected x0.5 mL. Then the Right L4 superior articular process in transverse process junction was targeted. Bone contact was made. Isovue 200 was injected x0.5 mL demonstrating no intravascular uptake. Then a solution containing2% MPF lidocaine was injected x0.5 mL Patient tolerated procedure well. Post procedure instructions were given. Please refer to post procedure form. 

## 2018-09-27 NOTE — Patient Instructions (Signed)
Lumbar medial branch blocks were performed. This is to help diagnose the cause of the low back pain. It is important that you keep track of your pain for the first day or 2 after injection. This injection can give you temporary relief that lasts for hours or up to several months. There is no way to predict duration of pain relief.  Please try to compare your pain after injection to for the injection.  If this injection gives you  temporary relief there may be another longer-lasting procedure that may be beneficial call radiofrequency ablation 

## 2018-09-28 DIAGNOSIS — J386 Stenosis of larynx: Secondary | ICD-10-CM | POA: Diagnosis not present

## 2018-09-28 DIAGNOSIS — Z430 Encounter for attention to tracheostomy: Secondary | ICD-10-CM | POA: Diagnosis not present

## 2018-09-28 DIAGNOSIS — Z87891 Personal history of nicotine dependence: Secondary | ICD-10-CM | POA: Diagnosis not present

## 2018-09-30 ENCOUNTER — Ambulatory Visit: Payer: Medicare Other | Admitting: Neurology

## 2018-10-03 IMAGING — CT CT ANGIO CHEST
2 of 6 series · 19 of 36 positions shown · IV contrast (iopamidol)
Comparison: CT chest 06/02/2017, 03/04/2017 and 05/03/2015.

CLINICAL DATA: Chest pain for 4 days.

EXAM:
CT ANGIOGRAPHY CHEST WITH CONTRAST
TECHNIQUE: Multidetector CT imaging of the chest was performed using the
standard protocol during bolus administration of intravenous
contrast. Multiplanar CT image reconstructions and MIPs were
obtained to evaluate the vascular anatomy.
CONTRAST:  100 ml U1RTSO-YSR IOPAMIDOL (U1RTSO-YSR) INJECTION 76%

[Series 6: pe thins · axial · 0.71mm/px · z∈[+1107,+1325]mm · 18 of 344 slices shown]
[im 16/344  lung]
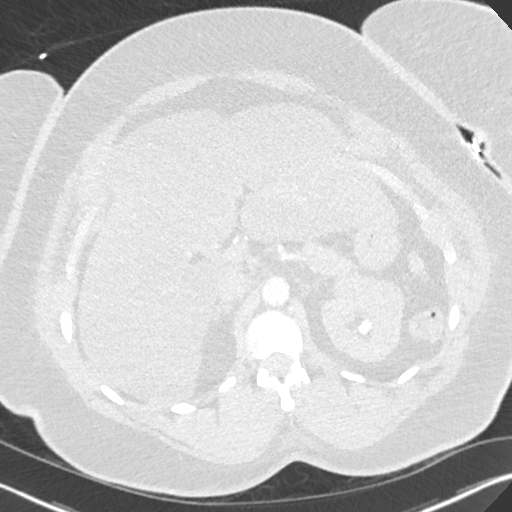
[im 32/344  mediastinal]
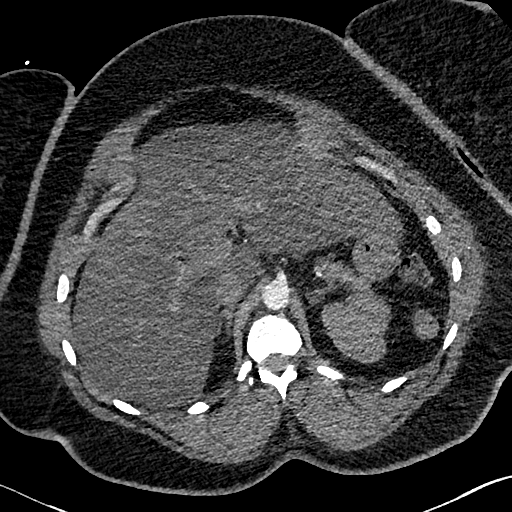
[im 47/344  lung]
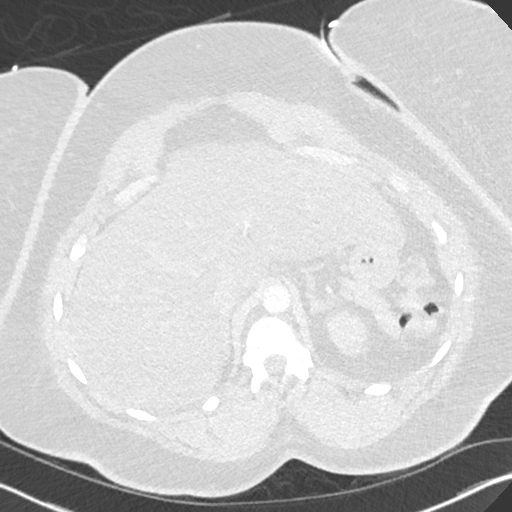
[im 78/344  mediastinal]
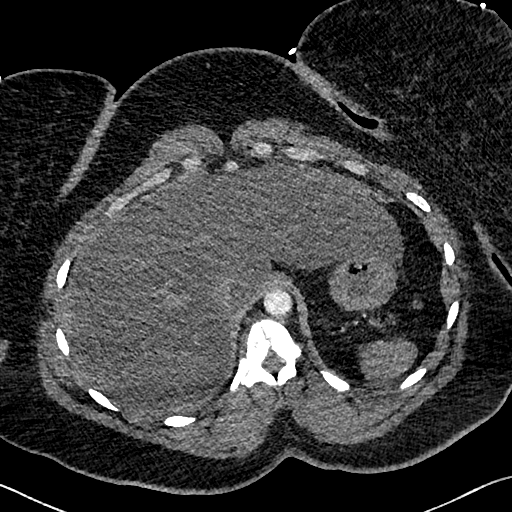
[im 94/344  lung]
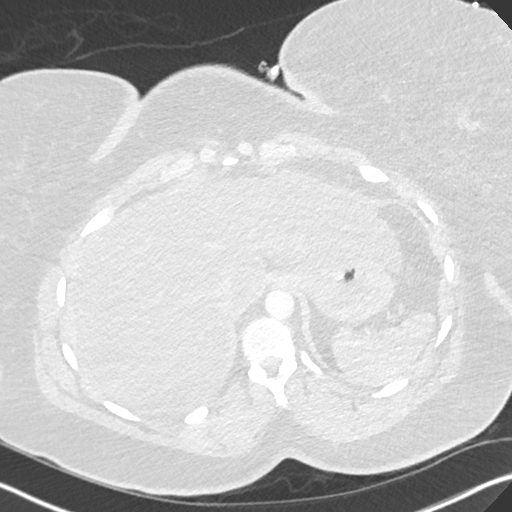
[im 110/344  mediastinal]
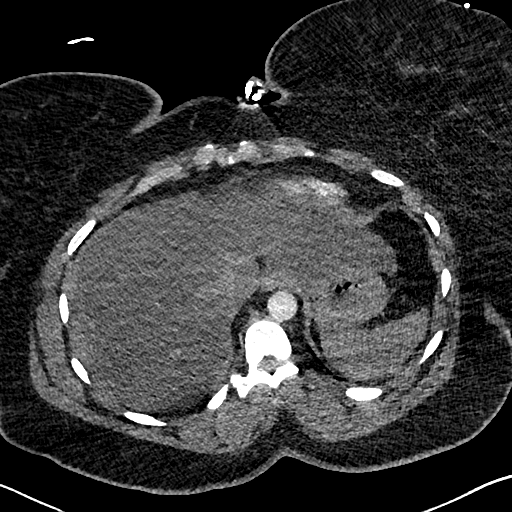
[im 125/344  lung]
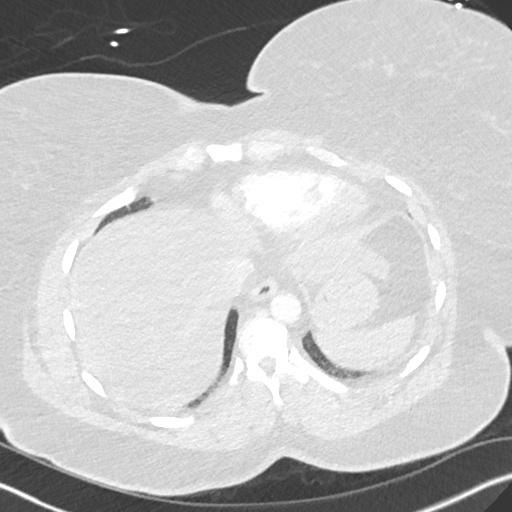
[im 141/344  mediastinal]
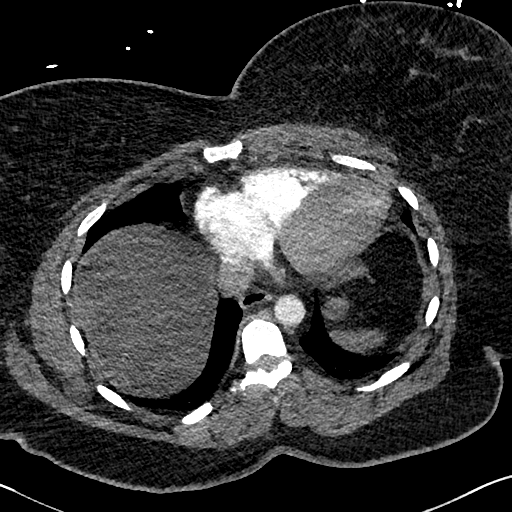
[im 156/344  lung]
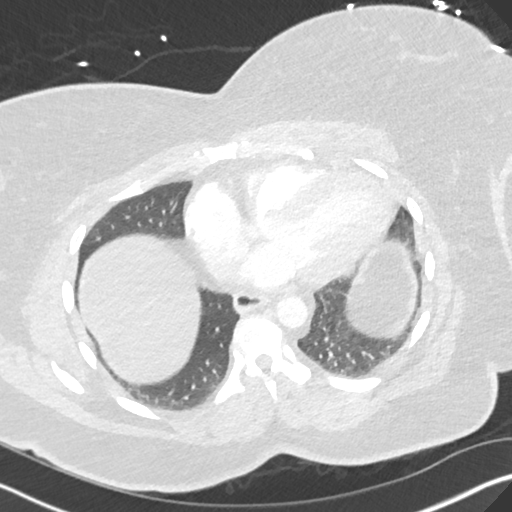
[im 188/344  mediastinal]
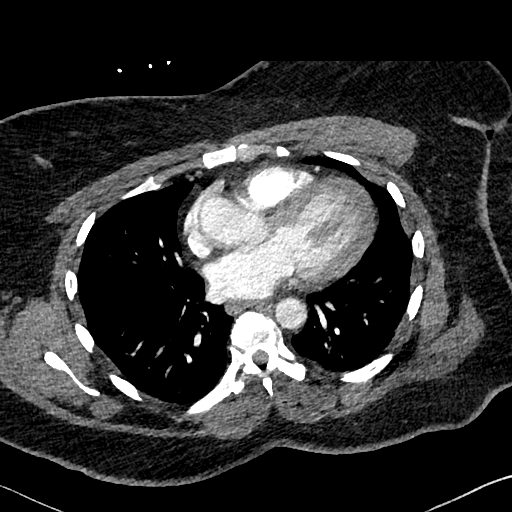
[im 203/344  lung]
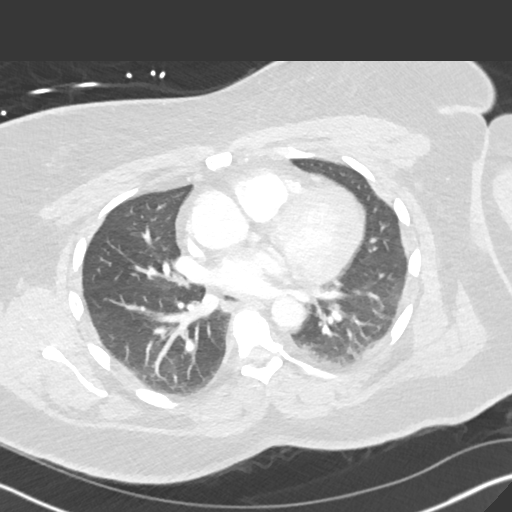
[im 219/344  mediastinal]
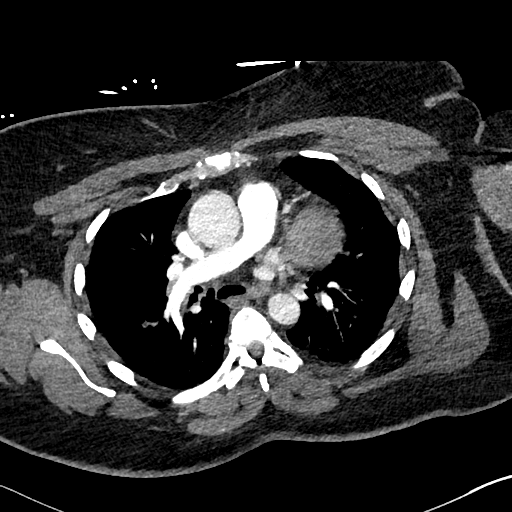
[im 234/344  lung]
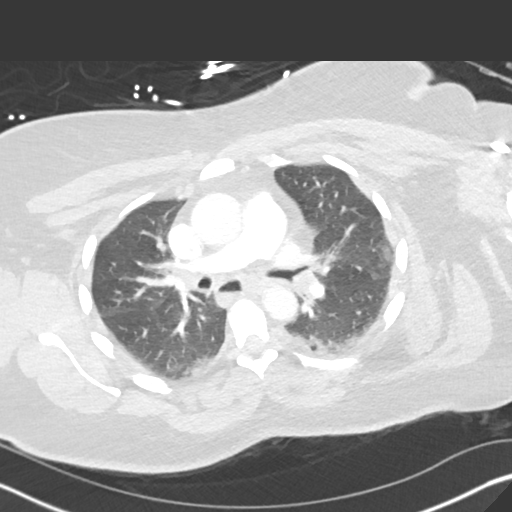
[im 250/344  mediastinal]
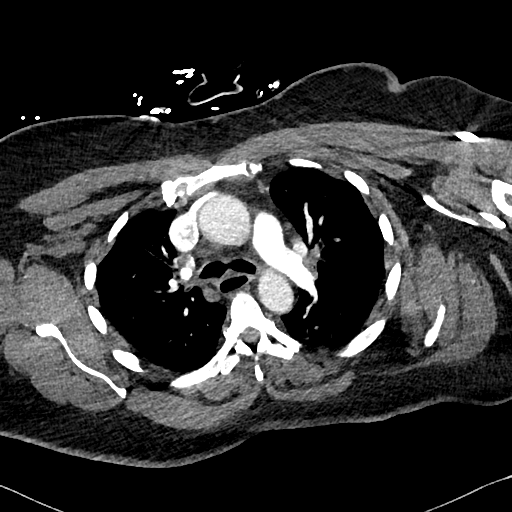
[im 266/344  lung]
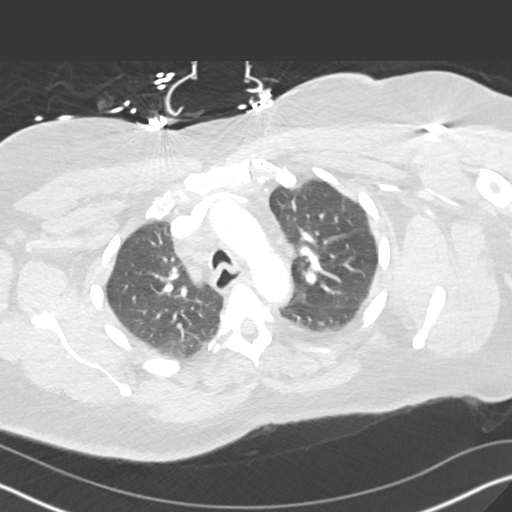
[im 297/344  mediastinal]
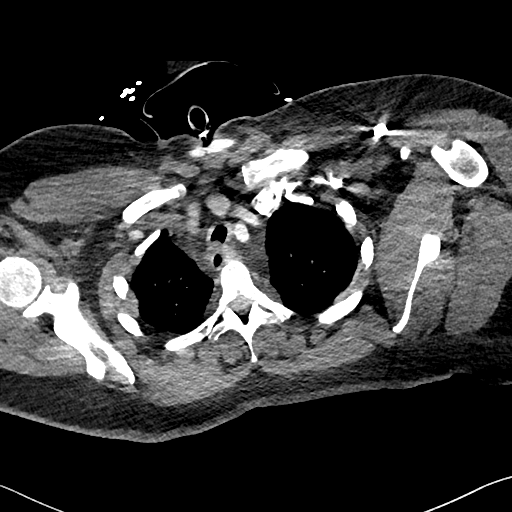
[im 312/344  lung]
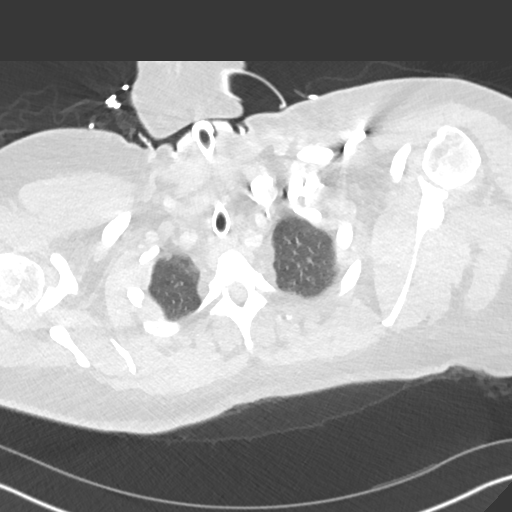
[im 328/344  mediastinal]
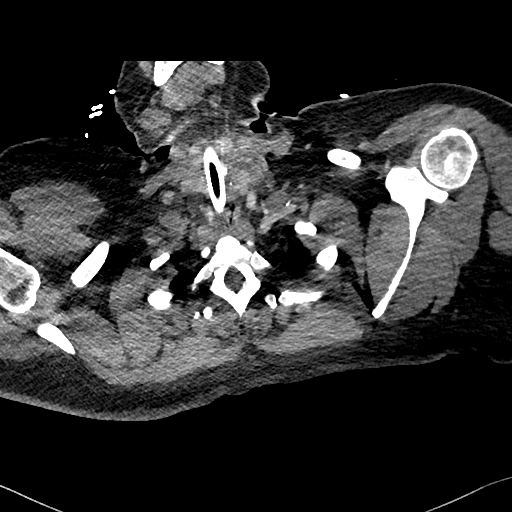

[Series 8: pe 2mm cor · coronal · 0.51mm/px · 1 of 97 slices shown]
[im 49/97  mediastinal]
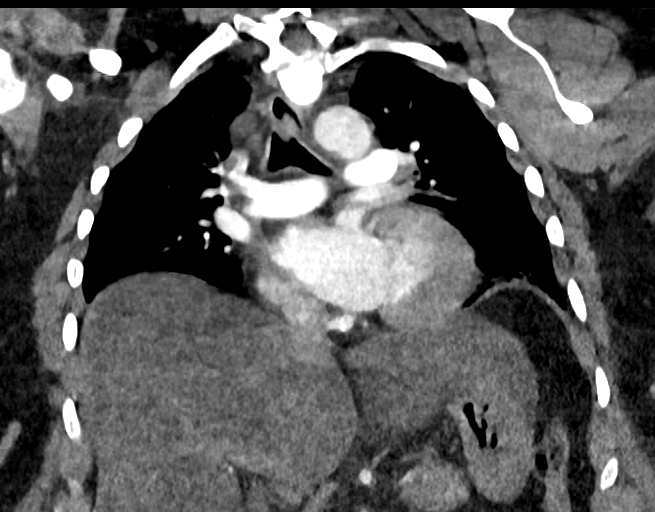

[19 of 36 positions shown; findings below may reference images not displayed]

FINDINGS: Cardiovascular: Satisfactory opacification of the pulmonary arteries
to the segmental level. No evidence of pulmonary embolism. There is
cardiomegaly. No pericardial effusion.

Mediastinum/Nodes: 1.9 cm right paratracheal node is unchanged since
the prior exams. No new or enlarging lymph nodes identified. Thyroid
gland appears normal. No pleural effusion. Mild dependent
atelectasis noted.

Lungs/Pleura: No pleural effusion. Mild dependent atelectasis noted.

Upper Abdomen: Fatty infiltration of liver is seen.

Musculoskeletal: No acute or focal bony abnormality.

Review of the MIP images confirms the above findings.
IMPRESSION: Negative for pulmonary embolus.

Cardiomegaly.

Fatty infiltration of liver.

## 2018-10-03 IMAGING — CR DG CHEST 2V
2 series · 2 of 2 positions shown · non-contrast
Comparison: Chest x-ray dated 07/02/2017.

CLINICAL DATA: Chest pain, shortness of breath and cough for 1
week. Fever last night.

EXAM:
CHEST - 2 VIEW

[chest pa]
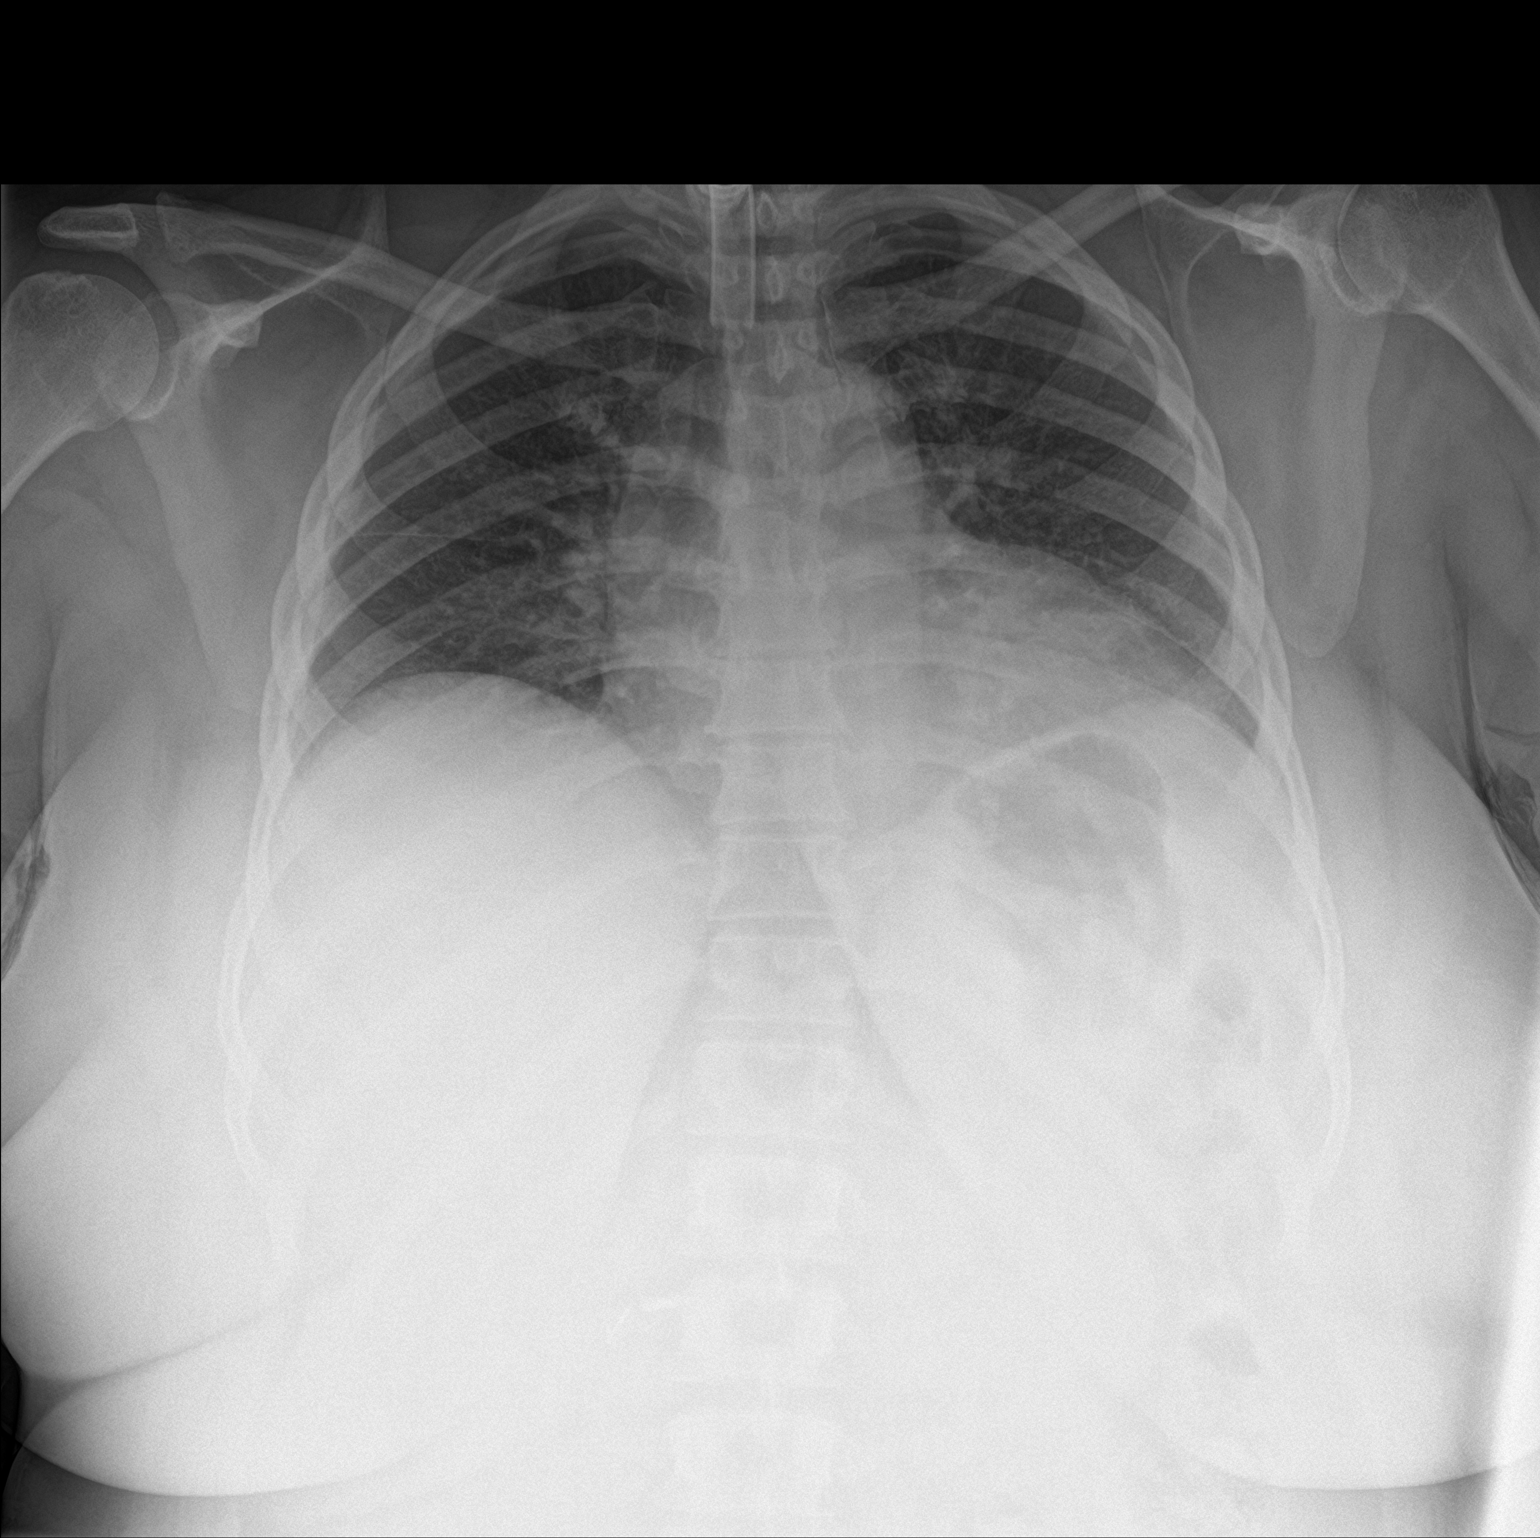

[chest lat]
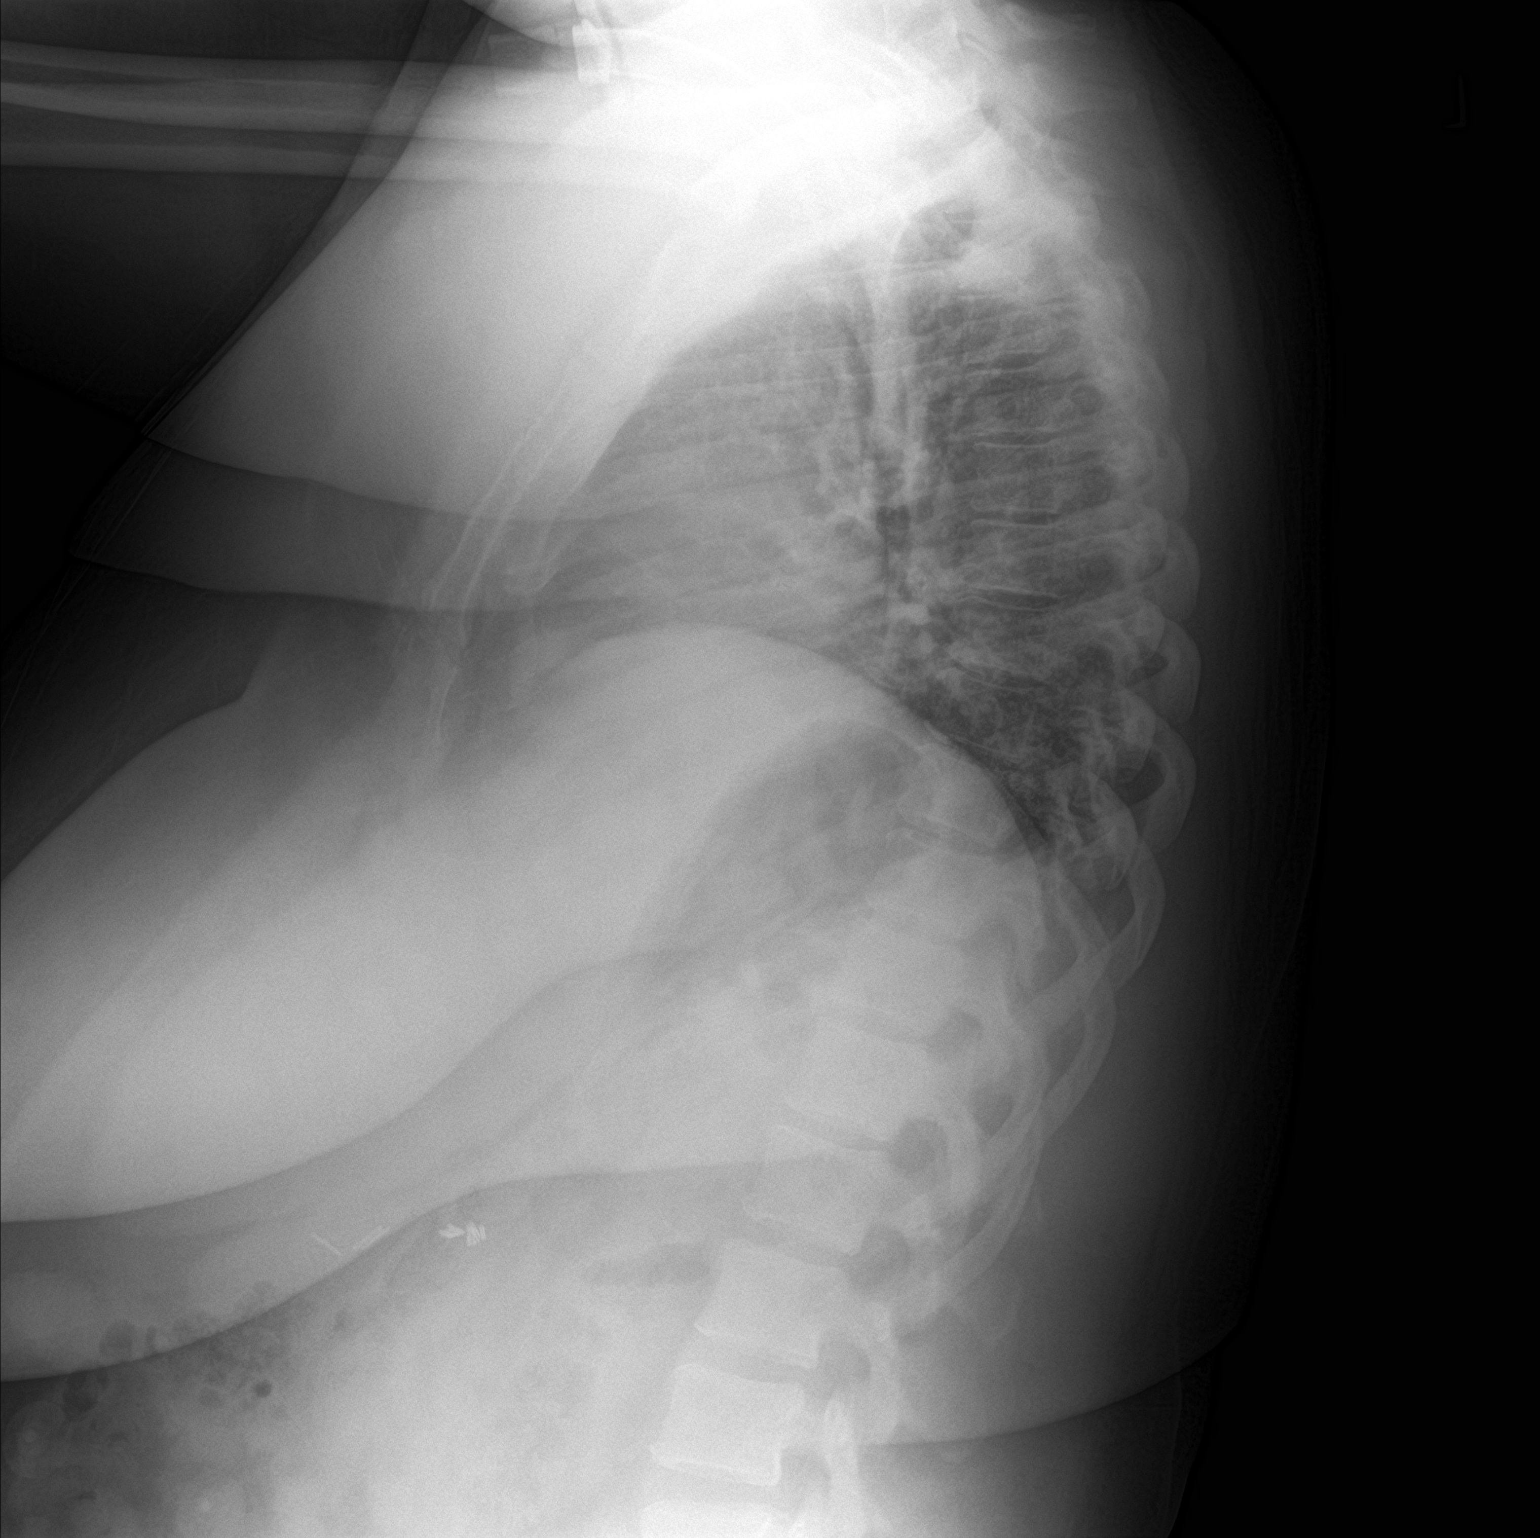

[2 of 2 positions shown; findings below may reference images not displayed]

FINDINGS: Study is hypoinspiratory with crowding of the perihilar and
bibasilar bronchovascular markings. Given the low lung volumes,
lungs appear clear. No confluent opacity to suggest a developing
pneumonia. No pleural effusion or pneumothorax seen.

Heart size and mediastinal contours are grossly stable. Tracheostomy
tube is in the midline. No acute or suspicious osseous finding.
IMPRESSION: No active cardiopulmonary disease. No evidence of pneumonia or
pulmonary edema.

## 2018-10-05 ENCOUNTER — Encounter: Payer: Self-pay | Admitting: Critical Care Medicine

## 2018-10-05 ENCOUNTER — Ambulatory Visit: Payer: No Typology Code available for payment source | Attending: Family Medicine | Admitting: Critical Care Medicine

## 2018-10-05 VITALS — BP 126/90 | HR 117 | Temp 98.9°F | Resp 18 | Ht 61.0 in | Wt 245.0 lb

## 2018-10-05 DIAGNOSIS — R079 Chest pain, unspecified: Secondary | ICD-10-CM | POA: Insufficient documentation

## 2018-10-05 DIAGNOSIS — F329 Major depressive disorder, single episode, unspecified: Secondary | ICD-10-CM | POA: Insufficient documentation

## 2018-10-05 DIAGNOSIS — M25511 Pain in right shoulder: Secondary | ICD-10-CM | POA: Diagnosis not present

## 2018-10-05 DIAGNOSIS — G8911 Acute pain due to trauma: Secondary | ICD-10-CM | POA: Insufficient documentation

## 2018-10-05 DIAGNOSIS — Z930 Tracheostomy status: Secondary | ICD-10-CM | POA: Insufficient documentation

## 2018-10-05 DIAGNOSIS — I10 Essential (primary) hypertension: Secondary | ICD-10-CM | POA: Insufficient documentation

## 2018-10-05 DIAGNOSIS — J4550 Severe persistent asthma, uncomplicated: Secondary | ICD-10-CM

## 2018-10-05 DIAGNOSIS — Z79899 Other long term (current) drug therapy: Secondary | ICD-10-CM | POA: Diagnosis not present

## 2018-10-05 DIAGNOSIS — Z7901 Long term (current) use of anticoagulants: Secondary | ICD-10-CM | POA: Insufficient documentation

## 2018-10-05 DIAGNOSIS — M545 Low back pain, unspecified: Secondary | ICD-10-CM

## 2018-10-05 DIAGNOSIS — E1142 Type 2 diabetes mellitus with diabetic polyneuropathy: Secondary | ICD-10-CM | POA: Diagnosis not present

## 2018-10-05 DIAGNOSIS — Z794 Long term (current) use of insulin: Secondary | ICD-10-CM | POA: Insufficient documentation

## 2018-10-05 DIAGNOSIS — G8929 Other chronic pain: Secondary | ICD-10-CM | POA: Diagnosis not present

## 2018-10-05 DIAGNOSIS — K219 Gastro-esophageal reflux disease without esophagitis: Secondary | ICD-10-CM | POA: Insufficient documentation

## 2018-10-05 DIAGNOSIS — Z8249 Family history of ischemic heart disease and other diseases of the circulatory system: Secondary | ICD-10-CM | POA: Diagnosis not present

## 2018-10-05 DIAGNOSIS — Z87891 Personal history of nicotine dependence: Secondary | ICD-10-CM | POA: Diagnosis not present

## 2018-10-05 DIAGNOSIS — M79631 Pain in right forearm: Secondary | ICD-10-CM | POA: Diagnosis not present

## 2018-10-05 DIAGNOSIS — Y92410 Unspecified street and highway as the place of occurrence of the external cause: Secondary | ICD-10-CM | POA: Insufficient documentation

## 2018-10-05 DIAGNOSIS — J45901 Unspecified asthma with (acute) exacerbation: Secondary | ICD-10-CM | POA: Insufficient documentation

## 2018-10-05 DIAGNOSIS — R51 Headache: Secondary | ICD-10-CM | POA: Diagnosis not present

## 2018-10-05 DIAGNOSIS — M25521 Pain in right elbow: Secondary | ICD-10-CM | POA: Diagnosis not present

## 2018-10-05 DIAGNOSIS — R490 Dysphonia: Secondary | ICD-10-CM | POA: Insufficient documentation

## 2018-10-05 HISTORY — DX: Acute pain due to trauma: M25.511

## 2018-10-05 HISTORY — DX: Acute pain due to trauma: G89.11

## 2018-10-05 MED ORDER — OXYCODONE-ACETAMINOPHEN 5-325 MG PO TABS: 1.0000 | ORAL_TABLET | Freq: Four times a day (QID) | ORAL | 0 refills | Status: AC | PRN

## 2018-10-05 MED ORDER — TIZANIDINE HCL 4 MG PO TABS: 4.0000 mg | ORAL_TABLET | Freq: Three times a day (TID) | ORAL | 3 refills | Status: DC | PRN

## 2018-10-05 MED ORDER — NAPROXEN 500 MG PO TABS: 500.0000 mg | ORAL_TABLET | Freq: Two times a day (BID) | ORAL | 0 refills | Status: DC | PRN
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
## 2018-10-05 NOTE — Patient Instructions (Signed)
A refill on the Percocet was sent to your [facility] pharmacy short-term  Begin Naprosyn twice daily as needed for pain in the shoulder  A referral to orthopedics was made for the right shoulder pain  Refills on the muscle relaxant was also sent to your pharmacy  Keep follow-up visit with your primary care provider

## 2018-10-06 ENCOUNTER — Ambulatory Visit: Payer: Medicare Other | Admitting: Endocrinology

## 2018-10-08 ENCOUNTER — Telehealth: Payer: Self-pay | Admitting: Endocrinology

## 2018-10-08 MED ORDER — GLUCOSE BLOOD VI STRP: ORAL_STRIP | 12 refills | Status: DC

## 2018-10-08 NOTE — Telephone Encounter (Signed)
RX sent

## 2018-10-08 NOTE — Telephone Encounter (Signed)
MEDICATION: glucose blood (ACCU-CHEK AVIVA) test strip  PHARMACY:  [facility] on [address]  IS THIS A 90 DAY SUPPLY : Yes  IS PATIENT OUT OF MEDICATION: Yes  IF NOT; HOW MUCH IS LEFT: None  LAST APPOINTMENT DATE: @12 /07/2018  NEXT APPOINTMENT DATE:@Visit  date not found  DO WE HAVE YOUR PERMISSION TO LEAVE A DETAILED MESSAGE:Yes  OTHER COMMENTS:    **Let patient know to contact pharmacy at the end of the day to make sure medication is ready. **  ** Please notify patient to allow 48-72 hours to process**  **Encourage patient to contact the pharmacy for refills or they can request refills through [facility]**

## 2018-10-11 ENCOUNTER — Other Ambulatory Visit: Payer: Self-pay | Admitting: Family Medicine

## 2018-10-13 ENCOUNTER — Ambulatory Visit (INDEPENDENT_AMBULATORY_CARE_PROVIDER_SITE_OTHER): Payer: Medicare Other | Admitting: Orthopedic Surgery

## 2018-10-20 ENCOUNTER — Encounter (INDEPENDENT_AMBULATORY_CARE_PROVIDER_SITE_OTHER): Payer: Self-pay | Admitting: Orthopedic Surgery

## 2018-10-20 ENCOUNTER — Ambulatory Visit (INDEPENDENT_AMBULATORY_CARE_PROVIDER_SITE_OTHER): Payer: Medicare Other | Admitting: Orthopedic Surgery

## 2018-10-20 VITALS — Ht 61.0 in | Wt 245.0 lb

## 2018-10-20 DIAGNOSIS — G8929 Other chronic pain: Secondary | ICD-10-CM | POA: Diagnosis not present

## 2018-10-20 DIAGNOSIS — M25511 Pain in right shoulder: Secondary | ICD-10-CM | POA: Diagnosis not present

## 2018-10-23 ENCOUNTER — Emergency Department (HOSPITAL_COMMUNITY): Payer: MEDICARE

## 2018-10-23 ENCOUNTER — Inpatient Hospital Stay (HOSPITAL_COMMUNITY)
Admission: EM | Admit: 2018-10-23 | Discharge: 2018-10-29 | DRG: 193 | Disposition: A | Discharge status: Home / Self Care | Payer: MEDICARE | Attending: Internal Medicine | Admitting: Internal Medicine

## 2018-10-23 DIAGNOSIS — J181 Lobar pneumonia, unspecified organism: Secondary | ICD-10-CM | POA: Diagnosis not present

## 2018-10-23 DIAGNOSIS — I110 Hypertensive heart disease with heart failure: Secondary | ICD-10-CM | POA: Diagnosis not present

## 2018-10-23 DIAGNOSIS — Z930 Tracheostomy status: Secondary | ICD-10-CM

## 2018-10-23 DIAGNOSIS — E1165 Type 2 diabetes mellitus with hyperglycemia: Secondary | ICD-10-CM | POA: Diagnosis not present

## 2018-10-23 DIAGNOSIS — I5032 Chronic diastolic (congestive) heart failure: Secondary | ICD-10-CM | POA: Diagnosis not present

## 2018-10-23 DIAGNOSIS — Z6841 Body Mass Index (BMI) 40.0 and over, adult: Secondary | ICD-10-CM

## 2018-10-23 DIAGNOSIS — J9601 Acute respiratory failure with hypoxia: Secondary | ICD-10-CM | POA: Diagnosis not present

## 2018-10-23 DIAGNOSIS — G894 Chronic pain syndrome: Secondary | ICD-10-CM | POA: Diagnosis not present

## 2018-10-23 DIAGNOSIS — N179 Acute kidney failure, unspecified: Secondary | ICD-10-CM | POA: Diagnosis present

## 2018-10-23 DIAGNOSIS — K219 Gastro-esophageal reflux disease without esophagitis: Secondary | ICD-10-CM | POA: Diagnosis not present

## 2018-10-23 DIAGNOSIS — J9622 Acute and chronic respiratory failure with hypercapnia: Secondary | ICD-10-CM | POA: Diagnosis present

## 2018-10-23 DIAGNOSIS — R0602 Shortness of breath: Secondary | ICD-10-CM | POA: Diagnosis not present

## 2018-10-23 DIAGNOSIS — G4733 Obstructive sleep apnea (adult) (pediatric): Secondary | ICD-10-CM | POA: Diagnosis not present

## 2018-10-23 DIAGNOSIS — Y92230 Patient room in hospital as the place of occurrence of the external cause: Secondary | ICD-10-CM | POA: Diagnosis not present

## 2018-10-23 DIAGNOSIS — G8929 Other chronic pain: Secondary | ICD-10-CM

## 2018-10-23 DIAGNOSIS — Z87891 Personal history of nicotine dependence: Secondary | ICD-10-CM

## 2018-10-23 DIAGNOSIS — I10 Essential (primary) hypertension: Secondary | ICD-10-CM | POA: Diagnosis not present

## 2018-10-23 DIAGNOSIS — Z8261 Family history of arthritis: Secondary | ICD-10-CM

## 2018-10-23 DIAGNOSIS — F419 Anxiety disorder, unspecified: Secondary | ICD-10-CM | POA: Diagnosis not present

## 2018-10-23 DIAGNOSIS — M1990 Unspecified osteoarthritis, unspecified site: Secondary | ICD-10-CM | POA: Diagnosis not present

## 2018-10-23 DIAGNOSIS — Z9049 Acquired absence of other specified parts of digestive tract: Secondary | ICD-10-CM

## 2018-10-23 DIAGNOSIS — J9602 Acute respiratory failure with hypercapnia: Secondary | ICD-10-CM | POA: Diagnosis not present

## 2018-10-23 DIAGNOSIS — M25511 Pain in right shoulder: Secondary | ICD-10-CM | POA: Diagnosis not present

## 2018-10-23 DIAGNOSIS — Z7951 Long term (current) use of inhaled steroids: Secondary | ICD-10-CM

## 2018-10-23 DIAGNOSIS — Z79890 Hormone replacement therapy (postmenopausal): Secondary | ICD-10-CM

## 2018-10-23 DIAGNOSIS — J9621 Acute and chronic respiratory failure with hypoxia: Secondary | ICD-10-CM | POA: Diagnosis not present

## 2018-10-23 DIAGNOSIS — J4551 Severe persistent asthma with (acute) exacerbation: Secondary | ICD-10-CM | POA: Diagnosis not present

## 2018-10-23 DIAGNOSIS — J189 Pneumonia, unspecified organism: Secondary | ICD-10-CM | POA: Diagnosis not present

## 2018-10-23 DIAGNOSIS — M549 Dorsalgia, unspecified: Secondary | ICD-10-CM | POA: Diagnosis present

## 2018-10-23 DIAGNOSIS — Z833 Family history of diabetes mellitus: Secondary | ICD-10-CM

## 2018-10-23 DIAGNOSIS — T380X5A Adverse effect of glucocorticoids and synthetic analogues, initial encounter: Secondary | ICD-10-CM | POA: Diagnosis not present

## 2018-10-23 DIAGNOSIS — Z79899 Other long term (current) drug therapy: Secondary | ICD-10-CM

## 2018-10-23 DIAGNOSIS — E1142 Type 2 diabetes mellitus with diabetic polyneuropathy: Secondary | ICD-10-CM | POA: Diagnosis not present

## 2018-10-23 DIAGNOSIS — Z888 Allergy status to other drugs, medicaments and biological substances status: Secondary | ICD-10-CM

## 2018-10-23 DIAGNOSIS — J45901 Unspecified asthma with (acute) exacerbation: Secondary | ICD-10-CM | POA: Diagnosis not present

## 2018-10-23 DIAGNOSIS — Z794 Long term (current) use of insulin: Secondary | ICD-10-CM

## 2018-10-23 DIAGNOSIS — Z8249 Family history of ischemic heart disease and other diseases of the circulatory system: Secondary | ICD-10-CM

## 2018-10-23 DIAGNOSIS — R05 Cough: Secondary | ICD-10-CM | POA: Diagnosis not present

## 2018-10-23 DIAGNOSIS — R490 Dysphonia: Secondary | ICD-10-CM | POA: Diagnosis present

## 2018-10-23 HISTORY — DX: Acute respiratory failure with hypoxia: J96.01

## 2018-10-23 LAB — BASIC METABOLIC PANEL
Anion gap: 14 (ref 5–15)
BUN: 16 mg/dL (ref 6–20)
CO2: 20 mmol/L — AB (ref 22–32)
Calcium: 9 mg/dL (ref 8.9–10.3)
Chloride: 103 mmol/L (ref 98–111)
Creatinine, Ser: 1.31 mg/dL — ABNORMAL HIGH (ref 0.44–1.00)
GFR calc Af Amer: 55 mL/min — ABNORMAL LOW (ref >60)
GFR calc non Af Amer: 48 mL/min — ABNORMAL LOW (ref >60)
Glucose, Bld: 330 mg/dL — ABNORMAL HIGH (ref 70–99)
Potassium: 4 mmol/L (ref 3.5–5.1)
Sodium: 137 mmol/L (ref 135–145)

## 2018-10-23 LAB — CBC
HCT: 38.8 % (ref 36.0–46.0)
Hemoglobin: 12.2 g/dL (ref 12.0–15.0)
MCH: 27.5 pg (ref 26.0–34.0)
MCHC: 31.4 g/dL (ref 30.0–36.0)
MCV: 87.4 fL (ref 80.0–100.0)
Platelets: 349 10*3/uL (ref 150–400)
RBC: 4.44 MIL/uL (ref 3.87–5.11)
RDW: 13.3 % (ref 11.5–15.5)
WBC: 11.6 10*3/uL — ABNORMAL HIGH (ref 4.0–10.5)
nRBC: 0 % (ref 0.0–0.2)

## 2018-10-23 LAB — TROPONIN I: Troponin I: <0.03 ng/mL (ref <0.03)

## 2018-10-23 LAB — D-DIMER, QUANTITATIVE: D-Dimer, Quant: 0.93 ug/mL-FEU — ABNORMAL HIGH (ref 0.00–0.50)

## 2018-10-23 MED ORDER — IOPAMIDOL (ISOVUE-370) INJECTION 76%
INTRAVENOUS | Status: AC
  Filled 2018-10-23: qty 100

## 2018-10-23 MED ORDER — SODIUM CHLORIDE 0.9 % IV SOLN
500.0000 mg | Freq: Once | INTRAVENOUS | Status: AC
  Administered 2018-10-23: 500 mg via INTRAVENOUS
  Filled 2018-10-23: qty 500

## 2018-10-23 MED ORDER — IPRATROPIUM-ALBUTEROL 0.5-2.5 (3) MG/3ML IN SOLN
3.0000 mL | Freq: Once | RESPIRATORY_TRACT | Status: AC
  Administered 2018-10-23: 3 mL via RESPIRATORY_TRACT
  Filled 2018-10-23: qty 3

## 2018-10-23 MED ORDER — SODIUM CHLORIDE 0.9 % IV SOLN
2.0000 g | Freq: Once | INTRAVENOUS | Status: AC
  Administered 2018-10-23: 2 g via INTRAVENOUS
  Filled 2018-10-23: qty 20

## 2018-10-23 MED ORDER — IOPAMIDOL (ISOVUE-370) INJECTION 76%
100.0000 mL | Freq: Once | INTRAVENOUS | Status: AC | PRN
  Administered 2018-10-23: 100 mL via INTRAVENOUS

## 2018-10-23 MED ORDER — HYDROMORPHONE HCL 1 MG/ML IJ SOLN
1.0000 mg | Freq: Once | INTRAMUSCULAR | Status: AC
  Administered 2018-10-23: 1 mg via INTRAVENOUS
  Filled 2018-10-23: qty 1

## 2018-10-23 MED ORDER — IPRATROPIUM-ALBUTEROL 0.5-2.5 (3) MG/3ML IN SOLN
RESPIRATORY_TRACT | Status: AC
  Administered 2018-10-23: 3 mL via RESPIRATORY_TRACT
  Filled 2018-10-23: qty 3

## 2018-10-23 MED ORDER — IPRATROPIUM-ALBUTEROL 0.5-2.5 (3) MG/3ML IN SOLN
3.0000 mL | Freq: Once | RESPIRATORY_TRACT | Status: AC
  Administered 2018-10-23: 3 mL via RESPIRATORY_TRACT

## 2018-10-23 MED ORDER — METHYLPREDNISOLONE SODIUM SUCC 125 MG IJ SOLR
125.0000 mg | Freq: Once | INTRAMUSCULAR | Status: AC
  Administered 2018-10-23: 125 mg via INTRAVENOUS
  Filled 2018-10-23: qty 2

## 2018-10-23 NOTE — ED Notes (Signed)
Pt called out at this time asking for "pain and cough medication". RN aware.

## 2018-10-23 NOTE — ED Provider Notes (Signed)
[facility] Emergency Department Provider Note MRN:  [id number]  Arrival date & time: 10/23/18     Chief Complaint   Shortness of Breath   History of Present Illness   [name] is a 48 y.o. year-old female with a history of hypertension, diabetes, asthma, chronic respiratory failure status post tracheostomy presenting to the ED with chief complaint of shortness of breath.  3 days of progressively worsening shortness of breath with persisting cough.  Denies fever, beginning to have hemoptysis today.  Shortness of breath is moderate to severe, no exacerbating relieving factors.  Endorsing squeezing central chest pain, currently 5 out of 10 in severity.  Denies abdominal pain, no leg pain or swelling recently.  Review of Systems  A complete 10 system review of systems was obtained and all systems are negative except as noted in the HPI and PMH.   Patient's Health History    Past Medical History:  Diagnosis Date  . Arthritis   . Asthma   . Chronic back pain   . Chronic chest pain   . Depression   . DM (diabetes mellitus) ([place])    INSULIN DEPENDENT  . GERD (gastroesophageal reflux disease)   . Headache(784.0)   . Hypertension   . Hypokalemia   . Respiratory disease 05/2017  . Tracheostomy in place [facility]) 02/2017    Past Surgical History:  Procedure Laterality Date  . APPENDECTOMY    . CESAREAN SECTION     x 3  . CHOLECYSTECTOMY N/A 03/02/2014   Procedure: LAPAROSCOPIC CHOLECYSTECTOMY;  Surgeon: Joyice Faster. Cornett, MD;  Location: [place];  Service: General;  Laterality: N/A;  . HERNIA REPAIR    . PANENDOSCOPY N/A 03/04/2017   Procedure: PANENDOSCOPY WITH POSSIBLE FOREIGN BODY REMOVAL;  Surgeon: Jodi Marble, MD;  Location: [place];  Service: ENT;  Laterality: N/A;  . ROTATOR CUFF REPAIR    . TRACHEOSTOMY  02/2017  . VESICOVAGINAL FISTULA CLOSURE W/ TAH  2009    Family History  Problem Relation Age of Onset  . Heart attack Mother   .  Stroke Mother   . Diabetes Mother   . Hypertension Mother   . Arthritis Mother   . Stroke Father   . Hypertension Sister   . Diabetes Sister   . Seizures Brother   . Diabetes Brother     Social History   Socioeconomic History  . Marital status: Married    Spouse name: Not on file  . Number of children: Not on file  . Years of education: Not on file  . Highest education level: Not on file  Occupational History  . Not on file  Social Needs  . Financial resource strain: Not on file  . Food insecurity:    Worry: Not on file    Inability: Not on file  . Transportation needs:    Medical: Not on file    Non-medical: Not on file  Tobacco Use  . Smoking status: Former Smoker    Packs/day: 0.25    Years: 22.00    Pack years: 5.50    Types: Cigarettes    Last attempt to quit: 01/20/2017    Years since quitting: 1.7  . Smokeless tobacco: Never Used  Substance and Sexual Activity  . Alcohol use: No    Alcohol/week: 1.0 - 2.0 standard drinks    Types: 1 - 2 Glasses of wine per week    Comment: socially  . Drug use: No  . Sexual activity: Yes  Partners: Male    Birth control/protection: None  Lifestyle  . Physical activity:    Days per week: Not on file    Minutes per session: Not on file  . Stress: Not on file  Relationships  . Social connections:    Talks on phone: Not on file    Gets together: Not on file    Attends religious service: Not on file    Active member of club or organization: Not on file    Attends meetings of clubs or organizations: Not on file    Relationship status: Not on file  . Intimate partner violence:    Fear of current or ex partner: Not on file    Emotionally abused: Not on file    Physically abused: Not on file    Forced sexual activity: Not on file  Other Topics Concern  . Not on file  Social History Narrative  . Not on file     Physical Exam  Vital Signs and Nursing Notes reviewed Vitals:   10/23/18 2202 10/23/18 2230  BP:      Pulse: 96 89  Resp: 20 16  Temp:    SpO2: 100% 100%    CONSTITUTIONAL: Chronically ill-appearing, NAD NEURO:  Alert and oriented x 3, no focal deficits EYES:  eyes equal and reactive ENT/NECK:  no LAD, no JVD; tracheostomy tube in place CARDIO: Regular rate, well-perfused, normal S1 and S2 PULM: Scattered wheezing GI/GU:  normal bowel sounds, non-distended, non-tender MSK/SPINE:  No gross deformities, no edema SKIN:  no rash, atraumatic PSYCH:  Appropriate speech and behavior  Diagnostic and Interventional Summary    EKG Interpretation  Date/Time:  Saturday October 23 2018 16:32:33 EST Ventricular Rate:  93 PR Interval:    QRS Duration: 88 QT Interval:  372 QTC Calculation: 463 R Axis:   -2 Text Interpretation:  Sinus rhythm Confirmed by Gerlene Fee [phone]) on 10/23/2018 8:01:39 PM      Labs Reviewed  CBC - Abnormal; Notable for the following components:      Result Value   WBC 11.6 (*)    All other components within normal limits  BASIC METABOLIC PANEL - Abnormal; Notable for the following components:   CO2 20 (*)    Glucose, Bld 330 (*)    Creatinine, Ser 1.31 (*)    GFR calc non Af Amer 48 (*)    GFR calc Af Amer 55 (*)    All other components within normal limits  D-DIMER, QUANTITATIVE (NOT AT [facility]) - Abnormal; Notable for the following components:   D-Dimer, Quant 0.93 (*)    All other components within normal limits  TROPONIN I    CT ANGIO CHEST PE W OR WO CONTRAST  Final Result    DG Chest Port 1 View  Final Result      Medications  iopamidol (ISOVUE-370) 76 % injection (has no administration in time range)  ipratropium-albuterol (DUONEB) 0.5-2.5 (3) MG/3ML nebulizer solution 3 mL (3 mLs Nebulization Given 10/23/18 1632)  methylPREDNISolone sodium succinate (SOLU-MEDROL) 125 mg/2 mL injection 125 mg (125 mg Intravenous Given 10/23/18 1648)  ipratropium-albuterol (DUONEB) 0.5-2.5 (3) MG/3ML nebulizer solution 3 mL (3 mLs Nebulization Given 10/23/18 1636)   ipratropium-albuterol (DUONEB) 0.5-2.5 (3) MG/3ML nebulizer solution 3 mL (3 mLs Nebulization Given 10/23/18 1742)  HYDROmorphone (DILAUDID) injection 1 mg (1 mg Intravenous Given 10/23/18 2031)  cefTRIAXone (ROCEPHIN) 2 g in sodium chloride 0.9 % 100 mL IVPB (0 g Intravenous Stopped 10/23/18 2135)  azithromycin (ZITHROMAX) 500  mg in sodium chloride 0.9 % 250 mL IVPB (0 mg Intravenous Stopped 10/23/18 2240)  iopamidol (ISOVUE-370) 76 % injection 100 mL (100 mLs Intravenous Contrast Given 10/23/18 2109)     Procedures Critical Care Critical Care Documentation Critical care time provided by me (excluding procedures): 36 minutes  Condition necessitating critical care: Hypoxic respiratory failure, severe asthma exacerbation  Components of critical care management: reviewing of prior records, laboratory and imaging interpretation, frequent re-examination and reassessment of vital signs, administration of multiple duo nebs, IV Solu-Medrol, tracheostomy management, discussion with consulting services    ED Course and Medical Decision Making  I have reviewed the triage vital signs and the nursing notes.  Pertinent labs & imaging results that were available during my care of the patient were reviewed by me and considered in my medical decision making (see below for details).  Favoring asthma exacerbation in this 48 year old female with chronic respiratory failure and tracheostomy tube.  Also considering PE given the hemoptysis but felt to be less likely.  Wheezing throughout, will provide steroids, duo nebs, screening with d-dimer.  D-dimer positive, CTPA negative, admitted to hospital service for further care.  Barth Kirks. Sedonia Small, Day [email or web address]  Final Clinical Impressions(s) / ED Diagnoses     ICD-10-CM   1. Severe asthma with exacerbation, unspecified whether persistent J45.901   2. SOB (shortness of breath) R06.02 DG Chest [facility]    DG Chest [place] 1 View    ED Discharge Orders    None         Maudie Flakes, MD 10/23/18 2325

## 2018-10-23 NOTE — ED Notes (Signed)
Patient transported to CT 

## 2018-10-23 NOTE — ED Triage Notes (Addendum)
Patient was dropped off by family member for SOB, patient has trach present. Has been suctioning out mucus that's bloody, cough present as well.

## 2018-10-23 NOTE — H&P (Signed)
History and Physical   [name] JSH:[id number] DOB: [birth year] DOA: 10/23/2018  Referring MD/NP/PA: Dr. Sedonia Small  PCP: Brittney Rakes, MD   Outpatient Specialists: None  Patient coming from: Home  Chief Complaint: Shortness of breath  HPI: [name] is a 48 y.o. female with medical history significant of tracheal stenosis status post tracheostomy who is currently tracheostomy dependent, diabetes, morbid obesity, hypertension, GERD, severe persistent asthma and chronic pain syndrome who came to the ER with progressive shortness of breath cough and significant anxiety.  Symptoms started about 2 days ago and have persisted. She has persistent cough but no fever or chills.  Denied any nausea or vomiting.  She has some chest discomfort from coughing.  Patient very anxious and worried.  She has dysphonia from her tracheostomy but able to communicate.  Initial evaluation showed diffuse rhonchi and wheeze consistent with acute exacerbation of her asthma.  X-ray suggested possible pneumonia as well so she is being admitted to the hospitalist service.  ED Course: Temperature 98.8 blood pressure 159/98, pulse is 109 respiratory 28 oxygen sat 90% on 2 L.  White count is 11.6 with hemoglobin 12.2 and platelet 349.  Chemistry is normal except for glucose 330 creatinine 1.31.  CT angiogram of the chest showed no PE.  Chest x-ray showed hazy right infrahilar atelectasis or developing infiltrate with mild cardiomegaly.  Patient was given steroids oxygen nebulizer treatment as well as IV Rocephin and Zithromax in the ER.  She is being admitted for inpatient care.  Review of Systems: As per HPI otherwise 10 point review of systems negative.    Past Medical History:  Diagnosis Date  . Arthritis   . Asthma   . Chronic back pain   . Chronic chest pain   . Depression   . DM (diabetes mellitus) ([place])    INSULIN DEPENDENT  . GERD (gastroesophageal reflux disease)   .  Headache(784.0)   . Hypertension   . Hypokalemia   . Respiratory disease 05/2017  . Tracheostomy in place [facility]) 02/2017    Past Surgical History:  Procedure Laterality Date  . APPENDECTOMY    . CESAREAN SECTION     x 3  . CHOLECYSTECTOMY N/A 03/02/2014   Procedure: LAPAROSCOPIC CHOLECYSTECTOMY;  Surgeon: Joyice Faster. Cornett, MD;  Location: [place];  Service: General;  Laterality: N/A;  . HERNIA REPAIR    . PANENDOSCOPY N/A 03/04/2017   Procedure: PANENDOSCOPY WITH POSSIBLE FOREIGN BODY REMOVAL;  Surgeon: Jodi Marble, MD;  Location: [place];  Service: ENT;  Laterality: N/A;  . ROTATOR CUFF REPAIR    . TRACHEOSTOMY  02/2017  . VESICOVAGINAL FISTULA CLOSURE W/ TAH  2009     reports that she quit smoking about 21 months ago. Her smoking use included cigarettes. She has a 5.50 pack-year smoking history. She has never used smokeless tobacco. She reports that she does not drink alcohol or use drugs.  Allergies  Allergen Reactions  . Reglan [Metoclopramide] Other (See Comments)    Panic attack    Family History  Problem Relation Age of Onset  . Heart attack Mother   . Stroke Mother   . Diabetes Mother   . Hypertension Mother   . Arthritis Mother   . Stroke Father   . Hypertension Sister   . Diabetes Sister   . Seizures Brother   . Diabetes Brother      Prior to Admission medications   Medication Sig Start Date End Date Taking? Authorizing Provider  Star  LANCETS lancets Use as instructed to check blood sugar once daily. E11.9 06/18/17   Brittney Rakes, MD  albuterol (PROAIR HFA) 108 (90 Base) MCG/ACT inhaler Inhale 2 puffs into the lungs every 6 (six) hours as needed for wheezing or shortness of breath. 01/18/18   Brittney Rakes, MD  albuterol (PROVENTIL) (2.5 MG/3ML) 0.083% nebulizer solution Take 3 mLs (2.5 mg total) by nebulization every 6 (six) hours as needed for wheezing or shortness of breath. 04/12/18   Brayton Caves, PA-C  amLODipine (NORVASC) 10 MG tablet  Take 1 tablet (10 mg total) by mouth daily. 01/18/18   Brittney Rakes, MD  Artificial Tear Ointment (ARTIFICIAL TEARS) ointment Place 1 application into both eyes at bedtime.     [provider]  atorvastatin (LIPITOR) 20 MG tablet Take 1 tablet (20 mg total) by mouth daily. 01/18/18   Brittney Rakes, MD  Blood Glucose Monitoring Suppl (ACCU-CHEK AVIVA PLUS) w/Device KIT Used to check blood sugars once daily. E11.9 06/11/17   Brittney Rakes, MD  budesonide-formoterol (SYMBICORT) 160-4.5 MCG/ACT inhaler Inhale 2 puffs into the lungs 2 (two) times daily. 01/18/18   Brittney Rakes, MD  buPROPion (WELLBUTRIN SR) 150 MG 12 hr tablet Take 1 tablet (150 mg total) by mouth 2 (two) times daily. 06/23/18   Brittney Rakes, MD  carvedilol (COREG) 12.5 MG tablet Take 2 tablets (25 mg total) by mouth 2 (two) times daily with a meal. 07/09/18   Ladell Pier, MD  furosemide (LASIX) 20 MG tablet Take 1 tablet (20 mg total) by mouth daily. 07/20/18   Brittney Rakes, MD  gabapentin (NEURONTIN) 300 MG capsule Take 2 capsules (600 mg total) by mouth 2 (two) times daily. 01/18/18   Brittney Rakes, MD  glucose blood (ACCU-CHEK AVIVA) test strip Use as directed to check blood sugars once daily. E11.9 10/08/18   Renato Shin, MD  hydrOXYzine (ATARAX/VISTARIL) 25 MG tablet Take 1 tablet (25 mg total) by mouth 3 (three) times daily as needed. 09/24/18   Brittney Rakes, MD  Insulin Degludec (TRESIBA FLEXTOUCH) 200 UNIT/ML SOPN Inject 130 Units into the skin daily. And pen needles 1/day 09/01/18   Renato Shin, MD  Insulin Pen Needle (FIFTY50 PEN NEEDLES) 31G X 8 MM MISC Inject 1 Units as directed daily. 07/30/18   [provider]  lisinopril (PRINIVIL,ZESTRIL) 5 MG tablet Take 1 tablet (5 mg total) by mouth daily. 07/28/18   Brittney Rakes, MD  Melatonin 3 MG TABS Take 3 mg by mouth at bedtime as needed (for sleep).  10/31/17   [provider]  montelukast (SINGULAIR) 10 MG tablet Take 1 tablet (10  mg total) by mouth at bedtime. 01/18/18   Brittney Rakes, MD  naproxen (NAPROSYN) 500 MG tablet Take 1 tablet (500 mg total) by mouth 2 (two) times daily as needed for mild pain or moderate pain. 10/05/18   Elsie Stain, MD  sertraline (ZOLOFT) 100 MG tablet Place 1 tablet (100 mg total) into feeding tube daily. Patient taking differently: Take 100 mg by mouth daily.  04/12/18   Brayton Caves, PA-C  sodium chloride HYPERTONIC 3 % nebulizer solution Take 4 mLs by nebulization 2 (two) times daily. 05/08/17   Aline August, MD  SUMAtriptan (IMITREX) 100 MG tablet At the onset of a migraine; may repeat in 2 hours. Max daily dose 235m 06/15/18   NCharlott Rakes MD  tiZANidine (ZANAFLEX) 4 MG tablet Take 1 tablet (4 mg total) by mouth every 8 (eight) hours as needed for muscle  spasms. 10/05/18   Elsie Stain, MD  topiramate (TOPAMAX) 100 MG tablet Take 1 tablet (100 mg total) by mouth 2 (two) times daily. 01/18/18   Brittney Rakes, MD  fluticasone-salmeterol (ADVAIR HFA) 230-21 MCG/ACT inhaler Inhale 2 puffs into the lungs 2 (two) times daily. 10/15/17 10/15/17  Brittney Rakes, MD    Physical Exam: Vitals:   10/23/18 2032 10/23/18 2157 10/23/18 2202 10/23/18 2230  BP: (!) 156/76     Pulse: 89  96 89  Resp: _0 Temp:      TempSrc:      SpO2: 90% 99% 100% 100%      Constitutional: Morbidly obese, in moderate respiratory distress Vitals:   10/23/18 2032 10/23/18 2157 10/23/18 2202 10/23/18 2230  BP: (!) 156/76     Pulse: 89  96 89  Resp: _1 Temp:      TempSrc:      SpO2: 90% 99% 100% 100%   Eyes: PERRL, lids and conjunctivae normal ENMT: Mucous membranes are moist. Posterior pharynx clear of any exudate or lesions.Normal dentition.  Neck: normal, supple, no masses, no thyromegaly Respiratory: Decreased air entry bilaterally, marked expiratory wheezing, bilateral coarse rhonchi cardiovascular: Regular rate and rhythm, no murmurs / rubs / gallops. No extremity edema.  2+ pedal pulses. No carotid bruits.  Abdomen: no tenderness, no masses palpated. No hepatosplenomegaly. Bowel sounds positive.  Musculoskeletal: no clubbing / cyanosis. No joint deformity upper and lower extremities. Good ROM, no contractures. Normal muscle tone.  Skin: no rashes, lesions, ulcers. No induration Neurologic: CN 2-12 grossly intact. Sensation intact, DTR normal. Strength 5/5 in all 4.  Psychiatric: Normal judgment and insight. Alert and oriented x 3. Normal mood.     Labs on Admission: I have personally reviewed following labs and imaging studies  CBC: Recent Labs  Lab 10/23/18 1630  WBC 11.6*  HGB 12.2  HCT 38.8  MCV 87.4  PLT 623   Basic Metabolic Panel: Recent Labs  Lab 10/23/18 1630  NA 137  K 4.0  CL 103  CO2 20*  GLUCOSE 330*  BUN 16  CREATININE 1.31*  CALCIUM 9.0   GFR: Estimated Creatinine Clearance: 59.9 mL/min (A) (by C-G formula based on SCr of 1.31 mg/dL (H)). Liver Function Tests: No results for input(s): AST, ALT, ALKPHOS, BILITOT, PROT, ALBUMIN in the last 168 hours. No results for input(s): LIPASE, AMYLASE in the last 168 hours. No results for input(s): AMMONIA in the last 168 hours. Coagulation Profile: No results for input(s): INR, PROTIME in the last 168 hours. Cardiac Enzymes: Recent Labs  Lab 10/23/18 1630  TROPONINI <0.03   BNP (last 3 results) No results for input(s): PROBNP in the last 8760 hours. HbA1C: No results for input(s): HGBA1C in the last 72 hours. CBG: No results for input(s): GLUCAP in the last 168 hours. Lipid Profile: No results for input(s): CHOL, HDL, LDLCALC, TRIG, CHOLHDL, LDLDIRECT in the last 72 hours. Thyroid Function Tests: No results for input(s): TSH, T4TOTAL, FREET4, T3FREE, THYROIDAB in the last 72 hours. Anemia Panel: No results for input(s): VITAMINB12, FOLATE, FERRITIN, TIBC, IRON, RETICCTPCT in the last 72 hours. Urine analysis:    Component Value Date/Time   COLORURINE STRAW (A)  03/06/2018 1212   APPEARANCEUR CLEAR 03/06/2018 1212   LABSPEC 1.030 03/06/2018 1212   PHURINE 6.0 03/06/2018 1212   GLUCOSEU >=500 (A) 03/06/2018 1212   HGBUR NEGATIVE 03/06/2018 1212   BILIRUBINUR neg 04/12/2018 1006   KETONESUR NEGATIVE 03/06/2018  1212   PROTEINUR Positive (A) 04/12/2018 1006   PROTEINUR NEGATIVE 03/06/2018 1212   UROBILINOGEN 0.2 04/12/2018 1006   UROBILINOGEN 0.2 11/18/2014 2325   NITRITE neg 04/12/2018 1006   NITRITE NEGATIVE 03/06/2018 1212   LEUKOCYTESUR Negative 04/12/2018 1006   Sepsis Labs: _0 (procalcitonin:4,lacticidven:4) )No results found for this or any previous visit (from the past 240 hour(s)).   Radiological Exams on Admission: Ct Angio Chest Pe W Or Wo Contrast  Result Date: 10/23/2018 CLINICAL DATA:  Shortness of breath. Bloody mucus via trach. Cough. EXAM: CT ANGIOGRAPHY CHEST WITH CONTRAST TECHNIQUE: Multidetector CT imaging of the chest was performed using the standard protocol during bolus administration of intravenous contrast. Multiplanar CT image reconstructions and MIPs were obtained to evaluate the vascular anatomy. CONTRAST:  160m ISOVUE-370 IOPAMIDOL (ISOVUE-370) INJECTION 76% COMPARISON:  03/05/2018 FINDINGS: Cardiovascular: Heart is enlarged. No substantial pericardial effusion. Ascending thoracic aorta measures 3.9 cm maximum dimension. The no filling defect identified in the opacified pulmonary arteries to suggest the presence of an acute pulmonary embolus. Mediastinum/Nodes: 1.8 cm short axis right paratracheal lymph node is stable in the interval. Upper normal lymph nodes are seen in the right hilum. No left hilar lymphadenopathy. The esophagus has normal imaging features. There is no axillary lymphadenopathy. Lungs/Pleura: Tracheostomy tube again noted. Tip is positioned in the proximal to mid trachea. No focal airspace consolidation. No suspicious nodule or mass. No pleural effusion. Upper Abdomen: The liver shows diffusely  decreased attenuation suggesting steatosis. Otherwise unremarkable. Musculoskeletal: No worrisome lytic or sclerotic osseous abnormality. Review of the MIP images confirms the above findings. IMPRESSION: 1. No CT evidence for acute pulmonary embolus. 2. Stable cardiomegaly. 3. Hepatic steatosis Electronically Signed   By: EMisty StanleyM.D.   On: 10/23/2018 21:40   Dg Chest Port 1 View  Result Date: 10/23/2018 CLINICAL DATA:  Shortness of breath and cough EXAM: PORTABLE CHEST 1 VIEW COMPARISON:  03/05/2018 FINDINGS: Tracheostomy tube is in place. Mild cardiomegaly. Hazy right infrahilar atelectasis or developing infiltrate. No pneumothorax. Probable small right effusion. IMPRESSION: 1. Hazy right infrahilar atelectasis or developing infiltrate. Probable small right effusion. 2. Mild cardiomegaly Electronically Signed   By: KDonavan FoilM.D.   On: 10/23/2018 16:59    EKG: Independently reviewed.  It shows normal sinus rhythm with a rate of 95.  No significant ST changes.  Assessment/Plan Principal Problem:   Acute respiratory failure with hypoxia and hypercapnia (HCC) Active Problems:   Sleep apnea, obstructive   Essential hypertension   Type 2 diabetes mellitus with diabetic polyneuropathy, without long-term current use of insulin (HCC)   Morbid obesity (HCC)   Tracheostomy dependent (HCC)   Asthma, severe persistent     #1 acute on chronic respiratory failure: Patient is relatively hypoxemic.  She is struggling to breathe.  Most likely combination of asthma exacerbation and early pneumonia.  We will admit the patient and initiate IV steroids antibiotics as well as nebulizer treatments.  Patient will also continue with tracheostomy care.  #2 acute exacerbation of asthma: We will continue care according to above.  She has severe persistent asthma.  Continue with her long-acting bronchodilators.  #3 possible pneumonia: On x-ray suggestive of right infrahilar pneumonia.  Patient will be  empirically on Rocephin and Zithromax while waiting for any cultures and sensitivity results.  #4 morbid obesity: Counseling will continue be provided.  #5 tracheostomy dependence: We will continue with in-house tracheostomy care.  #6 anxiety disorder: Patient very anxious.  Counseling been provided.  Continue with her  home regimen.  #7 diabetes: In the setting of steroids continue home regimen but add sliding scale insulin.  May increase long-acting steroids.  #8 hypertension: Blood pressure at this point is slightly elevated.  Resume home regimen and adjust accordingly.   DVT prophylaxis: Heparin Code Status: Full code Family Communication: No family at the bedside Disposition Plan: To be determined Consults called: None Admission status: Inpatient  Severity of Illness: The appropriate patient status for this patient is INPATIENT. Inpatient status is judged to be reasonable and necessary in order to provide the required intensity of service to ensure the patient's safety. The patient's presenting symptoms, physical exam findings, and initial radiographic and laboratory data in the context of their chronic comorbidities is felt to place them at high risk for further clinical deterioration. Furthermore, it is not anticipated that the patient will be medically stable for discharge from the hospital within 2 midnights of admission. The following factors support the patient status of inpatient.   " The patient's presenting symptoms include shortness of breath and cough. " The worrisome physical exam findings include marked expiratory wheezing with crackles. " The initial radiographic and laboratory data are worrisome because of possible pneumonia on chest x-ray. " The chronic co-morbidities include persistent asthma.   * I certify that at the point of admission it is my clinical judgment that the patient will require inpatient hospital care spanning beyond 2 midnights from the point of  admission due to high intensity of service, high risk for further deterioration and high frequency of surveillance required.Barbette Merino MD [facility] Pager [phone]  If 7PM-7AM, please contact night-coverage [email or web address] Password [email or web address]  10/23/2018, 11:00 PM

## 2018-10-23 NOTE — ED Notes (Signed)
Portable XR at bedside

## 2018-10-24 ENCOUNTER — Other Ambulatory Visit: Payer: Self-pay

## 2018-10-24 ENCOUNTER — Encounter (HOSPITAL_COMMUNITY): Payer: Self-pay

## 2018-10-24 ENCOUNTER — Encounter (INDEPENDENT_AMBULATORY_CARE_PROVIDER_SITE_OTHER): Payer: Self-pay | Admitting: Orthopedic Surgery

## 2018-10-24 DIAGNOSIS — R0602 Shortness of breath: Secondary | ICD-10-CM | POA: Diagnosis not present

## 2018-10-24 DIAGNOSIS — J9602 Acute respiratory failure with hypercapnia: Secondary | ICD-10-CM

## 2018-10-24 DIAGNOSIS — J9601 Acute respiratory failure with hypoxia: Secondary | ICD-10-CM

## 2018-10-24 DIAGNOSIS — J189 Pneumonia, unspecified organism: Secondary | ICD-10-CM | POA: Diagnosis not present

## 2018-10-24 DIAGNOSIS — Z930 Tracheostomy status: Secondary | ICD-10-CM | POA: Diagnosis not present

## 2018-10-24 DIAGNOSIS — E1142 Type 2 diabetes mellitus with diabetic polyneuropathy: Secondary | ICD-10-CM | POA: Diagnosis not present

## 2018-10-24 DIAGNOSIS — M25511 Pain in right shoulder: Secondary | ICD-10-CM | POA: Diagnosis not present

## 2018-10-24 DIAGNOSIS — I110 Hypertensive heart disease with heart failure: Secondary | ICD-10-CM | POA: Diagnosis not present

## 2018-10-24 DIAGNOSIS — J45901 Unspecified asthma with (acute) exacerbation: Secondary | ICD-10-CM | POA: Diagnosis not present

## 2018-10-24 DIAGNOSIS — I5032 Chronic diastolic (congestive) heart failure: Secondary | ICD-10-CM | POA: Diagnosis not present

## 2018-10-24 DIAGNOSIS — Z6841 Body Mass Index (BMI) 40.0 and over, adult: Secondary | ICD-10-CM | POA: Diagnosis not present

## 2018-10-24 DIAGNOSIS — J4551 Severe persistent asthma with (acute) exacerbation: Secondary | ICD-10-CM | POA: Diagnosis not present

## 2018-10-24 DIAGNOSIS — J9621 Acute and chronic respiratory failure with hypoxia: Secondary | ICD-10-CM | POA: Diagnosis not present

## 2018-10-24 DIAGNOSIS — J9622 Acute and chronic respiratory failure with hypercapnia: Secondary | ICD-10-CM | POA: Diagnosis not present

## 2018-10-24 DIAGNOSIS — I10 Essential (primary) hypertension: Secondary | ICD-10-CM | POA: Diagnosis not present

## 2018-10-24 DIAGNOSIS — Y92230 Patient room in hospital as the place of occurrence of the external cause: Secondary | ICD-10-CM | POA: Diagnosis not present

## 2018-10-24 DIAGNOSIS — N179 Acute kidney failure, unspecified: Secondary | ICD-10-CM | POA: Diagnosis not present

## 2018-10-24 DIAGNOSIS — G4733 Obstructive sleep apnea (adult) (pediatric): Secondary | ICD-10-CM | POA: Diagnosis not present

## 2018-10-24 DIAGNOSIS — J181 Lobar pneumonia, unspecified organism: Secondary | ICD-10-CM | POA: Diagnosis not present

## 2018-10-24 LAB — CBC
HEMATOCRIT: 37.7 % (ref 36.0–46.0)
Hemoglobin: 12.1 g/dL (ref 12.0–15.0)
MCH: 27.5 pg (ref 26.0–34.0)
MCHC: 32.1 g/dL (ref 30.0–36.0)
MCV: 85.7 fL (ref 80.0–100.0)
Platelets: 365 10*3/uL (ref 150–400)
RBC: 4.4 MIL/uL (ref 3.87–5.11)
RDW: 13.4 % (ref 11.5–15.5)
WBC: 9 10*3/uL (ref 4.0–10.5)
nRBC: 0 % (ref 0.0–0.2)

## 2018-10-24 LAB — GLUCOSE, CAPILLARY
Glucose-Capillary: 381 mg/dL — ABNORMAL HIGH (ref 70–99)
Glucose-Capillary: 355 mg/dL — ABNORMAL HIGH (ref 70–99)
Glucose-Capillary: 341 mg/dL — ABNORMAL HIGH (ref 70–99)
Glucose-Capillary: 360 mg/dL — ABNORMAL HIGH (ref 70–99)
Glucose-Capillary: 346 mg/dL — ABNORMAL HIGH (ref 70–99)

## 2018-10-24 LAB — COMPREHENSIVE METABOLIC PANEL
ALT: 30 U/L (ref 0–44)
AST: 21 U/L (ref 15–41)
Albumin: 3.5 g/dL (ref 3.5–5.0)
Alkaline Phosphatase: 107 U/L (ref 38–126)
Anion gap: 11 (ref 5–15)
BUN: 14 mg/dL (ref 6–20)
CO2: 21 mmol/L — ABNORMAL LOW (ref 22–32)
Calcium: 8.9 mg/dL (ref 8.9–10.3)
Chloride: 104 mmol/L (ref 98–111)
Creatinine, Ser: 1.27 mg/dL — ABNORMAL HIGH (ref 0.44–1.00)
GFR, EST AFRICAN AMERICAN: 57 mL/min — AB (ref >60)
GFR, EST NON AFRICAN AMERICAN: 50 mL/min — AB (ref >60)
Glucose, Bld: 391 mg/dL — ABNORMAL HIGH (ref 70–99)
Potassium: 4.4 mmol/L (ref 3.5–5.1)
Sodium: 136 mmol/L (ref 135–145)
Total Bilirubin: 0.4 mg/dL (ref 0.3–1.2)
Total Protein: 8.1 g/dL (ref 6.5–8.1)

## 2018-10-24 LAB — MRSA PCR SCREENING: MRSA by PCR: NEGATIVE

## 2018-10-24 LAB — HIV ANTIBODY (ROUTINE TESTING W REFLEX): HIV Screen 4th Generation wRfx: NONREACTIVE

## 2018-10-24 LAB — STREP PNEUMONIAE URINARY ANTIGEN: STREP PNEUMO URINARY ANTIGEN: NEGATIVE

## 2018-10-24 LAB — PROCALCITONIN: Procalcitonin: <0.1 ng/mL

## 2018-10-24 MED ORDER — BUPROPION HCL ER (SR) 150 MG PO TB12
150.0000 mg | ORAL_TABLET | Freq: Two times a day (BID) | ORAL | Status: DC
  Administered 2018-10-24 – 2018-10-29 (×11): 150 mg via ORAL
  Filled 2018-10-24 (×11): qty 1

## 2018-10-24 MED ORDER — HYDROMORPHONE HCL 1 MG/ML IJ SOLN: 0.5000 mg | INTRAMUSCULAR | Status: DC | PRN

## 2018-10-24 MED ORDER — TIZANIDINE HCL 4 MG PO TABS
4.0000 mg | ORAL_TABLET | Freq: Three times a day (TID) | ORAL | Status: DC | PRN
  Administered 2018-10-28 – 2018-10-29 (×3): 4 mg via ORAL
  Filled 2018-10-24 (×3): qty 1

## 2018-10-24 MED ORDER — TOPIRAMATE 25 MG PO TABS: 100.0000 mg | ORAL_TABLET | Freq: Two times a day (BID) | ORAL | Status: DC

## 2018-10-24 MED ORDER — INSULIN ASPART 100 UNIT/ML ~~LOC~~ SOLN
0.0000 [IU] | Freq: Three times a day (TID) | SUBCUTANEOUS | Status: DC
  Administered 2018-10-24 (×2): 20 [IU] via SUBCUTANEOUS
  Administered 2018-10-24 – 2018-10-25 (×2): 15 [IU] via SUBCUTANEOUS
  Administered 2018-10-25: 20 [IU] via SUBCUTANEOUS
  Administered 2018-10-25: 15 [IU] via SUBCUTANEOUS
  Administered 2018-10-26 (×2): 11 [IU] via SUBCUTANEOUS
  Administered 2018-10-26: 7 [IU] via SUBCUTANEOUS
  Administered 2018-10-27: 4 [IU] via SUBCUTANEOUS
  Administered 2018-10-27: 17 [IU] via SUBCUTANEOUS
  Administered 2018-10-27 – 2018-10-28 (×2): 3 [IU] via SUBCUTANEOUS
  Administered 2018-10-28 (×2): 11 [IU] via SUBCUTANEOUS
  Administered 2018-10-29: 3 [IU] via SUBCUTANEOUS

## 2018-10-24 MED ORDER — INSULIN PEN NEEDLE 31G X 8 MM MISC: 1.0000 [IU] | Freq: Every day | Status: DC

## 2018-10-24 MED ORDER — ATORVASTATIN CALCIUM 10 MG PO TABS
20.0000 mg | ORAL_TABLET | Freq: Every day | ORAL | Status: DC
  Administered 2018-10-24 – 2018-10-29 (×6): 20 mg via ORAL
  Filled 2018-10-24 (×7): qty 2

## 2018-10-24 MED ORDER — SUMATRIPTAN SUCCINATE 100 MG PO TABS
100.0000 mg | ORAL_TABLET | ORAL | Status: DC | PRN
  Administered 2018-10-26: 100 mg via ORAL
  Filled 2018-10-24 (×2): qty 1

## 2018-10-24 MED ORDER — NAPROXEN 250 MG PO TABS
500.0000 mg | ORAL_TABLET | Freq: Two times a day (BID) | ORAL | Status: DC | PRN
  Administered 2018-10-24 (×2): 500 mg via ORAL
  Filled 2018-10-24 (×3): qty 2

## 2018-10-24 MED ORDER — MONTELUKAST SODIUM 10 MG PO TABS
10.0000 mg | ORAL_TABLET | Freq: Every day | ORAL | Status: DC
  Administered 2018-10-24 – 2018-10-28 (×6): 10 mg via ORAL
  Filled 2018-10-24 (×6): qty 1

## 2018-10-24 MED ORDER — SODIUM CHLORIDE 0.9 % IV SOLN
INTRAVENOUS | Status: DC
  Administered 2018-10-24 – 2018-10-26 (×3): via INTRAVENOUS

## 2018-10-24 MED ORDER — MOMETASONE FURO-FORMOTEROL FUM 200-5 MCG/ACT IN AERO
2.0000 | INHALATION_SPRAY | Freq: Two times a day (BID) | RESPIRATORY_TRACT | Status: DC
  Administered 2018-10-24 – 2018-10-29 (×11): 2 via RESPIRATORY_TRACT
  Filled 2018-10-24: qty 8.8

## 2018-10-24 MED ORDER — LISINOPRIL 5 MG PO TABS
5.0000 mg | ORAL_TABLET | Freq: Every day | ORAL | Status: DC
  Administered 2018-10-24 – 2018-10-29 (×6): 5 mg via ORAL
  Filled 2018-10-24 (×6): qty 1

## 2018-10-24 MED ORDER — HYDROMORPHONE HCL 1 MG/ML IJ SOLN
1.0000 mg | INTRAMUSCULAR | Status: DC | PRN
  Administered 2018-10-24 – 2018-10-28 (×16): 1 mg via INTRAVENOUS
  Filled 2018-10-24 (×18): qty 1

## 2018-10-24 MED ORDER — SODIUM CHLORIDE 0.9 % IV SOLN
1.0000 g | INTRAVENOUS | Status: DC
  Administered 2018-10-24 – 2018-10-26 (×3): 1 g via INTRAVENOUS
  Filled 2018-10-24: qty 10
  Filled 2018-10-24: qty 1
  Filled 2018-10-24: qty 10

## 2018-10-24 MED ORDER — HEPARIN SODIUM (PORCINE) 5000 UNIT/ML IJ SOLN
5000.0000 [IU] | Freq: Three times a day (TID) | INTRAMUSCULAR | Status: DC
  Administered 2018-10-24 – 2018-10-29 (×16): 5000 [IU] via SUBCUTANEOUS
  Filled 2018-10-24 (×16): qty 1

## 2018-10-24 MED ORDER — SODIUM CHLORIDE 3 % IN NEBU
4.0000 mL | INHALATION_SOLUTION | Freq: Two times a day (BID) | RESPIRATORY_TRACT | Status: AC
  Administered 2018-10-24 – 2018-10-26 (×5): 4 mL via RESPIRATORY_TRACT
  Filled 2018-10-24 (×6): qty 4

## 2018-10-24 MED ORDER — SODIUM CHLORIDE 0.9 % IV SOLN
500.0000 mg | INTRAVENOUS | Status: DC
  Administered 2018-10-24: 500 mg via INTRAVENOUS
  Filled 2018-10-24 (×2): qty 500

## 2018-10-24 MED ORDER — ARTIFICIAL TEARS OPHTHALMIC OINT
1.0000 "application " | TOPICAL_OINTMENT | Freq: Every day | OPHTHALMIC | Status: DC
  Filled 2018-10-24: qty 3.5

## 2018-10-24 MED ORDER — INSULIN ASPART 100 UNIT/ML ~~LOC~~ SOLN
0.0000 [IU] | Freq: Every day | SUBCUTANEOUS | Status: DC
  Administered 2018-10-24: 5 [IU] via SUBCUTANEOUS
  Administered 2018-10-24: 4 [IU] via SUBCUTANEOUS
  Administered 2018-10-25: 3 [IU] via SUBCUTANEOUS
  Administered 2018-10-26: 4 [IU] via SUBCUTANEOUS
  Administered 2018-10-27: 2 [IU] via SUBCUTANEOUS

## 2018-10-24 MED ORDER — GABAPENTIN 300 MG PO CAPS
600.0000 mg | ORAL_CAPSULE | Freq: Two times a day (BID) | ORAL | Status: DC
  Administered 2018-10-24 (×2): 600 mg via ORAL
  Filled 2018-10-24 (×2): qty 2

## 2018-10-24 MED ORDER — IPRATROPIUM-ALBUTEROL 0.5-2.5 (3) MG/3ML IN SOLN
3.0000 mL | Freq: Four times a day (QID) | RESPIRATORY_TRACT | Status: DC
  Administered 2018-10-24 – 2018-10-25 (×6): 3 mL via RESPIRATORY_TRACT
  Filled 2018-10-24 (×6): qty 3

## 2018-10-24 MED ORDER — OXYCODONE HCL 5 MG PO TABS
5.0000 mg | ORAL_TABLET | Freq: Four times a day (QID) | ORAL | Status: DC | PRN
  Administered 2018-10-24 – 2018-10-29 (×12): 5 mg via ORAL
  Filled 2018-10-24 (×13): qty 1

## 2018-10-24 MED ORDER — MELATONIN 3 MG PO TABS
3.0000 mg | ORAL_TABLET | Freq: Every evening | ORAL | Status: DC | PRN
  Filled 2018-10-24: qty 1

## 2018-10-24 MED ORDER — SENNOSIDES-DOCUSATE SODIUM 8.6-50 MG PO TABS
2.0000 | ORAL_TABLET | Freq: Two times a day (BID) | ORAL | Status: DC
  Administered 2018-10-24 – 2018-10-27 (×3): 2 via ORAL
  Filled 2018-10-24 (×7): qty 2

## 2018-10-24 MED ORDER — HYDROXYZINE HCL 25 MG PO TABS: 25.0000 mg | ORAL_TABLET | Freq: Three times a day (TID) | ORAL | Status: DC | PRN

## 2018-10-24 MED ORDER — AMLODIPINE BESYLATE 10 MG PO TABS
10.0000 mg | ORAL_TABLET | Freq: Every day | ORAL | Status: DC
  Administered 2018-10-24 – 2018-10-29 (×6): 10 mg via ORAL
  Filled 2018-10-24 (×6): qty 1

## 2018-10-24 MED ORDER — CARVEDILOL 25 MG PO TABS
25.0000 mg | ORAL_TABLET | Freq: Two times a day (BID) | ORAL | Status: DC
  Administered 2018-10-24 – 2018-10-29 (×11): 25 mg via ORAL
  Filled 2018-10-24 (×11): qty 1

## 2018-10-24 MED ORDER — SERTRALINE HCL 100 MG PO TABS
100.0000 mg | ORAL_TABLET | Freq: Every day | ORAL | Status: DC
  Administered 2018-10-24 – 2018-10-29 (×6): 100 mg via ORAL
  Filled 2018-10-24 (×6): qty 1

## 2018-10-24 MED ORDER — METHYLPREDNISOLONE SODIUM SUCC 125 MG IJ SOLR
125.0000 mg | Freq: Four times a day (QID) | INTRAMUSCULAR | Status: DC
  Administered 2018-10-24 – 2018-10-25 (×7): 125 mg via INTRAVENOUS
  Filled 2018-10-24 (×7): qty 2

## 2018-10-24 MED ORDER — ALBUTEROL SULFATE HFA 108 (90 BASE) MCG/ACT IN AERS: 2.0000 | INHALATION_SPRAY | Freq: Four times a day (QID) | RESPIRATORY_TRACT | Status: DC | PRN

## 2018-10-24 MED ORDER — ALBUTEROL SULFATE (2.5 MG/3ML) 0.083% IN NEBU
2.5000 mg | INHALATION_SOLUTION | Freq: Four times a day (QID) | RESPIRATORY_TRACT | Status: DC | PRN
  Administered 2018-10-28: 2.5 mg via RESPIRATORY_TRACT
  Filled 2018-10-24: qty 3

## 2018-10-24 MED ORDER — GABAPENTIN 300 MG PO CAPS
300.0000 mg | ORAL_CAPSULE | Freq: Two times a day (BID) | ORAL | Status: DC
  Administered 2018-10-24 – 2018-10-29 (×10): 300 mg via ORAL
  Filled 2018-10-24 (×10): qty 1

## 2018-10-24 MED ORDER — FUROSEMIDE 20 MG PO TABS
20.0000 mg | ORAL_TABLET | Freq: Every day | ORAL | Status: DC
  Administered 2018-10-24 – 2018-10-25 (×2): 20 mg via ORAL
  Filled 2018-10-24 (×2): qty 1

## 2018-10-24 MED ORDER — BENZONATATE 100 MG PO CAPS
200.0000 mg | ORAL_CAPSULE | Freq: Three times a day (TID) | ORAL | Status: DC | PRN
  Administered 2018-10-24 – 2018-10-28 (×11): 200 mg via ORAL
  Filled 2018-10-24 (×12): qty 2

## 2018-10-24 MED ORDER — INSULIN ASPART 100 UNIT/ML ~~LOC~~ SOLN
10.0000 [IU] | Freq: Three times a day (TID) | SUBCUTANEOUS | Status: DC
  Administered 2018-10-24 – 2018-10-25 (×3): 10 [IU] via SUBCUTANEOUS

## 2018-10-24 MED ORDER — INSULIN GLARGINE 100 UNIT/ML ~~LOC~~ SOLN
130.0000 [IU] | Freq: Every day | SUBCUTANEOUS | Status: DC
  Administered 2018-10-24 – 2018-10-29 (×6): 130 [IU] via SUBCUTANEOUS
  Filled 2018-10-24 (×7): qty 1.3

## 2018-10-24 NOTE — Progress Notes (Signed)
Pt received from ED, AO x4. Has Trech, RT by bedside. Coughing, 5l o2 via treach collar applied. CHG bath completed. Connected to monitor. CCMD notified, oriented pt to call bell system and room. Call bell within reach, will continue to monitor.
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
## 2018-10-24 NOTE — Progress Notes (Signed)
PROGRESS NOTE  [name] QVZ:[id number] DOB: 08-05-1971 DOA: 10/23/2018 PCP: [name], MD  HPI/Recap of past 24 hours: [name] is a 48 y.o. female with medical history significant of tracheal stenosis post tracheostomy, asthma, morbid obesity, type 2 diabetes, hypertension, GERD, and chronic pain syndrome who came to the ER with progressive shortness of breath, cough, and significant anxiety. Onset 2 days prior. Denied any nausea or vomiting.  + Chest discomfort from coughing.  X-ray suggested possible pneumonia. Admitted for acute on chronic hypoxic respiratory failure secondary to CAP.  10/24/18: Patient seen and examined at her bedside. Reports persistent cough with tinged blood in her sputum. Also reports discomfort from coughing. Has no other complaints.  Assessment/Plan: Principal Problem:   Acute respiratory failure with hypoxia and hypercapnia (HCC) Active Problems:   Sleep apnea, obstructive   Essential hypertension   Type 2 diabetes mellitus with diabetic polyneuropathy, without long-term current use of insulin (HCC)   Morbid obesity (HCC)   Tracheostomy dependent (HCC)   Asthma, severe persistent  Acute asthma exacerbation suspect secondary to early CAP Independently reviewed chest x-ray and CT chest which revealed possible infiltrate versus atelectasis at right lower lobe Continue IV azithromycin and Rocephin Continue breathing treatment around-the-clock Continue IV steroids Monitor fever curve and WBC Procalcitonin less than 0.10 Obtain CBC in the morning Obtain respiratory viral panel and influenza A&B PCR Maintain O2 saturation greater than 92%  Acute on chronic hypoxic respiratory Continue with tracheostomy care Maintain O2 saturation written 92%  Suspected early CAP Management as stated above Monitor fever curve and WBC Obtain CBC in the morning  Resolving AKI Baseline creatinine is 1.0 with GFR greater than  60 Presented with creatinine of 1.31 Creatinine is improving to 1.27 Continue to avoid nephrotoxic agents Cutdown IV fluid hydration from 100 cc/h of normal saline to 50 cc/h normal saline Monitor urine output Repeat BMP in the morning  Morbid obesity BMI 47 Recommend weight loss outpatient  Tracheostomy dependence: We will continue with in-house tracheostomy care.  Anxiety disorder: Patient very anxious.  Counseling been provided.  Continue with her home regimen.  Type 2 diabetes:  Latest hemoglobin A1c 12.5 on 07/30/2018 In the setting of steroids continue home regimen Continue sliding scale insulin.    Hypertension: Blood pressure at this point is slightly elevated.   Resume home regimen and adjust accordingly  Chronic diastolic CHF No acute issues Hold off Lasix 20 mg daily.  On gentle IV fluid hydration normal saline at 50 cc/h.  DC IV fluid with proper oral intake. Currently on lisinopril but creatinine is trending down Hold off lisinopril if creatinine trends up Latest 2D echo done on 06/04/2017 revealed LVEF 6065% with grade 1 diastolic dysfunction Start strict I's and O's and daily weight  DVT prophylaxis:  Subcu Heparin 3 times daily Code Status: Full code Family Communication:  None at bedside Disposition Plan:  DC to previous environment in 1 to 2 days when clinically stable. Consults called: None    Objective: Vitals:   10/24/18 0030 10/24/18 0118 10/24/18 0453 10/24/18 0840  BP: (!) 158/94  (!) 159/95   Pulse: 94 (!) 115 80 84  Resp: 20 (!) 26 19 17   Temp: 98.4 F (36.9 C)  98.3 F (36.8 C)   TempSrc: Oral  Oral   SpO2: 98% 95% 95% 97%  Weight: 113.5 kg     Height: 5\' 1"  (1.549 m)       Intake/Output Summary (Last 24 hours) at 10/24/2018  [place] filed at 10/24/2018 0300 Gross per 24 hour  Intake 1153.39 ml  Output 700 ml  Net 453.39 ml   Filed Weights   10/24/18 0030  Weight: 113.5 kg    Exam:  . General: 48 y.o. year-old female  well developed well nourished in no acute distress.  Alert and oriented x3. . Cardiovascular: Regular rate and rhythm with no rubs or gallops.  No thyromegaly or JVD noted.   Marland Kitchen Respiratory: Diffuse expiratory wheezes.  Good inspiratory effort.  Tracheostomy. . Abdomen: Soft nontender nondistended with normal bowel sounds x4 quadrants. . Musculoskeletal: Trace lower extremity edema. 2/4 pulses in all 4 extremities. Marland Kitchen Psychiatry: Mood is appropriate for condition and setting   Data Reviewed: CBC: Recent Labs  Lab 10/23/18 1630 10/24/18 0115  WBC 11.6* 9.0  HGB 12.2 12.1  HCT 38.8 37.7  MCV 87.4 85.7  PLT 349 607   Basic Metabolic Panel: Recent Labs  Lab 10/23/18 1630 10/24/18 0115  NA 137 136  K 4.0 4.4  CL 103 104  CO2 20* 21*  GLUCOSE 330* 391*  BUN 16 14  CREATININE 1.31* 1.27*  CALCIUM 9.0 8.9   GFR: Estimated Creatinine Clearance: 62.7 mL/min (A) (by C-G formula based on SCr of 1.27 mg/dL (H)). Liver Function Tests: Recent Labs  Lab 10/24/18 0115  AST 21  ALT 30  ALKPHOS 107  BILITOT 0.4  PROT 8.1  ALBUMIN 3.5   No results for input(s): LIPASE, AMYLASE in the last 168 hours. No results for input(s): AMMONIA in the last 168 hours. Coagulation Profile: No results for input(s): INR, PROTIME in the last 168 hours. Cardiac Enzymes: Recent Labs  Lab 10/23/18 1630  TROPONINI <0.03   BNP (last 3 results) No results for input(s): PROBNP in the last 8760 hours. HbA1C: No results for input(s): HGBA1C in the last 72 hours. CBG: Recent Labs  Lab 10/24/18 0133 10/24/18 0615  GLUCAP 381* 355*   Lipid Profile: No results for input(s): CHOL, HDL, LDLCALC, TRIG, CHOLHDL, LDLDIRECT in the last 72 hours. Thyroid Function Tests: No results for input(s): TSH, T4TOTAL, FREET4, T3FREE, THYROIDAB in the last 72 hours. Anemia Panel: No results for input(s): VITAMINB12, FOLATE, FERRITIN, TIBC, IRON, RETICCTPCT in the last 72 hours. Urine analysis:    Component  Value Date/Time   COLORURINE STRAW (A) 03/06/2018 1212   APPEARANCEUR CLEAR 03/06/2018 1212   LABSPEC 1.030 03/06/2018 1212   PHURINE 6.0 03/06/2018 1212   GLUCOSEU >=500 (A) 03/06/2018 1212   HGBUR NEGATIVE 03/06/2018 1212   BILIRUBINUR neg 04/12/2018 1006   KETONESUR NEGATIVE 03/06/2018 1212   PROTEINUR Positive (A) 04/12/2018 1006   PROTEINUR NEGATIVE 03/06/2018 1212   UROBILINOGEN 0.2 04/12/2018 1006   UROBILINOGEN 0.2 11/18/2014 2325   NITRITE neg 04/12/2018 1006   NITRITE NEGATIVE 03/06/2018 1212   LEUKOCYTESUR Negative 04/12/2018 1006   Sepsis Labs: @LABRCNTIP (procalcitonin:4,lacticidven:4)  ) Recent Results (from the past 240 hour(s))  Culture, blood (routine x 2) Call MD if unable to obtain prior to antibiotics being given     Status: None (Preliminary result)   Collection Time: 10/24/18  1:03 AM  Result Value Ref Range Status   Specimen Description BLOOD LEFT ANTECUBITAL  Final   Special Requests   Final    BOTTLES DRAWN AEROBIC AND ANAEROBIC Blood Culture adequate volume   Culture NO GROWTH < 12 HOURS  Final   Report Status PENDING  Incomplete  Culture, blood (routine x 2) Call MD if unable to obtain prior to  antibiotics being given     Status: None (Preliminary result)   Collection Time: 10/24/18  1:03 AM  Result Value Ref Range Status   Specimen Description BLOOD LEFT ANTECUBITAL  Final   Special Requests   Final    BOTTLES DRAWN AEROBIC AND ANAEROBIC Blood Culture adequate volume   Culture NO GROWTH < 12 HOURS  Final   Report Status PENDING  Incomplete      Studies: Ct Angio Chest Pe W Or Wo Contrast  Result Date: 10/23/2018 CLINICAL DATA:  Shortness of breath. Bloody mucus via trach. Cough. EXAM: CT ANGIOGRAPHY CHEST WITH CONTRAST TECHNIQUE: Multidetector CT imaging of the chest was performed using the standard protocol during bolus administration of intravenous contrast. Multiplanar CT image reconstructions and MIPs were obtained to evaluate the vascular  anatomy. CONTRAST:  169mL ISOVUE-370 IOPAMIDOL (ISOVUE-370) INJECTION 76% COMPARISON:  03/05/2018 FINDINGS: Cardiovascular: Heart is enlarged. No substantial pericardial effusion. Ascending thoracic aorta measures 3.9 cm maximum dimension. The no filling defect identified in the opacified pulmonary arteries to suggest the presence of an acute pulmonary embolus. Mediastinum/Nodes: 1.8 cm short axis right paratracheal lymph node is stable in the interval. Upper normal lymph nodes are seen in the right hilum. No left hilar lymphadenopathy. The esophagus has normal imaging features. There is no axillary lymphadenopathy. Lungs/Pleura: Tracheostomy tube again noted. Tip is positioned in the proximal to mid trachea. No focal airspace consolidation. No suspicious nodule or mass. No pleural effusion. Upper Abdomen: The liver shows diffusely decreased attenuation suggesting steatosis. Otherwise unremarkable. Musculoskeletal: No worrisome lytic or sclerotic osseous abnormality. Review of the MIP images confirms the above findings. IMPRESSION: 1. No CT evidence for acute pulmonary embolus. 2. Stable cardiomegaly. 3. Hepatic steatosis Electronically Signed   By: Misty Stanley M.D.   On: 10/23/2018 21:40   Dg Chest Port 1 View  Result Date: 10/23/2018 CLINICAL DATA:  Shortness of breath and cough EXAM: PORTABLE CHEST 1 VIEW COMPARISON:  03/05/2018 FINDINGS: Tracheostomy tube is in place. Mild cardiomegaly. Hazy right infrahilar atelectasis or developing infiltrate. No pneumothorax. Probable small right effusion. IMPRESSION: 1. Hazy right infrahilar atelectasis or developing infiltrate. Probable small right effusion. 2. Mild cardiomegaly Electronically Signed   By: Donavan Foil M.D.   On: 10/23/2018 16:59    Scheduled Meds: . amLODipine  10 mg Oral Daily  . artificial tears  1 application Both Eyes QHS  . atorvastatin  20 mg Oral Daily  . buPROPion  150 mg Oral BID  . carvedilol  25 mg Oral BID WC  . furosemide  20 mg  Oral Daily  . gabapentin  600 mg Oral BID  . heparin  5,000 Units Subcutaneous Q8H  . insulin aspart  0-20 Units Subcutaneous TID WC  . insulin aspart  0-5 Units Subcutaneous QHS  . insulin glargine  130 Units Subcutaneous Daily  . ipratropium-albuterol  3 mL Nebulization Q6H  . lisinopril  5 mg Oral Daily  . methylPREDNISolone (SOLU-MEDROL) injection  125 mg Intravenous Q6H  . mometasone-formoterol  2 puff Inhalation BID  . montelukast  10 mg Oral QHS  . sertraline  100 mg Oral Daily  . sodium chloride HYPERTONIC  4 mL Nebulization BID    Continuous Infusions: . sodium chloride 100 mL/hr at 10/24/18 0206  . azithromycin    . cefTRIAXone (ROCEPHIN)  IV       LOS: 1 day     Kayleen Memos, MD [facility] Pager [phone]  If 7PM-7AM, please contact night-coverage [email or web address] Password [email or web address]  10/24/2018, 10:44 AM

## 2018-10-25 ENCOUNTER — Ambulatory Visit: Payer: Medicare Other | Admitting: Physical Medicine & Rehabilitation

## 2018-10-25 DIAGNOSIS — J189 Pneumonia, unspecified organism: Secondary | ICD-10-CM | POA: Diagnosis not present

## 2018-10-25 DIAGNOSIS — J45901 Unspecified asthma with (acute) exacerbation: Secondary | ICD-10-CM | POA: Diagnosis not present

## 2018-10-25 DIAGNOSIS — R0602 Shortness of breath: Secondary | ICD-10-CM | POA: Diagnosis not present

## 2018-10-25 DIAGNOSIS — I10 Essential (primary) hypertension: Secondary | ICD-10-CM | POA: Diagnosis not present

## 2018-10-25 DIAGNOSIS — J181 Lobar pneumonia, unspecified organism: Secondary | ICD-10-CM | POA: Diagnosis not present

## 2018-10-25 DIAGNOSIS — E1142 Type 2 diabetes mellitus with diabetic polyneuropathy: Secondary | ICD-10-CM | POA: Diagnosis not present

## 2018-10-25 DIAGNOSIS — J9601 Acute respiratory failure with hypoxia: Secondary | ICD-10-CM | POA: Diagnosis not present

## 2018-10-25 DIAGNOSIS — J9602 Acute respiratory failure with hypercapnia: Secondary | ICD-10-CM | POA: Diagnosis not present

## 2018-10-25 LAB — RESPIRATORY PANEL BY PCR
Adenovirus: NOT DETECTED
Bordetella pertussis: NOT DETECTED
CORONAVIRUS OC43-RVPPCR: NOT DETECTED
Chlamydophila pneumoniae: NOT DETECTED
Coronavirus 229E: NOT DETECTED
Coronavirus HKU1: NOT DETECTED
Coronavirus NL63: NOT DETECTED
Influenza A: NOT DETECTED
Influenza B: NOT DETECTED
Metapneumovirus: NOT DETECTED
Mycoplasma pneumoniae: NOT DETECTED
PARAINFLUENZA VIRUS 1-RVPPCR: NOT DETECTED
Parainfluenza Virus 2: NOT DETECTED
Parainfluenza Virus 3: NOT DETECTED
Parainfluenza Virus 4: NOT DETECTED
Respiratory Syncytial Virus: NOT DETECTED
Rhinovirus / Enterovirus: NOT DETECTED

## 2018-10-25 LAB — CBC
HCT: 36.3 % (ref 36.0–46.0)
HEMOGLOBIN: 11.3 g/dL — AB (ref 12.0–15.0)
MCH: 27.4 pg (ref 26.0–34.0)
MCHC: 31.1 g/dL (ref 30.0–36.0)
MCV: 87.9 fL (ref 80.0–100.0)
Platelets: 351 10*3/uL (ref 150–400)
RBC: 4.13 MIL/uL (ref 3.87–5.11)
RDW: 13.8 % (ref 11.5–15.5)
WBC: 16.1 10*3/uL — ABNORMAL HIGH (ref 4.0–10.5)
nRBC: 0 % (ref 0.0–0.2)

## 2018-10-25 LAB — GLUCOSE, CAPILLARY
Glucose-Capillary: 342 mg/dL — ABNORMAL HIGH (ref 70–99)
Glucose-Capillary: 314 mg/dL — ABNORMAL HIGH (ref 70–99)
Glucose-Capillary: 360 mg/dL — ABNORMAL HIGH (ref 70–99)
Glucose-Capillary: 296 mg/dL — ABNORMAL HIGH (ref 70–99)

## 2018-10-25 LAB — BASIC METABOLIC PANEL
Anion gap: 9 (ref 5–15)
BUN: 25 mg/dL — ABNORMAL HIGH (ref 6–20)
CO2: 23 mmol/L (ref 22–32)
Calcium: 8.3 mg/dL — ABNORMAL LOW (ref 8.9–10.3)
Chloride: 105 mmol/L (ref 98–111)
Creatinine, Ser: 1.24 mg/dL — ABNORMAL HIGH (ref 0.44–1.00)
GFR calc Af Amer: 59 mL/min — ABNORMAL LOW (ref >60)
GFR calc non Af Amer: 51 mL/min — ABNORMAL LOW (ref >60)
Glucose, Bld: 334 mg/dL — ABNORMAL HIGH (ref 70–99)
Potassium: 4.3 mmol/L (ref 3.5–5.1)
Sodium: 137 mmol/L (ref 135–145)

## 2018-10-25 MED ORDER — AZITHROMYCIN 500 MG PO TABS
500.0000 mg | ORAL_TABLET | Freq: Every day | ORAL | Status: DC
  Administered 2018-10-25 – 2018-10-26 (×2): 500 mg via ORAL
  Filled 2018-10-25 (×2): qty 1

## 2018-10-25 MED ORDER — INSULIN ASPART 100 UNIT/ML ~~LOC~~ SOLN
15.0000 [IU] | Freq: Three times a day (TID) | SUBCUTANEOUS | Status: DC
  Administered 2018-10-25 – 2018-10-27 (×6): 15 [IU] via SUBCUTANEOUS

## 2018-10-25 MED ORDER — METHYLPREDNISOLONE SODIUM SUCC 125 MG IJ SOLR
60.0000 mg | Freq: Three times a day (TID) | INTRAMUSCULAR | Status: DC
  Administered 2018-10-25 – 2018-10-27 (×5): 60 mg via INTRAVENOUS
  Filled 2018-10-25 (×6): qty 2

## 2018-10-25 MED ORDER — IPRATROPIUM-ALBUTEROL 0.5-2.5 (3) MG/3ML IN SOLN
3.0000 mL | Freq: Three times a day (TID) | RESPIRATORY_TRACT | Status: DC
  Administered 2018-10-25 – 2018-10-29 (×12): 3 mL via RESPIRATORY_TRACT
  Filled 2018-10-25 (×13): qty 3

## 2018-10-25 NOTE — Progress Notes (Addendum)
PROGRESS NOTE  Emerita Berkemeier Bradley HQI:[id number] DOB: 1971-06-06 DOA: 10/23/2018 PCP: [name], MD  HPI/Recap of past 24 hours: [name] is a 48 y.o. female with medical history significant of tracheal stenosis post tracheostomy, asthma, morbid obesity, type 2 diabetes, hypertension, GERD, and chronic pain syndrome who came to the ER with progressive shortness of breath, cough, and significant anxiety. Onset 2 days prior. Denied any nausea or vomiting.  + Chest discomfort from coughing.  X-ray suggested possible pneumonia. Admitted for acute on chronic hypoxic respiratory failure secondary to CAP.  10/24/18: Patient seen and examined at her bedside. Reports persistent cough with tinged blood in her sputum. Also reports discomfort from coughing. Has no other complaints.  Assessment/Plan: Principal Problem:   Acute respiratory failure with hypoxia and hypercapnia (HCC) Active Problems:   Sleep apnea, obstructive   Essential hypertension   Type 2 diabetes mellitus with diabetic polyneuropathy, without long-term current use of insulin (HCC)   Morbid obesity (HCC)   Tracheostomy dependent (HCC)   Asthma, severe persistent  Acute asthma exacerbation suspect secondary to early CAP Independently reviewed chest x-ray and CT chest which revealed possible infiltrate versus atelectasis at right lower lobe Continue IV azithromycin and Rocephin Continue breathing treatment around-the-clock Continue IV steroids > decrease to 60 mg tid IV Monitor fever curve and WBC Procalcitonin less than 0.10 Respiratory viral panel including flu A/B was negative Blood cx's are negative  Acute on chronic hypoxic respiratory/ chronic trach dependence Continue with tracheostomy care Maintain O2 saturation written 92%  Uncont Type 2 diabetes: due to high dose IV steroids+ infection/ stress of above - ^meal coverage insulin 10 > 15 ac tid - cont lantus 130 qd as at home - cont  max SSI - lower iv steroids significantly 60mg  tid - latest hemoglobin A1c 12.5 on 07/30/2018    Suspected early CAP Management as stated above Monitor fever curve and WBC WBC up w/ IV steroids most likely Obtain CBC in the morning  Resolving AKI Baseline creatinine is 1.0 with GFR greater than 60 Creat 1.3 > 1.24 here DC po lasix for now w/ poor po intake and no vol excess on exam Continue to avoid nephrotoxic agents Cutdown IV fluid hydration 50 cc/h normal saline  Morbid obesity BMI 47 Recommend weight loss outpatient  Tracheostomy dependence: We will continue with in-house tracheostomy care.  Anxiety disorder: Patient very anxious.  Counseling been provided.  Continue with her home regimen.    Hypertension: Blood pressure at this point is slightly elevated.   Resume home regimen and adjust accordingly  Chronic diastolic CHF No acute issues - holding home Lasix 20 mg daily - cont gentle IV fluid hydration normal saline at 50 cc/h Currently on lisinopril but creatinine is trending down Hold off lisinopril if creatinine trends up Latest 2D echo done on 06/04/2017 revealed LVEF 6065% with grade 1 diastolic dysfunction Start strict I's and O's and daily weight  DVT prophylaxis:  Subcu Heparin 3 times daily Code Status: Full code Family Communication:  None at bedside Disposition Plan:  DC to previous environment in 1 to 2 days when clinically stable. Consults called: None  Kelly Splinter MD [employer] pgr [phone]   10/25/2018, 1:33 PM       Objective: Vitals:   10/25/18 0759 10/25/18 0802 10/25/18 1112 10/25/18 1130  BP:      Pulse:  81    Resp:  16    Temp:   98.5 F (36.9 C)  TempSrc:   Oral   SpO2: 95% 95%  95%  Weight:      Height:        Intake/Output Summary (Last 24 hours) at 10/25/2018 1332 Last data filed at 10/25/2018 0800 Gross per 24 hour  Intake 2848.44 ml  Output 850 ml  Net 1998.44 ml   Filed Weights    10/24/18 0030 10/25/18 0710  Weight: 113.5 kg 117.8 kg    Exam:  . General: 48 y.o. year-old female well developed well nourished in no acute distress.  Alert and oriented x3. . Cardiovascular: Regular rate and rhythm with no rubs or gallops.  No thyromegaly or JVD noted.   Marland Kitchen Respiratory: Diffuse insp and expiratory wheezes.  Good inspiratory effort.  Tracheostomy. Lots of raspy coughing. Not in distress . Abdomen: Soft nontender nondistended with normal bowel sounds x4 quadrants. . Musculoskeletal: Trace lower extremity edema. 2/4 pulses in all 4 extremities. Marland Kitchen Psychiatry: Mood is appropriate for condition and setting   Data Reviewed: CBC: Recent Labs  Lab 10/23/18 1630 10/24/18 0115 10/25/18 0222  WBC 11.6* 9.0 16.1*  HGB 12.2 12.1 11.3*  HCT 38.8 37.7 36.3  MCV 87.4 85.7 87.9  PLT 349 365 401   Basic Metabolic Panel: Recent Labs  Lab 10/23/18 1630 10/24/18 0115 10/25/18 0222  NA 137 136 137  K 4.0 4.4 4.3  CL 103 104 105  CO2 20* 21* 23  GLUCOSE 330* 391* 334*  BUN 16 14 25*  CREATININE 1.31* 1.27* 1.24*  CALCIUM 9.0 8.9 8.3*   GFR: Estimated Creatinine Clearance: 65.7 mL/min (A) (by C-G formula based on SCr of 1.24 mg/dL (H)). Liver Function Tests: Recent Labs  Lab 10/24/18 0115  AST 21  ALT 30  ALKPHOS 107  BILITOT 0.4  PROT 8.1  ALBUMIN 3.5   No results for input(s): LIPASE, AMYLASE in the last 168 hours. No results for input(s): AMMONIA in the last 168 hours. Coagulation Profile: No results for input(s): INR, PROTIME in the last 168 hours. Cardiac Enzymes: Recent Labs  Lab 10/23/18 1630  TROPONINI <0.03   BNP (last 3 results) No results for input(s): PROBNP in the last 8760 hours. HbA1C: No results for input(s): HGBA1C in the last 72 hours. CBG: Recent Labs  Lab 10/24/18 1157 10/24/18 1621 10/24/18 2211 10/25/18 0637 10/25/18 1110  GLUCAP 341* 360* 346* 342* 314*   Lipid Profile: No results for input(s): CHOL, HDL, LDLCALC, TRIG,  CHOLHDL, LDLDIRECT in the last 72 hours. Thyroid Function Tests: No results for input(s): TSH, T4TOTAL, FREET4, T3FREE, THYROIDAB in the last 72 hours. Anemia Panel: No results for input(s): VITAMINB12, FOLATE, FERRITIN, TIBC, IRON, RETICCTPCT in the last 72 hours. Urine analysis:    Component Value Date/Time   COLORURINE STRAW (A) 03/06/2018 1212   APPEARANCEUR CLEAR 03/06/2018 1212   LABSPEC 1.030 03/06/2018 1212   PHURINE 6.0 03/06/2018 1212   GLUCOSEU >=500 (A) 03/06/2018 1212   HGBUR NEGATIVE 03/06/2018 1212   BILIRUBINUR neg 04/12/2018 1006   KETONESUR NEGATIVE 03/06/2018 1212   PROTEINUR Positive (A) 04/12/2018 1006   PROTEINUR NEGATIVE 03/06/2018 1212   UROBILINOGEN 0.2 04/12/2018 1006   UROBILINOGEN 0.2 11/18/2014 2325   NITRITE neg 04/12/2018 1006   NITRITE NEGATIVE 03/06/2018 1212   LEUKOCYTESUR Negative 04/12/2018 1006   Sepsis Labs: @LABRCNTIP (procalcitonin:4,lacticidven:4)  ) Recent Results (from the past 240 hour(s))  Culture, blood (routine x 2) Call MD if unable to obtain prior to antibiotics being given     Status: None (Preliminary result)  Collection Time: 10/24/18  1:03 AM  Result Value Ref Range Status   Specimen Description BLOOD LEFT ANTECUBITAL  Final   Special Requests   Final    BOTTLES DRAWN AEROBIC AND ANAEROBIC Blood Culture adequate volume   Culture   Final    NO GROWTH 1 DAY Performed at [facility], [place] [address], [place], [place] [address]    Report Status PENDING  Incomplete  Culture, blood (routine x 2) Call MD if unable to obtain prior to antibiotics being given     Status: None (Preliminary result)   Collection Time: 10/24/18  1:03 AM  Result Value Ref Range Status   Specimen Description BLOOD LEFT ANTECUBITAL  Final   Special Requests   Final    BOTTLES DRAWN AEROBIC AND ANAEROBIC Blood Culture adequate volume   Culture   Final    NO GROWTH 1 DAY Performed at [facility], [place] [address], [place], [place]  [address]    Report Status PENDING  Incomplete  MRSA PCR Screening     Status: None   Collection Time: 10/24/18  7:46 AM  Result Value Ref Range Status   MRSA by PCR NEGATIVE NEGATIVE Final    Comment:        The GeneXpert MRSA Assay (FDA approved for NASAL specimens only), is one component of a comprehensive MRSA colonization surveillance program. It is not intended to diagnose MRSA infection nor to guide or monitor treatment for MRSA infections. Performed at [facility], [place] [address], [place], [place] [address]   Respiratory Panel by PCR     Status: None   Collection Time: 10/24/18  5:27 PM  Result Value Ref Range Status   Adenovirus NOT DETECTED NOT DETECTED Final   Coronavirus 229E NOT DETECTED NOT DETECTED Final   Coronavirus HKU1 NOT DETECTED NOT DETECTED Final   Coronavirus NL63 NOT DETECTED NOT DETECTED Final   Coronavirus OC43 NOT DETECTED NOT DETECTED Final   Metapneumovirus NOT DETECTED NOT DETECTED Final   Rhinovirus / Enterovirus NOT DETECTED NOT DETECTED Final   Influenza A NOT DETECTED NOT DETECTED Final   Influenza B NOT DETECTED NOT DETECTED Final   Parainfluenza Virus 1 NOT DETECTED NOT DETECTED Final   Parainfluenza Virus 2 NOT DETECTED NOT DETECTED Final   Parainfluenza Virus 3 NOT DETECTED NOT DETECTED Final   Parainfluenza Virus 4 NOT DETECTED NOT DETECTED Final   Respiratory Syncytial Virus NOT DETECTED NOT DETECTED Final   Bordetella pertussis NOT DETECTED NOT DETECTED Final   Chlamydophila pneumoniae NOT DETECTED NOT DETECTED Final   Mycoplasma pneumoniae NOT DETECTED NOT DETECTED Final    Comment: Performed at [facility], [place] [address] [place], [place] [address]      Studies: No results found.  Scheduled Meds: . amLODipine  10 mg Oral Daily  . artificial tears  1 application Both Eyes QHS  . atorvastatin  20 mg Oral Daily  . azithromycin  500 mg Oral Daily  . buPROPion  150 mg Oral BID  . carvedilol  25 mg Oral BID WC  .  furosemide  20 mg Oral Daily  . gabapentin  300 mg Oral BID  . heparin  5,000 Units Subcutaneous Q8H  . insulin aspart  0-20 Units Subcutaneous TID WC  . insulin aspart  0-5 Units Subcutaneous QHS  . insulin aspart  10 Units Subcutaneous TID WC  . insulin glargine  130 Units Subcutaneous Daily  . ipratropium-albuterol  3 mL  Nebulization TID  . lisinopril  5 mg Oral Daily  . methylPREDNISolone (SOLU-MEDROL) injection  125 mg Intravenous Q6H  . mometasone-formoterol  2 puff Inhalation BID  . montelukast  10 mg Oral QHS  . senna-docusate  2 tablet Oral BID  . sertraline  100 mg Oral Daily  . sodium chloride HYPERTONIC  4 mL Nebulization BID    Continuous Infusions: . sodium chloride 50 mL/hr at 10/24/18 1305  . cefTRIAXone (ROCEPHIN)  IV 1 g (10/24/18 1759)     LOS: 2 days    If 7PM-7AM, please contact night-coverage [email or web address] Password [email or web address] 10/25/2018, 1:32 PM

## 2018-10-26 DIAGNOSIS — J9601 Acute respiratory failure with hypoxia: Secondary | ICD-10-CM | POA: Diagnosis not present

## 2018-10-26 DIAGNOSIS — R0602 Shortness of breath: Secondary | ICD-10-CM | POA: Diagnosis not present

## 2018-10-26 DIAGNOSIS — I10 Essential (primary) hypertension: Secondary | ICD-10-CM | POA: Diagnosis not present

## 2018-10-26 DIAGNOSIS — J45901 Unspecified asthma with (acute) exacerbation: Secondary | ICD-10-CM | POA: Diagnosis not present

## 2018-10-26 DIAGNOSIS — J189 Pneumonia, unspecified organism: Secondary | ICD-10-CM | POA: Diagnosis not present

## 2018-10-26 DIAGNOSIS — J9602 Acute respiratory failure with hypercapnia: Secondary | ICD-10-CM | POA: Diagnosis not present

## 2018-10-26 DIAGNOSIS — J181 Lobar pneumonia, unspecified organism: Secondary | ICD-10-CM | POA: Diagnosis not present

## 2018-10-26 DIAGNOSIS — E1142 Type 2 diabetes mellitus with diabetic polyneuropathy: Secondary | ICD-10-CM | POA: Diagnosis not present

## 2018-10-26 LAB — GLUCOSE, CAPILLARY
GLUCOSE-CAPILLARY: 264 mg/dL — AB (ref 70–99)
Glucose-Capillary: 221 mg/dL — ABNORMAL HIGH (ref 70–99)
Glucose-Capillary: 267 mg/dL — ABNORMAL HIGH (ref 70–99)
Glucose-Capillary: 321 mg/dL — ABNORMAL HIGH (ref 70–99)

## 2018-10-26 LAB — CBC
HCT: 35.4 % — ABNORMAL LOW (ref 36.0–46.0)
Hemoglobin: 10.8 g/dL — ABNORMAL LOW (ref 12.0–15.0)
MCH: 27.1 pg (ref 26.0–34.0)
MCHC: 30.5 g/dL (ref 30.0–36.0)
MCV: 88.9 fL (ref 80.0–100.0)
Platelets: 361 10*3/uL (ref 150–400)
RBC: 3.98 MIL/uL (ref 3.87–5.11)
RDW: 13.9 % (ref 11.5–15.5)
WBC: 13.9 10*3/uL — ABNORMAL HIGH (ref 4.0–10.5)
nRBC: 0 % (ref 0.0–0.2)

## 2018-10-26 LAB — BASIC METABOLIC PANEL
Anion gap: 10 (ref 5–15)
BUN: 29 mg/dL — ABNORMAL HIGH (ref 6–20)
CO2: 24 mmol/L (ref 22–32)
Calcium: 8.2 mg/dL — ABNORMAL LOW (ref 8.9–10.3)
Chloride: 103 mmol/L (ref 98–111)
Creatinine, Ser: 1.35 mg/dL — ABNORMAL HIGH (ref 0.44–1.00)
GFR calc Af Amer: 53 mL/min — ABNORMAL LOW (ref >60)
GFR calc non Af Amer: 46 mL/min — ABNORMAL LOW (ref >60)
Glucose, Bld: 286 mg/dL — ABNORMAL HIGH (ref 70–99)
Potassium: 4.6 mmol/L (ref 3.5–5.1)
Sodium: 137 mmol/L (ref 135–145)

## 2018-10-26 MED ORDER — ALPRAZOLAM 0.25 MG PO TABS
0.2500 mg | ORAL_TABLET | Freq: Four times a day (QID) | ORAL | Status: DC | PRN
  Administered 2018-10-27 – 2018-10-28 (×2): 0.25 mg via ORAL
  Filled 2018-10-26 (×2): qty 1

## 2018-10-26 NOTE — Care Management Important Message (Signed)
Important Message  Patient Details  Name: [name] MRN: [id number] Date of Birth: 1971-02-02   Medicare Important Message Given:  Yes    [name] Khaleed Holan 10/26/2018, 5:13 PM

## 2018-10-26 NOTE — Consult Note (Signed)
   [facility] CM Inpatient Consult   10/26/2018  [name] Sep 01, 1971 [id number]    Patient screened for potential [facility] Care Management program services.   Went to bedside to speak with [name] about [name] engagement. She pleasantly declined [facility] services. Denies having any concerns with medications, transportation, or community resources.   Accepted [place] [place] Management brochure with contact information to call should she change her mind.   Made inpatient RNCM aware of patient declined [place] Management services.    Marthenia Rolling, MSN-Ed, RN,BSN [facility] Liaison [phone]

## 2018-10-26 NOTE — Progress Notes (Signed)
PROGRESS NOTE  [name] OMB:[id number] DOB: [birth year] DOA: 10/23/2018 PCP: [name], MD  HPI/Recap of past 24 hours: [name] is a 48 y.o. female with medical history significant of tracheal stenosis post tracheostomy, asthma, morbid obesity, type 2 diabetes, hypertension, GERD, and chronic pain syndrome who came to the ER with progressive shortness of breath, cough, and significant anxiety. Onset 2 days prior. Denied any nausea or vomiting.  + Chest discomfort from coughing.  X-ray suggested possible pneumonia. Admitted for acute on chronic hypoxic respiratory failure secondary to CAP.  10/24/18: Patient seen and examined at her bedside. Reports persistent cough with tinged blood in her sputum. Also reports discomfort from coughing. Has no other complaints.  Subjective: frustrated about inability to sleep, however coughing spells are improving, she didn't have one spell today while I was visiting.    Assessment/Plan: Principal Problem:   Asthma, chronic, unspecified asthma severity, with acute exacerbation Active Problems:   Essential hypertension   Type 2 diabetes mellitus with diabetic polyneuropathy, without long-term current use of insulin (HCC)   Acute respiratory failure with hypoxia and hypercapnia ([place])   Community acquired pneumonia   Sleep apnea, obstructive   Morbid obesity ([place])   Tracheostomy dependent ([place])   Asthma, severe persistent  Acute asthma exacerbation suspect secondary to early CAP Chest x-ray and CT chest revealed possible infiltrate versus atelectasis at right lower lobe Continuing IV azithromycin and Rocephin Continue breathing treatments Continue IV steroids > decrease again to 60 mg solumedrol BID Monitor fever curve and WBC Procalcitonin less than 0.10 Respiratory viral panel including flu A/B was negative Blood cx's negative -seems to be improving daily, possible DC soon in 1-2 days on pred taper, nebs,  po abx  Acute on chronic hypoxic respiratory/ chronic trach dependence Continue with tracheostomy care Maintain O2 saturation written 92%  Uncont Type 2 diabetes: due to high dose IV steroids+ infection/ stress of above - BS's down to 200's today from 300's yest - ^meal coverage insulin ^'d yest to 15 ac tid - cont lantus 130 qd as at home - cont max SSI - lower iv steroids again to 60 mg bid  - latest hemoglobin A1c 12.5 on 07/30/2018    Suspected early CAP Management as stated above Monitor fever curve and WBC - cont IV abx  Resolving AKI Baseline creatinine is 1.0 with GFR greater than 60 Creat 1.3 > 1.24 here DC po lasix for now w/ poor po intake and no vol excess on exam Continue to avoid nephrotoxic agents DC IVF's today  Morbid obesity BMI 47 Recommend weight loss outpatient  Tracheostomy dependence: We will continue with in-house tracheostomy care.  Anxiety disorder: Patient very anxious.  Counseling been provided.  Continue with her home regimen.   Hypertension: Blood pressure at this point is slightly elevated.   - cont home meds norvasc/ lisinopril and coreg - BP well controlled  Chronic diastolic CHF - holding home Lasix 20 mg daily - dc IVF's today Latest 2D echo done on 06/04/2017 revealed LVEF 6065% with grade 1 diastolic dysfunction Start strict I's and O's and daily weight  DVT prophylaxis:  Subcu Heparin 3 times daily Code Status: Full code Family Communication:  None at bedside Disposition Plan:  DC to previous environment in 1 to 2 days when clinically stable. Consults called: None  Kelly Splinter MD [employer] pgr [phone]   10/26/2018, 3:40 PM       Objective: Vitals:   10/26/18 1120 10/26/18  1448 10/26/18 1515 10/26/18 1531  BP:  123/86    Pulse: 69 74  73  Resp: 20 14  15   Temp:  98.4 F (36.9 C)    TempSrc:  Oral    SpO2: 97% 95% 99% 99%  Weight:      Height:        Intake/Output Summary (Last 24  hours) at 10/26/2018 1540 Last data filed at 10/26/2018 0730 Gross per 24 hour  Intake 1430.99 ml  Output -  Net 1430.99 ml   Filed Weights   10/24/18 0030 10/25/18 0710  Weight: 113.5 kg 117.8 kg    Exam:  . General: 48 y.o. year-old female well developed well nourished in no acute distress.  Alert and oriented x3. . Cardiovascular: Regular rate and rhythm with no rubs or gallops.  No thyromegaly or JVD noted.   Marland Kitchen Respiratory: Diffuse insp and expiratory wheezes.  Good inspiratory effort.  Tracheostomy. Lots of raspy coughing. Not in distress . Abdomen: Soft nontender nondistended with normal bowel sounds x4 quadrants. . Musculoskeletal: Trace lower extremity edema. 2/4 pulses in all 4 extremities. Marland Kitchen Psychiatry: Mood is appropriate for condition and setting   Data Reviewed: CBC: Recent Labs  Lab 10/23/18 1630 10/24/18 0115 10/25/18 0222 10/26/18 0323  WBC 11.6* 9.0 16.1* 13.9*  HGB 12.2 12.1 11.3* 10.8*  HCT 38.8 37.7 36.3 35.4*  MCV 87.4 85.7 87.9 88.9  PLT 349 365 351 245   Basic Metabolic Panel: Recent Labs  Lab 10/23/18 1630 10/24/18 0115 10/25/18 0222 10/26/18 0323  NA 137 136 137 137  K 4.0 4.4 4.3 4.6  CL 103 104 105 103  CO2 20* 21* 23 24  GLUCOSE 330* 391* 334* 286*  BUN 16 14 25* 29*  CREATININE 1.31* 1.27* 1.24* 1.35*  CALCIUM 9.0 8.9 8.3* 8.2*   GFR: Estimated Creatinine Clearance: 60.3 mL/min (A) (by C-G formula based on SCr of 1.35 mg/dL (H)). Liver Function Tests: Recent Labs  Lab 10/24/18 0115  AST 21  ALT 30  ALKPHOS 107  BILITOT 0.4  PROT 8.1  ALBUMIN 3.5   No results for input(s): LIPASE, AMYLASE in the last 168 hours. No results for input(s): AMMONIA in the last 168 hours. Coagulation Profile: No results for input(s): INR, PROTIME in the last 168 hours. Cardiac Enzymes: Recent Labs  Lab 10/23/18 1630  TROPONINI <0.03   BNP (last 3 results) No results for input(s): PROBNP in the last 8760 hours. HbA1C: No results for  input(s): HGBA1C in the last 72 hours. CBG: Recent Labs  Lab 10/25/18 1110 10/25/18 1633 10/25/18 2021 10/26/18 0622 10/26/18 1102  GLUCAP 314* 360* 296* 221* 267*   Lipid Profile: No results for input(s): CHOL, HDL, LDLCALC, TRIG, CHOLHDL, LDLDIRECT in the last 72 hours. Thyroid Function Tests: No results for input(s): TSH, T4TOTAL, FREET4, T3FREE, THYROIDAB in the last 72 hours. Anemia Panel: No results for input(s): VITAMINB12, FOLATE, FERRITIN, TIBC, IRON, RETICCTPCT in the last 72 hours. Urine analysis:    Component Value Date/Time   COLORURINE STRAW (A) 03/06/2018 1212   APPEARANCEUR CLEAR 03/06/2018 1212   LABSPEC 1.030 03/06/2018 1212   PHURINE 6.0 03/06/2018 1212   GLUCOSEU >=500 (A) 03/06/2018 1212   HGBUR NEGATIVE 03/06/2018 1212   BILIRUBINUR neg 04/12/2018 1006   KETONESUR NEGATIVE 03/06/2018 1212   PROTEINUR Positive (A) 04/12/2018 1006   PROTEINUR NEGATIVE 03/06/2018 1212   UROBILINOGEN 0.2 04/12/2018 1006   UROBILINOGEN 0.2 11/18/2014 2325   NITRITE neg 04/12/2018 1006   NITRITE  NEGATIVE 03/06/2018 1212   LEUKOCYTESUR Negative 04/12/2018 1006   Sepsis Labs: @LABRCNTIP (procalcitonin:4,lacticidven:4)  ) Recent Results (from the past 240 hour(s))  Culture, blood (routine x 2) Call MD if unable to obtain prior to antibiotics being given     Status: None (Preliminary result)   Collection Time: 10/24/18  1:03 AM  Result Value Ref Range Status   Specimen Description BLOOD LEFT ANTECUBITAL  Final   Special Requests   Final    BOTTLES DRAWN AEROBIC AND ANAEROBIC Blood Culture adequate volume   Culture   Final    NO GROWTH 2 DAYS Performed at [facility], [place] [address] [place], [place] [address]    Report Status PENDING  Incomplete  Culture, blood (routine x 2) Call MD if unable to obtain prior to antibiotics being given     Status: None (Preliminary result)   Collection Time: 10/24/18  1:03 AM  Result Value Ref Range Status   Specimen Description  BLOOD LEFT ANTECUBITAL  Final   Special Requests   Final    BOTTLES DRAWN AEROBIC AND ANAEROBIC Blood Culture adequate volume   Culture   Final    NO GROWTH 2 DAYS Performed at [facility], [place] [address] [place], [place] [address]    Report Status PENDING  Incomplete  MRSA PCR Screening     Status: None   Collection Time: 10/24/18  7:46 AM  Result Value Ref Range Status   MRSA by PCR NEGATIVE NEGATIVE Final    Comment:        The GeneXpert MRSA Assay (FDA approved for NASAL specimens only), is one component of a comprehensive MRSA colonization surveillance program. It is not intended to diagnose MRSA infection nor to guide or monitor treatment for MRSA infections. Performed at [facility], [place] [address] [place], [place] [address]   Respiratory Panel by PCR     Status: None   Collection Time: 10/24/18  5:27 PM  Result Value Ref Range Status   Adenovirus NOT DETECTED NOT DETECTED Final   Coronavirus 229E NOT DETECTED NOT DETECTED Final   Coronavirus HKU1 NOT DETECTED NOT DETECTED Final   Coronavirus NL63 NOT DETECTED NOT DETECTED Final   Coronavirus OC43 NOT DETECTED NOT DETECTED Final   Metapneumovirus NOT DETECTED NOT DETECTED Final   Rhinovirus / Enterovirus NOT DETECTED NOT DETECTED Final   Influenza A NOT DETECTED NOT DETECTED Final   Influenza B NOT DETECTED NOT DETECTED Final   Parainfluenza Virus 1 NOT DETECTED NOT DETECTED Final   Parainfluenza Virus 2 NOT DETECTED NOT DETECTED Final   Parainfluenza Virus 3 NOT DETECTED NOT DETECTED Final   Parainfluenza Virus 4 NOT DETECTED NOT DETECTED Final   Respiratory Syncytial Virus NOT DETECTED NOT DETECTED Final   Bordetella pertussis NOT DETECTED NOT DETECTED Final   Chlamydophila pneumoniae NOT DETECTED NOT DETECTED Final   Mycoplasma pneumoniae NOT DETECTED NOT DETECTED Final    Comment: Performed at [facility], [place] [address] [place], [place] [address]      Studies: No results  found.  Scheduled Meds: . amLODipine  10 mg Oral Daily  . artificial tears  1 application Both Eyes QHS  . atorvastatin  20 mg Oral Daily  . azithromycin  500 mg Oral Daily  . buPROPion  150 mg Oral BID  . carvedilol  25 mg Oral BID WC  . gabapentin  300 mg Oral BID  . heparin  5,000 Units Subcutaneous Q8H  .  insulin aspart  0-20 Units Subcutaneous TID WC  . insulin aspart  0-5 Units Subcutaneous QHS  . insulin aspart  15 Units Subcutaneous TID WC  . insulin glargine  130 Units Subcutaneous Daily  . ipratropium-albuterol  3 mL Nebulization TID  . lisinopril  5 mg Oral Daily  . methylPREDNISolone (SOLU-MEDROL) injection  60 mg Intravenous Q8H  . mometasone-formoterol  2 puff Inhalation BID  . montelukast  10 mg Oral QHS  . senna-docusate  2 tablet Oral BID  . sertraline  100 mg Oral Daily  . sodium chloride HYPERTONIC  4 mL Nebulization BID    Continuous Infusions: . sodium chloride 50 mL/hr at 10/26/18 0650  . cefTRIAXone (ROCEPHIN)  IV Stopped (10/25/18 1932)     LOS: 3 days    If 7PM-7AM, please contact night-coverage [email or web address] Password [email or web address] 10/26/2018, 3:40 PM

## 2018-10-27 DIAGNOSIS — M25511 Pain in right shoulder: Secondary | ICD-10-CM | POA: Diagnosis not present

## 2018-10-27 DIAGNOSIS — E1142 Type 2 diabetes mellitus with diabetic polyneuropathy: Secondary | ICD-10-CM | POA: Diagnosis not present

## 2018-10-27 DIAGNOSIS — G4733 Obstructive sleep apnea (adult) (pediatric): Secondary | ICD-10-CM | POA: Diagnosis not present

## 2018-10-27 DIAGNOSIS — J181 Lobar pneumonia, unspecified organism: Secondary | ICD-10-CM | POA: Diagnosis not present

## 2018-10-27 DIAGNOSIS — J45901 Unspecified asthma with (acute) exacerbation: Secondary | ICD-10-CM | POA: Diagnosis not present

## 2018-10-27 DIAGNOSIS — Z930 Tracheostomy status: Secondary | ICD-10-CM | POA: Diagnosis not present

## 2018-10-27 DIAGNOSIS — I10 Essential (primary) hypertension: Secondary | ICD-10-CM | POA: Diagnosis not present

## 2018-10-27 DIAGNOSIS — J9601 Acute respiratory failure with hypoxia: Secondary | ICD-10-CM | POA: Diagnosis not present

## 2018-10-27 LAB — CBC
HEMATOCRIT: 34.2 % — AB (ref 36.0–46.0)
Hemoglobin: 11.1 g/dL — ABNORMAL LOW (ref 12.0–15.0)
MCH: 28.3 pg (ref 26.0–34.0)
MCHC: 32.5 g/dL (ref 30.0–36.0)
MCV: 87.2 fL (ref 80.0–100.0)
Platelets: 348 10*3/uL (ref 150–400)
RBC: 3.92 MIL/uL (ref 3.87–5.11)
RDW: 13.7 % (ref 11.5–15.5)
WBC: 13.5 10*3/uL — AB (ref 4.0–10.5)
nRBC: 0.2 % (ref 0.0–0.2)

## 2018-10-27 LAB — GLUCOSE, CAPILLARY
Glucose-Capillary: 151 mg/dL — ABNORMAL HIGH (ref 70–99)
Glucose-Capillary: 148 mg/dL — ABNORMAL HIGH (ref 70–99)
Glucose-Capillary: 226 mg/dL — ABNORMAL HIGH (ref 70–99)
Glucose-Capillary: 219 mg/dL — ABNORMAL HIGH (ref 70–99)

## 2018-10-27 LAB — BASIC METABOLIC PANEL
Anion gap: 6 (ref 5–15)
BUN: 22 mg/dL — AB (ref 6–20)
CALCIUM: 8.5 mg/dL — AB (ref 8.9–10.3)
CO2: 27 mmol/L (ref 22–32)
Chloride: 107 mmol/L (ref 98–111)
Creatinine, Ser: 1.09 mg/dL — ABNORMAL HIGH (ref 0.44–1.00)
GFR calc non Af Amer: 60 mL/min — ABNORMAL LOW (ref >60)
Glucose, Bld: 187 mg/dL — ABNORMAL HIGH (ref 70–99)
Potassium: 3.7 mmol/L (ref 3.5–5.1)
Sodium: 140 mmol/L (ref 135–145)

## 2018-10-27 MED ORDER — METHYLPREDNISOLONE SODIUM SUCC 40 MG IJ SOLR
40.0000 mg | Freq: Two times a day (BID) | INTRAMUSCULAR | Status: AC
  Administered 2018-10-27: 40 mg via INTRAVENOUS
  Filled 2018-10-27: qty 1

## 2018-10-27 MED ORDER — PREDNISONE 20 MG PO TABS
40.0000 mg | ORAL_TABLET | Freq: Every day | ORAL | Status: DC
  Administered 2018-10-28 – 2018-10-29 (×2): 40 mg via ORAL
  Filled 2018-10-27 (×2): qty 2

## 2018-10-27 MED ORDER — INSULIN ASPART 100 UNIT/ML ~~LOC~~ SOLN
10.0000 [IU] | Freq: Three times a day (TID) | SUBCUTANEOUS | Status: DC
  Administered 2018-10-27 – 2018-10-28 (×2): 10 [IU] via SUBCUTANEOUS

## 2018-10-27 MED ORDER — CEFDINIR 300 MG PO CAPS
300.0000 mg | ORAL_CAPSULE | Freq: Two times a day (BID) | ORAL | Status: DC
  Administered 2018-10-27 – 2018-10-29 (×5): 300 mg via ORAL
  Filled 2018-10-27 (×6): qty 1

## 2018-10-27 NOTE — Progress Notes (Signed)
PROGRESS NOTE    [name]  ZPH:[id number] DOB: 1971-07-18 DOA: 10/23/2018 PCP: [name], MD  Brief Narrative: Brittney Brightly Hutchinson-Matthewsis a 48 y.o.femalewith medical history significant oftracheal stenosis post tracheostomy, asthma, morbid obesity, type 2 diabetes, hypertension, GERD, and chronic pain syndrome who came to the ER with progressive shortness of breath, cough, and significant anxiety. Onset 2 days prior. Denied any nausea or vomiting. + Chest discomfort from coughing. X-ray suggested possible pneumonia. Admitted for acute on chronic hypoxic respiratory failure secondary to CAP.  Subjective: improving, coughing spells and breathing continues to improve  Assessment/Plan:  Acute asthma exacerbation/community acquired pneumonia Chest x-ray and CT chest revealed possible infiltrate versus atelectasis at right lower lobe Continuing IV azithromycin and Rocephin -improving on IV ceftriaxone and azithromycin, IV steroids nebulizations -We will transition to oral cefdinir and cutdown IV steroids, transition to prednisone from tomorrow -Respiratory virus panel and influenza panel are negative -Wean O2, discharge planning  Acute on chronic hypoxic respiratory/ chronic trach dependence Continue with tracheostomy care Maintain O2 saturation written 92%  Uncont Type 2 diabetes:due to high dose IV steroids+ infection/ stress of above -CBGs improved, continue Lantus 130 units daily, home dose -Last hemoglobin A1c was 12.5 on 07/30/2018 -Cutdown meal coverage insulin due to lowering of steroid doses  Resolving AKI Baseline creatinine is 1.0 with GFR greater than 60 Creat 1.3 > 1.24 here DC po lasix for now w/ poor po intake and no vol excess on exam Continue to avoid nephrotoxic agents  Morbid obesity BMI 47 Recommend weight loss outpatient  Tracheostomy dependence: We will continue with in-house tracheostomy care.  Anxiety disorder:  Patient very anxious. Counseling been provided. Continue with her home regimen.  Hypertension: Blood pressure at this point is slightly elevated.  - cont home meds norvasc/ lisinopril and coreg - BP well controlled  Chronic diastolic CHF - holding home Lasix 20 mg daily - dc IVF's today Latest 2D echo done on 06/04/2017 revealed LVEF 6065% with grade 1 diastolic dysfunction Start strict I's and O's and daily weight  DVT prophylaxis: Subcu Heparin 3 times daily Code Status:Full code Family Communication: None at bedside Disposition Plan:  Home tomorrow     Procedures:   Antimicrobials:    Objective: Vitals:   10/27/18 0615 10/27/18 0949 10/27/18 1106 10/27/18 1123  BP:    (!) 138/101  Pulse: 68 72 91 85  Resp: (!) 22 20 18    Temp:    98.3 F (36.8 C)  TempSrc:    Oral  SpO2: 100% 100% 99% 99%  Weight: 121.4 kg     Height:        Intake/Output Summary (Last 24 hours) at 10/27/2018 1343 Last data filed at 10/27/2018 8016 Gross per 24 hour  Intake 720 ml  Output 803 ml  Net -83 ml   Filed Weights   10/24/18 0030 10/25/18 0710 10/27/18 0615  Weight: 113.5 kg 117.8 kg 121.4 kg    Examination:  General exam: Obese chronically ill-appearing female, sitting up in bed, no distress  Respiratory system: Scattered rhonchi, improving air movement, no wheezes Cardiovascular system: S1 & S2 heard, RRR. No JVD, murmurs, rubs, gallops Gastrointestinal system: Abdomen is nondistended, soft and nontender.Normal bowel sounds heard. Central nervous system: Alert and oriented. No focal neurological deficits. Extremities: No edema Skin: No rashes, lesions or ulcers Psychiatry: Judgement and insight appear normal. Mood & affect appropriate.     Data Reviewed:   CBC: Recent Labs  Lab 10/23/18 1630 10/24/18 0115 10/25/18 0222  10/26/18 0323 10/27/18 0347  WBC 11.6* 9.0 16.1* 13.9* 13.5*  HGB 12.2 12.1 11.3* 10.8* 11.1*  HCT 38.8 37.7 36.3 35.4* 34.2*  MCV 87.4  85.7 87.9 88.9 87.2  PLT 349 365 351 361 076   Basic Metabolic Panel: Recent Labs  Lab 10/23/18 1630 10/24/18 0115 10/25/18 0222 10/26/18 0323 10/27/18 0347  NA 137 136 137 137 140  K 4.0 4.4 4.3 4.6 3.7  CL 103 104 105 103 107  CO2 20* 21* 23 24 27   GLUCOSE 330* 391* 334* 286* 187*  BUN 16 14 25* 29* 22*  CREATININE 1.31* 1.27* 1.24* 1.35* 1.09*  CALCIUM 9.0 8.9 8.3* 8.2* 8.5*   GFR: Estimated Creatinine Clearance: 76.1 mL/min (A) (by C-G formula based on SCr of 1.09 mg/dL (H)). Liver Function Tests: Recent Labs  Lab 10/24/18 0115  AST 21  ALT 30  ALKPHOS 107  BILITOT 0.4  PROT 8.1  ALBUMIN 3.5   No results for input(s): LIPASE, AMYLASE in the last 168 hours. No results for input(s): AMMONIA in the last 168 hours. Coagulation Profile: No results for input(s): INR, PROTIME in the last 168 hours. Cardiac Enzymes: Recent Labs  Lab 10/23/18 1630  TROPONINI <0.03   BNP (last 3 results) No results for input(s): PROBNP in the last 8760 hours. HbA1C: No results for input(s): HGBA1C in the last 72 hours. CBG: Recent Labs  Lab 10/26/18 1102 10/26/18 1632 10/26/18 2149 10/27/18 0613 10/27/18 1126  GLUCAP 267* 264* 321* 151* 148*   Lipid Profile: No results for input(s): CHOL, HDL, LDLCALC, TRIG, CHOLHDL, LDLDIRECT in the last 72 hours. Thyroid Function Tests: No results for input(s): TSH, T4TOTAL, FREET4, T3FREE, THYROIDAB in the last 72 hours. Anemia Panel: No results for input(s): VITAMINB12, FOLATE, FERRITIN, TIBC, IRON, RETICCTPCT in the last 72 hours. Urine analysis:    Component Value Date/Time   COLORURINE STRAW (A) 03/06/2018 1212   APPEARANCEUR CLEAR 03/06/2018 1212   LABSPEC 1.030 03/06/2018 1212   PHURINE 6.0 03/06/2018 1212   GLUCOSEU >=500 (A) 03/06/2018 1212   HGBUR NEGATIVE 03/06/2018 1212   BILIRUBINUR neg 04/12/2018 1006   KETONESUR NEGATIVE 03/06/2018 1212   PROTEINUR Positive (A) 04/12/2018 1006   PROTEINUR NEGATIVE 03/06/2018 1212     UROBILINOGEN 0.2 04/12/2018 1006   UROBILINOGEN 0.2 11/18/2014 2325   NITRITE neg 04/12/2018 1006   NITRITE NEGATIVE 03/06/2018 1212   LEUKOCYTESUR Negative 04/12/2018 1006   Sepsis Labs: @LABRCNTIP (procalcitonin:4,lacticidven:4)  ) Recent Results (from the past 240 hour(s))  Culture, blood (routine x 2) Call MD if unable to obtain prior to antibiotics being given     Status: None (Preliminary result)   Collection Time: 10/24/18  1:03 AM  Result Value Ref Range Status   Specimen Description BLOOD LEFT ANTECUBITAL  Final   Special Requests   Final    BOTTLES DRAWN AEROBIC AND ANAEROBIC Blood Culture adequate volume   Culture   Final    NO GROWTH 2 DAYS Performed at [facility], [place] [address], [place], [place] [address]    Report Status PENDING  Incomplete  Culture, blood (routine x 2) Call MD if unable to obtain prior to antibiotics being given     Status: None (Preliminary result)   Collection Time: 10/24/18  1:03 AM  Result Value Ref Range Status   Specimen Description BLOOD LEFT ANTECUBITAL  Final   Special Requests   Final    BOTTLES DRAWN AEROBIC AND ANAEROBIC Blood Culture adequate volume   Culture  Final    NO GROWTH 2 DAYS Performed at [facility], [place] [address] [place], [place] [address]    Report Status PENDING  Incomplete  MRSA PCR Screening     Status: None   Collection Time: 10/24/18  7:46 AM  Result Value Ref Range Status   MRSA by PCR NEGATIVE NEGATIVE Final    Comment:        The GeneXpert MRSA Assay (FDA approved for NASAL specimens only), is one component of a comprehensive MRSA colonization surveillance program. It is not intended to diagnose MRSA infection nor to guide or monitor treatment for MRSA infections. Performed at [facility], [place] [address] [place], [place] [address]   Respiratory Panel by PCR     Status: None   Collection Time: 10/24/18  5:27 PM  Result Value Ref Range Status   Adenovirus NOT DETECTED NOT  DETECTED Final   Coronavirus 229E NOT DETECTED NOT DETECTED Final   Coronavirus HKU1 NOT DETECTED NOT DETECTED Final   Coronavirus NL63 NOT DETECTED NOT DETECTED Final   Coronavirus OC43 NOT DETECTED NOT DETECTED Final   Metapneumovirus NOT DETECTED NOT DETECTED Final   Rhinovirus / Enterovirus NOT DETECTED NOT DETECTED Final   Influenza A NOT DETECTED NOT DETECTED Final   Influenza B NOT DETECTED NOT DETECTED Final   Parainfluenza Virus 1 NOT DETECTED NOT DETECTED Final   Parainfluenza Virus 2 NOT DETECTED NOT DETECTED Final   Parainfluenza Virus 3 NOT DETECTED NOT DETECTED Final   Parainfluenza Virus 4 NOT DETECTED NOT DETECTED Final   Respiratory Syncytial Virus NOT DETECTED NOT DETECTED Final   Bordetella pertussis NOT DETECTED NOT DETECTED Final   Chlamydophila pneumoniae NOT DETECTED NOT DETECTED Final   Mycoplasma pneumoniae NOT DETECTED NOT DETECTED Final    Comment: Performed at [facility], [place] [address], [place], [place] [address]         Radiology Studies: No results found.      Scheduled Meds: . amLODipine  10 mg Oral Daily  . artificial tears  1 application Both Eyes QHS  . atorvastatin  20 mg Oral Daily  . buPROPion  150 mg Oral BID  . carvedilol  25 mg Oral BID WC  . cefdinir  300 mg Oral Q12H  . gabapentin  300 mg Oral BID  . heparin  5,000 Units Subcutaneous Q8H  . insulin aspart  0-20 Units Subcutaneous TID WC  . insulin aspart  0-5 Units Subcutaneous QHS  . insulin aspart  15 Units Subcutaneous TID WC  . insulin glargine  130 Units Subcutaneous Daily  . ipratropium-albuterol  3 mL Nebulization TID  . lisinopril  5 mg Oral Daily  . methylPREDNISolone (SOLU-MEDROL) injection  40 mg Intravenous Q12H  . mometasone-formoterol  2 puff Inhalation BID  . montelukast  10 mg Oral QHS  . [START ON 10/28/2018] predniSONE  40 mg Oral Q breakfast  . senna-docusate  2 tablet Oral BID  . sertraline  100 mg Oral Daily   Continuous Infusions:   LOS: 4  days    Time spent: 81min    Domenic Polite, MD [facility]   10/27/2018, 1:43 PM

## 2018-10-28 DIAGNOSIS — M25511 Pain in right shoulder: Secondary | ICD-10-CM | POA: Diagnosis not present

## 2018-10-28 DIAGNOSIS — J181 Lobar pneumonia, unspecified organism: Secondary | ICD-10-CM | POA: Diagnosis not present

## 2018-10-28 DIAGNOSIS — J45901 Unspecified asthma with (acute) exacerbation: Secondary | ICD-10-CM | POA: Diagnosis not present

## 2018-10-28 DIAGNOSIS — I10 Essential (primary) hypertension: Secondary | ICD-10-CM | POA: Diagnosis not present

## 2018-10-28 DIAGNOSIS — Z930 Tracheostomy status: Secondary | ICD-10-CM | POA: Diagnosis not present

## 2018-10-28 DIAGNOSIS — G4733 Obstructive sleep apnea (adult) (pediatric): Secondary | ICD-10-CM | POA: Diagnosis not present

## 2018-10-28 DIAGNOSIS — J9601 Acute respiratory failure with hypoxia: Secondary | ICD-10-CM | POA: Diagnosis not present

## 2018-10-28 DIAGNOSIS — E1142 Type 2 diabetes mellitus with diabetic polyneuropathy: Secondary | ICD-10-CM | POA: Diagnosis not present

## 2018-10-28 LAB — GLUCOSE, CAPILLARY
Glucose-Capillary: 258 mg/dL — ABNORMAL HIGH (ref 70–99)
Glucose-Capillary: 149 mg/dL — ABNORMAL HIGH (ref 70–99)
Glucose-Capillary: 261 mg/dL — ABNORMAL HIGH (ref 70–99)
Glucose-Capillary: 191 mg/dL — ABNORMAL HIGH (ref 70–99)

## 2018-10-28 MED ORDER — NAPROXEN 375 MG PO TABS
375.0000 mg | ORAL_TABLET | Freq: Two times a day (BID) | ORAL | Status: DC
  Administered 2018-10-28 – 2018-10-29 (×2): 375 mg via ORAL
  Filled 2018-10-28 (×3): qty 1

## 2018-10-28 MED ORDER — INSULIN ASPART 100 UNIT/ML ~~LOC~~ SOLN
12.0000 [IU] | Freq: Three times a day (TID) | SUBCUTANEOUS | Status: DC
  Administered 2018-10-28 – 2018-10-29 (×2): 12 [IU] via SUBCUTANEOUS

## 2018-10-28 NOTE — Progress Notes (Signed)
PROGRESS NOTE    [name]  SAY:[id number] DOB: 03-23-1971 DOA: 10/23/2018 PCP: Brittney Rakes, MD  Brief Narrative: Brittney Brightly Hutchinson-Matthewsis a 48 y.o.femalewith medical history significant oftracheal stenosis post tracheostomy, asthma, morbid obesity, type 2 diabetes, hypertension, GERD, and chronic pain syndrome who came to the ER with progressive shortness of breath, cough, and significant anxiety. Onset 2 days prior. Denied any nausea or vomiting. + Chest discomfort from coughing. X-ray suggested possible pneumonia. Admitted for acute on chronic hypoxic respiratory failure secondary to CAP.  Subjective: -Continues to complain of increased cough congestion, reports that tracheal secretions have started improving -Complains of diffuse chronic pain  Assessment/Plan:  Acute asthma exacerbation/community acquired pneumonia Chest x-ray and CT chest revealed possible infiltrate versus atelectasis at right lower lobe Continuing IV azithromycin and Rocephin -improving on IV ceftriaxone and azithromycin, IV steroids nebulizations -She was transitioned to oral cefdinir and started on prednisone taper today  -Respiratory virus panel and influenza panel are negative -Wean O2, discharge planning -Ambulate, pulmonary toilet  Acute on chronic hypoxic respiratory/ chronic trach dependence Continue with tracheostomy care Maintain O2 saturation written 92%  Uncont Type 2 diabetes:due to high dose IV steroids+ infection/ stress of above -CBGs improved, continue Lantus 130 units daily, home dose -Last hemoglobin A1c was 12.5 on 07/30/2018 -Crease meal coverage insulin   Resolving AKI Baseline creatinine is 1.0 with GFR greater than 60 -Improved, back to baseline  Morbid obesity  -BMI is 50  -Recommended lifestyle modification, weight loss  -Also suspect undiagnosed sleep apnea, recommended outpatient sleep study to be scheduled through her PCPs  office  Tracheostomy dependence: We will continue with in-house tracheostomy care.  Anxiety disorder: Patient very anxious. Counseling been provided. Continue with her home regimen.  Hypertension: Blood pressure at this point is slightly elevated.  - cont home meds norvasc/ lisinopril and coreg - BP well controlled  Chronic diastolic CHF Latest 2D echo done on 06/04/2017 revealed LVEF 6065% with grade 1 diastolic dysfunction -restart oral Lasix 20 mg daily  DVT prophylaxis: Subcu Heparin 3 times daily Code Status:Full code Family Communication: None at bedside Disposition Plan:  Home tomorrow     Procedures:   Antimicrobials:    Objective: Vitals:   10/28/18 0413 10/28/18 0425 10/28/18 0736 10/28/18 1134  BP:  (!) 163/91    Pulse: 65 80  69  Resp: 18 18  18   Temp:  98.5 F (36.9 C)    TempSrc:  Oral    SpO2: 97% 97% 97% 97%  Weight:      Height:        Intake/Output Summary (Last 24 hours) at 10/28/2018 1338 Last data filed at 10/28/2018 1200 Gross per 24 hour  Intake 1440 ml  Output 651 ml  Net 789 ml   Filed Weights   10/25/18 0710 10/27/18 0615 10/28/18 0352  Weight: 117.8 kg 121.4 kg 121.6 kg    Examination:  Gen: Awake, Alert, Oriented X 3, [name], sitting up in bed, morbidly obese HEENT: PERRLA, Neck supple, no JVD Lungs: Few scattered rhonchi, conducted upper airway sounds, improved air movement, no wheezing today CVS: RRR,No Gallops,Rubs or new Murmurs Abd: soft, Non tender, non distended, BS present Extremities: No edema Skin: no new rashes Psychiatry: Judgement and insight appear normal. Mood & affect appropriate.     Data Reviewed:   CBC: Recent Labs  Lab 10/23/18 1630 10/24/18 0115 10/25/18 0222 10/26/18 0323 10/27/18 0347  WBC 11.6* 9.0 16.1* 13.9* 13.5*  HGB 12.2 12.1  11.3* 10.8* 11.1*  HCT 38.8 37.7 36.3 35.4* 34.2*  MCV 87.4 85.7 87.9 88.9 87.2  PLT 349 365 351 361 017   Basic  Metabolic Panel: Recent Labs  Lab 10/23/18 1630 10/24/18 0115 10/25/18 0222 10/26/18 0323 10/27/18 0347  NA 137 136 137 137 140  K 4.0 4.4 4.3 4.6 3.7  CL 103 104 105 103 107  CO2 20* 21* 23 24 27   GLUCOSE 330* 391* 334* 286* 187*  BUN 16 14 25* 29* 22*  CREATININE 1.31* 1.27* 1.24* 1.35* 1.09*  CALCIUM 9.0 8.9 8.3* 8.2* 8.5*   GFR: Estimated Creatinine Clearance: 76.2 mL/min (A) (by C-G formula based on SCr of 1.09 mg/dL (H)). Liver Function Tests: Recent Labs  Lab 10/24/18 0115  AST 21  ALT 30  ALKPHOS 107  BILITOT 0.4  PROT 8.1  ALBUMIN 3.5   No results for input(s): LIPASE, AMYLASE in the last 168 hours. No results for input(s): AMMONIA in the last 168 hours. Coagulation Profile: No results for input(s): INR, PROTIME in the last 168 hours. Cardiac Enzymes: Recent Labs  Lab 10/23/18 1630  TROPONINI <0.03   BNP (last 3 results) No results for input(s): PROBNP in the last 8760 hours. HbA1C: No results for input(s): HGBA1C in the last 72 hours. CBG: Recent Labs  Lab 10/27/18 1126 10/27/18 1607 10/27/18 2223 10/28/18 0638 10/28/18 1133  GLUCAP 148* 226* 219* 258* 149*   Lipid Profile: No results for input(s): CHOL, HDL, LDLCALC, TRIG, CHOLHDL, LDLDIRECT in the last 72 hours. Thyroid Function Tests: No results for input(s): TSH, T4TOTAL, FREET4, T3FREE, THYROIDAB in the last 72 hours. Anemia Panel: No results for input(s): VITAMINB12, FOLATE, FERRITIN, TIBC, IRON, RETICCTPCT in the last 72 hours. Urine analysis:    Component Value Date/Time   COLORURINE STRAW (A) 03/06/2018 1212   APPEARANCEUR CLEAR 03/06/2018 1212   LABSPEC 1.030 03/06/2018 1212   PHURINE 6.0 03/06/2018 1212   GLUCOSEU >=500 (A) 03/06/2018 1212   HGBUR NEGATIVE 03/06/2018 1212   BILIRUBINUR neg 04/12/2018 1006   KETONESUR NEGATIVE 03/06/2018 1212   PROTEINUR Positive (A) 04/12/2018 1006   PROTEINUR NEGATIVE 03/06/2018 1212   UROBILINOGEN 0.2 04/12/2018 1006   UROBILINOGEN 0.2  11/18/2014 2325   NITRITE neg 04/12/2018 1006   NITRITE NEGATIVE 03/06/2018 1212   LEUKOCYTESUR Negative 04/12/2018 1006   Sepsis Labs: @LABRCNTIP (procalcitonin:4,lacticidven:4)  ) Recent Results (from the past 240 hour(s))  Culture, blood (routine x 2) Call MD if unable to obtain prior to antibiotics being given     Status: None (Preliminary result)   Collection Time: 10/24/18  1:03 AM  Result Value Ref Range Status   Specimen Description BLOOD LEFT ANTECUBITAL  Final   Special Requests   Final    BOTTLES DRAWN AEROBIC AND ANAEROBIC Blood Culture adequate volume   Culture   Final    NO GROWTH 4 DAYS Performed at [facility], [place] [address] [place], [place] [address]    Report Status PENDING  Incomplete  Culture, blood (routine x 2) Call MD if unable to obtain prior to antibiotics being given     Status: None (Preliminary result)   Collection Time: 10/24/18  1:03 AM  Result Value Ref Range Status   Specimen Description BLOOD LEFT ANTECUBITAL  Final   Special Requests   Final    BOTTLES DRAWN AEROBIC AND ANAEROBIC Blood Culture adequate volume   Culture   Final    NO GROWTH 4 DAYS Performed at [facility], [place]  [address] [place], [place] [address]    Report Status PENDING  Incomplete  MRSA PCR Screening     Status: None   Collection Time: 10/24/18  7:46 AM  Result Value Ref Range Status   MRSA by PCR NEGATIVE NEGATIVE Final    Comment:        The GeneXpert MRSA Assay (FDA approved for NASAL specimens only), is one component of a comprehensive MRSA colonization surveillance program. It is not intended to diagnose MRSA infection nor to guide or monitor treatment for MRSA infections. Performed at [facility], [place] [address], [place], [place] [address]   Respiratory Panel by PCR     Status: None   Collection Time: 10/24/18  5:27 PM  Result Value Ref Range Status   Adenovirus NOT DETECTED NOT DETECTED Final   Coronavirus 229E NOT DETECTED NOT  DETECTED Final   Coronavirus HKU1 NOT DETECTED NOT DETECTED Final   Coronavirus NL63 NOT DETECTED NOT DETECTED Final   Coronavirus OC43 NOT DETECTED NOT DETECTED Final   Metapneumovirus NOT DETECTED NOT DETECTED Final   Rhinovirus / Enterovirus NOT DETECTED NOT DETECTED Final   Influenza A NOT DETECTED NOT DETECTED Final   Influenza B NOT DETECTED NOT DETECTED Final   Parainfluenza Virus 1 NOT DETECTED NOT DETECTED Final   Parainfluenza Virus 2 NOT DETECTED NOT DETECTED Final   Parainfluenza Virus 3 NOT DETECTED NOT DETECTED Final   Parainfluenza Virus 4 NOT DETECTED NOT DETECTED Final   Respiratory Syncytial Virus NOT DETECTED NOT DETECTED Final   Bordetella pertussis NOT DETECTED NOT DETECTED Final   Chlamydophila pneumoniae NOT DETECTED NOT DETECTED Final   Mycoplasma pneumoniae NOT DETECTED NOT DETECTED Final    Comment: Performed at [facility], [place] [address] [place], [place] [address]         Radiology Studies: No results found.      Scheduled Meds: . amLODipine  10 mg Oral Daily  . artificial tears  1 application Both Eyes QHS  . atorvastatin  20 mg Oral Daily  . buPROPion  150 mg Oral BID  . carvedilol  25 mg Oral BID WC  . cefdinir  300 mg Oral Q12H  . gabapentin  300 mg Oral BID  . heparin  5,000 Units Subcutaneous Q8H  . insulin aspart  0-20 Units Subcutaneous TID WC  . insulin aspart  0-5 Units Subcutaneous QHS  . insulin aspart  10 Units Subcutaneous TID WC  . insulin glargine  130 Units Subcutaneous Daily  . ipratropium-albuterol  3 mL Nebulization TID  . lisinopril  5 mg Oral Daily  . mometasone-formoterol  2 puff Inhalation BID  . montelukast  10 mg Oral QHS  . naproxen  375 mg Oral BID WC  . predniSONE  40 mg Oral Q breakfast  . senna-docusate  2 tablet Oral BID  . sertraline  100 mg Oral Daily   Continuous Infusions:   LOS: 5 days    Time spent: 30min    Domenic Polite, MD [facility]   10/28/2018, 1:38 PM

## 2018-10-29 DIAGNOSIS — J45901 Unspecified asthma with (acute) exacerbation: Secondary | ICD-10-CM | POA: Diagnosis not present

## 2018-10-29 DIAGNOSIS — G4733 Obstructive sleep apnea (adult) (pediatric): Secondary | ICD-10-CM

## 2018-10-29 DIAGNOSIS — M25511 Pain in right shoulder: Secondary | ICD-10-CM

## 2018-10-29 DIAGNOSIS — J189 Pneumonia, unspecified organism: Secondary | ICD-10-CM | POA: Diagnosis not present

## 2018-10-29 DIAGNOSIS — Z930 Tracheostomy status: Secondary | ICD-10-CM

## 2018-10-29 DIAGNOSIS — J9601 Acute respiratory failure with hypoxia: Secondary | ICD-10-CM | POA: Diagnosis not present

## 2018-10-29 DIAGNOSIS — E1142 Type 2 diabetes mellitus with diabetic polyneuropathy: Secondary | ICD-10-CM | POA: Diagnosis not present

## 2018-10-29 DIAGNOSIS — J181 Lobar pneumonia, unspecified organism: Secondary | ICD-10-CM | POA: Diagnosis not present

## 2018-10-29 DIAGNOSIS — R0602 Shortness of breath: Secondary | ICD-10-CM | POA: Diagnosis not present

## 2018-10-29 DIAGNOSIS — G8929 Other chronic pain: Secondary | ICD-10-CM

## 2018-10-29 DIAGNOSIS — I10 Essential (primary) hypertension: Secondary | ICD-10-CM | POA: Diagnosis not present

## 2018-10-29 LAB — GLUCOSE, CAPILLARY
Glucose-Capillary: 128 mg/dL — ABNORMAL HIGH (ref 70–99)
Glucose-Capillary: 129 mg/dL — ABNORMAL HIGH (ref 70–99)
Glucose-Capillary: 72 mg/dL (ref 70–99)

## 2018-10-29 LAB — CULTURE, BLOOD (ROUTINE X 2)
Culture: NO GROWTH
Culture: NO GROWTH
Special Requests: ADEQUATE
Special Requests: ADEQUATE

## 2018-10-29 LAB — BASIC METABOLIC PANEL
Anion gap: 12 (ref 5–15)
BUN: 16 mg/dL (ref 6–20)
CALCIUM: 8.6 mg/dL — AB (ref 8.9–10.3)
CO2: 27 mmol/L (ref 22–32)
Chloride: 102 mmol/L (ref 98–111)
Creatinine, Ser: 0.97 mg/dL (ref 0.44–1.00)
GFR calc Af Amer: >60 mL/min (ref >60)
GFR calc non Af Amer: >60 mL/min (ref >60)
Glucose, Bld: 108 mg/dL — ABNORMAL HIGH (ref 70–99)
Potassium: 3.2 mmol/L — ABNORMAL LOW (ref 3.5–5.1)
Sodium: 141 mmol/L (ref 135–145)

## 2018-10-29 MED ORDER — INSULIN DEGLUDEC 200 UNIT/ML ~~LOC~~ SOPN: 130.0000 [IU] | PEN_INJECTOR | Freq: Every day | SUBCUTANEOUS | Status: DC

## 2018-10-29 MED ORDER — GABAPENTIN 300 MG PO CAPS: 600.0000 mg | ORAL_CAPSULE | Freq: Two times a day (BID) | ORAL | 1 refills | Status: DC

## 2018-10-29 MED ORDER — IPRATROPIUM-ALBUTEROL 0.5-2.5 (3) MG/3ML IN SOLN: 3.0000 mL | Freq: Four times a day (QID) | RESPIRATORY_TRACT | Status: DC | PRN

## 2018-10-29 MED ORDER — BENZONATATE 200 MG PO CAPS: 200.0000 mg | ORAL_CAPSULE | Freq: Three times a day (TID) | ORAL | 0 refills | Status: DC | PRN

## 2018-10-29 MED ORDER — PREDNISONE 20 MG PO TABS: 20.0000 mg | ORAL_TABLET | Freq: Every day | ORAL | 0 refills | Status: AC

## 2018-10-29 MED ORDER — CEFDINIR 300 MG PO CAPS: 300.0000 mg | ORAL_CAPSULE | Freq: Two times a day (BID) | ORAL | 0 refills | Status: AC

## 2018-10-29 NOTE — Care Management Important Message (Signed)
Important Message  Patient Details  Name: [name] MRN: [id number] Date of Birth: 09-Feb-1971   Medicare Important Message Given:  Yes    [name] [place] 10/29/2018, 1:37 PM

## 2018-10-29 NOTE — Progress Notes (Signed)
10/29/2018 3:31 PM Discharge AVS meds taken today and those due this evening reviewed.  Follow-up appointments and when to call md reviewed.  D/C IV and TELE.  Questions and concerns addressed.   D/C home per orders. [place]

## 2018-10-29 NOTE — Discharge Summary (Signed)
Physician Discharge Summary  Rosalyn Archambault Enis ONG:[id number] DOB: 12/16/1970 DOA: 10/23/2018  PCP: [name], MD  Admit date: 10/23/2018 Discharge date: 10/29/2018  Time spent: 45 minutes  Recommendations for Outpatient Follow-up:  1. ENT/tracheostomy clinic with Dr.Sticker [facility]) in 2weeks 2. PCP Dr. Margarita Rana in 1 week   Discharge Diagnoses:  Acute hypoxic respiratory failure Community-acquired pneumonia   Asthma, chronic, unspecified asthma severity, with acute exacerbation   Sleep apnea, obstructive   Essential hypertension   Type 2 diabetes mellitus with diabetic polyneuropathy, without long-term current use of insulin (HCC)   Morbid obesity (HCC)   Tracheostomy dependent (HCC)   Severe asthma with exacerbation   Acute respiratory failure with hypoxia and hypercapnia ([place])   Community acquired pneumonia   Chronic right shoulder pain   Discharge Condition: stable  Diet recommendation: Diabetic  Filed Weights   10/27/18 0615 10/28/18 0352 10/29/18 0426  Weight: 121.4 kg 121.6 kg 121.9 kg    History of present illness:  [place] a 48 y.o.femalewith medical history significant oftracheal stenosis post tracheostomy, asthma, morbid obesity, type 2 diabetes, hypertension, GERD, and chronic pain syndrome who came to the ER with progressive shortness of breath, cough, and significant anxiety. Onset 2 days prior. Denied any nausea or vomiting. + Chest discomfort from coughing. X-ray suggested possible pneumonia  Hospital Course:   Acute asthma exacerbation/community acquired pneumonia Chest x-ray and CT chest revealed possible infiltrate in right lower lobe -Initially treated with IV ceftriaxone and azithromycin, IV Solu-Medrol  -Slowly improved clinically subsequently transitioned to prednisone taper and oral cefdinir -Respiratory virus panel and influenza panel are negative -Weaned off O2, ambulating, eating and drinking  well -Still has some residual cough congestion and rib pain from coughing  -Clinically felt to be stable for discharge home today, advised to follow-up with PCP in 1 week  acute on chronic hypoxic respiratory/ chronic trach dependence Continue with tracheostomy care -Trach collar at room air at this time -Followed by ENT Dr. sticker at [facility] Type 2 diabetes:due to high dose IV steroids+ infection/ stress of above -CBGs improved, continue Lantus 130 units daily, home dose -Last hemoglobin A1c was 12.5 on 07/30/2018 -Started on meal coverage inpatient, advised higher dose of Lantus at home while she is on prednisone for 4 more days   Resolving AKI Baseline creatinine is 1.0 with GFR greater than 60 -Improved, back to baseline  Morbid obesity  -BMI is 50  -Recommended lifestyle modification, weight loss  -Also suspect undiagnosed sleep apnea, recommended outpatient sleep study to be scheduled through her PCPs office  Tracheostomy dependence: We will continue with in-house tracheostomy care.  Anxiety disorder: Patient very anxious. Counseling been provided. Continue with her home regimen.  Hypertension: Blood pressure at this point is slightly elevated. - cont home meds norvasc/ lisinopril and coreg - BP well controlled  Chronic diastolic CHF Latest 2D echo done on 06/04/2017 revealed LVEF 6065% with grade 1 diastolic dysfunction -restart oral Lasix 20 mg daily  Discharge Exam: Vitals:   10/29/18 1111 10/29/18 1216  BP:  116/83  Pulse: 73 72  Resp: 16 14  Temp:  98.3 F (36.8 C)  SpO2: 97% 97%    General: AAOx3 Cardiovascular: S1S2/RRR Respiratory: few scattered ronchi  Discharge Instructions   Discharge Instructions    Diet - low sodium heart healthy   Complete by:  As directed    Diet Carb Modified   Complete by:  As directed    Increase activity slowly   Complete  by:  As directed      Allergies as of 10/29/2018      Reactions   Reglan  [metoclopramide] Other (See Comments)   Panic attack      Medication List    STOP taking these medications   naproxen 500 MG tablet Commonly known as:  NAPROSYN   topiramate 100 MG tablet Commonly known as:  TOPAMAX     TAKE these medications   ACCU-CHEK AVIVA PLUS w/Device Kit Used to check blood sugars once daily. E11.9   ACCU-CHEK SOFTCLIX LANCETS lancets Use as instructed to check blood sugar once daily. E11.9   albuterol 108 (90 Base) MCG/ACT inhaler Commonly known as:  PROAIR HFA Inhale 2 puffs into the lungs every 6 (six) hours as needed for wheezing or shortness of breath.   albuterol (2.5 MG/3ML) 0.083% nebulizer solution Commonly known as:  PROVENTIL Take 3 mLs (2.5 mg total) by nebulization every 6 (six) hours as needed for wheezing or shortness of breath.   amLODipine 10 MG tablet Commonly known as:  NORVASC Take 1 tablet (10 mg total) by mouth daily.   aspirin-acetaminophen-caffeine 250-250-65 MG tablet Commonly known as:  EXCEDRIN MIGRAINE Take 2 tablets by mouth every 6 (six) hours as needed for headache.   atorvastatin 20 MG tablet Commonly known as:  LIPITOR Take 1 tablet (20 mg total) by mouth daily.   benzonatate 200 MG capsule Commonly known as:  TESSALON Take 1 capsule (200 mg total) by mouth 3 (three) times daily as needed for cough.   budesonide-formoterol 160-4.5 MCG/ACT inhaler Commonly known as:  SYMBICORT Inhale 2 puffs into the lungs 2 (two) times daily.   buPROPion 150 MG 12 hr tablet Commonly known as:  WELLBUTRIN SR Take 1 tablet (150 mg total) by mouth 2 (two) times daily.   carvedilol 12.5 MG tablet Commonly known as:  COREG Take 2 tablets (25 mg total) by mouth 2 (two) times daily with a meal.   cefdinir 300 MG capsule Commonly known as:  OMNICEF Take 1 capsule (300 mg total) by mouth every 12 (twelve) hours for 2 days.   FIFTY50 PEN NEEDLES 31G X 8 MM Misc Generic drug:  Insulin Pen Needle Inject 1 Units as directed  daily.   furosemide 20 MG tablet Commonly known as:  LASIX Take 1 tablet (20 mg total) by mouth daily.   gabapentin 300 MG capsule Commonly known as:  NEURONTIN Take 2 capsules (600 mg total) by mouth 2 (two) times daily.   glucose blood test strip Commonly known as:  ACCU-CHEK AVIVA Use as directed to check blood sugars once daily. E11.9   hydrOXYzine 25 MG tablet Commonly known as:  ATARAX/VISTARIL Take 1 tablet (25 mg total) by mouth 3 (three) times daily as needed. What changed:  reasons to take this   Insulin Degludec 200 UNIT/ML Sopn Commonly known as:  TRESIBA FLEXTOUCH Inject 130 Units into the skin daily. Take an extra 10 units while on Prednisone for the next 4days What changed:  additional instructions   lisinopril 5 MG tablet Commonly known as:  PRINIVIL,ZESTRIL Take 1 tablet (5 mg total) by mouth daily.   Melatonin 3 MG Tabs Take 3 mg by mouth at bedtime as needed (for sleep).   montelukast 10 MG tablet Commonly known as:  SINGULAIR Take 1 tablet (10 mg total) by mouth at bedtime.   naproxen sodium 220 MG tablet Commonly known as:  ALEVE Take 440 mg by mouth 2 (two) times daily as needed (pain).  predniSONE 20 MG tablet Commonly known as:  DELTASONE Take 1-2 tablets (20-40 mg total) by mouth daily with breakfast for 4 days. Take 74m daily for 2days then 270mdaily for 2days then STOP Start taking on:  October 30, 2018   sertraline 100 MG tablet Commonly known as:  ZOLOFT Place 1 tablet (100 mg total) into feeding tube daily. What changed:    how to take this  when to take this   sodium chloride HYPERTONIC 3 % nebulizer solution Take 4 mLs by nebulization 2 (two) times daily.   SUMAtriptan 100 MG tablet Commonly known as:  IMITREX At the onset of a migraine; may repeat in 2 hours. Max daily dose 20041mhat changed:    how much to take  how to take this  when to take this  additional instructions   tiZANidine 4 MG tablet Commonly  known as:  ZANAFLEX Take 1 tablet (4 mg total) by mouth every 8 (eight) hours as needed for muscle spasms.      Allergies  Allergen Reactions  . Reglan [Metoclopramide] Other (See Comments)    Panic attack      The results of significant diagnostics from this hospitalization (including imaging, microbiology, ancillary and laboratory) are listed below for reference.    Significant Diagnostic Studies: Ct Angio Chest Pe W Or Wo Contrast  Result Date: 10/23/2018 CLINICAL DATA:  Shortness of breath. Bloody mucus via trach. Cough. EXAM: CT ANGIOGRAPHY CHEST WITH CONTRAST TECHNIQUE: Multidetector CT imaging of the chest was performed using the standard protocol during bolus administration of intravenous contrast. Multiplanar CT image reconstructions and MIPs were obtained to evaluate the vascular anatomy. CONTRAST:  100m53mOVUE-370 IOPAMIDOL (ISOVUE-370) INJECTION 76% COMPARISON:  03/05/2018 FINDINGS: Cardiovascular: Heart is enlarged. No substantial pericardial effusion. Ascending thoracic aorta measures 3.9 cm maximum dimension. The no filling defect identified in the opacified pulmonary arteries to suggest the presence of an acute pulmonary embolus. Mediastinum/Nodes: 1.8 cm short axis right paratracheal lymph node is stable in the interval. Upper normal lymph nodes are seen in the right hilum. No left hilar lymphadenopathy. The esophagus has normal imaging features. There is no axillary lymphadenopathy. Lungs/Pleura: Tracheostomy tube again noted. Tip is positioned in the proximal to mid trachea. No focal airspace consolidation. No suspicious nodule or mass. No pleural effusion. Upper Abdomen: The liver shows diffusely decreased attenuation suggesting steatosis. Otherwise unremarkable. Musculoskeletal: No worrisome lytic or sclerotic osseous abnormality. Review of the MIP images confirms the above findings. IMPRESSION: 1. No CT evidence for acute pulmonary embolus. 2. Stable cardiomegaly. 3. Hepatic  steatosis Electronically Signed   By: EricMisty Stanley.   On: 10/23/2018 21:40   Dg Chest Port 1 View  Result Date: 10/23/2018 CLINICAL DATA:  Shortness of breath and cough EXAM: PORTABLE CHEST 1 VIEW COMPARISON:  03/05/2018 FINDINGS: Tracheostomy tube is in place. Mild cardiomegaly. Hazy right infrahilar atelectasis or developing infiltrate. No pneumothorax. Probable small right effusion. IMPRESSION: 1. Hazy right infrahilar atelectasis or developing infiltrate. Probable small right effusion. 2. Mild cardiomegaly Electronically Signed   By: Kim Donavan Foil.   On: 10/23/2018 16:59    Microbiology: Recent Results (from the past 240 hour(s))  Culture, blood (routine x 2) Call MD if unable to obtain prior to antibiotics being given     Status: None   Collection Time: 10/24/18  1:03 AM  Result Value Ref Range Status   Specimen Description BLOOD LEFT ANTECUBITAL  Final   Special Requests   Final  BOTTLES DRAWN AEROBIC AND ANAEROBIC Blood Culture adequate volume   Culture   Final    NO GROWTH 5 DAYS Performed at [facility], [place] [address] [place], [place] [address]    Report Status 10/29/2018 FINAL  Final  Culture, blood (routine x 2) Call MD if unable to obtain prior to antibiotics being given     Status: None   Collection Time: 10/24/18  1:03 AM  Result Value Ref Range Status   Specimen Description BLOOD LEFT ANTECUBITAL  Final   Special Requests   Final    BOTTLES DRAWN AEROBIC AND ANAEROBIC Blood Culture adequate volume   Culture   Final    NO GROWTH 5 DAYS Performed at [facility], [place] [address] [place], [place] [address]    Report Status 10/29/2018 FINAL  Final  MRSA PCR Screening     Status: None   Collection Time: 10/24/18  7:46 AM  Result Value Ref Range Status   MRSA by PCR NEGATIVE NEGATIVE Final    Comment:        The GeneXpert MRSA Assay (FDA approved for NASAL specimens only), is one component of a comprehensive MRSA colonization surveillance  program. It is not intended to diagnose MRSA infection nor to guide or monitor treatment for MRSA infections. Performed at [facility], [place] [address] [place], [place] [address]   Respiratory Panel by PCR     Status: None   Collection Time: 10/24/18  5:27 PM  Result Value Ref Range Status   Adenovirus NOT DETECTED NOT DETECTED Final   Coronavirus 229E NOT DETECTED NOT DETECTED Final   Coronavirus HKU1 NOT DETECTED NOT DETECTED Final   Coronavirus NL63 NOT DETECTED NOT DETECTED Final   Coronavirus OC43 NOT DETECTED NOT DETECTED Final   Metapneumovirus NOT DETECTED NOT DETECTED Final   Rhinovirus / Enterovirus NOT DETECTED NOT DETECTED Final   Influenza A NOT DETECTED NOT DETECTED Final   Influenza B NOT DETECTED NOT DETECTED Final   Parainfluenza Virus 1 NOT DETECTED NOT DETECTED Final   Parainfluenza Virus 2 NOT DETECTED NOT DETECTED Final   Parainfluenza Virus 3 NOT DETECTED NOT DETECTED Final   Parainfluenza Virus 4 NOT DETECTED NOT DETECTED Final   Respiratory Syncytial Virus NOT DETECTED NOT DETECTED Final   Bordetella pertussis NOT DETECTED NOT DETECTED Final   Chlamydophila pneumoniae NOT DETECTED NOT DETECTED Final   Mycoplasma pneumoniae NOT DETECTED NOT DETECTED Final    Comment: Performed at [facility], [place] [address] [place], [place] [address]     Labs: Basic Metabolic Panel: Recent Labs  Lab 10/24/18 0115 10/25/18 0222 10/26/18 0323 10/27/18 0347 10/29/18 0342  NA 136 137 137 140 141  K 4.4 4.3 4.6 3.7 3.2*  CL 104 105 103 107 102  CO2 21* _0 GLUCOSE 391* 334* 286* 187* 108*  BUN 14 25* 29* 22* 16  CREATININE 1.27* 1.24* 1.35* 1.09* 0.97  CALCIUM 8.9 8.3* 8.2* 8.5* 8.6*   Liver Function Tests: Recent Labs  Lab 10/24/18 0115  AST 21  ALT 30  ALKPHOS 107  BILITOT 0.4  PROT 8.1  ALBUMIN 3.5   No results for input(s): LIPASE, AMYLASE in the last 168 hours. No results for input(s): AMMONIA in the last 168  hours. CBC: Recent Labs  Lab 10/23/18 1630 10/24/18 0115 10/25/18 0222 10/26/18 0323 10/27/18 0347  WBC 11.6* 9.0 16.1* 13.9* 13.5*  HGB 12.2 12.1 11.3* 10.8* 11.1*  HCT  38.8 37.7 36.3 35.4* 34.2*  MCV 87.4 85.7 87.9 88.9 87.2  PLT 349 365 351 361 348   Cardiac Enzymes: Recent Labs  Lab 10/23/18 1630  TROPONINI <0.03   BNP: BNP (last 3 results) No results for input(s): BNP in the last 8760 hours.  ProBNP (last 3 results) No results for input(s): PROBNP in the last 8760 hours.  CBG: Recent Labs  Lab 10/28/18 1559 10/28/18 2131 10/29/18 0607 10/29/18 1050 10/29/18 1113  GLUCAP 261* 191* 128* 72 129*       Signed:  Domenic Polite MD.  [facility] 10/29/2018, 1:29 PM

## 2018-10-29 NOTE — Care Management Note (Signed)
Case Management Note Marvetta Gibbons RN, BSN Transitions of Care Unit 4E- RN Case Manager [phone]  Patient Details  Name: [name] MRN: [id number] Date of Birth: 30-Jan-1971  Subjective/Objective:   Pt admitted with Asthma and  CAP                Action/Plan: PTA pt lived at home, hx chronic trach and has home 02 PRN. Pt is followed [facility] for primary care. No CM needs noted for transition home.   Expected Discharge Date:  10/29/18               Expected Discharge Plan:  Home/Self Care  In-House Referral:  NA  Discharge planning Services  CM Consult  Post Acute Care Choice:  NA Choice offered to:  NA  DME Arranged:    DME Agency:     HH Arranged:    HH Agency:     Status of Service:  Completed, signed off  If discussed at [place] of Stay Meetings, dates discussed:    Discharge Disposition: home/self care   Additional Comments:  Dawayne Patricia, RN 10/29/2018, 11:46 AM

## 2018-11-01 ENCOUNTER — Telehealth: Payer: Self-pay

## 2018-11-01 DIAGNOSIS — J9500 Unspecified tracheostomy complication: Secondary | ICD-10-CM | POA: Diagnosis not present

## 2018-11-01 DIAGNOSIS — G4733 Obstructive sleep apnea (adult) (pediatric): Secondary | ICD-10-CM | POA: Diagnosis not present

## 2018-11-01 DIAGNOSIS — J386 Stenosis of larynx: Secondary | ICD-10-CM | POA: Diagnosis not present

## 2018-11-01 DIAGNOSIS — J45909 Unspecified asthma, uncomplicated: Secondary | ICD-10-CM | POA: Diagnosis not present

## 2018-11-01 DIAGNOSIS — J9601 Acute respiratory failure with hypoxia: Secondary | ICD-10-CM | POA: Diagnosis not present

## 2018-11-01 DIAGNOSIS — Z930 Tracheostomy status: Secondary | ICD-10-CM | POA: Diagnosis not present

## 2018-11-01 DIAGNOSIS — I5020 Unspecified systolic (congestive) heart failure: Secondary | ICD-10-CM | POA: Diagnosis not present

## 2018-11-01 DIAGNOSIS — J449 Chronic obstructive pulmonary disease, unspecified: Secondary | ICD-10-CM | POA: Diagnosis not present

## 2018-11-01 NOTE — Telephone Encounter (Signed)
Transition Care Management Follow-up Telephone Call Date of discharge and from where:  10/29/2018, [facility].  Call placed to # [phone] ad # [phone] and messages were left on both numbers requesting a call back to this CM # [phone]

## 2018-11-02 ENCOUNTER — Other Ambulatory Visit (INDEPENDENT_AMBULATORY_CARE_PROVIDER_SITE_OTHER): Payer: Self-pay | Admitting: Orthopedic Surgery

## 2018-11-02 ENCOUNTER — Telehealth: Payer: Self-pay

## 2018-11-02 DIAGNOSIS — G8929 Other chronic pain: Secondary | ICD-10-CM

## 2018-11-02 DIAGNOSIS — M25511 Pain in right shoulder: Principal | ICD-10-CM

## 2018-11-02 NOTE — Telephone Encounter (Signed)
Transition Care Management Follow-up Telephone Call Date of discharge and from where:  10/29/2018, [facility].  Call placed to # [phone] ad # [phone] and messages were left on both numbers requesting a call back to this CM # [phone]

## 2018-11-03 DIAGNOSIS — J9500 Unspecified tracheostomy complication: Secondary | ICD-10-CM | POA: Diagnosis not present

## 2018-11-04 DIAGNOSIS — J9500 Unspecified tracheostomy complication: Secondary | ICD-10-CM | POA: Diagnosis not present

## 2018-11-08 DIAGNOSIS — J9500 Unspecified tracheostomy complication: Secondary | ICD-10-CM | POA: Diagnosis not present

## 2018-11-10 DIAGNOSIS — J9500 Unspecified tracheostomy complication: Secondary | ICD-10-CM | POA: Diagnosis not present

## 2018-11-11 ENCOUNTER — Encounter (INDEPENDENT_AMBULATORY_CARE_PROVIDER_SITE_OTHER): Payer: Self-pay | Admitting: *Deleted

## 2018-11-11 DIAGNOSIS — J9500 Unspecified tracheostomy complication: Secondary | ICD-10-CM | POA: Diagnosis not present

## 2018-11-11 IMAGING — US ULTRASOUND RIGHT BREAST LIMITED
1 series · 3 of 3 positions shown · non-contrast
Comparison: Previous exam(s).

CLINICAL DATA: 38-year-old female with a palpable right breast
mass.

EXAM:
DIGITAL DIAGNOSTIC BILATERAL MAMMOGRAM WITH CAD AND TOMO
RIGHT BREAST ULTRASOUND

[Series 1: ultrasound right breast limited · 0.07mm/px · 3 of 3 slices shown]
[im 1/3]
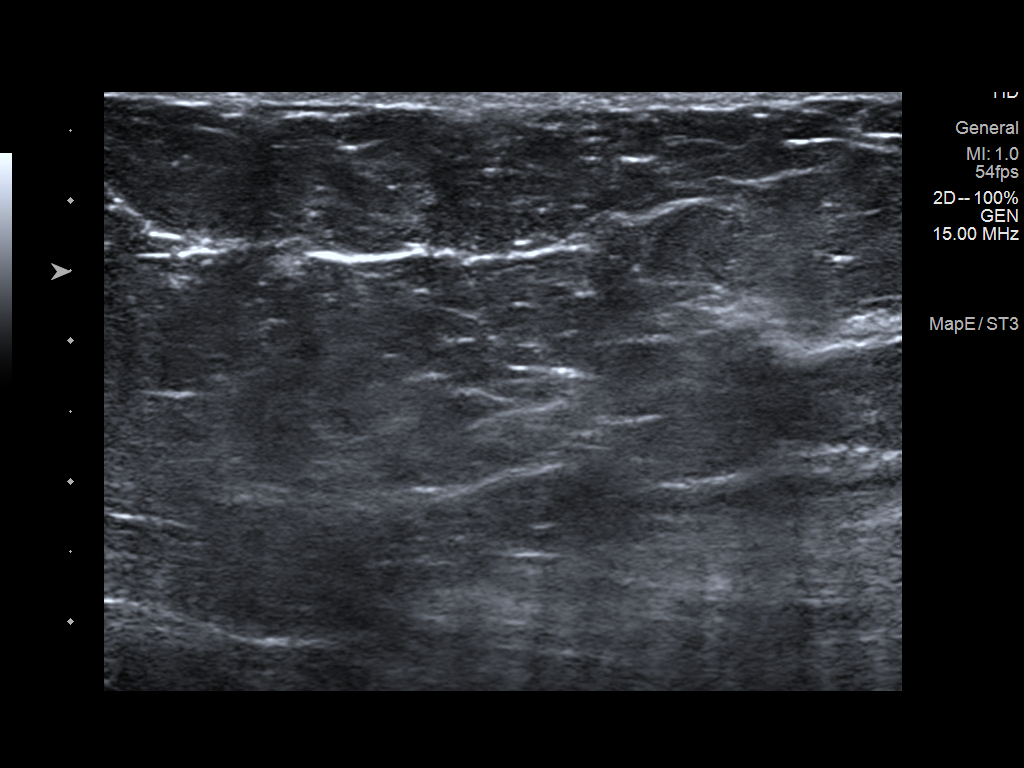
[im 2/3]
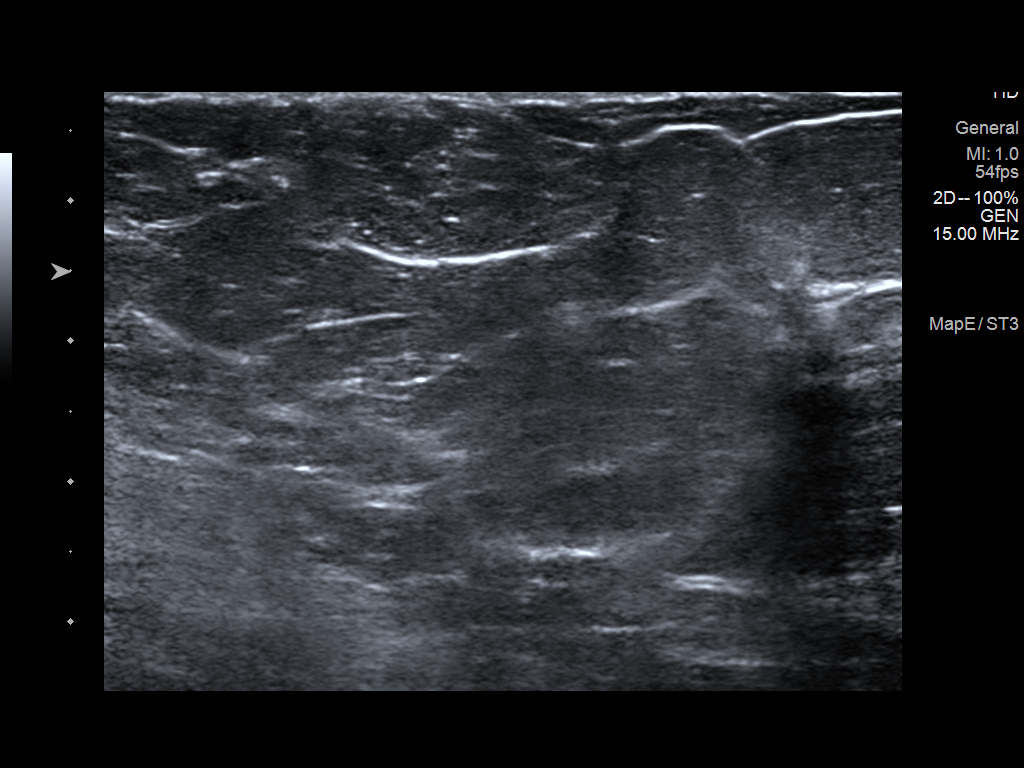
[im 3/3]
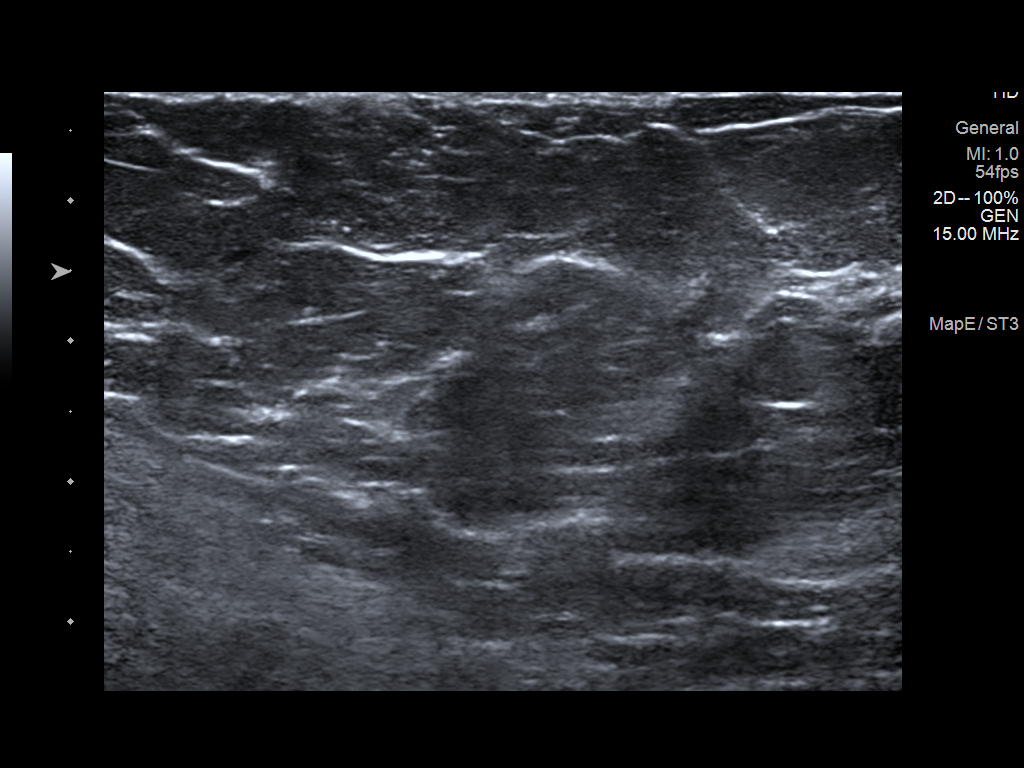

[3 of 3 positions shown; findings below may reference images not displayed]

ACR Breast Density Category b: There are scattered areas of
fibroglandular density.
FINDINGS: No suspicious masses or calcifications are seen in either breast.
Spot compression tangential tomograms were performed over the
palpable area of concern in the upper right breast with no
abnormalities identified.

Mammographic images were processed with CAD.

Physical examination at site of palpable concern in the superior
right breast does not reveal any palpable masses.

Targeted ultrasound of the right breast was performed. No suspicious
masses or abnormalities identified, only normal-appearing
fibroglandular tissue seen.
IMPRESSION: 1.  No findings of malignancy in either breast.

2. No mammographic or sonographic abnormalities at the site of
palpable concern in the superior right breast.

RECOMMENDATION:
1. Recommend further management of the right breast palpable
abnormality be based on clinical assessment.

2.  Screening mammogram in one year.(Code:ZC-L-8PW)

I have discussed the findings and recommendations with the patient.
Results were also provided in writing at the conclusion of the
visit. If applicable, a reminder letter will be sent to the patient
regarding the next appointment.

BI-RADS CATEGORY  1: Negative.

## 2018-11-11 IMAGING — MG DIGITAL DIAGNOSTIC BILATERAL MAMMOGRAM WITH TOMO AND CAD
6 of 10 series · 6 of 30 positions shown · non-contrast
Comparison: Previous exam(s).

CLINICAL DATA: 38-year-old female with a palpable right breast
mass.

EXAM:
DIGITAL DIAGNOSTIC BILATERAL MAMMOGRAM WITH CAD AND TOMO
RIGHT BREAST ULTRASOUND

[L CC synth-2D]
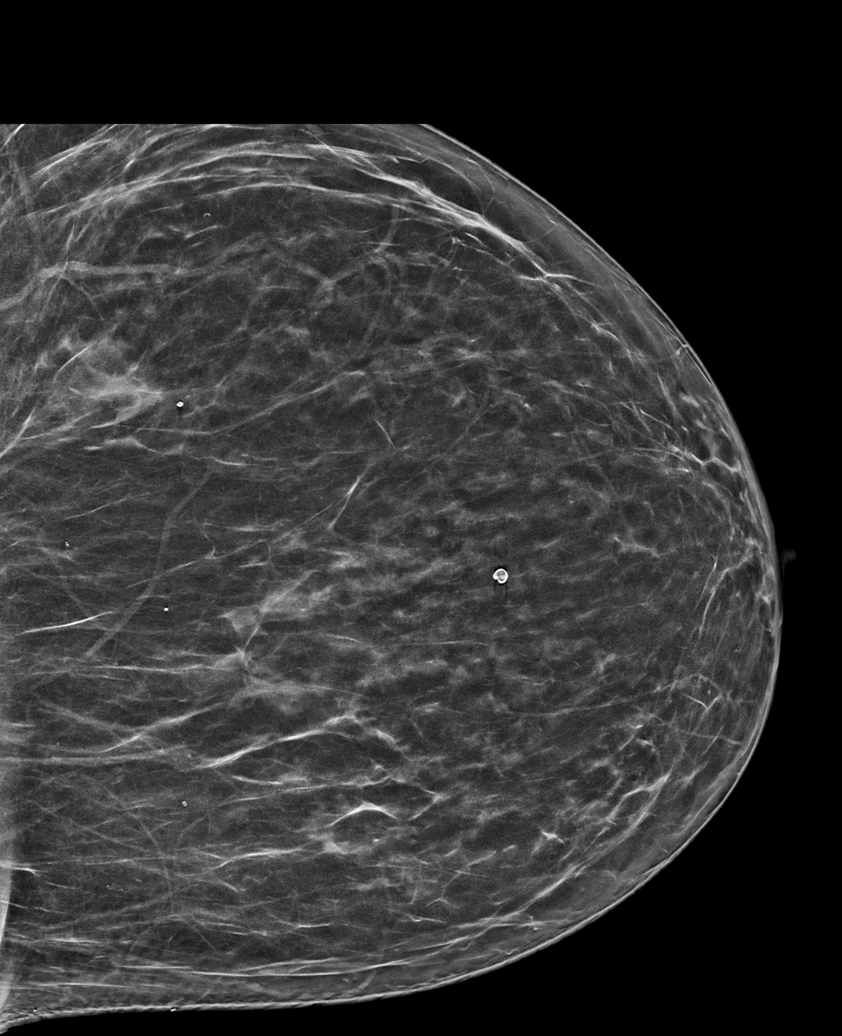

[L MLO synth-2D]
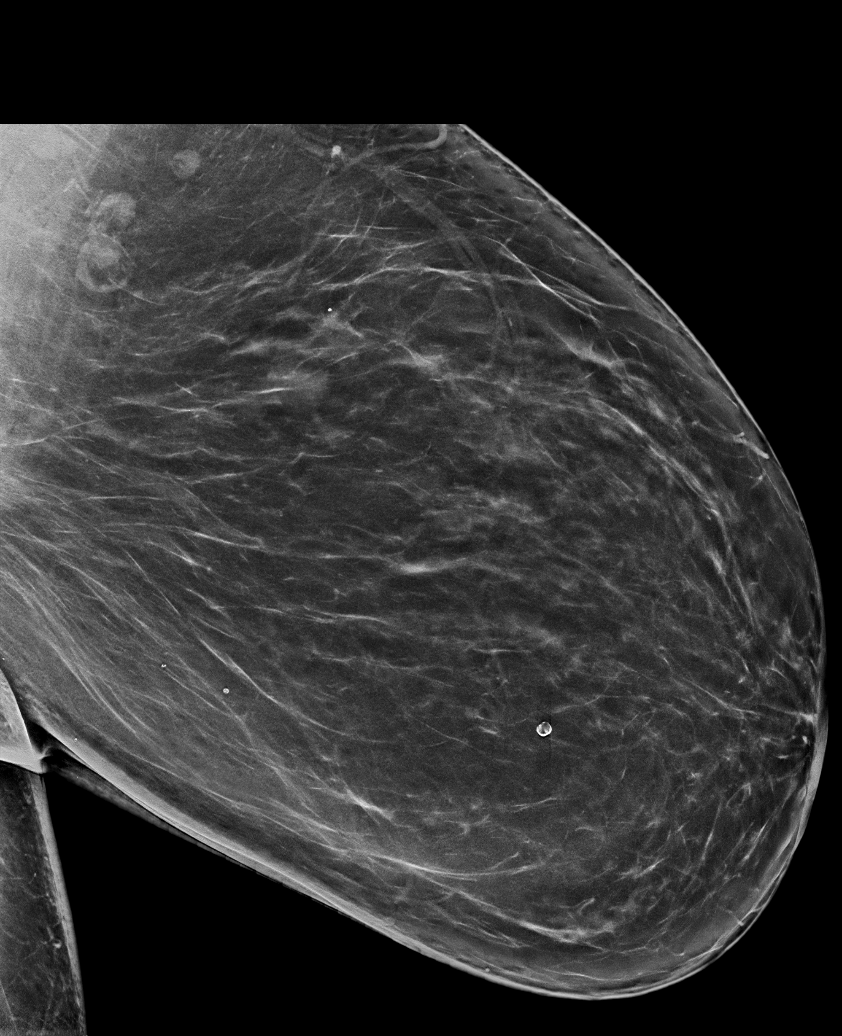

[R CC synth-2D]
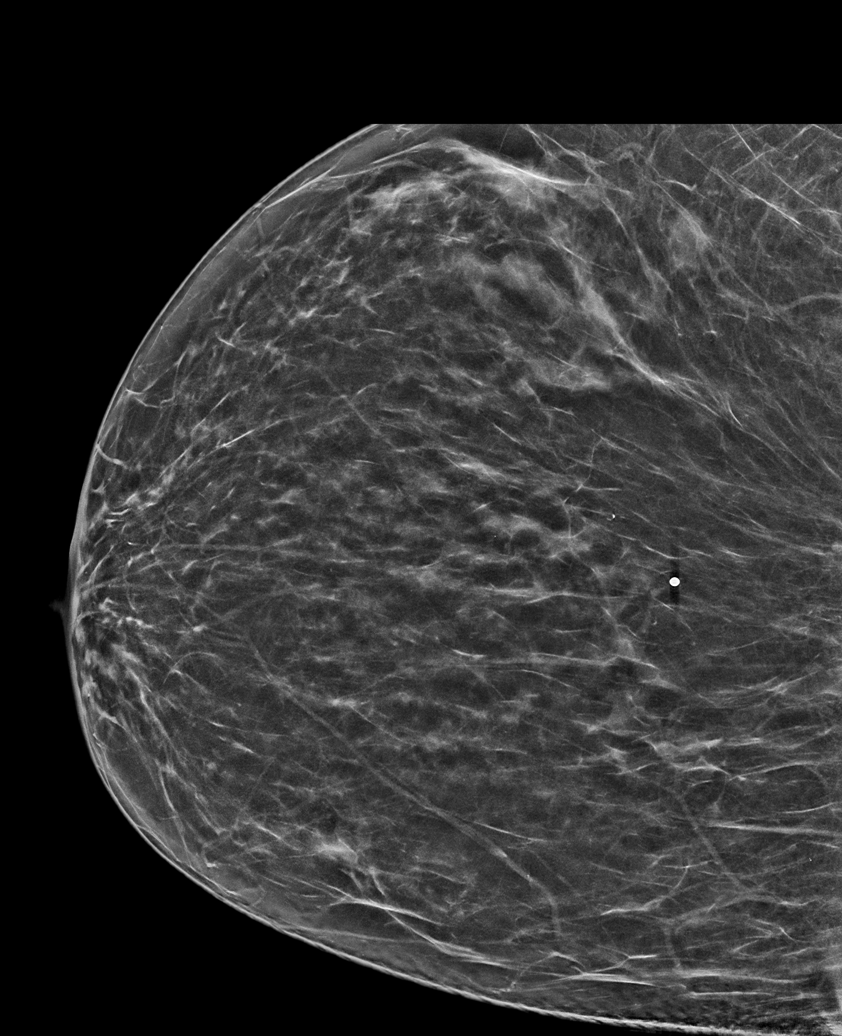

[R TAN synth-2D]
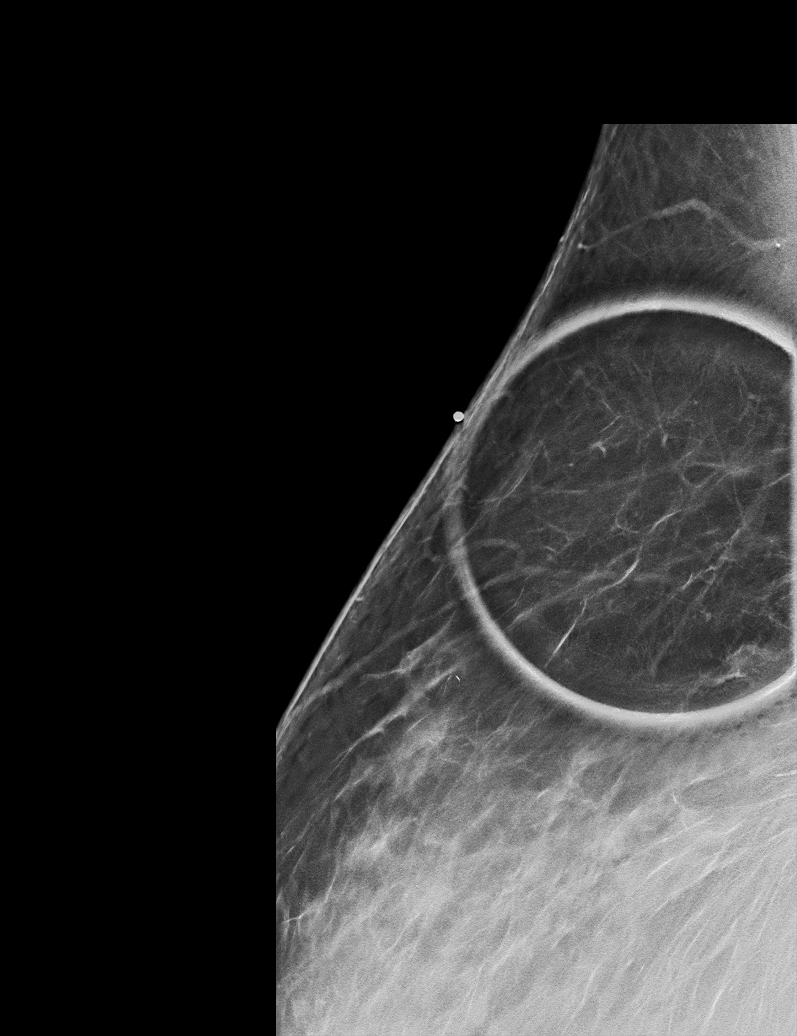

[R MLO synth-2D]
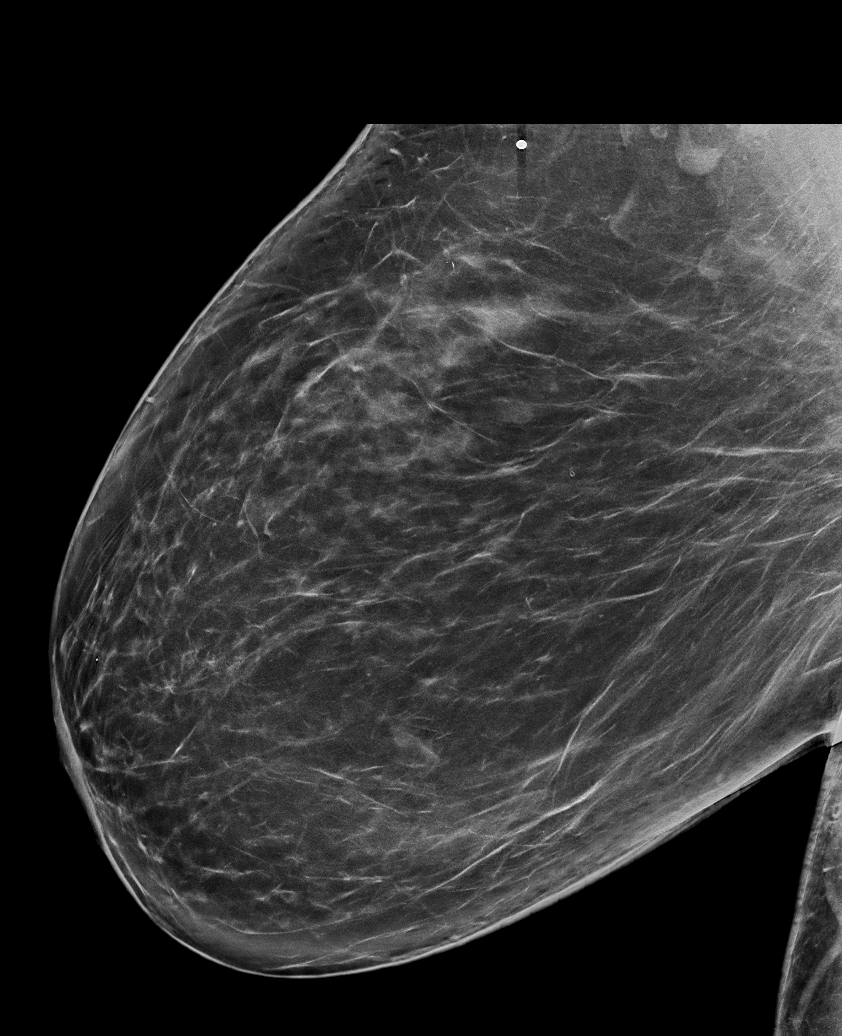

[L MLO tomo · tomo slice 49/96.0]
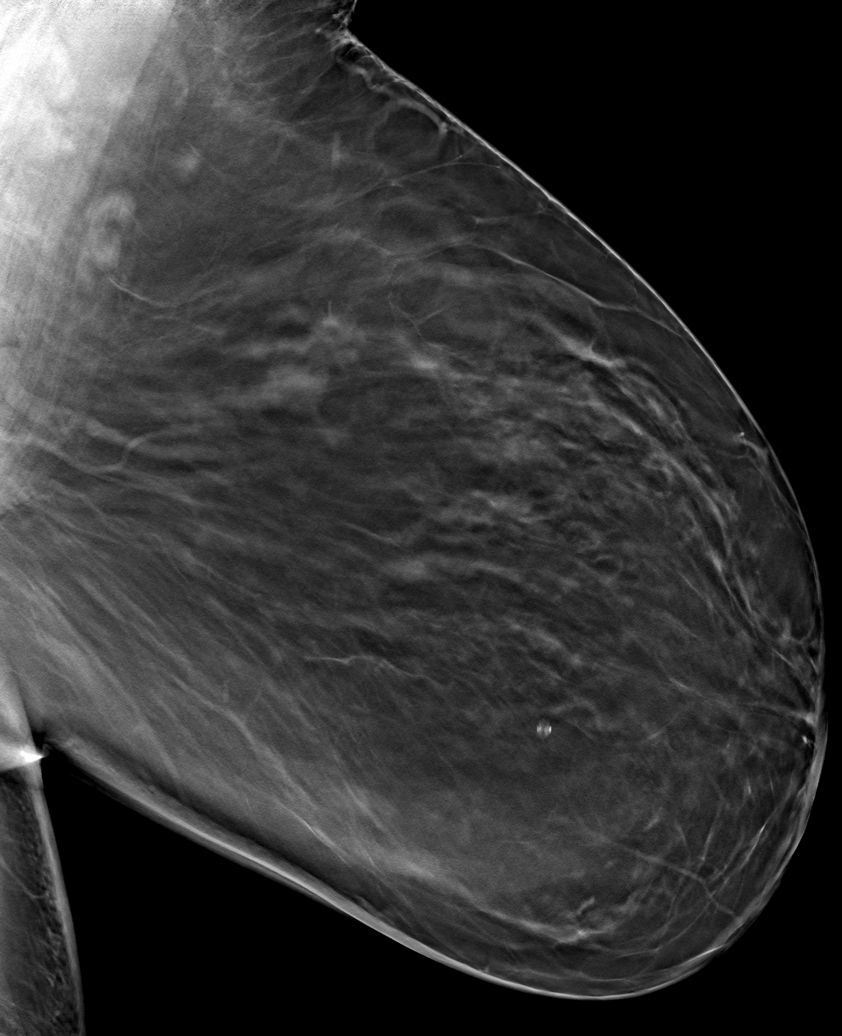

[6 of 30 positions shown; findings below may reference images not displayed]

ACR Breast Density Category b: There are scattered areas of
fibroglandular density.
FINDINGS: No suspicious masses or calcifications are seen in either breast.
Spot compression tangential tomograms were performed over the
palpable area of concern in the upper right breast with no
abnormalities identified.

Mammographic images were processed with CAD.

Physical examination at site of palpable concern in the superior
right breast does not reveal any palpable masses.

Targeted ultrasound of the right breast was performed. No suspicious
masses or abnormalities identified, only normal-appearing
fibroglandular tissue seen.
IMPRESSION: 1.  No findings of malignancy in either breast.

2. No mammographic or sonographic abnormalities at the site of
palpable concern in the superior right breast.

RECOMMENDATION:
1. Recommend further management of the right breast palpable
abnormality be based on clinical assessment.

2.  Screening mammogram in one year.(Code:ZC-L-8PW)

I have discussed the findings and recommendations with the patient.
Results were also provided in writing at the conclusion of the
visit. If applicable, a reminder letter will be sent to the patient
regarding the next appointment.

BI-RADS CATEGORY  1: Negative.

## 2018-11-15 DIAGNOSIS — J9500 Unspecified tracheostomy complication: Secondary | ICD-10-CM | POA: Diagnosis not present

## 2018-11-17 ENCOUNTER — Ambulatory Visit
Admission: RE | Admit: 2018-11-17 | Discharge: 2018-11-17 | Disposition: A | Discharge status: Home / Self Care | Payer: Medicare HMO | Source: Ambulatory Visit | Attending: Orthopedic Surgery | Admitting: Orthopedic Surgery

## 2018-11-17 DIAGNOSIS — G8929 Other chronic pain: Secondary | ICD-10-CM

## 2018-11-17 DIAGNOSIS — M25511 Pain in right shoulder: Principal | ICD-10-CM

## 2018-11-17 DIAGNOSIS — J9500 Unspecified tracheostomy complication: Secondary | ICD-10-CM | POA: Diagnosis not present

## 2018-11-17 DIAGNOSIS — S46011A Strain of muscle(s) and tendon(s) of the rotator cuff of right shoulder, initial encounter: Secondary | ICD-10-CM | POA: Diagnosis not present

## 2018-11-17 MED ORDER — IOPAMIDOL (ISOVUE-M 200) INJECTION 41%
16.0000 mL | Freq: Once | INTRAMUSCULAR | Status: AC
  Administered 2018-11-17: 16 mL via INTRA_ARTICULAR

## 2018-11-18 DIAGNOSIS — J9500 Unspecified tracheostomy complication: Secondary | ICD-10-CM | POA: Diagnosis not present

## 2018-11-22 ENCOUNTER — Ambulatory Visit: Payer: Medicare HMO | Attending: Family Medicine | Admitting: Family Medicine

## 2018-11-22 ENCOUNTER — Encounter: Payer: Self-pay | Admitting: Family Medicine

## 2018-11-22 VITALS — BP 158/107 | HR 102 | Temp 98.2°F | Ht 61.0 in | Wt 248.8 lb

## 2018-11-22 DIAGNOSIS — G8929 Other chronic pain: Secondary | ICD-10-CM

## 2018-11-22 DIAGNOSIS — E1142 Type 2 diabetes mellitus with diabetic polyneuropathy: Secondary | ICD-10-CM | POA: Diagnosis not present

## 2018-11-22 DIAGNOSIS — E7800 Pure hypercholesterolemia, unspecified: Secondary | ICD-10-CM

## 2018-11-22 DIAGNOSIS — T148XXA Other injury of unspecified body region, initial encounter: Secondary | ICD-10-CM

## 2018-11-22 DIAGNOSIS — J9500 Unspecified tracheostomy complication: Secondary | ICD-10-CM | POA: Diagnosis not present

## 2018-11-22 DIAGNOSIS — I10 Essential (primary) hypertension: Secondary | ICD-10-CM | POA: Diagnosis not present

## 2018-11-22 DIAGNOSIS — M545 Low back pain, unspecified: Secondary | ICD-10-CM

## 2018-11-22 DIAGNOSIS — J4530 Mild persistent asthma, uncomplicated: Secondary | ICD-10-CM | POA: Diagnosis not present

## 2018-11-22 DIAGNOSIS — F330 Major depressive disorder, recurrent, mild: Secondary | ICD-10-CM

## 2018-11-22 DIAGNOSIS — G43119 Migraine with aura, intractable, without status migrainosus: Secondary | ICD-10-CM

## 2018-11-22 LAB — POCT GLYCOSYLATED HEMOGLOBIN (HGB A1C): HbA1c, POC (controlled diabetic range): 12.3 % — AB (ref 0.0–7.0)

## 2018-11-22 LAB — GLUCOSE, POCT (MANUAL RESULT ENTRY): POC Glucose: 346 mg/dl — AB (ref 70–99)

## 2018-11-22 MED ORDER — FUROSEMIDE 20 MG PO TABS: 20.0000 mg | ORAL_TABLET | Freq: Every day | ORAL | 2 refills | Status: DC

## 2018-11-22 MED ORDER — BUDESONIDE-FORMOTEROL FUMARATE 160-4.5 MCG/ACT IN AERO: 2.0000 | INHALATION_SPRAY | Freq: Two times a day (BID) | RESPIRATORY_TRACT | 1 refills | Status: DC

## 2018-11-22 MED ORDER — SUMATRIPTAN SUCCINATE 100 MG PO TABS: ORAL_TABLET | ORAL | 1 refills | Status: DC

## 2018-11-22 MED ORDER — LISINOPRIL 5 MG PO TABS: 5.0000 mg | ORAL_TABLET | Freq: Every day | ORAL | 3 refills | Status: DC

## 2018-11-22 MED ORDER — SERTRALINE HCL 100 MG PO TABS: 100.0000 mg | ORAL_TABLET | Freq: Every day | ORAL | 3 refills | Status: DC

## 2018-11-22 MED ORDER — HYDROXYZINE HCL 25 MG PO TABS: 25.0000 mg | ORAL_TABLET | Freq: Three times a day (TID) | ORAL | 1 refills | Status: DC | PRN

## 2018-11-22 MED ORDER — ATORVASTATIN CALCIUM 20 MG PO TABS: 20.0000 mg | ORAL_TABLET | Freq: Every day | ORAL | 1 refills | Status: DC

## 2018-11-22 MED ORDER — ALBUTEROL SULFATE HFA 108 (90 BASE) MCG/ACT IN AERS: 2.0000 | INHALATION_SPRAY | Freq: Four times a day (QID) | RESPIRATORY_TRACT | 3 refills | Status: DC | PRN

## 2018-11-22 MED ORDER — CARVEDILOL 25 MG PO TABS: 25.0000 mg | ORAL_TABLET | Freq: Two times a day (BID) | ORAL | 3 refills | Status: DC

## 2018-11-22 MED ORDER — MONTELUKAST SODIUM 10 MG PO TABS: 10.0000 mg | ORAL_TABLET | Freq: Every day | ORAL | 1 refills | Status: DC

## 2018-11-22 MED ORDER — TIZANIDINE HCL 4 MG PO TABS: 4.0000 mg | ORAL_TABLET | Freq: Three times a day (TID) | ORAL | 3 refills | Status: DC | PRN

## 2018-11-22 MED ORDER — BUPROPION HCL ER (SR) 150 MG PO TB12: 150.0000 mg | ORAL_TABLET | Freq: Two times a day (BID) | ORAL | 3 refills | Status: DC

## 2018-11-22 MED ORDER — BENZONATATE 200 MG PO CAPS: 200.0000 mg | ORAL_CAPSULE | Freq: Three times a day (TID) | ORAL | 0 refills | Status: DC | PRN

## 2018-11-22 MED ORDER — AMLODIPINE BESYLATE 10 MG PO TABS: 10.0000 mg | ORAL_TABLET | Freq: Every day | ORAL | 1 refills | Status: DC
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
## 2018-11-22 NOTE — Progress Notes (Signed)
Patient did an MRI and woke up the next morning with bruise on shoulder.

## 2018-11-22 NOTE — Patient Instructions (Signed)
Diabetes Mellitus and Nutrition, Adult  When you have diabetes (diabetes mellitus), it is very important to have healthy eating habits because your blood sugar (glucose) levels are greatly affected by what you eat and drink. Eating healthy foods in the appropriate amounts, at about the same times every day, can help you:  · Control your blood glucose.  · Lower your risk of heart disease.  · Improve your blood pressure.  · Reach or maintain a healthy weight.  Every person with diabetes is different, and each person has different needs for a meal plan. Your health care provider may recommend that you work with a diet and nutrition specialist (dietitian) to make a meal plan that is best for you. Your meal plan may vary depending on factors such as:  · The calories you need.  · The medicines you take.  · Your weight.  · Your blood glucose, blood pressure, and cholesterol levels.  · Your activity level.  · Other health conditions you have, such as heart or kidney disease.  How do carbohydrates affect me?  Carbohydrates, also called carbs, affect your blood glucose level more than any other type of food. Eating carbs naturally raises the amount of glucose in your blood. Carb counting is a method for keeping track of how many carbs you eat. Counting carbs is important to keep your blood glucose at a healthy level, especially if you use insulin or take certain oral diabetes medicines.  It is important to know how many carbs you can safely have in each meal. This is different for every person. Your dietitian can help you calculate how many carbs you should have at each meal and for each snack.  Foods that contain carbs include:  · Bread, cereal, rice, pasta, and crackers.  · Potatoes and corn.  · Peas, beans, and lentils.  · Milk and yogurt.  · Fruit and juice.  · Desserts, such as cakes, cookies, ice cream, and candy.  How does alcohol affect me?  Alcohol can cause a sudden decrease in blood glucose (hypoglycemia),  especially if you use insulin or take certain oral diabetes medicines. Hypoglycemia can be a life-threatening condition. Symptoms of hypoglycemia (sleepiness, dizziness, and confusion) are similar to symptoms of having too much alcohol.  If your health care provider says that alcohol is safe for you, follow these guidelines:  · Limit alcohol intake to no more than 1 drink per day for nonpregnant women and 2 drinks per day for men. One drink equals 12 oz of beer, 5 oz of wine, or 1½ oz of hard liquor.  · Do not drink on an empty stomach.  · Keep yourself hydrated with water, diet soda, or unsweetened iced tea.  · Keep in mind that regular soda, juice, and other mixers may contain a lot of sugar and must be counted as carbs.  What are tips for following this plan?    Reading food labels  · Start by checking the serving size on the "Nutrition Facts" label of packaged foods and drinks. The amount of calories, carbs, fats, and other nutrients listed on the label is based on one serving of the item. Many items contain more than one serving per package.  · Check the total grams (g) of carbs in one serving. You can calculate the number of servings of carbs in one serving by dividing the total carbs by 15. For example, if a food has 30 g of total carbs, it would be equal to 2   servings of carbs.  · Check the number of grams (g) of saturated and trans fats in one serving. Choose foods that have low or no amount of these fats.  · Check the number of milligrams (mg) of salt (sodium) in one serving. Most people should limit total sodium intake to less than 2,300 mg per day.  · Always check the nutrition information of foods labeled as "low-fat" or "nonfat". These foods may be higher in added sugar or refined carbs and should be avoided.  · Talk to your dietitian to identify your daily goals for nutrients listed on the label.  Shopping  · Avoid buying canned, premade, or processed foods. These foods tend to be high in fat, sodium,  and added sugar.  · Shop around the outside edge of the grocery store. This includes fresh fruits and vegetables, bulk grains, fresh meats, and fresh dairy.  Cooking  · Use low-heat cooking methods, such as baking, instead of high-heat cooking methods like deep frying.  · Cook using healthy oils, such as olive, canola, or sunflower oil.  · Avoid cooking with butter, cream, or high-fat meats.  Meal planning  · Eat meals and snacks regularly, preferably at the same times every day. Avoid going long periods of time without eating.  · Eat foods high in fiber, such as fresh fruits, vegetables, beans, and whole grains. Talk to your dietitian about how many servings of carbs you can eat at each meal.  · Eat 4-6 ounces (oz) of lean protein each day, such as lean meat, chicken, fish, eggs, or tofu. One oz of lean protein is equal to:  ? 1 oz of meat, chicken, or fish.  ? 1 egg.  ? ¼ cup of tofu.  · Eat some foods each day that contain healthy fats, such as avocado, nuts, seeds, and fish.  Lifestyle  · Check your blood glucose regularly.  · Exercise regularly as told by your health care provider. This may include:  ? 150 minutes of moderate-intensity or vigorous-intensity exercise each week. This could be brisk walking, biking, or water aerobics.  ? Stretching and doing strength exercises, such as yoga or weightlifting, at least 2 times a week.  · Take medicines as told by your health care provider.  · Do not use any products that contain nicotine or tobacco, such as cigarettes and e-cigarettes. If you need help quitting, ask your health care provider.  · Work with a counselor or diabetes educator to identify strategies to manage stress and any emotional and social challenges.  Questions to ask a health care provider  · Do I need to meet with a diabetes educator?  · Do I need to meet with a dietitian?  · What number can I call if I have questions?  · When are the best times to check my blood glucose?  Where to find more  information:  · American Diabetes Association: diabetes.org  · Academy of Nutrition and Dietetics: www.eatright.org  · National Institute of Diabetes and Digestive and Kidney Diseases (NIH): www.niddk.nih.gov  Summary  · A healthy meal plan will help you control your blood glucose and maintain a healthy lifestyle.  · Working with a diet and nutrition specialist (dietitian) can help you make a meal plan that is best for you.  · Keep in mind that carbohydrates (carbs) and alcohol have immediate effects on your blood glucose levels. It is important to count carbs and to use alcohol carefully.  This information is not intended to   replace advice given to you by your health care provider. Make sure you discuss any questions you have with your health care provider.  Document Released: 06/05/2005 Document Revised: 04/08/2017 Document Reviewed: 10/13/2016  Elsevier Interactive Patient Education © 2019 [employer]vier Inc.

## 2018-11-23 ENCOUNTER — Telehealth: Payer: Self-pay | Admitting: Family Medicine

## 2018-11-23 LAB — BASIC METABOLIC PANEL
BUN / CREAT RATIO: 11 (ref 9–23)
BUN: 12 mg/dL (ref 6–24)
CO2: 21 mmol/L (ref 20–29)
CREATININE: 1.09 mg/dL — AB (ref 0.57–1.00)
Calcium: 9.5 mg/dL (ref 8.7–10.2)
Chloride: 103 mmol/L (ref 96–106)
GFR calc Af Amer: 69 mL/min/{1.73_m2} (ref >59)
GFR calc non Af Amer: 60 mL/min/{1.73_m2} (ref >59)
Glucose: 349 mg/dL — ABNORMAL HIGH (ref 65–99)
Potassium: 4.2 mmol/L (ref 3.5–5.2)
Sodium: 138 mmol/L (ref 134–144)

## 2018-11-23 MED ORDER — AMOXICILLIN 500 MG PO CAPS: 500.0000 mg | ORAL_CAPSULE | Freq: Three times a day (TID) | ORAL | 0 refills | Status: DC

## 2018-11-23 MED ORDER — IBUPROFEN 600 MG PO TABS: 600.0000 mg | ORAL_TABLET | Freq: Three times a day (TID) | ORAL | 0 refills | Status: DC | PRN

## 2018-11-23 NOTE — Telephone Encounter (Signed)
I called [name] to verify she understood the need to increase her current dose of Tresiba to 135 units daily.  She complained of toothache and severe headache.  Amoxicillin and ibuprofen will be sent to her pharmacy and she has been advised to make an appointment with a dentist for evaluation of her tooth.

## 2018-11-24 ENCOUNTER — Ambulatory Visit (INDEPENDENT_AMBULATORY_CARE_PROVIDER_SITE_OTHER): Payer: Medicare Other | Admitting: Orthopedic Surgery

## 2018-11-24 DIAGNOSIS — J9500 Unspecified tracheostomy complication: Secondary | ICD-10-CM | POA: Diagnosis not present

## 2018-11-25 DIAGNOSIS — J9500 Unspecified tracheostomy complication: Secondary | ICD-10-CM | POA: Diagnosis not present

## 2018-11-29 DIAGNOSIS — J9500 Unspecified tracheostomy complication: Secondary | ICD-10-CM | POA: Diagnosis not present

## 2018-12-01 DIAGNOSIS — J9500 Unspecified tracheostomy complication: Secondary | ICD-10-CM | POA: Diagnosis not present

## 2018-12-02 DIAGNOSIS — J9500 Unspecified tracheostomy complication: Secondary | ICD-10-CM | POA: Diagnosis not present

## 2018-12-06 ENCOUNTER — Telehealth (INDEPENDENT_AMBULATORY_CARE_PROVIDER_SITE_OTHER): Payer: Self-pay | Admitting: Orthopedic Surgery

## 2018-12-06 DIAGNOSIS — J9500 Unspecified tracheostomy complication: Secondary | ICD-10-CM | POA: Diagnosis not present

## 2018-12-06 NOTE — Telephone Encounter (Signed)
Patient left a voicemail message wanting to know if Dr. Marlou Sa would call her with the results of her MRI.  She currently has a cold and does not feel comfortable sitting in the waiting room with a bunch of other people.  [phone].  Thank you.

## 2018-12-06 NOTE — Telephone Encounter (Signed)
Please advise. Thanks.  

## 2018-12-07 NOTE — Telephone Encounter (Signed)
IC patient. Advised per Dr Marlou Sa. She will call us when she feels better to schedule an appt to discuss with Dr Marlou Sa.

## 2018-12-07 NOTE — Telephone Encounter (Signed)
Bursitis partial thickness rct and significant ac oa - may need surgery or another shot pls call thx

## 2018-12-08 ENCOUNTER — Ambulatory Visit (INDEPENDENT_AMBULATORY_CARE_PROVIDER_SITE_OTHER): Payer: Medicaid Other | Admitting: Orthopedic Surgery

## 2018-12-08 DIAGNOSIS — J9500 Unspecified tracheostomy complication: Secondary | ICD-10-CM | POA: Diagnosis not present

## 2018-12-09 DIAGNOSIS — J9500 Unspecified tracheostomy complication: Secondary | ICD-10-CM | POA: Diagnosis not present

## 2018-12-13 DIAGNOSIS — J9500 Unspecified tracheostomy complication: Secondary | ICD-10-CM | POA: Diagnosis not present

## 2018-12-15 DIAGNOSIS — J9500 Unspecified tracheostomy complication: Secondary | ICD-10-CM | POA: Diagnosis not present

## 2018-12-16 DIAGNOSIS — J9500 Unspecified tracheostomy complication: Secondary | ICD-10-CM | POA: Diagnosis not present

## 2018-12-20 DIAGNOSIS — J9500 Unspecified tracheostomy complication: Secondary | ICD-10-CM | POA: Diagnosis not present

## 2018-12-22 DIAGNOSIS — J9500 Unspecified tracheostomy complication: Secondary | ICD-10-CM | POA: Diagnosis not present

## 2018-12-23 DIAGNOSIS — J9500 Unspecified tracheostomy complication: Secondary | ICD-10-CM | POA: Diagnosis not present

## 2018-12-27 DIAGNOSIS — J9500 Unspecified tracheostomy complication: Secondary | ICD-10-CM | POA: Diagnosis not present

## 2018-12-28 ENCOUNTER — Telehealth (INDEPENDENT_AMBULATORY_CARE_PROVIDER_SITE_OTHER): Payer: Self-pay | Admitting: *Deleted

## 2018-12-28 NOTE — Telephone Encounter (Signed)
Asked pt covid-19 pre screening questions and they answered no to all questions.

## 2018-12-29 ENCOUNTER — Other Ambulatory Visit: Payer: Self-pay

## 2018-12-29 ENCOUNTER — Ambulatory Visit (INDEPENDENT_AMBULATORY_CARE_PROVIDER_SITE_OTHER): Payer: Medicare HMO | Admitting: Orthopedic Surgery

## 2018-12-29 ENCOUNTER — Encounter (INDEPENDENT_AMBULATORY_CARE_PROVIDER_SITE_OTHER): Payer: Self-pay | Admitting: Orthopedic Surgery

## 2018-12-29 DIAGNOSIS — M19011 Primary osteoarthritis, right shoulder: Secondary | ICD-10-CM | POA: Diagnosis not present

## 2018-12-29 DIAGNOSIS — J45909 Unspecified asthma, uncomplicated: Secondary | ICD-10-CM | POA: Diagnosis not present

## 2018-12-29 DIAGNOSIS — M75121 Complete rotator cuff tear or rupture of right shoulder, not specified as traumatic: Secondary | ICD-10-CM

## 2018-12-29 DIAGNOSIS — J449 Chronic obstructive pulmonary disease, unspecified: Secondary | ICD-10-CM | POA: Diagnosis not present

## 2018-12-29 DIAGNOSIS — M6281 Muscle weakness (generalized): Secondary | ICD-10-CM | POA: Diagnosis not present

## 2018-12-29 DIAGNOSIS — Z930 Tracheostomy status: Secondary | ICD-10-CM | POA: Diagnosis not present

## 2018-12-29 DIAGNOSIS — J9601 Acute respiratory failure with hypoxia: Secondary | ICD-10-CM | POA: Diagnosis not present

## 2018-12-29 DIAGNOSIS — J9500 Unspecified tracheostomy complication: Secondary | ICD-10-CM | POA: Diagnosis not present

## 2018-12-29 DIAGNOSIS — G4733 Obstructive sleep apnea (adult) (pediatric): Secondary | ICD-10-CM | POA: Diagnosis not present
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
## 2018-12-30 DIAGNOSIS — J9500 Unspecified tracheostomy complication: Secondary | ICD-10-CM | POA: Diagnosis not present

## 2019-01-03 DIAGNOSIS — J9500 Unspecified tracheostomy complication: Secondary | ICD-10-CM | POA: Diagnosis not present

## 2019-01-05 DIAGNOSIS — J9500 Unspecified tracheostomy complication: Secondary | ICD-10-CM | POA: Diagnosis not present

## 2019-01-06 DIAGNOSIS — J9500 Unspecified tracheostomy complication: Secondary | ICD-10-CM | POA: Diagnosis not present

## 2019-01-07 ENCOUNTER — Telehealth (INDEPENDENT_AMBULATORY_CARE_PROVIDER_SITE_OTHER): Payer: Self-pay | Admitting: Orthopedic Surgery

## 2019-01-07 ENCOUNTER — Telehealth: Payer: Self-pay | Admitting: Family Medicine

## 2019-01-07 MED ORDER — TRAMADOL HCL 50 MG PO TABS: 50.0000 mg | ORAL_TABLET | Freq: Three times a day (TID) | ORAL | 0 refills | Status: DC | PRN

## 2019-01-07 NOTE — Telephone Encounter (Signed)
Patients call returned.  Patient identified by name and date of birth.  Patient complaining of back and shoulder pain.  Patient is suppose to have surgery for a torn rotor cuff issue.  Patient according to chart has called Dr. Marlou Sa asking for pain medication.  Patient states that she was not suppose to strain it but was doing laundry and fell pain when she was unloading dryer.  Patient states the antispasmodic was not giving any relief.  Patient advised to wait for a call back from Dr Marlou Sa as he could prescribe a more potent medication.  Patient advised that if Dr. Marlou Sa didn't respond that this telephone encounter would be passed to providers here and we would attempt to help her.  Patient acknowledged understanding of advise.

## 2019-01-07 NOTE — Telephone Encounter (Signed)
Please advise. Thanks.  

## 2019-01-07 NOTE — Telephone Encounter (Signed)
Patient left a message stating that she has been in a lot of pain for the last couple of days and has been taking Tylenol which is not helping, she wanted to know if Dr. Marlou Sa would send in RX for pain medication.  CB#[phone].  Thank you.

## 2019-01-07 NOTE — Telephone Encounter (Signed)
I will go with tramadol in this case 1 p.o. 3 times daily as needed pain #45 with no refills.  Do not really want to do narcotics since we will use those in the postsurgical.  Time.

## 2019-01-07 NOTE — Telephone Encounter (Signed)
Addressed by Manson Passey

## 2019-01-07 NOTE — Telephone Encounter (Signed)
New Message   Pt states she is having pain in her back and shoulder

## 2019-01-07 NOTE — Telephone Encounter (Signed)
Called to pharmacy Advised patient done.

## 2019-01-10 DIAGNOSIS — J9500 Unspecified tracheostomy complication: Secondary | ICD-10-CM | POA: Diagnosis not present

## 2019-01-12 DIAGNOSIS — J9500 Unspecified tracheostomy complication: Secondary | ICD-10-CM | POA: Diagnosis not present

## 2019-01-13 DIAGNOSIS — J9500 Unspecified tracheostomy complication: Secondary | ICD-10-CM | POA: Diagnosis not present

## 2019-01-17 DIAGNOSIS — Z930 Tracheostomy status: Secondary | ICD-10-CM | POA: Diagnosis not present

## 2019-01-17 DIAGNOSIS — J9500 Unspecified tracheostomy complication: Secondary | ICD-10-CM | POA: Diagnosis not present

## 2019-01-17 DIAGNOSIS — R69 Illness, unspecified: Secondary | ICD-10-CM | POA: Diagnosis not present

## 2019-01-17 DIAGNOSIS — J386 Stenosis of larynx: Secondary | ICD-10-CM | POA: Diagnosis not present

## 2019-01-17 DIAGNOSIS — J9509 Other tracheostomy complication: Secondary | ICD-10-CM | POA: Diagnosis not present

## 2019-01-17 DIAGNOSIS — Z87891 Personal history of nicotine dependence: Secondary | ICD-10-CM | POA: Diagnosis not present

## 2019-01-17 DIAGNOSIS — Z430 Encounter for attention to tracheostomy: Secondary | ICD-10-CM | POA: Diagnosis not present

## 2019-01-18 ENCOUNTER — Other Ambulatory Visit: Payer: Self-pay | Admitting: *Deleted

## 2019-01-19 DIAGNOSIS — J9500 Unspecified tracheostomy complication: Secondary | ICD-10-CM | POA: Diagnosis not present

## 2019-01-20 DIAGNOSIS — J9500 Unspecified tracheostomy complication: Secondary | ICD-10-CM | POA: Diagnosis not present

## 2019-01-24 DIAGNOSIS — J9500 Unspecified tracheostomy complication: Secondary | ICD-10-CM | POA: Diagnosis not present

## 2019-01-26 DIAGNOSIS — J449 Chronic obstructive pulmonary disease, unspecified: Secondary | ICD-10-CM | POA: Diagnosis not present

## 2019-01-26 DIAGNOSIS — Z930 Tracheostomy status: Secondary | ICD-10-CM | POA: Diagnosis not present

## 2019-01-26 DIAGNOSIS — J45909 Unspecified asthma, uncomplicated: Secondary | ICD-10-CM | POA: Diagnosis not present

## 2019-01-26 DIAGNOSIS — J9500 Unspecified tracheostomy complication: Secondary | ICD-10-CM | POA: Diagnosis not present

## 2019-01-26 DIAGNOSIS — J9601 Acute respiratory failure with hypoxia: Secondary | ICD-10-CM | POA: Diagnosis not present

## 2019-01-26 DIAGNOSIS — G4733 Obstructive sleep apnea (adult) (pediatric): Secondary | ICD-10-CM | POA: Diagnosis not present

## 2019-01-27 DIAGNOSIS — J9500 Unspecified tracheostomy complication: Secondary | ICD-10-CM | POA: Diagnosis not present

## 2019-01-31 DIAGNOSIS — J9500 Unspecified tracheostomy complication: Secondary | ICD-10-CM | POA: Diagnosis not present

## 2019-02-02 DIAGNOSIS — J9500 Unspecified tracheostomy complication: Secondary | ICD-10-CM | POA: Diagnosis not present

## 2019-02-03 DIAGNOSIS — J9500 Unspecified tracheostomy complication: Secondary | ICD-10-CM | POA: Diagnosis not present

## 2019-02-07 DIAGNOSIS — J9500 Unspecified tracheostomy complication: Secondary | ICD-10-CM | POA: Diagnosis not present

## 2019-02-09 DIAGNOSIS — J9500 Unspecified tracheostomy complication: Secondary | ICD-10-CM | POA: Diagnosis not present

## 2019-02-10 DIAGNOSIS — J9500 Unspecified tracheostomy complication: Secondary | ICD-10-CM | POA: Diagnosis not present

## 2019-02-14 DIAGNOSIS — J9500 Unspecified tracheostomy complication: Secondary | ICD-10-CM | POA: Diagnosis not present

## 2019-02-16 DIAGNOSIS — J9500 Unspecified tracheostomy complication: Secondary | ICD-10-CM | POA: Diagnosis not present

## 2019-02-16 IMAGING — XA DG FLUORO GUIDE NDL PLC/BX
2 series · 2 of 2 positions shown · non-contrast
Comparison: none

CLINICAL DATA: RIGHT shoulder pain

[Series 1: ortho standard · 1 of 1 slices shown (1 of 2)]
[im 1/1]
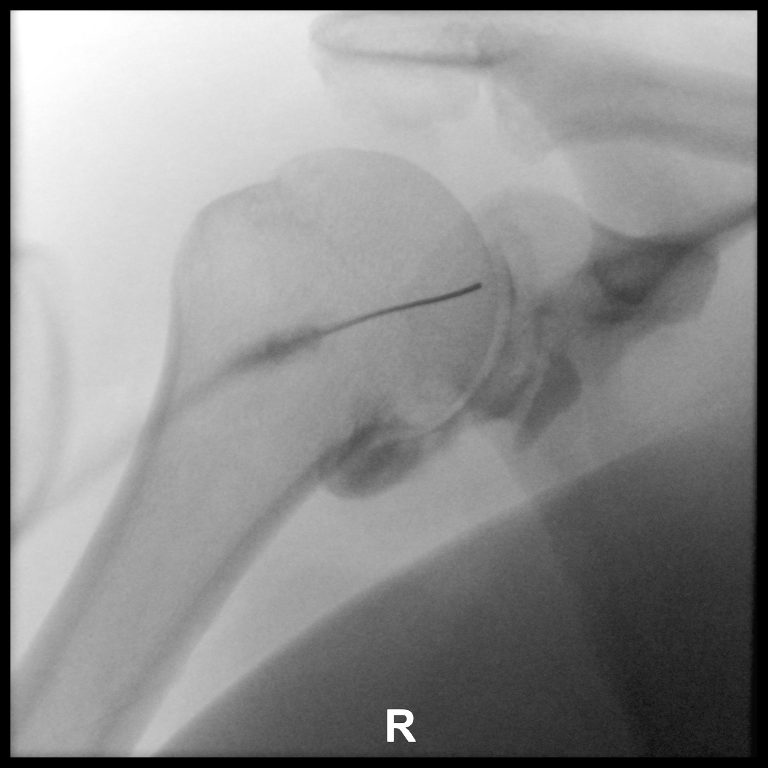

[Series 2: ortho standard · 1 of 1 slices shown (2 of 2)]
[im 1/1]
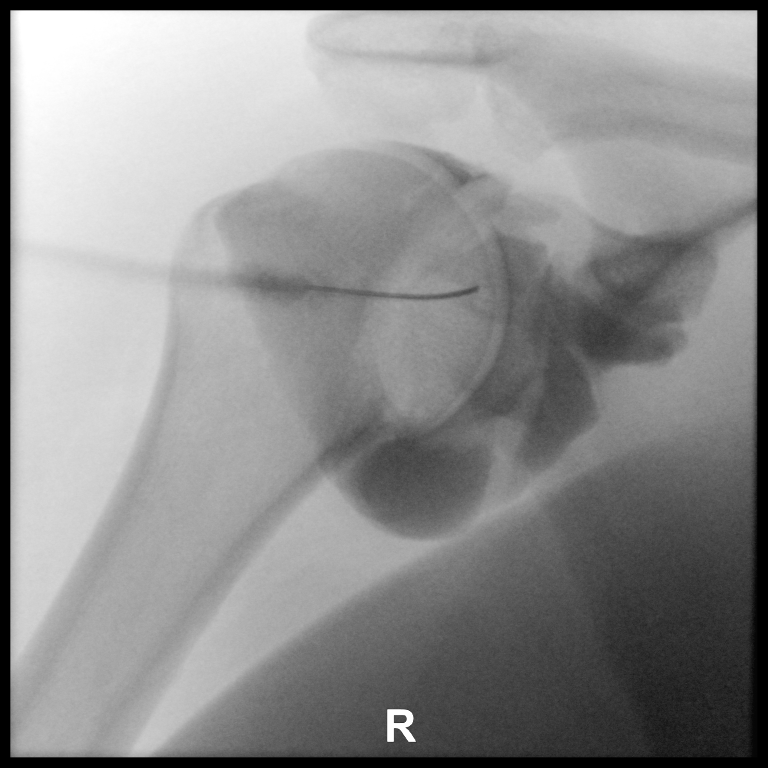

[2 of 2 positions shown; findings below may reference images not displayed]

FLUOROSCOPY TIME:  6 seconds corresponding to a Dose Area Product of
8.2 Gy*m2

PROCEDURE:
RIGHT SHOULDER INJECTION UNDER FLUOROSCOPY

Informed written consent was obtained.  Time-out was performed.

An appropriate skin entrance site was determined. The site was
marked, prepped with Betadine, draped in the usual sterile fashion,
and infiltrated locally with 1% lidocaine. 22 gauge spinal needle
was advanced to the superomedial margin of the humeral head under
intermittent fluoroscopy. 1 ml of Lidocaine injected easily. A
mixture of 0.1 ml Multihance and 20 ml of dilute Isovue M 200 mixed
50-50 with sterile saline was then used to opacify the RIGHT
shoulder capsule. No immediate complication.
IMPRESSION: Technically successful RIGHT shoulder injection for MRI.

## 2019-02-16 IMAGING — MR MR SHOULDER*R* W/CM
4 of 6 series · 13 of 40 positions shown · IV contrast (agent unspecified)
Comparison: Right shoulder x-rays dated June 25, 2018.

CLINICAL DATA: Chronic right shoulder pain and weakness with
limited range of motion

EXAM:
MR ARTHROGRAM OF THE RIGHT SHOULDER
TECHNIQUE: Multiplanar, multisequence MR imaging of the right shoulder was
performed following the administration of intra-articular contrast.
CONTRAST:  See Injection Documentation.

[Series 6: T1 fat-sat · axial · right · 3.0mm · 0.36mm/px · z∈[-18,+51]mm · 3 of 25 slices shown (1 of 2)]
[im 5/25]
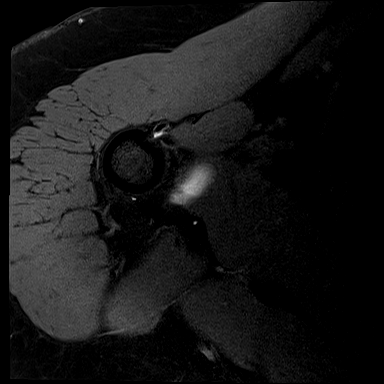
[im 15/25]
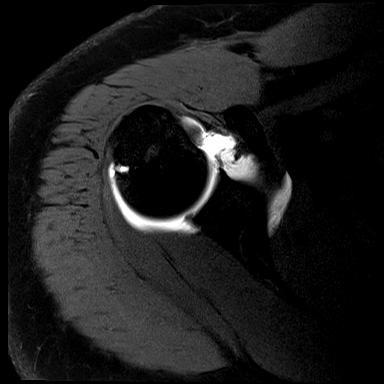
[im 25/25]
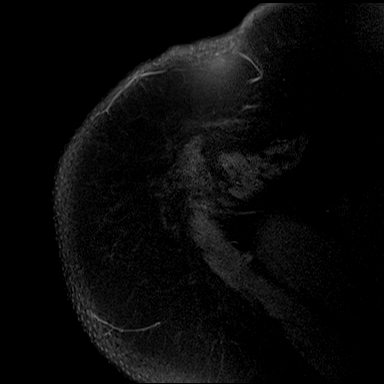

[Series 7: T2 fat-sat · oblique · right · 3.0mm · 0.22mm/px · 4 of 25 slices shown (1 of 2)]
[im 1/25]
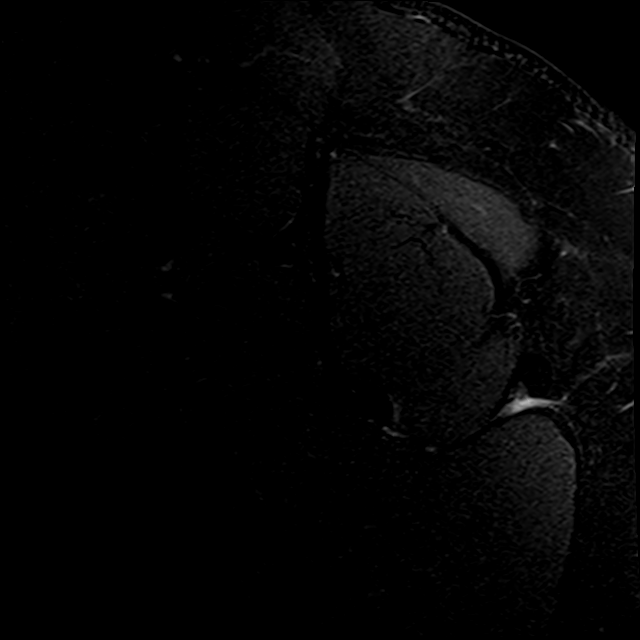
[im 5/25]
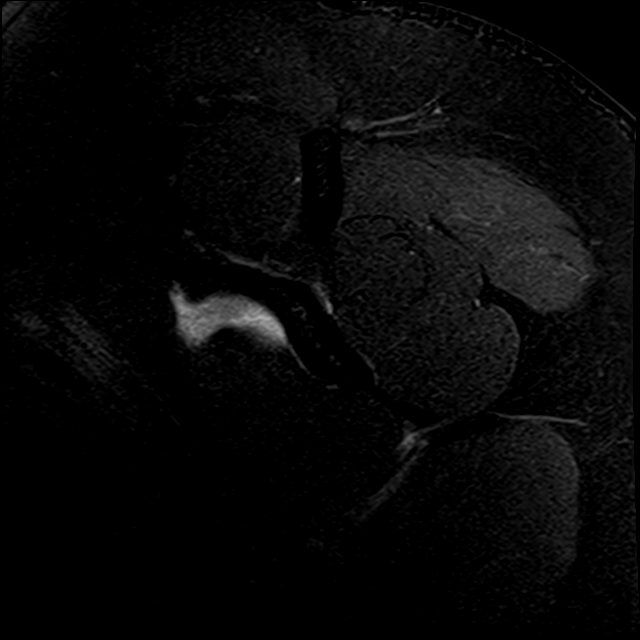
[im 13/25]
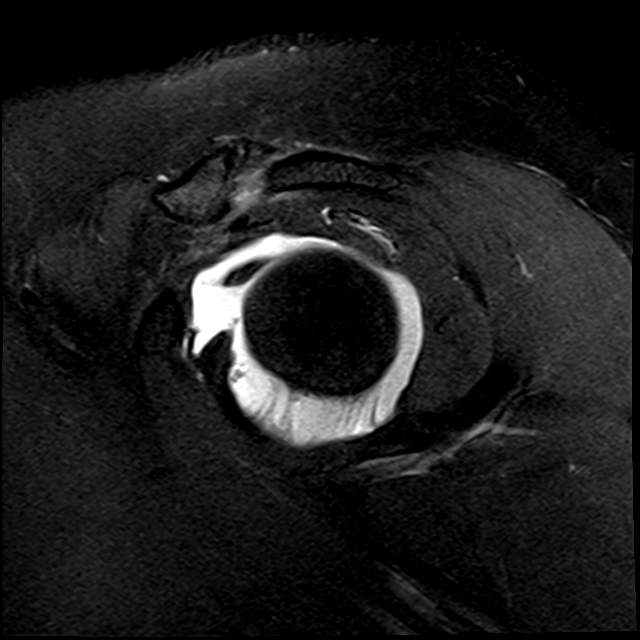
[im 21/25]
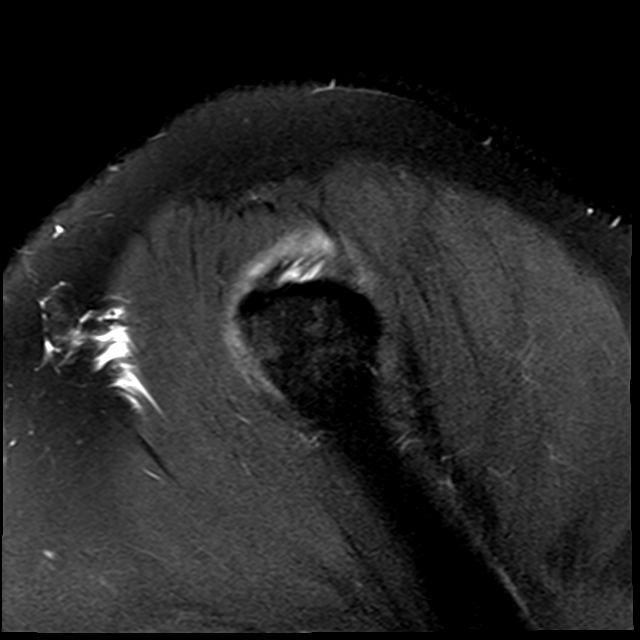

[Series 8: T1 fat-sat · oblique · right · 3.0mm · 0.18mm/px · 3 of 24 slices shown (2 of 2)]
[im 4/24]
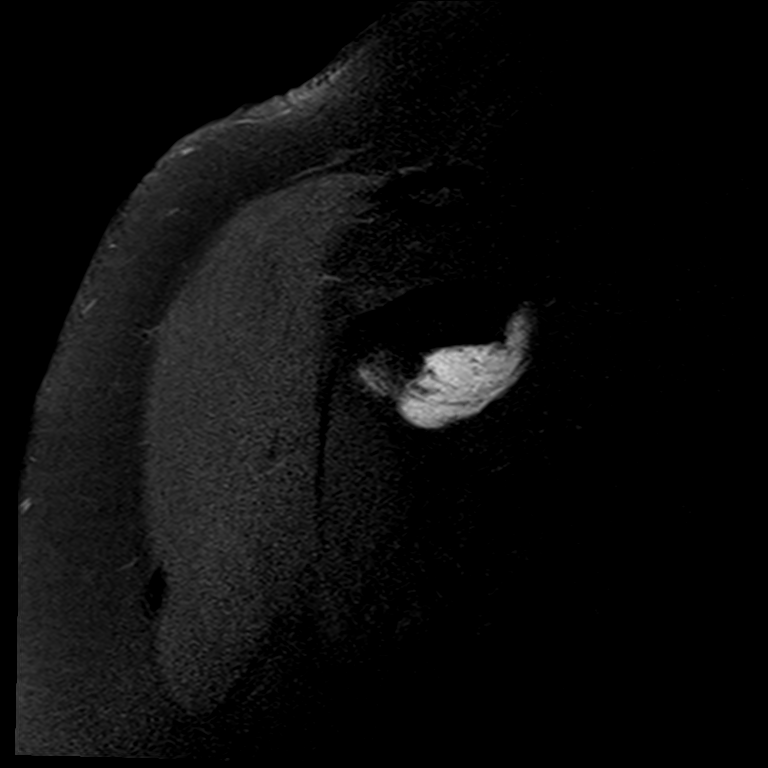
[im 12/24]
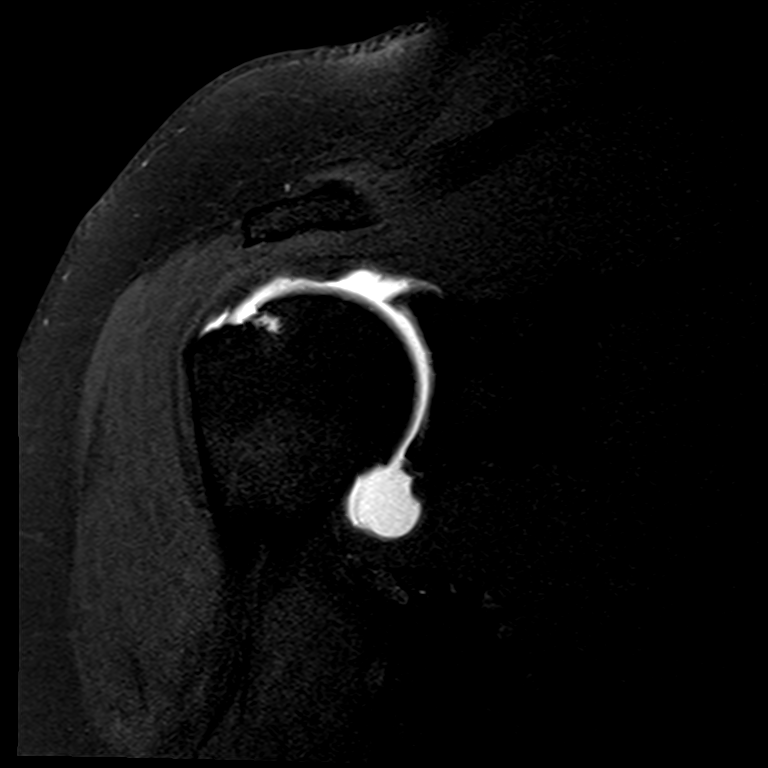
[im 20/24]
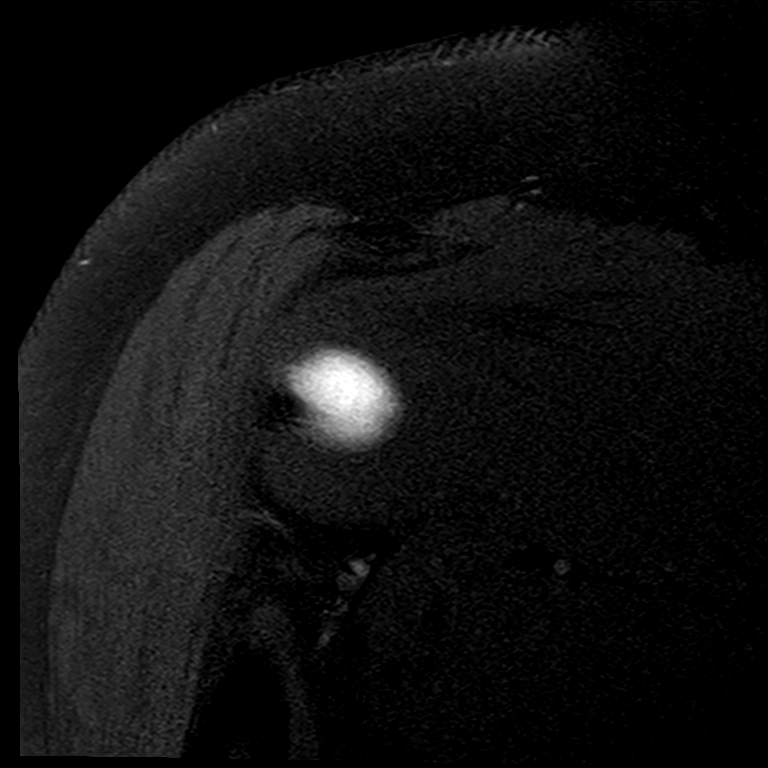

[Series 10: T2 fat-sat · oblique · right · 3.0mm · 0.22mm/px · 3 of 24 slices shown (2 of 2)]
[im 4/24]
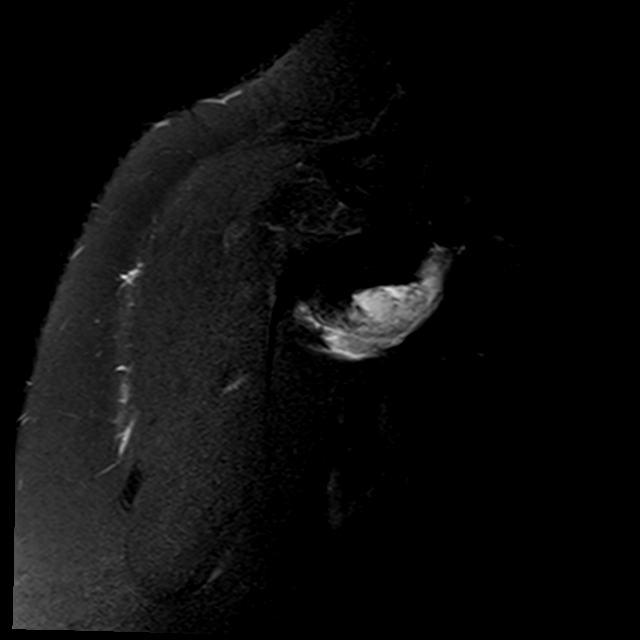
[im 12/24]
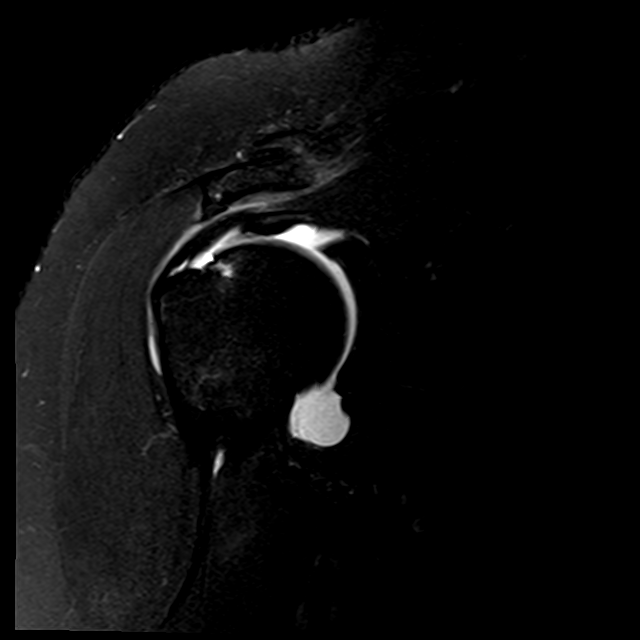
[im 20/24]
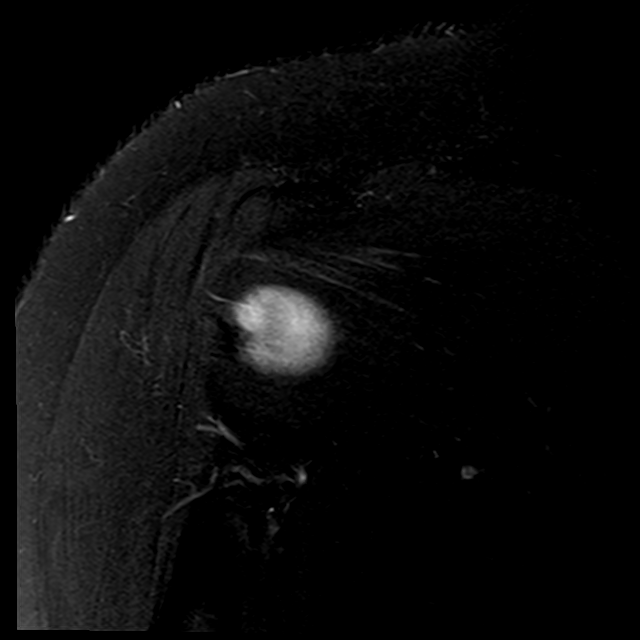

[13 of 40 positions shown; findings below may reference images not displayed]

FINDINGS: Rotator cuff: Partial thickness articular surface tear of the distal
supraspinatus tendon extending into the conjoined fibers of the
anterior infraspinatus tendon. The tear then extends interstitially
along the infraspinatus myotendinous junction. The infraspinatus and
subscapularis tendons are unremarkable.

Muscles:  No focal muscular atrophy or edema.

Biceps long head:  Intact and normally positioned.

Acromioclavicular Joint: The acromion is type II. There are
mild-to-moderate acromioclavicular degenerative changes. There is a
small amount of fluid, but no contrast within the
subacromial/subdeltoid bursa.

Glenohumeral Joint: Distended with intra-articular contrast. No
chondral defect.

Labrum:  No evidence of labral tear.

Bones: Reactive cystic changes in the greater tuberosity. No acute
fracture or dislocation. No suspicious bone lesion.

Other: No significant soft tissue findings.
IMPRESSION: 1. High-grade partial-thickness articular surface tear of the distal
supraspinatus tendon extending into the anterior infraspinatus
tendon. Additional delaminating interstitial component extends
proximally to the infraspinatus myotendinous junction. No muscle
atrophy.
2. Mild to moderate acromioclavicular osteoarthritis.
3. Mild subacromial/subdeltoid bursitis.

## 2019-02-17 DIAGNOSIS — J9500 Unspecified tracheostomy complication: Secondary | ICD-10-CM | POA: Diagnosis not present

## 2019-02-21 DIAGNOSIS — J9500 Unspecified tracheostomy complication: Secondary | ICD-10-CM | POA: Diagnosis not present

## 2019-02-23 DIAGNOSIS — J9500 Unspecified tracheostomy complication: Secondary | ICD-10-CM | POA: Diagnosis not present

## 2019-02-24 DIAGNOSIS — J9500 Unspecified tracheostomy complication: Secondary | ICD-10-CM | POA: Diagnosis not present

## 2019-02-28 ENCOUNTER — Ambulatory Visit: Payer: Medicare HMO | Admitting: Family Medicine

## 2019-02-28 DIAGNOSIS — J9601 Acute respiratory failure with hypoxia: Secondary | ICD-10-CM | POA: Diagnosis not present

## 2019-02-28 DIAGNOSIS — J449 Chronic obstructive pulmonary disease, unspecified: Secondary | ICD-10-CM | POA: Diagnosis not present

## 2019-02-28 DIAGNOSIS — Z930 Tracheostomy status: Secondary | ICD-10-CM | POA: Diagnosis not present

## 2019-02-28 DIAGNOSIS — G4733 Obstructive sleep apnea (adult) (pediatric): Secondary | ICD-10-CM | POA: Diagnosis not present

## 2019-02-28 DIAGNOSIS — J45909 Unspecified asthma, uncomplicated: Secondary | ICD-10-CM | POA: Diagnosis not present

## 2019-02-28 DIAGNOSIS — J9500 Unspecified tracheostomy complication: Secondary | ICD-10-CM | POA: Diagnosis not present

## 2019-03-02 DIAGNOSIS — J9500 Unspecified tracheostomy complication: Secondary | ICD-10-CM | POA: Diagnosis not present

## 2019-03-03 DIAGNOSIS — J9500 Unspecified tracheostomy complication: Secondary | ICD-10-CM | POA: Diagnosis not present

## 2019-03-07 DIAGNOSIS — J9500 Unspecified tracheostomy complication: Secondary | ICD-10-CM | POA: Diagnosis not present

## 2019-03-09 DIAGNOSIS — J9500 Unspecified tracheostomy complication: Secondary | ICD-10-CM | POA: Diagnosis not present

## 2019-03-10 DIAGNOSIS — J9500 Unspecified tracheostomy complication: Secondary | ICD-10-CM | POA: Diagnosis not present

## 2019-03-14 DIAGNOSIS — J9500 Unspecified tracheostomy complication: Secondary | ICD-10-CM | POA: Diagnosis not present

## 2019-03-16 DIAGNOSIS — J9500 Unspecified tracheostomy complication: Secondary | ICD-10-CM | POA: Diagnosis not present

## 2019-03-17 DIAGNOSIS — J9500 Unspecified tracheostomy complication: Secondary | ICD-10-CM | POA: Diagnosis not present

## 2019-03-18 DIAGNOSIS — E1142 Type 2 diabetes mellitus with diabetic polyneuropathy: Secondary | ICD-10-CM | POA: Diagnosis not present

## 2019-03-18 DIAGNOSIS — Z794 Long term (current) use of insulin: Secondary | ICD-10-CM | POA: Diagnosis not present

## 2019-03-18 DIAGNOSIS — I10 Essential (primary) hypertension: Secondary | ICD-10-CM | POA: Diagnosis not present

## 2019-03-18 DIAGNOSIS — G43909 Migraine, unspecified, not intractable, without status migrainosus: Secondary | ICD-10-CM | POA: Diagnosis not present

## 2019-03-18 DIAGNOSIS — E785 Hyperlipidemia, unspecified: Secondary | ICD-10-CM | POA: Diagnosis not present

## 2019-03-18 DIAGNOSIS — G8929 Other chronic pain: Secondary | ICD-10-CM | POA: Diagnosis not present

## 2019-03-18 DIAGNOSIS — R69 Illness, unspecified: Secondary | ICD-10-CM | POA: Diagnosis not present

## 2019-03-18 DIAGNOSIS — Z930 Tracheostomy status: Secondary | ICD-10-CM | POA: Diagnosis not present

## 2019-03-21 ENCOUNTER — Other Ambulatory Visit: Payer: Self-pay

## 2019-03-21 ENCOUNTER — Encounter (HOSPITAL_COMMUNITY): Payer: Self-pay

## 2019-03-21 ENCOUNTER — Emergency Department (HOSPITAL_COMMUNITY): Payer: Medicare HMO

## 2019-03-21 ENCOUNTER — Inpatient Hospital Stay (HOSPITAL_COMMUNITY)
Admission: EM | Admit: 2019-03-21 | Discharge: 2019-03-26 | DRG: 202 | Disposition: A | Discharge status: Home / Self Care | Payer: Medicare HMO | Attending: Internal Medicine | Admitting: Internal Medicine

## 2019-03-21 DIAGNOSIS — Z20828 Contact with and (suspected) exposure to other viral communicable diseases: Secondary | ICD-10-CM | POA: Diagnosis present

## 2019-03-21 DIAGNOSIS — Z09 Encounter for follow-up examination after completed treatment for conditions other than malignant neoplasm: Secondary | ICD-10-CM

## 2019-03-21 DIAGNOSIS — D649 Anemia, unspecified: Secondary | ICD-10-CM | POA: Diagnosis present

## 2019-03-21 DIAGNOSIS — J4521 Mild intermittent asthma with (acute) exacerbation: Secondary | ICD-10-CM | POA: Diagnosis not present

## 2019-03-21 DIAGNOSIS — I10 Essential (primary) hypertension: Secondary | ICD-10-CM | POA: Diagnosis present

## 2019-03-21 DIAGNOSIS — E872 Acidosis, unspecified: Secondary | ICD-10-CM

## 2019-03-21 DIAGNOSIS — Z87891 Personal history of nicotine dependence: Secondary | ICD-10-CM

## 2019-03-21 DIAGNOSIS — E1142 Type 2 diabetes mellitus with diabetic polyneuropathy: Secondary | ICD-10-CM | POA: Diagnosis present

## 2019-03-21 DIAGNOSIS — Z794 Long term (current) use of insulin: Secondary | ICD-10-CM

## 2019-03-21 DIAGNOSIS — E1110 Type 2 diabetes mellitus with ketoacidosis without coma: Secondary | ICD-10-CM | POA: Diagnosis present

## 2019-03-21 DIAGNOSIS — Z8249 Family history of ischemic heart disease and other diseases of the circulatory system: Secondary | ICD-10-CM

## 2019-03-21 DIAGNOSIS — Z6841 Body Mass Index (BMI) 40.0 and over, adult: Secondary | ICD-10-CM

## 2019-03-21 DIAGNOSIS — K219 Gastro-esophageal reflux disease without esophagitis: Secondary | ICD-10-CM | POA: Diagnosis present

## 2019-03-21 DIAGNOSIS — Z833 Family history of diabetes mellitus: Secondary | ICD-10-CM

## 2019-03-21 DIAGNOSIS — J398 Other specified diseases of upper respiratory tract: Secondary | ICD-10-CM | POA: Diagnosis present

## 2019-03-21 DIAGNOSIS — R0789 Other chest pain: Secondary | ICD-10-CM | POA: Diagnosis not present

## 2019-03-21 DIAGNOSIS — Z930 Tracheostomy status: Secondary | ICD-10-CM

## 2019-03-21 DIAGNOSIS — J45901 Unspecified asthma with (acute) exacerbation: Secondary | ICD-10-CM | POA: Diagnosis not present

## 2019-03-21 DIAGNOSIS — E662 Morbid (severe) obesity with alveolar hypoventilation: Secondary | ICD-10-CM | POA: Diagnosis present

## 2019-03-21 DIAGNOSIS — J441 Chronic obstructive pulmonary disease with (acute) exacerbation: Secondary | ICD-10-CM | POA: Diagnosis present

## 2019-03-21 DIAGNOSIS — R079 Chest pain, unspecified: Secondary | ICD-10-CM | POA: Diagnosis not present

## 2019-03-21 DIAGNOSIS — I119 Hypertensive heart disease without heart failure: Secondary | ICD-10-CM | POA: Diagnosis present

## 2019-03-21 DIAGNOSIS — G894 Chronic pain syndrome: Secondary | ICD-10-CM | POA: Diagnosis present

## 2019-03-21 DIAGNOSIS — Z7951 Long term (current) use of inhaled steroids: Secondary | ICD-10-CM

## 2019-03-21 DIAGNOSIS — J9610 Chronic respiratory failure, unspecified whether with hypoxia or hypercapnia: Secondary | ICD-10-CM | POA: Diagnosis present

## 2019-03-21 DIAGNOSIS — E785 Hyperlipidemia, unspecified: Secondary | ICD-10-CM | POA: Diagnosis present

## 2019-03-21 DIAGNOSIS — G4733 Obstructive sleep apnea (adult) (pediatric): Secondary | ICD-10-CM | POA: Diagnosis present

## 2019-03-21 DIAGNOSIS — F329 Major depressive disorder, single episode, unspecified: Secondary | ICD-10-CM | POA: Diagnosis present

## 2019-03-21 DIAGNOSIS — J9500 Unspecified tracheostomy complication: Secondary | ICD-10-CM | POA: Diagnosis not present

## 2019-03-21 DIAGNOSIS — J45909 Unspecified asthma, uncomplicated: Secondary | ICD-10-CM | POA: Diagnosis present

## 2019-03-21 DIAGNOSIS — Z823 Family history of stroke: Secondary | ICD-10-CM

## 2019-03-21 DIAGNOSIS — D72829 Elevated white blood cell count, unspecified: Secondary | ICD-10-CM | POA: Diagnosis present

## 2019-03-21 DIAGNOSIS — N179 Acute kidney failure, unspecified: Secondary | ICD-10-CM | POA: Diagnosis present

## 2019-03-21 DIAGNOSIS — Z79899 Other long term (current) drug therapy: Secondary | ICD-10-CM

## 2019-03-21 DIAGNOSIS — R0602 Shortness of breath: Secondary | ICD-10-CM | POA: Diagnosis not present

## 2019-03-21 LAB — I-STAT BETA HCG BLOOD, ED (MC, WL, AP ONLY): I-stat hCG, quantitative: <5 m[IU]/mL (ref <5)

## 2019-03-21 LAB — CBC
HCT: 37.2 % (ref 36.0–46.0)
Hemoglobin: 11.9 g/dL — ABNORMAL LOW (ref 12.0–15.0)
MCH: 27.9 pg (ref 26.0–34.0)
MCHC: 32 g/dL (ref 30.0–36.0)
MCV: 87.1 fL (ref 80.0–100.0)
Platelets: 310 10*3/uL (ref 150–400)
RBC: 4.27 MIL/uL (ref 3.87–5.11)
RDW: 14.1 % (ref 11.5–15.5)
WBC: 8.8 10*3/uL (ref 4.0–10.5)
nRBC: 0 % (ref 0.0–0.2)

## 2019-03-21 LAB — BASIC METABOLIC PANEL
Anion gap: 12 (ref 5–15)
BUN: 8 mg/dL (ref 6–20)
CO2: 20 mmol/L — ABNORMAL LOW (ref 22–32)
Calcium: 9 mg/dL (ref 8.9–10.3)
Chloride: 102 mmol/L (ref 98–111)
Creatinine, Ser: 1.46 mg/dL — ABNORMAL HIGH (ref 0.44–1.00)
GFR calc Af Amer: 48 mL/min — ABNORMAL LOW (ref >60)
GFR calc non Af Amer: 42 mL/min — ABNORMAL LOW (ref >60)
Glucose, Bld: 389 mg/dL — ABNORMAL HIGH (ref 70–99)
Potassium: 3.7 mmol/L (ref 3.5–5.1)
Sodium: 134 mmol/L — ABNORMAL LOW (ref 135–145)

## 2019-03-21 LAB — TROPONIN I (HIGH SENSITIVITY): Troponin I (High Sensitivity): 4 ng/L (ref <18)

## 2019-03-21 MED ORDER — SODIUM CHLORIDE 0.9% FLUSH: 3.0000 mL | Freq: Once | INTRAVENOUS | Status: DC

## 2019-03-21 NOTE — ED Triage Notes (Signed)
Pt from home c.o SOB/CP, pt has trach in place. Oxygen 98% on room air at this time. Pt sounds junky

## 2019-03-22 ENCOUNTER — Encounter (HOSPITAL_COMMUNITY): Payer: Self-pay

## 2019-03-22 ENCOUNTER — Emergency Department (HOSPITAL_COMMUNITY): Payer: Medicare HMO

## 2019-03-22 DIAGNOSIS — R0902 Hypoxemia: Secondary | ICD-10-CM | POA: Diagnosis not present

## 2019-03-22 DIAGNOSIS — E872 Acidosis, unspecified: Secondary | ICD-10-CM

## 2019-03-22 DIAGNOSIS — R079 Chest pain, unspecified: Secondary | ICD-10-CM | POA: Diagnosis present

## 2019-03-22 DIAGNOSIS — J45901 Unspecified asthma with (acute) exacerbation: Secondary | ICD-10-CM | POA: Diagnosis present

## 2019-03-22 DIAGNOSIS — R0602 Shortness of breath: Secondary | ICD-10-CM | POA: Diagnosis not present

## 2019-03-22 DIAGNOSIS — R0789 Other chest pain: Secondary | ICD-10-CM | POA: Diagnosis not present

## 2019-03-22 DIAGNOSIS — Z930 Tracheostomy status: Secondary | ICD-10-CM | POA: Diagnosis not present

## 2019-03-22 HISTORY — DX: Chest pain, unspecified: R07.9

## 2019-03-22 LAB — POCT I-STAT 7, (LYTES, BLD GAS, ICA,H+H)
Acid-base deficit: 4 mmol/L — ABNORMAL HIGH (ref 0.0–2.0)
Bicarbonate: 20.4 mmol/L (ref 20.0–28.0)
Calcium, Ion: 1.21 mmol/L (ref 1.15–1.40)
HCT: 35 % — ABNORMAL LOW (ref 36.0–46.0)
Hemoglobin: 11.9 g/dL — ABNORMAL LOW (ref 12.0–15.0)
O2 Saturation: 99 %
Patient temperature: 98.7
Potassium: 3.3 mmol/L — ABNORMAL LOW (ref 3.5–5.1)
Sodium: 141 mmol/L (ref 135–145)
TCO2: 22 mmol/L (ref 22–32)
pCO2 arterial: 35.8 mmHg (ref 32.0–48.0)
pH, Arterial: 7.364 (ref 7.350–7.450)
pO2, Arterial: 161 mmHg — ABNORMAL HIGH (ref 83.0–108.0)

## 2019-03-22 LAB — BASIC METABOLIC PANEL
Anion gap: 10 (ref 5–15)
Anion gap: 9 (ref 5–15)
BUN: 16 mg/dL (ref 6–20)
BUN: 17 mg/dL (ref 6–20)
CO2: 18 mmol/L — ABNORMAL LOW (ref 22–32)
CO2: 19 mmol/L — ABNORMAL LOW (ref 22–32)
Calcium: 8.4 mg/dL — ABNORMAL LOW (ref 8.9–10.3)
Calcium: 8.6 mg/dL — ABNORMAL LOW (ref 8.9–10.3)
Chloride: 108 mmol/L (ref 98–111)
Chloride: 110 mmol/L (ref 98–111)
Creatinine, Ser: 1.37 mg/dL — ABNORMAL HIGH (ref 0.44–1.00)
Creatinine, Ser: 1.12 mg/dL — ABNORMAL HIGH (ref 0.44–1.00)
GFR calc Af Amer: 52 mL/min — ABNORMAL LOW (ref >60)
GFR calc Af Amer: >60 mL/min (ref >60)
GFR calc non Af Amer: 45 mL/min — ABNORMAL LOW (ref >60)
GFR calc non Af Amer: 58 mL/min — ABNORMAL LOW (ref >60)
Glucose, Bld: 295 mg/dL — ABNORMAL HIGH (ref 70–99)
Glucose, Bld: 202 mg/dL — ABNORMAL HIGH (ref 70–99)
Potassium: 4.1 mmol/L (ref 3.5–5.1)
Potassium: 4.3 mmol/L (ref 3.5–5.1)
Sodium: 136 mmol/L (ref 135–145)
Sodium: 138 mmol/L (ref 135–145)

## 2019-03-22 LAB — URINALYSIS, ROUTINE W REFLEX MICROSCOPIC
Bacteria, UA: NONE SEEN
Bilirubin Urine: NEGATIVE
Glucose, UA: >=500 mg/dL — AB
Hgb urine dipstick: NEGATIVE
Ketones, ur: 5 mg/dL — AB
Leukocytes,Ua: NEGATIVE
Nitrite: NEGATIVE
Protein, ur: NEGATIVE mg/dL
Specific Gravity, Urine: 1.039 — ABNORMAL HIGH (ref 1.005–1.030)
pH: 5 (ref 5.0–8.0)

## 2019-03-22 LAB — HEPATIC FUNCTION PANEL
ALT: 31 U/L (ref 0–44)
AST: 30 U/L (ref 15–41)
Albumin: 3.7 g/dL (ref 3.5–5.0)
Alkaline Phosphatase: 107 U/L (ref 38–126)
Bilirubin, Direct: <0.1 mg/dL (ref 0.0–0.2)
Total Bilirubin: 0.5 mg/dL (ref 0.3–1.2)
Total Protein: 7.9 g/dL (ref 6.5–8.1)

## 2019-03-22 LAB — GLUCOSE, CAPILLARY
Glucose-Capillary: 175 mg/dL — ABNORMAL HIGH (ref 70–99)
Glucose-Capillary: 189 mg/dL — ABNORMAL HIGH (ref 70–99)
Glucose-Capillary: 204 mg/dL — ABNORMAL HIGH (ref 70–99)
Glucose-Capillary: 251 mg/dL — ABNORMAL HIGH (ref 70–99)
Glucose-Capillary: 299 mg/dL — ABNORMAL HIGH (ref 70–99)
Glucose-Capillary: 308 mg/dL — ABNORMAL HIGH (ref 70–99)
Glucose-Capillary: 375 mg/dL — ABNORMAL HIGH (ref 70–99)
Glucose-Capillary: 383 mg/dL — ABNORMAL HIGH (ref 70–99)
Glucose-Capillary: 385 mg/dL — ABNORMAL HIGH (ref 70–99)
Glucose-Capillary: 437 mg/dL — ABNORMAL HIGH (ref 70–99)

## 2019-03-22 LAB — COMPREHENSIVE METABOLIC PANEL
ALT: 29 U/L (ref 0–44)
AST: 26 U/L (ref 15–41)
Albumin: 3.4 g/dL — ABNORMAL LOW (ref 3.5–5.0)
Alkaline Phosphatase: 99 U/L (ref 38–126)
Anion gap: 18 — ABNORMAL HIGH (ref 5–15)
BUN: 12 mg/dL (ref 6–20)
CO2: 15 mmol/L — ABNORMAL LOW (ref 22–32)
Calcium: 8.3 mg/dL — ABNORMAL LOW (ref 8.9–10.3)
Chloride: 102 mmol/L (ref 98–111)
Creatinine, Ser: 1.39 mg/dL — ABNORMAL HIGH (ref 0.44–1.00)
GFR calc Af Amer: 51 mL/min — ABNORMAL LOW (ref >60)
GFR calc non Af Amer: 44 mL/min — ABNORMAL LOW (ref >60)
Glucose, Bld: 425 mg/dL — ABNORMAL HIGH (ref 70–99)
Potassium: 4.7 mmol/L (ref 3.5–5.1)
Sodium: 135 mmol/L (ref 135–145)
Total Bilirubin: 0.3 mg/dL (ref 0.3–1.2)
Total Protein: 7.2 g/dL (ref 6.5–8.1)

## 2019-03-22 LAB — HEMOGLOBIN A1C
Hgb A1c MFr Bld: 13.7 % — ABNORMAL HIGH (ref 4.8–5.6)
Mean Plasma Glucose: 346.49 mg/dL

## 2019-03-22 LAB — D-DIMER, QUANTITATIVE: D-Dimer, Quant: 0.58 ug/mL-FEU — ABNORMAL HIGH (ref 0.00–0.50)

## 2019-03-22 LAB — TROPONIN I (HIGH SENSITIVITY): Troponin I (High Sensitivity): 4 ng/L (ref <18)

## 2019-03-22 LAB — LACTIC ACID, PLASMA
Lactic Acid, Venous: 2.1 mmol/L (ref 0.5–1.9)
Lactic Acid, Venous: 3.7 mmol/L (ref 0.5–1.9)
Lactic Acid, Venous: >11 mmol/L (ref 0.5–1.9)
Lactic Acid, Venous: 6.5 mmol/L (ref 0.5–1.9)
Lactic Acid, Venous: 3.2 mmol/L (ref 0.5–1.9)

## 2019-03-22 LAB — SARS CORONAVIRUS 2 BY RT PCR (HOSPITAL ORDER, PERFORMED IN ~~LOC~~ HOSPITAL LAB): SARS Coronavirus 2: NEGATIVE

## 2019-03-22 LAB — CK: Total CK: 85 U/L (ref 38–234)

## 2019-03-22 LAB — CBG MONITORING, ED: Glucose-Capillary: 296 mg/dL — ABNORMAL HIGH (ref 70–99)

## 2019-03-22 LAB — STREP PNEUMONIAE URINARY ANTIGEN: Strep Pneumo Urinary Antigen: NEGATIVE

## 2019-03-22 MED ORDER — AMLODIPINE BESYLATE 10 MG PO TABS
10.0000 mg | ORAL_TABLET | Freq: Every day | ORAL | Status: DC
  Administered 2019-03-22 – 2019-03-26 (×5): 10 mg via ORAL
  Filled 2019-03-22 (×5): qty 1

## 2019-03-22 MED ORDER — SODIUM CHLORIDE 0.9 % IV SOLN
INTRAVENOUS | Status: DC
  Administered 2019-03-22: 10:00:00 via INTRAVENOUS

## 2019-03-22 MED ORDER — INSULIN REGULAR(HUMAN) IN NACL 100-0.9 UT/100ML-% IV SOLN
INTRAVENOUS | Status: DC
  Administered 2019-03-22: 3.2 [IU]/h via INTRAVENOUS
  Administered 2019-03-23: 7.3 [IU]/h via INTRAVENOUS
  Filled 2019-03-22 (×2): qty 100

## 2019-03-22 MED ORDER — IPRATROPIUM-ALBUTEROL 0.5-2.5 (3) MG/3ML IN SOLN
3.0000 mL | Freq: Three times a day (TID) | RESPIRATORY_TRACT | Status: DC
  Administered 2019-03-23 – 2019-03-26 (×11): 3 mL via RESPIRATORY_TRACT
  Filled 2019-03-22 (×11): qty 3

## 2019-03-22 MED ORDER — CARVEDILOL 25 MG PO TABS
25.0000 mg | ORAL_TABLET | Freq: Two times a day (BID) | ORAL | Status: DC
  Administered 2019-03-22 – 2019-03-26 (×9): 25 mg via ORAL
  Filled 2019-03-22 (×9): qty 1

## 2019-03-22 MED ORDER — SODIUM CHLORIDE 0.9 % IV SOLN
INTRAVENOUS | Status: DC
  Administered 2019-03-22: 16:00:00 via INTRAVENOUS

## 2019-03-22 MED ORDER — MELATONIN 3 MG PO TABS
3.0000 mg | ORAL_TABLET | Freq: Every evening | ORAL | Status: DC | PRN
  Filled 2019-03-22: qty 1

## 2019-03-22 MED ORDER — TRAMADOL HCL 50 MG PO TABS
50.0000 mg | ORAL_TABLET | Freq: Three times a day (TID) | ORAL | Status: DC | PRN
  Administered 2019-03-22 – 2019-03-26 (×10): 50 mg via ORAL
  Filled 2019-03-22 (×12): qty 1

## 2019-03-22 MED ORDER — HYDROMORPHONE HCL 1 MG/ML IJ SOLN
1.0000 mg | Freq: Once | INTRAMUSCULAR | Status: AC
  Administered 2019-03-22: 05:00:00 1 mg via INTRAVENOUS
  Filled 2019-03-22: qty 1

## 2019-03-22 MED ORDER — GABAPENTIN 300 MG PO CAPS
600.0000 mg | ORAL_CAPSULE | Freq: Two times a day (BID) | ORAL | Status: DC
  Administered 2019-03-22 – 2019-03-26 (×9): 600 mg via ORAL
  Filled 2019-03-22 (×9): qty 2

## 2019-03-22 MED ORDER — DEXTROSE 50 % IV SOLN: 25.0000 mL | INTRAVENOUS | Status: DC | PRN

## 2019-03-22 MED ORDER — DEXTROSE-NACL 5-0.45 % IV SOLN
INTRAVENOUS | Status: DC
  Administered 2019-03-22 – 2019-03-23 (×2): via INTRAVENOUS

## 2019-03-22 MED ORDER — ALBUTEROL SULFATE HFA 108 (90 BASE) MCG/ACT IN AERS: 2.0000 | INHALATION_SPRAY | Freq: Four times a day (QID) | RESPIRATORY_TRACT | Status: DC | PRN

## 2019-03-22 MED ORDER — INSULIN GLARGINE 100 UNIT/ML ~~LOC~~ SOLN
65.0000 [IU] | Freq: Two times a day (BID) | SUBCUTANEOUS | Status: DC
  Administered 2019-03-22: 65 [IU] via SUBCUTANEOUS
  Filled 2019-03-22 (×3): qty 0.65

## 2019-03-22 MED ORDER — MONTELUKAST SODIUM 10 MG PO TABS
10.0000 mg | ORAL_TABLET | Freq: Every day | ORAL | Status: DC
  Administered 2019-03-22 – 2019-03-25 (×4): 10 mg via ORAL
  Filled 2019-03-22 (×4): qty 1

## 2019-03-22 MED ORDER — IPRATROPIUM BROMIDE 0.02 % IN SOLN
0.5000 mg | Freq: Once | RESPIRATORY_TRACT | Status: AC
  Administered 2019-03-22: 05:00:00 0.5 mg via RESPIRATORY_TRACT
  Filled 2019-03-22: qty 2.5

## 2019-03-22 MED ORDER — ALBUTEROL SULFATE (2.5 MG/3ML) 0.083% IN NEBU: 2.5000 mg | INHALATION_SOLUTION | RESPIRATORY_TRACT | Status: DC | PRN

## 2019-03-22 MED ORDER — ALBUTEROL SULFATE (2.5 MG/3ML) 0.083% IN NEBU
5.0000 mg | INHALATION_SOLUTION | Freq: Once | RESPIRATORY_TRACT | Status: AC
  Administered 2019-03-22: 05:00:00 5 mg via RESPIRATORY_TRACT
  Filled 2019-03-22: qty 6

## 2019-03-22 MED ORDER — METHYLPREDNISOLONE SODIUM SUCC 125 MG IJ SOLR
125.0000 mg | Freq: Once | INTRAMUSCULAR | Status: AC
  Administered 2019-03-22: 02:00:00 125 mg via INTRAVENOUS
  Filled 2019-03-22: qty 2

## 2019-03-22 MED ORDER — IOHEXOL 350 MG/ML SOLN
75.0000 mL | Freq: Once | INTRAVENOUS | Status: AC | PRN
  Administered 2019-03-22: 75 mL via INTRAVENOUS

## 2019-03-22 MED ORDER — MOMETASONE FURO-FORMOTEROL FUM 200-5 MCG/ACT IN AERO
2.0000 | INHALATION_SPRAY | Freq: Two times a day (BID) | RESPIRATORY_TRACT | Status: DC
  Administered 2019-03-22 – 2019-03-26 (×8): 2 via RESPIRATORY_TRACT
  Filled 2019-03-22: qty 8.8

## 2019-03-22 MED ORDER — ONDANSETRON HCL 4 MG/2ML IJ SOLN
4.0000 mg | Freq: Once | INTRAMUSCULAR | Status: AC
  Administered 2019-03-22: 02:00:00 4 mg via INTRAVENOUS
  Filled 2019-03-22: qty 2

## 2019-03-22 MED ORDER — IPRATROPIUM-ALBUTEROL 0.5-2.5 (3) MG/3ML IN SOLN
3.0000 mL | Freq: Four times a day (QID) | RESPIRATORY_TRACT | Status: DC
  Administered 2019-03-22 (×2): 3 mL via RESPIRATORY_TRACT
  Filled 2019-03-22 (×2): qty 3

## 2019-03-22 MED ORDER — SODIUM CHLORIDE 0.9 % IV BOLUS (SEPSIS)
1000.0000 mL | Freq: Once | INTRAVENOUS | Status: AC
  Administered 2019-03-22: 05:00:00 1000 mL via INTRAVENOUS

## 2019-03-22 MED ORDER — ALBUTEROL (5 MG/ML) CONTINUOUS INHALATION SOLN
15.0000 mg/h | INHALATION_SOLUTION | Freq: Once | RESPIRATORY_TRACT | Status: AC
  Administered 2019-03-22: 02:00:00 15 mg/h via RESPIRATORY_TRACT
  Filled 2019-03-22: qty 20

## 2019-03-22 MED ORDER — METHYLPREDNISOLONE SODIUM SUCC 125 MG IJ SOLR
60.0000 mg | Freq: Two times a day (BID) | INTRAMUSCULAR | Status: AC
  Administered 2019-03-22 (×2): 60 mg via INTRAVENOUS
  Filled 2019-03-22 (×2): qty 2

## 2019-03-22 MED ORDER — BUPROPION HCL ER (SR) 150 MG PO TB12
150.0000 mg | ORAL_TABLET | Freq: Two times a day (BID) | ORAL | Status: DC
  Administered 2019-03-22 – 2019-03-26 (×9): 150 mg via ORAL
  Filled 2019-03-22 (×9): qty 1

## 2019-03-22 MED ORDER — HYDROXYZINE HCL 25 MG PO TABS
25.0000 mg | ORAL_TABLET | Freq: Three times a day (TID) | ORAL | Status: DC | PRN
  Administered 2019-03-22: 25 mg via ORAL
  Filled 2019-03-22: qty 1

## 2019-03-22 MED ORDER — ATORVASTATIN CALCIUM 10 MG PO TABS
20.0000 mg | ORAL_TABLET | Freq: Every day | ORAL | Status: DC
  Administered 2019-03-22 – 2019-03-26 (×5): 20 mg via ORAL
  Filled 2019-03-22 (×5): qty 2

## 2019-03-22 MED ORDER — IPRATROPIUM BROMIDE 0.02 % IN SOLN
1.0000 mg | Freq: Once | RESPIRATORY_TRACT | Status: AC
  Administered 2019-03-22: 02:00:00 1 mg via RESPIRATORY_TRACT
  Filled 2019-03-22: qty 5

## 2019-03-22 MED ORDER — ALBUTEROL SULFATE (2.5 MG/3ML) 0.083% IN NEBU: 2.5000 mg | INHALATION_SOLUTION | Freq: Four times a day (QID) | RESPIRATORY_TRACT | Status: DC | PRN

## 2019-03-22 MED ORDER — SERTRALINE HCL 100 MG PO TABS
100.0000 mg | ORAL_TABLET | Freq: Every day | ORAL | Status: DC
  Administered 2019-03-22 – 2019-03-25 (×4): 100 mg via ORAL
  Filled 2019-03-22 (×4): qty 1

## 2019-03-22 MED ORDER — INSULIN ASPART 100 UNIT/ML ~~LOC~~ SOLN
0.0000 [IU] | Freq: Three times a day (TID) | SUBCUTANEOUS | Status: DC
  Administered 2019-03-22: 20 [IU] via SUBCUTANEOUS

## 2019-03-22 MED ORDER — MORPHINE SULFATE (PF) 4 MG/ML IV SOLN
4.0000 mg | Freq: Once | INTRAVENOUS | Status: AC
  Administered 2019-03-22: 4 mg via INTRAVENOUS
  Filled 2019-03-22: qty 1

## 2019-03-22 MED ORDER — SODIUM CHLORIDE 0.9 % IV SOLN
2.0000 g | INTRAVENOUS | Status: DC
  Administered 2019-03-22 – 2019-03-25 (×4): 2 g via INTRAVENOUS
  Filled 2019-03-22: qty 2
  Filled 2019-03-22: qty 20
  Filled 2019-03-22 (×3): qty 2

## 2019-03-22 MED ORDER — TIZANIDINE HCL 4 MG PO TABS
4.0000 mg | ORAL_TABLET | Freq: Three times a day (TID) | ORAL | Status: DC | PRN
  Filled 2019-03-22: qty 1

## 2019-03-22 MED ORDER — SODIUM CHLORIDE 0.9 % IV BOLUS
1000.0000 mL | Freq: Once | INTRAVENOUS | Status: AC
  Administered 2019-03-22: 1000 mL via INTRAVENOUS

## 2019-03-22 MED ORDER — INSULIN ASPART 100 UNIT/ML ~~LOC~~ SOLN: 0.0000 [IU] | Freq: Every day | SUBCUTANEOUS | Status: DC

## 2019-03-22 MED ORDER — SODIUM CHLORIDE 0.9 % IV SOLN
500.0000 mg | INTRAVENOUS | Status: DC
  Administered 2019-03-22 – 2019-03-23 (×2): 500 mg via INTRAVENOUS
  Filled 2019-03-22 (×3): qty 500

## 2019-03-22 MED ORDER — ONDANSETRON HCL 4 MG/2ML IJ SOLN
4.0000 mg | Freq: Four times a day (QID) | INTRAMUSCULAR | Status: DC | PRN
  Administered 2019-03-22: 4 mg via INTRAVENOUS
  Filled 2019-03-22: qty 2

## 2019-03-22 MED ORDER — MORPHINE SULFATE (PF) 4 MG/ML IV SOLN
4.0000 mg | Freq: Once | INTRAVENOUS | Status: AC
  Administered 2019-03-22: 02:00:00 4 mg via INTRAVENOUS
  Filled 2019-03-22: qty 1

## 2019-03-22 MED ORDER — IPRATROPIUM BROMIDE 0.02 % IN SOLN
RESPIRATORY_TRACT | Status: AC
  Administered 2019-03-22: 02:00:00
  Filled 2019-03-22: qty 2.5

## 2019-03-22 MED ORDER — ALBUTEROL (5 MG/ML) CONTINUOUS INHALATION SOLN: 10.0000 mg/h | INHALATION_SOLUTION | Freq: Once | RESPIRATORY_TRACT | Status: DC

## 2019-03-22 MED ORDER — INSULIN DEGLUDEC 200 UNIT/ML ~~LOC~~ SOPN: 130.0000 [IU] | PEN_INJECTOR | Freq: Every day | SUBCUTANEOUS | Status: DC

## 2019-03-22 MED ORDER — SODIUM CHLORIDE 0.9 % IV BOLUS (SEPSIS)
1000.0000 mL | Freq: Once | INTRAVENOUS | Status: AC
  Administered 2019-03-22: 02:00:00 1000 mL via INTRAVENOUS

## 2019-03-22 MED ORDER — PREDNISONE 20 MG PO TABS
40.0000 mg | ORAL_TABLET | Freq: Every day | ORAL | Status: AC
  Administered 2019-03-23 – 2019-03-26 (×4): 40 mg via ORAL
  Filled 2019-03-22 (×4): qty 2

## 2019-03-22 MED ORDER — ENOXAPARIN SODIUM 40 MG/0.4ML ~~LOC~~ SOLN
40.0000 mg | SUBCUTANEOUS | Status: DC
  Administered 2019-03-22 – 2019-03-26 (×5): 40 mg via SUBCUTANEOUS
  Filled 2019-03-22 (×5): qty 0.4

## 2019-03-22 NOTE — Progress Notes (Signed)
CRITICAL VALUE ALERT  Critical Value:  LA >11  Date & Time Notied: 03/22/19 1125  Provider Notified: 03/22/19 1128  Orders Received/Actions taken: labs/consult pccm

## 2019-03-22 NOTE — Progress Notes (Addendum)
Inpatient Diabetes Program Recommendations  AACE/ADA: New Consensus Statement on Inpatient Glycemic Control (2015)  Target Ranges:  Prepandial:   less than 140 mg/dL      Peak postprandial:   less than 180 mg/dL (1-2 hours)      Critically ill patients:  140 - 180 mg/dL   Lab Results  Component Value Date   GLUCAP 437 (H) 03/22/2019   HGBA1C 12.3 (A) 11/22/2018    Review of Glycemic Control Results for [name], [name] Bradley" (MRN [id number]) as of 03/22/2019 13:30  Ref. Range 03/22/2019 00:46 03/22/2019 11:45  Glucose-Capillary Latest Ref Range: 70 - 99 mg/dL 296 (H) 437 (H)   Diabetes history: Type 2 DM Outpatient Diabetes medications: Tresiba 130 units QD Current orders for Inpatient glycemic control: Lantus 65 units BID, Novolog 0-20 units TID, Novolog 0-5 units QHS Solumedrol 60 mg BID  Inpatient Diabetes Program Recommendations:    Consider repeating A1C, as last was from 11/2018.  Discussed patient with Dr Lorin Mercy via secure chat. A1C ordered. Will follow back up on plan given glucose trends in the setting of steroids pending BMP. Addendum@1540 : Noted results of BMP- CO15, Anion gap 18. Plan for glucostabilizer.  Will continue to follow.   Thanks, Bronson Curb, MSN, RNC-OB Diabetes Coordinator [phone] (8a-5p)

## 2019-03-22 NOTE — Progress Notes (Signed)
Nutrition Brief Note  RD working remotely.  RD consulted for assessment of nutrition requirements/ status per COPD GOLD protocol.   Wt Readings from Last 15 Encounters:  03/22/19 115.3 kg  11/22/18 112.9 kg  10/29/18 121.9 kg  10/20/18 111.1 kg  10/05/18 111.1 kg  09/27/18 111.6 kg  09/02/18 112 kg  09/01/18 112.7 kg  07/30/18 111.6 kg  07/26/18 111.6 kg  06/18/18 108.9 kg  06/17/18 108.1 kg  06/15/18 108 kg  04/12/18 112 kg  03/11/18 115.3 kg   [name] is a 48 y.o. female with medical history significant of trach dependence due to tracheal stenosis; asthma; HTN; severe OSA; morbid obesity (BMI 48); DM; and chronic pain presenting with SOB/CP.  She is unable to provide significant history due to lack of pauci-muir valve and trach dependence.  She is complaining of right-sided chest pain and SOB.  She was placed on trach collar O2 but has not been hypoxic <93% per chart documentation.  No apparent fever.  She had a BM while in the ER just prior to my evaluation.  Pt admitted for chest pain.   Per H&P, pt is trach dependent with passy muir valve, so difficult to obtain history. RD unable to obtain further nutriton-related history at this time.   Reviewed I/O's: + 2 L x 24 hours  UOP: 750 ml x 24 hours  Wt has been stable over the past 6 years.   Lab Results  Component Value Date   HGBA1C 13.7 (H) 03/22/2019   PTA DM medications are 130 units insulin degludec daily. If note, pt is also on steroids PTA, which is likely contributing to elevated Hgb A1c.    Labs reviewed: CBGS: 385-437 (inpatient orders for glycemic control are IV insulin drip).   Body mass index is 48.03 kg/m. Patient meets criteria for extreme obesity, class III based on current BMI.   Current diet order is NPO, patient is consuming approximately n/a% of meals at this time. Labs and medications reviewed.   No nutrition interventions warranted at this time. If nutrition issues arise,  please consult RD.   [name] A. Jimmye Norman, RD, LDN, [place] Registered Dietitian II Certified Diabetes Care and Education Specialist Pager: [phone] After hours Pager: [phone]

## 2019-03-22 NOTE — Progress Notes (Addendum)
Not unexpectedly, patient is now in DKA.  Will start glucostabilizer.  Lactate improved, continue to monitor.  DKA -Patient with poor baseline control, A1c 13.7 -Would recommend continuing insulin drip at least until morning regardless of rapidity of closure of gap and normalization of labs -BMP q6h -IVF at 150 cc/hr, NS until glucose <250 and then decrease rate to 125 and change to D51/2NS -Discontinue glargine and SSI for now     I called and updated her husband.   Carlyon Shadow, M.D.

## 2019-03-22 NOTE — Progress Notes (Signed)
PT Cancellation Note  Patient Details Name: [name] MRN: [id number] DOB: 24-Apr-1971   Cancelled Treatment:    Reason Eval/Treat Not Completed: Medical issues which prohibited therapy.  Checked in with RN at 10:20 and pt had just gotten to the [facility] and back to bed and needed some time to recover.  Then, PT came back and checked in with RW at 11:40 and she reports pt's lactic acid levels are significantly elevated and critical care is being consulted.  She preferred PT hold for today.  PT to check back tomorrow.  Thanks,  Barbarann Ehlers. Jocelin Schuelke, PT, DPT  Acute Rehabilitation [phone] pager [phone] office  @ [facility]: [phone]     Harvie Heck 03/22/2019, 12:48 PM

## 2019-03-22 NOTE — H&P (Signed)
History and Physical    [name] WGY:[id number] DOB: 31-Aug-1971 DOA: 03/21/2019  PCP: [name], MD Consultants:  Marlou Sa - orthopedics; Devries - ENT; Loanne Drilling - endocrinology Patient coming from:  Home ; NOK: Husband, [name], [phone]  Chief Complaint: SOB/CP  HPI: [name] is a 48 y.o. female with medical history significant of trach dependence due to tracheal stenosis; asthma; HTN; severe OSA; morbid obesity (BMI 48); DM; and chronic pain presenting with SOB/CP.  She is unable to provide significant history due to lack of pauci-muir valve and trach dependence.  She is complaining of right-sided chest pain and SOB.  She was placed on trach collar O2 but has not been hypoxic <93% per chart documentation.  No apparent fever.  She had a BM while in the ER just prior to my evaluation.  The patient was admitted to [facility] from 2/1-7 with acute hypoxic respiratory failure in the setting of CAP and asthma exacerbation.    ED Course: Carryover per Dr. Marlowe Sax:  Patient with history of asthma, previous intubation and trach dependence. Yellow sputum coming from the trach. Wheezing on exam. Not hypoxic. Placed on supplemental oxygen via trach for comfort. Temperature 100.4 oral but rectal normal. Lactate 2.1, repeat 3.7 thought to be secondary to her respiratory status. COVID negative. No source of infection to explain elevated lactate. CT angiogram negative. White count normal. High-sensitivity troponin negative. Received continuous nebs and IV Solu-Medrol in the ED. 2 L IV fluid boluses ordered. Admission requested for asthma exacerbation observation.  Review of Systems: Unable to perform   Past Medical History:  Diagnosis Date   Arthritis    Asthma    Chronic back pain    Chronic chest pain    Depression    DM (diabetes mellitus) ([place])    INSULIN DEPENDENT   GERD (gastroesophageal reflux disease)    Headache(784.0)      Hypertension    Hypokalemia    Respiratory disease 05/2017   Tracheostomy in place [facility]) 02/2017    Past Surgical History:  Procedure Laterality Date   APPENDECTOMY     CESAREAN SECTION     x 3   CHOLECYSTECTOMY N/A 03/02/2014   Procedure: LAPAROSCOPIC CHOLECYSTECTOMY;  Surgeon: Joyice Faster. Cornett, MD;  Location: [place];  Service: General;  Laterality: N/A;   HERNIA REPAIR     PANENDOSCOPY N/A 03/04/2017   Procedure: PANENDOSCOPY WITH POSSIBLE FOREIGN BODY REMOVAL;  Surgeon: Jodi Marble, MD;  Location: [place];  Service: ENT;  Laterality: N/A;   ROTATOR CUFF REPAIR     TRACHEOSTOMY  02/2017   VESICOVAGINAL FISTULA CLOSURE W/ TAH  2009    Social History   Socioeconomic History   Marital status: Married    Spouse name: Not on file   Number of children: Not on file   Years of education: Not on file   Highest education level: Not on file  Occupational History   Not on file  Social Needs   Financial resource strain: Not on file   Food insecurity    Worry: Not on file    Inability: Not on file   Transportation needs    Medical: Not on file    Non-medical: Not on file  Tobacco Use   Smoking status: Former Smoker    Packs/day: 0.25    Years: 22.00    Pack years: 5.50    Types: Cigarettes    Quit date: 01/20/2017    Years since quitting: 2.1   Smokeless tobacco:  Never Used  Substance and Sexual Activity   Alcohol use: No    Alcohol/week: 1.0 - 2.0 standard drinks    Types: 1 - 2 Glasses of wine per week    Comment: socially   Drug use: No   Sexual activity: Yes    Partners: Male    Birth control/protection: None  Lifestyle   Physical activity    Days per week: Not on file    Minutes per session: Not on file   Stress: Not on file  Relationships   Social connections    Talks on phone: Not on file    Gets together: Not on file    Attends religious service: Not on file    Active member of club or organization: Not on file    Attends  meetings of clubs or organizations: Not on file    Relationship status: Not on file   Intimate partner violence    Fear of current or ex partner: Not on file    Emotionally abused: Not on file    Physically abused: Not on file    Forced sexual activity: Not on file  Other Topics Concern   Not on file  Social History Narrative   Not on file    Allergies  Allergen Reactions   Reglan [Metoclopramide] Other (See Comments)    Panic attack    Family History  Problem Relation Age of Onset   Heart attack Mother    Stroke Mother    Diabetes Mother    Hypertension Mother    Arthritis Mother    Stroke Father    Hypertension Sister    Diabetes Sister    Seizures Brother    Diabetes Brother     Prior to Admission medications   Medication Sig Start Date End Date Taking? Authorizing Provider  albuterol (PROAIR HFA) 108 (90 Base) MCG/ACT inhaler Inhale 2 puffs into the lungs every 6 (six) hours as needed for wheezing or shortness of breath. 11/22/18  Yes [name], MD  albuterol (PROVENTIL) (2.5 MG/3ML) 0.083% nebulizer solution Take 3 mLs (2.5 mg total) by nebulization every 6 (six) hours as needed for wheezing or shortness of breath. 04/12/18  Yes Ena Dawley, Tiffany S, PA-C  amLODipine (NORVASC) 10 MG tablet Take 1 tablet (10 mg total) by mouth daily. 11/22/18  Yes [name], MD  aspirin-acetaminophen-caffeine (EXCEDRIN MIGRAINE) [phone] MG tablet Take 2 tablets by mouth every 6 (six) hours as needed for headache.   Yes [provider]  atorvastatin (LIPITOR) 20 MG tablet Take 1 tablet (20 mg total) by mouth daily. 11/22/18  Yes [name], MD  budesonide-formoterol (SYMBICORT) 160-4.5 MCG/ACT inhaler Inhale 2 puffs into the lungs 2 (two) times daily. 11/22/18  Yes [name], MD  buPROPion (WELLBUTRIN SR) 150 MG 12 hr tablet Take 1 tablet (150 mg total) by mouth 2 (two) times daily. 11/22/18  Yes [name], MD  carvedilol (COREG) 25 MG tablet Take 1  tablet (25 mg total) by mouth 2 (two) times daily with a meal. 11/22/18  Yes Newlin, Enobong, MD  furosemide (LASIX) 20 MG tablet Take 1 tablet (20 mg total) by mouth daily. 11/22/18  Yes [name], MD  gabapentin (NEURONTIN) 300 MG capsule Take 2 capsules (600 mg total) by mouth 2 (two) times daily. 10/29/18  Yes Domenic Polite, MD  hydrOXYzine (ATARAX/VISTARIL) 25 MG tablet Take 1 tablet (25 mg total) by mouth 3 (three) times daily as needed for anxiety. 11/22/18  Yes [name], MD  ibuprofen (ADVIL,MOTRIN) 600 MG tablet Take 1 tablet (600 mg total) by mouth every 8 (eight) hours as needed. Patient taking differently: Take 600 mg by mouth every 8 (eight) hours as needed for mild pain.  11/23/18  Yes Newlin, Charlane Ferretti, MD  Insulin Degludec (TRESIBA FLEXTOUCH) 200 UNIT/ML SOPN Inject 130 Units into the skin daily. Take an extra 10 units while on Prednisone for the next 4days Patient taking differently: Inject 130 Units into the skin daily.  10/29/18  Yes Domenic Polite, MD  lisinopril (PRINIVIL,ZESTRIL) 5 MG tablet Take 1 tablet (5 mg total) by mouth daily. 11/22/18  Yes [name], MD  Melatonin 3 MG TABS Take 3 mg by mouth at bedtime as needed (for sleep).  10/31/17  Yes [provider]  montelukast (SINGULAIR) 10 MG tablet Take 1 tablet (10 mg total) by mouth at bedtime. 11/22/18  Yes [name], MD  naproxen sodium (ALEVE) 220 MG tablet Take 440 mg by mouth 2 (two) times daily as needed (pain).   Yes [provider]  sertraline (ZOLOFT) 100 MG tablet Take 1 tablet (100 mg total) by mouth at bedtime. 11/22/18  Yes [name], MD  SUMAtriptan (IMITREX) 100 MG tablet At the onset of a migraine; may repeat in 2 hours. Max daily dose 252m Patient taking differently: Take 100 mg by mouth every 2 (two) hours as needed for migraine.  11/22/18  Yes NCharlott Rakes MD  tiZANidine (ZANAFLEX) 4 MG tablet Take 1 tablet (4 mg total) by mouth every 8 (eight) hours as needed for muscle  spasms. 11/22/18  Yes NCharlott Rakes MD  traMADol (ULTRAM) 50 MG tablet Take 1 tablet (50 mg total) by mouth 3 (three) times daily as needed. Patient taking differently: Take 50 mg by mouth 3 (three) times daily as needed for moderate pain.  01/07/19  Yes DMeredith Pel MD  ACCU-CHEK SOFTCLIX LANCETS lancets Use as instructed to check blood sugar once daily. E11.9 06/18/17   NCharlott Rakes MD  amoxicillin (AMOXIL) 500 MG capsule Take 1 capsule (500 mg total) by mouth 3 (three) times daily. Patient not taking: Reported on 03/22/2019 11/23/18   NCharlott Rakes MD  benzonatate (TESSALON) 200 MG capsule Take 1 capsule (200 mg total) by mouth 3 (three) times daily as needed for cough. Patient not taking: Reported on 03/22/2019 11/22/18   NCharlott Rakes MD  Blood Glucose Monitoring Suppl (ACCU-CHEK AVIVA PLUS) w/Device KIT Used to check blood sugars once daily. E11.9 06/11/17   NCharlott Rakes MD  glucose blood (ACCU-CHEK AVIVA) test strip Use as directed to check blood sugars once daily. E11.9 10/08/18   ERenato Shin MD  Insulin Pen Needle (FIFTY50 PEN NEEDLES) 31G X 8 MM MISC Inject 1 Units as directed daily. 07/30/18   [provider]  sodium chloride HYPERTONIC 3 % nebulizer solution Take 4 mLs by nebulization 2 (two) times daily. Patient not taking: Reported on 10/25/2018 05/08/17   AAline August MD  fluticasone-salmeterol (ADVAIR HFA) 230-21 MCG/ACT inhaler Inhale 2 puffs into the lungs 2 (two) times daily. 10/15/17 10/15/17  NCharlott Rakes MD    Physical Exam: Vitals:   03/22/19 0815 03/22/19 0856 03/22/19 0900 03/22/19 1100  BP: (!) 142/83  (!) 142/81   Pulse: (!) 128 (!) 122 (!) 124 (!) 114  Resp: _0 Temp:   98.6 F (37 C)   TempSrc:   Oral   SpO2: 97% 97% 98% 99%  Weight:   115.3 kg  General:  Appears SOB and in pain but not toxic  Eyes:  PERRL, EOMI, normal lids, iris  ENT:  grossly normal hearing, lips & tongue, mmm; appropriate dentition; trach in  place with trach collar  Neck:  no LAD, masses or thyromegaly  Cardiovascular:  RR with persistent tachycardia, no m/r/g. No LE edema.   Respiratory:   Mild diffuse rales without clear wheezing, generally good air movement.  Normal to mildly increased respiratory effort.  Abdomen:  soft, mildly diffusely tender, ND, NABS, obese  Back:   normal alignment, no CVAT  Skin:  no rash or induration seen on limited exam  Musculoskeletal:  grossly normal tone BUE/BLE, good ROM, no bony abnormality  Psychiatric:  Uncomfortable, but mostly unable to communicate solely due to lack of Pauci-Muir valve rather than to distress  Neurologic:  CN 2-12 grossly intact, moves all extremities in coordinated fashion    Radiological Exams on Admission: Dg Chest 2 View  Result Date: 03/21/2019 CLINICAL DATA:  Chest pain EXAM: CHEST - 2 VIEW COMPARISON:  None. FINDINGS: Cardiomegaly. Tracheostomy. Both lungs are clear. The visualized skeletal structures are unremarkable. IMPRESSION: Cardiomegaly without acute abnormality of the lungs.  Tracheostomy. Electronically Signed   By: Eddie Candle M.D.   On: 03/21/2019 21:41   Ct Angio Chest Pe W And/or Wo Contrast  Result Date: 03/22/2019 CLINICAL DATA:  Shortness of breath and chest pain with positive D-dimer EXAM: CT ANGIOGRAPHY CHEST WITH CONTRAST TECHNIQUE: Multidetector CT imaging of the chest was performed using the standard protocol during bolus administration of intravenous contrast. Multiplanar CT image reconstructions and MIPs were obtained to evaluate the vascular anatomy. CONTRAST:  45m OMNIPAQUE IOHEXOL 350 MG/ML SOLN COMPARISON:  10/23/2018 FINDINGS: Cardiovascular: Cardiomegaly. No pericardial effusion. Suboptimal pulmonary artery visualization due to bolus diffusion and soft tissue attenuation-likely best obtainable. There is no evidence of pulmonary embolism. Negative aorta. Mediastinum/Nodes: Chronically enlarged right upper paratracheal lymph node  measuring 2 cm short axis. Lungs/Pleura: Tracheostomy tube in place. Central airways are clear. There is minor dependent atelectasis. There is no edema, consolidation, effusion, or pneumothorax. Upper Abdomen: Prominent hepatic steatosis. Musculoskeletal: No acute or aggressive finding Review of the MIP images confirms the above findings. IMPRESSION: 1. No evidence of pulmonary embolism or other acute finding. 2. Cardiomegaly and hepatic steatosis. Electronically Signed   By: JMonte FantasiaM.D.   On: 03/22/2019 04:53    EKG: Independently reviewed.   0007 - NSR with rate 92; no evidence of acute ischemia; NSCSLT 0448 - Sinus tachycardia with rate 130; nonspecific ST changes with no evidence of acute ischemia   Labs on Admission: I have personally reviewed the available labs and imaging studies at the time of the admission.  Pertinent labs:   ABG: 7.364/35.8/161.0 Lactate 2.1, 3.7 HS troponin 4 x 2 CO2 20 Glucose 389, 296 BUN 8/Creatinine 1.46/GFR 48; 12/1.09/69 on 3/2 WBC 8.8 Hgb 11.9 HCG negative COVID negative UA: >500 glucose, 5 ketones  Assessment/Plan Principal Problem:   Chest pain of uncertain etiology Active Problems:   Sleep apnea, obstructive   Essential hypertension   Type 2 diabetes mellitus with diabetic polyneuropathy, without long-term current use of insulin (HCC)   Morbid obesity (HCC)   Chronic pain syndrome   [place]) [place]   Tracheostomy dependent (HCC)   Acute asthma exacerbation   Lactic acidosis   Chest pain -Patient presenting with c/o right-sided chest pain -CXR fairly unremarkable -Chest CTA also not diagnostic -EKG without ischemia, although the second EKG  showed sinus tachycardia up to 130s -HS troponin negative x 2 -Does not appear to be ischemic chest pain, but clearly patient is having significant pain and lactate is markedly elevating (see below) -LFTs were not drawn in the ER and these have been added on -May need CT  abdomen/pelvis -PCCM called and asked to evaluate patient urgently -Currently in obs status on tele as an overnight carryover, but may need higher level of care  Lactic acidosis -Mild elevated to 2s on initial draw -Repeat lactate was almost 4 at 0330 -Current lactate is >56 -Uncertain etiology, as noted above -She appears to be oxygenating well and CTA chest unremarkable -LFTs pending -Likely needs stat CT abdomen/pelvic to assess for ischemic bowel if LFTs are elevated -PCCM is consulting -1L NS bolus given for now -Otherwise currently hemodynamically stable -Repeat lactate at 1330 and q3h x 2  Tracheostomy dependence -Patient with h/o tracheal stenosis requiring trach -Not apparently on O2 at home -Not hypoxic on arrival on room air -Placed on [place] O2 for comfort while in the ER  AKI -Likely prerenal in the setting of acute illness -Bolus x 1L now for lactic acidosis and continue IVF -Recheck BMP at 1330  Acute asthma exacerbation -The patient was not obviously wheezing or with hypoxia at the time of my evaluation -Will continue treatment for asthma, but lower suspicion at this time for this as her primary issue  DM -Glucose was improving after admission -Initial CO2 20 with 5 ketones -Given IVF -Usually on Tresiba 120 BID but changed to Lantus 60 mg BID; first dose now with 20 units SSI -Will check BMP at 1330 and consider if Glucostabilizer is needed  HTN -Continue home meds - Norvasc, Coreg -Hold Lisinopril due to AKI  Chronic pain -Continue Ultram -Hold Excedrin Migraine, Motrin, Aleve  OSA  -Overnight trach collar O2  Morbid obesity -Needs ongoing efforts at weight loss, as OHS is likely a contributing factor to ongoing medical problems.   Note: This patient has been tested and is negative for the novel coronavirus COVID-19.  DVT prophylaxis:  Lovenox  Code Status:  Full  Family Communication: None present  Disposition Plan:  Home once clinically  improved Consults called: PCCM; CM; PT; OT  Admission status: It is my clinical opinion that referral for OBSERVATION is reasonable and necessary in this patient based on the above information provided. The aforementioned taken together are felt to place the patient at high risk for further clinical deterioration. However it is anticipated that the patient may be medically stable for discharge from the hospital within 24 to 48 hours.   Total critical care time: 70 minutes Critical care time was exclusive of separately billable procedures and treating other patients. Critical care was necessary to treat or prevent imminent or life-threatening deterioration. Critical care was time spent personally by me on the following activities: development of treatment plan with patient and/or surrogate as well as nursing, discussions with consultants, evaluation of patient's response to treatment, examination of patient, obtaining history from patient or surrogate, ordering and performing treatments and interventions, ordering and review of laboratory studies, ordering and review of radiographic studies, pulse oximetry and re-evaluation of patient's condition.     Karmen Bongo MD [facility]   How to contact the [facility] Attending or Consulting provider [place] or covering provider during after hours [place], for this patient?  1. Check the care team in [facility] and look for a) attending/consulting [facility] provider listed and b) the [place]  team listed 2. Log into [email or web address] and use [place]'s universal password to access. If you do not have the password, please contact the hospital operator. 3. Locate the [facility] provider you are looking for under [facility] and page to a number that you can be directly reached. 4. If you still have difficulty reaching the provider, please page the [facility] (Director on Call) for the Hospitalists listed on amion for assistance.   03/22/2019, 12:05 PM

## 2019-03-22 NOTE — ED Notes (Signed)
ED TO INPATIENT HANDOFF REPORT  ED Nurse Name and Phone #:  [phone]  S Name/Age/Gender [name] 48 y.o. female Room/Bed: 032C/032C  Code Status   Code Status: Prior  Home/SNF/Other Home Patient oriented to: self, place, time and situation Is this baseline? Yes   Triage Complete: Triage complete  Chief Complaint SOB, CP  Triage Note Pt from home c.o SOB/CP, pt has trach in place. Oxygen 98% on room air at this time. Pt sounds junky   Allergies Allergies  Allergen Reactions  . Reglan [Metoclopramide] Other (See Comments)    Panic attack    Level of Care/Admitting Diagnosis ED Disposition    ED Disposition Condition Comment   Admit  Hospital Area: [facility] [[id number]]  Level of Care: Telemetry Medical [104]  I expect the patient will be discharged within 24 hours: Yes  LOW acuity---Tx typically complete <24 hrs---ACUTE conditions typically can be evaluated <24 hours---LABS likely to return to acceptable levels <24 hours---IS near functional baseline---EXPECTED to return to current living arrangement---NOT newly hypoxic: Meets criteria for 5C-Observation unit  Covid Evaluation: Confirmed COVID Negative  Diagnosis: Acute asthma exacerbation [[id number]]  Admitting Physician: Shela Leff [[id number]]  Attending Physician: Shela Leff [[id number]]  PT Class (Do Not Modify): Observation [104]  PT Acc Code (Do Not Modify): Observation [10022]       B Medical/Surgery History Past Medical History:  Diagnosis Date  . Arthritis   . Asthma   . Chronic back pain   . Chronic chest pain   . Depression   . DM (diabetes mellitus) ([place])    INSULIN DEPENDENT  . GERD (gastroesophageal reflux disease)   . Headache(784.0)   . Hypertension   . Hypokalemia   . Respiratory disease 05/2017  . Tracheostomy in place [facility]) 02/2017   Past Surgical History:  Procedure Laterality Date  . APPENDECTOMY    . CESAREAN SECTION     x 3  .  CHOLECYSTECTOMY N/A 03/02/2014   Procedure: LAPAROSCOPIC CHOLECYSTECTOMY;  Surgeon: Joyice Faster. Cornett, MD;  Location: [place];  Service: General;  Laterality: N/A;  . HERNIA REPAIR    . PANENDOSCOPY N/A 03/04/2017   Procedure: PANENDOSCOPY WITH POSSIBLE FOREIGN BODY REMOVAL;  Surgeon: Jodi Marble, MD;  Location: [place];  Service: ENT;  Laterality: N/A;  . ROTATOR CUFF REPAIR    . TRACHEOSTOMY  02/2017  . VESICOVAGINAL FISTULA CLOSURE W/ TAH  2009     A IV Location/Drains/Wounds Patient Lines/Drains/Airways Status   Active Line/Drains/Airways    Name:   Placement date:   Placement time:   Site:   Days:   Peripheral IV 03/22/19 Left Antecubital   03/22/19    0145    Antecubital   less than 1   Airway 7 mm   03/04/17    1302     748   Tracheostomy [place] 4 mm Uncuffed   10/23/18    1638    4 mm   150   Tracheostomy [place] 6 mm Uncuffed   03/22/19    0200    6 mm   less than 1          Intake/Output Last 24 hours  Intake/Output Summary (Last 24 hours) at 03/22/2019 0815 Last data filed at 03/22/2019 0524 Gross per 24 hour  Intake 2000 ml  Output -  Net 2000 ml    Labs/Imaging Results for orders placed or performed during the hospital encounter of 03/21/19 (from the past 48 hour(s))  Basic  metabolic panel     Status: Abnormal   Collection Time: 03/21/19  8:35 PM  Result Value Ref Range   Sodium 134 (L) 135 - 145 mmol/L   Potassium 3.7 3.5 - 5.1 mmol/L   Chloride 102 98 - 111 mmol/L   CO2 20 (L) 22 - 32 mmol/L   Glucose, Bld 389 (H) 70 - 99 mg/dL   BUN 8 6 - 20 mg/dL   Creatinine, Ser 1.46 (H) 0.44 - 1.00 mg/dL   Calcium 9.0 8.9 - 10.3 mg/dL   GFR calc non Af Amer 42 (L) >60 mL/min   GFR calc Af Amer 48 (L) >60 mL/min   Anion gap 12 5 - 15    Comment: Performed at [facility] [address] [place], [place] [address]  CBC     Status: Abnormal   Collection Time: 03/21/19  8:35 PM  Result Value Ref Range   WBC 8.8 4.0 - 10.5 K/uL   RBC 4.27 3.87 - 5.11 MIL/uL    Hemoglobin 11.9 (L) 12.0 - 15.0 g/dL   HCT 37.2 36.0 - 46.0 %   MCV 87.1 80.0 - 100.0 fL   MCH 27.9 26.0 - 34.0 pg   MCHC 32.0 30.0 - 36.0 g/dL   RDW 14.1 11.5 - 15.5 %   Platelets 310 150 - 400 K/uL   nRBC 0.0 0.0 - 0.2 %    Comment: Performed at [facility], [place] [address], [place], [place] [address]  Troponin I (High Sensitivity)     Status: None   Collection Time: 03/21/19  8:35 PM  Result Value Ref Range   Troponin I (High Sensitivity) 4 <18 ng/L    Comment: (NOTE) Elevated high sensitivity troponin I (hsTnI) values and significant  changes across serial measurements may suggest ACS but many other  chronic and acute conditions are known to elevate hsTnI results.  Refer to the "Links" section for chest pain algorithms and additional  guidance. Performed at [facility], [place] [address], [place], [place] [address]   I-Stat beta hCG blood, ED     Status: None   Collection Time: 03/21/19  8:41 PM  Result Value Ref Range   I-stat hCG, quantitative <5.0 <5 mIU/mL   Comment 3            Comment:   GEST. AGE      CONC.  (mIU/mL)   <=1 WEEK        5 - 50     2 WEEKS       50 - 500     3 WEEKS       100 - 10,000     4 WEEKS     1,000 - 30,000        FEMALE AND NON-PREGNANT FEMALE:     LESS THAN 5 mIU/mL   Troponin I (High Sensitivity)     Status: None   Collection Time: 03/22/19 12:03 AM  Result Value Ref Range   Troponin I (High Sensitivity) 4 <18 ng/L    Comment: (NOTE) Elevated high sensitivity troponin I (hsTnI) values and significant  changes across serial measurements may suggest ACS but many other  chronic and acute conditions are known to elevate hsTnI results.  Refer to the "Links" section for chest pain algorithms and additional  guidance. Performed at [facility], [place] [address] [place], [place] [address]   CBG monitoring, ED     Status: Abnormal  Collection Time: 03/22/19 12:46 AM  Result Value Ref Range   Glucose-Capillary 296 (H) 70 - 99  mg/dL  SARS Coronavirus 2 (CEPHEID- Performed in [place] hospital lab), Hosp Order     Status: None   Collection Time: 03/22/19  1:29 AM   Specimen: Nasopharyngeal Swab  Result Value Ref Range   SARS Coronavirus 2 NEGATIVE NEGATIVE    Comment: (NOTE) If result is NEGATIVE SARS-CoV-2 target nucleic acids are NOT DETECTED. The SARS-CoV-2 RNA is generally detectable in upper and lower  respiratory specimens during the acute phase of infection. The lowest  concentration of SARS-CoV-2 viral copies this assay can detect is 250  copies / mL. A negative result does not preclude SARS-CoV-2 infection  and should not be used as the sole basis for treatment or other  patient management decisions.  A negative result may occur with  improper specimen collection / handling, submission of specimen other  than nasopharyngeal swab, presence of viral mutation(s) within the  areas targeted by this assay, and inadequate number of viral copies  (<250 copies / mL). A negative result must be combined with clinical  observations, patient history, and epidemiological information. If result is POSITIVE SARS-CoV-2 target nucleic acids are DETECTED. The SARS-CoV-2 RNA is generally detectable in upper and lower  respiratory specimens dur ing the acute phase of infection.  Positive  results are indicative of active infection with SARS-CoV-2.  Clinical  correlation with patient history and other diagnostic information is  necessary to determine patient infection status.  Positive results do  not rule out bacterial infection or co-infection with other viruses. If result is PRESUMPTIVE POSTIVE SARS-CoV-2 nucleic acids MAY BE PRESENT.   A presumptive positive result was obtained on the submitted specimen  and confirmed on repeat testing.  While 2019 novel coronavirus  (SARS-CoV-2) nucleic acids may be present in the submitted sample  additional confirmatory testing may be necessary for epidemiological  and / or  clinical management purposes  to differentiate between  SARS-CoV-2 and other Sarbecovirus currently known to infect humans.  If clinically indicated additional testing with an alternate test  methodology [phone]) is advised. The SARS-CoV-2 RNA is generally  detectable in upper and lower respiratory sp ecimens during the acute  phase of infection. The expected result is Negative. Fact Sheet for Patients:  StrictlyIdeas.no Fact Sheet for Healthcare Providers: BankingDealers.co.za This test is not yet approved or cleared by the [place] FDA and has been authorized for detection and/or diagnosis of SARS-CoV-2 by FDA under an Emergency Use Authorization (EUA).  This EUA will remain in effect (meaning this test can be used) for the duration of the COVID-19 declaration under Section 564(b)(1) of the Act, 21 U.S.C. section 360bbb-3(b)(1), unless the authorization is terminated or revoked sooner. Performed at [facility], [place] [address], [place], [place] [address]   D-dimer, quantitative (not at [facility])     Status: Abnormal   Collection Time: 03/22/19  1:31 AM  Result Value Ref Range   D-Dimer, Quant 0.58 (H) 0.00 - 0.50 ug/mL-FEU    Comment: (NOTE) At the manufacturer cut-off of 0.50 ug/mL FEU, this assay has been documented to exclude PE with a sensitivity and negative predictive value of 97 to 99%.  At this time, this assay has not been approved by the FDA to exclude DVT/VTE. Results should be correlated with clinical presentation. Performed at [facility], [place] [address] [place], [place] [address]   Blood culture (routine x 2)  Status: None (Preliminary result)   Collection Time: 03/22/19  2:10 AM   Specimen: BLOOD  Result Value Ref Range   Specimen Description BLOOD RIGHT ANTECUBITAL    Special Requests      BOTTLES DRAWN AEROBIC AND ANAEROBIC Blood Culture results may not be optimal due to an inadequate volume of  blood received in culture bottles Performed at [facility] [address], [place], [place] [address]    Culture PENDING    Report Status PENDING   Lactic acid, plasma     Status: Abnormal   Collection Time: 03/22/19  2:10 AM  Result Value Ref Range   Lactic Acid, Venous 2.1 (HH) 0.5 - 1.9 mmol/L    Comment: CRITICAL RESULT CALLED TO, READ BACK BY AND VERIFIED WITH: FLORES,M RN 03/22/2019 0249 JORDANS Performed at [facility], [place] [address] [place], [place] [address]   I-STAT 7, (LYTES, BLD GAS, ICA, H+H)     Status: Abnormal   Collection Time: 03/22/19  3:19 AM  Result Value Ref Range   pH, Arterial 7.364 7.350 - 7.450   pCO2 arterial 35.8 32.0 - 48.0 mmHg   pO2, Arterial 161.0 (H) 83.0 - 108.0 mmHg   Bicarbonate 20.4 20.0 - 28.0 mmol/L   TCO2 22 22 - 32 mmol/L   O2 Saturation 99.0 %   Acid-base deficit 4.0 (H) 0.0 - 2.0 mmol/L   Sodium 141 135 - 145 mmol/L   Potassium 3.3 (L) 3.5 - 5.1 mmol/L   Calcium, Ion 1.21 1.15 - 1.40 mmol/L   HCT 35.0 (L) 36.0 - 46.0 %   Hemoglobin 11.9 (L) 12.0 - 15.0 g/dL   Patient temperature 98.7 F    Collection site RADIAL, ALLEN'S TEST ACCEPTABLE    Drawn by RT    Sample type ARTERIAL   Lactic acid, plasma     Status: Abnormal   Collection Time: 03/22/19  3:30 AM  Result Value Ref Range   Lactic Acid, Venous 3.7 (HH) 0.5 - 1.9 mmol/L    Comment: CRITICAL RESULT CALLED TO, READ BACK BY AND VERIFIED WITH: FLORES,M RN 03/22/2019 0421 JORDANS Performed at [facility], [address], [place], [place] [address]   Blood culture (routine x 2)     Status: None (Preliminary result)   Collection Time: 03/22/19  3:31 AM   Specimen: BLOOD RIGHT WRIST  Result Value Ref Range   Specimen Description BLOOD RIGHT WRIST    Special Requests      BOTTLES DRAWN AEROBIC ONLY Blood Culture adequate volume Performed at [facility], [place] [address], [place],  [address]    Culture PENDING    Report Status PENDING    Dg  Chest 2 View  Result Date: 03/21/2019 CLINICAL DATA:  Chest pain EXAM: CHEST - 2 VIEW COMPARISON:  None. FINDINGS: Cardiomegaly. Tracheostomy. Both lungs are clear. The visualized skeletal structures are unremarkable. IMPRESSION: Cardiomegaly without acute abnormality of the lungs.  Tracheostomy. Electronically Signed   By: Eddie Candle M.D.   On: 03/21/2019 21:41   Ct Angio Chest Pe W And/or Wo Contrast  Result Date: 03/22/2019 CLINICAL DATA:  Shortness of breath and chest pain with positive D-dimer EXAM: CT ANGIOGRAPHY CHEST WITH CONTRAST TECHNIQUE: Multidetector CT imaging of the chest was performed using the standard protocol during bolus administration of intravenous contrast. Multiplanar CT image reconstructions and MIPs were obtained to evaluate the vascular anatomy. CONTRAST:  68mL OMNIPAQUE IOHEXOL 350 MG/ML SOLN COMPARISON:  10/23/2018 FINDINGS: Cardiovascular:  Cardiomegaly. No pericardial effusion. Suboptimal pulmonary artery visualization due to bolus diffusion and soft tissue attenuation-likely best obtainable. There is no evidence of pulmonary embolism. Negative aorta. Mediastinum/Nodes: Chronically enlarged right upper paratracheal lymph node measuring 2 cm short axis. Lungs/Pleura: Tracheostomy tube in place. Central airways are clear. There is minor dependent atelectasis. There is no edema, consolidation, effusion, or pneumothorax. Upper Abdomen: Prominent hepatic steatosis. Musculoskeletal: No acute or aggressive finding Review of the MIP images confirms the above findings. IMPRESSION: 1. No evidence of pulmonary embolism or other acute finding. 2. Cardiomegaly and hepatic steatosis. Electronically Signed   By: Monte Fantasia M.D.   On: 03/22/2019 04:53    Pending Labs Unresulted Labs (From admission, onward)    Start     Ordered   03/22/19 0522  Urinalysis, Routine w reflex microscopic  ONCE - STAT,   STAT     03/22/19 0521          Vitals/Pain Today's Vitals   03/22/19 0530  03/22/19 0545 03/22/19 0600 03/22/19 0615  BP: 139/79 (!) 146/78 (!) 142/89 134/74  Pulse: (!) 128 (!) 138 (!) 132 (!) 130  Resp: 20 16 20 15   Temp:      TempSrc:      SpO2: 93% 95% 94% 95%  PainSc:        Isolation Precautions No active isolations  Medications Medications  sodium chloride flush (NS) 0.9 % injection 3 mL (has no administration in time range)  morphine 4 MG/ML injection 4 mg (4 mg Intravenous Given 03/22/19 0147)  ondansetron (ZOFRAN) injection 4 mg (4 mg Intravenous Given 03/22/19 0146)  methylPREDNISolone sodium succinate (SOLU-MEDROL) 125 mg/2 mL injection 125 mg (125 mg Intravenous Given 03/22/19 0146)  sodium chloride 0.9 % bolus 1,000 mL (0 mLs Intravenous Stopped 03/22/19 0220)  albuterol (PROVENTIL,VENTOLIN) solution continuous neb (15 mg/hr Nebulization Given 03/22/19 0217)  ipratropium (ATROVENT) nebulizer solution 1 mg (1 mg Nebulization Given 03/22/19 0218)  ipratropium (ATROVENT) 0.02 % nebulizer solution (  Given 03/22/19 0218)  morphine 4 MG/ML injection 4 mg (4 mg Intravenous Given 03/22/19 0413)  albuterol (PROVENTIL) (2.5 MG/3ML) 0.083% nebulizer solution 5 mg (5 mg Nebulization Given 03/22/19 0451)  ipratropium (ATROVENT) nebulizer solution 0.5 mg (0.5 mg Nebulization Given 03/22/19 0450)  iohexol (OMNIPAQUE) 350 MG/ML injection 75 mL (75 mLs Intravenous Contrast Given 03/22/19 0425)  sodium chloride 0.9 % bolus 1,000 mL (0 mLs Intravenous Stopped 03/22/19 0524)  HYDROmorphone (DILAUDID) injection 1 mg (1 mg Intravenous Given 03/22/19 0454)    Mobility walks     Focused Assessments Pulmonary Assessment Handoff:  Lung sounds: Bilateral Breath Sounds: Diminished O2 Device: Room Air        R Recommendations: See Admitting Provider Note  Report given to:   Additional Notes:

## 2019-03-22 NOTE — ED Provider Notes (Signed)
TIME SEEN: 12:23 AM  CHIEF COMPLAINT: Shortness of breath, chest pain  HPI: Patient is a 48 year old female with history of hypertension, insulin-dependent diabetes, asthma, trach dependent who does not use oxygen currently who presents to the emergency department with central and right-sided chest pain that is severe in nature that started yesterday.  States it goes into her right arm.  She is unable to describe the pain.  Denies history of PE or DVT.  No lower extremity swelling or pain.  Reports she has had a productive cough with yellow sputum coming from her trach.  She has tried breathing treatments at home without relief.  She is wheezing.  No history of CAD.  She does not think she has coronavirus but did have a low-grade temperature here of 100.1 on arrival.  She did not have any fevers at home.  ROS: See HPI Constitutional: no fever  Eyes: no drainage  ENT: no runny nose   Cardiovascular:   chest pain  Resp: SOB  GI: no vomiting GU: no dysuria Integumentary: no rash  Allergy: no hives  Musculoskeletal: no leg swelling  Neurological: no slurred speech ROS otherwise negative  PAST MEDICAL HISTORY/PAST SURGICAL HISTORY:  Past Medical History:  Diagnosis Date  . Arthritis   . Asthma   . Chronic back pain   . Chronic chest pain   . Depression   . DM (diabetes mellitus) ([place])    INSULIN DEPENDENT  . GERD (gastroesophageal reflux disease)   . Headache(784.0)   . Hypertension   . Hypokalemia   . Respiratory disease 05/2017  . Tracheostomy in place [facility]) 02/2017    MEDICATIONS:  Prior to Admission medications   Medication Sig Start Date End Date Taking? Authorizing Provider  ACCU-CHEK SOFTCLIX LANCETS lancets Use as instructed to check blood sugar once daily. E11.9 06/18/17   [name], MD  albuterol (PROAIR HFA) 108 (90 Base) MCG/ACT inhaler Inhale 2 puffs into the lungs every 6 (six) hours as needed for wheezing or shortness of breath. 11/22/18   [name], MD   albuterol (PROVENTIL) (2.5 MG/3ML) 0.083% nebulizer solution Take 3 mLs (2.5 mg total) by nebulization every 6 (six) hours as needed for wheezing or shortness of breath. 04/12/18   Brayton Caves, PA-C  amLODipine (NORVASC) 10 MG tablet Take 1 tablet (10 mg total) by mouth daily. 11/22/18   [name], MD  amoxicillin (AMOXIL) 500 MG capsule Take 1 capsule (500 mg total) by mouth 3 (three) times daily. 11/23/18   [name], MD  aspirin-acetaminophen-caffeine (EXCEDRIN MIGRAINE) [phone] MG tablet Take 2 tablets by mouth every 6 (six) hours as needed for headache.    [provider]  atorvastatin (LIPITOR) 20 MG tablet Take 1 tablet (20 mg total) by mouth daily. 11/22/18   [name], MD  benzonatate (TESSALON) 200 MG capsule Take 1 capsule (200 mg total) by mouth 3 (three) times daily as needed for cough. 11/22/18   [name], MD  Blood Glucose Monitoring Suppl (ACCU-CHEK AVIVA PLUS) w/Device KIT Used to check blood sugars once daily. E11.9 06/11/17   [name], MD  budesonide-formoterol (SYMBICORT) 160-4.5 MCG/ACT inhaler Inhale 2 puffs into the lungs 2 (two) times daily. 11/22/18   [name], MD  buPROPion (WELLBUTRIN SR) 150 MG 12 hr tablet Take 1 tablet (150 mg total) by mouth 2 (two) times daily. 11/22/18   [name], MD  carvedilol (COREG) 25 MG tablet Take 1 tablet (25 mg total) by mouth 2 (two) times daily with  a meal. 11/22/18   [name], MD  furosemide (LASIX) 20 MG tablet Take 1 tablet (20 mg total) by mouth daily. 11/22/18   [name], MD  gabapentin (NEURONTIN) 300 MG capsule Take 2 capsules (600 mg total) by mouth 2 (two) times daily. 10/29/18   Domenic Polite, MD  glucose blood (ACCU-CHEK AVIVA) test strip Use as directed to check blood sugars once daily. E11.9 10/08/18   Renato Shin, MD  hydrOXYzine (ATARAX/VISTARIL) 25 MG tablet Take 1 tablet (25 mg total) by mouth 3 (three) times daily as needed for anxiety. 11/22/18   [name], MD   ibuprofen (ADVIL,MOTRIN) 600 MG tablet Take 1 tablet (600 mg total) by mouth every 8 (eight) hours as needed. 11/23/18   [name], MD  Insulin Degludec (TRESIBA FLEXTOUCH) 200 UNIT/ML SOPN Inject 130 Units into the skin daily. Take an extra 10 units while on Prednisone for the next 4days 10/29/18   Domenic Polite, MD  Insulin Pen Needle (FIFTY50 PEN NEEDLES) 31G X 8 MM MISC Inject 1 Units as directed daily. 07/30/18   [provider]  lisinopril (PRINIVIL,ZESTRIL) 5 MG tablet Take 1 tablet (5 mg total) by mouth daily. 11/22/18   [name], MD  Melatonin 3 MG TABS Take 3 mg by mouth at bedtime as needed (for sleep).  10/31/17   [provider]  montelukast (SINGULAIR) 10 MG tablet Take 1 tablet (10 mg total) by mouth at bedtime. 11/22/18   [name], MD  naproxen sodium (ALEVE) 220 MG tablet Take 440 mg by mouth 2 (two) times daily as needed (pain).    [provider]  sertraline (ZOLOFT) 100 MG tablet Take 1 tablet (100 mg total) by mouth at bedtime. 11/22/18   [name], MD  sodium chloride HYPERTONIC 3 % nebulizer solution Take 4 mLs by nebulization 2 (two) times daily. Patient not taking: Reported on 10/25/2018 05/08/17   Aline August, MD  SUMAtriptan (IMITREX) 100 MG tablet At the onset of a migraine; may repeat in 2 hours. Max daily dose 260m 11/22/18   NCharlott Rakes MD  tiZANidine (ZANAFLEX) 4 MG tablet Take 1 tablet (4 mg total) by mouth every 8 (eight) hours as needed for muscle spasms. 11/22/18   NCharlott Rakes MD  traMADol (ULTRAM) 50 MG tablet Take 1 tablet (50 mg total) by mouth 3 (three) times daily as needed. 01/07/19   DMeredith Pel MD  fluticasone-salmeterol (ADVAIR HFA) [phone]/ACT inhaler Inhale 2 puffs into the lungs 2 (two) times daily. 10/15/17 10/15/17  NCharlott Rakes MD    ALLERGIES:  Allergies  Allergen Reactions  . Reglan [Metoclopramide] Other (See Comments)    Panic attack    SOCIAL HISTORY:  Social History    Tobacco Use  . Smoking status: Former Smoker    Packs/day: 0.25    Years: 22.00    Pack years: 5.50    Types: Cigarettes    Quit date: 01/20/2017    Years since quitting: 2.1  . Smokeless tobacco: Never Used  Substance Use Topics  . Alcohol use: No    Alcohol/week: 1.0 - 2.0 standard drinks    Types: 1 - 2 Glasses of wine per week    Comment: socially    FAMILY HISTORY: Family History  Problem Relation Age of Onset  . Heart attack Mother   . Stroke Mother   . Diabetes Mother   . Hypertension Mother   . Arthritis Mother   . Stroke Father   . Hypertension Sister   .  Diabetes Sister   . Seizures Brother   . Diabetes Brother     EXAM: BP (!) 140/92 (BP Location: Right Arm)   Pulse 91   Temp 100.1 F (37.8 C) (Oral)   Resp 18   SpO2 98%  CONSTITUTIONAL: Alert and oriented and responds appropriately to questions.  Obese, tearful, appears anxious HEAD: Normocephalic EYES: Conjunctivae clear, pupils appear equal, EOMI ENT: normal nose; moist mucous membranes NECK: Supple, no meningismus, no nuchal rigidity, no LAD  CARD: Regular and tachycardic into the 130s; S1 and S2 appreciated; no murmurs, no clicks, no rubs, no gallops RESP: Patient is tachypneic.  She is not hypoxic on room air.  She is speaking short sentences and actively coughing.  She has clear sputum coming from her tracheostomy.  She has inspiratory and expiratory wheezes on exam with diminished aeration at her bases.  No rhonchi or rales. ABD/GI: Normal bowel sounds; non-distended; soft, non-tender, no rebound, no guarding, no peritoneal signs, no hepatosplenomegaly BACK:  The back appears normal and is non-tender to palpation, there is no CVA tenderness EXT: Normal ROM in all joints; non-tender to palpation; no edema; normal capillary refill; no cyanosis, no calf tenderness or swelling    SKIN: Normal color for age and race; warm; no rash NEURO: Moves all extremities equally PSYCH: The patient's mood and  manner are appropriate. Grooming and personal hygiene are appropriate.  MEDICAL DECISION MAKING: Patient here with likely asthma exacerbation.  Pneumonia also on the differential given low-grade temperature of 100.1 as is COVID-19.  Her labs show hyperglycemia without DKA.  Blood glucose has improved without intervention.  Her troponin is negative.  I have low suspicion that this is ACS.  Her chest x-ray shows cardiomegaly without infiltrate, edema, pneumothorax.  Will give breathing treatments, Solu-Medrol, obtain ABG.  Will obtain coronavirus testing.  Patient may need admission to the hospital.  Her EKG shows no ischemic abnormality.  ED PROGRESS: Patient's chest x-ray shows no acute abnormality.  D-dimer elevated.  Will obtain CTA of her chest.   Patient still wheezing and tachypneic after continuous treatment.  Now tachycardic.  Will give another breathing treatment in the ED.  Will recheck temperature.  CTA results pending.  Given morphine for pain.   Patient still having 8/10 pain.  Will give dose of Dilaudid.  Lactate has gone up to 3.7.  I suspect that this is more likely a function of her respiratory status but will give second liter of IV fluids.    CT of her chest shows no PE, infiltrate, edema.  Her respiratory status is improving but she is still wheezing and tachypneic.  Will admit for asthma exacerbation.  Patient comfortable with this plan.  6:16 AM Discussed patient's case with hospitalist, Dr. Marlowe Sax.  I have recommended admission and patient (and family if present) agree with this plan. Admitting physician will place admission orders.   I reviewed all nursing notes, vitals, pertinent previous records, EKGs, lab and urine results, imaging (as available).   Chest pain better controlled with Dilaudid.  Pain seems to be atypical in nature and likely chest wall pain from increased work of breathing.  I do not think this is ACS.  She has had 2 negative troponins.    EKG  Interpretation  Date/Time:  Tuesday March 22 2019 00:07:25 EDT Ventricular Rate:  92 PR Interval:    QRS Duration: 85 QT Interval:  376 QTC Calculation: 466 R Axis:   46 Text Interpretation:  Sinus rhythm No significant  change since last tracing Confirmed by Ward, Cyril Mourning [phone]) on 03/22/2019 12:11:16 AM         EKG Interpretation  Date/Time:  Tuesday March 22 2019 04:48:40 EDT Ventricular Rate:  130 PR Interval:    QRS Duration: 82 QT Interval:  278 QTC Calculation: 409 R Axis:   57 Text Interpretation:  Sinus tachycardia Low voltage, precordial leads Nonspecific T abnormalities, diffuse leads Rate faster than previous Confirmed by Ward, Cyril Mourning [phone]) on 03/22/2019 4:53:48 AM        CRITICAL CARE Performed by: Cyril Mourning Ward   Total critical care time: 75 minutes  Critical care time was exclusive of separately billable procedures and treating other patients.  Critical care was necessary to treat or prevent imminent or life-threatening deterioration.  Critical care was time spent personally by me on the following activities: development of treatment plan with patient and/or surrogate as well as nursing, discussions with consultants, evaluation of patient's response to treatment, examination of patient, obtaining history from patient or surrogate, ordering and performing treatments and interventions, ordering and review of laboratory studies, ordering and review of radiographic studies, pulse oximetry and re-evaluation of patient's condition.    Ward, Delice Bison, DO 03/22/19 [phone]
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
## 2019-03-23 DIAGNOSIS — Z794 Long term (current) use of insulin: Secondary | ICD-10-CM | POA: Diagnosis not present

## 2019-03-23 DIAGNOSIS — J398 Other specified diseases of upper respiratory tract: Secondary | ICD-10-CM | POA: Diagnosis present

## 2019-03-23 DIAGNOSIS — K219 Gastro-esophageal reflux disease without esophagitis: Secondary | ICD-10-CM | POA: Diagnosis present

## 2019-03-23 DIAGNOSIS — J9610 Chronic respiratory failure, unspecified whether with hypoxia or hypercapnia: Secondary | ICD-10-CM | POA: Diagnosis not present

## 2019-03-23 DIAGNOSIS — R0902 Hypoxemia: Secondary | ICD-10-CM | POA: Diagnosis not present

## 2019-03-23 DIAGNOSIS — E662 Morbid (severe) obesity with alveolar hypoventilation: Secondary | ICD-10-CM | POA: Diagnosis not present

## 2019-03-23 DIAGNOSIS — Z8249 Family history of ischemic heart disease and other diseases of the circulatory system: Secondary | ICD-10-CM | POA: Diagnosis not present

## 2019-03-23 DIAGNOSIS — Z833 Family history of diabetes mellitus: Secondary | ICD-10-CM | POA: Diagnosis not present

## 2019-03-23 DIAGNOSIS — N179 Acute kidney failure, unspecified: Secondary | ICD-10-CM | POA: Diagnosis not present

## 2019-03-23 DIAGNOSIS — E1142 Type 2 diabetes mellitus with diabetic polyneuropathy: Secondary | ICD-10-CM | POA: Diagnosis present

## 2019-03-23 DIAGNOSIS — Z87891 Personal history of nicotine dependence: Secondary | ICD-10-CM | POA: Diagnosis not present

## 2019-03-23 DIAGNOSIS — E785 Hyperlipidemia, unspecified: Secondary | ICD-10-CM | POA: Diagnosis present

## 2019-03-23 DIAGNOSIS — E1110 Type 2 diabetes mellitus with ketoacidosis without coma: Secondary | ICD-10-CM | POA: Diagnosis not present

## 2019-03-23 DIAGNOSIS — D72829 Elevated white blood cell count, unspecified: Secondary | ICD-10-CM | POA: Diagnosis present

## 2019-03-23 DIAGNOSIS — Z930 Tracheostomy status: Secondary | ICD-10-CM

## 2019-03-23 DIAGNOSIS — Z823 Family history of stroke: Secondary | ICD-10-CM | POA: Diagnosis not present

## 2019-03-23 DIAGNOSIS — J45901 Unspecified asthma with (acute) exacerbation: Secondary | ICD-10-CM | POA: Diagnosis not present

## 2019-03-23 DIAGNOSIS — Z79899 Other long term (current) drug therapy: Secondary | ICD-10-CM | POA: Diagnosis not present

## 2019-03-23 DIAGNOSIS — I10 Essential (primary) hypertension: Secondary | ICD-10-CM | POA: Diagnosis not present

## 2019-03-23 DIAGNOSIS — Z7951 Long term (current) use of inhaled steroids: Secondary | ICD-10-CM | POA: Diagnosis not present

## 2019-03-23 DIAGNOSIS — D649 Anemia, unspecified: Secondary | ICD-10-CM | POA: Diagnosis present

## 2019-03-23 DIAGNOSIS — Z6841 Body Mass Index (BMI) 40.0 and over, adult: Secondary | ICD-10-CM | POA: Diagnosis not present

## 2019-03-23 DIAGNOSIS — Z20828 Contact with and (suspected) exposure to other viral communicable diseases: Secondary | ICD-10-CM | POA: Diagnosis not present

## 2019-03-23 DIAGNOSIS — I119 Hypertensive heart disease without heart failure: Secondary | ICD-10-CM | POA: Diagnosis not present

## 2019-03-23 DIAGNOSIS — F329 Major depressive disorder, single episode, unspecified: Secondary | ICD-10-CM | POA: Diagnosis present

## 2019-03-23 DIAGNOSIS — R079 Chest pain, unspecified: Secondary | ICD-10-CM | POA: Diagnosis not present

## 2019-03-23 DIAGNOSIS — G894 Chronic pain syndrome: Secondary | ICD-10-CM | POA: Diagnosis not present

## 2019-03-23 DIAGNOSIS — R0789 Other chest pain: Secondary | ICD-10-CM | POA: Diagnosis not present

## 2019-03-23 LAB — CBC
HCT: 36 % (ref 36.0–46.0)
Hemoglobin: 11.5 g/dL — ABNORMAL LOW (ref 12.0–15.0)
MCH: 28.3 pg (ref 26.0–34.0)
MCHC: 31.9 g/dL (ref 30.0–36.0)
MCV: 88.5 fL (ref 80.0–100.0)
Platelets: 303 10*3/uL (ref 150–400)
RBC: 4.07 MIL/uL (ref 3.87–5.11)
RDW: 14.3 % (ref 11.5–15.5)
WBC: 15.6 10*3/uL — ABNORMAL HIGH (ref 4.0–10.5)
nRBC: 0.1 % (ref 0.0–0.2)

## 2019-03-23 LAB — BASIC METABOLIC PANEL
Anion gap: 7 (ref 5–15)
Anion gap: 11 (ref 5–15)
Anion gap: 9 (ref 5–15)
BUN: 18 mg/dL (ref 6–20)
BUN: 16 mg/dL (ref 6–20)
BUN: 16 mg/dL (ref 6–20)
CO2: 20 mmol/L — ABNORMAL LOW (ref 22–32)
CO2: 20 mmol/L — ABNORMAL LOW (ref 22–32)
CO2: 21 mmol/L — ABNORMAL LOW (ref 22–32)
Calcium: 8.5 mg/dL — ABNORMAL LOW (ref 8.9–10.3)
Calcium: 8.6 mg/dL — ABNORMAL LOW (ref 8.9–10.3)
Calcium: 8.6 mg/dL — ABNORMAL LOW (ref 8.9–10.3)
Chloride: 108 mmol/L (ref 98–111)
Chloride: 107 mmol/L (ref 98–111)
Chloride: 108 mmol/L (ref 98–111)
Creatinine, Ser: 1.05 mg/dL — ABNORMAL HIGH (ref 0.44–1.00)
Creatinine, Ser: 1.01 mg/dL — ABNORMAL HIGH (ref 0.44–1.00)
Creatinine, Ser: 0.99 mg/dL (ref 0.44–1.00)
GFR calc Af Amer: >60 mL/min (ref >60)
GFR calc Af Amer: >60 mL/min (ref >60)
GFR calc Af Amer: >60 mL/min (ref >60)
GFR calc non Af Amer: >60 mL/min (ref >60)
GFR calc non Af Amer: >60 mL/min (ref >60)
GFR calc non Af Amer: >60 mL/min (ref >60)
Glucose, Bld: 229 mg/dL — ABNORMAL HIGH (ref 70–99)
Glucose, Bld: 160 mg/dL — ABNORMAL HIGH (ref 70–99)
Glucose, Bld: 116 mg/dL — ABNORMAL HIGH (ref 70–99)
Potassium: 4.1 mmol/L (ref 3.5–5.1)
Potassium: 3.9 mmol/L (ref 3.5–5.1)
Potassium: 3.7 mmol/L (ref 3.5–5.1)
Sodium: 135 mmol/L (ref 135–145)
Sodium: 138 mmol/L (ref 135–145)
Sodium: 138 mmol/L (ref 135–145)

## 2019-03-23 LAB — GLUCOSE, CAPILLARY
Glucose-Capillary: 111 mg/dL — ABNORMAL HIGH (ref 70–99)
Glucose-Capillary: 124 mg/dL — ABNORMAL HIGH (ref 70–99)
Glucose-Capillary: 152 mg/dL — ABNORMAL HIGH (ref 70–99)
Glucose-Capillary: 164 mg/dL — ABNORMAL HIGH (ref 70–99)
Glucose-Capillary: 172 mg/dL — ABNORMAL HIGH (ref 70–99)
Glucose-Capillary: 181 mg/dL — ABNORMAL HIGH (ref 70–99)
Glucose-Capillary: 193 mg/dL — ABNORMAL HIGH (ref 70–99)
Glucose-Capillary: 197 mg/dL — ABNORMAL HIGH (ref 70–99)
Glucose-Capillary: 199 mg/dL — ABNORMAL HIGH (ref 70–99)
Glucose-Capillary: 201 mg/dL — ABNORMAL HIGH (ref 70–99)
Glucose-Capillary: 206 mg/dL — ABNORMAL HIGH (ref 70–99)
Glucose-Capillary: 211 mg/dL — ABNORMAL HIGH (ref 70–99)
Glucose-Capillary: 347 mg/dL — ABNORMAL HIGH (ref 70–99)

## 2019-03-23 LAB — LACTIC ACID, PLASMA
Lactic Acid, Venous: 1.9 mmol/L (ref 0.5–1.9)
Lactic Acid, Venous: 1.6 mmol/L (ref 0.5–1.9)

## 2019-03-23 LAB — HIV ANTIBODY (ROUTINE TESTING W REFLEX): HIV Screen 4th Generation wRfx: NONREACTIVE

## 2019-03-23 MED ORDER — INSULIN GLARGINE 100 UNIT/ML ~~LOC~~ SOLN
130.0000 [IU] | Freq: Every day | SUBCUTANEOUS | Status: DC
  Administered 2019-03-23 – 2019-03-25 (×3): 130 [IU] via SUBCUTANEOUS
  Filled 2019-03-23 (×4): qty 1.3

## 2019-03-23 MED ORDER — INSULIN DEGLUDEC 200 UNIT/ML ~~LOC~~ SOPN: 130.0000 [IU] | PEN_INJECTOR | Freq: Every day | SUBCUTANEOUS | Status: DC

## 2019-03-23 MED ORDER — INSULIN ASPART 100 UNIT/ML ~~LOC~~ SOLN
0.0000 [IU] | Freq: Every day | SUBCUTANEOUS | Status: DC
  Administered 2019-03-23: 4 [IU] via SUBCUTANEOUS
  Administered 2019-03-24: 2 [IU] via SUBCUTANEOUS
  Administered 2019-03-25: 3 [IU] via SUBCUTANEOUS

## 2019-03-23 MED ORDER — INSULIN ASPART 100 UNIT/ML ~~LOC~~ SOLN
0.0000 [IU] | Freq: Three times a day (TID) | SUBCUTANEOUS | Status: DC
  Administered 2019-03-23 – 2019-03-24 (×2): 3 [IU] via SUBCUTANEOUS
  Administered 2019-03-24: 1 [IU] via SUBCUTANEOUS
  Administered 2019-03-24: 7 [IU] via SUBCUTANEOUS
  Administered 2019-03-25: 3 [IU] via SUBCUTANEOUS

## 2019-03-23 MED ORDER — ASPIRIN-ACETAMINOPHEN-CAFFEINE 250-250-65 MG PO TABS
1.0000 | ORAL_TABLET | Freq: Once | ORAL | Status: AC
  Administered 2019-03-23: 1 via ORAL
  Filled 2019-03-23: qty 1

## 2019-03-23 NOTE — Evaluation (Signed)
Physical Therapy Evaluation Patient Details Name: [name] MRN: [id number] DOB: 12/16/1970 Today's Date: 03/23/2019   History of Present Illness  48 y/o female with hx ashtma, HTN, severe OSA, DM, morbid obesity, chronic trach, r/t tracheal stenosis presented 6/30 with R sided chest pain, SOB, fever. CTA chest was neg for PE or other acute findings.  CXR unremarkable. Pt also with DKA while admitted.     Clinical Impression  Pt admitted with above diagnosis. Pt currently with functional limitations due to the deficits listed below (see PT Problem List). PTA pt independent with mobility. She is trach dependent at baseline but on RA majority of time. She does have home O2 when needed. On eval, she required supervision bed mobility, transfers and in room ambulation. She presents with good balance and decreased activity tolerance. Pt will benefit from skilled PT to increase their independence and safety with mobility to allow discharge to the venue listed below.  PT to follow acutely. No follow up services or DME indicated.     Follow Up Recommendations No PT follow up;Supervision for mobility/OOB    Equipment Recommendations  None recommended by PT    Recommendations for Other Services       Precautions / Restrictions Precautions Precautions: Fall;Other (comment) Precaution Comments: trach at baseline, pt does not have PMSV with her Restrictions Weight Bearing Restrictions: No      Mobility  Bed Mobility Overal bed mobility: Needs Assistance Bed Mobility: Supine to Sit;Sit to Supine     Supine to sit: Supervision Sit to supine: Supervision   General bed mobility comments: HOB elevated, supervision for lines, safety  Transfers Overall transfer level: Needs assistance Equipment used: None Transfers: Sit to/from [employer] Transfers Sit to Stand: Supervision Stand pivot transfers: Supervision       General transfer comment: supervision for  lines/safety  Ambulation/Gait Ambulation/Gait assistance: Supervision Gait Distance (Feet): 15 Feet Assistive device: None Gait Pattern/deviations: Step-through pattern;Decreased stride length     General Gait Details: Good balance. Able to march in place without assist. Gait limited to in room per pt request due to covid concerns. Pt also declining retrieval of port O2 for increased distance in room. Her husband was trying to call and she was eager to speak with him.  Stairs            Wheelchair Mobility    Modified Rankin (Stroke Patients Only)       Balance Overall balance assessment: No apparent balance deficits (not formally assessed)                                           Pertinent Vitals/Pain Pain Assessment: No/denies pain    Home Living Family/patient expects to be discharged to:: Private residence Living Arrangements: Spouse/significant other;Children Available Help at Discharge: Family;Personal care attendant(home care nurse 3 x week) Type of Home: House Home Access: Stairs to enter Entrance Stairs-Rails: Right Entrance Stairs-Number of Steps: 4 Home Layout: One level Home Equipment: None      Prior Function Level of Independence: Needs assistance   Gait / Transfers Assistance Needed: ambulating without AD, reports she has her "good days and bad days"   ADL's / Homemaking Assistance Needed: spouse and home care nurse assist with ADL PRN including tub transfers  Comments: spouse assist with iADL, pt reports spouse works but she has other family members, including daughter, who check  in and stay with her as well      Hand Dominance   Dominant Hand: Right    Extremity/Trunk Assessment   Upper Extremity Assessment Upper Extremity Assessment: Defer to OT evaluation    Lower Extremity Assessment Lower Extremity Assessment: Generalized weakness       Communication   Communication: Tracheostomy;Passy-Muir valve  Cognition  Arousal/Alertness: Awake/alert Behavior During Therapy: WFL for tasks assessed/performed Overall Cognitive Status: Within Functional Limits for tasks assessed                                        General Comments General comments (skin integrity, edema, etc.): Pt on 5 L O2 via trach collar. SpO2 >95% during session.    Exercises     Assessment/Plan    PT Assessment Patient needs continued PT services  PT Problem List Decreased strength;Decreased mobility;Decreased activity tolerance       PT Treatment Interventions Therapeutic activities;Gait training;Therapeutic exercise;Patient/family education;Stair training;Functional mobility training    PT Goals (Current goals can be found in the Care Plan section)  Acute Rehab PT Goals Patient Stated Goal: home  PT Goal Formulation: With patient Time For Goal Achievement: 03/30/19 Potential to Achieve Goals: Good    Frequency Min 3X/week   Barriers to discharge        Co-evaluation               AM-PAC PT "6 Clicks" Mobility  Outcome Measure Help needed turning from your back to your side while in a flat bed without using bedrails?: None Help needed moving from lying on your back to sitting on the side of a flat bed without using bedrails?: None Help needed moving to and from a bed to a chair (including a wheelchair)?: None Help needed standing up from a chair using your arms (e.g., wheelchair or bedside chair)?: None Help needed to walk in hospital room?: A Little Help needed climbing 3-5 steps with a railing? : A Little 6 Click Score: 22    End of Session Equipment Utilized During Treatment: Oxygen Activity Tolerance: Patient tolerated treatment well Patient left: in bed;with call bell/phone within reach Nurse Communication: Mobility status PT Visit Diagnosis: Muscle weakness (generalized) (M62.81)    Time: 9935-7017 PT Time Calculation (min) (ACUTE ONLY): 24 min   Charges:   PT  Evaluation $PT Eval Low Complexity: 1 Low PT Treatments $Therapeutic Activity: 8-22 mins        Lorrin Goodell, PT  Office # [phone] Pager [phone]   Lorriane Shire 03/23/2019, 12:32 PM

## 2019-03-23 NOTE — Progress Notes (Signed)
PROGRESS NOTE    [name]  DPO:[id number] DOB: 1971-09-17 DOA: 03/21/2019 PCP: [name], MD    Brief Narrative:  [name] is a 48 y.o. female with medical history significant of trach dependence due to tracheal stenosis; asthma; HTN; severe OSA; morbid obesity (BMI 48); DM; and chronic pain presenting with SOB/CP, mostly on the right side of the chest.   Pt is currently on 5 lit of oxygen via trach collar.   Patient admitted for acute asthma exacerbation Assessment & Plan:   Principal Problem:   Chest pain of uncertain etiology Active Problems:   Sleep apnea, obstructive   Essential hypertension   Type 2 diabetes mellitus with diabetic polyneuropathy, without long-term current use of insulin (HCC)   Morbid obesity (HCC)   Chronic pain syndrome   AKI (acute kidney injury) ([place])   Tracheostomy dependent (HCC)   Acute asthma exacerbation   Lactic acidosis   Right-sided chest pain Intermittent associated with deep breathing. Differential include pleuritis versus musculoskeletal pain. Pain control CTA of the chest is nondiagnostic. Appreciate PCCM input.   Tracheostomy dependence/acute asthma exacerbation -On oxygen via trach collar. Wean in the next 24 hours as appropriate. No wheezing heard at this time Afebrile, leukocytosis. Continue with Rocephin and Zithromax for acute purulent bronchitis Continue with bronchodilators and Singulair. As per PCCM maintain for cuffless [place].  Diabetes mellitus with hyperglycemia/DKA Briefly on insulin gtt., transition to long-acting subcutaneous insulin. Hemoglobin A1c of 13.7 Anion gap closed Lactic acid normalized Bicarbonate improved to 21.    Acute kidney injury probably secondary to DKA and lactic acidosis. Improved with hydration.   Mild normocytic anemia Hemoglobin stable around 11.  Hypertension Well-controlled   Hyperlipidemia Continue with statin   DVT  prophylaxis: Lovenox Code Status: Full code Family Communication: None at bedside, discussed the plan with the patient Disposition Plan: Pending improvement possible discharge in the next 1 to 2 days if we can wean her off the oxygen.  Consultants:   PCCM   Procedures: None  Antimicrobials: Rocephin and Zithromax  Subjective: Patient continues to have some right-sided chest pain with deep breathing. Requesting to eat.  Patient denies any nausea vomiting.  Objective: Vitals:   03/23/19 1131 03/23/19 1447 03/23/19 1503 03/23/19 1516  BP:    (!) 149/91  Pulse:    84  Resp:    16  Temp:    98.9 F (37.2 C)  TempSrc:    Oral  SpO2: 100% 99% 99% 96%  Weight:      Height:       No intake or output data in the 24 hours ending 03/23/19 1807 Filed Weights   03/22/19 0900  Weight: 115.3 kg    Examination:  General exam: Appears calm and comfortable  Respiratory system: Clear to auscultation. Respiratory effort normal. Cardiovascular system: S1 & S2 heard, RRR. No JVD, murmurs, rubs, gallops or clicks. No pedal edema. Gastrointestinal system: Abdomen is nondistended, soft and nontender. No organomegaly or masses felt. Normal bowel sounds heard. Central nervous system: Alert and oriented. No focal neurological deficits. Extremities: Symmetric 5 x 5 power. Skin: No rashes, lesions or ulcers Psychiatry: Judgement and insight appear normal. Mood & affect appropriate.     Data Reviewed: I have personally reviewed following labs and imaging studies  CBC: Recent Labs  Lab 03/21/19 2035 03/22/19 0319 03/23/19 0757  WBC 8.8  --  15.6*  HGB 11.9* 11.9* 11.5*  HCT 37.2 35.0* 36.0  MCV 87.1  --  88.5  PLT 310  --  774   Basic Metabolic Panel: Recent Labs  Lab 03/22/19 1908 03/22/19 2213 03/23/19 0202 03/23/19 0757 03/23/19 1203  NA 136 138 135 138 138  K 4.1 4.3 4.1 3.9 3.7  CL 108 110 108 107 108  CO2 18* 19* 20* 20* 21*  GLUCOSE 295* 202* 229* 160* 116*  BUN 16  17 18 16 16   CREATININE 1.37* 1.12* 1.05* 1.01* 0.99  CALCIUM 8.4* 8.6* 8.5* 8.6* 8.6*   GFR: Estimated Creatinine Clearance: 81.2 mL/min (by C-G formula based on SCr of 0.99 mg/dL). Liver Function Tests: Recent Labs  Lab 03/22/19 1029 03/22/19 1415  AST 30 26  ALT 31 29  ALKPHOS 107 99  BILITOT 0.5 0.3  PROT 7.9 7.2  ALBUMIN 3.7 3.4*   No results for input(s): LIPASE, AMYLASE in the last 168 hours. No results for input(s): AMMONIA in the last 168 hours. Coagulation Profile: No results for input(s): INR, PROTIME in the last 168 hours. Cardiac Enzymes: Recent Labs  Lab 03/22/19 1415  CKTOTAL 85   BNP (last 3 results) No results for input(s): PROBNP in the last 8760 hours. HbA1C: Recent Labs    03/22/19 1415  HGBA1C 13.7*   CBG: Recent Labs  Lab 03/23/19 0848 03/23/19 0945 03/23/19 1117 03/23/19 1223 03/23/19 1611  GLUCAP 193* 172* 124* 111* 201*   Lipid Profile: No results for input(s): CHOL, HDL, LDLCALC, TRIG, CHOLHDL, LDLDIRECT in the last 72 hours. Thyroid Function Tests: No results for input(s): TSH, T4TOTAL, FREET4, T3FREE, THYROIDAB in the last 72 hours. Anemia Panel: No results for input(s): VITAMINB12, FOLATE, FERRITIN, TIBC, IRON, RETICCTPCT in the last 72 hours. Sepsis Labs: Recent Labs  Lab 03/22/19 1415 03/22/19 1908 03/23/19 1203 03/23/19 1541  LATICACIDVEN 6.5* 3.2* 1.9 1.6    Recent Results (from the past 240 hour(s))  SARS Coronavirus 2 (CEPHEID- Performed in [place] hospital lab), Hosp Order     Status: None   Collection Time: 03/22/19  1:29 AM   Specimen: Nasopharyngeal Swab  Result Value Ref Range Status   SARS Coronavirus 2 NEGATIVE NEGATIVE Final    Comment: (NOTE) If result is NEGATIVE SARS-CoV-2 target nucleic acids are NOT DETECTED. The SARS-CoV-2 RNA is generally detectable in upper and lower  respiratory specimens during the acute phase of infection. The lowest  concentration of SARS-CoV-2 viral copies this assay  can detect is 250  copies / mL. A negative result does not preclude SARS-CoV-2 infection  and should not be used as the sole basis for treatment or other  patient management decisions.  A negative result may occur with  improper specimen collection / handling, submission of specimen other  than nasopharyngeal swab, presence of viral mutation(s) within the  areas targeted by this assay, and inadequate number of viral copies  (<250 copies / mL). A negative result must be combined with clinical  observations, patient history, and epidemiological information. If result is POSITIVE SARS-CoV-2 target nucleic acids are DETECTED. The SARS-CoV-2 RNA is generally detectable in upper and lower  respiratory specimens dur ing the acute phase of infection.  Positive  results are indicative of active infection with SARS-CoV-2.  Clinical  correlation with patient history and other diagnostic information is  necessary to determine patient infection status.  Positive results do  not rule out bacterial infection or co-infection with other viruses. If result is PRESUMPTIVE POSTIVE SARS-CoV-2 nucleic acids MAY BE PRESENT.   A presumptive positive result was obtained on the submitted specimen  and  confirmed on repeat testing.  While 2019 novel coronavirus  (SARS-CoV-2) nucleic acids may be present in the submitted sample  additional confirmatory testing may be necessary for epidemiological  and / or clinical management purposes  to differentiate between  SARS-CoV-2 and other Sarbecovirus currently known to infect humans.  If clinically indicated additional testing with an alternate test  methodology [phone]) is advised. The SARS-CoV-2 RNA is generally  detectable in upper and lower respiratory sp ecimens during the acute  phase of infection. The expected result is Negative. Fact Sheet for Patients:  StrictlyIdeas.no Fact Sheet for Healthcare Providers:  BankingDealers.co.za This test is not yet approved or cleared by the [place] FDA and has been authorized for detection and/or diagnosis of SARS-CoV-2 by FDA under an Emergency Use Authorization (EUA).  This EUA will remain in effect (meaning this test can be used) for the duration of the COVID-19 declaration under Section 564(b)(1) of the Act, 21 U.S.C. section 360bbb-3(b)(1), unless the authorization is terminated or revoked sooner. Performed at [facility], [place] [address], [place], [place] [address]   Blood culture (routine x 2)     Status: None (Preliminary result)   Collection Time: 03/22/19  2:10 AM   Specimen: BLOOD  Result Value Ref Range Status   Specimen Description BLOOD RIGHT ANTECUBITAL  Final   Special Requests   Final    BOTTLES DRAWN AEROBIC AND ANAEROBIC Blood Culture results may not be optimal due to an inadequate volume of blood received in culture bottles   Culture   Final    NO GROWTH 1 DAY Performed at [facility], [place] [address], [place], [place] [address]    Report Status PENDING  Incomplete  Blood culture (routine x 2)     Status: None (Preliminary result)   Collection Time: 03/22/19  3:31 AM   Specimen: BLOOD RIGHT WRIST  Result Value Ref Range Status   Specimen Description BLOOD RIGHT WRIST  Final   Special Requests   Final    BOTTLES DRAWN AEROBIC ONLY Blood Culture adequate volume   Culture   Final    NO GROWTH 1 DAY Performed at [facility], [place] [address] [place], [place] [address]    Report Status PENDING  Incomplete         Radiology Studies: Dg Chest 2 View  Result Date: 03/21/2019 CLINICAL DATA:  Chest pain EXAM: CHEST - 2 VIEW COMPARISON:  None. FINDINGS: Cardiomegaly. Tracheostomy. Both lungs are clear. The visualized skeletal structures are unremarkable. IMPRESSION: Cardiomegaly without acute abnormality of the lungs.  Tracheostomy. Electronically Signed   By: Eddie Candle M.D.   On:  03/21/2019 21:41   Ct Angio Chest Pe W And/or Wo Contrast  Result Date: 03/22/2019 CLINICAL DATA:  Shortness of breath and chest pain with positive D-dimer EXAM: CT ANGIOGRAPHY CHEST WITH CONTRAST TECHNIQUE: Multidetector CT imaging of the chest was performed using the standard protocol during bolus administration of intravenous contrast. Multiplanar CT image reconstructions and MIPs were obtained to evaluate the vascular anatomy. CONTRAST:  9mL OMNIPAQUE IOHEXOL 350 MG/ML SOLN COMPARISON:  10/23/2018 FINDINGS: Cardiovascular: Cardiomegaly. No pericardial effusion. Suboptimal pulmonary artery visualization due to bolus diffusion and soft tissue attenuation-likely best obtainable. There is no evidence of pulmonary embolism. Negative aorta. Mediastinum/Nodes: Chronically enlarged right upper paratracheal lymph node measuring 2 cm short axis. Lungs/Pleura: Tracheostomy tube in place. Central airways are clear. There is minor dependent atelectasis. There is no edema, consolidation, effusion, or pneumothorax. Upper Abdomen:  Prominent hepatic steatosis. Musculoskeletal: No acute or aggressive finding Review of the MIP images confirms the above findings. IMPRESSION: 1. No evidence of pulmonary embolism or other acute finding. 2. Cardiomegaly and hepatic steatosis. Electronically Signed   By: Monte Fantasia M.D.   On: 03/22/2019 04:53        Scheduled Meds: . amLODipine  10 mg Oral Daily  . atorvastatin  20 mg Oral Daily  . buPROPion  150 mg Oral BID  . carvedilol  25 mg Oral BID WC  . enoxaparin (LOVENOX) injection  40 mg Subcutaneous Q24H  . gabapentin  600 mg Oral BID  . insulin aspart  0-5 Units Subcutaneous QHS  . insulin aspart  0-9 Units Subcutaneous TID WC  . insulin glargine  130 Units Subcutaneous Daily  . ipratropium-albuterol  3 mL Nebulization TID  . mometasone-formoterol  2 puff Inhalation BID  . montelukast  10 mg Oral QHS  . predniSONE  40 mg Oral Q breakfast  . sertraline  100 mg  Oral QHS  . sodium chloride flush  3 mL Intravenous Once   Continuous Infusions: . azithromycin 500 mg (03/23/19 1647)  . cefTRIAXone (ROCEPHIN)  IV 2 g (03/23/19 1538)  . insulin Stopped (03/23/19 1532)     LOS: 0 days    Time spent: 38 minutes    Hosie Poisson, MD [facility] Pager [phone]  If 7PM-7AM, please contact night-coverage [email or web address] Password [facility] 03/23/2019, 6:07 PM

## 2019-03-23 NOTE — Progress Notes (Addendum)
Inpatient Diabetes Program Recommendations  AACE/ADA: New Consensus Statement on Inpatient Glycemic Control (2015)  Target Ranges:  Prepandial:   less than 140 mg/dL      Peak postprandial:   less than 180 mg/dL (1-2 hours)      Critically ill patients:  140 - 180 mg/dL    Review of Glycemic Control  Diabetes history: Type 2 DM Outpatient Diabetes medications: Tresiba 130 units QD  Current orders for Inpatient glycemic control:  IV insulin gtt  Inpatient Diabetes Program Recommendations:     CO2 back up and anion gap normal. IV insulin gtt rates high 6-10 units/hour to maintain glucose in the 100's.   Patient now on PO prednisone 40 mg Daily. Consider Lantus 65 units bid, Novolog 0-20 units tid, Novolog 0-5 units qhs.  A1c 13.7%. Has not seen Endocrinologist since Dec 2019.  Addendum 1343 pm:  Spoke with patient at bedside regarding A1c and insulin regimen at home. Patient sees an Musician and has an appointment on July 7th. Spoke with patient about her 13.7% A1c. Patient reports her glucose has been in the 300-400 range at home. Patient denies being on steroids prior to admission.  Patient reports her weakness being regular Pepsi and drinks 1 6 pack at least a day. Discussed diet beverages and need for glucose control.   Thanks, Tama Headings RN, MSN, BC-ADM Inpatient Diabetes Coordinator Team Pager [phone] (8a-5p)
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
## 2019-03-23 NOTE — Evaluation (Signed)
Occupational Therapy Evaluation Patient Details Name: [name] MRN: [id number] DOB: 12-Sep-1971 Today's Date: 03/23/2019    History of Present Illness 48 y/o female with hx ashtma, HTN, severe OSA, DM, morbid obesity, chronic trach, r/t tracheal stenosis presented 6/30 with R sided chest pain, SOB, fever. CTA chest was neg for PE or other acute findings.  CXR unremarkable. Pt also with DKA while admitted.    Clinical Impression   This 48 y/o female presents with the above. PTA pt reports independent with functional mobility, reports she receives intermittent assist from spouse, home care nurse for ADL/iADL needs. Pt performing functional transfers this session at supervision level without AD; she currently requires setup/supervision assist for toileting and seated UB ADL, minA for LB ADL. Pt on supplemental O2 via trach collar this session (5L, FiO2 28%), with O2 sats maintaining >95% throughout activity. She will benefit from continued acute OT services, currently recommend follow up [place] services at time of discharge to maximize her safety and independence with ADL and mobility. Will follow.     Follow Up Recommendations  Home health OT;Supervision/Assistance - 24 hour(HH vs no OT follow up)    Equipment Recommendations  3 in 1 bedside commode           Precautions / Restrictions Precautions Precautions: Fall Precaution Comments: trach at baseline, pt does not have PMSV with her Restrictions Weight Bearing Restrictions: No      Mobility Bed Mobility Overal bed mobility: Needs Assistance Bed Mobility: Supine to Sit     Supine to sit: Supervision     General bed mobility comments: HOB elevated, supervision for lines, safety  Transfers Overall transfer level: Needs assistance Equipment used: None Transfers: Sit to/from Stand;Stand Pivot Transfers Sit to Stand: Supervision Stand pivot transfers: Supervision       General transfer comment: transferring  to/from [facility] during this session with supervision for lines/safety, no physical assist required or LOB noted    Balance Overall balance assessment: No apparent balance deficits (not formally assessed)                                         ADL either performed or assessed with clinical judgement   ADL Overall ADL's : Needs assistance/impaired     Grooming: Set up;Sitting   Upper Body Bathing: Set up;Sitting   Lower Body Bathing: Minimal assistance;Sit to/from stand   Upper Body Dressing : Set up;Sitting   Lower Body Dressing: Minimal assistance;Sit to/from stand   Toilet Transfer: Supervision/safety;BSC;Stand-pivot   Toileting- Water quality scientist and Hygiene: Supervision/safety;Sit to/from stand;Sitting/lateral lean Toileting - Clothing Manipulation Details (indicate cue type and reason): pt performing gown management/peri-care without assist     Functional mobility during ADLs: Supervision/safety General ADL Comments: educated pt on benefits of 3:1 including using as shower chair, next to bed for toileting needs when having her "off days" pt verbalizing understanding                         Pertinent Vitals/Pain Pain Assessment: No/denies pain     Hand Dominance     Extremity/Trunk Assessment Upper Extremity Assessment Upper Extremity Assessment: Generalized weakness   Lower Extremity Assessment Lower Extremity Assessment: Defer to PT evaluation       Communication Communication Communication: Tracheostomy;Passy-Muir valve(pt doesn't have PMV with her)   Cognition Arousal/Alertness: Awake/alert Behavior During Therapy: WFL for tasks  assessed/performed Overall Cognitive Status: Within Functional Limits for tasks assessed                                     General Comments  pt with supplemental O2 via trach collar on 5L this session, SpO2 >95% throughout activity    Exercises     Shoulder Instructions      Home  Living Family/patient expects to be discharged to:: Private residence Living Arrangements: Spouse/significant other;Children Available Help at Discharge: Family;Personal care attendant(home care nurse 3x/week) Type of Home: House Home Access: Stairs to enter Technical brewer of Steps: 4 Entrance Stairs-Rails: Right Home Layout: One level     Bathroom Shower/Tub: Teacher, early years/pre: Standard     Home Equipment: None          Prior Functioning/Environment Level of Independence: Needs assistance  Gait / Transfers Assistance Needed: ambulating without AD, reports she has her "good days and bad days"  ADL's / Homemaking Assistance Needed: spouse and home care nurse assist with ADL PRN including tub transfers   Comments: spouse assist with iADL, pt reports spouse works but she has other family members, including daughter, who check in and stay with her as well         OT Problem List: Decreased strength;Decreased activity tolerance;Obesity;Cardiopulmonary status limiting activity;Decreased knowledge of use of DME or AE      OT Treatment/Interventions: Self-care/ADL training;Therapeutic exercise;Neuromuscular education;Energy conservation;DME and/or AE instruction;Therapeutic activities;Patient/family education;Balance training    OT Goals(Current goals can be found in the care plan section) Acute Rehab OT Goals Patient Stated Goal: home  OT Goal Formulation: With patient Time For Goal Achievement: 04/06/19 Potential to Achieve Goals: Good  OT Frequency: Min 2X/week   Barriers to D/C:            Co-evaluation              AM-PAC OT "6 Clicks" Daily Activity     Outcome Measure Help from another person eating meals?: None Help from another person taking care of personal grooming?: None Help from another person toileting, which includes using toliet, bedpan, or urinal?: None Help from another person bathing (including washing, rinsing, drying)?:  A Little Help from another person to put on and taking off regular upper body clothing?: None Help from another person to put on and taking off regular lower body clothing?: A Little 6 Click Score: 22   End of Session Equipment Utilized During Treatment: Oxygen Nurse Communication: Mobility status  Activity Tolerance: Patient tolerated treatment well Patient left: in bed;with call bell/phone within reach(seated EOB)  OT Visit Diagnosis: Muscle weakness (generalized) (M62.81)                Time: 6834-1962 OT Time Calculation (min): 15 min Charges:  OT General Charges $OT Visit: 1 Visit OT Evaluation $OT Eval Moderate Complexity: 1 Mod  Lou Cal, OT [employer] Pager [phone] Office [phone]   Raymondo Band 03/23/2019, 11:07 AM

## 2019-03-24 ENCOUNTER — Encounter (HOSPITAL_COMMUNITY): Payer: Self-pay

## 2019-03-24 DIAGNOSIS — N179 Acute kidney failure, unspecified: Secondary | ICD-10-CM | POA: Diagnosis not present

## 2019-03-24 DIAGNOSIS — J45901 Unspecified asthma with (acute) exacerbation: Secondary | ICD-10-CM | POA: Diagnosis not present

## 2019-03-24 DIAGNOSIS — I10 Essential (primary) hypertension: Secondary | ICD-10-CM | POA: Diagnosis not present

## 2019-03-24 DIAGNOSIS — R0789 Other chest pain: Secondary | ICD-10-CM | POA: Diagnosis not present

## 2019-03-24 DIAGNOSIS — R0902 Hypoxemia: Secondary | ICD-10-CM | POA: Diagnosis not present

## 2019-03-24 DIAGNOSIS — Z930 Tracheostomy status: Secondary | ICD-10-CM | POA: Diagnosis not present

## 2019-03-24 LAB — CBC
HCT: 36.1 % (ref 36.0–46.0)
Hemoglobin: 11.7 g/dL — ABNORMAL LOW (ref 12.0–15.0)
MCH: 28.5 pg (ref 26.0–34.0)
MCHC: 32.4 g/dL (ref 30.0–36.0)
MCV: 88 fL (ref 80.0–100.0)
Platelets: 332 10*3/uL (ref 150–400)
RBC: 4.1 MIL/uL (ref 3.87–5.11)
RDW: 14.6 % (ref 11.5–15.5)
WBC: 12.7 10*3/uL — ABNORMAL HIGH (ref 4.0–10.5)
nRBC: 0 % (ref 0.0–0.2)

## 2019-03-24 LAB — GLUCOSE, CAPILLARY
Glucose-Capillary: 135 mg/dL — ABNORMAL HIGH (ref 70–99)
Glucose-Capillary: 202 mg/dL — ABNORMAL HIGH (ref 70–99)
Glucose-Capillary: 323 mg/dL — ABNORMAL HIGH (ref 70–99)
Glucose-Capillary: 249 mg/dL — ABNORMAL HIGH (ref 70–99)

## 2019-03-24 LAB — BASIC METABOLIC PANEL
Anion gap: 10 (ref 5–15)
BUN: 19 mg/dL (ref 6–20)
CO2: 22 mmol/L (ref 22–32)
Calcium: 8.6 mg/dL — ABNORMAL LOW (ref 8.9–10.3)
Chloride: 108 mmol/L (ref 98–111)
Creatinine, Ser: 1.08 mg/dL — ABNORMAL HIGH (ref 0.44–1.00)
GFR calc Af Amer: >60 mL/min (ref >60)
GFR calc non Af Amer: >60 mL/min (ref >60)
Glucose, Bld: 212 mg/dL — ABNORMAL HIGH (ref 70–99)
Potassium: 3.7 mmol/L (ref 3.5–5.1)
Sodium: 140 mmol/L (ref 135–145)

## 2019-03-24 LAB — LEGIONELLA PNEUMOPHILA SEROGP 1 UR AG: L. pneumophila Serogp 1 Ur Ag: NEGATIVE

## 2019-03-24 MED ORDER — AZITHROMYCIN 250 MG PO TABS
500.0000 mg | ORAL_TABLET | Freq: Every day | ORAL | Status: AC
  Administered 2019-03-24 – 2019-03-26 (×3): 500 mg via ORAL
  Filled 2019-03-24 (×3): qty 2

## 2019-03-24 MED ORDER — HYDROCODONE-ACETAMINOPHEN 5-325 MG PO TABS
2.0000 | ORAL_TABLET | Freq: Once | ORAL | Status: AC
  Administered 2019-03-24: 2 via ORAL
  Filled 2019-03-24: qty 2

## 2019-03-24 MED ORDER — HYDROCODONE-ACETAMINOPHEN 5-325 MG PO TABS
1.0000 | ORAL_TABLET | Freq: Four times a day (QID) | ORAL | Status: DC | PRN
  Administered 2019-03-24 – 2019-03-25 (×3): 2 via ORAL
  Filled 2019-03-24 (×2): qty 1
  Filled 2019-03-24 (×2): qty 2

## 2019-03-24 MED ORDER — MORPHINE SULFATE (PF) 2 MG/ML IV SOLN
2.0000 mg | Freq: Once | INTRAVENOUS | Status: AC
  Administered 2019-03-24: 2 mg via INTRAVENOUS
  Filled 2019-03-24: qty 1

## 2019-03-24 NOTE — TOC Initial Note (Signed)
Transition of Care [facility]) - Initial/Assessment Note    Patient Details  Name: [name] MRN: [id number] Date of Birth: [birth year]  [facility]) CM/SW Contact:    Zenon Mayo, RN Phone Number: 03/24/2019, 4:33 PM  Clinical Narrative:                 From home with spouse and daughter, she states they have been doing her trach care for two years, she has shipmans services also. NCM offered choice for [facility] , she states she does not want [place] services.    Expected Discharge Plan: [place] Barriers to Discharge: No Barriers Identified   Patient Goals and CMS Choice Patient states their goals for this hospitalization and ongoing recovery are:: go home CMS Medicare.gov Compare Post Acute Care list provided to:: Patient Choice offered to / list presented to : Patient  Expected Discharge Plan and Services Expected Discharge Plan: [place]   Discharge Planning Services: CM Consult Post Acute Care Choice: NA Living arrangements for the past 2 months: Single Family Home                 DME Arranged: (NA)         HH Arranged: NA          Prior Living Arrangements/Services Living arrangements for the past 2 months: Single Family Home Lives with:: Spouse Patient language and need for interpreter reviewed:: Yes Do you feel safe going back to the place where you live?: Yes      Need for Family Participation in Patient Care: Yes (Comment) Care giver support system in place?: Yes (comment)   Criminal Activity/Legal Involvement Pertinent to Current Situation/Hospitalization: No - Comment as needed  Activities of Daily Living Home Assistive Devices/Equipment: Eyeglasses, Vent/Trach supplies ADL Screening (condition at time of admission) Patient's cognitive ability adequate to safely complete daily activities?: Yes Is the patient deaf or have difficulty hearing?: No Does the patient have difficulty seeing,  even when wearing glasses/contacts?: No Does the patient have difficulty concentrating, remembering, or making decisions?: No Patient able to express need for assistance with ADLs?: Yes Does the patient have difficulty dressing or bathing?: Yes Independently performs ADLs?: No Communication: Independent Dressing (OT): Needs assistance Is this a change from baseline?: Pre-admission baseline Grooming: Needs assistance Is this a change from baseline?: Pre-admission baseline Feeding: Independent Bathing: Needs assistance Is this a change from baseline?: Pre-admission baseline Toileting: Independent In/Out Bed: Independent Walks in Home: Independent Does the patient have difficulty walking or climbing stairs?: Yes Weakness of Legs: Both Weakness of Arms/Hands: Both  Permission Sought/Granted                  Emotional Assessment Appearance:: Appears stated age Attitude/Demeanor/Rapport: Gracious Affect (typically observed): Appropriate Orientation: : Oriented to Place, Oriented to Self, Oriented to  Time, Oriented to Situation Alcohol / Substance Use: Not Applicable Psych Involvement: No (comment)  Admission diagnosis:  Atypical chest pain [R07.89] Exacerbation of intermittent asthma, unspecified asthma severity [J45.21] Patient Active Problem List   Diagnosis Date Noted  . Acute asthma exacerbation 03/22/2019  . Chest pain of uncertain etiology 33/54/5625  . Lactic acidosis 03/22/2019  . Chronic right shoulder pain   . Community acquired pneumonia 10/25/2018  . Asthma, chronic, unspecified asthma severity, with acute exacerbation 10/25/2018  . Acute respiratory failure with hypoxia and hypercapnia ([place]) 10/23/2018  . Acute pain of right shoulder due to trauma 10/05/2018  . Chronic  bilateral low back pain without sciatica 10/05/2018  . Dysphonia 10/05/2018  . Severe asthma with exacerbation 10/05/2018  . Rotator cuff arthropathy, right 06/15/2018  . AKI (acute kidney  injury) ([place]) 03/06/2018  . Carpal tunnel syndrome 01/18/2018  . Normocytic anemia 05/06/2017  . Tracheostomy status ([place]) 04/28/2017  . Tracheostomy dependent ([place]) 03/26/2017  . Subglottic stenosis 03/06/2017  . Tracheal stenosis 03/04/2017  . Acute blood loss anemia   . Generalized anxiety disorder   . Hypoalbuminemia due to protein-calorie malnutrition ([place])   . Type 2 diabetes mellitus with diabetic polyneuropathy, without long-term current use of insulin ([place])   . Morbid obesity ([place])   . Chronic pain syndrome   . Gastritis and gastroduodenitis 02/22/2016  . Depression 02/22/2016  . Migraine 01/30/2015  . Bulging lumbar disc 09/05/2013  . Essential hypertension 09/05/2013  . Allergic rhinitis, seasonal 08/11/2012  . Chronic cough 08/11/2012  . Sleep apnea, obstructive 12/04/2011   PCP:  [name], MD Pharmacy:   [place], [place] [address] Phone: [phone] Fax: [phone]  [place] [address] [place]), [place] - [address] [place] ([place]) [place] [address] Phone: [phone] Fax: [phone]     Social Determinants of Health (SDOH) Interventions    Readmission Risk Interventions No flowsheet data found.

## 2019-03-24 NOTE — Progress Notes (Signed)
ATC setup changed 

## 2019-03-24 NOTE — Progress Notes (Signed)
PROGRESS NOTE    [name]  WUJ:[id number] DOB: 1971/03/16 DOA: 03/21/2019 PCP: [name], MD    Brief Narrative:  [name] is a 48 y.o. female with medical history significant of trach dependence due to tracheal stenosis; asthma; HTN; severe OSA; morbid obesity (BMI 48); DM; and chronic pain presenting with SOB/CP, mostly on the right side of the chest.   Pt is currently on 5 lit of oxygen via trach collar.   Patient admitted for acute asthma exacerbation.  Assessment & Plan:   Principal Problem:   Chest pain of uncertain etiology Active Problems:   Sleep apnea, obstructive   Essential hypertension   Type 2 diabetes mellitus with diabetic polyneuropathy, without long-term current use of insulin (HCC)   Morbid obesity (HCC)   Chronic pain syndrome   AKI (acute kidney injury) ([place])   Tracheostomy dependent (HCC)   Acute asthma exacerbation   Lactic acidosis   Right-sided chest pain Intermittent associated with deep breathing. Differential include pleuritis versus musculoskeletal pain. Pain control CTA of the chest is nondiagnostic. Appreciate PCCM input.   Tracheostomy dependence/acute asthma exacerbation -On oxygen via trach collar. Wean in the next 24 hours as appropriate. No wheezing heard at this time Afebrile, leukocytosis is improving.  Continue with Rocephin and Zithromax for acute purulent bronchitis Continue with bronchodilators and Singulair. As per PCCM maintain for cuffless Shiley.  Diabetes mellitus with hyperglycemia/DKA Briefly on insulin gtt., transition to long-acting subcutaneous insulin. Hemoglobin A1c of 13.7 Anion gap closed Lactic acid normalized Bicarbonate improved to 21. CBG (last 3)  Recent Labs    03/23/19 2055 03/24/19 0745 03/24/19 1220  GLUCAP 347* 135* 202*   Will add 2 units of novolog TIDAC.     Acute kidney injury probably secondary to DKA and lactic acidosis. Improved with  hydration.   Mild normocytic anemia Hemoglobin stable around 11.  Hypertension Well-controlled   Hyperlipidemia Continue with statin   DVT prophylaxis: Lovenox Code Status: Full code Family Communication: None at bedside, discussed the plan with the patient Disposition Plan: Pending improvement possible discharge in the next 1 to 2 days if we can wean her off the oxygen.  Consultants:   PCCM   Procedures: None  Antimicrobials: Rocephin and Zithromax  Subjective: RIGHT SIDED chest pain improving. No nausea, vomiting.   Objective: Vitals:   03/24/19 0254 03/24/19 0803 03/24/19 0900 03/24/19 1059  BP:   114/77   Pulse: 84 72 81 82  Resp: (!) 24 (!) 22  (!) 21  Temp:      TempSrc:      SpO2: 100% 100% 99% 99%  Weight:      Height:       No intake or output data in the 24 hours ending 03/24/19 1315 Filed Weights   03/22/19 0900  Weight: 115.3 kg    Examination:  General exam: Appears calm and comfortable s/p trach on 5 lit of oxygen.  Respiratory system: Clear to auscultation. Respiratory effort normal.no wheezing heard.  Cardiovascular system: S1 & S2 heard, RRR. Gastrointestinal system: Abdomen is nondistended, soft and nontender. No organomegaly or masses felt. Normal bowel sounds heard. Central nervous system: Alert and oriented. No focal neurological deficits. Extremities: Symmetric 5 x 5 power. Skin: No rashes, lesions or ulcers Psychiatry:  Mood & affect appropriate.     Data Reviewed: I have personally reviewed following labs and imaging studies  CBC: Recent Labs  Lab 03/21/19 2035 03/22/19 0319 03/23/19 0757 03/24/19 0453  WBC 8.8  --  15.6* 12.7*  HGB 11.9* 11.9* 11.5* 11.7*  HCT 37.2 35.0* 36.0 36.1  MCV 87.1  --  88.5 88.0  PLT 310  --  303 220   Basic Metabolic Panel: Recent Labs  Lab 03/22/19 2213 03/23/19 0202 03/23/19 0757 03/23/19 1203 03/24/19 0453  NA 138 135 138 138 140  K 4.3 4.1 3.9 3.7 3.7  CL 110 108 107 108 108   CO2 19* 20* 20* 21* 22  GLUCOSE 202* 229* 160* 116* 212*  BUN 17 18 16 16 19   CREATININE 1.12* 1.05* 1.01* 0.99 1.08*  CALCIUM 8.6* 8.5* 8.6* 8.6* 8.6*   GFR: Estimated Creatinine Clearance: 74.4 mL/min (A) (by C-G formula based on SCr of 1.08 mg/dL (H)). Liver Function Tests: Recent Labs  Lab 03/22/19 1029 03/22/19 1415  AST 30 26  ALT 31 29  ALKPHOS 107 99  BILITOT 0.5 0.3  PROT 7.9 7.2  ALBUMIN 3.7 3.4*   No results for input(s): LIPASE, AMYLASE in the last 168 hours. No results for input(s): AMMONIA in the last 168 hours. Coagulation Profile: No results for input(s): INR, PROTIME in the last 168 hours. Cardiac Enzymes: Recent Labs  Lab 03/22/19 1415  CKTOTAL 85   BNP (last 3 results) No results for input(s): PROBNP in the last 8760 hours. HbA1C: Recent Labs    03/22/19 1415  HGBA1C 13.7*   CBG: Recent Labs  Lab 03/23/19 1223 03/23/19 1611 03/23/19 2055 03/24/19 0745 03/24/19 1220  GLUCAP 111* 201* 347* 135* 202*   Lipid Profile: No results for input(s): CHOL, HDL, LDLCALC, TRIG, CHOLHDL, LDLDIRECT in the last 72 hours. Thyroid Function Tests: No results for input(s): TSH, T4TOTAL, FREET4, T3FREE, THYROIDAB in the last 72 hours. Anemia Panel: No results for input(s): VITAMINB12, FOLATE, FERRITIN, TIBC, IRON, RETICCTPCT in the last 72 hours. Sepsis Labs: Recent Labs  Lab 03/22/19 1415 03/22/19 1908 03/23/19 1203 03/23/19 1541  LATICACIDVEN 6.5* 3.2* 1.9 1.6    Recent Results (from the past 240 hour(s))  SARS Coronavirus 2 (CEPHEID- Performed in [facility] hospital lab), Hosp Order     Status: None   Collection Time: 03/22/19  1:29 AM   Specimen: Nasopharyngeal Swab  Result Value Ref Range Status   SARS Coronavirus 2 NEGATIVE NEGATIVE Final    Comment: (NOTE) If result is NEGATIVE SARS-CoV-2 target nucleic acids are NOT DETECTED. The SARS-CoV-2 RNA is generally detectable in upper and lower  respiratory specimens during the acute phase of  infection. The lowest  concentration of SARS-CoV-2 viral copies this assay can detect is 250  copies / mL. A negative result does not preclude SARS-CoV-2 infection  and should not be used as the sole basis for treatment or other  patient management decisions.  A negative result may occur with  improper specimen collection / handling, submission of specimen other  than nasopharyngeal swab, presence of viral mutation(s) within the  areas targeted by this assay, and inadequate number of viral copies  (<250 copies / mL). A negative result must be combined with clinical  observations, patient history, and epidemiological information. If result is POSITIVE SARS-CoV-2 target nucleic acids are DETECTED. The SARS-CoV-2 RNA is generally detectable in upper and lower  respiratory specimens dur ing the acute phase of infection.  Positive  results are indicative of active infection with SARS-CoV-2.  Clinical  correlation with patient history and other diagnostic information is  necessary to determine patient infection status.  Positive results do  not rule out bacterial infection or co-infection with other  viruses. If result is PRESUMPTIVE POSTIVE SARS-CoV-2 nucleic acids MAY BE PRESENT.   A presumptive positive result was obtained on the submitted specimen  and confirmed on repeat testing.  While 2019 novel coronavirus  (SARS-CoV-2) nucleic acids may be present in the submitted sample  additional confirmatory testing may be necessary for epidemiological  and / or clinical management purposes  to differentiate between  SARS-CoV-2 and other Sarbecovirus currently known to infect humans.  If clinically indicated additional testing with an alternate test  methodology [phone]) is advised. The SARS-CoV-2 RNA is generally  detectable in upper and lower respiratory sp ecimens during the acute  phase of infection. The expected result is Negative. Fact Sheet for Patients:   StrictlyIdeas.no Fact Sheet for Healthcare Providers: BankingDealers.co.za This test is not yet approved or cleared by the [place] FDA and has been authorized for detection and/or diagnosis of SARS-CoV-2 by FDA under an Emergency Use Authorization (EUA).  This EUA will remain in effect (meaning this test can be used) for the duration of the COVID-19 declaration under Section 564(b)(1) of the Act, 21 U.S.C. section 360bbb-3(b)(1), unless the authorization is terminated or revoked sooner. Performed at [facility], [place] [address] [place], [place] [address]   Blood culture (routine x 2)     Status: None (Preliminary result)   Collection Time: 03/22/19  2:10 AM   Specimen: BLOOD  Result Value Ref Range Status   Specimen Description BLOOD RIGHT ANTECUBITAL  Final   Special Requests   Final    BOTTLES DRAWN AEROBIC AND ANAEROBIC Blood Culture results may not be optimal due to an inadequate volume of blood received in culture bottles   Culture   Final    NO GROWTH 2 DAYS Performed at [facility], [place] [address], [place], [place] [address]    Report Status PENDING  Incomplete  Blood culture (routine x 2)     Status: None (Preliminary result)   Collection Time: 03/22/19  3:31 AM   Specimen: BLOOD RIGHT WRIST  Result Value Ref Range Status   Specimen Description BLOOD RIGHT WRIST  Final   Special Requests   Final    BOTTLES DRAWN AEROBIC ONLY Blood Culture adequate volume   Culture   Final    NO GROWTH 2 DAYS Performed at [facility], [place] [address], [place], [place] [address]    Report Status PENDING  Incomplete         Radiology Studies: No results found.      Scheduled Meds: . amLODipine  10 mg Oral Daily  . atorvastatin  20 mg Oral Daily  . azithromycin  500 mg Oral Daily  . buPROPion  150 mg Oral BID  . carvedilol  25 mg Oral BID WC  . enoxaparin (LOVENOX) injection  40 mg Subcutaneous Q24H  .  gabapentin  600 mg Oral BID  . insulin aspart  0-5 Units Subcutaneous QHS  . insulin aspart  0-9 Units Subcutaneous TID WC  . insulin glargine  130 Units Subcutaneous Daily  . ipratropium-albuterol  3 mL Nebulization TID  . mometasone-formoterol  2 puff Inhalation BID  . montelukast  10 mg Oral QHS  . predniSONE  40 mg Oral Q breakfast  . sertraline  100 mg Oral QHS  . sodium chloride flush  3 mL Intravenous Once   Continuous Infusions: . cefTRIAXone (ROCEPHIN)  IV 2 g (03/24/19 1238)     LOS: 1 day    Time spent: 46  minutes    Hosie Poisson, MD [facility] Pager [phone]  If 7PM-7AM, please contact night-coverage [email or web address] Password [email or web address] 03/24/2019, 1:15 PM

## 2019-03-24 NOTE — Progress Notes (Signed)
Inpatient Diabetes Program Recommendations  AACE/ADA: New Consensus Statement on Inpatient Glycemic Control (2015)  Target Ranges:  Prepandial:   less than 140 mg/dL      Peak postprandial:   less than 180 mg/dL (1-2 hours)      Critically ill patients:  140 - 180 mg/dL   Results for [name], Brittney MAE "Raymie [name]" (MRN [id number]) as of 03/24/2019 10:26  Ref. Range 03/23/2019 11:17 03/23/2019 12:23 03/23/2019 16:11 03/23/2019 20:55 03/24/2019 07:45  Glucose-Capillary Latest Ref Range: 70 - 99 mg/dL 124 (H) 111 (H) 201 (H) 347 (H) 135 (H)   Review of Glycemic Control  Diabetes history: Type 2 DM Outpatient Diabetes medications: Tresiba 130 units QD  Current orders for Inpatient glycemic control:  Lantus 130 Daily, Novolog 0-9 units tid, Novolog 0-5 units qhs  Inpatient Diabetes Program Recommendations:     Patient now on PO prednisone 40 mg Daily. Glucose trends increase after meals.    -  Consider increasing Novolog Correction to 0-15 units    -  Add Novolog 3 units tid meal coverage if patient consumes at least 50% of meals.   Thanks, Tama Headings RN, MSN, BC-ADM Inpatient Diabetes Coordinator Team Pager [phone] (8a-5p)

## 2019-03-25 ENCOUNTER — Inpatient Hospital Stay (HOSPITAL_COMMUNITY): Payer: Medicare HMO

## 2019-03-25 ENCOUNTER — Encounter (HOSPITAL_COMMUNITY): Payer: Self-pay

## 2019-03-25 DIAGNOSIS — Z930 Tracheostomy status: Secondary | ICD-10-CM | POA: Diagnosis not present

## 2019-03-25 DIAGNOSIS — R0902 Hypoxemia: Secondary | ICD-10-CM | POA: Diagnosis not present

## 2019-03-25 DIAGNOSIS — R0789 Other chest pain: Secondary | ICD-10-CM | POA: Diagnosis not present

## 2019-03-25 DIAGNOSIS — R079 Chest pain, unspecified: Secondary | ICD-10-CM | POA: Diagnosis not present

## 2019-03-25 DIAGNOSIS — I10 Essential (primary) hypertension: Secondary | ICD-10-CM | POA: Diagnosis not present

## 2019-03-25 DIAGNOSIS — N179 Acute kidney failure, unspecified: Secondary | ICD-10-CM | POA: Diagnosis not present

## 2019-03-25 DIAGNOSIS — J45901 Unspecified asthma with (acute) exacerbation: Secondary | ICD-10-CM | POA: Diagnosis not present

## 2019-03-25 LAB — GLUCOSE, CAPILLARY
Glucose-Capillary: 76 mg/dL (ref 70–99)
Glucose-Capillary: 119 mg/dL — ABNORMAL HIGH (ref 70–99)
Glucose-Capillary: 216 mg/dL — ABNORMAL HIGH (ref 70–99)
Glucose-Capillary: 281 mg/dL — ABNORMAL HIGH (ref 70–99)

## 2019-03-25 MED ORDER — LIDOCAINE 5 % EX PTCH
1.0000 | MEDICATED_PATCH | CUTANEOUS | Status: DC
  Administered 2019-03-25: 1 via TRANSDERMAL
  Filled 2019-03-25: qty 1

## 2019-03-25 MED ORDER — SUMATRIPTAN SUCCINATE 100 MG PO TABS
100.0000 mg | ORAL_TABLET | Freq: Once | ORAL | Status: AC
  Administered 2019-03-25: 100 mg via ORAL
  Filled 2019-03-25: qty 1

## 2019-03-25 MED ORDER — INSULIN ASPART 100 UNIT/ML ~~LOC~~ SOLN
3.0000 [IU] | Freq: Three times a day (TID) | SUBCUTANEOUS | Status: DC
  Administered 2019-03-25: 3 [IU] via SUBCUTANEOUS

## 2019-03-25 MED ORDER — METHOCARBAMOL 1000 MG/10ML IJ SOLN
500.0000 mg | Freq: Four times a day (QID) | INTRAVENOUS | Status: DC | PRN
  Filled 2019-03-25 (×2): qty 5

## 2019-03-25 MED ORDER — MORPHINE SULFATE (PF) 2 MG/ML IV SOLN
2.0000 mg | Freq: Once | INTRAVENOUS | Status: AC
  Administered 2019-03-25: 2 mg via INTRAVENOUS
  Filled 2019-03-25: qty 1

## 2019-03-25 MED ORDER — MORPHINE SULFATE (PF) 2 MG/ML IV SOLN
1.0000 mg | INTRAVENOUS | Status: DC | PRN
  Administered 2019-03-25: 1 mg via INTRAVENOUS
  Filled 2019-03-25: qty 1

## 2019-03-25 MED ORDER — KETOROLAC TROMETHAMINE 15 MG/ML IJ SOLN
15.0000 mg | Freq: Once | INTRAMUSCULAR | Status: AC
  Administered 2019-03-25: 15 mg via INTRAVENOUS
  Filled 2019-03-25: qty 1

## 2019-03-25 MED ORDER — HYDROMORPHONE HCL 1 MG/ML IJ SOLN
1.0000 mg | Freq: Once | INTRAMUSCULAR | Status: AC
  Administered 2019-03-25: 1 mg via INTRAVENOUS
  Filled 2019-03-25: qty 1

## 2019-03-25 NOTE — TOC Transition Note (Signed)
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 NCM offered choice for [facility] , she states she does not want [facility] services.  Plan for patient to dc home today.     Final next level of care: Home/Self Care Barriers to Discharge: No Barriers Identified   Patient Goals and CMS Choice Patient states their goals for this hospitalization and ongoing recovery are:: go home CMS Medicare.gov Compare Post Acute Care list provided to:: Patient Choice offered to / list presented to : Patient  Discharge Placement                       Discharge Plan and Services   Discharge Planning Services: CM Consult Post Acute Care Choice: NA          DME Arranged: (NA)         HH Arranged: Refused HH          Social Determinants of Health (SDOH) Interventions     Readmission Risk Interventions No flowsheet data found.

## 2019-03-25 NOTE — Progress Notes (Addendum)
PT Cancellation Note  Patient Details Name: [name] MRN: [id number] DOB: [birth year]   Cancelled Treatment:    Reason Eval/Treat Not Completed: Pain limiting ability to participate. Pt with c/o severe chest pain. Unable to mobilize. Pt recently transitioned to RA with SpO2 96%. PT to re-attempt as time allows.  1210 Addendum: Re-attempted PT. Pt reports pain is a little better. Continues to decline mobility. She reports no concerns regarding mobility at home.   Lorriane Shire 03/25/2019, 10:54 AM   Lorrin Goodell, PT  Office # [phone] Pager [phone]

## 2019-03-25 NOTE — Progress Notes (Signed)
PROGRESS NOTE    [name]  YTK:[id number] DOB: 1971-09-01 DOA: 03/21/2019 PCP: [name], MD    Brief Narrative:  [name] is a 48 y.o. female with medical history significant of trach dependence due to tracheal stenosis; asthma; HTN; severe OSA; morbid obesity (BMI 48); DM; and chronic pain presenting with SOB/CP, mostly on the right side of the chest.   Pt is currently on 5 lit of oxygen via trach collar.   Patient admitted for acute asthma exacerbation.  Assessment & Plan:   Principal Problem:   Chest pain of uncertain etiology Active Problems:   Sleep apnea, obstructive   Essential hypertension   Type 2 diabetes mellitus with diabetic polyneuropathy, without long-term current use of insulin (HCC)   Morbid obesity (HCC)   Chronic pain syndrome   AKI (acute kidney injury) ([place])   Tracheostomy dependent (HCC)   Acute asthma exacerbation   Lactic acidosis   Right-sided chest pain Intermittent associated with deep breathing. Pain is 8 out of 10. Added dilaudid and robaxin and her pain has improved to 5 - 6/10.  Differential include pleuritis versus musculoskeletal pain. Pain control CTA of the chest is nondiagnostic. Appreciate PCCM input.   Tracheostomy dependence/acute asthma exacerbation Transitioned to RA today.  Wean in the next 24 hours as appropriate. Afebrile, leukocytosis is improving.  Continue with Rocephin and Zithromax for acute purulent bronchitis Continue with bronchodilators and Singulair. As per PCCM maintain for cuffless Shiley.  Diabetes mellitus with hyperglycemia/DKA Briefly on insulin gtt., transition to long-acting subcutaneous insulin. Hemoglobin A1c of 13.7 Anion gap closed Lactic acid normalized. Bicarbonate improved to 21. CBG (last 3)  Recent Labs    03/24/19 2100 03/25/19 0741 03/25/19 1152  GLUCAP 249* 76 119*   Will add 3 units of novolog TIDAC.     Acute kidney injury probably  secondary to DKA and lactic acidosis. Improved with hydration.   Mild normocytic anemia Hemoglobin stable around 11.  Hypertension Well-controlled   Hyperlipidemia Continue with statin   DVT prophylaxis: Lovenox Code Status: Full code Family Communication: None at bedside, discussed the plan with the patient Disposition Plan: Pending improvement possible discharge in the next 1 to 2 days if we can wean her off the oxygen.  Consultants:   PCCM   Procedures: None  Antimicrobials: Rocephin and Zithromax  Subjective: RIGHT SIDED pain in the back and front worse with breathing.  No visible lesionsonthe chest wall or the back .  Tender to palpation.   Objective: Vitals:   03/25/19 0808 03/25/19 0828 03/25/19 1109 03/25/19 1550  BP:  (!) 155/89    Pulse: 100 73 83 71  Resp: 20 19 20 20   Temp:  98.7 F (37.1 C)    TempSrc:  Oral    SpO2: 100% 100% 96% 97%  Weight:      Height:        Intake/Output Summary (Last 24 hours) at 03/25/2019 1647 Last data filed at 03/25/2019 1321 Gross per 24 hour  Intake -  Output 1000 ml  Net -1000 ml   Filed Weights   03/22/19 0900  Weight: 115.3 kg    Examination:  General exam: Appears calm and comfortable s/p trach on 5 lit of oxygen.  Respiratory system: Clear to auscultation. Respiratory effort normal.no wheezing heard.  Cardiovascular system: S1 & S2 heard, RRR. Gastrointestinal system: Abdomen is nondistended, soft and nontender. No organomegaly or masses felt. Normal bowel sounds heard. Central nervous system: Alert and oriented. No focal neurological  deficits. Extremities: Symmetric 5 x 5 power. Skin: No rashes, lesions or ulcers Psychiatry:  Mood & affect appropriate.     Data Reviewed: I have personally reviewed following labs and imaging studies  CBC: Recent Labs  Lab 03/21/19 2035 03/22/19 0319 03/23/19 0757 03/24/19 0453  WBC 8.8  --  15.6* 12.7*  HGB 11.9* 11.9* 11.5* 11.7*  HCT 37.2 35.0* 36.0 36.1   MCV 87.1  --  88.5 88.0  PLT 310  --  303 076   Basic Metabolic Panel: Recent Labs  Lab 03/22/19 2213 03/23/19 0202 03/23/19 0757 03/23/19 1203 03/24/19 0453  NA 138 135 138 138 140  K 4.3 4.1 3.9 3.7 3.7  CL 110 108 107 108 108  CO2 19* 20* 20* 21* 22  GLUCOSE 202* 229* 160* 116* 212*  BUN 17 18 16 16 19   CREATININE 1.12* 1.05* 1.01* 0.99 1.08*  CALCIUM 8.6* 8.5* 8.6* 8.6* 8.6*   GFR: Estimated Creatinine Clearance: 74.4 mL/min (A) (by C-G formula based on SCr of 1.08 mg/dL (H)). Liver Function Tests: Recent Labs  Lab 03/22/19 1029 03/22/19 1415  AST 30 26  ALT 31 29  ALKPHOS 107 99  BILITOT 0.5 0.3  PROT 7.9 7.2  ALBUMIN 3.7 3.4*   No results for input(s): LIPASE, AMYLASE in the last 168 hours. No results for input(s): AMMONIA in the last 168 hours. Coagulation Profile: No results for input(s): INR, PROTIME in the last 168 hours. Cardiac Enzymes: Recent Labs  Lab 03/22/19 1415  CKTOTAL 85   BNP (last 3 results) No results for input(s): PROBNP in the last 8760 hours. HbA1C: No results for input(s): HGBA1C in the last 72 hours. CBG: Recent Labs  Lab 03/24/19 1220 03/24/19 1729 03/24/19 2100 03/25/19 0741 03/25/19 1152  GLUCAP 202* 323* 249* 76 119*   Lipid Profile: No results for input(s): CHOL, HDL, LDLCALC, TRIG, CHOLHDL, LDLDIRECT in the last 72 hours. Thyroid Function Tests: No results for input(s): TSH, T4TOTAL, FREET4, T3FREE, THYROIDAB in the last 72 hours. Anemia Panel: No results for input(s): VITAMINB12, FOLATE, FERRITIN, TIBC, IRON, RETICCTPCT in the last 72 hours. Sepsis Labs: Recent Labs  Lab 03/22/19 1415 03/22/19 1908 03/23/19 1203 03/23/19 1541  LATICACIDVEN 6.5* 3.2* 1.9 1.6    Recent Results (from the past 240 hour(s))  SARS Coronavirus 2 (CEPHEID- Performed in [facility] hospital lab), Hosp Order     Status: None   Collection Time: 03/22/19  1:29 AM   Specimen: Nasopharyngeal Swab  Result Value Ref Range Status   SARS  Coronavirus 2 NEGATIVE NEGATIVE Final    Comment: (NOTE) If result is NEGATIVE SARS-CoV-2 target nucleic acids are NOT DETECTED. The SARS-CoV-2 RNA is generally detectable in upper and lower  respiratory specimens during the acute phase of infection. The lowest  concentration of SARS-CoV-2 viral copies this assay can detect is 250  copies / mL. A negative result does not preclude SARS-CoV-2 infection  and should not be used as the sole basis for treatment or other  patient management decisions.  A negative result may occur with  improper specimen collection / handling, submission of specimen other  than nasopharyngeal swab, presence of viral mutation(s) within the  areas targeted by this assay, and inadequate number of viral copies  (<250 copies / mL). A negative result must be combined with clinical  observations, patient history, and epidemiological information. If result is POSITIVE SARS-CoV-2 target nucleic acids are DETECTED. The SARS-CoV-2 RNA is generally detectable in upper and lower  respiratory  specimens dur ing the acute phase of infection.  Positive  results are indicative of active infection with SARS-CoV-2.  Clinical  correlation with patient history and other diagnostic information is  necessary to determine patient infection status.  Positive results do  not rule out bacterial infection or co-infection with other viruses. If result is PRESUMPTIVE POSTIVE SARS-CoV-2 nucleic acids MAY BE PRESENT.   A presumptive positive result was obtained on the submitted specimen  and confirmed on repeat testing.  While 2019 novel coronavirus  (SARS-CoV-2) nucleic acids may be present in the submitted sample  additional confirmatory testing may be necessary for epidemiological  and / or clinical management purposes  to differentiate between  SARS-CoV-2 and other Sarbecovirus currently known to infect humans.  If clinically indicated additional testing with an alternate test   methodology [phone]) is advised. The SARS-CoV-2 RNA is generally  detectable in upper and lower respiratory sp ecimens during the acute  phase of infection. The expected result is Negative. Fact Sheet for Patients:  StrictlyIdeas.no Fact Sheet for Healthcare Providers: BankingDealers.co.za This test is not yet approved or cleared by the [place] FDA and has been authorized for detection and/or diagnosis of SARS-CoV-2 by FDA under an Emergency Use Authorization (EUA).  This EUA will remain in effect (meaning this test can be used) for the duration of the COVID-19 declaration under Section 564(b)(1) of the Act, 21 U.S.C. section 360bbb-3(b)(1), unless the authorization is terminated or revoked sooner. Performed at [facility], [place] [address] [place], [place] [address]   Blood culture (routine x 2)     Status: None (Preliminary result)   Collection Time: 03/22/19  2:10 AM   Specimen: BLOOD  Result Value Ref Range Status   Specimen Description BLOOD RIGHT ANTECUBITAL  Final   Special Requests   Final    BOTTLES DRAWN AEROBIC AND ANAEROBIC Blood Culture results may not be optimal due to an inadequate volume of blood received in culture bottles   Culture   Final    NO GROWTH 3 DAYS Performed at [facility], [place] [address], [place], [place] [address]    Report Status PENDING  Incomplete  Blood culture (routine x 2)     Status: None (Preliminary result)   Collection Time: 03/22/19  3:31 AM   Specimen: BLOOD RIGHT WRIST  Result Value Ref Range Status   Specimen Description BLOOD RIGHT WRIST  Final   Special Requests   Final    BOTTLES DRAWN AEROBIC ONLY Blood Culture adequate volume   Culture   Final    NO GROWTH 3 DAYS Performed at [facility], [place] [address] [place], [place] [address]    Report Status PENDING  Incomplete         Radiology Studies: No results found.      Scheduled Meds: .  amLODipine  10 mg Oral Daily  . atorvastatin  20 mg Oral Daily  . azithromycin  500 mg Oral Daily  . buPROPion  150 mg Oral BID  . carvedilol  25 mg Oral BID WC  . enoxaparin (LOVENOX) injection  40 mg Subcutaneous Q24H  . gabapentin  600 mg Oral BID  . insulin aspart  0-5 Units Subcutaneous QHS  . insulin aspart  0-9 Units Subcutaneous TID WC  . insulin glargine  130 Units Subcutaneous Daily  . ipratropium-albuterol  3 mL Nebulization TID  . mometasone-formoterol  2 puff Inhalation BID  . montelukast  10 mg Oral QHS  .  predniSONE  40 mg Oral Q breakfast  . sertraline  100 mg Oral QHS  . sodium chloride flush  3 mL Intravenous Once   Continuous Infusions: . cefTRIAXone (ROCEPHIN)  IV 2 g (03/25/19 1321)  . methocarbamol (ROBAXIN) IV       LOS: 2 days    Time spent: 36 minutes    Hosie Poisson, MD [facility] Pager [phone]  If 7PM-7AM, please contact night-coverage [email or web address] Password [email or web address] 03/25/2019, 4:46 PM

## 2019-03-25 NOTE — Progress Notes (Signed)
Inpatient Diabetes Program Recommendations  AACE/ADA: New Consensus Statement on Inpatient Glycemic Control (2015)  Target Ranges:  Prepandial:   less than 140 mg/dL      Peak postprandial:   less than 180 mg/dL (1-2 hours)      Critically ill patients:  140 - 180 mg/dL   Results for [name], Brittney MAE "[name]" (MRN [id number]) as of 03/25/2019 09:31  Ref. Range 03/24/2019 07:45 03/24/2019 12:20 03/24/2019 17:29 03/24/2019 21:00  Glucose-Capillary Latest Ref Range: 70 - 99 mg/dL 135 (H)  1 units NOVOLOG +  130 units LANTUS given at 9am  202 (H)  3 units NOVOLOG  323 (H)  7 units NOVOLOG  249 (H)  2 units NOVOLOG    Results for [name], Brittney MAE "[name]" (MRN [id number]) as of 03/25/2019 09:31  Ref. Range 03/25/2019 07:41  Glucose-Capillary Latest Ref Range: 70 - 99 mg/dL 76     Home DM Meds: Tresiba 130 units daily  Current Orders: Lantus 130 units Daily      Novolog Sensitive Correction Scale/ SSI (0-9 units) TID AC + HS        MD- Note patient getting Prednisone 40 mg Daily.  Post-meal CBGs elevated likely due to the effects of the Prednisone.  Please consider adding Novolog Meal Coverage to inpatient insulin regimen:  Novolog 4 units TID with meals  (Please add the following Hold Parameters: Hold if pt eats <50% of meal, Hold if pt NPO)     --Will follow patient during hospitalization--  Wyn Quaker RN, MSN, CDE Diabetes Coordinator Inpatient Glycemic Control Team Team Pager: [phone] (8a-5p)

## 2019-03-25 NOTE — Care Management Important Message (Signed)
Important Message  Patient Details  Name: [name] MRN: [id number] Date of Birth: 10-27-1970   Medicare Important Message Given:  Yes     Nazaret Chea 03/25/2019, 2:14 PM

## 2019-03-26 DIAGNOSIS — R0789 Other chest pain: Secondary | ICD-10-CM | POA: Diagnosis not present

## 2019-03-26 DIAGNOSIS — I10 Essential (primary) hypertension: Secondary | ICD-10-CM | POA: Diagnosis not present

## 2019-03-26 DIAGNOSIS — R0902 Hypoxemia: Secondary | ICD-10-CM | POA: Diagnosis not present

## 2019-03-26 DIAGNOSIS — N179 Acute kidney failure, unspecified: Secondary | ICD-10-CM | POA: Diagnosis not present

## 2019-03-26 DIAGNOSIS — Z930 Tracheostomy status: Secondary | ICD-10-CM | POA: Diagnosis not present

## 2019-03-26 DIAGNOSIS — J45901 Unspecified asthma with (acute) exacerbation: Secondary | ICD-10-CM | POA: Diagnosis not present

## 2019-03-26 LAB — GLUCOSE, CAPILLARY
Glucose-Capillary: 71 mg/dL (ref 70–99)
Glucose-Capillary: 141 mg/dL — ABNORMAL HIGH (ref 70–99)

## 2019-03-26 MED ORDER — HYDROMORPHONE HCL 1 MG/ML IJ SOLN
1.0000 mg | Freq: Once | INTRAMUSCULAR | Status: AC
  Administered 2019-03-26: 1 mg via INTRAVENOUS
  Filled 2019-03-26: qty 1

## 2019-03-26 MED ORDER — IBUPROFEN 600 MG PO TABS
600.0000 mg | ORAL_TABLET | Freq: Four times a day (QID) | ORAL | Status: DC | PRN
  Administered 2019-03-26: 600 mg via ORAL
  Filled 2019-03-26: qty 1

## 2019-03-26 MED ORDER — METHOCARBAMOL 500 MG PO TABS: 500.0000 mg | ORAL_TABLET | Freq: Three times a day (TID) | ORAL | 0 refills | Status: DC | PRN

## 2019-03-26 MED ORDER — PANTOPRAZOLE SODIUM 40 MG PO TBEC
40.0000 mg | DELAYED_RELEASE_TABLET | Freq: Once | ORAL | Status: AC
  Administered 2019-03-26: 40 mg via ORAL
  Filled 2019-03-26: qty 1

## 2019-03-26 MED ORDER — OXYCODONE HCL 5 MG PO TABS: 5.0000 mg | ORAL_TABLET | Freq: Four times a day (QID) | ORAL | 0 refills | Status: AC | PRN

## 2019-03-26 MED ORDER — AZITHROMYCIN 500 MG PO TABS: 500.0000 mg | ORAL_TABLET | Freq: Every day | ORAL | 0 refills | Status: AC

## 2019-03-26 NOTE — Progress Notes (Signed)
Pt refusing long acting insulin.  Pt educated about glucose control and difference in short and long acting insulin.   Pt also refusing Lovenox at this time stating she is leaving today. Will let MD know.

## 2019-03-27 LAB — CULTURE, BLOOD (ROUTINE X 2)
Culture: NO GROWTH
Culture: NO GROWTH
Special Requests: ADEQUATE

## 2019-03-27 NOTE — Discharge Summary (Signed)
Physician Discharge Summary  [name] KCL:[id number] DOB: 28-Jun-1971 DOA: 03/21/2019  PCP: [name], MD  Admit date: 03/21/2019 Discharge date: 03/26/2019  Admitted From: Home.  Disposition:  Home.   Recommendations for Outpatient Follow-up:  1. Follow up with PCP in 1-2 weeks 2. Please obtain BMP/CBC in one week   Discharge Condition: guarded.  CODE STATUS: full code.  Diet recommendation: Heart Healthy   Brief/Interim Summary: Brittney Mae Hutchinson-Matthewsis a 48 y.o.femalewith medical history significant oftrach dependencedue to tracheal stenosis; asthma; HTN;severe OSA; morbid obesity (BMI 48);DM; and chronic pain presenting with SOB/CP, mostly on the right side of the chest.   Pt is currently on 5 lit of oxygen via trach collar.   Patient admitted for acute asthma exacerbation.  Discharge Diagnoses:  Principal Problem:   Chest pain of uncertain etiology Active Problems:   Sleep apnea, obstructive   Essential hypertension   Type 2 diabetes mellitus with diabetic polyneuropathy, without long-term current use of insulin (HCC)   Morbid obesity (HCC)   Chronic pain syndrome   AKI (acute kidney injury) ([place])   Tracheostomy dependent (HCC)   Acute asthma exacerbation   Lactic acidosis  Right-sided chest pain Intermittent associated with deep breathing and tenderness on palpation  Differential include pleuritis versus musculoskeletal pain vs costochondritis.  Pain control CTA of the chest is nondiagnostic. Appreciate PCCM input.   Tracheostomy dependence/acute asthma exacerbation Transitioned to RA today.  Afebrile, leukocytosis is improving.  Transition to oral antibiotics on discharge.  Continue with bronchodilators and Singulair. As per PCCM maintain for cuffless Sh[place].  Diabetes mellitus with hyperglycemia/DKA Briefly on insulin gtt., transition to long-acting subcutaneous insulin. Hemoglobin A1c of 13.7 Anion gap  closed Lactic acid normalized. Bicarbonate improved to 21. CBG (last 3)  Recent Labs    03/25/19 2015 03/26/19 0830 03/26/19 1134  GLUCAP 281* 71 141*        Acute kidney injury probably secondary to DKA and lactic acidosis. Improved with hydration.   Mild normocytic anemia Hemoglobin stable around 11.  Hypertension Well-controlled   Hyperlipidemia Continue with statin     Discharge Instructions  Discharge Instructions    Diet - low sodium heart healthy   Complete by: As directed    Discharge instructions   Complete by: As directed    Please follow up with PCP in one week.     Allergies as of 03/26/2019      Reactions   Reglan [metoclopramide] Other (See Comments)   Panic attack      Medication List    STOP taking these medications   naproxen sodium 220 MG tablet Commonly known as: ALEVE   traMADol 50 MG tablet Commonly known as: ULTRAM     TAKE these medications   Accu-Chek Aviva Plus w/Device Kit Used to check blood sugars once daily. E11.9   Accu-Chek Softclix Lancets lancets Use as instructed to check blood sugar once daily. E11.9   albuterol (2.5 MG/3ML) 0.083% nebulizer solution Commonly known as: PROVENTIL Take 3 mLs (2.5 mg total) by nebulization every 6 (six) hours as needed for wheezing or shortness of breath.   albuterol 108 (90 Base) MCG/ACT inhaler Commonly known as: ProAir HFA Inhale 2 puffs into the lungs every 6 (six) hours as needed for wheezing or shortness of breath.   amLODipine 10 MG tablet Commonly known as: NORVASC Take 1 tablet (10 mg total) by mouth daily.   aspirin-acetaminophen-caffeine 250-250-65 MG tablet Commonly known as: EXCEDRIN MIGRAINE Take 2 tablets by mouth every 6 (  six) hours as needed for headache.   atorvastatin 20 MG tablet Commonly known as: LIPITOR Take 1 tablet (20 mg total) by mouth daily.   azithromycin 500 MG tablet Commonly known as: Zithromax Take 1 tablet (500 mg total) by  mouth daily for 3 days. Take 1 tablet daily for 3 days.   budesonide-formoterol 160-4.5 MCG/ACT inhaler Commonly known as: SYMBICORT Inhale 2 puffs into the lungs 2 (two) times daily.   buPROPion 150 MG 12 hr tablet Commonly known as: Wellbutrin SR Take 1 tablet (150 mg total) by mouth 2 (two) times daily.   carvedilol 25 MG tablet Commonly known as: COREG Take 1 tablet (25 mg total) by mouth 2 (two) times daily with a meal.   Fifty50 Pen Needles 31G X 8 MM Misc Generic drug: Insulin Pen Needle Inject 1 Units as directed daily.   furosemide 20 MG tablet Commonly known as: LASIX Take 1 tablet (20 mg total) by mouth daily.   gabapentin 300 MG capsule Commonly known as: NEURONTIN Take 2 capsules (600 mg total) by mouth 2 (two) times daily.   glucose blood test strip Commonly known as: Accu-Chek Aviva Use as directed to check blood sugars once daily. E11.9   hydrOXYzine 25 MG tablet Commonly known as: ATARAX/VISTARIL Take 1 tablet (25 mg total) by mouth 3 (three) times daily as needed for anxiety.   ibuprofen 600 MG tablet Commonly known as: ADVIL Take 1 tablet (600 mg total) by mouth every 8 (eight) hours as needed. What changed: reasons to take this   Insulin Degludec 200 UNIT/ML Sopn Commonly known as: [place] FlexTouch Inject 130 Units into the skin daily. Take an extra 10 units while on Prednisone for the next 4days What changed: additional instructions   lisinopril 5 MG tablet Commonly known as: ZESTRIL Take 1 tablet (5 mg total) by mouth daily.   Melatonin 3 MG Tabs Take 3 mg by mouth at bedtime as needed (for sleep).   methocarbamol 500 MG tablet Commonly known as: Robaxin Take 1 tablet (500 mg total) by mouth every 8 (eight) hours as needed for muscle spasms.   montelukast 10 MG tablet Commonly known as: SINGULAIR Take 1 tablet (10 mg total) by mouth at bedtime.   oxyCODONE 5 MG immediate release tablet Commonly known as: Roxicodone Take 1 tablet (5  mg total) by mouth every 6 (six) hours as needed for up to 3 days for severe pain.   sertraline 100 MG tablet Commonly known as: ZOLOFT Take 1 tablet (100 mg total) by mouth at bedtime.   SUMAtriptan 100 MG tablet Commonly known as: IMITREX At the onset of a migraine; may repeat in 2 hours. Max daily dose 216m What changed:   how much to take  how to take this  when to take this  reasons to take this  additional instructions   tiZANidine 4 MG tablet Commonly known as: ZANAFLEX Take 1 tablet (4 mg total) by mouth every 8 (eight) hours as needed for muscle spasms.      Follow-up Information    NCharlott Rakes MD. Schedule an appointment as soon as possible for a visit in 1 week(s).   Specialty: Family Medicine Contact information: [address] [place] [phone]         Allergies  Allergen Reactions  . Reglan [Metoclopramide] Other (See Comments)    Panic attack    Consultations:  None.    Procedures/Studies: Dg Chest 2 View  Result Date: 03/21/2019 CLINICAL  DATA:  Chest pain EXAM: CHEST - 2 VIEW COMPARISON:  None. FINDINGS: Cardiomegaly. Tracheostomy. Both lungs are clear. The visualized skeletal structures are unremarkable. IMPRESSION: Cardiomegaly without acute abnormality of the lungs.  Tracheostomy. Electronically Signed   By: Eddie Candle M.D.   On: 03/21/2019 21:41   Ct Angio Chest Pe W And/or Wo Contrast  Result Date: 03/22/2019 CLINICAL DATA:  Shortness of breath and chest pain with positive D-dimer EXAM: CT ANGIOGRAPHY CHEST WITH CONTRAST TECHNIQUE: Multidetector CT imaging of the chest was performed using the standard protocol during bolus administration of intravenous contrast. Multiplanar CT image reconstructions and MIPs were obtained to evaluate the vascular anatomy. CONTRAST:  81m OMNIPAQUE IOHEXOL 350 MG/ML SOLN COMPARISON:  10/23/2018 FINDINGS: Cardiovascular: Cardiomegaly. No pericardial effusion. Suboptimal  pulmonary artery visualization due to bolus diffusion and soft tissue attenuation-likely best obtainable. There is no evidence of pulmonary embolism. Negative aorta. Mediastinum/Nodes: Chronically enlarged right upper paratracheal lymph node measuring 2 cm short axis. Lungs/Pleura: Tracheostomy tube in place. Central airways are clear. There is minor dependent atelectasis. There is no edema, consolidation, effusion, or pneumothorax. Upper Abdomen: Prominent hepatic steatosis. Musculoskeletal: No acute or aggressive finding Review of the MIP images confirms the above findings. IMPRESSION: 1. No evidence of pulmonary embolism or other acute finding. 2. Cardiomegaly and hepatic steatosis. Electronically Signed   By: JMonte FantasiaM.D.   On: 03/22/2019 04:53   Dg Chest Port 1 View  Result Date: 03/25/2019 CLINICAL DATA:  Follow-up. Patient presents with right-sided chest pain. EXAM: PORTABLE CHEST 1 VIEW COMPARISON:  Radiograph 03/21/2019, CT 03/22/2019 FINDINGS: Tracheostomy tube tip at the thoracic inlet. Lower lung volumes from prior exam. Unchanged cardiomegaly and mediastinal contours allowing for lower lung volumes. No acute airspace disease, large pleural effusion or pneumothorax. No acute osseous abnormalities. IMPRESSION: Lower lung volumes from prior exam, no other interval change. Tracheostomy tube tip remains at the thoracic inlet. Electronically Signed   By: MKeith RakeM.D.   On: 03/25/2019 19:10       Subjective: No new complaints.   Discharge Exam: Vitals:   03/26/19 1434 03/26/19 1500  BP:    Pulse:  87  Resp:  18  Temp:    SpO2: 98% 97%   Vitals:   03/26/19 0826 03/26/19 1100 03/26/19 1434 03/26/19 1500  BP:      Pulse:  96  87  Resp:  18  18  Temp:      TempSrc:      SpO2: 95% 97% 98% 97%  Weight:      Height:        General: Pt is alert, awake, not in acute distress Cardiovascular: RRR, S1/S2 +, no rubs, no gallops Respiratory: CTA bilaterally, no wheezing,  no rhonchi Abdominal: Soft, NT, ND, bowel sounds + Extremities: no edema, no cyanosis    The results of significant diagnostics from this hospitalization (including imaging, microbiology, ancillary and laboratory) are listed below for reference.     Microbiology: Recent Results (from the past 240 hour(s))  SARS Coronavirus 2 ([facility]PHEID- [facility]), Hosp Order     Status: None   Collection Time: 03/22/19  1:29 AM   Specimen: Nasopharyngeal Swab  Result Value Ref Range Status   SARS Coronavirus 2 NEGATIVE NEGATIVE Final    Comment: (NOTE) If result is NEGATIVE SARS-CoV-2 target nucleic acids are NOT DETECTED. The SARS-CoV-2 RNA is generally detectable in upper and lower  respiratory specimens during the acute phase of infection. The lowest  concentration of SARS-CoV-2 viral copies this assay can detect is 250  copies / mL. A negative result does not preclude SARS-CoV-2 infection  and should not be used as the sole basis for treatment or other  patient management decisions.  A negative result may occur with  improper specimen collection / handling, submission of specimen other  than nasopharyngeal swab, presence of viral mutation(s) within the  areas targeted by this assay, and inadequate number of viral copies  (<250 copies / mL). A negative result must be combined with clinical  observations, patient history, and epidemiological information. If result is POSITIVE SARS-CoV-2 target nucleic acids are DETECTED. The SARS-CoV-2 RNA is generally detectable in upper and lower  respiratory specimens dur ing the acute phase of infection.  Positive  results are indicative of active infection with SARS-CoV-2.  Clinical  correlation with patient history and other diagnostic information is  necessary to determine patient infection status.  Positive results do  not rule out bacterial infection or co-infection with other viruses. If result is PRESUMPTIVE  POSTIVE SARS-CoV-2 nucleic acids MAY BE PRESENT.   A presumptive positive result was obtained on the submitted specimen  and confirmed on repeat testing.  While 2019 novel coronavirus  (SARS-CoV-2) nucleic acids may be present in the submitted sample  additional confirmatory testing may be necessary for epidemiological  and / or clinical management purposes  to differentiate between  SARS-CoV-2 and other Sarbecovirus currently known to infect humans.  If clinically indicated additional testing with an alternate test  methodology [phone]) is advised. The SARS-CoV-2 RNA is generally  detectable in upper and lower respiratory sp ecimens during the acute  phase of infection. The expected result is Negative. Fact Sheet for Patients:  StrictlyIdeas.no Fact Sheet for Healthcare Providers: BankingDealers.co.za This test is not yet approved or cleared by the [place] FDA and has been authorized for detection and/or diagnosis of SARS-CoV-2 by FDA under an Emergency Use Authorization (EUA).  This EUA will remain in effect (meaning this test can be used) for the duration of the COVID-19 declaration under Section 564(b)(1) of the Act, 21 U.S.C. section 360bbb-3(b)(1), unless the authorization is terminated or revoked sooner. Performed at [facility], [place] [address] [place], [place] [address]   Blood culture (routine x 2)     Status: None   Collection Time: 03/22/19  2:10 AM   Specimen: BLOOD  Result Value Ref Range Status   Specimen Description BLOOD RIGHT ANTECUBITAL  Final   Special Requests   Final    BOTTLES DRAWN AEROBIC AND ANAEROBIC Blood Culture results may not be optimal due to an inadequate volume of blood received in culture bottles   Culture   Final    NO GROWTH 5 DAYS Performed at [facility], [place] [address] [place], [place] [address]    Report Status 03/27/2019 FINAL  Final  Blood culture (routine x 2)      Status: None   Collection Time: 03/22/19  3:31 AM   Specimen: BLOOD RIGHT WRIST  Result Value Ref Range Status   Specimen Description BLOOD RIGHT WRIST  Final   Special Requests   Final    BOTTLES DRAWN AEROBIC ONLY Blood Culture adequate volume   Culture   Final    NO GROWTH 5 DAYS Performed at [facility], [place] [address] [place], [place] [address]    Report Status 03/27/2019 FINAL  Final     Labs: BNP (last 3 results) No results  for input(s): BNP in the last 8760 hours. Basic Metabolic Panel: Recent Labs  Lab 03/22/19 2213 03/23/19 0202 03/23/19 0757 03/23/19 1203 03/24/19 0453  NA 138 135 138 138 140  K 4.3 4.1 3.9 3.7 3.7  CL 110 108 107 108 108  CO2 19* 20* 20* 21* 22  GLUCOSE 202* 229* 160* 116* 212*  BUN _0 CREATININE 1.12* 1.05* 1.01* 0.99 1.08*  CALCIUM 8.6* 8.5* 8.6* 8.6* 8.6*   Liver Function Tests: Recent Labs  Lab 03/22/19 1029 03/22/19 1415  AST 30 26  ALT 31 29  ALKPHOS 107 99  BILITOT 0.5 0.3  PROT 7.9 7.2  ALBUMIN 3.7 3.4*   No results for input(s): LIPASE, AMYLASE in the last 168 hours. No results for input(s): AMMONIA in the last 168 hours. CBC: Recent Labs  Lab 03/21/19 2035 03/22/19 0319 03/23/19 0757 03/24/19 0453  WBC 8.8  --  15.6* 12.7*  HGB 11.9* 11.9* 11.5* 11.7*  HCT 37.2 35.0* 36.0 36.1  MCV 87.1  --  88.5 88.0  PLT 310  --  303 332   Cardiac Enzymes: Recent Labs  Lab 03/22/19 1415  CKTOTAL 85   BNP: Invalid input(s): POCBNP CBG: Recent Labs  Lab 03/25/19 1152 03/25/19 1741 03/25/19 2015 03/26/19 0830 03/26/19 1134  GLUCAP 119* 216* 281* 71 141*   D-Dimer No results for input(s): DDIMER in the last 72 hours. Hgb A1c No results for input(s): HGBA1C in the last 72 hours. Lipid Profile No results for input(s): CHOL, HDL, LDLCALC, TRIG, CHOLHDL, LDLDIRECT in the last 72 hours. Thyroid function studies No results for input(s): TSH, T4TOTAL, T3FREE, THYROIDAB in the last 72  hours.  Invalid input(s): FREET3 Anemia work up No results for input(s): VITAMINB12, FOLATE, FERRITIN, TIBC, IRON, RETICCTPCT in the last 72 hours. Urinalysis    Component Value Date/Time   COLORURINE STRAW (A) 03/22/2019 0522   APPEARANCEUR CLEAR 03/22/2019 0522   LABSPEC 1.039 (H) 03/22/2019 0522   PHURINE 5.0 03/22/2019 0522   GLUCOSEU >=500 (A) 03/22/2019 0522   HGBUR NEGATIVE 03/22/2019 0522   BILIRUBINUR NEGATIVE 03/22/2019 0522   BILIRUBINUR neg 04/12/2018 1006   KETONESUR 5 (A) 03/22/2019 0522   PROTEINUR NEGATIVE 03/22/2019 0522   UROBILINOGEN 0.2 04/12/2018 1006   UROBILINOGEN 0.2 11/18/2014 2325   NITRITE NEGATIVE 03/22/2019 0522   LEUKOCYTESUR NEGATIVE 03/22/2019 0522   Sepsis Labs Invalid input(s): PROCALCITONIN,  WBC,  LACTICIDVEN Microbiology Recent Results (from the past 240 hour(s))  SARS Coronavirus 2 (CEPHEID- Performed in [place] hospital lab), Hosp Order     Status: None   Collection Time: 03/22/19  1:29 AM   Specimen: Nasopharyngeal Swab  Result Value Ref Range Status   SARS Coronavirus 2 NEGATIVE NEGATIVE Final    Comment: (NOTE) If result is NEGATIVE SARS-CoV-2 target nucleic acids are NOT DETECTED. The SARS-CoV-2 RNA is generally detectable in upper and lower  respiratory specimens during the acute phase of infection. The lowest  concentration of SARS-CoV-2 viral copies this assay can detect is 250  copies / mL. A negative result does not preclude SARS-CoV-2 infection  and should not be used as the sole basis for treatment or other  patient management decisions.  A negative result may occur with  improper specimen collection / handling, submission of specimen other  than nasopharyngeal swab, presence of viral mutation(s) within the  areas targeted by this assay, and inadequate number of viral copies  (<250 copies / mL). A negative result must be  combined with clinical  observations, patient history, and epidemiological information. If result  is POSITIVE SARS-CoV-2 target nucleic acids are DETECTED. The SARS-CoV-2 RNA is generally detectable in upper and lower  respiratory specimens dur ing the acute phase of infection.  Positive  results are indicative of active infection with SARS-CoV-2.  Clinical  correlation with patient history and other diagnostic information is  necessary to determine patient infection status.  Positive results do  not rule out bacterial infection or co-infection with other viruses. If result is PRESUMPTIVE POSTIVE SARS-CoV-2 nucleic acids MAY BE PRESENT.   A presumptive positive result was obtained on the submitted specimen  and confirmed on repeat testing.  While 2019 novel coronavirus  (SARS-CoV-2) nucleic acids may be present in the submitted sample  additional confirmatory testing may be necessary for epidemiological  and / or clinical management purposes  to differentiate between  SARS-CoV-2 and other Sarbecovirus currently known to infect humans.  If clinically indicated additional testing with an alternate test  methodology [phone]) is advised. The SARS-CoV-2 RNA is generally  detectable in upper and lower respiratory sp ecimens during the acute  phase of infection. The expected result is Negative. Fact Sheet for Patients:  StrictlyIdeas.no Fact Sheet for Healthcare Providers: BankingDealers.co.za This test is not yet approved or cleared by the [place] FDA and has been authorized for detection and/or diagnosis of SARS-CoV-2 by FDA under an Emergency Use Authorization (EUA).  This EUA will remain in effect (meaning this test can be used) for the duration of the COVID-19 declaration under Section 564(b)(1) of the Act, 21 U.S.C. section 360bbb-3(b)(1), unless the authorization is terminated or revoked sooner. Performed at [facility], [place] [address], [place], [place] [address]   Blood culture (routine x 2)     Status: None    Collection Time: 03/22/19  2:10 AM   Specimen: BLOOD  Result Value Ref Range Status   Specimen Description BLOOD RIGHT ANTECUBITAL  Final   Special Requests   Final    BOTTLES DRAWN AEROBIC AND ANAEROBIC Blood Culture results may not be optimal due to an inadequate volume of blood received in culture bottles   Culture   Final    NO GROWTH 5 DAYS Performed at [facility], [place] [address] [place], [place] [address]    Report Status 03/27/2019 FINAL  Final  Blood culture (routine x 2)     Status: None   Collection Time: 03/22/19  3:31 AM   Specimen: BLOOD RIGHT WRIST  Result Value Ref Range Status   Specimen Description BLOOD RIGHT WRIST  Final   Special Requests   Final    BOTTLES DRAWN AEROBIC ONLY Blood Culture adequate volume   Culture   Final    NO GROWTH 5 DAYS Performed at [facility], [place] [address], [place], [place] [address]    Report Status 03/27/2019 FINAL  Final     Time coordinating discharge: 32 minutes  SIGNED:   Hosie Poisson, MD  [facility] 03/27/2019, 3:29 PM Pager   If 7PM-7AM, please contact night-coverage [email or web address] Password [email or web address]

## 2019-03-28 ENCOUNTER — Telehealth: Payer: Self-pay

## 2019-03-28 DIAGNOSIS — J9500 Unspecified tracheostomy complication: Secondary | ICD-10-CM | POA: Diagnosis not present

## 2019-03-28 NOTE — Telephone Encounter (Signed)
Dr Margarita Rana -   From the discharge call.  She has an appointment with you on 03/31/2019.    She said that she is still having chest pain.  She stated that " the muscles around my chest flared up."  She said that it is a sharp pain in the middle of her chest, radiates to her throat. She explained that the the pain is the same pain that she experienced when she was admitted to the hospital and while she was in the hospital.  She rated the pain 7/10 and said that she takes oxycodone every 6 hours and robaxin every 8 hours as ordered. She did note that the pain has been worse and is somewhat better. She said that she does not need or want to go back to the hospital because of the pain.

## 2019-03-28 NOTE — Telephone Encounter (Signed)
We can set up a follow-up virtual or tele visit.

## 2019-03-28 NOTE — Telephone Encounter (Signed)
Transition Care Management Follow-up Telephone Call  Date of discharge and from where: 03/26/2019, [facility]   How have you been since you were released from the hospital? She said that she is still having chest pain.  She stated that " the muscles around my chest flared up."  She said that it is a sharp pain in the middle of her chest, radiates to her throat. She explained that the the pain is the same pain that she experienced when she was admitted to the hospital and while she was in the hospital.  She rated the pain 7/10 and said that she takes oxycodone every 6 hours and robaxin every 8 hours as ordered. She did note that the pain has been worse and is somewhat better. She said that she does not need or want to go back to the hospital because of the pain.   Any questions or concerns? Her only concern was regarding her on going chest pain.   Items Reviewed:  Did the pt receive and understand the discharge instructions provided? Yes , she has the instructions and has no questions.   Medications obtained and verified? She said that she has all medications. Did not have the list with her at the time of this call. She also noted that she did not need to review the list and did not have any questions.   Any new allergies since your discharge?none reported   Do you have support at home? Yes, family.  Her daughter and grandson were home with her.   Other (ie: DME, Home Health, etc) she has a nebulizer and glucometer.   Functional Questionnaire: (I = Independent and D = Dependent) ADL's: independent, family assists when needed. Ambulating without an assistive device.  Follow up appointments reviewed:    PCP Hospital f/u appt confirmed? Scheduled a televisit with PCP for 03/31/2019 @ 1050.   [facility] f/u appt confirmed? She has appointments scheduled with specialists at [facility]. She also has an appointment with endocrinology tomorrow.   Are transportation arrangements  needed?no   If their condition worsens, is the pt aware to call  their PCP or go to the ED? yes  Was the patient provided with contact information for the PCP's office or ED? She has the phone number for the clinic  Was the pt encouraged to call back with questions or concerns?yes

## 2019-03-29 ENCOUNTER — Other Ambulatory Visit: Payer: Self-pay

## 2019-03-29 ENCOUNTER — Encounter: Payer: Self-pay | Admitting: Endocrinology

## 2019-03-29 ENCOUNTER — Ambulatory Visit (INDEPENDENT_AMBULATORY_CARE_PROVIDER_SITE_OTHER): Payer: Medicare HMO | Admitting: Endocrinology

## 2019-03-29 VITALS — BP 152/88 | HR 97 | Ht 61.0 in | Wt 250.8 lb

## 2019-03-29 DIAGNOSIS — E1142 Type 2 diabetes mellitus with diabetic polyneuropathy: Secondary | ICD-10-CM | POA: Diagnosis not present

## 2019-03-29 DIAGNOSIS — J9601 Acute respiratory failure with hypoxia: Secondary | ICD-10-CM | POA: Diagnosis not present

## 2019-03-29 DIAGNOSIS — J45909 Unspecified asthma, uncomplicated: Secondary | ICD-10-CM | POA: Diagnosis not present

## 2019-03-29 DIAGNOSIS — Z930 Tracheostomy status: Secondary | ICD-10-CM | POA: Diagnosis not present

## 2019-03-29 DIAGNOSIS — J449 Chronic obstructive pulmonary disease, unspecified: Secondary | ICD-10-CM | POA: Diagnosis not present

## 2019-03-29 DIAGNOSIS — G4733 Obstructive sleep apnea (adult) (pediatric): Secondary | ICD-10-CM | POA: Diagnosis not present

## 2019-03-29 MED ORDER — ACCU-CHEK AVIVA VI STRP: 1.0000 | ORAL_STRIP | Freq: Two times a day (BID) | 3 refills | Status: DC

## 2019-03-29 MED ORDER — TRESIBA FLEXTOUCH 200 UNIT/ML ~~LOC~~ SOPN: 160.0000 [IU] | PEN_INJECTOR | Freq: Every day | SUBCUTANEOUS | 11 refills | Status: DC

## 2019-03-29 NOTE — Patient Instructions (Addendum)
Your blood pressure is high today.  Please see your primary care provider soon, to have it rechecked Please increase the Tresiba to 160 units each morning.  If you miss this, you can take all of it as soon as you can.    please come back for a follow-up appointment in 2 weeks.   check your blood sugar twice a day.  vary the time of day when you check, between before the 3 meals, and at bedtime.  also check if you have symptoms of your blood sugar being too high or too low.  please keep a record of the readings and bring it to your next appointment here (or you can bring the meter itself).  You can write it on any piece of paper.  please call us sooner if your blood sugar goes below 70, or if you have a lot of readings over 200.

## 2019-03-29 NOTE — Progress Notes (Signed)
Subjective:    Patient ID: [name], female    DOB: [birth year], 48 y.o.   MRN: [id number]  HPI Pt returns for f/u of diabetes mellitus: DM type: 1 Dx'ed: 9924 Complications: renal insuff and painful polyneuropathy.  Therapy: insulin since 2012 GDM: never DKA: once, in 2020 Severe hypoglycemia: never.  Pancreatitis: never.   Other: she declines weight loss surgery; edema precludes pioglitizone rx; she did not tolerate metformin (diarrhea); she is not a candidate for multiple daily injections, due to missing insulin doses.    Interval history: pt was in hosp last week, for mild DKA.  Pt was in the hospital for asthma exac.  She says she was not on steroids prior to admission.  She also says she has not missed any insulin doses, and that she can afford it.  She is off steroids now.   Past Medical History:  Diagnosis Date  . Arthritis   . Asthma   . Chronic back pain   . Chronic chest pain   . Depression   . DM (diabetes mellitus) ([place])    INSULIN DEPENDENT  . GERD (gastroesophageal reflux disease)   . Headache(784.0)   . Hypertension   . Hypokalemia   . Respiratory disease 05/2017  . Tracheostomy in place [facility]) 02/2017    Past Surgical History:  Procedure Laterality Date  . APPENDECTOMY    . CESAREAN SECTION     x 3  . CHOLECYSTECTOMY N/A 03/02/2014   Procedure: LAPAROSCOPIC CHOLECYSTECTOMY;  Surgeon: Joyice Faster. Cornett, MD;  Location: [place];  Service: General;  Laterality: N/A;  . HERNIA REPAIR    . PANENDOSCOPY N/A 03/04/2017   Procedure: PANENDOSCOPY WITH POSSIBLE FOREIGN BODY REMOVAL;  Surgeon: Jodi Marble, MD;  Location: [place];  Service: ENT;  Laterality: N/A;  . ROTATOR CUFF REPAIR    . TRACHEOSTOMY  02/2017  . VESICOVAGINAL FISTULA CLOSURE W/ TAH  2009    Social History   Socioeconomic History  . Marital status: Married    Spouse name: Not on file  . Number of children: Not on file  . Years of education: Not on file  . Highest  education level: Not on file  Occupational History  . Not on file  Social Needs  . Financial resource strain: Not on file  . Food insecurity    Worry: Not on file    Inability: Not on file  . Transportation needs    Medical: Not on file    Non-medical: Not on file  Tobacco Use  . Smoking status: Former Smoker    Packs/day: 0.25    Years: 22.00    Pack years: 5.50    Types: Cigarettes    Quit date: 01/20/2017    Years since quitting: 2.1  . Smokeless tobacco: Never Used  Substance and Sexual Activity  . Alcohol use: No    Alcohol/week: 1.0 - 2.0 standard drinks    Types: 1 - 2 Glasses of wine per week    Comment: socially  . Drug use: No  . Sexual activity: Yes    Partners: Male    Birth control/protection: None  Lifestyle  . Physical activity    Days per week: Not on file    Minutes per session: Not on file  . Stress: Not on file  Relationships  . Social Herbalist on phone: Not on file    Gets together: Not on file    Attends religious service: Not on file  Active member of club or organization: Not on file    Attends meetings of clubs or organizations: Not on file    Relationship status: Not on file  . Intimate partner violence    Fear of current or ex partner: Not on file    Emotionally abused: Not on file    Physically abused: Not on file    Forced sexual activity: Not on file  Other Topics Concern  . Not on file  Social History Narrative  . Not on file    Current Outpatient Medications on File Prior to Visit  Medication Sig Dispense Refill  . albuterol (PROAIR HFA) 108 (90 Base) MCG/ACT inhaler Inhale 2 puffs into the lungs every 6 (six) hours as needed for wheezing or shortness of breath. 1 Inhaler 3  . albuterol (PROVENTIL) (2.5 MG/3ML) 0.083% nebulizer solution Take 3 mLs (2.5 mg total) by nebulization every 6 (six) hours as needed for wheezing or shortness of breath. 75 mL 3  . aspirin-acetaminophen-caffeine (EXCEDRIN MIGRAINE) 250-250-65 MG  tablet Take 2 tablets by mouth every 6 (six) hours as needed for headache.    . Blood Glucose Monitoring Suppl (ACCU-CHEK AVIVA PLUS) w/Device KIT Used to check blood sugars once daily. E11.9 1 kit 0  . gabapentin (NEURONTIN) 300 MG capsule Take 2 capsules (600 mg total) by mouth 2 (two) times daily. 360 capsule 1  . ibuprofen (ADVIL,MOTRIN) 600 MG tablet Take 1 tablet (600 mg total) by mouth every 8 (eight) hours as needed. (Patient taking differently: Take 600 mg by mouth every 8 (eight) hours as needed for mild pain. ) 30 tablet 0  . Insulin Pen Needle (FIFTY50 PEN NEEDLES) 31G X 8 MM MISC Inject 1 Units as directed daily.    . Melatonin 3 MG TABS Take 3 mg by mouth at bedtime as needed (for sleep).     . [DISCONTINUED] fluticasone-salmeterol (ADVAIR HFA) 230-21 MCG/ACT inhaler Inhale 2 puffs into the lungs 2 (two) times daily. 1 Inhaler 3   No current facility-administered medications on file prior to visit.     Allergies  Allergen Reactions  . Reglan [Metoclopramide] Other (See Comments)    Panic attack    Family History  Problem Relation Age of Onset  . Heart attack Mother   . Stroke Mother   . Diabetes Mother   . Hypertension Mother   . Arthritis Mother   . Stroke Father   . Hypertension Sister   . Diabetes Sister   . Seizures Brother   . Diabetes Brother     BP (!) 152/88 (BP Location: Left Arm, Patient Position: Sitting, Cuff Size: Large)   Pulse 97   Ht 5' 1" (1.549 m)   Wt 250 lb 12.8 oz (113.8 kg)   SpO2 97%   BMI 47.39 kg/m   Review of Systems She denies hypoglycemia.      Objective:   Physical Exam VITAL SIGNS:  See vs page GENERAL: no distress Pulses: dorsalis pedis intact bilat.   MSK: no deformity of the feet.  CV: no leg edema.   Skin:  no ulcer on the feet.  normal color and temp on the feet.  Neuro: sensation is intact to touch on the feet.     Lab Results  Component Value Date   HGBA1C 13.7 (H) 03/22/2019       Assessment & Plan:  HTN:  is noted today Type 1 DM, with renal insuff: worse DKA: this is evidence of noncompliance with insulin rx.  Patient  Instructions  Your blood pressure is high today.  Please see your primary care provider soon, to have it rechecked Please increase the Tresiba to 160 units each morning.  If you miss this, you can take all of it as soon as you can.    please come back for a follow-up appointment in 2 weeks.   check your blood sugar twice a day.  vary the time of day when you check, between before the 3 meals, and at bedtime.  also check if you have symptoms of your blood sugar being too high or too low.  please keep a record of the readings and bring it to your next appointment here (or you can bring the meter itself).  You can write it on any piece of paper.  please call us sooner if your blood sugar goes below 70, or if you have a lot of readings over 200.

## 2019-03-30 ENCOUNTER — Other Ambulatory Visit: Payer: Self-pay

## 2019-03-30 DIAGNOSIS — E1142 Type 2 diabetes mellitus with diabetic polyneuropathy: Secondary | ICD-10-CM

## 2019-03-30 DIAGNOSIS — J9500 Unspecified tracheostomy complication: Secondary | ICD-10-CM | POA: Diagnosis not present

## 2019-03-30 MED ORDER — ACCU-CHEK AVIVA VI STRP: 1.0000 | ORAL_STRIP | Freq: Two times a day (BID) | 3 refills | Status: DC

## 2019-03-30 MED ORDER — ACCU-CHEK SOFTCLIX LANCETS MISC: 12 refills | Status: DC

## 2019-03-31 ENCOUNTER — Encounter: Payer: Self-pay | Admitting: Family Medicine

## 2019-03-31 ENCOUNTER — Other Ambulatory Visit: Payer: Self-pay

## 2019-03-31 ENCOUNTER — Telehealth (HOSPITAL_BASED_OUTPATIENT_CLINIC_OR_DEPARTMENT_OTHER): Payer: Medicare HMO | Admitting: Family Medicine

## 2019-03-31 DIAGNOSIS — I10 Essential (primary) hypertension: Secondary | ICD-10-CM

## 2019-03-31 DIAGNOSIS — J4540 Moderate persistent asthma, uncomplicated: Secondary | ICD-10-CM

## 2019-03-31 DIAGNOSIS — F330 Major depressive disorder, recurrent, mild: Secondary | ICD-10-CM

## 2019-03-31 DIAGNOSIS — E7800 Pure hypercholesterolemia, unspecified: Secondary | ICD-10-CM | POA: Diagnosis not present

## 2019-03-31 DIAGNOSIS — R0789 Other chest pain: Secondary | ICD-10-CM | POA: Diagnosis not present

## 2019-03-31 DIAGNOSIS — J9500 Unspecified tracheostomy complication: Secondary | ICD-10-CM | POA: Diagnosis not present

## 2019-03-31 DIAGNOSIS — G43119 Migraine with aura, intractable, without status migrainosus: Secondary | ICD-10-CM

## 2019-03-31 DIAGNOSIS — R69 Illness, unspecified: Secondary | ICD-10-CM | POA: Diagnosis not present

## 2019-03-31 MED ORDER — BUDESONIDE-FORMOTEROL FUMARATE 160-4.5 MCG/ACT IN AERO: 2.0000 | INHALATION_SPRAY | Freq: Two times a day (BID) | RESPIRATORY_TRACT | 1 refills | Status: DC

## 2019-03-31 MED ORDER — CARVEDILOL 25 MG PO TABS: 25.0000 mg | ORAL_TABLET | Freq: Two times a day (BID) | ORAL | 1 refills | Status: DC

## 2019-03-31 MED ORDER — SUMATRIPTAN SUCCINATE 100 MG PO TABS: ORAL_TABLET | ORAL | 1 refills | Status: DC

## 2019-03-31 MED ORDER — TIZANIDINE HCL 4 MG PO TABS: 4.0000 mg | ORAL_TABLET | Freq: Three times a day (TID) | ORAL | 3 refills | Status: DC | PRN

## 2019-03-31 MED ORDER — HYDROXYZINE HCL 25 MG PO TABS: 25.0000 mg | ORAL_TABLET | Freq: Three times a day (TID) | ORAL | 1 refills | Status: DC | PRN

## 2019-03-31 MED ORDER — SERTRALINE HCL 100 MG PO TABS: 100.0000 mg | ORAL_TABLET | Freq: Every day | ORAL | 1 refills | Status: DC

## 2019-03-31 MED ORDER — MONTELUKAST SODIUM 10 MG PO TABS: 10.0000 mg | ORAL_TABLET | Freq: Every day | ORAL | 1 refills | Status: DC

## 2019-03-31 MED ORDER — AMLODIPINE BESYLATE 10 MG PO TABS: 10.0000 mg | ORAL_TABLET | Freq: Every day | ORAL | 1 refills | Status: DC

## 2019-03-31 MED ORDER — BUPROPION HCL ER (SR) 150 MG PO TB12: 150.0000 mg | ORAL_TABLET | Freq: Two times a day (BID) | ORAL | 3 refills | Status: DC

## 2019-03-31 MED ORDER — ATORVASTATIN CALCIUM 20 MG PO TABS: 20.0000 mg | ORAL_TABLET | Freq: Every day | ORAL | 1 refills | Status: DC

## 2019-03-31 MED ORDER — FUROSEMIDE 20 MG PO TABS: 20.0000 mg | ORAL_TABLET | Freq: Every day | ORAL | 2 refills | Status: DC

## 2019-03-31 MED ORDER — LISINOPRIL 10 MG PO TABS: 10.0000 mg | ORAL_TABLET | Freq: Every day | ORAL | 1 refills | Status: DC

## 2019-03-31 NOTE — Progress Notes (Signed)
Virtual Visit via Video Note  I connected with [name], on 03/31/2019 at 11:29 AM by video enabled telemedicine device due to the COVID-19 pandemic and verified that I am speaking with the correct person using two identifiers.   Consent: I discussed the limitations, risks, security and privacy concerns of performing an evaluation and management service by telemedicine and the availability of in person appointments. I also discussed with the patient that there may be a patient responsible charge related to this service. The patient expressed understanding and agreed to proceed.   Location of Patient: Home  Location of Provider: Clinic   Persons participating in Telemedicine visit: [name] [name] Dr. Felecia Shelling     History of Present Illness: [name]  is a 48 year old female with a history of type 2 diabetes mellitus (A1c 13.7), hypertension, obstructive sleep apnea, asthma, depression, s/p tracheostomy secondary to intubation related tracheal injury, subglottic stenosis (status post balloon dilatation and Kenalog injection of stenosis tracheostomy tube exchange by ENT at [facility]) here for follow-up visit at the transitional care clinic after recent hospitalization at [facility] from 03/21/2019 through 03/26/2019.  She had presented with chest pain, troponins were normal, labs revealed leukocytosis, EKG unremarkable, CT angiogram of the chest revealed no evidence of PE, presence of cardiomegaly and hepatic steatosis. She was admitted for acute asthma exacerbation, SARS-CoV-2 was negative.  She was treated with Rocephin and azithromycin with improvement in her condition and prescribed oxycodone for musculoskeletal pain.  Today she informs me pain is a 6/10.  Pain is worse when she attempts to speak and she finds she is using a lot of effort and has to close her trach.  She also has slight pain around the trach.  she denies wheezing, dyspnea.  No longer needs oxygen (was previously on 5 L).  Her cough has improved, she denies presence of wheezing, dyspnea fever or myalgias. She was seen by her endocrinologist yesterday with adjustment in her Tresiba dose.  Her blood pressure was elevated at that visit. She has an upcoming appointment with her ENT for trach follow-up.  Past Medical History:  Diagnosis Date  . Arthritis   . Asthma   . Chronic back pain   . Chronic chest pain   . Depression   . DM (diabetes mellitus) ([place])    INSULIN DEPENDENT  . GERD (gastroesophageal reflux disease)   . Headache(784.0)   . Hypertension   . Hypokalemia   . Respiratory disease 05/2017  . Tracheostomy in place [facility]) 02/2017   Allergies  Allergen Reactions  . Reglan [Metoclopramide] Other (See Comments)    Panic attack    Current Outpatient Medications on File Prior to Visit  Medication Sig Dispense Refill  . Accu-Chek Softclix Lancets lancets Use to monitor glucose levels BID; E11.9 100 each 12  . albuterol (PROAIR HFA) 108 (90 Base) MCG/ACT inhaler Inhale 2 puffs into the lungs every 6 (six) hours as needed for wheezing or shortness of breath. 1 Inhaler 3  . albuterol (PROVENTIL) (2.5 MG/3ML) 0.083% nebulizer solution Take 3 mLs (2.5 mg total) by nebulization every 6 (six) hours as needed for wheezing or shortness of breath. 75 mL 3  . amLODipine (NORVASC) 10 MG tablet Take 1 tablet (10 mg total) by mouth daily. 90 tablet 1  . aspirin-acetaminophen-caffeine (EXCEDRIN MIGRAINE) 250-250-65 MG tablet Take 2 tablets by mouth every 6 (six) hours as needed for headache.    Marland Kitchen atorvastatin (LIPITOR) 20 MG tablet Take 1 tablet (  20 mg total) by mouth daily. 90 tablet 1  . Blood Glucose Monitoring Suppl (ACCU-CHEK AVIVA PLUS) w/Device KIT Used to check blood sugars once daily. E11.9 1 kit 0  . budesonide-formoterol (SYMBICORT) 160-4.5 MCG/ACT inhaler Inhale 2 puffs into the lungs 2 (two) times daily. 3 Inhaler 1  .  buPROPion (WELLBUTRIN SR) 150 MG 12 hr tablet Take 1 tablet (150 mg total) by mouth 2 (two) times daily. 60 tablet 3  . carvedilol (COREG) 25 MG tablet Take 1 tablet (25 mg total) by mouth 2 (two) times daily with a meal. 60 tablet 3  . furosemide (LASIX) 20 MG tablet Take 1 tablet (20 mg total) by mouth daily. 30 tablet 2  . gabapentin (NEURONTIN) 300 MG capsule Take 2 capsules (600 mg total) by mouth 2 (two) times daily. 360 capsule 1  . glucose blood (ACCU-CHEK AVIVA) test strip 1 each by Other route 2 (two) times a day. Use to monitor glucose levels BID; E11.9 180 each 3  . hydrOXYzine (ATARAX/VISTARIL) 25 MG tablet Take 1 tablet (25 mg total) by mouth 3 (three) times daily as needed for anxiety. 60 tablet 1  . ibuprofen (ADVIL,MOTRIN) 600 MG tablet Take 1 tablet (600 mg total) by mouth every 8 (eight) hours as needed. (Patient taking differently: Take 600 mg by mouth every 8 (eight) hours as needed for mild pain. ) 30 tablet 0  . Insulin Degludec (TRESIBA FLEXTOUCH) 200 UNIT/ML SOPN Inject 160 Units into the skin daily. 12 pen 11  . Insulin Pen Needle (FIFTY50 PEN NEEDLES) 31G X 8 MM MISC Inject 1 Units as directed daily.    Marland Kitchen lisinopril (PRINIVIL,ZESTRIL) 5 MG tablet Take 1 tablet (5 mg total) by mouth daily. 30 tablet 3  . Melatonin 3 MG TABS Take 3 mg by mouth at bedtime as needed (for sleep).     . montelukast (SINGULAIR) 10 MG tablet Take 1 tablet (10 mg total) by mouth at bedtime. 90 tablet 1  . sertraline (ZOLOFT) 100 MG tablet Take 1 tablet (100 mg total) by mouth at bedtime. 30 tablet 3  . SUMAtriptan (IMITREX) 100 MG tablet At the onset of a migraine; may repeat in 2 hours. Max daily dose 241m (Patient taking differently: Take 100 mg by mouth every 2 (two) hours as needed for migraine. ) 20 tablet 1  . tiZANidine (ZANAFLEX) 4 MG tablet Take 1 tablet (4 mg total) by mouth every 8 (eight) hours as needed for muscle spasms. 90 tablet 3  . [DISCONTINUED] fluticasone-salmeterol (ADVAIR HFA)  230-21 MCG/ACT inhaler Inhale 2 puffs into the lungs 2 (two) times daily. 1 Inhaler 3   No current facility-administered medications on file prior to visit.     Observations/Objective: Awake, alert, oriented x3 Does not appear to be in distress Trach collar in place with no evidence of infection surrounding trach  CMP Latest Ref Rng & Units 03/24/2019 03/23/2019 03/23/2019  Glucose 70 - 99 mg/dL 212(H) 116(H) 160(H)  BUN 6 - 20 mg/dL 19 16 16   Creatinine 0.44 - 1.00 mg/dL 1.08(H) 0.99 1.01(H)  Sodium 135 - 145 mmol/L 140 138 138  Potassium 3.5 - 5.1 mmol/L 3.7 3.7 3.9  Chloride 98 - 111 mmol/L 108 108 107  CO2 22 - 32 mmol/L 22 21(L) 20(L)  Calcium 8.9 - 10.3 mg/dL 8.6(L) 8.6(L) 8.6(L)  Total Protein 6.5 - 8.1 g/dL - - -  Total Bilirubin 0.3 - 1.2 mg/dL - - -  Alkaline Phos 38 - 126 U/L - - -  AST 15 - 41 U/L - - -  ALT 0 - 44 U/L - - -    Lipid Panel     Component Value Date/Time   CHOL 165 06/15/2018 1031   TRIG 124 06/15/2018 1031   HDL 51 06/15/2018 1031   CHOLHDL 3.2 06/15/2018 1031   CHOLHDL 3.9 02/22/2016 0954   VLDL 26 02/22/2016 0954   LDLCALC 89 06/15/2018 1031    Lab Results  Component Value Date   HGBA1C 13.7 (H) 03/22/2019    Assessment and Plan: 1. Benign essential HTN Blood pressure was elevated at her visit with endocrine yesterday Acute pain could be contributing to this Increased dose of lisinopril - lisinopril (ZESTRIL) 10 MG tablet; Take 1 tablet (10 mg total) by mouth daily.  Dispense: 90 tablet; Refill: 1 - amLODipine (NORVASC) 10 MG tablet; Take 1 tablet (10 mg total) by mouth daily.  Dispense: 90 tablet; Refill: 1 - carvedilol (COREG) 25 MG tablet; Take 1 tablet (25 mg total) by mouth 2 (two) times daily with a meal.  Dispense: 180 tablet; Refill: 1  2. Intractable migraine with aura without status migrainosus Stable - SUMAtriptan (IMITREX) 100 MG tablet; At the onset of a migraine; may repeat in 2 hours. Max daily dose 256m  Dispense: 20  tablet; Refill: 1  3. Moderate persistent asthma without complication Recent exacerbation has subsided No indication for prednisone at this time If symptoms worsen consider adding prednisone and possibly referring to pulmonary as she is on maximum doses of her medications - montelukast (SINGULAIR) 10 MG tablet; Take 1 tablet (10 mg total) by mouth at bedtime.  Dispense: 90 tablet; Refill: 1 - budesonide-formoterol (SYMBICORT) 160-4.5 MCG/ACT inhaler; Inhale 2 puffs into the lungs 2 (two) times daily.  Dispense: 3 Inhaler; Refill: 1  4. Mild episode of recurrent major depressive disorder (HCC) Stable - sertraline (ZOLOFT) 100 MG tablet; Take 1 tablet (100 mg total) by mouth at bedtime.  Dispense: 90 tablet; Refill: 1 - hydrOXYzine (ATARAX/VISTARIL) 25 MG tablet; Take 1 tablet (25 mg total) by mouth 3 (three) times daily as needed for anxiety.  Dispense: 60 tablet; Refill: 1  5. Pure hypercholesterolemia Controlled Low-cholesterol diet - atorvastatin (LIPITOR) 20 MG tablet; Take 1 tablet (20 mg total) by mouth daily.  Dispense: 90 tablet; Refill: 1  6. Musculoskeletal chest pain - tiZANidine (ZANAFLEX) 4 MG tablet; Take 1 tablet (4 mg total) by mouth every 8 (eight) hours as needed for muscle spasms.  Dispense: 90 tablet; Refill: 3   Follow Up Instructions: 3 months   I discussed the assessment and treatment plan with the patient. The patient was provided an opportunity to ask questions and all were answered. The patient agreed with the plan and demonstrated an understanding of the instructions.   The patient was advised to call back or seek an in-person evaluation if the symptoms worsen or if the condition fails to improve as anticipated.     I provided 26 minutes total of Telehealth time during this encounter including median intraservice time, reviewing previous notes, labs, imaging, medications and explaining diagnosis and management.     ECharlott Rakes MD, FAAFP. [facility]  03/31/2019, 11:29 AM

## 2019-03-31 NOTE — Progress Notes (Signed)
Patient is having chest pain. Patient states that it hurts when she breathes. Patient was seen in ED for problem and was given medication.  Patient is having rotator cuff surgery on Aug 11,2020
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
## 2019-04-04 DIAGNOSIS — J9500 Unspecified tracheostomy complication: Secondary | ICD-10-CM | POA: Diagnosis not present

## 2019-04-05 ENCOUNTER — Ambulatory Visit: Payer: Medicare HMO | Admitting: Family Medicine

## 2019-04-06 DIAGNOSIS — J9500 Unspecified tracheostomy complication: Secondary | ICD-10-CM | POA: Diagnosis not present

## 2019-04-07 DIAGNOSIS — J9500 Unspecified tracheostomy complication: Secondary | ICD-10-CM | POA: Diagnosis not present

## 2019-04-08 ENCOUNTER — Other Ambulatory Visit: Payer: Self-pay

## 2019-04-11 DIAGNOSIS — J9500 Unspecified tracheostomy complication: Secondary | ICD-10-CM | POA: Diagnosis not present

## 2019-04-12 ENCOUNTER — Encounter: Payer: Self-pay | Admitting: Endocrinology

## 2019-04-12 ENCOUNTER — Other Ambulatory Visit: Payer: Self-pay

## 2019-04-12 ENCOUNTER — Ambulatory Visit (INDEPENDENT_AMBULATORY_CARE_PROVIDER_SITE_OTHER): Payer: Medicare HMO | Admitting: Endocrinology

## 2019-04-12 DIAGNOSIS — E1029 Type 1 diabetes mellitus with other diabetic kidney complication: Secondary | ICD-10-CM

## 2019-04-12 DIAGNOSIS — E109 Type 1 diabetes mellitus without complications: Secondary | ICD-10-CM | POA: Diagnosis not present

## 2019-04-12 DIAGNOSIS — E119 Type 2 diabetes mellitus without complications: Secondary | ICD-10-CM | POA: Insufficient documentation

## 2019-04-12 DIAGNOSIS — R000 Tachycardia, unspecified: Secondary | ICD-10-CM

## 2019-04-12 MED ORDER — OZEMPIC (0.25 OR 0.5 MG/DOSE) 2 MG/1.5ML ~~LOC~~ SOPN: 0.2500 mg | PEN_INJECTOR | SUBCUTANEOUS | 11 refills | Status: DC

## 2019-04-12 NOTE — Progress Notes (Signed)
Subjective:    Patient ID: [name], female    DOB: 11/17/1970, 48 y.o.   MRN: [id number]  HPI Pt returns for f/u of diabetes mellitus: DM type: 1 Dx'ed: 6384 Complications: renal insuff and painful polyneuropathy.  Therapy: insulin since 2012 GDM: never DKA: once, in 2020 Severe hypoglycemia: never.  Pancreatitis: never.   Other: she declines weight loss surgery; edema precludes pioglitizone rx; she did not tolerate metformin (diarrhea); she is not a candidate for multiple daily injections, due to missing insulin doses.    Interval history: no cbg record, but states cbg's vary from 70-200's.  It is in general lowest if a meal is missed. She is off prednisone now.  Sob is improving.   Past Medical History:  Diagnosis Date  . Arthritis   . Asthma   . Chronic back pain   . Chronic chest pain   . Depression   . DM (diabetes mellitus) ([place])    INSULIN DEPENDENT  . GERD (gastroesophageal reflux disease)   . Headache(784.0)   . Hypertension   . Hypokalemia   . Respiratory disease 05/2017  . Tracheostomy in place [facility]) 02/2017    Past Surgical History:  Procedure Laterality Date  . APPENDECTOMY    . CESAREAN SECTION     x 3  . CHOLECYSTECTOMY N/A 03/02/2014   Procedure: LAPAROSCOPIC CHOLECYSTECTOMY;  Surgeon: Joyice Faster. Cornett, MD;  Location: [facility];  Service: General;  Laterality: N/A;  . HERNIA REPAIR    . PANENDOSCOPY N/A 03/04/2017   Procedure: PANENDOSCOPY WITH POSSIBLE FOREIGN BODY REMOVAL;  Surgeon: Jodi Marble, MD;  Location: [facility];  Service: ENT;  Laterality: N/A;  . ROTATOR CUFF REPAIR    . TRACHEOSTOMY  02/2017  . VESICOVAGINAL FISTULA CLOSURE W/ TAH  2009    Social History   Socioeconomic History  . Marital status: Married    Spouse name: Not on file  . Number of children: Not on file  . Years of education: Not on file  . Highest education level: Not on file  Occupational History  . Not on file  Social Needs  . Financial resource  strain: Not on file  . Food insecurity    Worry: Not on file    Inability: Not on file  . Transportation needs    Medical: Not on file    Non-medical: Not on file  Tobacco Use  . Smoking status: Former Smoker    Packs/day: 0.25    Years: 22.00    Pack years: 5.50    Types: Cigarettes    Quit date: 01/20/2017    Years since quitting: 2.2  . Smokeless tobacco: Never Used  Substance and Sexual Activity  . Alcohol use: No    Alcohol/week: 1.0 - 2.0 standard drinks    Types: 1 - 2 Glasses of wine per week    Comment: socially  . Drug use: No  . Sexual activity: Yes    Partners: Male    Birth control/protection: None  Lifestyle  . Physical activity    Days per week: Not on file    Minutes per session: Not on file  . Stress: Not on file  Relationships  . Social Herbalist on phone: Not on file    Gets together: Not on file    Attends religious service: Not on file    Active member of club or organization: Not on file    Attends meetings of clubs or organizations: Not on file  Relationship status: Not on file  . Intimate partner violence    Fear of current or ex partner: Not on file    Emotionally abused: Not on file    Physically abused: Not on file    Forced sexual activity: Not on file  Other Topics Concern  . Not on file  Social History Narrative  . Not on file    Current Outpatient Medications on File Prior to Visit  Medication Sig Dispense Refill  . albuterol (PROAIR HFA) 108 (90 Base) MCG/ACT inhaler Inhale 2 puffs into the lungs every 6 (six) hours as needed for wheezing or shortness of breath. 1 Inhaler 3  . albuterol (PROVENTIL) (2.5 MG/3ML) 0.083% nebulizer solution Take 3 mLs (2.5 mg total) by nebulization every 6 (six) hours as needed for wheezing or shortness of breath. 75 mL 3  . amLODipine (NORVASC) 10 MG tablet Take 1 tablet (10 mg total) by mouth daily. 90 tablet 1  . aspirin-acetaminophen-caffeine (EXCEDRIN MIGRAINE) 250-250-65 MG tablet Take  2 tablets by mouth every 6 (six) hours as needed for headache.    Marland Kitchen atorvastatin (LIPITOR) 20 MG tablet Take 1 tablet (20 mg total) by mouth daily. 90 tablet 1  . budesonide-formoterol (SYMBICORT) 160-4.5 MCG/ACT inhaler Inhale 2 puffs into the lungs 2 (two) times daily. 3 Inhaler 1  . buPROPion (WELLBUTRIN SR) 150 MG 12 hr tablet Take 1 tablet (150 mg total) by mouth 2 (two) times daily. 60 tablet 3  . carvedilol (COREG) 25 MG tablet Take 1 tablet (25 mg total) by mouth 2 (two) times daily with a meal. 180 tablet 1  . furosemide (LASIX) 20 MG tablet Take 1 tablet (20 mg total) by mouth daily. 30 tablet 2  . gabapentin (NEURONTIN) 300 MG capsule Take 2 capsules (600 mg total) by mouth 2 (two) times daily. 360 capsule 1  . hydrOXYzine (ATARAX/VISTARIL) 25 MG tablet Take 1 tablet (25 mg total) by mouth 3 (three) times daily as needed for anxiety. 60 tablet 1  . ibuprofen (ADVIL,MOTRIN) 600 MG tablet Take 1 tablet (600 mg total) by mouth every 8 (eight) hours as needed. (Patient taking differently: Take 600 mg by mouth every 8 (eight) hours as needed for mild pain. ) 30 tablet 0  . Insulin Degludec (TRESIBA FLEXTOUCH) 200 UNIT/ML SOPN Inject 160 Units into the skin daily. 12 pen 11  . Insulin Pen Needle (FIFTY50 PEN NEEDLES) 31G X 8 MM MISC Inject 1 Units as directed daily.    Marland Kitchen lisinopril (ZESTRIL) 10 MG tablet Take 1 tablet (10 mg total) by mouth daily. 90 tablet 1  . Melatonin 3 MG TABS Take 3 mg by mouth at bedtime as needed (for sleep).     . montelukast (SINGULAIR) 10 MG tablet Take 1 tablet (10 mg total) by mouth at bedtime. 90 tablet 1  . sertraline (ZOLOFT) 100 MG tablet Take 1 tablet (100 mg total) by mouth at bedtime. 90 tablet 1  . SUMAtriptan (IMITREX) 100 MG tablet At the onset of a migraine; may repeat in 2 hours. Max daily dose 200mg  20 tablet 1  . tiZANidine (ZANAFLEX) 4 MG tablet Take 1 tablet (4 mg total) by mouth every 8 (eight) hours as needed for muscle spasms. 90 tablet 3  .  [DISCONTINUED] fluticasone-salmeterol (ADVAIR HFA) 230-21 MCG/ACT inhaler Inhale 2 puffs into the lungs 2 (two) times daily. 1 Inhaler 3   No current facility-administered medications on file prior to visit.     Allergies  Allergen Reactions  .  Reglan [Metoclopramide] Other (See Comments)    Panic attack    Family History  Problem Relation Age of Onset  . Heart attack Mother   . Stroke Mother   . Diabetes Mother   . Hypertension Mother   . Arthritis Mother   . Stroke Father   . Hypertension Sister   . Diabetes Sister   . Seizures Brother   . Diabetes Brother     BP (!) 142/80 (BP Location: Right Wrist, Patient Position: Sitting, Cuff Size: Large)   Pulse (!) 114   Ht 5\' 1"  (1.549 m)   Wt 253 lb 12.8 oz (115.1 kg)   SpO2 97%   BMI 47.96 kg/m   Review of Systems Denies LOC    Objective:   Physical Exam VITAL SIGNS:  See vs page GENERAL: no distress Pulses: dorsalis pedis intact bilat.   MSK: no deformity of the feet CV: 1+ bilat leg leg edema Skin:  no ulcer on the feet.  normal color and temp on the feet. Neuro: sensation is intact to touch on the feet Ext: there is bilateral onychomycosis of the toenails.   Lab Results  Component Value Date   CREATININE 1.08 (H) 03/24/2019   BUN 19 03/24/2019   NA 140 03/24/2019   K 3.7 03/24/2019   CL 108 03/24/2019   CO2 22 03/24/2019       Assessment & Plan:  Your blood pressure is high today.  Please see your primary care provider soon, to have it rechecked Tachycardia: check TFT.   Type 1 DM, with renal insuff: she needs increased rx  Patient Instructions  Your blood pressure is high today.  Please see your primary care provider soon, to have it rechecked.  I have sent a prescription to your pharmacy, to add "Ozempic." Please continue the same [place].  If you miss this, you can make it up when you can.   please come back for a follow-up appointment in 1 month.   check your blood sugar twice a day.  vary the  time of day when you check, between before the 3 meals, and at bedtime.  also check if you have symptoms of your blood sugar being too high or too low.  please keep a record of the readings and bring it to your next appointment here (or you can bring the meter itself).  You can write it on any piece of paper.  please call us sooner if your blood sugar goes below 70, or if you have a lot of readings over 200.

## 2019-04-12 NOTE — Patient Instructions (Addendum)
Your blood pressure is high today.  Please see your primary care provider soon, to have it rechecked.  I have sent a prescription to your pharmacy, to add "Ozempic." Please continue the same [place].  If you miss this, you can make it up when you can.   please come back for a follow-up appointment in 1 month.   check your blood sugar twice a day.  vary the time of day when you check, between before the 3 meals, and at bedtime.  also check if you have symptoms of your blood sugar being too high or too low.  please keep a record of the readings and bring it to your next appointment here (or you can bring the meter itself).  You can write it on any piece of paper.  please call us sooner if your blood sugar goes below 70, or if you have a lot of readings over 200.

## 2019-04-13 ENCOUNTER — Telehealth: Payer: Medicare HMO | Admitting: Nurse Practitioner

## 2019-04-13 ENCOUNTER — Telehealth: Payer: Self-pay | Admitting: Orthopedic Surgery

## 2019-04-13 DIAGNOSIS — M545 Low back pain, unspecified: Secondary | ICD-10-CM

## 2019-04-13 DIAGNOSIS — J9500 Unspecified tracheostomy complication: Secondary | ICD-10-CM | POA: Diagnosis not present

## 2019-04-13 LAB — TSH: TSH: 0.84 u[IU]/mL (ref 0.35–4.50)

## 2019-04-13 LAB — T4, FREE: Free T4: 0.74 ng/dL (ref 0.60–1.60)

## 2019-04-13 MED ORDER — NAPROXEN 500 MG PO TABS: 500.0000 mg | ORAL_TABLET | Freq: Two times a day (BID) | ORAL | 0 refills | Status: DC

## 2019-04-13 MED ORDER — CYCLOBENZAPRINE HCL 10 MG PO TABS: 10.0000 mg | ORAL_TABLET | Freq: Three times a day (TID) | ORAL | 0 refills | Status: DC | PRN

## 2019-04-13 NOTE — Progress Notes (Signed)

## 2019-04-13 NOTE — Telephone Encounter (Signed)
[name] states that she is having increased shoulder pain and swelling.  She has surgery scheduled for 05/03/19, but will need something to help her with the pain until that date.  She has been cleaning up recently and has noticed that makes her shoulder ache more.  She uses the [facility] on [address].

## 2019-04-13 NOTE — Telephone Encounter (Signed)
Please advise. Thanks.  

## 2019-04-13 NOTE — Telephone Encounter (Signed)
Okay for tramadol 1 p.o. every 8-12 hours as needed pain #30 with no refills.  Anything stronger is got a make her recovery harder

## 2019-04-14 ENCOUNTER — Other Ambulatory Visit: Payer: Self-pay

## 2019-04-14 ENCOUNTER — Other Ambulatory Visit: Payer: Self-pay | Admitting: Surgical

## 2019-04-14 DIAGNOSIS — J9500 Unspecified tracheostomy complication: Secondary | ICD-10-CM | POA: Diagnosis not present

## 2019-04-14 DIAGNOSIS — E109 Type 1 diabetes mellitus without complications: Secondary | ICD-10-CM

## 2019-04-14 MED ORDER — ONETOUCH VERIO VI STRP: ORAL_STRIP | 2 refills | Status: DC

## 2019-04-14 MED ORDER — TRAMADOL HCL 50 MG PO TABS: 50.0000 mg | ORAL_TABLET | Freq: Two times a day (BID) | ORAL | 0 refills | Status: DC | PRN

## 2019-04-14 MED ORDER — ONETOUCH VERIO W/DEVICE KIT: 1.0000 | PACK | Freq: Every day | 0 refills | Status: DC

## 2019-04-14 MED ORDER — ONETOUCH DELICA LANCETS 33G MISC: 1.0000 | Freq: Every day | 2 refills | Status: DC

## 2019-04-14 NOTE — Telephone Encounter (Signed)
IC advised done 

## 2019-04-14 NOTE — Telephone Encounter (Signed)
Can you please submit this for me electronically and then send it back to me so I can call the patient.

## 2019-04-18 DIAGNOSIS — J9500 Unspecified tracheostomy complication: Secondary | ICD-10-CM | POA: Diagnosis not present

## 2019-04-20 DIAGNOSIS — J9500 Unspecified tracheostomy complication: Secondary | ICD-10-CM | POA: Diagnosis not present

## 2019-04-21 DIAGNOSIS — J9500 Unspecified tracheostomy complication: Secondary | ICD-10-CM | POA: Diagnosis not present

## 2019-04-25 DIAGNOSIS — J9500 Unspecified tracheostomy complication: Secondary | ICD-10-CM | POA: Diagnosis not present

## 2019-04-26 NOTE — Pre-Procedure Instructions (Addendum)
[name]  04/26/2019      PHARMACARE AT [address], [place] [address] Phone: [phone] Fax: [phone]  [facility] [address] [place]), [place] - [address] [place] [address] Phone: [phone] Fax: [phone]    Your procedure is scheduled on May 03, 2019.  Report to [facility] Admitting at 845 AM.  Call this number if you have problems the morning of surgery:  [phone]   Remember:  Do not eat after midnight.  You may drink clear liquids until 745 AM .  Clear liquids allowed are:  G2 (Gatorade),  Water, Juice (non-citric and without pulp), Clear Tea and Black Coffee only    Take these medicines the morning of surgery with A SIP OF WATER  Amlodipine (Norvasc) Carvedilol (Coreg) Symbicort Inhaler Bupropion (Wellbutrin) Cyclobenzaprine (Flexeril) Gabapentin (neurontin) Tizanidine (zanaflex)-if needed Tramadol (ultram)-if needed Albuterol Inhaler-if needed Albuterol Nebulizer-if needed  Please bring rescue inhaler with you the day of surgery  7 days prior to surgery STOP taking any Aspirin (unless otherwise instructed by your surgeon), Excedrin Migraine, Aleve, Naproxen, Ibuprofen, Motrin, Advil, Goody's, BC's, all herbal medications, fish oil, and all vitamins   WHAT DO I DO ABOUT MY DIABETES MEDICATION?  Marland Kitchen The day of surgery, take 80 units of Tresiba insulin (1/2 of your normal dose) Do not take Ozempic the day of surgery  Reviewed and Endorsed by [facility] Patient Education Committee, August 2015  How to Manage Your Diabetes Before and After Surgery  Why is it important to control my blood sugar before and after surgery? . Improving blood sugar levels before and after surgery helps healing and can limit problems. . A way of improving blood sugar control is eating a healthy diet by: o  Eating less sugar and  carbohydrates o  Increasing activity/exercise o  Talking with your doctor about reaching your blood sugar goals . High blood sugars (greater than 180 mg/dL) can raise your risk of infections and slow your recovery, so you will need to focus on controlling your diabetes during the weeks before surgery. . Make sure that the doctor who takes care of your diabetes knows about your planned surgery including the date and location.  How do I manage my blood sugar before surgery? . Check your blood sugar at least 4 times a day, starting 2 days before surgery, to make sure that the level is not too high or low. o Check your blood sugar the morning of your surgery when you wake up and every 2 hours until you get to the Short Stay unit. . If your blood sugar is less than 70 mg/dL, you will need to treat for low blood sugar: o Do not take insulin. o Treat a low blood sugar (less than 70 mg/dL) with  cup of clear juice (cranberry or apple), 4 glucose tablets, OR glucose gel. Recheck blood sugar in 15 minutes after treatment (to make sure it is greater than 70 mg/dL). If your blood sugar is not greater than 70 mg/dL on recheck, call [phone] o  for further instructions. . Report your blood sugar to the short stay nurse when you get to Short Stay.  . If you are admitted to the hospital after surgery: o Your blood sugar will be checked by the staff and you will probably be given insulin after surgery (instead of oral diabetes medicines) to make  sure you have good blood sugar levels. o The goal for blood sugar control after surgery is 80-180 mg/dL.  [facility]- Preparing For Surgery  Before surgery, you can play an important role. Because skin is not sterile, your skin needs to be as free of germs as possible. You can reduce the number of germs on your skin by washing with CHG (chlorahexidine gluconate) Soap before surgery.  CHG is an antiseptic cleaner which kills germs and bonds with the skin to  continue killing germs even after washing.    Oral Hygiene is also important to reduce your risk of infection.  Remember - BRUSH YOUR TEETH THE MORNING OF SURGERY WITH YOUR REGULAR TOOTHPASTE  Please do not use if you have an allergy to CHG or antibacterial soaps. If your skin becomes reddened/irritated stop using the CHG.  Do not shave (including legs and underarms) for at least 48 hours prior to first CHG shower. It is OK to shave your face.  Please follow these instructions carefully.   1. Shower the NIGHT BEFORE SURGERY and the MORNING OF SURGERY with CHG.   2. If you chose to wash your hair, wash your hair first as usual with your normal shampoo.  3. After you shampoo, rinse your hair and body thoroughly to remove the shampoo.  4. Use CHG as you would any other liquid soap. You can apply CHG directly to the skin and wash gently with a scrungie or a clean washcloth.   5. Apply the CHG Soap to your body ONLY FROM THE NECK DOWN.  Do not use on open wounds or open sores. Avoid contact with your eyes, ears, mouth and genitals (private parts). Wash Face and genitals (private parts)  with your normal soap.  6. Wash thoroughly, paying special attention to the area where your surgery will be performed.  7. Thoroughly rinse your body with warm water from the neck down.  8. DO NOT shower/wash with your normal soap after using and rinsing off the CHG Soap.  9. Pat yourself dry with a CLEAN TOWEL.  10. Wear CLEAN PAJAMAS to bed the night before surgery, wear comfortable clothes the morning of surgery  11. Place CLEAN SHEETS on your bed the night of your first shower and DO NOT SLEEP WITH PETS  Day of Surgery: Shower as above Do not apply any deodorants/lotions.  Please wear clean clothes to the hospital/surgery center.   Remember to brush your teeth WITH YOUR REGULAR TOOTHPASTE.    Do not wear jewelry, make-up or nail polish.  Do not wear lotions, powders, or perfumes, or  deodorant.  Do not shave 48 hours prior to surgery.    Do not bring valuables to the hospital.   [facility] is not responsible for any belongings or valuables.  IF you are a smoker, DO NOT Smoke 24 hours prior to surgery   IF you wear a CPAP at night please bring your mask, tubing, and machine the morning of surgery    Remember that you must have someone to transport you home after your surgery, and remain with you for 24 hours if you are discharged the same day.  Contacts, dentures or bridgework may not be worn into surgery.  Leave your suitcase in the car.  After surgery it may be brought to your room.  For patients admitted to the hospital, discharge time will be determined by your treatment team.  Patients discharged the day of surgery will not be allowed to drive home.  Please read over the following fact sheets that you were given.

## 2019-04-26 NOTE — Progress Notes (Addendum)
PCP - [name], MD Cardiologist - pt denies Endocrinologist- Dr Einar Crow Endocrinology-last visit 04/12/2019 ENT at [facility] (manages Trach)- Carlis Abbott, MD  Chest x-ray - 02/2019 in [facility] EKG - 02/2019 in [facility]  Stress Test -  ECHO - 05/2017 in [facility]  Cardiac Cath - pt denies  Sleep Study - n/a CPAP - n/a  Fasting Blood Sugar - 100-200, improving since started on Ozempic Checks Blood Sugar 3 times a day  Blood Thinner Instructions: n/a Aspirin Instructions: n/a  Anesthesia review: A1C 03/22/2019 13.7, Dr. Loanne Drilling started her on Ozempic trach dependent (has received anesthesia at [facility] since this time, records in care everywhere) I discussed this with Karoline Caldwell PA-C and he will review chart    Patient denies shortness of breath, fever, cough and chest pain at PAT appointment  Patient verbalized understanding of instructions that were given to them at the PAT appointment. Patient was also instructed that they will need to review over the PAT instructions again at home before surgery.   Coronavirus Screening  Have you experienced the following symptoms:  Cough yes/no: No Fever (>100.76F)  yes/no: No Runny nose yes/no: No Sore throat yes/no: No Difficulty breathing/shortness of breath  yes/no: No  Have you or a family member traveled in the last 14 days and where? yes/no: No   If the patient indicates "YES" to the above questions, their PAT will be rescheduled to limit the exposure to others and, the surgeon will be notified. THE PATIENT WILL NEED TO BE ASYMPTOMATIC FOR 14 DAYS.   If the patient is not experiencing any of these symptoms, the PAT nurse will instruct them to NOT bring anyone with them to their appointment since they may have these symptoms or traveled as well.   Please remind your patients and families that hospital visitation restrictions are in effect and the importance of the restrictions.

## 2019-04-27 ENCOUNTER — Encounter (HOSPITAL_COMMUNITY)
Admission: RE | Admit: 2019-04-27 | Discharge: 2019-04-27 | Disposition: A | Discharge status: Home / Self Care | Payer: Medicare HMO | Source: Ambulatory Visit | Attending: Orthopedic Surgery | Admitting: Orthopedic Surgery

## 2019-04-27 ENCOUNTER — Encounter (HOSPITAL_COMMUNITY): Payer: Self-pay | Admitting: Physician Assistant

## 2019-04-27 ENCOUNTER — Other Ambulatory Visit: Payer: Self-pay

## 2019-04-27 ENCOUNTER — Telehealth: Payer: Self-pay | Admitting: Orthopedic Surgery

## 2019-04-27 DIAGNOSIS — Z794 Long term (current) use of insulin: Secondary | ICD-10-CM | POA: Diagnosis not present

## 2019-04-27 DIAGNOSIS — E119 Type 2 diabetes mellitus without complications: Secondary | ICD-10-CM | POA: Insufficient documentation

## 2019-04-27 DIAGNOSIS — J9500 Unspecified tracheostomy complication: Secondary | ICD-10-CM | POA: Diagnosis not present

## 2019-04-27 DIAGNOSIS — Z01812 Encounter for preprocedural laboratory examination: Secondary | ICD-10-CM | POA: Insufficient documentation

## 2019-04-27 LAB — CBC
HCT: 38.3 % (ref 36.0–46.0)
Hemoglobin: 12.2 g/dL (ref 12.0–15.0)
MCH: 28.5 pg (ref 26.0–34.0)
MCHC: 31.9 g/dL (ref 30.0–36.0)
MCV: 89.5 fL (ref 80.0–100.0)
Platelets: 342 10*3/uL (ref 150–400)
RBC: 4.28 MIL/uL (ref 3.87–5.11)
RDW: 13.3 % (ref 11.5–15.5)
WBC: 7.9 10*3/uL (ref 4.0–10.5)
nRBC: 0 % (ref 0.0–0.2)

## 2019-04-27 LAB — BASIC METABOLIC PANEL
Anion gap: 8 (ref 5–15)
BUN: 19 mg/dL (ref 6–20)
CO2: 23 mmol/L (ref 22–32)
Calcium: 8.9 mg/dL (ref 8.9–10.3)
Chloride: 107 mmol/L (ref 98–111)
Creatinine, Ser: 1.11 mg/dL — ABNORMAL HIGH (ref 0.44–1.00)
GFR calc Af Amer: >60 mL/min (ref >60)
GFR calc non Af Amer: 58 mL/min — ABNORMAL LOW (ref >60)
Glucose, Bld: 138 mg/dL — ABNORMAL HIGH (ref 70–99)
Potassium: 4.1 mmol/L (ref 3.5–5.1)
Sodium: 138 mmol/L (ref 135–145)

## 2019-04-27 LAB — GLUCOSE, CAPILLARY: Glucose-Capillary: 139 mg/dL — ABNORMAL HIGH (ref 70–99)

## 2019-04-27 LAB — SURGICAL PCR SCREEN
MRSA, PCR: NEGATIVE
Staphylococcus aureus: NEGATIVE

## 2019-04-27 NOTE — Telephone Encounter (Signed)
[name] with [facility] anesthesia short stay calling with a lab FYI:   Patient is on for August 11th.  Patient was hospitalized end of June with  Asthma exacerbation and mild DKA. Lab at that time showed A1c of 13.7 and her blood sugar 425. Since then she has been seen by her endocrinologist on the 5th and was put on medication. He says patient is aware that her surgery could be cancelled if her A1c is too high.

## 2019-04-27 NOTE — Telephone Encounter (Signed)
Dr Marlou Sa said that A1C too high. Needs 8 or below.

## 2019-04-27 NOTE — Anesthesia Preprocedure Evaluation (Deleted)
Anesthesia Evaluation    Airway        Dental   Pulmonary former smoker,           Cardiovascular hypertension,      Neuro/Psych    GI/Hepatic   Endo/Other  diabetes  Renal/GU      Musculoskeletal   Abdominal   Peds  Hematology   Anesthesia Other Findings   Reproductive/Obstetrics                             Anesthesia Physical Anesthesia Plan  ASA:   Anesthesia Plan:    Post-op Pain Management:    Induction:   PONV Risk Score and Plan:   Airway Management Planned:   Additional Equipment:   Intra-op Plan:   Post-operative Plan:   Informed Consent:   Plan Discussed with:   Anesthesia Plan Comments: (Pt trach depended since 2018 secondary to 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 She is seen q3 months by ENT at [facility] for management.  Recent admission to [facility] 6/30-03/26/19 for asthma exacerbation. She also had hyperglycemia/mild DKA (A1c on 6/30 was 13.7). She initially presented with chest pain - troponins were normal, labs revealed leukocytosis, EKG unremarkable, CT angiogram of the chest revealed no evidence of PE, presence of cardiomegaly and hepatic steatosis. She was treated for hyperglycemia and acute asthma exacerbation, SARS-CoV-2 was negative.  Since discharge she has been seen by her endocrinologist, Ozempic was added and she reports significant improvement in BG control. Seen by her PCP on 7/6 for post hospital followup, denied wheezing or SOB at that time, cough was improving.   At PAT she denied any SOB, denied any issues with her trach. Overall feeling well. Dr. Marlou Sa notified of pt's recent A1c.)        Anesthesia Quick Evaluation

## 2019-04-28 ENCOUNTER — Telehealth: Payer: Self-pay | Admitting: Orthopedic Surgery

## 2019-04-28 DIAGNOSIS — J9500 Unspecified tracheostomy complication: Secondary | ICD-10-CM | POA: Diagnosis not present

## 2019-04-28 DIAGNOSIS — G4733 Obstructive sleep apnea (adult) (pediatric): Secondary | ICD-10-CM | POA: Diagnosis not present

## 2019-04-28 DIAGNOSIS — J9601 Acute respiratory failure with hypoxia: Secondary | ICD-10-CM | POA: Diagnosis not present

## 2019-04-28 DIAGNOSIS — J45909 Unspecified asthma, uncomplicated: Secondary | ICD-10-CM | POA: Diagnosis not present

## 2019-04-28 DIAGNOSIS — Z930 Tracheostomy status: Secondary | ICD-10-CM | POA: Diagnosis not present

## 2019-04-28 DIAGNOSIS — J449 Chronic obstructive pulmonary disease, unspecified: Secondary | ICD-10-CM | POA: Diagnosis not present

## 2019-04-28 NOTE — Telephone Encounter (Signed)
Patient is canceling  August 11th surgery with Dr. Marlou Sa and will reschedule. She has an appointment with Dr. Lissa Merlin on 05-17-19. She is working towards getting her A1c below 8.

## 2019-04-28 NOTE — Telephone Encounter (Signed)
fyi

## 2019-04-28 NOTE — Telephone Encounter (Signed)
Ok thx pls send to [employer] [facility]

## 2019-04-29 ENCOUNTER — Other Ambulatory Visit (HOSPITAL_COMMUNITY): Payer: Medicare HMO

## 2019-05-02 DIAGNOSIS — J9500 Unspecified tracheostomy complication: Secondary | ICD-10-CM | POA: Diagnosis not present

## 2019-05-03 ENCOUNTER — Ambulatory Visit: Admission: EM | Admit: 2019-05-03 | Payer: Medicare HMO | Source: Home / Self Care | Admitting: Orthopedic Surgery

## 2019-05-03 ENCOUNTER — Encounter: Admission: EM | Payer: Self-pay | Source: Home / Self Care

## 2019-05-03 SURGERY — SHOULDER ARTHROSCOPY WITH OPEN ROTATOR CUFF REPAIR AND DISTAL CLAVICLE ACROMINECTOMY
Anesthesia: General | Laterality: Right

## 2019-05-04 DIAGNOSIS — J9500 Unspecified tracheostomy complication: Secondary | ICD-10-CM | POA: Diagnosis not present

## 2019-05-05 DIAGNOSIS — J9500 Unspecified tracheostomy complication: Secondary | ICD-10-CM | POA: Diagnosis not present

## 2019-05-09 DIAGNOSIS — J398 Other specified diseases of upper respiratory tract: Secondary | ICD-10-CM | POA: Diagnosis not present

## 2019-05-09 DIAGNOSIS — Z87891 Personal history of nicotine dependence: Secondary | ICD-10-CM | POA: Diagnosis not present

## 2019-05-09 DIAGNOSIS — J384 Edema of larynx: Secondary | ICD-10-CM | POA: Diagnosis not present

## 2019-05-09 DIAGNOSIS — J45909 Unspecified asthma, uncomplicated: Secondary | ICD-10-CM | POA: Diagnosis not present

## 2019-05-09 DIAGNOSIS — R69 Illness, unspecified: Secondary | ICD-10-CM | POA: Diagnosis not present

## 2019-05-09 DIAGNOSIS — J386 Stenosis of larynx: Secondary | ICD-10-CM | POA: Diagnosis not present

## 2019-05-09 DIAGNOSIS — Z930 Tracheostomy status: Secondary | ICD-10-CM | POA: Diagnosis not present

## 2019-05-09 DIAGNOSIS — Z430 Encounter for attention to tracheostomy: Secondary | ICD-10-CM | POA: Diagnosis not present

## 2019-05-11 ENCOUNTER — Inpatient Hospital Stay: Payer: Medicare HMO | Admitting: Orthopedic Surgery

## 2019-05-13 ENCOUNTER — Other Ambulatory Visit: Payer: Self-pay

## 2019-05-16 DIAGNOSIS — J9500 Unspecified tracheostomy complication: Secondary | ICD-10-CM | POA: Diagnosis not present

## 2019-05-17 ENCOUNTER — Ambulatory Visit (INDEPENDENT_AMBULATORY_CARE_PROVIDER_SITE_OTHER): Payer: Medicare HMO | Admitting: Endocrinology

## 2019-05-17 ENCOUNTER — Encounter: Payer: Self-pay | Admitting: Endocrinology

## 2019-05-17 ENCOUNTER — Other Ambulatory Visit: Payer: Self-pay

## 2019-05-17 VITALS — BP 124/86 | HR 94 | Ht 61.0 in | Wt 252.0 lb

## 2019-05-17 DIAGNOSIS — R609 Edema, unspecified: Secondary | ICD-10-CM

## 2019-05-17 DIAGNOSIS — R110 Nausea: Secondary | ICD-10-CM

## 2019-05-17 DIAGNOSIS — E109 Type 1 diabetes mellitus without complications: Secondary | ICD-10-CM | POA: Diagnosis not present

## 2019-05-17 DIAGNOSIS — E1042 Type 1 diabetes mellitus with diabetic polyneuropathy: Secondary | ICD-10-CM | POA: Diagnosis not present

## 2019-05-17 LAB — POCT GLYCOSYLATED HEMOGLOBIN (HGB A1C): Hemoglobin A1C: 10.1 % — AB (ref 4.0–5.6)

## 2019-05-17 MED ORDER — OZEMPIC (0.25 OR 0.5 MG/DOSE) 2 MG/1.5ML ~~LOC~~ SOPN: 0.5000 mg | PEN_INJECTOR | SUBCUTANEOUS | 11 refills | Status: DC

## 2019-05-17 NOTE — Progress Notes (Signed)
Subjective:    Patient ID: [name], female    DOB: 12/10/1970, 48 y.o.   MRN: [id number]  HPI Pt returns for f/u of diabetes mellitus: DM type: 1 Dx'ed: 3419 Complications: renal insuff and painful polyneuropathy.  Therapy: insulin since 2012, and Ozempic GDM: never DKA: once, in 2020 Severe hypoglycemia: never.  Pancreatitis: never.   Other: she declines weight loss surgery; edema precludes pioglitizone rx; she did not tolerate metformin (diarrhea); she is not a candidate for multiple daily injections, due to missing insulin doses.    Interval history: Pt says she seldom misses the insulin.  Meter is downloaded today, and the printout is scanned into the record.  cbg varies from 119-221.  It is in general higher as the day goes on, but not necessarily so.   Past Medical History:  Diagnosis Date  . Arthritis   . Asthma   . Chronic back pain   . Chronic chest pain   . Depression   . DM (diabetes mellitus) ([place])    INSULIN DEPENDENT  . GERD (gastroesophageal reflux disease)   . Headache(784.0)   . Hypertension   . Hypokalemia   . Respiratory disease 05/2017  . Tracheostomy in place [facility]) 02/2017    Past Surgical History:  Procedure Laterality Date  . APPENDECTOMY    . CESAREAN SECTION     x 3  . CHOLECYSTECTOMY N/A 03/02/2014   Procedure: LAPAROSCOPIC CHOLECYSTECTOMY;  Surgeon: Joyice Faster. Cornett, MD;  Location: [place];  Service: General;  Laterality: N/A;  . HERNIA REPAIR    . PANENDOSCOPY N/A 03/04/2017   Procedure: PANENDOSCOPY WITH POSSIBLE FOREIGN BODY REMOVAL;  Surgeon: Jodi Marble, MD;  Location: [place];  Service: ENT;  Laterality: N/A;  . ROTATOR CUFF REPAIR    . TRACHEOSTOMY  02/2017  . VESICOVAGINAL FISTULA CLOSURE W/ TAH  2009    Social History   Socioeconomic History  . Marital status: Married    Spouse name: Not on file  . Number of children: Not on file  . Years of education: Not on file  . Highest education level: Not on file   Occupational History  . Not on file  Social Needs  . Financial resource strain: Not on file  . Food insecurity    Worry: Not on file    Inability: Not on file  . Transportation needs    Medical: Not on file    Non-medical: Not on file  Tobacco Use  . Smoking status: Former Smoker    Packs/day: 0.25    Years: 22.00    Pack years: 5.50    Types: Cigarettes    Quit date: 01/20/2017    Years since quitting: 2.3  . Smokeless tobacco: Never Used  Substance and Sexual Activity  . Alcohol use: No    Alcohol/week: 1.0 - 2.0 standard drinks    Types: 1 - 2 Glasses of wine per week    Comment: socially  . Drug use: No  . Sexual activity: Yes    Partners: Male    Birth control/protection: None  Lifestyle  . Physical activity    Days per week: Not on file    Minutes per session: Not on file  . Stress: Not on file  Relationships  . Social Herbalist on phone: Not on file    Gets together: Not on file    Attends religious service: Not on file    Active member of club or organization: Not on  file    Attends meetings of clubs or organizations: Not on file    Relationship status: Not on file  . Intimate partner violence    Fear of current or ex partner: Not on file    Emotionally abused: Not on file    Physically abused: Not on file    Forced sexual activity: Not on file  Other Topics Concern  . Not on file  Social History Narrative  . Not on file    Current Outpatient Medications on File Prior to Visit  Medication Sig Dispense Refill  . albuterol (PROAIR HFA) 108 (90 Base) MCG/ACT inhaler Inhale 2 puffs into the lungs every 6 (six) hours as needed for wheezing or shortness of breath. 1 Inhaler 3  . amLODipine (NORVASC) 10 MG tablet Take 1 tablet (10 mg total) by mouth daily. 90 tablet 1  . aspirin-acetaminophen-caffeine (EXCEDRIN MIGRAINE) 250-250-65 MG tablet Take 2 tablets by mouth every 6 (six) hours as needed for headache.    Marland Kitchen atorvastatin (LIPITOR) 20 MG tablet  Take 1 tablet (20 mg total) by mouth daily. 90 tablet 1  . Blood Glucose Monitoring Suppl (ONETOUCH VERIO) w/Device KIT 1 each by Does not apply route daily. Use to monitor glucose levels daily; E11.9 1 kit 0  . budesonide-formoterol (SYMBICORT) 160-4.5 MCG/ACT inhaler Inhale 2 puffs into the lungs 2 (two) times daily. 3 Inhaler 1  . buPROPion (WELLBUTRIN SR) 150 MG 12 hr tablet Take 1 tablet (150 mg total) by mouth 2 (two) times daily. 60 tablet 3  . carvedilol (COREG) 25 MG tablet Take 1 tablet (25 mg total) by mouth 2 (two) times daily with a meal. 180 tablet 1  . cyclobenzaprine (FLEXERIL) 10 MG tablet Take 1 tablet (10 mg total) by mouth 3 (three) times daily as needed for muscle spasms. 30 tablet 0  . furosemide (LASIX) 20 MG tablet Take 1 tablet (20 mg total) by mouth daily. 30 tablet 2  . gabapentin (NEURONTIN) 300 MG capsule Take 2 capsules (600 mg total) by mouth 2 (two) times daily. 360 capsule 1  . glucose blood (ONETOUCH VERIO) test strip Use to monitor glucose levels daily; E11.9 100 each 2  . hydrOXYzine (ATARAX/VISTARIL) 25 MG tablet Take 1 tablet (25 mg total) by mouth 3 (three) times daily as needed for anxiety. 60 tablet 1  . ibuprofen (ADVIL,MOTRIN) 600 MG tablet Take 1 tablet (600 mg total) by mouth every 8 (eight) hours as needed. (Patient taking differently: Take 600 mg by mouth every 8 (eight) hours as needed for mild pain. ) 30 tablet 0  . Insulin Degludec (TRESIBA FLEXTOUCH) 200 UNIT/ML SOPN Inject 160 Units into the skin daily. 12 pen 11  . Insulin Pen Needle (FIFTY50 PEN NEEDLES) 31G X 8 MM MISC Inject 1 Units as directed daily.    Marland Kitchen lisinopril (ZESTRIL) 10 MG tablet Take 1 tablet (10 mg total) by mouth daily. 90 tablet 1  . Melatonin 3 MG TABS Take 3 mg by mouth at bedtime as needed (for sleep).     . montelukast (SINGULAIR) 10 MG tablet Take 1 tablet (10 mg total) by mouth at bedtime. 90 tablet 1  . naproxen (NAPROSYN) 500 MG tablet Take 1 tablet (500 mg total) by mouth  2 (two) times daily with a meal. 60 tablet 0  . OneTouch Delica Lancets 37D MISC 1 each by Does not apply route daily. Use to monitor glucose levels daily; E11.9 100 each 2  . sertraline (ZOLOFT) 100 MG tablet Take  1 tablet (100 mg total) by mouth at bedtime. 90 tablet 1  . SUMAtriptan (IMITREX) 100 MG tablet At the onset of a migraine; may repeat in 2 hours. Max daily dose 267m 20 tablet 1  . tiZANidine (ZANAFLEX) 4 MG tablet Take 1 tablet (4 mg total) by mouth every 8 (eight) hours as needed for muscle spasms. 90 tablet 3  . traMADol (ULTRAM) 50 MG tablet Take 1 tablet (50 mg total) by mouth every 12 (twelve) hours as needed. 30 tablet 0  . [DISCONTINUED] fluticasone-salmeterol (ADVAIR HFA) 230-21 MCG/ACT inhaler Inhale 2 puffs into the lungs 2 (two) times daily. 1 Inhaler 3   No current facility-administered medications on file prior to visit.     Allergies  Allergen Reactions  . Reglan [Metoclopramide] Other (See Comments)    Panic attack    Family History  Problem Relation Age of Onset  . Heart attack Mother   . Stroke Mother   . Diabetes Mother   . Hypertension Mother   . Arthritis Mother   . Stroke Father   . Hypertension Sister   . Diabetes Sister   . Seizures Brother   . Diabetes Brother     BP 124/86 (BP Location: Right Wrist, Patient Position: Sitting, Cuff Size: Large)   Pulse 94   Ht _0  (1.549 m)   Wt 252 lb (114.3 kg)   SpO2 98%   BMI 47.61 kg/m    Review of Systems She denies hypoglycemia.  She has mild nausea.     Objective:   Physical Exam VITAL SIGNS:  See vs page GENERAL: no distress Pulses: dorsalis pedis intact bilat.   MSK: no deformity of the feet CV: trace bilat leg edema Skin:  no ulcer on the feet.  normal color and temp on the feet. Neuro: sensation is intact to touch on the feet Ext: there is bilateral onychomycosis of the toenails.    Lab Results  Component Value Date   HGBA1C 10.1 (A) 05/17/2019   Lab Results  Component  Value Date   CREATININE 1.11 (H) 04/27/2019   BUN 19 04/27/2019   NA 138 04/27/2019   K 4.1 04/27/2019   CL 107 04/27/2019   CO2 23 04/27/2019        Assessment & Plan:  Type 1 DM, with PN: she needs increased rx Nausea: poss due to Ozempic Edema: This limits rx options   Patient Instructions  I have sent a prescription to your pharmacy, to increase the Ozempic. Please call if this makes nausea worse.   Please continue the same [place]  If you miss this, you can make it up when you can.   please come back for a follow-up appointment in 1-2 months.   check your blood sugar twice a day.  vary the time of day when you check, between before the 3 meals, and at bedtime.  also check if you have symptoms of your blood sugar being too high or too low.  please keep a record of the readings and bring it to your next appointment here (or you can bring the meter itself).  You can write it on any piece of paper.  please call [email or web address] if your blood sugar goes below 70, or if you have a lot of readings over 200.

## 2019-05-17 NOTE — Patient Instructions (Addendum)
I have sent a prescription to your pharmacy, to increase the Ozempic. Please call if this makes nausea worse.   Please continue the same [place].  If you miss this, you can make it up when you can.   please come back for a follow-up appointment in 1-2 months.   check your blood sugar twice a day.  vary the time of day when you check, between before the 3 meals, and at bedtime.  also check if you have symptoms of your blood sugar being too high or too low.  please keep a record of the readings and bring it to your next appointment here (or you can bring the meter itself).  You can write it on any piece of paper.  please call us sooner if your blood sugar goes below 70, or if you have a lot of readings over 200.

## 2019-05-18 DIAGNOSIS — J9500 Unspecified tracheostomy complication: Secondary | ICD-10-CM | POA: Diagnosis not present

## 2019-05-19 ENCOUNTER — Other Ambulatory Visit: Payer: Self-pay | Admitting: Pharmacist

## 2019-05-19 DIAGNOSIS — J4530 Mild persistent asthma, uncomplicated: Secondary | ICD-10-CM

## 2019-05-19 DIAGNOSIS — J9500 Unspecified tracheostomy complication: Secondary | ICD-10-CM | POA: Diagnosis not present

## 2019-05-19 MED ORDER — ALBUTEROL SULFATE (2.5 MG/3ML) 0.083% IN NEBU: 2.5000 mg | INHALATION_SOLUTION | Freq: Four times a day (QID) | RESPIRATORY_TRACT | 3 refills | Status: DC | PRN

## 2019-05-23 DIAGNOSIS — J9500 Unspecified tracheostomy complication: Secondary | ICD-10-CM | POA: Diagnosis not present

## 2019-05-23 IMAGING — DX DG CHEST 1V PORT
1 series · 1 of 1 positions shown · non-contrast
Comparison: 03/05/2018

CLINICAL DATA: Shortness of breath and cough

EXAM:
PORTABLE CHEST 1 VIEW

[chest ap]
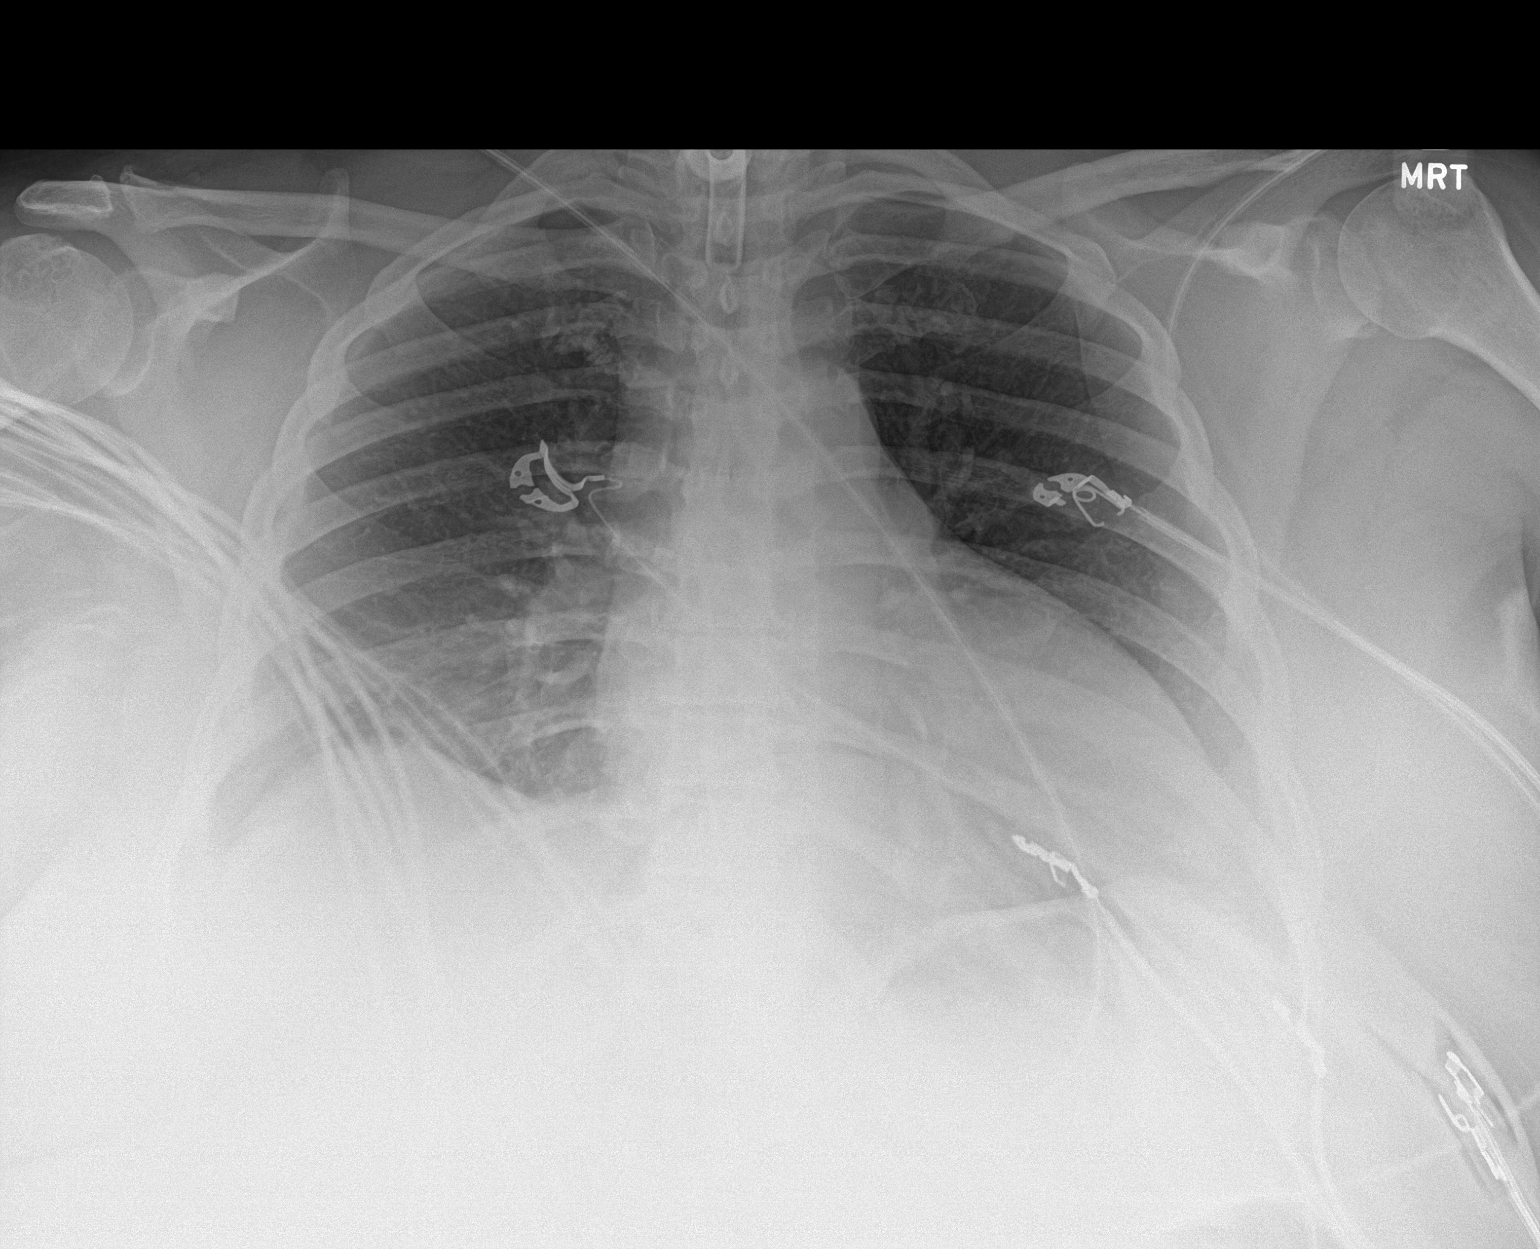

[1 of 1 positions shown; findings below may reference images not displayed]

FINDINGS: Tracheostomy tube is in place. Mild cardiomegaly. Hazy right
infrahilar atelectasis or developing infiltrate. No pneumothorax.
Probable small right effusion.
IMPRESSION: 1. Hazy right infrahilar atelectasis or developing infiltrate.
Probable small right effusion.
2. Mild cardiomegaly

## 2019-05-23 IMAGING — CT CT ANGIO CHEST
2 of 6 series · 19 of 36 positions shown · IV contrast (iopamidol)
Comparison: 03/05/2018

CLINICAL DATA: Shortness of breath. Bloody mucus via trach. Cough.

EXAM:
CT ANGIOGRAPHY CHEST WITH CONTRAST
TECHNIQUE: Multidetector CT imaging of the chest was performed using the
standard protocol during bolus administration of intravenous
contrast. Multiplanar CT image reconstructions and MIPs were
obtained to evaluate the vascular anatomy.
CONTRAST:  100mL [id number] IOPAMIDOL ([id number]) INJECTION 76%

[Series 8: pe thins · axial · 0.97mm/px · z∈[+263,+464]mm · 18 of 320 slices shown]
[im 17/320  lung]
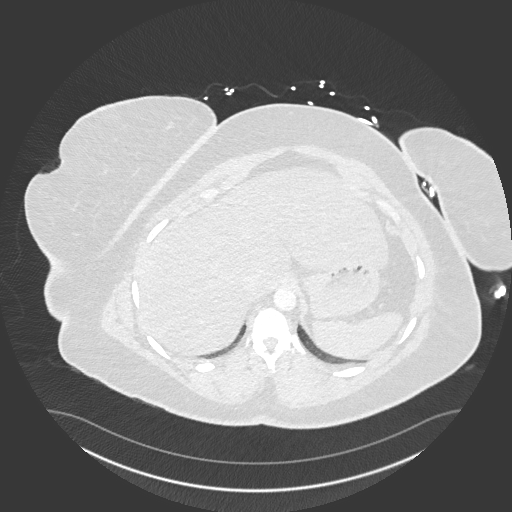
[im 34/320  mediastinal]
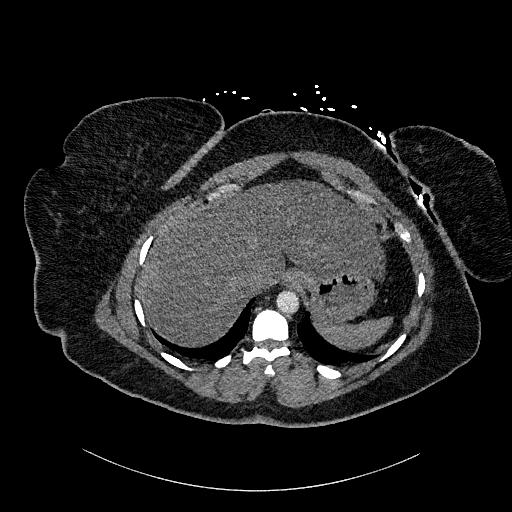
[im 51/320  lung]
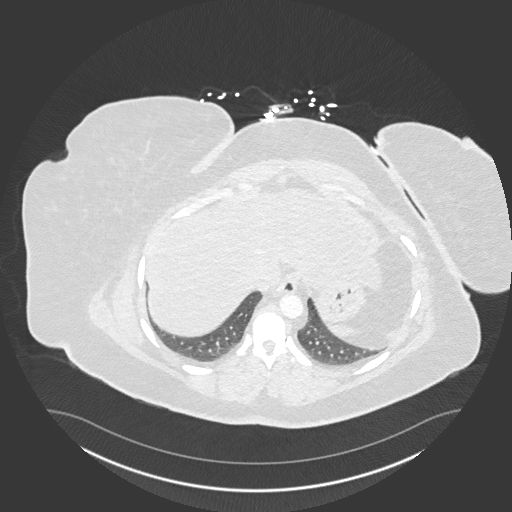
[im 68/320  mediastinal]
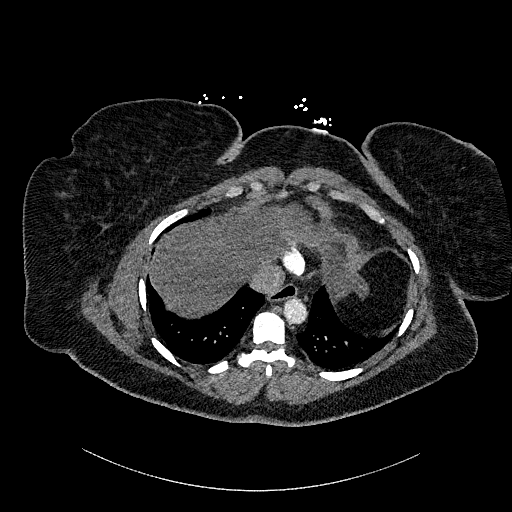
[im 84/320  lung]
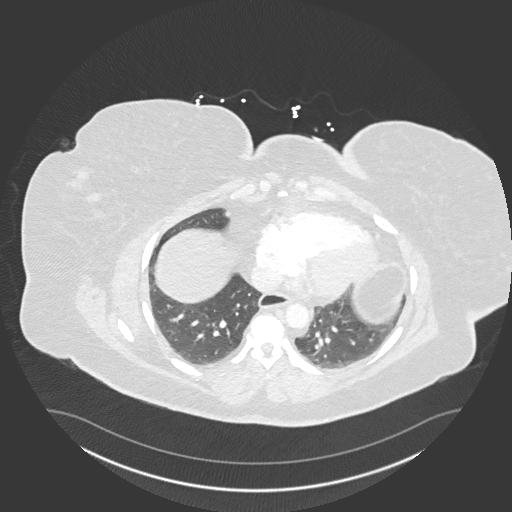
[im 101/320  mediastinal]
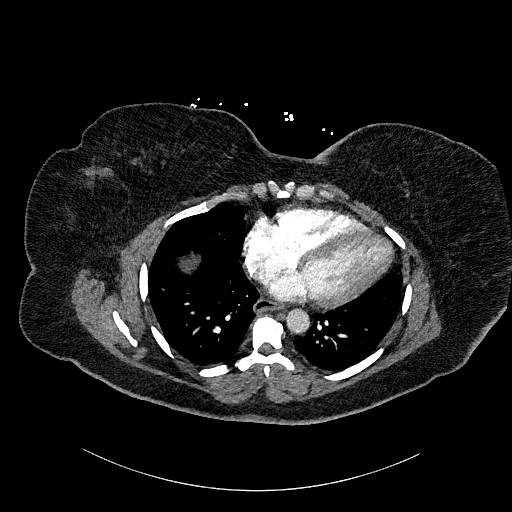
[im 118/320  lung]
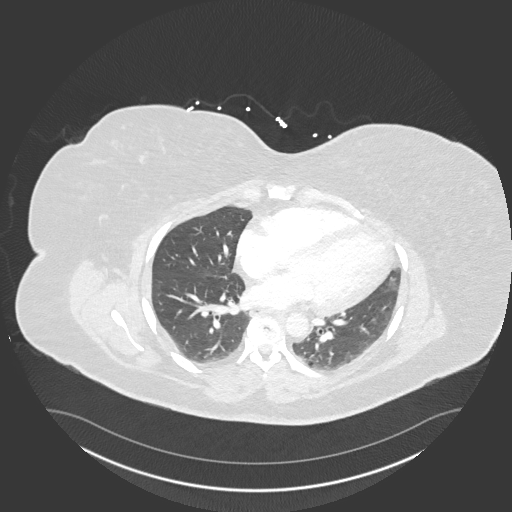
[im 135/320  mediastinal]
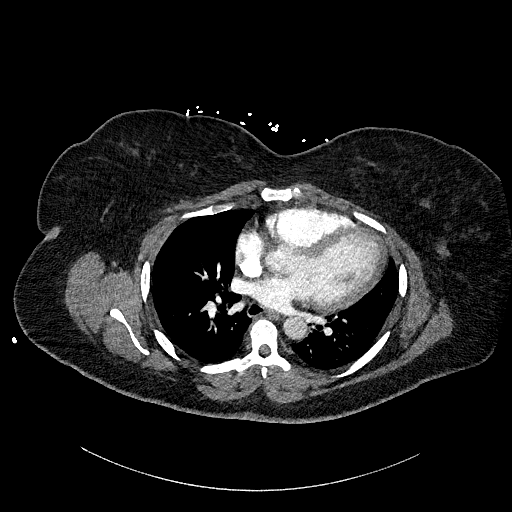
[im 152/320  lung]
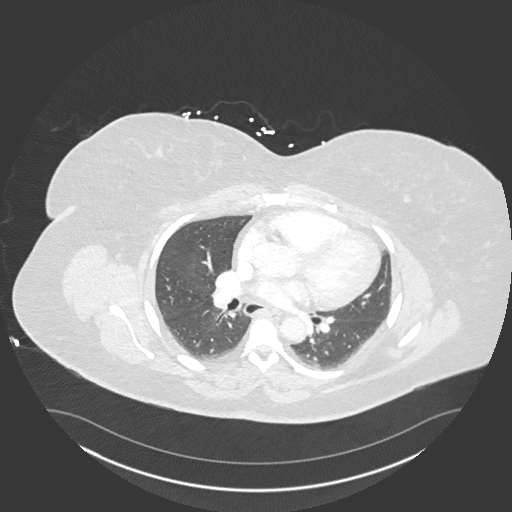
[im 168/320  mediastinal]
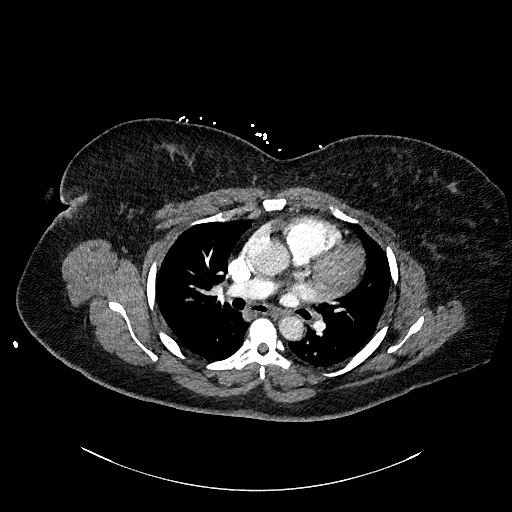
[im 185/320  lung]
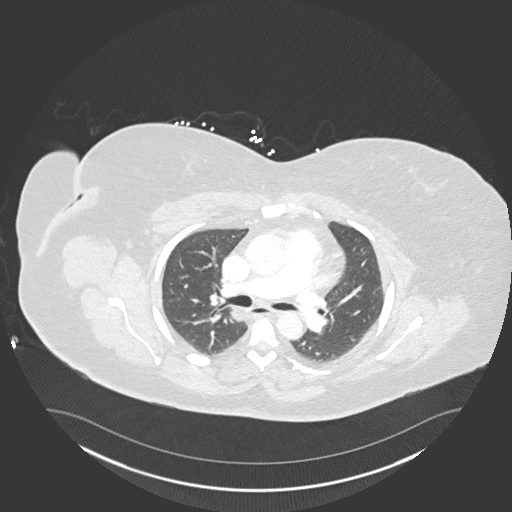
[im 202/320  mediastinal]
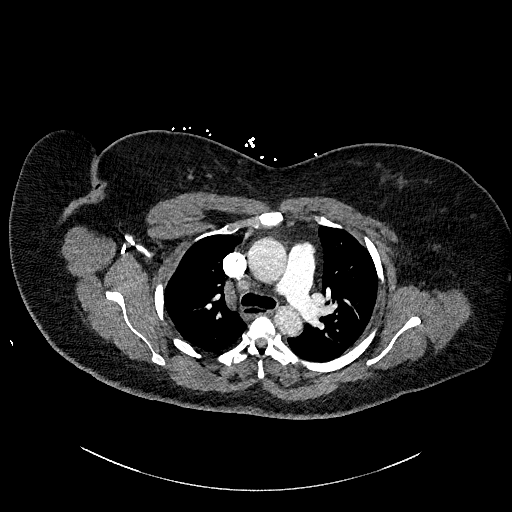
[im 219/320  lung]
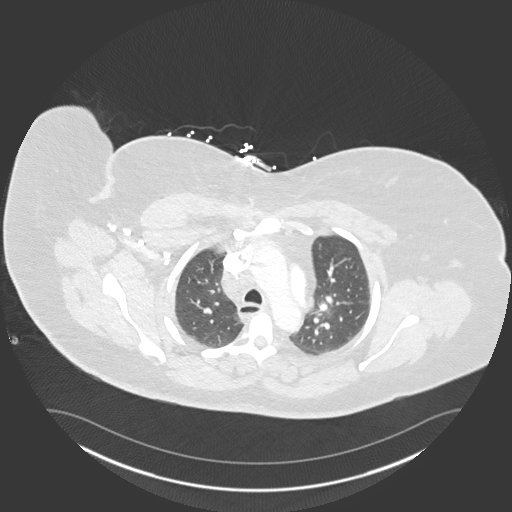
[im 236/320  mediastinal]
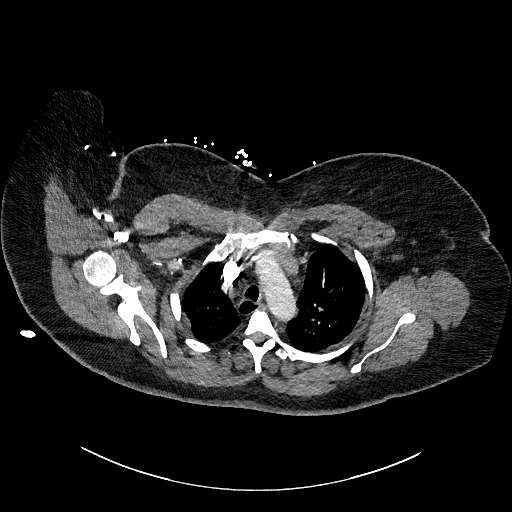
[im 252/320  lung]
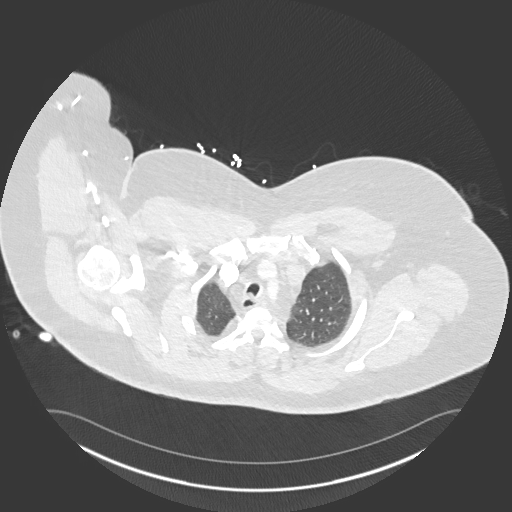
[im 269/320  mediastinal]
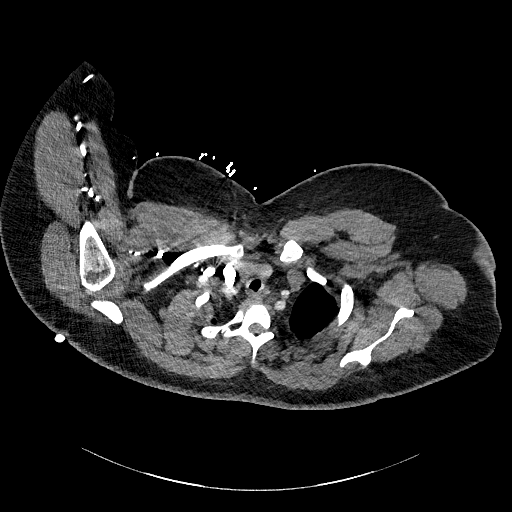
[im 286/320  lung]
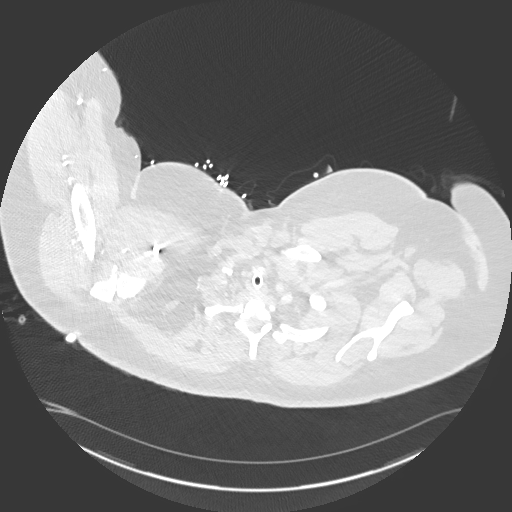
[im 303/320  mediastinal]
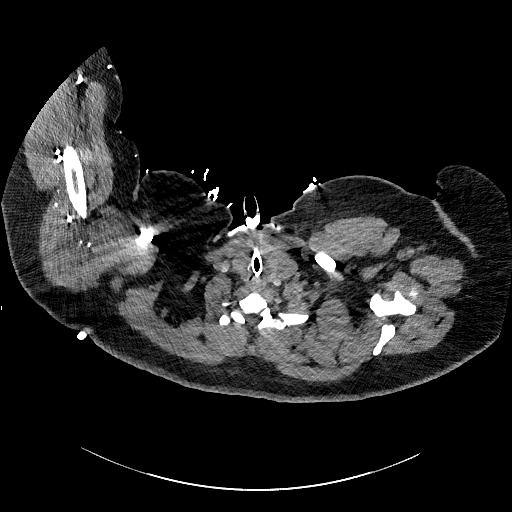

[Series 9: pe 2mm cor · coronal · 0.51mm/px · 1 of 151 slices shown]
[im 76/151  mediastinal]
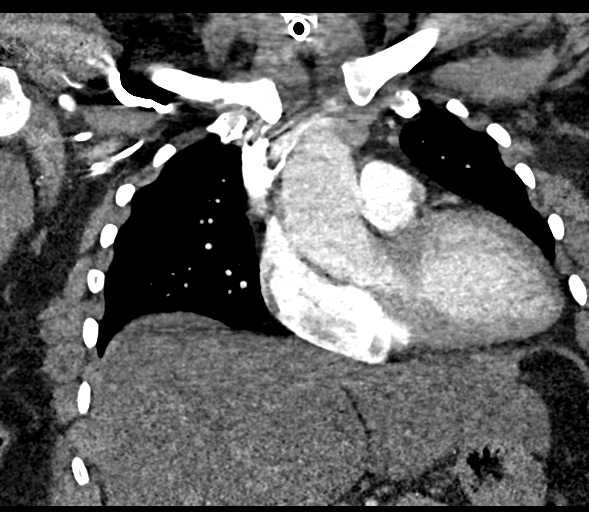

[19 of 36 positions shown; findings below may reference images not displayed]

FINDINGS: Cardiovascular: Heart is enlarged. No substantial pericardial
effusion. Ascending thoracic aorta measures 3.9 cm maximum
dimension. The no filling defect identified in the opacified
pulmonary arteries to suggest the presence of an acute pulmonary
embolus.

Mediastinum/Nodes: 1.8 cm short axis right paratracheal lymph node
is stable in the interval. Upper normal lymph nodes are seen in the
right hilum. No left hilar lymphadenopathy. The esophagus has normal
imaging features. There is no axillary lymphadenopathy.

Lungs/Pleura: Tracheostomy tube again noted. Tip is positioned in
the proximal to mid trachea. No focal airspace consolidation. No
suspicious nodule or mass. No pleural effusion.

Upper Abdomen: The liver shows diffusely decreased attenuation
suggesting steatosis. Otherwise unremarkable.

Musculoskeletal: No worrisome lytic or sclerotic osseous
abnormality.

Review of the MIP images confirms the above findings.
IMPRESSION: 1. No CT evidence for acute pulmonary embolus.
2. Stable cardiomegaly.
3. Hepatic steatosis

## 2019-05-25 DIAGNOSIS — J9500 Unspecified tracheostomy complication: Secondary | ICD-10-CM | POA: Diagnosis not present

## 2019-05-26 DIAGNOSIS — J9500 Unspecified tracheostomy complication: Secondary | ICD-10-CM | POA: Diagnosis not present

## 2019-05-30 DIAGNOSIS — J9500 Unspecified tracheostomy complication: Secondary | ICD-10-CM | POA: Diagnosis not present

## 2019-06-01 DIAGNOSIS — J45909 Unspecified asthma, uncomplicated: Secondary | ICD-10-CM | POA: Diagnosis not present

## 2019-06-01 DIAGNOSIS — J449 Chronic obstructive pulmonary disease, unspecified: Secondary | ICD-10-CM | POA: Diagnosis not present

## 2019-06-01 DIAGNOSIS — J9500 Unspecified tracheostomy complication: Secondary | ICD-10-CM | POA: Diagnosis not present

## 2019-06-01 DIAGNOSIS — Z930 Tracheostomy status: Secondary | ICD-10-CM | POA: Diagnosis not present

## 2019-06-01 DIAGNOSIS — J9601 Acute respiratory failure with hypoxia: Secondary | ICD-10-CM | POA: Diagnosis not present

## 2019-06-01 DIAGNOSIS — G4733 Obstructive sleep apnea (adult) (pediatric): Secondary | ICD-10-CM | POA: Diagnosis not present

## 2019-06-02 DIAGNOSIS — J9500 Unspecified tracheostomy complication: Secondary | ICD-10-CM | POA: Diagnosis not present

## 2019-06-06 DIAGNOSIS — J9500 Unspecified tracheostomy complication: Secondary | ICD-10-CM | POA: Diagnosis not present

## 2019-06-08 ENCOUNTER — Telehealth: Payer: Self-pay | Admitting: Orthopedic Surgery

## 2019-06-08 DIAGNOSIS — J9500 Unspecified tracheostomy complication: Secondary | ICD-10-CM | POA: Diagnosis not present

## 2019-06-08 NOTE — Telephone Encounter (Signed)
IC s/w patient and advised  

## 2019-06-08 NOTE — Telephone Encounter (Signed)
Please advise. Do you want her to have an OV?

## 2019-06-08 NOTE — Telephone Encounter (Signed)
You can bring her in in a week or 2 but nothing really to do until her sugar gets under control.  Could consider sending her to therapy.

## 2019-06-08 NOTE — Telephone Encounter (Signed)
Patient called stating that she is having a lot trouble with her arm to where she can barley use it.  CB#[phone].  Thank you.

## 2019-06-09 DIAGNOSIS — J9500 Unspecified tracheostomy complication: Secondary | ICD-10-CM | POA: Diagnosis not present

## 2019-06-13 DIAGNOSIS — J9500 Unspecified tracheostomy complication: Secondary | ICD-10-CM | POA: Diagnosis not present

## 2019-06-15 DIAGNOSIS — J9500 Unspecified tracheostomy complication: Secondary | ICD-10-CM | POA: Diagnosis not present

## 2019-06-16 DIAGNOSIS — J9500 Unspecified tracheostomy complication: Secondary | ICD-10-CM | POA: Diagnosis not present
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
## 2019-06-20 DIAGNOSIS — J9500 Unspecified tracheostomy complication: Secondary | ICD-10-CM | POA: Diagnosis not present

## 2019-06-22 DIAGNOSIS — J9500 Unspecified tracheostomy complication: Secondary | ICD-10-CM | POA: Diagnosis not present

## 2019-06-23 DIAGNOSIS — J9500 Unspecified tracheostomy complication: Secondary | ICD-10-CM | POA: Diagnosis not present

## 2019-06-27 DIAGNOSIS — J9500 Unspecified tracheostomy complication: Secondary | ICD-10-CM | POA: Diagnosis not present

## 2019-06-29 DIAGNOSIS — J9500 Unspecified tracheostomy complication: Secondary | ICD-10-CM | POA: Diagnosis not present

## 2019-06-30 ENCOUNTER — Other Ambulatory Visit: Payer: Self-pay

## 2019-06-30 DIAGNOSIS — G4733 Obstructive sleep apnea (adult) (pediatric): Secondary | ICD-10-CM | POA: Diagnosis not present

## 2019-06-30 DIAGNOSIS — J9500 Unspecified tracheostomy complication: Secondary | ICD-10-CM | POA: Diagnosis not present

## 2019-06-30 DIAGNOSIS — Z930 Tracheostomy status: Secondary | ICD-10-CM | POA: Diagnosis not present

## 2019-06-30 DIAGNOSIS — J449 Chronic obstructive pulmonary disease, unspecified: Secondary | ICD-10-CM | POA: Diagnosis not present

## 2019-06-30 DIAGNOSIS — J45909 Unspecified asthma, uncomplicated: Secondary | ICD-10-CM | POA: Diagnosis not present

## 2019-07-04 ENCOUNTER — Ambulatory Visit: Payer: Medicare HMO | Admitting: Endocrinology

## 2019-07-04 DIAGNOSIS — J9500 Unspecified tracheostomy complication: Secondary | ICD-10-CM | POA: Diagnosis not present

## 2019-07-06 DIAGNOSIS — J9500 Unspecified tracheostomy complication: Secondary | ICD-10-CM | POA: Diagnosis not present

## 2019-07-07 DIAGNOSIS — J9500 Unspecified tracheostomy complication: Secondary | ICD-10-CM | POA: Diagnosis not present

## 2019-07-11 ENCOUNTER — Ambulatory Visit (INDEPENDENT_AMBULATORY_CARE_PROVIDER_SITE_OTHER): Payer: Medicare HMO | Admitting: Endocrinology

## 2019-07-11 ENCOUNTER — Other Ambulatory Visit: Payer: Self-pay

## 2019-07-11 ENCOUNTER — Encounter: Payer: Self-pay | Admitting: Endocrinology

## 2019-07-11 VITALS — BP 122/72 | HR 111 | Ht 61.0 in | Wt 247.8 lb

## 2019-07-11 DIAGNOSIS — R000 Tachycardia, unspecified: Secondary | ICD-10-CM

## 2019-07-11 DIAGNOSIS — J9500 Unspecified tracheostomy complication: Secondary | ICD-10-CM | POA: Diagnosis not present

## 2019-07-11 DIAGNOSIS — E109 Type 1 diabetes mellitus without complications: Secondary | ICD-10-CM

## 2019-07-11 LAB — POCT GLYCOSYLATED HEMOGLOBIN (HGB A1C): Hemoglobin A1C: 7.5 % — AB (ref 4.0–5.6)

## 2019-07-11 MED ORDER — OZEMPIC (1 MG/DOSE) 2 MG/1.5ML ~~LOC~~ SOPN: 1.0000 mg | PEN_INJECTOR | SUBCUTANEOUS | 11 refills | Status: DC

## 2019-07-11 MED ORDER — TRESIBA FLEXTOUCH 200 UNIT/ML ~~LOC~~ SOPN: 140.0000 [IU] | PEN_INJECTOR | Freq: Every day | SUBCUTANEOUS | 11 refills | Status: DC

## 2019-07-11 NOTE — Patient Instructions (Addendum)
I have sent a prescription to your pharmacy, to increase the [place]. Also, please reduce the insulin to 140 units daily.  check your blood sugar twice a day.  vary the time of day when you check, between before the 3 meals, and at bedtime.  also check if you have symptoms of your blood sugar being too high or too low.  please keep a record of the readings and bring it to your next appointment here (or you can bring the meter itself).  You can write it on any piece of paper.  please call us sooner if your blood sugar goes below 70, or if you have a lot of readings over 200. Please come back for a follow-up appointment in 3 months.

## 2019-07-11 NOTE — Progress Notes (Signed)
Subjective:    Patient ID: [name], female    DOB: 1971/01/13, 48 y.o.   MRN: [id number]  HPI Pt returns for f/u of diabetes mellitus: DM type: 1 Dx'ed: 3568 Complications: renal insuff and painful polyneuropathy.  Therapy: insulin since 2012, and Ozempic GDM: never DKA: once, in 2020 Severe hypoglycemia: never.  Pancreatitis: never.   Other: she declines weight loss surgery; edema precludes pioglitizone rx; she did not tolerate metformin (diarrhea); she is not a candidate for multiple daily injections, due to missing insulin doses.    Interval history: Pt says she seldom misses the insulin. no cbg record, but states cbg varies from 69-215.  It is in general higher as the day goes on, but not necessarily so.  Past Medical History:  Diagnosis Date  . Arthritis   . Asthma   . Chronic back pain   . Chronic chest pain   . Depression   . DM (diabetes mellitus) ([place])    INSULIN DEPENDENT  . GERD (gastroesophageal reflux disease)   . Headache(784.0)   . Hypertension   . Hypokalemia   . Respiratory disease 05/2017  . Tracheostomy in place [facility]) 02/2017    Past Surgical History:  Procedure Laterality Date  . APPENDECTOMY    . CESAREAN SECTION     x 3  . CHOLECYSTECTOMY N/A 03/02/2014   Procedure: LAPAROSCOPIC CHOLECYSTECTOMY;  Surgeon: Joyice Faster. Cornett, MD;  Location: [facility];  Service: General;  Laterality: N/A;  . HERNIA REPAIR    . PANENDOSCOPY N/A 03/04/2017   Procedure: PANENDOSCOPY WITH POSSIBLE FOREIGN BODY REMOVAL;  Surgeon: Jodi Marble, MD;  Location: [facility];  Service: ENT;  Laterality: N/A;  . ROTATOR CUFF REPAIR    . TRACHEOSTOMY  02/2017  . VESICOVAGINAL FISTULA CLOSURE W/ TAH  2009    Social History   Socioeconomic History  . Marital status: Married    Spouse name: Not on file  . Number of children: Not on file  . Years of education: Not on file  . Highest education level: Not on file  Occupational History  . Not on file  Social  Needs  . Financial resource strain: Not on file  . Food insecurity    Worry: Not on file    Inability: Not on file  . Transportation needs    Medical: Not on file    Non-medical: Not on file  Tobacco Use  . Smoking status: Former Smoker    Packs/day: 0.25    Years: 22.00    Pack years: 5.50    Types: Cigarettes    Quit date: 01/20/2017    Years since quitting: 2.4  . Smokeless tobacco: Never Used  Substance and Sexual Activity  . Alcohol use: No    Alcohol/week: 1.0 - 2.0 standard drinks    Types: 1 - 2 Glasses of wine per week    Comment: socially  . Drug use: No  . Sexual activity: Yes    Partners: Male    Birth control/protection: None  Lifestyle  . Physical activity    Days per week: Not on file    Minutes per session: Not on file  . Stress: Not on file  Relationships  . Social Herbalist on phone: Not on file    Gets together: Not on file    Attends religious service: Not on file    Active member of club or organization: Not on file    Attends meetings of clubs or organizations:  Not on file    Relationship status: Not on file  . Intimate partner violence    Fear of current or ex partner: Not on file    Emotionally abused: Not on file    Physically abused: Not on file    Forced sexual activity: Not on file  Other Topics Concern  . Not on file  Social History Narrative  . Not on file    Current Outpatient Medications on File Prior to Visit  Medication Sig Dispense Refill  . albuterol (PROAIR HFA) 108 (90 Base) MCG/ACT inhaler Inhale 2 puffs into the lungs every 6 (six) hours as needed for wheezing or shortness of breath. 1 Inhaler 3  . albuterol (PROVENTIL) (2.5 MG/3ML) 0.083% nebulizer solution Take 3 mLs (2.5 mg total) by nebulization every 6 (six) hours as needed for wheezing or shortness of breath. 75 mL 3  . amLODipine (NORVASC) 10 MG tablet Take 1 tablet (10 mg total) by mouth daily. 90 tablet 1  . aspirin-acetaminophen-caffeine (EXCEDRIN  MIGRAINE) 250-250-65 MG tablet Take 2 tablets by mouth every 6 (six) hours as needed for headache.    Marland Kitchen atorvastatin (LIPITOR) 20 MG tablet Take 1 tablet (20 mg total) by mouth daily. 90 tablet 1  . Blood Glucose Monitoring Suppl (ONETOUCH VERIO) w/Device KIT 1 each by Does not apply route daily. Use to monitor glucose levels daily; E11.9 1 kit 0  . budesonide-formoterol (SYMBICORT) 160-4.5 MCG/ACT inhaler Inhale 2 puffs into the lungs 2 (two) times daily. 3 Inhaler 1  . buPROPion (WELLBUTRIN SR) 150 MG 12 hr tablet Take 1 tablet (150 mg total) by mouth 2 (two) times daily. 60 tablet 3  . carvedilol (COREG) 25 MG tablet Take 1 tablet (25 mg total) by mouth 2 (two) times daily with a meal. 180 tablet 1  . cyclobenzaprine (FLEXERIL) 10 MG tablet Take 1 tablet (10 mg total) by mouth 3 (three) times daily as needed for muscle spasms. 30 tablet 0  . furosemide (LASIX) 20 MG tablet Take 1 tablet (20 mg total) by mouth daily. 30 tablet 2  . gabapentin (NEURONTIN) 300 MG capsule Take 2 capsules (600 mg total) by mouth 2 (two) times daily. 360 capsule 1  . glucose blood (ONETOUCH VERIO) test strip Use to monitor glucose levels daily; E11.9 100 each 2  . hydrOXYzine (ATARAX/VISTARIL) 25 MG tablet Take 1 tablet (25 mg total) by mouth 3 (three) times daily as needed for anxiety. 60 tablet 1  . ibuprofen (ADVIL,MOTRIN) 600 MG tablet Take 1 tablet (600 mg total) by mouth every 8 (eight) hours as needed. (Patient taking differently: Take 600 mg by mouth every 8 (eight) hours as needed for mild pain. ) 30 tablet 0  . Insulin Pen Needle (FIFTY50 PEN NEEDLES) 31G X 8 MM MISC Inject 1 Units as directed daily.    Marland Kitchen lisinopril (ZESTRIL) 10 MG tablet Take 1 tablet (10 mg total) by mouth daily. 90 tablet 1  . Melatonin 3 MG TABS Take 3 mg by mouth at bedtime as needed (for sleep).     . montelukast (SINGULAIR) 10 MG tablet Take 1 tablet (10 mg total) by mouth at bedtime. 90 tablet 1  . naproxen (NAPROSYN) 500 MG tablet  Take 1 tablet (500 mg total) by mouth 2 (two) times daily with a meal. 60 tablet 0  . OneTouch Delica Lancets 93T MISC 1 each by Does not apply route daily. Use to monitor glucose levels daily; E11.9 100 each 2  . sertraline (ZOLOFT) 100  MG tablet Take 1 tablet (100 mg total) by mouth at bedtime. 90 tablet 1  . SUMAtriptan (IMITREX) 100 MG tablet At the onset of a migraine; may repeat in 2 hours. Max daily dose 215m 20 tablet 1  . tiZANidine (ZANAFLEX) 4 MG tablet Take 1 tablet (4 mg total) by mouth every 8 (eight) hours as needed for muscle spasms. 90 tablet 3  . traMADol (ULTRAM) 50 MG tablet Take 1 tablet (50 mg total) by mouth every 12 (twelve) hours as needed. 30 tablet 0  . [DISCONTINUED] fluticasone-salmeterol (ADVAIR HFA) 230-21 MCG/ACT inhaler Inhale 2 puffs into the lungs 2 (two) times daily. 1 Inhaler 3   No current facility-administered medications on file prior to visit.     Allergies  Allergen Reactions  . Reglan [Metoclopramide] Other (See Comments)    Panic attack    Family History  Problem Relation Age of Onset  . Heart attack Mother   . Stroke Mother   . Diabetes Mother   . Hypertension Mother   . Arthritis Mother   . Stroke Father   . Hypertension Sister   . Diabetes Sister   . Seizures Brother   . Diabetes Brother     BP 122/72 (BP Location: Right Wrist, Patient Position: Sitting, Cuff Size: Large)   Pulse (!) 111   Ht 5' 1" (1.549 m)   Wt 247 lb 12.8 oz (112.4 kg)   SpO2 93%   BMI 46.82 kg/m    Review of Systems Denies LOC and nausea.  She has lost a few lbs.      Objective:   Physical Exam VITAL SIGNS:  See vs page GENERAL: no distress Pulses: dorsalis pedis intact bilat.   MSK: no deformity of the feet CV: trace bilat leg edema Skin:  no ulcer on the feet.  normal color and temp on the feet. Neuro: sensation is intact to touch on the feet Ext: there is bilateral onychomycosis of the toenails.     Lab Results  Component Value Date    HGBA1C 7.5 (A) 07/11/2019   Lab Results  Component Value Date   TSH 0.84 04/12/2019       Assessment & Plan:  Tachycardia: prob due to doe.  We'll follow Type 1 DM: this is the best control this pt should aim for, given this regimen, which does match insulin to her changing needs throughout the day Hypoglycemia: this limits aggressiveness of glycemic control.     Patient Instructions  I have sent a prescription to your pharmacy, to increase the [place] Also, please reduce the insulin to 140 units daily.  check your blood sugar twice a day.  vary the time of day when you check, between before the 3 meals, and at bedtime.  also check if you have symptoms of your blood sugar being too high or too low.  please keep a record of the readings and bring it to your next appointment here (or you can bring the meter itself).  You can write it on any piece of paper.  please call [place] if your blood sugar goes below 70, or if you have a lot of readings over 200. Please come back for a follow-up appointment in 3 months.

## 2019-07-12 ENCOUNTER — Telehealth: Payer: Self-pay | Admitting: Orthopedic Surgery

## 2019-07-12 NOTE — Telephone Encounter (Signed)
FYI

## 2019-07-12 NOTE — Telephone Encounter (Signed)
Patient called. She would like for Dr.Dean to know that her A1C is down 7.5

## 2019-07-13 DIAGNOSIS — J9500 Unspecified tracheostomy complication: Secondary | ICD-10-CM | POA: Diagnosis not present

## 2019-07-13 NOTE — Telephone Encounter (Signed)
See note from Dr Marlou Sa. Please get patients surgery rescheduled.

## 2019-07-13 NOTE — Telephone Encounter (Signed)
Please call [name] and see if we can get her on the schedule at some point for the same thing she was going to have done before thanks

## 2019-07-14 DIAGNOSIS — J9500 Unspecified tracheostomy complication: Secondary | ICD-10-CM | POA: Diagnosis not present

## 2019-07-18 DIAGNOSIS — J9500 Unspecified tracheostomy complication: Secondary | ICD-10-CM | POA: Diagnosis not present

## 2019-07-20 DIAGNOSIS — J9500 Unspecified tracheostomy complication: Secondary | ICD-10-CM | POA: Diagnosis not present

## 2019-07-21 DIAGNOSIS — J9500 Unspecified tracheostomy complication: Secondary | ICD-10-CM | POA: Diagnosis not present

## 2019-07-25 ENCOUNTER — Other Ambulatory Visit (HOSPITAL_COMMUNITY)
Admission: RE | Admit: 2019-07-25 | Discharge: 2019-07-25 | Disposition: A | Discharge status: Home / Self Care | Payer: Medicare HMO | Source: Ambulatory Visit | Attending: Orthopedic Surgery | Admitting: Orthopedic Surgery

## 2019-07-25 DIAGNOSIS — Z01812 Encounter for preprocedural laboratory examination: Secondary | ICD-10-CM | POA: Insufficient documentation

## 2019-07-25 DIAGNOSIS — Z20828 Contact with and (suspected) exposure to other viral communicable diseases: Secondary | ICD-10-CM | POA: Insufficient documentation

## 2019-07-25 DIAGNOSIS — J9500 Unspecified tracheostomy complication: Secondary | ICD-10-CM | POA: Diagnosis not present

## 2019-07-25 DIAGNOSIS — M75111 Incomplete rotator cuff tear or rupture of right shoulder, not specified as traumatic: Secondary | ICD-10-CM | POA: Diagnosis not present

## 2019-07-26 LAB — NOVEL CORONAVIRUS, NAA (HOSP ORDER, SEND-OUT TO REF LAB; TAT 18-24 HRS): SARS-CoV-2, NAA: NOT DETECTED

## 2019-07-27 ENCOUNTER — Other Ambulatory Visit: Payer: Self-pay

## 2019-07-27 ENCOUNTER — Encounter (HOSPITAL_COMMUNITY): Payer: Self-pay | Admitting: *Deleted

## 2019-07-27 DIAGNOSIS — J9500 Unspecified tracheostomy complication: Secondary | ICD-10-CM | POA: Diagnosis not present

## 2019-07-27 HISTORY — PX: ROTATOR CUFF REPAIR: SHX139

## 2019-07-27 NOTE — Progress Notes (Signed)
Anesthesia Chart Review: SAME DAY WORK-UP   Case: [id number] Date/Time: 07/28/19 1200   Procedure: RIGHT SHOULDER ARTHROSCOP, MINI OPEN ROTATOR CUFF TEAR REPAIR, POSSIBLE BICEPS TENODESIS, DISTAL CLAVICLE EXCISION (Right )   Anesthesia type: General   Pre-op diagnosis: right shoulder rotator cuff tear, acromioclavicular osteoarthritis   Location: [facility] OR ROOM 06 / [place] [facility]   Surgeon: Meredith Pel, MD      DISCUSSION: Patient is a 48 year old female scheduled for the above procedure. Surgery was initially scheduled for August, but A1c 13.7 on 03/22/19 when hospitalized for mild DKA with asthma exacerbation (03/22/19-03/26/19). Surgery postponed until DM better controlled. As of 07/21/19, A1c down to 7.5%.  History includes former smoker (quit 01/2017), tracheostomy (03/08/17 following glottic intubation injury and subsequent development of tracheal stenosis; seen Q 3 months at [facility] for management/trach exchange; if desires resection/decannulation would require evaluation at [facility] or [facility]) , OSA (no CPAP since trach), HTN, asthma, GERD, chronic pain, DM (insulin dependent), HLD, COPD, obesity.  Details surrounding tracheostomy history: Admitted 02/18/17 - 02/27/17 to [facility] with acute respiratory failure due to asthma exacerbation and required intubation (02/18/17-02/24/18). Also required CRRT for AKI with refractory hyperkalemia/worsening metabolic acidosis. On 03/03/17, ENT consulted due to concern of coughing up foreign material concerning for part of the ETT. She underwent direct laryngoscopy/bronchoscopy/esophagoscopy on 03/04/17 revealing "Floppy epiglottis.  Posterior vocal cord bilateral and posterior comissure eschar c/w intubation injury. Subglottic trachea essentially completely full of what appears to be chronic granulation tissue for a distance of 3-4 centimeters.  A curvilinear foreign body possibly consistent with necrotic tracheal ring cartilage." She was left intubated  after the case due to not being able to establish an adequate airway, and was transferred to [facility] where she underwent surgical tracheostomy (#^ DCT) on 03/09/19. While in OR, "they found glottis to be extremely edematous, fibrinous debris in posterior glottis and lining the trachea, and trachea very soft and necrotic anteriorly. Tracheostomy was difficult 2/2 patient's anatomy. Given extremely critical airway, patient sedating for 48 hours s/p trach for more maturation of tracheostomy tract. Per ENT recs,CT Surgery consultedfor further evaluation of possible aberrate innominate artery given critical airway status and risk for development of tracheo-innominate fistula. CTSrecommendingmaintaining strict BP control with goal SBP <140 mmHg, avoid over inflating tracheal cuff, and no indication for acute surgical intervention at this time. Rheumatology also consulted for AI/rheumatological process who ultimately feel that clinical presentation likely post-intubation complication."  She was discharged to [facility]. On 05/18/17 she underwent repeat suspension mircrodirect laryngoscopy (SMDL) but noted to have near complete subglottic stenosis with only minimal improvement s/p dilation. CT scan revealed the segment of scarring to be quite thick. She was referred to Dr. Lianne Moris for potential tracheal resection, but bronchoscopy revealed the stenotic segment to to be too close to the cords to allow for resection. She then underwent attempted resection and reconstruction with Dr. Hendricks Limes, however due to the long segment and intimate relationship to her high riding innominate artery, the procedure was aborted." Last tracheal dilation seen was from 10/30/17 (unable to creat a reasonable opening to then safely ablate the scar). Per operative note, "Unfortunately, she has a difficult problem which is even made more difficult by the length of the stenosis, body habitus, high riding innominate and uncontrolled diabetes. She remains a  poor candidate for cricotracheal resection and reanastamosis." Should she desire definitive resection and trach decannulation in the future, she would need to go to the [facility] or [place].  According to ENT note on 05/09/19, trach changed to 4 CFS.  A1c improved to 7.5% (down from 10.1) on 07/11/19.  She denied SOB, chest pain, cough, fever per PAT RN phone interview.  07/25/19 COVID-19 test negative.  Will forward note for anesthesiologist to review airway history, otherwise further evaluation on the day of surgery.    VS: Ht 5' 1" (1.549 m)   Wt 111.1 kg   LMP  (LMP Unknown)   BMI 46.29 kg/m   PROVIDERS: [name], MD is PCP Renato Shin, MD is endocrinologist Mila Homer, DO is ENT ([place]). Seen by resident Carlis Abbott, MD on 05/09/19. Trach changed to 4 CFS. Patient not interested in SLP evaluation for PMV, so continue finger plugging for voice. Follow-up in 3 months for regularly scheduled trach change. Note also states that should she wish "to pursue definitive resection and decannulation she will need to go to the [place] or [place] for second opinion" which she was not currently interested in.  - She is not followed routinely by cardiology, but was seen by Loralie Champagne in June 2015 during hospitalization for atypical chest pain, felt to be GI in nature. Echo ordered which showed normal LVEF and wall motion, so did not recommend ischemic work-up. Ended up undergoing cholecystectomy 03/02/14.   LABS: She is for updated labs on the day of surgery.    IMAGES: 1V CXR 03/25/19: IMPRESSION: Lower lung volumes from prior exam, no other interval change. Tracheostomy tube tip remains at the thoracic inlet.  CTA Chest 03/22/19: IMPRESSION: 1. No evidence of pulmonary embolism or other acute finding. 2. Cardiomegaly and hepatic steatosis.   EKG: 03/12/19: Sinus tachycardia at 130 bpm Low voltage, precordial  leads Nonspecific T abnormalities, diffuse leads Rate faster than previous Confirmed by Pryor Curia [phone]) on 03/22/2019 4:53:48 AM Also confirmed by Ward, Cyril Mourning [phone]), editor Hattie Perch ([id number]) on 03/23/2019 7:59:18 AM   CV: Echo 06/04/17: Study Conclusions - Left ventricle: The cavity size was normal. Wall thickness was   normal. Systolic function was normal. The estimated ejection   fraction was in the range of 60% to 65%. Wall motion was normal;   there were no regional wall motion abnormalities. Doppler   parameters are consistent with abnormal left ventricular   relaxation (grade 1 diastolic dysfunction). - Mitral valve: Valve area by pressure half-time: 0.72 cm^2.   Past Medical History:  Diagnosis Date  . Anxiety   . Arthritis   . Asthma   . Chronic back pain   . Chronic chest pain    resolved, no problems since 2019 per patient 07/27/19  . COPD (chronic obstructive pulmonary disease) ([place])   . Depression   . DM (diabetes mellitus) ([place])    INSULIN DEPENDENT - TYPE 1  . GERD (gastroesophageal reflux disease)   . Headache(784.0)   . Hyperlipidemia    no med, diet controlled  . Hypertension   . Hypokalemia   . Respiratory disease 05/2017  . Sleep apnea    does not use CPAP due to trach  . Tracheostomy in place [facility]) 02/2017    Past Surgical History:  Procedure Laterality Date  . ABDOMINAL HYSTERECTOMY  2009  . APPENDECTOMY    . CESAREAN SECTION     x 3  . CHOLECYSTECTOMY N/A 03/02/2014   Procedure: LAPAROSCOPIC CHOLECYSTECTOMY;  Surgeon: Joyice Faster. Cornett, MD;  Location: [facility];  Service: General;  Laterality: N/A;  . HERNIA REPAIR    . PANENDOSCOPY N/A 03/04/2017  Procedure: PANENDOSCOPY WITH POSSIBLE FOREIGN BODY REMOVAL;  Surgeon: Wolicki, Karol, MD;  [facility];  Service: ENT;  Laterality: N/A;  . ROTATOR CUFF REPAIR Left   . TRACHEOSTOMY  02/2017  . VESICOVAGINAL FISTULA CLOSURE W/ TAH  2009    MEDICATIONS: No current  facility-administered medications for this encounter.    . albuterol (PROAIR HFA) 108 (90 Base) MCG/ACT inhaler  . albuterol (PROVENTIL) (2.5 MG/3ML) 0.083% nebulizer solution  . amLODipine (NORVASC) 10 MG tablet  . aspirin-acetaminophen-caffeine (EXCEDRIN MIGRAINE) 250-250-65 MG tablet  . atorvastatin (LIPITOR) 20 MG tablet  . budesonide-formoterol (SYMBICORT) 160-4.5 MCG/ACT inhaler  . buPROPion (WELLBUTRIN SR) 150 MG 12 hr tablet  . carvedilol (COREG) 25 MG tablet  . cyclobenzaprine (FLEXERIL) 10 MG tablet  . furosemide (LASIX) 20 MG tablet  . gabapentin (NEURONTIN) 300 MG capsule  . hydrOXYzine (ATARAX/VISTARIL) 25 MG tablet  . Insulin Degludec (TRESIBA FLEXTOUCH) 200 UNIT/ML SOPN  . lisinopril (ZESTRIL) 10 MG tablet  . Melatonin 3 MG TABS  . montelukast (SINGULAIR) 10 MG tablet  . naproxen (NAPROSYN) 500 MG tablet  . Semaglutide, 1 MG/DOSE, (OZEMPIC, 1 MG/DOSE,) 2 MG/1.5ML SOPN  . sertraline (ZOLOFT) 100 MG tablet  . SUMAtriptan (IMITREX) 100 MG tablet  . tiZANidine (ZANAFLEX) 4 MG tablet  . Blood Glucose Monitoring Suppl (ONETOUCH VERIO) w/Device KIT  . glucose blood (ONETOUCH VERIO) test strip  . Insulin Pen Needle (FIFTY50 PEN NEEDLES) 31G X 8 MM MISC  . OneTouch Delica Lancets 33G MISC    Allison Zelenak, PA-C Surgical Short Stay/Anesthesiology [facility] Phone [phone] [facility] Phone [phone] 07/27/2019 12:06 PM       

## 2019-07-27 NOTE — Progress Notes (Signed)
Patient denies shortness of breath, fever, cough and chest pain.  PCP - Dr Margarita Rana, Dr Renato Shin for DM Cardiologist - denies  Chest x-ray - 03/25/19 1 view, 03/21/19 2 view EKG - 03/22/19 Stress Test - denies ECHO - 06/04/17 Cardiac Cath - denies  Fasting Blood Sugar - 90-100s Checks Blood Sugar 3 times a day   . THE MORNING OF SURGERY, take 80% Tresiba insulin dose - 111 units.  . If your blood sugar is less than 70 mg/dL, you will need to treat for low blood sugar: o Do not take insulin. o Treat a low blood sugar (less than 70 mg/dL) with  cup of clear juice (cranberry or apple). o Recheck blood sugar in 15 minutes after treatment (to make sure it is greater than 70 mg/dL). If your blood sugar is not greater than 70 mg/dL on recheck, call [phone] for further instructions.  OSA - but does not use CPAP due to trach  ERAS: Clears til 9:15 am DOS, no drink.  Anesthesia review: Yes  STOP Now taking any Aspirin (unless otherwise instructed by your surgeon), Aleve, Naproxen, Ibuprofen, Motrin, Advil, Goody's, BC's, all herbal medications, fish oil, and all vitamins.   Coronavirus Screening Covid test on 07/25/19 was negative.  Patient verbalized understanding of instructions that were given via phone.

## 2019-07-27 NOTE — Anesthesia Preprocedure Evaluation (Addendum)
Anesthesia Evaluation  Patient identified by MRN, date of birth, ID band Patient awake    Reviewed: Allergy & Precautions, NPO status , Patient's Chart, lab work & pertinent test results  Airway Mallampati: Trach       Dental   Pulmonary asthma , sleep apnea , COPD,  COPD inhaler, former smoker,  Tracheostomy in place since 02/2017  Tracheal stenosis   Pulmonary exam normal        Cardiovascular hypertension, Pt. on home beta blockers and Pt. on medications Normal cardiovascular exam  ECG: NSR, rate 94   Neuro/Psych  Headaches, PSYCHIATRIC DISORDERS Anxiety Depression    GI/Hepatic negative GI ROS, Neg liver ROS,   Endo/Other  diabetes, Insulin DependentMorbid obesity  Renal/GU negative Renal ROS     Musculoskeletal  (+) Arthritis ,   Abdominal (+) + obese,   Peds  Hematology HLD   Anesthesia Other Findings right shoulder rotator cuff tear, acromioclavicular osteoarthritis  Reproductive/Obstetrics                           Anesthesia Physical Anesthesia Plan  ASA: IV  Anesthesia Plan: General   Post-op Pain Management:    Induction: Intravenous and Inhalational  PONV Risk Score and Plan: 4 or greater and Midazolam, Dexamethasone, Ondansetron and Treatment may vary due to age or medical condition  Airway Management Planned: Tracheostomy  Additional Equipment:   Intra-op Plan:   Post-operative Plan:   Informed Consent: I have reviewed the patients History and Physical, chart, labs and discussed the procedure including the risks, benefits and alternatives for the proposed anesthesia with the patient or authorized representative who has indicated his/her understanding and acceptance.       Plan Discussed with: CRNA  Anesthesia Plan Comments: (Reviewed PAT note written 07/27/2019 by Myra Gianotti, PA-C. Has chronic tracheostomy (see APP note for more details). Asthma exacerbation  ~ 02/2019.   )       Anesthesia Quick Evaluation

## 2019-07-28 ENCOUNTER — Ambulatory Visit (HOSPITAL_COMMUNITY): Payer: Medicare HMO | Admitting: Vascular Surgery

## 2019-07-28 ENCOUNTER — Other Ambulatory Visit: Payer: Self-pay

## 2019-07-28 ENCOUNTER — Encounter (HOSPITAL_COMMUNITY)
Admission: RE | Disposition: A | Discharge status: Home / Self Care | Payer: Self-pay | Source: Home / Self Care | Attending: Orthopedic Surgery

## 2019-07-28 ENCOUNTER — Observation Stay (HOSPITAL_COMMUNITY)
Admission: RE | Admit: 2019-07-28 | Discharge: 2019-07-29 | Disposition: A | Discharge status: Home / Self Care | Payer: Medicare HMO | Attending: Orthopedic Surgery | Admitting: Orthopedic Surgery

## 2019-07-28 ENCOUNTER — Encounter (HOSPITAL_COMMUNITY): Payer: Self-pay

## 2019-07-28 DIAGNOSIS — E785 Hyperlipidemia, unspecified: Secondary | ICD-10-CM | POA: Diagnosis not present

## 2019-07-28 DIAGNOSIS — F411 Generalized anxiety disorder: Secondary | ICD-10-CM | POA: Diagnosis not present

## 2019-07-28 DIAGNOSIS — M545 Low back pain: Secondary | ICD-10-CM | POA: Insufficient documentation

## 2019-07-28 DIAGNOSIS — Z7982 Long term (current) use of aspirin: Secondary | ICD-10-CM | POA: Insufficient documentation

## 2019-07-28 DIAGNOSIS — K219 Gastro-esophageal reflux disease without esophagitis: Secondary | ICD-10-CM | POA: Diagnosis not present

## 2019-07-28 DIAGNOSIS — S43431A Superior glenoid labrum lesion of right shoulder, initial encounter: Secondary | ICD-10-CM | POA: Diagnosis not present

## 2019-07-28 DIAGNOSIS — M75111 Incomplete rotator cuff tear or rupture of right shoulder, not specified as traumatic: Secondary | ICD-10-CM | POA: Diagnosis not present

## 2019-07-28 DIAGNOSIS — Z794 Long term (current) use of insulin: Secondary | ICD-10-CM | POA: Insufficient documentation

## 2019-07-28 DIAGNOSIS — I119 Hypertensive heart disease without heart failure: Secondary | ICD-10-CM | POA: Diagnosis not present

## 2019-07-28 DIAGNOSIS — M75100 Unspecified rotator cuff tear or rupture of unspecified shoulder, not specified as traumatic: Secondary | ICD-10-CM | POA: Diagnosis present

## 2019-07-28 DIAGNOSIS — S43431D Superior glenoid labrum lesion of right shoulder, subsequent encounter: Secondary | ICD-10-CM

## 2019-07-28 DIAGNOSIS — M19011 Primary osteoarthritis, right shoulder: Secondary | ICD-10-CM | POA: Diagnosis not present

## 2019-07-28 DIAGNOSIS — Z87891 Personal history of nicotine dependence: Secondary | ICD-10-CM | POA: Insufficient documentation

## 2019-07-28 DIAGNOSIS — Z7951 Long term (current) use of inhaled steroids: Secondary | ICD-10-CM | POA: Insufficient documentation

## 2019-07-28 DIAGNOSIS — Z8249 Family history of ischemic heart disease and other diseases of the circulatory system: Secondary | ICD-10-CM | POA: Insufficient documentation

## 2019-07-28 DIAGNOSIS — M75121 Complete rotator cuff tear or rupture of right shoulder, not specified as traumatic: Secondary | ICD-10-CM

## 2019-07-28 DIAGNOSIS — E109 Type 1 diabetes mellitus without complications: Secondary | ICD-10-CM | POA: Insufficient documentation

## 2019-07-28 DIAGNOSIS — J398 Other specified diseases of upper respiratory tract: Secondary | ICD-10-CM | POA: Insufficient documentation

## 2019-07-28 DIAGNOSIS — M75101 Unspecified rotator cuff tear or rupture of right shoulder, not specified as traumatic: Principal | ICD-10-CM | POA: Insufficient documentation

## 2019-07-28 DIAGNOSIS — Z930 Tracheostomy status: Secondary | ICD-10-CM | POA: Diagnosis not present

## 2019-07-28 DIAGNOSIS — G4733 Obstructive sleep apnea (adult) (pediatric): Secondary | ICD-10-CM | POA: Insufficient documentation

## 2019-07-28 DIAGNOSIS — G894 Chronic pain syndrome: Secondary | ICD-10-CM | POA: Insufficient documentation

## 2019-07-28 DIAGNOSIS — I10 Essential (primary) hypertension: Secondary | ICD-10-CM | POA: Diagnosis not present

## 2019-07-28 DIAGNOSIS — Z6841 Body Mass Index (BMI) 40.0 and over, adult: Secondary | ICD-10-CM | POA: Diagnosis not present

## 2019-07-28 DIAGNOSIS — R69 Illness, unspecified: Secondary | ICD-10-CM | POA: Diagnosis not present

## 2019-07-28 DIAGNOSIS — Z791 Long term (current) use of non-steroidal anti-inflammatories (NSAID): Secondary | ICD-10-CM | POA: Insufficient documentation

## 2019-07-28 DIAGNOSIS — Z833 Family history of diabetes mellitus: Secondary | ICD-10-CM | POA: Insufficient documentation

## 2019-07-28 DIAGNOSIS — F329 Major depressive disorder, single episode, unspecified: Secondary | ICD-10-CM | POA: Diagnosis not present

## 2019-07-28 DIAGNOSIS — J449 Chronic obstructive pulmonary disease, unspecified: Secondary | ICD-10-CM | POA: Diagnosis not present

## 2019-07-28 DIAGNOSIS — Z79899 Other long term (current) drug therapy: Secondary | ICD-10-CM | POA: Diagnosis not present

## 2019-07-28 DIAGNOSIS — K760 Fatty (change of) liver, not elsewhere classified: Secondary | ICD-10-CM | POA: Diagnosis not present

## 2019-07-28 DIAGNOSIS — Z888 Allergy status to other drugs, medicaments and biological substances status: Secondary | ICD-10-CM | POA: Insufficient documentation

## 2019-07-28 HISTORY — DX: Anxiety disorder, unspecified: F41.9

## 2019-07-28 HISTORY — DX: Sleep apnea, unspecified: G47.30

## 2019-07-28 HISTORY — DX: Unspecified rotator cuff tear or rupture of unspecified shoulder, not specified as traumatic: M75.100

## 2019-07-28 HISTORY — DX: Hyperlipidemia, unspecified: E78.5

## 2019-07-28 HISTORY — DX: Chronic obstructive pulmonary disease, unspecified: J44.9

## 2019-07-28 HISTORY — PX: SHOULDER ARTHROSCOPY WITH SUBACROMIAL DECOMPRESSION, ROTATOR CUFF REPAIR AND BICEP TENDON REPAIR: SHX5687

## 2019-07-28 LAB — BASIC METABOLIC PANEL
Anion gap: 11 (ref 5–15)
BUN: 9 mg/dL (ref 6–20)
CO2: 21 mmol/L — ABNORMAL LOW (ref 22–32)
Calcium: 9.1 mg/dL (ref 8.9–10.3)
Chloride: 105 mmol/L (ref 98–111)
Creatinine, Ser: 0.99 mg/dL (ref 0.44–1.00)
GFR calc Af Amer: >60 mL/min (ref >60)
GFR calc non Af Amer: >60 mL/min (ref >60)
Glucose, Bld: 130 mg/dL — ABNORMAL HIGH (ref 70–99)
Potassium: 3.7 mmol/L (ref 3.5–5.1)
Sodium: 137 mmol/L (ref 135–145)

## 2019-07-28 LAB — CBC
HCT: 36.4 % (ref 36.0–46.0)
Hemoglobin: 11.7 g/dL — ABNORMAL LOW (ref 12.0–15.0)
MCH: 28 pg (ref 26.0–34.0)
MCHC: 32.1 g/dL (ref 30.0–36.0)
MCV: 87.1 fL (ref 80.0–100.0)
Platelets: 403 10*3/uL — ABNORMAL HIGH (ref 150–400)
RBC: 4.18 MIL/uL (ref 3.87–5.11)
RDW: 13.6 % (ref 11.5–15.5)
WBC: 8.6 10*3/uL (ref 4.0–10.5)
nRBC: 0 % (ref 0.0–0.2)

## 2019-07-28 LAB — GLUCOSE, CAPILLARY
Glucose-Capillary: 142 mg/dL — ABNORMAL HIGH (ref 70–99)
Glucose-Capillary: 106 mg/dL — ABNORMAL HIGH (ref 70–99)
Glucose-Capillary: 132 mg/dL — ABNORMAL HIGH (ref 70–99)

## 2019-07-28 LAB — SURGICAL PCR SCREEN
MRSA, PCR: NEGATIVE
Staphylococcus aureus: NEGATIVE

## 2019-07-28 SURGERY — SHOULDER ARTHROSCOPY WITH SUBACROMIAL DECOMPRESSION, ROTATOR CUFF REPAIR AND BICEP TENDON REPAIR
Anesthesia: General | Laterality: Right

## 2019-07-28 MED ORDER — AMLODIPINE BESYLATE 10 MG PO TABS
10.0000 mg | ORAL_TABLET | Freq: Every day | ORAL | Status: DC
  Administered 2019-07-29: 08:00:00 10 mg via ORAL
  Filled 2019-07-28: qty 1

## 2019-07-28 MED ORDER — PROMETHAZINE HCL 25 MG/ML IJ SOLN: 6.2500 mg | INTRAMUSCULAR | Status: DC | PRN

## 2019-07-28 MED ORDER — KETOROLAC TROMETHAMINE 30 MG/ML IJ SOLN
INTRAMUSCULAR | Status: AC
  Filled 2019-07-28: qty 1

## 2019-07-28 MED ORDER — CHLORHEXIDINE GLUCONATE 4 % EX LIQD: 60.0000 mL | Freq: Once | CUTANEOUS | Status: DC

## 2019-07-28 MED ORDER — PHENYLEPHRINE HCL-NACL 20-0.9 MG/250ML-% IV SOLN
INTRAVENOUS | Status: DC | PRN
  Administered 2019-07-28: 50 ug/min via INTRAVENOUS

## 2019-07-28 MED ORDER — HYDROMORPHONE HCL 1 MG/ML IJ SOLN
INTRAMUSCULAR | Status: AC
  Filled 2019-07-28: qty 1

## 2019-07-28 MED ORDER — MORPHINE SULFATE (PF) 4 MG/ML IV SOLN
INTRAVENOUS | Status: AC
  Filled 2019-07-28: qty 2

## 2019-07-28 MED ORDER — KETOROLAC TROMETHAMINE 30 MG/ML IJ SOLN
30.0000 mg | Freq: Once | INTRAMUSCULAR | Status: AC | PRN
  Administered 2019-07-28: 30 mg via INTRAVENOUS

## 2019-07-28 MED ORDER — ONDANSETRON HCL 4 MG/2ML IJ SOLN
INTRAMUSCULAR | Status: DC | PRN
  Administered 2019-07-28: 4 mg via INTRAVENOUS

## 2019-07-28 MED ORDER — CLONIDINE HCL (ANALGESIA) 100 MCG/ML EP SOLN
EPIDURAL | Status: DC | PRN
  Administered 2019-07-28: 1 mL via INTRA_ARTICULAR

## 2019-07-28 MED ORDER — METHOCARBAMOL 500 MG PO TABS
ORAL_TABLET | ORAL | Status: AC
  Filled 2019-07-28: qty 1

## 2019-07-28 MED ORDER — ASPIRIN 81 MG PO CHEW
81.0000 mg | CHEWABLE_TABLET | Freq: Every day | ORAL | Status: DC
  Administered 2019-07-29: 08:00:00 81 mg via ORAL
  Filled 2019-07-28: qty 1

## 2019-07-28 MED ORDER — HYDROMORPHONE HCL 1 MG/ML IJ SOLN
0.2500 mg | INTRAMUSCULAR | Status: DC | PRN
  Administered 2019-07-28 (×4): 0.5 mg via INTRAVENOUS

## 2019-07-28 MED ORDER — EPHEDRINE 5 MG/ML INJ
INTRAVENOUS | Status: AC
  Filled 2019-07-28: qty 10

## 2019-07-28 MED ORDER — ONDANSETRON HCL 4 MG/2ML IJ SOLN
INTRAMUSCULAR | Status: AC
  Filled 2019-07-28: qty 2

## 2019-07-28 MED ORDER — TRAMADOL HCL 50 MG PO TABS
50.0000 mg | ORAL_TABLET | Freq: Four times a day (QID) | ORAL | Status: DC
  Administered 2019-07-28 – 2019-07-29 (×4): 50 mg via ORAL
  Filled 2019-07-28 (×4): qty 1

## 2019-07-28 MED ORDER — LISINOPRIL 10 MG PO TABS
10.0000 mg | ORAL_TABLET | Freq: Every day | ORAL | Status: DC
  Administered 2019-07-29: 10 mg via ORAL
  Filled 2019-07-28: qty 1

## 2019-07-28 MED ORDER — MIDAZOLAM HCL 2 MG/2ML IJ SOLN
INTRAMUSCULAR | Status: AC
  Filled 2019-07-28: qty 2

## 2019-07-28 MED ORDER — SODIUM CHLORIDE 0.9 % IR SOLN
Status: DC | PRN
  Administered 2019-07-28: 100 mL

## 2019-07-28 MED ORDER — ONDANSETRON HCL 4 MG/2ML IJ SOLN
4.0000 mg | Freq: Four times a day (QID) | INTRAMUSCULAR | Status: DC | PRN
  Administered 2019-07-28: 4 mg via INTRAVENOUS
  Filled 2019-07-28: qty 2

## 2019-07-28 MED ORDER — OXYCODONE HCL 5 MG PO TABS
ORAL_TABLET | ORAL | Status: AC
  Filled 2019-07-28: qty 1

## 2019-07-28 MED ORDER — DEXAMETHASONE SODIUM PHOSPHATE 10 MG/ML IJ SOLN
INTRAMUSCULAR | Status: DC | PRN
  Administered 2019-07-28: 5 mg via INTRAVENOUS

## 2019-07-28 MED ORDER — CEFAZOLIN SODIUM-DEXTROSE 2-4 GM/100ML-% IV SOLN
2.0000 g | INTRAVENOUS | Status: AC
  Administered 2019-07-28: 1 g via INTRAVENOUS
  Administered 2019-07-28: 2 g via INTRAVENOUS
  Filled 2019-07-28: qty 100

## 2019-07-28 MED ORDER — ASPIRIN EC 81 MG PO TBEC: 81.0000 mg | DELAYED_RELEASE_TABLET | Freq: Every day | ORAL | 0 refills | Status: AC

## 2019-07-28 MED ORDER — BUPIVACAINE HCL (PF) 0.25 % IJ SOLN
INTRAMUSCULAR | Status: DC | PRN
  Administered 2019-07-28: 40 mL

## 2019-07-28 MED ORDER — LACTATED RINGERS IV SOLN: INTRAVENOUS | Status: AC

## 2019-07-28 MED ORDER — OXYCODONE HCL 5 MG PO TABS: 5.0000 mg | ORAL_TABLET | ORAL | 0 refills | Status: DC | PRN

## 2019-07-28 MED ORDER — MORPHINE SULFATE (PF) 4 MG/ML IV SOLN
INTRAVENOUS | Status: DC | PRN
  Administered 2019-07-28: 8 mg via SUBCUTANEOUS

## 2019-07-28 MED ORDER — DEXAMETHASONE SODIUM PHOSPHATE 10 MG/ML IJ SOLN
INTRAMUSCULAR | Status: AC
  Filled 2019-07-28: qty 1

## 2019-07-28 MED ORDER — ACETAMINOPHEN 325 MG PO TABS: 325.0000 mg | ORAL_TABLET | Freq: Four times a day (QID) | ORAL | Status: DC | PRN

## 2019-07-28 MED ORDER — CARVEDILOL 25 MG PO TABS: 25.0000 mg | ORAL_TABLET | Freq: Two times a day (BID) | ORAL | Status: DC

## 2019-07-28 MED ORDER — ACETAMINOPHEN 500 MG PO TABS
1000.0000 mg | ORAL_TABLET | Freq: Once | ORAL | Status: AC
  Administered 2019-07-28: 1000 mg via ORAL
  Filled 2019-07-28: qty 2

## 2019-07-28 MED ORDER — FENTANYL CITRATE (PF) 250 MCG/5ML IJ SOLN
INTRAMUSCULAR | Status: DC | PRN
  Administered 2019-07-28: 25 ug via INTRAVENOUS
  Administered 2019-07-28: 200 ug via INTRAVENOUS
  Administered 2019-07-28 (×2): 50 ug via INTRAVENOUS
  Administered 2019-07-28: 25 ug via INTRAVENOUS

## 2019-07-28 MED ORDER — EPINEPHRINE PF 1 MG/ML IJ SOLN
INTRAMUSCULAR | Status: AC
  Filled 2019-07-28: qty 1

## 2019-07-28 MED ORDER — OXYCODONE HCL 5 MG PO TABS
5.0000 mg | ORAL_TABLET | Freq: Once | ORAL | Status: AC | PRN
  Administered 2019-07-28: 5 mg via ORAL

## 2019-07-28 MED ORDER — SODIUM CHLORIDE 0.9 % IR SOLN
Status: DC | PRN
  Administered 2019-07-28: 7000 mL

## 2019-07-28 MED ORDER — OXYCODONE HCL 5 MG PO TABS
5.0000 mg | ORAL_TABLET | ORAL | Status: DC | PRN
  Administered 2019-07-28: 23:00:00 10 mg via ORAL
  Administered 2019-07-28: 18:00:00 5 mg via ORAL
  Administered 2019-07-29 (×2): 10 mg via ORAL
  Filled 2019-07-28 (×3): qty 2

## 2019-07-28 MED ORDER — ATORVASTATIN CALCIUM 10 MG PO TABS: 20.0000 mg | ORAL_TABLET | Freq: Every day | ORAL | Status: DC

## 2019-07-28 MED ORDER — EPHEDRINE SULFATE-NACL 50-0.9 MG/10ML-% IV SOSY
PREFILLED_SYRINGE | INTRAVENOUS | Status: DC | PRN
  Administered 2019-07-28: 15 mg via INTRAVENOUS
  Administered 2019-07-28 (×3): 10 mg via INTRAVENOUS
  Administered 2019-07-28: 5 mg via INTRAVENOUS

## 2019-07-28 MED ORDER — LIDOCAINE 2% (20 MG/ML) 5 ML SYRINGE
INTRAMUSCULAR | Status: AC
  Filled 2019-07-28: qty 5

## 2019-07-28 MED ORDER — FENTANYL CITRATE (PF) 250 MCG/5ML IJ SOLN
INTRAMUSCULAR | Status: AC
  Filled 2019-07-28: qty 5

## 2019-07-28 MED ORDER — LACTATED RINGERS IV SOLN
INTRAVENOUS | Status: DC
  Administered 2019-07-28 (×2): via INTRAVENOUS

## 2019-07-28 MED ORDER — METHOCARBAMOL 500 MG PO TABS
500.0000 mg | ORAL_TABLET | Freq: Four times a day (QID) | ORAL | Status: DC | PRN
  Administered 2019-07-28 – 2019-07-29 (×4): 500 mg via ORAL
  Filled 2019-07-28 (×3): qty 1

## 2019-07-28 MED ORDER — ROCURONIUM BROMIDE 10 MG/ML (PF) SYRINGE
PREFILLED_SYRINGE | INTRAVENOUS | Status: AC
  Filled 2019-07-28: qty 10

## 2019-07-28 MED ORDER — HYDROMORPHONE HCL 1 MG/ML IJ SOLN
0.5000 mg | INTRAMUSCULAR | Status: DC | PRN
  Administered 2019-07-28 – 2019-07-29 (×3): 0.5 mg via INTRAVENOUS
  Filled 2019-07-28 (×2): qty 1

## 2019-07-28 MED ORDER — PROPOFOL 10 MG/ML IV BOLUS
INTRAVENOUS | Status: DC | PRN
  Administered 2019-07-28: 200 mg via INTRAVENOUS

## 2019-07-28 MED ORDER — METHOCARBAMOL 1000 MG/10ML IJ SOLN
500.0000 mg | Freq: Four times a day (QID) | INTRAVENOUS | Status: DC | PRN
  Filled 2019-07-28: qty 5

## 2019-07-28 MED ORDER — SUGAMMADEX SODIUM 200 MG/2ML IV SOLN
INTRAVENOUS | Status: DC | PRN
  Administered 2019-07-28: 200 mg via INTRAVENOUS

## 2019-07-28 MED ORDER — PROPOFOL 10 MG/ML IV BOLUS
INTRAVENOUS | Status: AC
  Filled 2019-07-28: qty 20

## 2019-07-28 MED ORDER — OXYCODONE HCL 5 MG/5ML PO SOLN: 5.0000 mg | Freq: Once | ORAL | Status: AC | PRN

## 2019-07-28 MED ORDER — TRAMADOL HCL 50 MG PO TABS: 50.0000 mg | ORAL_TABLET | Freq: Four times a day (QID) | ORAL | Status: DC

## 2019-07-28 MED ORDER — ROCURONIUM BROMIDE 10 MG/ML (PF) SYRINGE
PREFILLED_SYRINGE | INTRAVENOUS | Status: DC | PRN
  Administered 2019-07-28: 20 mg via INTRAVENOUS

## 2019-07-28 MED ORDER — MIDAZOLAM HCL 2 MG/2ML IJ SOLN
INTRAMUSCULAR | Status: DC | PRN
  Administered 2019-07-28: 2 mg via INTRAVENOUS

## 2019-07-28 MED ORDER — LIDOCAINE 2% (20 MG/ML) 5 ML SYRINGE
INTRAMUSCULAR | Status: DC | PRN
  Administered 2019-07-28: 60 mg via INTRAVENOUS

## 2019-07-28 MED ORDER — PHENYLEPHRINE 40 MCG/ML (10ML) SYRINGE FOR IV PUSH (FOR BLOOD PRESSURE SUPPORT)
PREFILLED_SYRINGE | INTRAVENOUS | Status: AC
  Filled 2019-07-28: qty 10

## 2019-07-28 MED ORDER — PHENYLEPHRINE 40 MCG/ML (10ML) SYRINGE FOR IV PUSH (FOR BLOOD PRESSURE SUPPORT)
PREFILLED_SYRINGE | INTRAVENOUS | Status: DC | PRN
  Administered 2019-07-28: 120 ug via INTRAVENOUS
  Administered 2019-07-28: 40 ug via INTRAVENOUS
  Administered 2019-07-28 (×2): 120 ug via INTRAVENOUS

## 2019-07-28 SURGICAL SUPPLY — 76 items
AID PSTN UNV HD RSTRNT DISP (MISCELLANEOUS) ×1
ANCH SUT FBRTK 1.3 2 TPE (Anchor) ×1 IMPLANT
ANCH SUT SWLK 19.1X4.75 (Anchor) ×2 IMPLANT
ANCHOR FBRTK 2.6 SUTURETAP 1.3 (Anchor) ×1 IMPLANT
ANCHOR SUT 1.8 FBRTK KNTLS 2SU (Anchor) ×2 IMPLANT
ANCHOR SUT BIO SW 4.75X19.1 (Anchor) ×2 IMPLANT
BLADE AVERAGE 25X9 (BLADE) ×1 IMPLANT
BLADE EXCALIBUR 4.0X13 (MISCELLANEOUS) ×2 IMPLANT
BLADE SURG 11 STRL SS (BLADE) ×1 IMPLANT
BURR OVAL 8 FLU 4.0X13 (MISCELLANEOUS) ×1 IMPLANT
COVER SURGICAL LIGHT HANDLE (MISCELLANEOUS) ×2 IMPLANT
COVER WAND RF STERILE (DRAPES) IMPLANT
DRAPE INCISE IOBAN 66X45 STRL (DRAPES) ×4 IMPLANT
DRAPE STERI 35X30 U-POUCH (DRAPES) ×2 IMPLANT
DRAPE U-SHAPE 47X51 STRL (DRAPES) ×4 IMPLANT
DRSG AQUACEL AG ADV 3.5X 4 (GAUZE/BANDAGES/DRESSINGS) ×2 IMPLANT
DRSG TEGADERM 4X4.75 (GAUZE/BANDAGES/DRESSINGS) ×8 IMPLANT
DRSG XEROFORM 1X8 (GAUZE/BANDAGES/DRESSINGS) ×1 IMPLANT
DURAPREP 26ML APPLICATOR (WOUND CARE) ×3 IMPLANT
DW OUTFLOW CASSETTE/TUBE SET (MISCELLANEOUS) ×2 IMPLANT
ELECT REM PT RETURN 9FT ADLT (ELECTROSURGICAL) ×2
ELECTRODE REM PT RTRN 9FT ADLT (ELECTROSURGICAL) ×1 IMPLANT
GAUZE SPONGE 4X4 12PLY STRL (GAUZE/BANDAGES/DRESSINGS) ×2 IMPLANT
GAUZE SPONGE 4X4 12PLY STRL LF (GAUZE/BANDAGES/DRESSINGS) ×3 IMPLANT
GLOVE BIOGEL PI IND STRL 7.0 (GLOVE) ×1 IMPLANT
GLOVE BIOGEL PI IND STRL 8 (GLOVE) ×1 IMPLANT
GLOVE BIOGEL PI INDICATOR 7.0 (GLOVE) ×1
GLOVE BIOGEL PI INDICATOR 8 (GLOVE) ×1
GLOVE ECLIPSE 7.0 STRL STRAW (GLOVE) ×2 IMPLANT
GLOVE ECLIPSE 8.0 STRL XLNG CF (GLOVE) ×2 IMPLANT
GOWN STRL REUS W/ TWL LRG LVL3 (GOWN DISPOSABLE) ×3 IMPLANT
GOWN STRL REUS W/TWL LRG LVL3 (GOWN DISPOSABLE) ×6
HOLDER TRACH TUBE VELCRO 19.5 (MISCELLANEOUS) ×1 IMPLANT
KIT BASIN OR (CUSTOM PROCEDURE TRAY) ×2 IMPLANT
KIT STR SPEAR 1.8 FBRTK DISP (KITS) ×1 IMPLANT
KIT TURNOVER KIT B (KITS) ×2 IMPLANT
MANIFOLD NEPTUNE II (INSTRUMENTS) ×2 IMPLANT
NDL SCORPION MULTI FIRE (NEEDLE) IMPLANT
NDL SPNL 18GX3.5 QUINCKE PK (NEEDLE) ×1 IMPLANT
NDL SUT 6 .5 CRC .975X.05 MAYO (NEEDLE) IMPLANT
NDL TAPERED W/ NITINOL LOOP (MISCELLANEOUS) IMPLANT
NEEDLE MAYO TAPER (NEEDLE)
NEEDLE SCORPION MULTI FIRE (NEEDLE) ×2 IMPLANT
NEEDLE SPNL 18GX3.5 QUINCKE PK (NEEDLE) ×2 IMPLANT
NEEDLE TAPERED W/ NITINOL LOOP (MISCELLANEOUS) ×2 IMPLANT
NS IRRIG 1000ML POUR BTL (IV SOLUTION) ×2 IMPLANT
PACK SHOULDER (CUSTOM PROCEDURE TRAY) ×2 IMPLANT
PAD ARMBOARD 7.5X6 YLW CONV (MISCELLANEOUS) ×4 IMPLANT
PORT APPOLLO RF 90DEGREE MULTI (SURGICAL WAND) ×1 IMPLANT
RESTRAINT HEAD UNIVERSAL NS (MISCELLANEOUS) ×2 IMPLANT
SLING ARM IMMOBILIZER LRG (SOFTGOODS) IMPLANT
SLING ARM IMMOBILIZER XL (CAST SUPPLIES) ×1 IMPLANT
SPONGE LAP 4X18 RFD (DISPOSABLE) ×3 IMPLANT
STRIP CLOSURE SKIN 1/2X4 (GAUZE/BANDAGES/DRESSINGS) ×2 IMPLANT
SUCTION FRAZIER HANDLE 10FR (MISCELLANEOUS) ×1
SUCTION TUBE FRAZIER 10FR DISP (MISCELLANEOUS) ×1 IMPLANT
SUT ETHILON 3 0 PS 1 (SUTURE) ×3 IMPLANT
SUT FIBERWIRE #2 38 T-5 BLUE (SUTURE)
SUT FIBERWIRE 2-0 18 17.9 3/8 (SUTURE) ×4
SUT MNCRL AB 3-0 PS2 18 (SUTURE) ×2 IMPLANT
SUT VIC AB 0 CT1 27 (SUTURE) ×4
SUT VIC AB 0 CT1 27XBRD ANBCTR (SUTURE) ×1 IMPLANT
SUT VIC AB 1 CT1 27 (SUTURE) ×6
SUT VIC AB 1 CT1 27XBRD ANBCTR (SUTURE) IMPLANT
SUT VIC AB 2-0 CT1 27 (SUTURE) ×4
SUT VIC AB 2-0 CT1 TAPERPNT 27 (SUTURE) ×1 IMPLANT
SUT VICRYL 0 UR6 27IN ABS (SUTURE) ×3 IMPLANT
SUT VICRYL 1 TIES 12X18 (SUTURE) ×1 IMPLANT
SUTURE FIBERWR #2 38 T-5 BLUE (SUTURE) IMPLANT
SUTURE FIBERWR 2-0 18 17.9 3/8 (SUTURE) IMPLANT
SUTURE TAPE 1.3 FIBERLOP 20 ST (SUTURE) IMPLANT
SUTURETAPE 1.3 FIBERLOOP 20 ST (SUTURE)
TOWEL GREEN STERILE (TOWEL DISPOSABLE) ×2 IMPLANT
TOWEL GREEN STERILE FF (TOWEL DISPOSABLE) ×2 IMPLANT
TUBING ARTHROSCOPY IRRIG 16FT (MISCELLANEOUS) ×2 IMPLANT
WATER STERILE IRR 1000ML POUR (IV SOLUTION) ×2 IMPLANT

## 2019-07-28 NOTE — Progress Notes (Signed)
Patient checked into 5N. Received report from [place], PACU RN. Full set of vitals taken and orders carried out. Shift change, so I will pass on necessary information to the next nurse. Will continue to to monitor.

## 2019-07-28 NOTE — H&P (Signed)
[name] is an 48 y.o. female.   Chief Complaint: Right shoulder pain HPI: [name] is a 48 year old patient with right shoulder pain.  Has had a long history of right shoulder pain which is been only temporarily relieved with conservative treatment.  MRI scan shows near full-thickness supraspinatus tear along with [facility] joint edema.  Presents now for operative management after explanation risk benefits and failure of conservative management.  Past Medical History:  Diagnosis Date  . Anxiety   . Arthritis   . Asthma   . Chronic back pain   . Chronic chest pain    resolved, no problems since 2019 per patient 07/27/19  . COPD (chronic obstructive pulmonary disease) ([place])   . Depression   . DM (diabetes mellitus) ([place])    INSULIN DEPENDENT - TYPE 1  . GERD (gastroesophageal reflux disease)   . Headache(784.0)   . Hyperlipidemia    no med, diet controlled  . Hypertension   . Hypokalemia   . Respiratory disease 05/2017  . Sleep apnea    does not use CPAP due to trach  . Tracheostomy in place [facility]) 02/2017    Past Surgical History:  Procedure Laterality Date  . ABDOMINAL HYSTERECTOMY  2009  . APPENDECTOMY    . CESAREAN SECTION     x 3  . CHOLECYSTECTOMY N/A 03/02/2014   Procedure: LAPAROSCOPIC CHOLECYSTECTOMY;  Surgeon: Joyice Faster. Cornett, MD;  Location: [place];  Service: General;  Laterality: N/A;  . HERNIA REPAIR    . PANENDOSCOPY N/A 03/04/2017   Procedure: PANENDOSCOPY WITH POSSIBLE FOREIGN BODY REMOVAL;  Surgeon: Jodi Marble, MD;  Location: [facility];  Service: ENT;  Laterality: N/A;  . ROTATOR CUFF REPAIR Left   . TRACHEOSTOMY  02/2017  . VESICOVAGINAL FISTULA CLOSURE W/ TAH  2009    Family History  Problem Relation Age of Onset  . Heart attack Mother   . Stroke Mother   . Diabetes Mother   . Hypertension Mother   . Arthritis Mother   . Stroke Father   . Hypertension Sister   . Diabetes Sister   . Seizures Brother   . Diabetes Brother    Social History:   reports that she quit smoking about 2 years ago. Her smoking use included cigarettes. She has a 5.50 pack-year smoking history. She has never used smokeless tobacco. She reports current alcohol use. She reports current drug use. Drug: Marijuana.  Allergies:  Allergies  Allergen Reactions  . Reglan [Metoclopramide] Other (See Comments)    Panic attack    Medications Prior to Admission  Medication Sig Dispense Refill  . albuterol (PROAIR HFA) 108 (90 Base) MCG/ACT inhaler Inhale 2 puffs into the lungs every 6 (six) hours as needed for wheezing or shortness of breath. 1 Inhaler 3  . albuterol (PROVENTIL) (2.5 MG/3ML) 0.083% nebulizer solution Take 3 mLs (2.5 mg total) by nebulization every 6 (six) hours as needed for wheezing or shortness of breath. 75 mL 3  . amLODipine (NORVASC) 10 MG tablet Take 1 tablet (10 mg total) by mouth daily. 90 tablet 1  . atorvastatin (LIPITOR) 20 MG tablet Take 1 tablet (20 mg total) by mouth daily. 90 tablet 1  . budesonide-formoterol (SYMBICORT) 160-4.5 MCG/ACT inhaler Inhale 2 puffs into the lungs 2 (two) times daily. 3 Inhaler 1  . buPROPion (WELLBUTRIN SR) 150 MG 12 hr tablet Take 1 tablet (150 mg total) by mouth 2 (two) times daily. 60 tablet 3  . carvedilol (COREG) 25 MG tablet Take  1 tablet (25 mg total) by mouth 2 (two) times daily with a meal. 180 tablet 1  . cyclobenzaprine (FLEXERIL) 10 MG tablet Take 1 tablet (10 mg total) by mouth 3 (three) times daily as needed for muscle spasms. 30 tablet 0  . furosemide (LASIX) 20 MG tablet Take 1 tablet (20 mg total) by mouth daily. 30 tablet 2  . gabapentin (NEURONTIN) 300 MG capsule Take 2 capsules (600 mg total) by mouth 2 (two) times daily. 360 capsule 1  . hydrOXYzine (ATARAX/VISTARIL) 25 MG tablet Take 1 tablet (25 mg total) by mouth 3 (three) times daily as needed for anxiety. 60 tablet 1  . Insulin Degludec (TRESIBA FLEXTOUCH) 200 UNIT/ML SOPN Inject 140 Units into the skin daily. 12 pen 11  . lisinopril  (ZESTRIL) 10 MG tablet Take 1 tablet (10 mg total) by mouth daily. 90 tablet 1  . Melatonin 3 MG TABS Take 3 mg by mouth at bedtime as needed (for sleep).     . montelukast (SINGULAIR) 10 MG tablet Take 1 tablet (10 mg total) by mouth at bedtime. 90 tablet 1  . Semaglutide, 1 MG/DOSE, (OZEMPIC, 1 MG/DOSE,) 2 MG/1.5ML SOPN Inject 1 mg into the skin once a week. 2 pen 11  . sertraline (ZOLOFT) 100 MG tablet Take 1 tablet (100 mg total) by mouth at bedtime. 90 tablet 1  . SUMAtriptan (IMITREX) 100 MG tablet At the onset of a migraine; may repeat in 2 hours. Max daily dose 226m 20 tablet 1  . tiZANidine (ZANAFLEX) 4 MG tablet Take 1 tablet (4 mg total) by mouth every 8 (eight) hours as needed for muscle spasms. 90 tablet 3  . aspirin-acetaminophen-caffeine (EXCEDRIN MIGRAINE) 250-250-65 MG tablet Take 2 tablets by mouth every 6 (six) hours as needed for headache.    . Blood Glucose Monitoring Suppl (ONETOUCH VERIO) w/Device KIT 1 each by Does not apply route daily. Use to monitor glucose levels daily; E11.9 1 kit 0  . glucose blood (ONETOUCH VERIO) test strip Use to monitor glucose levels daily; E11.9 100 each 2  . Insulin Pen Needle (FIFTY50 PEN NEEDLES) 31G X 8 MM MISC Inject 1 Units as directed daily.    . naproxen (NAPROSYN) 500 MG tablet Take 1 tablet (500 mg total) by mouth 2 (two) times daily with a meal. 60 tablet 0  . OneTouch Delica Lancets 389HMISC 1 each by Does not apply route daily. Use to monitor glucose levels daily; E11.9 100 each 2    Results for orders placed or performed during the hospital encounter of 07/28/19 (from the past 48 hour(s))  Glucose, capillary     Status: Abnormal   Collection Time: 07/28/19 10:05 AM  Result Value Ref Range   Glucose-Capillary 142 (H) 70 - 99 mg/dL  CBC     Status: Abnormal   Collection Time: 07/28/19 10:45 AM  Result Value Ref Range   WBC 8.6 4.0 - 10.5 K/uL   RBC 4.18 3.87 - 5.11 MIL/uL   Hemoglobin 11.7 (L) 12.0 - 15.0 g/dL   HCT 36.4 36.0  - 46.0 %   MCV 87.1 80.0 - 100.0 fL   MCH 28.0 26.0 - 34.0 pg   MCHC 32.1 30.0 - 36.0 g/dL   RDW 13.6 11.5 - 15.5 %   Platelets 403 (H) 150 - 400 K/uL   nRBC 0.0 0.0 - 0.2 %    Comment: Performed at [facility] [address] [place] [id number] Basic metabolic panel  Status: Abnormal   Collection Time: 07/28/19 10:45 AM  Result Value Ref Range   Sodium 137 135 - 145 mmol/L   Potassium 3.7 3.5 - 5.1 mmol/L   Chloride 105 98 - 111 mmol/L   CO2 21 (L) 22 - 32 mmol/L   Glucose, Bld 130 (H) 70 - 99 mg/dL   BUN 9 6 - 20 mg/dL   Creatinine, Ser 0.99 0.44 - 1.00 mg/dL   Calcium 9.1 8.9 - 10.3 mg/dL   GFR calc non Af Amer >60 >60 mL/min   GFR calc Af Amer >60 >60 mL/min   Anion gap 11 5 - 15    Comment: Performed at [facility], [place] [address], [place], [place] [address]   No results found.  Review of Systems  Musculoskeletal: Positive for joint pain.  All other systems reviewed and are negative.   Blood pressure (!) 134/93, pulse 92, temperature 98.7 F (37.1 C), temperature source Oral, resp. rate 18, height 5' 1"  (1.549 m), weight 111.1 kg, SpO2 100 %. Physical Exam  Constitutional: She appears well-developed.  HENT:  Head: Normocephalic.  Eyes: Pupils are equal, round, and reactive to light.  Neck: No thyromegaly present.  Cardiovascular: Normal rate.  Respiratory: Effort normal.  Neurological: She is alert.  Skin: Skin is warm.  Psychiatric: She has a normal mood and affect.  Examination of the right shoulder demonstrates AC joint tenderness.  She also has a little weakness on supraspinatus testing but no restriction of passive range of motion.  Motor sensory function of the hand is intact.  Assessment/Plan Impression is right shoulder pain with AC joint arthritis which is symptomatic as well as likely near full-thickness rotator cuff tear of the supraspinatus.  Plan is arthroscopy with examination of the biceps tendon and rotator cuff.  Also  plan to do arthroscopic versus open distal clavicle excision.  Sometimes the body habitus prevents arthroscopic excision.  In that case we will proceed with open excision as well as mini open rotator cuff tear repair.  Plan to use CPM and seen postop.  Risk benefits are discussed include not limited to infection nerve vessel damage shoulder stiffness incomplete pain relief as well as potential for more surgery.  All questions answered  Anderson Malta, MD 07/28/2019, 11:52 AM

## 2019-07-28 NOTE — Brief Op Note (Signed)
   07/28/2019  3:12 PM  PATIENT:  [name]  48 y.o. female  PRE-OPERATIVE DIAGNOSIS: Right shoulder rotator cuff tear superior labral degeneration and AC joint arthritis  POST-OPERATIVE DIAGNOSIS:  right shoulder rotator cuff tear, acromioclavicular osteoarthritis and superior labral degeneration  PROCEDURE:  Procedure(s): RIGHT SHOULDER ARTHROSCOP, MINI OPEN ROTATOR CUFF TEAR REPAIR,  BICEPS TENODESIS, DISTAL CLAVICLE EXCISION Superior labral debridement SURGEON:  Surgeon(s): Marlou Sa, Tonna Corner, MD  ASSISTANT: Magnant PA  ANESTHESIA:   general  EBL: 25 ml    Total I/O In: 1000 [I.V.:1000] Out: 50 [Blood:50]  BLOOD ADMINISTERED: none  DRAINS: none   LOCAL MEDICATIONS USED:  none  SPECIMEN:  No Specimen  COUNTS:  YES  TOURNIQUET:  * No tourniquets in log *  DICTATION: .Other Dictation: Dictation Number [id number]  PLAN OF CARE: Discharge to home after PACU  PATIENT DISPOSITION:  PACU - hemodynamically stable

## 2019-07-28 NOTE — Anesthesia Procedure Notes (Addendum)
Date/Time: 07/28/2019 1:26 PM Performed by: Kathryne Hitch, CRNA Pre-anesthesia Checklist: Patient identified, Emergency Drugs available, Suction available and Patient being monitored Patient Re-evaluated:Patient Re-evaluated prior to induction Oxygen Delivery Method: Circle system utilized Preoxygenation: Pre-oxygenation with 100% oxygen Induction Type: IV induction Tube type: Reinforced Tube size: 5.0 mm Number of attempts: 1 Airway Equipment and Method: Tracheostomy Placement Confirmation: breath sounds checked- equal and bilateral Tube secured with: Tape Dental Injury: Teeth and Oropharynx as per pre-operative assessment

## 2019-07-28 NOTE — Transfer of Care (Signed)
Immediate Anesthesia Transfer of Care Note  Patient: [name]  Procedure(s) Performed: RIGHT SHOULDER ARTHROSCOP, MINI OPEN ROTATOR CUFF TEAR REPAIR,  BICEPS TENODESIS, DISTAL CLAVICLE EXCISION (Right )  Patient Location: PACU  Anesthesia Type:General  Level of Consciousness: drowsy and patient cooperative  Airway & Oxygen Therapy: Patient Spontanous Breathing and Patient connected to nasal cannula oxygen  Post-op Assessment: Report given to RN and Post -op Vital signs reviewed and stable  Post vital signs: Reviewed and stable  Last Vitals:  Vitals Value Taken Time  BP 159/103 07/28/19 1601  Temp 36 C 07/28/19 1600  Pulse 84 07/28/19 1607  Resp 11 07/28/19 1607  SpO2 90 % 07/28/19 1607  Vitals shown include unvalidated device data.  Last Pain:  Vitals:   07/28/19 1053  TempSrc:   PainSc: 0-No pain         Complications: No apparent anesthesia complications

## 2019-07-28 NOTE — Progress Notes (Signed)
RN called to room due to pt vomiting. Blankets changed. Dr.Blackman paged for medication for nausea/vomiting. Zofran 45m IV, q6 prn ordered.

## 2019-07-28 NOTE — Anesthesia Postprocedure Evaluation (Signed)
Anesthesia Post Note  Patient: [name]  Procedure(s) Performed: RIGHT SHOULDER ARTHROSCOP, MINI OPEN ROTATOR CUFF TEAR REPAIR,  BICEPS TENODESIS, DISTAL CLAVICLE EXCISION (Right )     Patient location during evaluation: PACU Anesthesia Type: General Level of consciousness: awake Pain management: pain level controlled Vital Signs Assessment: post-procedure vital signs reviewed and stable Respiratory status: spontaneous breathing, nonlabored ventilation, respiratory function stable and patient connected to tracheostomy mask oxygen Cardiovascular status: blood pressure returned to baseline and stable Postop Assessment: no apparent nausea or vomiting Anesthetic complications: no    Last Vitals:  Vitals:   07/28/19 2025 07/28/19 2057  BP: (!) 148/96   Pulse: 78 76  Resp: 20 20  Temp: 37 C   SpO2: 98% 100%    Last Pain:  Vitals:   07/28/19 1843  TempSrc: Oral  PainSc:                  Ryan P Ellender

## 2019-07-29 ENCOUNTER — Encounter (HOSPITAL_COMMUNITY): Payer: Self-pay | Admitting: General Practice

## 2019-07-29 ENCOUNTER — Telehealth: Payer: Self-pay | Admitting: Orthopedic Surgery

## 2019-07-29 DIAGNOSIS — R69 Illness, unspecified: Secondary | ICD-10-CM | POA: Diagnosis not present

## 2019-07-29 DIAGNOSIS — M19011 Primary osteoarthritis, right shoulder: Secondary | ICD-10-CM | POA: Diagnosis not present

## 2019-07-29 DIAGNOSIS — J449 Chronic obstructive pulmonary disease, unspecified: Secondary | ICD-10-CM | POA: Diagnosis not present

## 2019-07-29 DIAGNOSIS — E785 Hyperlipidemia, unspecified: Secondary | ICD-10-CM | POA: Diagnosis not present

## 2019-07-29 DIAGNOSIS — K219 Gastro-esophageal reflux disease without esophagitis: Secondary | ICD-10-CM | POA: Diagnosis not present

## 2019-07-29 DIAGNOSIS — Z794 Long term (current) use of insulin: Secondary | ICD-10-CM | POA: Diagnosis not present

## 2019-07-29 DIAGNOSIS — E109 Type 1 diabetes mellitus without complications: Secondary | ICD-10-CM | POA: Diagnosis not present

## 2019-07-29 DIAGNOSIS — M75100 Unspecified rotator cuff tear or rupture of unspecified shoulder, not specified as traumatic: Secondary | ICD-10-CM | POA: Diagnosis not present

## 2019-07-29 DIAGNOSIS — M75101 Unspecified rotator cuff tear or rupture of right shoulder, not specified as traumatic: Secondary | ICD-10-CM | POA: Diagnosis not present

## 2019-07-29 MED ORDER — OXYCODONE HCL 5 MG PO TABS: 5.0000 mg | ORAL_TABLET | ORAL | 0 refills | Status: DC | PRN

## 2019-07-29 MED ORDER — VANCOMYCIN HCL IN DEXTROSE 1-5 GM/200ML-% IV SOLN
1000.0000 mg | Freq: Once | INTRAVENOUS | Status: AC
  Administered 2019-07-29: 12:00:00 1000 mg via INTRAVENOUS
  Filled 2019-07-29: qty 200

## 2019-07-29 MED ORDER — TRAMADOL HCL 50 MG PO TABS: 50.0000 mg | ORAL_TABLET | Freq: Two times a day (BID) | ORAL | 0 refills | Status: DC

## 2019-07-29 MED ORDER — ACETAMINOPHEN 325 MG PO TABS: 325.0000 mg | ORAL_TABLET | Freq: Four times a day (QID) | ORAL | 0 refills | Status: DC | PRN

## 2019-07-29 NOTE — Progress Notes (Signed)
  Subjective: Pt stable but having pain   Objective: Vital signs in last 24 hours: Temp:  [96.8 F (36 C)-98.7 F (37.1 C)] 98.3 F (36.8 C) (11/06 0758) Pulse Rate:  [75-92] 82 (11/06 0758) Resp:  [8-20] 18 (11/06 0758) BP: (114-159)/(77-104) 119/77 (11/06 0758) SpO2:  [90 %-100 %] 97 % (11/06 0758) FiO2 (%):  [28 %] 28 % (11/06 0511) Weight:  [111.1 kg] 111.1 kg (11/05 0958)  Intake/Output from previous day: 11/05 0701 - 11/06 0700 In: 1200 [I.V.:1200] Out: 480 [Urine:400; Emesis/NG output:30; Blood:50] Intake/Output this shift: No intake/output data recorded.  Exam:  No cellulitis present Compartment soft  Labs: Recent Labs    07/28/19 1045  HGB 11.7*   Recent Labs    07/28/19 1045  WBC 8.6  RBC 4.18  HCT 36.4  PLT 403*   Recent Labs    07/28/19 1045  NA 137  K 3.7  CL 105  CO2 21*  BUN 9  CREATININE 0.99  GLUCOSE 130*  CALCIUM 9.1   No results for input(s): LABPT, INR in the last 72 hours.  Assessment/Plan: Plan ot this am with dc today at 4pm ish - home with oxy and robaxin vanco for 1 dose post op h/o mrsa   G Alphonzo Severance 07/29/2019, 8:22 AM

## 2019-07-29 NOTE — Op Note (Signed)
NAME: SHIRELL, PELZ MEDICAL RECORD [id number] ACCOUNT [id number] DATE OF BIRTH:Nov 21, 1970 FACILITY: [facility] LOCATION: [facility] PHYSICIAN:Amani Nodarse Randel Pigg, MD  OPERATIVE REPORT  DATE OF PROCEDURE:  07/28/2019  PREOPERATIVE DIAGNOSES:   1.  Right shoulder rotator cuff tear.   2.  Acromioclavicular joint arthritis.   3.  Superior labral degeneration.  POSTOPERATIVE DIAGNOSES:   1.  Right shoulder rotator cuff tear.   2.  Acromioclavicular joint arthritis.   3.  Superior labral degeneration.  PROCEDURES:   1.  Right shoulder arthroscopy with biceps tendon release and limited debridement of superior labrum. 2.  Debridement of supraspinatus rotator cuff tear articular-sided. 3.  Open acromioclavicular joint distal clavicle excision and through a separate incision, mini open rotator cuff tear repair and biceps tenodesis.  SURGEON:  Meredith Pel, MD  ASSISTANT:  Annie Main, PA-C  INDICATIONS:  The patient  is a 48 year old patient with right shoulder pain refractory to nonoperative management.  MRI scan shows labral degeneration, along with rotator cuff tear and AC joint arthritis.  She has failed conservative management and  presents now for operative management after explanation of risks and benefits.  PROCEDURE IN DETAIL:  The patient was brought to the operating room where general endotracheal anesthesia was induced.  Preoperative antibiotics administered.  The endotracheal anesthesia was through an existing tracheostomy.  The patient was placed in  the beach chair position with the head in neutral position.  Right arm, shoulder and hand prescrubbed with alcohol and Betadine, allowed to air dry, prepped with DuraPrep solution and draped in a sterile manner.  Ioban used to cover the axilla.  Timeout  was called.  Posterior portal created 2 cm medial and inferior to the posterolateral margin of acromion.  Diagnostic arthroscopy was performed.  The patient had  superior labral degeneration with significant tearing and fraying of the biceps anchor.  The  patient also had a rotator cuff tear 90% thickness through the leading edge of the supraspinatus.  This was debrided.  The superior labrum was also debrided and the biceps tendon was released.  The anterior inferior and posterior inferior glenohumeral  ligaments were intact.  The shoulder joint was otherwise free of significant arthritis.  Following arthroscopy, which was facilitated also through with creation of an anterior portal, instruments were removed from the portals were then closed using 3-0  nylon.  Attention then directed towards the distal clavicle.  Ioban used to cover the operative field.  About a 3 cm incision made off the distal clavicle.  Skin and subcutaneous tissue were sharply divided.  Subperiosteal dissection was performed around  the distal end of the clavicle for about 1.5-2 cm.  The distal 1 cm of the clavicle was released and removed.  This gave excellent decompression of that [facility] joint space.  Following this, thorough irrigation was performed and then an epinephrine-soaked  sponge was placed and the distal end was placed within the incision.  Attention then directed towards the shoulder joint itself.  An incision made off the anterolateral margin of acromion.  Skin and subcutaneous tissue were sharply divided.  Deltoid was  split between the middle and anterior raphae and after this split was marked with #1 Vicryl suture about 4 cm distal to the anterolateral margin of the acromion.  At this time, the biceps was tenodesed in the bicipital groove  after opening up the  transverse humeral ligament on the medial side.  This was accomplished under appropriate tension using 2 knotless all fiber SutureTaks.  This gave excellent fixation.  Attention then directed towards the rotator cuff tear.  This was essentially an  A-shaped tear.  The footprint was prepared with debridement.  Degenerated  tissue was removed.   All in all, it was about a 1.5 x 1.5 cm tear.  Closure sutures were placed consisting of a 2-0 FiberWire suture.  One suture anchor placed at the apex of this  tear which at the greater tuberosity-humeral head junction.  The FiberWire sutures were then tied after passing the 4 limbs of the cuff sutures back after passing the 4 limbs of the tape through the rotator cuff.  The margin convergent sutures were tied  and their ends were incorporated into the repair.  The suture tapes were tied and crossed to obtain a watertight repair.  One suture limb of the margin convergence suture went with the anterior SwiveLock from Arthrex and the second one went with the  posterior SwiveLock from Arthrex.  All in all, an excellent watertight repair was achieved.  The joint was thoroughly irrigated.  Instruments were removed.  A subacromial decompression with bursectomy as well as rasping of the anterolateral margin of the  acromion was performed.  CA ligament was preserved.  At this time, thorough irrigation was performed and the deltoid split was closed using 0 Vicryl suture, followed by 2-0 Vicryl suture and a 3-0 Monocryl.  The periosteum was closed around the distal  clavicle also after irrigation using 0 Vicryl suture, 2-0 Vicryl suture and a 3-0 Monocryl.  Aquacel dressing was placed.  The patient was placed in a shoulder sling.  She tolerated the procedure well without immediate complication.  Luke's assistance  was required at all times during the case for retraction, mobilization of tissues, opening and closing.  His assistance was a medical necessity.  VN/NUANCE  D:07/28/2019 T:07/28/2019 JOB:[id number]

## 2019-07-29 NOTE — Evaluation (Signed)
Occupational Therapy Evaluation Patient Details Name: [name] MRN: [id number] DOB: 1971-03-06 Today's Date: 07/29/2019    History of Present Illness Pt admit with right shoulder pain and underwent Right shoulder rotator cuff tear superior labral degeneration and AC joint arthritis.    Clinical Impression   All education completed as per MD order with pt demonstrating understanding. No further OT needs.    Follow Up Recommendations  No OT follow up    Equipment Recommendations  None recommended by OT    Recommendations for Other Services       Precautions / Restrictions Precautions Precautions: Shoulder Type of Shoulder Precautions: active protocol Shoulder Interventions: Shoulder sling/immobilizer;Off for dressing/bathing/exercises Precaution Booklet Issued: Yes (comment) Required Braces or Orthoses: Sling Restrictions Weight Bearing Restrictions: Yes RUE Weight Bearing: Non weight bearing      Mobility Bed Mobility Overal bed mobility: Modified Independent                Transfers Overall transfer level: Independent Equipment used: None                  Balance Overall balance assessment: No apparent balance deficits (not formally assessed)                                         ADL either performed or assessed with clinical judgement   ADL Overall ADL's : At baseline                                       General ADL Comments: Educated in compensatory strategies for ADL, sling use, positioning R UE in bed and chair.     Vision Patient Visual Report: No change from baseline       Perception     Praxis      Pertinent Vitals/Pain Pain Assessment: Faces Faces Pain Scale: Hurts even more Pain Location: left shoulder  Pain Descriptors / Indicators: Grimacing;Guarding;Aching Pain Intervention(s): Monitored during session;Repositioned     Hand Dominance Right   Extremity/Trunk  Assessment Upper Extremity Assessment Upper Extremity Assessment: RUE deficits/detail RUE Deficits / Details: instructed in pendulum exercises, PROM, active ROM elbow to hand RUE Coordination: decreased gross motor   Lower Extremity Assessment Lower Extremity Assessment: Defer to PT evaluation   Cervical / Trunk Assessment Cervical / Trunk Assessment: Normal   Communication Communication Communication: Tracheostomy   Cognition Arousal/Alertness: Awake/alert Behavior During Therapy: WFL for tasks assessed/performed Overall Cognitive Status: Within Functional Limits for tasks assessed                                     General Comments       Exercises     Shoulder Instructions      Home Living Family/patient expects to be discharged to:: Private residence Living Arrangements: Spouse/significant other Available Help at Discharge: Family;Personal care attendant(PCS 3 days a week) Type of Home: House Home Access: Stairs to enter [employer] of Steps: 3 Entrance Stairs-Rails: Right Home Layout: Two level;Bed/bath upstairs;Full bath on main level Alternate Level Stairs-Number of Steps: 12 Alternate Level Stairs-Rails: Right;Left Bathroom Shower/Tub: Teacher, early years/pre: Standard     Home Equipment: None  Prior Functioning/Environment Level of Independence: Needs assistance  Gait / Transfers Assistance Needed: ambulating without AD, reports she has her "good days and bad days"  ADL's / Homemaking Assistance Needed: spouse and home care nurse assist with ADL PRN including tub transfers   Comments: spouse assist with iADL, pt reports spouse works but she has other family members, including daughter, who check in and stay with her as well         OT Problem List:        OT Treatment/Interventions:      OT Goals(Current goals can be found in the care plan section) Acute Rehab OT Goals Patient Stated Goal: to go home   OT Frequency:     Barriers to D/C:            Co-evaluation              AM-PAC OT "6 Clicks" Daily Activity     Outcome Measure Help from another person eating meals?: None Help from another person taking care of personal grooming?: None Help from another person toileting, which includes using toliet, bedpan, or urinal?: None Help from another person bathing (including washing, rinsing, drying)?: A Little Help from another person to put on and taking off regular upper body clothing?: A Little Help from another person to put on and taking off regular lower body clothing?: A Little 6 Click Score: 21   End of Session    Activity Tolerance: Patient tolerated treatment well Patient left: in chair;with call bell/phone within reach;with nursing/sitter in room  OT Visit Diagnosis: Pain                Time: 1530-1605 OT Time Calculation (min): 35 min Charges:  OT General Charges $OT Visit: 1 Visit OT Evaluation $OT Eval Moderate Complexity: 1 Mod OT Treatments $Self Care/Home Management : 8-22 mins  Nestor Lewandowsky, OTR/L Acute Rehabilitation Services Pager: [phone] Office: [phone]  [name] 07/29/2019, 4:11 PM

## 2019-07-29 NOTE — Telephone Encounter (Signed)
[name] left a voicemail stating that they have some questions about the Oxycodone 5mg .  CB#[phone].  Thank you.

## 2019-07-29 NOTE — Progress Notes (Signed)
Patient discharging home. Discharge instructions explained to patient and she verbalized understanding. Took all personal belonging. Trach intact. No SOB or resp distress noted. No further questions or concerns voiced.

## 2019-07-29 NOTE — Evaluation (Signed)
Physical Therapy Evaluation and D/C Patient Details Name: [name] MRN: [id number] DOB: [birth year] Today's Date: 07/29/2019   History of Present Illness  Pt admit with right shoulder pain and underwent Right shoulder rotator cuff tear superior labral degeneration and AC joint arthritis.   Clinical Impression  Pt admitted with above diagnosis. Pt was able to ambulate without assist and without LOB.  Pt states she is at baseline.  She does not need PT.  She did walk on 28% trach collar with sats >92% and was not on O2 before per pt. Will sign off and defer UE education to OT.   Follow Up Recommendations No PT follow up    Equipment Recommendations  None recommended by PT    Recommendations for Other Services       Precautions / Restrictions Precautions Precautions: Fall;Shoulder Shoulder Interventions: Shoulder sling/immobilizer;At all times Required Braces or Orthoses: Sling Restrictions Weight Bearing Restrictions: Yes RUE Weight Bearing: Non weight bearing      Mobility  Bed Mobility Overal bed mobility: Independent                Transfers Overall transfer level: Independent                  Ambulation/Gait Ambulation/Gait assistance: Supervision Gait Distance (Feet): 175 Feet Assistive device: None Gait Pattern/deviations: Step-through pattern;Decreased stride length   Gait velocity interpretation: <1.31 ft/sec, indicative of household ambulator General Gait Details: Pt feels she is at baseline with her ambulation.  No LOB.  Pt walked on 28% trach collar with sats >90%.   Stairs Stairs: (Pt states husband will help her she didnt want to practice.)          Wheelchair Mobility    Modified Rankin (Stroke Patients Only)       Balance Overall balance assessment: Needs assistance Sitting-balance support: No upper extremity supported;Feet supported Sitting balance-[name]: Fair     Standing balance support: No upper  extremity supported;During functional activity Standing balance-[name]: Fair                               Pertinent Vitals/Pain Pain Assessment: 0-10 Pain Score: 8  Pain Location: left shoulder  Pain Descriptors / Indicators: Grimacing;Guarding;Aching Pain Intervention(s): Limited activity within patient's tolerance;Monitored during session;Premedicated before session;Repositioned    Home Living Family/patient expects to be discharged to:: Private residence Living Arrangements: Spouse/significant other Available Help at Discharge: Family;Personal care attendant(3x week, 2-3 hours) Type of Home: House Home Access: Stairs to enter Entrance Stairs-Rails: Right Entrance Stairs-Number of Steps: 3 Home Layout: Two level;Bed/bath upstairs;Full bath on main level Home Equipment: None      Prior Function Level of Independence: Needs assistance   Gait / Transfers Assistance Needed: ambulating without AD, reports she has her "good days and bad days"   ADL's / Homemaking Assistance Needed: spouse and home care nurse assist with ADL PRN including tub transfers  Comments: spouse assist with iADL, pt reports spouse works but she has other family members, including daughter, who check in and stay with her as well      Hand Dominance   Dominant Hand: Right    Extremity/Trunk Assessment   Upper Extremity Assessment Upper Extremity Assessment: Defer to OT evaluation    Lower Extremity Assessment Lower Extremity Assessment: Generalized weakness    Cervical / Trunk Assessment Cervical / Trunk Assessment: Normal  Communication   Communication: Tracheostomy;Passy-Muir valve  Cognition Arousal/Alertness:  Awake/alert Behavior During Therapy: WFL for tasks assessed/performed Overall Cognitive Status: Within Functional Limits for tasks assessed                                        General Comments      Exercises     Assessment/Plan    PT  Assessment Patent does not need any further PT services  PT Problem List Decreased activity tolerance;Decreased balance;Decreased mobility;Decreased knowledge of use of DME;Decreased safety awareness;Decreased knowledge of precautions;Pain       PT Treatment Interventions DME instruction;Gait training;Functional mobility training;Therapeutic activities;Balance training;Patient/family education    PT Goals (Current goals can be found in the Care Plan section)  Acute Rehab PT Goals Patient Stated Goal: to go home PT Goal Formulation: All assessment and education complete, DC therapy    Frequency     Barriers to discharge        Co-evaluation               AM-PAC PT "6 Clicks" Mobility  Outcome Measure Help needed turning from your back to your side while in a flat bed without using bedrails?: None Help needed moving from lying on your back to sitting on the side of a flat bed without using bedrails?: None Help needed moving to and from a bed to a chair (including a wheelchair)?: None Help needed standing up from a chair using your arms (e.g., wheelchair or bedside chair)?: None Help needed to walk in hospital room?: None Help needed climbing 3-5 steps with a railing? : None 6 Click Score: 24    End of Session Equipment Utilized During Treatment: Gait belt;Oxygen(28% trach collar) Activity Tolerance: Patient tolerated treatment well Patient left: in chair;with call bell/phone within reach;with chair alarm set Nurse Communication: Mobility status PT Visit Diagnosis: Unsteadiness on feet (R26.81);Muscle weakness (generalized) (M62.81);Pain Pain - Right/Left: Right Pain - part of body: Shoulder    Time: [id number] PT Time Calculation (min) (ACUTE ONLY): 23 min   Charges:   PT Evaluation $PT Eval Low Complexity: 1 Low PT Treatments $Gait Training: 8-22 mins        Roderica Cathell W,PT Acute Rehabilitation Services Pager:  [phone]  Office:  [place] 07/29/2019, 10:37 AM

## 2019-07-29 NOTE — Telephone Encounter (Signed)
[facility] This has been addressed

## 2019-08-01 ENCOUNTER — Encounter (HOSPITAL_COMMUNITY): Payer: Self-pay | Admitting: Orthopedic Surgery

## 2019-08-01 DIAGNOSIS — J9500 Unspecified tracheostomy complication: Secondary | ICD-10-CM | POA: Diagnosis not present

## 2019-08-01 NOTE — Discharge Summary (Signed)
Physician Discharge Summary      Patient ID: [name] MRN: [id number] DOB/AGE: Jun 24, 1971 48 y.o.  Admit date: 07/28/2019 Discharge date: 07/29/2019  Admission Diagnoses:  Active Problems:   Rotator cuff tear   Discharge Diagnoses:  Same  Surgeries: Procedure(s): RIGHT SHOULDER ARTHROSCOP, MINI OPEN ROTATOR CUFF TEAR REPAIR,  BICEPS TENODESIS, DISTAL CLAVICLE EXCISION on 07/28/2019   Consultants:   Discharged Condition: Stable  Hospital Course: Jaidence Geisler Bradley is an 48 y.o. female who was admitted 07/28/2019 with a chief complaint of right shoulder pain, and found to have a diagnosis of right rotator cuff tear.  They were brought to the operating room on 07/28/2019 and underwent the above named procedures.  Pt awoke from anesthesia without complication and was transferred to PACU. In PACU, patient had difficulty with pain control and was admitted for postoperative pain control.  She was seen on POD1; she was stable but still having pain.  She was discharged home on POD1.  Pt will f/u with Dr. Marlou Sa in clinic in ~2 weeks.   Antibiotics given:  Anti-infectives (From admission, onward)   Start     Dose/Rate Route Frequency Ordered Stop   07/29/19 0830  vancomycin (VANCOCIN) IVPB 1000 mg/200 mL premix     1,000 mg 200 mL/hr over 60 Minutes Intravenous  Once 07/29/19 0825 07/29/19 1255   07/28/19 1030  ceFAZolin (ANCEF) IVPB 2g/100 mL premix     2 g 200 mL/hr over 30 Minutes Intravenous On call to O.R. 07/28/19 1019 07/28/19 1313    .  Recent vital signs:  Vitals:   07/29/19 1136 07/29/19 1456  BP:    Pulse: 83 (!) 109  Resp: 18 20  Temp:    SpO2: 100% 97%    Recent laboratory studies:  Results for orders placed or performed during the hospital encounter of 07/28/19  Surgical pcr screen   Specimen: Nasal Mucosa; Nasal Swab  Result Value Ref Range   MRSA, PCR NEGATIVE NEGATIVE   Staphylococcus aureus NEGATIVE NEGATIVE  Glucose, capillary   Result Value Ref Range   Glucose-Capillary 142 (H) 70 - 99 mg/dL  CBC  Result Value Ref Range   WBC 8.6 4.0 - 10.5 K/uL   RBC 4.18 3.87 - 5.11 MIL/uL   Hemoglobin 11.7 (L) 12.0 - 15.0 g/dL   HCT 36.4 36.0 - 46.0 %   MCV 87.1 80.0 - 100.0 fL   MCH 28.0 26.0 - 34.0 pg   MCHC 32.1 30.0 - 36.0 g/dL   RDW 13.6 11.5 - 15.5 %   Platelets 403 (H) 150 - 400 K/uL   nRBC 0.0 0.0 - 0.2 %  Basic metabolic panel  Result Value Ref Range   Sodium 137 135 - 145 mmol/L   Potassium 3.7 3.5 - 5.1 mmol/L   Chloride 105 98 - 111 mmol/L   CO2 21 (L) 22 - 32 mmol/L   Glucose, Bld 130 (H) 70 - 99 mg/dL   BUN 9 6 - 20 mg/dL   Creatinine, Ser 0.99 0.44 - 1.00 mg/dL   Calcium 9.1 8.9 - 10.3 mg/dL   GFR calc non Af Amer >60 >60 mL/min   GFR calc Af Amer >60 >60 mL/min   Anion gap 11 5 - 15  Glucose, capillary  Result Value Ref Range   Glucose-Capillary 106 (H) 70 - 99 mg/dL   Comment 1 Notify RN    Comment 2 Document in Chart   Glucose, capillary  Result Value Ref Range   Glucose-Capillary  132 (H) 70 - 99 mg/dL   Comment 1 Notify RN     Discharge Medications:   Allergies as of 07/29/2019      Reactions   Reglan [metoclopramide] Other (See Comments)   Panic attack      Medication List    STOP taking these medications   aspirin-acetaminophen-caffeine 250-250-65 MG tablet Commonly known as: EXCEDRIN MIGRAINE   cyclobenzaprine 10 MG tablet Commonly known as: FLEXERIL     TAKE these medications   acetaminophen 325 MG tablet Commonly known as: TYLENOL Take 1-2 tablets (325-650 mg total) by mouth every 6 (six) hours as needed for mild pain (pain score 1-3 or temp > 100.5).   albuterol 108 (90 Base) MCG/ACT inhaler Commonly known as: ProAir HFA Inhale 2 puffs into the lungs every 6 (six) hours as needed for wheezing or shortness of breath.   albuterol (2.5 MG/3ML) 0.083% nebulizer solution Commonly known as: PROVENTIL Take 3 mLs (2.5 mg total) by nebulization every 6 (six) hours as  needed for wheezing or shortness of breath.   amLODipine 10 MG tablet Commonly known as: NORVASC Take 1 tablet (10 mg total) by mouth daily.   aspirin EC 81 MG tablet Take 1 tablet (81 mg total) by mouth daily.   atorvastatin 20 MG tablet Commonly known as: LIPITOR Take 1 tablet (20 mg total) by mouth daily.   budesonide-formoterol 160-4.5 MCG/ACT inhaler Commonly known as: SYMBICORT Inhale 2 puffs into the lungs 2 (two) times daily.   buPROPion 150 MG 12 hr tablet Commonly known as: Wellbutrin SR Take 1 tablet (150 mg total) by mouth 2 (two) times daily.   carvedilol 25 MG tablet Commonly known as: COREG Take 1 tablet (25 mg total) by mouth 2 (two) times daily with a meal.   Fifty50 Pen Needles 31G X 8 MM Misc Generic drug: Insulin Pen Needle Inject 1 Units as directed daily.   furosemide 20 MG tablet Commonly known as: LASIX Take 1 tablet (20 mg total) by mouth daily.   gabapentin 300 MG capsule Commonly known as: NEURONTIN Take 2 capsules (600 mg total) by mouth 2 (two) times daily.   hydrOXYzine 25 MG tablet Commonly known as: ATARAX/VISTARIL Take 1 tablet (25 mg total) by mouth 3 (three) times daily as needed for anxiety.   lisinopril 10 MG tablet Commonly known as: ZESTRIL Take 1 tablet (10 mg total) by mouth daily.   Melatonin 3 MG Tabs Take 3 mg by mouth at bedtime as needed (for sleep).   montelukast 10 MG tablet Commonly known as: SINGULAIR Take 1 tablet (10 mg total) by mouth at bedtime.   naproxen 500 MG tablet Commonly known as: Naprosyn Take 1 tablet (500 mg total) by mouth 2 (two) times daily with a meal.   OneTouch Delica Lancets 44Y Misc 1 each by Does not apply route daily. Use to monitor glucose levels daily; E11.9   OneTouch Verio test strip Generic drug: glucose blood Use to monitor glucose levels daily; E11.9   OneTouch Verio w/Device Kit 1 each by Does not apply route daily. Use to monitor glucose levels daily; E11.9   oxyCODONE  5 MG immediate release tablet Commonly known as: Oxy IR/ROXICODONE Take 1-2 tablets (5-10 mg total) by mouth every 4 (four) hours as needed for moderate pain (pain score 4-6).   Ozempic (1 MG/DOSE) 2 MG/1.5ML Sopn Generic drug: Semaglutide (1 MG/DOSE) Inject 1 mg into the skin once a week.   sertraline 100 MG tablet Commonly known as: ZOLOFT  Take 1 tablet (100 mg total) by mouth at bedtime.   SUMAtriptan 100 MG tablet Commonly known as: IMITREX At the onset of a migraine; may repeat in 2 hours. Max daily dose 257m   tiZANidine 4 MG tablet Commonly known as: ZANAFLEX Take 1 tablet (4 mg total) by mouth every 8 (eight) hours as needed for muscle spasms.   traMADol 50 MG tablet Commonly known as: ULTRAM Take 1 tablet (50 mg total) by mouth 2 (two) times daily.   TTyler AasFlexTouch 200 UNIT/ML Sopn Generic drug: Insulin Degludec Inject 140 Units into the skin daily.       Diagnostic Studies: No results found.  Disposition:   Discharge Instructions    Call MD / Call 911   Complete by: As directed    If you experience chest pain or shortness of breath, CALL 911 and be transported to the hospital emergency room.  If you develope a fever above 101 F, pus (white drainage) or increased drainage or redness at the wound, or calf pain, call your surgeon's office.   Call MD / Call 911   Complete by: As directed    If you experience chest pain or shortness of breath, CALL 911 and be transported to the hospital emergency room.  If you develope a fever above 101 F, pus (white drainage) or increased drainage or redness at the wound, or calf pain, call your surgeon's office.   Constipation Prevention   Complete by: As directed    Drink plenty of fluids.  Prune juice may be helpful.  You may use a stool softener, such as Colace (over the counter) 100 mg twice a day.  Use MiraLax (over the counter) for constipation as needed.   Constipation Prevention   Complete by: As directed    Drink  plenty of fluids.  Prune juice may be helpful.  You may use a stool softener, such as Colace (over the counter) 100 mg twice a day.  Use MiraLax (over the counter) for constipation as needed.   Diet - low sodium heart healthy   Complete by: As directed    Diet - low sodium heart healthy   Complete by: As directed    Discharge instructions   Complete by: As directed    Use the CPM machine for one hour tonight.  Starting tomorrow, use the CPM machine 3 times per day for at least one hour each time.  No lifting with the right arm.  You may shower, dressings are waterproof.  Use the sling consistently, you may take it off for showering.  You may come out of the sling 1-2 times per day to work on straightening your elbow, to avoid elbow stiffness.  Follow-up with Dr. DMarlou Sain the clinic at your given appointment date in ~1 week.   Discharge instructions   Complete by: As directed    Use brace 1 hour 3 times a day No lifting with right arm Ok to shower dressing waterproof   Increase activity slowly as tolerated   Complete by: As directed    Increase activity slowly as tolerated   Complete by: As directed          Signed: CDonella Stade11/05/2019, 7:35 AM

## 2019-08-03 DIAGNOSIS — J9500 Unspecified tracheostomy complication: Secondary | ICD-10-CM | POA: Diagnosis not present

## 2019-08-04 DIAGNOSIS — Z930 Tracheostomy status: Secondary | ICD-10-CM | POA: Diagnosis not present

## 2019-08-04 DIAGNOSIS — J45909 Unspecified asthma, uncomplicated: Secondary | ICD-10-CM | POA: Diagnosis not present

## 2019-08-04 DIAGNOSIS — J9610 Chronic respiratory failure, unspecified whether with hypoxia or hypercapnia: Secondary | ICD-10-CM | POA: Diagnosis not present

## 2019-08-04 DIAGNOSIS — G4733 Obstructive sleep apnea (adult) (pediatric): Secondary | ICD-10-CM | POA: Diagnosis not present

## 2019-08-04 DIAGNOSIS — J449 Chronic obstructive pulmonary disease, unspecified: Secondary | ICD-10-CM | POA: Diagnosis not present

## 2019-08-04 DIAGNOSIS — J9500 Unspecified tracheostomy complication: Secondary | ICD-10-CM | POA: Diagnosis not present

## 2019-08-05 ENCOUNTER — Ambulatory Visit (INDEPENDENT_AMBULATORY_CARE_PROVIDER_SITE_OTHER): Payer: Medicare HMO | Admitting: Orthopedic Surgery

## 2019-08-05 ENCOUNTER — Other Ambulatory Visit: Payer: Self-pay

## 2019-08-05 ENCOUNTER — Encounter: Payer: Self-pay | Admitting: Orthopedic Surgery

## 2019-08-05 DIAGNOSIS — M19011 Primary osteoarthritis, right shoulder: Secondary | ICD-10-CM

## 2019-08-05 DIAGNOSIS — M75121 Complete rotator cuff tear or rupture of right shoulder, not specified as traumatic: Secondary | ICD-10-CM

## 2019-08-05 DIAGNOSIS — G8929 Other chronic pain: Secondary | ICD-10-CM

## 2019-08-05 DIAGNOSIS — M25511 Pain in right shoulder: Secondary | ICD-10-CM

## 2019-08-05 NOTE — Progress Notes (Signed)
Post-Op Visit Note   Patient: [name]           Date of Birth: 1971-05-21           MRN: [id number] Visit Date: 08/05/2019 PCP: [name], MD   Assessment & Plan:  Chief Complaint:  Chief Complaint  Patient presents with  . Right Shoulder - Routine Post Op   Visit Diagnoses:  1. Arthritis of right acromioclavicular joint   2. Chronic right shoulder pain   3. Nontraumatic complete tear of right rotator cuff     Plan: [name] is a patient with right shoulder surgery now 1 week out.  She had distal clavicle excision and rotator cuff repair and biceps tenodesis.  On exam she has decent passive range of motion.  She has been in the abduction brace CPM type machine on 60 degrees.  Plan at this time is to discontinue sutures.  Start physical therapy here 2 times a week for 6 weeks beginning next week for passive range of motion and deltoid isometrics.  For the first 6 weeks after surgery I really just want to go with passive range of motion.  Anticipate discontinuing the sling after next clinic visit.  No lifting with right shoulder.  Follow-Up Instructions: Return in about 2 weeks (around 08/19/2019).   Orders:  Orders Placed This Encounter  Procedures  . Ambulatory referral to Physical Therapy   No orders of the defined types were placed in this encounter.   Imaging: No results found.  PMFS History: Patient Active Problem List   Diagnosis Date Noted  . Rotator cuff tear 07/28/2019  . Diabetes ([place]) 04/12/2019  . Acute asthma exacerbation 03/22/2019  . Chest pain of uncertain etiology 123456  . Lactic acidosis 03/22/2019  . Chronic right shoulder pain   . Community acquired pneumonia 10/25/2018  . Asthma, chronic, unspecified asthma severity, with acute exacerbation 10/25/2018  . Acute respiratory failure with hypoxia and hypercapnia ([place]) 10/23/2018  . Acute pain of right shoulder due to trauma 10/05/2018  . Chronic bilateral low back pain  without sciatica 10/05/2018  . Dysphonia 10/05/2018  . Severe asthma with exacerbation 10/05/2018  . Rotator cuff arthropathy, right 06/15/2018  . AKI (acute kidney injury) ([place]) 03/06/2018  . Carpal tunnel syndrome 01/18/2018  . Normocytic anemia 05/06/2017  . Tracheostomy status ([place]) 04/28/2017  . Tracheostomy dependent ([place]) 03/26/2017  . Subglottic stenosis 03/06/2017  . Tracheal stenosis 03/04/2017  . Acute blood loss anemia   . Generalized anxiety disorder   . Hypoalbuminemia due to protein-calorie malnutrition ([place])   . Morbid obesity ([place])   . Chronic pain syndrome   . Gastritis and gastroduodenitis 02/22/2016  . Depression 02/22/2016  . Migraine 01/30/2015  . Bulging lumbar disc 09/05/2013  . Essential hypertension 09/05/2013  . Allergic rhinitis, seasonal 08/11/2012  . Chronic cough 08/11/2012  . Sleep apnea, obstructive 12/04/2011   Past Medical History:  Diagnosis Date  . Anxiety   . Arthritis   . Asthma   . Chronic back pain   . Chronic chest pain    resolved, no problems since 2019 per patient 07/27/19  . COPD (chronic obstructive pulmonary disease) ([place])   . Depression   . DM (diabetes mellitus) ([place])    INSULIN DEPENDENT - TYPE 1  . GERD (gastroesophageal reflux disease)   . Headache(784.0)   . Hyperlipidemia    no med, diet controlled  . Hypertension   . Hypokalemia   . Respiratory disease 05/2017  . Sleep  apnea    does not use CPAP due to trach  . Tracheostomy in place [facility]) 02/2017    Family History  Problem Relation Age of Onset  . Heart attack Mother   . Stroke Mother   . Diabetes Mother   . Hypertension Mother   . Arthritis Mother   . Stroke Father   . Hypertension Sister   . Diabetes Sister   . Seizures Brother   . Diabetes Brother     Past Surgical History:  Procedure Laterality Date  . ABDOMINAL HYSTERECTOMY  2009  . APPENDECTOMY    . CESAREAN SECTION     x 3  . CHOLECYSTECTOMY N/A 03/02/2014   Procedure: LAPAROSCOPIC  CHOLECYSTECTOMY;  Surgeon: Joyice Faster. Cornett, MD;  Location: [facility];  Service: General;  Laterality: N/A;  . HERNIA REPAIR    . PANENDOSCOPY N/A 03/04/2017   Procedure: PANENDOSCOPY WITH POSSIBLE FOREIGN BODY REMOVAL;  Surgeon: Jodi Marble, MD;  Location: [facility];  Service: ENT;  Laterality: N/A;  . ROTATOR CUFF REPAIR Left   . ROTATOR CUFF REPAIR Right 07/27/2019  . SHOULDER ARTHROSCOPY WITH SUBACROMIAL DECOMPRESSION, ROTATOR CUFF REPAIR AND BICEP TENDON REPAIR Right 07/28/2019   Procedure: RIGHT SHOULDER ARTHROSCOP, MINI OPEN ROTATOR CUFF TEAR REPAIR,  BICEPS TENODESIS, DISTAL CLAVICLE EXCISION;  Surgeon: Meredith Pel, MD;  Location: [facility];  Service: Orthopedics;  Laterality: Right;  . TRACHEOSTOMY  02/2017  . VESICOVAGINAL FISTULA CLOSURE W/ TAH  2009   Social History   Occupational History  . Not on file  Tobacco Use  . Smoking status: Former Smoker    Packs/day: 0.25    Years: 22.00    Pack years: 5.50    Types: Cigarettes    Quit date: 01/20/2017    Years since quitting: 2.5  . Smokeless tobacco: Never Used  Substance and Sexual Activity  . Alcohol use: Yes    Alcohol/week: 0.0 standard drinks    Comment: socially 1-2 glasses wine  . Drug use: Yes    Types: Marijuana    Comment: Last Use Jul 13, 2019  . Sexual activity: Yes    Partners: Male    Birth control/protection: None

## 2019-08-08 ENCOUNTER — Telehealth: Payer: Self-pay | Admitting: Orthopedic Surgery

## 2019-08-08 ENCOUNTER — Other Ambulatory Visit: Payer: Self-pay | Admitting: Surgical

## 2019-08-08 DIAGNOSIS — J9500 Unspecified tracheostomy complication: Secondary | ICD-10-CM | POA: Diagnosis not present

## 2019-08-08 MED ORDER — OXYCODONE HCL 5 MG PO TABS: 5.0000 mg | ORAL_TABLET | Freq: Four times a day (QID) | ORAL | 0 refills | Status: DC | PRN

## 2019-08-08 NOTE — Telephone Encounter (Signed)
Please advise. Thanks.  

## 2019-08-08 NOTE — Telephone Encounter (Signed)
Patient called left voicemail message needing Rx for her pain medicine refilled. The number to contact patient is [phone]

## 2019-08-09 NOTE — Telephone Encounter (Signed)
IC s/w patient advised submitted.

## 2019-08-10 ENCOUNTER — Ambulatory Visit: Payer: Medicare HMO | Admitting: Physical Therapy

## 2019-08-10 DIAGNOSIS — J9500 Unspecified tracheostomy complication: Secondary | ICD-10-CM | POA: Diagnosis not present

## 2019-08-11 DIAGNOSIS — J9500 Unspecified tracheostomy complication: Secondary | ICD-10-CM | POA: Diagnosis not present

## 2019-08-15 DIAGNOSIS — J9500 Unspecified tracheostomy complication: Secondary | ICD-10-CM | POA: Diagnosis not present

## 2019-08-16 DIAGNOSIS — Z87891 Personal history of nicotine dependence: Secondary | ICD-10-CM | POA: Diagnosis not present

## 2019-08-16 DIAGNOSIS — Z930 Tracheostomy status: Secondary | ICD-10-CM | POA: Diagnosis not present

## 2019-08-16 DIAGNOSIS — J384 Edema of larynx: Secondary | ICD-10-CM | POA: Diagnosis not present

## 2019-08-16 DIAGNOSIS — Z7951 Long term (current) use of inhaled steroids: Secondary | ICD-10-CM | POA: Diagnosis not present

## 2019-08-16 DIAGNOSIS — Z79899 Other long term (current) drug therapy: Secondary | ICD-10-CM | POA: Diagnosis not present

## 2019-08-16 DIAGNOSIS — J386 Stenosis of larynx: Secondary | ICD-10-CM | POA: Diagnosis not present

## 2019-08-17 DIAGNOSIS — J9500 Unspecified tracheostomy complication: Secondary | ICD-10-CM | POA: Diagnosis not present

## 2019-08-18 DIAGNOSIS — J9500 Unspecified tracheostomy complication: Secondary | ICD-10-CM | POA: Diagnosis not present

## 2019-08-22 ENCOUNTER — Ambulatory Visit (INDEPENDENT_AMBULATORY_CARE_PROVIDER_SITE_OTHER): Payer: Medicare HMO | Admitting: Orthopedic Surgery

## 2019-08-22 ENCOUNTER — Encounter: Payer: Self-pay | Admitting: Physical Therapy

## 2019-08-22 ENCOUNTER — Encounter: Payer: Self-pay | Admitting: Orthopedic Surgery

## 2019-08-22 ENCOUNTER — Ambulatory Visit: Payer: Medicare HMO | Attending: Orthopedic Surgery | Admitting: Physical Therapy

## 2019-08-22 ENCOUNTER — Other Ambulatory Visit: Payer: Self-pay

## 2019-08-22 ENCOUNTER — Other Ambulatory Visit: Payer: Self-pay | Admitting: Surgical

## 2019-08-22 DIAGNOSIS — M6281 Muscle weakness (generalized): Secondary | ICD-10-CM

## 2019-08-22 DIAGNOSIS — G8929 Other chronic pain: Secondary | ICD-10-CM | POA: Insufficient documentation

## 2019-08-22 DIAGNOSIS — M25511 Pain in right shoulder: Secondary | ICD-10-CM | POA: Diagnosis not present

## 2019-08-22 DIAGNOSIS — J9500 Unspecified tracheostomy complication: Secondary | ICD-10-CM | POA: Diagnosis not present

## 2019-08-22 DIAGNOSIS — M25611 Stiffness of right shoulder, not elsewhere classified: Secondary | ICD-10-CM

## 2019-08-22 DIAGNOSIS — R600 Localized edema: Secondary | ICD-10-CM | POA: Diagnosis present

## 2019-08-22 DIAGNOSIS — R0789 Other chest pain: Secondary | ICD-10-CM

## 2019-08-22 DIAGNOSIS — M19011 Primary osteoarthritis, right shoulder: Secondary | ICD-10-CM

## 2019-08-22 MED ORDER — TIZANIDINE HCL 4 MG PO TABS: 4.0000 mg | ORAL_TABLET | Freq: Three times a day (TID) | ORAL | 3 refills | Status: DC | PRN

## 2019-08-22 MED ORDER — OXYCODONE HCL 5 MG PO TABS: 5.0000 mg | ORAL_TABLET | Freq: Three times a day (TID) | ORAL | 0 refills | Status: DC | PRN

## 2019-08-22 NOTE — Therapy (Signed)
[place], [place], [address] Phone: [phone]   Fax:  [phone]  Physical Therapy Evaluation  Patient Details  Name: [name] MRN: [id number] Date of Birth: [birth year] Referring Provider (PT): Marlou Sa Tonna Corner, MD   Encounter Date: 08/22/2019  PT End of Session - 08/22/19 1203    Visit Number  1    Number of Visits  12    Date for PT Re-Evaluation  10/03/19    Authorization Type  [employer] MCR    PT Start Time  1000    PT Stop Time  1045    PT Time Calculation (min)  45 min    Activity Tolerance  Patient limited by pain    Behavior During Therapy  Anxious       Past Medical History:  Diagnosis Date  . Anxiety   . Arthritis   . Asthma   . Chronic back pain   . Chronic chest pain    resolved, no problems since 2019 per patient 07/27/19  . COPD (chronic obstructive pulmonary disease) ([place])   . Depression   . DM (diabetes mellitus) ([place])    INSULIN DEPENDENT - TYPE 1  . GERD (gastroesophageal reflux disease)   . Headache(784.0)   . Hyperlipidemia    no med, diet controlled  . Hypertension   . Hypokalemia   . Respiratory disease 05/2017  . Sleep apnea    does not use CPAP due to trach  . Tracheostomy in place [facility]) 02/2017    Past Surgical History:  Procedure Laterality Date  . ABDOMINAL HYSTERECTOMY  2009  . APPENDECTOMY    . CESAREAN SECTION     x 3  . CHOLECYSTECTOMY N/A 03/02/2014   Procedure: LAPAROSCOPIC CHOLECYSTECTOMY;  Surgeon: Joyice Faster. Cornett, MD;  Location: [place];  Service: General;  Laterality: N/A;  . HERNIA REPAIR    . PANENDOSCOPY N/A 03/04/2017   Procedure: PANENDOSCOPY WITH POSSIBLE FOREIGN BODY REMOVAL;  Surgeon: Jodi Marble, MD;  Location: [place];  Service: ENT;  Laterality: N/A;  . ROTATOR CUFF REPAIR Left   . ROTATOR CUFF REPAIR Right 07/27/2019  . SHOULDER ARTHROSCOPY WITH SUBACROMIAL DECOMPRESSION, ROTATOR CUFF REPAIR AND BICEP TENDON  REPAIR Right 07/28/2019   Procedure: RIGHT SHOULDER ARTHROSCOP, MINI OPEN ROTATOR CUFF TEAR REPAIR,  BICEPS TENODESIS, DISTAL CLAVICLE EXCISION;  Surgeon: Meredith Pel, MD;  Location: [place];  Service: Orthopedics;  Laterality: Right;  . TRACHEOSTOMY  02/2017  . VESICOVAGINAL FISTULA CLOSURE W/ TAH  2009    There were no vitals filed for this visit.   Subjective Assessment - 08/22/19 1005    Subjective  Patient arrives following rotator cuff repair. She reports increased shoulder pain today and increased muscle spasm. She is not wearing her sling this visit and states that her doctor told her to stop wearing the sling a week after her last appointment with him (08/05/2019). She states that she has been trying not to use the arm but has had to some things with it because she can't do certain things without help. She reports trouble sleeping, she may get 4-5 hours of sleep per night.    Pertinent History  Tracheostomy dependent, DM, BMI, anxiety, depression, HTN, previous left shoulder surgery    Limitations  House hold activities;Lifting;Standing;Walking    How long can you sit comfortably?  10-15 minutes    How long can you stand comfortably?  10-15 minutes    How long can you walk comfortably?  10-15 minutes    Diagnostic tests  X-ray, MRI    Patient Stated Goals  Lift arm overhead so she can return to prior level of function    Currently in Pain?  Yes    Pain Score  8     Pain Location  Shoulder    Pain Orientation  Right    Pain Descriptors / Indicators  Spasm;Aching;Throbbing;Sharp    Pain Type  Surgical pain    Pain Radiating Towards  Right arm    Pain Onset  More than a month ago    Pain Frequency  Constant    Aggravating Factors   Any movement of the right arm, touching the right shoulder and arm, sleeping    Pain Relieving Factors  Medication, ice when pain is less intense    Effect of Pain on Daily Activities  Patient is unable to use the right arm secondary to surgical  precautions and pain         [facility] PT Assessment - 08/22/19 0001      Assessment   Medical Diagnosis  Right rotator cuff repair, distal clavicle excission, bicep tenodesis    Referring Provider (PT)  Meredith Pel, MD    Onset Date/Surgical Date  07/28/19    Hand Dominance  Right    Next MD Visit  08/22/2019    Prior Therapy  Yes      Precautions   Precautions  Shoulder    Type of Shoulder Precautions  Rotator cuff repair, bicep tenodesis    Precaution Comments  No AROM until 6 weeks      Restrictions   Weight Bearing Restrictions  No      Balance Screen   Has the patient fallen in the past 6 months  No    Has the patient had a decrease in activity level because of a fear of falling?   No    Is the patient reluctant to leave their home because of a fear of falling?   No      Home Film/video editor residence    Living Arrangements  Spouse/significant other;Other relatives      Prior Function   Level of Independence  Independent with basic ADLs    Vocation  Unemployed      Cognition   Overall Cognitive Status  Within Functional Limits for tasks assessed      Observation/Other Assessments   Observations  Patient arrives without sling, she is using the right arm actively and picking up her purse/objects with right arm    Focus on Therapeutic Outcomes (FOTO)   58% limitation      Observation/Other Assessments-Edema    Edema  --   Patient exhibits visibly greater edema of right shoulder/arm     Sensation   Light Touch  Appears Intact      Posture/Postural Control   Posture Comments  Patient exhibits a rounded shoulder and forward head posture with right arm guarding by side      ROM / Strength   AROM / PROM / Strength  PROM      PROM   Overall PROM Comments  Elbow PROM WFL, Cervical AROM WFL    PROM Assessment Site  Shoulder    Right/Left Shoulder  Right    Right Shoulder Flexion  80 Degrees   limited by pain   Right Shoulder  External Rotation  30 Degrees      Palpation   Palpation comment  Patient  reports generalized right shoulder and upper arm tenderness with light palpation      Transfers   Transfers  Independent with all Transfers                Objective measurements completed on examination: See above findings.      [facility] Adult PT Treatment/Exercise - 08/22/19 0001      Self-Care   Self-Care  RICE;Heat/Ice Application;Other Self-Care Comments;ADL's;Posture    ADL's  Avoid using right shoulder.elbow actively with ADLs    Posture  Positioning while seated or sleeping    Heat/Ice Application  Ice as needed    Other Self-Care Comments   Surgical post-op precautions      Exercises   Exercises  Shoulder      Shoulder Exercises: Supine   External Rotation  PROM;10 reps    External Rotation Limitations  dowel      Shoulder Exercises: Seated   Elevation  PROM;10 reps    Elevation Limitations  forward table slide    Other Seated Exercises  Seated elbow PROM x10      Shoulder Exercises: Stretch   Other Shoulder Stretches  Seated upper trap stretch x20 sec             PT Education - 08/22/19 1201    Education Details  Surgical precautions, POC, HEP, rest and sleep positioning, modalities for pain control    Person(s) Educated  Patient    Methods  Explanation;Demonstration;Verbal cues;Handout    Comprehension  Verbalized understanding;Returned demonstration;Verbal cues required;Need further instruction       PT Short Term Goals - 08/22/19 1211      PT SHORT TERM GOAL #1   Title  She will be indpendent with initial HEP to make progress in PT and understand post-op precautions.    Time  2    Period  Weeks    Status  New    Target Date  09/05/19      PT SHORT TERM GOAL #2   Title  Patient will report pain level < or = 6/10 more than 75% of the time to allow for improved sleep and function.    Time  3    Period  Weeks    Status  New    Target Date  09/12/19      PT  SHORT TERM GOAL #3   Title  Patient will exhibit >90 deg passive shoulder elevation without increased pain level to improve motion.    Time  2    Target Date  09/05/19        PT Long Term Goals - 08/22/19 1313      PT LONG TERM GOAL #1   Title  She will initiate AROM with no increased pain level to allow for progression in exercises.    Time  6    Period  Weeks    Status  New    Target Date  10/03/19      PT LONG TERM GOAL #2   Title  Patient will exhibit shoulder elevation PROM >120 deg without increased pain level.    Time  6    Period  Weeks    Status  New    Target Date  10/03/19      PT LONG TERM GOAL #3   Title  Patient will report < or = 2-3/10 pain level with exercises to allow for improve activity tolerance and progress exercise.    Time  6    Period  Weeks    Status  New    Target Date  10/03/19      PT LONG TERM GOAL #4   Title  FOTO score will be < or = 50% to indicate improved functional level.    Time  6    Period  Weeks    Status  New    Target Date  10/03/19      PT LONG TERM GOAL #5   Title  Will establish new goals at 6 week assessment    Target Date  10/03/19             Plan - 08/22/19 1204    Clinical Impression Statement  Patient presents to PT secondary to right rotator cuff repair, biceps tenodesis, and distal clavicle excission. She has not been abiding by post-op precuations and is reporting increased pain that is most likely from her using her right arm for ADLs such as bathing and dressing. She was highly educated on surgical precautions including no active shoulder/elbow motion. She did exhibit good passive motion with pain at end range and she demonstrated HEP for passive motion appropriately with proper form. She would benefit from continued skilled PT to progress her motion as protocol allows and reduce pain so she can return to PLOF.    Personal Factors and Comorbidities  Comorbidity 3+;Social Background;Past/Current  Experience;Fitness;Time since onset of injury/illness/exacerbation    Comorbidities  DM, HTN, tracheostomy dependent, BMI, anxiety, depression    Examination-Activity Limitations  Bathing;Dressing;Bed Mobility;Hygiene/Grooming;Lift;Carry;Reach Overhead;Sleep    Examination-Participation Restrictions  Cleaning;Laundry;Yard Work;Community Activity;Shop;Driving;Meal Prep;Volunteer    Stability/Clinical Decision Making  Evolving/Moderate complexity    Clinical Decision Making  Moderate    Rehab Potential  Good    PT Frequency  2x / week    PT Duration  6 weeks    PT Treatment/Interventions  ADLs/Self Care Home Management;Cryotherapy;Electrical Stimulation;Moist Heat;Ultrasound;Therapeutic activities;Therapeutic exercise;Neuromuscular re-education;Patient/family education;Manual techniques;Scar mobilization;Taping;Dry needling;Passive range of motion;Vasopneumatic Device;Joint Manipulations;Spinal Manipulations    PT Next Visit Plan  Assess HEP, progress PROM as tolerated, ensure proper education on post-op precautions    PT Home Exercise Plan  Supine ER PROM with dowel, Seated forward table slide PROM, Seated elbow PROM, Seated upper trap stretch    Consulted and Agree with Plan of Care  Patient       Patient will benefit from skilled therapeutic intervention in order to improve the following deficits and impairments:  Increased muscle spasms, Decreased knowledge of precautions, Decreased range of motion, Increased edema, Decreased activity tolerance, Decreased strength, Impaired UE functional use, Postural dysfunction, Pain  Visit Diagnosis: Chronic right shoulder pain  Stiffness of right shoulder, not elsewhere classified  Muscle weakness (generalized)  Localized edema     Problem List Patient Active Problem List   Diagnosis Date Noted  . Rotator cuff tear 07/28/2019  . Diabetes ([place]) 04/12/2019  . Acute asthma exacerbation 03/22/2019  . Chest pain of uncertain etiology 123456   . Lactic acidosis 03/22/2019  . Chronic right shoulder pain   . Community acquired pneumonia 10/25/2018  . Asthma, chronic, unspecified asthma severity, with acute exacerbation 10/25/2018  . Acute respiratory failure with hypoxia and hypercapnia ([place]) 10/23/2018  . Acute pain of right shoulder due to trauma 10/05/2018  . Chronic bilateral low back pain without sciatica 10/05/2018  . Dysphonia 10/05/2018  . Severe asthma with exacerbation 10/05/2018  . Rotator cuff arthropathy, right 06/15/2018  . AKI (acute kidney injury) ([place]) 03/06/2018  . Carpal tunnel syndrome 01/18/2018  . Normocytic anemia  05/06/2017  . Tracheostomy status ([place]) 04/28/2017  . Tracheostomy dependent ([place]) 03/26/2017  . Subglottic stenosis 03/06/2017  . Tracheal stenosis 03/04/2017  . Acute blood loss anemia   . Generalized anxiety disorder   . Hypoalbuminemia due to protein-calorie malnutrition ([place])   . Morbid obesity ([place])   . Chronic pain syndrome   . Gastritis and gastroduodenitis 02/22/2016  . Depression 02/22/2016  . Migraine 01/30/2015  . Bulging lumbar disc 09/05/2013  . Essential hypertension 09/05/2013  . Allergic rhinitis, seasonal 08/11/2012  . Chronic cough 08/11/2012  . Sleep apnea, obstructive 12/04/2011    Hilda Blades, PT, DPT, LAT, ATC 08/22/19  1:29 PM Phone: [phone] Fax: [facility] [address] [place], [place], [address] Phone: [phone]   Fax:  [phone]  Name: [name] MRN: [id number] Date of Birth: 04-Oct-1970

## 2019-08-22 NOTE — Patient Instructions (Signed)
Access Code: [email or web address]  URL: [email or web address]  Date: 08/22/2019  Prepared by: Hilda Blades   Exercises Seated Neck Sidebending Stretch - 20 seconds hold - 1-2x daily Supine Shoulder External Rotation AAROM with Dowel - 10 reps - 10 seconds hold - 1x daily Seated Shoulder Flexion Towel Slide at Table Top - 10 reps - 10 seconds hold - 1x daily Supported Elbow Flexion Extension PROM - 10 reps - 1x daily

## 2019-08-22 NOTE — Progress Notes (Signed)
Post-Op Visit Note   Patient: [name]           Date of Birth: [birth year]           MRN: [id number] Visit Date: 08/22/2019 PCP: [name], MD   Assessment & Plan:  Chief Complaint:  Chief Complaint  Patient presents with  . Follow-up   Visit Diagnoses: No diagnosis found.  Plan: Patient is a 48 year old female who presents s/p distal clavicle excision with rotator cuff repair and biceps tenodesis.  She is about 3 weeks postop.  Patient notes that she has improved from the last office visit but she still has good days and bad days.  She has been using the CPM machine 2-3 times a day.  It is set on 90 degrees.  Her sling has broke couple days ago.  She admits to lifting a little bit with the right shoulder, lifting at most a 12 ounce soda bottle.  She has started physical therapy, her first day was today.  In physical therapy she should focus on passive range of motion of the right shoulder; she is okay for abduction and forward flexion of the right shoulder passively above 90 degrees.  No right shoulder rotator cuff strengthening for 3 more weeks.  No cross body adduction.  On exam she has 80 degrees of forward flexion and 80 degrees of abduction with about 40 degrees of external rotation.  She has good strength on exam of the rotator cuff muscles.  She may discontinue sling use.  Reemphasized for patient not to lift anything for the next 3 weeks.  Patient understands and will follow up in 4 weeks.  Follow-Up Instructions: No follow-ups on file.   Orders:  No orders of the defined types were placed in this encounter.  No orders of the defined types were placed in this encounter.   Imaging: No results found.  PMFS History: Patient Active Problem List   Diagnosis Date Noted  . Rotator cuff tear 07/28/2019  . Diabetes ([place]) 04/12/2019  . Acute asthma exacerbation 03/22/2019  . Chest pain of uncertain etiology 123456  . Lactic acidosis 03/22/2019  .  Chronic right shoulder pain   . Community acquired pneumonia 10/25/2018  . Asthma, chronic, unspecified asthma severity, with acute exacerbation 10/25/2018  . Acute respiratory failure with hypoxia and hypercapnia ([place]) 10/23/2018  . Acute pain of right shoulder due to trauma 10/05/2018  . Chronic bilateral low back pain without sciatica 10/05/2018  . Dysphonia 10/05/2018  . Severe asthma with exacerbation 10/05/2018  . Rotator cuff arthropathy, right 06/15/2018  . AKI (acute kidney injury) ([place]) 03/06/2018  . Carpal tunnel syndrome 01/18/2018  . Normocytic anemia 05/06/2017  . Tracheostomy status ([place]) 04/28/2017  . Tracheostomy dependent ([place]) 03/26/2017  . Subglottic stenosis 03/06/2017  . Tracheal stenosis 03/04/2017  . Acute blood loss anemia   . Generalized anxiety disorder   . Hypoalbuminemia due to protein-calorie malnutrition ([place])   . Morbid obesity ([place])   . Chronic pain syndrome   . Gastritis and gastroduodenitis 02/22/2016  . Depression 02/22/2016  . Migraine 01/30/2015  . Bulging lumbar disc 09/05/2013  . Essential hypertension 09/05/2013  . Allergic rhinitis, seasonal 08/11/2012  . Chronic cough 08/11/2012  . Sleep apnea, obstructive 12/04/2011   Past Medical History:  Diagnosis Date  . Anxiety   . Arthritis   . Asthma   . Chronic back pain   . Chronic chest pain    resolved, no problems since 2019 per patient  07/27/19  . COPD (chronic obstructive pulmonary disease) ([place])   . Depression   . DM (diabetes mellitus) ([place])    INSULIN DEPENDENT - TYPE 1  . GERD (gastroesophageal reflux disease)   . Headache(784.0)   . Hyperlipidemia    no med, diet controlled  . Hypertension   . Hypokalemia   . Respiratory disease 05/2017  . Sleep apnea    does not use CPAP due to trach  . Tracheostomy in place [facility]) 02/2017    Family History  Problem Relation Age of Onset  . Heart attack Mother   . Stroke Mother   . Diabetes Mother   . Hypertension Mother   .  Arthritis Mother   . Stroke Father   . Hypertension Sister   . Diabetes Sister   . Seizures Brother   . Diabetes Brother     Past Surgical History:  Procedure Laterality Date  . ABDOMINAL HYSTERECTOMY  2009  . APPENDECTOMY    . CESAREAN SECTION     x 3  . CHOLECYSTECTOMY N/A 03/02/2014   Procedure: LAPAROSCOPIC CHOLECYSTECTOMY;  Surgeon: Joyice Faster. Cornett, MD;  Location: [place];  Service: General;  Laterality: N/A;  . HERNIA REPAIR    . PANENDOSCOPY N/A 03/04/2017   Procedure: PANENDOSCOPY WITH POSSIBLE FOREIGN BODY REMOVAL;  Surgeon: Jodi Marble, MD;  Location: [place];  Service: ENT;  Laterality: N/A;  . ROTATOR CUFF REPAIR Left   . ROTATOR CUFF REPAIR Right 07/27/2019  . SHOULDER ARTHROSCOPY WITH SUBACROMIAL DECOMPRESSION, ROTATOR CUFF REPAIR AND BICEP TENDON REPAIR Right 07/28/2019   Procedure: RIGHT SHOULDER ARTHROSCOP, MINI OPEN ROTATOR CUFF TEAR REPAIR,  BICEPS TENODESIS, DISTAL CLAVICLE EXCISION;  Surgeon: Meredith Pel, MD;  Location: [place];  Service: Orthopedics;  Laterality: Right;  . TRACHEOSTOMY  02/2017  . VESICOVAGINAL FISTULA CLOSURE W/ TAH  2009   Social History   Occupational History  . Not on file  Tobacco Use  . Smoking status: Former Smoker    Packs/day: 0.25    Years: 22.00    Pack years: 5.50    Types: Cigarettes    Quit date: 01/20/2017    Years since quitting: 2.5  . Smokeless tobacco: Never Used  Substance and Sexual Activity  . Alcohol use: Yes    Alcohol/week: 0.0 standard drinks    Comment: socially 1-2 glasses wine  . Drug use: Yes    Types: Marijuana    Comment: Last Use Jul 13, 2019  . Sexual activity: Yes    Partners: Male    Birth control/protection: None

## 2019-08-24 DIAGNOSIS — J9500 Unspecified tracheostomy complication: Secondary | ICD-10-CM | POA: Diagnosis not present

## 2019-08-25 DIAGNOSIS — J9500 Unspecified tracheostomy complication: Secondary | ICD-10-CM | POA: Diagnosis not present

## 2019-08-29 DIAGNOSIS — J9500 Unspecified tracheostomy complication: Secondary | ICD-10-CM | POA: Diagnosis not present

## 2019-08-30 ENCOUNTER — Ambulatory Visit: Payer: Medicare HMO | Admitting: Physical Therapy

## 2019-08-31 DIAGNOSIS — J9500 Unspecified tracheostomy complication: Secondary | ICD-10-CM | POA: Diagnosis not present

## 2019-09-01 ENCOUNTER — Ambulatory Visit: Payer: Medicare HMO | Attending: Orthopedic Surgery | Admitting: Physical Therapy

## 2019-09-01 ENCOUNTER — Encounter: Payer: Self-pay | Admitting: Physical Therapy

## 2019-09-01 ENCOUNTER — Other Ambulatory Visit: Payer: Self-pay

## 2019-09-01 DIAGNOSIS — M6281 Muscle weakness (generalized): Secondary | ICD-10-CM | POA: Diagnosis not present

## 2019-09-01 DIAGNOSIS — R600 Localized edema: Secondary | ICD-10-CM

## 2019-09-01 DIAGNOSIS — G8929 Other chronic pain: Secondary | ICD-10-CM

## 2019-09-01 DIAGNOSIS — J9500 Unspecified tracheostomy complication: Secondary | ICD-10-CM | POA: Diagnosis not present

## 2019-09-01 DIAGNOSIS — M25511 Pain in right shoulder: Secondary | ICD-10-CM | POA: Diagnosis not present

## 2019-09-01 DIAGNOSIS — M25611 Stiffness of right shoulder, not elsewhere classified: Secondary | ICD-10-CM | POA: Insufficient documentation

## 2019-09-01 NOTE — Therapy (Signed)
[place], [place], [address] Phone: [phone]   Fax:  [phone]  Physical Therapy Treatment  Patient Details  Name: [name] MRN: [id number] Date of Birth: 04-23-1971 Referring Provider (PT): Marlou Sa Tonna Corner, MD   Encounter Date: 09/01/2019  PT End of Session - 09/01/19 1445    Visit Number  2    Number of Visits  12    Date for PT Re-Evaluation  10/03/19    Authorization Type  [employer] MCR    PT Start Time  [id number]    PT Stop Time  1525    PT Time Calculation (min)  40 min    Activity Tolerance  Patient tolerated treatment well    Behavior During Therapy  [facility] for tasks assessed/performed       Past Medical History:  Diagnosis Date  . Anxiety   . Arthritis   . Asthma   . Chronic back pain   . Chronic chest pain    resolved, no problems since 2019 per patient 07/27/19  . COPD (chronic obstructive pulmonary disease) ([place])   . Depression   . DM (diabetes mellitus) ([place])    INSULIN DEPENDENT - TYPE 1  . GERD (gastroesophageal reflux disease)   . Headache(784.0)   . Hyperlipidemia    no med, diet controlled  . Hypertension   . Hypokalemia   . Respiratory disease 05/2017  . Sleep apnea    does not use CPAP due to trach  . Tracheostomy in place [facility]) 02/2017    Past Surgical History:  Procedure Laterality Date  . ABDOMINAL HYSTERECTOMY  2009  . APPENDECTOMY    . CESAREAN SECTION     x 3  . CHOLECYSTECTOMY N/A 03/02/2014   Procedure: LAPAROSCOPIC CHOLECYSTECTOMY;  Surgeon: Joyice Faster. Cornett, MD;  Location: [place];  Service: General;  Laterality: N/A;  . HERNIA REPAIR    . PANENDOSCOPY N/A 03/04/2017   Procedure: PANENDOSCOPY WITH POSSIBLE FOREIGN BODY REMOVAL;  Surgeon: Jodi Marble, MD;  Location: [facility];  Service: ENT;  Laterality: N/A;  . ROTATOR CUFF REPAIR Left   . ROTATOR CUFF REPAIR Right 07/27/2019  . SHOULDER ARTHROSCOPY WITH SUBACROMIAL DECOMPRESSION,  ROTATOR CUFF REPAIR AND BICEP TENDON REPAIR Right 07/28/2019   Procedure: RIGHT SHOULDER ARTHROSCOP, MINI OPEN ROTATOR CUFF TEAR REPAIR,  BICEPS TENODESIS, DISTAL CLAVICLE EXCISION;  Surgeon: Meredith Pel, MD;  Location: [facility];  Service: Orthopedics;  Laterality: Right;  . TRACHEOSTOMY  02/2017  . VESICOVAGINAL FISTULA CLOSURE W/ TAH  2009    There were no vitals filed for this visit.  Subjective Assessment - 09/01/19 1448    Subjective  Patient reports her shoulder is doing better. She is doing exercises at home. She states she had to get dressed by herself today she her shoulder is a little painful.    Currently in Pain?  Yes    Pain Score  7     Pain Location  Shoulder    Pain Orientation  Right    Pain Descriptors / Indicators  Aching;Sharp;Throbbing;Tightness    Pain Type  Surgical pain    Pain Onset  More than a month ago    Pain Frequency  Constant         [facility] PT Assessment - 09/01/19 0001      ROM / Strength   AROM / PROM / Strength  PROM      PROM   Overall PROM Comments  All PROM limited by  pain with empty end feels    PROM Assessment Site  Shoulder    Right/Left Shoulder  Right    Right Shoulder Flexion  95 Degrees    Right Shoulder External Rotation  40 Degrees                   [facility] Adult PT Treatment/Exercise - 09/01/19 0001      Exercises   Exercises  Shoulder      Shoulder Exercises: Supine   External Rotation  PROM;10 reps   10 sec hold   External Rotation Limitations  dowel      Shoulder Exercises: Seated   Elevation  PROM;10 reps    Elevation Limitations  forward table slide    Retraction  10 reps   5 sec hold   Other Seated Exercises  Seated elbow PROM x10      Manual Therapy   Manual Therapy  Passive ROM    Passive ROM  Shoulder elevation and ER in scapular plane             PT Education - 09/01/19 1445    Education Details  HEP, surgical precautions    Person(s) Educated  Patient    Methods   Explanation;Demonstration;Verbal cues;Handout    Comprehension  Verbalized understanding;Returned demonstration;Verbal cues required;Need further instruction       PT Short Term Goals - 08/22/19 1211      PT SHORT TERM GOAL #1   Title  She will be indpendent with initial HEP to make progress in PT and understand post-op precautions.    Time  2    Period  Weeks    Status  New    Target Date  09/05/19      PT SHORT TERM GOAL #2   Title  Patient will report pain level < or = 6/10 more than 75% of the time to allow for improved sleep and function.    Time  3    Period  Weeks    Status  New    Target Date  09/12/19      PT SHORT TERM GOAL #3   Title  Patient will exhibit >90 deg passive shoulder elevation without increased pain level to improve motion.    Time  2    Target Date  09/05/19        PT Long Term Goals - 08/22/19 1313      PT LONG TERM GOAL #1   Title  She will initiate AROM with no increased pain level to allow for progression in exercises.    Time  6    Period  Weeks    Status  New    Target Date  10/03/19      PT LONG TERM GOAL #2   Title  Patient will exhibit shoulder elevation PROM >120 deg without increased pain level.    Time  6    Period  Weeks    Status  New    Target Date  10/03/19      PT LONG TERM GOAL #3   Title  Patient will report < or = 2-3/10 pain level with exercises to allow for improve activity tolerance and progress exercise.    Time  6    Period  Weeks    Status  New    Target Date  10/03/19      PT LONG TERM GOAL #4   Title  FOTO score will be < or = 50% to indicate  improved functional level.    Time  6    Period  Weeks    Status  New    Target Date  10/03/19      PT LONG TERM GOAL #5   Title  Will establish new goals at 6 week assessment    Target Date  10/03/19            Plan - 09/01/19 1509    Clinical Impression Statement  Patient is progressing well with her PROM but does exhibit a significant amount of pain at  end range. All end feels were empty and limited by pain. She required continued education on surgical precautions to avoid active motion and on HEP form to ensure proper shoulder positioning. She would benefit from continued skilled PT to progress her range as protocol allows.    PT Treatment/Interventions  ADLs/Self Care Home Management;Cryotherapy;Electrical Stimulation;Moist Heat;Ultrasound;Therapeutic activities;Therapeutic exercise;Neuromuscular re-education;Patient/family education;Manual techniques;Scar mobilization;Taping;Dry needling;Passive range of motion;Vasopneumatic Device;Joint Manipulations;Spinal Manipulations    PT Next Visit Plan  Assess HEP, progress PROM as tolerated, ensure proper education on post-op precautions    PT Home Exercise Plan  Supine ER PROM with dowel, Seated forward table slide PROM, Seated elbow PROM, Seated upper trap stretch, Shoulder blade squeezes    Consulted and Agree with Plan of Care  Patient       Patient will benefit from skilled therapeutic intervention in order to improve the following deficits and impairments:  Increased muscle spasms, Decreased knowledge of precautions, Decreased range of motion, Increased edema, Decreased activity tolerance, Decreased strength, Impaired UE functional use, Postural dysfunction, Pain  Visit Diagnosis: Chronic right shoulder pain  Stiffness of right shoulder, not elsewhere classified  Muscle weakness (generalized)  Localized edema     Problem List Patient Active Problem List   Diagnosis Date Noted  . Rotator cuff tear 07/28/2019  . Diabetes ([place]) 04/12/2019  . Acute asthma exacerbation 03/22/2019  . Chest pain of uncertain etiology 123456  . Lactic acidosis 03/22/2019  . Chronic right shoulder pain   . Community acquired pneumonia 10/25/2018  . Asthma, chronic, unspecified asthma severity, with acute exacerbation 10/25/2018  . Acute respiratory failure with hypoxia and hypercapnia ([place]) 10/23/2018   . Acute pain of right shoulder due to trauma 10/05/2018  . Chronic bilateral low back pain without sciatica 10/05/2018  . Dysphonia 10/05/2018  . Severe asthma with exacerbation 10/05/2018  . Rotator cuff arthropathy, right 06/15/2018  . AKI (acute kidney injury) ([place]) 03/06/2018  . Carpal tunnel syndrome 01/18/2018  . Normocytic anemia 05/06/2017  . Tracheostomy status ([place]) 04/28/2017  . Tracheostomy dependent ([place]) 03/26/2017  . Subglottic stenosis 03/06/2017  . Tracheal stenosis 03/04/2017  . Acute blood loss anemia   . Generalized anxiety disorder   . Hypoalbuminemia due to protein-calorie malnutrition ([place])   . Morbid obesity ([place])   . Chronic pain syndrome   . Gastritis and gastroduodenitis 02/22/2016  . Depression 02/22/2016  . Migraine 01/30/2015  . Bulging lumbar disc 09/05/2013  . Essential hypertension 09/05/2013  . Allergic rhinitis, seasonal 08/11/2012  . Chronic cough 08/11/2012  . Sleep apnea, obstructive 12/04/2011    Hilda Blades, PT, DPT, LAT, ATC 09/01/19  3:23 PM Phone: [phone] Fax: [facility] [address] [place], [place], [address] Phone: [phone]   Fax:  [phone]  Name: [name] MRN: [id number] Date of Birth: 03-23-1971

## 2019-09-05 DIAGNOSIS — J9500 Unspecified tracheostomy complication: Secondary | ICD-10-CM | POA: Diagnosis not present

## 2019-09-06 ENCOUNTER — Encounter: Payer: Self-pay | Admitting: Physical Therapy

## 2019-09-06 ENCOUNTER — Other Ambulatory Visit: Payer: Self-pay | Admitting: Surgical

## 2019-09-06 ENCOUNTER — Telehealth: Payer: Self-pay | Admitting: Orthopedic Surgery

## 2019-09-06 ENCOUNTER — Ambulatory Visit: Payer: Medicare HMO | Admitting: Physical Therapy

## 2019-09-06 ENCOUNTER — Other Ambulatory Visit: Payer: Self-pay

## 2019-09-06 DIAGNOSIS — M25511 Pain in right shoulder: Secondary | ICD-10-CM | POA: Diagnosis not present

## 2019-09-06 DIAGNOSIS — M6281 Muscle weakness (generalized): Secondary | ICD-10-CM

## 2019-09-06 DIAGNOSIS — R600 Localized edema: Secondary | ICD-10-CM | POA: Diagnosis not present

## 2019-09-06 DIAGNOSIS — M25611 Stiffness of right shoulder, not elsewhere classified: Secondary | ICD-10-CM

## 2019-09-06 DIAGNOSIS — G8929 Other chronic pain: Secondary | ICD-10-CM | POA: Diagnosis not present

## 2019-09-06 MED ORDER — OXYCODONE HCL 5 MG PO TABS: 5.0000 mg | ORAL_TABLET | Freq: Two times a day (BID) | ORAL | 0 refills | Status: DC | PRN

## 2019-09-06 NOTE — Telephone Encounter (Signed)
Please advise. Thanks.  

## 2019-09-06 NOTE — Therapy (Signed)
[place], [place], [address] Phone: [phone]   Fax:  [phone]  Physical Therapy Treatment  Patient Details  Name: [name] MRN: [id number] Date of Birth: Feb 03, 1971 Referring Provider (PT): Marlou Sa Tonna Corner, MD   Encounter Date: 09/06/2019  PT End of Session - 09/06/19 1645    Visit Number  3    Number of Visits  12    Date for PT Re-Evaluation  10/03/19    Authorization Type  [employer] MCR    PT Start Time  0254    PT Stop Time  1655    PT Time Calculation (min)  42 min    Activity Tolerance  Patient limited by pain    Behavior During Therapy  --   Patient appears in apparent distress especially with exercise      Past Medical History:  Diagnosis Date  . Anxiety   . Arthritis   . Asthma   . Chronic back pain   . Chronic chest pain    resolved, no problems since 2019 per patient 07/27/19  . COPD (chronic obstructive pulmonary disease) ([place])   . Depression   . DM (diabetes mellitus) ([place])    INSULIN DEPENDENT - TYPE 1  . GERD (gastroesophageal reflux disease)   . Headache(784.0)   . Hyperlipidemia    no med, diet controlled  . Hypertension   . Hypokalemia   . Respiratory disease 05/2017  . Sleep apnea    does not use CPAP due to trach  . Tracheostomy in place [facility]) 02/2017    Past Surgical History:  Procedure Laterality Date  . ABDOMINAL HYSTERECTOMY  2009  . APPENDECTOMY    . CESAREAN SECTION     x 3  . CHOLECYSTECTOMY N/A 03/02/2014   Procedure: LAPAROSCOPIC CHOLECYSTECTOMY;  Surgeon: Joyice Faster. Cornett, MD;  Location: [facility];  Service: General;  Laterality: N/A;  . HERNIA REPAIR    . PANENDOSCOPY N/A 03/04/2017   Procedure: PANENDOSCOPY WITH POSSIBLE FOREIGN BODY REMOVAL;  Surgeon: Jodi Marble, MD;  Location: [facility];  Service: ENT;  Laterality: N/A;  . ROTATOR CUFF REPAIR Left   . ROTATOR CUFF REPAIR Right 07/27/2019  . SHOULDER ARTHROSCOPY WITH  SUBACROMIAL DECOMPRESSION, ROTATOR CUFF REPAIR AND BICEP TENDON REPAIR Right 07/28/2019   Procedure: RIGHT SHOULDER ARTHROSCOP, MINI OPEN ROTATOR CUFF TEAR REPAIR,  BICEPS TENODESIS, DISTAL CLAVICLE EXCISION;  Surgeon: Meredith Pel, MD;  Location: [facility];  Service: Orthopedics;  Laterality: Right;  . TRACHEOSTOMY  02/2017  . VESICOVAGINAL FISTULA CLOSURE W/ TAH  2009    There were no vitals filed for this visit.  Subjective Assessment - 09/06/19 1641    Subjective  Patient reports increased right shoulder pain today because she has to undress herself last night which caused her to use her right arm and this elevated her pain level. She is currently at a 7/10.    Currently in Pain?  Yes    Pain Score  7     Pain Location  Shoulder    Pain Orientation  Right    Pain Descriptors / Indicators  Throbbing;Burning;Tightness;Sharp    Pain Type  Surgical pain    Pain Onset  More than a month ago    Pain Frequency  Constant         [facility] PT Assessment - 09/06/19 0001      ROM / Strength   AROM / PROM / Strength  PROM  PROM   Overall PROM Comments  All PROM limited by pain with empty end feels    Right Shoulder Flexion  95 Degrees    Right Shoulder External Rotation  40 Degrees                   [facility] Adult PT Treatment/Exercise - 09/06/19 0001      Exercises   Exercises  Shoulder      Modalities   Modalities  Electrical Stimulation;Vasopneumatic      Electrical Stimulation   Electrical Stimulation Location  Right Shoulder    Electrical Stimulation Action  IFC 80-150 x15 min    Electrical Stimulation Parameters  Patient tolerance    Electrical Stimulation Goals  Pain      Vasopneumatic   Number Minutes Vasopneumatic   15 minutes    Vasopnuematic Location   Shoulder    Vasopneumatic Pressure  Low    Vasopneumatic Temperature   34      Manual Therapy   Manual Therapy  Passive ROM    Passive ROM  Supine shoulder elevation and ER in scapular plane, elbow  flexion/extension             PT Education - 09/06/19 1644    Education Details  Continued HEP for PROM, avoiding active motion and lifting with the right arm, using the sling if needed for pain control    Person(s) Educated  Patient    Methods  Explanation    Comprehension  Verbalized understanding       PT Short Term Goals - 09/06/19 1651      PT SHORT TERM GOAL #1   Title  She will be indpendent with initial HEP to make progress in PT and understand post-op precautions.    Time  2    Period  Weeks    Status  Partially Met    Target Date  09/05/19      PT SHORT TERM GOAL #2   Title  Patient will report pain level < or = 6/10 more than 75% of the time to allow for improved sleep and function.    Time  3    Period  Weeks    Status  Partially Met    Target Date  09/12/19      PT SHORT TERM GOAL #3   Title  Patient will exhibit >90 deg passive shoulder elevation without increased pain level to improve motion.    Time  2    Status  Partially Met    Target Date  09/05/19        PT Long Term Goals - 08/22/19 1313      PT LONG TERM GOAL #1   Title  She will initiate AROM with no increased pain level to allow for progression in exercises.    Time  6    Period  Weeks    Status  New    Target Date  10/03/19      PT LONG TERM GOAL #2   Title  Patient will exhibit shoulder elevation PROM >120 deg without increased pain level.    Time  6    Period  Weeks    Status  New    Target Date  10/03/19      PT LONG TERM GOAL #3   Title  Patient will report < or = 2-3/10 pain level with exercises to allow for improve activity tolerance and progress exercise.    Time  6  Period  Weeks    Status  New    Target Date  10/03/19      PT LONG TERM GOAL #4   Title  FOTO score will be < or = 50% to indicate improved functional level.    Time  6    Period  Weeks    Status  New    Target Date  10/03/19      PT LONG TERM GOAL #5   Title  Will establish new goals at 6 week  assessment    Target Date  10/03/19            Plan - 09/06/19 1647    Clinical Impression Statement  Patient was only able to tolerate manual passive range of motion this visit which she reported increasing her pain level to 8-9/10 toward end ranges of motion. She continues to exhibit limitation in PROM empty end feels for elevation and external rotation secondary to pain. She reports reduction in pain level following modalities to 6/10. She was instructed on continued HEP for PROM, pain management by sling use and modalities as needed, and surgical precautions to avoid actively using the right arm. She would benefit from continued skilled PT to progress her motion as tolerated.    PT Treatment/Interventions  ADLs/Self Care Home Management;Cryotherapy;Electrical Stimulation;Moist Heat;Ultrasound;Therapeutic activities;Therapeutic exercise;Neuromuscular re-education;Patient/family education;Manual techniques;Scar mobilization;Taping;Dry needling;Passive range of motion;Vasopneumatic Device;Joint Manipulations;Spinal Manipulations    PT Next Visit Plan  Assess HEP, progress PROM as tolerated, ensure proper education on post-op precautions    PT Home Exercise Plan  Supine ER PROM with dowel, Seated forward table slide PROM, Seated elbow PROM, Seated upper trap stretch, Shoulder blade squeezes    Consulted and Agree with Plan of Care  Patient       Patient will benefit from skilled therapeutic intervention in order to improve the following deficits and impairments:  Increased muscle spasms, Decreased knowledge of precautions, Decreased range of motion, Increased edema, Decreased activity tolerance, Decreased strength, Impaired UE functional use, Postural dysfunction, Pain  Visit Diagnosis: Chronic right shoulder pain  Stiffness of right shoulder, not elsewhere classified  Muscle weakness (generalized)  Localized edema     Problem List Patient Active Problem List   Diagnosis Date Noted   . Rotator cuff tear 07/28/2019  . Diabetes ([place]) 04/12/2019  . Acute asthma exacerbation 03/22/2019  . Chest pain of uncertain etiology 60/63/0160  . Lactic acidosis 03/22/2019  . Chronic right shoulder pain   . Community acquired pneumonia 10/25/2018  . Asthma, chronic, unspecified asthma severity, with acute exacerbation 10/25/2018  . Acute respiratory failure with hypoxia and hypercapnia ([place]) 10/23/2018  . Acute pain of right shoulder due to trauma 10/05/2018  . Chronic bilateral low back pain without sciatica 10/05/2018  . Dysphonia 10/05/2018  . Severe asthma with exacerbation 10/05/2018  . Rotator cuff arthropathy, right 06/15/2018  . AKI (acute kidney injury) ([place]) 03/06/2018  . Carpal tunnel syndrome 01/18/2018  . Normocytic anemia 05/06/2017  . Tracheostomy status ([place]) 04/28/2017  . Tracheostomy dependent ([place]) 03/26/2017  . Subglottic stenosis 03/06/2017  . Tracheal stenosis 03/04/2017  . Acute blood loss anemia   . Generalized anxiety disorder   . Hypoalbuminemia due to protein-calorie malnutrition ([place])   . Morbid obesity ([place])   . Chronic pain syndrome   . Gastritis and gastroduodenitis 02/22/2016  . Depression 02/22/2016  . Migraine 01/30/2015  . Bulging lumbar disc 09/05/2013  . Essential hypertension 09/05/2013  . Allergic rhinitis, seasonal 08/11/2012  . Chronic cough  08/11/2012  . Sleep apnea, obstructive 12/04/2011    Hilda Blades, PT, DPT, LAT, ATC 09/06/19  5:03 PM Phone: [phone] Fax: [facility] [address] [place], [place], [address] Phone: [phone]   Fax:  [phone]  Name: [name] MRN: [id number] Date of Birth: [birth year]

## 2019-09-06 NOTE — Telephone Encounter (Signed)
Patient called needing Rx refilled (Oxycodone) The number to contact patient is [phone]

## 2019-09-07 DIAGNOSIS — J9500 Unspecified tracheostomy complication: Secondary | ICD-10-CM | POA: Diagnosis not present

## 2019-09-08 ENCOUNTER — Ambulatory Visit: Payer: Medicare HMO | Admitting: Physical Therapy

## 2019-09-08 DIAGNOSIS — J9500 Unspecified tracheostomy complication: Secondary | ICD-10-CM | POA: Diagnosis not present

## 2019-09-12 ENCOUNTER — Encounter: Payer: Self-pay | Admitting: Physical Therapy

## 2019-09-12 ENCOUNTER — Other Ambulatory Visit: Payer: Self-pay

## 2019-09-12 ENCOUNTER — Ambulatory Visit: Payer: Medicare HMO | Admitting: Physical Therapy

## 2019-09-12 DIAGNOSIS — J9500 Unspecified tracheostomy complication: Secondary | ICD-10-CM | POA: Diagnosis not present

## 2019-09-12 DIAGNOSIS — M25611 Stiffness of right shoulder, not elsewhere classified: Secondary | ICD-10-CM

## 2019-09-12 DIAGNOSIS — M25511 Pain in right shoulder: Secondary | ICD-10-CM

## 2019-09-12 DIAGNOSIS — M6281 Muscle weakness (generalized): Secondary | ICD-10-CM

## 2019-09-12 DIAGNOSIS — G8929 Other chronic pain: Secondary | ICD-10-CM

## 2019-09-12 DIAGNOSIS — R600 Localized edema: Secondary | ICD-10-CM

## 2019-09-12 NOTE — Therapy (Signed)
[place], [place], [address] Phone: [phone]   Fax:  [phone]  Physical Therapy Treatment  Patient Details  Name: [name] MRN: [id number] Date of Birth: 1971/03/11 Referring Provider (PT): Marlou Sa Tonna Corner, MD   Encounter Date: 09/12/2019  PT End of Session - 09/12/19 1445    Visit Number  4    Number of Visits  12    Date for PT Re-Evaluation  10/03/19    Authorization Type  [employer] MCR    PT Start Time  [id number]    PT Stop Time  1530    PT Time Calculation (min)  45 min    Activity Tolerance  Patient limited by pain    Behavior During Therapy  [facility] for tasks assessed/performed       Past Medical History:  Diagnosis Date  . Anxiety   . Arthritis   . Asthma   . Chronic back pain   . Chronic chest pain    resolved, no problems since 2019 per patient 07/27/19  . COPD (chronic obstructive pulmonary disease) ([place])   . Depression   . DM (diabetes mellitus) ([place])    INSULIN DEPENDENT - TYPE 1  . GERD (gastroesophageal reflux disease)   . Headache(784.0)   . Hyperlipidemia    no med, diet controlled  . Hypertension   . Hypokalemia   . Respiratory disease 05/2017  . Sleep apnea    does not use CPAP due to trach  . Tracheostomy in place [facility]) 02/2017    Past Surgical History:  Procedure Laterality Date  . ABDOMINAL HYSTERECTOMY  2009  . APPENDECTOMY    . CESAREAN SECTION     x 3  . CHOLECYSTECTOMY N/A 03/02/2014   Procedure: LAPAROSCOPIC CHOLECYSTECTOMY;  Surgeon: Joyice Faster. Cornett, MD;  Location: [place];  Service: General;  Laterality: N/A;  . HERNIA REPAIR    . PANENDOSCOPY N/A 03/04/2017   Procedure: PANENDOSCOPY WITH POSSIBLE FOREIGN BODY REMOVAL;  Surgeon: Jodi Marble, MD;  Location: [place];  Service: ENT;  Laterality: N/A;  . ROTATOR CUFF REPAIR Left   . ROTATOR CUFF REPAIR Right 07/27/2019  . SHOULDER ARTHROSCOPY WITH SUBACROMIAL DECOMPRESSION, ROTATOR CUFF  REPAIR AND BICEP TENDON REPAIR Right 07/28/2019   Procedure: RIGHT SHOULDER ARTHROSCOP, MINI OPEN ROTATOR CUFF TEAR REPAIR,  BICEPS TENODESIS, DISTAL CLAVICLE EXCISION;  Surgeon: Meredith Pel, MD;  Location: [place];  Service: Orthopedics;  Laterality: Right;  . TRACHEOSTOMY  02/2017  . VESICOVAGINAL FISTULA CLOSURE W/ TAH  2009    There were no vitals filed for this visit.  Subjective Assessment - 09/12/19 1442    Subjective  Patient reports she is feeling much better this visit. She wore her sling some over the weekend and while sleeping which really helped to diminish her pain.    Currently in Pain?  Yes    Pain Score  4     Pain Location  Shoulder    Pain Orientation  Right    Pain Descriptors / Indicators  Throbbing;Burning;Sharp;Tightness    Pain Type  Surgical pain    Pain Onset  More than a month ago    Pain Frequency  Constant         [facility] PT Assessment - 09/12/19 0001      Assessment   Medical Diagnosis  Right rotator cuff repair, distal clavicle excission, bicep tenodesis    Referring Provider (PT)  Marlou Sa Tonna Corner, MD    Onset Date/Surgical  Date  07/28/19      PROM   Overall PROM Comments  All PROM limited by pain with empty end feels    Right Shoulder Flexion  110 Degrees    Right Shoulder External Rotation  50 Degrees                   [facility] Adult PT Treatment/Exercise - 09/12/19 0001      Exercises   Exercises  Shoulder      Shoulder Exercises: Standing   Flexion  AAROM;20 reps    Flexion Limitations  table slide with swiss ball    ABduction  AAROM;20 reps    ABduction Limitations  table slide with swiss ball    Retraction  20 reps      Vasopneumatic   Number Minutes Vasopneumatic   10 minutes    Vasopnuematic Location   Shoulder    Vasopneumatic Pressure  Low    Vasopneumatic Temperature   34      Manual Therapy   Manual Therapy  Passive ROM;Soft tissue mobilization    Soft tissue mobilization  Deltoid region, bicep     Passive ROM  Supine shoulder elevation and ER in scapular plane, elbow flexion/extension             PT Education - 09/12/19 1443    Education Details  Continued HEP for PROM, avoiding active motion and lifting with the right arm, using the sling if needed for pain control    Person(s) Educated  Patient    Methods  Explanation;Demonstration;Verbal cues;Tactile cues    Comprehension  Verbalized understanding;Returned demonstration;Verbal cues required;Tactile cues required       PT Short Term Goals - 09/12/19 1521      PT SHORT TERM GOAL #1   Title  She will be indpendent with initial HEP to make progress in PT and understand post-op precautions.    Time  2    Period  Weeks    Status  On-going    Target Date  09/05/19      PT SHORT TERM GOAL #2   Title  Patient will report pain level < or = 6/10 more than 75% of the time to allow for improved sleep and function.    Time  3    Period  Weeks    Status  Achieved    Target Date  09/12/19      PT SHORT TERM GOAL #3   Title  Patient will exhibit >90 deg passive shoulder elevation without increased pain level to improve motion.    Time  2    Status  Achieved    Target Date  09/05/19        PT Long Term Goals - 08/22/19 1313      PT LONG TERM GOAL #1   Title  She will initiate AROM with no increased pain level to allow for progression in exercises.    Time  6    Period  Weeks    Status  New    Target Date  10/03/19      PT LONG TERM GOAL #2   Title  Patient will exhibit shoulder elevation PROM >120 deg without increased pain level.    Time  6    Period  Weeks    Status  New    Target Date  10/03/19      PT LONG TERM GOAL #3   Title  Patient will report < or = 2-3/10 pain level  with exercises to allow for improve activity tolerance and progress exercise.    Time  6    Period  Weeks    Status  New    Target Date  10/03/19      PT LONG TERM GOAL #4   Title  FOTO score will be < or = 50% to indicate improved  functional level.    Time  6    Period  Weeks    Status  New    Target Date  10/03/19      PT LONG TERM GOAL #5   Title  Will establish new goals at 6 week assessment    Target Date  10/03/19            Plan - 09/12/19 1516    Clinical Impression Statement  Patient did much better this visit and she exhibited improved passive motion and tolerated the addition of light AAROM without a significant increase in pain. She was encouraged to continues using sling for pain control at home. She was instructed in table slide AAROM exercises to perform at home and to continue her stretching to progress range of motion. She would benefit from continued skilled PT to progress her motion to return to PLOF.    PT Treatment/Interventions  ADLs/Self Care Home Management;Cryotherapy;Electrical Stimulation;Moist Heat;Ultrasound;Therapeutic activities;Therapeutic exercise;Neuromuscular re-education;Patient/family education;Manual techniques;Scar mobilization;Taping;Dry needling;Passive range of motion;Vasopneumatic Device;Joint Manipulations;Spinal Manipulations    PT Next Visit Plan  Assess HEP, manual to improve PROM as tolerated, progress AAROM exercises as tolerated    PT Home Exercise Plan  Supine ER PROM with dowel, Seated forward table slide PROM/AAROM, Seated elbow AAROM, Seated upper trap stretch, Shoulder blade squeezes    Consulted and Agree with Plan of Care  Patient       Patient will benefit from skilled therapeutic intervention in order to improve the following deficits and impairments:  Increased muscle spasms, Decreased knowledge of precautions, Decreased range of motion, Increased edema, Decreased activity tolerance, Decreased strength, Impaired UE functional use, Postural dysfunction, Pain  Visit Diagnosis: Chronic right shoulder pain  Stiffness of right shoulder, not elsewhere classified  Muscle weakness (generalized)  Localized edema     Problem List Patient Active Problem  List   Diagnosis Date Noted  . Rotator cuff tear 07/28/2019  . Diabetes ([place]) 04/12/2019  . Acute asthma exacerbation 03/22/2019  . Chest pain of uncertain etiology 123456  . Lactic acidosis 03/22/2019  . Chronic right shoulder pain   . Community acquired pneumonia 10/25/2018  . Asthma, chronic, unspecified asthma severity, with acute exacerbation 10/25/2018  . Acute respiratory failure with hypoxia and hypercapnia ([place]) 10/23/2018  . Acute pain of right shoulder due to trauma 10/05/2018  . Chronic bilateral low back pain without sciatica 10/05/2018  . Dysphonia 10/05/2018  . Severe asthma with exacerbation 10/05/2018  . Rotator cuff arthropathy, right 06/15/2018  . AKI (acute kidney injury) ([place]) 03/06/2018  . Carpal tunnel syndrome 01/18/2018  . Normocytic anemia 05/06/2017  . Tracheostomy status ([place]) 04/28/2017  . Tracheostomy dependent ([place]) 03/26/2017  . Subglottic stenosis 03/06/2017  . Tracheal stenosis 03/04/2017  . Acute blood loss anemia   . Generalized anxiety disorder   . Hypoalbuminemia due to protein-calorie malnutrition ([place])   . Morbid obesity ([place])   . Chronic pain syndrome   . Gastritis and gastroduodenitis 02/22/2016  . Depression 02/22/2016  . Migraine 01/30/2015  . Bulging lumbar disc 09/05/2013  . Essential hypertension 09/05/2013  . Allergic rhinitis, seasonal 08/11/2012  . Chronic cough  08/11/2012  . Sleep apnea, obstructive 12/04/2011     Hilda Blades, PT, DPT, LAT, ATC 09/12/19  3:32 PM Phone: [phone] Fax: [place] [facility] [address] [place], [place], [address] Phone: [phone]   Fax:  [phone]  Name: [name] MRN: [id number] Date of Birth: 1971-03-10

## 2019-09-12 NOTE — Patient Instructions (Signed)
Access Code: [email or web address]  URL: [email or web address]  Date: 09/12/2019  Prepared by: Hilda Blades   Exercises Seated Shoulder Flexion Towel Slide at Table Top - 10 reps - 3-4x daily Seated Shoulder Abduction Towel Slide at Table Top - 10 reps - 3-4x daily

## 2019-09-14 ENCOUNTER — Other Ambulatory Visit: Payer: Self-pay

## 2019-09-14 ENCOUNTER — Ambulatory Visit: Payer: Medicare HMO | Admitting: Physical Therapy

## 2019-09-14 ENCOUNTER — Encounter: Payer: Self-pay | Admitting: Physical Therapy

## 2019-09-14 DIAGNOSIS — J9500 Unspecified tracheostomy complication: Secondary | ICD-10-CM | POA: Diagnosis not present

## 2019-09-14 DIAGNOSIS — G8929 Other chronic pain: Secondary | ICD-10-CM | POA: Diagnosis not present

## 2019-09-14 DIAGNOSIS — R600 Localized edema: Secondary | ICD-10-CM | POA: Diagnosis not present

## 2019-09-14 DIAGNOSIS — M6281 Muscle weakness (generalized): Secondary | ICD-10-CM

## 2019-09-14 DIAGNOSIS — M25611 Stiffness of right shoulder, not elsewhere classified: Secondary | ICD-10-CM | POA: Diagnosis not present

## 2019-09-14 DIAGNOSIS — E109 Type 1 diabetes mellitus without complications: Secondary | ICD-10-CM

## 2019-09-14 DIAGNOSIS — M25511 Pain in right shoulder: Secondary | ICD-10-CM | POA: Diagnosis not present

## 2019-09-14 MED ORDER — ONETOUCH VERIO VI STRP: ORAL_STRIP | 2 refills | Status: DC

## 2019-09-14 NOTE — Therapy (Signed)
[place], [place], [address] Phone: [phone]   Fax:  [phone]  Physical Therapy Treatment  Patient Details  Name: [name] MRN: [id number] Date of Birth: [birth year] Referring Provider (PT): Marlou Sa Tonna Corner, MD   Encounter Date: 09/14/2019  PT End of Session - 09/14/19 1516    Visit Number  5    Number of Visits  12    Date for PT Re-Evaluation  10/03/19    Authorization Type  Aetna MCR    PT Start Time  1448    PT Stop Time  1537    PT Time Calculation (min)  49 min    Activity Tolerance  Patient tolerated treatment well    Behavior During Therapy  [facility] for tasks assessed/performed       Past Medical History:  Diagnosis Date  . Anxiety   . Arthritis   . Asthma   . Chronic back pain   . Chronic chest pain    resolved, no problems since 2019 per patient 07/27/19  . COPD (chronic obstructive pulmonary disease) ([place])   . Depression   . DM (diabetes mellitus) ([place])    INSULIN DEPENDENT - TYPE 1  . GERD (gastroesophageal reflux disease)   . Headache(784.0)   . Hyperlipidemia    no med, diet controlled  . Hypertension   . Hypokalemia   . Respiratory disease 05/2017  . Sleep apnea    does not use CPAP due to trach  . Tracheostomy in place [facility]) 02/2017    Past Surgical History:  Procedure Laterality Date  . ABDOMINAL HYSTERECTOMY  2009  . APPENDECTOMY    . CESAREAN SECTION     x 3  . CHOLECYSTECTOMY N/A 03/02/2014   Procedure: LAPAROSCOPIC CHOLECYSTECTOMY;  Surgeon: Joyice Faster. Cornett, MD;  Location: [place];  Service: General;  Laterality: N/A;  . HERNIA REPAIR    . PANENDOSCOPY N/A 03/04/2017   Procedure: PANENDOSCOPY WITH POSSIBLE FOREIGN BODY REMOVAL;  Surgeon: Jodi Marble, MD;  Location: [place];  Service: ENT;  Laterality: N/A;  . ROTATOR CUFF REPAIR Left   . ROTATOR CUFF REPAIR Right 07/27/2019  . SHOULDER ARTHROSCOPY WITH SUBACROMIAL DECOMPRESSION,  ROTATOR CUFF REPAIR AND BICEP TENDON REPAIR Right 07/28/2019   Procedure: RIGHT SHOULDER ARTHROSCOP, MINI OPEN ROTATOR CUFF TEAR REPAIR,  BICEPS TENODESIS, DISTAL CLAVICLE EXCISION;  Surgeon: Meredith Pel, MD;  Location: [place];  Service: Orthopedics;  Laterality: Right;  . TRACHEOSTOMY  02/2017  . VESICOVAGINAL FISTULA CLOSURE W/ TAH  2009    There were no vitals filed for this visit.  Subjective Assessment - 09/14/19 1452    Subjective  Patient reports she is doing well and exercises are going well. She just got dressed and this always aggravates the shoulder a little bit.    Currently in Pain?  Yes    Pain Score  5     Pain Location  Shoulder    Pain Orientation  Right    Pain Descriptors / Indicators  Aching    Pain Type  Surgical pain    Pain Onset  More than a month ago    Pain Frequency  Constant         [facility] PT Assessment - 09/14/19 0001      Assessment   Medical Diagnosis  Right rotator cuff repair, distal clavicle excission, bicep tenodesis    Referring Provider (PT)  Meredith Pel, MD    Onset Date/Surgical Date  07/28/19  PROM   Overall PROM Comments  All PROM limited by pain with empty end feels    Right Shoulder Flexion  120 Degrees    Right Shoulder External Rotation  60 Degrees                   [facility] Adult PT Treatment/Exercise - 09/14/19 0001      Exercises   Exercises  Shoulder      Shoulder Exercises: Supine   Flexion  AAROM;10 reps    Flexion Limitations  Supine chest press with dowel      Shoulder Exercises: Seated   Retraction  10 reps   5 sec hold   External Rotation  AAROM;20 reps      Shoulder Exercises: Standing   Flexion  AAROM;20 reps    Flexion Limitations  table slide with swiss ball    ABduction  AAROM;20 reps    ABduction Limitations  table slide with swiss ball      Shoulder Exercises: Pulleys   Flexion  2 minutes    Scaption  2 minutes      Vasopneumatic   Number Minutes Vasopneumatic   10  minutes    Vasopnuematic Location   Shoulder    Vasopneumatic Pressure  Low    Vasopneumatic Temperature   34      Manual Therapy   Manual Therapy  Passive ROM    Passive ROM  Supine shoulder in all directions, elbow             PT Education - 09/14/19 1515    Education Details  HEP    Person(s) Educated  Patient    Methods  Explanation;Demonstration;Verbal cues;Handout    Comprehension  Verbalized understanding;Returned demonstration;Verbal cues required;Need further instruction       PT Short Term Goals - 09/12/19 1521      PT SHORT TERM GOAL #1   Title  She will be indpendent with initial HEP to make progress in PT and understand post-op precautions.    Time  2    Period  Weeks    Status  On-going    Target Date  09/05/19      PT SHORT TERM GOAL #2   Title  Patient will report pain level < or = 6/10 more than 75% of the time to allow for improved sleep and function.    Time  3    Period  Weeks    Status  Achieved    Target Date  09/12/19      PT SHORT TERM GOAL #3   Title  Patient will exhibit >90 deg passive shoulder elevation without increased pain level to improve motion.    Time  2    Status  Achieved    Target Date  09/05/19        PT Long Term Goals - 08/22/19 1313      PT LONG TERM GOAL #1   Title  She will initiate AROM with no increased pain level to allow for progression in exercises.    Time  6    Period  Weeks    Status  New    Target Date  10/03/19      PT LONG TERM GOAL #2   Title  Patient will exhibit shoulder elevation PROM >120 deg without increased pain level.    Time  6    Period  Weeks    Status  New    Target Date  10/03/19  PT LONG TERM GOAL #3   Title  Patient will report < or = 2-3/10 pain level with exercises to allow for improve activity tolerance and progress exercise.    Time  6    Period  Weeks    Status  New    Target Date  10/03/19      PT LONG TERM GOAL #4   Title  FOTO score will be < or = 50% to  indicate improved functional level.    Time  6    Period  Weeks    Status  New    Target Date  10/03/19      PT LONG TERM GOAL #5   Title  Will establish new goals at 6 week assessment    Target Date  10/03/19            Plan - 09/14/19 1516    Clinical Impression Statement  Patient is just about 7 weeks post-op, she is progressing well with her range of motion and tolerating greater AAROM exercises. She does continue to have pain at end range with range of motion but this continues to improve. She would benefit from continued skilled PT to progress her motion and return to PLOF.    PT Treatment/Interventions  ADLs/Self Care Home Management;Cryotherapy;Electrical Stimulation;Moist Heat;Ultrasound;Therapeutic activities;Therapeutic exercise;Neuromuscular re-education;Patient/family education;Manual techniques;Scar mobilization;Taping;Dry needling;Passive range of motion;Vasopneumatic Device;Joint Manipulations;Spinal Manipulations    PT Next Visit Plan  Assess HEP, manual to improve PROM as tolerated, progress AAROM exercises as tolerated (sidelying abduction, supine flexion, pulleys)    PT Home Exercise Plan  Supine ER PROM with dowel, Seated forward table slide PROM/AAROM, Seated elbow AROM, Seated upper trap stretch, Shoulder blade squeezes    Consulted and Agree with Plan of Care  Patient       Patient will benefit from skilled therapeutic intervention in order to improve the following deficits and impairments:  Increased muscle spasms, Decreased knowledge of precautions, Decreased range of motion, Increased edema, Decreased activity tolerance, Decreased strength, Impaired UE functional use, Postural dysfunction, Pain  Visit Diagnosis: Chronic right shoulder pain  Stiffness of right shoulder, not elsewhere classified  Muscle weakness (generalized)  Localized edema     Problem List Patient Active Problem List   Diagnosis Date Noted  . Rotator cuff tear 07/28/2019  .  Diabetes ([place]) 04/12/2019  . Acute asthma exacerbation 03/22/2019  . Chest pain of uncertain etiology 123456  . Lactic acidosis 03/22/2019  . Chronic right shoulder pain   . Community acquired pneumonia 10/25/2018  . Asthma, chronic, unspecified asthma severity, with acute exacerbation 10/25/2018  . Acute respiratory failure with hypoxia and hypercapnia ([place]) 10/23/2018  . Acute pain of right shoulder due to trauma 10/05/2018  . Chronic bilateral low back pain without sciatica 10/05/2018  . Dysphonia 10/05/2018  . Severe asthma with exacerbation 10/05/2018  . Rotator cuff arthropathy, right 06/15/2018  . AKI (acute kidney injury) ([place]) 03/06/2018  . Carpal tunnel syndrome 01/18/2018  . Normocytic anemia 05/06/2017  . Tracheostomy status ([place]) 04/28/2017  . Tracheostomy dependent ([place]) 03/26/2017  . Subglottic stenosis 03/06/2017  . Tracheal stenosis 03/04/2017  . Acute blood loss anemia   . Generalized anxiety disorder   . Hypoalbuminemia due to protein-calorie malnutrition ([place])   . Morbid obesity ([place])   . Chronic pain syndrome   . Gastritis and gastroduodenitis 02/22/2016  . Depression 02/22/2016  . Migraine 01/30/2015  . Bulging lumbar disc 09/05/2013  . Essential hypertension 09/05/2013  . Allergic rhinitis, seasonal 08/11/2012  .  Chronic cough 08/11/2012  . Sleep apnea, obstructive 12/04/2011    Hilda Blades, PT, DPT, LAT, ATC 09/14/19  3:42 PM Phone: [phone] Fax: [place] [facility] [address] [place], [place], [address] Phone: [phone]   Fax:  [phone]  Name: Kameah Thresher Bradley MRN: [id number] Date of Birth: January 03, 1971

## 2019-09-15 DIAGNOSIS — J9500 Unspecified tracheostomy complication: Secondary | ICD-10-CM | POA: Diagnosis not present

## 2019-09-19 ENCOUNTER — Encounter: Payer: Self-pay | Admitting: Physical Therapy

## 2019-09-19 ENCOUNTER — Other Ambulatory Visit: Payer: Self-pay

## 2019-09-19 ENCOUNTER — Ambulatory Visit: Payer: Medicare HMO | Admitting: Physical Therapy

## 2019-09-19 DIAGNOSIS — M25511 Pain in right shoulder: Secondary | ICD-10-CM

## 2019-09-19 DIAGNOSIS — M6281 Muscle weakness (generalized): Secondary | ICD-10-CM | POA: Diagnosis not present

## 2019-09-19 DIAGNOSIS — G8929 Other chronic pain: Secondary | ICD-10-CM

## 2019-09-19 DIAGNOSIS — R600 Localized edema: Secondary | ICD-10-CM

## 2019-09-19 DIAGNOSIS — M25611 Stiffness of right shoulder, not elsewhere classified: Secondary | ICD-10-CM

## 2019-09-19 DIAGNOSIS — J9500 Unspecified tracheostomy complication: Secondary | ICD-10-CM | POA: Diagnosis not present

## 2019-09-19 NOTE — Therapy (Signed)
[place], [place], [address] Phone: [phone]   Fax:  [phone]  Physical Therapy Treatment  Patient Details  Name: [name] MRN: [id number] Date of Birth: [birth year] Referring Provider (PT): Marlou Sa Tonna Corner, MD   Encounter Date: 09/19/2019  PT End of Session - 09/19/19 1457    Visit Number  6    Number of Visits  12    Date for PT Re-Evaluation  10/03/19    Authorization Type  Aetna MCR    PT Start Time  [id number]    PT Stop Time  1545    PT Time Calculation (min)  53 min    Activity Tolerance  Patient tolerated treatment well    Behavior During Therapy  [facility] for tasks assessed/performed       Past Medical History:  Diagnosis Date  . Anxiety   . Arthritis   . Asthma   . Chronic back pain   . Chronic chest pain    resolved, no problems since 2019 per patient 07/27/19  . COPD (chronic obstructive pulmonary disease) ([place])   . Depression   . DM (diabetes mellitus) ([place])    INSULIN DEPENDENT - TYPE 1  . GERD (gastroesophageal reflux disease)   . Headache(784.0)   . Hyperlipidemia    no med, diet controlled  . Hypertension   . Hypokalemia   . Respiratory disease 05/2017  . Sleep apnea    does not use CPAP due to trach  . Tracheostomy in place [facility]) 02/2017    Past Surgical History:  Procedure Laterality Date  . ABDOMINAL HYSTERECTOMY  2009  . APPENDECTOMY    . CESAREAN SECTION     x 3  . CHOLECYSTECTOMY N/A 03/02/2014   Procedure: LAPAROSCOPIC CHOLECYSTECTOMY;  Surgeon: Joyice Faster. Cornett, MD;  Location: [place];  Service: General;  Laterality: N/A;  . HERNIA REPAIR    . PANENDOSCOPY N/A 03/04/2017   Procedure: PANENDOSCOPY WITH POSSIBLE FOREIGN BODY REMOVAL;  Surgeon: Jodi Marble, MD;  Location: [place];  Service: ENT;  Laterality: N/A;  . ROTATOR CUFF REPAIR Left   . ROTATOR CUFF REPAIR Right 07/27/2019  . SHOULDER ARTHROSCOPY WITH SUBACROMIAL DECOMPRESSION,  ROTATOR CUFF REPAIR AND BICEP TENDON REPAIR Right 07/28/2019   Procedure: RIGHT SHOULDER ARTHROSCOP, MINI OPEN ROTATOR CUFF TEAR REPAIR,  BICEPS TENODESIS, DISTAL CLAVICLE EXCISION;  Surgeon: Meredith Pel, MD;  Location: [place];  Service: Orthopedics;  Laterality: Right;  . TRACHEOSTOMY  02/2017  . VESICOVAGINAL FISTULA CLOSURE W/ TAH  2009    There were no vitals filed for this visit.  Subjective Assessment - 09/19/19 1455    Subjective  Patient reports she is doing well. Her exercises are going well. She does have occasional increases in pain when she does too much.    Currently in Pain?  Yes    Pain Score  3     Pain Location  Shoulder    Pain Orientation  Right    Pain Descriptors / Indicators  Aching    Pain Type  Surgical pain    Pain Onset  More than a month ago    Pain Frequency  Constant         [facility] PT Assessment - 09/19/19 0001      PROM   Right Shoulder Flexion  130 Degrees    Right Shoulder ABduction  100 Degrees  [place] Adult PT Treatment/Exercise - 09/19/19 0001      Exercises   Exercises  Shoulder      Shoulder Exercises: Supine   Flexion  AAROM;20 reps;10 reps    Flexion Limitations  Supine chest press with dowel x20, straight arm flexion x10 through full range      Shoulder Exercises: Seated   Retraction  10 reps    External Rotation  10 reps    External Rotation Limitations  elbows bent and by side    Other Seated Exercises  Elbow flexion and extension x20      Shoulder Exercises: Sidelying   External Rotation  10 reps    External Rotation Limitations  towel under arm    ABduction  10 reps    ABduction Limitations  cued for controlled motion      Shoulder Exercises: Standing   Flexion  AAROM;20 reps    Flexion Limitations  swiss ball forward roll    ABduction  AAROM;20 reps    ABduction Limitations  swiss ball side ways roll      Shoulder Exercises: Pulleys   Flexion  2 minutes    Scaption  2 minutes       Modalities   Modalities  Vasopneumatic      Vasopneumatic   Number Minutes Vasopneumatic   15 minutes    Vasopnuematic Location   Shoulder    Vasopneumatic Pressure  Low    Vasopneumatic Temperature   34             PT Education - 09/19/19 1457    Education Details  HEP    Person(s) Educated  Patient    Methods  Explanation;Demonstration;Verbal cues;Handout    Comprehension  Verbalized understanding;Returned demonstration;Verbal cues required;Need further instruction       PT Short Term Goals - 09/12/19 1521      PT SHORT TERM GOAL #1   Title  She will be indpendent with initial HEP to make progress in PT and understand post-op precautions.    Time  2    Period  Weeks    Status  On-going    Target Date  09/05/19      PT SHORT TERM GOAL #2   Title  Patient will report pain level < or = 6/10 more than 75% of the time to allow for improved sleep and function.    Time  3    Period  Weeks    Status  Achieved    Target Date  09/12/19      PT SHORT TERM GOAL #3   Title  Patient will exhibit >90 deg passive shoulder elevation without increased pain level to improve motion.    Time  2    Status  Achieved    Target Date  09/05/19        PT Long Term Goals - 09/19/19 1505      PT LONG TERM GOAL #1   Title  She will initiate AROM with no increased pain level to allow for progression in exercises.    Time  6    Period  Weeks    Status  New    Target Date  10/03/19      PT LONG TERM GOAL #2   Title  Patient will exhibit shoulder elevation PROM >120 deg without increased pain level.    Time  6    Period  Weeks    Status  On-going    Target Date  10/03/19  PT LONG TERM GOAL #3   Title  Patient will report < or = 2-3/10 pain level with exercises to allow for improve activity tolerance and progress exercise.    Time  6    Period  Weeks    Status  New    Target Date  10/03/19      PT LONG TERM GOAL #4   Title  FOTO score will be < or = 50% to indicate  improved functional level.    Time  6    Period  Weeks    Status  New    Target Date  10/03/19      PT LONG TERM GOAL #5   Title  Will establish new goals at 6 week assessment            Plan - 09/19/19 1459    Clinical Impression Statement  Patient is progressing well with her exercises and exhibits improve pain level this visit. Her passive motion continues to improve. She does report increased pain at end range and with shoulder motion. She required cueing for proper form with exercises. She would benefit from continued skilled PT to progress her motion and return to PLOF.    PT Treatment/Interventions  ADLs/Self Care Home Management;Cryotherapy;Electrical Stimulation;Moist Heat;Ultrasound;Therapeutic activities;Therapeutic exercise;Neuromuscular re-education;Patient/family education;Manual techniques;Scar mobilization;Taping;Dry needling;Passive range of motion;Vasopneumatic Device;Joint Manipulations;Spinal Manipulations    PT Next Visit Plan  Assess HEP, manual to improve PROM as tolerated, progress AAROM exercises as tolerated (sidelying abduction, supine flexion, pulleys)    PT Home Exercise Plan  Supine ER PROM with dowel, Seated forward table slide PROM/AAROM, Seated elbow AROM, Seated upper trap stretch, Shoulder blade squeezes    Consulted and Agree with Plan of Care  Patient       Patient will benefit from skilled therapeutic intervention in order to improve the following deficits and impairments:  Increased muscle spasms, Decreased knowledge of precautions, Decreased range of motion, Increased edema, Decreased activity tolerance, Decreased strength, Impaired UE functional use, Postural dysfunction, Pain  Visit Diagnosis: Chronic right shoulder pain  Stiffness of right shoulder, not elsewhere classified  Muscle weakness (generalized)  Localized edema     Problem List Patient Active Problem List   Diagnosis Date Noted  . Rotator cuff tear 07/28/2019  . Diabetes  ([place]) 04/12/2019  . Acute asthma exacerbation 03/22/2019  . Chest pain of uncertain etiology 123456  . Lactic acidosis 03/22/2019  . Chronic right shoulder pain   . Community acquired pneumonia 10/25/2018  . Asthma, chronic, unspecified asthma severity, with acute exacerbation 10/25/2018  . Acute respiratory failure with hypoxia and hypercapnia ([place]) 10/23/2018  . Acute pain of right shoulder due to trauma 10/05/2018  . Chronic bilateral low back pain without sciatica 10/05/2018  . Dysphonia 10/05/2018  . Severe asthma with exacerbation 10/05/2018  . Rotator cuff arthropathy, right 06/15/2018  . AKI (acute kidney injury) ([place]) 03/06/2018  . Carpal tunnel syndrome 01/18/2018  . Normocytic anemia 05/06/2017  . Tracheostomy status ([place]) 04/28/2017  . Tracheostomy dependent ([place]) 03/26/2017  . Subglottic stenosis 03/06/2017  . Tracheal stenosis 03/04/2017  . Acute blood loss anemia   . Generalized anxiety disorder   . Hypoalbuminemia due to protein-calorie malnutrition ([place])   . Morbid obesity ([place])   . Chronic pain syndrome   . Gastritis and gastroduodenitis 02/22/2016  . Depression 02/22/2016  . Migraine 01/30/2015  . Bulging lumbar disc 09/05/2013  . Essential hypertension 09/05/2013  . Allergic rhinitis, seasonal 08/11/2012  . Chronic cough 08/11/2012  .  Sleep apnea, obstructive 12/04/2011    Hilda Blades, PT, DPT, LAT, ATC 09/19/19  4:02 PM Phone: [phone] Fax: [facility] [address] [place], [place], [address] Phone: [phone]   Fax:  [phone]  Name: Marenda Bashore Bradley MRN: [id number] Date of Birth: 06/22/1971

## 2019-09-21 ENCOUNTER — Ambulatory Visit: Payer: Medicare HMO | Admitting: Physical Therapy

## 2019-09-21 DIAGNOSIS — J9500 Unspecified tracheostomy complication: Secondary | ICD-10-CM | POA: Diagnosis not present

## 2019-09-22 DIAGNOSIS — J9500 Unspecified tracheostomy complication: Secondary | ICD-10-CM | POA: Diagnosis not present

## 2019-09-28 ENCOUNTER — Other Ambulatory Visit: Payer: Self-pay

## 2019-09-28 ENCOUNTER — Ambulatory Visit (INDEPENDENT_AMBULATORY_CARE_PROVIDER_SITE_OTHER): Payer: Medicare HMO | Admitting: Orthopedic Surgery

## 2019-09-28 DIAGNOSIS — M75121 Complete rotator cuff tear or rupture of right shoulder, not specified as traumatic: Secondary | ICD-10-CM

## 2019-09-28 MED ORDER — HYDROCODONE-ACETAMINOPHEN 5-325 MG PO TABS: 1.0000 | ORAL_TABLET | Freq: Two times a day (BID) | ORAL | 0 refills | Status: DC | PRN

## 2019-09-29 ENCOUNTER — Encounter: Payer: Self-pay | Admitting: Physical Therapy

## 2019-09-29 ENCOUNTER — Ambulatory Visit: Payer: Medicare HMO | Attending: Orthopedic Surgery | Admitting: Physical Therapy

## 2019-09-29 DIAGNOSIS — M6281 Muscle weakness (generalized): Secondary | ICD-10-CM | POA: Diagnosis not present

## 2019-09-29 DIAGNOSIS — G4733 Obstructive sleep apnea (adult) (pediatric): Secondary | ICD-10-CM | POA: Diagnosis not present

## 2019-09-29 DIAGNOSIS — G8929 Other chronic pain: Secondary | ICD-10-CM | POA: Insufficient documentation

## 2019-09-29 DIAGNOSIS — J9610 Chronic respiratory failure, unspecified whether with hypoxia or hypercapnia: Secondary | ICD-10-CM | POA: Diagnosis not present

## 2019-09-29 DIAGNOSIS — J45909 Unspecified asthma, uncomplicated: Secondary | ICD-10-CM | POA: Diagnosis not present

## 2019-09-29 DIAGNOSIS — J449 Chronic obstructive pulmonary disease, unspecified: Secondary | ICD-10-CM | POA: Diagnosis not present

## 2019-09-29 DIAGNOSIS — R600 Localized edema: Secondary | ICD-10-CM | POA: Insufficient documentation

## 2019-09-29 DIAGNOSIS — M25511 Pain in right shoulder: Secondary | ICD-10-CM | POA: Insufficient documentation

## 2019-09-29 DIAGNOSIS — M25611 Stiffness of right shoulder, not elsewhere classified: Secondary | ICD-10-CM

## 2019-09-29 DIAGNOSIS — Z930 Tracheostomy status: Secondary | ICD-10-CM | POA: Diagnosis not present

## 2019-09-29 NOTE — Therapy (Signed)
[place], [place], [address] Phone: [phone]   Fax:  [phone]  Physical Therapy Treatment  Patient Details  Name: [name] MRN: [id number] Date of Birth: [birth year] Referring Provider (PT): Marlou Sa Tonna Corner, MD   Encounter Date: 09/29/2019  PT End of Session - 09/29/19 1321    Visit Number  7    Number of Visits  12    Date for PT Re-Evaluation  10/03/19    Authorization Type  Aetna MCR    PT Start Time  [id number]    PT Stop Time  1402    PT Time Calculation (min)  47 min       Past Medical History:  Diagnosis Date  . Anxiety   . Arthritis   . Asthma   . Chronic back pain   . Chronic chest pain    resolved, no problems since 2019 per patient 07/27/19  . COPD (chronic obstructive pulmonary disease) ([place])   . Depression   . DM (diabetes mellitus) ([place])    INSULIN DEPENDENT - TYPE 1  . GERD (gastroesophageal reflux disease)   . Headache(784.0)   . Hyperlipidemia    no med, diet controlled  . Hypertension   . Hypokalemia   . Respiratory disease 05/2017  . Sleep apnea    does not use CPAP due to trach  . Tracheostomy in place [facility]) 02/2017    Past Surgical History:  Procedure Laterality Date  . ABDOMINAL HYSTERECTOMY  2009  . APPENDECTOMY    . CESAREAN SECTION     x 3  . CHOLECYSTECTOMY N/A 03/02/2014   Procedure: LAPAROSCOPIC CHOLECYSTECTOMY;  Surgeon: Joyice Faster. Cornett, MD;  Location: [place];  Service: General;  Laterality: N/A;  . HERNIA REPAIR    . PANENDOSCOPY N/A 03/04/2017   Procedure: PANENDOSCOPY WITH POSSIBLE FOREIGN BODY REMOVAL;  Surgeon: Jodi Marble, MD;  Location: [place];  Service: ENT;  Laterality: N/A;  . ROTATOR CUFF REPAIR Left   . ROTATOR CUFF REPAIR Right 07/27/2019  . SHOULDER ARTHROSCOPY WITH SUBACROMIAL DECOMPRESSION, ROTATOR CUFF REPAIR AND BICEP TENDON REPAIR Right 07/28/2019   Procedure: RIGHT SHOULDER ARTHROSCOP, MINI OPEN ROTATOR CUFF  TEAR REPAIR,  BICEPS TENODESIS, DISTAL CLAVICLE EXCISION;  Surgeon: Meredith Pel, MD;  Location: [place];  Service: Orthopedics;  Laterality: Right;  . TRACHEOSTOMY  02/2017  . VESICOVAGINAL FISTULA CLOSURE W/ TAH  2009    There were no vitals filed for this visit.  Subjective Assessment - 09/29/19 1319    Subjective  I have been without my pain meds for a few days so I am a 8/10 today plus the doctor moved it all around and made it hurt more. I just picked up a refill on my pain meds.    Currently in Pain?  Yes    Pain Score  8     Pain Location  Shoulder    Pain Orientation  Right    Pain Descriptors / Indicators  Throbbing    Pain Type  Surgical pain    Aggravating Factors   without pain meds , MD moving it around, doing too much    Pain Relieving Factors  pain meds, ice , rest                       [facility] Adult PT Treatment/Exercise - 09/29/19 0001      Shoulder Exercises: Supine   Flexion  AAROM;20 reps;10 reps  Flexion Limitations  Supine chest press with dowel x20, straight arm flexion x10 through full range      Shoulder Exercises: Seated   Retraction  10 reps    External Rotation  10 reps    External Rotation Limitations  elbows bent and by side    Other Seated Exercises  elbow flexion 1#      Shoulder Exercises: Sidelying   External Rotation  20 reps    External Rotation Limitations  towel under arm      Shoulder Exercises: Standing   Flexion  AAROM;20 reps    Flexion Limitations  swiss ball forward roll    ABduction  AAROM;20 reps    ABduction Limitations  swiss ball side ways roll    Retraction  20 reps      Shoulder Exercises: Pulleys   Flexion  2 minutes    Scaption  2 minutes      Vasopneumatic   Number Minutes Vasopneumatic   15 minutes    Vasopnuematic Location   Shoulder    Vasopneumatic Pressure  Low    Vasopneumatic Temperature   34      Manual Therapy   Manual Therapy  Passive ROM    Passive ROM  Supine shoulder in all  directions, elbow               PT Short Term Goals - 09/12/19 1521      PT SHORT TERM GOAL #1   Title  She will be indpendent with initial HEP to make progress in PT and understand post-op precautions.    Time  2    Period  Weeks    Status  On-going    Target Date  09/05/19      PT SHORT TERM GOAL #2   Title  Patient will report pain level < or = 6/10 more than 75% of the time to allow for improved sleep and function.    Time  3    Period  Weeks    Status  Achieved    Target Date  09/12/19      PT SHORT TERM GOAL #3   Title  Patient will exhibit >90 deg passive shoulder elevation without increased pain level to improve motion.    Time  2    Status  Achieved    Target Date  09/05/19        PT Long Term Goals - 09/19/19 1505      PT LONG TERM GOAL #1   Title  She will initiate AROM with no increased pain level to allow for progression in exercises.    Time  6    Period  Weeks    Status  New    Target Date  10/03/19      PT LONG TERM GOAL #2   Title  Patient will exhibit shoulder elevation PROM >120 deg without increased pain level.    Time  6    Period  Weeks    Status  On-going    Target Date  10/03/19      PT LONG TERM GOAL #3   Title  Patient will report < or = 2-3/10 pain level with exercises to allow for improve activity tolerance and progress exercise.    Time  6    Period  Weeks    Status  New    Target Date  10/03/19      PT LONG TERM GOAL #4   Title  FOTO score will  be < or = 50% to indicate improved functional level.    Time  6    Period  Weeks    Status  New    Target Date  10/03/19      PT LONG TERM GOAL #5   Title  Will establish new goals at 6 week assessment            Plan - 09/29/19 1326    Clinical Impression Statement  Pt reports MD appointment yesterday and states MD wanted to begin strengthening in PT. She is having increased pain today due to not having pain meds and the MD moving her arm around and makeing her lift  her arm.    PT Next Visit Plan  Assess HEP, manual to improve PROM as tolerated, progress AAROM exercises as tolerated (sidelying abduction, supine flexion, pulleys)    PT Home Exercise Plan  Supine ER PROM with dowel, Seated forward table slide PROM/AAROM, Seated elbow AROM, Seated upper trap stretch, Shoulder blade squeezes       Patient will benefit from skilled therapeutic intervention in order to improve the following deficits and impairments:  Increased muscle spasms, Decreased knowledge of precautions, Decreased range of motion, Increased edema, Decreased activity tolerance, Decreased strength, Impaired UE functional use, Postural dysfunction, Pain  Visit Diagnosis: Chronic right shoulder pain  Stiffness of right shoulder, not elsewhere classified  Muscle weakness (generalized)  Localized edema     Problem List Patient Active Problem List   Diagnosis Date Noted  . Rotator cuff tear 07/28/2019  . Diabetes ([place]) 04/12/2019  . Acute asthma exacerbation 03/22/2019  . Chest pain of uncertain etiology 123456  . Lactic acidosis 03/22/2019  . Chronic right shoulder pain   . Community acquired pneumonia 10/25/2018  . Asthma, chronic, unspecified asthma severity, with acute exacerbation 10/25/2018  . Acute respiratory failure with hypoxia and hypercapnia ([place]) 10/23/2018  . Acute pain of right shoulder due to trauma 10/05/2018  . Chronic bilateral low back pain without sciatica 10/05/2018  . Dysphonia 10/05/2018  . Severe asthma with exacerbation 10/05/2018  . Rotator cuff arthropathy, right 06/15/2018  . AKI (acute kidney injury) ([place]) 03/06/2018  . Carpal tunnel syndrome 01/18/2018  . Normocytic anemia 05/06/2017  . Tracheostomy status ([place]) 04/28/2017  . Tracheostomy dependent ([place]) 03/26/2017  . Subglottic stenosis 03/06/2017  . Tracheal stenosis 03/04/2017  . Acute blood loss anemia   . Generalized anxiety disorder   . Hypoalbuminemia due to protein-calorie  malnutrition ([place])   . Morbid obesity ([place])   . Chronic pain syndrome   . Gastritis and gastroduodenitis 02/22/2016  . Depression 02/22/2016  . Migraine 01/30/2015  . Bulging lumbar disc 09/05/2013  . Essential hypertension 09/05/2013  . Allergic rhinitis, seasonal 08/11/2012  . Chronic cough 08/11/2012  . Sleep apnea, obstructive 12/04/2011    Dorene Ar, PTA 09/29/2019, 1:53 PM  [facility] [address] [place], [place], [address] Phone: [phone]   Fax:  [phone]  Name: [name] MRN: [id number] Date of Birth: May 24, 1971

## 2019-09-30 ENCOUNTER — Encounter: Payer: Self-pay | Admitting: Orthopedic Surgery

## 2019-09-30 NOTE — Progress Notes (Signed)
Post-Op Visit Note   Patient: [name]           Date of Birth: July 27, 1971           MRN: [id number] Visit Date: 09/28/2019 PCP: [name], MD   Assessment & Plan:  Chief Complaint:  Chief Complaint  Patient presents with  . Follow-up   Visit Diagnoses:  1. Nontraumatic complete tear of right rotator cuff     Plan: Patient is a 49 year old female who presents s/p right shoulder distal clavicle excision rotator cuff tear repair with biceps tenodesis.  She is about 8 weeks postop.  She notes that her pain is progressing steadily.  She is still on the CPM machine and is up to 90 degrees.  She has been going to physical therapy 2 times a week and is currently transitioning to 1 time a week.  She is doing this at [facility] physical therapy.  They have begun working on strengthening with her, mostly working on range of motion up until this point.  On exam her incision have healed well and she has good range of motion with some weakness of the supraspinatus.  This is expected at this point.  Refilled pain medication for Norco 5 mg every 12 hours as needed.  Patient will follow up in 6 weeks for clinical recheck.  Follow-Up Instructions: No follow-ups on file.   Orders:  No orders of the defined types were placed in this encounter.  Meds ordered this encounter  Medications  . HYDROcodone-acetaminophen (NORCO/VICODIN) 5-325 MG tablet    Sig: Take 1 tablet by mouth every 12 (twelve) hours as needed for moderate pain.    Dispense:  30 tablet    Refill:  0    Imaging: No results found.  PMFS History: Patient Active Problem List   Diagnosis Date Noted  . Rotator cuff tear 07/28/2019  . Diabetes ([place]) 04/12/2019  . Acute asthma exacerbation 03/22/2019  . Chest pain of uncertain etiology 123456  . Lactic acidosis 03/22/2019  . Chronic right shoulder pain   . Community acquired pneumonia 10/25/2018  . Asthma, chronic, unspecified asthma severity, with  acute exacerbation 10/25/2018  . Acute respiratory failure with hypoxia and hypercapnia ([place]) 10/23/2018  . Acute pain of right shoulder due to trauma 10/05/2018  . Chronic bilateral low back pain without sciatica 10/05/2018  . Dysphonia 10/05/2018  . Severe asthma with exacerbation 10/05/2018  . Rotator cuff arthropathy, right 06/15/2018  . AKI (acute kidney injury) ([place]) 03/06/2018  . Carpal tunnel syndrome 01/18/2018  . Normocytic anemia 05/06/2017  . Tracheostomy status ([place]) 04/28/2017  . Tracheostomy dependent ([place]) 03/26/2017  . Subglottic stenosis 03/06/2017  . Tracheal stenosis 03/04/2017  . Acute blood loss anemia   . Generalized anxiety disorder   . Hypoalbuminemia due to protein-calorie malnutrition ([place])   . Morbid obesity ([place])   . Chronic pain syndrome   . Gastritis and gastroduodenitis 02/22/2016  . Depression 02/22/2016  . Migraine 01/30/2015  . Bulging lumbar disc 09/05/2013  . Essential hypertension 09/05/2013  . Allergic rhinitis, seasonal 08/11/2012  . Chronic cough 08/11/2012  . Sleep apnea, obstructive 12/04/2011   Past Medical History:  Diagnosis Date  . Anxiety   . Arthritis   . Asthma   . Chronic back pain   . Chronic chest pain    resolved, no problems since 2019 per patient 07/27/19  . COPD (chronic obstructive pulmonary disease) ([place])   . Depression   . DM (diabetes mellitus) ([place])  INSULIN DEPENDENT - TYPE 1  . GERD (gastroesophageal reflux disease)   . Headache(784.0)   . Hyperlipidemia    no med, diet controlled  . Hypertension   . Hypokalemia   . Respiratory disease 05/2017  . Sleep apnea    does not use CPAP due to trach  . Tracheostomy in place [facility]) 02/2017    Family History  Problem Relation Age of Onset  . Heart attack Mother   . Stroke Mother   . Diabetes Mother   . Hypertension Mother   . Arthritis Mother   . Stroke Father   . Hypertension Sister   . Diabetes Sister   . Seizures Brother   . Diabetes Brother       Past Surgical History:  Procedure Laterality Date  . ABDOMINAL HYSTERECTOMY  2009  . APPENDECTOMY    . CESAREAN SECTION     x 3  . CHOLECYSTECTOMY N/A 03/02/2014   Procedure: LAPAROSCOPIC CHOLECYSTECTOMY;  Surgeon: Joyice Faster. Cornett, MD;  Location: [place];  Service: General;  Laterality: N/A;  . HERNIA REPAIR    . PANENDOSCOPY N/A 03/04/2017   Procedure: PANENDOSCOPY WITH POSSIBLE FOREIGN BODY REMOVAL;  Surgeon: Jodi Marble, MD;  Location: [place];  Service: ENT;  Laterality: N/A;  . ROTATOR CUFF REPAIR Left   . ROTATOR CUFF REPAIR Right 07/27/2019  . SHOULDER ARTHROSCOPY WITH SUBACROMIAL DECOMPRESSION, ROTATOR CUFF REPAIR AND BICEP TENDON REPAIR Right 07/28/2019   Procedure: RIGHT SHOULDER ARTHROSCOP, MINI OPEN ROTATOR CUFF TEAR REPAIR,  BICEPS TENODESIS, DISTAL CLAVICLE EXCISION;  Surgeon: Meredith Pel, MD;  Location: [place];  Service: Orthopedics;  Laterality: Right;  . TRACHEOSTOMY  02/2017  . VESICOVAGINAL FISTULA CLOSURE W/ TAH  2009   Social History   Occupational History  . Not on file  Tobacco Use  . Smoking status: Former Smoker    Packs/day: 0.25    Years: 22.00    Pack years: 5.50    Types: Cigarettes    Quit date: 01/20/2017    Years since quitting: 2.6  . Smokeless tobacco: Never Used  Substance and Sexual Activity  . Alcohol use: Yes    Alcohol/week: 0.0 standard drinks    Comment: socially 1-2 glasses wine  . Drug use: Yes    Types: Marijuana    Comment: Last Use Jul 13, 2019  . Sexual activity: Yes    Partners: Male    Birth control/protection: None

## 2019-10-04 ENCOUNTER — Ambulatory Visit: Payer: Medicare HMO | Attending: Family Medicine | Admitting: Family Medicine

## 2019-10-04 ENCOUNTER — Other Ambulatory Visit: Payer: Self-pay

## 2019-10-04 DIAGNOSIS — Z930 Tracheostomy status: Secondary | ICD-10-CM | POA: Diagnosis not present

## 2019-10-04 DIAGNOSIS — A084 Viral intestinal infection, unspecified: Secondary | ICD-10-CM

## 2019-10-04 DIAGNOSIS — F330 Major depressive disorder, recurrent, mild: Secondary | ICD-10-CM

## 2019-10-04 DIAGNOSIS — J4540 Moderate persistent asthma, uncomplicated: Secondary | ICD-10-CM

## 2019-10-04 DIAGNOSIS — I10 Essential (primary) hypertension: Secondary | ICD-10-CM | POA: Diagnosis not present

## 2019-10-04 DIAGNOSIS — R0789 Other chest pain: Secondary | ICD-10-CM

## 2019-10-04 DIAGNOSIS — G43119 Migraine with aura, intractable, without status migrainosus: Secondary | ICD-10-CM | POA: Diagnosis not present

## 2019-10-04 DIAGNOSIS — E7800 Pure hypercholesterolemia, unspecified: Secondary | ICD-10-CM | POA: Diagnosis not present

## 2019-10-04 DIAGNOSIS — A09 Infectious gastroenteritis and colitis, unspecified: Secondary | ICD-10-CM

## 2019-10-04 DIAGNOSIS — R69 Illness, unspecified: Secondary | ICD-10-CM | POA: Diagnosis not present

## 2019-10-04 MED ORDER — SERTRALINE HCL 100 MG PO TABS: 100.0000 mg | ORAL_TABLET | Freq: Every day | ORAL | 1 refills | Status: DC

## 2019-10-04 MED ORDER — LISINOPRIL 10 MG PO TABS: 10.0000 mg | ORAL_TABLET | Freq: Every day | ORAL | 1 refills | Status: DC

## 2019-10-04 MED ORDER — SUMATRIPTAN SUCCINATE 100 MG PO TABS: ORAL_TABLET | ORAL | 1 refills | Status: DC

## 2019-10-04 MED ORDER — ALBUTEROL SULFATE HFA 108 (90 BASE) MCG/ACT IN AERS: 2.0000 | INHALATION_SPRAY | Freq: Four times a day (QID) | RESPIRATORY_TRACT | 6 refills | Status: DC | PRN

## 2019-10-04 MED ORDER — MONTELUKAST SODIUM 10 MG PO TABS: 10.0000 mg | ORAL_TABLET | Freq: Every day | ORAL | 1 refills | Status: DC

## 2019-10-04 MED ORDER — CARVEDILOL 25 MG PO TABS: 25.0000 mg | ORAL_TABLET | Freq: Two times a day (BID) | ORAL | 1 refills | Status: DC

## 2019-10-04 MED ORDER — AMLODIPINE BESYLATE 10 MG PO TABS: 10.0000 mg | ORAL_TABLET | Freq: Every day | ORAL | 1 refills | Status: DC

## 2019-10-04 MED ORDER — TIZANIDINE HCL 4 MG PO TABS: 4.0000 mg | ORAL_TABLET | Freq: Three times a day (TID) | ORAL | 3 refills | Status: DC | PRN

## 2019-10-04 MED ORDER — HYDROXYZINE HCL 25 MG PO TABS: 25.0000 mg | ORAL_TABLET | Freq: Three times a day (TID) | ORAL | 1 refills | Status: DC | PRN

## 2019-10-04 MED ORDER — ONDANSETRON HCL 4 MG PO TABS: 4.0000 mg | ORAL_TABLET | Freq: Three times a day (TID) | ORAL | 0 refills | Status: DC | PRN

## 2019-10-04 MED ORDER — BUDESONIDE-FORMOTEROL FUMARATE 160-4.5 MCG/ACT IN AERO: 2.0000 | INHALATION_SPRAY | Freq: Two times a day (BID) | RESPIRATORY_TRACT | 6 refills | Status: DC

## 2019-10-04 MED ORDER — ATORVASTATIN CALCIUM 20 MG PO TABS: 20.0000 mg | ORAL_TABLET | Freq: Every day | ORAL | 1 refills | Status: DC

## 2019-10-04 MED ORDER — BUPROPION HCL ER (SR) 150 MG PO TB12: 150.0000 mg | ORAL_TABLET | Freq: Two times a day (BID) | ORAL | 6 refills | Status: DC

## 2019-10-04 NOTE — Progress Notes (Signed)
Virtual Visit via Telephone Note  I connected with [name], on 10/04/2019 at 1:35 PM by telephone due to the COVID-19 pandemic and verified that I am speaking with the correct person using two identifiers.   Consent: I discussed the limitations, risks, security and privacy concerns of performing an evaluation and management service by telephone and the availability of in person appointments. I also discussed with the patient that there may be a patient responsible charge related to this service. The patient expressed understanding and agreed to proceed.   Location of Patient: Home  Location of Provider: Clinic   Persons participating in Telemedicine visit: [name] [name] Dr. Margarita Rana     History of Present Illness: [name]  is a 49 year old female with a history of type 2 diabetes mellitus (A1c 7.5), hypertension, obstructive sleep apnea, asthma, depression, s/p tracheostomy secondary to intubation related tracheal injury, subglottic stenosis (status post balloon dilatation and Kenalog injection of stenosis tracheostomy tube exchange by ENT, status post right rotator cuff surgery in 07/2019 seen for follow-up visit.   She has been feeling sick in her stomach for the last few days with nausea, vomiting, no fever; she had diarrhea last night and sometimes has slight abdominal cramping. She does not eat out and denies history of sick contact. Unable to keep anything down.  Denies presence of upper respiratory symptoms.  For her shoulder she is undergoing PT and her strength is improving. She is also seeing Endocrinology, her A1c has improved to 7.5 down from 10.1 previously. Saw ENT in the fall of last year for management of her trach; she has abnormal taste and smell and for her to taste she has to have a lot of seasoning. Still has pain at the site of the trach and it keeps her throat dry.  Migraines are stable  on her current regimen and her anxiety and depression have fluctuated with the ongoing pandemic however she tries to stay home and avoid contact with people besides her children and grandchildren.  Past Medical History:  Diagnosis Date  . Anxiety   . Arthritis   . Asthma   . Chronic back pain   . Chronic chest pain    resolved, no problems since 2019 per patient 07/27/19  . COPD (chronic obstructive pulmonary disease) ([place])   . Depression   . DM (diabetes mellitus) ([place])    INSULIN DEPENDENT - TYPE 1  . GERD (gastroesophageal reflux disease)   . Headache(784.0)   . Hyperlipidemia    no med, diet controlled  . Hypertension   . Hypokalemia   . Respiratory disease 05/2017  . Sleep apnea    does not use CPAP due to trach  . Tracheostomy in place [facility]) 02/2017   Allergies  Allergen Reactions  . Reglan [Metoclopramide] Other (See Comments)    Panic attack    Current Outpatient Medications on File Prior to Visit  Medication Sig Dispense Refill  . acetaminophen (TYLENOL) 325 MG tablet Take 1-2 tablets (325-650 mg total) by mouth every 6 (six) hours as needed for mild pain (pain score 1-3 or temp > 100.5). 60 tablet 0  . albuterol (PROAIR HFA) 108 (90 Base) MCG/ACT inhaler Inhale 2 puffs into the lungs every 6 (six) hours as needed for wheezing or shortness of breath. 1 Inhaler 3  . albuterol (PROVENTIL) (2.5 MG/3ML) 0.083% nebulizer solution Take 3 mLs (2.5 mg total) by nebulization every 6 (six) hours as needed for wheezing or shortness of breath.  75 mL 3  . amLODipine (NORVASC) 10 MG tablet Take 1 tablet (10 mg total) by mouth daily. 90 tablet 1  . aspirin EC 81 MG tablet Take 1 tablet (81 mg total) by mouth daily. 14 tablet 0  . atorvastatin (LIPITOR) 20 MG tablet Take 1 tablet (20 mg total) by mouth daily. 90 tablet 1  . Blood Glucose Monitoring Suppl (ONETOUCH VERIO) w/Device KIT 1 each by Does not apply route daily. Use to monitor glucose levels daily; E11.9 1 kit 0  .  budesonide-formoterol (SYMBICORT) 160-4.5 MCG/ACT inhaler Inhale 2 puffs into the lungs 2 (two) times daily. 3 Inhaler 1  . buPROPion (WELLBUTRIN SR) 150 MG 12 hr tablet Take 1 tablet (150 mg total) by mouth 2 (two) times daily. 60 tablet 3  . carvedilol (COREG) 25 MG tablet Take 1 tablet (25 mg total) by mouth 2 (two) times daily with a meal. 180 tablet 1  . furosemide (LASIX) 20 MG tablet Take 1 tablet (20 mg total) by mouth daily. 30 tablet 2  . gabapentin (NEURONTIN) 300 MG capsule Take 2 capsules (600 mg total) by mouth 2 (two) times daily. 360 capsule 1  . glucose blood (ONETOUCH VERIO) test strip Use to monitor glucose levels daily; E11.9 100 each 2  . HYDROcodone-acetaminophen (NORCO/VICODIN) 5-325 MG tablet Take 1 tablet by mouth every 12 (twelve) hours as needed for moderate pain. 30 tablet 0  . hydrOXYzine (ATARAX/VISTARIL) 25 MG tablet Take 1 tablet (25 mg total) by mouth 3 (three) times daily as needed for anxiety. 60 tablet 1  . Insulin Degludec (TRESIBA FLEXTOUCH) 200 UNIT/ML SOPN Inject 140 Units into the skin daily. 12 pen 11  . Insulin Pen Needle (FIFTY50 PEN NEEDLES) 31G X 8 MM MISC Inject 1 Units as directed daily.    Marland Kitchen lisinopril (ZESTRIL) 10 MG tablet Take 1 tablet (10 mg total) by mouth daily. 90 tablet 1  . Melatonin 3 MG TABS Take 3 mg by mouth at bedtime as needed (for sleep).     . montelukast (SINGULAIR) 10 MG tablet Take 1 tablet (10 mg total) by mouth at bedtime. 90 tablet 1  . naproxen (NAPROSYN) 500 MG tablet Take 1 tablet (500 mg total) by mouth 2 (two) times daily with a meal. (Patient not taking: Reported on 08/22/2019) 60 tablet 0  . OneTouch Delica Lancets 47S MISC 1 each by Does not apply route daily. Use to monitor glucose levels daily; E11.9 100 each 2  . oxyCODONE (OXY IR/ROXICODONE) 5 MG immediate release tablet Take 1 tablet (5 mg total) by mouth every 12 (twelve) hours as needed for moderate pain (pain score 4-6). 35 tablet 0  . Semaglutide, 1 MG/DOSE,  (OZEMPIC, 1 MG/DOSE,) 2 MG/1.5ML SOPN Inject 1 mg into the skin once a week. 2 pen 11  . sertraline (ZOLOFT) 100 MG tablet Take 1 tablet (100 mg total) by mouth at bedtime. 90 tablet 1  . SUMAtriptan (IMITREX) 100 MG tablet At the onset of a migraine; may repeat in 2 hours. Max daily dose 247m 20 tablet 1  . tiZANidine (ZANAFLEX) 4 MG tablet Take 1 tablet (4 mg total) by mouth every 8 (eight) hours as needed for muscle spasms. 90 tablet 3  . traMADol (ULTRAM) 50 MG tablet Take 1 tablet (50 mg total) by mouth 2 (two) times daily. (Patient not taking: Reported on 08/22/2019) 30 tablet 0  . [DISCONTINUED] fluticasone-salmeterol (ADVAIR HFA) 230-21 MCG/ACT inhaler Inhale 2 puffs into the lungs 2 (two) times daily. 1  Inhaler 3   No current facility-administered medications on file prior to visit.    Observations/Objective: Alert, awake, oriented x3 Not in acute distress  Lab Results  Component Value Date   HGBA1C 7.5 (A) 07/11/2019    Lipid Panel     Component Value Date/Time   CHOL 165 06/15/2018 1031   TRIG 124 06/15/2018 1031   HDL 51 06/15/2018 1031   CHOLHDL 3.2 06/15/2018 1031   CHOLHDL 3.9 02/22/2016 0954   VLDL 26 02/22/2016 0954   LDLCALC 89 06/15/2018 1031   LABVLDL 25 06/15/2018 1031    CMP Latest Ref Rng & Units 07/28/2019 04/27/2019 03/24/2019  Glucose 70 - 99 mg/dL 130(H) 138(H) 212(H)  BUN 6 - 20 mg/dL 9 19 19   Creatinine 0.44 - 1.00 mg/dL 0.99 1.11(H) 1.08(H)  Sodium 135 - 145 mmol/L 137 138 140  Potassium 3.5 - 5.1 mmol/L 3.7 4.1 3.7  Chloride 98 - 111 mmol/L 105 107 108  CO2 22 - 32 mmol/L 21(L) 23 22  Calcium 8.9 - 10.3 mg/dL 9.1 8.9 8.6(L)  Total Protein 6.5 - 8.1 g/dL - - -  Total Bilirubin 0.3 - 1.2 mg/dL - - -  Alkaline Phos 38 - 126 U/L - - -  AST 15 - 41 U/L - - -  ALT 0 - 44 U/L - - -    Assessment and Plan: 1. Benign essential HTN Stable Continue current regimen Counseled on blood pressure goal of less than 130/80, low-sodium, DASH diet,  medication compliance, 150 minutes of moderate intensity exercise per week. Discussed medication compliance, adverse effects. - amLODipine (NORVASC) 10 MG tablet; Take 1 tablet (10 mg total) by mouth daily.  Dispense: 90 tablet; Refill: 1 - lisinopril (ZESTRIL) 10 MG tablet; Take 1 tablet (10 mg total) by mouth daily.  Dispense: 90 tablet; Refill: 1 - carvedilol (COREG) 25 MG tablet; Take 1 tablet (25 mg total) by mouth 2 (two) times daily with a meal.  Dispense: 180 tablet; Refill: 1  2. Pure hypercholesterolemia Controlled She is due for lipid panel We will order at next visit - atorvastatin (LIPITOR) 20 MG tablet; Take 1 tablet (20 mg total) by mouth daily.  Dispense: 90 tablet; Refill: 1  3. Musculoskeletal chest pain Stable - tiZANidine (ZANAFLEX) 4 MG tablet; Take 1 tablet (4 mg total) by mouth every 8 (eight) hours as needed for muscle spasms.  Dispense: 90 tablet; Refill: 3  4. Intractable migraine with aura without status migrainosus Controlled - SUMAtriptan (IMITREX) 100 MG tablet; At the onset of a migraine; may repeat in 2 hours. Max daily dose 271m  Dispense: 20 tablet; Refill: 1  5. Mild episode of recurrent major depressive disorder ([place] Exacerbated by ongoing pandemic She has the support of her family at this time Consider LCSW referral for psychotherapy if worsening Continue current regimen - sertraline (ZOLOFT) 100 MG tablet; Take 1 tablet (100 mg total) by mouth at bedtime.  Dispense: 90 tablet; Refill: 1 - hydrOXYzine (ATARAX/VISTARIL) 25 MG tablet; Take 1 tablet (25 mg total) by mouth 3 (three) times daily as needed for anxiety.  Dispense: 60 tablet; Refill: 1 - buPROPion (WELLBUTRIN SR) 150 MG 12 hr tablet; Take 1 tablet (150 mg total) by mouth 2 (two) times daily.  Dispense: 60 tablet; Refill: 6  6. Moderate persistent asthma without complication No recent exacerbations - montelukast (SINGULAIR) 10 MG tablet; Take 1 tablet (10 mg total) by mouth at bedtime.   Dispense: 90 tablet; Refill: 1 - budesonide-formoterol (SYMBICORT) 160-4.5 MCG/ACT  inhaler; Inhale 2 puffs into the lungs 2 (two) times daily.  Dispense: 3 Inhaler; Refill: 6 - albuterol (PROAIR HFA) 108 (90 Base) MCG/ACT inhaler; Inhale 2 puffs into the lungs every 6 (six) hours as needed for wheezing or shortness of breath.  Dispense: 18 g; Refill: 6  7. Viral gastroenteritis Increase fluid intake Educated on BRAT diet - ondansetron (ZOFRAN) 4 MG tablet; Take 1 tablet (4 mg total) by mouth every 8 (eight) hours as needed for nausea or vomiting.  Dispense: 20 tablet; Refill: 0  8.  Tracheostomy  Uncontrolled with persisting pain Secondary to subglottic stenosis She does have muscle relaxants, Vicodin and antitussives Follow-up with ENT  follow Up Instructions: 3 months   I discussed the assessment and treatment plan with the patient. The patient was provided an opportunity to ask questions and all were answered. The patient agreed with the plan and demonstrated an understanding of the instructions.   The patient was advised to call back or seek an in-person evaluation if the symptoms worsen or if the condition fails to improve as anticipated.     I provided 40 minutes total of non-face-to-face time during this encounter including median intraservice time, reviewing previous notes, investigations, ordering medications, medical decision making, coordinating care and patient verbalized understanding at the end of the visit.     [name], MD, FAAFP. [facility] and [facility], [place]   10/04/2019, 1:35 PM

## 2019-10-04 NOTE — Progress Notes (Signed)
Patient has been called and DOB has been verified. Patient has been screened and transferred to PCP to start phone visit.   Nausea, throwing up, headaches.no fever.

## 2019-10-05 ENCOUNTER — Other Ambulatory Visit: Payer: Self-pay

## 2019-10-05 ENCOUNTER — Encounter: Payer: Self-pay | Admitting: Physical Therapy

## 2019-10-05 ENCOUNTER — Encounter: Payer: Self-pay | Admitting: Family Medicine

## 2019-10-05 ENCOUNTER — Ambulatory Visit: Payer: Medicare HMO | Admitting: Physical Therapy

## 2019-10-05 DIAGNOSIS — M6281 Muscle weakness (generalized): Secondary | ICD-10-CM

## 2019-10-05 DIAGNOSIS — M25611 Stiffness of right shoulder, not elsewhere classified: Secondary | ICD-10-CM | POA: Diagnosis not present

## 2019-10-05 DIAGNOSIS — R600 Localized edema: Secondary | ICD-10-CM

## 2019-10-05 DIAGNOSIS — G8929 Other chronic pain: Secondary | ICD-10-CM | POA: Diagnosis not present

## 2019-10-05 DIAGNOSIS — M25511 Pain in right shoulder: Secondary | ICD-10-CM | POA: Diagnosis not present

## 2019-10-05 NOTE — Therapy (Signed)
[place], [place], [address] Phone: [phone]   Fax:  [phone]  Physical Therapy Treatment  Progress Note Reporting Period 08/22/2019 to 10/05/2019  See note below for Objective Data and Assessment of Progress/Goals.      Patient Details  Name: [name] MRN: [id number] Date of Birth: [birth year] Referring Provider (PT): Marlou Sa Tonna Corner, MD   Encounter Date: 10/05/2019  PT End of Session - 10/05/19 1545    Visit Number  8    Number of Visits  16    Date for PT Re-Evaluation  11/30/19    Authorization Type  [employer] MCR    PT Start Time  1530    PT Stop Time  1625    PT Time Calculation (min)  55 min    Activity Tolerance  Patient tolerated treatment well    Behavior During Therapy  [facility] for tasks assessed/performed       Past Medical History:  Diagnosis Date  . Anxiety   . Arthritis   . Asthma   . Chronic back pain   . Chronic chest pain    resolved, no problems since 2019 per patient 07/27/19  . COPD (chronic obstructive pulmonary disease) ([place])   . Depression   . DM (diabetes mellitus) ([place])    INSULIN DEPENDENT - TYPE 1  . GERD (gastroesophageal reflux disease)   . Headache(784.0)   . Hyperlipidemia    no med, diet controlled  . Hypertension   . Hypokalemia   . Respiratory disease 05/2017  . Sleep apnea    does not use CPAP due to trach  . Tracheostomy in place [facility]) 02/2017    Past Surgical History:  Procedure Laterality Date  . ABDOMINAL HYSTERECTOMY  2009  . APPENDECTOMY    . CESAREAN SECTION     x 3  . CHOLECYSTECTOMY N/A 03/02/2014   Procedure: LAPAROSCOPIC CHOLECYSTECTOMY;  Surgeon: Joyice Faster. Cornett, MD;  Location: [place];  Service: General;  Laterality: N/A;  . HERNIA REPAIR    . PANENDOSCOPY N/A 03/04/2017   Procedure: PANENDOSCOPY WITH POSSIBLE FOREIGN BODY REMOVAL;  Surgeon: Jodi Marble, MD;  Location: [place];  Service: ENT;  Laterality:  N/A;  . ROTATOR CUFF REPAIR Left   . ROTATOR CUFF REPAIR Right 07/27/2019  . SHOULDER ARTHROSCOPY WITH SUBACROMIAL DECOMPRESSION, ROTATOR CUFF REPAIR AND BICEP TENDON REPAIR Right 07/28/2019   Procedure: RIGHT SHOULDER ARTHROSCOP, MINI OPEN ROTATOR CUFF TEAR REPAIR,  BICEPS TENODESIS, DISTAL CLAVICLE EXCISION;  Surgeon: Meredith Pel, MD;  Location: [place];  Service: Orthopedics;  Laterality: Right;  . TRACHEOSTOMY  02/2017  . VESICOVAGINAL FISTULA CLOSURE W/ TAH  2009    There were no vitals filed for this visit.  Subjective Assessment - 10/05/19 1536    Subjective  Patient reports she has been doing well. Her exercises are going well at home.    Currently in Pain?  Yes    Pain Score  7     Pain Location  Shoulder    Pain Orientation  Right    Pain Descriptors / Indicators  Spasm   pinching   Pain Type  Surgical pain    Pain Onset  More than a month ago    Pain Frequency  Intermittent         [facility] PT Assessment - 10/05/19 0001      Assessment   Medical Diagnosis  Right rotator cuff repair, distal clavicle excission, bicep tenodesis    Referring  Provider (PT)  Meredith Pel, MD    Onset Date/Surgical Date  07/28/19    Hand Dominance  Right      Precautions   Precautions  None      Balance Screen   Has the patient fallen in the past 6 months  No      Prior Function   Level of Independence  Independent with basic ADLs      Observation/Other Assessments   Observations  Patient in no apparent distress      ROM / Strength   AROM / PROM / Strength  AROM;PROM      AROM   AROM Assessment Site  Shoulder    Right/Left Shoulder  Right    Right Shoulder Flexion  120 Degrees      PROM   PROM Assessment Site  Shoulder    Right/Left Shoulder  Right    Right Shoulder Flexion  140 Degrees    Right Shoulder ABduction  110 Degrees    Right Shoulder Internal Rotation  60 Degrees   at 90 deg abd   Right Shoulder External Rotation  75 Degrees   at 45 and 90 deg abd                   [facility] Adult PT Treatment/Exercise - 10/05/19 0001      Exercises   Exercises  Shoulder      Shoulder Exercises: Supine   Flexion  AAROM    Flexion Limitations  Supine chest press with dowel x10, straight arm flexion full range x10      Shoulder Exercises: Seated   Elevation  10 reps   2 sets   Elevation Limitations  Table incline 45 deg straight arm flexion through full range      Shoulder Exercises: Sidelying   External Rotation  20 reps    External Rotation Limitations  towel under arm    ABduction  10 reps    ABduction Limitations  VC for technique and control      Shoulder Exercises: Standing   Flexion  AAROM;20 reps    Flexion Limitations  ranger flexion through full range    ABduction  AAROM;20 reps    ABduction Limitations  ranger flexion through full range    Row  10 reps   2 sets     Shoulder Exercises: Pulleys   Flexion  3 minutes      Modalities   Modalities  Vasopneumatic;Electrical Stimulation      Electrical Stimulation   Electrical Stimulation Location  Right Shoulder    Electrical Stimulation Action  IFC 80-150    Electrical Stimulation Parameters  Patient's tolerance    Electrical Stimulation Goals  Pain      Vasopneumatic   Number Minutes Vasopneumatic   15 minutes    Vasopnuematic Location   Shoulder    Vasopneumatic Pressure  Medium    Vasopneumatic Temperature   34      Manual Therapy   Manual Therapy  Passive ROM    Passive ROM  Supine shoulder in all directions, elbow             PT Education - 10/05/19 1544    Education Details  HEP    Person(s) Educated  Patient    Methods  Explanation;Demonstration;Verbal cues;Tactile cues;Handout    Comprehension  Verbalized understanding;Returned demonstration;Verbal cues required;Tactile cues required;Need further instruction       PT Short Term Goals - 10/05/19 1709  PT SHORT TERM GOAL #1   Title  She will be indpendent with initial HEP to make  progress in PT and understand post-op precautions.    Time  2    Period  Weeks    Status  Achieved    Target Date  09/05/19      PT SHORT TERM GOAL #2   Title  Patient will report pain level < or = 6/10 more than 75% of the time to allow for improved sleep and function.    Time  3    Period  Weeks    Status  Achieved    Target Date  09/12/19      PT SHORT TERM GOAL #3   Title  Patient will exhibit >90 deg passive shoulder elevation without increased pain level to improve motion.    Time  2    Status  Achieved    Target Date  09/05/19        PT Long Term Goals - 10/05/19 1710      PT LONG TERM GOAL #1   Title  She will exhibit AROM WFL in all directions in order to allow for reaching overhead cabinets.    Time  8    Period  Weeks    Status  Revised    Target Date  11/30/19      PT LONG TERM GOAL #2   Title  Patient will exhibit shoulder elevation PROM >120 deg without increased pain level.    Time  6    Period  Weeks    Status  Achieved      PT LONG TERM GOAL #3   Title  Patient will report < or = 3-4/10 pain level with exercises to allow for improve activity tolerance and progress exercise.    Time  8    Period  Weeks    Status  Revised    Target Date  11/30/19      PT LONG TERM GOAL #4   Title  FOTO score will be < or = 50% to indicate improved functional level.    Time  8    Period  Weeks    Status  Revised    Target Date  11/30/19            Plan - 10/05/19 1606    Clinical Impression Statement  Patient is doing well with her range of motion and exhibits continued improvement, and is tolerating progressons in AAROM>AROM well. She does continue to have relative high levels of pain but she seems to be progressing well overall. She would benefit from continued skilled PT to progress her motion and initiate strengthening as tolerated.    PT Frequency  1x / week    PT Duration  8 weeks    PT Treatment/Interventions  ADLs/Self Care Home  Management;Cryotherapy;Electrical Stimulation;Moist Heat;Ultrasound;Therapeutic activities;Therapeutic exercise;Neuromuscular re-education;Patient/family education;Manual techniques;Scar mobilization;Taping;Dry needling;Passive range of motion;Vasopneumatic Device;Joint Manipulations;Spinal Manipulations    PT Next Visit Plan  Assess HEP, manual to improve PROM as tolerated, progress AAROM>AROM exercises as tolerated (sidelying abduction, supine flexion, pulleys)    PT Home Exercise Plan  Supine ER PROM with dowel, Seated forward table slide PROM/AAROM, Seated elbow AROM, Seated upper trap stretch, Shoulder blade squeezes    Consulted and Agree with Plan of Care  Patient       Patient will benefit from skilled therapeutic intervention in order to improve the following deficits and impairments:  Increased muscle spasms, Decreased knowledge of precautions, Decreased  range of motion, Increased edema, Decreased activity tolerance, Decreased strength, Impaired UE functional use, Postural dysfunction, Pain  Visit Diagnosis: Chronic right shoulder pain  Stiffness of right shoulder, not elsewhere classified  Muscle weakness (generalized)  Localized edema     Problem List Patient Active Problem List   Diagnosis Date Noted  . Rotator cuff tear 07/28/2019  . Diabetes ([place]) 04/12/2019  . Acute asthma exacerbation 03/22/2019  . Chest pain of uncertain etiology 123456  . Lactic acidosis 03/22/2019  . Chronic right shoulder pain   . Community acquired pneumonia 10/25/2018  . Asthma, chronic, unspecified asthma severity, with acute exacerbation 10/25/2018  . Acute respiratory failure with hypoxia and hypercapnia ([place]) 10/23/2018  . Acute pain of right shoulder due to trauma 10/05/2018  . Chronic bilateral low back pain without sciatica 10/05/2018  . Dysphonia 10/05/2018  . Severe asthma with exacerbation 10/05/2018  . Rotator cuff arthropathy, right 06/15/2018  . AKI (acute kidney injury)  ([place]) 03/06/2018  . Carpal tunnel syndrome 01/18/2018  . Normocytic anemia 05/06/2017  . Tracheostomy status ([place]) 04/28/2017  . Tracheostomy dependent ([place]) 03/26/2017  . Subglottic stenosis 03/06/2017  . Tracheal stenosis 03/04/2017  . Acute blood loss anemia   . Generalized anxiety disorder   . Hypoalbuminemia due to protein-calorie malnutrition ([place])   . Morbid obesity ([place])   . Chronic pain syndrome   . Gastritis and gastroduodenitis 02/22/2016  . Depression 02/22/2016  . Migraine 01/30/2015  . Bulging lumbar disc 09/05/2013  . Essential hypertension 09/05/2013  . Allergic rhinitis, seasonal 08/11/2012  . Chronic cough 08/11/2012  . Sleep apnea, obstructive 12/04/2011    Hilda Blades, PT, DPT, LAT, ATC 10/05/19  5:15 PM Phone: [phone] Fax: [facility] [place] [address] [place], [place], [address] Phone: [phone]   Fax:  [phone]  Name: Cheryce [place] Bradley MRN: [id number] Date of Birth: 09-05-1971

## 2019-10-09 ENCOUNTER — Emergency Department (HOSPITAL_COMMUNITY)
Admission: EM | Admit: 2019-10-09 | Discharge: 2019-10-10 | Disposition: A | Discharge status: Home / Self Care | Payer: MEDICARE | Attending: Emergency Medicine | Admitting: Emergency Medicine

## 2019-10-09 ENCOUNTER — Other Ambulatory Visit: Payer: Self-pay

## 2019-10-09 ENCOUNTER — Emergency Department (HOSPITAL_COMMUNITY): Payer: MEDICARE

## 2019-10-09 ENCOUNTER — Encounter (HOSPITAL_COMMUNITY): Payer: Self-pay | Admitting: *Deleted

## 2019-10-09 DIAGNOSIS — Z79899 Other long term (current) drug therapy: Secondary | ICD-10-CM | POA: Insufficient documentation

## 2019-10-09 DIAGNOSIS — Z794 Long term (current) use of insulin: Secondary | ICD-10-CM | POA: Diagnosis not present

## 2019-10-09 DIAGNOSIS — J449 Chronic obstructive pulmonary disease, unspecified: Secondary | ICD-10-CM | POA: Insufficient documentation

## 2019-10-09 DIAGNOSIS — E119 Type 2 diabetes mellitus without complications: Secondary | ICD-10-CM | POA: Insufficient documentation

## 2019-10-09 DIAGNOSIS — K529 Noninfective gastroenteritis and colitis, unspecified: Secondary | ICD-10-CM | POA: Diagnosis not present

## 2019-10-09 DIAGNOSIS — I10 Essential (primary) hypertension: Secondary | ICD-10-CM | POA: Insufficient documentation

## 2019-10-09 DIAGNOSIS — Z7982 Long term (current) use of aspirin: Secondary | ICD-10-CM | POA: Diagnosis not present

## 2019-10-09 DIAGNOSIS — R1013 Epigastric pain: Secondary | ICD-10-CM | POA: Diagnosis present

## 2019-10-09 DIAGNOSIS — Z87891 Personal history of nicotine dependence: Secondary | ICD-10-CM | POA: Insufficient documentation

## 2019-10-09 DIAGNOSIS — E860 Dehydration: Secondary | ICD-10-CM | POA: Insufficient documentation

## 2019-10-09 DIAGNOSIS — R1110 Vomiting, unspecified: Secondary | ICD-10-CM | POA: Diagnosis not present

## 2019-10-09 LAB — COMPREHENSIVE METABOLIC PANEL
ALT: 26 U/L (ref 0–44)
AST: 18 U/L (ref 15–41)
Albumin: 4 g/dL (ref 3.5–5.0)
Alkaline Phosphatase: 85 U/L (ref 38–126)
Anion gap: 16 — ABNORMAL HIGH (ref 5–15)
BUN: 21 mg/dL — ABNORMAL HIGH (ref 6–20)
CO2: 20 mmol/L — ABNORMAL LOW (ref 22–32)
Calcium: 9.4 mg/dL (ref 8.9–10.3)
Chloride: 104 mmol/L (ref 98–111)
Creatinine, Ser: 1.69 mg/dL — ABNORMAL HIGH (ref 0.44–1.00)
GFR calc Af Amer: 40 mL/min — ABNORMAL LOW (ref >60)
GFR calc non Af Amer: 35 mL/min — ABNORMAL LOW (ref >60)
Glucose, Bld: 169 mg/dL — ABNORMAL HIGH (ref 70–99)
Potassium: 3.4 mmol/L — ABNORMAL LOW (ref 3.5–5.1)
Sodium: 140 mmol/L (ref 135–145)
Total Bilirubin: 0.7 mg/dL (ref 0.3–1.2)
Total Protein: 8.1 g/dL (ref 6.5–8.1)

## 2019-10-09 LAB — CBC
HCT: 41.3 % (ref 36.0–46.0)
Hemoglobin: 13.5 g/dL (ref 12.0–15.0)
MCH: 27.1 pg (ref 26.0–34.0)
MCHC: 32.7 g/dL (ref 30.0–36.0)
MCV: 82.8 fL (ref 80.0–100.0)
Platelets: 418 10*3/uL — ABNORMAL HIGH (ref 150–400)
RBC: 4.99 MIL/uL (ref 3.87–5.11)
RDW: 14.2 % (ref 11.5–15.5)
WBC: 11.1 10*3/uL — ABNORMAL HIGH (ref 4.0–10.5)
nRBC: 0 % (ref 0.0–0.2)

## 2019-10-09 LAB — LIPASE, BLOOD: Lipase: 25 U/L (ref 11–51)

## 2019-10-09 MED ORDER — IOHEXOL 300 MG/ML  SOLN
80.0000 mL | Freq: Once | INTRAMUSCULAR | Status: AC | PRN
  Administered 2019-10-09: 80 mL via INTRAVENOUS

## 2019-10-09 MED ORDER — SODIUM CHLORIDE 0.9% FLUSH
3.0000 mL | Freq: Once | INTRAVENOUS | Status: AC
  Administered 2019-10-09: 22:00:00 3 mL via INTRAVENOUS

## 2019-10-09 MED ORDER — SODIUM CHLORIDE 0.9 % IV BOLUS
1000.0000 mL | Freq: Once | INTRAVENOUS | Status: AC
  Administered 2019-10-09: 1000 mL via INTRAVENOUS

## 2019-10-09 MED ORDER — HYDROMORPHONE HCL 1 MG/ML IJ SOLN
1.0000 mg | Freq: Once | INTRAMUSCULAR | Status: AC
  Administered 2019-10-09: 22:00:00 1 mg via INTRAVENOUS
  Filled 2019-10-09: qty 1

## 2019-10-09 MED ORDER — ONDANSETRON 4 MG PO TBDP
4.0000 mg | ORAL_TABLET | Freq: Once | ORAL | Status: AC | PRN
  Administered 2019-10-09: 22:00:00 4 mg via ORAL
  Filled 2019-10-09: qty 1

## 2019-10-09 MED ORDER — PROMETHAZINE HCL 25 MG/ML IJ SOLN
12.5000 mg | Freq: Once | INTRAMUSCULAR | Status: AC
  Administered 2019-10-09: 22:00:00 12.5 mg via INTRAVENOUS
  Filled 2019-10-09: qty 1

## 2019-10-09 MED ORDER — FAMOTIDINE IN NACL 20-0.9 MG/50ML-% IV SOLN
20.0000 mg | INTRAVENOUS | Status: AC
  Administered 2019-10-09: 20 mg via INTRAVENOUS
  Filled 2019-10-09: qty 50

## 2019-10-09 NOTE — ED Provider Notes (Signed)
[facility] EMERGENCY DEPARTMENT Provider Note   CSN: [id number] Arrival date & time: 10/09/19  2018     History Chief Complaint  Patient presents with  . Abdominal Pain    [name] is a 49 y.o. female.  49 year old female with a history of DM, HTN, OSA, asthma, depression, s/p tracheostomy secondary to intubation related tracheal injury, subglottic stenosis presents to the ED for c/o abdominal pain, N/V/D.  Patient has been experiencing nausea, vomiting, diarrhea for the past week.  Her symptoms acutely worsened in the last 1 to 2 days.  She has had too numerous to count episodes of emesis today.  One emesis was streaked with bright red blood.  Unable to tolerate any food or fluids today.  She describes her diarrhea as watery, nonbloody.  Her abdominal pain worsened today and has been constant in her epigastrium, radiating throughout her abdomen.  She has been using Zofran prescribed by her primary care doctor without relief.  Denies any sick contacts, fevers, dysuria, hematuria, hematemesis.  Abdominal surgical history significant for abdominal hysterectomy, appendectomy, cholecystectomy, C-section x3, hernia repair.  Denies illicit drug and marijuana use.  The history is provided by the patient. No language interpreter was used.  Abdominal Pain      Past Medical History:  Diagnosis Date  . Anxiety   . Arthritis   . Asthma   . Chronic back pain   . Chronic chest pain    resolved, no problems since 2019 per patient 07/27/19  . COPD (chronic obstructive pulmonary disease) ([place])   . Depression   . DM (diabetes mellitus) ([place])    INSULIN DEPENDENT - TYPE 1  . GERD (gastroesophageal reflux disease)   . Headache(784.0)   . Hyperlipidemia    no med, diet controlled  . Hypertension   . Hypokalemia   . Respiratory disease 05/2017  . Sleep apnea    does not use CPAP due to trach  . Tracheostomy in place [facility]) 02/2017    Patient Active  Problem List   Diagnosis Date Noted  . Rotator cuff tear 07/28/2019  . Diabetes ([place]) 04/12/2019  . Acute asthma exacerbation 03/22/2019  . Chest pain of uncertain etiology 59/56/3875  . Lactic acidosis 03/22/2019  . Chronic right shoulder pain   . Community acquired pneumonia 10/25/2018  . Asthma, chronic, unspecified asthma severity, with acute exacerbation 10/25/2018  . Acute respiratory failure with hypoxia and hypercapnia ([place]) 10/23/2018  . Acute pain of right shoulder due to trauma 10/05/2018  . Chronic bilateral low back pain without sciatica 10/05/2018  . Dysphonia 10/05/2018  . Severe asthma with exacerbation 10/05/2018  . Rotator cuff arthropathy, right 06/15/2018  . AKI (acute kidney injury) ([place]) 03/06/2018  . Carpal tunnel syndrome 01/18/2018  . Normocytic anemia 05/06/2017  . Tracheostomy status ([place]) 04/28/2017  . Tracheostomy dependent ([place]) 03/26/2017  . Subglottic stenosis 03/06/2017  . Tracheal stenosis 03/04/2017  . Acute blood loss anemia   . Generalized anxiety disorder   . Hypoalbuminemia due to protein-calorie malnutrition ([place])   . Morbid obesity ([place])   . Chronic pain syndrome   . Gastritis and gastroduodenitis 02/22/2016  . Depression 02/22/2016  . Migraine 01/30/2015  . Bulging lumbar disc 09/05/2013  . Essential hypertension 09/05/2013  . Allergic rhinitis, seasonal 08/11/2012  . Chronic cough 08/11/2012  . Sleep apnea, obstructive 12/04/2011    Past Surgical History:  Procedure Laterality Date  . ABDOMINAL HYSTERECTOMY  2009  . APPENDECTOMY    . CESAREAN  SECTION     x 3  . CHOLECYSTECTOMY N/A 03/02/2014   Procedure: LAPAROSCOPIC CHOLECYSTECTOMY;  Surgeon: Joyice Faster. Cornett, MD;  Location: [place];  Service: General;  Laterality: N/A;  . HERNIA REPAIR    . PANENDOSCOPY N/A 03/04/2017   Procedure: PANENDOSCOPY WITH POSSIBLE FOREIGN BODY REMOVAL;  Surgeon: Jodi Marble, MD;  Location: [place];  Service: ENT;  Laterality: N/A;  . ROTATOR CUFF  REPAIR Left   . ROTATOR CUFF REPAIR Right 07/27/2019  . SHOULDER ARTHROSCOPY WITH SUBACROMIAL DECOMPRESSION, ROTATOR CUFF REPAIR AND BICEP TENDON REPAIR Right 07/28/2019   Procedure: RIGHT SHOULDER ARTHROSCOP, MINI OPEN ROTATOR CUFF TEAR REPAIR,  BICEPS TENODESIS, DISTAL CLAVICLE EXCISION;  Surgeon: Meredith Pel, MD;  Location: [name];  Service: Orthopedics;  Laterality: Right;  . TRACHEOSTOMY  02/2017  . VESICOVAGINAL FISTULA CLOSURE W/ TAH  2009     OB History    Gravida  4   Para  3   Term  3   Preterm      AB  1   Living  3     SAB      TAB      Ectopic  1   Multiple      Live Births  3           Family History  Problem Relation Age of Onset  . Heart attack Mother   . Stroke Mother   . Diabetes Mother   . Hypertension Mother   . Arthritis Mother   . Stroke Father   . Hypertension Sister   . Diabetes Sister   . Seizures Brother   . Diabetes Brother     Social History   Tobacco Use  . Smoking status: Former Smoker    Packs/day: 0.25    Years: 22.00    Pack years: 5.50    Types: Cigarettes    Quit date: 01/20/2017    Years since quitting: 2.7  . Smokeless tobacco: Never Used  Substance Use Topics  . Alcohol use: Yes    Alcohol/week: 0.0 standard drinks    Comment: socially 1-2 glasses wine  . Drug use: Yes    Types: Marijuana    Comment: Last Use Jul 13, 2019    Home Medications Prior to Admission medications   Medication Sig Start Date End Date Taking? Authorizing Provider  acetaminophen (TYLENOL) 325 MG tablet Take 1-2 tablets (325-650 mg total) by mouth every 6 (six) hours as needed for mild pain (pain score 1-3 or temp > 100.5). 07/29/19   Meredith Pel, MD  albuterol [facility] HFA) 108 (90 Base) MCG/ACT inhaler Inhale 2 puffs into the lungs every 6 (six) hours as needed for wheezing or shortness of breath. 10/04/19   [name], MD  albuterol (PROVENTIL) (2.5 MG/3ML) 0.083% nebulizer solution Take 3 mLs (2.5 mg total) by  nebulization every 6 (six) hours as needed for wheezing or shortness of breath. 05/19/19   [name], MD  amLODipine (NORVASC) 10 MG tablet Take 1 tablet (10 mg total) by mouth daily. 10/04/19   [name], MD  aspirin EC 81 MG tablet Take 1 tablet (81 mg total) by mouth daily. 07/28/19 07/27/20  Magnant, Charles L, PA-C  atorvastatin (LIPITOR) 20 MG tablet Take 1 tablet (20 mg total) by mouth daily. 10/04/19   [name], MD  Blood Glucose Monitoring Suppl (ONETOUCH VERIO) w/Device KIT 1 each by Does not apply route daily. Use to monitor glucose levels daily; E11.9 04/14/19   Loanne Drilling,  Hilliard Clark, MD  budesonide-formoterol [facility]) 160-4.5 MCG/ACT inhaler Inhale 2 puffs into the lungs 2 (two) times daily. 10/04/19   [name], MD  buPROPion (WELLBUTRIN SR) 150 MG 12 hr tablet Take 1 tablet (150 mg total) by mouth 2 (two) times daily. 10/04/19   [name], MD  carvedilol (COREG) 25 MG tablet Take 1 tablet (25 mg total) by mouth 2 (two) times daily with a meal. 10/04/19   [name], MD  dicyclomine (BENTYL) 20 MG tablet Take 1 tablet (20 mg total) by mouth every 12 (twelve) hours as needed (for abdominal pain/cramping). 10/10/19   Antonietta Breach, PA-C  furosemide (LASIX) 20 MG tablet Take 1 tablet (20 mg total) by mouth daily. 03/31/19   [name], MD  gabapentin (NEURONTIN) 300 MG capsule Take 2 capsules (600 mg total) by mouth 2 (two) times daily. 10/29/18   Domenic Polite, MD  glucose blood [facility] VERIO) test strip Use to monitor glucose levels daily; E11.9 09/14/19   Renato Shin, MD  HYDROcodone-acetaminophen (NORCO/VICODIN) 5-325 MG tablet Take 1 tablet by mouth every 12 (twelve) hours as needed for moderate pain. 09/28/19   Magnant, Charles L, PA-C  hydrOXYzine (ATARAX/VISTARIL) 25 MG tablet Take 1 tablet (25 mg total) by mouth 3 (three) times daily as needed for anxiety. 10/04/19   [name], MD  Insulin Degludec (TRESIBA FLEXTOUCH) 200 UNIT/ML SOPN Inject 140 Units  into the skin daily. 07/11/19   Renato Shin, MD  Insulin Pen Needle (FIFTY50 PEN NEEDLES) 31G X 8 MM MISC Inject 1 Units as directed daily. 07/30/18   [provider]  lisinopril (ZESTRIL) 10 MG tablet Take 1 tablet (10 mg total) by mouth daily. 10/04/19   [name], MD  Melatonin 3 MG TABS Take 3 mg by mouth at bedtime as needed (for sleep).  10/31/17   [provider]  montelukast (SINGULAIR) 10 MG tablet Take 1 tablet (10 mg total) by mouth at bedtime. 10/04/19   [name], MD  naproxen (NAPROSYN) 500 MG tablet Take 1 tablet (500 mg total) by mouth 2 (two) times daily with a meal. Patient not taking: Reported on 08/22/2019 04/13/19   Chevis Pretty, FNP  ondansetron (ZOFRAN) 4 MG tablet Take 1 tablet (4 mg total) by mouth every 8 (eight) hours as needed for nausea or vomiting. 10/04/19   [name], MD  OneTouch Delica Lancets 55D MISC 1 each by Does not apply route daily. Use to monitor glucose levels daily; E11.9 04/14/19   Renato Shin, MD  oxyCODONE (OXY IR/ROXICODONE) 5 MG immediate release tablet Take 1 tablet (5 mg total) by mouth every 12 (twelve) hours as needed for moderate pain (pain score 4-6). 09/06/19   Magnant, Charles L, PA-C  pantoprazole (PROTONIX) 20 MG tablet Take 1 tablet (20 mg total) by mouth daily. 10/10/19   Antonietta Breach, PA-C  promethazine (PHENERGAN) 25 MG tablet Take 1 tablet (25 mg total) by mouth every 6 (six) hours as needed for nausea or vomiting. 10/10/19   Antonietta Breach, PA-C  Semaglutide, 1 MG/DOSE, (OZEMPIC, 1 MG/DOSE,) 2 MG/1.5ML SOPN Inject 1 mg into the skin once a week. 07/11/19   Renato Shin, MD  sertraline (ZOLOFT) 100 MG tablet Take 1 tablet (100 mg total) by mouth at bedtime. 10/04/19   [name], MD  SUMAtriptan (IMITREX) 100 MG tablet At the onset of a migraine; may repeat in 2 hours. Max daily dose 266m 10/04/19   NCharlott Rakes MD  tiZANidine (ZANAFLEX) 4 MG tablet Take 1 tablet (4  mg total) by mouth every 8  (eight) hours as needed for muscle spasms. 10/04/19   [name], MD  traMADol (ULTRAM) 50 MG tablet Take 1 tablet (50 mg total) by mouth 2 (two) times daily. Patient not taking: Reported on 08/22/2019 07/29/19   Meredith Pel, MD  fluticasone-salmeterol (ADVAIR HFA) [phone] MCG/ACT inhaler Inhale 2 puffs into the lungs 2 (two) times daily. 10/15/17 10/15/17  [name], MD    Allergies    Reglan [metoclopramide]  Review of Systems   Review of Systems  Gastrointestinal: Positive for abdominal pain.  Ten systems reviewed and are negative for acute change, except as noted in the HPI.    Physical Exam Updated Vital Signs BP (!) 159/85   Pulse (!) 104   Temp 99.6 F (37.6 C) (Oral)   Resp 18   Ht 5' 1"  (1.549 m)   Wt 108.9 kg   LMP  (LMP Unknown)   SpO2 100%   BMI 45.35 kg/m   Physical Exam Vitals and nursing note reviewed.  Constitutional:      General: She is not in acute distress.    Appearance: She is well-developed. She is not diaphoretic.     Comments: Patient appears uncomfortable.  HENT:     Head: Normocephalic and atraumatic.     Mouth/Throat:     Comments: Mildly dry mm Eyes:     General: No scleral icterus.    Conjunctiva/sclera: Conjunctivae normal.  Neck:     Comments: Tracheostomy in place. Cardiovascular:     Rate and Rhythm: Regular rhythm. Tachycardia present.     Pulses: Normal pulses.     Comments: Mild tachycardia Pulmonary:     Effort: Pulmonary effort is normal. No respiratory distress.     Comments: Respirations even and unlabored Abdominal:     Palpations: Abdomen is soft.     Comments: TTP in the epigastrium, LUQ, LLQ. No masses, guarding, peritoneal signs.  Musculoskeletal:        General: Normal range of motion.     Cervical back: Normal range of motion.  Skin:    General: Skin is warm and dry.     Coloration: Skin is not pale.     Findings: No erythema or rash.  Neurological:     Mental Status: She is alert and oriented  to person, place, and time.  Psychiatric:        Behavior: Behavior normal.     ED Results / Procedures / Treatments   Labs (all labs ordered are listed, but only abnormal results are displayed) Labs Reviewed  COMPREHENSIVE METABOLIC PANEL - Abnormal; Notable for the following components:      Result Value   Potassium 3.4 (*)    CO2 20 (*)    Glucose, Bld 169 (*)    BUN 21 (*)    Creatinine, Ser 1.69 (*)    GFR calc non Af Amer 35 (*)    GFR calc Af Amer 40 (*)    Anion gap 16 (*)    All other components within normal limits  CBC - Abnormal; Notable for the following components:   WBC 11.1 (*)    Platelets 418 (*)    All other components within normal limits  URINALYSIS, ROUTINE W REFLEX MICROSCOPIC - Abnormal; Notable for the following components:   Specific Gravity, Urine >1.046 (*)    Ketones, ur 20 (*)    Protein, ur 30 (*)    Bacteria, UA RARE (*)    All  other components within normal limits  LIPASE, BLOOD    EKG None  Radiology CT ABDOMEN PELVIS W CONTRAST  Result Date: 10/09/2019 CLINICAL DATA:  Abdominal pain. Periumbilical pain. Nausea and vomiting. EXAM: CT ABDOMEN AND PELVIS WITH CONTRAST TECHNIQUE: Multidetector CT imaging of the abdomen and pelvis was performed using the standard protocol following bolus administration of intravenous contrast. CONTRAST:  32m OMNIPAQUE IOHEXOL 300 MG/ML  SOLN COMPARISON:  CT 10/12/2015 FINDINGS: Lower chest: Lung bases are clear. Hepatobiliary: Prominent size liver. Diffusely decreased hepatic density consistent with steatosis. More focal hypodensity adjacent to the falciform ligament likely represents geographic fatty deposition. Postcholecystectomy. No biliary dilatation. Pancreas: No ductal dilatation or inflammation. Spleen: Normal in size without focal abnormality. Adrenals/Urinary Tract: No adrenal nodule. No hydronephrosis or perinephric edema. Homogeneous renal enhancement with symmetric excretion on delayed phase imaging.  Early excretion into the renal collecting systems bilaterally. Slight lobulated renal contours. Urinary bladder is partially distended without wall thickening. Stomach/Bowel: Stomach is unremarkable, no gastric wall thickening. No small bowel obstruction or inflammatory change. Cecum is located in the midline pelvis. Prior appendectomy per history. Small volume of stool in the proximal colon. The remainder the colon is decompressed. No colonic wall thickening or inflammation. Vascular/Lymphatic: Normal caliber abdominal aorta. Portal vein is patent. No acute vascular findings. No adenopathy. Reproductive: Post hysterectomy. Multi cystic lesion in the left adnexa measures 6.5 x 3.9 cm, this may represent a cystic lesion with septations versus multiple adjacent cysts. Right ovary is quiescent. Other: No free air or free fluid. Musculoskeletal: There are no acute or suspicious osseous abnormalities. IMPRESSION: 1. No acute abnormality in the abdomen/pelvis. 2. Multi cystic lesion in the left adnexa measures 6.5 x 3.9 cm, may represent single septated cystic lesion versus multiple adjacent cysts. This is new from 2017. Recommend pelvic ultrasound for further evaluation. This could be performed on an elective basis, and is unlikely to be related to acute umbilical pain. 3. Hepatic steatosis. Electronically Signed   By: MKeith RakeM.D.   On: 10/09/2019 23:20    Procedures Procedures (including critical care time)  Medications Ordered in ED Medications  pantoprazole (PROTONIX) injection 40 mg (40 mg Intravenous Refused 10/10/19 0248)  sodium chloride flush (NS) 0.9 % injection 3 mL (3 mLs Intravenous Given 10/09/19 2227)  ondansetron (ZOFRAN-ODT) disintegrating tablet 4 mg (4 mg Oral Given 10/09/19 2135)  HYDROmorphone (DILAUDID) injection 1 mg (1 mg Intravenous Given 10/09/19 2219)  famotidine (PEPCID) IVPB 20 mg premix (0 mg Intravenous Stopped 10/10/19 0018)  promethazine (PHENERGAN) injection 12.5 mg  (12.5 mg Intravenous Given 10/09/19 2225)  sodium chloride 0.9 % bolus 1,000 mL (0 mLs Intravenous Stopped 10/10/19 0018)  iohexol (OMNIPAQUE) 300 MG/ML solution 80 mL (80 mLs Intravenous Contrast Given 10/09/19 2304)  sodium chloride 0.9 % bolus 1,000 mL (0 mLs Intravenous Stopped 10/10/19 0249)  alum & mag hydroxide-simeth (MAALOX/MYLANTA) 200-200-20 MG/5ML suspension 30 mL (30 mLs Oral Given 10/10/19 0114)    And  lidocaine (XYLOCAINE) 2 % viscous mouth solution 15 mL (15 mLs Oral Given 10/10/19 0114)  haloperidol lactate (HALDOL) injection 2 mg (2 mg Intravenous Given 10/10/19 0118)  dicyclomine (BENTYL) injection 10 mg (10 mg Intramuscular Given 10/10/19 0116)    ED Course  I have reviewed the triage vital signs and the nursing notes.  Pertinent labs & imaging results that were available during my care of the patient were reviewed by me and considered in my medical decision making (see chart for details).  1:30 AM  Patient  with initial improvement in pain. Pain has returned. Will give additional symptomatic medications. 2nd IVF bolus ordered. She has been tolerating PO. Patient updated on CT results and need for UA to exclude UTI vs pyelonephritis. She verbalizes understanding.   2:06 AM Patient ambulatory to bathroom. Had diarrhea x 2. No UA provided, per RN. Recommended bladder scan to assess for retention. RN verbalizes understanding.  2:20 AM Bladder scan with 354cc.   2:50 AM UA obtained and sent. Patient declines additional medications.   3:35 AM Patient resting comfortably, talking with friend on cell phone.  She reports that her pain has improved.  She has been able to tolerate oral fluids without vomiting.  Is requesting discharge at this time.  I feel this is reasonable.  We will have her follow-up with her primary care doctor.      MDM Rules/Calculators/A&P                      Patient with symptoms consistent with viral gastroenteritis.  Vitals are stable, no fever.   Labs are suggestive of dehydration. Patient given 2L of IVF. She is now tolerating PO fluids after antiemetics.  Lungs are clear.  CT performed which is negative for acute process in the abdomen and pelvis. Supportive therapy indicated with return if symptoms worsen.  Will discharge with Bentyl, Phenergan, Protonix. Return precautions discussed and provided. Patient discharged in stable condition with no unaddressed concerns.  Vitals:   10/10/19 0045 10/10/19 0100 10/10/19 0115 10/10/19 0300  BP: (!) 174/124 (!) 189/107 121/78 (!) 159/85  Pulse: 82   (!) 104  Resp: (!) 22 19 18 18   Temp:      TempSrc:      SpO2:    100%  Weight:      Height:        Final Clinical Impression(s) / ED Diagnoses Final diagnoses:  Gastroenteritis    Rx / DC Orders ED Discharge Orders         Ordered    dicyclomine (BENTYL) 20 MG tablet  Every 12 hours PRN     10/10/19 0335    promethazine (PHENERGAN) 25 MG tablet  Every 6 hours PRN     10/10/19 0335    pantoprazole (PROTONIX) 20 MG tablet  Daily     10/10/19 0335           Antonietta Breach, PA-C 10/10/19 0601    Palumbo, April, MD 10/10/19 [id number]

## 2019-10-09 NOTE — ED Triage Notes (Signed)
The pt has a permanent trach for a long time  She is having severe abd pain for 3-4 days with vomiting in triage  No diapphea

## 2019-10-09 NOTE — ED Notes (Signed)
Patient transported to CT 

## 2019-10-10 DIAGNOSIS — K529 Noninfective gastroenteritis and colitis, unspecified: Secondary | ICD-10-CM | POA: Diagnosis not present

## 2019-10-10 LAB — URINALYSIS, ROUTINE W REFLEX MICROSCOPIC
Bilirubin Urine: NEGATIVE
Glucose, UA: NEGATIVE mg/dL
Hgb urine dipstick: NEGATIVE
Ketones, ur: 20 mg/dL — AB
Leukocytes,Ua: NEGATIVE
Nitrite: NEGATIVE
Protein, ur: 30 mg/dL — AB
Specific Gravity, Urine: >1.046 — ABNORMAL HIGH (ref 1.005–1.030)
pH: 5 (ref 5.0–8.0)

## 2019-10-10 MED ORDER — ALUM & MAG HYDROXIDE-SIMETH 200-200-20 MG/5ML PO SUSP
30.0000 mL | Freq: Once | ORAL | Status: AC
  Administered 2019-10-10: 30 mL via ORAL
  Filled 2019-10-10: qty 30

## 2019-10-10 MED ORDER — HALOPERIDOL LACTATE 5 MG/ML IJ SOLN
2.0000 mg | Freq: Once | INTRAMUSCULAR | Status: AC
  Administered 2019-10-10: 01:00:00 2 mg via INTRAVENOUS
  Filled 2019-10-10: qty 1

## 2019-10-10 MED ORDER — PANTOPRAZOLE SODIUM 20 MG PO TBEC: 20.0000 mg | DELAYED_RELEASE_TABLET | Freq: Every day | ORAL | 0 refills | Status: DC

## 2019-10-10 MED ORDER — DICYCLOMINE HCL 20 MG PO TABS: 20.0000 mg | ORAL_TABLET | Freq: Two times a day (BID) | ORAL | 0 refills | Status: DC | PRN

## 2019-10-10 MED ORDER — DICYCLOMINE HCL 10 MG/ML IM SOLN
10.0000 mg | Freq: Once | INTRAMUSCULAR | Status: AC
  Administered 2019-10-10: 10 mg via INTRAMUSCULAR
  Filled 2019-10-10: qty 2

## 2019-10-10 MED ORDER — PANTOPRAZOLE SODIUM 40 MG IV SOLR: 40.0000 mg | Freq: Once | INTRAVENOUS | Status: DC

## 2019-10-10 MED ORDER — LIDOCAINE VISCOUS HCL 2 % MT SOLN
15.0000 mL | Freq: Once | OROMUCOSAL | Status: AC
  Administered 2019-10-10: 01:00:00 15 mL via ORAL
  Filled 2019-10-10: qty 15

## 2019-10-10 MED ORDER — PROMETHAZINE HCL 25 MG PO TABS: 25.0000 mg | ORAL_TABLET | Freq: Four times a day (QID) | ORAL | 0 refills | Status: DC | PRN

## 2019-10-10 MED ORDER — SODIUM CHLORIDE 0.9 % IV BOLUS
1000.0000 mL | Freq: Once | INTRAVENOUS | Status: AC
  Administered 2019-10-10: 1000 mL via INTRAVENOUS

## 2019-10-10 NOTE — Discharge Instructions (Signed)
Avoid fried foods, fatty foods, greasy foods, and milk products until symptoms resolve. Drink plenty of clear liquids. Take Protonix daily. We recommend the use of Phenergan as prescribed for nausea/vomiting. You may use Bentyl for abdominal cramping in addition to your normally prescribed Vicodin. You would benefit from daily use of a probiotic.  This can be purchased over-the-counter at your local pharmacy.  Follow-up with your primary care doctor to ensure resolution of symptoms.

## 2019-10-10 NOTE — ED Notes (Signed)
Discharge instructions reviewed with pt. Pt verbalized understanding.   

## 2019-10-10 NOTE — ED Notes (Signed)
Pt given oral fluids per PA.

## 2019-10-10 NOTE — ED Notes (Signed)
Pt unable to void at this time,PA notified. Pt bladder scanned. Scan showed 354 cc. Pt refusing in/out cath at this time.

## 2019-10-12 ENCOUNTER — Other Ambulatory Visit: Payer: Self-pay

## 2019-10-12 ENCOUNTER — Ambulatory Visit: Payer: Medicare HMO | Admitting: Physical Therapy

## 2019-10-12 ENCOUNTER — Telehealth: Payer: Self-pay

## 2019-10-12 ENCOUNTER — Ambulatory Visit: Payer: Medicare HMO | Admitting: Endocrinology

## 2019-10-12 DIAGNOSIS — E109 Type 1 diabetes mellitus without complications: Secondary | ICD-10-CM

## 2019-10-12 MED ORDER — OZEMPIC (1 MG/DOSE) 2 MG/1.5ML ~~LOC~~ SOPN: 1.0000 mg | PEN_INJECTOR | SUBCUTANEOUS | 11 refills | Status: DC

## 2019-10-12 NOTE — Telephone Encounter (Signed)
Outpatient Medication Detail   Disp Refills Start End   Semaglutide, 1 MG/DOSE, (OZEMPIC, 1 MG/DOSE,) 2 MG/1.5ML SOPN 2 pen 11 10/12/2019    Sig - Route: Inject 1 mg into the skin once a week. - Subcutaneous   Sent to pharmacy as: Semaglutide, 1 MG/DOSE, (OZEMPIC, 1 MG/DOSE,) 2 MG/1.5ML Solution Pen-injector   E-Prescribing Status: Receipt confirmed by pharmacy (10/12/2019 2:20 PM EST)

## 2019-10-12 NOTE — Telephone Encounter (Signed)
MEDICATION: Semaglutide, 1 MG/DOSE, (OZEMPIC, 1 MG/DOSE,) 2 MG/1.5ML SOPN  PHARMACY:  [place] ([place]), [place] - [address]  IS THIS A 90 DAY SUPPLY :   IS PATIENT OUT OF MEDICATION:   IF NOT; HOW MUCH IS LEFT:   LAST APPOINTMENT DATE: @10 /19/2020  NEXT APPOINTMENT DATE:@2 /07/2020  DO WE HAVE YOUR PERMISSION TO LEAVE A DETAILED MESSAGE:  OTHER COMMENTS:    **Let patient know to contact pharmacy at the end of the day to make sure medication is ready. **  ** Please notify patient to allow 48-72 hours to process**  **Encourage patient to contact the pharmacy for refills or they can request refills through [facility]**

## 2019-10-19 ENCOUNTER — Encounter: Payer: Medicare HMO | Admitting: Physical Therapy

## 2019-10-19 IMAGING — DX CHEST - 2 VIEW
2 series · 2 of 2 positions shown · non-contrast
Comparison: None.

CLINICAL DATA: Chest pain

EXAM:
CHEST - 2 VIEW

[chest pa]
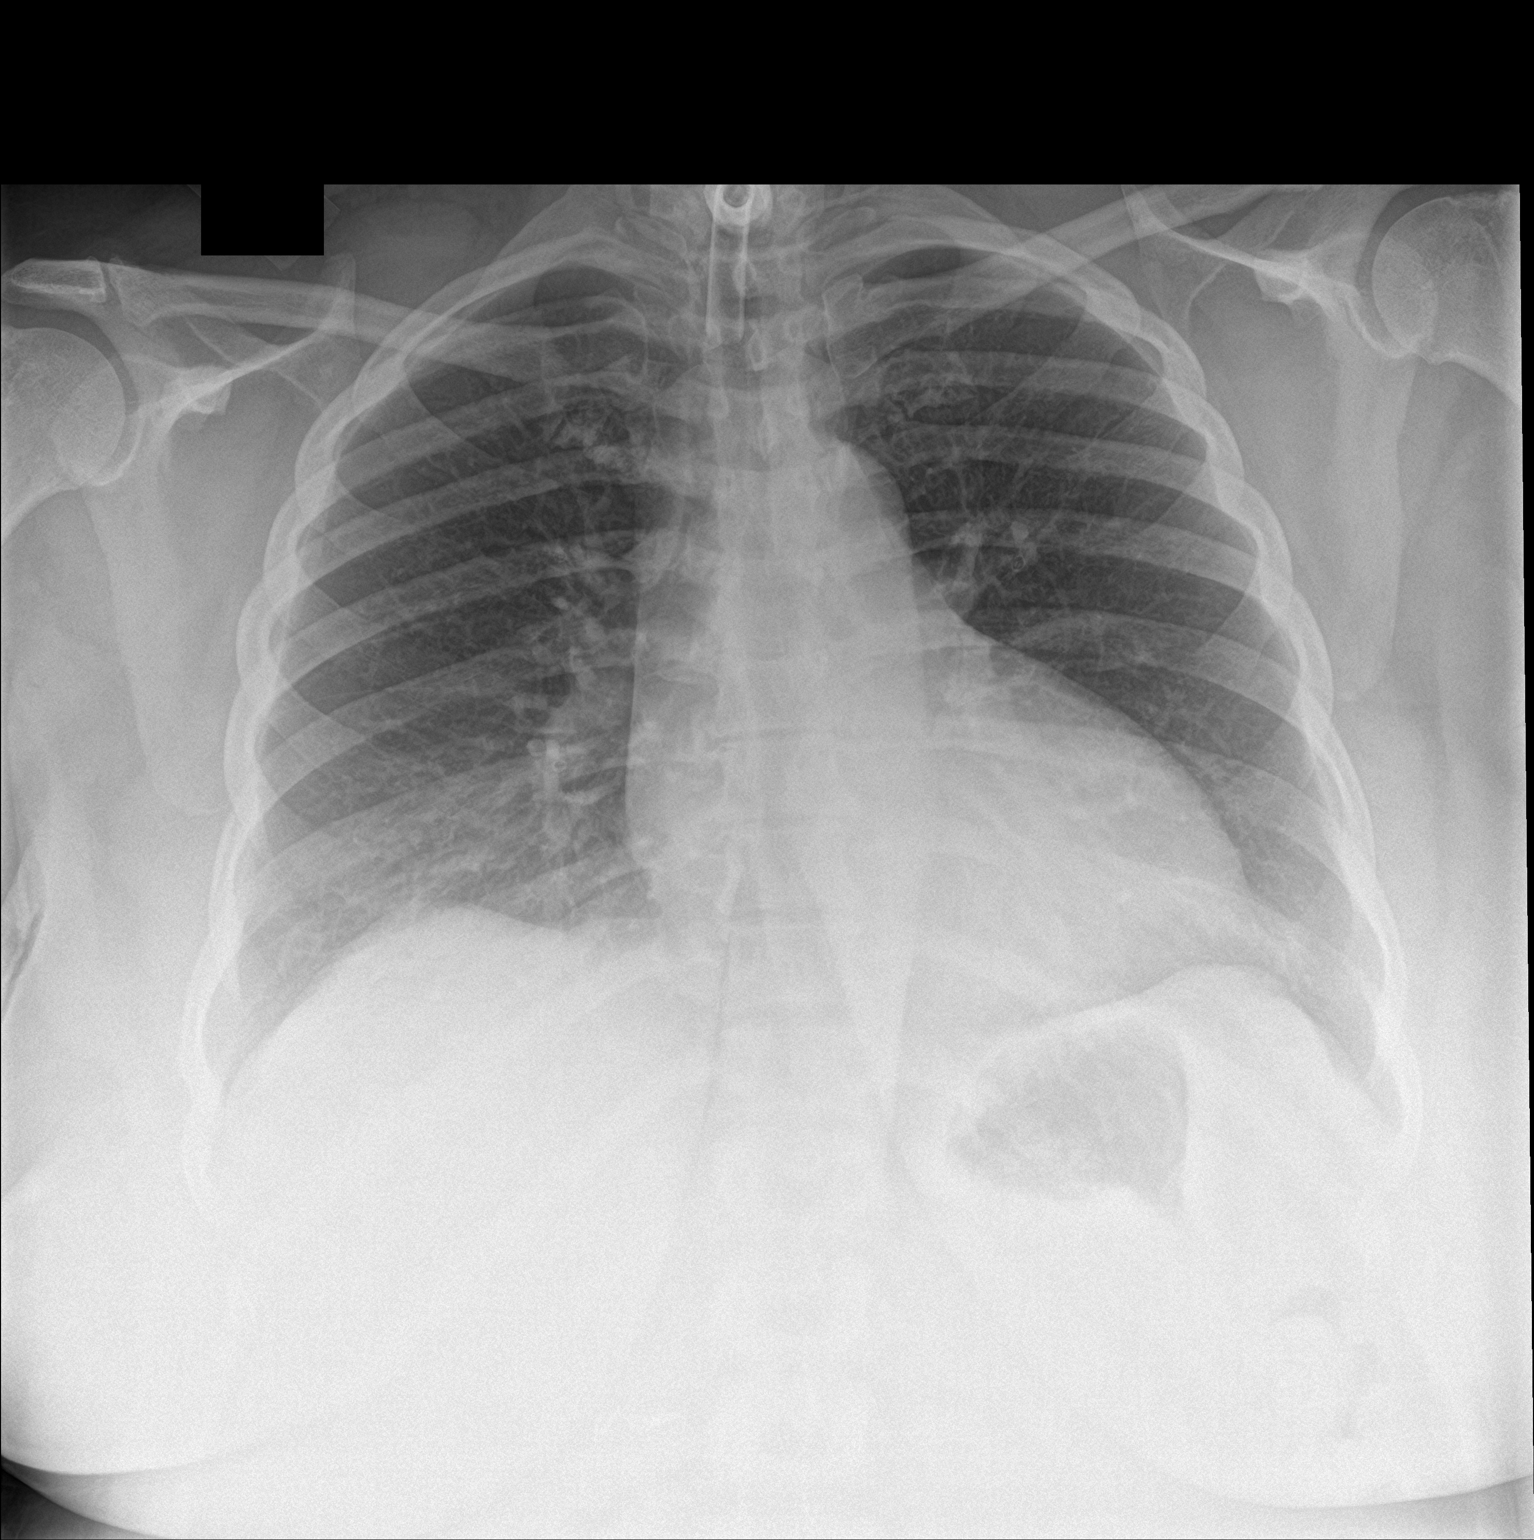

[chest lat]
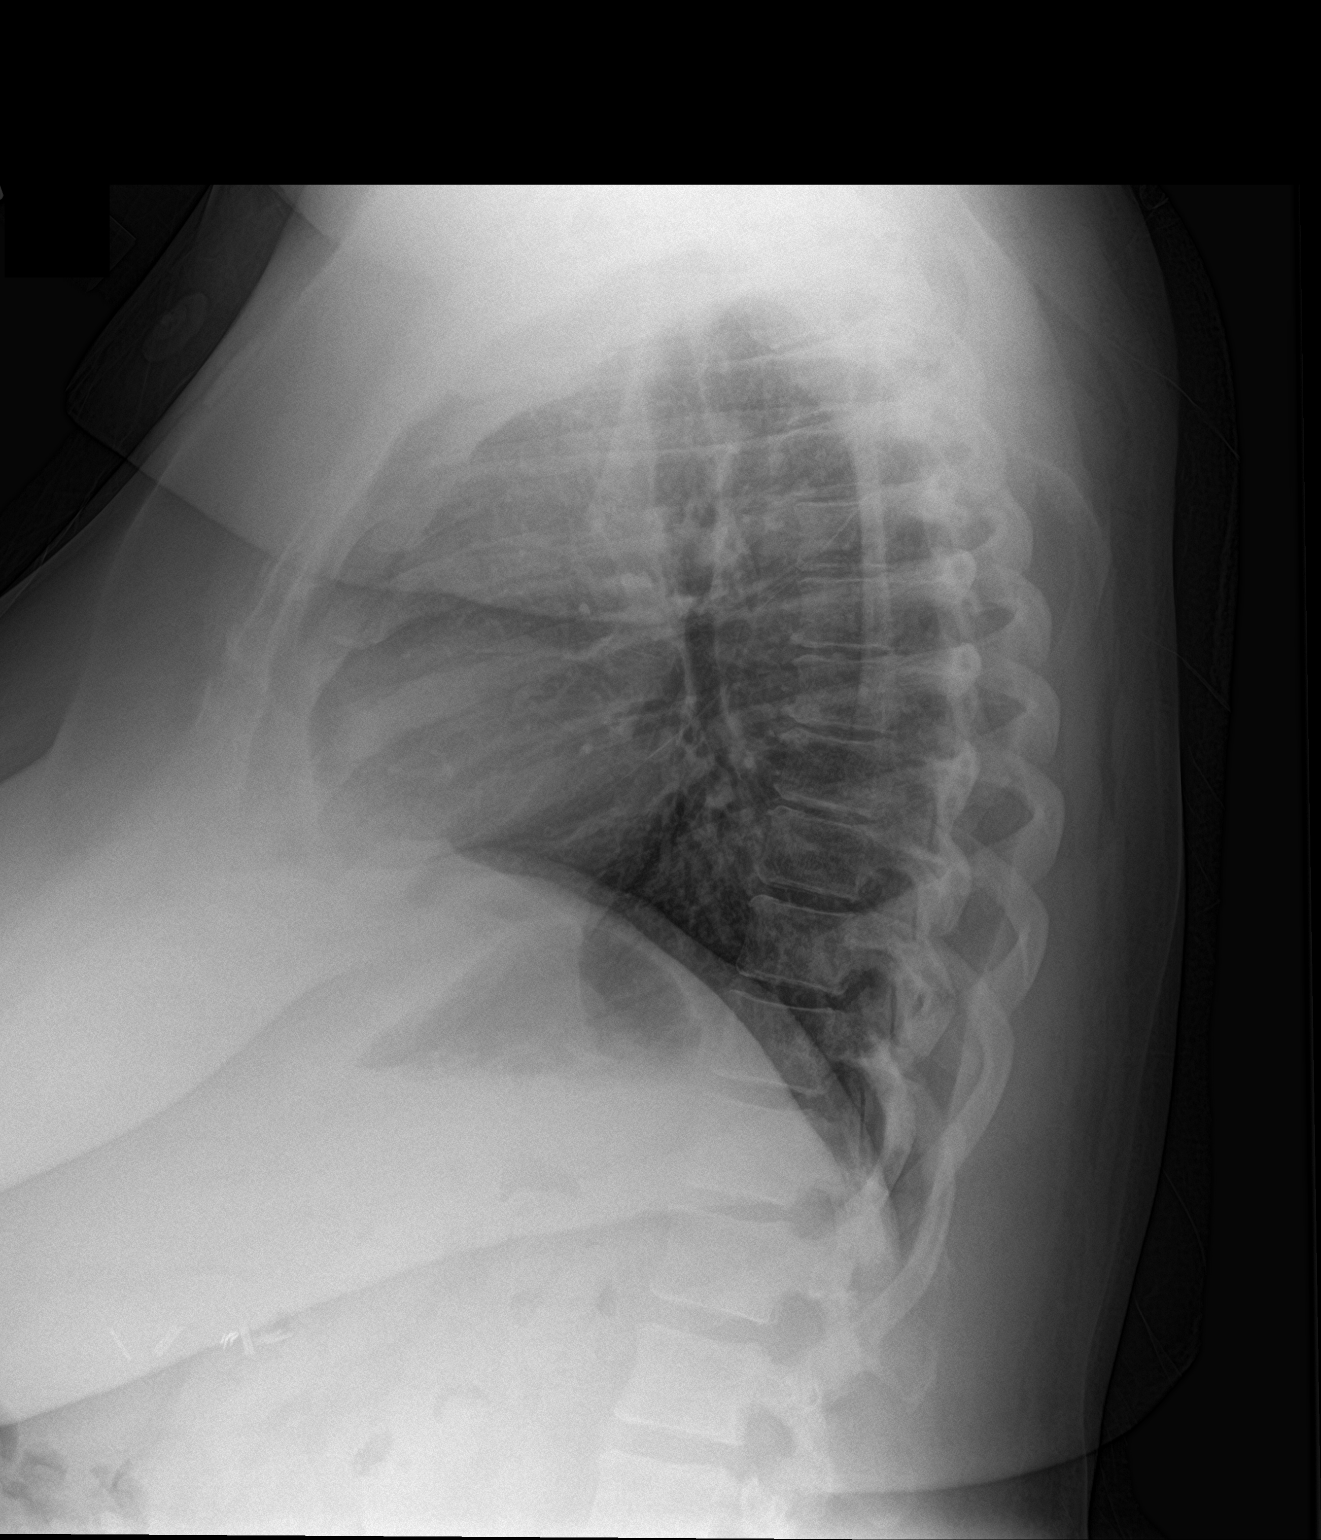

[2 of 2 positions shown; findings below may reference images not displayed]

FINDINGS: Cardiomegaly. Tracheostomy. Both lungs are clear. The visualized
skeletal structures are unremarkable.
IMPRESSION: Cardiomegaly without acute abnormality of the lungs.  Tracheostomy.

## 2019-10-23 IMAGING — DX PORTABLE CHEST - 1 VIEW
1 series · 1 of 1 positions shown · non-contrast
Comparison: Radiograph 03/21/2019, CT 03/22/2019

CLINICAL DATA: Follow-up. Patient presents with right-sided chest
pain.

EXAM:
PORTABLE CHEST 1 VIEW

[chest ap]
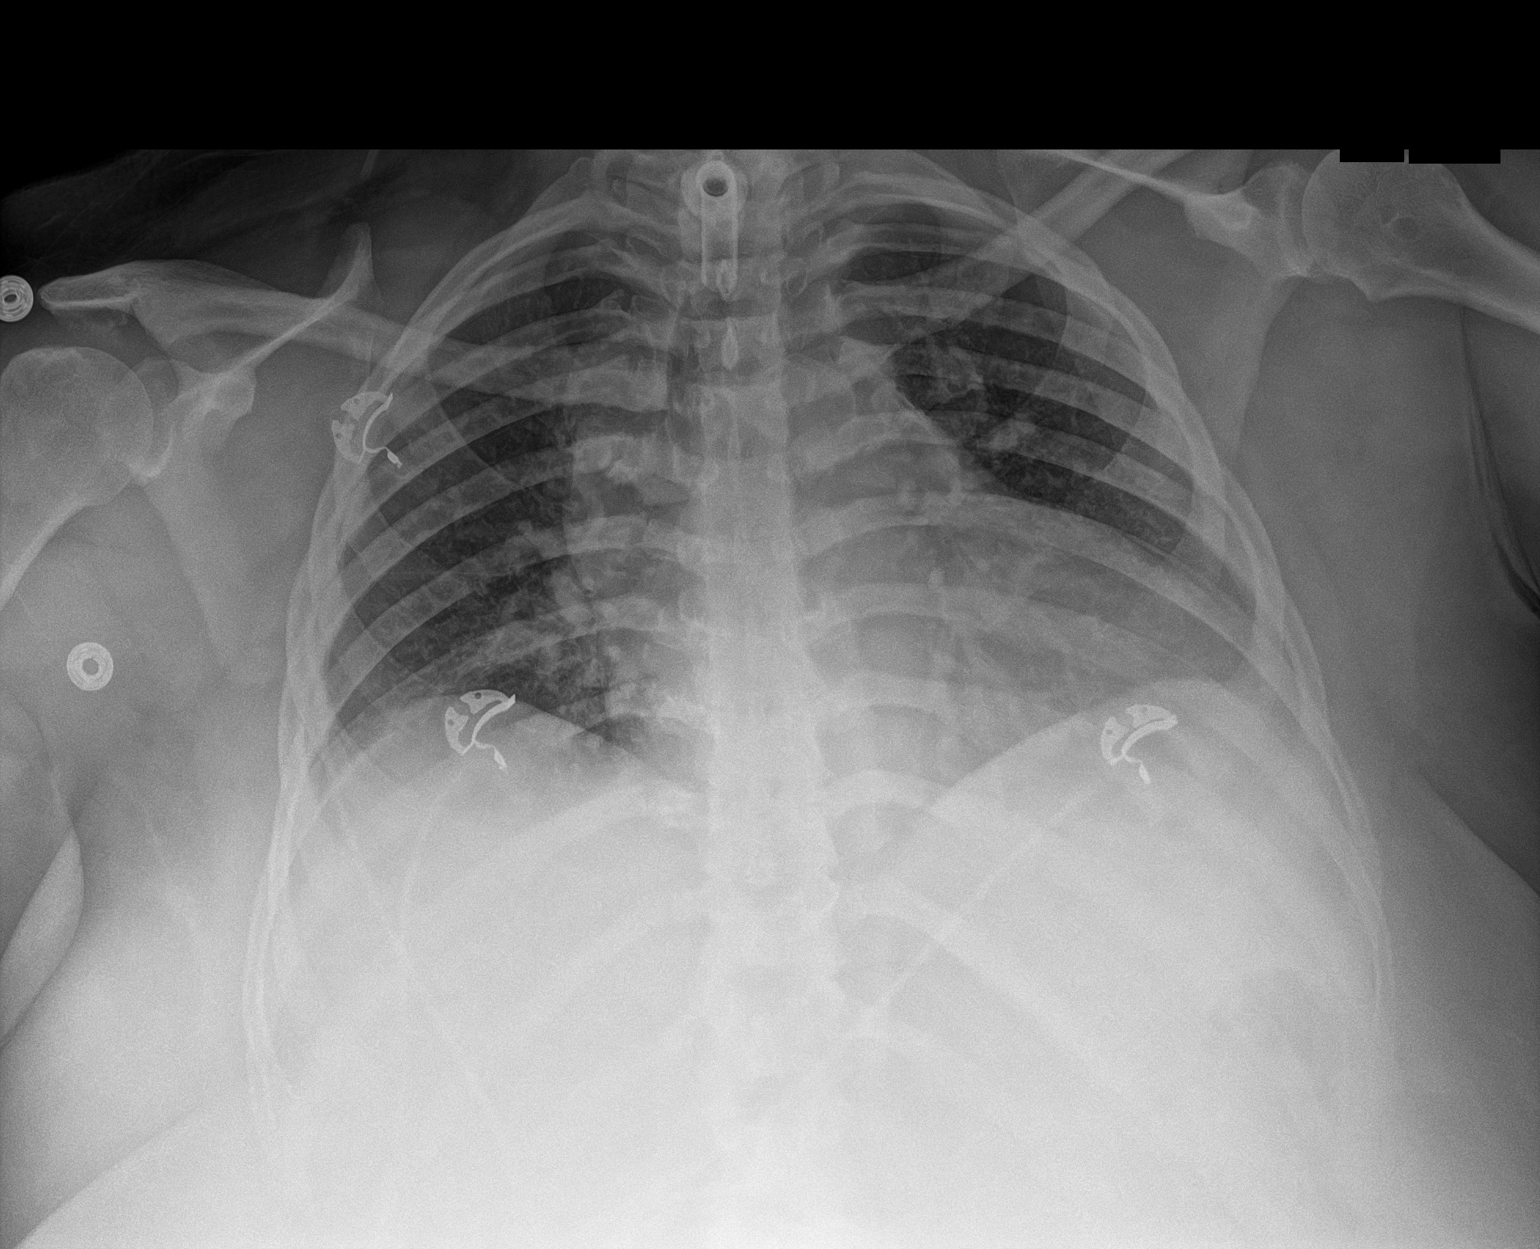

[1 of 1 positions shown; findings below may reference images not displayed]

FINDINGS: Tracheostomy tube tip at the thoracic inlet. Lower lung volumes from
prior exam. Unchanged cardiomegaly and mediastinal contours allowing
for lower lung volumes. No acute airspace disease, large pleural
effusion or pneumothorax. No acute osseous abnormalities.
IMPRESSION: Lower lung volumes from prior exam, no other interval change.
Tracheostomy tube tip remains at the thoracic inlet.

## 2019-10-26 ENCOUNTER — Encounter: Payer: Self-pay | Admitting: Family Medicine

## 2019-10-26 ENCOUNTER — Ambulatory Visit: Payer: Medicare Other | Admitting: Physical Therapy

## 2019-10-27 ENCOUNTER — Other Ambulatory Visit: Payer: Self-pay | Admitting: Family Medicine

## 2019-10-27 MED ORDER — BENZONATATE 100 MG PO CAPS: 100.0000 mg | ORAL_CAPSULE | Freq: Two times a day (BID) | ORAL | 0 refills | Status: DC | PRN

## 2019-10-31 ENCOUNTER — Other Ambulatory Visit: Payer: Self-pay | Admitting: Family Medicine

## 2019-10-31 DIAGNOSIS — J9610 Chronic respiratory failure, unspecified whether with hypoxia or hypercapnia: Secondary | ICD-10-CM | POA: Diagnosis not present

## 2019-10-31 DIAGNOSIS — Z930 Tracheostomy status: Secondary | ICD-10-CM | POA: Diagnosis not present

## 2019-10-31 DIAGNOSIS — J449 Chronic obstructive pulmonary disease, unspecified: Secondary | ICD-10-CM | POA: Diagnosis not present

## 2019-10-31 DIAGNOSIS — G4733 Obstructive sleep apnea (adult) (pediatric): Secondary | ICD-10-CM | POA: Diagnosis not present

## 2019-10-31 DIAGNOSIS — J45909 Unspecified asthma, uncomplicated: Secondary | ICD-10-CM | POA: Diagnosis not present

## 2019-11-01 ENCOUNTER — Other Ambulatory Visit: Payer: Self-pay

## 2019-11-01 ENCOUNTER — Other Ambulatory Visit: Payer: Self-pay | Admitting: Family Medicine

## 2019-11-01 MED ORDER — HYDROCOD POLST-CPM POLST ER 10-8 MG/5ML PO SUER: 5.0000 mL | Freq: Two times a day (BID) | ORAL | 0 refills | Status: DC | PRN

## 2019-11-01 MED ORDER — HYDROCODONE-ACETAMINOPHEN 5-325 MG PO TABS: 1.0000 | ORAL_TABLET | Freq: Every day | ORAL | 0 refills | Status: DC | PRN

## 2019-11-01 NOTE — Telephone Encounter (Signed)
Pls advise.  

## 2019-11-02 ENCOUNTER — Other Ambulatory Visit: Payer: Self-pay

## 2019-11-02 MED ORDER — HYDROCODONE-ACETAMINOPHEN 5-325 MG PO TABS: 1.0000 | ORAL_TABLET | Freq: Every day | ORAL | 0 refills | Status: DC | PRN

## 2019-11-02 NOTE — Telephone Encounter (Signed)
Please advise. Thanks.  

## 2019-11-03 ENCOUNTER — Other Ambulatory Visit: Payer: Self-pay

## 2019-11-03 ENCOUNTER — Encounter: Payer: Self-pay | Admitting: Endocrinology

## 2019-11-03 ENCOUNTER — Ambulatory Visit (INDEPENDENT_AMBULATORY_CARE_PROVIDER_SITE_OTHER): Payer: Medicare HMO | Admitting: Endocrinology

## 2019-11-03 DIAGNOSIS — E109 Type 1 diabetes mellitus without complications: Secondary | ICD-10-CM | POA: Diagnosis not present

## 2019-11-03 NOTE — Progress Notes (Signed)
Subjective:    Patient ID: [name], female    DOB: 04-22-1971, 49 y.o.   MRN: [id number]  HPI telehealth visit today via doxy video visit.  Alternatives to telehealth are presented to this patient, and the patient agrees to the telehealth visit. Pt is advised of the cost of the visit, and agrees to this, also.   Patient is at home, and I am at the office.   Persons attending the telehealth visit: the patient and I Pt returns for f/u of diabetes mellitus: DM type: 1 Dx'ed: 6060 Complications: CRI and PN Therapy: insulin since 2012, and Ozempic GDM: never DKA: once, in 2020 Severe hypoglycemia: never.  Pancreatitis: never.   Other: she declines weight loss surgery; edema precludes pioglitizone rx; she did not tolerate metformin (diarrhea); she is not a candidate for multiple daily injections, due to missing insulin doses.    Interval history: Pt says she seldom misses the insulin. Pt states cbg varies from 125-190.  It is in general higher as the day goes on, but not necessarily so.  Past Medical History:  Diagnosis Date  . Anxiety   . Arthritis   . Asthma   . Chronic back pain   . Chronic chest pain    resolved, no problems since 2019 per patient 07/27/19  . COPD (chronic obstructive pulmonary disease) ([place])   . Depression   . DM (diabetes mellitus) ([place])    INSULIN DEPENDENT - TYPE 1  . GERD (gastroesophageal reflux disease)   . Headache(784.0)   . Hyperlipidemia    no med, diet controlled  . Hypertension   . Hypokalemia   . Respiratory disease 05/2017  . Sleep apnea    does not use CPAP due to trach  . Tracheostomy in place [facility]) 02/2017    Past Surgical History:  Procedure Laterality Date  . ABDOMINAL HYSTERECTOMY  2009  . APPENDECTOMY    . CESAREAN SECTION     x 3  . CHOLECYSTECTOMY N/A 03/02/2014   Procedure: LAPAROSCOPIC CHOLECYSTECTOMY;  Surgeon: Joyice Faster. Cornett, MD;  Location: [place];  Service: General;  Laterality: N/A;  . HERNIA  REPAIR    . PANENDOSCOPY N/A 03/04/2017   Procedure: PANENDOSCOPY WITH POSSIBLE FOREIGN BODY REMOVAL;  Surgeon: Jodi Marble, MD;  Location: [place];  Service: ENT;  Laterality: N/A;  . ROTATOR CUFF REPAIR Left   . ROTATOR CUFF REPAIR Right 07/27/2019  . SHOULDER ARTHROSCOPY WITH SUBACROMIAL DECOMPRESSION, ROTATOR CUFF REPAIR AND BICEP TENDON REPAIR Right 07/28/2019   Procedure: RIGHT SHOULDER ARTHROSCOP, MINI OPEN ROTATOR CUFF TEAR REPAIR,  BICEPS TENODESIS, DISTAL CLAVICLE EXCISION;  Surgeon: Meredith Pel, MD;  Location: [place];  Service: Orthopedics;  Laterality: Right;  . TRACHEOSTOMY  02/2017  . VESICOVAGINAL FISTULA CLOSURE W/ TAH  2009    Social History   Socioeconomic History  . Marital status: Married    Spouse name: Not on file  . Number of children: Not on file  . Years of education: Not on file  . Highest education level: Not on file  Occupational History  . Not on file  Tobacco Use  . Smoking status: Former Smoker    Packs/day: 0.25    Years: 22.00    Pack years: 5.50    Types: Cigarettes    Quit date: 01/20/2017    Years since quitting: 2.7  . Smokeless tobacco: Never Used  Substance and Sexual Activity  . Alcohol use: Yes    Alcohol/week: 0.0 standard drinks  Comment: socially 1-2 glasses wine  . Drug use: Yes    Types: Marijuana    Comment: Last Use Jul 13, 2019  . Sexual activity: Yes    Partners: Male    Birth control/protection: None  Other Topics Concern  . Not on file  Social History Narrative  . Not on file   Social Determinants of Health   Financial Resource Strain:   . Difficulty of Paying Living Expenses: Not on file  Food Insecurity:   . Worried About Charity fundraiser in the Last Year: Not on file  . Ran Out of Food in the Last Year: Not on file  Transportation Needs:   . Lack of Transportation (Medical): Not on file  . Lack of Transportation (Non-Medical): Not on file  Physical Activity:   . Days of Exercise per Week: Not on  file  . Minutes of Exercise per Session: Not on file  Stress:   . Feeling of Stress : Not on file  Social Connections:   . Frequency of Communication with Friends and Family: Not on file  . Frequency of Social Gatherings with Friends and Family: Not on file  . Attends Religious Services: Not on file  . Active Member of Clubs or Organizations: Not on file  . Attends Archivist Meetings: Not on file  . Marital Status: Not on file  Intimate Partner Violence:   . Fear of Current or Ex-Partner: Not on file  . Emotionally Abused: Not on file  . Physically Abused: Not on file  . Sexually Abused: Not on file    Current Outpatient Medications on File Prior to Visit  Medication Sig Dispense Refill  . acetaminophen (TYLENOL) 325 MG tablet Take 1-2 tablets (325-650 mg total) by mouth every 6 (six) hours as needed for mild pain (pain score 1-3 or temp > 100.5). 60 tablet 0  . albuterol (PROAIR HFA) 108 (90 Base) MCG/ACT inhaler Inhale 2 puffs into the lungs every 6 (six) hours as needed for wheezing or shortness of breath. 18 g 6  . albuterol (PROVENTIL) (2.5 MG/3ML) 0.083% nebulizer solution Take 3 mLs (2.5 mg total) by nebulization every 6 (six) hours as needed for wheezing or shortness of breath. 75 mL 3  . amLODipine (NORVASC) 10 MG tablet Take 1 tablet (10 mg total) by mouth daily. 90 tablet 1  . aspirin EC 81 MG tablet Take 1 tablet (81 mg total) by mouth daily. 14 tablet 0  . atorvastatin (LIPITOR) 20 MG tablet Take 1 tablet (20 mg total) by mouth daily. 90 tablet 1  . benzonatate (TESSALON) 100 MG capsule Take 1 capsule (100 mg total) by mouth 2 (two) times daily as needed for cough. 20 capsule 0  . Blood Glucose Monitoring Suppl (ONETOUCH VERIO) w/Device KIT 1 each by Does not apply route daily. Use to monitor glucose levels daily; E11.9 1 kit 0  . budesonide-formoterol (SYMBICORT) 160-4.5 MCG/ACT inhaler Inhale 2 puffs into the lungs 2 (two) times daily. 3 Inhaler 6  . buPROPion  (WELLBUTRIN SR) 150 MG 12 hr tablet Take 1 tablet (150 mg total) by mouth 2 (two) times daily. 60 tablet 6  . carvedilol (COREG) 25 MG tablet Take 1 tablet (25 mg total) by mouth 2 (two) times daily with a meal. 180 tablet 1  . chlorpheniramine-HYDROcodone (TUSSIONEX PENNKINETIC ER) 10-8 MG/5ML SUER Take 5 mLs by mouth every 12 (twelve) hours as needed for cough. 140 mL 0  . dicyclomine (BENTYL) 20 MG tablet Take  1 tablet (20 mg total) by mouth every 12 (twelve) hours as needed (for abdominal pain/cramping). 20 tablet 0  . furosemide (LASIX) 20 MG tablet Take 1 tablet (20 mg total) by mouth daily. 30 tablet 2  . gabapentin (NEURONTIN) 300 MG capsule Take 2 capsules (600 mg total) by mouth 2 (two) times daily. 360 capsule 1  . glucose blood (ONETOUCH VERIO) test strip Use to monitor glucose levels daily; E11.9 100 each 2  . HYDROcodone-acetaminophen (NORCO/VICODIN) 5-325 MG tablet Take 1 tablet by mouth daily as needed for moderate pain. 20 tablet 0  . hydrOXYzine (ATARAX/VISTARIL) 25 MG tablet Take 1 tablet (25 mg total) by mouth 3 (three) times daily as needed for anxiety. 60 tablet 1  . Insulin Degludec (TRESIBA FLEXTOUCH) 200 UNIT/ML SOPN Inject 140 Units into the skin daily. 12 pen 11  . Insulin Pen Needle (FIFTY50 PEN NEEDLES) 31G X 8 MM MISC Inject 1 Units as directed daily.    Marland Kitchen lisinopril (ZESTRIL) 10 MG tablet Take 1 tablet (10 mg total) by mouth daily. 90 tablet 1  . Melatonin 3 MG TABS Take 3 mg by mouth at bedtime as needed (for sleep).     . montelukast (SINGULAIR) 10 MG tablet Take 1 tablet (10 mg total) by mouth at bedtime. 90 tablet 1  . naproxen (NAPROSYN) 500 MG tablet Take 1 tablet (500 mg total) by mouth 2 (two) times daily with a meal. 60 tablet 0  . ondansetron (ZOFRAN) 4 MG tablet Take 1 tablet (4 mg total) by mouth every 8 (eight) hours as needed for nausea or vomiting. 20 tablet 0  . OneTouch Delica Lancets 99M MISC 1 each by Does not apply route daily. Use to monitor glucose  levels daily; E11.9 100 each 2  . oxyCODONE (OXY IR/ROXICODONE) 5 MG immediate release tablet Take 1 tablet (5 mg total) by mouth every 12 (twelve) hours as needed for moderate pain (pain score 4-6). 35 tablet 0  . pantoprazole (PROTONIX) 20 MG tablet Take 1 tablet (20 mg total) by mouth daily. 30 tablet 0  . promethazine (PHENERGAN) 25 MG tablet Take 1 tablet (25 mg total) by mouth every 6 (six) hours as needed for nausea or vomiting. 12 tablet 0  . Semaglutide, 1 MG/DOSE, (OZEMPIC, 1 MG/DOSE,) 2 MG/1.5ML SOPN Inject 1 mg into the skin once a week. 2 pen 11  . sertraline (ZOLOFT) 100 MG tablet Take 1 tablet (100 mg total) by mouth at bedtime. 90 tablet 1  . SUMAtriptan (IMITREX) 100 MG tablet At the onset of a migraine; may repeat in 2 hours. Max daily dose 222m 20 tablet 1  . tiZANidine (ZANAFLEX) 4 MG tablet Take 1 tablet (4 mg total) by mouth every 8 (eight) hours as needed for muscle spasms. 90 tablet 3  . traMADol (ULTRAM) 50 MG tablet Take 1 tablet (50 mg total) by mouth 2 (two) times daily. 30 tablet 0  . [DISCONTINUED] fluticasone-salmeterol (ADVAIR HFA) 230-21 MCG/ACT inhaler Inhale 2 puffs into the lungs 2 (two) times daily. 1 Inhaler 3   No current facility-administered medications on file prior to visit.    Allergies  Allergen Reactions  . Reglan [Metoclopramide] Other (See Comments)    Panic attack    Family History  Problem Relation Age of Onset  . Heart attack Mother   . Stroke Mother   . Diabetes Mother   . Hypertension Mother   . Arthritis Mother   . Stroke Father   . Hypertension Sister   .  Diabetes Sister   . Seizures Brother   . Diabetes Brother     LMP  (LMP Unknown)    Review of Systems She denies hypoglycemia.  Nausea is now mild.  She has lost weight.      Objective:   Physical Exam   Lab Results  Component Value Date   CREATININE 1.69 (H) 10/09/2019   BUN 21 (H) 10/09/2019   NA 140 10/09/2019   K 3.4 (L) 10/09/2019   CL 104 10/09/2019    CO2 20 (L) 10/09/2019       Assessment & Plan:  Type 1 DM, with PN: apparently well-controlled CRI: This limits rx options   Patient Instructions  Please continue the same medications for diabetes. check your blood sugar twice a day.  vary the time of day when you check, between before the 3 meals, and at bedtime.  also check if you have symptoms of your blood sugar being too high or too low.  please keep a record of the readings and bring it to your next appointment here (or you can bring the meter itself).  You can write it on any piece of paper.  please call us sooner if your blood sugar goes below 70, or if you have a lot of readings over 200. Please come back for a follow-up appointment in 2 months.

## 2019-11-03 NOTE — Patient Instructions (Addendum)
Please continue the same medications for diabetes. check your blood sugar twice a day.  vary the time of day when you check, between before the 3 meals, and at bedtime.  also check if you have symptoms of your blood sugar being too high or too low.  please keep a record of the readings and bring it to your next appointment here (or you can bring the meter itself).  You can write it on any piece of paper.  please call us sooner if your blood sugar goes below 70, or if you have a lot of readings over 200. Please come back for a follow-up appointment in 2 months.

## 2019-11-09 ENCOUNTER — Other Ambulatory Visit: Payer: Self-pay

## 2019-11-09 ENCOUNTER — Ambulatory Visit (INDEPENDENT_AMBULATORY_CARE_PROVIDER_SITE_OTHER): Payer: Medicare HMO | Admitting: Orthopedic Surgery

## 2019-11-09 DIAGNOSIS — M545 Low back pain, unspecified: Secondary | ICD-10-CM

## 2019-11-09 DIAGNOSIS — M5410 Radiculopathy, site unspecified: Secondary | ICD-10-CM

## 2019-11-09 DIAGNOSIS — G8929 Other chronic pain: Secondary | ICD-10-CM

## 2019-11-09 DIAGNOSIS — M75121 Complete rotator cuff tear or rupture of right shoulder, not specified as traumatic: Secondary | ICD-10-CM

## 2019-11-10 ENCOUNTER — Ambulatory Visit: Payer: Medicare Other | Admitting: Physical Therapy

## 2019-11-11 ENCOUNTER — Encounter: Payer: Self-pay | Admitting: Orthopedic Surgery

## 2019-11-11 NOTE — Progress Notes (Signed)
Post-Op Visit Note   Patient: [name]           Date of Birth: [birth year]           MRN: [id number] Visit Date: 11/09/2019 PCP: [name], MD   Assessment & Plan:  Chief Complaint:  Chief Complaint  Patient presents with  . Right Shoulder - Pain   Visit Diagnoses:  1. Nontraumatic complete tear of right rotator cuff   2. Chronic low back pain, unspecified back pain laterality, unspecified whether sciatica present   3. Radicular pain of right lower extremity     Plan: 49 year old female who presents s/p right shoulder distal clavicle excision with rotator cuff repair and biceps tenodesis.  She is about 14 weeks out from surgery.  She notes that she continues to improve but does complain of continued pain.  Her incisions have healed well.  She has a history of being in pain management.  She has good strength of the rotator cuff on exam today.  She states that she is pleased with her progress and she notes that it will likely take her longer to fully recover from surgery as she had a similar procedure on her left shoulder that took her approximately 6 months to completely get over.  Her main complaint today is acute worsening of low back pain with right leg radicular symptoms over the last 2 weeks.  She notes right leg radicular pain that travels from her back to her buttocks to the lateral right thigh and finally ends at the posterior right calf.  She has a positive contralateral and ipsilateral straight leg raise on exam as well as subjective numbness and tingling and subjectively weak leg.  She has good strength of all muscles of the bilateral lower extremities on exam today.  Her last MRI was in 2018 and she had ESI's that provided some relief.  Plan to refer patient to Dr. Laurence Spates for lumbar spine ESI's based on the MRI of the lumbar spine from 12/17/2016.  Patient agreed with this plan.  She will follow up in 6 weeks for clinical recheck.  Follow-Up  Instructions: No follow-ups on file.   Orders:  Orders Placed This Encounter  Procedures  . Ambulatory referral to Physical Medicine Rehab   No orders of the defined types were placed in this encounter.   Imaging: No results found.  PMFS History: Patient Active Problem List   Diagnosis Date Noted  . Rotator cuff tear 07/28/2019  . Diabetes ([place]) 04/12/2019  . Acute asthma exacerbation 03/22/2019  . Chest pain of uncertain etiology 123456  . Lactic acidosis 03/22/2019  . Chronic right shoulder pain   . Community acquired pneumonia 10/25/2018  . Asthma, chronic, unspecified asthma severity, with acute exacerbation 10/25/2018  . Acute respiratory failure with hypoxia and hypercapnia ([place]) 10/23/2018  . Acute pain of right shoulder due to trauma 10/05/2018  . Chronic bilateral low back pain without sciatica 10/05/2018  . Dysphonia 10/05/2018  . Severe asthma with exacerbation 10/05/2018  . Rotator cuff arthropathy, right 06/15/2018  . AKI (acute kidney injury) ([place]) 03/06/2018  . Carpal tunnel syndrome 01/18/2018  . Normocytic anemia 05/06/2017  . Tracheostomy status ([place]) 04/28/2017  . Tracheostomy dependent ([place]) 03/26/2017  . Subglottic stenosis 03/06/2017  . Tracheal stenosis 03/04/2017  . Acute blood loss anemia   . Generalized anxiety disorder   . Hypoalbuminemia due to protein-calorie malnutrition ([place])   . Morbid obesity ([place])   . Chronic pain syndrome   .  Gastritis and gastroduodenitis 02/22/2016  . Depression 02/22/2016  . Migraine 01/30/2015  . Bulging lumbar disc 09/05/2013  . Essential hypertension 09/05/2013  . Allergic rhinitis, seasonal 08/11/2012  . Chronic cough 08/11/2012  . Sleep apnea, obstructive 12/04/2011   Past Medical History:  Diagnosis Date  . Anxiety   . Arthritis   . Asthma   . Chronic back pain   . Chronic chest pain    resolved, no problems since 2019 per patient 07/27/19  . COPD (chronic obstructive pulmonary disease) ([place])    . Depression   . DM (diabetes mellitus) ([place])    INSULIN DEPENDENT - TYPE 1  . GERD (gastroesophageal reflux disease)   . Headache(784.0)   . Hyperlipidemia    no med, diet controlled  . Hypertension   . Hypokalemia   . Respiratory disease 05/2017  . Sleep apnea    does not use CPAP due to trach  . Tracheostomy in place [facility]) 02/2017    Family History  Problem Relation Age of Onset  . Heart attack Mother   . Stroke Mother   . Diabetes Mother   . Hypertension Mother   . Arthritis Mother   . Stroke Father   . Hypertension Sister   . Diabetes Sister   . Seizures Brother   . Diabetes Brother     Past Surgical History:  Procedure Laterality Date  . ABDOMINAL HYSTERECTOMY  2009  . APPENDECTOMY    . CESAREAN SECTION     x 3  . CHOLECYSTECTOMY N/A 03/02/2014   Procedure: LAPAROSCOPIC CHOLECYSTECTOMY;  Surgeon: Joyice Faster. Cornett, MD;  Location: [place];  Service: General;  Laterality: N/A;  . HERNIA REPAIR    . PANENDOSCOPY N/A 03/04/2017   Procedure: PANENDOSCOPY WITH POSSIBLE FOREIGN BODY REMOVAL;  Surgeon: Jodi Marble, MD;  Location: [place];  Service: ENT;  Laterality: N/A;  . ROTATOR CUFF REPAIR Left   . ROTATOR CUFF REPAIR Right 07/27/2019  . SHOULDER ARTHROSCOPY WITH SUBACROMIAL DECOMPRESSION, ROTATOR CUFF REPAIR AND BICEP TENDON REPAIR Right 07/28/2019   Procedure: RIGHT SHOULDER ARTHROSCOP, MINI OPEN ROTATOR CUFF TEAR REPAIR,  BICEPS TENODESIS, DISTAL CLAVICLE EXCISION;  Surgeon: Meredith Pel, MD;  Location: [place];  Service: Orthopedics;  Laterality: Right;  . TRACHEOSTOMY  02/2017  . VESICOVAGINAL FISTULA CLOSURE W/ TAH  2009   Social History   Occupational History  . Not on file  Tobacco Use  . Smoking status: Former Smoker    Packs/day: 0.25    Years: 22.00    Pack years: 5.50    Types: Cigarettes    Quit date: 01/20/2017    Years since quitting: 2.8  . Smokeless tobacco: Never Used  Substance and Sexual Activity  . Alcohol use: Yes    Alcohol/week:  0.0 standard drinks    Comment: socially 1-2 glasses wine  . Drug use: Yes    Types: Marijuana    Comment: Last Use Jul 13, 2019  . Sexual activity: Yes    Partners: Male    Birth control/protection: None

## 2019-11-28 ENCOUNTER — Other Ambulatory Visit: Payer: Self-pay

## 2019-11-28 ENCOUNTER — Encounter: Payer: Self-pay | Admitting: Physical Therapy

## 2019-11-28 ENCOUNTER — Ambulatory Visit: Payer: Medicare Other | Attending: Orthopedic Surgery | Admitting: Physical Therapy

## 2019-11-28 DIAGNOSIS — R600 Localized edema: Secondary | ICD-10-CM | POA: Diagnosis not present

## 2019-11-28 DIAGNOSIS — M25511 Pain in right shoulder: Secondary | ICD-10-CM | POA: Insufficient documentation

## 2019-11-28 DIAGNOSIS — G8929 Other chronic pain: Secondary | ICD-10-CM | POA: Insufficient documentation

## 2019-11-28 DIAGNOSIS — M6281 Muscle weakness (generalized): Secondary | ICD-10-CM | POA: Diagnosis not present

## 2019-11-28 DIAGNOSIS — M25611 Stiffness of right shoulder, not elsewhere classified: Secondary | ICD-10-CM | POA: Diagnosis not present

## 2019-11-28 NOTE — Patient Instructions (Signed)
Access Code: [email or web address]  URL: [name]  Date: 11/28/2019  Prepared by: Hilda Blades   Exercises Standing shoulder flexion wall slides - 10 reps - 2 sets - 5 seconds hold - 3x daily - 7x weekly Sidelying Shoulder Abduction Palm Forward - 10 reps - 2 sets - 3x daily - 7x weekly Banded Row - 15 reps - 2 sets - 1x daily - 7x weekly Shoulder External Rotation with Anchored Resistance - 15 reps - 2 sets - 1x daily - 7x weekly Shoulder Internal Rotation with Resistance - 15 reps - 2 sets - 1x daily - 7x weekly

## 2019-11-28 NOTE — Therapy (Signed)
[place], [place], [address] Phone: [phone]   Fax:  [phone]  Physical Therapy Treatment  Progress Note Reporting Period 10/05/2019 to 11/28/2019  See note below for Objective Data and Assessment of Progress/Goals.    Patient Details  Name: [name] MRN: [id number] Date of Birth: Jan 22, 1971 Referring Provider (PT): Marlou Sa Tonna Corner, MD   Encounter Date: 11/28/2019  PT End of Session - 11/28/19 1634    Visit Number  9    Number of Visits  17    Date for PT Re-Evaluation  01/23/20    Authorization Type  [employer] MCR    PT Start Time  1629    PT Stop Time  1700    PT Time Calculation (min)  31 min    Activity Tolerance  Patient tolerated treatment well;Patient limited by pain    Behavior During Therapy  [facility] for tasks assessed/performed       Past Medical History:  Diagnosis Date  . Anxiety   . Arthritis   . Asthma   . Chronic back pain   . Chronic chest pain    resolved, no problems since 2019 per patient 07/27/19  . COPD (chronic obstructive pulmonary disease) ([place])   . Depression   . DM (diabetes mellitus) ([place])    INSULIN DEPENDENT - TYPE 1  . GERD (gastroesophageal reflux disease)   . Headache(784.0)   . Hyperlipidemia    no med, diet controlled  . Hypertension   . Hypokalemia   . Respiratory disease 05/2017  . Sleep apnea    does not use CPAP due to trach  . Tracheostomy in place [facility]) 02/2017    Past Surgical History:  Procedure Laterality Date  . ABDOMINAL HYSTERECTOMY  2009  . APPENDECTOMY    . CESAREAN SECTION     x 3  . CHOLECYSTECTOMY N/A 03/02/2014   Procedure: LAPAROSCOPIC CHOLECYSTECTOMY;  Surgeon: Joyice Faster. Cornett, MD;  Location: [place];  Service: General;  Laterality: N/A;  . HERNIA REPAIR    . PANENDOSCOPY N/A 03/04/2017   Procedure: PANENDOSCOPY WITH POSSIBLE FOREIGN BODY REMOVAL;  Surgeon: Jodi Marble, MD;  Location: [place];  Service: ENT;   Laterality: N/A;  . ROTATOR CUFF REPAIR Left   . ROTATOR CUFF REPAIR Right 07/27/2019  . SHOULDER ARTHROSCOPY WITH SUBACROMIAL DECOMPRESSION, ROTATOR CUFF REPAIR AND BICEP TENDON REPAIR Right 07/28/2019   Procedure: RIGHT SHOULDER ARTHROSCOP, MINI OPEN ROTATOR CUFF TEAR REPAIR,  BICEPS TENODESIS, DISTAL CLAVICLE EXCISION;  Surgeon: Meredith Pel, MD;  Location: [place];  Service: Orthopedics;  Laterality: Right;  . TRACHEOSTOMY  02/2017  . VESICOVAGINAL FISTULA CLOSURE W/ TAH  2009    There were no vitals filed for this visit.  Subjective Assessment - 11/28/19 1632    Subjective  Patient reports she has been doing well. She continues to have sharp pain when she uses the shoulder. She can't sleep on that shoulder.    Limitations  House hold activities;Lifting;Walking    Patient Stated Goals  Lift arm overhead so she can return to prior level of function    Currently in Pain?  Yes    Pain Score  6     Pain Location  Shoulder    Pain Orientation  Right    Pain Descriptors / Indicators  Aching;Tightness;Sharp    Pain Type  Surgical pain;Chronic pain    Pain Onset  More than a month ago    Pain Frequency  Intermittent  Aggravating Factors   Moving the right amr/shoulder    Pain Relieving Factors  Medication, rest         [facility] PT Assessment - 11/28/19 0001      Assessment   Medical Diagnosis  Right rotator cuff repair, distal clavicle excission, bicep tenodesis    Referring Provider (PT)  Meredith Pel, MD    Onset Date/Surgical Date  07/28/19    Hand Dominance  Right      Precautions   Precautions  None      Balance Screen   Has the patient fallen in the past 6 months  No      Prior Function   Level of Independence  Independent with basic ADLs      Observation/Other Assessments   Focus on Therapeutic Outcomes (FOTO)   56% limitation      AROM   Right Shoulder Extension  20 Degrees    Right Shoulder Flexion  120 Degrees    Right Shoulder Internal Rotation  --    Unable   Right Shoulder External Rotation  45 Degrees      PROM   Right Shoulder Flexion  140 Degrees    Right Shoulder ABduction  120 Degrees                   [facility] Adult PT Treatment/Exercise - 11/28/19 0001      Exercises   Exercises  Shoulder      Shoulder Exercises: Sidelying   ABduction  10 reps      Shoulder Exercises: Standing   External Rotation  15 reps   2 sets   Theraband Level (Shoulder External Rotation)  Level 1 (Yellow)    Internal Rotation  15 reps   2 sets   Theraband Level (Shoulder Internal Rotation)  Level 1 (Yellow)    Flexion  10 reps   2 sets   Flexion Limitations  wall slide    Row  15 reps   2 sets   Theraband Level (Shoulder Row)  Level 1 (Yellow)      Shoulder Exercises: ROM/Strengthening   UBE (Upper Arm Bike)  L1 x 4 min (fwd/bwd)             PT Education - 11/28/19 1634    Education Details  HEP    Person(s) Educated  Patient    Methods  Explanation;Demonstration;Verbal cues;Handout    Comprehension  Verbalized understanding;Returned demonstration;Verbal cues required;Need further instruction       PT Short Term Goals - 10/05/19 1709      PT SHORT TERM GOAL #1   Title  She will be indpendent with initial HEP to make progress in PT and understand post-op precautions.    Time  2    Period  Weeks    Status  Achieved    Target Date  09/05/19      PT SHORT TERM GOAL #2   Title  Patient will report pain level < or = 6/10 more than 75% of the time to allow for improved sleep and function.    Time  3    Period  Weeks    Status  Achieved    Target Date  09/12/19      PT SHORT TERM GOAL #3   Title  Patient will exhibit >90 deg passive shoulder elevation without increased pain level to improve motion.    Time  2    Status  Achieved    Target Date  09/05/19  PT Long Term Goals - 11/28/19 1749      PT LONG TERM GOAL #1   Title  She will exhibit AROM WFL in all directions in order to allow for reaching  overhead cabinets.    Time  8    Period  Weeks    Status  On-going    Target Date  01/23/20      PT LONG TERM GOAL #2   Title  Patient will exhibit shoulder elevation PROM >120 deg without increased pain level.    Time  6    Period  Weeks    Status  Achieved      PT LONG TERM GOAL #3   Title  Patient will report < or = 3-4/10 pain level with exercises to allow for improve activity tolerance and progress exercise.    Time  8    Period  Weeks    Status  On-going    Target Date  01/23/20      PT LONG TERM GOAL #4   Title  FOTO score will be < or = 50% to indicate improved functional level.    Time  8    Period  Weeks    Status  On-going    Target Date  01/23/20            Plan - 11/28/19 1745    Clinical Impression Statement  Patient has not be seen in therapy since 10/05/2019 due to medical issues. She is over 4 months out from right rotator cuff repair and continued to exhibit limitation in active motion and strength with increased pain. Her passive motion is good and her main deficit seems to be strength related. She would benefit form continued skilled PT to progress her strength to allow for better reaching overhead and ability to use right arm for dressing/bathing tasks.    Examination-Activity Limitations  Bathing;Dressing;Bed Mobility;Hygiene/Grooming;Lift;Carry;Reach Overhead;Sleep    Examination-Participation Restrictions  Cleaning;Laundry;Yard Work;Community Activity;Shop;Driving;Meal Prep;Volunteer    PT Frequency  1x / week    PT Duration  8 weeks    PT Treatment/Interventions  ADLs/Self Care Home Management;Cryotherapy;Electrical Stimulation;Moist Heat;Ultrasound;Therapeutic activities;Therapeutic exercise;Neuromuscular re-education;Patient/family education;Manual techniques;Scar mobilization;Taping;Dry needling;Passive range of motion;Vasopneumatic Device;Joint Manipulations;Spinal Manipulations    PT Next Visit Plan  Assess HEP, manual to improve PROM as tolerated,  progress AAROM>AROM exercises as tolerated (sidelying abduction, supine flexion, pulleys)    PT Home Exercise Plan  [id number]: wall slide, row, banded ER and IR, side lying abduction    Consulted and Agree with Plan of Care  Patient       Patient will benefit from skilled therapeutic intervention in order to improve the following deficits and impairments:  Increased muscle spasms, Decreased knowledge of precautions, Decreased range of motion, Increased edema, Decreased activity tolerance, Decreased strength, Impaired UE functional use, Postural dysfunction, Pain  Visit Diagnosis: Chronic right shoulder pain  Stiffness of right shoulder, not elsewhere classified  Muscle weakness (generalized)  Localized edema     Problem List Patient Active Problem List   Diagnosis Date Noted  . Rotator cuff tear 07/28/2019  . Diabetes ([place]) 04/12/2019  . Acute asthma exacerbation 03/22/2019  . Chest pain of uncertain etiology 123456  . Lactic acidosis 03/22/2019  . Chronic right shoulder pain   . Community acquired pneumonia 10/25/2018  . Asthma, chronic, unspecified asthma severity, with acute exacerbation 10/25/2018  . Acute respiratory failure with hypoxia and hypercapnia ([employer]) 10/23/2018  . Acute pain of right shoulder due to trauma 10/05/2018  . Chronic  bilateral low back pain without sciatica 10/05/2018  . Dysphonia 10/05/2018  . Severe asthma with exacerbation 10/05/2018  . Rotator cuff arthropathy, right 06/15/2018  . AKI (acute kidney injury) ([place]) 03/06/2018  . Carpal tunnel syndrome 01/18/2018  . Normocytic anemia 05/06/2017  . Tracheostomy status ([place]) 04/28/2017  . Tracheostomy dependent ([place]) 03/26/2017  . Subglottic stenosis 03/06/2017  . Tracheal stenosis 03/04/2017  . Acute blood loss anemia   . Generalized anxiety disorder   . Hypoalbuminemia due to protein-calorie malnutrition ([place])   . Morbid obesity ([place])   . Chronic pain syndrome   . Gastritis and  gastroduodenitis 02/22/2016  . Depression 02/22/2016  . Migraine 01/30/2015  . Bulging lumbar disc 09/05/2013  . Essential hypertension 09/05/2013  . Allergic rhinitis, seasonal 08/11/2012  . Chronic cough 08/11/2012  . Sleep apnea, obstructive 12/04/2011    Hilda Blades, PT, DPT, LAT, ATC 11/28/19  5:52 PM Phone: [phone] Fax: [place] [facility] [address] [place], [place], [address] Phone: [phone]   Fax:  [phone]  Name: Monte Hindman Bradley MRN: [id number] Date of Birth: 02-Oct-1970

## 2019-11-29 ENCOUNTER — Other Ambulatory Visit: Payer: Self-pay | Admitting: Nurse Practitioner

## 2019-11-29 ENCOUNTER — Other Ambulatory Visit: Payer: Self-pay | Admitting: Family Medicine

## 2019-11-29 ENCOUNTER — Other Ambulatory Visit: Payer: Self-pay | Admitting: Surgical

## 2019-11-29 ENCOUNTER — Other Ambulatory Visit: Payer: Self-pay

## 2019-11-29 MED ORDER — BENZONATATE 100 MG PO CAPS: 100.0000 mg | ORAL_CAPSULE | Freq: Two times a day (BID) | ORAL | 0 refills | Status: DC | PRN

## 2019-11-29 MED ORDER — TRAMADOL HCL 50 MG PO TABS: 50.0000 mg | ORAL_TABLET | Freq: Every day | ORAL | 0 refills | Status: DC | PRN

## 2019-11-29 NOTE — Telephone Encounter (Signed)
It looks like you did this already?

## 2019-11-29 NOTE — Telephone Encounter (Signed)
Prescribed Tramadol instead of Norco.  This will be the last pain medication RX.  Patient does have a history of being in pain management so if she needs to continue taking pain medication, she needs to return to pain management.

## 2019-12-01 ENCOUNTER — Ambulatory Visit: Payer: Self-pay

## 2019-12-01 ENCOUNTER — Other Ambulatory Visit: Payer: Self-pay

## 2019-12-01 ENCOUNTER — Ambulatory Visit (INDEPENDENT_AMBULATORY_CARE_PROVIDER_SITE_OTHER): Payer: Medicare Other | Admitting: Physical Medicine and Rehabilitation

## 2019-12-01 ENCOUNTER — Encounter: Payer: Self-pay | Admitting: Physical Medicine and Rehabilitation

## 2019-12-01 VITALS — BP 131/88 | HR 87 | Temp 98.0°F

## 2019-12-01 DIAGNOSIS — M48061 Spinal stenosis, lumbar region without neurogenic claudication: Secondary | ICD-10-CM

## 2019-12-01 MED ORDER — METHYLPREDNISOLONE ACETATE 80 MG/ML IJ SUSP
40.0000 mg | Freq: Once | INTRAMUSCULAR | Status: AC
  Administered 2019-12-01: 40 mg

## 2019-12-01 NOTE — Progress Notes (Signed)
 .  Numeric Pain Rating Scale and Functional Assessment Average Pain 8   In the last MONTH (on 0-10 scale) has pain interfered with the following?  1. General activity like being  able to carry out your everyday physical activities such as walking, climbing stairs, carrying groceries, or moving a chair?  Rating(8)   +Driver, -BT, -Dye Allergies.  

## 2019-12-05 ENCOUNTER — Ambulatory Visit: Payer: Medicare Other | Admitting: Physical Therapy

## 2019-12-05 NOTE — Progress Notes (Signed)
[name] - 49 y.o. female MRN [id number]  Date of birth: [birth year]  Office Visit Note: Visit Date: 12/01/2019 PCP: [name], MD Referred by: [name], MD  Subjective: Chief Complaint  Patient presents with  . Lower Back - Pain   HPI:  [name] is a 49 y.o. female who comes in today For planned right L4-5 interlaminar epidural steroid injection at the request of Dr. Anderson Malta, M.D..  The patient has failed conservative care including home exercise, medications, time and activity modification.  This injection will be diagnostic and hopefully therapeutic.  Please see requesting physician notes for further details and justification. I saw her in 2019 and completed epidural injection with good relief at the time.  She is having somewhat more axial back pain then referral pain at this point.  If she does not get relief with this I would look at facet joint block at L4-5.  Her case is complicated by obesity and type 1 diabetes.  She does have significant facet arthropathy at L4-5.  Would consider updating MRI as well depending on how she is doing.  ROS Otherwise per HPI.  Assessment & Plan: Visit Diagnoses:  1. Spinal stenosis of lumbar region without neurogenic claudication     Plan: No additional findings.   Meds & Orders:  Meds ordered this encounter  Medications  . methylPREDNISolone acetate (DEPO-MEDROL) injection 40 mg    Orders Placed This Encounter  Procedures  . XR C-ARM NO REPORT  . Epidural Steroid injection    Follow-up: Return for L4-5 Facet Block.   Procedures: No procedures performed  Lumbar Epidural Steroid Injection - Interlaminar Approach with Fluoroscopic Guidance  Patient: [name]      Date of Birth: [birth year] MRN: [id number] PCP: [name], MD      Visit Date: 12/01/2019   Universal Protocol:     Consent Given By: the patient  Position: PRONE  Additional  Comments: Vital signs were monitored before and after the procedure. Patient was prepped and draped in the usual sterile fashion. The correct patient, procedure, and site was verified.   Injection Procedure Details:  Procedure Site One Meds Administered:  Meds ordered this encounter  Medications  . methylPREDNISolone acetate (DEPO-MEDROL) injection 40 mg     Laterality: Right  Location/Site:  L4-L5  Needle size: 20 G  Needle type: Tuohy  Needle Placement: Paramedian epidural  Findings:   -Comments: Excellent flow of contrast into the epidural space.  Procedure Details: Using a paramedian approach from the side mentioned above, the region overlying the inferior lamina was localized under fluoroscopic visualization and the soft tissues overlying this structure were infiltrated with 4 ml. of 1% Lidocaine without Epinephrine. The Tuohy needle was inserted into the epidural space using a paramedian approach.   The epidural space was localized using loss of resistance along with lateral and bi-planar fluoroscopic views.  After negative aspirate for air, blood, and CSF, a 2 ml. volume of Isovue-250 was injected into the epidural space and the flow of contrast was observed. Radiographs were obtained for documentation purposes.    The injectate was administered into the level noted above.   Additional Comments:  The patient tolerated the procedure well Dressing: 2 x 2 sterile gauze and Band-Aid    Post-procedure details: Patient was observed during the procedure. Post-procedure instructions were reviewed.  Patient left the clinic in stable condition.    Clinical History: MRI LUMBAR SPINE WITHOUT CONTRAST  TECHNIQUE: Multiplanar, multisequence  MR imaging of the lumbar spine was performed. No intravenous contrast was administered.  COMPARISON:  Radiography 07/08/2016.  MRI 09/30/2013.  FINDINGS: Segmentation:  5 lumbar type vertebral bodies.  Alignment:  Minimal  curvature convex to the right.  Vertebrae:  No fracture or primary bone lesion.  Conus medullaris: Extends to the L1 level and appears normal.  Paraspinal and other soft tissues: Negative  Disc levels:  No abnormality at L3-4 or above. The discs are normal. The canal and foramina are widely patent. The distal cord and conus are normal.  L4-5: Disc degeneration with shallow broad-based disc herniation with slight upward turning. Facet arthropathy with fluid-filled joints. Abundant epidural fat. Mild stenosis of the lateral recesses without definite neural compression. Anterolisthesis would likely occur with standing or flexion.  L5-S1: No disc pathology.  Minimal facet hypertrophy.  No stenosis.  Compared to the study of 2015, the appearance of the disc is unchanged. The facet arthropathy is worsened slightly.  IMPRESSION: L4-5: Shallow broad-based disc herniation with slight upward turning, unchanged since 2015. Bilateral facet arthropathy with fluid-filled joints, worsened since 2015. Abundant epidural fat. Mild constriction of the thecal sac without definite neural compression. This appearance could worsen with standing or flexion based on the morphology of the facet arthropathy.   Electronically Signed   By: Nelson Chimes M.D.   On: 12/17/2016 14:14     Objective:  VS:  HT:    WT:   BMI:     BP:131/88  HR:87bpm  TEMP:98 F (36.7 C)(Temporal)  RESP:96 % Physical Exam  Ortho Exam Imaging: No results found.

## 2019-12-05 NOTE — Procedures (Signed)
Lumbar Epidural Steroid Injection - Interlaminar Approach with Fluoroscopic Guidance  Patient: [name]      Date of Birth: 1970/11/07 MRN: [id number] PCP: [name], MD      Visit Date: 12/01/2019   Universal Protocol:     Consent Given By: the patient  Position: PRONE  Additional Comments: Vital signs were monitored before and after the procedure. Patient was prepped and draped in the usual sterile fashion. The correct patient, procedure, and site was verified.   Injection Procedure Details:  Procedure Site One Meds Administered:  Meds ordered this encounter  Medications  . methylPREDNISolone acetate (DEPO-MEDROL) injection 40 mg     Laterality: Right  Location/Site:  L4-L5  Needle size: 20 G  Needle type: Tuohy  Needle Placement: Paramedian epidural  Findings:   -Comments: Excellent flow of contrast into the epidural space.  Procedure Details: Using a paramedian approach from the side mentioned above, the region overlying the inferior lamina was localized under fluoroscopic visualization and the soft tissues overlying this structure were infiltrated with 4 ml. of 1% Lidocaine without Epinephrine. The Tuohy needle was inserted into the epidural space using a paramedian approach.   The epidural space was localized using loss of resistance along with lateral and bi-planar fluoroscopic views.  After negative aspirate for air, blood, and CSF, a 2 ml. volume of Isovue-250 was injected into the epidural space and the flow of contrast was observed. Radiographs were obtained for documentation purposes.    The injectate was administered into the level noted above.   Additional Comments:  The patient tolerated the procedure well Dressing: 2 x 2 sterile gauze and Band-Aid    Post-procedure details: Patient was observed during the procedure. Post-procedure instructions were reviewed.  Patient left the clinic in stable condition.

## 2019-12-09 ENCOUNTER — Other Ambulatory Visit: Payer: Self-pay | Admitting: Family Medicine

## 2019-12-12 ENCOUNTER — Ambulatory Visit: Payer: Medicare Other | Admitting: Physical Therapy

## 2019-12-12 ENCOUNTER — Encounter: Payer: Self-pay | Admitting: Physical Therapy

## 2019-12-12 ENCOUNTER — Other Ambulatory Visit: Payer: Self-pay

## 2019-12-12 DIAGNOSIS — M25611 Stiffness of right shoulder, not elsewhere classified: Secondary | ICD-10-CM | POA: Diagnosis not present

## 2019-12-12 DIAGNOSIS — G8929 Other chronic pain: Secondary | ICD-10-CM

## 2019-12-12 DIAGNOSIS — M6281 Muscle weakness (generalized): Secondary | ICD-10-CM

## 2019-12-12 DIAGNOSIS — M25511 Pain in right shoulder: Secondary | ICD-10-CM | POA: Diagnosis not present

## 2019-12-12 DIAGNOSIS — R600 Localized edema: Secondary | ICD-10-CM

## 2019-12-12 NOTE — Therapy (Addendum)
[place], [place], [address] Phone: [phone]   Fax:  [phone]  Physical Therapy Treatment / Discharge  Patient Details  Name: [name] MRN: [id number] Date of Birth: 1971-01-04 Referring Provider (PT): Marlou Sa Tonna Corner, MD   Encounter Date: 12/12/2019    Past Medical History:  Diagnosis Date  . Anxiety   . Arthritis   . Asthma   . Chronic back pain   . Chronic chest pain    resolved, no problems since 2019 per patient 07/27/19  . COPD (chronic obstructive pulmonary disease) ([place])   . Depression   . DM (diabetes mellitus) ([place])    INSULIN DEPENDENT - TYPE 1  . GERD (gastroesophageal reflux disease)   . Headache(784.0)   . Hyperlipidemia    no med, diet controlled  . Hypertension   . Hypokalemia   . Respiratory disease 05/2017  . Sleep apnea    does not use CPAP due to trach  . Tracheostomy in place [facility]) 02/2017    Past Surgical History:  Procedure Laterality Date  . ABDOMINAL HYSTERECTOMY  2009  . APPENDECTOMY    . CESAREAN SECTION     x 3  . CHOLECYSTECTOMY N/A 03/02/2014   Procedure: LAPAROSCOPIC CHOLECYSTECTOMY;  Surgeon: Joyice Faster. Cornett, MD;  Location: [place];  Service: General;  Laterality: N/A;  . HERNIA REPAIR    . PANENDOSCOPY N/A 03/04/2017   Procedure: PANENDOSCOPY WITH POSSIBLE FOREIGN BODY REMOVAL;  Surgeon: Jodi Marble, MD;  Location: [place];  Service: ENT;  Laterality: N/A;  . ROTATOR CUFF REPAIR Left   . ROTATOR CUFF REPAIR Right 07/27/2019  . SHOULDER ARTHROSCOPY WITH SUBACROMIAL DECOMPRESSION, ROTATOR CUFF REPAIR AND BICEP TENDON REPAIR Right 07/28/2019   Procedure: RIGHT SHOULDER ARTHROSCOP, MINI OPEN ROTATOR CUFF TEAR REPAIR,  BICEPS TENODESIS, DISTAL CLAVICLE EXCISION;  Surgeon: Meredith Pel, MD;  Location: [place];  Service: Orthopedics;  Laterality: Right;  . TRACHEOSTOMY  02/2017  . VESICOVAGINAL FISTULA CLOSURE W/ TAH  2009     There were no vitals filed for this visit.                              PT Short Term Goals - 10/05/19 1709      PT SHORT TERM GOAL #1   Title  She will be indpendent with initial HEP to make progress in PT and understand post-op precautions.    Time  2    Period  Weeks    Status  Achieved    Target Date  09/05/19      PT SHORT TERM GOAL #2   Title  Patient will report pain level < or = 6/10 more than 75% of the time to allow for improved sleep and function.    Time  3    Period  Weeks    Status  Achieved    Target Date  09/12/19      PT SHORT TERM GOAL #3   Title  Patient will exhibit >90 deg passive shoulder elevation without increased pain level to improve motion.    Time  2    Status  Achieved    Target Date  09/05/19        PT Long Term Goals - 11/28/19 1749      PT LONG TERM GOAL #1   Title  She will exhibit AROM WFL in all directions in order to allow for  reaching overhead cabinets.    Time  8    Period  Weeks    Status  On-going    Target Date  01/23/20      PT LONG TERM GOAL #2   Title  Patient will exhibit shoulder elevation PROM >120 deg without increased pain level.    Time  6    Period  Weeks    Status  Achieved      PT LONG TERM GOAL #3   Title  Patient will report < or = 3-4/10 pain level with exercises to allow for improve activity tolerance and progress exercise.    Time  8    Period  Weeks    Status  On-going    Target Date  01/23/20      PT LONG TERM GOAL #4   Title  FOTO score will be < or = 50% to indicate improved functional level.    Time  8    Period  Weeks    Status  On-going    Target Date  01/23/20              Patient will benefit from skilled therapeutic intervention in order to improve the following deficits and impairments:  Increased muscle spasms, Decreased knowledge of precautions, Decreased range of motion, Increased edema, Decreased activity tolerance, Decreased strength, Impaired UE  functional use, Postural dysfunction, Pain  Visit Diagnosis: Chronic right shoulder pain  Stiffness of right shoulder, not elsewhere classified  Muscle weakness (generalized)  Localized edema     Problem List Patient Active Problem List   Diagnosis Date Noted  . Rotator cuff tear 07/28/2019  . Diabetes ([place]) 04/12/2019  . Acute asthma exacerbation 03/22/2019  . Chest pain of uncertain etiology 62/22/9798  . Lactic acidosis 03/22/2019  . Chronic right shoulder pain   . Community acquired pneumonia 10/25/2018  . Asthma, chronic, unspecified asthma severity, with acute exacerbation 10/25/2018  . Acute respiratory failure with hypoxia and hypercapnia ([place]) 10/23/2018  . Acute pain of right shoulder due to trauma 10/05/2018  . Chronic bilateral low back pain without sciatica 10/05/2018  . Dysphonia 10/05/2018  . Severe asthma with exacerbation 10/05/2018  . Rotator cuff arthropathy, right 06/15/2018  . AKI (acute kidney injury) ([place]) 03/06/2018  . Carpal tunnel syndrome 01/18/2018  . Normocytic anemia 05/06/2017  . Tracheostomy status ([place]) 04/28/2017  . Tracheostomy dependent ([place]) 03/26/2017  . Subglottic stenosis 03/06/2017  . Tracheal stenosis 03/04/2017  . Acute blood loss anemia   . Generalized anxiety disorder   . Hypoalbuminemia due to protein-calorie malnutrition ([place])   . Morbid obesity ([place])   . Chronic pain syndrome   . Gastritis and gastroduodenitis 02/22/2016  . Depression 02/22/2016  . Migraine 01/30/2015  . Bulging lumbar disc 09/05/2013  . Essential hypertension 09/05/2013  . Allergic rhinitis, seasonal 08/11/2012  . Chronic cough 08/11/2012  . Sleep apnea, obstructive 12/04/2011    PHYSICAL THERAPY DISCHARGE SUMMARY  Visits from Start of Care: 10  Current functional level related to goals / functional outcomes: See above   Remaining deficits: See above   Education / Equipment: HEP  Plan: Patient agrees to discharge.  Patient goals were  not met. Patient is being discharged due to not returning since the last visit.  ?????     Hilda Blades, PT, DPT, LAT, ATC 01/25/20  2:03 PM Phone: [phone] Fax: [facility] [address] [place], [place], [address] Phone: [phone]   Fax:  [phone]  Name: [name] MRN: [id number] Date of Birth: 1970-11-28

## 2019-12-15 ENCOUNTER — Encounter: Payer: Medicare Other | Admitting: Physical Medicine and Rehabilitation

## 2019-12-19 ENCOUNTER — Ambulatory Visit: Payer: Self-pay

## 2019-12-19 ENCOUNTER — Ambulatory Visit (INDEPENDENT_AMBULATORY_CARE_PROVIDER_SITE_OTHER): Payer: Medicare Other | Admitting: Physical Medicine and Rehabilitation

## 2019-12-19 ENCOUNTER — Other Ambulatory Visit: Payer: Self-pay

## 2019-12-19 ENCOUNTER — Encounter: Payer: Self-pay | Admitting: Physical Medicine and Rehabilitation

## 2019-12-19 ENCOUNTER — Ambulatory Visit: Payer: Medicare Other | Admitting: Physical Therapy

## 2019-12-19 VITALS — BP 156/106 | HR 101

## 2019-12-19 DIAGNOSIS — M47816 Spondylosis without myelopathy or radiculopathy, lumbar region: Secondary | ICD-10-CM

## 2019-12-19 DIAGNOSIS — E109 Type 1 diabetes mellitus without complications: Secondary | ICD-10-CM

## 2019-12-19 MED ORDER — OZEMPIC (1 MG/DOSE) 2 MG/1.5ML ~~LOC~~ SOPN: 1.0000 mg | PEN_INJECTOR | SUBCUTANEOUS | 11 refills | Status: DC

## 2019-12-19 MED ORDER — METHYLPREDNISOLONE ACETATE 80 MG/ML IJ SUSP
40.0000 mg | Freq: Once | INTRAMUSCULAR | Status: AC
  Administered 2019-12-19: 40 mg

## 2019-12-19 NOTE — Progress Notes (Signed)
 .  Numeric Pain Rating Scale and Functional Assessment Average Pain 8   In the last MONTH (on 0-10 scale) has pain interfered with the following?  1. General activity like being  able to carry out your everyday physical activities such as walking, climbing stairs, carrying groceries, or moving a chair?  Rating(8)   +Driver, -BT, -Dye Allergies.  

## 2019-12-20 NOTE — Procedures (Signed)
Lumbar Facet Joint Intra-Articular Injection(s) with Fluoroscopic Guidance  Patient: [name]      Date of Birth: 12-18-1970 MRN: [id number] PCP: [name], MD      Visit Date: 12/19/2019   Universal Protocol:    Date/Time: 12/19/2019  Consent Given By: the patient  Position: PRONE   Additional Comments: Vital signs were monitored before and after the procedure. Patient was prepped and draped in the usual sterile fashion. The correct patient, procedure, and site was verified.   Injection Procedure Details:  Procedure Site One Meds Administered:  Meds ordered this encounter  Medications  . methylPREDNISolone acetate (DEPO-MEDROL) injection 40 mg     Laterality: Bilateral  Location/Site:  L4-L5  Needle size: 22 guage  Needle type: Spinal  Needle Placement: Articular  Findings:  -Comments: Excellent flow of contrast producing a partial arthrogram.  Procedure Details: The fluoroscope beam is vertically oriented in AP, and the inferior recess is visualized beneath the lower pole of the inferior apophyseal process, which represents the target point for needle insertion. When direct visualization is difficult the target point is located at the medial projection of the vertebral pedicle. The region overlying each aforementioned target is locally anesthetized with a 1 to 2 ml. volume of 1% Lidocaine without Epinephrine.   The spinal needle was inserted into each of the above mentioned facet joints using biplanar fluoroscopic guidance. A 0.25 to 0.5 ml. volume of Isovue-250 was injected and a partial facet joint arthrogram was obtained. A single spot film was obtained of the resulting arthrogram.    One to 1.25 ml of the steroid/anesthetic solution was then injected into each of the facet joints noted above.   Additional Comments:  The patient tolerated the procedure well Dressing: 2 x 2 sterile gauze and Band-Aid    Post-procedure  details: Patient was observed during the procedure. Post-procedure instructions were reviewed.  Patient left the clinic in stable condition.

## 2019-12-20 NOTE — Progress Notes (Signed)
[name] - 49 y.o. female MRN [id number]  Date of birth: 08-11-1971  Office Visit Note: Visit Date: 12/19/2019 PCP: [name], MD Referred by: [name], MD  Subjective: Chief Complaint  Patient presents with  . Lower Back - Pain   HPI: [name] is a 49 y.o. female who comes in today For planned bilateral L4-5 facet joint block.  Please see our prior notes for further details.  Brief history is the patient's been having chronic low back pain for a couple of years since traumatic accident.  She has seen Dr. Alysia Penna who completed medial branch blocks without any relief.  She has MRI showing facet arthropathy at L4-5 with some disc bulging no focal compression.  She also has abundant epidural lipomatosis.  We completed lumbar epidural injection with no relief.  Pain is essentially axial low back pain.  She rates her pain as 10 out of 10.  If she does not get relief with intra-articular facet joint block then my recommendation would be MRI of the lumbar spine, follow-up with Dr. Anderson Malta for further evaluation of orthopedic complaints as well as potential for surgical referral for her spine.  She may be better suited for chronic pain management if none of those are getting to the source of pain.  ROS Otherwise per HPI.  Assessment & Plan: Visit Diagnoses:  1. Spondylosis without myelopathy or radiculopathy, lumbar region     Plan: No additional findings.   Meds & Orders:  Meds ordered this encounter  Medications  . methylPREDNISolone acetate (DEPO-MEDROL) injection 40 mg    Orders Placed This Encounter  Procedures  . Facet Injection  . XR C-ARM NO REPORT    Follow-up: Return if symptoms worsen or fail to improve.   Procedures: No procedures performed  Lumbar Facet Joint Intra-Articular Injection(s) with Fluoroscopic Guidance  Patient: [name]      Date of Birth: October 31, 1970 MRN:  [id number] PCP: [name], MD      Visit Date: 12/19/2019   Universal Protocol:    Date/Time: 12/19/2019  Consent Given By: the patient  Position: PRONE   Additional Comments: Vital signs were monitored before and after the procedure. Patient was prepped and draped in the usual sterile fashion. The correct patient, procedure, and site was verified.   Injection Procedure Details:  Procedure Site One Meds Administered:  Meds ordered this encounter  Medications  . methylPREDNISolone acetate (DEPO-MEDROL) injection 40 mg     Laterality: Bilateral  Location/Site:  L4-L5  Needle size: 22 guage  Needle type: Spinal  Needle Placement: Articular  Findings:  -Comments: Excellent flow of contrast producing a partial arthrogram.  Procedure Details: The fluoroscope beam is vertically oriented in AP, and the inferior recess is visualized beneath the lower pole of the inferior apophyseal process, which represents the target point for needle insertion. When direct visualization is difficult the target point is located at the medial projection of the vertebral pedicle. The region overlying each aforementioned target is locally anesthetized with a 1 to 2 ml. volume of 1% Lidocaine without Epinephrine.   The spinal needle was inserted into each of the above mentioned facet joints using biplanar fluoroscopic guidance. A 0.25 to 0.5 ml. volume of Isovue-250 was injected and a partial facet joint arthrogram was obtained. A single spot film was obtained of the resulting arthrogram.    One to 1.25 ml of the steroid/anesthetic solution was then injected into each of the facet joints noted  above.   Additional Comments:  The patient tolerated the procedure well Dressing: 2 x 2 sterile gauze and Band-Aid    Post-procedure details: Patient was observed during the procedure. Post-procedure instructions were reviewed.  Patient left the clinic in stable condition.     Clinical  History: MRI LUMBAR SPINE WITHOUT CONTRAST  TECHNIQUE: Multiplanar, multisequence MR imaging of the lumbar spine was performed. No intravenous contrast was administered.  COMPARISON:  Radiography 07/08/2016.  MRI 09/30/2013.  FINDINGS: Segmentation:  5 lumbar type vertebral bodies.  Alignment:  Minimal curvature convex to the right.  Vertebrae:  No fracture or primary bone lesion.  Conus medullaris: Extends to the L1 level and appears normal.  Paraspinal and other soft tissues: Negative  Disc levels:  No abnormality at L3-4 or above. The discs are normal. The canal and foramina are widely patent. The distal cord and conus are normal.  L4-5: Disc degeneration with shallow broad-based disc herniation with slight upward turning. Facet arthropathy with fluid-filled joints. Abundant epidural fat. Mild stenosis of the lateral recesses without definite neural compression. Anterolisthesis would likely occur with standing or flexion.  L5-S1: No disc pathology.  Minimal facet hypertrophy.  No stenosis.  Compared to the study of 2015, the appearance of the disc is unchanged. The facet arthropathy is worsened slightly.  IMPRESSION: L4-5: Shallow broad-based disc herniation with slight upward turning, unchanged since 2015. Bilateral facet arthropathy with fluid-filled joints, worsened since 2015. Abundant epidural fat. Mild constriction of the thecal sac without definite neural compression. This appearance could worsen with standing or flexion based on the morphology of the facet arthropathy.   Electronically Signed   By: Nelson Chimes M.D.   On: 12/17/2016 14:14   She reports that she quit smoking about 2 years ago. Her smoking use included cigarettes. She has a 5.50 pack-year smoking history. She has never used smokeless tobacco.  Recent Labs    03/22/19 1415 05/17/19 1619 07/11/19 1608  HGBA1C 13.7* 10.1* 7.5*    Objective:  VS:  HT:    WT:   BMI:      BP:(!) 156/106  HR:(!) 101bpm  TEMP: ( )  RESP:  Physical Exam  Ortho Exam Imaging: XR C-ARM NO REPORT  Result Date: 12/19/2019 Please see Notes tab for imaging impression.   Past Medical/Family/Surgical/Social History: Medications & Allergies reviewed per EMR, new medications updated. Patient Active Problem List   Diagnosis Date Noted  . Rotator cuff tear 07/28/2019  . Diabetes ([place]) 04/12/2019  . Acute asthma exacerbation 03/22/2019  . Chest pain of uncertain etiology 123456  . Lactic acidosis 03/22/2019  . Chronic right shoulder pain   . Community acquired pneumonia 10/25/2018  . Asthma, chronic, unspecified asthma severity, with acute exacerbation 10/25/2018  . Acute respiratory failure with hypoxia and hypercapnia ([place]) 10/23/2018  . Acute pain of right shoulder due to trauma 10/05/2018  . Chronic bilateral low back pain without sciatica 10/05/2018  . Dysphonia 10/05/2018  . Severe asthma with exacerbation 10/05/2018  . Rotator cuff arthropathy, right 06/15/2018  . AKI (acute kidney injury) ([place]) 03/06/2018  . Carpal tunnel syndrome 01/18/2018  . Normocytic anemia 05/06/2017  . Tracheostomy status ([place]) 04/28/2017  . Tracheostomy dependent ([place]) 03/26/2017  . Subglottic stenosis 03/06/2017  . Tracheal stenosis 03/04/2017  . Acute blood loss anemia   . Generalized anxiety disorder   . Hypoalbuminemia due to protein-calorie malnutrition ([place])   . Morbid obesity ([place])   . Chronic pain syndrome   . Gastritis and gastroduodenitis 02/22/2016  .  Depression 02/22/2016  . Migraine 01/30/2015  . Bulging lumbar disc 09/05/2013  . Essential hypertension 09/05/2013  . Allergic rhinitis, seasonal 08/11/2012  . Chronic cough 08/11/2012  . Sleep apnea, obstructive 12/04/2011   Past Medical History:  Diagnosis Date  . Anxiety   . Arthritis   . Asthma   . Chronic back pain   . Chronic chest pain    resolved, no problems since 2019 per patient 07/27/19  . COPD (chronic  obstructive pulmonary disease) ([place])   . Depression   . DM (diabetes mellitus) ([place])    INSULIN DEPENDENT - TYPE 1  . GERD (gastroesophageal reflux disease)   . Headache(784.0)   . Hyperlipidemia    no med, diet controlled  . Hypertension   . Hypokalemia   . Respiratory disease 05/2017  . Sleep apnea    does not use CPAP due to trach  . Tracheostomy in place [facility]) 02/2017   Family History  Problem Relation Age of Onset  . Heart attack Mother   . Stroke Mother   . Diabetes Mother   . Hypertension Mother   . Arthritis Mother   . Stroke Father   . Hypertension Sister   . Diabetes Sister   . Seizures Brother   . Diabetes Brother    Past Surgical History:  Procedure Laterality Date  . ABDOMINAL HYSTERECTOMY  2009  . APPENDECTOMY    . CESAREAN SECTION     x 3  . CHOLECYSTECTOMY N/A 03/02/2014   Procedure: LAPAROSCOPIC CHOLECYSTECTOMY;  Surgeon: Joyice Faster. Cornett, MD;  Location: [facility];  Service: General;  Laterality: N/A;  . HERNIA REPAIR    . PANENDOSCOPY N/A 03/04/2017   Procedure: PANENDOSCOPY WITH POSSIBLE FOREIGN BODY REMOVAL;  Surgeon: Jodi Marble, MD;  Location: [place];  Service: ENT;  Laterality: N/A;  . ROTATOR CUFF REPAIR Left   . ROTATOR CUFF REPAIR Right 07/27/2019  . SHOULDER ARTHROSCOPY WITH SUBACROMIAL DECOMPRESSION, ROTATOR CUFF REPAIR AND BICEP TENDON REPAIR Right 07/28/2019   Procedure: RIGHT SHOULDER ARTHROSCOP, MINI OPEN ROTATOR CUFF TEAR REPAIR,  BICEPS TENODESIS, DISTAL CLAVICLE EXCISION;  Surgeon: Meredith Pel, MD;  Location: [facility];  Service: Orthopedics;  Laterality: Right;  . TRACHEOSTOMY  02/2017  . VESICOVAGINAL FISTULA CLOSURE W/ TAH  2009   Social History   Occupational History  . Not on file  Tobacco Use  . Smoking status: Former Smoker    Packs/day: 0.25    Years: 22.00    Pack years: 5.50    Types: Cigarettes    Quit date: 01/20/2017    Years since quitting: 2.9  . Smokeless tobacco: Never Used  Substance and Sexual Activity  .  Alcohol use: Yes    Alcohol/week: 0.0 standard drinks    Comment: socially 1-2 glasses wine  . Drug use: Yes    Types: Marijuana    Comment: Last Use Jul 13, 2019  . Sexual activity: Yes    Partners: Male    Birth control/protection: None

## 2019-12-21 ENCOUNTER — Ambulatory Visit: Payer: Medicare Other | Admitting: Orthopedic Surgery

## 2019-12-28 ENCOUNTER — Other Ambulatory Visit: Payer: Self-pay

## 2019-12-28 ENCOUNTER — Ambulatory Visit (INDEPENDENT_AMBULATORY_CARE_PROVIDER_SITE_OTHER): Payer: Medicare Other | Admitting: Orthopedic Surgery

## 2019-12-28 DIAGNOSIS — G8929 Other chronic pain: Secondary | ICD-10-CM | POA: Diagnosis not present

## 2019-12-28 DIAGNOSIS — M545 Low back pain: Secondary | ICD-10-CM

## 2019-12-28 MED ORDER — PREDNISONE 10 MG (21) PO TBPK: ORAL_TABLET | ORAL | 0 refills | Status: DC

## 2020-01-02 DIAGNOSIS — Z930 Tracheostomy status: Secondary | ICD-10-CM | POA: Diagnosis not present

## 2020-01-04 ENCOUNTER — Encounter: Payer: Self-pay | Admitting: Orthopedic Surgery

## 2020-01-04 NOTE — Progress Notes (Signed)
Office Visit Note   Patient: [name]           Date of Birth: [birth year]           MRN: [id number] Visit Date: 12/28/2019 Requested by: [name], MD [place],  [place] [address] PCP: [name], MD  Subjective: No chief complaint on file.   HPI: [name] is a patient with low back pain.  She has a long history of low back pain but has been much worse recently.  She reports radicular pain going down both legs but it is worse in the right leg.  Reports some subjective weakness as well.  This is a pain that she really cannot live with.  Her shoulder is doing reasonably well.  Denies any red flag symptoms such as bowel and bladder symptoms saddle paresthesias or fevers.  She did have MRI scan in 2018 and had injections done at that time but her symptoms have been acutely worse over the last 6 months and they have been progressive and debilitating.              ROS: All systems reviewed are negative as they relate to the chief complaint within the history of present illness.  Patient denies  fevers or chills.   Assessment & Plan: Visit Diagnoses:  1. Chronic low back pain, unspecified back pain laterality, unspecified whether sciatica present     Plan: Impression is low back pain likely with progression of arthritis in degenerative changes which is currently compressing nerves.  I think it would be reasonable to obtain another MRI scan in preparation for possible surgical intervention.  She had reasonable results from [facility] in the past but at this point they have been short-lived and she is more interested in a long-term solution.  We will put her on a steroid Dosepak and see if that can help out enough until we get the MRI scan.  ESI's versus surgical consultation to follow after those injections.  Follow-Up Instructions: Return for after MRI.   Orders:  Orders Placed This Encounter  Procedures  . MR Lumbar Spine w/o contrast   Meds ordered  this encounter  Medications  . predniSONE (STERAPRED UNI-PAK 21 TAB) 10 MG (21) TBPK tablet    Sig: Take as directed on package    Dispense:  21 tablet    Refill:  0      Procedures: No procedures performed   Clinical Data: No additional findings.  Objective: Vital Signs: LMP  (LMP Unknown)   Physical Exam:   Constitutional: Patient appears well-developed HEENT:  Head: Normocephalic Eyes:EOM are normal Neck: Normal range of motion Cardiovascular: Normal rate Pulmonary/chest: Effort normal Neurologic: Patient is alert Skin: Skin is warm Psychiatric: Patient has normal mood and affect    Ortho Exam: Ortho exam demonstrates normal gait alignment.  She has no clonus.  Pedal pulses palpable.  Does have positive nerve root tension signs bilaterally but no groin pain with internal X rotation of the leg.  No other masses lymphadenopathy or skin changes noted in that back or knee region.  No muscle atrophy in the legs.  Specialty Comments:  No specialty comments available.  Imaging: No results found.   PMFS History: Patient Active Problem List   Diagnosis Date Noted  . Rotator cuff tear 07/28/2019  . Diabetes ([place]) 04/12/2019  . Acute asthma exacerbation 03/22/2019  . Chest pain of uncertain etiology 123456  . Lactic acidosis 03/22/2019  . Chronic right shoulder  pain   . Community acquired pneumonia 10/25/2018  . Asthma, chronic, unspecified asthma severity, with acute exacerbation 10/25/2018  . Acute respiratory failure with hypoxia and hypercapnia ([place]) 10/23/2018  . Acute pain of right shoulder due to trauma 10/05/2018  . Chronic bilateral low back pain without sciatica 10/05/2018  . Dysphonia 10/05/2018  . Severe asthma with exacerbation 10/05/2018  . Rotator cuff arthropathy, right 06/15/2018  . AKI (acute kidney injury) ([place]) 03/06/2018  . Carpal tunnel syndrome 01/18/2018  . Normocytic anemia 05/06/2017  . Tracheostomy status ([place]) 04/28/2017  .  Tracheostomy dependent ([place]) 03/26/2017  . Subglottic stenosis 03/06/2017  . Tracheal stenosis 03/04/2017  . Acute blood loss anemia   . Generalized anxiety disorder   . Hypoalbuminemia due to protein-calorie malnutrition ([place])   . Morbid obesity ([place])   . Chronic pain syndrome   . Gastritis and gastroduodenitis 02/22/2016  . Depression 02/22/2016  . Migraine 01/30/2015  . Bulging lumbar disc 09/05/2013  . Essential hypertension 09/05/2013  . Allergic rhinitis, seasonal 08/11/2012  . Chronic cough 08/11/2012  . Sleep apnea, obstructive 12/04/2011   Past Medical History:  Diagnosis Date  . Anxiety   . Arthritis   . Asthma   . Chronic back pain   . Chronic chest pain    resolved, no problems since 2019 per patient 07/27/19  . COPD (chronic obstructive pulmonary disease) ([place])   . Depression   . DM (diabetes mellitus) ([place])    INSULIN DEPENDENT - TYPE 1  . GERD (gastroesophageal reflux disease)   . Headache(784.0)   . Hyperlipidemia    no med, diet controlled  . Hypertension   . Hypokalemia   . Respiratory disease 05/2017  . Sleep apnea    does not use CPAP due to trach  . Tracheostomy in place [facility]) 02/2017    Family History  Problem Relation Age of Onset  . Heart attack Mother   . Stroke Mother   . Diabetes Mother   . Hypertension Mother   . Arthritis Mother   . Stroke Father   . Hypertension Sister   . Diabetes Sister   . Seizures Brother   . Diabetes Brother     Past Surgical History:  Procedure Laterality Date  . ABDOMINAL HYSTERECTOMY  2009  . APPENDECTOMY    . CESAREAN SECTION     x 3  . CHOLECYSTECTOMY N/A 03/02/2014   Procedure: LAPAROSCOPIC CHOLECYSTECTOMY;  Surgeon: Joyice Faster. Cornett, MD;  Location: [place];  Service: General;  Laterality: N/A;  . HERNIA REPAIR    . PANENDOSCOPY N/A 03/04/2017   Procedure: PANENDOSCOPY WITH POSSIBLE FOREIGN BODY REMOVAL;  Surgeon: Jodi Marble, MD;  Location: [place];  Service: ENT;  Laterality: N/A;  . ROTATOR CUFF  REPAIR Left   . ROTATOR CUFF REPAIR Right 07/27/2019  . SHOULDER ARTHROSCOPY WITH SUBACROMIAL DECOMPRESSION, ROTATOR CUFF REPAIR AND BICEP TENDON REPAIR Right 07/28/2019   Procedure: RIGHT SHOULDER ARTHROSCOP, MINI OPEN ROTATOR CUFF TEAR REPAIR,  BICEPS TENODESIS, DISTAL CLAVICLE EXCISION;  Surgeon: Meredith Pel, MD;  Location: [facility];  Service: Orthopedics;  Laterality: Right;  . TRACHEOSTOMY  02/2017  . VESICOVAGINAL FISTULA CLOSURE W/ TAH  2009   Social History   Occupational History  . Not on file  Tobacco Use  . Smoking status: Former Smoker    Packs/day: 0.25    Years: 22.00    Pack years: 5.50    Types: Cigarettes    Quit date: 01/20/2017    Years since quitting: 2.9  .  Smokeless tobacco: Never Used  Substance and Sexual Activity  . Alcohol use: Yes    Alcohol/week: 0.0 standard drinks    Comment: socially 1-2 glasses wine  . Drug use: Yes    Types: Marijuana    Comment: Last Use Jul 13, 2019  . Sexual activity: Yes    Partners: Male    Birth control/protection: None

## 2020-01-06 ENCOUNTER — Other Ambulatory Visit: Payer: Self-pay

## 2020-01-06 ENCOUNTER — Other Ambulatory Visit: Payer: Self-pay | Admitting: Nurse Practitioner

## 2020-01-06 MED ORDER — TRAMADOL HCL 50 MG PO TABS: 50.0000 mg | ORAL_TABLET | Freq: Every day | ORAL | 0 refills | Status: DC | PRN

## 2020-01-06 NOTE — Telephone Encounter (Signed)
Pls advise. Thanks.  

## 2020-01-20 DIAGNOSIS — E1142 Type 2 diabetes mellitus with diabetic polyneuropathy: Secondary | ICD-10-CM | POA: Diagnosis not present

## 2020-01-20 DIAGNOSIS — Z430 Encounter for attention to tracheostomy: Secondary | ICD-10-CM | POA: Diagnosis not present

## 2020-01-20 DIAGNOSIS — Z87891 Personal history of nicotine dependence: Secondary | ICD-10-CM | POA: Diagnosis not present

## 2020-01-20 DIAGNOSIS — J386 Stenosis of larynx: Secondary | ICD-10-CM | POA: Diagnosis not present

## 2020-01-20 DIAGNOSIS — J3802 Paralysis of vocal cords and larynx, bilateral: Secondary | ICD-10-CM | POA: Diagnosis not present

## 2020-01-20 DIAGNOSIS — J384 Edema of larynx: Secondary | ICD-10-CM | POA: Diagnosis not present

## 2020-01-26 DIAGNOSIS — Z930 Tracheostomy status: Secondary | ICD-10-CM | POA: Diagnosis not present

## 2020-01-26 DIAGNOSIS — J449 Chronic obstructive pulmonary disease, unspecified: Secondary | ICD-10-CM | POA: Diagnosis not present

## 2020-01-26 DIAGNOSIS — J9610 Chronic respiratory failure, unspecified whether with hypoxia or hypercapnia: Secondary | ICD-10-CM | POA: Diagnosis not present

## 2020-01-26 DIAGNOSIS — J45909 Unspecified asthma, uncomplicated: Secondary | ICD-10-CM | POA: Diagnosis not present

## 2020-01-30 ENCOUNTER — Ambulatory Visit
Admission: RE | Admit: 2020-01-30 | Discharge: 2020-01-30 | Disposition: A | Discharge status: Home / Self Care | Payer: Medicare Other | Source: Ambulatory Visit | Attending: Orthopedic Surgery | Admitting: Orthopedic Surgery

## 2020-01-30 ENCOUNTER — Other Ambulatory Visit: Payer: Self-pay

## 2020-01-30 DIAGNOSIS — M545 Low back pain: Secondary | ICD-10-CM | POA: Diagnosis not present

## 2020-01-30 DIAGNOSIS — G8929 Other chronic pain: Secondary | ICD-10-CM

## 2020-02-01 ENCOUNTER — Telehealth: Payer: Self-pay | Admitting: Orthopedic Surgery

## 2020-02-01 ENCOUNTER — Ambulatory Visit: Payer: Medicare Other | Admitting: Orthopedic Surgery

## 2020-02-01 DIAGNOSIS — G8929 Other chronic pain: Secondary | ICD-10-CM

## 2020-02-01 NOTE — Telephone Encounter (Signed)
Can either of you advise? 

## 2020-02-01 NOTE — Telephone Encounter (Signed)
Pt called stating she's having back pain and would like to know if Dr. Marlou Sa can call her to go over her results?   [phone]

## 2020-02-01 NOTE — Telephone Encounter (Signed)
I called the patient.  I do not think she has a surgical problem with her back.  She would likely do well with epidural steroid injections with Dr. Ernestina Patches.  Can you send her for that thanks.

## 2020-02-01 NOTE — Telephone Encounter (Signed)
thx

## 2020-02-01 NOTE — Telephone Encounter (Signed)
Placed referral  

## 2020-02-29 ENCOUNTER — Telehealth: Payer: Self-pay | Admitting: Physical Medicine and Rehabilitation

## 2020-02-29 NOTE — Telephone Encounter (Signed)
Patient called to cancel her appointment.     Patient advised she will call back to reschedule

## 2020-02-29 NOTE — Telephone Encounter (Signed)
Appointment cancelled

## 2020-03-01 ENCOUNTER — Encounter: Payer: Medicare Other | Admitting: Physical Medicine and Rehabilitation

## 2020-03-14 ENCOUNTER — Other Ambulatory Visit: Payer: Self-pay | Admitting: Orthopedic Surgery

## 2020-03-14 ENCOUNTER — Other Ambulatory Visit: Payer: Self-pay | Admitting: Family Medicine

## 2020-03-14 ENCOUNTER — Other Ambulatory Visit: Payer: Self-pay

## 2020-03-14 DIAGNOSIS — A084 Viral intestinal infection, unspecified: Secondary | ICD-10-CM

## 2020-03-15 MED ORDER — TRAMADOL HCL 50 MG PO TABS: 50.0000 mg | ORAL_TABLET | Freq: Every day | ORAL | 0 refills | Status: DC | PRN

## 2020-03-15 MED ORDER — ACETAMINOPHEN 325 MG PO TABS: 325.0000 mg | ORAL_TABLET | Freq: Four times a day (QID) | ORAL | 0 refills | Status: DC | PRN

## 2020-03-15 NOTE — Telephone Encounter (Signed)
Ok to rf? 

## 2020-03-16 MED ORDER — ONDANSETRON HCL 4 MG PO TABS: 4.0000 mg | ORAL_TABLET | Freq: Three times a day (TID) | ORAL | 0 refills | Status: DC | PRN

## 2020-03-16 MED ORDER — HYDROCOD POLST-CPM POLST ER 10-8 MG/5ML PO SUER: 5.0000 mL | Freq: Two times a day (BID) | ORAL | 0 refills | Status: DC | PRN

## 2020-03-20 ENCOUNTER — Encounter: Payer: Self-pay | Admitting: Endocrinology

## 2020-03-20 ENCOUNTER — Other Ambulatory Visit: Payer: Self-pay

## 2020-03-20 ENCOUNTER — Ambulatory Visit (INDEPENDENT_AMBULATORY_CARE_PROVIDER_SITE_OTHER): Payer: Medicare Other | Admitting: Endocrinology

## 2020-03-20 VITALS — BP 124/76 | HR 102 | Ht 61.0 in | Wt 236.4 lb

## 2020-03-20 DIAGNOSIS — E109 Type 1 diabetes mellitus without complications: Secondary | ICD-10-CM

## 2020-03-20 LAB — POCT GLYCOSYLATED HEMOGLOBIN (HGB A1C): Hemoglobin A1C: 7.5 % — AB (ref 4.0–5.6)

## 2020-03-20 MED ORDER — CLOTRIMAZOLE-BETAMETHASONE 1-0.05 % EX CREA: 1.0000 "application " | TOPICAL_CREAM | Freq: Three times a day (TID) | CUTANEOUS | 2 refills | Status: DC | PRN

## 2020-03-20 NOTE — Progress Notes (Signed)
Subjective:    Patient ID: [name], female    DOB: Jun 25, 1971, 49 y.o.   MRN: [id number]  HPI Pt returns for f/u of diabetes mellitus: DM type: 1 Dx'ed: 5027 Complications: stage 3 CRI and PN Therapy: insulin since 2012, and Ozempic GDM: never DKA: once, in 2020 Severe hypoglycemia: never.   Pancreatitis: never.   Other: she declines weight loss surgery; edema precludes pioglitizone rx; she did not tolerate metformin (diarrhea); she is not a candidate for multiple daily injections, due to missing insulin doses.    Interval history: Pt says she seldom misses the insulin. Pt states cbg varies from 125-190.  It is in general higher as the day goes on, but not necessarily so.  She seldom has hypoglycemia, and these episodes are mild.   Past Medical History:  Diagnosis Date  . Anxiety   . Arthritis   . Asthma   . Chronic back pain   . Chronic chest pain    resolved, no problems since 2019 per patient 07/27/19  . COPD (chronic obstructive pulmonary disease) ([place])   . Depression   . DM (diabetes mellitus) ([place])    INSULIN DEPENDENT - TYPE 1  . GERD (gastroesophageal reflux disease)   . Headache(784.0)   . Hyperlipidemia    no med, diet controlled  . Hypertension   . Hypokalemia   . Respiratory disease 05/2017  . Sleep apnea    does not use CPAP due to trach  . Tracheostomy in place [facility]) 02/2017    Past Surgical History:  Procedure Laterality Date  . ABDOMINAL HYSTERECTOMY  2009  . APPENDECTOMY    . CESAREAN SECTION     x 3  . CHOLECYSTECTOMY N/A 03/02/2014   Procedure: LAPAROSCOPIC CHOLECYSTECTOMY;  Surgeon: Joyice Faster. Cornett, MD;  Location: [place];  Service: General;  Laterality: N/A;  . HERNIA REPAIR    . PANENDOSCOPY N/A 03/04/2017   Procedure: PANENDOSCOPY WITH POSSIBLE FOREIGN BODY REMOVAL;  Surgeon: Jodi Marble, MD;  Location: [place];  Service: ENT;  Laterality: N/A;  . ROTATOR CUFF REPAIR Left   . ROTATOR CUFF REPAIR Right 07/27/2019  .  SHOULDER ARTHROSCOPY WITH SUBACROMIAL DECOMPRESSION, ROTATOR CUFF REPAIR AND BICEP TENDON REPAIR Right 07/28/2019   Procedure: RIGHT SHOULDER ARTHROSCOP, MINI OPEN ROTATOR CUFF TEAR REPAIR,  BICEPS TENODESIS, DISTAL CLAVICLE EXCISION;  Surgeon: Meredith Pel, MD;  Location: [place];  Service: Orthopedics;  Laterality: Right;  . TRACHEOSTOMY  02/2017  . VESICOVAGINAL FISTULA CLOSURE W/ TAH  2009    Social History   Socioeconomic History  . Marital status: Married    Spouse name: Not on file  . Number of children: Not on file  . Years of education: Not on file  . Highest education level: Not on file  Occupational History  . Not on file  Tobacco Use  . Smoking status: Former Smoker    Packs/day: 0.25    Years: 22.00    Pack years: 5.50    Types: Cigarettes    Quit date: 01/20/2017    Years since quitting: 3.1  . Smokeless tobacco: Never Used  Vaping Use  . Vaping Use: Never used  Substance and Sexual Activity  . Alcohol use: Yes    Alcohol/week: 0.0 standard drinks    Comment: socially 1-2 glasses wine  . Drug use: Yes    Types: Marijuana    Comment: Last Use Jul 13, 2019  . Sexual activity: Yes    Partners: Male  Birth control/protection: None  Other Topics Concern  . Not on file  Social History Narrative  . Not on file   Social Determinants of Health   Financial Resource Strain:   . Difficulty of Paying Living Expenses:   Food Insecurity:   . Worried About Charity fundraiser in the Last Year:   . Arboriculturist in the Last Year:   Transportation Needs:   . Film/video editor (Medical):   Marland Kitchen Lack of Transportation (Non-Medical):   Physical Activity:   . Days of Exercise per Week:   . Minutes of Exercise per Session:   Stress:   . Feeling of Stress :   Social Connections:   . Frequency of Communication with Friends and Family:   . Frequency of Social Gatherings with Friends and Family:   . Attends Religious Services:   . Active Member of Clubs or  Organizations:   . Attends Archivist Meetings:   Marland Kitchen Marital Status:   Intimate Partner Violence:   . Fear of Current or Ex-Partner:   . Emotionally Abused:   Marland Kitchen Physically Abused:   . Sexually Abused:     Current Outpatient Medications on File Prior to Visit  Medication Sig Dispense Refill  . acetaminophen (TYLENOL) 325 MG tablet Take 1-2 tablets (325-650 mg total) by mouth every 6 (six) hours as needed for mild pain (pain score 1-3 or temp > 100.5). 60 tablet 0  . albuterol (PROAIR HFA) 108 (90 Base) MCG/ACT inhaler Inhale 2 puffs into the lungs every 6 (six) hours as needed for wheezing or shortness of breath. 18 g 6  . albuterol (PROVENTIL) (2.5 MG/3ML) 0.083% nebulizer solution Take 3 mLs (2.5 mg total) by nebulization every 6 (six) hours as needed for wheezing or shortness of breath. 75 mL 3  . amLODipine (NORVASC) 10 MG tablet Take 1 tablet (10 mg total) by mouth daily. 90 tablet 1  . aspirin EC 81 MG tablet Take 1 tablet (81 mg total) by mouth daily. 14 tablet 0  . atorvastatin (LIPITOR) 20 MG tablet Take 1 tablet (20 mg total) by mouth daily. 90 tablet 1  . benzonatate (TESSALON) 100 MG capsule Take 1 capsule (100 mg total) by mouth 2 (two) times daily as needed for cough. 20 capsule 0  . Blood Glucose Monitoring Suppl (ONETOUCH VERIO) w/Device KIT 1 each by Does not apply route daily. Use to monitor glucose levels daily; E11.9 1 kit 0  . budesonide-formoterol (SYMBICORT) 160-4.5 MCG/ACT inhaler Inhale 2 puffs into the lungs 2 (two) times daily. 3 Inhaler 6  . buPROPion (WELLBUTRIN SR) 150 MG 12 hr tablet Take 1 tablet (150 mg total) by mouth 2 (two) times daily. 60 tablet 6  . carvedilol (COREG) 25 MG tablet Take 1 tablet (25 mg total) by mouth 2 (two) times daily with a meal. 180 tablet 1  . chlorpheniramine-HYDROcodone (TUSSIONEX PENNKINETIC ER) 10-8 MG/5ML SUER Take 5 mLs by mouth every 12 (twelve) hours as needed for cough. 140 mL 0  . dicyclomine (BENTYL) 20 MG tablet  Take 1 tablet (20 mg total) by mouth every 12 (twelve) hours as needed (for abdominal pain/cramping). 20 tablet 0  . furosemide (LASIX) 20 MG tablet Take 1 tablet (20 mg total) by mouth daily. 30 tablet 2  . gabapentin (NEURONTIN) 300 MG capsule Take 2 capsules (600 mg total) by mouth 2 (two) times daily. 360 capsule 1  . glucose blood (ONETOUCH VERIO) test strip Use to monitor glucose levels  daily; E11.9 100 each 2  . HYDROcodone-acetaminophen (NORCO/VICODIN) 5-325 MG tablet Take 1 tablet by mouth daily as needed for moderate pain. 20 tablet 0  . hydrOXYzine (ATARAX/VISTARIL) 25 MG tablet Take 1 tablet (25 mg total) by mouth 3 (three) times daily as needed for anxiety. 60 tablet 1  . Insulin Degludec (TRESIBA FLEXTOUCH) 200 UNIT/ML SOPN Inject 140 Units into the skin daily. 12 pen 11  . Insulin Pen Needle (FIFTY50 PEN NEEDLES) 31G X 8 MM MISC Inject 1 Units as directed daily.    Marland Kitchen lisinopril (ZESTRIL) 10 MG tablet Take 1 tablet (10 mg total) by mouth daily. 90 tablet 1  . Melatonin 3 MG TABS Take 3 mg by mouth at bedtime as needed (for sleep).     . montelukast (SINGULAIR) 10 MG tablet Take 1 tablet (10 mg total) by mouth at bedtime. 90 tablet 1  . naproxen (NAPROSYN) 500 MG tablet Take 1 tablet (500 mg total) by mouth 2 (two) times daily with a meal. 60 tablet 0  . ondansetron (ZOFRAN) 4 MG tablet Take 1 tablet (4 mg total) by mouth every 8 (eight) hours as needed for nausea or vomiting. 20 tablet 0  . OneTouch Delica Lancets 31S MISC 1 each by Does not apply route daily. Use to monitor glucose levels daily; E11.9 100 each 2  . oxyCODONE (OXY IR/ROXICODONE) 5 MG immediate release tablet Take 1 tablet (5 mg total) by mouth every 12 (twelve) hours as needed for moderate pain (pain score 4-6). 35 tablet 0  . pantoprazole (PROTONIX) 20 MG tablet Take 1 tablet (20 mg total) by mouth daily. 30 tablet 0  . promethazine (PHENERGAN) 25 MG tablet Take 1 tablet (25 mg total) by mouth every 6 (six) hours as  needed for nausea or vomiting. 12 tablet 0  . Semaglutide, 1 MG/DOSE, (OZEMPIC, 1 MG/DOSE,) 2 MG/1.5ML SOPN Inject 1 mg into the skin once a week. 2 pen 11  . sertraline (ZOLOFT) 100 MG tablet Take 1 tablet (100 mg total) by mouth at bedtime. 90 tablet 1  . SUMAtriptan (IMITREX) 100 MG tablet At the onset of a migraine; may repeat in 2 hours. Max daily dose 218m 20 tablet 1  . tiZANidine (ZANAFLEX) 4 MG tablet Take 1 tablet (4 mg total) by mouth every 8 (eight) hours as needed for muscle spasms. 90 tablet 3  . traMADol (ULTRAM) 50 MG tablet Take 1 tablet (50 mg total) by mouth daily as needed. 20 tablet 0  . [DISCONTINUED] fluticasone-salmeterol (ADVAIR HFA) 230-21 MCG/ACT inhaler Inhale 2 puffs into the lungs 2 (two) times daily. 1 Inhaler 3   No current facility-administered medications on file prior to visit.    Allergies  Allergen Reactions  . Reglan [Metoclopramide] Other (See Comments)    Panic attack    Family History  Problem Relation Age of Onset  . Heart attack Mother   . Stroke Mother   . Diabetes Mother   . Hypertension Mother   . Arthritis Mother   . Stroke Father   . Hypertension Sister   . Diabetes Sister   . Seizures Brother   . Diabetes Brother     BP 124/76   Pulse (!) 102   Ht 5' 1"  (1.549 m)   Wt 236 lb 6.4 oz (107.2 kg)   LMP  (LMP Unknown)   SpO2 99%   BMI 44.67 kg/m    Review of Systems She has lost 11 lbs since last ov here.  Objective:   Physical Exam VITAL SIGNS:  See vs page GENERAL: no distress Pulses: dorsalis pedis intact bilat.   MSK: no deformity of the feet CV: no leg edema Skin:  no ulcer on the feet.  normal color and temp on the feet.  There is eczematous rash on the right foot Neuro: sensation is intact to touch on the feet  Lab Results  Component Value Date   HGBA1C 7.5 (A) 03/20/2020    Lab Results  Component Value Date   TSH 0.84 04/12/2019   Lab Results  Component Value Date   CREATININE 1.69 (H) 10/09/2019    BUN 21 (H) 10/09/2019   NA 140 10/09/2019   K 3.4 (L) 10/09/2019   CL 104 10/09/2019   CO2 20 (L) 10/09/2019       Assessment & Plan:  Type 1 DM, with stage 3 CRI. Hypoglycemia, due to insulin: this limits aggressiveness of glycemic control.  Foot rash, heat rash vs tinea pedis.   Patient Instructions  Please continue the same medications for diabetes.  I have sent a prescription to your pharmacy, for the foot rash check your blood sugar twice a day.  vary the time of day when you check, between before the 3 meals, and at bedtime.  also check if you have symptoms of your blood sugar being too high or too low.  please keep a record of the readings and bring it to your next appointment here (or you can bring the meter itself).  You can write it on any piece of paper.  please call us sooner if your blood sugar goes below 70, or if you have a lot of readings over 200. Please come back for a follow-up appointment in 3 months.

## 2020-03-20 NOTE — Patient Instructions (Addendum)
Please continue the same medications for diabetes.  I have sent a prescription to your pharmacy, for the foot rash check your blood sugar twice a day.  vary the time of day when you check, between before the 3 meals, and at bedtime.  also check if you have symptoms of your blood sugar being too high or too low.  please keep a record of the readings and bring it to your next appointment here (or you can bring the meter itself).  You can write it on any piece of paper.  please call us sooner if your blood sugar goes below 70, or if you have a lot of readings over 200. Please come back for a follow-up appointment in 3 months.

## 2020-03-29 DIAGNOSIS — Z930 Tracheostomy status: Secondary | ICD-10-CM | POA: Diagnosis not present

## 2020-04-07 ENCOUNTER — Other Ambulatory Visit: Payer: Self-pay | Admitting: Family Medicine

## 2020-04-07 DIAGNOSIS — E7800 Pure hypercholesterolemia, unspecified: Secondary | ICD-10-CM

## 2020-04-07 DIAGNOSIS — I10 Essential (primary) hypertension: Secondary | ICD-10-CM

## 2020-04-07 NOTE — Telephone Encounter (Signed)
Requested Prescriptions  Pending Prescriptions Disp Refills   lisinopril (ZESTRIL) 10 MG tablet [Pharmacy Med Name: Lisinopril 10 MG Oral Tablet] 90 tablet 0    Sig: Take 1 tablet by mouth daily     Cardiovascular:  ACE Inhibitors Failed - 04/07/2020  4:11 PM      Failed - Cr in normal range and within 180 days    Creat  Date Value Ref Range Status  08/01/2015 0.93 0.50 - 1.10 mg/dL Final   Creatinine, Ser  Date Value Ref Range Status  10/09/2019 1.69 (H) 0.44 - 1.00 mg/dL Final   Creatinine,U  Date Value Ref Range Status  01/30/2016 352.5 mg/dL Final         Failed - K in normal range and within 180 days    Potassium  Date Value Ref Range Status  10/09/2019 3.4 (L) 3.5 - 5.1 mmol/L Final         Failed - Valid encounter within last 6 months    Recent Outpatient Visits          6 months ago Viral gastroenteritis   [place], Charlane Ferretti, MD   1 year ago Musculoskeletal chest pain   [place], Charlane Ferretti, MD   1 year ago Type 2 diabetes mellitus with diabetic polyneuropathy, without long-term current use of insulin ([place])   [place], Charlane Ferretti, MD   1 year ago Acute pain of right shoulder due to trauma   [place] Elsie Stain, MD   1 year ago Type 2 diabetes mellitus with diabetic polyneuropathy, without long-term current use of insulin ([place])   [place], Charlane Ferretti, MD      Future Appointments            In 3 days [name], MD [place] - Patient is not pregnant      Passed - Last BP in normal range    BP Readings from Last 1 Encounters:  03/20/20 124/76          atorvastatin (LIPITOR) 20 MG tablet [Pharmacy Med Name: Atorvastatin Calcium 20 MG Oral Tablet] 90 tablet 0    Sig: Take 1 tablet by mouth daily      Cardiovascular:  Antilipid - Statins Failed - 04/07/2020  4:11 PM      Failed - Total Cholesterol in normal range and within 360 days    Cholesterol, Total  Date Value Ref Range Status  06/15/2018 165 100 - 199 mg/dL Final         Failed - LDL in normal range and within 360 days    LDL Calculated  Date Value Ref Range Status  06/15/2018 89 0 - 99 mg/dL Final         Failed - HDL in normal range and within 360 days    HDL  Date Value Ref Range Status  06/15/2018 51 >39 mg/dL Final         Failed - Triglycerides in normal range and within 360 days    Triglycerides  Date Value Ref Range Status  06/15/2018 124 0 - 149 mg/dL Final         Passed - Patient is not pregnant      Passed - Valid encounter within last 12 months    Recent Outpatient Visits  6 months ago Viral gastroenteritis   [place], [place], MD   1 year ago Musculoskeletal chest pain   [place], [place], MD   1 year ago Type 2 diabetes mellitus with diabetic polyneuropathy, without long-term current use of insulin ([place])   [place], Charlane Ferretti, MD   1 year ago Acute pain of right shoulder due to trauma   [place] Elsie Stain, MD   1 year ago Type 2 diabetes mellitus with diabetic polyneuropathy, without long-term current use of insulin ([place])   [place], Enobong, MD      Future Appointments            In 3 days [name], MD [place]

## 2020-04-10 ENCOUNTER — Ambulatory Visit: Payer: Medicare Other | Attending: Family Medicine | Admitting: Family Medicine

## 2020-04-10 ENCOUNTER — Encounter: Payer: Self-pay | Admitting: Family Medicine

## 2020-04-10 ENCOUNTER — Other Ambulatory Visit: Payer: Self-pay

## 2020-04-10 VITALS — BP 149/88 | HR 101 | Ht 61.0 in | Wt 236.0 lb

## 2020-04-10 DIAGNOSIS — Z930 Tracheostomy status: Secondary | ICD-10-CM

## 2020-04-10 DIAGNOSIS — M5126 Other intervertebral disc displacement, lumbar region: Secondary | ICD-10-CM

## 2020-04-10 DIAGNOSIS — Z1159 Encounter for screening for other viral diseases: Secondary | ICD-10-CM | POA: Diagnosis not present

## 2020-04-10 DIAGNOSIS — N1831 Chronic kidney disease, stage 3a: Secondary | ICD-10-CM

## 2020-04-10 DIAGNOSIS — J4540 Moderate persistent asthma, uncomplicated: Secondary | ICD-10-CM | POA: Diagnosis not present

## 2020-04-10 DIAGNOSIS — I10 Essential (primary) hypertension: Secondary | ICD-10-CM | POA: Diagnosis not present

## 2020-04-10 DIAGNOSIS — G43119 Migraine with aura, intractable, without status migrainosus: Secondary | ICD-10-CM | POA: Diagnosis not present

## 2020-04-10 DIAGNOSIS — E1021 Type 1 diabetes mellitus with diabetic nephropathy: Secondary | ICD-10-CM | POA: Diagnosis not present

## 2020-04-10 DIAGNOSIS — M5136 Other intervertebral disc degeneration, lumbar region: Secondary | ICD-10-CM

## 2020-04-10 DIAGNOSIS — F330 Major depressive disorder, recurrent, mild: Secondary | ICD-10-CM

## 2020-04-10 DIAGNOSIS — E785 Hyperlipidemia, unspecified: Secondary | ICD-10-CM

## 2020-04-10 DIAGNOSIS — Z1211 Encounter for screening for malignant neoplasm of colon: Secondary | ICD-10-CM

## 2020-04-10 DIAGNOSIS — E1069 Type 1 diabetes mellitus with other specified complication: Secondary | ICD-10-CM

## 2020-04-10 LAB — GLUCOSE, POCT (MANUAL RESULT ENTRY): POC Glucose: 188 mg/dl — AB (ref 70–99)

## 2020-04-10 MED ORDER — LISINOPRIL 20 MG PO TABS: 20.0000 mg | ORAL_TABLET | Freq: Every day | ORAL | 1 refills | Status: DC

## 2020-04-10 MED ORDER — TIZANIDINE HCL 4 MG PO TABS: 4.0000 mg | ORAL_TABLET | Freq: Three times a day (TID) | ORAL | 3 refills | Status: DC | PRN

## 2020-04-10 MED ORDER — MONTELUKAST SODIUM 10 MG PO TABS: 10.0000 mg | ORAL_TABLET | Freq: Every day | ORAL | 1 refills | Status: DC

## 2020-04-10 MED ORDER — CARVEDILOL 25 MG PO TABS: 25.0000 mg | ORAL_TABLET | Freq: Two times a day (BID) | ORAL | 1 refills | Status: DC

## 2020-04-10 MED ORDER — SERTRALINE HCL 100 MG PO TABS: 100.0000 mg | ORAL_TABLET | Freq: Every day | ORAL | 1 refills | Status: DC

## 2020-04-10 MED ORDER — AMLODIPINE BESYLATE 10 MG PO TABS: 10.0000 mg | ORAL_TABLET | Freq: Every day | ORAL | 1 refills | Status: DC

## 2020-04-10 MED ORDER — BUPROPION HCL ER (SR) 150 MG PO TB12: 150.0000 mg | ORAL_TABLET | Freq: Two times a day (BID) | ORAL | 6 refills | Status: DC

## 2020-04-10 MED ORDER — ATORVASTATIN CALCIUM 20 MG PO TABS: 20.0000 mg | ORAL_TABLET | Freq: Every day | ORAL | 1 refills | Status: DC

## 2020-04-10 MED ORDER — SUMATRIPTAN SUCCINATE 100 MG PO TABS: ORAL_TABLET | ORAL | 1 refills | Status: DC

## 2020-04-10 MED ORDER — TOPIRAMATE 100 MG PO TABS: 100.0000 mg | ORAL_TABLET | Freq: Two times a day (BID) | ORAL | 3 refills | Status: DC

## 2020-04-10 MED ORDER — BUDESONIDE-FORMOTEROL FUMARATE 160-4.5 MCG/ACT IN AERO: 2.0000 | INHALATION_SPRAY | Freq: Two times a day (BID) | RESPIRATORY_TRACT | 6 refills | Status: DC

## 2020-04-10 MED ORDER — ALBUTEROL SULFATE HFA 108 (90 BASE) MCG/ACT IN AERS: 2.0000 | INHALATION_SPRAY | Freq: Four times a day (QID) | RESPIRATORY_TRACT | 6 refills | Status: DC | PRN

## 2020-04-10 NOTE — Progress Notes (Signed)
Subjective:  Patient ID: [name], female    DOB: Jun 17, 1971  Age: 49 y.o. MRN: [id number]  CC: Hypertension   [name]  is a 49 year old female with a history of type 1 diabetes mellitus (A1c7.5), hypertension, obstructive sleep apnea, asthma, depression, s/p tracheostomy secondary to intubation related tracheal injury, subglottic stenosis (status post balloon dilatation and Kenalog injection of stenosis tracheostomy tube exchange by ENT, status post right rotator cuff surgery in 07/2019 seen for follow-up visit. Diabetes mellitus is managed by endocrine with her last office visit last month. She also had a visit with her ENT specialist at [facility] in 12/2019.  Tracheostomy was changed at that visit.  She continues to use finger plugging when attempting to speak. She continues to have pain around the trach site even without speaking and she has a lot of secretion.  Asthma has been stable with no recent flares. Last migraine lasted a week left her left scalp sore and tingly and it went down her face. It occurs twice a month and lasts for several days.  Review of her med list indicates she is not on Topamax which she was previously on but she is on Imitrex. For hypertension she is compliant with her antihypertensive but her blood pressure is slightly elevated today.   She has bulging discs in her back and is in pain everyday; pain radiates down both legs. She is requesting long term pain control. Lidoderm patch does not provide relief Past Medical History:  Diagnosis Date  . Anxiety   . Arthritis   . Asthma   . Chronic back pain   . Chronic chest pain    resolved, no problems since 2019 per patient 07/27/19  . COPD (chronic obstructive pulmonary disease) ([place])   . Depression   . DM (diabetes mellitus) ([place])    INSULIN DEPENDENT - TYPE 1  . GERD (gastroesophageal reflux disease)   . Headache(784.0)   . Hyperlipidemia     no med, diet controlled  . Hypertension   . Hypokalemia   . Respiratory disease 05/2017  . Sleep apnea    does not use CPAP due to trach  . Tracheostomy in place [facility]) 02/2017    Past Surgical History:  Procedure Laterality Date  . ABDOMINAL HYSTERECTOMY  2009  . APPENDECTOMY    . CESAREAN SECTION     x 3  . CHOLECYSTECTOMY N/A 03/02/2014   Procedure: LAPAROSCOPIC CHOLECYSTECTOMY;  Surgeon: Joyice Faster. Cornett, MD;  Location: [place];  Service: General;  Laterality: N/A;  . HERNIA REPAIR    . PANENDOSCOPY N/A 03/04/2017   Procedure: PANENDOSCOPY WITH POSSIBLE FOREIGN BODY REMOVAL;  Surgeon: Jodi Marble, MD;  Location: [place];  Service: ENT;  Laterality: N/A;  . ROTATOR CUFF REPAIR Left   . ROTATOR CUFF REPAIR Right 07/27/2019  . SHOULDER ARTHROSCOPY WITH SUBACROMIAL DECOMPRESSION, ROTATOR CUFF REPAIR AND BICEP TENDON REPAIR Right 07/28/2019   Procedure: RIGHT SHOULDER ARTHROSCOP, MINI OPEN ROTATOR CUFF TEAR REPAIR,  BICEPS TENODESIS, DISTAL CLAVICLE EXCISION;  Surgeon: Meredith Pel, MD;  Location: [place];  Service: Orthopedics;  Laterality: Right;  . TRACHEOSTOMY  02/2017  . VESICOVAGINAL FISTULA CLOSURE W/ TAH  2009    Family History  Problem Relation Age of Onset  . Heart attack Mother   . Stroke Mother   . Diabetes Mother   . Hypertension Mother   . Arthritis Mother   . Stroke Father   . Hypertension Sister   .  Diabetes Sister   . Seizures Brother   . Diabetes Brother     Allergies  Allergen Reactions  . Reglan [Metoclopramide] Other (See Comments)    Panic attack    Outpatient Medications Prior to Visit  Medication Sig Dispense Refill  . acetaminophen (TYLENOL) 325 MG tablet Take 1-2 tablets (325-650 mg total) by mouth every 6 (six) hours as needed for mild pain (pain score 1-3 or temp > 100.5). 60 tablet 0  . albuterol (PROAIR HFA) 108 (90 Base) MCG/ACT inhaler Inhale 2 puffs into the lungs every 6 (six) hours as needed for wheezing or shortness of breath. 18  g 6  . albuterol (PROVENTIL) (2.5 MG/3ML) 0.083% nebulizer solution Take 3 mLs (2.5 mg total) by nebulization every 6 (six) hours as needed for wheezing or shortness of breath. 75 mL 3  . amLODipine (NORVASC) 10 MG tablet Take 1 tablet (10 mg total) by mouth daily. 90 tablet 1  . aspirin EC 81 MG tablet Take 1 tablet (81 mg total) by mouth daily. 14 tablet 0  . atorvastatin (LIPITOR) 20 MG tablet Take 1 tablet by mouth daily 90 tablet 0  . benzonatate (TESSALON) 100 MG capsule Take 1 capsule (100 mg total) by mouth 2 (two) times daily as needed for cough. 20 capsule 0  . Blood Glucose Monitoring Suppl (ONETOUCH VERIO) w/Device KIT 1 each by Does not apply route daily. Use to monitor glucose levels daily; E11.9 1 kit 0  . budesonide-formoterol (SYMBICORT) 160-4.5 MCG/ACT inhaler Inhale 2 puffs into the lungs 2 (two) times daily. 3 Inhaler 6  . buPROPion (WELLBUTRIN SR) 150 MG 12 hr tablet Take 1 tablet (150 mg total) by mouth 2 (two) times daily. 60 tablet 6  . carvedilol (COREG) 25 MG tablet Take 1 tablet (25 mg total) by mouth 2 (two) times daily with a meal. 180 tablet 1  . chlorpheniramine-HYDROcodone (TUSSIONEX PENNKINETIC ER) 10-8 MG/5ML SUER Take 5 mLs by mouth every 12 (twelve) hours as needed for cough. 140 mL 0  . clotrimazole-betamethasone (LOTRISONE) cream Apply 1 application topically 3 (three) times daily as needed. For rash 45 g 2  . dicyclomine (BENTYL) 20 MG tablet Take 1 tablet (20 mg total) by mouth every 12 (twelve) hours as needed (for abdominal pain/cramping). 20 tablet 0  . furosemide (LASIX) 20 MG tablet Take 1 tablet (20 mg total) by mouth daily. 30 tablet 2  . gabapentin (NEURONTIN) 300 MG capsule Take 2 capsules (600 mg total) by mouth 2 (two) times daily. 360 capsule 1  . glucose blood (ONETOUCH VERIO) test strip Use to monitor glucose levels daily; E11.9 100 each 2  . HYDROcodone-acetaminophen (NORCO/VICODIN) 5-325 MG tablet Take 1 tablet by mouth daily as needed for  moderate pain. 20 tablet 0  . hydrOXYzine (ATARAX/VISTARIL) 25 MG tablet Take 1 tablet (25 mg total) by mouth 3 (three) times daily as needed for anxiety. 60 tablet 1  . Insulin Degludec (TRESIBA FLEXTOUCH) 200 UNIT/ML SOPN Inject 140 Units into the skin daily. 12 pen 11  . Insulin Pen Needle (FIFTY50 PEN NEEDLES) 31G X 8 MM MISC Inject 1 Units as directed daily.    Marland Kitchen lisinopril (ZESTRIL) 10 MG tablet Take 1 tablet by mouth daily 90 tablet 0  . Melatonin 3 MG TABS Take 3 mg by mouth at bedtime as needed (for sleep).     . montelukast (SINGULAIR) 10 MG tablet Take 1 tablet (10 mg total) by mouth at bedtime. 90 tablet 1  . naproxen (NAPROSYN)  500 MG tablet Take 1 tablet (500 mg total) by mouth 2 (two) times daily with a meal. 60 tablet 0  . ondansetron (ZOFRAN) 4 MG tablet Take 1 tablet (4 mg total) by mouth every 8 (eight) hours as needed for nausea or vomiting. 20 tablet 0  . OneTouch Delica Lancets 54S MISC 1 each by Does not apply route daily. Use to monitor glucose levels daily; E11.9 100 each 2  . oxyCODONE (OXY IR/ROXICODONE) 5 MG immediate release tablet Take 1 tablet (5 mg total) by mouth every 12 (twelve) hours as needed for moderate pain (pain score 4-6). 35 tablet 0  . pantoprazole (PROTONIX) 20 MG tablet Take 1 tablet (20 mg total) by mouth daily. 30 tablet 0  . promethazine (PHENERGAN) 25 MG tablet Take 1 tablet (25 mg total) by mouth every 6 (six) hours as needed for nausea or vomiting. 12 tablet 0  . Semaglutide, 1 MG/DOSE, (OZEMPIC, 1 MG/DOSE,) 2 MG/1.5ML SOPN Inject 1 mg into the skin once a week. 2 pen 11  . sertraline (ZOLOFT) 100 MG tablet Take 1 tablet (100 mg total) by mouth at bedtime. 90 tablet 1  . SUMAtriptan (IMITREX) 100 MG tablet At the onset of a migraine; may repeat in 2 hours. Max daily dose 230m 20 tablet 1  . tiZANidine (ZANAFLEX) 4 MG tablet Take 1 tablet (4 mg total) by mouth every 8 (eight) hours as needed for muscle spasms. 90 tablet 3  . traMADol (ULTRAM) 50 MG  tablet Take 1 tablet (50 mg total) by mouth daily as needed. 20 tablet 0   No facility-administered medications prior to visit.     ROS Review of Systems  Constitutional: Negative for activity change, appetite change and fatigue.  HENT: Negative for congestion, sinus pressure and sore throat.   Eyes: Negative for visual disturbance.  Respiratory: Negative for cough, chest tightness, shortness of breath and wheezing.   Cardiovascular: Negative for chest pain and palpitations.  Gastrointestinal: Negative for abdominal distention, abdominal pain and constipation.  Endocrine: Negative for polydipsia.  Genitourinary: Negative for dysuria and frequency.  Musculoskeletal:       See HPI  Skin: Negative for rash.  Neurological: Negative for tremors, light-headedness and numbness.  Hematological: Does not bruise/bleed easily.  Psychiatric/Behavioral: Negative for agitation and behavioral problems.    Objective:  Ht _0  (1.549 m)   Wt 236 lb (107 kg)   LMP  (LMP Unknown)   BMI 44.59 kg/m   BP/Weight 04/10/2020 03/20/2020 35/68/1275 Systolic BP - 11701017 Diastolic BP - 76 1494 Wt. (Lbs) 236 236.4 -  BMI 44.59 44.67 -      Physical Exam Constitutional:      Appearance: She is well-developed. She is obese.  Neck:     Vascular: No JVD.     Comments: Tracheostomy in place Cardiovascular:     Rate and Rhythm: Normal rate.     Heart sounds: Normal heart sounds. No murmur heard.   Pulmonary:     Effort: Pulmonary effort is normal.     Breath sounds: Normal breath sounds. No wheezing or rales.  Chest:     Chest wall: No tenderness.  Abdominal:     General: Bowel sounds are normal. There is no distension.     Palpations: Abdomen is soft. There is no mass.     Tenderness: There is no abdominal tenderness.  Musculoskeletal:     Right lower leg: No edema.     Left lower leg:  No edema.     Comments: Tenderness on palpation of bilateral paraspinal region Negative straight leg  raise bilaterally  Neurological:     Mental Status: She is alert and oriented to person, place, and time.  Psychiatric:        Mood and Affect: Mood normal.     CMP Latest Ref Rng & Units 10/09/2019 07/28/2019 04/27/2019  Glucose 70 - 99 mg/dL 169(H) 130(H) 138(H)  BUN 6 - 20 mg/dL 21(H) 9 19  Creatinine 0.44 - 1.00 mg/dL 1.69(H) 0.99 1.11(H)  Sodium 135 - 145 mmol/L 140 137 138  Potassium 3.5 - 5.1 mmol/L 3.4(L) 3.7 4.1  Chloride 98 - 111 mmol/L 104 105 107  CO2 22 - 32 mmol/L 20(L) 21(L) 23  Calcium 8.9 - 10.3 mg/dL 9.4 9.1 8.9  Total Protein 6.5 - 8.1 g/dL 8.1 - -  Total Bilirubin 0.3 - 1.2 mg/dL 0.7 - -  Alkaline Phos 38 - 126 U/L 85 - -  AST 15 - 41 U/L 18 - -  ALT 0 - 44 U/L 26 - -    Lipid Panel     Component Value Date/Time   CHOL 165 06/15/2018 1031   TRIG 124 06/15/2018 1031   HDL 51 06/15/2018 1031   CHOLHDL 3.2 06/15/2018 1031   CHOLHDL 3.9 02/22/2016 0954   VLDL 26 02/22/2016 0954   LDLCALC 89 06/15/2018 1031    CBC    Component Value Date/Time   WBC 11.1 (H) 10/09/2019 2107   RBC 4.99 10/09/2019 2107   HGB 13.5 10/09/2019 2107   HGB 11.9 04/12/2018 1035   HCT 41.3 10/09/2019 2107   HCT 34.8 04/12/2018 1035   PLT 418 (H) 10/09/2019 2107   PLT 373 04/12/2018 1035   MCV 82.8 10/09/2019 2107   MCV 84 04/12/2018 1035   MCH 27.1 10/09/2019 2107   MCHC 32.7 10/09/2019 2107   RDW 14.2 10/09/2019 2107   RDW 14.6 04/12/2018 1035   LYMPHSABS 4.0 (H) 04/12/2018 1035   MONOABS 1.5 (H) 03/11/2018 0932   EOSABS 0.0 04/12/2018 1035   BASOSABS 0.0 04/12/2018 1035    Lab Results  Component Value Date   HGBA1C 7.5 (A) 03/20/2020    Assessment & Plan:  1. Type 1 diabetes mellitus with stage 3a chronic kidney disease ([place]) Controlled with A1c of 7.5; due to multiple comorbidities, glycemic control will be less strict Diabetes is managed by endocrine She surprisingly had an upward trend in creatinine from 0.99-1.69 in 2 months;  Will repeat renal function  today Advised to avoid NSAIDs - POCT glucose (manual entry) - CMP14+EGFR  2. Intractable migraine with aura without status migrainosus Uncontrolled Topamax was likely discontinued during one of her hospitalizations last year I have restarted it and advised her to commence at 50 mg twice daily for 1 week then increase to 100 mg twice daily going forward - SUMAtriptan (IMITREX) 100 MG tablet; At the onset of a migraine; may repeat in 2 hours. Max daily dose 260m  Dispense: 20 tablet; Refill: 1  3. Moderate persistent asthma without complication No acute exacerbations - albuterol (PROAIR HFA) 108 (90 Base) MCG/ACT inhaler; Inhale 2 puffs into the lungs every 6 (six) hours as needed for wheezing or shortness of breath.  Dispense: 18 g; Refill: 6 - budesonide-formoterol (SYMBICORT) 160-4.5 MCG/ACT inhaler; Inhale 2 puffs into the lungs 2 (two) times daily.  Dispense: 3 Inhaler; Refill: 6 - montelukast (SINGULAIR) 10 MG tablet; Take 1 tablet (10 mg total) by mouth at  bedtime.  Dispense: 90 tablet; Refill: 1  4. Benign essential HTN Uncontrolled Lisinopril dose increased Counseled on blood pressure goal of less than 130/80, low-sodium, DASH diet, medication compliance, 150 minutes of moderate intensity exercise per week. Discussed medication compliance, adverse effects. - amLODipine (NORVASC) 10 MG tablet; Take 1 tablet (10 mg total) by mouth daily.  Dispense: 90 tablet; Refill: 1 - carvedilol (COREG) 25 MG tablet; Take 1 tablet (25 mg total) by mouth 2 (two) times daily with a meal.  Dispense: 180 tablet; Refill: 1 - lisinopril (ZESTRIL) 20 MG tablet; Take 1 tablet (20 mg total) by mouth daily.  Dispense: 90 tablet; Refill: 1  5. Hyperlipidemia due to type 1 diabetes mellitus (HCC) Stable Low-cholesterol diet - atorvastatin (LIPITOR) 20 MG tablet; Take 1 tablet (20 mg total) by mouth daily.  Dispense: 90 tablet; Refill: 1  6. Mild episode of recurrent major depressive disorder  (HCC) Stable - buPROPion (WELLBUTRIN SR) 150 MG 12 hr tablet; Take 1 tablet (150 mg total) by mouth 2 (two) times daily.  Dispense: 60 tablet; Refill: 6 - sertraline (ZOLOFT) 100 MG tablet; Take 1 tablet (100 mg total) by mouth at bedtime.  Dispense: 90 tablet; Refill: 1  7. Need for hepatitis C screening test - HCV RNA quant rflx ultra or genotyp([facility])  8. Screening for colon cancer - Ambulatory referral to Gastroenterology  9. Bulging lumbar disc Uncontrolled on current regimen We will refer to pain clinic Advised I will be unable to refill her controlled antitussive going forward given she will be under pain contract - Ambulatory referral to Pain Clinic - tiZANidine (ZANAFLEX) 4 MG tablet; Take 1 tablet (4 mg total) by mouth every 8 (eight) hours as needed for muscle spasms.  Dispense: 90 tablet; Refill: 3  10. Tracheostomy dependent ([place]) High risk patient with multiple comorbidities.  Discussed with her the importance of receiving the COVID-19 vaccine. Status post trach change 3 months ago Followed up by ENT She continues to have chronic pain at the site Hopefully prescriptions from pain management will be beneficial     [name], MD, FAAFP. [facility] and [place], [place]   04/10/2020, 3:47 PM

## 2020-04-10 NOTE — Progress Notes (Signed)
Having pain in back and all over body she states.  Needs refills on medications.

## 2020-04-10 NOTE — Patient Instructions (Signed)

## 2020-04-11 ENCOUNTER — Encounter: Payer: Self-pay | Admitting: Internal Medicine

## 2020-04-12 ENCOUNTER — Other Ambulatory Visit: Payer: Self-pay

## 2020-04-12 ENCOUNTER — Ambulatory Visit (INDEPENDENT_AMBULATORY_CARE_PROVIDER_SITE_OTHER): Payer: Medicare Other | Admitting: Physical Medicine and Rehabilitation

## 2020-04-12 ENCOUNTER — Ambulatory Visit: Payer: Self-pay

## 2020-04-12 ENCOUNTER — Encounter: Payer: Self-pay | Admitting: Physical Medicine and Rehabilitation

## 2020-04-12 VITALS — BP 137/94 | HR 91

## 2020-04-12 DIAGNOSIS — M5116 Intervertebral disc disorders with radiculopathy, lumbar region: Secondary | ICD-10-CM

## 2020-04-12 LAB — CMP14+EGFR
ALT: 12 IU/L (ref 0–32)
AST: 11 IU/L (ref 0–40)
Albumin/Globulin Ratio: 1.1 — ABNORMAL LOW (ref 1.2–2.2)
Albumin: 3.9 g/dL (ref 3.8–4.8)
Alkaline Phosphatase: 111 IU/L (ref 48–121)
BUN/Creatinine Ratio: 14 (ref 9–23)
BUN: 15 mg/dL (ref 6–24)
Bilirubin Total: <0.2 mg/dL (ref 0.0–1.2)
CO2: 22 mmol/L (ref 20–29)
Calcium: 9 mg/dL (ref 8.7–10.2)
Chloride: 106 mmol/L (ref 96–106)
Creatinine, Ser: 1.1 mg/dL — ABNORMAL HIGH (ref 0.57–1.00)
GFR calc Af Amer: 68 mL/min/{1.73_m2} (ref >59)
GFR calc non Af Amer: 59 mL/min/{1.73_m2} — ABNORMAL LOW (ref >59)
Globulin, Total: 3.4 g/dL (ref 1.5–4.5)
Glucose: 184 mg/dL — ABNORMAL HIGH (ref 65–99)
Potassium: 4.1 mmol/L (ref 3.5–5.2)
Sodium: 142 mmol/L (ref 134–144)
Total Protein: 7.3 g/dL (ref 6.0–8.5)

## 2020-04-12 LAB — HCV RNA QUANT RFLX ULTRA OR GENOTYP: HCV Quant Baseline: NOT DETECTED IU/mL

## 2020-04-12 MED ORDER — METHYLPREDNISOLONE ACETATE 80 MG/ML IJ SUSP
80.0000 mg | Freq: Once | INTRAMUSCULAR | Status: AC
  Administered 2020-04-12: 80 mg

## 2020-04-12 NOTE — Progress Notes (Signed)
Pt states lower back pain. Pt states right and left hip pain. Pt states walking and standing a long time makes worse. Pt states nothing makes feel better. Pt has hx on 12/19/19 pt states it didn't work.  Numeric Pain Rating Scale and Functional Assessment Average Pain 7   In the last MONTH (on 0-10 scale) has pain interfered with the following?  1. General activity like being  able to carry out your everyday physical activities such as walking, climbing stairs, carrying groceries, or moving a chair?  Rating(10)   +Driver, -BT, -Dye Allergies.

## 2020-04-17 ENCOUNTER — Encounter: Payer: Self-pay | Admitting: Family Medicine

## 2020-04-18 ENCOUNTER — Other Ambulatory Visit: Payer: Self-pay | Admitting: Family Medicine

## 2020-04-18 DIAGNOSIS — Z9889 Other specified postprocedural states: Secondary | ICD-10-CM | POA: Diagnosis not present

## 2020-04-18 DIAGNOSIS — M25511 Pain in right shoulder: Secondary | ICD-10-CM | POA: Diagnosis not present

## 2020-04-18 DIAGNOSIS — M25512 Pain in left shoulder: Secondary | ICD-10-CM | POA: Diagnosis not present

## 2020-04-18 DIAGNOSIS — G8929 Other chronic pain: Secondary | ICD-10-CM | POA: Diagnosis not present

## 2020-04-18 DIAGNOSIS — Z79899 Other long term (current) drug therapy: Secondary | ICD-10-CM | POA: Diagnosis not present

## 2020-04-18 DIAGNOSIS — F330 Major depressive disorder, recurrent, mild: Secondary | ICD-10-CM

## 2020-04-18 MED ORDER — HYDROXYZINE HCL 25 MG PO TABS: 25.0000 mg | ORAL_TABLET | Freq: Three times a day (TID) | ORAL | 6 refills | Status: DC | PRN

## 2020-04-23 ENCOUNTER — Other Ambulatory Visit: Payer: Self-pay | Admitting: Family Medicine

## 2020-04-23 DIAGNOSIS — K0381 Cracked tooth: Secondary | ICD-10-CM

## 2020-04-25 DIAGNOSIS — Z48813 Encounter for surgical aftercare following surgery on the respiratory system: Secondary | ICD-10-CM | POA: Diagnosis not present

## 2020-04-25 DIAGNOSIS — Z430 Encounter for attention to tracheostomy: Secondary | ICD-10-CM | POA: Diagnosis not present

## 2020-05-02 DIAGNOSIS — M129 Arthropathy, unspecified: Secondary | ICD-10-CM | POA: Diagnosis not present

## 2020-05-02 DIAGNOSIS — Z79899 Other long term (current) drug therapy: Secondary | ICD-10-CM | POA: Diagnosis not present

## 2020-05-02 DIAGNOSIS — M25511 Pain in right shoulder: Secondary | ICD-10-CM | POA: Diagnosis not present

## 2020-05-02 DIAGNOSIS — M25512 Pain in left shoulder: Secondary | ICD-10-CM | POA: Diagnosis not present

## 2020-05-02 DIAGNOSIS — Z930 Tracheostomy status: Secondary | ICD-10-CM | POA: Diagnosis not present

## 2020-05-02 DIAGNOSIS — E559 Vitamin D deficiency, unspecified: Secondary | ICD-10-CM | POA: Diagnosis not present

## 2020-05-02 DIAGNOSIS — G4730 Sleep apnea, unspecified: Secondary | ICD-10-CM | POA: Diagnosis not present

## 2020-05-08 IMAGING — CT CT ABD-PELV W/ CM
2 of 5 series · 16 of 46 positions shown, 18 images · IV contrast (omnipaque)
Comparison: CT 10/12/2015

CLINICAL DATA: Abdominal pain. Periumbilical pain. Nausea and
vomiting.

EXAM:
CT ABDOMEN AND PELVIS WITH CONTRAST
TECHNIQUE: Multidetector CT imaging of the abdomen and pelvis was performed
using the standard protocol following bolus administration of
intravenous contrast.
CONTRAST:  80mL OMNIPAQUE IOHEXOL 300 MG/ML  SOLN

[Series 3: abd/ pelvis 5.0 i30f 2 · axial · 0.92mm/px · z∈[-555,-170]mm · 13 of 89 slices shown, 15 images]
[im 6/89  soft-tissue]
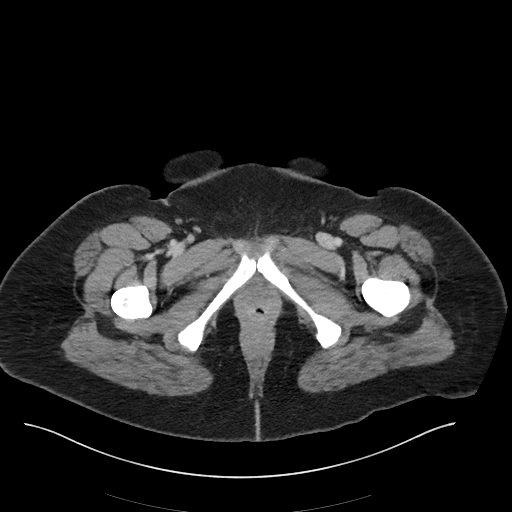
[im 6/89  bone]
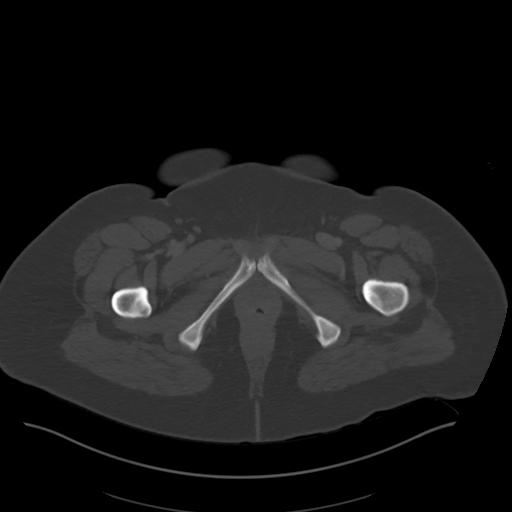
[im 11/89  soft-tissue]
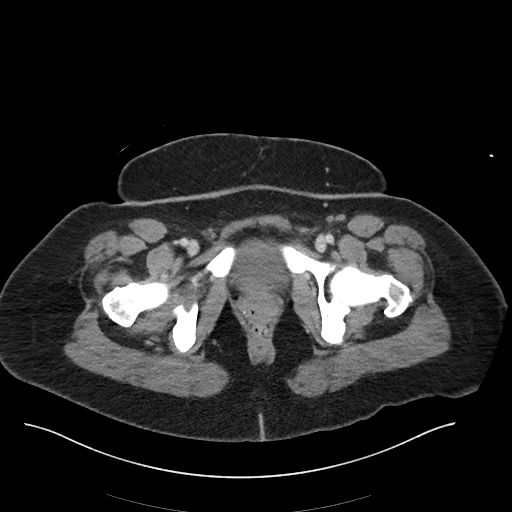
[im 21/89  soft-tissue]
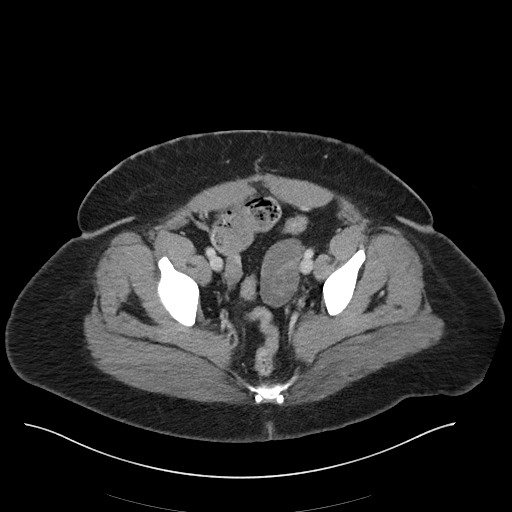
[im 26/89  soft-tissue]
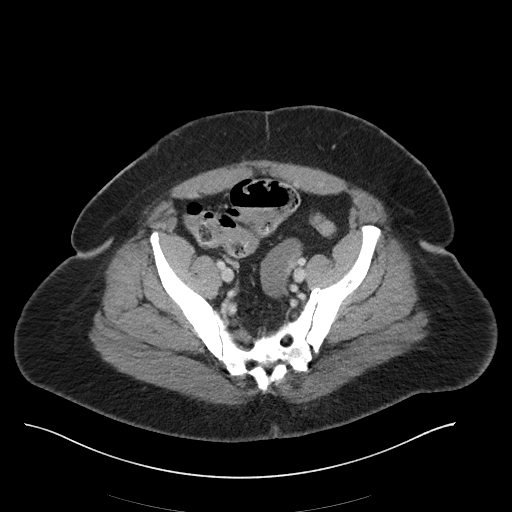
[im 32/89  soft-tissue]
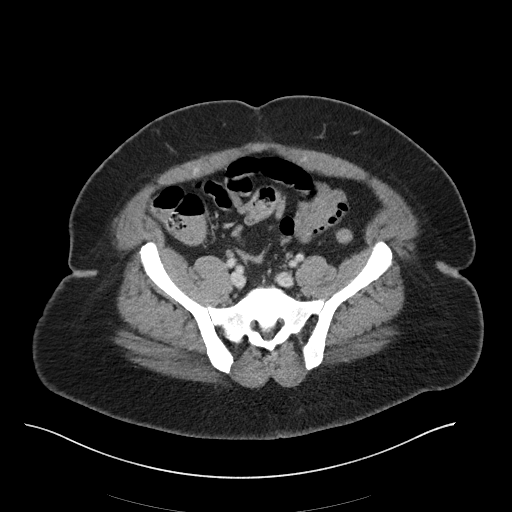
[im 37/89  soft-tissue]
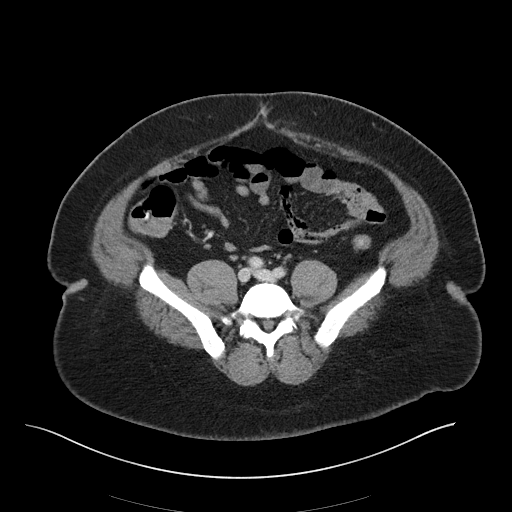
[im 47/89  soft-tissue]
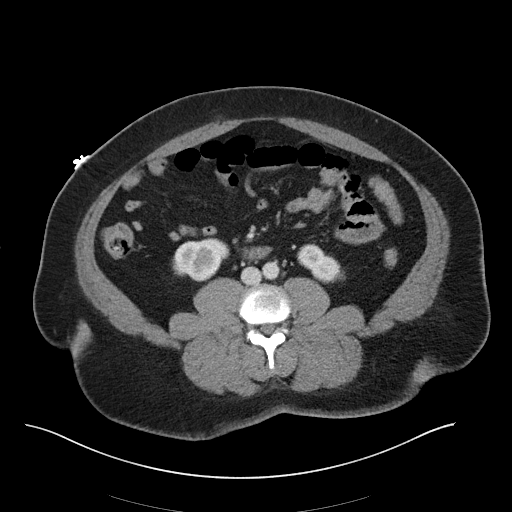
[im 52/89  soft-tissue]
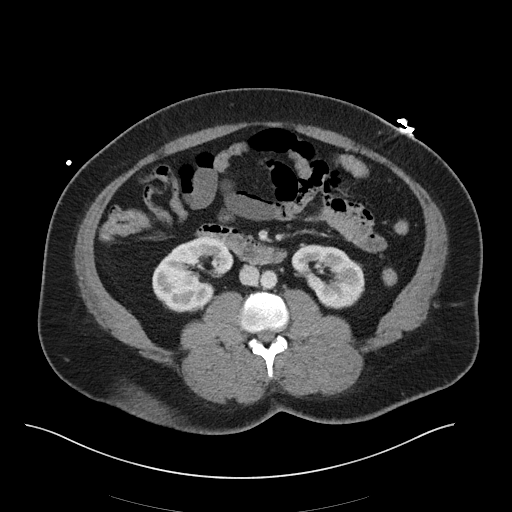
[im 57/89  soft-tissue]
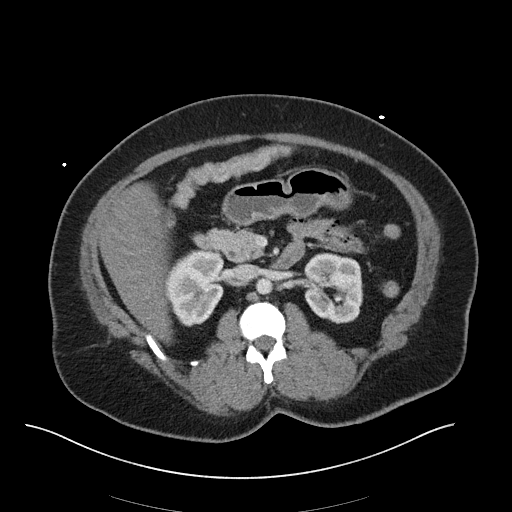
[im 57/89  bone]
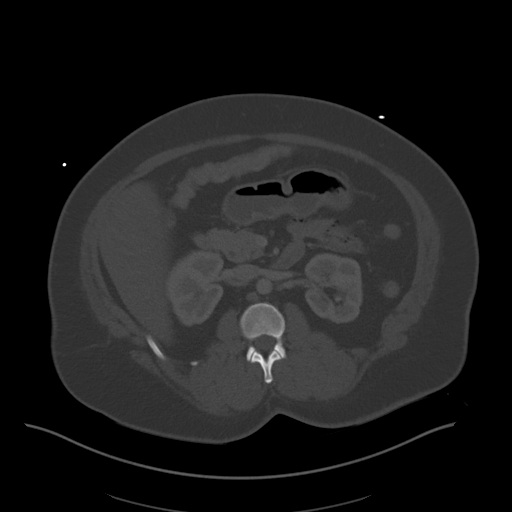
[im 63/89  soft-tissue]
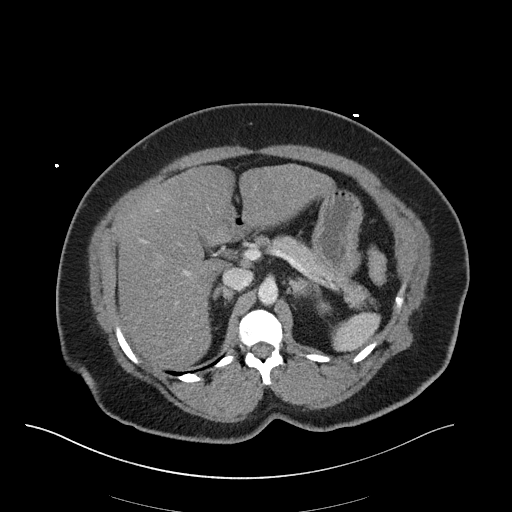
[im 68/89  soft-tissue]
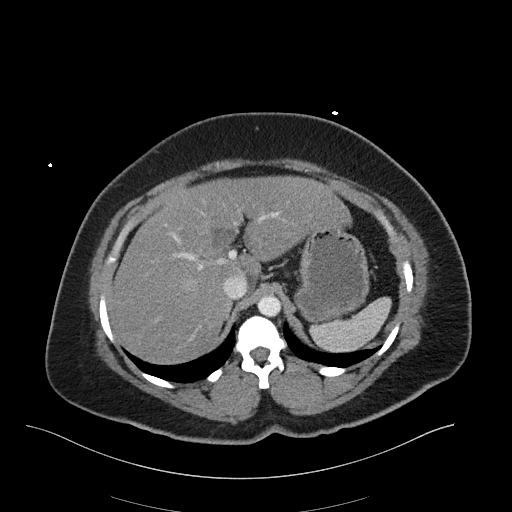
[im 78/89  soft-tissue]
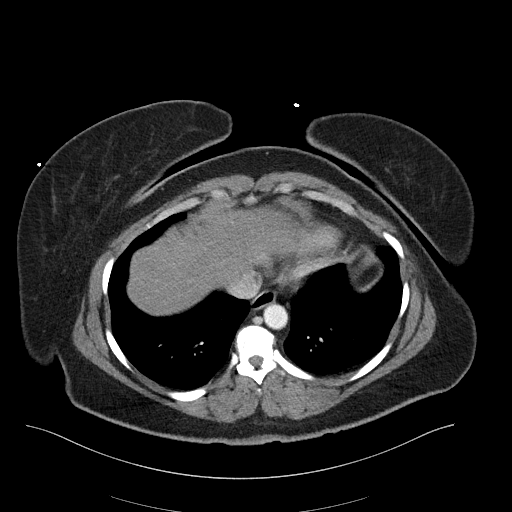
[im 83/89  soft-tissue]
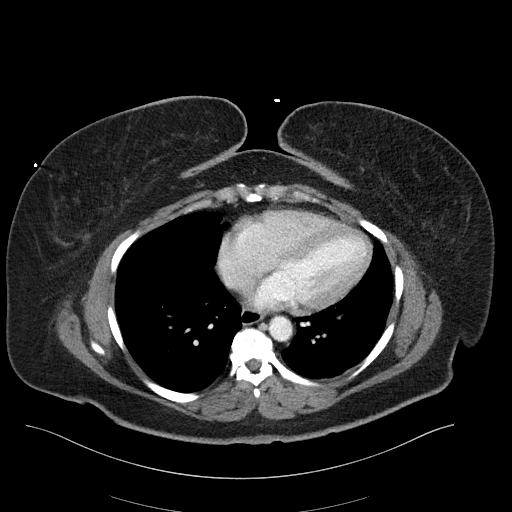

[Series 6: coronal soft tissue · coronal · 0.75mm/px · 3 of 101 slices shown]
[im 34/101  soft-tissue]
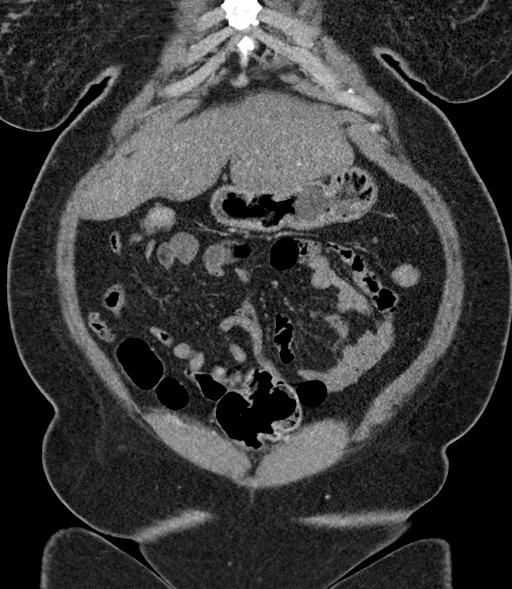
[im 45/101  soft-tissue]
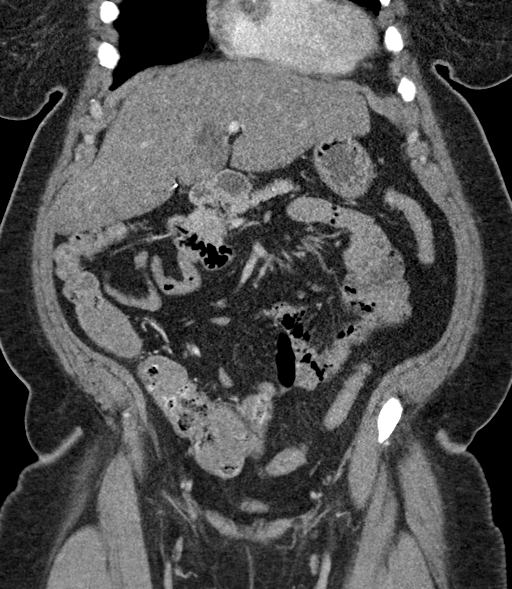
[im 56/101  soft-tissue]
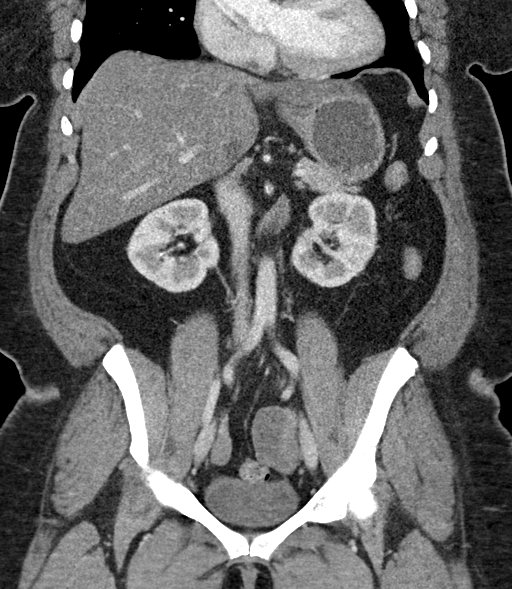

[16 of 46 positions shown; findings below may reference images not displayed]

FINDINGS: Lower chest: Lung bases are clear.

Hepatobiliary: Prominent size liver. Diffusely decreased hepatic
density consistent with steatosis. More focal hypodensity adjacent
to the falciform ligament likely represents geographic fatty
deposition. Postcholecystectomy. No biliary dilatation.

Pancreas: No ductal dilatation or inflammation.

Spleen: Normal in size without focal abnormality.

Adrenals/Urinary Tract: No adrenal nodule. No hydronephrosis or
perinephric edema. Homogeneous renal enhancement with symmetric
excretion on delayed phase imaging. Early excretion into the renal
collecting systems bilaterally. Slight lobulated renal contours.
Urinary bladder is partially distended without wall thickening.

Stomach/Bowel: Stomach is unremarkable, no gastric wall thickening.
No small bowel obstruction or inflammatory change. Cecum is located
in the midline pelvis. Prior appendectomy per history. Small volume
of stool in the proximal colon. The remainder the colon is
decompressed. No colonic wall thickening or inflammation.

Vascular/Lymphatic: Normal caliber abdominal aorta. Portal vein is
patent. No acute vascular findings. No adenopathy.

Reproductive: Post hysterectomy. Multi cystic lesion in the left
adnexa measures 6.5 x 3.9 cm, this may represent a cystic lesion
with septations versus multiple adjacent cysts. Right ovary is
quiescent.

Other: No free air or free fluid.

Musculoskeletal: There are no acute or suspicious osseous
abnormalities.
IMPRESSION: 1. No acute abnormality in the abdomen/pelvis.
2. Multi cystic lesion in the left adnexa measures 6.5 x 3.9 cm, may
represent single septated cystic lesion versus multiple adjacent
cysts. This is new from 2286. Recommend pelvic ultrasound for
further evaluation. This could be performed on an elective basis,
and is unlikely to be related to acute umbilical pain.
3. Hepatic steatosis.

## 2020-05-09 ENCOUNTER — Telehealth: Payer: Self-pay

## 2020-05-09 NOTE — Telephone Encounter (Signed)
Patient has been scheduled for a direct colon at [facility] on 06/12/2020;- however, patient has a tracheostomy (placed in 2018).  Patient has had recent OV with ENT concerning reconstructive surgery and not being a candidate at this time;   Please review chart and advise if patient is appropriate for [place] colonoscopy;  Thank you

## 2020-05-09 NOTE — Telephone Encounter (Signed)
I have never met this patient. Trach is the ultimate airway, but we should check with Mr. Nulty. Thanks  

## 2020-05-10 NOTE — Telephone Encounter (Signed)
[name],  A tracheostomy is a definitive airway and we have small endotracheal tubes should we need to use it for airway access.  This pt is cleared for anesthetic care at [facility].  Thanks,  Osvaldo Angst

## 2020-05-14 NOTE — Progress Notes (Signed)
[name] - 49 y.o. female MRN [id number]  Date of birth: Sep 17, 1971  Office Visit Note: Visit Date: 04/12/2020 PCP: [name], MD Referred by: [name], MD  Subjective: Chief Complaint  Patient presents with  . Lower Back - Pain  . Left Hip - Pain  . Right Hip - Pain   HPI: [name] is a 49 y.o. female who comes in today at the request of Dr. Anderson Malta for planned Right L4-L5 Lumbar epidural steroid injection with fluoroscopic guidance.  The patient has failed conservative care including home exercise, medications, time and activity modification.  This injection will be diagnostic and hopefully therapeutic.  Please see requesting physician notes for further details and justification.  I have seen the patient on a few occasions through Dr. Marlou Sa.  Last injection performed in March was facet joint blocks for mostly axial back pain.  She reports those did not help very much but she did not return at that point for any follow-up.  She has seen Dr. Marlou Sa however and she actually has an MRI update since I have seen her.  This was completed in May.  This does show small disc extrusion at L4-5 actually with some improvement of the disc that was there prior.  Less canal narrowing.  Her case is complicated by obesity.   ROS Otherwise per HPI.  Assessment & Plan: Visit Diagnoses:  1. Radiculopathy due to lumbar intervertebral disc disorder     Plan: No additional findings.   Meds & Orders:  Meds ordered this encounter  Medications  . methylPREDNISolone acetate (DEPO-MEDROL) injection 80 mg    Orders Placed This Encounter  Procedures  . XR C-ARM NO REPORT  . Epidural Steroid injection    Follow-up: Return if symptoms worsen or fail to improve.   Procedures: No procedures performed  Lumbar Epidural Steroid Injection - Interlaminar Approach with Fluoroscopic Guidance  Patient: [name]      Date of Birth:  1971/02/14 MRN: [id number] PCP: [name], MD      Visit Date: 04/12/2020   Universal Protocol:     Consent Given By: the patient  Position: PRONE  Additional Comments: Vital signs were monitored before and after the procedure. Patient was prepped and draped in the usual sterile fashion. The correct patient, procedure, and site was verified.   Injection Procedure Details:  Procedure Site One Meds Administered:  Meds ordered this encounter  Medications  . methylPREDNISolone acetate (DEPO-MEDROL) injection 80 mg     Laterality: Right  Location/Site:  L4-L5  Needle size: 20 G  Needle type: Tuohy  Needle Placement: Paramedian epidural  Findings:   -Comments: Excellent flow of contrast into the epidural space.  Procedure Details: Using a paramedian approach from the side mentioned above, the region overlying the inferior lamina was localized under fluoroscopic visualization and the soft tissues overlying this structure were infiltrated with 4 ml. of 1% Lidocaine without Epinephrine. The Tuohy needle was inserted into the epidural space using a paramedian approach.   The epidural space was localized using loss of resistance along with lateral and bi-planar fluoroscopic views.  After negative aspirate for air, blood, and CSF, a 2 ml. volume of Isovue-250 was injected into the epidural space and the flow of contrast was observed. Radiographs were obtained for documentation purposes.    The injectate was administered into the level noted above.   Additional Comments:  The patient tolerated the procedure well Dressing: 2 x 2 sterile gauze  and Band-Aid    Post-procedure details: Patient was observed during the procedure. Post-procedure instructions were reviewed.  Patient left the clinic in stable condition.    Clinical History: MRI LUMBAR SPINE WITHOUT CONTRAST  TECHNIQUE: Multiplanar, multisequence MR imaging of the lumbar spine was performed. No  intravenous contrast was administered.  COMPARISON:  12/17/2016  FINDINGS: Segmentation:  Standard.  Alignment:  Physiologic.  Vertebrae:  No fracture, evidence of discitis, or bone lesion.  Conus medullaris and cauda equina: Conus extends to the L1 level. Conus and cauda equina appear normal.  Paraspinal and other soft tissues: Epidural lipomatosis  Disc levels:  The disc spaces from T10-L4 are normal.  L4-5: Small central disc extrusion with superior migration to the infrapedicle level. This mildly narrows the left lateral recess. Spinal canal patency is improved compared to the prior study. There is no neural foraminal stenosis.  L5-S1: Normal.  IMPRESSION: Small L4-5 disc extrusion with superior migration, which mildly narrows the left lateral recess. Narrowing of the thecal sac is improved compared to 12/17/2016.   Electronically Signed   By: Ulyses Jarred M.D.   On: 01/30/2020 23:59   She reports that she quit smoking about 3 years ago. Her smoking use included cigarettes. She has a 5.50 pack-year smoking history. She has never used smokeless tobacco.  Recent Labs    05/17/19 1619 07/11/19 1608 03/20/20 1532  HGBA1C 10.1* 7.5* 7.5*    Objective:  VS:  HT:    WT:   BMI:     BP:(!) 137/94  HR:91bpm  TEMP: ( )  RESP:  Physical Exam Constitutional:      General: She is not in acute distress.    Appearance: Normal appearance. She is obese. She is not ill-appearing.  HENT:     Head: Normocephalic and atraumatic.     Right Ear: External ear normal.     Left Ear: External ear normal.  Eyes:     Extraocular Movements: Extraocular movements intact.  Cardiovascular:     Rate and Rhythm: Normal rate.     Pulses: Normal pulses.  Musculoskeletal:     Right lower leg: No edema.     Left lower leg: No edema.     Comments: Patient has good distal strength with no pain over the greater trochanters.  No clonus or focal weakness.  Skin:     Findings: No erythema, lesion or rash.  Neurological:     General: No focal deficit present.     Mental Status: She is alert and oriented to person, place, and time.     Sensory: No sensory deficit.     Motor: No weakness or abnormal muscle tone.     Coordination: Coordination normal.  Psychiatric:        Mood and Affect: Mood normal.        Behavior: Behavior normal.     Ortho Exam  Imaging: No results found.  Past Medical/Family/Surgical/Social History: Medications & Allergies reviewed per EMR, new medications updated. Patient Active Problem List   Diagnosis Date Noted  . Rotator cuff tear 07/28/2019  . Diabetes ([place]) 04/12/2019  . Acute asthma exacerbation 03/22/2019  . Chest pain of uncertain etiology 09/24/7251  . Lactic acidosis 03/22/2019  . Chronic right shoulder pain   . Community acquired pneumonia 10/25/2018  . Asthma, chronic, unspecified asthma severity, with acute exacerbation 10/25/2018  . Acute respiratory failure with hypoxia and hypercapnia ([place]) 10/23/2018  . Acute pain of right shoulder due to trauma 10/05/2018  .  Chronic bilateral low back pain without sciatica 10/05/2018  . Dysphonia 10/05/2018  . Severe asthma with exacerbation 10/05/2018  . Rotator cuff arthropathy, right 06/15/2018  . AKI (acute kidney injury) ([place]) 03/06/2018  . Carpal tunnel syndrome 01/18/2018  . Normocytic anemia 05/06/2017  . Tracheostomy status ([place]) 04/28/2017  . Tracheostomy dependent ([place]) 03/26/2017  . Subglottic stenosis 03/06/2017  . Tracheal stenosis 03/04/2017  . Acute blood loss anemia   . Generalized anxiety disorder   . Hypoalbuminemia due to protein-calorie malnutrition ([place])   . Morbid obesity ([place])   . Chronic pain syndrome   . Gastritis and gastroduodenitis 02/22/2016  . Depression 02/22/2016  . Migraine 01/30/2015  . Bulging lumbar disc 09/05/2013  . Essential hypertension 09/05/2013  . Allergic rhinitis, seasonal 08/11/2012  . Chronic cough  08/11/2012  . Sleep apnea, obstructive 12/04/2011   Past Medical History:  Diagnosis Date  . Anxiety   . Arthritis   . Asthma   . Chronic back pain   . Chronic chest pain    resolved, no problems since 2019 per patient 07/27/19  . COPD (chronic obstructive pulmonary disease) ([place])   . Depression   . DM (diabetes mellitus) ([place])    INSULIN DEPENDENT - TYPE 1  . GERD (gastroesophageal reflux disease)   . Headache(784.0)   . Hyperlipidemia    no med, diet controlled  . Hypertension   . Hypokalemia   . Respiratory disease 05/2017  . Sleep apnea    does not use CPAP due to trach  . Tracheostomy in place [facility]) 02/2017   Family History  Problem Relation Age of Onset  . Heart attack Mother   . Stroke Mother   . Diabetes Mother   . Hypertension Mother   . Arthritis Mother   . Stroke Father   . Hypertension Sister   . Diabetes Sister   . Seizures Brother   . Diabetes Brother    Past Surgical History:  Procedure Laterality Date  . ABDOMINAL HYSTERECTOMY  2009  . APPENDECTOMY    . CESAREAN SECTION     x 3  . CHOLECYSTECTOMY N/A 03/02/2014   Procedure: LAPAROSCOPIC CHOLECYSTECTOMY;  Surgeon: Joyice Faster. Cornett, MD;  Location: [facility];  Service: General;  Laterality: N/A;  . HERNIA REPAIR    . PANENDOSCOPY N/A 03/04/2017   Procedure: PANENDOSCOPY WITH POSSIBLE FOREIGN BODY REMOVAL;  Surgeon: Jodi Marble, MD;  Location: [facility];  Service: ENT;  Laterality: N/A;  . ROTATOR CUFF REPAIR Left   . ROTATOR CUFF REPAIR Right 07/27/2019  . SHOULDER ARTHROSCOPY WITH SUBACROMIAL DECOMPRESSION, ROTATOR CUFF REPAIR AND BICEP TENDON REPAIR Right 07/28/2019   Procedure: RIGHT SHOULDER ARTHROSCOP, MINI OPEN ROTATOR CUFF TEAR REPAIR,  BICEPS TENODESIS, DISTAL CLAVICLE EXCISION;  Surgeon: Meredith Pel, MD;  Location: [facility];  Service: Orthopedics;  Laterality: Right;  . TRACHEOSTOMY  02/2017  . VESICOVAGINAL FISTULA CLOSURE W/ TAH  2009   Social History   Occupational History  . Not on  file  Tobacco Use  . Smoking status: Former Smoker    Packs/day: 0.25    Years: 22.00    Pack years: 5.50    Types: Cigarettes    Quit date: 01/20/2017    Years since quitting: 3.3  . Smokeless tobacco: Never Used  Vaping Use  . Vaping Use: Never used  Substance and Sexual Activity  . Alcohol use: Yes    Alcohol/week: 0.0 standard drinks    Comment: socially 1-2 glasses wine  . Drug use: Yes  Types: Marijuana    Comment: Last Use Jul 13, 2019  . Sexual activity: Yes    Partners: Male    Birth control/protection: None

## 2020-05-14 NOTE — Procedures (Signed)
Lumbar Epidural Steroid Injection - Interlaminar Approach with Fluoroscopic Guidance  Patient: [name]      Date of Birth: [birth year] MRN: [id number] PCP: [name], MD      Visit Date: 04/12/2020   Universal Protocol:     Consent Given By: the patient  Position: PRONE  Additional Comments: Vital signs were monitored before and after the procedure. Patient was prepped and draped in the usual sterile fashion. The correct patient, procedure, and site was verified.   Injection Procedure Details:  Procedure Site One Meds Administered:  Meds ordered this encounter  Medications  . methylPREDNISolone acetate (DEPO-MEDROL) injection 80 mg     Laterality: Right  Location/Site:  L4-L5  Needle size: 20 G  Needle type: Tuohy  Needle Placement: Paramedian epidural  Findings:   -Comments: Excellent flow of contrast into the epidural space.  Procedure Details: Using a paramedian approach from the side mentioned above, the region overlying the inferior lamina was localized under fluoroscopic visualization and the soft tissues overlying this structure were infiltrated with 4 ml. of 1% Lidocaine without Epinephrine. The Tuohy needle was inserted into the epidural space using a paramedian approach.   The epidural space was localized using loss of resistance along with lateral and bi-planar fluoroscopic views.  After negative aspirate for air, blood, and CSF, a 2 ml. volume of Isovue-250 was injected into the epidural space and the flow of contrast was observed. Radiographs were obtained for documentation purposes.    The injectate was administered into the level noted above.   Additional Comments:  The patient tolerated the procedure well Dressing: 2 x 2 sterile gauze and Band-Aid    Post-procedure details: Patient was observed during the procedure. Post-procedure instructions were reviewed.  Patient left the clinic in stable condition.

## 2020-05-18 ENCOUNTER — Ambulatory Visit (AMBULATORY_SURGERY_CENTER): Payer: Medicare Other | Admitting: *Deleted

## 2020-05-18 ENCOUNTER — Other Ambulatory Visit: Payer: Self-pay

## 2020-05-18 VITALS — Ht 61.0 in | Wt 238.8 lb

## 2020-05-18 DIAGNOSIS — Z01818 Encounter for other preprocedural examination: Secondary | ICD-10-CM

## 2020-05-18 DIAGNOSIS — Z1211 Encounter for screening for malignant neoplasm of colon: Secondary | ICD-10-CM

## 2020-05-18 MED ORDER — SUTAB 1479-225-188 MG PO TABS: 1.0000 | ORAL_TABLET | ORAL | 0 refills | Status: DC

## 2020-05-18 NOTE — Progress Notes (Signed)
Patient denies any allergies to egg or soy products. Patient denies complications with anesthesia/sedation.  Patient denies oxygen use at home and denies diet medications. Emmi instructions for colonoscopy explained and sent via my chart.  Patient cleared for Colonoscopy at [facility] (see TE notes).

## 2020-05-24 DIAGNOSIS — Z930 Tracheostomy status: Secondary | ICD-10-CM | POA: Diagnosis not present

## 2020-05-24 DIAGNOSIS — J9610 Chronic respiratory failure, unspecified whether with hypoxia or hypercapnia: Secondary | ICD-10-CM | POA: Diagnosis not present

## 2020-05-24 DIAGNOSIS — G4733 Obstructive sleep apnea (adult) (pediatric): Secondary | ICD-10-CM | POA: Diagnosis not present

## 2020-05-24 DIAGNOSIS — J449 Chronic obstructive pulmonary disease, unspecified: Secondary | ICD-10-CM | POA: Diagnosis not present

## 2020-06-07 ENCOUNTER — Ambulatory Visit (INDEPENDENT_AMBULATORY_CARE_PROVIDER_SITE_OTHER): Payer: Medicare Other

## 2020-06-07 ENCOUNTER — Other Ambulatory Visit: Payer: Self-pay | Admitting: Internal Medicine

## 2020-06-07 DIAGNOSIS — Z930 Tracheostomy status: Secondary | ICD-10-CM | POA: Diagnosis not present

## 2020-06-07 DIAGNOSIS — Z1159 Encounter for screening for other viral diseases: Secondary | ICD-10-CM | POA: Diagnosis not present

## 2020-06-07 LAB — SARS CORONAVIRUS 2 (TAT 6-24 HRS): SARS Coronavirus 2: NEGATIVE

## 2020-06-12 ENCOUNTER — Other Ambulatory Visit: Payer: Self-pay

## 2020-06-12 ENCOUNTER — Encounter: Payer: Self-pay | Admitting: Internal Medicine

## 2020-06-12 ENCOUNTER — Ambulatory Visit (AMBULATORY_SURGERY_CENTER): Payer: Medicare Other | Admitting: Internal Medicine

## 2020-06-12 VITALS — BP 168/112 | HR 87 | Temp 98.8°F | Resp 13 | Ht 61.0 in | Wt 238.8 lb

## 2020-06-12 DIAGNOSIS — K635 Polyp of colon: Secondary | ICD-10-CM | POA: Diagnosis not present

## 2020-06-12 DIAGNOSIS — D125 Benign neoplasm of sigmoid colon: Secondary | ICD-10-CM

## 2020-06-12 DIAGNOSIS — Z1211 Encounter for screening for malignant neoplasm of colon: Secondary | ICD-10-CM | POA: Diagnosis not present

## 2020-06-12 DIAGNOSIS — D120 Benign neoplasm of cecum: Secondary | ICD-10-CM

## 2020-06-12 DIAGNOSIS — D124 Benign neoplasm of descending colon: Secondary | ICD-10-CM

## 2020-06-12 DIAGNOSIS — D123 Benign neoplasm of transverse colon: Secondary | ICD-10-CM

## 2020-06-12 DIAGNOSIS — D122 Benign neoplasm of ascending colon: Secondary | ICD-10-CM | POA: Diagnosis not present

## 2020-06-12 MED ORDER — SODIUM CHLORIDE 0.9 % IV SOLN: 500.0000 mL | Freq: Once | INTRAVENOUS | Status: DC

## 2020-06-12 NOTE — Patient Instructions (Signed)
Multiple polyps removed and sent to pathology.  Diverticulosis. Resume previous medications.  Await pathology for final recommendations.  Handouts on findings given to patient.    YOU HAD AN ENDOSCOPIC PROCEDURE TODAY AT [facility]:   Refer to the procedure report that was given to you for any specific questions about what was found during the examination.  If the procedure report does not answer your questions, please call your gastroenterologist to clarify.  If you requested that your care partner not be given the details of your procedure findings, then the procedure report has been included in a sealed envelope for you to review at your convenience later.  YOU SHOULD EXPECT: Some feelings of bloating in the abdomen. Passage of more gas than usual.  Walking can help get rid of the air that was put into your GI tract during the procedure and reduce the bloating. If you had a lower endoscopy (such as a colonoscopy or flexible sigmoidoscopy) you may notice spotting of blood in your stool or on the toilet paper. If you underwent a bowel prep for your procedure, you may not have a normal bowel movement for a few days.  Please Note:  You might notice some irritation and congestion in your nose or some drainage.  This is from the oxygen used during your procedure.  There is no need for concern and it should clear up in a day or so.  SYMPTOMS TO REPORT IMMEDIATELY:   Following lower endoscopy (colonoscopy or flexible sigmoidoscopy):  Excessive amounts of blood in the stool  Significant tenderness or worsening of abdominal pains  Swelling of the abdomen that is new, acute  Fever of 100F or higher   For urgent or emergent issues, a gastroenterologist can be reached at any hour by calling [phone]. Do not use MyChart messaging for urgent concerns.    DIET:  We do recommend a small meal at first, but then you may proceed to your regular diet.  Drink plenty of fluids but you  should avoid alcoholic beverages for 24 hours.  ACTIVITY:  You should plan to take it easy for the rest of today and you should NOT DRIVE or use heavy machinery until tomorrow (because of the sedation medicines used during the test).    FOLLOW UP: Our staff will call the number listed on your records 48-72 hours following your procedure to check on you and address any questions or concerns that you may have regarding the information given to you following your procedure. If we do not reach you, we will leave a message.  We will attempt to reach you two times.  During this call, we will ask if you have developed any symptoms of COVID 19. If you develop any symptoms (ie: fever, flu-like symptoms, shortness of breath, cough etc.) before then, please call [phone].  If you test positive for Covid 19 in the 2 weeks post procedure, please call and report this information to [place].    If any biopsies were taken you will be contacted by phone or by letter within the next 1-3 weeks.  Please call us at [phone] if you have not heard about the biopsies in 3 weeks.    SIGNATURES/CONFIDENTIALITY: You and/or your care partner have signed paperwork which will be entered into your electronic medical record.  These signatures attest to the fact that that the information above on your After Visit Summary has been reviewed and is understood.  Full responsibility of the confidentiality  of this discharge information lies with you and/or your care-partner.

## 2020-06-12 NOTE — Progress Notes (Signed)
Called to room to assist during endoscopic procedure.  Patient ID and intended procedure confirmed with present staff. Received instructions for my participation in the procedure from the performing physician.  

## 2020-06-12 NOTE — Progress Notes (Signed)
Pt voided approximately 200 cc urine in bedpan.

## 2020-06-12 NOTE — Progress Notes (Signed)
Pt's states no medical or surgical changes since previsit or office visit. 

## 2020-06-12 NOTE — Progress Notes (Signed)
Lidocaine buffer  robinol antisialogogue 

## 2020-06-12 NOTE — Progress Notes (Signed)
VS taken by S.H.

## 2020-06-12 NOTE — Op Note (Signed)
[facility] Patient Name: [name] Procedure Date: 06/12/2020 9:38 AM MRN: [id number] Endoscopist: Docia Chuck. Henrene Pastor , MD Age: 49 Referring MD:  Date of Birth: October 24, 1970 Gender: Female Account #: [id number] Procedure:                Colonoscopy with cold snare polypectomy x 12; hot                            snare x 1 Indications:              Screening for colorectal malignant neoplasm Medicines:                Monitored Anesthesia Care Procedure:                Pre-Anesthesia Assessment:                           - Prior to the procedure, a History and Physical                            was performed, and patient medications and                            allergies were reviewed. The patient's tolerance of                            previous anesthesia was also reviewed. The risks                            and benefits of the procedure and the sedation                            options and risks were discussed with the patient.                            All questions were answered, and informed consent                            was obtained. Prior Anticoagulants: The patient has                            taken no previous anticoagulant or antiplatelet                            agents. ASA Grade Assessment: II - A patient with                            mild systemic disease. After reviewing the risks                            and benefits, the patient was deemed in                            satisfactory condition to undergo the procedure.  After obtaining informed consent, the colonoscope                            was passed under direct vision. Throughout the                            procedure, the patient's blood pressure, pulse, and                            oxygen saturations were monitored continuously. The                            Colonoscope was introduced through the anus and                            advanced  to the the cecum, identified by                            appendiceal orifice and ileocecal valve. The                            ileocecal valve, appendiceal orifice, and rectum                            were photographed. The quality of the bowel                            preparation was excellent. The colonoscopy was                            performed without difficulty. The patient tolerated                            the procedure well. The bowel preparation used was                            SUPREP via split dose instruction. Scope In: 9:51:40 AM Scope Out: 10:16:40 AM Scope Withdrawal Time: 0 hours 21 minutes 37 seconds  Total Procedure Duration: 0 hours 25 minutes 0 seconds  Findings:                 A 15 mm polyp was found in the descending colon.                            The polyp was pedunculated. The polyp was removed                            with a hot snare. Resection and retrieval were                            complete.                           Twelve polyps were found in the sigmoid colon,  descending colon, transverse colon, ascending colon                            and cecum. The polyps were 3 to 10 mm in size.                            These polyps were removed with a cold snare.                            Resection and retrieval were complete.                           A few medium-mouthed diverticula were found in the                            sigmoid colon.                           The exam was otherwise without abnormality on                            direct and retroflexion views. Complications:            No immediate complications. Estimated blood loss:                            None. Estimated Blood Loss:     Estimated blood loss: none. Impression:               - One 15 mm polyp in the descending colon, removed                            with a hot snare. Resected and retrieved.                           - Multiple  (12) 3 to 10 mm polyps in the sigmoid                            colon, in the descending colon, in the transverse                            colon, in the ascending colon and in the cecum,                            removed with a cold snare. Resected and retrieved.                           - Diverticulosis in the sigmoid colon.                           - The examination was otherwise normal on direct                            and retroflexion views.  EXTENDED TIME AND TECHNICAL SERVICE--- MULTIPLE                            POLYPS Recommendation:           - Repeat colonoscopy in 1 year for surveillance.                           - Patient has a contact number available for                            emergencies. The signs and symptoms of potential                            delayed complications were discussed with the                            patient. Return to normal activities tomorrow.                            Written discharge instructions were provided to the                            patient.                           - Resume previous diet.                           - Continue present medications.                           - Await pathology results. Docia Chuck. Henrene Pastor, MD 06/12/2020 10:31:10 AM This report has been signed electronically.

## 2020-06-12 NOTE — Progress Notes (Signed)
A and O x3. Report to RN. Tolerated MAC anesthesia well.

## 2020-06-14 ENCOUNTER — Telehealth: Payer: Self-pay

## 2020-06-14 ENCOUNTER — Other Ambulatory Visit: Payer: Self-pay | Admitting: Family Medicine

## 2020-06-14 DIAGNOSIS — A084 Viral intestinal infection, unspecified: Secondary | ICD-10-CM

## 2020-06-14 DIAGNOSIS — J4530 Mild persistent asthma, uncomplicated: Secondary | ICD-10-CM

## 2020-06-14 NOTE — Telephone Encounter (Signed)
Medication Refill - Medication: albuterol (PROVENTIL) (2.5 MG/3ML) 0.083% nebulizer solution    Has the patient contacted their pharmacy? No. (Agent: If no, request that the patient contact the pharmacy for the refill.) (Agent: If yes, when and what did the pharmacy advise?)  Preferred Pharmacy (with phone number or street name): [facility]  Agent: Please be advised that RX refills may take up to 3 business days. We ask that you follow-up with your pharmacy.

## 2020-06-14 NOTE — Telephone Encounter (Signed)
Requested medication (s) are due for refill today: yes  Requested medication (s) are on the active medication list:yes  Last refill:03/16/20  # 20 0 refills  Future visit scheduled yes 07/11/20  Notes to clinic:not delegated  Requested Prescriptions  Pending Prescriptions Disp Refills   ondansetron (ZOFRAN) 4 MG tablet 20 tablet 0    Sig: Take 1 tablet (4 mg total) by mouth every 8 (eight) hours as needed for nausea or vomiting.      Not Delegated - Gastroenterology: Antiemetics Failed - 06/14/2020  4:28 PM      Failed - This refill cannot be delegated      Passed - Valid encounter within last 6 months    Recent Outpatient Visits           2 months ago Type 1 diabetes mellitus with stage 3a chronic kidney disease ([place])   [place], Charlane Ferretti, MD   8 months ago Viral gastroenteritis   [place], [place], MD   1 year ago Musculoskeletal chest pain   [place], Charlane Ferretti, MD   1 year ago Type 2 diabetes mellitus with diabetic polyneuropathy, without long-term current use of insulin ([place])   [place], Charlane Ferretti, MD   1 year ago Acute pain of right shoulder due to trauma   [place], MD       Future Appointments             In 3 weeks [name], MD [place]

## 2020-06-14 NOTE — Telephone Encounter (Signed)
  Follow up Call-  Call back number 06/12/2020  Post procedure Call Back phone  # [phone]  Permission to leave phone message Yes  Some recent data might be hidden     Patient questions:  Do you have a fever, pain , or abdominal swelling? No. Pain Score  0 *  Have you tolerated food without any problems? Yes.    Have you been able to return to your normal activities? Yes.    Do you have any questions about your discharge instructions: Diet   No. Medications  No. Follow up visit  No.  Do you have questions or concerns about your Care? No.  Actions: * If pain score is 4 or above: 1. No action needed, pain <4.Have you developed a fever since your procedure? no  2.   Have you had an respiratory symptoms (SOB or cough) since your procedure? no  3.   Have you tested positive for COVID 19 since your procedure no  4.   Have you had any family members/close contacts diagnosed with the COVID 19 since your procedure?  no   If yes to any of these questions please route to Joylene John, RN and Joella Prince, RN

## 2020-06-15 MED ORDER — ONDANSETRON HCL 4 MG PO TABS: 4.0000 mg | ORAL_TABLET | Freq: Three times a day (TID) | ORAL | 0 refills | Status: DC | PRN

## 2020-06-17 NOTE — Telephone Encounter (Signed)
Please refill if indicated! 

## 2020-06-18 ENCOUNTER — Encounter: Payer: Self-pay | Admitting: Internal Medicine

## 2020-06-18 MED ORDER — ALBUTEROL SULFATE (2.5 MG/3ML) 0.083% IN NEBU: 2.5000 mg | INHALATION_SOLUTION | Freq: Four times a day (QID) | RESPIRATORY_TRACT | 3 refills | Status: DC | PRN

## 2020-06-18 NOTE — Addendum Note (Signed)
Addended by: Daisy Blossom, Annie Main L on: 06/18/2020 09:25 AM   Modules accepted: Orders

## 2020-06-18 NOTE — Telephone Encounter (Signed)
Refill sent.

## 2020-06-19 DIAGNOSIS — Z79899 Other long term (current) drug therapy: Secondary | ICD-10-CM | POA: Diagnosis not present

## 2020-06-19 DIAGNOSIS — M25511 Pain in right shoulder: Secondary | ICD-10-CM | POA: Diagnosis not present

## 2020-06-19 DIAGNOSIS — G4730 Sleep apnea, unspecified: Secondary | ICD-10-CM | POA: Diagnosis not present

## 2020-06-19 DIAGNOSIS — M25512 Pain in left shoulder: Secondary | ICD-10-CM | POA: Diagnosis not present

## 2020-06-19 DIAGNOSIS — E119 Type 2 diabetes mellitus without complications: Secondary | ICD-10-CM | POA: Diagnosis not present

## 2020-06-20 ENCOUNTER — Ambulatory Visit: Payer: Medicare Other | Admitting: Endocrinology

## 2020-06-25 ENCOUNTER — Ambulatory Visit (INDEPENDENT_AMBULATORY_CARE_PROVIDER_SITE_OTHER): Payer: Medicare Other | Admitting: Endocrinology

## 2020-06-25 ENCOUNTER — Encounter: Payer: Self-pay | Admitting: Endocrinology

## 2020-06-25 ENCOUNTER — Other Ambulatory Visit: Payer: Self-pay

## 2020-06-25 VITALS — BP 110/78 | HR 86 | Ht 61.0 in | Wt 238.6 lb

## 2020-06-25 DIAGNOSIS — N183 Chronic kidney disease, stage 3 unspecified: Secondary | ICD-10-CM | POA: Diagnosis not present

## 2020-06-25 DIAGNOSIS — E1022 Type 1 diabetes mellitus with diabetic chronic kidney disease: Secondary | ICD-10-CM | POA: Diagnosis not present

## 2020-06-25 DIAGNOSIS — N1831 Chronic kidney disease, stage 3a: Secondary | ICD-10-CM

## 2020-06-25 DIAGNOSIS — Z930 Tracheostomy status: Secondary | ICD-10-CM | POA: Diagnosis not present

## 2020-06-25 LAB — POCT GLYCOSYLATED HEMOGLOBIN (HGB A1C): Hemoglobin A1C: 7.9 % — AB (ref 4.0–5.6)

## 2020-06-25 NOTE — Patient Instructions (Addendum)
Please continue the same medications for diabetes. check your blood sugar twice a day.  vary the time of day when you check, between before the 3 meals, and at bedtime.  also check if you have symptoms of your blood sugar being too high or too low.  please keep a record of the readings and bring it to your next appointment here (or you can bring the meter itself).  You can write it on any piece of paper.  please call us sooner if your blood sugar goes below 70, or if you have a lot of readings over 200. Please come back for a follow-up appointment in 4 months.   

## 2020-06-25 NOTE — Progress Notes (Signed)
Subjective:    Patient ID: [name], female    DOB: 1971/03/23, 49 y.o.   MRN: [id number]  HPI Pt returns for f/u of diabetes mellitus: DM type: 1 Dx'ed: 2297 Complications: stage 3 CRI and PN Therapy: insulin since 2012, and Ozempic.   GDM: never DKA: once, in 2020 Severe hypoglycemia: never.   Pancreatitis: never.   Other: she declines weight loss surgery; edema precludes pioglitizone rx; she did not tolerate metformin (diarrhea); she is not a candidate for multiple daily injections, due to missing insulin doses.    Interval history: Pt says she seldom misses the insulin. Pt states cbg varies from 60-280.   It is in general higher as the day goes on.  pt states she feels well in general. Past Medical History:  Diagnosis Date  . Allergy   . Anxiety   . Arthritis   . Asthma   . Chronic back pain   . Chronic chest pain    resolved, no problems since 2019 per patient 07/27/19  . COPD (chronic obstructive pulmonary disease) ([place])   . Depression   . DM (diabetes mellitus) ([place])    INSULIN DEPENDENT - TYPE 1  . GERD (gastroesophageal reflux disease)   . Headache(784.0)   . Hyperlipidemia    no med, diet controlled  . Hypertension   . Hypokalemia   . Respiratory disease 05/2017   uses inhalers, neb tx prn, no oxygen  . Sleep apnea    does not use CPAP due to trach  . Tracheostomy in place [facility]) 02/2017    Past Surgical History:  Procedure Laterality Date  . ABDOMINAL HYSTERECTOMY  2009  . APPENDECTOMY    . CESAREAN SECTION     x 3  . CHOLECYSTECTOMY N/A 03/02/2014   Procedure: LAPAROSCOPIC CHOLECYSTECTOMY;  Surgeon: Joyice Faster. [place], MD;  Location: [place];  Service: General;  Laterality: N/A;  . HERNIA REPAIR    . PANENDOSCOPY N/A 03/04/2017   Procedure: PANENDOSCOPY WITH POSSIBLE FOREIGN BODY REMOVAL;  Surgeon: Jodi Marble, MD;  Location: [place];  Service: ENT;  Laterality: N/A;  . ROTATOR CUFF REPAIR Left   . ROTATOR CUFF REPAIR Right 07/27/2019   . SHOULDER ARTHROSCOPY WITH SUBACROMIAL DECOMPRESSION, ROTATOR CUFF REPAIR AND BICEP TENDON REPAIR Right 07/28/2019   Procedure: RIGHT SHOULDER ARTHROSCOP, MINI OPEN ROTATOR CUFF TEAR REPAIR,  BICEPS TENODESIS, DISTAL CLAVICLE EXCISION;  Surgeon: Meredith Pel, MD;  Location: [place];  Service: Orthopedics;  Laterality: Right;  . TRACHEOSTOMY  02/2017  . VESICOVAGINAL FISTULA CLOSURE W/ TAH  2009    Social History   Socioeconomic History  . Marital status: Married    Spouse name: Not on file  . Number of children: Not on file  . Years of education: Not on file  . Highest education level: Not on file  Occupational History  . Not on file  Tobacco Use  . Smoking status: Former Smoker    Packs/day: 0.25    Years: 22.00    Pack years: 5.50    Types: Cigarettes    Quit date: 01/20/2017    Years since quitting: 3.4  . Smokeless tobacco: Never Used  Vaping Use  . Vaping Use: Never used  Substance and Sexual Activity  . Alcohol use: Yes    Alcohol/week: 0.0 standard drinks    Comment: social wine  . Drug use: Yes    Types: Marijuana    Comment: Last Use In July 2021  . Sexual activity: Yes  Partners: Male    Birth control/protection: Surgical    Comment: Hysterectomy  Other Topics Concern  . Not on file  Social History Narrative  . Not on file   Social Determinants of Health   Financial Resource Strain:   . Difficulty of Paying Living Expenses: Not on file  Food Insecurity:   . Worried About Charity fundraiser in the Last Year: Not on file  . Ran Out of Food in the Last Year: Not on file  Transportation Needs:   . Lack of Transportation (Medical): Not on file  . Lack of Transportation (Non-Medical): Not on file  Physical Activity:   . Days of Exercise per Week: Not on file  . Minutes of Exercise per Session: Not on file  Stress:   . Feeling of Stress : Not on file  Social Connections:   . Frequency of Communication with Friends and Family: Not on file  .  Frequency of Social Gatherings with Friends and Family: Not on file  . Attends Religious Services: Not on file  . Active Member of Clubs or Organizations: Not on file  . Attends Archivist Meetings: Not on file  . Marital Status: Not on file  Intimate Partner Violence:   . Fear of Current or Ex-Partner: Not on file  . Emotionally Abused: Not on file  . Physically Abused: Not on file  . Sexually Abused: Not on file    Current Outpatient Medications on File Prior to Visit  Medication Sig Dispense Refill  . acetaminophen (TYLENOL) 325 MG tablet Take 1-2 tablets (325-650 mg total) by mouth every 6 (six) hours as needed for mild pain (pain score 1-3 or temp > 100.5). 60 tablet 0  . albuterol (PROAIR HFA) 108 (90 Base) MCG/ACT inhaler Inhale 2 puffs into the lungs every 6 (six) hours as needed for wheezing or shortness of breath. 18 g 6  . albuterol (PROVENTIL) (2.5 MG/3ML) 0.083% nebulizer solution Take 3 mLs (2.5 mg total) by nebulization every 6 (six) hours as needed for wheezing or shortness of breath. 75 mL 3  . amLODipine (NORVASC) 10 MG tablet Take 1 tablet (10 mg total) by mouth daily. 90 tablet 1  . aspirin EC 81 MG tablet Take 1 tablet (81 mg total) by mouth daily. 14 tablet 0  . atorvastatin (LIPITOR) 20 MG tablet Take 1 tablet (20 mg total) by mouth daily. 90 tablet 1  . Blood Glucose Monitoring Suppl (ONETOUCH VERIO) w/Device KIT 1 each by Does not apply route daily. Use to monitor glucose levels daily; E11.9 1 kit 0  . budesonide-formoterol (SYMBICORT) 160-4.5 MCG/ACT inhaler Inhale 2 puffs into the lungs 2 (two) times daily. 3 Inhaler 6  . buPROPion (WELLBUTRIN SR) 150 MG 12 hr tablet Take 1 tablet (150 mg total) by mouth 2 (two) times daily. 60 tablet 6  . carvedilol (COREG) 25 MG tablet Take 1 tablet (25 mg total) by mouth 2 (two) times daily with a meal. 180 tablet 1  . clotrimazole-betamethasone (LOTRISONE) cream Apply 1 application topically 3 (three) times daily as  needed. For rash 45 g 2  . furosemide (LASIX) 20 MG tablet Take 1 tablet (20 mg total) by mouth daily. 30 tablet 2  . gabapentin (NEURONTIN) 300 MG capsule Take 2 capsules (600 mg total) by mouth 2 (two) times daily. 360 capsule 1  . glucose blood (ONETOUCH VERIO) test strip Use to monitor glucose levels daily; E11.9 100 each 2  . HYDROcodone-acetaminophen (NORCO/VICODIN) 5-325 MG tablet  Take 1 tablet by mouth daily as needed for moderate pain. 20 tablet 0  . hydrOXYzine (ATARAX/VISTARIL) 25 MG tablet Take 1 tablet (25 mg total) by mouth 3 (three) times daily as needed for anxiety. 60 tablet 6  . Insulin Degludec (TRESIBA FLEXTOUCH) 200 UNIT/ML SOPN Inject 140 Units into the skin daily. 12 pen 11  . Insulin Pen Needle (FIFTY50 PEN NEEDLES) 31G X 8 MM MISC Inject 1 Units as directed daily.    Marland Kitchen lisinopril (ZESTRIL) 20 MG tablet Take 1 tablet (20 mg total) by mouth daily. 90 tablet 1  . Melatonin 3 MG TABS Take 3 mg by mouth at bedtime as needed (for sleep).     . montelukast (SINGULAIR) 10 MG tablet Take 1 tablet (10 mg total) by mouth at bedtime. 90 tablet 1  . NARCAN 4 MG/0.1ML LIQD nasal spray kit SMARTSIG:1 Both Nares PRN    . ondansetron (ZOFRAN) 4 MG tablet Take 1 tablet (4 mg total) by mouth every 8 (eight) hours as needed for nausea or vomiting. 20 tablet 0  . OneTouch Delica Lancets 40J MISC 1 each by Does not apply route daily. Use to monitor glucose levels daily; E11.9 100 each 2  . oxyCODONE-acetaminophen (PERCOCET) 10-325 MG tablet Take 1 tablet by mouth 4 (four) times daily as needed.    . pantoprazole (PROTONIX) 20 MG tablet Take 1 tablet (20 mg total) by mouth daily. 30 tablet 0  . Semaglutide, 1 MG/DOSE, (OZEMPIC, 1 MG/DOSE,) 2 MG/1.5ML SOPN Inject 1 mg into the skin once a week. 2 pen 11  . sertraline (ZOLOFT) 100 MG tablet Take 1 tablet (100 mg total) by mouth at bedtime. 90 tablet 1  . SUMAtriptan (IMITREX) 100 MG tablet At the onset of a migraine; may repeat in 2 hours. Max  daily dose 263m 20 tablet 1  . tiZANidine (ZANAFLEX) 4 MG tablet Take 1 tablet (4 mg total) by mouth every 8 (eight) hours as needed for muscle spasms. 90 tablet 3  . topiramate (TOPAMAX) 100 MG tablet Take 1 tablet (100 mg total) by mouth 2 (two) times daily. 60 tablet 3  . [DISCONTINUED] fluticasone-salmeterol (ADVAIR HFA) 230-21 MCG/ACT inhaler Inhale 2 puffs into the lungs 2 (two) times daily. 1 Inhaler 3   Current Facility-Administered Medications on File Prior to Visit  Medication Dose Route Frequency Provider Last Rate Last Admin  . 0.9 %  sodium chloride infusion  500 mL Intravenous Once PIrene Shipper MD        Allergies  Allergen Reactions  . Reglan [Metoclopramide] Other (See Comments)    Panic attack    Family History  Problem Relation Age of Onset  . Heart attack Mother   . Stroke Mother   . Diabetes Mother   . Hypertension Mother   . Arthritis Mother   . Stroke Father   . Hypertension Sister   . Diabetes Sister   . Seizures Brother   . Diabetes Brother   . Colon cancer Neg Hx   . Rectal cancer Neg Hx   . Stomach cancer Neg Hx     BP 110/78   Pulse 86   Ht 5' 1"  (1.549 m)   Wt 238 lb 9.6 oz (108.2 kg)   LMP  (LMP Unknown)   BMI 45.08 kg/m    Review of Systems Denies LOC.      Objective:   Physical Exam VITAL SIGNS:  See vs page GENERAL: no distress Pulses: dorsalis pedis intact bilat.   MSK:  no deformity of the feet CV: no leg edema Skin:  no ulcer on the feet.  normal color and temp on the feet. Neuro: sensation is intact to touch on the feet  Lab Results  Component Value Date   HGBA1C 7.9 (A) 06/25/2020   Lab Results  Component Value Date   CREATININE 1.10 (H) 04/10/2020   BUN 15 04/10/2020   NA 142 04/10/2020   K 4.1 04/10/2020   CL 106 04/10/2020   CO2 22 04/10/2020       Assessment & Plan:  Type 1 DM, with CRI.   Hypoglycemia, due to insulin: this limits aggressiveness of glycemic control  Patient Instructions  Please  continue the same medications for diabetes.  check your blood sugar twice a day.  vary the time of day when you check, between before the 3 meals, and at bedtime.  also check if you have symptoms of your blood sugar being too high or too low.  please keep a record of the readings and bring it to your next appointment here (or you can bring the meter itself).  You can write it on any piece of paper.  please call us sooner if your blood sugar goes below 70, or if you have a lot of readings over 200. Please come back for a follow-up appointment in 4 months.

## 2020-07-11 ENCOUNTER — Other Ambulatory Visit: Payer: Self-pay

## 2020-07-11 ENCOUNTER — Ambulatory Visit: Payer: Medicare Other | Attending: Family Medicine | Admitting: Family Medicine

## 2020-07-11 VITALS — BP 136/82 | HR 111 | Ht 61.0 in | Wt 235.0 lb

## 2020-07-11 DIAGNOSIS — A084 Viral intestinal infection, unspecified: Secondary | ICD-10-CM

## 2020-07-11 DIAGNOSIS — G43119 Migraine with aura, intractable, without status migrainosus: Secondary | ICD-10-CM

## 2020-07-11 DIAGNOSIS — E785 Hyperlipidemia, unspecified: Secondary | ICD-10-CM

## 2020-07-11 DIAGNOSIS — E1021 Type 1 diabetes mellitus with diabetic nephropathy: Secondary | ICD-10-CM | POA: Diagnosis not present

## 2020-07-11 DIAGNOSIS — M5126 Other intervertebral disc displacement, lumbar region: Secondary | ICD-10-CM

## 2020-07-11 DIAGNOSIS — E1069 Type 1 diabetes mellitus with other specified complication: Secondary | ICD-10-CM

## 2020-07-11 DIAGNOSIS — I10 Essential (primary) hypertension: Secondary | ICD-10-CM | POA: Diagnosis not present

## 2020-07-11 DIAGNOSIS — N1831 Chronic kidney disease, stage 3a: Secondary | ICD-10-CM

## 2020-07-11 DIAGNOSIS — M5136 Other intervertebral disc degeneration, lumbar region: Secondary | ICD-10-CM

## 2020-07-11 DIAGNOSIS — J4540 Moderate persistent asthma, uncomplicated: Secondary | ICD-10-CM

## 2020-07-11 DIAGNOSIS — R110 Nausea: Secondary | ICD-10-CM

## 2020-07-11 DIAGNOSIS — K219 Gastro-esophageal reflux disease without esophagitis: Secondary | ICD-10-CM

## 2020-07-11 DIAGNOSIS — F330 Major depressive disorder, recurrent, mild: Secondary | ICD-10-CM

## 2020-07-11 DIAGNOSIS — Z1231 Encounter for screening mammogram for malignant neoplasm of breast: Secondary | ICD-10-CM

## 2020-07-11 LAB — GLUCOSE, POCT (MANUAL RESULT ENTRY): POC Glucose: 189 mg/dl — AB (ref 70–99)

## 2020-07-11 MED ORDER — LISINOPRIL 20 MG PO TABS: 20.0000 mg | ORAL_TABLET | Freq: Every day | ORAL | 1 refills | Status: DC

## 2020-07-11 MED ORDER — SERTRALINE HCL 100 MG PO TABS: 100.0000 mg | ORAL_TABLET | Freq: Every day | ORAL | 1 refills | Status: DC

## 2020-07-11 MED ORDER — ATORVASTATIN CALCIUM 20 MG PO TABS: 20.0000 mg | ORAL_TABLET | Freq: Every day | ORAL | 1 refills | Status: DC

## 2020-07-11 MED ORDER — MONTELUKAST SODIUM 10 MG PO TABS: 10.0000 mg | ORAL_TABLET | Freq: Every day | ORAL | 1 refills | Status: DC

## 2020-07-11 MED ORDER — TOPIRAMATE 100 MG PO TABS: 100.0000 mg | ORAL_TABLET | Freq: Two times a day (BID) | ORAL | 1 refills | Status: DC

## 2020-07-11 MED ORDER — TIZANIDINE HCL 4 MG PO TABS: 4.0000 mg | ORAL_TABLET | Freq: Three times a day (TID) | ORAL | 6 refills | Status: DC | PRN

## 2020-07-11 MED ORDER — MISC. DEVICES MISC: 0 refills | Status: DC

## 2020-07-11 MED ORDER — DULERA 200-5 MCG/ACT IN AERO: 2.0000 | INHALATION_SPRAY | Freq: Two times a day (BID) | RESPIRATORY_TRACT | 6 refills | Status: DC

## 2020-07-11 MED ORDER — ALBUTEROL SULFATE HFA 108 (90 BASE) MCG/ACT IN AERS: 2.0000 | INHALATION_SPRAY | Freq: Four times a day (QID) | RESPIRATORY_TRACT | 6 refills | Status: DC | PRN

## 2020-07-11 MED ORDER — HYDROXYZINE HCL 25 MG PO TABS: 25.0000 mg | ORAL_TABLET | Freq: Three times a day (TID) | ORAL | 1 refills | Status: DC | PRN

## 2020-07-11 MED ORDER — AMLODIPINE BESYLATE 10 MG PO TABS: 10.0000 mg | ORAL_TABLET | Freq: Every day | ORAL | 1 refills | Status: DC

## 2020-07-11 MED ORDER — PANTOPRAZOLE SODIUM 20 MG PO TBEC: 20.0000 mg | DELAYED_RELEASE_TABLET | Freq: Every day | ORAL | 0 refills | Status: DC

## 2020-07-11 MED ORDER — CARVEDILOL 25 MG PO TABS: 25.0000 mg | ORAL_TABLET | Freq: Two times a day (BID) | ORAL | 1 refills | Status: DC

## 2020-07-11 MED ORDER — BUPROPION HCL ER (SR) 150 MG PO TB12: 150.0000 mg | ORAL_TABLET | Freq: Two times a day (BID) | ORAL | 1 refills | Status: DC

## 2020-07-11 MED ORDER — ONDANSETRON HCL 4 MG PO TABS: 4.0000 mg | ORAL_TABLET | Freq: Three times a day (TID) | ORAL | 0 refills | Status: DC | PRN

## 2020-07-11 MED ORDER — SUMATRIPTAN SUCCINATE 100 MG PO TABS: ORAL_TABLET | ORAL | 6 refills | Status: DC

## 2020-07-11 NOTE — Progress Notes (Signed)
Subjective:  Patient ID: [name], female    DOB: Jul 19, 1971  Age: 49 y.o. MRN: [id number]  CC: Hypertension   [name]  is a49 year old female with a history of type 1 diabetes mellitus (A1c7.9), hypertension, obstructive sleep apnea, asthma, depression, s/p tracheostomy secondary to intubation related tracheal injury, subglottic stenosis (status post balloon dilatation and Kenalog injection of stenosis tracheostomy tube exchange by ENT,status post right rotator cuff surgery in 07/2019 seen for follow-up visit.  Seen by her Endocrinologist 2 weeks ago with no regimen change for her diabetes per notes.  She does not exercise. 6 days ago her R leg gave out and she fell on her R side and now has pain in her right arm, right side, right hip.  Currently taking the oxycodone which she receives from the pain clinic at [facility]. For her Asthma she has been using her MDI more; needs a new nebulizer machine.  Endorses compliance with her Symbicort and Singulair. Her reflux has been flaring up and she has no PPI for her symptoms.  With regards to her trach she is doing well with trach changes by ENT every 3 months. Migraines are controlled and she is doing well on her antihypertensive. Past Medical History:  Diagnosis Date  . Allergy   . Anxiety   . Arthritis   . Asthma   . Chronic back pain   . Chronic chest pain    resolved, no problems since 2019 per patient 07/27/19  . COPD (chronic obstructive pulmonary disease) ([place])   . Depression   . DM (diabetes mellitus) ([place])    INSULIN DEPENDENT - TYPE 1  . GERD (gastroesophageal reflux disease)   . Headache(784.0)   . Hyperlipidemia    no med, diet controlled  . Hypertension   . Hypokalemia   . Respiratory disease 05/2017   uses inhalers, neb tx prn, no oxygen  . Sleep apnea    does not use CPAP due to trach  . Tracheostomy in place [facility]) 02/2017    Past Surgical History:  Procedure  Laterality Date  . ABDOMINAL HYSTERECTOMY  2009  . APPENDECTOMY    . CESAREAN SECTION     x 3  . CHOLECYSTECTOMY N/A 03/02/2014   Procedure: LAPAROSCOPIC CHOLECYSTECTOMY;  Surgeon: Joyice Faster. Cornett, MD;  Location: [place];  Service: General;  Laterality: N/A;  . HERNIA REPAIR    . PANENDOSCOPY N/A 03/04/2017   Procedure: PANENDOSCOPY WITH POSSIBLE FOREIGN BODY REMOVAL;  Surgeon: Jodi Marble, MD;  Location: [place];  Service: ENT;  Laterality: N/A;  . ROTATOR CUFF REPAIR Left   . ROTATOR CUFF REPAIR Right 07/27/2019  . SHOULDER ARTHROSCOPY WITH SUBACROMIAL DECOMPRESSION, ROTATOR CUFF REPAIR AND BICEP TENDON REPAIR Right 07/28/2019   Procedure: RIGHT SHOULDER ARTHROSCOP, MINI OPEN ROTATOR CUFF TEAR REPAIR,  BICEPS TENODESIS, DISTAL CLAVICLE EXCISION;  Surgeon: Meredith Pel, MD;  Location: [place];  Service: Orthopedics;  Laterality: Right;  . TRACHEOSTOMY  02/2017  . VESICOVAGINAL FISTULA CLOSURE W/ TAH  2009    Family History  Problem Relation Age of Onset  . Heart attack Mother   . Stroke Mother   . Diabetes Mother   . Hypertension Mother   . Arthritis Mother   . Stroke Father   . Hypertension Sister   . Diabetes Sister   . Seizures Brother   . Diabetes Brother   . Colon cancer Neg Hx   . Rectal cancer Neg Hx   . Stomach cancer Neg  Hx     Allergies  Allergen Reactions  . Reglan [Metoclopramide] Other (See Comments)    Panic attack    Outpatient Medications Prior to Visit  Medication Sig Dispense Refill  . acetaminophen (TYLENOL) 325 MG tablet Take 1-2 tablets (325-650 mg total) by mouth every 6 (six) hours as needed for mild pain (pain score 1-3 or temp > 100.5). 60 tablet 0  . albuterol (PROAIR HFA) 108 (90 Base) MCG/ACT inhaler Inhale 2 puffs into the lungs every 6 (six) hours as needed for wheezing or shortness of breath. 18 g 6  . albuterol (PROVENTIL) (2.5 MG/3ML) 0.083% nebulizer solution Take 3 mLs (2.5 mg total) by nebulization every 6 (six) hours as needed for  wheezing or shortness of breath. 75 mL 3  . amLODipine (NORVASC) 10 MG tablet Take 1 tablet (10 mg total) by mouth daily. 90 tablet 1  . aspirin EC 81 MG tablet Take 1 tablet (81 mg total) by mouth daily. 14 tablet 0  . atorvastatin (LIPITOR) 20 MG tablet Take 1 tablet (20 mg total) by mouth daily. 90 tablet 1  . Blood Glucose Monitoring Suppl (ONETOUCH VERIO) w/Device KIT 1 each by Does not apply route daily. Use to monitor glucose levels daily; E11.9 1 kit 0  . budesonide-formoterol (SYMBICORT) 160-4.5 MCG/ACT inhaler Inhale 2 puffs into the lungs 2 (two) times daily. 3 Inhaler 6  . buPROPion (WELLBUTRIN SR) 150 MG 12 hr tablet Take 1 tablet (150 mg total) by mouth 2 (two) times daily. 60 tablet 6  . carvedilol (COREG) 25 MG tablet Take 1 tablet (25 mg total) by mouth 2 (two) times daily with a meal. 180 tablet 1  . clotrimazole-betamethasone (LOTRISONE) cream Apply 1 application topically 3 (three) times daily as needed. For rash 45 g 2  . furosemide (LASIX) 20 MG tablet Take 1 tablet (20 mg total) by mouth daily. 30 tablet 2  . gabapentin (NEURONTIN) 300 MG capsule Take 2 capsules (600 mg total) by mouth 2 (two) times daily. 360 capsule 1  . glucose blood (ONETOUCH VERIO) test strip Use to monitor glucose levels daily; E11.9 100 each 2  . hydrOXYzine (ATARAX/VISTARIL) 25 MG tablet Take 1 tablet (25 mg total) by mouth 3 (three) times daily as needed for anxiety. 60 tablet 6  . Insulin Degludec (TRESIBA FLEXTOUCH) 200 UNIT/ML SOPN Inject 140 Units into the skin daily. 12 pen 11  . Insulin Pen Needle (FIFTY50 PEN NEEDLES) 31G X 8 MM MISC Inject 1 Units as directed daily.    Marland Kitchen lisinopril (ZESTRIL) 20 MG tablet Take 1 tablet (20 mg total) by mouth daily. 90 tablet 1  . Melatonin 3 MG TABS Take 3 mg by mouth at bedtime as needed (for sleep).     . montelukast (SINGULAIR) 10 MG tablet Take 1 tablet (10 mg total) by mouth at bedtime. 90 tablet 1  . NARCAN 4 MG/0.1ML LIQD nasal spray kit SMARTSIG:1 Both  Nares PRN    . ondansetron (ZOFRAN) 4 MG tablet Take 1 tablet (4 mg total) by mouth every 8 (eight) hours as needed for nausea or vomiting. 20 tablet 0  . OneTouch Delica Lancets 06O MISC 1 each by Does not apply route daily. Use to monitor glucose levels daily; E11.9 100 each 2  . oxyCODONE-acetaminophen (PERCOCET) 10-325 MG tablet Take 1 tablet by mouth 4 (four) times daily as needed.    . pantoprazole (PROTONIX) 20 MG tablet Take 1 tablet (20 mg total) by mouth daily. 30 tablet 0  .  Semaglutide, 1 MG/DOSE, (OZEMPIC, 1 MG/DOSE,) 2 MG/1.5ML SOPN Inject 1 mg into the skin once a week. 2 pen 11  . sertraline (ZOLOFT) 100 MG tablet Take 1 tablet (100 mg total) by mouth at bedtime. 90 tablet 1  . SUMAtriptan (IMITREX) 100 MG tablet At the onset of a migraine; may repeat in 2 hours. Max daily dose 261m 20 tablet 1  . tiZANidine (ZANAFLEX) 4 MG tablet Take 1 tablet (4 mg total) by mouth every 8 (eight) hours as needed for muscle spasms. 90 tablet 3  . topiramate (TOPAMAX) 100 MG tablet Take 1 tablet (100 mg total) by mouth 2 (two) times daily. 60 tablet 3  . HYDROcodone-acetaminophen (NORCO/VICODIN) 5-325 MG tablet Take 1 tablet by mouth daily as needed for moderate pain. (Patient not taking: Reported on 07/11/2020) 20 tablet 0   Facility-Administered Medications Prior to Visit  Medication Dose Route Frequency Provider Last Rate Last Admin  . 0.9 %  sodium chloride infusion  500 mL Intravenous Once PIrene Shipper MD         ROS Review of Systems  Constitutional: Negative for activity change, appetite change and fatigue.  HENT: Negative for congestion, sinus pressure and sore throat.   Eyes: Negative for visual disturbance.  Respiratory: Negative for cough, chest tightness, shortness of breath and wheezing.   Cardiovascular: Negative for chest pain and palpitations.  Gastrointestinal: Negative for abdominal distention, abdominal pain and constipation.  Endocrine: Negative for polydipsia.    Genitourinary: Negative for dysuria and frequency.  Musculoskeletal:       See HPI  Skin: Negative for rash.  Neurological: Negative for tremors, light-headedness and numbness.  Hematological: Does not bruise/bleed easily.  Psychiatric/Behavioral: Negative for agitation and behavioral problems.    Objective:  BP 136/82   Pulse (!) 111   Ht 5' 1"  (1.549 m)   Wt 235 lb (106.6 kg)   LMP  (LMP Unknown)   SpO2 98%   BMI 44.40 kg/m   BP/Weight 07/11/2020 06/25/2020 91/61/0960 Systolic BP [id number] Diastolic BP 82 78 1147 Wt. (Lbs) 235 238.6 238.8  BMI 44.4 45.08 45.12      Physical Exam Constitutional:      Appearance: She is well-developed. She is obese.  Neck:     Vascular: No JVD.     Comments: Tracheostomy in place Cardiovascular:     Rate and Rhythm: Normal rate.     Heart sounds: Normal heart sounds. No murmur heard.   Pulmonary:     Effort: Pulmonary effort is normal.     Breath sounds: Normal breath sounds. No wheezing or rales.  Chest:     Chest wall: No tenderness.  Abdominal:     General: Bowel sounds are normal. There is no distension.     Palpations: Abdomen is soft. There is no mass.     Tenderness: There is no abdominal tenderness.  Musculoskeletal:        General: Tenderness (on palpation of R arm, R lumbar and R hip region) present.     Right lower leg: No edema.     Left lower leg: No edema.  Neurological:     Mental Status: She is alert and oriented to person, place, and time.  Psychiatric:        Mood and Affect: Mood normal.     CMP Latest Ref Rng & Units 04/10/2020 10/09/2019 07/28/2019  Glucose 65 - 99 mg/dL 184(H) 169(H) 130(H)  BUN 6 - 24 mg/dL 15  21(H) 9  Creatinine 0.57 - 1.00 mg/dL 1.10(H) 1.69(H) 0.99  Sodium 134 - 144 mmol/L 142 140 137  Potassium 3.5 - 5.2 mmol/L 4.1 3.4(L) 3.7  Chloride 96 - 106 mmol/L 106 104 105  CO2 20 - 29 mmol/L 22 20(L) 21(L)  Calcium 8.7 - 10.2 mg/dL 9.0 9.4 9.1  Total Protein 6.0 - 8.5 g/dL 7.3 8.1  -  Total Bilirubin 0.0 - 1.2 mg/dL <0.2 0.7 -  Alkaline Phos 48 - 121 IU/L 111 85 -  AST 0 - 40 IU/L 11 18 -  ALT 0 - 32 IU/L 12 26 -    Lipid Panel     Component Value Date/Time   CHOL 165 06/15/2018 1031   TRIG 124 06/15/2018 1031   HDL 51 06/15/2018 1031   CHOLHDL 3.2 06/15/2018 1031   CHOLHDL 3.9 02/22/2016 0954   VLDL 26 02/22/2016 0954   LDLCALC 89 06/15/2018 1031    CBC    Component Value Date/Time   WBC 11.1 (H) 10/09/2019 2107   RBC 4.99 10/09/2019 2107   HGB 13.5 10/09/2019 2107   HGB 11.9 04/12/2018 1035   HCT 41.3 10/09/2019 2107   HCT 34.8 04/12/2018 1035   PLT 418 (H) 10/09/2019 2107   PLT 373 04/12/2018 1035   MCV 82.8 10/09/2019 2107   MCV 84 04/12/2018 1035   MCH 27.1 10/09/2019 2107   MCHC 32.7 10/09/2019 2107   RDW 14.2 10/09/2019 2107   RDW 14.6 04/12/2018 1035   LYMPHSABS 4.0 (H) 04/12/2018 1035   MONOABS 1.5 (H) 03/11/2018 0932   EOSABS 0.0 04/12/2018 1035   BASOSABS 0.0 04/12/2018 1035    Lab Results  Component Value Date   HGBA1C 7.9 (A) 06/25/2020    Assessment & Plan:  1. Type 1 diabetes mellitus with stage 3a chronic kidney disease ([place]) Uncontrolled with A1c of 7.9; goal is less than 7.0 Management per endocrine Referred for annual eye exam - POCT glucose (manual entry) - Ambulatory referral to Ophthalmology  2. Nausea States she still needs Zofran as needed - ondansetron (ZOFRAN) 4 MG tablet; Take 1 tablet (4 mg total) by mouth every 8 (eight) hours as needed for nausea or vomiting.  Dispense: 20 tablet; Refill: 0  3. Moderate persistent asthma without complication Uncontrolled -could be secondary from the season change Switched from Symbicort to [facility] Continue Spiriva and albuterol MDI - Misc. Devices MISC; Nebulizer device. Diagnosis - Asthma  Dispense: 1 each; Refill: 0 - mometasone-formoterol (DULERA) 200-5 MCG/ACT AERO; Inhale 2 puffs into the lungs in the morning and at bedtime.  Dispense: 1 each; Refill: 6 -  montelukast (SINGULAIR) 10 MG tablet; Take 1 tablet (10 mg total) by mouth at bedtime.  Dispense: 90 tablet; Refill: 1 - albuterol (PROAIR HFA) 108 (90 Base) MCG/ACT inhaler; Inhale 2 puffs into the lungs every 6 (six) hours as needed for wheezing or shortness of breath.  Dispense: 18 g; Refill: 6  4. Benign essential HTN Controlled Counseled on blood pressure goal of less than 130/80, low-sodium, DASH diet, medication compliance, 150 minutes of moderate intensity exercise per week. Discussed medication compliance, adverse effects. - lisinopril (ZESTRIL) 20 MG tablet; Take 1 tablet (20 mg total) by mouth daily.  Dispense: 90 tablet; Refill: 1 - carvedilol (COREG) 25 MG tablet; Take 1 tablet (25 mg total) by mouth 2 (two) times daily with a meal.  Dispense: 180 tablet; Refill: 1 - amLODipine (NORVASC) 10 MG tablet; Take 1 tablet (10 mg total) by mouth daily.  Dispense: 90 tablet; Refill: 1  5. Bulging lumbar disc Stable Managed by [facility] pain management - topiramate (TOPAMAX) 100 MG tablet; Take 1 tablet (100 mg total) by mouth 2 (two) times daily.  Dispense: 180 tablet; Refill: 1 - tiZANidine (ZANAFLEX) 4 MG tablet; Take 1 tablet (4 mg total) by mouth every 8 (eight) hours as needed for muscle spasms.  Dispense: 90 tablet; Refill: 6  6. Intractable migraine with aura without status migrainosus Controlled - SUMAtriptan (IMITREX) 100 MG tablet; At the onset of a migraine; may repeat in 2 hours. Max daily dose 270m  Dispense: 20 tablet; Refill: 6  7. Mild episode of recurrent major depressive disorder (HCC) Controlled - sertraline (ZOLOFT) 100 MG tablet; Take 1 tablet (100 mg total) by mouth at bedtime.  Dispense: 90 tablet; Refill: 1 - hydrOXYzine (ATARAX/VISTARIL) 25 MG tablet; Take 1 tablet (25 mg total) by mouth 3 (three) times daily as needed for anxiety.  Dispense: 180 tablet; Refill: 1 - buPROPion (WELLBUTRIN SR) 150 MG 12 hr tablet; Take 1 tablet (150 mg total) by mouth 2 (two) times  daily.  Dispense: 180 tablet; Refill: 1  8. Hyperlipidemia due to type 1 diabetes mellitus ([place] Controlled Due for lipid panel We will order at next visit - atorvastatin (LIPITOR) 20 MG tablet; Take 1 tablet (20 mg total) by mouth daily.  Dispense: 90 tablet; Refill: 1  9. Encounter for screening mammogram for malignant neoplasm of breast - MM 3D SCREEN BREAST BILATERAL; Future  10. Morbid obesity ([place] She is unable to exercise much due to chronic back pain Advised on stationary exercises she can perform including lifting weights while sitting or using a stationary bike  11. Gastroesophageal reflux disease without esophagitis Uncontrolled Refill PPI - pantoprazole (PROTONIX) 20 MG tablet; Take 1 tablet (20 mg total) by mouth daily.  Dispense: 30 tablet; Refill: 0    No orders of the defined types were placed in this encounter.  Return in about 3 months (around 10/11/2020) for Chronic disease management.        ECharlott Rakes MD, FAAFP. CAdvanced Surgery Center Of Sarasota LLCand WMastic BeachGBadger N[place]  07/11/2020, 4:24 PM

## 2020-07-11 NOTE — Progress Notes (Signed)
Had a fall on her right side and now having pain.

## 2020-07-11 NOTE — Patient Instructions (Signed)

## 2020-07-12 ENCOUNTER — Encounter: Payer: Self-pay | Admitting: Family Medicine

## 2020-07-16 ENCOUNTER — Other Ambulatory Visit: Payer: Self-pay

## 2020-07-16 DIAGNOSIS — E119 Type 2 diabetes mellitus without complications: Secondary | ICD-10-CM | POA: Diagnosis not present

## 2020-07-16 DIAGNOSIS — Z79899 Other long term (current) drug therapy: Secondary | ICD-10-CM | POA: Diagnosis not present

## 2020-07-16 DIAGNOSIS — M25512 Pain in left shoulder: Secondary | ICD-10-CM | POA: Diagnosis not present

## 2020-07-16 DIAGNOSIS — Z930 Tracheostomy status: Secondary | ICD-10-CM | POA: Diagnosis not present

## 2020-07-16 DIAGNOSIS — J4540 Moderate persistent asthma, uncomplicated: Secondary | ICD-10-CM

## 2020-07-16 DIAGNOSIS — M25511 Pain in right shoulder: Secondary | ICD-10-CM | POA: Diagnosis not present

## 2020-07-16 DIAGNOSIS — G4730 Sleep apnea, unspecified: Secondary | ICD-10-CM | POA: Diagnosis not present

## 2020-07-16 MED ORDER — MISC. DEVICES MISC: 0 refills | Status: DC

## 2020-07-18 DIAGNOSIS — J45909 Unspecified asthma, uncomplicated: Secondary | ICD-10-CM | POA: Diagnosis not present

## 2020-07-23 DIAGNOSIS — R000 Tachycardia, unspecified: Secondary | ICD-10-CM | POA: Diagnosis not present

## 2020-07-23 DIAGNOSIS — R52 Pain, unspecified: Secondary | ICD-10-CM | POA: Diagnosis not present

## 2020-07-23 DIAGNOSIS — R1084 Generalized abdominal pain: Secondary | ICD-10-CM | POA: Diagnosis not present

## 2020-07-23 DIAGNOSIS — Z930 Tracheostomy status: Secondary | ICD-10-CM | POA: Diagnosis not present

## 2020-07-23 DIAGNOSIS — R0602 Shortness of breath: Secondary | ICD-10-CM | POA: Diagnosis not present

## 2020-07-23 DIAGNOSIS — R0902 Hypoxemia: Secondary | ICD-10-CM | POA: Diagnosis not present

## 2020-07-31 NOTE — Telephone Encounter (Signed)
Patient is having abdominal pain

## 2020-08-03 ENCOUNTER — Other Ambulatory Visit: Payer: Self-pay | Admitting: Family Medicine

## 2020-08-03 DIAGNOSIS — R112 Nausea with vomiting, unspecified: Secondary | ICD-10-CM

## 2020-08-03 DIAGNOSIS — R110 Nausea: Secondary | ICD-10-CM

## 2020-08-03 MED ORDER — ONDANSETRON HCL 4 MG PO TABS: 4.0000 mg | ORAL_TABLET | Freq: Three times a day (TID) | ORAL | 1 refills | Status: DC | PRN

## 2020-08-03 NOTE — Telephone Encounter (Signed)
Patient still having symptoms

## 2020-08-06 ENCOUNTER — Other Ambulatory Visit: Payer: Self-pay | Admitting: Family Medicine

## 2020-08-06 DIAGNOSIS — R109 Unspecified abdominal pain: Secondary | ICD-10-CM

## 2020-08-09 ENCOUNTER — Encounter (HOSPITAL_COMMUNITY): Payer: Self-pay

## 2020-08-09 ENCOUNTER — Emergency Department (HOSPITAL_COMMUNITY)
Admission: EM | Admit: 2020-08-09 | Discharge: 2020-08-10 | Disposition: A | Discharge status: Home / Self Care | Payer: Medicare Other | Attending: Emergency Medicine | Admitting: Emergency Medicine

## 2020-08-09 ENCOUNTER — Ambulatory Visit (HOSPITAL_COMMUNITY)
Admission: EM | Admit: 2020-08-09 | Discharge: 2020-08-09 | Disposition: A | Discharge status: Home / Self Care | Payer: Medicare Other | Attending: Family Medicine | Admitting: Family Medicine

## 2020-08-09 ENCOUNTER — Emergency Department (HOSPITAL_COMMUNITY): Payer: Medicare Other

## 2020-08-09 ENCOUNTER — Other Ambulatory Visit: Payer: Self-pay

## 2020-08-09 DIAGNOSIS — Z7901 Long term (current) use of anticoagulants: Secondary | ICD-10-CM | POA: Diagnosis not present

## 2020-08-09 DIAGNOSIS — R000 Tachycardia, unspecified: Secondary | ICD-10-CM | POA: Insufficient documentation

## 2020-08-09 DIAGNOSIS — J449 Chronic obstructive pulmonary disease, unspecified: Secondary | ICD-10-CM | POA: Diagnosis not present

## 2020-08-09 DIAGNOSIS — I10 Essential (primary) hypertension: Secondary | ICD-10-CM | POA: Diagnosis not present

## 2020-08-09 DIAGNOSIS — E109 Type 1 diabetes mellitus without complications: Secondary | ICD-10-CM | POA: Insufficient documentation

## 2020-08-09 DIAGNOSIS — E1069 Type 1 diabetes mellitus with other specified complication: Secondary | ICD-10-CM

## 2020-08-09 DIAGNOSIS — Z20822 Contact with and (suspected) exposure to covid-19: Secondary | ICD-10-CM | POA: Diagnosis not present

## 2020-08-09 DIAGNOSIS — R1012 Left upper quadrant pain: Secondary | ICD-10-CM | POA: Diagnosis not present

## 2020-08-09 DIAGNOSIS — K2900 Acute gastritis without bleeding: Secondary | ICD-10-CM | POA: Diagnosis not present

## 2020-08-09 DIAGNOSIS — J45901 Unspecified asthma with (acute) exacerbation: Secondary | ICD-10-CM | POA: Diagnosis not present

## 2020-08-09 DIAGNOSIS — Z79899 Other long term (current) drug therapy: Secondary | ICD-10-CM | POA: Insufficient documentation

## 2020-08-09 DIAGNOSIS — R109 Unspecified abdominal pain: Secondary | ICD-10-CM | POA: Diagnosis not present

## 2020-08-09 DIAGNOSIS — K760 Fatty (change of) liver, not elsewhere classified: Secondary | ICD-10-CM | POA: Diagnosis not present

## 2020-08-09 DIAGNOSIS — Z87891 Personal history of nicotine dependence: Secondary | ICD-10-CM | POA: Diagnosis not present

## 2020-08-09 DIAGNOSIS — K2970 Gastritis, unspecified, without bleeding: Secondary | ICD-10-CM | POA: Insufficient documentation

## 2020-08-09 DIAGNOSIS — R1084 Generalized abdominal pain: Secondary | ICD-10-CM | POA: Diagnosis not present

## 2020-08-09 DIAGNOSIS — Z930 Tracheostomy status: Secondary | ICD-10-CM

## 2020-08-09 DIAGNOSIS — R0682 Tachypnea, not elsewhere classified: Secondary | ICD-10-CM

## 2020-08-09 DIAGNOSIS — R0902 Hypoxemia: Secondary | ICD-10-CM | POA: Diagnosis not present

## 2020-08-09 DIAGNOSIS — E1165 Type 2 diabetes mellitus with hyperglycemia: Secondary | ICD-10-CM | POA: Diagnosis not present

## 2020-08-09 LAB — CBC WITH DIFFERENTIAL/PLATELET
Abs Immature Granulocytes: 0.02 10*3/uL (ref 0.00–0.07)
Basophils Absolute: 0 10*3/uL (ref 0.0–0.1)
Basophils Relative: 0 %
Eosinophils Absolute: 0 10*3/uL (ref 0.0–0.5)
Eosinophils Relative: 0 %
HCT: 38 % (ref 36.0–46.0)
Hemoglobin: 12.2 g/dL (ref 12.0–15.0)
Immature Granulocytes: 0 %
Lymphocytes Relative: 33 %
Lymphs Abs: 3.5 10*3/uL (ref 0.7–4.0)
MCH: 27.7 pg (ref 26.0–34.0)
MCHC: 32.1 g/dL (ref 30.0–36.0)
MCV: 86.4 fL (ref 80.0–100.0)
Monocytes Absolute: 0.5 10*3/uL (ref 0.1–1.0)
Monocytes Relative: 4 %
Neutro Abs: 6.6 10*3/uL (ref 1.7–7.7)
Neutrophils Relative %: 63 %
Platelets: 362 10*3/uL (ref 150–400)
RBC: 4.4 MIL/uL (ref 3.87–5.11)
RDW: 13.5 % (ref 11.5–15.5)
WBC: 10.7 10*3/uL — ABNORMAL HIGH (ref 4.0–10.5)
nRBC: 0 % (ref 0.0–0.2)

## 2020-08-09 LAB — CBG MONITORING, ED: Glucose-Capillary: 215 mg/dL — ABNORMAL HIGH (ref 70–99)

## 2020-08-09 LAB — COMPREHENSIVE METABOLIC PANEL
ALT: 23 U/L (ref 0–44)
AST: 20 U/L (ref 15–41)
Albumin: 3.6 g/dL (ref 3.5–5.0)
Alkaline Phosphatase: 89 U/L (ref 38–126)
Anion gap: 11 (ref 5–15)
BUN: 14 mg/dL (ref 6–20)
CO2: 21 mmol/L — ABNORMAL LOW (ref 22–32)
Calcium: 9.2 mg/dL (ref 8.9–10.3)
Chloride: 106 mmol/L (ref 98–111)
Creatinine, Ser: 1.23 mg/dL — ABNORMAL HIGH (ref 0.44–1.00)
GFR, Estimated: 54 mL/min — ABNORMAL LOW (ref >60)
Glucose, Bld: 255 mg/dL — ABNORMAL HIGH (ref 70–99)
Potassium: 3.8 mmol/L (ref 3.5–5.1)
Sodium: 138 mmol/L (ref 135–145)
Total Bilirubin: 0.4 mg/dL (ref 0.3–1.2)
Total Protein: 7.4 g/dL (ref 6.5–8.1)

## 2020-08-09 LAB — RESPIRATORY PANEL BY RT PCR (FLU A&B, COVID)
Influenza A by PCR: NEGATIVE
Influenza B by PCR: NEGATIVE
SARS Coronavirus 2 by RT PCR: NEGATIVE

## 2020-08-09 LAB — LACTIC ACID, PLASMA: Lactic Acid, Venous: 2.4 mmol/L (ref 0.5–1.9)

## 2020-08-09 LAB — LIPASE, BLOOD: Lipase: 35 U/L (ref 11–51)

## 2020-08-09 MED ORDER — PANTOPRAZOLE SODIUM 40 MG IV SOLR
40.0000 mg | Freq: Once | INTRAVENOUS | Status: AC
  Administered 2020-08-09: 40 mg via INTRAVENOUS
  Filled 2020-08-09: qty 40

## 2020-08-09 MED ORDER — SODIUM CHLORIDE 0.9 % IV BOLUS
1000.0000 mL | Freq: Once | INTRAVENOUS | Status: AC
  Administered 2020-08-10: 1000 mL via INTRAVENOUS

## 2020-08-09 MED ORDER — IOHEXOL 300 MG/ML  SOLN
100.0000 mL | Freq: Once | INTRAMUSCULAR | Status: AC | PRN
  Administered 2020-08-10: 100 mL via INTRAVENOUS

## 2020-08-09 MED ORDER — ONDANSETRON HCL 4 MG/2ML IJ SOLN
4.0000 mg | Freq: Once | INTRAMUSCULAR | Status: AC
  Administered 2020-08-09: 4 mg via INTRAVENOUS
  Filled 2020-08-09: qty 2

## 2020-08-09 MED ORDER — MORPHINE SULFATE (PF) 4 MG/ML IV SOLN
4.0000 mg | Freq: Once | INTRAVENOUS | Status: AC
  Administered 2020-08-09: 4 mg via INTRAVENOUS
  Filled 2020-08-09: qty 1

## 2020-08-09 NOTE — ED Provider Notes (Signed)
BP (!) 153/81 (BP Location: Right Arm)   Pulse (!) 134   Resp 18   LMP  (LMP Unknown)   SpO2 100%   Woman has had progressively worsening abdominal pain over the last 2 weeks.  She has been calling her primary care doctor.  They have been trying to get blood work and scanning arranged.  Her husband accompanies her.  He states that she has repeatedly refused to go to the emergency room.  Today he finally got her to to agree because the pain was severe. She arrived here at the urgent care center writhing back and forth in a wheelchair.  Tears running down her face.  Tachypneic with respiratory rate of 28-30.  Pulse 134.  Obvious discomfort. I met him in the doorway as they arrived and asked for an ambulance to be called.  She clearly needs to be in the emergency room.  She has a tracheostomy tube and is breathing at a rapid rate with secretions collecting in the opening and spewing down her chest. Patient is a type I diabetic.  She has been having vomiting and diarrhea. The EMS arrived promptly before we were able to get a CBG.  Vital signs are as above   Raylene Everts, MD 08/09/20 [phone]

## 2020-08-09 NOTE — ED Triage Notes (Signed)
Pt arrived via EMS from urgent care c/o abdominal pain. EMS reports this pain has been ongoing but worse today.

## 2020-08-09 NOTE — ED Provider Notes (Signed)
[facility] EMERGENCY DEPARTMENT Provider Note   CSN: [id number] Arrival date & time: 08/09/20  2018    History Chief Complaint  Patient presents with  . Abdominal Pain    [name] is a 49 y.o. female with a history of DM, HTN, OSA, asthma, depression, s/p tracheostomy 2/2 intubation related tracheal injury, subglottic stenosis presents to the ED for a complaint of abdominal pain.  Patient reports having on and off abdominal pain for 1 month.  This pain has progressively worsened over the last 2 weeks.  The pain is described as crampy and "something twisting inside my stomach" and localized in the left upper quadrant.  The pain is described as 10/10, constant and worse with eating or drinking.  Nothing relieves the pain.  The pain is associated with nausea, vomiting,1 episode of diarrhea, back pain, headache, fever and chills.  Patient denies chest pain, shortness of breath, dizziness, constipation, leg pain or dysuria.  Patient reports she was seen in the ED back in January for similar symptoms but does not remember what she was given.  Chart review shows that patient was diagnosed with gastroenteritis, given IV fluids and antiemetics and discharged with Phenergan, Bentyl and Protonix.    Past Medical History:  Diagnosis Date  . Allergy   . Anxiety   . Arthritis   . Asthma   . Chronic back pain   . Chronic chest pain    resolved, no problems since 2019 per patient 07/27/19  . COPD (chronic obstructive pulmonary disease) ([place])   . Depression   . DM (diabetes mellitus) ([place])    INSULIN DEPENDENT - TYPE 1  . GERD (gastroesophageal reflux disease)   . Headache(784.0)   . Hyperlipidemia    no med, diet controlled  . Hypertension   . Hypokalemia   . Respiratory disease 05/2017   uses inhalers, neb tx prn, no oxygen  . Sleep apnea    does not use CPAP due to trach  . Tracheostomy in place [facility]) 02/2017    Patient Active Problem List    Diagnosis Date Noted  . Rotator cuff tear 07/28/2019  . Diabetes ([place]) 04/12/2019  . Acute asthma exacerbation 03/22/2019  . Chest pain of uncertain etiology 12/87/8676  . Lactic acidosis 03/22/2019  . Chronic right shoulder pain   . Community acquired pneumonia 10/25/2018  . Asthma, chronic, unspecified asthma severity, with acute exacerbation 10/25/2018  . Acute respiratory failure with hypoxia and hypercapnia ([employer]) 10/23/2018  . Acute pain of right shoulder due to trauma 10/05/2018  . Chronic bilateral low back pain without sciatica 10/05/2018  . Dysphonia 10/05/2018  . Severe asthma with exacerbation 10/05/2018  . Rotator cuff arthropathy, right 06/15/2018  . AKI (acute kidney injury) ([place]) 03/06/2018  . Carpal tunnel syndrome 01/18/2018  . Normocytic anemia 05/06/2017  . Tracheostomy status ([place]) 04/28/2017  . Tracheostomy dependent ([place]) 03/26/2017  . Subglottic stenosis 03/06/2017  . Tracheal stenosis 03/04/2017  . Acute blood loss anemia   . Generalized anxiety disorder   . Morbid obesity ([place])   . Chronic pain syndrome   . Gastritis and gastroduodenitis 02/22/2016  . Depression 02/22/2016  . Migraine 01/30/2015  . Bulging lumbar disc 09/05/2013  . Essential hypertension 09/05/2013  . Allergic rhinitis, seasonal 08/11/2012  . Chronic cough 08/11/2012  . Sleep apnea, obstructive 12/04/2011    Past Surgical History:  Procedure Laterality Date  . ABDOMINAL HYSTERECTOMY  2009  . APPENDECTOMY    . CESAREAN SECTION  x 3  . CHOLECYSTECTOMY N/A 03/02/2014   Procedure: LAPAROSCOPIC CHOLECYSTECTOMY;  Surgeon: Thomas A. Cornett, MD;  Location: [facility] OR;  Service: General;  Laterality: N/A;  . HERNIA REPAIR    . PANENDOSCOPY N/A 03/04/2017   Procedure: PANENDOSCOPY WITH POSSIBLE FOREIGN BODY REMOVAL;  Surgeon: Wolicki, Karol, MD;  Location: [facility] OR;  Service: ENT;  Laterality: N/A;  . ROTATOR CUFF REPAIR Left   . ROTATOR CUFF REPAIR Right 07/27/2019  . SHOULDER ARTHROSCOPY  WITH SUBACROMIAL DECOMPRESSION, ROTATOR CUFF REPAIR AND BICEP TENDON REPAIR Right 07/28/2019   Procedure: RIGHT SHOULDER ARTHROSCOP, MINI OPEN ROTATOR CUFF TEAR REPAIR,  BICEPS TENODESIS, DISTAL CLAVICLE EXCISION;  Surgeon: Dean, Gregory Scott, MD;  Location: [facility] OR;  Service: Orthopedics;  Laterality: Right;  . TRACHEOSTOMY  02/2017  . VESICOVAGINAL FISTULA CLOSURE W/ TAH  2009     OB History    Gravida  4   Para  3   Term  3   Preterm      AB  1   Living  3     SAB      TAB      Ectopic  1   Multiple      Live Births  3           Family History  Problem Relation Age of Onset  . Heart attack Mother   . Stroke Mother   . Diabetes Mother   . Hypertension Mother   . Arthritis Mother   . Stroke Father   . Hypertension Sister   . Diabetes Sister   . Seizures Brother   . Diabetes Brother   . Colon cancer Neg Hx   . Rectal cancer Neg Hx   . Stomach cancer Neg Hx     Social History   Tobacco Use  . Smoking status: Former Smoker    Packs/day: 0.25    Years: 22.00    Pack years: 5.50    Types: Cigarettes    Quit date: 01/20/2017    Years since quitting: 3.5  . Smokeless tobacco: Never Used  Vaping Use  . Vaping Use: Never used  Substance Use Topics  . Alcohol use: Yes    Alcohol/week: 0.0 standard drinks    Comment: social wine  . Drug use: Yes    Types: Marijuana    Comment: Last Use In July 2021    Home Medications Prior to Admission medications   Medication Sig Start Date End Date Taking? Authorizing Provider  acetaminophen (TYLENOL) 325 MG tablet Take 1-2 tablets (325-650 mg total) by mouth every 6 (six) hours as needed for mild pain (pain score 1-3 or temp > 100.5). 03/15/20   Newlin, Enobong, MD  albuterol (PROAIR HFA) 108 (90 Base) MCG/ACT inhaler Inhale 2 puffs into the lungs every 6 (six) hours as needed for wheezing or shortness of breath. 07/11/20   Newlin, Enobong, MD  albuterol (PROVENTIL) (2.5 MG/3ML) 0.083% nebulizer solution Take 3 mLs  (2.5 mg total) by nebulization every 6 (six) hours as needed for wheezing or shortness of breath. 06/18/20   Newlin, Enobong, MD  amLODipine (NORVASC) 10 MG tablet Take 1 tablet (10 mg total) by mouth daily. 07/11/20   Newlin, Enobong, MD  atorvastatin (LIPITOR) 20 MG tablet Take 1 tablet (20 mg total) by mouth daily. 07/11/20   Newlin, Enobong, MD  Blood Glucose Monitoring Suppl (ONETOUCH VERIO) w/Device KIT 1 each by Does not apply route daily. Use to monitor glucose levels daily; E11.9 04/14/19   Ellison,   Sean, MD  buPROPion (WELLBUTRIN SR) 150 MG 12 hr tablet Take 1 tablet (150 mg total) by mouth 2 (two) times daily. 07/11/20   Newlin, Enobong, MD  carvedilol (COREG) 25 MG tablet Take 1 tablet (25 mg total) by mouth 2 (two) times daily with a meal. 07/11/20   Newlin, Enobong, MD  clotrimazole-betamethasone (LOTRISONE) cream Apply 1 application topically 3 (three) times daily as needed. For rash 03/20/20   Ellison, Sean, MD  furosemide (LASIX) 20 MG tablet Take 1 tablet (20 mg total) by mouth daily. 03/31/19   Newlin, Enobong, MD  gabapentin (NEURONTIN) 300 MG capsule Take 2 capsules (600 mg total) by mouth 2 (two) times daily. 10/29/18   Joseph, Preetha, MD  glucose blood (ONETOUCH VERIO) test strip Use to monitor glucose levels daily; E11.9 09/14/19   Ellison, Sean, MD  HYDROcodone-acetaminophen (NORCO/VICODIN) 5-325 MG tablet Take 1 tablet by mouth daily as needed for moderate pain. Patient not taking: Reported on 07/11/2020 11/02/19   Magnant, Charles L, PA-C  hydrOXYzine (ATARAX/VISTARIL) 25 MG tablet Take 1 tablet (25 mg total) by mouth 3 (three) times daily as needed for anxiety. 07/11/20   Newlin, Enobong, MD  Insulin Degludec (TRESIBA FLEXTOUCH) 200 UNIT/ML SOPN Inject 140 Units into the skin daily. 07/11/19   Ellison, Sean, MD  Insulin Pen Needle (FIFTY50 PEN NEEDLES) 31G X 8 MM MISC Inject 1 Units as directed daily. 07/30/18   [provider]  lisinopril (ZESTRIL) 20 MG tablet Take 1  tablet (20 mg total) by mouth daily. 07/11/20   Newlin, Enobong, MD  Melatonin 3 MG TABS Take 3 mg by mouth at bedtime as needed (for sleep).  10/31/17   [provider]  Misc. Devices MISC Nebulizer device. Diagnosis - Asthma 07/16/20   Newlin, Enobong, MD  mometasone-formoterol (DULERA) 200-5 MCG/ACT AERO Inhale 2 puffs into the lungs in the morning and at bedtime. 07/11/20   Newlin, Enobong, MD  montelukast (SINGULAIR) 10 MG tablet Take 1 tablet (10 mg total) by mouth at bedtime. 07/11/20   Newlin, Enobong, MD  NARCAN 4 MG/0.1ML LIQD nasal spray kit SMARTSIG:1 Both Nares PRN 04/18/20   [provider]  ondansetron (ZOFRAN) 4 MG tablet Take 1 tablet (4 mg total) by mouth every 8 (eight) hours as needed for nausea or vomiting. 08/03/20   Newlin, Enobong, MD  OneTouch Delica Lancets 33G MISC 1 each by Does not apply route daily. Use to monitor glucose levels daily; E11.9 04/14/19   Ellison, Sean, MD  oxyCODONE-acetaminophen (PERCOCET) 10-325 MG tablet Take 1 tablet by mouth 4 (four) times daily as needed. 05/02/20   [provider]  pantoprazole (PROTONIX) 20 MG tablet Take 1 tablet (20 mg total) by mouth daily. 07/11/20   Newlin, Enobong, MD  Semaglutide, 1 MG/DOSE, (OZEMPIC, 1 MG/DOSE,) 2 MG/1.5ML SOPN Inject 1 mg into the skin once a week. 12/19/19   Ellison, Sean, MD  sertraline (ZOLOFT) 100 MG tablet Take 1 tablet (100 mg total) by mouth at bedtime. 07/11/20   Newlin, Enobong, MD  SUMAtriptan (IMITREX) 100 MG tablet At the onset of a migraine; may repeat in 2 hours. Max daily dose 200mg 07/11/20   Newlin, Enobong, MD  tiZANidine (ZANAFLEX) 4 MG tablet Take 1 tablet (4 mg total) by mouth every 8 (eight) hours as needed for muscle spasms. 07/11/20   Newlin, Enobong, MD  topiramate (TOPAMAX) 100 MG tablet Take 1 tablet (100 mg total) by mouth 2 (two) times daily. 07/11/20   Newlin, Enobong, MD  fluticasone-salmeterol (  ADVAIR HFA) 230-21 MCG/ACT inhaler Inhale 2 puffs into the  lungs 2 (two) times daily. 10/15/17 10/15/17  [name], MD    Allergies    Reglan [metoclopramide]  Review of Systems   Review of Systems  Constitutional: Positive for appetite change, chills and fever.  HENT: Negative for congestion.   Eyes: Negative for visual disturbance.  Respiratory: Negative for shortness of breath and wheezing.   Cardiovascular: Negative for chest pain.  Gastrointestinal: Positive for abdominal pain, nausea and vomiting. Negative for blood in stool and diarrhea.  Genitourinary: Negative for difficulty urinating.  Musculoskeletal: Positive for back pain. Negative for myalgias.  Neurological: Positive for headaches. Negative for dizziness.  Psychiatric/Behavioral: Negative for agitation.    Physical Exam Updated Vital Signs BP (!) 134/116 (BP Location: Left Wrist)   Pulse (!) 123   Temp 99.4 F (37.4 C) (Oral)   Resp (!) 24   LMP  (LMP Unknown)   SpO2 100%   Physical Exam Constitutional:      General: She is in acute distress.     Comments: In moderate distress  HENT:     Head: Normocephalic and atraumatic.     Mouth/Throat:     Mouth: Mucous membranes are moist.     Comments: Tracheostomy in place Eyes:     General: No scleral icterus. Cardiovascular:     Rate and Rhythm: Regular rhythm. Tachycardia present.     Heart sounds: Normal heart sounds.  Pulmonary:     Effort: Pulmonary effort is normal. No respiratory distress.  Abdominal:     General: Bowel sounds are normal. There is distension and abdominal bruit.     Palpations: Abdomen is soft. There is no mass.     Tenderness: There is abdominal tenderness in the left upper quadrant. There is no guarding or rebound.  Skin:    General: Skin is warm and dry.     Findings: No rash.  Neurological:     General: No focal deficit present.     Mental Status: She is alert and oriented to person, place, and time.  Psychiatric:        Mood and Affect: Mood normal.     ED Results /  Procedures / Treatments   Labs (all labs ordered are listed, but only abnormal results are displayed) Labs Reviewed  CBC WITH DIFFERENTIAL/PLATELET - Abnormal; Notable for the following components:      Result Value   WBC 10.7 (*)    All other components within normal limits  CBG MONITORING, ED - Abnormal; Notable for the following components:   Glucose-Capillary 215 (*)    All other components within normal limits  RESPIRATORY PANEL BY RT PCR (FLU A&B, COVID)  COMPREHENSIVE METABOLIC PANEL  LIPASE, BLOOD  URINALYSIS, ROUTINE W REFLEX MICROSCOPIC  LACTIC ACID, PLASMA  LACTIC ACID, PLASMA    EKG None  Radiology No results found.  Procedures Procedures (including critical care time)  Medications Ordered in ED Medications  morphine 4 MG/ML injection 4 mg (4 mg Intravenous Given 08/09/20 2145)  ondansetron (ZOFRAN) injection 4 mg (4 mg Intravenous Given 08/09/20 2140)  pantoprazole (PROTONIX) injection 40 mg (40 mg Intravenous Given 08/09/20 2142)    ED Course  I have reviewed the triage vital signs and the nursing notes.  Pertinent labs & imaging results that were available during my care of the patient were reviewed by me and considered in my medical decision making (see chart for details).    MDM Rules/Calculators/A&P  Patient is a 48-year-old female with a trach collar who presents for evaluation of progressively worsening abdominal pain described as crampy and twisting in the left upper quadrant. Abdominal pain is associated with nausea/vomiting. Patient found to be tachycardic and tachypneic on arrival. On exam, patient tenderness to palpation on LUQ. No rebound tenderness, no guarding and no organomegaly. No white count and lipase normal. CMP shows mild AKI. Symptoms less likely to be secondary to an infectious etiology. Will consider GERD versus peptic ulcer on the differential.  Treated with IV morphine, Zofran and Protonix.  Pending CT  abdomen/pelvis.   Patient handoff to incoming provider.  Final Clinical Impression(s) / ED Diagnoses Final diagnoses:  Acute gastritis without hemorrhage, unspecified gastritis type  Left upper quadrant abdominal pain    Rx / DC Orders ED Discharge Orders    None       ,  M, MD 08/11/20 0053    Long, Joshua G, MD 08/13/20 2028  

## 2020-08-09 NOTE — ED Notes (Signed)
Arrived with husband.  Patient having abdominal pain.  Pain for a month.  Pain has worsened over the past 2 weeks.  Patient has had discussions with pcp, discussed ct scans-but no definitive plan of care per patient.

## 2020-08-09 NOTE — ED Notes (Signed)
Patient is being discharged from the Urgent Care and sent to the Emergency Department via [facility] . Per dr Meda Coffee, patient is in need of higher level of care due to extreme abdominal pain. Patient is aware and verbalizes understanding of plan of care.  Vitals:   08/09/20 1942  BP: (!) 153/81  Pulse: (!) 134  Resp: 18  SpO2: 100%

## 2020-08-10 ENCOUNTER — Emergency Department (HOSPITAL_COMMUNITY): Payer: Medicare Other

## 2020-08-10 ENCOUNTER — Other Ambulatory Visit (HOSPITAL_COMMUNITY): Payer: Medicare Other

## 2020-08-10 DIAGNOSIS — R109 Unspecified abdominal pain: Secondary | ICD-10-CM | POA: Diagnosis not present

## 2020-08-10 DIAGNOSIS — K760 Fatty (change of) liver, not elsewhere classified: Secondary | ICD-10-CM | POA: Diagnosis not present

## 2020-08-10 LAB — LACTIC ACID, PLASMA: Lactic Acid, Venous: 1.7 mmol/L (ref 0.5–1.9)

## 2020-08-10 MED ORDER — IOHEXOL 300 MG/ML  SOLN: 100.0000 mL | Freq: Once | INTRAMUSCULAR | Status: DC | PRN

## 2020-08-10 MED ORDER — PROMETHAZINE HCL 25 MG PO TABS: 25.0000 mg | ORAL_TABLET | Freq: Four times a day (QID) | ORAL | 0 refills | Status: DC | PRN

## 2020-08-10 MED ORDER — MORPHINE SULFATE (PF) 4 MG/ML IV SOLN
4.0000 mg | Freq: Once | INTRAVENOUS | Status: AC
  Administered 2020-08-10: 4 mg via INTRAVENOUS
  Filled 2020-08-10: qty 1

## 2020-08-10 MED ORDER — SUCRALFATE 1 G PO TABS: 1.0000 g | ORAL_TABLET | Freq: Three times a day (TID) | ORAL | 0 refills | Status: DC

## 2020-08-10 MED ORDER — DICYCLOMINE HCL 20 MG PO TABS: 20.0000 mg | ORAL_TABLET | Freq: Three times a day (TID) | ORAL | 0 refills | Status: DC

## 2020-08-10 NOTE — ED Notes (Signed)
Pt in CT unable to reassess vitals at this time.

## 2020-08-10 NOTE — ED Provider Notes (Signed)
Patient signed out to me to follow-up on CT scan.  Patient transferred from urgent care because of abdominal pain.  She has been having abdominal pain for several weeks.  It sounds as though when she was at urgent care she was in some distress with tears and complaining of severe pain.  Here in the emergency department she is somewhat better.  Her tachycardia has resolved with fluids and medications for pain.  Lab work reviewed, no significant abnormalities noted.  CT scan does not show any acute abnormality.  Discharge with symptomatic treatment for her vomiting and diarrhea.  Additionally aggressively treat for GERD/gastritis.  Follow-up with GI and primary care.    Orpah Greek, MD 08/10/20 [phone]

## 2020-08-13 ENCOUNTER — Other Ambulatory Visit: Payer: Self-pay | Admitting: Family Medicine

## 2020-08-13 DIAGNOSIS — K219 Gastro-esophageal reflux disease without esophagitis: Secondary | ICD-10-CM

## 2020-08-13 MED ORDER — PANTOPRAZOLE SODIUM 40 MG PO TBEC: 40.0000 mg | DELAYED_RELEASE_TABLET | Freq: Two times a day (BID) | ORAL | 0 refills | Status: DC

## 2020-08-22 DIAGNOSIS — Z79899 Other long term (current) drug therapy: Secondary | ICD-10-CM | POA: Diagnosis not present

## 2020-08-22 DIAGNOSIS — G4730 Sleep apnea, unspecified: Secondary | ICD-10-CM | POA: Diagnosis not present

## 2020-08-22 DIAGNOSIS — M25511 Pain in right shoulder: Secondary | ICD-10-CM | POA: Diagnosis not present

## 2020-08-22 DIAGNOSIS — J45909 Unspecified asthma, uncomplicated: Secondary | ICD-10-CM | POA: Diagnosis not present

## 2020-08-22 DIAGNOSIS — Z930 Tracheostomy status: Secondary | ICD-10-CM | POA: Diagnosis not present

## 2020-08-26 ENCOUNTER — Inpatient Hospital Stay (HOSPITAL_COMMUNITY)
Admission: EM | Admit: 2020-08-26 | Discharge: 2020-08-29 | DRG: 206 | Disposition: A | Discharge status: Home / Self Care | Payer: Medicare Other | Attending: Family Medicine | Admitting: Family Medicine

## 2020-08-26 ENCOUNTER — Other Ambulatory Visit: Payer: Self-pay

## 2020-08-26 ENCOUNTER — Emergency Department (HOSPITAL_COMMUNITY): Payer: Medicare Other

## 2020-08-26 ENCOUNTER — Encounter (HOSPITAL_COMMUNITY): Payer: Self-pay | Admitting: Emergency Medicine

## 2020-08-26 DIAGNOSIS — J9503 Malfunction of tracheostomy stoma: Principal | ICD-10-CM | POA: Diagnosis present

## 2020-08-26 DIAGNOSIS — Z6841 Body Mass Index (BMI) 40.0 and over, adult: Secondary | ICD-10-CM

## 2020-08-26 DIAGNOSIS — I129 Hypertensive chronic kidney disease with stage 1 through stage 4 chronic kidney disease, or unspecified chronic kidney disease: Secondary | ICD-10-CM | POA: Diagnosis present

## 2020-08-26 DIAGNOSIS — E1122 Type 2 diabetes mellitus with diabetic chronic kidney disease: Secondary | ICD-10-CM | POA: Diagnosis present

## 2020-08-26 DIAGNOSIS — J45909 Unspecified asthma, uncomplicated: Secondary | ICD-10-CM | POA: Diagnosis present

## 2020-08-26 DIAGNOSIS — I10 Essential (primary) hypertension: Secondary | ICD-10-CM | POA: Diagnosis not present

## 2020-08-26 DIAGNOSIS — E1165 Type 2 diabetes mellitus with hyperglycemia: Secondary | ICD-10-CM | POA: Diagnosis present

## 2020-08-26 DIAGNOSIS — G8929 Other chronic pain: Secondary | ICD-10-CM | POA: Diagnosis present

## 2020-08-26 DIAGNOSIS — R490 Dysphonia: Secondary | ICD-10-CM | POA: Diagnosis present

## 2020-08-26 DIAGNOSIS — J4541 Moderate persistent asthma with (acute) exacerbation: Secondary | ICD-10-CM

## 2020-08-26 DIAGNOSIS — J441 Chronic obstructive pulmonary disease with (acute) exacerbation: Secondary | ICD-10-CM | POA: Diagnosis not present

## 2020-08-26 DIAGNOSIS — Z7951 Long term (current) use of inhaled steroids: Secondary | ICD-10-CM

## 2020-08-26 DIAGNOSIS — R0600 Dyspnea, unspecified: Secondary | ICD-10-CM | POA: Diagnosis present

## 2020-08-26 DIAGNOSIS — G4733 Obstructive sleep apnea (adult) (pediatric): Secondary | ICD-10-CM | POA: Diagnosis not present

## 2020-08-26 DIAGNOSIS — E785 Hyperlipidemia, unspecified: Secondary | ICD-10-CM | POA: Diagnosis not present

## 2020-08-26 DIAGNOSIS — F32A Depression, unspecified: Secondary | ICD-10-CM | POA: Diagnosis not present

## 2020-08-26 DIAGNOSIS — Z794 Long term (current) use of insulin: Secondary | ICD-10-CM | POA: Diagnosis not present

## 2020-08-26 DIAGNOSIS — J4551 Severe persistent asthma with (acute) exacerbation: Secondary | ICD-10-CM | POA: Diagnosis present

## 2020-08-26 DIAGNOSIS — K219 Gastro-esophageal reflux disease without esophagitis: Secondary | ICD-10-CM | POA: Diagnosis not present

## 2020-08-26 DIAGNOSIS — Z823 Family history of stroke: Secondary | ICD-10-CM | POA: Diagnosis not present

## 2020-08-26 DIAGNOSIS — Z79899 Other long term (current) drug therapy: Secondary | ICD-10-CM

## 2020-08-26 DIAGNOSIS — N1831 Chronic kidney disease, stage 3a: Secondary | ICD-10-CM | POA: Diagnosis not present

## 2020-08-26 DIAGNOSIS — R079 Chest pain, unspecified: Secondary | ICD-10-CM

## 2020-08-26 DIAGNOSIS — Z833 Family history of diabetes mellitus: Secondary | ICD-10-CM

## 2020-08-26 DIAGNOSIS — G894 Chronic pain syndrome: Secondary | ICD-10-CM | POA: Diagnosis present

## 2020-08-26 DIAGNOSIS — Z930 Tracheostomy status: Secondary | ICD-10-CM | POA: Diagnosis not present

## 2020-08-26 DIAGNOSIS — E109 Type 1 diabetes mellitus without complications: Secondary | ICD-10-CM

## 2020-08-26 DIAGNOSIS — Z8261 Family history of arthritis: Secondary | ICD-10-CM | POA: Diagnosis not present

## 2020-08-26 DIAGNOSIS — R0789 Other chest pain: Secondary | ICD-10-CM | POA: Diagnosis not present

## 2020-08-26 DIAGNOSIS — M545 Low back pain, unspecified: Secondary | ICD-10-CM | POA: Diagnosis present

## 2020-08-26 DIAGNOSIS — J9620 Acute and chronic respiratory failure, unspecified whether with hypoxia or hypercapnia: Secondary | ICD-10-CM | POA: Diagnosis not present

## 2020-08-26 DIAGNOSIS — R0602 Shortness of breath: Secondary | ICD-10-CM | POA: Diagnosis not present

## 2020-08-26 DIAGNOSIS — Z888 Allergy status to other drugs, medicaments and biological substances status: Secondary | ICD-10-CM

## 2020-08-26 DIAGNOSIS — N839 Noninflammatory disorder of ovary, fallopian tube and broad ligament, unspecified: Secondary | ICD-10-CM | POA: Diagnosis present

## 2020-08-26 DIAGNOSIS — J45901 Unspecified asthma with (acute) exacerbation: Secondary | ICD-10-CM | POA: Diagnosis present

## 2020-08-26 DIAGNOSIS — E1322 Other specified diabetes mellitus with diabetic chronic kidney disease: Secondary | ICD-10-CM | POA: Diagnosis not present

## 2020-08-26 DIAGNOSIS — G43909 Migraine, unspecified, not intractable, without status migrainosus: Secondary | ICD-10-CM | POA: Diagnosis not present

## 2020-08-26 DIAGNOSIS — E119 Type 2 diabetes mellitus without complications: Secondary | ICD-10-CM

## 2020-08-26 DIAGNOSIS — Z8249 Family history of ischemic heart disease and other diseases of the circulatory system: Secondary | ICD-10-CM

## 2020-08-26 DIAGNOSIS — Z87891 Personal history of nicotine dependence: Secondary | ICD-10-CM | POA: Diagnosis not present

## 2020-08-26 DIAGNOSIS — F411 Generalized anxiety disorder: Secondary | ICD-10-CM | POA: Diagnosis present

## 2020-08-26 DIAGNOSIS — Z20822 Contact with and (suspected) exposure to covid-19: Secondary | ICD-10-CM | POA: Diagnosis present

## 2020-08-26 LAB — CBC
HCT: 35.5 % — ABNORMAL LOW (ref 36.0–46.0)
Hemoglobin: 11.8 g/dL — ABNORMAL LOW (ref 12.0–15.0)
MCH: 28.9 pg (ref 26.0–34.0)
MCHC: 33.2 g/dL (ref 30.0–36.0)
MCV: 86.8 fL (ref 80.0–100.0)
Platelets: 404 10*3/uL — ABNORMAL HIGH (ref 150–400)
RBC: 4.09 MIL/uL (ref 3.87–5.11)
RDW: 13.7 % (ref 11.5–15.5)
WBC: 7.2 10*3/uL (ref 4.0–10.5)
nRBC: 0 % (ref 0.0–0.2)

## 2020-08-26 LAB — RESP PANEL BY RT-PCR (FLU A&B, COVID) ARPGX2
Influenza A by PCR: NEGATIVE
Influenza B by PCR: NEGATIVE
SARS Coronavirus 2 by RT PCR: NEGATIVE

## 2020-08-26 LAB — TROPONIN I (HIGH SENSITIVITY)
Troponin I (High Sensitivity): 3 ng/L (ref <18)
Troponin I (High Sensitivity): 3 ng/L (ref <18)

## 2020-08-26 LAB — BASIC METABOLIC PANEL
Anion gap: 11 (ref 5–15)
BUN: 9 mg/dL (ref 6–20)
CO2: 23 mmol/L (ref 22–32)
Calcium: 9 mg/dL (ref 8.9–10.3)
Chloride: 104 mmol/L (ref 98–111)
Creatinine, Ser: 1.07 mg/dL — ABNORMAL HIGH (ref 0.44–1.00)
GFR, Estimated: >60 mL/min (ref >60)
Glucose, Bld: 203 mg/dL — ABNORMAL HIGH (ref 70–99)
Potassium: 4.3 mmol/L (ref 3.5–5.1)
Sodium: 138 mmol/L (ref 135–145)

## 2020-08-26 LAB — I-STAT BETA HCG BLOOD, ED (MC, WL, AP ONLY): I-stat hCG, quantitative: <5 m[IU]/mL (ref <5)

## 2020-08-26 MED ORDER — MORPHINE SULFATE (PF) 4 MG/ML IV SOLN
4.0000 mg | Freq: Once | INTRAVENOUS | Status: AC
  Administered 2020-08-26: 4 mg via INTRAVENOUS
  Filled 2020-08-26: qty 1

## 2020-08-26 MED ORDER — AEROCHAMBER PLUS FLO-VU LARGE MISC
1.0000 | Freq: Once | Status: AC
  Administered 2020-08-26: 1

## 2020-08-26 MED ORDER — ALBUTEROL (5 MG/ML) CONTINUOUS INHALATION SOLN
10.0000 mg/h | INHALATION_SOLUTION | RESPIRATORY_TRACT | Status: DC
  Administered 2020-08-26: 10 mg/h via RESPIRATORY_TRACT
  Filled 2020-08-26: qty 20

## 2020-08-26 MED ORDER — LORAZEPAM 2 MG/ML IJ SOLN
0.5000 mg | Freq: Once | INTRAMUSCULAR | Status: AC
  Administered 2020-08-26: 0.5 mg via INTRAVENOUS
  Filled 2020-08-26: qty 1

## 2020-08-26 MED ORDER — ONDANSETRON HCL 4 MG/2ML IJ SOLN
4.0000 mg | Freq: Once | INTRAMUSCULAR | Status: AC
  Administered 2020-08-26: 4 mg via INTRAVENOUS
  Filled 2020-08-26: qty 2

## 2020-08-26 MED ORDER — DEXAMETHASONE SODIUM PHOSPHATE 10 MG/ML IJ SOLN
10.0000 mg | Freq: Once | INTRAMUSCULAR | Status: AC
  Administered 2020-08-26: 10 mg via INTRAVENOUS
  Filled 2020-08-26: qty 1

## 2020-08-26 MED ORDER — ALBUTEROL SULFATE HFA 108 (90 BASE) MCG/ACT IN AERS
2.0000 | INHALATION_SPRAY | Freq: Once | RESPIRATORY_TRACT | Status: AC
  Administered 2020-08-26: 2 via RESPIRATORY_TRACT
  Filled 2020-08-26: qty 6.7

## 2020-08-26 MED ORDER — IOHEXOL 350 MG/ML SOLN
100.0000 mL | Freq: Once | INTRAVENOUS | Status: AC | PRN
  Administered 2020-08-26: 75 mL via INTRAVENOUS

## 2020-08-26 NOTE — Progress Notes (Addendum)
RT able to pass suction catheter with no issues except pt trying to pull away from pt. Very small amount of clear thin secretions removed from pt's trach. Pt reports cough/sob/fever x 1 week. Pt to be tested for covid.

## 2020-08-26 NOTE — ED Triage Notes (Signed)
Pt has trach.  Reports SOB, blood streaked sputum, and pain to center of chest x 1 week.  States she is having to change trach frequently.

## 2020-08-26 NOTE — ED Provider Notes (Signed)
[facility] EMERGENCY DEPARTMENT Provider Note   CSN: [id number] Arrival date & time: 08/26/20  1544     History Chief Complaint  Patient presents with  . blood in sputum (trach pt)  . Chest Pain  . Shortness of Breath    [name] is a 49 y.o. female.  Pt presents to the ED today with sob and cough.  She has had a fever.  She has a trach and has had increased sputum production.  She has a suction set up at home and has been having to suction frequently.  The pt has not been vaccinated against covid.  She does c/o right sided cp.  She thinks it may be from coughing so much.  She is so sob that she is unable to give me much hx.        Past Medical History:  Diagnosis Date  . Allergy   . Anxiety   . Arthritis   . Asthma   . Chronic back pain   . Chronic chest pain    resolved, no problems since 2019 per patient 07/27/19  . COPD (chronic obstructive pulmonary disease) ([place])   . Depression   . DM (diabetes mellitus) ([place])    INSULIN DEPENDENT - TYPE 1  . GERD (gastroesophageal reflux disease)   . Headache(784.0)   . Hyperlipidemia    no med, diet controlled  . Hypertension   . Hypokalemia   . Respiratory disease 05/2017   uses inhalers, neb tx prn, no oxygen  . Sleep apnea    does not use CPAP due to trach  . Tracheostomy in place [facility]) 02/2017    Patient Active Problem List   Diagnosis Date Noted  . Rotator cuff tear 07/28/2019  . Diabetes ([place]) 04/12/2019  . Acute asthma exacerbation 03/22/2019  . Chest pain of uncertain etiology 30/86/5784  . Lactic acidosis 03/22/2019  . Chronic right shoulder pain   . Community acquired pneumonia 10/25/2018  . Asthma, chronic, unspecified asthma severity, with acute exacerbation 10/25/2018  . Acute respiratory failure with hypoxia and hypercapnia ([place]) 10/23/2018  . Acute pain of right shoulder due to trauma 10/05/2018  . Chronic bilateral low back pain without sciatica 10/05/2018  .  Dysphonia 10/05/2018  . Severe asthma with exacerbation 10/05/2018  . Rotator cuff arthropathy, right 06/15/2018  . AKI (acute kidney injury) ([place]) 03/06/2018  . Carpal tunnel syndrome 01/18/2018  . Normocytic anemia 05/06/2017  . Tracheostomy status ([place]) 04/28/2017  . Tracheostomy dependent ([place]) 03/26/2017  . Subglottic stenosis 03/06/2017  . Tracheal stenosis 03/04/2017  . Acute blood loss anemia   . Generalized anxiety disorder   . Morbid obesity ([place])   . Chronic pain syndrome   . Gastritis and gastroduodenitis 02/22/2016  . Depression 02/22/2016  . Migraine 01/30/2015  . Bulging lumbar disc 09/05/2013  . Essential hypertension 09/05/2013  . Allergic rhinitis, seasonal 08/11/2012  . Chronic cough 08/11/2012  . Sleep apnea, obstructive 12/04/2011    Past Surgical History:  Procedure Laterality Date  . ABDOMINAL HYSTERECTOMY  2009  . APPENDECTOMY    . CESAREAN SECTION     x 3  . CHOLECYSTECTOMY N/A 03/02/2014   Procedure: LAPAROSCOPIC CHOLECYSTECTOMY;  Surgeon: Joyice Faster. Cornett, MD;  Location: [place];  Service: General;  Laterality: N/A;  . HERNIA REPAIR    . PANENDOSCOPY N/A 03/04/2017   Procedure: PANENDOSCOPY WITH POSSIBLE FOREIGN BODY REMOVAL;  Surgeon: Jodi Marble, MD;  Location: [facility];  Service: ENT;  Laterality: N/A;  .  ROTATOR CUFF REPAIR Left   . ROTATOR CUFF REPAIR Right 07/27/2019  . SHOULDER ARTHROSCOPY WITH SUBACROMIAL DECOMPRESSION, ROTATOR CUFF REPAIR AND BICEP TENDON REPAIR Right 07/28/2019   Procedure: RIGHT SHOULDER ARTHROSCOP, MINI OPEN ROTATOR CUFF TEAR REPAIR,  BICEPS TENODESIS, DISTAL CLAVICLE EXCISION;  Surgeon: Meredith Pel, MD;  Location: [facility];  Service: Orthopedics;  Laterality: Right;  . TRACHEOSTOMY  02/2017  . VESICOVAGINAL FISTULA CLOSURE W/ TAH  2009     OB History    Gravida  4   Para  3   Term  3   Preterm      AB  1   Living  3     SAB      TAB      Ectopic  1   Multiple      Live Births  3            Family History  Problem Relation Age of Onset  . Heart attack Mother   . Stroke Mother   . Diabetes Mother   . Hypertension Mother   . Arthritis Mother   . Stroke Father   . Hypertension Sister   . Diabetes Sister   . Seizures Brother   . Diabetes Brother   . Colon cancer Neg Hx   . Rectal cancer Neg Hx   . Stomach cancer Neg Hx     Social History   Tobacco Use  . Smoking status: Former Smoker    Packs/day: 0.25    Years: 22.00    Pack years: 5.50    Types: Cigarettes    Quit date: 01/20/2017    Years since quitting: 3.6  . Smokeless tobacco: Never Used  Vaping Use  . Vaping Use: Never used  Substance Use Topics  . Alcohol use: Yes    Alcohol/week: 0.0 standard drinks    Comment: social wine  . Drug use: Yes    Types: Marijuana    Comment: Last Use In July 2021    Home Medications Prior to Admission medications   Medication Sig Start Date End Date Taking? Authorizing Provider  acetaminophen (TYLENOL) 325 MG tablet Take 1-2 tablets (325-650 mg total) by mouth every 6 (six) hours as needed for mild pain (pain score 1-3 or temp > 100.5). 03/15/20   [name], MD  albuterol (PROAIR HFA) 108 (90 Base) MCG/ACT inhaler Inhale 2 puffs into the lungs every 6 (six) hours as needed for wheezing or shortness of breath. 07/11/20   [name], MD  albuterol (PROVENTIL) (2.5 MG/3ML) 0.083% nebulizer solution Take 3 mLs (2.5 mg total) by nebulization every 6 (six) hours as needed for wheezing or shortness of breath. 06/18/20   [name], MD  amLODipine (NORVASC) 10 MG tablet Take 1 tablet (10 mg total) by mouth daily. 07/11/20   [name], MD  atorvastatin (LIPITOR) 20 MG tablet Take 1 tablet (20 mg total) by mouth daily. 07/11/20   [name], MD  Blood Glucose Monitoring Suppl (ONETOUCH VERIO) w/Device KIT 1 each by Does not apply route daily. Use to monitor glucose levels daily; E11.9 04/14/19   Renato Shin, MD  buPROPion [facility] SR) 150 MG 12 hr  tablet Take 1 tablet (150 mg total) by mouth 2 (two) times daily. 07/11/20   [name], MD  carvedilol (COREG) 25 MG tablet Take 1 tablet (25 mg total) by mouth 2 (two) times daily with a meal. 07/11/20   [name], MD  clotrimazole-betamethasone (LOTRISONE) cream Apply 1 application topically 3 (  three) times daily as needed. For rash 03/20/20   Renato Shin, MD  dicyclomine (BENTYL) 20 MG tablet Take 1 tablet (20 mg total) by mouth 3 (three) times daily before meals. 08/10/20   Orpah Greek, MD  furosemide (LASIX) 20 MG tablet Take 1 tablet (20 mg total) by mouth daily. 03/31/19   [name], MD  gabapentin (NEURONTIN) 300 MG capsule Take 2 capsules (600 mg total) by mouth 2 (two) times daily. 10/29/18   Domenic Polite, MD  glucose blood [facility] VERIO) test strip Use to monitor glucose levels daily; E11.9 09/14/19   Renato Shin, MD  HYDROcodone-acetaminophen (NORCO/VICODIN) 5-325 MG tablet Take 1 tablet by mouth daily as needed for moderate pain. Patient not taking: Reported on 07/11/2020 11/02/19   Magnant, Gerrianne Scale, PA-C  hydrOXYzine (ATARAX/VISTARIL) 25 MG tablet Take 1 tablet (25 mg total) by mouth 3 (three) times daily as needed for anxiety. 07/11/20   [name], MD  Insulin Degludec (TRESIBA FLEXTOUCH) 200 UNIT/ML SOPN Inject 140 Units into the skin daily. 07/11/19   Renato Shin, MD  Insulin Pen Needle (FIFTY50 PEN NEEDLES) 31G X 8 MM MISC Inject 1 Units as directed daily. 07/30/18   [provider]  lisinopril (ZESTRIL) 20 MG tablet Take 1 tablet (20 mg total) by mouth daily. 07/11/20   [name], MD  Melatonin 3 MG TABS Take 3 mg by mouth at bedtime as needed (for sleep).  10/31/17   [provider]  Misc. Devices MISC Nebulizer device. Diagnosis - Asthma 07/16/20   [name], MD  mometasone-formoterol (DULERA) 200-5 MCG/ACT AERO Inhale 2 puffs into the lungs in the morning and at bedtime. 07/11/20   [name], MD   montelukast (SINGULAIR) 10 MG tablet Take 1 tablet (10 mg total) by mouth at bedtime. 07/11/20   [name], MD  NARCAN 4 MG/0.1ML LIQD nasal spray kit SMARTSIG:1 Both Nares PRN 04/18/20   [provider]  ondansetron (ZOFRAN) 4 MG tablet Take 1 tablet (4 mg total) by mouth every 8 (eight) hours as needed for nausea or vomiting. 08/03/20   [name], MD  OneTouch Delica Lancets 27N MISC 1 each by Does not apply route daily. Use to monitor glucose levels daily; E11.9 04/14/19   Renato Shin, MD  oxyCODONE-acetaminophen (PERCOCET) 10-325 MG tablet Take 1 tablet by mouth 4 (four) times daily as needed. 05/02/20   [provider]  pantoprazole (PROTONIX) 40 MG tablet Take 1 tablet (40 mg total) by mouth 2 (two) times daily. 08/13/20   [name], MD  promethazine (PHENERGAN) 25 MG tablet Take 1 tablet (25 mg total) by mouth every 6 (six) hours as needed for nausea or vomiting. 08/10/20   Pollina, Gwenyth Allegra, MD  Semaglutide, 1 MG/DOSE, (OZEMPIC, 1 MG/DOSE,) 2 MG/1.5ML SOPN Inject 1 mg into the skin once a week. 12/19/19   Renato Shin, MD  sertraline (ZOLOFT) 100 MG tablet Take 1 tablet (100 mg total) by mouth at bedtime. 07/11/20   [name], MD  sucralfate (CARAFATE) 1 g tablet Take 1 tablet (1 g total) by mouth 4 (four) times daily -  with meals and at bedtime. 08/10/20   Orpah Greek, MD  SUMAtriptan (IMITREX) 100 MG tablet At the onset of a migraine; may repeat in 2 hours. Max daily dose 230m 07/11/20   NCharlott Rakes MD  tiZANidine (ZANAFLEX) 4 MG tablet Take 1 tablet (4 mg total) by mouth every 8 (eight) hours as needed for muscle spasms. 07/11/20   Newlin,  Enobong, MD  topiramate (TOPAMAX) 100 MG tablet Take 1 tablet (100 mg total) by mouth 2 (two) times daily. 07/11/20   [name], MD  fluticasone-salmeterol (ADVAIR HFA) 230-21 MCG/ACT inhaler Inhale 2 puffs into the lungs 2 (two) times daily. 10/15/17 10/15/17  [name], MD     Allergies    Reglan [metoclopramide]  Review of Systems   Review of Systems  Constitutional: Positive for fever.  Respiratory: Positive for cough, shortness of breath and wheezing.   All other systems reviewed and are negative.   Physical Exam Updated Vital Signs BP (!) 145/83   Pulse (!) (P) 132   Temp 98.6 F (37 C) (Oral)   Resp 20   LMP  (LMP Unknown)   SpO2 100%   Physical Exam Vitals and nursing note reviewed.  Constitutional:      General: She is in acute distress.     Appearance: She is obese. She is ill-appearing.  HENT:     Head: Normocephalic and atraumatic.  Eyes:     Extraocular Movements: Extraocular movements intact.     Pupils: Pupils are equal, round, and reactive to light.  Neck:     Comments: Trach in place + secretions Cardiovascular:     Rate and Rhythm: Regular rhythm. Tachycardia present.  Pulmonary:     Effort: Tachypnea present.     Breath sounds: Wheezing present.     Comments: Increased secretions from trach Abdominal:     General: Bowel sounds are normal.     Palpations: Abdomen is soft.  Musculoskeletal:        General: Normal range of motion.     Cervical back: Normal range of motion and neck supple.  Skin:    General: Skin is warm.     Capillary Refill: Capillary refill takes less than 2 seconds.  Neurological:     General: No focal deficit present.     Mental Status: She is alert and oriented to person, place, and time.  Psychiatric:        Mood and Affect: Mood is anxious.        Behavior: Behavior normal.     ED Results / Procedures / Treatments   Labs (all labs ordered are listed, but only abnormal results are displayed) Labs Reviewed  BASIC METABOLIC PANEL - Abnormal; Notable for the following components:      Result Value   Glucose, Bld 203 (*)    Creatinine, Ser 1.07 (*)    All other components within normal limits  CBC - Abnormal; Notable for the following components:   Hemoglobin 11.8 (*)    HCT 35.5 (*)     Platelets 404 (*)    All other components within normal limits  RESP PANEL BY RT-PCR (FLU A&B, COVID) ARPGX2  BRAIN NATRIURETIC PEPTIDE  I-STAT BETA HCG BLOOD, ED ([facility], [facility], [facility] ONLY)  TROPONIN I (HIGH SENSITIVITY)  TROPONIN I (HIGH SENSITIVITY)    EKG EKG Interpretation  Date/Time:  Sunday August 26 2020 16:00:41 EST Ventricular Rate:  117 PR Interval:  152 QRS Duration: 84 QT Interval:  328 QTC Calculation: 457 R Axis:   0 Text Interpretation: Sinus tachycardia Cannot rule out Anterior infarct , age undetermined Abnormal ECG No significant change since last tracing Confirmed by Isla Pence [phone]) on 08/26/2020 5:00:11 PM   Radiology CT Angio Chest PE W and/or Wo Contrast  Result Date: 08/26/2020 CLINICAL DATA:  Short of breath. Blood streaked sputum. Centralized chest pain for 1 week. EXAM: CT  ANGIOGRAPHY CHEST WITH CONTRAST TECHNIQUE: Multidetector CT imaging of the chest was performed using the standard protocol during bolus administration of intravenous contrast. Multiplanar CT image reconstructions and MIPs were obtained to evaluate the vascular anatomy. CONTRAST:  64m OMNIPAQUE IOHEXOL 350 MG/ML SOLN COMPARISON:  03/22/2019.  Current chest radiograph. FINDINGS: Cardiovascular: Pulmonary arteries are well opacified. There is no evidence of a pulmonary embolism. Heart is normal in size and configuration. No pericardial effusion. No coronary artery calcifications. Normal great vessels. Mediastinum/Nodes: Well-positioned tracheostomy tube stable from the prior exam. No neck base, axillary, mediastinal or hilar masses or enlarged lymph nodes. Trachea and esophagus are unremarkable. Lungs/Pleura: Lungs are clear. No pleural effusion or pneumothorax. Upper Abdomen: Fatty infiltration of the liver.  No acute findings. Musculoskeletal: No fracture or acute finding. No bone lesion. No chest wall masses. Review of the MIP images confirms the above findings. IMPRESSION: 1. No evidence of  a pulmonary embolism. 2. No acute findings.  Clear lungs. Electronically Signed   By: DLajean ManesM.D.   On: 08/26/2020 18:55   DG Chest Portable 1 View  Result Date: 08/26/2020 CLINICAL DATA:  Chest pain and shortness of breath. Bloody sputum coming from tracheostomy. EXAM: PORTABLE CHEST 1 VIEW COMPARISON:  March 25, 2019 FINDINGS: The tracheostomy tube is stable. The cardiomediastinal silhouette is unchanged. No pneumothorax. No nodules or masses. No focal infiltrates. IMPRESSION: No active disease. Electronically Signed   By: DDorise BullionIII M.D   On: 08/26/2020 17:12    Procedures Procedures (including critical care time)  Medications Ordered in ED Medications  albuterol (PROVENTIL,VENTOLIN) solution continuous neb (10 mg/hr Nebulization New Bag/Given 08/26/20 1838)  morphine 4 MG/ML injection 4 mg (has no administration in time range)  dexamethasone (DECADRON) injection 10 mg (10 mg Intravenous Given 08/26/20 1645)  LORazepam (ATIVAN) injection 0.5 mg (0.5 mg Intravenous Given 08/26/20 1646)  albuterol (VENTOLIN HFA) 108 (90 Base) MCG/ACT inhaler 2 puff (2 puffs Inhalation Given 08/26/20 1651)  AeroChamber Plus Flo-Vu Large MISC 1 each (1 each Other Given 08/26/20 1658)  morphine 4 MG/ML injection 4 mg (4 mg Intravenous Given 08/26/20 1929)  ondansetron (ZOFRAN) injection 4 mg (4 mg Intravenous Given 08/26/20 1930)  iohexol (OMNIPAQUE) 350 MG/ML injection 100 mL (75 mLs Intravenous Contrast Given 08/26/20 1823)    ED Course  I have reviewed the triage vital signs and the nursing notes.  Pertinent labs & imaging results that were available during my care of the patient were reviewed by me and considered in my medical decision making (see chart for details).    MDM Rules/Calculators/A&P                          Trach suctioned upon arrival.  No mucous plug.  Pt given solumedrol and albuterol inhaler.  Covid test was negative, then pt given a continuous neb.  She still remains sob.   BS are more clear; however.  She did have a CTA as CXR was clear and she was tachycardic and she had sob with right sided cp.  This was negative for PE or pna.  She is likely tachycardic due to the difficulty breathing.   Pt d/w FP residents for admission.  CRITICAL CARE Performed by: JIsla Pence  Total critical care time: 30 minutes  Critical care time was exclusive of separately billable procedures and treating other patients.  Critical care was necessary to treat or prevent imminent or life-threatening deterioration.  Critical care was  time spent personally by me on the following activities: development of treatment plan with patient and/or surrogate as well as nursing, discussions with consultants, evaluation of patient's response to treatment, examination of patient, obtaining history from patient or surrogate, ordering and performing treatments and interventions, ordering and review of laboratory studies, ordering and review of radiographic studies, pulse oximetry and re-evaluation of patient's condition.  [name] was evaluated in Emergency Department on 08/26/2020 for the symptoms described in the history of present illness. She was evaluated in the context of the global COVID-19 pandemic, which necessitated consideration that the patient might be at risk for infection with the SARS-CoV-2 virus that causes COVID-19. Institutional protocols and algorithms that pertain to the evaluation of patients at risk for COVID-19 are in a state of rapid change based on information released by regulatory bodies including the CDC and federal and state organizations. These policies and algorithms were followed during the patient's care in the ED.  Final Clinical Impression(s) / ED Diagnoses Final diagnoses:  Moderate persistent asthma with exacerbation    Rx / DC Orders ED Discharge Orders    None       Isla Pence, MD 08/26/20 2011

## 2020-08-26 NOTE — H&P (Addendum)
Family Medicine Teaching [name] Admission History and Physical Service Pager: [phone]  Patient name: [name] Medical record number: [id number] Date of birth: [birth year] Age: 49 y.o. Gender: female  Primary Care Provider: Hoy Register, MD Consultants: None Code Status: Full Preferred Emergency Contact: Husband Charles, phone number in chart  Chief Complaint: "Can't breathe, blood clots in my trach"  Assessment and Plan: [name] is a 49 y.o. female presenting with dyspnea, found to be tachycardiac and tachypnic . PMH is significant for severe asthma, subglottic stenosis and tracheal stenosis s/p tracheostomy (current), diabetes, hypertension, chronic cough, OSA, h/o normocytic anemia, h/o gastritis and gastroduodenitis, generalized anxiety disorder, depression, morbid obesity, chronic pain syndrome, chronic bilateral low back pain, carpal tunnel syndrome, dysphonia.  Tachypnea, increased sputum production likely 2/2 asthma exacerbation Patient presented with dyspnea, increased sputum production, some blood streaked sputum x3 days. Found to be tachycardic with HR 118-139 and tachypneic with RR 17-23.  Note significant history of severe asthma with prior intubation in 2018 and subsequent ICU admission and trach placement.  ED found patient to be negative for Covid and flu A/B.  Chest x-ray demonstrated stable tracheostomy tube and no active disease.  CTA chest negative for PE, demonstrated no acute findings. Received 2 continuous albuterol nebulizers in ED.  Also s/p albuterol inhaler, Decadron IV 10 mg, Ativan, morphine 4 mg x 2, Zofran in ED. Differential includes asthma exacerbation versus PE.  Asthma exacerbation much more likely given significant history along with negative Covid and flu; however, lung sounds clear to auscultation with good air movement. EDP reports much improved wheezing with albuterol treatments, but still significant  dyspneic symptoms.  PE much more congruent with clinical picture of tachycardia and tachypnea, however CTA PE study negative (though angiography is gold standard for diagnosis-- given improvement of symptoms since ED arrival, will not obtain at this time). Can also consider heart failure, BNP pending. Pneumonia unlikely without fever or leukocytosis. Will obtain CBC with diff for AM labs. No empiric antibiotics provided In ED.  Can also consider psychogenic causes. No foreign body appreciated on imaging. Given chronic trach, considering more common complications such as trachea narrowing, fistula formation, (though these would be evident on CT imaging), and viral tracheobronchitis,  -Admit to MedSurg with Dr. Manson Passey attending -Follow-up ABG -Vitals per unit routine -Continuous pulse ox -Diet n.p.o. plus sips-with-meds and ice chips -Continue home asthma medications as below -Consider echocardiogram during admission  -Consider ENT consult in the AM for further considerations in setting of chornic tracheostomy   Asthma  Subglottic stenosis and tracheal stenosis s/p tracheostomy (current)  chronic cough  At home, takes Albuterol inhaler 2 puffs every 6 as needed, albuterol nebulizer every 6 as needed, Dulera 2 puffs twice daily, Singulair 10 mg tablet nightly. Tracheostomy is managed with ENT at [facility].  -Continue home meds   Diabetes Type I, well controlled POC glucose on admission 203. Home meds include Tresiba 140 units subcu daily and Ozempic 1 mg once weekly. Will be receiving steroids as part of asthma exacerbation treatment, likely to raise glucose.  -Administer 70 units Lantus nightly (half of home dose) -Moderate sliding scale insulin -QHS adjustment -Q4 CBGs while NPO  Hypertension BP on admission 113-170/63-113, pulse is 118-136; last 113/63 with pulse 136. Home meds include amlodipine 10 mg daily, Coreg 25 mg twice daily, lisinopril 20 mg daily, Lasix 20 mg daily. No signs of  fluid overload on exam. No BLEE -Continue amlodipine, Coreg, lisinopril -Holding home lasix while  inpatient, will reevaluate daily  OSA Patient reports she has not worn CPAP since trach placement in 2018. -Offer trach appropriate CPAP if requested  History of normocytic anemia  h/o gastritis and gastroduodenitis Admission CBC found hemoglobin 11.8 with MCV 86.8.  Stable compared to CBC 11/18 with hemoglobin 12.2.  At home takes Bentyl 20 mg 3 times daily before meals, Protonix 40 mg twice daily, and Carafate 1 g 3 times daily before meals and also at bedtime.  -Transfusion threshold hemoglobin < 7 -Follow-up a.m. CBC -Continue Bentyl, Protonix, Carafate  Generalized anxiety disorder  Depression At home takes bupropion 150 mg 12-hour tablet twice daily, Zoloft 100 mg at bedtime, hydroxyzine 25 mg 3 times daily as needed, and melatonin 3 mg at bedtime. -Continue home meds as above  Chronic pain syndrome  chronic bilateral low back pain  carpal tunnel syndrome At home takes Percocet 10-325 mg 4 times daily as needed, gabapentin 600 mg twice daily, tizanidine 4 mg every 8 as needed.  -Continue home meds at reduced BID dosing given acute respiratory issue  -Plan to only decrease pain meds if concern for oversedation  Hyperlipidemia At home takes atorvastatin 20 mg daily. -Continue atorvastatin  Migraines At home, takes sumatriptan 100 mg at onset of migraine with repeat in 2 hours.  Also takes topiramate 100 mg twice daily. -Continue topiramate  FEN/GI: N.p.o. plus sips with meds and ice chips Prophylaxis: Lovenox  Disposition: MedSurg  History of Present Illness:  [name] is a 49 y.o. female presenting with dyspnea, increased sputum production, mild fever, sweats and chills x 3 days. Patient reports that she has been coughing so much that she is now having pain in her back, ribs, and around her trach site.  Also having nausea/vomiting for the last couple of  days. Patient reported "blood clots" in her trach, but the trach tubes she presented [place] from her purse appeared to have blood-tinged sputum with grossly patent tubes. Photos of the tracheostomy tubes are below in the physical exam portion. She also reports coughing up a large amount of sputum and presented the below picture on her phone. Admission interview truncated because dyspnea and tachycardia worsened with speaking.       Review Of Systems: Per HPI with the following additions:   Review of Systems  Constitutional: Positive for fatigue and fever.  HENT: Negative for congestion and rhinorrhea.   Respiratory: Positive for cough, choking, chest tightness and shortness of breath.   Cardiovascular: Positive for chest pain. Negative for palpitations.  Gastrointestinal: Positive for nausea and vomiting.  Musculoskeletal: Positive for back pain.  Neurological: Positive for headaches.     Patient Active Problem List   Diagnosis Date Noted  . Dyspnea 08/26/2020  . Rotator cuff tear 07/28/2019  . Diabetes (HCC) 04/12/2019  . Acute asthma exacerbation 03/22/2019  . Chest pain of uncertain etiology 03/22/2019  . Lactic acidosis 03/22/2019  . Chronic right shoulder pain   . Community acquired pneumonia 10/25/2018  . Asthma, chronic, unspecified asthma severity, with acute exacerbation 10/25/2018  . Acute respiratory failure with hypoxia and hypercapnia (HCC) 10/23/2018  . Acute pain of right shoulder due to trauma 10/05/2018  . Chronic bilateral low back pain without sciatica 10/05/2018  . Dysphonia 10/05/2018  . Severe asthma with exacerbation 10/05/2018  . Rotator cuff arthropathy, right 06/15/2018  . AKI (acute kidney injury) (HCC) 03/06/2018  . Carpal tunnel syndrome 01/18/2018  . Normocytic anemia 05/06/2017  . Tracheostomy status (HCC) 04/28/2017  . Tracheostomy  dependent (HCC) 03/26/2017  . Subglottic stenosis 03/06/2017  . Tracheal stenosis 03/04/2017  . Acute blood loss  anemia   . Generalized anxiety disorder   . Morbid obesity (HCC)   . Chronic pain syndrome   . Gastritis and gastroduodenitis 02/22/2016  . Depression 02/22/2016  . Migraine 01/30/2015  . Bulging lumbar disc 09/05/2013  . Essential hypertension 09/05/2013  . Allergic rhinitis, seasonal 08/11/2012  . Chronic cough 08/11/2012  . Sleep apnea, obstructive 12/04/2011    Past Medical History: Past Medical History:  Diagnosis Date  . Allergy   . Anxiety   . Arthritis   . Asthma   . Chronic back pain   . Chronic chest pain    resolved, no problems since 2019 per patient 07/27/19  . COPD (chronic obstructive pulmonary disease) (HCC)   . Depression   . DM (diabetes mellitus) (HCC)    INSULIN DEPENDENT - TYPE 1  . GERD (gastroesophageal reflux disease)   . Headache(784.0)   . Hyperlipidemia    no med, diet controlled  . Hypertension   . Hypokalemia   . Respiratory disease 05/2017   uses inhalers, neb tx prn, no oxygen  . Sleep apnea    does not use CPAP due to trach  . Tracheostomy in place [facility]) 02/2017    Past Surgical History: Past Surgical History:  Procedure Laterality Date  . ABDOMINAL HYSTERECTOMY  2009  . APPENDECTOMY    . CESAREAN SECTION     x 3  . CHOLECYSTECTOMY N/A 03/02/2014   Procedure: LAPAROSCOPIC CHOLECYSTECTOMY;  Surgeon: Clovis Pu. Cornett, MD;  Location: [facility] OR;  Service: General;  Laterality: N/A;  . HERNIA REPAIR    . PANENDOSCOPY N/A 03/04/2017   Procedure: PANENDOSCOPY WITH POSSIBLE FOREIGN BODY REMOVAL;  Surgeon: Flo Shanks, MD;  Location[name] OR;  Service: ENT;  Laterality: N/A;  . ROTATOR CUFF REPAIR Left   . ROTATOR CUFF REPAIR Right 07/27/2019  . SHOULDER ARTHROSCOPY WITH SUBACROMIAL DECOMPRESSION, ROTATOR CUFF REPAIR AND BICEP TENDON REPAIR Right 07/28/2019   Procedure: RIGHT SHOULDER ARTHROSCOP, MINI OPEN ROTATOR CUFF TEAR REPAIR,  BICEPS TENODESIS, DISTAL CLAVICLE EXCISION;  Surgeon: Cammy Copa, MD;  Location: [facility] OR;  Service:  Orthopedics;  Laterality: Right;  . TRACHEOSTOMY  02/2017  . VESICOVAGINAL FISTULA CLOSURE W/ TAH  2009    Social History: Social History   Tobacco Use  . Smoking status: Former Smoker    Packs/day: 0.25    Years: 22.00    Pack years: 5.50    Types: Cigarettes    Quit date: 01/20/2017    Years since quitting: 3.6  . Smokeless tobacco: Never Used  Vaping Use  . Vaping Use: Never used  Substance Use Topics  . Alcohol use: Yes    Alcohol/week: 0.0 standard drinks    Comment: social wine  . Drug use: Yes    Types: Marijuana    Comment: Last Use In July 2021   Please also refer to relevant sections of EMR.  Family History: Family History  Problem Relation Age of Onset  . Heart attack Mother   . Stroke Mother   . Diabetes Mother   . Hypertension Mother   . Arthritis Mother   . Stroke Father   . Hypertension Sister   . Diabetes Sister   . Seizures Brother   . Diabetes Brother   . Colon cancer Neg Hx   . Rectal cancer Neg Hx   . Stomach cancer Neg Hx     Allergies  and Medications: Allergies  Allergen Reactions  . Reglan [Metoclopramide] Other (See Comments)    Panic attack   Current Facility-Administered Medications on File Prior to Encounter  Medication Dose Route Frequency Provider Last Rate Last Admin  . 0.9 %  sodium chloride infusion  500 mL Intravenous Once Hilarie Fredrickson, MD       Current Outpatient Medications on File Prior to Encounter  Medication Sig Dispense Refill  . acetaminophen (TYLENOL) 325 MG tablet Take 1-2 tablets (325-650 mg total) by mouth every 6 (six) hours as needed for mild pain (pain score 1-3 or temp > 100.5). 60 tablet 0  . albuterol (PROAIR HFA) 108 (90 Base) MCG/ACT inhaler Inhale 2 puffs into the lungs every 6 (six) hours as needed for wheezing or shortness of breath. 18 g 6  . albuterol (PROVENTIL) (2.5 MG/3ML) 0.083% nebulizer solution Take 3 mLs (2.5 mg total) by nebulization every 6 (six) hours as needed for wheezing or shortness of  breath. 75 mL 3  . amLODipine (NORVASC) 10 MG tablet Take 1 tablet (10 mg total) by mouth daily. 90 tablet 1  . atorvastatin (LIPITOR) 20 MG tablet Take 1 tablet (20 mg total) by mouth daily. 90 tablet 1  . Blood Glucose Monitoring Suppl (ONETOUCH VERIO) w/Device KIT 1 each by Does not apply route daily. Use to monitor glucose levels daily; E11.9 1 kit 0  . buPROPion (WELLBUTRIN SR) 150 MG 12 hr tablet Take 1 tablet (150 mg total) by mouth 2 (two) times daily. 180 tablet 1  . carvedilol (COREG) 25 MG tablet Take 1 tablet (25 mg total) by mouth 2 (two) times daily with a meal. 180 tablet 1  . clotrimazole-betamethasone (LOTRISONE) cream Apply 1 application topically 3 (three) times daily as needed. For rash 45 g 2  . dicyclomine (BENTYL) 20 MG tablet Take 1 tablet (20 mg total) by mouth 3 (three) times daily before meals. 30 tablet 0  . furosemide (LASIX) 20 MG tablet Take 1 tablet (20 mg total) by mouth daily. 30 tablet 2  . gabapentin (NEURONTIN) 300 MG capsule Take 2 capsules (600 mg total) by mouth 2 (two) times daily. 360 capsule 1  . glucose blood (ONETOUCH VERIO) test strip Use to monitor glucose levels daily; E11.9 100 each 2  . hydrOXYzine (ATARAX/VISTARIL) 25 MG tablet Take 1 tablet (25 mg total) by mouth 3 (three) times daily as needed for anxiety. 180 tablet 1  . Insulin Degludec (TRESIBA FLEXTOUCH) 200 UNIT/ML SOPN Inject 140 Units into the skin daily. 12 pen 11  . Insulin Pen Needle (FIFTY50 PEN NEEDLES) 31G X 8 MM MISC Inject 1 Units as directed daily.    Marland Kitchen lisinopril (ZESTRIL) 20 MG tablet Take 1 tablet (20 mg total) by mouth daily. 90 tablet 1  . Melatonin 3 MG TABS Take 3 mg by mouth at bedtime as needed (for sleep).     . mometasone-formoterol (DULERA) 200-5 MCG/ACT AERO Inhale 2 puffs into the lungs in the morning and at bedtime. 1 each 6  . montelukast (SINGULAIR) 10 MG tablet Take 1 tablet (10 mg total) by mouth at bedtime. 90 tablet 1  . ondansetron (ZOFRAN) 4 MG tablet Take 1  tablet (4 mg total) by mouth every 8 (eight) hours as needed for nausea or vomiting. 30 tablet 1  . OneTouch Delica Lancets 33G MISC 1 each by Does not apply route daily. Use to monitor glucose levels daily; E11.9 100 each 2  . oxyCODONE-acetaminophen (PERCOCET) 10-325 MG tablet Take  1 tablet by mouth 4 (four) times daily as needed for pain.     . pantoprazole (PROTONIX) 40 MG tablet Take 1 tablet (40 mg total) by mouth 2 (two) times daily. 60 tablet 0  . promethazine (PHENERGAN) 25 MG tablet Take 1 tablet (25 mg total) by mouth every 6 (six) hours as needed for nausea or vomiting. 15 tablet 0  . Semaglutide, 1 MG/DOSE, (OZEMPIC, 1 MG/DOSE,) 2 MG/1.5ML SOPN Inject 1 mg into the skin once a week. 2 pen 11  . sertraline (ZOLOFT) 100 MG tablet Take 1 tablet (100 mg total) by mouth at bedtime. 90 tablet 1  . sucralfate (CARAFATE) 1 g tablet Take 1 tablet (1 g total) by mouth 4 (four) times daily -  with meals and at bedtime. 40 tablet 0  . SUMAtriptan (IMITREX) 100 MG tablet At the onset of a migraine; may repeat in 2 hours. Max daily dose 200mg  (Patient taking differently: Take 100 mg by mouth every 2 (two) hours as needed for migraine. ) 20 tablet 6  . tiZANidine (ZANAFLEX) 4 MG tablet Take 1 tablet (4 mg total) by mouth every 8 (eight) hours as needed for muscle spasms. 90 tablet 6  . topiramate (TOPAMAX) 100 MG tablet Take 1 tablet (100 mg total) by mouth 2 (two) times daily. 180 tablet 1  . Misc. Devices MISC Nebulizer device. Diagnosis - Asthma 1 each 0  . [DISCONTINUED] fluticasone-salmeterol (ADVAIR HFA) 230-21 MCG/ACT inhaler Inhale 2 puffs into the lungs 2 (two) times daily. 1 Inhaler 3    Objective: BP 109/84   Pulse (!) 141   Temp 98.6 F (37 C) (Oral)   Resp (!) 23   LMP  (LMP Unknown)   SpO2 92%  Exam: General: Awake, alert, oriented, moderate distress Eyes: EOM intact ENTM: Intact dentition, no lesions, uvula midline Cardiovascular: Tachycardic around 120 bpm, regular rhythm, S1  and S2 auscultated, no murmurs appreciated Respiratory: Trace wheezing in superior fields Neuro: Cranial nerves II through X grossly intact, moving all extremities spontaneously Tracheostomy tubes as pictured below. Patient reported "blood clots" in her trach, but the trach tubes she presented [place] from her purse appeared to have blood-tinged sputum with grossly patent tubes.         Labs and Imaging: CBC BMET  Recent Labs  Lab 08/26/20 1618  WBC 7.2  HGB 11.8*  HCT 35.5*  PLT 404*   Recent Labs  Lab 08/26/20 1618  NA 138  K 4.3  CL 104  CO2 23  BUN 9  CREATININE 1.07*  GLUCOSE 203*  CALCIUM 9.0     EKG: Sinus tachycardia at 170 bpm, normal axis, appropriate R wave progression in V1-6, no ST elevation  PORTABLE CHEST 1 VIEW 08/26/2020 COMPARISON:  March 25, 2019 FINDINGS: The tracheostomy tube is stable. The cardiomediastinal silhouette is unchanged. No pneumothorax. No nodules or masses. No focal infiltrates. IMPRESSION: No active disease.  CT ANGIOGRAPHY CHEST WITH CONTRAST 08/26/2020 TECHNIQUE: Multidetector CT imaging of the chest was performed using the standard protocol during bolus administration of intravenous contrast. Multiplanar CT image reconstructions and MIPs were obtained to evaluate the vascular anatomy. CONTRAST:  75mL OMNIPAQUE IOHEXOL 350 MG/ML SOLN COMPARISON:  03/22/2019.  Current chest radiograph. FINDINGS: Cardiovascular: Pulmonary arteries are well opacified. There is no evidence of a pulmonary embolism.  Heart is normal in size and configuration. No pericardial effusion. No coronary artery calcifications. Normal great vessels.  Mediastinum/Nodes: Well-positioned tracheostomy tube stable from the prior exam. No neck base, axillary,  mediastinal or hilar masses or enlarged lymph nodes. Trachea and esophagus are unremarkable.  Lungs/Pleura: Lungs are clear. No pleural effusion or pneumothorax.  Upper Abdomen: Fatty infiltration of  the liver.  No acute findings.  Musculoskeletal: No fracture or acute finding. No bone lesion. No chest wall masses.  Review of the MIP images confirms the above findings.  IMPRESSION: 1. No evidence of a pulmonary embolism. 2. No acute findings.  Clear lungs.  Fayette Pho, MD 08/26/2020, 11:29 PM PGY-1, [facility] Family Medicine FPTS Intern pager: [phone], text pages welcome  FPTS Upper-Level Resident Addendum I have independently interviewed and examined the patient. I have discussed the above with the original author and agree with their documentation. My edits for correction/addition/clarification are in - dark green. Please see also any attending notes.  FPTS Service pager: [phone] (text pages welcome through AMION)  Melene Plan, M.D.  PGY-3 08/26/2020 11:40 PM

## 2020-08-27 DIAGNOSIS — Z6841 Body Mass Index (BMI) 40.0 and over, adult: Secondary | ICD-10-CM | POA: Diagnosis not present

## 2020-08-27 DIAGNOSIS — J441 Chronic obstructive pulmonary disease with (acute) exacerbation: Secondary | ICD-10-CM | POA: Diagnosis not present

## 2020-08-27 DIAGNOSIS — N839 Noninflammatory disorder of ovary, fallopian tube and broad ligament, unspecified: Secondary | ICD-10-CM | POA: Diagnosis present

## 2020-08-27 DIAGNOSIS — Z833 Family history of diabetes mellitus: Secondary | ICD-10-CM | POA: Diagnosis not present

## 2020-08-27 DIAGNOSIS — R490 Dysphonia: Secondary | ICD-10-CM | POA: Diagnosis present

## 2020-08-27 DIAGNOSIS — E1322 Other specified diabetes mellitus with diabetic chronic kidney disease: Secondary | ICD-10-CM | POA: Diagnosis not present

## 2020-08-27 DIAGNOSIS — J9503 Malfunction of tracheostomy stoma: Secondary | ICD-10-CM | POA: Diagnosis present

## 2020-08-27 DIAGNOSIS — R0602 Shortness of breath: Secondary | ICD-10-CM | POA: Diagnosis present

## 2020-08-27 DIAGNOSIS — R0789 Other chest pain: Secondary | ICD-10-CM | POA: Diagnosis not present

## 2020-08-27 DIAGNOSIS — E785 Hyperlipidemia, unspecified: Secondary | ICD-10-CM | POA: Diagnosis present

## 2020-08-27 DIAGNOSIS — R0600 Dyspnea, unspecified: Secondary | ICD-10-CM | POA: Diagnosis not present

## 2020-08-27 DIAGNOSIS — G4733 Obstructive sleep apnea (adult) (pediatric): Secondary | ICD-10-CM | POA: Diagnosis not present

## 2020-08-27 DIAGNOSIS — G43909 Migraine, unspecified, not intractable, without status migrainosus: Secondary | ICD-10-CM | POA: Diagnosis present

## 2020-08-27 DIAGNOSIS — I10 Essential (primary) hypertension: Secondary | ICD-10-CM | POA: Diagnosis not present

## 2020-08-27 DIAGNOSIS — Z20822 Contact with and (suspected) exposure to covid-19: Secondary | ICD-10-CM | POA: Diagnosis present

## 2020-08-27 DIAGNOSIS — N1831 Chronic kidney disease, stage 3a: Secondary | ICD-10-CM | POA: Diagnosis present

## 2020-08-27 DIAGNOSIS — Z823 Family history of stroke: Secondary | ICD-10-CM | POA: Diagnosis not present

## 2020-08-27 DIAGNOSIS — E1165 Type 2 diabetes mellitus with hyperglycemia: Secondary | ICD-10-CM | POA: Diagnosis present

## 2020-08-27 DIAGNOSIS — Z8249 Family history of ischemic heart disease and other diseases of the circulatory system: Secondary | ICD-10-CM | POA: Diagnosis not present

## 2020-08-27 DIAGNOSIS — E1122 Type 2 diabetes mellitus with diabetic chronic kidney disease: Secondary | ICD-10-CM | POA: Diagnosis present

## 2020-08-27 DIAGNOSIS — J9620 Acute and chronic respiratory failure, unspecified whether with hypoxia or hypercapnia: Secondary | ICD-10-CM | POA: Diagnosis not present

## 2020-08-27 DIAGNOSIS — F411 Generalized anxiety disorder: Secondary | ICD-10-CM | POA: Diagnosis present

## 2020-08-27 DIAGNOSIS — F32A Depression, unspecified: Secondary | ICD-10-CM | POA: Diagnosis present

## 2020-08-27 DIAGNOSIS — Z930 Tracheostomy status: Secondary | ICD-10-CM | POA: Diagnosis not present

## 2020-08-27 DIAGNOSIS — Z8261 Family history of arthritis: Secondary | ICD-10-CM | POA: Diagnosis not present

## 2020-08-27 DIAGNOSIS — Z87891 Personal history of nicotine dependence: Secondary | ICD-10-CM | POA: Diagnosis not present

## 2020-08-27 DIAGNOSIS — G894 Chronic pain syndrome: Secondary | ICD-10-CM | POA: Diagnosis present

## 2020-08-27 DIAGNOSIS — I129 Hypertensive chronic kidney disease with stage 1 through stage 4 chronic kidney disease, or unspecified chronic kidney disease: Secondary | ICD-10-CM | POA: Diagnosis present

## 2020-08-27 DIAGNOSIS — K219 Gastro-esophageal reflux disease without esophagitis: Secondary | ICD-10-CM | POA: Diagnosis present

## 2020-08-27 DIAGNOSIS — Z794 Long term (current) use of insulin: Secondary | ICD-10-CM | POA: Diagnosis not present

## 2020-08-27 DIAGNOSIS — J4551 Severe persistent asthma with (acute) exacerbation: Secondary | ICD-10-CM | POA: Diagnosis present

## 2020-08-27 LAB — I-STAT ARTERIAL BLOOD GAS, ED
Acid-base deficit: 6 mmol/L — ABNORMAL HIGH (ref 0.0–2.0)
Bicarbonate: 18 mmol/L — ABNORMAL LOW (ref 20.0–28.0)
Calcium, Ion: 1.21 mmol/L (ref 1.15–1.40)
HCT: 36 % (ref 36.0–46.0)
Hemoglobin: 12.2 g/dL (ref 12.0–15.0)
O2 Saturation: 98 %
Patient temperature: 98.7
Potassium: 4.3 mmol/L (ref 3.5–5.1)
Sodium: 136 mmol/L (ref 135–145)
TCO2: 19 mmol/L — ABNORMAL LOW (ref 22–32)
pCO2 arterial: 28.8 mmHg — ABNORMAL LOW (ref 32.0–48.0)
pH, Arterial: 7.404 (ref 7.350–7.450)
pO2, Arterial: 95 mmHg (ref 83.0–108.0)

## 2020-08-27 LAB — CBC WITH DIFFERENTIAL/PLATELET
Abs Immature Granulocytes: 0.07 10*3/uL (ref 0.00–0.07)
Basophils Absolute: 0 10*3/uL (ref 0.0–0.1)
Basophils Relative: 0 %
Eosinophils Absolute: 0 10*3/uL (ref 0.0–0.5)
Eosinophils Relative: 0 %
HCT: 34.8 % — ABNORMAL LOW (ref 36.0–46.0)
Hemoglobin: 11.2 g/dL — ABNORMAL LOW (ref 12.0–15.0)
Immature Granulocytes: 1 %
Lymphocytes Relative: 23 %
Lymphs Abs: 2.6 10*3/uL (ref 0.7–4.0)
MCH: 28 pg (ref 26.0–34.0)
MCHC: 32.2 g/dL (ref 30.0–36.0)
MCV: 87 fL (ref 80.0–100.0)
Monocytes Absolute: 0.5 10*3/uL (ref 0.1–1.0)
Monocytes Relative: 4 %
Neutro Abs: 8.2 10*3/uL — ABNORMAL HIGH (ref 1.7–7.7)
Neutrophils Relative %: 72 %
Platelets: 393 10*3/uL (ref 150–400)
RBC: 4 MIL/uL (ref 3.87–5.11)
RDW: 14 % (ref 11.5–15.5)
WBC: 11.3 10*3/uL — ABNORMAL HIGH (ref 4.0–10.5)
nRBC: 0 % (ref 0.0–0.2)

## 2020-08-27 LAB — HIV ANTIBODY (ROUTINE TESTING W REFLEX): HIV Screen 4th Generation wRfx: NONREACTIVE

## 2020-08-27 LAB — COMPREHENSIVE METABOLIC PANEL
ALT: 25 U/L (ref 0–44)
AST: 18 U/L (ref 15–41)
Albumin: 3.4 g/dL — ABNORMAL LOW (ref 3.5–5.0)
Alkaline Phosphatase: 83 U/L (ref 38–126)
Anion gap: 13 (ref 5–15)
BUN: 18 mg/dL (ref 6–20)
CO2: 20 mmol/L — ABNORMAL LOW (ref 22–32)
Calcium: 9.7 mg/dL (ref 8.9–10.3)
Chloride: 101 mmol/L (ref 98–111)
Creatinine, Ser: 1.29 mg/dL — ABNORMAL HIGH (ref 0.44–1.00)
GFR, Estimated: 51 mL/min — ABNORMAL LOW (ref >60)
Glucose, Bld: 279 mg/dL — ABNORMAL HIGH (ref 70–99)
Potassium: 4.5 mmol/L (ref 3.5–5.1)
Sodium: 134 mmol/L — ABNORMAL LOW (ref 135–145)
Total Bilirubin: 0.4 mg/dL (ref 0.3–1.2)
Total Protein: 7.6 g/dL (ref 6.5–8.1)

## 2020-08-27 LAB — CBG MONITORING, ED: Glucose-Capillary: 227 mg/dL — ABNORMAL HIGH (ref 70–99)

## 2020-08-27 LAB — GLUCOSE, CAPILLARY
Glucose-Capillary: 206 mg/dL — ABNORMAL HIGH (ref 70–99)
Glucose-Capillary: 229 mg/dL — ABNORMAL HIGH (ref 70–99)
Glucose-Capillary: 232 mg/dL — ABNORMAL HIGH (ref 70–99)

## 2020-08-27 LAB — BRAIN NATRIURETIC PEPTIDE: B Natriuretic Peptide: 21.7 pg/mL (ref 0.0–100.0)

## 2020-08-27 MED ORDER — INSULIN ASPART 100 UNIT/ML ~~LOC~~ SOLN
0.0000 [IU] | Freq: Three times a day (TID) | SUBCUTANEOUS | Status: DC
  Administered 2020-08-27 (×3): 3 [IU] via SUBCUTANEOUS
  Administered 2020-08-28: 5 [IU] via SUBCUTANEOUS

## 2020-08-27 MED ORDER — HYDROXYZINE HCL 25 MG PO TABS
25.0000 mg | ORAL_TABLET | Freq: Three times a day (TID) | ORAL | Status: DC | PRN
  Administered 2020-08-27: 25 mg via ORAL
  Filled 2020-08-27 (×2): qty 1

## 2020-08-27 MED ORDER — PANTOPRAZOLE SODIUM 40 MG PO TBEC
40.0000 mg | DELAYED_RELEASE_TABLET | Freq: Two times a day (BID) | ORAL | Status: DC
  Administered 2020-08-27 – 2020-08-29 (×6): 40 mg via ORAL
  Filled 2020-08-27 (×6): qty 1

## 2020-08-27 MED ORDER — OXYCODONE-ACETAMINOPHEN 5-325 MG PO TABS
1.0000 | ORAL_TABLET | Freq: Two times a day (BID) | ORAL | Status: DC | PRN
  Administered 2020-08-27: 1 via ORAL
  Filled 2020-08-27: qty 1

## 2020-08-27 MED ORDER — ENOXAPARIN SODIUM 40 MG/0.4ML ~~LOC~~ SOLN
40.0000 mg | Freq: Every day | SUBCUTANEOUS | Status: DC
  Administered 2020-08-27 – 2020-08-28 (×2): 40 mg via SUBCUTANEOUS
  Filled 2020-08-27 (×4): qty 0.4

## 2020-08-27 MED ORDER — ATORVASTATIN CALCIUM 10 MG PO TABS
20.0000 mg | ORAL_TABLET | Freq: Every day | ORAL | Status: DC
  Administered 2020-08-27 – 2020-08-29 (×3): 20 mg via ORAL
  Filled 2020-08-27 (×3): qty 2

## 2020-08-27 MED ORDER — AMLODIPINE BESYLATE 10 MG PO TABS
10.0000 mg | ORAL_TABLET | Freq: Every day | ORAL | Status: DC
  Administered 2020-08-27 – 2020-08-29 (×3): 10 mg via ORAL
  Filled 2020-08-27 (×3): qty 1

## 2020-08-27 MED ORDER — OXYCODONE-ACETAMINOPHEN 10-325 MG PO TABS: 1.0000 | ORAL_TABLET | Freq: Two times a day (BID) | ORAL | Status: DC | PRN

## 2020-08-27 MED ORDER — MENTHOL 3 MG MT LOZG
1.0000 | LOZENGE | OROMUCOSAL | Status: DC | PRN
  Filled 2020-08-27: qty 9

## 2020-08-27 MED ORDER — INSULIN GLARGINE 100 UNIT/ML ~~LOC~~ SOLN
70.0000 [IU] | Freq: Every day | SUBCUTANEOUS | Status: DC
  Administered 2020-08-27 – 2020-08-28 (×2): 70 [IU] via SUBCUTANEOUS
  Filled 2020-08-27 (×2): qty 0.7

## 2020-08-27 MED ORDER — ALBUTEROL SULFATE (2.5 MG/3ML) 0.083% IN NEBU
5.0000 mg | INHALATION_SOLUTION | Freq: Four times a day (QID) | RESPIRATORY_TRACT | Status: DC
  Administered 2020-08-27: 2.5 mg via RESPIRATORY_TRACT
  Administered 2020-08-27 (×2): 5 mg via RESPIRATORY_TRACT
  Administered 2020-08-28 (×3): 2.5 mg via RESPIRATORY_TRACT
  Administered 2020-08-28 – 2020-08-29 (×4): 5 mg via RESPIRATORY_TRACT
  Filled 2020-08-27 (×11): qty 6

## 2020-08-27 MED ORDER — ACETAMINOPHEN 325 MG PO TABS
650.0000 mg | ORAL_TABLET | Freq: Four times a day (QID) | ORAL | Status: DC | PRN
  Administered 2020-08-27 – 2020-08-29 (×2): 650 mg via ORAL
  Filled 2020-08-27 (×2): qty 2

## 2020-08-27 MED ORDER — OXYCODONE HCL 5 MG PO TABS
10.0000 mg | ORAL_TABLET | Freq: Four times a day (QID) | ORAL | Status: DC | PRN
  Administered 2020-08-27 – 2020-08-29 (×8): 10 mg via ORAL
  Filled 2020-08-27 (×8): qty 2

## 2020-08-27 MED ORDER — LISINOPRIL 20 MG PO TABS
20.0000 mg | ORAL_TABLET | Freq: Every day | ORAL | Status: DC
  Administered 2020-08-27 – 2020-08-29 (×3): 20 mg via ORAL
  Filled 2020-08-27 (×3): qty 1

## 2020-08-27 MED ORDER — CARVEDILOL 25 MG PO TABS
25.0000 mg | ORAL_TABLET | Freq: Two times a day (BID) | ORAL | Status: DC
  Administered 2020-08-27 – 2020-08-29 (×5): 25 mg via ORAL
  Filled 2020-08-27 (×3): qty 1
  Filled 2020-08-27: qty 8
  Filled 2020-08-27: qty 1

## 2020-08-27 MED ORDER — OXYCODONE-ACETAMINOPHEN 5-325 MG PO TABS
1.0000 | ORAL_TABLET | Freq: Three times a day (TID) | ORAL | Status: DC | PRN
  Administered 2020-08-27: 1 via ORAL
  Filled 2020-08-27: qty 1

## 2020-08-27 MED ORDER — OXYCODONE HCL 5 MG PO TABS
5.0000 mg | ORAL_TABLET | Freq: Two times a day (BID) | ORAL | Status: DC | PRN
  Administered 2020-08-27: 5 mg via ORAL
  Filled 2020-08-27: qty 1

## 2020-08-27 MED ORDER — ACETAMINOPHEN 650 MG RE SUPP: 650.0000 mg | Freq: Four times a day (QID) | RECTAL | Status: DC | PRN

## 2020-08-27 MED ORDER — SUCRALFATE 1 G PO TABS
1.0000 g | ORAL_TABLET | Freq: Three times a day (TID) | ORAL | Status: DC
  Administered 2020-08-27 – 2020-08-29 (×10): 1 g via ORAL
  Filled 2020-08-27 (×10): qty 1

## 2020-08-27 MED ORDER — GUAIFENESIN ER 600 MG PO TB12
600.0000 mg | ORAL_TABLET | Freq: Two times a day (BID) | ORAL | Status: DC
  Administered 2020-08-27 – 2020-08-29 (×6): 600 mg via ORAL
  Filled 2020-08-27 (×6): qty 1

## 2020-08-27 MED ORDER — OXYCODONE HCL 5 MG PO TABS: 5.0000 mg | ORAL_TABLET | Freq: Three times a day (TID) | ORAL | Status: DC | PRN

## 2020-08-27 MED ORDER — ALBUTEROL SULFATE (2.5 MG/3ML) 0.083% IN NEBU
2.5000 mg | INHALATION_SOLUTION | RESPIRATORY_TRACT | Status: DC | PRN
  Administered 2020-08-27: 5 mg via RESPIRATORY_TRACT

## 2020-08-27 MED ORDER — BENZONATATE 100 MG PO CAPS
100.0000 mg | ORAL_CAPSULE | Freq: Three times a day (TID) | ORAL | Status: DC | PRN
  Administered 2020-08-28 – 2020-08-29 (×3): 100 mg via ORAL
  Filled 2020-08-27 (×3): qty 1

## 2020-08-27 MED ORDER — SERTRALINE HCL 100 MG PO TABS
100.0000 mg | ORAL_TABLET | Freq: Every day | ORAL | Status: DC
  Administered 2020-08-27 – 2020-08-28 (×3): 100 mg via ORAL
  Filled 2020-08-27 (×3): qty 1

## 2020-08-27 MED ORDER — MONTELUKAST SODIUM 10 MG PO TABS
10.0000 mg | ORAL_TABLET | Freq: Every day | ORAL | Status: DC
  Administered 2020-08-27 – 2020-08-28 (×3): 10 mg via ORAL
  Filled 2020-08-27 (×3): qty 1

## 2020-08-27 MED ORDER — DICYCLOMINE HCL 20 MG PO TABS
20.0000 mg | ORAL_TABLET | Freq: Three times a day (TID) | ORAL | Status: DC
  Administered 2020-08-27 – 2020-08-29 (×7): 20 mg via ORAL
  Filled 2020-08-27 (×9): qty 1

## 2020-08-27 MED ORDER — TOPIRAMATE 100 MG PO TABS
100.0000 mg | ORAL_TABLET | Freq: Two times a day (BID) | ORAL | Status: DC
  Administered 2020-08-29: 100 mg via ORAL
  Filled 2020-08-27 (×7): qty 1

## 2020-08-27 MED ORDER — BUPROPION HCL ER (SR) 150 MG PO TB12
150.0000 mg | ORAL_TABLET | Freq: Two times a day (BID) | ORAL | Status: DC
  Administered 2020-08-27 – 2020-08-29 (×5): 150 mg via ORAL
  Filled 2020-08-27 (×7): qty 1

## 2020-08-27 MED ORDER — GABAPENTIN 300 MG PO CAPS
600.0000 mg | ORAL_CAPSULE | Freq: Two times a day (BID) | ORAL | Status: DC
  Administered 2020-08-27 – 2020-08-29 (×6): 600 mg via ORAL
  Filled 2020-08-27 (×6): qty 2

## 2020-08-27 MED ORDER — MOMETASONE FURO-FORMOTEROL FUM 200-5 MCG/ACT IN AERO
2.0000 | INHALATION_SPRAY | Freq: Two times a day (BID) | RESPIRATORY_TRACT | Status: DC
  Administered 2020-08-27 – 2020-08-29 (×4): 2 via RESPIRATORY_TRACT
  Filled 2020-08-27: qty 8.8

## 2020-08-27 NOTE — ED Notes (Signed)
Report given to [place], RN 2W.

## 2020-08-27 NOTE — Progress Notes (Signed)
Family Medicine Teaching Service Daily Progress Note Intern Pager: [phone]  Patient name: [name] Medical record number: [id number] Date of birth: 16-Jun-1971 Age: 49 y.o. Gender: female  Primary Care Provider: [name], MD Consultants: None Code Status: FULL  Pt Overview and Major Events to Date:  Admitted 12/5  Assessment and Plan: [name] is a 49 y.o. female who presented with dyspnea likely 2/2 asthma exacerbation . PMH is significant for severe asthma, subglottic stenosis and tracheal stenosis s/p tracheostomy (current), diabetes, hypertension, chronic cough, OSA, h/o normocytic anemia, h/o gastritis and gastroduodenitis, generalized anxiety disorder, depression, morbid obesity, chronic pain syndrome, chronic bilateral low back pain, carpal tunnel syndrome, dysphonia.  Dyspnea, increased sputum production  Hx Asthma  Patient tachycardic overnight, heart rate ranging 109-127 since midnight.  Tachypneic but on RA with humidified air blowing through trach collar.  Lungs are clear to auscultation with some transmitted upper airway sounds.  Patient is intermittently coughing without any sputum production on my examination.  She is saturating 99% on room air.  WBC slightly increased at 11.3 this a.m from 7.2 yesterday. BNP 12.7 Discussed with respiratory therapist while in the room who believes patient would benefit from a longer trach tube, unfortunately we do not have this.  Patient additionally tells me that she missed her scheduled ENT appointment as she was "sick", her next scheduled appointment is late January.  - Continue home albuterol 2 puffs q6h PRN wheezing, albuterol nebulizer q6h as needed, Dulera 2 puffs daily, Sigulair 10 mg nightly  - Discuss with ENT, follows with [facility] - Cepacol lozenges PRN sore throat, cough - Follow up PT/OT  Type I Diabetes: chronic, stable Fasting glucose this morning 227.  Has received 3 units  of insulin aspart this morning.  Home medications include Tresiba 140 U daily, Ozempic 1 mg weekly.  - Continue 70 U Lantus nightly  - Moderate sliding scale insulin - CBG's before meals and nightly  Hypertension Blood pressure ranging 106-157/58-93 since midnight. Home medications include amlodipine 10 mg daily, Coreg 25 mg twice daily, lisinopril 20 mg daily, Lasix 20 mg daily. -Continue home amlodipine, Coreg, lisinopril  OSA Not on CPAP at home since 2018. -Monitor, consider CPAP if needed  History of Normocytic Anemia  Hx gastritis and gastroduodenitis AM CBC significant home medications include Bentyl 20 mg 3 times daily before meals, Protonix 40 mg twice daily, Carafate 1 g before meals and at bedtime. -Continue Bentyl, Protonix, Carafate  Generalized anxiety disorder, depression Medications include propranolol 150 mg twice daily, Zoloft 100 mg at bedtime, hydroxyzine 25 mg 3 times daily as needed and melatonin 3 mg at bedtime. -Continue home medications  Chronic pain syndrome, chronic bilateral low back pain, carpal tunnel syndrome Home medications include Percocet 10-325 mg 4 times daily as needed, but gabapentin 600 mg twice daily, tizanidine 4 mg every 8 hours as needed -Continue home medications at reduced twice daily dosing   Hyperlipidemia: Chronic, stable Home medications include atorvastatin 20 mg daily. -Continue atorvastatin daily  Migraines: Chronic, stable Home medications include sumatriptan 100 mg as needed migraine, topiramate 100 mg twice daily. -Continue topiramate daily   FEN/GI: NPO PPx: Lovenox   Status is: Observation  The patient remains OBS appropriate and will d/c before 2 midnights.  Dispo: The patient is from: Home              Anticipated d/c is to: Home  Anticipated d/c date is: 1 day              Patient currently is not medically stable to d/c.    Subjective:  Patient tells me that she continues to have pain around  her trach site and diffuse chest pain from her constant cough.  She has noted some sputum and blood production for the last 2 weeks and has had increasing pain in the last 1 week.  She unfortunately was unable to make her last ENT appointment due to feeling ill.  She has not scheduled to follow-up with her ENT physician until late January.  Tells me that she has a GI appointment in 2 days for follow-up on her gastritis.  Patient had similar symptoms about 1 year ago and was told it was due to an asthma flare.  States that the weather change typically triggers her asthma.  She feels slightly better than when she first came in but continues to have some shortness of breath and pain.  Objective: Temp:  [97.8 F (36.6 C)-98.6 F (37 C)] 97.8 F (36.6 C) (12/06 0338) Pulse Rate:  [118-141] 122 (12/06 0418) Resp:  [10-26] 24 (12/06 0418) BP: (106-170)/(58-113) 118/85 (12/06 0338) SpO2:  [92 %-100 %] 100 % (12/06 0418) FiO2 (%):  [21 %-28 %] 21 % (12/06 0418) Physical Exam: General: Obese, in some distress, anxious-appearing Cardiovascular: RRR, distant heart sounds Respiratory: Trach collar at 21%.  Trach midline, patent.  Tachypnic.  Lungs clear to auscultation anteriorly and posteriorly with some transmitted upper airway sounds.  No accessory muscle use.  Saturating 99% Abdomen: obese, mild epigastric tenderness on palpation, no rebound or guarding, hypoactive bowel sounds Extremities: Non-pitting edema b/l, 2+ DP and radial pulses  Laboratory: Recent Labs  Lab 08/26/20 1618 08/27/20 0029  WBC 7.2  --   HGB 11.8* 12.2  HCT 35.5* 36.0  PLT 404*  --    Recent Labs  Lab 08/26/20 1618 08/27/20 0029  NA 138 136  K 4.3 4.3  CL 104  --   CO2 23  --   BUN 9  --   CREATININE 1.07*  --   CALCIUM 9.0  --   GLUCOSE 203*  --     Imaging/Diagnostic Tests: None new.   [name], DO 08/27/2020, 5:56 AM PGY-1, [place] Intern pager: [phone], text pages  welcome

## 2020-08-27 NOTE — Progress Notes (Signed)
Spoke with patient's outpatient ENT at [facility].  He reports that patient is very complex and has stenosis throughout that causes limited treatment options.  He states that previously there have been discussions around seeking treatment/consultation from [place] or [place].    He recommends that ENT to see patient while admitted use scope to look for possible distal obstruction.  He also states that tracheitis should be considered, which usually does well with Bactrim.  He has an appointment with her in early January, which can be adjusted if needed pending hospitalization.  Appreciate his assistance and will f/u plan from ENT to see patient ehre.  Ari[place] Constable, D.O.  PGY-3 Family Medicine  08/27/2020 1:50 PM

## 2020-08-27 NOTE — Progress Notes (Addendum)
Interim Progress Note  [place] [facility].  It appears that patient sees Dr. Jerilee Hoh per prior records.  Spoke with assistant who states that Dr. Jerilee Hoh is not in the office today.  She will give him a message to call back.    12PM: Discussed with Dr. Lucia Gaskins.  States that he will come to see patient after clinic this evening. Appreciate consult and recommendations.

## 2020-08-27 NOTE — Hospital Course (Addendum)
[name] is a 49 y.o. female who presented with dyspnea and cough thought to be related to her trach. PMH is significant for severe asthma, subglottic stenosis and tracheal stenosis s/p tracheostomy (current), diabetes, hypertension, chronic cough, OSA, h/o normocytic anemia, h/o gastritis and gastroduodenitis, generalized anxiety disorder, depression, morbid obesity, chronic pain syndrome, chronic bilateral low back pain, carpal tunnel syndrome, dysphonia.  Tachypnea, increased sputum production likely 2/2 asthma exacerbation Patient presented with dyspnea, increased sputum production, and some blood-streaked sputum for the last 3 days prior to presentation.  She is found to be tachycardic and tachypneic in the emergency department.  Covid negative, flu A/B-, chest x-ray negative, CTA of her chest negative for PE, BNP within normal limits.  She received albuterol treatments with some improvement.  Her ENT physician, Dr. Jerilee Hoh, recommended scope to assess for distal obstruction.  Patient was scoped without any abnormal findings.  Source of her bleeding is most likely in the subglottic area where she has had chronic problems.  She maintained a clear airway throughout hospitalization.  With negative findings on labs, imaging and scope it was recommend that patient follow-up outpatient with her ENT at [facility].  Diabetes Mellitus Home medications notable for 140 units of Tresiba subcutaneous daily and Ozempic 1 mg once weekly. Patient was initially started on 70 units of Lantus.  Due to increased fasting sugars she was increased to 75 units daily with 4U TID with meals and 0-5U for nightly coverage.  Hypertension Home medications notable for amlodipine 10 mg daily, Coreg 25 mg twice daily, lisinopril 20 mg daily, Lasix 20 mg daily. Home Lasix was held while inpatient. Patient had no signs of fluid overload while hospitalized.  Other chronic medications stable.

## 2020-08-27 NOTE — Evaluation (Signed)
Occupational Therapy Evaluation and Discharge Patient Details Name: [name] MRN: [id number] DOB: Apr 30, 1971 Today's Date: 08/27/2020    History of Present Illness [name] is a 49 y.o. female presenting with dyspnea, found to be tachycardiac and tachypnic . PMH is significant for severe asthma, subglottic stenosis and tracheal stenosis s/p tracheostomy (current), diabetes, hypertension, chronic cough, OSA, h/o normocytic anemia, h/o gastritis and gastroduodenitis, generalized anxiety disorder, depression, morbid obesity, chronic pain syndrome, chronic bilateral low back pain, carpal tunnel syndrome, dysphonia.   Clinical Impression   This 49 yo female admitted with above presents to acute OT at an overall S level from a rollator level and has close to 24 hour S/prn A at home per her report. Pt on trach collar only for humidification, not for O2 needs.No further OT needs, we will sign off.    Follow Up Recommendations  No OT follow up;Supervision - Intermittent    Equipment Recommendations  None recommended by OT       Precautions / Restrictions Restrictions Weight Bearing Restrictions: No      Mobility Bed Mobility Overal bed mobility: Modified Independent             General bed mobility comments: HOB up    Transfers Overall transfer level: Needs assistance Equipment used: 4-wheeled walker Transfers: Sit to/from Stand Sit to Stand: Supervision         General transfer comment: A bit of a sway when up and about in her room without AD    Balance Overall balance assessment: Needs assistance Sitting-balance support: No upper extremity supported;Feet supported Sitting balance-[name]: Good     Standing balance support: No upper extremity supported Standing balance-[name]: Fair                             ADL either performed or assessed with clinical judgement   ADL Overall ADL's : Needs  assistance/impaired                                       General ADL Comments: overall at a S level; asked pt if she would like a shower seat to sit on so she would not get tired while standing to shower and her insurance would pay for it--she declined.     Vision Patient Visual Report: No change from baseline              Pertinent Vitals/Pain Pain Assessment: 0-10 Pain Score: 6  Pain Location: all over with coughing Pain Descriptors / Indicators: Grimacing;Guarding;Sore Pain Intervention(s): Limited activity within patient's tolerance;Monitored during session;Patient requesting pain meds-RN notified;RN gave pain meds during session     Hand Dominance Right   Extremity/Trunk Assessment Upper Extremity Assessment Upper Extremity Assessment: Overall WFL for tasks assessed           Communication Communication Communication: Tracheostomy   Cognition Arousal/Alertness: Awake/alert Behavior During Therapy: WFL for tasks assessed/performed Overall Cognitive Status: Within Functional Limits for tasks assessed                                                Home Living Family/patient expects to be discharged to:: Private residence Living Arrangements: Spouse/significant other;Children Available Help at Discharge: Family (  close to all the time) Type of Home: Apartment Home Access: Stairs to enter Entrance Stairs-Number of Steps: 12 Entrance Stairs-Rails: Right Home Layout: One level     Bathroom Shower/Tub: Corporate investment banker: Standard     Home Equipment: None          Prior Functioning/Environment          Comments: Family A with IADLs, pt is independent wtih BADLs        OT Problem List: Impaired balance (sitting and/or standing)         OT Goals(Current goals can be found in the care plan section) Acute Rehab OT Goals Patient Stated Goal: to feel better and not cough/hurt with coughing   OT Frequency:             Co-evaluation PT/OT/SLP Co-Evaluation/Treatment: Yes Reason for Co-Treatment: To address functional/ADL transfers PT goals addressed during session: Mobility/safety with mobility;Balance;Proper use of DME;Strengthening/ROM OT goals addressed during session: Strengthening/ROM;ADL's and self-care      AM-PAC OT "6 Clicks" Daily Activity     Outcome Measure Help from another person eating meals?: None Help from another person taking care of personal grooming?: A Little (S) Help from another person toileting, which includes using toliet, bedpan, or urinal?: A Little (S) Help from another person bathing (including washing, rinsing, drying)?: A Little (S) Help from another person to put on and taking off regular upper body clothing?: A Little (S) Help from another person to put on and taking off regular lower body clothing?: A Little (S) 6 Click Score: 19   End of Session Equipment Utilized During Treatment: Surveyor, mining Communication: Patient requests pain meds  Activity Tolerance: Patient tolerated treatment well Patient left: in bed;with call bell/phone within reach  OT Visit Diagnosis: Unsteadiness on feet (R26.81);Pain;Muscle weakness (generalized) (M62.81) Pain - Right/Left:  (generalized)                Time: 0938-1829 OT Time Calculation (min): 37 min Charges:  OT General Charges $OT Visit: 1 Visit OT Evaluation $OT Eval Moderate Complexity: [place], OTR/L Acute [employer] Pager [phone] Office [phone]     Almon Register 08/27/2020, 1:17 PM

## 2020-08-27 NOTE — Progress Notes (Signed)
Dr. Lucia Gaskins called and will be seeing patient tomorrow.  Advised that this would be acceptable as no urgent need tonight.    He reports that if scope shows distal obstruction, patient will likely need transfer.  Arizona Constable, D.O.  PGY-3 Family Medicine  08/27/2020 7:11 PM

## 2020-08-27 NOTE — Progress Notes (Signed)
08/27/20 1345  PT Visit Information  Last PT Received On 08/27/20  Assistance Needed +1  PT/OT/SLP Co-Evaluation/Treatment Yes  Reason for Co-Treatment To address functional/ADL transfers  PT goals addressed during session Mobility/safety with mobility;Balance;Proper use of DME  History of Present Illness Kathia Mae [name] is a 49 y.o. female presenting with dyspnea, found to be tachycardiac and tachypnic . PMH is significant for severe asthma, subglottic stenosis and tracheal stenosis s/p tracheostomy (current), diabetes, hypertension, chronic cough, OSA, h/o normocytic anemia, h/o gastritis and gastroduodenitis, generalized anxiety disorder, depression, morbid obesity, chronic pain syndrome, chronic bilateral low back pain, carpal tunnel syndrome, dysphonia.  Precautions  Precautions Fall  Precaution Comments trach at baseline  Restrictions  Weight Bearing Restrictions No  Home Living  Family/patient expects to be discharged to: Private residence  Living Arrangements Spouse/significant other;Children  Available Help at Discharge Family  Type of [place] to enter  Entrance Stairs-Number of Steps 12  Entrance Stairs-Rails Right  Home Layout One level  Bathroom Shower/Tub Tub/shower unit;Curtain  Automotive engineer None  Prior Function  Level of Independence Independent  Communication  Communication Tracheostomy  Pain Assessment  Pain Assessment 0-10  Pain Score 6  Pain Location all over with coughing  Pain Descriptors / Indicators Grimacing;Guarding;Sore  Pain Intervention(s) Limited activity within patient's tolerance;Monitored during session;Repositioned  Cognition  Arousal/Alertness Awake/alert  Behavior During Therapy WFL for tasks assessed/performed  Overall Cognitive Status Within Functional Limits for tasks assessed  Upper Extremity Assessment  Upper Extremity Assessment Defer to OT evaluation  Lower  Extremity Assessment  Lower Extremity Assessment Generalized weakness  Cervical / Trunk Assessment  Cervical / Trunk Assessment Normal  Bed Mobility  Overal bed mobility Modified Independent  Transfers  Overall transfer level Needs assistance  Equipment used 4-wheeled walker;None  Transfers Sit to/from Stand  Sit to Stand Supervision  General transfer comment Supervision for safety to stand with rollator. Increased sway noted when not using AD   Ambulation/Gait  Ambulation/Gait assistance Min guard  Gait Distance (Feet) 75 Feet  Assistive device 4-wheeled walker  Gait Pattern/deviations Step-through pattern;Decreased stride length  General Gait Details Min guard for safety. Pt with increased SOB, which limited ambulation tolerance. Practiced sitting on rollator in hall.   Gait velocity Decreased  Balance  Overall balance assessment Needs assistance  Sitting-balance support No upper extremity supported;Feet supported  Sitting balance-[name] Good  Standing balance support Bilateral upper extremity supported;No upper extremity supported  Standing balance-[name] Fair  Standing balance comment Able to maintain static standing without UE support   PT - End of Session  Activity Tolerance Patient limited by fatigue  Patient left in bed;with call bell/phone within reach;with nursing/sitter in room  Nurse Communication Mobility status  PT Assessment  PT Recommendation/Assessment Patient needs continued PT services  PT Visit Diagnosis Other abnormalities of gait and mobility (R26.89)  PT Problem List Decreased activity tolerance;Decreased balance;Decreased mobility;Decreased knowledge of use of DME;Decreased knowledge of precautions  PT Plan  PT Frequency (ACUTE ONLY) Min 3X/week  PT Treatment/Interventions (ACUTE ONLY) Gait training;DME instruction;Functional mobility training;Stair training;Therapeutic activities;Therapeutic exercise;Balance training;Patient/family education   AM-PAC PT "6 Clicks" Mobility Outcome Measure (Version 2)  Help needed turning from your back to your side while in a flat bed without using bedrails? 4  Help needed moving from lying on your back to sitting on the side of a flat bed without using bedrails? 4  Help needed moving to and from a bed  to a chair (including a wheelchair)? 3  Help needed standing up from a chair using your arms (e.g., wheelchair or bedside chair)? 3  Help needed to walk in hospital room? 3  Help needed climbing 3-5 steps with a railing?  2  6 Click Score 19  Consider Recommendation of Discharge To: Home with [facility]  PT Recommendation  Follow Up Recommendations Home health PT (vs No PT follow up pending progression )  PT equipment Other (comment) (4 wheeled walker with seat-bariatric (rollator))  Individuals Consulted  Consulted and Agree with Results and Recommendations Patient  Acute Rehab PT Goals  Patient Stated Goal to feel better and not cough/hurt with coughing  PT Goal Formulation With patient  Time For Goal Achievement 09/10/20  Potential to Achieve Goals Good  PT Time Calculation  PT Start Time (ACUTE ONLY) 1220  PT Stop Time (ACUTE ONLY) 1252  PT Time Calculation (min) (ACUTE ONLY) 32 min  PT General Charges  $$ ACUTE PT VISIT 1 Visit  PT Evaluation  $PT Eval Moderate Complexity 1 Mod  Written Expression  Dominant Hand Right   Pt admitted secondary to problem above with deficits below. Pt requiring min guard A for mobility tasks using rollator. Pt with increased SOB which limited ambulation tolerance. Noted increased sway when ambulating without AD. Recommend rollator at d/c. Currently recommending HHPT given balance deficits, however, may progress to not needing PT follow up at d/c. Will continue to follow acutely to maximize functional mobility independence and safety.   Reuel Derby, PT, DPT  Acute Rehabilitation Services  Pager: [phone] Office: [phone]

## 2020-08-28 DIAGNOSIS — Z794 Long term (current) use of insulin: Secondary | ICD-10-CM

## 2020-08-28 DIAGNOSIS — I10 Essential (primary) hypertension: Secondary | ICD-10-CM

## 2020-08-28 DIAGNOSIS — N1831 Chronic kidney disease, stage 3a: Secondary | ICD-10-CM

## 2020-08-28 DIAGNOSIS — J9620 Acute and chronic respiratory failure, unspecified whether with hypoxia or hypercapnia: Secondary | ICD-10-CM

## 2020-08-28 DIAGNOSIS — E1322 Other specified diabetes mellitus with diabetic chronic kidney disease: Secondary | ICD-10-CM

## 2020-08-28 DIAGNOSIS — Z930 Tracheostomy status: Secondary | ICD-10-CM | POA: Diagnosis not present

## 2020-08-28 DIAGNOSIS — G4733 Obstructive sleep apnea (adult) (pediatric): Secondary | ICD-10-CM | POA: Diagnosis not present

## 2020-08-28 LAB — CBC
HCT: 31.9 % — ABNORMAL LOW (ref 36.0–46.0)
Hemoglobin: 10.7 g/dL — ABNORMAL LOW (ref 12.0–15.0)
MCH: 28.8 pg (ref 26.0–34.0)
MCHC: 33.5 g/dL (ref 30.0–36.0)
MCV: 86 fL (ref 80.0–100.0)
Platelets: 396 10*3/uL (ref 150–400)
RBC: 3.71 MIL/uL — ABNORMAL LOW (ref 3.87–5.11)
RDW: 14.3 % (ref 11.5–15.5)
WBC: 11.3 10*3/uL — ABNORMAL HIGH (ref 4.0–10.5)
nRBC: 0 % (ref 0.0–0.2)

## 2020-08-28 LAB — GLUCOSE, CAPILLARY
Glucose-Capillary: 255 mg/dL — ABNORMAL HIGH (ref 70–99)
Glucose-Capillary: 240 mg/dL — ABNORMAL HIGH (ref 70–99)
Glucose-Capillary: 139 mg/dL — ABNORMAL HIGH (ref 70–99)
Glucose-Capillary: 163 mg/dL — ABNORMAL HIGH (ref 70–99)
Glucose-Capillary: 265 mg/dL — ABNORMAL HIGH (ref 70–99)

## 2020-08-28 LAB — BASIC METABOLIC PANEL
Anion gap: 11 (ref 5–15)
BUN: 23 mg/dL — ABNORMAL HIGH (ref 6–20)
CO2: 22 mmol/L (ref 22–32)
Calcium: 8.9 mg/dL (ref 8.9–10.3)
Chloride: 103 mmol/L (ref 98–111)
Creatinine, Ser: 1.29 mg/dL — ABNORMAL HIGH (ref 0.44–1.00)
GFR, Estimated: 51 mL/min — ABNORMAL LOW (ref >60)
Glucose, Bld: 279 mg/dL — ABNORMAL HIGH (ref 70–99)
Potassium: 4.2 mmol/L (ref 3.5–5.1)
Sodium: 136 mmol/L (ref 135–145)

## 2020-08-28 MED ORDER — MELATONIN 3 MG PO TABS
3.0000 mg | ORAL_TABLET | Freq: Every day | ORAL | Status: DC
  Administered 2020-08-28: 3 mg via ORAL
  Filled 2020-08-28: qty 1

## 2020-08-28 MED ORDER — INSULIN ASPART 100 UNIT/ML ~~LOC~~ SOLN
0.0000 [IU] | Freq: Every day | SUBCUTANEOUS | Status: DC
  Administered 2020-08-28: 3 [IU] via SUBCUTANEOUS

## 2020-08-28 MED ORDER — INSULIN ASPART 100 UNIT/ML ~~LOC~~ SOLN
0.0000 [IU] | Freq: Three times a day (TID) | SUBCUTANEOUS | Status: DC
  Administered 2020-08-28: 1 [IU] via SUBCUTANEOUS
  Administered 2020-08-28: 5 [IU] via SUBCUTANEOUS
  Administered 2020-08-29: 1 [IU] via SUBCUTANEOUS
  Administered 2020-08-29: 2 [IU] via SUBCUTANEOUS

## 2020-08-28 MED ORDER — INSULIN GLARGINE 100 UNIT/ML ~~LOC~~ SOLN
75.0000 [IU] | Freq: Every day | SUBCUTANEOUS | Status: DC
  Administered 2020-08-29: 75 [IU] via SUBCUTANEOUS
  Filled 2020-08-28: qty 0.75

## 2020-08-28 MED ORDER — INSULIN ASPART 100 UNIT/ML ~~LOC~~ SOLN
4.0000 [IU] | Freq: Three times a day (TID) | SUBCUTANEOUS | Status: DC
  Administered 2020-08-28 – 2020-08-29 (×4): 4 [IU] via SUBCUTANEOUS

## 2020-08-28 NOTE — Progress Notes (Signed)
Pt did not come with obturator for trach. Pt does, however, have spare trach at bedside with obturator in box in case needed.

## 2020-08-28 NOTE — Discharge Summary (Addendum)
I have reviewed the below documentation. I have seen the patient on the day of discharge and agree with the below exam.  In addition to below, I recommend the following at follow up:  Findings below--recommendations as indicated. Will need pelvic ultrasound (previously identified ovarian cystic lesion.   Dorris Singh, MD  [facility] Discharge Summary  Patient name: [name] Medical record number: [id number] Date of birth: 10/29/1970 Age: 49 y.o. Gender: female Date of Admission: 08/26/2020  Date of Discharge: 08/29/2020 Admitting Physician: Ezequiel Essex, MD  Primary Care Provider: [name], MD Consultants: ENT  Indication for Hospitalization: Dyspnea due to complications of tracheostomy   Discharge Diagnoses/Problem List:  Complications of Tracheostomy  Diabetes on insulin therapy  Morbid obesity Severe persistent asthma   Disposition: Home  Discharge Condition: Stable  Discharge Exam:  Blood pressure 100/69, pulse 90, temperature 98 F (36.7 C), temperature source Oral, resp. rate 18, height _0  (1.549 m), weight 106.9 kg, SpO2 99 %. Gen: Obese, intermittently coughing, conversational Resp: Intermittently coughing, lungs clear to auscultation bilaterally without wheezing, rhonchi, or rales.  Trach midline, patent and without secretions Cardio: RRR, distant heart sounds secondary to body habitus  Brief Hospital Course:  [name] is a 49 y.o. female who presented with dyspnea and cough thought to be related to her trach. 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  Tachypnea, increased sputum production likely 2/2 tracheostomy dysfunction,  Patient presented with dyspnea, increased sputum production, and some blood-streaked sputum for the last 3 days prior to presentation.  She is found to be tachycardic and tachypneic in the emergency department.  Covid negative, flu A/B-, chest x-ray negative, CTA of her chest negative for PE, BNP within normal limits. Troponin negative. Her ENT physician, Dr. Jerilee Hoh, recommended scope to assess for distal obstruction.  Patient was scoped without any abnormal findings.  Source of her bleeding is most likely in the subglottic area where she has had chronic problems.  She maintained a clear airway throughout hospitalization. NO findings suggestive of asthma.   With negative findings on labs, imaging and scope it was recommend that patient follow-up outpatient with her ENT at [facility].  Diabetes Mellitus(documented in chart as both type 1 and type 2)  Home medications notable for 140 units of Tresiba subcutaneous daily and Ozempic 1 mg once weekly. Patient was initially started on 70 units of Lantus.  Due to increased fasting sugars she was increased to 75 units daily with 4U TID with meals and 0-5U for nightly coverage.  Hypertension Home medications notable for amlodipine 10 mg daily, Coreg 25 mg twice daily, lisinopril 20 mg daily, Lasix 20 mg daily. Home Lasix was held while inpatient. Patient had no signs of fluid overload while hospitalized.  Other chronic medications stable.     Issues for Follow Up:  1. Follow up left ovarian mass found incidentally on CT abdomen 08/10/20-pelvic. Consider [place] or MRI pelvis. Recommend close follow up  2. Will need GI follow up for her gastritis and gastroduodenitis 3. Consider repeat CBC due to hx anemia 4. Lasix held while inpatient, home medications restarted on d/c. Repeat BMP at follow up  5. Received first dose of Pfizer COVID vaccine and Flu vaccine  on 08/29/20,  prior to d/c. Will need second [place] vaccine 1/05 6. Normocytic anemia-- recommend repeat at follow up, likely chronic disease, consider colonoscopy  7. Recommend close ENT follow up   Significant Procedures: Fiberoptic bronchoscopy with fiberoptic laryngoscope through tracheostomy. No distal obstruction visualized.   Significant Labs and Imaging:  Recent Labs  Lab 08/27/20 0735 08/28/20 0648 08/29/20 0405  WBC 11.3* 11.3* 11.6*  HGB 11.2* 10.7* 10.9*  HCT 34.8* 31.9* 34.5*  PLT 393 396 365   Recent Labs  Lab 08/26/20 1618 08/26/20 1618 08/27/20 0029 08/27/20 0029 08/27/20 0735 08/27/20 0735 08/28/20 0648 08/29/20 0405  NA 138  --  136  --  134*  --  136 138  K 4.3   < > 4.3   < > 4.5   < > 4.2 4.2  CL 104  --   --   --  101  --  103 104  CO2 23  --   --   --  20*  --  22 25  GLUCOSE 203*  --   --   --  279*  --  279* 156*  BUN 9  --   --   --  18  --  23* 30*  CREATININE 1.07*  --   --   --  1.29*  --  1.29* 1.26*  CALCIUM 9.0  --   --   --  9.7  --  8.9 8.9  ALKPHOS  --   --   --   --  83  --   --   --   AST  --   --   --   --  18  --   --   --   ALT  --   --   --   --  25  --   --   --   ALBUMIN  --   --   --   --  3.4*  --   --   --    < > = values in this interval not displayed.   DG Chest Port 1 View 12/5 CLINICAL DATA:  Chest pain and shortness of breath. Bloody sputum coming from tracheostomy EXAM: PORTABLE CHEST 1 VIEW COMPARISON:  March 25, 2019 FINDINGS: The tracheostomy tube is stable. The cardiomediastinal silhouette is unchanged. No pneumothorax. No nodules or masses. No focal infiltrates. IMPRESSION: No active disease.   CT ANGIOGRAPHY CHEST WITH CONTRAST 12/5 TECHNIQUE: Multidetector CT imaging of the chest was performed using the standard protocol during bolus administration of intravenous contrast. Multiplanar CT image reconstructions and MIPs were obtained to evaluate the vascular anatomy. CONTRAST:  65m OMNIPAQUE IOHEXOL 350  MG/ML SOLN COMPARISON:  03/22/2019.  Current chest radiograph. FINDINGS: Cardiovascular: Pulmonary arteries are well opacified. There is no evidence of a pulmonary embolism. Heart is normal in size and configuration. No pericardial effusion. No coronary artery calcifications. Normal great vessels. Mediastinum/Nodes: Well-positioned tracheostomy tube stable from the prior exam. No neck base, axillary, mediastinal or hilar masses or enlarged lymph nodes. Trachea and esophagus are unremarkable. Lungs/Pleura: Lungs are clear. No pleural effusion or pneumothorax. Upper Abdomen: Fatty infiltration of the liver.  No acute findings. Musculoskeletal: No fracture or acute finding. No bone lesion. No chest wall masses. Review of the MIP images confirms the above findings. IMPRESSION: 1. No evidence of a pulmonary embolism. 2. No acute findings.  Clear lungs  Results/Tests Pending at Time of Discharge: None  Discharge Medications:  Allergies as of 08/29/2020  Reactions   Reglan [metoclopramide] Other (See Comments)   Panic attack      Medication List    STOP taking these medications   ondansetron 4 MG tablet Commonly known as: Zofran   promethazine 25 MG tablet Commonly known as: PHENERGAN   tiZANidine 4 MG tablet Commonly known as: ZANAFLEX     TAKE these medications   acetaminophen 325 MG tablet Commonly known as: TYLENOL Take 1-2 tablets (325-650 mg total) by mouth every 6 (six) hours as needed for mild pain (pain score 1-3 or temp > 100.5).   albuterol (2.5 MG/3ML) 0.083% nebulizer solution Commonly known as: PROVENTIL Take 3 mLs (2.5 mg total) by nebulization every 6 (six) hours as needed for wheezing or shortness of breath.   albuterol 108 (90 Base) MCG/ACT inhaler Commonly known as: ProAir HFA Inhale 2 puffs into the lungs every 6 (six) hours as needed for wheezing or shortness of breath.   amLODipine 10 MG tablet Commonly known as: NORVASC Take 1 tablet (10 mg  total) by mouth daily.   atorvastatin 20 MG tablet Commonly known as: LIPITOR Take 1 tablet (20 mg total) by mouth daily.   buPROPion 150 MG 12 hr tablet Commonly known as: Wellbutrin SR Take 1 tablet (150 mg total) by mouth 2 (two) times daily.   carvedilol 25 MG tablet Commonly known as: COREG Take 1 tablet (25 mg total) by mouth 2 (two) times daily with a meal.   clotrimazole-betamethasone cream Commonly known as: LOTRISONE Apply 1 application topically 3 (three) times daily as needed. For rash   dicyclomine 20 MG tablet Commonly known as: BENTYL Take 1 tablet (20 mg total) by mouth 3 (three) times daily before meals.   Dulera 200-5 MCG/ACT Aero Generic drug: mometasone-formoterol Inhale 2 puffs into the lungs in the morning and at bedtime.   Fifty50 Pen Needles 31G X 8 MM Misc Generic drug: Insulin Pen Needle Inject 1 Units as directed daily.   furosemide 20 MG tablet Commonly known as: LASIX Take 1 tablet (20 mg total) by mouth daily.   gabapentin 300 MG capsule Commonly known as: NEURONTIN Take 2 capsules (600 mg total) by mouth 2 (two) times daily.   hydrOXYzine 25 MG tablet Commonly known as: ATARAX/VISTARIL Take 1 tablet (25 mg total) by mouth 3 (three) times daily as needed for anxiety.   lisinopril 20 MG tablet Commonly known as: ZESTRIL Take 1 tablet (20 mg total) by mouth daily.   melatonin 3 MG Tabs tablet Take 3 mg by mouth at bedtime as needed (for sleep).   Misc. Devices Misc Nebulizer device. Diagnosis - Asthma   montelukast 10 MG tablet Commonly known as: SINGULAIR Take 1 tablet (10 mg total) by mouth at bedtime.   OneTouch Delica Lancets 81X Misc 1 each by Does not apply route daily. Use to monitor glucose levels daily; E11.9   OneTouch Verio test strip Generic drug: glucose blood Use to monitor glucose levels daily; E11.9   OneTouch Verio w/Device Kit 1 each by Does not apply route daily. Use to monitor glucose levels daily; E11.9    oxyCODONE-acetaminophen 10-325 MG tablet Commonly known as: PERCOCET Take 1 tablet by mouth 4 (four) times daily as needed for pain.   Ozempic (1 MG/DOSE) 2 MG/1.5ML Sopn Generic drug: Semaglutide (1 MG/DOSE) Inject 1 mg into the skin once a week.   pantoprazole 40 MG tablet Commonly known as: PROTONIX Take 1 tablet (40 mg total) by mouth 2 (two) times daily.   sertraline  100 MG tablet Commonly known as: ZOLOFT Take 1 tablet (100 mg total) by mouth at bedtime.   sucralfate 1 g tablet Commonly known as: Carafate Take 1 tablet (1 g total) by mouth 4 (four) times daily -  with meals and at bedtime.   SUMAtriptan 100 MG tablet Commonly known as: IMITREX At the onset of a migraine; may repeat in 2 hours. Max daily dose 25m What changed:   how much to take  how to take this  when to take this  reasons to take this  additional instructions   topiramate 100 MG tablet Commonly known as: Topamax Take 1 tablet (100 mg total) by mouth 2 (two) times daily.   TTyler AasFlexTouch 200 UNIT/ML FlexTouch Pen Generic drug: insulin degludec Inject 140 Units into the skin daily.       Discharge Instructions: Please refer to Patient Instructions section of EMR for full details.  Patient was counseled important signs and symptoms that should prompt return to medical care, changes in medications, dietary instructions, activity restrictions, and follow up appointments.   Follow-Up Appointments:  Follow-up Information    NCharlott Rakes MD. Schedule an appointment as soon as possible for a visit.   Specialty: Family Medicine Why: Please call to schedule an appointment within the next week. Contact information: [address] [phone]               ESharion Settler DO 08/29/2020, 9:48 AM PGY-1, [facility]

## 2020-08-28 NOTE — Progress Notes (Signed)
Family Medicine Teaching Service Daily Progress Note Intern Pager: [phone]  Patient name: [name] Medical record number: [id number] Date of birth: Sep 02, 1971 Age: 49 y.o. Gender: female  Primary Care Provider: [name], MD Consultants: ENT Code Status: FULL  Pt Overview and Major Events to Date:  Admitted 12/5  Assessment and Plan: Brittney Mae Hutchinson-Matthewsis a 49 y.o.femalewho presented with dyspnea and cough thought to be due to distal obstruction vs tracheitis. PMH is significant for severe asthma, subglottic stenosis and tracheal stenosis s/p tracheostomy (current),diabetes, hypertension, chronic cough, OSA,h/onormocytic anemia, h/ogastritis and gastroduodenitis, generalized anxiety disorder, depression, morbid obesity, chronic pain syndrome, chronic bilateral low back pain,carpal tunnel syndrome,dysphonia.  Acute Dyspnea with Trach, ?distal obstruction vs. tracheitis Patient continues to have cough and some chest pain which worsens when she coughs. ENT performed distal scope today that did not show distal obstruction.  - Arrange follow up with [facility]   Diabetes Mellitus: chronic, stable Fasting glucose 255. Home medications include Tresiba 140 U daily, Ozempic 1 mg weekly.  - Increase to 75U Lantus daily from 70U - Novolog 4U TID with meals if pt eats 50% or more of meals - Novolog 0-5U QHS  - CBG's AC and QHS  Hypertension: chronic, stable BP's over last 24 hours range 104-138/73-92. Home medications include amlodipine 10 mg daily, Coreg 25 mg twice daily, lisinopril 20 mg daily, Lasix 20 mg daily. - Continue home medications - Vitals per floor protocol  History of Normocytic Anemia  Hx gastritis and gastroduodenitis Home medications include Bentyl 20 mg 3 times daily before meals, Protonix 40 mg twice daily, Carafate 1 g before meals and at bedtime. -Continue Bentyl, Protonix, Carafate -Scheduled for follow up GI visit 12/8,  will likely need to be rescheduled  Generalized anxiety disorder, depression Medications include propranolol 150 mg twice daily, Zoloft 100 mg at bedtime, hydroxyzine 25 mg 3 times daily as needed and melatonin 3 mg at bedtime. -Buproprion 150 mg daily  -Hydroxyzine 25 mg TID PRN anxiety -Sertraline 100 mg daily at bedtime -Continue melatonin 3mg  at bedtime  Chronic pain syndrome, chronic bilateral low back pain, carpal tunnel syndrome Home medications include Percocet 10-325 mg 4 times daily as needed, gabapentin 600 mg twice daily, tizanidine 4 mg every 8 hours as needed -Continue Oxy IR 10 mg q6h PRN, severe pain -Continue Gabapentin 600 mg BID  Hyperlipidemia: Chronic, stable Home medications include atorvastatin 20 mg daily. -Continue atorvastatin daily  Migraines: Chronic, stable Home medications include sumatriptan 100 mg as needed migraine, topiramate 100 mg twice daily. -Continue topiramate daily  OSA: chronic, stable Not on CPAP.  FEN/GI: carb modified PPx: Lovenox   Status is: Inpatient  Remains inpatient appropriate because:Unsafe d/c plan   Dispo:  Patient From: Home  Planned Disposition: To be determined  Expected discharge date: 08/29/20  Medically stable for discharge: No    Subjective:  Patient continues to complain of cough.  States that she continues to cough up sputum and some "blood clots".  She continues to have some chest pain that is associated with her cough.  States that she did not know she had cough drops ordered and only received her first from this morning.  She is unsure if it is helping since she just received this.  She is wondering how much longer she will need to be in the hospital in order to update her husband.  States that she will reschedule her GI appointment that is scheduled for tomorrow.  Objective: Temp:  [98.6  F (37 C)-98.9 F (37.2 C)] 98.9 F (37.2 C) (12/07 0419) Pulse Rate:  [90-105] 90 (12/07 0419) Resp:   [17-22] 20 (12/07 0419) BP: (104-138)/(73-92) 108/82 (12/07 0419) SpO2:  [96 %-100 %] 100 % (12/07 0427) FiO2 (%):  [21 %-28 %] 28 % (12/07 0427) Weight:  [106.9 kg] 106.9 kg (12/06 1026) Physical Exam: General: Obese, trach in place Cardiovascular: RRR, no murmur Respiratory: CTAB, good air movement without wheezing, rhonchi or rales. Trach patent and with no secretions Abdomen: obese, normoactive bowel sounds Extremities: No edema, 2+ DP, radial pulses b/l  Laboratory: Recent Labs  Lab 08/26/20 1618 08/27/20 0029 08/27/20 0735  WBC 7.2  --  11.3*  HGB 11.8* 12.2 11.2*  HCT 35.5* 36.0 34.8*  PLT 404*  --  393   Recent Labs  Lab 08/26/20 1618 08/27/20 0029 08/27/20 0735  NA 138 136 134*  K 4.3 4.3 4.5  CL 104  --  101  CO2 23  --  20*  BUN 9  --  18  CREATININE 1.07*  --  1.29*  CALCIUM 9.0  --  9.7  PROT  --   --  7.6  BILITOT  --   --  0.4  ALKPHOS  --   --  83  ALT  --   --  25  AST  --   --  18  GLUCOSE 203*  --  279*    Imaging/Diagnostic Tests: None new  [name], DO 08/28/2020, 6:40 AM PGY-1, [place] Intern pager: [phone], text pages welcome

## 2020-08-28 NOTE — Consult Note (Signed)
Reason for Consult: Patient with a tracheostomy with coughing and some bleeding. Referring Physician: [facility]  [name] is an 49 y.o. female.  HPI: Patient with a long history of severe asthma.  Apparently 3 years ago she had to be intubated and following her intubation she developed subglottic stenosis.  This has been treated at [facility] numerous times and she is followed by Dr. Jerilee Hoh at [facility].  She was recently admitted here because of chronic coughing and some bleeding coming from the tracheostomy.  She presently has a #4 Shiley tracheostomy in place.  She is able to talk around the trach.  She is having no bleeding presently although she is having some coughing.  Past Medical History:  Diagnosis Date  . Allergy   . Anxiety   . Arthritis   . Asthma   . Chronic back pain   . Chronic chest pain    resolved, no problems since 2019 per patient 07/27/19  . COPD (chronic obstructive pulmonary disease) ([place])   . Depression   . DM (diabetes mellitus) ([place])    INSULIN DEPENDENT - TYPE 1  . GERD (gastroesophageal reflux disease)   . Headache(784.0)   . Hyperlipidemia    no med, diet controlled  . Hypertension   . Hypokalemia   . Respiratory disease 05/2017   uses inhalers, neb tx prn, no oxygen  . Sleep apnea    does not use CPAP due to trach  . Tracheostomy in place [facility]) 02/2017    Past Surgical History:  Procedure Laterality Date  . ABDOMINAL HYSTERECTOMY  2009  . APPENDECTOMY    . CESAREAN SECTION     x 3  . CHOLECYSTECTOMY N/A 03/02/2014   Procedure: LAPAROSCOPIC CHOLECYSTECTOMY;  Surgeon: Joyice Faster. Cornett, MD;  Location: [place];  Service: General;  Laterality: N/A;  . HERNIA REPAIR    . PANENDOSCOPY N/A 03/04/2017   Procedure: PANENDOSCOPY WITH POSSIBLE FOREIGN BODY REMOVAL;  Surgeon: Jodi Marble, MD;  Location: [place];  Service: ENT;  Laterality: N/A;  . ROTATOR CUFF REPAIR Left   . ROTATOR CUFF REPAIR Right 07/27/2019  .  SHOULDER ARTHROSCOPY WITH SUBACROMIAL DECOMPRESSION, ROTATOR CUFF REPAIR AND BICEP TENDON REPAIR Right 07/28/2019   Procedure: RIGHT SHOULDER ARTHROSCOP, MINI OPEN ROTATOR CUFF TEAR REPAIR,  BICEPS TENODESIS, DISTAL CLAVICLE EXCISION;  Surgeon: Meredith Pel, MD;  Location: [place];  Service: Orthopedics;  Laterality: Right;  . TRACHEOSTOMY  02/2017  . VESICOVAGINAL FISTULA CLOSURE W/ TAH  2009    Social History:  reports that she quit smoking about 3 years ago. Her smoking use included cigarettes. She has a 5.50 pack-year smoking history. She has never used smokeless tobacco. She reports current alcohol use. She reports current drug use. Drug: Marijuana.  Allergies:  Allergies  Allergen Reactions  . Reglan [Metoclopramide] Other (See Comments)    Panic attack    Medications: I have reviewed the patient's current medications.  Results for orders placed or performed during the hospital encounter of 08/26/20 (from the past 48 hour(s))  Basic metabolic panel     Status: Abnormal   Collection Time: 08/26/20  4:18 PM  Result Value Ref Range   Sodium 138 135 - 145 mmol/L   Potassium 4.3 3.5 - 5.1 mmol/L   Chloride 104 98 - 111 mmol/L   CO2 23 22 - 32 mmol/L   Glucose, Bld 203 (H) 70 - 99 mg/dL    Comment: Glucose reference range applies only to samples taken  after fasting for at least 8 hours.   BUN 9 6 - 20 mg/dL   Creatinine, Ser 1.07 (H) 0.44 - 1.00 mg/dL   Calcium 9.0 8.9 - 10.3 mg/dL   GFR, Estimated >60 >60 mL/min    Comment: (NOTE) Calculated using the CKD-EPI Creatinine Equation (2021)    Anion gap 11 5 - 15    Comment: Performed at [facility] [address], [place], [place] [address]  CBC     Status: Abnormal   Collection Time: 08/26/20  4:18 PM  Result Value Ref Range   WBC 7.2 4.0 - 10.5 K/uL   RBC 4.09 3.87 - 5.11 MIL/uL   Hemoglobin 11.8 (L) 12.0 - 15.0 g/dL   HCT 35.5 (L) 36 - 46 %   MCV 86.8 80.0 - 100.0 fL   MCH 28.9 26.0 - 34.0 pg   MCHC 33.2 30.0  - 36.0 g/dL   RDW 13.7 11.5 - 15.5 %   Platelets 404 (H) 150 - 400 K/uL   nRBC 0.0 0.0 - 0.2 %    Comment: Performed at [facility], [place] [address], [place], [place] [address]  Troponin I (High Sensitivity)     Status: None   Collection Time: 08/26/20  4:18 PM  Result Value Ref Range   Troponin I (High Sensitivity) 3 <18 ng/L    Comment: (NOTE) Elevated high sensitivity troponin I (hsTnI) values and significant  changes across serial measurements may suggest ACS but many other  chronic and acute conditions are known to elevate hsTnI results.  Refer to the "Links" section for chest pain algorithms and additional  guidance. Performed at [facility], [place] [address] [place], [place] [address]   I-Stat beta hCG blood, ED     Status: None   Collection Time: 08/26/20  4:27 PM  Result Value Ref Range   I-stat hCG, quantitative <5.0 <5 mIU/mL   Comment 3            Comment:   GEST. AGE      CONC.  (mIU/mL)   <=1 WEEK        5 - 50     2 WEEKS       50 - 500     3 WEEKS       100 - 10,000     4 WEEKS     1,000 - 30,000        FEMALE AND NON-PREGNANT FEMALE:     LESS THAN 5 mIU/mL   Resp Panel by RT-PCR (Flu A&B, Covid) Nasopharyngeal Swab     Status: None   Collection Time: 08/26/20  4:42 PM   Specimen: Nasopharyngeal Swab; Nasopharyngeal(NP) swabs in vial transport medium  Result Value Ref Range   SARS Coronavirus 2 by RT PCR NEGATIVE NEGATIVE    Comment: (NOTE) SARS-CoV-2 target nucleic acids are NOT DETECTED.  The SARS-CoV-2 RNA is generally detectable in upper respiratory specimens during the acute phase of infection. The lowest concentration of SARS-CoV-2 viral copies this assay can detect is 138 copies/mL. A negative result does not preclude SARS-Cov-2 infection and should not be used as the sole basis for treatment or other patient management decisions. A negative result may occur with  improper specimen collection/handling, submission of specimen other than  nasopharyngeal swab, presence of viral mutation(s) within the areas targeted by this assay, and inadequate number of viral copies(<138 copies/mL). A negative result must be combined with clinical observations, patient history, and  epidemiological information. The expected result is Negative.  Fact Sheet for Patients:  EntrepreneurPulse.com.au  Fact Sheet for Healthcare Providers:  IncredibleEmployment.be  This test is no t yet approved or cleared by the [place] FDA and  has been authorized for detection and/or diagnosis of SARS-CoV-2 by FDA under an Emergency Use Authorization (EUA). This EUA will remain  in effect (meaning this test can be used) for the duration of the COVID-19 declaration under Section 564(b)(1) of the Act, 21 U.S.C.section 360bbb-3(b)(1), unless the authorization is terminated  or revoked sooner.       Influenza A by PCR NEGATIVE NEGATIVE   Influenza B by PCR NEGATIVE NEGATIVE    Comment: (NOTE) The Xpert Xpress SARS-CoV-2/FLU/RSV plus assay is intended as an aid in the diagnosis of influenza from Nasopharyngeal swab specimens and should not be used as a sole basis for treatment. Nasal washings and aspirates are unacceptable for Xpert Xpress SARS-CoV-2/FLU/RSV testing.  Fact Sheet for Patients: EntrepreneurPulse.com.au  Fact Sheet for Healthcare Providers: IncredibleEmployment.be  This test is not yet approved or cleared by the [place] FDA and has been authorized for detection and/or diagnosis of SARS-CoV-2 by FDA under an Emergency Use Authorization (EUA). This EUA will remain in effect (meaning this test can be used) for the duration of the COVID-19 declaration under Section 564(b)(1) of the Act, 21 U.S.C. section 360bbb-3(b)(1), unless the authorization is terminated or revoked.  Performed at [facility], [place] [address], [place], [place] [address]   Troponin  I (High Sensitivity)     Status: None   Collection Time: 08/26/20  7:33 PM  Result Value Ref Range   Troponin I (High Sensitivity) 3 <18 ng/L    Comment: (NOTE) Elevated high sensitivity troponin I (hsTnI) values and significant  changes across serial measurements may suggest ACS but many other  chronic and acute conditions are known to elevate hsTnI results.  Refer to the "Links" section for chest pain algorithms and additional  guidance. Performed at [facility], [place] [address] [place], [place] [address]   I-Stat arterial blood gas, ED     Status: Abnormal   Collection Time: 08/27/20 12:29 AM  Result Value Ref Range   pH, Arterial 7.404 7.35 - 7.45   pCO2 arterial 28.8 (L) 32 - 48 mmHg   pO2, Arterial 95 83 - 108 mmHg   Bicarbonate 18.0 (L) 20.0 - 28.0 mmol/L   TCO2 19 (L) 22 - 32 mmol/L   O2 Saturation 98.0 %   Acid-base deficit 6.0 (H) 0.0 - 2.0 mmol/L   Sodium 136 135 - 145 mmol/L   Potassium 4.3 3.5 - 5.1 mmol/L   Calcium, Ion 1.21 1.15 - 1.40 mmol/L   HCT 36.0 36 - 46 %   Hemoglobin 12.2 12.0 - 15.0 g/dL   Patient temperature 98.7 F    Collection site Radial    Drawn by RT    Sample type ARTERIAL   Brain natriuretic peptide     Status: None   Collection Time: 08/27/20  7:35 AM  Result Value Ref Range   B Natriuretic Peptide 21.7 0.0 - 100.0 pg/mL    Comment: Performed at [facility], [place] [address] [place], [place] [address]  HIV Antibody (routine testing w rflx)     Status: None   Collection Time: 08/27/20  7:35 AM  Result Value Ref Range   HIV Screen 4th Generation wRfx Non Reactive Non Reactive    Comment: Performed at [facility], [place] [address] [place], [place] [address]  Comprehensive metabolic panel     Status: Abnormal   Collection Time: 08/27/20  7:35 AM  Result Value Ref Range   Sodium 134 (L) 135 - 145 mmol/L   Potassium 4.5 3.5 - 5.1 mmol/L   Chloride 101 98 - 111 mmol/L   CO2 20 (L) 22 - 32 mmol/L   Glucose, Bld 279 (H) 70 -  99 mg/dL    Comment: Glucose reference range applies only to samples taken after fasting for at least 8 hours.   BUN 18 6 - 20 mg/dL   Creatinine, Ser 1.29 (H) 0.44 - 1.00 mg/dL   Calcium 9.7 8.9 - 10.3 mg/dL   Total Protein 7.6 6.5 - 8.1 g/dL   Albumin 3.4 (L) 3.5 - 5.0 g/dL   AST 18 15 - 41 U/L   ALT 25 0 - 44 U/L   Alkaline Phosphatase 83 38 - 126 U/L   Total Bilirubin 0.4 0.3 - 1.2 mg/dL   GFR, Estimated 51 (L) >60 mL/min    Comment: (NOTE) Calculated using the CKD-EPI Creatinine Equation (2021)    Anion gap 13 5 - 15    Comment: Performed at [facility] [address], [place], [place] [address]  CBC with Differential/Platelet     Status: Abnormal   Collection Time: 08/27/20  7:35 AM  Result Value Ref Range   WBC 11.3 (H) 4.0 - 10.5 K/uL   RBC 4.00 3.87 - 5.11 MIL/uL   Hemoglobin 11.2 (L) 12.0 - 15.0 g/dL   HCT 34.8 (L) 36 - 46 %   MCV 87.0 80.0 - 100.0 fL   MCH 28.0 26.0 - 34.0 pg   MCHC 32.2 30.0 - 36.0 g/dL   RDW 14.0 11.5 - 15.5 %   Platelets 393 150 - 400 K/uL   nRBC 0.0 0.0 - 0.2 %   Neutrophils Relative % 72 %   Neutro Abs 8.2 (H) 1.7 - 7.7 K/uL   Lymphocytes Relative 23 %   Lymphs Abs 2.6 0.7 - 4.0 K/uL   Monocytes Relative 4 %   Monocytes Absolute 0.5 0.1 - 1.0 K/uL   Eosinophils Relative 0 %   Eosinophils Absolute 0.0 0.0 - 0.5 K/uL   Basophils Relative 0 %   Basophils Absolute 0.0 0.0 - 0.1 K/uL   Immature Granulocytes 1 %   Abs Immature Granulocytes 0.07 0.00 - 0.07 K/uL    Comment: Performed at [facility], [place] [address], [place], [place] [address]  CBG monitoring, ED     Status: Abnormal   Collection Time: 08/27/20  8:25 AM  Result Value Ref Range   Glucose-Capillary 227 (H) 70 - 99 mg/dL    Comment: Glucose reference range applies only to samples taken after fasting for at least 8 hours.   Comment 1 Notify RN    Comment 2 Document in Chart   Glucose, capillary     Status: Abnormal   Collection Time: 08/27/20 11:23 AM  Result  Value Ref Range   Glucose-Capillary 206 (H) 70 - 99 mg/dL    Comment: Glucose reference range applies only to samples taken after fasting for at least 8 hours.  Glucose, capillary     Status: Abnormal   Collection Time: 08/27/20  4:21 PM  Result Value Ref Range   Glucose-Capillary 229 (H) 70 - 99 mg/dL    Comment: Glucose reference range applies only to samples taken after fasting for at  least 8 hours.  Glucose, capillary     Status: Abnormal   Collection Time: 08/27/20  7:59 PM  Result Value Ref Range   Glucose-Capillary 232 (H) 70 - 99 mg/dL    Comment: Glucose reference range applies only to samples taken after fasting for at least 8 hours.  Glucose, capillary     Status: Abnormal   Collection Time: 08/28/20  6:42 AM  Result Value Ref Range   Glucose-Capillary 255 (H) 70 - 99 mg/dL    Comment: Glucose reference range applies only to samples taken after fasting for at least 8 hours.  CBC     Status: Abnormal   Collection Time: 08/28/20  6:48 AM  Result Value Ref Range   WBC 11.3 (H) 4.0 - 10.5 K/uL   RBC 3.71 (L) 3.87 - 5.11 MIL/uL   Hemoglobin 10.7 (L) 12.0 - 15.0 g/dL   HCT 31.9 (L) 36 - 46 %   MCV 86.0 80.0 - 100.0 fL   MCH 28.8 26.0 - 34.0 pg   MCHC 33.5 30.0 - 36.0 g/dL   RDW 14.3 11.5 - 15.5 %   Platelets 396 150 - 400 K/uL   nRBC 0.0 0.0 - 0.2 %    Comment: Performed at [facility], [place] [address] [place], [place] [address]  Basic metabolic panel     Status: Abnormal   Collection Time: 08/28/20  6:48 AM  Result Value Ref Range   Sodium 136 135 - 145 mmol/L   Potassium 4.2 3.5 - 5.1 mmol/L   Chloride 103 98 - 111 mmol/L   CO2 22 22 - 32 mmol/L   Glucose, Bld 279 (H) 70 - 99 mg/dL    Comment: Glucose reference range applies only to samples taken after fasting for at least 8 hours.   BUN 23 (H) 6 - 20 mg/dL   Creatinine, Ser 1.29 (H) 0.44 - 1.00 mg/dL   Calcium 8.9 8.9 - 10.3 mg/dL   GFR, Estimated 51 (L) >60 mL/min    Comment: (NOTE) Calculated using the  CKD-EPI Creatinine Equation (2021)    Anion gap 11 5 - 15    Comment: Performed at [facility] [address], [place], [place] [address]  Glucose, capillary     Status: Abnormal   Collection Time: 08/28/20  9:15 AM  Result Value Ref Range   Glucose-Capillary 240 (H) 70 - 99 mg/dL    Comment: Glucose reference range applies only to samples taken after fasting for at least 8 hours.    CT Angio Chest PE W and/or Wo Contrast  Result Date: 08/26/2020 CLINICAL DATA:  Short of breath. Blood streaked sputum. Centralized chest pain for 1 week. EXAM: CT ANGIOGRAPHY CHEST WITH CONTRAST TECHNIQUE: Multidetector CT imaging of the chest was performed using the standard protocol during bolus administration of intravenous contrast. Multiplanar CT image reconstructions and MIPs were obtained to evaluate the vascular anatomy. CONTRAST:  33mL OMNIPAQUE IOHEXOL 350 MG/ML SOLN COMPARISON:  03/22/2019.  Current chest radiograph. FINDINGS: Cardiovascular: Pulmonary arteries are well opacified. There is no evidence of a pulmonary embolism. Heart is normal in size and configuration. No pericardial effusion. No coronary artery calcifications. Normal great vessels. Mediastinum/Nodes: Well-positioned tracheostomy tube stable from the prior exam. No neck base, axillary, mediastinal or hilar masses or enlarged lymph nodes. Trachea and esophagus are unremarkable. Lungs/Pleura: Lungs are clear. No pleural effusion or pneumothorax. Upper Abdomen: Fatty infiltration of the liver.  No acute findings. Musculoskeletal: No fracture or acute finding.  No bone lesion. No chest wall masses. Review of the MIP images confirms the above findings. IMPRESSION: 1. No evidence of a pulmonary embolism. 2. No acute findings.  Clear lungs. Electronically Signed   By: Lajean Manes M.D.   On: 08/26/2020 18:55   DG Chest Portable 1 View  Result Date: 08/26/2020 CLINICAL DATA:  Chest pain and shortness of breath. Bloody sputum coming from  tracheostomy. EXAM: PORTABLE CHEST 1 VIEW COMPARISON:  March 25, 2019 FINDINGS: The tracheostomy tube is stable. The cardiomediastinal silhouette is unchanged. No pneumothorax. No nodules or masses. No focal infiltrates. IMPRESSION: No active disease. Electronically Signed   By: Dorise Bullion III M.D   On: 08/26/2020 17:12    ROS: Otherwise negative   PE: Patient has clear voice when speaking.   Fiberoptic bronchoscopy was performed with a fiberoptic laryngoscope through the tracheostomy.  The tracheostomy is well within the lumen of the trachea.  The trachea is clear distal to the tracheostomy.  The carina can be easily visualized with no stenosis distal to the tracheostomy and normal appearing trachea mucosa.  There is no swelling of the neck and no bleeding around the tracheostomy skin.  Assessment/Plan: The source of bleeding is most likely in the subglottic area where she has had chronic problems and would recommend follow-up with her physicians at [facility] for further evaluation and treatment. There is no obvious source of bleeding distal to the present tracheostomy.  Patient is having no airway problems presently.  Melony Overly 08/28/2020, 12:06 PM

## 2020-08-28 NOTE — Plan of Care (Signed)

## 2020-08-28 NOTE — Plan of Care (Signed)
  Problem: Coping: Goal: Level of anxiety will decrease 08/28/2020 2336 by Dedra Skeens, RN Outcome: Progressing 08/28/2020 2336 by Dedra Skeens, RN Outcome: Progressing 08/28/2020 2321 by Dedra Skeens, RN Outcome: Progressing   Problem: Pain Managment: Goal: General experience of comfort will improve 08/28/2020 2336 by Dedra Skeens, RN Outcome: Progressing 08/28/2020 2336 by Dedra Skeens, RN Outcome: Progressing 08/28/2020 2321 by Dedra Skeens, RN Outcome: Progressing

## 2020-08-28 NOTE — Progress Notes (Signed)
Inpatient Diabetes Program Recommendations  AACE/ADA: New Consensus Statement on Inpatient Glycemic Control   Target Ranges:  Prepandial:   less than 140 mg/dL      Peak postprandial:   less than 180 mg/dL (1-2 hours)      Critically ill patients:  140 - 180 mg/dL   Results for CHANNEL, PAPANDREA (MRN [id number]) as of 08/28/2020 10:28  Ref. Range 08/27/2020 08:25 08/27/2020 11:23 08/27/2020 16:21 08/27/2020 19:59 08/28/2020 06:42 08/28/2020 09:15  Glucose-Capillary Latest Ref Range: 70 - 99 mg/dL 227 (H) 206 (H) 229 (H) 232 (H) 255 (H) 240 (H)   Review of Glycemic Control  Diabetes history: DM2 Outpatient Diabetes medications: Tresiba 140 units daily, Ozempic 1 mg Qweek Current orders for Inpatient glycemic control: Lantus 70 units daily, Novolog 0-9 units TID with meals  Inpatient Diabetes Program Recommendations:    Insulin: Please consider increasing Lantus to 75 units daily, adding Novolog 0-5 units QHS for bedtime correction, and ordering Novolog 4 units TID with meals for meal coverage if patient eats at least 50% of meals.  Thanks, Barnie Alderman, RN, MSN, CDE Diabetes Coordinator Inpatient Diabetes Program [phone] (Team Pager from 8am to 5pm)

## 2020-08-28 NOTE — Plan of Care (Signed)
  Problem: Education: °Goal: Knowledge of General Education information will improve °Description: Including pain rating scale, medication(s)/side effects and non-pharmacologic comfort measures °Outcome: Progressing °  °Problem: Health Behavior/Discharge Planning: °Goal: Ability to manage health-related needs will improve °Outcome: Progressing °  °Problem: Activity: °Goal: Risk for activity intolerance will decrease °Outcome: Progressing °  °Problem: Nutrition: °Goal: Adequate nutrition will be maintained °Outcome: Progressing °  °Problem: Coping: °Goal: Level of anxiety will decrease °Outcome: Progressing °  °Problem: Elimination: °Goal: Will not experience complications related to bowel motility °Outcome: Progressing °Goal: Will not experience complications related to urinary retention °Outcome: Progressing °  °Problem: Safety: °Goal: Ability to remain free from injury will improve °Outcome: Progressing °  °Problem: Skin Integrity: °Goal: Risk for impaired skin integrity will decrease °Outcome: Progressing °  °

## 2020-08-29 ENCOUNTER — Inpatient Hospital Stay: Payer: Medicare Other

## 2020-08-29 ENCOUNTER — Ambulatory Visit: Payer: Medicare Other | Admitting: Physician Assistant

## 2020-08-29 DIAGNOSIS — R0600 Dyspnea, unspecified: Secondary | ICD-10-CM | POA: Diagnosis not present

## 2020-08-29 DIAGNOSIS — Z930 Tracheostomy status: Secondary | ICD-10-CM | POA: Diagnosis not present

## 2020-08-29 DIAGNOSIS — E1322 Other specified diabetes mellitus with diabetic chronic kidney disease: Secondary | ICD-10-CM | POA: Diagnosis not present

## 2020-08-29 DIAGNOSIS — I10 Essential (primary) hypertension: Secondary | ICD-10-CM | POA: Diagnosis not present

## 2020-08-29 DIAGNOSIS — Z23 Encounter for immunization: Secondary | ICD-10-CM

## 2020-08-29 LAB — GLUCOSE, CAPILLARY
Glucose-Capillary: 124 mg/dL — ABNORMAL HIGH (ref 70–99)
Glucose-Capillary: 157 mg/dL — ABNORMAL HIGH (ref 70–99)

## 2020-08-29 LAB — BASIC METABOLIC PANEL
Anion gap: 9 (ref 5–15)
BUN: 30 mg/dL — ABNORMAL HIGH (ref 6–20)
CO2: 25 mmol/L (ref 22–32)
Calcium: 8.9 mg/dL (ref 8.9–10.3)
Chloride: 104 mmol/L (ref 98–111)
Creatinine, Ser: 1.26 mg/dL — ABNORMAL HIGH (ref 0.44–1.00)
GFR, Estimated: 52 mL/min — ABNORMAL LOW (ref >60)
Glucose, Bld: 156 mg/dL — ABNORMAL HIGH (ref 70–99)
Potassium: 4.2 mmol/L (ref 3.5–5.1)
Sodium: 138 mmol/L (ref 135–145)

## 2020-08-29 LAB — CBC
HCT: 34.5 % — ABNORMAL LOW (ref 36.0–46.0)
Hemoglobin: 10.9 g/dL — ABNORMAL LOW (ref 12.0–15.0)
MCH: 28.2 pg (ref 26.0–34.0)
MCHC: 31.6 g/dL (ref 30.0–36.0)
MCV: 89.1 fL (ref 80.0–100.0)
Platelets: 365 10*3/uL (ref 150–400)
RBC: 3.87 MIL/uL (ref 3.87–5.11)
RDW: 14.3 % (ref 11.5–15.5)
WBC: 11.6 10*3/uL — ABNORMAL HIGH (ref 4.0–10.5)
nRBC: 0 % (ref 0.0–0.2)

## 2020-08-29 IMAGING — MR MR LUMBAR SPINE W/O CM
4 of 5 series · 19 of 48 positions shown · non-contrast
Comparison: 12/17/2016

CLINICAL DATA: Low back pain radiating to the right lower extremity
with numbness

EXAM:
MRI LUMBAR SPINE WITHOUT CONTRAST
TECHNIQUE: Multiplanar, multisequence MR imaging of the lumbar spine was
performed. No intravenous contrast was administered.

[Series 5: T2 · sagittal · 4.0mm · 0.73mm/px · 6 of 14 slices shown (1 of 2)]
[im 1/14]
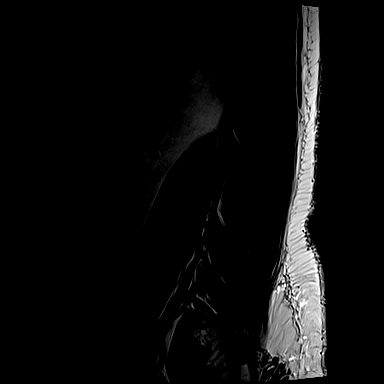
[im 3/14]
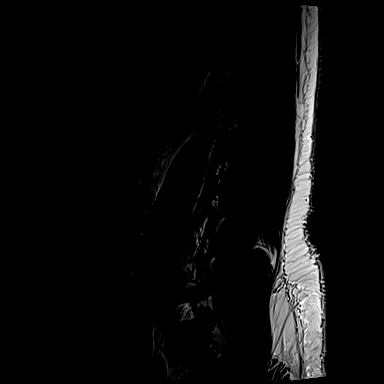
[im 6/14]
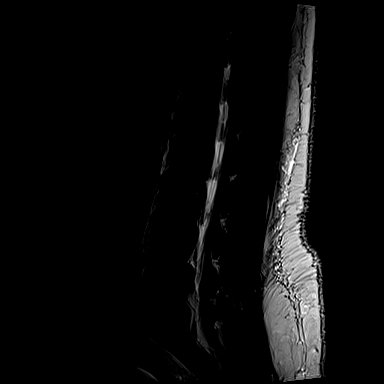
[im 8/14]
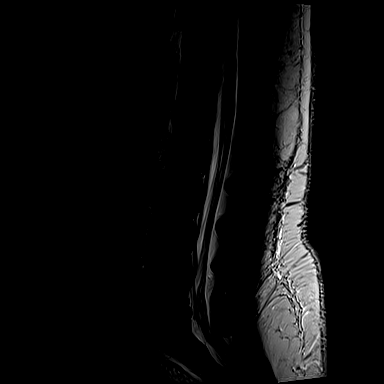
[im 11/14]
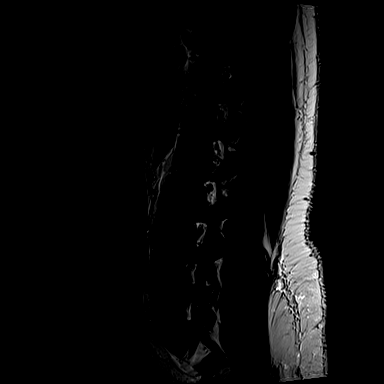
[im 14/14]
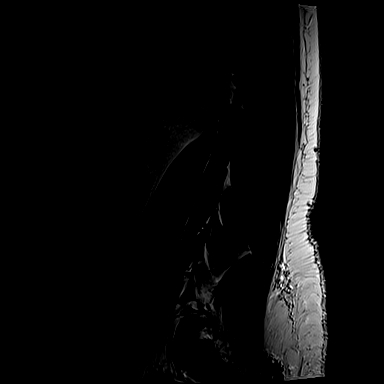

[Series 6: T1 · sagittal · 4.0mm · 0.88mm/px · 3 of 14 slices shown (1 of 2)]
[im 3/14]
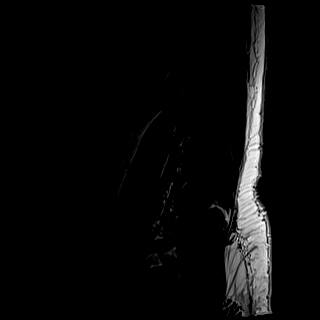
[im 8/14]
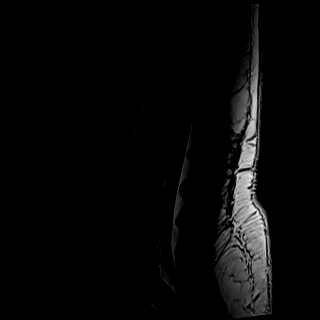
[im 14/14]
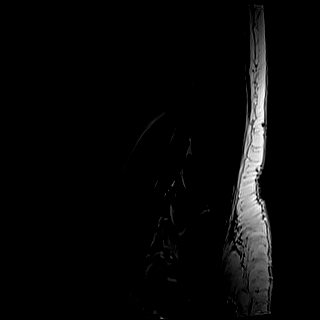

[Series 12: T2 · axial · 4.0mm · 0.28mm/px · z∈[-84,+71]mm · 7 of 38 slices shown (2 of 2)]
[im 1/38]
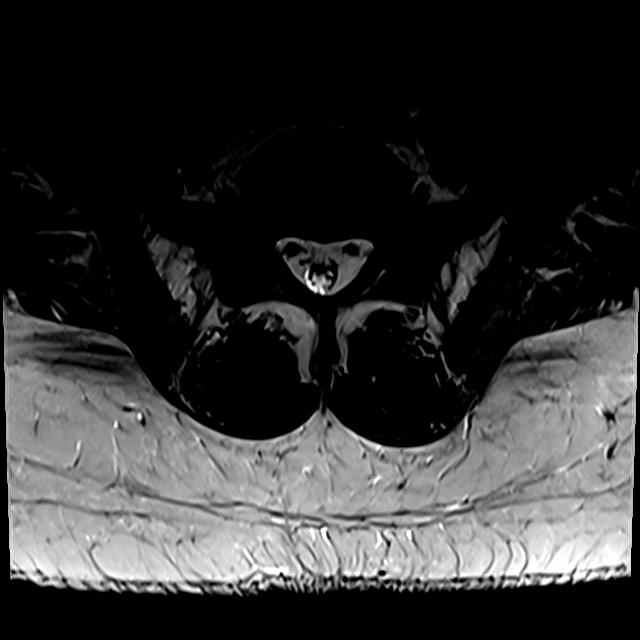
[im 6/38]
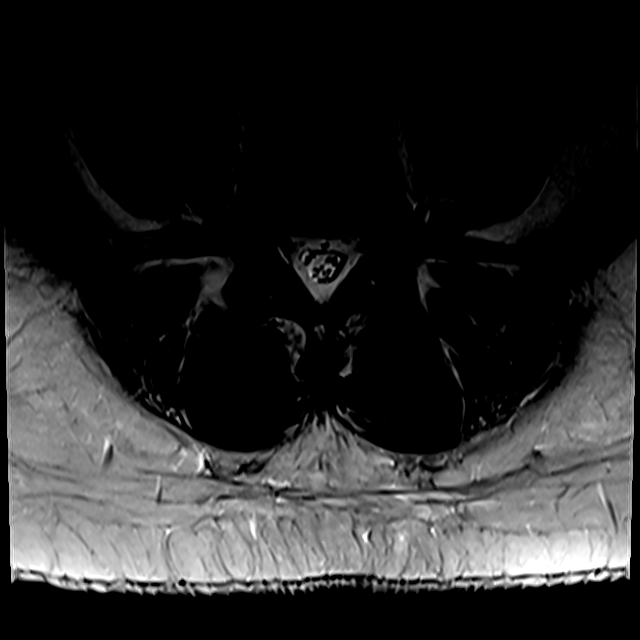
[im 11/38]
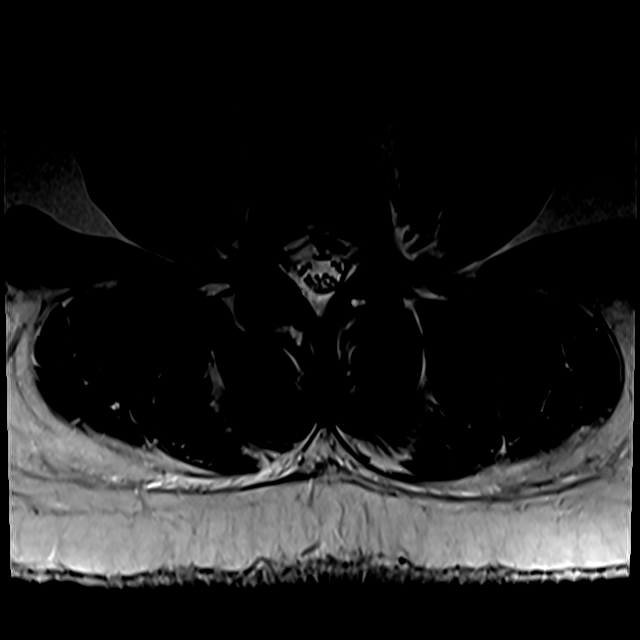
[im 16/38]
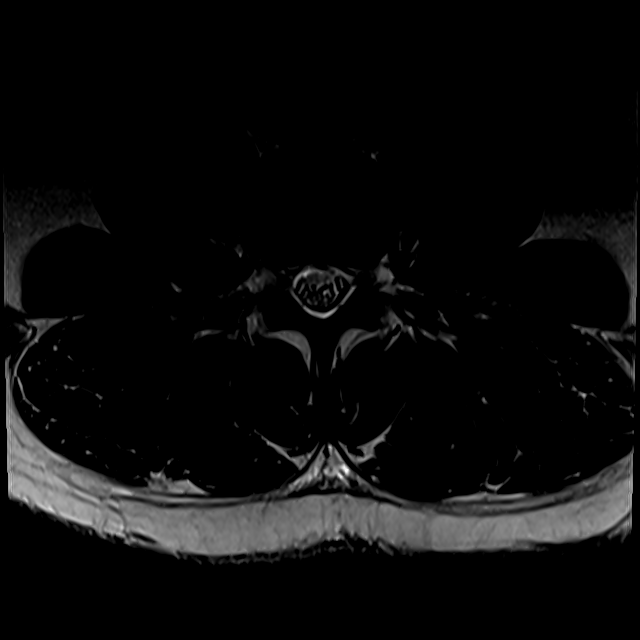
[im 19/38]
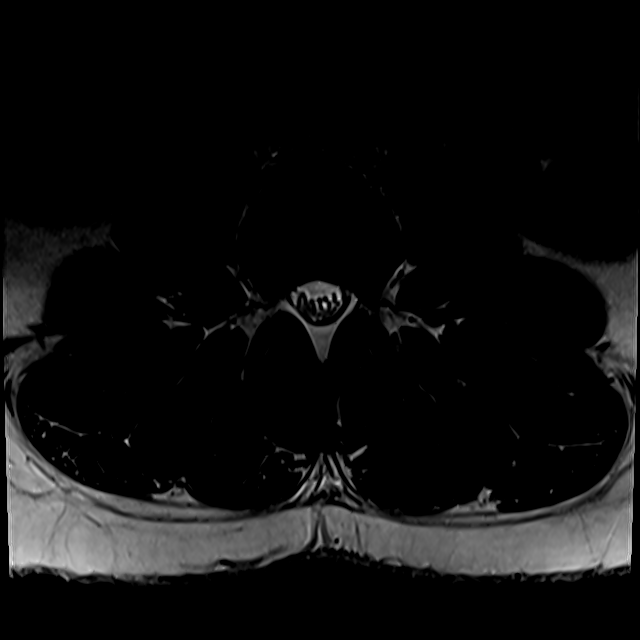
[im 22/38]
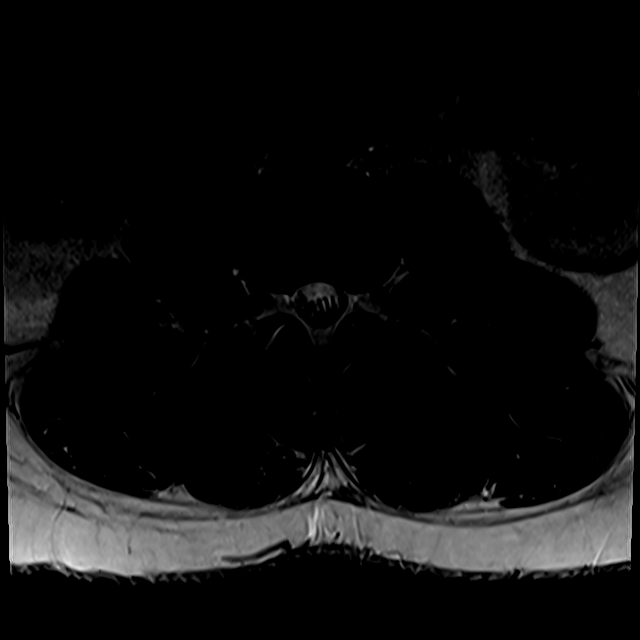
[im 32/38]
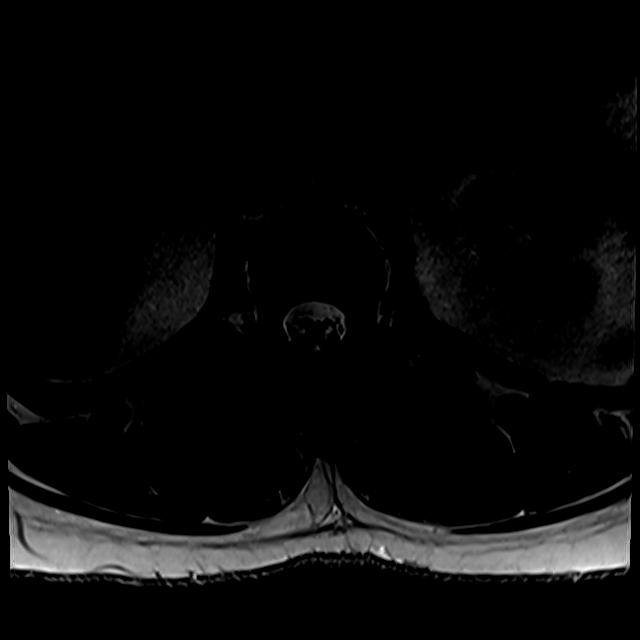

[Series 100: T1 · axial · 4.0mm · 0.28mm/px · z∈[-59,+71]mm · 3 of 38 slices shown (2 of 2)]
[im 6/38]
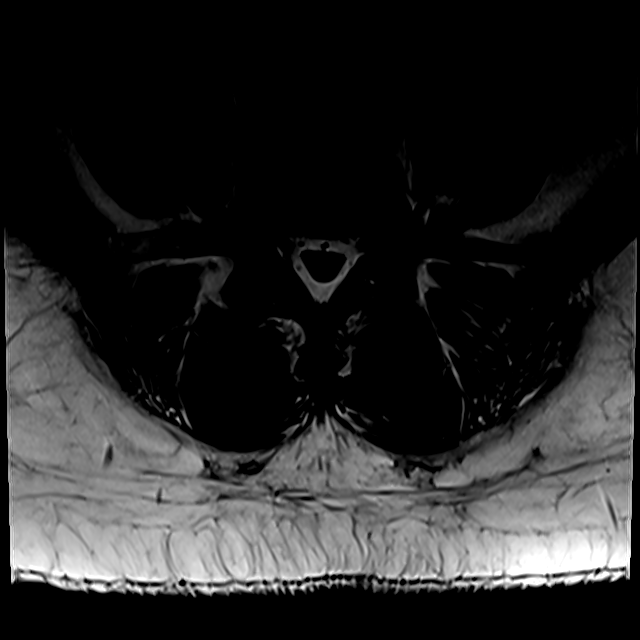
[im 19/38]
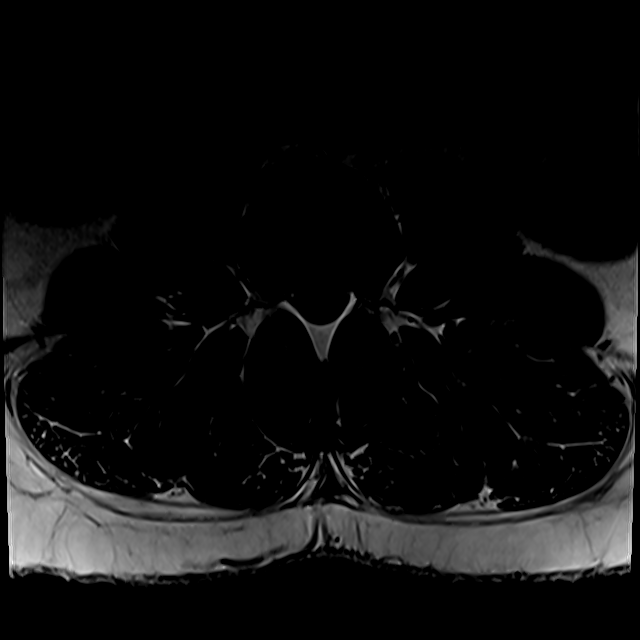
[im 32/38]
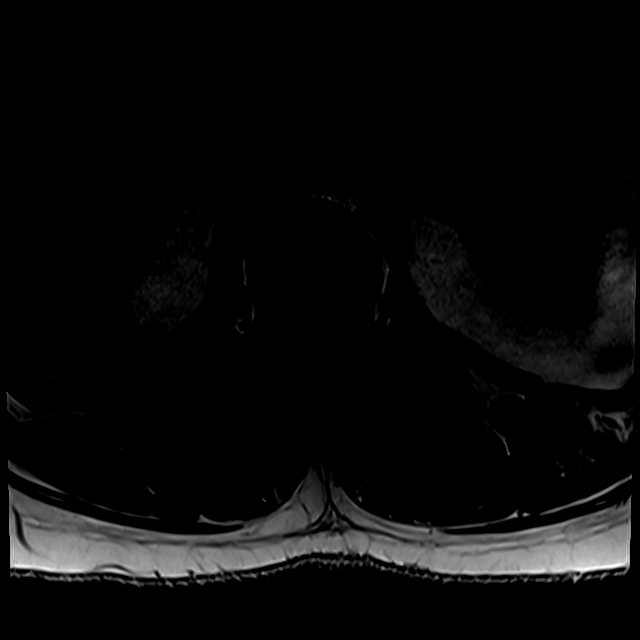

[19 of 48 positions shown; findings below may reference images not displayed]

FINDINGS: Segmentation:  Standard.

Alignment:  Physiologic.

Vertebrae:  No fracture, evidence of discitis, or bone lesion.

Conus medullaris and cauda equina: Conus extends to the L1 level.
Conus and cauda equina appear normal.

Paraspinal and other soft tissues: Epidural lipomatosis

Disc levels:

The disc spaces from T10-L4 are normal.

L4-5: Small central disc extrusion with superior migration to the
infrapedicle level. This mildly narrows the left lateral recess.
Spinal canal patency is improved compared to the prior study. There
is no neural foraminal stenosis.

L5-S1: Normal.
IMPRESSION: Small L4-5 disc extrusion with superior migration, which mildly
narrows the left lateral recess. Narrowing of the thecal sac is
improved compared to 12/17/2016.

## 2020-08-29 NOTE — Progress Notes (Signed)
Discharge instructions (including medications) discussed with and copy provided to patient/caregiver. Pt stated understanding.

## 2020-08-29 NOTE — Discharge Instructions (Signed)
You were hospitalized at [facility] due to difficulty breathing.  We expect this is from chronic issues and associated with your tracheostomy. We did several blood tests, imaging and an ENT physician performed a scope while you were in the hospital. All of these studies were normal. We scheduled you an outpatient follow up with Dr. Jerilee Hoh next Wednesday 12/15 at [place]. Be sure to follow-up with your regularly scheduled appointments.  Please also be sure to follow-up with your primary care physician at your earliest convenience.  Thank you for allowing [place] to take care of you.  Take care, [facility] family medicine team  You have an appointment with Dr. Jerilee Hoh on 12/15 at 4 PM.   Tracheostomy and Tracheostomy Tube Safety and Care, Adult  A tracheostomy, or trach (rhymes with "take"), is a surgically created opening in the trachea to help with breathing. A tracheostomy tube, or trach tube, is a tube that is placed in this opening to help with breathing. Good care of your trach and trach tube will help you stay safe and free from infection. How to care for your trach and trach tube Follow instructions from your health care provider about:  Cleaning your trach and the area around it.  Suctioning your trach.  Cleaning your trach tube.  Changing your trach tube.  Keeping your trach ties, the area around your trach, and your trach tube clean and dry. When you take a shower: ? Cover your trach with a shower shield or other protective covering. ? Try not to point the shower spray at your trach. How to stay safe with a trach and trach tube General instructions  Keep clothing away from your trach tube. Clothing can block the tube. When dressing: ? Avoid wearing crew-neck shirts and turtlenecks. ? Wear V-neck shirts and open-collar shirts or blouses. ? You may wear a scarf over the trach tube. ? Do not wear clothes that shed fibers or lint.  Do not put anything in your trach tube that  should not be there.  Use a humidifier to keep some moisture in the air. This will keep your airway and lungs from getting dry. Clean the humidifier regularly to prevent mold and mildew from building up.  Avoid dust, mold, smoke, and fumes from cleaning solutions, such as ammonia or bleach.  When you use any kind of spray or powder, cover your trach tube. Do not inhale the spray or powder. Before you leave home:  (If it is cold outside) Wear a filter to keep cold air from entering your trach tube and irritating your trachea and lungs. You can also loosely cover your trach tube with a protective scarf, handkerchief, or gauze. Wearing a scarf helps to warm the air as you breathe.  (If it is windy) Consider covering your trach tube with a scarf. This will help to keep dust or dirt from getting into your tube.  Always carry your emergency travel kit with you when you leave the house. Your kit should include: ? A portable suction machine. ? Suction catheters. ? A mucus trap. ? A bulb syringe. ? Two trach tubes. One should be the same size as your current tube, and the other should be smaller. ? [place]. ? A heat and moisture exchanger. ? Emergency phone numbers. ? Sterile water. ? 0.9% saline solution. ? Sterile gloves. ? Hand sanitizer. ? Sterile gauze pads. If you get sick:  Suction your trach and trach tube more often.  Do these things if  you vomit: ? Cover your trach tube with a towel to keep vomit out of your airway. ? Suction right away if you think vomit may have gotten into your trach tube. If you use a ventilator:  Check the ventilator safety and sound alarms every day to be sure they work.  Make sure the ventilator tubes are in the right place and are not pulling on the trach tube.  Do not twist or pull on the trach connector more than needed. Too much pulling or twisting can cause the ventilator tubes to disconnect.  Hold the trach tube in place while you hook up or  disconnect the ventilator or humidification tubing.  Always use an inner cannula without side openings (non-fenestrated cannula) with the correct connector if you are using a trach tube that has one or more side openings (fenestrated trach tube). Contact a health care provider if:  You cannot remove your trach tube or inner cannula.  You have redness, swelling, tenderness, or sores around your trach.  You have fluid or blood coming from your trach.  Your trach feels warm to the touch.  You have pus or a bad smell coming from your trach.  You have a fever or chills.  You have a cough that gets worse. Get help right away if:  You have chest pain or trouble breathing, even after you suction and clean your trach tube.  You have a fever for more than 2-3 days.  You have nausea and vomiting.  You feel dizzy or you faint.  You have trouble swallowing.  You have unusual sounds coming from the airway.  You continue to cough or have shortness of breath after suctioning.  There is bright red blood in your mucus.  You have thick mucus or your tube gets plugged and you cannot clear it.  Your tube falls out and cannot be put back in. Summary  A tracheostomy, or trach (rhymes with "take"), is a surgically created opening in the trachea to help with breathing. A tracheostomy tube, or trach tube, is a tube that is placed in this opening to help with breathing.  Follow instructions from your health care provider about how to take care of your trach.  Do not put anything in your trach tube that should not be there.  Always carry your emergency travel kit with you when you leave the house.  Get help right away if you have chest pain or trouble breathing, or if your tube falls out and cannot be put back in. This information is not intended to replace advice given to you by your health care provider. Make sure you discuss any questions you have with your health care provider. Document  Revised: 06/07/2018 Document Reviewed: 06/07/2018 Elsevier Patient Education  [place].

## 2020-08-29 NOTE — Progress Notes (Signed)
   Covid-19 Vaccination Clinic  Name:  [name]    MRN: [id number] DOB: 1971-06-12  08/29/2020  [name] was observed post Covid-19 immunization for 15 minutes without incident. She was provided with Vaccine Information Sheet and instruction to access the V-Safe system.   [name] was instructed to call 911 with any severe reactions post vaccine: Marland Kitchen Difficulty breathing  . Swelling of face and throat  . A fast heartbeat  . A bad rash all over body  . Dizziness and weakness   Immunizations Administered    Name Date Dose VIS Date Route   Pfizer COVID-19 Vaccine 08/29/2020 11:25 AM 0.3 mL 07/11/2020 Intramuscular   Manufacturer: [place]   Lot: [id number]   NDC: [address]-1

## 2020-08-29 NOTE — Consult Note (Signed)
   [facility] CM Inpatient Consult   08/29/2020  Omeka Mae Dils 10/08/1970 [id number]  [facility] [[facility]]  Accountable Care Organization [ACO] Patient: [employer] Medicare  Patient screened for high risk score for unplanned readmission score and for hospitalizations to check if potential [facility]  [[facility]] Care Management service needs.  Review of patient's medical record reveals patient is transitioning home, denies needs for home health.  Met with patient at bedside to explain [facility] Care management program and eligible benefits for post hospital follow up.  Patient denies any needs but she did accept the brochure and 24 hour nurse advise line information, encouraged to call for needs.  Patient endorses Primary Care Provider is with [facility] this provider is listed to provide the transition of care [TOC] for post hospital follow up.   Plan:  Patient states no needs, information for contact was given.  For questions contact:    , RN BSN CCM [facility] Hospital Liaison  [phone] business mobile phone Toll free office [phone]  Fax number: [phone] [email or web address]      

## 2020-08-29 NOTE — TOC Transition Note (Signed)
Transition of Care [facility]) - CM/SW Discharge Note   Patient Details  Name: [name] MRN: [id number] Date of Birth: 17-Oct-1970  Transition of Care [facility]) CM/SW Contact:  Carles Collet, RN Phone Number: 08/29/2020, 10:53 AM   Clinical Narrative:   [name] w patient at bedside. She declines [place] services. She states she has all needed DME for trach, she has been managing trach since 2018. No CM needs identified.     Final next level of care: Home/Self Care Barriers to Discharge: No Barriers Identified   Patient Goals and CMS Choice        Discharge Placement                       Discharge Plan and Services                          HH Arranged: Patient Refused [place]          Social Determinants of Health (SDOH) Interventions     Readmission Risk Interventions No flowsheet data found.

## 2020-08-29 NOTE — Plan of Care (Signed)

## 2020-08-29 NOTE — Plan of Care (Signed)
  Problem: Education: Goal: Knowledge of General Education information will improve Description: Including pain rating scale, medication(s)/side effects and non-pharmacologic comfort measures Outcome: Adequate for Discharge   Problem: Health Behavior/Discharge Planning: Goal: Ability to manage health-related needs will improve Outcome: Adequate for Discharge   Problem: Clinical Measurements: Goal: Ability to maintain clinical measurements within normal limits will improve Outcome: Adequate for Discharge Goal: Will remain free from infection Outcome: Adequate for Discharge Goal: Diagnostic test results will improve Outcome: Adequate for Discharge Goal: Respiratory complications will improve Outcome: Adequate for Discharge Goal: Cardiovascular complication will be avoided Outcome: Adequate for Discharge   Problem: Activity: Goal: Risk for activity intolerance will decrease Outcome: Adequate for Discharge   Problem: Nutrition: Goal: Adequate nutrition will be maintained Outcome: Adequate for Discharge   Problem: Coping: Goal: Level of anxiety will decrease Outcome: Adequate for Discharge   Problem: Elimination: Goal: Will not experience complications related to bowel motility Outcome: Adequate for Discharge Goal: Will not experience complications related to urinary retention Outcome: Adequate for Discharge   Problem: Pain Managment: Goal: General experience of comfort will improve Outcome: Adequate for Discharge   Problem: Safety: Goal: Ability to remain free from injury will improve Outcome: Adequate for Discharge   Problem: Skin Integrity: Goal: Risk for impaired skin integrity will decrease Outcome: Adequate for Discharge   Problem: Acute Rehab PT Goals(only PT should resolve) Goal: Patient Will Transfer Sit To/From Stand Outcome: Adequate for Discharge Goal: Pt Will Ambulate Outcome: Adequate for Discharge Goal: Pt Will Go Up/Down Stairs Outcome: Adequate for  Discharge   

## 2020-08-30 ENCOUNTER — Telehealth: Payer: Self-pay

## 2020-08-30 NOTE — Telephone Encounter (Signed)
Transition Care Management Follow-up Telephone Call  Date of discharge and from where: 08/29/2020, [facility]   How have you been since you were released from the hospital? She said that she is doing alright however she is still coughing and her chest hurts when she coughs and she is also still bleeding from her tracheostomy when she coughs.  She said this bleeding was occurring in the hospital and the doctors at [facility] had contacted her doctor in [facility] about this issue. She has an appointment in [facility] on Wednesday 09/05/2020  She also stated that she understands when she is to return to the ED  Any questions or concerns? Yes - noted above  Items Reviewed:  Did the pt receive and understand the discharge instructions provided? Yes   Medications obtained and verified? Yes she has all medications and did not have any questions about her med regime.    Other? No   Any new allergies since your discharge? No } Do you have support at home? Yes ,she lives with her husband who works during the day.  Home Care and Equipment/Supplies: Were home health services ordered? no If so, what is the name of the agency? n/a  Has the agency set up a time to come to the patient's home?n/a Were any new equipment or medical supplies ordered?  No What is the name of the medical supply agency? n/a Were you able to get the supplies/equipment?  n/a Do you have any questions related to the use of the equipment or supplies? No, n/a  She has all the supplies needed for her tracheostomy as well as suction. Also has glucometer and nebulizer,  Receives  PCS 3x/week      Functional Questionnaire: (I = Independent and D = Dependent) ADLs: independent ,family provides assistance as needed  Follow up appointments reviewed:   PCP Hospital f/u appt confirmed? Yes  - Dr Margarita Rana 10/11/2019.  She said that it needed to be on a Wednesday and in the afternoon. This was the first available    [facility] f/u appt confirmed? Yes  - ENT 09/05/2020 .  Are transportation arrangements needed? No   If their condition worsens, is the pt aware to call PCP or go to the Emergency Dept.? yes  Was the patient provided with contact information for the PCP's office or ED?  She has the phone number for [facility] and also uses MyChart  Was to pt encouraged to call back with questions or concerns? yes

## 2020-08-30 NOTE — Telephone Encounter (Signed)
From the discharge call:    She said that she is doing alright however she is still coughing and her chest hurts when she coughs and she is also still bleeding from her tracheostomy when she coughs.  She said this bleeding was occurring in the hospital and the doctors at [facility] had contacted her doctor in [facility] about this issue. She has an appointment in [facility] on Wednesday 09/05/2020  She also stated that she understands when she is to return to the ED    she has all medications and did not have any questions about her med regime.       Appointment with Dr Margarita Rana - 10/11/2019.  She said that it needed to be on a Wednesday and in the afternoon. This was the first available

## 2020-08-31 NOTE — Telephone Encounter (Signed)
Noted  

## 2020-09-06 DIAGNOSIS — Z930 Tracheostomy status: Secondary | ICD-10-CM | POA: Diagnosis not present

## 2020-09-16 ENCOUNTER — Emergency Department (HOSPITAL_COMMUNITY): Payer: Medicare Other

## 2020-09-16 ENCOUNTER — Inpatient Hospital Stay (HOSPITAL_COMMUNITY)
Admission: EM | Admit: 2020-09-16 | Discharge: 2020-09-21 | DRG: 177 | Disposition: A | Discharge status: Home / Self Care | Payer: Medicare Other | Attending: Family Medicine | Admitting: Family Medicine

## 2020-09-16 ENCOUNTER — Other Ambulatory Visit: Payer: Self-pay

## 2020-09-16 ENCOUNTER — Encounter (HOSPITAL_COMMUNITY): Payer: Self-pay

## 2020-09-16 DIAGNOSIS — G4733 Obstructive sleep apnea (adult) (pediatric): Secondary | ICD-10-CM | POA: Diagnosis present

## 2020-09-16 DIAGNOSIS — M545 Low back pain, unspecified: Secondary | ICD-10-CM | POA: Diagnosis not present

## 2020-09-16 DIAGNOSIS — E1322 Other specified diabetes mellitus with diabetic chronic kidney disease: Secondary | ICD-10-CM | POA: Diagnosis not present

## 2020-09-16 DIAGNOSIS — Z794 Long term (current) use of insulin: Secondary | ICD-10-CM

## 2020-09-16 DIAGNOSIS — I10 Essential (primary) hypertension: Secondary | ICD-10-CM | POA: Diagnosis not present

## 2020-09-16 DIAGNOSIS — Z8261 Family history of arthritis: Secondary | ICD-10-CM

## 2020-09-16 DIAGNOSIS — M7989 Other specified soft tissue disorders: Secondary | ICD-10-CM | POA: Diagnosis not present

## 2020-09-16 DIAGNOSIS — Z87891 Personal history of nicotine dependence: Secondary | ICD-10-CM

## 2020-09-16 DIAGNOSIS — E109 Type 1 diabetes mellitus without complications: Secondary | ICD-10-CM

## 2020-09-16 DIAGNOSIS — G894 Chronic pain syndrome: Secondary | ICD-10-CM | POA: Diagnosis not present

## 2020-09-16 DIAGNOSIS — K2970 Gastritis, unspecified, without bleeding: Secondary | ICD-10-CM | POA: Diagnosis not present

## 2020-09-16 DIAGNOSIS — J4551 Severe persistent asthma with (acute) exacerbation: Secondary | ICD-10-CM | POA: Diagnosis not present

## 2020-09-16 DIAGNOSIS — E785 Hyperlipidemia, unspecified: Secondary | ICD-10-CM | POA: Diagnosis present

## 2020-09-16 DIAGNOSIS — Z930 Tracheostomy status: Secondary | ICD-10-CM

## 2020-09-16 DIAGNOSIS — F411 Generalized anxiety disorder: Secondary | ICD-10-CM | POA: Diagnosis present

## 2020-09-16 DIAGNOSIS — N289 Disorder of kidney and ureter, unspecified: Secondary | ICD-10-CM | POA: Diagnosis not present

## 2020-09-16 DIAGNOSIS — U071 COVID-19: Secondary | ICD-10-CM | POA: Diagnosis not present

## 2020-09-16 DIAGNOSIS — J4541 Moderate persistent asthma with (acute) exacerbation: Secondary | ICD-10-CM | POA: Diagnosis not present

## 2020-09-16 DIAGNOSIS — Z79899 Other long term (current) drug therapy: Secondary | ICD-10-CM

## 2020-09-16 DIAGNOSIS — F32A Depression, unspecified: Secondary | ICD-10-CM | POA: Diagnosis not present

## 2020-09-16 DIAGNOSIS — J9811 Atelectasis: Secondary | ICD-10-CM | POA: Diagnosis not present

## 2020-09-16 DIAGNOSIS — G43909 Migraine, unspecified, not intractable, without status migrainosus: Secondary | ICD-10-CM | POA: Diagnosis present

## 2020-09-16 DIAGNOSIS — K2990 Gastroduodenitis, unspecified, without bleeding: Secondary | ICD-10-CM | POA: Diagnosis present

## 2020-09-16 DIAGNOSIS — J398 Other specified diseases of upper respiratory tract: Secondary | ICD-10-CM | POA: Diagnosis not present

## 2020-09-16 DIAGNOSIS — Z833 Family history of diabetes mellitus: Secondary | ICD-10-CM

## 2020-09-16 DIAGNOSIS — R059 Cough, unspecified: Secondary | ICD-10-CM | POA: Diagnosis not present

## 2020-09-16 DIAGNOSIS — N179 Acute kidney failure, unspecified: Secondary | ICD-10-CM | POA: Diagnosis not present

## 2020-09-16 DIAGNOSIS — N1831 Chronic kidney disease, stage 3a: Secondary | ICD-10-CM

## 2020-09-16 DIAGNOSIS — E119 Type 2 diabetes mellitus without complications: Secondary | ICD-10-CM | POA: Diagnosis present

## 2020-09-16 DIAGNOSIS — J1282 Pneumonia due to coronavirus disease 2019: Secondary | ICD-10-CM | POA: Diagnosis not present

## 2020-09-16 DIAGNOSIS — Z8249 Family history of ischemic heart disease and other diseases of the circulatory system: Secondary | ICD-10-CM

## 2020-09-16 DIAGNOSIS — R7989 Other specified abnormal findings of blood chemistry: Secondary | ICD-10-CM | POA: Diagnosis present

## 2020-09-16 DIAGNOSIS — I959 Hypotension, unspecified: Secondary | ICD-10-CM | POA: Diagnosis not present

## 2020-09-16 DIAGNOSIS — R079 Chest pain, unspecified: Secondary | ICD-10-CM | POA: Diagnosis not present

## 2020-09-16 DIAGNOSIS — Z23 Encounter for immunization: Secondary | ICD-10-CM

## 2020-09-16 DIAGNOSIS — D649 Anemia, unspecified: Secondary | ICD-10-CM | POA: Diagnosis not present

## 2020-09-16 DIAGNOSIS — R0600 Dyspnea, unspecified: Secondary | ICD-10-CM | POA: Diagnosis not present

## 2020-09-16 DIAGNOSIS — Z6841 Body Mass Index (BMI) 40.0 and over, adult: Secondary | ICD-10-CM | POA: Diagnosis present

## 2020-09-16 DIAGNOSIS — E1022 Type 1 diabetes mellitus with diabetic chronic kidney disease: Secondary | ICD-10-CM | POA: Diagnosis not present

## 2020-09-16 DIAGNOSIS — J440 Chronic obstructive pulmonary disease with acute lower respiratory infection: Secondary | ICD-10-CM | POA: Diagnosis not present

## 2020-09-16 DIAGNOSIS — Z823 Family history of stroke: Secondary | ICD-10-CM

## 2020-09-16 DIAGNOSIS — E871 Hypo-osmolality and hyponatremia: Secondary | ICD-10-CM | POA: Diagnosis present

## 2020-09-16 DIAGNOSIS — Z90710 Acquired absence of both cervix and uterus: Secondary | ICD-10-CM

## 2020-09-16 DIAGNOSIS — R000 Tachycardia, unspecified: Secondary | ICD-10-CM | POA: Diagnosis not present

## 2020-09-16 DIAGNOSIS — G5600 Carpal tunnel syndrome, unspecified upper limb: Secondary | ICD-10-CM | POA: Diagnosis present

## 2020-09-16 DIAGNOSIS — R197 Diarrhea, unspecified: Secondary | ICD-10-CM | POA: Diagnosis present

## 2020-09-16 DIAGNOSIS — R1310 Dysphagia, unspecified: Secondary | ICD-10-CM | POA: Diagnosis present

## 2020-09-16 LAB — RESP PANEL BY RT-PCR (FLU A&B, COVID) ARPGX2
Influenza A by PCR: NEGATIVE
Influenza B by PCR: NEGATIVE
SARS Coronavirus 2 by RT PCR: POSITIVE — AB

## 2020-09-16 LAB — BASIC METABOLIC PANEL
Anion gap: 12 (ref 5–15)
BUN: 13 mg/dL (ref 6–20)
CO2: 20 mmol/L — ABNORMAL LOW (ref 22–32)
Calcium: 8.9 mg/dL (ref 8.9–10.3)
Chloride: 105 mmol/L (ref 98–111)
Creatinine, Ser: 1.19 mg/dL — ABNORMAL HIGH (ref 0.44–1.00)
GFR, Estimated: 56 mL/min — ABNORMAL LOW (ref >60)
Glucose, Bld: 169 mg/dL — ABNORMAL HIGH (ref 70–99)
Potassium: 3.9 mmol/L (ref 3.5–5.1)
Sodium: 137 mmol/L (ref 135–145)

## 2020-09-16 LAB — CBC
HCT: 36.5 % (ref 36.0–46.0)
Hemoglobin: 11.7 g/dL — ABNORMAL LOW (ref 12.0–15.0)
MCH: 28.1 pg (ref 26.0–34.0)
MCHC: 32.1 g/dL (ref 30.0–36.0)
MCV: 87.7 fL (ref 80.0–100.0)
Platelets: 317 10*3/uL (ref 150–400)
RBC: 4.16 MIL/uL (ref 3.87–5.11)
RDW: 13.9 % (ref 11.5–15.5)
WBC: 7.7 10*3/uL (ref 4.0–10.5)
nRBC: 0 % (ref 0.0–0.2)

## 2020-09-16 LAB — CBG MONITORING, ED: Glucose-Capillary: 233 mg/dL — ABNORMAL HIGH (ref 70–99)

## 2020-09-16 MED ORDER — ALBUTEROL SULFATE HFA 108 (90 BASE) MCG/ACT IN AERS: 2.0000 | INHALATION_SPRAY | Freq: Once | RESPIRATORY_TRACT | Status: DC | PRN

## 2020-09-16 MED ORDER — MAGNESIUM SULFATE 2 GM/50ML IV SOLN
2.0000 g | Freq: Once | INTRAVENOUS | Status: AC
  Administered 2020-09-16: 2 g via INTRAVENOUS
  Filled 2020-09-16: qty 50

## 2020-09-16 MED ORDER — FUROSEMIDE 20 MG PO TABS
20.0000 mg | ORAL_TABLET | Freq: Every day | ORAL | Status: DC
  Administered 2020-09-17: 20 mg via ORAL
  Filled 2020-09-16: qty 1

## 2020-09-16 MED ORDER — METHYLPREDNISOLONE SODIUM SUCC 125 MG IJ SOLR
80.0000 mg | Freq: Once | INTRAMUSCULAR | Status: AC
  Administered 2020-09-16: 80 mg via INTRAVENOUS
  Filled 2020-09-16: qty 2

## 2020-09-16 MED ORDER — DICYCLOMINE HCL 20 MG PO TABS
20.0000 mg | ORAL_TABLET | Freq: Three times a day (TID) | ORAL | Status: DC
  Administered 2020-09-17: 20 mg via ORAL
  Filled 2020-09-16: qty 1

## 2020-09-16 MED ORDER — SODIUM CHLORIDE 0.9 % IV SOLN
Freq: Once | INTRAVENOUS | Status: AC
  Filled 2020-09-16: qty 20

## 2020-09-16 MED ORDER — CARVEDILOL 25 MG PO TABS
25.0000 mg | ORAL_TABLET | Freq: Two times a day (BID) | ORAL | Status: DC
  Administered 2020-09-17 (×2): 25 mg via ORAL
  Filled 2020-09-16: qty 1
  Filled 2020-09-16 (×2): qty 2

## 2020-09-16 MED ORDER — SODIUM CHLORIDE 0.9 % IV SOLN
100.0000 mg | Freq: Every day | INTRAVENOUS | Status: DC
  Filled 2020-09-16: qty 20

## 2020-09-16 MED ORDER — ATORVASTATIN CALCIUM 10 MG PO TABS
20.0000 mg | ORAL_TABLET | Freq: Every day | ORAL | Status: DC
  Administered 2020-09-17 – 2020-09-21 (×5): 20 mg via ORAL
  Filled 2020-09-16 (×4): qty 2

## 2020-09-16 MED ORDER — FENTANYL CITRATE (PF) 100 MCG/2ML IJ SOLN
50.0000 ug | Freq: Once | INTRAMUSCULAR | Status: AC
Start: 2020-09-16 — End: 2020-09-16
  Administered 2020-09-16: 50 ug via INTRAVENOUS
  Filled 2020-09-16: qty 2

## 2020-09-16 MED ORDER — BUPROPION HCL ER (SR) 150 MG PO TB12
150.0000 mg | ORAL_TABLET | Freq: Two times a day (BID) | ORAL | Status: DC
  Administered 2020-09-16 – 2020-09-21 (×10): 150 mg via ORAL
  Filled 2020-09-16 (×11): qty 1

## 2020-09-16 MED ORDER — EPINEPHRINE 0.3 MG/0.3ML IJ SOAJ: 0.3000 mg | Freq: Once | INTRAMUSCULAR | Status: DC | PRN

## 2020-09-16 MED ORDER — ACETAMINOPHEN 325 MG PO TABS
650.0000 mg | ORAL_TABLET | Freq: Four times a day (QID) | ORAL | Status: DC | PRN
  Administered 2020-09-18 (×2): 650 mg via ORAL
  Filled 2020-09-16 (×2): qty 2

## 2020-09-16 MED ORDER — FAMOTIDINE IN NACL 20-0.9 MG/50ML-% IV SOLN
20.0000 mg | Freq: Once | INTRAVENOUS | Status: DC | PRN
  Filled 2020-09-16: qty 50

## 2020-09-16 MED ORDER — METHYLPREDNISOLONE SODIUM SUCC 125 MG IJ SOLR: 125.0000 mg | Freq: Once | INTRAMUSCULAR | Status: DC | PRN

## 2020-09-16 MED ORDER — AMLODIPINE BESYLATE 5 MG PO TABS
10.0000 mg | ORAL_TABLET | Freq: Every day | ORAL | Status: DC
  Administered 2020-09-17: 10 mg via ORAL
  Filled 2020-09-16: qty 2

## 2020-09-16 MED ORDER — SODIUM CHLORIDE 0.9 % IV SOLN: 1200.0000 mg | Freq: Once | INTRAVENOUS | Status: DC

## 2020-09-16 MED ORDER — PANTOPRAZOLE SODIUM 40 MG PO TBEC
40.0000 mg | DELAYED_RELEASE_TABLET | Freq: Two times a day (BID) | ORAL | Status: DC
  Administered 2020-09-16 – 2020-09-21 (×10): 40 mg via ORAL
  Filled 2020-09-16 (×10): qty 1

## 2020-09-16 MED ORDER — SERTRALINE HCL 100 MG PO TABS
100.0000 mg | ORAL_TABLET | Freq: Every day | ORAL | Status: DC
  Administered 2020-09-16 – 2020-09-20 (×5): 100 mg via ORAL
  Filled 2020-09-16 (×7): qty 1

## 2020-09-16 MED ORDER — LISINOPRIL 20 MG PO TABS
20.0000 mg | ORAL_TABLET | Freq: Every day | ORAL | Status: DC
  Administered 2020-09-17: 20 mg via ORAL
  Filled 2020-09-16: qty 1

## 2020-09-16 MED ORDER — HYDROXYZINE HCL 25 MG PO TABS
25.0000 mg | ORAL_TABLET | Freq: Three times a day (TID) | ORAL | Status: DC | PRN
  Administered 2020-09-17 – 2020-09-21 (×3): 25 mg via ORAL
  Filled 2020-09-16 (×3): qty 1

## 2020-09-16 MED ORDER — SODIUM CHLORIDE 0.9 % IV SOLN: INTRAVENOUS | Status: DC | PRN

## 2020-09-16 MED ORDER — ALBUTEROL SULFATE HFA 108 (90 BASE) MCG/ACT IN AERS: 6.0000 | INHALATION_SPRAY | Freq: Once | RESPIRATORY_TRACT | Status: AC

## 2020-09-16 MED ORDER — MELATONIN 3 MG PO TABS
3.0000 mg | ORAL_TABLET | Freq: Every evening | ORAL | Status: DC | PRN
  Administered 2020-09-18: 3 mg via ORAL
  Filled 2020-09-16 (×2): qty 1

## 2020-09-16 MED ORDER — GABAPENTIN 300 MG PO CAPS
600.0000 mg | ORAL_CAPSULE | Freq: Two times a day (BID) | ORAL | Status: DC
  Administered 2020-09-16: 600 mg via ORAL
  Filled 2020-09-16: qty 2

## 2020-09-16 MED ORDER — ALBUTEROL SULFATE (2.5 MG/3ML) 0.083% IN NEBU: 2.5000 mg | INHALATION_SOLUTION | Freq: Four times a day (QID) | RESPIRATORY_TRACT | Status: DC

## 2020-09-16 MED ORDER — ACETAMINOPHEN 325 MG PO TABS
650.0000 mg | ORAL_TABLET | Freq: Once | ORAL | Status: AC
  Administered 2020-09-16: 650 mg via ORAL
  Filled 2020-09-16: qty 2

## 2020-09-16 MED ORDER — INSULIN ASPART 100 UNIT/ML ~~LOC~~ SOLN
0.0000 [IU] | Freq: Three times a day (TID) | SUBCUTANEOUS | Status: DC
  Administered 2020-09-17: 5 [IU] via SUBCUTANEOUS
  Administered 2020-09-17 – 2020-09-21 (×3): 2 [IU] via SUBCUTANEOUS
  Administered 2020-09-21: 7 [IU] via SUBCUTANEOUS

## 2020-09-16 MED ORDER — ALBUTEROL SULFATE HFA 108 (90 BASE) MCG/ACT IN AERS
INHALATION_SPRAY | RESPIRATORY_TRACT | Status: AC
  Filled 2020-09-16: qty 6.7

## 2020-09-16 MED ORDER — OXYCODONE HCL 5 MG PO TABS
5.0000 mg | ORAL_TABLET | ORAL | Status: DC | PRN
  Administered 2020-09-16 – 2020-09-17 (×4): 5 mg via ORAL
  Filled 2020-09-16 (×4): qty 1

## 2020-09-16 MED ORDER — IPRATROPIUM BROMIDE HFA 17 MCG/ACT IN AERS
2.0000 | INHALATION_SPRAY | Freq: Once | RESPIRATORY_TRACT | Status: AC
  Administered 2020-09-16: 2 via RESPIRATORY_TRACT
  Filled 2020-09-16: qty 12.9

## 2020-09-16 MED ORDER — MONTELUKAST SODIUM 10 MG PO TABS
10.0000 mg | ORAL_TABLET | Freq: Every day | ORAL | Status: DC
  Administered 2020-09-16 – 2020-09-20 (×5): 10 mg via ORAL
  Filled 2020-09-16 (×6): qty 1

## 2020-09-16 MED ORDER — SUMATRIPTAN SUCCINATE 100 MG PO TABS: 100.0000 mg | ORAL_TABLET | Freq: Two times a day (BID) | ORAL | Status: DC | PRN

## 2020-09-16 MED ORDER — MOMETASONE FURO-FORMOTEROL FUM 200-5 MCG/ACT IN AERO
2.0000 | INHALATION_SPRAY | Freq: Two times a day (BID) | RESPIRATORY_TRACT | Status: DC
  Administered 2020-09-17 – 2020-09-21 (×8): 2 via RESPIRATORY_TRACT
  Filled 2020-09-16: qty 8.8

## 2020-09-16 MED ORDER — DIPHENHYDRAMINE HCL 50 MG/ML IJ SOLN: 50.0000 mg | Freq: Once | INTRAMUSCULAR | Status: DC | PRN

## 2020-09-16 MED ORDER — ACETAMINOPHEN 650 MG RE SUPP
650.0000 mg | Freq: Four times a day (QID) | RECTAL | Status: DC | PRN
Start: 2020-09-16 — End: 2020-09-18

## 2020-09-16 MED ORDER — SUCRALFATE 1 G PO TABS
1.0000 g | ORAL_TABLET | Freq: Three times a day (TID) | ORAL | Status: DC
  Administered 2020-09-16 – 2020-09-17 (×2): 1 g via ORAL
  Filled 2020-09-16 (×2): qty 1

## 2020-09-16 MED ORDER — OXYCODONE-ACETAMINOPHEN 10-325 MG PO TABS: 1.0000 | ORAL_TABLET | ORAL | Status: DC | PRN

## 2020-09-16 MED ORDER — TOPIRAMATE 100 MG PO TABS
100.0000 mg | ORAL_TABLET | Freq: Two times a day (BID) | ORAL | Status: DC
  Administered 2020-09-17 – 2020-09-20 (×6): 100 mg via ORAL
  Filled 2020-09-16 (×4): qty 1
  Filled 2020-09-16: qty 4
  Filled 2020-09-16: qty 1
  Filled 2020-09-16: qty 4
  Filled 2020-09-16 (×3): qty 1

## 2020-09-16 MED ORDER — SODIUM CHLORIDE 0.9 % IV SOLN
200.0000 mg | Freq: Once | INTRAVENOUS | Status: AC
  Administered 2020-09-16: 200 mg via INTRAVENOUS
  Filled 2020-09-16: qty 40

## 2020-09-16 MED ORDER — ENOXAPARIN SODIUM 40 MG/0.4ML ~~LOC~~ SOLN
40.0000 mg | SUBCUTANEOUS | Status: DC
  Administered 2020-09-17: 40 mg via SUBCUTANEOUS
  Filled 2020-09-16: qty 0.4

## 2020-09-16 MED ORDER — INSULIN DETEMIR 100 UNIT/ML ~~LOC~~ SOLN
37.0000 [IU] | Freq: Two times a day (BID) | SUBCUTANEOUS | Status: DC
  Administered 2020-09-16: 37 [IU] via SUBCUTANEOUS
  Filled 2020-09-16 (×3): qty 0.37

## 2020-09-16 MED ORDER — INSULIN DEGLUDEC 200 UNIT/ML ~~LOC~~ SOPN: 140.0000 [IU] | PEN_INJECTOR | Freq: Every day | SUBCUTANEOUS | Status: DC

## 2020-09-16 MED ORDER — OXYCODONE-ACETAMINOPHEN 5-325 MG PO TABS
1.0000 | ORAL_TABLET | ORAL | Status: DC | PRN
  Administered 2020-09-16 – 2020-09-17 (×3): 1 via ORAL
  Filled 2020-09-16 (×3): qty 1

## 2020-09-16 MED ORDER — MORPHINE SULFATE (PF) 4 MG/ML IV SOLN
4.0000 mg | Freq: Once | INTRAVENOUS | Status: DC
  Filled 2020-09-16: qty 1

## 2020-09-16 NOTE — TOC Initial Note (Signed)
Transition of Care [facility]) - Initial/Assessment Note    Patient Details  Name: [name] MRN: [id number] Date of Birth: 1970/12/10  Transition of Care [facility] CM/SW Contact:    Verdell Carmine, RN Phone Number: 09/16/2020, 3:51 PM  Clinical Narrative:                 Patient came to the Ed with shortness of breath fatigue. Patient has a trach, has constrictors in the past, asthma. She had gone out of town to a funeral in [place] last week. . Now presents with  A positive COVID test. She has had one vaccine. She is on humidified air via trach collar.at present. CM will follow for needs. She was offered and refused [facility] services two weeks ago.     Expected Discharge Plan: [place] Barriers to Discharge: Continued Medical Work up   Patient Goals and CMS Choice        Expected Discharge Plan and Services Expected Discharge Plan: [place]   Discharge Planning Services: CM Consult   Living arrangements for the past 2 months: Apartment                                      Prior Living Arrangements/Services Living arrangements for the past 2 months: Apartment Lives with:: Spouse Patient language and need for interpreter reviewed:: Yes        Need for Family Participation in Patient Care: Yes (Comment) Care giver support system in place?: Yes (comment)   Criminal Activity/Legal Involvement Pertinent to Current Situation/Hospitalization: No - Comment as needed  Activities of Daily Living      Permission Sought/Granted                  Emotional Assessment       Orientation: : Oriented to Self,Oriented to Place,Oriented to  Time,Oriented to Situation Alcohol / Substance Use: Not Applicable Psych Involvement: No (comment)  Admission diagnosis:  sHORTNESS OF BREATH, CHEST PAIN, BODY ACHES Patient Active Problem List   Diagnosis Date Noted  . Dyspnea 08/26/2020  . Rotator cuff tear 07/28/2019  . Diabetes  ([place]) 04/12/2019  . Acute asthma exacerbation 03/22/2019  . Chest pain of uncertain etiology 123456  . Lactic acidosis 03/22/2019  . Chronic right shoulder pain   . Community acquired pneumonia 10/25/2018  . Asthma, chronic, unspecified asthma severity, with acute exacerbation 10/25/2018  . Acute respiratory failure with hypoxia and hypercapnia ([place]) 10/23/2018  . Acute pain of right shoulder due to trauma 10/05/2018  . Chronic bilateral low back pain without sciatica 10/05/2018  . Dysphonia 10/05/2018  . Severe asthma with exacerbation 10/05/2018  . Rotator cuff arthropathy, right 06/15/2018  . AKI (acute kidney injury) ([place]) 03/06/2018  . Carpal tunnel syndrome 01/18/2018  . Normocytic anemia 05/06/2017  . Tracheostomy status ([place]) 04/28/2017  . Tracheostomy dependent ([place]) 03/26/2017  . Subglottic stenosis 03/06/2017  . Tracheal stenosis 03/04/2017  . Acute blood loss anemia   . Generalized anxiety disorder   . Morbid obesity ([place])   . Chronic pain syndrome   . Gastritis and gastroduodenitis 02/22/2016  . Depression 02/22/2016  . Migraine 01/30/2015  . Bulging lumbar disc 09/05/2013  . Essential hypertension 09/05/2013  . Allergic rhinitis, seasonal 08/11/2012  . Chronic cough 08/11/2012  . Sleep apnea, obstructive 12/04/2011   PCP:  [name], MD Pharmacy:   [facility] AT  [address], [place] [address] [place] [address] Phone: [phone] Fax: [phone]  [place]), [place] - [address] [place]) [place] [address] Phone: [phone] Fax: [phone]     Social Determinants of Health (SDOH) Interventions    Readmission Risk Interventions No flowsheet data found.

## 2020-09-16 NOTE — Progress Notes (Signed)
Pt s and trach checked. No issues with trach at this time.

## 2020-09-16 NOTE — ED Provider Notes (Signed)
[facility] EMERGENCY DEPARTMENT Provider Note   CSN: [id number] Arrival date & time: 09/16/20  0998     History Chief Complaint  Patient presents with  . Shortness of Breath    [name] is a 49 y.o. female with history of severe asthma, subglottic stenosis and tracheal stenosis s/p tracheostomy,diabetes, hypertension, chronic cough who presents with body aches and SOB. She states that she started to feel bad a couple days ago. She went to a funeral in [facility] and when she came back she started to feel sick. She reports body aches, SOB, wheezing, and difficulty eating and drinking due to difficulty swallowing.  She has had one dose of the COVID vaccine. She was hospitalized earlier this month for an asthma flare and blood coming out of her trach. She tested negative for COVID at that time. She also underwent fiberoptic bronchoscopy by ENT and it was felt the bleeding was coming from the subglottic area. She is followed by ENT at [facility]    HPI     Past Medical History:  Diagnosis Date  . Allergy   . Anxiety   . Arthritis   . Asthma   . Chronic back pain   . Chronic chest pain    resolved, no problems since 2019 per patient 07/27/19  . COPD (chronic obstructive pulmonary disease) ([place])   . Depression   . DM (diabetes mellitus) ([place])    INSULIN DEPENDENT - TYPE 1  . GERD (gastroesophageal reflux disease)   . Headache(784.0)   . Hyperlipidemia    no med, diet controlled  . Hypertension   . Hypokalemia   . Respiratory disease 05/2017   uses inhalers, neb tx prn, no oxygen  . Sleep apnea    does not use CPAP due to trach  . Tracheostomy in place [facility]) 02/2017    Patient Active Problem List   Diagnosis Date Noted  . Dyspnea 08/26/2020  . Rotator cuff tear 07/28/2019  . Diabetes ([place]) 04/12/2019  . Acute asthma exacerbation 03/22/2019  . Chest pain of uncertain etiology 33/82/5053  . Lactic acidosis 03/22/2019  . Chronic right  shoulder pain   . Community acquired pneumonia 10/25/2018  . Asthma, chronic, unspecified asthma severity, with acute exacerbation 10/25/2018  . Acute respiratory failure with hypoxia and hypercapnia ([place]) 10/23/2018  . Acute pain of right shoulder due to trauma 10/05/2018  . Chronic bilateral low back pain without sciatica 10/05/2018  . Dysphonia 10/05/2018  . Severe asthma with exacerbation 10/05/2018  . Rotator cuff arthropathy, right 06/15/2018  . AKI (acute kidney injury) ([place]) 03/06/2018  . Carpal tunnel syndrome 01/18/2018  . Normocytic anemia 05/06/2017  . Tracheostomy status ([place]) 04/28/2017  . Tracheostomy dependent ([place]) 03/26/2017  . Subglottic stenosis 03/06/2017  . Tracheal stenosis 03/04/2017  . Acute blood loss anemia   . Generalized anxiety disorder   . Morbid obesity ([place])   . Chronic pain syndrome   . Gastritis and gastroduodenitis 02/22/2016  . Depression 02/22/2016  . Migraine 01/30/2015  . Bulging lumbar disc 09/05/2013  . Essential hypertension 09/05/2013  . Allergic rhinitis, seasonal 08/11/2012  . Chronic cough 08/11/2012  . Sleep apnea, obstructive 12/04/2011    Past Surgical History:  Procedure Laterality Date  . ABDOMINAL HYSTERECTOMY  2009  . APPENDECTOMY    . CESAREAN SECTION     x 3  . CHOLECYSTECTOMY N/A 03/02/2014   Procedure: LAPAROSCOPIC CHOLECYSTECTOMY;  Surgeon: Joyice Faster. Cornett, MD;  Location: [place];  Service: General;  Laterality: N/A;  . HERNIA REPAIR    . PANENDOSCOPY N/A 03/04/2017   Procedure: PANENDOSCOPY WITH POSSIBLE FOREIGN BODY REMOVAL;  Surgeon: Jodi Marble, MD;  Location: [place];  Service: ENT;  Laterality: N/A;  . ROTATOR CUFF REPAIR Left   . ROTATOR CUFF REPAIR Right 07/27/2019  . SHOULDER ARTHROSCOPY WITH SUBACROMIAL DECOMPRESSION, ROTATOR CUFF REPAIR AND BICEP TENDON REPAIR Right 07/28/2019   Procedure: RIGHT SHOULDER ARTHROSCOP, MINI OPEN ROTATOR CUFF TEAR REPAIR,  BICEPS TENODESIS, DISTAL CLAVICLE EXCISION;  Surgeon:  Meredith Pel, MD;  Location: [place];  Service: Orthopedics;  Laterality: Right;  . TRACHEOSTOMY  02/2017  . VESICOVAGINAL FISTULA CLOSURE W/ TAH  2009     OB History    Gravida  4   Para  3   Term  3   Preterm      AB  1   Living  3     SAB      IAB      Ectopic  1   Multiple      Live Births  3           Family History  Problem Relation Age of Onset  . Heart attack Mother   . Stroke Mother   . Diabetes Mother   . Hypertension Mother   . Arthritis Mother   . Stroke Father   . Hypertension Sister   . Diabetes Sister   . Seizures Brother   . Diabetes Brother   . Colon cancer Neg Hx   . Rectal cancer Neg Hx   . Stomach cancer Neg Hx     Social History   Tobacco Use  . Smoking status: Former Smoker    Packs/day: 0.25    Years: 22.00    Pack years: 5.50    Types: Cigarettes    Quit date: 01/20/2017    Years since quitting: 3.6  . Smokeless tobacco: Never Used  Vaping Use  . Vaping Use: Never used  Substance Use Topics  . Alcohol use: Yes    Alcohol/week: 0.0 standard drinks    Comment: social wine  . Drug use: Yes    Types: Marijuana    Comment: Last Use In July 2021    Home Medications Prior to Admission medications   Medication Sig Start Date End Date Taking? Authorizing Provider  acetaminophen (TYLENOL) 325 MG tablet Take 1-2 tablets (325-650 mg total) by mouth every 6 (six) hours as needed for mild pain (pain score 1-3 or temp > 100.5). 03/15/20   [name], MD  albuterol (PROAIR HFA) 108 (90 Base) MCG/ACT inhaler Inhale 2 puffs into the lungs every 6 (six) hours as needed for wheezing or shortness of breath. 07/11/20   [name], MD  albuterol (PROVENTIL) (2.5 MG/3ML) 0.083% nebulizer solution Take 3 mLs (2.5 mg total) by nebulization every 6 (six) hours as needed for wheezing or shortness of breath. 06/18/20   [name], MD  amLODipine (NORVASC) 10 MG tablet Take 1 tablet (10 mg total) by mouth daily. 07/11/20    [name], MD  atorvastatin (LIPITOR) 20 MG tablet Take 1 tablet (20 mg total) by mouth daily. 07/11/20   [name], MD  Blood Glucose Monitoring Suppl (ONETOUCH VERIO) w/Device KIT 1 each by Does not apply route daily. Use to monitor glucose levels daily; E11.9 04/14/19   Renato Shin, MD  buPROPion [facility] SR) 150 MG 12 hr tablet Take 1 tablet (150 mg total) by mouth 2 (two) times daily. 07/11/20  [name], MD  carvedilol (COREG) 25 MG tablet Take 1 tablet (25 mg total) by mouth 2 (two) times daily with a meal. 07/11/20   [name], MD  clotrimazole-betamethasone (LOTRISONE) cream Apply 1 application topically 3 (three) times daily as needed. For rash 03/20/20   Renato Shin, MD  dicyclomine (BENTYL) 20 MG tablet Take 1 tablet (20 mg total) by mouth 3 (three) times daily before meals. 08/10/20   Orpah Greek, MD  furosemide (LASIX) 20 MG tablet Take 1 tablet (20 mg total) by mouth daily. 03/31/19   [name], MD  gabapentin (NEURONTIN) 300 MG capsule Take 2 capsules (600 mg total) by mouth 2 (two) times daily. 10/29/18   Domenic Polite, MD  glucose blood [facility] VERIO) test strip Use to monitor glucose levels daily; E11.9 09/14/19   Renato Shin, MD  hydrOXYzine (ATARAX/VISTARIL) 25 MG tablet Take 1 tablet (25 mg total) by mouth 3 (three) times daily as needed for anxiety. 07/11/20   [name], MD  Insulin Degludec (TRESIBA FLEXTOUCH) 200 UNIT/ML SOPN Inject 140 Units into the skin daily. 07/11/19   Renato Shin, MD  Insulin Pen Needle (FIFTY50 PEN NEEDLES) 31G X 8 MM MISC Inject 1 Units as directed daily. 07/30/18   [provider]  lisinopril (ZESTRIL) 20 MG tablet Take 1 tablet (20 mg total) by mouth daily. 07/11/20   [name], MD  Melatonin 3 MG TABS Take 3 mg by mouth at bedtime as needed (for sleep).  10/31/17   [provider]  Misc. Devices MISC Nebulizer device. Diagnosis - Asthma 07/16/20   [name], MD   mometasone-formoterol (DULERA) 200-5 MCG/ACT AERO Inhale 2 puffs into the lungs in the morning and at bedtime. 07/11/20   [name], MD  montelukast (SINGULAIR) 10 MG tablet Take 1 tablet (10 mg total) by mouth at bedtime. 07/11/20   [name], MD  OneTouch Delica Lancets 57D MISC 1 each by Does not apply route daily. Use to monitor glucose levels daily; E11.9 04/14/19   Renato Shin, MD  oxyCODONE-acetaminophen (PERCOCET) 10-325 MG tablet Take 1 tablet by mouth 4 (four) times daily as needed for pain.  05/02/20   [provider]  pantoprazole (PROTONIX) 40 MG tablet Take 1 tablet (40 mg total) by mouth 2 (two) times daily. 08/13/20   [name], MD  Semaglutide, 1 MG/DOSE, (OZEMPIC, 1 MG/DOSE,) 2 MG/1.5ML SOPN Inject 1 mg into the skin once a week. 12/19/19   Renato Shin, MD  sertraline (ZOLOFT) 100 MG tablet Take 1 tablet (100 mg total) by mouth at bedtime. 07/11/20   [name], MD  sucralfate (CARAFATE) 1 g tablet Take 1 tablet (1 g total) by mouth 4 (four) times daily -  with meals and at bedtime. 08/10/20   Orpah Greek, MD  SUMAtriptan (IMITREX) 100 MG tablet At the onset of a migraine; may repeat in 2 hours. Max daily dose 266m Patient taking differently: Take 100 mg by mouth every 2 (two) hours as needed for migraine.  07/11/20   NCharlott Rakes MD  topiramate (TOPAMAX) 100 MG tablet Take 1 tablet (100 mg total) by mouth 2 (two) times daily. 07/11/20   NCharlott Rakes MD  fluticasone-salmeterol (ADVAIR HFA) 230-21 MCG/ACT inhaler Inhale 2 puffs into the lungs 2 (two) times daily. 10/15/17 10/15/17  NCharlott Rakes MD    Allergies    Reglan [metoclopramide]  Review of Systems   Review of Systems  Constitutional: Positive for fever.  Respiratory: Positive for cough, shortness of  breath and wheezing.   Cardiovascular: Negative for chest pain.  Gastrointestinal: Negative for abdominal pain.  Musculoskeletal: Positive for myalgias.  All other  systems reviewed and are negative.   Physical Exam Updated Vital Signs BP 106/72 (BP Location: Right Arm)   Pulse 95   Temp 98.3 F (36.8 C) (Oral)   Resp 19   LMP  (LMP Unknown)   SpO2 100%   Physical Exam Vitals and nursing note reviewed.  Constitutional:      General: She is not in acute distress.    Appearance: She is well-developed and well-nourished. She is obese. She is ill-appearing.  HENT:     Head: Normocephalic and atraumatic.  Eyes:     General: No scleral icterus.       Right eye: No discharge.        Left eye: No discharge.     Conjunctiva/sclera: Conjunctivae normal.     Pupils: Pupils are equal, round, and reactive to light.  Cardiovascular:     Rate and Rhythm: Normal rate and regular rhythm.  Pulmonary:     Effort: Pulmonary effort is normal. No respiratory distress.     Breath sounds: Wheezing (diffuse inspiratory and expiratory wheezes) present.  Abdominal:     General: There is no distension.  Musculoskeletal:     Cervical back: Normal range of motion.     Right lower leg: No edema.     Left lower leg: No edema.  Skin:    General: Skin is warm and dry.  Neurological:     Mental Status: She is alert and oriented to person, place, and time.  Psychiatric:        Mood and Affect: Mood is anxious.        Behavior: Behavior normal.     ED Results / Procedures / Treatments   Labs (all labs ordered are listed, but only abnormal results are displayed) Labs Reviewed  BASIC METABOLIC PANEL - Abnormal; Notable for the following components:      Result Value   CO2 20 (*)    Glucose, Bld 169 (*)    Creatinine, Ser 1.19 (*)    GFR, Estimated 56 (*)    All other components within normal limits  CBC - Abnormal; Notable for the following components:   Hemoglobin 11.7 (*)    All other components within normal limits  RESP PANEL BY RT-PCR (FLU A&B, COVID) ARPGX2    EKG EKG Interpretation  Date/Time:  Sunday September 16 2020 08:58:02 EST Ventricular  Rate:  117 PR Interval:  140 QRS Duration: 76 QT Interval:  332 QTC Calculation: 463 R Axis:   18 Text Interpretation: Sinus tachycardia Otherwise normal ECG Sinus tachycardia, unchanged from previous Confirmed by Lavenia Atlas [phone]) on 09/16/2020 1:15:43 PM   Radiology DG Chest 2 View  Result Date: 09/16/2020 CLINICAL DATA:  Cough and chest pain. EXAM: CHEST - 2 VIEW COMPARISON:  08/26/2020 FINDINGS: Trace airspace at the bases likely atelectatic although superimposed infection not excluded. No edema, pneumothorax, or pleural effusion. The cardiopericardial silhouette is within normal limits for size. Tracheostomy tube again noted. IMPRESSION: Probable basilar atelectasis. Otherwise no acute cardiopulmonary findings. Electronically Signed   By: Misty Stanley M.D.   On: 09/16/2020 09:40    Procedures Procedures (including critical care time)  Medications Ordered in ED Medications  albuterol (VENTOLIN HFA) 108 (90 Base) MCG/ACT inhaler 6 puff (has no administration in time range)  ipratropium (ATROVENT HFA) inhaler 2 puff (has no administration in time  range)  methylPREDNISolone sodium succinate (SOLU-MEDROL) 125 mg/2 mL injection 80 mg (has no administration in time range)  magnesium sulfate IVPB 2 g 50 mL (has no administration in time range)  albuterol (VENTOLIN HFA) 108 (90 Base) MCG/ACT inhaler (  Given 09/16/20 6940)    ED Course  I have reviewed the triage vital signs and the nursing notes.  Pertinent labs & imaging results that were available during my care of the patient were reviewed by me and considered in my medical decision making (see chart for details).  49 year old female presents with SOB, fever, body aches for a couple days since travel out of state. She is febrile on arrival but not hypoxic. RT has seen patient and did not feel there was any problem with the trach. She was given albuterol and started on humidified air. EKG was obtained and shows sinus  tachycardia. She is short of breath on exam and diffusely wheezing. CXR is negative. Labs obtained are normal. Will add on COVID test and treat for asthma exacerbation.  At shift change COVID is pending. Would consider MAB if positive. If negative it is possible she can be discharged if she is feeling better. Care signed out to S Joy PA-C  [place] was evaluated in Emergency Department on 09/16/2020 for the symptoms described in the history of present illness. She was evaluated in the context of the global COVID-19 pandemic, which necessitated consideration that the patient might be at risk for infection with the SARS-CoV-2 virus that causes COVID-19. Institutional protocols and algorithms that pertain to the evaluation of patients at risk for COVID-19 are in a state of rapid change based on information released by regulatory bodies including the CDC and federal and state organizations. These policies and algorithms were followed during the patient's care in the ED.   MDM Rules/Calculators/A&P                           Final Clinical Impression(s) / ED Diagnoses Final diagnoses:  Moderate persistent asthma with exacerbation    Rx / DC Orders ED Discharge Orders    None       Recardo Evangelist, PA-C 09/16/20 1508    Quintella Reichert, MD 09/17/20 [phone]

## 2020-09-16 NOTE — ED Triage Notes (Signed)
Patient arrived complaining of SOB and states that her trach is plugged. Patient alert and trying to cough up mucus. RT called for evaluation of same at triage

## 2020-09-16 NOTE — ED Provider Notes (Signed)
[name] is a 49 y.o. female, presenting to the ED with cough and shortness of breath.  HPI from Janetta Hora, PA-C: "[name] is a 49 y.o. female with history of severe asthma, subglottic stenosis and tracheal stenosis s/p tracheostomy,diabetes, hypertension, chronic cough who presents with body aches and SOB. She states that she started to feel bad a couple days ago. She went to a funeral in [facility] and when she came back she started to feel sick. She reports body aches, SOB, wheezing, and difficulty eating and drinking due to difficulty swallowing.  She has had one dose of the COVID vaccine. She was hospitalized earlier this month for an asthma flare and blood coming out of her trach. She tested negative for COVID at that time. She also underwent fiberoptic bronchoscopy by ENT and it was felt the bleeding was coming from the subglottic area. She is followed by ENT at [facility]."  Past Medical History:  Diagnosis Date  . Allergy   . Anxiety   . Arthritis   . Asthma   . Chronic back pain   . Chronic chest pain    resolved, no problems since 2019 per patient 07/27/19  . COPD (chronic obstructive pulmonary disease) ([place])   . Depression   . DM (diabetes mellitus) ([place])    INSULIN DEPENDENT - TYPE 1  . GERD (gastroesophageal reflux disease)   . Headache(784.0)   . Hyperlipidemia    no med, diet controlled  . Hypertension   . Hypokalemia   . Respiratory disease 05/2017   uses inhalers, neb tx prn, no oxygen  . Sleep apnea    does not use CPAP due to trach  . Tracheostomy in place [facility]) 02/2017    Physical Exam  BP 106/72 (BP Location: Right Arm)   Pulse 95   Temp 98.3 F (36.8 C) (Oral)   Resp 19   LMP  (LMP Unknown)   SpO2 100%   Physical Exam Vitals and nursing note reviewed.  Constitutional:      General: She is not in acute distress.    Appearance: She is well-developed. She is obese. She is not diaphoretic.  HENT:     Head:  Normocephalic and atraumatic.     Mouth/Throat:     Mouth: Mucous membranes are moist.     Pharynx: Oropharynx is clear.  Eyes:     Conjunctiva/sclera: Conjunctivae normal.  Neck:     Comments: Patient able to self clear her trach. Cardiovascular:     Rate and Rhythm: Normal rate and regular rhythm.     Pulses: Normal pulses.          Radial pulses are 2+ on the right side and 2+ on the left side.       Posterior tibial pulses are 2+ on the right side and 2+ on the left side.     Heart sounds: Normal heart sounds.     Comments: Tactile temperature in the extremities appropriate and equal bilaterally. Pulmonary:     Breath sounds: Rhonchi present.     Comments: Tachypnea and increased work of breathing, especially with even mild exertion. Abdominal:     Palpations: Abdomen is soft.     Tenderness: There is no abdominal tenderness. There is no guarding.  Musculoskeletal:     Cervical back: Neck supple.     Right lower leg: No edema.     Left lower leg: No edema.  Skin:    General: Skin is warm and  dry.  Neurological:     Mental Status: She is alert.  Psychiatric:        Mood and Affect: Mood and affect normal.        Speech: Speech normal.        Behavior: Behavior normal.     ED Course/Procedures     Procedures   Abnormal Labs Reviewed  RESP PANEL BY RT-PCR (FLU A&B, COVID) ARPGX2 - Abnormal; Notable for the following components:      Result Value   SARS Coronavirus 2 by RT PCR POSITIVE (*)    All other components within normal limits  BASIC METABOLIC PANEL - Abnormal; Notable for the following components:   CO2 20 (*)    Glucose, Bld 169 (*)    Creatinine, Ser 1.19 (*)    GFR, Estimated 56 (*)    All other components within normal limits  CBC - Abnormal; Notable for the following components:   Hemoglobin 11.7 (*)    All other components within normal limits    DG Chest 2 View  Result Date: 09/16/2020 CLINICAL DATA:  Cough and chest pain. EXAM: CHEST - 2  VIEW COMPARISON:  08/26/2020 FINDINGS: Trace airspace at the bases likely atelectatic although superimposed infection not excluded. No edema, pneumothorax, or pleural effusion. The cardiopericardial silhouette is within normal limits for size. Tracheostomy tube again noted. IMPRESSION: Probable basilar atelectasis. Otherwise no acute cardiopulmonary findings. Electronically Signed   By: Misty Stanley M.D.   On: 09/16/2020 09:40   CT Angio Chest PE W and/or Wo Contrast  Result Date: 08/26/2020 CLINICAL DATA:  Short of breath. Blood streaked sputum. Centralized chest pain for 1 week. EXAM: CT ANGIOGRAPHY CHEST WITH CONTRAST TECHNIQUE: Multidetector CT imaging of the chest was performed using the standard protocol during bolus administration of intravenous contrast. Multiplanar CT image reconstructions and MIPs were obtained to evaluate the vascular anatomy. CONTRAST:  19mL OMNIPAQUE IOHEXOL 350 MG/ML SOLN COMPARISON:  03/22/2019.  Current chest radiograph. FINDINGS: Cardiovascular: Pulmonary arteries are well opacified. There is no evidence of a pulmonary embolism. Heart is normal in size and configuration. No pericardial effusion. No coronary artery calcifications. Normal great vessels. Mediastinum/Nodes: Well-positioned tracheostomy tube stable from the prior exam. No neck base, axillary, mediastinal or hilar masses or enlarged lymph nodes. Trachea and esophagus are unremarkable. Lungs/Pleura: Lungs are clear. No pleural effusion or pneumothorax. Upper Abdomen: Fatty infiltration of the liver.  No acute findings. Musculoskeletal: No fracture or acute finding. No bone lesion. No chest wall masses. Review of the MIP images confirms the above findings. IMPRESSION: 1. No evidence of a pulmonary embolism. 2. No acute findings.  Clear lungs. Electronically Signed   By: Lajean Manes M.D.   On: 08/26/2020 18:55   DG Chest Portable 1 View  Result Date: 08/26/2020 CLINICAL DATA:  Chest pain and shortness of breath.  Bloody sputum coming from tracheostomy. EXAM: PORTABLE CHEST 1 VIEW COMPARISON:  March 25, 2019 FINDINGS: The tracheostomy tube is stable. The cardiomediastinal silhouette is unchanged. No pneumothorax. No nodules or masses. No focal infiltrates. IMPRESSION: No active disease. Electronically Signed   By: Dorise Bullion III M.D   On: 08/26/2020 17:12     EKG Interpretation  Date/Time:  Sunday September 16 2020 08:58:02 EST Ventricular Rate:  117 PR Interval:  140 QRS Duration: 76 QT Interval:  332 QTC Calculation: 463 R Axis:   18 Text Interpretation: Sinus tachycardia Otherwise normal ECG Sinus tachycardia, unchanged from previous Confirmed by Lavenia Atlas [phone]) on 09/16/2020  1:15:43 PM       MDM   Clinical Course as of 09/16/20 2026  Wynelle Link Sep 16, 2020  1746 Spoke with hospitalist. He states it looks like patient's last admission was via family medicine service. [[facility]]  1929 Spoke with Dr. Dareen Piano, family medicine resident.  Agrees to meet the patient. [[facility]]    Clinical Course User Index [[facility]] Anselm Pancoast, PA-C   Patient care handoff report received from Terance Hart, PA-C. Plan: Reevaluate patient.  Admission versus discharge.   Patient with dyspnea, wheezing, rhonchi.  She is Covid positive. I personally reviewed and interpreted the patient's labs and imaging studies.  No evidence of pneumonia or pulmonary edema on chest x-ray. Patient became quite dyspneic with ambulation.  Although she did not become hypoxic, I do not think it would be advisable to send the patient home given her work of breathing with even mild exertion.        Concepcion Living 09/16/20 2026    Arby Barrette, MD 09/17/20 1626

## 2020-09-16 NOTE — H&P (Addendum)
[facility] Admission History and Physical Service Pager: [phone]  Patient name: [name] Medical record number: [id number] Date of birth: 06-Dec-1970 Age: 49 y.o. Gender: female  Primary Care Provider: [name], MD Consultants: None Code Status: Full Preferred Emergency Contact: Husband [name] [phone]  Chief Complaint:  Assessment and Plan: [name] is a 49 y.o. female presenting with SOB . PMH is significant for  severe asthma, subglottic stenosis and tracheal stenosis s/p tracheostomy (current), diabetes, hypertension, chronic cough, OSA, h/o normocytic anemia, h/o gastritis and gastroduodenitis, generalized anxiety disorder, depression, morbid obesity, chronic pain syndrome, chronic bilateral low back pain, carpal tunnel syndrome, dysphonia.  SOB I Asthma Exacerbation I Covid Positive Pt presented with SOB that started couple of days ago. She went to [facility] recently for a funeral and when she came back she started feeling sick with cough and fever. In the ED, Pt reported that her trachea was plugged. [name] was checked in the ED by RT and no issues were found. She was found febrile with Temp of 100.8 F, tachycardic with PR 115 and tachypnic with RR 22. She was breathing at 95% at room air. On examination, diffuse wheezing present b/l, Mild crackles present which sounds more like upper airway sounds. CXR reassuring showed probable basilar atelectasis and negative for pnumonia. She was found to be COVID positive. Pt was recently admitted in early Dec for Asthma exacerbation.  Pt had 1 dose of  COVID vaccine August 29, 2020. Patient was placed on airborne and contact precautions. She was given 1 dose of Solu-Medrol and started on ipratropium in the ED. Mg was a given. Patient was also given remdesivir and MABs in the ED. Her symptoms are most likely due to asthma exacerbation worsened by COVID. Patient also has a  history of anxiety which could be contributing to her increased work of breathing. Low concerns for Pneumonia as CXR negative and WBC count also wnl. We'll keep on monitoring and obtain Am CBC with Diff. Can consider repeating CXR as needed. No foreign body appreciated on X ray. Will not continue Remdesivir and Solumedrol as Pt is sating well and doesn't need O2 at the current moment.  -Admit to FPTS, unit-progressive, Attending Dr. Owens Shark. -0.9% NaCl at 0-10 ml/hr as needed for IV meds. -Tylenol 650 mg for Fever, pain as needed Q6H -Vitals per routine.  -Continuous pulse ox. -Continue home asthma medications. -Contact and airborne precautions. -Stop remdesivir and solu-medrol as patient is not requiring oxygen at this moment. -Am CBC -AM BMP -PT/OT eval and Treat -Up with Assistance -Maintain O2 sat >92%  Asthma Patient home medication include albuterol inhaler 2 puffs every 6 hours as needed, albuterol nebulizer every 6 hours as needed, Dulera 2 puffs twice daily, Singulair 10 mg tablets nightly.  Tracheostomy is managed by ENT at [place] Albuterol 2puffs q6 PRN shortness of breath -Dulera 2 puffs BID -Follow up whether or not patient has been seen by [facility] for trach care and when she needs the trach to be changed  Diabetes Type I vs II, well controlled Blood Glucose at admission was 169.  Home meds include Tresiba 140 units and Ozempic 1 mg once weekly. Pharmacy consulted and advised starting levimir. -Start Levimir 37 units BID as Zeb Comfort is not available in Inpatient. Titrate as needed -Sensitive sliding scale insulin -CBGs every morning and before every meal -Carb-modified diet  HTN BP on admission systolic 94-8 53 and diastolic 54-62. Pulse ran from 85-115/min.  Home meds include amlodipine 10 mg daily, Coreg 25 mg twice daily, lisinopril 20 mg daily, Lasix 20 mg daily.  No signs of fluid overload on exam.  No extremity edema. -Continue home HTN  medications.  History of normocytic anemia  h/o gastritis and gastroduodenitis Hemoglobin at admission was 11.7 with MCV of 87.7.  It is stable since last admission in early December.  Patient home medication include Bentyl 20 mg 3 times daily before meals, Protonix 40 mg twice daily, Carafate 1 g 3 times daily before meals and at bedtime. -Transfusion threshold Hgb <7  OSA Patient has not worn CPAP since trach placement in 2018. -Can offer trach appropriate CPAP if requested.  Generalized anxiety disorder  Depression Patient home medication includes bupropion 150 mg 12-hour tablet twice daily, Zoloft 100 mg at bedtime, hydroxyzine 25 mg tid as needed and melatonin 3 mg at bedtime. -Continue home medications as above.  Chronic pain syndrome  chronic bilateral low back pain  carpal tunnel syndrome Patient takes Percocet 10-325 mg 4 times daily as needed, Neurontin 600 mg twice daily -Continue home medication as needed for pain. -Plan to decrease pain medications if concern for oversedation.  Hyperlipidemia Home medication include atorvastatin 20 mg daily. -Continue atorvastatin.  Migraines Patient home medication include sumatriptan 100 mg as needed for migraine.  She takes it at the onset of migraine and repeat in 2 hours.  She also takes topiramate 100 mg twice daily. -Continue topiramate.  FEN/GI: Carb Modified Diet. Prophylaxis: Enoxaparin  Disposition: Progressive  History of Present Illness:  [name] is a 49 y.o. female presenting with shortness of breath. Pt states she started coughing, felt bad, on wednesday. She took  Temp on Thursday  and she had a fever of 101*F when she was in [facility], Friday it was 102, Saturday 99.9-100.8. Yesterday she felt bad. She took robitussin for cough, tylenol for fever, oxycodone for chronic pains/acute pains. She states she was coughing up green mucus and some blood this am. She was fine before [place], went to [facility] on Saturday of  last week. Her sister was recently sick, said she had the flu. Her brother was also sick in [place]. Brother is not vaccinated against COVID, sister she rode down with is vaccinated against [place], her other sister is vaccinated. She also c/o Mild diarrhea, chest pain, abdominal pain with cough, whole body aches Denies any constipation.  Review Of Systems: Per HPI with the following additions:  Review of Systems  Constitutional: Positive for fever.  Respiratory: Positive for cough and wheezing.   Cardiovascular: Positive for chest pain.  Gastrointestinal: Positive for abdominal pain and diarrhea. Negative for nausea and vomiting.  Musculoskeletal: Positive for back pain and myalgias.  Neurological: Negative for dizziness.    Patient Active Problem List   Diagnosis Date Noted  . Dyspnea 08/26/2020  . Rotator cuff tear 07/28/2019  . Diabetes ([place]) 04/12/2019  . Acute asthma exacerbation 03/22/2019  . Chest pain of uncertain etiology 71/69/6789  . Lactic acidosis 03/22/2019  . Chronic right shoulder pain   . Community acquired pneumonia 10/25/2018  . Asthma, chronic, unspecified asthma severity, with acute exacerbation 10/25/2018  . Acute respiratory failure with hypoxia and hypercapnia ([place]) 10/23/2018  . Acute pain of right shoulder due to trauma 10/05/2018  . Chronic bilateral low back pain without sciatica 10/05/2018  . Dysphonia 10/05/2018  . Severe asthma with exacerbation 10/05/2018  . Rotator cuff arthropathy, right 06/15/2018  . AKI (acute kidney injury) ([place]) 03/06/2018  .  Carpal tunnel syndrome 01/18/2018  . Normocytic anemia 05/06/2017  . Tracheostomy status ([place]) 04/28/2017  . Tracheostomy dependent ([place]) 03/26/2017  . Subglottic stenosis 03/06/2017  . Tracheal stenosis 03/04/2017  . Acute blood loss anemia   . Generalized anxiety disorder   . Morbid obesity ([place])   . Chronic pain syndrome   . Gastritis and gastroduodenitis 02/22/2016  . Depression 02/22/2016  .  Migraine 01/30/2015  . Bulging lumbar disc 09/05/2013  . Essential hypertension 09/05/2013  . Allergic rhinitis, seasonal 08/11/2012  . Chronic cough 08/11/2012  . Sleep apnea, obstructive 12/04/2011    Past Medical History: Past Medical History:  Diagnosis Date  . Allergy   . Anxiety   . Arthritis   . Asthma   . Chronic back pain   . Chronic chest pain    resolved, no problems since 2019 per patient 07/27/19  . COPD (chronic obstructive pulmonary disease) ([place])   . Depression   . DM (diabetes mellitus) ([place])    INSULIN DEPENDENT - TYPE 1  . GERD (gastroesophageal reflux disease)   . Headache(784.0)   . Hyperlipidemia    no med, diet controlled  . Hypertension   . Hypokalemia   . Respiratory disease 05/2017   uses inhalers, neb tx prn, no oxygen  . Sleep apnea    does not use CPAP due to trach  . Tracheostomy in place [facility]) 02/2017    Past Surgical History: Past Surgical History:  Procedure Laterality Date  . ABDOMINAL HYSTERECTOMY  2009  . APPENDECTOMY    . CESAREAN SECTION     x 3  . CHOLECYSTECTOMY N/A 03/02/2014   Procedure: LAPAROSCOPIC CHOLECYSTECTOMY;  Surgeon: Joyice Faster. [place], MD;  Location: [place];  Service: General;  Laterality: N/A;  . HERNIA REPAIR    . PANENDOSCOPY N/A 03/04/2017   Procedure: PANENDOSCOPY WITH POSSIBLE FOREIGN BODY REMOVAL;  Surgeon: Jodi Marble, MD;  Location: [place];  Service: ENT;  Laterality: N/A;  . ROTATOR CUFF REPAIR Left   . ROTATOR CUFF REPAIR Right 07/27/2019  . SHOULDER ARTHROSCOPY WITH SUBACROMIAL DECOMPRESSION, ROTATOR CUFF REPAIR AND BICEP TENDON REPAIR Right 07/28/2019   Procedure: RIGHT SHOULDER ARTHROSCOP, MINI OPEN ROTATOR CUFF TEAR REPAIR,  BICEPS TENODESIS, DISTAL CLAVICLE EXCISION;  Surgeon: Meredith Pel, MD;  Location: [place];  Service: Orthopedics;  Laterality: Right;  . TRACHEOSTOMY  02/2017  . VESICOVAGINAL FISTULA CLOSURE W/ TAH  2009    Social History: Social History   Tobacco Use  . Smoking  status: Former Smoker    Packs/day: 0.25    Years: 22.00    Pack years: 5.50    Types: Cigarettes    Quit date: 01/20/2017    Years since quitting: 3.6  . Smokeless tobacco: Never Used  Vaping Use  . Vaping Use: Never used  Substance Use Topics  . Alcohol use: Yes    Alcohol/week: 0.0 standard drinks    Comment: social wine  . Drug use: Yes    Types: Marijuana    Comment: Last Use In July 2021   Additional social history:   Family History: Family History  Problem Relation Age of Onset  . Heart attack Mother   . Stroke Mother   . Diabetes Mother   . Hypertension Mother   . Arthritis Mother   . Stroke Father   . Hypertension Sister   . Diabetes Sister   . Seizures Brother   . Diabetes Brother   . Colon cancer Neg Hx   . Rectal cancer  Neg Hx   . Stomach cancer Neg Hx      Allergies and Medications: Allergies  Allergen Reactions  . Reglan [Metoclopramide] Other (See Comments)    Panic attack   Current Facility-Administered Medications on File Prior to Encounter  Medication Dose Route Frequency Provider Last Rate Last Admin  . 0.9 %  sodium chloride infusion  500 mL Intravenous Once Irene Shipper, MD       Current Outpatient Medications on File Prior to Encounter  Medication Sig Dispense Refill  . acetaminophen (TYLENOL) 325 MG tablet Take 1-2 tablets (325-650 mg total) by mouth every 6 (six) hours as needed for mild pain (pain score 1-3 or temp > 100.5). 60 tablet 0  . albuterol (PROAIR HFA) 108 (90 Base) MCG/ACT inhaler Inhale 2 puffs into the lungs every 6 (six) hours as needed for wheezing or shortness of breath. 18 g 6  . albuterol (PROVENTIL) (2.5 MG/3ML) 0.083% nebulizer solution Take 3 mLs (2.5 mg total) by nebulization every 6 (six) hours as needed for wheezing or shortness of breath. 75 mL 3  . amLODipine (NORVASC) 10 MG tablet Take 1 tablet (10 mg total) by mouth daily. 90 tablet 1  . atorvastatin (LIPITOR) 20 MG tablet Take 1 tablet (20 mg total) by mouth  daily. 90 tablet 1  . Blood Glucose Monitoring Suppl (ONETOUCH VERIO) w/Device KIT 1 each by Does not apply route daily. Use to monitor glucose levels daily; E11.9 1 kit 0  . buPROPion (WELLBUTRIN SR) 150 MG 12 hr tablet Take 1 tablet (150 mg total) by mouth 2 (two) times daily. 180 tablet 1  . carvedilol (COREG) 25 MG tablet Take 1 tablet (25 mg total) by mouth 2 (two) times daily with a meal. 180 tablet 1  . clotrimazole-betamethasone (LOTRISONE) cream Apply 1 application topically 3 (three) times daily as needed. For rash 45 g 2  . dicyclomine (BENTYL) 20 MG tablet Take 1 tablet (20 mg total) by mouth 3 (three) times daily before meals. 30 tablet 0  . furosemide (LASIX) 20 MG tablet Take 1 tablet (20 mg total) by mouth daily. 30 tablet 2  . gabapentin (NEURONTIN) 300 MG capsule Take 2 capsules (600 mg total) by mouth 2 (two) times daily. 360 capsule 1  . glucose blood (ONETOUCH VERIO) test strip Use to monitor glucose levels daily; E11.9 100 each 2  . hydrOXYzine (ATARAX/VISTARIL) 25 MG tablet Take 1 tablet (25 mg total) by mouth 3 (three) times daily as needed for anxiety. 180 tablet 1  . Insulin Degludec (TRESIBA FLEXTOUCH) 200 UNIT/ML SOPN Inject 140 Units into the skin daily. 12 pen 11  . Insulin Pen Needle (FIFTY50 PEN NEEDLES) 31G X 8 MM MISC Inject 1 Units as directed daily.    Marland Kitchen lisinopril (ZESTRIL) 20 MG tablet Take 1 tablet (20 mg total) by mouth daily. 90 tablet 1  . Melatonin 3 MG TABS Take 3 mg by mouth at bedtime as needed (for sleep).     . Misc. Devices MISC Nebulizer device. Diagnosis - Asthma 1 each 0  . mometasone-formoterol (DULERA) 200-5 MCG/ACT AERO Inhale 2 puffs into the lungs in the morning and at bedtime. 1 each 6  . montelukast (SINGULAIR) 10 MG tablet Take 1 tablet (10 mg total) by mouth at bedtime. 90 tablet 1  . OneTouch Delica Lancets 40C MISC 1 each by Does not apply route daily. Use to monitor glucose levels daily; E11.9 100 each 2  . oxyCODONE-acetaminophen  (PERCOCET) 10-325 MG tablet Take  1 tablet by mouth 4 (four) times daily as needed for pain.     . pantoprazole (PROTONIX) 40 MG tablet Take 1 tablet (40 mg total) by mouth 2 (two) times daily. 60 tablet 0  . Semaglutide, 1 MG/DOSE, (OZEMPIC, 1 MG/DOSE,) 2 MG/1.5ML SOPN Inject 1 mg into the skin once a week. 2 pen 11  . sertraline (ZOLOFT) 100 MG tablet Take 1 tablet (100 mg total) by mouth at bedtime. 90 tablet 1  . sucralfate (CARAFATE) 1 g tablet Take 1 tablet (1 g total) by mouth 4 (four) times daily -  with meals and at bedtime. 40 tablet 0  . SUMAtriptan (IMITREX) 100 MG tablet At the onset of a migraine; may repeat in 2 hours. Max daily dose 256m (Patient taking differently: Take 100 mg by mouth every 2 (two) hours as needed for migraine. ) 20 tablet 6  . topiramate (TOPAMAX) 100 MG tablet Take 1 tablet (100 mg total) by mouth 2 (two) times daily. 180 tablet 1  . [DISCONTINUED] fluticasone-salmeterol (ADVAIR HFA) 230-21 MCG/ACT inhaler Inhale 2 puffs into the lungs 2 (two) times daily. 1 Inhaler 3    Objective: BP (!) 114/94   Pulse 86   Temp 98.3 F (36.8 C) (Oral)   Resp 19   LMP  (LMP Unknown)   SpO2 97%  Exam: General: Alert, Oriented, In acute distress, Sating well at room air Eyes: EOM intact Neck: Tracheostomy tube midline, occasional green phlegm expressed from tube via coughing. Cardiovascular: S1S2 normal, RRR, No M/R/G Respiratory: Diffuse wheezing present B/L. Mild basilar crackles present sounds like upper airway sounds. Gastrointestinal: soft, not tender, BS +nt MSK: Extremities well perfused. Dorsalis pedis pulses b/l equal and strong. Neuro: No focal deficit. Psych: Mood and Affect normal  Labs and Imaging: CBC BMET  Recent Labs  Lab 09/16/20 0925  WBC 7.7  HGB 11.7*  HCT 36.5  PLT 317   Recent Labs  Lab 09/16/20 0925  NA 137  K 3.9  CL 105  CO2 20*  BUN 13  CREATININE 1.19*  GLUCOSE 169*  CALCIUM 8.9     EKG: Sinus Tachycardia Qtc- 463      DArmando Reichert MD 09/16/2020, 7:23 PM PGY-1, CGallowayIntern pager: [phone] text pages welcome   FPTS Upper-Level Resident Addendum  I have independently interviewed and examined the patient. I have discussed the above with the original author and agree with their documentation. Please see also any attending notes.    HMilus Banister DO PGY-3, CLansingFamily Medicine 09/17/2020 8:02 AM  FPTS Service pager: [phone](text pages welcome through [facility]

## 2020-09-17 ENCOUNTER — Encounter (HOSPITAL_COMMUNITY): Payer: Self-pay | Admitting: Family Medicine

## 2020-09-17 DIAGNOSIS — J4541 Moderate persistent asthma with (acute) exacerbation: Secondary | ICD-10-CM

## 2020-09-17 DIAGNOSIS — N1831 Chronic kidney disease, stage 3a: Secondary | ICD-10-CM

## 2020-09-17 DIAGNOSIS — Z794 Long term (current) use of insulin: Secondary | ICD-10-CM

## 2020-09-17 DIAGNOSIS — Z930 Tracheostomy status: Secondary | ICD-10-CM

## 2020-09-17 DIAGNOSIS — U071 COVID-19: Principal | ICD-10-CM

## 2020-09-17 DIAGNOSIS — J398 Other specified diseases of upper respiratory tract: Secondary | ICD-10-CM

## 2020-09-17 DIAGNOSIS — E1322 Other specified diabetes mellitus with diabetic chronic kidney disease: Secondary | ICD-10-CM

## 2020-09-17 LAB — BASIC METABOLIC PANEL
Anion gap: 11 (ref 5–15)
BUN: 17 mg/dL (ref 6–20)
CO2: 21 mmol/L — ABNORMAL LOW (ref 22–32)
Calcium: 8.7 mg/dL — ABNORMAL LOW (ref 8.9–10.3)
Chloride: 102 mmol/L (ref 98–111)
Creatinine, Ser: 1.26 mg/dL — ABNORMAL HIGH (ref 0.44–1.00)
GFR, Estimated: 52 mL/min — ABNORMAL LOW (ref >60)
Glucose, Bld: 372 mg/dL — ABNORMAL HIGH (ref 70–99)
Potassium: 4.5 mmol/L (ref 3.5–5.1)
Sodium: 134 mmol/L — ABNORMAL LOW (ref 135–145)

## 2020-09-17 LAB — CBC
HCT: 34.1 % — ABNORMAL LOW (ref 36.0–46.0)
Hemoglobin: 11.4 g/dL — ABNORMAL LOW (ref 12.0–15.0)
MCH: 28.9 pg (ref 26.0–34.0)
MCHC: 33.4 g/dL (ref 30.0–36.0)
MCV: 86.3 fL (ref 80.0–100.0)
Platelets: 315 10*3/uL (ref 150–400)
RBC: 3.95 MIL/uL (ref 3.87–5.11)
RDW: 13.8 % (ref 11.5–15.5)
WBC: 5.9 10*3/uL (ref 4.0–10.5)
nRBC: 0 % (ref 0.0–0.2)

## 2020-09-17 LAB — C-REACTIVE PROTEIN: CRP: 4.5 mg/dL — ABNORMAL HIGH (ref <1.0)

## 2020-09-17 LAB — CBG MONITORING, ED
Glucose-Capillary: 261 mg/dL — ABNORMAL HIGH (ref 70–99)
Glucose-Capillary: 179 mg/dL — ABNORMAL HIGH (ref 70–99)
Glucose-Capillary: 173 mg/dL — ABNORMAL HIGH (ref 70–99)

## 2020-09-17 LAB — FERRITIN: Ferritin: 542 ng/mL — ABNORMAL HIGH (ref 11–307)

## 2020-09-17 LAB — D-DIMER, QUANTITATIVE: D-Dimer, Quant: 0.97 ug/mL-FEU — ABNORMAL HIGH (ref 0.00–0.50)

## 2020-09-17 LAB — GLUCOSE, CAPILLARY: Glucose-Capillary: 144 mg/dL — ABNORMAL HIGH (ref 70–99)

## 2020-09-17 MED ORDER — SODIUM CHLORIDE 0.9 % IV SOLN
100.0000 mg | Freq: Every day | INTRAVENOUS | Status: AC
  Administered 2020-09-17 – 2020-09-20 (×4): 100 mg via INTRAVENOUS
  Filled 2020-09-17 (×4): qty 20

## 2020-09-17 MED ORDER — ALBUTEROL SULFATE HFA 108 (90 BASE) MCG/ACT IN AERS
2.0000 | INHALATION_SPRAY | Freq: Four times a day (QID) | RESPIRATORY_TRACT | Status: DC
  Administered 2020-09-17: 2 via RESPIRATORY_TRACT

## 2020-09-17 MED ORDER — ALBUTEROL SULFATE HFA 108 (90 BASE) MCG/ACT IN AERS
2.0000 | INHALATION_SPRAY | RESPIRATORY_TRACT | Status: DC | PRN
  Administered 2020-09-17 – 2020-09-19 (×3): 2 via RESPIRATORY_TRACT

## 2020-09-17 MED ORDER — IPRATROPIUM BROMIDE HFA 17 MCG/ACT IN AERS
2.0000 | INHALATION_SPRAY | Freq: Four times a day (QID) | RESPIRATORY_TRACT | Status: DC
  Administered 2020-09-17 – 2020-09-21 (×17): 2 via RESPIRATORY_TRACT
  Filled 2020-09-17: qty 12.9

## 2020-09-17 MED ORDER — LACTATED RINGERS IV BOLUS
500.0000 mL | Freq: Once | INTRAVENOUS | Status: AC
  Administered 2020-09-17: 500 mL via INTRAVENOUS

## 2020-09-17 MED ORDER — GABAPENTIN 300 MG PO CAPS
600.0000 mg | ORAL_CAPSULE | Freq: Every day | ORAL | Status: DC
  Administered 2020-09-17 – 2020-09-20 (×4): 600 mg via ORAL
  Filled 2020-09-17 (×4): qty 2

## 2020-09-17 MED ORDER — ENOXAPARIN SODIUM 60 MG/0.6ML ~~LOC~~ SOLN
60.0000 mg | SUBCUTANEOUS | Status: DC
  Administered 2020-09-18 – 2020-09-21 (×4): 60 mg via SUBCUTANEOUS
  Filled 2020-09-17 (×4): qty 0.6

## 2020-09-17 MED ORDER — INSULIN DETEMIR 100 UNIT/ML ~~LOC~~ SOLN
45.0000 [IU] | Freq: Two times a day (BID) | SUBCUTANEOUS | Status: DC
  Administered 2020-09-17: 40 [IU] via SUBCUTANEOUS
  Administered 2020-09-17 – 2020-09-19 (×4): 45 [IU] via SUBCUTANEOUS
  Filled 2020-09-17 (×6): qty 0.45

## 2020-09-17 MED ORDER — INSULIN DETEMIR 100 UNIT/ML ~~LOC~~ SOLN
45.0000 [IU] | Freq: Two times a day (BID) | SUBCUTANEOUS | Status: DC
  Filled 2020-09-17: qty 0.45

## 2020-09-17 MED ORDER — LISINOPRIL 10 MG PO TABS
10.0000 mg | ORAL_TABLET | Freq: Every day | ORAL | Status: DC
  Filled 2020-09-17: qty 1

## 2020-09-17 MED ORDER — GABAPENTIN 300 MG PO CAPS
300.0000 mg | ORAL_CAPSULE | Freq: Two times a day (BID) | ORAL | Status: DC
  Administered 2020-09-17 – 2020-09-21 (×9): 300 mg via ORAL
  Filled 2020-09-17 (×9): qty 1

## 2020-09-17 NOTE — ED Notes (Addendum)
Attempted to give reported x1. 

## 2020-09-17 NOTE — ED Notes (Addendum)
RT at bedside.

## 2020-09-17 NOTE — Progress Notes (Signed)
Went to see pt this afternoon. Laying in bed with moderate WOB and coughing. She said that she had pain all over and feels worse than the morning. sats 100% on 5L which is more than the morning but needed for the humidified effect for the trach. Spoke with RN who feels she is doing about the same as the morning. Will sign out to the night team to have a low threshold to get CCM/pulm involved overnight if she worsens. Would also get a CTA if she worsens to rule out PE.  Towanda Octave MD PGY-2, Family Medicine

## 2020-09-17 NOTE — Progress Notes (Signed)
Family Medicine Teaching Service Daily Progress Note Intern Pager: [phone]  Patient name: [name] Medical record number: [id number] Date of birth: Dec 16, 1970 Age: 49 y.o. Gender: female  Primary Care Provider: [name], MD Consultants: None Code Status: Full  Pt Overview and Major Events to Date:  12/26 - Admitted  Assessment and Plan: [name] is a 49 y.o. female presenting with SOB . PMH is significant for severe asthma, subglottic stenosis and tracheal stenosis s/p tracheostomy (current),diabetes, hypertension, chronic cough, OSA,h/onormocytic anemia, h/ogastritis and gastroduodenitis, generalized anxiety disorder, depression, morbid obesity, chronic pain syndrome, chronic bilateral low back pain,carpal tunnel syndrome,dysphonia.  Covid positive  SOB  Asthma Patient found to be Covid positive on 12/26, patient had first Covid vaccine dose on 08/29/2020.  Patient is s/p MAB, 1 dose remdesivir and 1 dose Solu-Medrol. If patient continues to worsen and has increased oxygen requirement, consider starting decadron 10mg  daily--at this time do not feel that it is necessary as oxygen requirement is through by-blow air and patient was maintaining saturations well when decreased from it. As patient has a trach, the only other option if worsening oxygen requirements is to start the patient on the vent, which would require CCM as vents are not managed on the floor.   - Airborne and contact precautions - Vitals per routine - Continuous pulse ox - O2 as needed to maintain sat> 92% - Daily labs: ferritin, CRP, D-dimer - Start remdesivir 100 mg infusions daily x4 days (total of 5 days remdesivir - Tylenol 650 mg for fever q6h PRN - Up with assistance - PT/OT eval and treat - Continue Dulera 2 puffs BID, atrovent 2 puffs q6h, albuterol 2 puffs q4h PRN, Singulair 10 mg QHS - Consider need for suction in regards to mucous/secretions with  trach  Diabetes Previously documented as type 1 diabetes, most likely type II per chart review. CBG this a.m. to 61, was 372 on morning BMP.  Patient received 5 units NovoLog overnight. - Increase Levemir to 45U BID - sSSI - CBGs AC and QHS  HTN BP in the last 24 hours ranged from 90-76/68-98.  Most recently, patient has been normotensive at 109/85 -Continue home amlodipine 10mg , lisinopril 20mg , Lasix 20mg  -Continue to monitor BP  H/o normocytic anemia  Hgb stable at 11.4.  Transfusion threshold Hgb <7. - Continue to monitor with CBC  H/o gastritis and gastroduodenitis Discontinued initially reported home Bentyl and Carafate as further med reconciliation showed patient was not taking these as well as concern for anticholinergic effects - Continue Protonix 40 mg BID  GAD  Depression - Continue home meds: bupropion 150 mg BID, Zoloft 100mg  QHS, hydroxyzine 25mg  TID PRN, melatonin QHS  Chronic pain syndrome  low back pain  carpal tunnel syndrome - Continue home Percocet 10-325mg  4 times PRN - Gabapentin adjusted per pharmacy  HLD - Continue home atorvastatin 20 mg  Migraines - Continue home topiramate 100mg  BID  H/o OSA Patient has not worn a CPAP since her trach placement in 2018. -Trach appropriate CPAP if appropriate  FEN/GI: Carb modified diet PPx: Lovenox   Status is: Inpatient  Remains inpatient appropriate because:Inpatient level of care appropriate due to severity of illness   Dispo: The patient is from: Home              Anticipated d/c is to: Home              Anticipated d/c date is: 3 days  Patient currently is not medically stable to d/c.    Subjective:  Patient reports that she is having chest pain with coughing and deep breathing currently.  Just states that she was not able to go to [facility] to have her trach changed out since her admission earlier this month.  Objective: Temp:  [98.1 F (36.7 C)-100.8 F (38.2 C)] 98.2  F (36.8 C) (12/27 0537) Pulse Rate:  [72-115] 80 (12/27 0537) Resp:  [11-33] 20 (12/27 0537) BP: (98-176)/(68-98) 120/75 (12/27 0537) SpO2:  [93 %-100 %] 99 % (12/27 0537) FiO2 (%):  [28 %] 28 % (12/27 0232) Physical Exam: General: Appears ill, sitting up in ER bed, obese Cardiovascular: RRR, no peripheral edema, no M/R/G appreciated Respiratory: Difficult to auscultate due to habitus/upper airway noise from trach and oxygen, patient actively coughing with productive greenish sputum with a tinge of blood.  On oxygen at 5 L, decreased to 3 L while in room Abdomen: Obese, soft nontender  Laboratory: Recent Labs  Lab 09/16/20 0925 09/17/20 0341  WBC 7.7 5.9  HGB 11.7* 11.4*  HCT 36.5 34.1*  PLT 317 315   Recent Labs  Lab 09/16/20 0925 09/17/20 0341  NA 137 134*  K 3.9 4.5  CL 105 102  CO2 20* 21*  BUN 13 17  CREATININE 1.19* 1.26*  CALCIUM 8.9 8.7*  GLUCOSE 169* 372*     Imaging/Diagnostic Tests: DG Chest 2 View  Result Date: 09/16/2020 CLINICAL DATA:  Cough and chest pain. EXAM: CHEST - 2 VIEW COMPARISON:  08/26/2020 FINDINGS: Trace airspace at the bases likely atelectatic although superimposed infection not excluded. No edema, pneumothorax, or pleural effusion. The cardiopericardial silhouette is within normal limits for size. Tracheostomy tube again noted. IMPRESSION: Probable basilar atelectasis. Otherwise no acute cardiopulmonary findings. Electronically Signed   By: Kennith Center M.D.   On: 09/16/2020 09:40     Evelena Leyden, DO 09/17/2020, 6:03 AM PGY-1, [facility] FPTS Intern pager: [phone], text pages welcome

## 2020-09-17 NOTE — Progress Notes (Signed)
°   09/17/20 2143  Assess: MEWS Score  Temp 98.4 F (36.9 C)  BP (!) 79/51  Pulse Rate 86  ECG Heart Rate 86  Resp 20  Level of Consciousness Alert  SpO2 96 %  O2 Device Tracheostomy Collar  Patient Activity (if Appropriate) In bed  O2 Flow Rate (L/min) 5 L/min  Assess: MEWS Score  MEWS Temp 0  MEWS Systolic 2  MEWS Pulse 0  MEWS RR 0  MEWS LOC 0  MEWS Score 2  MEWS Score Color Yellow  Assess: if the MEWS score is Yellow or Red  Were vital signs taken at a resting state? Yes  Focused Assessment No change from prior assessment  Early Detection of Sepsis Score *See Row Information* Low  MEWS guidelines implemented *See Row Information* Yes  Treat  MEWS Interventions Other (Comment) (notified MD)  Pain Scale 0-10  Pain Score 0  Take Vital Signs  Increase Vital Sign Frequency  Yellow: Q 2hr X 2 then Q 4hr X 2, if remains yellow, continue Q 4hrs  Escalate  MEWS: Escalate Yellow: discuss with charge nurse/RN and consider discussing with provider and RRT  Notify: Charge Nurse/RN  Name of Charge Nurse/RN Notified Elmyra Ricks RN  Date Charge Nurse/RN Notified 09/17/20  Time Charge Nurse/RN Notified 2145  Notify: Provider  Provider Name/Title Dr. Higinio Plan  Date Provider Notified 09/17/20  Time Provider Notified 2159  Notification Type Page  Notification Reason Other (Comment) (SBP low)  Response See new orders  Date of Provider Response 09/17/20  Time of Provider Response 2202  Notify: Rapid Response  Name of Rapid Response RN Notified n/a  Document  Progress note created (see row info) Yes

## 2020-09-18 ENCOUNTER — Inpatient Hospital Stay (HOSPITAL_BASED_OUTPATIENT_CLINIC_OR_DEPARTMENT_OTHER): Payer: Medicare Other

## 2020-09-18 DIAGNOSIS — N289 Disorder of kidney and ureter, unspecified: Secondary | ICD-10-CM

## 2020-09-18 DIAGNOSIS — J398 Other specified diseases of upper respiratory tract: Secondary | ICD-10-CM | POA: Diagnosis not present

## 2020-09-18 DIAGNOSIS — E1322 Other specified diabetes mellitus with diabetic chronic kidney disease: Secondary | ICD-10-CM | POA: Diagnosis not present

## 2020-09-18 DIAGNOSIS — U071 COVID-19: Secondary | ICD-10-CM | POA: Diagnosis not present

## 2020-09-18 DIAGNOSIS — M7989 Other specified soft tissue disorders: Secondary | ICD-10-CM | POA: Diagnosis not present

## 2020-09-18 LAB — C-REACTIVE PROTEIN: CRP: 2.5 mg/dL — ABNORMAL HIGH (ref <1.0)

## 2020-09-18 LAB — CBC WITH DIFFERENTIAL/PLATELET
Abs Immature Granulocytes: 0.02 10*3/uL (ref 0.00–0.07)
Basophils Absolute: 0 10*3/uL (ref 0.0–0.1)
Basophils Relative: 0 %
Eosinophils Absolute: 0.2 10*3/uL (ref 0.0–0.5)
Eosinophils Relative: 2 %
HCT: 31.1 % — ABNORMAL LOW (ref 36.0–46.0)
Hemoglobin: 10.5 g/dL — ABNORMAL LOW (ref 12.0–15.0)
Immature Granulocytes: 0 %
Lymphocytes Relative: 51 %
Lymphs Abs: 4.3 10*3/uL — ABNORMAL HIGH (ref 0.7–4.0)
MCH: 28.7 pg (ref 26.0–34.0)
MCHC: 33.8 g/dL (ref 30.0–36.0)
MCV: 85 fL (ref 80.0–100.0)
Monocytes Absolute: 0.6 10*3/uL (ref 0.1–1.0)
Monocytes Relative: 7 %
Neutro Abs: 3.3 10*3/uL (ref 1.7–7.7)
Neutrophils Relative %: 40 %
Platelets: 302 10*3/uL (ref 150–400)
RBC: 3.66 MIL/uL — ABNORMAL LOW (ref 3.87–5.11)
RDW: 14.2 % (ref 11.5–15.5)
WBC: 8.4 10*3/uL (ref 4.0–10.5)
nRBC: 0 % (ref 0.0–0.2)

## 2020-09-18 LAB — BASIC METABOLIC PANEL
Anion gap: 10 (ref 5–15)
Anion gap: 13 (ref 5–15)
BUN: 20 mg/dL (ref 6–20)
BUN: 24 mg/dL — ABNORMAL HIGH (ref 6–20)
CO2: 17 mmol/L — ABNORMAL LOW (ref 22–32)
CO2: 22 mmol/L (ref 22–32)
Calcium: 8.1 mg/dL — ABNORMAL LOW (ref 8.9–10.3)
Calcium: 8.4 mg/dL — ABNORMAL LOW (ref 8.9–10.3)
Chloride: 106 mmol/L (ref 98–111)
Chloride: 106 mmol/L (ref 98–111)
Creatinine, Ser: 1.55 mg/dL — ABNORMAL HIGH (ref 0.44–1.00)
Creatinine, Ser: 1.86 mg/dL — ABNORMAL HIGH (ref 0.44–1.00)
GFR, Estimated: 33 mL/min — ABNORMAL LOW (ref >60)
GFR, Estimated: 41 mL/min — ABNORMAL LOW (ref >60)
Glucose, Bld: 134 mg/dL — ABNORMAL HIGH (ref 70–99)
Glucose, Bld: 171 mg/dL — ABNORMAL HIGH (ref 70–99)
Potassium: 4.1 mmol/L (ref 3.5–5.1)
Potassium: 4.3 mmol/L (ref 3.5–5.1)
Sodium: 136 mmol/L (ref 135–145)
Sodium: 138 mmol/L (ref 135–145)

## 2020-09-18 LAB — GLUCOSE, CAPILLARY
Glucose-Capillary: 83 mg/dL (ref 70–99)
Glucose-Capillary: 85 mg/dL (ref 70–99)
Glucose-Capillary: 102 mg/dL — ABNORMAL HIGH (ref 70–99)
Glucose-Capillary: 144 mg/dL — ABNORMAL HIGH (ref 70–99)

## 2020-09-18 LAB — D-DIMER, QUANTITATIVE: D-Dimer, Quant: 0.43 ug/mL-FEU (ref 0.00–0.50)

## 2020-09-18 LAB — FERRITIN: Ferritin: 378 ng/mL — ABNORMAL HIGH (ref 11–307)

## 2020-09-18 MED ORDER — ONDANSETRON HCL 4 MG/2ML IJ SOLN
4.0000 mg | Freq: Four times a day (QID) | INTRAMUSCULAR | Status: DC
  Administered 2020-09-18 – 2020-09-19 (×2): 4 mg via INTRAVENOUS
  Filled 2020-09-18 (×2): qty 2

## 2020-09-18 MED ORDER — BENZONATATE 100 MG PO CAPS
200.0000 mg | ORAL_CAPSULE | Freq: Two times a day (BID) | ORAL | Status: DC | PRN
  Administered 2020-09-18: 200 mg via ORAL
  Filled 2020-09-18: qty 2

## 2020-09-18 MED ORDER — ACETAMINOPHEN 500 MG PO TABS: 1000.0000 mg | ORAL_TABLET | Freq: Three times a day (TID) | ORAL | Status: DC | PRN

## 2020-09-18 MED ORDER — OXYCODONE-ACETAMINOPHEN 5-325 MG PO TABS
1.0000 | ORAL_TABLET | Freq: Four times a day (QID) | ORAL | Status: DC | PRN
  Administered 2020-09-18: 1 via ORAL
  Filled 2020-09-18: qty 1

## 2020-09-18 MED ORDER — ACETAMINOPHEN 650 MG RE SUPP: 650.0000 mg | Freq: Four times a day (QID) | RECTAL | Status: DC | PRN

## 2020-09-18 MED ORDER — OXYCODONE HCL 5 MG PO TABS
5.0000 mg | ORAL_TABLET | Freq: Four times a day (QID) | ORAL | Status: DC | PRN
Start: 2020-09-18 — End: 2020-09-18
  Administered 2020-09-18: 5 mg via ORAL
  Filled 2020-09-18: qty 1

## 2020-09-18 MED ORDER — ACETAMINOPHEN 500 MG PO TABS
1000.0000 mg | ORAL_TABLET | Freq: Three times a day (TID) | ORAL | Status: DC
  Administered 2020-09-18 – 2020-09-21 (×10): 1000 mg via ORAL
  Filled 2020-09-18 (×10): qty 2

## 2020-09-18 MED ORDER — LACTATED RINGERS IV BOLUS
500.0000 mL | Freq: Once | INTRAVENOUS | Status: AC
  Administered 2020-09-18: 500 mL via INTRAVENOUS

## 2020-09-18 MED ORDER — OXYCODONE HCL 5 MG PO TABS
10.0000 mg | ORAL_TABLET | Freq: Four times a day (QID) | ORAL | Status: DC | PRN
  Administered 2020-09-18 – 2020-09-21 (×11): 10 mg via ORAL
  Filled 2020-09-18 (×12): qty 2

## 2020-09-18 MED ORDER — OXYCODONE HCL 5 MG PO TABS: 5.0000 mg | ORAL_TABLET | Freq: Four times a day (QID) | ORAL | Status: DC | PRN

## 2020-09-18 NOTE — Progress Notes (Signed)
FPTS Interim Progress Note  S: Patient previously endorsed chest pain, went to check on patient. Still endorsing chest pain that she notes in the epigastric region that radiates under her right breast. Endorses improving pain symptoms after increasing tylenol dosage earlier today.   O: BP 110/64 (BP Location: Right Arm)   Pulse 87   Temp 99.3 F (37.4 C) (Oral)   Resp 14   Ht 5\' 1"  (1.549 m)   Wt 108.2 kg   LMP  (LMP Unknown)   SpO2 100%   BMI 45.07 kg/m   CV: RRR, no murmurs or gallops auscultated, tenderness upon palpation of the epigastric region along the breast bone bilaterally  Resp: faint wheezing noted bilaterally, on 5L O2 with trach in place Ext: tenderness along all extremities, mild pitting LE edema noted bilaterally, no localized calf tenderness noted   A/P: -Likely MSK related, potentially costochondritis given exam findings in the setting COVID diagnosis. Possibly reflux contributing as well given prior history of GERD and on protonix and sucralfate at home. Symptoms to likely resolve with COVID resolution. -Given the setting of symptoms, need to rule out potential cause of DVT. Ultrasound noted no evidence for DVT and no cystic structure noted within the popliteal fossa. -Continue to monitor and treatment same as previously stated.  , DO 09/18/2020, 5:15 PM PGY-1, [facility] Family Medicine Service pager [phone]

## 2020-09-18 NOTE — Plan of Care (Signed)
  Problem: Coping: Goal: Psychosocial and spiritual needs will be supported Outcome: Progressing   Problem: Respiratory: Goal: Will maintain a patent airway Outcome: Progressing Goal: Complications related to the disease process, condition or treatment will be avoided or minimized Outcome: Progressing   Problem: Education: Goal: Knowledge of General Education information will improve Description: Including pain rating scale, medication(s)/side effects and non-pharmacologic comfort measures Outcome: Progressing

## 2020-09-18 NOTE — Progress Notes (Signed)
S) [name] was assessed after signout.  She is found seated in bed comfortably and just returned to bed from the bathroom.  She had not yet put her oxygen back over her trach and was breathing comfortably and saturating at 99-100%.  She noted that she continues to have body aches and chest pain with coughing.  She is breathing comfortably.  O) BP 108/80 (BP Location: Right Arm)   Pulse 95   Temp 100 F (37.8 C) (Oral)   Resp 16   Ht 5\' 1"  (1.549 m)   Wt 108.2 kg   LMP  (LMP Unknown)   SpO2 100%   BMI 45.07 kg/m   General: Alert and cooperative.  Seated in bed comfortably.  She did suffer a paroxysmal coughing while I was in the room but was able to recover and had no issues with desaturation or shortness of breath throughout the episode. Cardio: Distant heart sounds.  Palpable radial pulse with a regular rate and rhythm. Pulm: Good air movement bilaterally in lower and middle fields.  Mild wheezing during expiratory phase.  No significant work of breathing. Extremities: No peripheral edema. Warm/ well perfused.  Strong radial pulses. Neuro: Cranial nerves grossly intact  A/P) Ms. is a 49 year old woman who has been hospitalized for COVID-19 pneumonia.  Her physical exam and vitals indicate stable condition without worsening shortness of breath.  She does use 5 L of low flow oxygen for humidification though she does not desaturate or become short of breath without supplemental oxygen.  She has not been hypoxic so we have not started Decadron.  No need for additional intervention at this time.  41

## 2020-09-18 NOTE — Progress Notes (Signed)
Bilateral lower extremity venous duplex complete.  Please see CV proc tab for preliminary results. Levin Bacon- RDMS, RVT 4:28 PM  09/18/2020

## 2020-09-18 NOTE — Progress Notes (Signed)
Family Medicine Teaching Service Daily Progress Note Intern Pager: [phone]  Patient name: [name] Medical record number: [id number] Date of birth: [birth year] Age: 50 y.o. Gender: female  Primary Care Provider: [name], MD Consultants: None Code Status: Full  Pt Overview and Major Events to Date:  12/26: Admitted  Assessment and Plan: Brittney Mae Hutchinson-Matthewsis a 49 y.o.femalepresenting with SOB. PMH is significant for severe asthma, subglottic stenosis and tracheal stenosis s/p tracheostomy (current),diabetes, hypertension, chronic cough, OSA,h/onormocytic anemia, h/ogastritis and gastroduodenitis, generalized anxiety disorder, depression, morbid obesity, chronic pain syndrome, chronic bilateral low back pain,carpal tunnel syndrome,dysphonia.  COVID  SOB  Asthma Patient found to be Covid positive on 12/26, patient had first Covid vaccine dose on 08/29/2020.  Patient is s/p MAB, 1 dose remdesivir and 1 dose Solu-Medrol. If patient continues to worsen and has increased oxygen requirement, consider starting decadron 10mg  daily--at this time do not feel that it is necessary as oxygen requirement is through by-blow air and patient was maintaining saturations well when decreased from it. As patient has a trach, the only other option if worsening oxygen requirements is to start the patient on the vent, which would require CCM as vents are not managed on the floor. Today patient's main concern is pain control for myalgias. - Airborne and contact precautions - Vitals per routine - Continuous pulse ox - strict I/Os - O2 as needed to maintain sat> 92% - D-dimer wnl. Elevated CRP, continue to trend. - Start remdesivir 100 mg infusions daily x4 days (total of 5 days remdesivir - Tylenol 1000 mg q8h and oxycodone 5 mg q6h prn for pain relief  - Up with assistance - PT/OT eval and treat - Continue Dulera 2 puffs BID, atrovent 2 puffs q6h, albuterol 2 puffs  q4h PRN, Singulair 10 mg QHS - Consider need for suction in regards to mucous/secretions with trach - If concern for DVT, consider LE [place]  AKI Likely prerenal with contributing factors including medication causes and diuresis. Cr 1.86 from 1.26 yesterday. Given concern for renal function, discontinued carvedilol.  -Continue to monitor creatinine   Type 2 DM  Chronic and stable. This morning CBG 83. -Continue Levemir to 45U BID - sSSI - CBGs AC and QHS  HTN Chronic and stable. Previous concerns for hypotension with multiple low blood pressures, given 1L fluid bolus. Normotensive BP at 110/79. Home amlodipine and lasix discontinued. -Discontinued carvedilol  -consider restarting lasix if creatinine improves tomorrow  -Continue to monitor BP  H/o normocytic anemia  Chronic and stable with baseline Hgb appearing to be 10-11. Most recent Hgb 11.4.  Transfusion threshold Hgb <7. Patient otherwise asymptomatic.   H/o gastritis and gastroduodenitis - Continue Protonix 40 mg BID  GAD  Depression - Continue home meds: bupropion 150 mg BID, Zoloft 100mg  QHS, hydroxyzine 25mg  TID PRN, melatonin QHS  Chronic pain syndrome  low back pain  carpal tunnel syndrome -  Tylenol 1000 mg q8h and oxycodone 5 mg q6h prn - Gabapentin adjusted per pharmacy  HLD - Continue home atorvastatin 20 mg  Migraines - Continue home topiramate 100mg  BID  H/o OSA Patient has not worn a CPAP since her trach placement in 2018. -Trach appropriate CPAP if appropriate   FEN/GI: Carb modified diet  PPx: lovenox 60 mg    Status is: Inpatient  Remains inpatient appropriate because:Inpatient level of care appropriate due to severity of illness   Dispo:  Patient From: Home  Planned Disposition: To be determined  Expected discharge date: 09/20/2020  Medically  stable for discharge: No         Subjective:  Patient had multiple hypotensive episodes overnight, given 1 L bolus. Amlodipine,  lasix and lisinopril discontinued. Patient endorsing worsening myalgias, denies dyspnea. Endorsing cough with occasional phlegm.   Objective: Temp:  [98 F (36.7 C)-99.6 F (37.6 C)] 99.6 F (37.6 C) (12/28 0741) Pulse Rate:  [81-92] 92 (12/28 0741) Resp:  [11-22] 17 (12/28 0741) BP: (79-117)/(51-86) 110/79 (12/28 0741) SpO2:  [91 %-99 %] 97 % (12/28 0741) FiO2 (%):  [21 %] 21 % (12/27 2300) Weight:  [108.2 kg] 108.2 kg (12/27 2226) Physical Exam: General: Patient sitting upright in bed. Neck: no lymphadenopathy noted  Cardiovascular: RRR, no murmurs or gallops auscultated  Respiratory: faint wheezing heard more prominently along the upper lung lobes bilaterally, breathing on 5L with trach in place Abdomen: soft, nontender, presence of active bowel sounds  Extremities: mild edema noted bilaterally, pedal pulses intact bilaterally  Laboratory: Recent Labs  Lab 09/16/20 0925 09/17/20 0341  WBC 7.7 5.9  HGB 11.7* 11.4*  HCT 36.5 34.1*  PLT 317 315   Recent Labs  Lab 09/16/20 0925 09/17/20 0341 09/18/20 0046  NA 137 134* 136  K 3.9 4.5 4.3  CL 105 102 106  CO2 20* 21* 17*  BUN 13 17 24*  CREATININE 1.19* 1.26* 1.86*  CALCIUM 8.9 8.7* 8.1*  GLUCOSE 169* 372* 134*      Imaging/Diagnostic Tests: No results found.  Reece Leader, DO 09/18/2020, 8:18 AM PGY-1, [facility] Family Medicine FPTS Intern pager: [phone], text pages welcome

## 2020-09-18 NOTE — Progress Notes (Signed)
Interim progress note:  Went to evaluate patient due to lower blood pressures.  Her SBP has been ranging 70-90s overnight with MAP above 65, otherwise has been hemodynamically stable.  Today she received carvedilol 25 mg BID, Norvasc 10 mg, lisinopril 20 mg, and Lasix 20 mg for her known hypertension.  She was given an LR 500 cc bolus around 2230 with some improvement.  In discussion with patient, she reports she is doing okay.  Feeling tired with generalized muscle aches.  Denies any chest pain or difficulty breathing.  Reports she has been having no shortness of breath.  In reference to her blood pressure medications, states she actually takes her carvedilol on a daily basis and will take the other 3 occasionally if her blood pressure is elevated.  Reports she is ~normotensive with carvedilol only at home.   Blood pressure (!) 82/58 (MAP 67), pulse 81, temperature 98 F (36.7 C), temperature source Axillary, resp. rate 20, height 5\' 1"  (1.549 m), weight 108.2 kg, SpO2 95 %. Gen: NAD, laying down comfortably Cardiac: Palpable radial pulses bilaterally, regular rate and rhythm Lungs: Upper airway reverberating sounds anteriorly B/L.  Breathing comfortably and able to speak in full sentences.  Trach in place with humidifier/5L oxygen satting appropriately.  No cough during evaluation. Neuro: Alert and oriented.  Can interact with normal conversation and move all extremities spontaneously.  A/P: Hypotension in the setting of known chronic hypertension, currently with COVID-19 illness:  Suspect is related to medication effect as it appears she has been taking substantially less antihypertensives at home, in addition to concurrent acute illness.  Reassuringly is otherwise hemodynamically stable and breathing comfortably on exam, less concerning for decompensation at this time.  Doubt large PE precipitating hypotension given her otherwise stability with consistent oxygen requirement/breathing comfortably.   Will have a low threshold for CTA if requiring increased oxygen or SOB/chest pain.  Will give an additional 500 cc bolus and discontinue Lasix/Norvasc for tomorrow.  Map goal >65. Decreased lisinopril from 20 to 10 mg to continue with carvedilol as she did have some consistently elevated pressures upon arrival.  Will also spread out home Percocet to every 6 PRN, can return to every 4 if BP improved. Will monitor closely.  , DO

## 2020-09-18 NOTE — TOC Initial Note (Signed)
Transition of Care [facility]) - Initial/Assessment Note    Patient Details  Name: [name] MRN: [id number] Date of Birth: August 07, 1971  [facility]) CM/SW Contact:    Lawerance Sabal, RN Phone Number: 09/18/2020, 4:47 PM  Clinical Narrative:                Spoke with patient who deferred dispo planning to spouse. Spoke w spouse over the phone. He confirms that patient is from home, functionally independent, and has been managing her trach for the past 3 years independently. She has all needed trach supplies, managed through [employer], including suction.  CM will follow for potential home oxygen needs as patient progresses and order through [employer] if needed. Spouse states that family will be able to provide transportation home.   Expected Discharge Plan: Home/Self Care Barriers to Discharge: Continued Medical Work up   Patient Goals and CMS Choice Patient states their goals for this hospitalization and ongoing recovery are:: return home at [place]      Expected Discharge Plan and Services Expected Discharge Plan: Home/Self Care   Discharge Planning Services: CM Consult   Living arrangements for the past 2 months: Apartment                                      Prior Living Arrangements/Services Living arrangements for the past 2 months: Apartment Lives with:: Spouse Patient language and need for interpreter reviewed:: Yes        Need for Family Participation in Patient Care: Yes (Comment) Care giver support system in place?: Yes (comment)   Criminal Activity/Legal Involvement Pertinent to Current Situation/Hospitalization: No - Comment as needed  Activities of Daily Living Home Assistive Devices/Equipment: Vent/Trach supplies,CBG Meter ADL Screening (condition at time of admission) Patient's cognitive ability adequate to safely complete daily activities?: Yes Is the patient deaf or have difficulty hearing?: No Does the patient have difficulty  seeing, even when wearing glasses/contacts?: No Does the patient have difficulty concentrating, remembering, or making decisions?: No Patient able to express need for assistance with ADLs?: Yes Does the patient have difficulty dressing or bathing?: No Independently performs ADLs?: Yes (appropriate for developmental age) Does the patient have difficulty walking or climbing stairs?: No Weakness of Legs: None Weakness of Arms/Hands: None  Permission Sought/Granted                  Emotional Assessment       Orientation: : Oriented to Self,Oriented to Place,Oriented to  Time,Oriented to Situation Alcohol / Substance Use: Not Applicable Psych Involvement: No (comment)  Admission diagnosis:  Moderate persistent asthma with exacerbation [J45.41] COVID-19 [U07.1] Patient Active Problem List   Diagnosis Date Noted  . COVID-19 09/16/2020  . Dyspnea 08/26/2020  . Rotator cuff tear 07/28/2019  . Diabetes (HCC) 04/12/2019  . Acute asthma exacerbation 03/22/2019  . Chest pain of uncertain etiology 03/22/2019  . Lactic acidosis 03/22/2019  . Chronic right shoulder pain   . Community acquired pneumonia 10/25/2018  . Asthma, chronic, unspecified asthma severity, with acute exacerbation 10/25/2018  . Acute respiratory failure with hypoxia and hypercapnia (HCC) 10/23/2018  . Acute pain of right shoulder due to trauma 10/05/2018  . Chronic bilateral low back pain without sciatica 10/05/2018  . Dysphonia 10/05/2018  . Severe asthma with exacerbation 10/05/2018  . Rotator cuff arthropathy, right 06/15/2018  . AKI (acute kidney injury) (HCC) 03/06/2018  . Carpal tunnel  syndrome 01/18/2018  . Normocytic anemia 05/06/2017  . Tracheostomy status (HCC) 04/28/2017  . Tracheostomy dependent (HCC) 03/26/2017  . Subglottic stenosis 03/06/2017  . Tracheal stenosis 03/04/2017  . Acute blood loss anemia   . Generalized anxiety disorder   . Morbid obesity (HCC)   . Chronic pain syndrome   .  Gastritis and gastroduodenitis 02/22/2016  . Depression 02/22/2016  . Migraine 01/30/2015  . Bulging lumbar disc 09/05/2013  . Essential hypertension 09/05/2013  . Allergic rhinitis, seasonal 08/11/2012  . Chronic cough 08/11/2012  . Sleep apnea, obstructive 12/04/2011   PCP:  Hoy Register, MD Pharmacy:   [facility], [place] - [address] [place] [address] Phone: [phone] Fax: [phone]  [facility] [address] [place] ([place]), [place] - [address] [place] [address] Phone: [phone] Fax: [phone]     Social Determinants of Health (SDOH) Interventions    Readmission Risk Interventions No flowsheet data found.

## 2020-09-19 DIAGNOSIS — J398 Other specified diseases of upper respiratory tract: Secondary | ICD-10-CM | POA: Diagnosis not present

## 2020-09-19 DIAGNOSIS — U071 COVID-19: Secondary | ICD-10-CM | POA: Diagnosis not present

## 2020-09-19 DIAGNOSIS — N179 Acute kidney failure, unspecified: Secondary | ICD-10-CM

## 2020-09-19 DIAGNOSIS — E1322 Other specified diabetes mellitus with diabetic chronic kidney disease: Secondary | ICD-10-CM | POA: Diagnosis not present

## 2020-09-19 LAB — CBC
HCT: 32.8 % — ABNORMAL LOW (ref 36.0–46.0)
Hemoglobin: 10.4 g/dL — ABNORMAL LOW (ref 12.0–15.0)
MCH: 28 pg (ref 26.0–34.0)
MCHC: 31.7 g/dL (ref 30.0–36.0)
MCV: 88.4 fL (ref 80.0–100.0)
Platelets: 298 10*3/uL (ref 150–400)
RBC: 3.71 MIL/uL — ABNORMAL LOW (ref 3.87–5.11)
RDW: 14.4 % (ref 11.5–15.5)
WBC: 8 10*3/uL (ref 4.0–10.5)
nRBC: 0 % (ref 0.0–0.2)

## 2020-09-19 LAB — BASIC METABOLIC PANEL
Anion gap: 11 (ref 5–15)
BUN: 20 mg/dL (ref 6–20)
CO2: 21 mmol/L — ABNORMAL LOW (ref 22–32)
Calcium: 8.4 mg/dL — ABNORMAL LOW (ref 8.9–10.3)
Chloride: 108 mmol/L (ref 98–111)
Creatinine, Ser: 1.51 mg/dL — ABNORMAL HIGH (ref 0.44–1.00)
GFR, Estimated: 42 mL/min — ABNORMAL LOW (ref >60)
Glucose, Bld: 142 mg/dL — ABNORMAL HIGH (ref 70–99)
Potassium: 4.6 mmol/L (ref 3.5–5.1)
Sodium: 140 mmol/L (ref 135–145)

## 2020-09-19 LAB — GLUCOSE, CAPILLARY
Glucose-Capillary: 105 mg/dL — ABNORMAL HIGH (ref 70–99)
Glucose-Capillary: 118 mg/dL — ABNORMAL HIGH (ref 70–99)
Glucose-Capillary: 77 mg/dL (ref 70–99)
Glucose-Capillary: 115 mg/dL — ABNORMAL HIGH (ref 70–99)

## 2020-09-19 LAB — C-REACTIVE PROTEIN: CRP: 3.9 mg/dL — ABNORMAL HIGH (ref <1.0)

## 2020-09-19 LAB — D-DIMER, QUANTITATIVE: D-Dimer, Quant: 0.58 ug/mL-FEU — ABNORMAL HIGH (ref 0.00–0.50)

## 2020-09-19 LAB — FERRITIN: Ferritin: 508 ng/mL — ABNORMAL HIGH (ref 11–307)

## 2020-09-19 MED ORDER — ONDANSETRON HCL 4 MG/2ML IJ SOLN
4.0000 mg | Freq: Four times a day (QID) | INTRAMUSCULAR | Status: DC | PRN
  Administered 2020-09-20 – 2020-09-21 (×3): 4 mg via INTRAVENOUS
  Filled 2020-09-19 (×3): qty 2

## 2020-09-19 MED ORDER — INSULIN DETEMIR 100 UNIT/ML ~~LOC~~ SOLN
40.0000 [IU] | Freq: Two times a day (BID) | SUBCUTANEOUS | Status: DC
  Administered 2020-09-20: 40 [IU] via SUBCUTANEOUS
  Filled 2020-09-19 (×5): qty 0.4

## 2020-09-19 NOTE — Progress Notes (Addendum)
Family Medicine Teaching Service Daily Progress Note Intern Pager: [phone]  Patient name: [name] Medical record number: [id number] Date of birth: 01-Jul-1971 Age: 49 y.o. Gender: female  Primary Care Provider: [name], MD Consultants: None Code Status: Full  Pt Overview and Major Events to Date:  12/26: Admitted, COVID +  Assessment and Plan: [name] a 49 y.o.femalepresenting with SOB. PMH is significant for severe asthma, subglottic stenosis and tracheal stenosis s/p tracheostomy (current),diabetes, hypertension, chronic cough, OSA,h/onormocytic anemia, h/ogastritis and gastroduodenitis, generalized anxiety disorder, depression, morbid obesity, chronic pain syndrome, chronic bilateral low back pain,carpal tunnel syndrome,dysphonia.  COVID  SOB  Asthma Patient reports myalgias and unchanged productive cough. Currently stable on 5L blow-by. D-dimer 0.43> 0.58, CRP 2.5>3.9, ferritin 378>508. LE doppler negative for DVT. Likely CT PE today. Not currently on decadron as patient has not had hypoxia with true oxygen requirement and uses the by-blow for humidification.  - Airborne and contact precautions - Vitals per routine - Continuous pulse ox - strict I/Os - O2 as needed to maintain sat> 92% - Continue to trend D-dimer, CRP, and ferritin - Continue Remdesivir 100 mg infusions daily (day 4/5) - Tylenol 1000 mg q8h and oxycodone 5 mg q6h prn for pain relief  - Up with assistance - PT/OT eval and treat - Continue Dulera 2 puffs BID, atrovent 2 puffs q6h, albuterol 2 puffs q4h PRN, Singulair 10 mg QHS - Consider need for suction in regards to mucous/secretions with trach  AKI Cr 1.86>1.55. Likely prerenal with contributing factors including medication causes and diuresis. Holding carvedilol for renal function. Currently holding lasix. -Continue to monitor creatinine   Nausea Patient reportedly had vomiting overnight and  was given Zofran with improvement. QTC on 12/27 463. - Zofran 4mg  q6h PRN   Type 2 DM, chronic, stable CBG 105. No novolog given in the last 24h. - Decrease Levemir to 40U BID - sSSI - CBGs AC and QHS  HTN Normotensive at 107/74. Home amlodipine and lasix held. Discontinued carvedilol. Consider restarting Lasix with creatinine improvemnet.  -Continue to monitor BP  H/o normocytic anemia  Hgb 10.5 on 12/28. Baseline Hgb appearing to be 10-11. - Transfusion threshold Hgb <7 - Continue to monitor with CBC  H/o gastritis and gastroduodenitis - Continue Protonix 40 mg BID  GAD  Depression - Continue home meds: bupropion 150 mg BID, Zoloft 100mg  QHS, hydroxyzine 25mg  TID PRN, melatonin QHS  Chronic pain syndrome  low back pain  carpal tunnel syndrome -  Tylenol 1000 mg q8h and oxycodone 5 mg q6h prn - Gabapentin adjusted per pharmacy  HLD - Continue home atorvastatin 20 mg  Migraines - Continue home topiramate 100mg  BID  H/o OSA - Trach appropriate CPAP if necessary    FEN/GI: Carb modified diet  PPx: lovenox 60 mg    Status is: Inpatient  Remains inpatient appropriate because:Inpatient level of care appropriate due to severity of illness   Dispo:  Patient From: Home  Planned Disposition: To be determined  Expected discharge date: 09/20/2020  Medically stable for discharge: No     Subjective:  Patient reports no changes from yesterday, she states she is breathing better than when she was admitted. Chest pain with coughing is still present and unchanged. Patient complains of myalgias.    Objective: Temp:  [98.6 F (37 C)-100 F (37.8 C)] 98.6 F (37 C) (12/29 0717) Pulse Rate:  [83-97] 85 (12/29 0717) Resp:  [14-18] 17 (12/29 0717) BP: (89-126)/(64-80) 107/74 (12/29 0717) SpO2:  [97 %-  100 %] 97 % (12/29 0717) Physical Exam: General: Sitting upright in bed, patient appears tired Cardiovascular: RRR, no M/R/G appreciated Respiratory: Breathing  on 5 L with trach in place, loud upper airway sounds-some difficulty with auscultation.  Patient had just administered her Dulera. Abdomen: Soft, nontender, nondistended, bowel sounds are Extremities: Mild bilateral edema, pedal pulses 2+  Laboratory: Recent Labs  Lab 09/16/20 0925 09/17/20 0341 09/18/20 1328  WBC 7.7 5.9 8.4  HGB 11.7* 11.4* 10.5*  HCT 36.5 34.1* 31.1*  PLT 317 315 302   Recent Labs  Lab 09/17/20 0341 09/18/20 0046 09/18/20 2314  NA 134* 136 138  K 4.5 4.3 4.1  CL 102 106 106  CO2 21* 17* 22  BUN 17 24* 20  CREATININE 1.26* 1.86* 1.55*  CALCIUM 8.7* 8.1* 8.4*  GLUCOSE 372* 134* 171*      Imaging/Diagnostic Tests: VAS [place] LOWER EXTREMITY VENOUS (DVT)  Result Date: 09/18/2020  Lower Venous DVT Study Indications: Leg swelling, Covid-19.  Comparison Study: No prior studies. Performing Technologist: Levada Schilling RDMS, RVT  Examination Guidelines: A complete evaluation includes B-mode imaging, spectral Doppler, color Doppler, and power Doppler as needed of all accessible portions of each vessel. Bilateral testing is considered an integral part of a complete examination. Limited examinations for reoccurring indications may be performed as noted. The reflux portion of the exam is performed with the patient in reverse Trendelenburg.  +---------+---------------+---------+-----------+----------+--------------+ RIGHT    CompressibilityPhasicitySpontaneityPropertiesThrombus Aging +---------+---------------+---------+-----------+----------+--------------+ CFV      Full           Yes      Yes                                 +---------+---------------+---------+-----------+----------+--------------+ SFJ      Full                                                        +---------+---------------+---------+-----------+----------+--------------+ FV Prox  Full                                                         +---------+---------------+---------+-----------+----------+--------------+ FV Mid   Full                                                        +---------+---------------+---------+-----------+----------+--------------+ FV DistalFull                                                        +---------+---------------+---------+-----------+----------+--------------+ PFV      Full                                                        +---------+---------------+---------+-----------+----------+--------------+  POP      Full           Yes      Yes                                 +---------+---------------+---------+-----------+----------+--------------+ PTV      Full                                                        +---------+---------------+---------+-----------+----------+--------------+ PERO     Full                                                        +---------+---------------+---------+-----------+----------+--------------+   +---------+---------------+---------+-----------+----------+--------------+ LEFT     CompressibilityPhasicitySpontaneityPropertiesThrombus Aging +---------+---------------+---------+-----------+----------+--------------+ CFV      Full           Yes      Yes                                 +---------+---------------+---------+-----------+----------+--------------+ SFJ      Full                                                        +---------+---------------+---------+-----------+----------+--------------+ FV Prox  Full                                                        +---------+---------------+---------+-----------+----------+--------------+ FV Mid   Full                                                        +---------+---------------+---------+-----------+----------+--------------+ FV DistalFull                                                         +---------+---------------+---------+-----------+----------+--------------+ PFV      Full                                                        +---------+---------------+---------+-----------+----------+--------------+ POP      Full           Yes      Yes                                 +---------+---------------+---------+-----------+----------+--------------+  PTV      Full                                                        +---------+---------------+---------+-----------+----------+--------------+ PERO     Full                                                        +---------+---------------+---------+-----------+----------+--------------+     Summary: RIGHT: - There is no evidence of deep vein thrombosis in the lower extremity.  - No cystic structure found in the popliteal fossa.  LEFT: - There is no evidence of deep vein thrombosis in the lower extremity.  - No cystic structure found in the popliteal fossa.  *See table(s) above for measurements and observations.    Preliminary     Rise Patience, DO 09/19/2020, 9:34 AM PGY-1, [place] Intern pager: [phone], text pages welcome

## 2020-09-19 NOTE — Plan of Care (Signed)

## 2020-09-20 DIAGNOSIS — Z930 Tracheostomy status: Secondary | ICD-10-CM | POA: Diagnosis not present

## 2020-09-20 DIAGNOSIS — N1831 Chronic kidney disease, stage 3a: Secondary | ICD-10-CM | POA: Diagnosis not present

## 2020-09-20 DIAGNOSIS — E1322 Other specified diabetes mellitus with diabetic chronic kidney disease: Secondary | ICD-10-CM | POA: Diagnosis not present

## 2020-09-20 DIAGNOSIS — U071 COVID-19: Secondary | ICD-10-CM | POA: Diagnosis not present

## 2020-09-20 LAB — GLUCOSE, CAPILLARY
Glucose-Capillary: 115 mg/dL — ABNORMAL HIGH (ref 70–99)
Glucose-Capillary: 130 mg/dL — ABNORMAL HIGH (ref 70–99)
Glucose-Capillary: 138 mg/dL — ABNORMAL HIGH (ref 70–99)
Glucose-Capillary: 143 mg/dL — ABNORMAL HIGH (ref 70–99)

## 2020-09-20 LAB — C-REACTIVE PROTEIN: CRP: 4 mg/dL — ABNORMAL HIGH (ref <1.0)

## 2020-09-20 LAB — BASIC METABOLIC PANEL
Anion gap: 8 (ref 5–15)
BUN: 15 mg/dL (ref 6–20)
CO2: 23 mmol/L (ref 22–32)
Calcium: 8.3 mg/dL — ABNORMAL LOW (ref 8.9–10.3)
Chloride: 107 mmol/L (ref 98–111)
Creatinine, Ser: 1.39 mg/dL — ABNORMAL HIGH (ref 0.44–1.00)
GFR, Estimated: 46 mL/min — ABNORMAL LOW (ref >60)
Glucose, Bld: 174 mg/dL — ABNORMAL HIGH (ref 70–99)
Potassium: 4.2 mmol/L (ref 3.5–5.1)
Sodium: 138 mmol/L (ref 135–145)

## 2020-09-20 LAB — D-DIMER, QUANTITATIVE: D-Dimer, Quant: 0.52 ug/mL-FEU — ABNORMAL HIGH (ref 0.00–0.50)

## 2020-09-20 LAB — FERRITIN: Ferritin: 430 ng/mL — ABNORMAL HIGH (ref 11–307)

## 2020-09-20 MED ORDER — FUROSEMIDE 20 MG PO TABS
20.0000 mg | ORAL_TABLET | Freq: Every day | ORAL | Status: DC
Start: 2020-09-20 — End: 2020-09-22
  Administered 2020-09-20 – 2020-09-21 (×2): 20 mg via ORAL
  Filled 2020-09-20: qty 1

## 2020-09-20 NOTE — Hospital Course (Addendum)
[name] is a 49 y.o. female presenting with SOB . PMH is significant for  severe asthma, subglottic stenosis and tracheal stenosis s/p tracheostomy (current), diabetes, hypertension, chronic cough, OSA, h/o normocytic anemia, h/o gastritis and gastroduodenitis, generalized anxiety disorder, depression, morbid obesity, chronic pain syndrome, chronic bilateral low back pain, carpal tunnel syndrome, dysphonia.  Covid  SOB  Asthma Patient admitted for shortness of breath with reported cough and fever.  Patient was found to be febrile on exam, tachycardic, and tachypneic with O2 sat at 95% on room air.  Patient found to be Covid positive.  CXR reassuring with bibasilar atelectasis negative for pneumonia. Patient used 5 L blow-by for humidification of trach, no desaturations noted when patient off of oxygen.  Patient's home asthma medications were continued during hospitalization.  CTA showed no evidence of PE though did show bilateral subpleural ground-glass pulmonary infiltrate consistent with Covid pneumonia.  Patient was treated with 5 days remdesivir, steroids were held off as patient did not have a true oxygen requirement.  Prior to discharge, patient was able to walk on room air with saturations between 95 to 98% with physical therapy.  AKI, improved Patient admitted with an elevated creatinine.  Medications including carvedilol and Lasix were held upon admission.  Creatinine was monitored and improved, Lasix was restarted with improvement. On discharge, creatinine was stable at 1.39.  Type 2 diabetes Patient's home basal insulin was decreased during admission due to poor oral intake.  Patient was switched to Levemir during hospitalization, then switched back to her home [place] upon discharge.  All other chronic issues were stable and treated with home medications as appropriate.  Issues for follow-up: Repeat CMP to monitor improvement of AKI and elevated creatinine Follow-up  blood sugar monitoring

## 2020-09-20 NOTE — Evaluation (Signed)
Occupational Therapy Treatment Patient Details Name: [name] MRN: [id number] DOB: [birth year] Today's Date: 09/20/2020    History of present illness 49 y.o. female presented 09/16/20 with SOB; +COVID; hypotension; high-risk for PE (having chest pain) however holding off on CT  due to acute kidney injury. LE dopplers negative for DVT. PMH is significant for  severe asthma, subglottic stenosis and tracheal stenosis s/p tracheostomy (current), diabetes, hypertension, chronic cough, OSA, h/o normocytic anemia, h/o gastritis and gastroduodenitis, generalized anxiety disorder, depression, morbid obesity, chronic pain syndrome, chronic bilateral low back pain, carpal tunnel syndrome, dysphonia.   OT comments  PTA pt living with family and functioning at independent level. Pt has trach at baseline, and mostly manages this herself- but does have a nurse check in throughout the week to assist with trach management. At time of eval, pt able to complete mobility with supervision assist at very slow, steady pace. Pt is currently limited by chest pain (RN and MD aware). She is currently able to complete mobility on RA with VSS. Will continue to follow to progress energy conservation skills and to facilitate safe d/c per POC listed below.   Follow Up Recommendations  No OT follow up;Supervision - Intermittent    Equipment Recommendations  Tub/shower seat    Recommendations for Other Services      Precautions / Restrictions Precautions Precautions: Fall Precaution Comments: trach at baseline Restrictions Weight Bearing Restrictions: No       Mobility Bed Mobility Overal bed mobility: Modified Independent             General bed mobility comments: HOB up  Transfers Overall transfer level: Needs assistance Equipment used: None Transfers: Sit to/from Stand Sit to Stand: Supervision         General transfer comment: close guard for safety    Balance Overall  balance assessment: Needs assistance Sitting-balance support: No upper extremity supported;Feet supported Sitting balance-[name]: Good     Standing balance support: No upper extremity supported Standing balance-[name]: Fair Standing balance comment: Able to maintain static standing without UE support                            ADL either performed or assessed with clinical judgement   ADL                                         General ADL Comments: Pt is overall at supervision level for ADL. She is able to complete all needed ADLs phsycially, but requires increased time and cues for energy conservation implementation     Vision Baseline Vision/History: Wears glasses Wears Glasses: At all times Patient Visual Report: No change from baseline     Perception     Praxis      Cognition Arousal/Alertness: Awake/alert Behavior During Therapy: WFL for tasks assessed/performed Overall Cognitive Status: Within Functional Limits for tasks assessed                                          Exercises     Shoulder Instructions       General Comments Per nursing, pt has not been motivated to walk or sit up in chair. She did everything requested during therapy    Pertinent Vitals/  Pain       Pain Assessment: Faces Faces Pain Scale: Hurts even more Pain Location: generalized achiness Pain Descriptors / Indicators: Grimacing;Guarding;Sore Pain Intervention(s): Limited activity within patient's tolerance;Monitored during session;Patient requesting pain meds-RN notified;RN gave pain meds during session  Home Living Family/patient expects to be discharged to:: Private residence Living Arrangements: Spouse/significant other;Children (daughter, grandson) Available Help at Discharge: Family Type of Home: Apartment Home Access: Stairs to enter Technical brewer of Steps: 12 Entrance Stairs-Rails: Right Home Layout: One level      Bathroom Shower/Tub: Corporate investment banker: Standard     Home Equipment: None          Prior Functioning/Environment Level of Independence: Independent        Comments: Family A with IADLs, pt is independent wtih BADLs   Frequency  Min 2X/week        Progress Toward Goals  OT Goals(current goals can now be found in the care plan section)     Acute Rehab OT Goals Patient Stated Goal: to feel better and not cough/hurt with coughing OT Goal Formulation: With patient Time For Goal Achievement: 10/04/20 Potential to Achieve Goals: Good  Plan      Co-evaluation      Reason for Co-Treatment: Complexity of the patient's impairments (multi-system involvement);To address functional/ADL transfers PT goals addressed during session: Mobility/safety with mobility;Balance OT goals addressed during session: ADL's and self-care      AM-PAC OT "6 Clicks" Daily Activity     Outcome Measure   Help from another person eating meals?: None Help from another person taking care of personal grooming?: None Help from another person toileting, which includes using toliet, bedpan, or urinal?: A Little Help from another person bathing (including washing, rinsing, drying)?: A Little Help from another person to put on and taking off regular upper body clothing?: A Little Help from another person to put on and taking off regular lower body clothing?: A Little 6 Click Score: 20    End of Session    OT Visit Diagnosis: Unsteadiness on feet (R26.81);Other abnormalities of gait and mobility (R26.89);Muscle weakness (generalized) (M62.81)   Activity Tolerance Patient tolerated treatment well   Patient Left in chair;with call bell/phone within reach   Nurse Communication Mobility status        Time: [id number] OT Time Calculation (min): 38 min  Charges: OT General Charges $OT Visit: 1 Visit OT Evaluation $OT Eval Moderate Complexity: [place], MSOT, OTR/L [place] [facility] Office Number: [phone] Pager: [phone]  Zenovia Jarred 09/20/2020, 12:56 PM

## 2020-09-20 NOTE — Progress Notes (Signed)
SATURATION QUALIFICATIONS: (This note is used to comply with regulatory documentation for home oxygen)  Patient Saturations on Room Air at Rest = 98%  Patient Saturations on Room Air while Ambulating = 95%  Patient Saturations on -- Liters of oxygen while Ambulating = --%  Patient does not currently require home oxygen.   Jerolyn Center, PT Pager [phone]

## 2020-09-20 NOTE — Consult Note (Signed)
   [facility] CM Inpatient Consult   09/20/2020  [name] 1970-09-25 [id number]   [facility] [[facility]]  Accountable Care Organization [ACO] Patient: [name]   Uunplanned readmission score:  High Risk  Reviewed for less than 30 days readmission hospitalization to check if potential [facility]  [[facility] Care Management service needs.  Review of patient's medical record reveals patient is planned for home.  Spoke with husband [name] [phone] attempted call to bedside with unanswered at this time. Explained [facility] Care Management nurse follow up telephonic calls related to high risk.  He agreed and explained he can let the nurse of patient progress or needs.  Primary Care Provider is [facility]  this provider is listed to provide the transition of care [TOC] for post hospital follow up.  Plan:  Continue to follow progress and disposition to assess for post hospital care management needs.  Assign to [facility] RN CM for post hospital transition as appropriate.  For questions contact:   Charlesetta Shanks, RN BSN CCM [facility]  [phone] business mobile phone Toll free office [phone]  Fax number: [phone] [email or web address]

## 2020-09-20 NOTE — Evaluation (Signed)
Physical Therapy Evaluation Patient Details Name: [name] MRN: [id number] DOB: 1970/11/23 Today's Date: 09/20/2020   History of Present Illness  49 y.o. female presented 09/16/20 with SOB; +COVID; hypotension; high-risk for PE (having chest pain) however holding off on CT  due to acute kidney injury. LE dopplers negative for DVT. PMH is significant for  severe asthma, subglottic stenosis and tracheal stenosis s/p tracheostomy (current), diabetes, hypertension, chronic cough, OSA, h/o normocytic anemia, h/o gastritis and gastroduodenitis, generalized anxiety disorder, depression, morbid obesity, chronic pain syndrome, chronic bilateral low back pain, carpal tunnel syndrome, dysphonia.  Clinical Impression   Pt admitted with above diagnosis. Prior to admission, patient was independent with all basic ADLs and had some assist from family for IADLs. She walked without a device. She currently was slightly unsteady and required 1 person HHA. She was able to walk on room air with sats 95-98%. Pt currently with functional limitations due to the deficits listed below (see PT Problem List). Pt will benefit from skilled PT to increase their independence and safety with mobility to allow discharge to the venue listed below.       Follow Up Recommendations No PT follow up    Equipment Recommendations  None recommended by PT    Recommendations for Other Services       Precautions / Restrictions Precautions Precautions: Fall Precaution Comments: trach at baseline Restrictions Weight Bearing Restrictions: No      Mobility  Bed Mobility Overal bed mobility: Modified Independent             General bed mobility comments: HOB up    Transfers Overall transfer level: Needs assistance Equipment used: None Transfers: Sit to/from Stand Sit to Stand: Supervision         General transfer comment: Supervision for safety to stand with rollator. Increased sway noted when  not using AD   Ambulation/Gait Ambulation/Gait assistance: Min guard Gait Distance (Feet): 40 Feet (standing rest x 2 due to dyspnea) Assistive device: 1 person hand held assist Gait Pattern/deviations: Step-through pattern;Decreased stride length Gait velocity: Decreased   General Gait Details: Min guard for safety. Pt with increased SOB, which limited ambulation tolerance.  Stairs            Wheelchair Mobility    Modified Rankin (Stroke Patients Only)       Balance Overall balance assessment: Needs assistance Sitting-balance support: No upper extremity supported;Feet supported Sitting balance-[name]: Good     Standing balance support: No upper extremity supported Standing balance-[name]: Fair Standing balance comment: Able to maintain static standing without UE support                              Pertinent Vitals/Pain Pain Assessment: Faces Faces Pain Scale: Hurts even more Pain Location: generalized achiness Pain Descriptors / Indicators: Grimacing;Guarding;Sore Pain Intervention(s): Limited activity within patient's tolerance;Monitored during session;Patient requesting pain meds-RN notified;RN gave pain meds during session    Home Living Family/patient expects to be discharged to:: Private residence Living Arrangements: Spouse/significant other;Children (daughter, grandson) Available Help at Discharge: Family Type of Home: Apartment Home Access: Stairs to enter Entrance Stairs-Rails: Right Entrance Stairs-Number of Steps: [place]: One level Home Equipment: None      Prior Function Level of Independence: Independent         Comments: Family A with IADLs, pt is independent wtih BADLs     Hand Dominance   Dominant Hand: Right  Extremity/Trunk Assessment   Upper Extremity Assessment Upper Extremity Assessment: Defer to OT evaluation    Lower Extremity Assessment Lower Extremity Assessment: Overall WFL for  tasks assessed    Cervical / Trunk Assessment Cervical / Trunk Assessment: Normal  Communication   Communication: Tracheostomy;Other (comment) (she plugs trach; states she cannot use a PMV)  Cognition Arousal/Alertness: Awake/alert Behavior During Therapy: WFL for tasks assessed/performed Overall Cognitive Status: Within Functional Limits for tasks assessed                                        General Comments General comments (skin integrity, edema, etc.): Per nursing, pt has not been motivated to walk or sit up in chair. She did everything requested during therapy    Exercises     Assessment/Plan    PT Assessment Patient needs continued PT services  PT Problem List Decreased activity tolerance;Decreased balance;Decreased mobility;Decreased knowledge of use of DME;Decreased knowledge of precautions;Obesity;Pain       PT Treatment Interventions Gait training;DME instruction;Functional mobility training;Stair training;Therapeutic activities;Therapeutic exercise;Balance training;Patient/family education    PT Goals (Current goals can be found in the Care Plan section)  Acute Rehab PT Goals Patient Stated Goal: to feel better and not cough/hurt with coughing PT Goal Formulation: With patient Time For Goal Achievement: 10/04/20 Potential to Achieve Goals: Good    Frequency Min 3X/week   Barriers to discharge        Co-evaluation PT/OT/SLP Co-Evaluation/Treatment: Yes Reason for Co-Treatment: Complexity of the patient's impairments (multi-system involvement);To address functional/ADL transfers (morbid obesity; trach/O2) PT goals addressed during session: Mobility/safety with mobility;Balance         AM-PAC PT "6 Clicks" Mobility  Outcome Measure Help needed turning from your back to your side while in a flat bed without using bedrails?: None Help needed moving from lying on your back to sitting on the side of a flat bed without using bedrails?:  None Help needed moving to and from a bed to a chair (including a wheelchair)?: A Little Help needed standing up from a chair using your arms (e.g., wheelchair or bedside chair)?: A Little Help needed to walk in hospital room?: A Little Help needed climbing 3-5 steps with a railing? : A Lot 6 Click Score: 19    End of Session   Activity Tolerance: Patient limited by fatigue Patient left: with call bell/phone within reach;in chair Nurse Communication: Mobility status;Patient requests pain meds PT Visit Diagnosis: Difficulty in walking, not elsewhere classified (R26.2)    Time: 1308-6578 PT Time Calculation (min) (ACUTE ONLY): 38 min   Charges:   PT Evaluation $PT Eval Moderate Complexity: 1 Mod PT Treatments $Therapeutic Activity: 8-22 mins         Jerolyn Center, PT Pager [phone]   Zena Amos 09/20/2020, 11:40 AM

## 2020-09-20 NOTE — Plan of Care (Signed)

## 2020-09-20 NOTE — Progress Notes (Signed)
Family Medicine Teaching Service Daily Progress Note Intern Pager: [phone]  Patient name: [name] Medical record number: [id number] Date of birth: 1971-04-17 Age: 49 y.o. Gender: female  Primary Care Provider: Hoy Register, MD Consultants: None Code Status: Full  Pt Overview and Major Events to Date:  12/26: Admitted, COVID +  Assessment and Plan: Brittney Mae Hutchinson-Matthewsis a 49 y.o.femalepresenting with SOB. PMH is significant for severe asthma, subglottic stenosis and tracheal stenosis s/p tracheostomy (current),diabetes, hypertension, chronic cough, OSA,h/onormocytic anemia, h/ogastritis and gastroduodenitis, generalized anxiety disorder, depression, morbid obesity, chronic pain syndrome, chronic bilateral low back pain,carpal tunnel syndrome,dysphonia.  COVID  SOB  Asthma Patient still reports myalgias and cough, she states her breathing has improved somewhat. Currently stable on 5L blow-by. D-dimer 0.43> 0.58>0.52, CRP 2.5>3.9>4, ferritin 295>284>132. Patient considering possibly doing a CT PE.  - Airborne and contact precautions - Vitals per routine - Continuous pulse ox - strict I/Os - O2 as needed to maintain sat> 92% - Continue to trend D-dimer, CRP, and ferritin - Continue Remdesivir 100 mg infusions daily (day 5/5) - Tylenol 1000 mg q8h and oxycodone 5 mg q6h prn for pain relief  - Up with assistance - PT/OT eval and treat - Continue Dulera 2 puffs BID, atrovent 2 puffs q6h, albuterol 2 puffs q4h PRN, Singulair 10 mg QHS - Consider need for suction in regards to mucous/secretions with trach  AKI, improving Cr 1.39. Holding carvedilol for renal function. Restarting home lasxi. -Continue to monitor creatinine   Nausea, improved QTC on 12/27 463. - Zofran 4mg  q6h PRN   Type 2 DM, chronic, stable CBG 174. No novolog given in the last 24h. - Continue Levemir to 40U BID - sSSI - CBGs AC and QHS  HTN, stable Normotensive  at 107/74. Home amlodipine and lasix held. Discontinued carvedilol. -Continue to monitor BP - Restarting home lasix  H/o normocytic anemia, stable Hgb 10.4. Baseline Hgb appearing to be 10-11. - Transfusion threshold Hgb <7 - Continue to monitor with CBC  H/o gastritis and gastroduodenitis - Continue Protonix 40 mg BID  GAD  Depression, stable - Continue home meds: bupropion 150 mg BID, Zoloft 100mg  QHS, hydroxyzine 25mg  TID PRN, melatonin QHS  Chronic pain syndrome  low back pain  carpal tunnel syndrome -  Tylenol 1000 mg q8h and oxycodone 5 mg q6h prn - Gabapentin adjusted per pharmacy  HLD - Continue home atorvastatin 20 mg  Migraines - Continue home topiramate 100mg  BID  H/o OSA - Trach appropriate CPAP if necessary    FEN/GI: Carb modified diet  PPx: lovenox 60 mg    Status is: Inpatient  Remains inpatient appropriate because:Inpatient level of care appropriate due to severity of illness   Dispo:  Patient From: Home  Planned Disposition: To be determined  Expected discharge date: 09/20/2020  Medically stable for discharge: No    Subjective:  Patient working with PT during the exam. She states that she is still having significant myalgias, and her cough remains unchanged for the most part. She does report improvement in her breathing.  Objective: Temp:  [98.1 F (36.7 C)-99 F (37.2 C)] 98.9 F (37.2 C) (12/30 0410) Pulse Rate:  [78-87] 78 (12/30 0410) Resp:  [12-19] 16 (12/30 0410) BP: (98-137)/(74-90) 100/78 (12/30 0410) SpO2:  [96 %-100 %] 98 % (12/30 0410) FiO2 (%):  [28 %] 28 % (12/29 1925) Physical Exam: General: Upright in bed, tired, working with PT about to transition to chair Cardiovascular: RRR, no M/R/G appreciated Respiratory: Breathing on 5  L with trach in place, diffuse mild wheezing present Abdomen: Soft, nontender, nondistended, bowel sounds are normoacitve Extremities: Mild bilateral edema, pedal pulses  2+  Laboratory: Recent Labs  Lab 09/17/20 0341 09/18/20 1328 09/19/20 1520  WBC 5.9 8.4 8.0  HGB 11.4* 10.5* 10.4*  HCT 34.1* 31.1* 32.8*  PLT 315 302 298   Recent Labs  Lab 09/18/20 2314 09/19/20 0040 09/20/20 0026  NA 138 140 138  K 4.1 4.6 4.2  CL 106 108 107  CO2 22 21* 23  BUN 20 20 15   CREATININE 1.55* 1.51* 1.39*  CALCIUM 8.4* 8.4* 8.3*  GLUCOSE 171* 142* 174*      Imaging/Diagnostic Tests: No results found.  Rise Patience, DO 09/20/2020, 6:05 AM PGY-1, [place] Intern pager: [phone], text pages welcome

## 2020-09-21 ENCOUNTER — Inpatient Hospital Stay (HOSPITAL_COMMUNITY): Payer: Medicare Other

## 2020-09-21 DIAGNOSIS — U071 COVID-19: Secondary | ICD-10-CM | POA: Diagnosis not present

## 2020-09-21 LAB — GLUCOSE, CAPILLARY
Glucose-Capillary: 173 mg/dL — ABNORMAL HIGH (ref 70–99)
Glucose-Capillary: 157 mg/dL — ABNORMAL HIGH (ref 70–99)

## 2020-09-21 LAB — BASIC METABOLIC PANEL
Anion gap: 11 (ref 5–15)
BUN: 12 mg/dL (ref 6–20)
CO2: 24 mmol/L (ref 22–32)
Calcium: 8.7 mg/dL — ABNORMAL LOW (ref 8.9–10.3)
Chloride: 105 mmol/L (ref 98–111)
Creatinine, Ser: 1.39 mg/dL — ABNORMAL HIGH (ref 0.44–1.00)
GFR, Estimated: 46 mL/min — ABNORMAL LOW (ref >60)
Glucose, Bld: 164 mg/dL — ABNORMAL HIGH (ref 70–99)
Potassium: 4.1 mmol/L (ref 3.5–5.1)
Sodium: 140 mmol/L (ref 135–145)

## 2020-09-21 LAB — C-REACTIVE PROTEIN: CRP: 5.2 mg/dL — ABNORMAL HIGH (ref <1.0)

## 2020-09-21 LAB — FERRITIN: Ferritin: 428 ng/mL — ABNORMAL HIGH (ref 11–307)

## 2020-09-21 LAB — D-DIMER, QUANTITATIVE: D-Dimer, Quant: 0.5 ug/mL-FEU (ref 0.00–0.50)

## 2020-09-21 MED ORDER — IOHEXOL 350 MG/ML SOLN
80.0000 mL | Freq: Once | INTRAVENOUS | Status: AC | PRN
  Administered 2020-09-21: 80 mL via INTRAVENOUS

## 2020-09-21 MED ORDER — BENZONATATE 200 MG PO CAPS: 200.0000 mg | ORAL_CAPSULE | Freq: Two times a day (BID) | ORAL | 0 refills | Status: DC | PRN

## 2020-09-21 MED ORDER — ONDANSETRON HCL 4 MG PO TABS: 4.0000 mg | ORAL_TABLET | Freq: Three times a day (TID) | ORAL | 0 refills | Status: DC | PRN

## 2020-09-21 MED ORDER — POLYETHYLENE GLYCOL 3350 17 G PO PACK: 17.0000 g | PACK | Freq: Every day | ORAL | 0 refills | Status: DC

## 2020-09-21 NOTE — Discharge Summary (Addendum)
[facility] Discharge Summary  Patient name: [name] Medical record number: [id number] Date of birth: Dec 04, 1970 Age: 49 y.o. Gender: female Date of Admission: 09/16/2020  Date of Discharge: 09/21/2020  Admitting Physician: Brittney Malay, MD  Primary Care Provider: [name], MD Consultants: none  Indication for Hospitalization: Dyspnea, Covid-19 infection  Discharge Diagnoses/Problem List:  Principal Problem:   COVID-19 Active Problems:   Migraine   Morbid obesity ([place])   Tracheal stenosis   Tracheostomy status ([facility])   Diabetes ([place])   HTN   Normocytic anemia   History of gastroduodenitis   GAD   Depression   Chronic low back pain   Carpal tunnel syndrome   Migraines   OSA  Disposition: home  Discharge Condition: Stable, improved  Discharge Exam:  General: Obese female, appears uncomfortable, coughing intermittently, NAD CV: RRR, no murmurs Pulm: diffuse expiratory wheezes throughout, tracheostomy in place, breathing comfortably without supplemental O2 Abd: soft, diffuse tenderness Ext: WWP, no edema   Brief Hospital Course:  [name] is a 49 y.o. female presenting with SOB . PMH is significant for  severe asthma, subglottic stenosis and tracheal stenosis s/p tracheostomy (current), diabetes, hypertension, chronic cough, OSA, h/o normocytic anemia, h/o gastritis and gastroduodenitis, generalized anxiety disorder, depression, morbid obesity, chronic pain syndrome, chronic bilateral low back pain, carpal tunnel syndrome, dysphonia.  Covid  SOB  Asthma Patient admitted for shortness of breath with reported cough and fever.  Patient was found to be febrile on exam, tachycardic, and tachypneic with O2 sat at 95% on room air.  Patient found to be Covid positive.  CXR reassuring with bibasilar atelectasis negative for pneumonia. Patient used 5 L blow-by for humidification of trach, no  desaturations noted when patient off of oxygen.  Patient's home asthma medications were continued during hospitalization.  CTA showed no evidence of PE though did show bilateral subpleural ground-glass pulmonary infiltrate consistent with Covid pneumonia.  Patient was treated with 5 days remdesivir, steroids were held off as patient did not have a true oxygen requirement.  Prior to discharge, patient was able to walk on room air with saturations between 95 to 98% with physical therapy.  AKI, improved Patient admitted with an elevated creatinine.  Medications including carvedilol and Lasix were held upon admission.  Creatinine was monitored and improved, Lasix was restarted with improvement. On discharge, creatinine was stable at 1.39.  Type 2 diabetes Patient's home basal insulin was decreased during admission due to poor oral intake.  Patient was switched to Levemir during hospitalization, then switched back to her home [place] upon discharge.  All other chronic issues were stable and treated with home medications as appropriate.  Issues for follow-up: 1. Repeat CMP to monitor improvement of AKI and elevated creatinine 2. Follow-up blood sugar monitoring    Significant Procedures: none  Significant Labs and Imaging:  Recent Labs  Lab 09/17/20 0341 09/18/20 1328 09/19/20 1520  WBC 5.9 8.4 8.0  HGB 11.4* 10.5* 10.4*  HCT 34.1* 31.1* 32.8*  PLT 315 302 298   Recent Labs  Lab 09/18/20 0046 09/18/20 2314 09/19/20 0040 09/20/20 0026 09/21/20 0028  NA 136 138 140 138 140  K 4.3 4.1 4.6 4.2 4.1  CL 106 106 108 107 105  CO2 17* 22 21* 23 24  GLUCOSE 134* 171* 142* 174* 164*  BUN 24* 20 20 15 12   CREATININE 1.86* 1.55* 1.51* 1.39* 1.39*  CALCIUM 8.1* 8.4* 8.4* 8.3* 8.7*      Results/Tests  Pending at Time of Discharge: none  Discharge Medications:  Allergies as of 09/21/2020      Reactions   Reglan [metoclopramide] Other (See Comments)   Panic attack      Medication  List    STOP taking these medications   amLODipine 10 MG tablet Commonly known as: NORVASC   carvedilol 25 MG tablet Commonly known as: COREG   dicyclomine 20 MG tablet Commonly known as: BENTYL   lisinopril 20 MG tablet Commonly known as: ZESTRIL   sucralfate 1 g tablet Commonly known as: Carafate     TAKE these medications   acetaminophen 325 MG tablet Commonly known as: TYLENOL Take 1-2 tablets (325-650 mg total) by mouth every 6 (six) hours as needed for mild pain (pain score 1-3 or temp > 100.5).   albuterol (2.5 MG/3ML) 0.083% nebulizer solution Commonly known as: PROVENTIL Take 3 mLs (2.5 mg total) by nebulization every 6 (six) hours as needed for wheezing or shortness of breath.   albuterol 108 (90 Base) MCG/ACT inhaler Commonly known as: ProAir HFA Inhale 2 puffs into the lungs every 6 (six) hours as needed for wheezing or shortness of breath.   atorvastatin 20 MG tablet Commonly known as: LIPITOR Take 1 tablet (20 mg total) by mouth daily.   benzonatate 200 MG capsule Commonly known as: TESSALON Take 1 capsule (200 mg total) by mouth 2 (two) times daily as needed for cough.   buPROPion 150 MG 12 hr tablet Commonly known as: Wellbutrin SR Take 1 tablet (150 mg total) by mouth 2 (two) times daily.   Dulera 200-5 MCG/ACT Aero Generic drug: mometasone-formoterol Inhale 2 puffs into the lungs in the morning and at bedtime.   furosemide 20 MG tablet Commonly known as: LASIX Take 1 tablet (20 mg total) by mouth daily.   gabapentin 300 MG capsule Commonly known as: NEURONTIN Take 2 capsules (600 mg total) by mouth 2 (two) times daily. What changed:   how much to take  when to take this  additional instructions   hydrOXYzine 25 MG tablet Commonly known as: ATARAX/VISTARIL Take 1 tablet (25 mg total) by mouth 3 (three) times daily as needed for anxiety.   Insulin Pen Needle 31G X 8 MM Misc Inject 1 Units as directed daily.   melatonin 3 MG Tabs  tablet Take 3 mg by mouth at bedtime as needed (for sleep).   Misc. Devices Misc Nebulizer device. Diagnosis - Asthma   montelukast 10 MG tablet Commonly known as: SINGULAIR Take 1 tablet (10 mg total) by mouth at bedtime.   ondansetron 4 MG tablet Commonly known as: Zofran Take 1 tablet (4 mg total) by mouth every 8 (eight) hours as needed for nausea or vomiting.   OneTouch Delica Lancets 01E Misc 1 each by Does not apply route daily. Use to monitor glucose levels daily; E11.9   OneTouch Verio test strip Generic drug: glucose blood Use to monitor glucose levels daily; E11.9   OneTouch Verio w/Device Kit 1 each by Does not apply route daily. Use to monitor glucose levels daily; E11.9   oxyCODONE-acetaminophen 10-325 MG tablet Commonly known as: PERCOCET Take 1 tablet by mouth 4 (four) times daily as needed for pain.   Ozempic (1 MG/DOSE) 2 MG/1.5ML Sopn Generic drug: Semaglutide (1 MG/DOSE) Inject 1 mg into the skin once a week. What changed: when to take this   pantoprazole 40 MG tablet Commonly known as: PROTONIX Take 1 tablet (40 mg total) by mouth 2 (two) times daily.  What changed: when to take this   polyethylene glycol 17 g packet Commonly known as: MiraLax Take 17 g by mouth daily.   sertraline 100 MG tablet Commonly known as: ZOLOFT Take 1 tablet (100 mg total) by mouth at bedtime.   SUMAtriptan 100 MG tablet Commonly known as: IMITREX At the onset of a migraine; may repeat in 2 hours. Max daily dose 232m What changed:   how much to take  how to take this  when to take this  reasons to take this  additional instructions   tiZANidine 4 MG tablet Commonly known as: ZANAFLEX Take 4 mg by mouth every 8 (eight) hours as needed for muscle spasms.   topiramate 100 MG tablet Commonly known as: Topamax Take 1 tablet (100 mg total) by mouth 2 (two) times daily.   TTyler AasFlexTouch 200 UNIT/ML FlexTouch Pen Generic drug: insulin degludec Inject 140  Units into the skin daily.       Discharge Instructions: Please refer to Patient Instructions section of EMR for full details.  Patient was counseled important signs and symptoms that should prompt return to medical care, changes in medications, dietary instructions, activity restrictions, and follow up appointments.   Follow-Up Appointments:   SZola Button MD 09/21/2020, 12:32 PM PGY-1, [facility]

## 2020-09-21 NOTE — TOC Transition Note (Signed)
Transition of Care [facility]) - CM/SW Discharge Note   Patient Details  Name: [name] MRN: [id number] Date of Birth: 07/08/1971  [facility] CM/SW Contact:  Lockie Pares, RN Phone Number: 09/21/2020, 2:10 PM   Clinical Narrative:    Patient does not require oxygen, no PT or OT requirement for home health,  No barriers to DC, plan to DC today.      Barriers to Discharge: Continued Medical Work up   Patient Goals and CMS Choice Patient states their goals for this hospitalization and ongoing recovery are:: return home at DC      Discharge Placement                       Discharge Plan and Services   Discharge Planning Services: CM Consult                                 Social Determinants of Health (SDOH) Interventions     Readmission Risk Interventions No flowsheet data found.

## 2020-09-21 NOTE — Discharge Instructions (Signed)
Dear Rich Brave [name],   Thank you so much for allowing [place] to be part of your care!  You were admitted to [facility] for Covid-19 infection. You were treated with an antiviral called remdesivir. You had a CT scan which did not show any evidence of a blood clot in your lungs. Please continue to quarantine at home at least until you are feeling better.   POST-HOSPITAL & CARE INSTRUCTIONS 1. Restart your home [place]. 2. Take Miralax daily to prevent constipation. 3. Please let PCP/Specialists know of any changes that were made.  4. Please see medications section of this packet for any medication changes.   DOCTOR'S APPOINTMENT & FOLLOW UP CARE INSTRUCTIONS  Future Appointments  Date Time Provider Department Center  10/10/2020  2:50 PM Hoy Register, MD [facility] None  10/29/2020  3:15 PM Romero Belling, MD [facility] None  11/12/2020  4:10 PM Hoy Register, MD [facility] None    RETURN PRECAUTIONS:   Take care and be well!  Family Medicine Teaching Service  [place]  [facility]  [address] [place], [place] [address] [phone]

## 2020-09-25 ENCOUNTER — Other Ambulatory Visit: Payer: Self-pay | Admitting: *Deleted

## 2020-09-25 ENCOUNTER — Telehealth: Payer: Self-pay

## 2020-09-25 ENCOUNTER — Encounter: Payer: Self-pay | Admitting: *Deleted

## 2020-09-25 NOTE — Telephone Encounter (Signed)
Transition Care Management Follow-up Telephone Call  Date of discharge and from where: 09/21/2020, [facility]   How have you been since you were released from the hospital? She said she is doing much better and she understands that she is to remain in isolation at home.  Any questions or concerns? No  Items Reviewed:  Did the pt receive and understand the discharge instructions provided? Yes   Medications obtained and verified? Yes  - she said she has all medications and did not have any questions about the med regime at this time. She said that she understands that the med list notes multiple medications she needs to stop taking.   Other? No   Any new allergies since your discharge? No   Do you have support at home? Yes , she lives with her husband.   Home Care and Equipment/Supplies: Were home health services ordered? no If so, what is the name of the agency? n/a Has the agency set up a time to come to the patient's home? n/a Were any new equipment or medical supplies ordered?  No What is the name of the medical supply agency? n/a Were you able to get the supplies/equipment? n/a Do you have any questions related to the use of the equipment or supplies? No, n/a   She already has trach supplies and suction as well as a glucometer and nebulizer.    She receives PCS services   Functional Questionnaire: (I = Independent and D = Dependent) ADLs:independent, family provides assistance as needed   Follow up appointments reviewed:   PCP Hospital f/u appt confirmed? Yes  - Dr Alvis Lemmings 10/10/2020. Virtual visit  Specialist Hospital f/u appt confirmed? Yes  - endocrinology 10/29/2020.   Are transportation arrangements needed? No   If their condition worsens, is the pt aware to call PCP or go to the Emergency Dept.?  yes  Was the patient provided with contact information for the PCP's office or ED?  She has the phone number for the clinic  Was to pt encouraged to call back  with questions or concerns?  yes

## 2020-09-25 NOTE — Telephone Encounter (Signed)
From the discharge call.  She has a virtual appointment with Dr Alvis Lemmings 10/11/2019.   She said she is doing much better and she understands that she is to remain in isolation at home.  she said she has all medications and did not have any questions about the med regime at this time. She said that she understands that the med list notes multiple medications she needs to stop taking.    No questions or concerns at this time

## 2020-09-25 NOTE — Patient Outreach (Signed)
[facility]) Care Management  09/25/2020  Devora Tortorella [name] 1970-10-26 [id number]   [place] Telephone Assessment/Screen for post hospital/complex care referral  Referral Date: 09/20/20 Referral Source: [facility] hospital liaison Referral Reason: High risk readmission   Diagnoses of COPD/ Pneumonia  Other  Other Diagnosis: COVID pos hx trach  Please assign to [facility] RN Care Coordinator for complex care and disease management follow up calls and assess for further needs. Husband is Leonette Most [phone].  Insurance:  [employer] care [facility]) Last admission 09/16/20-09/21/20- covid dyspnea, migraine, morbid obesity trach, DM, HTN, GAD, depression, anemia, OSA   Outreach attempt # 1 successful at [phone] Patient is able to verify HIPAA, DOB and address Reviewed and addressed referral to [facility] with patient Consent: [facility] RN CM reviewed [facility] services with patient. Patient gave verbal consent for services [facility] telephonic RN CM.  Transition of care services noted to be completed by primary care MD office staff- Laural Benes RN CM [facility] [facility] Dr Alvis Lemmings Transition of Care will be completed by primary care provider office who will refer to [facility] care management if needed.  Post hospital follow up Patient reports she is doing fine and denies needs  She confirm she had transition of care call from [facility] RN CM who completed a similar assessment  She confirms she prefers not to do a lot of speaking related to her trach Yes and No questions asked  Trach managed by pt for 3 years independently- supplies via [employer]  [facility] case closure discussed with option of her outreaching to [facility] RN CM prn   Social: Lives at home with family with good support  Independent with care needs  Patient Active Problem List   Diagnosis Date Noted  . COVID-19 09/16/2020  . Dyspnea 08/26/2020  . Rotator cuff tear 07/28/2019  . Diabetes (HCC) 04/12/2019  . Acute asthma  exacerbation 03/22/2019  . Chest pain of uncertain etiology 03/22/2019  . Lactic acidosis 03/22/2019  . Chronic right shoulder pain   . Community acquired pneumonia 10/25/2018  . Asthma, chronic, unspecified asthma severity, with acute exacerbation 10/25/2018  . Acute respiratory failure with hypoxia and hypercapnia (HCC) 10/23/2018  . Acute pain of right shoulder due to trauma 10/05/2018  . Chronic bilateral low back pain without sciatica 10/05/2018  . Dysphonia 10/05/2018  . Severe asthma with exacerbation 10/05/2018  . Rotator cuff arthropathy, right 06/15/2018  . AKI (acute kidney injury) (HCC) 03/06/2018  . Carpal tunnel syndrome 01/18/2018  . Normocytic anemia 05/06/2017  . Tracheostomy status (HCC) 04/28/2017  . Tracheostomy dependent (HCC) 03/26/2017  . Subglottic stenosis 03/06/2017  . Tracheal stenosis 03/04/2017  . Acute blood loss anemia   . Generalized anxiety disorder   . Morbid obesity (HCC)   . Chronic pain syndrome   . Gastritis and gastroduodenitis 02/22/2016  . Depression 02/22/2016  . Migraine 01/30/2015  . Bulging lumbar disc 09/05/2013  . Essential hypertension 09/05/2013  . Allergic rhinitis, seasonal 08/11/2012  . Chronic cough 08/11/2012  . Sleep apnea, obstructive 12/04/2011    DME: vent/trach, cbg meter  Appointments: Post hospital virtual  follow up with pcp 10/10/20    Plan:  Patient requests no follow up at this time Followed by [facility] RN CM  [facility] RN CM will close case Pt agreed to Call [facility] RN CM as needed  Letter sent   Cabella Kimm L. Noelle Penner, RN, BSN, CCM [facility] Telephonic Care Management Care Coordinator Office number [phone] Mobile number [phone])  [place] number [phone] Fax number [phone]

## 2020-10-04 ENCOUNTER — Telehealth: Payer: Self-pay | Admitting: Endocrinology

## 2020-10-04 ENCOUNTER — Other Ambulatory Visit: Payer: Self-pay | Admitting: Family Medicine

## 2020-10-04 DIAGNOSIS — E109 Type 1 diabetes mellitus without complications: Secondary | ICD-10-CM

## 2020-10-04 DIAGNOSIS — K219 Gastro-esophageal reflux disease without esophagitis: Secondary | ICD-10-CM

## 2020-10-04 MED ORDER — ONETOUCH VERIO VI STRP: ORAL_STRIP | 2 refills | Status: DC

## 2020-10-04 MED ORDER — TRESIBA FLEXTOUCH 200 UNIT/ML ~~LOC~~ SOPN: 140.0000 [IU] | PEN_INJECTOR | Freq: Every day | SUBCUTANEOUS | 3 refills | Status: DC

## 2020-10-04 MED ORDER — OZEMPIC (1 MG/DOSE) 2 MG/1.5ML ~~LOC~~ SOPN: 1.0000 mg | PEN_INJECTOR | SUBCUTANEOUS | 3 refills | Status: DC

## 2020-10-04 MED ORDER — PANTOPRAZOLE SODIUM 40 MG PO TBEC: 40.0000 mg | DELAYED_RELEASE_TABLET | Freq: Two times a day (BID) | ORAL | 0 refills | Status: DC

## 2020-10-04 NOTE — Telephone Encounter (Signed)
REFILL FOR  [name], and one touch test strips  [place] ([place]), [place] - [address]  [address] [facility] [place] [address]  Phone:  [phone] Fax:  [phone]

## 2020-10-04 NOTE — Telephone Encounter (Signed)
Rxs sent

## 2020-10-09 ENCOUNTER — Telehealth: Payer: Self-pay | Admitting: Family Medicine

## 2020-10-09 NOTE — Telephone Encounter (Signed)
Call placed to patient and LVM letting her know that her appointment for tomorrow afternoon had been cancelled due to the provider being out of the office. Apologized to the patient for the super short notice and advised patient to call back [phone] to schedule.

## 2020-10-10 ENCOUNTER — Telehealth: Payer: Medicaid Other | Admitting: Family Medicine

## 2020-10-29 ENCOUNTER — Encounter: Payer: Self-pay | Admitting: Orthopedic Surgery

## 2020-10-29 ENCOUNTER — Encounter: Payer: Self-pay | Admitting: Family Medicine

## 2020-10-29 ENCOUNTER — Ambulatory Visit: Payer: Medicare Other | Admitting: Endocrinology

## 2020-10-30 ENCOUNTER — Other Ambulatory Visit: Payer: Self-pay | Admitting: Family Medicine

## 2020-10-30 DIAGNOSIS — K0381 Cracked tooth: Secondary | ICD-10-CM

## 2020-10-30 MED ORDER — ONDANSETRON HCL 4 MG PO TABS
4.0000 mg | ORAL_TABLET | Freq: Three times a day (TID) | ORAL | 0 refills | Status: DC | PRN
Start: 2020-10-30 — End: 2020-12-08

## 2020-10-30 NOTE — Telephone Encounter (Signed)
It's been a while since we saw her, I would recommend coming back to see Dr. Marlou Sa if she is having continued pain or if she had substantial relief from her last ESI, we can send her back to Dr. Ernestina Patches to try another

## 2020-11-07 ENCOUNTER — Other Ambulatory Visit: Payer: Self-pay | Admitting: Family Medicine

## 2020-11-07 MED ORDER — BENZONATATE 200 MG PO CAPS
200.0000 mg | ORAL_CAPSULE | Freq: Two times a day (BID) | ORAL | 0 refills | Status: DC | PRN
Start: 2020-11-07 — End: 2021-02-22

## 2020-11-12 ENCOUNTER — Ambulatory Visit: Payer: Medicare Other | Admitting: Family Medicine

## 2020-11-14 ENCOUNTER — Other Ambulatory Visit: Payer: Self-pay

## 2020-11-14 ENCOUNTER — Ambulatory Visit: Payer: 59 | Admitting: Orthopedic Surgery

## 2020-11-16 ENCOUNTER — Ambulatory Visit (INDEPENDENT_AMBULATORY_CARE_PROVIDER_SITE_OTHER): Payer: 59 | Admitting: Endocrinology

## 2020-11-16 ENCOUNTER — Other Ambulatory Visit: Payer: Self-pay

## 2020-11-16 VITALS — BP 170/104 | HR 108 | Ht 61.0 in | Wt 234.8 lb

## 2020-11-16 DIAGNOSIS — E109 Type 1 diabetes mellitus without complications: Secondary | ICD-10-CM

## 2020-11-16 DIAGNOSIS — N183 Chronic kidney disease, stage 3 unspecified: Secondary | ICD-10-CM | POA: Diagnosis not present

## 2020-11-16 DIAGNOSIS — E1022 Type 1 diabetes mellitus with diabetic chronic kidney disease: Secondary | ICD-10-CM

## 2020-11-16 LAB — POCT GLYCOSYLATED HEMOGLOBIN (HGB A1C): Hemoglobin A1C: 8.5 % — AB (ref 4.0–5.6)

## 2020-11-16 MED ORDER — TRESIBA FLEXTOUCH 200 UNIT/ML ~~LOC~~ SOPN: 150.0000 [IU] | PEN_INJECTOR | Freq: Every day | SUBCUTANEOUS | 3 refills | Status: DC

## 2020-11-16 MED ORDER — OZEMPIC (1 MG/DOSE) 2 MG/1.5ML ~~LOC~~ SOPN: 1.0000 mg | PEN_INJECTOR | SUBCUTANEOUS | 3 refills | Status: DC

## 2020-11-16 NOTE — Progress Notes (Signed)
Subjective:    Patient ID: [name], female    DOB: [birth year], 50 y.o.   MRN: [id number]  HPI Pt returns for f/u of diabetes mellitus: DM type: 1 Dx'ed: 0240 Complications: stage 3 CRI and PN Therapy: insulin since 2012, and Ozempic.   GDM: never DKA: once, in 2020 Severe hypoglycemia: never.   Pancreatitis: never.   Other: she declines weight loss surgery; edema precludes pioglitizone rx; she did not tolerate metformin (diarrhea); she is not a candidate for multiple daily injections, due to missing insulin doses.    Interval history: Pt says she now seldom misses meds.  Pt states cbg varies from 99-349.   It is in general lowest fasting.  pt states she feels well in general.  She says the Ozempic helps, and she would like to continue. Past Medical History:  Diagnosis Date  . Allergy   . Anxiety   . Arthritis   . Asthma   . Chronic back pain   . Chronic chest pain    resolved, no problems since 2019 per patient 07/27/19  . COPD (chronic obstructive pulmonary disease) ([place])   . Depression   . DM (diabetes mellitus) ([place])    INSULIN DEPENDENT - TYPE 1  . GERD (gastroesophageal reflux disease)   . Headache(784.0)   . Hyperlipidemia    no med, diet controlled  . Hypertension   . Hypokalemia   . Respiratory disease 05/2017   uses inhalers, neb tx prn, no oxygen  . Sleep apnea    does not use CPAP due to trach  . Tracheostomy in place [facility]) 02/2017    Past Surgical History:  Procedure Laterality Date  . ABDOMINAL HYSTERECTOMY  2009  . APPENDECTOMY    . CESAREAN SECTION     x 3  . CHOLECYSTECTOMY N/A 03/02/2014   Procedure: LAPAROSCOPIC CHOLECYSTECTOMY;  Surgeon: Joyice Faster. Cornett, MD;  Location: [place];  Service: General;  Laterality: N/A;  . HERNIA REPAIR    . PANENDOSCOPY N/A 03/04/2017   Procedure: PANENDOSCOPY WITH POSSIBLE FOREIGN BODY REMOVAL;  Surgeon: Jodi Marble, MD;  Location: [place];  Service: ENT;  Laterality: N/A;  . ROTATOR CUFF  REPAIR Left   . ROTATOR CUFF REPAIR Right 07/27/2019  . SHOULDER ARTHROSCOPY WITH SUBACROMIAL DECOMPRESSION, ROTATOR CUFF REPAIR AND BICEP TENDON REPAIR Right 07/28/2019   Procedure: RIGHT SHOULDER ARTHROSCOP, MINI OPEN ROTATOR CUFF TEAR REPAIR,  BICEPS TENODESIS, DISTAL CLAVICLE EXCISION;  Surgeon: Meredith Pel, MD;  Location: [place];  Service: Orthopedics;  Laterality: Right;  . TRACHEOSTOMY  02/2017  . VESICOVAGINAL FISTULA CLOSURE W/ TAH  2009    Social History   Socioeconomic History  . Marital status: Married    Spouse name: Journalist, newspaper  . Number of children: Not on file  . Years of education: Not on file  . Highest education level: Not on file  Occupational History  . Not on file  Tobacco Use  . Smoking status: Former Smoker    Packs/day: 0.25    Years: 22.00    Pack years: 5.50    Types: Cigarettes    Quit date: 01/20/2017    Years since quitting: 3.8  . Smokeless tobacco: Never Used  Vaping Use  . Vaping Use: Never used  Substance and Sexual Activity  . Alcohol use: Yes    Alcohol/week: 0.0 standard drinks    Comment: social wine  . Drug use: Yes    Types: Marijuana    Comment: Last Use In July 2021  .  Sexual activity: Yes    Partners: Male    Birth control/protection: Surgical    Comment: Hysterectomy  Other Topics Concern  . Not on file  Social History Narrative  . Not on file   Social Determinants of Health   Financial Resource Strain: Not on file  Food Insecurity: No Food Insecurity  . Worried About Charity fundraiser in the Last Year: Never true  . Ran Out of Food in the Last Year: Never true  Transportation Needs: No Transportation Needs  . Lack of Transportation (Medical): No  . Lack of Transportation (Non-Medical): No  Physical Activity: Not on file  Stress: Not on file  Social Connections: Not on file  Intimate Partner Violence: Not on file    Current Outpatient Medications on File Prior to Visit  Medication Sig Dispense Refill  .  albuterol (PROAIR HFA) 108 (90 Base) MCG/ACT inhaler Inhale 2 puffs into the lungs every 6 (six) hours as needed for wheezing or shortness of breath. 18 g 6  . albuterol (PROVENTIL) (2.5 MG/3ML) 0.083% nebulizer solution Take 3 mLs (2.5 mg total) by nebulization every 6 (six) hours as needed for wheezing or shortness of breath. 75 mL 3  . atorvastatin (LIPITOR) 20 MG tablet Take 1 tablet (20 mg total) by mouth daily. 90 tablet 1  . benzonatate (TESSALON) 200 MG capsule Take 1 capsule (200 mg total) by mouth 2 (two) times daily as needed for cough. 20 capsule 0  . Blood Glucose Monitoring Suppl (ONETOUCH VERIO) w/Device KIT 1 each by Does not apply route daily. Use to monitor glucose levels daily; E11.9 1 kit 0  . buPROPion (WELLBUTRIN SR) 150 MG 12 hr tablet Take 1 tablet (150 mg total) by mouth 2 (two) times daily. 180 tablet 1  . furosemide (LASIX) 20 MG tablet Take 1 tablet (20 mg total) by mouth daily. 30 tablet 2  . gabapentin (NEURONTIN) 300 MG capsule Take 2 capsules (600 mg total) by mouth 2 (two) times daily. (Patient taking differently: Take 300 mg by mouth See admin instructions. 1 capsule in the am, 1 capsule in the afternoon, 2 caps at bedtime) 360 capsule 1  . glucose blood (ONETOUCH VERIO) test strip Use to monitor glucose levels daily; E11.9 100 each 2  . hydrOXYzine (ATARAX/VISTARIL) 25 MG tablet Take 1 tablet (25 mg total) by mouth 3 (three) times daily as needed for anxiety. 180 tablet 1  . Insulin Pen Needle 31G X 8 MM MISC Inject 1 Units as directed daily.    . Melatonin 3 MG TABS Take 3 mg by mouth at bedtime as needed (for sleep).     . Misc. Devices MISC Nebulizer device. Diagnosis - Asthma 1 each 0  . mometasone-formoterol (DULERA) 200-5 MCG/ACT AERO Inhale 2 puffs into the lungs in the morning and at bedtime. 1 each 6  . montelukast (SINGULAIR) 10 MG tablet Take 1 tablet (10 mg total) by mouth at bedtime. 90 tablet 1  . ondansetron (ZOFRAN) 4 MG tablet Take 1 tablet (4 mg  total) by mouth every 8 (eight) hours as needed for nausea or vomiting. 30 tablet 0  . OneTouch Delica Lancets 70Y MISC 1 each by Does not apply route daily. Use to monitor glucose levels daily; E11.9 100 each 2  . oxyCODONE-acetaminophen (PERCOCET) 10-325 MG tablet Take 1 tablet by mouth 4 (four) times daily as needed for pain.     . pantoprazole (PROTONIX) 40 MG tablet Take 1 tablet (40 mg total) by mouth  2 (two) times daily. 60 tablet 0  . sertraline (ZOLOFT) 100 MG tablet Take 1 tablet (100 mg total) by mouth at bedtime. 90 tablet 1  . SUMAtriptan (IMITREX) 100 MG tablet At the onset of a migraine; may repeat in 2 hours. Max daily dose 281m (Patient taking differently: Take 100 mg by mouth every 2 (two) hours as needed for migraine.) 20 tablet 6  . tiZANidine (ZANAFLEX) 4 MG tablet Take 4 mg by mouth every 8 (eight) hours as needed for muscle spasms.    .Marland Kitchentopiramate (TOPAMAX) 100 MG tablet Take 1 tablet (100 mg total) by mouth 2 (two) times daily. 180 tablet 1  . acetaminophen (TYLENOL) 325 MG tablet Take 1-2 tablets (325-650 mg total) by mouth every 6 (six) hours as needed for mild pain (pain score 1-3 or temp > 100.5). 60 tablet 0  . polyethylene glycol (MIRALAX) 17 g packet Take 17 g by mouth daily. 14 each 0  . [DISCONTINUED] fluticasone-salmeterol (ADVAIR HFA) 230-21 MCG/ACT inhaler Inhale 2 puffs into the lungs 2 (two) times daily. 1 Inhaler 3   No current facility-administered medications on file prior to visit.    Allergies  Allergen Reactions  . Reglan [Metoclopramide] Other (See Comments)    Panic attack    Family History  Problem Relation Age of Onset  . Heart attack Mother   . Stroke Mother   . Diabetes Mother   . Hypertension Mother   . Arthritis Mother   . Stroke Father   . Hypertension Sister   . Diabetes Sister   . Seizures Brother   . Diabetes Brother   . Colon cancer Neg Hx   . Rectal cancer Neg Hx   . Stomach cancer Neg Hx     BP (!) 170/104 (BP Location:  Right Arm, Patient Position: Sitting, Cuff Size: Large)   Pulse (!) 108   Ht 5' 1"  (1.549 m)   Wt 234 lb 12.8 oz (106.5 kg)   LMP  (LMP Unknown)   SpO2 97%   BMI 44.37 kg/m    Review of Systems She denies hypoglycemia and n/v    Objective:   Physical Exam VITAL SIGNS:  See vs page GENERAL: no distress Pulses: dorsalis pedis intact bilat.   MSK: no deformity of the feet CV: trace bilat leg edema.   Skin:  no ulcer on the feet.  normal color and temp on the feet. Neuro: sensation is intact to touch on the feet, but decreased from normal  Lab Results  Component Value Date   HGBA1C 8.5 (A) 11/16/2020      Assessment & Plan:  Type 1 DM, with stage 3 CRI: uncontrolled  Patient Instructions  I have sent a prescription to your pharmacy, to increase the Tresiba to 150 units per day. Please continue the same Ozempic check your blood sugar twice a day.  vary the time of day when you check, between before the 3 meals, and at bedtime.  also check if you have symptoms of your blood sugar being too high or too low.  please keep a record of the readings and bring it to your next appointment here (or you can bring the meter itself).  You can write it on any piece of paper.  please call [email or web address] if your blood sugar goes below 70, or if you have a lot of readings over 200. Please come back for a follow-up appointment in 4 months.

## 2020-11-16 NOTE — Patient Instructions (Addendum)
I have sent a prescription to your pharmacy, to increase the Tresiba to 150 units per day. Please continue the same Ozempic check your blood sugar twice a day.  vary the time of day when you check, between before the 3 meals, and at bedtime.  also check if you have symptoms of your blood sugar being too high or too low.  please keep a record of the readings and bring it to your next appointment here (or you can bring the meter itself).  You can write it on any piece of paper.  please call us sooner if your blood sugar goes below 70, or if you have a lot of readings over 200. Please come back for a follow-up appointment in 4 months.

## 2020-11-19 ENCOUNTER — Ambulatory Visit (INDEPENDENT_AMBULATORY_CARE_PROVIDER_SITE_OTHER): Payer: 59 | Admitting: Orthopedic Surgery

## 2020-11-19 DIAGNOSIS — R531 Weakness: Secondary | ICD-10-CM

## 2020-11-21 ENCOUNTER — Encounter: Payer: Self-pay | Admitting: Orthopedic Surgery

## 2020-11-21 NOTE — Progress Notes (Signed)
Office Visit Note   Patient: [name]           Date of Birth: 12-15-1970           MRN: [id number] Visit Date: 11/19/2020 Requested by: [name], MD [place],  [place] [id number] PCP: [name], MD  Subjective: Chief Complaint  Patient presents with  . Other    Neck pain, upper/mid back pain, low back pain    HPI: [name] is a patient who underwent right shoulder surgery last year.  Doing reasonably well from that but now she reports significant neck thoracic and lumbar spine pain with no history of injury.  Describes constant pain.  Has been going on for months but she has been trying to live with it and deal with it.  Reports some radiating pain in the right arm but also significant weakness in the right leg which is causing her to fall.  She has had 3 falls within the last month.  She did have an MRI scan of her lumbar spine in May 2021 which showed left-sided L4-5 disc extrusion.  Reports right shoulder scapular pain radiating down the back.  Typically her standing endurance is around 15 to 20 minutes but now she can only stand about 3 or 4 minutes.  She is in pain management and the pain pills to help with this is a change in her clinical condition.  She does report bilateral leg numbness and tingling and weakness in that right leg.              ROS: All systems reviewed are negative as they relate to the chief complaint within the history of present illness.  Patient denies  fevers or chills.   Assessment & Plan: Visit Diagnoses:  1. Weakness     Plan: Impression is axial spine pain with some radicular symptoms in the right arm as well as radicular symptoms which are new in the right leg with associated objective weakness.  Patient is having falls.  She had plain radiographs done at [facility] pain management which by report were normal of her axial spine.  Plan MRI C-spine T-spine L-spine to evaluate bilateral lower extremity  paresthesias as well as right hip flexor and quad weakness and right arm radicular paresthesias.  See her back after that study.  Follow-Up Instructions: Return for after MRI.   Orders:  Orders Placed This Encounter  Procedures  . MR Cervical Spine w/o contrast  . MR Thoracic Spine w/o contrast  . MR Lumbar Spine w/o contrast   No orders of the defined types were placed in this encounter.     Procedures: No procedures performed   Clinical Data: No additional findings.  Objective: Vital Signs: LMP  (LMP Unknown)   Physical Exam:   Constitutional: Patient appears well-developed HEENT:  Head: Normocephalic Eyes:EOM are normal Neck: Normal range of motion Cardiovascular: Normal rate Pulmonary/chest: Effort normal Neurologic: Patient is alert Skin: Skin is warm Psychiatric: Patient has normal mood and affect    Ortho Exam: Ortho exam demonstrates ankle dorsiflexion 5 out of 5 bilaterally.  Quad strength 5- out of 5 on the right 5 out of 5 on the left.  Hip flexion strength is about 3- out of 5 on the right compared to 5 facet of 5 on the left.  Does have some mild paresthesias in the L3-4 distribution right versus left.  No groin hip or knee pain with passive range of motion of the joints.  Abduction strength also less on the right compared to the left.  Patient has pain with forward lateral bending.  She has 5 out of 5 grip EPL FPL interosseous wrist flexion extension bicep triceps and deltoid strength.  Pretty reasonable shoulder range of motion as well.  Neck range of motion flexion chin to chest extension is about 30 degrees rotation is about 40 degrees bilaterally.  Reflexes generally symmetric 1+ out of 4 bilateral patella Achilles biceps and triceps.  No definite discrete paresthesias right arm versus left arm.  Specialty Comments:  No specialty comments available.  Imaging: No results found.   PMFS History: Patient Active Problem List   Diagnosis Date Noted  .  COVID-19 09/16/2020  . Dyspnea 08/26/2020  . Rotator cuff tear 07/28/2019  . Diabetes ([place]) 04/12/2019  . Chest pain of uncertain etiology 30/16/0109  . Lactic acidosis 03/22/2019  . Chronic right shoulder pain   . Community acquired pneumonia 10/25/2018  . Asthma, chronic, unspecified asthma severity, with acute exacerbation 10/25/2018  . Acute respiratory failure with hypoxia and hypercapnia ([place]) 10/23/2018  . Acute pain of right shoulder due to trauma 10/05/2018  . Chronic bilateral low back pain without sciatica 10/05/2018  . Dysphonia 10/05/2018  . Severe asthma with exacerbation 10/05/2018  . Rotator cuff arthropathy, right 06/15/2018  . AKI (acute kidney injury) ([place]) 03/06/2018  . Carpal tunnel syndrome 01/18/2018  . Heart failure ([place]) 05/20/2017  . Normocytic anemia 05/06/2017  . Tracheostomy dependent ([place]) 03/26/2017  . Dysphagia 03/26/2017  . Status post tracheostomy ([place]) 03/14/2017  . Klebsiella cystitis 03/14/2017  . Morbid obesity ([place]) 03/13/2017  . Subglottic stenosis 03/06/2017  . Asthma 03/06/2017  . Chronic diastolic heart failure ([place]) 03/06/2017  . Tracheal stenosis 03/04/2017  . Acute blood loss anemia   . Generalized anxiety disorder   . Chronic pain syndrome   . GERD (gastroesophageal reflux disease) 02/22/2016  . Depression 02/22/2016  . Migraine 01/30/2015  . Bulging lumbar disc 09/05/2013  . Essential hypertension 09/05/2013  . Allergic rhinitis, seasonal 08/11/2012  . Chronic cough 08/11/2012  . OSA (obstructive sleep apnea) 12/04/2011   Past Medical History:  Diagnosis Date  . Allergy   . Anxiety   . Arthritis   . Asthma   . Chronic back pain   . Chronic chest pain    resolved, no problems since 2019 per patient 07/27/19  . COPD (chronic obstructive pulmonary disease) ([place])   . Depression   . DM (diabetes mellitus) ([place])    INSULIN DEPENDENT - TYPE 1  . GERD (gastroesophageal reflux disease)   . Headache(784.0)   . Hyperlipidemia     no med, diet controlled  . Hypertension   . Hypokalemia   . Respiratory disease 05/2017   uses inhalers, neb tx prn, no oxygen  . Sleep apnea    does not use CPAP due to trach  . Tracheostomy in place [facility]) 02/2017    Family History  Problem Relation Age of Onset  . Heart attack Mother   . Stroke Mother   . Diabetes Mother   . Hypertension Mother   . Arthritis Mother   . Stroke Father   . Hypertension Sister   . Diabetes Sister   . Seizures Brother   . Diabetes Brother   . Colon cancer Neg Hx   . Rectal cancer Neg Hx   . Stomach cancer Neg Hx     Past Surgical History:  Procedure Laterality Date  . ABDOMINAL HYSTERECTOMY  2009  .  APPENDECTOMY    . CESAREAN SECTION     x 3  . CHOLECYSTECTOMY N/A 03/02/2014   Procedure: LAPAROSCOPIC CHOLECYSTECTOMY;  Surgeon: Joyice Faster. Cornett, MD;  Location: [place];  Service: General;  Laterality: N/A;  . HERNIA REPAIR    . PANENDOSCOPY N/A 03/04/2017   Procedure: PANENDOSCOPY WITH POSSIBLE FOREIGN BODY REMOVAL;  Surgeon: Jodi Marble, MD;  Location: [place];  Service: ENT;  Laterality: N/A;  . ROTATOR CUFF REPAIR Left   . ROTATOR CUFF REPAIR Right 07/27/2019  . SHOULDER ARTHROSCOPY WITH SUBACROMIAL DECOMPRESSION, ROTATOR CUFF REPAIR AND BICEP TENDON REPAIR Right 07/28/2019   Procedure: RIGHT SHOULDER ARTHROSCOP, MINI OPEN ROTATOR CUFF TEAR REPAIR,  BICEPS TENODESIS, DISTAL CLAVICLE EXCISION;  Surgeon: Meredith Pel, MD;  Location: [place];  Service: Orthopedics;  Laterality: Right;  . TRACHEOSTOMY  02/2017  . VESICOVAGINAL FISTULA CLOSURE W/ TAH  2009   Social History   Occupational History  . Not on file  Tobacco Use  . Smoking status: Former Smoker    Packs/day: 0.25    Years: 22.00    Pack years: 5.50    Types: Cigarettes    Quit date: 01/20/2017    Years since quitting: 3.8  . Smokeless tobacco: Never Used  Vaping Use  . Vaping Use: Never used  Substance and Sexual Activity  . Alcohol use: Yes    Alcohol/week: 0.0  standard drinks    Comment: social wine  . Drug use: Yes    Types: Marijuana    Comment: Last Use In July 2021  . Sexual activity: Yes    Partners: Male    Birth control/protection: Surgical    Comment: Hysterectomy

## 2020-11-27 ENCOUNTER — Ambulatory Visit: Payer: 59 | Attending: Family Medicine | Admitting: Family Medicine

## 2020-11-27 ENCOUNTER — Other Ambulatory Visit: Payer: Self-pay

## 2020-11-27 DIAGNOSIS — Z1159 Encounter for screening for other viral diseases: Secondary | ICD-10-CM | POA: Diagnosis not present

## 2020-11-27 DIAGNOSIS — J4540 Moderate persistent asthma, uncomplicated: Secondary | ICD-10-CM | POA: Diagnosis not present

## 2020-11-27 DIAGNOSIS — E1069 Type 1 diabetes mellitus with other specified complication: Secondary | ICD-10-CM

## 2020-11-27 DIAGNOSIS — G43119 Migraine with aura, intractable, without status migrainosus: Secondary | ICD-10-CM

## 2020-11-27 DIAGNOSIS — E785 Hyperlipidemia, unspecified: Secondary | ICD-10-CM

## 2020-11-27 DIAGNOSIS — E109 Type 1 diabetes mellitus without complications: Secondary | ICD-10-CM

## 2020-11-27 DIAGNOSIS — M25561 Pain in right knee: Secondary | ICD-10-CM

## 2020-11-27 DIAGNOSIS — F330 Major depressive disorder, recurrent, mild: Secondary | ICD-10-CM

## 2020-11-27 DIAGNOSIS — M940 Chondrocostal junction syndrome [Tietze]: Secondary | ICD-10-CM

## 2020-11-27 MED ORDER — ATORVASTATIN CALCIUM 20 MG PO TABS: 20.0000 mg | ORAL_TABLET | Freq: Every day | ORAL | 1 refills | Status: DC

## 2020-11-27 MED ORDER — SERTRALINE HCL 100 MG PO TABS: 100.0000 mg | ORAL_TABLET | Freq: Every day | ORAL | 1 refills | Status: DC

## 2020-11-27 MED ORDER — ALBUTEROL SULFATE (2.5 MG/3ML) 0.083% IN NEBU: 2.5000 mg | INHALATION_SOLUTION | Freq: Four times a day (QID) | RESPIRATORY_TRACT | 3 refills | Status: DC | PRN

## 2020-11-27 MED ORDER — ALBUTEROL SULFATE HFA 108 (90 BASE) MCG/ACT IN AERS: 2.0000 | INHALATION_SPRAY | Freq: Four times a day (QID) | RESPIRATORY_TRACT | 6 refills | Status: DC | PRN

## 2020-11-27 MED ORDER — TOPIRAMATE 100 MG PO TABS: 100.0000 mg | ORAL_TABLET | Freq: Two times a day (BID) | ORAL | 1 refills | Status: DC

## 2020-11-27 MED ORDER — SUMATRIPTAN SUCCINATE 100 MG PO TABS: ORAL_TABLET | ORAL | 6 refills | Status: DC

## 2020-11-27 MED ORDER — HYDROXYZINE HCL 25 MG PO TABS: 25.0000 mg | ORAL_TABLET | Freq: Three times a day (TID) | ORAL | 1 refills | Status: DC | PRN

## 2020-11-27 MED ORDER — DICLOFENAC SODIUM 1 % EX GEL: 4.0000 g | Freq: Four times a day (QID) | CUTANEOUS | 1 refills | Status: DC

## 2020-11-27 MED ORDER — BUPROPION HCL ER (SR) 150 MG PO TB12: 150.0000 mg | ORAL_TABLET | Freq: Two times a day (BID) | ORAL | 1 refills | Status: DC

## 2020-11-27 NOTE — Progress Notes (Signed)
Virtual Visit via Video Note  I connected with [name], on 11/27/2020 at 2:32 PM by video enabled telemedicine device due to the COVID-19 pandemic and verified that I am speaking with the correct person using two identifiers.   Consent: I discussed the limitations, risks, security and privacy concerns of performing an evaluation and management service by telemedicine and the availability of in person appointments. I also discussed with the patient that there may be a patient responsible charge related to this service. The patient expressed understanding and agreed to proceed.   Location of Patient: Home  Location of Provider: Clinic   Persons participating in Telemedicine visit: [name] Dr. Margarita Rana     History of Present Illness: [name] is a57 year old female with a history of type1diabetes mellitus (A1c8.5), hypertension, obstructive sleep apnea, asthma, depression, s/p tracheostomy secondary to intubation related tracheal injury, subglottic stenosis (status post balloon dilatation and Kenalog injection of stenosis tracheostomy tube exchange by ENT,status post right rotator cuff surgery in 07/2019 seen for follow-up visit.   Her R leg gave out on her yesterday and she fell and R knee is swollen.  She has had right knee problems in the remote past.  She has been applying ice to weight and has difficulty ambulating. Currently under the care of orthopedics, Dr. Marlou Sa for chronic pain in her entire spine and is being scheduled for an MRI.  Also being followed by pain management. She also complains of pain and mild swelling on the right side of her chest wall which has been present for the last couple of weeks.  Denies history of trauma to her right chest wall.  She informs me her asthma is not controlled and she finds herself wheezing and this has been ever since she got her tracheostomy tube  placed.  Prolonged speaking also causes pain at the site of her tracheostomy tube. Her migraines have not been controlled either and on further questioning she has been administering Topamax as needed. With regards to her diabetes mellitus her last A1c was 8.5 last month and her regimen was adjusted by her endocrinologist last month. Past Medical History:  Diagnosis Date  . Allergy   . Anxiety   . Arthritis   . Asthma   . Chronic back pain   . Chronic chest pain    resolved, no problems since 2019 per patient 07/27/19  . COPD (chronic obstructive pulmonary disease) ([place])   . Depression   . DM (diabetes mellitus) ([place])    INSULIN DEPENDENT - TYPE 1  . GERD (gastroesophageal reflux disease)   . Headache(784.0)   . Hyperlipidemia    no med, diet controlled  . Hypertension   . Hypokalemia   . Respiratory disease 05/2017   uses inhalers, neb tx prn, no oxygen  . Sleep apnea    does not use CPAP due to trach  . Tracheostomy in place [facility]) 02/2017   Allergies  Allergen Reactions  . Reglan [Metoclopramide] Other (See Comments)    Panic attack    Current Outpatient Medications on File Prior to Visit  Medication Sig Dispense Refill  . albuterol (PROAIR HFA) 108 (90 Base) MCG/ACT inhaler Inhale 2 puffs into the lungs every 6 (six) hours as needed for wheezing or shortness of breath. 18 g 6  . albuterol (PROVENTIL) (2.5 MG/3ML) 0.083% nebulizer solution Take 3 mLs (2.5 mg total) by nebulization every 6 (six) hours as needed for wheezing or shortness of breath. 75 mL  3  . atorvastatin (LIPITOR) 20 MG tablet Take 1 tablet (20 mg total) by mouth daily. 90 tablet 1  . benzonatate (TESSALON) 200 MG capsule Take 1 capsule (200 mg total) by mouth 2 (two) times daily as needed for cough. 20 capsule 0  . Blood Glucose Monitoring Suppl (ONETOUCH VERIO) w/Device KIT 1 each by Does not apply route daily. Use to monitor glucose levels daily; E11.9 1 kit 0  . buPROPion (WELLBUTRIN SR) 150 MG 12 hr  tablet Take 1 tablet (150 mg total) by mouth 2 (two) times daily. 180 tablet 1  . furosemide (LASIX) 20 MG tablet Take 1 tablet (20 mg total) by mouth daily. 30 tablet 2  . gabapentin (NEURONTIN) 300 MG capsule Take 2 capsules (600 mg total) by mouth 2 (two) times daily. (Patient taking differently: Take 300 mg by mouth See admin instructions. 1 capsule in the am, 1 capsule in the afternoon, 2 caps at bedtime) 360 capsule 1  . glucose blood (ONETOUCH VERIO) test strip Use to monitor glucose levels daily; E11.9 100 each 2  . hydrOXYzine (ATARAX/VISTARIL) 25 MG tablet Take 1 tablet (25 mg total) by mouth 3 (three) times daily as needed for anxiety. 180 tablet 1  . insulin degludec (TRESIBA FLEXTOUCH) 200 UNIT/ML FlexTouch Pen Inject 150 Units into the skin daily. 24 mL 3  . Insulin Pen Needle 31G X 8 MM MISC Inject 1 Units as directed daily.    . Melatonin 3 MG TABS Take 3 mg by mouth at bedtime as needed (for sleep).     . Misc. Devices MISC Nebulizer device. Diagnosis - Asthma 1 each 0  . mometasone-formoterol (DULERA) 200-5 MCG/ACT AERO Inhale 2 puffs into the lungs in the morning and at bedtime. 1 each 6  . montelukast (SINGULAIR) 10 MG tablet Take 1 tablet (10 mg total) by mouth at bedtime. 90 tablet 1  . ondansetron (ZOFRAN) 4 MG tablet Take 1 tablet (4 mg total) by mouth every 8 (eight) hours as needed for nausea or vomiting. 30 tablet 0  . OneTouch Delica Lancets 86N MISC 1 each by Does not apply route daily. Use to monitor glucose levels daily; E11.9 100 each 2  . oxyCODONE-acetaminophen (PERCOCET) 10-325 MG tablet Take 1 tablet by mouth 4 (four) times daily as needed for pain.     . pantoprazole (PROTONIX) 40 MG tablet Take 1 tablet (40 mg total) by mouth 2 (two) times daily. 60 tablet 0  . Semaglutide, 1 MG/DOSE, (OZEMPIC, 1 MG/DOSE,) 2 MG/1.5ML SOPN Inject 1 mg into the skin once a week. 9 mL 3  . sertraline (ZOLOFT) 100 MG tablet Take 1 tablet (100 mg total) by mouth at bedtime. 90 tablet  1  . SUMAtriptan (IMITREX) 100 MG tablet At the onset of a migraine; may repeat in 2 hours. Max daily dose 262m (Patient taking differently: Take 100 mg by mouth every 2 (two) hours as needed for migraine.) 20 tablet 6  . tiZANidine (ZANAFLEX) 4 MG tablet Take 4 mg by mouth every 8 (eight) hours as needed for muscle spasms.    .Marland Kitchentopiramate (TOPAMAX) 100 MG tablet Take 1 tablet (100 mg total) by mouth 2 (two) times daily. 180 tablet 1  . acetaminophen (TYLENOL) 325 MG tablet Take 1-2 tablets (325-650 mg total) by mouth every 6 (six) hours as needed for mild pain (pain score 1-3 or temp > 100.5). 60 tablet 0  . polyethylene glycol (MIRALAX) 17 g packet Take 17 g by mouth daily. 1[place]  each 0  . [DISCONTINUED] fluticasone-salmeterol (ADVAIR HFA) 230-21 MCG/ACT inhaler Inhale 2 puffs into the lungs 2 (two) times daily. 1 Inhaler 3   No current facility-administered medications on file prior to visit.    ROS: See HPI  Observations/Objective: Awake, alert, oriented x3 Not in acute distress Chest-no evidence of edema on chest wall; reproducible chest pain on deep palpation Right knee-slight edema, no erythema Psych-normal mood  Lab Results  Component Value Date   HGBA1C 8.5 (A) 11/16/2020    Lipid Panel     Component Value Date/Time   CHOL 165 06/15/2018 1031   TRIG 124 06/15/2018 1031   HDL 51 06/15/2018 1031   CHOLHDL 3.2 06/15/2018 1031   CHOLHDL 3.9 02/22/2016 0954   VLDL 26 02/22/2016 0954   LDLCALC 89 06/15/2018 1031   LABVLDL 25 06/15/2018 1031    Assessment and Plan: 1. Moderate persistent asthma without complication Uncontrolled We will refer to pulmonary as she might be a candidate for Dupixent - Ambulatory referral to Pulmonology - albuterol (PROAIR HFA) 108 (90 Base) MCG/ACT inhaler; Inhale 2 puffs into the lungs every 6 (six) hours as needed for wheezing or shortness of breath.  Dispense: 18 g; Refill: 6 - albuterol (PROVENTIL) (2.5 MG/3ML) 0.083% nebulizer solution;  Take 3 mLs (2.5 mg total) by nebulization every 6 (six) hours as needed for wheezing or shortness of breath.  Dispense: 75 mL; Refill: 3  2. Hyperlipidemia due to type 1 diabetes mellitus ([place]) Due for lipid panel We will have her come in when she is fasting for labs Continue statin - atorvastatin (LIPITOR) 20 MG tablet; Take 1 tablet (20 mg total) by mouth daily.  Dispense: 90 tablet; Refill: 1  3. Mild episode of recurrent major depressive disorder (HCC) Controlled - buPROPion (WELLBUTRIN SR) 150 MG 12 hr tablet; Take 1 tablet (150 mg total) by mouth 2 (two) times daily.  Dispense: 180 tablet; Refill: 1 - sertraline (ZOLOFT) 100 MG tablet; Take 1 tablet (100 mg total) by mouth at bedtime.  Dispense: 90 tablet; Refill: 1 - hydrOXYzine (ATARAX/VISTARIL) 25 MG tablet; Take 1 tablet (25 mg total) by mouth 3 (three) times daily as needed for anxiety.  Dispense: 180 tablet; Refill: 1  4. Need for hepatitis C screening test - HCV RNA quant rflx ultra or genotyp([facility]); Future  5. Type 1 diabetes mellitus without complication (HCC) Uncontrolled with A1c of 8.5 Counseled on Diabetic diet, my plate method, 824 minutes of moderate intensity exercise/week Blood sugar logs with fasting goals of 80-120 mg/dl, random of less than 180 and in the event of sugars less than 60 mg/dl or greater than 400 mg/dl encouraged to notify the clinic. Advised on the need for annual eye exams, annual foot exams, Pneumonia vaccine. Medication management per endocrinologist - Microalbumin / creatinine urine ratio; Future  6. Costochondritis Uncontrolled Advised to apply Voltaren gel  7. Acute pain of right knee Secondary to trauma Apply ice, rest, elevate knee We will use Voltaren gel If symptoms persist after 1 week consider imaging  8. Intractable migraine with aura without status migrainosus Uncontrolled due to improper medication administration Advised of the prophylactic role of Topamax. -  topiramate (TOPAMAX) 100 MG tablet; Take 1 tablet (100 mg total) by mouth 2 (two) times daily.  Dispense: 180 tablet; Refill: 1 - SUMAtriptan (IMITREX) 100 MG tablet; At the onset of a migraine; may repeat in 2 hours. Max daily dose 225m  Dispense: 20 tablet; Refill: 6   Follow Up Instructions: 3 months  for chronic disease management   I discussed the assessment and treatment plan with the patient. The patient was provided an opportunity to ask questions and all were answered. The patient agreed with the plan and demonstrated an understanding of the instructions.   The patient was advised to call back or seek an in-person evaluation if the symptoms worsen or if the condition fails to improve as anticipated.     I provided 18 minutes total of Telehealth time during this encounter including median intraservice time, reviewing previous notes, investigations, ordering medications, medical decision making, coordinating care and patient verbalized understanding at the end of the visit.     [name], MD, FAAFP. [facility], [place]   11/27/2020, 2:32 PM

## 2020-11-27 NOTE — Progress Notes (Signed)
Had a fall and having pain right knee and back.

## 2020-11-28 ENCOUNTER — Encounter: Payer: Self-pay | Admitting: Gastroenterology

## 2020-11-28 ENCOUNTER — Other Ambulatory Visit: Payer: Self-pay

## 2020-11-28 ENCOUNTER — Ambulatory Visit (INDEPENDENT_AMBULATORY_CARE_PROVIDER_SITE_OTHER): Payer: 59 | Admitting: Gastroenterology

## 2020-11-28 ENCOUNTER — Ambulatory Visit: Payer: 59 | Attending: Family Medicine

## 2020-11-28 VITALS — BP 140/90 | HR 105 | Ht 61.0 in | Wt 232.0 lb

## 2020-11-28 DIAGNOSIS — R1013 Epigastric pain: Secondary | ICD-10-CM

## 2020-11-28 DIAGNOSIS — Z1159 Encounter for screening for other viral diseases: Secondary | ICD-10-CM

## 2020-11-28 DIAGNOSIS — R1010 Upper abdominal pain, unspecified: Secondary | ICD-10-CM | POA: Insufficient documentation

## 2020-11-28 DIAGNOSIS — R1110 Vomiting, unspecified: Secondary | ICD-10-CM | POA: Insufficient documentation

## 2020-11-28 DIAGNOSIS — Z794 Long term (current) use of insulin: Secondary | ICD-10-CM | POA: Diagnosis not present

## 2020-11-28 DIAGNOSIS — E119 Type 2 diabetes mellitus without complications: Secondary | ICD-10-CM | POA: Diagnosis not present

## 2020-11-28 DIAGNOSIS — R112 Nausea with vomiting, unspecified: Secondary | ICD-10-CM | POA: Diagnosis not present

## 2020-11-28 DIAGNOSIS — E109 Type 1 diabetes mellitus without complications: Secondary | ICD-10-CM

## 2020-11-28 MED ORDER — SUCRALFATE 1 G PO TABS: 1.0000 g | ORAL_TABLET | Freq: Three times a day (TID) | ORAL | 1 refills | Status: DC

## 2020-11-28 NOTE — Patient Instructions (Addendum)
If you are age 52 or older, your body mass index should be between 23-30. Your Body mass index is 43.84 kg/m. If this is out of the aforementioned range listed, please consider follow up with your Primary Care Provider.  If you are age 17 or younger, your body mass index should be between 19-25. Your Body mass index is 43.84 kg/m. If this is out of the aformentioned range listed, please consider follow up with your Primary Care Provider.   We have sent the following medications to your pharmacy for you to pick up at your convenience: Carafate 1 gm tablet before each meal and bedtime.   You have been scheduled for an endoscopy. Please follow written instructions given to you at your visit today. If you use inhalers (even only as needed), please bring them with you on the day of your procedure.  Due to recent changes in healthcare laws, you may see the results of your imaging and laboratory studies on MyChart before your provider has had a chance to review them.  We understand that in some cases there may be results that are confusing or concerning to you. Not all laboratory results come back in the same time frame and the provider may be waiting for multiple results in order to interpret others.  Please give [place] 48 hours in order for your provider to thoroughly review all the results before contacting the office for clarification of your results.

## 2020-11-28 NOTE — Progress Notes (Signed)
11/28/2020 [name] [id number] [birth year]   HISTORY OF PRESENT ILLNESS: This is a 50 year old female who is a patient of Dr. Blanch Media, known to him only for colonoscopy in 05/2020.  She is here today with complaints of severe epigastric abdominal pain that is worse with eating as well as nausea and vomiting.  She says that the symptoms of have been present for almost a year.  She complains of constant epigastric abdominal pain, but it worsens anytime she tries to eat or drink anything.  She says that it is very severe, sometimes obviously worse than others.  She also reports constant nausea and episodes of vomiting.  She is on pantoprazole 40 mg twice daily.  She has Zofran, which she uses regularly as well.  She also tries Pepto-Bismol and Alka-Seltzer to try to relieve her symptoms.  She is a longstanding diabetic on insulin.  She had a CT scan of the abdomen and pelvis with contrast in November 2021 that showed no cause of her symptoms.  Only showed moderate hepatic steatosis, stable since prior examination.  She also had some ovarian finding.    Past Medical History:  Diagnosis Date  . Allergy   . Anxiety   . Arthritis   . Asthma   . Chronic back pain   . Chronic chest pain    resolved, no problems since 2019 per patient 07/27/19  . COPD (chronic obstructive pulmonary disease) ([place])   . Depression   . DM (diabetes mellitus) ([place])    INSULIN DEPENDENT - TYPE 1  . GERD (gastroesophageal reflux disease)   . Headache(784.0)   . Hyperlipidemia    no med, diet controlled  . Hypertension   . Hypokalemia   . Respiratory disease 05/2017   uses inhalers, neb tx prn, no oxygen  . Sleep apnea    does not use CPAP due to trach  . Tracheostomy in place [facility]) 02/2017   Past Surgical History:  Procedure Laterality Date  . ABDOMINAL HYSTERECTOMY  2009  . APPENDECTOMY    . CESAREAN SECTION     x 3  . CHOLECYSTECTOMY N/A 03/02/2014   Procedure: LAPAROSCOPIC  CHOLECYSTECTOMY;  Surgeon: Joyice Faster. Cornett, MD;  Location: [place];  Service: General;  Laterality: N/A;  . HERNIA REPAIR    . PANENDOSCOPY N/A 03/04/2017   Procedure: PANENDOSCOPY WITH POSSIBLE FOREIGN BODY REMOVAL;  Surgeon: Jodi Marble, MD;  Location: [place];  Service: ENT;  Laterality: N/A;  . ROTATOR CUFF REPAIR Left   . ROTATOR CUFF REPAIR Right 07/27/2019  . SHOULDER ARTHROSCOPY WITH SUBACROMIAL DECOMPRESSION, ROTATOR CUFF REPAIR AND BICEP TENDON REPAIR Right 07/28/2019   Procedure: RIGHT SHOULDER ARTHROSCOP, MINI OPEN ROTATOR CUFF TEAR REPAIR,  BICEPS TENODESIS, DISTAL CLAVICLE EXCISION;  Surgeon: Meredith Pel, MD;  Location: [place];  Service: Orthopedics;  Laterality: Right;  . TRACHEOSTOMY  02/2017  . VESICOVAGINAL FISTULA CLOSURE W/ TAH  2009    reports that she quit smoking about 3 years ago. Her smoking use included cigarettes. She has a 5.50 pack-year smoking history. She has never used smokeless tobacco. She reports current alcohol use. She reports current drug use. Drug: Marijuana. family history includes Arthritis in her mother; Diabetes in her brother, mother, and sister; Heart attack in her mother; Hypertension in her mother and sister; Seizures in her brother; Stroke in her father and mother. Allergies  Allergen Reactions  . Reglan [Metoclopramide] Other (See Comments)    Panic attack  Outpatient Encounter Medications as of 11/28/2020  Medication Sig  . albuterol (PROAIR HFA) 108 (90 Base) MCG/ACT inhaler Inhale 2 puffs into the lungs every 6 (six) hours as needed for wheezing or shortness of breath.  Marland Kitchen albuterol (PROVENTIL) (2.5 MG/3ML) 0.083% nebulizer solution Take 3 mLs (2.5 mg total) by nebulization every 6 (six) hours as needed for wheezing or shortness of breath.  Marland Kitchen atorvastatin (LIPITOR) 20 MG tablet Take 1 tablet (20 mg total) by mouth daily.  . benzonatate (TESSALON) 200 MG capsule Take 1 capsule (200 mg total) by mouth 2 (two) times daily as needed for  cough.  . Blood Glucose Monitoring Suppl (ONETOUCH VERIO) w/Device KIT 1 each by Does not apply route daily. Use to monitor glucose levels daily; E11.9  . buPROPion (WELLBUTRIN SR) 150 MG 12 hr tablet Take 1 tablet (150 mg total) by mouth 2 (two) times daily.  . diclofenac Sodium (VOLTAREN) 1 % GEL Apply 4 g topically 4 (four) times daily.  . furosemide (LASIX) 20 MG tablet Take 1 tablet (20 mg total) by mouth daily.  [place] Kitchen gabapentin (NEURONTIN) 300 MG capsule Take 2 capsules (600 mg total) by mouth 2 (two) times daily. (Patient taking differently: Take 300 mg by mouth See admin instructions. 1 capsule in the am, 1 capsule in the afternoon, 2 caps at bedtime)  . glucose blood (ONETOUCH VERIO) test strip Use to monitor glucose levels daily; E11.9  . hydrOXYzine (ATARAX/VISTARIL) 25 MG tablet Take 1 tablet (25 mg total) by mouth 3 (three) times daily as needed for anxiety.  . insulin degludec (TRESIBA FLEXTOUCH) 200 UNIT/ML FlexTouch Pen Inject 150 Units into the skin daily.  . Insulin Pen Needle 31G X 8 MM MISC Inject 1 Units as directed daily.  . Melatonin 3 MG TABS Take 3 mg by mouth at bedtime as needed (for sleep).   . Misc. Devices MISC Nebulizer device. Diagnosis - Asthma  . mometasone-formoterol (DULERA) 200-5 MCG/ACT AERO Inhale 2 puffs into the lungs in the morning and at bedtime.  . montelukast (SINGULAIR) 10 MG tablet Take 1 tablet (10 mg total) by mouth at bedtime.  . ondansetron (ZOFRAN) 4 MG tablet Take 1 tablet (4 mg total) by mouth every 8 (eight) hours as needed for nausea or vomiting.  [place] Delica Lancets 23N MISC 1 each by Does not apply route daily. Use to monitor glucose levels daily; E11.9  . oxyCODONE-acetaminophen (PERCOCET) 10-325 MG tablet Take 1 tablet by mouth 4 (four) times daily as needed for pain.   . pantoprazole (PROTONIX) 40 MG tablet Take 1 tablet (40 mg total) by mouth 2 (two) times daily.  . Semaglutide, 1 MG/DOSE, (OZEMPIC, 1 MG/DOSE,) 2 MG/1.5ML SOPN Inject  1 mg into the skin once a week.  . sertraline (ZOLOFT) 100 MG tablet Take 1 tablet (100 mg total) by mouth at bedtime.  . SUMAtriptan (IMITREX) 100 MG tablet At the onset of a migraine; may repeat in 2 hours. Max daily dose 264m  . tiZANidine (ZANAFLEX) 4 MG tablet Take 4 mg by mouth every 8 (eight) hours as needed for muscle spasms.  .Marland Kitchentopiramate (TOPAMAX) 100 MG tablet Take 1 tablet (100 mg total) by mouth 2 (two) times daily.  .Marland Kitchenacetaminophen (TYLENOL) 325 MG tablet Take 1-2 tablets (325-650 mg total) by mouth every 6 (six) hours as needed for mild pain (pain score 1-3 or temp > 100.5).  .Marland Kitchenpolyethylene glycol (MIRALAX) 17 g packet Take 17 g by mouth daily.  . [DISCONTINUED] fluticasone-salmeterol (  ADVAIR HFA) 230-21 MCG/ACT inhaler Inhale 2 puffs into the lungs 2 (two) times daily.   No facility-administered encounter medications on file as of 11/28/2020.     REVIEW OF SYSTEMS  : All other systems reviewed and negative except where noted in the History of Present Illness.   PHYSICAL EXAM: BP 140/90   Pulse (!) 105   Ht _0  (1.549 m)   Wt 232 lb (105.2 kg)   LMP  (LMP Unknown)   BMI 43.84 kg/m  General: Well developed AA female in no acute distress Head: Normocephalic and atraumatic Eyes:  Sclerae anicteric, conjunctiva pink. Ears: Normal auditory acuity Lungs: Clear throughout to auscultation; some wheezing noted. Heart: Slightly tachy but regular rhythm; no M/R/G. Abdomen: Soft, non-distended.  BS present.  Upper abdominal TTP> in the epigastrium. Musculoskeletal: Symmetrical with no gross deformities  Skin: No lesions on visible extremities Extremities: No edema  Neurological: Alert oriented x 4, grossly non-focal Psychological:  Alert and cooperative. Normal mood and affect  ASSESSMENT AND PLAN: *Epigastric pain with nausea and vomiting: Symptoms present for almost a year.  Is on pantoprazole 40 mg twice daily and takes Pepto-Bismol and Alka-Seltzer to try to relieve her  symptoms as well.  Is on Zofran for the nausea, which she uses regularly.  CT scan in November was unremarkable for any cause of her symptoms.  She is a longstanding diabetic, on insulin.  Question if she has diabetic gastroparesis.  She is also on pain medications which could worsen her gastric emptying as well.  We will plan for EGD first to rule out ulcer disease, esophagitis, etc.  I am going to add Carafate before meals and at bedtime.  Prescription sent to the pharmacy. *IDDM:  Insulin will be adjusted prior to endoscopic procedure per protocol. Will resume normal dosing after procedure.   CC:  [name], MD

## 2020-11-29 ENCOUNTER — Encounter: Payer: Self-pay | Admitting: Family Medicine

## 2020-11-29 ENCOUNTER — Encounter: Payer: Self-pay | Admitting: *Deleted

## 2020-11-29 NOTE — Progress Notes (Signed)
Assessment and plan noted.  May need gastric emptying scan if EGD unrevealing

## 2020-11-30 ENCOUNTER — Encounter: Payer: Self-pay | Admitting: Family Medicine

## 2020-11-30 ENCOUNTER — Other Ambulatory Visit: Payer: Self-pay | Admitting: Family Medicine

## 2020-11-30 LAB — MICROALBUMIN / CREATININE URINE RATIO
Creatinine, Urine: 318.3 mg/dL
Microalb/Creat Ratio: 39 mg/g{creat} — ABNORMAL HIGH (ref 0–29)
Microalbumin, Urine: 125.2 ug/mL

## 2020-11-30 LAB — HCV RNA QUANT RFLX ULTRA OR GENOTYP: HCV Quant Baseline: NOT DETECTED [IU]/mL

## 2020-11-30 MED ORDER — DAPAGLIFLOZIN PROPANEDIOL 5 MG PO TABS: 5.0000 mg | ORAL_TABLET | Freq: Every day | ORAL | 3 refills | Status: DC

## 2020-12-08 ENCOUNTER — Other Ambulatory Visit: Payer: Self-pay | Admitting: Family Medicine

## 2020-12-08 DIAGNOSIS — K219 Gastro-esophageal reflux disease without esophagitis: Secondary | ICD-10-CM

## 2020-12-09 ENCOUNTER — Ambulatory Visit
Admission: RE | Admit: 2020-12-09 | Discharge: 2020-12-09 | Disposition: A | Discharge status: Home / Self Care | Payer: 59 | Source: Ambulatory Visit | Attending: Orthopedic Surgery | Admitting: Orthopedic Surgery

## 2020-12-09 ENCOUNTER — Other Ambulatory Visit: Payer: Self-pay

## 2020-12-09 DIAGNOSIS — R531 Weakness: Secondary | ICD-10-CM

## 2020-12-10 ENCOUNTER — Encounter: Payer: Self-pay | Admitting: Orthopedic Surgery

## 2020-12-11 NOTE — Telephone Encounter (Signed)
Basically just some mild bulging disc but it is on the right-hand side so I think it would be reasonable to consider injection into the cervical spine with consult with Dr. Ernestina Patches.  Could be tricky with her tracheostomy.

## 2020-12-12 ENCOUNTER — Other Ambulatory Visit: Payer: Self-pay | Admitting: Family Medicine

## 2020-12-12 DIAGNOSIS — K219 Gastro-esophageal reflux disease without esophagitis: Secondary | ICD-10-CM

## 2020-12-12 MED ORDER — PANTOPRAZOLE SODIUM 40 MG PO TBEC: 40.0000 mg | DELAYED_RELEASE_TABLET | Freq: Two times a day (BID) | ORAL | 0 refills | Status: DC

## 2020-12-12 MED ORDER — ONDANSETRON HCL 4 MG PO TABS: 4.0000 mg | ORAL_TABLET | Freq: Three times a day (TID) | ORAL | 0 refills | Status: DC | PRN

## 2020-12-17 ENCOUNTER — Other Ambulatory Visit: Payer: Self-pay | Admitting: Family Medicine

## 2020-12-17 DIAGNOSIS — M25561 Pain in right knee: Secondary | ICD-10-CM

## 2020-12-17 NOTE — Telephone Encounter (Signed)
I called and talked to [facility].  She is yet some right-sided foraminal stenosis which she does not want surgery for and does not want injection for which is understandable.  Rena please set her up for some physical therapy is focusing on cervical traction.  Also she would like a follow-up appointment at some time so we can x-ray her right knee and assess that since she is falling down the stairs once or twice.  Thanks

## 2020-12-18 NOTE — Telephone Encounter (Signed)
Okay to come in for appointment for knee.  I will think it is a major issue with the knee if she has been walking around on it so I think is okay for her to wait for a clinic appointment in the near future thanks

## 2020-12-21 ENCOUNTER — Encounter: Payer: Self-pay | Admitting: Family Medicine

## 2020-12-21 ENCOUNTER — Other Ambulatory Visit: Payer: Self-pay

## 2020-12-21 ENCOUNTER — Ambulatory Visit (INDEPENDENT_AMBULATORY_CARE_PROVIDER_SITE_OTHER): Payer: 59 | Admitting: Surgical

## 2020-12-21 ENCOUNTER — Ambulatory Visit: Payer: Self-pay

## 2020-12-21 DIAGNOSIS — M25561 Pain in right knee: Secondary | ICD-10-CM

## 2020-12-21 DIAGNOSIS — M25461 Effusion, right knee: Secondary | ICD-10-CM | POA: Diagnosis not present

## 2020-12-22 ENCOUNTER — Encounter: Payer: Self-pay | Admitting: Surgical

## 2020-12-22 NOTE — Progress Notes (Signed)
Office Visit Note   Patient: [name]           Date of Birth: 01/16/1971           MRN: [id number] Visit Date: 12/21/2020 Requested by: [name], MD [place],  [place] [address] PCP: [name], MD  Subjective: Chief Complaint  Patient presents with  . Right Knee - Pain    HPI: [name] is a 50 y.o. female who presents to the office complaining of right knee pain.  Patient complains of right knee pain that has been worsening over the last 3 to 4 months.  She had a fall about 3 months ago and started to have some increased right knee pain.  Then on 11/28/2020 she had another fall that severely worsened her knee pain.  Her knee is now giving way on her throughout the day.  She has increased swelling and has to ambulate with a cane due to her pain.  Localizes pain to the anterior aspect of the knee diffusely with radiation down into the shin.  She also has low back pain that is worse since her recent fall but states that this knee pain is the most severe source of her pain and does not feel radicular.  She has no history of knee surgery.  She has been trying Voltaren gel without any relief.  She also takes oxycodone as she receives from another provider that is also not helping with her pain.  Denies groin pain but does note anterior thigh pain sometimes.  She has new clicking/popping sensation with moving her knee and states that she has never felt this before in the past..                ROS: All systems reviewed are negative as they relate to the chief complaint within the history of present illness.  Patient denies fevers or chills.  Assessment & Plan: Visit Diagnoses:  1. Acute pain of right knee   2. Effusion, right knee     Plan: Patient is a 50 year old female who presents complaining of worsening right knee pain.  She has had multiple falls with the most recent fall on 11/28/2020 which has brought on severe right knee  pain that is making it difficult to weight-bear.  She also has new clicking and popping sensation in the right knee and severely reduced passive motion of the knee secondary to guarding on exam today.  She feels the knee is giving out on her and she has to use a cane to ambulate.  Radiographs taken today are negative for any acute abnormalities and she has fairly well-preserved joint spaces.  She has no significant history of knee pain prior to this.  With large amount of swelling and severe pain, plan to order MRI of the right knee for further evaluation to evaluate for meniscal injury.  Follow-up after MRI to review results.  Follow-Up Instructions: No follow-ups on file.   Orders:  Orders Placed This Encounter  Procedures  . XR Knee 1-2 Views Right  . MR Knee Right w/o contrast   No orders of the defined types were placed in this encounter.     Procedures: No procedures performed   Clinical Data: No additional findings.  Objective: Vital Signs: LMP  (LMP Unknown)   Physical Exam:  Constitutional: Patient appears well-developed HEENT:  Head: Normocephalic Eyes:EOM are normal Neck: Normal range of motion Cardiovascular: Normal rate Pulmonary/chest: Effort normal Neurologic: Patient is  alert Skin: Skin is warm Psychiatric: Patient has normal mood and affect  Ortho Exam: Ortho exam demonstrates severe tenderness throughout the right knee.  Focally tender over the medial lateral joint lines.  No calf tenderness.  Able to dorsiflex and plantarflex the right foot.  Moderate effusion compared with the contralateral leg.  No effusion of the left leg.  Ambulates with significant antalgia.  No pain with logroll of the right lower extremity.  Difficulty extending her right knee fully past 10 degrees of extension.  Unable to flex the knee passively past about 30 degrees without severe pain.  Patient is guarding throughout exam.  Ligamentous laxity not able to be ascertained due to patient's  guarding.  Specialty Comments:  No specialty comments available.  Imaging: No results found.   PMFS History: Patient Active Problem List   Diagnosis Date Noted  . Abdominal pain, epigastric 11/28/2020  . Nausea and vomiting 11/28/2020  . COVID-19 09/16/2020  . Dyspnea 08/26/2020  . Rotator cuff tear 07/28/2019  . Insulin dependent type 2 diabetes mellitus ([place]) 04/12/2019  . Chest pain of uncertain etiology 41/32/4401  . Lactic acidosis 03/22/2019  . Chronic right shoulder pain   . Community acquired pneumonia 10/25/2018  . Asthma, chronic, unspecified asthma severity, with acute exacerbation 10/25/2018  . Acute respiratory failure with hypoxia and hypercapnia ([place]) 10/23/2018  . Acute pain of right shoulder due to trauma 10/05/2018  . Chronic bilateral low back pain without sciatica 10/05/2018  . Dysphonia 10/05/2018  . Severe asthma with exacerbation 10/05/2018  . Rotator cuff arthropathy, right 06/15/2018  . AKI (acute kidney injury) ([place]) 03/06/2018  . Carpal tunnel syndrome 01/18/2018  . Heart failure ([place]) 05/20/2017  . Normocytic anemia 05/06/2017  . Tracheostomy dependent ([place]) 03/26/2017  . Dysphagia 03/26/2017  . Status post tracheostomy ([place]) 03/14/2017  . Klebsiella cystitis 03/14/2017  . Morbid obesity ([place]) 03/13/2017  . Subglottic stenosis 03/06/2017  . Asthma 03/06/2017  . Chronic diastolic heart failure ([place]) 03/06/2017  . Tracheal stenosis 03/04/2017  . Acute blood loss anemia   . Generalized anxiety disorder   . Chronic pain syndrome   . GERD (gastroesophageal reflux disease) 02/22/2016  . Depression 02/22/2016  . Migraine 01/30/2015  . Bulging lumbar disc 09/05/2013  . Essential hypertension 09/05/2013  . Allergic rhinitis, seasonal 08/11/2012  . Chronic cough 08/11/2012  . OSA (obstructive sleep apnea) 12/04/2011   Past Medical History:  Diagnosis Date  . Allergy   . Anxiety   . Arthritis   . Asthma   . Chronic back pain   . Chronic  chest pain    resolved, no problems since 2019 per patient 07/27/19  . COPD (chronic obstructive pulmonary disease) ([place])   . Depression   . DM (diabetes mellitus) ([place])    INSULIN DEPENDENT - TYPE 1  . GERD (gastroesophageal reflux disease)   . Headache(784.0)   . Hyperlipidemia    no med, diet controlled  . Hypertension   . Hypokalemia   . Respiratory disease 05/2017   uses inhalers, neb tx prn, no oxygen  . Sleep apnea    does not use CPAP due to trach  . Tracheostomy in place [facility]) 02/2017    Family History  Problem Relation Age of Onset  . Heart attack Mother   . Stroke Mother   . Diabetes Mother   . Hypertension Mother   . Arthritis Mother   . Stroke Father   . Hypertension Sister   . Diabetes Sister   .  Seizures Brother   . Diabetes Brother   . Colon cancer Neg Hx   . Rectal cancer Neg Hx   . Stomach cancer Neg Hx     Past Surgical History:  Procedure Laterality Date  . ABDOMINAL HYSTERECTOMY  2009  . APPENDECTOMY    . CESAREAN SECTION     x 3  . CHOLECYSTECTOMY N/A 03/02/2014   Procedure: LAPAROSCOPIC CHOLECYSTECTOMY;  Surgeon: Joyice Faster. Cornett, MD;  Location: [facility];  Service: General;  Laterality: N/A;  . HERNIA REPAIR    . PANENDOSCOPY N/A 03/04/2017   Procedure: PANENDOSCOPY WITH POSSIBLE FOREIGN BODY REMOVAL;  Surgeon: Jodi Marble, MD;  Location: [facility];  Service: ENT;  Laterality: N/A;  . ROTATOR CUFF REPAIR Left   . ROTATOR CUFF REPAIR Right 07/27/2019  . SHOULDER ARTHROSCOPY WITH SUBACROMIAL DECOMPRESSION, ROTATOR CUFF REPAIR AND BICEP TENDON REPAIR Right 07/28/2019   Procedure: RIGHT SHOULDER ARTHROSCOP, MINI OPEN ROTATOR CUFF TEAR REPAIR,  BICEPS TENODESIS, DISTAL CLAVICLE EXCISION;  Surgeon: Meredith Pel, MD;  Location: [facility];  Service: Orthopedics;  Laterality: Right;  . TRACHEOSTOMY  02/2017  . VESICOVAGINAL FISTULA CLOSURE W/ TAH  2009   Social History   Occupational History  . Not on file  Tobacco Use  . Smoking status: Former  Smoker    Packs/day: 0.25    Years: 22.00    Pack years: 5.50    Types: Cigarettes    Quit date: 01/20/2017    Years since quitting: 3.9  . Smokeless tobacco: Never Used  Vaping Use  . Vaping Use: Never used  Substance and Sexual Activity  . Alcohol use: Yes    Alcohol/week: 0.0 standard drinks    Comment: social wine  . Drug use: Yes    Types: Marijuana    Comment: Last Use In July 2021  . Sexual activity: Yes    Partners: Male    Birth control/protection: Surgical    Comment: Hysterectomy

## 2020-12-26 ENCOUNTER — Ambulatory Visit (AMBULATORY_SURGERY_CENTER): Payer: 59 | Admitting: Internal Medicine

## 2020-12-26 ENCOUNTER — Encounter: Payer: Self-pay | Admitting: Internal Medicine

## 2020-12-26 ENCOUNTER — Other Ambulatory Visit: Payer: Self-pay

## 2020-12-26 VITALS — BP 149/94 | HR 95 | Temp 95.3°F | Resp 15 | Ht 61.0 in | Wt 232.0 lb

## 2020-12-26 DIAGNOSIS — R112 Nausea with vomiting, unspecified: Secondary | ICD-10-CM

## 2020-12-26 DIAGNOSIS — R1013 Epigastric pain: Secondary | ICD-10-CM | POA: Diagnosis not present

## 2020-12-26 MED ORDER — SODIUM CHLORIDE 0.9 % IV SOLN: 500.0000 mL | Freq: Once | INTRAVENOUS | Status: DC

## 2020-12-26 NOTE — Patient Instructions (Signed)
Please read handouts provided. Continue present medications. Good diabetic control is important for the gastrointestinal tract. Resume general medical care with your primary provider.     YOU HAD AN ENDOSCOPIC PROCEDURE TODAY AT [facility]:   Refer to the procedure report that was given to you for any specific questions about what was found during the examination.  If the procedure report does not answer your questions, please call your gastroenterologist to clarify.  If you requested that your care partner not be given the details of your procedure findings, then the procedure report has been included in a sealed envelope for you to review at your convenience later.  YOU SHOULD EXPECT: Some feelings of bloating in the abdomen. Passage of more gas than usual.  Walking can help get rid of the air that was put into your GI tract during the procedure and reduce the bloating. If you had a lower endoscopy (such as a colonoscopy or flexible sigmoidoscopy) you may notice spotting of blood in your stool or on the toilet paper. If you underwent a bowel prep for your procedure, you may not have a normal bowel movement for a few days.  Please Note:  You might notice some irritation and congestion in your nose or some drainage.  This is from the oxygen used during your procedure.  There is no need for concern and it should clear up in a day or so.  SYMPTOMS TO REPORT IMMEDIATELY:    Following upper endoscopy (EGD)  Vomiting of blood or coffee ground material  New chest pain or pain under the shoulder blades  Painful or persistently difficult swallowing  New shortness of breath  Fever of 100F or higher  Black, tarry-looking stools  For urgent or emergent issues, a gastroenterologist can be reached at any hour by calling [phone]. Do not use MyChart messaging for urgent concerns.    DIET:  We do recommend a small meal at first, but then you may proceed to your regular diet.   Drink plenty of fluids but you should avoid alcoholic beverages for 24 hours.  ACTIVITY:  You should plan to take it easy for the rest of today and you should NOT DRIVE or use heavy machinery until tomorrow (because of the sedation medicines used during the test).    FOLLOW UP: Our staff will call the number listed on your records 48-72 hours following your procedure to check on you and address any questions or concerns that you may have regarding the information given to you following your procedure. If we do not reach you, we will leave a message.  We will attempt to reach you two times.  During this call, we will ask if you have developed any symptoms of COVID 19. If you develop any symptoms (ie: fever, flu-like symptoms, shortness of breath, cough etc.) before then, please call [phone].  If you test positive for Covid 19 in the 2 weeks post procedure, please call and report this information to [place].    If any biopsies were taken you will be contacted by phone or by letter within the next 1-3 weeks.  Please call us at [phone] if you have not heard about the biopsies in 3 weeks.    SIGNATURES/CONFIDENTIALITY: You and/or your care partner have signed paperwork which will be entered into your electronic medical record.  These signatures attest to the fact that that the information above on your After Visit Summary has been reviewed and is understood.  Full responsibility of the confidentiality of this discharge information lies with you and/or your care-partner.

## 2020-12-26 NOTE — Progress Notes (Signed)
pt tolerated well. VSS. awake and to recovery. Report given to RN. Oral bite block insitu. NRBM to recovery.

## 2020-12-26 NOTE — Progress Notes (Signed)
Called to room to assist during endoscopic procedure.  Patient ID and intended procedure confirmed with present staff. Received instructions for my participation in the procedure from the performing physician.  

## 2020-12-26 NOTE — Op Note (Signed)
[facility] Patient Name: [name] Procedure Date: 12/26/2020 10:35 AM MRN: [id number] Endoscopist: Docia Chuck. Henrene Pastor , MD Age: 50 Referring MD:  Date of Birth: 03-31-1971 Gender: Female Account #: [id number] Procedure:                Upper GI endoscopy Indications:              Epigastric abdominal pain, Nausea with vomiting Medicines:                Monitored Anesthesia Care Procedure:                Pre-Anesthesia Assessment:                           - Prior to the procedure, a History and Physical                            was performed, and patient medications and                            allergies were reviewed. The patient's tolerance of                            previous anesthesia was also reviewed. The risks                            and benefits of the procedure and the sedation                            options and risks were discussed with the patient.                            All questions were answered, and informed consent                            was obtained. Prior Anticoagulants: The patient has                            taken no previous anticoagulant or antiplatelet                            agents. ASA Grade Assessment: II - A patient with                            mild systemic disease. After reviewing the risks                            and benefits, the patient was deemed in                            satisfactory condition to undergo the procedure.                           After obtaining informed consent, the endoscope was  passed under direct vision. Throughout the                            procedure, the patient's blood pressure, pulse, and                            oxygen saturations were monitored continuously. The                            Endoscope was introduced through the mouth, and                            advanced to the second part of duodenum. The upper                             GI endoscopy was accomplished without difficulty.                            The patient tolerated the procedure well. Scope In: Scope Out: Findings:                 The esophagus was normal.                           The stomach was normal.                           The examined duodenum was normal.                           The cardia and gastric fundus were normal on                            retroflexion. Complications:            No immediate complications. Estimated Blood Loss:     Estimated blood loss: none. Impression:               1. Normal EGD                           2. Chronic postprandial abdominal pain with nausea                            and vomiting. Recommendation:           1. Patient has a contact number available for                            emergencies. The signs and symptoms of potential                            delayed complications were discussed with the                            patient. Return to normal activities tomorrow.  Written discharge instructions were provided to the                            patient.                           2. Resume previous diet. Recommend small portions.                           3. Continue present medications.                           4. Good diabetic control is important for the                            gastrointestinal tract                           5. Schedule solid-phase gastric emptying scan "rule                            out gastroparesis". We will contact to after the                            results are available.                           6. Resume general medical care with your primary                            provider Docia Chuck. Henrene Pastor, MD 12/26/2020 10:57:17 AM This report has been signed electronically.

## 2020-12-27 ENCOUNTER — Other Ambulatory Visit: Payer: Self-pay

## 2020-12-27 DIAGNOSIS — R112 Nausea with vomiting, unspecified: Secondary | ICD-10-CM

## 2020-12-27 DIAGNOSIS — R1013 Epigastric pain: Secondary | ICD-10-CM

## 2020-12-28 ENCOUNTER — Telehealth: Payer: Self-pay

## 2020-12-28 ENCOUNTER — Telehealth: Payer: Self-pay | Admitting: *Deleted

## 2020-12-28 NOTE — Telephone Encounter (Signed)
Attempted to reach patient for post-procedure f/u call. No answer. Left message that we will make another attempt to reach her later today and for her to please not hesitate to call us if she has any questions/concerns regarding her care. 

## 2020-12-28 NOTE — Telephone Encounter (Signed)
  Follow up Call-  Call back number 12/26/2020 06/12/2020  Post procedure Call Back phone  # [phone]  Permission to leave phone message Yes Yes  Some recent data might be hidden     Patient questions:  Do you have a fever, pain , or abdominal swelling? No. Pain Score  0 *  Have you tolerated food without any problems? Yes.    Have you been able to return to your normal activities? Yes.    Do you have any questions about your discharge instructions: Diet   No. Medications  No. Follow up visit  No.  Do you have questions or concerns about your Care? No.  Actions: * If pain score is 4 or above: No action needed, pain <4.  1. Have you developed a fever since your procedure? no  2.   Have you had an respiratory symptoms (SOB or cough) since your procedure? no  3.   Have you tested positive for COVID 19 since your procedure no  4.   Have you had any family members/close contacts diagnosed with the COVID 19 since your procedure?  no   If yes to any of these questions please route to Joylene John, RN and Joella Prince, RN

## 2021-01-04 ENCOUNTER — Ambulatory Visit
Admission: RE | Admit: 2021-01-04 | Discharge: 2021-01-04 | Disposition: A | Discharge status: Home / Self Care | Payer: 59 | Source: Ambulatory Visit | Attending: Surgical | Admitting: Surgical

## 2021-01-04 ENCOUNTER — Other Ambulatory Visit: Payer: Self-pay

## 2021-01-04 DIAGNOSIS — M25561 Pain in right knee: Secondary | ICD-10-CM

## 2021-01-15 ENCOUNTER — Ambulatory Visit (HOSPITAL_COMMUNITY)
Admission: RE | Admit: 2021-01-15 | Discharge: 2021-01-15 | Disposition: A | Discharge status: Home / Self Care | Payer: 59 | Source: Ambulatory Visit | Attending: Internal Medicine | Admitting: Internal Medicine

## 2021-01-15 ENCOUNTER — Other Ambulatory Visit: Payer: Self-pay

## 2021-01-15 DIAGNOSIS — R112 Nausea with vomiting, unspecified: Secondary | ICD-10-CM | POA: Insufficient documentation

## 2021-01-15 DIAGNOSIS — R1013 Epigastric pain: Secondary | ICD-10-CM

## 2021-01-15 MED ORDER — TECHNETIUM TC 99M SULFUR COLLOID
2.1000 | Freq: Once | INTRAVENOUS | Status: AC | PRN
  Administered 2021-01-15: 2.1 via ORAL

## 2021-01-16 ENCOUNTER — Ambulatory Visit (INDEPENDENT_AMBULATORY_CARE_PROVIDER_SITE_OTHER): Payer: 59 | Admitting: Orthopedic Surgery

## 2021-01-16 DIAGNOSIS — M1711 Unilateral primary osteoarthritis, right knee: Secondary | ICD-10-CM | POA: Diagnosis not present

## 2021-01-16 DIAGNOSIS — M25461 Effusion, right knee: Secondary | ICD-10-CM

## 2021-01-20 ENCOUNTER — Encounter: Payer: Self-pay | Admitting: Orthopedic Surgery

## 2021-01-20 NOTE — Progress Notes (Signed)
Office Visit Note   Patient: [name]           Date of Birth: [birth year]           MRN: [id number] Visit Date: 01/16/2021 Requested by: [name], MD [place],  [place] [id number] PCP: [name], MD  Subjective: Chief Complaint  Patient presents with  . Other     Scan review    HPI: [name] is a 50 y.o. female who presents to the office complaining of right knee pain.  Patient has continued right knee pain that is somewhat improved compared with how she felt at her last office visit.  She still has significant discomfort especially with navigating stairs.  Localizes pain to the medial and lateral aspects of her right knee.  Denies any mechanical locking symptoms or frank instability but does feel the knee wants to give way on her.  Knee swells up on and off.  She noted history of diabetes with last A1c about 8.0.  Checked her blood glucose this morning which was about 240 which is typical..                ROS: All systems reviewed are negative as they relate to the chief complaint within the history of present illness.  Patient denies fevers or chills.  Assessment & Plan: Visit Diagnoses:  1. Unilateral primary osteoarthritis, right knee   2. Effusion, right knee     Plan: Patient is a 50 year old female presents for review of right knee MRI.  She is still having significant discomfort and pain that causes her to wake up at times from the right knee pain.  It is somewhat improved compared with her last office visit.  MRI of the right knee revealed partial-thickness cartilage loss in all 3 compartments with small full-thickness cartilage defect of the lateral tibial plateau along with meniscal extrusion of the medial meniscus and a tiny radial tear of the medial meniscus.  There is no meniscal root tear noted and nothing seems arthroscopically treatable after reviewing the scan with the patient.  Best option for her  would be to try a cortisone injection into the right knee but need her blood glucose to be under better control before this is an option.  Patient understands and will work on better control of her diabetes and then return if she would still like to try injection in the future.  Follow-Up Instructions: No follow-ups on file.   Orders:  No orders of the defined types were placed in this encounter.  No orders of the defined types were placed in this encounter.     Procedures: No procedures performed   Clinical Data: No additional findings.  Objective: Vital Signs: LMP  (LMP Unknown)   Physical Exam:  Constitutional: Patient appears well-developed HEENT:  Head: Normocephalic Eyes:EOM are normal Neck: Normal range of motion Cardiovascular: Normal rate Pulmonary/chest: Effort normal Neurologic: Patient is alert Skin: Skin is warm Psychiatric: Patient has normal mood and affect  Ortho Exam: Ortho exam demonstrates right knee with moderate tenderness over the medial and lateral joint lines.  Small effusion present.  No calf tenderness.  Able to perform straight leg raise.  Walks with antalgia.  No pain with hip range of motion.  Specialty Comments:  No specialty comments available.  Imaging: No results found.   PMFS History: Patient Active Problem List   Diagnosis Date Noted  . Abdominal pain, epigastric 11/28/2020  . Nausea and vomiting  11/28/2020  . COVID-19 09/16/2020  . Dyspnea 08/26/2020  . Rotator cuff tear 07/28/2019  . Insulin dependent type 2 diabetes mellitus ([place]) 04/12/2019  . Chest pain of uncertain etiology 38/18/2993  . Lactic acidosis 03/22/2019  . Chronic right shoulder pain   . Community acquired pneumonia 10/25/2018  . Asthma, chronic, unspecified asthma severity, with acute exacerbation 10/25/2018  . Acute respiratory failure with hypoxia and hypercapnia ([place]) 10/23/2018  . Acute pain of right shoulder due to trauma 10/05/2018  . Chronic  bilateral low back pain without sciatica 10/05/2018  . Dysphonia 10/05/2018  . Severe asthma with exacerbation 10/05/2018  . Rotator cuff arthropathy, right 06/15/2018  . AKI (acute kidney injury) ([place]) 03/06/2018  . Carpal tunnel syndrome 01/18/2018  . Heart failure ([place]) 05/20/2017  . Normocytic anemia 05/06/2017  . Tracheostomy dependent ([place]) 03/26/2017  . Dysphagia 03/26/2017  . Status post tracheostomy ([place]) 03/14/2017  . Klebsiella cystitis 03/14/2017  . Morbid obesity ([place]) 03/13/2017  . Subglottic stenosis 03/06/2017  . Asthma 03/06/2017  . Chronic diastolic heart failure ([place]) 03/06/2017  . Tracheal stenosis 03/04/2017  . Acute blood loss anemia   . Generalized anxiety disorder   . Chronic pain syndrome   . GERD (gastroesophageal reflux disease) 02/22/2016  . Depression 02/22/2016  . Migraine 01/30/2015  . Bulging lumbar disc 09/05/2013  . Essential hypertension 09/05/2013  . Allergic rhinitis, seasonal 08/11/2012  . Chronic cough 08/11/2012  . OSA (obstructive sleep apnea) 12/04/2011   Past Medical History:  Diagnosis Date  . Allergy   . Anxiety   . Arthritis   . Asthma   . Chronic back pain   . Chronic chest pain    resolved, no problems since 2019 per patient 07/27/19  . COPD (chronic obstructive pulmonary disease) ([place])   . Depression   . DM (diabetes mellitus) ([place])    INSULIN DEPENDENT - TYPE 1  . GERD (gastroesophageal reflux disease)   . Headache(784.0)   . Hyperlipidemia    no med, diet controlled  . Hypertension   . Hypokalemia   . Respiratory disease 05/2017   uses inhalers, neb tx prn, no oxygen  . Sleep apnea    does not use CPAP due to trach  . Tracheostomy in place [facility]) 02/2017    Family History  Problem Relation Age of Onset  . Heart attack Mother   . Stroke Mother   . Diabetes Mother   . Hypertension Mother   . Arthritis Mother   . Stroke Father   . Hypertension Sister   . Diabetes Sister   . Seizures Brother   . Diabetes  Brother   . Colon cancer Neg Hx   . Rectal cancer Neg Hx   . Stomach cancer Neg Hx     Past Surgical History:  Procedure Laterality Date  . ABDOMINAL HYSTERECTOMY  2009  . APPENDECTOMY    . CESAREAN SECTION     x 3  . CHOLECYSTECTOMY N/A 03/02/2014   Procedure: LAPAROSCOPIC CHOLECYSTECTOMY;  Surgeon: Joyice Faster. Cornett, MD;  Location: [facility];  Service: General;  Laterality: N/A;  . HERNIA REPAIR    . PANENDOSCOPY N/A 03/04/2017   Procedure: PANENDOSCOPY WITH POSSIBLE FOREIGN BODY REMOVAL;  Surgeon: Jodi Marble, MD;  Location: [facility];  Service: ENT;  Laterality: N/A;  . ROTATOR CUFF REPAIR Left   . ROTATOR CUFF REPAIR Right 07/27/2019  . SHOULDER ARTHROSCOPY WITH SUBACROMIAL DECOMPRESSION, ROTATOR CUFF REPAIR AND BICEP TENDON REPAIR Right 07/28/2019   Procedure: RIGHT SHOULDER ARTHROSCOP,  MINI OPEN ROTATOR CUFF TEAR REPAIR,  BICEPS TENODESIS, DISTAL CLAVICLE EXCISION;  Surgeon: Meredith Pel, MD;  Location: [facility];  Service: Orthopedics;  Laterality: Right;  . TRACHEOSTOMY  02/2017  . VESICOVAGINAL FISTULA CLOSURE W/ TAH  2009   Social History   Occupational History  . Not on file  Tobacco Use  . Smoking status: Former Smoker    Packs/day: 0.25    Years: 22.00    Pack years: 5.50    Types: Cigarettes    Quit date: 01/21/2016    Years since quitting: 5.0  . Smokeless tobacco: Never Used  Vaping Use  . Vaping Use: Never used  Substance and Sexual Activity  . Alcohol use: Yes    Alcohol/week: 0.0 standard drinks    Comment: social wine  . Drug use: Yes    Types: Marijuana    Comment: last use 2 weeks ago  . Sexual activity: Yes    Partners: Male    Birth control/protection: Surgical    Comment: Hysterectomy

## 2021-01-23 ENCOUNTER — Other Ambulatory Visit: Payer: Self-pay | Admitting: Family Medicine

## 2021-01-23 MED ORDER — MISC. DEVICES MISC: 0 refills | Status: DC

## 2021-01-25 ENCOUNTER — Other Ambulatory Visit: Payer: Self-pay | Admitting: Endocrinology

## 2021-01-25 ENCOUNTER — Other Ambulatory Visit: Payer: Self-pay | Admitting: Family Medicine

## 2021-01-25 DIAGNOSIS — E109 Type 1 diabetes mellitus without complications: Secondary | ICD-10-CM

## 2021-01-25 MED ORDER — ONDANSETRON HCL 4 MG PO TABS: 4.0000 mg | ORAL_TABLET | Freq: Three times a day (TID) | ORAL | 0 refills | Status: DC | PRN

## 2021-01-25 MED ORDER — ONETOUCH VERIO W/DEVICE KIT: 1.0000 | PACK | Freq: Every day | 0 refills | Status: DC

## 2021-02-20 ENCOUNTER — Encounter (HOSPITAL_COMMUNITY): Payer: Self-pay | Admitting: Oral Surgery

## 2021-02-20 ENCOUNTER — Other Ambulatory Visit: Payer: Self-pay

## 2021-02-20 NOTE — Progress Notes (Addendum)
PCP - Dr. Charlane Ferretti  Cardiologist - Denies  Endocrine- Dr. Loanne Drilling  Chest x-ray - 09/16/20 (E)  EKG - 09/20/20 (E)  Stress Test - Denies  ECHO - 06/04/17 (E)  Cardiac Cath - Denies  AICD-na PM-na LOOP-na  Sleep Study - Yes- Positive CPAP - Denies  LABS- 02/22/21: CBC, BMP  ASA- Denies  ERAS- No  HA1C- 11/16/20: 8.5 (E) Fasting Blood Sugar - 150-315 Checks Blood Sugar ___3__ times a day  Anesthesia- Yes- elevated A1C  Pt denies having chest pain, sob, or fever during the pre-op phone call. All instructions explained to the pt, with a verbal understanding of the material including: as of today, stop taking all Aspirin (unless instructed by your doctor) and Other Aspirin containing products, Vitamins, Fish oils, and Herbal medications. Also stop all NSAIDS i.e. Advil, Ibuprofen, Motrin, Aleve, Anaprox, Naproxen, BC, Goody Powders, and all Supplements.  How to Manage Your Diabetes Before and After Surgery  o Check your blood sugar the morning of your surgery when you wake up and every 2 hours until you get to the Short Stay unit. . If your blood sugar is less than 70 mg/dL, you will need to treat for low blood sugar: o Do not take insulin. o Treat a low blood sugar (less than 70 mg/dL) with  cup of clear juice (cranberry or apple), 4 glucose tablets, OR glucose gel. Recheck blood sugar in 15 minutes after treatment (to make sure it is greater than 70 mg/dL). If your blood sugar is not greater than 70 mg/dL on recheck, call [phone] o  for further instructions.               . If your CBG is greater than 220 mg/dL, call the number above for further instructions.  WHAT DO I DO ABOUT MY DIABETES MEDICATION?  Marland Kitchen Do not take Dapagliflozin propanediol (FARXIGA) and Tresiba  the morning of surgery.  . THE NIGHT BEFORE SURGERY, take ____120_______ units of _____Tresiba______insulin.  . The day of surgery, do not take other diabetes injectable Ozempic.  Reviewed and Endorsed  by [facility] Patient Education Committee, August 2015   Pt also instructed to practice social distancing even though she will not be tested for COVID-19. The opportunity to ask questions was provided.   Coronavirus Screening  Have you experienced the following symptoms:  Cough yes/no: No Fever (>100.17F)  yes/no: No Runny nose yes/no: No Sore throat yes/no: No Difficulty breathing/shortness of breath  yes/no: No  Have you or a family member traveled in the last 14 days and where? yes/no: No   If the patient indicates "YES" to the above questions, their PAT will be rescheduled to limit the exposure to others and, the surgeon will be notified. THE PATIENT WILL NEED TO BE ASYMPTOMATIC FOR 14 DAYS.   If the patient is not experiencing any of these symptoms, the PAT nurse will instruct them to NOT bring anyone with them to their appointment since they may have these symptoms or traveled as well.   Please remind your patients and families that hospital visitation restrictions are in effect and the importance of the restrictions.

## 2021-02-20 NOTE — H&P (Signed)
HISTORY AND PHYSICAL  [name] is a 50 y.o. female patient with CC: Painful teeth.  No diagnosis found.  Past Medical History:  Diagnosis Date  . Allergy   . Anxiety   . Arthritis   . Asthma   . Chronic back pain   . Chronic chest pain    resolved, no problems since 2019 per patient 07/27/19  . COPD (chronic obstructive pulmonary disease) ([place])   . Depression   . DM (diabetes mellitus) ([place])    INSULIN DEPENDENT - TYPE 1  . GERD (gastroesophageal reflux disease)   . Headache(784.0)   . Hyperlipidemia    no med, diet controlled  . Hypertension   . Hypokalemia   . Respiratory disease 05/2017   uses inhalers, neb tx prn, no oxygen  . Sleep apnea    does not use CPAP due to trach  . Tracheostomy in place [facility]) 02/2017    No current facility-administered medications for this encounter.   Current Outpatient Medications  Medication Sig Dispense Refill  . acetaminophen (TYLENOL) 325 MG tablet Take 1-2 tablets (325-650 mg total) by mouth every 6 (six) hours as needed for mild pain (pain score 1-3 or temp > 100.5). 60 tablet 0  . albuterol (PROAIR HFA) 108 (90 Base) MCG/ACT inhaler Inhale 2 puffs into the lungs every 6 (six) hours as needed for wheezing or shortness of breath. 18 g 6  . albuterol (PROVENTIL) (2.5 MG/3ML) 0.083% nebulizer solution Take 3 mLs (2.5 mg total) by nebulization every 6 (six) hours as needed for wheezing or shortness of breath. 75 mL 3  . atorvastatin (LIPITOR) 20 MG tablet Take 1 tablet (20 mg total) by mouth daily. 90 tablet 1  . benzonatate (TESSALON) 200 MG capsule Take 1 capsule (200 mg total) by mouth 2 (two) times daily as needed for cough. 20 capsule 0  . Blood Glucose Monitoring Suppl (ONETOUCH VERIO) w/Device KIT 1 each by Does not apply route daily. Use to monitor glucose levels daily; E11.9 1 kit 0  . buprenorphine (BUTRANS) 5 MCG/HR PTWK 1 patch once a week.    Marland Kitchen buPROPion (WELLBUTRIN SR) 150 MG 12 hr tablet Take 1 tablet (150  mg total) by mouth 2 (two) times daily. 180 tablet 1  . dapagliflozin propanediol (FARXIGA) 5 MG TABS tablet Take 1 tablet (5 mg total) by mouth daily before breakfast. 30 tablet 3  . diclofenac Sodium (VOLTAREN) 1 % GEL Apply 4 g topically 4 (four) times daily. 100 g 1  . furosemide (LASIX) 20 MG tablet Take 1 tablet (20 mg total) by mouth daily. 30 tablet 2  . gabapentin (NEURONTIN) 300 MG capsule Take 2 capsules (600 mg total) by mouth 2 (two) times daily. (Patient taking differently: Take 300 mg by mouth See admin instructions. 1 capsule in the am, 1 capsule in the afternoon, 2 caps at bedtime) 360 capsule 1  . glucose blood (ONETOUCH VERIO) test strip Use to monitor glucose levels daily; E11.9 100 each 2  . hydrOXYzine (ATARAX/VISTARIL) 25 MG tablet Take 1 tablet (25 mg total) by mouth 3 (three) times daily as needed for anxiety. 180 tablet 1  . insulin degludec (TRESIBA FLEXTOUCH) 200 UNIT/ML FlexTouch Pen Inject 150 Units into the skin daily. 24 mL 3  . Insulin Pen Needle 31G X 8 MM MISC Inject 1 Units as directed daily.    Marland Kitchen ipratropium-albuterol (DUONEB) 0.5-2.5 (3) MG/3ML SOLN Take by nebulization.    . Melatonin 3 MG TABS Take 3 mg by  mouth at bedtime as needed (for sleep).     . methocarbamol (ROBAXIN) 750 MG tablet Take 750 mg by mouth 3 (three) times daily.    . Misc. Devices MISC Nebulizer device. Diagnosis - Asthma 1 each 0  . Misc. Devices [employer].  Diagnosis-low back pain 1 each 0  . mometasone-formoterol (DULERA) 200-5 MCG/ACT AERO Inhale 2 puffs into the lungs in the morning and at bedtime. 1 each 6  . montelukast (SINGULAIR) 10 MG tablet Take 1 tablet (10 mg total) by mouth at bedtime. 90 tablet 1  . ondansetron (ZOFRAN) 4 MG tablet Take 1 tablet (4 mg total) by mouth every 8 (eight) hours as needed for nausea or vomiting. 30 tablet 0  . OneTouch Delica Lancets 50Y MISC 1 each by Does not apply route daily. Use to monitor glucose levels daily; E11.9 100 each 2  .  oxyCODONE-acetaminophen (PERCOCET) 10-325 MG tablet Take 1 tablet by mouth 4 (four) times daily as needed for pain.     Marland Kitchen OZEMPIC, 1 MG/DOSE, 4 MG/3ML SOPN Inject 1 mg into the skin once a week.    . pantoprazole (PROTONIX) 40 MG tablet Take 1 tablet (40 mg total) by mouth 2 (two) times daily. 60 tablet 0  . polyethylene glycol (MIRALAX) 17 g packet Take 17 g by mouth daily. 14 each 0  . Semaglutide, 1 MG/DOSE, (OZEMPIC, 1 MG/DOSE,) 2 MG/1.5ML SOPN Inject 1 mg into the skin once a week. 9 mL 3  . sertraline (ZOLOFT) 100 MG tablet Take 1 tablet (100 mg total) by mouth at bedtime. 90 tablet 1  . sucralfate (CARAFATE) 1 g tablet Take 1 tablet (1 g total) by mouth 4 (four) times daily -  with meals and at bedtime. 120 tablet 1  . SUMAtriptan (IMITREX) 100 MG tablet At the onset of a migraine; may repeat in 2 hours. Max daily dose 235m 20 tablet 6  . tiZANidine (ZANAFLEX) 4 MG tablet Take 4 mg by mouth every 8 (eight) hours as needed for muscle spasms.    .Marland Kitchentopiramate (TOPAMAX) 100 MG tablet Take 1 tablet (100 mg total) by mouth 2 (two) times daily. 180 tablet 1   Allergies  Allergen Reactions  . Reglan [Metoclopramide] Other (See Comments)    Panic attack   Active Problems:   * No active hospital problems. *  Vitals: There were no vitals taken for this visit. Lab results:No results found for this or any previous visit (from the past 242hour(s)). Radiology Results: No results found. General appearance: alert, cooperative and mildly obese Head: Normocephalic, without obvious abnormality, atraumatic Eyes: negative Nose: Nares normal. Septum midline. Mucosa normal. No drainage or sinus tenderness. Throat: Gross decay teeth # 4, 20. No purulence, trismus. pharynx clear.  Neck: no adenopathy and Trach tube present Resp: clear to auscultation bilaterally Cardio: regular rate and rhythm, S1, S2 normal, no murmur, click, rub or gallop  Assessment:Non-restorable teeth # 4, 20. Plan: Extraction #4,   20. GA. Day surgery.   SDiona Browner6/09/2020

## 2021-02-21 ENCOUNTER — Encounter: Payer: Self-pay | Admitting: Family Medicine

## 2021-02-21 NOTE — Progress Notes (Signed)
Anesthesia Chart Review: Same day workup  History includes former smoker (quit 01/2017), tracheostomy (03/08/17 following glottic intubation injury and subsequent development of tracheal stenosis; seen Q 3 months at [facility] for management/trach exchange; if desires resection/decannulation would require evaluation at [facility] or [facility]) , OSA (no CPAP since trach), HTN, asthma, GERD, chronic pain, DM (insulin dependent), HLD, COPD, obesity.  Hx if asthma, not well controlled. Currently maintained on Dulera, Singulair, as needed duoneb. This is currently managed by her PCP Dr. Charlane Ferretti. Pt was recetnly referred to pulmonology for consideration of Dupixent. Has not been seen yet.   Type 1 diabetes, not well controlled. Last A1c 8.5 on 11/16/20.  Pt will need DOS labs and eval.   Details surrounding tracheostomy history: Admitted 02/18/17 - 02/27/17 to [facility] with acute respiratory failure due to asthma exacerbation and required intubation (02/18/17-02/24/18). Also required CRRT for AKI with refractory hyperkalemia/worsening metabolic acidosis. On 03/03/17, ENT consulted due to concern of coughing up foreign material concerning for part of the ETT. She underwent direct laryngoscopy/bronchoscopy/esophagoscopy on 03/04/17 revealing "Floppy epiglottis. Posterior vocal cord bilateral and posterior comissure eschar c/w intubation injury. Subglottic trachea essentially completely full of what appears to be chronic granulation tissue for a distance of 3-4 centimeters. A curvilinear foreign body possibly consistent with necrotic tracheal ring cartilage." She was left intubated after the case due to not being able to establish an adequate airway, and was transferred to [facility] where she underwent surgical tracheostomy (#^ DCT) on 03/09/19. While in OR, "they found glottis to be extremely edematous, fibrinous debris in posterior glottis and lining the trachea, and trachea very soft and necrotic anteriorly.  Tracheostomy was difficult 2/2 patient's anatomy. Given extremely critical airway, patient sedating for 48 hours s/p trach for more maturation of tracheostomy tract. Per ENT recs,CT Surgery consultedfor further evaluation of possible aberrate innominate artery given critical airway status and risk for development of tracheo-innominate fistula. CTSrecommendingmaintaining strict BP control with goal SBP <140 mmHg, avoid over inflating tracheal cuff, and no indication for acute surgical intervention at this time. Rheumatology also consulted for AI/rheumatological process who ultimately feel that clinical presentation likely post-intubation complication."  She was discharged to [facility]. On 05/18/17 she underwent repeat suspension mircrodirect laryngoscopy (SMDL) but noted to have near complete subglottic stenosis with only minimal improvement s/p dilation. CT scan revealed the segment of scarring to be quite thick. She was referred to Dr. Lianne Moris for potential tracheal resection, but bronchoscopy revealed the stenotic segment to to be too close to the cords to allow for resection. She then underwent attempted resection and reconstruction with Dr. Hendricks Limes, however due to the long segment and intimate relationship to her high riding innominate artery, the procedure was aborted." Last tracheal dilation seen was from 10/30/17 (unable to creat a reasonable opening to then safely ablate the scar). Per operative note, "Unfortunately, she has a difficult problem which is even made more difficult by the length of the stenosis, body habitus, high riding innominate and uncontrolled diabetes. She remains a poor candidate for cricotracheal resection and reanastamosis." Should she desire definitive resection and trach decannulation in the future, she would need to go to the [place] or [place].  She was last seen by ENT Dr. Jerilee Hoh at [facility] on 01/28/21. Per note, "She continues to do well largely without issues  with her indwelling trach. She continues to tolerate finger plugging for short periods of time - she has to take her inner cannula out with the new style trach.  Remains disinterested in [place] or additional humidification accessories. Using home humidification with some frequency - husband adds "not enough". No pneumonia or swallowing troubles." Note states she is using a "new style of 4UN65R" trach. This is replaced every 3 months.   Documentation from replacement 01/28/21: Procedures:  1) Tracheostomy Change FINDINGS:  Mature stoma, stomal collapses upon removal of trach - as last time. Patient tolerates without issue but can be very anxious 4UN65R with lubrication placed without difficultly  Though anxious, the change went smoothly with reassurance.  PROCEDURE DETAILS: The patient was positioned semirecumbent. The velcro trach ties were released. The indwelling 4UN65R CFS trach was removed and replaced with a new 4UN65R. The new trach slid in easily. A new velcro trach tie was secured in place and confirmed to be snug. The patient tolerated the procedure well and without complications.   Procedure Note -Transnasal fiberoptic tracheoscopy Surgeon: Sabino Snipes, M.D. Anesthesia: Topical 0.05% oxymetazoline with 4% lidocaine  Estimated Blood Loss: 0 mL Complications: None   Description of Procedure: With the patient in the sitting position (prior to trach change).The scope was passed through the indwelling trach. It was then removed. The patient tolerated the procedure well. Given no to minimal change over time on laryngoscopy, patient elected to have this performed every other visit, especially given limited interest in larger reconstructive surgery.  Findings:  Considerable dry mucosa and crusting of the trachea, modestly improved from last visit, otherwise widely patent distal trachea. Well-seated tracheostomy tube. No granulation or irritation at distal end of the tracheostomy tube. No active  bleeding or excoriations.  Prior:  Notable dryness and superficial crusting of trachea  Point of maximal abduction, larger than previously captured though remains extremely limited. Can appreciate left sided nature of granulation/scar tuft.  EKG 09/20/20: NSR. Rate 78.  Echo 06/04/17: Study Conclusions - Left ventricle: The cavity size was normal. Wall thickness was normal. Systolic function was normal. The estimated ejection fraction was in the range of 60% to 65%. Wall motion was normal; there were no regional wall motion abnormalities. Doppler parameters are consistent with abnormal left ventricular relaxation (grade 1 diastolic dysfunction). - Mitral valve: Valve area by pressure half-time: 0.72 cm^2.   [name] [facility] Short Stay Center/Anesthesiology Phone [phone] 02/21/2021 12:47 PM

## 2021-02-21 NOTE — Anesthesia Preprocedure Evaluation (Deleted)
Anesthesia Evaluation    Airway        Dental   Pulmonary former smoker,           Cardiovascular hypertension,      Neuro/Psych    GI/Hepatic   Endo/Other  diabetes  Renal/GU      Musculoskeletal   Abdominal   Peds  Hematology   Anesthesia Other Findings   Reproductive/Obstetrics                                                             Anesthesia Evaluation  Patient identified by MRN, date of birth, ID band Patient awake    Reviewed: Allergy & Precautions, NPO status , Patient's Chart, lab work & pertinent test results  Airway Mallampati: Trach       Dental   Pulmonary asthma , sleep apnea , COPD,  COPD inhaler, former smoker,  Tracheostomy in place since 02/2017  Tracheal stenosis   Pulmonary exam normal        Cardiovascular hypertension, Pt. on home beta blockers and Pt. on medications Normal cardiovascular exam  ECG: NSR, rate 94   Neuro/Psych  Headaches, PSYCHIATRIC DISORDERS Anxiety Depression    GI/Hepatic negative GI ROS, Neg liver ROS,   Endo/Other  diabetes, Insulin DependentMorbid obesity  Renal/GU negative Renal ROS     Musculoskeletal  (+) Arthritis ,   Abdominal (+) + obese,   Peds  Hematology HLD   Anesthesia Other Findings right shoulder rotator cuff tear, acromioclavicular osteoarthritis  Reproductive/Obstetrics                           Anesthesia Physical Anesthesia Plan  ASA: IV  Anesthesia Plan: General   Post-op Pain Management:    Induction: Intravenous and Inhalational  PONV Risk Score and Plan: 4 or greater and Midazolam, Dexamethasone, Ondansetron and Treatment may vary due to age or medical condition  Airway Management Planned: Tracheostomy  Additional Equipment:   Intra-op Plan:   Post-operative Plan:   Informed Consent: I have reviewed the patients History and Physical, chart, labs  and discussed the procedure including the risks, benefits and alternatives for the proposed anesthesia with the patient or authorized representative who has indicated his/her understanding and acceptance.       Plan Discussed with: CRNA  Anesthesia Plan Comments: (Reviewed PAT note written 07/27/2019 by Myra Gianotti, PA-C. Has chronic tracheostomy (see APP note for more details). Asthma exacerbation ~ 02/2019.   )       Anesthesia Quick Evaluation  Anesthesia Physical Anesthesia Plan  ASA:   Anesthesia Plan:    Post-op Pain Management:    Induction:   PONV Risk Score and Plan:   Airway Management Planned:   Additional Equipment:   Intra-op Plan:   Post-operative Plan:   Informed Consent:   Plan Discussed with:   Anesthesia Plan Comments: (PAT note by Karoline Caldwell, PA-C: History includes former smoker (quit 01/2017), tracheostomy (03/08/17 following glottic intubation injury and subsequent development of tracheal stenosis; seen Q 3 months at [facility] for management/trach exchange; if desires resection/decannulation would require evaluation at [facility] or [facility]) , OSA (no CPAP since trach), HTN, asthma, GERD, chronic pain, DM (insulin dependent), HLD, COPD, obesity.  Hx if asthma, not well controlled.  Currently maintained on Dulera, Singulair, as needed duoneb. This is currently managed by her PCP Dr. Charlane Ferretti. Pt was recetnly referred to pulmonology for consideration of Dupixent. Has not been seen yet.   Type 1 diabetes, not well controlled. Last A1c 8.5 on 11/16/20.  Pt will need DOS labs and eval.   Details surrounding tracheostomy history: Admitted 02/18/17 - 02/27/17 to [facility] with acute respiratory failure due to asthma exacerbation and required intubation (02/18/17-02/24/18). Also required CRRT for AKI with refractory hyperkalemia/worsening metabolic acidosis. On 03/03/17, ENT consulted due to concern of coughing up foreign material concerning for  part of the ETT. She underwent direct laryngoscopy/bronchoscopy/esophagoscopy on 03/04/17 revealing "Floppy epiglottis. Posterior vocal cord bilateral and posterior comissure eschar c/w intubation injury. Subglottic trachea essentially completely full of what appears to be chronic granulation tissue for a distance of 3-4 centimeters. A curvilinear foreign body possibly consistent with necrotic tracheal ring cartilage." She was left intubated after the case due to not being able to establish an adequate airway, and was transferred to [facility] where she underwent surgical tracheostomy (#^ DCT) on 03/09/19. While in OR, "they found glottis to be extremely edematous, fibrinous debris in posterior glottis and lining the trachea, and trachea very soft and necrotic anteriorly. Tracheostomy was difficult 2/2 patient's anatomy. Given extremely critical airway, patient sedating for 48 hours s/p trach for more maturation of tracheostomy tract. Per ENT recs,CT Surgery consultedfor further evaluation of possible aberrate innominate artery given critical airway status and risk for development of tracheo-innominate fistula. CTSrecommendingmaintaining strict BP control with goal SBP <140 mmHg, avoid over inflating tracheal cuff, and no indication for acute surgical intervention at this time. Rheumatology also consulted for AI/rheumatological process who ultimately feel that clinical presentation likely post-intubation complication."  She was discharged to [facility]. On 05/18/17 she underwent repeat suspension mircrodirect laryngoscopy (SMDL) but noted to have near complete subglottic stenosis with only minimal improvement s/p dilation. CT scan revealed the segment of scarring to be quite thick. She was referred to Dr. Lianne Moris for potential tracheal resection, but bronchoscopy revealed the stenotic segment to to be too close to the cords to allow for resection. She then underwent attempted resection and reconstruction with Dr. 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 Per operative note, "Unfortunately, she has a difficult problem which is even made more difficult by the length of the stenosis, body habitus, high riding innominate and uncontrolled diabetes. She remains a poor candidate for cricotracheal resection and reanastamosis." Should she desire definitive resection and trach decannulation in the future, she would need to go to the [place] or [place].  She was last seen by ENT Dr. Jerilee Hoh at [facility] on 01/28/21. Per note, "She continues to do well largely without issues with her indwelling trach. She continues to tolerate finger plugging for short periods of time - she has to take her inner cannula out with the new style trach. Remains disinterested in [place] or additional humidification accessories. Using home humidification with some frequency - husband adds "not enough". No pneumonia or swallowing troubles." Note states she is using a "new style of 4UN65R" trach. This is replaced every 3 months.   Documentation from replacement 01/28/21: Procedures:  1) Tracheostomy Change FINDINGS:  Mature stoma, stomal collapses upon removal of trach - as last time. Patient tolerates without issue  but can be very anxious 4UN65R with lubrication placed without difficultly  Though anxious, the change went smoothly with reassurance.  PROCEDURE DETAILS: The patient was positioned semirecumbent. The velcro trach ties were released. The indwelling 4UN65R CFS trach was removed and replaced with a new 4UN65R. The new trach slid in easily. A new velcro trach tie was secured in place and confirmed to be snug. The patient tolerated the procedure well and without complications.   Procedure Note -Transnasal fiberoptic  tracheoscopy Surgeon: Sabino Snipes, M.D. Anesthesia: Topical 0.05% oxymetazoline with 4% lidocaine  Estimated Blood Loss: 0 mL Complications: None   Description of Procedure: With the patient in the sitting position (prior to trach change).The scope was passed through the indwelling trach. It was then removed. The patient tolerated the procedure well. Given no to minimal change over time on laryngoscopy, patient elected to have this performed every other visit, especially given limited interest in larger reconstructive surgery.  Findings:  Considerable dry mucosa and crusting of the trachea, modestly improved from last visit, otherwise widely patent distal trachea. Well-seated tracheostomy tube. No granulation or irritation at distal end of the tracheostomy tube. No active bleeding or excoriations.  Prior:  Notable dryness and superficial crusting of trachea  Point of maximal abduction, larger than previously captured though remains extremely limited. Can appreciate left sided nature of granulation/scar tuft.  EKG 09/20/20: NSR. Rate 78.  Echo 06/04/17: Study Conclusions - Left ventricle: The cavity size was normal. Wall thickness was normal. Systolic function was normal. The estimated ejection fraction was in the range of 60% to 65%. Wall motion was normal; there were no regional wall motion abnormalities. Doppler parameters are consistent with abnormal left ventricular relaxation (grade 1 diastolic dysfunction). - Mitral valve: Valve area by pressure half-time: 0.72 cm^2.  )        Anesthesia Quick Evaluation

## 2021-02-22 ENCOUNTER — Ambulatory Visit (HOSPITAL_COMMUNITY): Payer: 59 | Admitting: Physician Assistant

## 2021-02-22 ENCOUNTER — Ambulatory Visit (HOSPITAL_COMMUNITY)
Admission: RE | Admit: 2021-02-22 | Discharge: 2021-02-22 | Disposition: A | Discharge status: Home / Self Care | Payer: 59 | Attending: Oral Surgery | Admitting: Oral Surgery

## 2021-02-22 ENCOUNTER — Encounter (HOSPITAL_COMMUNITY): Payer: Self-pay | Admitting: Oral Surgery

## 2021-02-22 ENCOUNTER — Encounter (HOSPITAL_COMMUNITY)
Admission: RE | Disposition: A | Discharge status: Home / Self Care | Payer: Self-pay | Source: Home / Self Care | Attending: Oral Surgery

## 2021-02-22 ENCOUNTER — Other Ambulatory Visit: Payer: Self-pay

## 2021-02-22 DIAGNOSIS — Z79891 Long term (current) use of opiate analgesic: Secondary | ICD-10-CM | POA: Diagnosis not present

## 2021-02-22 DIAGNOSIS — J398 Other specified diseases of upper respiratory tract: Secondary | ICD-10-CM | POA: Insufficient documentation

## 2021-02-22 DIAGNOSIS — Z930 Tracheostomy status: Secondary | ICD-10-CM | POA: Insufficient documentation

## 2021-02-22 DIAGNOSIS — Z794 Long term (current) use of insulin: Secondary | ICD-10-CM | POA: Insufficient documentation

## 2021-02-22 DIAGNOSIS — Z79899 Other long term (current) drug therapy: Secondary | ICD-10-CM | POA: Insufficient documentation

## 2021-02-22 DIAGNOSIS — Z87891 Personal history of nicotine dependence: Secondary | ICD-10-CM | POA: Diagnosis not present

## 2021-02-22 DIAGNOSIS — Z7951 Long term (current) use of inhaled steroids: Secondary | ICD-10-CM | POA: Insufficient documentation

## 2021-02-22 DIAGNOSIS — E785 Hyperlipidemia, unspecified: Secondary | ICD-10-CM | POA: Insufficient documentation

## 2021-02-22 DIAGNOSIS — I10 Essential (primary) hypertension: Secondary | ICD-10-CM | POA: Insufficient documentation

## 2021-02-22 DIAGNOSIS — G8929 Other chronic pain: Secondary | ICD-10-CM | POA: Diagnosis not present

## 2021-02-22 DIAGNOSIS — E1065 Type 1 diabetes mellitus with hyperglycemia: Secondary | ICD-10-CM | POA: Diagnosis not present

## 2021-02-22 DIAGNOSIS — G4733 Obstructive sleep apnea (adult) (pediatric): Secondary | ICD-10-CM | POA: Diagnosis not present

## 2021-02-22 DIAGNOSIS — J449 Chronic obstructive pulmonary disease, unspecified: Secondary | ICD-10-CM | POA: Insufficient documentation

## 2021-02-22 DIAGNOSIS — K219 Gastro-esophageal reflux disease without esophagitis: Secondary | ICD-10-CM | POA: Insufficient documentation

## 2021-02-22 DIAGNOSIS — K047 Periapical abscess without sinus: Secondary | ICD-10-CM | POA: Diagnosis not present

## 2021-02-22 HISTORY — PX: TOOTH EXTRACTION: SHX859

## 2021-02-22 LAB — BASIC METABOLIC PANEL
Anion gap: 5 (ref 5–15)
BUN: 21 mg/dL — ABNORMAL HIGH (ref 6–20)
CO2: 23 mmol/L (ref 22–32)
Calcium: 9 mg/dL (ref 8.9–10.3)
Chloride: 109 mmol/L (ref 98–111)
Creatinine, Ser: 1.55 mg/dL — ABNORMAL HIGH (ref 0.44–1.00)
GFR, Estimated: 40 mL/min — ABNORMAL LOW (ref >60)
Glucose, Bld: 163 mg/dL — ABNORMAL HIGH (ref 70–99)
Potassium: 3.9 mmol/L (ref 3.5–5.1)
Sodium: 137 mmol/L (ref 135–145)

## 2021-02-22 LAB — CBC
HCT: 36.4 % (ref 36.0–46.0)
Hemoglobin: 11.6 g/dL — ABNORMAL LOW (ref 12.0–15.0)
MCH: 28.3 pg (ref 26.0–34.0)
MCHC: 31.9 g/dL (ref 30.0–36.0)
MCV: 88.8 fL (ref 80.0–100.0)
Platelets: 308 10*3/uL (ref 150–400)
RBC: 4.1 MIL/uL (ref 3.87–5.11)
RDW: 13.6 % (ref 11.5–15.5)
WBC: 6.6 10*3/uL (ref 4.0–10.5)
nRBC: 0 % (ref 0.0–0.2)

## 2021-02-22 LAB — GLUCOSE, CAPILLARY
Glucose-Capillary: 179 mg/dL — ABNORMAL HIGH (ref 70–99)
Glucose-Capillary: 178 mg/dL — ABNORMAL HIGH (ref 70–99)

## 2021-02-22 SURGERY — DENTAL RESTORATION/EXTRACTIONS
Anesthesia: General

## 2021-02-22 MED ORDER — ONDANSETRON HCL 4 MG/2ML IJ SOLN
INTRAMUSCULAR | Status: AC
  Filled 2021-02-22: qty 2

## 2021-02-22 MED ORDER — DEXAMETHASONE SODIUM PHOSPHATE 10 MG/ML IJ SOLN
INTRAMUSCULAR | Status: DC | PRN
  Administered 2021-02-22: 10 mg via INTRAVENOUS

## 2021-02-22 MED ORDER — PROPOFOL 10 MG/ML IV BOLUS
INTRAVENOUS | Status: AC
  Filled 2021-02-22: qty 20

## 2021-02-22 MED ORDER — MIDAZOLAM HCL 2 MG/2ML IJ SOLN
INTRAMUSCULAR | Status: AC
  Filled 2021-02-22: qty 2

## 2021-02-22 MED ORDER — ROCURONIUM BROMIDE 10 MG/ML (PF) SYRINGE
PREFILLED_SYRINGE | INTRAVENOUS | Status: AC
  Filled 2021-02-22: qty 10

## 2021-02-22 MED ORDER — ONDANSETRON HCL 4 MG/2ML IJ SOLN
INTRAMUSCULAR | Status: DC | PRN
  Administered 2021-02-22: 4 mg via INTRAVENOUS

## 2021-02-22 MED ORDER — LIDOCAINE 2% (20 MG/ML) 5 ML SYRINGE
INTRAMUSCULAR | Status: AC
  Filled 2021-02-22: qty 5

## 2021-02-22 MED ORDER — CHLORHEXIDINE GLUCONATE 0.12 % MT SOLN: 15.0000 mL | Freq: Once | OROMUCOSAL | Status: AC

## 2021-02-22 MED ORDER — SUCCINYLCHOLINE CHLORIDE 200 MG/10ML IV SOSY
PREFILLED_SYRINGE | INTRAVENOUS | Status: DC | PRN
  Administered 2021-02-22: 140 mg via INTRAVENOUS

## 2021-02-22 MED ORDER — OXYCODONE HCL 5 MG PO TABS: 5.0000 mg | ORAL_TABLET | Freq: Four times a day (QID) | ORAL | 0 refills | Status: AC | PRN

## 2021-02-22 MED ORDER — EPHEDRINE SULFATE-NACL 50-0.9 MG/10ML-% IV SOSY
PREFILLED_SYRINGE | INTRAVENOUS | Status: DC | PRN
  Administered 2021-02-22: 10 mg via INTRAVENOUS

## 2021-02-22 MED ORDER — ACETAMINOPHEN 500 MG PO TABS
1000.0000 mg | ORAL_TABLET | Freq: Once | ORAL | Status: AC
  Administered 2021-02-22: 1000 mg via ORAL
  Filled 2021-02-22: qty 2

## 2021-02-22 MED ORDER — LIDOCAINE-EPINEPHRINE 2 %-1:100000 IJ SOLN
INTRAMUSCULAR | Status: DC | PRN
  Administered 2021-02-22: 6 mL

## 2021-02-22 MED ORDER — AMOXICILLIN 500 MG PO CAPS: 500.0000 mg | ORAL_CAPSULE | Freq: Three times a day (TID) | ORAL | 0 refills | Status: DC

## 2021-02-22 MED ORDER — DEXAMETHASONE SODIUM PHOSPHATE 10 MG/ML IJ SOLN
INTRAMUSCULAR | Status: AC
  Filled 2021-02-22: qty 1

## 2021-02-22 MED ORDER — ORAL CARE MOUTH RINSE: 15.0000 mL | Freq: Once | OROMUCOSAL | Status: AC

## 2021-02-22 MED ORDER — FENTANYL CITRATE (PF) 100 MCG/2ML IJ SOLN: 25.0000 ug | INTRAMUSCULAR | Status: DC | PRN

## 2021-02-22 MED ORDER — PROPOFOL 10 MG/ML IV BOLUS
INTRAVENOUS | Status: DC | PRN
  Administered 2021-02-22: 130 mg via INTRAVENOUS

## 2021-02-22 MED ORDER — FENTANYL CITRATE (PF) 250 MCG/5ML IJ SOLN
INTRAMUSCULAR | Status: AC
  Filled 2021-02-22: qty 5

## 2021-02-22 MED ORDER — FENTANYL CITRATE (PF) 250 MCG/5ML IJ SOLN
INTRAMUSCULAR | Status: DC | PRN
  Administered 2021-02-22: 50 ug via INTRAVENOUS

## 2021-02-22 MED ORDER — PHENYLEPHRINE 40 MCG/ML (10ML) SYRINGE FOR IV PUSH (FOR BLOOD PRESSURE SUPPORT)
PREFILLED_SYRINGE | INTRAVENOUS | Status: DC | PRN
  Administered 2021-02-22: 200 ug via INTRAVENOUS
  Administered 2021-02-22: 80 ug via INTRAVENOUS
  Administered 2021-02-22: 120 ug via INTRAVENOUS

## 2021-02-22 MED ORDER — ALBUTEROL SULFATE HFA 108 (90 BASE) MCG/ACT IN AERS
INHALATION_SPRAY | RESPIRATORY_TRACT | Status: DC | PRN
  Administered 2021-02-22 (×2): 2 via RESPIRATORY_TRACT

## 2021-02-22 MED ORDER — LACTATED RINGERS IV SOLN: INTRAVENOUS | Status: DC

## 2021-02-22 MED ORDER — LIDOCAINE 2% (20 MG/ML) 5 ML SYRINGE
INTRAMUSCULAR | Status: DC | PRN
  Administered 2021-02-22: 60 mg via INTRAVENOUS

## 2021-02-22 MED ORDER — CELECOXIB 200 MG PO CAPS
200.0000 mg | ORAL_CAPSULE | Freq: Once | ORAL | Status: AC
  Administered 2021-02-22: 200 mg via ORAL
  Filled 2021-02-22: qty 1

## 2021-02-22 MED ORDER — 0.9 % SODIUM CHLORIDE (POUR BTL) OPTIME
TOPICAL | Status: DC | PRN
  Administered 2021-02-22: 1000 mL

## 2021-02-22 MED ORDER — CEFAZOLIN SODIUM-DEXTROSE 2-4 GM/100ML-% IV SOLN
2.0000 g | INTRAVENOUS | Status: AC
  Administered 2021-02-22: 2 g via INTRAVENOUS
  Filled 2021-02-22: qty 100

## 2021-02-22 MED ORDER — LIDOCAINE-EPINEPHRINE 2 %-1:100000 IJ SOLN
INTRAMUSCULAR | Status: AC
  Filled 2021-02-22: qty 1

## 2021-02-22 MED ORDER — MIDAZOLAM HCL 2 MG/2ML IJ SOLN
INTRAMUSCULAR | Status: DC | PRN
  Administered 2021-02-22: 2 mg via INTRAVENOUS

## 2021-02-22 MED ORDER — CHLORHEXIDINE GLUCONATE 0.12 % MT SOLN
OROMUCOSAL | Status: AC
  Administered 2021-02-22: 15 mL via OROMUCOSAL
  Filled 2021-02-22: qty 15

## 2021-02-22 SURGICAL SUPPLY — 35 items
BLADE SURG 15 STRL LF DISP TIS (BLADE) ×1 IMPLANT
BLADE SURG 15 STRL SS (BLADE) ×2
BUR CROSS CUT FISSURE 1.6 (BURR) ×2 IMPLANT
BUR EGG ELITE 4.0 (BURR) ×1 IMPLANT
CANISTER SUCT 3000ML PPV (MISCELLANEOUS) ×2 IMPLANT
COVER SURGICAL LIGHT HANDLE (MISCELLANEOUS) ×2 IMPLANT
COVER WAND RF STERILE (DRAPES) IMPLANT
DRAPE U-SHAPE 76X120 STRL (DRAPES) ×2 IMPLANT
GAUZE PACKING FOLDED 2  STR (GAUZE/BANDAGES/DRESSINGS) ×2
GAUZE PACKING FOLDED 2 STR (GAUZE/BANDAGES/DRESSINGS) ×1 IMPLANT
GLOVE BIO SURGEON STRL SZ 6.5 (GLOVE) IMPLANT
GLOVE BIO SURGEON STRL SZ7 (GLOVE) IMPLANT
GLOVE BIO SURGEON STRL SZ8 (GLOVE) ×2 IMPLANT
GLOVE SURG UNDER POLY LF SZ6.5 (GLOVE) IMPLANT
GLOVE SURG UNDER POLY LF SZ7 (GLOVE) IMPLANT
GOWN STRL REUS W/ TWL LRG LVL3 (GOWN DISPOSABLE) ×1 IMPLANT
GOWN STRL REUS W/ TWL XL LVL3 (GOWN DISPOSABLE) ×1 IMPLANT
GOWN STRL REUS W/TWL LRG LVL3 (GOWN DISPOSABLE) ×2
GOWN STRL REUS W/TWL XL LVL3 (GOWN DISPOSABLE) ×2
IV NS 1000ML (IV SOLUTION) ×2
IV NS 1000ML BAXH (IV SOLUTION) ×1 IMPLANT
KIT BASIN OR (CUSTOM PROCEDURE TRAY) ×2 IMPLANT
KIT TURNOVER KIT B (KITS) ×2 IMPLANT
NDL HYPO 25GX1X1/2 BEV (NEEDLE) ×2 IMPLANT
NEEDLE HYPO 25GX1X1/2 BEV (NEEDLE) ×4 IMPLANT
NS IRRIG 1000ML POUR BTL (IV SOLUTION) ×2 IMPLANT
PAD ARMBOARD 7.5X6 YLW CONV (MISCELLANEOUS) ×2 IMPLANT
SLEEVE IRRIGATION ELITE 7 (MISCELLANEOUS) ×2 IMPLANT
SPONGE SURGIFOAM ABS GEL 12-7 (HEMOSTASIS) IMPLANT
SUT CHROMIC 3 0 PS 2 (SUTURE) ×2 IMPLANT
SYR BULB IRRIG 60ML STRL (SYRINGE) ×2 IMPLANT
SYR CONTROL 10ML LL (SYRINGE) ×2 IMPLANT
TRAY ENT MC OR (CUSTOM PROCEDURE TRAY) ×2 IMPLANT
TUBING IRRIGATION (MISCELLANEOUS) ×2 IMPLANT
YANKAUER SUCT BULB TIP NO VENT (SUCTIONS) ×2 IMPLANT

## 2021-02-22 NOTE — Transfer of Care (Signed)
Immediate Anesthesia Transfer of Care Note  Patient: [name]  Procedure(s) Performed: DENTAL RESTORATION/EXTRACTIONS (N/A )  Patient Location: PACU  Anesthesia Type:General  Level of Consciousness: drowsy and patient cooperative  Airway & Oxygen Therapy: Patient Spontanous Breathing and Patient connected to tracheostomy mask oxygen  Post-op Assessment: Report given to RN and Post -op Vital signs reviewed and stable  Post vital signs: Reviewed and stable  Last Vitals:  Vitals Value Taken Time  BP 93/57 02/22/21 1104  Temp    Pulse 78 02/22/21 1106  Resp 14 02/22/21 1106  SpO2 94 % 02/22/21 1106  Vitals shown include unvalidated device data.  Last Pain:  Vitals:   02/22/21 0834  TempSrc:   PainSc: 8       Patients Stated Pain Goal: 3 (43/88/87 5797)  Complications: No complications documented.

## 2021-02-22 NOTE — Op Note (Signed)
02/22/2021  10:51 AM  PATIENT:  [name]  50 y.o. female  PRE-OPERATIVE DIAGNOSIS:  ABSCESSED TEETH # 4, 20 POST-OPERATIVE DIAGNOSIS:  SAME  PROCEDURE:  Procedure(s): DENTAL EXTRACTIONS TEETH # 4, 20  SURGEON:  Surgeon(s): Diona Browner, DMD  ANESTHESIA:   local and general  EBL:  minimal  DRAINS: none   SPECIMEN:  No Specimen  COUNTS:  YES  PLAN OF CARE: Discharge to home after PACU  PATIENT DISPOSITION:  PACU - hemodynamically stable.   PROCEDURE DETAILS: Dictation # [id number]  Gae Bon, DMD 02/22/2021 10:51 AM

## 2021-02-22 NOTE — H&P (Signed)
H&P documentation  -History and Physical Reviewed  -Patient has been re-examined  -No change in the plan of care  [name]  

## 2021-02-22 NOTE — Op Note (Signed)
NAME: [name], [name] MEDICAL RECORD NO: [id number] ACCOUNT NO: [id number] DATE OF BIRTH: 12-Aug-1971 FACILITY: [facility] LOCATION: [facility] PHYSICIAN: Gae Bon, DDS  Operative Report   DATE OF PROCEDURE: 02/22/2021  PREOPERATIVE DIAGNOSIS:  Abscessed teeth numbers 4 and 20.  POSTOPERATIVE DIAGNOSIS:  Abscessed teeth numbers 4 and 20.  PROCEDURE:  Extraction teeth numbers 4 and 20.  SURGEON:  Gae Bon, DDS  ANESTHESIA:  General via trach.  DESCRIPTION OF PROCEDURE:  The patient was taken to the operating room and placed on the table in supine position.  General anesthesia was administered.  The patient was intubated via pre-existing trach site, and the patient was draped for surgery.  A  timeout was performed.  The posterior pharynx was suctioned, and a throat pack was placed.  2% lidocaine with 1:100,000 epinephrine was infiltrated in an inferior alveolar block on the left side and buccal and palatal infiltration on tooth number 4.   Bite block was placed in the right side of the mouth. The sweetheart retractor was used to retract the tongue.  A #15 blade was used to make an incision in the mucosa overlying tooth number 20.  The periosteum was reflected with a periosteal elevator.   Bone was removed around the root of the tooth number 20 with the fissure bur and Stryker handpiece under irrigation.  The tooth was then elevated and removed in fragments.  The apical abscess area was curetted, and then the area was irrigated and closed  with 3-0 chromic.  The bite block and sweetheart retractor were repositioned to the other side of the mouth.  A 15 blade was used to make an incision around tooth number 4.  The periosteum was elevated with periosteal elevator.  Bone was removed with a  Stryker handpiece and fissure bur under irrigation to allow access to the root.  Then, the root was removed using a 301 elevator.  The socket was curetted and irrigated, and the area was  closed with 3-0 chromic.  The oral cavity was then irrigated and  suctioned.  The throat pack was removed.  The patient was left in the care of anesthesia for extubation and transport to recovery with plans for discharge home through day surgery.  ESTIMATED BLOOD LOSS:  Minimum.  COMPLICATIONS:  None.  SPECIMENS:  None.   [facility] D: 02/22/2021 10:54:46 am T: 02/22/2021 11:41:00 am  JOB: [id number]/ [id number]

## 2021-02-22 NOTE — Anesthesia Procedure Notes (Signed)
Procedure Name: Intubation Date/Time: 02/22/2021 10:33 AM Performed by: Lance Coon, CRNA Pre-anesthesia Checklist: Patient identified, Emergency Drugs available, Suction available, Patient being monitored and Timeout performed Patient Re-evaluated:Patient Re-evaluated prior to induction Oxygen Delivery Method: Circle system utilized Preoxygenation: Pre-oxygenation with 100% oxygen Induction Type: IV induction Tube type: Oral Tube size: 5.5 mm Placement Confirmation: positive ETCO2 and breath sounds checked- equal and bilateral Tube secured with: Tape Dental Injury: Teeth and Oropharynx as per pre-operative assessment  Comments: Uncuffed shiley trach removed and reinforced ett place for procedure, to trauma to stoma site noted

## 2021-02-22 NOTE — Anesthesia Postprocedure Evaluation (Signed)
Anesthesia Post Note  Patient: [name]  Procedure(s) Performed: DENTAL RESTORATION/EXTRACTIONS (N/A )     Patient location during evaluation: PACU Anesthesia Type: General Level of consciousness: sedated Pain management: pain level controlled Vital Signs Assessment: post-procedure vital signs reviewed and stable Respiratory status: spontaneous breathing and respiratory function stable Cardiovascular status: stable Postop Assessment: no apparent nausea or vomiting Anesthetic complications: no   No complications documented.  Last Vitals:  Vitals:   02/22/21 1250 02/22/21 1320  BP: 135/85 140/87  Pulse: 77 75  Resp: 12 13  Temp:    SpO2: 96% 96%    Last Pain:  Vitals:   02/22/21 1300  TempSrc:   PainSc: Asleep                 Will Schier DANIEL

## 2021-02-22 NOTE — Anesthesia Preprocedure Evaluation (Signed)
Anesthesia Evaluation    Reviewed: Allergy & Precautions, Patient's Chart, lab work & pertinent test results  History of Anesthesia Complications Negative for: history of anesthetic complications  Airway Mallampati: Trach       Dental   Pulmonary asthma , sleep apnea , COPD,  COPD inhaler, former smoker,  Tracheostomy in place since 02/2017  Tracheal stenosis   Pulmonary exam normal        Cardiovascular hypertension, Pt. on home beta blockers and Pt. on medications Normal cardiovascular exam  ECG: NSR, rate 94   Neuro/Psych  Headaches, PSYCHIATRIC DISORDERS Anxiety Depression    GI/Hepatic Neg liver ROS, GERD  ,  Endo/Other  diabetes, Insulin DependentMorbid obesity  Renal/GU negative Renal ROS     Musculoskeletal  (+) Arthritis ,   Abdominal (+) + obese,   Peds  Hematology HLD   Anesthesia Other Findings right shoulder rotator cuff tear, acromioclavicular osteoarthritis  Reproductive/Obstetrics                             Anesthesia Physical  Anesthesia Plan  ASA: III  Anesthesia Plan: General   Post-op Pain Management:    Induction: Intravenous  PONV Risk Score and Plan: 4 or greater and Midazolam, Dexamethasone, Ondansetron and Treatment may vary due to age or medical condition  Airway Management Planned: Tracheostomy  Additional Equipment:   Intra-op Plan:   Post-operative Plan:   Informed Consent: I have reviewed the patients History and Physical, chart, labs and discussed the procedure including the risks, benefits and alternatives for the proposed anesthesia with the patient or authorized representative who has indicated his/her understanding and acceptance.       Plan Discussed with: Anesthesiologist, Surgeon and CRNA  Anesthesia Plan Comments: (PAT note by Karoline Caldwell, PA-C: History includes former smoker (quit 01/2017), tracheostomy (03/08/17 following glottic  intubation injury and subsequent development of tracheal stenosis; seen Q 3 months at [facility] for management/trach exchange; if desires resection/decannulation would require evaluation at [facility] or [facility]) , OSA (no CPAP since trach), HTN, asthma, GERD, chronic pain, DM (insulin dependent), HLD, COPD, obesity.  Hx if asthma, not well controlled. Currently maintained on Dulera, Singulair, as needed duoneb. This is currently managed by her PCP Dr. Charlane Ferretti. Pt was recetnly referred to pulmonology for consideration of Dupixent. Has not been seen yet.   Type 1 diabetes, not well controlled. Last A1c 8.5 on 11/16/20.  Pt will need DOS labs and eval.   Details surrounding tracheostomy history: Admitted 02/18/17 - 02/27/17 to [facility] with acute respiratory failure due to asthma exacerbation and required intubation (02/18/17-02/24/18). Also required CRRT for AKI with refractory hyperkalemia/worsening metabolic acidosis. On 03/03/17, ENT consulted due to concern of coughing up foreign material concerning for part of the ETT. She underwent direct laryngoscopy/bronchoscopy/esophagoscopy on 03/04/17 revealing "Floppy epiglottis. Posterior vocal cord bilateral and posterior comissure eschar c/w intubation injury. Subglottic trachea essentially completely full of what appears to be chronic granulation tissue for a distance of 3-4 centimeters. A curvilinear foreign body possibly consistent with necrotic tracheal ring cartilage." She was left intubated after the case due to not being able to establish an adequate airway, and was transferred to [facility] where she underwent surgical tracheostomy (#^ DCT) on 03/09/19. While in OR, "they found glottis to be extremely edematous, fibrinous debris in posterior glottis and lining the trachea, and trachea very soft and necrotic anteriorly. Tracheostomy was difficult 2/2 patient's anatomy. Given extremely critical airway, patient  sedating for 48 hours s/p trach for  more maturation of tracheostomy tract. Per ENT recs,CT Surgery consultedfor further evaluation of possible aberrate innominate artery given critical airway status and risk for development of tracheo-innominate fistula. CTSrecommendingmaintaining strict BP control with goal SBP <140 mmHg, avoid over inflating tracheal cuff, and no indication for acute surgical intervention at this time. Rheumatology also consulted for AI/rheumatological process who ultimately feel that clinical presentation likely post-intubation complication."  She was discharged to [facility]. On 05/18/17 she underwent repeat suspension mircrodirect laryngoscopy (SMDL) but noted to have near complete subglottic stenosis with only minimal improvement s/p dilation. CT scan revealed the segment of scarring to be quite thick. She was referred to Dr. Lianne Moris for potential tracheal resection, but bronchoscopy revealed the stenotic segment to to be too close to the cords to allow for resection. She then underwent attempted resection and reconstruction with Dr. Hendricks Limes, however due to the long segment and intimate relationship to her high riding innominate artery, the procedure was aborted." Last tracheal dilation seen was from 10/30/17 (unable to creat a reasonable opening to then safely ablate the scar). Per operative note, "Unfortunately, she has a difficult problem which is even made more difficult by the length of the stenosis, body habitus, high riding innominate and uncontrolled diabetes. She remains a poor candidate for cricotracheal resection and reanastamosis." Should she desire definitive resection and trach decannulation in the future, she would need to go to the [place] or [place].  She was last seen by ENT Dr. Jerilee Hoh at [facility] on 01/28/21. Per note, "She continues to do well largely without issues with her indwelling trach. She continues to tolerate finger plugging for short periods of time - she has to take her inner cannula  out with the new style trach. Remains disinterested in [place] or additional humidification accessories. Using home humidification with some frequency - husband adds "not enough". No pneumonia or swallowing troubles." Note states she is using a "new style of 4UN65R" trach. This is replaced every 3 months.   Documentation from replacement 01/28/21: Procedures:  1) Tracheostomy Change FINDINGS:  Mature stoma, stomal collapses upon removal of trach - as last time. Patient tolerates without issue but can be very anxious 4UN65R with lubrication placed without difficultly  Though anxious, the change went smoothly with reassurance.  PROCEDURE DETAILS: The patient was positioned semirecumbent. The velcro trach ties were released. The indwelling 4UN65R CFS trach was removed and replaced with a new 4UN65R. The new trach slid in easily. A new velcro trach tie was secured in place and confirmed to be snug. The patient tolerated the procedure well and without complications.   Procedure Note -Transnasal fiberoptic tracheoscopy Surgeon: Sabino Snipes, M.D. Anesthesia: Topical 0.05% oxymetazoline with 4% lidocaine  Estimated Blood Loss: 0 mL Complications: None   Description of Procedure: With the patient in the sitting position (prior to trach change).The scope was passed through the indwelling trach. It was then removed. The patient tolerated the procedure well. Given no to minimal change over time on laryngoscopy, patient elected to have this performed every other visit, especially given limited interest in larger reconstructive surgery.  Findings:  Considerable dry mucosa and crusting of the trachea, modestly improved from last visit, otherwise widely patent distal trachea. Well-seated tracheostomy tube. No granulation or irritation at distal end of the tracheostomy tube. No active bleeding or excoriations.  Prior:  Notable dryness and superficial crusting of trachea  Point of maximal abduction, larger  than previously captured though remains extremely  limited. Can appreciate left sided nature of granulation/scar tuft.  EKG 09/20/20: NSR. Rate 78.  Echo 06/04/17: Study Conclusions - Left ventricle: The cavity size was normal. Wall thickness was normal. Systolic function was normal. The estimated ejection fraction was in the range of 60% to 65%. Wall motion was normal; there were no regional wall motion abnormalities. Doppler parameters are consistent with abnormal left ventricular relaxation (grade 1 diastolic dysfunction). - Mitral valve: Valve area by pressure half-time: 0.72 cm^2.   )        Anesthesia Quick Evaluation

## 2021-02-23 ENCOUNTER — Encounter (HOSPITAL_COMMUNITY): Payer: Self-pay | Admitting: Oral Surgery

## 2021-02-28 ENCOUNTER — Telehealth (HOSPITAL_BASED_OUTPATIENT_CLINIC_OR_DEPARTMENT_OTHER): Payer: 59 | Admitting: Family Medicine

## 2021-02-28 DIAGNOSIS — T2111XA Burn of first degree of chest wall, initial encounter: Secondary | ICD-10-CM | POA: Diagnosis not present

## 2021-02-28 DIAGNOSIS — G43119 Migraine with aura, intractable, without status migrainosus: Secondary | ICD-10-CM

## 2021-02-28 MED ORDER — TOPIRAMATE 100 MG PO TABS: 150.0000 mg | ORAL_TABLET | Freq: Two times a day (BID) | ORAL | 0 refills | Status: DC

## 2021-02-28 MED ORDER — SILVER SULFADIAZINE 1 % EX CREA: 1.0000 "application " | TOPICAL_CREAM | Freq: Every day | CUTANEOUS | 1 refills | Status: DC

## 2021-02-28 NOTE — Progress Notes (Signed)
Virtual Visit via Video Note  I connected with [name], on 02/28/2021 at 8:18 AM by video enabled telemedicine device due to the COVID-19 pandemic and verified that I am speaking with the correct person using two identifiers.   Consent: I discussed the limitations, risks, security and privacy concerns of performing an evaluation and management service by telemedicine and the availability of in person appointments. I also discussed with the patient that there may be a patient responsible charge related to this service. The patient expressed understanding and agreed to proceed.   Location of Patient: Home  Location of Provider: Clinic   Persons participating in Telemedicine visit: Lynnell Mae Younis Dr. Margarita Rana     History of Present Illness: [name]  is a 50 year old female with a history of type 1 diabetes mellitus (A1c 8.5), hypertension, obstructive sleep apnea, asthma, depression, s/p tracheostomy secondary to intubation related tracheal injury, subglottic stenosis (status post balloon dilatation and Kenalog injection of stenosis tracheostomy tube exchange by ENT, status post right rotator cuff surgery in 07/2019 seen for an acute visit.  She complains of a 4-day history of a burn on her right breast.  This occurred after she had fallen asleep on a heating pad she has been using for back pain and she has applied Neosporin ointment to it.  Denies presence of discharge but does have a blister. Her migraines have been uncontrolled despite compliance with Topamax 100 mg twice daily and she is needing to take Imitrex for breakthrough every day.  Initially thought it was related to her tooth which she has had extracted but pain still persist.  She has no nausea or vomiting.   Past Medical History:  Diagnosis Date   Allergy    Anxiety    Arthritis    Asthma    Chronic back pain    Chronic chest pain    resolved, no problems since  2019 per patient 07/27/19   COPD (chronic obstructive pulmonary disease) ([place])    Depression    DM (diabetes mellitus) ([place])    INSULIN DEPENDENT - TYPE 1   GERD (gastroesophageal reflux disease)    Headache(784.0)    Hyperlipidemia    no med, diet controlled   Hypertension    Hypokalemia    Respiratory disease 05/2017   uses inhalers, neb tx prn, no oxygen   Sleep apnea    does not use CPAP due to trach   Tracheostomy in place [name]) 02/2017   Allergies  Allergen Reactions   Reglan [Metoclopramide] Other (See Comments)    Panic attack    Current Outpatient Medications on File Prior to Visit  Medication Sig Dispense Refill   acetaminophen (TYLENOL) 325 MG tablet Take 1-2 tablets (325-650 mg total) by mouth every 6 (six) hours as needed for mild pain (pain score 1-3 or temp > 100.5). 60 tablet 0   albuterol (PROAIR HFA) 108 (90 Base) MCG/ACT inhaler Inhale 2 puffs into the lungs every 6 (six) hours as needed for wheezing or shortness of breath. 18 g 6   albuterol (PROVENTIL) (2.5 MG/3ML) 0.083% nebulizer solution Take 3 mLs (2.5 mg total) by nebulization every 6 (six) hours as needed for wheezing or shortness of breath. 75 mL 3   amoxicillin (AMOXIL) 500 MG capsule Take 1 capsule (500 mg total) by mouth 3 (three) times daily. 21 capsule 0   atorvastatin (LIPITOR) 20 MG tablet Take 1 tablet (20 mg total) by mouth daily. 90 tablet 1   Blood  Glucose Monitoring Suppl (ONETOUCH VERIO) w/Device KIT 1 each by Does not apply route daily. Use to monitor glucose levels daily; E11.9 1 kit 0   buPROPion (WELLBUTRIN SR) 150 MG 12 hr tablet Take 1 tablet (150 mg total) by mouth 2 (two) times daily. 180 tablet 1   dapagliflozin propanediol (FARXIGA) 5 MG TABS tablet Take 1 tablet (5 mg total) by mouth daily before breakfast. 30 tablet 3   diclofenac Sodium (VOLTAREN) 1 % GEL Apply 4 g topically 4 (four) times daily. 100 g 1   furosemide (LASIX) 20 MG tablet Take 1 tablet (20 mg total) by mouth daily.  30 tablet 2   gabapentin (NEURONTIN) 300 MG capsule Take 2 capsules (600 mg total) by mouth 2 (two) times daily. 360 capsule 1   glucose blood (ONETOUCH VERIO) test strip Use to monitor glucose levels daily; E11.9 100 each 2   hydrOXYzine (ATARAX/VISTARIL) 25 MG tablet Take 1 tablet (25 mg total) by mouth 3 (three) times daily as needed for anxiety. 180 tablet 1   insulin degludec (TRESIBA FLEXTOUCH) 200 UNIT/ML FlexTouch Pen Inject 150 Units into the skin daily. 24 mL 3   Insulin Pen Needle 31G X 8 MM MISC Inject 1 Units as directed daily.     Melatonin 3 MG TABS Take 3 mg by mouth at bedtime as needed (for sleep).      methocarbamol (ROBAXIN) 750 MG tablet Take 750 mg by mouth 3 (three) times daily.     Misc. Devices MISC Nebulizer device. Diagnosis - Asthma 1 each 0   Misc. Devices [employer].  Diagnosis-low back pain 1 each 0   mometasone-formoterol (DULERA) 200-5 MCG/ACT AERO Inhale 2 puffs into the lungs in the morning and at bedtime. 1 each 6   ondansetron (ZOFRAN) 4 MG tablet Take 1 tablet (4 mg total) by mouth every 8 (eight) hours as needed for nausea or vomiting. 30 tablet 0   OneTouch Delica Lancets 38H MISC 1 each by Does not apply route daily. Use to monitor glucose levels daily; E11.9 100 each 2   oxyCODONE-acetaminophen (PERCOCET) 10-325 MG tablet Take 1 tablet by mouth 4 (four) times daily as needed for pain.      pantoprazole (PROTONIX) 40 MG tablet Take 1 tablet (40 mg total) by mouth 2 (two) times daily. 60 tablet 0   polyethylene glycol (MIRALAX) 17 g packet Take 17 g by mouth daily. 14 each 0   Semaglutide, 1 MG/DOSE, (OZEMPIC, 1 MG/DOSE,) 2 MG/1.5ML SOPN Inject 1 mg into the skin once a week. 9 mL 3   sertraline (ZOLOFT) 100 MG tablet Take 1 tablet (100 mg total) by mouth at bedtime. 90 tablet 1   sucralfate (CARAFATE) 1 g tablet Take 1 tablet (1 g total) by mouth 4 (four) times daily -  with meals and at bedtime. 120 tablet 1   SUMAtriptan (IMITREX) 100 MG tablet At the  onset of a migraine; may repeat in 2 hours. Max daily dose 224m (Patient taking differently: Take 100 mg by mouth every 2 (two) hours as needed for migraine.) 20 tablet 6   tiZANidine (ZANAFLEX) 4 MG tablet Take 4 mg by mouth every 8 (eight) hours as needed for muscle spasms.     topiramate (TOPAMAX) 100 MG tablet Take 1 tablet (100 mg total) by mouth 2 (two) times daily. 180 tablet 1   [DISCONTINUED] fluticasone-salmeterol (ADVAIR HFA) 230-21 MCG/ACT inhaler Inhale 2 puffs into the lungs 2 (two) times daily. 1 Inhaler 3  No current facility-administered medications on file prior to visit.    ROS: See HPI   Observations/Objective: Awake, alert, oriented x3 Tracheostomy tube in place Not in acute distress Right breast with blister at 8 o'clock.  No discharge or ulceration noted, no erythema. Normal mood  Assessment and Plan: 1. Intractable migraine with aura without status migrainosus Uncontrolled Increased dose of Topamax - topiramate (TOPAMAX) 100 MG tablet; Take 1.5 tablets (150 mg total) by mouth 2 (two) times daily.  Dispense: 90 tablet; Refill: 0 - Ambulatory referral to Neurology  2. Superficial burn of breast, initial encounter Advised that if blister pops she should apply dressing No antibiotic indicated at this time - silver sulfADIAZINE (SILVADENE) 1 % cream; Apply 1 application topically daily.  Dispense: 50 g; Refill: 1   Follow Up Instructions: Keep previously scheduled appointment   I discussed the assessment and treatment plan with the patient. The patient was provided an opportunity to ask questions and all were answered. The patient agreed with the plan and demonstrated an understanding of the instructions.   The patient was advised to call back or seek an in-person evaluation if the symptoms worsen or if the condition fails to improve as anticipated.     I provided 16 minutes total of Telehealth time during this encounter including median intraservice  time, reviewing previous notes, investigations, ordering medications, medical decision making, coordinating care and patient verbalized understanding at the end of the visit.     [name], MD, FAAFP. [facility] and [facility], [place]   02/28/2021, 8:18 AM

## 2021-02-28 NOTE — Patient Instructions (Signed)
Burn Care, Adult A burn is an injury to the skin or the tissues under the skin. There are three types of burns: First degree. These burns may cause the skin to be red and a bit swollen. Second degree. These burns are very painful and cause the skin to be very red. The skin may also swell, leak fluid, look shiny, and start to have blisters. Third degree. These burns cause lasting damage. They turn the skin white or black and make it look charred, dry, and leathery. Treatment for your burn will depend on the type of burn you have. Taking good care of your burn can help to prevent pain and infection. It can also help the burn heal quickly. How to care for a first-degree burn Right after a burn: Rinse or soak the burn under cool water for 5 minutes or more. Do not put ice on your burn. That can cause more damage. Put a cool, clean, wet cloth on your burn. Put lotion or gel with aloe vera on your burn. Caring for the burn Clean and care for your burn. Your doctor may tell you: To clean the burn using soap and water. To pat the burn dry using a clean cloth. Do not rub or scrub the burn. To put lotion or gel with aloe vera on your burn. How to care for a second-degree burn Right after a burn: Rinse or soak the burn under cool water. Do this for 5 to 10 minutes. Do not put ice on your burn. This can cause more damage. Remove any jewelry near the burned area. Cover the burn with a clean cloth. Caring for the burn Raise (elevate) the burned area above the level of your heart while sitting or lying down. Clean and care for your burn. Your doctor may tell you: To clean or rinse your burn. To put a cream or ointment on the burn. To place a germ-free (sterile) dressing over the burn. A dressing is a material that is placed on a burn to help it heal. How to care for a third-degree burn Right after a burn: Cover the burn with a clean, dry cloth. Seek treatment right away if you have this kind of burn.  You may: Need to stay in the hospital. Have surgery to remove burned tissue. Have surgery to put new skin on the burned area. Be given fluids through an IV tube. Caring for the burn Clean and care for your burn. Your doctor may tell you: To clean or rinse your burn. To put a cream or ointment on the burn. To put a sterile dressing in the burn. This is called packing. To place a sterile dressing over the burn. Other things to do Raise the burned area above the level of your heart while sitting or lying down. Wear splints or immobilizers if told by your doctor. Rest as told by your doctor. Do not do sports or other activities until your doctor approves. How to prevent infection when caring for a burn Take these steps to prevent infection: Wash your hands with soap and water for at least 20 seconds before and after caring for your burn. If you cannot use soap and water, use hand sanitizer. Wear clean or sterile gloves as told by your doctor. Do not put butter, oil, toothpaste, or other home remedies on the burn. Do not scratch or pick at the burn. Do not break any blisters. Do not peel the skin. Do not rub your burn, even when you  are cleaning it. Check your burn every day for these signs of infection: More redness, swelling, or pain. Warmth. Pus or a bad smell. Red streaks around the burn.   Follow these instructions at home Medicines Take over-the-counter and prescription medicines only as told by your doctor. If you were prescribed an antibiotic medicine, use it as told by your doctor. Do not stop using the antibiotic even if your condition gets better. Your doctor may ask you to take medicine for pain before you change your dressing. General instructions Protect your burn from the sun. Drink enough fluid to keep your pee (urine) pale yellow. Do not use any products that contain nicotine or tobacco, such as cigarettes, e-cigarettes, and chewing tobacco. These can delay healing.  If you need help quitting, ask your doctor. Keep all follow-up visits as told by your doctor. This is important.   Contact a doctor if: Your condition does not get better. Your condition gets worse. You have a fever or chills. Your burn feels warm to the touch. You have more redness, swelling, or pain on your burn. Your burn looks different or starts to have black or red spots on it. Your pain does not get better with medicine. Get help right away if: You have more fluid, blood, or pus coming from your burn. You have red streaks near the burn. You have very bad pain. Summary There are three types of burns. They are first degree, second degree, and third degree. Of these, a third-degree burn is most serious. This must be treated right away. Treatment for your burn will depend on the type of burn you have. Do not put butter, oil, toothpaste, or other home remedies on the burn. These things can damage your skin. Follow instructions from your doctor about how to clean and take care of your burn. This information is not intended to replace advice given to you by your health care provider. Make sure you discuss any questions you have with your health care provider. Document Revised: 10/28/2019 Document Reviewed: 06/28/2019 Elsevier Patient Education  [place].

## 2021-03-05 ENCOUNTER — Institutional Professional Consult (permissible substitution): Payer: 59 | Admitting: Internal Medicine

## 2021-03-06 ENCOUNTER — Other Ambulatory Visit: Payer: Self-pay

## 2021-03-06 ENCOUNTER — Encounter: Payer: Self-pay | Admitting: Internal Medicine

## 2021-03-06 ENCOUNTER — Ambulatory Visit (INDEPENDENT_AMBULATORY_CARE_PROVIDER_SITE_OTHER): Payer: 59 | Admitting: Internal Medicine

## 2021-03-06 VITALS — BP 122/74 | HR 103 | Temp 97.4°F | Ht 62.5 in | Wt 231.0 lb

## 2021-03-06 DIAGNOSIS — J301 Allergic rhinitis due to pollen: Secondary | ICD-10-CM | POA: Diagnosis not present

## 2021-03-06 DIAGNOSIS — J4550 Severe persistent asthma, uncomplicated: Secondary | ICD-10-CM

## 2021-03-06 DIAGNOSIS — G4733 Obstructive sleep apnea (adult) (pediatric): Secondary | ICD-10-CM | POA: Diagnosis not present

## 2021-03-06 DIAGNOSIS — Z9889 Other specified postprocedural states: Secondary | ICD-10-CM

## 2021-03-06 DIAGNOSIS — J398 Other specified diseases of upper respiratory tract: Secondary | ICD-10-CM

## 2021-03-06 MED ORDER — CETIRIZINE HCL 10 MG PO TABS: 10.0000 mg | ORAL_TABLET | Freq: Every day | ORAL | 5 refills | Status: DC

## 2021-03-06 NOTE — Progress Notes (Addendum)
[place]    [id number]    Jun 09, 1971  Primary Care Physician:Newlin, Charlane Ferretti, MD  Referring Physician: [name], MD [place],  [place] [id number] Reason for Consultation: asthma Date of Consultation: 03/06/2021  Chief complaint:   Chief Complaint  Patient presents with   Consult    Shortness of breath with activity and heat for past few years. Worse in the last year.      HPI: [name] is a 50 y.o. woman with severe persistent asthma, subglottic stenosis s/p tracheostomy after traumatic intubation in 2018 Has a #4 cuffless trach and is follwed by [facility]. Admitted for Covid 19 infection Dec 2021. Treated with remdesevir, no steroids. Discharged on room air. Has additional history of Type 1 DM, severe OSA and severe asthma diagnosed in childhood.   Here for asthma evaluation. She has shortness of breath with minimal exertion.  She has OSA and had a CPAP previously. On nothing since trach   Current Regimen: duelra 2 puffs twice a day prn albuterol Asthma Triggers: heat, minimal exertion, stress, talking Exacerbations in the last year: twice in the last year  History of hospitalization or intubation: yes, last in 2018 Allergy Testing: never had GERD: yes on PPI protonix Allergic Rhinitis: yes, during seasonal changes ACT:  Asthma Control Test ACT Total Score  03/06/2021 8   FeNO:  Social history:  Occupation: on disability, worked as a Quarry manager, worked in [employer], Education administrator Exposures: lives at home with husband, grandson, daughter Smoking history:  Social History   Occupational History   Not on file  Tobacco Use   Smoking status: Former    Packs/day: 0.50    Years: 25.00    Pack years: 12.50    Types: Cigarettes    Quit date: 01/21/2016    Years since quitting: 5.1   Smokeless tobacco: Never  Vaping Use   Vaping Use: Never used  Substance and Sexual Activity   Alcohol use: Yes     Alcohol/week: 0.0 standard drinks    Comment: social wine   Drug use: Yes    Types: Marijuana    Comment: last used 1 weeks ago   Sexual activity: Yes    Partners: Male    Birth control/protection: Surgical    Comment: Hysterectomy    Relevant family history: Family History  Problem Relation Age of Onset   Heart attack Mother    Stroke Mother    Diabetes Mother    Hypertension Mother    Arthritis Mother    Stroke Father    Asthma Sister    Hypertension Sister    Asthma Sister    Diabetes Sister    Asthma Brother    Seizures Brother    Asthma Brother    Diabetes Brother    Asthma Daughter    Asthma Son    Asthma Grandson    Colon cancer Neg Hx    Rectal cancer Neg Hx    Stomach cancer Neg Hx     Past Medical History:  Diagnosis Date   Allergy    Anxiety    Arthritis    Asthma    Chronic back pain    Chronic chest pain    resolved, no problems since 2019 per patient 07/27/19   COPD (chronic obstructive pulmonary disease) ([place])    Depression    DM (diabetes mellitus) ([place])    INSULIN DEPENDENT - TYPE 1   GERD (gastroesophageal  reflux disease)    Headache(784.0)    Hyperlipidemia    no med, diet controlled   Hypertension    Hypokalemia    Respiratory disease 05/2017   uses inhalers, neb tx prn, no oxygen   Sleep apnea    does not use CPAP due to trach   Tracheostomy in place [facility]) 02/2017    Past Surgical History:  Procedure Laterality Date   ABDOMINAL HYSTERECTOMY  2009   APPENDECTOMY     CESAREAN SECTION     x 3   CHOLECYSTECTOMY N/A 03/02/2014   Procedure: LAPAROSCOPIC CHOLECYSTECTOMY;  Surgeon: Joyice Faster. Cornett, MD;  Location: [place];  Service: General;  Laterality: N/A;   HERNIA REPAIR     PANENDOSCOPY N/A 03/04/2017   Procedure: PANENDOSCOPY WITH POSSIBLE FOREIGN BODY REMOVAL;  Surgeon: Jodi Marble, MD;  Location: [place];  Service: ENT;  Laterality: N/A;   ROTATOR CUFF REPAIR Left    ROTATOR CUFF REPAIR Right 07/27/2019   SHOULDER  ARTHROSCOPY WITH SUBACROMIAL DECOMPRESSION, ROTATOR CUFF REPAIR AND BICEP TENDON REPAIR Right 07/28/2019   Procedure: RIGHT SHOULDER ARTHROSCOP, MINI OPEN ROTATOR CUFF TEAR REPAIR,  BICEPS TENODESIS, DISTAL CLAVICLE EXCISION;  Surgeon: Meredith Pel, MD;  Location: [place];  Service: Orthopedics;  Laterality: Right;   TOOTH EXTRACTION N/A 02/22/2021   Procedure: DENTAL RESTORATION/EXTRACTIONS;  Surgeon: Diona Browner, DMD;  Location: [facility];  Service: Oral Surgery;  Laterality: N/A;   TRACHEOSTOMY  02/2017   TUBAL LIGATION     VESICOVAGINAL FISTULA CLOSURE W/ TAH  2009     Physical Exam: Blood pressure 122/74, pulse (!) 103, temperature (!) 97.4 F (36.3 C), temperature source Temporal, height 5' 2.5" (1.588 m), weight 231 lb (104.8 kg), SpO2 100 %. Gen:      No acute distress, obese ENT:  no nasal polyps, mucus membranes moist, mallampati IV, 4.0 shiley cuffless Lungs:    No increased respiratory effort, symmetric chest wall excursion, clear to auscultation bilaterally, no wheezes or crackles CV:         Regular rate and rhythm; no murmurs, rubs, or gallops.  No pedal edema Abd:      + bowel sounds; soft, non-tender; no distension MSK: no acute synovitis of DIP or PIP joints, no mechanics hands.  Skin:      Warm and dry; no rashes Neuro: normal speech, no focal facial asymmetry Psych: alert and oriented x3, normal mood and affect   Data Reviewed/Medical Decision Making:  Independent interpretation of tests: Imaging:  Review of patient's ct angio dec 2021 images revealed low lung volumes, mild bibasilar atelectasis, mild inflammatory ground glass consistent with covid. The patient's images have been independently reviewed by me.    PFTs: None on file No flowsheet data found.  Labs:  Lab Results  Component Value Date   WBC 6.6 02/22/2021   HGB 11.6 (L) 02/22/2021   HCT 36.4 02/22/2021   MCV 88.8 02/22/2021   PLT 308 02/22/2021   Lab Results  Component Value Date   NA 137  02/22/2021   K 3.9 02/22/2021   CL 109 02/22/2021   CO2 23 02/22/2021   No eosinophilia  Immunization status:  Immunization History  Administered Date(s) Administered   Influenza,inj,Quad PF,6+ Mos 06/05/2014, 08/01/2015, 07/02/2016, 06/19/2017   PFIZER(Purple Top)SARS-COV-2 Vaccination 08/29/2020   Pneumococcal Polysaccharide-23 04/29/2013, 02/21/2017     I reviewed prior external note(s) from hospital stays, prior PCCM inpatient notes  I reviewed the result(s) of the labs and imaging as noted above.  I have ordered PFTs  Discussion of management or test interpretation with another colleague.  Assessment:  Severe persistent asthma OSA Subglottic and tracheal stenosis  Allergic rhinitis  Plan/Recommendations: Her dyspnea is multifactorial from asthma, obesity and tracheostomy. Unfortunately she didn't tolerate capping trials or PMV. Therefore can't really get PFTs on her to evaluate her dyspnea.  Continue dulera and prn albuterol Her trach is likely alleviating her obstruction, but she still may be hypoxemic or hypercapnic. Will get overnight oximetry. Serum HCO3 is 23, so hypercapnia less likely Will start cetirizine in addition to montelukast for her rhinitis.  May consider biologics for asthma in the future Will need IgE and immunocap testing as well. Eos not elevated so far.   We discussed disease management and progression at length today.   I spent 45 minutes in the care of this patient today including pre-charting, chart review, review of results, face-to-face care, coordination of care and communication with consultants etc.).  Return to Care: Return in about 3 months (around 06/06/2021).  Lenice Llamas, MD Pulmonary and [place]  CC: [name], MD

## 2021-03-06 NOTE — Patient Instructions (Addendum)
The patient should have follow up scheduled with myself in 3 months.   Prior to next visit patient should have:  Sleep study - in lab  Start taking cetirizine, zyrtec for allergies. Keep taking singulair.

## 2021-03-07 ENCOUNTER — Telehealth: Payer: Self-pay | Admitting: *Deleted

## 2021-03-07 NOTE — Addendum Note (Signed)
Addended by: Vanessa Barbara on: 03/07/2021 10:32 AM   Modules accepted: Orders

## 2021-03-07 NOTE — Telephone Encounter (Addendum)
Spoke with Dr. Shearon Stalls, she wanted to cancel the split night study and order an ONO on RA instead.  Notified the PCCs as Epic would not allow me to cancel the split night study as it had already been scheduled.  Order placed for ONO on RA.

## 2021-03-09 IMAGING — CT CT ABD-PELV W/ CM
2 of 5 series · 16 of 46 positions shown, 18 images · IV contrast (APPLIED)
Comparison: 10/09/2019

CLINICAL DATA: Acute, nonlocalized abdominal pain

EXAM:
CT ABDOMEN AND PELVIS WITH CONTRAST
TECHNIQUE: Multidetector CT imaging of the abdomen and pelvis was performed
using the standard protocol following bolus administration of
intravenous contrast.
CONTRAST:  100mL OMNIPAQUE IOHEXOL 300 MG/ML  SOLN

[Series 3: abdomen 5.0 · axial · 0.89mm/px · z∈[+782,+1172]mm · 13 of 92 slices shown, 15 images]
[im 7/92  soft-tissue]
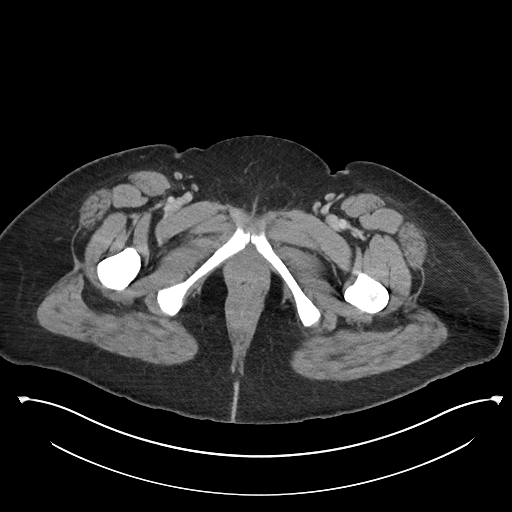
[im 7/92  bone]
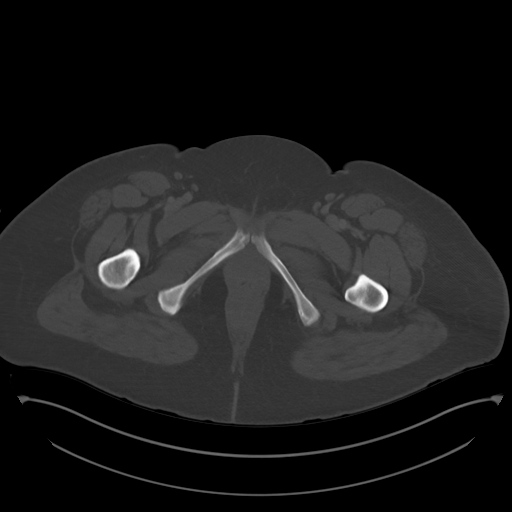
[im 14/92  soft-tissue]
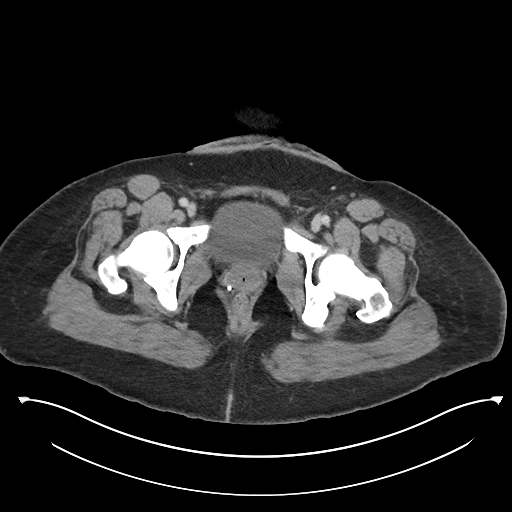
[im 20/92  soft-tissue]
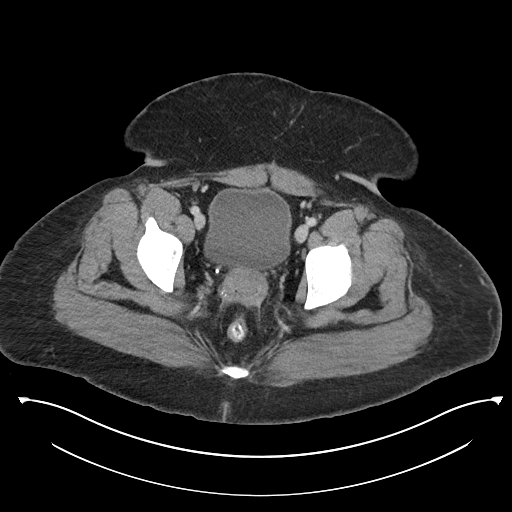
[im 27/92  soft-tissue]
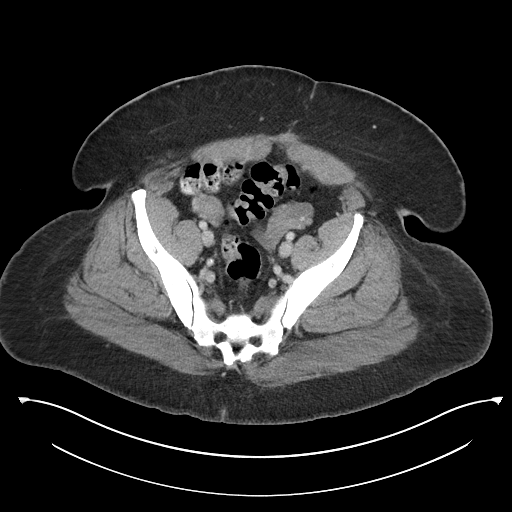
[im 33/92  soft-tissue]
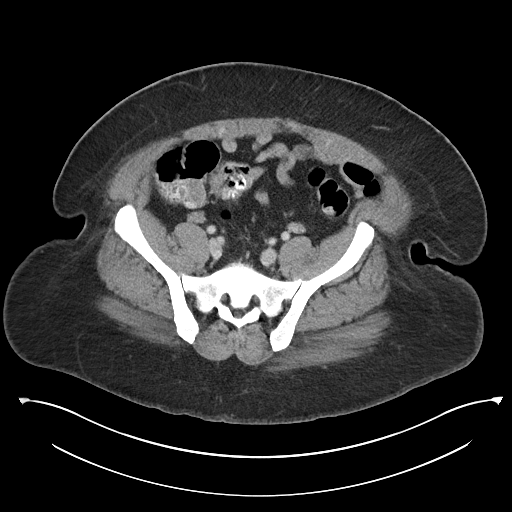
[im 40/92  soft-tissue]
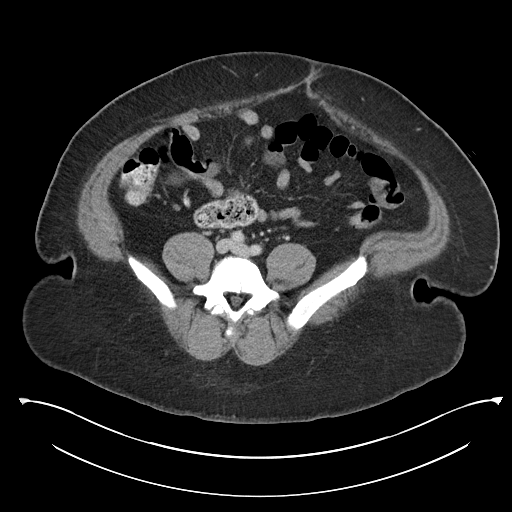
[im 46/92  soft-tissue]
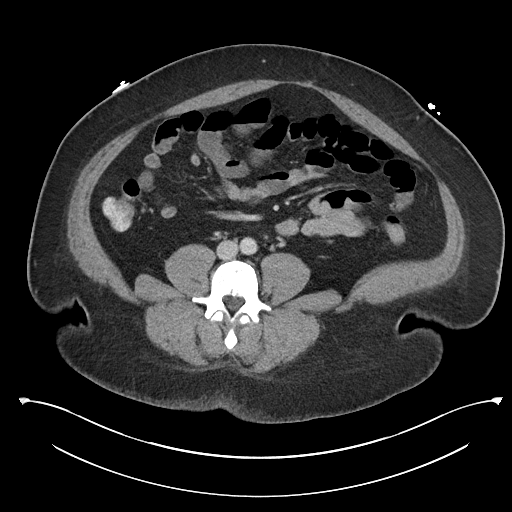
[im 53/92  soft-tissue]
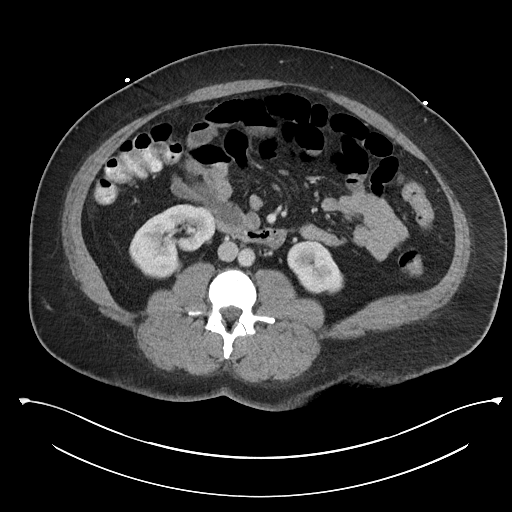
[im 59/92  soft-tissue]
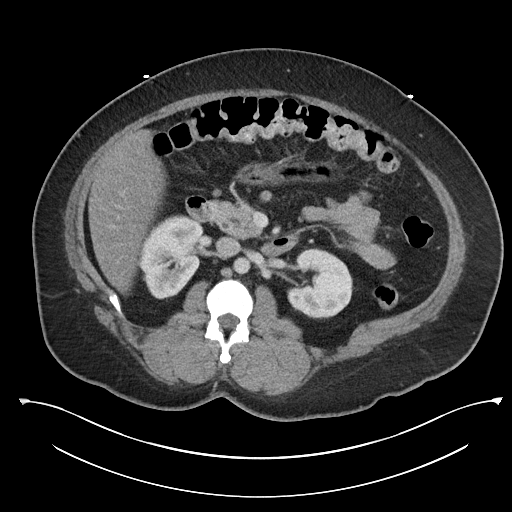
[im 59/92  bone]
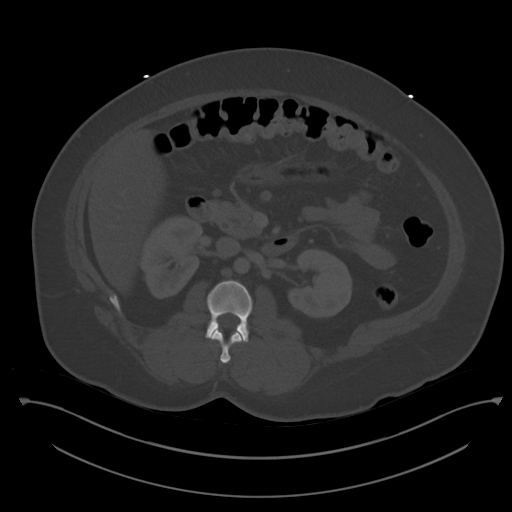
[im 66/92  soft-tissue]
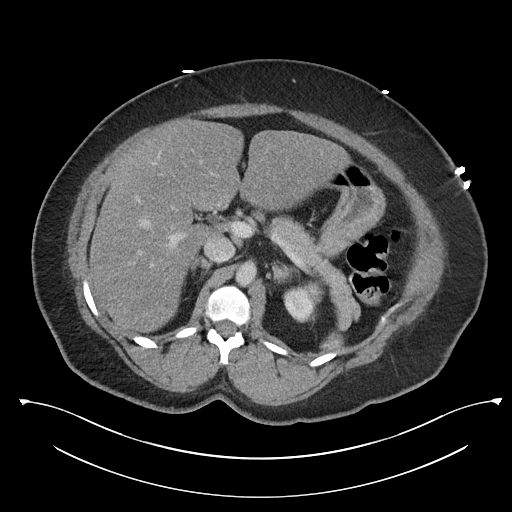
[im 72/92  soft-tissue]
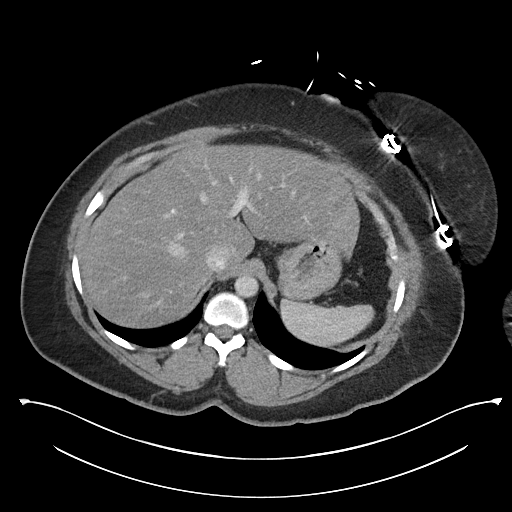
[im 79/92  soft-tissue]
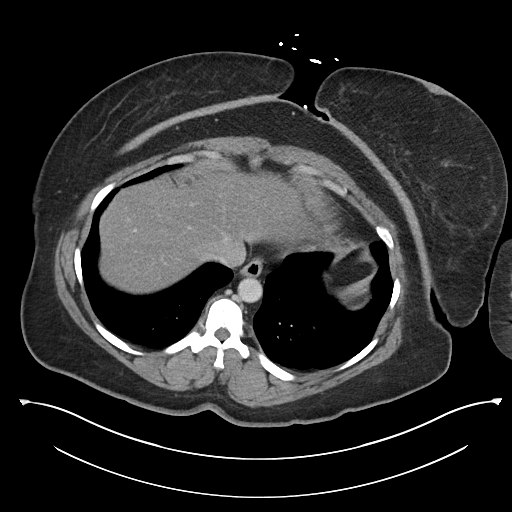
[im 85/92  soft-tissue]
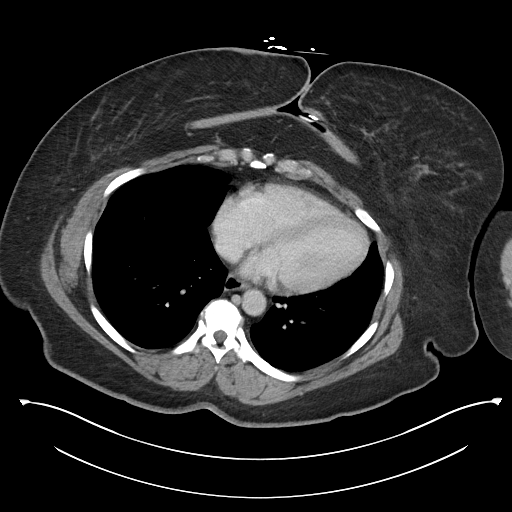

[Series 6: abdomen 3.0 mpr cor · coronal · 0.92mm/px · 3 of 124 slices shown]
[im 42/124  soft-tissue]
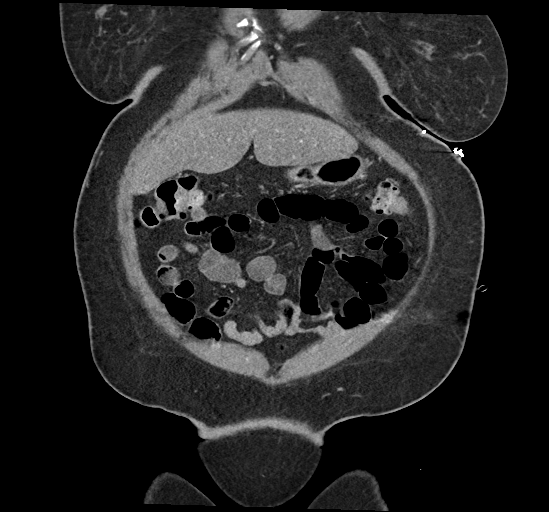
[im 55/124  soft-tissue]
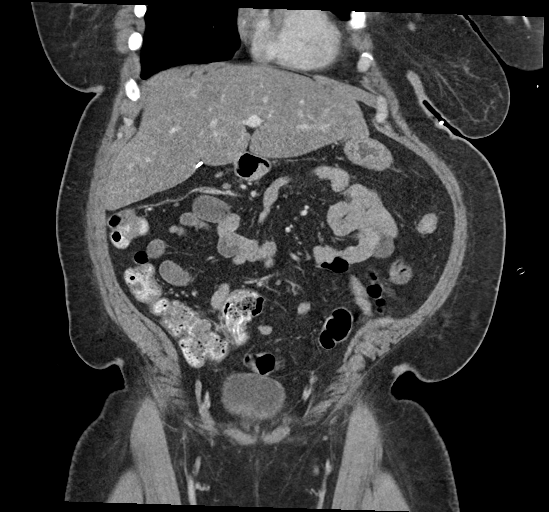
[im 69/124  soft-tissue]
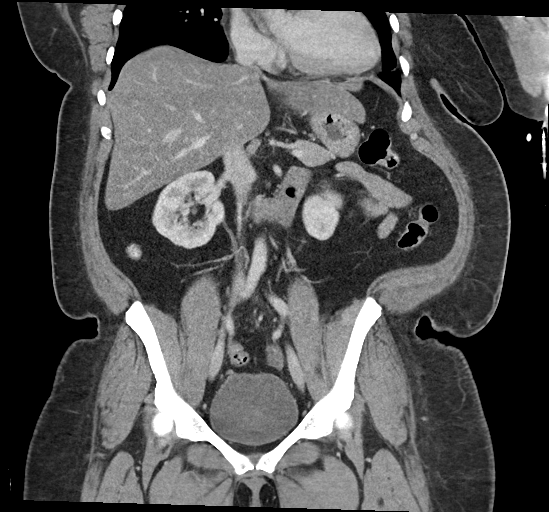

[16 of 46 positions shown; findings below may reference images not displayed]

FINDINGS: Lower chest: The visualized lung bases are clear bilaterally. The
visualized heart and pericardium are unremarkable.

Hepatobiliary: Moderate hepatic steatosis. Cholecystectomy has been
performed. No intra or extrahepatic biliary ductal dilation.

Pancreas: Unremarkable

Spleen: Unremarkable

Adrenals/Urinary Tract: Adrenal glands are unremarkable. Kidneys are
normal, without renal calculi, focal lesion, or hydronephrosis.
Bladder is unremarkable.

Stomach/Bowel: Stomach, small bowel, and large bowel are
unremarkable. Appendix absent. No free intraperitoneal gas or fluid.

Vascular/Lymphatic: No significant vascular findings are present. No
enlarged abdominal or pelvic lymph nodes.

Reproductive: Left adnexal multi-cystic lesion has decreased in size
since prior examination, measuring 2.9 x 6.0 x 3.4 cm in greatest
dimension (previously measuring 3.8 x 6.4 x 4.8 cm in greatest
dimension). Its interval decrease in size favors a benign etiology
though this is not definitively characterized on this examination.
The pelvic organs are otherwise unremarkable.

Other: The rectum is unremarkable

Musculoskeletal: Osseous structures are age-appropriate.
IMPRESSION: No acute intra-abdominal abnormality. No definite radiographic
explanation for the patient's reported symptoms.

Moderate hepatic steatosis, stable since prior examination.

Interval decrease in size of complex multi cystic lesion within the
left ovary. While this favors a benign etiology, this is not
optimally characterized on this examination. This could be more
completely evaluated with dedicated pelvic sonography or contrast
enhanced MRI examination if indicated.

## 2021-03-14 ENCOUNTER — Telehealth: Payer: Self-pay | Admitting: Family Medicine

## 2021-03-14 NOTE — Telephone Encounter (Signed)
Copied from [place] [phone]. Topic: General - Other >> Mar 13, 2021 12:44 PM Pawlus, Brayton Layman A wrote: Reason for CRM: [employer] called to follow up on a fax they sent over regarding medical necessity for the pt, please advise if you have received anything. >> Mar 14, 2021  9:12 AM Lennox Solders wrote: Thayer Headings with [facility] [employer] will refax the medical necessity paperwork and please fax back to [phone] not the fax number on the forms  Per Doroteo Bradford this was placed in Dr. Margarita Rana box.

## 2021-03-14 NOTE — Telephone Encounter (Signed)
Fax has been received and will be faxed once complete.

## 2021-03-22 ENCOUNTER — Ambulatory Visit: Payer: 59 | Admitting: Endocrinology

## 2021-03-26 ENCOUNTER — Telehealth: Payer: Self-pay | Admitting: Internal Medicine

## 2021-03-26 IMAGING — DX DG CHEST 1V PORT
1 series · 1 of 1 positions shown · non-contrast
Comparison: March 25, 2019

CLINICAL DATA: Chest pain and shortness of breath. Bloody sputum
coming from tracheostomy.

EXAM:
PORTABLE CHEST 1 VIEW

[chest]
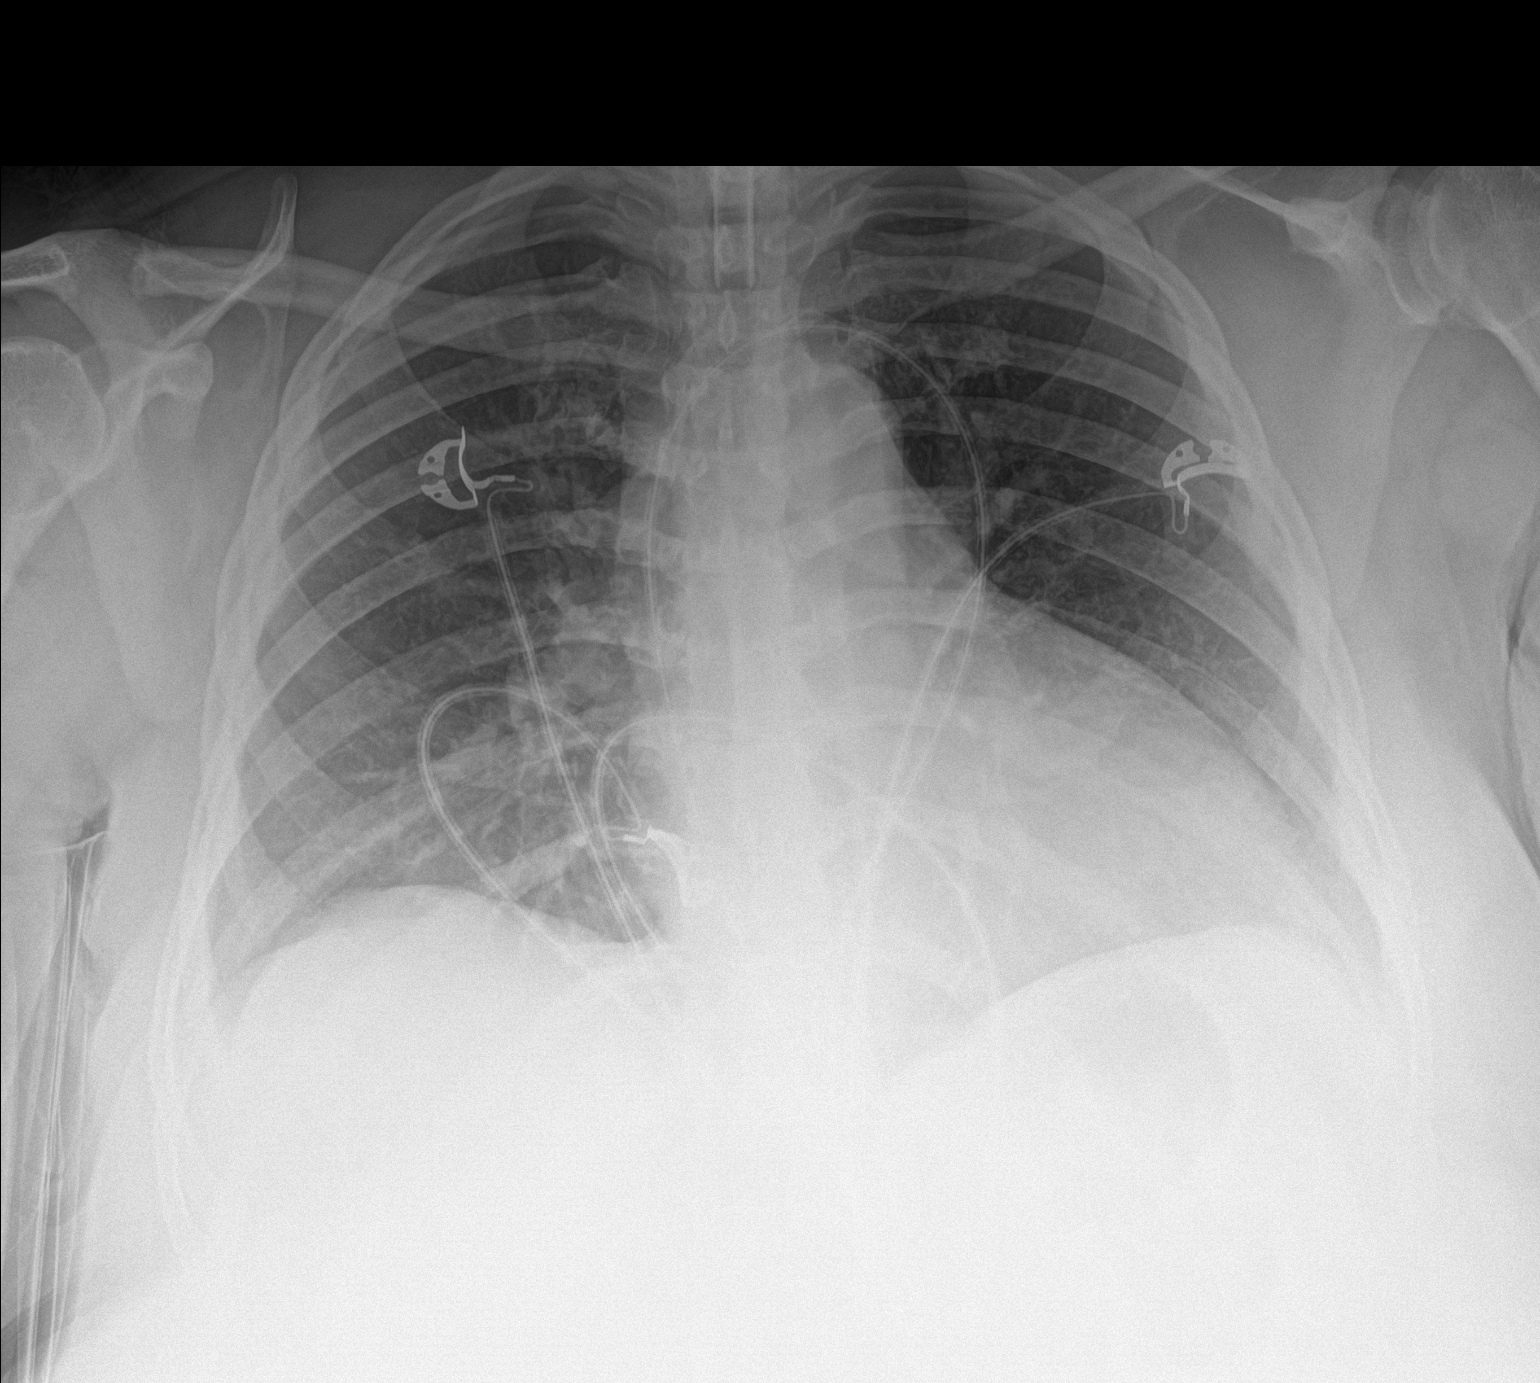

[1 of 1 positions shown; findings below may reference images not displayed]

FINDINGS: The tracheostomy tube is stable. The cardiomediastinal silhouette is
unchanged. No pneumothorax. No nodules or masses. No focal
infiltrates.
IMPRESSION: No active disease.

## 2021-03-26 IMAGING — CT CT ANGIO CHEST
2 of 6 series · 19 of 36 positions shown · IV contrast (omnipaque)
Comparison: 03/22/2019.  Current chest radiograph.

CLINICAL DATA: Short of breath. Blood streaked sputum. Centralized
chest pain for 1 week.

EXAM:
CT ANGIOGRAPHY CHEST WITH CONTRAST
TECHNIQUE: Multidetector CT imaging of the chest was performed using the
standard protocol during bolus administration of intravenous
contrast. Multiplanar CT image reconstructions and MIPs were
obtained to evaluate the vascular anatomy.
CONTRAST:  75mL OMNIPAQUE IOHEXOL 350 MG/ML SOLN

[Series 7: pe thins · axial · 0.89mm/px · z∈[+1168,+1388]mm · 18 of 350 slices shown]
[im 18/350  lung]
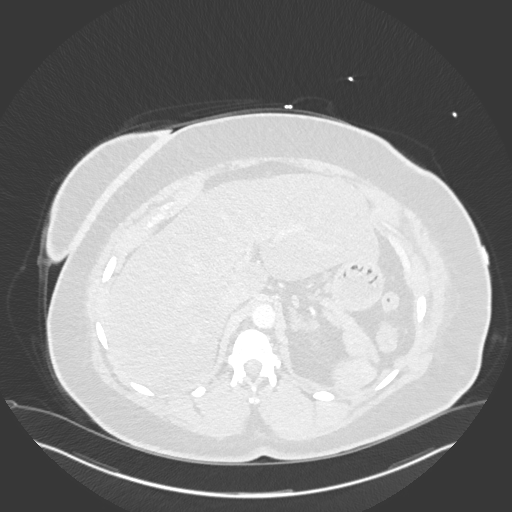
[im 35/350  mediastinal]
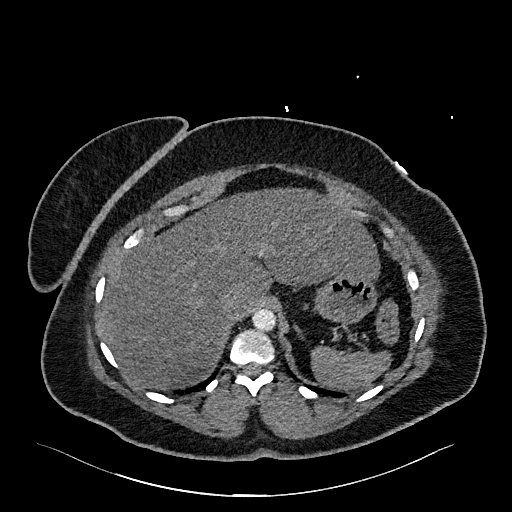
[im 53/350  lung]
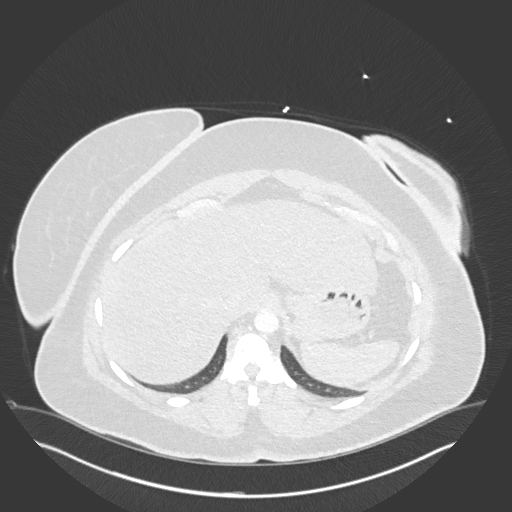
[im 70/350  mediastinal]
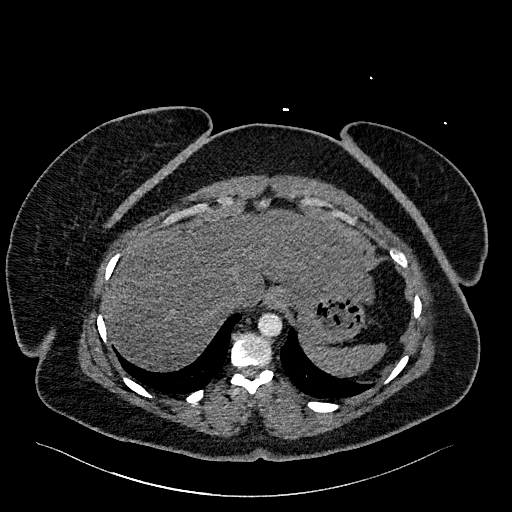
[im 88/350  lung]
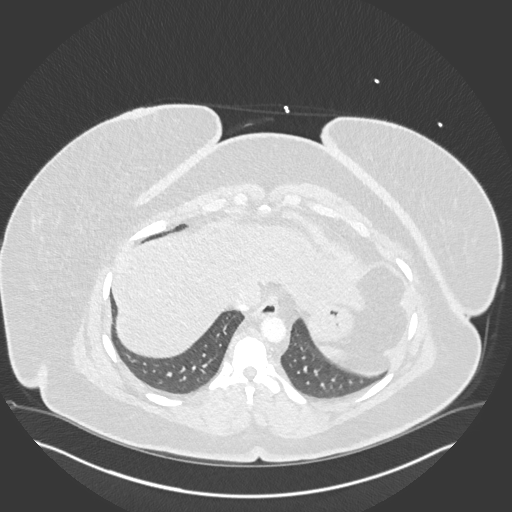
[im 105/350  mediastinal]
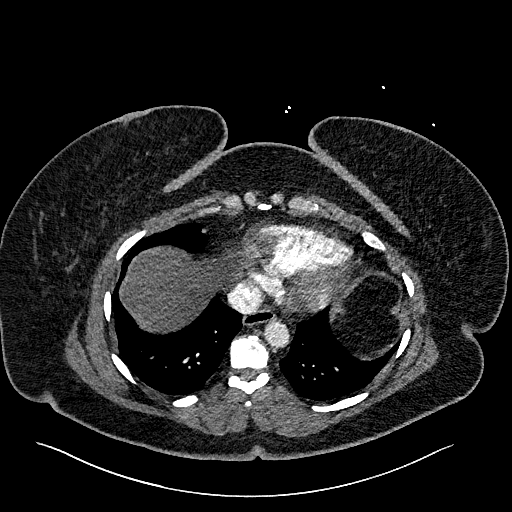
[im 123/350  lung]
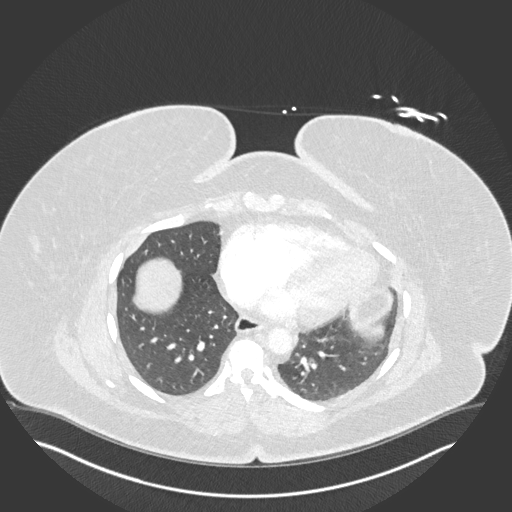
[im 140/350  mediastinal]
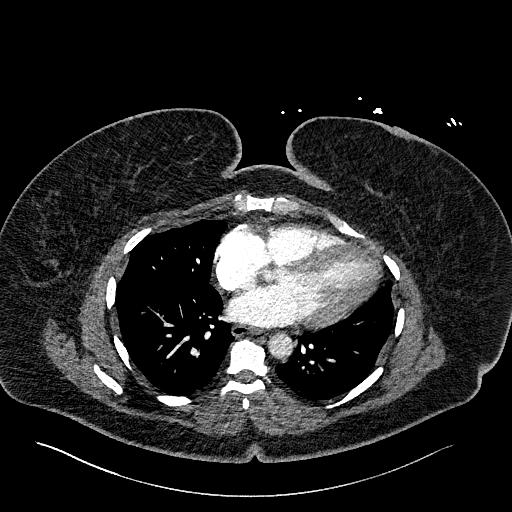
[im 158/350  lung]
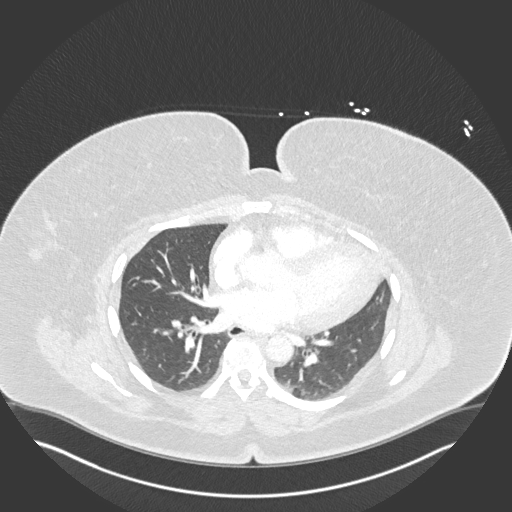
[im 192/350  mediastinal]
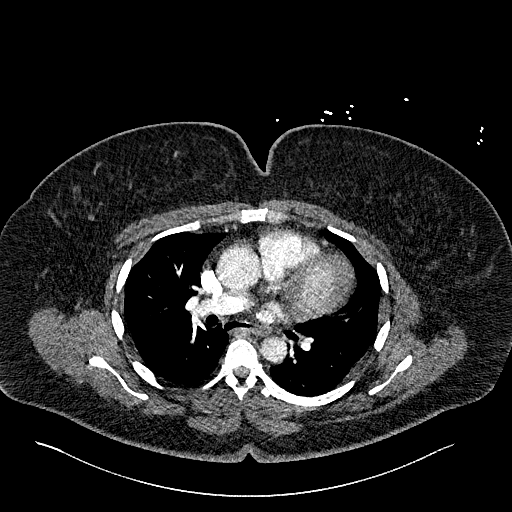
[im 210/350  lung]
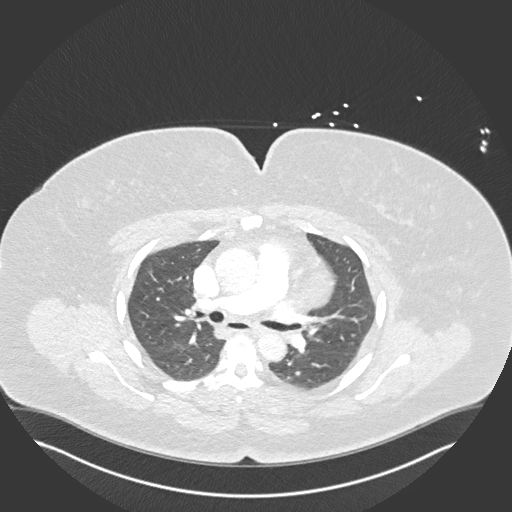
[im 227/350  mediastinal]
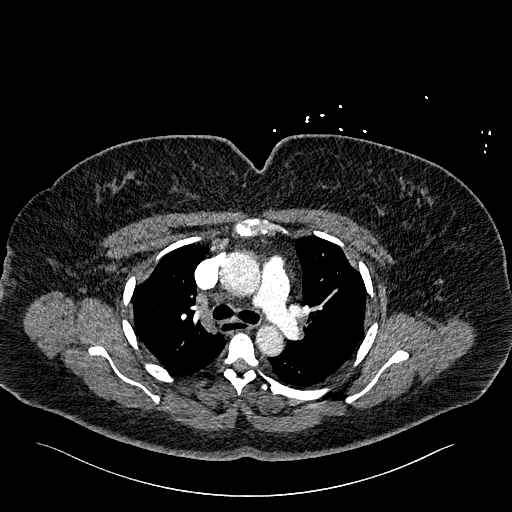
[im 245/350  lung]
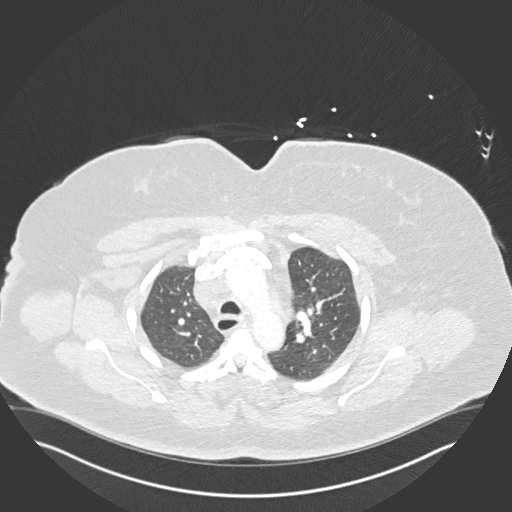
[im 262/350  mediastinal]
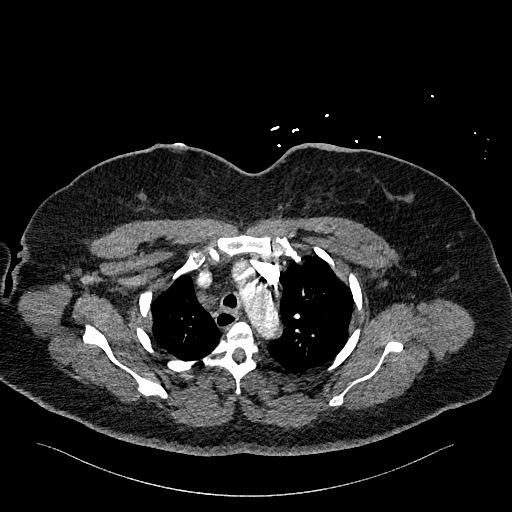
[im 280/350  lung]
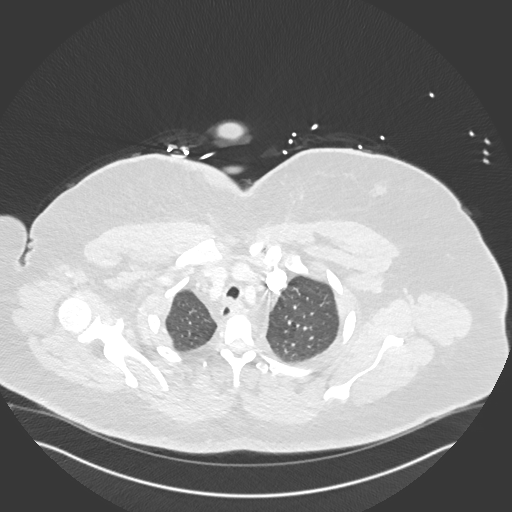
[im 297/350  mediastinal]
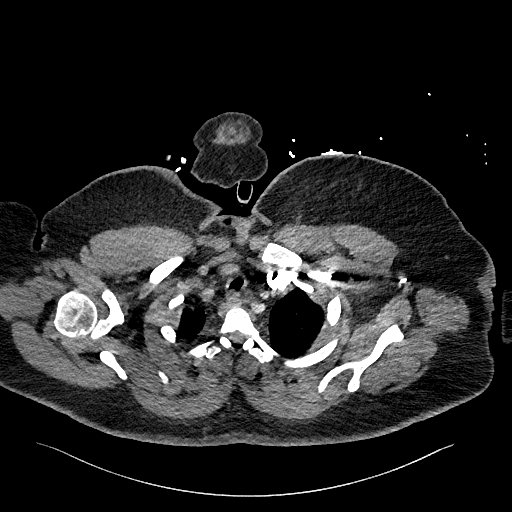
[im 315/350  lung]
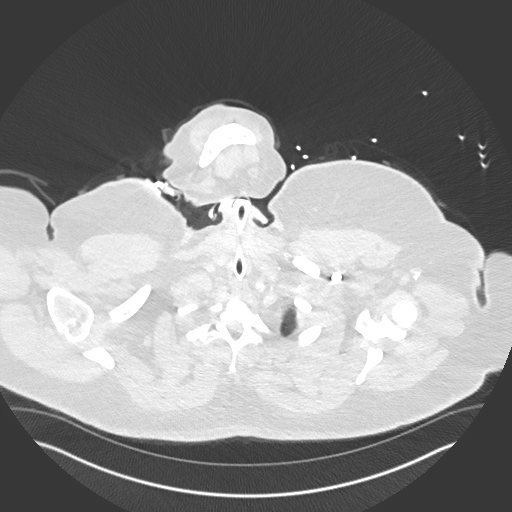
[im 332/350  mediastinal]
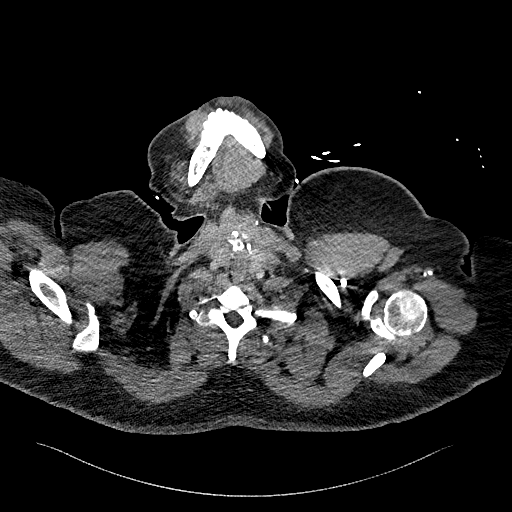

[Series 8: pe 2mm cor · coronal · 0.49mm/px · 1 of 122 slices shown]
[im 61/122  mediastinal]
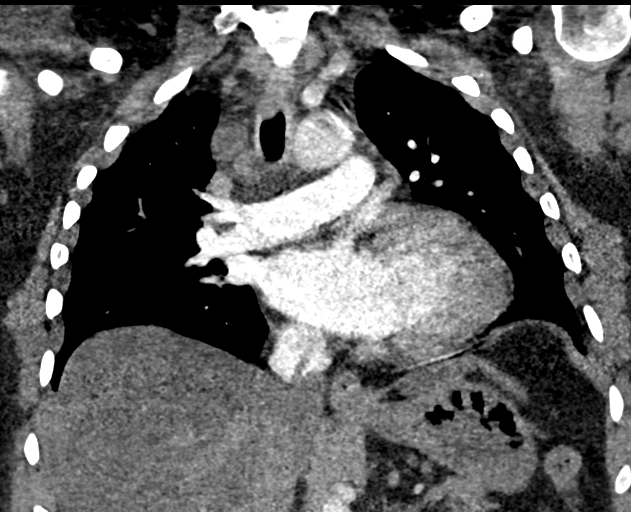

[19 of 36 positions shown; findings below may reference images not displayed]

FINDINGS: Cardiovascular: Pulmonary arteries are well opacified. There is no
evidence of a pulmonary embolism.

Heart is normal in size and configuration. No pericardial effusion.
No coronary artery calcifications. Normal great vessels.

Mediastinum/Nodes: Well-positioned tracheostomy tube stable from the
prior exam. No neck base, axillary, mediastinal or hilar masses or
enlarged lymph nodes. Trachea and esophagus are unremarkable.

Lungs/Pleura: Lungs are clear. No pleural effusion or pneumothorax.

Upper Abdomen: Fatty infiltration of the liver.  No acute findings.

Musculoskeletal: No fracture or acute finding. No bone lesion. No
chest wall masses.

Review of the MIP images confirms the above findings.
IMPRESSION: 1. No evidence of a pulmonary embolism.
2. No acute findings.  Clear lungs.

## 2021-03-26 NOTE — Telephone Encounter (Signed)
[facility] phone line rang then switched over to busy signal. Pended O2 order as decitated by Dr. Shearon Stalls. Will attempt to reach at later time.

## 2021-03-26 NOTE — Telephone Encounter (Signed)
Received ONO results - patient qualifies for home oxygen with sleep. Prescribe 2LPM (or 40% FIO2) over Trach collar at night. DME - [facility]

## 2021-03-27 NOTE — Telephone Encounter (Signed)
[facility] phone line rang x 3 then went over to busy signal. Will attempt to reach pt at later time.

## 2021-04-01 ENCOUNTER — Encounter (HOSPITAL_COMMUNITY): Payer: Self-pay | Admitting: *Deleted

## 2021-04-01 ENCOUNTER — Emergency Department (HOSPITAL_COMMUNITY): Payer: 59

## 2021-04-01 ENCOUNTER — Emergency Department (HOSPITAL_COMMUNITY)
Admission: EM | Admit: 2021-04-01 | Discharge: 2021-04-01 | Disposition: A | Discharge status: Home / Self Care | Payer: 59 | Attending: Emergency Medicine | Admitting: Emergency Medicine

## 2021-04-01 DIAGNOSIS — Y9289 Other specified places as the place of occurrence of the external cause: Secondary | ICD-10-CM | POA: Diagnosis not present

## 2021-04-01 DIAGNOSIS — I10 Essential (primary) hypertension: Secondary | ICD-10-CM | POA: Diagnosis not present

## 2021-04-01 DIAGNOSIS — J449 Chronic obstructive pulmonary disease, unspecified: Secondary | ICD-10-CM | POA: Insufficient documentation

## 2021-04-01 DIAGNOSIS — S93401A Sprain of unspecified ligament of right ankle, initial encounter: Secondary | ICD-10-CM

## 2021-04-01 DIAGNOSIS — M25561 Pain in right knee: Secondary | ICD-10-CM | POA: Insufficient documentation

## 2021-04-01 DIAGNOSIS — Z79899 Other long term (current) drug therapy: Secondary | ICD-10-CM | POA: Insufficient documentation

## 2021-04-01 DIAGNOSIS — S0990XA Unspecified injury of head, initial encounter: Secondary | ICD-10-CM | POA: Diagnosis not present

## 2021-04-01 DIAGNOSIS — M25511 Pain in right shoulder: Secondary | ICD-10-CM | POA: Diagnosis not present

## 2021-04-01 DIAGNOSIS — S99911A Unspecified injury of right ankle, initial encounter: Secondary | ICD-10-CM | POA: Diagnosis present

## 2021-04-01 DIAGNOSIS — T1490XA Injury, unspecified, initial encounter: Secondary | ICD-10-CM

## 2021-04-01 DIAGNOSIS — W109XXA Fall (on) (from) unspecified stairs and steps, initial encounter: Secondary | ICD-10-CM | POA: Insufficient documentation

## 2021-04-01 DIAGNOSIS — M25551 Pain in right hip: Secondary | ICD-10-CM | POA: Diagnosis not present

## 2021-04-01 DIAGNOSIS — Z87891 Personal history of nicotine dependence: Secondary | ICD-10-CM | POA: Diagnosis not present

## 2021-04-01 DIAGNOSIS — I712 Thoracic aortic aneurysm, without rupture, unspecified: Secondary | ICD-10-CM

## 2021-04-01 DIAGNOSIS — W19XXXA Unspecified fall, initial encounter: Secondary | ICD-10-CM

## 2021-04-01 DIAGNOSIS — Y9301 Activity, walking, marching and hiking: Secondary | ICD-10-CM | POA: Insufficient documentation

## 2021-04-01 LAB — CBC WITH DIFFERENTIAL/PLATELET
Abs Immature Granulocytes: 0.01 10*3/uL (ref 0.00–0.07)
Basophils Absolute: 0 10*3/uL (ref 0.0–0.1)
Basophils Relative: 0 %
Eosinophils Absolute: 0 10*3/uL (ref 0.0–0.5)
Eosinophils Relative: 0 %
HCT: 33.2 % — ABNORMAL LOW (ref 36.0–46.0)
Hemoglobin: 10.7 g/dL — ABNORMAL LOW (ref 12.0–15.0)
Immature Granulocytes: 0 %
Lymphocytes Relative: 42 %
Lymphs Abs: 3 10*3/uL (ref 0.7–4.0)
MCH: 28.1 pg (ref 26.0–34.0)
MCHC: 32.2 g/dL (ref 30.0–36.0)
MCV: 87.1 fL (ref 80.0–100.0)
Monocytes Absolute: 0.5 10*3/uL (ref 0.1–1.0)
Monocytes Relative: 7 %
Neutro Abs: 3.7 10*3/uL (ref 1.7–7.7)
Neutrophils Relative %: 51 %
Platelets: 342 10*3/uL (ref 150–400)
RBC: 3.81 MIL/uL — ABNORMAL LOW (ref 3.87–5.11)
RDW: 14 % (ref 11.5–15.5)
WBC: 7.3 10*3/uL (ref 4.0–10.5)
nRBC: 0 % (ref 0.0–0.2)

## 2021-04-01 LAB — I-STAT CHEM 8, ED
BUN: 15 mg/dL (ref 6–20)
Calcium, Ion: 1.18 mmol/L (ref 1.15–1.40)
Chloride: 108 mmol/L (ref 98–111)
Creatinine, Ser: 1.3 mg/dL — ABNORMAL HIGH (ref 0.44–1.00)
Glucose, Bld: 216 mg/dL — ABNORMAL HIGH (ref 70–99)
HCT: 31 % — ABNORMAL LOW (ref 36.0–46.0)
Hemoglobin: 10.5 g/dL — ABNORMAL LOW (ref 12.0–15.0)
Potassium: 4 mmol/L (ref 3.5–5.1)
Sodium: 140 mmol/L (ref 135–145)
TCO2: 23 mmol/L (ref 22–32)

## 2021-04-01 LAB — BASIC METABOLIC PANEL
Anion gap: 7 (ref 5–15)
BUN: 12 mg/dL (ref 6–20)
CO2: 23 mmol/L (ref 22–32)
Calcium: 8.7 mg/dL — ABNORMAL LOW (ref 8.9–10.3)
Chloride: 107 mmol/L (ref 98–111)
Creatinine, Ser: 1.26 mg/dL — ABNORMAL HIGH (ref 0.44–1.00)
GFR, Estimated: 52 mL/min — ABNORMAL LOW (ref >60)
Glucose, Bld: 219 mg/dL — ABNORMAL HIGH (ref 70–99)
Potassium: 4 mmol/L (ref 3.5–5.1)
Sodium: 137 mmol/L (ref 135–145)

## 2021-04-01 MED ORDER — NAPROXEN 375 MG PO TABS: 375.0000 mg | ORAL_TABLET | Freq: Two times a day (BID) | ORAL | 0 refills | Status: DC

## 2021-04-01 MED ORDER — SODIUM CHLORIDE 0.9 % IV SOLN: 1000.0000 mL | INTRAVENOUS | Status: DC

## 2021-04-01 MED ORDER — MORPHINE SULFATE (PF) 4 MG/ML IV SOLN
4.0000 mg | Freq: Once | INTRAVENOUS | Status: AC
  Administered 2021-04-01: 4 mg via INTRAVENOUS
  Filled 2021-04-01: qty 1

## 2021-04-01 MED ORDER — SODIUM CHLORIDE 0.9 % IV BOLUS
1000.0000 mL | Freq: Once | INTRAVENOUS | Status: AC
  Administered 2021-04-01: 1000 mL via INTRAVENOUS

## 2021-04-01 MED ORDER — IOHEXOL 350 MG/ML SOLN
100.0000 mL | Freq: Once | INTRAVENOUS | Status: AC | PRN
  Administered 2021-04-01: 100 mL via INTRAVENOUS

## 2021-04-01 MED ORDER — HYDROMORPHONE HCL 1 MG/ML IJ SOLN
1.0000 mg | Freq: Once | INTRAMUSCULAR | Status: AC
  Administered 2021-04-01: 1 mg via INTRAVENOUS
  Filled 2021-04-01: qty 1

## 2021-04-01 MED ORDER — SODIUM CHLORIDE 0.9 % IV BOLUS (SEPSIS): 1000.0000 mL | Freq: Once | INTRAVENOUS | Status: DC

## 2021-04-01 NOTE — Progress Notes (Signed)
Orthopedic Tech Progress Note Patient Details:  [name] 03/05/1971 [id number] Applied ASO to RLE and provided crutches. Ortho Devices Type of Ortho Device: ASO, Crutches Ortho Device/Splint Location: RLE Ortho Device/Splint Interventions: Ordered, Application   Post Interventions Patient Tolerated: Well Instructions Provided: Adjustment of device  Petra Kuba 04/01/2021, 9:17 PM

## 2021-04-01 NOTE — Discharge Instructions (Addendum)
Incidental aortic aneurysm was noted on your CT scan.  No treatment is needed but it is recommended to monitor these with follow up yearly CT or MRI scans.  These can be arranged through your primary care doctor.  Continue your oxycodone.  Take the medications as needed for pain.  Follow-up with your orthopedic doctor to be rechecked

## 2021-04-01 NOTE — ED Provider Notes (Signed)
[facility] EMERGENCY DEPARTMENT Provider Note   CSN: [id number] Arrival date & time: 04/01/21  1457     History Chief Complaint  Patient presents with   [name] is a 50 y.o. female.   Fall   Patient presents to the ED for evaluation after a fall.  Patient was walking down her steps this afternoon when she thinks she may have slipped and ended up falling down 4 steps.  Patient states her right leg bent backwards.  She did not lose consciousness but she did hit her head.  He is having pain now on the entire right side of her body.  Patient initially was able to get up but is having significant pain and called EMS for help.  Patient states her hip knee and ankle is hurting.  She is also having pain in her right shoulder as well as her neck and back.  Pain is severe.  She denies any difficulty breathing.  No abdominal pain.  No numbness.  Past Medical History:  Diagnosis Date   Allergy    Anxiety    Arthritis    Asthma    Chronic back pain    Chronic chest pain    resolved, no problems since 2019 per patient 07/27/19   COPD (chronic obstructive pulmonary disease) ([place])    Depression    DM (diabetes mellitus) ([place])    INSULIN DEPENDENT - TYPE 1   GERD (gastroesophageal reflux disease)    Headache(784.0)    Hyperlipidemia    no med, diet controlled   Hypertension    Hypokalemia    Respiratory disease 05/2017   uses inhalers, neb tx prn, no oxygen   Sleep apnea    does not use CPAP due to trach   Tracheostomy in place [facility]) 02/2017    Patient Active Problem List   Diagnosis Date Noted   Abdominal pain, epigastric 11/28/2020   Nausea and vomiting 11/28/2020   COVID-19 09/16/2020   Dyspnea 08/26/2020   Rotator cuff tear 07/28/2019   Insulin dependent type 2 diabetes mellitus ([place]) 04/12/2019   Chest pain of uncertain etiology 78/29/5621   Lactic acidosis 03/22/2019   Chronic right shoulder pain    Community acquired  pneumonia 10/25/2018   Asthma, chronic, unspecified asthma severity, with acute exacerbation 10/25/2018   Acute respiratory failure with hypoxia and hypercapnia ([place]) 10/23/2018   Acute pain of right shoulder due to trauma 10/05/2018   Chronic bilateral low back pain without sciatica 10/05/2018   Dysphonia 10/05/2018   Severe asthma with exacerbation 10/05/2018   Rotator cuff arthropathy, right 06/15/2018   AKI (acute kidney injury) ([place]) 03/06/2018   Carpal tunnel syndrome 01/18/2018   Heart failure ([place]) 05/20/2017   Normocytic anemia 05/06/2017   Tracheostomy dependent ([place]) 03/26/2017   Dysphagia 03/26/2017   Status post tracheostomy ([place]) 03/14/2017   Klebsiella cystitis 03/14/2017   Morbid obesity ([place]) 03/13/2017   Subglottic stenosis 03/06/2017   Asthma 03/06/2017   Chronic diastolic heart failure ([place]) 03/06/2017   Tracheal stenosis 03/04/2017   Acute blood loss anemia    Generalized anxiety disorder    Chronic pain syndrome    GERD (gastroesophageal reflux disease) 02/22/2016   Depression 02/22/2016   Migraine 01/30/2015   Bulging lumbar disc 09/05/2013   Essential hypertension 09/05/2013   Allergic rhinitis, seasonal 08/11/2012   Chronic cough 08/11/2012   OSA (obstructive sleep apnea) 12/04/2011    Past Surgical History:  Procedure Laterality Date  ABDOMINAL HYSTERECTOMY  2009   APPENDECTOMY     CESAREAN SECTION     x 3   CHOLECYSTECTOMY N/A 03/02/2014   Procedure: LAPAROSCOPIC CHOLECYSTECTOMY;  Surgeon: Joyice Faster. Cornett, MD;  Location: [place];  Service: General;  Laterality: N/A;   HERNIA REPAIR     PANENDOSCOPY N/A 03/04/2017   Procedure: PANENDOSCOPY WITH POSSIBLE FOREIGN BODY REMOVAL;  Surgeon: Jodi Marble, MD;  Location: [place];  Service: ENT;  Laterality: N/A;   ROTATOR CUFF REPAIR Left    ROTATOR CUFF REPAIR Right 07/27/2019   SHOULDER ARTHROSCOPY WITH SUBACROMIAL DECOMPRESSION, ROTATOR CUFF REPAIR AND BICEP TENDON REPAIR Right 07/28/2019    Procedure: RIGHT SHOULDER ARTHROSCOP, MINI OPEN ROTATOR CUFF TEAR REPAIR,  BICEPS TENODESIS, DISTAL CLAVICLE EXCISION;  Surgeon: Meredith Pel, MD;  Location: [place];  Service: Orthopedics;  Laterality: Right;   TOOTH EXTRACTION N/A 02/22/2021   Procedure: DENTAL RESTORATION/EXTRACTIONS;  Surgeon: Diona Browner, DMD;  Location: [facility];  Service: Oral Surgery;  Laterality: N/A;   TRACHEOSTOMY  02/2017   TUBAL LIGATION     VESICOVAGINAL FISTULA CLOSURE W/ TAH  2009     OB History     Gravida  4   Para  3   Term  3   Preterm      AB  1   Living  3      SAB      IAB      Ectopic  1   Multiple      Live Births  3           Family History  Problem Relation Age of Onset   Heart attack Mother    Stroke Mother    Diabetes Mother    Hypertension Mother    Arthritis Mother    Stroke Father    Asthma Sister    Hypertension Sister    Asthma Sister    Diabetes Sister    Asthma Brother    Seizures Brother    Asthma Brother    Diabetes Brother    Asthma Daughter    Asthma Son    Asthma Grandson    Colon cancer Neg Hx    Rectal cancer Neg Hx    Stomach cancer Neg Hx     Social History   Tobacco Use   Smoking status: Former    Packs/day: 0.50    Years: 25.00    Pack years: 12.50    Types: Cigarettes    Quit date: 01/21/2016    Years since quitting: 5.1   Smokeless tobacco: Never  Vaping Use   Vaping Use: Never used  Substance Use Topics   Alcohol use: Yes    Alcohol/week: 0.0 standard drinks    Comment: social wine   Drug use: Yes    Types: Marijuana    Comment: last used 1 weeks ago    Home Medications Prior to Admission medications   Medication Sig Start Date End Date Taking? Authorizing Provider  naproxen (NAPROSYN) 375 MG tablet Take 1 tablet (375 mg total) by mouth 2 (two) times daily. 04/01/21  Yes Dorie Rank, MD  albuterol [facility] HFA) [phone] Base) MCG/ACT inhaler Inhale 2 puffs into the lungs every 6 (six) hours as needed for wheezing or  shortness of breath. 11/27/20   [name], MD  albuterol (PROVENTIL) (2.5 MG/3ML) 0.083% nebulizer solution Take 3 mLs (2.5 mg total) by nebulization every 6 (six) hours as needed for wheezing or shortness of breath. 11/27/20  [name], MD  atorvastatin (LIPITOR) 20 MG tablet Take 1 tablet (20 mg total) by mouth daily. 11/27/20   [name], MD  Blood Glucose Monitoring Suppl (ONETOUCH VERIO) w/Device KIT 1 each by Does not apply route daily. Use to monitor glucose levels daily; E11.9 01/25/21   Renato Shin, MD  buPROPion [facility] SR) 150 MG 12 hr tablet Take 1 tablet (150 mg total) by mouth 2 (two) times daily. 11/27/20   [name], MD  cetirizine (ZYRTEC) 10 MG tablet Take 1 tablet (10 mg total) by mouth at bedtime. 03/06/21   Spero Geralds, MD  dapagliflozin propanediol (FARXIGA) 5 MG TABS tablet Take 1 tablet (5 mg total) by mouth daily before breakfast. 11/30/20   [name], MD  diclofenac Sodium (VOLTAREN) 1 % GEL Apply 4 g topically 4 (four) times daily. 11/27/20   [name], MD  furosemide (LASIX) 20 MG tablet Take 1 tablet (20 mg total) by mouth daily. 03/31/19   [name], MD  gabapentin (NEURONTIN) 300 MG capsule Take 2 capsules (600 mg total) by mouth 2 (two) times daily. 10/29/18   Domenic Polite, MD  glucose blood [facility] VERIO) test strip Use to monitor glucose levels daily; E11.9 10/04/20   Renato Shin, MD  hydrOXYzine (ATARAX/VISTARIL) 25 MG tablet Take 1 tablet (25 mg total) by mouth 3 (three) times daily as needed for anxiety. 11/27/20   [name], MD  insulin degludec (TRESIBA FLEXTOUCH) 200 UNIT/ML FlexTouch Pen Inject 150 Units into the skin daily. 11/16/20   Renato Shin, MD  Insulin Pen Needle 31G X 8 MM MISC Inject 1 Units as directed daily. 07/30/18   [provider]  Melatonin 3 MG TABS Take 3 mg by mouth at bedtime as needed (for sleep).  10/31/17   [provider]  methocarbamol (ROBAXIN) 750 MG tablet Take 750 mg by  mouth 3 (three) times daily. 02/08/21   [provider]  [place]. Devices MISC Nebulizer device. Diagnosis - Asthma 07/16/20   [name], MD  Misc. Devices [employer].  Diagnosis-low back pain 01/23/21   [name], MD  mometasone-formoterol (DULERA) 200-5 MCG/ACT AERO Inhale 2 puffs into the lungs in the morning and at bedtime. 07/11/20   [name], MD  ondansetron (ZOFRAN) 4 MG tablet Take 1 tablet (4 mg total) by mouth every 8 (eight) hours as needed for nausea or vomiting. 01/25/21 01/25/22  [name], MD  OneTouch Delica Lancets 72Z MISC 1 each by Does not apply route daily. Use to monitor glucose levels daily; E11.9 04/14/19   Renato Shin, MD  oxyCODONE-acetaminophen (PERCOCET) 10-325 MG tablet Take 1 tablet by mouth 4 (four) times daily as needed for pain.  05/02/20   [provider]  pantoprazole (PROTONIX) 40 MG tablet Take 1 tablet (40 mg total) by mouth 2 (two) times daily. 12/12/20   [name], MD  Semaglutide, 1 MG/DOSE, (OZEMPIC, 1 MG/DOSE,) 2 MG/1.5ML SOPN Inject 1 mg into the skin once a week. 11/16/20   Renato Shin, MD  sertraline (ZOLOFT) 100 MG tablet Take 1 tablet (100 mg total) by mouth at bedtime. 11/27/20   [name], MD  silver sulfADIAZINE (SILVADENE) 1 % cream Apply 1 application topically daily. 02/28/21   [name], MD  sucralfate (CARAFATE) 1 g tablet Take 1 tablet (1 g total) by mouth 4 (four) times daily -  with meals and at bedtime. 11/28/20   Zehr, Laban Emperor, PA-C  SUMAtriptan (IMITREX) 100 MG tablet At the onset of a  migraine; may repeat in 2 hours. Max daily dose 276m Patient taking differently: Take 100 mg by mouth every 2 (two) hours as needed for migraine. 11/27/20   NCharlott Rakes MD  tiZANidine (ZANAFLEX) 4 MG tablet Take 4 mg by mouth every 8 (eight) hours as needed for muscle spasms. 09/04/20   [provider]  topiramate (TOPAMAX) 100 MG tablet Take 1.5 tablets (150 mg total) by mouth 2 (two) times daily.  02/28/21   NCharlott Rakes MD  fluticasone-salmeterol (ADVAIR HFA) 230-21 MCG/ACT inhaler Inhale 2 puffs into the lungs 2 (two) times daily. 10/15/17 10/15/17  NCharlott Rakes MD    Allergies    Reglan [metoclopramide]  Review of Systems   Review of Systems  All other systems reviewed and are negative.  Physical Exam Updated Vital Signs BP 118/73   Pulse 85   Temp 98.3 F (36.8 C) (Oral)   Resp 16   LMP  (LMP Unknown)   SpO2 97%   Physical Exam Vitals and nursing note reviewed.  Constitutional:      General: She is not in acute distress.    Appearance: Normal appearance. She is well-developed. She is not diaphoretic.  HENT:     Head: Normocephalic and atraumatic. No raccoon eyes or Battle's sign.     Right Ear: External ear normal.     Left Ear: External ear normal.  Eyes:     General: Lids are normal.        Right eye: No discharge.     Conjunctiva/sclera:     Right eye: No hemorrhage.    Left eye: No hemorrhage. Neck:     Trachea: No tracheal deviation.  Cardiovascular:     Rate and Rhythm: Normal rate and regular rhythm.     Heart sounds: Normal heart sounds.  Pulmonary:     Effort: Pulmonary effort is normal. No respiratory distress.     Breath sounds: Normal breath sounds. No stridor.  Chest:     Chest wall: No tenderness.  Abdominal:     General: Bowel sounds are normal. There is no distension.     Palpations: Abdomen is soft. There is no mass.     Tenderness: There is no abdominal tenderness.     Comments:    Musculoskeletal:     Right shoulder: Tenderness present.     Right elbow: Normal.     Cervical back: Tenderness present. No swelling, edema or deformity. No spinous process tenderness.     Thoracic back: Tenderness present. No swelling or deformity.     Lumbar back: Tenderness present. No swelling.     Right hip: Tenderness present.     Right knee: Tenderness present.     Right ankle: No deformity. Tenderness present.     Right foot: Tenderness  present.     Comments: Pelvis stable, no ttp  Neurological:     Mental Status: She is alert.     GCS: GCS eye subscore is 4. GCS verbal subscore is 5. GCS motor subscore is 6.     Sensory: No sensory deficit.     Motor: No abnormal muscle tone.     Comments: Able to move all extremities, sensation intact throughout  Psychiatric:        Mood and Affect: Mood normal.        Speech: Speech normal.        Behavior: Behavior normal.    ED Results / Procedures / Treatments   Labs (all labs ordered are listed,  but only abnormal results are displayed) Labs Reviewed  BASIC METABOLIC PANEL - Abnormal; Notable for the following components:      Result Value   Glucose, Bld 219 (*)    Creatinine, Ser 1.26 (*)    Calcium 8.7 (*)    GFR, Estimated 52 (*)    All other components within normal limits  CBC WITH DIFFERENTIAL/PLATELET - Abnormal; Notable for the following components:   RBC 3.81 (*)    Hemoglobin 10.7 (*)    HCT 33.2 (*)    All other components within normal limits  I-STAT CHEM 8, ED - Abnormal; Notable for the following components:   Creatinine, Ser 1.30 (*)    Glucose, Bld 216 (*)    Hemoglobin 10.5 (*)    HCT 31.0 (*)    All other components within normal limits    EKG None  Radiology DG Ribs Unilateral W/Chest Right  Result Date: 04/01/2021 CLINICAL DATA:  Fall EXAM: RIGHT RIBS AND CHEST - 3+ VIEW COMPARISON:  09/16/2020 FINDINGS: Tracheostomy tube tip is at the level of the clavicular heads. Normal cardiomediastinal contours and pleural spaces. Lungs are clear. There is no right rib fracture. IMPRESSION: No right rib fracture. Electronically Signed   By: Ulyses Jarred M.D.   On: 04/01/2021 19:08   DG Thoracic Spine 2 View  Result Date: 04/01/2021 CLINICAL DATA:  Fall EXAM: THORACIC SPINE 2 VIEWS COMPARISON:  None. FINDINGS: There is no evidence of thoracic spine fracture. Alignment is normal. No other significant bone abnormalities are identified. IMPRESSION:  Negative. Electronically Signed   By: Ulyses Jarred M.D.   On: 04/01/2021 19:12   DG Lumbar Spine 2-3 Views  Result Date: 04/01/2021 CLINICAL DATA:  Fall EXAM: LUMBAR SPINE - 2-3 VIEW COMPARISON:  09/02/2018 FINDINGS: There is no evidence of lumbar spine fracture. Alignment is normal. Intervertebral disc spaces are maintained. IMPRESSION: Negative. Electronically Signed   By: Ulyses Jarred M.D.   On: 04/01/2021 19:11   DG Shoulder Right  Result Date: 04/01/2021 CLINICAL DATA:  Fall EXAM: RIGHT SHOULDER - 2+ VIEW COMPARISON:  None. FINDINGS: There is no evidence of fracture or dislocation. There is no evidence of arthropathy or other focal bone abnormality. Soft tissues are unremarkable. IMPRESSION: Negative. Electronically Signed   By: Ulyses Jarred M.D.   On: 04/01/2021 19:11   DG Knee 2 Views Right  Result Date: 04/01/2021 CLINICAL DATA:  Fall EXAM: RIGHT KNEE - 1-2 VIEW COMPARISON:  None. FINDINGS: No evidence of fracture, dislocation, or joint effusion. No evidence of arthropathy or other focal bone abnormality. Soft tissues are unremarkable. IMPRESSION: Negative. Electronically Signed   By: Ulyses Jarred M.D.   On: 04/01/2021 19:10   DG Ankle Complete Right  Result Date: 04/01/2021 CLINICAL DATA:  Fall EXAM: RIGHT ANKLE - COMPLETE 3+ VIEW COMPARISON:  None. FINDINGS: There is no evidence of fracture, dislocation, or joint effusion. There is no evidence of arthropathy or other focal bone abnormality. Soft tissues are unremarkable. IMPRESSION: Negative. Electronically Signed   By: Ulyses Jarred M.D.   On: 04/01/2021 19:08   CT Head Wo Contrast  Result Date: 04/01/2021 CLINICAL DATA:  Fall, head trauma EXAM: CT HEAD WITHOUT CONTRAST CT CERVICAL SPINE WITHOUT CONTRAST TECHNIQUE: Multidetector CT imaging of the head and cervical spine was performed following the standard protocol without intravenous contrast. Multiplanar CT image reconstructions of the cervical spine were also generated.  COMPARISON:  08/21/2016, 12/09/2020 FINDINGS: CT HEAD FINDINGS Brain: No evidence of acute infarction, hemorrhage, hydrocephalus,  extra-axial collection or mass lesion/mass effect. Vascular: No hyperdense vessel or unexpected calcification. Skull: Normal. Negative for fracture or focal lesion. Sinuses/Orbits: No acute finding. Other: None. CT CERVICAL SPINE FINDINGS Alignment: Facet joints are aligned without dislocation or traumatic listhesis. Dens and lateral masses are aligned. Skull base and vertebrae: No acute fracture. No primary bone lesion or focal pathologic process. Soft tissues and spinal canal: No prevertebral fluid or swelling. No visible canal hematoma. Disc levels: Minimal uncovertebral spurring within the thoracic spine. Disc heights are relatively preserved. No advanced facet arthropathy. Upper chest: Included lung apices are clear. Other: A tracheostomy tube is present. IMPRESSION: 1. No acute intracranial findings. 2. No acute fracture or traumatic listhesis of the cervical spine. Electronically Signed   By: Davina Poke D.O.   On: 04/01/2021 16:57   CT Cervical Spine Wo Contrast  Result Date: 04/01/2021 CLINICAL DATA:  Fall, head trauma EXAM: CT HEAD WITHOUT CONTRAST CT CERVICAL SPINE WITHOUT CONTRAST TECHNIQUE: Multidetector CT imaging of the head and cervical spine was performed following the standard protocol without intravenous contrast. Multiplanar CT image reconstructions of the cervical spine were also generated. COMPARISON:  08/21/2016, 12/09/2020 FINDINGS: CT HEAD FINDINGS Brain: No evidence of acute infarction, hemorrhage, hydrocephalus, extra-axial collection or mass lesion/mass effect. Vascular: No hyperdense vessel or unexpected calcification. Skull: Normal. Negative for fracture or focal lesion. Sinuses/Orbits: No acute finding. Other: None. CT CERVICAL SPINE FINDINGS Alignment: Facet joints are aligned without dislocation or traumatic listhesis. Dens and lateral masses are  aligned. Skull base and vertebrae: No acute fracture. No primary bone lesion or focal pathologic process. Soft tissues and spinal canal: No prevertebral fluid or swelling. No visible canal hematoma. Disc levels: Minimal uncovertebral spurring within the thoracic spine. Disc heights are relatively preserved. No advanced facet arthropathy. Upper chest: Included lung apices are clear. Other: A tracheostomy tube is present. IMPRESSION: 1. No acute intracranial findings. 2. No acute fracture or traumatic listhesis of the cervical spine. Electronically Signed   By: Davina Poke D.O.   On: 04/01/2021 16:57   CT CHEST ABDOMEN PELVIS W CONTRAST  Result Date: 04/01/2021 CLINICAL DATA:  Chest trauma EXAM: CT CHEST, ABDOMEN, AND PELVIS WITH CONTRAST TECHNIQUE: Multidetector CT imaging of the chest, abdomen and pelvis was performed following the standard protocol during bolus administration of intravenous contrast. Sagittal and coronal MPR images reconstructed from axial data set. CONTRAST:  19m OMNIPAQUE IOHEXOL 350 MG/ML SOLN IV. No oral contrast. COMPARISON:  CT angio chest 09/21/2020, CT abdomen and pelvis 08/10/2020 FINDINGS: CT CHEST FINDINGS Cardiovascular: Vascular structures patent. Aneurysmal dilatation ascending thoracic aorta 4.1 cm diameter image 23 previously 3.8 cm. Heart unremarkable. No pericardial effusion. Mediastinum/Nodes: Base of cervical region normal appearance. Tracheostomy tube present. Esophagus unremarkable. Fluid attenuation structure in the RIGHT superior mediastinum, RIGHT paratracheal, 3.5 x 1.8 cm image 15 consistent with a mediastinal cyst, stable size Lungs/Pleura: Minimal dependent atelectasis in the posterior lungs. Lungs otherwise clear. No pulmonary infiltrate, pleural effusion or pneumothorax. Musculoskeletal: No fractures. CT ABDOMEN PELVIS FINDINGS Hepatobiliary: Diffuse fatty infiltration of liver. No hepatic mass or acute injury. Gallbladder surgically absent. Pancreas:  Normal appearance Spleen: Normal appearance Adrenals/Urinary Tract: Adrenal glands, kidneys, ureters, and bladder normal appearance Stomach/Bowel: Appendix surgically absent by history. Stomach and bowel loops unremarkable Vascular/Lymphatic: Vascular structures patent. Aorta normal caliber. Scattered pelvic phleboliths. No adenopathy. Reproductive: Uterus surgically absent. 13 mm cyst RIGHT ovary, a simple appearance; no follow-up imaging recommended. LEFT ovary asymmetrically larger 4.5 x 3.0 cm, decreased in size since previous  exam. Other: No free air or free fluid. No hernia or inflammatory process. Musculoskeletal: No fractures. IMPRESSION: No acute intrathoracic, intra-abdominal or intrapelvic abnormalities. 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 Fatty infiltration of liver. Decreased size of LEFT ovary since previous exam. Aneurysmal dilatation ascending thoracic aorta 4.1 cm diameter, previously 3.8 cm, recommendation below. Recommend annual imaging followup by CTA or MRA. This recommendation follows 2010 ACCF/AHA/AATS/ACR/ASA/SCA/SCAI/SIR/STS/SVM Guidelines for the Diagnosis and Management of Patients with Thoracic Aortic Disease. Circulation. 2010; 121: N361-W431. Aortic aneurysm NOS (ICD10-I71.9) . Electronically Signed   By: Lavonia Dana M.D.   On: 04/01/2021 17:17   DG Foot Complete Right  Result Date: 04/01/2021 CLINICAL DATA:  Fall EXAM: RIGHT FOOT COMPLETE - 3+ VIEW COMPARISON:  None. FINDINGS: There is no evidence of fracture or dislocation. There is no evidence of arthropathy or other focal bone abnormality. Soft tissues are unremarkable. IMPRESSION: Negative. Electronically Signed   By: Ulyses Jarred M.D.   On: 04/01/2021 19:09   DG Hip Unilat With Pelvis 2-3 Views Right  Result Date: 04/01/2021 CLINICAL DATA:  Fall EXAM: DG HIP (WITH OR WITHOUT PELVIS) 2-3V RIGHT COMPARISON:  None. FINDINGS: There is no evidence of hip fracture or dislocation. There is no evidence of  arthropathy or other focal bone abnormality. IMPRESSION: Negative. Electronically Signed   By: Ulyses Jarred M.D.   On: 04/01/2021 19:10    Procedures Procedures   Medications Ordered in ED Medications  sodium chloride 0.9 % bolus 1,000 mL (has no administration in time range)    Followed by  0.9 %  sodium chloride infusion (has no administration in time range)  HYDROmorphone (DILAUDID) injection 1 mg (1 mg Intravenous Given 04/01/21 1540)  sodium chloride 0.9 % bolus 1,000 mL (0 mLs Intravenous Stopped 04/01/21 1752)  iohexol (OMNIPAQUE) 350 MG/ML injection 100 mL (100 mLs Intravenous Contrast Given 04/01/21 1629)  morphine 4 MG/ML injection 4 mg (4 mg Intravenous Given 04/01/21 1750)    ED Course  I have reviewed the triage vital signs and the nursing notes.  Pertinent labs & imaging results that were available during my care of the patient were reviewed by me and considered in my medical decision making (see chart for details).  Clinical Course as of 04/01/21 1943  Mon Apr 01, 2021  1547 Notified repeat pressure is low.  Will add on chest abd pelvis ct [JK]  1637 Still having pain.  Patient requested additional medications [JK]  1839 Labs reviewed, creatinine elevated but similar to baseline [JK]  1840 Hemoglobin similar to previous values [JK]    Clinical Course User Index [JK] Dorie Rank, MD   MDM Rules/Calculators/A&P                          Patient presented to the ED for evaluation after a fall.  Patient primarily is complaining of pain on her right side.  She did not initially complain of any chest pain or abdominal pain.  I did add on CT scans of her chest abdomen pelvis because she had an episode of hypotension while she was in the ED.  Patient was given IV fluids.  Her blood pressures returned to baseline.  Patient's not showing any signs of fevers chills to suggest infection.  Her blood pressure has remained stable.  I doubt any acute cardiac issue.  Patient's x-rays  fortunately did not show any signs of severe injury.  She does have an obvious ankle sprain  on exam.  Will discharge home with crutches and an ASO splint.  Patient does have regular prescriptions for Percocet.  Also give her additional naproxen. Final Clinical Impression(s) / ED Diagnoses Final diagnoses:  Trauma  Sprain of right ankle, unspecified ligament, initial encounter  Fall, initial encounter  Thoracic aortic aneurysm without rupture ([place])    Rx / DC Orders ED Discharge Orders          Ordered    naproxen (NAPROSYN) 375 MG tablet  2 times daily        04/01/21 1943             Dorie Rank, MD 04/01/21 1944

## 2021-04-01 NOTE — ED Notes (Signed)
RN reviewed discharge instructions w/ pt. Follow up, brace & crutches, and pain management reviewed. Pt had no further questions

## 2021-04-01 NOTE — ED Triage Notes (Signed)
To ED from home, via [facility], for eval of right hip pain after falling down 4 steps. Pt was ambulatory at home. Per EMS once pt on their stretcher they may feel deformity and increased pain. Pt has a chronic trach. No head injury. Alert and oriented.

## 2021-04-01 NOTE — ED Notes (Signed)
Ortho tech called 

## 2021-04-02 ENCOUNTER — Encounter: Payer: Self-pay | Admitting: Orthopedic Surgery

## 2021-04-03 ENCOUNTER — Other Ambulatory Visit: Payer: Self-pay

## 2021-04-03 ENCOUNTER — Encounter: Payer: Self-pay | Admitting: Family Medicine

## 2021-04-03 DIAGNOSIS — G4734 Idiopathic sleep related nonobstructive alveolar hypoventilation: Secondary | ICD-10-CM

## 2021-04-03 DIAGNOSIS — M5126 Other intervertebral disc displacement, lumbar region: Secondary | ICD-10-CM

## 2021-04-03 DIAGNOSIS — M5136 Other intervertebral disc degeneration, lumbar region: Secondary | ICD-10-CM

## 2021-04-03 MED ORDER — MISC. DEVICES MISC: 0 refills | Status: AC

## 2021-04-10 ENCOUNTER — Ambulatory Visit (INDEPENDENT_AMBULATORY_CARE_PROVIDER_SITE_OTHER): Payer: 59 | Admitting: Endocrinology

## 2021-04-10 ENCOUNTER — Other Ambulatory Visit: Payer: Self-pay

## 2021-04-10 VITALS — BP 124/76 | HR 91 | Ht 61.0 in | Wt 236.0 lb

## 2021-04-10 DIAGNOSIS — E1022 Type 1 diabetes mellitus with diabetic chronic kidney disease: Secondary | ICD-10-CM

## 2021-04-10 DIAGNOSIS — E1069 Type 1 diabetes mellitus with other specified complication: Secondary | ICD-10-CM

## 2021-04-10 DIAGNOSIS — E1042 Type 1 diabetes mellitus with diabetic polyneuropathy: Secondary | ICD-10-CM | POA: Diagnosis not present

## 2021-04-10 DIAGNOSIS — N183 Chronic kidney disease, stage 3 unspecified: Secondary | ICD-10-CM | POA: Diagnosis not present

## 2021-04-10 DIAGNOSIS — E785 Hyperlipidemia, unspecified: Secondary | ICD-10-CM

## 2021-04-10 DIAGNOSIS — E119 Type 2 diabetes mellitus without complications: Secondary | ICD-10-CM

## 2021-04-10 DIAGNOSIS — T383X5A Adverse effect of insulin and oral hypoglycemic [antidiabetic] drugs, initial encounter: Secondary | ICD-10-CM | POA: Diagnosis not present

## 2021-04-10 DIAGNOSIS — R110 Nausea: Secondary | ICD-10-CM

## 2021-04-10 DIAGNOSIS — Z794 Long term (current) use of insulin: Secondary | ICD-10-CM

## 2021-04-10 LAB — POCT GLYCOSYLATED HEMOGLOBIN (HGB A1C): Hemoglobin A1C: 8 % — AB (ref 4.0–5.6)

## 2021-04-10 MED ORDER — OZEMPIC (0.25 OR 0.5 MG/DOSE) 2 MG/1.5ML ~~LOC~~ SOPN: 0.5000 mg | PEN_INJECTOR | SUBCUTANEOUS | 3 refills | Status: DC

## 2021-04-10 MED ORDER — TRESIBA FLEXTOUCH 200 UNIT/ML ~~LOC~~ SOPN: 160.0000 [IU] | PEN_INJECTOR | Freq: Every day | SUBCUTANEOUS | 3 refills | Status: DC

## 2021-04-10 NOTE — Patient Instructions (Addendum)
I have sent 2 prescriptions to your pharmacy: to increase the Tresiba to 160 units per day, and to half the Ozempic check your blood sugar twice a day.  vary the time of day when you check, between before the 3 meals, and at bedtime.  also check if you have symptoms of your blood sugar being too high or too low.  please keep a record of the readings and bring it to your next appointment here (or you can bring the meter itself).  You can write it on any piece of paper.  please call us sooner if your blood sugar goes below 70, or if you have a lot of readings over 200. Please come back for a follow-up appointment in 3 months.

## 2021-04-10 NOTE — Progress Notes (Signed)
Subjective:    Patient ID: [name], female    DOB: 1970-12-18, 50 y.o.   MRN: [id number]  HPI Pt returns for f/u of diabetes mellitus: DM type: 1 Dx'ed: 4888 Complications: stage 3 CRI and PN Therapy: insulin since 2012, and Ozempic.   GDM: never DKA: once, in 2020 Severe hypoglycemia: never.   Pancreatitis: never.   Other: she declines weight loss surgery; edema precludes pioglitizone rx; she did not tolerate metformin (diarrhea); she is not a candidate for multiple daily injections, due to missing insulin doses.    Interval history: Pt says she now seldom misses meds.  Pt states cbg varies from 107-397. There is no trend throughout the day.  She has ongoing nausea.    Past Medical History:  Diagnosis Date   Allergy    Anxiety    Arthritis    Asthma    Chronic back pain    Chronic chest pain    resolved, no problems since 2019 per patient 07/27/19   COPD (chronic obstructive pulmonary disease) ([place])    Depression    DM (diabetes mellitus) ([place])    INSULIN DEPENDENT - TYPE 1   GERD (gastroesophageal reflux disease)    Headache(784.0)    Hyperlipidemia    no med, diet controlled   Hypertension    Hypokalemia    Respiratory disease 05/2017   uses inhalers, neb tx prn, no oxygen   Sleep apnea    does not use CPAP due to trach   Tracheostomy in place [facility]) 02/2017    Past Surgical History:  Procedure Laterality Date   ABDOMINAL HYSTERECTOMY  2009   APPENDECTOMY     CESAREAN SECTION     x 3   CHOLECYSTECTOMY N/A 03/02/2014   Procedure: LAPAROSCOPIC CHOLECYSTECTOMY;  Surgeon: Joyice Faster. Cornett, MD;  Location: [facility];  Service: General;  Laterality: N/A;   HERNIA REPAIR     PANENDOSCOPY N/A 03/04/2017   Procedure: PANENDOSCOPY WITH POSSIBLE FOREIGN BODY REMOVAL;  Surgeon: Jodi Marble, MD;  Location: [place];  Service: ENT;  Laterality: N/A;   ROTATOR CUFF REPAIR Left    ROTATOR CUFF REPAIR Right 07/27/2019   SHOULDER ARTHROSCOPY WITH SUBACROMIAL  DECOMPRESSION, ROTATOR CUFF REPAIR AND BICEP TENDON REPAIR Right 07/28/2019   Procedure: RIGHT SHOULDER ARTHROSCOP, MINI OPEN ROTATOR CUFF TEAR REPAIR,  BICEPS TENODESIS, DISTAL CLAVICLE EXCISION;  Surgeon: Meredith Pel, MD;  Location: [place];  Service: Orthopedics;  Laterality: Right;   TOOTH EXTRACTION N/A 02/22/2021   Procedure: DENTAL RESTORATION/EXTRACTIONS;  Surgeon: Diona Browner, DMD;  [facility];  Service: Oral Surgery;  Laterality: N/A;   TRACHEOSTOMY  02/2017   TUBAL LIGATION     VESICOVAGINAL FISTULA CLOSURE W/ TAH  2009    Social History   Socioeconomic History   Marital status: Married    Spouse name: Journalist, newspaper   Number of children: Not on file   Years of education: Not on file   Highest education level: Not on file  Occupational History   Not on file  Tobacco Use   Smoking status: Former    Packs/day: 0.50    Years: 25.00    Pack years: 12.50    Types: Cigarettes    Quit date: 01/21/2016    Years since quitting: 5.2   Smokeless tobacco: Never  Vaping Use   Vaping Use: Never used  Substance and Sexual Activity   Alcohol use: Yes    Alcohol/week: 0.0 standard drinks    Comment: social wine  Drug use: Yes    Types: Marijuana    Comment: last used 1 weeks ago   Sexual activity: Yes    Partners: Male    Birth control/protection: Surgical    Comment: Hysterectomy  Other Topics Concern   Not on file  Social History Narrative   Lives with husband   Social Determinants of Health   Financial Resource Strain: Not on file  Food Insecurity: No Food Insecurity   Worried About Charity fundraiser in the Last Year: Never true   [place] in the Last Year: Never true  Transportation Needs: No Transportation Needs   Lack of Transportation (Medical): No   Lack of Transportation (Non-Medical): No  Physical Activity: Not on file  Stress: Not on file  Social Connections: Not on file  Intimate Partner Violence: Not on file    Current Outpatient  Medications on File Prior to Visit  Medication Sig Dispense Refill   albuterol (PROAIR HFA) 108 (90 Base) MCG/ACT inhaler Inhale 2 puffs into the lungs every 6 (six) hours as needed for wheezing or shortness of breath. 18 g 6   albuterol (PROVENTIL) (2.5 MG/3ML) 0.083% nebulizer solution Take 3 mLs (2.5 mg total) by nebulization every 6 (six) hours as needed for wheezing or shortness of breath. 75 mL 3   atorvastatin (LIPITOR) 20 MG tablet Take 1 tablet (20 mg total) by mouth daily. 90 tablet 1   Blood Glucose Monitoring Suppl (ONETOUCH VERIO) w/Device KIT 1 each by Does not apply route daily. Use to monitor glucose levels daily; E11.9 1 kit 0   buPROPion (WELLBUTRIN SR) 150 MG 12 hr tablet Take 1 tablet (150 mg total) by mouth 2 (two) times daily. 180 tablet 1   cetirizine (ZYRTEC) 10 MG tablet Take 1 tablet (10 mg total) by mouth at bedtime. 30 tablet 5   dapagliflozin propanediol (FARXIGA) 5 MG TABS tablet Take 1 tablet (5 mg total) by mouth daily before breakfast. 30 tablet 3   diclofenac Sodium (VOLTAREN) 1 % GEL Apply 4 g topically 4 (four) times daily. 100 g 1   furosemide (LASIX) 20 MG tablet Take 1 tablet (20 mg total) by mouth daily. 30 tablet 2   gabapentin (NEURONTIN) 300 MG capsule Take 2 capsules (600 mg total) by mouth 2 (two) times daily. 360 capsule 1   glucose blood (ONETOUCH VERIO) test strip Use to monitor glucose levels daily; E11.9 100 each 2   hydrOXYzine (ATARAX/VISTARIL) 25 MG tablet Take 1 tablet (25 mg total) by mouth 3 (three) times daily as needed for anxiety. 180 tablet 1   Insulin Pen Needle 31G X 8 MM MISC Inject 1 Units as directed daily.     Melatonin 3 MG TABS Take 3 mg by mouth at bedtime as needed (for sleep).      methocarbamol (ROBAXIN) 750 MG tablet Take 750 mg by mouth 3 (three) times daily.     Misc. Devices MISC Nebulizer device. Diagnosis - Asthma 1 each 0   Misc. Devices [employer].  Diagnosis-low back pain 1 each 0   Misc. Devices MISC Shower chair 1  each 0   mometasone-formoterol (DULERA) 200-5 MCG/ACT AERO Inhale 2 puffs into the lungs in the morning and at bedtime. 1 each 6   ondansetron (ZOFRAN) 4 MG tablet Take 1 tablet (4 mg total) by mouth every 8 (eight) hours as needed for nausea or vomiting. 30 tablet 0   OneTouch Delica Lancets 32Y MISC 1 each by Does not  apply route daily. Use to monitor glucose levels daily; E11.9 100 each 2   pantoprazole (PROTONIX) 40 MG tablet Take 1 tablet (40 mg total) by mouth 2 (two) times daily. 60 tablet 0   sertraline (ZOLOFT) 100 MG tablet Take 1 tablet (100 mg total) by mouth at bedtime. 90 tablet 1   silver sulfADIAZINE (SILVADENE) 1 % cream Apply 1 application topically daily. 50 g 1   sucralfate (CARAFATE) 1 g tablet Take 1 tablet (1 g total) by mouth 4 (four) times daily -  with meals and at bedtime. 120 tablet 1   SUMAtriptan (IMITREX) 100 MG tablet At the onset of a migraine; may repeat in 2 hours. Max daily dose 23m (Patient taking differently: Take 100 mg by mouth every 2 (two) hours as needed for migraine.) 20 tablet 6   tiZANidine (ZANAFLEX) 4 MG tablet Take 4 mg by mouth every 8 (eight) hours as needed for muscle spasms.     topiramate (TOPAMAX) 100 MG tablet Take 1.5 tablets (150 mg total) by mouth 2 (two) times daily. 90 tablet 0   [DISCONTINUED] fluticasone-salmeterol (ADVAIR HFA) 230-21 MCG/ACT inhaler Inhale 2 puffs into the lungs 2 (two) times daily. 1 Inhaler 3   No current facility-administered medications on file prior to visit.    Allergies  Allergen Reactions   Reglan [Metoclopramide] Other (See Comments)    Panic attack    Family History  Problem Relation Age of Onset   Heart attack Mother    Stroke Mother    Diabetes Mother    Hypertension Mother    Arthritis Mother    Stroke Father    Asthma Sister    Hypertension Sister    Asthma Sister    Diabetes Sister    Asthma Brother    Seizures Brother    Asthma Brother    Diabetes Brother    Asthma Daughter     Asthma Son    Asthma Grandson    Colon cancer Neg Hx    Rectal cancer Neg Hx    Stomach cancer Neg Hx     BP 124/76 (BP Location: Left Arm, Patient Position: Sitting, Cuff Size: Large)   Pulse 91   Ht _0  (1.549 m)   Wt 236 lb (107 kg)   LMP  (LMP Unknown)   SpO2 97%   BMI 44.59 kg/m    Review of Systems She denies hypoglycemia    Objective:   Physical Exam Pulses: dorsalis pedis intact bilat.   MSK: no deformity of the feet CV: trace bilat leg edema.  Skin:  no ulcer on the feet.  normal color and temp on the feet. Neuro: sensation is intact to touch on the feet  Lab Results  Component Value Date   HGBA1C 8.0 (A) 04/10/2021      Assessment & Plan:  Type 1 DM: uncontrolled Nausea, due to Ozempic  Patient Instructions  I have sent 2 prescriptions to your pharmacy: to increase the Tresiba to 160 units per day, and to half the Ozempic check your blood sugar twice a day.  vary the time of day when you check, between before the 3 meals, and at bedtime.  also check if you have symptoms of your blood sugar being too high or too low.  please keep a record of the readings and bring it to your next appointment here (or you can bring the meter itself).  You can write it on any piece of paper.  please call [email or web address]  if your blood sugar goes below 70, or if you have a lot of readings over 200. Please come back for a follow-up appointment in 3 months.

## 2021-04-10 NOTE — Telephone Encounter (Signed)
Spoke with pt who stated O2 has already been delivered and she has already stated using it. Pt states no other needs at this time.

## 2021-04-18 ENCOUNTER — Ambulatory Visit (INDEPENDENT_AMBULATORY_CARE_PROVIDER_SITE_OTHER): Payer: 59 | Admitting: Orthopedic Surgery

## 2021-04-18 ENCOUNTER — Telehealth: Payer: Self-pay

## 2021-04-18 ENCOUNTER — Other Ambulatory Visit: Payer: Self-pay

## 2021-04-18 ENCOUNTER — Encounter: Payer: Self-pay | Admitting: Orthopedic Surgery

## 2021-04-18 DIAGNOSIS — S93491A Sprain of other ligament of right ankle, initial encounter: Secondary | ICD-10-CM

## 2021-04-18 DIAGNOSIS — M5410 Radiculopathy, site unspecified: Secondary | ICD-10-CM | POA: Diagnosis not present

## 2021-04-18 NOTE — Progress Notes (Signed)
Office Visit Note   Patient: [name]           Date of Birth: Nov 21, 1970           MRN: [id number] Visit Date: 04/18/2021 Requested by: [name], MD [place],  [place] [address] PCP: [name], MD  Subjective: Chief Complaint  Patient presents with   Right Ankle - Pain    HPI: [name] is a 50 y.o. female who presents to the office complaining of back pain and right ankle pain.  Patient states that she fell down 4 stairs on 04/01/2021.  She was seen in the emergency department where she had radiographs of her hip, knee, ankle with no fractures found.  She could not weight-bear on her ankle initially but now it is much improved and she is able to weight-bear with the assistance of a cane for balance.  She is in an ASO brace.  She is taking oxycodone, gabapentin, muscle relaxer.  Due to her recent injury, she has not been to physical therapy where she does aquarobics.  Additionally, patient complains of lumbar spine pain with radicular pain that travels into both buttocks and down the left leg to the posterior knee and down the right leg to the bottom of the foot.  Right radicular pain is more bothersome than her left radicular pain and she is not able to sit on her right buttocks without significant discomfort.  This pain is waking her up every night is worse since her fall.  She does have a history of back pain and lumbar radiculopathy with prior ESI's that have never provided any lasting relief for her.  Last MRI scan of the lumbar spine was on 12/09/2020 that revealed moderate spinal stenosis at L4-L5 with progressive central disc protrusion.                ROS: All systems reviewed are negative as they relate to the chief complaint within the history of present illness.  Patient denies fevers or chills.  Assessment & Plan: Visit Diagnoses:  1. Radicular pain of right lower extremity   2. Sprain of anterior talofibular  ligament of right ankle, initial encounter     Plan: Patient is a 50 year old female who presents complaining of right ankle and low back pain following a fall down 4 stairs.  Ankle is improving and radiographs from the emergency department visit were negative.  She does still have some continued pain so plan to follow-up in 4 weeks for clinical recheck and if no improvement, consider MRI of the ankle for further evaluation.  She will continue to use topical Voltaren and take Aleve until then.  Opted against the use of Medrol Dosepak due to her history of diabetes with last A1c 8.0.  Regarding her low back pain and radicular pain, she has recent MRI scan back in March with moderate spinal stenosis at L4-L5 with central disc protrusion.  She states that injections have provided no significant relief for her over the last several injections and she would like to discuss her options with a spine surgeon.  Plan to refer to Dr. Louanne Skye for further discussion of operative vs. Nonoperative solutions based on her recent scan.    Follow-Up Instructions: No follow-ups on file.   Orders:  No orders of the defined types were placed in this encounter.  No orders of the defined types were placed in this encounter.     Procedures: No procedures performed  Clinical Data: No additional findings.  Objective: Vital Signs: LMP  (LMP Unknown)   Physical Exam:  Constitutional: Patient appears well-developed HEENT:  Head: Normocephalic Eyes:EOM are normal Neck: Normal range of motion Cardiovascular: Normal rate Pulmonary/chest: Effort normal Neurologic: Patient is alert Skin: Skin is warm Psychiatric: Patient has normal mood and affect  Ortho Exam: Ortho exam demonstrates straight leg raise positive bilaterally.  She has 5/5 motor strength of bilateral hip flexion, quadricep, plantar flexion, left hamstring, left dorsiflexion.  5 -/5 strength of right hamstring and right dorsiflexion.  No evidence of  clonus on exam today.  She is posturing to take pressure off of her right buttocks.  Right ankle with increased swelling over the lateral aspect of the ankle compared with the contralateral ankle.  Intact dorsiflexion, plantarflexion, inversion, eversion though all of these are limited by pain.  ATFL is palpable though mildly tender.  Achilles tendon intact palpation with no change in contour compared with contralateral ankle.  Tenderness over the lateral malleolus, ATFL, CFL, fifth metatarsal base, Achilles tendon, ATFL.  Ambulates with antalgic gait.  Specialty Comments:  No specialty comments available.  Imaging: No results found.   PMFS History: Patient Active Problem List   Diagnosis Date Noted   Abdominal pain, epigastric 11/28/2020   Nausea and vomiting 11/28/2020   COVID-19 09/16/2020   Dyspnea 08/26/2020   Rotator cuff tear 07/28/2019   Insulin dependent type 2 diabetes mellitus ([place]) 04/12/2019   Chest pain of uncertain etiology 123456   Lactic acidosis 03/22/2019   Chronic right shoulder pain    Community acquired pneumonia 10/25/2018   Asthma, chronic, unspecified asthma severity, with acute exacerbation 10/25/2018   Acute respiratory failure with hypoxia and hypercapnia (H[facility]) 10/23/2018   Acute pain of right shoulder due to trauma 10/05/2018   Chronic bilateral low back pain without sciatica 10/05/2018   Dysphonia 10/05/2018   Severe asthma with exacerbation 10/05/2018   Rotator cuff arthropathy, right 06/15/2018   AKI (acute kidney injury) ([place]) 03/06/2018   Carpal tunnel syndrome 01/18/2018   Heart failure ([place]) 05/20/2017   Normocytic anemia 05/06/2017   Tracheostomy dependent ([place]) 03/26/2017   Dysphagia 03/26/2017   Status post tracheostomy ([place]) 03/14/2017   Klebsiella cystitis 03/14/2017   Morbid obesity ([place]) 03/13/2017   Subglottic stenosis 03/06/2017   Asthma 03/06/2017   Chronic diastolic heart failure ([place]) 03/06/2017   Tracheal stenosis  03/04/2017   Acute blood loss anemia    Generalized anxiety disorder    Chronic pain syndrome    GERD (gastroesophageal reflux disease) 02/22/2016   Depression 02/22/2016   Migraine 01/30/2015   Bulging lumbar disc 09/05/2013   Essential hypertension 09/05/2013   Allergic rhinitis, seasonal 08/11/2012   Chronic cough 08/11/2012   OSA (obstructive sleep apnea) 12/04/2011   Past Medical History:  Diagnosis Date   Allergy    Anxiety    Arthritis    Asthma    Chronic back pain    Chronic chest pain    resolved, no problems since 2019 per patient 07/27/19   COPD (chronic obstructive pulmonary disease) ([place])    Depression    DM (diabetes mellitus) ([place])    INSULIN DEPENDENT - TYPE 1   GERD (gastroesophageal reflux disease)    Headache(784.0)    Hyperlipidemia    no med, diet controlled   Hypertension    Hypokalemia    Respiratory disease 05/2017   uses inhalers, neb tx prn, no oxygen   Sleep apnea    does not use  CPAP due to trach   Tracheostomy in place [facility]) 02/2017    Family History  Problem Relation Age of Onset   Heart attack Mother    Stroke Mother    Diabetes Mother    Hypertension Mother    Arthritis Mother    Stroke Father    Asthma Sister    Hypertension Sister    Asthma Sister    Diabetes Sister    Asthma Brother    Seizures Brother    Asthma Brother    Diabetes Brother    Asthma Daughter    Asthma Son    Asthma Grandson    Colon cancer Neg Hx    Rectal cancer Neg Hx    Stomach cancer Neg Hx     Past Surgical History:  Procedure Laterality Date   ABDOMINAL HYSTERECTOMY  2009   APPENDECTOMY     CESAREAN SECTION     x 3   CHOLECYSTECTOMY N/A 03/02/2014   Procedure: LAPAROSCOPIC CHOLECYSTECTOMY;  Surgeon: Joyice Faster. Cornett, MD;  Location: [place] [facility];  Service: General;  Laterality: N/A;   HERNIA REPAIR     PANENDOSCOPY N/A 03/04/2017   Procedure: PANENDOSCOPY WITH POSSIBLE FOREIGN BODY REMOVAL;  Surgeon: Jodi Marble, MD;  Location: [facility];  Service:  ENT;  Laterality: N/A;   ROTATOR CUFF REPAIR Left    ROTATOR CUFF REPAIR Right 07/27/2019   SHOULDER ARTHROSCOPY WITH SUBACROMIAL DECOMPRESSION, ROTATOR CUFF REPAIR AND BICEP TENDON REPAIR Right 07/28/2019   Procedure: RIGHT SHOULDER ARTHROSCOP, MINI OPEN ROTATOR CUFF TEAR REPAIR,  BICEPS TENODESIS, DISTAL CLAVICLE EXCISION;  Surgeon: Meredith Pel, MD;  Location: [place];  Service: Orthopedics;  Laterality: Right;   TOOTH EXTRACTION N/A 02/22/2021   Procedure: DENTAL RESTORATION/EXTRACTIONS;  Surgeon: Diona Browner, DMD;  [facility];  Service: Oral Surgery;  Laterality: N/A;   TRACHEOSTOMY  02/2017   TUBAL LIGATION     VESICOVAGINAL FISTULA CLOSURE W/ TAH  2009   Social History   Occupational History   Not on file  Tobacco Use   Smoking status: Former    Packs/day: 0.50    Years: 25.00    Pack years: 12.50    Types: Cigarettes    Quit date: 01/21/2016    Years since quitting: 5.2   Smokeless tobacco: Never  Vaping Use   Vaping Use: Never used  Substance and Sexual Activity   Alcohol use: Yes    Alcohol/week: 0.0 standard drinks    Comment: social wine   Drug use: Yes    Types: Marijuana    Comment: last used 1 weeks ago   Sexual activity: Yes    Partners: Male    Birth control/protection: Surgical    Comment: Hysterectomy

## 2021-04-18 NOTE — Telephone Encounter (Signed)
[name] would like patient to see Dr Louanne Skye for lumbar spine.

## 2021-04-19 ENCOUNTER — Encounter: Payer: Self-pay | Admitting: Orthopedic Surgery

## 2021-04-21 IMAGING — CT CT ANGIO CHEST
2 of 6 series · 18 of 36 positions shown · IV contrast (omnipaque)
Comparison: 08/26/2020

CLINICAL DATA: Progressive dyspnea

EXAM:
CT ANGIOGRAPHY CHEST WITH CONTRAST
TECHNIQUE: Multidetector CT imaging of the chest was performed using the
standard protocol during bolus administration of intravenous
contrast. Multiplanar CT image reconstructions and MIPs were
obtained to evaluate the vascular anatomy.
CONTRAST:  80mL OMNIPAQUE IOHEXOL 350 MG/ML SOLN

[Series 7: pe thins · axial · 0.78mm/px · z∈[+1289,+1520]mm · 17 of 367 slices shown]
[im 19/367  lung]
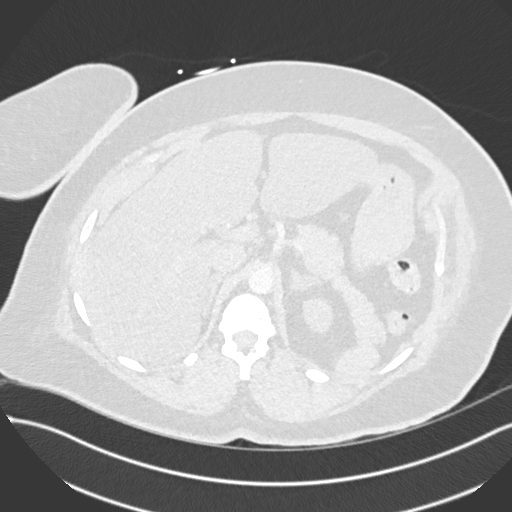
[im 37/367  mediastinal]
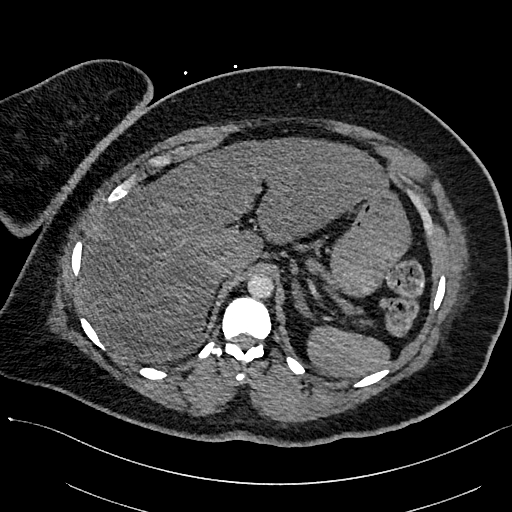
[im 55/367  lung]
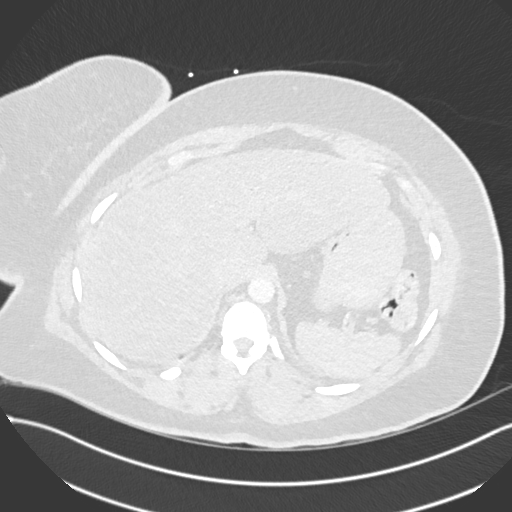
[im 74/367  mediastinal]
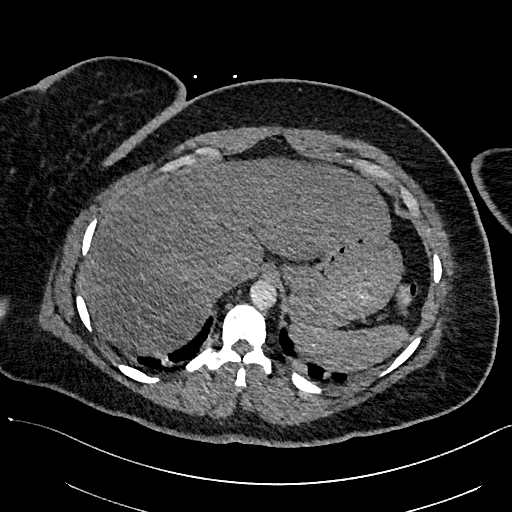
[im 110/367  lung]
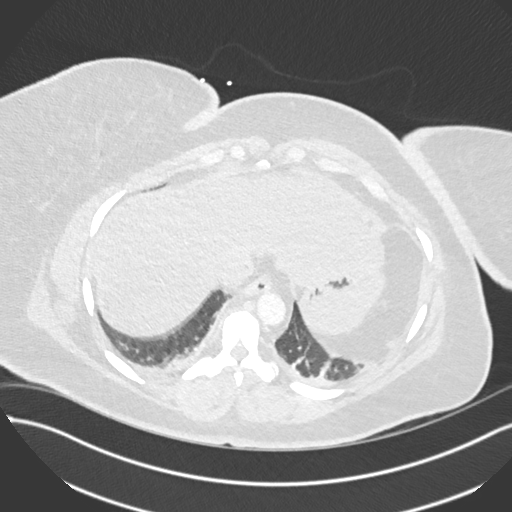
[im 129/367  mediastinal]
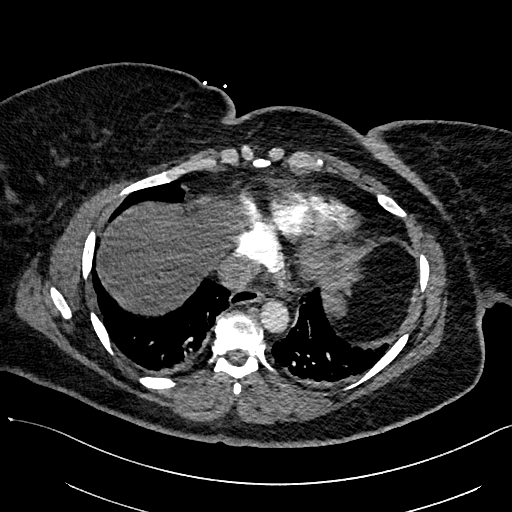
[im 147/367  lung]
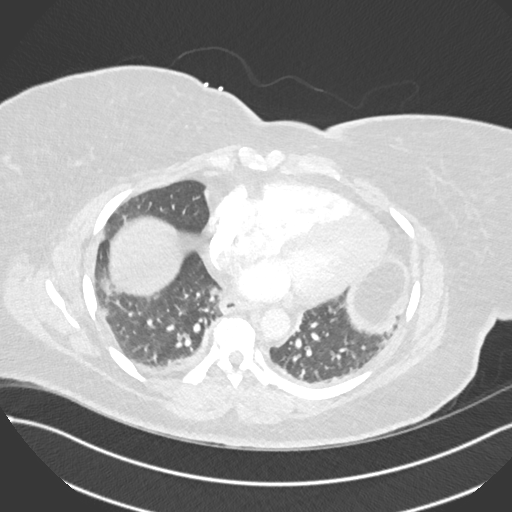
[im 165/367  mediastinal]
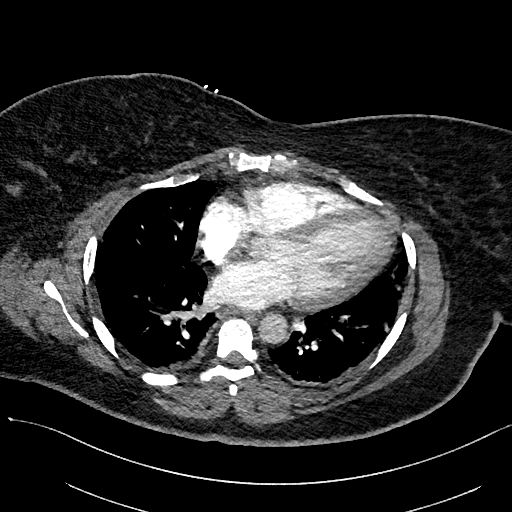
[im 184/367  lung]
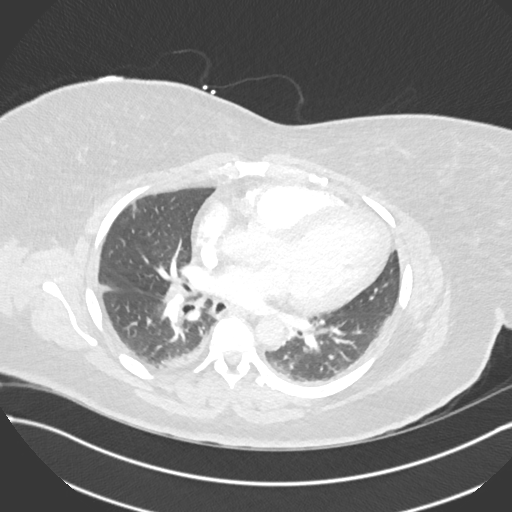
[im 202/367  mediastinal]
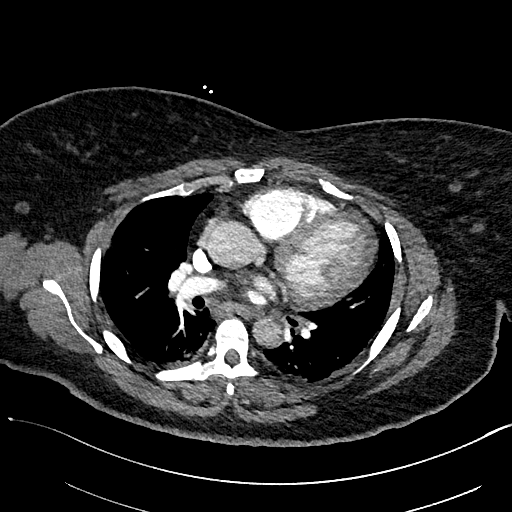
[im 220/367  lung]
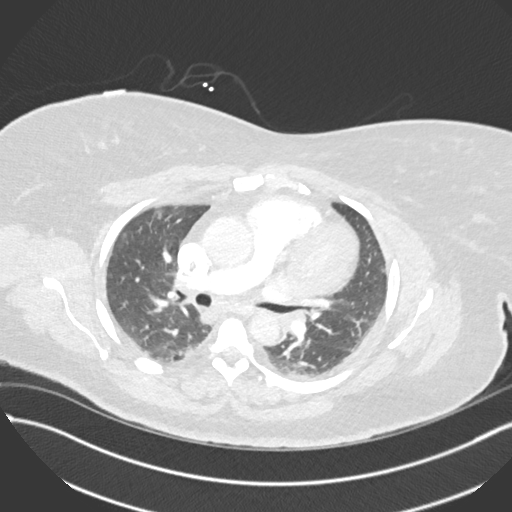
[im 238/367  mediastinal]
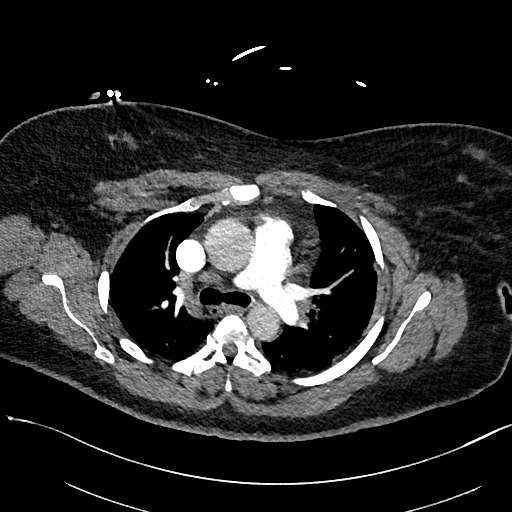
[im 257/367  lung]
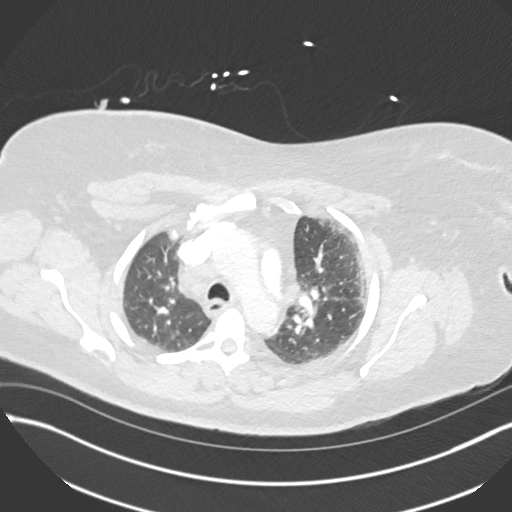
[im 293/367  mediastinal]
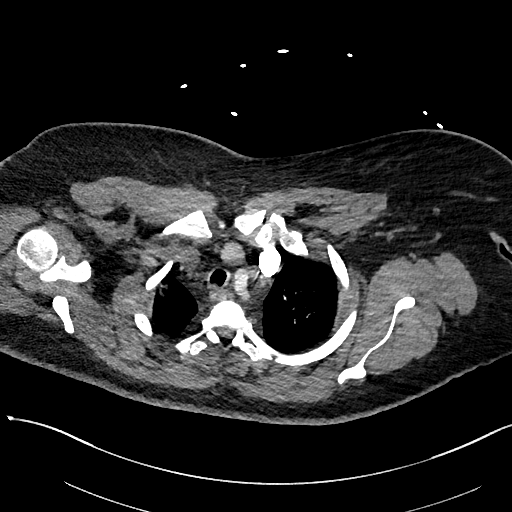
[im 312/367  lung]
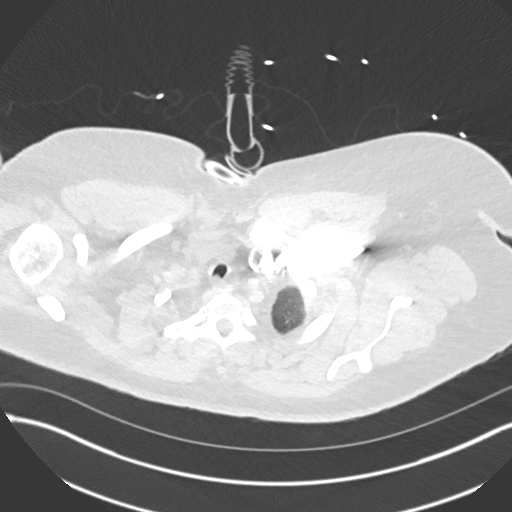
[im 330/367  mediastinal]
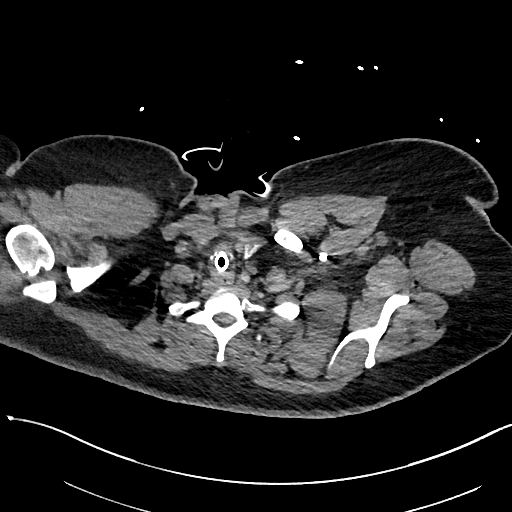
[im 348/367  lung]
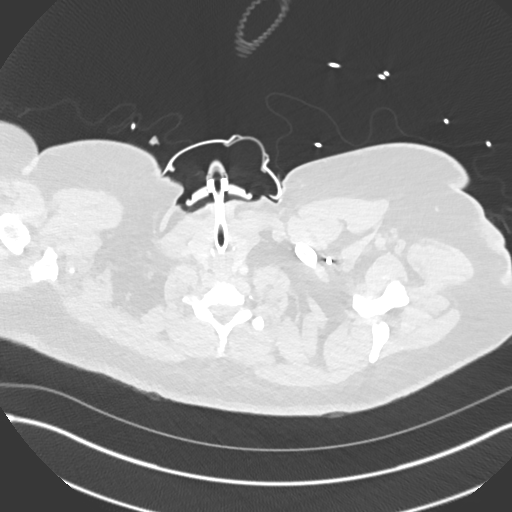

[Series 8: pe 2mm cor · coronal · 0.53mm/px · 1 of 134 slices shown]
[im 67/134  mediastinal]
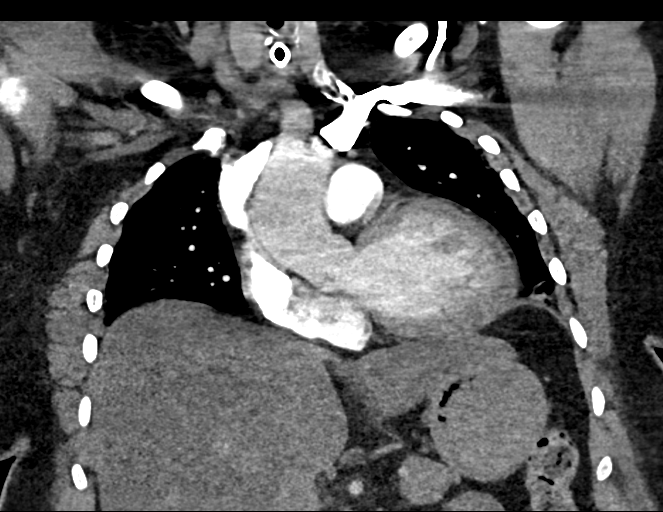

[18 of 36 positions shown; findings below may reference images not displayed]

FINDINGS: Cardiovascular: Satisfactory opacification of the pulmonary arteries
to the segmental level. No evidence of pulmonary embolism. Normal
heart size. No pericardial effusion.

Mediastinum/Nodes: Tracheostomy in expected position. Thyroid
unremarkable. Enlarged right paratracheal lymph node is stable from
a multiple prior examinations dating as far back as 03/05/2018,
likely benign given its stability over time. No new pathologic
thoracic adenopathy. The esophagus is unremarkable.

Lungs/Pleura: Pulmonary insufflation has slightly diminished since
prior examination. There has developed mild bilateral peripheral
diffuse subpleural ground-glass pulmonary infiltrate, likely
infectious or inflammatory and compatible with changes of acute
NC6V6-9X pneumonia. There is mild bibasilar atelectasis. No
pneumothorax or pleural effusion. No central obstructing lesion

Upper Abdomen: Moderate hepatic steatosis.  No acute abnormality.

Musculoskeletal: No acute bone abnormality.

Review of the MIP images confirms the above findings.
IMPRESSION: No pulmonary embolism.

Interval development of a bilateral subpleural ground-glass
pulmonary infiltrate, likely infectious or inflammatory and
compatible with changes of acute NC6V6-9X pneumonia. Serologic
correlation is recommended for further management.

## 2021-04-30 ENCOUNTER — Other Ambulatory Visit: Payer: Self-pay

## 2021-04-30 ENCOUNTER — Ambulatory Visit: Payer: 59 | Attending: Family Medicine | Admitting: Family Medicine

## 2021-04-30 ENCOUNTER — Telehealth: Payer: Self-pay | Admitting: Family Medicine

## 2021-04-30 ENCOUNTER — Encounter: Payer: Self-pay | Admitting: Family Medicine

## 2021-04-30 DIAGNOSIS — M5136 Other intervertebral disc degeneration, lumbar region: Secondary | ICD-10-CM

## 2021-04-30 DIAGNOSIS — R110 Nausea: Secondary | ICD-10-CM | POA: Diagnosis not present

## 2021-04-30 DIAGNOSIS — I10 Essential (primary) hypertension: Secondary | ICD-10-CM

## 2021-04-30 DIAGNOSIS — M5126 Other intervertebral disc displacement, lumbar region: Secondary | ICD-10-CM | POA: Diagnosis not present

## 2021-04-30 DIAGNOSIS — G43119 Migraine with aura, intractable, without status migrainosus: Secondary | ICD-10-CM

## 2021-04-30 DIAGNOSIS — K219 Gastro-esophageal reflux disease without esophagitis: Secondary | ICD-10-CM | POA: Diagnosis not present

## 2021-04-30 DIAGNOSIS — M25571 Pain in right ankle and joints of right foot: Secondary | ICD-10-CM

## 2021-04-30 MED ORDER — ONDANSETRON HCL 4 MG PO TABS: 4.0000 mg | ORAL_TABLET | Freq: Three times a day (TID) | ORAL | 1 refills | Status: DC | PRN

## 2021-04-30 MED ORDER — PANTOPRAZOLE SODIUM 40 MG PO TBEC: 40.0000 mg | DELAYED_RELEASE_TABLET | Freq: Two times a day (BID) | ORAL | 3 refills | Status: DC

## 2021-04-30 NOTE — Telephone Encounter (Signed)
Patient with anxiety and depression requesting counseling.

## 2021-04-30 NOTE — Progress Notes (Signed)
Virtual Visit via Video Note  I connected with Hubbell Bradley, on 04/30/2021 at 3:38 PM by video enabled telemedicine device due to the COVID-19 pandemic and verified that I am speaking with the correct person using two identifiers.   Consent: I discussed the limitations, risks, security and privacy concerns of performing an evaluation and management service by telemedicine and the availability of in person appointments. I also discussed with the patient that there may be a patient responsible charge related to this service. The patient expressed understanding and agreed to proceed.   Location of Patient: Home  Location of Provider: Clinic   Persons participating in Telemedicine visit: Luiza [name] [name] Dr. Margarita Rana     History of Present Illness: [name] is a 50 y.o. year old female  with a history of type 1 diabetes mellitus (A1c 8.0), hypertension, obstructive sleep apnea, asthma, depression, s/p tracheostomy secondary to intubation related tracheal injury, subglottic stenosis (status post balloon dilatation and Kenalog injection of stenosis tracheostomy tube exchange by ENT, status post right rotator cuff surgery in 07/2019.  On 04/01/21 she took a fall and hurt her R ankle and back.  Seen at the ED, x-ray negative for fracture and she had a follow-up with orthopedics-Dr. Marlou Sa when she was diagnosed with a right ankle sprain of anterior talofibular ligament. She still has an ankle brace on her R ankle and she ambulates with a cane Scheduled to see a Spine Specialist - Dr Louanne Skye; appointment comes up next week as her falls were thought to be increased by her underlying bulging disc and spinal stenosis.  Followed by Pain Management but she is in constant pain despite using her Narcotics ; next appointment is in 10 days. Pain causes her blood pressures to fluctuate.  She has nausea and vomiitng is requesting refill of  her Zofran. I had referred her to neurology for migraines uncontrolled on Topamax but she states at this time her migraines are controlled. Past Medical History:  Diagnosis Date   Allergy    Anxiety    Arthritis    Asthma    Chronic back pain    Chronic chest pain    resolved, no problems since 2019 per patient 07/27/19   COPD (chronic obstructive pulmonary disease) ([place])    Depression    DM (diabetes mellitus) ([place])    INSULIN DEPENDENT - TYPE 1   GERD (gastroesophageal reflux disease)    Headache(784.0)    Hyperlipidemia    no med, diet controlled   Hypertension    Hypokalemia    Respiratory disease 05/2017   uses inhalers, neb tx prn, no oxygen   Sleep apnea    does not use CPAP due to trach   Tracheostomy in place [facility]) 02/2017   Allergies  Allergen Reactions   Reglan [Metoclopramide] Other (See Comments)    Panic attack    Current Outpatient Medications on File Prior to Visit  Medication Sig Dispense Refill   albuterol (PROAIR HFA) 108 (90 Base) MCG/ACT inhaler Inhale 2 puffs into the lungs every 6 (six) hours as needed for wheezing or shortness of breath. 18 g 6   albuterol (PROVENTIL) (2.5 MG/3ML) 0.083% nebulizer solution Take 3 mLs (2.5 mg total) by nebulization every 6 (six) hours as needed for wheezing or shortness of breath. 75 mL 3   atorvastatin (LIPITOR) 20 MG tablet Take 1 tablet (20 mg total) by mouth daily. 90 tablet 1   Blood Glucose Monitoring Suppl (ONETOUCH VERIO) w/Device  KIT 1 each by Does not apply route daily. Use to monitor glucose levels daily; E11.9 1 kit 0   buPROPion (WELLBUTRIN SR) 150 MG 12 hr tablet Take 1 tablet (150 mg total) by mouth 2 (two) times daily. 180 tablet 1   cetirizine (ZYRTEC) 10 MG tablet Take 1 tablet (10 mg total) by mouth at bedtime. 30 tablet 5   dapagliflozin propanediol (FARXIGA) 5 MG TABS tablet Take 1 tablet (5 mg total) by mouth daily before breakfast. 30 tablet 3   diclofenac Sodium (VOLTAREN) 1 % GEL Apply 4 g  topically 4 (four) times daily. 100 g 1   furosemide (LASIX) 20 MG tablet Take 1 tablet (20 mg total) by mouth daily. 30 tablet 2   gabapentin (NEURONTIN) 300 MG capsule Take 2 capsules (600 mg total) by mouth 2 (two) times daily. 360 capsule 1   glucose blood (ONETOUCH VERIO) test strip Use to monitor glucose levels daily; E11.9 100 each 2   hydrOXYzine (ATARAX/VISTARIL) 25 MG tablet Take 1 tablet (25 mg total) by mouth 3 (three) times daily as needed for anxiety. 180 tablet 1   insulin degludec (TRESIBA FLEXTOUCH) 200 UNIT/ML FlexTouch Pen Inject 160 Units into the skin daily. 84 mL 3   Insulin Pen Needle 31G X 8 MM MISC Inject 1 Units as directed daily.     Melatonin 3 MG TABS Take 3 mg by mouth at bedtime as needed (for sleep).      methocarbamol (ROBAXIN) 750 MG tablet Take 750 mg by mouth 3 (three) times daily.     Misc. Devices MISC Nebulizer device. Diagnosis - Asthma 1 each 0   Misc. Devices [employer].  Diagnosis-low back pain 1 each 0   Misc. Devices MISC Shower chair 1 each 0   mometasone-formoterol (DULERA) 200-5 MCG/ACT AERO Inhale 2 puffs into the lungs in the morning and at bedtime. 1 each 6   ondansetron (ZOFRAN) 4 MG tablet Take 1 tablet (4 mg total) by mouth every 8 (eight) hours as needed for nausea or vomiting. 30 tablet 0   OneTouch Delica Lancets 33I MISC 1 each by Does not apply route daily. Use to monitor glucose levels daily; E11.9 100 each 2   pantoprazole (PROTONIX) 40 MG tablet Take 1 tablet (40 mg total) by mouth 2 (two) times daily. 60 tablet 0   Semaglutide,0.25 or 0.5MG/DOS, (OZEMPIC, 0.25 OR 0.5 MG/DOSE,) 2 MG/1.5ML SOPN Inject 0.5 mg into the skin once a week. 4.5 mL 3   sertraline (ZOLOFT) 100 MG tablet Take 1 tablet (100 mg total) by mouth at bedtime. 90 tablet 1   silver sulfADIAZINE (SILVADENE) 1 % cream Apply 1 application topically daily. 50 g 1   sucralfate (CARAFATE) 1 g tablet Take 1 tablet (1 g total) by mouth 4 (four) times daily -  with meals and at  bedtime. 120 tablet 1   SUMAtriptan (IMITREX) 100 MG tablet At the onset of a migraine; may repeat in 2 hours. Max daily dose 291m (Patient taking differently: Take 100 mg by mouth every 2 (two) hours as needed for migraine.) 20 tablet 6   tiZANidine (ZANAFLEX) 4 MG tablet Take 4 mg by mouth every 8 (eight) hours as needed for muscle spasms.     topiramate (TOPAMAX) 100 MG tablet Take 1.5 tablets (150 mg total) by mouth 2 (two) times daily. 90 tablet 0   [DISCONTINUED] fluticasone-salmeterol (ADVAIR HFA) 230-21 MCG/ACT inhaler Inhale 2 puffs into the lungs 2 (two) times daily. 1 Inhaler 3  No current facility-administered medications on file prior to visit.    ROS: See HPI  Observations/Objective: Awake, alert, oriented x3 Neck-tracheostomy tube in place, no JVD Respiration-not in acute distress Musculoskeletal-full range of motion of extremities Psych-normal mood  CMP Latest Ref Rng & Units 04/01/2021 04/01/2021 02/22/2021  Glucose 70 - 99 mg/dL 216(H) 219(H) 163(H)  BUN 6 - 20 mg/dL 15 12 21(H)  Creatinine 0.44 - 1.00 mg/dL 1.30(H) 1.26(H) 1.55(H)  Sodium 135 - 145 mmol/L 140 137 137  Potassium 3.5 - 5.1 mmol/L 4.0 4.0 3.9  Chloride 98 - 111 mmol/L 108 107 109  CO2 22 - 32 mmol/L - 23 23  Calcium 8.9 - 10.3 mg/dL - 8.7(L) 9.0  Total Protein 6.5 - 8.1 g/dL - - -  Total Bilirubin 0.3 - 1.2 mg/dL - - -  Alkaline Phos 38 - 126 U/L - - -  AST 15 - 41 U/L - - -  ALT 0 - 44 U/L - - -    Lipid Panel     Component Value Date/Time   CHOL 165 06/15/2018 1031   TRIG 124 06/15/2018 1031   HDL 51 06/15/2018 1031   CHOLHDL 3.2 06/15/2018 1031   CHOLHDL 3.9 02/22/2016 0954   VLDL 26 02/22/2016 0954   LDLCALC 89 06/15/2018 1031   LABVLDL 25 06/15/2018 1031    Lab Results  Component Value Date   HGBA1C 8.0 (A) 04/10/2021     Assessment and Plan: 1. Gastroesophageal reflux disease without esophagitis Controlled - pantoprazole (PROTONIX) 40 MG tablet; Take 1 tablet (40 mg total)  by mouth 2 (two) times daily.  Dispense: 60 tablet; Refill: 3  2. Bulging lumbar disc Coupled with underlying spinal stenosis Uncontrolled Advised to apply heat or ice whichever is tolerated to painful areas. Counseled on evidence of improvement in pain control with regards to yoga, water aerobics, massage, home physical therapy, exercise as tolerated. Keep upcoming appointment with pain management and spine specialist  3. Nausea Zofran has been refilled  4. Benign essential HTN Elevated blood pressure due to underlying pain Advised to keep a log of blood pressure and send to me via MyChart Will adjust regimen depending on blood pressure readings  5. Acute right ankle pain Secondary to fall She has talofibular ligament sprain Continue with right ankle brace Continue with NSAIDs  6. Intractable migraine with aura without status migrainosus Controlled   Follow Up Instructions: Keep previously scheduled appointment   I discussed the assessment and treatment plan with the patient. The patient was provided an opportunity to ask questions and all were answered. The patient agreed with the plan and demonstrated an understanding of the instructions.   The patient was advised to call back or seek an in-person evaluation if the symptoms worsen or if the condition fails to improve as anticipated.     I provided 20 minutes total of Telehealth time during this encounter including median intraservice time, reviewing previous notes, investigations, ordering medications, medical decision making, coordinating care and patient verbalized understanding at the end of the visit.     [name], MD, FAAFP. [facility] [place], [place]   04/30/2021, 3:38 PM

## 2021-05-06 NOTE — Telephone Encounter (Signed)
Spoke with pt and she mentioned that she was having car trouble and would call back. LCSWA gave contact info for call back.

## 2021-05-13 ENCOUNTER — Encounter (HOSPITAL_BASED_OUTPATIENT_CLINIC_OR_DEPARTMENT_OTHER): Payer: 59 | Admitting: Pulmonary Disease

## 2021-05-14 NOTE — Telephone Encounter (Signed)
Spoke with pt and scheduled virtual appt for 05/30/21 at 1:30pm

## 2021-05-16 ENCOUNTER — Ambulatory Visit: Payer: 59 | Admitting: Orthopedic Surgery

## 2021-05-19 ENCOUNTER — Telehealth: Payer: Self-pay

## 2021-05-19 NOTE — Telephone Encounter (Signed)
Called pt to schedule AWV, no answer, left vm to call back. 

## 2021-05-23 ENCOUNTER — Other Ambulatory Visit: Payer: Self-pay | Admitting: Family Medicine

## 2021-05-23 MED ORDER — TIZANIDINE HCL 4 MG PO TABS: 4.0000 mg | ORAL_TABLET | Freq: Three times a day (TID) | ORAL | 6 refills | Status: DC | PRN

## 2021-05-23 NOTE — Telephone Encounter (Signed)
Requested medication (s) are due for refill today: Yes  Requested medication (s) are on the active medication list: Yes  Last refill:  09/17/20  Future visit scheduled: No  Notes to clinic:  See request - historical provider.    Requested Prescriptions  Pending Prescriptions Disp Refills   tiZANidine (ZANAFLEX) 4 MG tablet [Pharmacy Med Name: tiZANidine HCl 4 MG Oral Tablet] 90 tablet 0    Sig: TAKE 1 TABLET BY MOUTH EVERY 8 HOURS AS NEEDED FOR MUSCLE SPASM     Not Delegated - Cardiovascular:  Alpha-2 Agonists - tizanidine Failed - 05/23/2021 10:24 AM      Failed - This refill cannot be delegated      Passed - Valid encounter within last 6 months    Recent Outpatient Visits           3 weeks ago Bulging lumbar disc   [place], Enobong, MD   2 months ago Superficial burn of breast, initial encounter   [place], Enobong, MD   5 months ago Need for hepatitis C screening test   [place], Charlane Ferretti, MD   10 months ago Type 1 diabetes mellitus with stage 3a chronic kidney disease ([place])   [place], Charlane Ferretti, MD   1 year ago Type 1 diabetes mellitus with stage 3a chronic kidney disease [facility])   [facility] [name], MD

## 2021-05-29 ENCOUNTER — Ambulatory Visit: Payer: Self-pay

## 2021-05-29 ENCOUNTER — Encounter: Payer: Self-pay | Admitting: Specialist

## 2021-05-29 ENCOUNTER — Ambulatory Visit (INDEPENDENT_AMBULATORY_CARE_PROVIDER_SITE_OTHER): Payer: 59 | Admitting: Specialist

## 2021-05-29 VITALS — BP 132/87 | HR 87 | Ht 61.0 in | Wt 236.0 lb

## 2021-05-29 DIAGNOSIS — M5136 Other intervertebral disc degeneration, lumbar region: Secondary | ICD-10-CM

## 2021-05-29 DIAGNOSIS — M4316 Spondylolisthesis, lumbar region: Secondary | ICD-10-CM

## 2021-05-29 DIAGNOSIS — M5116 Intervertebral disc disorders with radiculopathy, lumbar region: Secondary | ICD-10-CM

## 2021-05-29 DIAGNOSIS — M545 Low back pain, unspecified: Secondary | ICD-10-CM | POA: Diagnosis not present

## 2021-05-29 DIAGNOSIS — M6281 Muscle weakness (generalized): Secondary | ICD-10-CM

## 2021-05-29 DIAGNOSIS — M48062 Spinal stenosis, lumbar region with neurogenic claudication: Secondary | ICD-10-CM

## 2021-05-29 DIAGNOSIS — E11610 Type 2 diabetes mellitus with diabetic neuropathic arthropathy: Secondary | ICD-10-CM

## 2021-05-29 DIAGNOSIS — G8929 Other chronic pain: Secondary | ICD-10-CM

## 2021-05-29 NOTE — Patient Instructions (Signed)
Avoid bending, stooping and avoid lifting weights greater than 10 lbs. Avoid prolong standing and walking. Order for a new walker with wheels. Surgery scheduling secretary Kandice Hams, will call you in the next week to schedule for surgery.  Surgery recommended is a one level lumbar fusion L4-5 this would be done with rods, screws and cages with local bone graft and allograft (donor bone graft). Take hydrocodone for for pain. Risk of surgery includes risk of infection 1 in 100 patients, bleeding less than1/2% chance you would need a transfusion.   Risk to the nerves is one in 10,000. You will need to use a brace for 3 months and wean from the brace on the 4th month. Expect improved walking and standing tolerance. Expect relief of leg pain but numbness may persist depending on the length and degree of pressure that has been present.

## 2021-05-29 NOTE — Progress Notes (Signed)
Office Visit Note   Patient: [name]           Date of Birth: 03-29-1971           MRN: [id number] Visit Date: 05/29/2021              Requested by: [name], MD [place],  [place] [id number] PCP: [name], MD   Assessment & Plan: Visit Diagnoses:  1. Chronic low back pain, unspecified back pain laterality, unspecified whether sciatica present   2. Spondylolisthesis of lumbar region   3. Degenerative disc disease, lumbar   4. Spinal stenosis of lumbar region with neurogenic claudication     Plan: Avoid bending, stooping and avoid lifting weights greater than 10 lbs. Avoid prolong standing and walking. Order for a new walker with wheels. Surgery scheduling secretary Kandice Hams, will call you in the next week to schedule for surgery.  Surgery recommended is a one level lumbar fusion L4-5 this would be done with rods, screws and cages with local bone graft and allograft (donor bone graft). Take hydrocodone for for pain. Risk of surgery includes risk of infection 1 in 100 patients, bleeding less than1/2% chance you would need a transfusion.   Risk to the nerves is one in 10,000. You will need to use a brace for 3 months and wean from the brace on the 4th month. Expect improved walking and standing tolerance. Expect relief of leg pain but numbness may persist depending on the length and degree of pressure that has been present.  Follow-Up Instructions: No follow-ups on file.   Orders:  Orders Placed This Encounter  Procedures   XR Lumb Spine Flex&Ext Only   No orders of the defined types were placed in this encounter.     Procedures: No procedures performed   Clinical Data: No additional findings.   Subjective: Chief Complaint  Patient presents with   Lower Back - Pain    Pain on both sides, scoliosis, onset x Years, NKI, has had several falls (3) and it has made it worse since, radiates into both legs, but  right is worse, had tried injections and therapy for it with no relief.    50 year old female with back and leg pain since childhood. Was referred by [facility] for assessment as her pain is constant and radiates into the anterior thighs right side more than the left. No bowel or bladder difficulty. Pain is worsened by  bending and stooping and lifting grocery, grand baby and with standing and walking and with sweeping and mopping. Pain is improved with sitting and worse with prolong lying on the sides and pain with lying on abdomen. Sleeps with a heating pad. Tried patches, alleve and gels without relief. Some days allow some getting up but after one hour or so pain prevents her from continuing and she has to stopl She is currently on disablility, has a tracheostomy dur to asthma and complications of it. She uses a cane for the right arm, the right leg has given out on her twice and she has fallen down steps before. Has been to physical therapy, with pool exercises and has tried walking but unable to grocery shop now with inability to walk from car into the office without leaning or using a cane for ambulation. Primary care is Dr. Margarita Rana, Pulmonary MD is    Review of Systems  Constitutional:  Positive for activity change. Negative for appetite change, chills, diaphoresis, fatigue and fever.  HENT:  Positive for congestion, sinus pressure and sinus pain.   Eyes: Negative.   Respiratory:  Positive for cough, chest tightness and wheezing.   Cardiovascular:  Positive for chest pain. Negative for palpitations and leg swelling.  Gastrointestinal: Negative.   Endocrine: Negative.   Genitourinary: Negative.   Musculoskeletal:  Positive for back pain and gait problem.  Skin: Negative.  Negative for color change, pallor, rash and wound.  Allergic/Immunologic: Negative.   Neurological:  Positive for weakness and numbness.    Objective: Vital Signs: BP 132/87 (BP Location: Left Arm, Patient Position: Sitting)    Pulse 87   Ht '5\' 1"'$  (1.549 m)   Wt 236 lb (107 kg)   LMP  (LMP Unknown)   BMI 44.59 kg/m   Physical Exam Constitutional:      Appearance: She is well-developed.  HENT:     Head: Normocephalic and atraumatic.  Eyes:     Pupils: Pupils are equal, round, and reactive to light.  Pulmonary:     Effort: Pulmonary effort is normal.     Breath sounds: Normal breath sounds.  Abdominal:     General: Bowel sounds are normal.     Palpations: Abdomen is soft.  Musculoskeletal:     Cervical back: Normal range of motion and neck supple.     Lumbar back: Positive right straight leg raise test and positive left straight leg raise test.  Skin:    General: Skin is warm and dry.  Neurological:     Mental Status: She is alert and oriented to person, place, and time.  Psychiatric:        Behavior: Behavior normal.        Thought Content: Thought content normal.        Judgment: Judgment normal.    Back Exam   Tenderness  The patient is experiencing tenderness in the lumbar.  Range of Motion  Extension:  abnormal  Flexion:  abnormal  Lateral bend right:  abnormal  Lateral bend left:  abnormal  Rotation right:  abnormal  Rotation left:  abnormal   Muscle Strength  Right Quadriceps:  4/5  Left Quadriceps:  5/5  Right Hamstrings:  5/5  Left Hamstrings:  5/5   Tests  Straight leg raise right: positive Straight leg raise left: positive  Reflexes  Patellar:  2/4 Achilles:  1/4  Other  Toe walk: abnormal Heel walk: abnormal Sensation: decreased  Comments:  Bilateral foot DF weakness 4/5, Bilateral EHL weakness 4/5 right knee extension 4/5.     Specialty Comments:  No specialty comments available.  Imaging: No results found.   PMFS History: Patient Active Problem List   Diagnosis Date Noted   Abdominal pain, epigastric 11/28/2020   Nausea and vomiting 11/28/2020   COVID-19 09/16/2020   Dyspnea 08/26/2020   Rotator cuff tear 07/28/2019   Insulin dependent type 2  diabetes mellitus ([place]) 04/12/2019   Chest pain of uncertain etiology 123456   Lactic acidosis 03/22/2019   Chronic right shoulder pain    Community acquired pneumonia 10/25/2018   Asthma, chronic, unspecified asthma severity, with acute exacerbation 10/25/2018   Acute respiratory failure with hypoxia and hypercapnia ([place]) 10/23/2018   Acute pain of right shoulder due to trauma 10/05/2018   Chronic bilateral low back pain without sciatica 10/05/2018   Dysphonia 10/05/2018   Severe asthma with exacerbation 10/05/2018   Rotator cuff arthropathy, right 06/15/2018   AKI (acute kidney injury) ([place]) 03/06/2018   Carpal tunnel syndrome 01/18/2018   Heart  failure ([place]) 05/20/2017   Normocytic anemia 05/06/2017   Tracheostomy dependent ([place]) 03/26/2017   Dysphagia 03/26/2017   Status post tracheostomy ([place]) 03/14/2017   Klebsiella cystitis 03/14/2017   Morbid obesity ([place]) 03/13/2017   Subglottic stenosis 03/06/2017   Asthma 03/06/2017   Chronic diastolic heart failure ([place]) 03/06/2017   Tracheal stenosis 03/04/2017   Acute blood loss anemia    Generalized anxiety disorder    Chronic pain syndrome    GERD (gastroesophageal reflux disease) 02/22/2016   Depression 02/22/2016   Migraine 01/30/2015   Bulging lumbar disc 09/05/2013   Essential hypertension 09/05/2013   Allergic rhinitis, seasonal 08/11/2012   Chronic cough 08/11/2012   OSA (obstructive sleep apnea) 12/04/2011   Past Medical History:  Diagnosis Date   Allergy    Anxiety    Arthritis    Asthma    Chronic back pain    Chronic chest pain    resolved, no problems since 2019 per patient 07/27/19   COPD (chronic obstructive pulmonary disease) ([place])    Depression    DM (diabetes mellitus) ([place])    INSULIN DEPENDENT - TYPE 1   GERD (gastroesophageal reflux disease)    Headache(784.0)    Hyperlipidemia    no med, diet controlled   Hypertension    Hypokalemia    Respiratory disease 05/2017   uses inhalers, neb tx  prn, no oxygen   Sleep apnea    does not use CPAP due to trach   Tracheostomy in place [facility]) 02/2017    Family History  Problem Relation Age of Onset   Heart attack Mother    Stroke Mother    Diabetes Mother    Hypertension Mother    Arthritis Mother    Stroke Father    Asthma Sister    Hypertension Sister    Asthma Sister    Diabetes Sister    Asthma Brother    Seizures Brother    Asthma Brother    Diabetes Brother    Asthma Daughter    Asthma Son    Asthma Grandson    Colon cancer Neg Hx    Rectal cancer Neg Hx    Stomach cancer Neg Hx     Past Surgical History:  Procedure Laterality Date   ABDOMINAL HYSTERECTOMY  2009   APPENDECTOMY     CESAREAN SECTION     x 3   CHOLECYSTECTOMY N/A 03/02/2014   Procedure: LAPAROSCOPIC CHOLECYSTECTOMY;  Surgeon: Joyice Faster. Cornett, MD;  Location: [facility];  Service: General;  Laterality: N/A;   HERNIA REPAIR     PANENDOSCOPY N/A 03/04/2017   Procedure: PANENDOSCOPY WITH POSSIBLE FOREIGN BODY REMOVAL;  Surgeon: Jodi Marble, MD;  Location: [facility];  Service: ENT;  Laterality: N/A;   ROTATOR CUFF REPAIR Left    ROTATOR CUFF REPAIR Right 07/27/2019   SHOULDER ARTHROSCOPY WITH SUBACROMIAL DECOMPRESSION, ROTATOR CUFF REPAIR AND BICEP TENDON REPAIR Right 07/28/2019   Procedure: RIGHT SHOULDER ARTHROSCOP, MINI OPEN ROTATOR CUFF TEAR REPAIR,  BICEPS TENODESIS, DISTAL CLAVICLE EXCISION;  Surgeon: Meredith Pel, MD;  Location: [facility];  Service: Orthopedics;  Laterality: Right;   TOOTH EXTRACTION N/A 02/22/2021   Procedure: DENTAL RESTORATION/EXTRACTIONS;  Surgeon: Diona Browner, DMD;  Location: [facility];  Service: Oral Surgery;  Laterality: N/A;   TRACHEOSTOMY  02/2017   TUBAL LIGATION     VESICOVAGINAL FISTULA CLOSURE W/ TAH  2009   Social History   Occupational History   Not on file  Tobacco Use   Smoking status:  Former    Packs/day: 0.50    Years: 25.00    Pack years: 12.50    Types: Cigarettes    Quit date: 01/21/2016    Years since  quitting: 5.3   Smokeless tobacco: Never  Vaping Use   Vaping Use: Never used  Substance and Sexual Activity   Alcohol use: Yes    Alcohol/week: 0.0 standard drinks    Comment: social wine   Drug use: Yes    Types: Marijuana    Comment: last used 1 weeks ago   Sexual activity: Yes    Partners: Male    Birth control/protection: Surgical    Comment: Hysterectomy

## 2021-05-30 ENCOUNTER — Encounter (INDEPENDENT_AMBULATORY_CARE_PROVIDER_SITE_OTHER): Payer: 59 | Admitting: Clinical

## 2021-05-30 ENCOUNTER — Telehealth (INDEPENDENT_AMBULATORY_CARE_PROVIDER_SITE_OTHER): Payer: Self-pay | Admitting: Clinical

## 2021-05-30 LAB — EXTRA SPECIMEN

## 2021-05-30 LAB — HEMOGLOBIN A1C
Hgb A1c MFr Bld: 8.3 % of total Hgb — ABNORMAL HIGH (ref <5.7)
Mean Plasma Glucose: 192 mg/dL
eAG (mmol/L): 10.6 mmol/L

## 2021-05-30 NOTE — Telephone Encounter (Signed)
LCSWA placed two calls to pt's mobile phone and one call to pr's home file with no answer, left vm. Pt never joined video caregility visit.

## 2021-06-05 ENCOUNTER — Ambulatory Visit (HOSPITAL_BASED_OUTPATIENT_CLINIC_OR_DEPARTMENT_OTHER): Payer: Medicaid Other | Admitting: Clinical

## 2021-06-05 DIAGNOSIS — R45851 Suicidal ideations: Secondary | ICD-10-CM | POA: Diagnosis not present

## 2021-06-05 DIAGNOSIS — F411 Generalized anxiety disorder: Secondary | ICD-10-CM | POA: Diagnosis not present

## 2021-06-05 DIAGNOSIS — F331 Major depressive disorder, recurrent, moderate: Secondary | ICD-10-CM

## 2021-06-06 ENCOUNTER — Ambulatory Visit (HOSPITAL_BASED_OUTPATIENT_CLINIC_OR_DEPARTMENT_OTHER): Payer: 59

## 2021-06-06 ENCOUNTER — Telehealth: Payer: Self-pay | Admitting: Internal Medicine

## 2021-06-06 DIAGNOSIS — Z0000 Encounter for general adult medical examination without abnormal findings: Secondary | ICD-10-CM | POA: Diagnosis not present

## 2021-06-06 NOTE — Patient Instructions (Signed)
Health Maintenance, Female Adopting a healthy lifestyle and getting preventive care are important in promoting health and wellness. Ask your health care provider about: The right schedule for you to have regular tests and exams. Things you can do on your own to prevent diseases and keep yourself healthy. What should I know about diet, weight, and exercise? Eat a healthy diet  Eat a diet that includes plenty of vegetables, fruits, low-fat dairy products, and lean protein. Do not eat a lot of foods that are high in solid fats, added sugars, or sodium. Maintain a healthy weight Body mass index (BMI) is used to identify weight problems. It estimates body fat based on height and weight. Your health care provider can help determine your BMI and help you achieve or maintain a healthy weight. Get regular exercise Get regular exercise. This is one of the most important things you can do for your health. Most adults should: Exercise for at least 150 minutes each week. The exercise should increase your heart rate and make you sweat (moderate-intensity exercise). Do strengthening exercises at least twice a week. This is in addition to the moderate-intensity exercise. Spend less time sitting. Even light physical activity can be beneficial. Watch cholesterol and blood lipids Have your blood tested for lipids and cholesterol at 52 years of age, then have this test every 5 years. Have your cholesterol levels checked more often if: Your lipid or cholesterol levels are high. You are older than 52 years of age. You are at high risk for heart disease. What should I know about cancer screening? Depending on your health history and family history, you may need to have cancer screening at various ages. This may include screening for: Breast cancer. Cervical cancer. Colorectal cancer. Skin cancer. Lung cancer. What should I know about heart disease, diabetes, and high blood pressure? Blood pressure and heart  disease High blood pressure causes heart disease and increases the risk of stroke. This is more likely to develop in people who have high blood pressure readings, are of African descent, or are overweight. Have your blood pressure checked: Every 3-5 years if you are 18-39 years of age. Every year if you are 40 years old or older. Diabetes Have regular diabetes screenings. This checks your fasting blood sugar level. Have the screening done: Once every three years after age 40 if you are at a normal weight and have a low risk for diabetes. More often and at a younger age if you are overweight or have a high risk for diabetes. What should I know about preventing infection? Hepatitis B If you have a higher risk for hepatitis B, you should be screened for this virus. Talk with your health care provider to find out if you are at risk for hepatitis B infection. Hepatitis C Testing is recommended for: Everyone born from 1945 through 1965. Anyone with known risk factors for hepatitis C. Sexually transmitted infections (STIs) Get screened for STIs, including gonorrhea and chlamydia, if: You are sexually active and are younger than 52 years of age. You are older than 52 years of age and your health care provider tells you that you are at risk for this type of infection. Your sexual activity has changed since you were last screened, and you are at increased risk for chlamydia or gonorrhea. Ask your health care provider if you are at risk. Ask your health care provider about whether you are at high risk for HIV. Your health care provider may recommend a prescription medicine   to help prevent HIV infection. If you choose to take medicine to prevent HIV, you should first get tested for HIV. You should then be tested every 3 months for as long as you are taking the medicine. Pregnancy If you are about to stop having your period (premenopausal) and you may become pregnant, seek counseling before you get  pregnant. Take 400 to 800 micrograms (mcg) of folic acid every day if you become pregnant. Ask for birth control (contraception) if you want to prevent pregnancy. Osteoporosis and menopause Osteoporosis is a disease in which the bones lose minerals and strength with aging. This can result in bone fractures. If you are 65 years old or older, or if you are at risk for osteoporosis and fractures, ask your health care provider if you should: Be screened for bone loss. Take a calcium or vitamin D supplement to lower your risk of fractures. Be given hormone replacement therapy (HRT) to treat symptoms of menopause. Follow these instructions at home: Lifestyle Do not use any products that contain nicotine or tobacco, such as cigarettes, e-cigarettes, and chewing tobacco. If you need help quitting, ask your health care provider. Do not use street drugs. Do not share needles. Ask your health care provider for help if you need support or information about quitting drugs. Alcohol use Do not drink alcohol if: Your health care provider tells you not to drink. You are pregnant, may be pregnant, or are planning to become pregnant. If you drink alcohol: Limit how much you use to 0-1 drink a day. Limit intake if you are breastfeeding. Be aware of how much alcohol is in your drink. In the [place] one drink equals one 12 oz bottle of beer (355 mL), one 5 oz glass of wine (148 mL), or one 1 oz glass of hard liquor (44 mL). General instructions Schedule regular health, dental, and eye exams. Stay current with your vaccines. Tell your health care provider if: You often feel depressed. You have ever been abused or do not feel safe at home. Summary Adopting a healthy lifestyle and getting preventive care are important in promoting health and wellness. Follow your health care provider's instructions about healthy diet, exercising, and getting tested or screened for diseases. Follow your health care provider's  instructions on monitoring your cholesterol and blood pressure. This information is not intended to replace advice given to you by your health care provider. Make sure you discuss any questions you have with your health care provider. Document Revised: 11/16/2020 Document Reviewed: 09/01/2018 Elsevier Patient Education  2022 Elsevier Inc.  

## 2021-06-06 NOTE — Telephone Encounter (Signed)
Fax received from Dr. [name] to perform a Lumbar fusion at L4-L5 with rods, cages, screws and local bone graft on patient.  Patient needs surgery clearance. Patient was seen on 03/06/2021. Office protocol is a risk assessment can be sent to surgeon if patient has been seen in 60 days or less.   Sending to Dr. Shearon Stalls for risk assessment or recommendations if patient needs to be seen in office prior to surgical procedure.

## 2021-06-06 NOTE — Progress Notes (Signed)
Subjective:   [name] is a 50 y.o. female who presents for Medicare Annual (Subsequent) preventive examination. I connected with  [name] on 06/06/21 by a audio enabled telemedicine application and verified that I am speaking with the correct person using two identifiers.   I discussed the limitations of evaluation and management by telemedicine. The patient expressed understanding and agreed to proceed.   Location of patient: Home Location of provider: Office  Persons participating in visit: [name] (patient) and Drenda Freeze, [place]  Review of Systems    Defer to PCP       Objective:    Today's Vitals   06/06/21 1510  PainSc: 8    There is no height or weight on file to calculate BMI.  Advanced Directives 06/06/2021 04/01/2021 02/20/2021 09/25/2020 09/16/2020 08/27/2020 08/10/2020  Does Patient Have a Medical Advance Directive? _0  No No  Would patient like information on creating a medical advance directive? Yes (ED - Information included in AVS) - No - Patient declined No - Patient declined No - Patient declined No - Patient declined No - Patient declined  Pre-existing out of facility DNR order (yellow form or pink MOST form) - - - - - - -    Current Medications (verified) Outpatient Encounter Medications as of 06/06/2021  Medication Sig   albuterol (PROAIR HFA) 108 (90 Base) MCG/ACT inhaler Inhale 2 puffs into the lungs every 6 (six) hours as needed for wheezing or shortness of breath.   albuterol (PROVENTIL) (2.5 MG/3ML) 0.083% nebulizer solution Take 3 mLs (2.5 mg total) by nebulization every 6 (six) hours as needed for wheezing or shortness of breath.   atorvastatin (LIPITOR) 20 MG tablet Take 1 tablet (20 mg total) by mouth daily.   Blood Glucose Monitoring Suppl (ONETOUCH VERIO) w/Device KIT 1 each by Does not apply route daily. Use to monitor glucose levels daily; E11.9   buPROPion (WELLBUTRIN SR) 150 MG 12  hr tablet Take 1 tablet (150 mg total) by mouth 2 (two) times daily.   cetirizine (ZYRTEC) 10 MG tablet Take 1 tablet (10 mg total) by mouth at bedtime.   dapagliflozin propanediol (FARXIGA) 5 MG TABS tablet Take 1 tablet (5 mg total) by mouth daily before breakfast.   diclofenac Sodium (VOLTAREN) 1 % GEL Apply 4 g topically 4 (four) times daily.   furosemide (LASIX) 20 MG tablet Take 1 tablet (20 mg total) by mouth daily.   gabapentin (NEURONTIN) 300 MG capsule Take 2 capsules (600 mg total) by mouth 2 (two) times daily.   glucose blood (ONETOUCH VERIO) test strip Use to monitor glucose levels daily; E11.9   hydrOXYzine (ATARAX/VISTARIL) 25 MG tablet Take 1 tablet (25 mg total) by mouth 3 (three) times daily as needed for anxiety.   insulin degludec (TRESIBA FLEXTOUCH) 200 UNIT/ML FlexTouch Pen Inject 160 Units into the skin daily.   Insulin Pen Needle 31G X 8 MM MISC Inject 1 Units as directed daily.   Melatonin 3 MG TABS Take 3 mg by mouth at bedtime as needed (for sleep).    methocarbamol (ROBAXIN) 750 MG tablet Take 750 mg by mouth 3 (three) times daily.   Misc. Devices MISC Nebulizer device. Diagnosis - Asthma   Misc. Devices [employer].  Diagnosis-low back pain   Misc. Devices MISC Shower chair   mometasone-formoterol (DULERA) 200-5 MCG/ACT AERO Inhale 2 puffs into the lungs in the morning and at bedtime.   ondansetron (ZOFRAN) 4 MG tablet Take  1 tablet (4 mg total) by mouth every 8 (eight) hours as needed for nausea or vomiting.   OneTouch Delica Lancets 24M MISC 1 each by Does not apply route daily. Use to monitor glucose levels daily; E11.9   pantoprazole (PROTONIX) 40 MG tablet Take 1 tablet (40 mg total) by mouth 2 (two) times daily.   Semaglutide,0.25 or 0.5MG/DOS, (OZEMPIC, 0.25 OR 0.5 MG/DOSE,) 2 MG/1.5ML SOPN Inject 0.5 mg into the skin once a week.   sertraline (ZOLOFT) 100 MG tablet Take 1 tablet (100 mg total) by mouth at bedtime.   silver sulfADIAZINE (SILVADENE) 1 % cream  Apply 1 application topically daily.   sucralfate (CARAFATE) 1 g tablet Take 1 tablet (1 g total) by mouth 4 (four) times daily -  with meals and at bedtime.   SUMAtriptan (IMITREX) 100 MG tablet At the onset of a migraine; may repeat in 2 hours. Max daily dose 272m (Patient taking differently: Take 100 mg by mouth every 2 (two) hours as needed for migraine.)   tiZANidine (ZANAFLEX) 4 MG tablet Take 1 tablet (4 mg total) by mouth every 8 (eight) hours as needed for muscle spasms.   topiramate (TOPAMAX) 100 MG tablet Take 1.5 tablets (150 mg total) by mouth 2 (two) times daily.   [DISCONTINUED] fluticasone-salmeterol (ADVAIR HFA) 230-21 MCG/ACT inhaler Inhale 2 puffs into the lungs 2 (two) times daily.   No facility-administered encounter medications on file as of 06/06/2021.    Allergies (verified) Reglan [metoclopramide]   History: Past Medical History:  Diagnosis Date   Allergy    Anxiety    Arthritis    Asthma    Chronic back pain    Chronic chest pain    resolved, no problems since 2019 per patient 07/27/19   [place]    Depression    [place]    INSULIN DEPENDENT - TYPE 1   GERD (gastroesophageal reflux disease)    Headache(784.0)    Hyperlipidemia    no med, diet controlled   Hypertension    Hypokalemia    Respiratory disease 05/2017   uses inhalers, neb tx prn, no oxygen   Sleep apnea    does not use CPAP due to trach   Trache[facility] in place ([facility] 02/2017   Past Surgical History:  Procedure Laterality Date   ABDOMINAL HYSTERECTOMY  2009   APPENDECTOMY     CESAREAN SECTION     x 3   CHOLECYSTECTOMY N/A 03/02/2014   Procedure: LAPAROSCOPIC CHOLECYSTECTOMY;  Surgeon: TJoyice Faster Cornett, MD;  [place] [facility]  Service: General;  Laterality: N/A;   HERNIA REPAIR     PANENDOSCOPY N/A 03/04/2017   Procedure: PANENDOSCOPY WITH POSSIBLE FOREIGN BODY REMOVAL;  Surgeon: WJodi Marble MD;  [place] [facility]   Service: ENT;  Laterality: N/A;   ROTATOR CUFF REPAIR Left    ROTATOR CUFF REPAIR Right 07/27/2019   SHOULDER ARTHROSCOPY WITH SUBACROMIAL DECOMPRESSION, ROTATOR CUFF REPAIR AND BICEP TENDON REPAIR Right 07/28/2019   Procedure: RIGHT SHOULDER ARTHROSCOP, MINI OPEN ROTATOR CUFF TEAR REPAIR,  BICEPS TENODESIS, DISTAL CLAVICLE EXCISION;  Surgeon: DMeredith Pel MD;  [place] [facility]  Service: Orthopedics;  Laterality: Right;   TOOTH EXTRACTION N/A 02/22/2021   Procedure: DENTAL RESTORATION/EXTRACTIONS;  Surgeon: JDiona Browner DMD;  [facility];  Service: Oral Surgery;  Laterality: N/A;   TRACHEOSTOMY  02/2017   TUBAL LIGATION     VESICOVAGINAL FISTULA CLOSURE W/ TAH  2009   Family History  Problem Relation Age of Onset   Heart attack Mother    Stroke Mother    Diabetes Mother    Hypertension Mother    Arthritis Mother    Stroke Father    Asthma Sister    Hypertension Sister    Asthma Sister    Diabetes Sister    Asthma Brother    Seizures Brother    Asthma Brother    Diabetes Brother    Asthma Daughter    Asthma Son    Asthma Grandson    Colon cancer Neg Hx    Rectal cancer Neg Hx    Stomach cancer Neg Hx    Social History   Socioeconomic History   Marital status: Married    Spouse name: Journalist, newspaper   Number of children: Not on file   Years of education: Not on file   Highest education level: Not on file  Occupational History   Not on file  Tobacco Use   Smoking status: Former    Packs/day: 0.50    Years: 25.00    Pack years: 12.50    Types: Cigarettes    Quit date: 01/21/2016    Years since quitting: 5.3   Smokeless tobacco: Never  Vaping Use   Vaping Use: Never used  Substance and Sexual Activity   Alcohol use: Yes    Alcohol/week: 0.0 standard drinks    Comment: social wine   Drug use: Yes    Types: Marijuana    Comment: last used 1 weeks ago   Sexual activity: Yes    Partners: Male    Birth control/protection: Surgical    Comment: Hysterectomy   Other Topics Concern   Not on file  Social History Narrative   Lives with husband   Social Determinants of Health   Financial Resource Strain: Medium Risk   Difficulty of Paying Living Expenses: Somewhat hard  Food Insecurity: Food Insecurity Present   Worried About Charity fundraiser in the Last Year: Sometimes true   Ran Out of Food in the Last Year: Sometimes true  Transportation Needs: No Transportation Needs   Lack of Transportation (Medical): No   Lack of Transportation (Non-Medical): No  Physical Activity: Inactive   Days of Exercise per Week: 0 days   Minutes of Exercise per Session: 0 min  Stress: Stress Concern Present   Feeling of Stress : Very much  Social Connections: Moderately Isolated   Frequency of Communication with Friends and Family: More than three times a week   Frequency of Social Gatherings with Friends and Family: More than three times a week   Attends Religious Services: Never   Marine scientist or Organizations: No   Attends Music therapist: Never   Marital Status: Married    Tobacco Counseling Counseling given: Not Answered   Clinical Intake:  Pre-visit preparation completed: Yes  Pain : 0-10 Pain Score: 8  Pain Location: Face Pain Onset: More than a month ago Pain Frequency: Constant     Diabetes: Yes CBG done?: No Did pt. bring in CBG monitor from home?: No  How often do you need to have someone help you when you read instructions, pamphlets, or other written materials from your doctor or pharmacy?: 4 - Often What is the last grade level you completed in school?: 11th grade  Diabetic?Yes  Interpreter Needed?: No  Information entered by :: Drenda Freeze, CMA   Activities of Daily Living In your present state of health, do you have  any difficulty performing the following activities: 06/06/2021 02/22/2021  Hearing? N -  Vision? Y -  Difficulty concentrating or making decisions? Y -  Walking or climbing  stairs? Y -  Comment - -  Dressing or bathing? Y -  Doing errands, shopping? Y N  Preparing Food and eating ? Y -  Using the Toilet? N -  In the past six months, have you accidently leaked urine? Y -  Do you have problems with loss of bowel control? N -  Managing your Medications? N -  Managing your Finances? N -  Housekeeping or managing your Housekeeping? Y -  Some recent data might be hidden    Patient Care Team: [name], MD as PCP - General (Family Medicine)  Indicate any recent Medical Services you may have received from other than [facility] providers in the past year (date may be approximate).     Assessment:   This is a routine wellness examination for [name].  Hearing/Vision screen No results found.  Dietary issues and exercise activities discussed:     Goals Addressed   None    Depression Screen PHQ 2/9 Scores 06/05/2021 09/25/2020 07/11/2020 04/10/2020 11/22/2018 10/05/2018 07/26/2018  PHQ - 2 Score 6 0 _0 PHQ- 9 Score 24 - _1 Fall Risk Fall Risk  06/06/2021 07/11/2020 04/10/2020 10/04/2019 03/31/2019  Falls in the past year? 1 0 0 0 0  Number falls in past yr: 1 - - - 0  Injury with Fall? 1 - - - -  Risk for fall due to : - - No Fall Risks - -  Follow up Falls evaluation completed - - - -    FALL RISK PREVENTION PERTAINING TO THE HOME:  Any stairs in or around the home? Yes  If so, are there any without handrails? No  Home free of loose throw rugs in walkways, pet beds, electrical cords, etc? Yes  Adequate lighting in your home to reduce risk of falls? Yes   ASSISTIVE DEVICES UTILIZED TO PREVENT FALLS:  Life alert? No  Use of a cane, walker or w/c? Yes  Grab bars in the bathroom? No  Shower chair or bench in shower? Yes  Elevated toilet seat or a handicapped toilet? No   TIMED UP AND GO:  Was the test performed?  N/A .  Length of time to ambulate 10 feet: N/A sec.     Cognitive Function:     6CIT Screen 06/06/2021  What  Year? 0 points  What month? 0 points  What time? 0 points  Count back from 20 0 points  Months in reverse 0 points  Repeat phrase 0 points  Total Score 0    Immunizations Immunization History  Administered Date(s) Administered   Influenza,inj,Quad PF,6+ Mos 06/05/2014, 08/01/2015, 07/02/2016, 06/19/2017   PFIZER(Purple Top)SARS-COV-2 Vaccination 08/29/2020   Pneumococcal Polysaccharide-23 04/29/2013, 02/21/2017    TDAP status: Due, Education has been provided regarding the importance of this vaccine. Advised may receive this vaccine at local pharmacy or Health Dept. Aware to provide a copy of the vaccination record if obtained from local pharmacy or Health Dept. Verbalized acceptance and understanding.  Flu Vaccine status: Due, Education has been provided regarding the importance of this vaccine. Advised may receive this vaccine at local pharmacy or Health Dept. Aware to provide a copy of the vaccination record if obtained from local pharmacy or Health Dept. Verbalized acceptance and understanding.  Pneumococcal vaccine status:  Due, Education has been provided regarding the importance of this vaccine. Advised may receive this vaccine at local pharmacy or Health Dept. Aware to provide a copy of the vaccination record if obtained from local pharmacy or Health Dept. Verbalized acceptance and understanding.  Covid-19 vaccine status: Information provided on how to obtain vaccines.   Qualifies for Shingles Vaccine? Yes   Zostavax completed No   Shingrix Completed?: No.    Education has been provided regarding the importance of this vaccine. Patient has been advised to call insurance company to determine out of pocket expense if they have not yet received this vaccine. Advised may also receive vaccine at local pharmacy or Health Dept. Verbalized acceptance and understanding.  Screening Tests Health Maintenance  Topic Date Due   Hepatitis C Screening  Never done   TETANUS/TDAP  Never done    Zoster Vaccines- Shingrix (1 of 2) Never done   OPHTHALMOLOGY EXAM  11/06/2016   Pneumococcal Vaccine 39-36 Years old (3 - PCV) 02/21/2018   MAMMOGRAM  04/13/2020   COVID-19 Vaccine (2 - Pfizer risk series) 09/19/2020   PAP SMEAR-Modifier  02/19/2021   INFLUENZA VACCINE  04/22/2021   COLONOSCOPY (Pts 45-34yr Insurance coverage will need to be confirmed)  06/12/2021   HEMOGLOBIN A1C  11/26/2021   URINE MICROALBUMIN  11/28/2021   FOOT EXAM  04/10/2022   PNEUMOCOCCAL POLYSACCHARIDE VACCINE AGE 86-64 HIGH RISK  Completed   HIV Screening  Completed   HPV VACCINES  Aged Out    Health Maintenance  Health Maintenance Due  Topic Date Due   Hepatitis C Screening  Never done   TETANUS/TDAP  Never done   Zoster Vaccines- Shingrix (1 of 2) Never done   OPHTHALMOLOGY EXAM  11/06/2016   Pneumococcal Vaccine 056669Years old (3 - PCV) 02/21/2018   MAMMOGRAM  04/13/2020   COVID-19 Vaccine (2 - Pfizer risk series) 09/19/2020   PAP SMEAR-Modifier  02/19/2021   INFLUENZA VACCINE  04/22/2021    Colorectal cancer screening: Type of screening: Colonoscopy. Completed 06/12/20. Repeat every 1 years  Mammogram status: Ordered 07/11/20. Pt provided with contact info and advised to call to schedule appt.   Lung Cancer Screening: (Low Dose CT Chest recommended if Age 52-80years, 30 pack-year currently smoking OR have quit w/in 15years.) does not qualify.   Lung Cancer Screening Referral: No  Additional Screening:  Hepatitis C Screening: does qualify; Completed Never done.  Vision Screening: Recommended annual ophthalmology exams for early detection of glaucoma and other disorders of the eye. Is the patient up to date with their annual eye exam?  No  Who is the provider or what is the name of the office in which the patient attends annual eye exams? N/A If pt is not established with a provider, would they like to be referred to a provider to establish care? No .   Dental Screening: Recommended annual  dental exams for proper oral hygiene  Community Resource Referral / Chronic Care Management: CRR required this visit?  No   CCM required this visit?  No      Plan:     I have personally reviewed and noted the following in the patient's chart:   Medical and social history Use of alcohol, tobacco or illicit drugs  Current medications and supplements including opioid prescriptions.  Functional ability and status Nutritional status Physical activity Advanced directives List of other physicians Hospitalizations, surgeries, and ER visits in previous 12 months Vitals Screenings to include cognitive, depression, and falls Referrals  and appointments  In addition, I have reviewed and discussed with patient certain preventive protocols, quality metrics, and best practice recommendations. A written personalized care plan for preventive services as well as general preventive health recommendations were provided to patient.     Drenda Freeze, [facility]   06/06/2021   Nurse Notes: Non-Face to Face 60 minute visit Encounter.  [name] , Thank you for taking time to come for your Medicare Wellness Visit. I appreciate your ongoing commitment to your health goals. Please review the following plan we discussed and let me know if I can assist you in the future.   These are the goals we discussed:  Goals   None     This is a list of the screening recommended for you and due dates:  Health Maintenance  Topic Date Due   Hepatitis C Screening: USPSTF Recommendation to screen - Ages 51-79 yo.  Never done   Tetanus Vaccine  Never done   Zoster (Shingles) Vaccine (1 of 2) Never done   Eye exam for diabetics  11/06/2016   Pneumococcal Vaccination (3 - PCV) 02/21/2018   Mammogram  04/13/2020   COVID-19 Vaccine (2 - Pfizer risk series) 09/19/2020   Pap Smear  02/19/2021   Flu Shot  04/22/2021   Colon Cancer Screening  06/12/2021   Hemoglobin A1C  11/26/2021   Urine Protein Check   11/28/2021   Complete foot exam   04/10/2022   Pneumococcal vaccine  Completed   HIV Screening  Completed   HPV Vaccine  Aged Out

## 2021-06-07 NOTE — BH Specialist Note (Signed)
Integrated Behavioral Health Initial In-Person Visit  MRN: [id number] Name: [name]  Number of [place] Clinician visits:: 1/6 Session Start time: 4:40pm  Session End time: 5:40pm Total time: 60 minutes  Types of Service: Individual psychotherapy  Interpretor:No. Interpretor Name and Language: N/A   Warm Hand Off Completed.        Subjective: [name] is a 50 y.o. female accompanied by  self Patient was referred by PCP [place] for depression and anxiety. Patient reports the following symptoms/concerns: Reports feeling depressed, decreased interest in activities, difficulty sleeping, racing thoughts, difficulty concentrating, decreased energy, self-esteem disturbances, anxiousness, irritability, excessive worrying, and difficulty relaxing. Reports irritability with physical health. Reports that she was in a coma about four years ago and has to wear a trach. Reports that she doesn't feel like herself and often wants to be alone. Reports that she is frustrated with the health care system due to not feeling properly cared for. Reports having a fear of hospitals due to not trusting doctors and fearing that something bad will happen. Duration of problem: 4 years; Severity of problem: moderate  Objective: Mood: Anxious and Depressed and Affect: Appropriate and Tearful Risk of harm to self or others: Suicidal ideation Suicide plan : Remove trach and suffocate herself  No intent  Life Context: Family and Social: Reports having three children, stepchildren, grandchildren, and spouse as a support system.  School/Work: Pt is unemployed and receives disability.  Self-Care: Reports spending time with family as coping skill. Pt currently has medication to assist with mood.  Life Changes: Reports that four years ago she was in a coma and had to get a trach after being in coma. Pt reports that she was never explained what happened with her  throat it didn't feel properly cared for.   Patient and/or Family's Strengths/Protective Factors: Concrete supports in place (healthy food, safe environments, etc.) and Sense of purpose  Goals Addressed: Patient will: Reduce symptoms of: anxiety and depression Increase knowledge and/or ability of: coping skills  Demonstrate ability to: Increase healthy adjustment to current life circumstances  Progress towards Goals: Ongoing  Interventions: Interventions utilized: Mindfulness or Psychologist, educational, CBT Cognitive Behavioral Therapy, and Supportive Counseling  Standardized Assessments completed:  MDQ (Mood Disorder Questionnaire), GAD-7, and PHQ 9 [place] from 06/05/2021 in [place]  PHQ-9 Total Score 24      . GAD 7 : Generalized Anxiety Score 06/05/2021 04/10/2020 11/22/2018 10/05/2018  Nervous, Anxious, on Edge '2 3 2 3  '$ Control/stop worrying '3 2 3 3  '$ Worry too much - different things '3 3 3 3  '$ Trouble relaxing '3 2 2 3  '$ Restless '2 1 1 2  '$ Easily annoyed or irritable '3 3 2 3  '$ Afraid - awful might happen '3 2 2 2  '$ Total GAD 7 Score '19 16 15 19      '$ Patient and/or Family Response: Pt receptive to tx. Pt receptive to psychoeducation provided on anxiety and depression. Pt receptive to cognitive restructuring utilized to decrease pt worries and identify strengths. Pt will decrease time alone by spending time with family when the come over. Pt will utilize deep breathing  Patient Centered Plan: Patient is on the following Treatment Plan(s):  Depression and Anxiety  Assessment: Endorses passive SI. Reports plan as removing trach and suffocating herself. Denies intent. Reports protective factors as family. Verbal safety plan completed and pt provided with crisis resources and pt agreed to utilize these resources  if SI arises with plan, means, and intent. Denies HI. Denies auditory/visual hallucinations. Patient currently  experiencing depression and anxiety related to physical health and its trauma association due to not feeling properly cared for. Pt appears to have difficulty with accepting physical health complications. Pt appears to be irritable and has difficulty with going to hospitals due to fearing that something bad will happen. Pt frequently isolates herself in her room. Pt has adequate support system.    Patient may benefit from psychoeducation and CBT. LCSWA provided affirmation for pt attending session. LCSWA provided psychoeducation on anxiety and depression and its association with physical health. Pt utilized cognitive restructuring and assisted pt in identifying her strengths. LCSWA encouraged pt to utilize deep breathing exercises and spend time with family when they come over. LCSWA encouraged pt to adhere to medication. LCSWA will fu with pt.   Plan: Follow up with behavioral health clinician on : 07/01/21 Behavioral recommendations: Utilize deep breathing exercises, adhere to medication, and increase time spent with family in order to decrease isolation. Utilize provided crisis resources if SI arises with plan, means, and intent.  Referral(s): [place] (In Clinic) "From scale of 1-10, how likely are you to follow plan?": 10  Ronin Crager C [place], LCSW

## 2021-06-12 ENCOUNTER — Encounter: Payer: Self-pay | Admitting: Endocrinology

## 2021-06-12 NOTE — Telephone Encounter (Signed)
Called and spoke with patient to get her scheduled for appointment since she is out of the 60 day window. She is now scheduled with Tammy Parrett.   Next Appt With Pulmonology (Tammy Parrett, NP)06/20/2021 at  4:30 PM

## 2021-06-13 ENCOUNTER — Encounter: Payer: Self-pay | Admitting: Endocrinology

## 2021-06-13 ENCOUNTER — Ambulatory Visit: Payer: 59 | Admitting: Specialist

## 2021-06-18 ENCOUNTER — Other Ambulatory Visit: Payer: Self-pay

## 2021-06-18 ENCOUNTER — Encounter: Payer: Self-pay | Admitting: Internal Medicine

## 2021-06-18 ENCOUNTER — Ambulatory Visit: Payer: 59 | Attending: Family Medicine | Admitting: Family Medicine

## 2021-06-18 ENCOUNTER — Encounter: Payer: Self-pay | Admitting: Family Medicine

## 2021-06-18 VITALS — BP 126/79 | HR 104 | Ht 61.0 in | Wt 246.0 lb

## 2021-06-18 DIAGNOSIS — E119 Type 2 diabetes mellitus without complications: Secondary | ICD-10-CM

## 2021-06-18 DIAGNOSIS — M5126 Other intervertebral disc displacement, lumbar region: Secondary | ICD-10-CM

## 2021-06-18 DIAGNOSIS — M5136 Other intervertebral disc degeneration, lumbar region: Secondary | ICD-10-CM

## 2021-06-18 DIAGNOSIS — Z01818 Encounter for other preprocedural examination: Secondary | ICD-10-CM

## 2021-06-18 DIAGNOSIS — Z794 Long term (current) use of insulin: Secondary | ICD-10-CM | POA: Diagnosis not present

## 2021-06-18 LAB — POCT GLYCOSYLATED HEMOGLOBIN (HGB A1C): HbA1c, POC (controlled diabetic range): 7.8 % — AB (ref 0.0–7.0)

## 2021-06-18 NOTE — Progress Notes (Signed)
Subjective:  Patient ID: [name], female    DOB: Sep 22, 1971  Age: 50 y.o. MRN: [id number]  CC: Pre-op Exam   [name] is a 50 y.o. year old female with a history of type 1 diabetes mellitus (A1c 7.8), hypertension, obstructive sleep apnea, asthma, depression, s/p tracheostomy secondary to intubation related tracheal injury, subglottic stenosis (status post balloon dilatation and Kenalog injection of stenosis tracheostomy tube exchange by ENT, status post right rotator cuff surgery in 07/2019. Uses 2L oxygen at night    Interval History: Today she presents for preop evaluation for lumbar spine surgery by Dr.Nitka. After surgery she will have her husband, children and grandchildren around to assist and she also has a home [employer], applications. She has dyspnea on mild exertion. Denies chest pain at rest but chest pain sometimes occurs when pulling heavy objects.  She has no cardiac history and is not seen by cardiology. EKG from 03/2021 was unremarkable. She sees Pulmonary as well and clearance will be ontained from them Denies additional concerns today. Past Medical History:  Diagnosis Date   Allergy    Anxiety    Arthritis    Asthma    Chronic back pain    Chronic chest pain    resolved, no problems since 2019 per patient 07/27/19   COPD (chronic obstructive pulmonary disease) ([place])    Depression    DM (diabetes mellitus) ([place])    INSULIN DEPENDENT - TYPE 1   GERD (gastroesophageal reflux disease)    Headache(784.0)    Hyperlipidemia    no med, diet controlled   Hypertension    Hypokalemia    Respiratory disease 05/2017   uses inhalers, neb tx prn, no oxygen   Sleep apnea    does not use CPAP due to trach   Tracheostomy in place [facility]) 02/2017    Past Surgical History:  Procedure Laterality Date   ABDOMINAL HYSTERECTOMY  2009   APPENDECTOMY     CESAREAN SECTION     x 3   CHOLECYSTECTOMY N/A 03/02/2014   Procedure: LAPAROSCOPIC  CHOLECYSTECTOMY;  Surgeon: Joyice Faster. Cornett, MD;  Location: [place];  Service: General;  Laterality: N/A;   HERNIA REPAIR     PANENDOSCOPY N/A 03/04/2017   Procedure: PANENDOSCOPY WITH POSSIBLE FOREIGN BODY REMOVAL;  Surgeon: Jodi Marble, MD;  Location: [facility];  Service: ENT;  Laterality: N/A;   ROTATOR CUFF REPAIR Left    ROTATOR CUFF REPAIR Right 07/27/2019   SHOULDER ARTHROSCOPY WITH SUBACROMIAL DECOMPRESSION, ROTATOR CUFF REPAIR AND BICEP TENDON REPAIR Right 07/28/2019   Procedure: RIGHT SHOULDER ARTHROSCOP, MINI OPEN ROTATOR CUFF TEAR REPAIR,  BICEPS TENODESIS, DISTAL CLAVICLE EXCISION;  Surgeon: Meredith Pel, MD;  Location: [place];  Service: Orthopedics;  Laterality: Right;   TOOTH EXTRACTION N/A 02/22/2021   Procedure: DENTAL RESTORATION/EXTRACTIONS;  Surgeon: Diona Browner, DMD;  Location: [facility];  Service: Oral Surgery;  Laterality: N/A;   TRACHEOSTOMY  02/2017   TUBAL LIGATION     VESICOVAGINAL FISTULA CLOSURE W/ TAH  2009    Family History  Problem Relation Age of Onset   Heart attack Mother    Stroke Mother    Diabetes Mother    Hypertension Mother    Arthritis Mother    Stroke Father    Asthma Sister    Hypertension Sister    Asthma Sister    Diabetes Sister    Asthma Brother    Seizures Brother    Asthma Brother    Diabetes  Brother    Asthma Daughter    Asthma Son    Asthma Grandson    Colon cancer Neg Hx    Rectal cancer Neg Hx    Stomach cancer Neg Hx     Allergies  Allergen Reactions   Reglan [Metoclopramide] Other (See Comments)    Panic attack    Outpatient Medications Prior to Visit  Medication Sig Dispense Refill   albuterol (PROAIR HFA) 108 (90 Base) MCG/ACT inhaler Inhale 2 puffs into the lungs every 6 (six) hours as needed for wheezing or shortness of breath. 18 g 6   albuterol (PROVENTIL) (2.5 MG/3ML) 0.083% nebulizer solution Take 3 mLs (2.5 mg total) by nebulization every 6 (six) hours as needed for wheezing or shortness of breath. 75 mL 3    atorvastatin (LIPITOR) 20 MG tablet Take 1 tablet (20 mg total) by mouth daily. 90 tablet 1   Blood Glucose Monitoring Suppl (ONETOUCH VERIO) w/Device KIT 1 each by Does not apply route daily. Use to monitor glucose levels daily; E11.9 1 kit 0   buPROPion (WELLBUTRIN SR) 150 MG 12 hr tablet Take 1 tablet (150 mg total) by mouth 2 (two) times daily. 180 tablet 1   cetirizine (ZYRTEC) 10 MG tablet Take 1 tablet (10 mg total) by mouth at bedtime. 30 tablet 5   dapagliflozin propanediol (FARXIGA) 5 MG TABS tablet Take 1 tablet (5 mg total) by mouth daily before breakfast. 30 tablet 3   diclofenac Sodium (VOLTAREN) 1 % GEL Apply 4 g topically 4 (four) times daily. 100 g 1   furosemide (LASIX) 20 MG tablet Take 1 tablet (20 mg total) by mouth daily. 30 tablet 2   gabapentin (NEURONTIN) 300 MG capsule Take 2 capsules (600 mg total) by mouth 2 (two) times daily. 360 capsule 1   glucose blood (ONETOUCH VERIO) test strip Use to monitor glucose levels daily; E11.9 100 each 2   hydrOXYzine (ATARAX/VISTARIL) 25 MG tablet Take 1 tablet (25 mg total) by mouth 3 (three) times daily as needed for anxiety. 180 tablet 1   insulin degludec (TRESIBA FLEXTOUCH) 200 UNIT/ML FlexTouch Pen Inject 160 Units into the skin daily. 84 mL 3   Insulin Pen Needle 31G X 8 MM MISC Inject 1 Units as directed daily.     Melatonin 3 MG TABS Take 3 mg by mouth at bedtime as needed (for sleep).      methocarbamol (ROBAXIN) 750 MG tablet Take 750 mg by mouth 3 (three) times daily.     Misc. Devices MISC Nebulizer device. Diagnosis - Asthma 1 each 0   Misc. Devices [employer].  Diagnosis-low back pain 1 each 0   Misc. Devices MISC Shower chair 1 each 0   mometasone-formoterol (DULERA) 200-5 MCG/ACT AERO Inhale 2 puffs into the lungs in the morning and at bedtime. 1 each 6   ondansetron (ZOFRAN) 4 MG tablet Take 1 tablet (4 mg total) by mouth every 8 (eight) hours as needed for nausea or vomiting. 30 tablet 1   OneTouch Delica Lancets  07P MISC 1 each by Does not apply route daily. Use to monitor glucose levels daily; E11.9 100 each 2   pantoprazole (PROTONIX) 40 MG tablet Take 1 tablet (40 mg total) by mouth 2 (two) times daily. 60 tablet 3   Semaglutide,0.25 or 0.5MG/DOS, (OZEMPIC, 0.25 OR 0.5 MG/DOSE,) 2 MG/1.5ML SOPN Inject 0.5 mg into the skin once a week. 4.5 mL 3   sertraline (ZOLOFT) 100 MG tablet Take 1 tablet (100 mg  total) by mouth at bedtime. 90 tablet 1   silver sulfADIAZINE (SILVADENE) 1 % cream Apply 1 application topically daily. 50 g 1   sucralfate (CARAFATE) 1 g tablet Take 1 tablet (1 g total) by mouth 4 (four) times daily -  with meals and at bedtime. 120 tablet 1   SUMAtriptan (IMITREX) 100 MG tablet At the onset of a migraine; may repeat in 2 hours. Max daily dose 253m (Patient taking differently: Take 100 mg by mouth every 2 (two) hours as needed for migraine.) 20 tablet 6   tiZANidine (ZANAFLEX) 4 MG tablet Take 1 tablet (4 mg total) by mouth every 8 (eight) hours as needed for muscle spasms. 60 tablet 6   topiramate (TOPAMAX) 100 MG tablet Take 1.5 tablets (150 mg total) by mouth 2 (two) times daily. 90 tablet 0   No facility-administered medications prior to visit.     ROS Review of Systems  Constitutional:  Negative for activity change, appetite change and fatigue.  HENT:  Negative for congestion, sinus pressure and sore throat.   Eyes:  Negative for visual disturbance.  Respiratory:  Negative for cough, chest tightness, shortness of breath and wheezing.   Cardiovascular:  Negative for chest pain and palpitations.  Gastrointestinal:  Negative for abdominal distention, abdominal pain and constipation.  Endocrine: Negative for polydipsia.  Genitourinary:  Negative for dysuria and frequency.  Musculoskeletal:  Negative for arthralgias and back pain.  Skin:  Negative for rash.  Neurological:  Negative for tremors, light-headedness and numbness.  Hematological:  Does not bruise/bleed easily.   Psychiatric/Behavioral:  Negative for agitation and behavioral problems.    Objective:  BP 126/79   Pulse (!) 104   Ht 5' 1"  (1.549 m)   Wt 246 lb (111.6 kg)   LMP  (LMP Unknown)   SpO2 100%   BMI 46.48 kg/m   BP/Weight 06/18/2021 05/29/2021 78/88/2800 Systolic BP [id number] Diastolic BP 79 87 76  Wt. (Lbs) 246 236 236  BMI 46.48 44.59 44.59      Physical Exam Constitutional:      Appearance: She is well-developed.  Cardiovascular:     Rate and Rhythm: Tachycardia present.     Heart sounds: Normal heart sounds. No murmur heard. Pulmonary:     Effort: Pulmonary effort is normal.     Breath sounds: Normal breath sounds. No wheezing or rales.  Chest:     Chest wall: No tenderness.  Abdominal:     General: Bowel sounds are normal. There is no distension.     Palpations: Abdomen is soft. There is no mass.     Tenderness: There is no abdominal tenderness.  Musculoskeletal:        General: Normal range of motion.     Right lower leg: No edema.     Left lower leg: No edema.  Neurological:     Mental Status: She is alert and oriented to person, place, and time.  Psychiatric:        Mood and Affect: Mood normal.    CMP Latest Ref Rng & Units 04/01/2021 04/01/2021 02/22/2021  Glucose 70 - 99 mg/dL 216(H) 219(H) 163(H)  BUN 6 - 20 mg/dL 15 12 21(H)  Creatinine 0.44 - 1.00 mg/dL 1.30(H) 1.26(H) 1.55(H)  Sodium 135 - 145 mmol/L 140 137 137  Potassium 3.5 - 5.1 mmol/L 4.0 4.0 3.9  Chloride 98 - 111 mmol/L 108 107 109  CO2 22 - 32 mmol/L - 23 23  Calcium 8.9 -  10.3 mg/dL - 8.7(L) 9.0  Total Protein 6.5 - 8.1 g/dL - - -  Total Bilirubin 0.3 - 1.2 mg/dL - - -  Alkaline Phos 38 - 126 U/L - - -  AST 15 - 41 U/L - - -  ALT 0 - 44 U/L - - -    Lipid Panel     Component Value Date/Time   CHOL 165 06/15/2018 1031   TRIG 124 06/15/2018 1031   HDL 51 06/15/2018 1031   CHOLHDL 3.2 06/15/2018 1031   CHOLHDL 3.9 02/22/2016 0954   VLDL 26 02/22/2016 0954   LDLCALC 89 06/15/2018  1031    CBC    Component Value Date/Time   WBC 7.3 04/01/2021 1540   RBC 3.81 (L) 04/01/2021 1540   HGB 10.5 (L) 04/01/2021 1626   HGB 11.9 04/12/2018 1035   HCT 31.0 (L) 04/01/2021 1626   HCT 34.8 04/12/2018 1035   PLT 342 04/01/2021 1540   PLT 373 04/12/2018 1035   MCV 87.1 04/01/2021 1540   MCV 84 04/12/2018 1035   MCH 28.1 04/01/2021 1540   MCHC 32.2 04/01/2021 1540   RDW 14.0 04/01/2021 1540   RDW 14.6 04/12/2018 1035   LYMPHSABS 3.0 04/01/2021 1540   LYMPHSABS 4.0 (H) 04/12/2018 1035   MONOABS 0.5 04/01/2021 1540   EOSABS 0.0 04/01/2021 1540   EOSABS 0.0 04/12/2018 1035   BASOSABS 0.0 04/01/2021 1540   BASOSABS 0.0 04/12/2018 1035    Lab Results  Component Value Date   HGBA1C 7.8 (A) 06/18/2021    Assessment & Plan:  1. Insulin dependent type 2 diabetes mellitus (HCC) Slightly above goal of less than 7.0 with A1c of 7.8 A1c is reasonable to proceed with surgery Management as per endocrine - POCT glycosylated hemoglobin (Hb A1C)  2. Preoperative clearance Her functional capacity is less than 4 METS EKG from 03/2021 was unremarkable Currently on a statin which she will continue Low risk of MACE, no additional cardiovascular testing recommended Pulmonary clearance will also be coming from her pulmonologist.  3. Bulging lumbar disc Currently being worked up for elective lumbar spine surgery Follow-up with Dr. Louanne Skye   No orders of the defined types were placed in this encounter.   Follow-up: Return for medical conditions, keep previously scheduled appointment.       [name], MD, FAAFP. [facility] and [place], [place]   06/18/2021, 12:56 PM

## 2021-06-20 ENCOUNTER — Encounter: Payer: Self-pay | Admitting: Adult Health

## 2021-06-20 ENCOUNTER — Ambulatory Visit (INDEPENDENT_AMBULATORY_CARE_PROVIDER_SITE_OTHER): Payer: 59 | Admitting: Adult Health

## 2021-06-20 ENCOUNTER — Other Ambulatory Visit: Payer: Self-pay

## 2021-06-20 VITALS — BP 118/80 | HR 119 | Temp 98.4°F | Ht 61.0 in | Wt 238.2 lb

## 2021-06-20 DIAGNOSIS — Z930 Tracheostomy status: Secondary | ICD-10-CM

## 2021-06-20 DIAGNOSIS — J9611 Chronic respiratory failure with hypoxia: Secondary | ICD-10-CM | POA: Diagnosis not present

## 2021-06-20 DIAGNOSIS — J4540 Moderate persistent asthma, uncomplicated: Secondary | ICD-10-CM

## 2021-06-20 DIAGNOSIS — Z23 Encounter for immunization: Secondary | ICD-10-CM

## 2021-06-20 DIAGNOSIS — Z01811 Encounter for preprocedural respiratory examination: Secondary | ICD-10-CM | POA: Diagnosis not present

## 2021-06-20 NOTE — Assessment & Plan Note (Signed)
Preop pulmonary risk assessment.  Patient is independent lives at home.  Able to do light household chores.  Asthma appears to be stable without recent exacerbation.  Patient does have a chronic trach which appears to be stable.  She is also on oxygen at bedtime via trach collar at 2 L / 40% FiO2.- Patient is at a mild to moderate pulmonary surgical risk .    Major Pulmonary risks identified in the multifactorial risk analysis are but not limited to a) pneumonia; b) recurrent intubation risk; c) prolonged or recurrent acute respiratory failure needing mechanical ventilation; d) prolonged hospitalization; e) DVT/Pulmonary embolism; f) Acute Pulmonary edema  Recommend 1. Short duration of surgery as much as possible and avoid paralytic if possible 2. Recovery in step down or ICU with Pulmonary consultation if indicated  3. DVT prophylaxis if indicated.  4. Aggressive pulmonary toilet with o2, bronchodilatation, and incentive spirometry and early ambulation Trach care .

## 2021-06-20 NOTE — Patient Instructions (Signed)
Continue on Dulera 2 puffs Twice daily.  Albuterol inhaler or neb as needed  Flu shot today  Continue on trach collar with oxygen 2l/m/40% Fio2 At bedtime  .  Follow up with Dr. Shearon Stalls in 6 months and As needed

## 2021-06-20 NOTE — Assessment & Plan Note (Signed)
Moderate persistent asthma.  Patient is unable to have PFTs as she is unable to cap her trach. Appears to be controlled.  She has had no increased albuterol use.  No recent steroid use.  Activity level remains at baseline.  Plan  Patient Instructions  Continue on Dulera 2 puffs Twice daily.  Albuterol inhaler or neb as needed  Flu shot today  Continue on trach collar with oxygen 2l/m/40% Fio2 At bedtime  .  Follow up with Dr. Shearon Stalls in 6 months and As needed

## 2021-06-20 NOTE — Progress Notes (Signed)
_0  ID: Reshanda Mae Windish, female    DOB: [birth year], 50 y.o.   MRN: [id number]  Chief Complaint  Patient presents with   Follow-up    Referring provider: [name], MD  HPI: 50 year old female seen for pulmonary consult March 06, 2021 for severe persistent asthma, subglottic stenosis status post tracheostomy after traumatic intubation in 2018.  Patient has a chronic #4 cuffless trach followed by [facility]. Hospitalized December 2021 for COVID-19 infection required treatment with remdesivir. Medical history significant for type 1 diabetes, severe OSA   TEST/EVENTS :   06/20/2021 Follow up : Asthma , Chronic Trach  Patient presents for a 25-monthfollow-up.  Patient was seen for pulmonary consult March 06, 2021 to establish for severe persistent asthma.  She has a chronic #4 cuffless trach [facility]  This occurred after traumatic intubation in 2018 with subsequent subglottic stenosis.  She has been unable to tolerate capping trials or Passy-Muir valve.  She has been unable to get PFTs due to this.  She is remained on Dulera twice daily.  She was set up for an overnight oximetry test.  Patient was recommended to begin oxygen 2 L / 40% FiO2 with trach collar at nighttime. Says she is doing well on oxygen .  Since last visit patient says she is doing well since last visit. No flare in cough or congestion . No increased dyspnea. No increased albuterol inhaler /neb use.   Lives at home , with husband , daughter and grandson. Former smoker. Disabled.  Able to do light household chores and cooking .  Has trouble due to chronic back pain limits her activities . Does not drive.   Patient is here for a preop pulmonary risk assessment.  She is undergoing a lumbar fusion at L4-L5 with rods, cages and screws and local bone graft.  Per Dr. NLouanne Skye  Date of surgery is pending .   Had oral surgery with general anesthesia , did  well with no known issues.   Chest xray 04/01/21 showed clear lungs. O2 sats today in office 100% on room air .    Allergies  Allergen Reactions   Reglan [Metoclopramide] Other (See Comments)    Panic attack    Immunization History  Administered Date(s) Administered   Influenza,inj,Quad PF,6+ Mos 06/05/2014, 08/01/2015, 07/02/2016, 06/19/2017, 06/20/2021   PFIZER(Purple Top)SARS-COV-2 Vaccination 08/29/2020   Pneumococcal Polysaccharide-23 04/29/2013, 02/21/2017    Past Medical History:  Diagnosis Date   Allergy    Anxiety    Arthritis    Asthma    Chronic back pain    Chronic chest pain    resolved, no problems since 2019 per patient 07/27/19   COPD (chronic obstructive pulmonary disease) (HCC)    Depression    [place] mellitus) [place]    INSULIN DEPENDENT - TYPE 1   GERD (gastroesophageal reflux disease)    Headache(784.0)    Hyperlipidemia    no med, diet controlled   Hypertension    Hypokalemia    Respiratory disease 05/2017   uses inhalers, neb tx prn, no oxygen   Sleep apnea    does not use CPAP due to trach   Tracheostomy in place ([facility] 02/2017    Tobacco History: Social History   Tobacco Use  Smoking Status Former   Packs/day: 0.50   Years: 25.00   Pack years: 12.50   Types: Cigarettes   Quit date: 01/21/2016   Years since quitting: 5.4  Smokeless Tobacco Never   Counseling given: Not Answered   Outpatient Medications Prior to Visit  Medication Sig Dispense Refill   albuterol (PROAIR HFA) 108 (90 Base) MCG/ACT inhaler Inhale 2 puffs into the lungs every 6 (six) hours as needed for wheezing or shortness of breath. 18 g 6   albuterol (PROVENTIL) (2.5 MG/3ML) 0.083% nebulizer solution Take 3 mLs (2.5 mg total) by nebulization every 6 (six) hours as needed for wheezing or shortness of breath. 75 mL 3   atorvastatin (LIPITOR) 20 MG tablet Take 1 tablet (20 mg total) by mouth daily. 90 tablet 1   Blood Glucose Monitoring Suppl (ONETOUCH VERIO)  w/Device KIT 1 each by Does not apply route daily. Use to monitor glucose levels daily; E11.9 1 kit 0   buPROPion (WELLBUTRIN SR) 150 MG 12 hr tablet Take 1 tablet (150 mg total) by mouth 2 (two) times daily. 180 tablet 1   cetirizine (ZYRTEC) 10 MG tablet Take 1 tablet (10 mg total) by mouth at bedtime. 30 tablet 5   dapagliflozin propanediol (FARXIGA) 5 MG TABS tablet Take 1 tablet (5 mg total) by mouth daily before breakfast. 30 tablet 3   diclofenac Sodium (VOLTAREN) 1 % GEL Apply 4 g topically 4 (four) times daily. 100 g 1   furosemide (LASIX) 20 MG tablet Take 1 tablet (20 mg total) by mouth daily. 30 tablet 2   gabapentin (NEURONTIN) 300 MG capsule Take 2 capsules (600 mg total) by mouth 2 (two) times daily. 360 capsule 1   glucose blood (ONETOUCH VERIO) test strip Use to monitor glucose levels daily; E11.9 100 each 2   hydrOXYzine (ATARAX/VISTARIL) 25 MG tablet Take 1 tablet (25 mg total) by mouth 3 (three) times daily as needed for anxiety. 180 tablet 1   insulin degludec (TRESIBA FLEXTOUCH) 200 UNIT/ML FlexTouch Pen Inject 160 Units into the skin daily. 84 mL 3   Insulin Pen Needle 31G X 8 MM MISC Inject 1 Units as directed daily.     Melatonin 3 MG TABS Take 3 mg by mouth at bedtime as needed (for sleep).      methocarbamol (ROBAXIN) 750 MG tablet Take 750 mg by mouth 3 (three) times daily.     Misc. Devices MISC Nebulizer device. Diagnosis - Asthma 1 each 0   Misc. Devices [employer].  Diagnosis-low back pain 1 each 0   Misc. Devices MISC Shower chair 1 each 0   mometasone-formoterol (DULERA) 200-5 MCG/ACT AERO Inhale 2 puffs into the lungs in the morning and at bedtime. 1 each 6   ondansetron (ZOFRAN) 4 MG tablet Take 1 tablet (4 mg total) by mouth every 8 (eight) hours as needed for nausea or vomiting. 30 tablet 1   OneTouch Delica Lancets 44Y MISC 1 each by Does not apply route daily. Use to monitor glucose levels daily; E11.9 100 each 2   pantoprazole (PROTONIX) 40 MG tablet Take 1  tablet (40 mg total) by mouth 2 (two) times daily. 60 tablet 3   Semaglutide,0.25 or 0.5MG/DOS, (OZEMPIC, 0.25 OR 0.5 MG/DOSE,) 2 MG/1.5ML SOPN Inject 0.5 mg into the skin once a week. 4.5 mL 3   sertraline (ZOLOFT) 100 MG tablet Take 1 tablet (100 mg total) by mouth at bedtime. 90 tablet 1   silver sulfADIAZINE (SILVADENE) 1 % cream Apply 1 application topically daily. 50 g 1   sucralfate (CARAFATE) 1 g tablet Take 1 tablet (1 g total) by mouth 4 (four) times daily -  with meals and at  bedtime. 120 tablet 1   SUMAtriptan (IMITREX) 100 MG tablet At the onset of a migraine; may repeat in 2 hours. Max daily dose 23m (Patient taking differently: Take 100 mg by mouth every 2 (two) hours as needed for migraine.) 20 tablet 6   tiZANidine (ZANAFLEX) 4 MG tablet Take 1 tablet (4 mg total) by mouth every 8 (eight) hours as needed for muscle spasms. 60 tablet 6   topiramate (TOPAMAX) 100 MG tablet Take 1.5 tablets (150 mg total) by mouth 2 (two) times daily. 90 tablet 0   No facility-administered medications prior to visit.     Review of Systems:   Constitutional:   No  weight loss, night sweats,  Fevers, chills,  +fatigue, or  lassitude.  HEENT:   No headaches,  Difficulty swallowing,  Tooth/dental problems, or  Sore throat,                No sneezing, itching, ear ache, nasal congestion, post nasal drip,   CV:  No chest pain,  Orthopnea, PND, swelling in lower extremities, anasarca, dizziness, palpitations, syncope.   GI  No heartburn, indigestion, abdominal pain, nausea, vomiting, diarrhea, change in bowel habits, loss of appetite, bloody stools.   Resp: No shortness of breath with exertion or at rest.  No excess mucus, no productive cough,  No non-productive cough,  No coughing up of blood.  No change in color of mucus.  No wheezing.  No chest wall deformity  Skin: no rash or lesions.  GU: no dysuria, change in color of urine, no urgency or frequency.  No flank pain, no hematuria   MS:  No  joint pain or swelling.  No decreased range of motion.  No back pain.    Physical Exam  BP 118/80 (BP Location: Right Arm, Patient Position: Sitting, Cuff Size: Large)   Pulse (!) 119   Temp 98.4 F (36.9 C) (Oral)   Ht _0  (1.549 m)   Wt 238 lb 3.2 oz (108 kg)   LMP  (LMP Unknown)   SpO2 100%   BMI 45.01 kg/m   GEN: A/Ox3; pleasant , NAD, BMI 45    HEENT:  [place]/AT,  NOSE-clear, THROAT-clear, no lesions, no postnasal drip or exudate noted.   NECK:  Supple w/ fair ROM; no JVD; normal carotid impulses w/o bruits; no thyromegaly or nodules palpated; no lymphadenopathy.  TRACH is midline, dsg clean /dry   RESP  Clear  P & A; w/o, wheezes/ rales/ or rhonchi. no accessory muscle use, no dullness to percussion  CARD:  RRR, no m/r/g, tr  peripheral edema, pulses intact, no cyanosis or clubbing.  GI:   Soft & nt; nml bowel sounds; no organomegaly or masses detected.   Musco: Warm bil, no deformities or joint swelling noted.   Neuro: alert, no focal deficits noted.    Skin: Warm, no lesions or rashes    Lab Results:       Imaging: No results found.    No flowsheet data found.  No results found for: NITRICOXIDE      Assessment & Plan:   Asthma Moderate persistent asthma.  Patient is unable to have PFTs as she is unable to cap her trach. Appears to be controlled.  She has had no increased albuterol use.  No recent steroid use.  Activity level remains at baseline.  Plan  Patient Instructions  Continue on Dulera 2 puffs Twice daily.  Albuterol inhaler or neb as needed  Flu shot today  Continue on trach collar with oxygen 2l/m/40% Fio2 At bedtime  .  Follow up with Dr. Shearon Stalls in 6 months and As needed       Tracheostomy dependent [facility]) Chronic tracheostomy since 2018.  Appears to be stable.  Continue with daily trach care Continue on nocturnal oxygen via trach collar at bedtime  Chronic respiratory failure with hypoxia (HCC) Continue on oxygen via trach  collar at bedtime  Preop pulmonary/respiratory exam Preop pulmonary risk assessment.  Patient is independent lives at home.  Able to do light household chores.  Asthma appears to be stable without recent exacerbation.  Patient does have a chronic trach which appears to be stable.  She is also on oxygen at bedtime via trach collar at 2 L / 40% FiO2.- Patient is at a mild to moderate pulmonary surgical risk .    Major Pulmonary risks identified in the multifactorial risk analysis are but not limited to a) pneumonia; b) recurrent intubation risk; c) prolonged or recurrent acute respiratory failure needing mechanical ventilation; d) prolonged hospitalization; e) DVT/Pulmonary embolism; f) Acute Pulmonary edema  Recommend 1. Short duration of surgery as much as possible and avoid paralytic if possible 2. Recovery in step down or ICU with Pulmonary consultation if indicated  3. DVT prophylaxis if indicated.  4. Aggressive pulmonary toilet with o2, bronchodilatation, and incentive spirometry and early ambulation Trach care .       Rexene Edison, NP 06/20/2021

## 2021-06-20 NOTE — Assessment & Plan Note (Signed)
Continue on oxygen via trach collar at bedtime

## 2021-06-20 NOTE — Assessment & Plan Note (Signed)
Chronic tracheostomy since 2018.  Appears to be stable.  Continue with daily trach care Continue on nocturnal oxygen via trach collar at bedtime

## 2021-06-25 ENCOUNTER — Encounter: Payer: Self-pay | Admitting: Specialist

## 2021-06-26 NOTE — Telephone Encounter (Signed)
OV notes and surgical clearance forms have been faxed to [facility], confirmation received. Nothing further needed at this time.

## 2021-07-01 ENCOUNTER — Encounter: Payer: 59 | Admitting: Clinical

## 2021-07-09 IMAGING — MR MR LUMBAR SPINE W/O CM
4 of 5 series · 27 of 48 positions shown · non-contrast
Comparison: Lumbar MRI 01/30/2020

CLINICAL DATA: Bilateral leg paresthesia. Right hip flexor
weakness.

EXAM:
MRI LUMBAR SPINE WITHOUT CONTRAST
TECHNIQUE: Multiplanar, multisequence MR imaging of the lumbar spine was
performed. No intravenous contrast was administered.

[Series 3: T2 · sagittal · 4.0mm · 0.53mm/px · 6 of 13 slices shown (1 of 2)]
[im 1/13]
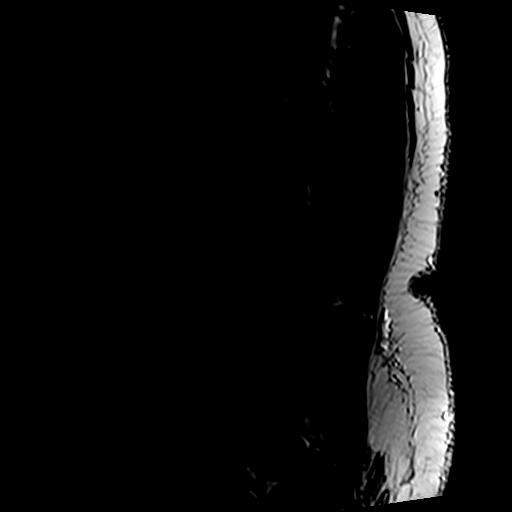
[im 3/13]
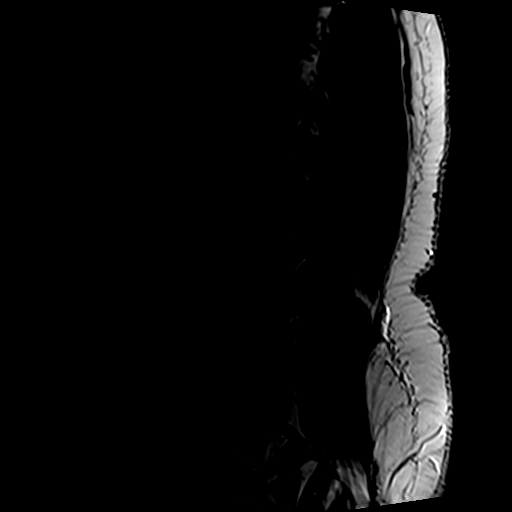
[im 5/13]
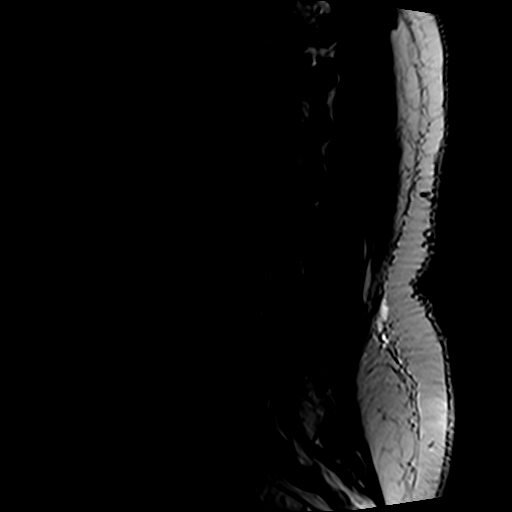
[im 8/13]
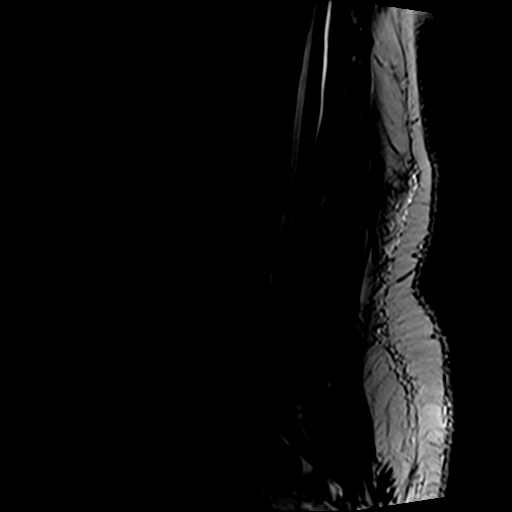
[im 10/13]
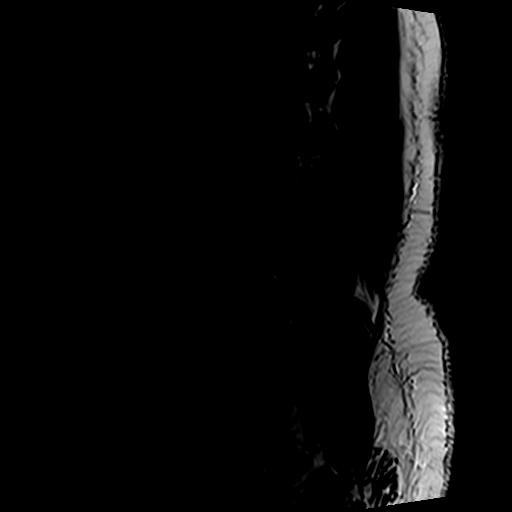
[im 13/13]
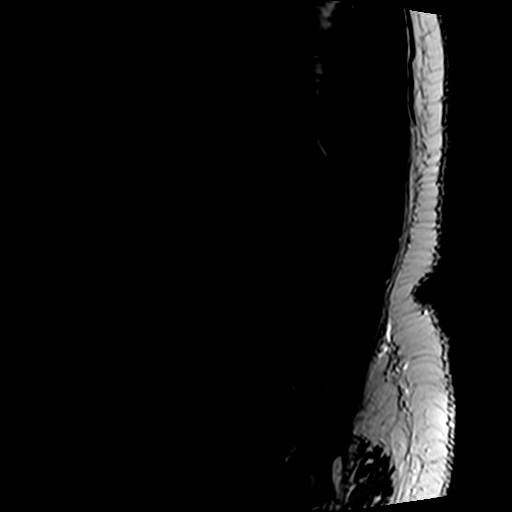

[Series 5: T1 · sagittal · 4.0mm · 0.53mm/px · 5 of 13 slices shown (1 of 2)]
[im 1/13]
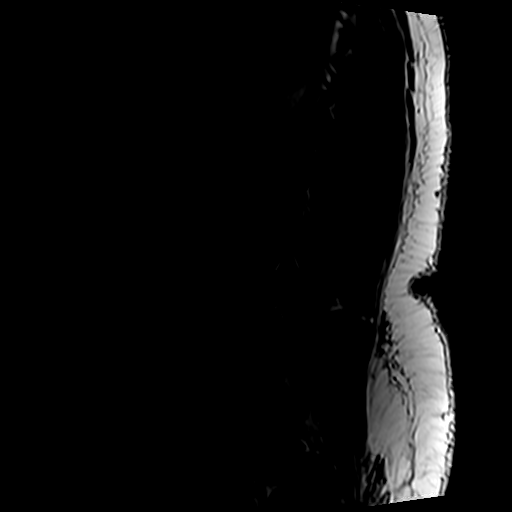
[im 4/13]
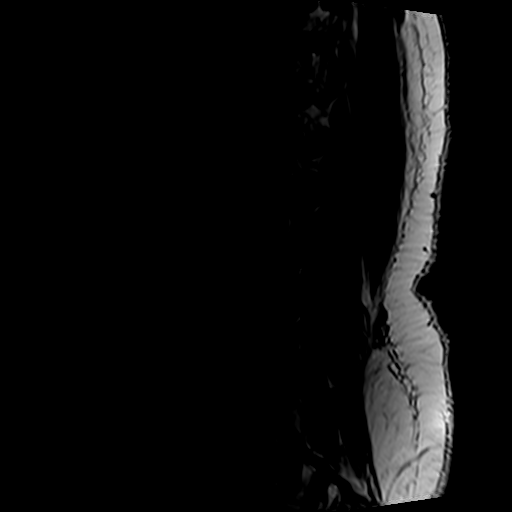
[im 7/13]
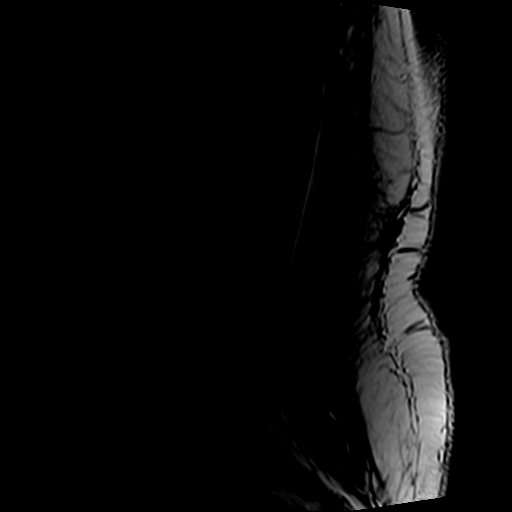
[im 10/13]
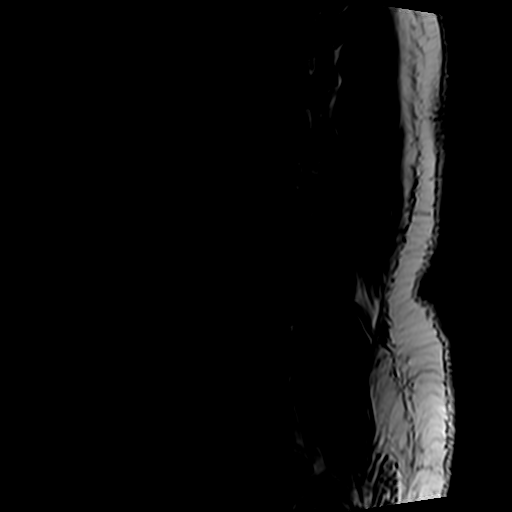
[im 13/13]
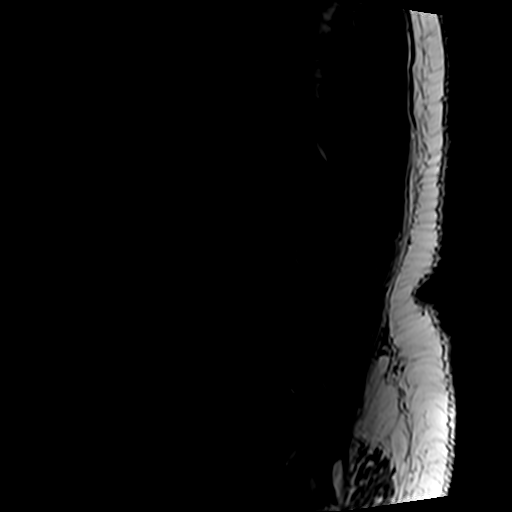

[Series 6: T2 · axial · 4.0mm · 0.70mm/px · z∈[-118,+87]mm · 10 of 40 slices shown (2 of 2)]
[im 3/40]
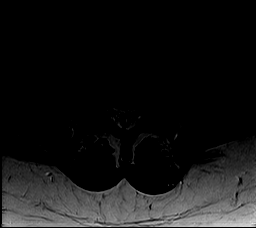
[im 6/40]
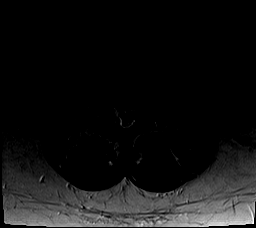
[im 8/40]
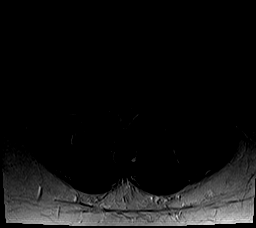
[im 14/40]
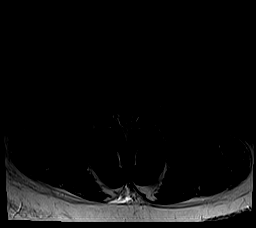
[im 19/40]
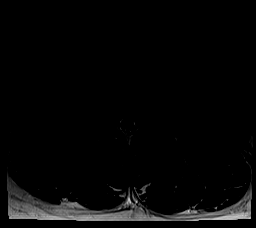
[im 21/40]
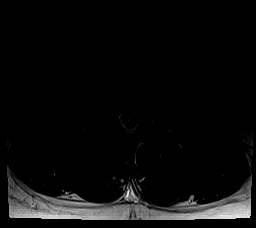
[im 24/40]
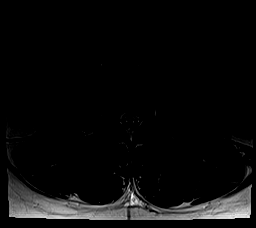
[im 29/40]
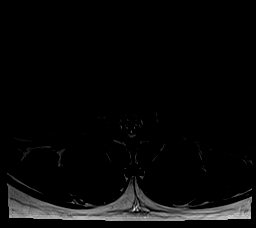
[im 34/40]
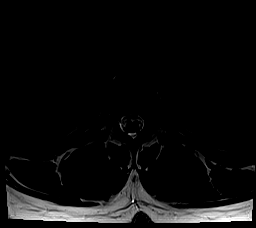
[im 40/40]
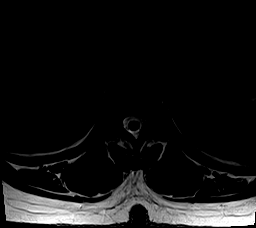

[Series 7: T1 · axial · 4.0mm · 0.35mm/px · z∈[-118,+56]mm · 6 of 41 slices shown (2 of 2)]
[im 3/41]
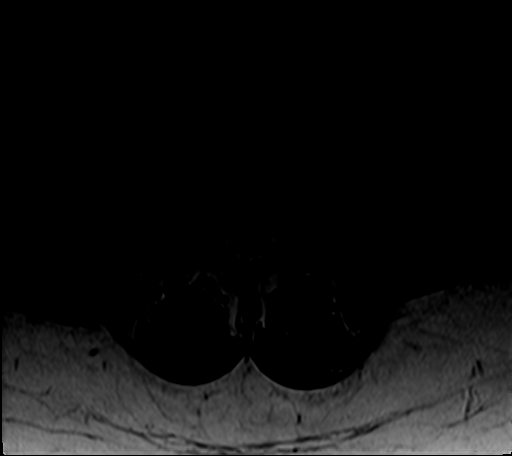
[im 6/41]
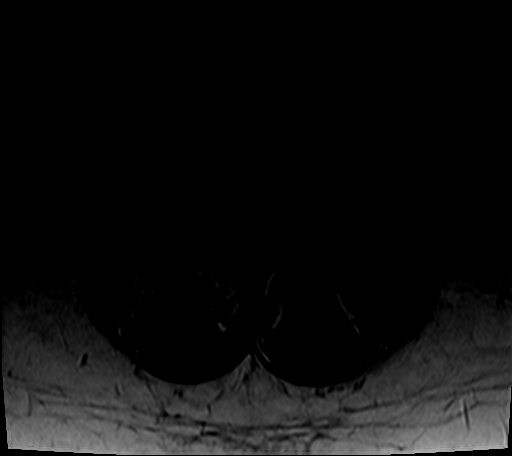
[im 9/41]
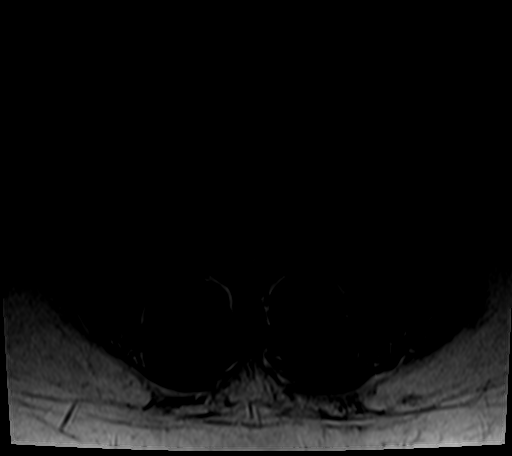
[im 14/41]
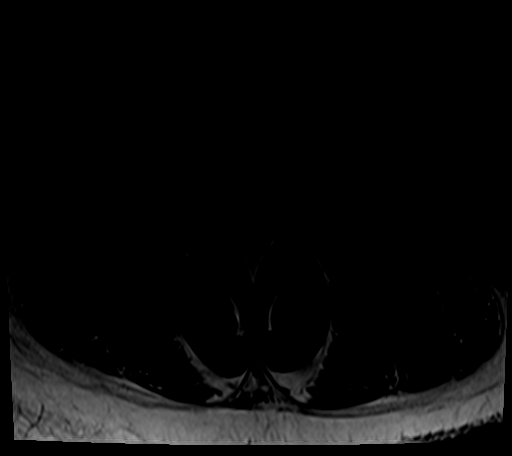
[im 22/41]
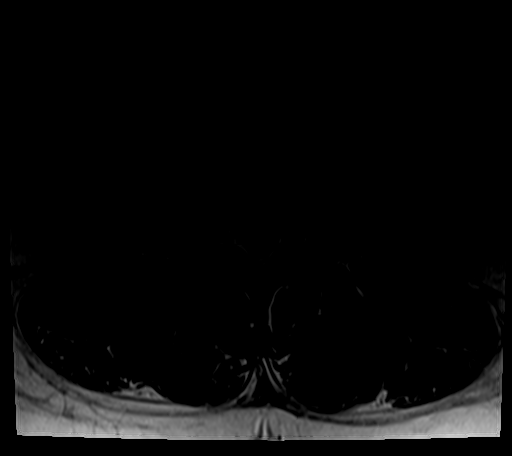
[im 35/41]
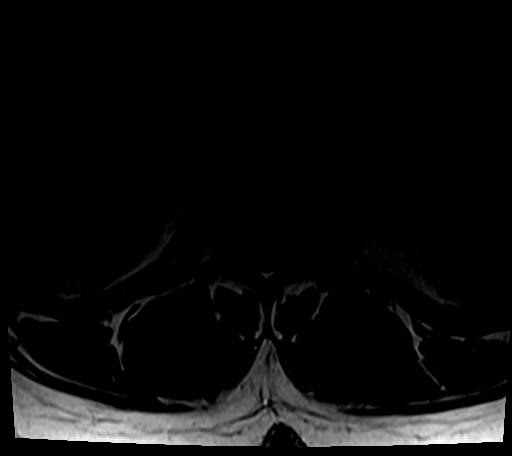

[27 of 48 positions shown; findings below may reference images not displayed]

FINDINGS: Segmentation:  Normal

Alignment:  Normal

Vertebrae:  Negative for fracture or mass.  Normal bone marrow.

Conus medullaris and cauda equina: Conus extends to the L1-2 level.
Conus and cauda equina appear normal.

Paraspinal and other soft tissues: Negative for paraspinous mass or
adenopathy. Epidural lipomatosis with progression from the prior
study.

Disc levels:

L1-2: Negative

L1-2: Negative

L2-3: Mild facet degeneration. Negative for disc protrusion or
stenosis

L4-5: Disc degeneration with disc bulging. Progressive central disc
protrusion. Progressive facet degeneration. Moderate spinal stenosis
has progressed in the interval. Neural foramina patent bilaterally.

L5-S1: Mild facet degeneration. Negative for disc protrusion or
stenosis.
IMPRESSION: Moderate spinal stenosis L4-5 has progressed since the prior MRI.
Progressive central disc protrusion and facet degeneration

Epidural lipomatosis with progression.

## 2021-07-09 IMAGING — MR MR THORACIC SPINE W/O CM
4 of 6 series · 25 of 48 positions shown · non-contrast
Comparison: CT chest 09/21/2020

CLINICAL DATA: Bilateral leg paresthesia. Right hip flexure and
quad weakness.

EXAM:
MRI THORACIC SPINE WITHOUT CONTRAST
TECHNIQUE: Multiplanar, multisequence MR imaging of the thoracic spine was
performed. No intravenous contrast was administered.

[Series 3: T2 · sagittal · 5.0mm · 1.56mm/px · 7 of 24 slices shown (1 of 2)]
[im 1/24]
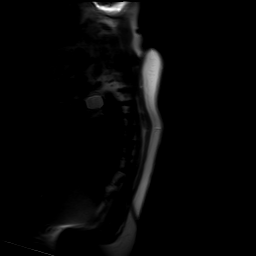
[im 4/24]
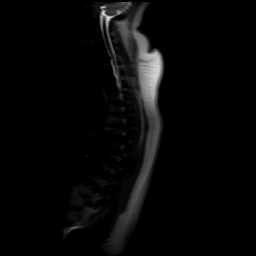
[im 8/24]
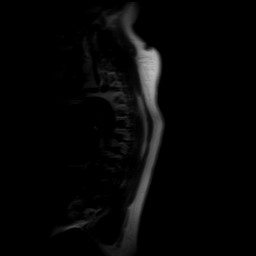
[im 12/24]
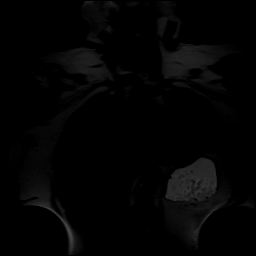
[im 16/24]
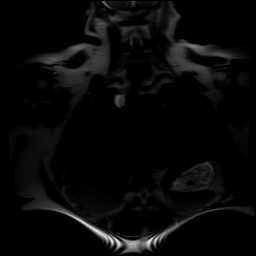
[im 20/24]
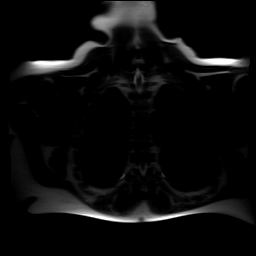
[im 24/24]
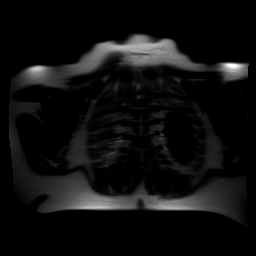

[Series 5: T2 post-contrast · sagittal · 3.0mm · 0.68mm/px · 5 of 19 slices shown]
[im 1/19]
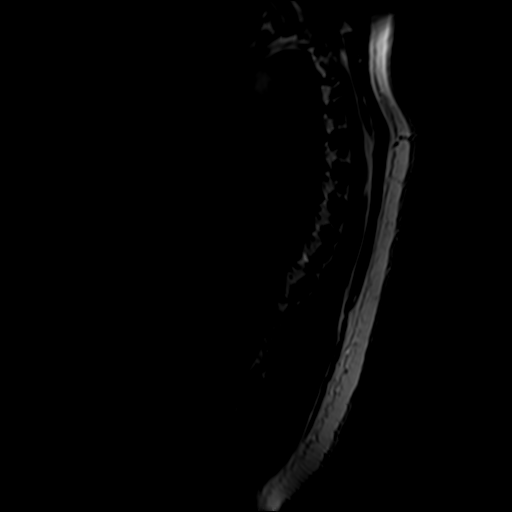
[im 5/19]
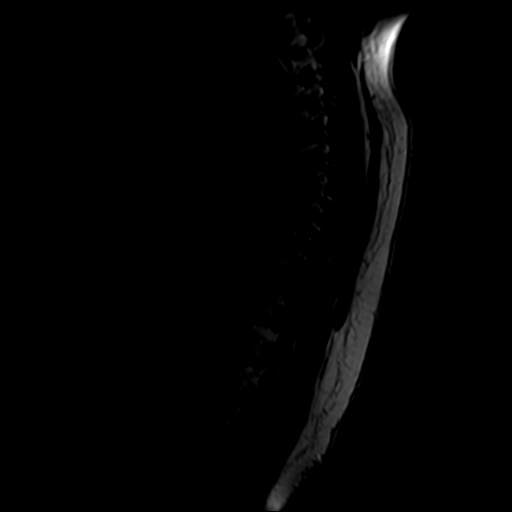
[im 10/19]
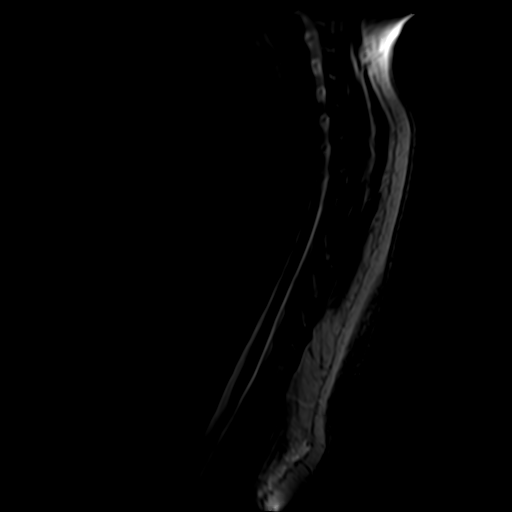
[im 14/19]
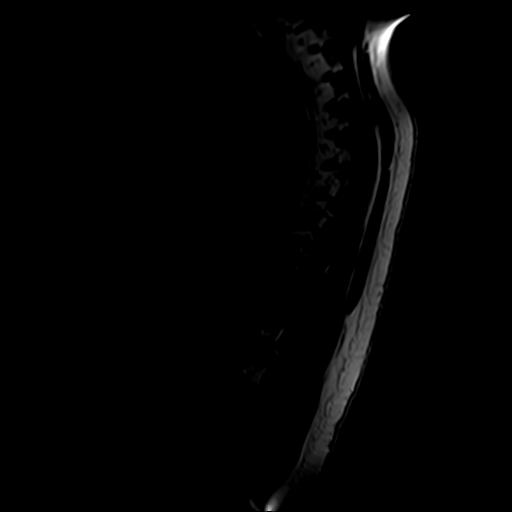
[im 19/19]
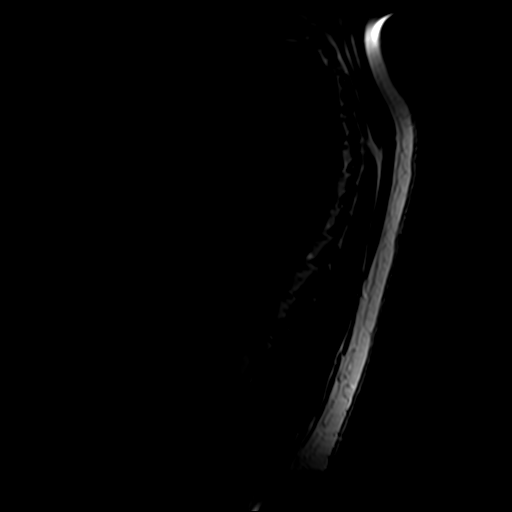

[Series 6: T1 · sagittal · 3.0mm · 0.68mm/px · 4 of 19 slices shown]
[im 1/19]
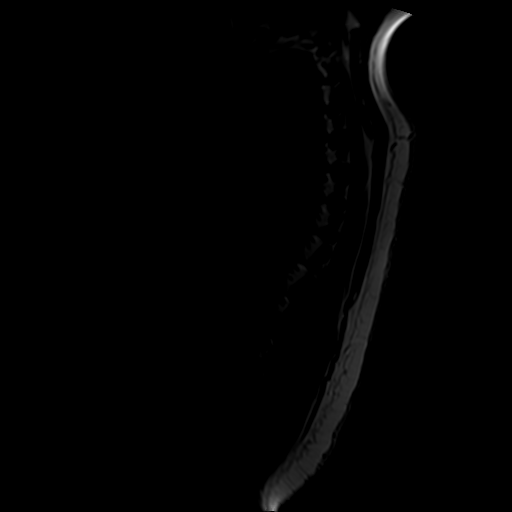
[im 5/19]
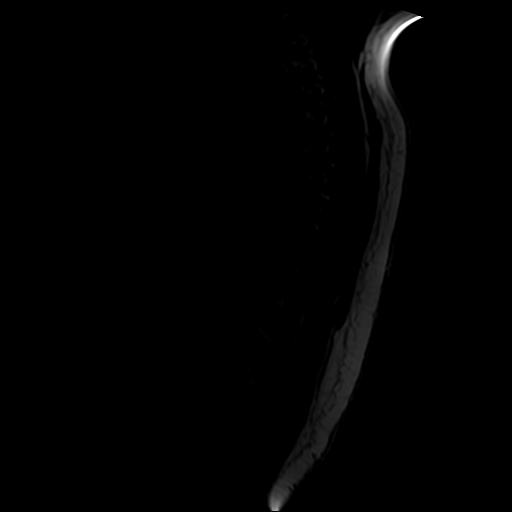
[im 10/19]
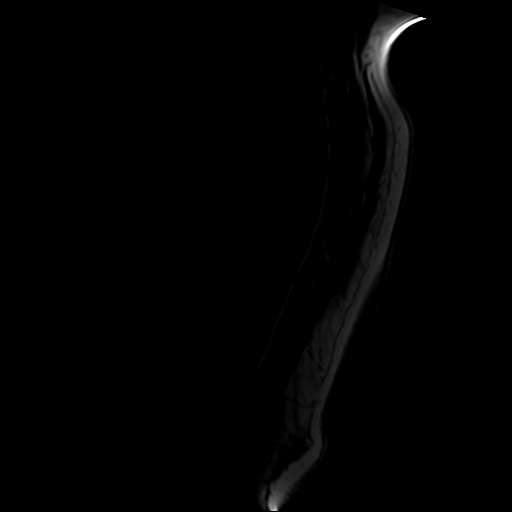
[im 19/19]
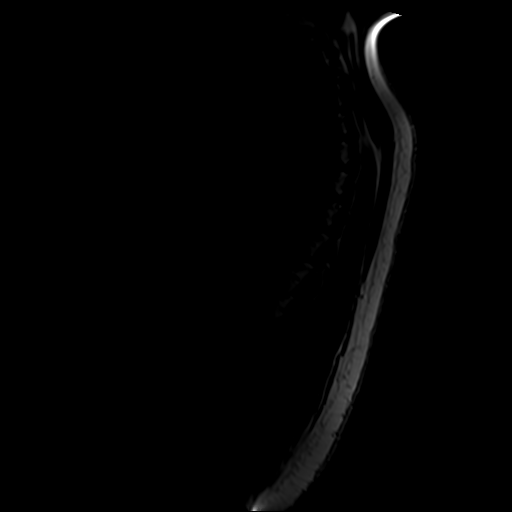

[Series 7: T2 · axial · 4.0mm · 0.39mm/px · z∈[-246,+30]mm · 9 of 49 slices shown (2 of 2)]
[im 1/49]
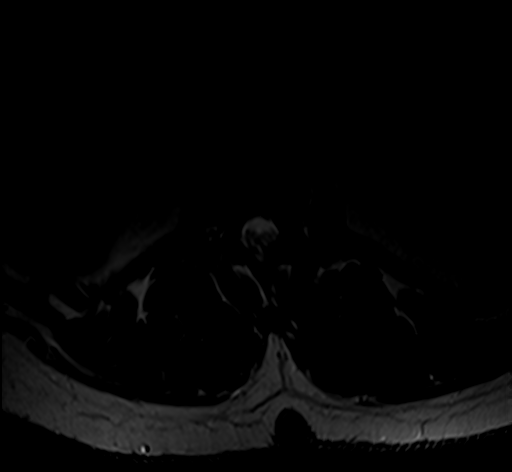
[im 9/49]
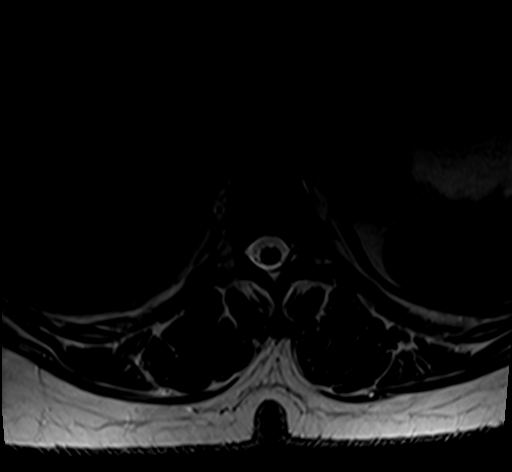
[im 17/49]
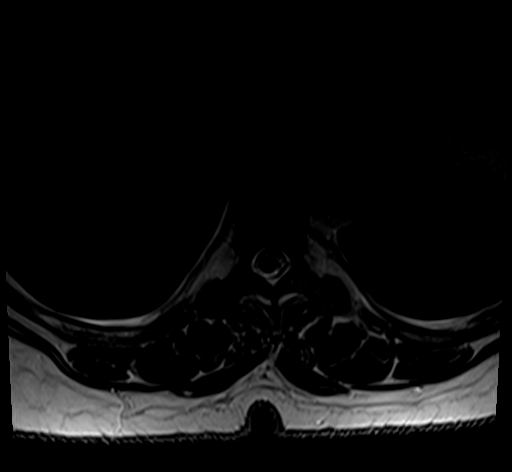
[im 21/49]
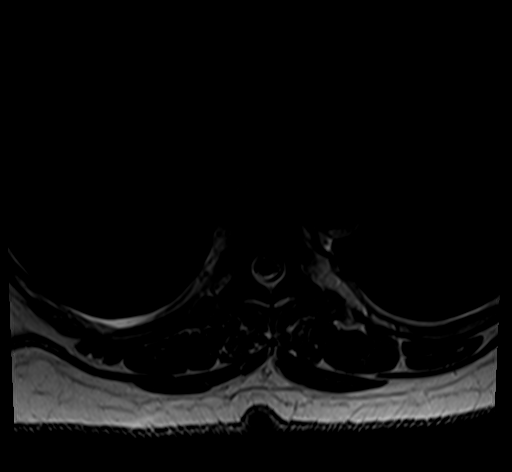
[im 25/49]
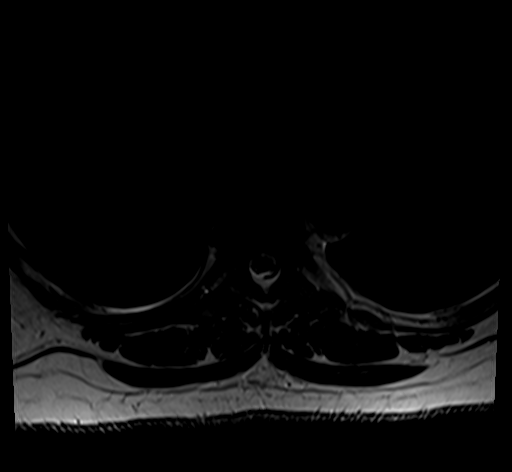
[im 29/49]
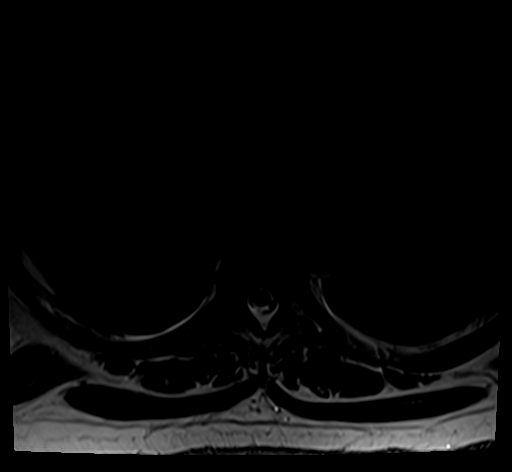
[im 33/49]
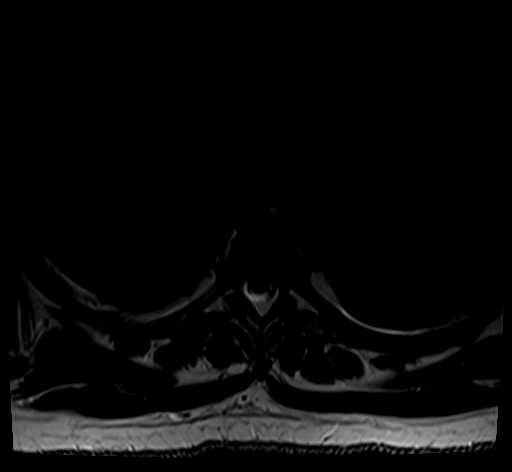
[im 41/49]
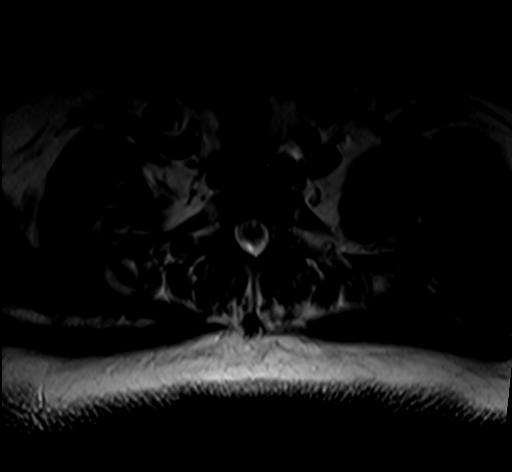
[im 49/49]
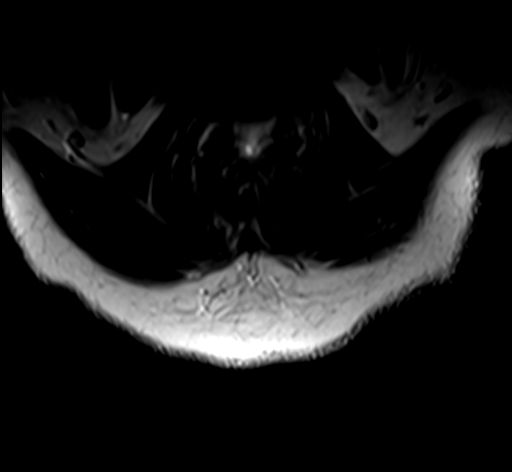

[25 of 48 positions shown; findings below may reference images not displayed]

FINDINGS: Alignment:  Normal

Vertebrae: Negative for fracture or mass.  No bone marrow edema.

Cord: Cord evaluation limited due to motion. No cord signal
abnormality detected

Paraspinal and other soft tissues: Right paratracheal cyst 2.2 x
cm unchanged from prior CT.

Disc levels:

Disc degeneration and mild spurring at multiple levels from T4
through T9. No significant spinal stenosis. No disc protrusion.
IMPRESSION: Mild thoracic disc degeneration and spurring without neural
impingement

Right paratracheal cyst, stable

## 2021-07-09 IMAGING — MR MR CERVICAL SPINE W/O CM
4 of 5 series · 29 of 48 positions shown · non-contrast
Comparison: Prior CT from 09/02/2018.

CLINICAL DATA: Initial evaluation for bilateral paresthesias with
weakness. History of back, leg, buttock, and neck pain.

EXAM:
MRI CERVICAL SPINE WITHOUT CONTRAST
TECHNIQUE: Multiplanar, multisequence MR imaging of the cervical spine was
performed. No intravenous contrast was administered.

[Series 4: T1 · sagittal · 3.0mm · 0.41mm/px · 8 of 13 slices shown]
[im 1/13]
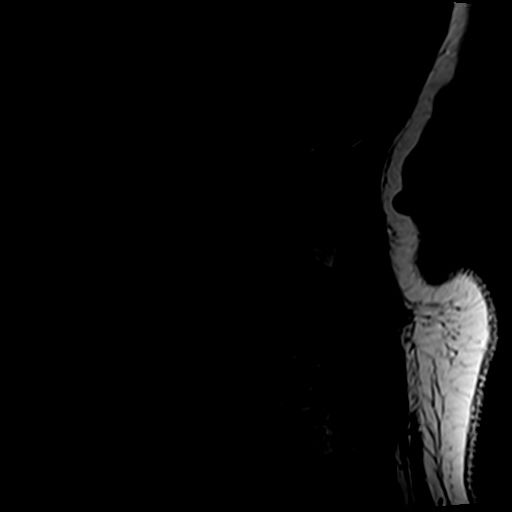
[im 2/13]
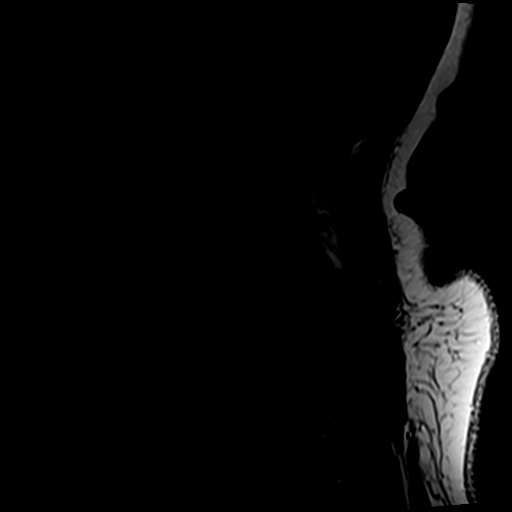
[im 4/13]
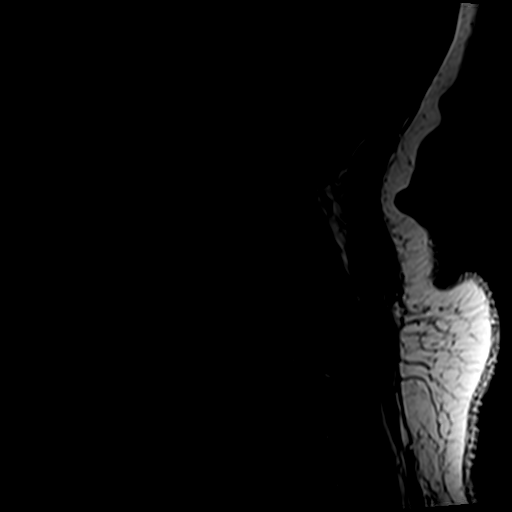
[im 6/13]
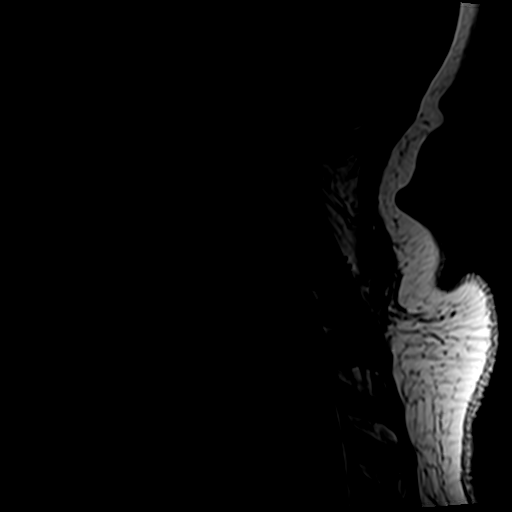
[im 7/13]
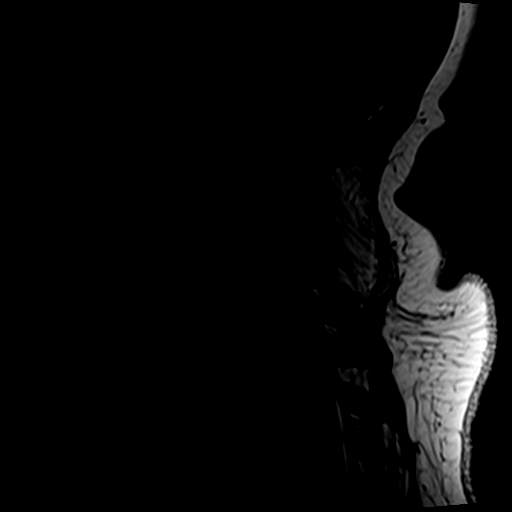
[im 9/13]
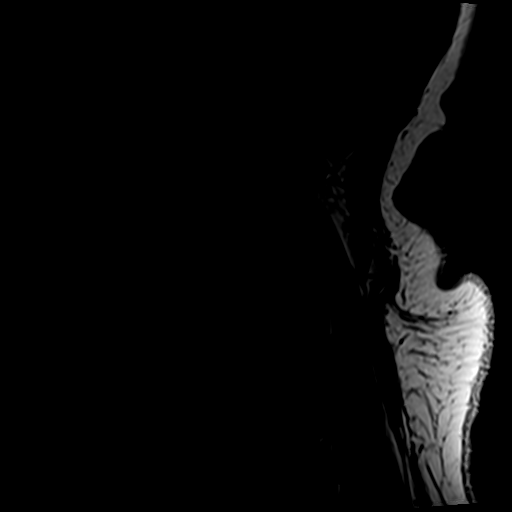
[im 11/13]
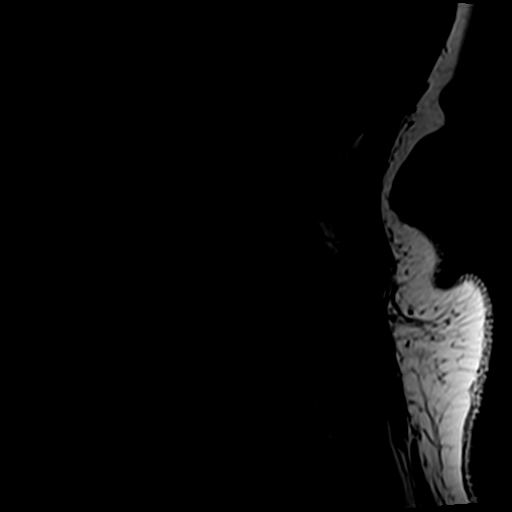
[im 13/13]
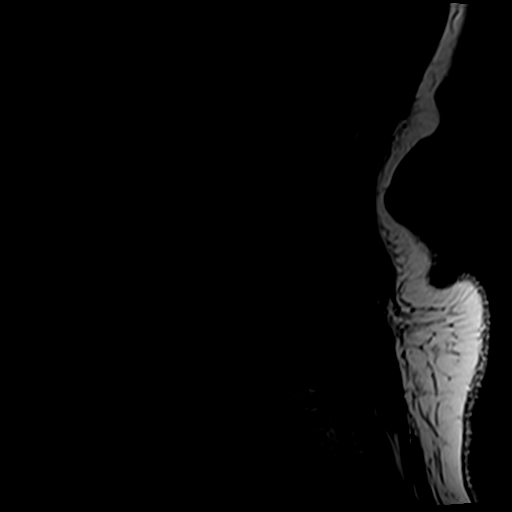

[Series 5: tir sag · sagittal · 3.0mm · 0.41mm/px · 5 of 13 slices shown]
[im 1/13]
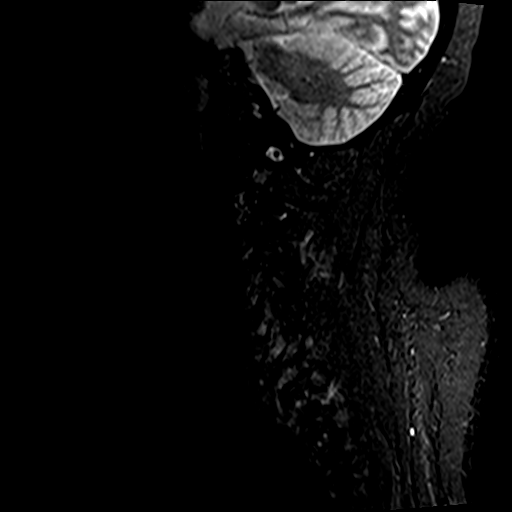
[im 3/13]
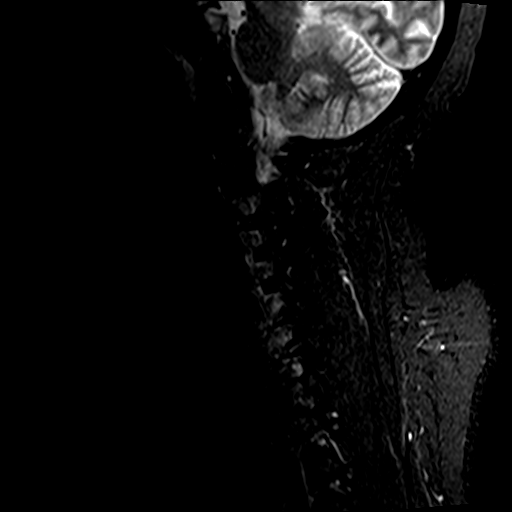
[im 5/13]
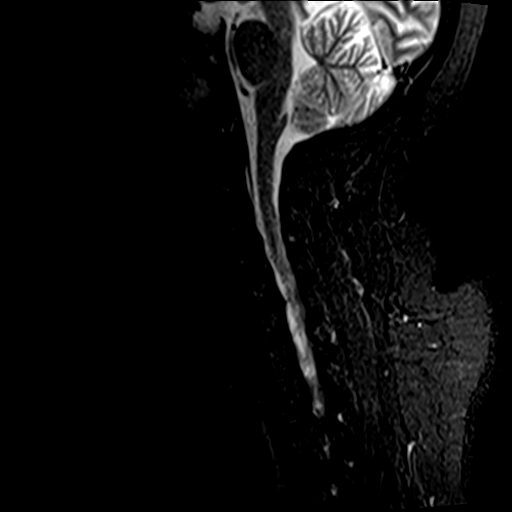
[im 7/13]
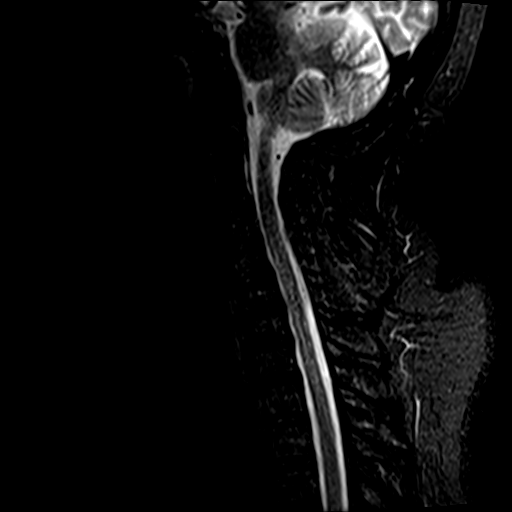
[im 11/13]
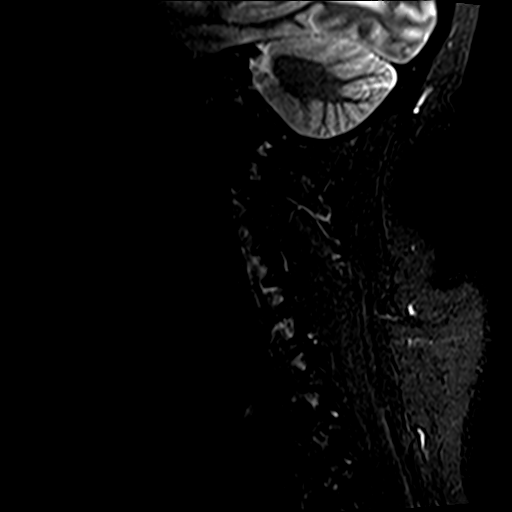

[Series 6: T2 · sagittal · 3.0mm · 0.82mm/px · 7 of 13 slices shown (1 of 2)]
[im 1/13]
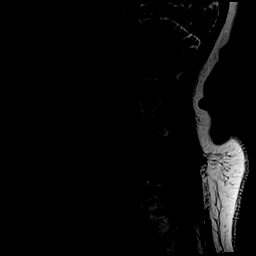
[im 3/13]
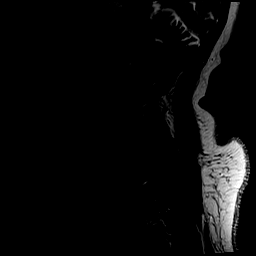
[im 5/13]
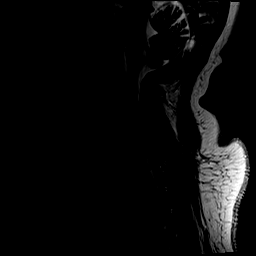
[im 7/13]
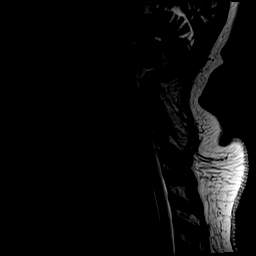
[im 9/13]
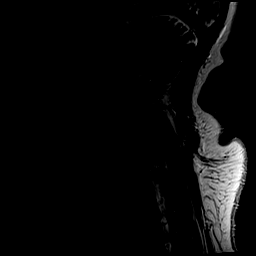
[im 11/13]
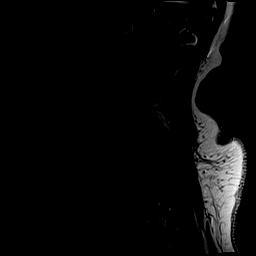
[im 13/13]
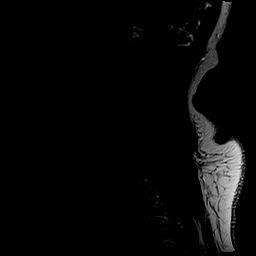

[Series 8: T2 · axial · 3.0mm · 0.70mm/px · z∈[-49,+33]mm · 9 of 25 slices shown (2 of 2)]
[im 1/25]
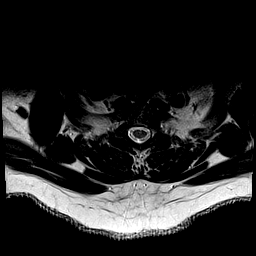
[im 5/25]
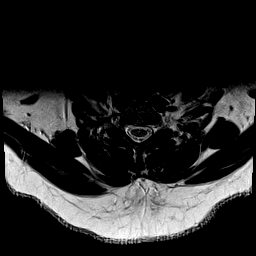
[im 9/25]
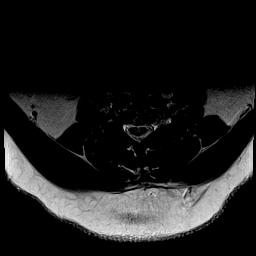
[im 11/25]
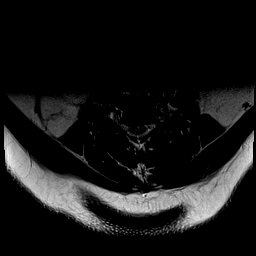
[im 13/25]
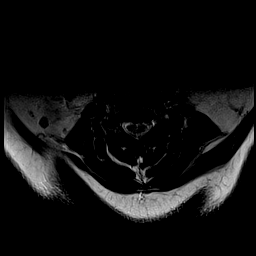
[im 15/25]
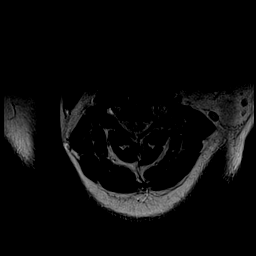
[im 17/25]
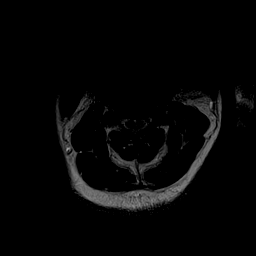
[im 21/25]
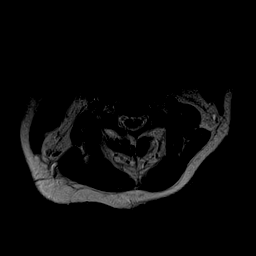
[im 25/25]
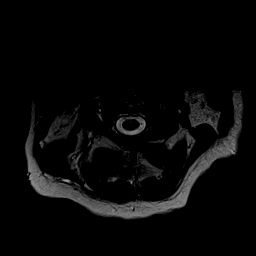

[29 of 48 positions shown; findings below may reference images not displayed]

FINDINGS: Alignment: Examination moderately degraded by motion artifact.

Straightening with smooth reversal of the normal cervical lordosis.
No listhesis or static subluxation.

Vertebrae: Vertebral body height maintained without acute or chronic
fracture. Bone marrow signal intensity somewhat diffusely decreased
on T1 weighted imaging, nonspecific, but most commonly related to
anemia, smoking, or obesity. No discrete or worrisome osseous
lesions. No abnormal marrow edema.

Cord: Signal intensity within the cervical spinal cord grossly
within normal limits on this motion degraded exam. Normal cord
caliber morphology.

Posterior Fossa, vertebral arteries, paraspinal tissues: Partially
visualized brain and posterior fossa within normal limits.
Craniocervical junction normal. Paraspinous and prevertebral soft
tissues within normal limits. Normal flow voids seen within the
vertebral arteries bilaterally.

Disc levels:

C2-C3: Minimal annular disc bulge. No spinal stenosis. Foramina
remain patent.

C3-C4: Mild disc bulge with left-sided uncovertebral hypertrophy.
Flattening of the ventral thecal sac without significant spinal
stenosis. Mild left C4 foraminal narrowing. Right neural foramen
remains patent.

C4-C5: Mild disc bulge with right-sided uncovertebral hypertrophy.
Flattening of the ventral thecal sac without significant spinal
stenosis. Moderate right C5 foraminal narrowing. Left neural
foramina remains patent.

C5-C6: Mild disc bulge with uncovertebral hypertrophy. Superimposed
small right paracentral disc protrusion indents the right ventral
thecal sac, contacting the right ventral cord (series 8, image 15).
No significant spinal stenosis or cord deformity. Mild right C6
foraminal narrowing. Left neural foramen remains patent.

C6-C7: Mild disc bulge with uncovertebral hypertrophy. No
significant spinal stenosis. Foramina remain patent.

C7-T1: Mild left-sided uncovertebral hypertrophy without significant
disc bulge. No significant canal or foraminal stenosis.

Visualized upper thoracic spine demonstrates no significant finding.
IMPRESSION: 1. Motion degraded exam.
2. Small right paracentral disc protrusion at C5-6, contacting the
right ventral spinal cord. No significant spinal stenosis.
3. Mild left C4 and right C6 foraminal stenosis, with moderate right
C5 foraminal narrowing related to disc bulge and uncovertebral
disease. No other significant foraminal encroachment within the
cervical spine.

## 2021-07-11 ENCOUNTER — Ambulatory Visit: Payer: 59 | Admitting: Endocrinology

## 2021-07-15 ENCOUNTER — Other Ambulatory Visit: Payer: Self-pay | Admitting: Family Medicine
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
## 2021-07-17 NOTE — Telephone Encounter (Signed)
I called patient to discuss scheduling surgery.  She stated she can't schedule right now because her daughter got shot last night and is paralyzed.  I advised her to call me back when she is ready to schedule.

## 2021-07-22 ENCOUNTER — Encounter: Payer: 59 | Admitting: Clinical

## 2021-07-26 ENCOUNTER — Ambulatory Visit (INDEPENDENT_AMBULATORY_CARE_PROVIDER_SITE_OTHER): Payer: 59 | Admitting: Endocrinology

## 2021-07-26 ENCOUNTER — Other Ambulatory Visit: Payer: Self-pay

## 2021-07-26 VITALS — BP 130/84 | HR 98 | Ht 61.0 in | Wt 244.6 lb

## 2021-07-26 DIAGNOSIS — Z794 Long term (current) use of insulin: Secondary | ICD-10-CM

## 2021-07-26 DIAGNOSIS — E119 Type 2 diabetes mellitus without complications: Secondary | ICD-10-CM

## 2021-07-26 LAB — POCT GLYCOSYLATED HEMOGLOBIN (HGB A1C): Hemoglobin A1C: 7.2 % — AB (ref 4.0–5.6)

## 2021-07-26 MED ORDER — TRESIBA FLEXTOUCH 200 UNIT/ML ~~LOC~~ SOPN: 155.0000 [IU] | PEN_INJECTOR | Freq: Every day | SUBCUTANEOUS | 3 refills | Status: DC
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
## 2021-07-26 NOTE — Patient Instructions (Addendum)
I have sent a prescription to your pharmacy: to increase the Tresiba to 155 units per day.   check your blood sugar twice a day.  vary the time of day when you check, between before the 3 meals, and at bedtime.  also check if you have symptoms of your blood sugar being too high or too low.  please keep a record of the readings and bring it to your next appointment here (or you can bring the meter itself).  You can write it on any piece of paper.  please call us sooner if your blood sugar goes below 70, or if you have a lot of readings over 200.   Please come back for a follow-up appointment in 4 months.

## 2021-08-04 IMAGING — MR MR KNEE*R* W/O CM
4 of 6 series · 23 of 40 positions shown · non-contrast
Comparison: None.

CLINICAL DATA: Pain for 3 months.  No known injury.

EXAM:
MRI OF THE RIGHT KNEE WITHOUT CONTRAST
TECHNIQUE: Multiplanar, multisequence MR imaging of the knee was performed. No
intravenous contrast was administered.

[Series 3: T2 fat-sat · axial · 4.0mm · 0.62mm/px · z∈[-31,+114]mm · 6 of 30 slices shown (1 of 2)]
[im 1/30]
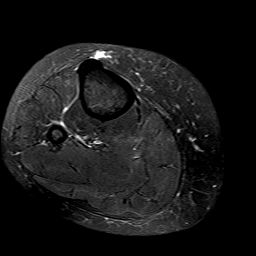
[im 6/30]
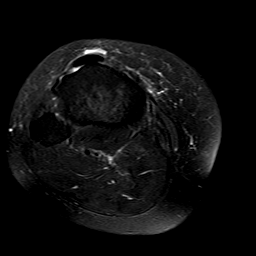
[im 12/30]
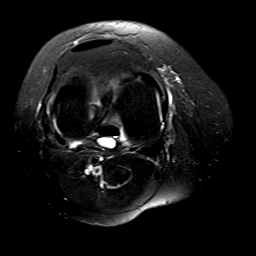
[im 18/30]
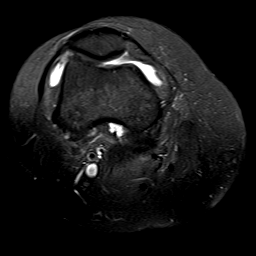
[im 24/30]
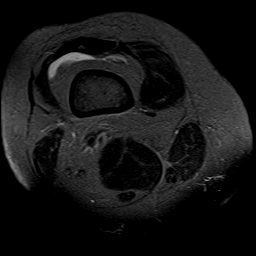
[im 30/30]
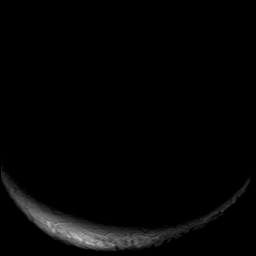

[Series 5: T2 fat-sat · coronal · 4.0mm · 0.29mm/px · 3 of 24 slices shown (2 of 2)]
[im 5/24]
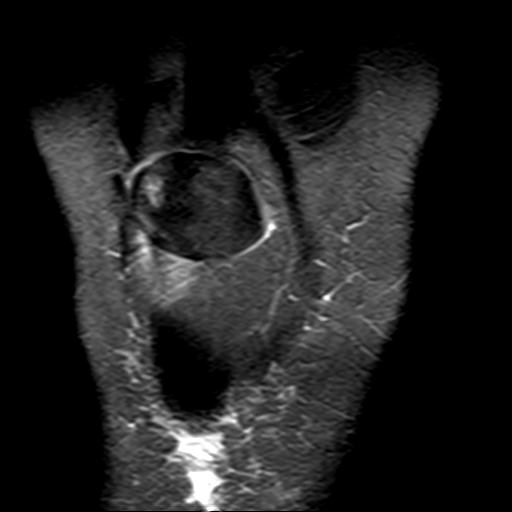
[im 14/24]
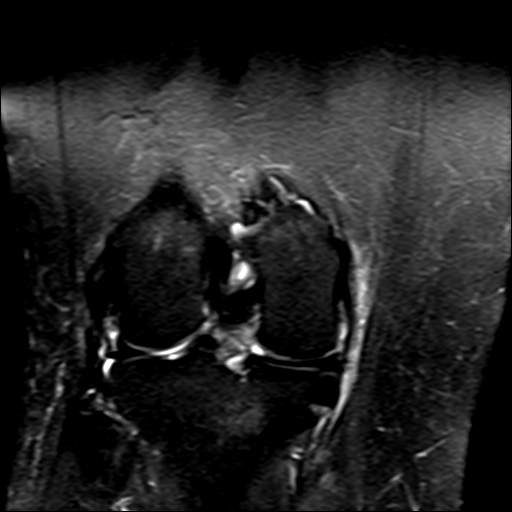
[im 24/24]
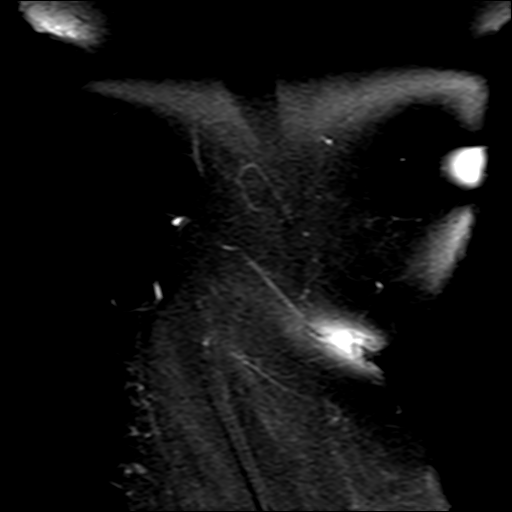

[Series 6: PD fat-sat · coronal · 4.0mm · 0.29mm/px · 6 of 24 slices shown (1 of 2)]
[im 1/24]
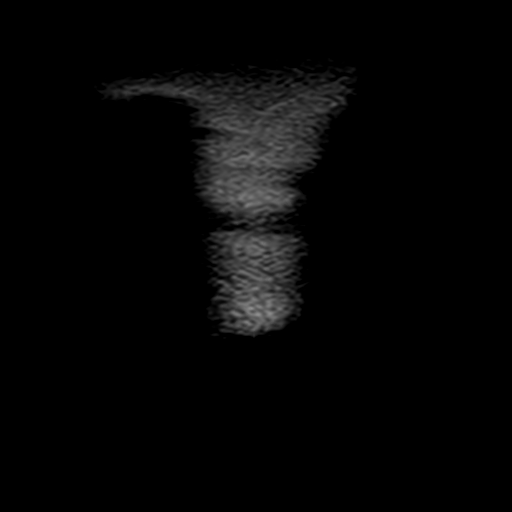
[im 5/24]
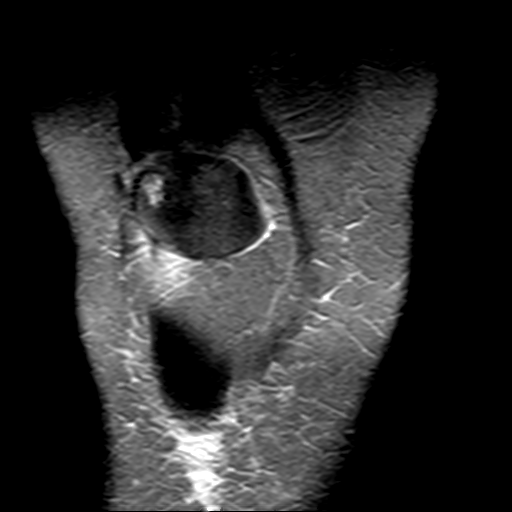
[im 10/24]
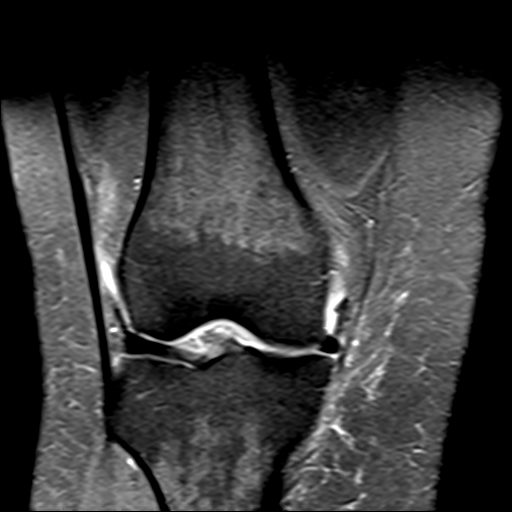
[im 14/24]
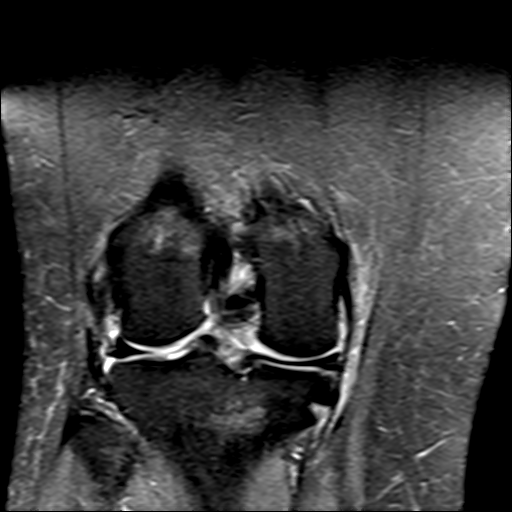
[im 19/24]
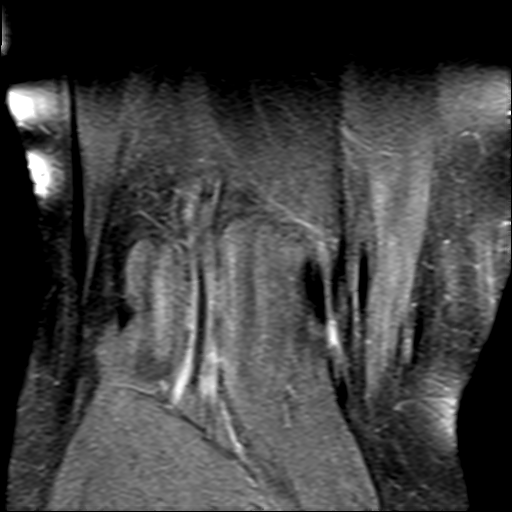
[im 24/24]
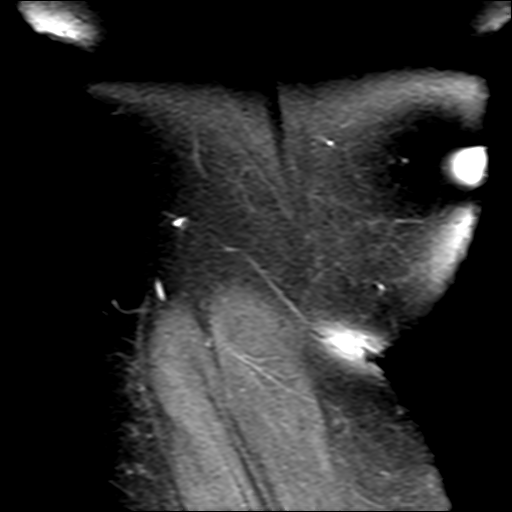

[Series 8: PD fat-sat · sagittal · 3.0mm · 0.29mm/px · 8 of 31 slices shown (2 of 2)]
[im 1/31]
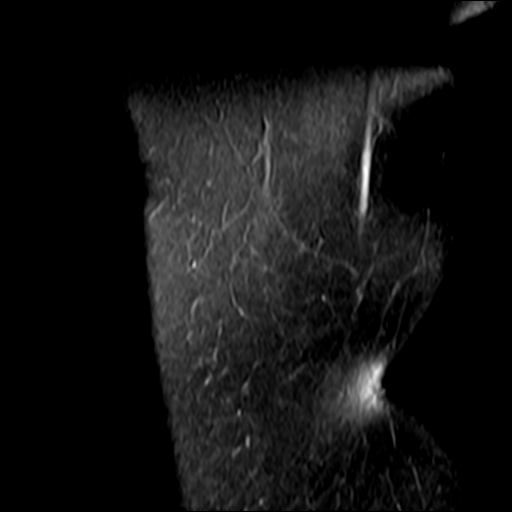
[im 5/31]
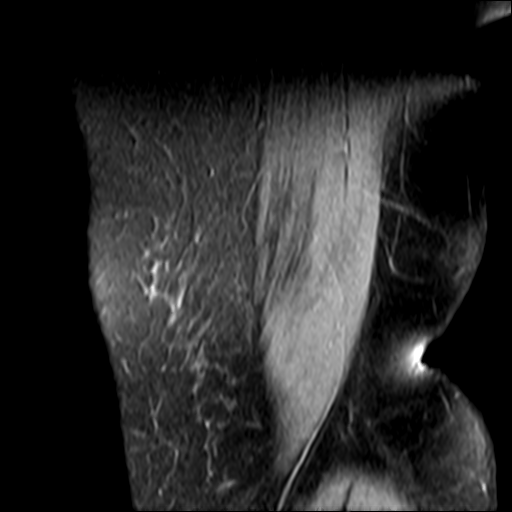
[im 9/31]
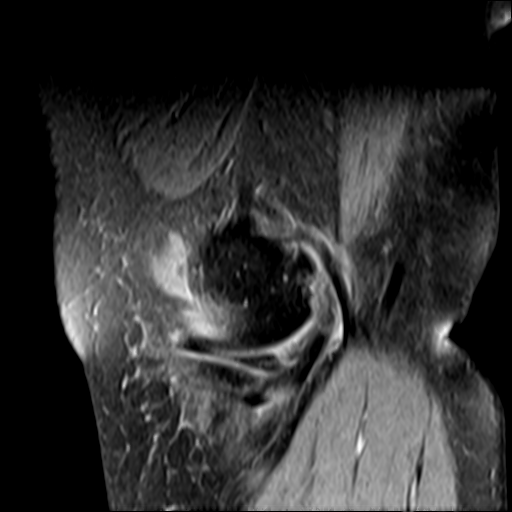
[im 13/31]
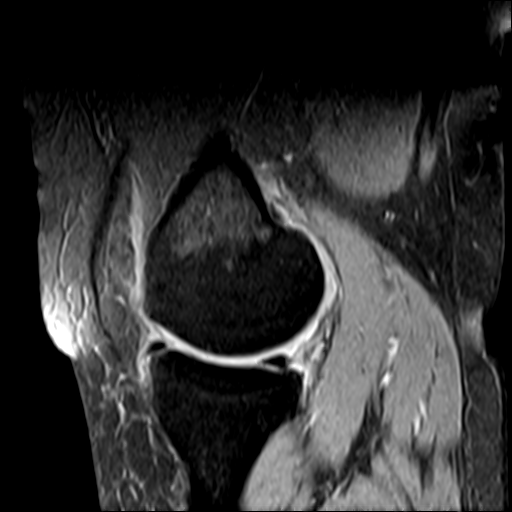
[im 18/31]
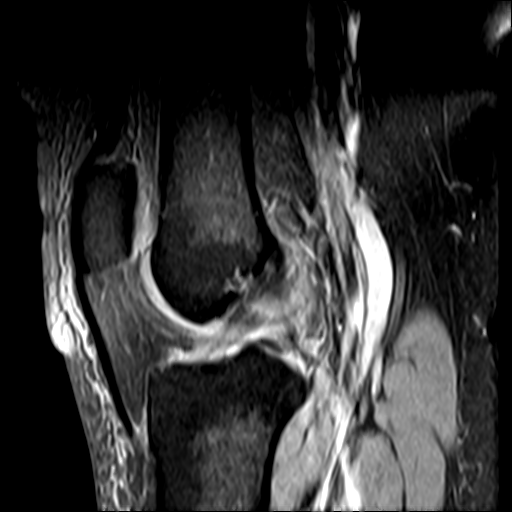
[im 22/31]
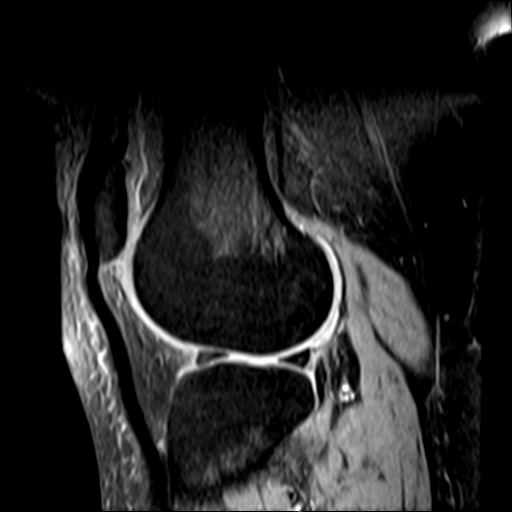
[im 26/31]
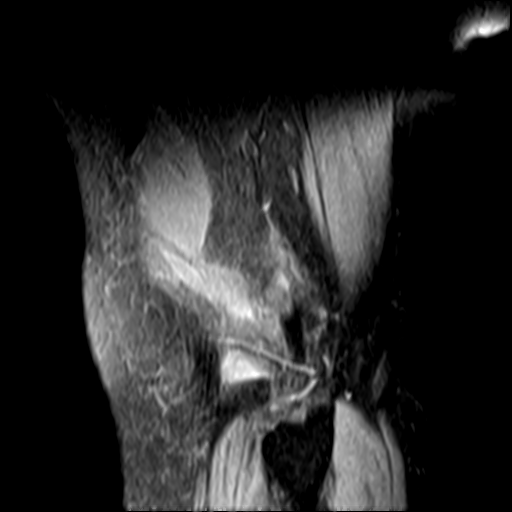
[im 31/31]
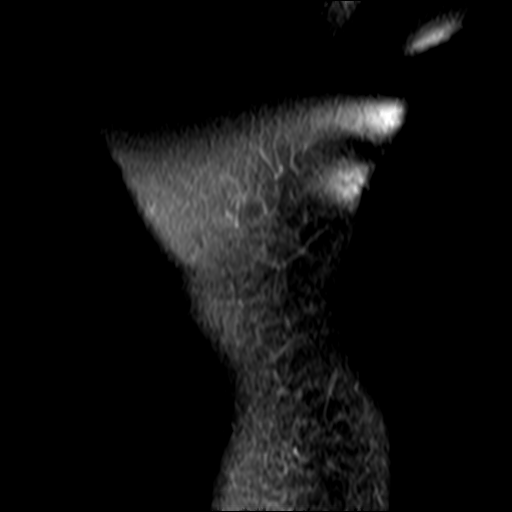

[23 of 40 positions shown; findings below may reference images not displayed]

FINDINGS: MENISCI

Medial: Tiny radial tear of the free edge of the body of medial
meniscus. Peripheral meniscal extrusion.

Lateral: Intact.

LIGAMENTS

Cruciates: ACL and PCL are intact.

Collaterals: Medial collateral ligament is intact. Lateral
collateral ligament complex is intact.

CARTILAGE

Patellofemoral: Partial-thickness cartilage loss of the lateral
patellar facet.

Medial: Mild partial-thickness cartilage loss of the medial
femorotibial compartment.

Lateral: Partial thickness cartilage loss of the lateral
femorotibial compartment with a small focal full-thickness cartilage
defect of the lateral tibial plateau.

JOINT: Small joint effusion. Normal Naziat V[place]ug[place]. No plical
thickening.

POPLITEAL FOSSA: Popliteus tendon is intact. No Baker's cyst.

EXTENSOR MECHANISM: Intact quadriceps tendon. Intact patellar
tendon. Intact lateral patellar retinaculum. Intact medial patellar
retinaculum. Intact MPFL.

BONES: No aggressive osseous lesion. No fracture or dislocation.

Other: No fluid collection or hematoma. Muscles are normal.
IMPRESSION: 1. Tiny radial tear of the free edge of the body of medial meniscus.
Peripheral meniscal extrusion.
2. Tricompartmental cartilage abnormalities as described above.

## 2021-08-08 ENCOUNTER — Encounter: Payer: Self-pay | Admitting: Surgery

## 2021-08-08 ENCOUNTER — Other Ambulatory Visit: Payer: Self-pay

## 2021-08-08 ENCOUNTER — Ambulatory Visit (INDEPENDENT_AMBULATORY_CARE_PROVIDER_SITE_OTHER): Payer: 59 | Admitting: Surgery

## 2021-08-08 VITALS — BP 132/83 | HR 90 | Ht 60.0 in | Wt 241.2 lb

## 2021-08-08 DIAGNOSIS — M545 Low back pain, unspecified: Secondary | ICD-10-CM

## 2021-08-08 DIAGNOSIS — M4316 Spondylolisthesis, lumbar region: Secondary | ICD-10-CM

## 2021-08-08 DIAGNOSIS — G8929 Other chronic pain: Secondary | ICD-10-CM

## 2021-08-08 NOTE — Progress Notes (Signed)
50 year old black female history of L4-5 stenosis/HNP, back pain and bilateral lower extremity radiculopathy comes in for preop evaluation.  States that symptoms unchanged from previous visit.  She is wanting to proceed with L4-5 fusion as scheduled.  Patient has a history of tracheostomy.   We have received preop ENT clearance and recommendations are listed below:  Telephone Encounter - Luther Parody, MD - 07/05/2021 11:10 AM EDT Formatting of this note might be different from the original. The patient is safe to undergo her orthopedic procedure but removal and replacement of her tracheostomy during the procedure will depend on the comfort level of the anesthesiologist working with her, although it should be fairly straightforward.   - She has a 4UN65R (4 cuffless trach in place) - During the procedure a cuffed endotracheal tube can be placed in the stoma as this is well healed and prior tracheostomy changes have been uncomplicated. - At the conclusion of the procedure a 4UN65R (4 cuffless trach) should be replaced.  - In the event of an emergency she can be intubated from above, otherwise be ventilated through her tracheostomy    Electronically signed by: Luther Parody, MD Resident 07/05/21 1123   Today history physical performed.  Review of systems negative.  Surgical procedure discussed and all questions were answered. I asked our surgery scheduler to see about patient having an anesthesia consult preop.

## 2021-08-09 NOTE — H&P (Addendum)
[name] is an 50 y.o. female.    Chief Complaint: Back pain and right lower extremity radiculopathy HPI: 50 year old black female history of L4-5 stenosis/HNP, back pain and bilateral lower extremity radiculopathy comes in for preop evaluation.  States that symptoms unchanged from previous visit.  She is wanting to proceed with L4-5 fusion as scheduled.  Patient has a history of tracheostomy.   We have received preop ENT clearance and recommendations are listed below:   Telephone Encounter - Luther Parody, MD - 07/05/2021 11:10 AM EDT Formatting of this note might be different from the original. The patient is safe to undergo her orthopedic procedure but removal and replacement of her tracheostomy during the procedure will depend on the comfort level of the anesthesiologist working with her, although it should be fairly straightforward.   - She has a 4UN65R (4 cuffless trach in place) - During the procedure a cuffed endotracheal tube can be placed in the stoma as this is well healed and prior tracheostomy changes have been uncomplicated. - At the conclusion of the procedure a 4UN65R (4 cuffless trach) should be replaced.  - In the event of an emergency she can be intubated from above, otherwise be ventilated through her tracheostomy      Today history physical performed.  Review of systems negative.   Past Medical History:  Diagnosis Date   Allergy    Anxiety    Arthritis    Asthma    Chronic back pain    Chronic chest pain    resolved, no problems since 2019 per patient 07/27/19   COPD (chronic obstructive pulmonary disease) ([place])    Depression    DM (diabetes mellitus) ([place])    INSULIN DEPENDENT - TYPE 1   GERD (gastroesophageal reflux disease)    Headache(784.0)    Hyperlipidemia    no med, diet controlled   Hypertension    Hypokalemia    Respiratory disease 05/2017   uses inhalers, neb tx prn, no oxygen   Sleep apnea    does not use CPAP due to  trach   Tracheostomy in place [facility]) 02/2017    Past Surgical History:  Procedure Laterality Date   ABDOMINAL HYSTERECTOMY  2009   APPENDECTOMY     CESAREAN SECTION     x 3   CHOLECYSTECTOMY N/A 03/02/2014   Procedure: LAPAROSCOPIC CHOLECYSTECTOMY;  Surgeon: Joyice Faster. Cornett, MD;  Location: [place];  Service: General;  Laterality: N/A;   HERNIA REPAIR     PANENDOSCOPY N/A 03/04/2017   Procedure: PANENDOSCOPY WITH POSSIBLE FOREIGN BODY REMOVAL;  Surgeon: Jodi Marble, MD;  Location: [place];  Service: ENT;  Laterality: N/A;   ROTATOR CUFF REPAIR Left    ROTATOR CUFF REPAIR Right 07/27/2019   SHOULDER ARTHROSCOPY WITH SUBACROMIAL DECOMPRESSION, ROTATOR CUFF REPAIR AND BICEP TENDON REPAIR Right 07/28/2019   Procedure: RIGHT SHOULDER ARTHROSCOP, MINI OPEN ROTATOR CUFF TEAR REPAIR,  BICEPS TENODESIS, DISTAL CLAVICLE EXCISION;  Surgeon: Meredith Pel, MD;  Location: [place];  Service: Orthopedics;  Laterality: Right;   TOOTH EXTRACTION N/A 02/22/2021   Procedure: DENTAL RESTORATION/EXTRACTIONS;  Surgeon: Diona Browner, DMD;  [facility];  Service: Oral Surgery;  Laterality: N/A;   TRACHEOSTOMY  02/2017   TUBAL LIGATION     VESICOVAGINAL FISTULA CLOSURE W/ TAH  2009    Family History  Problem Relation Age of Onset   Heart attack Mother    Stroke Mother    Diabetes Mother    Hypertension Mother  Arthritis Mother    Stroke Father    Asthma Sister    Hypertension Sister    Asthma Sister    Diabetes Sister    Asthma Brother    Seizures Brother    Asthma Brother    Diabetes Brother    Asthma Daughter    Asthma Son    Asthma Grandson    Colon cancer Neg Hx    Rectal cancer Neg Hx    Stomach cancer Neg Hx    Social History:  reports that she quit smoking about 5 years ago. Her smoking use included cigarettes. She has a 12.50 pack-year smoking history. She has never used smokeless tobacco. She reports current alcohol use. She reports current drug use. Drug:  Marijuana.  Allergies:  Allergies  Allergen Reactions   Reglan [Metoclopramide] Other (See Comments)    Panic attack    No medications prior to admission.    No results found for this or any previous visit (from the past 48 hour(s)). No results found.  Review of Systems  Constitutional:  Positive for activity change.  HENT: Negative.    Respiratory:         Tracheotomy  Cardiovascular: Negative.   Gastrointestinal: Negative.   Genitourinary: Negative.   Musculoskeletal:  Positive for back pain and gait problem.   There were no vitals taken for this visit. Physical Exam HENT:     Head: Normocephalic and atraumatic.     Nose: Nose normal.  Eyes:     Extraocular Movements: Extraocular movements intact.  Neck:     Comments: Patient has tracheotomy Cardiovascular:     Heart sounds: Normal heart sounds.  Pulmonary:     Effort: No respiratory distress.     Breath sounds: Normal breath sounds.  Musculoskeletal:        General: Tenderness present.  Neurological:     Mental Status: She is alert and oriented to person, place, and time.  Psychiatric:        Mood and Affect: Mood normal.     Assessment/Plan L4-5 HNP/stenosis       We will proceed with L4-5 fusion as scheduled.  Surgical procedure discussed and all questions were answered. I asked our surgery scheduler to see about patient having an anesthesia consult preop.  Benjiman Core, PA-C 08/09/2021, 4:06 PM

## 2021-08-12 NOTE — Progress Notes (Signed)
Surgical Instructions    Your procedure is scheduled on Tuesday, November 29th, 2022.   Report to [facility] Main Entrance "A" at 05:30 A.M., then check in with the Admitting office.  Call this number if you have problems the morning of surgery:  [phone]   If you have any questions prior to your surgery date call [phone]: Open Monday-Friday 8am-4pm    Remember:  Do not eat after midnight the night before your surgery  You may drink clear liquids until 04:30 the morning of your surgery.   Clear liquids allowed are: Water, Non-Citrus Juices (without pulp), Carbonated Beverages, Clear Tea, Black Coffee ONLY (NO MILK, CREAM OR POWDERED CREAMER of any kind), and Gatorade  Patient Instructions  The day of surgery (if you have diabetes):  Drink ONE (1) 12 oz G2 given to you in your pre admission testing appointment by 04:30 the morning of surgery. Drink in one sitting. Do not sip.  This drink was given to you during your hospital  pre-op appointment visit.  Nothing else to drink after completing the  12 oz bottle of G2.         If you have questions, please contact your surgeon's office.     Take these medicines the morning of surgery with A SIP OF WATER:  atorvastatin (LIPITOR) buPROPion (WELLBUTRIN SR)  gabapentin (NEURONTIN)  mometasone-formoterol (DULERA) - please, bring the inhaler with you the day of surgery oxyCODONE-acetaminophen (PERCOCET) pantoprazole (PROTONIX)  topiramate (TOPAMAX)  If needed:   albuterol (PROAIR HFA) - please, bring the inhaler with you the day of surgery albuterol (PROVENTIL)  hydrOXYzine (ATARAX/VISTARIL) methocarbamol (ROBAXIN)  ondansetron (ZOFRAN)  SUMAtriptan (IMITREX) tiZANidine (ZANAFLEX)  As of today, STOP taking any Aspirin (unless otherwise instructed by your surgeon) Aleve, Naproxen, Ibuprofen, Motrin, Advil, Goody's, BC's, all herbal medications, fish oil, and all vitamins.   WHAT DO I DO ABOUT MY DIABETES  MEDICATION?   Do not take dapagliflozin propanediol (FARXIGA) the day before surgery and the day of surgery  Do not take Semaglutide the day of surgery  THE NIGHT BEFORE SURGERY, take 77 units of insulin degludec (TRESIBA FLEXTOUCH) - 50% of your usually dose.       HOW TO MANAGE YOUR DIABETES BEFORE AND AFTER SURGERY  Why is it important to control my blood sugar before and after surgery? Improving blood sugar levels before and after surgery helps healing and can limit problems. A way of improving blood sugar control is eating a healthy diet by:  Eating less sugar and carbohydrates  Increasing activity/exercise  Talking with your doctor about reaching your blood sugar goals High blood sugars (greater than 180 mg/dL) can raise your risk of infections and slow your recovery, so you will need to focus on controlling your diabetes during the weeks before surgery. Make sure that the doctor who takes care of your diabetes knows about your planned surgery including the date and location.  How do I manage my blood sugar before surgery? Check your blood sugar at least 4 times a day, starting 2 days before surgery, to make sure that the level is not too high or low.  Check your blood sugar the morning of your surgery when you wake up and every 2 hours until you get to the Short Stay unit.  If your blood sugar is less than 70 mg/dL, you will need to treat for low blood sugar: Do not take insulin. Treat a low blood sugar (less than 70 mg/dL) with  cup of  clear juice (cranberry or apple), 4 glucose tablets, OR glucose gel. Recheck blood sugar in 15 minutes after treatment (to make sure it is greater than 70 mg/dL). If your blood sugar is not greater than 70 mg/dL on recheck, call [phone] for further instructions. Report your blood sugar to the short stay nurse when you get to Short Stay.  If you are admitted to the hospital after surgery: Your blood sugar will be checked by the staff and  you will probably be given insulin after surgery (instead of oral diabetes medicines) to make sure you have good blood sugar levels. The goal for blood sugar control after surgery is 80-180 mg/dL.    After your COVID test   You are not required to quarantine however you are required to wear a well-fitting mask when you are out and around people not in your household.  If your mask becomes wet or soiled, replace with a new one.  Wash your hands often with soap and water for 20 seconds or clean your hands with an alcohol-based hand sanitizer that contains at least 60% alcohol.  Do not share personal items.  Notify your provider: if you are in close contact with someone who has COVID  or if you develop a fever of 100.4 or greater, sneezing, cough, sore throat, shortness of breath or body aches.    The day of surgery:          Do not wear jewelry or makeup Do not wear lotions, powders, perfumes, or deodorant. Do not shave 48 hours prior to surgery.   Do not bring valuables to the hospital. DO Not wear nail polish, gel polish, artificial nails, or any other type of covering on natural nails including finger and toenails. If patients have artificial nails, gel coating, etc. that need to be removed by a nail salon, please have this removed prior to surgery or surgery may need to be canceled/delayed if the surgeon/ anesthesia feels like the patient is unable to be adequately monitored.              [facility] is not responsible for any belongings or valuables.  Do NOT Smoke (Tobacco/Vaping)  24 hours prior to your procedure  If you use a CPAP at night, you may bring your mask for your overnight stay.   Contacts, glasses, hearing aids, dentures or partials may not be worn into surgery, please bring cases for these belongings   For patients admitted to the hospital, discharge time will be determined by your treatment team.   Patients discharged the day of surgery will not be allowed to  drive home, and someone needs to stay with them for 24 hours.  NO VISITORS WILL BE ALLOWED IN PRE-OP WHERE PATIENTS ARE PREPPED FOR SURGERY.  ONLY 1 SUPPORT PERSON MAY BE PRESENT IN THE WAITING ROOM WHILE YOU ARE IN SURGERY.  IF YOU ARE TO BE ADMITTED, ONCE YOU ARE IN YOUR ROOM YOU WILL BE ALLOWED TWO (2) VISITORS. 1 (ONE) VISITOR MAY STAY OVERNIGHT BUT MUST ARRIVE TO THE ROOM BY 8pm.  Minor children may have two parents present. Special consideration for safety and communication needs will be reviewed on a case by case basis.  Special instructions:    Oral Hygiene is also important to reduce your risk of infection.  Remember - BRUSH YOUR TEETH THE MORNING OF SURGERY WITH YOUR REGULAR TOOTHPASTE   [facility]- Preparing For Surgery  Before surgery, you can play an important role. Because skin is  not sterile, your skin needs to be as free of germs as possible. You can reduce the number of germs on your skin by washing with CHG (chlorahexidine gluconate) Soap before surgery.  CHG is an antiseptic cleaner which kills germs and bonds with the skin to continue killing germs even after washing.     Please do not use if you have an allergy to CHG or antibacterial soaps. If your skin becomes reddened/irritated stop using the CHG.  Do not shave (including legs and underarms) for at least 48 hours prior to first CHG shower. It is OK to shave your face.  Please follow these instructions carefully.     Shower the NIGHT BEFORE SURGERY and the MORNING OF SURGERY with CHG Soap.   If you chose to wash your hair, wash your hair first as usual with your normal shampoo. After you shampoo, rinse your hair and body thoroughly to remove the shampoo.  Then [employer] and genitals (private parts) with your normal soap and rinse thoroughly to remove soap.  After that Use CHG Soap as you would any other liquid soap. You can apply CHG directly to the skin and wash gently with a scrungie or a clean washcloth.   Apply  the CHG Soap to your body ONLY FROM THE NECK DOWN.  Do not use on open wounds or open sores. Avoid contact with your eyes, ears, mouth and genitals (private parts). Wash Face and genitals (private parts)  with your normal soap.   Wash thoroughly, paying special attention to the area where your surgery will be performed.  Thoroughly rinse your body with warm water from the neck down.  DO NOT shower/wash with your normal soap after using and rinsing off the CHG Soap.  Pat yourself dry with a CLEAN TOWEL.  Wear CLEAN PAJAMAS to bed the night before surgery  Place CLEAN SHEETS on your bed the night before your surgery  DO NOT SLEEP WITH PETS.   Day of Surgery:  Take a shower with CHG soap. Wear Clean/Comfortable clothing the morning of surgery Do not apply any deodorants/lotions.   Remember to brush your teeth WITH YOUR REGULAR TOOTHPASTE.   Please read over the following fact sheets that you were given.

## 2021-08-13 ENCOUNTER — Other Ambulatory Visit: Payer: Self-pay

## 2021-08-13 ENCOUNTER — Encounter (HOSPITAL_COMMUNITY)
Admission: RE | Admit: 2021-08-13 | Discharge: 2021-08-13 | Disposition: A | Discharge status: Home / Self Care | Payer: 59 | Source: Ambulatory Visit | Attending: Specialist | Admitting: Specialist

## 2021-08-13 ENCOUNTER — Encounter (HOSPITAL_COMMUNITY): Payer: Self-pay

## 2021-08-13 VITALS — BP 125/93 | HR 95 | Temp 98.6°F | Resp 18 | Ht 61.0 in | Wt 240.3 lb

## 2021-08-13 DIAGNOSIS — Z794 Long term (current) use of insulin: Secondary | ICD-10-CM | POA: Insufficient documentation

## 2021-08-13 DIAGNOSIS — E785 Hyperlipidemia, unspecified: Secondary | ICD-10-CM | POA: Diagnosis not present

## 2021-08-13 DIAGNOSIS — G4733 Obstructive sleep apnea (adult) (pediatric): Secondary | ICD-10-CM | POA: Diagnosis not present

## 2021-08-13 DIAGNOSIS — Z01818 Encounter for other preprocedural examination: Secondary | ICD-10-CM | POA: Diagnosis present

## 2021-08-13 DIAGNOSIS — E669 Obesity, unspecified: Secondary | ICD-10-CM | POA: Diagnosis not present

## 2021-08-13 DIAGNOSIS — J398 Other specified diseases of upper respiratory tract: Secondary | ICD-10-CM | POA: Insufficient documentation

## 2021-08-13 DIAGNOSIS — I10 Essential (primary) hypertension: Secondary | ICD-10-CM | POA: Insufficient documentation

## 2021-08-13 DIAGNOSIS — Z87891 Personal history of nicotine dependence: Secondary | ICD-10-CM | POA: Insufficient documentation

## 2021-08-13 DIAGNOSIS — E119 Type 2 diabetes mellitus without complications: Secondary | ICD-10-CM | POA: Insufficient documentation

## 2021-08-13 DIAGNOSIS — J45909 Unspecified asthma, uncomplicated: Secondary | ICD-10-CM | POA: Insufficient documentation

## 2021-08-13 DIAGNOSIS — K219 Gastro-esophageal reflux disease without esophagitis: Secondary | ICD-10-CM | POA: Insufficient documentation

## 2021-08-13 DIAGNOSIS — G8929 Other chronic pain: Secondary | ICD-10-CM | POA: Diagnosis not present

## 2021-08-13 DIAGNOSIS — J449 Chronic obstructive pulmonary disease, unspecified: Secondary | ICD-10-CM | POA: Diagnosis not present

## 2021-08-13 HISTORY — DX: Personal history of other diseases of the digestive system: Z87.19

## 2021-08-13 HISTORY — DX: Dyspnea, unspecified: R06.00

## 2021-08-13 HISTORY — DX: Pneumonia, unspecified organism: J18.9

## 2021-08-13 LAB — URINALYSIS, ROUTINE W REFLEX MICROSCOPIC
Bilirubin Urine: NEGATIVE
Glucose, UA: NEGATIVE mg/dL
Hgb urine dipstick: NEGATIVE
Ketones, ur: NEGATIVE mg/dL
Leukocytes,Ua: NEGATIVE
Nitrite: NEGATIVE
Protein, ur: NEGATIVE mg/dL
Specific Gravity, Urine: 1.023 (ref 1.005–1.030)
pH: 5 (ref 5.0–8.0)

## 2021-08-13 LAB — TYPE AND SCREEN
ABO/RH(D): B POS
Antibody Screen: NEGATIVE

## 2021-08-13 LAB — PROTIME-INR
INR: 1 (ref 0.8–1.2)
Prothrombin Time: 13.3 seconds (ref 11.4–15.2)

## 2021-08-13 LAB — COMPREHENSIVE METABOLIC PANEL
ALT: 17 U/L (ref 0–44)
AST: 13 U/L — ABNORMAL LOW (ref 15–41)
Albumin: 3.6 g/dL (ref 3.5–5.0)
Alkaline Phosphatase: 89 U/L (ref 38–126)
Anion gap: 8 (ref 5–15)
BUN: 18 mg/dL (ref 6–20)
CO2: 26 mmol/L (ref 22–32)
Calcium: 9 mg/dL (ref 8.9–10.3)
Chloride: 104 mmol/L (ref 98–111)
Creatinine, Ser: 1.37 mg/dL — ABNORMAL HIGH (ref 0.44–1.00)
GFR, Estimated: 47 mL/min — ABNORMAL LOW (ref >60)
Glucose, Bld: 167 mg/dL — ABNORMAL HIGH (ref 70–99)
Potassium: 4 mmol/L (ref 3.5–5.1)
Sodium: 138 mmol/L (ref 135–145)
Total Bilirubin: 0.6 mg/dL (ref 0.3–1.2)
Total Protein: 7.4 g/dL (ref 6.5–8.1)

## 2021-08-13 LAB — CBC
HCT: 37.6 % (ref 36.0–46.0)
Hemoglobin: 12.2 g/dL (ref 12.0–15.0)
MCH: 28.2 pg (ref 26.0–34.0)
MCHC: 32.4 g/dL (ref 30.0–36.0)
MCV: 87 fL (ref 80.0–100.0)
Platelets: 403 10*3/uL — ABNORMAL HIGH (ref 150–400)
RBC: 4.32 MIL/uL (ref 3.87–5.11)
RDW: 13.8 % (ref 11.5–15.5)
WBC: 8.6 10*3/uL (ref 4.0–10.5)
nRBC: 0 % (ref 0.0–0.2)

## 2021-08-13 LAB — SURGICAL PCR SCREEN
MRSA, PCR: NEGATIVE
Staphylococcus aureus: NEGATIVE

## 2021-08-13 LAB — GLUCOSE, CAPILLARY: Glucose-Capillary: 178 mg/dL — ABNORMAL HIGH (ref 70–99)

## 2021-08-13 NOTE — Progress Notes (Signed)
PCP - [name] Cardiologist -  Pulmonology: Velora Heckler Pulmonology Shearon Stalls) ENT: Luther Parody  PPM/ICD - denies   Chest x-ray - n/a EKG - 08/13/21 Stress Test - over 10 years ago, negative in care everywhere ECHO - 06/04/17 Cardiac Cath - denies  Sleep Study - yes, wears humidified oxygen at night   Fasting Blood Sugar - 100-150 Checks Blood Sugar twice a day  As of today, STOP taking any Aspirin (unless otherwise instructed by your surgeon) Aleve, Naproxen, Ibuprofen, Motrin, Advil, Goody's, BC's, all herbal medications, fish oil, and all vitamins.  ERAS Protcol -yes PRE-SURGERY Ensure or G2- g2 ordered and give  COVID TEST- scheduled for 08/19/21   Anesthesia review: yes, trach in place. ENT and pulmonology clearance in Epic. Dr Roanna Banning and Jeneen Rinks burns saw patient in office during APT appt  Patient denies shortness of breath, fever, cough and chest pain at PAT appointment   All instructions explained to the patient, with a verbal understanding of the material. Patient agrees to go over the instructions while at home for a better understanding. Patient also instructed to self quarantine after being tested for COVID-19. The opportunity to ask questions was provided.

## 2021-08-14 NOTE — Progress Notes (Signed)
Anesthesia Chart Review:  History includes former smoker (quit 01/2017), tracheostomy (03/08/17 following glottic intubation injury and subsequent development of tracheal stenosis; seen Q 3 months at [facility] for management/trach exchange) , Severe OSA (no CPAP since trach, uses 2L supplemental O2 QHS), HTN, asthma, GERD, chronic pain, DM (insulin dependent), HLD, COPD, obesity.   Hx of severe OSA and asthma, not well controlled. Currently maintained on Dulera, Singulair, as needed duoneb. Follows with pulmonology. Last seen 06/20/21 for preop eval. Per note, "Preop pulmonary risk assessment.  Patient is independent lives at home.  Able to do light household chores.  Asthma appears to be stable without recent exacerbation.  Patient does have a chronic trach which appears to be stable.  She is also on oxygen at bedtime via trach collar at 2 L / 40% FiO2.- Patient is at a mild to moderate pulmonary surgical risk ."   Insulin-dependent diabetes.  PCP notes document this as both type 1 and type 2 diabetes in various places.  However, endocrinology notes state this is type 1 diabetes.  Last A1c 7.2 on 07/26/2021.  She was last seen by ENT at [facility] on 08/06/21. Per note, "She returns in regards to her tracheostomy. Her only concern is that the new design she feels the tracheostomy is too prominent. She also does not like having to take out inner cannula to finger plug. Otherwise she has no trouble ventilating. Unfortunately, her daughter is currently in the ICU after being shot in the spine.". No pneumonia or swallowing troubles."    Documentation from replacement 08/06/21: Procedures:  1) Tracheostomy Change 08/06/21 FINDINGS:  Mature stoma, stomal collapses upon removal of trach - as last time. Patient endorses being nervous .  4UN65R with lubrication placed without difficultly  Small amount of redundant tissue within superior tracheal stoma which occludes her stoma on expiration, although stoma is well  healed and tracheostomy is placed easily  PROCEDURE DETAILS: The patient was positioned semirecumbent. The velcro trach ties were released. The indwelling 4UN65R CFS trach was removed and replaced with a new 4UN65R. The new trach slid in easily. A new velcro trach tie was secured in place and confirmed to be snug. The patient tolerated the procedure well and without complications.    Preop labs reviewed, creatinine mildly elevated 1.37, otherwise unremarkable.   Dr. Roanna Banning evaluated patient at preadmission testing appointment, recommended use of cuffed ET tube.   EKG 09/20/20: NSR. Rate 78.   CT chest abdomen pelvis 04/01/2021: IMPRESSION: No acute intrathoracic, intra-abdominal or intrapelvic abnormalities.   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   Fatty infiltration of liver.   Decreased size of LEFT ovary since previous exam.   Aneurysmal dilatation ascending thoracic aorta 4.1 cm diameter, previously 3.8 cm, recommendation below.   Recommend annual imaging followup by CTA or MRA. This recommendation follows 2010 ACCF/AHA/AATS/ACR/ASA/SCA/SCAI/SIR/STS/SVM Guidelines for the Diagnosis and Management of Patients with Thoracic Aortic Disease. Circulation. 2010; 121: Z329-J242. Aortic aneurysm NOS (ICD10-I71.9) .  Echo 06/04/17: Study Conclusions - Left ventricle: The cavity size was normal. Wall thickness was   normal. Systolic function was normal. The estimated ejection   fraction was in the range of 60% to 65%. Wall motion was normal;   there were no regional wall motion abnormalities. Doppler   parameters are consistent with abnormal left ventricular   relaxation (grade 1 diastolic dysfunction). - Mitral valve: Valve area by pressure half-time: 0.72 cm^2.    Karoline Caldwell, PA-C [facility] Short Stay Center/Anesthesiology Phone [phone])  [phone] 08/14/2021 9:34 AM

## 2021-08-14 NOTE — Anesthesia Preprocedure Evaluation (Addendum)
Anesthesia Evaluation  Patient identified by MRN, date of birth, ID band Patient awake    Reviewed: Allergy & Precautions, NPO status , Patient's Chart, lab work & pertinent test results  Airway Mallampati: Trach       Dental   Pulmonary asthma , sleep apnea and Oxygen sleep apnea , COPD, Patient abstained from smoking., former smoker,  Tracheostomy   Pulmonary exam normal        Cardiovascular hypertension, Pt. on medications Normal cardiovascular exam  ECG: NSR, rate 87   Neuro/Psych  Headaches, PSYCHIATRIC DISORDERS Anxiety Depression    GI/Hepatic Neg liver ROS, hiatal hernia, GERD  Medicated and Controlled,  Endo/Other  diabetesMorbid obesity  Renal/GU negative Renal ROS     Musculoskeletal negative musculoskeletal ROS (+)   Abdominal (+) + obese,   Peds  Hematology negative hematology ROS (+)   Anesthesia Other Findings L4-5 central disc herniation with radiculopathy, degenerative disc disease, Grade I spondylolisthesis  Reproductive/Obstetrics                           Anesthesia Physical Anesthesia Plan  ASA: 4  Anesthesia Plan: General   Post-op Pain Management:    Induction: Intravenous  PONV Risk Score and Plan: 3 and Ondansetron, Dexamethasone, Treatment may vary due to age or medical condition and Midazolam  Airway Management Planned: Tracheostomy  Additional Equipment:   Intra-op Plan:   Post-operative Plan: Extubation in OR  Informed Consent: I have reviewed the patients History and Physical, chart, labs and discussed the procedure including the risks, benefits and alternatives for the proposed anesthesia with the patient or authorized representative who has indicated his/her understanding and acceptance.     Dental advisory given  Plan Discussed with: CRNA  Anesthesia Plan Comments: (PAT note by Karoline Caldwell, PA-C:  History includes former smoker (quit  01/2017), tracheostomy (03/08/17 following glottic intubation injury and subsequent development of tracheal stenosis; seen Q 3 months at [facility] for management/trach exchange) , Severe OSA (no CPAP since trach, uses 2L supplemental O2 QHS), HTN, asthma, GERD, chronic pain, DM (insulin dependent), HLD, COPD, obesity.  Hx of severe OSA and asthma, not well controlled. Currently maintained on Dulera, Singulair, as needed duoneb. Follows with pulmonology. Last seen 06/20/21 for preop eval. Per note, "Preop pulmonary risk assessment. Patient is independent lives at home. Able to do light household chores. Asthma appears to be stable without recent exacerbation. Patient does have a chronic trach which appears to be stable. She is also on oxygen at bedtime via trach collar at 2 L / 40% FiO2.- Patient is at a mild to moderate pulmonarysurgical risk ."  Insulin-dependent diabetes.  PCP notes document this as both type 1 and type 2 diabetes in various places.  However, endocrinology notes state this is type 1 diabetes.  Last A1c 7.2 on 07/26/2021.  She was last seen by ENT at [facility] on 08/06/21. Per note, "She returns in regards to her tracheostomy. Her only concern is that the new design she feels the tracheostomy is too prominent. She also does not like having to take out inner cannula to finger plug. Otherwise she has no trouble ventilating. Unfortunately, her daughter is currently in the ICU after being shot in the spine.". No pneumonia or swallowing troubles."   Documentation from replacement 08/06/21: Procedures:  1) Tracheostomy Change 08/06/21 FINDINGS:  Mature stoma, stomal collapses upon removal of trach - as last time. Patient endorses being nervous .  [id number] with lubrication  placed without difficultly  Small amount of redundant tissue within superior tracheal stoma which occludes her stoma on expiration, although stoma is well healed and tracheostomy is placed easily  PROCEDURE DETAILS: The  patient was positioned semirecumbent. The velcro trach ties were released. The indwelling 4UN65R CFS trach was removed and replaced with a new 4UN65R. The new trach slid in easily. A new velcro trach tie was secured in place and confirmed to be snug. The patient tolerated the procedure well and without complications.    Preop labs reviewed, creatinine mildly elevated 1.37, otherwise unremarkable.  Dr. Roanna Banning evaluated patient at preadmission testing appointment, recommended use of cuffed ET tube.  EKG 09/20/20:NSR. Rate 78.  CT chest abdomen pelvis 04/01/2021: IMPRESSION: No acute intrathoracic, intra-abdominal or intrapelvic abnormalities.  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  Fatty infiltration of liver.  Decreased size of LEFT ovary since previous exam.  Aneurysmal dilatation ascending thoracic aorta 4.1 cm diameter, previously 3.8 cm, recommendation below.  Recommend annual imaging followup by CTA or MRA. This recommendation follows 2010 ACCF/AHA/AATS/ACR/ASA/SCA/SCAI/SIR/STS/SVM Guidelines for the Diagnosis and Management of Patients with Thoracic Aortic Disease. Circulation. 2010; 121: K998-P382. Aortic aneurysm NOS (ICD10-I71.9) .  Echo 06/04/17: Study Conclusions - Left ventricle: The cavity size was normal. Wall thickness was normal. Systolic function was normal. The estimated ejection fraction was in the range of 60% to 65%. Wall motion was normal; there were no regional wall motion abnormalities. Doppler parameters are consistent with abnormal left ventricular relaxation (grade 1 diastolic dysfunction). - Mitral valve: Valve area by pressure half-time: 0.72 cm^2.   )      Anesthesia Quick Evaluation

## 2021-08-15 IMAGING — NM NM GASTRIC EMPTYING
8 series · 8 of 8 positions shown · non-contrast
Comparison: CT August 10, 2020

CLINICAL DATA: Abdominal pain nausea and vomiting

EXAM:
NUCLEAR MEDICINE GASTRIC EMPTYING SCAN
TECHNIQUE: After oral ingestion of radiolabeled meal, sequential abdominal
images were obtained for 3 hours. Percentage of activity emptying
the stomach was calculated at 1 hour, 2 hour, and 3 hours.
RADIOPHARMACEUTICALS:  2.1 mCi 9c-EEm sulfur colloid in standardized
meal

[Series 1: 0 min · 4.14mm/px · 1 of 1 slices shown (1 of 2)]
[im 1/1]
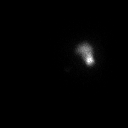

[Series 1: 0 min · 4.14mm/px · 1 of 1 slices shown (2 of 2)]
[im 1/1]
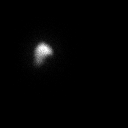

[Series 2: 1 hr · 4.14mm/px · 1 of 1 slices shown (1 of 2)]
[im 1/1]
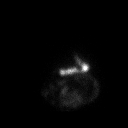

[Series 2: 1 hr · 4.14mm/px · 1 of 1 slices shown (2 of 2)]
[im 1/1]
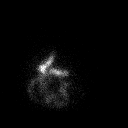

[Series 3: 2 hr · 4.14mm/px · 1 of 1 slices shown (1 of 2)]
[im 1/1]
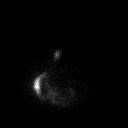

[Series 3: 2 hr · 4.14mm/px · 1 of 1 slices shown (2 of 2)]
[im 1/1]
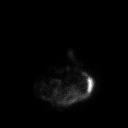

[Series 4: 90 min · 4.14mm/px · 1 of 1 slices shown (1 of 2)]
[im 1/1  full-range]
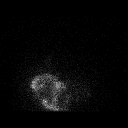

[Series 4: 90 min · 4.14mm/px · 1 of 1 slices shown (2 of 2)]
[im 1/1]
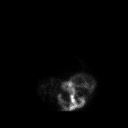

[8 of 8 positions shown; findings below may reference images not displayed]

FINDINGS: Expected location of the stomach in the left upper quadrant.
Ingested meal empties the stomach gradually over the course of the
study.

54% emptied at 1 hr ( normal >= 10%)

89% emptied at 2 hr ( normal >= 40%)

96% emptied at 3 hr ( normal >= 70%)
IMPRESSION: Normal gastric emptying study.

## 2021-08-19 ENCOUNTER — Other Ambulatory Visit (HOSPITAL_COMMUNITY)
Admission: RE | Admit: 2021-08-19 | Discharge: 2021-08-19 | Disposition: A | Discharge status: Home / Self Care | Payer: 59 | Source: Ambulatory Visit | Attending: Specialist | Admitting: Specialist

## 2021-08-19 DIAGNOSIS — Z01812 Encounter for preprocedural laboratory examination: Secondary | ICD-10-CM | POA: Diagnosis present

## 2021-08-19 DIAGNOSIS — Z20822 Contact with and (suspected) exposure to covid-19: Secondary | ICD-10-CM | POA: Diagnosis not present

## 2021-08-19 DIAGNOSIS — Z01818 Encounter for other preprocedural examination: Secondary | ICD-10-CM

## 2021-08-19 LAB — SARS CORONAVIRUS 2 (TAT 6-24 HRS): SARS Coronavirus 2: NEGATIVE

## 2021-08-20 ENCOUNTER — Ambulatory Visit (HOSPITAL_COMMUNITY): Payer: 59

## 2021-08-20 ENCOUNTER — Encounter (HOSPITAL_COMMUNITY): Payer: Self-pay | Admitting: Specialist

## 2021-08-20 ENCOUNTER — Ambulatory Visit (HOSPITAL_COMMUNITY): Payer: 59 | Admitting: Vascular Surgery

## 2021-08-20 ENCOUNTER — Other Ambulatory Visit: Payer: Self-pay

## 2021-08-20 ENCOUNTER — Inpatient Hospital Stay (HOSPITAL_COMMUNITY)
Admission: RE | Admit: 2021-08-20 | Discharge: 2021-08-26 | DRG: 453 | Disposition: A | Discharge status: Home Health | Payer: 59 | Attending: Specialist | Admitting: Specialist

## 2021-08-20 ENCOUNTER — Ambulatory Visit (HOSPITAL_COMMUNITY)
Admission: RE | Disposition: A | Discharge status: Home Health | Payer: Self-pay | Source: Home / Self Care | Attending: Specialist

## 2021-08-20 ENCOUNTER — Ambulatory Visit (HOSPITAL_COMMUNITY): Payer: 59 | Admitting: Physician Assistant

## 2021-08-20 DIAGNOSIS — E272 Addisonian crisis: Secondary | ICD-10-CM | POA: Diagnosis present

## 2021-08-20 DIAGNOSIS — I129 Hypertensive chronic kidney disease with stage 1 through stage 4 chronic kidney disease, or unspecified chronic kidney disease: Secondary | ICD-10-CM | POA: Diagnosis not present

## 2021-08-20 DIAGNOSIS — N179 Acute kidney failure, unspecified: Secondary | ICD-10-CM | POA: Diagnosis not present

## 2021-08-20 DIAGNOSIS — Z833 Family history of diabetes mellitus: Secondary | ICD-10-CM

## 2021-08-20 DIAGNOSIS — E861 Hypovolemia: Secondary | ICD-10-CM | POA: Diagnosis not present

## 2021-08-20 DIAGNOSIS — Z8249 Family history of ischemic heart disease and other diseases of the circulatory system: Secondary | ICD-10-CM

## 2021-08-20 DIAGNOSIS — Z6841 Body Mass Index (BMI) 40.0 and over, adult: Secondary | ICD-10-CM

## 2021-08-20 DIAGNOSIS — T39395A Adverse effect of other nonsteroidal anti-inflammatory drugs [NSAID], initial encounter: Secondary | ICD-10-CM | POA: Diagnosis not present

## 2021-08-20 DIAGNOSIS — M4326 Fusion of spine, lumbar region: Secondary | ICD-10-CM

## 2021-08-20 DIAGNOSIS — E1122 Type 2 diabetes mellitus with diabetic chronic kidney disease: Secondary | ICD-10-CM | POA: Diagnosis present

## 2021-08-20 DIAGNOSIS — Z880 Allergy status to penicillin: Secondary | ICD-10-CM

## 2021-08-20 DIAGNOSIS — E871 Hypo-osmolality and hyponatremia: Secondary | ICD-10-CM | POA: Diagnosis not present

## 2021-08-20 DIAGNOSIS — E662 Morbid (severe) obesity with alveolar hypoventilation: Secondary | ICD-10-CM | POA: Diagnosis present

## 2021-08-20 DIAGNOSIS — J386 Stenosis of larynx: Secondary | ICD-10-CM | POA: Diagnosis present

## 2021-08-20 DIAGNOSIS — M5116 Intervertebral disc disorders with radiculopathy, lumbar region: Secondary | ICD-10-CM | POA: Diagnosis present

## 2021-08-20 DIAGNOSIS — R0600 Dyspnea, unspecified: Secondary | ICD-10-CM

## 2021-08-20 DIAGNOSIS — D72829 Elevated white blood cell count, unspecified: Secondary | ICD-10-CM | POA: Diagnosis not present

## 2021-08-20 DIAGNOSIS — F419 Anxiety disorder, unspecified: Secondary | ICD-10-CM | POA: Diagnosis present

## 2021-08-20 DIAGNOSIS — T8141XA Infection following a procedure, superficial incisional surgical site, initial encounter: Secondary | ICD-10-CM | POA: Diagnosis not present

## 2021-08-20 DIAGNOSIS — Z930 Tracheostomy status: Secondary | ICD-10-CM

## 2021-08-20 DIAGNOSIS — A419 Sepsis, unspecified organism: Secondary | ICD-10-CM | POA: Diagnosis not present

## 2021-08-20 DIAGNOSIS — Z823 Family history of stroke: Secondary | ICD-10-CM

## 2021-08-20 DIAGNOSIS — D649 Anemia, unspecified: Secondary | ICD-10-CM | POA: Diagnosis present

## 2021-08-20 DIAGNOSIS — G8929 Other chronic pain: Secondary | ICD-10-CM | POA: Diagnosis present

## 2021-08-20 DIAGNOSIS — Z01818 Encounter for other preprocedural examination: Secondary | ICD-10-CM

## 2021-08-20 DIAGNOSIS — Z87891 Personal history of nicotine dependence: Secondary | ICD-10-CM

## 2021-08-20 DIAGNOSIS — M4316 Spondylolisthesis, lumbar region: Principal | ICD-10-CM

## 2021-08-20 DIAGNOSIS — D62 Acute posthemorrhagic anemia: Secondary | ICD-10-CM | POA: Diagnosis not present

## 2021-08-20 DIAGNOSIS — I9581 Postprocedural hypotension: Secondary | ICD-10-CM | POA: Diagnosis not present

## 2021-08-20 DIAGNOSIS — J449 Chronic obstructive pulmonary disease, unspecified: Secondary | ICD-10-CM | POA: Diagnosis present

## 2021-08-20 DIAGNOSIS — F32A Depression, unspecified: Secondary | ICD-10-CM | POA: Diagnosis present

## 2021-08-20 DIAGNOSIS — Z20822 Contact with and (suspected) exposure to covid-19: Secondary | ICD-10-CM | POA: Diagnosis present

## 2021-08-20 DIAGNOSIS — N183 Chronic kidney disease, stage 3 unspecified: Secondary | ICD-10-CM | POA: Diagnosis not present

## 2021-08-20 DIAGNOSIS — M48061 Spinal stenosis, lumbar region without neurogenic claudication: Secondary | ICD-10-CM | POA: Diagnosis present

## 2021-08-20 DIAGNOSIS — Z8261 Family history of arthritis: Secondary | ICD-10-CM

## 2021-08-20 DIAGNOSIS — Z825 Family history of asthma and other chronic lower respiratory diseases: Secondary | ICD-10-CM

## 2021-08-20 DIAGNOSIS — M62838 Other muscle spasm: Secondary | ICD-10-CM | POA: Diagnosis not present

## 2021-08-20 DIAGNOSIS — Z419 Encounter for procedure for purposes other than remedying health state, unspecified: Secondary | ICD-10-CM

## 2021-08-20 DIAGNOSIS — R000 Tachycardia, unspecified: Secondary | ICD-10-CM | POA: Diagnosis not present

## 2021-08-20 LAB — GLUCOSE, CAPILLARY
Glucose-Capillary: 96 mg/dL (ref 70–99)
Glucose-Capillary: 127 mg/dL — ABNORMAL HIGH (ref 70–99)
Glucose-Capillary: 190 mg/dL — ABNORMAL HIGH (ref 70–99)
Glucose-Capillary: 216 mg/dL — ABNORMAL HIGH (ref 70–99)

## 2021-08-20 LAB — SURGICAL PCR SCREEN
MRSA, PCR: NEGATIVE
Staphylococcus aureus: NEGATIVE

## 2021-08-20 SURGERY — POSTERIOR LUMBAR FUSION 1 LEVEL
Anesthesia: General

## 2021-08-20 MED ORDER — ONDANSETRON HCL 4 MG/2ML IJ SOLN: 4.0000 mg | Freq: Four times a day (QID) | INTRAMUSCULAR | Status: DC | PRN

## 2021-08-20 MED ORDER — SODIUM CHLORIDE 0.9% FLUSH: 3.0000 mL | INTRAVENOUS | Status: DC | PRN

## 2021-08-20 MED ORDER — SUGAMMADEX SODIUM 500 MG/5ML IV SOLN
INTRAVENOUS | Status: DC | PRN
  Administered 2021-08-20: 436 mg via INTRAVENOUS

## 2021-08-20 MED ORDER — FLEET ENEMA 7-19 GM/118ML RE ENEM: 1.0000 | ENEMA | Freq: Once | RECTAL | Status: DC | PRN

## 2021-08-20 MED ORDER — METHOCARBAMOL 500 MG PO TABS
500.0000 mg | ORAL_TABLET | Freq: Four times a day (QID) | ORAL | Status: DC | PRN
  Administered 2021-08-20 – 2021-08-26 (×13): 500 mg via ORAL
  Filled 2021-08-20 (×11): qty 1

## 2021-08-20 MED ORDER — CEFAZOLIN SODIUM-DEXTROSE 2-4 GM/100ML-% IV SOLN
INTRAVENOUS | Status: AC
  Filled 2021-08-20: qty 100

## 2021-08-20 MED ORDER — DOCUSATE SODIUM 100 MG PO CAPS
100.0000 mg | ORAL_CAPSULE | Freq: Two times a day (BID) | ORAL | Status: DC
  Administered 2021-08-20 – 2021-08-26 (×9): 100 mg via ORAL
  Filled 2021-08-20 (×9): qty 1

## 2021-08-20 MED ORDER — LIDOCAINE 2% (20 MG/ML) 5 ML SYRINGE
INTRAMUSCULAR | Status: DC | PRN
  Administered 2021-08-20: 60 mg via INTRAVENOUS

## 2021-08-20 MED ORDER — ORAL CARE MOUTH RINSE: 15.0000 mL | Freq: Once | OROMUCOSAL | Status: AC

## 2021-08-20 MED ORDER — HYDROCODONE-ACETAMINOPHEN 7.5-325 MG PO TABS
2.0000 | ORAL_TABLET | ORAL | Status: DC | PRN
  Administered 2021-08-20 – 2021-08-21 (×4): 2 via ORAL
  Filled 2021-08-20: qty 2

## 2021-08-20 MED ORDER — METHOCARBAMOL 750 MG PO TABS: 750.0000 mg | ORAL_TABLET | Freq: Three times a day (TID) | ORAL | Status: DC | PRN

## 2021-08-20 MED ORDER — CEFAZOLIN SODIUM-DEXTROSE 2-4 GM/100ML-% IV SOLN
2.0000 g | Freq: Three times a day (TID) | INTRAVENOUS | Status: AC
  Administered 2021-08-20 – 2021-08-21 (×2): 2 g via INTRAVENOUS

## 2021-08-20 MED ORDER — MIDAZOLAM HCL 2 MG/2ML IJ SOLN
INTRAMUSCULAR | Status: AC
  Filled 2021-08-20: qty 2

## 2021-08-20 MED ORDER — THROMBIN 20000 UNITS EX SOLR: CUTANEOUS | Status: DC | PRN

## 2021-08-20 MED ORDER — DEXAMETHASONE SODIUM PHOSPHATE 4 MG/ML IJ SOLN
INTRAMUSCULAR | Status: DC | PRN
  Administered 2021-08-20: 8 mg via INTRAVENOUS

## 2021-08-20 MED ORDER — CEFAZOLIN SODIUM-DEXTROSE 2-4 GM/100ML-% IV SOLN
2.0000 g | INTRAVENOUS | Status: AC
  Administered 2021-08-20: 2 g via INTRAVENOUS

## 2021-08-20 MED ORDER — GABAPENTIN 300 MG PO CAPS: 300.0000 mg | ORAL_CAPSULE | Freq: Three times a day (TID) | ORAL | Status: DC

## 2021-08-20 MED ORDER — FENTANYL CITRATE (PF) 250 MCG/5ML IJ SOLN
INTRAMUSCULAR | Status: AC
  Filled 2021-08-20: qty 5

## 2021-08-20 MED ORDER — HYDROCODONE-ACETAMINOPHEN 7.5-325 MG PO TABS: 1.0000 | ORAL_TABLET | ORAL | Status: DC | PRN

## 2021-08-20 MED ORDER — PANTOPRAZOLE SODIUM 40 MG IV SOLR
40.0000 mg | Freq: Every day | INTRAVENOUS | Status: DC
  Administered 2021-08-20: 40 mg via INTRAVENOUS

## 2021-08-20 MED ORDER — LISINOPRIL 20 MG PO TABS
20.0000 mg | ORAL_TABLET | Freq: Every day | ORAL | Status: DC
  Administered 2021-08-20: 20 mg via ORAL

## 2021-08-20 MED ORDER — PANTOPRAZOLE SODIUM 40 MG PO TBEC: 40.0000 mg | DELAYED_RELEASE_TABLET | Freq: Every day | ORAL | Status: DC | PRN

## 2021-08-20 MED ORDER — ONDANSETRON HCL 4 MG/2ML IJ SOLN
INTRAMUSCULAR | Status: DC | PRN
  Administered 2021-08-20: 4 mg via INTRAVENOUS

## 2021-08-20 MED ORDER — FENTANYL CITRATE (PF) 100 MCG/2ML IJ SOLN
25.0000 ug | INTRAMUSCULAR | Status: DC | PRN
  Administered 2021-08-20 (×2): 25 ug via INTRAVENOUS
  Administered 2021-08-20 (×2): 50 ug via INTRAVENOUS

## 2021-08-20 MED ORDER — BUPROPION HCL ER (SR) 150 MG PO TB12
150.0000 mg | ORAL_TABLET | Freq: Two times a day (BID) | ORAL | Status: DC
  Administered 2021-08-20 – 2021-08-26 (×12): 150 mg via ORAL
  Filled 2021-08-20 (×11): qty 1

## 2021-08-20 MED ORDER — ALBUTEROL SULFATE (2.5 MG/3ML) 0.083% IN NEBU
2.5000 mg | INHALATION_SOLUTION | Freq: Four times a day (QID) | RESPIRATORY_TRACT | Status: DC | PRN
  Administered 2021-08-25: 18:00:00 2.5 mg via RESPIRATORY_TRACT
  Filled 2021-08-20: qty 3

## 2021-08-20 MED ORDER — ALUM & MAG HYDROXIDE-SIMETH 200-200-20 MG/5ML PO SUSP: 30.0000 mL | Freq: Four times a day (QID) | ORAL | Status: DC | PRN

## 2021-08-20 MED ORDER — ACETAMINOPHEN 325 MG PO TABS: 650.0000 mg | ORAL_TABLET | ORAL | Status: DC | PRN

## 2021-08-20 MED ORDER — DAPAGLIFLOZIN PROPANEDIOL 5 MG PO TABS
5.0000 mg | ORAL_TABLET | Freq: Every day | ORAL | Status: DC
  Administered 2021-08-21: 5 mg via ORAL
  Filled 2021-08-20: qty 1

## 2021-08-20 MED ORDER — MENTHOL 3 MG MT LOZG: 1.0000 | LOZENGE | OROMUCOSAL | Status: DC | PRN

## 2021-08-20 MED ORDER — SODIUM CHLORIDE 0.9 % IV SOLN: INTRAVENOUS | Status: DC

## 2021-08-20 MED ORDER — CHLORHEXIDINE GLUCONATE 0.12 % MT SOLN
OROMUCOSAL | Status: AC
  Filled 2021-08-20: qty 15

## 2021-08-20 MED ORDER — SILVER SULFADIAZINE 1 % EX CREA
1.0000 | TOPICAL_CREAM | Freq: Every day | CUTANEOUS | Status: DC
Start: 2021-08-20 — End: 2021-08-20

## 2021-08-20 MED ORDER — BUPIVACAINE LIPOSOME 1.3 % IJ SUSP
INTRAMUSCULAR | Status: DC | PRN
  Administered 2021-08-20: 20 mL

## 2021-08-20 MED ORDER — BISACODYL 5 MG PO TBEC: 5.0000 mg | DELAYED_RELEASE_TABLET | Freq: Every day | ORAL | Status: DC | PRN

## 2021-08-20 MED ORDER — ONDANSETRON HCL 4 MG PO TABS: 4.0000 mg | ORAL_TABLET | Freq: Three times a day (TID) | ORAL | Status: DC | PRN

## 2021-08-20 MED ORDER — ROCURONIUM BROMIDE 10 MG/ML (PF) SYRINGE
PREFILLED_SYRINGE | INTRAVENOUS | Status: DC | PRN
  Administered 2021-08-20: 30 mg via INTRAVENOUS
  Administered 2021-08-20: 20 mg via INTRAVENOUS
  Administered 2021-08-20: 50 mg via INTRAVENOUS

## 2021-08-20 MED ORDER — GABAPENTIN 300 MG PO CAPS: 600.0000 mg | ORAL_CAPSULE | Freq: Two times a day (BID) | ORAL | Status: DC

## 2021-08-20 MED ORDER — ACETAMINOPHEN 10 MG/ML IV SOLN
INTRAVENOUS | Status: AC
  Filled 2021-08-20: qty 100

## 2021-08-20 MED ORDER — OXYCODONE-ACETAMINOPHEN 10-325 MG PO TABS: 1.0000 | ORAL_TABLET | Freq: Four times a day (QID) | ORAL | Status: DC

## 2021-08-20 MED ORDER — TOPIRAMATE 25 MG PO TABS
150.0000 mg | ORAL_TABLET | Freq: Two times a day (BID) | ORAL | Status: DC
  Administered 2021-08-20 – 2021-08-25 (×4): 150 mg via ORAL
  Filled 2021-08-20 (×8): qty 2

## 2021-08-20 MED ORDER — PHENOL 1.4 % MT LIQD: 1.0000 | OROMUCOSAL | Status: DC | PRN

## 2021-08-20 MED ORDER — BUPIVACAINE HCL (PF) 0.5 % IJ SOLN
INTRAMUSCULAR | Status: AC
  Filled 2021-08-20: qty 30

## 2021-08-20 MED ORDER — HYDROCODONE-ACETAMINOPHEN 7.5-325 MG PO TABS
1.0000 | ORAL_TABLET | Freq: Four times a day (QID) | ORAL | Status: DC
  Administered 2021-08-20 – 2021-08-21 (×2): 1 via ORAL
  Filled 2021-08-20: qty 1

## 2021-08-20 MED ORDER — INSULIN DETEMIR 100 UNIT/ML ~~LOC~~ SOLN
45.0000 [IU] | Freq: Every day | SUBCUTANEOUS | Status: DC
  Administered 2021-08-20 – 2021-08-21 (×2): 45 [IU] via SUBCUTANEOUS
  Filled 2021-08-20 (×3): qty 0.45

## 2021-08-20 MED ORDER — ATORVASTATIN CALCIUM 10 MG PO TABS
20.0000 mg | ORAL_TABLET | Freq: Every day | ORAL | Status: DC
  Administered 2021-08-21 – 2021-08-26 (×6): 20 mg via ORAL
  Filled 2021-08-20 (×6): qty 2

## 2021-08-20 MED ORDER — TIZANIDINE HCL 4 MG PO TABS
4.0000 mg | ORAL_TABLET | Freq: Three times a day (TID) | ORAL | Status: DC | PRN
  Administered 2021-08-20 – 2021-08-25 (×6): 4 mg via ORAL
  Filled 2021-08-20 (×4): qty 1

## 2021-08-20 MED ORDER — SERTRALINE HCL 100 MG PO TABS
100.0000 mg | ORAL_TABLET | Freq: Every day | ORAL | Status: DC
  Administered 2021-08-20 – 2021-08-25 (×6): 100 mg via ORAL
  Filled 2021-08-20 (×5): qty 1

## 2021-08-20 MED ORDER — CHLORHEXIDINE GLUCONATE 0.12 % MT SOLN
15.0000 mL | Freq: Once | OROMUCOSAL | Status: AC
  Administered 2021-08-20: 15 mL via OROMUCOSAL

## 2021-08-20 MED ORDER — SODIUM CHLORIDE 0.9% FLUSH
3.0000 mL | Freq: Two times a day (BID) | INTRAVENOUS | Status: DC
  Administered 2021-08-20 – 2021-08-26 (×10): 3 mL via INTRAVENOUS

## 2021-08-20 MED ORDER — OXYCODONE HCL 5 MG/5ML PO SOLN: 5.0000 mg | Freq: Once | ORAL | Status: AC | PRN

## 2021-08-20 MED ORDER — POLYETHYLENE GLYCOL 3350 17 G PO PACK
17.0000 g | PACK | Freq: Every day | ORAL | Status: DC | PRN
  Filled 2021-08-20: qty 1

## 2021-08-20 MED ORDER — PHENYLEPHRINE HCL-NACL 20-0.9 MG/250ML-% IV SOLN
INTRAVENOUS | Status: DC | PRN
  Administered 2021-08-20: 50 ug/min via INTRAVENOUS

## 2021-08-20 MED ORDER — MORPHINE SULFATE (PF) 2 MG/ML IV SOLN
1.0000 mg | INTRAVENOUS | Status: DC | PRN
  Administered 2021-08-20 – 2021-08-21 (×4): 1 mg via INTRAVENOUS

## 2021-08-20 MED ORDER — ONETOUCH VERIO W/DEVICE KIT: 1.0000 | PACK | Freq: Every day | Status: DC

## 2021-08-20 MED ORDER — SODIUM CHLORIDE 0.9 % IV SOLN: 250.0000 mL | INTRAVENOUS | Status: DC

## 2021-08-20 MED ORDER — ONDANSETRON HCL 4 MG PO TABS: 4.0000 mg | ORAL_TABLET | Freq: Four times a day (QID) | ORAL | Status: DC | PRN

## 2021-08-20 MED ORDER — FENTANYL CITRATE (PF) 100 MCG/2ML IJ SOLN
INTRAMUSCULAR | Status: AC
  Filled 2021-08-20: qty 2

## 2021-08-20 MED ORDER — BUPIVACAINE HCL 0.5 % IJ SOLN
INTRAMUSCULAR | Status: DC | PRN
  Administered 2021-08-20: 20 mL

## 2021-08-20 MED ORDER — MELATONIN 3 MG PO TABS
3.0000 mg | ORAL_TABLET | Freq: Every day | ORAL | Status: DC
  Administered 2021-08-20 – 2021-08-25 (×6): 3 mg via ORAL
  Filled 2021-08-20 (×5): qty 1

## 2021-08-20 MED ORDER — INSULIN ASPART 100 UNIT/ML IJ SOLN
4.0000 [IU] | Freq: Three times a day (TID) | INTRAMUSCULAR | Status: DC
  Administered 2021-08-20 – 2021-08-22 (×5): 4 [IU] via SUBCUTANEOUS

## 2021-08-20 MED ORDER — ACETAMINOPHEN 650 MG RE SUPP: 650.0000 mg | RECTAL | Status: DC | PRN

## 2021-08-20 MED ORDER — MIDAZOLAM HCL 5 MG/5ML IJ SOLN
INTRAMUSCULAR | Status: DC | PRN
  Administered 2021-08-20: 2 mg via INTRAVENOUS

## 2021-08-20 MED ORDER — PROPOFOL 10 MG/ML IV BOLUS
INTRAVENOUS | Status: DC | PRN
  Administered 2021-08-20: 100 mg via INTRAVENOUS

## 2021-08-20 MED ORDER — INSULIN ASPART 100 UNIT/ML IJ SOLN
0.0000 [IU] | Freq: Three times a day (TID) | INTRAMUSCULAR | Status: DC
  Administered 2021-08-20: 3 [IU] via SUBCUTANEOUS
  Administered 2021-08-21: 5 [IU] via SUBCUTANEOUS
  Administered 2021-08-21: 8 [IU] via SUBCUTANEOUS
  Administered 2021-08-21: 4 [IU] via SUBCUTANEOUS
  Administered 2021-08-22: 2 [IU] via SUBCUTANEOUS

## 2021-08-20 MED ORDER — FUROSEMIDE 20 MG PO TABS: 20.0000 mg | ORAL_TABLET | Freq: Every day | ORAL | Status: DC | PRN

## 2021-08-20 MED ORDER — ALBUTEROL SULFATE HFA 108 (90 BASE) MCG/ACT IN AERS: 2.0000 | INHALATION_SPRAY | Freq: Four times a day (QID) | RESPIRATORY_TRACT | Status: DC | PRN

## 2021-08-20 MED ORDER — AMISULPRIDE (ANTIEMETIC) 5 MG/2ML IV SOLN: 10.0000 mg | Freq: Once | INTRAVENOUS | Status: DC | PRN

## 2021-08-20 MED ORDER — THROMBIN (RECOMBINANT) 20000 UNITS EX SOLR
CUTANEOUS | Status: AC
  Filled 2021-08-20: qty 20000

## 2021-08-20 MED ORDER — DICLOFENAC SODIUM 1 % EX GEL
4.0000 g | Freq: Four times a day (QID) | CUTANEOUS | Status: DC
  Administered 2021-08-21 (×2): 4 g via TOPICAL
  Filled 2021-08-20: qty 100

## 2021-08-20 MED ORDER — DEXMEDETOMIDINE (PRECEDEX) IN NS 20 MCG/5ML (4 MCG/ML) IV SYRINGE
PREFILLED_SYRINGE | INTRAVENOUS | Status: DC | PRN
  Administered 2021-08-20: 20 ug via INTRAVENOUS

## 2021-08-20 MED ORDER — ACETAMINOPHEN 10 MG/ML IV SOLN
1000.0000 mg | Freq: Once | INTRAVENOUS | Status: DC | PRN
  Administered 2021-08-20: 1000 mg via INTRAVENOUS

## 2021-08-20 MED ORDER — BUPIVACAINE LIPOSOME 1.3 % IJ SUSP
10.0000 mL | Freq: Once | INTRAMUSCULAR | Status: DC
  Filled 2021-08-20: qty 10

## 2021-08-20 MED ORDER — LORATADINE 10 MG PO TABS
10.0000 mg | ORAL_TABLET | Freq: Every day | ORAL | Status: DC
  Administered 2021-08-20 – 2021-08-26 (×7): 10 mg via ORAL
  Filled 2021-08-20 (×6): qty 1

## 2021-08-20 MED ORDER — 0.9 % SODIUM CHLORIDE (POUR BTL) OPTIME
TOPICAL | Status: DC | PRN
  Administered 2021-08-20: 1000 mL

## 2021-08-20 MED ORDER — HYDROXYZINE HCL 25 MG PO TABS
25.0000 mg | ORAL_TABLET | Freq: Three times a day (TID) | ORAL | Status: DC | PRN
  Administered 2021-08-20 – 2021-08-25 (×5): 25 mg via ORAL
  Filled 2021-08-20 (×4): qty 1

## 2021-08-20 MED ORDER — METHOCARBAMOL 1000 MG/10ML IJ SOLN
500.0000 mg | Freq: Four times a day (QID) | INTRAVENOUS | Status: DC | PRN
  Administered 2021-08-21 – 2021-08-22 (×3): 500 mg via INTRAVENOUS
  Filled 2021-08-20: qty 500
  Filled 2021-08-20: qty 5
  Filled 2021-08-20: qty 500
  Filled 2021-08-20: qty 5

## 2021-08-20 MED ORDER — OXYCODONE HCL 5 MG PO TABS
ORAL_TABLET | ORAL | Status: AC
  Filled 2021-08-20: qty 1

## 2021-08-20 MED ORDER — MOMETASONE FURO-FORMOTEROL FUM 200-5 MCG/ACT IN AERO
2.0000 | INHALATION_SPRAY | Freq: Two times a day (BID) | RESPIRATORY_TRACT | Status: DC
  Administered 2021-08-21 – 2021-08-25 (×9): 2 via RESPIRATORY_TRACT
  Filled 2021-08-20: qty 8.8

## 2021-08-20 MED ORDER — LACTATED RINGERS IV SOLN: INTRAVENOUS | Status: DC

## 2021-08-20 MED ORDER — SUMATRIPTAN SUCCINATE 50 MG PO TABS
50.0000 mg | ORAL_TABLET | ORAL | Status: DC | PRN
  Filled 2021-08-20: qty 1

## 2021-08-20 MED ORDER — GABAPENTIN 600 MG PO TABS
600.0000 mg | ORAL_TABLET | Freq: Three times a day (TID) | ORAL | Status: DC
  Administered 2021-08-20 – 2021-08-23 (×9): 600 mg via ORAL
  Filled 2021-08-20 (×7): qty 1

## 2021-08-20 MED ORDER — SUCRALFATE 1 G PO TABS
1.0000 g | ORAL_TABLET | Freq: Every day | ORAL | Status: DC
  Administered 2021-08-20 – 2021-08-25 (×6): 1 g via ORAL
  Filled 2021-08-20 (×7): qty 1

## 2021-08-20 MED ORDER — SUGAMMADEX SODIUM 500 MG/5ML IV SOLN
INTRAVENOUS | Status: AC
  Filled 2021-08-20: qty 5

## 2021-08-20 MED ORDER — FENTANYL CITRATE (PF) 100 MCG/2ML IJ SOLN
INTRAMUSCULAR | Status: DC | PRN
  Administered 2021-08-20: 150 ug via INTRAVENOUS
  Administered 2021-08-20: 100 ug via INTRAVENOUS
  Administered 2021-08-20: 50 ug via INTRAVENOUS
  Administered 2021-08-20: 100 ug via INTRAVENOUS
  Administered 2021-08-20 (×2): 50 ug via INTRAVENOUS

## 2021-08-20 MED ORDER — PROMETHAZINE HCL 25 MG/ML IJ SOLN: 6.2500 mg | INTRAMUSCULAR | Status: DC | PRN

## 2021-08-20 MED ORDER — OXYCODONE HCL 5 MG PO TABS
5.0000 mg | ORAL_TABLET | Freq: Once | ORAL | Status: AC | PRN
  Administered 2021-08-20: 5 mg via ORAL

## 2021-08-20 SURGICAL SUPPLY — 73 items
ADH SKN CLS APL DERMABOND .7 (GAUZE/BANDAGES/DRESSINGS) ×1
AGENT HMST MTR 8 SURGIFLO (HEMOSTASIS)
BAG COUNTER SPONGE SURGICOUNT (BAG) ×2 IMPLANT
BAG SPNG CNTER NS LX DISP (BAG) ×1
BLADE CLIPPER SURG (BLADE) IMPLANT
BONE VIVIGEN FORMABLE 10CC (Bone Implant) ×2 IMPLANT
BUR MATCHSTICK NEURO 3.0 LAGG (BURR) ×2 IMPLANT
BUR SABER RD CUTTING 3.0 (BURR) IMPLANT
CAP LOCKING THREADED (Cap) ×4 IMPLANT
COVER BACK TABLE 80X110 HD (DRAPES) ×2 IMPLANT
COVER SURGICAL LIGHT HANDLE (MISCELLANEOUS) ×2 IMPLANT
DECANTER SPIKE VIAL GLASS SM (MISCELLANEOUS) ×2 IMPLANT
DERMABOND ADVANCED (GAUZE/BANDAGES/DRESSINGS) ×1
DERMABOND ADVANCED .7 DNX12 (GAUZE/BANDAGES/DRESSINGS) ×1 IMPLANT
DRAPE C-ARM 42X72 X-RAY (DRAPES) ×2 IMPLANT
DRAPE C-ARMOR (DRAPES) ×2 IMPLANT
DRAPE MICROSCOPE LEICA (MISCELLANEOUS) ×2 IMPLANT
DRAPE POUCH INSTRU U-SHP 10X18 (DRAPES) ×2 IMPLANT
DRAPE SURG 17X23 STRL (DRAPES) ×8 IMPLANT
DRSG MEPILEX BORDER 4X4 (GAUZE/BANDAGES/DRESSINGS) IMPLANT
DRSG MEPILEX BORDER 4X8 (GAUZE/BANDAGES/DRESSINGS) ×1 IMPLANT
DURAPREP 26ML APPLICATOR (WOUND CARE) ×2 IMPLANT
ELECT BLADE 4.0 EZ CLEAN MEGAD (MISCELLANEOUS) ×2
ELECT BLADE 6.5 EXT (BLADE) IMPLANT
ELECT CAUTERY BLADE 6.4 (BLADE) ×2 IMPLANT
ELECT REM PT RETURN 9FT ADLT (ELECTROSURGICAL) ×2
ELECTRODE BLDE 4.0 EZ CLN MEGD (MISCELLANEOUS) ×1 IMPLANT
ELECTRODE REM PT RTRN 9FT ADLT (ELECTROSURGICAL) ×1 IMPLANT
EVACUATOR 1/8 PVC DRAIN (DRAIN) IMPLANT
GAUZE SPONGE 4X4 12PLY STRL (GAUZE/BANDAGES/DRESSINGS) ×2 IMPLANT
GLOVE SRG 8 PF TXTR STRL LF DI (GLOVE) ×1 IMPLANT
GLOVE SURG 8.5 LATEX PF (GLOVE) ×2 IMPLANT
GLOVE SURG LTX SZ9 (GLOVE) ×2 IMPLANT
GLOVE SURG ORTHO LTX SZ7.5 (GLOVE) ×2 IMPLANT
GLOVE SURG UNDER POLY LF SZ8 (GLOVE) ×2
GOWN STRL REUS W/ TWL LRG LVL3 (GOWN DISPOSABLE) ×1 IMPLANT
GOWN STRL REUS W/TWL 2XL LVL3 (GOWN DISPOSABLE) ×4 IMPLANT
GOWN STRL REUS W/TWL LRG LVL3 (GOWN DISPOSABLE) ×2
GRAFT BNE MATRIX VG FRMBL L 10 (Bone Implant) IMPLANT
INTERBODY SABLE 10X26 7-14 15D (Miscellaneous) ×1 IMPLANT
KIT BASIN OR (CUSTOM PROCEDURE TRAY) ×2 IMPLANT
KIT POSITION SURG JACKSON T1 (MISCELLANEOUS) ×2 IMPLANT
KIT TURNOVER KIT B (KITS) ×2 IMPLANT
NDL SPNL 18GX3.5 QUINCKE PK (NEEDLE) ×1 IMPLANT
NEEDLE 22X1 1/2 (OR ONLY) (NEEDLE) ×2 IMPLANT
NEEDLE SPNL 18GX3.5 QUINCKE PK (NEEDLE) ×2 IMPLANT
NS IRRIG 1000ML POUR BTL (IV SOLUTION) ×2 IMPLANT
PACK LAMINECTOMY ORTHO (CUSTOM PROCEDURE TRAY) ×2 IMPLANT
PAD ARMBOARD 7.5X6 YLW CONV (MISCELLANEOUS) ×4 IMPLANT
PATTIES SURGICAL .75X.75 (GAUZE/BANDAGES/DRESSINGS) ×2 IMPLANT
PATTIES SURGICAL 1X1 (DISPOSABLE) ×2 IMPLANT
ROD CREO 45MM SPINAL (Rod) ×2 IMPLANT
SCREW PA THRD CREO TULIP 5.5X4 (Head) ×7 IMPLANT
SCREW SD MOD CREO 6.5X40 (Screw) ×3 IMPLANT
SCREW SD MOD CREO 6.5X45 (Screw) ×2 IMPLANT
SPOGE SURGIFLO 8M (HEMOSTASIS)
SPONGE SURGIFLO 8M (HEMOSTASIS) IMPLANT
SPONGE SURGIFOAM ABS GEL 100 (HEMOSTASIS) ×2 IMPLANT
SPONGE T-LAP 4X18 ~~LOC~~+RFID (SPONGE) ×1 IMPLANT
SUT VIC AB 0 CT1 27 (SUTURE) ×2
SUT VIC AB 0 CT1 27XBRD ANBCTR (SUTURE) ×1 IMPLANT
SUT VIC AB 1 CTX 36 (SUTURE) ×4
SUT VIC AB 1 CTX36XBRD ANBCTR (SUTURE) ×2 IMPLANT
SUT VIC AB 2-0 CT1 27 (SUTURE) ×2
SUT VIC AB 2-0 CT1 TAPERPNT 27 (SUTURE) ×1 IMPLANT
SUT VIC AB 3-0 X1 27 (SUTURE) ×2 IMPLANT
SYR 20ML LL LF (SYRINGE) ×2 IMPLANT
SYR CONTROL 10ML LL (SYRINGE) ×4 IMPLANT
TOWEL GREEN STERILE (TOWEL DISPOSABLE) ×2 IMPLANT
TOWEL GREEN STERILE FF (TOWEL DISPOSABLE) ×2 IMPLANT
TRAY FOLEY MTR SLVR 16FR STAT (SET/KITS/TRAYS/PACK) ×2 IMPLANT
WATER STERILE IRR 1000ML POUR (IV SOLUTION) ×2 IMPLANT
YANKAUER SUCT BULB TIP NO VENT (SUCTIONS) ×2 IMPLANT

## 2021-08-20 NOTE — Progress Notes (Signed)
Orthopedic Tech Progress Note Patient Details:  Arlone Lenhardt [name] Mar 23, 1971 [id number]  Ortho Devices Type of Ortho Device: Lumbar corsett Ortho Device/Splint Location: BACK Ortho Device/Splint Interventions: Ordered   Post Interventions Patient Tolerated: Well Instructions Provided: Care of device  Janit Pagan 08/20/2021, 5:11 PM

## 2021-08-20 NOTE — Transfer of Care (Signed)
Immediate Anesthesia Transfer of Care Note  Patient: [name]  Procedure(s) Performed: RIGHT L4-5 TRANSFORAMINAL LUMBAR INTERBODY FUSION WITH RODS, SCREWS AND CAGE, LOCAL BONE GRAFT, ALLOGRAFT BONE GRAFT, VIVIGEN  Patient Location: PACU  Anesthesia Type:General  Level of Consciousness: awake, alert  and oriented  Airway & Oxygen Therapy: Patient Spontanous Breathing and Patient connected to tracheostomy mask oxygen  Post-op Assessment: Report given to RN and Post -op Vital signs reviewed and stable  Post vital signs: Reviewed and stable  Last Vitals:  Vitals Value Taken Time  BP 151/101 08/20/21 1241  Temp 36.3 C 08/20/21 1240  Pulse 87 08/20/21 1244  Resp 11 08/20/21 1244  SpO2 100 % 08/20/21 1244  Vitals shown include unvalidated device data.  Last Pain:  Vitals:   08/20/21 1240  TempSrc:   PainSc: Asleep      Patients Stated Pain Goal: 3 (74/45/14 [id number])  Complications: No notable events documented.

## 2021-08-20 NOTE — Brief Op Note (Signed)
08/20/2021  12:31 PM  PATIENT:  [name]  50 y.o. female  PRE-OPERATIVE DIAGNOSIS:  L4-5 central disc herniation with radiculopathy, degenerative disc disease, Grade I spondylolisthesis  POST-OPERATIVE DIAGNOSIS:  L4-5 central disc herniation with radiculopathy, degenerative disc disease, Grade I spondylolisthesis  PROCEDURE:  Procedure(s): RIGHT L4-5 TRANSFORAMINAL LUMBAR INTERBODY FUSION WITH RODS, SCREWS AND CAGE, LOCAL BONE GRAFT, ALLOGRAFT BONE GRAFT, VIVIGEN (N/A)  SURGEON:  Surgeon(s) and Role:    * Jessy Oto, MD - Primary  PHYSICIAN ASSISTANT: Benjiman Core, PA-C   ANESTHESIA:   local and general  EBL:  250 mL   BLOOD ADMINISTERED:none  DRAINS: Urinary Catheter (Foley)   LOCAL MEDICATIONS USED:  MARCAINE )% 1:1 EXPAREL1.3% Amount: 20 ml  SPECIMEN:  No Specimen  DISPOSITION OF SPECIMEN:  N/A  COUNTS:  YES  TOURNIQUET:  * No tourniquets in log *  DICTATION: .Dragon Dictation  PLAN OF CARE: Admit for overnight observation  PATIENT DISPOSITION:  PACU - hemodynamically stable.   Delay start of Pharmacological VTE agent (>24hrs) due to surgical blood loss or risk of bleeding: yes

## 2021-08-20 NOTE — Anesthesia Postprocedure Evaluation (Signed)
Anesthesia Post Note  Patient: [name]  Procedure(s) Performed: RIGHT L4-5 TRANSFORAMINAL LUMBAR INTERBODY FUSION WITH RODS, SCREWS AND CAGE, LOCAL BONE GRAFT, ALLOGRAFT BONE GRAFT, VIVIGEN     Patient location during evaluation: PACU Anesthesia Type: General Level of consciousness: awake Pain management: pain level controlled Vital Signs Assessment: post-procedure vital signs reviewed and stable Respiratory status: spontaneous breathing, nonlabored ventilation, respiratory function stable and patient connected to tracheostomy mask oxygen Cardiovascular status: blood pressure returned to baseline and stable Postop Assessment: no apparent nausea or vomiting Anesthetic complications: no   No notable events documented.  Last Vitals:  Vitals:   08/20/21 1940 08/20/21 2013  BP: (!) 141/87   Pulse: (!) 110 (!) 106  Resp: 18 18  Temp: 37.3 C   SpO2: 95% 97%    Last Pain:  Vitals:   08/20/21 2028  TempSrc:   PainSc: 9                  Phillippe Orlick P Audrie Kuri

## 2021-08-20 NOTE — Anesthesia Procedure Notes (Signed)
Procedure Name: Intubation Date/Time: 08/20/2021 8:15 AM Performed by: Minerva Ends, CRNA Pre-anesthesia Checklist: Patient identified, Emergency Drugs available, Suction available and Patient being monitored Patient Re-evaluated:Patient Re-evaluated prior to induction Oxygen Delivery Method: Circle system utilized Preoxygenation: Pre-oxygenation with 100% oxygen Induction Type: IV induction, Tracheostomy and Combination inhalational/ intravenous induction Ventilation: Mask ventilation without difficulty Tube type: Reinforced Tube size: 6.0 mm Number of attempts: 1 Placement Confirmation: positive ETCO2 and breath sounds checked- equal and bilateral Secured at: 6 cm Tube secured with: Tape

## 2021-08-20 NOTE — Op Note (Incomplete)
08/20/2021  12:34 PM  PATIENT:  [name]  50 y.o. female  MRN: [id number]  OPERATIVE REPORT  PRE-OPERATIVE DIAGNOSIS:  L4-5 central disc herniation with radiculopathy, degenerative disc disease, Grade I spondylolisthesis  POST-OPERATIVE DIAGNOSIS:  L4-5 central disc herniation with radiculopathy, degenerative disc disease, Grade I spondylolisthesis  PROCEDURE:  Procedure(s): RIGHT L4-5 TRANSFORAMINAL LUMBAR INTERBODY FUSION WITH RODS, SCREWS AND CAGE, LOCAL BONE GRAFT, ALLOGRAFT BONE GRAFT, VIVIGEN    SURGEON:  Jessy Oto, MD     ASSISTANT: Benjiman Core, PA-C  (Present throughout the entire procedure and necessary for completion of procedure in a timely manner)     ANESTHESIA:  General,    COMPLICATIONS:  None.     COMPONENTS:   Implant Name Type Inv. Item Serial No. Manufacturer Lot No. LRB No. Used Action  BONE VIVIGEN FORMABLE 10CC - [id number] Bone Implant BONE VIVIGEN FORMABLE 10CC [id number] [place]  N/A 1 Implanted  INTERBODY SABLE 10X26 7-14 15D - [id number] Miscellaneous INTERBODY SABLE 10X26 7-14 15D  [employer] [id number] N/A 1 Implanted  CAP LOCKING THREADED - [id number] Cap CAP LOCKING THREADED  [employer]  N/A 4 Implanted  SCREW PA THRD CREO TULIP 5.5X4 - [id number] Head SCREW PA THRD CREO TULIP 5.5X4  [employer]  N/A 3 Implanted  SCREW PA THRD CREO TULIP 5.5X4 - [id number] Head SCREW PA THRD CREO TULIP 5.5X4  [employer]  N/A 2 Implanted and Explanted  creo one 6.5x 9m robotic self drilling modular      N/A 2 Implanted  6.5 x 451mrobotic selft drilling     [employer]  N/A 3 Implanted  ROD CREO 45MM SPINAL - [id number] ROD CREO 45MM SPINAL  [employer]  N/A 2 Implanted    PROCEDURE:The patient was met in the holding area, and the appropriate lumbar level right L4-5  identified and marked with an x and my initials.The patient was then transported to OR. The patient was then placed under general anesthesia  without difficulty.The patient received appropriate preoperative antibiotic prophylaxis ancef.  Nursing staff inserted a Foley catheter under sterile conditions. She was then turned to a prone position JaTonka [place] table was used for this case. All pressure points were well padded PAS stocking applied bilateral lower extremity to prevent DVT. Standard prep DuraPrep solution. Draped in the usual manner. Time-out procedure was called and correct .   The incision was at L4-5 and determined using C-arm to mark the inferior L5 pedicles and  an additionally extended up to the L3 spinous process.   Bovie electric cautery was used to control bleeding and carefully dissection was carried down along the lateral aspects of the spinous process of L3 to L5. Cobb then used to carefully elevate the paralumbar muscle and the incision in the midline was carried to to the level of the base of the residual spinous processes. The posterior exposure area extending from the base of the spinous process of L3 to the superior aspect of L5 was carried expose at its edges debrided the muscle attachments using a Leskell. Time-out procedure was called and correct. Skin in the midline between L2 and L5 was then infiltrated with local anesthesia, marcaine 1/2% 1:1 exparel 1.3% total 20 cc used. Incision was then made  extending from L2-L5  through the skin and subcutaneous layers down to the patient's lumbodorsal fascia and spinous processes. The incision then carried sharply excising the supraspinous ligament and then continuing the lateral aspect of the spinous processes of  L3,L4 and L5. Cobb elevator used to carefully elevate the paralumbar muscles off of the posterior elements using electrocautery carefully drilled bleeding and perform dissection of the muscle tissues of the preserving the facet capsule at the L3-4. Continuing the exposure out laterally to expose the lateral margin of the facet joint line at L4-5 and L3-4. Incision was  carried in the midline down to the L5 level area bleeders controlled using electrocautery monopolar electrocautery.    C-arm fluoroscopy was then brought into the field and using C-arm fluoroscopy then a hole made into the medial aspect of the pedicle of L4 using a high speed burr observed in the pedicle using C arm at the 5 oclock position on the left L4 pedicle nerve probe initial entry was determined on fluoroscopy to be good position alignment so that a 4.11m tap was passed to 30 mm within the left L4 pedicle to a depth of nearly 45 mm observed on C-arm fluoroscopy to be beyond the midpoint of the lumbar vertebra and then position alignment within the left L4 pedicle this was then removed and the pedicle channel probed demonstrating patency no sign of rupture the cortex of the pedicle. Tapping with a 4.35 mm screw tap then 5 mm tap then a 6 mm and 759mtap a 7.0 mm x 45 mm screw was reserved for later placement on the left side pedicle at the L4 level. C-arm fluoroscopy was then brought into the field and using C-arm fluoroscopy then a hole made into the posterior medial aspect of the pedicle of right L4 observed in the pedicle using ball tipped nerve hook and hockey stick nerve probe initial entry was determined on fluoroscopy to be good position alignment so that 4.59m75map was then used to tap the right L4 pedicle to a depth of nearly 45 mm observed on C-arm fluoroscopy to be beyond the midpoint of the lumbar vertebra and then position alignment within the right L4 pedicle this was then removed and the pedicle channel probed demonstrating patency no sign of rupture the cortex of the pedicle. Tapping with a 5 mm screw tap then tapping with a 6.0 mm and 7.59mm57mp, a 7.0 mm x 45 mm screw was placeed.C-arm fluoroscopy was then brought into the field and using C-arm fluoroscopy then a hole made into the posterior and medial aspect of the left pedicle of L5 observed in the pedicle using ball tipped nerve hook and  hockey stick nerve probe initial entry was determined on fluoroscopy to be good position alignment so that a 4.35 mm tap was then used to tap the left L4 pedicle to a depth of nearly 45 mm observed on C-arm fluoroscopy to be beyond the posterior one third of the lumbar vertebra and good position alignment within the left L5 pedicle this was then removed and the pedicle channel probed demonstrating patency no sign of rupture the cortex of the pedicle. Tapping with a 4.0 mm screw tap then a 5.0 mm tap then tapping with a 6.0 mm and 7.59mm 759m a 7.59mm x23m mm screw was saved for later placement on the left side at the L5 level. The pedicle channel of L5 on the left probed demonstrating patency no sign of rupture the cortex of the pedicle.C-arm fluoroscopy was then brought into the field and using C-arm fluoroscopy then a hole made into the posterior and medial aspect of the right pedicle of L5 observed in the pedicle using ball tipped nerve hook and hockey stick nerve  probe initial entry was determined on fluoroscopy to be good position alignment so that a 4.42m tap was then used to tap the right L5 pedicle to a depth of nearly 45 mm observed on C-arm fluoroscopy to be beyond the posterior one third of the lumbar vertebra and good position alignment within the right L5 pedicle this was then removed and the pedicle channel probed demonstrating patency no sign of rupture the cortex of the pedicle. Tapping with a 4.348mscrew tap then up to a 54m92map then 7.0mm66m45 mm screw was placed on the right side at the L5 level. The pedicle channel of L5 on the right probed demonstrating patency no sign of rupture the cortex of the pedicle. Viper screw for fixation of this level was measured as 7.0 mm x 45 mm screw inserted.  Flow Seal was used for hemostasis of the left L4 and L5 pedicle screw holes.   Spinous processes of  L4 inferior 50%and superior 20% of L5 were then resected down to the base the lamina at each segment.   Leksell rongeur used to resect inferior aspect of the lamina on the left side at the L4 level and partially on the right side at L4 The left medial 40% of the facets of L4-5 were resected in order to decompress the left and right side of the lumbar thecal sac at L4-5 and decompress the bilateral  L4 and L5 neuroforamen. Osteotomes and 2mm 654m 3mm k3misons were used for this portion of the decompression. Similarly the right side decompression was carried out but  Near complete facetectomy was perform on the left at L4-5 to provide for exposure of the left side L4-5 neuroforamen for ease of placement of TLIF (transforaminal lumbar interbody fusion) at the L4 level inferior portions of the lamina and pars were also resected first beginning with the Leksell rongeur and osteotomes and then resecting using 2 and 3 mm Kerrison. Continued laminectomy was carried out resecting the central portions of the lamina of L4 and upper L5 performing foraminotomies on the right side at the L4 and L5 levels. The inferior articular process  L4 was resected on the right side.  A large amount of hypertrophic ligmentum flavum was found impressing on the right lateral recesses at L4-5 and narrowing the respective L4 and L5 neuroforamen.  Loupe magnification and headlight were used during this portion procedure. Then the operating room microscope sterilely draped and brought into the field.  Attention then turned to placement of the transforaminal lumbar interbody fusion cage.  Bleeding controlled using bipolar electrocautery thrombin soaked gel cottonoids. Then turned to the left L4-5 level the exposure the posterior lateral aspect this was carried out using a Penfield 4 bipolar electrocautery to control small bleeders present. Derricho retractor used to retract the thecal sac and L4 nerve root a 15 blade scalpel was used to incise posterior lateral aspect of the left L4-5 disc the disc space at this level showed a rather severe narrowing  posteriorly was more open anteriorly so that an osteotome again was used to resect a small portion the posterior superior lip of the vertebral body at L5  in order to gain ease of access into the L4-5 disc space. The space was debrided of degenerative disc material using pituitary along root the entire disc space was then debrided of degenerative disc material using pituitary rongeurs curettage down to bleeding bone endplates. 54mm an62m mm shavers were used to debride the disc space and pituitary ronguers  used to remove the loosened debris. This space was then carefully assess using spacers  a 10.42m trial cage provided the best fit, the Depuy concordeLIFT cage [id number] 2742ms chosen so that the permanent LIFT 9.0 mm cage by 27 mm concorde cage packed with local bone graft and vivigen and cancellous allograft chips were placed into the intervertebral disc space. The posterior intervertebral disc space was then packed with autogenous local bone graft that been harvested from the central laminectomy and vivigen bone graft, allograft. Bleeding controlled using bipolar electrocautery.  Observed on C-arm fluoroscopy to be in good position alignment. The cage at L4-5 was placed anteriorly as best as possible the correct patient's lordosis. The cage was then raised with the inserted screw driver and the LIFT mechanism deployed. The cage was then filled with vivigen bone graft. With this then the transforaminal lumbar interbody fusion portion of the case was completed bleeders were controlled using bipolar electrocautery thrombin-soaked Gelfoam were appropriate.Decortication of the right facet joint carried out at L4-5. These were packed with cancellous local bone graft.  The 2 viper corticofixation screws on the left were each placed and then each fastener carefully aligned  to allow for placement of rods. The left rod was a precontoured 40 mm rod. This was then placed into the pedicle screws on the left extending from L4-L5  each of the caps were carefully placed loosely tightened.Caps onto the left L4 fastener was tightened to 80 foot lbs. Across the right side a precontoured 27m29mtanium rod was placed into the L4 and L5  screw fasteners and the upper cap tightened to 80 foot lbs., compression was obtained on the right side between L5 and L4 compressing between the fasteners and tightening the screw caps 85 pounds.Copious amounts of saline solution this was done throughout the case. Cell Saver was used during the case. No cell saver blood returned to the patient.  Hockey stick neuroprobe was used to probe the neuroforamen bilateral L4 and L5 these were determined to be well decompressed. Permanent C-arm images were obtained in AP and lateral plane and oblique planes. Remaining local bone graft was then applied along both lateral posterior lateral region extending from right L4 to L5 facet bed.Gelfoam was then removed spinal canal. The lumbodorsal musculature carefully exam debrided of any devitalized tissue following removal of self retaining retractors were the bleeders were controlled using electrocautery and the area dorsal lumbar muscle were then approximated in the midline with interrupted #1 Vicryl sutures loose the dorsal fascia was reattached to the spinous process of L3  superiorly and L5  inferiorly this was done with #1 Vicryl sutures. Subcutaneous layers then approximated using interrupted 0 Vicryl sutures and 2-0 Vicryl sutures. Skin was closed with a running subcutaneous stitch of 4-0 Vicryl Dermabond was applied then MedPlex bandage. All instrument and sponge counts were correct. The patient was then returned to a supine position on her bed reactivated extubated and returned to the recovery room in satisfactory condition.    JameBenjiman CoreC perform the duties of assistant surgeon during this case. He was present from the beginning of the case to the end of the case assisting in transfer the patient from his  stretcher to the OR table and back to the stretcher at the end of the case. Assisted in careful retraction and suction of the laminectomy site delicate neural structures operating under the operating room microscope. He performed closure of the incision from the fascia to the skin applying the dressing.  ME@ 08/20/2021,12:34 PM

## 2021-08-20 NOTE — Interval H&P Note (Signed)
History and Physical Interval Note:  08/20/2021 7:43 AM  [name]  has presented today for surgery, with the diagnosis of L4-5 central disc herniation with radiculopathy, degenerative disc disease, Grade I spondylolisthesis.  The various methods of treatment have been discussed with the patient and family. After consideration of risks, benefits and other options for treatment, the patient has consented to  Procedure(s): RIGHT L4-5 TRANSFORAMINAL LUMBAR INTERBODY FUSION WITH RODS, SCREWS AND CAGE, LOCAL BONE GRAFT, ALLOGRAFT BONE GRAFT, VIVIGEN (N/A) as a surgical intervention.  The patient's history has been reviewed, patient examined, no change in status, stable for surgery.  I have reviewed the patient's chart and labs.  Questions were answered to the patient's satisfaction.     [name]

## 2021-08-21 DIAGNOSIS — M4316 Spondylolisthesis, lumbar region: Secondary | ICD-10-CM | POA: Diagnosis not present

## 2021-08-21 LAB — BASIC METABOLIC PANEL
Anion gap: 7 (ref 5–15)
Anion gap: 7 (ref 5–15)
BUN: 15 mg/dL (ref 6–20)
BUN: 18 mg/dL (ref 6–20)
CO2: 22 mmol/L (ref 22–32)
CO2: 23 mmol/L (ref 22–32)
Calcium: 8.1 mg/dL — ABNORMAL LOW (ref 8.9–10.3)
Calcium: 7.9 mg/dL — ABNORMAL LOW (ref 8.9–10.3)
Chloride: 103 mmol/L (ref 98–111)
Chloride: 106 mmol/L (ref 98–111)
Creatinine, Ser: 1.35 mg/dL — ABNORMAL HIGH (ref 0.44–1.00)
Creatinine, Ser: 1.52 mg/dL — ABNORMAL HIGH (ref 0.44–1.00)
GFR, Estimated: 48 mL/min — ABNORMAL LOW (ref >60)
GFR, Estimated: 41 mL/min — ABNORMAL LOW (ref >60)
Glucose, Bld: 291 mg/dL — ABNORMAL HIGH (ref 70–99)
Glucose, Bld: 166 mg/dL — ABNORMAL HIGH (ref 70–99)
Potassium: 4.4 mmol/L (ref 3.5–5.1)
Potassium: 3.9 mmol/L (ref 3.5–5.1)
Sodium: 132 mmol/L — ABNORMAL LOW (ref 135–145)
Sodium: 136 mmol/L (ref 135–145)

## 2021-08-21 LAB — CBC
HCT: 28.9 % — ABNORMAL LOW (ref 36.0–46.0)
Hemoglobin: 9.4 g/dL — ABNORMAL LOW (ref 12.0–15.0)
MCH: 28.7 pg (ref 26.0–34.0)
MCHC: 32.5 g/dL (ref 30.0–36.0)
MCV: 88.1 fL (ref 80.0–100.0)
Platelets: 298 10*3/uL (ref 150–400)
RBC: 3.28 MIL/uL — ABNORMAL LOW (ref 3.87–5.11)
RDW: 13.8 % (ref 11.5–15.5)
WBC: 13.7 10*3/uL — ABNORMAL HIGH (ref 4.0–10.5)
nRBC: 0 % (ref 0.0–0.2)

## 2021-08-21 LAB — CBC WITH DIFFERENTIAL/PLATELET
Abs Immature Granulocytes: 0.06 10*3/uL (ref 0.00–0.07)
Basophils Absolute: 0 10*3/uL (ref 0.0–0.1)
Basophils Relative: 0 %
Eosinophils Absolute: 0 10*3/uL (ref 0.0–0.5)
Eosinophils Relative: 0 %
HCT: 26.8 % — ABNORMAL LOW (ref 36.0–46.0)
Hemoglobin: 8.7 g/dL — ABNORMAL LOW (ref 12.0–15.0)
Immature Granulocytes: 1 %
Lymphocytes Relative: 22 %
Lymphs Abs: 2.9 10*3/uL (ref 0.7–4.0)
MCH: 28.9 pg (ref 26.0–34.0)
MCHC: 32.5 g/dL (ref 30.0–36.0)
MCV: 89 fL (ref 80.0–100.0)
Monocytes Absolute: 1.3 10*3/uL — ABNORMAL HIGH (ref 0.1–1.0)
Monocytes Relative: 10 %
Neutro Abs: 9 10*3/uL — ABNORMAL HIGH (ref 1.7–7.7)
Neutrophils Relative %: 67 %
Platelets: 251 10*3/uL (ref 150–400)
RBC: 3.01 MIL/uL — ABNORMAL LOW (ref 3.87–5.11)
RDW: 14.1 % (ref 11.5–15.5)
WBC: 13.3 10*3/uL — ABNORMAL HIGH (ref 4.0–10.5)
nRBC: 0 % (ref 0.0–0.2)

## 2021-08-21 LAB — GLUCOSE, CAPILLARY
Glucose-Capillary: 245 mg/dL — ABNORMAL HIGH (ref 70–99)
Glucose-Capillary: 274 mg/dL — ABNORMAL HIGH (ref 70–99)
Glucose-Capillary: 204 mg/dL — ABNORMAL HIGH (ref 70–99)
Glucose-Capillary: 155 mg/dL — ABNORMAL HIGH (ref 70–99)

## 2021-08-21 LAB — HEMOGLOBIN A1C
Hgb A1c MFr Bld: 7.4 % — ABNORMAL HIGH (ref 4.8–5.6)
Mean Plasma Glucose: 166 mg/dL

## 2021-08-21 LAB — CORTISOL: Cortisol, Plasma: 6.9 ug/dL

## 2021-08-21 MED ORDER — OXYCODONE HCL ER 10 MG PO T12A
10.0000 mg | EXTENDED_RELEASE_TABLET | Freq: Two times a day (BID) | ORAL | Status: DC
  Administered 2021-08-22 – 2021-08-23 (×3): 10 mg via ORAL
  Filled 2021-08-21 (×3): qty 1

## 2021-08-21 MED ORDER — LACTATED RINGERS IV BOLUS
500.0000 mL | Freq: Once | INTRAVENOUS | Status: AC
  Administered 2021-08-21: 500 mL via INTRAVENOUS

## 2021-08-21 MED ORDER — HYDROMORPHONE HCL 1 MG/ML IJ SOLN
0.5000 mg | INTRAMUSCULAR | Status: DC | PRN
  Filled 2021-08-21: qty 0.5

## 2021-08-21 MED ORDER — PANTOPRAZOLE SODIUM 40 MG PO TBEC
40.0000 mg | DELAYED_RELEASE_TABLET | Freq: Every day | ORAL | Status: DC
  Administered 2021-08-21 – 2021-08-25 (×5): 40 mg via ORAL
  Filled 2021-08-21 (×5): qty 1

## 2021-08-21 MED ORDER — KETOROLAC TROMETHAMINE 30 MG/ML IJ SOLN
30.0000 mg | Freq: Once | INTRAMUSCULAR | Status: AC
  Administered 2021-08-21: 30 mg via INTRAVENOUS
  Filled 2021-08-21: qty 1

## 2021-08-21 MED ORDER — OXYCODONE HCL 5 MG PO TABS
10.0000 mg | ORAL_TABLET | ORAL | Status: DC | PRN
  Administered 2021-08-22 – 2021-08-26 (×19): 10 mg via ORAL
  Filled 2021-08-21 (×20): qty 2

## 2021-08-21 MED ORDER — SODIUM CHLORIDE 0.9 % IV BOLUS
500.0000 mL | Freq: Once | INTRAVENOUS | Status: AC
  Administered 2021-08-21: 500 mL via INTRAVENOUS

## 2021-08-21 MED FILL — Thrombin (Recombinant) For Soln 20000 Unit: CUTANEOUS | Qty: 1 | Status: AC

## 2021-08-21 NOTE — Evaluation (Signed)
Occupational Therapy Evaluation Patient Details Name: [name] MRN: [id number] DOB: [birth year] Today's Date: 08/21/2021   History of Present Illness 50 y/o female admitted following R L4-5 transforaminal lumbar fusion on 11/29. PMH: HTN, COPD, depression, DM type 2, subglottic stenosis and tracheal stenosis s/p tracheostomy (current)   Clinical Impression   Patient is s/p back L4-5 surgery resulting in functional limitations due to the deficits listed below (see OT problem list). Pt very limited by pain and reports spasms during session. Pt with decreased BP noted during session. RN notified and checking on medications allowed. Pt progressed from bed to chair and back to bed due to pain.  Patient will benefit from skilled OT acutely to increase independence and safety with ADLS to allow discharge [place].       Recommendations for follow up therapy are one component of a multi-disciplinary discharge planning process, led by the attending physician.  Recommendations may be updated based on patient status, additional functional criteria and insurance authorization.   Follow Up Recommendations  Home health OT    Assistance Recommended at Discharge Intermittent Supervision/Assistance  Functional Status Assessment  Patient has had a recent decline in their functional status and demonstrates the ability to make significant improvements in function in a reasonable and predictable amount of time.  Equipment Recommendations  BSC/3in1;Other (comment) (RW baratric due to body habitus and height)    Recommendations for Other Services       Precautions / Restrictions Precautions Precautions: Fall;Back Precaution Booklet Issued: No Precaution Comments: old trach; verbally reviewed back precautions, pt has a small orange carrot shape to occlude trach for speaking Required Braces or Orthoses: Spinal Brace Spinal Brace: Lumbar corset;Applied in sitting position      Mobility  Bed Mobility Overal bed mobility: Needs Assistance Bed Mobility: Sidelying to Sit;Rolling;Sit to Sidelying Rolling: Min assist Sidelying to sit: Min assist;+2 for physical assistance     Sit to sidelying: Min assist;+2 for physical assistance General bed mobility comments: minA to bring LEs up to initiate rolling. MinA+2 for trunk elevation and to return to supine    Transfers Overall transfer level: Needs assistance Equipment used: Rolling walker (2 wheels) Transfers: Sit to/from Stand Sit to Stand: Max assist;+2 physical assistance;+2 safety/equipment;From elevated surface           General transfer comment: maxA+2 to rise from elevated EOB and recliner with cues for hand placement for ascent and descent. Assist to guide hands to arms of recliner to sit. pt with +3 Mod (A) to transfer from recliner to static stand      Balance Overall balance assessment: Needs assistance Sitting-balance support: Bilateral upper extremity supported;Feet supported Sitting balance-[name]: Fair     Standing balance support: Bilateral upper extremity supported;During functional activity Standing balance-[name]: Poor Standing balance comment: reliant on UE support and external assist                           ADL either performed or assessed with clinical judgement   ADL Overall ADL's : Needs assistance/impaired Eating/Feeding: Set up;Sitting   Grooming: Set up;Sitting   Upper Body Bathing: Moderate assistance   Lower Body Bathing: Maximal assistance   Upper Body Dressing : Moderate assistance   Lower Body Dressing: Maximal assistance   Toilet Transfer: +2 for physical assistance;Moderate assistance;BSC/3in1 (simulated)           Functional mobility during ADLs: +2 for physical assistance;Minimal assistance  Vision Baseline Vision/History: 1 Wears glasses Additional Comments: reading     Perception     Praxis      Pertinent Vitals/Pain Pain  Assessment: Faces Faces Pain Scale: Hurts whole lot Pain Location: back; R LE Pain Descriptors / Indicators: Grimacing;Guarding Pain Intervention(s): Monitored during session;Limited activity within patient's tolerance;Repositioned     Hand Dominance     Extremity/Trunk Assessment Upper Extremity Assessment Upper Extremity Assessment: Overall WFL for tasks assessed   Lower Extremity Assessment Lower Extremity Assessment: Defer to PT evaluation   Cervical / Trunk Assessment Cervical / Trunk Assessment: Back Surgery   Communication Communication Communication: Tracheostomy   Cognition Arousal/Alertness: Awake/alert Behavior During Therapy: WFL for tasks assessed/performed Overall Cognitive Status: Difficult to assess                                 General Comments: follows all commands, seems [facility]     General Comments  Obtained BPs due to recent soft BPs. Supine: 128/64, sitting: 115/80, standing after mobility 109/79, supine at end of session 91/55    Exercises     Shoulder Instructions      Home Living Family/patient expects to be discharged to:: Private residence Living Arrangements: Spouse/significant other Available Help at Discharge: Family Type of Home: Apartment Home Access: Stairs to enter Technical brewer of Steps: 12 Entrance Stairs-Rails: Right Home Layout: One level     Bathroom Shower/Tub: Corporate investment banker: Standard     Home Equipment: Cane - single point;Shower seat;Hand held shower head;Adaptive equipment Adaptive Equipment: Reacher        Prior Functioning/Environment Prior Level of Function : Needs assist       Physical Assist : ADLs (physical)   ADLs (physical): Bathing Mobility Comments: rest breaks required for mobility/stairs. Uses cane every day          OT Problem List: Pain;Obesity;Cardiopulmonary status limiting activity;Decreased knowledge of precautions;Decreased knowledge of  use of DME or AE;Decreased safety awareness;Decreased cognition;Impaired balance (sitting and/or standing);Decreased activity tolerance;Decreased strength      OT Treatment/Interventions: Self-care/ADL training;Therapeutic exercise;Neuromuscular education;Energy conservation;DME and/or AE instruction;Manual therapy;Modalities;Therapeutic activities;Patient/family education;Balance training    OT Goals(Current goals can be found in the care plan section) Acute Rehab OT Goals Patient Stated Goal: to get pain medication from RN OT Goal Formulation: With patient Time For Goal Achievement: 09/04/21 Potential to Achieve Goals: Good  OT Frequency: Min 2X/week   Barriers to D/C:            Co-evaluation PT/OT/SLP Co-Evaluation/Treatment: Yes Reason for Co-Treatment: For patient/therapist safety;To address functional/ADL transfers PT goals addressed during session: Mobility/safety with mobility;Balance;Proper use of DME OT goals addressed during session: ADL's and self-care      AM-PAC OT "6 Clicks" Daily Activity     Outcome Measure Help from another person eating meals?: A Little Help from another person taking care of personal grooming?: A Little Help from another person toileting, which includes using toliet, bedpan, or urinal?: A Little Help from another person bathing (including washing, rinsing, drying)?: A Lot Help from another person to put on and taking off regular upper body clothing?: A Little Help from another person to put on and taking off regular lower body clothing?: A Lot 6 Click Score: 16   End of Session Equipment Utilized During Treatment: Back brace Nurse Communication: Mobility status;Precautions  Activity Tolerance: Patient limited by pain Patient left: in bed;with call bell/phone within reach;with  bed alarm set;with SCD's reapplied  OT Visit Diagnosis: Unsteadiness on feet (R26.81);Muscle weakness (generalized) (M62.81);Pain                Time: 1009-1050 OT  Time Calculation (min): 41 min Charges:  OT General Charges $OT Visit: 1 Visit OT Evaluation $OT Eval Moderate Complexity: 1 Mod   Brynn, OTR/L  Acute Rehabilitation Services Pager: [phone] Office: [phone] .   Jeri Modena 08/21/2021, 1:48 PM

## 2021-08-21 NOTE — Progress Notes (Signed)
     Subjective: 1 Day Post-Op Procedure(s) (LRB): RIGHT L4-5 TRANSFORAMINAL LUMBAR INTERBODY FUSION WITH RODS, SCREWS AND CAGE, LOCAL BONE GRAFT, ALLOGRAFT BONE GRAFT, VIVIGEN (N/A) Complains of pain that is severe. Was taking oxycodone 10 mg at least 32 times daily preop, likely she is opioid tolerant will increase her narcotics but due to hypotension will try toradol first and change to oxycodone which she was using preoperatively and oxycontin. Will ask for a [facility]  consult due to low BP despite fluid resuscitation and increased IVF.  Patient reports pain as moderate.    Objective:   VITALS:  Temp:  [98.1 F (36.7 C)-99.2 F (37.3 C)] 98.8 F (37.1 C) (11/30 0735) Pulse Rate:  [70-110] 100 (11/30 1153) Resp:  [14-20] 20 (11/30 1153) BP: (85-151)/(62-92) 85/62 (11/30 0815) SpO2:  [94 %-100 %] 98 % (11/30 1153) FiO2 (%):  [28 %] 28 % (11/30 0343)  Neurologically intact ABD soft Neurovascular intact Sensation intact distally Intact pulses distally Dorsiflexion/Plantar flexion intact   LABS Recent Labs    08/21/21 0329  HGB 9.4*  WBC 13.7*  PLT 298   Recent Labs    08/21/21 0329  NA 132*  K 4.4  CL 103  CO2 22  BUN 15  CREATININE 1.35*  GLUCOSE 291*   No results for input(s): LABPT, INR in the last 72 hours.   Assessment/Plan: 1 Day Post-Op Procedure(s) (LRB): RIGHT L4-5 TRANSFORAMINAL LUMBAR INTERBODY FUSION WITH RODS, SCREWS AND CAGE, LOCAL BONE GRAFT, ALLOGRAFT BONE GRAFT, VIVIGEN (N/A) Anemia due to blood loss perioperatively. Hypotension likely hypovolemia though blood loss was not very significant.  Advance diet PT held today due to low blood pressures. Bolus one time and will rebolus with 500 cc of NS and keep IV rate at 125.  She has received steroid exposure in the years prior so that adrenal  Crisis could cause a similar picture.    [name] 08/21/2021, 2:06 PM Patient ID: [name], female   DOB: 1971/06/09,  50 y.o.   MRN: [id number]

## 2021-08-21 NOTE — Evaluation (Signed)
Physical Therapy Evaluation Patient Details Name: [name] MRN: [id number] DOB: 1971/09/22 Today's Date: 08/21/2021  History of Present Illness  50 y/o female admitted following R L4-5 transforaminal lumbar fusion on 11/29. PMH: HTN, COPD, depression, DM type 2, subglottic stenosis and tracheal stenosis s/p tracheostomy (current)  Clinical Impression  Patient admitted following above procedure. Patient presents with generalized weakness, decreased activity tolerance, impaired balance, and pain. Patient required maxA+2 for sit to stand transfer and minA+2 for short ambulation distance with RW. Patient with increased pain sitting in recliner so returned to supine for comfort. Educated patient on brace wear and back precautions. Watched BP during session due to recent soft BP. Patient will benefit from skilled PT services during acute stay to address listed deficits. Recommend HHPT at this time in hopes pain is management and mobility progresses.   Orthostatic BPs  Supine 128/64  Sitting 115/80  Standing after mobility 109/79  Supine at end of session 91/55          Recommendations for follow up therapy are one component of a multi-disciplinary discharge planning process, led by the attending physician.  Recommendations may be updated based on patient status, additional functional criteria and insurance authorization.  Follow Up Recommendations Home health PT    Assistance Recommended at Discharge Frequent or constant Supervision/Assistance  Functional Status Assessment Patient has had a recent decline in their functional status and demonstrates the ability to make significant improvements in function in a reasonable and predictable amount of time.  Equipment Recommendations  Rolling Radley Teston (2 wheels);BSC/3in1    Recommendations for Other Services       Precautions / Restrictions Precautions Precautions: Fall;Back Precaution Booklet Issued: No Precaution  Comments: old trach; verbally reviewed back precautions Required Braces or Orthoses: Spinal Brace Spinal Brace: Lumbar corset;Applied in sitting position      Mobility  Bed Mobility Overal bed mobility: Needs Assistance Bed Mobility: Sidelying to Sit;Rolling;Sit to Sidelying Rolling: Min assist Sidelying to sit: Min assist;+2 for physical assistance     Sit to sidelying: Min assist;+2 for physical assistance General bed mobility comments: minA to bring LEs up to initiate rolling. MinA+2 for trunk elevation and to return to supine    Transfers Overall transfer level: Needs assistance Equipment used: Rolling Jadyn Barge (2 wheels) Transfers: Sit to/from Stand Sit to Stand: Max assist;+2 physical assistance;+2 safety/equipment;From elevated surface           General transfer comment: maxA+2 to rise from elevated EOB and recliner with cues for hand placement for ascent and descent. Assist to guide hands to arms of recliner to sit    Ambulation/Gait Ambulation/Gait assistance: Min assist;+2 physical assistance Gait Distance (Feet): 5 Feet (x5') Assistive device: Rolling Swayzee Wadley (2 wheels) Gait Pattern/deviations: Step-to pattern;Decreased stride length;Wide base of support Gait velocity: decreased     General Gait Details: minA+2 for balance and Rw management. Patient with tendency to have feet outside of RW. Ambulated to recliner to rest. Patient with increased pain while sitting in recliner. ambulated back to bed  Stairs            Wheelchair Mobility    Modified Rankin (Stroke Patients Only)       Balance Overall balance assessment: Needs assistance Sitting-balance support: Bilateral upper extremity supported;Feet supported Sitting balance-[name]: Fair     Standing balance support: Bilateral upper extremity supported;During functional activity Standing balance-[name]: Poor Standing balance comment: reliant on UE support and external assist  Pertinent Vitals/Pain Pain Assessment: Faces Faces Pain Scale: Hurts whole lot Pain Location: back; R LE Pain Descriptors / Indicators: Grimacing;Guarding Pain Intervention(s): Monitored during session;Limited activity within patient's tolerance;Repositioned    Home Living Family/patient expects to be discharged to:: Private residence Living Arrangements: Spouse/significant other Available Help at Discharge: Family Type of Home: Apartment Home Access: Stairs to enter Entrance Stairs-Rails: Right Entrance Stairs-Number of Steps: 12   Home Layout: One level Home Equipment: Cane - single point;Shower seat;Hand held shower head;Adaptive equipment      Prior Function Prior Level of Function : Needs assist       Physical Assist : ADLs (physical)   ADLs (physical): Bathing Mobility Comments: rest breaks required for mobility/stairs. Uses cane every day       Hand Dominance        Extremity/Trunk Assessment   Upper Extremity Assessment Upper Extremity Assessment: Defer to OT evaluation    Lower Extremity Assessment Lower Extremity Assessment: Generalized weakness    Cervical / Trunk Assessment Cervical / Trunk Assessment: Back Surgery  Communication   Communication: Tracheostomy  Cognition Arousal/Alertness: Awake/alert Behavior During Therapy: WFL for tasks assessed/performed Overall Cognitive Status: Difficult to assess                                 General Comments: follows all commands, seems [facility]        General Comments General comments (skin integrity, edema, etc.): Obtained BPs due to recent soft BPs. Supine: 128/64, sitting: 115/80, standing after mobility 109/79, supine at end of session 91/55    Exercises     Assessment/Plan    PT Assessment Patient needs continued PT services  PT Problem List Decreased strength;Decreased activity tolerance;Decreased balance;Decreased mobility;Decreased safety  awareness;Decreased knowledge of precautions;Decreased knowledge of use of DME;Pain       PT Treatment Interventions DME instruction;Gait training;Functional mobility training;Stair training;Therapeutic activities;Therapeutic exercise;Balance training;Patient/family education    PT Goals (Current goals can be found in the Care Plan section)  Acute Rehab PT Goals Patient Stated Goal: reduce pain PT Goal Formulation: With patient Time For Goal Achievement: 09/04/21 Potential to Achieve Goals: Good    Frequency Min 5X/week   Barriers to discharge        Co-evaluation PT/OT/SLP Co-Evaluation/Treatment: Yes Reason for Co-Treatment: For patient/therapist safety;To address functional/ADL transfers PT goals addressed during session: Mobility/safety with mobility;Balance;Proper use of DME         AM-PAC PT "6 Clicks" Mobility  Outcome Measure Help needed turning from your back to your side while in a flat bed without using bedrails?: A Little Help needed moving from lying on your back to sitting on the side of a flat bed without using bedrails?: Total Help needed moving to and from a bed to a chair (including a wheelchair)?: Total Help needed standing up from a chair using your arms (e.g., wheelchair or bedside chair)?: Total Help needed to walk in hospital room?: Total Help needed climbing 3-5 steps with a railing? : Total 6 Click Score: 8    End of Session Equipment Utilized During Treatment: Back brace Activity Tolerance: Patient limited by pain Patient left: in bed;with call bell/phone within reach Nurse Communication: Mobility status;Patient requests pain meds PT Visit Diagnosis: Unsteadiness on feet (R26.81);Muscle weakness (generalized) (M62.81);Difficulty in walking, not elsewhere classified (R26.2)    Time: 1009-1050 PT Time Calculation (min) (ACUTE ONLY): 41 min   Charges:   PT Evaluation $PT Eval Moderate  Complexity: 1 Mod PT Treatments $Gait Training: 8-22  mins        Remmi Armenteros A. Gilford Rile PT, DPT Acute Rehabilitation Services Pager [phone] Office [phone]   [name] 08/21/2021, 12:44 PM

## 2021-08-21 NOTE — TOC Initial Note (Signed)
Transition of Care [facility]) - Initial/Assessment Note    Patient Details  Name: [name] MRN: [id number] Date of Birth: 07-Nov-1970  Transition [facility]) CM/SW Contact:    Brittney Collet, RN Phone Number: 08/21/2021, 1:44 PM  Clinical Narrative:               [name] w patient at bedside. She states that she is from home with spouse. She states that she has needed DME at home except for RW, which has now been ordered through [employer] to be delivered to room prior to DC. She states that she prefers [facility] for [facility] services, referral made to liaison, awaiting response.   Expected Discharge Plan: Home/Self Care Barriers to Discharge: Continued Medical Work up   Patient Goals and CMS Choice Patient states their goals for this hospitalization and ongoing recovery are:: to go home CMS Medicare.gov Compare Post Acute Care list provided to:: Patient Choice offered to / list presented to : Patient  Expected Discharge Plan and Services Expected Discharge Plan: Home/Self Care   Discharge Planning Services: CM Consult Post Acute Care Choice: Durable Medical Equipment, Home Health Living arrangements for the past 2 months: Apartment                 DME Arranged: Walker rolling DME Agency: [employer] Date DME Agency Contacted: 08/21/21 Time DME Agency Contacted: [id number] Representative spoke with at DME Agency: [name] HH Arranged: PT [place]: [place] [facility] Date [place]: 08/21/21 Time [place]: 1344 Representative spoke with at [place]: [name] (pending)  Prior Living Arrangements/Services Living arrangements for the past 2 months: Apartment Lives with:: Relatives              Current home services: DME    Activities of Daily Living Home Assistive Devices/Equipment: Cane (specify quad or straight), CBG Meter ADL Screening (condition at time of admission) Patient's cognitive ability adequate to safely complete daily  activities?: Yes Is the patient deaf or have difficulty hearing?: No Does the patient have difficulty seeing, even when wearing glasses/contacts?: No Does the patient have difficulty concentrating, remembering, or making decisions?: No Patient able to express need for assistance with ADLs?: Yes Does the patient have difficulty dressing or bathing?: No Independently performs ADLs?: Yes (appropriate for developmental age) Does the patient have difficulty walking or climbing stairs?: No Weakness of Legs: Both Weakness of Arms/Hands: None  Permission Sought/Granted                  Emotional Assessment              Admission diagnosis:  Fusion of spine of lumbar region [M43.26] Patient Active Problem List   Diagnosis Date Noted   Fusion of spine of lumbar region 08/20/2021   Spondylolisthesis, lumbar region 08/20/2021    Class: Chronic   Chronic respiratory failure with hypoxia ([place]) 06/20/2021   Preop pulmonary/respiratory exam 06/20/2021   Abdominal pain, epigastric 11/28/2020   Nausea and vomiting 11/28/2020   COVID-19 09/16/2020   Dyspnea 08/26/2020   Rotator cuff tear 07/28/2019   Insulin dependent type 2 diabetes mellitus ([place]) 04/12/2019   Chest pain of uncertain etiology 69/45/0388   Lactic acidosis 03/22/2019   Chronic right shoulder pain    Community acquired pneumonia 10/25/2018   Asthma, chronic, unspecified asthma severity, with acute exacerbation 10/25/2018   Acute respiratory failure with hypoxia and hypercapnia ([place]) 10/23/2018   Acute pain of right shoulder due to trauma 10/05/2018   Chronic  bilateral low back pain without sciatica 10/05/2018   Dysphonia 10/05/2018   Severe asthma with exacerbation 10/05/2018   Rotator cuff arthropathy, right 06/15/2018   AKI (acute kidney injury) ([place]) 03/06/2018   Carpal tunnel syndrome 01/18/2018   Heart failure ([place]) 05/20/2017   Normocytic anemia 05/06/2017   Tracheostomy dependent ([place]) 03/26/2017    Dysphagia 03/26/2017   Status post tracheostomy ([place]) 03/14/2017   Klebsiella cystitis 03/14/2017   Morbid obesity ([place]) 03/13/2017   Subglottic stenosis 03/06/2017   Asthma 03/06/2017   Chronic diastolic heart failure ([place]) 03/06/2017   Tracheal stenosis 03/04/2017   Acute blood loss anemia    Generalized anxiety disorder    Chronic pain syndrome    GERD (gastroesophageal reflux disease) 02/22/2016   Depression 02/22/2016   Migraine 01/30/2015   Bulging lumbar disc 09/05/2013   Essential hypertension 09/05/2013   Allergic rhinitis, seasonal 08/11/2012   Chronic cough 08/11/2012   OSA (obstructive sleep apnea) 12/04/2011   PCP:  Brittney Rakes, MD Pharmacy:   [name], [place] [address] Phone: [phone] Fax: [phone]  [place] ([place] - [address] [place] [address] Phone: [phone] Fax: [phone]     Social Determinants of Health (SDOH) Interventions    Readmission Risk Interventions No flowsheet data found.

## 2021-08-21 NOTE — Consult Note (Signed)
History and Physical    [name] ZOX:[id number] DOB: 02/11/1971 DOA: 08/20/2021  PCP: [name], MD   Reason for consult: Hypotension postoperatively  HPI: [name] is a 49 y.o. female with medical history significant of diabetes type 1, hypertension, sleep apnea, COPD/asthma, depression, status post tracheostomy secondary to previous intubation related tracheal injury, subglottic stenosis on 2 L nasal cannula at night. Patient admitted under the orthopedic team for intractable back pain and right lower extremity radiculopathy.  Patient underwent right L4-5 transforaminal lumbar interbody fusion with rods screws and cage with a local bone graft allograft bone graft on 08/20/2021.  Patient tolerated procedure well but had post operative hypotension thought to be hypovolemic in nature given perioperative status versus narcotic induced given ongoing uncontrolled pain.  Assessment/Plan  Hypotension, likely multifactorial Likely hypovolemia, rule out concurrent medication induced hypotension -Baseline hypertensive on lisinopril and Lasix -although no reported history of heart failure -Borderline hypotension postoperatively and the following day likely in the setting of poor p.o. intake -Status post bolus and increased IV fluids per orthopedics which is certainly reasonable -Will hold lisinopril and furosemide in the setting of presumed hypovolemia -Possible medication induced hypotension in the setting of increased narcotic requirements perioperatively -we will attempt to balance patient's pain control with hypotension -No clear signs for infection, patient does not appear septic; continue to follow closely  Hypovolemic hyponatremia, AKI without history of CKD -Likely secondary to above, continue IV fluids, repeat a.m. labs  Diabetes type 1, well controlled -Continue sliding scale insulin, Premeal and long-acting her baseline home needs Lab Results   Component Value Date   HGBA1C 7.2 (A) 07/26/2021   Sleep apnea 2 L nasal cannula at bedtime COPD/asthma, chronic tracheostomy status post previous intubation related tracheal injury and subglottic stenosis -Continue home inhalers as necessary -Continue supplemental oxygen at night at 2 L  Intractable back pain and right lower extremity radiculopathy, POA Status post lumbar fusion 08/20/2021 Defer to orthopedic surgery  Anxiety/depression -Continue Wellbutrin, Zoloft, Topamax   DVT prophylaxis: Per primary Code Status: Full Family Communication: None present Dispo: The patient is from: Home              Anticipated d/c is to: Home              Anticipated d/c date is: Per primary  Consultants:  We are  Procedures:  L4-5 fusion 08/20/2021   Past Medical History:  Diagnosis Date   Allergy    Anxiety    Arthritis    Asthma    Chronic back pain    Chronic chest pain    resolved, no problems since 2019 per patient 07/27/19   COPD (chronic obstructive pulmonary disease) ([place])    Depression    DM (diabetes mellitus) ([place])    INSULIN DEPENDENT - TYPE 1   Dyspnea    GERD (gastroesophageal reflux disease)    Headache(784.0)    History of hiatal hernia    Hyperlipidemia    no med, diet controlled   Hypertension    Hypokalemia    Pneumonia    Respiratory disease 05/2017   uses inhalers, neb tx prn, no oxygen   Sleep apnea    does not use CPAP due to trach   Tracheostomy in place [facility]) 02/2017    Past Surgical History:  Procedure Laterality Date   ABDOMINAL HYSTERECTOMY  2009   APPENDECTOMY     CESAREAN SECTION     x 3   CHOLECYSTECTOMY N/A 03/02/2014  Procedure: LAPAROSCOPIC CHOLECYSTECTOMY;  Surgeon: Joyice Faster. Cornett, MD;  Location: [place];  Service: General;  Laterality: N/A;   HERNIA REPAIR     PANENDOSCOPY N/A 03/04/2017   Procedure: PANENDOSCOPY WITH POSSIBLE FOREIGN BODY REMOVAL;  Surgeon: Jodi Marble, MD;  Location: [place];  Service: ENT;  Laterality:  N/A;   ROTATOR CUFF REPAIR Left    ROTATOR CUFF REPAIR Right 07/27/2019   SHOULDER ARTHROSCOPY WITH SUBACROMIAL DECOMPRESSION, ROTATOR CUFF REPAIR AND BICEP TENDON REPAIR Right 07/28/2019   Procedure: RIGHT SHOULDER ARTHROSCOP, MINI OPEN ROTATOR CUFF TEAR REPAIR,  BICEPS TENODESIS, DISTAL CLAVICLE EXCISION;  Surgeon: Meredith Pel, MD;  Location: [place];  Service: Orthopedics;  Laterality: Right;   TOOTH EXTRACTION N/A 02/22/2021   Procedure: DENTAL RESTORATION/EXTRACTIONS;  Surgeon: Diona Browner, DMD;  Location: [facility];  Service: Oral Surgery;  Laterality: N/A;   TRACHEOSTOMY  02/2017   TUBAL LIGATION     VESICOVAGINAL FISTULA CLOSURE W/ TAH  2009     reports that she quit smoking about 5 years ago. Her smoking use included cigarettes. She has a 12.50 pack-year smoking history. She has never used smokeless tobacco. She reports current alcohol use. She reports current drug use. Drug: Marijuana.  Allergies  Allergen Reactions   Reglan [Metoclopramide] Other (See Comments)    Panic attack    Family History  Problem Relation Age of Onset   Heart attack Mother    Stroke Mother    Diabetes Mother    Hypertension Mother    Arthritis Mother    Stroke Father    Asthma Sister    Hypertension Sister    Asthma Sister    Diabetes Sister    Asthma Brother    Seizures Brother    Asthma Brother    Diabetes Brother    Asthma Daughter    Asthma Son    Asthma Grandson    Colon cancer Neg Hx    Rectal cancer Neg Hx    Stomach cancer Neg Hx     Prior to Admission medications   Medication Sig Start Date End Date Taking? Authorizing Provider  albuterol (PROAIR HFA) 108 (90 Base) MCG/ACT inhaler Inhale 2 puffs into the lungs every 6 (six) hours as needed for wheezing or shortness of breath. 11/27/20  Yes [name], MD  albuterol (PROVENTIL) (2.5 MG/3ML) 0.083% nebulizer solution Take 3 mLs (2.5 mg total) by nebulization every 6 (six) hours as needed for wheezing or shortness of breath.  11/27/20  Yes [name], MD  atorvastatin (LIPITOR) 20 MG tablet Take 1 tablet (20 mg total) by mouth daily. 11/27/20  Yes [name], MD  buPROPion (WELLBUTRIN SR) 150 MG 12 hr tablet Take 1 tablet (150 mg total) by mouth 2 (two) times daily. 11/27/20  Yes [name], MD  cetirizine (ZYRTEC) 10 MG tablet Take 1 tablet (10 mg total) by mouth at bedtime. 03/06/21  Yes Spero Geralds, MD  dapagliflozin propanediol (FARXIGA) 5 MG TABS tablet Take 1 tablet (5 mg total) by mouth daily before breakfast. 11/30/20  Yes Newlin, Enobong, MD  furosemide (LASIX) 20 MG tablet Take 1 tablet (20 mg total) by mouth daily. Patient taking differently: Take 20 mg by mouth daily as needed for fluid or edema. 03/31/19  Yes [name], MD  gabapentin (NEURONTIN) 600 MG tablet Take 600 mg by mouth 3 (three) times daily.   Yes [provider]  hydrOXYzine (ATARAX/VISTARIL) 25 MG tablet Take 1 tablet (25 mg total) by mouth 3 (three) times  daily as needed for anxiety. 11/27/20  Yes Newlin, Charlane Ferretti, MD  insulin degludec (TRESIBA FLEXTOUCH) 200 UNIT/ML FlexTouch Pen Inject 156 Units into the skin daily. Patient taking differently: Inject 155 Units into the skin at bedtime. 07/26/21  Yes Renato Shin, MD  lisinopril (ZESTRIL) 20 MG tablet Take 20 mg by mouth daily. 02/23/21  Yes [provider]  Melatonin 3 MG TABS Take 3 mg by mouth at bedtime. 10/31/17  Yes [provider]  methocarbamol (ROBAXIN) 750 MG tablet Take 750 mg by mouth every 8 (eight) hours as needed for muscle spasms. 02/08/21  Yes [provider]  mometasone-formoterol (DULERA) 200-5 MCG/ACT AERO Inhale 2 puffs into the lungs in the morning and at bedtime. 07/11/20  Yes Newlin, Enobong, MD  ondansetron (ZOFRAN) 4 MG tablet TAKE 1 TABLET BY MOUTH EVERY 8 HOURS AS NEEDED FOR NAUSEA OR VOMITING 07/16/21  Yes Newlin, Enobong, MD  oxyCODONE-acetaminophen (PERCOCET) 10-325 MG tablet Take 1 tablet by mouth every 6 (six) hours.  08/08/21  Yes [provider]  pantoprazole (PROTONIX) 40 MG tablet Take 1 tablet (40 mg total) by mouth 2 (two) times daily. Patient taking differently: Take 40 mg by mouth daily as needed (heartburn). 04/30/21  Yes Newlin, Charlane Ferretti, MD  Semaglutide,0.25 or 0.5MG/DOS, (OZEMPIC, 0.25 OR 0.5 MG/DOSE,) 2 MG/1.5ML SOPN Inject 0.5 mg into the skin once a week. 04/10/21  Yes Renato Shin, MD  sertraline (ZOLOFT) 100 MG tablet Take 1 tablet (100 mg total) by mouth at bedtime. 11/27/20  Yes [name], MD  SUMAtriptan (IMITREX) 100 MG tablet At the onset of a migraine; may repeat in 2 hours. Max daily dose 271m 11/27/20  Yes Newlin, Enobong, MD  tiZANidine (ZANAFLEX) 4 MG tablet Take 1 tablet (4 mg total) by mouth every 8 (eight) hours as needed for muscle spasms. 05/23/21  Yes NCharlott Rakes MD  topiramate (TOPAMAX) 100 MG tablet Take 1.5 tablets (150 mg total) by mouth 2 (two) times daily. 02/28/21  Yes NCharlott Rakes MD  Blood Glucose Monitoring Suppl (ONETOUCH VERIO) w/Device KIT 1 each by Does not apply route daily. Use to monitor glucose levels daily; E11.9 01/25/21   ERenato Shin MD  diclofenac Sodium (VOLTAREN) 1 % GEL Apply 4 g topically 4 (four) times daily. Patient not taking: Reported on 08/09/2021 11/27/20   NCharlott Rakes MD  gabapentin (NEURONTIN) 300 MG capsule Take 2 capsules (600 mg total) by mouth 2 (two) times daily. Patient not taking: Reported on 08/09/2021 10/29/18   JDomenic Polite MD  glucose blood ([facility]) test strip Use to monitor glucose levels daily; E11.9 10/04/20   ERenato Shin MD  Insulin Pen Needle 31G X 8 MM MISC Inject 1 Units as directed daily. 07/30/18   [provider]  Misc. Devices MISC Nebulizer device. Diagnosis - Asthma 07/16/20   NCharlott Rakes MD  Misc. Devices [employer]  Diagnosis-low back pain 01/23/21   NCharlott Rakes MD  Misc. Devices MISC Shower chair 04/03/21   NCharlott Rakes MD  OneTouch Delica Lancets 383EMISC 1 each by Does  not apply route daily. Use to monitor glucose levels daily; E11.9 04/14/19   ERenato Shin MD  silver sulfADIAZINE (SILVADENE) 1 % cream Apply 1 application topically daily. Patient not taking: Reported on 08/09/2021 02/28/21   NCharlott Rakes MD  sucralfate (CARAFATE) 1 g tablet Take 1 tablet (1 g total) by mouth 4 (four) times daily -  with meals and at bedtime. Patient taking differently: Take 1 g by mouth at bedtime.  11/28/20   Zehr, Laban Emperor, PA-C  fluticasone-salmeterol (ADVAIR HFA) 230-21 MCG/ACT inhaler Inhale 2 puffs into the lungs 2 (two) times daily. 10/15/17 10/15/17  [name], MD    Physical Exam: Vitals:   08/21/21 0815 08/21/21 1153 08/21/21 1538 08/21/21 1553  BP: (!) 85/62  (!) 84/64   Pulse: (!) 102 100 99 99  Resp: 18 20 19 20   Temp:   98.9 F (37.2 C)   TempSrc:   Oral   SpO2: 94% 98% 96% 97%  Weight:      Height:        Constitutional: NAD, calm, comfortable Vitals:   08/21/21 0815 08/21/21 1153 08/21/21 1538 08/21/21 1553  BP: (!) 85/62  (!) 84/64   Pulse: (!) 102 100 99 99  Resp: 18 20 19 20   Temp:   98.9 F (37.2 C)   TempSrc:   Oral   SpO2: 94% 98% 96% 97%  Weight:      Height:       General:  Pleasantly resting in bed, No acute distress. HEENT:  Normocephalic atraumatic.  Sclerae nonicteric, noninjected.  Extraocular movements intact bilaterally. Neck:  Without mass or deformity.  Trachea is midline. Lungs:  Clear to auscultate bilaterally without rhonchi, wheeze, or rales. Heart:  Regular rate and rhythm.  Without murmurs, rubs, or gallops. Abdomen:  Soft, nontender, nondistended.  Without guarding or rebound. Extremities: Without cyanosis, clubbing, edema, or obvious deformity. Vascular:  Dorsalis pedis and posterior tibial pulses palpable bilaterally. Skin:  Warm and dry, no erythema, no ulcerations.   Labs on Admission: I have personally reviewed following labs and imaging studies  CBC: Recent Labs  Lab 08/21/21 0329  WBC 13.7*   HGB 9.4*  HCT 28.9*  MCV 88.1  PLT 915   Basic Metabolic Panel: Recent Labs  Lab 08/21/21 0329  NA 132*  K 4.4  CL 103  CO2 22  GLUCOSE 291*  BUN 15  CREATININE 1.35*  CALCIUM 8.1*   GFR: Estimated Creatinine Clearance: 56.3 mL/min (A) (by C-G formula based on SCr of 1.35 mg/dL (H)). Liver Function Tests: No results for input(s): AST, ALT, ALKPHOS, BILITOT, PROT, ALBUMIN in the last 168 hours. No results for input(s): LIPASE, AMYLASE in the last 168 hours. No results for input(s): AMMONIA in the last 168 hours. Coagulation Profile: No results for input(s): INR, PROTIME in the last 168 hours. Cardiac Enzymes: No results for input(s): CKTOTAL, CKMB, CKMBINDEX, TROPONINI in the last 168 hours. BNP (last 3 results) No results for input(s): PROBNP in the last 8760 hours. HbA1C: No results for input(s): HGBA1C in the last 72 hours. CBG: Recent Labs  Lab 08/20/21 1530 08/20/21 2108 08/21/21 0733 08/21/21 1105 08/21/21 1533  GLUCAP 190* 216* 245* 274* 204*   Lipid Profile: No results for input(s): CHOL, HDL, LDLCALC, TRIG, CHOLHDL, LDLDIRECT in the last 72 hours. Thyroid Function Tests: No results for input(s): TSH, T4TOTAL, FREET4, T3FREE, THYROIDAB in the last 72 hours. Anemia Panel: No results for input(s): VITAMINB12, FOLATE, FERRITIN, TIBC, IRON, RETICCTPCT in the last 72 hours. Urine analysis:    Component Value Date/Time   COLORURINE YELLOW 08/13/2021 1027   APPEARANCEUR HAZY (A) 08/13/2021 1027   LABSPEC 1.023 08/13/2021 1027   PHURINE 5.0 08/13/2021 1027   GLUCOSEU NEGATIVE 08/13/2021 1027   HGBUR NEGATIVE 08/13/2021 1027   BILIRUBINUR NEGATIVE 08/13/2021 1027   BILIRUBINUR neg 04/12/2018 [place] 08/13/2021 1027   PROTEINUR NEGATIVE 08/13/2021 1027   UROBILINOGEN 0.2 04/12/2018 1006  UROBILINOGEN 0.2 11/18/2014 2325   NITRITE NEGATIVE 08/13/2021 1027   LEUKOCYTESUR NEGATIVE 08/13/2021 1027    [facility] For contact please use secure messenger on Epic  If 7PM-7AM, please contact night-coverage located on [email or web address]   08/21/2021, 4:54 PM

## 2021-08-21 NOTE — Progress Notes (Signed)
Inpatient Diabetes Program Recommendations  AACE/ADA: New Consensus Statement on Inpatient Glycemic Control (2015)  Target Ranges:  Prepandial:   less than 140 mg/dL      Peak postprandial:   less than 180 mg/dL (1-2 hours)      Critically ill patients:  140 - 180 mg/dL   Lab Results  Component Value Date   GLUCAP 274 (H) 08/21/2021   HGBA1C 7.2 (A) 07/26/2021    Review of Glycemic Control  Latest Reference Range & Units 08/20/21 21:08 08/21/21 07:33 08/21/21 11:05  Glucose-Capillary 70 - 99 mg/dL 216 (H) 245 (H) 274 (H)  (H): Data is abnormally high  Diabetes history: Type 2 DM Outpatient Diabetes medications: Tresiba 155 units QHS, Farxiga 5 mg QD Current orders for Inpatient glycemic control: Novolog 0-15 units TID, Novolog 4 units TID, Farxiga 5 mg QD, Levemir 45 units QHS Decadron 8 mg QD  Inpatient Diabetes Program Recommendations:   Noted addition of Wilder Glade today; received steroids 11/29 thus partially explaining elevated glucose trends. Consider further increasing Levemir to 50 units QHS.   Thanks, Bronson Curb, MSN, RNC-OB Diabetes Coordinator [phone] (8a-5p)

## 2021-08-21 NOTE — Care Management Obs Status (Signed)
[place] NOTIFICATION   Patient Details  Name: [name] MRN: [id number] Date of Birth: Sep 27, 1970   Medicare Observation Status Notification Given:  Yes    Carles Collet, RN 08/21/2021, 1:43 PM

## 2021-08-22 ENCOUNTER — Observation Stay (HOSPITAL_COMMUNITY): Payer: 59

## 2021-08-22 DIAGNOSIS — E1122 Type 2 diabetes mellitus with diabetic chronic kidney disease: Secondary | ICD-10-CM | POA: Diagnosis not present

## 2021-08-22 DIAGNOSIS — E871 Hypo-osmolality and hyponatremia: Secondary | ICD-10-CM | POA: Diagnosis not present

## 2021-08-22 DIAGNOSIS — E861 Hypovolemia: Secondary | ICD-10-CM | POA: Diagnosis not present

## 2021-08-22 DIAGNOSIS — Z01818 Encounter for other preprocedural examination: Secondary | ICD-10-CM

## 2021-08-22 DIAGNOSIS — M5116 Intervertebral disc disorders with radiculopathy, lumbar region: Secondary | ICD-10-CM | POA: Diagnosis present

## 2021-08-22 DIAGNOSIS — N179 Acute kidney failure, unspecified: Secondary | ICD-10-CM | POA: Diagnosis not present

## 2021-08-22 DIAGNOSIS — F32A Depression, unspecified: Secondary | ICD-10-CM | POA: Diagnosis not present

## 2021-08-22 DIAGNOSIS — E272 Addisonian crisis: Secondary | ICD-10-CM | POA: Diagnosis not present

## 2021-08-22 DIAGNOSIS — F419 Anxiety disorder, unspecified: Secondary | ICD-10-CM | POA: Diagnosis present

## 2021-08-22 DIAGNOSIS — J449 Chronic obstructive pulmonary disease, unspecified: Secondary | ICD-10-CM | POA: Diagnosis present

## 2021-08-22 DIAGNOSIS — Z6841 Body Mass Index (BMI) 40.0 and over, adult: Secondary | ICD-10-CM | POA: Diagnosis not present

## 2021-08-22 DIAGNOSIS — Z20822 Contact with and (suspected) exposure to covid-19: Secondary | ICD-10-CM | POA: Diagnosis not present

## 2021-08-22 DIAGNOSIS — M4326 Fusion of spine, lumbar region: Secondary | ICD-10-CM | POA: Diagnosis present

## 2021-08-22 DIAGNOSIS — Z419 Encounter for procedure for purposes other than remedying health state, unspecified: Secondary | ICD-10-CM | POA: Diagnosis not present

## 2021-08-22 DIAGNOSIS — E662 Morbid (severe) obesity with alveolar hypoventilation: Secondary | ICD-10-CM | POA: Diagnosis not present

## 2021-08-22 DIAGNOSIS — I9581 Postprocedural hypotension: Secondary | ICD-10-CM | POA: Diagnosis not present

## 2021-08-22 DIAGNOSIS — N183 Chronic kidney disease, stage 3 unspecified: Secondary | ICD-10-CM | POA: Diagnosis not present

## 2021-08-22 DIAGNOSIS — A419 Sepsis, unspecified organism: Secondary | ICD-10-CM | POA: Diagnosis not present

## 2021-08-22 DIAGNOSIS — R6510 Systemic inflammatory response syndrome (SIRS) of non-infectious origin without acute organ dysfunction: Secondary | ICD-10-CM | POA: Diagnosis not present

## 2021-08-22 DIAGNOSIS — I129 Hypertensive chronic kidney disease with stage 1 through stage 4 chronic kidney disease, or unspecified chronic kidney disease: Secondary | ICD-10-CM | POA: Diagnosis not present

## 2021-08-22 DIAGNOSIS — Z8261 Family history of arthritis: Secondary | ICD-10-CM | POA: Diagnosis not present

## 2021-08-22 DIAGNOSIS — D62 Acute posthemorrhagic anemia: Secondary | ICD-10-CM | POA: Diagnosis not present

## 2021-08-22 DIAGNOSIS — M4316 Spondylolisthesis, lumbar region: Secondary | ICD-10-CM | POA: Diagnosis not present

## 2021-08-22 DIAGNOSIS — J386 Stenosis of larynx: Secondary | ICD-10-CM | POA: Diagnosis present

## 2021-08-22 DIAGNOSIS — T8141XA Infection following a procedure, superficial incisional surgical site, initial encounter: Secondary | ICD-10-CM | POA: Diagnosis not present

## 2021-08-22 DIAGNOSIS — Z825 Family history of asthma and other chronic lower respiratory diseases: Secondary | ICD-10-CM | POA: Diagnosis not present

## 2021-08-22 DIAGNOSIS — D649 Anemia, unspecified: Secondary | ICD-10-CM | POA: Diagnosis not present

## 2021-08-22 LAB — CBC WITH DIFFERENTIAL/PLATELET
Abs Immature Granulocytes: 0.06 10*3/uL (ref 0.00–0.07)
Basophils Absolute: 0 10*3/uL (ref 0.0–0.1)
Basophils Relative: 0 %
Eosinophils Absolute: 0 10*3/uL (ref 0.0–0.5)
Eosinophils Relative: 0 %
HCT: 26.5 % — ABNORMAL LOW (ref 36.0–46.0)
Hemoglobin: 8.4 g/dL — ABNORMAL LOW (ref 12.0–15.0)
Immature Granulocytes: 0 %
Lymphocytes Relative: 19 %
Lymphs Abs: 2.9 10*3/uL (ref 0.7–4.0)
MCH: 28.1 pg (ref 26.0–34.0)
MCHC: 31.7 g/dL (ref 30.0–36.0)
MCV: 88.6 fL (ref 80.0–100.0)
Monocytes Absolute: 1.1 10*3/uL — ABNORMAL HIGH (ref 0.1–1.0)
Monocytes Relative: 7 %
Neutro Abs: 11.5 10*3/uL — ABNORMAL HIGH (ref 1.7–7.7)
Neutrophils Relative %: 74 %
Platelets: 247 10*3/uL (ref 150–400)
RBC: 2.99 MIL/uL — ABNORMAL LOW (ref 3.87–5.11)
RDW: 14.3 % (ref 11.5–15.5)
WBC: 15.6 10*3/uL — ABNORMAL HIGH (ref 4.0–10.5)
nRBC: 0 % (ref 0.0–0.2)

## 2021-08-22 LAB — COMPREHENSIVE METABOLIC PANEL
ALT: 22 U/L (ref 0–44)
ALT: 22 U/L (ref 0–44)
AST: 33 U/L (ref 15–41)
AST: 30 U/L (ref 15–41)
Albumin: 2.8 g/dL — ABNORMAL LOW (ref 3.5–5.0)
Albumin: 2.6 g/dL — ABNORMAL LOW (ref 3.5–5.0)
Alkaline Phosphatase: 90 U/L (ref 38–126)
Alkaline Phosphatase: 79 U/L (ref 38–126)
Anion gap: 7 (ref 5–15)
Anion gap: 7 (ref 5–15)
BUN: 21 mg/dL — ABNORMAL HIGH (ref 6–20)
BUN: 19 mg/dL (ref 6–20)
CO2: 22 mmol/L (ref 22–32)
CO2: 21 mmol/L — ABNORMAL LOW (ref 22–32)
Calcium: 8.3 mg/dL — ABNORMAL LOW (ref 8.9–10.3)
Calcium: 8.2 mg/dL — ABNORMAL LOW (ref 8.9–10.3)
Chloride: 108 mmol/L (ref 98–111)
Chloride: 110 mmol/L (ref 98–111)
Creatinine, Ser: 1.68 mg/dL — ABNORMAL HIGH (ref 0.44–1.00)
Creatinine, Ser: 1.41 mg/dL — ABNORMAL HIGH (ref 0.44–1.00)
GFR, Estimated: 37 mL/min — ABNORMAL LOW (ref >60)
GFR, Estimated: 45 mL/min — ABNORMAL LOW (ref >60)
Glucose, Bld: 189 mg/dL — ABNORMAL HIGH (ref 70–99)
Glucose, Bld: 161 mg/dL — ABNORMAL HIGH (ref 70–99)
Potassium: 4.4 mmol/L (ref 3.5–5.1)
Potassium: 3.9 mmol/L (ref 3.5–5.1)
Sodium: 137 mmol/L (ref 135–145)
Sodium: 138 mmol/L (ref 135–145)
Total Bilirubin: 0.3 mg/dL (ref 0.3–1.2)
Total Bilirubin: 0.5 mg/dL (ref 0.3–1.2)
Total Protein: 6.4 g/dL — ABNORMAL LOW (ref 6.5–8.1)
Total Protein: 6 g/dL — ABNORMAL LOW (ref 6.5–8.1)

## 2021-08-22 LAB — GLUCOSE, CAPILLARY
Glucose-Capillary: 146 mg/dL — ABNORMAL HIGH (ref 70–99)
Glucose-Capillary: 174 mg/dL — ABNORMAL HIGH (ref 70–99)
Glucose-Capillary: 152 mg/dL — ABNORMAL HIGH (ref 70–99)
Glucose-Capillary: 166 mg/dL — ABNORMAL HIGH (ref 70–99)

## 2021-08-22 LAB — CBC
HCT: 30.1 % — ABNORMAL LOW (ref 36.0–46.0)
Hemoglobin: 9.6 g/dL — ABNORMAL LOW (ref 12.0–15.0)
MCH: 28.6 pg (ref 26.0–34.0)
MCHC: 31.9 g/dL (ref 30.0–36.0)
MCV: 89.6 fL (ref 80.0–100.0)
Platelets: 262 10*3/uL (ref 150–400)
RBC: 3.36 MIL/uL — ABNORMAL LOW (ref 3.87–5.11)
RDW: 14.2 % (ref 11.5–15.5)
WBC: 15.6 10*3/uL — ABNORMAL HIGH (ref 4.0–10.5)
nRBC: 0 % (ref 0.0–0.2)

## 2021-08-22 LAB — LACTIC ACID, PLASMA
Lactic Acid, Venous: 0.9 mmol/L (ref 0.5–1.9)
Lactic Acid, Venous: 1 mmol/L (ref 0.5–1.9)

## 2021-08-22 LAB — PROTIME-INR
INR: 1.2 (ref 0.8–1.2)
Prothrombin Time: 15 seconds (ref 11.4–15.2)

## 2021-08-22 LAB — APTT: aPTT: 31 seconds (ref 24–36)

## 2021-08-22 LAB — PROCALCITONIN: Procalcitonin: 1.53 ng/mL

## 2021-08-22 MED ORDER — SODIUM CHLORIDE 0.9 % IV SOLN
2.0000 g | Freq: Two times a day (BID) | INTRAVENOUS | Status: DC
  Administered 2021-08-22 – 2021-08-23 (×3): 2 g via INTRAVENOUS
  Filled 2021-08-22 (×3): qty 2

## 2021-08-22 MED ORDER — SODIUM CHLORIDE 0.9 % IV BOLUS (SEPSIS)
500.0000 mL | Freq: Once | INTRAVENOUS | Status: AC
  Administered 2021-08-22: 500 mL via INTRAVENOUS

## 2021-08-22 MED ORDER — INSULIN ASPART 100 UNIT/ML IJ SOLN
0.0000 [IU] | Freq: Three times a day (TID) | INTRAMUSCULAR | Status: DC
Start: 2021-08-22 — End: 2021-08-27
  Administered 2021-08-22 (×2): 2 [IU] via SUBCUTANEOUS
  Administered 2021-08-24: 1 [IU] via SUBCUTANEOUS
  Administered 2021-08-24 – 2021-08-26 (×4): 2 [IU] via SUBCUTANEOUS

## 2021-08-22 MED ORDER — SODIUM CHLORIDE 0.9 % IV SOLN: 2.0000 g | Freq: Once | INTRAVENOUS | Status: DC

## 2021-08-22 MED ORDER — VANCOMYCIN HCL 2000 MG/400ML IV SOLN
2000.0000 mg | Freq: Once | INTRAVENOUS | Status: AC
  Administered 2021-08-22: 2000 mg via INTRAVENOUS
  Filled 2021-08-22: qty 400

## 2021-08-22 MED ORDER — VANCOMYCIN HCL 1000 MG/200ML IV SOLN: 1000.0000 mg | Freq: Once | INTRAVENOUS | Status: DC

## 2021-08-22 MED ORDER — VANCOMYCIN HCL 750 MG/150ML IV SOLN
750.0000 mg | Freq: Two times a day (BID) | INTRAVENOUS | Status: DC
  Administered 2021-08-22 – 2021-08-24 (×5): 750 mg via INTRAVENOUS
  Filled 2021-08-22 (×7): qty 150

## 2021-08-22 MED ORDER — INSULIN DETEMIR 100 UNIT/ML ~~LOC~~ SOLN
25.0000 [IU] | Freq: Two times a day (BID) | SUBCUTANEOUS | Status: DC
  Administered 2021-08-22 – 2021-08-26 (×6): 25 [IU] via SUBCUTANEOUS
  Filled 2021-08-22 (×10): qty 0.25

## 2021-08-22 MED ORDER — INSULIN ASPART 100 UNIT/ML IJ SOLN: 0.0000 [IU] | Freq: Every day | INTRAMUSCULAR | Status: DC

## 2021-08-22 MED ORDER — METRONIDAZOLE 500 MG/100ML IV SOLN
500.0000 mg | Freq: Two times a day (BID) | INTRAVENOUS | Status: DC
  Administered 2021-08-22 (×2): 500 mg via INTRAVENOUS
  Filled 2021-08-22 (×3): qty 100

## 2021-08-22 MED ORDER — INSULIN ASPART 100 UNIT/ML IJ SOLN
2.0000 [IU] | Freq: Three times a day (TID) | INTRAMUSCULAR | Status: DC
  Administered 2021-08-22 – 2021-08-26 (×8): 2 [IU] via SUBCUTANEOUS

## 2021-08-22 NOTE — Progress Notes (Addendum)
[facility] PROGRESS NOTE    Progress Note  [name]  WGN:[id number] DOB: 12/17/1970 DOA: 08/20/2021 PCP: [name], MD     Brief Narrative:   [name] is an 50 y.o. female past medical history significant for diabetes mellitus type 2, essential hypertension, obstructive sleep apnea depression status post tracheostomy secondary to previous intubation related tracheal injury leading to subglottic stenosis on 2 L nasal cannula at night admitted by orthopedic surgery for intractable back pain and right lower extremity radiculopathy underwent right L4-L5 transforaminal lumbar interbody fusion with rods and screws and cage with local bone graft on 08/20/2021, she tolerated the procedure well postoperatively developed hypotension thought to be secondary to hypovolemia in nature in the setting of narcotic induced ongoing uncontrolled pain.   Assessment/Plan:   Hypotension: Of unclear etiology, with worsening renal function despite holding diuretic and ACE inhibitor although she has been receiving ketorolac, new onset fever leukocytosis with left shift concern about sepsis etiology. Despite IV fluid hydration she continues to be tachycardic with borderline blood pressure, with a leukocytosis and a new fever.  She has not been on steroids which will make me think of adrenal insufficiency. We will go ahead and get a chest x-ray use Tylenol for fevers, check a procalcitonin and blood cultures started empirically on antibiotics through the sepsis pathway. Unlikely neurogenic shock she is able to move all 4 extremities getting up to the chair to urinate which she relates she feels dizzy upon standing. Also in the differential include medications like her narcotics and her recent anesthetics.   Acute kidney injury on chronic kidney disease stage III: With a baseline creatinine 1.1-1.2. Multifactorial in the setting of NSAID use (ketorolac) and SIRS  reaction.  Discontinue ketorolac avoid all NSAIDs. Continue IV fluids recheck a basic metabolic panel in the morning.  Hyponatremia possibly hypovolemic: slight resolved with IV fluid hydration.  Diabetes mellitus type 2: She relates she does not have an appetite, A1c was 7.4 currently on long-acting insulin plus sliding scale we will go ahead and increase long-acting insulin continue sliding scale.  Obesity hypoventilation syndrome/obstructive sleep apnea/chronic tracheostomy related to subglottic stenosis: Continue inhalers and supplemental oxygen at night.  Intractable back pain and right lower extremity radiculopathy present on admission: Status post lumbar fusion 08/20/2021. Defer further management per orthopedic surgery, we could try oral narcotics instead of IV which have a little less of an impact in her blood pressure. I would absolutely try to avoid NSAIDs like ketorolac, will also try to avoid Robaxin as her renal function is deteriorating.  Anxiety/depression: Continue Wellbutrin Zoloft and Topamax.  DVT prophylaxis: lovenox Family Communication:none Status is: Observation  The patient will require care spanning > 2 midnights and should be moved to inpatient because: Significant hypotension with tachycardia worsening leukocytosis concern about sepsis etiology       Code Status:     Code Status Orders  (From admission, onward)           Start     Ordered   08/20/21 1527  Full code  Continuous        08/20/21 1527           Code Status History     Date Active Date Inactive Code Status Order ID Comments User Context   09/16/2020 2210 09/22/2020 0048 Full Code [id number]  Daisy Floro, DO ED   08/27/2020 0030 08/29/2020 2101 Full Code [id number]  Wilber Oliphant, MD ED   08/26/2020 2302 08/27/2020 1027  Full Code [id number]  Ezequiel Essex, MD ED   07/28/2019 1733 07/29/2019 1921 Full Code [id number]  Rosalin Hawking Inpatient   03/22/2019 6333  03/26/2019 2049 Full Code [id number]  Karmen Bongo, MD Inpatient   10/24/2018 0013 10/29/2018 1918 Full Code [id number]  [name], MD Inpatient   03/05/2018 2110 03/12/2018 0058 Full Code [id number]  Jule Ser, DO ED   06/02/2017 1608 06/09/2017 1320 Full Code [id number]  Radene Gunning, NP ED   05/06/2017 2352 05/08/2017 1717 Full Code [id number]  Edwin Dada, MD ED   02/27/2017 1448 03/04/2017 1345 Full Code [id number]  Bary Leriche, PA-C Inpatient   02/19/2017 0046 02/27/2017 1426 Full Code [id number]  Carlyle Dolly, MD ED   01/30/2015 1923 01/31/2015 0555 Full Code [id number]  Phillips Grout, MD Inpatient   06/04/2014 1715 06/06/2014 1949 Full Code [id number]  Otho Bellows, MD Inpatient   02/26/2014 2209 03/04/2014 1612 Full Code [id number]  Delfina Redwood, MD Inpatient   04/27/2013 1418 05/01/2013 1530 Full Code [id number]  Louellen Molder, MD Inpatient   08/31/2011 1339 09/01/2011 0030 Full Code [id number]  Lawyer, Resa Miner, PA-C ED         IV Access:   Peripheral IV   Procedures and diagnostic studies:   DG Lumbar Spine Complete  Result Date: 08/20/2021 CLINICAL DATA:  L4-L5 TLIF EXAM: LUMBAR SPINE - COMPLETE 4+ VIEW COMPARISON:  04/01/2021 FLUOROSCOPY TIME:  1 minute 14 seconds Dose: 58.78 mGy Images: 4 FINDINGS: 5 lumbar vertebra on prior exam. Current images demonstrate placement of BILATERAL pedicle screws and posterior bars at L4-L5 with intervening disc prosthesis. No fracture or subluxation. IMPRESSION: Posterior fusion L4-L5. Electronically Signed   By: Lavonia Dana M.D.   On: 08/20/2021 14:51   DG C-Arm 1-60 Min-No Report  Result Date: 08/20/2021 Fluoroscopy was utilized by the requesting physician.  No radiographic interpretation.   DG C-Arm 1-60 Min-No Report  Result Date: 08/20/2021 Fluoroscopy was utilized by the requesting physician.  No radiographic interpretation.   DG C-Arm 1-60 Min-No Report  Result Date: 08/20/2021 Fluoroscopy was  utilized by the requesting physician.  No radiographic interpretation.     Medical Consultants:   None.   Subjective:    [name] continues to complain of back and right leg pain some dyspnea, no appetite.  Objective:    Vitals:   08/21/21 2328 08/21/21 2329 08/22/21 0352 08/22/21 0735  BP:   127/77 103/72  Pulse: (!) 107 98 (!) 114 (!) 118  Resp: 18 19 18 20   Temp:   99.2 F (37.3 C) (!) 100.5 F (38.1 C)  TempSrc:   Oral Oral  SpO2: 99%  100% 98%  Weight:      Height:       SpO2: 98 % O2 Flow Rate (L/min): 5 L/min FiO2 (%): 28 %   Intake/Output Summary (Last 24 hours) at 08/22/2021 0800 Last data filed at 08/22/2021 0045 Gross per 24 hour  Intake 475 ml  Output 350 ml  Net 125 ml   Filed Weights   08/20/21 0623  Weight: 109 kg    Exam: General exam: In no acute distress. Respiratory system: Good air movement and clear to auscultation. Cardiovascular system: S1 & S2 heard, RRR. No JVD. Gastrointestinal system: Abdomen is nondistended, soft and nontender.  Central nervous system: Alert and oriented. No focal neurological deficits. Extremities: No pedal edema. Skin: No rashes, lesions or ulcers Psychiatry: Judgement and insight appear normal. Mood &  affect appropriate.    Data Reviewed:    Labs: Basic Metabolic Panel: Recent Labs  Lab 08/21/21 0329 08/21/21 1722 08/22/21 0203  NA 132* 136 137  K 4.4 3.9 4.4  CL 103 106 108  CO2 22 23 22   GLUCOSE 291* 166* 189*  BUN 15 18 21*  CREATININE 1.35* 1.52* 1.68*  CALCIUM 8.1* 7.9* 8.3*   GFR Estimated Creatinine Clearance: 45.2 mL/min (A) (by C-G formula based on SCr of 1.68 mg/dL (H)). Liver Function Tests: Recent Labs  Lab 08/22/21 0203  AST 33  ALT 22  ALKPHOS 90  BILITOT 0.3  PROT 6.4*  ALBUMIN 2.8*   No results for input(s): LIPASE, AMYLASE in the last 168 hours. No results for input(s): AMMONIA in the last 168 hours. Coagulation profile No results for input(s):  INR, PROTIME in the last 168 hours. COVID-19 Labs  No results for input(s): DDIMER, FERRITIN, LDH, CRP in the last 72 hours.  Lab Results  Component Value Date   SARSCOV2NAA NEGATIVE 08/19/2021   SARSCOV2NAA POSITIVE (A) 09/16/2020   SARSCOV2NAA NEGATIVE 08/26/2020   [place] NEGATIVE 08/09/2020    CBC: Recent Labs  Lab 08/21/21 0329 08/21/21 1722 08/22/21 0203  WBC 13.7* 13.3* 15.6*  NEUTROABS  --  9.0*  --   HGB 9.4* 8.7* 9.6*  HCT 28.9* 26.8* 30.1*  MCV 88.1 89.0 89.6  PLT 298 251 262   Cardiac Enzymes: No results for input(s): CKTOTAL, CKMB, CKMBINDEX, TROPONINI in the last 168 hours. BNP (last 3 results) No results for input(s): PROBNP in the last 8760 hours. CBG: Recent Labs  Lab 08/21/21 0733 08/21/21 1105 08/21/21 1533 08/21/21 2051 08/22/21 0732  GLUCAP 245* 274* 204* 155* 146*   D-Dimer: No results for input(s): DDIMER in the last 72 hours. Hgb A1c: Recent Labs    08/21/21 0329  HGBA1C 7.4*   Lipid Profile: No results for input(s): CHOL, HDL, LDLCALC, TRIG, CHOLHDL, LDLDIRECT in the last 72 hours. Thyroid function studies: No results for input(s): TSH, T4TOTAL, T3FREE, THYROIDAB in the last 72 hours.  Invalid input(s): FREET3 Anemia work up: No results for input(s): VITAMINB12, FOLATE, FERRITIN, TIBC, IRON, RETICCTPCT in the last 72 hours. Sepsis Labs: Recent Labs  Lab 08/21/21 0329 08/21/21 1722 08/22/21 0203  WBC 13.7* 13.3* 15.6*   Microbiology Recent Results (from the past 240 hour(s))  Surgical pcr screen     Status: None   Collection Time: 08/13/21 10:27 AM   Specimen: Nasal Mucosa; Nasal Swab  Result Value Ref Range Status   MRSA, PCR NEGATIVE NEGATIVE Final   Staphylococcus aureus NEGATIVE NEGATIVE Final    Comment: (NOTE) The Xpert SA Assay (FDA approved for NASAL specimens in patients 41 years of age and older), is one component of a comprehensive surveillance program. It is not intended to diagnose infection nor  to guide or monitor treatment. Performed at [facility], [place] [address] [place], [place] [address]   SARS CORONAVIRUS 2 (TAT 6-24 HRS) Nasopharyngeal Nasopharyngeal Swab     Status: None   Collection Time: 08/19/21 12:52 PM   Specimen: Nasopharyngeal Swab  Result Value Ref Range Status   SARS Coronavirus 2 NEGATIVE NEGATIVE Final    Comment: (NOTE) SARS-CoV-2 target nucleic acids are NOT DETECTED.  The SARS-CoV-2 RNA is generally detectable in upper and lower respiratory specimens during the acute phase of infection. Negative results do not preclude SARS-CoV-2 infection, do not rule out co-infections with other pathogens, and should not be used as the sole basis  for treatment or other patient management decisions. Negative results must be combined with clinical observations, patient history, and epidemiological information. The expected result is Negative.  Fact Sheet for Patients: SugarRoll.be  Fact Sheet for Healthcare Providers: https://www.woods-mathews.com/  This test is not yet approved or cleared by the [place] FDA and  has been authorized for detection and/or diagnosis of SARS-CoV-2 by FDA under an Emergency Use Authorization (EUA). This EUA will remain  in effect (meaning this test can be used) for the duration of the COVID-19 declaration under Se ction 564(b)(1) of the Act, 21 U.S.C. section 360bbb-3(b)(1), unless the authorization is terminated or revoked sooner.  Performed at [facility], [place] [address], [place], [place] [address]   Surgical pcr screen     Status: None   Collection Time: 08/20/21  7:05 AM   Specimen: Nasal Mucosa; Nasal Swab  Result Value Ref Range Status   MRSA, PCR NEGATIVE NEGATIVE Final   Staphylococcus aureus NEGATIVE NEGATIVE Final    Comment: (NOTE) The Xpert SA Assay (FDA approved for NASAL specimens in patients 65 years of age and older), is one component of a  comprehensive surveillance program. It is not intended to diagnose infection nor to guide or monitor treatment. Performed at [facility], [place] [address] [place], [place] [address]      Medications:    atorvastatin  20 mg Oral Daily   buPROPion  150 mg Oral BID   docusate sodium  100 mg Oral BID   gabapentin  600 mg Oral TID   insulin aspart  0-15 Units Subcutaneous TID WC   insulin aspart  4 Units Subcutaneous TID WC   insulin detemir  45 Units Subcutaneous QHS   loratadine  10 mg Oral Daily   melatonin  3 mg Oral QHS   mometasone-formoterol  2 puff Inhalation BID   oxyCODONE  10 mg Oral Q12H   pantoprazole  40 mg Oral QHS   sertraline  100 mg Oral QHS   sodium chloride flush  3 mL Intravenous Q12H   sucralfate  1 g Oral QHS   topiramate  150 mg Oral BID   Continuous Infusions:  sodium chloride     sodium chloride 125 mL/hr at 08/22/21 0040   methocarbamol (ROBAXIN) IV 500 mg (08/22/21 0504)      LOS: 0 days   Charlynne Cousins  [facility]  08/22/2021, 8:00 AM

## 2021-08-22 NOTE — Progress Notes (Signed)
Pharmacy Antibiotic Note  Vanesa Renier [name] is a 50 y.o. female admitted on 08/20/2021 with sepsis.  Pharmacy has been consulted for Cefepime and Vancomycin dosing. Patient has new AKI, will dose conservatively.   Plan: Cefepime 2g Q12h Vancomycin 2000mg  x1 followed by vancomycin 750mg  Q12h (Scr 1.68, eAUC 451) Monitor renal function, UOP, adjust antibiotics as needed   Height: 5\' 1"  (154.9 cm) Weight: 109 kg (240 lb 4.8 oz) IBW/kg (Calculated) : 47.8  Temp (24hrs), Avg:99.5 F (37.5 C), Min:98.9 F (37.2 C), Max:100.5 F (38.1 C)  Recent Labs  Lab 08/21/21 0329 08/21/21 1722 08/22/21 0203  WBC 13.7* 13.3* 15.6*  CREATININE 1.35* 1.52* 1.68*    Estimated Creatinine Clearance: 45.2 mL/min (A) (by C-G formula based on SCr of 1.68 mg/dL (H)).    Allergies  Allergen Reactions   Reglan [Metoclopramide] Other (See Comments)    Panic attack    Antimicrobials this admission: Cefazolin x3 doses pre/post-op 11/29-30   Microbiology results: 12/01 BCx: pending   Thank you for allowing pharmacy to be a part of this patient's care.  Ardyth Harps, PharmD Clinical Pharmacist

## 2021-08-22 NOTE — Progress Notes (Signed)
Occupational Therapy Treatment Patient Details Name: [name] MRN: [id number] DOB: Apr 17, 1971 Today's Date: 08/22/2021   History of present illness 50 y/o female admitted following R L4-5 transforaminal lumbar fusion on 11/29. PMH: HTN, COPD, depression, DM type 2, subglottic stenosis and tracheal stenosis s/p tracheostomy (current)   OT comments  Pt progressing towards established established OT goals. Pt performing toileting with Min Guard A; providing education on compensatory techniques for toilet hygiene. Pt performing hand hygiene at sink with Min Guard A for safety and cues for upright posture. Continues to present with decreased activity tolerance due to pain. Continue to recommend dc to home with HHOT and will continue to follow acutely as admitted.    Recommendations for follow up therapy are one component of a multi-disciplinary discharge planning process, led by the attending physician.  Recommendations may be updated based on patient status, additional functional criteria and insurance authorization.    Follow Up Recommendations  Home health OT    Assistance Recommended at Discharge Intermittent Supervision/Assistance  Equipment Recommendations  BSC/3in1;Other (comment) (RW baratric due to body habitus and height)    Recommendations for Other Services      Precautions / Restrictions Precautions Precautions: Fall;Back Precaution Booklet Issued: No Precaution Comments: old trach; verbally reviewed back precautions, pt has a small orange carrot shape to occlude trach for speaking Required Braces or Orthoses: Spinal Brace Spinal Brace: Lumbar corset;Applied in sitting position       Mobility Bed Mobility               General bed mobility comments: Sitting at EOB upon arrival with NT to assist to bathroom.    Transfers Overall transfer level: Needs assistance Equipment used: Rolling walker (2 wheels) Transfers: Sit to/from Stand Sit to  Stand: Min guard           General transfer comment: Increased time and effort. Min Guard A for safety     Balance Overall balance assessment: Needs assistance Sitting-balance support: Bilateral upper extremity supported;Feet supported Sitting balance-[name]: Fair     Standing balance support: Bilateral upper extremity supported;During functional activity;Single extremity supported;No upper extremity supported Standing balance-[name]: Fair                             ADL either performed or assessed with clinical judgement   ADL Overall ADL's : Needs assistance/impaired     Grooming: Wash/dry hands;Min guard;Standing Grooming Details (indicate cue type and reason): Cues for upright posture.         Upper Body Dressing : Minimal assistance;Sitting Upper Body Dressing Details (indicate cue type and reason): Donning brace with MIn A for positioning   Lower Body Dressing Details (indicate cue type and reason): Will need further education Toilet Transfer: Min guard;Regular Toilet;Rolling walker (2 wheels) (simulated) Toilet Transfer Details (indicate cue type and reason): Min Guard A for safety   Toileting - Clothing Manipulation Details (indicate cue type and reason): Educating on compensatory tehcniques for peri care.     Functional mobility during ADLs: Min guard;Rolling walker (2 wheels) General ADL Comments: Pt performing toilet and hand hygiene. Min Guard A for safety    Extremity/Trunk Assessment Upper Extremity Assessment Upper Extremity Assessment: Overall WFL for tasks assessed   Lower Extremity Assessment Lower Extremity Assessment: Defer to PT evaluation        Vision       Perception     Praxis  Cognition Arousal/Alertness: Awake/alert Behavior During Therapy: WFL for tasks assessed/performed Overall Cognitive Status: Difficult to assess                                 General Comments: Following  commands. maintaining conversation. Apears baseline. Feel possible decreased medical awareness.          Exercises     Shoulder Instructions       General Comments VSS on RA    Pertinent Vitals/ Pain       Pain Assessment: Faces Faces Pain Scale: Hurts whole lot Pain Location: back; R LE Pain Descriptors / Indicators: Grimacing;Guarding Pain Intervention(s): Monitored during session;Limited activity within patient's tolerance;Repositioned  Home Living                                          Prior Functioning/Environment              Frequency  Min 2X/week        Progress Toward Goals  OT Goals(current goals can now be found in the care plan section)  Progress towards OT goals: Progressing toward goals  Acute Rehab OT Goals OT Goal Formulation: With patient Time For Goal Achievement: 09/04/21 Potential to Achieve Goals: Good ADL Goals Pt Will Perform Upper Body Dressing: with modified independence;sitting Pt Will Perform Lower Body Dressing: with modified independence;sit to/from stand Pt Will Transfer to Toilet: with min assist;ambulating;bedside commode  Plan Discharge plan remains appropriate    Co-evaluation                 AM-PAC OT "6 Clicks" Daily Activity     Outcome Measure   Help from another person eating meals?: A Little Help from another person taking care of personal grooming?: A Little Help from another person toileting, which includes using toliet, bedpan, or urinal?: A Little Help from another person bathing (including washing, rinsing, drying)?: A Lot Help from another person to put on and taking off regular upper body clothing?: A Little Help from another person to put on and taking off regular lower body clothing?: A Lot 6 Click Score: 16    End of Session Equipment Utilized During Treatment: Back brace;Rolling walker (2 wheels)  OT Visit Diagnosis: Unsteadiness on feet (R26.81);Muscle weakness  (generalized) (M62.81);Pain   Activity Tolerance Patient tolerated treatment well   Patient Left with call bell/phone within reach;in chair   Nurse Communication Mobility status;Precautions        Time: 7096-2836 OT Time Calculation (min): 30 min  Charges: OT General Charges $OT Visit: 1 Visit OT Treatments $Self Care/Home Management : 23-37 mins  [place], OTR/L Acute Rehab Pager: [phone] Office: [place] 08/22/2021, 3:03 PM

## 2021-08-22 NOTE — Progress Notes (Signed)
     Subjective: 2 Days Post-Op Procedure(s) (LRB): RIGHT L4-5 TRANSFORAMINAL LUMBAR INTERBODY FUSION WITH RODS, SCREWS AND CAGE, LOCAL BONE GRAFT, ALLOGRAFT BONE GRAFT, VIVIGEN (N/A) Awake, alert and oriented x 4. Pain improved with oxycodone. She is sitting in bed, tolerating pos. No BM yet. PT reluctant to work with patient due to hypotension yesterday and also patient with frequent changing out of her tracheostomy plug which may decrease ventilation and respiratory function while exercising.  Anemia, low BP. Creatinine is elevated to 1.68.  Patient reports pain as moderate.    Objective:   VITALS:  Temp:  [98.9 F (37.2 C)-100.5 F (38.1 C)] 99.6 F (37.6 C) (12/01 1110) Pulse Rate:  [98-118] 108 (12/01 1217) Resp:  [18-25] 25 (12/01 1217) BP: (84-127)/(56-77) 110/73 (12/01 1110) SpO2:  [93 %-100 %] 96 % (12/01 1217) FiO2 (%):  [28 %] 28 % (11/30 2329)  Neurologically intact ABD soft Neurovascular intact Sensation intact distally Intact pulses distally Dorsiflexion/Plantar flexion intact Incision: scant drainage   LABS Recent Labs    08/21/21 0329 08/21/21 1722 08/22/21 0203 08/22/21 0826  HGB 9.4* 8.7* 9.6* 8.4*  WBC 13.7* 13.3* 15.6* 15.6*  PLT 298 251 262 247   Recent Labs    08/22/21 0203 08/22/21 0826  NA 137 138  K 4.4 3.9  CL 108 110  CO2 22 21*  BUN 21* 19  CREATININE 1.68* 1.41*  GLUCOSE 189* 161*   Recent Labs    08/22/21 0826  INR 1.2     Assessment/Plan: 2 Days Post-Op Procedure(s) (LRB): RIGHT L4-5 TRANSFORAMINAL LUMBAR INTERBODY FUSION WITH RODS, SCREWS AND CAGE, LOCAL BONE GRAFT, ALLOGRAFT BONE GRAFT, VIVIGEN (N/A) Elevated Creatinine. Hypotension, improved. Pain Control improved.  Advance diet Up with therapy D/C IV fluids Will continue to need evaluation of an apparent acute kidney injury until it is clear that it is stable otherwise at risk of renal compromise and electrolyte disturbance.  Elevated WBC receiving r/o sepsis  due to tracheostomy she is at higher risk of respiratory illness.  Keep of PT until it is clear that she is stable and walking without risk of falling at home due to balance or strength issue. Do not want to  Discharge prematurely and have patient fall due to premature need to discharge.   [name] 08/22/2021, 12:54 PM Patient ID: [name], female   DOB: May 06, 1971, 50 y.o.   MRN: [id number]

## 2021-08-22 NOTE — Progress Notes (Signed)
Physical Therapy Treatment Patient Details Name: [name] MRN: [id number] DOB: 1970/11/20 Today's Date: 08/22/2021   History of Present Illness 50 y/o female admitted following R L4-5 transforaminal lumbar fusion on 11/29. PMH: HTN, COPD, depression, DM type 2, subglottic stenosis and tracheal stenosis s/p tracheostomy (current)    PT Comments    Patient progressing ambulation distance this date. Patient able to ambulate 43' with min guard, RW, and increased time. Patient with complaints of hip and back pain with mobility. Reinforced back precautions during session. Continue to recommend HHPT at this time. Will continue to progress and assess stair negotiation next session if appropriate.     Recommendations for follow up therapy are one component of a multi-disciplinary discharge planning process, led by the attending physician.  Recommendations may be updated based on patient status, additional functional criteria and insurance authorization.  Follow Up Recommendations  Home health PT     Assistance Recommended at Discharge Frequent or constant Supervision/Assistance  Equipment Recommendations  Rolling Ruthell Feigenbaum (2 wheels);BSC/3in1    Recommendations for Other Services       Precautions / Restrictions Precautions Precautions: Fall;Back Precaution Booklet Issued: No Precaution Comments: old trach; verbally reviewed back precautions, pt has a small orange carrot shape to occlude trach for speaking Required Braces or Orthoses: Spinal Brace Spinal Brace: Lumbar corset;Applied in sitting position Restrictions Weight Bearing Restrictions: No     Mobility  Bed Mobility               General bed mobility comments: sitting in recliner    Transfers Overall transfer level: Needs assistance Equipment used: Rolling Patsie Mccardle (2 wheels) Transfers: Sit to/from Stand Sit to Stand: Min guard           General transfer comment: increasde time anf effort to  complete from recliner with min guard    Ambulation/Gait Ambulation/Gait assistance: Min guard Gait Distance (Feet): 50 Feet Assistive device: Rolling Zykera Abella (2 wheels) Gait Pattern/deviations: Step-to pattern;Decreased stride length;Wide base of support Gait velocity: decreased     General Gait Details: slow steady gait. Complaints of hip and back pain with mobility. Min guard for safety.   Stairs             Wheelchair Mobility    Modified Rankin (Stroke Patients Only)       Balance Overall balance assessment: Needs assistance Sitting-balance support: Bilateral upper extremity supported;Feet supported Sitting balance-[name]: Fair     Standing balance support: Bilateral upper extremity supported;During functional activity;Single extremity supported;No upper extremity supported Standing balance-[name]: Poor Standing balance comment: reliant on UE support and external assist                            Cognition Arousal/Alertness: Awake/alert Behavior During Therapy: WFL for tasks assessed/performed Overall Cognitive Status: Difficult to assess                                 General Comments: Following commands. maintaining conversation. Apears baseline. Feel possible decreased medical awareness.        Exercises      General Comments General comments (skin integrity, edema, etc.): VSS on RA      Pertinent Vitals/Pain Pain Assessment: Faces Faces Pain Scale: Hurts whole lot Pain Location: back; R LE Pain Descriptors / Indicators: Grimacing;Guarding Pain Intervention(s): Monitored during session;Repositioned    Home Living  Prior Function            PT Goals (current goals can now be found in the care plan section) Acute Rehab PT Goals Patient Stated Goal: reduce pain PT Goal Formulation: With patient Time For Goal Achievement: 09/04/21 Potential to Achieve Goals:  Good Progress towards PT goals: Progressing toward goals    Frequency    Min 5X/week      PT Plan Current plan remains appropriate    Co-evaluation              AM-PAC PT "6 Clicks" Mobility   Outcome Measure  Help needed turning from your back to your side while in a flat bed without using bedrails?: A Little Help needed moving from lying on your back to sitting on the side of a flat bed without using bedrails?: A Little Help needed moving to and from a bed to a chair (including a wheelchair)?: A Little Help needed standing up from a chair using your arms (e.g., wheelchair or bedside chair)?: A Little Help needed to walk in hospital room?: A Little Help needed climbing 3-5 steps with a railing? : A Lot 6 Click Score: 17    End of Session Equipment Utilized During Treatment: Back brace Activity Tolerance: Patient tolerated treatment well Patient left: in bed;with call bell/phone within reach Nurse Communication: Mobility status;Patient requests pain meds PT Visit Diagnosis: Unsteadiness on feet (R26.81);Muscle weakness (generalized) (M62.81);Difficulty in walking, not elsewhere classified (R26.2)     Time: 5009-3818 PT Time Calculation (min) (ACUTE ONLY): 27 min  Charges:  $Gait Training: 23-37 mins                     Khalia Gong A. Gilford Rile PT, DPT Acute Rehabilitation Services Pager [phone] Office [phone]    [name] 08/22/2021, 5:33 PM

## 2021-08-23 DIAGNOSIS — M4316 Spondylolisthesis, lumbar region: Secondary | ICD-10-CM | POA: Diagnosis not present

## 2021-08-23 DIAGNOSIS — N179 Acute kidney failure, unspecified: Secondary | ICD-10-CM | POA: Diagnosis not present

## 2021-08-23 DIAGNOSIS — R6510 Systemic inflammatory response syndrome (SIRS) of non-infectious origin without acute organ dysfunction: Secondary | ICD-10-CM

## 2021-08-23 DIAGNOSIS — M4326 Fusion of spine, lumbar region: Secondary | ICD-10-CM | POA: Diagnosis not present

## 2021-08-23 LAB — COMPREHENSIVE METABOLIC PANEL
ALT: 22 U/L (ref 0–44)
AST: 23 U/L (ref 15–41)
Albumin: 2.3 g/dL — ABNORMAL LOW (ref 3.5–5.0)
Alkaline Phosphatase: 70 U/L (ref 38–126)
Anion gap: 4 — ABNORMAL LOW (ref 5–15)
BUN: 13 mg/dL (ref 6–20)
CO2: 21 mmol/L — ABNORMAL LOW (ref 22–32)
Calcium: 8 mg/dL — ABNORMAL LOW (ref 8.9–10.3)
Chloride: 111 mmol/L (ref 98–111)
Creatinine, Ser: 1.25 mg/dL — ABNORMAL HIGH (ref 0.44–1.00)
GFR, Estimated: 52 mL/min — ABNORMAL LOW (ref >60)
Glucose, Bld: 130 mg/dL — ABNORMAL HIGH (ref 70–99)
Potassium: 3.8 mmol/L (ref 3.5–5.1)
Sodium: 136 mmol/L (ref 135–145)
Total Bilirubin: 0.4 mg/dL (ref 0.3–1.2)
Total Protein: 5.9 g/dL — ABNORMAL LOW (ref 6.5–8.1)

## 2021-08-23 LAB — CBC WITH DIFFERENTIAL/PLATELET
Abs Immature Granulocytes: 0.05 10*3/uL (ref 0.00–0.07)
Basophils Absolute: 0 10*3/uL (ref 0.0–0.1)
Basophils Relative: 0 %
Eosinophils Absolute: 0.1 10*3/uL (ref 0.0–0.5)
Eosinophils Relative: 1 %
HCT: 27.2 % — ABNORMAL LOW (ref 36.0–46.0)
Hemoglobin: 8.7 g/dL — ABNORMAL LOW (ref 12.0–15.0)
Immature Granulocytes: 0 %
Lymphocytes Relative: 28 %
Lymphs Abs: 3.2 10*3/uL (ref 0.7–4.0)
MCH: 28.5 pg (ref 26.0–34.0)
MCHC: 32 g/dL (ref 30.0–36.0)
MCV: 89.2 fL (ref 80.0–100.0)
Monocytes Absolute: 0.9 10*3/uL (ref 0.1–1.0)
Monocytes Relative: 8 %
Neutro Abs: 7.4 10*3/uL (ref 1.7–7.7)
Neutrophils Relative %: 63 %
Platelets: 277 10*3/uL (ref 150–400)
RBC: 3.05 MIL/uL — ABNORMAL LOW (ref 3.87–5.11)
RDW: 14.3 % (ref 11.5–15.5)
WBC: 11.7 10*3/uL — ABNORMAL HIGH (ref 4.0–10.5)
nRBC: 0 % (ref 0.0–0.2)

## 2021-08-23 LAB — CBC
HCT: 24.3 % — ABNORMAL LOW (ref 36.0–46.0)
Hemoglobin: 7.7 g/dL — ABNORMAL LOW (ref 12.0–15.0)
MCH: 28.6 pg (ref 26.0–34.0)
MCHC: 31.7 g/dL (ref 30.0–36.0)
MCV: 90.3 fL (ref 80.0–100.0)
Platelets: 231 10*3/uL (ref 150–400)
RBC: 2.69 MIL/uL — ABNORMAL LOW (ref 3.87–5.11)
RDW: 14.4 % (ref 11.5–15.5)
WBC: 11.3 10*3/uL — ABNORMAL HIGH (ref 4.0–10.5)
nRBC: 0 % (ref 0.0–0.2)

## 2021-08-23 LAB — GLUCOSE, CAPILLARY
Glucose-Capillary: 114 mg/dL — ABNORMAL HIGH (ref 70–99)
Glucose-Capillary: 80 mg/dL (ref 70–99)
Glucose-Capillary: 129 mg/dL — ABNORMAL HIGH (ref 70–99)
Glucose-Capillary: 143 mg/dL — ABNORMAL HIGH (ref 70–99)

## 2021-08-23 MED ORDER — POLYETHYLENE GLYCOL 3350 17 G PO PACK
17.0000 g | PACK | Freq: Two times a day (BID) | ORAL | Status: AC
  Administered 2021-08-24 (×2): 17 g via ORAL
  Filled 2021-08-23 (×2): qty 1

## 2021-08-23 MED ORDER — GABAPENTIN 600 MG PO TABS
600.0000 mg | ORAL_TABLET | Freq: Two times a day (BID) | ORAL | Status: DC
  Administered 2021-08-24 – 2021-08-26 (×5): 600 mg via ORAL
  Filled 2021-08-23 (×5): qty 1

## 2021-08-23 MED ORDER — FERROUS GLUCONATE 324 (38 FE) MG PO TABS
324.0000 mg | ORAL_TABLET | Freq: Two times a day (BID) | ORAL | Status: DC
  Administered 2021-08-23 – 2021-08-26 (×6): 324 mg via ORAL
  Filled 2021-08-23 (×8): qty 1

## 2021-08-23 MED ORDER — SODIUM CHLORIDE 0.9 % IV SOLN
2.0000 g | Freq: Three times a day (TID) | INTRAVENOUS | Status: DC
  Administered 2021-08-23 – 2021-08-25 (×5): 2 g via INTRAVENOUS
  Filled 2021-08-23 (×5): qty 2

## 2021-08-23 MED ORDER — HYDROMORPHONE HCL 1 MG/ML IJ SOLN
1.0000 mg | INTRAMUSCULAR | Status: DC | PRN
  Administered 2021-08-23 – 2021-08-25 (×8): 1 mg via INTRAVENOUS
  Filled 2021-08-23 (×8): qty 1

## 2021-08-23 MED ORDER — OXYCODONE HCL ER 10 MG PO T12A
20.0000 mg | EXTENDED_RELEASE_TABLET | Freq: Two times a day (BID) | ORAL | Status: DC
  Administered 2021-08-23 – 2021-08-26 (×6): 20 mg via ORAL
  Filled 2021-08-23 (×6): qty 2

## 2021-08-23 MED ORDER — OXYCODONE HCL ER 15 MG PO T12A: 15.0000 mg | EXTENDED_RELEASE_TABLET | Freq: Two times a day (BID) | ORAL | Status: DC

## 2021-08-23 NOTE — Progress Notes (Signed)
Physical Therapy Treatment Patient Details Name: [name] MRN: [id number] DOB: 1971/02/03 Today's Date: 08/23/2021   History of Present Illness 50 y/o female admitted following R L4-5 transforaminal lumbar fusion on 11/29. PMH: HTN, COPD, depression, DM type 2, subglottic stenosis and tracheal stenosis s/p tracheostomy (current)    PT Comments    Patient progressing towards physical therapy goals. Patient progressing ambulation distance this date with min guard and use of RW. Encouraged patient to ambulate with nursing staff and to ambulate to bathroom during the night. D/c plan remains appropriate.     Recommendations for follow up therapy are one component of a multi-disciplinary discharge planning process, led by the attending physician.  Recommendations may be updated based on patient status, additional functional criteria and insurance authorization.  Follow Up Recommendations  Home health PT     Assistance Recommended at Discharge Frequent or constant Supervision/Assistance  Equipment Recommendations  Rolling [name] (2 wheels);BSC/3in1;Wheelchair cushion (measurements PT);Wheelchair (measurements PT)    Recommendations for Other Services       Precautions / Restrictions Precautions Precautions: Fall;Back Precaution Booklet Issued: No Precaution Comments: old trach; verbally reviewed back precautions, pt has a small orange carrot shape to occlude trach for speaking Required Braces or Orthoses: Spinal Brace Spinal Brace: Lumbar corset;Applied in sitting position Restrictions Weight Bearing Restrictions: No     Mobility  Bed Mobility               General bed mobility comments: sitting in recliner    Transfers Overall transfer level: Needs assistance Equipment used: Rolling [name] (2 wheels) Transfers: Sit to/from Stand Sit to Stand: Min guard           General transfer comment: increased time and effort. Min guard for safety     Ambulation/Gait Ambulation/Gait assistance: Min guard Gait Distance (Feet): 70 Feet Assistive device: Rolling [name] (2 wheels) Gait Pattern/deviations: Step-to pattern;Decreased stride length;Wide base of support Gait velocity: decreased     General Gait Details: slow steady gait. Complaints of hip and back pain with mobility. Min guard for safety.   Stairs             Wheelchair Mobility    Modified Rankin (Stroke Patients Only)       Balance Overall balance assessment: Needs assistance Sitting-balance support: Bilateral upper extremity supported;Feet supported Sitting balance-[name]: Fair     Standing balance support: Bilateral upper extremity supported;During functional activity;Single extremity supported;No upper extremity supported Standing balance-[name]: Poor Standing balance comment: reliant on UE support and external assist                            Cognition Arousal/Alertness: Awake/alert Behavior During Therapy: WFL for tasks assessed/performed Overall Cognitive Status: Difficult to assess                                          Exercises      General Comments        Pertinent Vitals/Pain Pain Assessment: Faces Faces Pain Scale: Hurts even more Pain Location: back Pain Descriptors / Indicators: Grimacing;Guarding Pain Intervention(s): Monitored during session    Home Living                          Prior Function  PT Goals (current goals can now be found in the care plan section) Acute Rehab PT Goals Patient Stated Goal: reduce pain PT Goal Formulation: With patient Time For Goal Achievement: 09/04/21 Potential to Achieve Goals: Good Progress towards PT goals: Progressing toward goals    Frequency    Min 5X/week      PT Plan Current plan remains appropriate    Co-evaluation              AM-PAC PT "6 Clicks" Mobility   Outcome Measure  Help needed  turning from your back to your side while in a flat bed without using bedrails?: A Little Help needed moving from lying on your back to sitting on the side of a flat bed without using bedrails?: A Little Help needed moving to and from a bed to a chair (including a wheelchair)?: A Little Help needed standing up from a chair using your arms (e.g., wheelchair or bedside chair)?: A Little Help needed to walk in hospital room?: A Little Help needed climbing 3-5 steps with a railing? : A Lot 6 Click Score: 17    End of Session Equipment Utilized During Treatment: Back brace Activity Tolerance: Patient tolerated treatment well Patient left: with call bell/phone within reach;in chair Nurse Communication: Mobility status PT Visit Diagnosis: Unsteadiness on feet (R26.81);Muscle weakness (generalized) (M62.81);Difficulty in walking, not elsewhere classified (R26.2)     Time: 7614-7092 PT Time Calculation (min) (ACUTE ONLY): 20 min  Charges:  $Gait Training: 8-22 mins                     [name] A. [name] PT, DPT Acute Rehabilitation Services Pager [phone] Office [phone]    [name] 08/23/2021, 6:00 PM

## 2021-08-23 NOTE — Progress Notes (Signed)
     Subjective: 3 Days Post-Op Procedure(s) (LRB): RIGHT L4-5 TRANSFORAMINAL LUMBAR INTERBODY FUSION WITH RODS, SCREWS AND CAGE, LOCAL BONE GRAFT, ALLOGRAFT BONE GRAFT, VIVIGEN (N/A)  Patient reports pain as marked.    Objective:   VITALS:  Temp:  [98.8 F (37.1 C)-99.6 F (37.6 C)] 99.5 F (37.5 C) (12/02 1203) Pulse Rate:  [99-110] 104 (12/02 1203) Resp:  [17-20] 18 (12/02 1203) BP: (97-141)/(62-88) 141/88 (12/02 1203) SpO2:  [94 %-100 %] 100 % (12/02 1203) FiO2 (%):  [28 %] 28 % (12/02 0418)  Neurologically intact ABD soft Neurovascular intact Sensation intact distally Intact pulses distally Dorsiflexion/Plantar flexion intact Incision: dressing C/D/I, no drainage, and New dressing applied No cellulitis present   LABS Recent Labs    08/21/21 0329 08/21/21 1722 08/22/21 0203 08/22/21 0826 08/23/21 0429  HGB 9.4* 8.7* 9.6* 8.4* 7.7*  WBC 13.7* 13.3* 15.6* 15.6* 11.3*  PLT 298 251 262 247 231   Recent Labs    08/22/21 0826 08/23/21 0429  NA 138 136  K 3.9 3.8  CL 110 111  CO2 21* 21*  BUN 19 13  CREATININE 1.41* 1.25*  GLUCOSE 161* 130*   Recent Labs    08/22/21 0826  INR 1.2     Assessment/Plan: 3 Days Post-Op Procedure(s) (LRB): RIGHT L4-5 TRANSFORAMINAL LUMBAR INTERBODY FUSION WITH RODS, SCREWS AND CAGE, LOCAL BONE GRAFT, ALLOGRAFT BONE GRAFT, VIVIGEN (N/A) Anemia due to blood loss, periop likely from surgical site. WBC decreased with antibiotics to near normal. Renal is improved.  Advance diet Up with therapy Being treated empirically for infection or sepsis, incision is dry early for incision infection not enough time for bacterial replication usually  4-7 days post op. Likely pulmonary but CXR is negative Anemia is gradually worsening, partly dilution with IVF and establishing the degree of anemia that has been present.  Start Ferrous gluconate and colace Recheck CBC and type if blood becomes necessary.  Slow with PT, pain  complaints Adjust pain meds.Not ready for discharge on IV antibiotics for presumed sepsis. Anemia is worsened.  [name] 08/23/2021, 1:42 PM Patient ID: [name], female   DOB: [birth year], 50 y.o.   MRN: [id number]

## 2021-08-23 NOTE — Progress Notes (Addendum)
Pharmacy Antibiotic Note  [name] is a 50 y.o. female admitted on 08/20/2021 with sepsis.  Pharmacy has been consulted for Cefepime and Vancomycin dosing. Patient's renal fx has returned to baseline today. WBC improved. Fevers resolved. Blood cultures pending   Plan: -Increase Cefepime to 2 gm IV Q 8 hours  -Continue vancomycin at 750 mg IV Q 12 hours (SCr 1.25, eAUC 502)  -Monitor renal function, UOP, adjust antibiotics as needed -Vanc levels as indicated    Height: 5\' 1"  (154.9 cm) Weight: 109 kg (240 lb 4.8 oz) IBW/kg (Calculated) : 47.8  Temp (24hrs), Avg:99.3 F (37.4 C), Min:98.8 F (37.1 C), Max:99.6 F (37.6 C)  Recent Labs  Lab 08/21/21 0329 08/21/21 1722 08/22/21 0203 08/22/21 0826 08/22/21 1024 08/23/21 0429  WBC 13.7* 13.3* 15.6* 15.6*  --  11.3*  CREATININE 1.35* 1.52* 1.68* 1.41*  --  1.25*  LATICACIDVEN  --   --   --  0.9 1.0  --      Estimated Creatinine Clearance: 60.8 mL/min (A) (by C-G formula based on SCr of 1.25 mg/dL (H)).    Allergies  Allergen Reactions   Reglan [Metoclopramide] Other (See Comments)    Panic attack    Antimicrobials this admission: Cefazolin x3 doses pre/post-op 11/29-30 Vanc 12/01 >> Cefe 12/01 >>  Microbiology results: 12/01 BCx: pending 11/29 MRSA PCR neg    Thank you for allowing pharmacy to be a part of this patient's care.  Albertina Parr, PharmD., BCPS, BCCCP Clinical Pharmacist Please refer to [facility] for unit-specific pharmacist

## 2021-08-23 NOTE — Progress Notes (Signed)
[facility] PROGRESS NOTE    Progress Note  [name]  HUT:[id number] DOB: 1971-03-09 DOA: 08/20/2021 PCP: [name], MD     Brief Narrative:   [name] is an 50 y.o. female past medical history significant for diabetes mellitus type 2, essential hypertension, obstructive sleep apnea depression status post tracheostomy secondary to previous intubation related tracheal injury leading to subglottic stenosis on 2 L nasal cannula at night admitted by orthopedic surgery for intractable back pain and right lower extremity radiculopathy underwent right L4-L5 transforaminal lumbar interbody fusion with rods and screws and cage with local bone graft on 08/20/2021, she tolerated the procedure well postoperatively developed hypotension thought to be secondary to hypovolemia in nature in the setting of narcotic induced ongoing uncontrolled pain.   Assessment/Plan:   SIRS: Unclear source, chest x-ray showed no new infection but she is at high risk to to her tracheostomy. She was started empirically on IV vancomycin and cefepime, she has defervesced, leukocytosis improved.  Her procalcitonin was 1.5 Culture data has remained negative till date. Blood pressure is improving.  KVO IV fluids.  Discontinue Flagyl.  Acute kidney injury on chronic kidney disease stage III: With a baseline creatinine 1.1-1.2. Multifactorial in the setting of NSAIDs, sepsis and SIRS reaction. These were held  Hyponatremia possibly hypovolemic: slight resolved with IV fluid hydration.  Diabetes mellitus type 2: She relates she does not have an appetite, A1c was 7.4. Blood glucose control is improved continue long-acting insulin plus sliding scale.   Obesity hypoventilation syndrome/obstructive sleep apnea/chronic tracheostomy related to subglottic stenosis: Continue inhalers and supplemental oxygen at night.  Intractable back pain and right lower extremity  radiculopathy present on admission: Status post lumbar fusion 08/20/2021. Defer further management per orthopedic surgery. She relates her pain is not controlled, continue narcotics increase IV hydromorphone. Avoid NSAIDs.  Continue Robaxin and narcotics.  Anxiety/depression: Continue Wellbutrin Zoloft and Topamax.  DVT prophylaxis: lovenox Family Communication:none Status is: Observation  The patient will require care spanning > 2 midnights and should be moved to inpatient because: Significant hypotension with tachycardia worsening leukocytosis concern about sepsis etiology       Code Status:     Code Status Orders  (From admission, onward)           Start     Ordered   08/20/21 1527  Full code  Continuous        08/20/21 1527           Code Status History     Date Active Date Inactive Code Status Order ID Comments User Context   09/16/2020 2210 09/22/2020 0048 Full Code [id number]  Daisy Floro, DO ED   08/27/2020 0030 08/29/2020 2101 Full Code [id number]  Wilber Oliphant, MD ED   08/26/2020 2302 08/27/2020 0029 Full Code [id number]  Ezequiel Essex, MD ED   07/28/2019 1733 07/29/2019 1921 Full Code [id number]  Donella Stade, PA-C Inpatient   03/22/2019 0903 03/26/2019 2049 Full Code [id number]  Karmen Bongo, MD Inpatient   10/24/2018 0013 10/29/2018 1918 Full Code [id number]  [name], MD Inpatient   03/05/2018 2110 03/12/2018 0058 Full Code [id number]  Jule Ser, DO ED   06/02/2017 1608 06/09/2017 1320 Full Code [id number]  Radene Gunning, NP ED   05/06/2017 2352 05/08/2017 1717 Full Code [id number]  Edwin Dada, MD ED   02/27/2017 1448 03/04/2017 1345 Full Code [id number]  Bary Leriche, PA-C Inpatient   02/19/2017 0046 02/27/2017 1426 Full Code [id number]  Carlyle Dolly, MD ED   01/30/2015 1923 01/31/2015 0555 Full Code [id number]  Phillips Grout, MD Inpatient   06/04/2014 1715 06/06/2014 1949 Full Code [id number]  Otho Bellows, MD Inpatient    02/26/2014 2209 03/04/2014 1612 Full Code [id number]  Delfina Redwood, MD Inpatient   04/27/2013 1418 05/01/2013 1530 Full Code [id number]  Louellen Molder, MD Inpatient   08/31/2011 1339 09/01/2011 0030 Full Code [id number]  Lawyer, Resa Miner, PA-C ED         IV Access:   Peripheral IV   Procedures and diagnostic studies:   DG CHEST PORT 1 VIEW  Result Date: 08/22/2021 CLINICAL DATA:  Dyspnea EXAM: PORTABLE CHEST 1 VIEW COMPARISON:  04/01/2021 FINDINGS: Tracheostomy tube in good position. Normal cardiac silhouette. No effusion, infiltrate or pneumothorax. Low lung volumes. IMPRESSION: 1. Tracheostomy tube in good position. 2.  No acute cardiopulmonary process. Electronically Signed   By: Suzy Bouchard M.D.   On: 08/22/2021 08:52     Medical Consultants:   None.   Subjective:    [name] complaining of pain this morning.  Objective:    Vitals:   08/22/21 2335 08/23/21 0340 08/23/21 0810 08/23/21 0849  BP: 97/62 100/69 111/77   Pulse: 99 99 (!) 106 (!) 108  Resp: 19 17 18 17   Temp: 98.9 F (37.2 C) 99.6 F (37.6 C) 98.8 F (37.1 C)   TempSrc: Oral Oral Oral   SpO2: 97% 96% 95% 97%  Weight:      Height:       SpO2: 97 % O2 Flow Rate (L/min): 5 L/min FiO2 (%): 28 %   Intake/Output Summary (Last 24 hours) at 08/23/2021 0930 Last data filed at 08/22/2021 1508 Gross per 24 hour  Intake 2143.66 ml  Output --  Net 2143.66 ml    Filed Weights   08/20/21 0623  Weight: 109 kg    Exam: General exam: In no acute distress. Respiratory system: Good air movement and clear to auscultation. Cardiovascular system: S1 & S2 heard, RRR. No JVD. Gastrointestinal system: Abdomen is nondistended, soft and nontender.  Extremities: No pedal edema. Skin: No rashes, lesions or ulcers Psychiatry: Judgement and insight appear normal. Mood & affect appropriate.   Data Reviewed:    Labs: Basic Metabolic Panel: Recent Labs  Lab 08/21/21 0329  08/21/21 1722 08/22/21 0203 08/22/21 0826 08/23/21 0429  NA 132* 136 137 138 136  K 4.4 3.9 4.4 3.9 3.8  CL 103 106 108 110 111  CO2 22 23 22  21* 21*  GLUCOSE 291* 166* 189* 161* 130*  BUN 15 18 21* 19 13  CREATININE 1.35* 1.52* 1.68* 1.41* 1.25*  CALCIUM 8.1* 7.9* 8.3* 8.2* 8.0*    GFR Estimated Creatinine Clearance: 60.8 mL/min (A) (by C-G formula based on SCr of 1.25 mg/dL (H)). Liver Function Tests: Recent Labs  Lab 08/22/21 0203 08/22/21 0826 08/23/21 0429  AST 33 30 23  ALT 22 22 22   ALKPHOS 90 79 70  BILITOT 0.3 0.5 0.4  PROT 6.4* 6.0* 5.9*  ALBUMIN 2.8* 2.6* 2.3*    No results for input(s): LIPASE, AMYLASE in the last 168 hours. No results for input(s): AMMONIA in the last 168 hours. Coagulation profile Recent Labs  Lab 08/22/21 0826  INR 1.2   COVID-19 Labs  No results for input(s): DDIMER, FERRITIN, LDH, CRP in the last 72 hours.  Lab Results  Component Value Date   SARSCOV2NAA NEGATIVE 08/19/2021   SARSCOV2NAA POSITIVE (A) 09/16/2020   [place]  NEGATIVE 08/26/2020   [place] NEGATIVE 08/09/2020    CBC: Recent Labs  Lab 08/21/21 0329 08/21/21 1722 08/22/21 0203 08/22/21 0826 08/23/21 0429  WBC 13.7* 13.3* 15.6* 15.6* 11.3*  NEUTROABS  --  9.0*  --  11.5*  --   HGB 9.4* 8.7* 9.6* 8.4* 7.7*  HCT 28.9* 26.8* 30.1* 26.5* 24.3*  MCV 88.1 89.0 89.6 88.6 90.3  PLT 298 251 262 247 231    Cardiac Enzymes: No results for input(s): CKTOTAL, CKMB, CKMBINDEX, TROPONINI in the last 168 hours. BNP (last 3 results) No results for input(s): PROBNP in the last 8760 hours. CBG: Recent Labs  Lab 08/22/21 0732 08/22/21 1134 08/22/21 1653 08/22/21 2053 08/23/21 0808  GLUCAP 146* 174* 152* 166* 114*    D-Dimer: No results for input(s): DDIMER in the last 72 hours. Hgb A1c: Recent Labs    08/21/21 0329  HGBA1C 7.4*    Lipid Profile: No results for input(s): CHOL, HDL, LDLCALC, TRIG, CHOLHDL, LDLDIRECT in the last 72 hours. Thyroid  function studies: No results for input(s): TSH, T4TOTAL, T3FREE, THYROIDAB in the last 72 hours.  Invalid input(s): FREET3 Anemia work up: No results for input(s): VITAMINB12, FOLATE, FERRITIN, TIBC, IRON, RETICCTPCT in the last 72 hours. Sepsis Labs: Recent Labs  Lab 08/21/21 1722 08/22/21 0203 08/22/21 0826 08/22/21 1024 08/23/21 0429  PROCALCITON  --   --  1.53  --   --   WBC 13.3* 15.6* 15.6*  --  11.3*  LATICACIDVEN  --   --  0.9 1.0  --     Microbiology Recent Results (from the past 240 hour(s))  Surgical pcr screen     Status: None   Collection Time: 08/13/21 10:27 AM   Specimen: Nasal Mucosa; Nasal Swab  Result Value Ref Range Status   MRSA, PCR NEGATIVE NEGATIVE Final   Staphylococcus aureus NEGATIVE NEGATIVE Final    Comment: (NOTE) The Xpert SA Assay (FDA approved for NASAL specimens in patients 37 years of age and older), is one component of a comprehensive surveillance program. It is not intended to diagnose infection nor to guide or monitor treatment. Performed at [facility], [place] [address] [place], [place] [address]   SARS CORONAVIRUS 2 (TAT 6-24 HRS) Nasopharyngeal Nasopharyngeal Swab     Status: None   Collection Time: 08/19/21 12:52 PM   Specimen: Nasopharyngeal Swab  Result Value Ref Range Status   SARS Coronavirus 2 NEGATIVE NEGATIVE Final    Comment: (NOTE) SARS-CoV-2 target nucleic acids are NOT DETECTED.  The SARS-CoV-2 RNA is generally detectable in upper and lower respiratory specimens during the acute phase of infection. Negative results do not preclude SARS-CoV-2 infection, do not rule out co-infections with other pathogens, and should not be used as the sole basis for treatment or other patient management decisions. Negative results must be combined with clinical observations, patient history, and epidemiological information. The expected result is Negative.  Fact Sheet for  Patients: SugarRoll.be  Fact Sheet for Healthcare Providers: https://www.woods-mathews.com/  This test is not yet approved or cleared by the [place] FDA and  has been authorized for detection and/or diagnosis of SARS-CoV-2 by FDA under an Emergency Use Authorization (EUA). This EUA will remain  in effect (meaning this test can be used) for the duration of the COVID-19 declaration under Se ction 564(b)(1) of the Act, 21 U.S.C. section 360bbb-3(b)(1), unless the authorization is terminated or revoked sooner.  Performed at [facility], [place] [address] [place], [place] [address]  Surgical pcr screen     Status: None   Collection Time: 08/20/21  7:05 AM   Specimen: Nasal Mucosa; Nasal Swab  Result Value Ref Range Status   MRSA, PCR NEGATIVE NEGATIVE Final   Staphylococcus aureus NEGATIVE NEGATIVE Final    Comment: (NOTE) The Xpert SA Assay (FDA approved for NASAL specimens in patients 63 years of age and older), is one component of a comprehensive surveillance program. It is not intended to diagnose infection nor to guide or monitor treatment. Performed at [facility], [place] [address], [place], [place] [address]   Culture, blood (x 2)     Status: None (Preliminary result)   Collection Time: 08/22/21  8:26 AM   Specimen: BLOOD  Result Value Ref Range Status   Specimen Description BLOOD LEFT ANTECUBITAL  Final   Special Requests   Final    BOTTLES DRAWN AEROBIC AND ANAEROBIC Blood Culture adequate volume   Culture   Final    NO GROWTH < 24 HOURS Performed at [facility], [place] [address], [place], [place] [address]    Report Status PENDING  Incomplete  Culture, blood (x 2)     Status: None (Preliminary result)   Collection Time: 08/22/21  8:41 AM   Specimen: BLOOD LEFT HAND  Result Value Ref Range Status   Specimen Description BLOOD LEFT HAND  Final   Special Requests   Final    BOTTLES DRAWN AEROBIC AND  ANAEROBIC Blood Culture adequate volume   Culture   Final    NO GROWTH < 24 HOURS Performed at [facility], [place] [address] [place], [place] [address]    Report Status PENDING  Incomplete     Medications:    atorvastatin  20 mg Oral Daily   buPROPion  150 mg Oral BID   docusate sodium  100 mg Oral BID   gabapentin  600 mg Oral TID   insulin aspart  0-5 Units Subcutaneous QHS   insulin aspart  0-9 Units Subcutaneous TID WC   insulin aspart  2 Units Subcutaneous TID WC   insulin detemir  25 Units Subcutaneous BID   loratadine  10 mg Oral Daily   melatonin  3 mg Oral QHS   mometasone-formoterol  2 puff Inhalation BID   oxyCODONE  10 mg Oral Q12H   pantoprazole  40 mg Oral QHS   sertraline  100 mg Oral QHS   sodium chloride flush  3 mL Intravenous Q12H   sucralfate  1 g Oral QHS   topiramate  150 mg Oral BID   Continuous Infusions:  sodium chloride     sodium chloride 125 mL/hr at 08/22/21 1508   ceFEPime (MAXIPIME) IV 2 g (08/23/21 0926)   methocarbamol (ROBAXIN) IV Stopped (08/22/21 0534)   metronidazole 500 mg (08/23/21 0932)   vancomycin 750 mg (08/23/21 0930)      LOS: 1 day   Charlynne Cousins  [facility]  08/23/2021, 9:30 AM

## 2021-08-24 DIAGNOSIS — R6510 Systemic inflammatory response syndrome (SIRS) of non-infectious origin without acute organ dysfunction: Secondary | ICD-10-CM | POA: Diagnosis not present

## 2021-08-24 DIAGNOSIS — M4326 Fusion of spine, lumbar region: Secondary | ICD-10-CM | POA: Diagnosis not present

## 2021-08-24 DIAGNOSIS — N179 Acute kidney failure, unspecified: Secondary | ICD-10-CM | POA: Diagnosis not present

## 2021-08-24 DIAGNOSIS — M4316 Spondylolisthesis, lumbar region: Secondary | ICD-10-CM | POA: Diagnosis not present

## 2021-08-24 LAB — COMPREHENSIVE METABOLIC PANEL
ALT: 21 U/L (ref 0–44)
AST: 20 U/L (ref 15–41)
Albumin: 2.4 g/dL — ABNORMAL LOW (ref 3.5–5.0)
Alkaline Phosphatase: 88 U/L (ref 38–126)
Anion gap: 4 — ABNORMAL LOW (ref 5–15)
BUN: 8 mg/dL (ref 6–20)
CO2: 23 mmol/L (ref 22–32)
Calcium: 8.6 mg/dL — ABNORMAL LOW (ref 8.9–10.3)
Chloride: 110 mmol/L (ref 98–111)
Creatinine, Ser: 1.14 mg/dL — ABNORMAL HIGH (ref 0.44–1.00)
GFR, Estimated: 58 mL/min — ABNORMAL LOW (ref >60)
Glucose, Bld: 183 mg/dL — ABNORMAL HIGH (ref 70–99)
Potassium: 4.1 mmol/L (ref 3.5–5.1)
Sodium: 137 mmol/L (ref 135–145)
Total Bilirubin: 0.6 mg/dL (ref 0.3–1.2)
Total Protein: 6.2 g/dL — ABNORMAL LOW (ref 6.5–8.1)

## 2021-08-24 LAB — GLUCOSE, CAPILLARY
Glucose-Capillary: 173 mg/dL — ABNORMAL HIGH (ref 70–99)
Glucose-Capillary: 147 mg/dL — ABNORMAL HIGH (ref 70–99)
Glucose-Capillary: 102 mg/dL — ABNORMAL HIGH (ref 70–99)
Glucose-Capillary: 133 mg/dL — ABNORMAL HIGH (ref 70–99)

## 2021-08-24 LAB — CBC
HCT: 25.5 % — ABNORMAL LOW (ref 36.0–46.0)
Hemoglobin: 8.1 g/dL — ABNORMAL LOW (ref 12.0–15.0)
MCH: 28.2 pg (ref 26.0–34.0)
MCHC: 31.8 g/dL (ref 30.0–36.0)
MCV: 88.9 fL (ref 80.0–100.0)
Platelets: 286 10*3/uL (ref 150–400)
RBC: 2.87 MIL/uL — ABNORMAL LOW (ref 3.87–5.11)
RDW: 14 % (ref 11.5–15.5)
WBC: 11 10*3/uL — ABNORMAL HIGH (ref 4.0–10.5)
nRBC: 0 % (ref 0.0–0.2)

## 2021-08-24 MED ORDER — ALPRAZOLAM 0.25 MG PO TABS
0.2500 mg | ORAL_TABLET | Freq: Two times a day (BID) | ORAL | Status: DC | PRN
  Administered 2021-08-24: 0.25 mg via ORAL
  Filled 2021-08-24: qty 1

## 2021-08-24 MED ORDER — METOPROLOL TARTRATE 12.5 MG HALF TABLET
12.5000 mg | ORAL_TABLET | Freq: Two times a day (BID) | ORAL | Status: DC
  Administered 2021-08-24 – 2021-08-26 (×4): 12.5 mg via ORAL
  Filled 2021-08-24 (×4): qty 1

## 2021-08-24 MED ORDER — ASPIRIN EC 81 MG PO TBEC
81.0000 mg | DELAYED_RELEASE_TABLET | Freq: Every day | ORAL | Status: DC
  Administered 2021-08-25 – 2021-08-26 (×2): 81 mg via ORAL
  Filled 2021-08-24 (×2): qty 1

## 2021-08-24 NOTE — Progress Notes (Signed)
[facility] CONSULT PROGRESS NOTE    Progress Note  [name]  SNK:[id number] DOB: 1970/11/28 DOA: 08/20/2021 PCP: [name], MD     Brief Narrative:   [name] is an 50 y.o. female past medical history significant for diabetes mellitus type 2, essential hypertension, obstructive sleep apnea depression status post tracheostomy secondary to previous intubation related tracheal injury leading to subglottic stenosis on 2 L nasal cannula at night admitted by orthopedic surgery for intractable back pain and right lower extremity radiculopathy underwent right L4-L5 transforaminal lumbar interbody fusion with rods and screws and cage with local bone graft on 08/20/2021, she tolerated the procedure well postoperatively developed hypotension thought to be secondary to hypovolemia in nature in the setting of narcotic induced ongoing uncontrolled pain.   Assessment/Plan:   SIRS: Continues to remain afebrile, CBC is pending this morning. Continue IV Vanco and cefepime. Culture data has been negative till date. If they continue to to remain negative will change to oral tomorrow. Relates her pain today is much improved.  Acute kidney injury on chronic kidney disease stage III: With a baseline creatinine 1.1-1.2. Multifactorial in the setting of NSAIDs, sepsis and SIRS reaction. These were held. Basic metabolic panels pending, IV fluids have been held.  Hyponatremia possibly hypovolemic: slight resolved with IV fluid hydration.  Diabetes mellitus type 2: She relates she does not have an appetite, A1c was 7.4. Blood glucose control is improved continue long-acting insulin plus sliding scale.   Obesity hypoventilation syndrome/obstructive sleep apnea/chronic tracheostomy related to subglottic stenosis: Continue inhalers and supplemental oxygen at night.  Intractable back pain and right lower extremity radiculopathy present on  admission: Status post lumbar fusion 08/20/2021. Defer further management per orthopedic surgery. She relates her pain is not controlled, continue narcotics increase IV hydromorphone. Avoid NSAIDs.  Continue Robaxin and narcotics. Physical therapy evaluated the patient and recommended home health PT.  Anxiety/depression: Continue Wellbutrin Zoloft and Topamax.  DVT prophylaxis: lovenox Family Communication:none Status is: Observation  The patient will require care spanning > 2 midnights and should be moved to inpatient because: Significant hypotension with tachycardia worsening leukocytosis concern about sepsis etiology       Code Status:     Code Status Orders  (From admission, onward)           Start     Ordered   08/20/21 1527  Full code  Continuous        08/20/21 1527           Code Status History     Date Active Date Inactive Code Status Order ID Comments User Context   09/16/2020 2210 09/22/2020 0048 Full Code [id number]  Daisy Floro, DO ED   08/27/2020 0030 08/29/2020 2101 Full Code [id number]  Wilber Oliphant, MD ED   08/26/2020 2302 08/27/2020 0029 Full Code [id number]  Ezequiel Essex, MD ED   07/28/2019 1733 07/29/2019 1921 Full Code [id number]  Donella Stade, PA-C Inpatient   03/22/2019 0903 03/26/2019 2049 Full Code [id number]  Karmen Bongo, MD Inpatient   10/24/2018 0013 10/29/2018 1918 Full Code [id number]  [name], MD Inpatient   03/05/2018 2110 03/12/2018 0058 Full Code [id number]  Jule Ser, DO ED   06/02/2017 1608 06/09/2017 1320 Full Code [id number]  Radene Gunning, NP ED   05/06/2017 2352 05/08/2017 1717 Full Code [id number]  Edwin Dada, MD ED   02/27/2017 1448 03/04/2017 1345 Full Code [id number]  Bary Leriche, PA-C Inpatient   02/19/2017 [phone]  02/27/2017 1426 Full Code [id number]  Carlyle Dolly, MD ED   01/30/2015 1923 01/31/2015 0555 Full Code [id number]  Phillips Grout, MD Inpatient   06/04/2014 1715 06/06/2014 1949 Full Code  [id number]  Otho Bellows, MD Inpatient   02/26/2014 2209 03/04/2014 1612 Full Code [id number]  Delfina Redwood, MD Inpatient   04/27/2013 1418 05/01/2013 1530 Full Code [id number]  Louellen Molder, MD Inpatient   08/31/2011 1339 09/01/2011 0030 Full Code [id number]  Lawyer, Resa Miner, PA-C ED         IV Access:   Peripheral IV   Procedures and diagnostic studies:   DG CHEST PORT 1 VIEW  Result Date: 08/22/2021 CLINICAL DATA:  Dyspnea EXAM: PORTABLE CHEST 1 VIEW COMPARISON:  04/01/2021 FINDINGS: Tracheostomy tube in good position. Normal cardiac silhouette. No effusion, infiltrate or pneumothorax. Low lung volumes. IMPRESSION: 1. Tracheostomy tube in good position. 2.  No acute cardiopulmonary process. Electronically Signed   By: Suzy Bouchard M.D.   On: 08/22/2021 08:52     Medical Consultants:   None.   Subjective:    [name] relates her pain is improved today.  Objective:    Vitals:   08/23/21 2309 08/23/21 2312 08/24/21 0320 08/24/21 0341  BP: (!) 150/75   99/84  Pulse: (!) 107 (!) 107 (!) 113 (!) 107  Resp: 15 20 19 18   Temp: 99.3 F (37.4 C)   99 F (37.2 C)  TempSrc: Oral   Oral  SpO2: 95% 95% 94% 95%  Weight:      Height:       SpO2: 95 % O2 Flow Rate (L/min): 5 L/min FiO2 (%): 28 %   Intake/Output Summary (Last 24 hours) at 08/24/2021 0701 Last data filed at 08/24/2021 0612 Gross per 24 hour  Intake 890 ml  Output --  Net 890 ml    Filed Weights   08/20/21 0623  Weight: 109 kg    Exam: General exam: In no acute distress. Respiratory system: Good air movement and clear to auscultation. Cardiovascular system: S1 & S2 heard, RRR. No JVD. Gastrointestinal system: Abdomen is nondistended, soft and nontender.  Extremities: No pedal edema. Skin: No rashes, lesions or ulcers Psychiatry: Judgement and insight appear normal. Mood & affect appropriate. Data Reviewed:    Labs: Basic Metabolic Panel: Recent Labs  Lab  08/21/21 0329 08/21/21 1722 08/22/21 0203 08/22/21 0826 08/23/21 0429  NA 132* 136 137 138 136  K 4.4 3.9 4.4 3.9 3.8  CL 103 106 108 110 111  CO2 22 23 22  21* 21*  GLUCOSE 291* 166* 189* 161* 130*  BUN 15 18 21* 19 13  CREATININE 1.35* 1.52* 1.68* 1.41* 1.25*  CALCIUM 8.1* 7.9* 8.3* 8.2* 8.0*    GFR Estimated Creatinine Clearance: 60.8 mL/min (A) (by C-G formula based on SCr of 1.25 mg/dL (H)). Liver Function Tests: Recent Labs  Lab 08/22/21 0203 08/22/21 0826 08/23/21 0429  AST 33 30 23  ALT 22 22 22   ALKPHOS 90 79 70  BILITOT 0.3 0.5 0.4  PROT 6.4* 6.0* 5.9*  ALBUMIN 2.8* 2.6* 2.3*    No results for input(s): LIPASE, AMYLASE in the last 168 hours. No results for input(s): AMMONIA in the last 168 hours. Coagulation profile Recent Labs  Lab 08/22/21 0826  INR 1.2    COVID-19 Labs  No results for input(s): DDIMER, FERRITIN, LDH, CRP in the last 72 hours.  Lab Results  Component Value Date   Ray NEGATIVE 08/19/2021   SARSCOV2NAA  POSITIVE (A) 09/16/2020   SARSCOV2NAA NEGATIVE 08/26/2020   [place] NEGATIVE 08/09/2020    CBC: Recent Labs  Lab 08/21/21 1722 08/22/21 0203 08/22/21 0826 08/23/21 0429 08/23/21 1507  WBC 13.3* 15.6* 15.6* 11.3* 11.7*  NEUTROABS 9.0*  --  11.5*  --  7.4  HGB 8.7* 9.6* 8.4* 7.7* 8.7*  HCT 26.8* 30.1* 26.5* 24.3* 27.2*  MCV 89.0 89.6 88.6 90.3 89.2  PLT 251 262 247 231 277    Cardiac Enzymes: No results for input(s): CKTOTAL, CKMB, CKMBINDEX, TROPONINI in the last 168 hours. BNP (last 3 results) No results for input(s): PROBNP in the last 8760 hours. CBG: Recent Labs  Lab 08/22/21 2053 08/23/21 0808 08/23/21 1201 08/23/21 1643 08/23/21 2110  GLUCAP 166* 114* 80 129* 143*    D-Dimer: No results for input(s): DDIMER in the last 72 hours. Hgb A1c: No results for input(s): HGBA1C in the last 72 hours.  Lipid Profile: No results for input(s): CHOL, HDL, LDLCALC, TRIG, CHOLHDL, LDLDIRECT in the last  72 hours. Thyroid function studies: No results for input(s): TSH, T4TOTAL, T3FREE, THYROIDAB in the last 72 hours.  Invalid input(s): FREET3 Anemia work up: No results for input(s): VITAMINB12, FOLATE, FERRITIN, TIBC, IRON, RETICCTPCT in the last 72 hours. Sepsis Labs: Recent Labs  Lab 08/22/21 0203 08/22/21 0826 08/22/21 1024 08/23/21 0429 08/23/21 1507  PROCALCITON  --  1.53  --   --   --   WBC 15.6* 15.6*  --  11.3* 11.7*  LATICACIDVEN  --  0.9 1.0  --   --     Microbiology Recent Results (from the past 240 hour(s))  SARS CORONAVIRUS 2 (TAT 6-24 HRS) Nasopharyngeal Nasopharyngeal Swab     Status: None   Collection Time: 08/19/21 12:52 PM   Specimen: Nasopharyngeal Swab  Result Value Ref Range Status   SARS Coronavirus 2 NEGATIVE NEGATIVE Final    Comment: (NOTE) SARS-CoV-2 target nucleic acids are NOT DETECTED.  The SARS-CoV-2 RNA is generally detectable in upper and lower respiratory specimens during the acute phase of infection. Negative results do not preclude SARS-CoV-2 infection, do not rule out co-infections with other pathogens, and should not be used as the sole basis for treatment or other patient management decisions. Negative results must be combined with clinical observations, patient history, and epidemiological information. The expected result is Negative.  Fact Sheet for Patients: SugarRoll.be  Fact Sheet for Healthcare Providers: https://www.woods-mathews.com/  This test is not yet approved or cleared by the [place] FDA and  has been authorized for detection and/or diagnosis of SARS-CoV-2 by FDA under an Emergency Use Authorization (EUA). This EUA will remain  in effect (meaning this test can be used) for the duration of the COVID-19 declaration under Se ction 564(b)(1) of the Act, 21 U.S.C. section 360bbb-3(b)(1), unless the authorization is terminated or revoked sooner.  Performed at [facility], [place] [address] [place], [place] [address]   Surgical pcr screen     Status: None   Collection Time: 08/20/21  7:05 AM   Specimen: Nasal Mucosa; Nasal Swab  Result Value Ref Range Status   MRSA, PCR NEGATIVE NEGATIVE Final   Staphylococcus aureus NEGATIVE NEGATIVE Final    Comment: (NOTE) The Xpert SA Assay (FDA approved for NASAL specimens in patients 52 years of age and older), is one component of a comprehensive surveillance program. It is not intended to diagnose infection nor to guide or monitor treatment. Performed at [facility], [place],  [place] [address]   Culture, blood (x 2)     Status: None (Preliminary result)   Collection Time: 08/22/21  8:26 AM   Specimen: BLOOD  Result Value Ref Range Status   Specimen Description BLOOD LEFT ANTECUBITAL  Final   Special Requests   Final    BOTTLES DRAWN AEROBIC AND ANAEROBIC Blood Culture adequate volume   Culture   Final    NO GROWTH < 24 HOURS Performed at [facility], [place] [address], [place], [place] [address]    Report Status PENDING  Incomplete  Culture, blood (x 2)     Status: None (Preliminary result)   Collection Time: 08/22/21  8:41 AM   Specimen: BLOOD LEFT HAND  Result Value Ref Range Status   Specimen Description BLOOD LEFT HAND  Final   Special Requests   Final    BOTTLES DRAWN AEROBIC AND ANAEROBIC Blood Culture adequate volume   Culture   Final    NO GROWTH < 24 HOURS Performed at [facility], [place] [address], [place], [place] [address]    Report Status PENDING  Incomplete     Medications:    atorvastatin  20 mg Oral Daily   buPROPion  150 mg Oral BID   docusate sodium  100 mg Oral BID   ferrous gluconate  324 mg Oral BID WC   gabapentin  600 mg Oral BID   insulin aspart  0-5 Units Subcutaneous QHS   insulin aspart  0-9 Units Subcutaneous TID WC   insulin aspart  2 Units Subcutaneous TID WC   insulin detemir  25 Units Subcutaneous BID   loratadine  10 mg  Oral Daily   melatonin  3 mg Oral QHS   mometasone-formoterol  2 puff Inhalation BID   oxyCODONE  20 mg Oral Q12H   pantoprazole  40 mg Oral QHS   polyethylene glycol  17 g Oral BID   sertraline  100 mg Oral QHS   sodium chloride flush  3 mL Intravenous Q12H   sucralfate  1 g Oral QHS   topiramate  150 mg Oral BID   Continuous Infusions:  sodium chloride     sodium chloride 10 mL/hr at 08/23/21 0938   ceFEPime (MAXIPIME) IV 2 g (08/24/21 0100)   methocarbamol (ROBAXIN) IV Stopped (08/22/21 0534)   vancomycin 750 mg (08/23/21 2245)      LOS: 2 days   Charlynne Cousins  [facility]  08/24/2021, 7:01 AM

## 2021-08-24 NOTE — TOC Progression Note (Signed)
Transition of Care [facility]) - Progression Note    Patient Details  Name: [name] MRN: [id number] Date of Birth: May 19, 1971  Transition [facility] CM/SW Contact  Bartholomew Crews, RN Phone Number: [phone] 08/24/2021, 1:21 PM  Clinical Narrative:     Spoke with patient at the bedside. Patient able to gesture that she wants to be evaluated by inpatient rehab for post acute transition.   [facility] following for home health PT, OT, SLP if unable to receive services for inpatient rehab.   TOC following for transition needs.   Expected Discharge Plan: Home/Self Care Barriers to Discharge: Continued Medical Work up  Expected Discharge Plan and Services Expected Discharge Plan: Home/Self Care   Discharge Planning Services: CM Consult Post Acute Care Choice: Durable Medical Equipment, Home Health Living arrangements for the past 2 months: Apartment                 DME Arranged: Walker rolling DME Agency: [employer] Date DME Agency Contacted: 08/21/21 Time DME Agency Contacted: [phone] Representative spoke with at DME Agency: Early: PT [place]: [place] ([place]) Date [place]: 08/21/21 Time [place]: 1344 Representative spoke with at [place]: Ramond Marrow (pending)   Social Determinants of Health ([place]) Interventions    Readmission Risk Interventions No flowsheet data found.

## 2021-08-24 NOTE — Progress Notes (Addendum)
SLP Note  Patient Details Name: [name] MRN: [id number] DOB: April 09, 1971   Orders for PMV evaluation and treatment were received.  Per patient she plugs her trach off during the day when she needs to speak using a plug.  Plug was present but slightly damaged. Patient reported that she has tried to use a PMV but is unable to wear it due to difficulty breathing and that she only uses the plug intermittently for short periods of speech production.  Per RN the patient is 100% independent in the management of all her trach care/needs.  Suggested to RN to contact respiratory therapy to get replacement plug/cap.  RT came with a cap to replace her existing damaged plug.  Dicussed with RT and decision was made for the patient to use finger occlusion or intermittent capping for speech purposes.  Given this ST deferred PM evaluation.  If ST services can be of further assistance please feel free to reconsult.     Shelly Flatten, MA, [place] [facility] SLP [phone]  [name] 08/24/2021, 11:08 AM

## 2021-08-24 NOTE — Progress Notes (Signed)
     Subjective: 4 Days Post-Op Procedure(s) (LRB): RIGHT L4-5 TRANSFORAMINAL LUMBAR INTERBODY FUSION WITH RODS, SCREWS AND CAGE, LOCAL BONE GRAFT, ALLOGRAFT BONE GRAFT, VIVIGEN (N/A) Patient is extremely slow with PT, she is demonstrating very protected movement and seems to be very cautious to the extreme in her case and I suspect that there is a level of "white coat syndrome and anxiety that is slowing her progress". None the less she has anemia that is borderline for any tranfusion 8.7, the 7.7 value seems to be out of sync with other hgb values and it will be rechecked.  Patient reports pain as moderate.    Objective:   VITALS:  Temp:  [99 F (37.2 C)-99.7 F (37.6 C)] 99.3 F (37.4 C) (12/03 0813) Pulse Rate:  [103-119] 104 (12/03 0813) Resp:  [15-20] 20 (12/03 0813) BP: (99-163)/(68-104) 163/104 (12/03 0813) SpO2:  [94 %-100 %] 99 % (12/03 0813)  Neurologically intact ABD soft Neurovascular intact Sensation intact distally Intact pulses distally Dorsiflexion/Plantar flexion intact Incision: dressing C/D/I, no drainage, and scant drainage No cellulitis present   LABS Recent Labs    08/22/21 0203 08/22/21 0826 08/23/21 0429 08/23/21 1507 08/24/21 0636  HGB 9.6* 8.4* 7.7* 8.7* 8.1*  WBC 15.6* 15.6* 11.3* 11.7* 11.0*  PLT 262 247 231 277 286   Recent Labs    08/23/21 0429 08/24/21 0636  NA 136 137  K 3.8 4.1  CL 111 110  CO2 21* 23  BUN 13 8  CREATININE 1.25* 1.14*  GLUCOSE 130* 183*   Recent Labs    08/22/21 0826  INR 1.2     Assessment/Plan: 4 Days Post-Op Procedure(s) (LRB): RIGHT L4-5 TRANSFORAMINAL LUMBAR INTERBODY FUSION WITH RODS, SCREWS AND CAGE, LOCAL BONE GRAFT, ALLOGRAFT BONE GRAFT, VIVIGEN (N/A) Kidney injury improving Sepsis, origin surgical site or pulmonary, early and responding to antibiotics. Post op slow with PT/OT with high levels of anxiety and guarded response on the part of patient will begin looking into perioperative SNF  placement to improve her overall function prior to going home, Ask CIR to evaluate as she has significant comorbidities.   Advance diet Up with therapy D/C IV fluids Continue ABX therapy due to Post-op infection presumeably upper respiratory as she has a tracheostomy and has a higher risk, some blood at the ostomy post op suggests that there likely was positional irritation of the tracheostomy site intra op. Antibiotics have been having a positive effect on this patients laboratory tests for sepsis.  Recheck Hgb. Up to a bed side commode. Need to try and assist in bowel regimen and laxative.   [name] 08/24/2021, 8:58 AM Patient ID: [name], female   DOB: [birth year], 50 y.o.   MRN: [id number]

## 2021-08-24 NOTE — Progress Notes (Signed)
Physical Therapy Treatment Patient Details Name: [name] MRN: [id number] DOB: 1971-03-09 Today's Date: 08/24/2021   History of Present Illness 50 y/o female admitted following R L4-5 transforaminal lumbar fusion on 11/29. PMH: HTN, COPD, depression, DM type 2, subglottic stenosis and tracheal stenosis s/p tracheostomy (current)    PT Comments    Patient making slow progress towards physical therapy goals. Session limited by increased pain. Patient ambulated 10' x 2 with min guard and RW but very slow gait speed. MaxA to don brace at beginning of session. Encouraged OOB to bathroom during day and night for increased mobility. Updated recommendation to CIR.     Recommendations for follow up therapy are one component of a multi-disciplinary discharge planning process, led by the attending physician.  Recommendations may be updated based on patient status, additional functional criteria and insurance authorization.  Follow Up Recommendations  Acute inpatient rehab (3hours/day)     Assistance Recommended at Discharge Frequent or constant Supervision/Assistance  Equipment Recommendations  Rolling Tadeo Besecker (2 wheels);BSC/3in1;Wheelchair cushion (measurements PT);Wheelchair (measurements PT)    Recommendations for Other Services       Precautions / Restrictions Precautions Precautions: Fall;Back Precaution Booklet Issued: No Precaution Comments: old trach; verbally reviewed back precautions, pt has a small orange carrot shape to occlude trach for speaking Required Braces or Orthoses: Spinal Brace Spinal Brace: Lumbar corset;Applied in sitting position     Mobility  Bed Mobility Overal bed mobility: Needs Assistance Bed Mobility: Rolling;Sidelying to Sit Rolling: Min assist Sidelying to sit: Min assist       General bed mobility comments: minA for trunk elevation into sitting    Transfers Overall transfer level: Needs assistance Equipment used: Rolling  Donnalyn Juran (2 wheels) Transfers: Sit to/from Stand Sit to Stand: Min assist           General transfer comment: minA to rise from low bed surface. Min guard to stand from commode    Ambulation/Gait Ambulation/Gait assistance: Min guard Gait Distance (Feet): 10 Feet (x10') Assistive device: Rolling Betty Daidone (2 wheels) Gait Pattern/deviations: Step-to pattern;Decreased stride length;Wide base of support Gait velocity: decreased     General Gait Details: slow gait with standing rest breaks due to pain   Stairs             Wheelchair Mobility    Modified Rankin (Stroke Patients Only)       Balance Overall balance assessment: Needs assistance Sitting-balance support: Bilateral upper extremity supported;Feet supported Sitting balance-[name]: Fair     Standing balance support: Bilateral upper extremity supported;During functional activity;Single extremity supported;No upper extremity supported Standing balance-[name]: Poor Standing balance comment: reliant on UE support and external assist                            Cognition Arousal/Alertness: Awake/alert Behavior During Therapy: WFL for tasks assessed/performed Overall Cognitive Status: Difficult to assess                                 General Comments: Following commands. maintaining conversation. Apears baseline. Feel possible decreased medical awareness.        Exercises      General Comments        Pertinent Vitals/Pain Pain Assessment: Faces Faces Pain Scale: Hurts whole lot Pain Location: back Pain Descriptors / Indicators: Grimacing;Guarding Pain Intervention(s): Limited activity within patient's tolerance;Monitored during session;Repositioned    Home Living  Living Arrangements: Spouse/significant other Available Help at Discharge: Family;Available 24 hours/day Type of Home: Apartment Home Access: Level entry     Alternate Level Stairs-Number of Steps:  13 Home Layout: Two level        Prior Function            PT Goals (current goals can now be found in the care plan section) Acute Rehab PT Goals Patient Stated Goal: reduce pain PT Goal Formulation: With patient Time For Goal Achievement: 09/04/21 Potential to Achieve Goals: Good Progress towards PT goals: Progressing toward goals    Frequency    Min 5X/week      PT Plan Discharge plan needs to be updated    Co-evaluation              AM-PAC PT "6 Clicks" Mobility   Outcome Measure  Help needed turning from your back to your side while in a flat bed without using bedrails?: A Little Help needed moving from lying on your back to sitting on the side of a flat bed without using bedrails?: A Little Help needed moving to and from a bed to a chair (including a wheelchair)?: A Little Help needed standing up from a chair using your arms (e.g., wheelchair or bedside chair)?: A Little Help needed to walk in hospital room?: A Little Help needed climbing 3-5 steps with a railing? : A Lot 6 Click Score: 17    End of Session Equipment Utilized During Treatment: Back brace Activity Tolerance: Patient limited by pain Patient left: in chair;with call bell/phone within reach Nurse Communication: Mobility status PT Visit Diagnosis: Unsteadiness on feet (R26.81);Muscle weakness (generalized) (M62.81);Difficulty in walking, not elsewhere classified (R26.2)     Time: 2841-3244 PT Time Calculation (min) (ACUTE ONLY): 24 min  Charges:  $Gait Training: 8-22 mins $Therapeutic Activity: 8-22 mins                     Lance [place] A. Gilford Rile PT, DPT Acute Rehabilitation Services Pager [phone] Office [phone]    [name] 08/24/2021, 5:59 PM

## 2021-08-24 NOTE — Progress Notes (Signed)
Inpatient Rehab Admissions:  Inpatient Rehab Consult received.  I met with patient at the bedside for rehabilitation assessment and to discuss goals and expectations of an inpatient rehab admission.  Pt acknowledged understanding of CIR goals and expectations. Pt interested in pursuing CIR. Discussed with pt that if she continues to progress with therapies and reaches supervision level, she will be too high functioning to warrant a potential CIR admission. Note therapy recommendations for [facility]. Would appreciate updated therapy notes. Will continue to follow.  Signed: Gayland Curry, [place], [place] Admissions Coordinator [phone]

## 2021-08-25 DIAGNOSIS — N179 Acute kidney failure, unspecified: Secondary | ICD-10-CM | POA: Diagnosis not present

## 2021-08-25 DIAGNOSIS — R6510 Systemic inflammatory response syndrome (SIRS) of non-infectious origin without acute organ dysfunction: Secondary | ICD-10-CM | POA: Diagnosis not present

## 2021-08-25 DIAGNOSIS — M4316 Spondylolisthesis, lumbar region: Secondary | ICD-10-CM | POA: Diagnosis not present

## 2021-08-25 DIAGNOSIS — M4326 Fusion of spine, lumbar region: Secondary | ICD-10-CM | POA: Diagnosis not present

## 2021-08-25 LAB — GLUCOSE, CAPILLARY
Glucose-Capillary: 177 mg/dL — ABNORMAL HIGH (ref 70–99)
Glucose-Capillary: 132 mg/dL — ABNORMAL HIGH (ref 70–99)
Glucose-Capillary: 153 mg/dL — ABNORMAL HIGH (ref 70–99)
Glucose-Capillary: 85 mg/dL (ref 70–99)
Glucose-Capillary: 136 mg/dL — ABNORMAL HIGH (ref 70–99)

## 2021-08-25 LAB — COMPREHENSIVE METABOLIC PANEL
ALT: 23 U/L (ref 0–44)
AST: 19 U/L (ref 15–41)
Albumin: 2.4 g/dL — ABNORMAL LOW (ref 3.5–5.0)
Alkaline Phosphatase: 84 U/L (ref 38–126)
Anion gap: 10 (ref 5–15)
BUN: 8 mg/dL (ref 6–20)
CO2: 22 mmol/L (ref 22–32)
Calcium: 8.7 mg/dL — ABNORMAL LOW (ref 8.9–10.3)
Chloride: 104 mmol/L (ref 98–111)
Creatinine, Ser: 1.1 mg/dL — ABNORMAL HIGH (ref 0.44–1.00)
GFR, Estimated: >60 mL/min (ref >60)
Glucose, Bld: 141 mg/dL — ABNORMAL HIGH (ref 70–99)
Potassium: 3.8 mmol/L (ref 3.5–5.1)
Sodium: 136 mmol/L (ref 135–145)
Total Bilirubin: 0.6 mg/dL (ref 0.3–1.2)
Total Protein: 6.2 g/dL — ABNORMAL LOW (ref 6.5–8.1)

## 2021-08-25 LAB — CBC
HCT: 25.2 % — ABNORMAL LOW (ref 36.0–46.0)
Hemoglobin: 8.1 g/dL — ABNORMAL LOW (ref 12.0–15.0)
MCH: 28.2 pg (ref 26.0–34.0)
MCHC: 32.1 g/dL (ref 30.0–36.0)
MCV: 87.8 fL (ref 80.0–100.0)
Platelets: 331 10*3/uL (ref 150–400)
RBC: 2.87 MIL/uL — ABNORMAL LOW (ref 3.87–5.11)
RDW: 13.7 % (ref 11.5–15.5)
WBC: 10.4 10*3/uL (ref 4.0–10.5)
nRBC: 0.3 % — ABNORMAL HIGH (ref 0.0–0.2)

## 2021-08-25 MED ORDER — AMOXICILLIN-POT CLAVULANATE 875-125 MG PO TABS: 1.0000 | ORAL_TABLET | Freq: Two times a day (BID) | ORAL | Status: DC

## 2021-08-25 MED ORDER — AMOXICILLIN-POT CLAVULANATE 875-125 MG PO TABS
1.0000 | ORAL_TABLET | Freq: Two times a day (BID) | ORAL | Status: DC
  Administered 2021-08-25 – 2021-08-26 (×3): 1 via ORAL
  Filled 2021-08-25 (×4): qty 1

## 2021-08-25 NOTE — Progress Notes (Signed)
[facility] CONSULT PROGRESS NOTE    Progress Note  [name]  DZH:[id number] DOB: 12/18/1970 DOA: 08/20/2021 PCP: [name], MD     Brief Narrative:   [name] is an 50 y.o. female past medical history significant for diabetes mellitus type 2, essential hypertension, obstructive sleep apnea depression status post tracheostomy secondary to previous intubation related tracheal injury leading to subglottic stenosis on 2 L nasal cannula at night admitted by orthopedic surgery for intractable back pain and right lower extremity radiculopathy underwent right L4-L5 transforaminal lumbar interbody fusion with rods and screws and cage with local bone graft on 08/20/2021, she tolerated the procedure well postoperatively developed hypotension thought to be secondary to hypovolemia in nature in the setting of narcotic induced ongoing uncontrolled pain.   Assessment/Plan:   SIRS: Remains afebrile leukocytosis is resolved. Incision her to oral Augmentin Culture data has been negative till date. If they continue to to remain negative will change to oral tomorrow. Relates her pain today is much improved.  Acute kidney injury on chronic kidney disease stage III: With a baseline creatinine 1.1-1.2. Multifactorial in the setting of NSAIDs  and SIRS reaction.  Hyponatremia possibly hypovolemic: slight resolved with IV fluid hydration.  Diabetes mellitus type 2: She relates she does not have an appetite, A1c was 7.4. Blood glucose control is improved continue long-acting insulin plus sliding scale.   Obesity hypoventilation syndrome/obstructive sleep apnea/chronic tracheostomy related to subglottic stenosis: Continue inhalers and supplemental oxygen at night.  Intractable back pain and right lower extremity radiculopathy present on admission: Status post lumbar fusion 08/20/2021. Defer further management per orthopedic surgery. She relates her  pain is not controlled, continue narcotics increase IV hydromorphone. Avoid NSAIDs.  Continue Robaxin and narcotics. She continues to complain of pain but she is able to get up, going to the bathroom and tolerating her diet.  Anxiety/depression: Continue Wellbutrin Zoloft and Topamax.  DVT prophylaxis: lovenox Family Communication:none Status is: Observation  The patient will require care spanning > 2 midnights and should be moved to inpatient because: Significant hypotension with tachycardia worsening leukocytosis concern about sepsis etiology       Code Status:     Code Status Orders  (From admission, onward)           Start     Ordered   08/20/21 1527  Full code  Continuous        08/20/21 1527           Code Status History     Date Active Date Inactive Code Status Order ID Comments User Context   09/16/2020 2210 09/22/2020 0048 Full Code [id number]  Daisy Floro, DO ED   08/27/2020 0030 08/29/2020 2101 Full Code [id number]  Wilber Oliphant, MD ED   08/26/2020 2302 08/27/2020 0029 Full Code [id number]  Ezequiel Essex, MD ED   07/28/2019 1733 07/29/2019 1921 Full Code [id number]  Donella Stade, PA-C Inpatient   03/22/2019 0903 03/26/2019 2049 Full Code [id number]  Karmen Bongo, MD Inpatient   10/24/2018 0013 10/29/2018 1918 Full Code [id number]  [name], MD Inpatient   03/05/2018 2110 03/12/2018 0058 Full Code [id number]  Jule Ser, DO ED   06/02/2017 1608 06/09/2017 1320 Full Code [id number]  Radene Gunning, NP ED   05/06/2017 2352 05/08/2017 1717 Full Code [id number]  Edwin Dada, MD ED   02/27/2017 1448 03/04/2017 1345 Full Code [id number]  Bary Leriche, PA-C Inpatient   02/19/2017 0046 02/27/2017 1426 Full Code [id number]  Carlyle Dolly, MD ED   01/30/2015 1923 01/31/2015 0555 Full Code [id number]  Phillips Grout, MD Inpatient   06/04/2014 1715 06/06/2014 1949 Full Code [id number]  Otho Bellows, MD Inpatient   02/26/2014 2209 03/04/2014 1612 Full Code  [id number]  Delfina Redwood, MD Inpatient   04/27/2013 1418 05/01/2013 1530 Full Code [id number]  Louellen Molder, MD Inpatient   08/31/2011 1339 09/01/2011 0030 Full Code [id number]  Lawyer, Resa Miner, PA-C ED         IV Access:   Peripheral IV   Procedures and diagnostic studies:   No results found.   Medical Consultants:   None.   Subjective:    [name] relates her pain is not improved.  Objective:    Vitals:   08/24/21 2310 08/24/21 2343 08/25/21 0250 08/25/21 0256  BP: 136/83   139/67  Pulse: (!) 102 (!) 101 (!) 101 93  Resp: 20 20 20 20   Temp: 99.2 F (37.3 C)   99.6 F (37.6 C)  TempSrc: Oral   Oral  SpO2: 95% 94% 95% 98%  Weight:      Height:       SpO2: 98 % O2 Flow Rate (L/min): 5 L/min FiO2 (%): 28 %   Intake/Output Summary (Last 24 hours) at 08/25/2021 0717 Last data filed at 08/25/2021 0249 Gross per 24 hour  Intake 1090 ml  Output 450 ml  Net 640 ml    Filed Weights   08/20/21 0623  Weight: 109 kg    Exam: General exam: In no acute distress. Respiratory system: Good air movement and clear to auscultation. Cardiovascular system: S1 & S2 heard, RRR. No JVD. Gastrointestinal system: Abdomen is nondistended, soft and nontender.  Extremities: No pedal edema. Skin: No rashes, lesions or ulcers Psychiatry: Judgement and insight appear normal. Mood & affect appropriate. Data Reviewed:    Labs: Basic Metabolic Panel: Recent Labs  Lab 08/21/21 1722 08/22/21 0203 08/22/21 0826 08/23/21 0429 08/24/21 0636  NA 136 137 138 136 137  K 3.9 4.4 3.9 3.8 4.1  CL 106 108 110 111 110  CO2 23 22 21* 21* 23  GLUCOSE 166* 189* 161* 130* 183*  BUN 18 21* 19 13 8   CREATININE 1.52* 1.68* 1.41* 1.25* 1.14*  CALCIUM 7.9* 8.3* 8.2* 8.0* 8.6*    GFR Estimated Creatinine Clearance: 66.6 mL/min (A) (by C-G formula based on SCr of 1.14 mg/dL (H)). Liver Function Tests: Recent Labs  Lab 08/22/21 0203 08/22/21 0826  08/23/21 0429 08/24/21 0636  AST 33 30 23 20   ALT 22 22 22 21   ALKPHOS 90 79 70 88  BILITOT 0.3 0.5 0.4 0.6  PROT 6.4* 6.0* 5.9* 6.2*  ALBUMIN 2.8* 2.6* 2.3* 2.4*    No results for input(s): LIPASE, AMYLASE in the last 168 hours. No results for input(s): AMMONIA in the last 168 hours. Coagulation profile Recent Labs  Lab 08/22/21 0826  INR 1.2    COVID-19 Labs  No results for input(s): DDIMER, FERRITIN, LDH, CRP in the last 72 hours.  Lab Results  Component Value Date   SARSCOV2NAA NEGATIVE 08/19/2021   SARSCOV2NAA POSITIVE (A) 09/16/2020   SARSCOV2NAA NEGATIVE 08/26/2020   [place] NEGATIVE 08/09/2020    CBC: Recent Labs  Lab 08/21/21 1722 08/22/21 0203 08/22/21 0826 08/23/21 0429 08/23/21 1507 08/24/21 0636 08/25/21 0622  WBC 13.3*   < > 15.6* 11.3* 11.7* 11.0* 10.4  NEUTROABS 9.0*  --  11.5*  --  7.4  --   --  HGB 8.7*   < > 8.4* 7.7* 8.7* 8.1* 8.1*  HCT 26.8*   < > 26.5* 24.3* 27.2* 25.5* 25.2*  MCV 89.0   < > 88.6 90.3 89.2 88.9 87.8  PLT 251   < > 247 231 277 286 331   < > = values in this interval not displayed.    Cardiac Enzymes: No results for input(s): CKTOTAL, CKMB, CKMBINDEX, TROPONINI in the last 168 hours. BNP (last 3 results) No results for input(s): PROBNP in the last 8760 hours. CBG: Recent Labs  Lab 08/23/21 2110 08/24/21 0801 08/24/21 1135 08/24/21 1708 08/24/21 2127  GLUCAP 143* 173* 147* 102* 133*    D-Dimer: No results for input(s): DDIMER in the last 72 hours. Hgb A1c: No results for input(s): HGBA1C in the last 72 hours.  Lipid Profile: No results for input(s): CHOL, HDL, LDLCALC, TRIG, CHOLHDL, LDLDIRECT in the last 72 hours. Thyroid function studies: No results for input(s): TSH, T4TOTAL, T3FREE, THYROIDAB in the last 72 hours.  Invalid input(s): FREET3 Anemia work up: No results for input(s): VITAMINB12, FOLATE, FERRITIN, TIBC, IRON, RETICCTPCT in the last 72 hours. Sepsis Labs: Recent Labs  Lab  08/22/21 0826 08/22/21 1024 08/23/21 0429 08/23/21 1507 08/24/21 0636 08/25/21 0622  PROCALCITON 1.53  --   --   --   --   --   WBC 15.6*  --  11.3* 11.7* 11.0* 10.4  LATICACIDVEN 0.9 1.0  --   --   --   --     Microbiology Recent Results (from the past 240 hour(s))  SARS CORONAVIRUS 2 (TAT 6-24 HRS) Nasopharyngeal Nasopharyngeal Swab     Status: None   Collection Time: 08/19/21 12:52 PM   Specimen: Nasopharyngeal Swab  Result Value Ref Range Status   SARS Coronavirus 2 NEGATIVE NEGATIVE Final    Comment: (NOTE) SARS-CoV-2 target nucleic acids are NOT DETECTED.  The SARS-CoV-2 RNA is generally detectable in upper and lower respiratory specimens during the acute phase of infection. Negative results do not preclude SARS-CoV-2 infection, do not rule out co-infections with other pathogens, and should not be used as the sole basis for treatment or other patient management decisions. Negative results must be combined with clinical observations, patient history, and epidemiological information. The expected result is Negative.  Fact Sheet for Patients: SugarRoll.be  Fact Sheet for Healthcare Providers: https://www.woods-mathews.com/  This test is not yet approved or cleared by the [place] FDA and  has been authorized for detection and/or diagnosis of SARS-CoV-2 by FDA under an Emergency Use Authorization (EUA). This EUA will remain  in effect (meaning this test can be used) for the duration of the COVID-19 declaration under Se ction 564(b)(1) of the Act, 21 U.S.C. section 360bbb-3(b)(1), unless the authorization is terminated or revoked sooner.  Performed at [facility], [place] [address] [place], [place] [address]   Surgical pcr screen     Status: None   Collection Time: 08/20/21  7:05 AM   Specimen: Nasal Mucosa; Nasal Swab  Result Value Ref Range Status   MRSA, PCR NEGATIVE NEGATIVE Final   Staphylococcus aureus  NEGATIVE NEGATIVE Final    Comment: (NOTE) The Xpert SA Assay (FDA approved for NASAL specimens in patients 43 years of age and older), is one component of a comprehensive surveillance program. It is not intended to diagnose infection nor to guide or monitor treatment. Performed at [facility], [place] [address] [place], [place] [address]   Culture, blood (x 2)  Status: None (Preliminary result)   Collection Time: 08/22/21  8:26 AM   Specimen: BLOOD  Result Value Ref Range Status   Specimen Description BLOOD LEFT ANTECUBITAL  Final   Special Requests   Final    BOTTLES DRAWN AEROBIC AND ANAEROBIC Blood Culture adequate volume   Culture   Final    NO GROWTH 2 DAYS Performed at [facility], [address], [place], [place] [address]    Report Status PENDING  Incomplete  Culture, blood (x 2)     Status: None (Preliminary result)   Collection Time: 08/22/21  8:41 AM   Specimen: BLOOD LEFT HAND  Result Value Ref Range Status   Specimen Description BLOOD LEFT HAND  Final   Special Requests   Final    BOTTLES DRAWN AEROBIC AND ANAEROBIC Blood Culture adequate volume   Culture   Final    NO GROWTH 2 DAYS Performed at [facility], [place] [address] [place], [place] [address]    Report Status PENDING  Incomplete     Medications:    aspirin EC  81 mg Oral Daily   atorvastatin  20 mg Oral Daily   buPROPion  150 mg Oral BID   docusate sodium  100 mg Oral BID   ferrous gluconate  324 mg Oral BID WC   gabapentin  600 mg Oral BID   insulin aspart  0-5 Units Subcutaneous QHS   insulin aspart  0-9 Units Subcutaneous TID WC   insulin aspart  2 Units Subcutaneous TID WC   insulin detemir  25 Units Subcutaneous BID   loratadine  10 mg Oral Daily   melatonin  3 mg Oral QHS   metoprolol tartrate  12.5 mg Oral BID   mometasone-formoterol  2 puff Inhalation BID   oxyCODONE  20 mg Oral Q12H   pantoprazole  40 mg Oral QHS   polyethylene glycol  17 g Oral BID    sertraline  100 mg Oral QHS   sodium chloride flush  3 mL Intravenous Q12H   sucralfate  1 g Oral QHS   topiramate  150 mg Oral BID   Continuous Infusions:  sodium chloride     sodium chloride 10 mL/hr at 08/23/21 0938   ceFEPime (MAXIPIME) IV 2 g (08/25/21 0118)   methocarbamol (ROBAXIN) IV Stopped (08/22/21 0534)   vancomycin 750 mg (08/24/21 2258)      LOS: 3 days   Charlynne Cousins  [facility]  08/25/2021, 7:17 AM

## 2021-08-25 NOTE — Progress Notes (Signed)
Subjective: 5 Days Post-Op Procedure(s) (LRB): RIGHT L4-5 TRANSFORAMINAL LUMBAR INTERBODY FUSION WITH RODS, SCREWS AND CAGE, LOCAL BONE GRAFT, ALLOGRAFT BONE GRAFT, VIVIGEN (N/A) Patient reports pain as severe.  States muscle spasms are really bad. Pain also worse with coughing.   Objective: Vital signs in last 24 hours: Temp:  [98.2 F (36.8 C)-99.6 F (37.6 C)] 98.8 F (37.1 C) (12/04 0836) Pulse Rate:  [93-107] 96 (12/04 0836) Resp:  [19-20] 20 (12/04 0836) BP: (133-160)/(67-87) 133/76 (12/04 0836) SpO2:  [91 %-100 %] 95 % (12/04 0836)  Intake/Output from previous day: 12/03 0701 - 12/04 0700 In: 1090 [P.O.:840; IV Piggyback:250] Out: 450 [Urine:450] Intake/Output this shift: No intake/output data recorded.  Recent Labs    08/23/21 0429 08/23/21 1507 08/24/21 0636 08/25/21 0622  HGB 7.7* 8.7* 8.1* 8.1*   Recent Labs    08/24/21 0636 08/25/21 0622  WBC 11.0* 10.4  RBC 2.87* 2.87*  HCT 25.5* 25.2*  PLT 286 331   Recent Labs    08/24/21 0636 08/25/21 0622  NA 137 136  K 4.1 3.8  CL 110 104  CO2 23 22  BUN 8 8  CREATININE 1.14* 1.10*  GLUCOSE 183* 141*  CALCIUM 8.6* 8.7*   No results for input(s): LABPT, INR in the last 72 hours.  Neurovascular intact Sensation intact distally Dorsiflexion/Plantar flexion intact Incision: dressing C/D/I   Assessment/Plan: 5 Days Post-Op Procedure(s) (LRB): RIGHT L4-5 TRANSFORAMINAL LUMBAR INTERBODY FUSION WITH RODS, SCREWS AND CAGE, LOCAL BONE GRAFT, ALLOGRAFT BONE GRAFT, VIVIGEN (N/A) Up with therapy Continue antibiotics. Afebrile WBC 10.4. Cultures NGTD.  Continue Robaxin, oxycodone for pain and muscle spasm. Dilaudid IV for breakthrough pain.  Hgb stabilizing    [place] 08/25/2021, 10:27 AM

## 2021-08-25 NOTE — PMR Pre-admission (Incomplete)
PMR Admission Coordinator Pre-Admission Assessment  Patient: [name] is an 50 y.o., female MRN: [id number] DOB: Jan 16, 1971 Height: 5\' 1"  (154.9 cm) Weight: 109 kg  Insurance Information HMO: ***    PPO: ***     PCP:      IPA:      80/20:      OTHER:  PRIMARY: [employer] Dual Complete     Policy#: [id number]      Subscriber: patient CM Name: ***      Phone#: ***     Fax#: *** Pre-Cert#: ***      Employer: *** Benefits:  Phone #: ***     Name: *** Irene Shipper. Date: ***     Deduct: ***      Out of Pocket Max: ***      Life Max: *** CIR: ***      SNF: *** Outpatient: ***     Co-Pay: *** Home Health: ***      Co-Pay: *** DME: ***     Co-Pay: *** Providers: in-network  SECONDARY: Medicaid [place] Access     Policy#: [id number]     Phone#: [phone]  Financial Counselor:       Phone#:   The Data Collection Information Summary for patients in Inpatient Rehabilitation Facilities with attached Privacy Act [place] Records was provided and verbally reviewed with: {CHL IP Patient Family KJ:[id number]}  Emergency Contact Information Contact Information     Name Relation Home Work Mobile   [name],Charles Spouse [phone]  [phone]   [place] Brother [phone]  [phone]   Hutchinson,Qutisha Daughter [phone]         Current Medical History  Patient Admitting Diagnosis: spondylolisthesis of lumbar region, s/p L4-5 transforaminal fusion History of Present Illness: ***    Patient's medical record from [facility] has been reviewed by the rehabilitation admission coordinator and physician.  Past Medical History  Past Medical History:  Diagnosis Date   Allergy    Anxiety    Arthritis    Asthma    Chronic back pain    Chronic chest pain    resolved, no problems since 2019 per patient 07/27/19   COPD (chronic obstructive pulmonary disease) ([place])    Depression    DM (diabetes mellitus) ([place])    INSULIN  DEPENDENT - TYPE 1   Dyspnea    GERD (gastroesophageal reflux disease)    Headache(784.0)    History of hiatal hernia    Hyperlipidemia    no med, diet controlled   Hypertension    Hypokalemia    Pneumonia    Respiratory disease 05/2017   uses inhalers, neb tx prn, no oxygen   Sleep apnea    does not use CPAP due to trach   Tracheostomy in place [facility]) 02/2017    Has the patient had major surgery during 100 days prior to admission? Yes  Family History   family history includes Arthritis in her mother; Asthma in her brother, brother, daughter, grandson, sister, sister, and son; Diabetes in her brother, mother, and sister; Heart attack in her mother; Hypertension in her mother and sister; Seizures in her brother; Stroke in her father and mother.  Current Medications  Current Facility-Administered Medications:    0.9 %  sodium chloride infusion, 250 mL, Intravenous, Continuous, Jessy Oto, MD   0.9 %  sodium chloride infusion, , Intravenous, Continuous, Charlynne Cousins, MD, Last Rate: 10 mL/hr at 08/23/21 0938, Rate Change at 08/23/21 0938   acetaminophen (TYLENOL) tablet 650 mg, 650  mg, Oral, Q4H PRN **OR** acetaminophen (TYLENOL) suppository 650 mg, 650 mg, Rectal, Q4H PRN, Jessy Oto, MD   albuterol (PROVENTIL) (2.5 MG/3ML) 0.083% nebulizer solution 2.5 mg, 2.5 mg, Nebulization, Q6H PRN, Jessy Oto, MD   ALPRAZolam Duanne Moron) tablet 0.25 mg, 0.25 mg, Oral, BID PRN, Jessy Oto, MD, 0.25 mg at 08/24/21 1017   alum & mag hydroxide-simeth (MAALOX/MYLANTA) 200-200-20 MG/5ML suspension 30 mL, 30 mL, Oral, Q6H PRN, Jessy Oto, MD   amoxicillin-clavulanate (AUGMENTIN) 875-125 MG per tablet 1 tablet, 1 tablet, Oral, Q12H, Charlynne Cousins, MD, 1 tablet at 08/25/21 1028   aspirin EC tablet 81 mg, 81 mg, Oral, Daily, Jessy Oto, MD, 81 mg at 08/25/21 0835   atorvastatin (LIPITOR) tablet 20 mg, 20 mg, Oral, Daily, Jessy Oto, MD, 20 mg at 08/25/21 5427    bisacodyl (DULCOLAX) EC tablet 5 mg, 5 mg, Oral, Daily PRN, Jessy Oto, MD   buPROPion [facility] SR) 12 hr tablet 150 mg, 150 mg, Oral, BID, Jessy Oto, MD, 150 mg at 08/25/21 0623   docusate sodium (COLACE) capsule 100 mg, 100 mg, Oral, BID, Jessy Oto, MD, 100 mg at 08/25/21 0837   ferrous gluconate (FERGON) tablet 324 mg, 324 mg, Oral, BID WC, Jessy Oto, MD, 324 mg at 08/25/21 7628   gabapentin (NEURONTIN) tablet 600 mg, 600 mg, Oral, BID, Charlynne Cousins, MD, 600 mg at 08/25/21 3151   HYDROmorphone (DILAUDID) injection 1 mg, 1 mg, Intravenous, Q2H PRN, Charlynne Cousins, MD, 1 mg at 08/25/21 1336   hydrOXYzine (ATARAX) tablet 25 mg, 25 mg, Oral, TID PRN, Jessy Oto, MD, 25 mg at 08/24/21 2252   insulin aspart (novoLOG) injection 0-5 Units, 0-5 Units, Subcutaneous, QHS, Charlynne Cousins, MD   insulin aspart (novoLOG) injection 0-9 Units, 0-9 Units, Subcutaneous, TID WC, Charlynne Cousins, MD, 2 Units at 08/25/21 1341   insulin aspart (novoLOG) injection 2 Units, 2 Units, Subcutaneous, TID WC, Charlynne Cousins, MD, 2 Units at 08/25/21 1342   insulin detemir (LEVEMIR) injection 25 Units, 25 Units, Subcutaneous, BID, Charlynne Cousins, MD, 25 Units at 08/25/21 0838   loratadine (CLARITIN) tablet 10 mg, 10 mg, Oral, Daily, Jessy Oto, MD, 10 mg at 08/25/21 7616   melatonin tablet 3 mg, 3 mg, Oral, QHS, Jessy Oto, MD, 3 mg at 08/24/21 2252   menthol-cetylpyridinium (CEPACOL) lozenge 3 mg, 1 lozenge, Oral, PRN **OR** phenol (CHLORASEPTIC) mouth spray 1 spray, 1 spray, Mouth/Throat, PRN, Jessy Oto, MD   methocarbamol (ROBAXIN) tablet 500 mg, 500 mg, Oral, Q6H PRN, 500 mg at 08/25/21 1654 **OR** methocarbamol (ROBAXIN) 500 mg in dextrose 5 % 50 mL IVPB, 500 mg, Intravenous, Q6H PRN, Jessy Oto, MD, Stopped at 08/22/21 0534   metoprolol tartrate (LOPRESSOR) tablet 12.5 mg, 12.5 mg, Oral, BID, Charlynne Cousins, MD, 12.5 mg at 08/25/21 0834    mometasone-formoterol (DULERA) 200-5 MCG/ACT inhaler 2 puff, 2 puff, Inhalation, BID, Jessy Oto, MD, 2 puff at 08/25/21 0753   ondansetron (ZOFRAN) tablet 4 mg, 4 mg, Oral, Q6H PRN **OR** ondansetron (ZOFRAN) injection 4 mg, 4 mg, Intravenous, Q6H PRN, Jessy Oto, MD   oxyCODONE (Oxy IR/ROXICODONE) immediate release tablet 10 mg, 10 mg, Oral, Q4H PRN, Jessy Oto, MD, 10 mg at 08/25/21 1654   oxyCODONE (OXYCONTIN) 12 hr tablet 20 mg, 20 mg, Oral, Q12H, Jessy Oto, MD, 20 mg at 08/25/21 1021   pantoprazole (PROTONIX) EC  tablet 40 mg, 40 mg, Oral, QHS, Jessy Oto, MD, 40 mg at 08/24/21 2252   polyethylene glycol (MIRALAX / GLYCOLAX) packet 17 g, 17 g, Oral, Daily PRN, Jessy Oto, MD   sertraline (ZOLOFT) tablet 100 mg, 100 mg, Oral, QHS, Jessy Oto, MD, 100 mg at 08/24/21 2251   sodium chloride flush (NS) 0.9 % injection 3 mL, 3 mL, Intravenous, Q12H, Jessy Oto, MD, 3 mL at 08/25/21 1022   sodium chloride flush (NS) 0.9 % injection 3 mL, 3 mL, Intravenous, PRN, Jessy Oto, MD   sodium phosphate (FLEET) 7-19 GM/118ML enema 1 enema, 1 enema, Rectal, Once PRN, Jessy Oto, MD   sucralfate (CARAFATE) tablet 1 g, 1 g, Oral, QHS, Jessy Oto, MD, 1 g at 08/24/21 2252   SUMAtriptan (IMITREX) tablet 50 mg, 50 mg, Oral, Q2H PRN, Jessy Oto, MD   tiZANidine (ZANAFLEX) tablet 4 mg, 4 mg, Oral, Q8H PRN, Jessy Oto, MD, 4 mg at 08/25/21 1028   topiramate (TOPAMAX) tablet 150 mg, 150 mg, Oral, BID, Jessy Oto, MD, 150 mg at 08/23/21 6378  Patients Current Diet:  Diet Order             Diet Carb Modified Fluid consistency: Thin; Room service appropriate? Yes  Diet effective now                   Precautions / Restrictions Precautions Precautions: Fall, Back Precaution Booklet Issued: No Precaution Comments: old trach; verbally reviewed back precautions, pt has a small orange carrot shape to occlude trach for speaking Spinal Brace: Lumbar corset,  Applied in sitting position Restrictions Weight Bearing Restrictions: No   Has the patient had 2 or more falls or a fall with injury in the past year? Yes  Prior Activity Level Community (5-7x/wk): gets out of house daily  Prior Functional Level Self Care: Did the patient need help bathing, dressing, using the toilet or eating? Needed some help  Indoor Mobility: Did the patient need assistance with walking from room to room (with or without device)? Independent  Stairs: Did the patient need assistance with internal or external stairs (with or without device)? Needed some help  Functional Cognition: Did the patient need help planning regular tasks such as shopping or remembering to take medications? Independent  Patient Information Are you of Hispanic, Latino/a,or Spanish origin?: A. No, not of Hispanic, Latino/a, or Spanish origin What is your race?: B. Black or African American Do you need or want an interpreter to communicate with a doctor or health care staff?: 0. No  Patient's Response To:  Health Literacy and Transportation Is the patient able to respond to health literacy and transportation needs?: Yes Health Literacy - How often do you need to have someone help you when you read instructions, pamphlets, or other written material from your doctor or pharmacy?: Never In the past 12 months, has lack of transportation kept you from medical appointments or from getting medications?: Yes In the past 12 months, has lack of transportation kept you from meetings, work, or from getting things needed for daily living?: Yes  Home Assistive Devices / [place] Devices/Equipment: Radio producer (specify quad or straight), CBG Meter Home Equipment: Cane - single point, Shower seat, Hand held shower head, Adaptive equipment  Prior Device Use: Indicate devices/aids used by the patient prior to current illness, exacerbation or injury?  cane  Current Functional Level Cognition  Overall  Cognitive Status: Difficult to assess  Difficult to assess due to: Tracheostomy Orientation Level: Oriented X4 General Comments: Following commands. maintaining conversation. Apears baseline. Feel possible decreased medical awareness.    Extremity Assessment (includes Sensation/Coordination)  Upper Extremity Assessment: Overall WFL for tasks assessed  Lower Extremity Assessment: Defer to PT evaluation    ADLs  Overall ADL's : Needs assistance/impaired Eating/Feeding: Set up, Sitting Grooming: Wash/dry hands, Min guard, Standing Grooming Details (indicate cue type and reason): Cues for upright posture. Upper Body Bathing: Moderate assistance Lower Body Bathing: Maximal assistance Upper Body Dressing : Minimal assistance, Sitting Upper Body Dressing Details (indicate cue type and reason): Donning brace with MIn A for positioning Lower Body Dressing: Maximal assistance Lower Body Dressing Details (indicate cue type and reason): Will need further education Toilet Transfer: Min guard, Regular Toilet, Rolling walker (2 wheels) (simulated) Toilet Transfer Details (indicate cue type and reason): Min Guard A for safety Toileting - Clothing Manipulation Details (indicate cue type and reason): Educating on compensatory tehcniques for peri care. Functional mobility during ADLs: Min guard, Rolling walker (2 wheels) General ADL Comments: Pt performing toilet and hand hygiene. Min Guard A for safety    Mobility  Overal bed mobility: Needs Assistance Bed Mobility: Rolling, Sidelying to Sit Rolling: Min assist Sidelying to sit: Min assist Sit to sidelying: Min assist, +2 for physical assistance General bed mobility comments: minA for trunk elevation into sitting    Transfers  Overall transfer level: Needs assistance Equipment used: Rolling walker (2 wheels) Transfers: Sit to/from Stand Sit to Stand: Min assist General transfer comment: minA to rise from low bed surface. Min guard to stand from  commode    Ambulation / Gait / Stairs / Emergency planning/management officer  Ambulation/Gait Ambulation/Gait assistance: Counsellor (Feet): 10 Feet (x10') Assistive device: Rolling walker (2 wheels) Gait Pattern/deviations: Step-to pattern, Decreased stride length, Wide base of support General Gait Details: slow gait with standing rest breaks due to pain Gait velocity: decreased    Posture / Balance Balance Overall balance assessment: Needs assistance Sitting-balance support: Bilateral upper extremity supported, Feet supported Sitting balance-[name]: Fair Standing balance support: Bilateral upper extremity supported, During functional activity, Single extremity supported, No upper extremity supported Standing balance-[name]: Poor Standing balance comment: reliant on UE support and external assist    Special needs/care consideration Continuous Drip IV  0.9% sodium chloride infusion, Trach size Shiley #4 uncuffed, Skin Surgical incision: back, and Diabetic management novoLOG 0-5 units daily at bedtime; novoLOG 2 units 3x daily with meals ; novoLOG 0-9 units 3x daily with meals; Levemir 25 units 2x daily   Previous Home Environment (from acute therapy documentation) Living Arrangements: Spouse/significant other  Lives With: Spouse, Daughter, Family (grandson) Available Help at Discharge: Family, Available 24 hours/day Type of Home: Apartment Home Layout: Two level Alternate Level Stairs-Rails: Right Alternate Level Stairs-Number of Steps: 13 Home Access: Level entry Entrance Stairs-Rails: Right Entrance Stairs-Number of Steps: 12 Bathroom Shower/Tub: Tub/shower unit, Industrial/product designer: Yes How Accessible: Accessible via walker Home Care Services: Yes Type of Home Care Services: Home RN, [place] (if known): [facility]  Discharge Living Setting Plans for Discharge Living Setting: Patient's home Type of Home  at Discharge: Apartment Discharge Home Layout: Two level Alternate Level Stairs-Rails: Right Alternate Level Stairs-Number of Steps: 13 Discharge Home Access: Level entry Discharge Bathroom Shower/Tub: Tub/shower unit Discharge Bathroom Toilet: Standard Discharge Bathroom Accessibility: Yes How Accessible: Accessible via walker Does the patient have any problems obtaining your medications?:  No  Social/Family/Support Systems Anticipated Caregiver: [name] (husband) and children Anticipated Caregiver's Contact Information: [phone] Caregiver Availability: 24/7 Discharge Plan Discussed with Primary Caregiver: Yes Is Caregiver In Agreement with Plan?: Yes Does Caregiver/Family have Issues with Lodging/Transportation while Pt is in Rehab?: No  Goals Patient/Family Goal for Rehab: *** Expected length of stay: *** Pt/Family Agrees to Admission and willing to participate: Yes Program Orientation Provided & Reviewed with Pt/Caregiver Including Roles  & Responsibilities: Yes  Decrease burden of Care through IP rehab admission: NA  Possible need for SNF placement upon discharge: Not anticipated  Patient Condition: {PATIENT'S CONDITION:22832}  Preadmission Screen Completed By:  Bethel Born, 08/25/2021 5:39 PM ______________________________________________________________________   Discussed status with Dr. Marland Kitchen on *** at *** and received approval for admission today.  Admission Coordinator:  Bethel Born, CCC-SLP, time ***/Date ***   Assessment/Plan: Diagnosis: Does the need for close, 24 hr/day Medical supervision in concert with the patient's rehab needs make it unreasonable for this patient to be served in a less intensive setting? {yes_no_potentially:3041433} Co-Morbidities requiring supervision/potential complications: *** Due to {due MH:9622297}, does the patient require 24 hr/day rehab nursing? {yes_no_potentially:3041433} Does the patient require  coordinated care of a physician, rehab nurse, PT, OT, and SLP to address physical and functional deficits in the context of the above medical diagnosis(es)? {yes_no_potentially:3041433} Addressing deficits in the following areas: {deficits:3041436} Can the patient actively participate in an intensive therapy program of at least 3 hrs of therapy 5 days a week? {yes_no_potentially:3041433} The potential for patient to make measurable gains while on inpatient rehab is {potential:3041437} Anticipated functional outcomes upon discharge from inpatient rehab: {functional outcomes:304600100} PT, {functional outcomes:304600100} OT, {functional outcomes:304600100} SLP Estimated rehab length of stay to reach the above functional goals is: *** Anticipated discharge destination: {anticipated dc setting:21604} 10. Overall Rehab/Functional Prognosis: {potential:3041437}   MD Signature: ***

## 2021-08-26 ENCOUNTER — Telehealth: Payer: Self-pay | Admitting: Clinical

## 2021-08-26 DIAGNOSIS — Z419 Encounter for procedure for purposes other than remedying health state, unspecified: Secondary | ICD-10-CM

## 2021-08-26 DIAGNOSIS — Z01818 Encounter for other preprocedural examination: Secondary | ICD-10-CM | POA: Diagnosis not present

## 2021-08-26 DIAGNOSIS — R6510 Systemic inflammatory response syndrome (SIRS) of non-infectious origin without acute organ dysfunction: Secondary | ICD-10-CM | POA: Diagnosis not present

## 2021-08-26 DIAGNOSIS — M4316 Spondylolisthesis, lumbar region: Secondary | ICD-10-CM | POA: Diagnosis not present

## 2021-08-26 LAB — CBC
HCT: 24.4 % — ABNORMAL LOW (ref 36.0–46.0)
Hemoglobin: 7.8 g/dL — ABNORMAL LOW (ref 12.0–15.0)
MCH: 28 pg (ref 26.0–34.0)
MCHC: 32 g/dL (ref 30.0–36.0)
MCV: 87.5 fL (ref 80.0–100.0)
Platelets: 369 10*3/uL (ref 150–400)
RBC: 2.79 MIL/uL — ABNORMAL LOW (ref 3.87–5.11)
RDW: 13.9 % (ref 11.5–15.5)
WBC: 9.9 10*3/uL (ref 4.0–10.5)
nRBC: 0.6 % — ABNORMAL HIGH (ref 0.0–0.2)

## 2021-08-26 LAB — CBC WITH DIFFERENTIAL/PLATELET
Abs Immature Granulocytes: 0.11 10*3/uL — ABNORMAL HIGH (ref 0.00–0.07)
Basophils Absolute: 0 10*3/uL (ref 0.0–0.1)
Basophils Relative: 0 %
Eosinophils Absolute: 0.2 10*3/uL (ref 0.0–0.5)
Eosinophils Relative: 1 %
HCT: 26.2 % — ABNORMAL LOW (ref 36.0–46.0)
Hemoglobin: 8.4 g/dL — ABNORMAL LOW (ref 12.0–15.0)
Immature Granulocytes: 1 %
Lymphocytes Relative: 24 %
Lymphs Abs: 2.6 10*3/uL (ref 0.7–4.0)
MCH: 28 pg (ref 26.0–34.0)
MCHC: 32.1 g/dL (ref 30.0–36.0)
MCV: 87.3 fL (ref 80.0–100.0)
Monocytes Absolute: 1 10*3/uL (ref 0.1–1.0)
Monocytes Relative: 9 %
Neutro Abs: 6.8 10*3/uL (ref 1.7–7.7)
Neutrophils Relative %: 65 %
Platelets: 401 10*3/uL — ABNORMAL HIGH (ref 150–400)
RBC: 3 MIL/uL — ABNORMAL LOW (ref 3.87–5.11)
RDW: 13.9 % (ref 11.5–15.5)
WBC: 10.6 10*3/uL — ABNORMAL HIGH (ref 4.0–10.5)
nRBC: 0.8 % — ABNORMAL HIGH (ref 0.0–0.2)

## 2021-08-26 LAB — COMPREHENSIVE METABOLIC PANEL
ALT: 21 U/L (ref 0–44)
AST: 15 U/L (ref 15–41)
Albumin: 2.3 g/dL — ABNORMAL LOW (ref 3.5–5.0)
Alkaline Phosphatase: 88 U/L (ref 38–126)
Anion gap: 6 (ref 5–15)
BUN: 10 mg/dL (ref 6–20)
CO2: 27 mmol/L (ref 22–32)
Calcium: 8.6 mg/dL — ABNORMAL LOW (ref 8.9–10.3)
Chloride: 104 mmol/L (ref 98–111)
Creatinine, Ser: 1.06 mg/dL — ABNORMAL HIGH (ref 0.44–1.00)
GFR, Estimated: >60 mL/min (ref >60)
Glucose, Bld: 151 mg/dL — ABNORMAL HIGH (ref 70–99)
Potassium: 3.7 mmol/L (ref 3.5–5.1)
Sodium: 137 mmol/L (ref 135–145)
Total Bilirubin: 0.5 mg/dL (ref 0.3–1.2)
Total Protein: 6 g/dL — ABNORMAL LOW (ref 6.5–8.1)

## 2021-08-26 LAB — RETICULOCYTES
Immature Retic Fract: 34.4 % — ABNORMAL HIGH (ref 2.3–15.9)
RBC.: 3.03 MIL/uL — ABNORMAL LOW (ref 3.87–5.11)
Retic Count, Absolute: 53.9 10*3/uL (ref 19.0–186.0)
Retic Ct Pct: 1.8 % (ref 0.4–3.1)

## 2021-08-26 LAB — GLUCOSE, CAPILLARY
Glucose-Capillary: 144 mg/dL — ABNORMAL HIGH (ref 70–99)
Glucose-Capillary: 174 mg/dL — ABNORMAL HIGH (ref 70–99)

## 2021-08-26 MED ORDER — AMOXICILLIN-POT CLAVULANATE 875-125 MG PO TABS: 1.0000 | ORAL_TABLET | Freq: Two times a day (BID) | ORAL | 1 refills | Status: DC

## 2021-08-26 MED ORDER — DOCUSATE SODIUM 100 MG PO CAPS
100.0000 mg | ORAL_CAPSULE | Freq: Two times a day (BID) | ORAL | 0 refills | Status: DC
Start: 2021-08-26 — End: 2022-07-09

## 2021-08-26 MED ORDER — POLYETHYLENE GLYCOL 3350 17 G PO PACK: 17.0000 g | PACK | Freq: Every day | ORAL | 0 refills | Status: DC | PRN

## 2021-08-26 MED ORDER — ASPIRIN 81 MG PO TBEC: 81.0000 mg | DELAYED_RELEASE_TABLET | Freq: Every day | ORAL | 11 refills | Status: DC

## 2021-08-26 MED ORDER — OXYCODONE HCL 10 MG PO TABS: 10.0000 mg | ORAL_TABLET | ORAL | 0 refills | Status: DC | PRN

## 2021-08-26 MED ORDER — XTAMPZA ER 18 MG PO C12A: 1.0000 | EXTENDED_RELEASE_CAPSULE | Freq: Two times a day (BID) | ORAL | 0 refills | Status: AC

## 2021-08-26 MED ORDER — GABAPENTIN 600 MG PO TABS: 600.0000 mg | ORAL_TABLET | Freq: Three times a day (TID) | ORAL | Status: DC

## 2021-08-26 NOTE — Telephone Encounter (Signed)
I called pt to see if she wanted to reschedule previous appt. Pt is currently in the hospital for surgery and will call back once she is out of the hospital.

## 2021-08-26 NOTE — Progress Notes (Signed)
Patient ID: [name], female   DOB: 1971/02/27, 50 y.o.   MRN: [id number] Follow up Hgb this afternoon is 8.4 with hct 26.2, this is stable and improving.  She is able to ambulate independently to bathroom. She is stable for discharge. Return to my office in one week for follow up.

## 2021-08-26 NOTE — Discharge Instructions (Signed)
    Call if there is increasing drainage, fever greater than 101.5, severe head aches, and worsening nausea or light sensitivity. If shortness of breath, bloody cough or chest tightness or pain go to an emergency room. No lifting greater than 10 lbs. Avoid bending, stooping and twisting. Use brace when sitting and out of bed even to go to bathroom. Walk in house for first 2 weeks then may start to get out slowly increasing distances up to one mile by 4-6 weeks post op. After 5 days may shower and change dressing following bathing with shower.When bathing remove the brace shower and replace brace before getting out of the shower. If drainage, keep dry dressing and do not bathe the incision, use an moisture impervious dressing. Please call and return for scheduled follow up appointment 2 weeks from the time of surgery. Take ferrous gluconate to build up blood volumn as you are anemic and slowly transition from sitting to standing and from lying to sitting to avoid feeling Weak or drowsy. Narcotics decrease blood pressure. Antihypertensives lower blood pressure.

## 2021-08-26 NOTE — Progress Notes (Signed)
Pt being discharged at this time. No currents needs from pt at this time regarding respiratory needs. Trach check deferred at this time due to discharge home.

## 2021-08-26 NOTE — Progress Notes (Signed)
Inpatient Rehab Admissions Coordinator:  Pt progressing well with therapies. Ambulating 150' with Min G assist. Pt agreeable to going home with [facility]. AC will sign off.   Gayland Curry, [place], [place] Admissions Coordinator [phone]

## 2021-08-26 NOTE — TOC Transition Note (Addendum)
Transition of Care (TOC) - CM/SW Discharge Note Brittney Gibbons RN,BSN Transitions of Care Unit 4NP (Non Trauma)- RN Case Manager See Treatment Team for direct Phone #    Patient Details  Name: [name] MRN: [id number] Date of Birth: 13-Mar-1971  Transition of Care [facility]) CM/SW Contact:  Brittney Patricia, RN Phone Number: 08/26/2021, 2:49 PM   Clinical Narrative:    Pt stable for transition home today, Per PT notes pt min guard and based on last CIR note would not need intensity of CIR admit- pt able to return home with [facility] services.  [facility] unable to accept referral- per weekend CM note referral has been accepted by [facility] for PT/OT/SLP- notified Amy with [facility] of discharge and confirmed services.  Pt also has PCS with [facility]. (Chronic Trach)  DME- RW has been delivered to room per [employer], pt has 3n1 at home.      Final next level of care: [place] Barriers to Discharge: Barriers Resolved   Patient Goals and CMS Choice Patient states their goals for this hospitalization and ongoing recovery are:: to go home CMS Medicare.gov Compare Post Acute Care list provided to:: Patient Choice offered to / list presented to : Patient  Discharge Placement               Home w/ [facility]        Discharge Plan and Services   Discharge Planning Services: CM Consult Post Acute Care Choice: Durable Medical Equipment, Home Health          DME Arranged: Walker rolling DME Agency: [employer] Date DME Agency Contacted: 08/21/21 Time DME Agency Contacted: 5465 Representative spoke with at DME Agency: Freda Munro HH Arranged: PT, OT, Speech Therapy HH Agency: [place] Date [place]: 08/26/21 Time [place]: 1448 Representative spoke with at [place]: [place] ([place]) Interventions     Readmission Risk Interventions Readmission Risk Prevention Plan 08/26/2021  Transportation  Screening Complete  PCP or Specialist Appt within 3-5 Days Complete  HRI or [place] Complete  Social Work Consult for [place] Planning/Counseling [place] Screening Not Applicable  Medication Review Press photographer) Complete  Some recent data might be hidden

## 2021-08-26 NOTE — Care Management Important Message (Signed)
Important Message  Patient Details  Name: [name] MRN: [id number] Date of Birth: 04-19-1971   Medicare Important Message Given:  Yes     Hannah Beat 08/26/2021, 12:19 PM

## 2021-08-26 NOTE — Progress Notes (Signed)
Physical Therapy Treatment Patient Details Name: [name] MRN: [id number] DOB: [birth year] Today's Date: 08/26/2021   History of Present Illness 50 y/o female admitted following R L4-5 transforaminal lumbar fusion on 11/29. PMH: HTN, COPD, depression, DM type 2, subglottic stenosis and tracheal stenosis s/p tracheostomy (current)    PT Comments    Pt demonstrates good mobility progress with therapy. She required min guard assist bed mobility, min guard assist transfers, and min guard assist ambulation 150' with RW. Pt in recliner at end of session.   Recommendations for follow up therapy are one component of a multi-disciplinary discharge planning process, led by the attending physician.  Recommendations may be updated based on patient status, additional functional criteria and insurance authorization.  Follow Up Recommendations  Acute inpatient rehab (3hours/day)     Assistance Recommended at Discharge Frequent or constant Supervision/Assistance  Equipment Recommendations  Rolling walker (2 wheels);BSC/3in1;Wheelchair cushion (measurements PT);Wheelchair (measurements PT)    Recommendations for Other Services       Precautions / Restrictions Precautions Precautions: Fall;Back Precaution Comments: reviewed 3/3 back precautions Required Braces or Orthoses: Spinal Brace Spinal Brace: Lumbar corset;Applied in sitting position     Mobility  Bed Mobility Overal bed mobility: Needs Assistance Bed Mobility: Rolling;Sidelying to Sit Rolling: Modified independent (Device/Increase time) Sidelying to sit: Min guard;HOB elevated       General bed mobility comments: pt demo good technique with logroll    Transfers Overall transfer level: Needs assistance Equipment used: Rolling walker (2 wheels) Transfers: Sit to/from Stand Sit to Stand: Min guard           General transfer comment: assist for safety, increased time, cues for sequencing     Ambulation/Gait Ambulation/Gait assistance: Min guard Gait Distance (Feet): 150 Feet Assistive device: Rolling walker (2 wheels) Gait Pattern/deviations: Step-through pattern;Decreased stride length Gait velocity: decreased Gait velocity interpretation: <1.31 ft/sec, indicative of household ambulator   General Gait Details: slow gait with standing rest breaks due to pain   Stairs             Wheelchair Mobility    Modified Rankin (Stroke Patients Only)       Balance Overall balance assessment: Needs assistance Sitting-balance support: Feet supported Sitting balance-[name]: Good     Standing balance support: Bilateral upper extremity supported;During functional activity;Single extremity supported Standing balance-[name]: Poor Standing balance comment: reliant on external support                            Cognition Arousal/Alertness: Awake/alert Behavior During Therapy: WFL for tasks assessed/performed Overall Cognitive Status: Within Functional Limits for tasks assessed                                          Exercises      General Comments General comments (skin integrity, edema, etc.): VSS on RA, old trach      Pertinent Vitals/Pain Pain Assessment: Faces Faces Pain Scale: Hurts even more Pain Intervention(s): Repositioned;Monitored during session;RN gave pain meds during session    Home Living                          Prior Function            PT Goals (current goals can now be found in the care plan section) Acute  Rehab PT Goals Patient Stated Goal: get better Progress towards PT goals: Progressing toward goals    Frequency    Min 5X/week      PT Plan Current plan remains appropriate    Co-evaluation              AM-PAC PT "6 Clicks" Mobility   Outcome Measure  Help needed turning from your back to your side while in a flat bed without using bedrails?: A Little Help  needed moving from lying on your back to sitting on the side of a flat bed without using bedrails?: A Little Help needed moving to and from a bed to a chair (including a wheelchair)?: A Little Help needed standing up from a chair using your arms (e.g., wheelchair or bedside chair)?: A Little Help needed to walk in hospital room?: A Little Help needed climbing 3-5 steps with a railing? : A Lot 6 Click Score: 17    End of Session Equipment Utilized During Treatment: Gait belt;Back brace Activity Tolerance: Patient tolerated treatment well Patient left: in chair;with call bell/phone within reach Nurse Communication: Mobility status PT Visit Diagnosis: Unsteadiness on feet (R26.81);Muscle weakness (generalized) (M62.81);Difficulty in walking, not elsewhere classified (R26.2)     Time: 3545-6256 PT Time Calculation (min) (ACUTE ONLY): 29 min  Charges:  $Gait Training: 23-37 mins                     Lorrin Goodell, [place]  Office # [phone] Pager [phone]    Lorriane Shire 08/26/2021, 12:47 PM

## 2021-08-26 NOTE — Progress Notes (Signed)
[facility] CONSULT PROGRESS NOTE    Progress Note  [name]  ZCH:[id number] DOB: 01-18-1971 DOA: 08/20/2021 PCP: [name], MD     Brief Narrative:   [name] is an 50 y.o. female past medical history significant for diabetes mellitus type 2, essential hypertension, obstructive sleep apnea depression status post tracheostomy secondary to previous intubation related tracheal injury leading to subglottic stenosis on 2 L nasal cannula at night admitted by orthopedic surgery for intractable back pain and right lower extremity radiculopathy underwent right L4-L5 transforaminal lumbar interbody fusion with rods and screws and cage with local bone graft on 08/20/2021, she tolerated the procedure well postoperatively developed hypotension thought to be secondary to hypovolemia in nature in the setting of narcotic induced ongoing uncontrolled pain.   Assessment/Plan:   SIRS: Remains afebrile leukocytosis is resolved.  Unclear source Culture data has remained negative. Continue Augmentin for total of 7 days.  Stop date is 08/28/2021. Will sign off  Acute kidney injury on chronic kidney disease stage III: With a baseline creatinine 1.1-1.2. Multifactorial in the setting of NSAIDs  and SIRS reaction.  Hyponatremia possibly hypovolemic: slight resolved with IV fluid hydration.  Diabetes mellitus type 2: She relates she does not have an appetite, A1c was 7.4. Blood glucose control is improved continue long-acting insulin plus sliding scale.   Obesity hypoventilation syndrome/obstructive sleep apnea/chronic tracheostomy related to subglottic stenosis: Continue inhalers and supplemental oxygen at night.  Intractable back pain and right lower extremity radiculopathy present on admission: Status post lumbar fusion 08/20/2021. Defer further management per orthopedic surgery. She relates her pain is not controlled, continue narcotics increase  IV hydromorphone. Avoid NSAIDs.  Continue Robaxin and narcotics. Question of a degree of her pain is psychological.  Anxiety/depression: Continue Wellbutrin Zoloft and Topamax.  DVT prophylaxis: lovenox Family Communication:none Status is: Observation  The patient will require care spanning > 2 midnights and should be moved to inpatient because: Significant hypotension with tachycardia worsening leukocytosis concern about sepsis etiology       Code Status:     Code Status Orders  (From admission, onward)           Start     Ordered   08/20/21 1527  Full code  Continuous        08/20/21 1527           Code Status History     Date Active Date Inactive Code Status Order ID Comments User Context   09/16/2020 2210 09/22/2020 0048 Full Code [id number]  Daisy Floro, DO ED   08/27/2020 0030 08/29/2020 2101 Full Code [id number]  Wilber Oliphant, MD ED   08/26/2020 2302 08/27/2020 0029 Full Code [id number]  Ezequiel Essex, MD ED   07/28/2019 1733 07/29/2019 1921 Full Code [id number]  Donella Stade, PA-C Inpatient   03/22/2019 0903 03/26/2019 2049 Full Code [id number]  Karmen Bongo, MD Inpatient   10/24/2018 0013 10/29/2018 1918 Full Code [id number]  [name], MD Inpatient   03/05/2018 2110 03/12/2018 0058 Full Code [id number]  Jule Ser, DO ED   06/02/2017 1608 06/09/2017 1320 Full Code [id number]  Radene Gunning, NP ED   05/06/2017 2352 05/08/2017 1717 Full Code [id number]  Edwin Dada, MD ED   02/27/2017 1448 03/04/2017 1345 Full Code [id number]  Bary Leriche, PA-C Inpatient   02/19/2017 0046 02/27/2017 1426 Full Code [id number]  Carlyle Dolly, MD ED   01/30/2015 1923 01/31/2015 0555 Full Code [id number]  Phillips Grout, MD  Inpatient   06/04/2014 1715 06/06/2014 1949 Full Code [id number]  Otho Bellows, MD Inpatient   02/26/2014 2209 03/04/2014 1612 Full Code [id number]  Delfina Redwood, MD Inpatient   04/27/2013 1418 05/01/2013 1530 Full Code [id number]  Louellen Molder, MD Inpatient   08/31/2011 1339 09/01/2011 0030 Full Code [id number]  Lawyer, Resa Miner, PA-C ED         IV Access:   Peripheral IV   Procedures and diagnostic studies:   No results found.   Medical Consultants:   None.   Subjective:    [name] complaining of pain  Objective:    Vitals:   08/25/21 2301 08/25/21 2325 08/26/21 0258 08/26/21 0404  BP:  123/65  (!) 145/88  Pulse: 88 89 88 90  Resp: 18 18 18 18   Temp:  98 F (36.7 C)  98.9 F (37.2 C)  TempSrc:  Axillary  Oral  SpO2: 95% 95% 95% 98%  Weight:      Height:       SpO2: 98 % O2 Flow Rate (L/min): 5 L/min FiO2 (%): 28 %  No intake or output data in the 24 hours ending 08/26/21 0700  Filed Weights   08/20/21 0623  Weight: 109 kg    Exam: General exam: In no acute distress. Respiratory system: Good air movement and clear to auscultation. Cardiovascular system: S1 & S2 heard, RRR. No JVD. Gastrointestinal system: Abdomen is nondistended, soft and nontender.  Extremities: No pedal edema. Skin: No rashes, lesions or ulcers  Data Reviewed:    Labs: Basic Metabolic Panel: Recent Labs  Lab 08/22/21 0826 08/23/21 0429 08/24/21 0636 08/25/21 0622 08/26/21 0346  NA 138 136 137 136 137  K 3.9 3.8 4.1 3.8 3.7  CL 110 111 110 104 104  CO2 21* 21* 23 22 27   GLUCOSE 161* 130* 183* 141* 151*  BUN 19 13 8 8 10   CREATININE 1.41* 1.25* 1.14* 1.10* 1.06*  CALCIUM 8.2* 8.0* 8.6* 8.7* 8.6*    GFR Estimated Creatinine Clearance: 71.7 mL/min (A) (by C-G formula based on SCr of 1.06 mg/dL (H)). Liver Function Tests: Recent Labs  Lab 08/22/21 0826 08/23/21 0429 08/24/21 0636 08/25/21 0622 08/26/21 0346  AST 30 23 20 19 15   ALT 22 22 21 23 21   ALKPHOS 79 70 88 84 88  BILITOT 0.5 0.4 0.6 0.6 0.5  PROT 6.0* 5.9* 6.2* 6.2* 6.0*  ALBUMIN 2.6* 2.3* 2.4* 2.4* 2.3*    No results for input(s): LIPASE, AMYLASE in the last 168 hours. No results for input(s):  AMMONIA in the last 168 hours. Coagulation profile Recent Labs  Lab 08/22/21 0826  INR 1.2    COVID-19 Labs  No results for input(s): DDIMER, FERRITIN, LDH, CRP in the last 72 hours.  Lab Results  Component Value Date   SARSCOV2NAA NEGATIVE 08/19/2021   SARSCOV2NAA POSITIVE (A) 09/16/2020   SARSCOV2NAA NEGATIVE 08/26/2020   [place] NEGATIVE 08/09/2020    CBC: Recent Labs  Lab 08/21/21 1722 08/22/21 0203 08/22/21 0826 08/23/21 0429 08/23/21 1507 08/24/21 0636 08/25/21 0622 08/26/21 0346  WBC 13.3*   < > 15.6* 11.3* 11.7* 11.0* 10.4 9.9  NEUTROABS 9.0*  --  11.5*  --  7.4  --   --   --   HGB 8.7*   < > 8.4* 7.7* 8.7* 8.1* 8.1* 7.8*  HCT 26.8*   < > 26.5* 24.3* 27.2* 25.5* 25.2* 24.4*  MCV 89.0   < > 88.6 90.3 89.2 88.9 87.8 87.5  PLT 251   < > 247 231 277 286 331 369   < > = values in this interval not displayed.    Cardiac Enzymes: No results for input(s): CKTOTAL, CKMB, CKMBINDEX, TROPONINI in the last 168 hours. BNP (last 3 results) No results for input(s): PROBNP in the last 8760 hours. CBG: Recent Labs  Lab 08/25/21 0821 08/25/21 1201 08/25/21 1246 08/25/21 1652 08/25/21 2139  GLUCAP 177* 132* 153* 85 136*    D-Dimer: No results for input(s): DDIMER in the last 72 hours. Hgb A1c: No results for input(s): HGBA1C in the last 72 hours.  Lipid Profile: No results for input(s): CHOL, HDL, LDLCALC, TRIG, CHOLHDL, LDLDIRECT in the last 72 hours. Thyroid function studies: No results for input(s): TSH, T4TOTAL, T3FREE, THYROIDAB in the last 72 hours.  Invalid input(s): FREET3 Anemia work up: No results for input(s): VITAMINB12, FOLATE, FERRITIN, TIBC, IRON, RETICCTPCT in the last 72 hours. Sepsis Labs: Recent Labs  Lab 08/22/21 0826 08/22/21 1024 08/23/21 0429 08/23/21 1507 08/24/21 0636 08/25/21 0622 08/26/21 0346  PROCALCITON 1.53  --   --   --   --   --   --   WBC 15.6*  --    < > 11.7* 11.0* 10.4 9.9  LATICACIDVEN 0.9 1.0  --   --   --    --   --    < > = values in this interval not displayed.    Microbiology Recent Results (from the past 240 hour(s))  SARS CORONAVIRUS 2 (TAT 6-24 HRS) Nasopharyngeal Nasopharyngeal Swab     Status: None   Collection Time: 08/19/21 12:52 PM   Specimen: Nasopharyngeal Swab  Result Value Ref Range Status   SARS Coronavirus 2 NEGATIVE NEGATIVE Final    Comment: (NOTE) SARS-CoV-2 target nucleic acids are NOT DETECTED.  The SARS-CoV-2 RNA is generally detectable in upper and lower respiratory specimens during the acute phase of infection. Negative results do not preclude SARS-CoV-2 infection, do not rule out co-infections with other pathogens, and should not be used as the sole basis for treatment or other patient management decisions. Negative results must be combined with clinical observations, patient history, and epidemiological information. The expected result is Negative.  Fact Sheet for Patients: SugarRoll.be  Fact Sheet for Healthcare Providers: https://www.woods-mathews.com/  This test is not yet approved or cleared by the [place] FDA and  has been authorized for detection and/or diagnosis of SARS-CoV-2 by FDA under an Emergency Use Authorization (EUA). This EUA will remain  in effect (meaning this test can be used) for the duration of the COVID-19 declaration under Se ction 564(b)(1) of the Act, 21 U.S.C. section 360bbb-3(b)(1), unless the authorization is terminated or revoked sooner.  Performed at [facility], [place] [address], [place], [place] [address]   Surgical pcr screen     Status: None   Collection Time: 08/20/21  7:05 AM   Specimen: Nasal Mucosa; Nasal Swab  Result Value Ref Range Status   MRSA, PCR NEGATIVE NEGATIVE Final   Staphylococcus aureus NEGATIVE NEGATIVE Final    Comment: (NOTE) The Xpert SA Assay (FDA approved for NASAL specimens in patients 54 years of age and older), is one component of a  comprehensive surveillance program. It is not intended to diagnose infection nor to guide or monitor treatment. Performed at [facility], [place] [address], [place], [place] [address]   Culture, blood (x 2)     Status: None (Preliminary result)   Collection Time: 08/22/21  8:26 AM   Specimen: BLOOD  Result Value Ref Range Status   Specimen Description BLOOD LEFT ANTECUBITAL  Final   Special Requests   Final    BOTTLES DRAWN AEROBIC AND ANAEROBIC Blood Culture adequate volume   Culture   Final    NO GROWTH 3 DAYS Performed at [facility], [address], [place], [place] [address]    Report Status PENDING  Incomplete  Culture, blood (x 2)     Status: None (Preliminary result)   Collection Time: 08/22/21  8:41 AM   Specimen: BLOOD LEFT HAND  Result Value Ref Range Status   Specimen Description BLOOD LEFT HAND  Final   Special Requests   Final    BOTTLES DRAWN AEROBIC AND ANAEROBIC Blood Culture adequate volume   Culture   Final    NO GROWTH 3 DAYS Performed at [facility], [place] [address], [place], [place] [address]    Report Status PENDING  Incomplete     Medications:    amoxicillin-clavulanate  1 tablet Oral Q12H   aspirin EC  81 mg Oral Daily   atorvastatin  20 mg Oral Daily   buPROPion  150 mg Oral BID   docusate sodium  100 mg Oral BID   ferrous gluconate  324 mg Oral BID WC   gabapentin  600 mg Oral BID   insulin aspart  0-5 Units Subcutaneous QHS   insulin aspart  0-9 Units Subcutaneous TID WC   insulin aspart  2 Units Subcutaneous TID WC   insulin detemir  25 Units Subcutaneous BID   loratadine  10 mg Oral Daily   melatonin  3 mg Oral QHS   metoprolol tartrate  12.5 mg Oral BID   mometasone-formoterol  2 puff Inhalation BID   oxyCODONE  20 mg Oral Q12H   pantoprazole  40 mg Oral QHS   sertraline  100 mg Oral QHS   sodium chloride flush  3 mL Intravenous Q12H   sucralfate  1 g Oral QHS   topiramate  150 mg Oral BID   Continuous  Infusions:  sodium chloride     sodium chloride 10 mL/hr at 08/23/21 0938   methocarbamol (ROBAXIN) IV Stopped (08/22/21 0534)      LOS: 4 days   Charlynne Cousins  [facility]  08/26/2021, 7:00 AM

## 2021-08-26 NOTE — Progress Notes (Signed)
     Subjective: 6 Days Post-Op Procedure(s) (LRB): RIGHT L4-5 TRANSFORAMINAL LUMBAR INTERBODY FUSION WITH RODS, SCREWS AND CAGE, LOCAL BONE GRAFT, ALLOGRAFT BONE GRAFT, VIVIGEN (N/A) Complains of pain, on Oxycontin 20 BID and oxyIR 10 mg q 4-6 hours, more than enough for pain control. Complains of back spasm, now with methocarbomal, tizanidine and xanax, adequate by most measures. Feels weak arising from lying down to sitting and standing. Hgb 7.8 today after 3 days in the 8.1 to 8.7 range. On ferrous gluconate. It is possible vancomycin may cause suppression of hemogenesis, may need to check reticulocyte count to assess.  Patient reports pain as moderate.    Objective:   VITALS:  Temp:  [98 F (36.7 C)-99.7 F (37.6 C)] 99.7 F (37.6 C) (12/05 0727) Pulse Rate:  [80-96] 80 (12/05 0727) Resp:  [18-20] 19 (12/05 0727) BP: (113-145)/(61-89) 126/80 (12/05 0727) SpO2:  [95 %-98 %] 96 % (12/05 0727)  Neurologically intact ABD soft Neurovascular intact Sensation intact distally Intact pulses distally Dorsiflexion/Plantar flexion intact Incision: dressing C/D/I, no drainage, and scant drainage   LABS Recent Labs    08/23/21 1507 08/24/21 0636 08/25/21 0622 08/26/21 0346  HGB 8.7* 8.1* 8.1* 7.8*  WBC 11.7* 11.0* 10.4 9.9  PLT 277 286 331 369   Recent Labs    08/25/21 0622 08/26/21 0346  NA 136 137  K 3.8 3.7  CL 104 104  CO2 22 27  BUN 8 10  CREATININE 1.10* 1.06*  GLUCOSE 141* 151*   No results for input(s): LABPT, INR in the last 72 hours.   Assessment/Plan: 6 Days Post-Op Procedure(s) (LRB): RIGHT L4-5 TRANSFORAMINAL LUMBAR INTERBODY FUSION WITH RODS, SCREWS AND CAGE, LOCAL BONE GRAFT, ALLOGRAFT BONE GRAFT, VIVIGEN (N/A)  Advance diet Up with therapy Check orthostatic vital signs Repeat CBC at noon. ? Reticulocyte count.    [name] 08/26/2021, 8:05 AM Patient ID: [name], female   DOB: [birth year], 50 y.o.   MRN: [id number]

## 2021-08-27 LAB — CULTURE, BLOOD (ROUTINE X 2)
Culture: NO GROWTH
Culture: NO GROWTH
Special Requests: ADEQUATE
Special Requests: ADEQUATE

## 2021-08-29 ENCOUNTER — Other Ambulatory Visit: Payer: Self-pay | Admitting: Specialist

## 2021-08-29 ENCOUNTER — Encounter (HOSPITAL_COMMUNITY): Payer: Self-pay | Admitting: Specialist

## 2021-08-29 ENCOUNTER — Telehealth: Payer: Self-pay | Admitting: Specialist

## 2021-08-29 NOTE — Telephone Encounter (Signed)
Pt called stating Dr.Nitka was supposed to call her rx in but her pharmacy still doesn't have anything. Pt would like a CB when something has been sent in please.   [phone]

## 2021-08-30 ENCOUNTER — Telehealth: Payer: Self-pay | Admitting: Specialist

## 2021-08-30 NOTE — Telephone Encounter (Signed)
I called and spoke with pt and she states that this was taken care of.

## 2021-08-30 NOTE — Telephone Encounter (Signed)
Barnett Applebaum (Speech therapist) called from [facility]. Barnett Applebaum states pt refused eval of home health and Barnett Applebaum stated pt stated to follow up with [facility]. Her issue is ongoing. No needed call back. [place] phone number is [phone].

## 2021-08-31 ENCOUNTER — Encounter: Payer: Self-pay | Admitting: Family Medicine

## 2021-09-02 ENCOUNTER — Other Ambulatory Visit: Payer: Self-pay | Admitting: Family Medicine

## 2021-09-02 MED ORDER — MISC. DEVICES MISC: 0 refills | Status: DC

## 2021-09-05 ENCOUNTER — Ambulatory Visit (INDEPENDENT_AMBULATORY_CARE_PROVIDER_SITE_OTHER): Payer: 59 | Admitting: Specialist

## 2021-09-05 ENCOUNTER — Ambulatory Visit: Payer: Self-pay

## 2021-09-05 ENCOUNTER — Other Ambulatory Visit: Payer: Self-pay

## 2021-09-05 ENCOUNTER — Encounter: Payer: Self-pay | Admitting: Specialist

## 2021-09-05 VITALS — BP 114/78 | HR 101 | Ht 61.0 in | Wt 240.0 lb

## 2021-09-05 DIAGNOSIS — Z981 Arthrodesis status: Secondary | ICD-10-CM

## 2021-09-05 MED ORDER — OXYCODONE HCL 10 MG PO TABS: 10.0000 mg | ORAL_TABLET | ORAL | 0 refills | Status: DC | PRN

## 2021-09-05 MED ORDER — OXYCODONE ER 18 MG PO C12A
1.0000 | EXTENDED_RELEASE_CAPSULE | Freq: Two times a day (BID) | ORAL | 0 refills | Status: DC
Start: 2021-09-05 — End: 2021-09-12

## 2021-09-05 NOTE — Progress Notes (Signed)
Post-Op Visit Note   Patient: [name]           Date of Birth: 02-03-1971           MRN: [id number] Visit Date: 09/05/2021 PCP: [name], MD   Assessment & Plan: 2weeks post op TLIF right L4-5,  Complains of pain and numbness and tingling both legs, left leg with shooting pain Reflexes knee 0/0, ankle 0/0 SLR is negative. Motor intact.  Radiographs with no acute changes, hardware good position and alignment. Chief Complaint:  Chief Complaint  Patient presents with   Lower Back - Routine Post Op    2 weeks post Right L4-5 TLIF,    Visit Diagnoses:  1. S/P lumbar fusion     Plan: Avoid frequent bending and stooping  No lifting greater than 10 lbs. May use ice or moist heat for pain. Weight loss is of benefit. Gradually increase walking daily to 1/4-1/2 mile by 6 weeks post op.  Exercise is important to improve your indurance and does allow people to function better inspite of back pain. Wear brace when out of bed.   Follow-Up Instructions: Return in about 4 weeks (around 10/03/2021).   Orders:  Orders Placed This Encounter  Procedures   XR Lumbar Spine 2-3 Views   No orders of the defined types were placed in this encounter.   Imaging: No results found.  PMFS History: Patient Active Problem List   Diagnosis Date Noted   Spondylolisthesis, lumbar region 08/20/2021    Priority: High    Class: Chronic   Fusion of spine of lumbar region 08/20/2021   Chronic respiratory failure with hypoxia ([place]) 06/20/2021   Preop pulmonary/respiratory exam 06/20/2021   Abdominal pain, epigastric 11/28/2020   Nausea and vomiting 11/28/2020   COVID-19 09/16/2020   Dyspnea 08/26/2020   Rotator cuff tear 07/28/2019   Insulin dependent type 2 diabetes mellitus ([place]) 04/12/2019   Chest pain of uncertain etiology 26/94/8546   Lactic acidosis 03/22/2019   Chronic right shoulder pain    Community acquired pneumonia 10/25/2018   Asthma, chronic,  unspecified asthma severity, with acute exacerbation 10/25/2018   Acute respiratory failure with hypoxia and hypercapnia ([place]) 10/23/2018   Acute pain of right shoulder due to trauma 10/05/2018   Chronic bilateral low back pain without sciatica 10/05/2018   Dysphonia 10/05/2018   Severe asthma with exacerbation 10/05/2018   Rotator cuff arthropathy, right 06/15/2018   AKI (acute kidney injury) ([place]) 03/06/2018   Carpal tunnel syndrome 01/18/2018   Heart failure ([place]) 05/20/2017   Normocytic anemia 05/06/2017   Tracheostomy dependent ([place]) 03/26/2017   Dysphagia 03/26/2017   Status post tracheostomy ([place]) 03/14/2017   Klebsiella cystitis 03/14/2017   Morbid obesity ([place]) 03/13/2017   Subglottic stenosis 03/06/2017   Asthma 03/06/2017   Chronic diastolic heart failure ([place]) 03/06/2017   Tracheal stenosis 03/04/2017   Acute blood loss anemia    Generalized anxiety disorder    Chronic pain syndrome    GERD (gastroesophageal reflux disease) 02/22/2016   Depression 02/22/2016   Migraine 01/30/2015   Bulging lumbar disc 09/05/2013   Essential hypertension 09/05/2013   Allergic rhinitis, seasonal 08/11/2012   Chronic cough 08/11/2012   OSA (obstructive sleep apnea) 12/04/2011   Past Medical History:  Diagnosis Date   Allergy    Anxiety    Arthritis    Asthma    Chronic back pain    Chronic chest pain    resolved, no problems since 2019 per patient 07/27/19  COPD (chronic obstructive pulmonary disease) (H[facility])    Depression    DM (diabetes mellitus) ([facility])    INSULIN DEPENDENT - TYPE 1   Dyspnea    GERD (gastroesophageal reflux disease)    Headache(784.0)    History of hiatal hernia    Hyperlipidemia    no med, diet controlled   Hypertension    Hypokalemia    Pneumonia    Respiratory disease 05/2017   uses inhalers, neb tx prn, no oxygen   Sleep apnea    does not use CPAP due to trach   Tracheostomy in place [facility]) 02/2017    Family History  Problem Relation Age of  Onset   Heart attack Mother    Stroke Mother    Diabetes Mother    Hypertension Mother    Arthritis Mother    Stroke Father    Asthma Sister    Hypertension Sister    Asthma Sister    Diabetes Sister    Asthma Brother    Seizures Brother    Asthma Brother    Diabetes Brother    Asthma Daughter    Asthma Son    Asthma Grandson    Colon cancer Neg Hx    Rectal cancer Neg Hx    Stomach cancer Neg Hx     Past Surgical History:  Procedure Laterality Date   ABDOMINAL HYSTERECTOMY  2009   APPENDECTOMY     CESAREAN SECTION     x 3   CHOLECYSTECTOMY N/A 03/02/2014   Procedure: LAPAROSCOPIC CHOLECYSTECTOMY;  Surgeon: Joyice Faster. Cornett, MD;  Location: [place];  Service: General;  Laterality: N/A;   HERNIA REPAIR     PANENDOSCOPY N/A 03/04/2017   Procedure: PANENDOSCOPY WITH POSSIBLE FOREIGN BODY REMOVAL;  Surgeon: Jodi Marble, MD;  Location: [place];  Service: ENT;  Laterality: N/A;   ROTATOR CUFF REPAIR Left    ROTATOR CUFF REPAIR Right 07/27/2019   SHOULDER ARTHROSCOPY WITH SUBACROMIAL DECOMPRESSION, ROTATOR CUFF REPAIR AND BICEP TENDON REPAIR Right 07/28/2019   Procedure: RIGHT SHOULDER ARTHROSCOP, MINI OPEN ROTATOR CUFF TEAR REPAIR,  BICEPS TENODESIS, DISTAL CLAVICLE EXCISION;  Surgeon: Meredith Pel, MD;  Location: [place];  Service: Orthopedics;  Laterality: Right;   TOOTH EXTRACTION N/A 02/22/2021   Procedure: DENTAL RESTORATION/EXTRACTIONS;  Surgeon: Diona Browner, DMD;  Location: [facility];  Service: Oral Surgery;  Laterality: N/A;   TRACHEOSTOMY  02/2017   TUBAL LIGATION     VESICOVAGINAL FISTULA CLOSURE W/ TAH  2009   Social History   Occupational History   Not on file  Tobacco Use   Smoking status: Former    Packs/day: 0.50    Years: 25.00    Pack years: 12.50    Types: Cigarettes    Quit date: 01/21/2016    Years since quitting: 5.6   Smokeless tobacco: Never  Vaping Use   Vaping Use: Never used  Substance and Sexual Activity   Alcohol use: Yes    Alcohol/week:  0.0 standard drinks    Comment: social wine   Drug use: Yes    Types: Marijuana    Comment: couple times a month   Sexual activity: Yes    Partners: Male    Birth control/protection: Surgical    Comment: Hysterectomy

## 2021-09-06 ENCOUNTER — Encounter: Payer: 59 | Admitting: Specialist

## 2021-09-09 ENCOUNTER — Telehealth: Payer: Self-pay

## 2021-09-09 NOTE — Telephone Encounter (Signed)
Transition Care Management Follow-up Telephone Call Date of discharge and from where: 08/26/2021   How have you been since you were released from the hospital? Doing Well Any questions or concerns? No  Items Reviewed: Did the pt receive and understand the discharge instructions provided? Yes  Medications obtained and verified? Yes  Other? No  Any new allergies since your discharge? No  Dietary orders reviewed? No Do you have support at home? Yes   Home Care and Equipment/Supplies: Were home health services ordered? yes If so, what is the name of the agency? [facility]   3 times per week Has the agency set up a time to come to the patient's home? yes Were any new equipment or medical supplies ordered?  Yes   Delivered to hospital room What is the name of the medical supply agency?  Were you able to get the supplies/equipment?  Do you have any questions related to the use of the equipment or supplies?   Functional Questionnaire: (I = Independent and D = Dependent) ADLs: needs assistance.  Has  a Caregiver 3 days a week  Bathing/Dressing- has a caregiver  Meal Prep- has a caregiver  Eating- I  Maintaining continence- I  Transferring/Ambulation- using a walker  Managing Meds- I  Follow up appointments reviewed:  PCP Hospital f/u appt confirmed? Yes  Scheduled to see PCP on 09/24/2021  Specialist Hospital f/u appt confirmed? Yes  Scheduled to see NITKA  already completed Are transportation arrangements needed? No  If their condition worsens, is the pt aware to call PCP or go to the Emergency Dept.? Yes Was the patient provided with contact information for the PCP's office or ED? Yes Was to pt encouraged to call back with questions or concerns? Yes  Tomasa Rand, RN, BSN, CEN [facility] [employer] [phone]

## 2021-09-11 NOTE — Discharge Summary (Signed)
Patient ID: Carlee Vonderhaar [name] MRN: [id number] DOB/AGE: 52-Mar-1972 52 y.o.  Admit date: 08/20/2021 Discharge date: 09/11/2021  Admission Diagnoses:  Principal Problem:   Spondylolisthesis, lumbar region Active Problems:   Fusion of spine of lumbar region   Discharge Diagnoses:  Principal Problem:   Spondylolisthesis, lumbar region Active Problems:   Fusion of spine of lumbar region  status post Procedure(s): RIGHT L4-5 TRANSFORAMINAL LUMBAR INTERBODY FUSION WITH RODS, SCREWS AND CAGE, LOCAL BONE GRAFT, ALLOGRAFT BONE GRAFT, VIVIGEN  Past Medical History:  Diagnosis Date   Allergy    Anxiety    Arthritis    Asthma    Chronic back pain    Chronic chest pain    resolved, no problems since 2019 per patient 07/27/19   COPD (chronic obstructive pulmonary disease) ([place])    Depression    DM (diabetes mellitus) ([place])    INSULIN DEPENDENT - TYPE 1   Dyspnea    GERD (gastroesophageal reflux disease)    Headache(784.0)    History of hiatal hernia    Hyperlipidemia    no med, diet controlled   Hypertension    Hypokalemia    Pneumonia    Respiratory disease 05/2017   uses inhalers, neb tx prn, no oxygen   Sleep apnea    does not use CPAP due to trach   Trache[facility] in place [facility]) 02/2017    Surgeries: Procedure(s): RIGHT L4-5 TRANSFORAMINAL LUMBAR INTERBODY FUSION WITH RODS, SCREWS AND CAGE, LOCAL BONE GRAFT, ALLOGRAFT BONE GRAFT, VIVIGEN on 08/20/2021   Consultants: Treatment Team:  Little Ishikawa, MD Charlynne Cousins, MD  Discharged Condition: Improved  Hospital Course: Josselyn Harkins [name] is an 50 y.o. female who was admitted 08/20/2021 for operative treatment of Spondylolisthesis, lumbar region. Patient failed conservative treatments (please see the history and physical for the specifics) and had severe unremitting pain that affects sleep, daily activities and work/hobbies. After pre-op clearance, the patient was taken to the operating  room on 08/20/2021 and underwent  Procedure(s): RIGHT L4-5 TRANSFORAMINAL LUMBAR INTERBODY FUSION WITH RODS, SCREWS AND CAGE, LOCAL BONE GRAFT, ALLOGRAFT BONE GRAFT, VIVIGEN.    Patient was given perioperative antibiotics:  Anti-infectives (From admission, onward)    Start     Dose/Rate Route Frequency Ordered Stop   08/26/21 0000  amoxicillin-clavulanate (AUGMENTIN) 875-125 MG tablet        1 tablet Oral Every 12 hours 08/26/21 1409     08/25/21 1000  amoxicillin-clavulanate (AUGMENTIN) 875-125 MG per tablet 1 tablet  Status:  Discontinued        1 tablet Oral Every 12 hours 08/25/21 0718 08/25/21 0719   08/25/21 1000  amoxicillin-clavulanate (AUGMENTIN) 875-125 MG per tablet 1 tablet  Status:  Discontinued        1 tablet Oral Every 12 hours 08/25/21 0719 08/27/21 0034   08/23/21 1800  ceFEPIme (MAXIPIME) 2 g in sodium chloride 0.9 % 100 mL IVPB  Status:  Discontinued        2 g 200 mL/hr over 30 Minutes Intravenous Every 8 hours 08/23/21 0949 08/25/21 0719   08/22/21 2200  vancomycin (VANCOREADY) IVPB 750 mg/150 mL  Status:  Discontinued        750 mg 150 mL/hr over 60 Minutes Intravenous Every 12 hours 08/22/21 0854 08/25/21 0719   08/22/21 0915  ceFEPIme (MAXIPIME) 2 g in sodium chloride 0.9 % 100 mL IVPB  Status:  Discontinued        2 g 200 mL/hr over 30 Minutes Intravenous  Once  08/22/21 0818 08/22/21 0828   08/22/21 0915  metroNIDAZOLE (FLAGYL) IVPB 500 mg  Status:  Discontinued        500 mg 100 mL/hr over 60 Minutes Intravenous Every 12 hours 08/22/21 0818 08/23/21 0933   08/22/21 0915  vancomycin (VANCOREADY) IVPB 1000 mg/200 mL  Status:  Discontinued        1,000 mg 200 mL/hr over 60 Minutes Intravenous  Once 08/22/21 0818 08/22/21 0830   08/22/21 0900  ceFEPIme (MAXIPIME) 2 g in sodium chloride 0.9 % 100 mL IVPB  Status:  Discontinued        2 g 200 mL/hr over 30 Minutes Intravenous Every 12 hours 08/22/21 0829 08/23/21 0949   08/22/21 0900  vancomycin (VANCOREADY) IVPB  2000 mg/400 mL        2,000 mg 200 mL/hr over 120 Minutes Intravenous  Once 08/22/21 0830 08/22/21 1327   08/20/21 1800  ceFAZolin (ANCEF) IVPB 2g/100 mL premix        2 g 200 mL/hr over 30 Minutes Intravenous Every 8 hours 08/20/21 1527 08/21/21 0201   08/20/21 0617  ceFAZolin (ANCEF) 2-4 GM/100ML-% IVPB       Note to Pharmacy: Gleason, Ginger E: cabinet override      08/20/21 0617 08/20/21 1829   08/20/21 0615  ceFAZolin (ANCEF) IVPB 2g/100 mL premix        2 g 200 mL/hr over 30 Minutes Intravenous On call to O.R. 08/20/21 5974 08/20/21 0857        Patient was given sequential compression devices and early ambulation to prevent DVT.   Patient benefited maximally from hospital stay and there were no complications. At the time of discharge, the patient was urinating/moving their bowels without difficulty, tolerating a regular diet, pain is controlled with oral pain medications and they have been cleared by PT/OT.   Recent vital signs: No data found.   Recent laboratory studies: No results for input(s): WBC, HGB, HCT, PLT, NA, K, CL, CO2, BUN, CREATININE, GLUCOSE, INR, CALCIUM in the last 72 hours.  Invalid input(s): PT, 2   Discharge Medications:   Allergies as of 08/26/2021       Reactions   Reglan [metoclopramide] Other (See Comments)   Panic attack        Medication List     STOP taking these medications    oxyCODONE-acetaminophen 10-325 MG tablet Commonly known as: PERCOCET       TAKE these medications    albuterol 108 (90 Base) MCG/ACT inhaler Commonly known as: ProAir HFA Inhale 2 puffs into the lungs every 6 (six) hours as needed for wheezing or shortness of breath.   albuterol (2.5 MG/3ML) 0.083% nebulizer solution Commonly known as: PROVENTIL Take 3 mLs (2.5 mg total) by nebulization every 6 (six) hours as needed for wheezing or shortness of breath.   amoxicillin-clavulanate 875-125 MG tablet Commonly known as: AUGMENTIN Take 1 tablet by mouth every  12 (twelve) hours.   aspirin 81 MG EC tablet Take 1 tablet (81 mg total) by mouth daily. Swallow whole.   atorvastatin 20 MG tablet Commonly known as: LIPITOR Take 1 tablet (20 mg total) by mouth daily.   buPROPion 150 MG 12 hr tablet Commonly known as: Wellbutrin SR Take 1 tablet (150 mg total) by mouth 2 (two) times daily.   cetirizine 10 MG tablet Commonly known as: ZYRTEC Take 1 tablet (10 mg total) by mouth at bedtime.   dapagliflozin propanediol 5 MG Tabs tablet Commonly known as: [place] Take 1  tablet (5 mg total) by mouth daily before breakfast.   diclofenac Sodium 1 % Gel Commonly known as: Voltaren Apply 4 g topically 4 (four) times daily.   docusate sodium 100 MG capsule Commonly known as: COLACE Take 1 capsule (100 mg total) by mouth 2 (two) times daily.   Dulera 200-5 MCG/ACT Aero Generic drug: mometasone-formoterol Inhale 2 puffs into the lungs in the morning and at bedtime.   furosemide 20 MG tablet Commonly known as: LASIX Take 1 tablet (20 mg total) by mouth daily. What changed:  when to take this reasons to take this   gabapentin 600 MG tablet Commonly known as: NEURONTIN Take 600 mg by mouth 3 (three) times daily.   gabapentin 300 MG capsule Commonly known as: NEURONTIN Take 2 capsules (600 mg total) by mouth 2 (two) times daily.   hydrOXYzine 25 MG tablet Commonly known as: ATARAX Take 1 tablet (25 mg total) by mouth 3 (three) times daily as needed for anxiety.   Insulin Pen Needle 31G X 8 MM Misc Inject 1 Units as directed daily.   lisinopril 20 MG tablet Commonly known as: ZESTRIL Take 20 mg by mouth daily.   melatonin 3 MG Tabs tablet Take 3 mg by mouth at bedtime.   methocarbamol 750 MG tablet Commonly known as: ROBAXIN Take 750 mg by mouth every 8 (eight) hours as needed for muscle spasms.   Misc. Devices Misc Nebulizer device. Diagnosis - Asthma   Misc. Devices [employer].  Diagnosis-low back pain   Misc. Devices  Misc Shower chair   ondansetron 4 MG tablet Commonly known as: ZOFRAN TAKE 1 TABLET BY MOUTH EVERY 8 HOURS AS NEEDED FOR NAUSEA OR VOMITING   OneTouch Delica Lancets 38S Misc 1 each by Does not apply route daily. Use to monitor glucose levels daily; E11.9   OneTouch Verio test strip Generic drug: glucose blood Use to monitor glucose levels daily; E11.9   OneTouch Verio w/Device Kit 1 each by Does not apply route daily. Use to monitor glucose levels daily; E11.9   Ozempic (0.25 or 0.5 MG/DOSE) 2 MG/1.5ML Sopn Generic drug: Semaglutide(0.25 or 0.5MG/DOS) Inject 0.5 mg into the skin once a week.   pantoprazole 40 MG tablet Commonly known as: PROTONIX Take 1 tablet (40 mg total) by mouth 2 (two) times daily. What changed:  when to take this reasons to take this   polyethylene glycol 17 g packet Commonly known as: MIRALAX / GLYCOLAX Take 17 g by mouth daily as needed for mild constipation.   sertraline 100 MG tablet Commonly known as: ZOLOFT Take 1 tablet (100 mg total) by mouth at bedtime.   silver sulfADIAZINE 1 % cream Commonly known as: Silvadene Apply 1 application topically daily.   sucralfate 1 g tablet Commonly known as: Carafate Take 1 tablet (1 g total) by mouth 4 (four) times daily -  with meals and at bedtime. What changed: when to take this   SUMAtriptan 100 MG tablet Commonly known as: IMITREX At the onset of a migraine; may repeat in 2 hours. Max daily dose 218m   tiZANidine 4 MG tablet Commonly known as: ZANAFLEX Take 1 tablet (4 mg total) by mouth every 8 (eight) hours as needed for muscle spasms.   topiramate 100 MG tablet Commonly known as: Topamax Take 1.5 tablets (150 mg total) by mouth 2 (two) times daily.   TTyler AasFlexTouch 200 UNIT/ML FlexTouch Pen Generic drug: insulin degludec Inject 156 Units into the skin daily. What changed: when to  take this       ASK your doctor about these medications    Xtampza ER 18 MG C12a Generic  drug: oxyCODONE ER Take 1 tablet by mouth every 12 (twelve) hours for 7 days. Ask about: Should I take this medication?        Diagnostic Studies: DG Lumbar Spine Complete  Result Date: 08/20/2021 CLINICAL DATA:  L4-L5 TLIF EXAM: LUMBAR SPINE - COMPLETE 4+ VIEW COMPARISON:  04/01/2021 FLUOROSCOPY TIME:  1 minute 14 seconds Dose: 58.78 mGy Images: 4 FINDINGS: 5 lumbar vertebra on prior exam. Current images demonstrate placement of BILATERAL pedicle screws and posterior bars at L4-L5 with intervening disc prosthesis. No fracture or subluxation. IMPRESSION: Posterior fusion L4-L5. Electronically Signed   By: Lavonia Dana M.D.   On: 08/20/2021 14:51   DG CHEST PORT 1 VIEW  Result Date: 08/22/2021 CLINICAL DATA:  Dyspnea EXAM: PORTABLE CHEST 1 VIEW COMPARISON:  04/01/2021 FINDINGS: Tracheostomy tube in good position. Normal cardiac silhouette. No effusion, infiltrate or pneumothorax. Low lung volumes. IMPRESSION: 1. Tracheostomy tube in good position. 2.  No acute cardiopulmonary process. Electronically Signed   By: Suzy Bouchard M.D.   On: 08/22/2021 08:52   DG C-Arm 1-60 Min-No Report  Result Date: 08/20/2021 Fluoroscopy was utilized by the requesting physician.  No radiographic interpretation.   DG C-Arm 1-60 Min-No Report  Result Date: 08/20/2021 Fluoroscopy was utilized by the requesting physician.  No radiographic interpretation.   DG C-Arm 1-60 Min-No Report  Result Date: 08/20/2021 Fluoroscopy was utilized by the requesting physician.  No radiographic interpretation.    Discharge Instructions     Call MD / Call 911   Complete by: As directed    If you experience chest pain or shortness of breath, CALL 911 and be transported to the hospital emergency room.  If you develope a fever above 101 F, pus (white drainage) or increased drainage or redness at the wound, or calf pain, call your surgeon's office.   Constipation Prevention   Complete by: As directed    Drink plenty of  fluids.  Prune juice may be helpful.  You may use a stool softener, such as Colace (over the counter) 100 mg twice a day.  Use MiraLax (over the counter) for constipation as needed.   Diet - low sodium heart healthy   Complete by: As directed    Discharge instructions   Complete by: As directed    Call if there is increasing drainage, fever greater than 101.5, severe head aches, and worsening nausea or light sensitivity. If shortness of breath, bloody cough or chest tightness or pain go to an emergency room. No lifting greater than 10 lbs. Avoid bending, stooping and twisting. Use brace when sitting and out of bed even to go to bathroom. Walk in house for first 2 weeks then may start to get out slowly increasing distances up to one mile by 4-6 weeks post op. After 5 days may shower and change dressing following bathing with shower.When bathing remove the brace shower and replace brace before getting out of the shower. If drainage, keep dry dressing and do not bathe the incision, use an moisture impervious dressing. Please call and return for scheduled follow up appointment 2 weeks from the time of surgery. Take ferrous gluconate to build up blood volumn as you are anemic and slowly transition from sitting to standing and from lying to sitting to avoid feeling Weak or drowsy. Narcotics decrease blood pressure. Antihypertensives lower blood pressure.  Driving restrictions   Complete by: As directed    No driving for 4 weeks   Increase activity slowly as tolerated   Complete by: As directed    Lifting restrictions   Complete by: As directed    No lifting for 12 weeks   Post-operative opioid taper instructions:   Complete by: As directed    POST-OPERATIVE OPIOID TAPER INSTRUCTIONS: It is important to wean off of your opioid medication as soon as possible. If you do not need pain medication after your surgery it is ok to stop day one. Opioids include: Codeine, Hydrocodone(Norco,  Vicodin), Oxycodone(Percocet, oxycontin) and hydromorphone amongst others.  Long term and even short term use of opiods can cause: Increased pain response Dependence Constipation Depression Respiratory depression And more.  Withdrawal symptoms can include Flu like symptoms Nausea, vomiting And more Techniques to manage these symptoms Hydrate well Eat regular healthy meals Stay active Use relaxation techniques(deep breathing, meditating, yoga) Do Not substitute Alcohol to help with tapering If you have been on opioids for less than two weeks and do not have pain than it is ok to stop all together.  Plan to wean off of opioids This plan should start within one week post op of your joint replacement. Maintain the same interval or time between taking each dose and first decrease the dose.  Cut the total daily intake of opioids by one tablet each day Next start to increase the time between doses. The last dose that should be eliminated is the evening dose.           Follow-up Information     [facility] Follow up.   Specialty: [place] Why: the agency (now known as [facility]) will call to schedule home health visits (PT/OT/SLP) Contact information: [place] [address] [phone]         Jessy Oto, MD Follow up in 1 week(s).   Specialty: Orthopedic Surgery Why: For wound re-check Contact information: [place] [address] [phone]                 Discharge Plan:  discharge to home  Disposition:     Signed: Benjiman Core  09/11/2021, 7:45 AM

## 2021-09-12 ENCOUNTER — Other Ambulatory Visit: Payer: Self-pay | Admitting: Family Medicine

## 2021-09-12 ENCOUNTER — Other Ambulatory Visit: Payer: Self-pay | Admitting: Specialist

## 2021-09-13 MED ORDER — OXYCODONE HCL 10 MG PO TABS: 10.0000 mg | ORAL_TABLET | ORAL | 0 refills | Status: DC | PRN

## 2021-09-13 MED ORDER — OXYCODONE ER 18 MG PO C12A: 1.0000 | EXTENDED_RELEASE_CAPSULE | Freq: Two times a day (BID) | ORAL | 0 refills | Status: AC

## 2021-09-16 ENCOUNTER — Other Ambulatory Visit: Payer: Self-pay | Admitting: Specialist

## 2021-09-16 ENCOUNTER — Other Ambulatory Visit: Payer: Self-pay | Admitting: Family Medicine

## 2021-09-16 DIAGNOSIS — M5136 Other intervertebral disc degeneration, lumbar region: Secondary | ICD-10-CM

## 2021-09-17 ENCOUNTER — Other Ambulatory Visit: Payer: Self-pay | Admitting: Family Medicine

## 2021-09-17 MED ORDER — METHOCARBAMOL 750 MG PO TABS: 750.0000 mg | ORAL_TABLET | Freq: Three times a day (TID) | ORAL | 5 refills | Status: DC | PRN

## 2021-09-18 ENCOUNTER — Telehealth: Payer: Self-pay | Admitting: Specialist

## 2021-09-18 NOTE — Telephone Encounter (Signed)
I scheduled her to see Jeneen Rinks 09/19/21

## 2021-09-18 NOTE — Telephone Encounter (Signed)
Patient complaining of radiating pain bilaterally from the hip down. The right leg is worse then the left. She also reports some numbness. Please follow up with patient.

## 2021-09-19 ENCOUNTER — Ambulatory Visit (INDEPENDENT_AMBULATORY_CARE_PROVIDER_SITE_OTHER): Payer: 59 | Admitting: Surgery

## 2021-09-19 ENCOUNTER — Other Ambulatory Visit: Payer: Self-pay

## 2021-09-19 DIAGNOSIS — M7061 Trochanteric bursitis, right hip: Secondary | ICD-10-CM | POA: Diagnosis not present

## 2021-09-19 DIAGNOSIS — Z981 Arthrodesis status: Secondary | ICD-10-CM

## 2021-09-19 MED ORDER — METHYLPREDNISOLONE ACETATE 40 MG/ML IJ SUSP
40.0000 mg | INTRAMUSCULAR | Status: AC | PRN
  Administered 2021-09-19: 17:00:00 40 mg via INTRA_ARTICULAR

## 2021-09-19 MED ORDER — LIDOCAINE HCL 1 % IJ SOLN
3.0000 mL | INTRAMUSCULAR | Status: AC | PRN
  Administered 2021-09-19: 17:00:00 3 mL

## 2021-09-19 MED ORDER — BUPIVACAINE HCL 0.25 % IJ SOLN
7.0000 mL | INTRAMUSCULAR | Status: AC | PRN
Start: 2021-09-19 — End: 2021-09-19
  Administered 2021-09-19: 17:00:00 7 mL via INTRA_ARTICULAR

## 2021-09-19 NOTE — Progress Notes (Signed)
Office Visit Note   Patient: [name]           Date of Birth: [birth year]           MRN: [id number] Visit Date: 09/19/2021              Requested by: [name], MD [place],  [place] [address] PCP: [name], MD   Assessment & Plan: Visit Diagnoses:  1. S/P lumbar fusion   2. Greater trochanteric bursitis, right     Plan: In hopes of giving patient relief of her ongoing right lateral hip pain offered injection.  After patient consent right lateral hip was prepped with Betadine and greater trochanter bursa Marcaine/Depo-Medrol 7:1 injection performed.  After sitting for few minutes patient did report very good relief of her lateral hip pain with anesthetic in place.  I asked her to use ice off-and-on tonight.  She was also encouraged to strictly monitor her blood sugars and adjust her insulin as needed in case the injection causes elevation of her sugars.  CBG in clinic today 185.  I gave her a prescription to show her home health physical therapist and they can evaluate and treat her right hip greater trochanteric bursitis.  Follow-Up Instructions: Return for Keep appointment with Dr. Louanne Skye as scheduled in a couple of weeks.   Orders:  No orders of the defined types were placed in this encounter.  No orders of the defined types were placed in this encounter.     Procedures: Large Joint Inj: R greater trochanter on 09/19/2021 4:58 PM Indications: pain Details: 22 G 3.5 in needle, lateral approach Medications: 3 mL lidocaine 1 %; 7 mL bupivacaine 0.25 %; 40 mg methylPREDNISolone acetate 40 MG/ML Outcome: tolerated well, no immediate complications Consent was given by the patient. Patient was prepped and draped in the usual sterile fashion.      Clinical Data: No additional findings.   Subjective: Chief Complaint  Patient presents with   Right Leg - Pain    HPI 50 year old black female who is status post right L4-5  transforaminal fusion August 20, 2021 by Dr. Louanne Skye returns.  States that she has been having ongoing pain in the right greater than left lower extremity.  Right leg most of her pain is localized to the lateral hip around the greater trochanter bursa and this extends down the IT band.  Similar pain on the left side but not as bad.  Pain was discussed with Dr. Louanne Skye last office visit a couple weeks ago.  States that right lateral hip pain aggravated when she is ambulating and laying on her side.   Objective: Vital Signs: LMP  (LMP Unknown)   Physical Exam Pleasant female alert and oriented in no acute distress.  Circumcision is healing very well.  No signs of infection.  Negative lower bilateral hips.  She has marked tenderness over the right hip greater trochanter bursa and tenderness extends down the course of the IT band.  Less tender over the left side. Ortho Exam  Specialty Comments:  No specialty comments available.  Imaging: No results found.   PMFS History: Patient Active Problem List   Diagnosis Date Noted   Fusion of spine of lumbar region 08/20/2021   Spondylolisthesis, lumbar region 08/20/2021    Class: Chronic   Chronic respiratory failure with hypoxia ([place]) 06/20/2021   Preop pulmonary/respiratory exam 06/20/2021   Abdominal pain, epigastric 11/28/2020   Nausea and vomiting 11/28/2020   COVID-19 09/16/2020  Dyspnea 08/26/2020   Rotator cuff tear 07/28/2019   Insulin dependent type 2 diabetes mellitus ([place]) 04/12/2019   Chest pain of uncertain etiology 54/65/6812   Lactic acidosis 03/22/2019   Chronic right shoulder pain    Community acquired pneumonia 10/25/2018   Asthma, chronic, unspecified asthma severity, with acute exacerbation 10/25/2018   Acute respiratory failure with hypoxia and hypercapnia (HCC) 10/23/2018   Acute pain of right shoulder due to trauma 10/05/2018   Chronic bilateral low back pain without sciatica 10/05/2018   Dysphonia 10/05/2018    Severe asthma with exacerbation 10/05/2018   Rotator cuff arthropathy, right 06/15/2018   AKI (acute kidney injury) ([place]) 03/06/2018   Carpal tunnel syndrome 01/18/2018   Heart failure ([place]) 05/20/2017   Normocytic anemia 05/06/2017   Tracheostomy dependent ([place]) 03/26/2017   Dysphagia 03/26/2017   Status post tracheostomy ([place]) 03/14/2017   Klebsiella cystitis 03/14/2017   Morbid obesity ([place]) 03/13/2017   Subglottic stenosis 03/06/2017   Asthma 03/06/2017   Chronic diastolic heart failure ([place]) 03/06/2017   Tracheal stenosis 03/04/2017   Acute blood loss anemia    Generalized anxiety disorder    Chronic pain syndrome    GERD (gastroesophageal reflux disease) 02/22/2016   Depression 02/22/2016   Migraine 01/30/2015   Bulging lumbar disc 09/05/2013   Essential hypertension 09/05/2013   Allergic rhinitis, seasonal 08/11/2012   Chronic cough 08/11/2012   OSA (obstructive sleep apnea) 12/04/2011   Past Medical History:  Diagnosis Date   Allergy    Anxiety    Arthritis    Asthma    Chronic back pain    Chronic chest pain    resolved, no problems since 2019 per patient 07/27/19   COPD (chronic obstructive pulmonary disease) (HCC)    Depression    DM (diabetes mellitus) ([place])    INSULIN DEPENDENT - TYPE 1   Dyspnea    GERD (gastroesophageal reflux disease)    Headache(784.0)    History of hiatal hernia    Hyperlipidemia    no med, diet controlled   Hypertension    Hypokalemia    Pneumonia    Respiratory disease 05/2017   uses inhalers, neb tx prn, no oxygen   Sleep apnea    does not use CPAP due to trach   Tracheostomy in place [facility]) 02/2017    Family History  Problem Relation Age of Onset   Heart attack Mother    Stroke Mother    Diabetes Mother    Hypertension Mother    Arthritis Mother    Stroke Father    Asthma Sister    Hypertension Sister    Asthma Sister    Diabetes Sister    Asthma Brother    Seizures Brother    Asthma Brother    Diabetes Brother     Asthma Daughter    Asthma Son    Asthma Grandson    Colon cancer Neg Hx    Rectal cancer Neg Hx    Stomach cancer Neg Hx     Past Surgical History:  Procedure Laterality Date   ABDOMINAL HYSTERECTOMY  2009   APPENDECTOMY     CESAREAN SECTION     x 3   CHOLECYSTECTOMY N/A 03/02/2014   Procedure: LAPAROSCOPIC CHOLECYSTECTOMY;  Surgeon: Joyice Faster. Cornett, MD;  Location: [facility];  Service: General;  Laterality: N/A;   HERNIA REPAIR     PANENDOSCOPY N/A 03/04/2017   Procedure: PANENDOSCOPY WITH POSSIBLE FOREIGN BODY REMOVAL;  Surgeon: Jodi Marble, MD;  Location[facility]  OR;  Service: ENT;  Laterality: N/A;   ROTATOR CUFF REPAIR Left    ROTATOR CUFF REPAIR Right 07/27/2019   SHOULDER ARTHROSCOPY WITH SUBACROMIAL DECOMPRESSION, ROTATOR CUFF REPAIR AND BICEP TENDON REPAIR Right 07/28/2019   Procedure: RIGHT SHOULDER ARTHROSCOP, MINI OPEN ROTATOR CUFF TEAR REPAIR,  BICEPS TENODESIS, DISTAL CLAVICLE EXCISION;  Surgeon: Meredith Pel, MD;  Location: [facility];  Service: Orthopedics;  Laterality: Right;   TOOTH EXTRACTION N/A 02/22/2021   Procedure: DENTAL RESTORATION/EXTRACTIONS;  Surgeon: Diona Browner, DMD;  Location: [facility] OR;  Service: Oral Surgery;  Laterality: N/A;   TRACHEOSTOMY  02/2017   TUBAL LIGATION     VESICOVAGINAL FISTULA CLOSURE W/ TAH  2009   Social History   Occupational History   Not on file  Tobacco Use   Smoking status: Former    Packs/day: 0.50    Years: 25.00    Pack years: 12.50    Types: Cigarettes    Quit date: 01/21/2016    Years since quitting: 5.6   Smokeless tobacco: Never  Vaping Use   Vaping Use: Never used  Substance and Sexual Activity   Alcohol use: Yes    Alcohol/week: 0.0 standard drinks    Comment: social wine   Drug use: Yes    Types: Marijuana    Comment: couple times a month   Sexual activity: Yes    Partners: Male    Birth control/protection: Surgical    Comment: Hysterectomy

## 2021-09-24 ENCOUNTER — Other Ambulatory Visit: Payer: Self-pay | Admitting: Specialist

## 2021-09-24 ENCOUNTER — Ambulatory Visit: Payer: 59 | Attending: Family Medicine | Admitting: Family Medicine

## 2021-09-24 ENCOUNTER — Other Ambulatory Visit: Payer: Self-pay | Admitting: Family Medicine

## 2021-09-24 ENCOUNTER — Encounter: Payer: Self-pay | Admitting: Family Medicine

## 2021-09-24 ENCOUNTER — Other Ambulatory Visit: Payer: Self-pay

## 2021-09-24 DIAGNOSIS — E1069 Type 1 diabetes mellitus with other specified complication: Secondary | ICD-10-CM

## 2021-09-24 DIAGNOSIS — G43119 Migraine with aura, intractable, without status migrainosus: Secondary | ICD-10-CM | POA: Diagnosis not present

## 2021-09-24 DIAGNOSIS — J4540 Moderate persistent asthma, uncomplicated: Secondary | ICD-10-CM

## 2021-09-24 DIAGNOSIS — F330 Major depressive disorder, recurrent, mild: Secondary | ICD-10-CM | POA: Diagnosis not present

## 2021-09-24 DIAGNOSIS — E1022 Type 1 diabetes mellitus with diabetic chronic kidney disease: Secondary | ICD-10-CM

## 2021-09-24 DIAGNOSIS — N1831 Chronic kidney disease, stage 3a: Secondary | ICD-10-CM

## 2021-09-24 DIAGNOSIS — I10 Essential (primary) hypertension: Secondary | ICD-10-CM

## 2021-09-24 DIAGNOSIS — E785 Hyperlipidemia, unspecified: Secondary | ICD-10-CM

## 2021-09-24 MED ORDER — BUPROPION HCL ER (SR) 150 MG PO TB12: 150.0000 mg | ORAL_TABLET | Freq: Two times a day (BID) | ORAL | 1 refills | Status: DC

## 2021-09-24 MED ORDER — GABAPENTIN 600 MG PO TABS: 600.0000 mg | ORAL_TABLET | Freq: Three times a day (TID) | ORAL | 3 refills | Status: DC

## 2021-09-24 MED ORDER — ATORVASTATIN CALCIUM 20 MG PO TABS: 20.0000 mg | ORAL_TABLET | Freq: Every day | ORAL | 1 refills | Status: DC

## 2021-09-24 MED ORDER — SUMATRIPTAN SUCCINATE 100 MG PO TABS: ORAL_TABLET | ORAL | 6 refills | Status: DC

## 2021-09-24 MED ORDER — SERTRALINE HCL 100 MG PO TABS: 100.0000 mg | ORAL_TABLET | Freq: Every day | ORAL | 1 refills | Status: DC

## 2021-09-24 MED ORDER — GABAPENTIN 300 MG PO CAPS: 600.0000 mg | ORAL_CAPSULE | Freq: Two times a day (BID) | ORAL | 1 refills | Status: DC

## 2021-09-24 MED ORDER — LISINOPRIL 20 MG PO TABS: 20.0000 mg | ORAL_TABLET | Freq: Every day | ORAL | 1 refills | Status: DC

## 2021-09-24 MED ORDER — TOPIRAMATE 100 MG PO TABS: 150.0000 mg | ORAL_TABLET | Freq: Two times a day (BID) | ORAL | 1 refills | Status: DC

## 2021-09-24 MED ORDER — ALBUTEROL SULFATE (2.5 MG/3ML) 0.083% IN NEBU: 2.5000 mg | INHALATION_SOLUTION | Freq: Four times a day (QID) | RESPIRATORY_TRACT | 3 refills | Status: DC | PRN

## 2021-09-24 MED ORDER — OXYCODONE HCL 10 MG PO TABS: 10.0000 mg | ORAL_TABLET | ORAL | 0 refills | Status: DC | PRN

## 2021-09-24 MED ORDER — ALBUTEROL SULFATE HFA 108 (90 BASE) MCG/ACT IN AERS: 2.0000 | INHALATION_SPRAY | Freq: Four times a day (QID) | RESPIRATORY_TRACT | 6 refills | Status: DC | PRN
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
## 2021-09-25 ENCOUNTER — Telehealth: Payer: Self-pay | Admitting: Specialist

## 2021-09-25 NOTE — Telephone Encounter (Signed)
Almira from [facility] called. She would like to know if the RX for therapy that the patient has, is it for continued home PT or is it outpatient? Her call back number is [phone]

## 2021-09-25 NOTE — Telephone Encounter (Signed)
I called and spoke with [name] and advised that per Dr. Otho Ket OV note it says " I gave her a prescription to show her home health physical therapist and they can evaluate and treat her right hip greater trochanteric bursitis." [name] states that she will continue PT then, that she wasn't sure if he wanted to stop PT or continue it.

## 2021-10-01 ENCOUNTER — Other Ambulatory Visit: Payer: Self-pay | Admitting: Specialist

## 2021-10-01 MED ORDER — OXYCODONE-ACETAMINOPHEN 7.5-325 MG PO TABS: 1.0000 | ORAL_TABLET | Freq: Four times a day (QID) | ORAL | 0 refills | Status: AC | PRN

## 2021-10-01 NOTE — Op Note (Signed)
08/20/2021  7:41 PM  PATIENT:  [name]  51 y.o. female  MRN: [id number]  OPERATIVE REPORT  PRE-OPERATIVE DIAGNOSIS:  L4-5 central disc herniation with radiculopathy, degenerative disc disease, Grade I spondylolisthesis  POST-OPERATIVE DIAGNOSIS:  L4-5 central disc herniation with radiculopathy, degenerative disc disease, Grade I spondylolisthesis  PROCEDURE:  Procedure(s): RIGHT L4-5 TRANSFORAMINAL LUMBAR INTERBODY FUSION WITH RODS, SCREWS AND CAGE, LOCAL BONE GRAFT, ALLOGRAFT BONE GRAFT, VIVIGEN    SURGEON:  Jessy Oto, MD     ASSISTANT: Benjiman Core, PA-C  (Present throughout the entire procedure and necessary for completion of procedure in a timely manner)     ANESTHESIA:  General, supplemented with local marcaine 0.5% 1:1 exparel 1.3% total 20cc.    COMPLICATIONS:  None.   DRAINS: Foley to SD.  EBL: 250CC     COMPONENTS:   Implant Name Type Inv. Item Serial No. Manufacturer Lot No. LRB No. Used Action  BONE VIVIGEN FORMABLE 10CC - [id number] Bone Implant BONE VIVIGEN FORMABLE 10CC [id number] Hoffman  N/A 1 Implanted  INTERBODY SABLE 10X26 7-14 15D - [id number] Miscellaneous INTERBODY SABLE 10X26 7-14 15D  [employer] [id number] N/A 1 Implanted  CAP LOCKING THREADED - [id number] Cap CAP LOCKING THREADED  [employer]  N/A 4 Implanted  SCREW PA THRD CREO TULIP 5.5X4 - [id number] Head SCREW PA THRD CREO TULIP 5.5X4  [employer]  N/A 4 Implanted  SCREW PA THRD CREO TULIP 5.5X4 - [id number] Head SCREW PA THRD CREO TULIP 5.5X4  [employer]  N/A 3 Implanted and Explanted  SCREW SD MOD CREO 6.5X45 - [id number] Screw SCREW SD MOD CREO 6.5X45  [employer]  N/A 2 Implanted  SCREW SD MOD CREO 6.5X40 - [id number] Screw SCREW SD MOD CREO 6.5X40  [employer]  N/A 3 Implanted  ROD CREO 45MM SPINAL - [id number] Rod ROD CREO 45MM SPINAL  [employer]  N/A 2 Implanted    PROCEDURE:The patient was met in the holding area, and the appropriate  lumbar level right L4-5  identified and marked with an x and my initials.The patient was then transported to OR. The patient was then placed under general anesthesia without difficulty.The patient received appropriate preoperative antibiotic prophylaxis ancef.  Nursing staff inserted a Foley catheter under sterile conditions. She was then turned to a prone position [place] spine table was used for this case. All pressure points were well padded PAS stocking applied bilateral lower extremity to prevent DVT. Standard prep DuraPrep solution. Draped in the usual manner. Time-out procedure was called and correct .   The incision was at L4-5 and determined using C-arm to mark the inferior L5 pedicles and  an additionally extended up to the L3 spinous process.   Bovie electric cautery was used to control bleeding and carefully dissection was carried down along the lateral aspects of the spinous process of L3 to L5. Cobb then used to carefully elevate the paralumbar muscle and the incision in the midline was carried to to the level of the base of the residual spinous processes. The posterior exposure area extending from the base of the spinous process of L3 to the superior aspect of L5 was carried expose at its edges debrided the muscle attachments using a Leskell. Time-out procedure was called and correct. Skin in the midline between L2 and L5 was then infiltrated with local anesthesia, marcaine 1/2% 1:1 exparel 1.3% total 20 cc used. Incision was then made  extending from L2-L5  through the skin and subcutaneous layers down to  the patient's lumbodorsal fascia and spinous processes. The incision then carried sharply excising the supraspinous ligament and then continuing the lateral aspect of the spinous processes of L3,L4 and L5. Cobb elevator used to carefully elevate the paralumbar muscles off of the posterior elements using electrocautery carefully drilled bleeding and perform dissection of the muscle tissues of  the preserving the facet capsule at the L3-4. Continuing the exposure out laterally to expose the lateral margin of the facet joint line at L4-5 and L3-4. Incision was carried in the midline down to the L5 level area bleeders controlled using electrocautery monopolar electrocautery.  °  °C-arm fluoroscopy was then brought into the field and using C-arm fluoroscopy then a hole made into the medial aspect of the pedicle of L4 using a high speed burr observed in the pedicle using C arm at the 5 oclock position on the left L4 pedicle nerve probe initial entry was determined on fluoroscopy to be good position alignment so that a 4.0mm tap was passed to 45 mm within the left L4 pedicle to a depth of nearly 45 mm observed on C-arm fluoroscopy to be beyond the midpoint of the lumbar vertebra and then position alignment within the left L4 pedicle this was then removed and the pedicle channel probed demonstrating patency no sign of rupture the cortex of the pedicle. Tapping with a 4.5 mm screw tap then 5.5 mm tap then a 6.5 mm and  a 6.5 mm x 45 mm screw was placed on the left side pedicle at the L4 level. C-arm fluoroscopy was then brought into the field and using C-arm fluoroscopy then a hole made into the posterior medial aspect of the pedicle of right L4 observed in the pedicle using ball tipped nerve hook and hockey stick nerve probe initial entry was determined on fluoroscopy to be good position alignment so that 4.5mm tap was then used to tap the right L4 pedicle to a depth of nearly 45 mm observed on C-arm fluoroscopy to be beyond the midpoint of the lumbar vertebra and then position alignment within the right L4 pedicle this was then removed and the pedicle channel probed demonstrating patency no sign of rupture the cortex of the pedicle. Tapping with a 5.5mm screw tap then tapping with a 6.5 mm tap, a 6.5 mm x 45 mm screw shank was placed.C-arm fluoroscopy was then brought into the field and using C-arm fluoroscopy  then a hole made into the posterior and medial aspect of the left pedicle of L5 observed in the pedicle using ball tipped nerve hook and hockey stick nerve probe initial entry was determined on fluoroscopy to be good position alignment so that a 4.5 mm tap was then used to tap the left L4 pedicle to a depth of nearly 40 mm observed on C-arm fluoroscopy to be beyond the posterior one third of the lumbar vertebra and good position alignment within the left L5 pedicle this was then removed and the pedicle channel probed demonstrating patency no sign of rupture the cortex of the pedicle. Tapping with a 4.5 mm screw tap then a 5.5 mm tap then tapping with a 6.5 mm and  a 6.5 mm x 40 mm screw was placed on the left side at the L5 level. The pedicle channel of L5 on the left probed demonstrating patency no sign of rupture the cortex of the pedicle.C-arm fluoroscopy was then brought into the field and using C-arm fluoroscopy then a hole made into the posterior and medial aspect of   the right pedicle of L5 observed in the pedicle using ball tipped nerve hook and hockey stick nerve probe initial entry was determined on fluoroscopy to be good position alignment so that a 4.5mm tap was then used to tap the right L5 pedicle to a depth of nearly 40 mm observed on C-arm fluoroscopy to be beyond the posterior one third of the lumbar vertebra and good position alignment within the right L5 pedicle this was then removed and the pedicle channel probed demonstrating patency no sign of rupture the cortex of the pedicle. Tapping with a 4.5mm screw tap then up to a 6.5mm tap then 6.5 mm x 40 mm screw shank was placed on the right side at the L5 level. The pedicle channel of L5 on the right probed demonstrating patency no sign of rupture the cortex of the pedicle.   °The right L4 lamina inferiorly was then resected.  Leksell rongeur used to resect inferior aspect of the lamina on the right side at the L4 level and partially on the left side  at L4 The right medial 40% of the facet of L4-5 were resected in order to decompress the right side of the lumbar thecal sac at L4-5 and decompress the right  L4 and L5 neuroforamen. Osteotomes and 2mm and 3mm kerrisons were used for this portion of the decompression. Near complete facetectomy was perform on the right at L4-5 to provide for exposure of the right side L4-5 neuroforamen for ease of placement of TLIF (transforaminal lumbar interbody fusion) at the L4 level inferior portions of the lamina and pars were also resected first beginning with the Leksell rongeur and osteotomes and then resecting using 2 and 3 mm Kerrison. Continued laminectomy was carried out resecting the central portions of the lamina of L4 and upper L5 performing foraminotomies on the right side at the L4 and L5 levels. The inferior articular process  L4 was resected on the right side.  A large amount of hypertrophic ligmentum flavum was found impressing on the right lateral recesses at L4-5 and narrowing the respective L4 and L5 neuroforamen. The OR microscope was draped sterilely and its magnification was used during this portion procedure. Then the operating room microscope sterilely draped and brought into the field.  Attention then turned to placement of the transforaminal lumbar interbody fusion cage.  Bleeding controlled using bipolar electrocautery thrombin soaked gel cottonoids. The right L4-5 level the exposure the posterior lateral aspect this was carried out using a Penfield 4 bipolar electrocautery to control small bleeders present. Derricho retractor used to retract the thecal sac and L4 nerve root a 15 blade scalpel was used to incise posterior lateral aspect of the right L4-5 disc the disc space at this level showed a rather severe narrowing posteriorly was more open anteriorly so that an osteotome again was used to resect a small portion the posterior superior lip of the vertebral body at L5  in order to gain ease of  access into the L4-5 disc space. The space was debrided of degenerative disc material using pituitary along root the entire disc space was then debrided of degenerative disc material using pituitary rongeurs curettage down to bleeding bone endplates. 7mm, 8 mm and 9 mm shavers were used to debride the disc space and pituitary ronguers used to remove the loosened debris. This space was then carefully assess using spacers  a 10 x 26 x 7-14mm adjustable height Sable cage with 15 degrees lordosis provided the best fit the cage packed with   local bone graft and vivigen and cancellous allograft chips were placed into the intervertebral disc space. The posterior intervertebral disc space was previously packed with autogenous local bone graft that been harvested from the central laminectomy and vivigen bone graft, allograft. Bleeding controlled using bipolar electrocautery.  Observed on C-arm fluoroscopy to be in good position alignment. The cage at L4-5 was placed anteriorly as best as possible the correct patient's lordosis. The cage was then raised with the inserted screw driver and the LIFT mechanism deployed. The cage was then filled with vivigen bone graft. With this then the transforaminal lumbar interbody fusion portion of the case was completed bleeders were controlled using bipolar electrocautery thrombin-soaked Gelfoam were appropriate.Decortication of the left facet joint carried out at L4-5. These were packed with cancellous local bone graft.  The  2 tulip fastener on the right were each placed and then each fastener carefully aligned  to allow for placement of rods. The left rod was a precontoured 45 mm rod. This was then placed into the pedicle screws on the left extending from L4-L5 each of the caps were carefully placed loosely tightened.Caps onto the left L4 fastener was tightened to 80 foot lbs. Across the right side a precontoured 45mm titanium rod was placed into the L4 and L5  screw fasteners and the  upper cap tightened to 80 foot lbs., compression was obtained on the right side between L5 and L4 compressing between the fasteners and tightening the screw caps 85 pounds.Copious amounts of saline solution this was done throughout the case.  Hockey stick neuroprobe was used to probe the neuroforamen bilateral L4 and L5 these were determined to be well decompressed. Permanent C-arm images were obtained in AP and lateral plane and oblique planes. Remaining local bone graft was then applied along both lateral posterior lateral region extending from right L4 to L5 facet bed.Gelfoam was then removed spinal canal. The lumbodorsal musculature carefully exam debrided of any devitalized tissue following removal of self retaining retractors were the bleeders were controlled using electrocautery and the area dorsal lumbar muscle were then approximated in the midline with interrupted #1 Vicryl sutures loose the dorsal fascia was reattached to the spinous process of L3  superiorly and L5  inferiorly this was done with #1 Vicryl sutures. Subcutaneous layers then approximated using interrupted 0 Vicryl sutures and 2-0 Vicryl sutures. Skin was closed with stainless steel staples, then MedPlex bandage. All instrument and sponge counts were correct. The patient was then returned to a supine position on her bed reactivated extubated and returned to the recovery room in satisfactory condition. °   ° Owens PA-C perform the duties of assistant surgeon during this case. He was present from the beginning of the case to the end of the case assisting in transfer the patient from his stretcher to the OR table and back to the stretcher at the end of the case. Assisted in careful retraction and suction of the laminectomy site delicate neural structures operating under the operating room microscope. He performed closure of the incision from the fascia to the skin applying the dressing.  ° °  ° , MD °10/01/2021,7:41 PM  °

## 2021-10-03 ENCOUNTER — Encounter: Payer: Self-pay | Admitting: Specialist

## 2021-10-03 ENCOUNTER — Ambulatory Visit (INDEPENDENT_AMBULATORY_CARE_PROVIDER_SITE_OTHER): Payer: 59

## 2021-10-03 ENCOUNTER — Ambulatory Visit (INDEPENDENT_AMBULATORY_CARE_PROVIDER_SITE_OTHER): Payer: 59 | Admitting: Specialist

## 2021-10-03 ENCOUNTER — Other Ambulatory Visit: Payer: Self-pay

## 2021-10-03 VITALS — BP 165/100 | HR 114 | Ht 61.0 in | Wt 240.0 lb

## 2021-10-03 DIAGNOSIS — Z981 Arthrodesis status: Secondary | ICD-10-CM

## 2021-10-03 NOTE — Progress Notes (Signed)
Post-Op Visit Note   Patient: [name]           Date of Birth: 10/14/1970           MRN: [id number] Visit Date: 10/03/2021 PCP: [name], MD   Assessment & Plan: 6 weeks post L4-5 right TLIF, Right lateral hip greater trochanteric bursitis. Pain mainly right lateral hip over greater trochanter, had injection by Jeneen Rinks it only lasted a couple of days Now decreasing the frequency of narcotics, however she reports she was in pain management for 1 year And pain management will resume their prescriptions for chronic pain. She is now 6 weeks post op  And should be able to wean to previous levels of medication. Motor with right thigh quad weakness, DF is 5-/5 as is quad. Seeing PT at home and they are to work on right greater trochanteric bursitis and leg strengthening.   Chief Complaint:  Chief Complaint  Patient presents with   Lower Back - Follow-up, Routine Post Op    Had right L4-5 TLIF (10 weeks, 6 days postop)   Visit Diagnoses:  1. S/P lumbar fusion     Plan: Avoid frequent bending and stooping  No lifting greater than 10 lbs. May use ice or moist heat for pain. Weight loss is of benefit. Brace for about 6 more weeks Exercise is important to improve your indurance and does allow people to function better inspite of back pain.  Motor as above right quad and right foot DF 5-/5  Incision with some upper incision tissue plication but expect with will return to normal as the sutures dissolve.   Follow-Up Instructions: Return in about 4 weeks (around 10/31/2021).   Orders:  Orders Placed This Encounter  Procedures   XR Lumbar Spine 2-3 Views   No orders of the defined types were placed in this encounter.   Imaging: No results found.  PMFS History: Patient Active Problem List   Diagnosis Date Noted   Spondylolisthesis, lumbar region 08/20/2021    Priority: High    Class: Chronic   Fusion of spine of lumbar region 08/20/2021   Chronic  respiratory failure with hypoxia ([place]) 06/20/2021   Preop pulmonary/respiratory exam 06/20/2021   Abdominal pain, epigastric 11/28/2020   Nausea and vomiting 11/28/2020   COVID-19 09/16/2020   Dyspnea 08/26/2020   Rotator cuff tear 07/28/2019   Insulin dependent type 2 diabetes mellitus ([place]) 04/12/2019   Chest pain of uncertain etiology 35/57/3220   Lactic acidosis 03/22/2019   Chronic right shoulder pain    Community acquired pneumonia 10/25/2018   Asthma, chronic, unspecified asthma severity, with acute exacerbation 10/25/2018   Acute respiratory failure with hypoxia and hypercapnia ([place]) 10/23/2018   Acute pain of right shoulder due to trauma 10/05/2018   Chronic bilateral low back pain without sciatica 10/05/2018   Dysphonia 10/05/2018   Severe asthma with exacerbation 10/05/2018   Rotator cuff arthropathy, right 06/15/2018   AKI (acute kidney injury) ([place]) 03/06/2018   Carpal tunnel syndrome 01/18/2018   Heart failure ([place]) 05/20/2017   Normocytic anemia 05/06/2017   Tracheostomy dependent ([place]) 03/26/2017   Dysphagia 03/26/2017   Status post tracheostomy ([place]) 03/14/2017   Klebsiella cystitis 03/14/2017   Morbid obesity ([place]) 03/13/2017   Subglottic stenosis 03/06/2017   Asthma 03/06/2017   Chronic diastolic heart failure ([place]) 03/06/2017   Tracheal stenosis 03/04/2017   Acute blood loss anemia    Generalized anxiety disorder    Chronic pain syndrome    GERD (gastroesophageal  reflux disease) 02/22/2016   Depression 02/22/2016   Migraine 01/30/2015   Bulging lumbar disc 09/05/2013   Essential hypertension 09/05/2013   Allergic rhinitis, seasonal 08/11/2012   Chronic cough 08/11/2012   OSA (obstructive sleep apnea) 12/04/2011   Past Medical History:  Diagnosis Date   Allergy    Anxiety    Arthritis    Asthma    Chronic back pain    Chronic chest pain    resolved, no problems since 2019 per patient 07/27/19   COPD (chronic obstructive pulmonary disease) ([place])     Depression    DM (diabetes mellitus) ([place])    INSULIN DEPENDENT - TYPE 1   Dyspnea    GERD (gastroesophageal reflux disease)    Headache(784.0)    History of hiatal hernia    Hyperlipidemia    no med, diet controlled   Hypertension    Hypokalemia    Pneumonia    Respiratory disease 05/2017   uses inhalers, neb tx prn, no oxygen   Sleep apnea    does not use CPAP due to trach   Tracheostomy in place [facility]) 02/2017    Family History  Problem Relation Age of Onset   Heart attack Mother    Stroke Mother    Diabetes Mother    Hypertension Mother    Arthritis Mother    Stroke Father    Asthma Sister    Hypertension Sister    Asthma Sister    Diabetes Sister    Asthma Brother    Seizures Brother    Asthma Brother    Diabetes Brother    Asthma Daughter    Asthma Son    Asthma Grandson    Colon cancer Neg Hx    Rectal cancer Neg Hx    Stomach cancer Neg Hx     Past Surgical History:  Procedure Laterality Date   ABDOMINAL HYSTERECTOMY  2009   APPENDECTOMY     CESAREAN SECTION     x 3   CHOLECYSTECTOMY N/A 03/02/2014   Procedure: LAPAROSCOPIC CHOLECYSTECTOMY;  Surgeon: Joyice Faster. Cornett, MD;  Location: [place];  Service: General;  Laterality: N/A;   HERNIA REPAIR     PANENDOSCOPY N/A 03/04/2017   Procedure: PANENDOSCOPY WITH POSSIBLE FOREIGN BODY REMOVAL;  Surgeon: Jodi Marble, MD;  Location: [place];  Service: ENT;  Laterality: N/A;   ROTATOR CUFF REPAIR Left    ROTATOR CUFF REPAIR Right 07/27/2019   SHOULDER ARTHROSCOPY WITH SUBACROMIAL DECOMPRESSION, ROTATOR CUFF REPAIR AND BICEP TENDON REPAIR Right 07/28/2019   Procedure: RIGHT SHOULDER ARTHROSCOP, MINI OPEN ROTATOR CUFF TEAR REPAIR,  BICEPS TENODESIS, DISTAL CLAVICLE EXCISION;  Surgeon: Meredith Pel, MD;  Location: [place];  Service: Orthopedics;  Laterality: Right;   TOOTH EXTRACTION N/A 02/22/2021   Procedure: DENTAL RESTORATION/EXTRACTIONS;  Surgeon: Diona Browner, DMD;  Location: [facility];  Service: Oral Surgery;   Laterality: N/A;   TRACHEOSTOMY  02/2017   TUBAL LIGATION     VESICOVAGINAL FISTULA CLOSURE W/ TAH  2009   Social History   Occupational History   Not on file  Tobacco Use   Smoking status: Former    Packs/day: 0.50    Years: 25.00    Pack years: 12.50    Types: Cigarettes    Quit date: 01/21/2016    Years since quitting: 5.7   Smokeless tobacco: Never  Vaping Use   Vaping Use: Never used  Substance and Sexual Activity   Alcohol use: Yes    Alcohol/week: 0.0 standard  drinks    Comment: social wine   Drug use: Yes    Types: Marijuana    Comment: couple times a month   Sexual activity: Yes    Partners: Male    Birth control/protection: Surgical    Comment: Hysterectomy

## 2021-10-03 NOTE — Patient Instructions (Signed)
Plan: Avoid frequent bending and stooping  No lifting greater than 10 lbs. May use ice or moist heat for pain. Weight loss is of benefit. Brace for about 6 more weeks Exercise is important to improve your indurance and does allow people to function better inspite of back pain. Okay at this point for pain management at [facility] to resume their prescriptions for pain control and to work with you to find what will work best to treat this.

## 2021-10-09 ENCOUNTER — Emergency Department (HOSPITAL_COMMUNITY)
Admission: EM | Admit: 2021-10-09 | Discharge: 2021-10-09 | Disposition: A | Discharge status: Home / Self Care | Payer: 59 | Attending: Emergency Medicine | Admitting: Emergency Medicine

## 2021-10-09 ENCOUNTER — Other Ambulatory Visit: Payer: Self-pay

## 2021-10-09 DIAGNOSIS — G8929 Other chronic pain: Secondary | ICD-10-CM | POA: Insufficient documentation

## 2021-10-09 DIAGNOSIS — Z794 Long term (current) use of insulin: Secondary | ICD-10-CM | POA: Insufficient documentation

## 2021-10-09 DIAGNOSIS — Z7982 Long term (current) use of aspirin: Secondary | ICD-10-CM | POA: Diagnosis not present

## 2021-10-09 DIAGNOSIS — E119 Type 2 diabetes mellitus without complications: Secondary | ICD-10-CM | POA: Diagnosis not present

## 2021-10-09 DIAGNOSIS — R1013 Epigastric pain: Secondary | ICD-10-CM | POA: Insufficient documentation

## 2021-10-09 LAB — DIFFERENTIAL
Abs Immature Granulocytes: 0.04 10*3/uL (ref 0.00–0.07)
Basophils Absolute: 0 10*3/uL (ref 0.0–0.1)
Basophils Relative: 0 %
Eosinophils Absolute: 0 10*3/uL (ref 0.0–0.5)
Eosinophils Relative: 0 %
Immature Granulocytes: 0 %
Lymphocytes Relative: 35 %
Lymphs Abs: 4.2 10*3/uL — ABNORMAL HIGH (ref 0.7–4.0)
Monocytes Absolute: 0.6 10*3/uL (ref 0.1–1.0)
Monocytes Relative: 5 %
Neutro Abs: 7.1 10*3/uL (ref 1.7–7.7)
Neutrophils Relative %: 60 %

## 2021-10-09 LAB — CBC
HCT: 36.6 % (ref 36.0–46.0)
Hemoglobin: 11.6 g/dL — ABNORMAL LOW (ref 12.0–15.0)
MCH: 27.1 pg (ref 26.0–34.0)
MCHC: 31.7 g/dL (ref 30.0–36.0)
MCV: 85.5 fL (ref 80.0–100.0)
Platelets: 414 10*3/uL — ABNORMAL HIGH (ref 150–400)
RBC: 4.28 MIL/uL (ref 3.87–5.11)
RDW: 14 % (ref 11.5–15.5)
WBC: 12 10*3/uL — ABNORMAL HIGH (ref 4.0–10.5)
nRBC: 0 % (ref 0.0–0.2)

## 2021-10-09 LAB — COMPREHENSIVE METABOLIC PANEL
ALT: 16 U/L (ref 0–44)
AST: 16 U/L (ref 15–41)
Albumin: 3.7 g/dL (ref 3.5–5.0)
Alkaline Phosphatase: 119 U/L (ref 38–126)
Anion gap: 12 (ref 5–15)
BUN: 14 mg/dL (ref 6–20)
CO2: 21 mmol/L — ABNORMAL LOW (ref 22–32)
Calcium: 9.5 mg/dL (ref 8.9–10.3)
Chloride: 103 mmol/L (ref 98–111)
Creatinine, Ser: 1.22 mg/dL — ABNORMAL HIGH (ref 0.44–1.00)
GFR, Estimated: 53 mL/min — ABNORMAL LOW (ref >60)
Glucose, Bld: 151 mg/dL — ABNORMAL HIGH (ref 70–99)
Potassium: 3.9 mmol/L (ref 3.5–5.1)
Sodium: 136 mmol/L (ref 135–145)
Total Bilirubin: 0.6 mg/dL (ref 0.3–1.2)
Total Protein: 8.3 g/dL — ABNORMAL HIGH (ref 6.5–8.1)

## 2021-10-09 LAB — LIPASE, BLOOD: Lipase: 30 U/L (ref 11–51)

## 2021-10-09 MED ORDER — HYDROMORPHONE HCL 1 MG/ML IJ SOLN
1.0000 mg | Freq: Once | INTRAMUSCULAR | Status: AC
  Administered 2021-10-09: 1 mg via INTRAVENOUS
  Filled 2021-10-09: qty 1

## 2021-10-09 MED ORDER — HALOPERIDOL LACTATE 5 MG/ML IJ SOLN
2.0000 mg | Freq: Once | INTRAMUSCULAR | Status: AC
  Administered 2021-10-09: 2 mg via INTRAVENOUS
  Filled 2021-10-09: qty 1

## 2021-10-09 MED ORDER — SODIUM CHLORIDE 0.9 % IV BOLUS
500.0000 mL | Freq: Once | INTRAVENOUS | Status: AC
  Administered 2021-10-09: 500 mL via INTRAVENOUS

## 2021-10-09 MED ORDER — ONDANSETRON HCL 4 MG/2ML IJ SOLN
4.0000 mg | Freq: Once | INTRAMUSCULAR | Status: AC
  Administered 2021-10-09: 4 mg via INTRAVENOUS

## 2021-10-09 MED ORDER — ONDANSETRON HCL 4 MG/2ML IJ SOLN
INTRAMUSCULAR | Status: AC
  Filled 2021-10-09: qty 2

## 2021-10-09 NOTE — ED Notes (Signed)
PA Bowie at bedside

## 2021-10-09 NOTE — ED Notes (Signed)
Discharge instructions discussed with pt. Pt verbalized understanding with no questions at this time. Pt to go home with daughter.

## 2021-10-09 NOTE — Discharge Instructions (Addendum)
Please call your gastroenterologist back about these episodes of nausea and vomiting.  This may be related to an issue with your stomach.  You should follow-up with the GI.

## 2021-10-09 NOTE — ED Provider Notes (Signed)
[facility] EMERGENCY DEPARTMENT Provider Note   CSN: [id number] Arrival date & time: 10/09/21  2040     History  Chief Complaint  Patient presents with   Abdominal Pain    [name] is a 51 y.o. female with a history of tracheostomy, chronic abdominal pain, presented ED with complaint of abdominal pain.  The patient arrives by EMS complaint of severe epigastric abdominal pain for the past few hours.  She has a known history of chronic abdominal pain with eating, was seen by gastroenterology for this issue last year, and had an endoscopy performed in April 2022 which was largely unremarkable.  There was a question as to whether she may be suffering from gastroparesis as she is a diabetic.  Her symptoms seem to come and go.  Her pain is currently 10 out of 10 and severe.  She has several episodes of dry heaving.  She says it feels like her prior episodes.  Of note, the patient also has a history of an ascending thoracic aneurysm measuring 4.1 cm on CT of the chest abdomen pelvis measured in July 2022.  She has a history of a cholecystectomy. She also has a history of a partial hysterectomy.  HPI     Home Medications Prior to Admission medications   Medication Sig Start Date End Date Taking? Authorizing Provider  albuterol (PROAIR HFA) 108 (90 Base) MCG/ACT inhaler Inhale 2 puffs into the lungs every 6 (six) hours as needed for wheezing or shortness of breath. 09/24/21   [name], MD  albuterol (PROVENTIL) (2.5 MG/3ML) 0.083% nebulizer solution Take 3 mLs (2.5 mg total) by nebulization every 6 (six) hours as needed for wheezing or shortness of breath. 09/24/21   [name], MD  amoxicillin-clavulanate (AUGMENTIN) 875-125 MG tablet Take 1 tablet by mouth every 12 (twelve) hours. 08/26/21   Jessy Oto, MD  aspirin EC 81 MG EC tablet Take 1 tablet (81 mg total) by mouth daily. Swallow whole. 08/27/21   Jessy Oto, MD  atorvastatin  (LIPITOR) 20 MG tablet Take 1 tablet (20 mg total) by mouth daily. 09/24/21   [name], MD  Blood Glucose Monitoring Suppl (ONETOUCH VERIO) w/Device KIT 1 each by Does not apply route daily. Use to monitor glucose levels daily; E11.9 01/25/21   Renato Shin, MD  buPROPion [facility] SR) 150 MG 12 hr tablet Take 1 tablet (150 mg total) by mouth 2 (two) times daily. 09/24/21   [name], MD  cetirizine (ZYRTEC) 10 MG tablet Take 1 tablet (10 mg total) by mouth at bedtime. 03/06/21   Spero Geralds, MD  dapagliflozin propanediol (FARXIGA) 5 MG TABS tablet Take 1 tablet (5 mg total) by mouth daily before breakfast. 11/30/20   [name], MD  diclofenac Sodium (VOLTAREN) 1 % GEL Apply 4 g topically 4 (four) times daily. Patient not taking: Reported on 08/09/2021 11/27/20   [name], MD  docusate sodium (COLACE) 100 MG capsule Take 1 capsule (100 mg total) by mouth 2 (two) times daily. 08/26/21   Jessy Oto, MD  furosemide (LASIX) 20 MG tablet Take 1 tablet (20 mg total) by mouth daily. Patient taking differently: Take 20 mg by mouth daily as needed for fluid or edema. 03/31/19   [name], MD  gabapentin (NEURONTIN) 300 MG capsule Take 2 capsules (600 mg total) by mouth 2 (two) times daily. 09/24/21   [name], MD  glucose blood (ONETOUCH VERIO) test strip Use to monitor glucose levels  daily; E11.9 10/04/20   Renato Shin, MD  hydrOXYzine (ATARAX/VISTARIL) 25 MG tablet Take 1 tablet (25 mg total) by mouth 3 (three) times daily as needed for anxiety. 11/27/20   [name], MD  insulin degludec (TRESIBA FLEXTOUCH) 200 UNIT/ML FlexTouch Pen Inject 156 Units into the skin daily. Patient taking differently: Inject 155 Units into the skin at bedtime. 07/26/21   Renato Shin, MD  Insulin Pen Needle 31G X 8 MM MISC Inject 1 Units as directed daily. 07/30/18   [provider]  lisinopril (ZESTRIL) 20 MG tablet Take 1 tablet (20 mg total) by mouth daily. 09/24/21   [name], MD  Melatonin 3 MG TABS Take 3 mg by mouth at bedtime. 10/31/17   [provider]  methocarbamol (ROBAXIN) 750 MG tablet Take 1 tablet (750 mg total) by mouth every 8 (eight) hours as needed for muscle spasms. 09/17/21   [name], MD  Misc. Devices MISC Nebulizer device. Diagnosis - Asthma 07/16/20   [name], MD  Misc. Devices [employer].  Diagnosis-low back pain 01/23/21   [name], MD  Misc. Devices [place] chair 04/03/21   [name], MD  Misc. Devices MISC Bedside commode. Diagnosis- low back pain 09/02/21   [name], MD  mometasone-formoterol (DULERA) 200-5 MCG/ACT AERO Inhale 2 puffs into the lungs in the morning and at bedtime. 07/11/20   [name], MD  ondansetron (ZOFRAN) 4 MG tablet TAKE 1 TABLET BY MOUTH EVERY 8 HOURS AS NEEDED FOR NAUSEA OR VOMITING 07/16/21   [name], MD  OneTouch Delica Lancets 16O MISC 1 each by Does not apply route daily. Use to monitor glucose levels daily; E11.9 04/14/19   Renato Shin, MD  pantoprazole (PROTONIX) 40 MG tablet Take 1 tablet (40 mg total) by mouth 2 (two) times daily. Patient taking differently: Take 40 mg by mouth daily as needed (heartburn). 04/30/21   [name], MD  polyethylene glycol (MIRALAX / GLYCOLAX) 17 g packet Take 17 g by mouth daily as needed for mild constipation. 08/26/21   Jessy Oto, MD  Semaglutide,0.25 or 0.5MG/DOS, (OZEMPIC, 0.25 OR 0.5 MG/DOSE,) 2 MG/1.5ML SOPN Inject 0.5 mg into the skin once a week. 04/10/21   Renato Shin, MD  sertraline (ZOLOFT) 100 MG tablet Take 1 tablet (100 mg total) by mouth at bedtime. 09/24/21   [name], MD  silver sulfADIAZINE (SILVADENE) 1 % cream Apply 1 application topically daily. Patient not taking: Reported on 08/09/2021 02/28/21   [name], MD  sucralfate (CARAFATE) 1 g tablet Take 1 tablet (1 g total) by mouth 4 (four) times daily -  with meals and at bedtime. Patient taking differently: Take 1 g by mouth at  bedtime. 11/28/20   Zehr, Laban Emperor, PA-C  SUMAtriptan (IMITREX) 100 MG tablet At the onset of a migraine; may repeat in 2 hours. Max daily dose 237m 09/24/21   NCharlott Rakes MD  topiramate (TOPAMAX) 100 MG tablet Take 1.5 tablets (150 mg total) by mouth 2 (two) times daily. 09/24/21   NCharlott Rakes MD  fluticasone-salmeterol (ADVAIR HFA) 230-21 MCG/ACT inhaler Inhale 2 puffs into the lungs 2 (two) times daily. 10/15/17 10/15/17  NCharlott Rakes MD      Allergies    Reglan [metoclopramide]    Review of Systems   Review of Systems  Physical Exam Updated Vital Signs BP (!) 139/92    Pulse (!) 102    Temp (!) 97.5 F (36.4 C)    Resp 19  LMP  (LMP Unknown)    SpO2 96%  Physical Exam Constitutional:      General: She is in acute distress.     Appearance: She is obese.  HENT:     Head: Normocephalic and atraumatic.  Eyes:     Conjunctiva/sclera: Conjunctivae normal.     Pupils: Pupils are equal, round, and reactive to light.  Cardiovascular:     Rate and Rhythm: Normal rate and regular rhythm.  Pulmonary:     Effort: Pulmonary effort is normal. No respiratory distress.  Abdominal:     General: There is no distension.     Tenderness: There is abdominal tenderness in the epigastric area. There is guarding.  Skin:    General: Skin is warm and dry.  Neurological:     General: No focal deficit present.     Mental Status: She is alert. Mental status is at baseline.  Psychiatric:        Mood and Affect: Mood normal.        Behavior: Behavior normal.    ED Results / Procedures / Treatments   Labs (all labs ordered are listed, but only abnormal results are displayed) Labs Reviewed  COMPREHENSIVE METABOLIC PANEL - Abnormal; Notable for the following components:      Result Value   CO2 21 (*)    Glucose, Bld 151 (*)    Creatinine, Ser 1.22 (*)    Total Protein 8.3 (*)    GFR, Estimated 53 (*)    All other components within normal limits  CBC - Abnormal; Notable for the  following components:   WBC 12.0 (*)    Hemoglobin 11.6 (*)    Platelets 414 (*)    All other components within normal limits  DIFFERENTIAL - Abnormal; Notable for the following components:   Lymphs Abs 4.2 (*)    All other components within normal limits  LIPASE, BLOOD    EKG EKG Interpretation  Date/Time:  Wednesday October 09 2021 20:48:56 EST Ventricular Rate:  99 PR Interval:  170 QRS Duration: 86 QT Interval:  376 QTC Calculation: 483 R Axis:   6 Text Interpretation: Sinus rhythm Baseline wander in lead(s) I II aVR aVF V1 V3 Confirmed by Octaviano Glow [phone]) on 10/09/2021 9:38:26 PM  Radiology No results found.  Procedures Procedures    Medications Ordered in ED Medications  ondansetron (ZOFRAN) injection 4 mg (4 mg Intravenous Given 10/09/21 2100)  HYDROmorphone (DILAUDID) injection 1 mg (1 mg Intravenous Given 10/09/21 2100)  haloperidol lactate (HALDOL) injection 2 mg (2 mg Intravenous Given 10/09/21 2117)  sodium chloride 0.9 % bolus 500 mL (0 mLs Intravenous Stopped 10/09/21 2207)    ED Course/ Medical Decision Making/ A&P Clinical Course as of 10/10/21 1312  Wed Oct 09, 2021  2240 Pain is improved to 2 out of 10 and nausea has significantly improved [MT]  2316 Patient has had near complete resolution of her abdominal pain, she is tolerating p.o. and requesting to go home.  I do think is reasonable discharge her now with GI follow-up, suspect this is her chronic gastric pain. [MT]    Clinical Course User Index [MT] Caia Lofaro, Carola Rhine, MD                           Medical Decision Making Amount and/or Complexity of Data Reviewed Labs: ordered.  Risk Prescription drug management.   This patient presents to the Emergency Department with complaint of abdominal pain.  This involves an extensive number of treatment options, and is a complaint that carries with it a high risk of complications and morbidity.  The differential diagnosis includes, but is not  limited to, gastritis versus gastroparesis vs peptic ulcer vs constipation vs colitis vs UTI vs other  She does have a known history of a thoracic aneurysm, which was only mildly enlarged at 4.1 cm 6 months ago.  Given that her own description of her pain is consistent with GI and gastroparesis, as she has recurring episodes exactly like this, I have a lower suspicion at this time for aortic dissection.  I think is reasonable to treat for gastritis first, evaluate her labs and see how her pain is doing afterwards.  I ordered, reviewed, and interpreted labs, including BMP and CBC.  There were no immediate, life-threatening emergencies found in this labwork.  She has a minor leukocytosis throughout suspect is reactive from vomiting.  Lipase within normal limits.  Creatinine with a very minor elevation 1.2, with baseline near 1.1, but no evidence of AKI.  No anion gap acidosis.  Doubt DKA. I ordered medication IV Dilaudid, IV Haldol, IV Zofran, IV fluid for abdominal pain and/or nausea Patient's external records were reviewed including gastroenterology office evaluation and EGD from 2022 I personally reviewed the patients ECG which showed sinus rhythm with no acute ischemic findings  After the interventions stated above, I reevaluated the patient and found that they remained clinically stable.  She was symptomatically improved and tolerating p.o. fluids.  Based on the patient's clinical exam, vital signs, risk factors, and ED testing, I felt that the patient's overall risk of life-threatening emergency such as bowel perforation, surgical emergency, or sepsis was quite low.  I suspect this clinical presentation is most consistent with chronic gastroparesis or GI upset, but explained to the patient that this evaluation was not a definitive diagnostic workup.  I encouraged the patient follow-up with gastroenterology again.  Given that she was tolerating fluids at this time, and felt significantly better, I  felt she was reasonably safe for discharge.  I have a lower suspicion for aortic dissection, mesenteric ischemia, or other life-threatening emergency at this time.        Final Clinical Impression(s) / ED Diagnoses Final diagnoses:  Epigastric pain    Rx / DC Orders ED Discharge Orders     None         Wyvonnia Dusky, MD 10/10/21 1312

## 2021-10-09 NOTE — ED Notes (Signed)
Pt denies nausea. Described pain 2/10. EDP Trifan updated.

## 2021-10-09 NOTE — ED Triage Notes (Signed)
EMS report pt c/o abdominal pain acute onset today worsening over past few hours. Pt reported vomiting earlier, no emesis for EMS. EMS VS CBG 165, HR 110, SBP 152. Pt tearful. HR 107. Trach in place with secretions.

## 2021-10-11 ENCOUNTER — Emergency Department (HOSPITAL_COMMUNITY): Payer: 59

## 2021-10-11 ENCOUNTER — Other Ambulatory Visit: Payer: Self-pay

## 2021-10-11 ENCOUNTER — Emergency Department (HOSPITAL_COMMUNITY)
Admission: EM | Admit: 2021-10-11 | Discharge: 2021-10-11 | Disposition: A | Discharge status: Home / Self Care | Payer: 59 | Attending: Emergency Medicine | Admitting: Emergency Medicine

## 2021-10-11 ENCOUNTER — Emergency Department (HOSPITAL_COMMUNITY)
Admission: EM | Admit: 2021-10-11 | Discharge: 2021-10-11 | Disposition: A | Discharge status: Home / Self Care | Payer: 59 | Source: Home / Self Care | Attending: Emergency Medicine | Admitting: Emergency Medicine

## 2021-10-11 ENCOUNTER — Encounter (HOSPITAL_COMMUNITY): Payer: Self-pay

## 2021-10-11 DIAGNOSIS — E119 Type 2 diabetes mellitus without complications: Secondary | ICD-10-CM | POA: Insufficient documentation

## 2021-10-11 DIAGNOSIS — K3184 Gastroparesis: Secondary | ICD-10-CM | POA: Insufficient documentation

## 2021-10-11 DIAGNOSIS — Z794 Long term (current) use of insulin: Secondary | ICD-10-CM | POA: Insufficient documentation

## 2021-10-11 DIAGNOSIS — R112 Nausea with vomiting, unspecified: Secondary | ICD-10-CM | POA: Diagnosis not present

## 2021-10-11 DIAGNOSIS — R1013 Epigastric pain: Secondary | ICD-10-CM | POA: Insufficient documentation

## 2021-10-11 DIAGNOSIS — Z79899 Other long term (current) drug therapy: Secondary | ICD-10-CM | POA: Insufficient documentation

## 2021-10-11 DIAGNOSIS — Z7982 Long term (current) use of aspirin: Secondary | ICD-10-CM | POA: Insufficient documentation

## 2021-10-11 LAB — CBC WITH DIFFERENTIAL/PLATELET
Abs Immature Granulocytes: 0.04 10*3/uL (ref 0.00–0.07)
Basophils Absolute: 0 10*3/uL (ref 0.0–0.1)
Basophils Relative: 0 %
Eosinophils Absolute: 0 10*3/uL (ref 0.0–0.5)
Eosinophils Relative: 0 %
HCT: 35.7 % — ABNORMAL LOW (ref 36.0–46.0)
Hemoglobin: 11.3 g/dL — ABNORMAL LOW (ref 12.0–15.0)
Immature Granulocytes: 0 %
Lymphocytes Relative: 28 %
Lymphs Abs: 3.2 10*3/uL (ref 0.7–4.0)
MCH: 26.6 pg (ref 26.0–34.0)
MCHC: 31.7 g/dL (ref 30.0–36.0)
MCV: 84 fL (ref 80.0–100.0)
Monocytes Absolute: 0.4 10*3/uL (ref 0.1–1.0)
Monocytes Relative: 3 %
Neutro Abs: 8.1 10*3/uL — ABNORMAL HIGH (ref 1.7–7.7)
Neutrophils Relative %: 69 %
Platelets: 417 10*3/uL — ABNORMAL HIGH (ref 150–400)
RBC: 4.25 MIL/uL (ref 3.87–5.11)
RDW: 14.1 % (ref 11.5–15.5)
WBC: 11.7 10*3/uL — ABNORMAL HIGH (ref 4.0–10.5)
nRBC: 0 % (ref 0.0–0.2)

## 2021-10-11 LAB — COMPREHENSIVE METABOLIC PANEL
ALT: 24 U/L (ref 0–44)
AST: 23 U/L (ref 15–41)
Albumin: 3.8 g/dL (ref 3.5–5.0)
Alkaline Phosphatase: 121 U/L (ref 38–126)
Anion gap: 13 (ref 5–15)
BUN: 12 mg/dL (ref 6–20)
CO2: 22 mmol/L (ref 22–32)
Calcium: 9.5 mg/dL (ref 8.9–10.3)
Chloride: 101 mmol/L (ref 98–111)
Creatinine, Ser: 1.17 mg/dL — ABNORMAL HIGH (ref 0.44–1.00)
GFR, Estimated: 56 mL/min — ABNORMAL LOW (ref >60)
Glucose, Bld: 202 mg/dL — ABNORMAL HIGH (ref 70–99)
Potassium: 3.5 mmol/L (ref 3.5–5.1)
Sodium: 136 mmol/L (ref 135–145)
Total Bilirubin: 0.7 mg/dL (ref 0.3–1.2)
Total Protein: 8.5 g/dL — ABNORMAL HIGH (ref 6.5–8.1)

## 2021-10-11 LAB — LIPASE, BLOOD: Lipase: 30 U/L (ref 11–51)

## 2021-10-11 MED ORDER — ONDANSETRON HCL 4 MG/2ML IJ SOLN
4.0000 mg | Freq: Once | INTRAMUSCULAR | Status: AC
  Administered 2021-10-11: 4 mg via INTRAVENOUS
  Filled 2021-10-11: qty 2

## 2021-10-11 MED ORDER — IOHEXOL 300 MG/ML  SOLN
100.0000 mL | Freq: Once | INTRAMUSCULAR | Status: AC | PRN
  Administered 2021-10-11: 100 mL via INTRAVENOUS

## 2021-10-11 MED ORDER — HYDROMORPHONE HCL 1 MG/ML IJ SOLN
1.0000 mg | Freq: Once | INTRAMUSCULAR | Status: AC
  Administered 2021-10-11: 1 mg via INTRAVENOUS
  Filled 2021-10-11: qty 1

## 2021-10-11 MED ORDER — HALOPERIDOL LACTATE 5 MG/ML IJ SOLN
2.0000 mg | Freq: Once | INTRAMUSCULAR | Status: AC
Start: 2021-10-11 — End: 2021-10-11
  Administered 2021-10-11: 2 mg via INTRAVENOUS
  Filled 2021-10-11: qty 1

## 2021-10-11 MED ORDER — PANTOPRAZOLE SODIUM 20 MG PO TBEC: 20.0000 mg | DELAYED_RELEASE_TABLET | Freq: Every day | ORAL | 0 refills | Status: DC

## 2021-10-11 MED ORDER — SODIUM CHLORIDE (PF) 0.9 % IJ SOLN
INTRAMUSCULAR | Status: AC
  Filled 2021-10-11: qty 50

## 2021-10-11 MED ORDER — DICYCLOMINE HCL 20 MG PO TABS: 20.0000 mg | ORAL_TABLET | Freq: Two times a day (BID) | ORAL | 0 refills | Status: DC

## 2021-10-11 NOTE — Discharge Instructions (Addendum)
Please use Tylenol or ibuprofen for pain.  You may use 600 mg ibuprofen every 6 hours or 1000 mg of Tylenol every 6 hours.  You may choose to alternate between the 2.  This would be most effective.  Not to exceed 4 g of Tylenol within 24 hours.  Not to exceed 3200 mg ibuprofen 24 hours.  I prescribed you 2 medications, 1 is for acid reflux, Protonix, another for stomach spasms, Bentyl.  Continue to monitor your sugar, try to keep your diabetes under control.  As I believe that this may be related to difficulties with your stomach emptying, I recommend that you eat smaller more frequent meals instead of large meals.  Please follow-up with your gastroenterologist.  Please return if your pain worsens or fails to improve.

## 2021-10-11 NOTE — ED Provider Triage Note (Signed)
Emergency Medicine Provider Triage Evaluation Note  [name] , a 51 y.o. female  was evaluated in triage.  Pt complains of epigastric abdominal pain.  Hx of same, ongoing for a while now and following with GI for same.  Seen yesterday for same with reassuring work-up.  Reports continued vomiting.  No fever/chills.  Review of Systems  Positive: Abdominal pain Negative: fever  Physical Exam  BP (!) 162/98    Pulse (!) 112    Temp 99.1 F (37.3 C) (Oral)    Resp 17    Ht 5\' 1"  (1.549 m)    Wt 104.3 kg    LMP  (LMP Unknown)    SpO2 95%    BMI 43.46 kg/m   Gen:   Awake, no distress   Resp:  Normal effort  MSK:   Moves extremities without difficulty  Other:  Trach in place, chronic  Medical Decision Making  Medically screening exam initiated at 1:28 AM.  Appropriate orders placed.  [name] was informed that the remainder of the evaluation will be completed by another provider, this initial triage assessment does not replace that evaluation, and the importance of remaining in the ED until their evaluation is complete.  Abdominal pain.  Work-up from yesterday overall reassuring.  Will repeat labs, CT scan.   Larene Pickett, PA-C 10/11/21 [phone]

## 2021-10-11 NOTE — ED Triage Notes (Signed)
Pt arrives EMS from home with c/o generalized abdominal pain since yesterday. Pt eloped from [place] prior to visit. Admits to nausea and vomiting since onset of abd pain. Trach in place.

## 2021-10-11 NOTE — ED Notes (Signed)
Patient transported to CT 

## 2021-10-11 NOTE — ED Triage Notes (Signed)
Pt presents to the ED from home via [facility] with complaints of abd pain onset yesterday. Pt was seen, treated, and D/C from here with epigastric pain. Pt also having N/V. Pt has trach in place, no secretions noted. Pt given Zofran 4mg  IV by EMS.

## 2021-10-11 NOTE — ED Provider Notes (Signed)
[facility] DEPT Provider Note   CSN: [id number] Arrival date & time: 10/11/21  3614     History  Chief Complaint  Patient presents with   Abdominal Pain    [name] is a 51 y.o. female with past medical history of tracheostomy, chronic abdominal pain who presents with ongoing upper abdominal pain.  She was just seen and discharged from [facility] last night, known history of chronic abdominal pain with eating.  Has been seen by gastroenterology, endoscopy performed in April 2022.  Patient does have diabetes, some question of ongoing gastroparesis.  Patient arrived with pain 10/10.  She reports that her pain began around 2 hours after she was discharged.  Some dry heaving, reports she feels similar to previous.  She has had a history of ascending thoracic aneurysm in July 2022.  History of cholecystectomy, history of partial hysterectomy.  Took oxycodone PTA 12 AM.  Denies chest pain, dysuria, frequency, hematuria.  Denies vaginal discharge, dyspareunia.  Denies constipation, diarrhea.   Abdominal Pain     Home Medications Prior to Admission medications   Medication Sig Start Date End Date Taking? Authorizing Provider  albuterol (PROAIR HFA) 108 (90 Base) MCG/ACT inhaler Inhale 2 puffs into the lungs every 6 (six) hours as needed for wheezing or shortness of breath. 09/24/21  Yes [name], MD  albuterol (PROVENTIL) (2.5 MG/3ML) 0.083% nebulizer solution Take 3 mLs (2.5 mg total) by nebulization every 6 (six) hours as needed for wheezing or shortness of breath. 09/24/21  Yes [name], MD  aspirin EC 81 MG EC tablet Take 1 tablet (81 mg total) by mouth daily. Swallow whole. 08/27/21  Yes Jessy Oto, MD  atorvastatin (LIPITOR) 20 MG tablet Take 1 tablet (20 mg total) by mouth daily. 09/24/21  Yes [name], MD  Blood Glucose Monitoring Suppl (ONETOUCH VERIO) w/Device KIT 1 each by Does not apply route daily. Use to monitor  glucose levels daily; E11.9 01/25/21  Yes Renato Shin, MD  buPROPion [facility] SR) 150 MG 12 hr tablet Take 1 tablet (150 mg total) by mouth 2 (two) times daily. 09/24/21  Yes [name], MD  cetirizine (ZYRTEC) 10 MG tablet Take 1 tablet (10 mg total) by mouth at bedtime. 03/06/21  Yes Spero Geralds, MD  dapagliflozin propanediol (FARXIGA) 5 MG TABS tablet Take 1 tablet (5 mg total) by mouth daily before breakfast. 11/30/20  Yes Newlin, Enobong, MD  diclofenac Sodium (VOLTAREN) 1 % GEL Apply 4 g topically 4 (four) times daily. 11/27/20  Yes [name], MD  dicyclomine (BENTYL) 20 MG tablet Take 1 tablet (20 mg total) by mouth 2 (two) times daily. 10/11/21  Yes Winona Sison H, PA-C  docusate sodium (COLACE) 100 MG capsule Take 1 capsule (100 mg total) by mouth 2 (two) times daily. 08/26/21  Yes Jessy Oto, MD  furosemide (LASIX) 20 MG tablet Take 1 tablet (20 mg total) by mouth daily. Patient taking differently: Take 20 mg by mouth daily as needed for fluid or edema. 03/31/19  Yes [name], MD  glucose blood (ONETOUCH VERIO) test strip Use to monitor glucose levels daily; E11.9 10/04/20  Yes Renato Shin, MD  hydrOXYzine (ATARAX/VISTARIL) 25 MG tablet Take 1 tablet (25 mg total) by mouth 3 (three) times daily as needed for anxiety. 11/27/20  Yes Newlin, Charlane Ferretti, MD  insulin degludec (TRESIBA FLEXTOUCH) 200 UNIT/ML FlexTouch Pen Inject 156 Units into the skin daily. Patient taking differently: Inject 155 Units into the skin  at bedtime. 07/26/21  Yes Renato Shin, MD  Insulin Pen Needle 31G X 8 MM MISC Inject 1 Units as directed daily. 07/30/18  Yes [provider]  lisinopril (ZESTRIL) 20 MG tablet Take 1 tablet (20 mg total) by mouth daily. 09/24/21  Yes [name], MD  Melatonin 3 MG TABS Take 3 mg by mouth at bedtime. 10/31/17  Yes [provider]  methocarbamol (ROBAXIN) 750 MG tablet Take 1 tablet (750 mg total) by mouth every 8 (eight) hours as needed for  muscle spasms. 09/17/21  Yes [name], MD  Misc. Devices MISC Nebulizer device. Diagnosis - Asthma 07/16/20  Yes [name], MD  Misc. Devices [employer].  Diagnosis-low back pain 01/23/21  Yes [name], MD  Misc. Devices [place] chair 04/03/21  Yes [name], MD  Misc. Devices MISC Bedside commode. Diagnosis- low back pain 09/02/21  Yes Newlin, Enobong, MD  mometasone-formoterol (DULERA) 200-5 MCG/ACT AERO Inhale 2 puffs into the lungs in the morning and at bedtime. 07/11/20  Yes Newlin, Enobong, MD  ondansetron (ZOFRAN) 4 MG tablet TAKE 1 TABLET BY MOUTH EVERY 8 HOURS AS NEEDED FOR NAUSEA OR VOMITING 07/16/21  Yes [name], MD  OneTouch Delica Lancets 93O MISC 1 each by Does not apply route daily. Use to monitor glucose levels daily; E11.9 04/14/19  Yes Renato Shin, MD  pantoprazole (PROTONIX) 20 MG tablet Take 1 tablet (20 mg total) by mouth daily. 10/11/21  Yes Ia Leeb H, PA-C  pantoprazole (PROTONIX) 40 MG tablet Take 1 tablet (40 mg total) by mouth 2 (two) times daily. Patient taking differently: Take 40 mg by mouth daily as needed (heartburn). 04/30/21  Yes [name], MD  polyethylene glycol (MIRALAX / GLYCOLAX) 17 g packet Take 17 g by mouth daily as needed for mild constipation. 08/26/21  Yes Jessy Oto, MD  Semaglutide,0.25 or 0.5MG/DOS, (OZEMPIC, 0.25 OR 0.5 MG/DOSE,) 2 MG/1.5ML SOPN Inject 0.5 mg into the skin once a week. 04/10/21  Yes Renato Shin, MD  sertraline (ZOLOFT) 100 MG tablet Take 1 tablet (100 mg total) by mouth at bedtime. 09/24/21  Yes [name], MD  silver sulfADIAZINE (SILVADENE) 1 % cream Apply 1 application topically daily. 02/28/21  Yes [name], MD  sucralfate (CARAFATE) 1 g tablet Take 1 tablet (1 g total) by mouth 4 (four) times daily -  with meals and at bedtime. Patient taking differently: Take 1 g by mouth at bedtime. 11/28/20  Yes Zehr, Laban Emperor, PA-C  SUMAtriptan (IMITREX) 100 MG tablet At the onset of  a migraine; may repeat in 2 hours. Max daily dose 247m 09/24/21  Yes Newlin, Enobong, MD  topiramate (TOPAMAX) 100 MG tablet Take 1.5 tablets (150 mg total) by mouth 2 (two) times daily. 09/24/21  Yes NCharlott Rakes MD  gabapentin (NEURONTIN) 300 MG capsule Take 2 capsules (600 mg total) by mouth 2 (two) times daily. 09/24/21   NCharlott Rakes MD  fluticasone-salmeterol (ADVAIR HFA) 230-21 MCG/ACT inhaler Inhale 2 puffs into the lungs 2 (two) times daily. 10/15/17 10/15/17  NCharlott Rakes MD      Allergies    Reglan [metoclopramide]    Review of Systems   Review of Systems  Gastrointestinal:  Positive for abdominal pain.  All other systems reviewed and are negative.  Physical Exam Updated Vital Signs BP (!) 144/107    Pulse (!) 105    Temp 99.2 F (37.3 C) (Oral)    Resp 18    LMP  (LMP Unknown)  SpO2 98%  Physical Exam Vitals and nursing note reviewed.  Constitutional:      General: She is in acute distress.     Appearance: Normal appearance. She is ill-appearing.     Comments: Somewhat ill-appearing patient, in some distress.  HENT:     Head: Normocephalic and atraumatic.  Eyes:     General:        Right eye: No discharge.        Left eye: No discharge.  Cardiovascular:     Rate and Rhythm: Normal rate and regular rhythm.     Heart sounds: No murmur heard.   No friction rub. No gallop.  Pulmonary:     Effort: Pulmonary effort is normal.     Breath sounds: Normal breath sounds.  Abdominal:     General: Bowel sounds are normal.     Palpations: Abdomen is soft.     Comments: TTP epigastric. No rebound, rigidity, guarding.  Skin:    General: Skin is warm and dry.     Capillary Refill: Capillary refill takes less than 2 seconds.  Neurological:     Mental Status: She is alert and oriented to person, place, and time.  Psychiatric:        Mood and Affect: Mood normal.        Behavior: Behavior normal.    ED Results / Procedures / Treatments   Labs (all labs ordered are  listed, but only abnormal results are displayed) Labs Reviewed - No data to display  EKG None  Radiology CT ABDOMEN PELVIS W CONTRAST  Result Date: 10/11/2021 CLINICAL DATA:  Abdominal pain. Nausea and vomiting with elevated WBCs. EXAM: CT ABDOMEN AND PELVIS WITH CONTRAST TECHNIQUE: Multidetector CT imaging of the abdomen and pelvis was performed using the standard protocol following bolus administration of intravenous contrast. RADIATION DOSE REDUCTION: This exam was performed according to the departmental dose-optimization program which includes automated exposure control, adjustment of the mA and/or kV according to patient size and/or use of iterative reconstruction technique. CONTRAST:  155m OMNIPAQUE IOHEXOL 300 MG/ML  SOLN COMPARISON:  04/01/2021 FINDINGS: Lower chest: No acute abnormality. Hepatobiliary: Diffuse low attenuation throughout the liver compatible with hepatic steatosis. No focal liver abnormality. Cholecystectomy. CBD measures 7 mm. No intrahepatic bile duct dilatation Pancreas: Unremarkable. No pancreatic ductal dilatation or surrounding inflammatory changes. Spleen: Normal in size without focal abnormality. Adrenals/Urinary Tract: Normal adrenal glands. The kidneys appear normal. No nephrolithiasis, hydronephrosis or mass. Diverticulosis noted arising off the anterior dome of bladder measuring 1.7 cm. Stomach/Bowel: Stomach is nondistended. Status post appendectomy. No bowel wall thickening, inflammation, or distension. Vascular/Lymphatic: No enlarged abdominopelvic lymph nodes. No significant vascular abnormality. Reproductive: Status post hysterectomy. Prominent left ovary is again noted measuring 3.9 x 2.3 by 2.8 cm (volume = 13 cm^3). Normal appearance of the right ovary. Other: No ascites or focal fluid collections. Musculoskeletal: No acute or significant osseous findings. Status post PLIF at L4-5. IMPRESSION: 1. No acute findings within the abdomen or pelvis. 2. Hepatic  steatosis. 3. Unchanged mild increased size of left ovary. No discrete left adnexal mass noted. Electronically Signed   By: TKerby MoorsM.D.   On: 10/11/2021 08:05    Procedures Procedures    Medications Ordered in ED Medications  sodium chloride (PF) 0.9 % injection (has no administration in time range)  HYDROmorphone (DILAUDID) injection 1 mg (1 mg Intravenous Given 10/11/21 0654)  ondansetron (ZOFRAN) injection 4 mg (4 mg Intravenous Given 10/11/21 0655)  haloperidol lactate (HALDOL) injection  2 mg (2 mg Intravenous Given 10/11/21 0657)  iohexol (OMNIPAQUE) 300 MG/ML solution 100 mL (100 mLs Intravenous Contrast Given 10/11/21 0737)    ED Course/ Medical Decision Making/ A&P Clinical Course as of 10/11/21 1026  Fri Oct 11, 2021  0654 Patient with history of epigastric abdominal pain, notable tracheostomy in place, history of thoracic aortic aneurysm, who presents again for epigastric abdominal pain, nausea, vomiting.  Patient reports that pain began again around 2 to 3 hours status post discharge yesterday.  No new symptoms but ongoing pain.  Took some home oxycodone prior to arrival around 12 AM.  Reports pain is 10/10 in severity.  Denies chest pain, does report that pain is worse with deep breathing.  Denies dysuria, frequency, hematuria. [CP]    Clinical Course User Index [CP] Anselmo Pickler, PA-C                           Medical Decision Making Amount and/or Complexity of Data Reviewed Radiology: ordered.  Risk Prescription drug management.   I discussed this case with my attending physician who cosigned this note including patient's presenting symptoms, physical exam, and planned diagnostics and interventions. Attending physician stated agreement with plan or made changes to plan which were implemented.   Attending physician assessed patient at bedside.  Patient with significant abdominal pain, she is returning for further evaluation after being discharged 1 day  prior to arrival.  Ongoing nausea, vomiting, abdominal pain.  She has comorbidity of tracheostomy, diabetes, obesity, previous thoracic aortic aneurysm.  Patient reports eating a large meal after discharge, with new onset abdominal pain after her meal.  Differential diagnosis for her pain includes gastroparesis, gastritis, gastroenteritis, less clinical concern for acute mesenteric ischemia versus Boerhaave's versus atypical ACS versus recurrence of her thoracic aortic aneurysm.  This is not an exhaustive differential.  As patient was just discharged, we performed shared decision making, decided that we will not repeat lab work.  I reviewed the lipase, CMP, CBC with differential performed at 1:30 AM this morning, as well as late 1/18.  Patient with interval increase of blood glucose, 202 at 132 this morning.  Improvement of kidney function from 1.22-1.17.  Continue to encourage fluid increase.  Her white blood cell count is stable with slight leukocytosis 11.7, slight anemia at 11.3.  Given her ongoing abdominal pain, and significant tenderness to palpation we will obtain CT abdomen pelvis.  CT abdomen pelvis shows hepatic steatosis, stable left ovary.  I agree with the radiologist interpretation.  No findings on CT to explain patient's pain at this time.   After administration of Zofran, Dilaudid, Haldol patient with significant improvement of pain.  She is able to tolerate p.o.  She appears comfortable at this time.  Discussed based on her presentation, pain that began directly after eating I have a suspicion that this may be due to delayed gastric emptying, gastroparesis, versus other gastritis or stomach abnormality.  Doubt mesenteric ischemia, doubt thoracic aortic aneurysm recurrence.  Spoke with patient who feels comfortable with discharge at this time, comfortable with plan to follow-up with GI.  Discussed I encourage small frequent meals, we will prescribe PPI, as well as Bentyl to help with symptoms.   Patient understands and agrees to plan.  She is discharged in stable condition at this time.  Return precautions are given. Final Clinical Impression(s) / ED Diagnoses Final diagnoses:  Gastroparesis  Epigastric pain    Rx / DC Orders  ED Discharge Orders          Ordered    pantoprazole (PROTONIX) 20 MG tablet  Daily        10/11/21 1009    dicyclomine (BENTYL) 20 MG tablet  2 times daily        10/11/21 1009              Charmaine Placido, [place] H, PA-C 10/11/21 1026    Lajean Saver, MD 10/11/21 [phone]

## 2021-10-11 NOTE — ED Notes (Signed)
PT opting to leave at this time. IV removed

## 2021-10-11 NOTE — ED Notes (Addendum)
Pt is upset because she was brought to triage and then that she is being placed in the lobby to wait for a room. Pt informed that there are no rooms at this time and she will have to wait for one to come open. Pt also states she wants pain medication in her IV. Pt informed that IV pain medications can not be given while she waits in the lobby due to previous hypotensive episodes noted on her previous charts after narcotics per PA. Pt informed of this and states she wants to leave. Pt informed that we will not D/C her without seeing a Dr and that we can't transfer her to [facility]. Pt begins attempting to call family to come pick her up. Pt has PIV in RAC, Miquela NT and Dorothea Ogle NT are in sort and have been informed of the PIV and if someone shows up to take her to remove her PIV before she departs. Pt taken to lobby in a wheelchair. Trach in place, pt in no apparent respiratory distress, no O2 needed.

## 2021-10-11 NOTE — ED Notes (Signed)
Patient's reports she has called daughter to provide transport.

## 2021-10-12 ENCOUNTER — Encounter (HOSPITAL_BASED_OUTPATIENT_CLINIC_OR_DEPARTMENT_OTHER): Payer: Self-pay

## 2021-10-12 ENCOUNTER — Emergency Department (HOSPITAL_BASED_OUTPATIENT_CLINIC_OR_DEPARTMENT_OTHER): Payer: 59

## 2021-10-12 ENCOUNTER — Emergency Department (HOSPITAL_BASED_OUTPATIENT_CLINIC_OR_DEPARTMENT_OTHER)
Admission: EM | Admit: 2021-10-12 | Discharge: 2021-10-13 | Disposition: A | Discharge status: Home / Self Care | Payer: 59 | Source: Home / Self Care | Attending: Emergency Medicine | Admitting: Emergency Medicine

## 2021-10-12 ENCOUNTER — Other Ambulatory Visit: Payer: Self-pay

## 2021-10-12 DIAGNOSIS — R079 Chest pain, unspecified: Secondary | ICD-10-CM

## 2021-10-12 DIAGNOSIS — K760 Fatty (change of) liver, not elsewhere classified: Secondary | ICD-10-CM | POA: Insufficient documentation

## 2021-10-12 DIAGNOSIS — M546 Pain in thoracic spine: Secondary | ICD-10-CM | POA: Insufficient documentation

## 2021-10-12 DIAGNOSIS — N179 Acute kidney failure, unspecified: Secondary | ICD-10-CM | POA: Insufficient documentation

## 2021-10-12 DIAGNOSIS — Z7982 Long term (current) use of aspirin: Secondary | ICD-10-CM | POA: Insufficient documentation

## 2021-10-12 DIAGNOSIS — R0789 Other chest pain: Secondary | ICD-10-CM | POA: Insufficient documentation

## 2021-10-12 DIAGNOSIS — K920 Hematemesis: Secondary | ICD-10-CM | POA: Diagnosis not present

## 2021-10-12 DIAGNOSIS — K2970 Gastritis, unspecified, without bleeding: Secondary | ICD-10-CM | POA: Diagnosis not present

## 2021-10-12 LAB — COMPREHENSIVE METABOLIC PANEL
ALT: 23 U/L (ref 0–44)
AST: 21 U/L (ref 15–41)
Albumin: 4.5 g/dL (ref 3.5–5.0)
Alkaline Phosphatase: 101 U/L (ref 38–126)
Anion gap: 18 — ABNORMAL HIGH (ref 5–15)
BUN: 21 mg/dL — ABNORMAL HIGH (ref 6–20)
CO2: 21 mmol/L — ABNORMAL LOW (ref 22–32)
Calcium: 9.8 mg/dL (ref 8.9–10.3)
Chloride: 101 mmol/L (ref 98–111)
Creatinine, Ser: 1.28 mg/dL — ABNORMAL HIGH (ref 0.44–1.00)
GFR, Estimated: 50 mL/min — ABNORMAL LOW (ref >60)
Glucose, Bld: 213 mg/dL — ABNORMAL HIGH (ref 70–99)
Potassium: 3.1 mmol/L — ABNORMAL LOW (ref 3.5–5.1)
Sodium: 140 mmol/L (ref 135–145)
Total Bilirubin: 0.5 mg/dL (ref 0.3–1.2)
Total Protein: 9 g/dL — ABNORMAL HIGH (ref 6.5–8.1)

## 2021-10-12 LAB — CBC WITH DIFFERENTIAL/PLATELET
Abs Immature Granulocytes: 0.03 10*3/uL (ref 0.00–0.07)
Basophils Absolute: 0 10*3/uL (ref 0.0–0.1)
Basophils Relative: 0 %
Eosinophils Absolute: 0 10*3/uL (ref 0.0–0.5)
Eosinophils Relative: 0 %
HCT: 36.1 % (ref 36.0–46.0)
Hemoglobin: 11.6 g/dL — ABNORMAL LOW (ref 12.0–15.0)
Immature Granulocytes: 0 %
Lymphocytes Relative: 20 %
Lymphs Abs: 2.1 10*3/uL (ref 0.7–4.0)
MCH: 26.8 pg (ref 26.0–34.0)
MCHC: 32.1 g/dL (ref 30.0–36.0)
MCV: 83.4 fL (ref 80.0–100.0)
Monocytes Absolute: 0.2 10*3/uL (ref 0.1–1.0)
Monocytes Relative: 2 %
Neutro Abs: 7.8 10*3/uL — ABNORMAL HIGH (ref 1.7–7.7)
Neutrophils Relative %: 78 %
Platelets: 401 10*3/uL — ABNORMAL HIGH (ref 150–400)
RBC: 4.33 MIL/uL (ref 3.87–5.11)
RDW: 14.4 % (ref 11.5–15.5)
WBC: 10.2 10*3/uL (ref 4.0–10.5)
nRBC: 0 % (ref 0.0–0.2)

## 2021-10-12 LAB — LACTIC ACID, PLASMA
Lactic Acid, Venous: 2.9 mmol/L (ref 0.5–1.9)
Lactic Acid, Venous: 1.7 mmol/L (ref 0.5–1.9)

## 2021-10-12 LAB — LIPASE, BLOOD: Lipase: 21 U/L (ref 11–51)

## 2021-10-12 MED ORDER — SODIUM CHLORIDE 0.9 % IV BOLUS
1000.0000 mL | Freq: Once | INTRAVENOUS | Status: AC
  Administered 2021-10-12: 1000 mL via INTRAVENOUS

## 2021-10-12 MED ORDER — PANTOPRAZOLE SODIUM 40 MG IV SOLR
40.0000 mg | Freq: Once | INTRAVENOUS | Status: AC
Start: 2021-10-12 — End: 2021-10-12
  Administered 2021-10-12: 40 mg via INTRAVENOUS
  Filled 2021-10-12: qty 40

## 2021-10-12 MED ORDER — IOHEXOL 350 MG/ML SOLN
100.0000 mL | Freq: Once | INTRAVENOUS | Status: AC | PRN
  Administered 2021-10-12: 100 mL via INTRAVENOUS

## 2021-10-12 MED ORDER — HALOPERIDOL LACTATE 5 MG/ML IJ SOLN
2.0000 mg | Freq: Once | INTRAMUSCULAR | Status: AC
Start: 2021-10-12 — End: 2021-10-12
  Administered 2021-10-12: 2 mg via INTRAVENOUS
  Filled 2021-10-12: qty 1

## 2021-10-12 MED ORDER — ONDANSETRON HCL 4 MG/2ML IJ SOLN
4.0000 mg | Freq: Once | INTRAMUSCULAR | Status: AC
  Administered 2021-10-12: 4 mg via INTRAVENOUS
  Filled 2021-10-12: qty 2

## 2021-10-12 MED ORDER — HYDROMORPHONE HCL 1 MG/ML IJ SOLN
1.0000 mg | Freq: Once | INTRAMUSCULAR | Status: AC
  Administered 2021-10-12: 1 mg via INTRAVENOUS
  Filled 2021-10-12: qty 1

## 2021-10-12 MED ORDER — FENTANYL CITRATE PF 50 MCG/ML IJ SOSY
100.0000 ug | PREFILLED_SYRINGE | Freq: Once | INTRAMUSCULAR | Status: AC
  Administered 2021-10-12: 100 ug via INTRAVENOUS
  Filled 2021-10-12: qty 2

## 2021-10-12 MED ORDER — IPRATROPIUM-ALBUTEROL 0.5-2.5 (3) MG/3ML IN SOLN
RESPIRATORY_TRACT | Status: AC
  Administered 2021-10-12: 3 mL
  Filled 2021-10-12: qty 3

## 2021-10-12 MED ORDER — MORPHINE SULFATE (PF) 4 MG/ML IV SOLN
4.0000 mg | Freq: Once | INTRAVENOUS | Status: AC
  Administered 2021-10-12: 4 mg via INTRAVENOUS
  Filled 2021-10-12: qty 1

## 2021-10-12 NOTE — ED Notes (Signed)
Per [facility], Patient BIB [facility] from Home for ABD Pain and Vomiting.  Patient was seen and discharged from ED yesterday but came for Re-Evaluation after Medications have had no Relief on Symptoms.   18G established in [place] by [facility]: 200 NS given and 4 mg Zofran given.  EMS VS: Hypertensive at 210/100 CBG 250 100% on RA SPO2; Trach in Place.

## 2021-10-12 NOTE — ED Notes (Signed)
CRITICAL VALUE STICKER  CRITICAL VALUE: lactic 2.9  RECEIVER (on-site recipient of call): Guadelupe Sabin, [place] NOTIFIED: 10/12/21 2012    MD NOTIFIED: Dr. Dina Rich  TIME OF NOTIFICATION:2013  RESPONSE: no verbal orders, receiving fluid bolus already

## 2021-10-12 NOTE — ED Provider Notes (Signed)
[facility] EMERGENCY DEPT Provider Note   CSN: [id number] Arrival date & time: 10/12/21  1454     History  Chief Complaint  Patient presents with   Abdominal Pain    [name] is a 51 y.o. female.  HPI  51 year old female with past medical history of chronic abdominal pain, chronic tracheostomy, ascending thoracic aortic aneurysm, obesity presents emergency department with concern for upper abdominal/back pain.  Patient has been seen multiple times before with similar presentation.  However this time she claims that the pain is more upper chest/back.  She is endorsing shortness of breath and severe pain.  Endorses nausea/vomiting/nonbloody diarrhea.  Denies any recent fever, productive cough.  Patient was seen yesterday with reassuring work-up and negative CT scanning of the abdomen and pelvis.  Home Medications Prior to Admission medications   Medication Sig Start Date End Date Taking? Authorizing Provider  albuterol (PROAIR HFA) 108 (90 Base) MCG/ACT inhaler Inhale 2 puffs into the lungs every 6 (six) hours as needed for wheezing or shortness of breath. 09/24/21   [name], MD  albuterol (PROVENTIL) (2.5 MG/3ML) 0.083% nebulizer solution Take 3 mLs (2.5 mg total) by nebulization every 6 (six) hours as needed for wheezing or shortness of breath. 09/24/21   [name], MD  aspirin EC 81 MG EC tablet Take 1 tablet (81 mg total) by mouth daily. Swallow whole. 08/27/21   Jessy Oto, MD  atorvastatin (LIPITOR) 20 MG tablet Take 1 tablet (20 mg total) by mouth daily. 09/24/21   [name], MD  Blood Glucose Monitoring Suppl (ONETOUCH VERIO) w/Device KIT 1 each by Does not apply route daily. Use to monitor glucose levels daily; E11.9 01/25/21   Renato Shin, MD  buPROPion [facility] SR) 150 MG 12 hr tablet Take 1 tablet (150 mg total) by mouth 2 (two) times daily. 09/24/21   [name], MD  cetirizine (ZYRTEC) 10 MG tablet Take 1 tablet (10  mg total) by mouth at bedtime. 03/06/21   Spero Geralds, MD  dapagliflozin propanediol (FARXIGA) 5 MG TABS tablet Take 1 tablet (5 mg total) by mouth daily before breakfast. 11/30/20   [name], MD  diclofenac Sodium (VOLTAREN) 1 % GEL Apply 4 g topically 4 (four) times daily. 11/27/20   [name], MD  dicyclomine (BENTYL) 20 MG tablet Take 1 tablet (20 mg total) by mouth 2 (two) times daily. 10/11/21   Prosperi, Christian H, PA-C  docusate sodium (COLACE) 100 MG capsule Take 1 capsule (100 mg total) by mouth 2 (two) times daily. 08/26/21   Jessy Oto, MD  furosemide (LASIX) 20 MG tablet Take 1 tablet (20 mg total) by mouth daily. Patient taking differently: Take 20 mg by mouth daily as needed for fluid or edema. 03/31/19   [name], MD  gabapentin (NEURONTIN) 300 MG capsule Take 2 capsules (600 mg total) by mouth 2 (two) times daily. 09/24/21   [name], MD  glucose blood (ONETOUCH VERIO) test strip Use to monitor glucose levels daily; E11.9 10/04/20   Renato Shin, MD  hydrOXYzine (ATARAX/VISTARIL) 25 MG tablet Take 1 tablet (25 mg total) by mouth 3 (three) times daily as needed for anxiety. 11/27/20   [name], MD  insulin degludec (TRESIBA FLEXTOUCH) 200 UNIT/ML FlexTouch Pen Inject 156 Units into the skin daily. Patient taking differently: Inject 155 Units into the skin at bedtime. 07/26/21   Renato Shin, MD  Insulin Pen Needle 31G X 8 MM MISC Inject 1 Units as directed daily.  07/30/18   [provider]  lisinopril (ZESTRIL) 20 MG tablet Take 1 tablet (20 mg total) by mouth daily. 09/24/21   [name], MD  Melatonin 3 MG TABS Take 3 mg by mouth at bedtime. 10/31/17   [provider]  methocarbamol (ROBAXIN) 750 MG tablet Take 1 tablet (750 mg total) by mouth every 8 (eight) hours as needed for muscle spasms. 09/17/21   [name], MD  Misc. Devices MISC Nebulizer device. Diagnosis - Asthma 07/16/20   [name], MD  Misc. Devices [employer].  Diagnosis-low back pain 01/23/21   [name], MD  Misc. Devices Peak chair 04/03/21   [name], MD  Misc. Devices MISC Bedside commode. Diagnosis- low back pain 09/02/21   [name], MD  mometasone-formoterol (DULERA) 200-5 MCG/ACT AERO Inhale 2 puffs into the lungs in the morning and at bedtime. 07/11/20   [name], MD  ondansetron (ZOFRAN) 4 MG tablet TAKE 1 TABLET BY MOUTH EVERY 8 HOURS AS NEEDED FOR NAUSEA OR VOMITING 07/16/21   [name], MD  OneTouch Delica Lancets 17C MISC 1 each by Does not apply route daily. Use to monitor glucose levels daily; E11.9 04/14/19   Renato Shin, MD  pantoprazole (PROTONIX) 20 MG tablet Take 1 tablet (20 mg total) by mouth daily. 10/11/21   Prosperi, Christian H, PA-C  pantoprazole (PROTONIX) 40 MG tablet Take 1 tablet (40 mg total) by mouth 2 (two) times daily. Patient taking differently: Take 40 mg by mouth daily as needed (heartburn). 04/30/21   [name], MD  polyethylene glycol (MIRALAX / GLYCOLAX) 17 g packet Take 17 g by mouth daily as needed for mild constipation. 08/26/21   Jessy Oto, MD  Semaglutide,0.25 or 0.5MG /DOS, (OZEMPIC, 0.25 OR 0.5 MG/DOSE,) 2 MG/1.5ML SOPN Inject 0.5 mg into the skin once a week. 04/10/21   Renato Shin, MD  sertraline (ZOLOFT) 100 MG tablet Take 1 tablet (100 mg total) by mouth at bedtime. 09/24/21   [name], MD  silver sulfADIAZINE (SILVADENE) 1 % cream Apply 1 application topically daily. 02/28/21   [name], MD  sucralfate (CARAFATE) 1 g tablet Take 1 tablet (1 g total) by mouth 4 (four) times daily -  with meals and at bedtime. Patient taking differently: Take 1 g by mouth at bedtime. 11/28/20   Zehr, Laban Emperor, PA-C  SUMAtriptan (IMITREX) 100 MG tablet At the onset of a migraine; may repeat in 2 hours. Max daily dose 200mg  09/24/21   [name], MD  topiramate (TOPAMAX) 100 MG tablet Take 1.5 tablets (150 mg total) by mouth 2 (two) times daily. 09/24/21   [name], MD  fluticasone-salmeterol (ADVAIR HFA) 230-21 MCG/ACT inhaler Inhale 2 puffs into the lungs 2 (two) times daily. 10/15/17 10/15/17  [name], MD      Allergies    Reglan [metoclopramide]    Review of Systems   Review of Systems  Constitutional:  Negative for fever.  Respiratory:  Negative for shortness of breath.   Cardiovascular:  Positive for chest pain.  Gastrointestinal:  Positive for abdominal pain, nausea and vomiting. Negative for diarrhea.  Musculoskeletal:  Positive for back pain.  Skin:  Negative for rash.  Neurological:  Negative for headaches.   Physical Exam Updated Vital Signs BP (!) 162/78 (BP Location: Right Arm)    Pulse 99    Temp 97.8 F (36.6 C)    Resp 18    Ht 5\' 1"  (1.549 m)    Wt 104.3  kg    LMP  (LMP Unknown)    SpO2 98%    BMI 43.45 kg/m  Physical Exam Vitals and nursing note reviewed.  Constitutional:      Appearance: Normal appearance. She is not diaphoretic.     Comments: On all fours on the side of the bed, moaning  HENT:     Head: Normocephalic.     Mouth/Throat:     Mouth: Mucous membranes are moist.  Cardiovascular:     Rate and Rhythm: Normal rate.  Pulmonary:     Effort: Pulmonary effort is normal. No respiratory distress.  Abdominal:     Palpations: Abdomen is soft.     Tenderness: There is generalized abdominal tenderness. There is no guarding or rebound.  Skin:    General: Skin is warm.  Neurological:     Mental Status: She is alert and oriented to person, place, and time. Mental status is at baseline.  Psychiatric:        Mood and Affect: Mood normal.    ED Results / Procedures / Treatments   Labs (all labs ordered are listed, but only abnormal results are displayed) Labs Reviewed  COMPREHENSIVE METABOLIC PANEL - Abnormal; Notable for the following components:      Result Value   Potassium 3.1 (*)    CO2 21 (*)    Glucose, Bld 213 (*)    BUN 21 (*)    Creatinine, Ser 1.28 (*)    Total Protein 9.0 (*)     GFR, Estimated 50 (*)    Anion gap 18 (*)    All other components within normal limits  CBC WITH DIFFERENTIAL/PLATELET - Abnormal; Notable for the following components:   Hemoglobin 11.6 (*)    Platelets 401 (*)    Neutro Abs 7.8 (*)    All other components within normal limits  LACTIC ACID, PLASMA - Abnormal; Notable for the following components:   Lactic Acid, Venous 2.9 (*)    All other components within normal limits  LIPASE, BLOOD  LACTIC ACID, PLASMA  URINALYSIS, ROUTINE W REFLEX MICROSCOPIC    EKG None  Radiology CT ABDOMEN PELVIS W CONTRAST  Result Date: 10/11/2021 CLINICAL DATA:  Abdominal pain. Nausea and vomiting with elevated WBCs. EXAM: CT ABDOMEN AND PELVIS WITH CONTRAST TECHNIQUE: Multidetector CT imaging of the abdomen and pelvis was performed using the standard protocol following bolus administration of intravenous contrast. RADIATION DOSE REDUCTION: This exam was performed according to the departmental dose-optimization program which includes automated exposure control, adjustment of the mA and/or kV according to patient size and/or use of iterative reconstruction technique. CONTRAST:  122m OMNIPAQUE IOHEXOL 300 MG/ML  SOLN COMPARISON:  04/01/2021 FINDINGS: Lower chest: No acute abnormality. Hepatobiliary: Diffuse low attenuation throughout the liver compatible with hepatic steatosis. No focal liver abnormality. Cholecystectomy. CBD measures 7 mm. No intrahepatic bile duct dilatation Pancreas: Unremarkable. No pancreatic ductal dilatation or surrounding inflammatory changes. Spleen: Normal in size without focal abnormality. Adrenals/Urinary Tract: Normal adrenal glands. The kidneys appear normal. No nephrolithiasis, hydronephrosis or mass. Diverticulosis noted arising off the anterior dome of bladder measuring 1.7 cm. Stomach/Bowel: Stomach is nondistended. Status post appendectomy. No bowel wall thickening, inflammation, or distension. Vascular/Lymphatic: No enlarged  abdominopelvic lymph nodes. No significant vascular abnormality. Reproductive: Status post hysterectomy. Prominent left ovary is again noted measuring 3.9 x 2.3 by 2.8 cm (volume = 13 cm^3). Normal appearance of the right ovary. Other: No ascites or focal fluid collections. Musculoskeletal: No acute or significant osseous findings. Status  post PLIF at L4-5. IMPRESSION: 1. No acute findings within the abdomen or pelvis. 2. Hepatic steatosis. 3. Unchanged mild increased size of left ovary. No discrete left adnexal mass noted. Electronically Signed   By: Kerby Moors M.D.   On: 10/11/2021 08:05    Procedures Procedures    Medications Ordered in ED Medications  ipratropium-albuterol (DUONEB) 0.5-2.5 (3) MG/3ML nebulizer solution (3 mLs  Given 10/12/21 1823)  sodium chloride 0.9 % bolus 1,000 mL ( Intravenous Stopped 10/12/21 2027)  ondansetron (ZOFRAN) injection 4 mg (4 mg Intravenous Given 10/12/21 1905)  morphine 4 MG/ML injection 4 mg (4 mg Intravenous Given 10/12/21 1905)  HYDROmorphone (DILAUDID) injection 1 mg (1 mg Intravenous Given 10/12/21 1937)  fentaNYL (SUBLIMAZE) injection 100 mcg (100 mcg Intravenous Given 10/12/21 2019)  iohexol (OMNIPAQUE) 350 MG/ML injection 100 mL (100 mLs Intravenous Contrast Given 10/12/21 2038)  haloperidol lactate (HALDOL) injection 2 mg (2 mg Intravenous Given 10/12/21 2220)  pantoprazole (PROTONIX) injection 40 mg (40 mg Intravenous Given 10/12/21 2220)    ED Course/ Medical Decision Making/ A&P                           Medical Decision Making Amount and/or Complexity of Data Reviewed Labs: ordered. Radiology: ordered.  Risk Prescription drug management.   This patient presents to the ED for concern of epigastric abdominal pain/back pain, this involves an extensive number of treatment options, and is a complaint that carries with it a high risk of complications and morbidity.  The differential diagnosis includes chronic abdominal pain, thoracic aortic  aneurysm pathology, dissection, PE, ACS.  Of note seen in the emergency department notable times for the same complaint in this past month, recent visit yesterday with a normal CT of the abdomen pelvis.   Additional history obtained: -External records from outside source obtained and reviewed including: Chart review including previous notes, labs, imaging, consultation notes   Lab Tests: -I ordered, reviewed, and interpreted labs.  The pertinent results include: Baseline blood work, mild AKI, normal lipase, initially elevated lactic at 2.9, normalized to 1.7   EKG -Declined EKG due to pain level   Imaging Studies ordered: -I ordered imaging studies including CTA of the chest abdomen pelvis -I independently visualized and interpreted imaging which showed no dissection, no PE, no acute abnormality -I agree with the radiologist interpretation   Medicines ordered and prescription drug management: -I ordered medication including nausea medicine, pain medicine, hydration for acute symptoms -Reevaluation of the patient after these medicines showed that the patient resolved -I have reviewed the patients home medicines and have made adjustments as needed   ED Course: 51 year old female presents emergency department with ongoing abdominal pain, radiating to her back with associated nausea/vomiting/diarrhea.  Seen multiple times before with similar complaints.  Known to have chronic abdominal pain.  Recently had a CT scan in the last day that was negative in regards to the abdomen and pelvis.  Initially was very traumatic on exam, on all fours, refusing abdominal exam, refusing EKG.  Blood work is baseline and reassuring, lipase is negative, initial lactic was elevated but normalized.  Concern for ischemic abdominal disease versus aortic pathology given her known history of a sending aortic aneurysm given the addition of back pain.  CT dissection study was negative.  Concern for chronic abdominal  pain/drug-seeking behavior.  After multiple rounds of medications including Haldol patient symptoms have completely resolved.  Low suspicion for ACS.  Symptoms have completely  resolved, she is tolerated p.o. and ambulated.   Cardiac Monitoring: The patient was maintained on a cardiac monitor.  I personally viewed and interpreted the cardiac monitored which showed an underlying rhythm of: Sinus   Reevaluation: After the interventions noted above, I reevaluated the patient and found that they have :resolved   Dispostion: Patient at this time appears safe and stable for discharge and close outpatient follow up. Discharge plan and strict return to ED precautions discussed, patient verbalizes understanding and agreement.        Final Clinical Impression(s) / ED Diagnoses Final diagnoses:  Chest pain, unspecified type    Rx / DC Orders ED Discharge Orders     None         Lorelle Gibbs, DO 10/13/21 0003

## 2021-10-12 NOTE — ED Triage Notes (Signed)
Patient BIB [facility] from Home with ABD Pain.  Patient was seen in ED yesterday for ABD Pain, Nausea, Vomiting. Patient was discharged but returns for Evaluation due to medications not being effective.  Trach in Place.  NAD Noted during Triage.

## 2021-10-12 NOTE — ED Notes (Signed)
Pt placed on ATC 5 LPM/21% humidified. Pt respiratory status stable w/no distress noted at this time. Pt tachypneic d/t pain level (per Pt not respiratory related). RT will continue to monitor.

## 2021-10-12 NOTE — ED Notes (Signed)
Patient states she would rather see the EDP prior to having additional testing completed. Patient states she just needs something for the pain. Patient made aware she will be brought into Examination Room as soon as once is available for this Patient. Patient understanding but upset and in obvious discomfort. Patient placed in Waiting Area in [place] of Staff for monitoring.

## 2021-10-12 NOTE — Discharge Instructions (Signed)
You have been seen and discharged from the emergency department.  Your blood work and CT findings were normal.  Follow-up with your primary provider for further evaluation and further care. Take home medications as prescribed. If you have any worsening symptoms or further concerns for your health please return to an emergency department for further evaluation.

## 2021-10-13 ENCOUNTER — Other Ambulatory Visit: Payer: Self-pay

## 2021-10-13 ENCOUNTER — Emergency Department (HOSPITAL_COMMUNITY): Payer: 59

## 2021-10-13 ENCOUNTER — Encounter (HOSPITAL_COMMUNITY): Payer: Self-pay

## 2021-10-13 ENCOUNTER — Emergency Department (HOSPITAL_COMMUNITY)
Admission: EM | Admit: 2021-10-13 | Discharge: 2021-10-13 | Disposition: A | Discharge status: Home / Self Care | Payer: 59 | Source: Home / Self Care | Attending: Student | Admitting: Student

## 2021-10-13 DIAGNOSIS — I10 Essential (primary) hypertension: Secondary | ICD-10-CM | POA: Insufficient documentation

## 2021-10-13 DIAGNOSIS — Z79899 Other long term (current) drug therapy: Secondary | ICD-10-CM | POA: Insufficient documentation

## 2021-10-13 DIAGNOSIS — R197 Diarrhea, unspecified: Secondary | ICD-10-CM | POA: Insufficient documentation

## 2021-10-13 DIAGNOSIS — R000 Tachycardia, unspecified: Secondary | ICD-10-CM | POA: Insufficient documentation

## 2021-10-13 DIAGNOSIS — Z7982 Long term (current) use of aspirin: Secondary | ICD-10-CM | POA: Insufficient documentation

## 2021-10-13 DIAGNOSIS — F419 Anxiety disorder, unspecified: Secondary | ICD-10-CM | POA: Insufficient documentation

## 2021-10-13 DIAGNOSIS — Z794 Long term (current) use of insulin: Secondary | ICD-10-CM | POA: Insufficient documentation

## 2021-10-13 DIAGNOSIS — K3184 Gastroparesis: Secondary | ICD-10-CM | POA: Insufficient documentation

## 2021-10-13 DIAGNOSIS — J449 Chronic obstructive pulmonary disease, unspecified: Secondary | ICD-10-CM | POA: Insufficient documentation

## 2021-10-13 DIAGNOSIS — R1013 Epigastric pain: Secondary | ICD-10-CM

## 2021-10-13 DIAGNOSIS — E119 Type 2 diabetes mellitus without complications: Secondary | ICD-10-CM | POA: Insufficient documentation

## 2021-10-13 DIAGNOSIS — R112 Nausea with vomiting, unspecified: Secondary | ICD-10-CM

## 2021-10-13 DIAGNOSIS — Z7951 Long term (current) use of inhaled steroids: Secondary | ICD-10-CM | POA: Insufficient documentation

## 2021-10-13 LAB — CBC WITH DIFFERENTIAL/PLATELET
Abs Immature Granulocytes: 0.06 10*3/uL (ref 0.00–0.07)
Basophils Absolute: 0 10*3/uL (ref 0.0–0.1)
Basophils Relative: 0 %
Eosinophils Absolute: 0 10*3/uL (ref 0.0–0.5)
Eosinophils Relative: 0 %
HCT: 36.5 % (ref 36.0–46.0)
Hemoglobin: 11.5 g/dL — ABNORMAL LOW (ref 12.0–15.0)
Immature Granulocytes: 1 %
Lymphocytes Relative: 23 %
Lymphs Abs: 2.3 10*3/uL (ref 0.7–4.0)
MCH: 26.9 pg (ref 26.0–34.0)
MCHC: 31.5 g/dL (ref 30.0–36.0)
MCV: 85.3 fL (ref 80.0–100.0)
Monocytes Absolute: 0.4 10*3/uL (ref 0.1–1.0)
Monocytes Relative: 4 %
Neutro Abs: 7.4 10*3/uL (ref 1.7–7.7)
Neutrophils Relative %: 72 %
Platelets: 389 10*3/uL (ref 150–400)
RBC: 4.28 MIL/uL (ref 3.87–5.11)
RDW: 14.4 % (ref 11.5–15.5)
WBC: 10.2 10*3/uL (ref 4.0–10.5)
nRBC: 0 % (ref 0.0–0.2)

## 2021-10-13 LAB — URINALYSIS, ROUTINE W REFLEX MICROSCOPIC
Bacteria, UA: NONE SEEN
Bilirubin Urine: NEGATIVE
Glucose, UA: 50 mg/dL — AB
Hgb urine dipstick: NEGATIVE
Ketones, ur: NEGATIVE mg/dL
Leukocytes,Ua: NEGATIVE
Nitrite: NEGATIVE
Protein, ur: 100 mg/dL — AB
Specific Gravity, Urine: 1.012 (ref 1.005–1.030)
pH: 7 (ref 5.0–8.0)

## 2021-10-13 LAB — TROPONIN I (HIGH SENSITIVITY)
Troponin I (High Sensitivity): 6 ng/L (ref <18)
Troponin I (High Sensitivity): 6 ng/L (ref <18)

## 2021-10-13 LAB — COMPREHENSIVE METABOLIC PANEL
ALT: 28 U/L (ref 0–44)
AST: 27 U/L (ref 15–41)
Albumin: 3.8 g/dL (ref 3.5–5.0)
Alkaline Phosphatase: 102 U/L (ref 38–126)
Anion gap: 14 (ref 5–15)
BUN: 13 mg/dL (ref 6–20)
CO2: 22 mmol/L (ref 22–32)
Calcium: 9.2 mg/dL (ref 8.9–10.3)
Chloride: 99 mmol/L (ref 98–111)
Creatinine, Ser: 1.21 mg/dL — ABNORMAL HIGH (ref 0.44–1.00)
GFR, Estimated: 54 mL/min — ABNORMAL LOW (ref >60)
Glucose, Bld: 245 mg/dL — ABNORMAL HIGH (ref 70–99)
Potassium: 3.1 mmol/L — ABNORMAL LOW (ref 3.5–5.1)
Sodium: 135 mmol/L (ref 135–145)
Total Bilirubin: 0.5 mg/dL (ref 0.3–1.2)
Total Protein: 8.4 g/dL — ABNORMAL HIGH (ref 6.5–8.1)

## 2021-10-13 LAB — RAPID URINE DRUG SCREEN, HOSP PERFORMED
Amphetamines: NOT DETECTED
Barbiturates: NOT DETECTED
Benzodiazepines: NOT DETECTED
Cocaine: NOT DETECTED
Opiates: POSITIVE — AB
Tetrahydrocannabinol: NOT DETECTED

## 2021-10-13 LAB — LIPASE, BLOOD: Lipase: 32 U/L (ref 11–51)

## 2021-10-13 LAB — LACTIC ACID, PLASMA
Lactic Acid, Venous: 2.4 mmol/L (ref 0.5–1.9)
Lactic Acid, Venous: 2.8 mmol/L (ref 0.5–1.9)

## 2021-10-13 MED ORDER — ONDANSETRON 4 MG PO TBDP: ORAL_TABLET | ORAL | 0 refills | Status: DC

## 2021-10-13 MED ORDER — PANTOPRAZOLE SODIUM 40 MG IV SOLR
40.0000 mg | Freq: Once | INTRAVENOUS | Status: AC
  Administered 2021-10-13: 40 mg via INTRAVENOUS
  Filled 2021-10-13: qty 40

## 2021-10-13 MED ORDER — LIDOCAINE VISCOUS HCL 2 % MT SOLN
15.0000 mL | Freq: Once | OROMUCOSAL | Status: AC
  Administered 2021-10-13: 15 mL via ORAL
  Filled 2021-10-13: qty 15

## 2021-10-13 MED ORDER — SODIUM CHLORIDE 0.9 % IV BOLUS
1000.0000 mL | Freq: Once | INTRAVENOUS | Status: AC
  Administered 2021-10-13: 1000 mL via INTRAVENOUS

## 2021-10-13 MED ORDER — SUCRALFATE 1 G PO TABS: 1.0000 g | ORAL_TABLET | Freq: Three times a day (TID) | ORAL | 1 refills | Status: DC

## 2021-10-13 MED ORDER — HYDROMORPHONE HCL 1 MG/ML IJ SOLN
1.0000 mg | Freq: Once | INTRAMUSCULAR | Status: AC
  Administered 2021-10-13: 1 mg via INTRAVENOUS
  Filled 2021-10-13: qty 1

## 2021-10-13 MED ORDER — DROPERIDOL 2.5 MG/ML IJ SOLN
2.5000 mg | Freq: Once | INTRAMUSCULAR | Status: AC
  Administered 2021-10-13: 2.5 mg via INTRAVENOUS
  Filled 2021-10-13: qty 2

## 2021-10-13 MED ORDER — ONDANSETRON HCL 4 MG/2ML IJ SOLN
4.0000 mg | Freq: Once | INTRAMUSCULAR | Status: AC
  Administered 2021-10-13: 4 mg via INTRAVENOUS
  Filled 2021-10-13: qty 2

## 2021-10-13 MED ORDER — ALUM & MAG HYDROXIDE-SIMETH 200-200-20 MG/5ML PO SUSP
30.0000 mL | Freq: Once | ORAL | Status: AC
  Administered 2021-10-13: 30 mL via ORAL
  Filled 2021-10-13: qty 30

## 2021-10-13 NOTE — ED Provider Notes (Signed)
[facility] EMERGENCY DEPARTMENT Provider Note   CSN: [id number] Arrival date & time: 10/13/21  1428     History  Chief Complaint  Patient presents with   Abdominal Pain    [name] is a 51 y.o. female.  [name] is a 51 y.o. female with a history of hypertension, diabetes, COPD, GERD, hyperlipidemia, and patient reports a possible history of gastroparesis, who presents to the emergency department for evaluation of severe epigastric pain, nausea and vomiting.  This is her fourth ED visit over the past few days for the symptoms.  Was seen most recently yesterday.  Reports that when she comes to the ED her symptoms do resolve temporarily, but when she arrives home her symptoms typically quickly return.  She reports she has had nausea and vomiting has not seen any blood in her vomiting, has had some occasional diarrhea, no melena or hematochezia.  Reports severe sharp epigastric pain that seems to worsen before she has to throw up.  No associated chest pain or shortness of breath.  Has trach that has been in place since 2018.  Patient had lab work and dissection study yesterday that were reassuring.  Reports taking Phenergan at home without improvement.  The history is provided by the patient and medical records.      Home Medications Prior to Admission medications   Medication Sig Start Date End Date Taking? Authorizing Provider  albuterol (PROAIR HFA) 108 (90 Base) MCG/ACT inhaler Inhale 2 puffs into the lungs every 6 (six) hours as needed for wheezing or shortness of breath. 09/24/21   [name], MD  albuterol (PROVENTIL) (2.5 MG/3ML) 0.083% nebulizer solution Take 3 mLs (2.5 mg total) by nebulization every 6 (six) hours as needed for wheezing or shortness of breath. 09/24/21   [name], MD  aspirin EC 81 MG EC tablet Take 1 tablet (81 mg total) by mouth daily. Swallow whole. 08/27/21   Jessy Oto, MD  atorvastatin  (LIPITOR) 20 MG tablet Take 1 tablet (20 mg total) by mouth daily. 09/24/21   [name], MD  Blood Glucose Monitoring Suppl (ONETOUCH VERIO) w/Device KIT 1 each by Does not apply route daily. Use to monitor glucose levels daily; E11.9 01/25/21   Renato Shin, MD  buPROPion [employer] SR) 150 MG 12 hr tablet Take 1 tablet (150 mg total) by mouth 2 (two) times daily. 09/24/21   [name], MD  cetirizine (ZYRTEC) 10 MG tablet Take 1 tablet (10 mg total) by mouth at bedtime. 03/06/21   Spero Geralds, MD  dapagliflozin propanediol (FARXIGA) 5 MG TABS tablet Take 1 tablet (5 mg total) by mouth daily before breakfast. 11/30/20   [name], MD  diclofenac Sodium (VOLTAREN) 1 % GEL Apply 4 g topically 4 (four) times daily. 11/27/20   [name], MD  dicyclomine (BENTYL) 20 MG tablet Take 1 tablet (20 mg total) by mouth 2 (two) times daily. 10/11/21   Prosperi, Christian H, PA-C  docusate sodium (COLACE) 100 MG capsule Take 1 capsule (100 mg total) by mouth 2 (two) times daily. 08/26/21   Jessy Oto, MD  furosemide (LASIX) 20 MG tablet Take 1 tablet (20 mg total) by mouth daily. Patient taking differently: Take 20 mg by mouth daily as needed for fluid or edema. 03/31/19   [name], MD  gabapentin (NEURONTIN) 300 MG capsule Take 2 capsules (600 mg total) by mouth 2 (two) times daily. 09/24/21   [name], MD  glucose  blood (ONETOUCH VERIO) test strip Use to monitor glucose levels daily; E11.9 10/04/20   Renato Shin, MD  hydrOXYzine (ATARAX/VISTARIL) 25 MG tablet Take 1 tablet (25 mg total) by mouth 3 (three) times daily as needed for anxiety. 11/27/20   [name], MD  insulin degludec (TRESIBA FLEXTOUCH) 200 UNIT/ML FlexTouch Pen Inject 156 Units into the skin daily. Patient taking differently: Inject 155 Units into the skin at bedtime. 07/26/21   Renato Shin, MD  Insulin Pen Needle 31G X 8 MM MISC Inject 1 Units as directed daily. 07/30/18   [provider]  lisinopril  (ZESTRIL) 20 MG tablet Take 1 tablet (20 mg total) by mouth daily. 09/24/21   [name], MD  Melatonin 3 MG TABS Take 3 mg by mouth at bedtime. 10/31/17   [provider]  methocarbamol (ROBAXIN) 750 MG tablet Take 1 tablet (750 mg total) by mouth every 8 (eight) hours as needed for muscle spasms. 09/17/21   [name], MD  Misc. Devices MISC Nebulizer device. Diagnosis - Asthma 07/16/20   [name], MD  Misc. Devices [employer].  Diagnosis-low back pain 01/23/21   [name], MD  Misc. Devices [place] chair 04/03/21   [name], MD  Misc. Devices MISC Bedside commode. Diagnosis- low back pain 09/02/21   [name], MD  mometasone-formoterol (DULERA) 200-5 MCG/ACT AERO Inhale 2 puffs into the lungs in the morning and at bedtime. 07/11/20   [name], MD  ondansetron (ZOFRAN) 4 MG tablet TAKE 1 TABLET BY MOUTH EVERY 8 HOURS AS NEEDED FOR NAUSEA OR VOMITING 07/16/21   [name], MD  OneTouch Delica Lancets 63W MISC 1 each by Does not apply route daily. Use to monitor glucose levels daily; E11.9 04/14/19   Renato Shin, MD  pantoprazole (PROTONIX) 20 MG tablet Take 1 tablet (20 mg total) by mouth daily. 10/11/21   Prosperi, Christian H, PA-C  pantoprazole (PROTONIX) 40 MG tablet Take 1 tablet (40 mg total) by mouth 2 (two) times daily. Patient taking differently: Take 40 mg by mouth daily as needed (heartburn). 04/30/21   [name], MD  polyethylene glycol (MIRALAX / GLYCOLAX) 17 g packet Take 17 g by mouth daily as needed for mild constipation. 08/26/21   Jessy Oto, MD  Semaglutide,0.25 or 0.5MG/DOS, (OZEMPIC, 0.25 OR 0.5 MG/DOSE,) 2 MG/1.5ML SOPN Inject 0.5 mg into the skin once a week. 04/10/21   Renato Shin, MD  sertraline (ZOLOFT) 100 MG tablet Take 1 tablet (100 mg total) by mouth at bedtime. 09/24/21   [name], MD  silver sulfADIAZINE (SILVADENE) 1 % cream Apply 1 application topically daily. 02/28/21   [name], MD  sucralfate  (CARAFATE) 1 g tablet Take 1 tablet (1 g total) by mouth 4 (four) times daily -  with meals and at bedtime. Patient taking differently: Take 1 g by mouth at bedtime. 11/28/20   Zehr, Laban Emperor, PA-C  SUMAtriptan (IMITREX) 100 MG tablet At the onset of a migraine; may repeat in 2 hours. Max daily dose 260m 09/24/21   NCharlott Rakes MD  topiramate (TOPAMAX) 100 MG tablet Take 1.5 tablets (150 mg total) by mouth 2 (two) times daily. 09/24/21   NCharlott Rakes MD  fluticasone-salmeterol (ADVAIR HFA) 230-21 MCG/ACT inhaler Inhale 2 puffs into the lungs 2 (two) times daily. 10/15/17 10/15/17  NCharlott Rakes MD      Allergies    Reglan [metoclopramide]    Review of Systems   Review of Systems  Constitutional:  Negative for  chills and fever.  HENT: Negative.    Respiratory:  Negative for cough and shortness of breath.   Cardiovascular:  Negative for chest pain.  Gastrointestinal:  Positive for abdominal pain, diarrhea, nausea and vomiting. Negative for blood in stool and constipation.  Genitourinary:  Negative for dysuria and frequency.  Musculoskeletal:  Negative for arthralgias and myalgias.  Skin:  Negative for color change and rash.  All other systems reviewed and are negative.  Physical Exam Updated Vital Signs BP (!) 145/89 (BP Location: Right Arm)    Pulse 98    Temp 98.9 F (37.2 C) (Oral)    Resp 20    Ht 5' 1" (1.549 m)    Wt 104.3 kg    LMP  (LMP Unknown)    SpO2 98%    BMI 43.45 kg/m  Physical Exam Vitals and nursing note reviewed.  Constitutional:      General: She is not in acute distress.    Appearance: Normal appearance. She is well-developed. She is obese. She is ill-appearing. She is not diaphoretic.     Comments: Alert, ill-appearing, actively vomiting and in pain  HENT:     Head: Normocephalic and atraumatic.     Mouth/Throat:     Mouth: Mucous membranes are moist.     Pharynx: Oropharynx is clear.  Eyes:     General:        Right eye: No discharge.        Left eye:  No discharge.     Pupils: Pupils are equal, round, and reactive to light.  Cardiovascular:     Rate and Rhythm: Regular rhythm. Tachycardia present.     Pulses: Normal pulses.     Heart sounds: Normal heart sounds.  Pulmonary:     Effort: Pulmonary effort is normal. No respiratory distress.     Breath sounds: Normal breath sounds. No wheezing or rales.     Comments: Respirations equal and unlabored, patient able to speak in full sentences, lungs clear to auscultation bilaterally  Abdominal:     General: Bowel sounds are normal. There is no distension.     Palpations: Abdomen is soft. There is no mass.     Tenderness: There is abdominal tenderness in the epigastric area. There is no guarding.     Comments: Abdomen soft, nondistended, bowel sounds present, focal tenderness in the epigastric region, all other quadrants nontender to palpation, no guarding or peritoneal signs.  Musculoskeletal:        General: No deformity.     Cervical back: Neck supple.  Skin:    General: Skin is warm and dry.     Capillary Refill: Capillary refill takes less than 2 seconds.  Neurological:     Mental Status: She is alert and oriented to person, place, and time.     Coordination: Coordination normal.     Comments: Speech is clear, able to follow commands Moves extremities without ataxia, coordination intact  Psychiatric:        Mood and Affect: Mood is anxious.        Behavior: Behavior normal.    ED Results / Procedures / Treatments   Labs (all labs ordered are listed, but only abnormal results are displayed) Labs Reviewed  COMPREHENSIVE METABOLIC PANEL - Abnormal; Notable for the following components:      Result Value   Potassium 3.1 (*)    Glucose, Bld 245 (*)    Creatinine, Ser 1.21 (*)    Total Protein 8.4 (*)  GFR, Estimated 54 (*)    All other components within normal limits  CBC WITH DIFFERENTIAL/PLATELET - Abnormal; Notable for the following components:   Hemoglobin 11.5 (*)     All other components within normal limits  LACTIC ACID, PLASMA - Abnormal; Notable for the following components:   Lactic Acid, Venous 2.4 (*)    All other components within normal limits  LACTIC ACID, PLASMA - Abnormal; Notable for the following components:   Lactic Acid, Venous 2.8 (*)    All other components within normal limits  URINALYSIS, ROUTINE W REFLEX MICROSCOPIC - Abnormal; Notable for the following components:   Color, Urine STRAW (*)    Glucose, UA 50 (*)    Protein, ur 100 (*)    All other components within normal limits  RAPID URINE DRUG SCREEN, HOSP PERFORMED - Abnormal; Notable for the following components:   Opiates POSITIVE (*)    All other components within normal limits  LIPASE, BLOOD  TROPONIN I (HIGH SENSITIVITY)  TROPONIN I (HIGH SENSITIVITY)    EKG EKG Interpretation  Date/Time:  Sunday October 13 2021 14:32:49 EST Ventricular Rate:  111 PR Interval:  168 QRS Duration: 84 QT Interval:  294 QTC Calculation: 399 R Axis:   -1 Text Interpretation: Sinus tachycardia When compared with ECG of 09-Oct-2021 20:48, PREVIOUS ECG IS PRESENT Confirmed by Kommor, Madison ([id number]) on 10/13/2021 5:59:10 PM  Radiology DG Abdomen Acute W/Chest  Result Date: 10/13/2021 CLINICAL DATA:  Abdominal pain. EXAM: DG ABDOMEN ACUTE WITH 1 VIEW CHEST COMPARISON:  CTA chest abdomen pelvis 10/12/2021 FINDINGS: Tracheostomy tube noted. Low lung volumes without edema or focal consolidation. No pleural effusion. Cardiopericardial silhouette is at upper limits of normal for size. The visualized bony structures of the thorax show no acute abnormality. Upright abdominal film does not include the hemidiaphragms no evidence for free air on the chest x-ray. There is no evidence for gaseous bowel dilation to suggest obstruction. Fixation hardware noted lower lumbar spine. Surgical clips in the right upper quadrant suggest prior cholecystectomy IMPRESSION: 1. No acute cardiopulmonary findings. 2. No  evidence for bowel perforation or obstruction. Electronically Signed   By: Misty Stanley M.D.   On: 10/13/2021 15:34   CT Angio Chest/Abd/Pel for Dissection W and/or Wo Contrast  Result Date: 10/12/2021 CLINICAL DATA:  Chest and back pain.  Concern for aortic syndrome. EXAM: CT ANGIOGRAPHY CHEST, ABDOMEN AND PELVIS TECHNIQUE: Non-contrast CT of the chest was initially obtained. Multidetector CT imaging through the chest, abdomen and pelvis was performed using the standard protocol during bolus administration of intravenous contrast. Multiplanar reconstructed images and MIPs were obtained and reviewed to evaluate the vascular anatomy. RADIATION DOSE REDUCTION: This exam was performed according to the departmental dose-optimization program which includes automated exposure control, adjustment of the mA and/or kV according to patient size and/or use of iterative reconstruction technique. CONTRAST:  159m OMNIPAQUE IOHEXOL 350 MG/ML SOLN COMPARISON:  CT abdomen pelvis dated 10/11/2021 FINDINGS: CTA CHEST FINDINGS Cardiovascular: There is no cardiomegaly or pericardial effusion. The thoracic aorta is unremarkable. The origins of the great vessels of the aortic arch appear patent. The central pulmonary arteries are unremarkable. Mediastinum/Nodes: No hilar or mediastinal adenopathy. The esophagus is grossly unremarkable. No mediastinal fluid collection. Lungs/Pleura: The lungs are clear. There is no pleural effusion pneumothorax. The central airways are patent. Tracheostomy with tip approximately 5.5 cm above the carina. Musculoskeletal: No acute osseous pathology. Review of the MIP images confirms the above findings. CTA ABDOMEN AND PELVIS FINDINGS VASCULAR Aorta:  Normal caliber aorta without aneurysm, dissection, vasculitis or significant stenosis. Celiac: Patent without evidence of aneurysm, dissection, vasculitis or significant stenosis. SMA: Patent without evidence of aneurysm, dissection, vasculitis or  significant stenosis. Renals: Both renal arteries are patent without evidence of aneurysm, dissection, vasculitis, fibromuscular dysplasia or significant stenosis. IMA: Patent without evidence of aneurysm, dissection, vasculitis or significant stenosis. Inflow: Patent without evidence of aneurysm, dissection, vasculitis or significant stenosis. Veins: No obvious venous abnormality within the limitations of this arterial phase study. Review of the MIP images confirms the above findings. NON-VASCULAR No intra-abdominal free air or free fluid. Hepatobiliary: Fatty liver. No intrahepatic biliary ductal dilatation. Cholecystectomy. Pancreas: Unremarkable. No pancreatic ductal dilatation or surrounding inflammatory changes. Spleen: Normal in size without focal abnormality. Adrenals/Urinary Tract: Left adrenal thickening or hyperplasia. The right adrenal gland is unremarkable. There is no hydronephrosis on either side. The visualized ureters and urinary bladder appear unremarkable. Stomach/Bowel: No bowel obstruction or active inflammation. The appendix is not visualized with certainty. No inflammatory changes identified in the right lower quadrant. Lymphatic: No adenopathy. Reproductive: Hysterectomy. Asymmetric enlargement of the left ovary. Similar appearance of a 2 cm fluid density ovoid lesion in the anterior pelvis to the right of the midline. Other: Midline vertical anterior abdominal wall incisional scar. Musculoskeletal: Lower lumbar posterior fusion and disc spacer. No acute osseous pathology. Review of the MIP images confirms the above findings. IMPRESSION: 1. No acute intrathoracic, abdominal, or pelvic pathology. No aortic dissection or aneurysm. 2. Fatty liver. Electronically Signed   By: Anner Crete M.D.   On: 10/12/2021 21:07    Procedures Procedures    Medications Ordered in ED Medications  HYDROmorphone (DILAUDID) injection 1 mg (1 mg Intravenous Given 10/13/21 1734)  ondansetron (ZOFRAN)  injection 4 mg (4 mg Intravenous Given 10/13/21 1734)  sodium chloride 0.9 % bolus 1,000 mL (0 mLs Intravenous Stopped 10/13/21 1845)  pantoprazole (PROTONIX) injection 40 mg (40 mg Intravenous Given 10/13/21 1734)  droperidol (INAPSINE) 2.5 MG/ML injection 2.5 mg (2.5 mg Intravenous Given 10/13/21 1834)  alum & mag hydroxide-simeth (MAALOX/MYLANTA) 200-200-20 MG/5ML suspension 30 mL (30 mLs Oral Given 10/13/21 1848)    And  lidocaine (XYLOCAINE) 2 % viscous mouth solution 15 mL (15 mLs Oral Given 10/13/21 1848)    ED Course/ Medical Decision Making/ A&P                           Leonetta Mcgivern [name] is a 51 y.o. female presents to the ED for concern of abdominal pain, nausea, this involves an extensive number of treatment options, and is a complaint that carries with it a high risk of complications and morbidity.  The differential diagnosis includes gastroparesis, gastritis, gastroenteritis, peptic ulcer disease, pancreatitis, cyclical vomiting, cholecystitis, obstruction, perforation   Additional history obtained:  Additional history obtained from chart review External records from outside source obtained and reviewed including multiple recent ED encounters   Lab Tests:  I Ordered, reviewed, and interpreted labs.  The pertinent results include:  No leukocytosis, hemoglobin at baseline, mild hypokalemia, glucose of 245, creatinine at baseline, negative troponins.  UA without evidence of infection.   Imaging Studies ordered:  I ordered imaging studies including acute abdominal series I independently visualized and interpreted imaging which showed no acute cardiopulmonary findings, no evidence of bowel perforation or obstruction, normal bowel gas pattern. I reviewed CT dissection study completed yesterday which showed no acute intrathoracic, abdominal or pelvic pathology. I agree with the radiologist interpretation  Medicines ordered and prescription drug management:  I ordered  medication including Dilaudid and Zofran for symptomatic treatment Reevaluation of the patient after these medicines showed that the patient  improved initially but patient continues to complain of some pain and nausea, IV droperidol ordered I have reviewed the patients home medicines and have made adjustments as needed   ED Course:  Initially ill-appearing with active vomiting and persistent pain.  Has presented with similar symptoms over the past few days.  Had reassuring work-up and improvement in symptoms yesterday. Initial medications with minimal improvement but after droperidol patient has significantly improved and her work-up today has been reassuring.  Labs appear stable. Counseled patient on advancing diet slowly, and home medication use with hopes that this will help keep symptoms from recurring so quickly.  Also stressed the importance of better blood sugar control.   Reevaluation:  After the interventions noted above, I reevaluated the patient and found that they have :improved, after droperidol patient's pain and vomiting have resolved and she is tolerating p.o. fluids.   Dispostion:  After consideration of the diagnostic results and the patients response to treatment feel that the patent would benefit from discharge home with continued supportive treatment.  I have high clinical suspicion that symptoms may be due to underlying gastroparesis which may be worsened due to poor glycemic control.  Prescribed Zofran and Carafate to help better control symptoms.  Also stressed the importance of starting with small amounts of clear liquids frequently throughout the day and advancing diet very slowly.  Stressed close follow-up with GI and strict return precautions.  Patient expresses understanding and agreement.  Discharged home.         Final Clinical Impression(s) / ED Diagnoses Final diagnoses:  Epigastric pain  Nausea and vomiting, unspecified vomiting type  Gastroparesis     Rx / DC Orders ED Discharge Orders          Ordered    sucralfate (CARAFATE) 1 g tablet  3 times daily with meals & bedtime        10/13/21 2153    ondansetron (ZOFRAN-ODT) 4 MG disintegrating tablet        10/13/21 2153              Jacqlyn Larsen, PA-C 10/17/21 [place], [place], MD 10/17/21 2332

## 2021-10-13 NOTE — ED Notes (Signed)
Pt ambulatory with walker, providing urine sample at this time.

## 2021-10-13 NOTE — ED Triage Notes (Addendum)
Pt arrived via POV for c/o 10/10 sharp pain in epigastric area of abdomen. Pt c/o N/V and occasional diarrheax4 days. Pt has vomited around 10 times in the past 24 hrs. Pt has a trach. Pt is crying in triage and rocking back and forth in pain.

## 2021-10-13 NOTE — Discharge Instructions (Addendum)
Please use Zofran every 4 hours for the next 24 hours and then as needed.  Continue taking your Carafate up to 4 times daily, and continue taking Protonix 40 mg daily before you have anything to eat for the day.  Because you have not had much over your stomach over the past few days is important that you go slow and advance your diet very slowly.  Start with very small cups of clear liquids and slowly increase the amount you were drinking starting with 1 to 2 ounces every 30 minutes.  As long as this is going well you can start to slowly advance your diet to small amounts of bland foods like saltine crackers, toast or rice.  If you try and put too much on your stomach too quickly you will start vomiting again and it will likely lead to the pain returning as well.  Please call to schedule close follow-up with your GI doctor.  Keeping your blood sugar under better control will also help to reduce the symptoms.  Return for new or worsening symptoms.

## 2021-10-13 NOTE — ED Notes (Signed)
Lactic Acid 2.4 reported to PA [employer]

## 2021-10-13 NOTE — ED Provider Triage Note (Signed)
Emergency Medicine Provider Triage Evaluation Note  [name] , a 51 y.o. female  was evaluated in triage.  Pt complains of abdominal pain.  She has been seen multiple times recently for the same, most recently yesterday when she had a CTA dissection study performed without cause for her symptoms found.  She reports vomiting about 10 times in the past 24 hours   Review of Systems  Positive:  Negative: See above  Physical Exam  Ht 5\' 1"  (1.549 m)    Wt 104.3 kg    LMP  (LMP Unknown)    BMI 43.45 kg/m  Gen:   Awake, tearful, appears uncomfortable Resp:  Normal effort trach present. MSK:   Moves extremities without difficulty  Other:  Patient appears uncomfortable.  Medical Decision Making  Medically screening exam initiated at 2:41 PM.  Appropriate orders placed.  [name] was informed that the remainder of the evaluation will be completed by another provider, this initial triage assessment does not replace that evaluation, and the importance of remaining in the ED until their evaluation is complete.  Note: Portions of this report may have been transcribed using voice recognition software. Every effort was made to ensure accuracy; however, inadvertent computerized transcription errors may be present    Lorin Glass, PA-C 10/13/21 1443

## 2021-10-13 NOTE — ED Notes (Signed)
Pt spitting into emesis bag.

## 2021-10-13 NOTE — ED Notes (Signed)
Pt moaning in hallway. Says she is still hurting and can this RN please let her nurse know. Primary RN made aware and message sent to provider.

## 2021-10-13 NOTE — ED Notes (Signed)
Pt taken off of ATC prior to d/c home. Pt respiratory status stable on RA w/no distress noted at this time.

## 2021-10-14 ENCOUNTER — Inpatient Hospital Stay (HOSPITAL_COMMUNITY)
Admission: EM | Admit: 2021-10-14 | Discharge: 2021-10-18 | DRG: 392 | Disposition: A | Discharge status: Home / Self Care | Payer: 59 | Attending: Family Medicine | Admitting: Family Medicine

## 2021-10-14 ENCOUNTER — Emergency Department (HOSPITAL_COMMUNITY): Payer: 59

## 2021-10-14 ENCOUNTER — Telehealth: Payer: Self-pay | Admitting: *Deleted

## 2021-10-14 ENCOUNTER — Telehealth: Payer: Self-pay | Admitting: Internal Medicine

## 2021-10-14 DIAGNOSIS — E1122 Type 2 diabetes mellitus with diabetic chronic kidney disease: Secondary | ICD-10-CM | POA: Diagnosis present

## 2021-10-14 DIAGNOSIS — N1831 Chronic kidney disease, stage 3a: Secondary | ICD-10-CM | POA: Diagnosis present

## 2021-10-14 DIAGNOSIS — Z8616 Personal history of COVID-19: Secondary | ICD-10-CM

## 2021-10-14 DIAGNOSIS — Z9049 Acquired absence of other specified parts of digestive tract: Secondary | ICD-10-CM

## 2021-10-14 DIAGNOSIS — Z981 Arthrodesis status: Secondary | ICD-10-CM

## 2021-10-14 DIAGNOSIS — Z888 Allergy status to other drugs, medicaments and biological substances status: Secondary | ICD-10-CM

## 2021-10-14 DIAGNOSIS — J398 Other specified diseases of upper respiratory tract: Secondary | ICD-10-CM | POA: Diagnosis present

## 2021-10-14 DIAGNOSIS — G4733 Obstructive sleep apnea (adult) (pediatric): Secondary | ICD-10-CM | POA: Diagnosis present

## 2021-10-14 DIAGNOSIS — G894 Chronic pain syndrome: Secondary | ICD-10-CM | POA: Diagnosis present

## 2021-10-14 DIAGNOSIS — F411 Generalized anxiety disorder: Secondary | ICD-10-CM | POA: Diagnosis present

## 2021-10-14 DIAGNOSIS — I5032 Chronic diastolic (congestive) heart failure: Secondary | ICD-10-CM | POA: Diagnosis present

## 2021-10-14 DIAGNOSIS — R9431 Abnormal electrocardiogram [ECG] [EKG]: Secondary | ICD-10-CM | POA: Diagnosis present

## 2021-10-14 DIAGNOSIS — Z79899 Other long term (current) drug therapy: Secondary | ICD-10-CM

## 2021-10-14 DIAGNOSIS — Z90710 Acquired absence of both cervix and uterus: Secondary | ICD-10-CM

## 2021-10-14 DIAGNOSIS — K2990 Gastroduodenitis, unspecified, without bleeding: Secondary | ICD-10-CM

## 2021-10-14 DIAGNOSIS — E109 Type 1 diabetes mellitus without complications: Secondary | ICD-10-CM

## 2021-10-14 DIAGNOSIS — R809 Proteinuria, unspecified: Secondary | ICD-10-CM | POA: Diagnosis present

## 2021-10-14 DIAGNOSIS — Z794 Long term (current) use of insulin: Secondary | ICD-10-CM

## 2021-10-14 DIAGNOSIS — J449 Chronic obstructive pulmonary disease, unspecified: Secondary | ICD-10-CM | POA: Diagnosis present

## 2021-10-14 DIAGNOSIS — R1013 Epigastric pain: Secondary | ICD-10-CM

## 2021-10-14 DIAGNOSIS — Z825 Family history of asthma and other chronic lower respiratory diseases: Secondary | ICD-10-CM

## 2021-10-14 DIAGNOSIS — K920 Hematemesis: Secondary | ICD-10-CM | POA: Diagnosis not present

## 2021-10-14 DIAGNOSIS — Z20822 Contact with and (suspected) exposure to covid-19: Secondary | ICD-10-CM | POA: Diagnosis present

## 2021-10-14 DIAGNOSIS — Z930 Tracheostomy status: Secondary | ICD-10-CM

## 2021-10-14 DIAGNOSIS — R1110 Vomiting, unspecified: Secondary | ICD-10-CM | POA: Diagnosis present

## 2021-10-14 DIAGNOSIS — Z8261 Family history of arthritis: Secondary | ICD-10-CM

## 2021-10-14 DIAGNOSIS — Z823 Family history of stroke: Secondary | ICD-10-CM

## 2021-10-14 DIAGNOSIS — E119 Type 2 diabetes mellitus without complications: Secondary | ICD-10-CM

## 2021-10-14 DIAGNOSIS — R1010 Upper abdominal pain, unspecified: Secondary | ICD-10-CM | POA: Diagnosis not present

## 2021-10-14 DIAGNOSIS — Z87891 Personal history of nicotine dependence: Secondary | ICD-10-CM

## 2021-10-14 DIAGNOSIS — I161 Hypertensive emergency: Secondary | ICD-10-CM | POA: Diagnosis present

## 2021-10-14 DIAGNOSIS — K2100 Gastro-esophageal reflux disease with esophagitis, without bleeding: Secondary | ICD-10-CM | POA: Diagnosis present

## 2021-10-14 DIAGNOSIS — R109 Unspecified abdominal pain: Secondary | ICD-10-CM

## 2021-10-14 DIAGNOSIS — E876 Hypokalemia: Secondary | ICD-10-CM | POA: Diagnosis not present

## 2021-10-14 DIAGNOSIS — J386 Stenosis of larynx: Secondary | ICD-10-CM | POA: Diagnosis present

## 2021-10-14 DIAGNOSIS — E785 Hyperlipidemia, unspecified: Secondary | ICD-10-CM | POA: Diagnosis present

## 2021-10-14 DIAGNOSIS — F32A Depression, unspecified: Secondary | ICD-10-CM | POA: Diagnosis present

## 2021-10-14 DIAGNOSIS — Z7982 Long term (current) use of aspirin: Secondary | ICD-10-CM

## 2021-10-14 DIAGNOSIS — K2970 Gastritis, unspecified, without bleeding: Principal | ICD-10-CM | POA: Diagnosis present

## 2021-10-14 DIAGNOSIS — R933 Abnormal findings on diagnostic imaging of other parts of digestive tract: Secondary | ICD-10-CM

## 2021-10-14 DIAGNOSIS — Z8249 Family history of ischemic heart disease and other diseases of the circulatory system: Secondary | ICD-10-CM

## 2021-10-14 DIAGNOSIS — Z6841 Body Mass Index (BMI) 40.0 and over, adult: Secondary | ICD-10-CM

## 2021-10-14 DIAGNOSIS — R112 Nausea with vomiting, unspecified: Secondary | ICD-10-CM

## 2021-10-14 DIAGNOSIS — I130 Hypertensive heart and chronic kidney disease with heart failure and stage 1 through stage 4 chronic kidney disease, or unspecified chronic kidney disease: Secondary | ICD-10-CM | POA: Diagnosis present

## 2021-10-14 DIAGNOSIS — Z7984 Long term (current) use of oral hypoglycemic drugs: Secondary | ICD-10-CM

## 2021-10-14 DIAGNOSIS — K760 Fatty (change of) liver, not elsewhere classified: Secondary | ICD-10-CM | POA: Diagnosis present

## 2021-10-14 DIAGNOSIS — K7689 Other specified diseases of liver: Secondary | ICD-10-CM | POA: Diagnosis present

## 2021-10-14 DIAGNOSIS — Z833 Family history of diabetes mellitus: Secondary | ICD-10-CM

## 2021-10-14 DIAGNOSIS — E871 Hypo-osmolality and hyponatremia: Secondary | ICD-10-CM | POA: Diagnosis not present

## 2021-10-14 DIAGNOSIS — R0789 Other chest pain: Secondary | ICD-10-CM

## 2021-10-14 DIAGNOSIS — K31A0 Gastric intestinal metaplasia, unspecified: Secondary | ICD-10-CM | POA: Diagnosis present

## 2021-10-14 HISTORY — DX: Hematemesis: K92.0

## 2021-10-14 LAB — CBC
HCT: 38 % (ref 36.0–46.0)
Hemoglobin: 12.3 g/dL (ref 12.0–15.0)
MCH: 27.1 pg (ref 26.0–34.0)
MCHC: 32.4 g/dL (ref 30.0–36.0)
MCV: 83.7 fL (ref 80.0–100.0)
Platelets: 384 10*3/uL (ref 150–400)
RBC: 4.54 MIL/uL (ref 3.87–5.11)
RDW: 14.3 % (ref 11.5–15.5)
WBC: 11 10*3/uL — ABNORMAL HIGH (ref 4.0–10.5)
nRBC: 0 % (ref 0.0–0.2)

## 2021-10-14 LAB — URINALYSIS, ROUTINE W REFLEX MICROSCOPIC
Bacteria, UA: NONE SEEN
Bilirubin Urine: NEGATIVE
Glucose, UA: NEGATIVE mg/dL
Hgb urine dipstick: NEGATIVE
Ketones, ur: 5 mg/dL — AB
Leukocytes,Ua: NEGATIVE
Nitrite: NEGATIVE
Protein, ur: 100 mg/dL — AB
Specific Gravity, Urine: 1.017 (ref 1.005–1.030)
pH: 6 (ref 5.0–8.0)

## 2021-10-14 LAB — COMPREHENSIVE METABOLIC PANEL
ALT: 28 U/L (ref 0–44)
AST: 46 U/L — ABNORMAL HIGH (ref 15–41)
Albumin: 4 g/dL (ref 3.5–5.0)
Alkaline Phosphatase: 111 U/L (ref 38–126)
Anion gap: 14 (ref 5–15)
BUN: 13 mg/dL (ref 6–20)
CO2: 22 mmol/L (ref 22–32)
Calcium: 9.3 mg/dL (ref 8.9–10.3)
Chloride: 102 mmol/L (ref 98–111)
Creatinine, Ser: 1.31 mg/dL — ABNORMAL HIGH (ref 0.44–1.00)
GFR, Estimated: 49 mL/min — ABNORMAL LOW (ref >60)
Glucose, Bld: 107 mg/dL — ABNORMAL HIGH (ref 70–99)
Potassium: 4.4 mmol/L (ref 3.5–5.1)
Sodium: 138 mmol/L (ref 135–145)
Total Bilirubin: 2.1 mg/dL — ABNORMAL HIGH (ref 0.3–1.2)
Total Protein: 8.4 g/dL — ABNORMAL HIGH (ref 6.5–8.1)

## 2021-10-14 LAB — PROTIME-INR
INR: 1 (ref 0.8–1.2)
Prothrombin Time: 13.3 seconds (ref 11.4–15.2)

## 2021-10-14 LAB — LACTIC ACID, PLASMA: Lactic Acid, Venous: 1.8 mmol/L (ref 0.5–1.9)

## 2021-10-14 LAB — TYPE AND SCREEN
ABO/RH(D): B POS
Antibody Screen: NEGATIVE

## 2021-10-14 LAB — CBG MONITORING, ED: Glucose-Capillary: 99 mg/dL (ref 70–99)

## 2021-10-14 LAB — BILIRUBIN, DIRECT: Bilirubin, Direct: 0.1 mg/dL (ref 0.0–0.2)

## 2021-10-14 LAB — MAGNESIUM: Magnesium: 2 mg/dL (ref 1.7–2.4)

## 2021-10-14 LAB — TROPONIN I (HIGH SENSITIVITY)
Troponin I (High Sensitivity): 5 ng/L (ref <18)
Troponin I (High Sensitivity): 6 ng/L (ref <18)

## 2021-10-14 LAB — LIPASE, BLOOD: Lipase: 39 U/L (ref 11–51)

## 2021-10-14 MED ORDER — ONDANSETRON HCL 4 MG/2ML IJ SOLN
4.0000 mg | Freq: Once | INTRAMUSCULAR | Status: AC
  Administered 2021-10-14: 4 mg via INTRAVENOUS
  Filled 2021-10-14: qty 2

## 2021-10-14 MED ORDER — ATORVASTATIN CALCIUM 10 MG PO TABS
20.0000 mg | ORAL_TABLET | Freq: Every day | ORAL | Status: DC
  Administered 2021-10-15 – 2021-10-18 (×4): 20 mg via ORAL
  Filled 2021-10-14 (×5): qty 2

## 2021-10-14 MED ORDER — MELATONIN 3 MG PO TABS
3.0000 mg | ORAL_TABLET | Freq: Every day | ORAL | Status: DC
  Administered 2021-10-14 – 2021-10-17 (×4): 3 mg via ORAL
  Filled 2021-10-14 (×4): qty 1

## 2021-10-14 MED ORDER — MOMETASONE FURO-FORMOTEROL FUM 200-5 MCG/ACT IN AERO
2.0000 | INHALATION_SPRAY | Freq: Every day | RESPIRATORY_TRACT | Status: DC
  Filled 2021-10-14: qty 8.8

## 2021-10-14 MED ORDER — SODIUM CHLORIDE 0.9 % IV SOLN: INTRAVENOUS | Status: DC

## 2021-10-14 MED ORDER — HYDROMORPHONE HCL 1 MG/ML IJ SOLN
0.5000 mg | Freq: Once | INTRAMUSCULAR | Status: AC
  Administered 2021-10-14: 0.5 mg via INTRAVENOUS
  Filled 2021-10-14: qty 1

## 2021-10-14 MED ORDER — LACTATED RINGERS IV BOLUS: 1000.0000 mL | Freq: Once | INTRAVENOUS | Status: DC

## 2021-10-14 MED ORDER — INSULIN ASPART 100 UNIT/ML IJ SOLN
0.0000 [IU] | INTRAMUSCULAR | Status: DC
  Administered 2021-10-17: 2 [IU] via SUBCUTANEOUS
  Administered 2021-10-17 (×2): 3 [IU] via SUBCUTANEOUS
  Administered 2021-10-18: 2 [IU] via SUBCUTANEOUS

## 2021-10-14 MED ORDER — BUPROPION HCL ER (SR) 150 MG PO TB12
150.0000 mg | ORAL_TABLET | Freq: Two times a day (BID) | ORAL | Status: DC
Start: 2021-10-14 — End: 2021-10-18
  Administered 2021-10-14 – 2021-10-18 (×8): 150 mg via ORAL
  Filled 2021-10-14 (×8): qty 1

## 2021-10-14 MED ORDER — GABAPENTIN 300 MG PO CAPS
300.0000 mg | ORAL_CAPSULE | Freq: Two times a day (BID) | ORAL | Status: DC
  Administered 2021-10-15 – 2021-10-18 (×7): 300 mg via ORAL
  Filled 2021-10-14 (×7): qty 1

## 2021-10-14 MED ORDER — LORAZEPAM 2 MG/ML IJ SOLN
1.0000 mg | Freq: Once | INTRAMUSCULAR | Status: AC
  Administered 2021-10-14: 1 mg via INTRAVENOUS
  Filled 2021-10-14: qty 1

## 2021-10-14 MED ORDER — ONDANSETRON HCL 4 MG/2ML IJ SOLN
4.0000 mg | Freq: Four times a day (QID) | INTRAMUSCULAR | Status: DC
  Administered 2021-10-14 – 2021-10-18 (×14): 4 mg via INTRAVENOUS
  Filled 2021-10-14 (×14): qty 2

## 2021-10-14 MED ORDER — DOCUSATE SODIUM 100 MG PO CAPS
100.0000 mg | ORAL_CAPSULE | Freq: Two times a day (BID) | ORAL | Status: DC
  Administered 2021-10-14 – 2021-10-16 (×3): 100 mg via ORAL
  Filled 2021-10-14 (×6): qty 1

## 2021-10-14 MED ORDER — POLYETHYLENE GLYCOL 3350 17 G PO PACK: 17.0000 g | PACK | Freq: Every day | ORAL | Status: DC | PRN

## 2021-10-14 MED ORDER — LABETALOL HCL 5 MG/ML IV SOLN
20.0000 mg | Freq: Four times a day (QID) | INTRAVENOUS | Status: DC
  Administered 2021-10-14 – 2021-10-15 (×3): 20 mg via INTRAVENOUS
  Filled 2021-10-14 (×3): qty 4

## 2021-10-14 MED ORDER — SODIUM CHLORIDE 0.9 % IV BOLUS
1000.0000 mL | Freq: Once | INTRAVENOUS | Status: AC
  Administered 2021-10-14: 1000 mL via INTRAVENOUS

## 2021-10-14 MED ORDER — ASPIRIN EC 81 MG PO TBEC
81.0000 mg | DELAYED_RELEASE_TABLET | Freq: Every day | ORAL | Status: DC
Start: 2021-10-14 — End: 2021-10-18
  Administered 2021-10-14 – 2021-10-18 (×5): 81 mg via ORAL
  Filled 2021-10-14 (×5): qty 1

## 2021-10-14 MED ORDER — ALBUTEROL SULFATE (2.5 MG/3ML) 0.083% IN NEBU: 2.5000 mg | INHALATION_SOLUTION | Freq: Four times a day (QID) | RESPIRATORY_TRACT | Status: DC | PRN

## 2021-10-14 MED ORDER — PANTOPRAZOLE 80MG IVPB - SIMPLE MED
80.0000 mg | Freq: Once | INTRAVENOUS | Status: AC
  Administered 2021-10-14: 80 mg via INTRAVENOUS
  Filled 2021-10-14: qty 80

## 2021-10-14 MED ORDER — GABAPENTIN 300 MG PO CAPS
600.0000 mg | ORAL_CAPSULE | Freq: Two times a day (BID) | ORAL | Status: DC
  Filled 2021-10-14: qty 2

## 2021-10-14 MED ORDER — SERTRALINE HCL 100 MG PO TABS
100.0000 mg | ORAL_TABLET | Freq: Every day | ORAL | Status: DC
Start: 2021-10-14 — End: 2021-10-18
  Administered 2021-10-14 – 2021-10-17 (×4): 100 mg via ORAL
  Filled 2021-10-14 (×4): qty 1

## 2021-10-14 MED ORDER — ACETAMINOPHEN 10 MG/ML IV SOLN
1000.0000 mg | Freq: Four times a day (QID) | INTRAVENOUS | Status: DC | PRN
  Administered 2021-10-14 – 2021-10-15 (×2): 1000 mg via INTRAVENOUS
  Filled 2021-10-14 (×2): qty 100

## 2021-10-14 MED ORDER — PANTOPRAZOLE INFUSION (NEW) - SIMPLE MED
8.0000 mg/h | INTRAVENOUS | Status: DC
  Administered 2021-10-14 – 2021-10-15 (×2): 8 mg/h via INTRAVENOUS
  Filled 2021-10-14 (×2): qty 80
  Filled 2021-10-14: qty 100

## 2021-10-14 MED ORDER — SUCRALFATE 1 GM/10ML PO SUSP
1.0000 g | Freq: Four times a day (QID) | ORAL | Status: DC
  Administered 2021-10-14 – 2021-10-18 (×14): 1 g via ORAL
  Filled 2021-10-14 (×16): qty 10

## 2021-10-14 MED ORDER — DICYCLOMINE HCL 20 MG PO TABS
20.0000 mg | ORAL_TABLET | Freq: Two times a day (BID) | ORAL | Status: DC
  Administered 2021-10-14 – 2021-10-18 (×8): 20 mg via ORAL
  Filled 2021-10-14 (×9): qty 1

## 2021-10-14 MED ORDER — DROPERIDOL 2.5 MG/ML IJ SOLN
2.5000 mg | Freq: Once | INTRAMUSCULAR | Status: AC
  Administered 2021-10-14: 2.5 mg via INTRAVENOUS
  Filled 2021-10-14: qty 2

## 2021-10-14 MED ORDER — LORATADINE 10 MG PO TABS
10.0000 mg | ORAL_TABLET | Freq: Every day | ORAL | Status: DC
  Administered 2021-10-15 – 2021-10-18 (×4): 10 mg via ORAL
  Filled 2021-10-14 (×5): qty 1

## 2021-10-14 MED ORDER — LISINOPRIL 20 MG PO TABS
20.0000 mg | ORAL_TABLET | Freq: Every day | ORAL | Status: DC
  Administered 2021-10-14 – 2021-10-17 (×4): 20 mg via ORAL
  Filled 2021-10-14 (×4): qty 1

## 2021-10-14 NOTE — ED Provider Notes (Addendum)
[facility] EMERGENCY DEPARTMENT Provider Note   CSN: [id number] Arrival date & time: 10/14/21  1016     History  Chief Complaint  Patient presents with   Abdominal Pain    [name] is a 51 y.o. female.   Abdominal Pain Associated symptoms: nausea and vomiting    51 year old female with medical history significant for tracheostomy, subglottic stenosis, chronic abdominal pain, HTN, depression, GERD, HLD, COPD, OSA not on CPAP, DM2, hiatal hernia who presents emergency department with a chief complaint of uncontrolled nausea and vomiting.  She has been seen 4 times in the emergency department over the past week for similar complaints.  She states that symptoms have not improved although every time they did improve somewhat upon presentation to the emergency department to the point where she was sent home.  Today, she now complains of multiple episodes of coffee-ground emesis that occurred overnight.  She endorses severe burning epigastric abdominal pain.  She endorses persistent nausea and vomiting and has not been able to tolerate any oral intake.   Home Medications Prior to Admission medications   Medication Sig Start Date End Date Taking? Authorizing Provider  albuterol (PROAIR HFA) 108 (90 Base) MCG/ACT inhaler Inhale 2 puffs into the lungs every 6 (six) hours as needed for wheezing or shortness of breath. 09/24/21  Yes [name], MD  albuterol (PROVENTIL) (2.5 MG/3ML) 0.083% nebulizer solution Take 3 mLs (2.5 mg total) by nebulization every 6 (six) hours as needed for wheezing or shortness of breath. 09/24/21  Yes [name], MD  aspirin EC 81 MG EC tablet Take 1 tablet (81 mg total) by mouth daily. Swallow whole. 08/27/21  Yes Jessy Oto, MD  atorvastatin (LIPITOR) 20 MG tablet Take 1 tablet (20 mg total) by mouth daily. 09/24/21  Yes [name], MD  buPROPion (WELLBUTRIN SR) 150 MG 12 hr tablet Take 1 tablet (150 mg total) by  mouth 2 (two) times daily. 09/24/21  Yes [name], MD  cetirizine (ZYRTEC) 10 MG tablet Take 1 tablet (10 mg total) by mouth at bedtime. 03/06/21  Yes Spero Geralds, MD  dapagliflozin propanediol (FARXIGA) 5 MG TABS tablet Take 1 tablet (5 mg total) by mouth daily before breakfast. 11/30/20  Yes Newlin, Enobong, MD  diclofenac Sodium (VOLTAREN) 1 % GEL Apply 4 g topically 4 (four) times daily. 11/27/20  Yes [name], MD  dicyclomine (BENTYL) 20 MG tablet Take 1 tablet (20 mg total) by mouth 2 (two) times daily. 10/11/21  Yes Prosperi, Christian H, PA-C  docusate sodium (COLACE) 100 MG capsule Take 1 capsule (100 mg total) by mouth 2 (two) times daily. 08/26/21  Yes Jessy Oto, MD  furosemide (LASIX) 20 MG tablet Take 1 tablet (20 mg total) by mouth daily. Patient taking differently: Take 20 mg by mouth daily as needed for fluid or edema. 03/31/19  Yes [name], MD  gabapentin (NEURONTIN) 300 MG capsule Take 2 capsules (600 mg total) by mouth 2 (two) times daily. 09/24/21  Yes [name], MD  hydrOXYzine (ATARAX/VISTARIL) 25 MG tablet Take 1 tablet (25 mg total) by mouth 3 (three) times daily as needed for anxiety. 11/27/20  Yes Newlin, Charlane Ferretti, MD  insulin degludec (TRESIBA FLEXTOUCH) 200 UNIT/ML FlexTouch Pen Inject 156 Units into the skin daily. Patient taking differently: Inject 155 Units into the skin at bedtime. 07/26/21  Yes Renato Shin, MD  lisinopril (ZESTRIL) 20 MG tablet Take 1 tablet (20 mg total) by mouth daily. 09/24/21  Yes [name], MD  Melatonin 3 MG TABS Take 3 mg by mouth at bedtime. 10/31/17  Yes [provider]  methocarbamol (ROBAXIN) 750 MG tablet Take 1 tablet (750 mg total) by mouth every 8 (eight) hours as needed for muscle spasms. 09/17/21  Yes Newlin, Charlane Ferretti, MD  mometasone-formoterol (DULERA) 200-5 MCG/ACT AERO Inhale 2 puffs into the lungs in the morning and at bedtime. 07/11/20  Yes Newlin, Enobong, MD  ondansetron (ZOFRAN) 4 MG tablet  TAKE 1 TABLET BY MOUTH EVERY 8 HOURS AS NEEDED FOR NAUSEA OR VOMITING Patient taking differently: Take 4 mg by mouth every 8 (eight) hours as needed for nausea or vomiting. 07/16/21  Yes Newlin, Charlane Ferretti, MD  pantoprazole (PROTONIX) 20 MG tablet Take 1 tablet (20 mg total) by mouth daily. 10/11/21  Yes Prosperi, Christian H, PA-C  polyethylene glycol (MIRALAX / GLYCOLAX) 17 g packet Take 17 g by mouth daily as needed for mild constipation. 08/26/21  Yes Jessy Oto, MD  Semaglutide,0.25 or 0.5MG/DOS, (OZEMPIC, 0.25 OR 0.5 MG/DOSE,) 2 MG/1.5ML SOPN Inject 0.5 mg into the skin once a week. 04/10/21  Yes Renato Shin, MD  sertraline (ZOLOFT) 100 MG tablet Take 1 tablet (100 mg total) by mouth at bedtime. 09/24/21  Yes [name], MD  silver sulfADIAZINE (SILVADENE) 1 % cream Apply 1 application topically daily. 02/28/21  Yes [name], MD  sucralfate (CARAFATE) 1 g tablet Take 1 tablet (1 g total) by mouth 4 (four) times daily -  with meals and at bedtime. 10/13/21  Yes Jacqlyn Larsen, PA-C  SUMAtriptan (IMITREX) 100 MG tablet At the onset of a migraine; may repeat in 2 hours. Max daily dose 267m Patient taking differently: Take 100 mg by mouth every 2 (two) hours as needed for migraine. 09/24/21  Yes NCharlott Rakes MD  Blood Glucose Monitoring Suppl (ONETOUCH VERIO) w/Device KIT 1 each by Does not apply route daily. Use to monitor glucose levels daily; E11.9 01/25/21   ERenato Shin MD  glucose blood ([facility]) test strip Use to monitor glucose levels daily; E11.9 10/04/20   ERenato Shin MD  Insulin Pen Needle 31G X 8 MM MISC Inject 1 Units as directed daily. 07/30/18   [provider]  Misc. Devices MISC Nebulizer device. Diagnosis - Asthma 07/16/20   NCharlott Rakes MD  Misc. Devices ME[employer]  Diagnosis-low back pain 01/23/21   NCharlott Rakes MD  Misc. Devices MQuincychair 04/03/21   NCharlott Rakes MD  Misc. Devices MISC Bedside commode. Diagnosis- low back pain 09/02/21    NCharlott Rakes MD  ondansetron (ZOFRAN-ODT) 4 MG disintegrating tablet 467mODT q4 hours prn nausea/vomit Patient not taking: Reported on 10/14/2021 10/13/21   FoJacqlyn LarsenPA-C  OneTouch Delica Lancets [id number] 1 each by Does not apply route daily. Use to monitor glucose levels daily; E11.9 04/14/19   ElRenato ShinMD  pantoprazole (PROTONIX) 40 MG tablet Take 1 tablet (40 mg total) by mouth 2 (two) times daily. Patient taking differently: Take 40 mg by mouth daily as needed (heartburn). 04/30/21   NeCharlott RakesMD  topiramate (TOPAMAX) 100 MG tablet Take 1.5 tablets (150 mg total) by mouth 2 (two) times daily. Patient not taking: Reported on 10/14/2021 09/24/21   NeCharlott RakesMD  fluticasone-salmeterol (ADVAIR HFA) 230-21 MCG/ACT inhaler Inhale 2 puffs into the lungs 2 (two) times daily. 10/15/17 10/15/17  NeCharlott RakesMD      Allergies    Reglan [metoclopramide]    Review of Systems  Review of Systems  Gastrointestinal:  Positive for abdominal pain, nausea and vomiting.  All other systems reviewed and are negative.  Physical Exam Updated Vital Signs BP (!) 194/132    Pulse 97    Temp 98.9 F (37.2 C) (Oral)    Resp (!) 30    LMP  (LMP Unknown)    SpO2 99%  Physical Exam Vitals and nursing note reviewed.  Constitutional:      General: She is in acute distress.     Appearance: She is well-developed. She is ill-appearing.  HENT:     Head: Normocephalic and atraumatic.     Mouth/Throat:     Mouth: Mucous membranes are dry.  Eyes:     Conjunctiva/sclera: Conjunctivae normal.     Pupils: Pupils are equal, round, and reactive to light.  Cardiovascular:     Rate and Rhythm: Regular rhythm. Tachycardia present.     Heart sounds: No murmur heard. Pulmonary:     Effort: Pulmonary effort is normal. No respiratory distress.     Breath sounds: Normal breath sounds.  Abdominal:     General: There is no distension.     Palpations: Abdomen is soft.     Tenderness: There is  abdominal tenderness in the epigastric area and left upper quadrant. There is no guarding.  Musculoskeletal:        General: No swelling, deformity or signs of injury.     Cervical back: Neck supple.  Skin:    General: Skin is warm and dry.     Capillary Refill: Capillary refill takes less than 2 seconds.     Findings: No lesion or rash.  Neurological:     General: No focal deficit present.     Mental Status: She is alert. Mental status is at baseline.  Psychiatric:        Mood and Affect: Mood normal.    ED Results / Procedures / Treatments   Labs (all labs ordered are listed, but only abnormal results are displayed) Labs Reviewed  COMPREHENSIVE METABOLIC PANEL - Abnormal; Notable for the following components:      Result Value   Glucose, Bld 107 (*)    Creatinine, Ser 1.31 (*)    Total Protein 8.4 (*)    AST 46 (*)    Total Bilirubin 2.1 (*)    GFR, Estimated 49 (*)    All other components within normal limits  CBC - Abnormal; Notable for the following components:   WBC 11.0 (*)    All other components within normal limits  URINALYSIS, ROUTINE W REFLEX MICROSCOPIC - Abnormal; Notable for the following components:   Ketones, ur 5 (*)    Protein, ur 100 (*)    All other components within normal limits  RESP PANEL BY RT-PCR (FLU A&B, COVID) ARPGX2  LIPASE, BLOOD  BILIRUBIN, DIRECT  MAGNESIUM  PROTIME-INR  LACTIC ACID, PLASMA  LACTIC ACID, PLASMA  CBC  HIV ANTIBODY (ROUTINE TESTING W REFLEX)  BASIC METABOLIC PANEL  HEMOGLOBIN A1C  CBG MONITORING, ED  TYPE AND SCREEN  TROPONIN I (HIGH SENSITIVITY)  TROPONIN I (HIGH SENSITIVITY)    EKG EKG Interpretation  Date/Time:  Monday October 14 2021 17:43:18 EST Ventricular Rate:  97 PR Interval:  170 QRS Duration: 94 QT Interval:  391 QTC Calculation: 497 R Axis:   -2 Text Interpretation: Sinus rhythm Probable left atrial enlargement Borderline T abnormalities, anterior leads Borderline prolonged QT interval Confirmed  by Regan Lemming ([id number]) on 10/14/2021 5:46:40 PM  Radiology  DG Chest Portable 1 View  Result Date: 10/14/2021 CLINICAL DATA:  Epigastric pain EXAM: PORTABLE CHEST 1 VIEW COMPARISON:  08/22/2021 FINDINGS: Stable tracheostomy tube. Stable heart size. Low lung volumes. No focal airspace consolidation, pleural effusion, or pneumothorax. IMPRESSION: No active disease. Electronically Signed   By: Davina Poke D.O.   On: 10/14/2021 15:39   DG Abdomen Acute W/Chest  Result Date: 10/13/2021 CLINICAL DATA:  Abdominal pain. EXAM: DG ABDOMEN ACUTE WITH 1 VIEW CHEST COMPARISON:  CTA chest abdomen pelvis 10/12/2021 FINDINGS: Tracheostomy tube noted. Low lung volumes without edema or focal consolidation. No pleural effusion. Cardiopericardial silhouette is at upper limits of normal for size. The visualized bony structures of the thorax show no acute abnormality. Upright abdominal film does not include the hemidiaphragms no evidence for free air on the chest x-ray. There is no evidence for gaseous bowel dilation to suggest obstruction. Fixation hardware noted lower lumbar spine. Surgical clips in the right upper quadrant suggest prior cholecystectomy IMPRESSION: 1. No acute cardiopulmonary findings. 2. No evidence for bowel perforation or obstruction. Electronically Signed   By: Misty Stanley M.D.   On: 10/13/2021 15:34   CT Angio Chest/Abd/Pel for Dissection W and/or Wo Contrast  Result Date: 10/12/2021 CLINICAL DATA:  Chest and back pain.  Concern for aortic syndrome. EXAM: CT ANGIOGRAPHY CHEST, ABDOMEN AND PELVIS TECHNIQUE: Non-contrast CT of the chest was initially obtained. Multidetector CT imaging through the chest, abdomen and pelvis was performed using the standard protocol during bolus administration of intravenous contrast. Multiplanar reconstructed images and MIPs were obtained and reviewed to evaluate the vascular anatomy. RADIATION DOSE REDUCTION: This exam was performed according to the departmental  dose-optimization program which includes automated exposure control, adjustment of the mA and/or kV according to patient size and/or use of iterative reconstruction technique. CONTRAST:  155m OMNIPAQUE IOHEXOL 350 MG/ML SOLN COMPARISON:  CT abdomen pelvis dated 10/11/2021 FINDINGS: CTA CHEST FINDINGS Cardiovascular: There is no cardiomegaly or pericardial effusion. The thoracic aorta is unremarkable. The origins of the great vessels of the aortic arch appear patent. The central pulmonary arteries are unremarkable. Mediastinum/Nodes: No hilar or mediastinal adenopathy. The esophagus is grossly unremarkable. No mediastinal fluid collection. Lungs/Pleura: The lungs are clear. There is no pleural effusion pneumothorax. The central airways are patent. Tracheostomy with tip approximately 5.5 cm above the carina. Musculoskeletal: No acute osseous pathology. Review of the MIP images confirms the above findings. CTA ABDOMEN AND PELVIS FINDINGS VASCULAR Aorta: Normal caliber aorta without aneurysm, dissection, vasculitis or significant stenosis. Celiac: Patent without evidence of aneurysm, dissection, vasculitis or significant stenosis. SMA: Patent without evidence of aneurysm, dissection, vasculitis or significant stenosis. Renals: Both renal arteries are patent without evidence of aneurysm, dissection, vasculitis, fibromuscular dysplasia or significant stenosis. IMA: Patent without evidence of aneurysm, dissection, vasculitis or significant stenosis. Inflow: Patent without evidence of aneurysm, dissection, vasculitis or significant stenosis. Veins: No obvious venous abnormality within the limitations of this arterial phase study. Review of the MIP images confirms the above findings. NON-VASCULAR No intra-abdominal free air or free fluid. Hepatobiliary: Fatty liver. No intrahepatic biliary ductal dilatation. Cholecystectomy. Pancreas: Unremarkable. No pancreatic ductal dilatation or surrounding inflammatory changes. Spleen:  Normal in size without focal abnormality. Adrenals/Urinary Tract: Left adrenal thickening or hyperplasia. The right adrenal gland is unremarkable. There is no hydronephrosis on either side. The visualized ureters and urinary bladder appear unremarkable. Stomach/Bowel: No bowel obstruction or active inflammation. The appendix is not visualized with certainty. No inflammatory changes identified in the right lower quadrant. Lymphatic:  No adenopathy. Reproductive: Hysterectomy. Asymmetric enlargement of the left ovary. Similar appearance of a 2 cm fluid density ovoid lesion in the anterior pelvis to the right of the midline. Other: Midline vertical anterior abdominal wall incisional scar. Musculoskeletal: Lower lumbar posterior fusion and disc spacer. No acute osseous pathology. Review of the MIP images confirms the above findings. IMPRESSION: 1. No acute intrathoracic, abdominal, or pelvic pathology. No aortic dissection or aneurysm. 2. Fatty liver. Electronically Signed   By: Anner Crete M.D.   On: 10/12/2021 21:07   US Abdomen Limited RUQ (LIVER/GB)  Result Date: 10/14/2021 CLINICAL DATA:  Abdominal pain, nausea, vomiting EXAM: ULTRASOUND ABDOMEN LIMITED RIGHT UPPER QUADRANT COMPARISON:  Sonogram done on 02/28/2014, CT abdomen done on 10/11/2021 FINDINGS: Gallbladder: Gallbladder is not seen consistent with cholecystectomy. Common bile duct: Diameter: 7 mm Liver: There is increased echogenicity in the liver suggesting fatty infiltration. There is 1.7 cm cystic structure in the porta hepatis, possibly a hepatic cyst. This finding was not distinctly seen in the previous CT. Portal vein is patent on color Doppler imaging with normal direction of blood flow towards the liver. Other: None. IMPRESSION: Status post cholecystectomy.  Fatty liver. Electronically Signed   By: Elmer Picker M.D.   On: 10/14/2021 13:01    Procedures .Critical Care E&M Performed by: Regan Lemming, MD  Critical care provider  statement:    Critical care time (minutes):  32   Critical care was time spent personally by me on the following activities:  Development of treatment plan with patient or surrogate, discussions with consultants, examination of patient, obtaining history from patient or surrogate, ordering and review of laboratory studies, ordering and review of radiographic studies, pulse oximetry, re-evaluation of patient's condition and review of old charts   Care discussed with: admitting provider   After initial E/M assessment, critical care services were subsequently performed that were exclusive of separately billable procedures or treatment.      Medications Ordered in ED Medications  sodium chloride 0.9 % bolus 1,000 mL (1,000 mLs Intravenous New Bag/Given 10/14/21 1704)    And  0.9 %  sodium chloride infusion ( Intravenous New Bag/Given 10/14/21 1707)  pantoprozole (PROTONIX) 80 mg /NS 100 mL infusion (has no administration in time range)  sucralfate (CARAFATE) 1 GM/10ML suspension 1 g (has no administration in time range)  ondansetron (ZOFRAN) injection 4 mg (0 mg Intravenous Hold 10/14/21 1713)  acetaminophen (OFIRMEV) IV 1,000 mg (has no administration in time range)  0.9 %  sodium chloride infusion (has no administration in time range)  labetalol (NORMODYNE) injection 20 mg (has no administration in time range)  insulin aspart (novoLOG) injection 0-15 Units (has no administration in time range)  aspirin EC tablet 81 mg (has no administration in time range)  atorvastatin (LIPITOR) tablet 20 mg (has no administration in time range)  lisinopril (ZESTRIL) tablet 20 mg (has no administration in time range)  buPROPion (WELLBUTRIN SR) 12 hr tablet 150 mg (has no administration in time range)  sertraline (ZOLOFT) tablet 100 mg (has no administration in time range)  docusate sodium (COLACE) capsule 100 mg (has no administration in time range)  polyethylene glycol (MIRALAX / GLYCOLAX) packet 17 g (has no  administration in time range)  melatonin tablet 3 mg (has no administration in time range)  gabapentin (NEURONTIN) capsule 600 mg (has no administration in time range)  albuterol (PROVENTIL) (2.5 MG/3ML) 0.083% nebulizer solution 2.5 mg (has no administration in time range)  loratadine (CLARITIN) tablet 10 mg (has  no administration in time range)  mometasone-formoterol (DULERA) 200-5 MCG/ACT inhaler 2 puff (has no administration in time range)  dicyclomine (BENTYL) tablet 20 mg (has no administration in time range)  HYDROmorphone (DILAUDID) injection 0.5 mg (0.5 mg Intravenous Given 10/14/21 1147)  droperidol (INAPSINE) 2.5 MG/ML injection 2.5 mg (2.5 mg Intravenous Given 10/14/21 1641)  pantoprazole (PROTONIX) 80 mg /NS 100 mL IVPB (80 mg Intravenous New Bag/Given 10/14/21 1753)  ondansetron (ZOFRAN) injection 4 mg (4 mg Intravenous Given 10/14/21 1641)  LORazepam (ATIVAN) injection 1 mg (1 mg Intravenous Given 10/14/21 1641)    ED Course/ Medical Decision Making/ A&P                           Medical Decision Making Amount and/or Complexity of Data Reviewed Labs: ordered. Radiology: ordered.  Risk Prescription drug management. Decision regarding hospitalization.   51 year old female with medical history significant for tracheostomy, subglottic stenosis, chronic abdominal pain, HTN, depression, GERD, HLD, COPD, OSA not on CPAP, DM2, hiatal hernia who presents emergency department with a chief complaint of uncontrolled nausea and vomiting.  She has been seen 4 times in the emergency department over the past week for similar complaint.  She states that symptoms have not improved although every time they did improve somewhat upon presentation to the emergency department to the point where she was sent home.  Today, she now complains of multiple episodes of coffee-ground emesis that occurred overnight.  She endorses severe burning epigastric abdominal pain.  She endorses persistent nausea and  vomiting and has not been able to tolerate any oral intake.  On arrival, the patient was afebrile, low-grade temperature 99.4, tachycardic and hypertensive, P103, BP 214/123, subsequently 172/114 on recheck with no intervention.  Physical exam significant for an uncomfortable and ill-appearing female in apparent distress from persistent nausea and vomiting.  She has epigastric and left upper quadrant tenderness on exam and complains of searing and burning epigastric pain that radiates to her back.  She presents with multiple episodes of coffee-ground emesis.  Concern for possible Mallory-Weiss tear in the setting of the patient's nausea and vomiting versus possible peptic ulcer or other upper GI bleed.  The access was obtained and the patient was administered an IV fluid bolus for volume resuscitation and started on IV Protonix.  He was administered IV droperidol for nausea and vomiting, IV Zofran, IV Ativan and was status post IV Dilaudid for pain control.  Due to concern for acute upper GI bleed with multiple episodes of coffee-ground emesis, gastroenterology was consulted.  I spoke with Dr. Tarri Glenn of [place] who will come evaluate the patient.  A chest x-ray was performed revealed no evidence of esophageal perforation, no other acute cardiac or pulmonary abnormality.  A right upper quadrant ultrasound was significant for status postcholecystectomy with hepatic steatosis, no other abnormalities.  Patient had a laboratory work-up significant for normal lipase, CBC with a mild leukocytosis to 11, CMP without significant electrolyte abnormality, mildly elevated AST to 46, ALT to 28, T bili elevated to 2.1, direct bili pending, urinalysis with mild ketones and proteinuria.  Troponins and lactic acid were collected and pending.  Given the patient's coffee-ground emesis in the setting of her uncontrolled nausea and vomiting, will plan for admission for further work-up of an upper GI bleed.    Final  Clinical Impression(s) / ED Diagnoses Final diagnoses:  Abdominal pain  Coffee ground emesis  Nausea and vomiting, unspecified vomiting type  Rx / DC Orders ED Discharge Orders     None         Regan Lemming, MD 10/14/21 1645    Regan Lemming, MD 10/14/21 2005

## 2021-10-14 NOTE — H&P (Addendum)
[facility] Admission History and Physical Service Pager: [phone]  Patient name: [name] Medical record number: [id number] Date of birth: 06-01-1971 Age: 51 y.o. Gender: female  Primary Care Provider: [name], MD Consultants: GI  Code Status: Full Preferred Emergency Contact: [name] (spouse) [phone]  Chief Complaint: uncontrolled nausea and vomitting  Assessment and Plan: [name] is a 51 y.o. female presenting with coffee ground emesis and abdominal pain in setting of recurrent nausea and vomiting . PMH is significant for DM2, CKD, HTN, Tracheal stenosis s/p tracheostomy, OSA not on CPAP, obesity, chronic abdominal pain, GERD, HLD, COPD, HFpEF, Depression  Abdominal pain w/ coffee ground emesis Patient presents to ED for her 5th visit over the past week, with complaints of persistent nausea, vomiting, generalized abdominal pain, and now coffee ground emesis. On arrival, patient was afebrile with a pulse of 103, BP 214/123, with 100% O2 saturation. Patient received IV fluid bolus, protonix, droperidol, zofran, ativan, and dilaudid. Labs were significant for a normal lipase, Cr of 1.31, AST 46, T. Bili 2.1, GFR 49. WBC slightly elevated to 11.0, and Hgb 12.3. UA was positive for 5 ketones, and 100 protein. On imaging, RUQ u/s showed s/p cholecystectomy and fatty liver disease. CXR showed no active disease. CT angio chest/abd/pelv on 10/12/21 showed no acute intrathoracic, abdominal, or pelvic pathology, nor aortic dissection or aneurysm. CT abd/pelv w contrast on 10/11/21, showed no acute findings within the abdomen or pelvis. EKG was normal sinus rhythm without signs of ischemia. On physical exam she had no signs of pallor, normal respiratory exam, with distant heart sounds. Throat was pink and moist without signs of trauma. Given patients history of coffee ground emesis, GI was consulted in the ED.  Patient's coffee ground emesis are likely 2/2 to an acute upper GI bleed, given her history of vomiting, it may represent a Mallory Weis tear in her esophagus. Patient could also be suffering from a variceal bleed, but less likely given normal hgb, and coffee ground nature of emesis as opposed to frank blood. Patient could also have peptic ulcer given history of coffee ground emesis and epigastric abdominal pain. In her previous ED visits it was determined that her symptoms were secondary to gastroparesis.  -Admit to FPTS, Attending Dr. Sherren Mocha McDiarmid -Gastroenterology consult, appreciate recs -Vitals per unit -IV Protonix -NPO -IV Zofran 4 mg -IV protonix 8 mg/hr -Carafate 1 g oral q 6h -mIVF NS @ 144 ml/hr -AM CBC, BMP -AM Hgb A1c  -SCDs for VTE prophylaxis -f/u troponin  Hypertensive Emergency Patient BP on admission was 214/123 and patient was complaining of a headache for the last few hours. Patient denied any vision changes, and had chest pain associated with vomiting. Will lower BP with labetalol. Home medication: Lisinopril 14m daily -IV labetol 20 mg q6h x1 day and will reassess  - Continue lisinopril  -Continue to monitor  AKI   CKD IIIa Patient with GFR of 49 on admission, with Cr 1.31 and BUN 13. Baseline Cr is (1.06 - 1.10).  -AM BMP -Continue to monitor  DM2 Patient taking Tresiba 156 units in the morning. Awaiting med reconcilliation with pharmacy. Last Hgb A1c was 7.4 08/21/21 -monitor CBGs -SSI - consider adding long acting insulin tomorrow once she starts taking po   GERD Patient with history of GERD per chart review. Patient taking Protonix per char review -Continue IV Protonix  HLD Patient taking Lipitor 20 mg. Last lipid profile was 2019, Tot Chol 165, HDL  51, LDL 89, Tri 124 -Lipid profile -Continue Lipitor   COPD Patient taking Dulera 200-5 mg daily, and albuterol prn - Continue home medications  HFpEF Patient last echo was 06/04/2017 with EF  60-65% and grade 1 diastolic dysfunction.   Depression Patient with depression per chart review, taking Zoloft 100 mg daily, and Wellbutrin 150 mg BID -Continue home medication   OSA Patient with history of OSA per chart review, not on CPAP   FEN/GI: NPO Prophylaxis: SCDs  Disposition: Admit to FPTS  History of Present Illness:  [name] is a 51 y.o. female presenting with abdominal pain, nausea, vomiting   Endorses upper abdominal pain, nausea and vomiting since Wed, 5 days ago. States the abdominal pain is a sharp and twisting sensation. Noticed coffee ground emesis since Thurs or Friday. Endorses sharp chest pain when she coughs and throws up. Denies radiation of pain. States she has felt this abdominal pain in the past and has seen a provider for it and deemed it was due to gastroparesis. Currently has not been eating or drinking. States last ate on Tuesday. Last bowel movement was this morning, and normal.   Endorses some CP, SOB. Denies fever, rash. Denies diarrhea, constipation. Does have a headache currently in the front right area of her head for the past 4 hours  Last drank alcohol on 12/30. Denies recreational drug use, tobacco use.   Review Of Systems: Per HPI with the following additions:    Review of Systems   Patient Active Problem List   Diagnosis Date Noted   Fusion of spine of lumbar region 08/20/2021   Spondylolisthesis, lumbar region 08/20/2021    Class: Chronic   Chronic respiratory failure with hypoxia ([place]) 06/20/2021   Preop pulmonary/respiratory exam 06/20/2021   Abdominal pain, epigastric 11/28/2020   Nausea and vomiting 11/28/2020   COVID-19 09/16/2020   Dyspnea 08/26/2020   Rotator cuff tear 07/28/2019   Insulin dependent type 2 diabetes mellitus ([place]) 04/12/2019   Chest pain of uncertain etiology 03/70/4888   Lactic acidosis 03/22/2019   Chronic right shoulder pain    Community acquired pneumonia 10/25/2018   Asthma,  chronic, unspecified asthma severity, with acute exacerbation 10/25/2018   Acute respiratory failure with hypoxia and hypercapnia ([place]) 10/23/2018   Acute pain of right shoulder due to trauma 10/05/2018   Chronic bilateral low back pain without sciatica 10/05/2018   Dysphonia 10/05/2018   Severe asthma with exacerbation 10/05/2018   Rotator cuff arthropathy, right 06/15/2018   AKI (acute kidney injury) ([place]) 03/06/2018   Carpal tunnel syndrome 01/18/2018   Heart failure ([place]) 05/20/2017   Normocytic anemia 05/06/2017   Tracheostomy dependent ([place]) 03/26/2017   Dysphagia 03/26/2017   Status post tracheostomy ([place]) 03/14/2017   Klebsiella cystitis 03/14/2017   Morbid obesity ([place]) 03/13/2017   Subglottic stenosis 03/06/2017   Asthma 03/06/2017   Chronic diastolic heart failure ([place]) 03/06/2017   Tracheal stenosis 03/04/2017   Acute blood loss anemia    Generalized anxiety disorder    Chronic pain syndrome    GERD (gastroesophageal reflux disease) 02/22/2016   Depression 02/22/2016   Migraine 01/30/2015   Bulging lumbar disc 09/05/2013   Essential hypertension 09/05/2013   Allergic rhinitis, seasonal 08/11/2012   Chronic cough 08/11/2012   OSA (obstructive sleep apnea) 12/04/2011    Past Medical History: Past Medical History:  Diagnosis Date   Allergy    Anxiety    Arthritis    Asthma    Chronic back pain  Chronic chest pain    resolved, no problems since 2019 per patient 07/27/19   COPD (chronic obstructive pulmonary disease) ([place])    Depression    DM (diabetes mellitus) ([place])    INSULIN DEPENDENT - TYPE 1   Dyspnea    GERD (gastroesophageal reflux disease)    Headache(784.0)    History of hiatal hernia    Hyperlipidemia    no med, diet controlled   Hypertension    Hypokalemia    Pneumonia    Respiratory disease 05/2017   uses inhalers, neb tx prn, no oxygen   Sleep apnea    does not use CPAP due to trach   Tracheostomy in place [facility]) 02/2017    Past  Surgical History: Past Surgical History:  Procedure Laterality Date   ABDOMINAL HYSTERECTOMY  2009   APPENDECTOMY     CESAREAN SECTION     x 3   CHOLECYSTECTOMY N/A 03/02/2014   Procedure: LAPAROSCOPIC CHOLECYSTECTOMY;  Surgeon: Joyice Faster. Cornett, MD;  Location: [place];  Service: General;  Laterality: N/A;   HERNIA REPAIR     PANENDOSCOPY N/A 03/04/2017   Procedure: PANENDOSCOPY WITH POSSIBLE FOREIGN BODY REMOVAL;  Surgeon: Jodi Marble, MD;  Location: [place];  Service: ENT;  Laterality: N/A;   ROTATOR CUFF REPAIR Left    ROTATOR CUFF REPAIR Right 07/27/2019   SHOULDER ARTHROSCOPY WITH SUBACROMIAL DECOMPRESSION, ROTATOR CUFF REPAIR AND BICEP TENDON REPAIR Right 07/28/2019   Procedure: RIGHT SHOULDER ARTHROSCOP, MINI OPEN ROTATOR CUFF TEAR REPAIR,  BICEPS TENODESIS, DISTAL CLAVICLE EXCISION;  Surgeon: Meredith Pel, MD;  Location: [place];  Service: Orthopedics;  Laterality: Right;   TOOTH EXTRACTION N/A 02/22/2021   Procedure: DENTAL RESTORATION/EXTRACTIONS;  Surgeon: Diona Browner, DMD;  Location: [facility];  Service: Oral Surgery;  Laterality: N/A;   TRACHEOSTOMY  02/2017   TUBAL LIGATION     VESICOVAGINAL FISTULA CLOSURE W/ TAH  2009    Social History: Social History   Tobacco Use   Smoking status: Former    Packs/day: 0.50    Years: 25.00    Pack years: 12.50    Types: Cigarettes    Quit date: 01/21/2016    Years since quitting: 5.7   Smokeless tobacco: Never  Vaping Use   Vaping Use: Never used  Substance Use Topics   Alcohol use: Not Currently    Comment: social wine   Drug use: Not Currently    Types: Marijuana    Comment: couple times a month   Additional social history:   Please also refer to relevant sections of EMR.  Family History: Family History  Problem Relation Age of Onset   Heart attack Mother    Stroke Mother    Diabetes Mother    Hypertension Mother    Arthritis Mother    Stroke Father    Asthma Sister    Hypertension Sister    Asthma Sister     Diabetes Sister    Asthma Brother    Seizures Brother    Asthma Brother    Diabetes Brother    Asthma Daughter    Asthma Son    Asthma Grandson    Colon cancer Neg Hx    Rectal cancer Neg Hx    Stomach cancer Neg Hx      Allergies and Medications: Allergies  Allergen Reactions   Reglan [Metoclopramide] Other (See Comments)    Panic attack   No current facility-administered medications on file prior to encounter.   Current Outpatient Medications on  File Prior to Encounter  Medication Sig Dispense Refill   albuterol (PROAIR HFA) 108 (90 Base) MCG/ACT inhaler Inhale 2 puffs into the lungs every 6 (six) hours as needed for wheezing or shortness of breath. 18 g 6   albuterol (PROVENTIL) (2.5 MG/3ML) 0.083% nebulizer solution Take 3 mLs (2.5 mg total) by nebulization every 6 (six) hours as needed for wheezing or shortness of breath. 75 mL 3   aspirin EC 81 MG EC tablet Take 1 tablet (81 mg total) by mouth daily. Swallow whole. 30 tablet 11   atorvastatin (LIPITOR) 20 MG tablet Take 1 tablet (20 mg total) by mouth daily. 90 tablet 1   Blood Glucose Monitoring Suppl (ONETOUCH VERIO) w/Device KIT 1 each by Does not apply route daily. Use to monitor glucose levels daily; E11.9 1 kit 0   buPROPion (WELLBUTRIN SR) 150 MG 12 hr tablet Take 1 tablet (150 mg total) by mouth 2 (two) times daily. 180 tablet 1   cetirizine (ZYRTEC) 10 MG tablet Take 1 tablet (10 mg total) by mouth at bedtime. 30 tablet 5   dapagliflozin propanediol (FARXIGA) 5 MG TABS tablet Take 1 tablet (5 mg total) by mouth daily before breakfast. 30 tablet 3   diclofenac Sodium (VOLTAREN) 1 % GEL Apply 4 g topically 4 (four) times daily. 100 g 1   dicyclomine (BENTYL) 20 MG tablet Take 1 tablet (20 mg total) by mouth 2 (two) times daily. 20 tablet 0   docusate sodium (COLACE) 100 MG capsule Take 1 capsule (100 mg total) by mouth 2 (two) times daily. 40 capsule 0   furosemide (LASIX) 20 MG tablet Take 1 tablet (20 mg total) by  mouth daily. (Patient taking differently: Take 20 mg by mouth daily as needed for fluid or edema.) 30 tablet 2   gabapentin (NEURONTIN) 300 MG capsule Take 2 capsules (600 mg total) by mouth 2 (two) times daily. 360 capsule 1   glucose blood (ONETOUCH VERIO) test strip Use to monitor glucose levels daily; E11.9 100 each 2   hydrOXYzine (ATARAX/VISTARIL) 25 MG tablet Take 1 tablet (25 mg total) by mouth 3 (three) times daily as needed for anxiety. 180 tablet 1   insulin degludec (TRESIBA FLEXTOUCH) 200 UNIT/ML FlexTouch Pen Inject 156 Units into the skin daily. (Patient taking differently: Inject 155 Units into the skin at bedtime.) 84 mL 3   Insulin Pen Needle 31G X 8 MM MISC Inject 1 Units as directed daily.     lisinopril (ZESTRIL) 20 MG tablet Take 1 tablet (20 mg total) by mouth daily. 90 tablet 1   Melatonin 3 MG TABS Take 3 mg by mouth at bedtime.     methocarbamol (ROBAXIN) 750 MG tablet Take 1 tablet (750 mg total) by mouth every 8 (eight) hours as needed for muscle spasms. 60 tablet 5   Misc. Devices MISC Nebulizer device. Diagnosis - Asthma 1 each 0   Misc. Devices [employer].  Diagnosis-low back pain 1 each 0   Misc. Devices [place] Shower chair 1 each 0   Misc. Devices MISC Bedside commode. Diagnosis- low back pain 1 each 0   mometasone-formoterol (DULERA) 200-5 MCG/ACT AERO Inhale 2 puffs into the lungs in the morning and at bedtime. 1 each 6   ondansetron (ZOFRAN) 4 MG tablet TAKE 1 TABLET BY MOUTH EVERY 8 HOURS AS NEEDED FOR NAUSEA OR VOMITING 30 tablet 0   ondansetron (ZOFRAN-ODT) 4 MG disintegrating tablet 67m ODT q4 hours prn nausea/vomit 10 tablet 0   OneTouch  Delica Lancets 50P MISC 1 each by Does not apply route daily. Use to monitor glucose levels daily; E11.9 100 each 2   pantoprazole (PROTONIX) 20 MG tablet Take 1 tablet (20 mg total) by mouth daily. 30 tablet 0   pantoprazole (PROTONIX) 40 MG tablet Take 1 tablet (40 mg total) by mouth 2 (two) times daily. (Patient taking  differently: Take 40 mg by mouth daily as needed (heartburn).) 60 tablet 3   polyethylene glycol (MIRALAX / GLYCOLAX) 17 g packet Take 17 g by mouth daily as needed for mild constipation. 14 each 0   Semaglutide,0.25 or 0.5MG/DOS, (OZEMPIC, 0.25 OR 0.5 MG/DOSE,) 2 MG/1.5ML SOPN Inject 0.5 mg into the skin once a week. 4.5 mL 3   sertraline (ZOLOFT) 100 MG tablet Take 1 tablet (100 mg total) by mouth at bedtime. 90 tablet 1   silver sulfADIAZINE (SILVADENE) 1 % cream Apply 1 application topically daily. 50 g 1   sucralfate (CARAFATE) 1 g tablet Take 1 tablet (1 g total) by mouth 4 (four) times daily -  with meals and at bedtime. 120 tablet 1   SUMAtriptan (IMITREX) 100 MG tablet At the onset of a migraine; may repeat in 2 hours. Max daily dose 272m 20 tablet 6   topiramate (TOPAMAX) 100 MG tablet Take 1.5 tablets (150 mg total) by mouth 2 (two) times daily. 90 tablet 1   [DISCONTINUED] fluticasone-salmeterol (ADVAIR HFA) 230-21 MCG/ACT inhaler Inhale 2 puffs into the lungs 2 (two) times daily. 1 Inhaler 3    Objective: BP (!) 172/114 (BP Location: Left Arm)    Pulse (!) 109    Temp 98.9 F (37.2 C) (Oral)    Resp 20    LMP  (LMP Unknown)    SpO2 99%  Exam: General: Ill appearing, poorly understood limited to trach collar, in pain/discomort Eyes: PERRL, EOMI ENTM: MMM Neck: Soft Cardiovascular: Distant heart sounds Respiratory: CTABL Gastrointestinal: Soft, Non-distended, diffusely TTP MSK: Moving all extremities independently, no edema Neuro: Strength 5/5 grossly in all extremities, sensation intact grossly, no dysarthria or facial droop.  Psych: Sad affect  Labs and Imaging: CBC BMET  Recent Labs  Lab 10/14/21 1050  WBC 11.0*  HGB 12.3  HCT 38.0  PLT 384   Recent Labs  Lab 10/14/21 1050  NA 138  K 4.4  CL 102  CO2 22  BUN 13  CREATININE 1.31*  GLUCOSE 107*  CALCIUM 9.3     EKG: My own interpretation (not copied from electronic read) sinus rhythm with borderline  prolonged Qtc.    CT ABDOMEN PELVIS W CONTRAST  Result Date: 10/11/2021 CLINICAL DATA:  Abdominal pain. Nausea and vomiting with elevated WBCs. EXAM: CT ABDOMEN AND PELVIS WITH CONTRAST TECHNIQUE: Multidetector CT imaging of the abdomen and pelvis was performed using the standard protocol following bolus administration of intravenous contrast. RADIATION DOSE REDUCTION: This exam was performed according to the departmental dose-optimization program which includes automated exposure control, adjustment of the mA and/or kV according to patient size and/or use of iterative reconstruction technique. CONTRAST:  1057mOMNIPAQUE IOHEXOL 300 MG/ML  SOLN COMPARISON:  04/01/2021 FINDINGS: Lower chest: No acute abnormality. Hepatobiliary: Diffuse low attenuation throughout the liver compatible with hepatic steatosis. No focal liver abnormality. Cholecystectomy. CBD measures 7 mm. No intrahepatic bile duct dilatation Pancreas: Unremarkable. No pancreatic ductal dilatation or surrounding inflammatory changes. Spleen: Normal in size without focal abnormality. Adrenals/Urinary Tract: Normal adrenal glands. The kidneys appear normal. No nephrolithiasis, hydronephrosis or mass. Diverticulosis noted arising off the  anterior dome of bladder measuring 1.7 cm. Stomach/Bowel: Stomach is nondistended. Status post appendectomy. No bowel wall thickening, inflammation, or distension. Vascular/Lymphatic: No enlarged abdominopelvic lymph nodes. No significant vascular abnormality. Reproductive: Status post hysterectomy. Prominent left ovary is again noted measuring 3.9 x 2.3 by 2.8 cm (volume = 13 cm^3). Normal appearance of the right ovary. Other: No ascites or focal fluid collections. Musculoskeletal: No acute or significant osseous findings. Status post PLIF at L4-5. IMPRESSION: 1. No acute findings within the abdomen or pelvis. 2. Hepatic steatosis. 3. Unchanged mild increased size of left ovary. No discrete left adnexal mass noted.  Electronically Signed   By: Kerby Moors M.D.   On: 10/11/2021 08:05   DG Chest Portable 1 View  Result Date: 10/14/2021 CLINICAL DATA:  Epigastric pain EXAM: PORTABLE CHEST 1 VIEW COMPARISON:  08/22/2021 FINDINGS: Stable tracheostomy tube. Stable heart size. Low lung volumes. No focal airspace consolidation, pleural effusion, or pneumothorax. IMPRESSION: No active disease. Electronically Signed   By: Davina Poke D.O.   On: 10/14/2021 15:39   DG Abdomen Acute W/Chest  Result Date: 10/13/2021 CLINICAL DATA:  Abdominal pain. EXAM: DG ABDOMEN ACUTE WITH 1 VIEW CHEST COMPARISON:  CTA chest abdomen pelvis 10/12/2021 FINDINGS: Tracheostomy tube noted. Low lung volumes without edema or focal consolidation. No pleural effusion. Cardiopericardial silhouette is at upper limits of normal for size. The visualized bony structures of the thorax show no acute abnormality. Upright abdominal film does not include the hemidiaphragms no evidence for free air on the chest x-ray. There is no evidence for gaseous bowel dilation to suggest obstruction. Fixation hardware noted lower lumbar spine. Surgical clips in the right upper quadrant suggest prior cholecystectomy IMPRESSION: 1. No acute cardiopulmonary findings. 2. No evidence for bowel perforation or obstruction. Electronically Signed   By: Misty Stanley M.D.   On: 10/13/2021 15:34   CT Angio Chest/Abd/Pel for Dissection W and/or Wo Contrast  Result Date: 10/12/2021 CLINICAL DATA:  Chest and back pain.  Concern for aortic syndrome. EXAM: CT ANGIOGRAPHY CHEST, ABDOMEN AND PELVIS TECHNIQUE: Non-contrast CT of the chest was initially obtained. Multidetector CT imaging through the chest, abdomen and pelvis was performed using the standard protocol during bolus administration of intravenous contrast. Multiplanar reconstructed images and MIPs were obtained and reviewed to evaluate the vascular anatomy. RADIATION DOSE REDUCTION: This exam was performed according to the  departmental dose-optimization program which includes automated exposure control, adjustment of the mA and/or kV according to patient size and/or use of iterative reconstruction technique. CONTRAST:  175m OMNIPAQUE IOHEXOL 350 MG/ML SOLN COMPARISON:  CT abdomen pelvis dated 10/11/2021 FINDINGS: CTA CHEST FINDINGS Cardiovascular: There is no cardiomegaly or pericardial effusion. The thoracic aorta is unremarkable. The origins of the great vessels of the aortic arch appear patent. The central pulmonary arteries are unremarkable. Mediastinum/Nodes: No hilar or mediastinal adenopathy. The esophagus is grossly unremarkable. No mediastinal fluid collection. Lungs/Pleura: The lungs are clear. There is no pleural effusion pneumothorax. The central airways are patent. Tracheostomy with tip approximately 5.5 cm above the carina. Musculoskeletal: No acute osseous pathology. Review of the MIP images confirms the above findings. CTA ABDOMEN AND PELVIS FINDINGS VASCULAR Aorta: Normal caliber aorta without aneurysm, dissection, vasculitis or significant stenosis. Celiac: Patent without evidence of aneurysm, dissection, vasculitis or significant stenosis. SMA: Patent without evidence of aneurysm, dissection, vasculitis or significant stenosis. Renals: Both renal arteries are patent without evidence of aneurysm, dissection, vasculitis, fibromuscular dysplasia or significant stenosis. IMA: Patent without evidence of aneurysm, dissection, vasculitis or significant  stenosis. Inflow: Patent without evidence of aneurysm, dissection, vasculitis or significant stenosis. Veins: No obvious venous abnormality within the limitations of this arterial phase study. Review of the MIP images confirms the above findings. NON-VASCULAR No intra-abdominal free air or free fluid. Hepatobiliary: Fatty liver. No intrahepatic biliary ductal dilatation. Cholecystectomy. Pancreas: Unremarkable. No pancreatic ductal dilatation or surrounding inflammatory  changes. Spleen: Normal in size without focal abnormality. Adrenals/Urinary Tract: Left adrenal thickening or hyperplasia. The right adrenal gland is unremarkable. There is no hydronephrosis on either side. The visualized ureters and urinary bladder appear unremarkable. Stomach/Bowel: No bowel obstruction or active inflammation. The appendix is not visualized with certainty. No inflammatory changes identified in the right lower quadrant. Lymphatic: No adenopathy. Reproductive: Hysterectomy. Asymmetric enlargement of the left ovary. Similar appearance of a 2 cm fluid density ovoid lesion in the anterior pelvis to the right of the midline. Other: Midline vertical anterior abdominal wall incisional scar. Musculoskeletal: Lower lumbar posterior fusion and disc spacer. No acute osseous pathology. Review of the MIP images confirms the above findings. IMPRESSION: 1. No acute intrathoracic, abdominal, or pelvic pathology. No aortic dissection or aneurysm. 2. Fatty liver. Electronically Signed   By: Anner Crete M.D.   On: 10/12/2021 21:07   XR Lumbar Spine 2-3 Views  Result Date: 10/03/2021 AP and  lateral radiographs show the instrumentation pedicle screws and rods and cage in good position and alignment. No abnormality.   US Abdomen Limited RUQ (LIVER/GB)  Result Date: 10/14/2021 CLINICAL DATA:  Abdominal pain, nausea, vomiting EXAM: ULTRASOUND ABDOMEN LIMITED RIGHT UPPER QUADRANT COMPARISON:  Sonogram done on 02/28/2014, CT abdomen done on 10/11/2021 FINDINGS: Gallbladder: Gallbladder is not seen consistent with cholecystectomy. Common bile duct: Diameter: 7 mm Liver: There is increased echogenicity in the liver suggesting fatty infiltration. There is 1.7 cm cystic structure in the porta hepatis, possibly a hepatic cyst. This finding was not distinctly seen in the previous CT. Portal vein is patent on color Doppler imaging with normal direction of blood flow towards the liver. Other: None. IMPRESSION: Status  post cholecystectomy.  Fatty liver. Electronically Signed   By: Elmer Picker M.D.   On: 10/14/2021 13:01     Holley Bouche, MD 10/14/2021, 4:44 PM PGY-1, [place] [facility] pager: [phone], text pages welcome  FPTS Upper-Level Resident Addendum   I have independently interviewed and examined the patient. I have discussed the above with the original author and agree with their documentation. My edits for correction/addition/clarification are included. Please see also any attending notes.   Sonia Side, D.O. PGY-2, [facility] 10/14/2021 8:04 PM  [place] pager: [phone] (text pages welcome through [facility])

## 2021-10-14 NOTE — ED Provider Triage Note (Addendum)
Emergency Medicine Provider Triage Evaluation Note  Gesenia Bantz [name] , a 51 y.o. female  was evaluated in triage.  Pt complains of abdominal pain. She was seen multiple times for same.  After discharge she states she was ok for a few hours but then started having pain again.   Review of Systems  Positive: See above Negative:   Physical Exam  BP (!) 214/123 (BP Location: Right Arm)    Pulse (!) 103    Temp 99.4 F (37.4 C) (Oral)    Resp (!) 21    LMP  (LMP Unknown)    SpO2 100%  Gen:   Awake, appears uncomfortable but less so than yesterday  Resp:  Normal effort, trach present MSK:   Moves extremities without difficulty  Other:  Spitting into vomit bag.   Medical Decision Making  Medically screening exam initiated at 11:28 AM.  Appropriate orders placed.  Alysse Mae [name] was informed that the remainder of the evaluation will be completed by another provider, this initial triage assessment does not replace that evaluation, and the importance of remaining in the ED until their evaluation is complete.  Note: Portions of this report may have been transcribed using voice recognition software. Every effort was made to ensure accuracy; however, inadvertent computerized transcription errors may be present    Ollen Gross 10/14/21 1130  Yesterday she required multiple medications, it appears she tolerated 1mg  of dialudid.  As she will be in the waiting room will start with 0.5mg  of dilaudid as ems gave her 173mcg of fentanyl and zofran PTA     Lorin Glass, PA-C 10/14/21 1133

## 2021-10-14 NOTE — Telephone Encounter (Signed)
Pt is currently in the ER at the hospital.

## 2021-10-14 NOTE — Telephone Encounter (Signed)
My chart message:  "Now been back and forth to hospital for my stomach nothing is working "

## 2021-10-14 NOTE — Telephone Encounter (Signed)
Patient's daughter calling from the ED with Mr. [name]. She is requesting Dr. Margarita Rana to call the ED to speak with staff regarding the patient. Explained this was not policy and the physician does not call the ER. Chief Complaint:  Symptoms:  Frequency:  Pertinent Negatives: Patient denies  Disposition: [x] ED /[] Urgent Care (no appt availability in office) / [] Appointment(In office/virtual)/ []  [name] [facility]/ [] Home Care/ [] Refused Recommended Disposition /[] [facility]/ []  Follow-up with PCP Additional Notes: Explained the patient needs to stay in the ED and to check back with the desk often.
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
## 2021-10-14 NOTE — ED Triage Notes (Signed)
Trach pt with hx of gastroparesis here for eval of abdominal pain with n/v x 1 week. Seen here yesterday for same. Taking rx without relief. 4mg  zofran and 100 mcg fentanyl given by EMS without improvement.

## 2021-10-15 ENCOUNTER — Ambulatory Visit: Payer: 59 | Admitting: Internal Medicine

## 2021-10-15 ENCOUNTER — Observation Stay (HOSPITAL_COMMUNITY): Payer: 59 | Admitting: Anesthesiology

## 2021-10-15 ENCOUNTER — Inpatient Hospital Stay (HOSPITAL_COMMUNITY): Payer: 59

## 2021-10-15 ENCOUNTER — Encounter (HOSPITAL_COMMUNITY)
Admission: EM | Disposition: A | Discharge status: Home / Self Care | Payer: Self-pay | Source: Home / Self Care | Attending: Family Medicine

## 2021-10-15 ENCOUNTER — Encounter (HOSPITAL_COMMUNITY): Payer: Self-pay | Admitting: Family Medicine

## 2021-10-15 DIAGNOSIS — R1013 Epigastric pain: Secondary | ICD-10-CM | POA: Diagnosis not present

## 2021-10-15 DIAGNOSIS — K760 Fatty (change of) liver, not elsewhere classified: Secondary | ICD-10-CM | POA: Diagnosis present

## 2021-10-15 DIAGNOSIS — Z794 Long term (current) use of insulin: Secondary | ICD-10-CM

## 2021-10-15 DIAGNOSIS — Z6841 Body Mass Index (BMI) 40.0 and over, adult: Secondary | ICD-10-CM | POA: Diagnosis not present

## 2021-10-15 DIAGNOSIS — K2100 Gastro-esophageal reflux disease with esophagitis, without bleeding: Secondary | ICD-10-CM | POA: Diagnosis present

## 2021-10-15 DIAGNOSIS — K2970 Gastritis, unspecified, without bleeding: Secondary | ICD-10-CM | POA: Diagnosis present

## 2021-10-15 DIAGNOSIS — K920 Hematemesis: Secondary | ICD-10-CM | POA: Diagnosis present

## 2021-10-15 DIAGNOSIS — E785 Hyperlipidemia, unspecified: Secondary | ICD-10-CM | POA: Diagnosis present

## 2021-10-15 DIAGNOSIS — R1110 Vomiting, unspecified: Secondary | ICD-10-CM

## 2021-10-15 DIAGNOSIS — J449 Chronic obstructive pulmonary disease, unspecified: Secondary | ICD-10-CM | POA: Diagnosis present

## 2021-10-15 DIAGNOSIS — K3189 Other diseases of stomach and duodenum: Secondary | ICD-10-CM | POA: Diagnosis not present

## 2021-10-15 DIAGNOSIS — Z20822 Contact with and (suspected) exposure to covid-19: Secondary | ICD-10-CM | POA: Diagnosis present

## 2021-10-15 DIAGNOSIS — F32A Depression, unspecified: Secondary | ICD-10-CM | POA: Diagnosis present

## 2021-10-15 DIAGNOSIS — I5032 Chronic diastolic (congestive) heart failure: Secondary | ICD-10-CM | POA: Diagnosis present

## 2021-10-15 DIAGNOSIS — I130 Hypertensive heart and chronic kidney disease with heart failure and stage 1 through stage 4 chronic kidney disease, or unspecified chronic kidney disease: Secondary | ICD-10-CM | POA: Diagnosis present

## 2021-10-15 DIAGNOSIS — E871 Hypo-osmolality and hyponatremia: Secondary | ICD-10-CM | POA: Diagnosis not present

## 2021-10-15 DIAGNOSIS — F411 Generalized anxiety disorder: Secondary | ICD-10-CM | POA: Diagnosis present

## 2021-10-15 DIAGNOSIS — K7689 Other specified diseases of liver: Secondary | ICD-10-CM | POA: Diagnosis present

## 2021-10-15 DIAGNOSIS — K31A0 Gastric intestinal metaplasia, unspecified: Secondary | ICD-10-CM | POA: Diagnosis present

## 2021-10-15 DIAGNOSIS — R1010 Upper abdominal pain, unspecified: Secondary | ICD-10-CM | POA: Diagnosis not present

## 2021-10-15 DIAGNOSIS — J386 Stenosis of larynx: Secondary | ICD-10-CM | POA: Diagnosis present

## 2021-10-15 DIAGNOSIS — R112 Nausea with vomiting, unspecified: Secondary | ICD-10-CM

## 2021-10-15 DIAGNOSIS — N1831 Chronic kidney disease, stage 3a: Secondary | ICD-10-CM | POA: Diagnosis present

## 2021-10-15 DIAGNOSIS — E1122 Type 2 diabetes mellitus with diabetic chronic kidney disease: Secondary | ICD-10-CM | POA: Diagnosis present

## 2021-10-15 DIAGNOSIS — J398 Other specified diseases of upper respiratory tract: Secondary | ICD-10-CM | POA: Diagnosis present

## 2021-10-15 DIAGNOSIS — Z930 Tracheostomy status: Secondary | ICD-10-CM | POA: Diagnosis not present

## 2021-10-15 DIAGNOSIS — R933 Abnormal findings on diagnostic imaging of other parts of digestive tract: Secondary | ICD-10-CM | POA: Diagnosis not present

## 2021-10-15 DIAGNOSIS — K2990 Gastroduodenitis, unspecified, without bleeding: Secondary | ICD-10-CM | POA: Diagnosis not present

## 2021-10-15 DIAGNOSIS — G894 Chronic pain syndrome: Secondary | ICD-10-CM

## 2021-10-15 DIAGNOSIS — E119 Type 2 diabetes mellitus without complications: Secondary | ICD-10-CM | POA: Diagnosis not present

## 2021-10-15 DIAGNOSIS — E876 Hypokalemia: Secondary | ICD-10-CM | POA: Diagnosis not present

## 2021-10-15 DIAGNOSIS — I161 Hypertensive emergency: Secondary | ICD-10-CM | POA: Diagnosis present

## 2021-10-15 DIAGNOSIS — R809 Proteinuria, unspecified: Secondary | ICD-10-CM | POA: Diagnosis present

## 2021-10-15 DIAGNOSIS — Z8616 Personal history of COVID-19: Secondary | ICD-10-CM | POA: Diagnosis not present

## 2021-10-15 HISTORY — PX: BIOPSY: SHX5522

## 2021-10-15 HISTORY — PX: ESOPHAGOGASTRODUODENOSCOPY (EGD) WITH PROPOFOL: SHX5813

## 2021-10-15 LAB — CBC
HCT: 34.5 % — ABNORMAL LOW (ref 36.0–46.0)
Hemoglobin: 11.2 g/dL — ABNORMAL LOW (ref 12.0–15.0)
MCH: 26.9 pg (ref 26.0–34.0)
MCHC: 32.5 g/dL (ref 30.0–36.0)
MCV: 82.7 fL (ref 80.0–100.0)
Platelets: 383 10*3/uL (ref 150–400)
RBC: 4.17 MIL/uL (ref 3.87–5.11)
RDW: 14.5 % (ref 11.5–15.5)
WBC: 9.9 10*3/uL (ref 4.0–10.5)
nRBC: 0 % (ref 0.0–0.2)

## 2021-10-15 LAB — RESP PANEL BY RT-PCR (FLU A&B, COVID) ARPGX2
Influenza A by PCR: NEGATIVE
Influenza B by PCR: NEGATIVE
SARS Coronavirus 2 by RT PCR: NEGATIVE

## 2021-10-15 LAB — CBG MONITORING, ED
Glucose-Capillary: 72 mg/dL (ref 70–99)
Glucose-Capillary: 86 mg/dL (ref 70–99)
Glucose-Capillary: 67 mg/dL — ABNORMAL LOW (ref 70–99)

## 2021-10-15 LAB — BASIC METABOLIC PANEL
Anion gap: 9 (ref 5–15)
Anion gap: 13 (ref 5–15)
BUN: 8 mg/dL (ref 6–20)
BUN: 9 mg/dL (ref 6–20)
CO2: 22 mmol/L (ref 22–32)
CO2: 18 mmol/L — ABNORMAL LOW (ref 22–32)
Calcium: 8.7 mg/dL — ABNORMAL LOW (ref 8.9–10.3)
Calcium: 8.7 mg/dL — ABNORMAL LOW (ref 8.9–10.3)
Chloride: 105 mmol/L (ref 98–111)
Chloride: 102 mmol/L (ref 98–111)
Creatinine, Ser: 1.03 mg/dL — ABNORMAL HIGH (ref 0.44–1.00)
Creatinine, Ser: 1.11 mg/dL — ABNORMAL HIGH (ref 0.44–1.00)
GFR, Estimated: >60 mL/min (ref >60)
GFR, Estimated: 60 mL/min — ABNORMAL LOW (ref >60)
Glucose, Bld: 94 mg/dL (ref 70–99)
Glucose, Bld: 82 mg/dL (ref 70–99)
Potassium: 2.9 mmol/L — ABNORMAL LOW (ref 3.5–5.1)
Potassium: 4.8 mmol/L (ref 3.5–5.1)
Sodium: 136 mmol/L (ref 135–145)
Sodium: 133 mmol/L — ABNORMAL LOW (ref 135–145)

## 2021-10-15 LAB — GLUCOSE, CAPILLARY
Glucose-Capillary: 128 mg/dL — ABNORMAL HIGH (ref 70–99)
Glucose-Capillary: 83 mg/dL (ref 70–99)
Glucose-Capillary: 85 mg/dL (ref 70–99)
Glucose-Capillary: 90 mg/dL (ref 70–99)
Glucose-Capillary: 94 mg/dL (ref 70–99)

## 2021-10-15 LAB — LACTIC ACID, PLASMA
Lactic Acid, Venous: 0.9 mmol/L (ref 0.5–1.9)
Lactic Acid, Venous: 1.1 mmol/L (ref 0.5–1.9)

## 2021-10-15 LAB — HIV ANTIBODY (ROUTINE TESTING W REFLEX): HIV Screen 4th Generation wRfx: NONREACTIVE

## 2021-10-15 SURGERY — ESOPHAGOGASTRODUODENOSCOPY (EGD) WITH PROPOFOL
Anesthesia: Monitor Anesthesia Care

## 2021-10-15 MED ORDER — PROPOFOL 10 MG/ML IV BOLUS
INTRAVENOUS | Status: DC | PRN
  Administered 2021-10-15: 50 mg via INTRAVENOUS

## 2021-10-15 MED ORDER — HYDROMORPHONE HCL 1 MG/ML IJ SOLN
0.5000 mg | Freq: Four times a day (QID) | INTRAMUSCULAR | Status: DC
  Administered 2021-10-15 (×3): 0.5 mg via INTRAVENOUS
  Filled 2021-10-15: qty 1
  Filled 2021-10-15 (×2): qty 0.5

## 2021-10-15 MED ORDER — POTASSIUM CHLORIDE 10 MEQ/100ML IV SOLN
10.0000 meq | INTRAVENOUS | Status: AC
  Administered 2021-10-15 (×4): 10 meq via INTRAVENOUS
  Filled 2021-10-15 (×4): qty 100

## 2021-10-15 MED ORDER — HYDRALAZINE HCL 20 MG/ML IJ SOLN
5.0000 mg | Freq: Once | INTRAMUSCULAR | Status: AC
Start: 2021-10-15 — End: 2021-10-15
  Administered 2021-10-15: 09:00:00 5 mg via INTRAVENOUS

## 2021-10-15 MED ORDER — ACETAMINOPHEN 10 MG/ML IV SOLN
1000.0000 mg | Freq: Four times a day (QID) | INTRAVENOUS | Status: AC
  Administered 2021-10-15 – 2021-10-16 (×4): 1000 mg via INTRAVENOUS
  Filled 2021-10-15 (×4): qty 100

## 2021-10-15 MED ORDER — HYDRALAZINE HCL 20 MG/ML IJ SOLN
INTRAMUSCULAR | Status: AC
  Filled 2021-10-15: qty 1

## 2021-10-15 MED ORDER — PANTOPRAZOLE SODIUM 40 MG PO TBEC
40.0000 mg | DELAYED_RELEASE_TABLET | Freq: Two times a day (BID) | ORAL | Status: DC
  Administered 2021-10-15 – 2021-10-18 (×6): 40 mg via ORAL
  Filled 2021-10-15 (×7): qty 1

## 2021-10-15 MED ORDER — DEXTROSE 50 % IV SOLN
50.0000 mL | Freq: Once | INTRAVENOUS | Status: AC
  Administered 2021-10-15: 08:00:00 50 mL via INTRAVENOUS
  Filled 2021-10-15: qty 50

## 2021-10-15 MED ORDER — POTASSIUM CHLORIDE 20 MEQ PO PACK
40.0000 meq | PACK | Freq: Once | ORAL | Status: AC
  Administered 2021-10-15: 18:00:00 40 meq via ORAL
  Filled 2021-10-15: qty 2

## 2021-10-15 MED ORDER — HYDROMORPHONE HCL 1 MG/ML IJ SOLN
1.0000 mg | Freq: Once | INTRAMUSCULAR | Status: AC
  Administered 2021-10-15: 05:00:00 1 mg via INTRAVENOUS
  Filled 2021-10-15: qty 1

## 2021-10-15 MED ORDER — HYDROMORPHONE HCL 1 MG/ML IJ SOLN: 0.5000 mg | Freq: Once | INTRAMUSCULAR | Status: DC

## 2021-10-15 MED ORDER — ONDANSETRON HCL 4 MG/2ML IJ SOLN
INTRAMUSCULAR | Status: DC | PRN
Start: 2021-10-15 — End: 2021-10-15
  Administered 2021-10-15: 4 mg via INTRAVENOUS

## 2021-10-15 MED ORDER — LIDOCAINE VISCOUS HCL 2 % MT SOLN
15.0000 mL | Freq: Once | OROMUCOSAL | Status: AC
  Administered 2021-10-15: 16:00:00 15 mL via ORAL
  Filled 2021-10-15: qty 15

## 2021-10-15 MED ORDER — HYDROMORPHONE HCL 1 MG/ML IJ SOLN
0.5000 mg | INTRAMUSCULAR | Status: DC | PRN
  Administered 2021-10-15 – 2021-10-16 (×4): 0.5 mg via INTRAVENOUS
  Filled 2021-10-15 (×5): qty 0.5

## 2021-10-15 MED ORDER — ALUM & MAG HYDROXIDE-SIMETH 200-200-20 MG/5ML PO SUSP
30.0000 mL | Freq: Once | ORAL | Status: AC
  Administered 2021-10-15: 16:00:00 30 mL via ORAL
  Filled 2021-10-15: qty 30

## 2021-10-15 SURGICAL SUPPLY — 15 items

## 2021-10-15 NOTE — ED Notes (Signed)
Taken to EGD at this time.

## 2021-10-15 NOTE — Progress Notes (Signed)
Pt doesn't want to wear ATC for humidification.  Pt is on RA. ATC set up in room if pt changes her mind.

## 2021-10-15 NOTE — ED Notes (Signed)
Patient reporting no relief, patient visibly uncomfortable. Providers made aware. Hospital bed ordered for patient comfort.

## 2021-10-15 NOTE — Progress Notes (Signed)
FPTS Brief Progress Note  S: Patient is awake and sitting on the bed.  She reported that she is still feeling nauseous and just had an emesis prior to my arrival.  She denies any blood in her vomit bust report significant abdominal pain that's unchanged from previous report. Denies any chest pain, palpitations or light headiness.   O: BP (!) 184/112    Pulse 85    Temp 98.9 F (37.2 C) (Oral)    Resp 19    LMP  (LMP Unknown)    SpO2 98%    General:Awake, well appearing, mild discomfort HEENT: Trach in place CV: RRR, no murmurs, normal S1/S2 Pulm: CTAB, good WOB on RA Abd: Soft, no distension, diffusely tender Ext: No BLE edema,  A/P: Abdominal pain with coffee-ground emesis Patient reported she recently had emesis as nonbloody prior to my arrival.  Endorses significant abdominal pain. -GI following, appreciate recs -one-time dose of f Dilaudid 0.5 mg -Continue n.p.o. -Continue plan per day team  - Orders reviewed. Labs for AM ordered, which was adjusted as needed.    Alen Bleacher, MD 10/15/2021, 1:42 AM PGY-1, [facility] Family Medicine Night Resident  Please page [phone] with questions.

## 2021-10-15 NOTE — Progress Notes (Signed)
Family Medicine Teaching Service Daily Progress Note Intern Pager: [phone]  Patient name: [name] Medical record number: [id number] Date of birth: 1971/06/27 Age: 51 y.o. Gender: female  Primary Care Provider: [name], MD Consultants: GI Code Status: Full  Pt Overview and Major Events to Date:  10/14/21 - Patient admitted  Assessment and Plan:  [name] is a 51 y.o. female presenting with coffee ground emesis and abdominal pain in setting of recurrent nausea and vomiting . PMH is significant for DM2, CKD, HTN, Subglottic stenosis s/p tracheostomy, OSA not on CPAP, obesity, chronic abdominal pain, GERD, HLD, COPD, HFpEF, Depression   Abdominal pain w/ coffee ground emesis Patient with hx of normal EGD and gastric emptying study 12/2020. Patient to receive EGD by GI and to remain on IV Protonix. Procedure timing yet to be determined. Patient troponins are flat at 5 and 6,  CBC showing a Hgb of 11.2 down from 12.3  yesterday. Patient in significant distress from abdominal pain, with little relief when using dilaudid. Will try GI cocktail of maalox/mylanta with lidocaine. -GI cocktail: Alum&mag hydroxide-simeth with 2% lidocaine -Protonix 40 mg tablet -IV Zofran -NPO -Carfate 1g q6h -AM CBC -Dilaudid 0.5 mg q6h -Tylenol 1000 mg IV q6h prn  Borderline prolonged QTC  Patient on scheduled Zofran with QTC of 497 -EKG -Assess nausea  Hypertensive Emergency   HTN Patient BP remain elevated at 170-190's/100-130's. Patient receiving IV labetalol q6h and lisinopril daily. BP most recently in 130's/90's. Will hold labetalol and monitor pressure. -d/c Labetalol to 20 mg q6h  -continue lisinopril 20 mg daily  Hypokalemia K this am was 2.9, patient K was repleted -Oral K 40 meq -AM BMP -Continue to monitor   AKI   CKD IIIa (improved) Cr on admission was 1.31 with BUN of 13.Creatinine today 1.03 with BUN 8.  Baseline Cr 1.06-1.10.    DM2 Patient NPO, but on SSI when meals resumed. Sugars have been well controlled between 72-99   All other conditions chronic and stable GERD HLD COPD HFpEF Depression OSA  FEN/GI: clears diet PPx: SCD's Dispo:Home tomorrow. .   Subjective:  Patient in significant amount of distress from abdominal pain, that has steadily gotten worse and only response to the dilaudid for short period of hour or so. Notes that she also has a headache, and did have a BM today.   Objective: Temp:  [98.9 F (37.2 C)-99.4 F (37.4 C)] 98.9 F (37.2 C) (01/23 1305) Pulse Rate:  [85-109] 93 (01/24 0435) Resp:  [19-30] 21 (01/24 0435) BP: (172-214)/(104-132) 182/107 (01/24 0435) SpO2:  [96 %-100 %] 96 % (01/24 0435) Physical Exam: General: Ill appearing, well nourished, African American woman, in distress Cardiovascular: RRR, NRMG Respiratory: CTABL Abdomen: Soft, TTP in epigastric region, non-distended Extremities: Moving all extremities independently  Laboratory: Recent Labs  Lab 10/12/21 1911 10/13/21 1450 10/14/21 1050  WBC 10.2 10.2 11.0*  HGB 11.6* 11.5* 12.3  HCT 36.1 36.5 38.0  PLT 401* 389 384   Recent Labs  Lab 10/12/21 1911 10/13/21 1450 10/14/21 1050  NA 140 135 138  K 3.1* 3.1* 4.4  CL 101 99 102  CO2 21* 22 22  BUN 21* 13 13  CREATININE 1.28* 1.21* 1.31*  CALCIUM 9.8 9.2 9.3  PROT 9.0* 8.4* 8.4*  BILITOT 0.5 0.5 2.1*  ALKPHOS 101 102 111  ALT 23 28 28   AST 21 27 46*  GLUCOSE 213* 245* 107*      Imaging/Diagnostic Tests:   Holley Bouche, MD  10/15/2021, 5:33 AM PGY-1, [place] Intern pager: [phone], text pages welcome

## 2021-10-15 NOTE — Anesthesia Postprocedure Evaluation (Signed)
Anesthesia Post Note  Patient: [name]  Procedure(s) Performed: ESOPHAGOGASTRODUODENOSCOPY (EGD) WITH PROPOFOL BIOPSY     Patient location during evaluation: Endoscopy Anesthesia Type: MAC Level of consciousness: awake and alert Pain management: pain level controlled Vital Signs Assessment: post-procedure vital signs reviewed and stable Respiratory status: spontaneous breathing, nonlabored ventilation, respiratory function stable and patient connected to tracheostomy mask oxygen Cardiovascular status: stable and blood pressure returned to baseline Postop Assessment: no apparent nausea or vomiting Anesthetic complications: no   No notable events documented.  Last Vitals:  Vitals:   10/15/21 1030 10/15/21 1039  BP: (!) 154/91 (!) 160/95  Pulse: 91 91  Resp: (!) 24 (!) 23  Temp:    SpO2: 98% 97%    Last Pain:  Vitals:   10/15/21 1019  TempSrc:   PainSc: 8                  Oumou Smead P Kourtlynn Trevor

## 2021-10-15 NOTE — ED Notes (Signed)
Pt continues to complain of abdominal pain and nausea. MD at bedside.

## 2021-10-15 NOTE — Progress Notes (Signed)
FPTS Brief Progress Note  S: Went to see [name] during night rounds  I arrived at patient's bedside, found patient was awake and laying in bed crying.  Patient reports she still have significant abdominal pain in the epigastric region.  She said her pain meds do not last long enough and requested to increase dose or increase frequency of Dilaudid.  She endorses feeling nauseous and denies chest pain.    O: BP (!) 181/111 (BP Location: Right Arm)    Pulse (!) 117    Temp 98.5 F (36.9 C) (Oral)    Resp 20    Ht 5\' 1"  (1.549 m)    Wt 104.3 kg    LMP  (LMP Unknown)    SpO2 100%    BMI 43.45 kg/m   General:Awake, well appearing,crying  CV: RRR, no murmurs, normal S1/S2 Pulm: CTAB, good WOB on RA Abd: Soft, obese, epigastric tenderness, no rebound tenderness or guarding. BS+ Skin: dry, warm Ext: No BLE edema   A/P: Abdominal pain Patient crying on arrival due to abdominal pain.  Endorses midepigastric tenderness on exam. -Increase Dilaudid dose to every 4 hours as needed -Continue plans per day team - Orders reviewed. Labs for AM ordered, which was adjusted as needed.    Alen Bleacher, MD 10/15/2021, 8:52 PM PGY-1, [place] Night Resident  Please page [phone] with questions.

## 2021-10-15 NOTE — Progress Notes (Signed)
FPTS Interim Progress Note  S:Patient by RN that patient still having continued abdominal pain.  Went to see patient and she is says that she is still having abdominal pain and did have a bowel movement that was loose.  She did not feel any relief after bowel movement.  She said that the nausea medication likely ondansetron and "the pain medicine that started with a D" was helpful.   O: BP (!) 134/92 (BP Location: Left Wrist)    Pulse 95    Temp 98.5 F (36.9 C) (Oral)    Resp 18    Ht 5\' 1"  (1.549 m)    Wt 104.3 kg    LMP  (LMP Unknown)    SpO2 100%    BMI 43.45 kg/m   Gen: Able to respond to questions, cough + mucous with trach, appeared uncomfortable Abdomen: Bowel sounds normoactive in 4 quadrants, soft, mildly generalized tenderness to palpation generalized, some rebound tenderness in RLQ and LLQ Resp: no iWOB  A/P: CTA on 1/21 had patent IMA/SMA/celiac.  Less likely to be ischemic in etiology but can consider work-up if lactic acid returns very elevated.  Will obtain abdominal x-ray given patient had EGD today. -Dilaudid 0.5 mg every 6 hours -Lactic acid repeat -Abdominal x-ray -Monitor clinically  Gerrit Heck, MD 10/15/2021, 6:47 PM PGY-1, [place] Medicine Service pager [phone]

## 2021-10-15 NOTE — Plan of Care (Signed)
Pain was received in bed and stated that her pain was a 10/10. Pt could be observed guarding her abdomen as she rocked back and forth in the bed. Pt was administered her scheduled dilaudid as soon as it became available, however, she says that it did little to relieve her pain. Pt has refused to participate in the question portion of her admission until her pain has decreased, but allowed staff to look at her skin and place her on oxygen. Family Medicine was contacted regarding her unresolved pain and RN was informed that someone would be by to reassess her shortly.   Problem: Education: Goal: Knowledge of disease or condition will improve Outcome: Progressing Goal: Knowledge of the prescribed therapeutic regimen will improve Outcome: Progressing Goal: Individualized Educational Video(s) Outcome: Progressing   Problem: Activity: Goal: Ability to tolerate increased activity will improve Outcome: Progressing Goal: Will verbalize the importance of balancing activity with adequate rest periods Outcome: Progressing   Problem: Respiratory: Goal: Ability to maintain a clear airway will improve Outcome: Progressing Goal: Levels of oxygenation will improve Outcome: Progressing Goal: Ability to maintain adequate ventilation will improve Outcome: Progressing

## 2021-10-15 NOTE — Progress Notes (Signed)
Went to see patient bedside after being informed by nurse that patient was complaining of abdominal pain. On arrival patient was grimacing in pain and said she woke up around 3am due to mid epigastric pain.  She said dilaudid given before midnight improved her pain until the time she woke up. She reports 3 non bloody emesis since I saw her earlier. On exam abdomen is not distended but has mid epigastric tenderness on palpation. No rebound tenderness or guarding. Ordered 1mg  of dilaudid. Will continue to monitor her closely.  Alen Bleacher, MD PGY-1, [place] Medicine Resident  Please page [phone] with questions.

## 2021-10-15 NOTE — Anesthesia Preprocedure Evaluation (Addendum)
Anesthesia Evaluation  Patient identified by MRN, date of birth, ID band Patient awake    Reviewed: Allergy & Precautions, NPO status , Patient's Chart, lab work & pertinent test results  Airway Mallampati: II      Comment: Trach in situ  Dental   Pulmonary asthma , sleep apnea , COPD,  COPD inhaler, former smoker,  Trach 06/18 2/2 tracheal stenosis    + decreased breath sounds      Cardiovascular hypertension, Pt. on medications +CHF   Rhythm:Regular Rate:Normal     Neuro/Psych  Headaches, Anxiety Depression    GI/Hepatic Neg liver ROS, hiatal hernia, GERD  ,Abdominal pain with emesis   Endo/Other  diabetes, Type 1, Insulin Dependent  Renal/GU CRF and ARFRenal disease  negative genitourinary   Musculoskeletal  (+) Arthritis , Osteoarthritis,    Abdominal Normal abdominal exam  (+)   Peds  Hematology  (+) anemia ,   Anesthesia Other Findings   Reproductive/Obstetrics                           Anesthesia Physical Anesthesia Plan  ASA: 3  Anesthesia Plan: MAC   Post-op Pain Management:    Induction: Intravenous  PONV Risk Score and Plan: 2 and Propofol infusion and Treatment may vary due to age or medical condition  Airway Management Planned: Tracheostomy, Natural Airway and Simple Face Mask  Additional Equipment: None  Intra-op Plan:   Post-operative Plan:   Informed Consent: I have reviewed the patients History and Physical, chart, labs and discussed the procedure including the risks, benefits and alternatives for the proposed anesthesia with the patient or authorized representative who has indicated his/her understanding and acceptance.     Dental advisory given  Plan Discussed with: CRNA  Anesthesia Plan Comments: (Lab Results      Component                Value               Date                      WBC                      11.0 (H)            10/14/2021                 HGB                      12.3                10/14/2021                HCT                      38.0                10/14/2021                MCV                      83.7                10/14/2021                PLT  384                 10/14/2021           Lab Results      Component                Value               Date                      NA                       138                 10/14/2021                K                        4.4                 10/14/2021                CO2                      22                  10/14/2021                GLUCOSE                  107 (H)             10/14/2021                BUN                      13                  10/14/2021                CREATININE               1.31 (H)            10/14/2021                CALCIUM                  9.3                 10/14/2021                GFRNONAA                 49 (L)              10/14/2021           ECHO 2018: - Left ventricle: The cavity size was normal. Wall thickness was  normal. Systolic function was normal. The estimated ejection  fraction was in the range of 60% to 65%. Wall motion was normal;  there were no regional wall motion abnormalities. Doppler  parameters are consistent with abnormal left ventricular  relaxation (grade 1 diastolic dysfunction).  - Mitral valve: Valve area by pressure half-time: 0.72 cm^2. )       Anesthesia Quick Evaluation

## 2021-10-15 NOTE — Transfer of Care (Signed)
Immediate Anesthesia Transfer of Care Note  Patient: [name]  Procedure(s) Performed: ESOPHAGOGASTRODUODENOSCOPY (EGD) WITH PROPOFOL BIOPSY  Patient Location: PACU  Anesthesia Type:General  Level of Consciousness: drowsy and patient cooperative  Airway & Oxygen Therapy: Patient Spontanous Breathing  Post-op Assessment: Report given to RN and Post -op Vital signs reviewed and stable  Post vital signs: Reviewed and stable  Last Vitals:  Vitals Value Taken Time  BP 150/91 10/15/21 1019  Temp    Pulse 94 10/15/21 1020  Resp 25 10/15/21 1020  SpO2 98 % 10/15/21 1020  Vitals shown include unvalidated device data.  Last Pain:  Vitals:   10/15/21 0909  TempSrc: Temporal  PainSc: 7          Complications: No notable events documented.

## 2021-10-15 NOTE — ED Notes (Signed)
Admit MD, Dr.Mcdiarmid notified of bp of 214/113. Labetalol IVP given as scheduled. Will continue to monitor.

## 2021-10-15 NOTE — ED Notes (Signed)
Patient requesting pain meds. PRNs ordered from pharmacy

## 2021-10-15 NOTE — Op Note (Signed)
[facility] Patient Name: [name] Procedure Date : 10/15/2021 MRN: [id number] Attending MD: Thornton Park MD, MD Date of Birth: 07/13/1971 CSN: [id number] Age: 51 Admit Type: Inpatient Procedure:                Upper GI endoscopy Indications:              Abdominal pain, Coffee-ground emesis, Nausea with                            vomiting Providers:                Thornton Park MD, MD, Grace Isaac, RN, Tyna Jaksch Technician Referring MD:              Medicines:                Monitored Anesthesia Care Complications:            No immediate complications. Estimated blood loss:                            Minimal. Estimated Blood Loss:     Estimated blood loss was minimal. Estimated blood                            loss was minimal. Procedure:                Pre-Anesthesia Assessment:                           - Prior to the procedure, a History and Physical                            was performed, and patient medications and                            allergies were reviewed. The patient's tolerance of                            previous anesthesia was also reviewed. The risks                            and benefits of the procedure and the sedation                            options and risks were discussed with the patient.                            All questions were answered, and informed consent                            was obtained. Prior Anticoagulants: The patient has                            taken no previous anticoagulant or antiplatelet  agents. ASA Grade Assessment: II - A patient with                            mild systemic disease. After reviewing the risks                            and benefits, the patient was deemed in                            satisfactory condition to undergo the procedure.                           After obtaining informed consent, the endoscope  was                            passed under direct vision. Throughout the                            procedure, the patient's blood pressure, pulse, and                            oxygen saturations were monitored continuously. The                            [id number]-H190 ([id number] Olympus endoscope was introduced                            through the mouth, and advanced to the third part                            of duodenum. The upper GI endoscopy was                            accomplished without difficulty. The patient                            tolerated the procedure well. Scope In: Scope Out: Findings:      The examined esophagus was normal. The z-line is located 37cm from the       incisors. Biopsies were taken from the mid and distal esophagus with a       cold forceps for histology. Estimated blood loss was minimal.      Localized mild mucosal changes characterized by congestion were found at       the pylorus. The mucosa has a slightly verrucous appearance in the       pre-pyloric area. Biopsies were taken from the antrum, body, and fundus       with a cold forceps for histology. Estimated blood loss was minimal.      Bilious fluid was found in the gastric fundus and in the gastric body.      Localized mildly erythematous mucosa without active bleeding and with no       stigmata of bleeding was found in the duodenal bulb. The remainder of       the duodenal mucosa was normal. Biopsies were taken with a cold forceps  for histology. Estimated blood loss was minimal. Impression:               - Normal esophagus. Biopsied.                           - Congested mucosa in the pylorus. This is very                            subtle and of unclear clinical significance.                            Biopsied.                           - Bile present in the stomach.                           - Mild duodenal erythema. Biopsied.                           - The examination was otherwise  normal. Recommendation:           - Patient has a contact number available for                            emergencies. The signs and symptoms of potential                            delayed complications were discussed with the                            patient. Return to normal activities tomorrow.                            Written discharge instructions were provided to the                            patient.                           - Resume previous diet. May advance as tolerated.                           - Continue present medications.                           - Avoid all NSAIDs.                           - Await pathology results.                           - No aspirin, ibuprofen, naproxen, or other                            non-steroidal anti-inflammatory drugs. Procedure Code(s):        --- Professional ---  02409, Esophagogastroduodenoscopy, flexible,                            transoral; with biopsy, single or multiple Diagnosis Code(s):        --- Professional ---                           K31.89, Other diseases of stomach and duodenum                           R10.9, Unspecified abdominal pain                           K92.0, Hematemesis                           R11.2, Nausea with vomiting, unspecified CPT copyright 2019 [employer] Association. All rights reserved. The codes documented in this report are preliminary and upon coder review may  be revised to meet current compliance requirements. Thornton Park MD, MD 10/15/2021 10:20:43 AM This report has been signed electronically. Number of Addenda: 0

## 2021-10-16 ENCOUNTER — Encounter (HOSPITAL_COMMUNITY): Payer: Self-pay | Admitting: Gastroenterology

## 2021-10-16 DIAGNOSIS — R1010 Upper abdominal pain, unspecified: Secondary | ICD-10-CM

## 2021-10-16 DIAGNOSIS — K920 Hematemesis: Secondary | ICD-10-CM | POA: Diagnosis not present

## 2021-10-16 DIAGNOSIS — R112 Nausea with vomiting, unspecified: Secondary | ICD-10-CM | POA: Diagnosis not present

## 2021-10-16 LAB — BASIC METABOLIC PANEL
Anion gap: 10 (ref 5–15)
Anion gap: 13 (ref 5–15)
BUN: 5 mg/dL — ABNORMAL LOW (ref 6–20)
BUN: 9 mg/dL (ref 6–20)
CO2: 20 mmol/L — ABNORMAL LOW (ref 22–32)
CO2: 21 mmol/L — ABNORMAL LOW (ref 22–32)
Calcium: 8.8 mg/dL — ABNORMAL LOW (ref 8.9–10.3)
Calcium: 9.3 mg/dL (ref 8.9–10.3)
Chloride: 101 mmol/L (ref 98–111)
Chloride: 101 mmol/L (ref 98–111)
Creatinine, Ser: 1.04 mg/dL — ABNORMAL HIGH (ref 0.44–1.00)
Creatinine, Ser: 1.14 mg/dL — ABNORMAL HIGH (ref 0.44–1.00)
GFR, Estimated: >60 mL/min (ref >60)
GFR, Estimated: 58 mL/min — ABNORMAL LOW (ref >60)
Glucose, Bld: 65 mg/dL — ABNORMAL LOW (ref 70–99)
Glucose, Bld: 95 mg/dL (ref 70–99)
Potassium: 3 mmol/L — ABNORMAL LOW (ref 3.5–5.1)
Potassium: 3.6 mmol/L (ref 3.5–5.1)
Sodium: 131 mmol/L — ABNORMAL LOW (ref 135–145)
Sodium: 135 mmol/L (ref 135–145)

## 2021-10-16 LAB — GLUCOSE, CAPILLARY
Glucose-Capillary: 72 mg/dL (ref 70–99)
Glucose-Capillary: 65 mg/dL — ABNORMAL LOW (ref 70–99)
Glucose-Capillary: 102 mg/dL — ABNORMAL HIGH (ref 70–99)
Glucose-Capillary: 104 mg/dL — ABNORMAL HIGH (ref 70–99)
Glucose-Capillary: 98 mg/dL (ref 70–99)
Glucose-Capillary: 132 mg/dL — ABNORMAL HIGH (ref 70–99)

## 2021-10-16 LAB — HEMOGLOBIN A1C
Hgb A1c MFr Bld: 8.3 % — ABNORMAL HIGH (ref 4.8–5.6)
Mean Plasma Glucose: 192 mg/dL

## 2021-10-16 LAB — CBC
HCT: 36.9 % (ref 36.0–46.0)
Hemoglobin: 11.7 g/dL — ABNORMAL LOW (ref 12.0–15.0)
MCH: 26.7 pg (ref 26.0–34.0)
MCHC: 31.7 g/dL (ref 30.0–36.0)
MCV: 84.1 fL (ref 80.0–100.0)
Platelets: 384 10*3/uL (ref 150–400)
RBC: 4.39 MIL/uL (ref 3.87–5.11)
RDW: 14.6 % (ref 11.5–15.5)
WBC: 10.9 10*3/uL — ABNORMAL HIGH (ref 4.0–10.5)
nRBC: 0 % (ref 0.0–0.2)

## 2021-10-16 MED ORDER — OXYCODONE HCL 5 MG PO TABS
5.0000 mg | ORAL_TABLET | ORAL | Status: DC | PRN
  Administered 2021-10-16: 13:00:00 5 mg via ORAL
  Filled 2021-10-16: qty 1

## 2021-10-16 MED ORDER — OXYCODONE HCL 5 MG PO TABS
10.0000 mg | ORAL_TABLET | ORAL | Status: DC | PRN
  Administered 2021-10-16 – 2021-10-17 (×5): 10 mg via ORAL
  Filled 2021-10-16 (×5): qty 2

## 2021-10-16 MED ORDER — POTASSIUM CHLORIDE CRYS ER 20 MEQ PO TBCR
40.0000 meq | EXTENDED_RELEASE_TABLET | Freq: Once | ORAL | Status: AC
  Administered 2021-10-16: 09:00:00 40 meq via ORAL
  Filled 2021-10-16: qty 2

## 2021-10-16 MED ORDER — HYOSCYAMINE SULFATE 0.125 MG SL SUBL
0.2500 mg | SUBLINGUAL_TABLET | Freq: Once | SUBLINGUAL | Status: AC
  Administered 2021-10-16: 13:00:00 0.25 mg via SUBLINGUAL
  Filled 2021-10-16: qty 2

## 2021-10-16 MED ORDER — ALUM & MAG HYDROXIDE-SIMETH 200-200-20 MG/5ML PO SUSP
30.0000 mL | Freq: Once | ORAL | Status: AC
  Administered 2021-10-16: 13:00:00 30 mL via ORAL
  Filled 2021-10-16: qty 30

## 2021-10-16 MED ORDER — HYDROMORPHONE HCL 1 MG/ML IJ SOLN
0.5000 mg | Freq: Once | INTRAMUSCULAR | Status: AC
  Administered 2021-10-16: 18:00:00 0.5 mg via INTRAVENOUS
  Filled 2021-10-16: qty 0.5

## 2021-10-16 MED ORDER — ACETAMINOPHEN 325 MG PO TABS
650.0000 mg | ORAL_TABLET | ORAL | Status: DC | PRN
  Administered 2021-10-16 – 2021-10-17 (×3): 650 mg via ORAL
  Filled 2021-10-16 (×3): qty 2

## 2021-10-16 MED ORDER — OXYCODONE HCL 5 MG PO TABS
5.0000 mg | ORAL_TABLET | Freq: Once | ORAL | Status: AC
Start: 2021-10-16 — End: 2021-10-16
  Administered 2021-10-16: 15:00:00 5 mg via ORAL
  Filled 2021-10-16: qty 1

## 2021-10-16 MED ORDER — LIDOCAINE VISCOUS HCL 2 % MT SOLN
15.0000 mL | Freq: Once | OROMUCOSAL | Status: AC
  Administered 2021-10-16: 13:00:00 15 mL via ORAL
  Filled 2021-10-16: qty 15

## 2021-10-16 MED ORDER — LABETALOL HCL 5 MG/ML IV SOLN: 5.0000 mg | Freq: Four times a day (QID) | INTRAVENOUS | Status: DC | PRN

## 2021-10-16 NOTE — Progress Notes (Addendum)
Got paged by patient's nurse about patient requesting to get alternating Dilaudid and her scheduled oxycodone for pain.  Inform nurse the patient made request yesterday to switch to her home oxycodone because the Dilaudid was not making much of an improvement with her pain.  At that time the Dilaudid was increased in frequency to every 4 hours as needed.  Most recently was switched to 10 mg oxycodone every 4 hours as needed.  Informed nurse at this time I am unable to grant this request considering patient's kidney function and adverse effect of combing both medications.  Informed nurse I will be seeing patient very soon.  Alen Bleacher, MD PGY-1, [place] Medicine Resident  Please page [phone] with questions.

## 2021-10-16 NOTE — Progress Notes (Signed)
Pt request minimal support from RT involving Trach care. Pt says she has been doing this for a while and showed she knows how to suction herself. Although pt doesn't show signs of secretions. RT informed pt that if any signs of distress or discomfort arise to contact RN so that she can contact RT. Rt will continue to monitor situation through night.

## 2021-10-16 NOTE — Progress Notes (Signed)
Daily Rounding Note  10/16/2021, 10:07 AM  LOS: 1 day   SUBJECTIVE:   Chief complaint:      Ongoing epigastric pain and nausea overnight.  After pleading/negotiating with providers to get more Dilaudid, covering MD increased frequency of Dilaudid to every 4 hours..  OBJECTIVE:         Vital signs in last 24 hours:    Temp:  [98.2 F (36.8 C)-98.5 F (36.9 C)] 98.3 F (36.8 C) (01/25 0822) Pulse Rate:  [91-117] 93 (01/25 0822) Resp:  [17-26] 19 (01/25 0822) BP: (134-189)/(91-112) 189/112 (01/25 0822) SpO2:  [69 %-100 %] 69 % (01/25 0822) FiO2 (%):  [28 %] 28 % (01/24 1516) Weight:  [104.3 kg] 104.3 kg (01/24 1103) Last BM Date: 10/15/21 Filed Weights   10/15/21 1103  Weight: 104.3 kg   General: Sting quietly when I arrived.  Awakens easily.  Does not look ill. Heart: RRR. Chest: No labored breathing or cough. Abdomen: Soft.  Epigastric tenderness without guarding or rebound.  Obese without distention.  Bowel sounds active. Neuro/Psych: Remains anxious though this is improved compared with a couple of days ago when she was in the ED.  Intake/Output from previous day: 01/24 0701 - 01/25 0700 In: 1435.2 [I.V.:1370.6; IV Piggyback:64.7] Out: -   Intake/Output this shift: No intake/output data recorded.  Lab Results: Recent Labs    10/14/21 1050 10/15/21 1124 10/16/21 0204  WBC 11.0* 9.9 10.9*  HGB 12.3 11.2* 11.7*  HCT 38.0 34.5* 36.9  PLT 384 383 384   BMET Recent Labs    10/15/21 1124 10/15/21 2131 10/16/21 0204  NA 136 133* 131*  K 2.9* 4.8 3.0*  CL 105 102 101  CO2 22 18* 20*  GLUCOSE 94 82 65*  BUN 8 9 5*  CREATININE 1.03* 1.11* 1.04*  CALCIUM 8.7* 8.7* 8.8*   LFT Recent Labs    10/13/21 1450 10/14/21 1050 10/14/21 1720  PROT 8.4* 8.4*  --   ALBUMIN 3.8 4.0  --   AST 27 46*  --   ALT 28 28  --   ALKPHOS 102 111  --   BILITOT 0.5 2.1*  --   BILIDIR  --   --  0.1    PT/INR Recent Labs    10/14/21 1720  LABPROT 13.3  INR 1.0   Hepatitis Panel No results for input(s): HEPBSAG, HCVAB, HEPAIGM, HEPBIGM in the last 72 hours.  Studies/Results: DG Abd 1 View  Result Date: 10/15/2021 CLINICAL DATA:  Abdominal pain EXAM: ABDOMEN - 1 VIEW COMPARISON:  None. FINDINGS: Normal abdominal gas pattern. No gross free intraperitoneal gas. No organomegaly varicosed ectomy clips noted within the right upper quadrant. L4-5 lumbar fusion with instrumentation has been performed. No acute bone abnormality. IMPRESSION: Normal abdominal gas pattern. Electronically Signed   By: Fidela Salisbury M.D.   On: 10/15/2021 20:15   DG Chest Portable 1 View  Result Date: 10/14/2021 CLINICAL DATA:  Epigastric pain EXAM: PORTABLE CHEST 1 VIEW COMPARISON:  08/22/2021 FINDINGS: Stable tracheostomy tube. Stable heart size. Low lung volumes. No focal airspace consolidation, pleural effusion, or pneumothorax. IMPRESSION: No active disease. Electronically Signed   By: Davina Poke D.O.   On: 10/14/2021 15:39   US Abdomen Limited RUQ (LIVER/GB)  Result Date: 10/14/2021 CLINICAL DATA:  Abdominal pain, nausea, vomiting EXAM: ULTRASOUND ABDOMEN LIMITED RIGHT UPPER QUADRANT COMPARISON:  Sonogram done on 02/28/2014, CT abdomen done on 10/11/2021 FINDINGS: Gallbladder: Gallbladder is not seen consistent  with cholecystectomy. Common bile duct: Diameter: 7 mm Liver: There is increased echogenicity in the liver suggesting fatty infiltration. There is 1.7 cm cystic structure in the porta hepatis, possibly a hepatic cyst. This finding was not distinctly seen in the previous CT. Portal vein is patent on color Doppler imaging with normal direction of blood flow towards the liver. Other: None. IMPRESSION: Status post cholecystectomy.  Fatty liver. Electronically Signed   By: Elmer Picker M.D.   On: 10/14/2021 13:01    Scheduled Meds:  alum & mag hydroxide-simeth  30 mL Oral Once   And   lidocaine   15 mL Oral Once   aspirin EC  81 mg Oral Daily   atorvastatin  20 mg Oral Daily   buPROPion  150 mg Oral BID   dicyclomine  20 mg Oral BID   docusate sodium  100 mg Oral BID   gabapentin  300 mg Oral BID   hyoscyamine  0.25 mg Sublingual Once   insulin aspart  0-15 Units Subcutaneous Q4H   lisinopril  20 mg Oral Daily   loratadine  10 mg Oral Daily   melatonin  3 mg Oral QHS   mometasone-formoterol  2 puff Inhalation Daily   ondansetron  4 mg Intravenous Q6H   pantoprazole  40 mg Oral BID   potassium chloride  40 mEq Oral Once   sertraline  100 mg Oral QHS   sucralfate  1 g Oral Q6H   Continuous Infusions:  sodium chloride 125 mL/hr at 10/15/21 1437   sodium chloride 144 mL/hr at 10/15/21 0950   acetaminophen 1,000 mg (10/16/21 0846)   PRN Meds:.albuterol, labetalol, oxyCODONE, polyethylene glycol  ASSESMENT:      CGE, epigastric pain.  10/15/2021 EGD with subtle congestion in mucosa at pylorus of unclear significance.  This was biopsied.  Bile present in stomach.  Mild duodenal erythema biopsied.  Symptoms out of proportion to endoscopic findings.  Suspicion for underlying gastroparesis and type I diabetic however gastric emptying study was normal in April 2022.  No chronic or acute GI findings on CT or ultrasound.  Symptoms refractory to Carafate, PPI,'s antispasmodic (Bentyl, hyoscyamine).       Fatty liver.    Anxiety.  This contributes significantly to her experience of pain.  Hyponatremia.     Hypokalemia.  IDDM.   PLAN     Reglan not an option as this caused panic in the past.  Will discuss management strategy with Dr. Tarri Glenn.    [name]  10/16/2021, 10:07 AM Phone [phone]

## 2021-10-16 NOTE — Progress Notes (Signed)
Family Medicine Teaching Service Daily Progress Note Intern Pager: [phone]  Patient name: [name] Medical record number: [id number] Date of birth: Jan 13, 1971 Age: 51 y.o. Gender: female  Primary Care Provider: [name], MD Consultants: GI Code Status: Full  Pt Overview and Major Events to Date:  10/14/21 - Patient admitted  Assessment and Plan:  [name] is a 51 y.o. female presenting with coffee ground emesis and abdominal pain in setting of recurrent nausea and vomiting . PMH is significant for DM2, CKD, HTN, Subglottic stenosis s/p tracheostomy, OSA not on CPAP, obesity, chronic abdominal pain, GERD, HLD, COPD, HFpEF, Depression   Abdominal pain w/ coffee ground emesis Patient with significant abdominal pain yesterday, last night, and today. Patient presently on Dilaudid 0.5mg  IV q4h prn. Patient continues to have uncontrolled abdominal pain, not relieved by bowel movement, with normal EGD. Lactic acid was 1.1, and abdominal x-ray was normal, less concerning for ischemic bowel. Eitology of pain unknown at this time, however patient has history of chronic abdominal pain.  -Continue Protonix -Continue Zofran -Carfate 1 g q6h -AM CBC, BMP -Start oxycodone 10 mg q4h prn -d/c Dilaudid 0.5mg  q4h prn, consider restarting if pain not treated with oxycodone -Tylenol 1000 mg IV q6h  Borderline prolonged QTC  QTC stable on most recent EKG at 492.  -Continue to monitor while on QTC prolonging agents  Hypertensive Emergency   HTN Patient BP continues to be hypertensive, oscillating between 130-180's/90-110's. - -Labetalol q6h prn for SBP > 180 or DBP > 100   Hypokalemia K 3.0 this morning, previously 4.8. Will replete. Patient continues to have emesis, likely source of hypokalemia.  -Continue to monitor  DM2 Patient CBG have been low ranging from 65-128. Patient has had poor PO, and continues to suffer from abdominal pain, nausea, and  vomiting.  -Continue to monitor  All other conditions chronic and stable GERD HLD COPD HFpEF Depression OSA  FEN/GI: Clears diet PPx: SCD's Dispo:Home pending clinical improvement . Barriers include pain management.   Subjective:  Patient in significant pain and distress today, says her pain has been unbearable and poorly controlled throughout the night.   Objective: Temp:  [97.7 F (36.5 C)-98.5 F (36.9 C)] 98.2 F (36.8 C) (01/25 0430) Pulse Rate:  [84-117] 101 (01/25 0430) Resp:  [17-26] 18 (01/25 0736) BP: (134-223)/(91-123) 169/98 (01/25 0430) SpO2:  [97 %-100 %] 100 % (01/25 0430) FiO2 (%):  [28 %] 28 % (01/24 1516) Weight:  [104.3 kg] 104.3 kg (01/24 1103) Physical Exam: General: Distressed, tearful, grimacing Cardiovascular: RRR, NRMG Respiratory: CTABL Abdomen: Soft, non-distended, TTP in epigastric region, non TTP in other regions Extremities: Moving all extremities independently, no edema  Laboratory: Recent Labs  Lab 10/14/21 1050 10/15/21 1124 10/16/21 0204  WBC 11.0* 9.9 10.9*  HGB 12.3 11.2* 11.7*  HCT 38.0 34.5* 36.9  PLT 384 383 384   Recent Labs  Lab 10/12/21 1911 10/13/21 1450 10/14/21 1050 10/15/21 1124 10/15/21 2131 10/16/21 0204  NA 140 135 138 136 133* 131*  K 3.1* 3.1* 4.4 2.9* 4.8 3.0*  CL 101 99 102 105 102 101  CO2 21* 22 22 22  18* 20*  BUN 21* 13 13 8 9  5*  CREATININE 1.28* 1.21* 1.31* 1.03* 1.11* 1.04*  CALCIUM 9.8 9.2 9.3 8.7* 8.7* 8.8*  PROT 9.0* 8.4* 8.4*  --   --   --   BILITOT 0.5 0.5 2.1*  --   --   --   ALKPHOS 101 102 111  --   --   --  ALT 23 28 28   --   --   --   AST 21 27 46*  --   --   --   GLUCOSE 213* 245* 107* 94 82 65*      Imaging/Diagnostic Tests:   Holley Bouche, MD 10/16/2021, 8:21 AM PGY-1, [place] Intern pager: [phone], text pages welcome

## 2021-10-16 NOTE — Progress Notes (Signed)
Time: 2001  Patient stated that she has been complaining of left side abdominal pain and that the current pain regimen is not working for her.  She stated that the combination of rotating Oxy IR 10 mg and iv Dilaudid has helped her pain.  I will be contacting on call MD regarding this matter.  Time: 2017  On call MD will not add back Dilaudid iv at this time due to monitoring of patient's kidneys.  On call MD will come by to speak with patient again regarding pain management.  On call MD will put in order to continue telemetry since patient is still getting scheduled Zofran iv.

## 2021-10-17 DIAGNOSIS — R1010 Upper abdominal pain, unspecified: Secondary | ICD-10-CM | POA: Diagnosis not present

## 2021-10-17 DIAGNOSIS — R112 Nausea with vomiting, unspecified: Secondary | ICD-10-CM | POA: Diagnosis not present

## 2021-10-17 LAB — BASIC METABOLIC PANEL
Anion gap: 12 (ref 5–15)
BUN: 10 mg/dL (ref 6–20)
CO2: 23 mmol/L (ref 22–32)
Calcium: 9.1 mg/dL (ref 8.9–10.3)
Chloride: 101 mmol/L (ref 98–111)
Creatinine, Ser: 1.3 mg/dL — ABNORMAL HIGH (ref 0.44–1.00)
GFR, Estimated: 49 mL/min — ABNORMAL LOW (ref >60)
Glucose, Bld: 151 mg/dL — ABNORMAL HIGH (ref 70–99)
Potassium: 3.1 mmol/L — ABNORMAL LOW (ref 3.5–5.1)
Sodium: 136 mmol/L (ref 135–145)

## 2021-10-17 LAB — GLUCOSE, CAPILLARY
Glucose-Capillary: 118 mg/dL — ABNORMAL HIGH (ref 70–99)
Glucose-Capillary: 119 mg/dL — ABNORMAL HIGH (ref 70–99)
Glucose-Capillary: 124 mg/dL — ABNORMAL HIGH (ref 70–99)
Glucose-Capillary: 139 mg/dL — ABNORMAL HIGH (ref 70–99)
Glucose-Capillary: 173 mg/dL — ABNORMAL HIGH (ref 70–99)

## 2021-10-17 LAB — SURGICAL PATHOLOGY

## 2021-10-17 MED ORDER — POTASSIUM CHLORIDE 20 MEQ PO PACK
60.0000 meq | PACK | Freq: Once | ORAL | Status: AC
  Administered 2021-10-17: 60 meq via ORAL
  Filled 2021-10-17: qty 3

## 2021-10-17 MED ORDER — OXYCODONE HCL 5 MG PO TABS
10.0000 mg | ORAL_TABLET | ORAL | Status: DC
Start: 2021-10-17 — End: 2021-10-18
  Administered 2021-10-17 – 2021-10-18 (×6): 10 mg via ORAL
  Filled 2021-10-17 (×5): qty 2

## 2021-10-17 MED ORDER — LISINOPRIL 20 MG PO TABS: 40.0000 mg | ORAL_TABLET | Freq: Every day | ORAL | Status: DC

## 2021-10-17 MED ORDER — ENOXAPARIN SODIUM 60 MG/0.6ML IJ SOSY
50.0000 mg | PREFILLED_SYRINGE | INTRAMUSCULAR | Status: DC
  Administered 2021-10-17 – 2021-10-18 (×2): 50 mg via SUBCUTANEOUS
  Filled 2021-10-17 (×2): qty 0.6

## 2021-10-17 MED ORDER — ENOXAPARIN SODIUM 40 MG/0.4ML IJ SOSY: 40.0000 mg | PREFILLED_SYRINGE | INTRAMUSCULAR | Status: DC

## 2021-10-17 MED ORDER — ACETAMINOPHEN 325 MG PO TABS
650.0000 mg | ORAL_TABLET | ORAL | Status: DC
  Administered 2021-10-17 – 2021-10-18 (×7): 650 mg via ORAL
  Filled 2021-10-17 (×7): qty 2

## 2021-10-17 MED ORDER — LISINOPRIL 20 MG PO TABS
20.0000 mg | ORAL_TABLET | Freq: Once | ORAL | Status: AC
  Administered 2021-10-17: 20 mg via ORAL
  Filled 2021-10-17: qty 1

## 2021-10-17 NOTE — Progress Notes (Signed)
Time: 0019  Patient is currently in the room crying and complaining to her family members that her pain is not being controlled.  She wants the Dilaudid iv added back to her medication list.  I will contact on call MD again regarding this matter.  Time: 0037  On call MD will round on patient when able.

## 2021-10-17 NOTE — Progress Notes (Signed)
On call MD rounded on patient to discuss pain medications.

## 2021-10-17 NOTE — Hospital Course (Addendum)
[name] is a 51 y.o. female presenting with coffee ground emesis and abdominal pain in setting of recurrent nausea and vomiting . PMH is significant for DM2, CKD, HTN, Tracheal stenosis s/p tracheostomy, OSA not on CPAP, obesity, chronic abdominal pain, GERD, HLD, COPD, HFpEF, Depression. Her hospital course is detailed below.  Abdominal pain w/ coffee ground emesis Patient presents to ED for her 5th visit over the past week, with complaints of persistent nausea, vomiting, generalized abdominal pain, and now coffee ground emesis. On arrival, patient was afebrile with a pulse of 103, BP 214/123, with 100% O2 saturation. Patient received IV fluid bolus, protonix, droperidol, zofran, ativan, and dilaudid. Labs were significant for a normal lipase, Cr of 1.31, AST 46, T. Bili 2.1, GFR 49. WBC slightly elevated to 11.0, and Hgb 12.3. UA was positive for 5 ketones, and 100 protein. On imaging, RUQ u/s showed s/p cholecystectomy and fatty liver disease. CXR showed no active disease. CT angio chest/abd/pelv on 10/12/21 showed no acute intrathoracic, abdominal, or pelvic pathology, nor aortic dissection or aneurysm. CT abd/pelv w contrast on 10/11/21, showed no acute findings within the abdomen or pelvis. EKG was normal sinus rhythm without signs of ischemia. Patient had EGD that showed no concern for GI bleed, with bile present in stomach, mild duodenal erythema, and congested mucosa in the pylorus. Patient also had Upper GI barrium swallow performed that was benign showing no evidence of gastric outlet obstruction, and mild nonspecific esophageal dysmotility. Patient was started on IV dilaudid for pain control and then transitioned to home opiod regimen of Oxycodone 10 mg q4h. Patient abdominal pain was completely resolved by discharge, with no more nausea or vomiting.    Hypertensive Emergency Patient BP on admission was 214/123 and patient was complaining of a headache for the last few hours.  Patient denied any vision changes, and had chest pain associated with vomiting. BP was lowered with labetalol, and then patient had home lisinopril restarted at a higher dose of 40 mg daily. Patient pressures improved. By discharge, patient BP were stable ranging from 110-140's/ 60-90's   AKI   CKD IIIa Patient with GFR of 49 on admission, with Cr 1.31 and BUN 13. Baseline Cr is (1.06 - 1.10). Cr rose to 2.35 during admission, in the setting of increasing lisinopril. Lisinopril was held before discharge.    Hypokalemia Patient K hypokalemic during admission, patient was repleted with oral potassium. By discharge patient K was improved    Borderline prolonged QTC (stable) Patient was on scheduled Zofran q4h for nausea and vomiting with QTc of 497 on admission. Patient EKG was monitored during admission. Patient was on scheduled zofarn, which was discontinued for a prolonged Qtc of 564. By discharge, patient Qtc was 35.  Issues for follow up: 1) Re-assess insulin regimen, stopped long acting insulin on d/c, restart once she is eating 2) Lisinopril held for rise in Cr 3) Check K on patient, patient had hypokalemia while admitted

## 2021-10-17 NOTE — Progress Notes (Signed)
FPTS Brief Progress Note  S: Came to patient's bedside for night rounds.  On arrival patient was laying down in bed.  Patient expressed that her pain is much improved here at the hospital prior to admission but will prefer to go back on Dilaudid IV.  Informed patient I am unable to go back to Dilaudid given that she had initially complained of pain control was very short-lived and requesting increasing frequency.  I also explained to patient that oxycodone will act longer over Dilaudid.    O: BP (!) 151/128 (BP Location: Right Arm)    Pulse (!) 108    Temp 99 F (37.2 C) (Oral)    Resp 17    Ht 5\' 1"  (1.549 m)    Wt 104.3 kg    LMP  (LMP Unknown)    SpO2 98%    BMI 43.45 kg/m    General:Awake, Laying in bed  CV: RRR, no murmurs Pulm: CTAB Abd: Soft, no distension, +ve tenderness Skin: dry, warm Ext: No BLE edema, +2 Pedal and radial pulse.   A/P: -Continue plan per day team - Orders reviewed. Labs for AM ordered, which was adjusted as needed.   Alen Bleacher, MD 10/17/2021, 2:40 AM PGY-1, [facility] Family Medicine Night Resident  Please page [phone] with questions.

## 2021-10-17 NOTE — Progress Notes (Signed)
Pt currently on RA, pt does not want to wear ATC at this time. RT to continue to monitor.

## 2021-10-17 NOTE — Progress Notes (Addendum)
Family Medicine Teaching Service Daily Progress Note Intern Pager: [phone]  Patient name: [name] Medical record number: [id number] Date of birth: June 29, 1971 Age: 51 y.o. Gender: female  Primary Care Provider: [name], MD Consultants: GI Code Status: Full   Pt Overview and Major Events to Date:  10/14/21 - Patient admitted 10/15/21 - EGD  Assessment and Plan:  [name] is a 51 y.o. female presenting with coffee ground emesis and abdominal pain in setting of recurrent nausea and vomiting . PMH is significant for DM2, CKD, HTN, Subglottic stenosis s/p tracheostomy, OSA not on CPAP, obesity, chronic abdominal pain, GERD, HLD, COPD, HFpEF, Depression   Abdominal pain w/ coffee ground emesis Abdominal pain poorly controlled overnight, patient switched to oral oxycodone 10 mg q4h, but continued to request IV dilaudid. Patient pain regimen changed to offer longer period of benefit, which was explained to patient. Patient recievd 2 dose of oxy 10 this am (0015, 0524), and two yesterday (1713, 2030). Patient complaining of abdominal and chest pain this morning, EKG without signs of ischemia this morning with prolonged qtc to 487. GI unsure about source of pain and will perform upper GI today. Team wanting to try Reglan for patient, however patient has contraindication to Reglan as it caused a panic attack in past. Team will reach out to patient about trial of Reglan. If patient wanting to try Reglan, will switch Zofran to prn.   -Upper GI -Consider Reglan, if so, switch Zofran to prn -Continue Protonix -Continue Zofran -Continue Carfate 1 g q6h -Oxycodone 10 mg q4h, sched  -Tylenol 650 mg q4h    VTE prophylaxis -Start Lovenox 50 mg daily (weight based)   Hypertensive Emergency   HTN Patient BP ranged from 150-170's/80-120's overnight. Patient would benefit from BP control. Gaol BP < 140/90. -Increase lisinopril to 40 mg -Continue to  monitor   Hypokalemia Patient K 3.1 today, will replete, patient continues to have episodes of vomitting -60 meq K PO.   AKI   CKD IIIa Patient with GFR of 49 on admission, with Cr 1.31 and BUN 13. Baseline Cr is (1.06 - 1.10). Patient Cr today 1.30, stable from admission.  -continue to monitor   DM2 CBG range 65- 139, more recently stable above 100. Patient tolerating PO (liquids), but continues to complain of emesis.  -Continue to monitor   Borderline prolonged QTC (stable) -Will continue to monitor, check EKG every few days, patient continues to receive scheduled IV Zofran. Last check 1/24, Today QTC was 487. Will get repeat EKG tomorrow if patient started on Reglan and Zofran made prn. -AM EKG    All other conditions chronic and stable GERD HLD COPD HFpEF Depression OSA  FEN/GI: Clears diet PPx: SCDs  Dispo:Home in 2-3 days. Barriers include pain management.   Subjective:  Patient reports pain medicine not working for longer than hour, and Dilaudid worked better. Patient in pain this morning, writhing in pain with momementary pauses of frustration.   Objective: Temp:  [98.3 F (36.8 C)-99 F (37.2 C)] 98.5 F (36.9 C) (01/26 0405) Pulse Rate:  [88-108] 88 (01/26 0405) Resp:  [17-19] 19 (01/26 0405) BP: (95-189)/(78-128) 165/81 (01/26 0405) SpO2:  [69 %-100 %] 97 % (01/26 0405) Physical Exam: General: In acute distress, writhing and crying from pain Cardiovascular: RRR, NRMG Respiratory: CTABL Abdomen: Soft, TTP in epigastric region and extending up to top of sternum, non-distended Extremities: Moving all extremities independently, no edema  Laboratory: Recent Labs  Lab 10/14/21 1050 10/15/21 1124  10/16/21 0204  WBC 11.0* 9.9 10.9*  HGB 12.3 11.2* 11.7*  HCT 38.0 34.5* 36.9  PLT 384 383 384   Recent Labs  Lab 10/12/21 1911 10/13/21 1450 10/14/21 1050 10/15/21 1124 10/15/21 2131 10/16/21 0204 10/16/21 1633  NA 140 135 138   < > 133* 131* 135   K 3.1* 3.1* 4.4   < > 4.8 3.0* 3.6  CL 101 99 102   < > 102 101 101  CO2 21* 22 22   < > 18* 20* 21*  BUN 21* 13 13   < > 9 5* 9  CREATININE 1.28* 1.21* 1.31*   < > 1.11* 1.04* 1.14*  CALCIUM 9.8 9.2 9.3   < > 8.7* 8.8* 9.3  PROT 9.0* 8.4* 8.4*  --   --   --   --   BILITOT 0.5 0.5 2.1*  --   --   --   --   ALKPHOS 101 102 111  --   --   --   --   ALT 23 28 28   --   --   --   --   AST 21 27 46*  --   --   --   --   GLUCOSE 213* 245* 107*   < > 82 65* 95   < > = values in this interval not displayed.      Imaging/Diagnostic Tests:   Holley Bouche, MD 10/17/2021, 6:05 AM PGY-1, [place] Intern pager: [phone], text pages welcome

## 2021-10-17 NOTE — Progress Notes (Signed)
[facility] Progress Note  CC:  Abdominal pain  Subjective:  Says that she feels awful.  Severe pain in epigastrium and up into her chest.  Vomiting as well and nauseated.  Says that they are only giving her oxy for her pain and she is vomiting it up so she does not have any pain control.    Objective:  Vital signs in last 24 hours: Temp:  [98.5 F (36.9 C)-99 F (37.2 C)] 98.5 F (36.9 C) (01/26 0405) Pulse Rate:  [88-108] 102 (01/26 0741) Resp:  [17-19] 18 (01/26 0741) BP: (95-177)/(78-128) 165/81 (01/26 0405) SpO2:  [97 %-100 %] 99 % (01/26 0741) FiO2 (%):  [21 %] 21 % (01/26 0741) Last BM Date: 10/15/21 General:  Alert, Well-developed, on her side curled up in a ball in the bed, tearful. Heart:  Tachy; no murmurs Pulm:  CTAB.  No W/R/R. Abdomen:  Soft, non-distended.  BS present.  Epigastric TTP. Neurologic:  Alert and  oriented x4;  grossly normal neurologically. Psych:  Alert and cooperative. Tearful.  Intake/Output from previous day: 01/25 0701 - 01/26 0700 In: -  Out: 150 [Emesis/NG output:150]  Lab Results: Recent Labs    10/14/21 1050 10/15/21 1124 10/16/21 0204  WBC 11.0* 9.9 10.9*  HGB 12.3 11.2* 11.7*  HCT 38.0 34.5* 36.9  PLT 384 383 384   BMET Recent Labs    10/16/21 0204 10/16/21 1633 10/17/21 0648  NA 131* 135 136  K 3.0* 3.6 3.1*  CL 101 101 101  CO2 20* 21* 23  GLUCOSE 65* 95 151*  BUN 5* 9 10  CREATININE 1.04* 1.14* 1.30*  CALCIUM 8.8* 9.3 9.1   LFT Recent Labs    10/14/21 1050 10/14/21 1720  PROT 8.4*  --   ALBUMIN 4.0  --   AST 46*  --   ALT 28  --   ALKPHOS 111  --   BILITOT 2.1*  --   BILIDIR  --  0.1   PT/INR Recent Labs    10/14/21 1720  LABPROT 13.3  INR 1.0   DG Abd 1 View  Result Date: 10/15/2021 CLINICAL DATA:  Abdominal pain EXAM: ABDOMEN - 1 VIEW COMPARISON:  None. FINDINGS: Normal abdominal gas pattern. No gross free intraperitoneal gas. No organomegaly varicosed ectomy clips noted  within the right upper quadrant. L4-5 lumbar fusion with instrumentation has been performed. No acute bone abnormality. IMPRESSION: Normal abdominal gas pattern. Electronically Signed   By: Fidela Salisbury M.D.   On: 10/15/2021 20:15    Assessment / Plan:  CGE, epigastric pain.  10/15/2021 EGD with subtle congestion in mucosa at pylorus of unclear significance.  This was biopsied.  Bile present in stomach.  Mild duodenal erythema biopsied--path pending.  Symptoms out of proportion to endoscopic findings.  Suspicion for underlying gastroparesis and type I diabetic however gastric emptying study was normal in April 2022.  No chronic or acute GI findings on CT or ultrasound.  Symptoms refractory to Carafate, PPI,'s antispasmodic (Bentyl, hyoscyamine).       Fatty liver.     Anxiety.  This contributes significantly to her experience of pain.   Hyponatremia.    Hypokalemia:  K+ 3.1 today.   IDDM.  -She is already on gabapentin 300 mg BID, but ? If that could be increased or changed to amitriptyline? -I am going to get an UGI to look at the esophagus real time.  ? Esophageal spasm.  Do not do manometry as inpatient.  Although  she reports no improvement with Bentyl or levsin (antispasmodics).  She is agreeable to try to do this and I asked nursing to give her a dose of zofran before going down for the study.    LOS: 2 days   Laban Emperor. Piper Albro  10/17/2021, 9:11 AM

## 2021-10-17 NOTE — Progress Notes (Signed)
Pt currently on RA, pt states she will place ATC back on when she starts to feel dry, but is unable to tolerate it continuously. RT will continue to monitor.

## 2021-10-18 ENCOUNTER — Inpatient Hospital Stay (HOSPITAL_COMMUNITY): Payer: 59

## 2021-10-18 DIAGNOSIS — R933 Abnormal findings on diagnostic imaging of other parts of digestive tract: Secondary | ICD-10-CM | POA: Diagnosis not present

## 2021-10-18 DIAGNOSIS — K2970 Gastritis, unspecified, without bleeding: Principal | ICD-10-CM

## 2021-10-18 DIAGNOSIS — K2990 Gastroduodenitis, unspecified, without bleeding: Secondary | ICD-10-CM

## 2021-10-18 DIAGNOSIS — R1013 Epigastric pain: Secondary | ICD-10-CM | POA: Diagnosis not present

## 2021-10-18 LAB — BASIC METABOLIC PANEL
Anion gap: 10 (ref 5–15)
BUN: 14 mg/dL (ref 6–20)
CO2: 23 mmol/L (ref 22–32)
Calcium: 8.9 mg/dL (ref 8.9–10.3)
Chloride: 103 mmol/L (ref 98–111)
Creatinine, Ser: 2.35 mg/dL — ABNORMAL HIGH (ref 0.44–1.00)
GFR, Estimated: 24 mL/min — ABNORMAL LOW (ref >60)
Glucose, Bld: 103 mg/dL — ABNORMAL HIGH (ref 70–99)
Potassium: 3.3 mmol/L — ABNORMAL LOW (ref 3.5–5.1)
Sodium: 136 mmol/L (ref 135–145)

## 2021-10-18 LAB — GLUCOSE, CAPILLARY
Glucose-Capillary: 124 mg/dL — ABNORMAL HIGH (ref 70–99)
Glucose-Capillary: 144 mg/dL — ABNORMAL HIGH (ref 70–99)
Glucose-Capillary: 75 mg/dL (ref 70–99)
Glucose-Capillary: 75 mg/dL (ref 70–99)
Glucose-Capillary: 96 mg/dL (ref 70–99)

## 2021-10-18 LAB — MAGNESIUM: Magnesium: 1.9 mg/dL (ref 1.7–2.4)

## 2021-10-18 MED ORDER — ONDANSETRON HCL 4 MG/2ML IJ SOLN: 4.0000 mg | Freq: Four times a day (QID) | INTRAMUSCULAR | Status: DC | PRN

## 2021-10-18 MED ORDER — POTASSIUM CHLORIDE 20 MEQ PO PACK
40.0000 meq | PACK | Freq: Once | ORAL | Status: AC
  Administered 2021-10-18: 40 meq via ORAL
  Filled 2021-10-18: qty 2

## 2021-10-18 MED ORDER — GABAPENTIN 300 MG PO CAPS: 300.0000 mg | ORAL_CAPSULE | Freq: Two times a day (BID) | ORAL | Status: DC

## 2021-10-18 MED ORDER — OXYCODONE HCL 5 MG PO TABS
10.0000 mg | ORAL_TABLET | Freq: Four times a day (QID) | ORAL | Status: DC
  Administered 2021-10-18: 10 mg via ORAL
  Filled 2021-10-18 (×2): qty 2

## 2021-10-18 MED ORDER — METOCLOPRAMIDE HCL 5 MG/ML IJ SOLN: 10.0000 mg | Freq: Three times a day (TID) | INTRAMUSCULAR | Status: DC

## 2021-10-18 NOTE — Progress Notes (Signed)
Pt does her own trach care as well as suctioning. Pt provided new inner cannula to change when she's ready, and extra suction catheters. Per pt's request, ATC turned down while she isn't using because of the noise. Pt will let us know when she's ready to wear ATC again so we can increase the flow. RT will continue to monitor.

## 2021-10-18 NOTE — Progress Notes (Signed)
Pt sleeping when RT came by for last rounds. Pt remains on RA, no obvious distress noted.

## 2021-10-18 NOTE — Progress Notes (Signed)
Family Medicine Teaching Service Daily Progress Note Intern Pager: [phone]  Patient name: [name] Medical record number: [id number] Date of birth: [birth year] Age: 51 y.o. Gender: female  Primary Care Provider: [name], MD Consultants: GI Code Status: FUll  Pt Overview and Major Events to Date:  10/14/21 - Patient admitted 10/15/21 - EGD  Assessment and Plan:  [name] is a 51 y.o. female presenting with coffee ground emesis and abdominal pain in setting of recurrent nausea and vomiting . PMH is significant for DM2, CKD, HTN, Subglottic stenosis s/p tracheostomy, OSA not on CPAP, obesity, chronic abdominal pain, GERD, HLD, COPD, HFpEF, Depression   Abdominal pain w/ coffee ground emesis Abdominal pain improved overnight. Patient awaiting Upper GI swallow study. Reglan to consider once Qtc appropriate.   -Upper GI swallow study -Protonix 40 mg BID -Zofran 4 mg prn -Bentyl 20 mg BID -Carfate 1 g q6h -Oxycodone 10 mg q4h -Tylenol 650 q4h   HTN   Hypertensive Emergency BP stable, ranging from 110-140's/60-90's. Continue to monitor -Lisinopril held for Cr concern  Hypokalemia K 3.3 this morning, repleted   AKI   CKD IIIa Cr 2.35 today, up from yesterday 1.30, BUN 14. Cr rise after increasing lisinopril dose, will hold.   DM2 CBG ranging from 75-170, 10 units of short acting insuline yesterday  Prolonged QTC  Qtc prolonged to 564 today. Zofran and Reglan held until appropriate. Repeat EKG with QTC 470 later in the day.   All other conditions chronic and stable GERD HLD COPD HFpEF Depression OSA   FEN/GI: Clears diet PPx: Lovenox Dispo:Home pending clinical improvement .    Subjective:  Patient improved today, smiling and no longer in pain.   Objective: Temp:  [97.4 F (36.3 C)-99.1 F (37.3 C)] 97.4 F (36.3 C) (01/27 0429) Pulse Rate:  [87-114] 87 (01/27 0429) Resp:  [16-18] 16 (01/27 0429) BP:  (115-178)/(61-115) 115/61 (01/27 0429) SpO2:  [99 %-100 %] 100 % (01/27 0429) FiO2 (%):  [21 %-28 %] 28 % (01/26 1614) Physical Exam: General: Well appearing, happy, NAD, African American woman Cardiovascular: RRR, [place] Respiratory: CTABL Abdomen: Soft, non-distended, NTTP Extremities: Moving all extremities independently, no edema  Laboratory: Recent Labs  Lab 10/14/21 1050 10/15/21 1124 10/16/21 0204  WBC 11.0* 9.9 10.9*  HGB 12.3 11.2* 11.7*  HCT 38.0 34.5* 36.9  PLT 384 383 384   Recent Labs  Lab 10/12/21 1911 10/13/21 1450 10/14/21 1050 10/15/21 1124 10/16/21 1633 10/17/21 0648 10/18/21 0141  NA 140 135 138   < > 135 136 136  K 3.1* 3.1* 4.4   < > 3.6 3.1* 3.3*  CL 101 99 102   < > 101 101 103  CO2 21* 22 22   < > 21* 23 23  BUN 21* 13 13   < > 9 10 14   CREATININE 1.28* 1.21* 1.31*   < > 1.14* 1.30* 2.35*  CALCIUM 9.8 9.2 9.3   < > 9.3 9.1 8.9  PROT 9.0* 8.4* 8.4*  --   --   --   --   BILITOT 0.5 0.5 2.1*  --   --   --   --   ALKPHOS 101 102 111  --   --   --   --   ALT 23 28 28   --   --   --   --   AST 21 27 46*  --   --   --   --   GLUCOSE 213* 245* 107*   < >  95 151* 103*   < > = values in this interval not displayed.      Imaging/Diagnostic Tests:   Holley Bouche, MD 10/18/2021, 5:35 AM PGY-1, [place] Intern pager: [phone], text pages welcome

## 2021-10-18 NOTE — Progress Notes (Signed)
Mobility Specialist Criteria Algorithm Info.   10/18/21 1515  Pain Assessment  Pain Assessment 0-10  Pain Score 6  Pain Location Lower back  Pain Descriptors / Indicators Discomfort;Aching  Pain Intervention(s) Limited activity within patient's tolerance  Mobility  Activity Ambulated independently in room  Range of Motion/Exercises Active;All extremities  Level of Assistance Independent  Assistive Device None  Distance Ambulated (ft) 40 ft  Activity Response Tolerated well   Patient received in bed. Declined hallway ambulation but agreed to ambulate in room. Ambulated in room independently with steady gait. Tolerated ambulation well without incident. Complained about 6/10 back pain that is chronic. Was left lying supine in bed with all needs met.   10/18/2021 3:21 PM  [name], [place], [place]  ELFYB:[phone] Office: [phone]

## 2021-10-18 NOTE — Progress Notes (Signed)
FPTS Brief Progress Note  S: Into patient's room for night rounds.  Patient was laying in bed awake on arrival.  She says she is doing much better today and pain better controlled.  She has no complaint at this time.   O: BP (!) 143/100 (BP Location: Right Arm)    Pulse (!) 105    Temp 99.1 F (37.3 C) (Oral)    Resp 18    Ht 5\' 1"  (1.549 m)    Wt 104.3 kg    LMP  (LMP Unknown)    SpO2 100%    BMI 43.45 kg/m   General:Awake, well appearing, NAD CV: RRR, no murmurs, normal S1/S2 Pulm: CTAB, good WOB on RA Abd: Soft, no distension, no tenderness Skin: dry, warm Ext: No BLE edema   A/P: -GI following with plans to obtain swallow study -Continue plan per day team - Orders reviewed. Labs for AM ordered, which was adjusted as needed.    Alen Bleacher, MD 10/18/2021, 3:24 AM PGY-1, [facility] Family Medicine Night Resident  Please page [phone] with questions.

## 2021-10-18 NOTE — Progress Notes (Signed)
° ° ° °  [facility] Progress Note  CC:  Abdominal pain  Subjective:  Feeling better.  Still with some discomfort.  On the phone resting comfortably when I entered the room.  Laughing and joking.  Tolerating liquids and wants to try solid food.  Says that "the blue pill that they started giving me has really helped", but I do not see anything new listed.  She has been getting Bentyl BID but that was started on Monday.  Objective:  Vital signs in last 24 hours: Temp:  [97.4 F (36.3 C)-99.1 F (37.3 C)] 98 F (36.7 C) (01/27 0746) Pulse Rate:  [81-114] 95 (01/27 0808) Resp:  [16-18] 17 (01/27 0808) BP: (115-178)/(61-115) 119/75 (01/27 0746) SpO2:  [99 %-100 %] 100 % (01/27 0808) FiO2 (%):  [28 %] 28 % (01/26 1614) Last BM Date: 10/15/21 General:  Alert, Well-developed, in NAD Heart:  Regular rate and rhythm; no murmurs Pulm:  CTAB.  No W/R/R. Abdomen:  Soft, non-distended.  BS present.  Some epigastric TTP. Extremities:  Without edema. Neurologic:  Alert and oriented x 4;  grossly normal neurologically. Psych:  Alert and cooperative. Normal mood and affect.  Intake/Output from previous day: 01/26 0701 - 01/27 0700 In: 12.1 [I.V.:12.1] Out: -   Lab Results: Recent Labs    10/15/21 1124 10/16/21 0204  WBC 9.9 10.9*  HGB 11.2* 11.7*  HCT 34.5* 36.9  PLT 383 384   BMET Recent Labs    10/16/21 1633 10/17/21 0648 10/18/21 0141  NA 135 136 136  K 3.6 3.1* 3.3*  CL 101 101 103  CO2 21* 23 23  GLUCOSE 95 151* 103*  BUN 9 10 14   CREATININE 1.14* 1.30* 2.35*  CALCIUM 9.3 9.1 8.9   Assessment / Plan:  CGE, epigastric pain.  10/15/2021 EGD with subtle congestion in mucosa at pylorus of unclear significance.  This was biopsied.  Bile present in stomach.  Mild duodenal erythema biopsied--path pending.  Symptoms out of proportion to endoscopic findings.  Suspicion for underlying gastroparesis and type I diabetic however gastric emptying study was normal in April  2022.  No chronic or acute GI findings on CT or ultrasound.  Symptoms refractory to Carafate, PPI,'s antispasmodic (Bentyl, hyoscyamine apparently), but she tells me today "the blue pill that they started giving me has really helped".  I do not see anything new listed--she has been getting Bentyl BID but that was started on Monday.  UGI series IMPRESSION: 1. Limited, single contrast upper GI. 2. Barium tablet arrested in the distal esophagus and did not pass into the stomach despite administration of additional barium. A mild stricture is not excluded. 3. No evidence of a hiatal hernia or gastric outlet obstruction. 4. Mild nonspecific esophageal dysmotility.  EGD 1/24 showed a normal esophagus so I wonder if the barium tablet not passing could be more indicative of the dysmotility, ? Hypertensive LES.  ? Need for esophageal manometry, which would be performed as an outpatient.     Fatty liver.     Anxiety.  This contributes significantly to her experience of pain.   Hyponatremia.    Hypokalemia:  K+ 3.1 today.   IDDM.  -GI signing off.  Consider esophageal manometry as outpatient.  Otherwise continue PPI BID and Bentyl BID for now.    LOS: 3 days   Laban Emperor. Chenae Brager  10/18/2021, 9:22 AM

## 2021-10-18 NOTE — Discharge Summary (Addendum)
[facility] Discharge Summary  Patient name: [name] Medical record number: [id number] Date of birth: 28-Aug-1971 Age: 51 y.o. Gender: female Date of Admission: 10/14/2021  Date of Discharge: 10/18/21 Admitting Physician: Blane Ohara McDiarmid, MD  Primary Care Provider: [name], MD Consultants: GI  Indication for Hospitalization: Coffee ground emesis  Discharge Diagnoses/Problem List:  Abdominal pain w/ coffee ground emesis HTN   Hypertensive Emergency Hypokalemia AKI   CKD IIIa DM2 Prolonged QTC   Disposition: Home  Discharge Condition: Improved  Discharge Exam:  Temp:  [97.4 F (36.3 C)-99.1 F (37.3 C)] 97.4 F (36.3 C) (01/27 0429) Pulse Rate:  [87-114] 87 (01/27 0429) Resp:  [16-18] 16 (01/27 0429) BP: (115-178)/(61-115) 115/61 (01/27 0429) SpO2:  [99 %-100 %] 100 % (01/27 0429) FiO2 (%):  [21 %-28 %] 28 % (01/26 1614) Physical Exam: General: Well appearing, happy, NAD, African American woman Cardiovascular: RRR, [place] Respiratory: CTABL Abdomen: Soft, non-distended, NTTP Extremities: Moving all extremities independently, no edema  Brief Hospital Course:  [name] is a 51 y.o. female presenting with coffee ground emesis and abdominal pain in setting of recurrent nausea and vomiting . PMH is significant for DM2, CKD, HTN, Tracheal stenosis s/p tracheostomy, OSA not on CPAP, obesity, chronic abdominal pain, GERD, HLD, COPD, HFpEF, Depression. Her hospital course is detailed below.  Abdominal pain w/ coffee ground emesis Patient presents to ED for her 5th visit over the past week, with complaints of persistent nausea, vomiting, generalized abdominal pain, and now coffee ground emesis. On arrival, patient was afebrile with a pulse of 103, BP 214/123, with 100% O2 saturation. Patient received IV fluid bolus, protonix, droperidol, zofran, ativan, and dilaudid. Labs were significant for a normal lipase,  Cr of 1.31, AST 46, T. Bili 2.1, GFR 49. WBC slightly elevated to 11.0, and Hgb 12.3. UA was positive for 5 ketones, and 100 protein. On imaging, RUQ u/s showed s/p cholecystectomy and fatty liver disease. CXR showed no active disease. CT angio chest/abd/pelv on 10/12/21 showed no acute intrathoracic, abdominal, or pelvic pathology, nor aortic dissection or aneurysm. CT abd/pelv w contrast on 10/11/21, showed no acute findings within the abdomen or pelvis. EKG was normal sinus rhythm without signs of ischemia. Patient had EGD that showed no concern for GI bleed, with bile present in stomach, mild duodenal erythema, and congested mucosa in the pylorus. Patient also had Upper GI barrium swallow performed that was benign showing no evidence of gastric outlet obstruction, and mild nonspecific esophageal dysmotility. Patient was started on IV dilaudid for pain control and then transitioned to home opiod regimen of Oxycodone 10 mg q4h. Patient abdominal pain was completely resolved by discharge, with no more nausea or vomiting.    Hypertensive Emergency Patient BP on admission was 214/123 and patient was complaining of a headache for the last few hours. Patient denied any vision changes, and had chest pain associated with vomiting. BP was lowered with labetalol, and then patient had home lisinopril restarted at a higher dose of 40 mg daily. Patient pressures improved. By discharge, patient BP were stable ranging from 110-140's/ 60-90's   AKI   CKD IIIa Patient with GFR of 49 on admission, with Cr 1.31 and BUN 13. Baseline Cr is (1.06 - 1.10). Cr rose to 2.35 during admission, in the setting of increasing lisinopril. Lisinopril was held before discharge.    Hypokalemia Patient K hypokalemic during admission, patient was repleted with oral potassium. By discharge patient K was improved  Borderline prolonged QTC (stable) Patient was on scheduled Zofran q4h for nausea and vomiting with QTc of 497 on admission.  Patient EKG was monitored during admission. Patient was on scheduled zofarn, which was discontinued for a prolonged Qtc of 564. By discharge, patient Qtc was 43.  Issues for follow up: 1) Re-assess insulin regimen, stopped long acting insulin on d/c, restart once she is eating 2) Lisinopril held for rise in Cr 3) Check K on patient, patient had hypokalemia while admitted   Significant Procedures:  EGD 1/ 24 Upper GI barium swallow 1/27  Significant Labs and Imaging:  Recent Labs  Lab 10/14/21 1050 10/15/21 1124 10/16/21 0204  WBC 11.0* 9.9 10.9*  HGB 12.3 11.2* 11.7*  HCT 38.0 34.5* 36.9  PLT 384 383 384   Recent Labs  Lab 10/12/21 1911 10/13/21 1450 10/14/21 1050 10/14/21 1720 10/15/21 1124 10/15/21 2131 10/16/21 0204 10/16/21 1633 10/17/21 0648 10/18/21 0141  NA 140 135 138  --    < > 133* 131* 135 136 136  K 3.1* 3.1* 4.4  --    < > 4.8 3.0* 3.6 3.1* 3.3*  CL 101 99 102  --    < > 102 101 101 101 103  CO2 21* 22 22  --    < > 18* 20* 21* 23 23  GLUCOSE 213* 245* 107*  --    < > 82 65* 95 151* 103*  BUN 21* 13 13  --    < > 9 5* 9 10 14   CREATININE 1.28* 1.21* 1.31*  --    < > 1.11* 1.04* 1.14* 1.30* 2.35*  CALCIUM 9.8 9.2 9.3  --    < > 8.7* 8.8* 9.3 9.1 8.9  MG  --   --   --  2.0  --   --   --   --   --  1.9  ALKPHOS 101 102 111  --   --   --   --   --   --   --   AST 21 27 46*  --   --   --   --   --   --   --   ALT 23 28 28   --   --   --   --   --   --   --   ALBUMIN 4.5 3.8 4.0  --   --   --   --   --   --   --    < > = values in this interval not displayed.      Results/Tests Pending at Time of Discharge:  None  Discharge Medications:  Allergies as of 10/18/2021       Reactions   Reglan [metoclopramide] Other (See Comments)   Panic attack        Medication List     STOP taking these medications    Tresiba FlexTouch 200 UNIT/ML FlexTouch Pen Generic drug: insulin degludec       TAKE these medications    albuterol 108 (90 Base)  MCG/ACT inhaler Commonly known as: ProAir HFA Inhale 2 puffs into the lungs every 6 (six) hours as needed for wheezing or shortness of breath.   albuterol (2.5 MG/3ML) 0.083% nebulizer solution Commonly known as: PROVENTIL Take 3 mLs (2.5 mg total) by nebulization every 6 (six) hours as needed for wheezing or shortness of breath.   aspirin 81 MG EC tablet Take 1 tablet (81 mg total) by mouth daily. Swallow whole.  atorvastatin 20 MG tablet Commonly known as: LIPITOR Take 1 tablet (20 mg total) by mouth daily.   buPROPion 150 MG 12 hr tablet Commonly known as: Wellbutrin SR Take 1 tablet (150 mg total) by mouth 2 (two) times daily.   cetirizine 10 MG tablet Commonly known as: ZYRTEC Take 1 tablet (10 mg total) by mouth at bedtime.   dapagliflozin propanediol 5 MG Tabs tablet Commonly known as: Farxiga Take 1 tablet (5 mg total) by mouth daily before breakfast.   diclofenac Sodium 1 % Gel Commonly known as: Voltaren Apply 4 g topically 4 (four) times daily.   dicyclomine 20 MG tablet Commonly known as: BENTYL Take 1 tablet (20 mg total) by mouth 2 (two) times daily.   docusate sodium 100 MG capsule Commonly known as: COLACE Take 1 capsule (100 mg total) by mouth 2 (two) times daily.   Dulera 200-5 MCG/ACT Aero Generic drug: mometasone-formoterol Inhale 2 puffs into the lungs in the morning and at bedtime.   furosemide 20 MG tablet Commonly known as: LASIX Take 1 tablet (20 mg total) by mouth daily. What changed:  when to take this reasons to take this   gabapentin 300 MG capsule Commonly known as: NEURONTIN Take 1 capsule (300 mg total) by mouth 2 (two) times daily. What changed: how much to take   hydrOXYzine 25 MG tablet Commonly known as: ATARAX Take 1 tablet (25 mg total) by mouth 3 (three) times daily as needed for anxiety.   Insulin Pen Needle 31G X 8 MM Misc Inject 1 Units as directed daily.   lisinopril 20 MG tablet Commonly known as:  ZESTRIL Take 1 tablet (20 mg total) by mouth daily.   melatonin 3 MG Tabs tablet Take 3 mg by mouth at bedtime.   methocarbamol 750 MG tablet Commonly known as: ROBAXIN Take 1 tablet (750 mg total) by mouth every 8 (eight) hours as needed for muscle spasms.   Misc. Devices Misc Nebulizer device. Diagnosis - Asthma   Misc. Devices [employer].  Diagnosis-low back pain   Misc. Mining engineer. Devices Misc Bedside commode. Diagnosis- low back pain   ondansetron 4 MG disintegrating tablet Commonly known as: ZOFRAN-ODT 62m ODT q4 hours prn nausea/vomit   ondansetron 4 MG tablet Commonly known as: ZOFRAN TAKE 1 TABLET BY MOUTH EVERY 8 HOURS AS NEEDED FOR NAUSEA OR VOMITING What changed: reasons to take this   OneTouch Delica Lancets 316XMisc 1 each by Does not apply route daily. Use to monitor glucose levels daily; E11.9   OneTouch Verio test strip Generic drug: glucose blood Use to monitor glucose levels daily; E11.9   OneTouch Verio w/Device Kit 1 each by Does not apply route daily. Use to monitor glucose levels daily; E11.9   Ozempic (0.25 or 0.5 MG/DOSE) 2 MG/1.5ML Sopn Generic drug: Semaglutide(0.25 or 0.5MG/DOS) Inject 0.5 mg into the skin once a week.   pantoprazole 40 MG tablet Commonly known as: PROTONIX Take 1 tablet (40 mg total) by mouth 2 (two) times daily. What changed:  when to take this reasons to take this   pantoprazole 20 MG tablet Commonly known as: PROTONIX Take 1 tablet (20 mg total) by mouth daily. What changed: Another medication with the same name was changed. Make sure you understand how and when to take each.   polyethylene glycol 17 g packet Commonly known as: MIRALAX / GLYCOLAX Take 17 g by mouth daily as needed for mild constipation.   sertraline 100  MG tablet Commonly known as: ZOLOFT Take 1 tablet (100 mg total) by mouth at bedtime.   silver sulfADIAZINE 1 % cream Commonly known as: Silvadene Apply 1  application topically daily.   sucralfate 1 g tablet Commonly known as: Carafate Take 1 tablet (1 g total) by mouth 4 (four) times daily -  with meals and at bedtime.   SUMAtriptan 100 MG tablet Commonly known as: IMITREX At the onset of a migraine; may repeat in 2 hours. Max daily dose 262m What changed:  how much to take how to take this when to take this reasons to take this additional instructions   topiramate 100 MG tablet Commonly known as: Topamax Take 1.5 tablets (150 mg total) by mouth 2 (two) times daily.        Discharge Instructions: Please refer to Patient Instructions section of EMR for full details.  Patient was counseled important signs and symptoms that should prompt return to medical care, changes in medications, dietary instructions, activity restrictions, and follow up appointments.   Follow-Up Appointments:  Follow-up Information     NCharlott Rakes MD Follow up.   Specialty: Family Medicine Contact information: [phone]                 SHolley Bouche MD 10/18/2021, 8:27 PM PGY-1, [facility]

## 2021-10-18 NOTE — Discharge Instructions (Addendum)
Dear [name],   Thank you so much for allowing [place] to be part of your care!  You were admitted to [facility] for abdominal pain and rule out of GI bleed. We are glad you are feeling better! Please be sure to follow up with GI outpatient at the times listed below.   Continue your Protonix and Bentyl twice a day. Avoid NSAIDs and alcohol. We stopped your long acting insulin while in the hospital but you can continue it once your diet normalizes. I recommend following up with your PCP next week    POST-HOSPITAL & CARE INSTRUCTIONS Please let PCP/Specialists know of any changes that were made.  Please see medications section of this packet for any medication changes.   DOCTOR'S APPOINTMENT & FOLLOW UP CARE INSTRUCTIONS  Future Appointments  Date Time Provider [place]  11/04/2021  1:45 PM Jessy Oto, MD [facility] None  11/08/2021  3:00 PM Willia Craze, NP [facility]  11/28/2021  9:45 AM Renato Shin, MD [facility] None    Take care and be well!  [facility]  [place], [place] [address] [phone]

## 2021-10-18 NOTE — Care Management Important Message (Signed)
Important Message  Patient Details  Name: [name] MRN: [id number] Date of Birth: 03-12-1971   Medicare Important Message Given:  Yes     Hannah Beat 10/18/2021, 12:39 PM

## 2021-10-18 NOTE — Progress Notes (Signed)
Ekg performed with critical result. Performed x2 to verify results. Qtc 564. Spoke with provider and pt is no longer able to have nausea medication such as reglan or zofran.

## 2021-10-21 ENCOUNTER — Telehealth: Payer: Self-pay

## 2021-10-21 NOTE — Telephone Encounter (Signed)
Transition Care Management Follow-up Telephone Call Date of discharge and from where: 10/18/2021, [facility] How have you been since you were released from the hospital? She said she is feeling a lot better.  Any questions or concerns? Yes - she was very upset how she was treated when in the hospital.  She stated that the providers did not explain to her everything that was going on with her health, they just wanted to send her out.  She stated that the providers mentioned she was withdrawing from oxycodone however, she explained that she takes oxycodone at home and they were giving to it her in the hospital and she was not having withdrawals. She said she has contacted Patient Experience at the hospital to report her concerns and left a message requesting a call back. Instructed her to call this CM back if she does not hear from Patient Experience and would like assistance trying to contact them.   Items Reviewed: Did the pt receive and understand the discharge instructions provided? Yes  Medications obtained and verified? Yes  - she said she is taking whatever she was given in the hospital.  There are 2 orders for pantoprazole on the AVS. She said that she takes 20 mg daily. The hospital provider's discharge summary notes that lisinopril, farxiga and lasix were to be held and patient was aware of those instructions however the patient said she knew nothing about holding any medication and is taking whatever they were giving her in the hospital.   Other? No  Any new allergies since your discharge? No  Dietary orders reviewed? Yes Do you have support at home? Yes   Home Care and Equipment/Supplies: Were home health services ordered? no If so, what is the name of the agency? N/a  Has the agency set up a time to come to the patient's home? not applicable Were any new equipment or medical supplies ordered?  No What is the name of the medical supply agency? N/a Were you able to get the  supplies/equipment? not applicable Do you have any questions related to the use of the equipment or supplies? No  She has trach and suction supplies as well as a nebulizer  She is requesting a new glucometer. She also has a shower chair and bedside commode.  She is in need of a new cane  Functional Questionnaire: (I = Independent and D = Dependent) ADLs: independent.  Her husband provides assistance when needed. She also receives PCS.    Follow up appointments reviewed:  PCP Hospital f/u appt confirmed? Yes  Scheduled to see Dr Margarita Rana  on 11/14/2021.  She only wants to see Dr Margarita Rana and requested an appointment on a Monday  or Thursday.  [facility] f/u appt confirmed? Yes  Scheduled to see orthopedics- 11/04/2021; GI- 11/08/2021; Endocrinology - 11/28/2021.  . Are transportation arrangements needed? No  If their condition worsens, is the pt aware to call PCP or go to the Emergency Dept.? Yes Was the patient provided with contact information for the PCP's office or ED? Yes Was to pt encouraged to call back with questions or concerns? Yes

## 2021-10-21 NOTE — Telephone Encounter (Signed)
From the discharge call:   She said she is feeling a lot better.   she was very upset how she was treated when in the hospital.  She stated that the providers did not explain to her everything that was going on with her health, they just wanted to send her out.  She stated that the providers mentioned she was withdrawing from oxycodone however, she explained that she takes oxycodone at home and they were giving to it her in the hospital and she was not having withdrawals. She said she has contacted Patient Experience at the hospital to report her concerns and left a message requesting a call back. Instructed her to call this CM back if she does not hear from Patient Experience and would like assistance trying to contact them.   she said she is taking whatever she was given in the hospital.  There are 2 orders for pantoprazole on the AVS. She said that she takes 20 mg daily. The hospital provider's discharge summary notes that lisinopril, farxiga and lasix were to be held at discharge and patient was aware of those instructions however the patient said she knew nothing about holding any medication and is taking whatever they were giving her in the hospital.    She is requesting  a new glucometer and a cane.   Scheduled to see Dr Margarita Rana  on 11/14/2021.  She only wants to see Dr Margarita Rana and requested an appointment on a Monday  or Thursday.

## 2021-10-22 ENCOUNTER — Other Ambulatory Visit: Payer: Self-pay | Admitting: Family Medicine

## 2021-10-22 ENCOUNTER — Other Ambulatory Visit: Payer: Self-pay | Admitting: Endocrinology

## 2021-10-22 DIAGNOSIS — E109 Type 1 diabetes mellitus without complications: Secondary | ICD-10-CM

## 2021-10-22 DIAGNOSIS — I10 Essential (primary) hypertension: Secondary | ICD-10-CM

## 2021-10-22 MED ORDER — ONDANSETRON HCL 4 MG PO TABS: 4.0000 mg | ORAL_TABLET | Freq: Three times a day (TID) | ORAL | 0 refills | Status: DC | PRN

## 2021-10-22 MED ORDER — MISC. DEVICES MISC: 0 refills | Status: DC

## 2021-10-22 MED ORDER — ONETOUCH VERIO W/DEVICE KIT: 1.0000 | PACK | Freq: Every day | 0 refills | Status: DC

## 2021-10-22 NOTE — Telephone Encounter (Signed)
It looks like a prescription for One Touch Verio Meter was sent to the pharmacy on 10/04/2021 by her endocrinologist.  Have done a prescription for cane.

## 2021-10-22 NOTE — Telephone Encounter (Signed)
Call placed to patient and informed her that an order for a glucometer was sent to [facility] 10/04/2021. She said she has to go there later and will check with the pharmacy there at that time.  Informed her that Dr Margarita Rana would like her to have labwork done within a week. She can come to [facility] at anytime to have the blood work done.  She said she will try to be at the lab on Thursday afternoon - 10/24/2021.  She also confirmed that she is aware of the clinic's new address.

## 2021-10-28 ENCOUNTER — Telehealth: Payer: Self-pay | Admitting: Family Medicine

## 2021-10-28 ENCOUNTER — Ambulatory Visit: Payer: 59 | Attending: Family Medicine

## 2021-10-28 ENCOUNTER — Ambulatory Visit: Payer: Self-pay | Admitting: *Deleted

## 2021-10-28 DIAGNOSIS — I10 Essential (primary) hypertension: Secondary | ICD-10-CM

## 2021-10-28 NOTE — Telephone Encounter (Signed)
Home Health Verbal Orders - Caller/Agency: [facility] Number:  [phone] Requesting OT/PT/Skilled Nursing/Social Work/Speech Therapy: PT  Frequency:   Hands and legs are swollen BP elevated -transferred to Nurse triage

## 2021-10-28 NOTE — Telephone Encounter (Signed)
Verbal orders has been given for PT and nursing.

## 2021-10-28 NOTE — Telephone Encounter (Signed)
FYI

## 2021-10-28 NOTE — Telephone Encounter (Signed)
°  Chief Complaint: elevated BP, legs swelling per [facility] therapy inhabit HH.  Symptoms: headache, dizziness when standing, bilateral legs swelling, pain walking, c/o chest pain last night. And hands swelling Saturday  Frequency: today  Pertinent Negatives: Patient denies chest pain , difficulty breathing , no dizziness now .  Disposition: [] ED /[x] Urgent Care (no appt availability in office) / [] Appointment(In office/virtual)/ []  [facility]/ [] Home Care/ [] Refused Recommended Disposition /[] [facility]/ []  Follow-up with PCP Additional Notes:   Recommended P[facility] walk in clinic./ ED for worsening symptoms. BP 150/90 then 142/80. Has not been taking lasix , ran out last week. Please advise order is prn for lasix.       Reason for Disposition  [1] Taking BP medications AND [2] feels is having side effects (e.g., impotence, cough, dizzy upon standing)  Answer Assessment - Initial Assessment Questions 1. BLOOD PRESSURE: "What is the blood pressure?" "Did you take at least two measurements 5 minutes apart?"     BP 150/90  and rechecked for BP 142/80 2. ONSET: "When did you take your blood pressure?"     Now  3. HOW: "How did you obtain the blood pressure?" (e.g., visiting nurse, automatic home BP monitor)     manual BP monitor 4. HISTORY: "Do you have a history of high blood pressure?"     Yes  5. MEDICATIONS: "Are you taking any medications for blood pressure?" "Have you missed any doses recently?"     Yes not missed any  6. OTHER SYMPTOMS: "Do you have any symptoms?" (e.g., headache, chest pain, blurred vision, difficulty breathing, weakness)     Headache  now , no dizziness now yesterday and chest pain yesterday  7. PREGNANCY: "Is there any chance you are pregnant?" "When was your last menstrual period?"     na  Protocols used: Blood Pressure - High-A-AH

## 2021-10-29 ENCOUNTER — Other Ambulatory Visit: Payer: Self-pay

## 2021-10-29 ENCOUNTER — Emergency Department (HOSPITAL_COMMUNITY)
Admission: EM | Admit: 2021-10-29 | Discharge: 2021-10-30 | Disposition: A | Discharge status: Home / Self Care | Payer: 59 | Attending: Emergency Medicine | Admitting: Emergency Medicine

## 2021-10-29 ENCOUNTER — Emergency Department (HOSPITAL_COMMUNITY): Payer: 59

## 2021-10-29 DIAGNOSIS — Z7984 Long term (current) use of oral hypoglycemic drugs: Secondary | ICD-10-CM | POA: Insufficient documentation

## 2021-10-29 DIAGNOSIS — R1013 Epigastric pain: Secondary | ICD-10-CM | POA: Insufficient documentation

## 2021-10-29 DIAGNOSIS — Z7982 Long term (current) use of aspirin: Secondary | ICD-10-CM | POA: Insufficient documentation

## 2021-10-29 DIAGNOSIS — Z79899 Other long term (current) drug therapy: Secondary | ICD-10-CM | POA: Diagnosis not present

## 2021-10-29 DIAGNOSIS — Z794 Long term (current) use of insulin: Secondary | ICD-10-CM | POA: Diagnosis not present

## 2021-10-29 DIAGNOSIS — E119 Type 2 diabetes mellitus without complications: Secondary | ICD-10-CM | POA: Diagnosis not present

## 2021-10-29 DIAGNOSIS — R112 Nausea with vomiting, unspecified: Secondary | ICD-10-CM | POA: Diagnosis not present

## 2021-10-29 DIAGNOSIS — Z7951 Long term (current) use of inhaled steroids: Secondary | ICD-10-CM | POA: Diagnosis not present

## 2021-10-29 DIAGNOSIS — I10 Essential (primary) hypertension: Secondary | ICD-10-CM | POA: Diagnosis not present

## 2021-10-29 DIAGNOSIS — J449 Chronic obstructive pulmonary disease, unspecified: Secondary | ICD-10-CM | POA: Insufficient documentation

## 2021-10-29 DIAGNOSIS — Z5321 Procedure and treatment not carried out due to patient leaving prior to being seen by health care provider: Secondary | ICD-10-CM | POA: Insufficient documentation

## 2021-10-29 DIAGNOSIS — E876 Hypokalemia: Secondary | ICD-10-CM | POA: Insufficient documentation

## 2021-10-29 DIAGNOSIS — R079 Chest pain, unspecified: Secondary | ICD-10-CM | POA: Insufficient documentation

## 2021-10-29 LAB — COMPREHENSIVE METABOLIC PANEL
ALT: 18 U/L (ref 0–44)
AST: 17 U/L (ref 15–41)
Albumin: 3.8 g/dL (ref 3.5–5.0)
Alkaline Phosphatase: 92 U/L (ref 38–126)
Anion gap: 12 (ref 5–15)
BUN: 8 mg/dL (ref 6–20)
CO2: 22 mmol/L (ref 22–32)
Calcium: 9.2 mg/dL (ref 8.9–10.3)
Chloride: 107 mmol/L (ref 98–111)
Creatinine, Ser: 1.08 mg/dL — ABNORMAL HIGH (ref 0.44–1.00)
GFR, Estimated: >60 mL/min (ref >60)
Glucose, Bld: 167 mg/dL — ABNORMAL HIGH (ref 70–99)
Potassium: 3.3 mmol/L — ABNORMAL LOW (ref 3.5–5.1)
Sodium: 141 mmol/L (ref 135–145)
Total Bilirubin: 0.4 mg/dL (ref 0.3–1.2)
Total Protein: 7.8 g/dL (ref 6.5–8.1)

## 2021-10-29 LAB — BASIC METABOLIC PANEL
BUN/Creatinine Ratio: 9 (ref 9–23)
BUN: 9 mg/dL (ref 6–24)
CO2: 22 mmol/L (ref 20–29)
Calcium: 9.2 mg/dL (ref 8.7–10.2)
Chloride: 105 mmol/L (ref 96–106)
Creatinine, Ser: 1.05 mg/dL — ABNORMAL HIGH (ref 0.57–1.00)
Glucose: 113 mg/dL — ABNORMAL HIGH (ref 70–99)
Potassium: 3.8 mmol/L (ref 3.5–5.2)
Sodium: 140 mmol/L (ref 134–144)
eGFR: 64 mL/min/{1.73_m2} (ref >59)

## 2021-10-29 LAB — CBC WITH DIFFERENTIAL/PLATELET
Abs Immature Granulocytes: 0.02 10*3/uL (ref 0.00–0.07)
Basophils Absolute: 0 10*3/uL (ref 0.0–0.1)
Basophils Relative: 0 %
Eosinophils Absolute: 0 10*3/uL (ref 0.0–0.5)
Eosinophils Relative: 0 %
HCT: 37.3 % (ref 36.0–46.0)
Hemoglobin: 11.8 g/dL — ABNORMAL LOW (ref 12.0–15.0)
Immature Granulocytes: 0 %
Lymphocytes Relative: 31 %
Lymphs Abs: 3.2 10*3/uL (ref 0.7–4.0)
MCH: 26.8 pg (ref 26.0–34.0)
MCHC: 31.6 g/dL (ref 30.0–36.0)
MCV: 84.6 fL (ref 80.0–100.0)
Monocytes Absolute: 0.4 10*3/uL (ref 0.1–1.0)
Monocytes Relative: 4 %
Neutro Abs: 6.6 10*3/uL (ref 1.7–7.7)
Neutrophils Relative %: 65 %
Platelets: 450 10*3/uL — ABNORMAL HIGH (ref 150–400)
RBC: 4.41 MIL/uL (ref 3.87–5.11)
RDW: 15.3 % (ref 11.5–15.5)
WBC: 10.2 10*3/uL (ref 4.0–10.5)
nRBC: 0 % (ref 0.0–0.2)

## 2021-10-29 LAB — LIPASE, BLOOD: Lipase: 32 U/L (ref 11–51)

## 2021-10-29 LAB — TROPONIN I (HIGH SENSITIVITY): Troponin I (High Sensitivity): 4 ng/L (ref <18)

## 2021-10-29 MED ORDER — HYDROCODONE-ACETAMINOPHEN 5-325 MG PO TABS
1.0000 | ORAL_TABLET | Freq: Once | ORAL | Status: AC
  Administered 2021-10-29: 1 via ORAL
  Filled 2021-10-29: qty 1

## 2021-10-29 MED ORDER — FUROSEMIDE 20 MG PO TABS: 20.0000 mg | ORAL_TABLET | Freq: Every day | ORAL | 2 refills | Status: DC

## 2021-10-29 MED ORDER — MORPHINE SULFATE (PF) 2 MG/ML IV SOLN
4.0000 mg | Freq: Once | INTRAVENOUS | Status: AC
  Administered 2021-10-29: 4 mg via INTRAVENOUS
  Filled 2021-10-29: qty 2

## 2021-10-29 MED ORDER — SODIUM CHLORIDE 0.9 % IV SOLN
6.2500 mg | Freq: Four times a day (QID) | INTRAVENOUS | Status: DC | PRN
  Filled 2021-10-29: qty 0.25

## 2021-10-29 MED ORDER — ONDANSETRON HCL 4 MG/2ML IJ SOLN
4.0000 mg | Freq: Once | INTRAMUSCULAR | Status: AC
Start: 2021-10-29 — End: 2021-10-29
  Administered 2021-10-29: 4 mg via INTRAVENOUS
  Filled 2021-10-29: qty 2

## 2021-10-29 NOTE — ED Triage Notes (Signed)
Pt brought in by EMS for midsternal chest pain, nausea and vomiting. Pt has aspirin, nitro, zofran via EMS.

## 2021-10-29 NOTE — Telephone Encounter (Signed)
I have sent order for Lasix to the pharmacy and also sent her a separate MyChart message.

## 2021-10-29 NOTE — ED Provider Triage Note (Addendum)
Emergency Medicine Provider Triage Evaluation Note  [name] , a 51 y.o. female  was evaluated in triage.  Pt complains of epigastric pain and chest pain.  Reports that symptoms started approximately 4 hours prior and has been constant since then.  Pain is progressively worsened over time.  Pain is located to epigastric area and radiates to inferior aspect of sternum.  Patient endorses associated nausea and vomiting.  States that emesis is stomach contents and bilious.  Denies any hematemesis or coffee-ground emesis.  Review of Systems  Positive: Epigastric abdominal pain, nausea, vomiting, Negative: Dysuria, hematuria, urinary urgency, constipation, diarrhea, blood in stool, melena, fever, chills, numbness of breath  Physical Exam  BP (!) 176/116 (BP Location: Right Arm)    Pulse (!) 105    Temp 98.9 F (37.2 C) (Oral)    Resp (!) 28    Ht 5\' 1"  (1.549 m)    Wt 104.3 kg    LMP  (LMP Unknown)    SpO2 100%    BMI 43.45 kg/m  Gen:   Awake, no distress   Resp:  Normal effort, clear to auscultation bilaterally MSK:   Moves extremities without difficulty  Other:  Abdomen soft, nondistended, tenderness to epigastric area.  Guarding present.  No rebound tenderness.  Medical Decision Making  Medically screening exam initiated at 8:59 PM.  Appropriate orders placed.  [name] was informed that the remainder of the evaluation will be completed by another provider, this initial triage assessment does not replace that evaluation, and the importance of remaining in the ED until their evaluation is complete.     Loni Beckwith, PA-C 10/29/21 2101    Loni Beckwith, PA-C 10/29/21 2121

## 2021-10-30 ENCOUNTER — Encounter (HOSPITAL_BASED_OUTPATIENT_CLINIC_OR_DEPARTMENT_OTHER): Payer: Self-pay

## 2021-10-30 ENCOUNTER — Emergency Department (HOSPITAL_BASED_OUTPATIENT_CLINIC_OR_DEPARTMENT_OTHER)
Admission: EM | Admit: 2021-10-30 | Discharge: 2021-10-30 | Disposition: A | Discharge status: Home / Self Care | Payer: 59 | Source: Home / Self Care | Attending: Emergency Medicine | Admitting: Emergency Medicine

## 2021-10-30 ENCOUNTER — Other Ambulatory Visit: Payer: Self-pay

## 2021-10-30 DIAGNOSIS — Z7951 Long term (current) use of inhaled steroids: Secondary | ICD-10-CM | POA: Insufficient documentation

## 2021-10-30 DIAGNOSIS — J449 Chronic obstructive pulmonary disease, unspecified: Secondary | ICD-10-CM | POA: Insufficient documentation

## 2021-10-30 DIAGNOSIS — Z7982 Long term (current) use of aspirin: Secondary | ICD-10-CM | POA: Insufficient documentation

## 2021-10-30 DIAGNOSIS — Z794 Long term (current) use of insulin: Secondary | ICD-10-CM | POA: Insufficient documentation

## 2021-10-30 DIAGNOSIS — Z7984 Long term (current) use of oral hypoglycemic drugs: Secondary | ICD-10-CM | POA: Insufficient documentation

## 2021-10-30 DIAGNOSIS — Z79899 Other long term (current) drug therapy: Secondary | ICD-10-CM | POA: Insufficient documentation

## 2021-10-30 DIAGNOSIS — E876 Hypokalemia: Secondary | ICD-10-CM | POA: Insufficient documentation

## 2021-10-30 DIAGNOSIS — E119 Type 2 diabetes mellitus without complications: Secondary | ICD-10-CM | POA: Insufficient documentation

## 2021-10-30 DIAGNOSIS — I10 Essential (primary) hypertension: Secondary | ICD-10-CM | POA: Insufficient documentation

## 2021-10-30 DIAGNOSIS — R112 Nausea with vomiting, unspecified: Secondary | ICD-10-CM | POA: Insufficient documentation

## 2021-10-30 DIAGNOSIS — R1013 Epigastric pain: Secondary | ICD-10-CM | POA: Insufficient documentation

## 2021-10-30 LAB — CBC WITH DIFFERENTIAL/PLATELET
Abs Immature Granulocytes: 0.03 10*3/uL (ref 0.00–0.07)
Basophils Absolute: 0 10*3/uL (ref 0.0–0.1)
Basophils Relative: 0 %
Eosinophils Absolute: 0 10*3/uL (ref 0.0–0.5)
Eosinophils Relative: 0 %
HCT: 34.7 % — ABNORMAL LOW (ref 36.0–46.0)
Hemoglobin: 11.1 g/dL — ABNORMAL LOW (ref 12.0–15.0)
Immature Granulocytes: 0 %
Lymphocytes Relative: 20 %
Lymphs Abs: 1.8 10*3/uL (ref 0.7–4.0)
MCH: 26.6 pg (ref 26.0–34.0)
MCHC: 32 g/dL (ref 30.0–36.0)
MCV: 83.2 fL (ref 80.0–100.0)
Monocytes Absolute: 0.3 10*3/uL (ref 0.1–1.0)
Monocytes Relative: 3 %
Neutro Abs: 7 10*3/uL (ref 1.7–7.7)
Neutrophils Relative %: 77 %
Platelets: 407 10*3/uL — ABNORMAL HIGH (ref 150–400)
RBC: 4.17 MIL/uL (ref 3.87–5.11)
RDW: 15.4 % (ref 11.5–15.5)
WBC: 9.2 10*3/uL (ref 4.0–10.5)
nRBC: 0 % (ref 0.0–0.2)

## 2021-10-30 LAB — TROPONIN I (HIGH SENSITIVITY): Troponin I (High Sensitivity): 5 ng/L (ref <18)

## 2021-10-30 IMAGING — CT CT HEAD W/O CM
4 series · 15 of 47 positions shown, 17 images · non-contrast
Comparison: 08/21/2016, 12/09/2020

CLINICAL DATA: Fall, head trauma

EXAM:
CT HEAD WITHOUT CONTRAST
CT CERVICAL SPINE WITHOUT CONTRAST
TECHNIQUE: Multidetector CT imaging of the head and cervical spine was
performed following the standard protocol without intravenous
contrast. Multiplanar CT image reconstructions of the cervical spine
were also generated.

[Series 3: head without · axial · non-contrast · 0.42mm/px · z∈[+1383,+1503]mm · 7 of 33 slices shown, 9 images]
[im 5/33  brain]
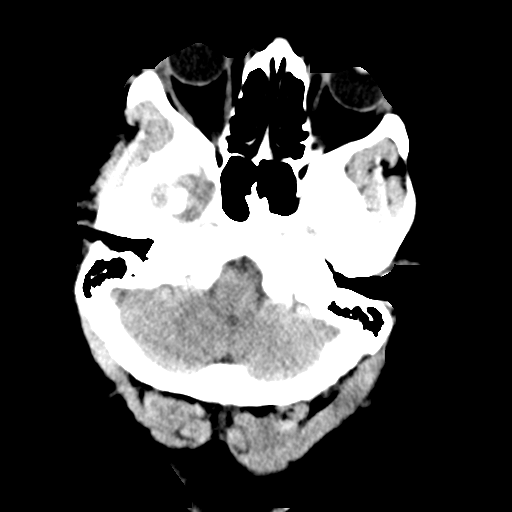
[im 5/33  bone]
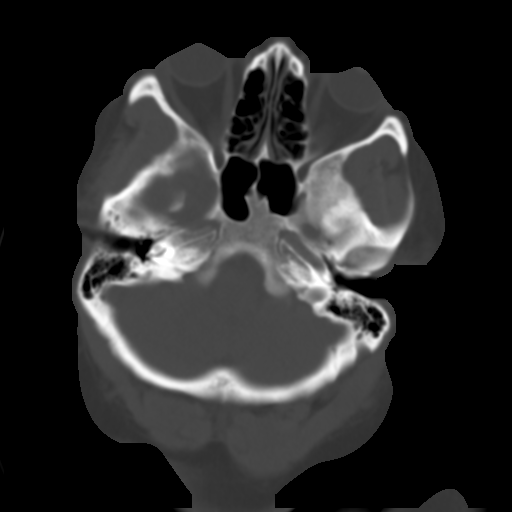
[im 9/33  brain]
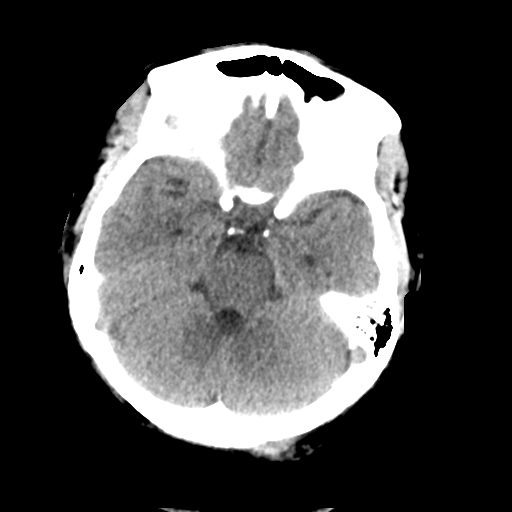
[im 13/33  brain]
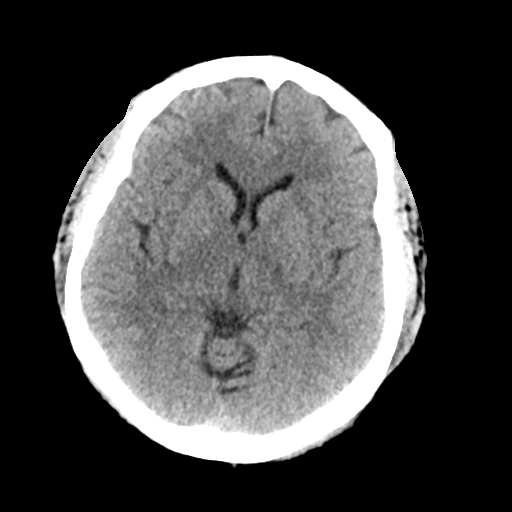
[im 17/33  brain]
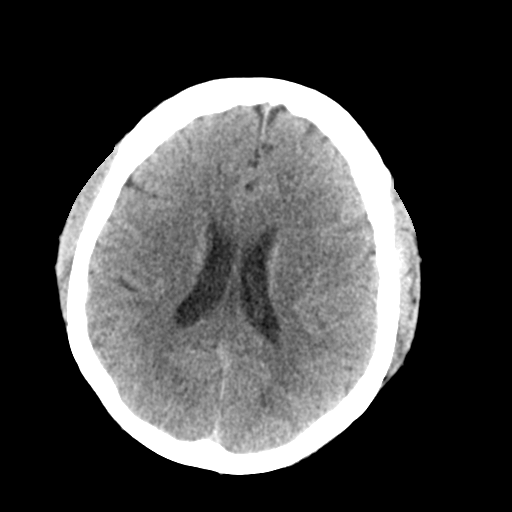
[im 21/33  brain]
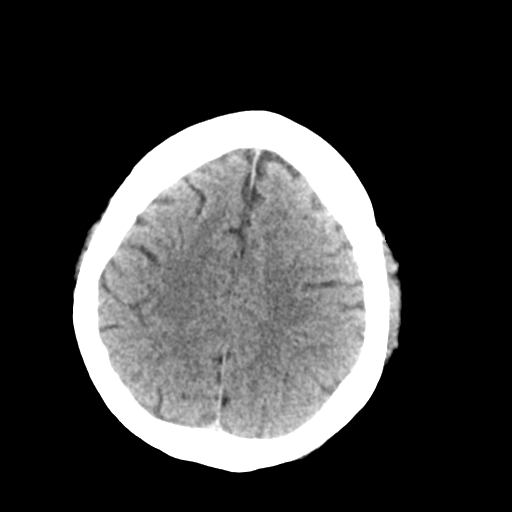
[im 21/33  bone]
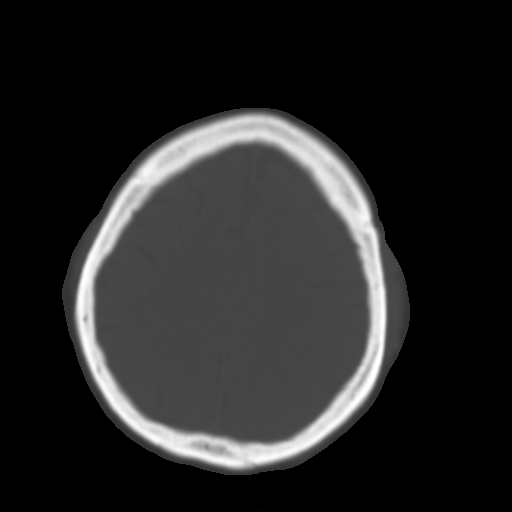
[im 25/33  brain]
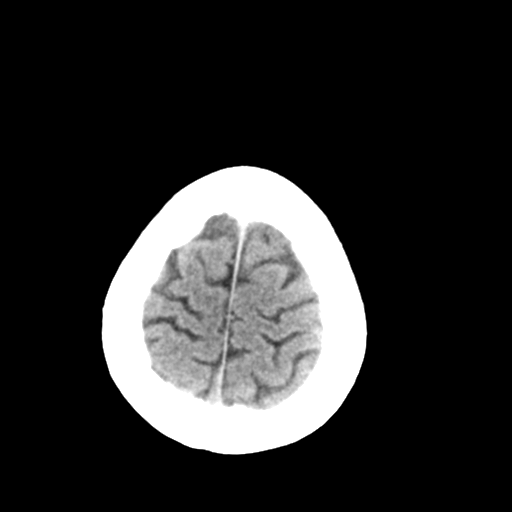
[im 29/33  brain]
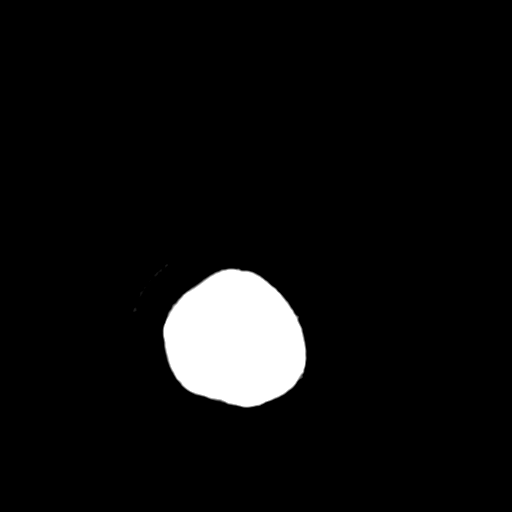

[Series 4: ax head bone · axial · 0.42mm/px · z∈[+1395,+1410]mm · 2 of 81 slices shown]
[im 9/81  bone]
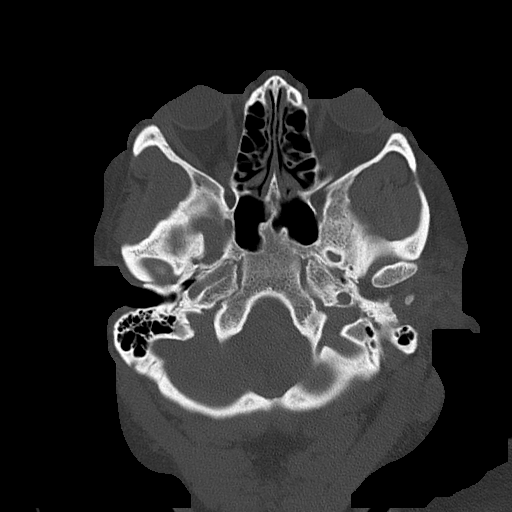
[im 17/81  bone]
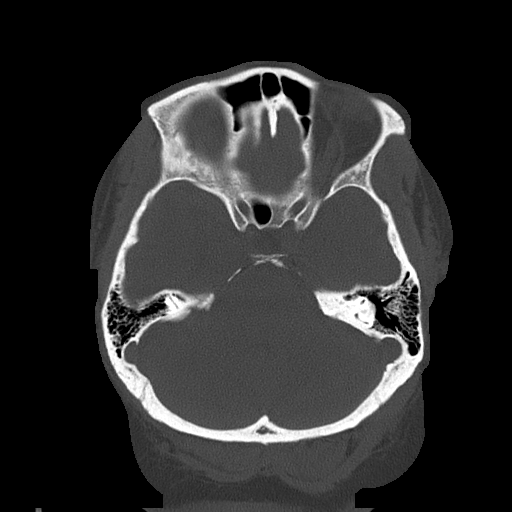

[Series 5: head without cor · coronal · non-contrast · 0.29mm/px · 3 of 62 slices shown]
[im 21/62  brain]
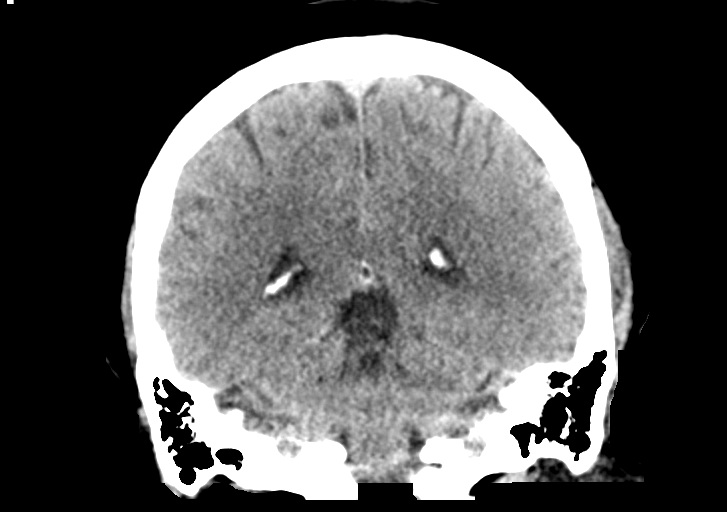
[im 28/62  brain]
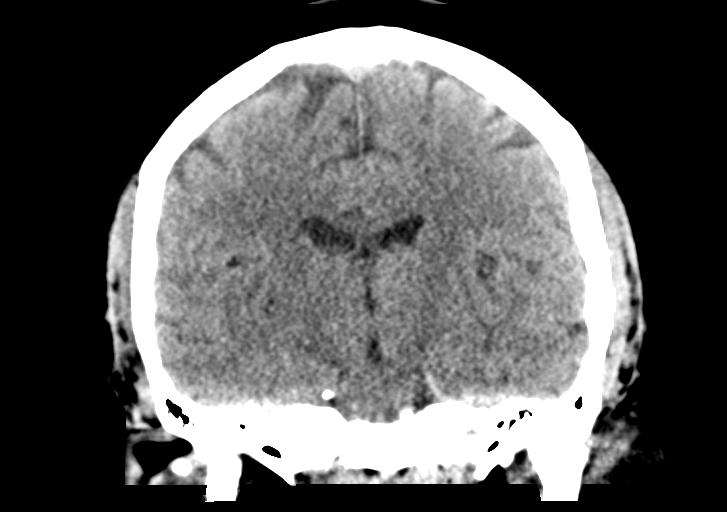
[im 34/62  brain]
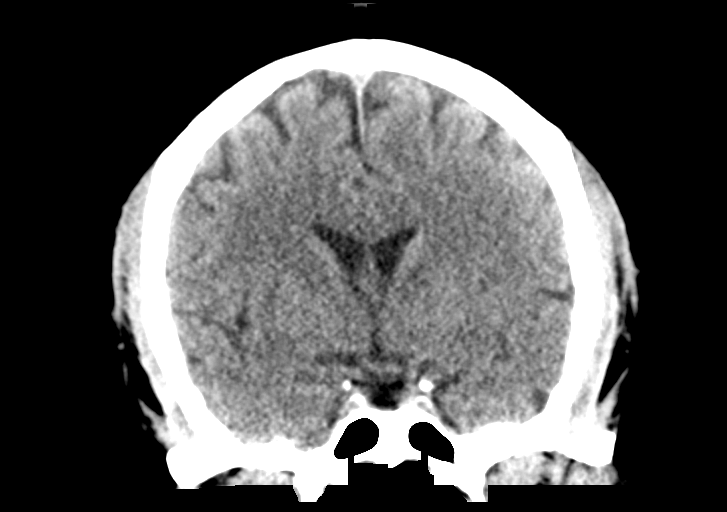

[Series 6: head without sag · sagittal · non-contrast · 0.33mm/px · 3 of 47 slices shown]
[im 16/47  brain]
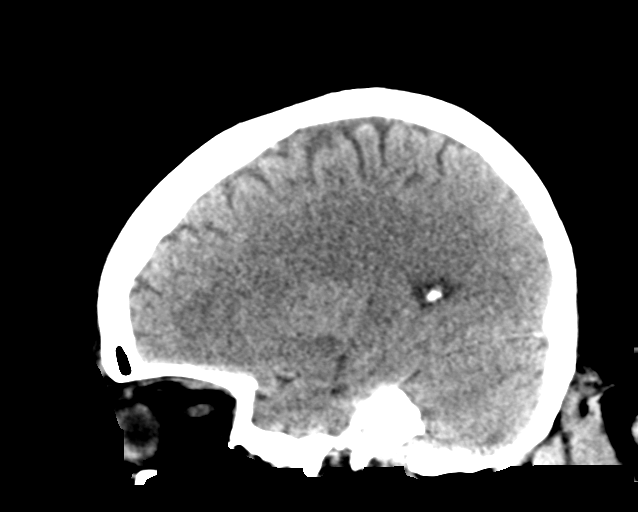
[im 24/47  brain]
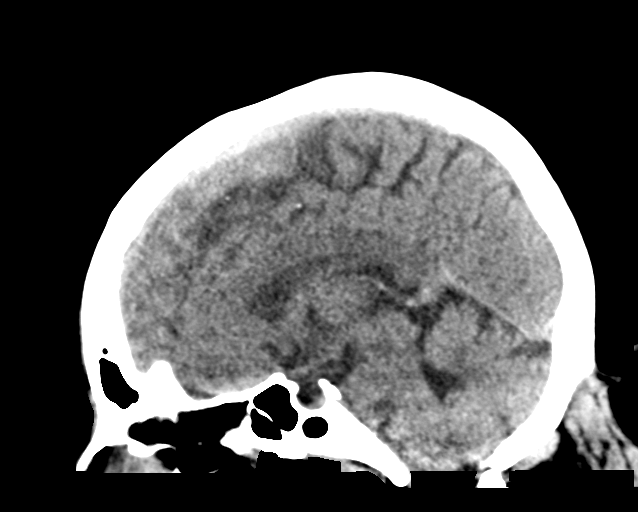
[im 31/47  brain]
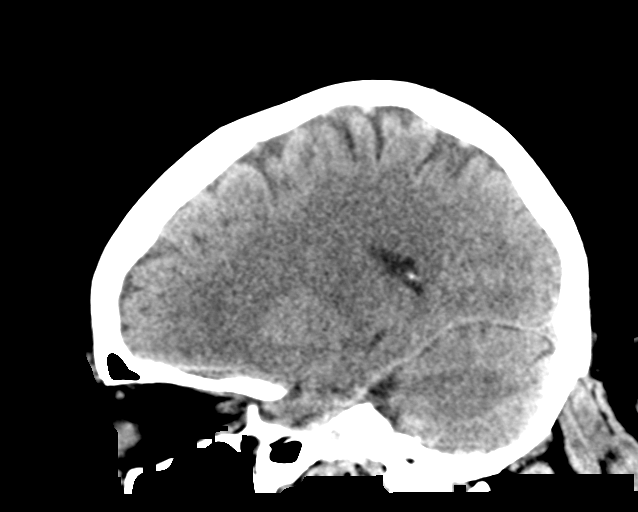

[15 of 47 positions shown; findings below may reference images not displayed]

FINDINGS: CT HEAD FINDINGS

Brain: No evidence of acute infarction, hemorrhage, hydrocephalus,
extra-axial collection or mass lesion/mass effect.

Vascular: No hyperdense vessel or unexpected calcification.

Skull: Normal. Negative for fracture or focal lesion.

Sinuses/Orbits: No acute finding.

Other: None.

CT CERVICAL SPINE FINDINGS

Alignment: Facet joints are aligned without dislocation or traumatic
listhesis. Dens and lateral masses are aligned.

Skull base and vertebrae: No acute fracture. No primary bone lesion
or focal pathologic process.

Soft tissues and spinal canal: No prevertebral fluid or swelling. No
visible canal hematoma.

Disc levels: Minimal uncovertebral spurring within the thoracic
spine. Disc heights are relatively preserved. No advanced facet
arthropathy.

Upper chest: Included lung apices are clear.

Other: A tracheostomy tube is present.
IMPRESSION: 1. No acute intracranial findings.
2. No acute fracture or traumatic listhesis of the cervical spine.

## 2021-10-30 IMAGING — DX DG ANKLE COMPLETE 3+V*R*
3 series · 3 of 3 positions shown · non-contrast
Comparison: None.

CLINICAL DATA: Fall

EXAM:
RIGHT ANKLE - COMPLETE 3+ VIEW

[ankle ap]
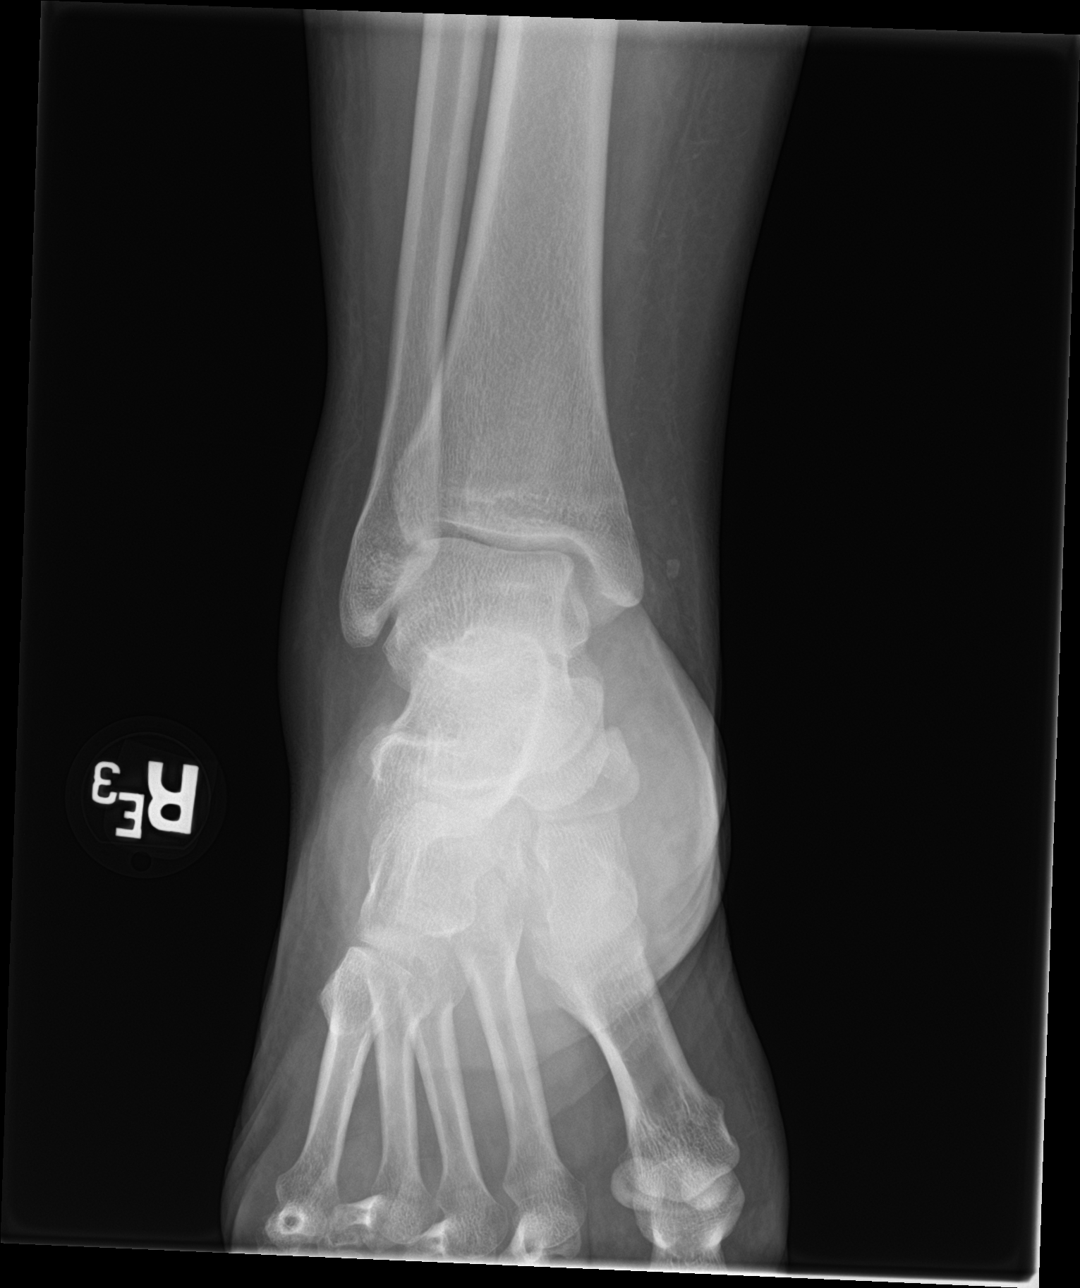

[ankle obl]
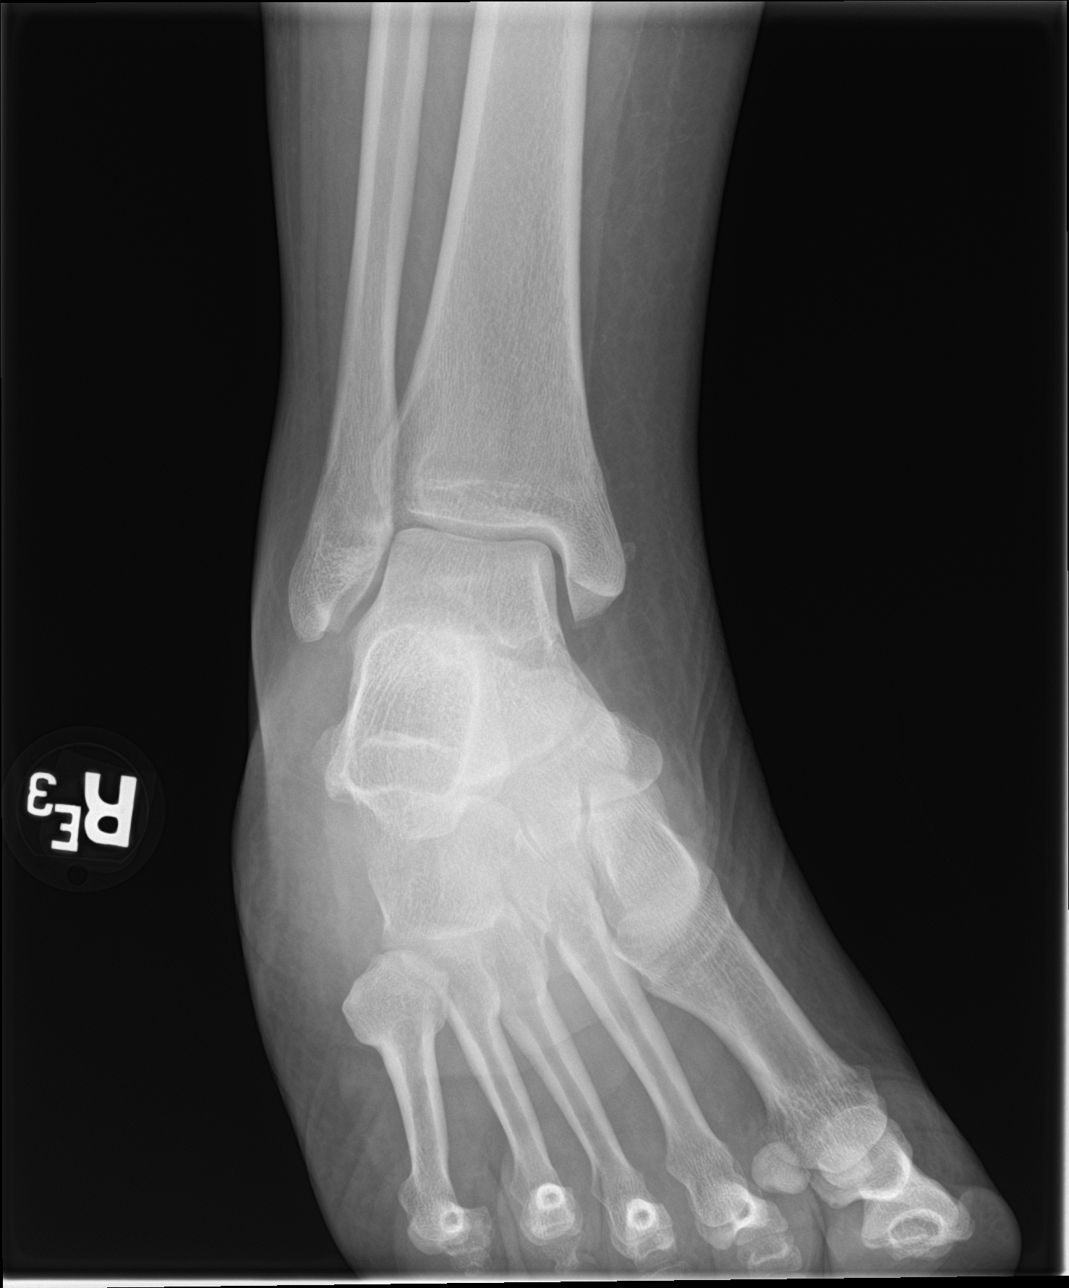

[ankle lat]
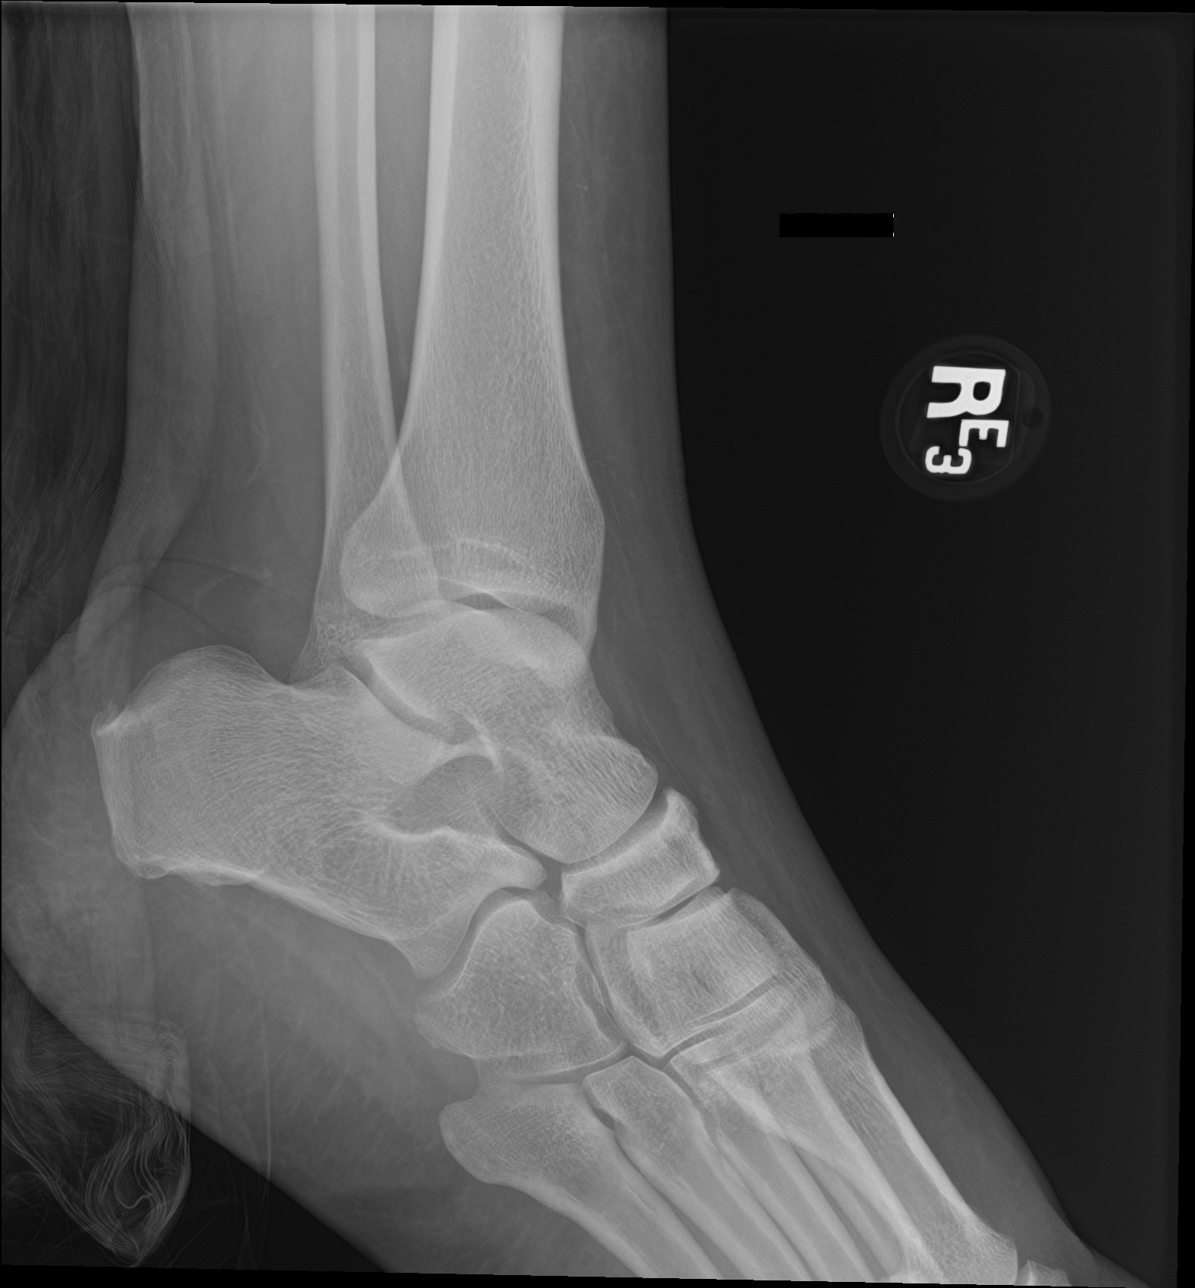

[3 of 3 positions shown; findings below may reference images not displayed]

FINDINGS: There is no evidence of fracture, dislocation, or joint effusion.
There is no evidence of arthropathy or other focal bone abnormality.
Soft tissues are unremarkable.
IMPRESSION: Negative.

## 2021-10-30 IMAGING — DX DG KNEE 1-2V*R*
2 series · 2 of 2 positions shown · non-contrast
Comparison: None.

CLINICAL DATA: Fall

EXAM:
RIGHT KNEE - 1-2 VIEW

[knee ap]
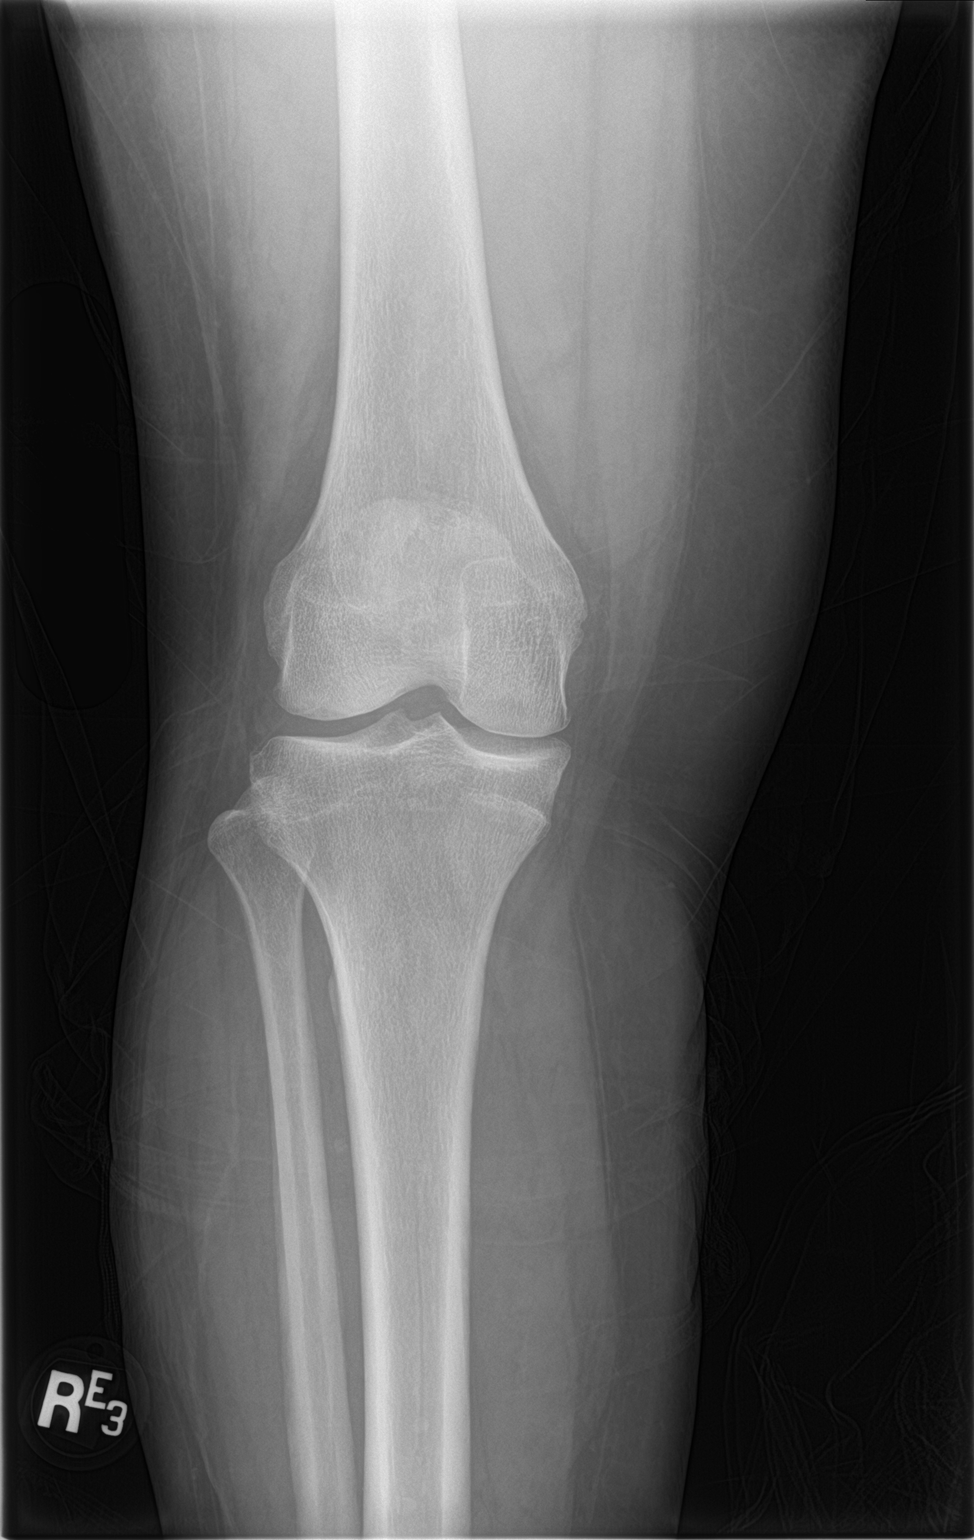

[knee lat]
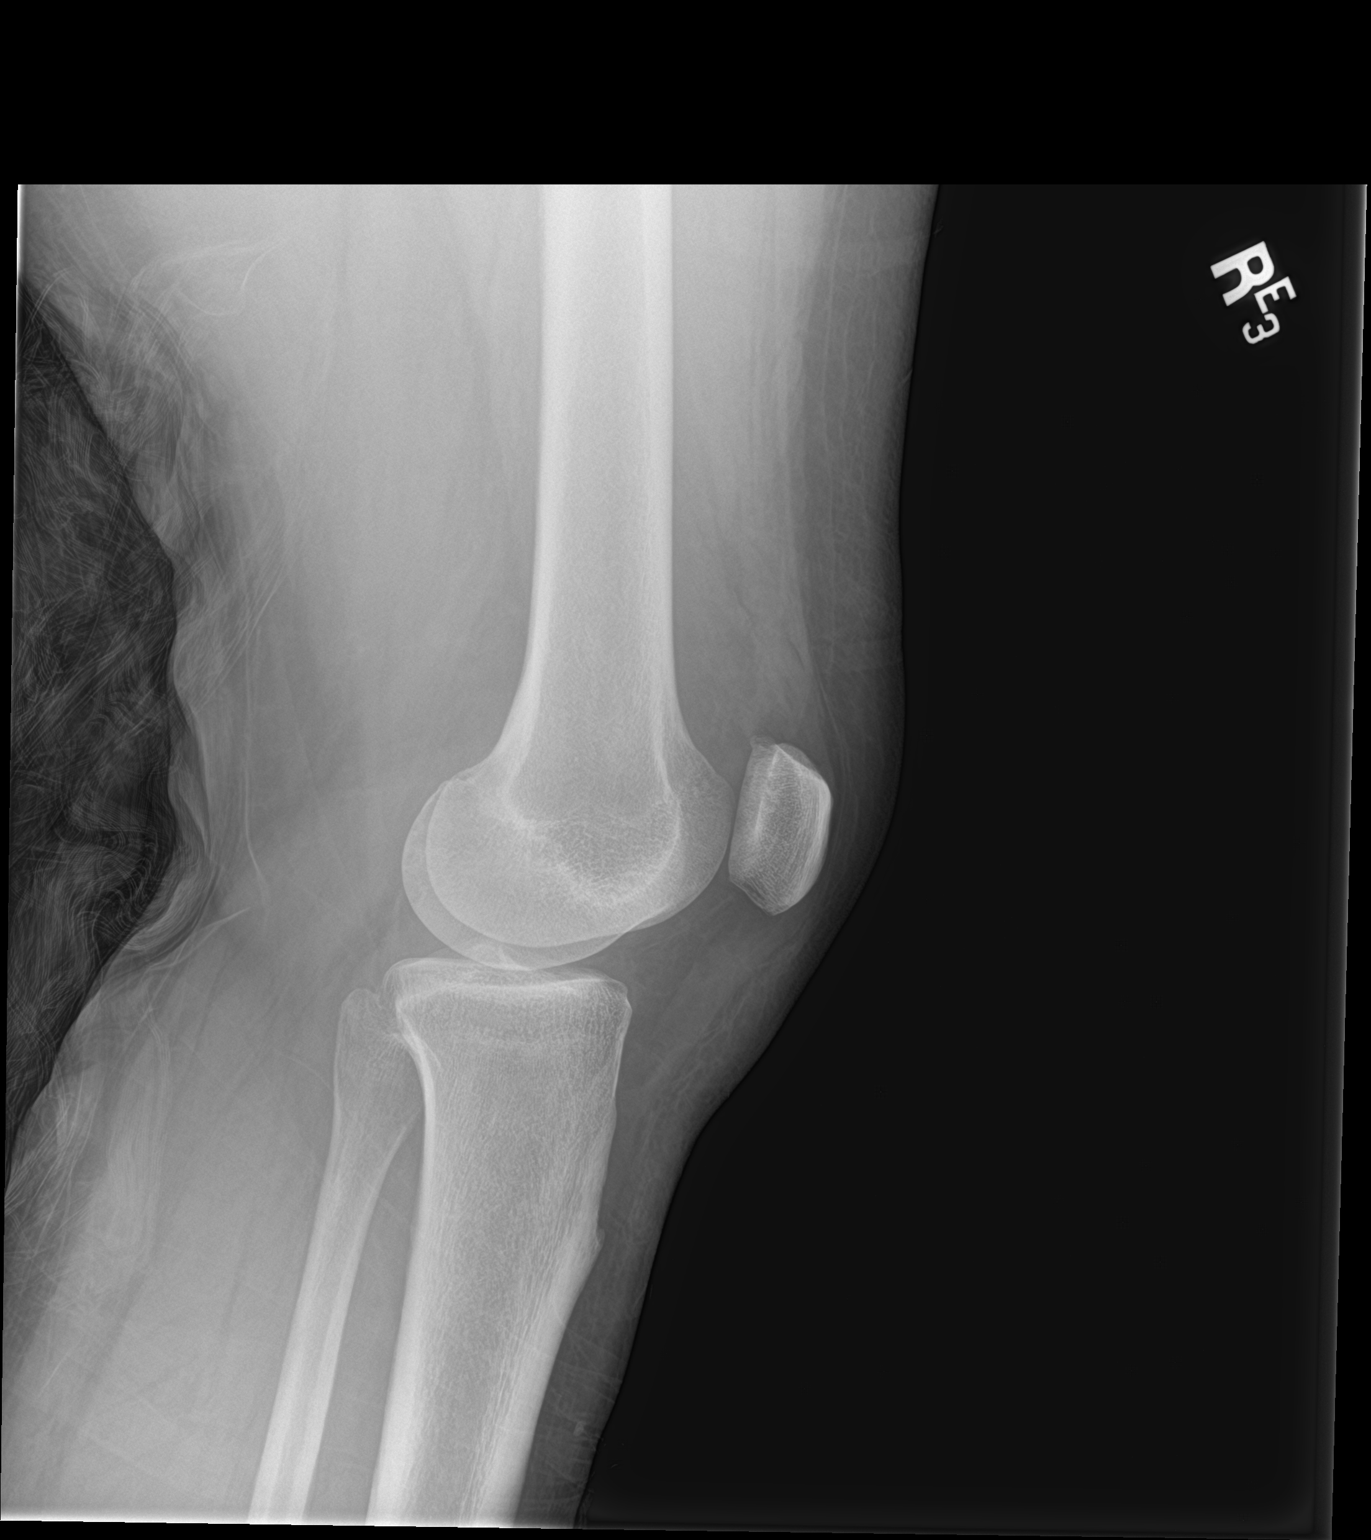

[2 of 2 positions shown; findings below may reference images not displayed]

FINDINGS: No evidence of fracture, dislocation, or joint effusion. No evidence
of arthropathy or other focal bone abnormality. Soft tissues are
unremarkable.
IMPRESSION: Negative.

## 2021-10-30 IMAGING — DX DG LUMBAR SPINE 2-3V
2 series · 2 of 2 positions shown · non-contrast
Comparison: 09/02/2018

CLINICAL DATA: Fall

EXAM:
LUMBAR SPINE - 2-3 VIEW

[l-spine ap]
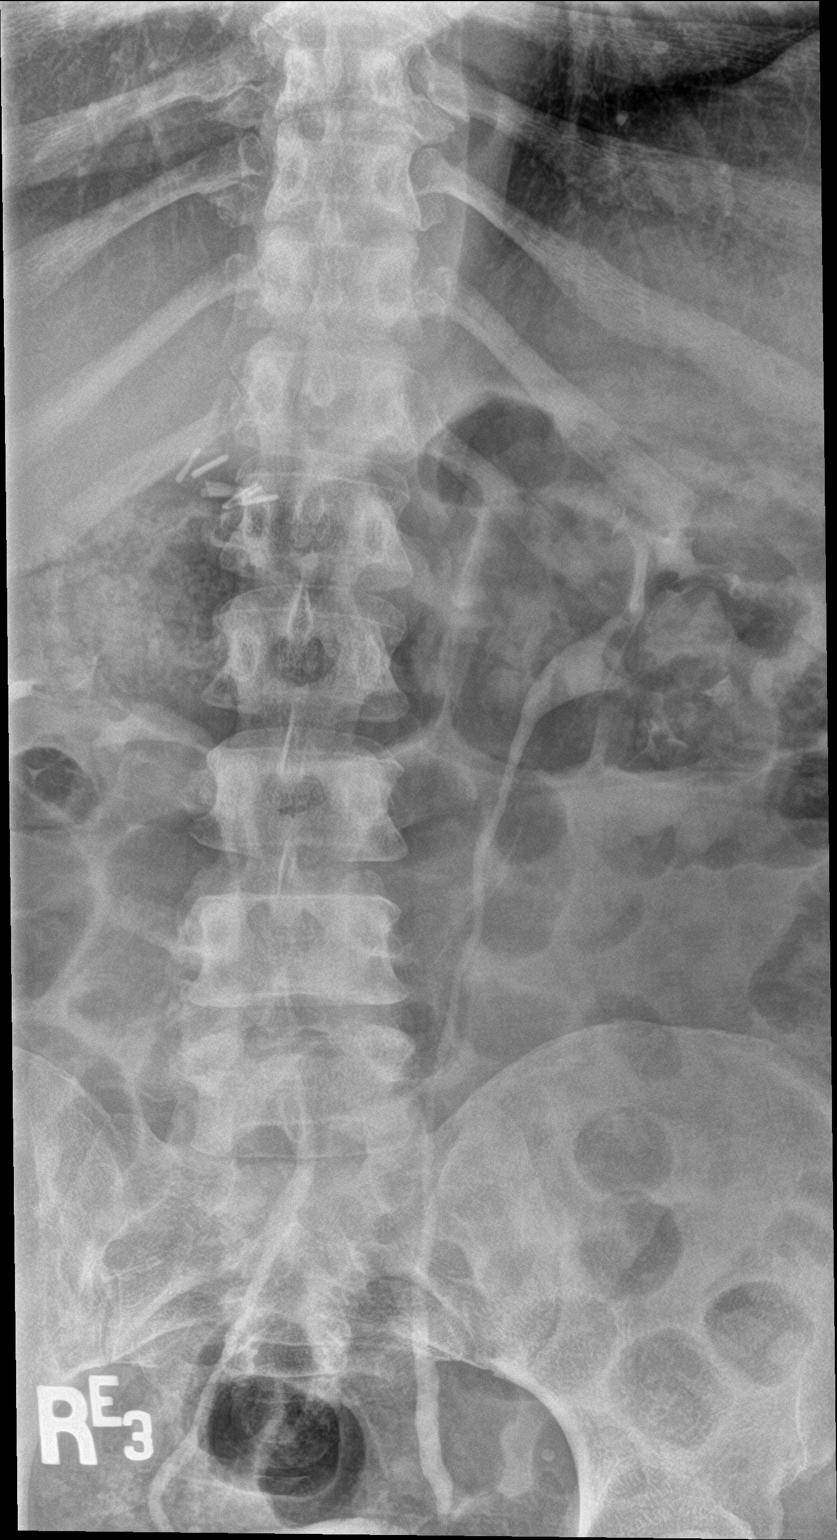

[l-spine lat]
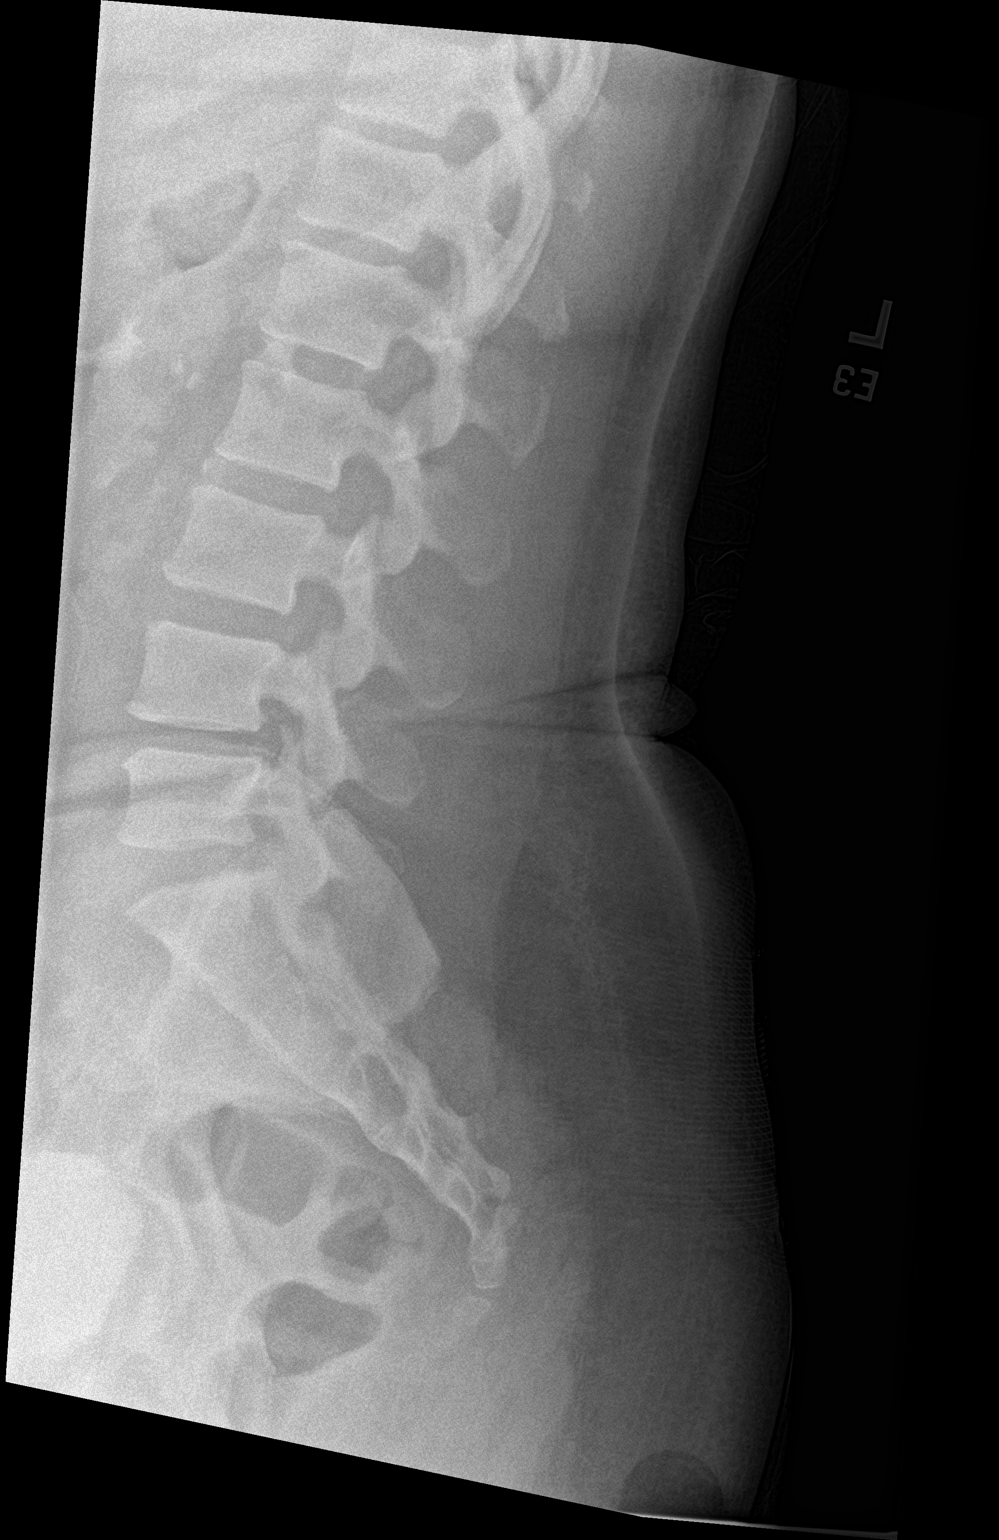

[2 of 2 positions shown; findings below may reference images not displayed]

FINDINGS: There is no evidence of lumbar spine fracture. Alignment is normal.
Intervertebral disc spaces are maintained.
IMPRESSION: Negative.

## 2021-10-30 IMAGING — DX DG RIBS W/ CHEST 3+V*R*
3 series · 3 of 3 positions shown · non-contrast
Comparison: 09/16/2020

CLINICAL DATA: Fall

EXAM:
RIGHT RIBS AND CHEST - 3+ VIEW

[chest ap]
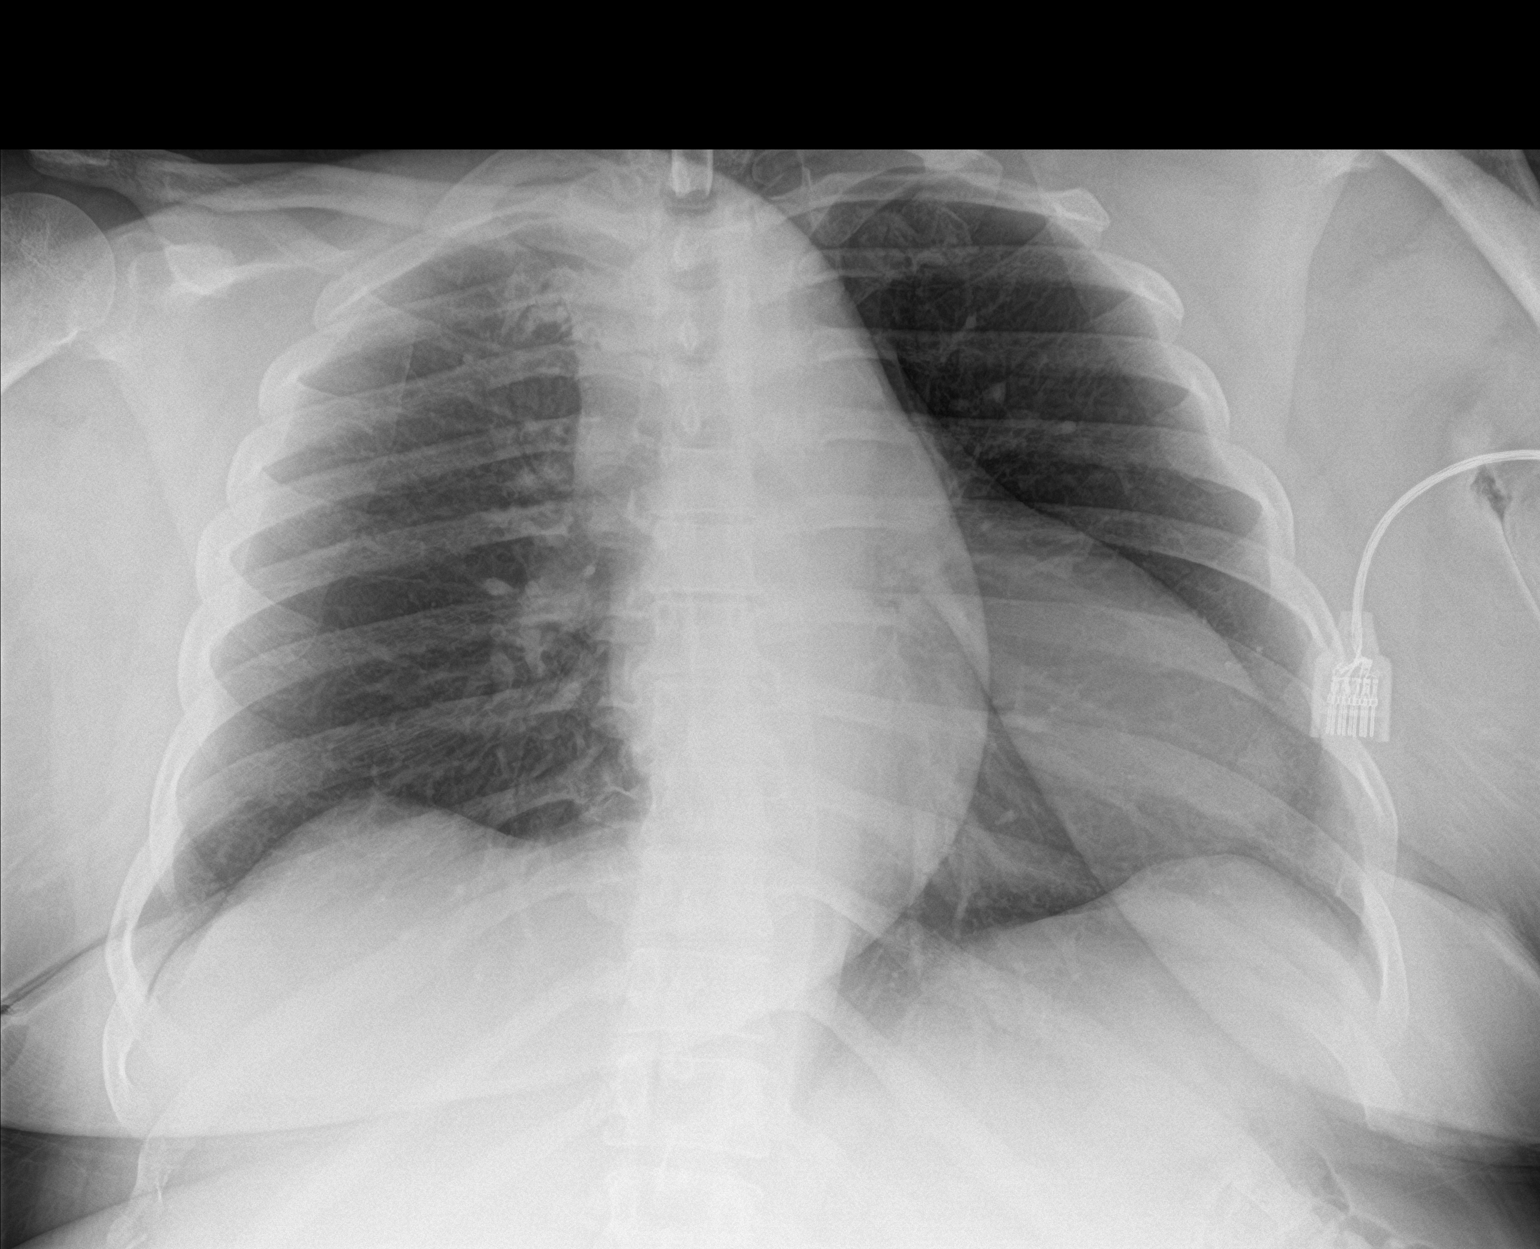

[rib ap]
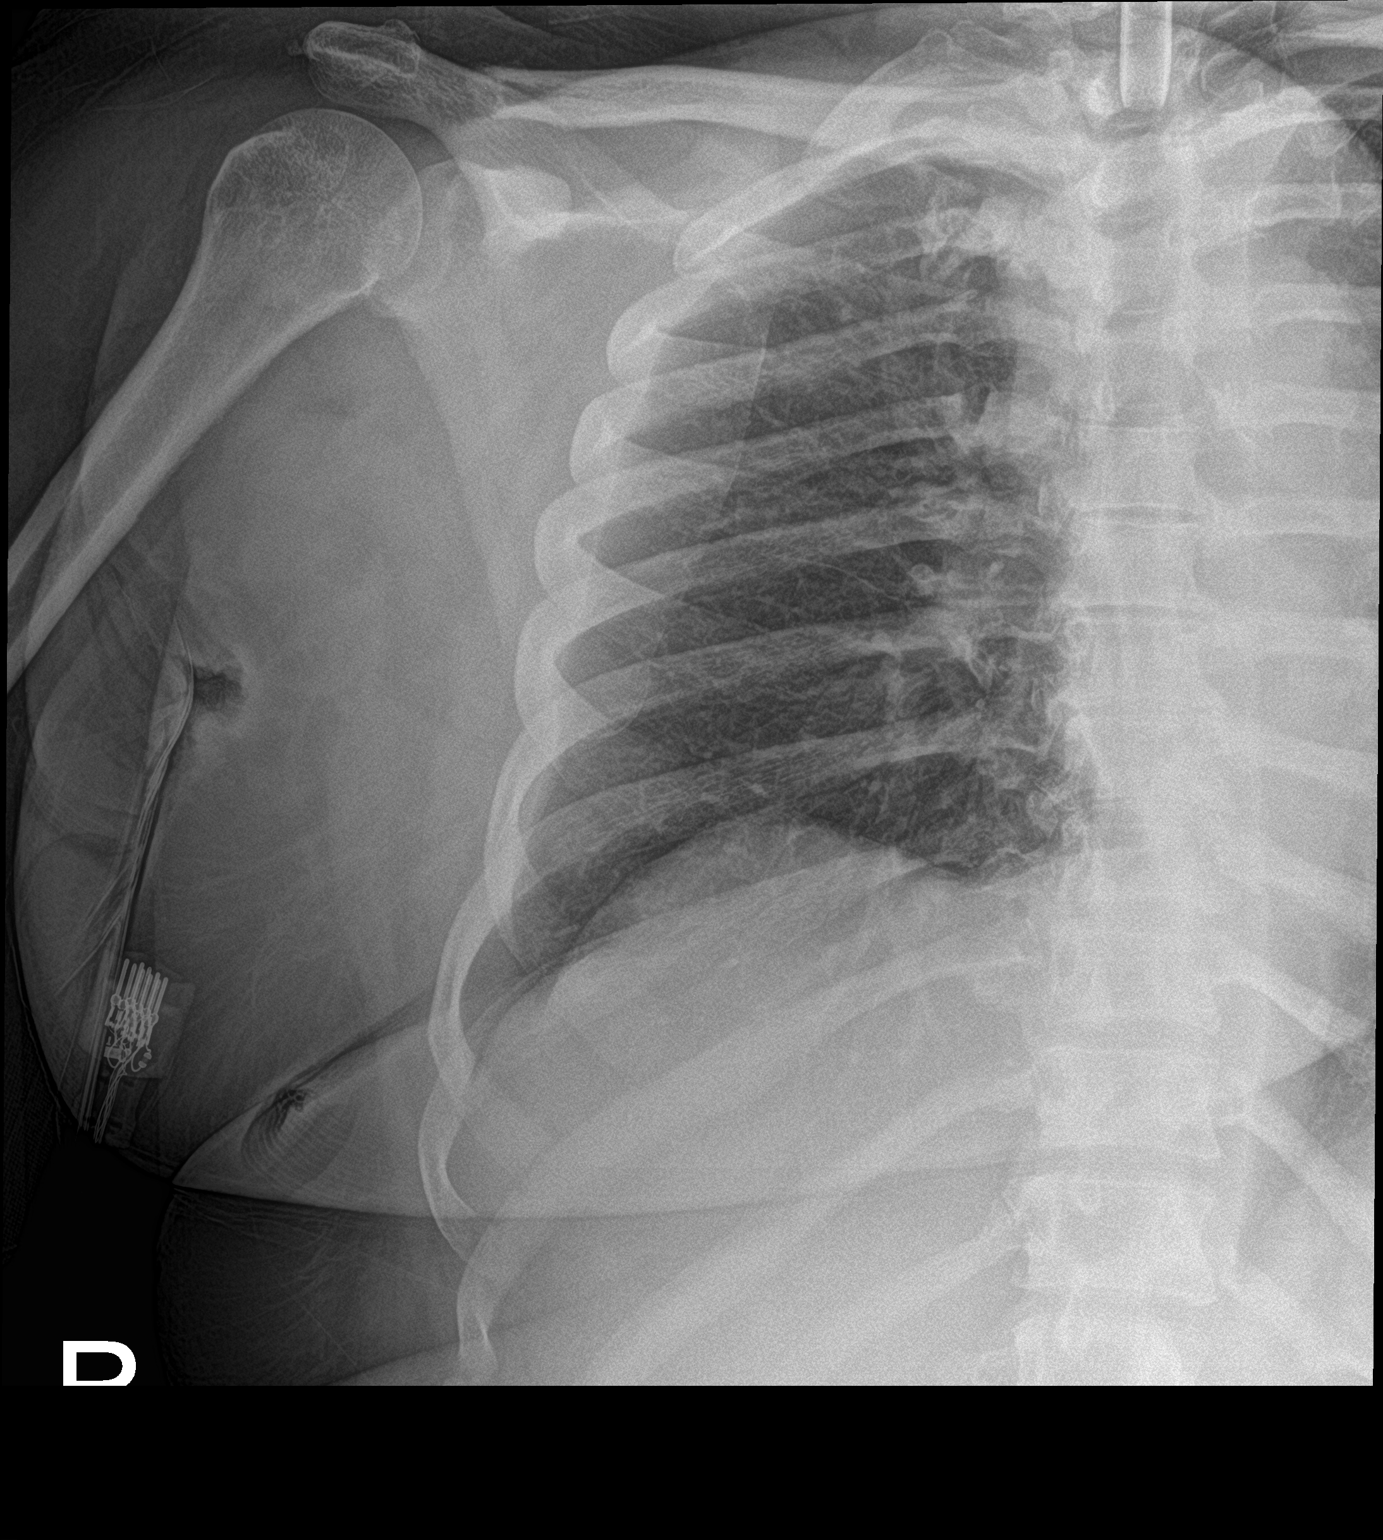

[rib ap obl]
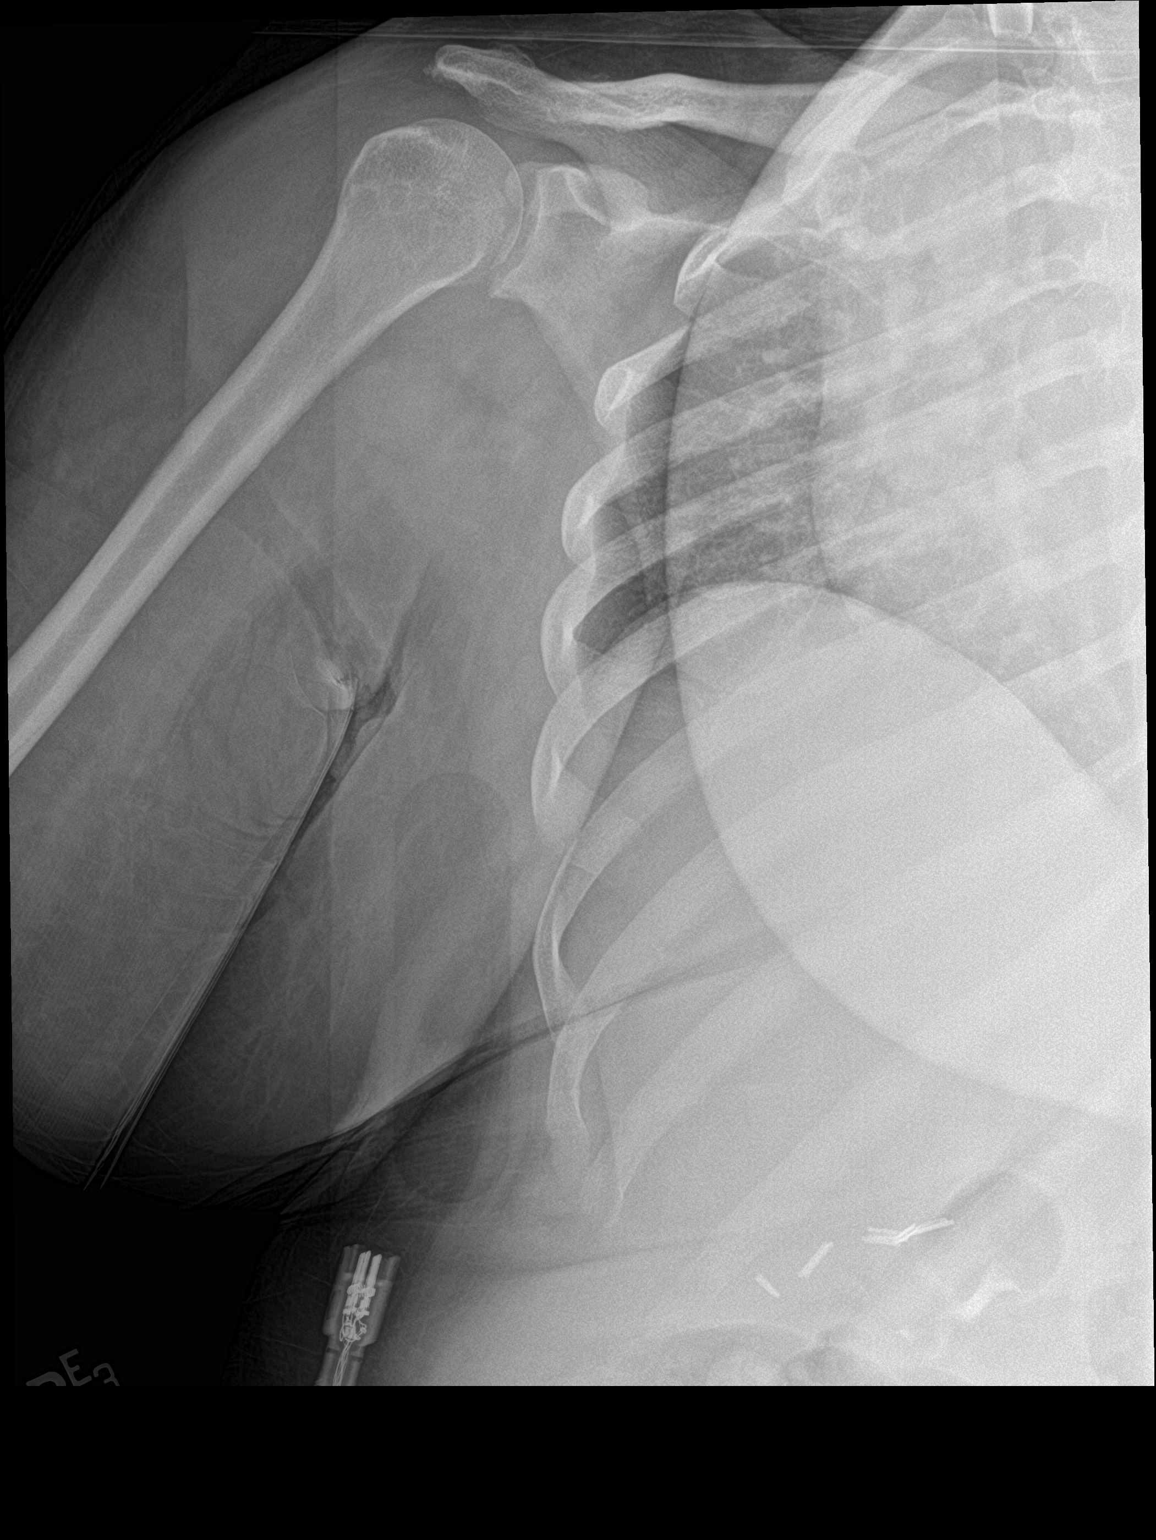

[3 of 3 positions shown; findings below may reference images not displayed]

FINDINGS: Tracheostomy tube tip is at the level of the clavicular heads.
Normal cardiomediastinal contours and pleural spaces. Lungs are
clear.

There is no right rib fracture.
IMPRESSION: No right rib fracture.

## 2021-10-30 IMAGING — DX DG HIP (WITH OR WITHOUT PELVIS) 2-3V*R*
3 series · 3 of 3 positions shown · non-contrast
Comparison: None.

CLINICAL DATA: Fall

EXAM:
DG HIP (WITH OR WITHOUT PELVIS) 2-3V RIGHT

[pelvis ap]
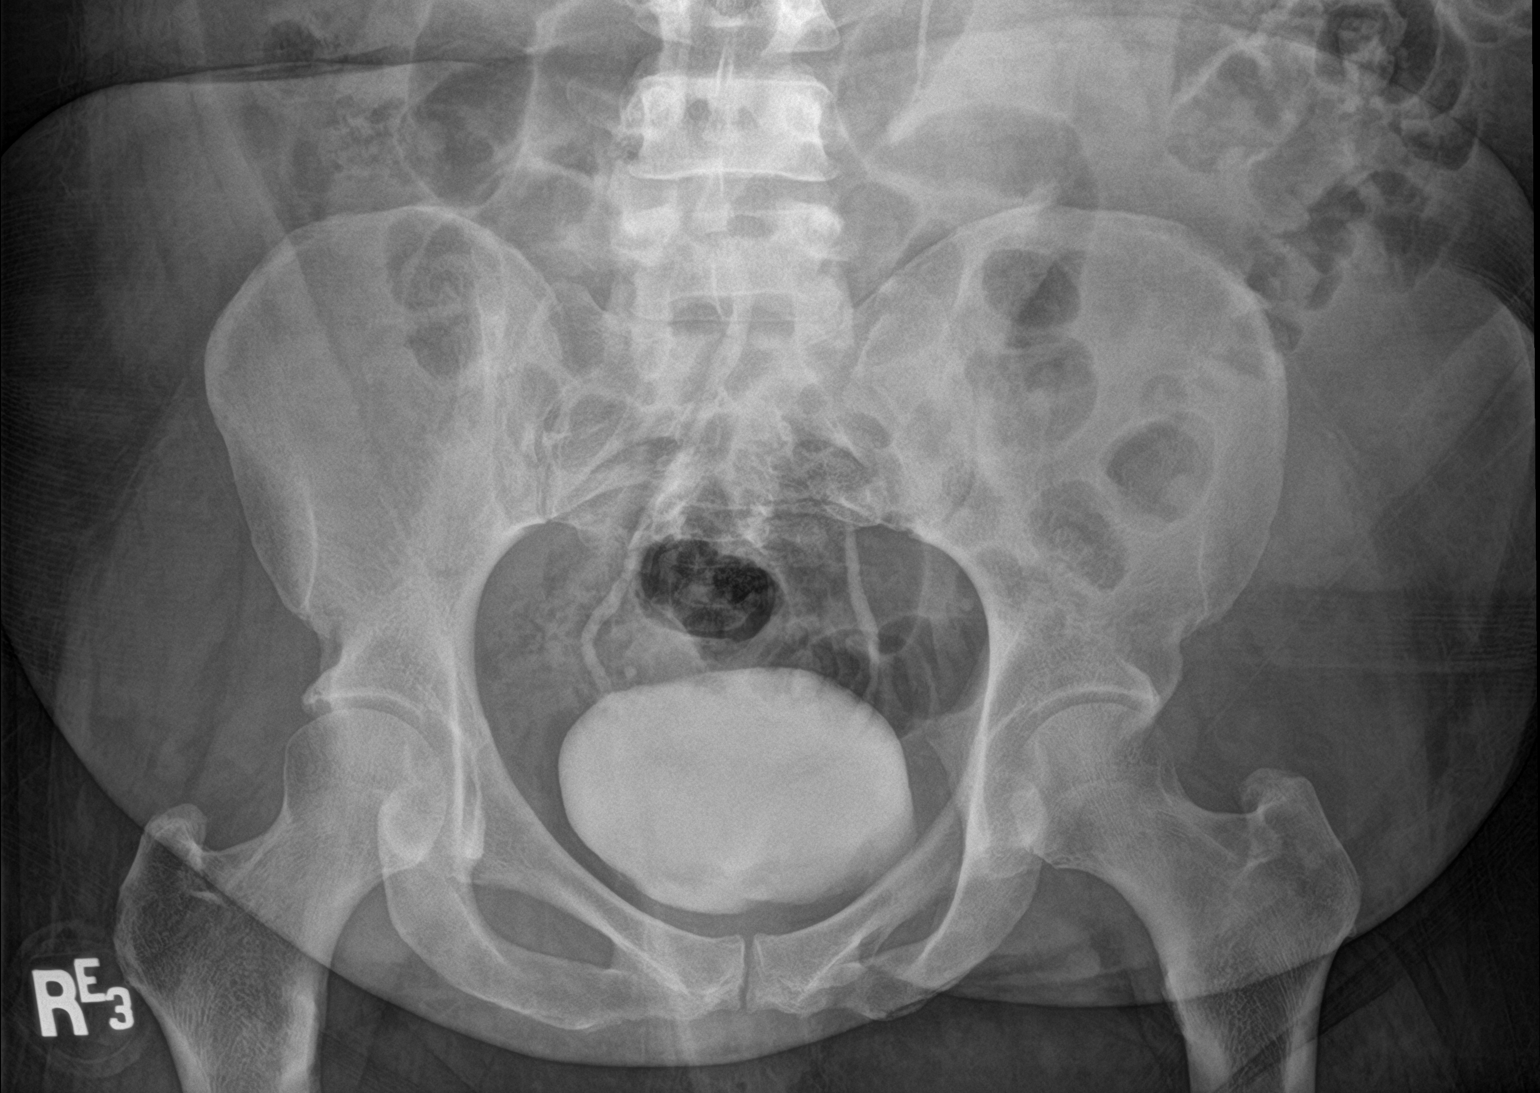

[hip ap]
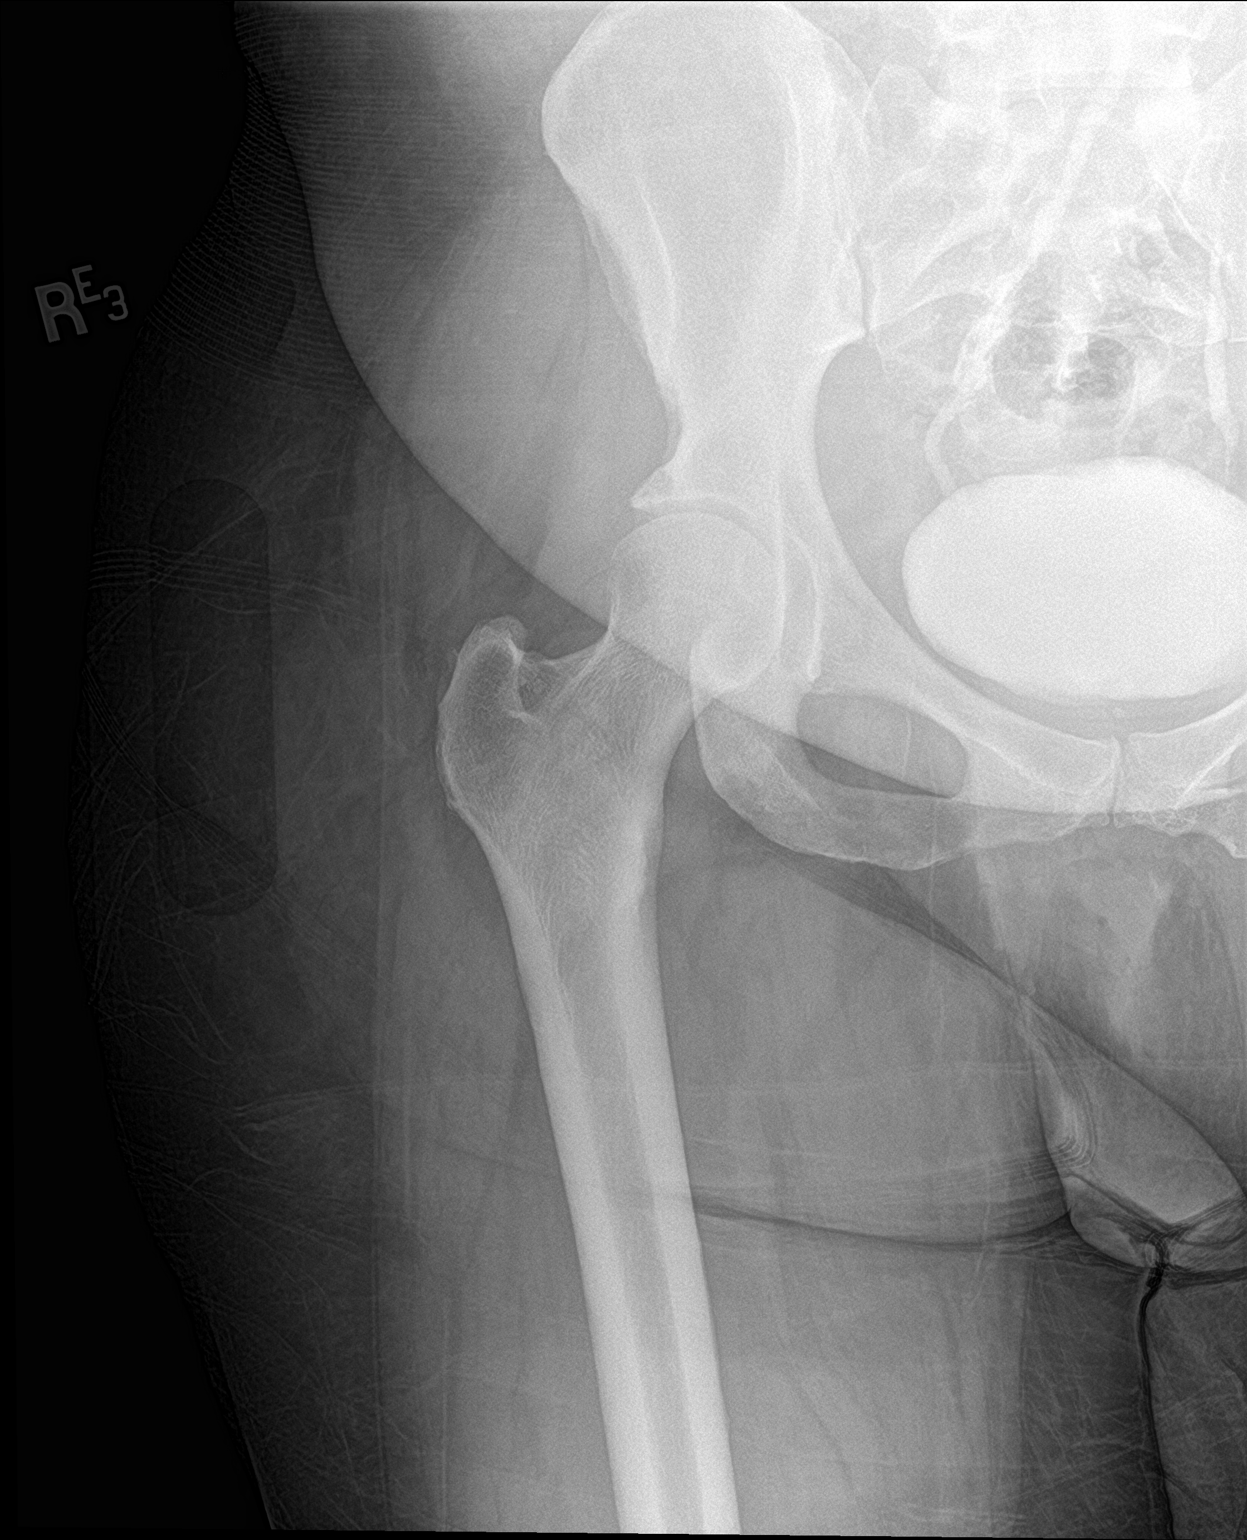

[hip lat]
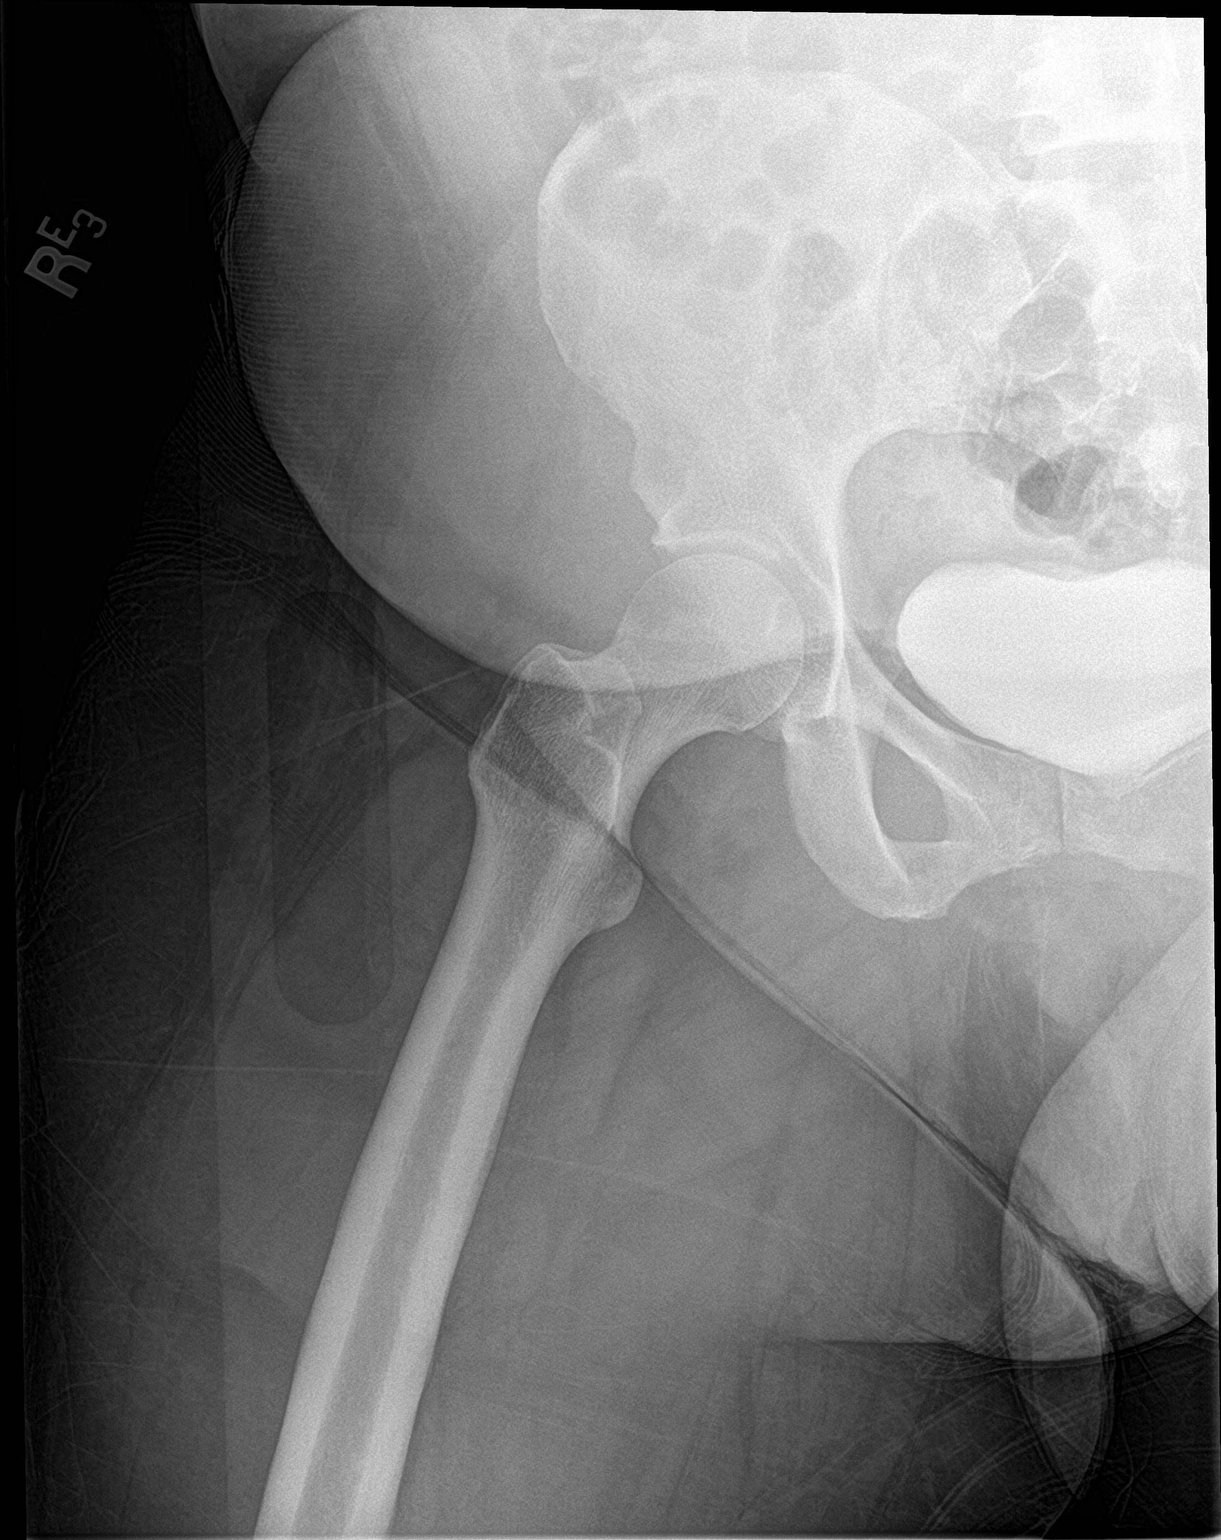

[3 of 3 positions shown; findings below may reference images not displayed]

FINDINGS: There is no evidence of hip fracture or dislocation. There is no
evidence of arthropathy or other focal bone abnormality.
IMPRESSION: Negative.

## 2021-10-30 IMAGING — DX DG THORACIC SPINE 2V
2 series · 2 of 2 positions shown · non-contrast
Comparison: None.

CLINICAL DATA: Fall

EXAM:
THORACIC SPINE 2 VIEWS

[t-spine ap]
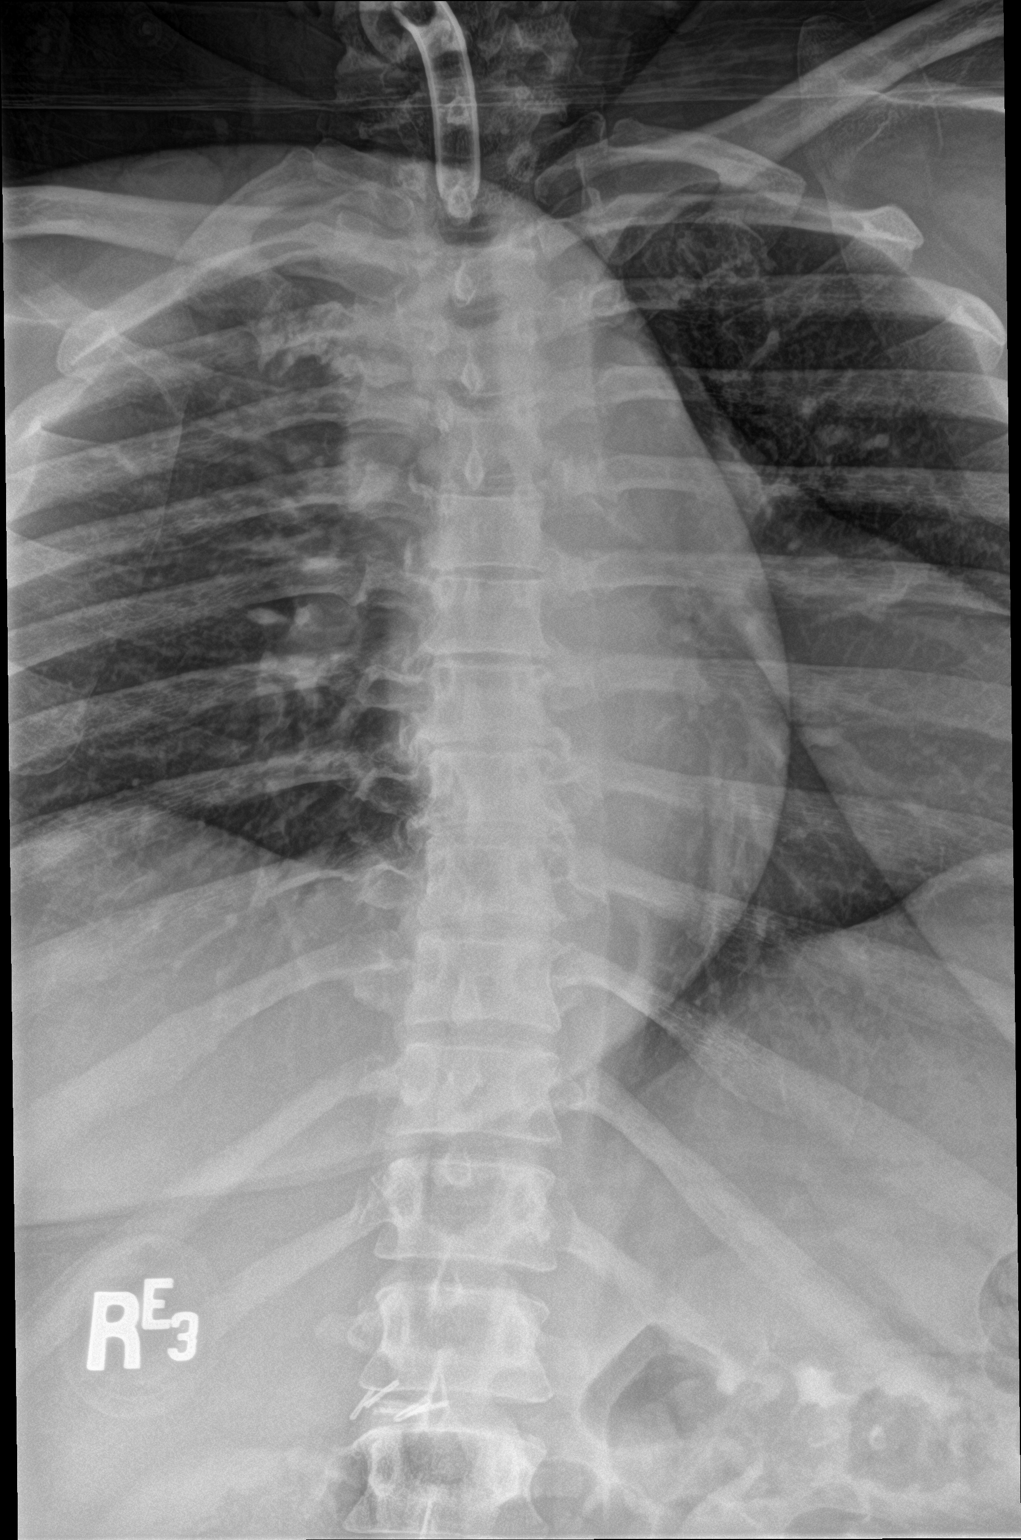

[t-spine lat]
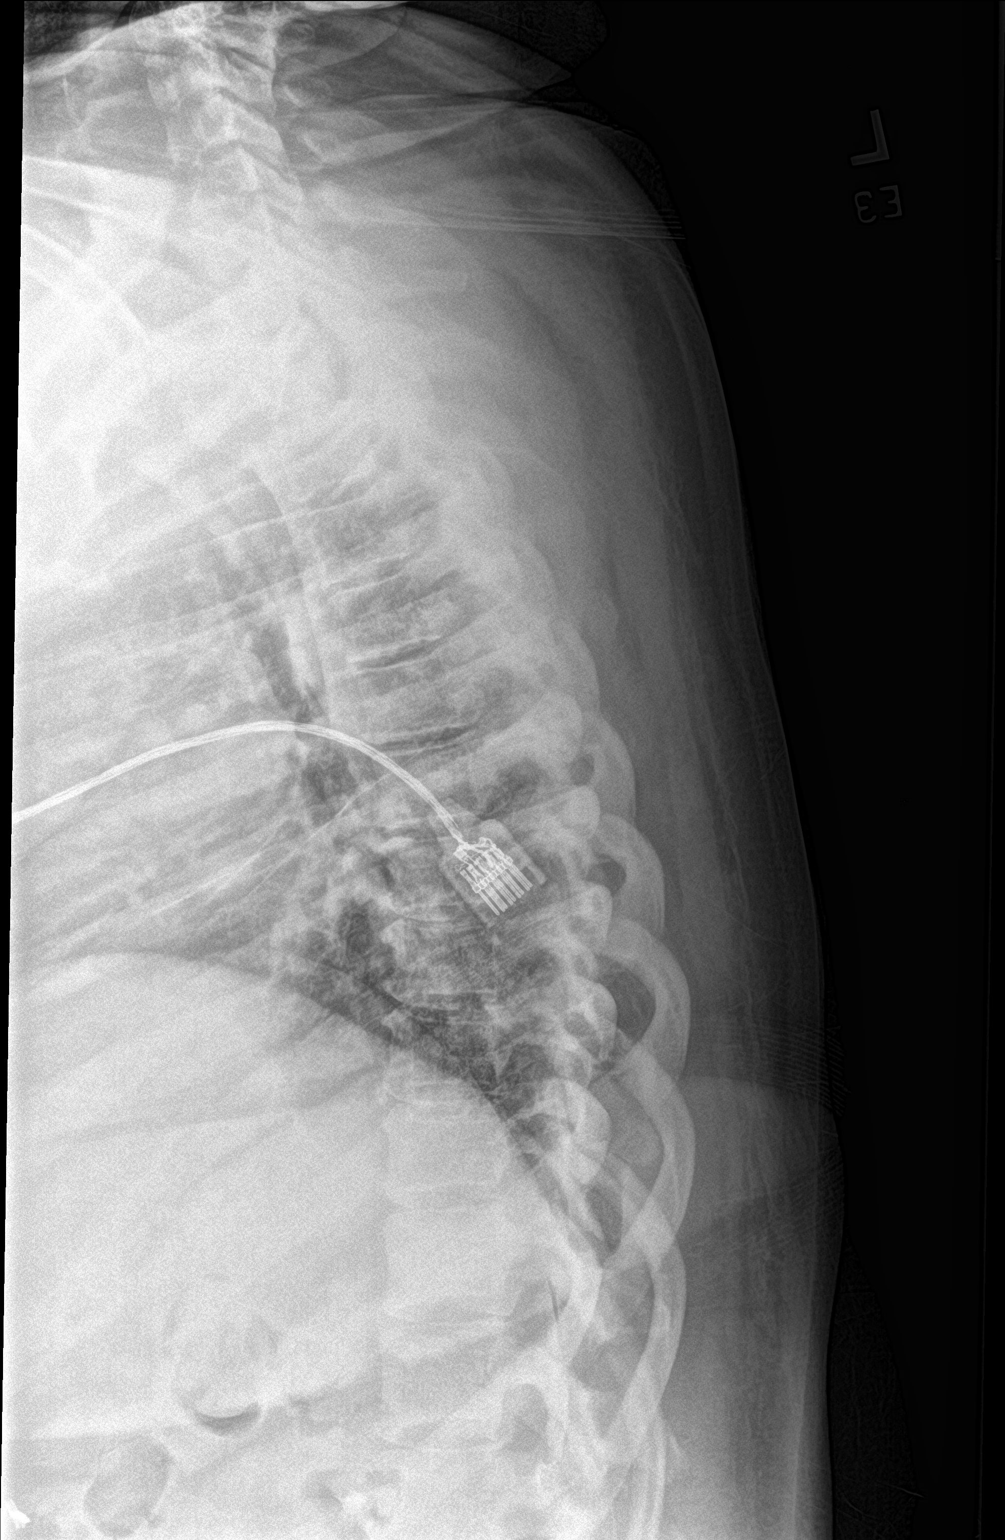

[2 of 2 positions shown; findings below may reference images not displayed]

FINDINGS: There is no evidence of thoracic spine fracture. Alignment is
normal. No other significant bone abnormalities are identified.
IMPRESSION: Negative.

## 2021-10-30 IMAGING — DX DG FOOT COMPLETE 3+V*R*
3 series · 3 of 3 positions shown · non-contrast
Comparison: None.

CLINICAL DATA: Fall

EXAM:
RIGHT FOOT COMPLETE - 3+ VIEW

[foot ap]
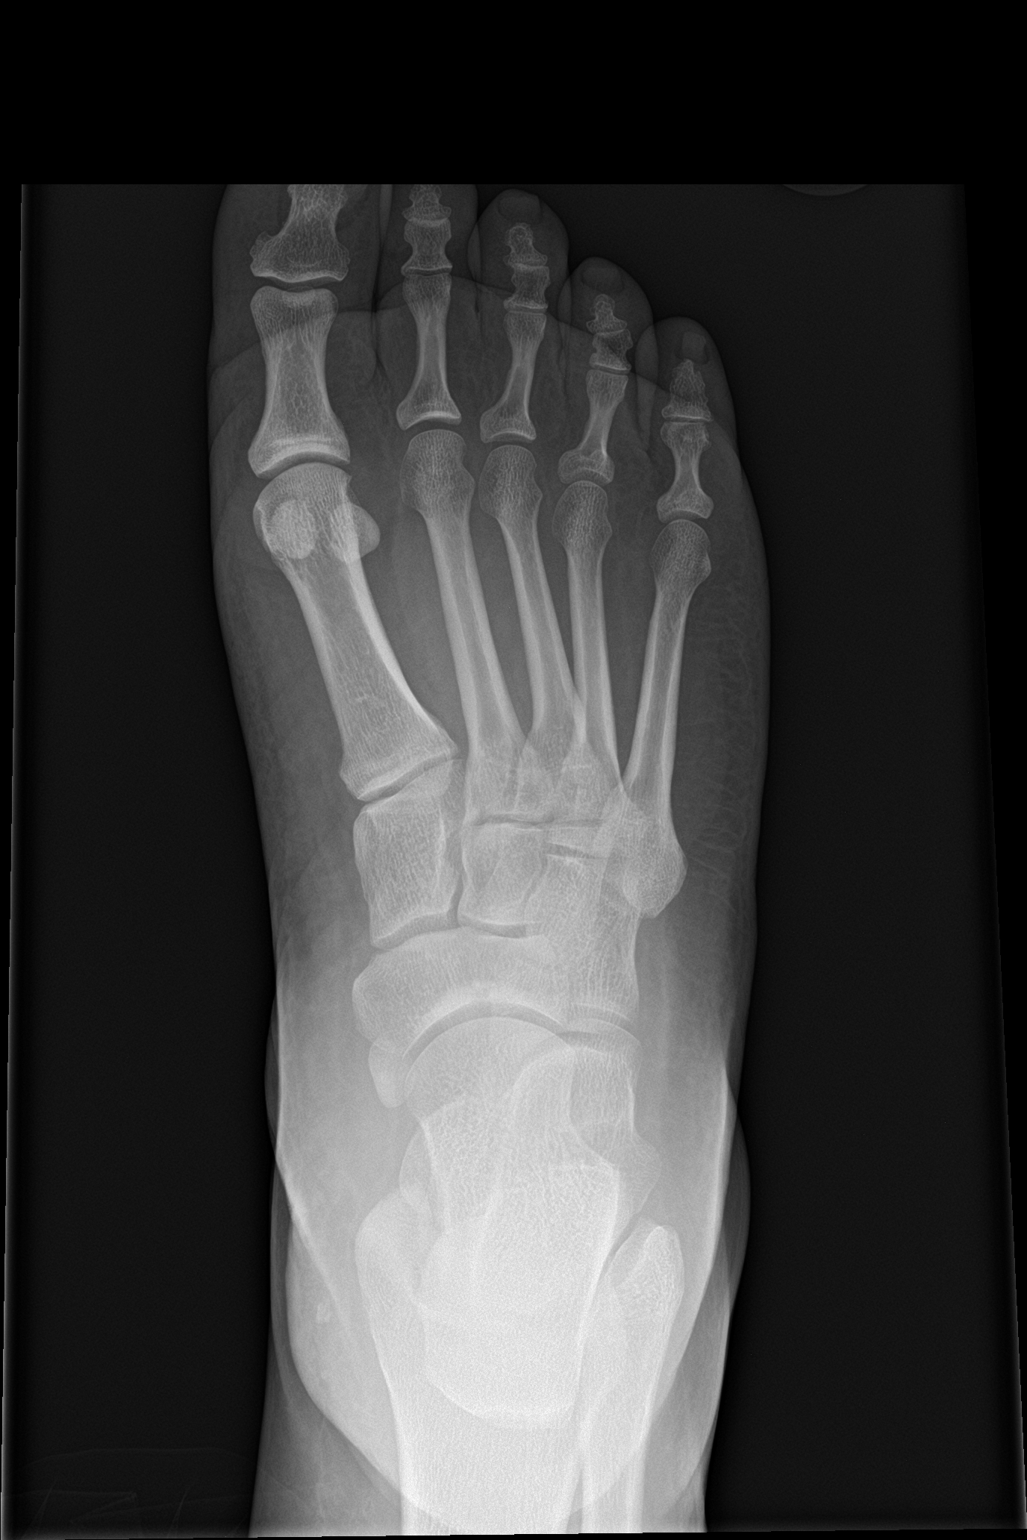

[foot obl]
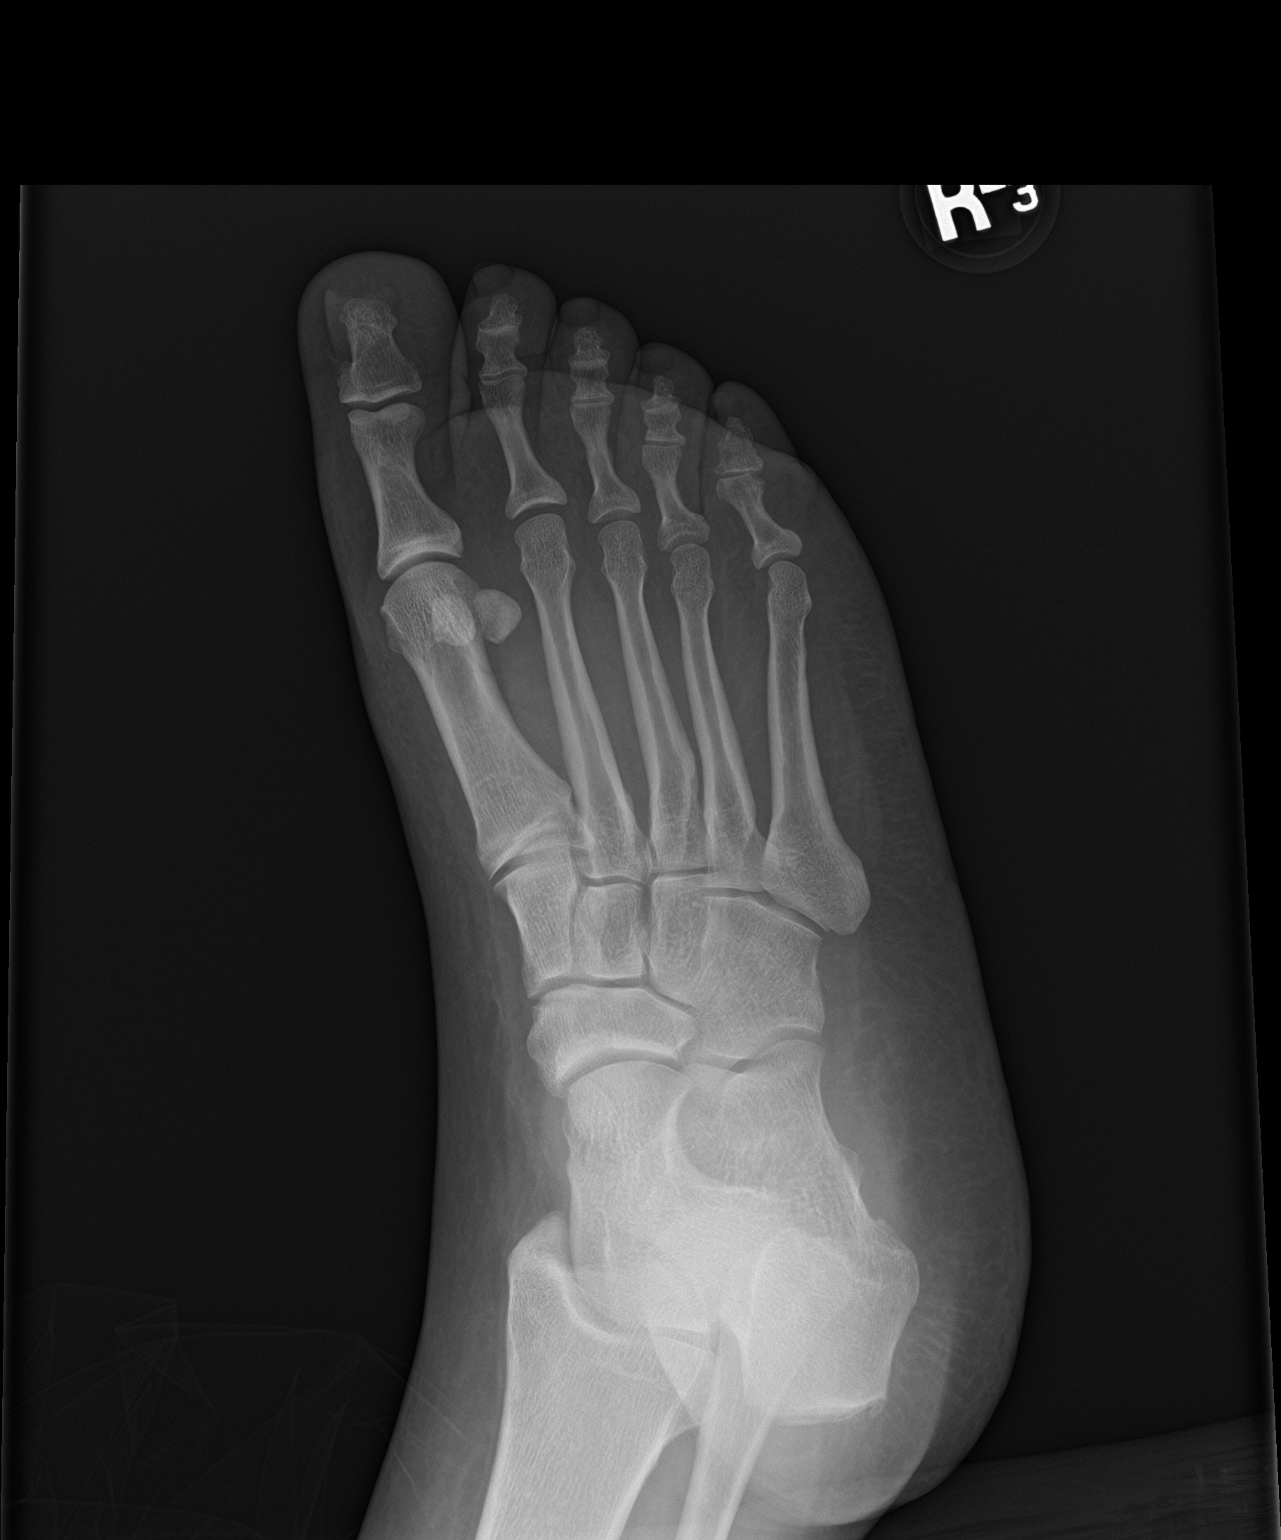

[foot lat]
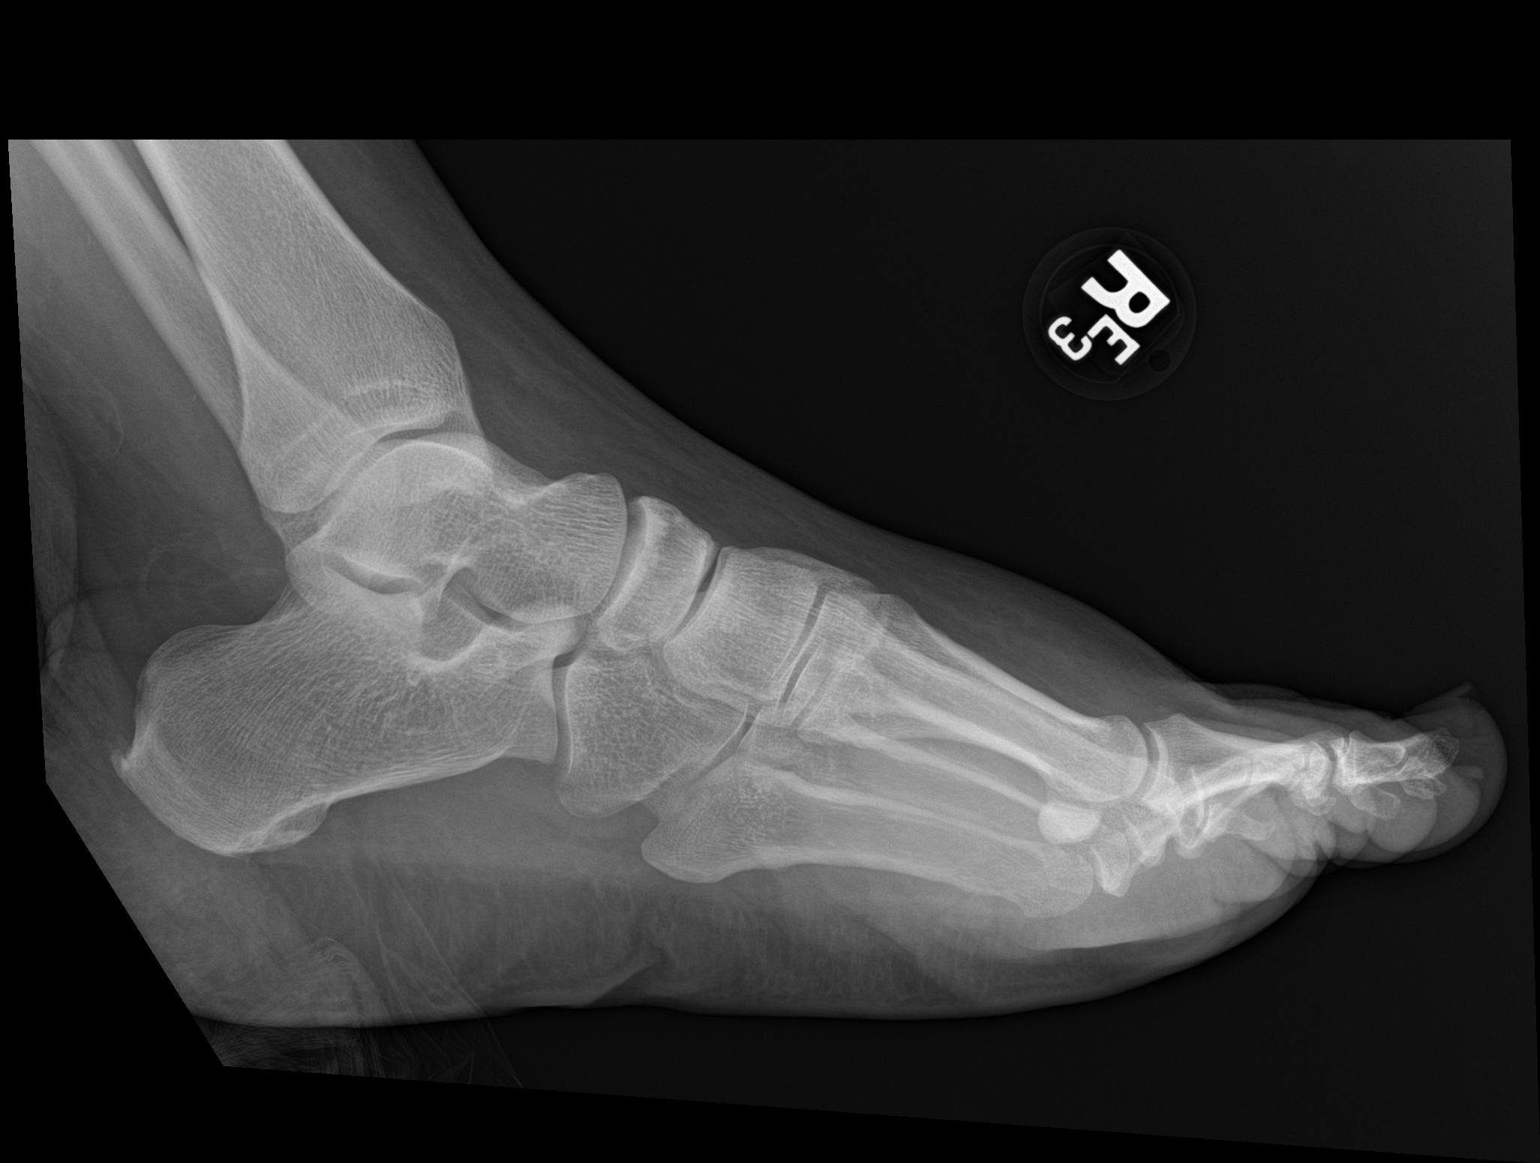

[3 of 3 positions shown; findings below may reference images not displayed]

FINDINGS: There is no evidence of fracture or dislocation. There is no
evidence of arthropathy or other focal bone abnormality. Soft
tissues are unremarkable.
IMPRESSION: Negative.

## 2021-10-30 IMAGING — DX DG SHOULDER 2+V*R*
3 series · 3 of 3 positions shown · non-contrast
Comparison: None.

CLINICAL DATA: Fall

EXAM:
RIGHT SHOULDER - 2+ VIEW

[shoulder grashey]
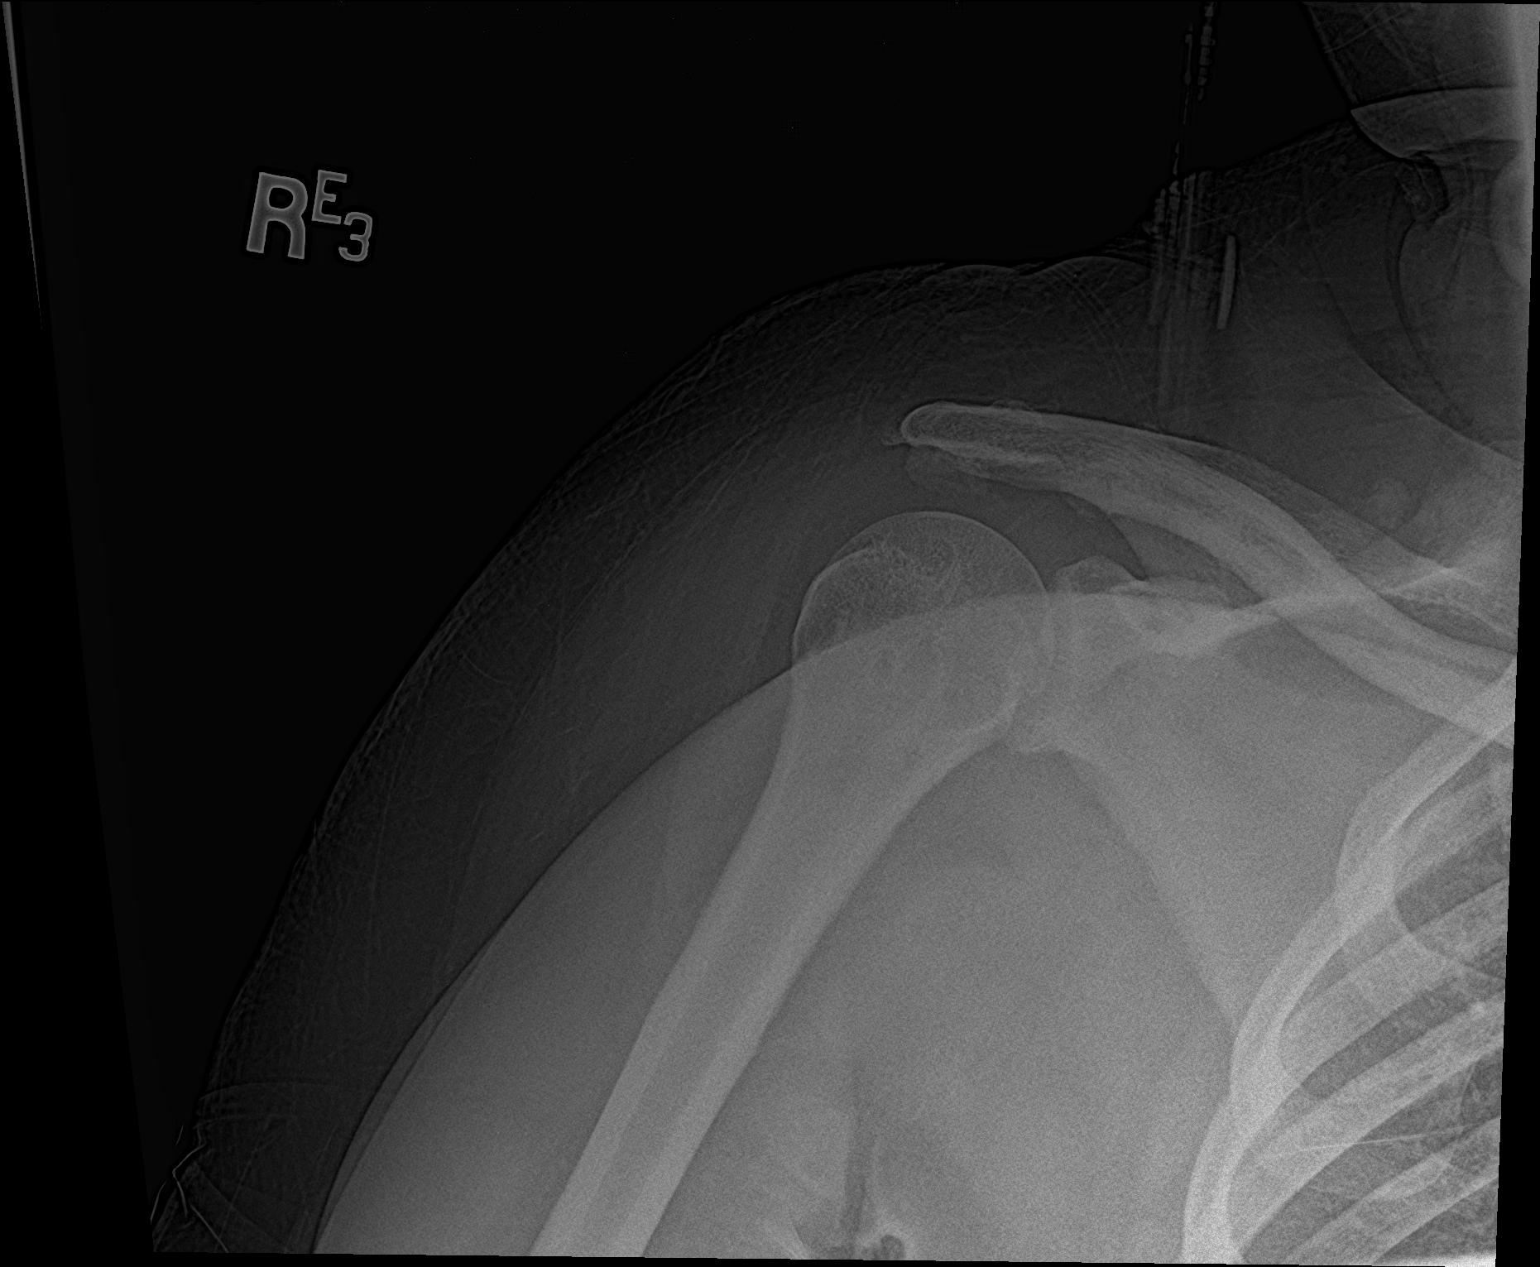

[shoulder y view]
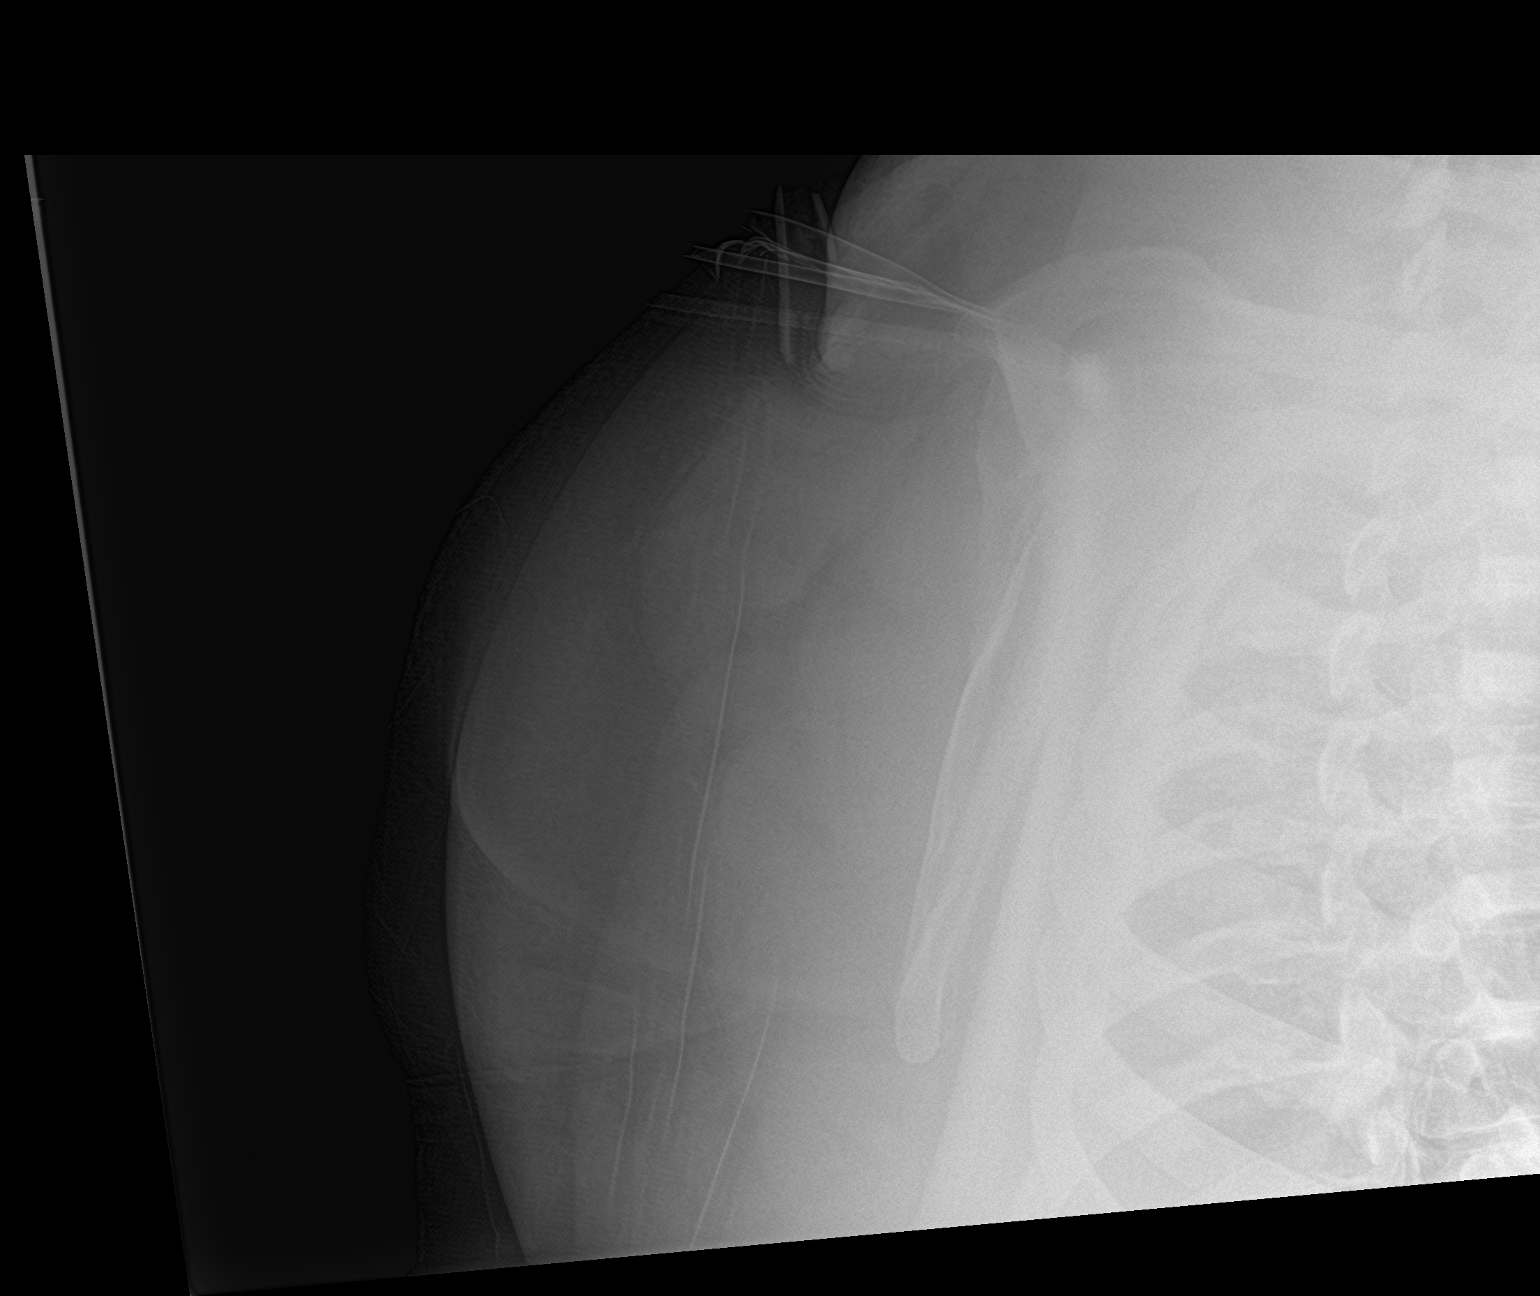

[shoulder ap neutral]
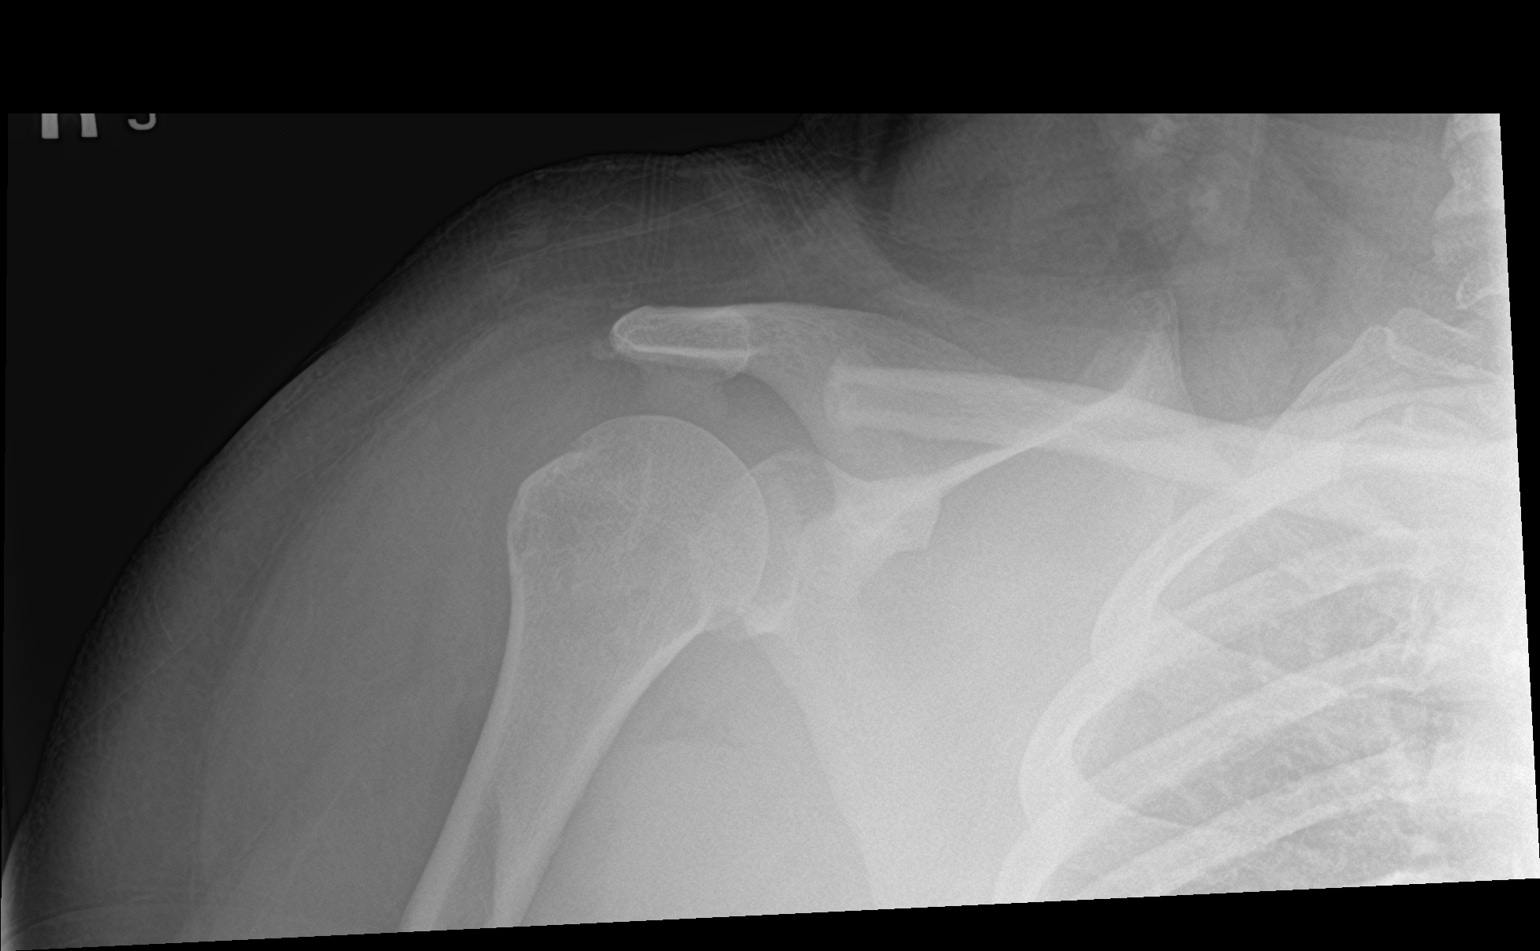

[3 of 3 positions shown; findings below may reference images not displayed]

FINDINGS: There is no evidence of fracture or dislocation. There is no
evidence of arthropathy or other focal bone abnormality. Soft
tissues are unremarkable.
IMPRESSION: Negative.

## 2021-10-30 MED ORDER — LACTATED RINGERS IV BOLUS
1000.0000 mL | Freq: Once | INTRAVENOUS | Status: AC
Start: 2021-10-30 — End: 2021-10-30
  Administered 2021-10-30: 1000 mL via INTRAVENOUS

## 2021-10-30 MED ORDER — PROCHLORPERAZINE 25 MG RE SUPP: 25.0000 mg | Freq: Two times a day (BID) | RECTAL | 0 refills | Status: DC | PRN

## 2021-10-30 MED ORDER — HYDROMORPHONE HCL 1 MG/ML IJ SOLN
1.0000 mg | Freq: Once | INTRAMUSCULAR | Status: AC
  Administered 2021-10-30: 1 mg via INTRAVENOUS
  Filled 2021-10-30: qty 1

## 2021-10-30 MED ORDER — ONDANSETRON HCL 4 MG/2ML IJ SOLN
INTRAMUSCULAR | Status: AC
  Filled 2021-10-30: qty 2

## 2021-10-30 MED ORDER — ONDANSETRON HCL 4 MG/2ML IJ SOLN
4.0000 mg | Freq: Once | INTRAMUSCULAR | Status: AC
  Administered 2021-10-30: 4 mg via INTRAVENOUS

## 2021-10-30 MED ORDER — POTASSIUM CHLORIDE CRYS ER 20 MEQ PO TBCR: 20.0000 meq | EXTENDED_RELEASE_TABLET | Freq: Every day | ORAL | 0 refills | Status: DC

## 2021-10-30 MED ORDER — POTASSIUM CHLORIDE CRYS ER 20 MEQ PO TBCR
40.0000 meq | EXTENDED_RELEASE_TABLET | Freq: Once | ORAL | Status: AC
Start: 2021-10-30 — End: 2021-10-30
  Administered 2021-10-30: 40 meq via ORAL
  Filled 2021-10-30: qty 2

## 2021-10-30 MED ORDER — FENTANYL CITRATE PF 50 MCG/ML IJ SOSY
100.0000 ug | PREFILLED_SYRINGE | Freq: Once | INTRAMUSCULAR | Status: AC
  Administered 2021-10-30: 100 ug via INTRAVENOUS
  Filled 2021-10-30: qty 2

## 2021-10-30 NOTE — ED Provider Notes (Signed)
[facility] EMERGENCY DEPT Provider Note   CSN: [id number] Arrival date & time: 10/30/21  0142     History  Chief Complaint  Patient presents with   Abdominal Pain    [name] is a 51 y.o. female.  The history is provided by the patient.  Abdominal Pain She has history of hypertension, diabetes, hyperlipidemia, tracheostomy, GERD, COPD and she comes in complaining of vomiting and epigastric pain.  She is a chronic pain patient and takes oxycodone 10 mg, and also has ondansetron oral dissolving tablets.  She used both of those without any relief.  Her pain was rated at 10/10, significantly improved since coming in the ED and getting a dose of fentanyl.  She denies hematemesis.  She states that she has been compliant with her pantoprazole.  She does have an appointment with gastroenterology, but not for another 4 weeks.   Home Medications Prior to Admission medications   Medication Sig Start Date End Date Taking? Authorizing Provider  albuterol (PROAIR HFA) 108 (90 Base) MCG/ACT inhaler Inhale 2 puffs into the lungs every 6 (six) hours as needed for wheezing or shortness of breath. 09/24/21   [name], MD  albuterol (PROVENTIL) (2.5 MG/3ML) 0.083% nebulizer solution Take 3 mLs (2.5 mg total) by nebulization every 6 (six) hours as needed for wheezing or shortness of breath. 09/24/21   [name], MD  aspirin EC 81 MG EC tablet Take 1 tablet (81 mg total) by mouth daily. Swallow whole. 08/27/21   Jessy Oto, MD  atorvastatin (LIPITOR) 20 MG tablet Take 1 tablet (20 mg total) by mouth daily. 09/24/21   [name], MD  Blood Glucose Monitoring Suppl (ONETOUCH VERIO) w/Device KIT 1 each by Does not apply route daily. Use to monitor glucose levels daily; E11.9 10/22/21   Renato Shin, MD  buPROPion [facility] SR) 150 MG 12 hr tablet Take 1 tablet (150 mg total) by mouth 2 (two) times daily. 09/24/21   [name], MD  cetirizine (ZYRTEC) 10 MG  tablet Take 1 tablet (10 mg total) by mouth at bedtime. 03/06/21   Spero Geralds, MD  dapagliflozin propanediol (FARXIGA) 5 MG TABS tablet Take 1 tablet (5 mg total) by mouth daily before breakfast. 11/30/20   [name], MD  diclofenac Sodium (VOLTAREN) 1 % GEL Apply 4 g topically 4 (four) times daily. 11/27/20   [name], MD  dicyclomine (BENTYL) 20 MG tablet Take 1 tablet (20 mg total) by mouth 2 (two) times daily. 10/11/21   Prosperi, Christian H, PA-C  docusate sodium (COLACE) 100 MG capsule Take 1 capsule (100 mg total) by mouth 2 (two) times daily. 08/26/21   Jessy Oto, MD  furosemide (LASIX) 20 MG tablet Take 1 tablet (20 mg total) by mouth daily. 10/29/21   [name], MD  gabapentin (NEURONTIN) 300 MG capsule Take 1 capsule (300 mg total) by mouth 2 (two) times daily. 10/18/21   Shary Key, DO  glucose blood (ONETOUCH VERIO) test strip Use to monitor glucose levels daily; E11.9 10/04/20   Renato Shin, MD  hydrOXYzine (ATARAX/VISTARIL) 25 MG tablet Take 1 tablet (25 mg total) by mouth 3 (three) times daily as needed for anxiety. 11/27/20   [name], MD  Insulin Pen Needle 31G X 8 MM MISC Inject 1 Units as directed daily. 07/30/18   [provider]  lisinopril (ZESTRIL) 20 MG tablet Take 1 tablet (20 mg total) by mouth daily. 09/24/21   [name], MD  Melatonin  3 MG TABS Take 3 mg by mouth at bedtime. 10/31/17   [provider]  methocarbamol (ROBAXIN) 750 MG tablet Take 1 tablet (750 mg total) by mouth every 8 (eight) hours as needed for muscle spasms. 09/17/21   [name], MD  Misc. Devices MISC Nebulizer device. Diagnosis - Asthma 07/16/20   [name], MD  Misc. Devices [place] chair 04/03/21   [name], MD  Misc. Devices MISC Bedside commode. Diagnosis- low back pain 09/02/21   [name], MD  Misc. Devices [employer].  Diagnosis-low back pain 10/22/21   [name], MD  mometasone-formoterol (DULERA) 200-5  MCG/ACT AERO Inhale 2 puffs into the lungs in the morning and at bedtime. 07/11/20   [name], MD  ondansetron (ZOFRAN) 4 MG tablet Take 1 tablet (4 mg total) by mouth every 8 (eight) hours as needed. 10/22/21   [name], MD  ondansetron (ZOFRAN-ODT) 4 MG disintegrating tablet 44m ODT q4 hours prn nausea/vomit Patient not taking: Reported on 10/14/2021 10/13/21   FJacqlyn Larsen PA-C  OneTouch Delica Lancets 326SMISC 1 each by Does not apply route daily. Use to monitor glucose levels daily; E11.9 04/14/19   ERenato Shin MD  pantoprazole (PROTONIX) 20 MG tablet Take 1 tablet (20 mg total) by mouth daily. 10/11/21   Prosperi, Christian H, PA-C  pantoprazole (PROTONIX) 40 MG tablet Take 1 tablet (40 mg total) by mouth 2 (two) times daily. Patient taking differently: Take 40 mg by mouth daily as needed (heartburn). 04/30/21   NCharlott Rakes MD  polyethylene glycol (MIRALAX / GLYCOLAX) 17 g packet Take 17 g by mouth daily as needed for mild constipation. 08/26/21   NJessy Oto MD  Semaglutide,0.25 or 0.5MG/DOS, (OZEMPIC, 0.25 OR 0.5 MG/DOSE,) 2 MG/1.5ML SOPN Inject 0.5 mg into the skin once a week. 04/10/21   ERenato Shin MD  sertraline (ZOLOFT) 100 MG tablet Take 1 tablet (100 mg total) by mouth at bedtime. 09/24/21   NCharlott Rakes MD  silver sulfADIAZINE (SILVADENE) 1 % cream Apply 1 application topically daily. 02/28/21   NCharlott Rakes MD  sucralfate (CARAFATE) 1 g tablet Take 1 tablet (1 g total) by mouth 4 (four) times daily -  with meals and at bedtime. 10/13/21   FJacqlyn Larsen PA-C  SUMAtriptan (IMITREX) 100 MG tablet At the onset of a migraine; may repeat in 2 hours. Max daily dose 2091mPatient taking differently: Take 100 mg by mouth every 2 (two) hours as needed for migraine. 09/24/21   NeCharlott RakesMD  topiramate (TOPAMAX) 100 MG tablet Take 1.5 tablets (150 mg total) by mouth 2 (two) times daily. Patient not taking: Reported on 10/14/2021 09/24/21   NeCharlott RakesMD   fluticasone-salmeterol (ADVAIR HFA) 230-21 MCG/ACT inhaler Inhale 2 puffs into the lungs 2 (two) times daily. 10/15/17 10/15/17  NeCharlott RakesMD      Allergies    Reglan [metoclopramide]    Review of Systems   Review of Systems  Gastrointestinal:  Positive for abdominal pain.  All other systems reviewed and are negative.  Physical Exam Updated Vital Signs BP 133/84    Pulse (!) 102    Temp 98.1 F (36.7 C) (Oral)    Resp 11    Ht _0  (1.549 m)    Wt 103.9 kg    LMP  (LMP Unknown)    SpO2 97%    BMI 43.27 kg/m  Physical Exam Vitals and nursing note reviewed.  [age]ear old female, resting comfortably  and in no acute distress. Vital signs are significant for slightly elevated heart rate. Oxygen saturation is 97%, which is normal. Head is normocephalic and atraumatic. PERRLA, EOMI. Oropharynx is clear. Neck is nontender and supple without adenopathy or JVD.  Tracheostomy is in place. Back is nontender and there is no CVA tenderness. Lungs are clear without rales, wheezes, or rhonchi. Chest is nontender. Heart has regular rate and rhythm without murmur. Abdomen is soft, flat, with moderate epigastric tenderness.  There are no masses or hepatosplenomegaly and peristalsis is hypoactive. Extremities have no cyanosis or edema, full range of motion is present. Skin is warm and dry without rash. Neurologic: Mental status is normal, cranial nerves are intact, moves all extremities equally.  ED Results / Procedures / Treatments   Labs (all labs ordered are listed, but only abnormal results are displayed) Labs Reviewed - No data to display  EKG None  Radiology DG Chest 2 View  Result Date: 10/29/2021 CLINICAL DATA:  Chest pain, vomiting EXAM: CHEST - 2 VIEW COMPARISON:  None. FINDINGS: Lungs are clear. No pneumothorax or pleural effusion. Cardiac size within normal limits. Tracheostomy in expected position. Pulmonary vascularity is normal. IMPRESSION: No active cardiopulmonary disease.  Electronically Signed   By: Fidela Salisbury M.D.   On: 10/29/2021 21:47    Procedures Procedures  Per my interpretation, cardiac monitor shows sinus tachycardia.  Medications Ordered in ED Medications  potassium chloride SA (KLOR-CON M) CR tablet 40 mEq (has no administration in time range)  ondansetron (ZOFRAN) injection 4 mg (4 mg Intravenous Not Given 10/30/21 0219)  fentaNYL (SUBLIMAZE) injection 100 mcg (100 mcg Intravenous Given 10/30/21 0239)  lactated ringers bolus 1,000 mL (1,000 mLs Intravenous New Bag/Given 10/30/21 0403)  HYDROmorphone (DILAUDID) injection 1 mg (1 mg Intravenous Given 10/30/21 0405)    ED Course/ Medical Decision Making/ A&P                           Medical Decision Making Amount and/or Complexity of Data Reviewed Labs: ordered.  Risk Prescription drug management.   Recurrent epigastric pain with vomiting.  Old records are reviewed, and she had been admitted to the hospital on 10/14/2021 with similar complaints.  At that time she had hematemesis and had an endoscopy which showed some gastric irritation but otherwise unremarkable.  She has had several upper endoscopies, most of which have been unremarkable.  Labs drawn at [facility] are unremarkable, will repeat CBC to see if there has been any change over the past 6 hours.  Pain is much better following a dose of fentanyl, but that will wear off shortly.  Nausea is improved after IV ondansetron.  She will be given a dose of hydromorphone and will also be given IV fluids.  She feels much better after above-noted treatment.  Labs at [facility] did show hypokalemia and it is noted that she is on a diuretic.  She is given a dose of oral potassium and discharged with prescription for K-Dur as well as prochlorperazine suppository.        Final Clinical Impression(s) / ED Diagnoses Final diagnoses:  Epigastric pain  Nausea and vomiting, unspecified vomiting type  Hypokalemia    Rx / DC  Orders ED Discharge Orders          Ordered    potassium chloride SA (KLOR-CON M) 20 MEQ tablet  Daily        10/30/21 0513    prochlorperazine (  COMPAZINE) 25 MG suppository  Every 12 hours PRN        10/30/21 7182              Delora Fuel, MD 09[birth year]90[birth year]68 [phone]

## 2021-10-30 NOTE — ED Triage Notes (Signed)
Abdominal and chest pain with nausea and vomiting since around 1530 yesterday afternoon. Just left [facility] without being seen due to length of stay.  EKG and labs performed there.

## 2021-10-30 NOTE — ED Notes (Signed)
Patient states her ride is on the way and is requesting IV taken out.

## 2021-10-30 NOTE — ED Notes (Signed)
Patient's trach suctioned with small amount of white secretions returned. Pt's inner cannula in place. Airway patent. SpO2 100%

## 2021-10-31 ENCOUNTER — Emergency Department (HOSPITAL_BASED_OUTPATIENT_CLINIC_OR_DEPARTMENT_OTHER)
Admission: EM | Admit: 2021-10-31 | Discharge: 2021-10-31 | Disposition: A | Discharge status: Home / Self Care | Payer: 59 | Attending: Emergency Medicine | Admitting: Emergency Medicine

## 2021-10-31 ENCOUNTER — Encounter (HOSPITAL_COMMUNITY): Payer: Self-pay

## 2021-10-31 ENCOUNTER — Emergency Department (HOSPITAL_BASED_OUTPATIENT_CLINIC_OR_DEPARTMENT_OTHER)
Admission: EM | Admit: 2021-10-31 | Discharge: 2021-10-31 | Disposition: A | Discharge status: Home / Self Care | Payer: 59 | Source: Home / Self Care | Attending: Emergency Medicine | Admitting: Emergency Medicine

## 2021-10-31 ENCOUNTER — Other Ambulatory Visit: Payer: Self-pay

## 2021-10-31 DIAGNOSIS — R000 Tachycardia, unspecified: Secondary | ICD-10-CM | POA: Insufficient documentation

## 2021-10-31 DIAGNOSIS — I10 Essential (primary) hypertension: Secondary | ICD-10-CM | POA: Insufficient documentation

## 2021-10-31 DIAGNOSIS — R112 Nausea with vomiting, unspecified: Secondary | ICD-10-CM

## 2021-10-31 DIAGNOSIS — K920 Hematemesis: Secondary | ICD-10-CM | POA: Insufficient documentation

## 2021-10-31 DIAGNOSIS — R109 Unspecified abdominal pain: Secondary | ICD-10-CM | POA: Insufficient documentation

## 2021-10-31 DIAGNOSIS — J449 Chronic obstructive pulmonary disease, unspecified: Secondary | ICD-10-CM | POA: Diagnosis not present

## 2021-10-31 DIAGNOSIS — K219 Gastro-esophageal reflux disease without esophagitis: Secondary | ICD-10-CM | POA: Diagnosis not present

## 2021-10-31 DIAGNOSIS — Z794 Long term (current) use of insulin: Secondary | ICD-10-CM | POA: Diagnosis not present

## 2021-10-31 DIAGNOSIS — R079 Chest pain, unspecified: Secondary | ICD-10-CM | POA: Insufficient documentation

## 2021-10-31 DIAGNOSIS — E109 Type 1 diabetes mellitus without complications: Secondary | ICD-10-CM | POA: Insufficient documentation

## 2021-10-31 DIAGNOSIS — Z79899 Other long term (current) drug therapy: Secondary | ICD-10-CM | POA: Diagnosis not present

## 2021-10-31 DIAGNOSIS — R1013 Epigastric pain: Secondary | ICD-10-CM | POA: Diagnosis not present

## 2021-10-31 DIAGNOSIS — J45909 Unspecified asthma, uncomplicated: Secondary | ICD-10-CM | POA: Diagnosis not present

## 2021-10-31 DIAGNOSIS — Z5321 Procedure and treatment not carried out due to patient leaving prior to being seen by health care provider: Secondary | ICD-10-CM | POA: Insufficient documentation

## 2021-10-31 DIAGNOSIS — Z7982 Long term (current) use of aspirin: Secondary | ICD-10-CM | POA: Diagnosis not present

## 2021-10-31 LAB — COMPREHENSIVE METABOLIC PANEL
ALT: 19 U/L (ref 0–44)
AST: 18 U/L (ref 15–41)
Albumin: 4.1 g/dL (ref 3.5–5.0)
Alkaline Phosphatase: 90 U/L (ref 38–126)
Anion gap: 12 (ref 5–15)
BUN: 13 mg/dL (ref 6–20)
CO2: 21 mmol/L — ABNORMAL LOW (ref 22–32)
Calcium: 9.1 mg/dL (ref 8.9–10.3)
Chloride: 107 mmol/L (ref 98–111)
Creatinine, Ser: 1.18 mg/dL — ABNORMAL HIGH (ref 0.44–1.00)
GFR, Estimated: 56 mL/min — ABNORMAL LOW (ref >60)
Glucose, Bld: 141 mg/dL — ABNORMAL HIGH (ref 70–99)
Potassium: 3.3 mmol/L — ABNORMAL LOW (ref 3.5–5.1)
Sodium: 140 mmol/L (ref 135–145)
Total Bilirubin: 0.5 mg/dL (ref 0.3–1.2)
Total Protein: 8 g/dL (ref 6.5–8.1)

## 2021-10-31 LAB — CBC WITH DIFFERENTIAL/PLATELET
Abs Immature Granulocytes: 0.03 10*3/uL (ref 0.00–0.07)
Basophils Absolute: 0 10*3/uL (ref 0.0–0.1)
Basophils Relative: 0 %
Eosinophils Absolute: 0 10*3/uL (ref 0.0–0.5)
Eosinophils Relative: 0 %
HCT: 35.6 % — ABNORMAL LOW (ref 36.0–46.0)
Hemoglobin: 11.5 g/dL — ABNORMAL LOW (ref 12.0–15.0)
Immature Granulocytes: 0 %
Lymphocytes Relative: 28 %
Lymphs Abs: 2.9 10*3/uL (ref 0.7–4.0)
MCH: 27.2 pg (ref 26.0–34.0)
MCHC: 32.3 g/dL (ref 30.0–36.0)
MCV: 84.2 fL (ref 80.0–100.0)
Monocytes Absolute: 0.4 10*3/uL (ref 0.1–1.0)
Monocytes Relative: 4 %
Neutro Abs: 7 10*3/uL (ref 1.7–7.7)
Neutrophils Relative %: 68 %
Platelets: 451 10*3/uL — ABNORMAL HIGH (ref 150–400)
RBC: 4.23 MIL/uL (ref 3.87–5.11)
RDW: 15.9 % — ABNORMAL HIGH (ref 11.5–15.5)
WBC: 10.4 10*3/uL (ref 4.0–10.5)
nRBC: 0 % (ref 0.0–0.2)

## 2021-10-31 LAB — TROPONIN I (HIGH SENSITIVITY): Troponin I (High Sensitivity): 3 ng/L (ref <18)

## 2021-10-31 LAB — LIPASE, BLOOD: Lipase: 40 U/L (ref 11–51)

## 2021-10-31 MED ORDER — FENTANYL CITRATE PF 50 MCG/ML IJ SOSY
50.0000 ug | PREFILLED_SYRINGE | Freq: Once | INTRAMUSCULAR | Status: AC
  Administered 2021-10-31: 50 ug via INTRAVENOUS
  Filled 2021-10-31: qty 1

## 2021-10-31 MED ORDER — HYDROMORPHONE HCL 1 MG/ML IJ SOLN
1.0000 mg | Freq: Once | INTRAMUSCULAR | Status: AC
  Administered 2021-10-31: 1 mg via INTRAMUSCULAR
  Filled 2021-10-31: qty 1

## 2021-10-31 MED ORDER — PROCHLORPERAZINE EDISYLATE 10 MG/2ML IJ SOLN
10.0000 mg | Freq: Once | INTRAMUSCULAR | Status: AC
  Administered 2021-10-31: 10 mg via INTRAVENOUS
  Filled 2021-10-31: qty 2

## 2021-10-31 MED ORDER — ONDANSETRON 4 MG PO TBDP
4.0000 mg | ORAL_TABLET | Freq: Once | ORAL | Status: AC
Start: 2021-10-31 — End: 2021-10-31
  Administered 2021-10-31: 4 mg via ORAL
  Filled 2021-10-31: qty 1

## 2021-10-31 MED ORDER — ONDANSETRON HCL 4 MG/2ML IJ SOLN
4.0000 mg | Freq: Once | INTRAMUSCULAR | Status: AC | PRN
  Administered 2021-10-31: 4 mg via INTRAVENOUS
  Filled 2021-10-31: qty 2

## 2021-10-31 NOTE — ED Notes (Addendum)
This RN assisted pt to lobby in wheelchair.  Pt waiting for uber ride to go home. NAD noted.

## 2021-10-31 NOTE — ED Notes (Signed)
MD notified of pt's increased CP. EKG obtained. Attempting to obtain IV access at this time.

## 2021-10-31 NOTE — ED Notes (Signed)
RT placed pt on ATC RA 5Lpm/21%. Pt respiratory status is stable w/minimal distress. Distress currently r/t pt continually vomiting Per pt. RT will continue to monitor.

## 2021-10-31 NOTE — ED Notes (Signed)
Pt stated that she had an emergency at home and needed to leave.  Provider made aware.  Waiting for discharge paperwork.

## 2021-10-31 NOTE — ED Triage Notes (Signed)
Pt presents via EMS from home with c/o abdominal pain present x one month. Pt has been seen several times for same, GI appt later in March, meds prescribed from here have not been working. Pt does have a trach.

## 2021-10-31 NOTE — ED Notes (Signed)
Pt taken off of ATC w/sats of 100% for pt to leave AMA d/t home emergency. RT assessed pt her respiratory status is stable at this time w/no distress noted. Pt states she is breathing normally now.

## 2021-10-31 NOTE — ED Provider Triage Note (Signed)
Emergency Medicine Provider Triage Evaluation Note  Janiene Aarons [name] , a 51 y.o. female  was evaluated in triage.  Pt complains of abdominal pain since last night.  Patient has history of chronic abdominal pain, recently admitted and discharged from facility on 1/27.  Patient reporting slight amount of blood in vomit.  Patient states she has vomited "6-7 times".  Review of Systems  Positive: Blood in vomit, abdominal pain, nausea, vomiting Negative: Fever, constipation, chest pain, shortness of breath  Physical Exam  BP (!) 174/108 (BP Location: Left Arm)    Pulse (!) 114    Temp 98.9 F (37.2 C) (Oral)    Resp 18    LMP  (LMP Unknown)    SpO2 98%  Gen:   Awake, no distress   Resp:  Normal effort  MSK:   Moves extremities without difficulty  Other:  Diffuse abdominal pain  Medical Decision Making  Medically screening exam initiated at 2:12 PM.  Appropriate orders placed.  Deretha Mae [name] was informed that the remainder of the evaluation will be completed by another provider, this initial triage assessment does not replace that evaluation, and the importance of remaining in the ED until their evaluation is complete.     Azucena Cecil, PA-C 10/31/21 1413

## 2021-10-31 NOTE — ED Provider Notes (Signed)
[facility] EMERGENCY DEPT Provider Note   CSN: [id number] Arrival date & time: 10/31/21  1805     History  Chief Complaint  Patient presents with   Chest Pain   Abdominal Pain    [name] is a 51 y.o. female.   Chest Pain Associated symptoms: abdominal pain, nausea and vomiting   Abdominal Pain Associated symptoms: chest pain, nausea and vomiting   Patient presents chest pain abdominal pain nausea and vomiting.  Chronic trach.  History of same.  Has had 10 visits over the last month to the ER for similar symptoms.  History of gastroparesis.  Reportedly has been vomiting now.  Reported had been seen yesterday here and then went to [facility] earlier today.  Was not seen however.   Past Medical History:  Diagnosis Date   Allergy    Anxiety    Arthritis    Asthma    Chest pain of uncertain etiology 1/95/0932   Chronic back pain    Chronic chest pain    resolved, no problems since 2019 per patient 07/27/19   COPD (chronic obstructive pulmonary disease) ([place])    Depression    DM (diabetes mellitus) ([place])    INSULIN DEPENDENT - TYPE 1   Dyspnea    GERD (gastroesophageal reflux disease)    Headache(784.0)    History of hiatal hernia    Hyperlipidemia    no med, diet controlled   Hypertension    Hypokalemia    Pneumonia    Respiratory disease 05/2017   uses inhalers, neb tx prn, no oxygen   Sleep apnea    does not use CPAP due to trach   Status post tracheostomy ([place]) 03/14/2017   Last Assessment & Plan:  Formatting of this note might be different from the original. - [place] - On tach - ENT following along (please see above for current trach management plan)   Tracheostomy in place [facility]) 02/2017   Past Surgical History:  Procedure Laterality Date   ABDOMINAL HYSTERECTOMY  2009   APPENDECTOMY     BIOPSY  10/15/2021   Procedure: BIOPSY;  Surgeon: Thornton Park, MD;  Location: [facility];   Service: Gastroenterology;;   CESAREAN SECTION     x 3   CHOLECYSTECTOMY N/A 03/02/2014   Procedure: LAPAROSCOPIC CHOLECYSTECTOMY;  Surgeon: Joyice Faster. Cornett, MD;  Location: [facility];  Service: General;  Laterality: N/A;   ESOPHAGOGASTRODUODENOSCOPY (EGD) WITH PROPOFOL N/A 10/15/2021   Procedure: ESOPHAGOGASTRODUODENOSCOPY (EGD) WITH PROPOFOL;  Surgeon: Thornton Park, MD;  Location: [facility];  Service: Gastroenterology;  Laterality: N/A;   HERNIA REPAIR     PANENDOSCOPY N/A 03/04/2017   Procedure: PANENDOSCOPY WITH POSSIBLE FOREIGN BODY REMOVAL;  Surgeon: Jodi Marble, MD;  Location: [facility];  Service: ENT;  Laterality: N/A;   ROTATOR CUFF REPAIR Left    ROTATOR CUFF REPAIR Right 07/27/2019   SHOULDER ARTHROSCOPY WITH SUBACROMIAL DECOMPRESSION, ROTATOR CUFF REPAIR AND BICEP TENDON REPAIR Right 07/28/2019   Procedure: RIGHT SHOULDER ARTHROSCOP, MINI OPEN ROTATOR CUFF TEAR REPAIR,  BICEPS TENODESIS, DISTAL CLAVICLE EXCISION;  Surgeon: Meredith Pel, MD;  Location: [facility];  Service: Orthopedics;  Laterality: Right;   TOOTH EXTRACTION N/A 02/22/2021   Procedure: DENTAL RESTORATION/EXTRACTIONS;  Surgeon: Diona Browner, DMD;  Location: [facility];  Service: Oral Surgery;  Laterality: N/A;   TRACHEOSTOMY  02/2017   TUBAL LIGATION     VESICOVAGINAL FISTULA CLOSURE W/ TAH  2009    Home Medications Prior to  Admission medications   Medication Sig Start Date End Date Taking? Authorizing Provider  albuterol (PROAIR HFA) 108 (90 Base) MCG/ACT inhaler Inhale 2 puffs into the lungs every 6 (six) hours as needed for wheezing or shortness of breath. 09/24/21   [name], MD  albuterol (PROVENTIL) (2.5 MG/3ML) 0.083% nebulizer solution Take 3 mLs (2.5 mg total) by nebulization every 6 (six) hours as needed for wheezing or shortness of breath. 09/24/21   [name], MD  aspirin EC 81 MG EC tablet Take 1 tablet (81 mg total) by mouth daily. Swallow whole. 08/27/21   Jessy Oto, MD  atorvastatin  (LIPITOR) 20 MG tablet Take 1 tablet (20 mg total) by mouth daily. 09/24/21   [name], MD  Blood Glucose Monitoring Suppl (ONETOUCH VERIO) w/Device KIT 1 each by Does not apply route daily. Use to monitor glucose levels daily; E11.9 10/22/21   Renato Shin, MD  buPROPion [facility] SR) 150 MG 12 hr tablet Take 1 tablet (150 mg total) by mouth 2 (two) times daily. 09/24/21   [name], MD  cetirizine (ZYRTEC) 10 MG tablet Take 1 tablet (10 mg total) by mouth at bedtime. 03/06/21   Spero Geralds, MD  dapagliflozin propanediol (FARXIGA) 5 MG TABS tablet Take 1 tablet (5 mg total) by mouth daily before breakfast. 11/30/20   [name], MD  diclofenac Sodium (VOLTAREN) 1 % GEL Apply 4 g topically 4 (four) times daily. 11/27/20   [name], MD  dicyclomine (BENTYL) 20 MG tablet Take 1 tablet (20 mg total) by mouth 2 (two) times daily. 10/11/21   Prosperi, Christian H, PA-C  docusate sodium (COLACE) 100 MG capsule Take 1 capsule (100 mg total) by mouth 2 (two) times daily. 08/26/21   Jessy Oto, MD  furosemide (LASIX) 20 MG tablet Take 1 tablet (20 mg total) by mouth daily. 10/29/21   [name], MD  gabapentin (NEURONTIN) 300 MG capsule Take 1 capsule (300 mg total) by mouth 2 (two) times daily. 10/18/21   Shary Key, DO  glucose blood (ONETOUCH VERIO) test strip Use to monitor glucose levels daily; E11.9 10/04/20   Renato Shin, MD  hydrOXYzine (ATARAX/VISTARIL) 25 MG tablet Take 1 tablet (25 mg total) by mouth 3 (three) times daily as needed for anxiety. 11/27/20   [name], MD  Insulin Pen Needle 31G X 8 MM MISC Inject 1 Units as directed daily. 07/30/18   [provider]  lisinopril (ZESTRIL) 20 MG tablet Take 1 tablet (20 mg total) by mouth daily. 09/24/21   [name], MD  Melatonin 3 MG TABS Take 3 mg by mouth at bedtime. 10/31/17   [provider]  methocarbamol (ROBAXIN) 750 MG tablet Take 1 tablet (750 mg total) by mouth every 8 (eight) hours  as needed for muscle spasms. 09/17/21   [name], MD  Misc. Devices MISC Nebulizer device. Diagnosis - Asthma 07/16/20   [name], MD  Misc. Devices [employer] chair 04/03/21   [name], MD  Misc. Devices MISC Bedside commode. Diagnosis- low back pain 09/02/21   [name], MD  Misc. Devices [employer].  Diagnosis-low back pain 10/22/21   [name], MD  mometasone-formoterol (DULERA) 200-5 MCG/ACT AERO Inhale 2 puffs into the lungs in the morning and at bedtime. 07/11/20   [name], MD  ondansetron (ZOFRAN) 4 MG tablet Take 1 tablet (4 mg total) by mouth every 8 (eight) hours as needed. 10/22/21   [name], MD  ondansetron (ZOFRAN-ODT) 4  MG disintegrating tablet 72m ODT q4 hours prn nausea/vomit Patient not taking: Reported on 10/14/2021 10/13/21   FJacqlyn Larsen PA-C  OneTouch Delica Lancets 341DMISC 1 each by Does not apply route daily. Use to monitor glucose levels daily; E11.9 04/14/19   ERenato Shin MD  pantoprazole (PROTONIX) 20 MG tablet Take 1 tablet (20 mg total) by mouth daily. 10/11/21   Prosperi, Christian H, PA-C  pantoprazole (PROTONIX) 40 MG tablet Take 1 tablet (40 mg total) by mouth 2 (two) times daily. Patient taking differently: Take 40 mg by mouth daily as needed (heartburn). 04/30/21   NCharlott Rakes MD  polyethylene glycol (MIRALAX / GLYCOLAX) 17 g packet Take 17 g by mouth daily as needed for mild constipation. 08/26/21   NJessy Oto MD  potassium chloride SA (KLOR-CON M) 20 MEQ tablet Take 1 tablet (20 mEq total) by mouth daily. 26/2/22  GDelora Fuel MD  prochlorperazine (COMPAZINE) 25 MG suppository Place 1 suppository (25 mg total) rectally every 12 (twelve) hours as needed for nausea or vomiting. 29/7/98  GDelora Fuel MD  SXQJJHERDEYC,1.44or 0.5MG/DOS, (OZEMPIC, 0.25 OR 0.5 MG/DOSE,) 2 MG/1.5ML SOPN Inject 0.5 mg into the skin once a week. 04/10/21   ERenato Shin MD  sertraline (ZOLOFT) 100 MG tablet Take 1 tablet (100 mg  total) by mouth at bedtime. 09/24/21   NCharlott Rakes MD  silver sulfADIAZINE (SILVADENE) 1 % cream Apply 1 application topically daily. 02/28/21   NCharlott Rakes MD  sucralfate (CARAFATE) 1 g tablet Take 1 tablet (1 g total) by mouth 4 (four) times daily -  with meals and at bedtime. 10/13/21   FJacqlyn Larsen PA-C  SUMAtriptan (IMITREX) 100 MG tablet At the onset of a migraine; may repeat in 2 hours. Max daily dose 2032mPatient taking differently: Take 100 mg by mouth every 2 (two) hours as needed for migraine. 09/24/21   NeCharlott RakesMD  topiramate (TOPAMAX) 100 MG tablet Take 1.5 tablets (150 mg total) by mouth 2 (two) times daily. Patient not taking: Reported on 10/14/2021 09/24/21   NeCharlott RakesMD  fluticasone-salmeterol (ADVAIR HFA) 230-21 MCG/ACT inhaler Inhale 2 puffs into the lungs 2 (two) times daily. 10/15/17 10/15/17  NeCharlott RakesMD      Allergies    Reglan [metoclopramide]    Review of Systems   Review of Systems  Cardiovascular:  Positive for chest pain.  Gastrointestinal:  Positive for abdominal pain, nausea and vomiting.   Physical Exam Updated Vital Signs BP (!) 134/101    Pulse (!) 116    Temp 99.8 F (37.7 C) (Oral)    Resp 15    LMP  (LMP Unknown)    SpO2 100%  Physical Exam Vitals reviewed.  Constitutional:      Appearance: She is obese.  Neck:     Comments: Trach in place Cardiovascular:     Rate and Rhythm: Tachycardia present.  Pulmonary:     Breath sounds: No wheezing.  Abdominal:     Tenderness: There is abdominal tenderness.     Comments: Epigastric tenderness without mass.  Musculoskeletal:     Cervical back: Neck supple.  Skin:    General: Skin is warm.  Neurological:     Mental Status: She is alert.    ED Results / Procedures / Treatments   Labs (all labs ordered are listed, but only abnormal results are displayed) Labs Reviewed  TROPONIN I (HIGH SENSITIVITY)    EKG None  Radiology DG Chest 2  View  Result Date:  10/29/2021 CLINICAL DATA:  Chest pain, vomiting EXAM: CHEST - 2 VIEW COMPARISON:  None. FINDINGS: Lungs are clear. No pneumothorax or pleural effusion. Cardiac size within normal limits. Tracheostomy in expected position. Pulmonary vascularity is normal. IMPRESSION: No active cardiopulmonary disease. Electronically Signed   By: Fidela Salisbury M.D.   On: 10/29/2021 21:47    Procedures Procedures    Medications Ordered in ED Medications  ondansetron [facility]) injection 4 mg (4 mg Intravenous Given 10/31/21 2002)  prochlorperazine (COMPAZINE) injection 10 mg (10 mg Intravenous Given 10/31/21 2006)  fentaNYL (SUBLIMAZE) injection 50 mcg (50 mcg Intravenous Given 10/31/21 2006)    ED Course/ Medical Decision Making/ A&P                           Medical Decision Making Problems Addressed: Nausea and vomiting, unspecified vomiting type: chronic illness or injury  Risk Prescription drug management.   Patient with nausea vomiting chest and abdominal pain.  History of same.  This is her 10th visit for the same in the last month.  Reviewed lab work from earlier today.  Reviewed recent discharge note also.  Initial differential diagnosis is broad and includes gastroparesis, acute coronary disease, bowel obstruction.  Has chronic trach.  Had been given Compazine fentanyl and was waiting for reevaluation.  Patient stated that she got a call in her paralyzed daughter fell and needed help at home.  Patient left.  With labs earlier today does not appear like she was overly dehydrated although would have liked more time to reevaluate the treatment.  Discharged. outpatient follow-up as needed.        Final Clinical Impression(s) / ED Diagnoses Final diagnoses:  Nausea and vomiting, unspecified vomiting type    Rx / DC Orders ED Discharge Orders     None         Davonna Belling, MD 10/31/21 2111

## 2021-10-31 NOTE — ED Triage Notes (Addendum)
Pt presents from [facility] ED with c/o abdominal pain and chest pain since 11:00 am.  Pt has trach.

## 2021-11-01 ENCOUNTER — Ambulatory Visit: Payer: Self-pay

## 2021-11-01 ENCOUNTER — Emergency Department (HOSPITAL_COMMUNITY)
Admission: EM | Admit: 2021-11-01 | Discharge: 2021-11-01 | Disposition: A | Discharge status: Home / Self Care | Payer: 59 | Attending: Emergency Medicine | Admitting: Emergency Medicine

## 2021-11-01 ENCOUNTER — Other Ambulatory Visit: Payer: Self-pay

## 2021-11-01 ENCOUNTER — Encounter (HOSPITAL_COMMUNITY): Payer: Self-pay

## 2021-11-01 DIAGNOSIS — Z7982 Long term (current) use of aspirin: Secondary | ICD-10-CM | POA: Insufficient documentation

## 2021-11-01 DIAGNOSIS — E876 Hypokalemia: Secondary | ICD-10-CM | POA: Insufficient documentation

## 2021-11-01 DIAGNOSIS — D649 Anemia, unspecified: Secondary | ICD-10-CM | POA: Diagnosis not present

## 2021-11-01 DIAGNOSIS — R1010 Upper abdominal pain, unspecified: Secondary | ICD-10-CM

## 2021-11-01 DIAGNOSIS — R600 Localized edema: Secondary | ICD-10-CM | POA: Insufficient documentation

## 2021-11-01 DIAGNOSIS — R7309 Other abnormal glucose: Secondary | ICD-10-CM | POA: Diagnosis not present

## 2021-11-01 DIAGNOSIS — R112 Nausea with vomiting, unspecified: Secondary | ICD-10-CM | POA: Insufficient documentation

## 2021-11-01 DIAGNOSIS — Z794 Long term (current) use of insulin: Secondary | ICD-10-CM | POA: Insufficient documentation

## 2021-11-01 DIAGNOSIS — Z79899 Other long term (current) drug therapy: Secondary | ICD-10-CM | POA: Diagnosis not present

## 2021-11-01 DIAGNOSIS — R1013 Epigastric pain: Secondary | ICD-10-CM | POA: Insufficient documentation

## 2021-11-01 LAB — CBC WITH DIFFERENTIAL/PLATELET
Abs Immature Granulocytes: 0.01 10*3/uL (ref 0.00–0.07)
Basophils Absolute: 0 10*3/uL (ref 0.0–0.1)
Basophils Relative: 0 %
Eosinophils Absolute: 0 10*3/uL (ref 0.0–0.5)
Eosinophils Relative: 0 %
HCT: 35.5 % — ABNORMAL LOW (ref 36.0–46.0)
Hemoglobin: 11.5 g/dL — ABNORMAL LOW (ref 12.0–15.0)
Immature Granulocytes: 0 %
Lymphocytes Relative: 24 %
Lymphs Abs: 2 10*3/uL (ref 0.7–4.0)
MCH: 27.3 pg (ref 26.0–34.0)
MCHC: 32.4 g/dL (ref 30.0–36.0)
MCV: 84.1 fL (ref 80.0–100.0)
Monocytes Absolute: 0.4 10*3/uL (ref 0.1–1.0)
Monocytes Relative: 5 %
Neutro Abs: 5.7 10*3/uL (ref 1.7–7.7)
Neutrophils Relative %: 71 %
Platelets: 451 10*3/uL — ABNORMAL HIGH (ref 150–400)
RBC: 4.22 MIL/uL (ref 3.87–5.11)
RDW: 15.7 % — ABNORMAL HIGH (ref 11.5–15.5)
WBC: 8.2 10*3/uL (ref 4.0–10.5)
nRBC: 0 % (ref 0.0–0.2)

## 2021-11-01 LAB — COMPREHENSIVE METABOLIC PANEL
ALT: 18 U/L (ref 0–44)
AST: 24 U/L (ref 15–41)
Albumin: 4 g/dL (ref 3.5–5.0)
Alkaline Phosphatase: 94 U/L (ref 38–126)
Anion gap: 12 (ref 5–15)
BUN: 14 mg/dL (ref 6–20)
CO2: 21 mmol/L — ABNORMAL LOW (ref 22–32)
Calcium: 8.9 mg/dL (ref 8.9–10.3)
Chloride: 106 mmol/L (ref 98–111)
Creatinine, Ser: 1 mg/dL (ref 0.44–1.00)
GFR, Estimated: >60 mL/min (ref >60)
Glucose, Bld: 153 mg/dL — ABNORMAL HIGH (ref 70–99)
Potassium: 3.3 mmol/L — ABNORMAL LOW (ref 3.5–5.1)
Sodium: 139 mmol/L (ref 135–145)
Total Bilirubin: 1.1 mg/dL (ref 0.3–1.2)
Total Protein: 8.1 g/dL (ref 6.5–8.1)

## 2021-11-01 LAB — ETHANOL: Alcohol, Ethyl (B): <10 mg/dL (ref <10)

## 2021-11-01 LAB — LIPASE, BLOOD: Lipase: 35 U/L (ref 11–51)

## 2021-11-01 MED ORDER — SODIUM CHLORIDE 0.9 % IV BOLUS
500.0000 mL | Freq: Once | INTRAVENOUS | Status: AC
  Administered 2021-11-01: 500 mL via INTRAVENOUS

## 2021-11-01 MED ORDER — HALOPERIDOL LACTATE 5 MG/ML IJ SOLN
5.0000 mg | Freq: Once | INTRAMUSCULAR | Status: AC
Start: 2021-11-01 — End: 2021-11-01
  Administered 2021-11-01: 5 mg via INTRAVENOUS
  Filled 2021-11-01: qty 1

## 2021-11-01 MED ORDER — POTASSIUM CHLORIDE CRYS ER 20 MEQ PO TBCR: 20.0000 meq | EXTENDED_RELEASE_TABLET | Freq: Every day | ORAL | 0 refills | Status: DC

## 2021-11-01 MED ORDER — LORAZEPAM 2 MG/ML IJ SOLN
1.0000 mg | Freq: Once | INTRAMUSCULAR | Status: AC
  Administered 2021-11-01: 1 mg via INTRAVENOUS
  Filled 2021-11-01: qty 1

## 2021-11-01 MED ORDER — ONDANSETRON HCL 8 MG PO TABS: 8.0000 mg | ORAL_TABLET | Freq: Three times a day (TID) | ORAL | 0 refills | Status: DC | PRN

## 2021-11-01 MED ORDER — FENTANYL CITRATE PF 50 MCG/ML IJ SOSY
100.0000 ug | PREFILLED_SYRINGE | Freq: Once | INTRAMUSCULAR | Status: AC
  Administered 2021-11-01: 100 ug via INTRAVENOUS
  Filled 2021-11-01: qty 2

## 2021-11-01 NOTE — Telephone Encounter (Signed)
° ° °  Chief Complaint: Vomiting x 1 month Symptoms: Vomiting, abdominal pain Frequency: Started 1 month ago. Has been to ED 10 x for this. Pertinent Negatives: Patient denies fever Disposition: [] ED /[] Urgent Care (no appt availability in office) / [] Appointment(In office/virtual)/ []  [facility]/ [] Home Care/ [] Refused Recommended Disposition /[] [place] [facility]/ [x]  Follow-up with PCP Additional Notes: Pt. Requesting to be seen sooner than  11/14/21. Would like to be seen as soon as possible. Answer Assessment - Initial Assessment Questions 1. VOMITING SEVERITY: "How many times have you vomited in the past 24 hours?"     - MILD:  1 - 2 times/day    - MODERATE: 3 - 5 times/day, decreased oral intake without significant weight loss or symptoms of dehydration    - SEVERE: 6 or more times/day, vomits everything or nearly everything, with significant weight loss, symptoms of dehydration      Severe 2. ONSET: "When did the vomiting begin?"      1 month ago 3. FLUIDS: "What fluids or food have you vomited up today?" "Have you been able to keep any fluids down?"     No 4. ABDOMINAL PAIN: "Are your having any abdominal pain?" If yes : "How bad is it and what does it feel like?" (e.g., crampy, dull, intermittent, constant)      10 5. DIARRHEA: "Is there any diarrhea?" If Yes, ask: "How many times today?"      Yes 6. CONTACTS: "Is there anyone else in the family with the same symptoms?"      No 7. CAUSE: "What do you think is causing your vomiting?"     Unsure 8. HYDRATION STATUS: "Any signs of dehydration?" (e.g., dry mouth [not only dry lips], too weak to stand) "When did you last urinate?"     No 9. OTHER SYMPTOMS: "Do you have any other symptoms?" (e.g., fever, headache, vertigo, vomiting blood or coffee grounds, recent head injury)     No 10. PREGNANCY: "Is there any chance you are pregnant?" "When was your last menstrual period?"       No  Protocols used:  Vomiting-A-AH

## 2021-11-01 NOTE — Telephone Encounter (Signed)
FYI patient has an appointment on 11/14/21

## 2021-11-01 NOTE — ED Provider Notes (Signed)
[facility] DEPT Provider Note   CSN: [id number] Arrival date & time: 11/01/21  1606     History  No chief complaint on file.   [name] is a 51 y.o. female.  HPI She presents for evaluation of ongoing abdominal pain for 4 weeks despite multiple ED evaluations, hospitalizations, and endoscopy.  She has chronic pain and takes oxycodone every 4 hours.  Her MME is 55.  She reports persistent vomiting and denies hematemesis.  Has been no fever.  Her last encounter was yesterday evening at the freestanding ED.  She presents BMS today    Home Medications Prior to Admission medications   Medication Sig Start Date End Date Taking? Authorizing Provider  ondansetron (ZOFRAN) 8 MG tablet Take 1 tablet (8 mg total) by mouth every 8 (eight) hours as needed for nausea or vomiting. 11/01/21  Yes Daleen Bo, MD  potassium chloride SA (KLOR-CON M) 20 MEQ tablet Take 1 tablet (20 mEq total) by mouth daily. 11/01/21  Yes Daleen Bo, MD  albuterol (PROAIR HFA) 108 (90 Base) MCG/ACT inhaler Inhale 2 puffs into the lungs every 6 (six) hours as needed for wheezing or shortness of breath. 09/24/21   [name], MD  albuterol (PROVENTIL) (2.5 MG/3ML) 0.083% nebulizer solution Take 3 mLs (2.5 mg total) by nebulization every 6 (six) hours as needed for wheezing or shortness of breath. 09/24/21   [name], MD  aspirin EC 81 MG EC tablet Take 1 tablet (81 mg total) by mouth daily. Swallow whole. 08/27/21   Jessy Oto, MD  atorvastatin (LIPITOR) 20 MG tablet Take 1 tablet (20 mg total) by mouth daily. 09/24/21   [name], MD  Blood Glucose Monitoring Suppl (ONETOUCH VERIO) w/Device KIT 1 each by Does not apply route daily. Use to monitor glucose levels daily; E11.9 10/22/21   Renato Shin, MD  buPROPion [facility] SR) 150 MG 12 hr tablet Take 1 tablet (150 mg total) by mouth 2 (two) times daily. 09/24/21   [name], MD  cetirizine (ZYRTEC)  10 MG tablet Take 1 tablet (10 mg total) by mouth at bedtime. 03/06/21   Spero Geralds, MD  dapagliflozin propanediol (FARXIGA) 5 MG TABS tablet Take 1 tablet (5 mg total) by mouth daily before breakfast. 11/30/20   [name], MD  diclofenac Sodium (VOLTAREN) 1 % GEL Apply 4 g topically 4 (four) times daily. 11/27/20   [name], MD  dicyclomine (BENTYL) 20 MG tablet Take 1 tablet (20 mg total) by mouth 2 (two) times daily. 10/11/21   Prosperi, Christian H, PA-C  docusate sodium (COLACE) 100 MG capsule Take 1 capsule (100 mg total) by mouth 2 (two) times daily. 08/26/21   Jessy Oto, MD  furosemide (LASIX) 20 MG tablet Take 1 tablet (20 mg total) by mouth daily. 10/29/21   [name], MD  gabapentin (NEURONTIN) 300 MG capsule Take 1 capsule (300 mg total) by mouth 2 (two) times daily. 10/18/21   Shary Key, DO  glucose blood (ONETOUCH VERIO) test strip Use to monitor glucose levels daily; E11.9 10/04/20   Renato Shin, MD  hydrOXYzine (ATARAX/VISTARIL) 25 MG tablet Take 1 tablet (25 mg total) by mouth 3 (three) times daily as needed for anxiety. 11/27/20   [name], MD  Insulin Pen Needle 31G X 8 MM MISC Inject 1 Units as directed daily. 07/30/18   [provider]  lisinopril (ZESTRIL) 20 MG tablet Take 1 tablet (20 mg total) by mouth daily. 09/24/21  [name], MD  Melatonin 3 MG TABS Take 3 mg by mouth at bedtime. 10/31/17   [provider]  methocarbamol (ROBAXIN) 750 MG tablet Take 1 tablet (750 mg total) by mouth every 8 (eight) hours as needed for muscle spasms. 09/17/21   [name], MD  Misc. Devices MISC Nebulizer device. Diagnosis - Asthma 07/16/20   [name], MD  Misc. Devices [place] chair 04/03/21   [name], MD  Misc. Devices MISC Bedside commode. Diagnosis- low back pain 09/02/21   [name], MD  Misc. Devices [employer].  Diagnosis-low back pain 10/22/21   [name], MD  mometasone-formoterol (DULERA) 200-5  MCG/ACT AERO Inhale 2 puffs into the lungs in the morning and at bedtime. 07/11/20   [name], MD  ondansetron (ZOFRAN-ODT) 4 MG disintegrating tablet 82m ODT q4 hours prn nausea/vomit Patient not taking: Reported on 10/14/2021 10/13/21   FJacqlyn Larsen PA-C  OneTouch Delica Lancets [id number] 1 each by Does not apply route daily. Use to monitor glucose levels daily; E11.9 04/14/19   ERenato Shin MD  pantoprazole (PROTONIX) 20 MG tablet Take 1 tablet (20 mg total) by mouth daily. 10/11/21   Prosperi, Christian H, PA-C  pantoprazole (PROTONIX) 40 MG tablet Take 1 tablet (40 mg total) by mouth 2 (two) times daily. Patient taking differently: Take 40 mg by mouth daily as needed (heartburn). 04/30/21   NCharlott Rakes MD  polyethylene glycol (MIRALAX / GLYCOLAX) 17 g packet Take 17 g by mouth daily as needed for mild constipation. 08/26/21   NJessy Oto MD  prochlorperazine (COMPAZINE) 25 MG suppository Place 1 suppository (25 mg total) rectally every 12 (twelve) hours as needed for nausea or vomiting. 21/0/17  GDelora Fuel MD  SPZWCHENIDPO,2.42or 0.5MG/DOS, (OZEMPIC, 0.25 OR 0.5 MG/DOSE,) 2 MG/1.5ML SOPN Inject 0.5 mg into the skin once a week. 04/10/21   ERenato Shin MD  sertraline (ZOLOFT) 100 MG tablet Take 1 tablet (100 mg total) by mouth at bedtime. 09/24/21   NCharlott Rakes MD  silver sulfADIAZINE (SILVADENE) 1 % cream Apply 1 application topically daily. 02/28/21   NCharlott Rakes MD  sucralfate (CARAFATE) 1 g tablet Take 1 tablet (1 g total) by mouth 4 (four) times daily -  with meals and at bedtime. 10/13/21   FJacqlyn Larsen PA-C  SUMAtriptan (IMITREX) 100 MG tablet At the onset of a migraine; may repeat in 2 hours. Max daily dose 2076mPatient taking differently: Take 100 mg by mouth every 2 (two) hours as needed for migraine. 09/24/21   NeCharlott RakesMD  topiramate (TOPAMAX) 100 MG tablet Take 1.5 tablets (150 mg total) by mouth 2 (two) times daily. Patient not taking: Reported on  10/14/2021 09/24/21   NeCharlott RakesMD  fluticasone-salmeterol (ADVAIR HFA) 230-21 MCG/ACT inhaler Inhale 2 puffs into the lungs 2 (two) times daily. 10/15/17 10/15/17  NeCharlott RakesMD      Allergies    Reglan [metoclopramide]    Review of Systems   Review of Systems  Physical Exam Updated Vital Signs BP (!) 147/99    Pulse (!) 111    Temp 99.2 F (37.3 C) (Oral)    Resp (!) 24    Ht 5' 1"  (1.549 m)    Wt 103.9 kg    LMP  (LMP Unknown)    SpO2 99%    BMI 43.27 kg/m  Physical Exam Vitals and nursing note reviewed.  Constitutional:      General: She is not in  acute distress.    Appearance: She is well-developed. She is obese. She is not ill-appearing or diaphoretic.     Comments: Crying  HENT:     Head: Normocephalic and atraumatic.     Right Ear: External ear normal.     Left Ear: External ear normal.     Mouth/Throat:     Pharynx: No oropharyngeal exudate or posterior oropharyngeal erythema.  Eyes:     Conjunctiva/sclera: Conjunctivae normal.     Pupils: Pupils are equal, round, and reactive to light.  Neck:     Trachea: Phonation normal.     Comments: Speaks by putting her finger over her tracheostomy device. Cardiovascular:     Rate and Rhythm: Normal rate and regular rhythm.     Heart sounds: Normal heart sounds.  Pulmonary:     Effort: Pulmonary effort is normal. No respiratory distress.     Breath sounds: Normal breath sounds. No stridor.  Abdominal:     General: There is no distension.     Palpations: Abdomen is soft.     Tenderness: There is abdominal tenderness (Epigastric, mild).  Musculoskeletal:        General: Normal range of motion.     Cervical back: Normal range of motion and neck supple.     Right lower leg: Edema present.     Left lower leg: Edema present.     Comments: Mild lower leg edema bilaterally  Skin:    General: Skin is warm and dry.  Neurological:     Mental Status: She is alert and oriented to person, place, and time.     Cranial  Nerves: No cranial nerve deficit.     Sensory: No sensory deficit.     Motor: No abnormal muscle tone.     Coordination: Coordination normal.  Psychiatric:        Mood and Affect: Mood normal.        Behavior: Behavior normal.        Thought Content: Thought content normal.        Judgment: Judgment normal.    ED Results / Procedures / Treatments   Labs (all labs ordered are listed, but only abnormal results are displayed) Labs Reviewed  COMPREHENSIVE METABOLIC PANEL - Abnormal; Notable for the following components:      Result Value   Potassium 3.3 (*)    CO2 21 (*)    Glucose, Bld 153 (*)    All other components within normal limits  CBC WITH DIFFERENTIAL/PLATELET - Abnormal; Notable for the following components:   Hemoglobin 11.5 (*)    HCT 35.5 (*)    RDW 15.7 (*)    Platelets 451 (*)    All other components within normal limits  LIPASE, BLOOD  ETHANOL    EKG None  Radiology No results found.  Procedures Procedures    Medications Ordered in ED Medications  sodium chloride 0.9 % bolus 500 mL (0 mLs Intravenous Stopped 11/01/21 1839)  haloperidol lactate (HALDOL) injection 5 mg (5 mg Intravenous Given 11/01/21 1716)  LORazepam (ATIVAN) injection 1 mg (1 mg Intravenous Given 11/01/21 1716)  fentaNYL (SUBLIMAZE) injection 100 mcg (100 mcg Intravenous Given 11/01/21 1717)    ED Course/ Medical Decision Making/ A&P Clinical Course as of 11/04/21 1242  Fri Nov 01, 2021  1949 She is calm and comfortable now.  She is no longer crying.  She has been ambulatory to the bathroom and back easily.  Findings discussed with her and all questions were  answered. [EW]    Clinical Course User Index [EW] Daleen Bo, MD                           Medical Decision Making Patient presenting for persistent and recurrent abdominal pain despite taking chronic pain medicines daily and having multiple evaluations by primary care, emergency services and gastroenterology.  Problems  Addressed: Nausea and vomiting, unspecified vomiting type: chronic illness or injury Pain of upper abdomen: chronic illness or injury  Amount and/or Complexity of Data Reviewed Independent Historian:     Details: She gives a cogent history. External Data Reviewed:     Details: EMS treatment on the way in today, IV fentanyl and Zofran.  Prior hospitalization, several weeks ago and endoscopy notes reviewed in the EMR.  On the prior hospitalization, her abdominal pain and hematemesis was attributed to gastritis.  Pertinent prior laboratory evaluations include low hemoglobin and potassium, yesterday Labs: ordered.    Details: CBC, metabolic panel, lipase, drug screen, alcohol level-the findings are normal except potassium slightly low, glucose high, hemoglobin low.  These abnormalities did not require intervention in the ED. Radiology:     Details: Radiographic imaging not required ECG/medicine tests: ordered.    Details: Cardiac monitor  Risk Prescription drug management. Decision regarding hospitalization. Risk Details: The patient presents once again for recurrent symptoms, despite having multiple similar complaints and evaluations.  She was treated symptomatically with improvement.  She was stabilized and does not require hospitalization at this time.  She has short-term follow-up planned  is stable for discharge. Will prescribe potassium and ondansetron at discharge.              Final Clinical Impression(s) / ED Diagnoses Final diagnoses:  Nausea and vomiting, unspecified vomiting type  Pain of upper abdomen    Rx / DC Orders ED Discharge Orders          Ordered    potassium chloride SA (KLOR-CON M) 20 MEQ tablet  Daily        11/01/21 1952    ondansetron (ZOFRAN) 8 MG tablet  Every 8 hours PRN        11/01/21 1952              Daleen Bo, MD 11/04/21 1246

## 2021-11-01 NOTE — ED Notes (Signed)
Pt states she is unable to provide a urine sample at this time. RN aware

## 2021-11-01 NOTE — Discharge Instructions (Addendum)
See your GI doctor next week as scheduled.  Have the GI doctor, or your PCP recheck your blood potassium level in a week or so.  To help the esophageal disorder, use an alkaloids slurry, before meals.  To make this you can either chew the Altoids, then swallow, or crush them with a little bit of water, then swallow.  Use 2 at a time, before each meal and at bedtime.  Continue your other medications as usual.  Your potassium was a little bit low, likely related to the vomiting.  We sent a prescription for potassium and Zofran to your pharmacy.

## 2021-11-01 NOTE — ED Triage Notes (Signed)
Pt BIB [facility] from home c/o abdominal pain that is radiating to the chest and back. Patient states that the pain began months ago but has progressively worsened. Patient was seen at [facility] last night. Patient describes the pain as a tearing sensation. EMS placed 20g IV and gave 150 mcg fentanyl and 4mg  of zofran.   Vital signs were:  182/100 110-HR 99% RA

## 2021-11-04 ENCOUNTER — Ambulatory Visit: Payer: Self-pay

## 2021-11-04 ENCOUNTER — Encounter: Payer: Self-pay | Admitting: Specialist

## 2021-11-04 ENCOUNTER — Other Ambulatory Visit: Payer: Self-pay

## 2021-11-04 ENCOUNTER — Ambulatory Visit (INDEPENDENT_AMBULATORY_CARE_PROVIDER_SITE_OTHER): Payer: 59 | Admitting: Specialist

## 2021-11-04 VITALS — BP 147/91 | HR 105 | Ht 61.0 in | Wt 228.0 lb

## 2021-11-04 DIAGNOSIS — M545 Low back pain, unspecified: Secondary | ICD-10-CM

## 2021-11-04 DIAGNOSIS — M7061 Trochanteric bursitis, right hip: Secondary | ICD-10-CM | POA: Diagnosis not present

## 2021-11-04 DIAGNOSIS — M6281 Muscle weakness (generalized): Secondary | ICD-10-CM

## 2021-11-04 DIAGNOSIS — M51369 Other intervertebral disc degeneration, lumbar region without mention of lumbar back pain or lower extremity pain: Secondary | ICD-10-CM

## 2021-11-04 DIAGNOSIS — M4316 Spondylolisthesis, lumbar region: Secondary | ICD-10-CM

## 2021-11-04 DIAGNOSIS — Z981 Arthrodesis status: Secondary | ICD-10-CM

## 2021-11-04 DIAGNOSIS — M5136 Other intervertebral disc degeneration, lumbar region: Secondary | ICD-10-CM

## 2021-11-04 DIAGNOSIS — G8929 Other chronic pain: Secondary | ICD-10-CM

## 2021-11-04 NOTE — Progress Notes (Signed)
Post-Op Visit Note   Patient: [name]           Date of Birth: Jun 24, 1971           MRN: [id number] Visit Date: 11/04/2021 PCP: [name], MD   Assessment & Plan:10 weeks post op lumbar fusion,   Chief Complaint:  Chief Complaint  Patient presents with   Lower Back - Pain   Right Hip - Pain, Follow-up   Visit Diagnoses: No diagnosis found. RIght leg with motor intact, she has pain with ROM right leg at the hip with pain with right hip flexiion with IR aand ER.  PlanAvoid frequent bending and stooping  No lifting greater than 10 lbs. May use ice or moist heat for pain. Weight loss is of benefit. Best medication for lumbar disc disease is arthritis medications like motrin, celebrex and naprosyn. Exercise is important to improve your indurance and does allow people to function better inspite of back pain.  Keep the same brace for when you are out of bed for 4 more weeks due to persistence of pain.  Follow-Up Instructions: No follow-ups on file.   Orders:  No orders of the defined types were placed in this encounter.  No orders of the defined types were placed in this encounter.   Imaging: No results found.  PMFS History: Patient Active Problem List   Diagnosis Date Noted   Spondylolisthesis, lumbar region 08/20/2021    Priority: High    Class: Chronic   Abnormal UGI series    Coffee ground emesis 10/15/2021   Gastritis and gastroduodenitis    Coffee ground vomiting 10/14/2021   Fusion of spine of lumbar region 08/20/2021   Chronic respiratory failure with hypoxia ([place]) 06/20/2021   Preop pulmonary/respiratory exam 06/20/2021   Pain of upper abdomen 11/28/2020   Recurrent vomiting 11/28/2020   COVID-19 09/16/2020   Dyspnea 08/26/2020   Rotator cuff tear 07/28/2019   Insulin dependent type 2 diabetes mellitus ([place]) 04/12/2019   Lactic acidosis 03/22/2019   Chronic right shoulder pain    Community acquired pneumonia 10/25/2018    Asthma, chronic, unspecified asthma severity, with acute exacerbation 10/25/2018   Acute respiratory failure with hypoxia and hypercapnia ([place]) 10/23/2018   Acute pain of right shoulder due to trauma 10/05/2018   Chronic bilateral low back pain without sciatica 10/05/2018   Dysphonia 10/05/2018   Severe asthma with exacerbation 10/05/2018   Rotator cuff arthropathy, right 06/15/2018   AKI (acute kidney injury) ([place]) 03/06/2018   Carpal tunnel syndrome 01/18/2018   Heart failure ([place]) 05/20/2017   Normocytic anemia 05/06/2017   Tracheostomy dependent ([place]) 03/26/2017   Dysphagia 03/26/2017   Klebsiella cystitis 03/14/2017   Morbid obesity with BMI of 40.0-44.9, adult ([place]) 03/13/2017   Subglottic stenosis 03/06/2017   Asthma 03/06/2017   Chronic diastolic heart failure ([place]) 03/06/2017   Tracheal stenosis 03/04/2017   Acute blood loss anemia    Generalized anxiety disorder    Chronic pain syndrome    GERD (gastroesophageal reflux disease) 02/22/2016   Depression 02/22/2016   Migraine 01/30/2015   Bulging lumbar disc 09/05/2013   Essential hypertension 09/05/2013   Allergic rhinitis, seasonal 08/11/2012   Chronic cough 08/11/2012   OSA (obstructive sleep apnea) 12/04/2011   Past Medical History:  Diagnosis Date   Allergy    Anxiety    Arthritis    Asthma    Chest pain of uncertain etiology 8/33/8250   Chronic back pain    Chronic chest pain  resolved, no problems since 2019 per patient 07/27/19   COPD (chronic obstructive pulmonary disease) (HCC)    Depression    DM (diabetes mellitus) ([place])    INSULIN DEPENDENT - TYPE 1   Dyspnea    GERD (gastroesophageal reflux disease)    Headache(784.0)    History of hiatal hernia    Hyperlipidemia    no med, diet controlled   Hypertension    Hypokalemia    Pneumonia    Respiratory disease 05/2017   uses inhalers, neb tx prn, no oxygen   Sleep apnea    does not use CPAP due to trach   Status post tracheostomy ([place])  03/14/2017   Last Assessment & Plan:  Formatting of this note might be different from the original. - [place] - On tach - ENT following along (please see above for current trach management plan)   Tracheostomy in [facility]) 02/2017    Family History  Problem Relation Age of Onset   Heart attack Mother    Stroke Mother    Diabetes Mother    Hypertension Mother    Arthritis Mother    Stroke Father    Asthma Sister    Hypertension Sister    Asthma Sister    Diabetes Sister    Asthma Brother    Seizures Brother    Asthma Brother    Diabetes Brother    Asthma Daughter    Asthma Son    Asthma Grandson    Colon cancer Neg Hx    Rectal cancer Neg Hx    Stomach cancer Neg Hx     Past Surgical History:  Procedure Laterality Date   ABDOMINAL HYSTERECTOMY  2009   APPENDECTOMY     BIOPSY  10/15/2021   Procedure: BIOPSY;  Surgeon: Thornton Park, MD;  Location: [place];  Service: Gastroenterology;;   CESAREAN SECTION     x 3   CHOLECYSTECTOMY N/A 03/02/2014   Procedure: LAPAROSCOPIC CHOLECYSTECTOMY;  Surgeon: Joyice Faster. Cornett, MD;  Location: [place];  Service: General;  Laterality: N/A;   ESOPHAGOGASTRODUODENOSCOPY (EGD) WITH PROPOFOL N/A 10/15/2021   Procedure: ESOPHAGOGASTRODUODENOSCOPY (EGD) WITH PROPOFOL;  Surgeon: Thornton Park, MD;  Location: [place];  Service: Gastroenterology;  Laterality: N/A;   HERNIA REPAIR     PANENDOSCOPY N/A 03/04/2017   Procedure: PANENDOSCOPY WITH POSSIBLE FOREIGN BODY REMOVAL;  Surgeon: Jodi Marble, MD;  Location: [place];  Service: ENT;  Laterality: N/A;   ROTATOR CUFF REPAIR Left    ROTATOR CUFF REPAIR Right 07/27/2019   SHOULDER ARTHROSCOPY WITH SUBACROMIAL DECOMPRESSION, ROTATOR CUFF REPAIR AND BICEP TENDON REPAIR Right 07/28/2019   Procedure: RIGHT SHOULDER ARTHROSCOP, MINI OPEN ROTATOR CUFF TEAR REPAIR,  BICEPS TENODESIS, DISTAL CLAVICLE EXCISION;  Surgeon: Meredith Pel, MD;  Location: [facility];  Service: Orthopedics;  Laterality: Right;   TOOTH EXTRACTION N/A 02/22/2021   Procedure: DENTAL RESTORATION/EXTRACTIONS;  Surgeon: Diona Browner, DMD;  Location: [facility];  Service: Oral Surgery;  Laterality: N/A;   TRACHEOSTOMY  02/2017   TUBAL LIGATION     VESICOVAGINAL FISTULA CLOSURE W/ TAH  2009   Social History   Occupational History   Not on file  Tobacco Use   Smoking status: Former    Packs/day: 0.50    Years: 25.00    Pack years: 12.50    Types: Cigarettes    Quit date: 01/21/2016    Years since quitting: 5.7   Smokeless tobacco: Never  Vaping Use   Vaping  Use: Never used  Substance and Sexual Activity   Alcohol use: Not Currently    Comment: social wine   Drug use: Not Currently    Types: Marijuana    Comment: couple times a month   Sexual activity: Yes    Partners: Male    Birth control/protection: Surgical    Comment: Hysterectomy

## 2021-11-04 NOTE — Telephone Encounter (Signed)
It looks like she is on medications for nausea and vomiting already.  Her appointment with GI comes up on Friday and I will recommend she keep the appointment as they will be in a better position to evaluate the symptoms.

## 2021-11-04 NOTE — Patient Instructions (Signed)
Avoid frequent bending and stooping  No lifting greater than 10 lbs. May use ice or moist heat for pain. Weight loss is of benefit. Best medication for lumbar disc disease is arthritis medications like motrin, celebrex and naprosyn. Exercise is important to improve your indurance and does allow people to function better inspite of back pain.  Keep the same brace for when you are out of bed for 4 more weeks due to persistence of pain.

## 2021-11-08 ENCOUNTER — Ambulatory Visit: Payer: 59 | Admitting: Nurse Practitioner

## 2021-11-08 ENCOUNTER — Encounter: Payer: Self-pay | Admitting: Family Medicine

## 2021-11-12 ENCOUNTER — Telehealth: Payer: Self-pay

## 2021-11-12 NOTE — Telephone Encounter (Signed)
Call placed to patient and informed her that a ride has been scheduled for her to her appointment at [facility] 11/14/2020 @ 1010.  [employer] ID : [id number]. Pick up time 0911 Informed patient of pick up time and explained that many times the drives come earlier. They may or may not text her with updated information but she needs to be ready, they will not wait.  She said she understood and was very Patent attorney.

## 2021-11-14 ENCOUNTER — Ambulatory Visit: Payer: 59 | Attending: Family Medicine | Admitting: Family Medicine

## 2021-11-14 VITALS — BP 167/112 | HR 94 | Ht 61.0 in | Wt 237.8 lb

## 2021-11-14 DIAGNOSIS — E1069 Type 1 diabetes mellitus with other specified complication: Secondary | ICD-10-CM | POA: Diagnosis not present

## 2021-11-14 DIAGNOSIS — I152 Hypertension secondary to endocrine disorders: Secondary | ICD-10-CM

## 2021-11-14 DIAGNOSIS — M5136 Other intervertebral disc degeneration, lumbar region: Secondary | ICD-10-CM

## 2021-11-14 DIAGNOSIS — E876 Hypokalemia: Secondary | ICD-10-CM

## 2021-11-14 DIAGNOSIS — E785 Hyperlipidemia, unspecified: Secondary | ICD-10-CM

## 2021-11-14 DIAGNOSIS — J4540 Moderate persistent asthma, uncomplicated: Secondary | ICD-10-CM

## 2021-11-14 DIAGNOSIS — K219 Gastro-esophageal reflux disease without esophagitis: Secondary | ICD-10-CM | POA: Diagnosis not present

## 2021-11-14 DIAGNOSIS — E1022 Type 1 diabetes mellitus with diabetic chronic kidney disease: Secondary | ICD-10-CM

## 2021-11-14 DIAGNOSIS — R1013 Epigastric pain: Secondary | ICD-10-CM

## 2021-11-14 DIAGNOSIS — N182 Chronic kidney disease, stage 2 (mild): Secondary | ICD-10-CM

## 2021-11-14 DIAGNOSIS — E1159 Type 2 diabetes mellitus with other circulatory complications: Secondary | ICD-10-CM | POA: Diagnosis not present

## 2021-11-14 DIAGNOSIS — R112 Nausea with vomiting, unspecified: Secondary | ICD-10-CM

## 2021-11-14 MED ORDER — ONDANSETRON HCL 4 MG PO TABS: 4.0000 mg | ORAL_TABLET | Freq: Three times a day (TID) | ORAL | 1 refills | Status: DC | PRN

## 2021-11-14 MED ORDER — PANTOPRAZOLE SODIUM 40 MG PO TBEC: 40.0000 mg | DELAYED_RELEASE_TABLET | Freq: Two times a day (BID) | ORAL | 3 refills | Status: DC

## 2021-11-14 NOTE — Progress Notes (Signed)
Subjective:  Patient ID: [name], female    DOB: 26-Jan-1971  Age: 51 y.o. MRN: [id number]  CC: Hospitalization Follow-up   [name] is a 51 y.o. year old female with a history of type 1 diabetes mellitus (A1c 7.4), hypertension, obstructive sleep apnea, asthma, depression, s/p tracheostomy secondary to intubation related tracheal injury, subglottic stenosis (status post balloon dilatation and Kenalog injection of stenosis tracheostomy tube exchange by ENT, status post right rotator cuff surgery in 07/2019. Uses 2L oxygen at night, s/p R L4L5 transforaminal lumbar interbody fusion with rods, screws and cage, local bone graft, allogen bone graft, vivigen in 09/2021. Hospitalized last month for coffee-ground emesis and also had acute kidney injury during her hospitalization for which antihypertensive regimen was changed.  EGD revealed no evidence of GI bleed, barium swallow revealed esophageal dysmotility.  Interval History: BP yesterday was 120/70 checked by her [name] but it is elevated today as she is yet to take her antihypertensive.  Her last creatinine had normalized to 1.0 and she has been on her Lasix and lisinopril which were previously held at discharge. She continues to have low back pain and is wearing a back brace.  Last seen by orthopedic Dr. Louanne Skye 2 weeks ago for follow-up and is on oxycodone for pain.  Her abdominal pain is better but she still has some nausea and vomiting; she states she can deal with the little abdominal cramping. She rescheduled her GI appt due to transportation Pain occurs in her epigastric region after meals Last K was 3.3 and she is on potassium but states it is too big to swallow. Barium swallow revealed: IMPRESSION: 1. Limited, single contrast upper GI. 2. Barium tablet arrested in the distal esophagus and did not pass into the stomach despite administration of additional barium. A mild stricture is not  excluded. 3. No evidence of a hiatal hernia or gastric outlet obstruction. 4. Mild nonspecific esophageal dysmotility.     Asthma is stable with no flares and her migraines are controlled.  Diabetes is managed by endocrine.  Past Medical History:  Diagnosis Date   Allergy    Anxiety    Arthritis    Asthma    Chest pain of uncertain etiology 2/99/3716   Chronic back pain    Chronic chest pain    resolved, no problems since 2019 per patient 07/27/19   COPD (chronic obstructive pulmonary disease) ([place])    Depression    DM (diabetes mellitus) ([place])    INSULIN DEPENDENT - TYPE 1   Dyspnea    GERD (gastroesophageal reflux disease)    Headache(784.0)    History of hiatal hernia    Hyperlipidemia    no med, diet controlled   Hypertension    Hypokalemia    Pneumonia    Respiratory disease 05/2017   uses inhalers, neb tx prn, no oxygen   Sleep apnea    does not use CPAP due to trach   Status post tracheostomy ([facility]) 03/14/2017   Last Assessment & Plan:  Formatting of this note might be different from the original. - [place] - On tach - ENT following along (please see above for current trach management plan)   Tracheostomy in place [facility]) 02/2017    Past Surgical History:  Procedure Laterality Date   ABDOMINAL HYSTERECTOMY  2009   APPENDECTOMY     BIOPSY  10/15/2021   Procedure: BIOPSY;  Surgeon: Thornton Park, MD;  Location: [place];  Service: Gastroenterology;;   CESAREAN SECTION     x 3   CHOLECYSTECTOMY N/A 03/02/2014   Procedure: LAPAROSCOPIC CHOLECYSTECTOMY;  Surgeon: Joyice Faster. Cornett, MD;  Location: [place];  Service: General;  Laterality: N/A;   ESOPHAGOGASTRODUODENOSCOPY (EGD) WITH PROPOFOL N/A 10/15/2021   Procedure: ESOPHAGOGASTRODUODENOSCOPY (EGD) WITH PROPOFOL;  Surgeon: Thornton Park, MD;  Location: [place];  Service: Gastroenterology;  Laterality: N/A;   HERNIA REPAIR     PANENDOSCOPY N/A 03/04/2017   Procedure:  PANENDOSCOPY WITH POSSIBLE FOREIGN BODY REMOVAL;  Surgeon: Jodi Marble, MD;  Location: [place];  Service: ENT;  Laterality: N/A;   ROTATOR CUFF REPAIR Left    ROTATOR CUFF REPAIR Right 07/27/2019   SHOULDER ARTHROSCOPY WITH SUBACROMIAL DECOMPRESSION, ROTATOR CUFF REPAIR AND BICEP TENDON REPAIR Right 07/28/2019   Procedure: RIGHT SHOULDER ARTHROSCOP, MINI OPEN ROTATOR CUFF TEAR REPAIR,  BICEPS TENODESIS, DISTAL CLAVICLE EXCISION;  Surgeon: Meredith Pel, MD;  Location: [place];  Service: Orthopedics;  Laterality: Right;   TOOTH EXTRACTION N/A 02/22/2021   Procedure: DENTAL RESTORATION/EXTRACTIONS;  Surgeon: Diona Browner, DMD;  [facility];  Service: Oral Surgery;  Laterality: N/A;   TRACHEOSTOMY  02/2017   TUBAL LIGATION     VESICOVAGINAL FISTULA CLOSURE W/ TAH  2009    Family History  Problem Relation Age of Onset   Heart attack Mother    Stroke Mother    Diabetes Mother    Hypertension Mother    Arthritis Mother    Stroke Father    Asthma Sister    Hypertension Sister    Asthma Sister    Diabetes Sister    Asthma Brother    Seizures Brother    Asthma Brother    Diabetes Brother    Asthma Daughter    Asthma Son    Asthma Grandson    Colon cancer Neg Hx    Rectal cancer Neg Hx    Stomach cancer Neg Hx     Allergies  Allergen Reactions   Reglan [Metoclopramide] Other (See Comments)    Panic attack    Outpatient Medications Prior to Visit  Medication Sig Dispense Refill   albuterol (PROAIR HFA) 108 (90 Base) MCG/ACT inhaler Inhale 2 puffs into the lungs every 6 (six) hours as needed for wheezing or shortness of breath. 18 g 6   albuterol (PROVENTIL) (2.5 MG/3ML) 0.083% nebulizer solution Take 3 mLs (2.5 mg total) by nebulization every 6 (six) hours as needed for wheezing or shortness of breath. 75 mL 3   aspirin EC 81 MG EC tablet Take 1 tablet (81 mg total) by mouth daily. Swallow whole. 30 tablet 11   atorvastatin (LIPITOR) 20 MG tablet Take 1 tablet (20 mg total)  by mouth daily. 90 tablet 1   Blood Glucose Monitoring Suppl (ONETOUCH VERIO) w/Device KIT 1 each by Does not apply route daily. Use to monitor glucose levels daily; E11.9 1 kit 0   buPROPion (WELLBUTRIN SR) 150 MG 12 hr tablet Take 1 tablet (150 mg total) by mouth 2 (two) times daily. 180 tablet 1   cetirizine (ZYRTEC) 10 MG tablet Take 1 tablet (10 mg total) by mouth at bedtime. 30 tablet 5   dapagliflozin propanediol (FARXIGA) 5 MG TABS tablet Take 1 tablet (5 mg total) by mouth daily before breakfast. 30 tablet 3   diclofenac Sodium (VOLTAREN) 1 % GEL Apply 4 g topically 4 (four) times daily. 100 g 1   dicyclomine (BENTYL) 20 MG tablet Take 1 tablet (20 mg total) by mouth  2 (two) times daily. 20 tablet 0   docusate sodium (COLACE) 100 MG capsule Take 1 capsule (100 mg total) by mouth 2 (two) times daily. 40 capsule 0   furosemide (LASIX) 20 MG tablet Take 1 tablet (20 mg total) by mouth daily. 30 tablet 2   gabapentin (NEURONTIN) 300 MG capsule Take 1 capsule (300 mg total) by mouth 2 (two) times daily.     glucose blood (ONETOUCH VERIO) test strip Use to monitor glucose levels daily; E11.9 100 each 2   hydrOXYzine (ATARAX/VISTARIL) 25 MG tablet Take 1 tablet (25 mg total) by mouth 3 (three) times daily as needed for anxiety. 180 tablet 1   Insulin Pen Needle 31G X 8 MM MISC Inject 1 Units as directed daily.     lisinopril (ZESTRIL) 20 MG tablet Take 1 tablet (20 mg total) by mouth daily. 90 tablet 1   Melatonin 3 MG TABS Take 3 mg by mouth at bedtime.     methocarbamol (ROBAXIN) 750 MG tablet Take 1 tablet (750 mg total) by mouth every 8 (eight) hours as needed for muscle spasms. 60 tablet 5   Misc. Devices MISC Nebulizer device. Diagnosis - Asthma 1 each 0   Misc. Devices [place] Shower chair 1 each 0   Misc. Devices MISC Bedside commode. Diagnosis- low back pain 1 each 0   Misc. Devices [employer].  Diagnosis-low back pain 1 each 0   mometasone-formoterol (DULERA) 200-5 MCG/ACT AERO Inhale 2  puffs into the lungs in the morning and at bedtime. 1 each 6   OneTouch Delica Lancets 70B MISC 1 each by Does not apply route daily. Use to monitor glucose levels daily; E11.9 100 each 2   potassium chloride SA (KLOR-CON M) 20 MEQ tablet Take 1 tablet (20 mEq total) by mouth daily. 7 tablet 0   prochlorperazine (COMPAZINE) 25 MG suppository Place 1 suppository (25 mg total) rectally every 12 (twelve) hours as needed for nausea or vomiting. 12 suppository 0   Semaglutide,0.25 or 0.5MG/DOS, (OZEMPIC, 0.25 OR 0.5 MG/DOSE,) 2 MG/1.5ML SOPN Inject 0.5 mg into the skin once a week. 4.5 mL 3   sertraline (ZOLOFT) 100 MG tablet Take 1 tablet (100 mg total) by mouth at bedtime. 90 tablet 1   silver sulfADIAZINE (SILVADENE) 1 % cream Apply 1 application topically daily. 50 g 1   sucralfate (CARAFATE) 1 g tablet Take 1 tablet (1 g total) by mouth 4 (four) times daily -  with meals and at bedtime. 120 tablet 1   SUMAtriptan (IMITREX) 100 MG tablet At the onset of a migraine; may repeat in 2 hours. Max daily dose 225m (Patient taking differently: Take 100 mg by mouth every 2 (two) hours as needed for migraine.) 20 tablet 6   topiramate (TOPAMAX) 100 MG tablet Take 1.5 tablets (150 mg total) by mouth 2 (two) times daily. 90 tablet 1   ondansetron (ZOFRAN-ODT) 4 MG disintegrating tablet 416mODT q4 hours prn nausea/vomit 10 tablet 0   pantoprazole (PROTONIX) 20 MG tablet Take 1 tablet (20 mg total) by mouth daily. 30 tablet 0   pantoprazole (PROTONIX) 40 MG tablet Take 1 tablet (40 mg total) by mouth 2 (two) times daily. (Patient taking differently: Take 40 mg by mouth daily as needed (heartburn).) 60 tablet 3   polyethylene glycol (MIRALAX / GLYCOLAX) 17 g packet Take 17 g by mouth daily as needed for mild constipation. 14 each 0   ondansetron (ZOFRAN) 8 MG tablet Take 1 tablet (8 mg total)  by mouth every 8 (eight) hours as needed for nausea or vomiting. 20 tablet 0   No facility-administered medications prior to  visit.     ROS Review of Systems  Constitutional:  Negative for activity change, appetite change and fatigue.  HENT:  Negative for congestion, sinus pressure and sore throat.   Eyes:  Negative for visual disturbance.  Respiratory:  Negative for cough, chest tightness, shortness of breath and wheezing.   Cardiovascular:  Negative for chest pain and palpitations.  Gastrointestinal:  Positive for abdominal pain, nausea and vomiting. Negative for abdominal distention and constipation.  Endocrine: Negative for polydipsia.  Genitourinary:  Negative for dysuria and frequency.  Musculoskeletal:  Positive for back pain. Negative for arthralgias.  Skin:  Negative for rash.  Neurological:  Negative for tremors, light-headedness and numbness.  Hematological:  Does not bruise/bleed easily.  Psychiatric/Behavioral:  Negative for agitation and behavioral problems.    Objective:  BP (!) 167/112    Pulse 94    Ht 5' 1" (1.549 m)    Wt 237 lb 12.8 oz (107.9 kg)    LMP  (LMP Unknown)    SpO2 100%    BMI 44.93 kg/m   BP/Weight 11/14/2021 11/04/2021 12/18/9240  Systolic BP 683 419 622  Diastolic BP 297 91 99  Wt. (Lbs) 237.8 228 228.99  BMI 44.93 43.08 43.27      Physical Exam Constitutional:      Appearance: She is well-developed.  Neck:     Comments: Tracheostomy tube in place Cardiovascular:     Rate and Rhythm: Normal rate.     Heart sounds: Normal heart sounds. No murmur heard. Pulmonary:     Effort: Pulmonary effort is normal.     Breath sounds: Normal breath sounds. No wheezing or rales.  Chest:     Chest wall: No tenderness.  Abdominal:     General: Bowel sounds are normal. There is no distension.     Palpations: Abdomen is soft. There is no mass.     Tenderness: There is no abdominal tenderness.  Musculoskeletal:     Right lower leg: No edema.     Left lower leg: No edema.     Comments: Back brace in place  Neurological:     Mental Status: She is alert and oriented to person,  place, and time.  Psychiatric:        Mood and Affect: Mood normal.    CMP Latest Ref Rng & Units 11/01/2021 10/31/2021 10/29/2021  Glucose 70 - 99 mg/dL 153(H) 141(H) 167(H)  BUN 6 - 20 mg/dL _0 Creatinine 0.44 - 1.00 mg/dL 1.00 1.18(H) 1.08(H)  Sodium 135 - 145 mmol/L 139 140 141  Potassium 3.5 - 5.1 mmol/L 3.3(L) 3.3(L) 3.3(L)  Chloride 98 - 111 mmol/L 106 107 107  CO2 22 - 32 mmol/L 21(L) 21(L) 22  Calcium 8.9 - 10.3 mg/dL 8.9 9.1 9.2  Total Protein 6.5 - 8.1 g/dL 8.1 8.0 7.8  Total Bilirubin 0.3 - 1.2 mg/dL 1.1 0.5 0.4  Alkaline Phos 38 - 126 U/L 94 90 92  AST 15 - 41 U/L _1 ALT 0 - 44 U/L _2 Lipid Panel     Component Value Date/Time   CHOL 165 06/15/2018 1031   TRIG 124 06/15/2018 1031   HDL 51 06/15/2018 1031   CHOLHDL 3.2 06/15/2018 1031   CHOLHDL 3.9 02/22/2016 0954   VLDL 26 02/22/2016 0954   LDLCALC 89 06/15/2018  1031    CBC    Component Value Date/Time   WBC 8.2 11/01/2021 1647   RBC 4.22 11/01/2021 1647   HGB 11.5 (L) 11/01/2021 1647   HGB 11.9 04/12/2018 1035   HCT 35.5 (L) 11/01/2021 1647   HCT 34.8 04/12/2018 1035   PLT 451 (H) 11/01/2021 1647   PLT 373 04/12/2018 1035   MCV 84.1 11/01/2021 1647   MCV 84 04/12/2018 1035   MCH 27.3 11/01/2021 1647   MCHC 32.4 11/01/2021 1647   RDW 15.7 (H) 11/01/2021 1647   RDW 14.6 04/12/2018 1035   LYMPHSABS 2.0 11/01/2021 1647   LYMPHSABS 4.0 (H) 04/12/2018 1035   MONOABS 0.4 11/01/2021 1647   EOSABS 0.0 11/01/2021 1647   EOSABS 0.0 04/12/2018 1035   BASOSABS 0.0 11/01/2021 1647   BASOSABS 0.0 04/12/2018 1035    Lab Results  Component Value Date   HGBA1C 8.3 (H) 10/15/2021    Assessment & Plan:  1. Gastroesophageal reflux disease without esophagitis Worsened by esophageal dysmotility Advised to eat small frequent portions and sit upright after eating - pantoprazole (PROTONIX) 40 MG tablet; Take 1 tablet (40 mg total) by mouth 2 (two) times daily.  Dispense: 60 tablet; Refill:  3  2. Hypertension associated with diabetes ([place]) Uncontrolled due to the fact that she is yet to take her antihypertensive No regimen changes blood pressure yesterday was controlled at home Counseled on Diabetic diet, my plate method, 423 minutes of moderate intensity exercise/week Blood sugar logs with fasting goals of 80-120 mg/dl, random of less than 180 and in the event of sugars less than 60 mg/dl or greater than 400 mg/dl encouraged to notify the clinic. Advised on the need for annual eye exams, annual foot exams, Pneumonia vaccine. - Basic Metabolic Panel  3. Hyperlipidemia due to type 1 diabetes mellitus (H[facility]) Controlled Continue statin - LP+Non-HDL Cholesterol  4. Epigastric pain Improved Underlying GERD Currently on PPI Keep follow-up appointment with GI  5. Nausea and vomiting, unspecified vomiting type Uncontrolled - ondansetron (ZOFRAN) 4 MG tablet; Take 1 tablet (4 mg total) by mouth every 8 (eight) hours as needed for nausea or vomiting.  Dispense: 30 tablet; Refill: 1  6. Moderate persistent asthma without complication Stable  7. Bulging lumbar disc Status post surgery Uncontrolled Continue with back brace and follow-up with orthopedic  8. Type 1 diabetes mellitus with stage 2 chronic kidney disease ([place]) Uncontrolled with A1c above goal of less than 7.0 Follow-up with endocrine for regimen adjustment  9. Hypokalemia Last potassium was 3.3 We will check potassium level and send off potassium suspension to pharmacy   Health Care Maintenance: She is due for Pap smear, mammogram and colonoscopy and has been advised to schedule this at next visit. Meds ordered this encounter  Medications   pantoprazole (PROTONIX) 40 MG tablet    Sig: Take 1 tablet (40 mg total) by mouth 2 (two) times daily.    Dispense:  60 tablet    Refill:  3   ondansetron (ZOFRAN) 4 MG tablet    Sig: Take 1 tablet (4 mg total) by mouth every 8 (eight) hours as needed for nausea or  vomiting.    Dispense:  30 tablet    Refill:  1    Follow-up: Return in about 1 month (around 12/12/2021) for medicare wellness exam.       [name], MD, FAAFP. [facility] and [place], [place]   11/14/2021, 1:43 PM

## 2021-11-14 NOTE — Patient Instructions (Signed)
Nausea and Vomiting, Adult °Nausea is the feeling that you have an upset stomach or that you are about to vomit. As nausea gets worse, it can lead to vomiting. Vomiting is when stomach contents forcefully come out of your mouth as a result of nausea. Vomiting can make you feel weak and cause you to become dehydrated. °Dehydration can make you feel tired and thirsty, cause you to have a dry mouth, and decrease how often you urinate. Older adults and people with other diseases or a weak disease-fighting system (immune system) are at higher risk for dehydration. It is important to treat your nausea and vomiting as told by your health care provider. °Follow these instructions at home: °Watch your symptoms for any changes. Tell your health care provider about them. °Eating and drinking °  °Take an oral rehydration solution (ORS). This is a drink that is sold at pharmacies and retail stores. °Drink clear fluids slowly and in small amounts as you are able. Clear fluids include water, ice chips, low-calorie sports drinks, and fruit juice that has water added (diluted fruit juice). °Eat bland, easy-to-digest foods in small amounts as you are able. These foods include bananas, applesauce, rice, lean meats, toast, and crackers. °Avoid fluids that contain a lot of sugar or caffeine, such as energy drinks, sports drinks, and soda. °Avoid alcohol. °Avoid spicy or fatty foods. °General instructions °Take over-the-counter and prescription medicines only as told by your health care provider. °Drink enough fluid to keep your urine pale yellow. °Wash your hands often using soap and water for at least 20 seconds. If soap and water are not available, use hand sanitizer. °Make sure that everyone in your household washes their hands well and often. °Rest at home while you recover. °Watch your condition for any changes. °Take slow and deep breaths when you feel nauseous. °Keep all follow-up visits. This is important. °Contact a health care  provider if: °Your symptoms get worse. °You have new symptoms. °You have a fever. °You cannot drink fluids without vomiting. °Your nausea does not go away after 2 days. °You feel light-headed or dizzy. °You have a headache. °You have muscle cramps. °You have a rash. °You have pain while urinating. °Get help right away if: °You have pain in your chest, neck, arm, or jaw. °You feel extremely weak or you faint. °You have persistent vomiting. °You have vomit that is bright red or looks like black coffee grounds. °You have bloody or black stools (feces) or stools that look like tar. °You have a severe headache, a stiff neck, or both. °You have severe pain, cramping, or bloating in your abdomen. °You have difficulty breathing, or you are breathing very quickly. °Your heart is beating very quickly. °Your skin feels cold and clammy. °You feel confused. °You have signs of dehydration, such as: °Dark urine, very little urine, or no urine. °Cracked lips. °Dry mouth. °Sunken eyes. °Sleepiness. °Weakness. °These symptoms may be an emergency. Get help right away. Call 911. °Do not wait to see if the symptoms will go away. °Do not drive yourself to the hospital. °Summary °Nausea is the feeling that you have an upset stomach or that you are about to vomit. As nausea gets worse, it can lead to vomiting. Vomiting can make you feel weak and cause you to become dehydrated. °Follow instructions from your health care provider about eating and drinking to prevent dehydration. °Take over-the-counter and prescription medicines only as told by your health care provider. °Contact your health care provider if   your symptoms get worse, or you have new symptoms. °Keep all follow-up visits. This is important. °This information is not intended to replace advice given to you by your health care provider. Make sure you discuss any questions you have with your health care provider. °Document Revised: 03/15/2021 Document Reviewed: 03/15/2021 °Elsevier  Patient Education © 2022 [employer]. ° °

## 2021-11-15 ENCOUNTER — Other Ambulatory Visit: Payer: Self-pay | Admitting: Family Medicine

## 2021-11-15 ENCOUNTER — Encounter: Payer: Self-pay | Admitting: Family Medicine

## 2021-11-15 DIAGNOSIS — E1069 Type 1 diabetes mellitus with other specified complication: Secondary | ICD-10-CM

## 2021-11-15 DIAGNOSIS — E785 Hyperlipidemia, unspecified: Secondary | ICD-10-CM

## 2021-11-15 LAB — BASIC METABOLIC PANEL
BUN/Creatinine Ratio: 15 (ref 9–23)
BUN: 13 mg/dL (ref 6–24)
CO2: 23 mmol/L (ref 20–29)
Calcium: 9.3 mg/dL (ref 8.7–10.2)
Chloride: 104 mmol/L (ref 96–106)
Creatinine, Ser: 0.88 mg/dL (ref 0.57–1.00)
Glucose: 187 mg/dL — ABNORMAL HIGH (ref 70–99)
Potassium: 3.6 mmol/L (ref 3.5–5.2)
Sodium: 141 mmol/L (ref 134–144)
eGFR: 79 mL/min/{1.73_m2} (ref >59)

## 2021-11-15 LAB — LP+NON-HDL CHOLESTEROL
Cholesterol, Total: 188 mg/dL (ref 100–199)
HDL: 48 mg/dL (ref >39)
LDL Chol Calc (NIH): 103 mg/dL — ABNORMAL HIGH (ref 0–99)
Total Non-HDL-Chol (LDL+VLDL): 140 mg/dL — ABNORMAL HIGH (ref 0–129)
Triglycerides: 213 mg/dL — ABNORMAL HIGH (ref 0–149)
VLDL Cholesterol Cal: 37 mg/dL (ref 5–40)

## 2021-11-15 MED ORDER — ATORVASTATIN CALCIUM 40 MG PO TABS: 40.0000 mg | ORAL_TABLET | Freq: Every day | ORAL | 1 refills | Status: DC

## 2021-11-19 ENCOUNTER — Telehealth: Payer: Self-pay

## 2021-11-19 NOTE — Telephone Encounter (Signed)
At request of Dr Margarita Rana, call placed to patient to inform her of options for transportation to her medical appointments. Message left with call back requested to this CM.  Need to explain to her that she can call [facility] - there should be a number on her insurance card and she can call DSS # [phone] to schedule rides.

## 2021-11-20 NOTE — Telephone Encounter (Signed)
Call placed to patient regarding transportation to medical appointments Explained to her that she can call [facility] Medicare - there should be a number on her insurance card and she can also call [employer] # [phone] to schedule rides. She said she understood and she also noted that she already has a ride scheduled for an appointment tomorrow ?

## 2021-11-25 ENCOUNTER — Ambulatory Visit (INDEPENDENT_AMBULATORY_CARE_PROVIDER_SITE_OTHER): Payer: 59 | Admitting: Nurse Practitioner

## 2021-11-25 ENCOUNTER — Encounter: Payer: Self-pay | Admitting: Nurse Practitioner

## 2021-11-25 VITALS — BP 152/84 | HR 100 | Ht 61.0 in | Wt 243.2 lb

## 2021-11-25 DIAGNOSIS — R1013 Epigastric pain: Secondary | ICD-10-CM | POA: Diagnosis not present

## 2021-11-25 DIAGNOSIS — R112 Nausea with vomiting, unspecified: Secondary | ICD-10-CM

## 2021-11-25 DIAGNOSIS — Z86010 Personal history of colonic polyps: Secondary | ICD-10-CM | POA: Diagnosis not present

## 2021-11-25 DIAGNOSIS — R110 Nausea: Secondary | ICD-10-CM

## 2021-11-25 DIAGNOSIS — R1110 Vomiting, unspecified: Secondary | ICD-10-CM | POA: Diagnosis not present

## 2021-11-25 MED ORDER — ONDANSETRON HCL 4 MG PO TABS: 4.0000 mg | ORAL_TABLET | Freq: Three times a day (TID) | ORAL | 2 refills | Status: DC | PRN

## 2021-11-25 MED ORDER — NA SULFATE-K SULFATE-MG SULF 17.5-3.13-1.6 GM/177ML PO SOLN: 1.0000 | ORAL | 0 refills | Status: DC

## 2021-11-25 NOTE — Patient Instructions (Signed)
We have sent the following medications to your pharmacy for you to pick up at your convenience: ?Zofran, Suprep  ? ?You have been scheduled for a colonoscopy. Please follow written instructions given to you at your visit today.  ?Please pick up your prep supplies at the pharmacy within the next 1-3 days. ?If you use inhalers (even only as needed), please bring them with you on the day of your procedure. ? ?Thank you for choosing me and [facility]. ? ?Tye Savoy NP  ? ?

## 2021-11-25 NOTE — Progress Notes (Signed)
ASSESSMENT AND PLAN    # 51 yo female recently hospitalized with acute worsening of chronic nausea, vomiting and upper abdominal pain with previous normal EGD, gastric emptying study and CT scans. During recent admission work included an unrevealing abdominal series, CT AP and RUQ [place]. Inpatient repeat EGD with gastric biopsies showed minimal focal inflammation of esophagus, moderate to marked chronic antral and fundic gastritis with lymphoid aggregates and focal intestinal metaplasia . No H.pylori. We recommended she try to minimize narcotics, treat anxiety. Prior to discharge she did get an UGI series which showed the barium tablet not passing through distal esophagus despite additional barium.  --I cannot explain the esophageal findings on EGD given absence of significant findings on EGD three days prior ( ? Maybe esophageal dysmotility or spasm). However, I don't think finding explains her chronic upper GI symptoms. Doesn't have dysphagia. I will ask Dr. Henrene Pastor to review the study and give his thoughts.   --Unfortunately she does require daily opioids which can exacerbate her GI symptoms.  --Refill Zofran 4 gm Q 8 hours prn.  --Bentyl helps cramping if she takes it with her oxycondone so happy to refill that if needed  # History of multiple colon polyps in 2021. She is overdue for 1 year follow up colonoscopy. She feels better than when admitted to hospital in January and feels she can tolerate a bowel prep.  --Schedule for a colonoscopy. The risks and benefits of colonoscopy with possible polypectomy / biopsies were discussed and the patient agrees to proceed.    # Aneursymal dilatation of ascending thoracic aorta on chest CT scan w/ contrast  ( 4.1 cm) done for chest trauma July 2022. However, CT angio of chest /abd / pelvis 10/12/21 (done in ED when she presented with upper back pain / chest pain) was negative for any acute intrathoracic, abdominal, or pelvic pathology. No aortic dissection  or aneurysm.   # Diabetes Hgb A1c 8.3 in late January 2023.   # Asthma,subglottic stenosis ans tracheal stenosis s/p tracheostomy several year ago    Patient Profile:   [name] is a 51 y.o. female with a past medical history of migraines, chronic nasuea, vomiting and abdominal pain, migraines, depression, CKD, HTN, COVID19, severe asthma,  cholecystectomy.  Additional medical history as listed in [place] .   HISTORY OF PRESENT ILLNESS:   Chief complaint: hospital follow up  Patient is known to Dr. Henrene Pastor from a screening colonoscopy in Sept 2021 which was remarkable for several polyps.   March 2022 office visit for evaluation of a year history of epigastric pain, nausea/ vomiting. She was taking BID pantoprazole. Neither Pepto nor alka seltzer helped. Underwent EGD, it was normal   January 2023 Hospital consult  Admitted with acute on chronic nausea, vomiting and burning epigastric pain. Dicyclomine was no longer effective. No acute findings on CT abd/pelvis w/ contrast, CTA chest/abd/pel nor abdominal ultrasound . Inpatient EGD with findings of subtle congestion in mucosa at pylorus and mild duodenal erythema. Moderate to marked chronic antral and fundic gastritis with focal intestinal metaplasia. No H.pylori.  UGI series prior to discharge >>barium tablet would not pass through distal esophagus. Barium was give office follow up to discuss UGI results  Interval History:    Since getting out of the hospital she has been back to ED three times with nausea, vomiting and epigastric cramping. Labs have been stable. Hgb mid 11 range. Normal WBC. Normal liver chemistries, normal lipase.She has tried numerous OTC  agents without relief. Bentyl helps if she takes it with her pain medication. Symptoms worse when she tries to eat. No associated weight loss, up ~ 15 pounds since mid February. No bowel changes.    LABORATORY DATA  Hepatic Function Latest Ref Rng & Units 11/01/2021  10/31/2021 10/29/2021  Total Protein 6.5 - 8.1 g/dL 8.1 8.0 7.8  Albumin 3.5 - 5.0 g/dL 4.0 4.1 3.8  AST 15 - 41 U/L 24 18 17   ALT 0 - 44 U/L 18 19 18   Alk Phosphatase 38 - 126 U/L 94 90 92  Total Bilirubin 0.3 - 1.2 mg/dL 1.1 0.5 0.4  Bilirubin, Direct 0.0 - 0.2 mg/dL - - -    CBC Latest Ref Rng & Units 11/01/2021 10/31/2021 10/30/2021  WBC 4.0 - 10.5 K/uL 8.2 10.4 9.2  Hemoglobin 12.0 - 15.0 g/dL 11.5(L) 11.5(L) 11.1(L)  Hematocrit 36.0 - 46.0 % 35.5(L) 35.6(L) 34.7(L)  Platelets 150 - 400 K/uL 451(H) 451(H) 407(H)    PREVIOUS GI EVALUATIONS:   Endoscopies:  Sept 2021 colonoscopy. Screening study.  --A total of 13 polyps were removed.  There was a large, 15 mm polyp at the descending colon the others were 3 to 10 mm in size.  Sigmoid diverticulosis.  Pathology on the bulk of the stools was that of tubular adenomas, there was a sessile serrated polyp as well.  A few of the fragments showed atypia but felt short of clear-cut HGD. April 2022 EGD for nausea, vomiting and abdominal pain  --Normal January 2023 EGD for nausea, vomiting and upper abdominal pain --subtle congestion in mucosa at pylorus of unclear significance.  This was biopsied.  Bile present in stomach.  Mild duodenal erythema biopsied. DUODENUM, BIOPSY:  Reactive duodenal mucosa  Normal villous architecture  B. STOMACH, ANTRUM, BODY AND FUNDUS, BIOPSY:  Moderate to marked chronic antral and fundic gastritis with lymphoid  aggregates and focal intestinal metaplasia  Helicobacter stain negative (IHC, adequate control)  Negative for dysplasia and carcinoma  C. ESOPHAGUS, DISTAL, BIOPSY:  Reactive squamous mucosa  D. ESOPHAGUS, MID, BIOPSY:  Reactive squamous mucosa with minimal focal acute inflammation   Imaging:   January 2021 CTAP w/ contrast for abdominal pain  IMPRESSION: 1. No acute abnormality in the abdomen/pelvis. 2. Multi cystic lesion in the left adnexa measures 6.5 x 3.9 cm, may represent single septated  cystic lesion versus multiple adjacent cysts. This is new from 2017. Recommend pelvic ultrasound for further evaluation. This could be performed on an elective basis, and is unlikely to be related to acute umbilical pain. 3. Hepatic steatosis.   November 2021 CTAP w/ contrast for abdominal pain  IMPRESSION: No acute intra-abdominal abnormality. No definite radiographic explanation for the patient's reported symptoms.   Moderate hepatic steatosis, stable since prior examination.   Interval decrease in size of complex multi cystic lesion within the left ovary. While this favors a benign etiology, this is not optimally characterized on this examination. This could be more completely evaluated with dedicated pelvic sonography or contrast enhanced MRI examination if indicated  April 2022 Gastric Emptying study --Normal  January 2023 CTAP w/ contrast IMPRESSION: 1. No acute findings within the abdomen or pelvis. 2. Hepatic steatosis. 3. Unchanged mild increased size of left ovary. No discrete left adnexal mass noted  January 2023 CT angio chest / abd / pelvis -Negative for any acute intrathoracic, abdominal, or pelvic pathology. No aortic dissection or aneurysm.  January 2023 Acute abdominal series IMPRESSION: 1. No acute cardiopulmonary findings. 2. No evidence  for bowel perforation or obstruction.  January 2023 RUQ US IMPRESSION: Status post cholecystectomy.  Fatty liver.   January 2023 UGI series IMPRESSION: 1. Limited, single contrast upper GI. 2. Barium tablet arrested in the distal esophagus and did not pass into the stomach despite administration of additional barium. A mild stricture is not excluded. 3. No evidence of a hiatal hernia or gastric outlet obstruction. 4. Mild nonspecific esophageal dysmotility.    Labs:  January 2023  Cr 1.31, AST 46/ ALT 28, tbili 2.1, lipase 39, WBC 11, hgb 12.3   Past Medical History:  Diagnosis Date   Allergy    Anxiety     Arthritis    Asthma    Chest pain of uncertain etiology 9/39/0300   Chronic back pain    Chronic chest pain    resolved, no problems since 2019 per patient 07/27/19   COPD (chronic obstructive pulmonary disease) ([place])    Depression    DM (diabetes mellitus) ([place])    INSULIN DEPENDENT - TYPE 1   Dyspnea    GERD (gastroesophageal reflux disease)    Headache(784.0)    History of hiatal hernia    Hyperlipidemia    no med, diet controlled   Hypertension    Hypokalemia    Pneumonia    Respiratory disease 05/2017   uses inhalers, neb tx prn, no oxygen   Sleep apnea    does not use CPAP due to trach   Status post tracheostomy ([place]) 03/14/2017   Last Assessment & Plan:  Formatting of this note might be different from the original. - [place] - On tach - ENT following along (please see above for current trach management plan)   Tracheostomy in place [facility]) 02/2017    Past Surgical History:  Procedure Laterality Date   ABDOMINAL HYSTERECTOMY  2009   APPENDECTOMY     BIOPSY  10/15/2021   Procedure: BIOPSY;  Surgeon: Thornton Park, MD;  Location: [facility];  Service: Gastroenterology;;   CESAREAN SECTION     x 3   CHOLECYSTECTOMY N/A 03/02/2014   Procedure: LAPAROSCOPIC CHOLECYSTECTOMY;  Surgeon: Joyice Faster. Cornett, MD;  Location: [facility];  Service: General;  Laterality: N/A;   ESOPHAGOGASTRODUODENOSCOPY (EGD) WITH PROPOFOL N/A 10/15/2021   Procedure: ESOPHAGOGASTRODUODENOSCOPY (EGD) WITH PROPOFOL;  Surgeon: Thornton Park, MD;  Location: [facility];  Service: Gastroenterology;  Laterality: N/A;   HERNIA REPAIR     PANENDOSCOPY N/A 03/04/2017   Procedure: PANENDOSCOPY WITH POSSIBLE FOREIGN BODY REMOVAL;  Surgeon: Jodi Marble, MD;  Location: [facility];  Service: ENT;  Laterality: N/A;   ROTATOR CUFF REPAIR Left    ROTATOR CUFF REPAIR Right 07/27/2019   SHOULDER ARTHROSCOPY WITH SUBACROMIAL DECOMPRESSION, ROTATOR CUFF REPAIR AND BICEP TENDON  REPAIR Right 07/28/2019   Procedure: RIGHT SHOULDER ARTHROSCOP, MINI OPEN ROTATOR CUFF TEAR REPAIR,  BICEPS TENODESIS, DISTAL CLAVICLE EXCISION;  Surgeon: Meredith Pel, MD;  Location: [facility];  Service: Orthopedics;  Laterality: Right;   TOOTH EXTRACTION N/A 02/22/2021   Procedure: DENTAL RESTORATION/EXTRACTIONS;  Surgeon: Diona Browner, DMD;  Location: [facility];  Service: Oral Surgery;  Laterality: N/A;   TRACHEOSTOMY  02/2017   TUBAL LIGATION     VESICOVAGINAL FISTULA CLOSURE W/ TAH  2009      Current Medications, Allergies, Family History and Social History were reviewed in [employer] record.     Current Outpatient Medications  Medication Sig Dispense Refill   albuterol (PROAIR HFA) 108 (90 Base) MCG/ACT inhaler Inhale  2 puffs into the lungs every 6 (six) hours as needed for wheezing or shortness of breath. 18 g 6   albuterol (PROVENTIL) (2.5 MG/3ML) 0.083% nebulizer solution Take 3 mLs (2.5 mg total) by nebulization every 6 (six) hours as needed for wheezing or shortness of breath. 75 mL 3   aspirin EC 81 MG EC tablet Take 1 tablet (81 mg total) by mouth daily. Swallow whole. 30 tablet 11   atorvastatin (LIPITOR) 40 MG tablet Take 1 tablet (40 mg total) by mouth daily. 90 tablet 1   Blood Glucose Monitoring Suppl (ONETOUCH VERIO) w/Device KIT 1 each by Does not apply route daily. Use to monitor glucose levels daily; E11.9 1 kit 0   buPROPion (WELLBUTRIN SR) 150 MG 12 hr tablet Take 1 tablet (150 mg total) by mouth 2 (two) times daily. 180 tablet 1   cetirizine (ZYRTEC) 10 MG tablet Take 1 tablet (10 mg total) by mouth at bedtime. 30 tablet 5   dapagliflozin propanediol (FARXIGA) 5 MG TABS tablet Take 1 tablet (5 mg total) by mouth daily before breakfast. 30 tablet 3   diclofenac Sodium (VOLTAREN) 1 % GEL Apply 4 g topically 4 (four) times daily. 100 g 1   dicyclomine (BENTYL) 20 MG tablet Take 1 tablet (20 mg total) by mouth 2 (two) times daily. 20 tablet 0    docusate sodium (COLACE) 100 MG capsule Take 1 capsule (100 mg total) by mouth 2 (two) times daily. 40 capsule 0   furosemide (LASIX) 20 MG tablet Take 1 tablet (20 mg total) by mouth daily. 30 tablet 2   gabapentin (NEURONTIN) 300 MG capsule Take 1 capsule (300 mg total) by mouth 2 (two) times daily.     glucose blood (ONETOUCH VERIO) test strip Use to monitor glucose levels daily; E11.9 100 each 2   hydrOXYzine (ATARAX/VISTARIL) 25 MG tablet Take 1 tablet (25 mg total) by mouth 3 (three) times daily as needed for anxiety. 180 tablet 1   Insulin Pen Needle 31G X 8 MM MISC Inject 1 Units as directed daily.     lisinopril (ZESTRIL) 20 MG tablet Take 1 tablet (20 mg total) by mouth daily. 90 tablet 1   Melatonin 3 MG TABS Take 3 mg by mouth at bedtime.     methocarbamol (ROBAXIN) 750 MG tablet Take 1 tablet (750 mg total) by mouth every 8 (eight) hours as needed for muscle spasms. 60 tablet 5   Misc. Devices MISC Nebulizer device. Diagnosis - Asthma 1 each 0   Misc. Devices [employer] Shower chair 1 each 0   Misc. Devices MISC Bedside commode. Diagnosis- low back pain 1 each 0   Misc. Devices [employer].  Diagnosis-low back pain 1 each 0   mometasone-formoterol (DULERA) 200-5 MCG/ACT AERO Inhale 2 puffs into the lungs in the morning and at bedtime. 1 each 6   ondansetron (ZOFRAN) 4 MG tablet Take 1 tablet (4 mg total) by mouth every 8 (eight) hours as needed for nausea or vomiting. 30 tablet 1   OneTouch Delica Lancets 67J MISC 1 each by Does not apply route daily. Use to monitor glucose levels daily; E11.9 100 each 2   pantoprazole (PROTONIX) 40 MG tablet Take 1 tablet (40 mg total) by mouth 2 (two) times daily. 60 tablet 3   potassium chloride SA (KLOR-CON M) 20 MEQ tablet Take 1 tablet (20 mEq total) by mouth daily. 7 tablet 0   prochlorperazine (COMPAZINE) 25 MG suppository Place 1 suppository (25 mg total) rectally  every 12 (twelve) hours as needed for nausea or vomiting. 12 suppository 0    Semaglutide,0.25 or 0.5MG/DOS, (OZEMPIC, 0.25 OR 0.5 MG/DOSE,) 2 MG/1.5ML SOPN Inject 0.5 mg into the skin once a week. 4.5 mL 3   sertraline (ZOLOFT) 100 MG tablet Take 1 tablet (100 mg total) by mouth at bedtime. 90 tablet 1   silver sulfADIAZINE (SILVADENE) 1 % cream Apply 1 application topically daily. 50 g 1   sucralfate (CARAFATE) 1 g tablet Take 1 tablet (1 g total) by mouth 4 (four) times daily -  with meals and at bedtime. 120 tablet 1   SUMAtriptan (IMITREX) 100 MG tablet At the onset of a migraine; may repeat in 2 hours. Max daily dose 210m (Patient taking differently: Take 100 mg by mouth every 2 (two) hours as needed for migraine.) 20 tablet 6   topiramate (TOPAMAX) 100 MG tablet Take 1.5 tablets (150 mg total) by mouth 2 (two) times daily. 90 tablet 1   No current facility-administered medications for this visit.    Review of Systems: No chest pain. No shortness of breath. No urinary complaints.   PHYSICAL EXAM :    Wt Readings from Last 3 Encounters:  11/25/21 243 lb 4 oz (110.3 kg)  11/14/21 237 lb 12.8 oz (107.9 kg)  11/04/21 228 lb (103.4 kg)    BP (!) 152/84    Pulse 100    Ht 5' 1"  (1.549 m)    Wt 243 lb 4 oz (110.3 kg)    LMP  (LMP Unknown)    BMI 45.96 kg/m  Constitutional:  Generally well appearing female in no acute distress. Psychiatric: Frustrated.   EENT: Pupils normal.  Conjunctivae are normal. No scleral icterus. Neck supple.  Cardiovascular: Normal rate, regular rhythm. No edema Pulmonary/chest: Effort normal and breath sounds normal. No wheezing, rales or rhonchi. Abdominal: Soft, nondistended, generalized upper abdominal tenderness.  Bpwel sounds active throughout. There are no masses palpable. No hepatomegaly. Neurological: Alert and oriented to person place and time. Skin: Skin is warm and dry. No rashes noted.  PTye Savoy NP  11/25/2021, 3:25 PM  Cc:  NCharlott Rakes MD

## 2021-11-28 ENCOUNTER — Encounter: Payer: Self-pay | Admitting: Nurse Practitioner

## 2021-11-28 ENCOUNTER — Ambulatory Visit: Payer: 59 | Admitting: Endocrinology

## 2021-11-28 NOTE — Progress Notes (Signed)
GI NP assessment noted  ?Problems likely related to opioids and/or poorly controlled DM and/or other meds (e.g., Ozempic) and/or gastroparesis. ?Unfortunately, not much additional to add to extensive GI workup and symptomatic therapies other than the obvious (better diabetic control and stop offending agents---all under the supervision of her PCP). ?

## 2021-12-02 ENCOUNTER — Telehealth: Payer: Self-pay

## 2021-12-02 NOTE — Telephone Encounter (Signed)
Patient contacted to schedule mammogram.  ?LVM for pt to call back as soon as possible.  ? ? ?RE: Mobile Mammo event located at: ? ?[facility]  ?      ?[address]    ?[place] [id number]    ? ?Date: April 7th  ? ?

## 2021-12-06 ENCOUNTER — Ambulatory Visit (INDEPENDENT_AMBULATORY_CARE_PROVIDER_SITE_OTHER): Payer: 59 | Admitting: Endocrinology

## 2021-12-06 ENCOUNTER — Encounter: Payer: Self-pay | Admitting: Endocrinology

## 2021-12-06 ENCOUNTER — Other Ambulatory Visit: Payer: Self-pay

## 2021-12-06 VITALS — BP 162/108 | HR 111 | Ht 61.0 in | Wt 242.6 lb

## 2021-12-06 DIAGNOSIS — E119 Type 2 diabetes mellitus without complications: Secondary | ICD-10-CM

## 2021-12-06 DIAGNOSIS — E1022 Type 1 diabetes mellitus with diabetic chronic kidney disease: Secondary | ICD-10-CM

## 2021-12-06 DIAGNOSIS — Z794 Long term (current) use of insulin: Secondary | ICD-10-CM

## 2021-12-06 DIAGNOSIS — N183 Chronic kidney disease, stage 3 unspecified: Secondary | ICD-10-CM | POA: Diagnosis not present

## 2021-12-06 DIAGNOSIS — E1065 Type 1 diabetes mellitus with hyperglycemia: Secondary | ICD-10-CM

## 2021-12-06 LAB — POCT GLYCOSYLATED HEMOGLOBIN (HGB A1C): Hemoglobin A1C: 8 % — AB (ref 4.0–5.6)

## 2021-12-06 MED ORDER — TRESIBA FLEXTOUCH 200 UNIT/ML ~~LOC~~ SOPN: 150.0000 [IU] | PEN_INJECTOR | Freq: Every day | SUBCUTANEOUS | 3 refills | Status: DC
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
## 2021-12-06 NOTE — Patient Instructions (Addendum)
Your blood pressure is high today.  Please see your primary care provider soon, to have it rechecked.   ?I have sent a prescription to your pharmacy: to increase the Tresiba to 150 units per day.   ?Please continue the same other medications.   ?check your blood sugar twice a day.  vary the time of day when you check, between before the 3 meals, and at bedtime.  also check if you have symptoms of your blood sugar being too high or too low.  please keep a record of the readings and bring it to your next appointment here (or you can bring the meter itself).  You can write it on any piece of paper.  please call us sooner if your blood sugar goes below 70, or if you have a lot of readings over 200.   ?Blood tests are requested for you today.  We'll let you know about the results.   ?Please come back for a follow-up appointment in 3 months.   ? ? ?

## 2021-12-07 LAB — T4, FREE: Free T4: 0.8 ng/dL (ref 0.8–1.8)

## 2021-12-07 LAB — TSH: TSH: 0.54 mIU/L

## 2021-12-09 ENCOUNTER — Encounter: Payer: 59 | Admitting: Family Medicine

## 2021-12-09 ENCOUNTER — Other Ambulatory Visit: Payer: Self-pay | Admitting: Internal Medicine

## 2021-12-09 ENCOUNTER — Ambulatory Visit: Payer: 59 | Admitting: Specialist

## 2021-12-12 ENCOUNTER — Other Ambulatory Visit: Payer: Self-pay

## 2021-12-12 DIAGNOSIS — N182 Chronic kidney disease, stage 2 (mild): Secondary | ICD-10-CM

## 2021-12-12 MED ORDER — OZEMPIC (0.25 OR 0.5 MG/DOSE) 2 MG/1.5ML ~~LOC~~ SOPN: 0.5000 mg | PEN_INJECTOR | SUBCUTANEOUS | 3 refills | Status: DC

## 2021-12-23 ENCOUNTER — Other Ambulatory Visit (HOSPITAL_COMMUNITY)
Admission: RE | Admit: 2021-12-23 | Discharge: 2021-12-23 | Disposition: A | Discharge status: Home / Self Care | Payer: 59 | Source: Ambulatory Visit | Attending: Family Medicine | Admitting: Family Medicine

## 2021-12-23 ENCOUNTER — Ambulatory Visit: Payer: 59 | Attending: Family Medicine | Admitting: Family Medicine

## 2021-12-23 ENCOUNTER — Encounter: Payer: Self-pay | Admitting: Family Medicine

## 2021-12-23 VITALS — BP 126/86 | HR 93 | Ht 61.0 in | Wt 251.0 lb

## 2021-12-23 DIAGNOSIS — Z124 Encounter for screening for malignant neoplasm of cervix: Secondary | ICD-10-CM | POA: Diagnosis not present

## 2021-12-23 DIAGNOSIS — Z1211 Encounter for screening for malignant neoplasm of colon: Secondary | ICD-10-CM

## 2021-12-23 DIAGNOSIS — F331 Major depressive disorder, recurrent, moderate: Secondary | ICD-10-CM | POA: Diagnosis not present

## 2021-12-23 DIAGNOSIS — G43119 Migraine with aura, intractable, without status migrainosus: Secondary | ICD-10-CM

## 2021-12-23 DIAGNOSIS — Z01419 Encounter for gynecological examination (general) (routine) without abnormal findings: Secondary | ICD-10-CM | POA: Diagnosis present

## 2021-12-23 DIAGNOSIS — B3731 Acute candidiasis of vulva and vagina: Secondary | ICD-10-CM

## 2021-12-23 DIAGNOSIS — Z1151 Encounter for screening for human papillomavirus (HPV): Secondary | ICD-10-CM | POA: Insufficient documentation

## 2021-12-23 DIAGNOSIS — Z0000 Encounter for general adult medical examination without abnormal findings: Secondary | ICD-10-CM

## 2021-12-23 DIAGNOSIS — M5136 Other intervertebral disc degeneration, lumbar region: Secondary | ICD-10-CM | POA: Diagnosis not present

## 2021-12-23 DIAGNOSIS — Z0001 Encounter for general adult medical examination with abnormal findings: Secondary | ICD-10-CM | POA: Diagnosis not present

## 2021-12-23 DIAGNOSIS — E1069 Type 1 diabetes mellitus with other specified complication: Secondary | ICD-10-CM

## 2021-12-23 DIAGNOSIS — Z1231 Encounter for screening mammogram for malignant neoplasm of breast: Secondary | ICD-10-CM

## 2021-12-23 MED ORDER — FLUCONAZOLE 150 MG PO TABS: 150.0000 mg | ORAL_TABLET | Freq: Once | ORAL | 0 refills | Status: DC

## 2021-12-23 MED ORDER — AMITRIPTYLINE HCL 10 MG PO TABS: 10.0000 mg | ORAL_TABLET | Freq: Every day | ORAL | 3 refills | Status: DC

## 2021-12-23 NOTE — Progress Notes (Signed)
Back pain ?Headache for 3 days. ?

## 2021-12-23 NOTE — Progress Notes (Signed)
? ?Subjective:  ? [name] is a 51 y.o. female who presents for Medicare Annual (Subsequent) preventive examination. ?Medical history significant for a history of type 1 diabetes mellitus (A1c 8.0), hypertension, obstructive sleep apnea, asthma, depression, s/p tracheostomy secondary to intubation related tracheal injury, subglottic stenosis (status post balloon dilatation and Kenalog injection of stenosis tracheostomy tube exchange by ENT, status post right rotator cuff surgery in 07/2019. Uses 2L oxygen at night, s/p R L4L5 transforaminal lumbar interbody fusion with rods, screws and cage, local bone graft, allogen bone graft, vivigen in 09/2021. ? ?She is due for breast cancer, cervical cancer and colorectal cancer screening. ?Colonoscopy appointment comes up in a few days ? ?Her PHQ-9 reveals a score of 18 and she is currently on Zoloft and Wellbutrin for her depression.  Her depression stems from her chronic medical conditions which limits her ADLs and the fact that her daughter is currently wheelchair-bound.  Denies suicidal ideations or intents as she has her grandkids and a lot of family support. ?Started psychotherapy but then had to stop due to hospitalization. ? ?Her migraines are not controlled as she has had a headache for the last 3 days and she complains of Topamax making her face feel full and numb hence she has not been taking it consistently.  She does use Imitrex to which has been beneficial.  I referred her to neurology in 02/2021 but it appears she never received this message and never made it to neurology appointment. ?She also has back pain which is severe rated as an 8/10.  Currently taking oxycodone which she received from pain management and she is followed by orthopedics.  She has an upcoming appointment to reassess site of her surgery. ? ?Review of Systems    ?General: negative for fever, weight loss, appetite change ?Eyes: no visual symptoms. ?ENT: no ear symptoms, no  sinus tenderness, no nasal congestion or sore throat. ?Neck: no pain  ?Respiratory: no wheezing, shortness of breath, cough ?Cardiovascular: no chest pain, no dyspnea on exertion, no pedal edema, no orthopnea. ?Gastrointestinal: no abdominal pain, no diarrhea, no constipation ?Genito-Urinary: no urinary frequency, no dysuria, no polyuria. ?Hematologic: no bruising ?Endocrine: no cold or heat intolerance ?Neurological: +headaches, no seizures, no tremors ?Musculoskeletal: no joint pains, no joint swelling, +back pain ?Skin: no pruritus, no rash. ?Psychological: +depression, no anxiety,  ? ?  ? ?   ?Objective:  ?  ?Today's Vitals  ? 12/23/21 0940 12/23/21 0941  ?BP: 126/86   ?Pulse: 93   ?SpO2: 100%   ?Weight: 251 lb (113.9 kg)   ?Height: 5' 1"  (1.549 m)   ?PainSc:  10-Worst pain ever  ? ?Body mass index is 47.43 kg/m?. ? ? ?  12/23/2021  ?  9:42 AM 10/31/2021  ?  6:22 PM 10/31/2021  ?  2:06 PM 10/29/2021  ?  8:50 PM 10/13/2021  ?  2:39 PM 10/12/2021  ?  4:17 PM 10/11/2021  ?  4:54 AM  ?Advanced Directives  ?Does Patient Have a Medical Advance Directive? No No No No No No No  ?Would patient like information on creating a medical advance directive?   No - Patient declined  No - Patient declined No - Patient declined No - Patient declined  ? ? ?Current Medications (verified) ?Outpatient Encounter Medications as of 12/23/2021  ?Medication Sig  ? albuterol (PROAIR HFA) 108 (90 Base) MCG/ACT inhaler Inhale 2 puffs into the lungs every 6 (six) hours as needed for wheezing or shortness of breath.  ?  albuterol (PROVENTIL) (2.5 MG/3ML) 0.083% nebulizer solution Take 3 mLs (2.5 mg total) by nebulization every 6 (six) hours as needed for wheezing or shortness of breath.  ? aspirin EC 81 MG EC tablet Take 1 tablet (81 mg total) by mouth daily. Swallow whole.  ? atorvastatin (LIPITOR) 40 MG tablet Take 1 tablet (40 mg total) by mouth daily.  ? Blood Glucose Monitoring Suppl (ONETOUCH VERIO) w/Device KIT 1 each by Does not apply route daily.  Use to monitor glucose levels daily; E11.9  ? buPROPion (WELLBUTRIN SR) 150 MG 12 hr tablet Take 1 tablet (150 mg total) by mouth 2 (two) times daily.  ? cetirizine (ZYRTEC) 10 MG tablet TAKE 1 TABLET BY MOUTH AT BEDTIME  ? dapagliflozin propanediol (FARXIGA) 5 MG TABS tablet Take 1 tablet (5 mg total) by mouth daily before breakfast.  ? diclofenac Sodium (VOLTAREN) 1 % GEL Apply 4 g topically 4 (four) times daily.  ? dicyclomine (BENTYL) 20 MG tablet Take 1 tablet (20 mg total) by mouth 2 (two) times daily.  ? docusate sodium (COLACE) 100 MG capsule Take 1 capsule (100 mg total) by mouth 2 (two) times daily.  ? furosemide (LASIX) 20 MG tablet Take 1 tablet (20 mg total) by mouth daily.  ? gabapentin (NEURONTIN) 300 MG capsule Take 1 capsule (300 mg total) by mouth 2 (two) times daily.  ? glucose blood (ONETOUCH VERIO) test strip Use to monitor glucose levels daily; E11.9  ? hydrOXYzine (ATARAX/VISTARIL) 25 MG tablet Take 1 tablet (25 mg total) by mouth 3 (three) times daily as needed for anxiety.  ? insulin degludec (TRESIBA FLEXTOUCH) 200 UNIT/ML FlexTouch Pen Inject 150 Units into the skin daily.  ? Insulin Pen Needle 31G X 8 MM MISC Inject 1 Units as directed daily.  ? lisinopril (ZESTRIL) 20 MG tablet Take 1 tablet (20 mg total) by mouth daily.  ? Melatonin 3 MG TABS Take 3 mg by mouth at bedtime.  ? methocarbamol (ROBAXIN) 750 MG tablet Take 1 tablet (750 mg total) by mouth every 8 (eight) hours as needed for muscle spasms.  ? Misc. Devices MISC Nebulizer device. Diagnosis - Asthma  ? Misc. Devices [place] Shower chair  ? Misc. Devices MISC Bedside commode. Diagnosis- low back pain  ? Misc. Devices [employer].  Diagnosis-low back pain  ? mometasone-formoterol (DULERA) 200-5 MCG/ACT AERO Inhale 2 puffs into the lungs in the morning and at bedtime.  ? Na Sulfate-K Sulfate-Mg Sulf (SUPREP BOWEL PREP KIT) 17.5-3.13-1.6 GM/177ML SOLN Take 1 kit by mouth as directed. For colonoscopy prep  ? ondansetron (ZOFRAN) 4 MG  tablet Take 1 tablet (4 mg total) by mouth every 8 (eight) hours as needed for nausea or vomiting.  ? OneTouch [place] Lancets 02V MISC 1 each by Does not apply route daily. Use to monitor glucose levels daily; E11.9  ? oxyCODONE-acetaminophen (PERCOCET) 10-325 MG tablet Take 1 tablet by mouth 5 (five) times daily as needed.  ? pantoprazole (PROTONIX) 40 MG tablet Take 1 tablet (40 mg total) by mouth 2 (two) times daily.  ? potassium chloride SA (KLOR-CON M) 20 MEQ tablet Take 1 tablet (20 mEq total) by mouth daily.  ? Semaglutide,0.25 or 0.5MG/DOS, (OZEMPIC, 0.25 OR 0.5 MG/DOSE,) 2 MG/1.5ML SOPN Inject 0.5 mg into the skin once a week.  ? sertraline (ZOLOFT) 100 MG tablet Take 1 tablet (100 mg total) by mouth at bedtime.  ? silver sulfADIAZINE (SILVADENE) 1 % cream Apply 1 application topically daily.  ? [DISCONTINUED] fluticasone-salmeterol (ADVAIR HFA)  230-21 MCG/ACT inhaler Inhale 2 puffs into the lungs 2 (two) times daily.  ? ?No facility-administered encounter medications on file as of 12/23/2021.  ? ? ?Allergies (verified) ?Reglan [metoclopramide]  ? ?History: ?Past Medical History:  ?Diagnosis Date  ? Allergy   ? Anxiety   ? Arthritis   ? Asthma   ? Chest pain of uncertain etiology 0/38/3338  ? Chronic back pain   ? Chronic chest pain   ? resolved, no problems since 2019 per patient 07/27/19  ? COPD (chronic obstructive pulmonary disease) ([place])   ? Depression   ? DM (diabetes mellitus) ([place])   ? INSULIN DEPENDENT - TYPE 1  ? Dyspnea   ? GERD (gastroesophageal reflux disease)   ? Headache(784.0)   ? History of hiatal hernia   ? Hyperlipidemia   ? no med, diet controlled  ? Hypertension   ? Hypokalemia   ? Pneumonia   ? Respiratory disease 05/2017  ? uses inhalers, neb tx prn, no oxygen  ? Sleep apnea   ? does not use CPAP due to trach  ? Status post tracheostomy ([place]) 03/14/2017  ? Last Assessment & Plan:  Formatting of this note might be different from the original. - [place]  - On tach - ENT following along (please see above for current trach management plan)  ? Tracheostomy in place [facility]) 02/2017  ? ?Past Surgical History:  ?Procedure Laterality Date  ? ABDOMINAL HYSTERECTOMY

## 2021-12-24 LAB — CERVICOVAGINAL ANCILLARY ONLY
Bacterial Vaginitis (gardnerella): NEGATIVE
Candida Glabrata: NEGATIVE
Candida Vaginitis: POSITIVE — AB
Chlamydia: NEGATIVE
Comment: NEGATIVE
Comment: NEGATIVE
Comment: NEGATIVE
Comment: NEGATIVE
Comment: NEGATIVE
Comment: NORMAL
Neisseria Gonorrhea: NEGATIVE
Trichomonas: NEGATIVE

## 2021-12-25 ENCOUNTER — Ambulatory Visit (AMBULATORY_SURGERY_CENTER): Payer: 59 | Admitting: Internal Medicine

## 2021-12-25 ENCOUNTER — Encounter: Payer: Self-pay | Admitting: Internal Medicine

## 2021-12-25 VITALS — BP 162/86 | HR 97 | Temp 98.0°F | Resp 17 | Ht 61.0 in | Wt 243.0 lb

## 2021-12-25 DIAGNOSIS — D124 Benign neoplasm of descending colon: Secondary | ICD-10-CM

## 2021-12-25 DIAGNOSIS — Z86010 Personal history of colonic polyps: Secondary | ICD-10-CM | POA: Diagnosis not present

## 2021-12-25 DIAGNOSIS — D122 Benign neoplasm of ascending colon: Secondary | ICD-10-CM

## 2021-12-25 DIAGNOSIS — D123 Benign neoplasm of transverse colon: Secondary | ICD-10-CM

## 2021-12-25 LAB — CYTOLOGY - PAP
Adequacy: ABSENT
Comment: NEGATIVE
Diagnosis: NEGATIVE
High risk HPV: NEGATIVE

## 2021-12-25 LAB — MICROALBUMIN / CREATININE URINE RATIO
Creatinine, Urine: 350.3 mg/dL
Microalb/Creat Ratio: 8 mg/g creat (ref 0–29)
Microalbumin, Urine: 28.8 ug/mL

## 2021-12-25 MED ORDER — SODIUM CHLORIDE 0.9 % IV SOLN: 500.0000 mL | Freq: Once | INTRAVENOUS | Status: DC

## 2021-12-25 NOTE — Op Note (Signed)
[facility] ?Patient Name: [name] ?Procedure Date: 12/25/2021 3:52 PM ?MRN: [id number] ?Endoscopist: Docia Chuck. Henrene Pastor , MD ?Age: 51 ?Referring MD:  ?Date of Birth: 08/18/1971 ?Gender: Female ?Account #: [id number] ?Procedure:                Colonoscopy with cold snare polypectomy x 6 ?Indications:              High risk colon cancer surveillance: Personal  ?                          history of adenoma (10 mm or greater in size), High  ?                          risk colon cancer surveillance: Personal history of  ?                          adenoma with villous component, High risk colon  ?                          cancer surveillance: Personal history of multiple  ?                          (3 or more) adenomas, High risk colon cancer  ?                          surveillance: Personal history of sessile serrated  ?                          colon polyp (less than 10 mm in size) with no  ?                          dysplasia. Index examination September 2021 ?Medicines:                Monitored Anesthesia Care ?Procedure:                Pre-Anesthesia Assessment: ?                          - Prior to the procedure, a History and Physical  ?                          was performed, and patient medications and  ?                          allergies were reviewed. The patient's tolerance of  ?                          previous anesthesia was also reviewed. The risks  ?                          and benefits of the procedure and the sedation  ?                          options and risks were discussed with the patient.  ?  All questions were answered, and informed consent  ?                          was obtained. Prior Anticoagulants: The patient has  ?                          taken no previous anticoagulant or antiplatelet  ?                          agents. ASA Grade Assessment: III - A patient with  ?                          severe systemic disease. After reviewing the  risks  ?                          and benefits, the patient was deemed in  ?                          satisfactory condition to undergo the procedure. ?                          After obtaining informed consent, the colonoscope  ?                          was passed under direct vision. Throughout the  ?                          procedure, the patient's blood pressure, pulse, and  ?                          oxygen saturations were monitored continuously. The  ?                          CF [id number] #[id number] was introduced through the anus  ?                          and advanced to the the cecum, identified by  ?                          appendiceal orifice and ileocecal valve. The  ?                          ileocecal valve, appendiceal orifice, and rectum  ?                          were photographed. The quality of the bowel  ?                          preparation was excellent. The colonoscopy was  ?                          performed without difficulty. The patient tolerated  ?  the procedure well. The bowel preparation used was  ?                          SUPREP via split dose instruction. ?Scope In: 4:16:44 PM ?Scope Out: 4:32:10 PM ?Scope Withdrawal Time: 0 hours 10 minutes 31 seconds  ?Total Procedure Duration: 0 hours 15 minutes 26 seconds  ?Findings:                 Six polyps were found in the descending colon,  ?                          transverse colon and ascending colon. The polyps  ?                          were 2 to 6 mm in size. These polyps were removed  ?                          with a cold snare. Resection and retrieval were  ?                          complete. ?                          The exam was otherwise without abnormality on  ?                          direct and retroflexion views. ?Complications:            No immediate complications. Estimated blood loss:  ?                          None. ?Estimated Blood Loss:     Estimated blood loss: none. ?Impression:                - Six 2 to 6 mm polyps in the descending colon, in  ?                          the transverse colon and in the ascending colon,  ?                          removed with a cold snare. Resected and retrieved. ?                          - The examination was otherwise normal on direct  ?                          and retroflexion views. ?Recommendation:           - Repeat colonoscopy in 3 years for surveillance. ?                          - Patient has a contact number available for  ?                          emergencies. The signs and symptoms of potential  ?  delayed complications were discussed with the  ?                          patient. Return to normal activities tomorrow.  ?                          Written discharge instructions were provided to the  ?                          patient. ?                          - Resume previous diet. ?                          - Continue present medications. ?                          - Await pathology results. ?Docia Chuck. Henrene Pastor, MD ?12/25/2021 4:40:29 PM ?This report has been signed electronically. ?

## 2021-12-25 NOTE — Progress Notes (Signed)
Pt states no changes since previsit appt 11/25/21 ?

## 2021-12-25 NOTE — Progress Notes (Signed)
ASSESSMENT AND PLAN   ?  ?# 51 yo female recently hospitalized with acute worsening of chronic nausea, vomiting and upper abdominal pain with previous normal EGD, gastric emptying study and CT scans. During recent admission work included an unrevealing abdominal series, CT AP and RUQ [place]. Inpatient repeat EGD with gastric biopsies showed minimal focal inflammation of esophagus, moderate to marked chronic antral and fundic gastritis with lymphoid aggregates and focal intestinal metaplasia . No H.pylori. We recommended she try to minimize narcotics, treat anxiety. Prior to discharge she did get an UGI series which showed the barium tablet not passing through distal esophagus despite additional barium.  ?--I cannot explain the esophageal findings on EGD given absence of significant findings on EGD three days prior ( ? Maybe esophageal dysmotility or spasm). However, I don't think finding explains her chronic upper GI symptoms. Doesn't have dysphagia. I will ask Dr. Henrene Pastor to review the study and give his thoughts.   ?--Unfortunately she does require daily opioids which can exacerbate her GI symptoms.  ?--Refill Zofran 4 gm Q 8 hours prn.  ?--Bentyl helps cramping if she takes it with her oxycondone so happy to refill that if needed ?  ?# History of multiple colon polyps in 2021. She is overdue for 1 year follow up colonoscopy. She feels better than when admitted to hospital in January and feels she can tolerate a bowel prep.  ?--Schedule for a colonoscopy. The risks and benefits of colonoscopy with possible polypectomy / biopsies were discussed and the patient agrees to proceed.  ?  ?  ?# Aneursymal dilatation of ascending thoracic aorta on chest CT scan w/ contrast  ( 4.1 cm) done for chest trauma July 2022. However, CT angio of chest /abd / pelvis 10/12/21 (done in ED when she presented with upper back pain / chest pain) was negative for any acute intrathoracic, abdominal, or pelvic pathology. No aortic ?dissection or  aneurysm. ?  ?# Diabetes Hgb A1c 8.3 in late January 2023.  ?  ?# Asthma,subglottic stenosis ans tracheal stenosis s/p tracheostomy several year ago  ?  ?  ?Patient Profile:  ?  ?[name] is a 51 y.o. female with a past medical history of migraines, chronic nasuea, vomiting and abdominal pain, migraines, depression, CKD, HTN, COVID19, severe asthma,  cholecystectomy.  Additional medical history as listed in [place] .  ?  ?HISTORY OF PRESENT ILLNESS:  ?  ?Chief complaint: hospital follow up ?  ?Patient is known to Dr. Henrene Pastor from a screening colonoscopy in Sept 2021 which was remarkable for several polyps.  ?  ?March 2022 ?office visit for evaluation of a year history of epigastric pain, nausea/ vomiting. She was taking BID pantoprazole. Neither Pepto nor alka seltzer helped. Underwent EGD, it was normal  ?  ?January 2023 Hospital consult  ?Admitted with acute on chronic nausea, vomiting and burning epigastric pain. Dicyclomine was no longer effective. No acute findings on CT abd/pelvis w/ contrast, CTA chest/abd/pel nor abdominal ultrasound . Inpatient EGD with findings of subtle congestion in mucosa at pylorus and mild duodenal erythema. Moderate to marked chronic antral and fundic gastritis with focal intestinal metaplasia. No H.pylori.  ?UGI series prior to discharge >>barium tablet would not pass through distal esophagus. Barium was give office follow up to discuss UGI results ?  ?Interval History:  ?  ?  ?Since getting out of the hospital she has been back to ED three times with nausea, vomiting and epigastric cramping. Labs have been stable. Hgb mid  11 range. Normal WBC. Normal liver chemistries, normal lipase.She has tried numerous OTC agents without relief. Bentyl helps if she takes it with her pain medication. Symptoms worse when she tries to eat. No associated weight loss, up ~ 15 pounds since mid February. No bowel changes.  ?  ?  ?LABORATORY DATA ?  ?Hepatic Function Latest Ref Rng & Units  11/01/2021 10/31/2021 10/29/2021  ?Total Protein 6.5 - 8.1 g/dL 8.1 8.0 7.8  ?Albumin 3.5 - 5.0 g/dL 4.0 4.1 3.8  ?AST 15 - 41 U/L _0 ?ALT 0 - 44 U/L _1 ?Alk Phosphatase 38 - 126 U/L 94 90 92  ?Total Bilirubin 0.3 - 1.2 mg/dL 1.1 0.5 0.4  ?Bilirubin, Direct 0.0 - 0.2 mg/dL - - -  ?  ?  ?CBC Latest Ref Rng & Units 11/01/2021 10/31/2021 10/30/2021  ?WBC 4.0 - 10.5 K/uL 8.2 10.4 9.2  ?Hemoglobin 12.0 - 15.0 g/dL 11.5(L) 11.5(L) 11.1(L)  ?Hematocrit 36.0 - 46.0 % 35.5(L) 35.6(L) 34.7(L)  ?Platelets 150 - 400 K/uL 451(H) 451(H) 407(H)  ?  ?  ?PREVIOUS GI EVALUATIONS:  ?  ?Endoscopies:  ?Sept 2021 colonoscopy. Screening study.  ?--A total of 13 polyps were removed.  There was a large, 15 mm polyp at the descending colon the others were 3 to 10 mm in size.  Sigmoid diverticulosis.  Pathology on the bulk of the stools was that of tubular adenomas, there was a sessile serrated polyp as well.  A few of the fragments showed atypia but felt short of clear-cut HGD. ?April 2022 EGD for nausea, vomiting and abdominal pain  ?--Normal ?January 2023 EGD for nausea, vomiting and upper abdominal pain ?--subtle congestion in mucosa at pylorus of unclear significance.  This was biopsied.  Bile present in stomach.  Mild duodenal erythema biopsied. ?DUODENUM, BIOPSY:  ?Reactive duodenal mucosa  ?Normal villous architecture  ?B. STOMACH, ANTRUM, BODY AND FUNDUS, BIOPSY:  ?Moderate to marked chronic antral and fundic gastritis with lymphoid  ?aggregates and focal intestinal metaplasia  ?Helicobacter stain negative (IHC, adequate control)  ?Negative for dysplasia and carcinoma  ?C. ESOPHAGUS, DISTAL, BIOPSY:  ?Reactive squamous mucosa  ?D. ESOPHAGUS, MID, BIOPSY:  ?Reactive squamous mucosa with minimal focal acute inflammation  ?  ?Imaging:  ?  ?January 2021 CTAP w/ contrast for abdominal pain  ?IMPRESSION: ?1. No acute abnormality in the abdomen/pelvis. ?2. Multi cystic lesion in the left adnexa measures 6.5 x 3.9 cm, may ?represent  single septated cystic lesion versus multiple adjacent ?cysts. This is new from 2017. Recommend pelvic ultrasound for ?further evaluation. This could be performed on an elective basis, ?and is unlikely to be related to acute umbilical pain. ?3. Hepatic steatosis. ?  ?  ?November 2021 CTAP w/ contrast for abdominal pain  ?IMPRESSION: ?No acute intra-abdominal abnormality. No definite radiographic ?explanation for the patient's reported symptoms. ?  ?Moderate hepatic steatosis, stable since prior examination. ?  ?Interval decrease in size of complex multi cystic lesion within the ?left ovary. While this favors a benign etiology, this is not ?optimally characterized on this examination. This could be more ?completely evaluated with dedicated pelvic sonography or contrast ?enhanced MRI examination if indicated ?  ?April 2022 Gastric Emptying study ?--Normal ?  ?January 2023 CTAP w/ contrast ?IMPRESSION: ?1. No acute findings within the abdomen or pelvis. ?2. Hepatic steatosis. ?3. Unchanged mild increased size of left ovary. No discrete left ?adnexal mass noted ?  ?January 2023 CT angio chest / abd / pelvis ?-Negative  for any acute intrathoracic, abdominal, or pelvic pathology. No aortic ?dissection or aneurysm. ?  ?January 2023 Acute abdominal series ?IMPRESSION: ?1. No acute cardiopulmonary findings. ?2. No evidence for bowel perforation or obstruction. ?  ?January 2023 RUQ [place] ?IMPRESSION: ?Status post cholecystectomy.  Fatty liver. ?  ?January 2023 UGI series ?IMPRESSION: ?1. Limited, single contrast upper GI. ?2. Barium tablet arrested in the distal esophagus and did not pass ?into the stomach despite administration of additional barium. A mild ?stricture is not excluded. ?3. No evidence of a hiatal hernia or gastric outlet obstruction. ?4. Mild nonspecific esophageal dysmotility. ?  ?  ?Labs:  ?January 2023  ?Cr 1.31, AST 46/ ALT 28, tbili 2.1, lipase 39, WBC 11, hgb 12.3 ?  ?  ?    ?Past Medical History:   ?Diagnosis Date  ? Allergy    ? Anxiety    ? Arthritis    ? Asthma    ? Chest pain of uncertain etiology 3/73/4287  ? Chronic back pain    ? Chronic chest pain    ?  resolved, no problems since 2019 per patient 1

## 2021-12-25 NOTE — Progress Notes (Signed)
PT taken to PACU. Monitors in place. VSS. A/O x 3. Report given to RN.  ?

## 2021-12-25 NOTE — Patient Instructions (Signed)
Handout on polyps provided  ? ?Await pathology results.  ? ?Continue current medications.  ? ?Repeat colonoscopy in 3 years  ? ?YOU HAD AN ENDOSCOPIC PROCEDURE TODAY AT THE [facility]:   Refer to the procedure report that was given to you for any specific questions about what was found during the examination.  If the procedure report does not answer your questions, please call your gastroenterologist to clarify.  If you requested that your care partner not be given the details of your procedure findings, then the procedure report has been included in a sealed envelope for you to review at your convenience later. ? ?YOU SHOULD EXPECT: Some feelings of bloating in the abdomen. Passage of more gas than usual.  Walking can help get rid of the air that was put into your GI tract during the procedure and reduce the bloating. If you had a lower endoscopy (such as a colonoscopy or flexible sigmoidoscopy) you may notice spotting of blood in your stool or on the toilet paper. If you underwent a bowel prep for your procedure, you may not have a normal bowel movement for a few days. ? ?Please Note:  You might notice some irritation and congestion in your nose or some drainage.  This is from the oxygen used during your procedure.  There is no need for concern and it should clear up in a day or so. ? ?SYMPTOMS TO REPORT IMMEDIATELY: ? ?Following lower endoscopy (colonoscopy or flexible sigmoidoscopy): ? Excessive amounts of blood in the stool ? Significant tenderness or worsening of abdominal pains ? Swelling of the abdomen that is new, acute ? Fever of 100?F or higher ? ?For urgent or emergent issues, a gastroenterologist can be reached at any hour by calling [phone]. ?Do not use MyChart messaging for urgent concerns.  ? ? ?DIET:  We do recommend a small meal at first, but then you may proceed to your regular diet.  Drink plenty of fluids but you should avoid alcoholic beverages for 24 hours. ? ?ACTIVITY:   You should plan to take it easy for the rest of today and you should NOT DRIVE or use heavy machinery until tomorrow (because of the sedation medicines used during the test).   ? ?FOLLOW UP: ?Our staff will call the number listed on your records 48-72 hours following your procedure to check on you and address any questions or concerns that you may have regarding the information given to you following your procedure. If we do not reach you, we will leave a message.  We will attempt to reach you two times.  During this call, we will ask if you have developed any symptoms of COVID 19. If you develop any symptoms (ie: fever, flu-like symptoms, shortness of breath, cough etc.) before then, please call [phone].  If you test positive for Covid 19 in the 2 weeks post procedure, please call and report this information to us.   ? ?If any biopsies were taken you will be contacted by phone or by letter within the next 1-3 weeks.  Please call us at [phone] if you have not heard about the biopsies in 3 weeks.  ? ? ?SIGNATURES/CONFIDENTIALITY: ?You and/or your care partner have signed paperwork which will be entered into your electronic medical record.  These signatures attest to the fact that that the information above on your After Visit Summary has been reviewed and is understood.  Full responsibility of the confidentiality of this discharge information lies with you   and/or your care-partner. ? ? ?

## 2021-12-25 NOTE — Progress Notes (Signed)
Called to room to assist during endoscopic procedure.  Patient ID and intended procedure confirmed with present staff. Received instructions for my participation in the procedure from the performing physician.  

## 2021-12-26 ENCOUNTER — Encounter: Payer: Self-pay | Admitting: Specialist

## 2021-12-26 ENCOUNTER — Ambulatory Visit: Payer: 59 | Admitting: Specialist

## 2021-12-30 ENCOUNTER — Telehealth: Payer: Self-pay

## 2021-12-30 ENCOUNTER — Telehealth: Payer: Self-pay | Admitting: *Deleted

## 2021-12-30 NOTE — Telephone Encounter (Signed)
?  Follow up Call- ? ? ?  12/25/2021  ?  2:50 PM 12/26/2020  ?  9:25 AM 06/12/2020  ?  8:45 AM  ?Call back number  ?Post procedure Call Back phone  # [phone]  ?Permission to leave phone message Yes Yes Yes  ?  ? ?Patient questions: ? ?Do you have a fever, pain , or abdominal swelling? No. ?Pain Score  0 * ? ?Have you tolerated food without any problems? Yes.   ? ?Have you been able to return to your normal activities? Yes.   ? ?Do you have any questions about your discharge instructions: ?Diet   No. ?Medications  No. ?Follow up visit  No. ? ?Do you have questions or concerns about your Care? No. ? ?Actions: ?* If pain score is 4 or above: ?No action needed, pain <4. ? ? ?

## 2021-12-30 NOTE — Telephone Encounter (Signed)
Left message on follow up call. 

## 2021-12-31 ENCOUNTER — Encounter: Payer: Self-pay | Admitting: Internal Medicine

## 2022-01-02 ENCOUNTER — Ambulatory Visit: Payer: Self-pay

## 2022-01-02 ENCOUNTER — Ambulatory Visit (INDEPENDENT_AMBULATORY_CARE_PROVIDER_SITE_OTHER): Payer: 59 | Admitting: Specialist

## 2022-01-02 ENCOUNTER — Encounter: Payer: Self-pay | Admitting: Specialist

## 2022-01-02 VITALS — BP 131/82 | HR 97 | Ht 61.0 in | Wt 243.0 lb

## 2022-01-02 DIAGNOSIS — M25551 Pain in right hip: Secondary | ICD-10-CM | POA: Diagnosis not present

## 2022-01-02 DIAGNOSIS — Z981 Arthrodesis status: Secondary | ICD-10-CM | POA: Diagnosis not present

## 2022-01-02 NOTE — Patient Instructions (Signed)
Plan: Avoid frequent bending and stooping  ?No lifting greater than 10 lbs. ?May use ice or moist heat for pain. ?Weight loss is of benefit. ?CT scan of the pelvis and the lumbar spine to assess the fusion site and hardware in the lumbar spine L4-5 and to assess an area of lucency right iliac wing seen on the CT scan done 10/12/2021.  ?Exercise is important to improve your indurance and does allow people to function better inspite of back pain. ?

## 2022-01-02 NOTE — Progress Notes (Signed)
? ?Office Visit Note ?  ?Patient: [name]           ?Date of Birth: July 31, 1971           ?MRN: [id number] ?Visit Date: 01/02/2022 ?             ?Requested by: [name], MD ?[address] ?[address] ?[place],  [place] [address] ?PCP: [name], MD ? ? ?Assessment & Plan: ?Visit Diagnoses:  ?1. S/P lumbar fusion   ?2. Pain of right hip   ? ? ?Plan: Avoid frequent bending and stooping  ?No lifting greater than 10 lbs. ?May use ice or moist heat for pain. ?Weight loss is of benefit. ?CT scan of the pelvis and the lumbar spine to assess the fusion site and hardware in the lumbar spine L4-5 and to assess an area of lucency right iliac wing seen on the CT scan done 10/12/2021.  ?Exercise is important to improve your indurance and does allow people to function better inspite of back pain. ?  ? ?Follow-Up Instructions: No follow-ups on file.  ? ?Orders:  ?Orders Placed This Encounter  ?Procedures  ? XR Lumbar Spine 2-3 Views  ? XR HIP UNILAT W OR W/O PELVIS 2-3 VIEWS RIGHT  ? ?No orders of the defined types were placed in this encounter. ? ? ? ? Procedures: ?No procedures performed ? ? ?Clinical Data: ?No additional findings. ? ? ?Subjective: ?Chief Complaint  ?Patient presents with  ? Lower Back - Follow-up  ? ? ?HPI 51 year old female post lumbar TLIF for DDD, spondylosis with spondylolisthesis. She is taking narcotics for ongoing pain management. Has an indwelling tracheostomy for tracheal stenosis ?Acute swelling associated with upper airway reactive lung disease. She underwent surgery in 07/2022 but has not had relief of pain expecially the discomfort she is experiencing in the right leg. The ?Pain is diffuse down the right leg into the right foot. No bowel or bladder difficulties. She is still taking oxycodone for pain and her studies thus far are not showing any sign of hardware loosening.  ?Pain is nearly constant and worsens with standing and walking.  ? ?Review of  Systems ? ? ?Objective: ?Vital Signs: BP 131/82   Pulse 97   Ht '5\' 1"'$  (1.549 m)   Wt 243 lb (110.2 kg)   LMP  (LMP Unknown)   BMI 45.91 kg/m?  ? ?Physical Exam ? ?Ortho Exam ? ?Specialty Comments:  ?No specialty comments available. ? ?Imaging: ?No results found. ? ? ?PMFS History: ?Patient Active Problem List  ? Diagnosis Date Noted  ? Spondylolisthesis, lumbar region 08/20/2021  ?  Priority: High  ?  Class: Chronic  ? Abnormal UGI series   ? Coffee ground emesis 10/15/2021  ? Gastritis and gastroduodenitis   ? Coffee ground vomiting 10/14/2021  ? Fusion of spine of lumbar region 08/20/2021  ? Chronic respiratory failure with hypoxia ([place]) 06/20/2021  ? Preop pulmonary/respiratory exam 06/20/2021  ? Pain of upper abdomen 11/28/2020  ? Recurrent vomiting 11/28/2020  ? COVID-19 09/16/2020  ? Dyspnea 08/26/2020  ? Rotator cuff tear 07/28/2019  ? Insulin dependent type 2 diabetes mellitus ([place]) 04/12/2019  ? Lactic acidosis 03/22/2019  ? Chronic right shoulder pain   ? Community acquired pneumonia 10/25/2018  ? Asthma, chronic, unspecified asthma severity, with acute exacerbation 10/25/2018  ? Acute respiratory failure with hypoxia and hypercapnia ([place]) 10/23/2018  ? Acute pain of right shoulder due to trauma 10/05/2018  ? Chronic bilateral low back  pain without sciatica 10/05/2018  ? Dysphonia 10/05/2018  ? Severe asthma with exacerbation 10/05/2018  ? Rotator cuff arthropathy, right 06/15/2018  ? AKI (acute kidney injury) ([place]) 03/06/2018  ? Carpal tunnel syndrome 01/18/2018  ? Heart failure ([place]) 05/20/2017  ? Normocytic anemia 05/06/2017  ? Tracheostomy dependent ([place]) 03/26/2017  ? Dysphagia 03/26/2017  ? Klebsiella cystitis 03/14/2017  ? Morbid obesity with BMI of 40.0-44.9, adult ([place]) 03/13/2017  ? Subglottic stenosis 03/06/2017  ? Asthma 03/06/2017  ? Chronic diastolic heart failure ([place]) 03/06/2017  ? Tracheal stenosis 03/04/2017  ? Acute blood loss anemia   ? Generalized anxiety disorder   ? Chronic pain  syndrome   ? GERD (gastroesophageal reflux disease) 02/22/2016  ? Depression 02/22/2016  ? Migraine 01/30/2015  ? Bulging lumbar disc 09/05/2013  ? Essential hypertension 09/05/2013  ? Allergic rhinitis, seasonal 08/11/2012  ? Chronic cough 08/11/2012  ? OSA (obstructive sleep apnea) 12/04/2011  ? ?Past Medical History:  ?Diagnosis Date  ? Allergy   ? Anxiety   ? Arthritis   ? Asthma   ? Chest pain of uncertain etiology 3/81/8299  ? Chronic back pain   ? Chronic chest pain   ? resolved, no problems since 2019 per patient 07/27/19  ? COPD (chronic obstructive pulmonary disease) ([place])   ? Depression   ? DM (diabetes mellitus) ([place])   ? INSULIN DEPENDENT - TYPE 1  ? Dyspnea   ? GERD (gastroesophageal reflux disease)   ? Headache(784.0)   ? History of hiatal hernia   ? Hyperlipidemia   ? no med, diet controlled  ? Hypertension   ? Hypokalemia   ? Pneumonia   ? Respiratory disease 05/2017  ? uses inhalers, neb tx prn, no oxygen  ? Sleep apnea   ? does not use CPAP due to trach  ? Status post tracheostomy ([place]) 03/14/2017  ? Last Assessment & Plan:  Formatting of this note might be different from the original. - [place] - On tach - ENT following along (please see above for current trach management plan)  ? Tracheostomy in place [facility]) 02/2017  ?  ?Family History  ?Problem Relation Age of Onset  ? Heart attack Mother   ? Stroke Mother   ? Diabetes Mother   ? Hypertension Mother   ? Arthritis Mother   ? Stroke Father   ? Asthma Sister   ? Hypertension Sister   ? Asthma Sister   ? Diabetes Sister   ? Asthma Brother   ? Seizures Brother   ? Asthma Brother   ? Diabetes Brother   ? Asthma Daughter   ? Asthma Son   ? Asthma Grandson   ? Colon cancer Neg Hx   ? Rectal cancer Neg Hx   ? Stomach cancer Neg Hx   ? Esophageal cancer Neg Hx   ? Ovarian cancer Neg Hx   ? Pancreatic cancer Neg Hx   ?  ?Past Surgical History:  ?Procedure Laterality Date  ? ABDOMINAL HYSTERECTOMY  2009  ? APPENDECTOMY     ? BIOPSY  10/15/2021  ? Procedure: BIOPSY;  Surgeon: Thornton Park, MD;  Location: [facility];  Service: Gastroenterology;;  ? CESAREAN SECTION    ? x 3  ? CHOLECYSTECTOMY N/A 03/02/2014  ? Procedure: LAPAROSCOPIC CHOLECYSTECTOMY;  Surgeon: Joyice Faster. Cornett, MD;  Location: [facility];  Service: General;  Laterality: N/A;  ? ESOPHAGOGASTRODUODENOSCOPY (EGD) WITH PROPOFOL N/A 10/15/2021  ? Procedure: ESOPHAGOGASTRODUODENOSCOPY (EGD) WITH PROPOFOL;  Surgeon:  Thornton Park, MD;  Location: [facility];  Service: Gastroenterology;  Laterality: N/A;  ? HERNIA REPAIR    ? PANENDOSCOPY N/A 03/04/2017  ? Procedure: PANENDOSCOPY WITH POSSIBLE FOREIGN BODY REMOVAL;  Surgeon: Jodi Marble, MD;  Location: [facility];  Service: ENT;  Laterality: N/A;  ? ROTATOR CUFF REPAIR Left   ? ROTATOR CUFF REPAIR Right 07/27/2019  ? SHOULDER ARTHROSCOPY WITH SUBACROMIAL DECOMPRESSION, ROTATOR CUFF REPAIR AND BICEP TENDON REPAIR Right 07/28/2019  ? Procedure: RIGHT SHOULDER ARTHROSCOP, MINI OPEN ROTATOR CUFF TEAR REPAIR,  BICEPS TENODESIS, DISTAL CLAVICLE EXCISION;  Surgeon: Meredith Pel, MD;  Location: [facility];  Service: Orthopedics;  Laterality: Right;  ? TOOTH EXTRACTION N/A 02/22/2021  ? Procedure: DENTAL RESTORATION/EXTRACTIONS;  Surgeon: Diona Browner, DMD;  Location: [facility];  Service: Oral Surgery;  Laterality: N/A;  ? TRACHEOSTOMY  02/2017  ? TUBAL LIGATION    ? VESICOVAGINAL FISTULA CLOSURE W/ TAH  2009  ? ?Social History  ? ?Occupational History  ? Not on file  ?Tobacco Use  ? Smoking status: Former  ?  Packs/day: 0.50  ?  Years: 25.00  ?  Pack years: 12.50  ?  Types: Cigarettes  ?  Quit date: 01/21/2016  ?  Years since quitting: 5.9  ? Smokeless tobacco: Never  ?Vaping Use  ? Vaping Use: Never used  ?Substance and Sexual Activity  ? Alcohol use: Yes  ?  Comment: social wine  ? Drug use: Not Currently  ?  Types: Marijuana  ?  Comment: couple times a month  ? Sexual activity: Yes  ?  Partners: Male  ?  Birth control/protection:  Surgical  ?  Comment: Hysterectomy  ? ? ? ? ? ? ?

## 2022-01-07 ENCOUNTER — Ambulatory Visit: Payer: 59

## 2022-01-10 ENCOUNTER — Other Ambulatory Visit: Payer: Self-pay | Admitting: Family Medicine

## 2022-01-10 DIAGNOSIS — F330 Major depressive disorder, recurrent, mild: Secondary | ICD-10-CM

## 2022-01-13 NOTE — Telephone Encounter (Signed)
Requested Prescriptions  ?Pending Prescriptions Disp Refills  ?? hydrOXYzine (ATARAX) 25 MG tablet [Pharmacy Med Name: hydrOXYzine HCl 25 MG Oral Tablet] 180 tablet 0  ?  Sig: TAKE 1 TABLET BY MOUTH THREE TIMES DAILY AS NEEDED FOR ANXIETY  ?  ? Ear, Nose, and Throat:  Antihistamines 2 Passed - 01/10/2022  6:06 PM  ?  ?  Passed - Cr in normal range and within 360 days  ?  Creat  ?Date Value Ref Range Status  ?08/01/2015 0.93 0.50 - 1.10 mg/dL Final  ? ?Creatinine, Ser  ?Date Value Ref Range Status  ?11/14/2021 0.88 0.57 - 1.00 mg/dL Final  ? ?Creatinine,U  ?Date Value Ref Range Status  ?01/30/2016 352.5 mg/dL Final  ?   ?  ?  Passed - Valid encounter within last 12 months  ?  Recent Outpatient Visits   ?      ? 3 weeks ago Encounter for [employer] annual wellness exam  ? [place], [place], MD  ? 2 months ago Hypertension associated with diabetes [facility])  ? [place], Charlane Ferretti, MD  ? 3 months ago Hyperlipidemia due to type 1 diabetes mellitus ([place])  ? [place], Charlane Ferretti, MD  ? 6 months ago Insulin dependent type 2 diabetes mellitus ([place])  ? [place], Charlane Ferretti, MD  ? 8 months ago Bulging lumbar disc  ? [place] [name], MD  ?  ?  ?Future Appointments   ?        ? In 1 week Jessy Oto, MD [place]  ?  ? ?  ?  ?  ? ?

## 2022-01-15 ENCOUNTER — Other Ambulatory Visit: Payer: Self-pay | Admitting: Family Medicine

## 2022-01-15 NOTE — Telephone Encounter (Signed)
Will forward to provider. Rx was dc on 09/24/21 ?

## 2022-01-15 NOTE — Telephone Encounter (Signed)
Dr. Margarita Rana would you be able to refuse medication for me.  ?

## 2022-01-16 ENCOUNTER — Ambulatory Visit
Admission: RE | Admit: 2022-01-16 | Discharge: 2022-01-16 | Disposition: A | Discharge status: Home / Self Care | Payer: 59 | Source: Ambulatory Visit | Attending: Specialist | Admitting: Specialist

## 2022-01-16 ENCOUNTER — Encounter: Payer: Self-pay | Admitting: Specialist

## 2022-01-16 DIAGNOSIS — M25551 Pain in right hip: Secondary | ICD-10-CM

## 2022-01-16 DIAGNOSIS — Z981 Arthrodesis status: Secondary | ICD-10-CM

## 2022-01-16 MED ORDER — GADOBENATE DIMEGLUMINE 529 MG/ML IV SOLN
20.0000 mL | Freq: Once | INTRAVENOUS | Status: AC | PRN
  Administered 2022-01-16: 20 mL via INTRAVENOUS

## 2022-01-16 NOTE — Progress Notes (Signed)
Disc protrusion is gone, no nerve compression remaining.

## 2022-01-17 ENCOUNTER — Other Ambulatory Visit: Payer: Self-pay | Admitting: Family Medicine

## 2022-01-24 ENCOUNTER — Ambulatory Visit (INDEPENDENT_AMBULATORY_CARE_PROVIDER_SITE_OTHER): Payer: 59 | Admitting: Specialist

## 2022-01-24 ENCOUNTER — Encounter: Payer: Self-pay | Admitting: Specialist

## 2022-01-24 VITALS — BP 145/100 | HR 99 | Ht 61.0 in | Wt 243.0 lb

## 2022-01-24 DIAGNOSIS — M4316 Spondylolisthesis, lumbar region: Secondary | ICD-10-CM

## 2022-01-24 DIAGNOSIS — Z981 Arthrodesis status: Secondary | ICD-10-CM

## 2022-01-24 DIAGNOSIS — M5410 Radiculopathy, site unspecified: Secondary | ICD-10-CM | POA: Diagnosis not present

## 2022-01-24 NOTE — Patient Instructions (Signed)
Avoid bending, stooping and avoid lifting weights greater than 10 lbs. Avoid prolong standing and walking. Avoid frequent bending and stooping  No lifting greater than 10 lbs. May use ice or moist heat for pain. Weight loss is of benefit. Handicap license is approved. Dr. Newton's secretary/Assistant will call to arrange for epidural steroid injection  

## 2022-01-24 NOTE — Progress Notes (Signed)
Low back pain been going on for a while, taking oxycodone and tylenol.  ?

## 2022-01-24 NOTE — Progress Notes (Signed)
? ?Office Visit Note ?  ?Patient: [name]           ?Date of Birth: 1971/05/04           ?MRN: [id number] ?Visit Date: 01/24/2022 ?             ?Requested by: [name], MD ?[address] ?[address] ?[place],  [place] [address] ?PCP: [name], MD ? ? ?Assessment & Plan: ?Visit Diagnoses:  ?1. S/P lumbar fusion   ?2. Spondylolisthesis of lumbar region   ?3. Radicular pain of right lower extremity   ?6 months post right L4-5 TLIF with radicular pain. MRI without definite compression. Her exam shows pain with leg raise on the right and mild weakness right quad. The findings suggest radiulitis and her right L4 and L5 roots are most likely cause. Will ask Dr. Ernestina Patches to perform right L4 TF ESI for block and for thear ? ?Plan: Avoid bending, stooping and avoid lifting weights greater than 10 lbs. ?Avoid prolong standing and walking. ?Avoid frequent bending and stooping  ?No lifting greater than 10 lbs. ?May use ice or moist heat for pain. ?Weight loss is of benefit. ?Handicap license is approved. ?Dr. Romona Curls secretary/Assistant will call to arrange for epidural steroid injection   ? ?Follow-Up Instructions: Return in about 4 weeks (around 02/21/2022).  ? ?Orders:  ?Orders Placed This Encounter  ?Procedures  ? Ambulatory referral to Physical Medicine Rehab  ? ?No orders of the defined types were placed in this encounter. ? ? ? ? Procedures: ?No procedures performed ? ? ?Clinical Data: ?No additional findings. ? ? ?Subjective: ?Chief Complaint  ?Patient presents with  ? Lower Back - Pain  ? ? ?51 year old female almost 6 months post op L4-5 TLIF for spondylolisthesis with DDD and foramenal narrowing. She has been in pain management. Reports that her pain is present with radiation into the right leg lateral thigh and calf. Underwent MRI  ?The results are showing no definite right sided nerve compression, there is some mild degenerative disc changed.  ? ? ?Review of Systems  ?Constitutional:  Negative.   ?HENT: Negative.    ?Eyes: Negative.   ?Respiratory: Negative.    ?Cardiovascular: Negative.   ?Gastrointestinal: Negative.   ?Endocrine: Negative.   ?Genitourinary: Negative.   ?Musculoskeletal: Negative.   ?Skin: Negative.   ?Allergic/Immunologic: Negative.   ?Neurological: Negative.   ?Hematological: Negative.   ?Psychiatric/Behavioral: Negative.    ? ? ?Objective: ?Vital Signs: BP (!) 145/100   Pulse 99   Ht '5\' 1"'$  (1.549 m)   Wt 243 lb (110.2 kg)   LMP  (LMP Unknown)   BMI 45.91 kg/m?  ? ?Physical Exam ?Constitutional:   ?   Appearance: She is well-developed.  ?HENT:  ?   Head: Normocephalic and atraumatic.  ?Eyes:  ?   Pupils: Pupils are equal, round, and reactive to light.  ?Pulmonary:  ?   Effort: Pulmonary effort is normal.  ?   Breath sounds: Normal breath sounds.  ?Abdominal:  ?   General: Bowel sounds are normal.  ?   Palpations: Abdomen is soft.  ?Musculoskeletal:  ?   Cervical back: Normal range of motion and neck supple.  ?   Lumbar back: Negative right straight leg raise test and negative left straight leg raise test.  ?Skin: ?   General: Skin is warm and dry.  ?Neurological:  ?   Mental Status: She is alert and oriented to person, place, and time.  ?Psychiatric:     ?  Behavior: Behavior normal.     ?   Thought Content: Thought content normal.     ?   Judgment: Judgment normal.  ? ? ?Back Exam  ? ?Tenderness  ?The patient is experiencing tenderness in the lumbar. ? ?Range of Motion  ?Extension:  abnormal  ?Flexion:  abnormal  ?Lateral bend right:  abnormal  ?Lateral bend left:  abnormal  ?Rotation right:  abnormal  ?Rotation left:  abnormal  ? ?Muscle Strength  ?Right Quadriceps:  4/5  ?Left Quadriceps:  5/5  ?Right Hamstrings:  5/5  ?Left Hamstrings:  5/5  ? ?Tests  ?Straight leg raise right: negative ?Straight leg raise left: negative ? ?Reflexes  ?Patellar:  0/4 ?Achilles:  2/4 ? ?Other  ?Erythema: no back redness ?Scars: present ? ? ? ? ?Specialty Comments:  ?No specialty  comments available. ? ?Imaging: ?No results found. ? ? ?PMFS History: ?Patient Active Problem List  ? Diagnosis Date Noted  ? Spondylolisthesis, lumbar region 08/20/2021  ?  Priority: High  ?  Class: Chronic  ? Abnormal UGI series   ? Coffee ground emesis 10/15/2021  ? Gastritis and gastroduodenitis   ? Coffee ground vomiting 10/14/2021  ? Fusion of spine of lumbar region 08/20/2021  ? Chronic respiratory failure with hypoxia ([place]) 06/20/2021  ? Preop pulmonary/respiratory exam 06/20/2021  ? Pain of upper abdomen 11/28/2020  ? Recurrent vomiting 11/28/2020  ? COVID-19 09/16/2020  ? Dyspnea 08/26/2020  ? Rotator cuff tear 07/28/2019  ? Insulin dependent type 2 diabetes mellitus ([place]) 04/12/2019  ? Lactic acidosis 03/22/2019  ? Chronic right shoulder pain   ? Community acquired pneumonia 10/25/2018  ? Asthma, chronic, unspecified asthma severity, with acute exacerbation 10/25/2018  ? Acute respiratory failure with hypoxia and hypercapnia ([place]) 10/23/2018  ? Acute pain of right shoulder due to trauma 10/05/2018  ? Chronic bilateral low back pain without sciatica 10/05/2018  ? Dysphonia 10/05/2018  ? Severe asthma with exacerbation 10/05/2018  ? Rotator cuff arthropathy, right 06/15/2018  ? AKI (acute kidney injury) ([place]) 03/06/2018  ? Carpal tunnel syndrome 01/18/2018  ? Heart failure ([place]) 05/20/2017  ? Normocytic anemia 05/06/2017  ? Tracheostomy dependent ([place]) 03/26/2017  ? Dysphagia 03/26/2017  ? Klebsiella cystitis 03/14/2017  ? Morbid obesity with BMI of 40.0-44.9, adult ([place]) 03/13/2017  ? Subglottic stenosis 03/06/2017  ? Asthma 03/06/2017  ? Chronic diastolic heart failure ([place]) 03/06/2017  ? Tracheal stenosis 03/04/2017  ? Acute blood loss anemia   ? Generalized anxiety disorder   ? Chronic pain syndrome   ? GERD (gastroesophageal reflux disease) 02/22/2016  ? Depression 02/22/2016  ? Migraine 01/30/2015  ? Bulging lumbar disc 09/05/2013  ? Essential hypertension 09/05/2013  ? Allergic rhinitis, seasonal  08/11/2012  ? Chronic cough 08/11/2012  ? OSA (obstructive sleep apnea) 12/04/2011  ? ?Past Medical History:  ?Diagnosis Date  ? Allergy   ? Anxiety   ? Arthritis   ? Asthma   ? Chest pain of uncertain etiology 3/76/2831  ? Chronic back pain   ? Chronic chest pain   ? resolved, no problems since 2019 per patient 07/27/19  ? COPD (chronic obstructive pulmonary disease) ([place])   ? Depression   ? DM (diabetes mellitus) ([place])   ? INSULIN DEPENDENT - TYPE 1  ? Dyspnea   ? GERD (gastroesophageal reflux disease)   ? Headache(784.0)   ? History of hiatal hernia   ? Hyperlipidemia   ? no med, diet controlled  ? Hypertension   ? Hypokalemia   ?  Pneumonia   ? Respiratory disease 05/2017  ? uses inhalers, neb tx prn, no oxygen  ? Sleep apnea   ? does not use CPAP due to trach  ? Status post tracheostomy ([place]) 03/14/2017  ? Last Assessment & Plan:  Formatting of this note might be different from the original. - [place] - On tach - ENT following along (please see above for current trach management plan)  ? Tracheostomy in place [facility]) 02/2017  ?  ?Family History  ?Problem Relation Age of Onset  ? Heart attack Mother   ? Stroke Mother   ? Diabetes Mother   ? Hypertension Mother   ? Arthritis Mother   ? Stroke Father   ? Asthma Sister   ? Hypertension Sister   ? Asthma Sister   ? Diabetes Sister   ? Asthma Brother   ? Seizures Brother   ? Asthma Brother   ? Diabetes Brother   ? Asthma Daughter   ? Asthma Son   ? Asthma Grandson   ? Colon cancer Neg Hx   ? Rectal cancer Neg Hx   ? Stomach cancer Neg Hx   ? Esophageal cancer Neg Hx   ? Ovarian cancer Neg Hx   ? Pancreatic cancer Neg Hx   ?  ?Past Surgical History:  ?Procedure Laterality Date  ? ABDOMINAL HYSTERECTOMY  2009  ? APPENDECTOMY    ? BIOPSY  10/15/2021  ? Procedure: BIOPSY;  Surgeon: Thornton Park, MD;  Location: [facility];  Service: Gastroenterology;;  ? CESAREAN SECTION    ? x 3  ? CHOLECYSTECTOMY N/A 03/02/2014  ? Procedure:  LAPAROSCOPIC CHOLECYSTECTOMY;  Surgeon: Joyice Faster. Cornett, MD;  Location: [facility];  Service: General;  Laterality: N/A;  ? ESOPHAGOGASTRODUODENOSCOPY (EGD) WITH PROPOFOL N/A 10/15/2021  ? Procedure: ESOPHAGOGASTRODUODENOSCOPY

## 2022-02-11 ENCOUNTER — Other Ambulatory Visit: Payer: Self-pay | Admitting: Family Medicine

## 2022-02-12 NOTE — Telephone Encounter (Signed)
Requested medications are due for refill today.  no  Requested medications are on the active medications list.  no  Last refill. 05/23/2021 #60 6 refills  Future visit scheduled.   no  Notes to clinic.  Medication refill or refusal is not delegated. Medication was discontinued 09/24/2021.    Requested Prescriptions  Pending Prescriptions Disp Refills   tiZANidine (ZANAFLEX) 4 MG tablet [Pharmacy Med Name: tiZANidine HCl 4 MG Oral Tablet] 60 tablet 0    Sig: TAKE 1 TABLET BY MOUTH EVERY 8 HOURS AS NEEDED FOR MUSCLE SPASMS.     Not Delegated - Cardiovascular:  Alpha-2 Agonists - tizanidine Failed - 02/11/2022  3:26 PM      Failed - This refill cannot be delegated      Passed - Valid encounter within last 6 months    Recent Outpatient Visits           1 month ago Encounter for [employer] annual wellness exam   [place], [place], MD   3 months ago Hypertension associated with diabetes [facility])   [place], Charlane Ferretti, MD   4 months ago Hyperlipidemia due to type 1 diabetes mellitus [facility])   [place], Charlane Ferretti, MD   7 months ago Insulin dependent type 2 diabetes mellitus [facility])   [place], Enobong, MD   9 months ago Bulging lumbar disc   [place], Enobong, MD       Future Appointments             In 2 weeks Louanne Skye, Daleen Bo, MD [place]

## 2022-02-14 ENCOUNTER — Other Ambulatory Visit: Payer: Self-pay | Admitting: Internal Medicine

## 2022-02-14 ENCOUNTER — Other Ambulatory Visit: Payer: Self-pay | Admitting: Family Medicine

## 2022-02-14 DIAGNOSIS — F330 Major depressive disorder, recurrent, mild: Secondary | ICD-10-CM

## 2022-02-14 DIAGNOSIS — I10 Essential (primary) hypertension: Secondary | ICD-10-CM

## 2022-02-27 ENCOUNTER — Encounter: Payer: Self-pay | Admitting: Specialist

## 2022-02-27 ENCOUNTER — Ambulatory Visit (INDEPENDENT_AMBULATORY_CARE_PROVIDER_SITE_OTHER): Payer: 59

## 2022-02-27 ENCOUNTER — Ambulatory Visit (INDEPENDENT_AMBULATORY_CARE_PROVIDER_SITE_OTHER): Payer: 59 | Admitting: Specialist

## 2022-02-27 VITALS — BP 182/129 | HR 114 | Ht 61.0 in | Wt 243.0 lb

## 2022-02-27 DIAGNOSIS — M4316 Spondylolisthesis, lumbar region: Secondary | ICD-10-CM | POA: Diagnosis not present

## 2022-02-27 DIAGNOSIS — M545 Low back pain, unspecified: Secondary | ICD-10-CM | POA: Diagnosis not present

## 2022-02-27 DIAGNOSIS — M5410 Radiculopathy, site unspecified: Secondary | ICD-10-CM

## 2022-02-27 DIAGNOSIS — G8929 Other chronic pain: Secondary | ICD-10-CM

## 2022-02-27 DIAGNOSIS — Z981 Arthrodesis status: Secondary | ICD-10-CM

## 2022-02-27 DIAGNOSIS — R531 Weakness: Secondary | ICD-10-CM

## 2022-02-27 DIAGNOSIS — E11610 Type 2 diabetes mellitus with diabetic neuropathic arthropathy: Secondary | ICD-10-CM

## 2022-02-27 NOTE — Progress Notes (Signed)
Office Visit Note   Patient: [name]           Date of Birth: March 22, 1971           MRN: [id number] Visit Date: 02/27/2022              Requested by: [name], MD [address],  [place] [address] PCP: [name], MD   Assessment & Plan: Visit Diagnoses:  1. Chronic low back pain, unspecified back pain laterality, unspecified whether sciatica present   2. S/P lumbar fusion   3. Spondylolisthesis of lumbar region   4. Radicular pain of right lower extremity   5. Weakness     Plan: Avoid frequent bending and stooping  No lifting greater than 10 lbs. May use ice or moist heat for pain. Weight loss is of benefit. Exercise is important to improve your indurance and does allow people to function better inspite of back pain.    Follow-Up Instructions: No follow-ups on file.   Orders:  Orders Placed This Encounter  Procedures   XR Lumbar Spine 2-3 Views   No orders of the defined types were placed in this encounter.     Procedures: No procedures performed   Clinical Data: No additional findings.   Subjective: Chief Complaint  Patient presents with   Lower Back - Follow-up    51 year old female with history of L4-5    Review of Systems   Objective: Vital Signs: BP (!) 182/129 (BP Location: Left Arm, Patient Position: Sitting, Cuff Size: Large)   Pulse (!) 114   Ht '5\' 1"'$  (1.549 m)   Wt 243 lb (110.2 kg)   LMP  (LMP Unknown)   BMI 45.91 kg/m   Physical Exam  Ortho Exam  Specialty Comments:  No specialty comments available.  Imaging: No results found.   PMFS History: Patient Active Problem List   Diagnosis Date Noted   Spondylolisthesis, lumbar region 08/20/2021    Priority: High    Class: Chronic   Abnormal UGI series    Coffee ground emesis 10/15/2021   Gastritis and gastroduodenitis    Coffee ground vomiting 10/14/2021   Fusion of spine of lumbar region 08/20/2021   Chronic  respiratory failure with hypoxia ([place]) 06/20/2021   Preop pulmonary/respiratory exam 06/20/2021   Pain of upper abdomen 11/28/2020   Recurrent vomiting 11/28/2020   COVID-19 09/16/2020   Dyspnea 08/26/2020   Rotator cuff tear 07/28/2019   Insulin dependent type 2 diabetes mellitus ([place]) 04/12/2019   Lactic acidosis 03/22/2019   Chronic right shoulder pain    Community acquired pneumonia 10/25/2018   Asthma, chronic, unspecified asthma severity, with acute exacerbation 10/25/2018   Acute respiratory failure with hypoxia and hypercapnia ([place]) 10/23/2018   Acute pain of right shoulder due to trauma 10/05/2018   Chronic bilateral low back pain without sciatica 10/05/2018   Dysphonia 10/05/2018   Severe asthma with exacerbation 10/05/2018   Rotator cuff arthropathy, right 06/15/2018   AKI (acute kidney injury) ([place]) 03/06/2018   Carpal tunnel syndrome 01/18/2018   Heart failure ([place]) 05/20/2017   Normocytic anemia 05/06/2017   Tracheostomy dependent ([place]) 03/26/2017   Dysphagia 03/26/2017   Klebsiella cystitis 03/14/2017   Morbid obesity with BMI of 40.0-44.9, adult ([place]) 03/13/2017   Subglottic stenosis 03/06/2017   Asthma 03/06/2017   Chronic diastolic heart failure ([place]) 03/06/2017   Tracheal stenosis 03/04/2017   Acute blood loss anemia    Generalized anxiety disorder  Chronic pain syndrome    GERD (gastroesophageal reflux disease) 02/22/2016   Depression 02/22/2016   Migraine 01/30/2015   Bulging lumbar disc 09/05/2013   Essential hypertension 09/05/2013   Allergic rhinitis, seasonal 08/11/2012   Chronic cough 08/11/2012   OSA (obstructive sleep apnea) 12/04/2011   Past Medical History:  Diagnosis Date   Allergy    Anxiety    Arthritis    Asthma    Chest pain of uncertain etiology 1/85/6314   Chronic back pain    Chronic chest pain    resolved, no problems since 2019 per patient 07/27/19   COPD (chronic obstructive pulmonary disease) ([place])    Depression    DM  (diabetes mellitus) ([place])    INSULIN DEPENDENT - TYPE 1   Dyspnea    GERD (gastroesophageal reflux disease)    Headache(784.0)    History of hiatal hernia    Hyperlipidemia    no med, diet controlled   Hypertension    Hypokalemia    Pneumonia    Respiratory disease 05/2017   uses inhalers, neb tx prn, no oxygen   Sleep apnea    does not use CPAP due to trach   Status post tracheostomy ([place]) 03/14/2017   Last Assessment & Plan:  Formatting of this note might be different from the original. - [place] - On tach - ENT following along (please see above for current trach management plan)   Tracheostomy in place [facility]) 02/2017    Family History  Problem Relation Age of Onset   Heart attack Mother    Stroke Mother    Diabetes Mother    Hypertension Mother    Arthritis Mother    Stroke Father    Asthma Sister    Hypertension Sister    Asthma Sister    Diabetes Sister    Asthma Brother    Seizures Brother    Asthma Brother    Diabetes Brother    Asthma Daughter    Asthma Son    Asthma Grandson    Colon cancer Neg Hx    Rectal cancer Neg Hx    Stomach cancer Neg Hx    Esophageal cancer Neg Hx    Ovarian cancer Neg Hx    Pancreatic cancer Neg Hx     Past Surgical History:  Procedure Laterality Date   ABDOMINAL HYSTERECTOMY  2009   APPENDECTOMY     BIOPSY  10/15/2021   Procedure: BIOPSY;  Surgeon: Thornton Park, MD;  Location: [facility];  Service: Gastroenterology;;   CESAREAN SECTION     x 3   CHOLECYSTECTOMY N/A 03/02/2014   Procedure: LAPAROSCOPIC CHOLECYSTECTOMY;  Surgeon: Joyice Faster. Cornett, MD;  Location: [facility];  Service: General;  Laterality: N/A;   ESOPHAGOGASTRODUODENOSCOPY (EGD) WITH PROPOFOL N/A 10/15/2021   Procedure: ESOPHAGOGASTRODUODENOSCOPY (EGD) WITH PROPOFOL;  Surgeon: Thornton Park, MD;  Location: [facility];  Service: Gastroenterology;  Laterality: N/A;   HERNIA REPAIR     PANENDOSCOPY N/A 03/04/2017    Procedure: PANENDOSCOPY WITH POSSIBLE FOREIGN BODY REMOVAL;  Surgeon: Jodi [place], MD;  Location: [place];  Service: ENT;  Laterality: N/A;   ROTATOR CUFF REPAIR Left    ROTATOR CUFF REPAIR Right 07/27/2019   SHOULDER ARTHROSCOPY WITH SUBACROMIAL DECOMPRESSION, ROTATOR CUFF REPAIR AND BICEP TENDON REPAIR Right 07/28/2019   Procedure: RIGHT SHOULDER ARTHROSCOP, MINI OPEN ROTATOR CUFF TEAR REPAIR,  BICEPS TENODESIS, DISTAL CLAVICLE EXCISION;  Surgeon: Meredith Pel, MD;  Location: [facility];  Service: Orthopedics;  Laterality: Right;   TOOTH EXTRACTION N/A 02/22/2021   Procedure: DENTAL RESTORATION/EXTRACTIONS;  Surgeon: Diona Browner, DMD;  [facility];  Service: Oral Surgery;  Laterality: N/A;   TRACHEOSTOMY  02/2017   TUBAL LIGATION     VESICOVAGINAL FISTULA CLOSURE W/ TAH  2009   Social History   Occupational History   Not on file  Tobacco Use   Smoking status: Former    Packs/day: 0.50    Years: 25.00    Total pack years: 12.50    Types: Cigarettes    Quit date: 01/21/2016    Years since quitting: 6.1   Smokeless tobacco: Never  Vaping Use   Vaping Use: Never used  Substance and Sexual Activity   Alcohol use: Yes    Comment: social wine   Drug use: Not Currently    Types: Marijuana    Comment: couple times a month   Sexual activity: Yes    Partners: Male    Birth control/protection: Surgical    Comment: Hysterectomy

## 2022-02-27 NOTE — Patient Instructions (Signed)
Plan: Avoid frequent bending and stooping  No lifting greater than 10 lbs. May use ice or moist heat for pain. Weight loss is of benefit. Exercise is important to improve your indurance and does allow people to function better inspite of back pain. CT lumbar spine to assess healing of fusion. Vasc study of legs due to claudication left leg pain and history of HTN and diabetes.

## 2022-03-03 ENCOUNTER — Encounter: Payer: 59 | Admitting: Physical Medicine and Rehabilitation

## 2022-03-03 LAB — HM DIABETES EYE EXAM

## 2022-03-05 ENCOUNTER — Other Ambulatory Visit: Payer: Self-pay | Admitting: Family Medicine

## 2022-03-06 NOTE — Telephone Encounter (Signed)
Requested medication (s) are due for refill today: Discontinued  Requested medication (s) are on the active medication list: no    Last refill: LRF 05/23/21    #60  6refills  Future visit scheduled no  Notes to clinic:Discontinued 09/24/21. Cannot refuse non delegated meds.per protocol.  Requested Prescriptions  Pending Prescriptions Disp Refills   tiZANidine (ZANAFLEX) 4 MG tablet [Pharmacy Med Name: tiZANidine HCl 4 MG Oral Tablet] 60 tablet 0    Sig: TAKE 1 TABLET BY MOUTH EVERY 8 HOURS AS NEEDED FOR MUSCLE SPASMS.     Not Delegated - Cardiovascular:  Alpha-2 Agonists - tizanidine Failed - 03/05/2022 11:51 PM      Failed - This refill cannot be delegated      Passed - Valid encounter within last 6 months    Recent Outpatient Visits           2 months ago Encounter for Medicare annual wellness exam   [place], [place], MD   3 months ago Hypertension associated with diabetes [facility])   [place], Charlane Ferretti, MD   5 months ago Hyperlipidemia due to type 1 diabetes mellitus [facility])   [place], Charlane Ferretti, MD   8 months ago Insulin dependent type 2 diabetes mellitus [facility])   [place], Enobong, MD   10 months ago Bulging lumbar disc   [place] [facility] [name], MD

## 2022-03-10 ENCOUNTER — Encounter (HOSPITAL_COMMUNITY): Payer: 59

## 2022-03-12 ENCOUNTER — Encounter: Payer: Self-pay | Admitting: Family Medicine

## 2022-03-12 ENCOUNTER — Other Ambulatory Visit: Payer: Self-pay | Admitting: Family Medicine

## 2022-03-12 MED ORDER — TIZANIDINE HCL 4 MG PO TABS: 4.0000 mg | ORAL_TABLET | Freq: Three times a day (TID) | ORAL | 3 refills | Status: DC | PRN

## 2022-03-13 ENCOUNTER — Other Ambulatory Visit: Payer: 59

## 2022-03-14 ENCOUNTER — Encounter (HOSPITAL_COMMUNITY): Payer: 59

## 2022-03-20 ENCOUNTER — Ambulatory Visit (HOSPITAL_COMMUNITY)
Admission: RE | Admit: 2022-03-20 | Discharge: 2022-03-20 | Disposition: A | Discharge status: Home / Self Care | Payer: 59 | Source: Ambulatory Visit | Attending: Specialist | Admitting: Specialist

## 2022-03-20 DIAGNOSIS — R531 Weakness: Secondary | ICD-10-CM | POA: Insufficient documentation

## 2022-03-20 DIAGNOSIS — M5410 Radiculopathy, site unspecified: Secondary | ICD-10-CM | POA: Diagnosis present

## 2022-03-20 DIAGNOSIS — E11610 Type 2 diabetes mellitus with diabetic neuropathic arthropathy: Secondary | ICD-10-CM | POA: Diagnosis present

## 2022-03-20 IMAGING — RF DG LUMBAR SPINE COMPLETE 4+V
1 series · 4 of 4 positions shown · non-contrast
Comparison: 04/01/2021

FLUOROSCOPY TIME:  1 minute 14 seconds

Dose: 58.78 mGy

Images: 4

CLINICAL DATA: L4-L5 TLIF

EXAM:
LUMBAR SPINE - COMPLETE 4+ VIEW

[Series 1: run · 4 of 4 slices shown]
[im 1/4]
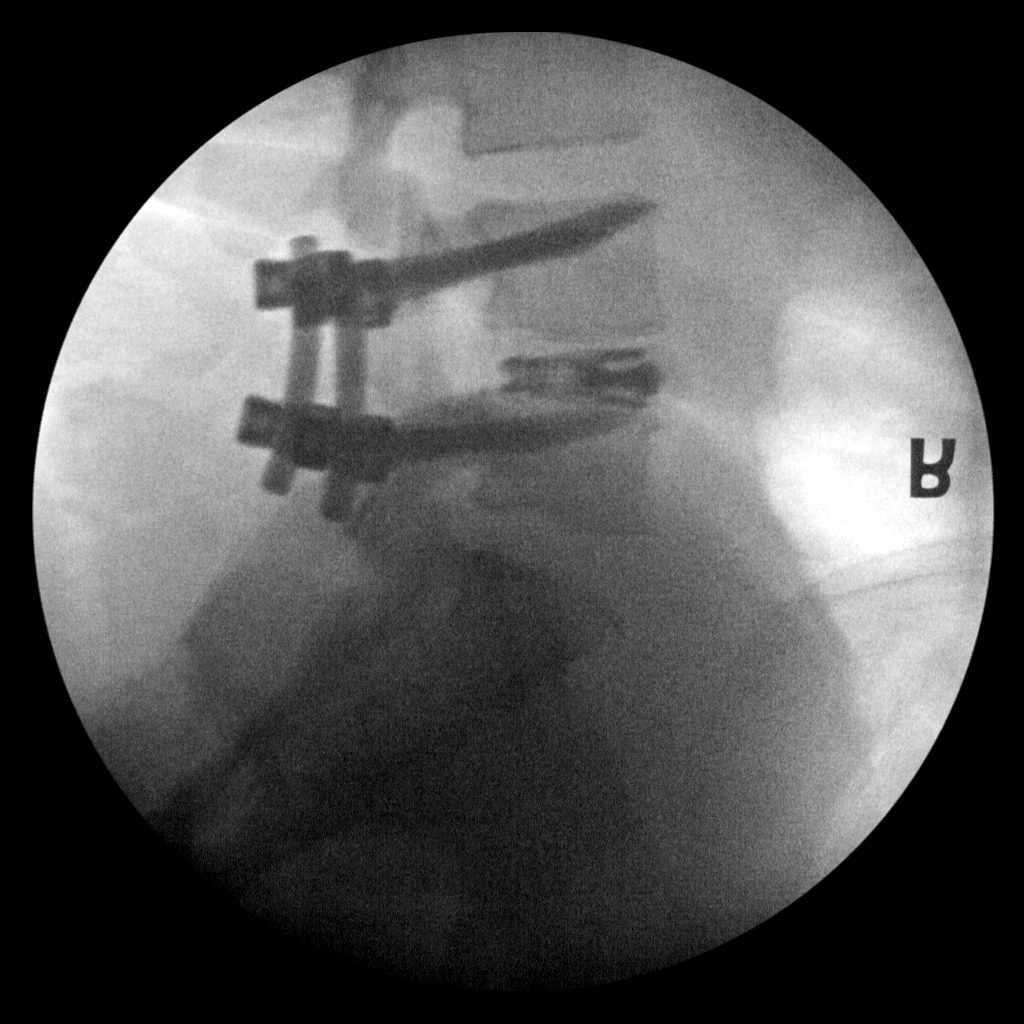
[im 2/4]
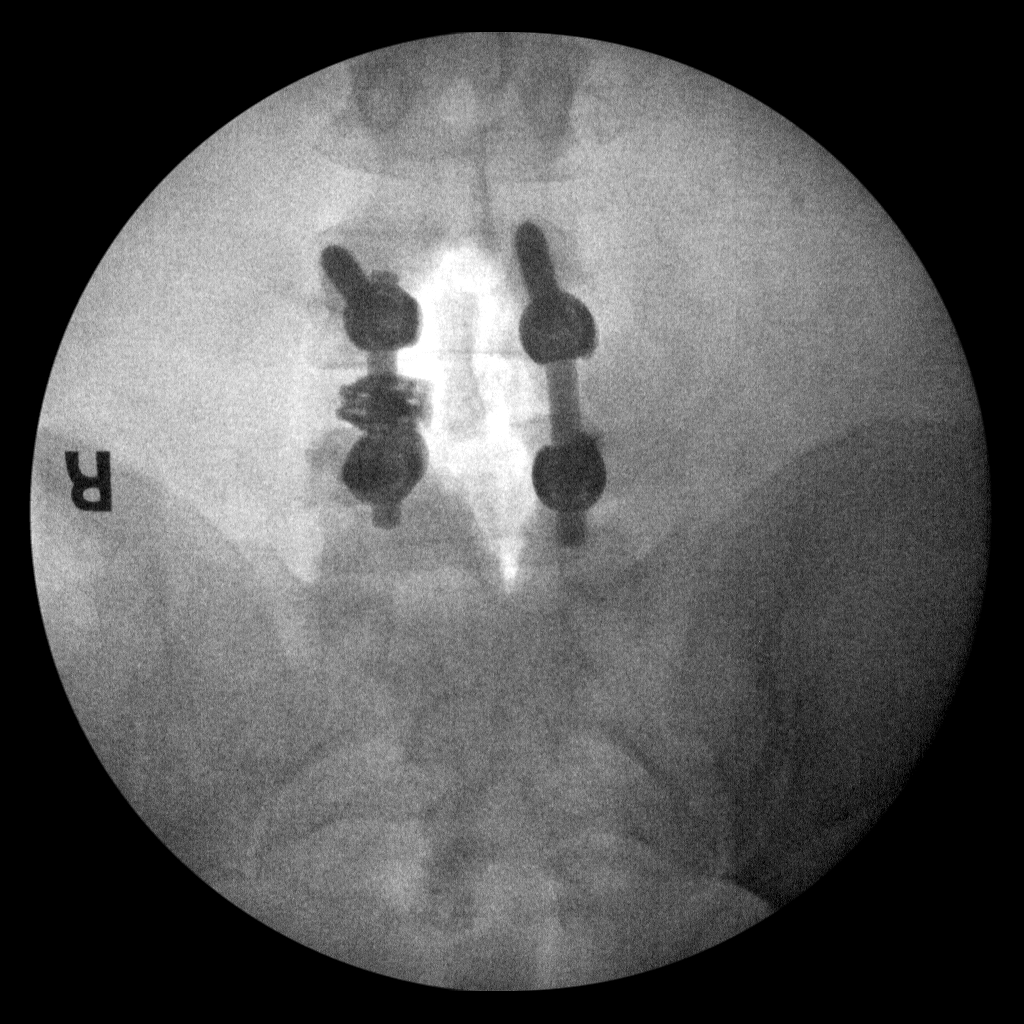
[im 3/4]
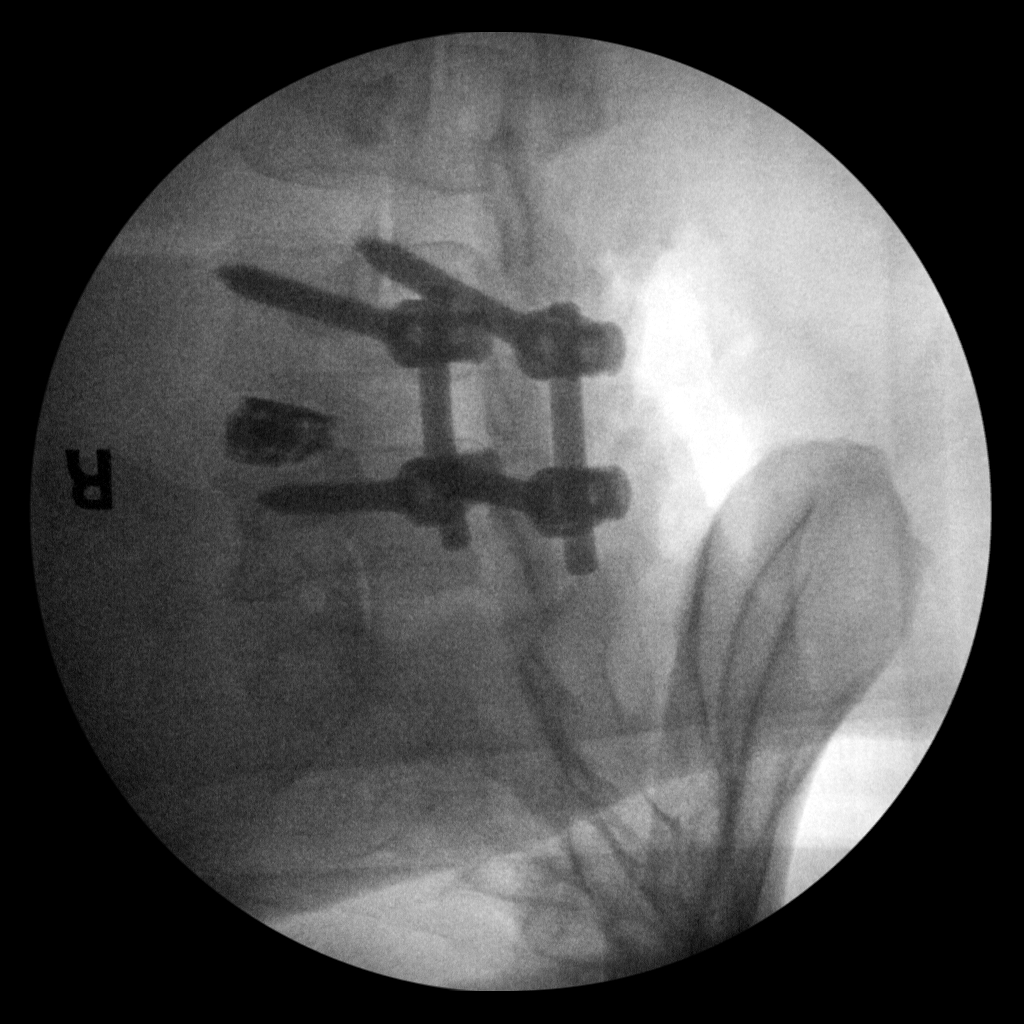
[im 4/4]
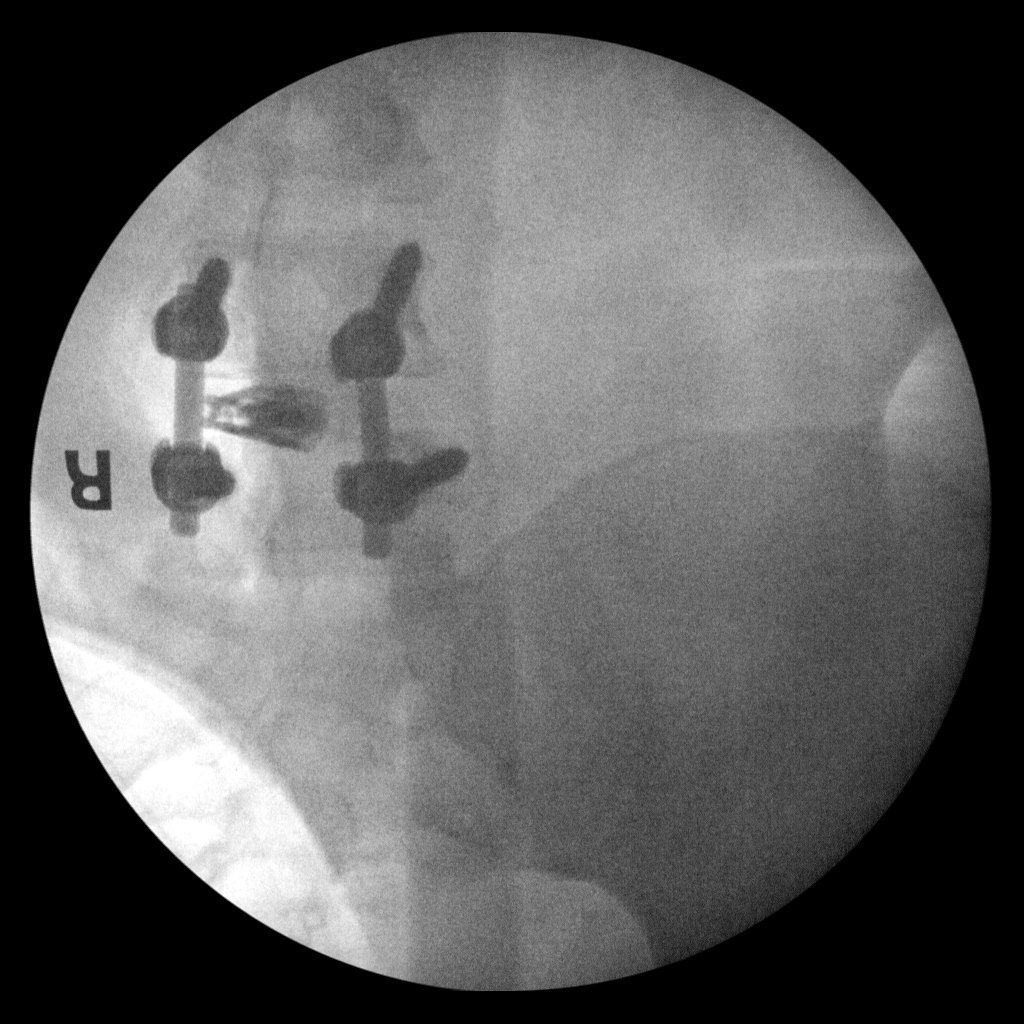

[4 of 4 positions shown; findings below may reference images not displayed]

FINDINGS: 5 lumbar vertebra on prior exam.

Current images demonstrate placement of BILATERAL pedicle screws and
posterior bars at L4-L5 with intervening disc prosthesis.

No fracture or subluxation.
IMPRESSION: Posterior fusion L4-L5.

## 2022-03-22 IMAGING — DX DG CHEST 1V PORT
1 series · 1 of 1 positions shown · non-contrast
Comparison: 04/01/2021

CLINICAL DATA: Dyspnea

EXAM:
PORTABLE CHEST 1 VIEW

[chest]
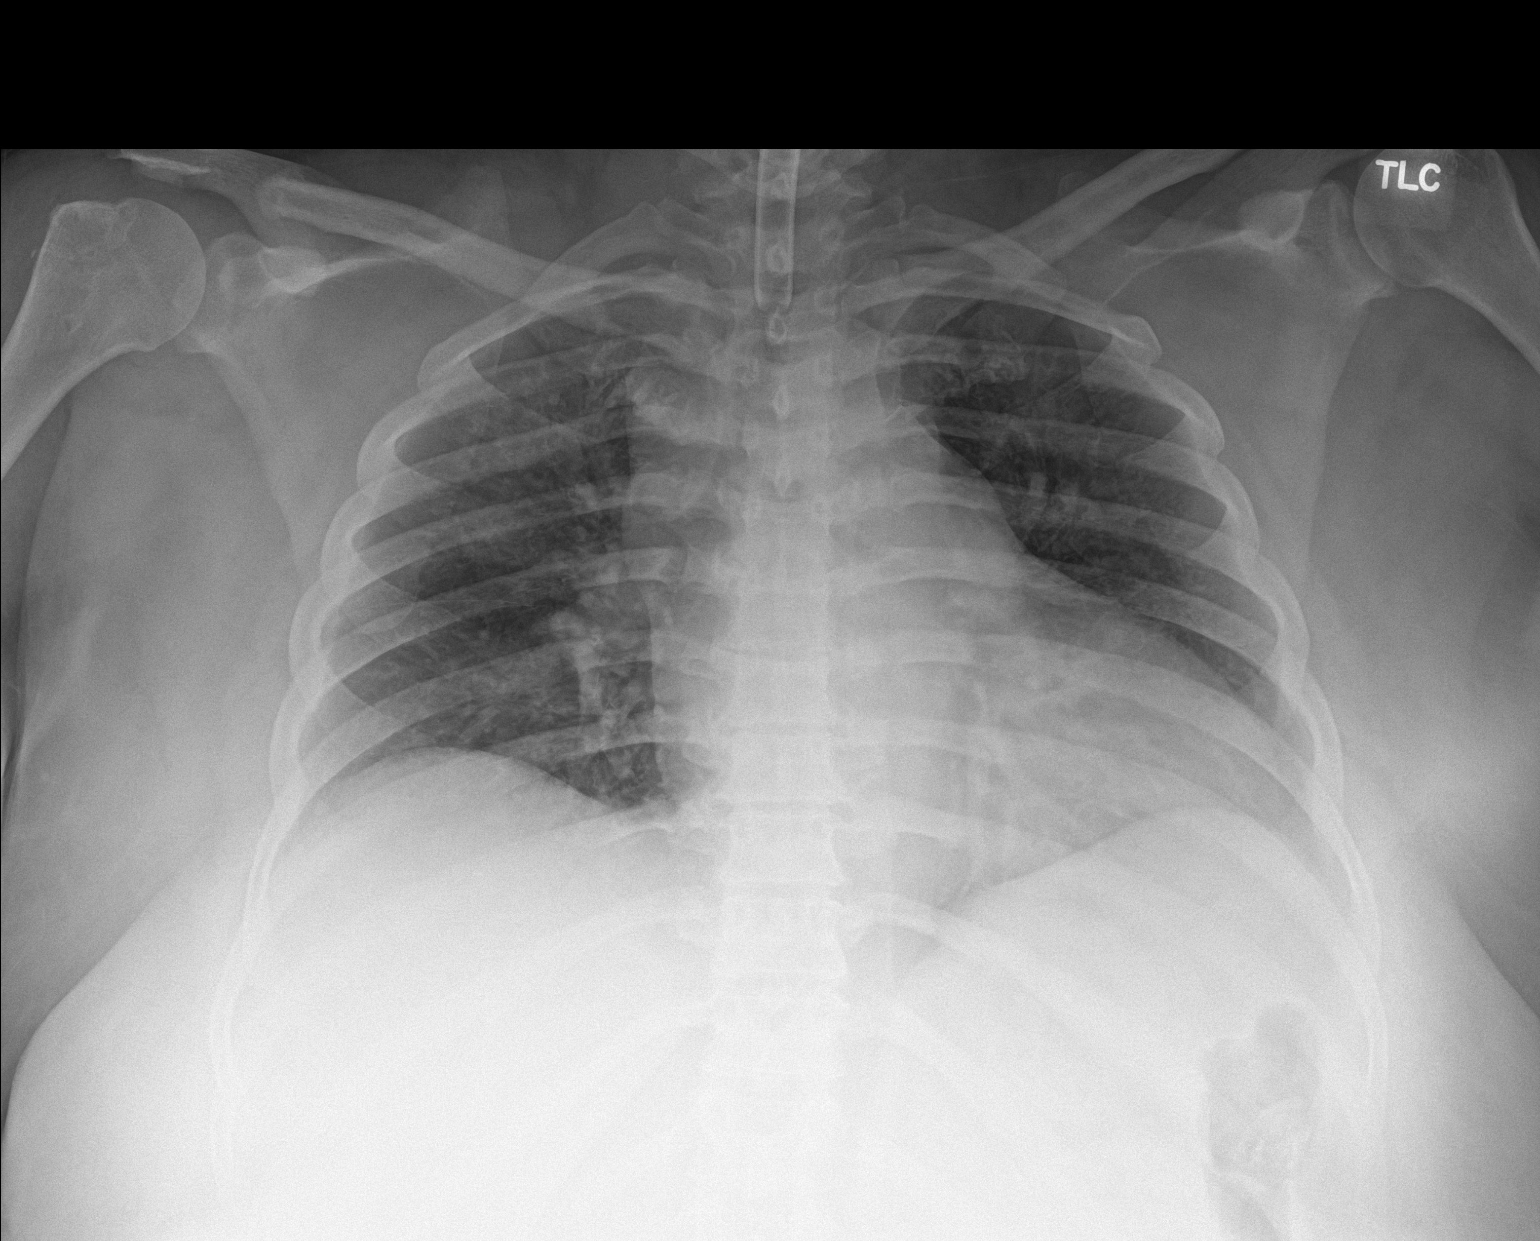

[1 of 1 positions shown; findings below may reference images not displayed]

FINDINGS: Tracheostomy tube in good position. Normal cardiac silhouette. No
effusion, infiltrate or pneumothorax. Low lung volumes.
IMPRESSION: 1. Tracheostomy tube in good position.
2.  No acute cardiopulmonary process.

## 2022-03-28 ENCOUNTER — Telehealth: Payer: Self-pay | Admitting: Specialist

## 2022-03-28 NOTE — Telephone Encounter (Signed)
Called patient left message to return call to schedule an appointment for MRI review with Dr. Louanne Skye

## 2022-04-02 ENCOUNTER — Encounter: Payer: Self-pay | Admitting: Physical Medicine and Rehabilitation

## 2022-04-02 ENCOUNTER — Ambulatory Visit (INDEPENDENT_AMBULATORY_CARE_PROVIDER_SITE_OTHER): Payer: 59 | Admitting: Physical Medicine and Rehabilitation

## 2022-04-02 ENCOUNTER — Ambulatory Visit: Payer: Self-pay

## 2022-04-02 VITALS — BP 164/112 | HR 108

## 2022-04-02 DIAGNOSIS — M5416 Radiculopathy, lumbar region: Secondary | ICD-10-CM

## 2022-04-02 DIAGNOSIS — M961 Postlaminectomy syndrome, not elsewhere classified: Secondary | ICD-10-CM

## 2022-04-02 DIAGNOSIS — Z981 Arthrodesis status: Secondary | ICD-10-CM

## 2022-04-02 MED ORDER — METHYLPREDNISOLONE ACETATE 80 MG/ML IJ SUSP
80.0000 mg | Freq: Once | INTRAMUSCULAR | Status: AC
  Administered 2022-04-02: 80 mg

## 2022-04-02 NOTE — Patient Instructions (Signed)

## 2022-04-02 NOTE — Progress Notes (Signed)
Pt state lower back pain that travels down her right leg. Pt state walking, standing and laying down makes the pain worse. Pt state she takes pain meds to help ease her pain.  Numeric Pain Rating Scale and Functional Assessment Average Pain 8   In the last MONTH (on 0-10 scale) has pain interfered with the following?  1. General activity like being  able to carry out your everyday physical activities such as walking, climbing stairs, carrying groceries, or moving a chair?  Rating(10)   +Driver, -BT, -Dye Allergies.

## 2022-04-04 ENCOUNTER — Other Ambulatory Visit: Payer: Self-pay | Admitting: Family Medicine

## 2022-04-04 DIAGNOSIS — F330 Major depressive disorder, recurrent, mild: Secondary | ICD-10-CM

## 2022-04-04 NOTE — Telephone Encounter (Signed)
Requested Prescriptions  Pending Prescriptions Disp Refills  . sertraline (ZOLOFT) 100 MG tablet [Pharmacy Med Name: Sertraline HCl 100 MG Oral Tablet] 90 tablet 0    Sig: TAKE 1 TABLET BY MOUTH AT BEDTIME     Psychiatry:  Antidepressants - SSRI - sertraline Passed - 04/04/2022  9:58 AM      Passed - AST in normal range and within 360 days    AST  Date Value Ref Range Status  11/01/2021 24 15 - 41 U/L Final         Passed - ALT in normal range and within 360 days    ALT  Date Value Ref Range Status  11/01/2021 18 0 - 44 U/L Final         Passed - Completed PHQ-2 or PHQ-9 in the last 360 days      Passed - Valid encounter within last 6 months    Recent Outpatient Visits          3 months ago Encounter for [employer] annual wellness exam   [place], [place], MD   4 months ago Hypertension associated with diabetes [facility])   [place], [place], MD   6 months ago Hyperlipidemia due to type 1 diabetes mellitus ([place])   [place], [place], MD   9 months ago Insulin dependent type 2 diabetes mellitus [facility])   [place], Enobong, MD   11 months ago Bulging lumbar disc   [place] [name], MD

## 2022-04-08 ENCOUNTER — Encounter: Payer: Self-pay | Admitting: Family Medicine

## 2022-04-08 ENCOUNTER — Ambulatory Visit: Payer: 59 | Attending: Family Medicine | Admitting: Family Medicine

## 2022-04-08 VITALS — BP 162/109 | HR 97 | Temp 98.7°F | Ht 61.0 in | Wt 247.4 lb

## 2022-04-08 DIAGNOSIS — E1069 Type 1 diabetes mellitus with other specified complication: Secondary | ICD-10-CM | POA: Diagnosis not present

## 2022-04-08 DIAGNOSIS — N951 Menopausal and female climacteric states: Secondary | ICD-10-CM

## 2022-04-08 DIAGNOSIS — I152 Hypertension secondary to endocrine disorders: Secondary | ICD-10-CM

## 2022-04-08 DIAGNOSIS — G43119 Migraine with aura, intractable, without status migrainosus: Secondary | ICD-10-CM

## 2022-04-08 DIAGNOSIS — M5136 Other intervertebral disc degeneration, lumbar region: Secondary | ICD-10-CM

## 2022-04-08 DIAGNOSIS — K219 Gastro-esophageal reflux disease without esophagitis: Secondary | ICD-10-CM

## 2022-04-08 DIAGNOSIS — E785 Hyperlipidemia, unspecified: Secondary | ICD-10-CM

## 2022-04-08 DIAGNOSIS — E1159 Type 2 diabetes mellitus with other circulatory complications: Secondary | ICD-10-CM

## 2022-04-08 DIAGNOSIS — F330 Major depressive disorder, recurrent, mild: Secondary | ICD-10-CM

## 2022-04-08 LAB — POCT GLYCOSYLATED HEMOGLOBIN (HGB A1C): HbA1c, POC (controlled diabetic range): 10 % — AB (ref 0.0–7.0)

## 2022-04-08 LAB — GLUCOSE, POCT (MANUAL RESULT ENTRY): POC Glucose: 157 mg/dl — AB (ref 70–99)

## 2022-04-08 MED ORDER — KETOROLAC TROMETHAMINE 60 MG/2ML IM SOLN
60.0000 mg | Freq: Once | INTRAMUSCULAR | Status: AC
  Administered 2022-04-08: 60 mg via INTRAMUSCULAR

## 2022-04-08 MED ORDER — GABAPENTIN 300 MG PO CAPS
300.0000 mg | ORAL_CAPSULE | Freq: Two times a day (BID) | ORAL | 1 refills | Status: DC
Start: 2022-04-08 — End: 2022-06-16

## 2022-04-08 MED ORDER — SERTRALINE HCL 100 MG PO TABS: 100.0000 mg | ORAL_TABLET | Freq: Every day | ORAL | 1 refills | Status: DC

## 2022-04-08 MED ORDER — SEMAGLUTIDE (1 MG/DOSE) 4 MG/3ML ~~LOC~~ SOPN: 1.0000 mg | PEN_INJECTOR | SUBCUTANEOUS | 0 refills | Status: DC

## 2022-04-08 MED ORDER — PANTOPRAZOLE SODIUM 40 MG PO TBEC
40.0000 mg | DELAYED_RELEASE_TABLET | Freq: Two times a day (BID) | ORAL | 3 refills | Status: DC
Start: 2022-04-08 — End: 2022-07-09

## 2022-04-08 MED ORDER — BUPROPION HCL ER (SR) 150 MG PO TB12: 150.0000 mg | ORAL_TABLET | Freq: Two times a day (BID) | ORAL | 1 refills | Status: DC

## 2022-04-08 MED ORDER — AMITRIPTYLINE HCL 10 MG PO TABS: 10.0000 mg | ORAL_TABLET | Freq: Every day | ORAL | 3 refills | Status: DC

## 2022-04-08 MED ORDER — HYDROXYZINE HCL 25 MG PO TABS: 25.0000 mg | ORAL_TABLET | Freq: Three times a day (TID) | ORAL | 1 refills | Status: DC | PRN

## 2022-04-08 MED ORDER — DAPAGLIFLOZIN PROPANEDIOL 5 MG PO TABS: 5.0000 mg | ORAL_TABLET | Freq: Every day | ORAL | 1 refills | Status: DC

## 2022-04-08 MED ORDER — FUROSEMIDE 20 MG PO TABS: 20.0000 mg | ORAL_TABLET | Freq: Every day | ORAL | 2 refills | Status: DC

## 2022-04-08 MED ORDER — SEMAGLUTIDE (2 MG/DOSE) 8 MG/3ML ~~LOC~~ SOPN: 2.0000 mg | PEN_INJECTOR | SUBCUTANEOUS | 6 refills | Status: DC

## 2022-04-08 MED ORDER — LISINOPRIL 20 MG PO TABS: 20.0000 mg | ORAL_TABLET | Freq: Every day | ORAL | 1 refills | Status: DC

## 2022-04-08 NOTE — Progress Notes (Signed)
Hot flashes Mood swings. Has not had any pain medication

## 2022-04-08 NOTE — Progress Notes (Signed)
Subjective:  Patient ID: [name], female    DOB: October 16, 1970  Age: 51 y.o. MRN: [id number]  CC: Hypertension   [name] is a 51 y.o. year old female with a history of type 1 diabetes mellitus (A1c 10.0), hypertension, obstructive sleep apnea, asthma, depression, s/p tracheostomy secondary to intubation related tracheal injury, subglottic stenosis (status post balloon dilatation and Kenalog injection of stenosis tracheostomy tube exchange by ENT, status post right rotator cuff surgery in 07/2019. Uses 2L oxygen at night, s/p R L4L5 transforaminal lumbar interbody fusion with rods, screws and cage, local bone graft, allogen bone graft, vivigen in 09/2021.  Interval History: She last had a diabetic visit with her endocrinology Dr. Loanne Bradley in 11/2021.  A1c is 10.0 from 8.0 previously. She states her Endocrinologist left his practice but she endorses adherence with her medications.  She complains of hot flashes which occurs during the day and night, it irritates her and she has mood swings. She cries and snaps easily.  Also endorses presence of vaginal dryness and hypoactive sexual disorder.  She is currently on Wellbutrin, Zoloft and is also on gabapentin.  Her pain medications and her daughter's medications got mixed up and discussed the problem at her pain clinic.  She has been unable to receive her narcotic analgesics but has an upcoming appointment with [facility] pain management in 2 days. Her pain is uncontrolled and this is causing her blood pressure to be elevated.  States her asthma has been controlled. Migraines have worsened since she ran out of her opiate analgesic but she states she is doing better on amitriptyline than she did with Topamax. Past Medical History:  Diagnosis Date   Allergy    Anxiety    Arthritis    Asthma    Chest pain of uncertain etiology 11/05/863   Chronic back pain    Chronic chest pain    resolved, no problems since  2019 per patient 07/27/19   COPD (chronic obstructive pulmonary disease) ([place])    Depression    DM (diabetes mellitus) ([place])    INSULIN DEPENDENT - TYPE 1   Dyspnea    GERD (gastroesophageal reflux disease)    Headache(784.0)    History of hiatal hernia    Hyperlipidemia    no med, diet controlled   Hypertension    Hypokalemia    Pneumonia    Respiratory disease 05/2017   uses inhalers, neb tx prn, no oxygen   Sleep apnea    does not use CPAP due to trach   Status post tracheostomy ([place]) 03/14/2017   Last Assessment & Plan:  Formatting of this note might be different from the original. - [place] - On tach - ENT following along (please see above for current trach management plan)   Tracheostomy in place [facility]) 02/2017    Past Surgical History:  Procedure Laterality Date   ABDOMINAL HYSTERECTOMY  2009   APPENDECTOMY     BIOPSY  10/15/2021   Procedure: BIOPSY;  Surgeon: Brittney Park, MD;  Location: [place];  Service: Gastroenterology;;   CESAREAN SECTION     x 3   CHOLECYSTECTOMY N/A 03/02/2014   Procedure: LAPAROSCOPIC CHOLECYSTECTOMY;  Surgeon: Brittney Faster. Cornett, MD;  Location: [place];  Service: General;  Laterality: N/A;   ESOPHAGOGASTRODUODENOSCOPY (EGD) WITH PROPOFOL N/A 10/15/2021   Procedure: ESOPHAGOGASTRODUODENOSCOPY (EGD) WITH PROPOFOL;  Surgeon: Brittney Park, MD;  Location: [place];  Service: Gastroenterology;  Laterality: N/A;   HERNIA REPAIR  PANENDOSCOPY N/A 03/04/2017   Procedure: PANENDOSCOPY WITH POSSIBLE FOREIGN BODY REMOVAL;  Surgeon: Brittney Marble, MD;  Location: [place];  Service: ENT;  Laterality: N/A;   ROTATOR CUFF REPAIR Left    ROTATOR CUFF REPAIR Right 07/27/2019   SHOULDER ARTHROSCOPY WITH SUBACROMIAL DECOMPRESSION, ROTATOR CUFF REPAIR AND BICEP TENDON REPAIR Right 07/28/2019   Procedure: RIGHT SHOULDER ARTHROSCOP, MINI OPEN ROTATOR CUFF TEAR REPAIR,  BICEPS TENODESIS, DISTAL CLAVICLE EXCISION;   Surgeon: Meredith Pel, MD;  Location: [place];  Service: Orthopedics;  Laterality: Right;   TOOTH EXTRACTION N/A 02/22/2021   Procedure: DENTAL RESTORATION/EXTRACTIONS;  Surgeon: [name], DMD;  Location: [facility];  Service: Oral Surgery;  Laterality: N/A;   TRACHEOSTOMY  02/2017   TUBAL LIGATION     VESICOVAGINAL FISTULA CLOSURE W/ TAH  2009    Family History  Problem Relation Age of Onset   Heart attack Mother    Stroke Mother    Diabetes Mother    Hypertension Mother    Arthritis Mother    Stroke Father    Asthma Sister    Hypertension Sister    Asthma Sister    Diabetes Sister    Asthma Brother    Seizures Brother    Asthma Brother    Diabetes Brother    Asthma Daughter    Asthma Son    Asthma Grandson    Colon cancer Neg Hx    Rectal cancer Neg Hx    Stomach cancer Neg Hx    Esophageal cancer Neg Hx    Ovarian cancer Neg Hx    Pancreatic cancer Neg Hx     Social History   Socioeconomic History   Marital status: Married    Spouse name: Journalist, newspaper   Number of children: Not on file   Years of education: Not on file   Highest education level: Not on file  Occupational History   Not on file  Tobacco Use   Smoking status: Former    Packs/day: 0.50    Years: 25.00    Total pack years: 12.50    Types: Cigarettes    Quit date: 01/21/2016    Years since quitting: 6.2   Smokeless tobacco: Never  Vaping Use   Vaping Use: Never used  Substance and Sexual Activity   Alcohol use: Yes    Comment: social wine   Drug use: Not Currently    Types: Marijuana    Comment: couple times a month   Sexual activity: Yes    Partners: Male    Birth control/protection: Surgical    Comment: Hysterectomy  Other Topics Concern   Not on file  Social History Narrative   Lives with husband   Social Determinants of Health   Financial Resource Strain: Medium Risk (06/06/2021)   Overall Financial Resource Strain (CARDIA)    Difficulty of Paying Living Expenses: Somewhat hard   Food Insecurity: Food Insecurity Present (06/06/2021)   Hunger Vital Sign    Worried About Running Out of Food in the Last Year: Sometimes true    Ran Out of Food in the Last Year: Sometimes true  Transportation Needs: No Transportation Needs (06/06/2021)   PRAPARE - Hydrologist (Medical): No    Lack of Transportation (Non-Medical): No  Physical Activity: Inactive (06/06/2021)   Exercise Vital Sign    Days of Exercise per Week: 0 days    Minutes of Exercise per Session: 0 min  Stress: Stress Concern Present (06/06/2021)   [place]  [employer] - Occupational Stress Questionnaire    Feeling of Stress : Very much  Social Connections: Moderately Isolated (06/06/2021)   Social Connection and Isolation Panel [NHANES]    Frequency of Communication with Friends and Family: More than three times a week    Frequency of Social Gatherings with Friends and Family: More than three times a week    Attends Religious Services: Never    Marine scientist or Organizations: No    Attends Archivist Meetings: Never    Marital Status: Married    Allergies  Allergen Reactions   Reglan [Metoclopramide] Other (See Comments)    Panic attack    Outpatient Medications Prior to Visit  Medication Sig Dispense Refill   albuterol (PROAIR HFA) 108 (90 Base) MCG/ACT inhaler Inhale 2 puffs into the lungs every 6 (six) hours as needed for wheezing or shortness of breath. 18 g 6   albuterol (PROVENTIL) (2.5 MG/3ML) 0.083% nebulizer solution Take 3 mLs (2.5 mg total) by nebulization every 6 (six) hours as needed for wheezing or shortness of breath. 75 mL 3   aspirin EC 81 MG EC tablet Take 1 tablet (81 mg total) by mouth daily. Swallow whole. 30 tablet 11   atorvastatin (LIPITOR) 40 MG tablet Take 1 tablet (40 mg total) by mouth daily. 90 tablet 1   Blood Glucose Monitoring Suppl (ONETOUCH VERIO) w/Device KIT 1 each by Does not apply route daily. Use to  monitor glucose levels daily; E11.9 1 kit 0   cetirizine (ZYRTEC) 10 MG tablet TAKE 1 TABLET BY MOUTH AT BEDTIME 30 tablet 0   diclofenac Sodium (VOLTAREN) 1 % GEL Apply 4 g topically 4 (four) times daily. 100 g 1   docusate sodium (COLACE) 100 MG capsule Take 1 capsule (100 mg total) by mouth 2 (two) times daily. 40 capsule 0   glucose blood (ONETOUCH VERIO) test strip Use to monitor glucose levels daily; E11.9 100 each 2   insulin degludec (TRESIBA FLEXTOUCH) 200 UNIT/ML FlexTouch Pen Inject 150 Units into the skin daily. 54 mL 3   Insulin Pen Needle 31G X 8 MM MISC Inject 1 Units as directed daily.     Melatonin 3 MG TABS Take 3 mg by mouth at bedtime.     Misc. Devices MISC Nebulizer device. Diagnosis - Asthma 1 each 0   Misc. Devices Somers Shower chair 1 each 0   Misc. Devices MISC Bedside commode. Diagnosis- low back pain 1 each 0   Misc. Devices [employer].  Diagnosis-low back pain 1 each 0   mometasone-formoterol (DULERA) 200-5 MCG/ACT AERO Inhale 2 puffs into the lungs in the morning and at bedtime. 1 each 6   ondansetron (ZOFRAN) 4 MG tablet Take 1 tablet (4 mg total) by mouth every 8 (eight) hours as needed for nausea or vomiting. 40 tablet 2   OneTouch Delica Lancets 50I MISC 1 each by Does not apply route daily. Use to monitor glucose levels daily; E11.9 100 each 2   oxyCODONE-acetaminophen (PERCOCET) 10-325 MG tablet Take 1 tablet by mouth 5 (five) times daily as needed.     potassium chloride SA (KLOR-CON M) 20 MEQ tablet Take 1 tablet (20 mEq total) by mouth daily. 7 tablet 0   tiZANidine (ZANAFLEX) 4 MG tablet Take 1 tablet (4 mg total) by mouth every 8 (eight) hours as needed for muscle spasms. 90 tablet 3   amitriptyline (ELAVIL) 10 MG tablet Take 1 tablet (10 mg total) by mouth  at bedtime. 30 tablet 3   buPROPion (WELLBUTRIN SR) 150 MG 12 hr tablet Take 1 tablet (150 mg total) by mouth 2 (two) times daily. 180 tablet 1   dapagliflozin propanediol (FARXIGA) 5 MG TABS tablet Take  1 tablet (5 mg total) by mouth daily before breakfast. 30 tablet 3   furosemide (LASIX) 20 MG tablet Take 1 tablet (20 mg total) by mouth daily. 30 tablet 2   gabapentin (NEURONTIN) 300 MG capsule Take 1 capsule (300 mg total) by mouth 2 (two) times daily.     hydrOXYzine (ATARAX) 25 MG tablet TAKE 1 TABLET BY MOUTH THREE TIMES DAILY AS NEEDED FOR ANXIETY 180 tablet 0   lisinopril (ZESTRIL) 20 MG tablet Take 1 tablet by mouth once daily 90 tablet 0   pantoprazole (PROTONIX) 40 MG tablet Take 1 tablet (40 mg total) by mouth 2 (two) times daily. 60 tablet 3   Semaglutide,0.25 or 0.5MG/DOS, (OZEMPIC, 0.25 OR 0.5 MG/DOSE,) 2 MG/1.5ML SOPN Inject 0.5 mg into the skin once a week. 4.5 mL 3   sertraline (ZOLOFT) 100 MG tablet TAKE 1 TABLET BY MOUTH AT BEDTIME 90 tablet 0   dicyclomine (BENTYL) 20 MG tablet Take 1 tablet (20 mg total) by mouth 2 (two) times daily. 20 tablet 0   silver sulfADIAZINE (SILVADENE) 1 % cream Apply 1 application topically daily. 50 g 1   Facility-Administered Medications Prior to Visit  Medication Dose Route Frequency Provider Last Rate Last Admin   methylPREDNISolone acetate (DEPO-MEDROL) injection 80 mg  80 mg Other Once Magnus Sinning, MD         ROS Review of Systems  Constitutional:  Negative for activity change, appetite change and fatigue.  HENT:  Negative for congestion, sinus pressure and sore throat.   Eyes:  Negative for visual disturbance.  Respiratory:  Negative for cough, chest tightness, shortness of breath and wheezing.   Cardiovascular:  Negative for chest pain and palpitations.  Gastrointestinal:  Negative for abdominal distention, abdominal pain and constipation.  Endocrine: Negative for polydipsia.  Genitourinary:  Negative for dysuria and frequency.  Musculoskeletal:  Positive for back pain. Negative for arthralgias.  Skin:  Negative for rash.  Neurological:  Negative for tremors, light-headedness and numbness.  Hematological:  Does not  bruise/bleed easily.  Psychiatric/Behavioral:  Negative for agitation and behavioral problems.     Objective:  BP (!) 162/109   Pulse 97   Temp 98.7 F (37.1 C) (Oral)   Ht 5' 1" (1.549 m)   Wt 247 lb 6.4 oz (112.2 kg)   LMP  (LMP Unknown)   SpO2 99%   BMI 46.75 kg/m      04/08/2022    9:55 AM 04/02/2022    1:25 PM 02/27/2022    3:32 PM  BP/Weight  Systolic BP 793 903 009  Diastolic BP 233 007 622  Wt. (Lbs) 247.4  243  BMI 46.75 kg/m2  45.91 kg/m2      Physical Exam Constitutional:      Appearance: She is well-developed. She is obese.  Neck:     Comments: Tracheostomy tube in place Cardiovascular:     Rate and Rhythm: Normal rate.     Heart sounds: Normal heart sounds. No murmur heard. Pulmonary:     Effort: Pulmonary effort is normal.     Breath sounds: Normal breath sounds. No wheezing or rales.  Chest:     Chest wall: No tenderness.  Abdominal:     General: Bowel sounds are normal. There is  no distension.     Palpations: Abdomen is soft. There is no mass.     Tenderness: There is no abdominal tenderness.  Musculoskeletal:        General: Tenderness (lumbar spine) present.     Right lower leg: No edema.     Left lower leg: No edema.  Neurological:     Mental Status: She is alert and oriented to person, place, and time.  Psychiatric:        Mood and Affect: Mood normal.        Latest Ref Rng & Units 11/14/2021   10:59 AM 11/01/2021    4:47 PM 10/31/2021    2:27 PM  CMP  Glucose 70 - 99 mg/dL 187  153  141   BUN 6 - 24 mg/dL _0 Creatinine 0.57 - 1.00 mg/dL 0.88  1.00  1.18   Sodium 134 - 144 mmol/L 141  139  140   Potassium 3.5 - 5.2 mmol/L 3.6  3.3  3.3   Chloride 96 - 106 mmol/L 104  106  107   CO2 20 - 29 mmol/L _1 Calcium 8.7 - 10.2 mg/dL 9.3  8.9  9.1   Total Protein 6.5 - 8.1 g/dL  8.1  8.0   Total Bilirubin 0.3 - 1.2 mg/dL  1.1  0.5   Alkaline Phos 38 - 126 U/L  94  90   AST 15 - 41 U/L  24  18   ALT 0 - 44 U/L  18  19      Lipid Panel     Component Value Date/Time   CHOL 188 11/14/2021 1059   TRIG 213 (H) 11/14/2021 1059   HDL 48 11/14/2021 1059   CHOLHDL 3.2 06/15/2018 1031   CHOLHDL 3.9 02/22/2016 0954   VLDL 26 02/22/2016 0954   LDLCALC 103 (H) 11/14/2021 1059    CBC    Component Value Date/Time   WBC 8.2 11/01/2021 1647   RBC 4.22 11/01/2021 1647   HGB 11.5 (L) 11/01/2021 1647   HGB 11.9 04/12/2018 1035   HCT 35.5 (L) 11/01/2021 1647   HCT 34.8 04/12/2018 1035   PLT 451 (H) 11/01/2021 1647   PLT 373 04/12/2018 1035   MCV 84.1 11/01/2021 1647   MCV 84 04/12/2018 1035   MCH 27.3 11/01/2021 1647   MCHC 32.4 11/01/2021 1647   RDW 15.7 (H) 11/01/2021 1647   RDW 14.6 04/12/2018 1035   LYMPHSABS 2.0 11/01/2021 1647   LYMPHSABS 4.0 (H) 04/12/2018 1035   MONOABS 0.4 11/01/2021 1647   EOSABS 0.0 11/01/2021 1647   EOSABS 0.0 04/12/2018 1035   BASOSABS 0.0 11/01/2021 1647   BASOSABS 0.0 04/12/2018 1035    Lab Results  Component Value Date   HGBA1C 10.0 (A) 04/08/2022    Assessment & Plan:  1. Type 1 diabetes mellitus with other specified complication (HCC) Uncontrolled with A1c of 10.0; goal is less than 7.0 Ozempic dose increased Counseled on Diabetic diet, my plate method, 030 minutes of moderate intensity exercise/week Blood sugar logs with fasting goals of 80-120 mg/dl, random of less than 180 and in the event of sugars less than 60 mg/dl or greater than 400 mg/dl encouraged to notify the clinic. Advised on the need for annual eye exams, annual foot exams, Pneumonia vaccine. - POCT glucose (manual entry) - POCT glycosylated hemoglobin (Hb A1C) - LP+Non-HDL Cholesterol - CMP14+EGFR - Semaglutide, 1 MG/DOSE, 4 MG/3ML SOPN; Inject  1 mg as directed once a week. 4 weeks then increase to 2 mg once a week  Dispense: 3 mL; Refill: 0 - Semaglutide, 2 MG/DOSE, 8 MG/3ML SOPN; Inject 2 mg as directed once a week. After completion of 4-week course of 1 mg dose  Dispense: 3 mL; Refill: 6 -  dapagliflozin propanediol (FARXIGA) 5 MG TABS tablet; Take 1 tablet (5 mg total) by mouth daily before breakfast.  Dispense: 90 tablet; Refill: 1  2. Vasomotor symptoms due to menopause Discussed nonpharmacological and pharmacological management She is currently on an SSRI in addition to her other antidepressant I would not want to change her antidepressive regimen She might benefit from hormonal therapy and we have discussed the pros and cons but I will refer her to GYN - Ambulatory referral to Gynecology  3. Intractable migraine with aura without status migrainosus Worsened ever since she ran out of her narcotic analgesic - amitriptyline (ELAVIL) 10 MG tablet; Take 1 tablet (10 mg total) by mouth at bedtime.  Dispense: 30 tablet; Refill: 3 - ketorolac (TORADOL) injection 60 mg  4. Hypertension associated with diabetes ([place]) Uncontrolled Currently in pain hence I will hold off on adjusting her regimen Advised to keep blood pressure log at home once she has obtained her pain medications and if blood pressure remains above goal consider adding chlorthalidone Counseled on blood pressure goal of less than 130/80, low-sodium, DASH diet, medication compliance, 150 minutes of moderate intensity exercise per week. Discussed medication compliance, adverse effects. - lisinopril (ZESTRIL) 20 MG tablet; Take 1 tablet (20 mg total) by mouth daily.  Dispense: 90 tablet; Refill: 1  5. Mild episode of recurrent major depressive disorder (HCC) Controlled - buPROPion (WELLBUTRIN SR) 150 MG 12 hr tablet; Take 1 tablet (150 mg total) by mouth 2 (two) times daily.  Dispense: 180 tablet; Refill: 1 - sertraline (ZOLOFT) 100 MG tablet; Take 1 tablet (100 mg total) by mouth at bedtime.  Dispense: 90 tablet; Refill: 1 - hydrOXYzine (ATARAX) 25 MG tablet; Take 1 tablet (25 mg total) by mouth 3 (three) times daily as needed. for anxiety  Dispense: 180 tablet; Refill: 1  6. Gastroesophageal reflux disease without  esophagitis Controlled - pantoprazole (PROTONIX) 40 MG tablet; Take 1 tablet (40 mg total) by mouth 2 (two) times daily.  Dispense: 60 tablet; Refill: 3  7. Hyperlipidemia due to type 1 diabetes mellitus ([place]) We will send of lipid panel today and adjust regimen accordingly  8. Bulging lumbar disc Uncontrolled due to running out of narcotic analgesic She has an upcoming appointment with the pain clinic in 2 days - gabapentin (NEURONTIN) 300 MG capsule; Take 1 capsule (300 mg total) by mouth 2 (two) times daily.  Dispense: 180 capsule; Refill: 1   Meds ordered this encounter  Medications   Semaglutide, 1 MG/DOSE, 4 MG/3ML SOPN    Sig: Inject 1 mg as directed once a week. 4 weeks then increase to 2 mg once a week    Dispense:  3 mL    Refill:  0   Semaglutide, 2 MG/DOSE, 8 MG/3ML SOPN    Sig: Inject 2 mg as directed once a week. After completion of 4-week course of 1 mg dose    Dispense:  3 mL    Refill:  6   amitriptyline (ELAVIL) 10 MG tablet    Sig: Take 1 tablet (10 mg total) by mouth at bedtime.    Dispense:  30 tablet    Refill:  3    Discontinue  Topamax   furosemide (LASIX) 20 MG tablet    Sig: Take 1 tablet (20 mg total) by mouth daily.    Dispense:  30 tablet    Refill:  2   gabapentin (NEURONTIN) 300 MG capsule    Sig: Take 1 capsule (300 mg total) by mouth 2 (two) times daily.    Dispense:  180 capsule    Refill:  1   lisinopril (ZESTRIL) 20 MG tablet    Sig: Take 1 tablet (20 mg total) by mouth daily.    Dispense:  90 tablet    Refill:  1   buPROPion (WELLBUTRIN SR) 150 MG 12 hr tablet    Sig: Take 1 tablet (150 mg total) by mouth 2 (two) times daily.    Dispense:  180 tablet    Refill:  1   pantoprazole (PROTONIX) 40 MG tablet    Sig: Take 1 tablet (40 mg total) by mouth 2 (two) times daily.    Dispense:  60 tablet    Refill:  3   sertraline (ZOLOFT) 100 MG tablet    Sig: Take 1 tablet (100 mg total) by mouth at bedtime.    Dispense:  90 tablet    Refill:   1   hydrOXYzine (ATARAX) 25 MG tablet    Sig: Take 1 tablet (25 mg total) by mouth 3 (three) times daily as needed. for anxiety    Dispense:  180 tablet    Refill:  1   dapagliflozin propanediol (FARXIGA) 5 MG TABS tablet    Sig: Take 1 tablet (5 mg total) by mouth daily before breakfast.    Dispense:  90 tablet    Refill:  1   ketorolac (TORADOL) injection 60 mg    Follow-up: Return in about 3 months (around 07/09/2022) for Chronic medical conditions.    Visit required 49 minutes of patient care including median intraservice time reviewing previous notes and test results, counseling patient on diagnosis and work up of vasomotor menopausal symptoms in addition to management of chronic medical conditions.Time also spent ordering medications, investigations and documenting in the chart.  All questions were answered to the patient's satisfaction    [name], MD, FAAFP. [facility] [place], [place]   04/08/2022, 1:32 PM

## 2022-04-08 NOTE — Patient Instructions (Signed)

## 2022-04-09 ENCOUNTER — Other Ambulatory Visit: Payer: Self-pay | Admitting: Family Medicine

## 2022-04-09 ENCOUNTER — Ambulatory Visit
Admission: RE | Admit: 2022-04-09 | Discharge: 2022-04-09 | Disposition: A | Discharge status: Home / Self Care | Payer: 59 | Source: Ambulatory Visit | Attending: Specialist | Admitting: Specialist

## 2022-04-09 DIAGNOSIS — G8929 Other chronic pain: Secondary | ICD-10-CM

## 2022-04-09 DIAGNOSIS — E1069 Type 1 diabetes mellitus with other specified complication: Secondary | ICD-10-CM

## 2022-04-09 LAB — CMP14+EGFR
ALT: 23 IU/L (ref 0–32)
AST: 16 IU/L (ref 0–40)
Albumin/Globulin Ratio: 1.4 (ref 1.2–2.2)
Albumin: 4.4 g/dL (ref 3.8–4.9)
Alkaline Phosphatase: 143 IU/L — ABNORMAL HIGH (ref 44–121)
BUN/Creatinine Ratio: 16 (ref 9–23)
BUN: 17 mg/dL (ref 6–24)
Bilirubin Total: 0.2 mg/dL (ref 0.0–1.2)
CO2: 23 mmol/L (ref 20–29)
Calcium: 9.4 mg/dL (ref 8.7–10.2)
Chloride: 106 mmol/L (ref 96–106)
Creatinine, Ser: 1.08 mg/dL — ABNORMAL HIGH (ref 0.57–1.00)
Globulin, Total: 3.1 g/dL (ref 1.5–4.5)
Glucose: 126 mg/dL — ABNORMAL HIGH (ref 70–99)
Potassium: 4.2 mmol/L (ref 3.5–5.2)
Sodium: 142 mmol/L (ref 134–144)
Total Protein: 7.5 g/dL (ref 6.0–8.5)
eGFR: 62 mL/min/{1.73_m2} (ref >59)

## 2022-04-09 LAB — LP+NON-HDL CHOLESTEROL
Cholesterol, Total: 183 mg/dL (ref 100–199)
HDL: 49 mg/dL (ref >39)
LDL Chol Calc (NIH): 108 mg/dL — ABNORMAL HIGH (ref 0–99)
Total Non-HDL-Chol (LDL+VLDL): 134 mg/dL — ABNORMAL HIGH (ref 0–129)
Triglycerides: 148 mg/dL (ref 0–149)
VLDL Cholesterol Cal: 26 mg/dL (ref 5–40)

## 2022-04-09 MED ORDER — ATORVASTATIN CALCIUM 80 MG PO TABS: 80.0000 mg | ORAL_TABLET | Freq: Every day | ORAL | 1 refills | Status: DC

## 2022-04-17 NOTE — Procedures (Signed)
Lumbosacral Transforaminal Epidural Steroid Injection - Sub-Pedicular Approach with Fluoroscopic Guidance  Patient: [name]      Date of Birth: 03-29-1971 MRN: [id number] PCP: [name], MD      Visit Date: 04/02/2022   Universal Protocol:    Date/Time: 04/02/2022  Consent Given By: the patient  Position: PRONE  Additional Comments: Vital signs were monitored before and after the procedure. Patient was prepped and draped in the usual sterile fashion. The correct patient, procedure, and site was verified.   Injection Procedure Details:   Procedure diagnoses: Lumbar radiculopathy [M54.16]    Meds Administered:  Meds ordered this encounter  Medications   methylPREDNISolone acetate (DEPO-MEDROL) injection 80 mg    Laterality: Right  Location/Site: L4  Needle:5.0 in., 22 ga.  Short bevel or Quincke spinal needle  Needle Placement: Transforaminal  Findings:    -Comments: Excellent flow of contrast along the nerve, nerve root and into the epidural space.  Procedure Details: After squaring off the end-plates to get a true AP view, the C-arm was positioned so that an oblique view of the foramen as noted above was visualized. The target area is just inferior to the "nose of the scotty dog" or sub pedicular. The soft tissues overlying this structure were infiltrated with 2-3 ml. of 1% Lidocaine without Epinephrine.  The spinal needle was inserted toward the target using a "trajectory" view along the fluoroscope beam.  Under AP and lateral visualization, the needle was advanced so it did not puncture dura and was located close the 6 O'Clock position of the pedical in AP tracterory. Biplanar projections were used to confirm position. Aspiration was confirmed to be negative for CSF and/or blood. A 1-2 ml. volume of Isovue-250 was injected and flow of contrast was noted at each level. Radiographs were obtained for documentation purposes.   After attaining  the desired flow of contrast documented above, a 0.5 to 1.0 ml test dose of 0.25% Marcaine was injected into each respective transforaminal space.  The patient was observed for 90 seconds post injection.  After no sensory deficits were reported, and normal lower extremity motor function was noted,   the above injectate was administered so that equal amounts of the injectate were placed at each foramen (level) into the transforaminal epidural space.   Additional Comments:  The patient tolerated the procedure well Dressing: 2 x 2 sterile gauze and Band-Aid    Post-procedure details: Patient was observed during the procedure. Post-procedure instructions were reviewed.  Patient left the clinic in stable condition.

## 2022-04-17 NOTE — Progress Notes (Signed)
[name] - 51 y.o. female MRN [id number]  Date of birth: 1971/07/24  Office Visit Note: Visit Date: 04/02/2022 PCP: [name], MD Referred by: [name], MD  Subjective: Chief Complaint  Patient presents with   Lower Back - Pain   Right Leg - Pain   HPI:  [name] is a 51 y.o. female who comes in today at the request of Dr. [name] for planned Right L4-5 Lumbar Transforaminal epidural steroid injection with fluoroscopic guidance.  The patient has failed conservative care including home exercise, medications, time and activity modification.  This injection will be diagnostic and hopefully therapeutic.  Please see requesting physician notes for further details and justification. MRI reviewed with images and spine model.  MRI reviewed in the note below.   ROS Otherwise per HPI.  Assessment & Plan: Visit Diagnoses:    ICD-10-CM   1. Lumbar radiculopathy  M54.16 XR C-ARM NO REPORT    Epidural Steroid injection    methylPREDNISolone acetate (DEPO-MEDROL) injection 80 mg    2. Post laminectomy syndrome  M96.1     3. S/P lumbar fusion  Z98.1       Plan: No additional findings.   Meds & Orders:  Meds ordered this encounter  Medications   methylPREDNISolone acetate (DEPO-MEDROL) injection 80 mg    Orders Placed This Encounter  Procedures   XR C-ARM NO REPORT   Epidural Steroid injection    Follow-up: Return for visit to requesting provider as needed.   Procedures: No procedures performed  Lumbosacral Transforaminal Epidural Steroid Injection - Sub-Pedicular Approach with Fluoroscopic Guidance  Patient: [name]      Date of Birth: 02/27/1971 MRN: [id number] PCP: [name], MD      Visit Date: 04/02/2022   Universal Protocol:    Date/Time: 04/02/2022  Consent Given By: the patient  Position: PRONE  Additional Comments: Vital signs were monitored before and after the  procedure. Patient was prepped and draped in the usual sterile fashion. The correct patient, procedure, and site was verified.   Injection Procedure Details:   Procedure diagnoses: Lumbar radiculopathy [M54.16]    Meds Administered:  Meds ordered this encounter  Medications   methylPREDNISolone acetate (DEPO-MEDROL) injection 80 mg    Laterality: Right  Location/Site: L4  Needle:5.0 in., 22 ga.  Short bevel or Quincke spinal needle  Needle Placement: Transforaminal  Findings:    -Comments: Excellent flow of contrast along the nerve, nerve root and into the epidural space.  Procedure Details: After squaring off the end-plates to get a true AP view, the C-arm was positioned so that an oblique view of the foramen as noted above was visualized. The target area is just inferior to the "nose of the scotty dog" or sub pedicular. The soft tissues overlying this structure were infiltrated with 2-3 ml. of 1% Lidocaine without Epinephrine.  The spinal needle was inserted toward the target using a "trajectory" view along the fluoroscope beam.  Under AP and lateral visualization, the needle was advanced so it did not puncture dura and was located close the 6 O'Clock position of the pedical in AP tracterory. Biplanar projections were used to confirm position. Aspiration was confirmed to be negative for CSF and/or blood. A 1-2 ml. volume of Isovue-250 was injected and flow of contrast was noted at each level. Radiographs were obtained for documentation purposes.   After attaining the desired flow of contrast documented above, a 0.5 to 1.0 ml test dose of 0.25% Marcaine  was injected into each respective transforaminal space.  The patient was observed for 90 seconds post injection.  After no sensory deficits were reported, and normal lower extremity motor function was noted,   the above injectate was administered so that equal amounts of the injectate were placed at each foramen (level) into the  transforaminal epidural space.   Additional Comments:  The patient tolerated the procedure well Dressing: 2 x 2 sterile gauze and Band-Aid    Post-procedure details: Patient was observed during the procedure. Post-procedure instructions were reviewed.  Patient left the clinic in stable condition.    Clinical History: MRI LUMBAR SPINE WITHOUT AND WITH CONTRAST   TECHNIQUE: Multiplanar and multiecho pulse sequences of the lumbar spine were obtained without and with intravenous contrast.   CONTRAST:  40m MULTIHANCE GADOBENATE DIMEGLUMINE 529 MG/ML IV SOLN   COMPARISON:  Lumbar radiographs 01/02/2022, lumbar spine MRI 12/09/2020   FINDINGS: Segmentation: Standard; the lowest formed disc space is designated L5-S1.   Alignment: There is straightening of the normal lumbar lordosis. There is no antero or retrolisthesis.   Vertebrae: There has been interval posterior instrumented fusion at L4-L5 with suspected right laminectomy. There is no evidence of complication. Marrow signal is otherwise normal. There is no suspicious marrow signal abnormality or marrow edema. There is no abnormal marrow enhancement.   Conus medullaris and cauda equina: Conus extends to the L1 level. Conus and cauda equina appear normal. There is no abnormal enhancement of the cauda equina nerve roots.   Paraspinal and other soft tissues: Postsurgical changes are noted in the soft tissues posterior to the surgical levels. The paraspinal soft tissues are otherwise unremarkable.   Disc levels:   The disc heights at the nonsurgical levels are preserved.   T12-L1: No significant spinal canal or neural foraminal stenosis   L1-L2: No significant spinal canal or neural foraminal stenosis   L2-L3: No significant spinal canal or neural foraminal stenosis   L3-L4: There is a minimal disc bulge without significant spinal canal or neural foraminal stenosis   L4-L5: Status post posterior instrumented  fusion and suspected right laminectomy. The previously seen disc protrusion has resolved following surgery. There is residual mild narrowing of the left subarticular zone without evidence of frank nerve root impingement. There is no residual spinal canal or neural foraminal stenosis. There is no convincing evidence of nerve root impingement or significant neural foraminal stenosis.   L5-S1: No significant spinal canal or neural foraminal stenosis.   IMPRESSION: 1. Status post posterior instrumented fusion and suspected right laminectomy at L4-L5. The previously seen disc protrusion on the study from 12/19/2020 is no longer present, and previously seen spinal canal stenosis has significantly improved. There is residual mild narrowing of the left subarticular zone without evidence of frank nerve root impingement. No other convincing evidence of nerve root impingement to explain the patient's right-sided symptoms. 2. No significant spinal canal or neural foraminal stenosis at the other levels.     Electronically Signed   By: PValetta MoleM.D.   On: 01/16/2022 13:29     Objective:  VS:  HT:    WT:   BMI:     BP:(!) 164/112  HR:(!) 108bpm  TEMP: ( )  RESP:  Physical Exam Vitals and nursing note reviewed.  Constitutional:      General: She is not in acute distress.    Appearance: Normal appearance. She is obese. She is not ill-appearing.  HENT:     Head: Normocephalic and atraumatic.  Right Ear: External ear normal.     Left Ear: External ear normal.  Eyes:     Extraocular Movements: Extraocular movements intact.  Cardiovascular:     Rate and Rhythm: Normal rate.     Pulses: Normal pulses.  Pulmonary:     Effort: Pulmonary effort is normal. No respiratory distress.  Abdominal:     General: There is no distension.     Palpations: Abdomen is soft.  Musculoskeletal:        General: Tenderness present.     Cervical back: Neck supple.     Right lower leg: No edema.      Left lower leg: No edema.     Comments: Patient has good distal strength with no pain over the greater trochanters.  No clonus or focal weakness.  Skin:    Findings: No erythema, lesion or rash.  Neurological:     General: No focal deficit present.     Mental Status: She is alert and oriented to person, place, and time.     Sensory: No sensory deficit.     Motor: No weakness or abnormal muscle tone.     Coordination: Coordination normal.  Psychiatric:        Mood and Affect: Mood normal.        Behavior: Behavior normal.      Imaging: No results found.

## 2022-04-22 DIAGNOSIS — Z9049 Acquired absence of other specified parts of digestive tract: Secondary | ICD-10-CM | POA: Diagnosis not present

## 2022-04-22 DIAGNOSIS — Z87891 Personal history of nicotine dependence: Secondary | ICD-10-CM | POA: Diagnosis not present

## 2022-04-22 DIAGNOSIS — J384 Edema of larynx: Secondary | ICD-10-CM | POA: Diagnosis not present

## 2022-04-22 DIAGNOSIS — J398 Other specified diseases of upper respiratory tract: Secondary | ICD-10-CM | POA: Diagnosis not present

## 2022-04-22 DIAGNOSIS — M542 Cervicalgia: Secondary | ICD-10-CM | POA: Diagnosis not present

## 2022-04-22 DIAGNOSIS — J386 Stenosis of larynx: Secondary | ICD-10-CM | POA: Diagnosis not present

## 2022-04-30 DIAGNOSIS — J45909 Unspecified asthma, uncomplicated: Secondary | ICD-10-CM | POA: Diagnosis not present

## 2022-04-30 DIAGNOSIS — G4733 Obstructive sleep apnea (adult) (pediatric): Secondary | ICD-10-CM | POA: Diagnosis not present

## 2022-04-30 DIAGNOSIS — J449 Chronic obstructive pulmonary disease, unspecified: Secondary | ICD-10-CM | POA: Diagnosis not present

## 2022-04-30 DIAGNOSIS — J9610 Chronic respiratory failure, unspecified whether with hypoxia or hypercapnia: Secondary | ICD-10-CM | POA: Diagnosis not present

## 2022-04-30 DIAGNOSIS — Z930 Tracheostomy status: Secondary | ICD-10-CM | POA: Diagnosis not present

## 2022-05-05 DIAGNOSIS — R030 Elevated blood-pressure reading, without diagnosis of hypertension: Secondary | ICD-10-CM | POA: Diagnosis not present

## 2022-05-05 DIAGNOSIS — Z79899 Other long term (current) drug therapy: Secondary | ICD-10-CM | POA: Diagnosis not present

## 2022-05-05 DIAGNOSIS — Z930 Tracheostomy status: Secondary | ICD-10-CM | POA: Diagnosis not present

## 2022-05-05 DIAGNOSIS — E119 Type 2 diabetes mellitus without complications: Secondary | ICD-10-CM | POA: Diagnosis not present

## 2022-05-05 DIAGNOSIS — M545 Low back pain, unspecified: Secondary | ICD-10-CM | POA: Diagnosis not present

## 2022-05-05 DIAGNOSIS — G8929 Other chronic pain: Secondary | ICD-10-CM | POA: Diagnosis not present

## 2022-05-05 DIAGNOSIS — M5416 Radiculopathy, lumbar region: Secondary | ICD-10-CM | POA: Diagnosis not present

## 2022-05-06 DIAGNOSIS — G4733 Obstructive sleep apnea (adult) (pediatric): Secondary | ICD-10-CM | POA: Diagnosis not present

## 2022-05-06 DIAGNOSIS — J45909 Unspecified asthma, uncomplicated: Secondary | ICD-10-CM | POA: Diagnosis not present

## 2022-05-06 DIAGNOSIS — J449 Chronic obstructive pulmonary disease, unspecified: Secondary | ICD-10-CM | POA: Diagnosis not present

## 2022-05-06 DIAGNOSIS — J9610 Chronic respiratory failure, unspecified whether with hypoxia or hypercapnia: Secondary | ICD-10-CM | POA: Diagnosis not present

## 2022-05-12 IMAGING — CT CT ANGIO CHEST-ABD-PELV FOR DISSECTION W/ AND WO/W CM
2 of 7 series · 15 of 46 positions shown, 17 images · IV contrast (APPLIED)
Comparison: CT abdomen pelvis dated 10/11/2021

CLINICAL DATA: Chest and back pain.  Concern for aortic syndrome.

EXAM:
CT ANGIOGRAPHY CHEST, ABDOMEN AND PELVIS
TECHNIQUE: Non-contrast CT of the chest was initially obtained.

[Series 5: axial arterial · axial · arterial · 0.80mm/px · z∈[-400,+118]mm · 12 of 289 slices shown, 14 images]
[im 15/289  soft-tissue]
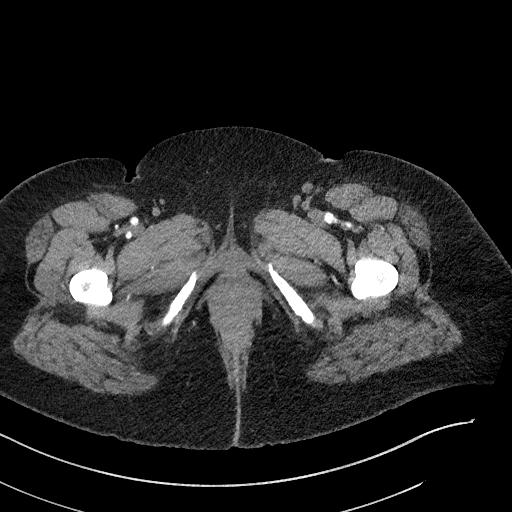
[im 15/289  bone]
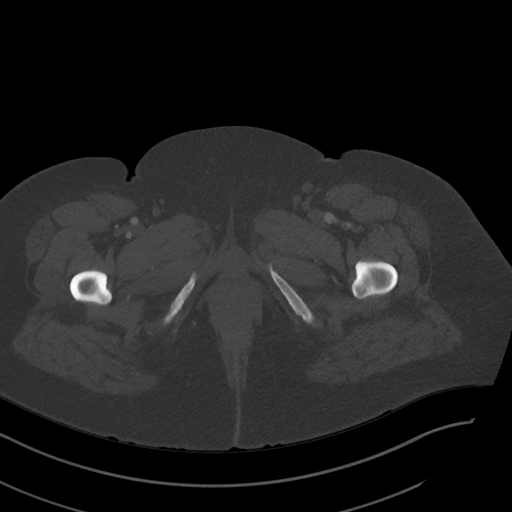
[im 44/289  soft-tissue]
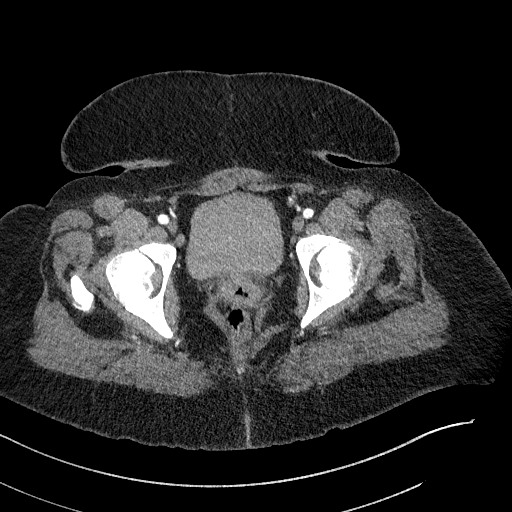
[im 58/289  soft-tissue]
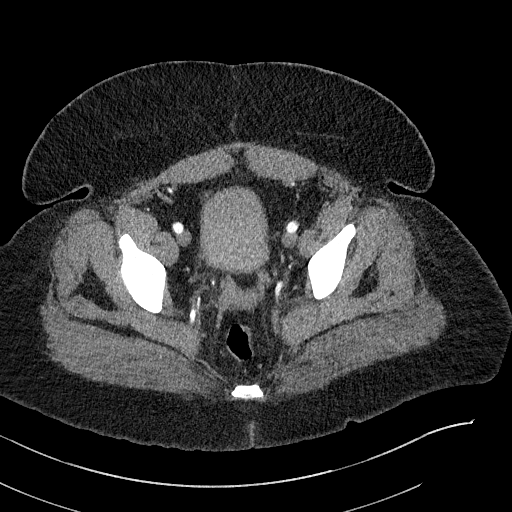
[im 87/289  soft-tissue]
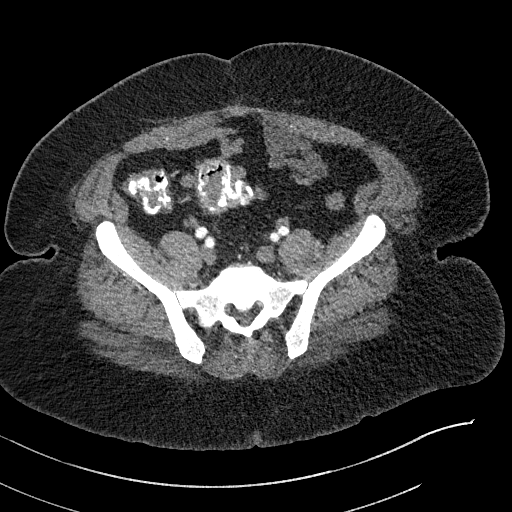
[im 116/289  soft-tissue]
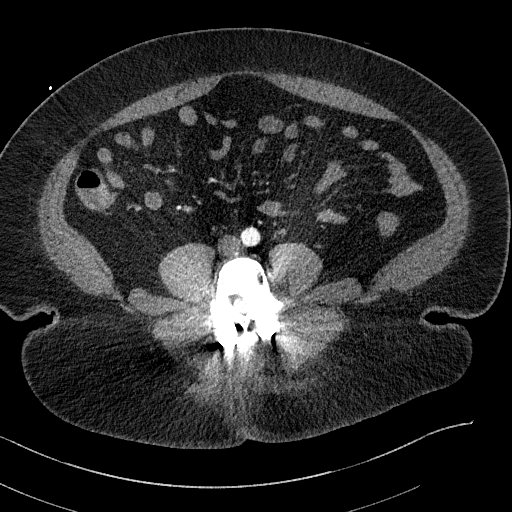
[im 130/289  soft-tissue]
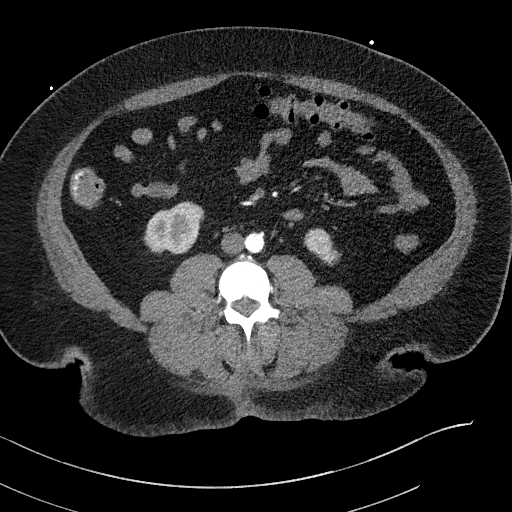
[im 159/289  soft-tissue]
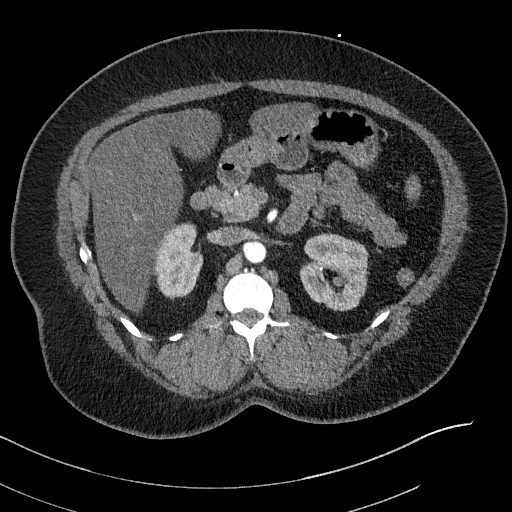
[im 173/289  soft-tissue]
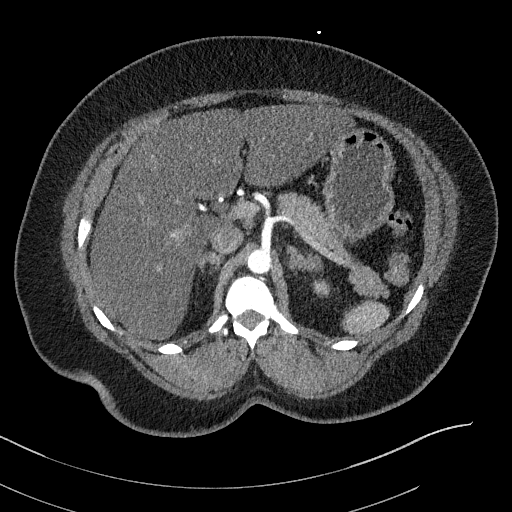
[im 202/289  soft-tissue]
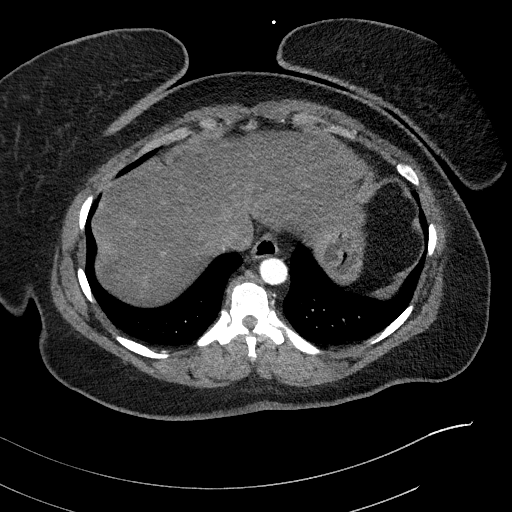
[im 202/289  bone]
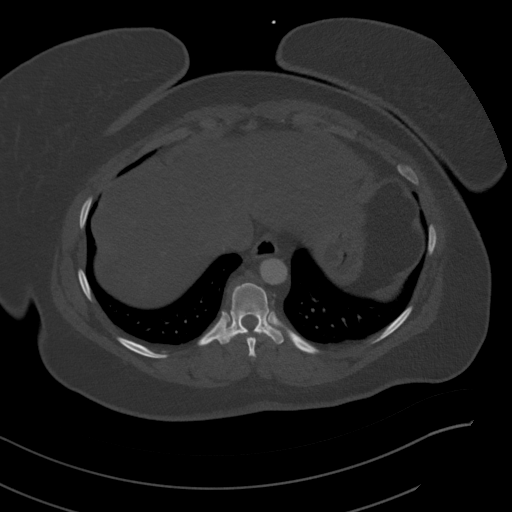
[im 231/289  soft-tissue]
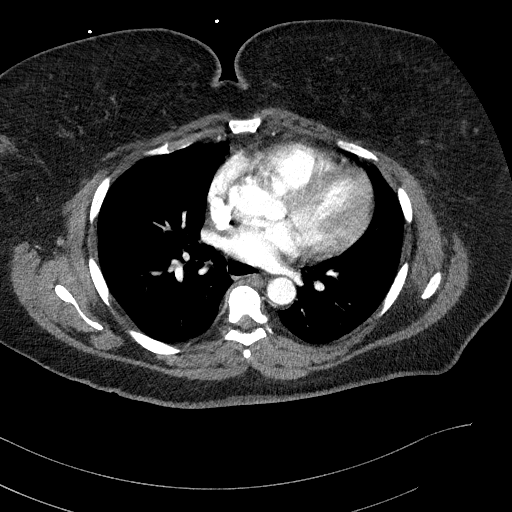
[im 245/289  soft-tissue]
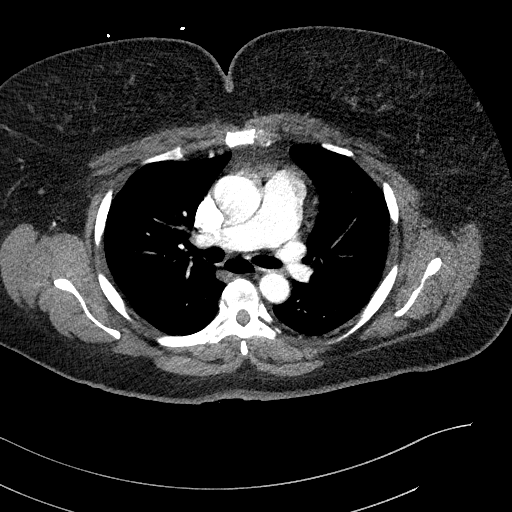
[im 274/289  soft-tissue]
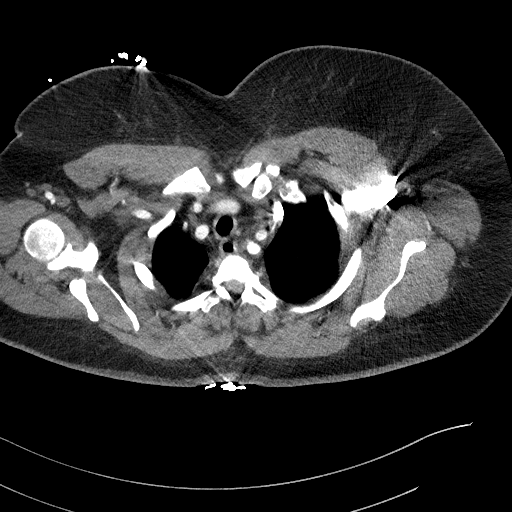

[Series 8: cor soft · coronal · 0.92mm/px · 3 of 175 slices shown]
[im 44/175  soft-tissue]
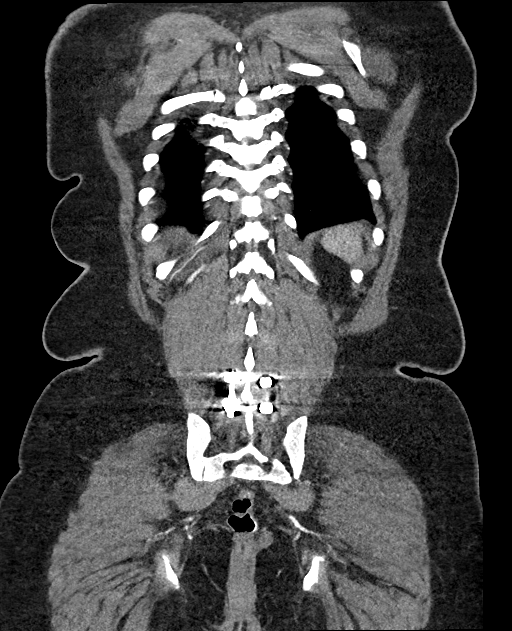
[im 88/175  soft-tissue]
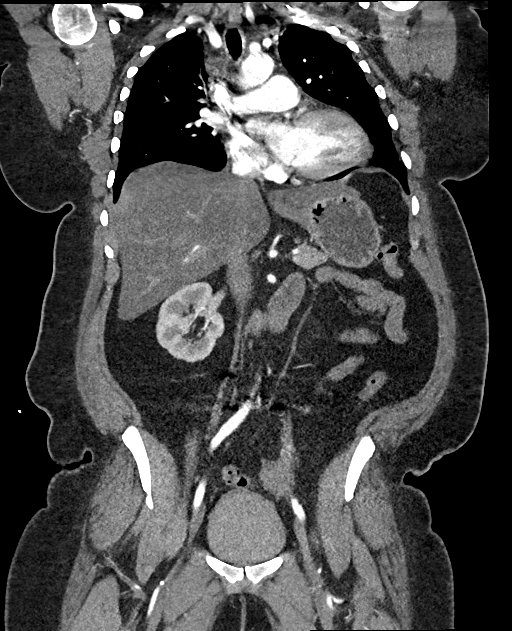
[im 131/175  soft-tissue]
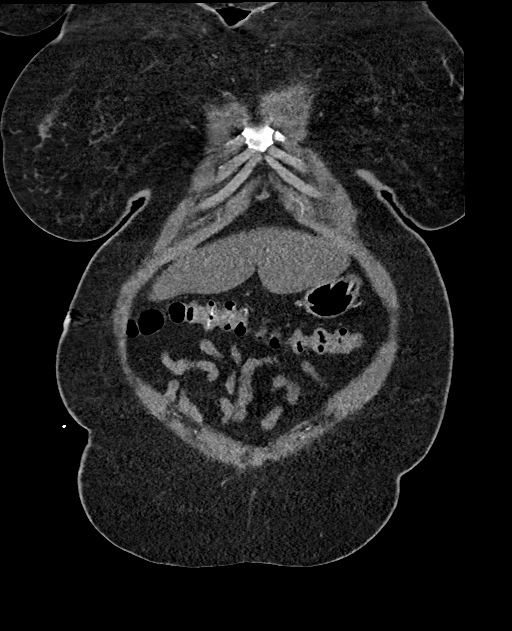

[15 of 46 positions shown; findings below may reference images not displayed]

Multidetector CT imaging through the chest, abdomen and pelvis was
performed using the standard protocol during bolus administration of
intravenous contrast. Multiplanar reconstructed images and MIPs were
obtained and reviewed to evaluate the vascular anatomy.

RADIATION DOSE REDUCTION: This exam was performed according to the
departmental dose-optimization program which includes automated
exposure control, adjustment of the mA and/or kV according to
patient size and/or use of iterative reconstruction technique.

CONTRAST:  100mL OMNIPAQUE IOHEXOL 350 MG/ML SOLN
FINDINGS: CTA CHEST FINDINGS

Cardiovascular: There is no cardiomegaly or pericardial effusion.
The thoracic aorta is unremarkable. The origins of the great vessels
of the aortic arch appear patent. The central pulmonary arteries are
unremarkable.

Mediastinum/Nodes: No hilar or mediastinal adenopathy. The esophagus
is grossly unremarkable. No mediastinal fluid collection.

Lungs/Pleura: The lungs are clear. There is no pleural effusion
pneumothorax. The central airways are patent. Tracheostomy with tip
approximately 5.5 cm above the carina.

Musculoskeletal: No acute osseous pathology.

Review of the MIP images confirms the above findings.

CTA ABDOMEN AND PELVIS FINDINGS

VASCULAR

Aorta: Normal caliber aorta without aneurysm, dissection, vasculitis
or significant stenosis.

Celiac: Patent without evidence of aneurysm, dissection, vasculitis
or significant stenosis.

SMA: Patent without evidence of aneurysm, dissection, vasculitis or
significant stenosis.

Renals: Both renal arteries are patent without evidence of aneurysm,
dissection, vasculitis, fibromuscular dysplasia or significant
stenosis.

IMA: Patent without evidence of aneurysm, dissection, vasculitis or
significant stenosis.

Inflow: Patent without evidence of aneurysm, dissection, vasculitis
or significant stenosis.

Veins: No obvious venous abnormality within the limitations of this
arterial phase study.

Review of the MIP images confirms the above findings.

NON-VASCULAR

No intra-abdominal free air or free fluid.

Hepatobiliary: Fatty liver. No intrahepatic biliary ductal
dilatation. Cholecystectomy.

Pancreas: Unremarkable. No pancreatic ductal dilatation or
surrounding inflammatory changes.

Spleen: Normal in size without focal abnormality.

Adrenals/Urinary Tract: Left adrenal thickening or hyperplasia. The
right adrenal gland is unremarkable. There is no hydronephrosis on
either side. The visualized ureters and urinary bladder appear
unremarkable.

Stomach/Bowel: No bowel obstruction or active inflammation. The
appendix is not visualized with certainty. No inflammatory changes
identified in the right lower quadrant.

Lymphatic: No adenopathy.

Reproductive: Hysterectomy. Asymmetric enlargement of the left
ovary. Similar appearance of a 2 cm fluid density ovoid lesion in
the anterior pelvis to the right of the midline.

Other: Midline vertical anterior abdominal wall incisional scar.

Musculoskeletal: Lower lumbar posterior fusion and disc spacer. No
acute osseous pathology.

Review of the MIP images confirms the above findings.
IMPRESSION: 1. No acute intrathoracic, abdominal, or pelvic pathology. No aortic
dissection or aneurysm.
2. Fatty liver.

## 2022-05-13 IMAGING — DX DG ABDOMEN ACUTE W/ 1V CHEST
4 series · 4 of 4 positions shown · non-contrast
Comparison: CTA chest abdomen pelvis 10/12/2021

CLINICAL DATA: Abdominal pain.

EXAM:
DG ABDOMEN ACUTE WITH 1 VIEW CHEST

[chest pa]
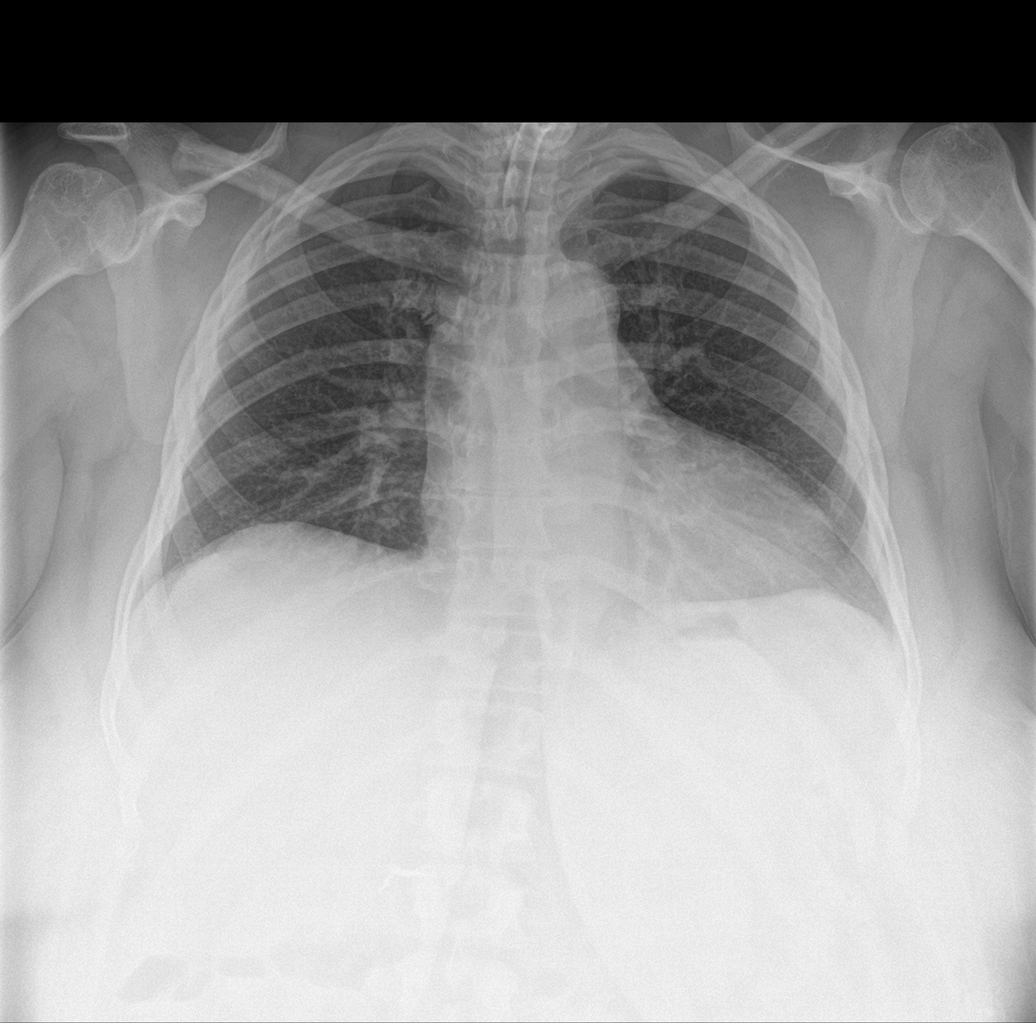

[abdomen erect]
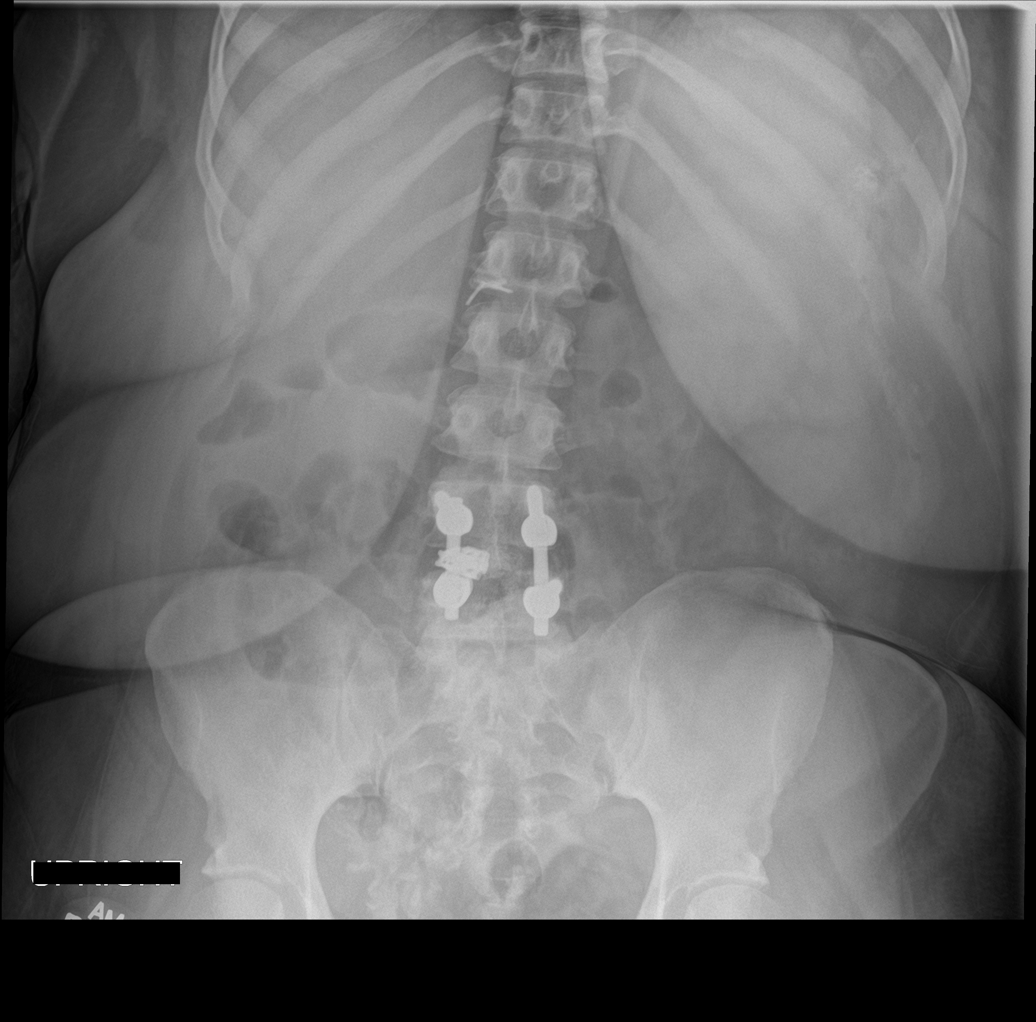

[abdomen supine (1 of 2)]
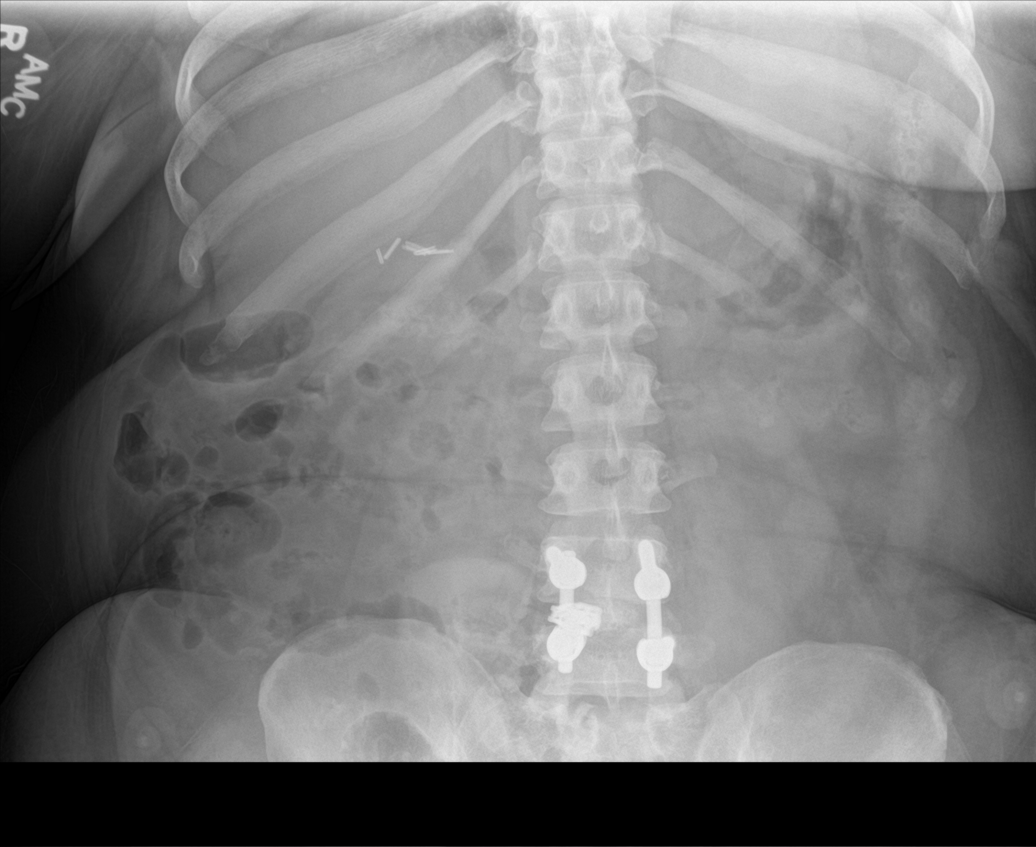

[abdomen supine (2 of 2)]
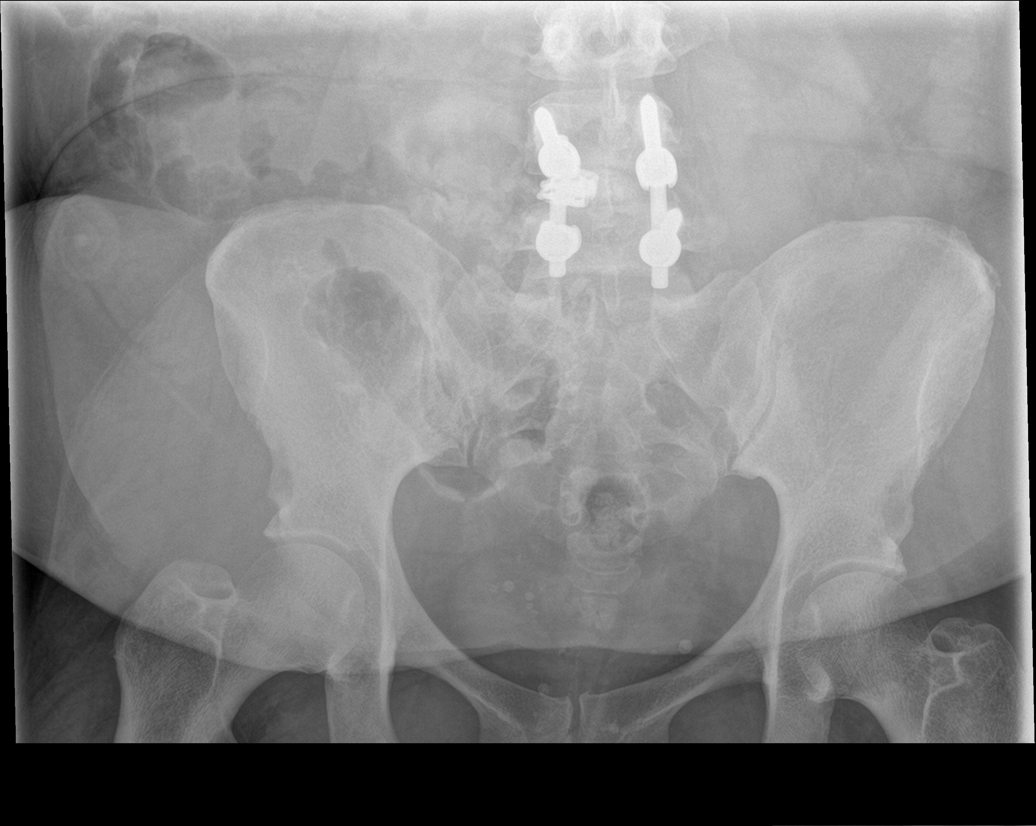

[4 of 4 positions shown; findings below may reference images not displayed]

FINDINGS: Tracheostomy tube noted. Low lung volumes without edema or focal
consolidation. No pleural effusion. Cardiopericardial silhouette is
at upper limits of normal for size. The visualized bony structures
of the thorax show no acute abnormality.

Upright abdominal film does not include the hemidiaphragms no
evidence for free air on the chest x-ray. There is no evidence for
gaseous bowel dilation to suggest obstruction. Fixation hardware
noted lower lumbar spine. Surgical clips in the right upper quadrant
suggest prior cholecystectomy
IMPRESSION: 1. No acute cardiopulmonary findings.
2. No evidence for bowel perforation or obstruction.

## 2022-05-14 IMAGING — DX DG CHEST 1V PORT
1 series · 1 of 1 positions shown · non-contrast
Comparison: 08/22/2021

CLINICAL DATA: Epigastric pain

EXAM:
PORTABLE CHEST 1 VIEW

[chest ap]
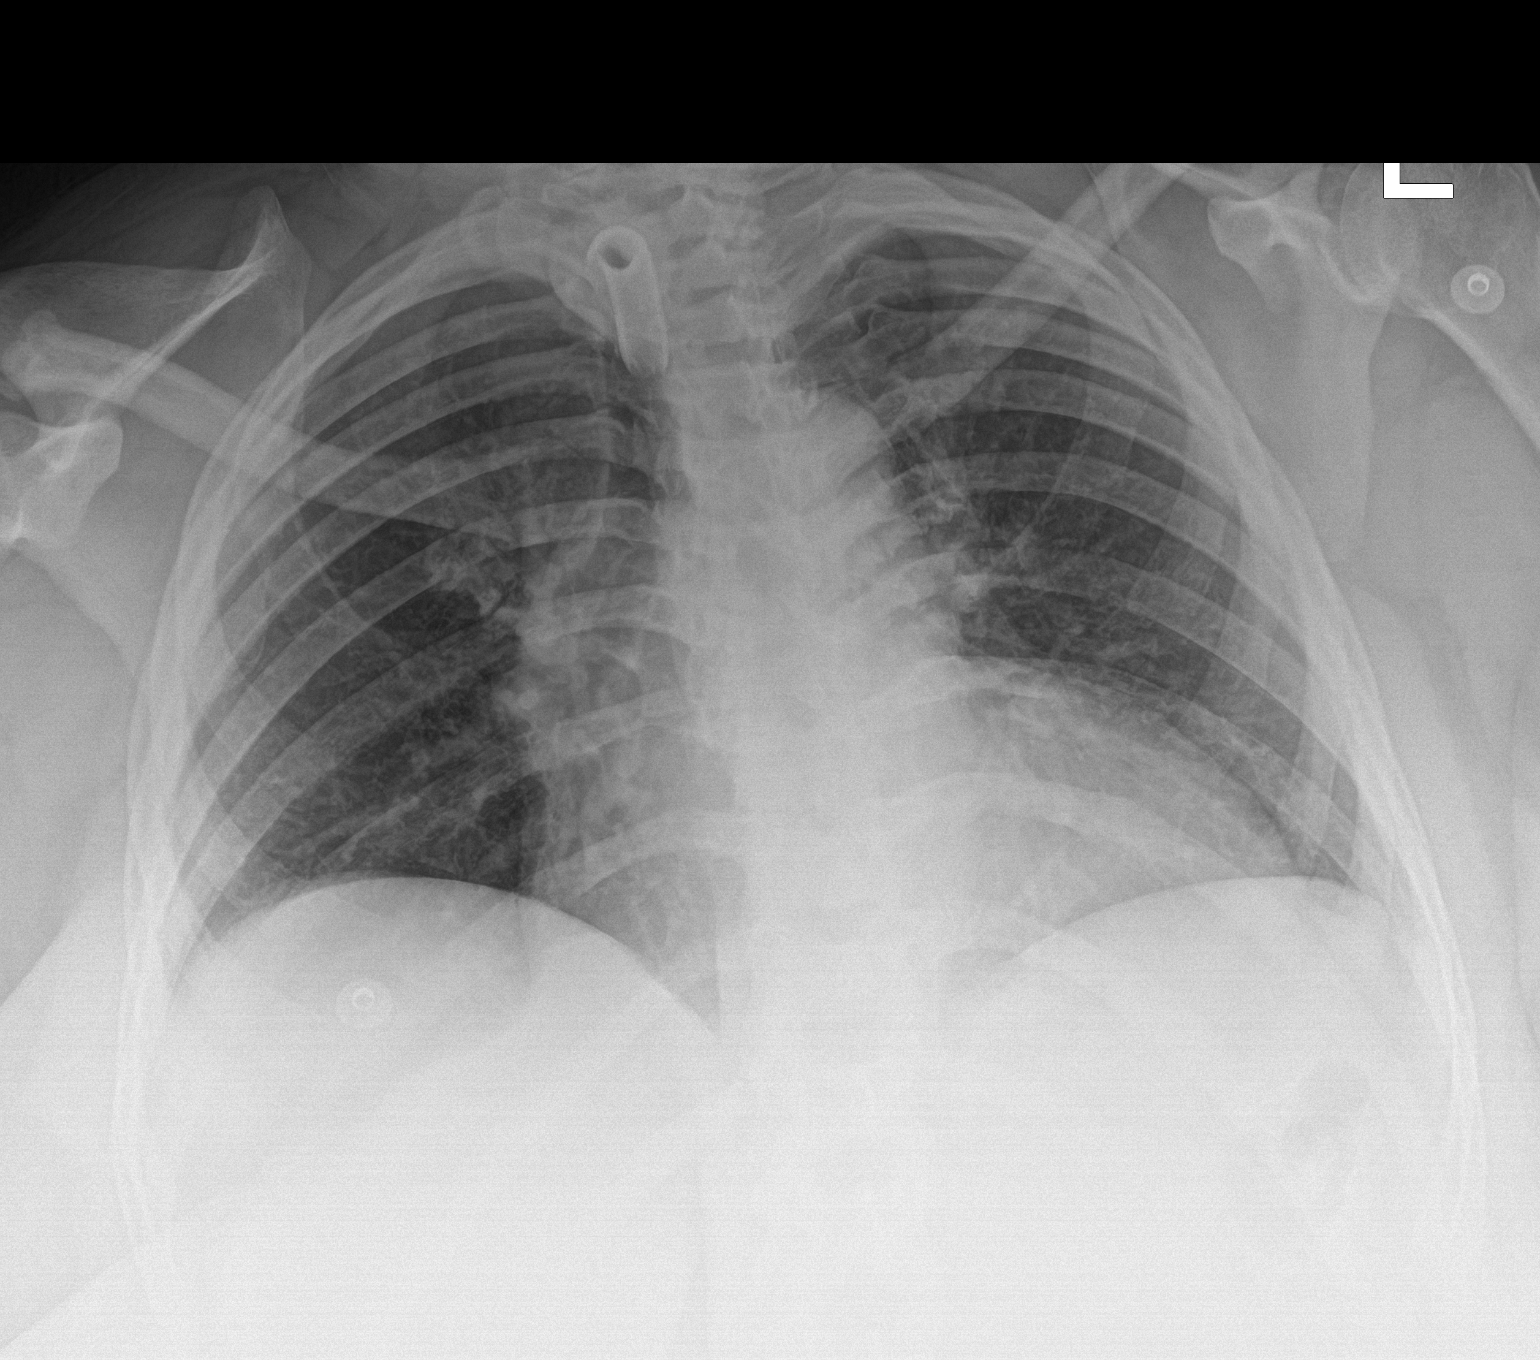

[1 of 1 positions shown; findings below may reference images not displayed]

FINDINGS: Stable tracheostomy tube. Stable heart size. Low lung volumes. No
focal airspace consolidation, pleural effusion, or pneumothorax.
IMPRESSION: No active disease.

## 2022-05-14 IMAGING — US US ABDOMEN LIMITED
1 series · 14 of 25 positions shown · non-contrast
Comparison: Sonogram done on 02/28/2014, CT abdomen done on
10/11/2021

CLINICAL DATA: Abdominal pain, nausea, vomiting

EXAM:
ULTRASOUND ABDOMEN LIMITED RIGHT UPPER QUADRANT

[Series 1: us abdomen limited ruq (liver/gb) · 41 acquisitions, 14 frames shown]
[im 1/41]
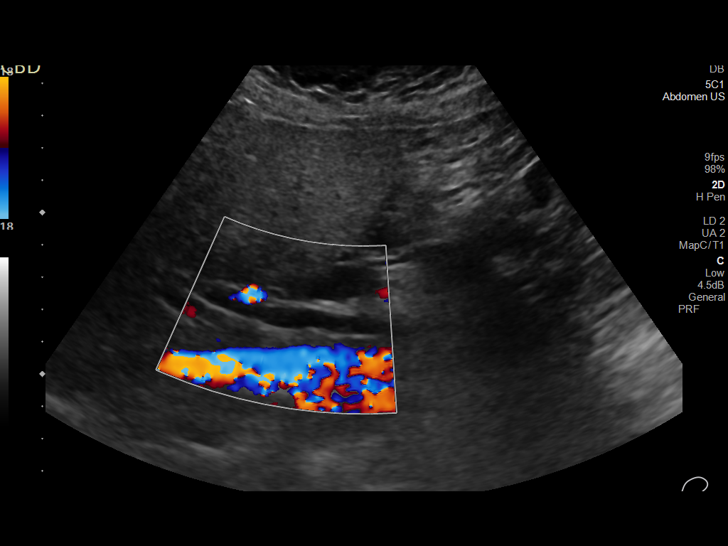
[im 4/41]
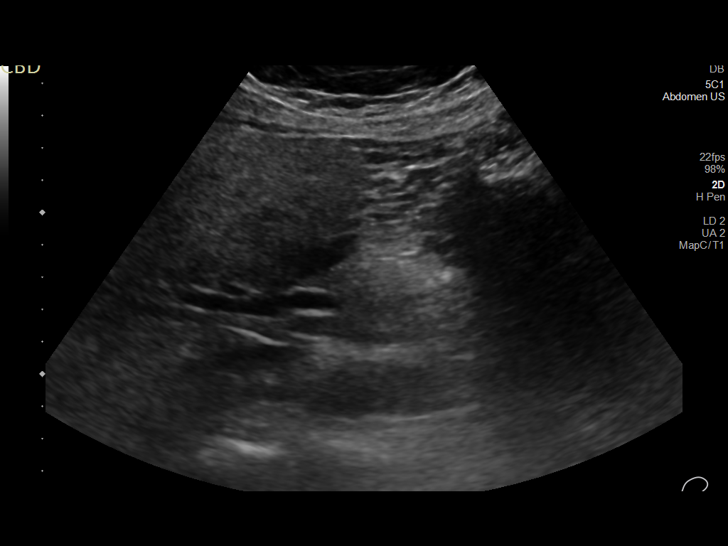
[im 7/41]
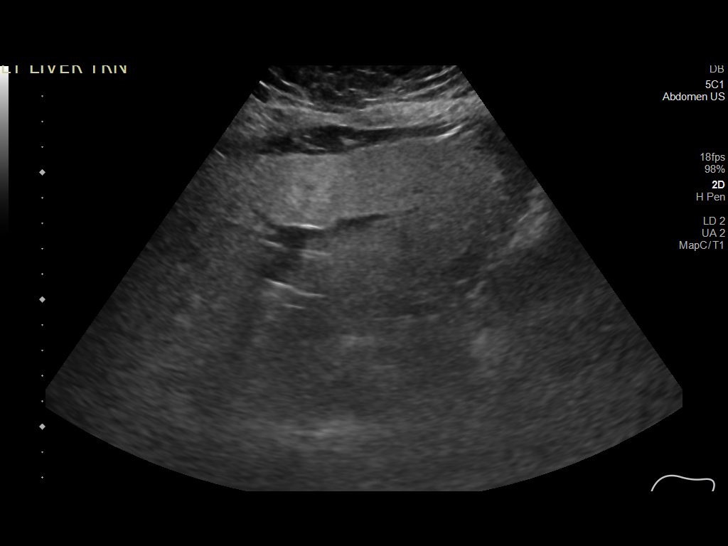
[im 11/41]
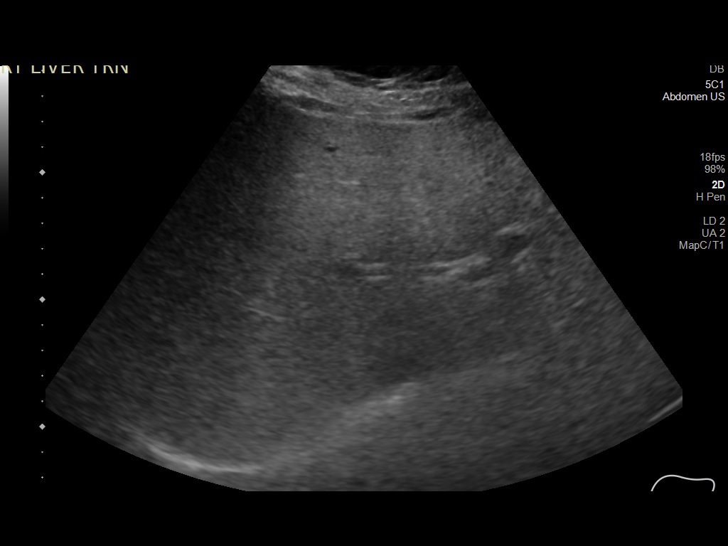
[im 14/41]
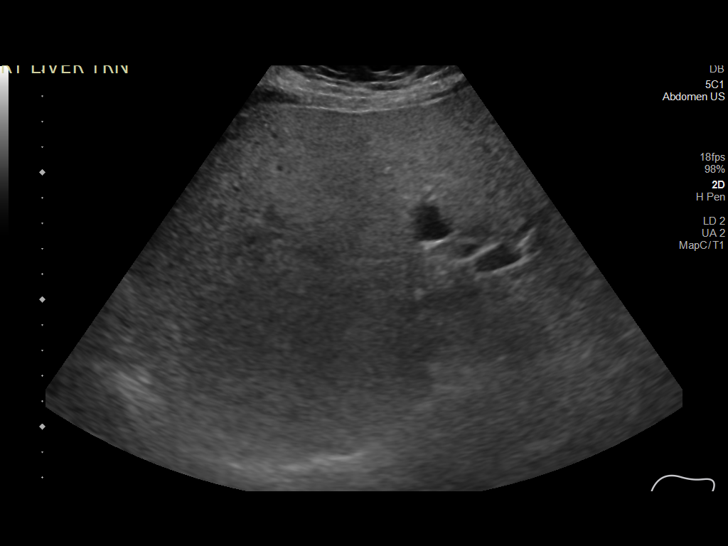
[im 16/41]
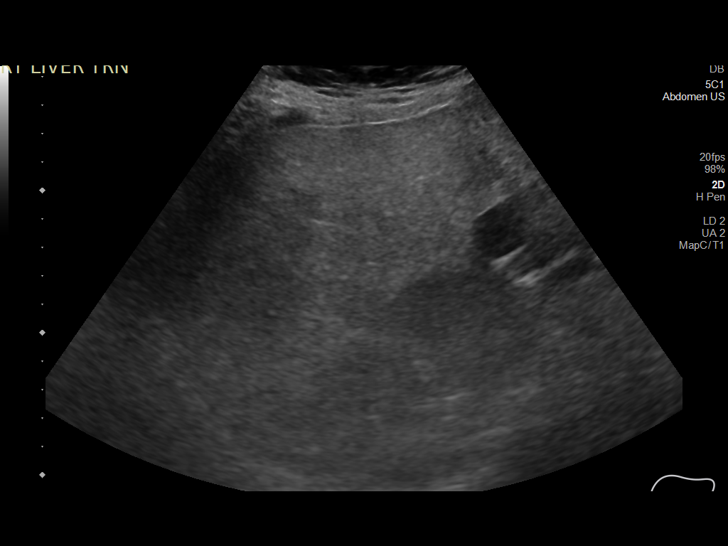
[im 19/41]
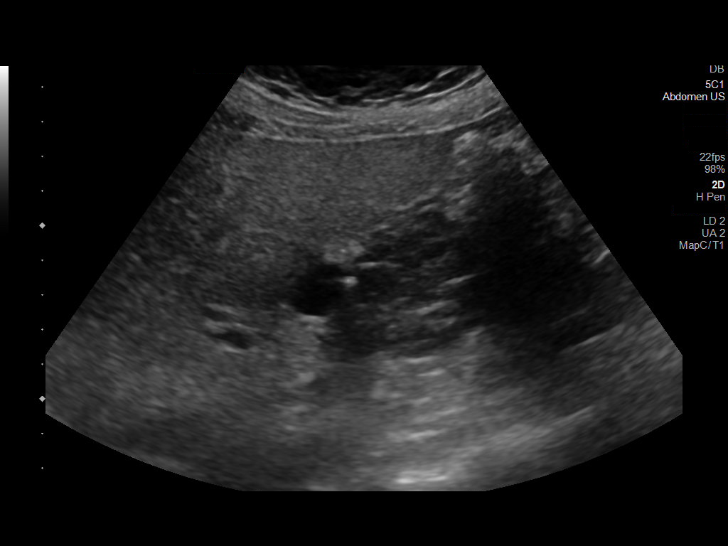
[im 22/41]
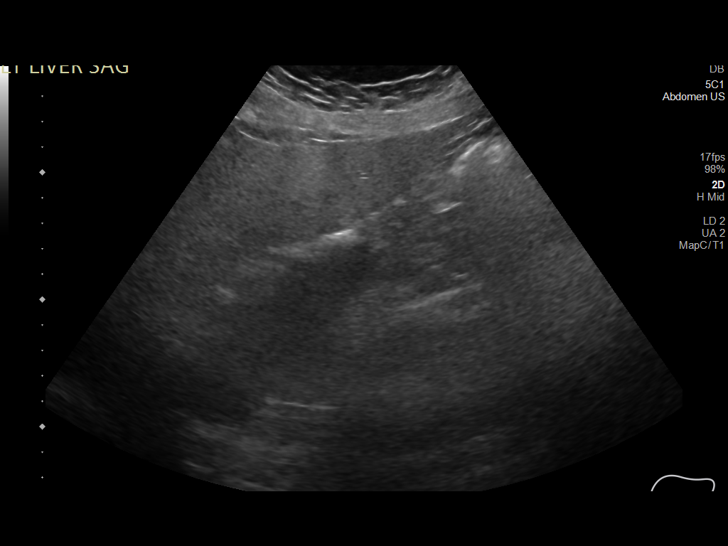
[im 26/41]
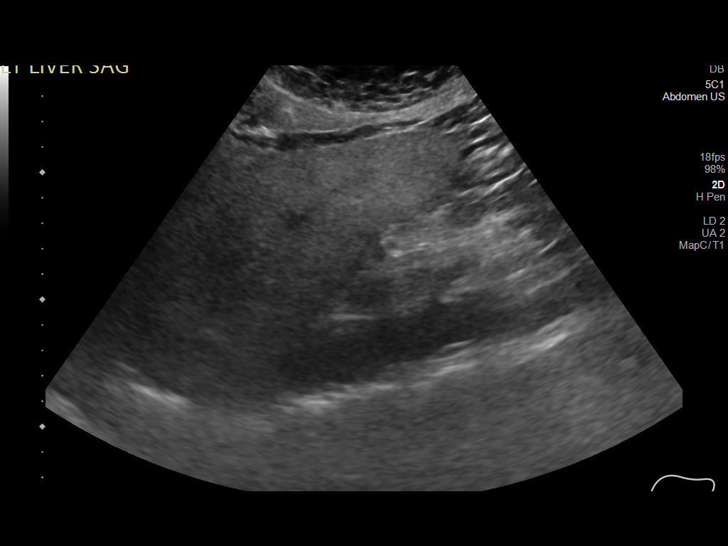
[im 27/41]
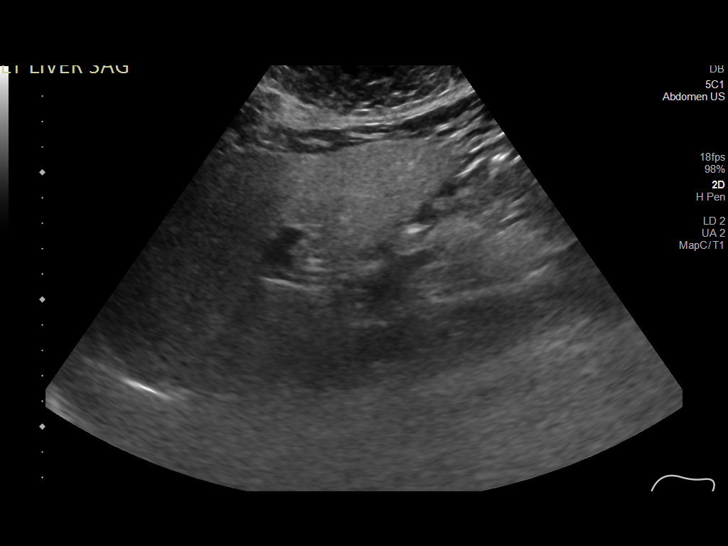
[im 31/41]
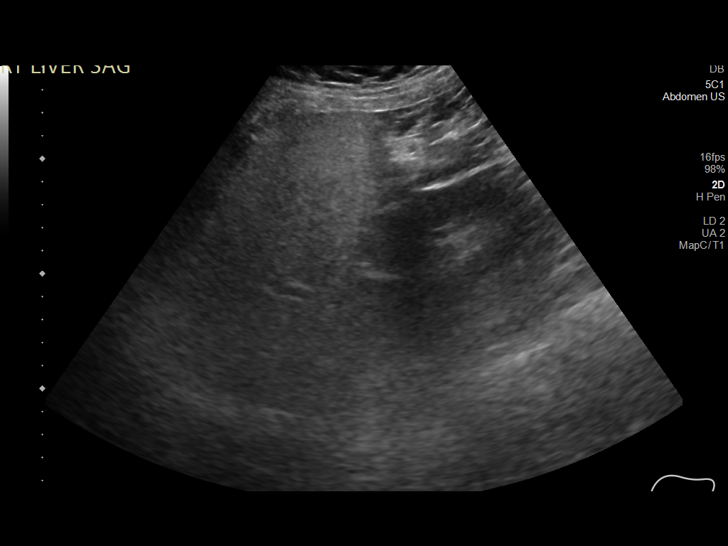
[im 34/41]
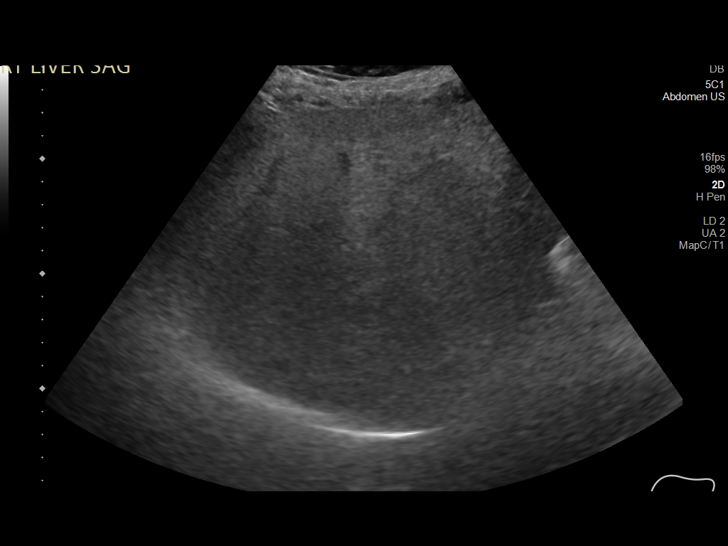
[im 37/41]
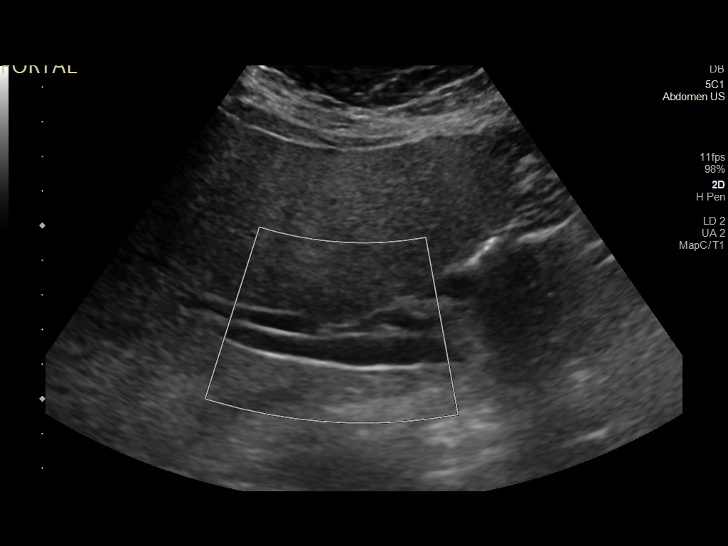
[im 41/41]
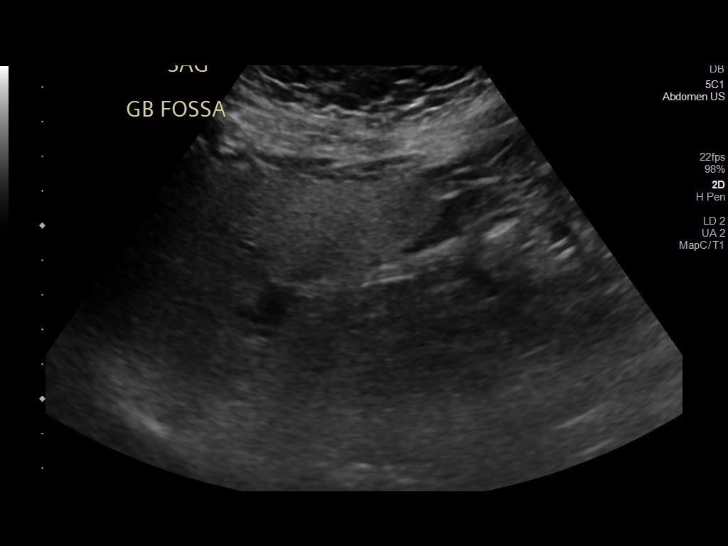

[14 of 25 positions shown; findings below may reference images not displayed]

FINDINGS: Gallbladder:

Gallbladder is not seen consistent with cholecystectomy.

Common bile duct:

Diameter: 7 mm

Liver:

There is increased echogenicity in the liver suggesting fatty
infiltration. There is 1.7 cm cystic structure in the porta hepatis,
possibly a hepatic cyst. This finding was not distinctly seen in the
previous CT. Portal vein is patent on color Doppler imaging with
normal direction of blood flow towards the liver.

Other: None.
IMPRESSION: Status post cholecystectomy.  Fatty liver.

## 2022-05-15 ENCOUNTER — Ambulatory Visit (INDEPENDENT_AMBULATORY_CARE_PROVIDER_SITE_OTHER): Payer: Medicare Other | Admitting: Specialist

## 2022-05-15 ENCOUNTER — Encounter: Payer: Self-pay | Admitting: Specialist

## 2022-05-15 VITALS — BP 154/97 | HR 102 | Ht 61.0 in | Wt 250.0 lb

## 2022-05-15 DIAGNOSIS — M961 Postlaminectomy syndrome, not elsewhere classified: Secondary | ICD-10-CM

## 2022-05-15 DIAGNOSIS — D219 Benign neoplasm of connective and other soft tissue, unspecified: Secondary | ICD-10-CM | POA: Diagnosis not present

## 2022-05-15 DIAGNOSIS — Z981 Arthrodesis status: Secondary | ICD-10-CM | POA: Diagnosis not present

## 2022-05-15 DIAGNOSIS — M4316 Spondylolisthesis, lumbar region: Secondary | ICD-10-CM

## 2022-05-15 IMAGING — DX DG ABDOMEN 1V
1 series · 2 of 2 positions shown · non-contrast
Comparison: None.

CLINICAL DATA: Abdominal pain

EXAM:
ABDOMEN - 1 VIEW

[Series 1: abdomen · 0.14mm/px · 2 of 2 slices shown]
[im 1/2]
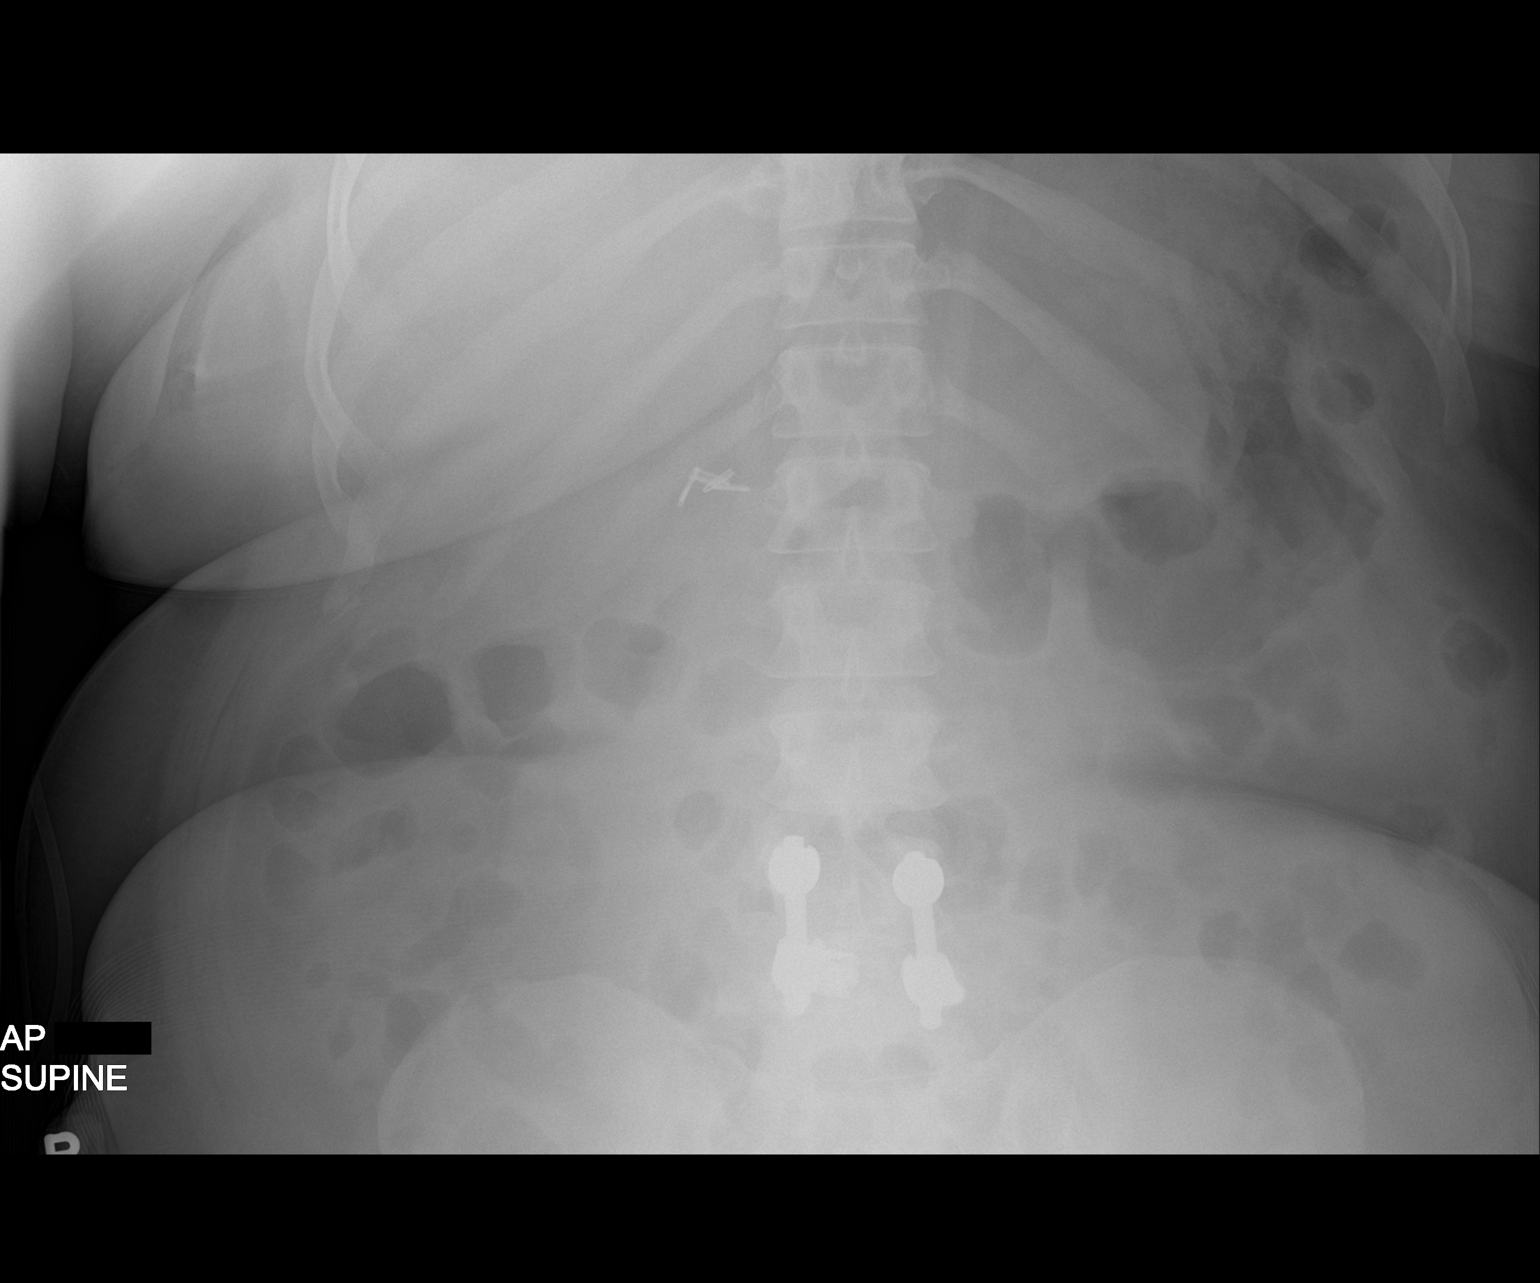
[im 2/2]
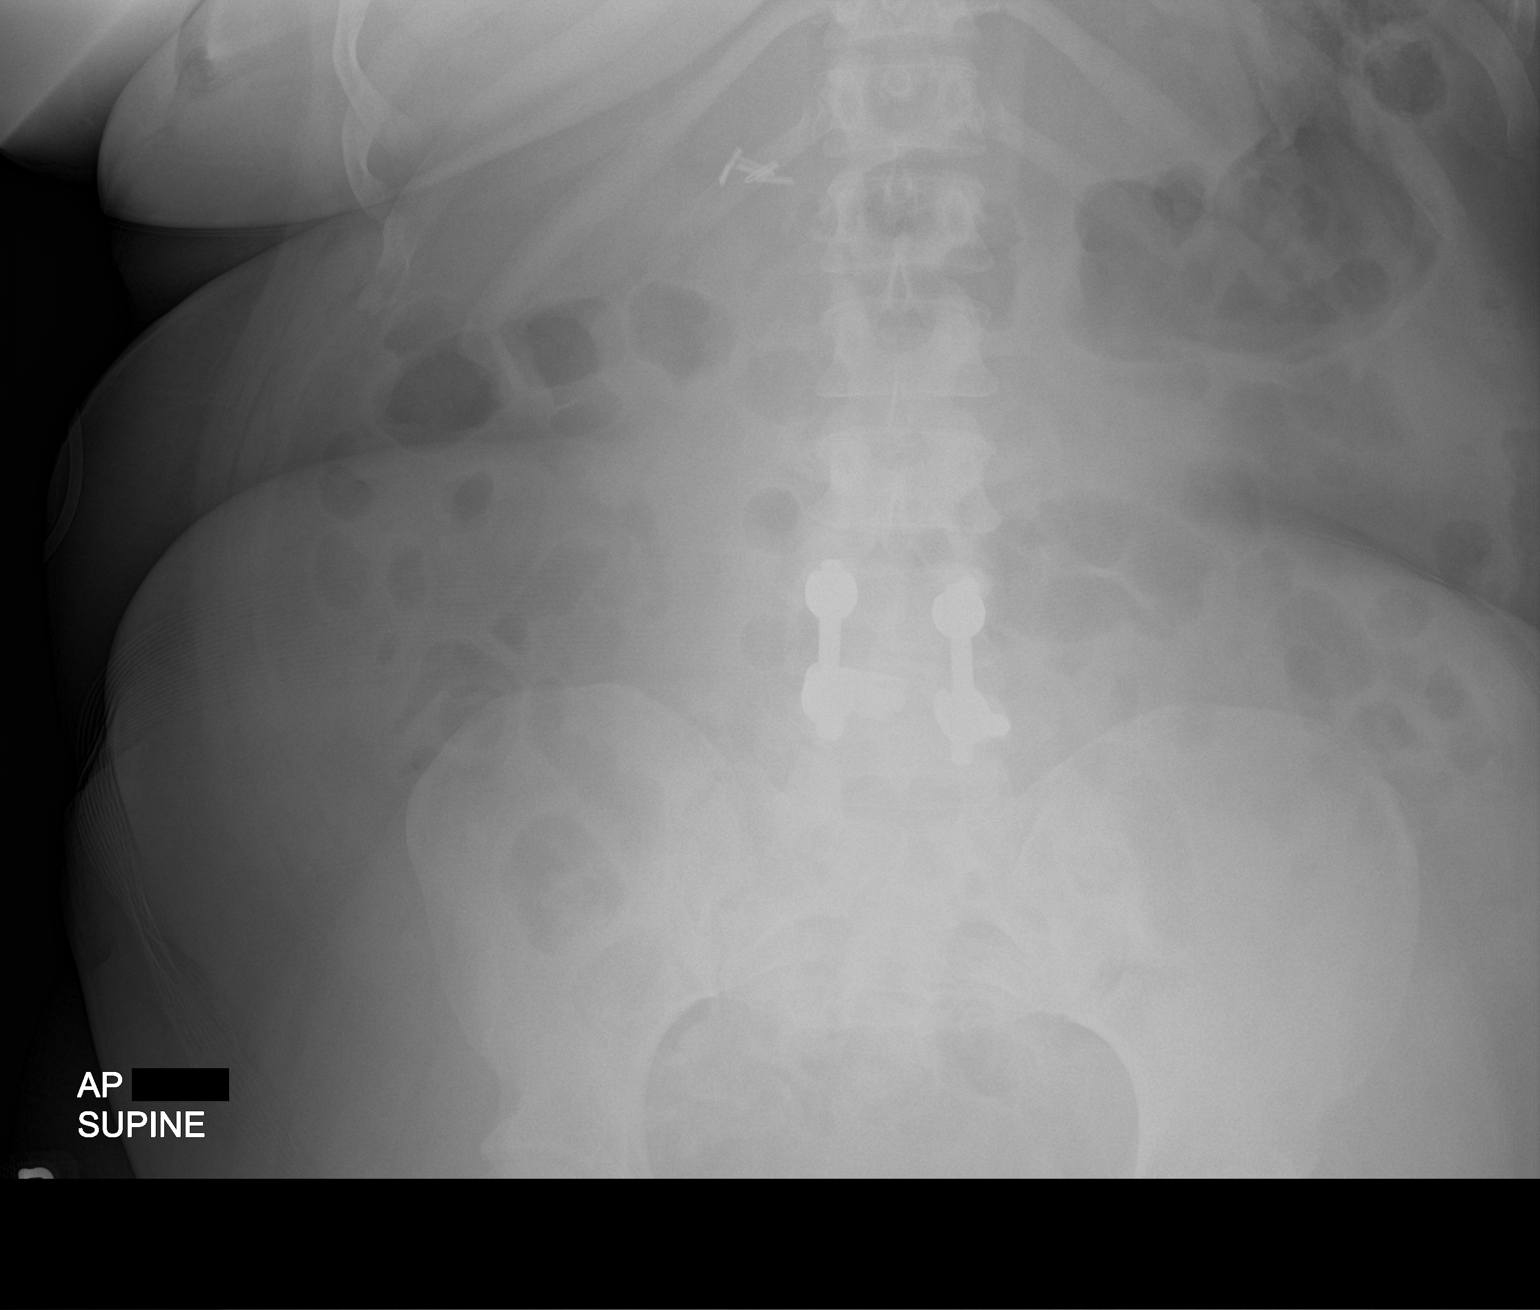

[2 of 2 positions shown; findings below may reference images not displayed]

FINDINGS: Normal abdominal gas pattern. No gross free intraperitoneal gas. No
organomegaly varicosed ectomy clips noted within the right upper
quadrant. L4-5 lumbar fusion with instrumentation has been
performed. No acute bone abnormality.
IMPRESSION: Normal abdominal gas pattern.

## 2022-05-15 NOTE — Patient Instructions (Signed)
Plan: Avoid frequent bending and stooping  No lifting greater than 10 lbs. May use ice or moist heat for pain. Weight loss is of benefit. Heat moist heat in the form of a shower twice daily. Ultrasound of the soft tissue mass is ordered to assess for fluid collection vs fibroma. Exercise is important to improve your indurance and does allow people to function better inspite of back pain.

## 2022-05-15 NOTE — Progress Notes (Addendum)
Office Visit Note   Patient: [name]           Date of Birth: Feb 14, 1971           MRN: [id number] Visit Date: 05/15/2022              Requested by: [name], MD [address],  [place] [address] PCP: [name], MD   Assessment & Plan: Visit Diagnoses:  1. Post laminectomy syndrome   2. S/P lumbar fusion   3. Spondylolisthesis of lumbar region   4. Fibroma     Plan: Avoid frequent bending and stooping  No lifting greater than 10 lbs. May use ice or moist heat for pain. Weight loss is of benefit. Heat moist heat in the form of a shower twice daily. Ultrasound of the soft tissue mass is ordered to assess for fluid collection vs fibroma. Exercise is important to improve your indurance and does allow people to function better inspite of back pain.    Follow-Up Instructions: Return in about 4 weeks (around 06/12/2022).   Orders:  Orders Placed This Encounter  Procedures   [place] Spine   No orders of the defined types were placed in this encounter.     Procedures: No procedures performed   Clinical Data: No additional findings.   Subjective: Chief Complaint  Patient presents with   Lower Back - Follow-up    51 year old female with history of lumbar fusion L4-5 for spondylolisthesis. She is still painful and is notices a lump at the upper lumbar incision site. No  bowel or bladder difficulty. There is pain with standing and walking. The area of the upper lumbar incision is swollen and this is worse at times and painful.    Review of Systems  Constitutional: Negative.   HENT: Negative.    Eyes: Negative.   Respiratory: Negative.    Cardiovascular: Negative.   Gastrointestinal: Negative.   Endocrine: Negative.   Genitourinary: Negative.   Musculoskeletal: Negative.   Skin: Negative.   Allergic/Immunologic: Negative.   Neurological: Negative.   Hematological: Negative.   Psychiatric/Behavioral: Negative.        Objective: Vital Signs: BP (!) 154/97   Pulse (!) 102   Ht '5\' 1"'$  (1.549 m)   Wt 250 lb (113.4 kg)   LMP  (LMP Unknown)   BMI 47.24 kg/m   Physical Exam Constitutional:      Appearance: She is well-developed.  HENT:     Head: Normocephalic and atraumatic.  Eyes:     Pupils: Pupils are equal, round, and reactive to light.  Pulmonary:     Effort: Pulmonary effort is normal.     Breath sounds: Normal breath sounds.  Abdominal:     General: Bowel sounds are normal.     Palpations: Abdomen is soft.  Musculoskeletal:     Cervical back: Normal range of motion and neck supple.  Skin:    General: Skin is warm and dry.  Neurological:     Mental Status: She is alert and oriented to person, place, and time.  Psychiatric:        Behavior: Behavior normal.        Thought Content: Thought content normal.        Judgment: Judgment normal.    Back Exam   Tenderness  The patient is experiencing tenderness in the lumbar.  Range of Motion  Extension:  abnormal  Flexion:  abnormal  Lateral bend right:  abnormal  Lateral bend left:  abnormal  Rotation right:  abnormal  Rotation left:  abnormal   Muscle Strength  Right Quadriceps:  5/5  Left Quadriceps:  5/5  Right Hamstrings:  5/5   Comments:  Lumbar incision with swelling and thickened subcutaneous mass above the incision scar area. Incision with no open areas.     Specialty Comments:  MRI LUMBAR SPINE WITHOUT AND WITH CONTRAST   TECHNIQUE: Multiplanar and multiecho pulse sequences of the lumbar spine were obtained without and with intravenous contrast.   CONTRAST:  84m MULTIHANCE GADOBENATE DIMEGLUMINE 529 MG/ML IV SOLN   COMPARISON:  Lumbar radiographs 01/02/2022, lumbar spine MRI 12/09/2020   FINDINGS: Segmentation: Standard; the lowest formed disc space is designated L5-S1.   Alignment: There is straightening of the normal lumbar lordosis. There is no antero or retrolisthesis.   Vertebrae: There has  been interval posterior instrumented fusion at L4-L5 with suspected right laminectomy. There is no evidence of complication. Marrow signal is otherwise normal. There is no suspicious marrow signal abnormality or marrow edema. There is no abnormal marrow enhancement.   Conus medullaris and cauda equina: Conus extends to the L1 level. Conus and cauda equina appear normal. There is no abnormal enhancement of the cauda equina nerve roots.   Paraspinal and other soft tissues: Postsurgical changes are noted in the soft tissues posterior to the surgical levels. The paraspinal soft tissues are otherwise unremarkable.   Disc levels:   The disc heights at the nonsurgical levels are preserved.   T12-L1: No significant spinal canal or neural foraminal stenosis   L1-L2: No significant spinal canal or neural foraminal stenosis   L2-L3: No significant spinal canal or neural foraminal stenosis   L3-L4: There is a minimal disc bulge without significant spinal canal or neural foraminal stenosis   L4-L5: Status post posterior instrumented fusion and suspected right laminectomy. The previously seen disc protrusion has resolved following surgery. There is residual mild narrowing of the left subarticular zone without evidence of frank nerve root impingement. There is no residual spinal canal or neural foraminal stenosis. There is no convincing evidence of nerve root impingement or significant neural foraminal stenosis.   L5-S1: No significant spinal canal or neural foraminal stenosis.   IMPRESSION: 1. Status post posterior instrumented fusion and suspected right laminectomy at L4-L5. The previously seen disc protrusion on the study from 12/19/2020 is no longer present, and previously seen spinal canal stenosis has significantly improved. There is residual mild narrowing of the left subarticular zone without evidence of frank nerve root impingement. No other convincing evidence of nerve root  impingement to explain the patient's right-sided symptoms. 2. No significant spinal canal or neural foraminal stenosis at the other levels.     Electronically Signed   By: PValetta MoleM.D.   On: 01/16/2022 13:29  Imaging: No results found.   PMFS History: Patient Active Problem List   Diagnosis Date Noted   Spondylolisthesis, lumbar region 08/20/2021    Priority: High    Class: Chronic   Abnormal UGI series    Coffee ground emesis 10/15/2021   Gastritis and gastroduodenitis    Coffee ground vomiting 10/14/2021   Fusion of spine of lumbar region 08/20/2021   Chronic respiratory failure with hypoxia ([place] 06/20/2021   Preop pulmonary/respiratory exam 06/20/2021   Pain of upper abdomen 11/28/2020   Recurrent vomiting 11/28/2020   COVID-19 09/16/2020   Dyspnea 08/26/2020   Rotator cuff tear 07/28/2019   Insulin dependent type 2 diabetes mellitus ([place] 04/12/2019  Lactic acidosis 03/22/2019   Chronic right shoulder pain    Community acquired pneumonia 10/25/2018   Asthma, chronic, unspecified asthma severity, with acute exacerbation 10/25/2018   Acute respiratory failure with hypoxia and hypercapnia (HCC) 10/23/2018   Acute pain of right shoulder due to trauma 10/05/2018   Chronic bilateral low back pain without sciatica 10/05/2018   Dysphonia 10/05/2018   Severe asthma with exacerbation 10/05/2018   Rotator cuff arthropathy, right 06/15/2018   AKI (acute kidney injury) ([place]) 03/06/2018   Carpal tunnel syndrome 01/18/2018   Heart failure ([place]) 05/20/2017   Normocytic anemia 05/06/2017   Tracheostomy dependent ([place]) 03/26/2017   Dysphagia 03/26/2017   Klebsiella cystitis 03/14/2017   Morbid obesity with BMI of 40.0-44.9, adult ([place]) 03/13/2017   Subglottic stenosis 03/06/2017   Asthma 03/06/2017   Chronic diastolic heart failure ([place]) 03/06/2017   Tracheal stenosis 03/04/2017   Acute blood loss anemia    Generalized anxiety disorder    Chronic pain syndrome     GERD (gastroesophageal reflux disease) 02/22/2016   Depression 02/22/2016   Migraine 01/30/2015   Bulging lumbar disc 09/05/2013   Essential hypertension 09/05/2013   Allergic rhinitis, seasonal 08/11/2012   Chronic cough 08/11/2012   OSA (obstructive sleep apnea) 12/04/2011   Past Medical History:  Diagnosis Date   Allergy    Anxiety    Arthritis    Asthma    Chest pain of uncertain etiology 9/56/2130   Chronic back pain    Chronic chest pain    resolved, no problems since 2019 per patient 07/27/19   COPD (chronic obstructive pulmonary disease) (HCC)    Depression    DM (diabetes mellitus) ([place])    INSULIN DEPENDENT - TYPE 1   Dyspnea    GERD (gastroesophageal reflux disease)    Headache(784.0)    History of hiatal hernia    Hyperlipidemia    no med, diet controlled   Hypertension    Hypokalemia    Pneumonia    Respiratory disease 05/2017   uses inhalers, neb tx prn, no oxygen   Sleep apnea    does not use CPAP due to trach   Status post tracheostomy ([place]) 03/14/2017   Last Assessment & Plan:  Formatting of this note might be different from the original. - [place] - On tach - ENT following along (please see above for current trach management plan)   Tracheostomy in place [facility]) 02/2017    Family History  Problem Relation Age of Onset   Heart attack Mother    Stroke Mother    Diabetes Mother    Hypertension Mother    Arthritis Mother    Stroke Father    Asthma Sister    Hypertension Sister    Asthma Sister    Diabetes Sister    Asthma Brother    Seizures Brother    Asthma Brother    Diabetes Brother    Asthma Daughter    Asthma Son    Asthma Grandson    Colon cancer Neg Hx    Rectal cancer Neg Hx    Stomach cancer Neg Hx    Esophageal cancer Neg Hx    Ovarian cancer Neg Hx    Pancreatic cancer Neg Hx     Past Surgical History:  Procedure Laterality Date   ABDOMINAL HYSTERECTOMY  2009   APPENDECTOMY     BIOPSY   10/15/2021   Procedure: BIOPSY;  Surgeon: Thornton Park, MD;  Location: [facility];  Service: Gastroenterology;;   CESAREAN SECTION     x 3   CHOLECYSTECTOMY N/A 03/02/2014   Procedure: LAPAROSCOPIC CHOLECYSTECTOMY;  Surgeon: Joyice Faster. Cornett, MD;  Location: [facility];  Service: General;  Laterality: N/A;   ESOPHAGOGASTRODUODENOSCOPY (EGD) WITH PROPOFOL N/A 10/15/2021   Procedure: ESOPHAGOGASTRODUODENOSCOPY (EGD) WITH PROPOFOL;  Surgeon: Thornton Park, MD;  Location: [facility];  Service: Gastroenterology;  Laterality: N/A;   HERNIA REPAIR     PANENDOSCOPY N/A 03/04/2017   Procedure: PANENDOSCOPY WITH POSSIBLE FOREIGN BODY REMOVAL;  Surgeon: Jodi Marble, MD;  Location: [facility];  Service: ENT;  Laterality: N/A;   ROTATOR CUFF REPAIR Left    ROTATOR CUFF REPAIR Right 07/27/2019   SHOULDER ARTHROSCOPY WITH SUBACROMIAL DECOMPRESSION, ROTATOR CUFF REPAIR AND BICEP TENDON REPAIR Right 07/28/2019   Procedure: RIGHT SHOULDER ARTHROSCOP, MINI OPEN ROTATOR CUFF TEAR REPAIR,  BICEPS TENODESIS, DISTAL CLAVICLE EXCISION;  Surgeon: Meredith Pel, MD;  Location: [facility];  Service: Orthopedics;  Laterality: Right;   TOOTH EXTRACTION N/A 02/22/2021   Procedure: DENTAL RESTORATION/EXTRACTIONS;  Surgeon: Diona Browner, DMD;  [facility];  Service: Oral Surgery;  Laterality: N/A;   TRACHEOSTOMY  02/2017   TUBAL LIGATION     VESICOVAGINAL FISTULA CLOSURE W/ TAH  2009   Social History   Occupational History   Not on file  Tobacco Use   Smoking status: Former    Packs/day: 0.50    Years: 25.00    Total pack years: 12.50    Types: Cigarettes    Quit date: 01/21/2016    Years since quitting: 6.3   Smokeless tobacco: Never  Vaping Use   Vaping Use: Never used  Substance and Sexual Activity   Alcohol use: Yes    Comment: social wine   Drug use: Not Currently    Types: Marijuana    Comment: couple times a month   Sexual activity: Yes    Partners: Male    Birth control/protection: Surgical     Comment: Hysterectomy

## 2022-05-17 ENCOUNTER — Other Ambulatory Visit: Payer: Self-pay | Admitting: Family Medicine

## 2022-05-17 DIAGNOSIS — R112 Nausea with vomiting, unspecified: Secondary | ICD-10-CM

## 2022-05-18 IMAGING — CR DG UGI W SINGLE CM
10 of 13 series · 12 of 24 positions shown · non-contrast
Comparison: None.

CLINICAL DATA: Epigastric and atypical chest pain.  Vomiting.

EXAM:
DG UGI W SINGLE CM
TECHNIQUE: Scout radiograph was obtained. Single contrast examination was
performed using thin liquid barium. This exam was performed by Lonnie
Tailson, NP, and was supervised and interpreted by Dr. Lis[place]ia Mundarain.
FLUOROSCOPY TIME:  Radiation Exposure Index (as provided by the
fluoroscopic device): 45.9 mGy

[cp_standard (1 of 9) · tomo slice 54/107.0]
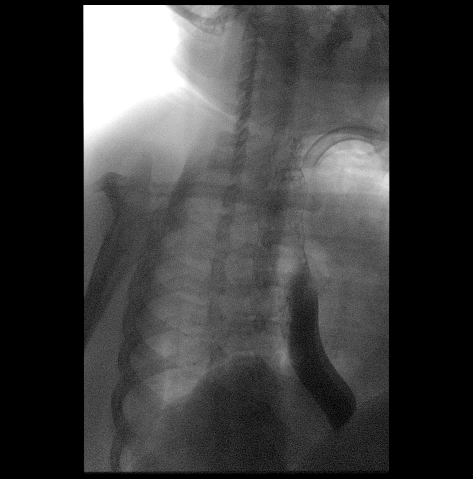

[cp_standard (2 of 9) · tomo slice 60/398.0]
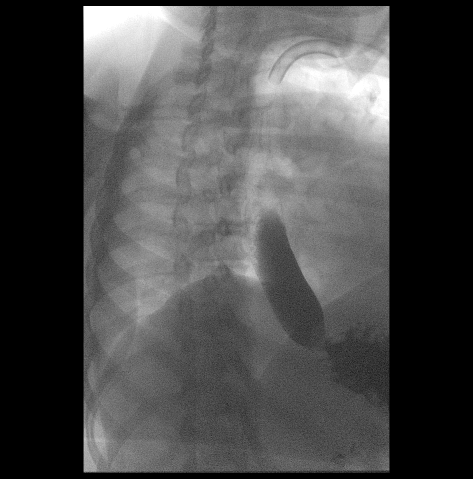

[Series 4: cp_standard · 0.53mm/px · 2 of 287 frames shown (3 of 9)]
[frame 44/287]
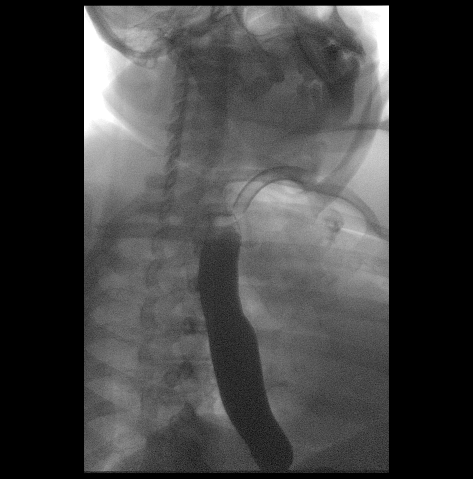
[frame 244/287]
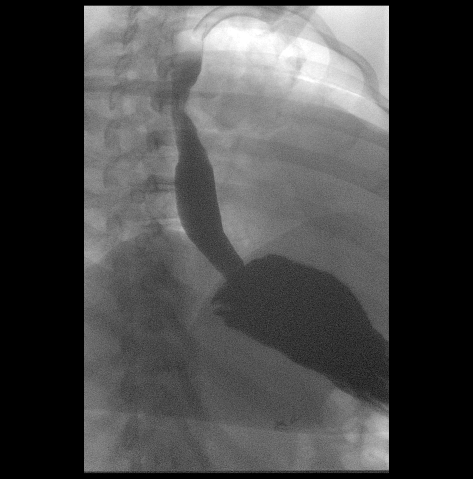

[cp_standard (4 of 9)]
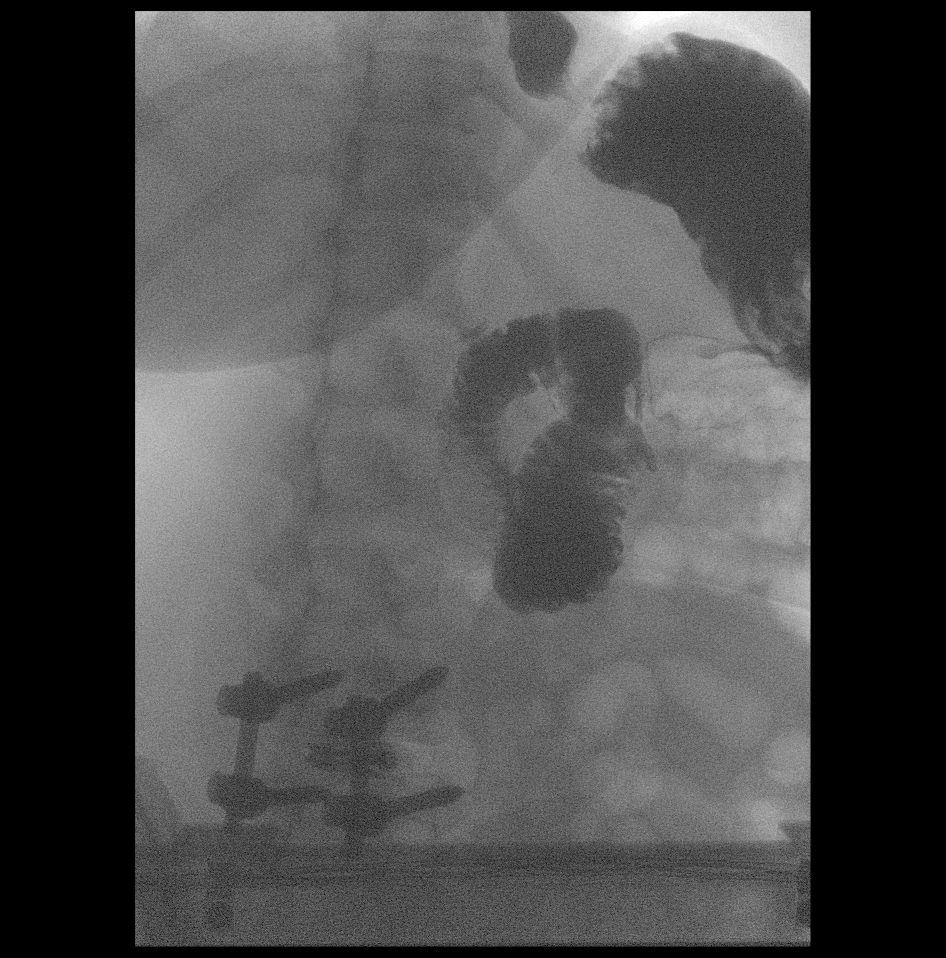

[cp_standard (5 of 9) · tomo slice 67/88.0]
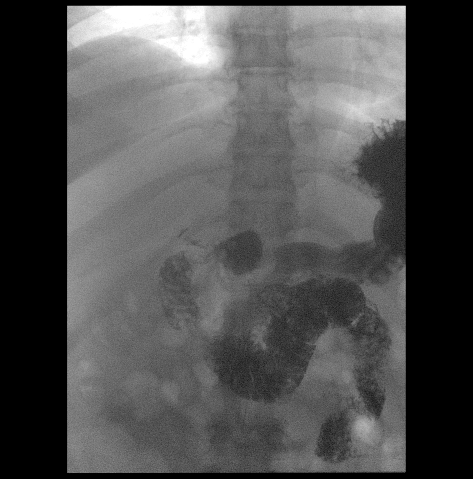

[cp_standard (6 of 9) · tomo slice 19/86.0]
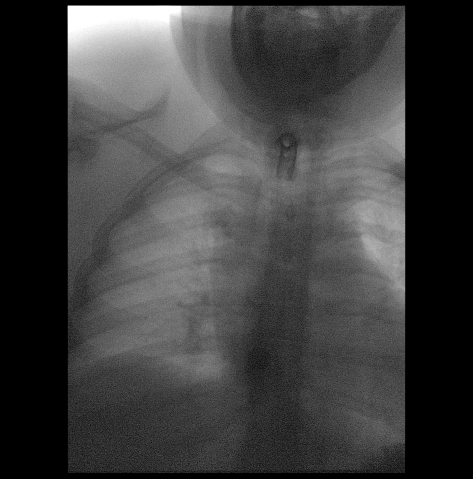

[cp_standard (7 of 9) · tomo slice 61/121.0]
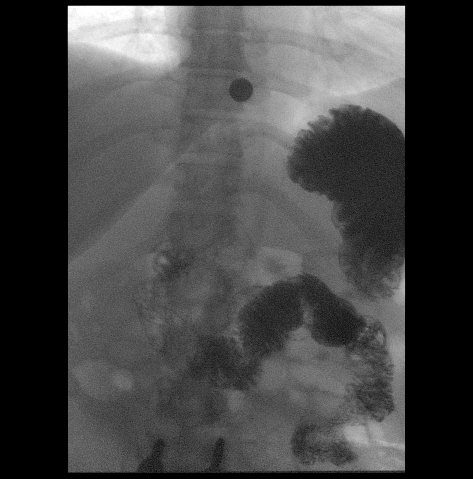

[cp_standard (8 of 9) · tomo slice 11/70.0]
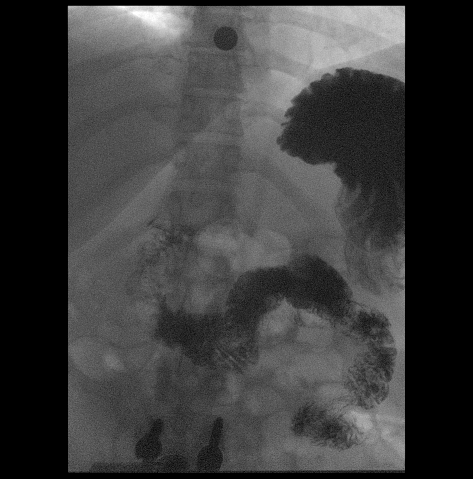

[Series 14: cp_standard · 0.51mm/px · 2 of 68 frames shown (9 of 9)]
[frame 11/68]
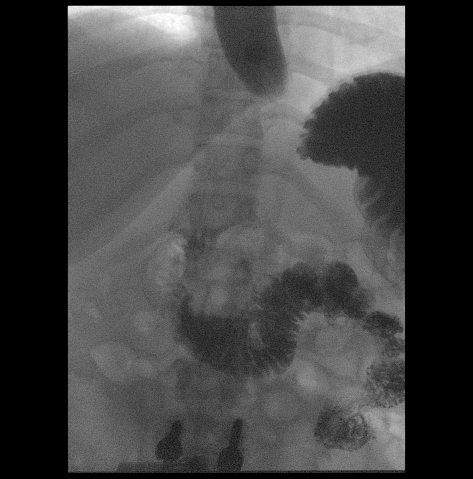
[frame 58/68]
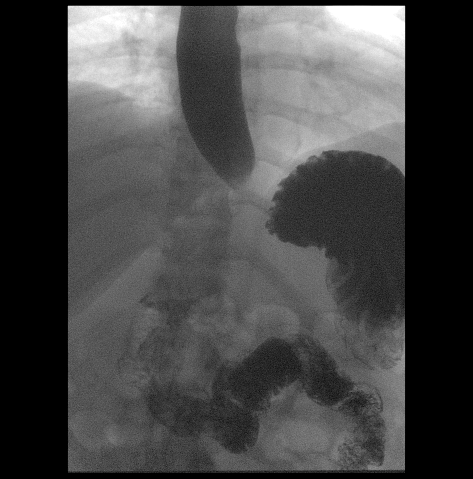

[fluoro_barium singleshot_bw]
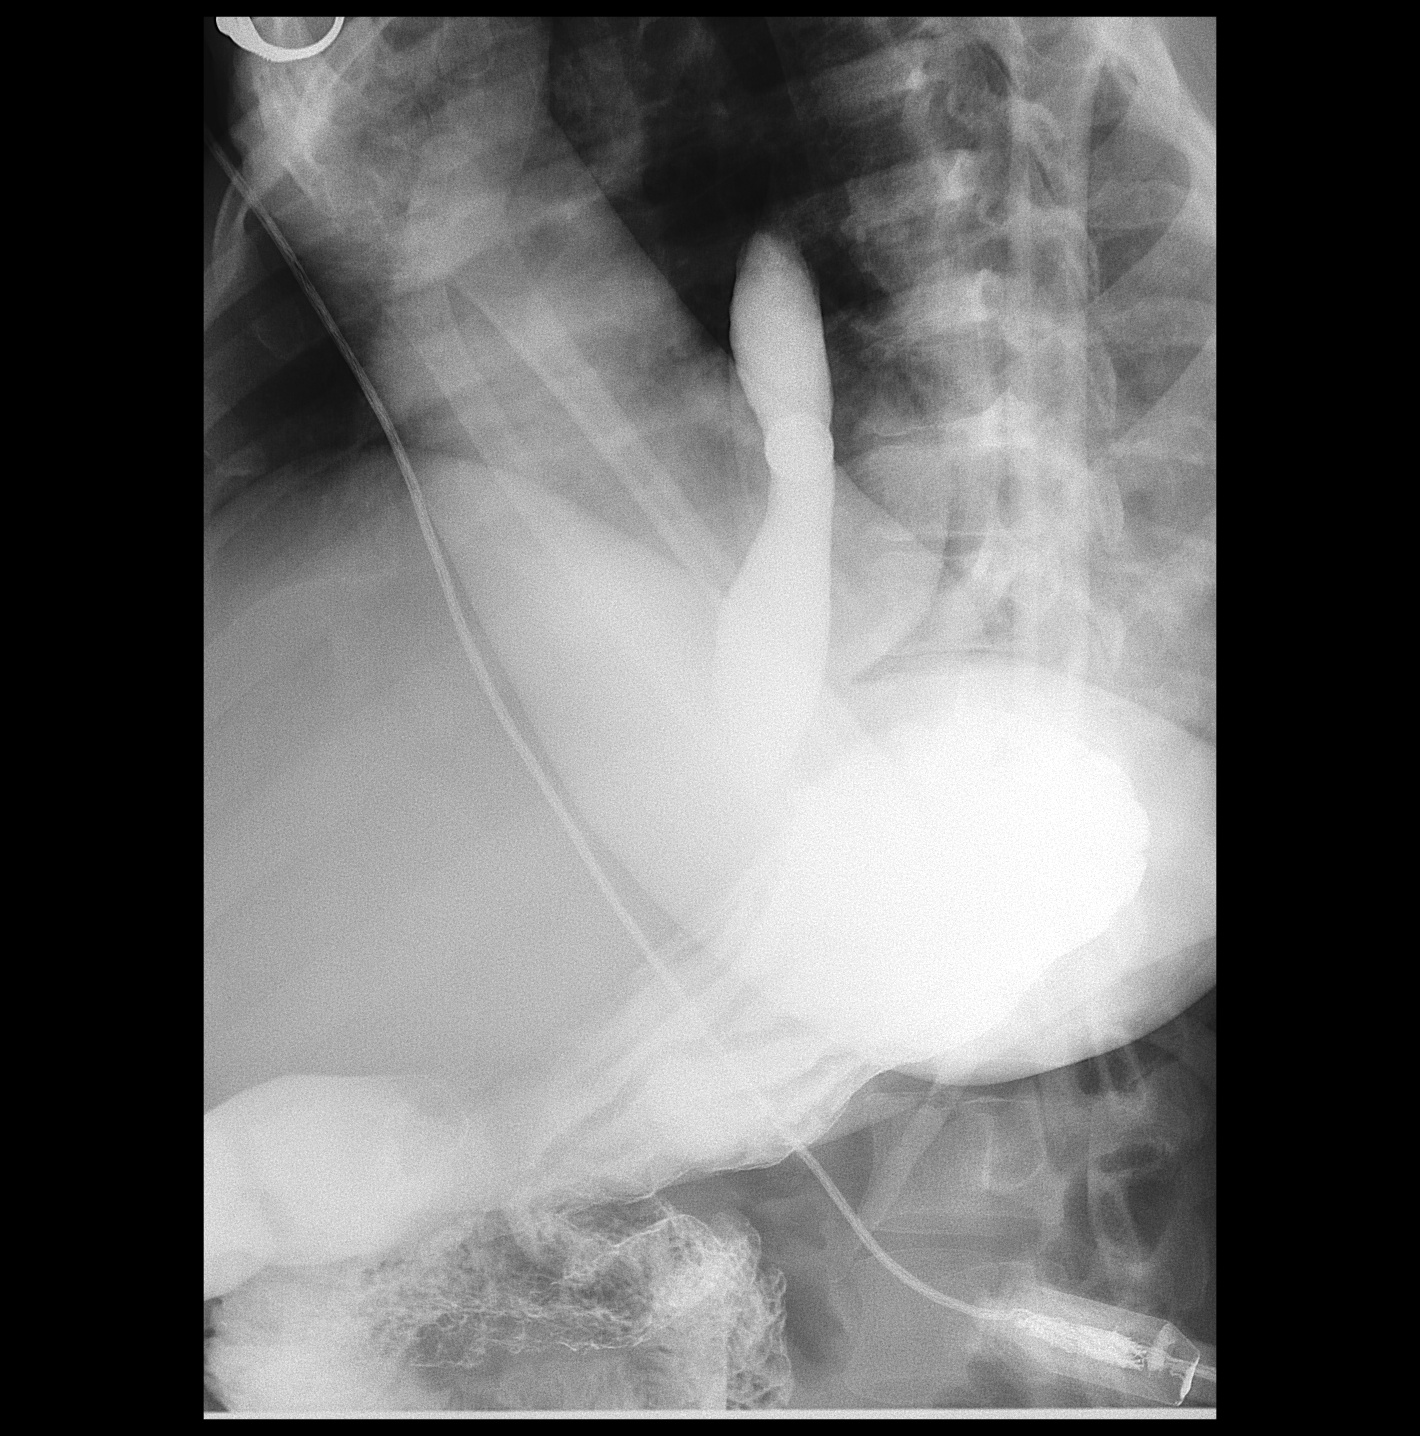

[12 of 24 positions shown; findings below may reference images not displayed]

FINDINGS: The scout radiograph demonstrates gas in nondilated small and large
bowel without evidence of obstruction. There is a small colonic
stool volume. Right upper quadrant abdominal surgical clips and L4-5
fusion are noted.

A limited single contrast upper GI was performed due to limited
patient mobility. The study was largely performed with the patient
in the recumbent position, and a full range of obliquities could not
be utilized for assessment of the esophagus, stomach, and duodenum.

Limited assessment of esophageal motility demonstrated decreased
primary peristalsis and some stasis of contrast in the esophagus
with occasional tertiary contractions. The esophagus distended
normally without evidence of a high-grade stricture or gross mass.
The stomach was normal in position and contour without evidence of a
gastric outlet obstruction or hiatal hernia. The duodenum was normal
in appearance with a normal position of the duodenal-jejunal
junction.

No gastroesophageal reflux was observed although provocative
maneuvers were not performed. A barium tablet arrested in the distal
esophagus and would not pass into the stomach despite administration
of water and additional barium. The GE junction did not relax fully
while monitoring for passage of the tablet, however it opened more
fully during earlier portions of the examination.
IMPRESSION: 1. Limited, single contrast upper GI.
2. Barium tablet arrested in the distal esophagus and did not pass
into the stomach despite administration of additional barium. A mild
stricture is not excluded.
3. No evidence of a hiatal hernia or gastric outlet obstruction.
4. Mild nonspecific esophageal dysmotility.

## 2022-05-19 DIAGNOSIS — H5702 Anisocoria: Secondary | ICD-10-CM | POA: Diagnosis not present

## 2022-05-19 LAB — HM DIABETES EYE EXAM

## 2022-05-20 ENCOUNTER — Ambulatory Visit
Admission: RE | Admit: 2022-05-20 | Discharge: 2022-05-20 | Disposition: A | Discharge status: Home / Self Care | Payer: Medicaid Other | Source: Ambulatory Visit | Attending: Specialist | Admitting: Specialist

## 2022-05-20 DIAGNOSIS — D219 Benign neoplasm of connective and other soft tissue, unspecified: Secondary | ICD-10-CM

## 2022-05-20 DIAGNOSIS — Z981 Arthrodesis status: Secondary | ICD-10-CM

## 2022-05-23 ENCOUNTER — Other Ambulatory Visit: Payer: Self-pay | Admitting: Family Medicine

## 2022-05-23 ENCOUNTER — Encounter: Payer: Self-pay | Admitting: Family Medicine

## 2022-05-23 DIAGNOSIS — S025XXA Fracture of tooth (traumatic), initial encounter for closed fracture: Secondary | ICD-10-CM

## 2022-05-23 NOTE — Telephone Encounter (Signed)
Requested medication (s) are due for refill today - provider review   Requested medication (s) are on the active medication list -yes  Future visit scheduled -yes  Last refill: 03/12/22 #[id number]  Notes to clinic: non delegated Rx  Requested Prescriptions  Pending Prescriptions Disp Refills   tiZANidine (ZANAFLEX) 4 MG tablet [Pharmacy Med Name: tiZANidine HCl 4 MG Oral Tablet] 60 tablet 0    Sig: TAKE 1 TABLET BY MOUTH EVERY 8 HOURS AS NEEDED FOR MUSCLE SPASMS.     Not Delegated - Cardiovascular:  Alpha-2 Agonists - tizanidine Failed - 05/23/2022 10:34 AM      Failed - This refill cannot be delegated      Passed - Valid encounter within last 6 months    Recent Outpatient Visits           1 month ago Type 1 diabetes mellitus with other specified complication [facility])   [place], Charlane Ferretti, MD   5 months ago Encounter for [employer] annual wellness exam   [place], [place], MD   6 months ago Hypertension associated with diabetes [facility])   [place], [place], MD   8 months ago Hyperlipidemia due to type 1 diabetes mellitus [facility])   [place], [place], MD   11 months ago Insulin dependent type 2 diabetes mellitus [facility])   [place], [place], MD       Future Appointments             In 2 weeks Lou[place], Daleen Bo, MD [place]   In 1 month [name], MD [place]               Requested Prescriptions  Pending Prescriptions Disp Refills   tiZANidine (ZANAFLEX) 4 MG tablet [Pharmacy Med Name: tiZANidine HCl 4 MG Oral Tablet] 60 tablet 0    Sig: TAKE 1 TABLET BY MOUTH EVERY 8 HOURS AS NEEDED FOR MUSCLE SPASMS.     Not Delegated - Cardiovascular:  Alpha-2 Agonists - tizanidine Failed - 05/23/2022 10:34 AM      Failed - This refill cannot be  delegated      Passed - Valid encounter within last 6 months    Recent Outpatient Visits           1 month ago Type 1 diabetes mellitus with other specified complication [facility])   [place], Charlane Ferretti, MD   5 months ago Encounter for [employer] annual wellness exam   [place], [place], MD   6 months ago Hypertension associated with diabetes [facility])   [place], Charlane Ferretti, MD   8 months ago Hyperlipidemia due to type 1 diabetes mellitus [facility])   [place], Charlane Ferretti, MD   11 months ago Insulin dependent type 2 diabetes mellitus [facility])   [place], [place], MD       Future Appointments             In 2 weeks Louanne Skye, Daleen Bo, MD [place]   In 1 month [name], MD [place]

## 2022-05-29 IMAGING — CR DG CHEST 2V
2 series · 2 of 2 positions shown · non-contrast
Comparison: None.

CLINICAL DATA: Chest pain, vomiting

EXAM:
CHEST - 2 VIEW

[chest lat]
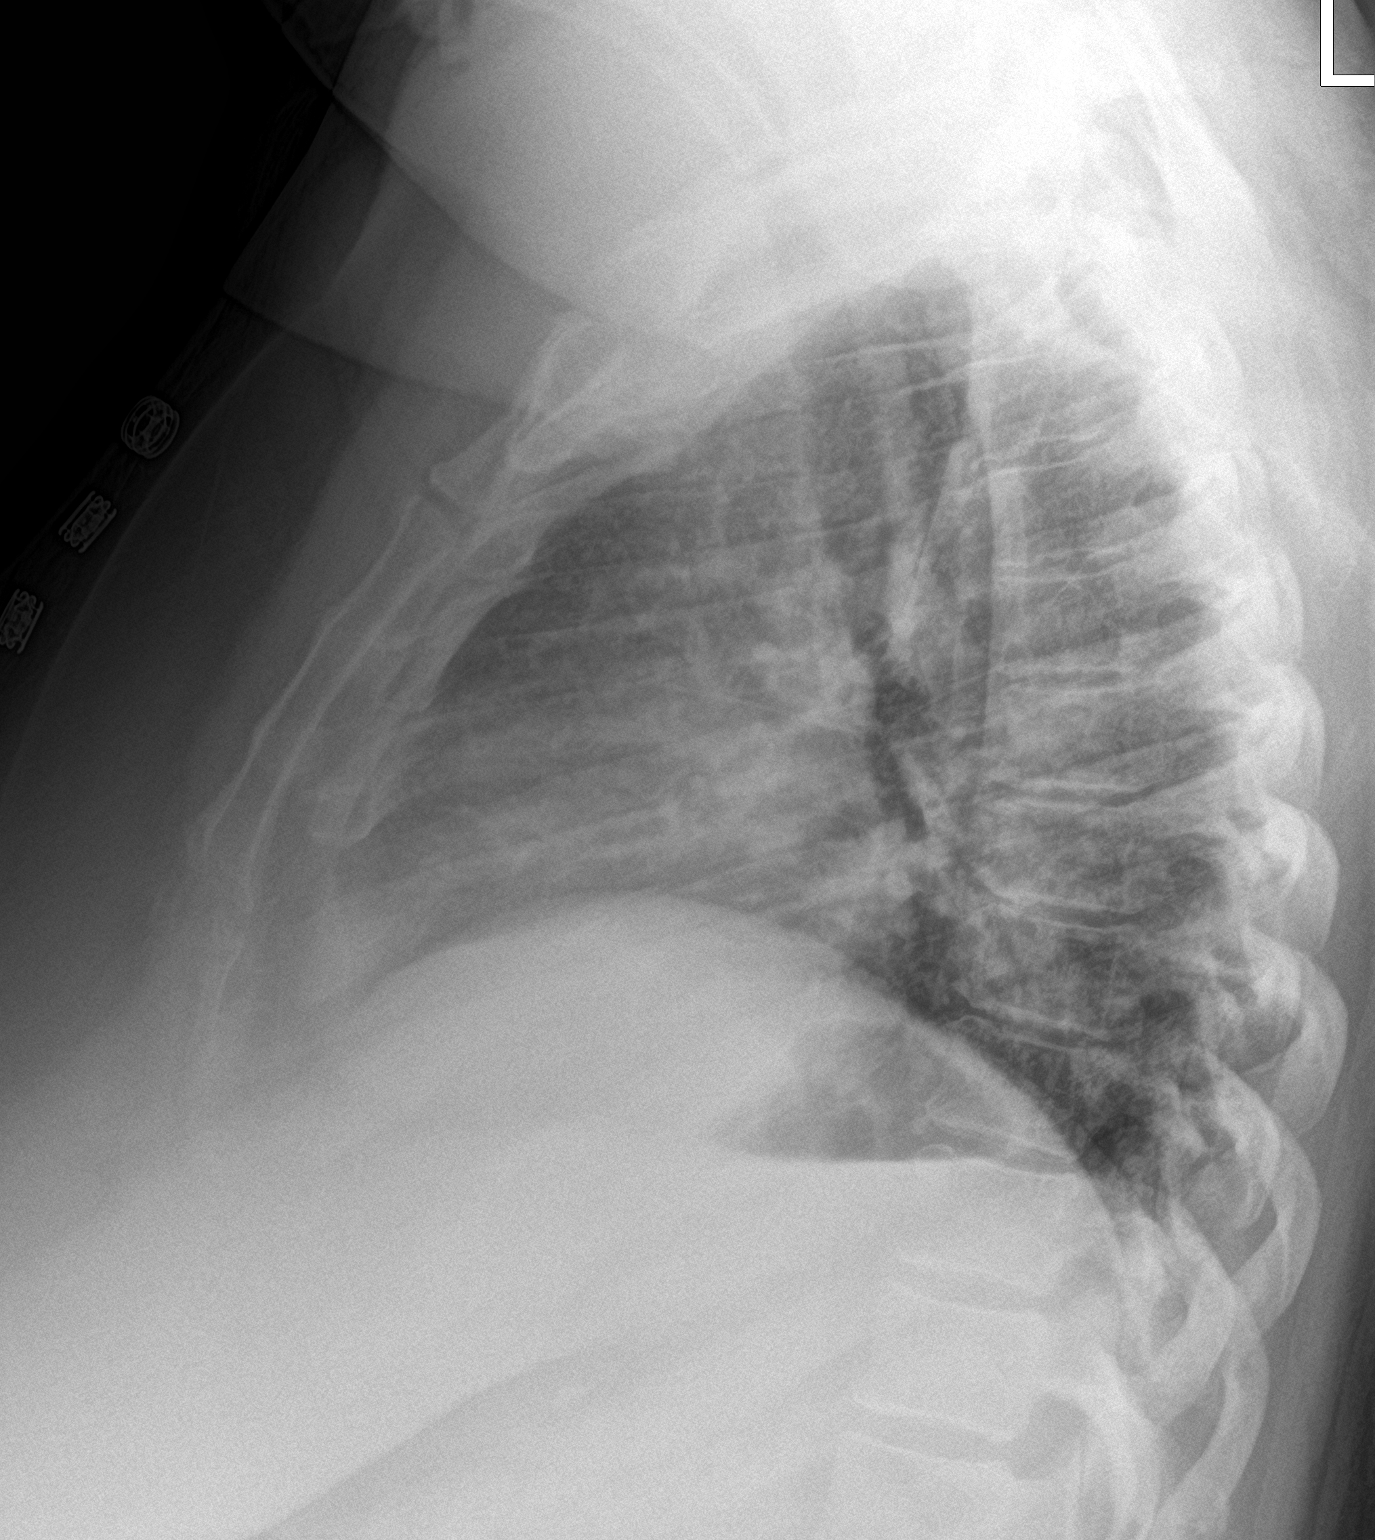

[chest ap]
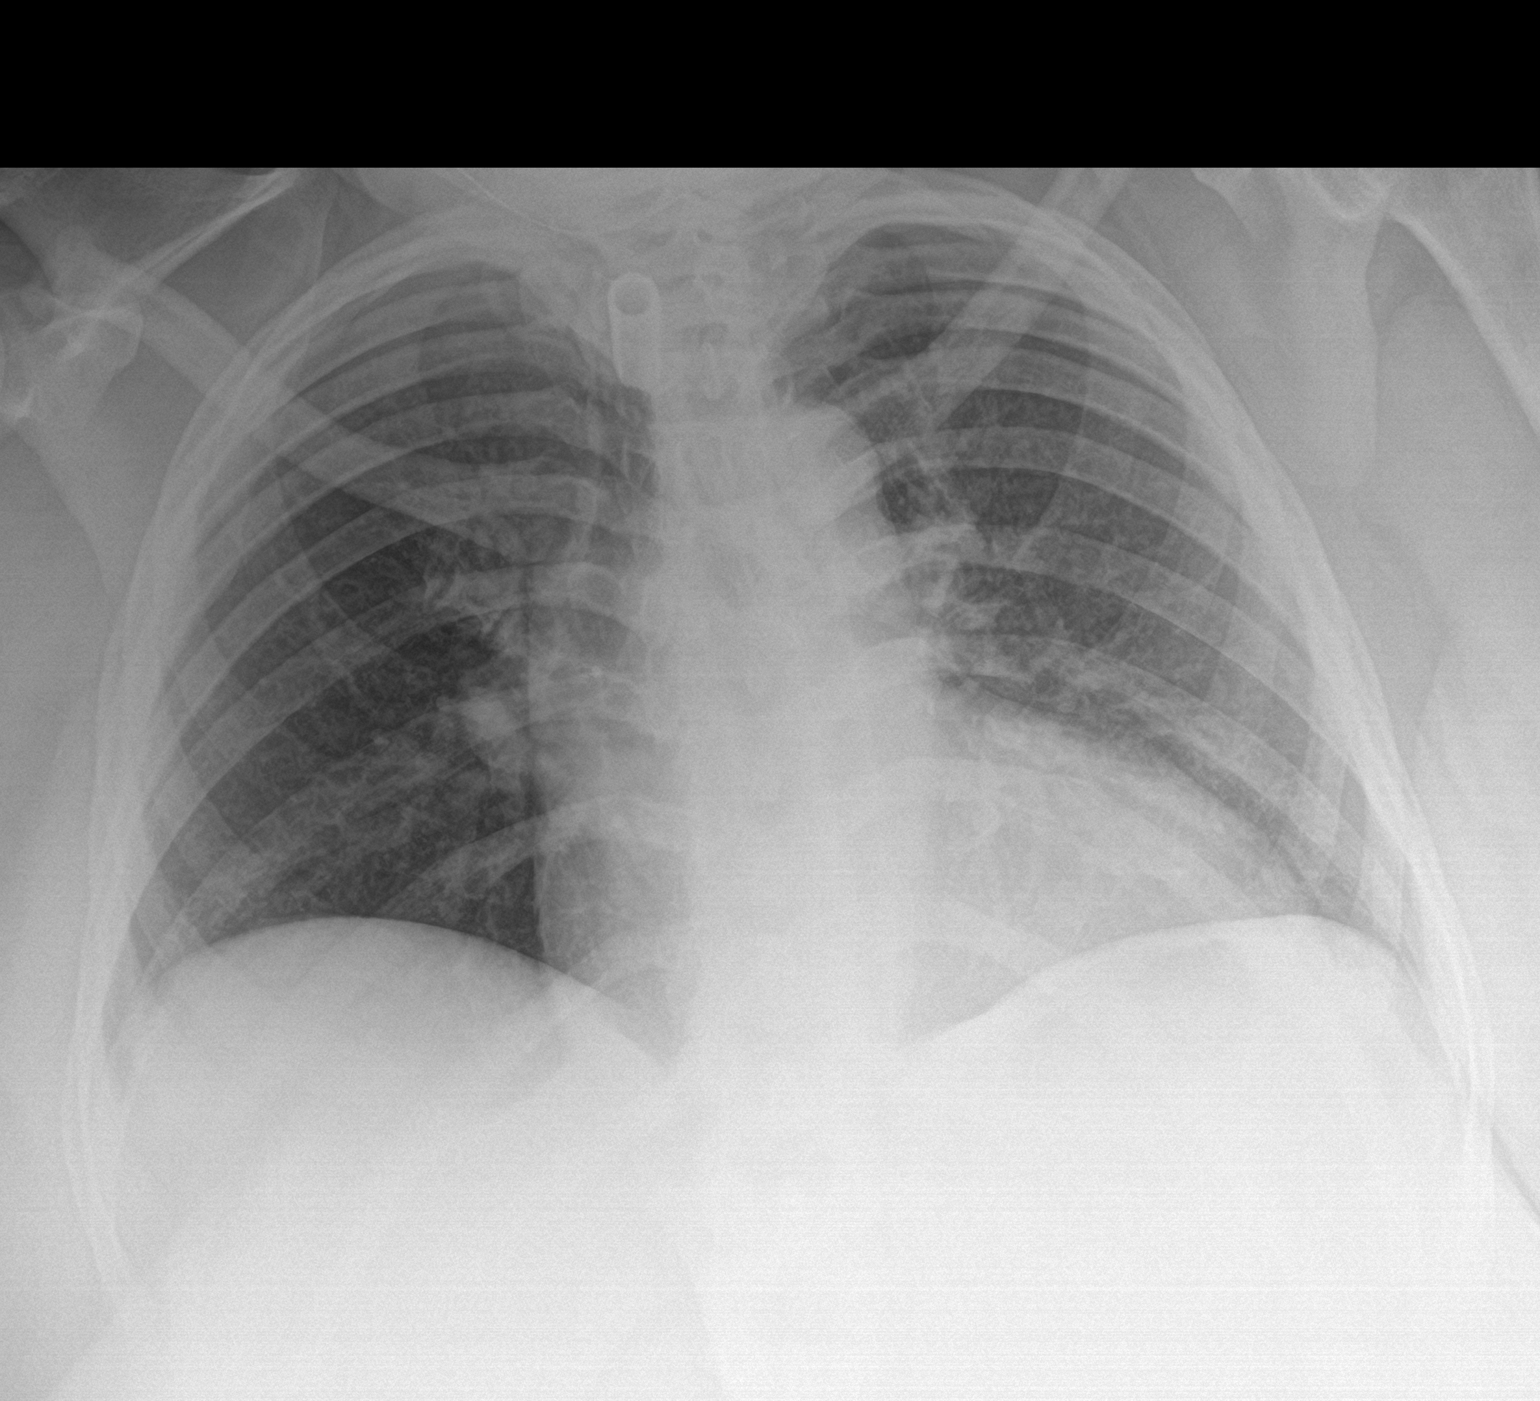

[2 of 2 positions shown; findings below may reference images not displayed]

FINDINGS: Lungs are clear. No pneumothorax or pleural effusion. Cardiac size
within normal limits. Tracheostomy in expected position. Pulmonary
vascularity is normal.
IMPRESSION: No active cardiopulmonary disease.

## 2022-06-05 ENCOUNTER — Encounter: Payer: Self-pay | Admitting: Family Medicine

## 2022-06-05 DIAGNOSIS — E119 Type 2 diabetes mellitus without complications: Secondary | ICD-10-CM | POA: Diagnosis not present

## 2022-06-05 DIAGNOSIS — M5416 Radiculopathy, lumbar region: Secondary | ICD-10-CM | POA: Diagnosis not present

## 2022-06-05 DIAGNOSIS — Z930 Tracheostomy status: Secondary | ICD-10-CM | POA: Diagnosis not present

## 2022-06-05 DIAGNOSIS — R030 Elevated blood-pressure reading, without diagnosis of hypertension: Secondary | ICD-10-CM | POA: Diagnosis not present

## 2022-06-05 DIAGNOSIS — G8929 Other chronic pain: Secondary | ICD-10-CM | POA: Diagnosis not present

## 2022-06-05 DIAGNOSIS — Z79899 Other long term (current) drug therapy: Secondary | ICD-10-CM | POA: Diagnosis not present

## 2022-06-05 DIAGNOSIS — M545 Low back pain, unspecified: Secondary | ICD-10-CM | POA: Diagnosis not present

## 2022-06-05 DIAGNOSIS — I10 Essential (primary) hypertension: Secondary | ICD-10-CM | POA: Diagnosis not present

## 2022-06-06 ENCOUNTER — Ambulatory Visit: Payer: Medicaid Other | Admitting: Specialist

## 2022-06-06 DIAGNOSIS — G4733 Obstructive sleep apnea (adult) (pediatric): Secondary | ICD-10-CM | POA: Diagnosis not present

## 2022-06-06 DIAGNOSIS — J9610 Chronic respiratory failure, unspecified whether with hypoxia or hypercapnia: Secondary | ICD-10-CM | POA: Diagnosis not present

## 2022-06-06 DIAGNOSIS — J449 Chronic obstructive pulmonary disease, unspecified: Secondary | ICD-10-CM | POA: Diagnosis not present

## 2022-06-06 DIAGNOSIS — J45909 Unspecified asthma, uncomplicated: Secondary | ICD-10-CM | POA: Diagnosis not present

## 2022-06-14 ENCOUNTER — Other Ambulatory Visit: Payer: Self-pay | Admitting: Family Medicine

## 2022-06-16 ENCOUNTER — Ambulatory Visit (INDEPENDENT_AMBULATORY_CARE_PROVIDER_SITE_OTHER): Payer: Medicare Other | Admitting: Specialist

## 2022-06-16 ENCOUNTER — Encounter: Payer: Self-pay | Admitting: Specialist

## 2022-06-16 VITALS — BP 160/96 | HR 116 | Ht 61.0 in | Wt 250.0 lb

## 2022-06-16 DIAGNOSIS — R531 Weakness: Secondary | ICD-10-CM

## 2022-06-16 DIAGNOSIS — M961 Postlaminectomy syndrome, not elsewhere classified: Secondary | ICD-10-CM

## 2022-06-16 DIAGNOSIS — M7061 Trochanteric bursitis, right hip: Secondary | ICD-10-CM | POA: Diagnosis not present

## 2022-06-16 DIAGNOSIS — M4316 Spondylolisthesis, lumbar region: Secondary | ICD-10-CM

## 2022-06-16 DIAGNOSIS — M5136 Other intervertebral disc degeneration, lumbar region: Secondary | ICD-10-CM | POA: Diagnosis not present

## 2022-06-16 DIAGNOSIS — M51369 Other intervertebral disc degeneration, lumbar region without mention of lumbar back pain or lower extremity pain: Secondary | ICD-10-CM

## 2022-06-16 DIAGNOSIS — E559 Vitamin D deficiency, unspecified: Secondary | ICD-10-CM | POA: Diagnosis not present

## 2022-06-16 DIAGNOSIS — M5410 Radiculopathy, site unspecified: Secondary | ICD-10-CM | POA: Diagnosis not present

## 2022-06-16 DIAGNOSIS — M48062 Spinal stenosis, lumbar region with neurogenic claudication: Secondary | ICD-10-CM | POA: Diagnosis not present

## 2022-06-16 DIAGNOSIS — Z981 Arthrodesis status: Secondary | ICD-10-CM

## 2022-06-16 DIAGNOSIS — M5416 Radiculopathy, lumbar region: Secondary | ICD-10-CM | POA: Diagnosis not present

## 2022-06-16 DIAGNOSIS — D219 Benign neoplasm of connective and other soft tissue, unspecified: Secondary | ICD-10-CM

## 2022-06-16 MED ORDER — PREGABALIN 50 MG PO CAPS: 50.0000 mg | ORAL_CAPSULE | Freq: Three times a day (TID) | ORAL | 0 refills | Status: DC

## 2022-06-16 NOTE — Patient Instructions (Signed)
Avoid frequent bending and stooping  No lifting greater than 10 lbs. May use ice or moist heat for pain. Weight loss is of benefit. Recommend stopping use of gabapentin and we with try pregablin 50 mg po TID.  Obtain lab to assess for a localized area of inflamation or infection with Sed rate and CRP.  Check Vitamin D level as delayed healing may cause pain and this can be due to low VItamin D Level.  Exercise is important to improve your indurance and does allow people to function better inspite of back pain.

## 2022-06-16 NOTE — Progress Notes (Signed)
Office Visit Note   Patient: [name]           Date of Birth: [birth year]           MRN: [id number] Visit Date: 06/16/2022              Requested by: [name], MD [place],  [place] [address] PCP: [name], MD   Assessment & Plan: Visit Diagnoses:  1. Post laminectomy syndrome   2. S/P lumbar fusion   3. Spondylolisthesis of lumbar region   4. Fibroma   5. Lumbar radiculopathy   6. Radicular pain of right lower extremity   7. Weakness   8. Degenerative disc disease, lumbar   9. Greater trochanteric bursitis, right   10. Spinal stenosis of lumbar region with neurogenic claudication     Plan: Follow-Up Instructions: No follow-ups on file.  Avoid frequent bending and stooping  No lifting greater than 10 lbs. May use ice or moist heat for pain. Weight loss is of benefit. Recommend stopping use of gabapentin and we with try pregablin 50 mg po TID.  Obtain lab to assess for a localized area of inflamation or infection with Sed rate and CRP.  Check Vitamin D level as delayed healing may cause pain and this can be due to low VItamin D Level.  Exercise is important to improve your indurance and does allow people to function better inspite of back pain. Orders:  No orders of the defined types were placed in this encounter.  No orders of the defined types were placed in this encounter.     Procedures: No procedures performed   Clinical Data: Findings:  CLINICAL DATA: Swelling and lump at lumbar incision. Possible fluid collection versus fibroma.  EXAM: US PELVIS LIMITED  TECHNIQUE: Grayscale and color flow imaging was performed in the region of concern in the low mid back.  COMPARISON: 10/12/2021.  FINDINGS: Evaluation is limited due to patient tenderness on exam per sonographer. In the region of palpable abnormality, there is an ill-defined hyperechoic region with a central hypoechoic region  with shadowing. No abnormal vascularity. No discrete mass is identified.  IMPRESSION: Ill-defined hyperechoic region with hypoechogenicity centrally and shadowing. No abnormal vascularity or discrete mass is identified. Differential diagnosis includes scar tissue or edema. Correlate clinically to exclude infection.   Electronically Signed By: Brett Fairy M.D. On: 05/21/2022 02:20    Subjective: Chief Complaint  Patient presents with  . Lower Back - Follow-up    51 year old female with history of L4-5 fusion for spondylosisthesis with recurring pain at the upper end of her inciscion. Also with pain into the left upper buttock and right upper buttock. No bowel or bladder difficulty.    Review of Systems   Objective: Vital Signs: BP (!) 160/96   Pulse (!) 116   Ht '5\' 1"'$  (1.549 m)   Wt 250 lb (113.4 kg)   LMP  (LMP Unknown)   BMI 47.24 kg/m   Physical Exam  Ortho Exam  Specialty Comments:  MRI LUMBAR SPINE WITHOUT AND WITH CONTRAST   TECHNIQUE: Multiplanar and multiecho pulse sequences of the lumbar spine were obtained without and with intravenous contrast.   CONTRAST:  63m MULTIHANCE GADOBENATE DIMEGLUMINE 529 MG/ML IV SOLN   COMPARISON:  Lumbar radiographs 01/02/2022, lumbar spine MRI 12/09/2020   FINDINGS: Segmentation: Standard; the lowest formed disc space is designated L5-S1.   Alignment: There is straightening of the normal lumbar lordosis. There is  no antero or retrolisthesis.   Vertebrae: There has been interval posterior instrumented fusion at L4-L5 with suspected right laminectomy. There is no evidence of complication. Marrow signal is otherwise normal. There is no suspicious marrow signal abnormality or marrow edema. There is no abnormal marrow enhancement.   Conus medullaris and cauda equina: Conus extends to the L1 level. Conus and cauda equina appear normal. There is no abnormal enhancement of the cauda equina nerve roots.   Paraspinal  and other soft tissues: Postsurgical changes are noted in the soft tissues posterior to the surgical levels. The paraspinal soft tissues are otherwise unremarkable.   Disc levels:   The disc heights at the nonsurgical levels are preserved.   T12-L1: No significant spinal canal or neural foraminal stenosis   L1-L2: No significant spinal canal or neural foraminal stenosis   L2-L3: No significant spinal canal or neural foraminal stenosis   L3-L4: There is a minimal disc bulge without significant spinal canal or neural foraminal stenosis   L4-L5: Status post posterior instrumented fusion and suspected right laminectomy. The previously seen disc protrusion has resolved following surgery. There is residual mild narrowing of the left subarticular zone without evidence of frank nerve root impingement. There is no residual spinal canal or neural foraminal stenosis. There is no convincing evidence of nerve root impingement or significant neural foraminal stenosis.   L5-S1: No significant spinal canal or neural foraminal stenosis.   IMPRESSION: 1. Status post posterior instrumented fusion and suspected right laminectomy at L4-L5. The previously seen disc protrusion on the study from 12/19/2020 is no longer present, and previously seen spinal canal stenosis has significantly improved. There is residual mild narrowing of the left subarticular zone without evidence of frank nerve root impingement. No other convincing evidence of nerve root impingement to explain the patient's right-sided symptoms. 2. No significant spinal canal or neural foraminal stenosis at the other levels.     Electronically Signed   By: Valetta Mole M.D.   On: 01/16/2022 13:29  Imaging: No results found.   PMFS History: Patient Active Problem List   Diagnosis Date Noted  . Spondylolisthesis, lumbar region 08/20/2021    Priority: High    Class: Chronic  . Abnormal UGI series   . Coffee ground emesis  10/15/2021  . Gastritis and gastroduodenitis   . Coffee ground vomiting 10/14/2021  . Fusion of spine of lumbar region 08/20/2021  . Chronic respiratory failure with hypoxia ([place]) 06/20/2021  . Preop pulmonary/respiratory exam 06/20/2021  . Pain of upper abdomen 11/28/2020  . Recurrent vomiting 11/28/2020  . COVID-19 09/16/2020  . Dyspnea 08/26/2020  . Rotator cuff tear 07/28/2019  . Insulin dependent type 2 diabetes mellitus ([place]) 04/12/2019  . Lactic acidosis 03/22/2019  . Chronic right shoulder pain   . Community acquired pneumonia 10/25/2018  . Asthma, chronic, unspecified asthma severity, with acute exacerbation 10/25/2018  . Acute respiratory failure with hypoxia and hypercapnia ([place]) 10/23/2018  . Acute pain of right shoulder due to trauma 10/05/2018  . Chronic bilateral low back pain without sciatica 10/05/2018  . Dysphonia 10/05/2018  . Severe asthma with exacerbation 10/05/2018  . Rotator cuff arthropathy, right 06/15/2018  . AKI (acute kidney injury) ([place]) 03/06/2018  . Carpal tunnel syndrome 01/18/2018  . Heart failure ([place]) 05/20/2017  . Normocytic anemia 05/06/2017  . Tracheostomy dependent ([place]) 03/26/2017  . Dysphagia 03/26/2017  . Klebsiella cystitis 03/14/2017  . Morbid obesity with BMI of 40.0-44.9, adult ([place]) 03/13/2017  . Subglottic stenosis 03/06/2017  . Asthma  03/06/2017  . Chronic diastolic heart failure ([place]) 03/06/2017  . Tracheal stenosis 03/04/2017  . Acute blood loss anemia   . Generalized anxiety disorder   . Chronic pain syndrome   . GERD (gastroesophageal reflux disease) 02/22/2016  . Depression 02/22/2016  . Migraine 01/30/2015  . Bulging lumbar disc 09/05/2013  . Essential hypertension 09/05/2013  . Allergic rhinitis, seasonal 08/11/2012  . Chronic cough 08/11/2012  . OSA (obstructive sleep apnea) 12/04/2011   Past Medical History:  Diagnosis Date  . Allergy   . Anxiety   . Arthritis   . Asthma   . Chest pain of uncertain etiology  4/65/6812  . Chronic back pain   . Chronic chest pain    resolved, no problems since 2019 per patient 07/27/19  . COPD (chronic obstructive pulmonary disease) ([place])   . Depression   . DM (diabetes mellitus) ([place])    INSULIN DEPENDENT - TYPE 1  . Dyspnea   . GERD (gastroesophageal reflux disease)   . Headache(784.0)   . History of hiatal hernia   . Hyperlipidemia    no med, diet controlled  . Hypertension   . Hypokalemia   . Pneumonia   . Respiratory disease 05/2017   uses inhalers, neb tx prn, no oxygen  . Sleep apnea    does not use CPAP due to trach  . Status post tracheostomy ([place]) 03/14/2017   Last Assessment & Plan:  Formatting of this note might be different from the original. - [place] - On tach - ENT following along (please see above for current trach management plan)  . Tracheostomy in place [facility]) 02/2017    Family History  Problem Relation Age of Onset  . Heart attack Mother   . Stroke Mother   . Diabetes Mother   . Hypertension Mother   . Arthritis Mother   . Stroke Father   . Asthma Sister   . Hypertension Sister   . Asthma Sister   . Diabetes Sister   . Asthma Brother   . Seizures Brother   . Asthma Brother   . Diabetes Brother   . Asthma Daughter   . Asthma Son   . Asthma Grandson   . Colon cancer Neg Hx   . Rectal cancer Neg Hx   . Stomach cancer Neg Hx   . Esophageal cancer Neg Hx   . Ovarian cancer Neg Hx   . Pancreatic cancer Neg Hx     Past Surgical History:  Procedure Laterality Date  . ABDOMINAL HYSTERECTOMY  2009  . APPENDECTOMY    . BIOPSY  10/15/2021   Procedure: BIOPSY;  Surgeon: Thornton Park, MD;  Location: [place];  Service: Gastroenterology;;  . CESAREAN SECTION     x 3  . CHOLECYSTECTOMY N/A 03/02/2014   Procedure: LAPAROSCOPIC CHOLECYSTECTOMY;  Surgeon: Joyice Faster. Cornett, MD;  Location: [place];  Service: General;  Laterality: N/A;  . ESOPHAGOGASTRODUODENOSCOPY (EGD) WITH PROPOFOL N/A  10/15/2021   Procedure: ESOPHAGOGASTRODUODENOSCOPY (EGD) WITH PROPOFOL;  Surgeon: Thornton Park, MD;  Location: [place];  Service: Gastroenterology;  Laterality: N/A;  . HERNIA REPAIR    . PANENDOSCOPY N/A 03/04/2017   Procedure: PANENDOSCOPY WITH POSSIBLE FOREIGN BODY REMOVAL;  Surgeon: Jodi Marble, MD;  Location: [facility] [place];  Service: ENT;  Laterality: N/A;  . ROTATOR CUFF REPAIR Left   . ROTATOR CUFF REPAIR Right 07/27/2019  . SHOULDER ARTHROSCOPY WITH SUBACROMIAL DECOMPRESSION, ROTATOR CUFF REPAIR AND BICEP TENDON REPAIR Right 07/28/2019   Procedure:  RIGHT SHOULDER ARTHROSCOP, MINI OPEN ROTATOR CUFF TEAR REPAIR,  BICEPS TENODESIS, DISTAL CLAVICLE EXCISION;  Surgeon: Meredith Pel, MD;  Location: [place];  Service: Orthopedics;  Laterality: Right;  . TOOTH EXTRACTION N/A 02/22/2021   Procedure: DENTAL RESTORATION/EXTRACTIONS;  Surgeon: Diona Browner, DMD;  Location: [place];  Service: Oral Surgery;  Laterality: N/A;  . TRACHEOSTOMY  02/2017  . TUBAL LIGATION    . VESICOVAGINAL FISTULA CLOSURE W/ TAH  2009   Social History   Occupational History  . Not on file  Tobacco Use  . Smoking status: Former    Packs/day: 0.50    Years: 25.00    Total pack years: 12.50    Types: Cigarettes    Quit date: 01/21/2016    Years since quitting: 6.4  . Smokeless tobacco: Never  Vaping Use  . Vaping Use: Never used  Substance and Sexual Activity  . Alcohol use: Yes    Comment: social wine  . Drug use: Not Currently    Types: Marijuana    Comment: couple times a month  . Sexual activity: Yes    Partners: Male    Birth control/protection: Surgical    Comment: Hysterectomy

## 2022-06-18 LAB — SEDIMENTATION RATE: Sed Rate: 38 mm/h — ABNORMAL HIGH (ref 0–30)

## 2022-06-18 LAB — C-REACTIVE PROTEIN: CRP: 34.9 mg/L — ABNORMAL HIGH (ref <8.0)

## 2022-06-18 LAB — VITAMIN D 25 HYDROXY (VIT D DEFICIENCY, FRACTURES): Vit D, 25-Hydroxy: 28 ng/mL — ABNORMAL LOW (ref 30–100)

## 2022-06-19 ENCOUNTER — Other Ambulatory Visit: Payer: Self-pay | Admitting: Endocrinology

## 2022-06-19 ENCOUNTER — Encounter: Payer: Self-pay | Admitting: Family Medicine

## 2022-06-19 ENCOUNTER — Other Ambulatory Visit: Payer: Self-pay | Admitting: Family Medicine

## 2022-06-19 DIAGNOSIS — E109 Type 1 diabetes mellitus without complications: Secondary | ICD-10-CM

## 2022-06-20 MED ORDER — MISC. DEVICES MISC
0 refills | Status: DC
Start: 2022-06-20 — End: 2022-08-18

## 2022-06-20 MED ORDER — ONETOUCH VERIO VI STRP: ORAL_STRIP | 2 refills | Status: DC

## 2022-06-20 MED ORDER — ONETOUCH VERIO W/DEVICE KIT: 1.0000 | PACK | Freq: Every day | 0 refills | Status: DC

## 2022-06-20 MED ORDER — MELATONIN 3 MG PO TABS: 3.0000 mg | ORAL_TABLET | Freq: Every day | ORAL | 3 refills | Status: DC

## 2022-06-20 MED ORDER — MISC. DEVICES MISC: 0 refills | Status: DC

## 2022-07-06 DIAGNOSIS — G4733 Obstructive sleep apnea (adult) (pediatric): Secondary | ICD-10-CM | POA: Diagnosis not present

## 2022-07-06 DIAGNOSIS — J45909 Unspecified asthma, uncomplicated: Secondary | ICD-10-CM | POA: Diagnosis not present

## 2022-07-06 DIAGNOSIS — J9610 Chronic respiratory failure, unspecified whether with hypoxia or hypercapnia: Secondary | ICD-10-CM | POA: Diagnosis not present

## 2022-07-06 DIAGNOSIS — J449 Chronic obstructive pulmonary disease, unspecified: Secondary | ICD-10-CM | POA: Diagnosis not present

## 2022-07-07 ENCOUNTER — Encounter: Payer: Self-pay | Admitting: Specialist

## 2022-07-07 ENCOUNTER — Other Ambulatory Visit: Payer: Self-pay | Admitting: Family Medicine

## 2022-07-07 ENCOUNTER — Ambulatory Visit (INDEPENDENT_AMBULATORY_CARE_PROVIDER_SITE_OTHER): Payer: Medicare Other | Admitting: Specialist

## 2022-07-07 VITALS — BP 169/105 | HR 115 | Ht 61.0 in | Wt 250.0 lb

## 2022-07-07 DIAGNOSIS — M4316 Spondylolisthesis, lumbar region: Secondary | ICD-10-CM

## 2022-07-07 DIAGNOSIS — R112 Nausea with vomiting, unspecified: Secondary | ICD-10-CM

## 2022-07-07 DIAGNOSIS — M5416 Radiculopathy, lumbar region: Secondary | ICD-10-CM | POA: Diagnosis not present

## 2022-07-07 DIAGNOSIS — R700 Elevated erythrocyte sedimentation rate: Secondary | ICD-10-CM

## 2022-07-07 DIAGNOSIS — M5136 Other intervertebral disc degeneration, lumbar region: Secondary | ICD-10-CM | POA: Diagnosis not present

## 2022-07-07 DIAGNOSIS — Z981 Arthrodesis status: Secondary | ICD-10-CM

## 2022-07-07 MED ORDER — VITAMIN D (ERGOCALCIFEROL) 1.25 MG (50000 UNIT) PO CAPS: 50000.0000 [IU] | ORAL_CAPSULE | ORAL | 0 refills | Status: DC

## 2022-07-07 MED ORDER — ONDANSETRON HCL 4 MG PO TABS: 4.0000 mg | ORAL_TABLET | Freq: Three times a day (TID) | ORAL | 0 refills | Status: DC | PRN

## 2022-07-07 MED ORDER — TIZANIDINE HCL 4 MG PO TABS
4.0000 mg | ORAL_TABLET | Freq: Three times a day (TID) | ORAL | 2 refills | Status: DC | PRN
Start: 2022-07-07 — End: 2022-09-12

## 2022-07-07 NOTE — Patient Instructions (Signed)
  Plan: Follow-Up Instructions: No follow-ups on file.  Avoid frequent bending and stooping  No lifting greater than 10 lbs. May use ice or moist heat for pain. Weight loss is of benefit. Recommend stopping use of gabapentin and we with try pregablin 50 mg po TID.  MRI of the lumbar spine to assess for a localized area of inflamation or infection with elevation of Sed rate and CRP.  Check Vitamin D level as delayed healing may cause pain and this can be due to low VItamin D Level.  Exercise is important to improve your indurance and does allow people to function better inspite of back pain. Orders:  No orders

## 2022-07-07 NOTE — Progress Notes (Signed)
Office Visit Note   Patient: [name]           Date of Birth: 1970-10-18           MRN: [id number] Visit Date: 07/07/2022              Requested by: [name], MD [address] [place],  [place] [address] PCP: [name], MD   Assessment & Plan: Visit Diagnoses:  1. Spondylolisthesis of lumbar region   2. S/P lumbar fusion   3. Lumbar radiculopathy   4. Degenerative disc disease, lumbar         Plan: Follow-Up Instructions: No follow-ups on file.  Avoid frequent bending and stooping  No lifting greater than 10 lbs. May use ice or moist heat for pain. Weight loss is of benefit. Recommend stopping use of gabapentin and we with try pregablin 50 mg po TID.  Obtain MRI to assess for a localized area of inflamation or infection with Sed rate and CRP.  Check Vitamin D level as delayed healing may cause pain and this can be due to low VItamin D Level.  Exercise is important to improve your indurance and does allow people to function better inspite of back pain. Orders:  No orders Orders:  No orders  Follow-Up Instructions: No follow-ups on file.   Orders:  Orders Placed This Encounter  Procedures   MR Lumbar Spine w/o contrast   Meds ordered this encounter  Medications   Vitamin D, Ergocalciferol, (DRISDOL) 1.25 MG (50000 UNIT) CAPS capsule    Sig: Take 1 capsule (50,000 Units total) by mouth every 7 (seven) days.    Dispense:  5 capsule    Refill:  0      Procedures: No procedures performed   Clinical Data: No additional findings.   Subjective: Chief Complaint  Patient presents with   Lower Back - Pain    51 year old female nearly 11 months post op L4-5 fusion for spondylolisthesis. She is having increasing back pain with pain into both legs and buttocks. No bowel or bladder difficulty. :Hs had testinng showing lew Vita D levels. Uning a ostoemy for breathing since 2018. Bot CRP and sed rate are mildly elevated.    Review of Systems  Constitutional: Negative.   HENT: Negative.    Eyes: Negative.   Respiratory: Negative.    Cardiovascular: Negative.   Gastrointestinal: Negative.   Endocrine: Negative.   Genitourinary: Negative.   Musculoskeletal: Negative.   Skin: Negative.   Allergic/Immunologic: Negative.   Neurological: Negative.   Hematological: Negative.   Psychiatric/Behavioral: Negative.      Objective: Vital Signs: BP (!) 169/105   Pulse (!) 115   Ht '5\' 1"'$  (1.549 m)   Wt 250 lb (113.4 kg)   LMP  (LMP Unknown)   BMI 47.24 kg/m   Physical Exam Constitutional:      Appearance: She is well-developed.  HENT:     Head: Normocephalic and atraumatic.  Eyes:     Pupils: Pupils are equal, round, and reactive to light.  Pulmonary:     Effort: Pulmonary effort is normal.     Breath sounds: Normal breath sounds.  Abdominal:     General: Bowel sounds are normal.     Palpations: Abdomen is soft.  Musculoskeletal:     Cervical back: Normal range of motion and neck supple.     Lumbar back: Negative right straight leg raise test and negative left straight leg raise test.  Skin:    General: Skin is warm and dry.  Neurological:     Mental Status: She is alert and oriented to person, place, and time.  Psychiatric:        Behavior: Behavior normal.        Thought Content: Thought content normal.        Judgment: Judgment normal.   Back Exam   Tenderness  The patient is experiencing tenderness in the lumbar.  Range of Motion  Extension:  abnormal  Flexion:  abnormal  Lateral bend right:  abnormal  Lateral bend left:  abnormal  Rotation right:  abnormal  Rotation left:  abnormal   Muscle Strength  Right Quadriceps:  5/5  Left Quadriceps:  5/5  Right Hamstrings:  5/5  Left Hamstrings:  5/5   Tests  Straight leg raise right: negative Straight leg raise left: negative  Other  Toe walk: normal Heel walk: normal Gait: normal   Comments:  There is an area of  nadularity at the superior most part of the incision, this may be the spinous processess for where soft tissue to  Pulled together. No warm or fluctuance.     Specialty Comments:  MRI LUMBAR SPINE WITHOUT AND WITH CONTRAST   TECHNIQUE: Multiplanar and multiecho pulse sequences of the lumbar spine were obtained without and with intravenous contrast.   CONTRAST:  55m MULTIHANCE GADOBENATE DIMEGLUMINE 529 MG/ML IV SOLN   COMPARISON:  Lumbar radiographs 01/02/2022, lumbar spine MRI 12/09/2020   FINDINGS: Segmentation: Standard; the lowest formed disc space is designated L5-S1.   Alignment: There is straightening of the normal lumbar lordosis. There is no antero or retrolisthesis.   Vertebrae: There has been interval posterior instrumented fusion at L4-L5 with suspected right laminectomy. There is no evidence of complication. Marrow signal is otherwise normal. There is no suspicious marrow signal abnormality or marrow edema. There is no abnormal marrow enhancement.   Conus medullaris and cauda equina: Conus extends to the L1 level. Conus and cauda equina appear normal. There is no abnormal enhancement of the cauda equina nerve roots.   Paraspinal and other soft tissues: Postsurgical changes are noted in the soft tissues posterior to the surgical levels. The paraspinal soft tissues are otherwise unremarkable.   Disc levels:   The disc heights at the nonsurgical levels are preserved.   T12-L1: No significant spinal canal or neural foraminal stenosis   L1-L2: No significant spinal canal or neural foraminal stenosis   L2-L3: No significant spinal canal or neural foraminal stenosis   L3-L4: There is a minimal disc bulge without significant spinal canal or neural foraminal stenosis   L4-L5: Status post posterior instrumented fusion and suspected right laminectomy. The previously seen disc protrusion has resolved following surgery. There is residual mild narrowing of the  left subarticular zone without evidence of frank nerve root impingement. There is no residual spinal canal or neural foraminal stenosis. There is no convincing evidence of nerve root impingement or significant neural foraminal stenosis.   L5-S1: No significant spinal canal or neural foraminal stenosis.   IMPRESSION: 1. Status post posterior instrumented fusion and suspected right laminectomy at L4-L5. The previously seen disc protrusion on the study from 12/19/2020 is no longer present, and previously seen spinal canal stenosis has significantly improved. There is residual mild narrowing of the left subarticular zone without evidence of frank nerve root impingement. No other convincing evidence of nerve root impingement to explain the patient's right-sided symptoms. 2. No significant spinal canal or neural foraminal  stenosis at the other levels.     Electronically Signed   By: Valetta Mole M.D.   On: 01/16/2022 13:29  Imaging: No results found.   PMFS History: Patient Active Problem List   Diagnosis Date Noted   Spondylolisthesis, lumbar region 08/20/2021    Priority: High    Class: Chronic   Abnormal UGI series    Coffee ground emesis 10/15/2021   Gastritis and gastroduodenitis    Coffee ground vomiting 10/14/2021   Fusion of spine of lumbar region 08/20/2021   Chronic respiratory failure with hypoxia ([place]) 06/20/2021   Preop pulmonary/respiratory exam 06/20/2021   Pain of upper abdomen 11/28/2020   Recurrent vomiting 11/28/2020   COVID-19 09/16/2020   Dyspnea 08/26/2020   Rotator cuff tear 07/28/2019   Insulin dependent type 2 diabetes mellitus ([place]) 04/12/2019   Lactic acidosis 03/22/2019   Chronic right shoulder pain    Community acquired pneumonia 10/25/2018   Asthma, chronic, unspecified asthma severity, with acute exacerbation 10/25/2018   Acute respiratory failure with hypoxia and hypercapnia ([place]) 10/23/2018   Acute pain of right shoulder due to trauma  10/05/2018   Chronic bilateral low back pain without sciatica 10/05/2018   Dysphonia 10/05/2018   Severe asthma with exacerbation 10/05/2018   Rotator cuff arthropathy, right 06/15/2018   AKI (acute kidney injury) ([place]) 03/06/2018   Carpal tunnel syndrome 01/18/2018   Heart failure ([place]) 05/20/2017   Normocytic anemia 05/06/2017   Tracheostomy dependent ([place]) 03/26/2017   Dysphagia 03/26/2017   Klebsiella cystitis 03/14/2017   Morbid obesity with BMI of 40.0-44.9, adult ([place]) 03/13/2017   Subglottic stenosis 03/06/2017   Asthma 03/06/2017   Chronic diastolic heart failure ([place]) 03/06/2017   Tracheal stenosis 03/04/2017   Acute blood loss anemia    Generalized anxiety disorder    Chronic pain syndrome    GERD (gastroesophageal reflux disease) 02/22/2016   Depression 02/22/2016   Migraine 01/30/2015   Bulging lumbar disc 09/05/2013   Essential hypertension 09/05/2013   Allergic rhinitis, seasonal 08/11/2012   Chronic cough 08/11/2012   OSA (obstructive sleep apnea) 12/04/2011   Past Medical History:  Diagnosis Date   Allergy    Anxiety    Arthritis    Asthma    Chest pain of uncertain etiology 6/75/9163   Chronic back pain    Chronic chest pain    resolved, no problems since 2019 per patient 07/27/19   COPD (chronic obstructive pulmonary disease) ([facility])    Depression    DM (diabetes mellitus) ([place])    INSULIN DEPENDENT - TYPE 1   Dyspnea    GERD (gastroesophageal reflux disease)    Headache(784.0)    History of hiatal hernia    Hyperlipidemia    no med, diet controlled   Hypertension    Hypokalemia    Pneumonia    Respiratory disease 05/2017   uses inhalers, neb tx prn, no oxygen   Sleep apnea    does not use CPAP due to trach   Status post tracheostomy ([place]) 03/14/2017   Last Assessment & Plan:  Formatting of this note might be different from the original. - [place] - On tach - ENT following along (please see above for current  trach management plan)   Tracheostomy in place [facility]) 02/2017    Family History  Problem Relation Age of Onset   Heart attack Mother    Stroke Mother    Diabetes Mother    Hypertension Mother    Arthritis Mother  Stroke Father    Asthma Sister    Hypertension Sister    Asthma Sister    Diabetes Sister    Asthma Brother    Seizures Brother    Asthma Brother    Diabetes Brother    Asthma Daughter    Asthma Son    Asthma Grandson    Colon cancer Neg Hx    Rectal cancer Neg Hx    Stomach cancer Neg Hx    Esophageal cancer Neg Hx    Ovarian cancer Neg Hx    Pancreatic cancer Neg Hx     Past Surgical History:  Procedure Laterality Date   ABDOMINAL HYSTERECTOMY  2009   APPENDECTOMY     BIOPSY  10/15/2021   Procedure: BIOPSY;  Surgeon: Thornton Park, MD;  Location: [place];  Service: Gastroenterology;;   CESAREAN SECTION     x 3   CHOLECYSTECTOMY N/A 03/02/2014   Procedure: LAPAROSCOPIC CHOLECYSTECTOMY;  Surgeon: Joyice Faster. Cornett, MD;  Location: [place];  Service: General;  Laterality: N/A;   ESOPHAGOGASTRODUODENOSCOPY (EGD) WITH PROPOFOL N/A 10/15/2021   Procedure: ESOPHAGOGASTRODUODENOSCOPY (EGD) WITH PROPOFOL;  Surgeon: Thornton Park, MD;  Location: [place];  Service: Gastroenterology;  Laterality: N/A;   HERNIA REPAIR     PANENDOSCOPY N/A 03/04/2017   Procedure: PANENDOSCOPY WITH POSSIBLE FOREIGN BODY REMOVAL;  Surgeon: Jodi Marble, MD;  Location: [place];  Service: ENT;  Laterality: N/A;   ROTATOR CUFF REPAIR Left    ROTATOR CUFF REPAIR Right 07/27/2019   SHOULDER ARTHROSCOPY WITH SUBACROMIAL DECOMPRESSION, ROTATOR CUFF REPAIR AND BICEP TENDON REPAIR Right 07/28/2019   Procedure: RIGHT SHOULDER ARTHROSCOP, MINI OPEN ROTATOR CUFF TEAR REPAIR,  BICEPS TENODESIS, DISTAL CLAVICLE EXCISION;  Surgeon: Meredith Pel, MD;  Location: [facility];  Service: Orthopedics;  Laterality: Right;   TOOTH EXTRACTION N/A 02/22/2021   Procedure: DENTAL RESTORATION/EXTRACTIONS;   Surgeon: Diona Browner, DMD;  [facility];  Service: Oral Surgery;  Laterality: N/A;   TRACHEOSTOMY  02/2017   TUBAL LIGATION     VESICOVAGINAL FISTULA CLOSURE W/ TAH  2009   Social History   Occupational History   Not on file  Tobacco Use   Smoking status: Former    Packs/day: 0.50    Years: 25.00    Total pack years: 12.50    Types: Cigarettes    Quit date: 01/21/2016    Years since quitting: 6.4   Smokeless tobacco: Never  Vaping Use   Vaping Use: Never used  Substance and Sexual Activity   Alcohol use: Yes    Comment: social wine   Drug use: Not Currently    Types: Marijuana    Comment: couple times a month   Sexual activity: Yes    Partners: Male    Birth control/protection: Surgical    Comment: Hysterectomy

## 2022-07-09 ENCOUNTER — Other Ambulatory Visit: Payer: Self-pay

## 2022-07-09 ENCOUNTER — Encounter: Payer: Self-pay | Admitting: Family Medicine

## 2022-07-09 ENCOUNTER — Telehealth: Payer: Self-pay | Admitting: Family Medicine

## 2022-07-09 ENCOUNTER — Ambulatory Visit: Payer: Medicare Other | Attending: Family Medicine | Admitting: Family Medicine

## 2022-07-09 VITALS — BP 143/95 | HR 115 | Temp 98.5°F | Wt 249.8 lb

## 2022-07-09 DIAGNOSIS — R600 Localized edema: Secondary | ICD-10-CM

## 2022-07-09 DIAGNOSIS — I152 Hypertension secondary to endocrine disorders: Secondary | ICD-10-CM

## 2022-07-09 DIAGNOSIS — J4540 Moderate persistent asthma, uncomplicated: Secondary | ICD-10-CM | POA: Diagnosis not present

## 2022-07-09 DIAGNOSIS — E785 Hyperlipidemia, unspecified: Secondary | ICD-10-CM | POA: Diagnosis not present

## 2022-07-09 DIAGNOSIS — Z23 Encounter for immunization: Secondary | ICD-10-CM

## 2022-07-09 DIAGNOSIS — M51369 Other intervertebral disc degeneration, lumbar region without mention of lumbar back pain or lower extremity pain: Secondary | ICD-10-CM

## 2022-07-09 DIAGNOSIS — Z794 Long term (current) use of insulin: Secondary | ICD-10-CM

## 2022-07-09 DIAGNOSIS — M940 Chondrocostal junction syndrome [Tietze]: Secondary | ICD-10-CM | POA: Diagnosis not present

## 2022-07-09 DIAGNOSIS — R053 Chronic cough: Secondary | ICD-10-CM

## 2022-07-09 DIAGNOSIS — G43119 Migraine with aura, intractable, without status migrainosus: Secondary | ICD-10-CM

## 2022-07-09 DIAGNOSIS — Z6841 Body Mass Index (BMI) 40.0 and over, adult: Secondary | ICD-10-CM

## 2022-07-09 DIAGNOSIS — M5136 Other intervertebral disc degeneration, lumbar region: Secondary | ICD-10-CM

## 2022-07-09 DIAGNOSIS — E1069 Type 1 diabetes mellitus with other specified complication: Secondary | ICD-10-CM | POA: Diagnosis not present

## 2022-07-09 DIAGNOSIS — E1159 Type 2 diabetes mellitus with other circulatory complications: Secondary | ICD-10-CM | POA: Diagnosis not present

## 2022-07-09 DIAGNOSIS — K219 Gastro-esophageal reflux disease without esophagitis: Secondary | ICD-10-CM | POA: Diagnosis not present

## 2022-07-09 LAB — GLUCOSE, POCT (MANUAL RESULT ENTRY): POC Glucose: 235 mg/dl — AB (ref 70–99)

## 2022-07-09 LAB — POCT GLYCOSYLATED HEMOGLOBIN (HGB A1C): HbA1c, POC (controlled diabetic range): 9.5 % — AB (ref 0.0–7.0)

## 2022-07-09 MED ORDER — AMITRIPTYLINE HCL 10 MG PO TABS: 10.0000 mg | ORAL_TABLET | Freq: Every day | ORAL | 1 refills | Status: DC

## 2022-07-09 MED ORDER — TRESIBA FLEXTOUCH 200 UNIT/ML ~~LOC~~ SOPN: 160.0000 [IU] | PEN_INJECTOR | Freq: Every day | SUBCUTANEOUS | 3 refills | Status: DC

## 2022-07-09 MED ORDER — DAPAGLIFLOZIN PROPANEDIOL 10 MG PO TABS: 10.0000 mg | ORAL_TABLET | Freq: Every day | ORAL | 1 refills | Status: DC

## 2022-07-09 MED ORDER — FREESTYLE LIBRE 2 READER DEVI: 0 refills | Status: DC

## 2022-07-09 MED ORDER — BENZONATATE 100 MG PO CAPS: 100.0000 mg | ORAL_CAPSULE | Freq: Two times a day (BID) | ORAL | 0 refills | Status: DC | PRN

## 2022-07-09 MED ORDER — DULERA 200-5 MCG/ACT IN AERO: 2.0000 | INHALATION_SPRAY | Freq: Two times a day (BID) | RESPIRATORY_TRACT | 6 refills | Status: DC

## 2022-07-09 MED ORDER — MONTELUKAST SODIUM 10 MG PO TABS: 10.0000 mg | ORAL_TABLET | Freq: Every day | ORAL | 1 refills | Status: DC

## 2022-07-09 MED ORDER — FUROSEMIDE 20 MG PO TABS: 20.0000 mg | ORAL_TABLET | Freq: Every day | ORAL | 1 refills | Status: DC

## 2022-07-09 MED ORDER — PANTOPRAZOLE SODIUM 40 MG PO TBEC: 40.0000 mg | DELAYED_RELEASE_TABLET | Freq: Two times a day (BID) | ORAL | 1 refills | Status: DC

## 2022-07-09 MED ORDER — FREESTYLE LIBRE 2 SENSOR MISC: 3 refills | Status: DC

## 2022-07-09 NOTE — Patient Instructions (Addendum)
Costochondritis  Costochondritis is irritation and swelling (inflammation) of the tissue that connects the ribs to the breastbone (sternum). This tissue is called cartilage. Costochondritis causes pain in the front of the chest. Usually, the pain: Starts slowly. Is in more than one rib. What are the causes? The exact cause of this condition is not always known. It results from stress on the tissue in the affected area. The cause of this stress could be: Chest injury. Exercise or activity, such as lifting. Very bad coughing. What increases the risk? You are more likely to develop this condition if you: Are female. Are 18-40 years old. Recently started a new exercise or work activity. Have low levels of vitamin D. Have a condition that makes you cough often. What are the signs or symptoms? The main symptom of this condition is chest pain. The pain: Usually starts slowly and can be sharp or dull. Gets worse with deep breathing, coughing, or exercise. Gets better with rest. May be worse when you press on the affected area of your ribs and breastbone. How is this treated? This condition usually goes away on its own over time. Your doctor may prescribe an NSAID, such as ibuprofen. This can help reduce pain and inflammation. Treatment may also include: Resting and avoiding activities that make pain worse. Putting heat or ice on the painful area. Doing exercises to stretch your chest muscles. If these treatments do not help, your doctor may inject a numbing medicine to help relieve the pain. Follow these instructions at home: Managing pain, stiffness, and swelling     If told, put ice on the painful area. To do this: Put ice in a plastic bag. Place a towel between your skin and the bag. Leave the ice on for 20 minutes, 2-3 times a day. If told, put heat on the affected area. Do this as often as told by your doctor. Use the heat source that your doctor recommends, such as a moist heat  pack or a heating pad. Place a towel between your skin and the heat source. Leave the heat on for 20-30 minutes. Take off the heat if your skin turns bright red. This is very important if you cannot feel pain, heat, or cold. You may have a greater risk of getting burned. Activity Rest as told by your doctor. Do not do anything that makes your pain worse. This includes any activities that use chest, belly (abdomen), and side muscles. Do not lift anything that is heavier than 10 lb (4.5 kg), or the limit that you are told, until your doctor says that it is safe. Return to your normal activities as told by your doctor. Ask your doctor what activities are safe for you. General instructions Take over-the-counter and prescription medicines only as told by your doctor. Keep all follow-up visits as told by your doctor. This is important. Contact a doctor if: You have chills or a fever. Your pain does not go away or it gets worse. You have a cough that does not go away. Get help right away if: You are short of breath. You have very bad chest pain that is not helped by medicines, heat, or ice. These symptoms may be an emergency. Do not wait to see if the symptoms will go away. Get medical help right away. Call your local emergency services (911 in the [place] Do not drive yourself to the hospital. Summary Costochondritis is irritation and swelling (inflammation) of the tissue that connects the ribs to the breastbone (  sternum). This condition causes pain in the front of the chest. Treatment may include medicines, rest, heat or ice, and exercises. This information is not intended to replace advice given to you by your health care provider. Make sure you discuss any questions you have with your health care provider. Document Revised: 11/26/2021 Document Reviewed: 07/22/2019 Elsevier Patient Education  [place].

## 2022-07-09 NOTE — Telephone Encounter (Signed)
Yes ma'am, done. I'm showing that neither system is reimbursable. She may need a PA which shouldn't be tough in this case given that she is insulin dependent and on Ozempic. I will forward to [place] to see if we can start a PA.

## 2022-07-09 NOTE — Progress Notes (Signed)
Subjective:  Patient ID: [name], female    DOB: 1970-10-13  Age: 51 y.o. MRN: [id number]  CC: Diabetes   [name] is a 51 y.o. year old female with a history of type 1 diabetes mellitus (A1c 9.5), hypertension, obstructive sleep apnea, asthma, depression, s/p tracheostomy secondary to intubation related tracheal injury, subglottic stenosis (status post balloon dilatation and Kenalog injection of stenosis tracheostomy tube exchange by ENT, status post right rotator cuff surgery in 07/2019. Uses 2L oxygen at night, s/p R L4L5 transforaminal lumbar interbody fusion with rods, screws and cage, local bone graft, allogen bone graft, vivigen in 09/2021  Interval History:  Complains of cramps in he right chest wall which feels like 'a Charlie horse' which is similar to what she feels in her stomach and a it started a few days ago while sitting in the bed. It lasted about 15 minutes then resolved.  Denies presence of dyspnea.  She has been coughing a lot and producing bloody tissue from her trach. Her appointment with ENT is in 2 weeks.  Blood sugars at home have been around 200 despite adherence with her Ozempic, Tyler Aas.  She is unable to exercise due to her chronic back pain.  A1c is 9.5 down from 10.0 previously and Ozempic dose was increased at her last visit.  BP is elevated and she is yet to take her antihypertensive  She is using her nebulizer machine a lot now that the weather is colder.  Endorses adherence with Dulera and her MDI. Migraines are controlled on Elavil and she finds she is doing better on it than she was on Topamax. Her back continues to hurt and she has been advised by orthopedics to use her back brace.  An MRI is being scheduled to evaluate her ongoing pain Past Medical History:  Diagnosis Date   Allergy    Anxiety    Arthritis    Asthma    Chest pain of uncertain etiology 1/77/1165   Chronic back pain    Chronic chest pain     resolved, no problems since 2019 per patient 07/27/19   COPD (chronic obstructive pulmonary disease) ([place])    Depression    DM (diabetes mellitus) ([place])    INSULIN DEPENDENT - TYPE 1   Dyspnea    GERD (gastroesophageal reflux disease)    Headache(784.0)    History of hiatal hernia    Hyperlipidemia    no med, diet controlled   Hypertension    Hypokalemia    Pneumonia    Respiratory disease 05/2017   uses inhalers, neb tx prn, no oxygen   Sleep apnea    does not use CPAP due to trach   Status post tracheostomy ([place]) 03/14/2017   Last Assessment & Plan:  Formatting of this note might be different from the original. - [place] - On tach - ENT following along (please see above for current trach management plan)   Tracheostomy in place [facility]) 02/2017    Past Surgical History:  Procedure Laterality Date   ABDOMINAL HYSTERECTOMY  2009   APPENDECTOMY     BIOPSY  10/15/2021   Procedure: BIOPSY;  Surgeon: Thornton Park, MD;  Location: [place];  Service: Gastroenterology;;   CESAREAN SECTION     x 3   CHOLECYSTECTOMY N/A 03/02/2014   Procedure: LAPAROSCOPIC CHOLECYSTECTOMY;  Surgeon: Joyice Faster. Cornett, MD;  Location: [place];  Service: General;  Laterality: N/A;   ESOPHAGOGASTRODUODENOSCOPY (EGD) WITH PROPOFOL  N/A 10/15/2021   Procedure: ESOPHAGOGASTRODUODENOSCOPY (EGD) WITH PROPOFOL;  Surgeon: Thornton Park, MD;  Location: [place];  Service: Gastroenterology;  Laterality: N/A;   HERNIA REPAIR     PANENDOSCOPY N/A 03/04/2017   Procedure: PANENDOSCOPY WITH POSSIBLE FOREIGN BODY REMOVAL;  Surgeon: Jodi Marble, MD;  Location: [place];  Service: ENT;  Laterality: N/A;   ROTATOR CUFF REPAIR Left    ROTATOR CUFF REPAIR Right 07/27/2019   SHOULDER ARTHROSCOPY WITH SUBACROMIAL DECOMPRESSION, ROTATOR CUFF REPAIR AND BICEP TENDON REPAIR Right 07/28/2019   Procedure: RIGHT SHOULDER ARTHROSCOP, MINI OPEN ROTATOR CUFF TEAR REPAIR,  BICEPS TENODESIS,  DISTAL CLAVICLE EXCISION;  Surgeon: Meredith Pel, MD;  Location: [facility];  Service: Orthopedics;  Laterality: Right;   TOOTH EXTRACTION N/A 02/22/2021   Procedure: DENTAL RESTORATION/EXTRACTIONS;  Surgeon: Diona Browner, DMD;  [facility];  Service: Oral Surgery;  Laterality: N/A;   TRACHEOSTOMY  02/2017   TUBAL LIGATION     VESICOVAGINAL FISTULA CLOSURE W/ TAH  2009    Family History  Problem Relation Age of Onset   Heart attack Mother    Stroke Mother    Diabetes Mother    Hypertension Mother    Arthritis Mother    Stroke Father    Asthma Sister    Hypertension Sister    Asthma Sister    Diabetes Sister    Asthma Brother    Seizures Brother    Asthma Brother    Diabetes Brother    Asthma Daughter    Asthma Son    Asthma Grandson    Colon cancer Neg Hx    Rectal cancer Neg Hx    Stomach cancer Neg Hx    Esophageal cancer Neg Hx    Ovarian cancer Neg Hx    Pancreatic cancer Neg Hx     Social History   Socioeconomic History   Marital status: Married    Spouse name: Journalist, newspaper   Number of children: Not on file   Years of education: Not on file   Highest education level: Not on file  Occupational History   Not on file  Tobacco Use   Smoking status: Former    Packs/day: 0.50    Years: 25.00    Total pack years: 12.50    Types: Cigarettes    Quit date: 01/21/2016    Years since quitting: 6.4   Smokeless tobacco: Never  Vaping Use   Vaping Use: Never used  Substance and Sexual Activity   Alcohol use: Yes    Comment: social wine   Drug use: Not Currently    Types: Marijuana    Comment: couple times a month   Sexual activity: Yes    Partners: Male    Birth control/protection: Surgical    Comment: Hysterectomy  Other Topics Concern   Not on file  Social History Narrative   Lives with husband   Social Determinants of Health   Financial Resource Strain: Medium Risk (06/06/2021)   Overall Financial Resource Strain (CARDIA)    Difficulty of Paying Living  Expenses: Somewhat hard  Food Insecurity: Food Insecurity Present (06/06/2021)   Hunger Vital Sign    Worried About Running Out of Food in the Last Year: Sometimes true    Ran Out of Food in the Last Year: Sometimes true  Transportation Needs: No Transportation Needs (06/06/2021)   PRAPARE - Hydrologist (Medical): No    Lack of Transportation (Non-Medical): No  Physical Activity: Inactive (06/06/2021)  Exercise Vital Sign    Days of Exercise per Week: 0 days    Minutes of Exercise per Session: 0 min  Stress: Stress Concern Present (06/06/2021)   [place]    Feeling of Stress : Very much  Social Connections: Moderately Isolated (06/06/2021)   Social Connection and Isolation Panel [NHANES]    Frequency of Communication with Friends and Family: More than three times a week    Frequency of Social Gatherings with Friends and Family: More than three times a week    Attends Religious Services: Never    Marine scientist or Organizations: No    Attends Archivist Meetings: Never    Marital Status: Married    Allergies  Allergen Reactions   Reglan [Metoclopramide] Other (See Comments)    Panic attack    Outpatient Medications Prior to Visit  Medication Sig Dispense Refill   albuterol (PROAIR HFA) 108 (90 Base) MCG/ACT inhaler Inhale 2 puffs into the lungs every 6 (six) hours as needed for wheezing or shortness of breath. 18 g 6   albuterol (PROVENTIL) (2.5 MG/3ML) 0.083% nebulizer solution Take 3 mLs (2.5 mg total) by nebulization every 6 (six) hours as needed for wheezing or shortness of breath. 75 mL 3   aspirin EC 81 MG EC tablet Take 1 tablet (81 mg total) by mouth daily. Swallow whole. 30 tablet 11   atorvastatin (LIPITOR) 80 MG tablet Take 1 tablet (80 mg total) by mouth daily. 90 tablet 1   Blood Glucose Monitoring Suppl (ONETOUCH VERIO) w/Device KIT 1 each by Does not apply  route daily. Use to monitor glucose levels daily; E11.9 1 kit 0   buPROPion (WELLBUTRIN SR) 150 MG 12 hr tablet Take 1 tablet (150 mg total) by mouth 2 (two) times daily. 180 tablet 1   cetirizine (ZYRTEC) 10 MG tablet TAKE 1 TABLET BY MOUTH AT BEDTIME 30 tablet 0   diclofenac Sodium (VOLTAREN) 1 % GEL Apply 4 g topically 4 (four) times daily. 100 g 1   glucose blood (ONETOUCH VERIO) test strip Use to check glucose levels TID. 100 each 2   hydrOXYzine (ATARAX) 25 MG tablet Take 1 tablet (25 mg total) by mouth 3 (three) times daily as needed. for anxiety 180 tablet 1   Insulin Pen Needle 31G X 8 MM MISC Inject 1 Units as directed daily.     lisinopril (ZESTRIL) 20 MG tablet Take 1 tablet (20 mg total) by mouth daily. 90 tablet 1   melatonin 3 MG TABS tablet Take 1 tablet (3 mg total) by mouth at bedtime. 30 tablet 3   Misc. Devices MISC Nebulizer device. Diagnosis - Asthma 1 each 0   Misc. Devices Gary Shower chair 1 each 0   Misc. Devices MISC Bedside commode. Diagnosis- low back pain 1 each 0   Misc. Devices [employer].  Diagnosis-low back pain 1 each 0   Misc. Devices [employer].  Diagnosis-low back pain 1 each 0   ondansetron (ZOFRAN) 4 MG tablet Take 1 tablet (4 mg total) by mouth every 8 (eight) hours as needed for nausea or vomiting. 30 tablet 0   OneTouch Delica Lancets 58X MISC 1 each by Does not apply route daily. Use to monitor glucose levels daily; E11.9 100 each 2   oxyCODONE-acetaminophen (PERCOCET) 10-325 MG tablet Take 1 tablet by mouth 5 (five) times daily as needed.     pregabalin (LYRICA) 50 MG capsule Take 1 capsule (  50 mg total) by mouth in the morning, at noon, and at bedtime. 90 capsule 0   Semaglutide, 2 MG/DOSE, 8 MG/3ML SOPN Inject 2 mg as directed once a week. After completion of 4-week course of 1 mg dose 3 mL 6   sertraline (ZOLOFT) 100 MG tablet Take 1 tablet (100 mg total) by mouth at bedtime. 90 tablet 1   tiZANidine (ZANAFLEX) 4 MG tablet Take 1 tablet (4 mg total)  by mouth every 8 (eight) hours as needed for muscle spasms. 60 tablet 2   Vitamin D, Ergocalciferol, (DRISDOL) 1.25 MG (50000 UNIT) CAPS capsule Take 1 capsule (50,000 Units total) by mouth every 7 (seven) days. 5 capsule 0   amitriptyline (ELAVIL) 10 MG tablet Take 1 tablet (10 mg total) by mouth at bedtime. 30 tablet 3   dapagliflozin propanediol (FARXIGA) 5 MG TABS tablet Take 1 tablet (5 mg total) by mouth daily before breakfast. 90 tablet 1   docusate sodium (COLACE) 100 MG capsule Take 1 capsule (100 mg total) by mouth 2 (two) times daily. 40 capsule 0   furosemide (LASIX) 20 MG tablet Take 1 tablet (20 mg total) by mouth daily. 30 tablet 2   insulin degludec (TRESIBA FLEXTOUCH) 200 UNIT/ML FlexTouch Pen Inject 150 Units into the skin daily. 54 mL 3   mometasone-formoterol (DULERA) 200-5 MCG/ACT AERO Inhale 2 puffs into the lungs in the morning and at bedtime. 1 each 6   pantoprazole (PROTONIX) 40 MG tablet Take 1 tablet (40 mg total) by mouth 2 (two) times daily. 60 tablet 3   potassium chloride SA (KLOR-CON M) 20 MEQ tablet Take 1 tablet (20 mEq total) by mouth daily. 7 tablet 0   Semaglutide, 1 MG/DOSE, 4 MG/3ML SOPN Inject 1 mg as directed once a week. 4 weeks then increase to 2 mg once a week 3 mL 0   No facility-administered medications prior to visit.     ROS Review of Systems  Constitutional:  Negative for activity change and appetite change.  HENT:  Negative for sinus pressure and sore throat.   Respiratory:  Positive for cough. Negative for chest tightness, shortness of breath and wheezing.   Cardiovascular:  Negative for chest pain and palpitations.  Gastrointestinal:  Negative for abdominal distention, abdominal pain and constipation.  Genitourinary: Negative.   Musculoskeletal:  Positive for back pain.  Psychiatric/Behavioral:  Negative for behavioral problems and dysphoric mood.     Objective:  BP (!) 143/95   Pulse (!) 115   Temp 98.5 F (36.9 C) (Oral)   Wt 249  lb 12.8 oz (113.3 kg)   LMP  (LMP Unknown)   SpO2 100%   BMI 47.20 kg/m      07/09/2022   10:15 AM 07/07/2022    3:06 PM 06/16/2022    2:57 PM  BP/Weight  Systolic BP 161 096 045  Diastolic BP 95 409 96  Wt. (Lbs) 249.8 250 250  BMI 47.2 kg/m2 47.24 kg/m2 47.24 kg/m2      Physical Exam Constitutional:      Appearance: She is well-developed.  Neck:     Comments: Tracheostomy tube in place Cardiovascular:     Rate and Rhythm: Tachycardia present.     Heart sounds: Normal heart sounds. No murmur heard. Pulmonary:     Effort: Pulmonary effort is normal.     Breath sounds: Normal breath sounds. No wheezing or rales.  Chest:     Chest wall: Tenderness (Reproducible right upper chest wall tenderness) present.  Abdominal:     General: Bowel sounds are normal. There is no distension.     Palpations: Abdomen is soft. There is no mass.     Tenderness: There is no abdominal tenderness.  Musculoskeletal:     Right lower leg: No edema.     Left lower leg: No edema.     Comments: Lumbar bulge which is slightly tender  Neurological:     Mental Status: She is alert and oriented to person, place, and time.  Psychiatric:        Mood and Affect: Mood normal.    Diabetic Foot Exam - Simple   Simple Foot Form Diabetic Foot exam was performed with the following findings: Yes 07/09/2022 10:48 AM  Visual Inspection No deformities, no ulcerations, no other skin breakdown bilaterally: Yes Sensation Testing Intact to touch and monofilament testing bilaterally: Yes Pulse Check Posterior Tibialis and Dorsalis pulse intact bilaterally: Yes Comments         Latest Ref Rng & Units 04/08/2022   11:05 AM 11/14/2021   10:59 AM 11/01/2021    4:47 PM  CMP  Glucose 70 - 99 mg/dL 126  187  153   BUN 6 - 24 mg/dL _0 Creatinine 0.57 - 1.00 mg/dL 1.08  0.88  1.00   Sodium 134 - 144 mmol/L 142  141  139   Potassium 3.5 - 5.2 mmol/L 4.2  3.6  3.3   Chloride 96 - 106 mmol/L 106  104   106   CO2 20 - 29 mmol/L _1 Calcium 8.7 - 10.2 mg/dL 9.4  9.3  8.9   Total Protein 6.0 - 8.5 g/dL 7.5   8.1   Total Bilirubin 0.0 - 1.2 mg/dL 0.2   1.1   Alkaline Phos 44 - 121 IU/L 143   94   AST 0 - 40 IU/L 16   24   ALT 0 - 32 IU/L 23   18     Lipid Panel     Component Value Date/Time   CHOL 183 04/08/2022 1105   TRIG 148 04/08/2022 1105   HDL 49 04/08/2022 1105   CHOLHDL 3.2 06/15/2018 1031   CHOLHDL 3.9 02/22/2016 0954   VLDL 26 02/22/2016 0954   LDLCALC 108 (H) 04/08/2022 1105    CBC    Component Value Date/Time   WBC 8.2 11/01/2021 1647   RBC 4.22 11/01/2021 1647   HGB 11.5 (L) 11/01/2021 1647   HGB 11.9 04/12/2018 1035   HCT 35.5 (L) 11/01/2021 1647   HCT 34.8 04/12/2018 1035   PLT 451 (H) 11/01/2021 1647   PLT 373 04/12/2018 1035   MCV 84.1 11/01/2021 1647   MCV 84 04/12/2018 1035   MCH 27.3 11/01/2021 1647   MCHC 32.4 11/01/2021 1647   RDW 15.7 (H) 11/01/2021 1647   RDW 14.6 04/12/2018 1035   LYMPHSABS 2.0 11/01/2021 1647   LYMPHSABS 4.0 (H) 04/12/2018 1035   MONOABS 0.4 11/01/2021 1647   EOSABS 0.0 11/01/2021 1647   EOSABS 0.0 04/12/2018 1035   BASOSABS 0.0 11/01/2021 1647   BASOSABS 0.0 04/12/2018 1035    Lab Results  Component Value Date   HGBA1C 9.5 (A) 07/09/2022    Assessment & Plan:  1. Type 1 diabetes mellitus with other specified complication (HCC) Uncontrolled with A1c of 9.5 Increased dose of Tresiba from 150 units to 160 units and she has been advised to decrease by 2 units if hypoglycemia  occurs Increased Farxiga dose from 5 mg to 10 mg Counseled on Diabetic diet, my plate method, 622 minutes of moderate intensity exercise/week Blood sugar logs with fasting goals of 80-120 mg/dl, random of less than 180 and in the event of sugars less than 60 mg/dl or greater than 400 mg/dl encouraged to notify the clinic. Advised on the need for annual eye exams, annual foot exams, Pneumonia vaccine. - POCT glucose (manual entry) - POCT  glycosylated hemoglobin (Hb A1C) - dapagliflozin propanediol (FARXIGA) 10 MG TABS tablet; Take 1 tablet (10 mg total) by mouth daily before breakfast.  Dispense: 90 tablet; Refill: 1 - Basic Metabolic Panel - insulin degludec (TRESIBA FLEXTOUCH) 200 UNIT/ML FlexTouch Pen; Inject 160 Units into the skin daily.  Dispense: 54 mL; Refill: 3 - CMP14+EGFR - LP+Non-HDL Cholesterol  2. Intractable migraine with aura without status migrainosus Controlled - amitriptyline (ELAVIL) 10 MG tablet; Take 1 tablet (10 mg total) by mouth at bedtime.  Dispense: 90 tablet; Refill: 1  3. Moderate persistent asthma without complication Uncontrolled especially with the weather change We will add on Singulair - montelukast (SINGULAIR) 10 MG tablet; Take 1 tablet (10 mg total) by mouth at bedtime.  Dispense: 90 tablet; Refill: 1 - mometasone-formoterol (DULERA) 200-5 MCG/ACT AERO; Inhale 2 puffs into the lungs in the morning and at bedtime.  Dispense: 1 each; Refill: 6  4. Gastroesophageal reflux disease without esophagitis Stable - pantoprazole (PROTONIX) 40 MG tablet; Take 1 tablet (40 mg total) by mouth 2 (two) times daily.  Dispense: 180 tablet; Refill: 1  5. Chronic cough We will place on Tessalon Perles She will need an indirect laryngoscopy and advised to call her ENT specialist and notify him - benzonatate (TESSALON) 100 MG capsule; Take 1 capsule (100 mg total) by mouth 2 (two) times daily as needed for cough.  Dispense: 20 capsule; Refill: 0  6. Pedal edema Dependent edema Encouraged to comply with a low-sodium diet, elevate feet, use compression stockings - furosemide (LASIX) 20 MG tablet; Take 1 tablet (20 mg total) by mouth daily.  Dispense: 90 tablet; Refill: 1  7. Hypertension associated with diabetes ([place]) Uncontrolled She is yet to take her antihypertensive Advised to take antihypertensive before office visits Counseled on blood pressure goal of less than 130/80, low-sodium, DASH diet,  medication compliance, 150 minutes of moderate intensity exercise per week. Discussed medication compliance, adverse effects.   8. Hyperlipidemia due to type 1 diabetes mellitus ([place]) LDL is above goal We will check lipid panel today  9. Bulging lumbar disc Uncontrolled Status post lumbar spine surgery Follow-up with orthopedic  10. Morbid (severe) obesity due to excess calories ([place]) Unfortunately she is unable to exercise due to her back pain Caloric restriction is important She remains on a GLP-1 RA  11. Costochondritis Discussed pathophysiology of costochondritis Advised to use topical NSAID  12. Need for immunization against influenza - Flu Vaccine QUAD 71mo+IM (Fluarix, Fluzone & Alfiuria Quad PF)   Meds ordered this encounter  Medications   dapagliflozin propanediol (FARXIGA) 10 MG TABS tablet    Sig: Take 1 tablet (10 mg total) by mouth daily before breakfast.    Dispense:  90 tablet    Refill:  1    Dose increase   benzonatate (TESSALON) 100 MG capsule    Sig: Take 1 capsule (100 mg total) by mouth 2 (two) times daily as needed for cough.    Dispense:  20 capsule    Refill:  0   insulin degludec (TRESIBA  FLEXTOUCH) 200 UNIT/ML FlexTouch Pen    Sig: Inject 160 Units into the skin daily.    Dispense:  54 mL    Refill:  3    Dose increase   montelukast (SINGULAIR) 10 MG tablet    Sig: Take 1 tablet (10 mg total) by mouth at bedtime.    Dispense:  90 tablet    Refill:  1   amitriptyline (ELAVIL) 10 MG tablet    Sig: Take 1 tablet (10 mg total) by mouth at bedtime.    Dispense:  90 tablet    Refill:  1    Discontinue Topamax   furosemide (LASIX) 20 MG tablet    Sig: Take 1 tablet (20 mg total) by mouth daily.    Dispense:  90 tablet    Refill:  1   mometasone-formoterol (DULERA) 200-5 MCG/ACT AERO    Sig: Inhale 2 puffs into the lungs in the morning and at bedtime.    Dispense:  1 each    Refill:  6   pantoprazole (PROTONIX) 40 MG tablet    Sig: Take 1  tablet (40 mg total) by mouth 2 (two) times daily.    Dispense:  180 tablet    Refill:  1    Follow-up: Return in about 3 months (around 10/09/2022) for Chronic medical conditions.       [name], MD, FAAFP. [facility] [place] [place], [place]   07/09/2022, 1:17 PM

## 2022-07-09 NOTE — Addendum Note (Signed)
Addended by: Daisy Blossom, Annie Main L on: 07/09/2022 02:16 PM   Modules accepted: Orders

## 2022-07-09 NOTE — Telephone Encounter (Signed)
Thank you Claiborne Billings!!!   Dr. Margarita Rana,   [facility] [place] sent to patient's pref pharmacy. Thanks for including me!

## 2022-07-09 NOTE — Telephone Encounter (Signed)
Can you please send a prescription for CGM which is covered by her insurance to the pharmacy for her?  Thank you

## 2022-07-09 NOTE — Progress Notes (Signed)
Getting cramps in chest

## 2022-07-10 ENCOUNTER — Other Ambulatory Visit: Payer: Self-pay | Admitting: Family Medicine

## 2022-07-10 DIAGNOSIS — Z930 Tracheostomy status: Secondary | ICD-10-CM | POA: Diagnosis not present

## 2022-07-10 DIAGNOSIS — E119 Type 2 diabetes mellitus without complications: Secondary | ICD-10-CM | POA: Diagnosis not present

## 2022-07-10 DIAGNOSIS — M545 Low back pain, unspecified: Secondary | ICD-10-CM | POA: Diagnosis not present

## 2022-07-10 DIAGNOSIS — M5416 Radiculopathy, lumbar region: Secondary | ICD-10-CM | POA: Diagnosis not present

## 2022-07-10 DIAGNOSIS — G8929 Other chronic pain: Secondary | ICD-10-CM | POA: Diagnosis not present

## 2022-07-10 DIAGNOSIS — Z79899 Other long term (current) drug therapy: Secondary | ICD-10-CM | POA: Diagnosis not present

## 2022-07-10 DIAGNOSIS — R030 Elevated blood-pressure reading, without diagnosis of hypertension: Secondary | ICD-10-CM | POA: Diagnosis not present

## 2022-07-10 LAB — CMP14+EGFR
ALT: 28 IU/L (ref 0–32)
AST: 26 IU/L (ref 0–40)
Albumin/Globulin Ratio: 1.4 (ref 1.2–2.2)
Albumin: 4.4 g/dL (ref 3.8–4.9)
Alkaline Phosphatase: 141 IU/L — ABNORMAL HIGH (ref 44–121)
BUN/Creatinine Ratio: 11 (ref 9–23)
BUN: 14 mg/dL (ref 6–24)
Bilirubin Total: 0.2 mg/dL (ref 0.0–1.2)
CO2: 22 mmol/L (ref 20–29)
Calcium: 9.2 mg/dL (ref 8.7–10.2)
Chloride: 102 mmol/L (ref 96–106)
Creatinine, Ser: 1.24 mg/dL — ABNORMAL HIGH (ref 0.57–1.00)
Globulin, Total: 3.2 g/dL (ref 1.5–4.5)
Glucose: 217 mg/dL — ABNORMAL HIGH (ref 70–99)
Potassium: 4.5 mmol/L (ref 3.5–5.2)
Sodium: 141 mmol/L (ref 134–144)
Total Protein: 7.6 g/dL (ref 6.0–8.5)
eGFR: 52 mL/min/{1.73_m2} — ABNORMAL LOW (ref >59)

## 2022-07-10 LAB — LP+NON-HDL CHOLESTEROL
Cholesterol, Total: 224 mg/dL — ABNORMAL HIGH (ref 100–199)
HDL: 50 mg/dL (ref >39)
LDL Chol Calc (NIH): 148 mg/dL — ABNORMAL HIGH (ref 0–99)
Total Non-HDL-Chol (LDL+VLDL): 174 mg/dL — ABNORMAL HIGH (ref 0–129)
Triglycerides: 142 mg/dL (ref 0–149)
VLDL Cholesterol Cal: 26 mg/dL (ref 5–40)

## 2022-07-10 MED ORDER — ROSUVASTATIN CALCIUM 20 MG PO TABS: 20.0000 mg | ORAL_TABLET | Freq: Every day | ORAL | 1 refills | Status: DC

## 2022-07-15 ENCOUNTER — Encounter: Payer: Self-pay | Admitting: Family Medicine

## 2022-07-20 ENCOUNTER — Ambulatory Visit
Admission: RE | Admit: 2022-07-20 | Discharge: 2022-07-20 | Disposition: A | Discharge status: Home / Self Care | Payer: Medicare Other | Source: Ambulatory Visit | Attending: Specialist | Admitting: Specialist

## 2022-07-20 DIAGNOSIS — R200 Anesthesia of skin: Secondary | ICD-10-CM | POA: Diagnosis not present

## 2022-07-20 DIAGNOSIS — Z981 Arthrodesis status: Secondary | ICD-10-CM

## 2022-07-20 DIAGNOSIS — M545 Low back pain, unspecified: Secondary | ICD-10-CM | POA: Diagnosis not present

## 2022-07-21 ENCOUNTER — Ambulatory Visit (INDEPENDENT_AMBULATORY_CARE_PROVIDER_SITE_OTHER): Payer: Medicare Other | Admitting: Specialist

## 2022-07-21 ENCOUNTER — Encounter: Payer: Self-pay | Admitting: Specialist

## 2022-07-21 VITALS — BP 161/92 | HR 99 | Ht 61.0 in | Wt 249.0 lb

## 2022-07-21 DIAGNOSIS — M4316 Spondylolisthesis, lumbar region: Secondary | ICD-10-CM

## 2022-07-21 DIAGNOSIS — Z981 Arthrodesis status: Secondary | ICD-10-CM | POA: Diagnosis not present

## 2022-07-21 DIAGNOSIS — R700 Elevated erythrocyte sedimentation rate: Secondary | ICD-10-CM

## 2022-07-21 DIAGNOSIS — M545 Low back pain, unspecified: Secondary | ICD-10-CM

## 2022-07-21 DIAGNOSIS — E559 Vitamin D deficiency, unspecified: Secondary | ICD-10-CM

## 2022-07-21 DIAGNOSIS — M961 Postlaminectomy syndrome, not elsewhere classified: Secondary | ICD-10-CM | POA: Diagnosis not present

## 2022-07-21 DIAGNOSIS — G8929 Other chronic pain: Secondary | ICD-10-CM | POA: Diagnosis not present

## 2022-07-21 NOTE — Progress Notes (Signed)
Office Visit Note   Patient: [name]           Date of Birth: 08-24-1971           MRN: [id number] Visit Date: 07/21/2022              Requested by: Brittney Rakes, MD [name],  [place] [address] PCP: Brittney Rakes, MD   Assessment & Plan: Visit Diagnoses:  1. Spondylolisthesis of lumbar region   2. S/P lumbar fusion   3. Elevated sed rate (elev SR)   4. Post laminectomy syndrome   5. Chronic low back pain, unspecified back pain laterality, unspecified whether sciatica present   6. Vitamin D deficiency    She has an elevated sed rate and CRP but negative MRI of the lumbar spine 11 mo post op L4-5 TLIF with no sign of infection. Likely elevated studies relate to upper respiratory disease. There is no residual stenosis but mild or minimal DDD above and below level of fusion. Vitamin D Deficiency  that is being treated and likely is related to slow healing of the L4-5 fusion. Recommend continued surveillance to ensure the TLIF with go on to heal. She is to enter into a therapy program with goals of improving her endurance in  Walking and standing.   Plan: Follow-Up Instructions: No follow-ups on file.  Avoid frequent bending and stooping  No lifting greater than 10 lbs. May use ice or moist heat for pain. Weight loss is of benefit. Recommend stopping use of gabapentin and we with try pregablin 50 mg po TID.  Obtain MRI to assess for a localized area of inflamation or infection with Sed rate and CRP.  Vitamin D supplements as delayed healing may cause pain and this can be due to low VItamin D Level.  Exercise is important to improve your indurance and does allow people to function better inspite of back pain.  Follow-Up Instructions: Return in about 4 weeks (around 08/18/2022) for F/U w/ Dr. Laurance Flatten  4 wks persistent pain&claud symptoms 1 year after TLIF L4-5 spondylolisthesis.   Orders:  No orders of the defined types were placed  in this encounter.  No orders of the defined types were placed in this encounter.     Procedures: No procedures performed   Clinical Data: Findings:  CLINICAL DATA:  Low back pain with bilateral leg numbness for the last month   EXAM: MRI LUMBAR SPINE WITHOUT CONTRAST   TECHNIQUE: Multiplanar, multisequence MR imaging of the lumbar spine was performed. No intravenous contrast was administered.   COMPARISON:  CT scan 04/09/2022 and MRI 01/16/2022   FINDINGS: Segmentation: The lowest lumbar type non-rib-bearing vertebra is labeled as L5.   Alignment:  No vertebral subluxation is observed.   Vertebrae: Metal artifact from posterolateral rod and pedicle screw fixation with interbody cage. No significant vertebral marrow edema is identified.   Conus medullaris and cauda equina: Conus extends to the L1 level. Conus and cauda equina appear normal without substantial nerve root clumping to suggest arachnoiditis.   Paraspinal and other soft tissues: Unremarkable   Disc levels:   T12-L1: Unremarkable.   L1-2: Unremarkable.   L2-3: Unremarkable.   L3-4: No impingement, mild disc bulge.   L4-5: No impingement.  PLIF with right facetectomy.   L5-S1: No impingement. Mild disc bulge. Tapered thecal sac at this level.   IMPRESSION: 1. No impingement is identified in the lumbar spine to explain the patient's symptoms. 2. Prior  L4-5 PLIF and right facetectomy. 3. Mild disc bulges at L3-4 and L5-S1.     Electronically Signed   By: Van Clines M.D.   On: 07/20/2022 16:56      Subjective: Chief Complaint  Patient presents with   Lower Back - Pain, Follow-up    MRI review    51 year old female with history of back surgery L4-5 spondylolisthesis treated with TLIF 08/20/2021. She has history of upper airway obstructive and severe dynpnea resulting in a tracheiostomy years ago. Not likely to have a conversion to normal airway. She is still painful and recent  testing showed elevated sed rate and CRP. MRI was done to assess for any sign of persistent nerve compression or for areas that may have signs of infection.    Review of Systems  Constitutional: Negative.   HENT: Negative.    Eyes: Negative.   Respiratory: Negative.    Cardiovascular: Negative.   Gastrointestinal: Negative.   Endocrine: Negative.   Genitourinary: Negative.   Musculoskeletal: Negative.   Skin: Negative.   Allergic/Immunologic: Negative.   Neurological: Negative.   Hematological: Negative.   Psychiatric/Behavioral: Negative.       Objective: Vital Signs: BP (!) 161/92   Pulse 99   Ht '5\' 1"'$  (1.549 m)   Wt 249 lb (112.9 kg)   LMP  (LMP Unknown)   BMI 47.05 kg/m   Physical Exam Constitutional:      Appearance: She is well-developed.  HENT:     Head: Normocephalic and atraumatic.  Eyes:     Pupils: Pupils are equal, round, and reactive to light.  Pulmonary:     Effort: Pulmonary effort is normal.     Breath sounds: Normal breath sounds.  Abdominal:     General: Bowel sounds are normal.     Palpations: Abdomen is soft.  Musculoskeletal:     Cervical back: Normal range of motion and neck supple.     Lumbar back: Negative right straight leg raise test and negative left straight leg raise test.  Skin:    General: Skin is warm and dry.  Neurological:     Mental Status: She is alert and oriented to person, place, and time.  Psychiatric:        Behavior: Behavior normal.        Thought Content: Thought content normal.        Judgment: Judgment normal.   Back Exam   Tenderness  The patient is experiencing tenderness in the lumbar.  Range of Motion  Extension:  abnormal  Flexion:  abnormal  Lateral bend right:  abnormal  Lateral bend left:  abnormal  Rotation right:  abnormal  Rotation left:  abnormal   Tests  Straight leg raise right: negative Straight leg raise left: negative  Comments:  Motor is intact but gives awy with all major motor  groups including knee extension, foot DF and hip flexion.     Specialty Comments:  MRI LUMBAR SPINE WITHOUT AND WITH CONTRAST   TECHNIQUE: Multiplanar and multiecho pulse sequences of the lumbar spine were obtained without and with intravenous contrast.   CONTRAST:  58m MULTIHANCE GADOBENATE DIMEGLUMINE 529 MG/ML IV SOLN   COMPARISON:  Lumbar radiographs 01/02/2022, lumbar spine MRI 12/09/2020   FINDINGS: Segmentation: Standard; the lowest formed disc space is designated L5-S1.   Alignment: There is straightening of the normal lumbar lordosis. There is no antero or retrolisthesis.   Vertebrae: There has been interval posterior instrumented fusion at L4-L5 with suspected right  laminectomy. There is no evidence of complication. Marrow signal is otherwise normal. There is no suspicious marrow signal abnormality or marrow edema. There is no abnormal marrow enhancement.   Conus medullaris and cauda equina: Conus extends to the L1 level. Conus and cauda equina appear normal. There is no abnormal enhancement of the cauda equina nerve roots.   Paraspinal and other soft tissues: Postsurgical changes are noted in the soft tissues posterior to the surgical levels. The paraspinal soft tissues are otherwise unremarkable.   Disc levels:   The disc heights at the nonsurgical levels are preserved.   T12-L1: No significant spinal canal or neural foraminal stenosis   L1-L2: No significant spinal canal or neural foraminal stenosis   L2-L3: No significant spinal canal or neural foraminal stenosis   L3-L4: There is a minimal disc bulge without significant spinal canal or neural foraminal stenosis   L4-L5: Status post posterior instrumented fusion and suspected right laminectomy. The previously seen disc protrusion has resolved following surgery. There is residual mild narrowing of the left subarticular zone without evidence of frank nerve root impingement. There is no residual  spinal canal or neural foraminal stenosis. There is no convincing evidence of nerve root impingement or significant neural foraminal stenosis.   L5-S1: No significant spinal canal or neural foraminal stenosis.   IMPRESSION: 1. Status post posterior instrumented fusion and suspected right laminectomy at L4-L5. The previously seen disc protrusion on the study from 12/19/2020 is no longer present, and previously seen spinal canal stenosis has significantly improved. There is residual mild narrowing of the left subarticular zone without evidence of frank nerve root impingement. No other convincing evidence of nerve root impingement to explain the patient's right-sided symptoms. 2. No significant spinal canal or neural foraminal stenosis at the other levels.     Electronically Signed   By: Valetta Mole M.D.   On: 01/16/2022 13:29  Imaging: MR Lumbar Spine w/o contrast  Result Date: 07/20/2022 CLINICAL DATA:  Low back pain with bilateral leg numbness for the last month EXAM: MRI LUMBAR SPINE WITHOUT CONTRAST TECHNIQUE: Multiplanar, multisequence MR imaging of the lumbar spine was performed. No intravenous contrast was administered. COMPARISON:  CT scan 04/09/2022 and MRI 01/16/2022 FINDINGS: Segmentation: The lowest lumbar type non-rib-bearing vertebra is labeled as L5. Alignment:  No vertebral subluxation is observed. Vertebrae: Metal artifact from posterolateral rod and pedicle screw fixation with interbody cage. No significant vertebral marrow edema is identified. Conus medullaris and cauda equina: Conus extends to the L1 level. Conus and cauda equina appear normal without substantial nerve root clumping to suggest arachnoiditis. Paraspinal and other soft tissues: Unremarkable Disc levels: T12-L1: Unremarkable. L1-2: Unremarkable. L2-3: Unremarkable. L3-4: No impingement, mild disc bulge. L4-5: No impingement.  PLIF with right facetectomy. L5-S1: No impingement. Mild disc bulge. Tapered thecal  sac at this level. IMPRESSION: 1. No impingement is identified in the lumbar spine to explain the patient's symptoms. 2. Prior L4-5 PLIF and right facetectomy. 3. Mild disc bulges at L3-4 and L5-S1. Electronically Signed   By: Van Clines M.D.   On: 07/20/2022 16:56     PMFS History: Patient Active Problem List   Diagnosis Date Noted   Spondylolisthesis, lumbar region 08/20/2021    Priority: High    Class: Chronic   Abnormal UGI series    Coffee ground emesis 10/15/2021   Gastritis and gastroduodenitis    Coffee ground vomiting 10/14/2021   Fusion of spine of lumbar region 08/20/2021   Chronic respiratory failure with hypoxia ([place])  06/20/2021   Preop pulmonary/respiratory exam 06/20/2021   Pain of upper abdomen 11/28/2020   Recurrent vomiting 11/28/2020   COVID-19 09/16/2020   Dyspnea 08/26/2020   Rotator cuff tear 07/28/2019   Insulin dependent type 2 diabetes mellitus ([place]) 04/12/2019   Lactic acidosis 03/22/2019   Chronic right shoulder pain    Community acquired pneumonia 10/25/2018   Asthma, chronic, unspecified asthma severity, with acute exacerbation 10/25/2018   Acute respiratory failure with hypoxia and hypercapnia (HCC) 10/23/2018   Acute pain of right shoulder due to trauma 10/05/2018   Chronic bilateral low back pain without sciatica 10/05/2018   Dysphonia 10/05/2018   Severe asthma with exacerbation 10/05/2018   Rotator cuff arthropathy, right 06/15/2018   AKI (acute kidney injury) ([place]) 03/06/2018   Carpal tunnel syndrome 01/18/2018   Heart failure ([place]) 05/20/2017   Normocytic anemia 05/06/2017   Tracheostomy dependent ([place]) 03/26/2017   Dysphagia 03/26/2017   Klebsiella cystitis 03/14/2017   Morbid obesity with BMI of 40.0-44.9, adult ([place]) 03/13/2017   Subglottic stenosis 03/06/2017   Asthma 03/06/2017   Chronic diastolic heart failure ([place]) 03/06/2017   Tracheal stenosis 03/04/2017   Acute blood loss anemia    Generalized anxiety disorder     Chronic pain syndrome    GERD (gastroesophageal reflux disease) 02/22/2016   Depression 02/22/2016   Migraine 01/30/2015   Bulging lumbar disc 09/05/2013   Essential hypertension 09/05/2013   Allergic rhinitis, seasonal 08/11/2012   Chronic cough 08/11/2012   OSA (obstructive sleep apnea) 12/04/2011   Past Medical History:  Diagnosis Date   Allergy    Anxiety    Arthritis    Asthma    Chest pain of uncertain etiology 9/32/3557   Chronic back pain    Chronic chest pain    resolved, no problems since 2019 per patient 07/27/19   COPD (chronic obstructive pulmonary disease) (HCC)    Depression    DM (diabetes mellitus) ([place])    INSULIN DEPENDENT - TYPE 1   Dyspnea    GERD (gastroesophageal reflux disease)    Headache(784.0)    History of hiatal hernia    Hyperlipidemia    no med, diet controlled   Hypertension    Hypokalemia    Pneumonia    Respiratory disease 05/2017   uses inhalers, neb tx prn, no oxygen   Sleep apnea    does not use CPAP due to trach   Status post tracheostomy ([place]) 03/14/2017   Last Assessment & Plan:  Formatting of this note might be different from the original. - [place] - On tach - ENT following along (please see above for current trach management plan)   Tracheostomy in place [facility]) 02/2017    Family History  Problem Relation Age of Onset   Heart attack Mother    Stroke Mother    Diabetes Mother    Hypertension Mother    Arthritis Mother    Stroke Father    Asthma Sister    Hypertension Sister    Asthma Sister    Diabetes Sister    Asthma Brother    Seizures Brother    Asthma Brother    Diabetes Brother    Asthma Daughter    Asthma Son    Asthma Grandson    Colon cancer Neg Hx    Rectal cancer Neg Hx    Stomach cancer Neg Hx    Esophageal cancer Neg Hx    Ovarian cancer Neg Hx    Pancreatic  cancer Neg Hx     Past Surgical History:  Procedure Laterality Date   ABDOMINAL HYSTERECTOMY  2009    APPENDECTOMY     BIOPSY  10/15/2021   Procedure: BIOPSY;  Surgeon: Thornton Park, MD;  Location: [facility] ENDOSCOPY;  Service: Gastroenterology;;   CESAREAN SECTION     x 3   CHOLECYSTECTOMY N/A 03/02/2014   Procedure: LAPAROSCOPIC CHOLECYSTECTOMY;  Surgeon: Joyice Faster. Cornett, MD;  Location: [place];  Service: General;  Laterality: N/A;   ESOPHAGOGASTRODUODENOSCOPY (EGD) WITH PROPOFOL N/A 10/15/2021   Procedure: ESOPHAGOGASTRODUODENOSCOPY (EGD) WITH PROPOFOL;  Surgeon: Thornton Park, MD;  Location: [place];  Service: Gastroenterology;  Laterality: N/A;   HERNIA REPAIR     PANENDOSCOPY N/A 03/04/2017   Procedure: PANENDOSCOPY WITH POSSIBLE FOREIGN BODY REMOVAL;  Surgeon: Jodi Marble, MD;  Location: [place];  Service: ENT;  Laterality: N/A;   ROTATOR CUFF REPAIR Left    ROTATOR CUFF REPAIR Right 07/27/2019   SHOULDER ARTHROSCOPY WITH SUBACROMIAL DECOMPRESSION, ROTATOR CUFF REPAIR AND BICEP TENDON REPAIR Right 07/28/2019   Procedure: RIGHT SHOULDER ARTHROSCOP, MINI OPEN ROTATOR CUFF TEAR REPAIR,  BICEPS TENODESIS, DISTAL CLAVICLE EXCISION;  Surgeon: Meredith Pel, MD;  Location: [place];  Service: Orthopedics;  Laterality: Right;   TOOTH EXTRACTION N/A 02/22/2021   Procedure: DENTAL RESTORATION/EXTRACTIONS;  Surgeon: Diona Browner, DMD;  Location: [facility] OR;  Service: Oral Surgery;  Laterality: N/A;   TRACHEOSTOMY  02/2017   TUBAL LIGATION     VESICOVAGINAL FISTULA CLOSURE W/ TAH  2009   Social History   Occupational History   Not on file  Tobacco Use   Smoking status: Former    Packs/day: 0.50    Years: 25.00    Total pack years: 12.50    Types: Cigarettes    Quit date: 01/21/2016    Years since quitting: 6.5   Smokeless tobacco: Never  Vaping Use   Vaping Use: Never used  Substance and Sexual Activity   Alcohol use: Yes    Comment: social wine   Drug use: Not Currently    Types: Marijuana    Comment: couple times a month   Sexual activity: Yes    Partners: Male    Birth  control/protection: Surgical    Comment: Hysterectomy

## 2022-07-21 NOTE — Patient Instructions (Addendum)
Plan: Follow-Up Instructions: No follow-ups on file.  Avoid frequent bending and stooping  No lifting greater than 10 lbs. May use ice or moist heat for pain. Weight loss is of benefit. Recommend stopping use of gabapentin and we with try pregablin 50 mg po TID.  Obtain MRI to assess for a localized area of inflamation or infection with Sed rate and CRP.  Vitamin D supplements as delayed healing may cause pain and this can be due to low VItamin D Level.  Exercise is important to improve your indurance and does allow people to function better inspite of back pain.

## 2022-07-28 ENCOUNTER — Encounter: Payer: Medicaid Other | Admitting: Obstetrics and Gynecology

## 2022-07-30 NOTE — Therapy (Deleted)
OUTPATIENT PHYSICAL THERAPY THORACOLUMBAR EVALUATION  Patient Name: [name] MRN: [id number] DOB:1971/05/12, 51 y.o., female Today's Date: 07/30/2022    Past Medical History:  Diagnosis Date   Allergy    Anxiety    Arthritis    Asthma    Chest pain of uncertain etiology 5/88/5027   Chronic back pain    Chronic chest pain    resolved, no problems since 2019 per patient 07/27/19   COPD (chronic obstructive pulmonary disease) ([place])    Depression    DM (diabetes mellitus) ([place])    INSULIN DEPENDENT - TYPE 1   Dyspnea    GERD (gastroesophageal reflux disease)    Headache(784.0)    History of hiatal hernia    Hyperlipidemia    no med, diet controlled   Hypertension    Hypokalemia    Pneumonia    Respiratory disease 05/2017   uses inhalers, neb tx prn, no oxygen   Sleep apnea    does not use CPAP due to trach   Status post tracheostomy ([place]) 03/14/2017   Last Assessment & Plan:  Formatting of this note might be different from the original. - [place] - On tach - ENT following along (please see above for current trach management plan)   Tracheostomy in place [facility]) 02/2017   Past Surgical History:  Procedure Laterality Date   ABDOMINAL HYSTERECTOMY  2009   APPENDECTOMY     BIOPSY  10/15/2021   Procedure: BIOPSY;  Surgeon: Thornton Park, MD;  Location: [place];  Service: Gastroenterology;;   CESAREAN SECTION     x 3   CHOLECYSTECTOMY N/A 03/02/2014   Procedure: LAPAROSCOPIC CHOLECYSTECTOMY;  Surgeon: Joyice Faster. Cornett, MD;  Location: [place];  Service: General;  Laterality: N/A;   ESOPHAGOGASTRODUODENOSCOPY (EGD) WITH PROPOFOL N/A 10/15/2021   Procedure: ESOPHAGOGASTRODUODENOSCOPY (EGD) WITH PROPOFOL;  Surgeon: Thornton Park, MD;  Location: [place];  Service: Gastroenterology;  Laterality: N/A;   HERNIA REPAIR     PANENDOSCOPY N/A 03/04/2017   Procedure: PANENDOSCOPY WITH POSSIBLE FOREIGN BODY REMOVAL;   Surgeon: Jodi Marble, MD;  Location: [place];  Service: ENT;  Laterality: N/A;   ROTATOR CUFF REPAIR Left    ROTATOR CUFF REPAIR Right 07/27/2019   SHOULDER ARTHROSCOPY WITH SUBACROMIAL DECOMPRESSION, ROTATOR CUFF REPAIR AND BICEP TENDON REPAIR Right 07/28/2019   Procedure: RIGHT SHOULDER ARTHROSCOP, MINI OPEN ROTATOR CUFF TEAR REPAIR,  BICEPS TENODESIS, DISTAL CLAVICLE EXCISION;  Surgeon: Meredith Pel, MD;  Location: [place];  Service: Orthopedics;  Laterality: Right;   TOOTH EXTRACTION N/A 02/22/2021   Procedure: DENTAL RESTORATION/EXTRACTIONS;  Surgeon: Diona Browner, DMD;  Location: [facility];  Service: Oral Surgery;  Laterality: N/A;   TRACHEOSTOMY  02/2017   TUBAL LIGATION     VESICOVAGINAL FISTULA CLOSURE W/ TAH  2009   Patient Active Problem List   Diagnosis Date Noted   Abnormal UGI series    Coffee ground emesis 10/15/2021   Gastritis and gastroduodenitis    Coffee ground vomiting 10/14/2021   Fusion of spine of lumbar region 08/20/2021   Spondylolisthesis, lumbar region 08/20/2021    Class: Chronic   Chronic respiratory failure with hypoxia ([place]) 06/20/2021   Preop pulmonary/respiratory exam 06/20/2021   Pain of upper abdomen 11/28/2020   Recurrent vomiting 11/28/2020   COVID-19 09/16/2020   Dyspnea 08/26/2020   Rotator cuff tear 07/28/2019   Insulin dependent type 2 diabetes mellitus ([place]) 04/12/2019   Lactic acidosis 03/22/2019   Chronic right shoulder pain  Community acquired pneumonia 10/25/2018   Asthma, chronic, unspecified asthma severity, with acute exacerbation 10/25/2018   Acute respiratory failure with hypoxia and hypercapnia ([facility]) 10/23/2018   Acute pain of right shoulder due to trauma 10/05/2018   Chronic bilateral low back pain without sciatica 10/05/2018   Dysphonia 10/05/2018   Severe asthma with exacerbation 10/05/2018   Rotator cuff arthropathy, right 06/15/2018   AKI (acute kidney injury) ([place]) 03/06/2018   Carpal tunnel syndrome 01/18/2018    Heart failure ([place]) 05/20/2017   Normocytic anemia 05/06/2017   Tracheostomy dependent ([place]) 03/26/2017   Dysphagia 03/26/2017   Klebsiella cystitis 03/14/2017   Morbid obesity with BMI of 40.0-44.9, adult ([place]) 03/13/2017   Subglottic stenosis 03/06/2017   Asthma 03/06/2017   Chronic diastolic heart failure ([place]) 03/06/2017   Tracheal stenosis 03/04/2017   Acute blood loss anemia    Generalized anxiety disorder    Chronic pain syndrome    GERD (gastroesophageal reflux disease) 02/22/2016   Depression 02/22/2016   Migraine 01/30/2015   Bulging lumbar disc 09/05/2013   Essential hypertension 09/05/2013   Allergic rhinitis, seasonal 08/11/2012   Chronic cough 08/11/2012   OSA (obstructive sleep apnea) 12/04/2011    PCP: Brittney Rakes, MD  REFERRING PROVIDER: Jessy Oto, MD  THERAPY DIAG:  No diagnosis found.  REFERRING DIAG: ***  Rationale for Evaluation and Treatment:  Rehabilitation  SUBJECTIVE:  PERTINENT PAST HISTORY:  L4-5 TLIF, chronic pain syndrome, anxiety, asthma, CHF      PRECAUTIONS: lifting restriction <10 lbs  WEIGHT BEARING RESTRICTIONS No  FALLS:  Has patient fallen in last 6 months? {yes/no:20286}, Number of falls: ***  MOI/History of condition:  Onset date: chronic back pain with PLIF L4-L5 on 12/05  [name] is a 51 y.o. female who presents to clinic with chief complaint of ***.  ***  From referring provider:   "She has an elevated sed rate and CRP but negative MRI of the lumbar spine 11 mo post op L4-5 TLIF with no sign of infection. Likely elevated studies relate to upper respiratory disease. There is no residual stenosis but mild or minimal DDD above and below level of fusion. Vitamin D Deficiency  that is being treated and likely is related to slow healing of the L4-5 fusion. Recommend continued surveillance to ensure the TLIF with go on to heal. She is to enter into a therapy program with  goals of improving her endurance in  Walking and standing. "   Red flags:  {has/denies:26543} {kerredflag:26542}  Pain:  Are you having pain? {yes/no:20286} Pain location: *** NPRS scale:  current {NUMBERS; 0-10:5044}/10  average {NUMBERS; 0-10:5044}/10  Aggravating factors: ***  NPRS, highest: {NUMBERS; 0-10:5044}/10 Relieving factors: ***  NPRS: best: {NUMBERS; 0-10:5044}/10 Pain description: {PAIN DESCRIPTION:21022940} Stage: {Desc; acute/subacute/chronic:13799} Stability: {kerbetterworse:26715} 24 hour pattern: ***   Occupation: ***  Assistive Device: ***  Hand Dominance: ***  Patient Goals/Specific Activities: ***   OBJECTIVE:   DIAGNOSTIC FINDINGS:  MRI LUMBAR SPINE WITHOUT CONTRAST   TECHNIQUE: Multiplanar, multisequence MR imaging of the lumbar spine was performed. No intravenous contrast was administered.   COMPARISON:  CT scan 04/09/2022 and MRI 01/16/2022   FINDINGS: Segmentation: The lowest lumbar type non-rib-bearing vertebra is labeled as L5.   Alignment:  No vertebral subluxation is observed.   Vertebrae: Metal artifact from posterolateral rod and pedicle screw fixation with interbody cage. No significant vertebral marrow edema is identified.   Conus medullaris and cauda equina: Conus extends to the L1 level.  Conus and cauda equina appear normal without substantial nerve root clumping to suggest arachnoiditis.   Paraspinal and other soft tissues: Unremarkable   Disc levels:   T12-L1: Unremarkable.   L1-2: Unremarkable.   L2-3: Unremarkable.   L3-4: No impingement, mild disc bulge.   L4-5: No impingement.  PLIF with right facetectomy.   L5-S1: No impingement. Mild disc bulge. Tapered thecal sac at this level.   IMPRESSION: 1. No impingement is identified in the lumbar spine to explain the patient's symptoms. 2. Prior L4-5 PLIF and right facetectomy. 3. Mild disc bulges at L3-4 and L5-S1.  GENERAL  OBSERVATION/GAIT:  ***  SENSATION:  Light touch: {intact/deficits:24005}  MUSCLE LENGTH: Hamstrings: Right {kerminsig:27227} restriction; Left {kerminsig:27227} restriction ASLR: Right {keraslr:27228}; Left {keraslr:27228} Marcello Moores test: Right {kerminsig:27227} restriction; Left {kerminsig:27227} restriction Ely's test: Right {kerminsig:27227} restriction; Left {kerminsig:27227} restriction   LUMBAR AROM  AROM AROM  07/30/2022  Flexion {kerromlxflex:28296}  Extension {kerromcxlx:26716}  Right lateral flexion {kerromcxlx:26716}  Left lateral flexion {kerromcxlx:26716}  Right rotation {kerromcxlx:26716}  Left rotation {kerromcxlx:26716}    (Blank rows = not tested)    LUMBAR SPECIAL TESTS:  Straight leg raise: L (***), R (***) Slump: L (***), R (***)  LE MMT:  MMT Right 07/30/2022 Left 07/30/2022  Hip flexion (L2, L3) *** ***  Knee extension (L3) *** ***  Knee flexion *** ***  Hip abduction *** ***  Hip extension *** ***  Hip external rotation    Hip internal rotation    Hip adduction    Ankle dorsiflexion (L4)    Ankle plantarflexion (S1)    Ankle inversion    Ankle eversion    Great Toe ext (L5)    Grossly     (Blank rows = not tested, score listed is out of 5 possible points.  N = WNL, D = diminished, C = clear for gross weakness with myotome testing, * = concordant pain with testing)   LE ROM:  ROM Right 07/30/2022 Left 07/30/2022  Hip flexion    Hip extension    Hip abduction    Hip adduction    Hip internal rotation    Hip external rotation    Knee flexion    Knee extension    Ankle dorsiflexion    Ankle plantarflexion    Ankle inversion    Ankle eversion      (Blank rows = not tested, N = WNL, * = concordant pain with testing)  Functional Tests  Eval (07/30/2022)    30'' STS: ***x  UE used? ***     Progressive balance screen (highest level completed for >/= 10''):  Feet together: ***'' Semi Tandem: R in rear ***'', L in rear  ***'' Tandem: R in rear ***'', L in rear ***'' SLS: R ***'', L ***''      10 m max gait speed: ***'', *** m/s, AD: ***                                                    PALPATION:   ***  PATIENT SURVEYS:  {rehab surveys:24030}   TODAY'S TREATMENT  ***  PATIENT EDUCATION:  POC, diagnosis, prognosis, HEP, and outcome measures.  Pt educated via explanation, demonstration, and handout (HEP).  Pt confirms understanding verbally.   HOME EXERCISE PROGRAM: ***  ASTERISK SIGNS   [place] Signs Eval (07/30/2022)  ASSESSMENT:  CLINICAL IMPRESSION: [name] is a 51 y.o. female who presents to clinic with signs and sxs consistent with ***.    OBJECTIVE IMPAIRMENTS: Pain, ***  ACTIVITY LIMITATIONS: ***  PERSONAL FACTORS: See medical history and pertinent history   REHAB POTENTIAL: {rehabpotential:25112}  CLINICAL DECISION MAKING: {clinical decision making:25114}  EVALUATION COMPLEXITY: {Evaluation complexity:25115}   GOALS:   SHORT TERM GOALS: Target date: 08/27/2022  [name] will be >75% HEP compliant to improve carryover between sessions and facilitate independent management of condition  Evaluation (07/30/2022): ongoing Goal status: INITIAL   LONG TERM GOALS: Target date: 09/24/2022  [name] will improve FOTO score to *** as a proxy for functional improvement  Evaluation/Baseline (07/30/2022): *** Goal status: INITIAL   2.  [name] will self report >/= 50% decrease in pain from evaluation   Evaluation/Baseline (07/30/2022): ***/10 max pain Goal status: INITIAL   3.  [name] will improve 30'' STS (MCID 2) to >/= ***x (w/ UE?: ***) to show improved LE strength and improved transfers   Evaluation/Baseline (07/30/2022): ***x  w/ UE? *** Goal status: INITIAL   4.  [name] will improve 10 meter max gait speed to *** m/s (.1 m/s MCID) to show functional improvement in ambulation   Evaluation/Baseline (07/30/2022): ***  m/s Goal status: INITIAL   [place]:     5.  ***   6.  ***   PLAN: PT FREQUENCY: 1-2x/week  PT DURATION: 8 weeks (Ending 09/24/2022)  PLANNED INTERVENTIONS: Therapeutic exercises, Aquatic therapy, Therapeutic activity, Neuro Muscular re-education, Gait training, Patient/Family education, Joint mobilization, Dry Needling, Electrical stimulation, Spinal mobilization and/or manipulation, Moist heat, Taping, Vasopneumatic device, Ionotophoresis '4mg'$ /ml Dexamethasone, and Manual therapy  PLAN FOR NEXT SESSION: ***   Shearon Balo PT, DPT 07/30/2022, 2:40 PM

## 2022-07-31 ENCOUNTER — Ambulatory Visit: Payer: Medicare Other | Admitting: Physical Therapy

## 2022-08-05 ENCOUNTER — Encounter: Payer: Self-pay | Admitting: Family Medicine

## 2022-08-06 DIAGNOSIS — J9610 Chronic respiratory failure, unspecified whether with hypoxia or hypercapnia: Secondary | ICD-10-CM | POA: Diagnosis not present

## 2022-08-06 DIAGNOSIS — J449 Chronic obstructive pulmonary disease, unspecified: Secondary | ICD-10-CM | POA: Diagnosis not present

## 2022-08-06 DIAGNOSIS — J45909 Unspecified asthma, uncomplicated: Secondary | ICD-10-CM | POA: Diagnosis not present

## 2022-08-06 DIAGNOSIS — G4733 Obstructive sleep apnea (adult) (pediatric): Secondary | ICD-10-CM | POA: Diagnosis not present

## 2022-08-08 DIAGNOSIS — J449 Chronic obstructive pulmonary disease, unspecified: Secondary | ICD-10-CM | POA: Diagnosis not present

## 2022-08-08 DIAGNOSIS — J9610 Chronic respiratory failure, unspecified whether with hypoxia or hypercapnia: Secondary | ICD-10-CM | POA: Diagnosis not present

## 2022-08-08 DIAGNOSIS — G4733 Obstructive sleep apnea (adult) (pediatric): Secondary | ICD-10-CM | POA: Diagnosis not present

## 2022-08-08 DIAGNOSIS — J45909 Unspecified asthma, uncomplicated: Secondary | ICD-10-CM | POA: Diagnosis not present

## 2022-08-08 DIAGNOSIS — Z930 Tracheostomy status: Secondary | ICD-10-CM | POA: Diagnosis not present

## 2022-08-11 DIAGNOSIS — Z930 Tracheostomy status: Secondary | ICD-10-CM | POA: Diagnosis not present

## 2022-08-11 DIAGNOSIS — E119 Type 2 diabetes mellitus without complications: Secondary | ICD-10-CM | POA: Diagnosis not present

## 2022-08-11 DIAGNOSIS — R030 Elevated blood-pressure reading, without diagnosis of hypertension: Secondary | ICD-10-CM | POA: Diagnosis not present

## 2022-08-11 DIAGNOSIS — H9201 Otalgia, right ear: Secondary | ICD-10-CM | POA: Diagnosis not present

## 2022-08-11 DIAGNOSIS — M5416 Radiculopathy, lumbar region: Secondary | ICD-10-CM | POA: Diagnosis not present

## 2022-08-11 DIAGNOSIS — K0889 Other specified disorders of teeth and supporting structures: Secondary | ICD-10-CM | POA: Diagnosis not present

## 2022-08-11 DIAGNOSIS — G8929 Other chronic pain: Secondary | ICD-10-CM | POA: Diagnosis not present

## 2022-08-11 DIAGNOSIS — Z79899 Other long term (current) drug therapy: Secondary | ICD-10-CM | POA: Diagnosis not present

## 2022-08-11 DIAGNOSIS — M545 Low back pain, unspecified: Secondary | ICD-10-CM | POA: Diagnosis not present

## 2022-08-16 ENCOUNTER — Emergency Department (HOSPITAL_COMMUNITY): Payer: Medicare Other

## 2022-08-16 ENCOUNTER — Emergency Department (HOSPITAL_COMMUNITY)
Admission: EM | Admit: 2022-08-16 | Discharge: 2022-08-16 | Disposition: A | Discharge status: Home / Self Care | Payer: Medicare Other | Source: Home / Self Care | Attending: Emergency Medicine | Admitting: Emergency Medicine

## 2022-08-16 ENCOUNTER — Other Ambulatory Visit: Payer: Self-pay

## 2022-08-16 DIAGNOSIS — I110 Hypertensive heart disease with heart failure: Secondary | ICD-10-CM | POA: Insufficient documentation

## 2022-08-16 DIAGNOSIS — E1165 Type 2 diabetes mellitus with hyperglycemia: Secondary | ICD-10-CM | POA: Diagnosis not present

## 2022-08-16 DIAGNOSIS — Z8616 Personal history of COVID-19: Secondary | ICD-10-CM | POA: Insufficient documentation

## 2022-08-16 DIAGNOSIS — I509 Heart failure, unspecified: Secondary | ICD-10-CM | POA: Insufficient documentation

## 2022-08-16 DIAGNOSIS — Z794 Long term (current) use of insulin: Secondary | ICD-10-CM | POA: Insufficient documentation

## 2022-08-16 DIAGNOSIS — E1065 Type 1 diabetes mellitus with hyperglycemia: Secondary | ICD-10-CM | POA: Insufficient documentation

## 2022-08-16 DIAGNOSIS — E785 Hyperlipidemia, unspecified: Secondary | ICD-10-CM | POA: Diagnosis not present

## 2022-08-16 DIAGNOSIS — G43909 Migraine, unspecified, not intractable, without status migrainosus: Secondary | ICD-10-CM | POA: Diagnosis not present

## 2022-08-16 DIAGNOSIS — R1110 Vomiting, unspecified: Secondary | ICD-10-CM | POA: Diagnosis not present

## 2022-08-16 DIAGNOSIS — R0602 Shortness of breath: Secondary | ICD-10-CM | POA: Diagnosis not present

## 2022-08-16 DIAGNOSIS — F330 Major depressive disorder, recurrent, mild: Secondary | ICD-10-CM | POA: Diagnosis not present

## 2022-08-16 DIAGNOSIS — G894 Chronic pain syndrome: Secondary | ICD-10-CM | POA: Diagnosis not present

## 2022-08-16 DIAGNOSIS — I10 Essential (primary) hypertension: Secondary | ICD-10-CM | POA: Diagnosis not present

## 2022-08-16 DIAGNOSIS — K2100 Gastro-esophageal reflux disease with esophagitis, without bleeding: Secondary | ICD-10-CM | POA: Diagnosis not present

## 2022-08-16 DIAGNOSIS — Z87891 Personal history of nicotine dependence: Secondary | ICD-10-CM | POA: Diagnosis not present

## 2022-08-16 DIAGNOSIS — E1142 Type 2 diabetes mellitus with diabetic polyneuropathy: Secondary | ICD-10-CM | POA: Diagnosis not present

## 2022-08-16 DIAGNOSIS — K219 Gastro-esophageal reflux disease without esophagitis: Secondary | ICD-10-CM | POA: Insufficient documentation

## 2022-08-16 DIAGNOSIS — J386 Stenosis of larynx: Secondary | ICD-10-CM | POA: Diagnosis not present

## 2022-08-16 DIAGNOSIS — J449 Chronic obstructive pulmonary disease, unspecified: Secondary | ICD-10-CM | POA: Insufficient documentation

## 2022-08-16 DIAGNOSIS — F32A Depression, unspecified: Secondary | ICD-10-CM | POA: Diagnosis not present

## 2022-08-16 DIAGNOSIS — G43119 Migraine with aura, intractable, without status migrainosus: Secondary | ICD-10-CM | POA: Diagnosis not present

## 2022-08-16 DIAGNOSIS — Z8249 Family history of ischemic heart disease and other diseases of the circulatory system: Secondary | ICD-10-CM | POA: Diagnosis not present

## 2022-08-16 DIAGNOSIS — Z7982 Long term (current) use of aspirin: Secondary | ICD-10-CM | POA: Insufficient documentation

## 2022-08-16 DIAGNOSIS — J45909 Unspecified asthma, uncomplicated: Secondary | ICD-10-CM | POA: Insufficient documentation

## 2022-08-16 DIAGNOSIS — R1010 Upper abdominal pain, unspecified: Secondary | ICD-10-CM

## 2022-08-16 DIAGNOSIS — J4540 Moderate persistent asthma, uncomplicated: Secondary | ICD-10-CM | POA: Diagnosis not present

## 2022-08-16 DIAGNOSIS — N179 Acute kidney failure, unspecified: Secondary | ICD-10-CM | POA: Diagnosis not present

## 2022-08-16 DIAGNOSIS — J0410 Acute tracheitis without obstruction: Secondary | ICD-10-CM | POA: Diagnosis not present

## 2022-08-16 DIAGNOSIS — Z79899 Other long term (current) drug therapy: Secondary | ICD-10-CM | POA: Diagnosis not present

## 2022-08-16 DIAGNOSIS — R079 Chest pain, unspecified: Secondary | ICD-10-CM | POA: Diagnosis not present

## 2022-08-16 DIAGNOSIS — J9501 Hemorrhage from tracheostomy stoma: Secondary | ICD-10-CM | POA: Diagnosis not present

## 2022-08-16 DIAGNOSIS — G4733 Obstructive sleep apnea (adult) (pediatric): Secondary | ICD-10-CM | POA: Diagnosis not present

## 2022-08-16 DIAGNOSIS — M549 Dorsalgia, unspecified: Secondary | ICD-10-CM | POA: Diagnosis not present

## 2022-08-16 DIAGNOSIS — R1012 Left upper quadrant pain: Secondary | ICD-10-CM | POA: Diagnosis not present

## 2022-08-16 DIAGNOSIS — I5032 Chronic diastolic (congestive) heart failure: Secondary | ICD-10-CM | POA: Diagnosis not present

## 2022-08-16 DIAGNOSIS — J9611 Chronic respiratory failure with hypoxia: Secondary | ICD-10-CM | POA: Diagnosis not present

## 2022-08-16 DIAGNOSIS — R042 Hemoptysis: Secondary | ICD-10-CM | POA: Diagnosis not present

## 2022-08-16 DIAGNOSIS — F411 Generalized anxiety disorder: Secondary | ICD-10-CM | POA: Diagnosis not present

## 2022-08-16 DIAGNOSIS — R0789 Other chest pain: Secondary | ICD-10-CM | POA: Diagnosis not present

## 2022-08-16 DIAGNOSIS — E119 Type 2 diabetes mellitus without complications: Secondary | ICD-10-CM | POA: Diagnosis not present

## 2022-08-16 LAB — BASIC METABOLIC PANEL
Anion gap: 17 — ABNORMAL HIGH (ref 5–15)
BUN: 12 mg/dL (ref 6–20)
CO2: 21 mmol/L — ABNORMAL LOW (ref 22–32)
Calcium: 9.8 mg/dL (ref 8.9–10.3)
Chloride: 99 mmol/L (ref 98–111)
Creatinine, Ser: 0.96 mg/dL (ref 0.44–1.00)
GFR, Estimated: >60 mL/min (ref >60)
Glucose, Bld: 209 mg/dL — ABNORMAL HIGH (ref 70–99)
Potassium: 3.8 mmol/L (ref 3.5–5.1)
Sodium: 137 mmol/L (ref 135–145)

## 2022-08-16 LAB — HEPATIC FUNCTION PANEL
ALT: 31 U/L (ref 0–44)
AST: 29 U/L (ref 15–41)
Albumin: 3.8 g/dL (ref 3.5–5.0)
Alkaline Phosphatase: 104 U/L (ref 38–126)
Bilirubin, Direct: 0.1 mg/dL (ref 0.0–0.2)
Indirect Bilirubin: 0.3 mg/dL (ref 0.3–0.9)
Total Bilirubin: 0.4 mg/dL (ref 0.3–1.2)
Total Protein: 8 g/dL (ref 6.5–8.1)

## 2022-08-16 LAB — CBC
HCT: 38.4 % (ref 36.0–46.0)
Hemoglobin: 12.5 g/dL (ref 12.0–15.0)
MCH: 28.3 pg (ref 26.0–34.0)
MCHC: 32.6 g/dL (ref 30.0–36.0)
MCV: 86.9 fL (ref 80.0–100.0)
Platelets: 405 10*3/uL — ABNORMAL HIGH (ref 150–400)
RBC: 4.42 MIL/uL (ref 3.87–5.11)
RDW: 14.5 % (ref 11.5–15.5)
WBC: 10.5 10*3/uL (ref 4.0–10.5)
nRBC: 0.2 % (ref 0.0–0.2)

## 2022-08-16 LAB — I-STAT BETA HCG BLOOD, ED (MC, WL, AP ONLY): I-stat hCG, quantitative: <5 m[IU]/mL (ref <5)

## 2022-08-16 LAB — LIPASE, BLOOD: Lipase: 35 U/L (ref 11–51)

## 2022-08-16 LAB — TROPONIN I (HIGH SENSITIVITY)
Troponin I (High Sensitivity): 8 ng/L (ref <18)
Troponin I (High Sensitivity): 7 ng/L (ref <18)

## 2022-08-16 IMAGING — MR MR LUMBAR SPINE WO/W CM
4 of 7 series · 20 of 48 positions shown · IV contrast (multihance)
Comparison: Lumbar radiographs 01/02/2022, lumbar spine MRI
12/09/2020

CLINICAL DATA: Low back pain with new symptoms. History of lumbar
fusion at L4-L5 with persistent right leg pain and weakness

EXAM:
MRI LUMBAR SPINE WITHOUT AND WITH CONTRAST
TECHNIQUE: Multiplanar and multiecho pulse sequences of the lumbar spine were
obtained without and with intravenous contrast.
CONTRAST:  20mL MULTIHANCE GADOBENATE DIMEGLUMINE 529 MG/ML IV SOLN

[Series 5: T2 · sagittal · 4.0mm · 0.73mm/px · 5 of 15 slices shown (1 of 2)]
[im 1/15]
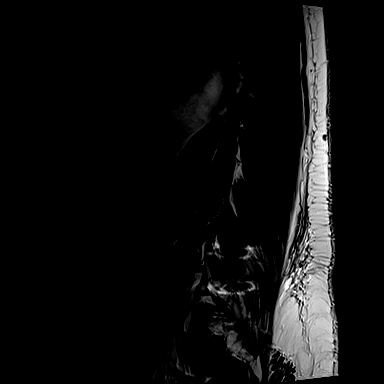
[im 4/15]
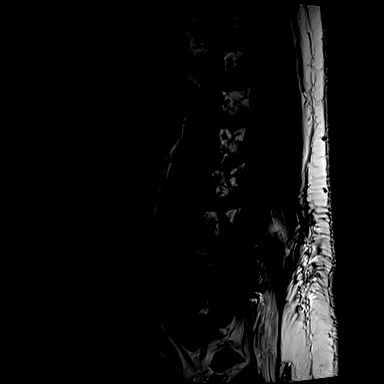
[im 8/15]
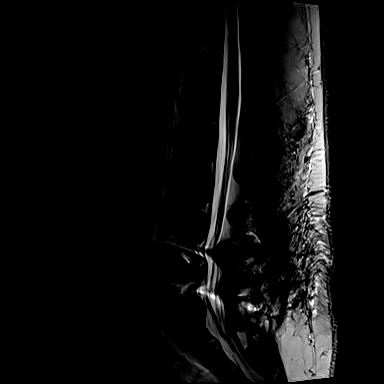
[im 11/15]
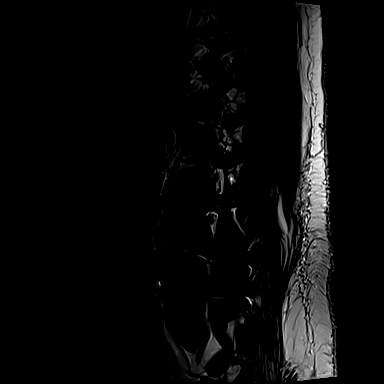
[im 15/15]
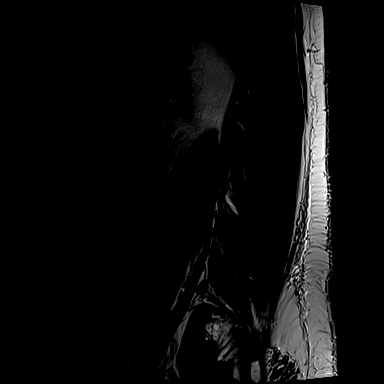

[Series 7: T1 · sagittal · 4.0mm · 0.73mm/px · 4 of 15 slices shown (1 of 2)]
[im 1/15]
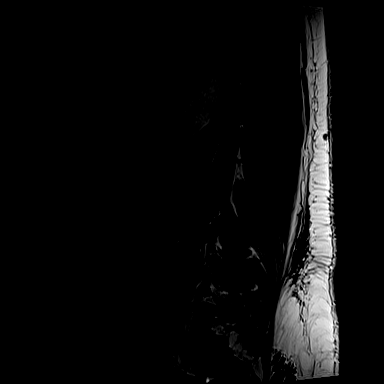
[im 5/15]
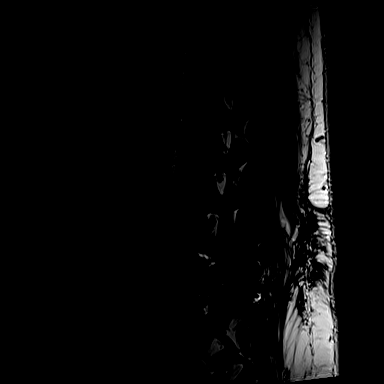
[im 10/15]
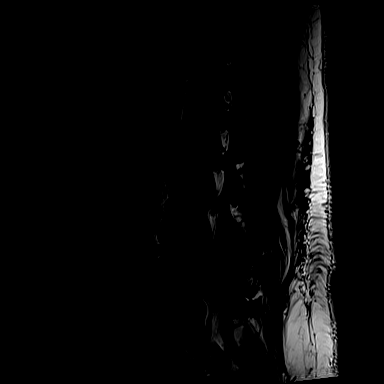
[im 15/15]
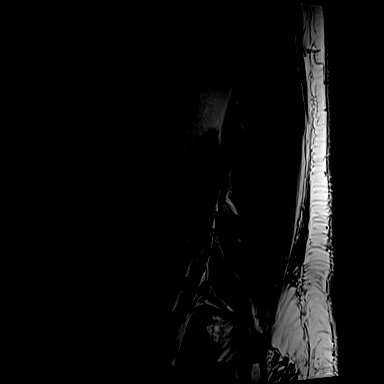

[Series 10: T1 · axial · 4.0mm · 0.28mm/px · z∈[+14,+162]mm · 3 of 38 slices shown (2 of 2)]
[im 5/38]
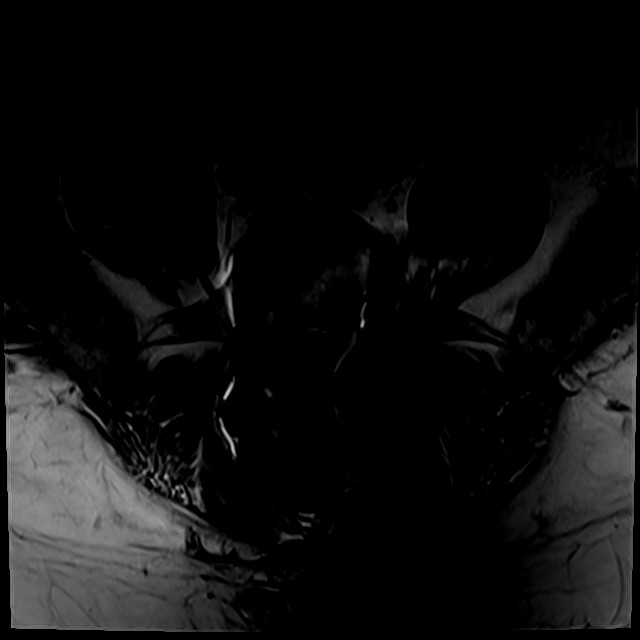
[im 21/38]
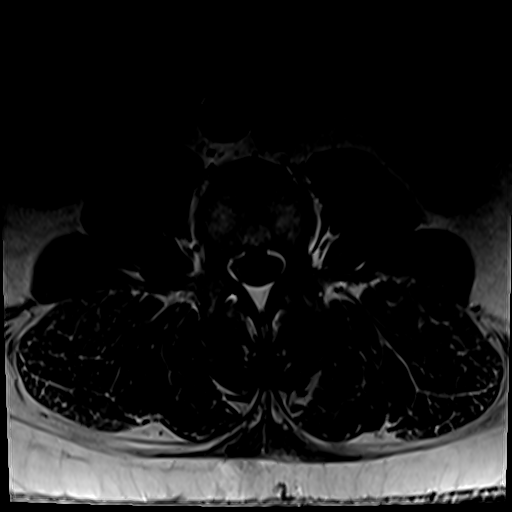
[im 33/38]
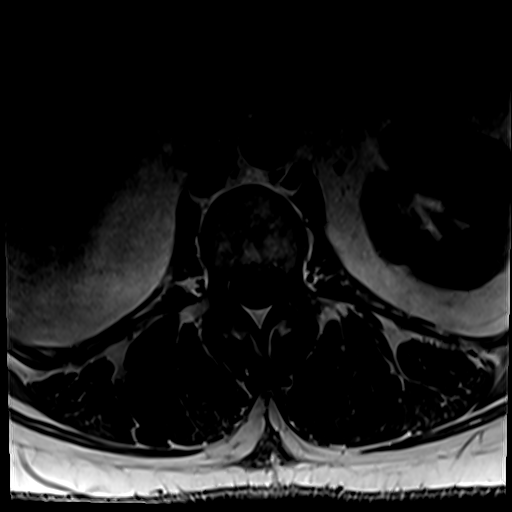

[Series 13: T2 · axial · 4.0mm · 0.28mm/px · z∈[-6,+187]mm · 8 of 38 slices shown (2 of 2)]
[im 1/38]
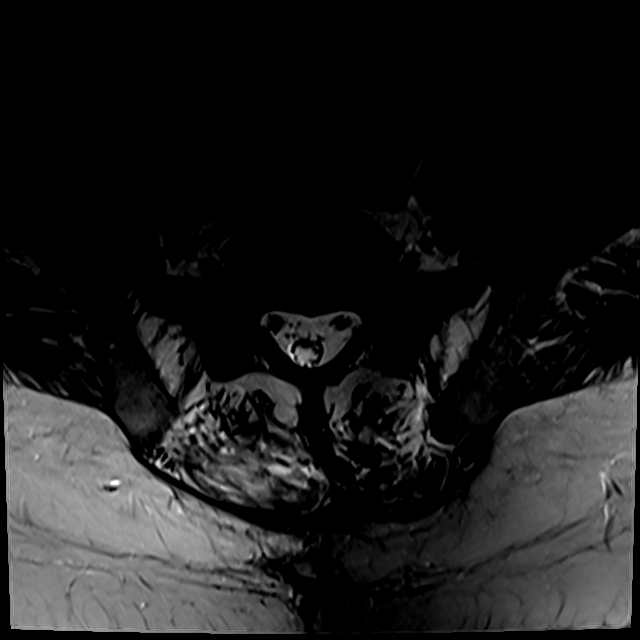
[im 5/38]
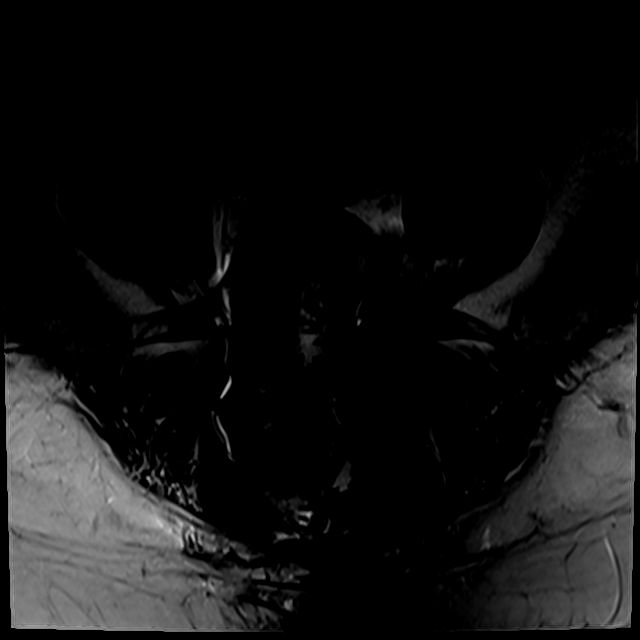
[im 13/38]
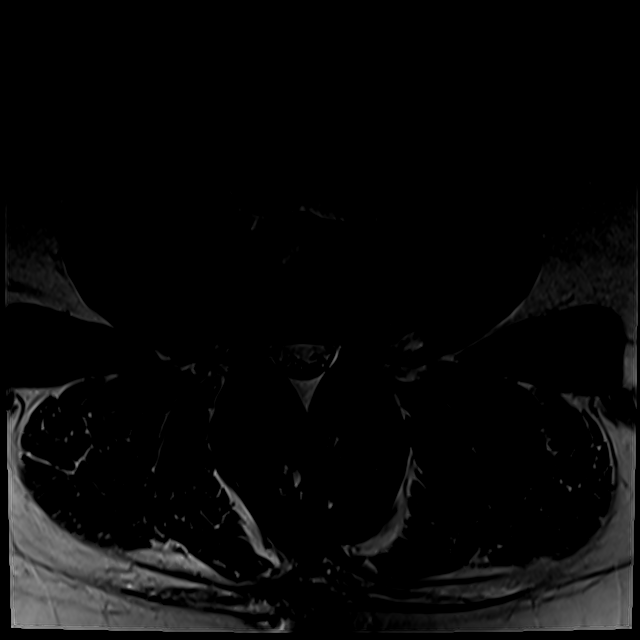
[im 17/38]
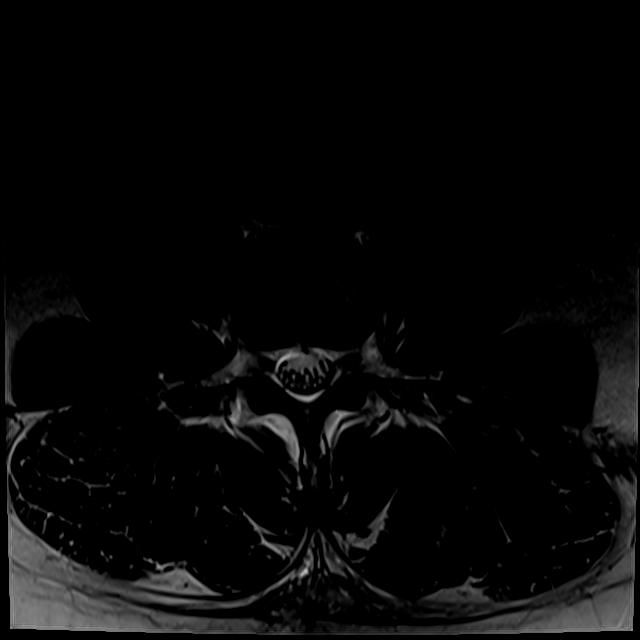
[im 21/38]
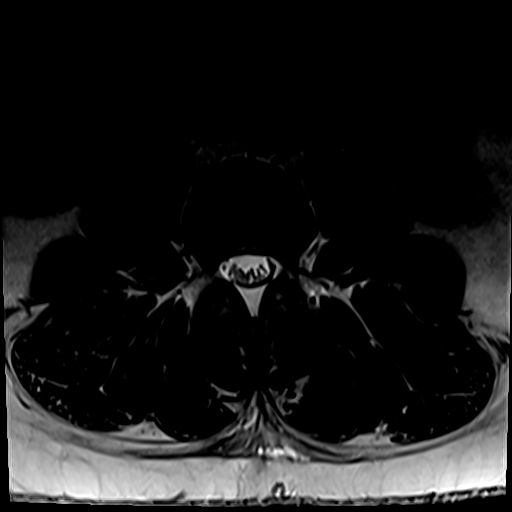
[im 25/38]
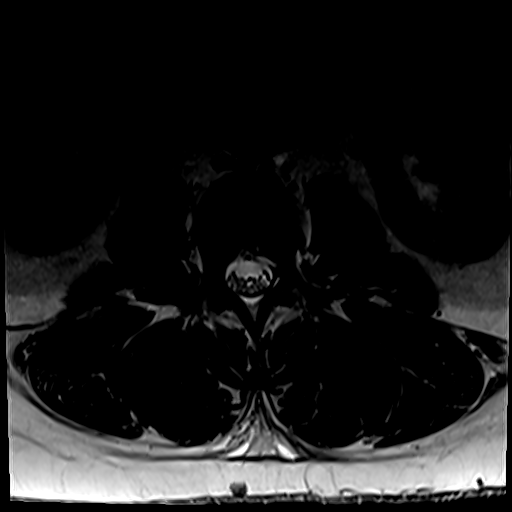
[im 33/38]
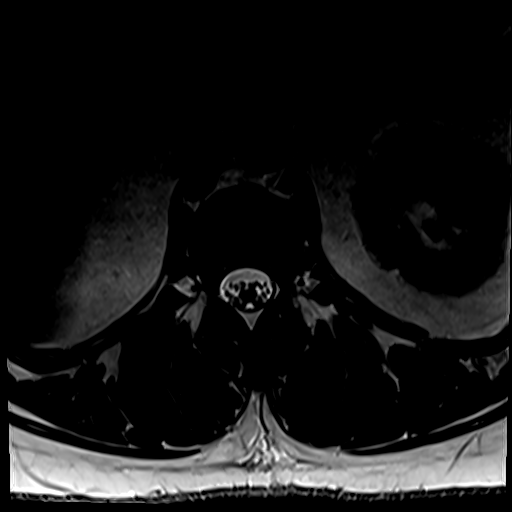
[im 38/38]
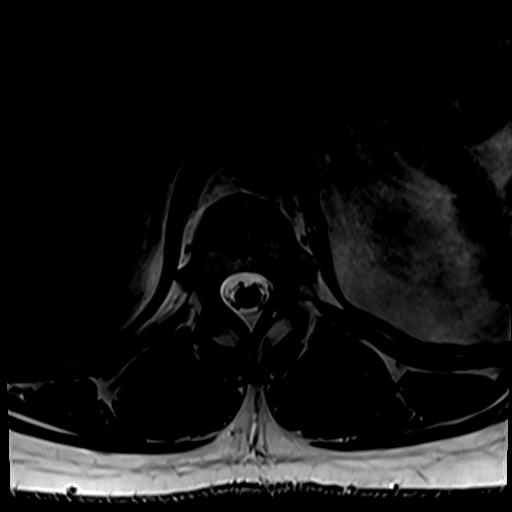

[20 of 48 positions shown; findings below may reference images not displayed]

FINDINGS: Segmentation: Standard; the lowest formed disc space is designated
L5-S1.

Alignment: There is straightening of the normal lumbar lordosis.
There is no antero or retrolisthesis.

Vertebrae: There has been interval posterior instrumented fusion at
L4-L5 with suspected right laminectomy. There is no evidence of
complication. Marrow signal is otherwise normal. There is no
suspicious marrow signal abnormality or marrow edema. There is no
abnormal marrow enhancement.

Conus medullaris and cauda equina: Conus extends to the L1 level.
Conus and cauda equina appear normal. There is no abnormal
enhancement of the cauda equina nerve roots.

Paraspinal and other soft tissues: Postsurgical changes are noted in
the soft tissues posterior to the surgical levels. The paraspinal
soft tissues are otherwise unremarkable.

Disc levels:

The disc heights at the nonsurgical levels are preserved.

T12-L1: No significant spinal canal or neural foraminal stenosis

L1-L2: No significant spinal canal or neural foraminal stenosis

L2-L3: No significant spinal canal or neural foraminal stenosis

L3-L4: There is a minimal disc bulge without significant spinal
canal or neural foraminal stenosis

L4-L5: Status post posterior instrumented fusion and suspected right
laminectomy. The previously seen disc protrusion has resolved
following surgery. There is residual mild narrowing of the left
subarticular zone without evidence of frank nerve root impingement.
There is no residual spinal canal or neural foraminal stenosis.
There is no convincing evidence of nerve root impingement or
significant neural foraminal stenosis.

L5-S1: No significant spinal canal or neural foraminal stenosis.
IMPRESSION: 1. Status post posterior instrumented fusion and suspected right
laminectomy at L4-L5. The previously seen disc protrusion on the
study from 12/19/2020 is no longer present, and previously seen
spinal canal stenosis has significantly improved. There is residual
mild narrowing of the left subarticular zone without evidence of
frank nerve root impingement. No other convincing evidence of nerve
root impingement to explain the patient's right-sided symptoms.
2. No significant spinal canal or neural foraminal stenosis at the
other levels.

## 2022-08-16 MED ORDER — ONDANSETRON 4 MG PO TBDP
4.0000 mg | ORAL_TABLET | Freq: Once | ORAL | Status: AC | PRN
  Administered 2022-08-16: 4 mg via ORAL
  Filled 2022-08-16: qty 1

## 2022-08-16 MED ORDER — FENTANYL CITRATE PF 50 MCG/ML IJ SOSY
100.0000 ug | PREFILLED_SYRINGE | Freq: Once | INTRAMUSCULAR | Status: DC
  Filled 2022-08-16: qty 2

## 2022-08-16 MED ORDER — FENTANYL CITRATE PF 50 MCG/ML IJ SOSY
75.0000 ug | PREFILLED_SYRINGE | Freq: Once | INTRAMUSCULAR | Status: AC
  Administered 2022-08-16: 75 ug via INTRAVENOUS

## 2022-08-16 MED ORDER — IOHEXOL 350 MG/ML SOLN
100.0000 mL | Freq: Once | INTRAVENOUS | Status: AC | PRN
  Administered 2022-08-16: 100 mL via INTRAVENOUS

## 2022-08-16 MED ORDER — PROCHLORPERAZINE EDISYLATE 10 MG/2ML IJ SOLN
10.0000 mg | Freq: Once | INTRAMUSCULAR | Status: AC
  Administered 2022-08-16: 10 mg via INTRAVENOUS
  Filled 2022-08-16: qty 2

## 2022-08-16 MED ORDER — PANTOPRAZOLE SODIUM 40 MG IV SOLR
40.0000 mg | Freq: Once | INTRAVENOUS | Status: AC
  Administered 2022-08-16: 40 mg via INTRAVENOUS
  Filled 2022-08-16: qty 10

## 2022-08-16 NOTE — ED Notes (Signed)
Patient taken to CT scan in stretcher

## 2022-08-16 NOTE — ED Triage Notes (Signed)
Pt presents, chronic trach in place. Pt has had vomiting and severe CP x 2 days.  Pt is vomiting at time of triage.

## 2022-08-16 NOTE — ED Provider Notes (Signed)
[place] EMERGENCY DEPARTMENT Provider Note  CSN: [id number] Arrival date & time: 08/16/22 0012  Chief Complaint(s) Chest Pain and Emesis  [name] is a 51 y.o. female with a past medical history listed below including asthma/COPD that required trach placement, insulin-dependent diabetes, gastritis and esophagitis here for 3 days of epigastric abdominal pain and chest pain described as sharp stabbing radiating up to her breasts.  This is similar to prior episodes of esophagitis/gastritis.  Patient reports that she has been having nausea and vomiting during that time.  Reports that she was not able to eat for things given.  Pain worse with eating and p.o. intake.  She denies any shortness of breath.  No diarrhea.  No other physical complaints.  The history is provided by the patient.    Past Medical History Past Medical History:  Diagnosis Date   Allergy    Anxiety    Arthritis    Asthma    Chest pain of uncertain etiology 3/76/2831   Chronic back pain    Chronic chest pain    resolved, no problems since 2019 per patient 07/27/19   COPD (chronic obstructive pulmonary disease) ([place])    Depression    DM (diabetes mellitus) ([place])    INSULIN DEPENDENT - TYPE 1   Dyspnea    GERD (gastroesophageal reflux disease)    Headache(784.0)    History of hiatal hernia    Hyperlipidemia    no med, diet controlled   Hypertension    Hypokalemia    Pneumonia    Respiratory disease 05/2017   uses inhalers, neb tx prn, no oxygen   Sleep apnea    does not use CPAP due to trach   Status post tracheostomy ([place]) 03/14/2017   Last Assessment & Plan:  Formatting of this note might be different from the original. - [place] - On tach - ENT following along (please see above for current trach management plan)   Tracheostomy in place [facility]) 02/2017   Patient Active Problem List   Diagnosis Date Noted   Abnormal UGI series     Coffee ground emesis 10/15/2021   Gastritis and gastroduodenitis    Coffee ground vomiting 10/14/2021   Fusion of spine of lumbar region 08/20/2021   Spondylolisthesis, lumbar region 08/20/2021    Class: Chronic   Chronic respiratory failure with hypoxia ([place]) 06/20/2021   Preop pulmonary/respiratory exam 06/20/2021   Pain of upper abdomen 11/28/2020   Recurrent vomiting 11/28/2020   COVID-19 09/16/2020   Dyspnea 08/26/2020   Rotator cuff tear 07/28/2019   Insulin dependent type 2 diabetes mellitus ([place]) 04/12/2019   Lactic acidosis 03/22/2019   Chronic right shoulder pain    Community acquired pneumonia 10/25/2018   Asthma, chronic, unspecified asthma severity, with acute exacerbation 10/25/2018   Acute respiratory failure with hypoxia and hypercapnia ([place]) 10/23/2018   Acute pain of right shoulder due to trauma 10/05/2018   Chronic bilateral low back pain without sciatica 10/05/2018   Dysphonia 10/05/2018   Severe asthma with exacerbation 10/05/2018   Rotator cuff arthropathy, right 06/15/2018   AKI (acute kidney injury) ([place]) 03/06/2018   Carpal tunnel syndrome 01/18/2018   Heart failure ([place]) 05/20/2017   Normocytic anemia 05/06/2017   Tracheostomy dependent ([place]) 03/26/2017   Dysphagia 03/26/2017   Klebsiella cystitis 03/14/2017   Morbid obesity with BMI of 40.0-44.9, adult ([place]) 03/13/2017   Subglottic stenosis 03/06/2017   Asthma 03/06/2017   Chronic diastolic heart failure (  [place]) 03/06/2017   Tracheal stenosis 03/04/2017   Acute blood loss anemia    Generalized anxiety disorder    Chronic pain syndrome    GERD (gastroesophageal reflux disease) 02/22/2016   Depression 02/22/2016   Migraine 01/30/2015   Bulging lumbar disc 09/05/2013   Essential hypertension 09/05/2013   Allergic rhinitis, seasonal 08/11/2012   Chronic cough 08/11/2012   OSA (obstructive sleep apnea) 12/04/2011   Home Medication(s) Prior to Admission medications   Medication Sig Start Date  End Date Taking? Authorizing Provider  albuterol (PROAIR HFA) 108 (90 Base) MCG/ACT inhaler Inhale 2 puffs into the lungs every 6 (six) hours as needed for wheezing or shortness of breath. 09/24/21   [name], MD  albuterol (PROVENTIL) (2.5 MG/3ML) 0.083% nebulizer solution Take 3 mLs (2.5 mg total) by nebulization every 6 (six) hours as needed for wheezing or shortness of breath. 09/24/21   [name], MD  amitriptyline (ELAVIL) 10 MG tablet Take 1 tablet (10 mg total) by mouth at bedtime. 07/09/22   [name], MD  aspirin EC 81 MG EC tablet Take 1 tablet (81 mg total) by mouth daily. Swallow whole. 08/27/21   Jessy Oto, MD  benzonatate (TESSALON) 100 MG capsule Take 1 capsule (100 mg total) by mouth 2 (two) times daily as needed for cough. 07/09/22   [name], MD  Blood Glucose Monitoring Suppl (ONETOUCH VERIO) w/Device KIT 1 each by Does not apply route daily. Use to monitor glucose levels daily; E11.9 06/20/22   Elayne Snare, MD  buPROPion [facility] SR) 150 MG 12 hr tablet Take 1 tablet (150 mg total) by mouth 2 (two) times daily. 04/08/22   [name], MD  cetirizine (ZYRTEC) 10 MG tablet TAKE 1 TABLET BY MOUTH AT BEDTIME 12/10/21   Spero Geralds, MD  Continuous Blood Gluc Receiver (FREESTYLE LIBRE 2 READER) DEVI Use to check blood sugar three times daily. E11.9 07/09/22   [name], MD  Continuous Blood Gluc Sensor (FREESTYLE LIBRE 2 SENSOR) MISC Use to check blood sugar three times daily. Change sensor once every 14 days. E11.9 07/09/22   [name], MD  dapagliflozin propanediol (FARXIGA) 10 MG TABS tablet Take 1 tablet (10 mg total) by mouth daily before breakfast. 07/09/22   [name], MD  diclofenac Sodium (VOLTAREN) 1 % GEL Apply 4 g topically 4 (four) times daily. 11/27/20   [name], MD  furosemide (LASIX) 20 MG tablet Take 1 tablet (20 mg total) by mouth daily. 07/09/22   [name], MD  glucose blood (ONETOUCH VERIO) test strip  Use to check glucose levels TID. 06/20/22   [name], MD  hydrOXYzine (ATARAX) 25 MG tablet Take 1 tablet (25 mg total) by mouth 3 (three) times daily as needed. for anxiety 04/08/22   [name], MD  insulin degludec (TRESIBA FLEXTOUCH) 200 UNIT/ML FlexTouch Pen Inject 160 Units into the skin daily. 07/09/22   [name], MD  Insulin Pen Needle 31G X 8 MM MISC Inject 1 Units as directed daily. 07/30/18   [provider]  lisinopril (ZESTRIL) 20 MG tablet Take 1 tablet (20 mg total) by mouth daily. 04/08/22   [name], MD  melatonin 3 MG TABS tablet Take 1 tablet (3 mg total) by mouth at bedtime. 06/20/22   [name], MD  Misc. Devices MISC Nebulizer device. Diagnosis - Asthma 07/16/20   [name], MD  Misc. Devices [place] chair 04/03/21   [name], MD  Misc. Devices MISC Bedside commode. Diagnosis-  low back pain 09/02/21   [name], MD  Misc. Devices [employer].  Diagnosis-low back pain 06/20/22   [name], MD  Misc. Devices [employer].  Diagnosis-low back pain 06/20/22   [name], MD  mometasone-formoterol (DULERA) 200-5 MCG/ACT AERO Inhale 2 puffs into the lungs in the morning and at bedtime. 07/09/22   [name], MD  montelukast (SINGULAIR) 10 MG tablet Take 1 tablet (10 mg total) by mouth at bedtime. 07/09/22   [name], MD  ondansetron (ZOFRAN) 4 MG tablet Take 1 tablet (4 mg total) by mouth every 8 (eight) hours as needed for nausea or vomiting. 07/07/22   [name], MD  OneTouch Delica Lancets 91Y MISC 1 each by Does not apply route daily. Use to monitor glucose levels daily; E11.9 04/14/19   Renato Shin, MD  oxyCODONE-acetaminophen (PERCOCET) 10-325 MG tablet Take 1 tablet by mouth 5 (five) times daily as needed. 11/22/21   [provider]  pantoprazole (PROTONIX) 40 MG tablet Take 1 tablet (40 mg total) by mouth 2 (two) times daily. 07/09/22   [name], MD  pregabalin (LYRICA) 50 MG  capsule Take 1 capsule (50 mg total) by mouth in the morning, at noon, and at bedtime. 06/16/22   Jessy Oto, MD  rosuvastatin (CRESTOR) 20 MG tablet Take 1 tablet (20 mg total) by mouth daily. 07/10/22   [name], MD  Semaglutide, 2 MG/DOSE, 8 MG/3ML SOPN Inject 2 mg as directed once a week. After completion of 4-week course of 1 mg dose 04/08/22   [name], MD  sertraline (ZOLOFT) 100 MG tablet Take 1 tablet (100 mg total) by mouth at bedtime. 04/08/22   [name], MD  tiZANidine (ZANAFLEX) 4 MG tablet Take 1 tablet (4 mg total) by mouth every 8 (eight) hours as needed for muscle spasms. 07/07/22   [name], MD  Vitamin D, Ergocalciferol, (DRISDOL) 1.25 MG (50000 UNIT) CAPS capsule Take 1 capsule (50,000 Units total) by mouth every 7 (seven) days. 07/07/22   Jessy Oto, MD  fluticasone-salmeterol (ADVAIR HFA) [phone] MCG/ACT inhaler Inhale 2 puffs into the lungs 2 (two) times daily. 10/15/17 10/15/17  [name], MD                                                                                                                                    Allergies Reglan [metoclopramide]  Review of Systems Review of Systems As noted in HPI  Physical Exam Vital Signs  I have reviewed the triage vital signs BP (!) 175/119   Pulse (!) 112   Temp 98.9 F (37.2 C) (Oral)   Resp 18   LMP  (LMP Unknown)   SpO2 97%   Physical Exam Vitals reviewed.  Constitutional:      General: She is not in acute distress.    Appearance: She is well-developed. She is not diaphoretic.  HENT:     Head:  Normocephalic and atraumatic.      Nose: Nose normal.  Eyes:     General: No scleral icterus.       Right eye: No discharge.        Left eye: No discharge.     Conjunctiva/sclera: Conjunctivae normal.     Pupils: Pupils are equal, round, and reactive to light.  Cardiovascular:     Rate and Rhythm: Normal rate and regular rhythm.     Heart sounds: No murmur heard.    No  friction rub. No gallop.  Pulmonary:     Effort: Pulmonary effort is normal. No respiratory distress.     Breath sounds: Normal breath sounds. No stridor. No rales.  Abdominal:     General: There is no distension.     Palpations: Abdomen is soft.     Tenderness: There is abdominal tenderness in the epigastric area and left upper quadrant.  Musculoskeletal:        General: No tenderness.     Cervical back: Normal range of motion and neck supple.  Skin:    General: Skin is warm and dry.     Findings: No erythema or rash.  Neurological:     Mental Status: She is alert and oriented to person, place, and time.     ED Results and Treatments Labs (all labs ordered are listed, but only abnormal results are displayed) Labs Reviewed  BASIC METABOLIC PANEL - Abnormal; Notable for the following components:      Result Value   CO2 21 (*)    Glucose, Bld 209 (*)    Anion gap 17 (*)    All other components within normal limits  CBC - Abnormal; Notable for the following components:   Platelets 405 (*)    All other components within normal limits  LIPASE, BLOOD  HEPATIC FUNCTION PANEL  I-STAT BETA HCG BLOOD, ED ([facility], [facility], [facility] ONLY)  TROPONIN I (HIGH SENSITIVITY)  TROPONIN I (HIGH SENSITIVITY)                                                                                                                         EKG  EKG Interpretation  Date/Time:  Saturday August 16 2022 00:23:47 EST Ventricular Rate:  106 PR Interval:  152 QRS Duration: 88 QT Interval:  370 QTC Calculation: 491 R Axis:   55 Text Interpretation: Sinus tachycardia Cannot rule out Anterior infarct , age undetermined Abnormal ECG When compared with ECG of 31-Oct-2021 19:41, No significant change was found Confirmed by Delora Fuel ([id number]) on 08/16/2022 12:39:55 AM       Radiology CT Angio Chest/Abd/Pel for Dissection W and/or Wo Contrast  Result Date: 08/16/2022 CLINICAL DATA:  Chest pain and vomiting for 2 days  EXAM: CT ANGIOGRAPHY CHEST, ABDOMEN AND PELVIS TECHNIQUE: Non-contrast CT of the chest was initially obtained. Multidetector CT imaging through the chest, abdomen and pelvis was performed using the standard protocol during bolus administration of intravenous contrast. Multiplanar reconstructed images  and MIPs were obtained and reviewed to evaluate the vascular anatomy. RADIATION DOSE REDUCTION: This exam was performed according to the departmental dose-optimization program which includes automated exposure control, adjustment of the mA and/or kV according to patient size and/or use of iterative reconstruction technique. CONTRAST:  170m OMNIPAQUE IOHEXOL 350 MG/ML SOLN COMPARISON:  10/12/2021 FINDINGS: CTA CHEST FINDINGS Cardiovascular: Initial precontrast images demonstrate no hyperdense crescent to suggest acute aortic injury. Post-contrast images demonstrate the thoracic aorta to be within normal limits. No aneurysmal dilatation or dissection is seen. The heart is not significantly enlarged. The pulmonary artery shows a normal branching pattern without intraluminal filling defect to suggest pulmonary embolism. Mediastinum/Nodes: Tracheostomy tube is noted in satisfactory position. No hilar or mediastinal adenopathy is noted. The esophagus is air-filled and within normal limits. Lungs/Pleura: The lungs are well aerated bilaterally. No focal infiltrate or sizable effusion is seen. Musculoskeletal: No rib fracture is seen. No compression deformity in the thoracic spine is noted. Review of the MIP images confirms the above findings. CTA ABDOMEN AND PELVIS FINDINGS VASCULAR Aorta: Normal caliber aorta without aneurysm, dissection, vasculitis or significant stenosis. Celiac: Patent without evidence of aneurysm, dissection, vasculitis or significant stenosis. SMA: Patent without evidence of aneurysm, dissection, vasculitis or significant stenosis. Renals: Both renal arteries are patent without evidence of aneurysm,  dissection, vasculitis, fibromuscular dysplasia or significant stenosis. IMA: Patent without evidence of aneurysm, dissection, vasculitis or significant stenosis. Inflow: Iliacs show no aneurysmal dilatation or dissection. Veins: No specific venous abnormality is noted. Review of the MIP images confirms the above findings. NON-VASCULAR Hepatobiliary: Fatty infiltration of the liver is noted. The gallbladder has been surgically removed. Pancreas: Unremarkable. No pancreatic ductal dilatation or surrounding inflammatory changes. Spleen: Normal in size without focal abnormality. Adrenals/Urinary Tract: Adrenal glands are within normal limits. Kidneys demonstrate a normal enhancement pattern bilaterally. No renal calculi or obstructive changes are seen. The bladder is partially distended. Stomach/Bowel: No obstructive or inflammatory changes of the colon are noted. Scattered fecal material is seen throughout the colon. The appendix is not visualized consistent with a prior surgical history. Small bowel and stomach are within normal limits. Lymphatic: No sizable adenopathy is seen. Reproductive: Status post hysterectomy. No adnexal masses. Other: No abdominal wall hernia or abnormality. No abdominopelvic ascites. Musculoskeletal: Postsurgical changes are seen at L4-5. No acute bony abnormality is noted. Review of the MIP images confirms the above findings. IMPRESSION: No evidence of aortic dissection or aneurysmal dilatation. No evidence of pulmonary emboli. No acute abnormality to correspond with the given clinical history. Chronic changes as described above. Electronically Signed   By: MInez CatalinaM.D.   On: 08/16/2022 03:18    Medications Ordered in ED Medications  ondansetron (ZOFRAN-ODT) disintegrating tablet 4 mg (4 mg Oral Given 08/16/22 0046)  fentaNYL (SUBLIMAZE) injection 75 mcg (75 mcg Intravenous Given 08/16/22 0126)  pantoprazole (PROTONIX) injection 40 mg (40 mg Intravenous Given 08/16/22 0138)   prochlorperazine (COMPAZINE) injection 10 mg (10 mg Intravenous Given 08/16/22 0138)  iohexol (OMNIPAQUE) 350 MG/ML injection 100 mL (100 mLs Intravenous Contrast Given 08/16/22 0253)  Procedures Procedures  (including critical care time)  Medical Decision Making / ED Course   Medical Decision Making Amount and/or Complexity of Data Reviewed Labs: ordered. Decision-making details documented in ED Course. Radiology: ordered and independent interpretation performed. Decision-making details documented in ED Course. ECG/medicine tests: ordered and independent interpretation performed. Decision-making details documented in ED Course.  Risk Prescription drug management.    Upper abdominal pain with associated chest pain. Given the patient's past medical history, likely gastritis with esophagitis however will need to rule out esophageal perforation versus dissection given the significantly elevated blood pressure. Lower suspicion for ACS but will obtain cardiac work-up to rule it out. We will also get labs to assess for any electrolyte or metabolic derangements including DKA.  Will assess for any biliary obstruction or pancreatitis.  Patient provided with IV pain medicine, IV Protonix and antiemetics.  EKG without acute ischemic changes or evidence of pericarditis.  Serial troponins negative x2. CBC without leukocytosis or anemia.  Metabolic panel without significant electrolyte derangements or renal sufficiency.  Hyperglycemia without evidence of DKA.  CTA negative for aortic dissection or perforation.  On reassessment, patient pain had completely subsided.  Able to tolerate p.o. intake.      Final Clinical Impression(s) / ED Diagnoses Final diagnoses:  Upper abdominal pain  Chest pain due to GERD   The patient appears reasonably screened and/or  stabilized for discharge and I doubt any other medical condition or other [facility] requiring further screening, evaluation, or treatment in the ED at this time. I have discussed the findings, Dx and Tx plan with the patient/family who expressed understanding and agree(s) with the plan. Discharge instructions discussed at length. The patient/family was given strict return precautions who verbalized understanding of the instructions. No further questions at time of discharge.  Disposition: Discharge  Condition: Good  ED Discharge Orders     None        Follow Up: [name], MD [place] [id number] [phone]  Call  to schedule an appointment for close follow up           This chart was dictated using voice recognition software.  Despite best efforts to proofread,  errors can occur which can change the documentation meaning.    Fatima Blank, MD 08/16/22 [phone]

## 2022-08-16 NOTE — ED Provider Triage Note (Signed)
Emergency Medicine Provider Triage Evaluation Note  [name] , a 51 y.o. female  was evaluated in triage.  Pt complains of severe chest pain, vomiting for the last 2 days, patient with vomiting at time of triage evaluation, she has a tracheostomy in place.  She reports the pain is 12/10 in nature, she appears in acute distress.  She arrives tachycardic, with hypertension.  He denies any previous cardiac history of cough, heart attack, stroke, aneurysm.  Reviewed.  She describes the pain as sharp in nature.  Review of Systems  Positive: Chest pain, nausea, vomiting Negative: Fever, chills  Physical Exam  BP (!) 204/124   Pulse (!) 111   Temp 98.9 F (37.2 C)   Resp (!) 22   LMP  (LMP Unknown)   SpO2 100%  Gen:   Awake, acute distress Resp:  Increased effort MSK:   Moves extremities without difficulty  Other:  Tachycardia, gasping respiration.  Medical Decision Making  Medically screening exam initiated at 12:59 AM.  Appropriate orders placed.  [name] was informed that the remainder of the evaluation will be completed by another provider, this initial triage assessment does not replace that evaluation, and the importance of remaining in the ED until their evaluation is complete.  Charge nurse informed that patient needs a room as early as possible, dissection study ordered as well as pain control in the meantime   Anselmo Pickler, PA-C 08/16/22 0103

## 2022-08-17 ENCOUNTER — Other Ambulatory Visit: Payer: Self-pay | Admitting: Family Medicine

## 2022-08-17 DIAGNOSIS — R053 Chronic cough: Secondary | ICD-10-CM

## 2022-08-17 DIAGNOSIS — E119 Type 2 diabetes mellitus without complications: Secondary | ICD-10-CM

## 2022-08-18 ENCOUNTER — Inpatient Hospital Stay (HOSPITAL_COMMUNITY)
Admission: EM | Admit: 2022-08-18 | Discharge: 2022-08-20 | DRG: 155 | Disposition: A | Discharge status: Other Acute Inpatient Hospital | Payer: Medicare Other | Attending: Internal Medicine | Admitting: Internal Medicine

## 2022-08-18 ENCOUNTER — Encounter (HOSPITAL_COMMUNITY): Payer: Self-pay

## 2022-08-18 ENCOUNTER — Other Ambulatory Visit: Payer: Self-pay

## 2022-08-18 ENCOUNTER — Emergency Department (HOSPITAL_COMMUNITY): Payer: Medicare Other

## 2022-08-18 DIAGNOSIS — J45909 Unspecified asthma, uncomplicated: Secondary | ICD-10-CM | POA: Diagnosis present

## 2022-08-18 DIAGNOSIS — Z823 Family history of stroke: Secondary | ICD-10-CM

## 2022-08-18 DIAGNOSIS — J386 Stenosis of larynx: Secondary | ICD-10-CM | POA: Diagnosis not present

## 2022-08-18 DIAGNOSIS — I10 Essential (primary) hypertension: Secondary | ICD-10-CM | POA: Diagnosis not present

## 2022-08-18 DIAGNOSIS — Z7951 Long term (current) use of inhaled steroids: Secondary | ICD-10-CM

## 2022-08-18 DIAGNOSIS — N179 Acute kidney failure, unspecified: Secondary | ICD-10-CM | POA: Diagnosis not present

## 2022-08-18 DIAGNOSIS — I5032 Chronic diastolic (congestive) heart failure: Secondary | ICD-10-CM | POA: Diagnosis present

## 2022-08-18 DIAGNOSIS — E1069 Type 1 diabetes mellitus with other specified complication: Secondary | ICD-10-CM

## 2022-08-18 DIAGNOSIS — G43119 Migraine with aura, intractable, without status migrainosus: Secondary | ICD-10-CM

## 2022-08-18 DIAGNOSIS — Z930 Tracheostomy status: Secondary | ICD-10-CM

## 2022-08-18 DIAGNOSIS — J398 Other specified diseases of upper respiratory tract: Secondary | ICD-10-CM | POA: Diagnosis present

## 2022-08-18 DIAGNOSIS — E109 Type 1 diabetes mellitus without complications: Secondary | ICD-10-CM

## 2022-08-18 DIAGNOSIS — I509 Heart failure, unspecified: Secondary | ICD-10-CM

## 2022-08-18 DIAGNOSIS — Z7982 Long term (current) use of aspirin: Secondary | ICD-10-CM

## 2022-08-18 DIAGNOSIS — F32A Depression, unspecified: Secondary | ICD-10-CM | POA: Diagnosis present

## 2022-08-18 DIAGNOSIS — E1165 Type 2 diabetes mellitus with hyperglycemia: Secondary | ICD-10-CM | POA: Diagnosis present

## 2022-08-18 DIAGNOSIS — Z6841 Body Mass Index (BMI) 40.0 and over, adult: Secondary | ICD-10-CM

## 2022-08-18 DIAGNOSIS — J4540 Moderate persistent asthma, uncomplicated: Secondary | ICD-10-CM | POA: Diagnosis not present

## 2022-08-18 DIAGNOSIS — R042 Hemoptysis: Secondary | ICD-10-CM | POA: Diagnosis not present

## 2022-08-18 DIAGNOSIS — E1142 Type 2 diabetes mellitus with diabetic polyneuropathy: Secondary | ICD-10-CM | POA: Diagnosis present

## 2022-08-18 DIAGNOSIS — Z79899 Other long term (current) drug therapy: Secondary | ICD-10-CM

## 2022-08-18 DIAGNOSIS — J9501 Hemorrhage from tracheostomy stoma: Secondary | ICD-10-CM | POA: Diagnosis present

## 2022-08-18 DIAGNOSIS — G894 Chronic pain syndrome: Secondary | ICD-10-CM | POA: Diagnosis present

## 2022-08-18 DIAGNOSIS — G4733 Obstructive sleep apnea (adult) (pediatric): Secondary | ICD-10-CM | POA: Diagnosis present

## 2022-08-18 DIAGNOSIS — Z7984 Long term (current) use of oral hypoglycemic drugs: Secondary | ICD-10-CM

## 2022-08-18 DIAGNOSIS — J9611 Chronic respiratory failure with hypoxia: Secondary | ICD-10-CM | POA: Diagnosis present

## 2022-08-18 DIAGNOSIS — J441 Chronic obstructive pulmonary disease with (acute) exacerbation: Secondary | ICD-10-CM | POA: Diagnosis present

## 2022-08-18 DIAGNOSIS — Z833 Family history of diabetes mellitus: Secondary | ICD-10-CM

## 2022-08-18 DIAGNOSIS — Z825 Family history of asthma and other chronic lower respiratory diseases: Secondary | ICD-10-CM

## 2022-08-18 DIAGNOSIS — Z888 Allergy status to other drugs, medicaments and biological substances status: Secondary | ICD-10-CM

## 2022-08-18 DIAGNOSIS — I110 Hypertensive heart disease with heart failure: Secondary | ICD-10-CM | POA: Diagnosis present

## 2022-08-18 DIAGNOSIS — K219 Gastro-esophageal reflux disease without esophagitis: Secondary | ICD-10-CM | POA: Diagnosis not present

## 2022-08-18 DIAGNOSIS — J0410 Acute tracheitis without obstruction: Secondary | ICD-10-CM | POA: Diagnosis present

## 2022-08-18 DIAGNOSIS — Z8616 Personal history of COVID-19: Secondary | ICD-10-CM

## 2022-08-18 DIAGNOSIS — D649 Anemia, unspecified: Secondary | ICD-10-CM | POA: Diagnosis present

## 2022-08-18 DIAGNOSIS — M549 Dorsalgia, unspecified: Secondary | ICD-10-CM | POA: Diagnosis present

## 2022-08-18 DIAGNOSIS — Z8249 Family history of ischemic heart disease and other diseases of the circulatory system: Secondary | ICD-10-CM

## 2022-08-18 DIAGNOSIS — E119 Type 2 diabetes mellitus without complications: Secondary | ICD-10-CM | POA: Diagnosis not present

## 2022-08-18 DIAGNOSIS — F411 Generalized anxiety disorder: Secondary | ICD-10-CM | POA: Diagnosis present

## 2022-08-18 DIAGNOSIS — Z8261 Family history of arthritis: Secondary | ICD-10-CM

## 2022-08-18 DIAGNOSIS — F330 Major depressive disorder, recurrent, mild: Secondary | ICD-10-CM

## 2022-08-18 DIAGNOSIS — E785 Hyperlipidemia, unspecified: Secondary | ICD-10-CM | POA: Diagnosis present

## 2022-08-18 DIAGNOSIS — Z87891 Personal history of nicotine dependence: Secondary | ICD-10-CM

## 2022-08-18 DIAGNOSIS — G43909 Migraine, unspecified, not intractable, without status migrainosus: Secondary | ICD-10-CM | POA: Diagnosis present

## 2022-08-18 DIAGNOSIS — Z794 Long term (current) use of insulin: Secondary | ICD-10-CM

## 2022-08-18 HISTORY — DX: Abnormal findings on diagnostic imaging of other parts of digestive tract: R93.3

## 2022-08-18 LAB — CBC WITH DIFFERENTIAL/PLATELET
Abs Immature Granulocytes: 0.02 10*3/uL (ref 0.00–0.07)
Basophils Absolute: 0 10*3/uL (ref 0.0–0.1)
Basophils Relative: 1 %
Eosinophils Absolute: 0.1 10*3/uL (ref 0.0–0.5)
Eosinophils Relative: 1 %
HCT: 34.4 % — ABNORMAL LOW (ref 36.0–46.0)
Hemoglobin: 10.8 g/dL — ABNORMAL LOW (ref 12.0–15.0)
Immature Granulocytes: 0 %
Lymphocytes Relative: 54 %
Lymphs Abs: 4.4 10*3/uL — ABNORMAL HIGH (ref 0.7–4.0)
MCH: 28.5 pg (ref 26.0–34.0)
MCHC: 31.4 g/dL (ref 30.0–36.0)
MCV: 90.8 fL (ref 80.0–100.0)
Monocytes Absolute: 0.6 10*3/uL (ref 0.1–1.0)
Monocytes Relative: 7 %
Neutro Abs: 2.9 10*3/uL (ref 1.7–7.7)
Neutrophils Relative %: 37 %
Platelets: 353 10*3/uL (ref 150–400)
RBC: 3.79 MIL/uL — ABNORMAL LOW (ref 3.87–5.11)
RDW: 14.7 % (ref 11.5–15.5)
WBC: 8 10*3/uL (ref 4.0–10.5)
nRBC: 0 % (ref 0.0–0.2)

## 2022-08-18 LAB — COMPREHENSIVE METABOLIC PANEL
ALT: 26 U/L (ref 0–44)
AST: 26 U/L (ref 15–41)
Albumin: 3.6 g/dL (ref 3.5–5.0)
Alkaline Phosphatase: 105 U/L (ref 38–126)
Anion gap: 8 (ref 5–15)
BUN: 28 mg/dL — ABNORMAL HIGH (ref 6–20)
CO2: 23 mmol/L (ref 22–32)
Calcium: 8.5 mg/dL — ABNORMAL LOW (ref 8.9–10.3)
Chloride: 107 mmol/L (ref 98–111)
Creatinine, Ser: 1.88 mg/dL — ABNORMAL HIGH (ref 0.44–1.00)
GFR, Estimated: 32 mL/min — ABNORMAL LOW (ref >60)
Glucose, Bld: 225 mg/dL — ABNORMAL HIGH (ref 70–99)
Potassium: 4 mmol/L (ref 3.5–5.1)
Sodium: 138 mmol/L (ref 135–145)
Total Bilirubin: 0.5 mg/dL (ref 0.3–1.2)
Total Protein: 7 g/dL (ref 6.5–8.1)

## 2022-08-18 LAB — CBG MONITORING, ED
Glucose-Capillary: 84 mg/dL (ref 70–99)
Glucose-Capillary: 179 mg/dL — ABNORMAL HIGH (ref 70–99)

## 2022-08-18 LAB — RESP PANEL BY RT-PCR (FLU A&B, COVID) ARPGX2
Influenza A by PCR: NEGATIVE
Influenza B by PCR: NEGATIVE
SARS Coronavirus 2 by RT PCR: NEGATIVE

## 2022-08-18 LAB — BRAIN NATRIURETIC PEPTIDE: B Natriuretic Peptide: 3.1 pg/mL (ref 0.0–100.0)

## 2022-08-18 MED ORDER — ALBUTEROL SULFATE (2.5 MG/3ML) 0.083% IN NEBU
2.5000 mg | INHALATION_SOLUTION | Freq: Four times a day (QID) | RESPIRATORY_TRACT | Status: DC | PRN
  Administered 2022-08-19 – 2022-08-20 (×2): 2.5 mg via RESPIRATORY_TRACT
  Filled 2022-08-18 (×2): qty 3

## 2022-08-18 MED ORDER — SERTRALINE HCL 100 MG PO TABS
100.0000 mg | ORAL_TABLET | Freq: Every day | ORAL | Status: DC
  Administered 2022-08-18 – 2022-08-19 (×2): 100 mg via ORAL
  Filled 2022-08-18 (×2): qty 1

## 2022-08-18 MED ORDER — INSULIN GLARGINE-YFGN 100 UNIT/ML ~~LOC~~ SOLN
100.0000 [IU] | Freq: Every day | SUBCUTANEOUS | Status: DC
  Administered 2022-08-19 – 2022-08-20 (×2): 100 [IU] via SUBCUTANEOUS
  Filled 2022-08-18 (×2): qty 1

## 2022-08-18 MED ORDER — PREGABALIN 50 MG PO CAPS
50.0000 mg | ORAL_CAPSULE | Freq: Three times a day (TID) | ORAL | Status: DC
  Administered 2022-08-18 – 2022-08-20 (×7): 50 mg via ORAL
  Filled 2022-08-18 (×2): qty 1
  Filled 2022-08-18 (×2): qty 2
  Filled 2022-08-18: qty 1
  Filled 2022-08-18: qty 2
  Filled 2022-08-18: qty 1

## 2022-08-18 MED ORDER — INSULIN ASPART 100 UNIT/ML IJ SOLN: 0.0000 [IU] | Freq: Every day | INTRAMUSCULAR | Status: DC

## 2022-08-18 MED ORDER — HYDROXYZINE HCL 25 MG PO TABS
25.0000 mg | ORAL_TABLET | Freq: Three times a day (TID) | ORAL | Status: DC | PRN
  Administered 2022-08-18: 25 mg via ORAL
  Filled 2022-08-18: qty 1

## 2022-08-18 MED ORDER — OXYCODONE-ACETAMINOPHEN 10-325 MG PO TABS: 1.0000 | ORAL_TABLET | Freq: Four times a day (QID) | ORAL | Status: DC | PRN

## 2022-08-18 MED ORDER — INSULIN ASPART 100 UNIT/ML IJ SOLN
0.0000 [IU] | Freq: Three times a day (TID) | INTRAMUSCULAR | Status: DC
  Administered 2022-08-19: 5 [IU] via SUBCUTANEOUS
  Administered 2022-08-19 – 2022-08-20 (×3): 3 [IU] via SUBCUTANEOUS

## 2022-08-18 MED ORDER — OXYCODONE-ACETAMINOPHEN 5-325 MG PO TABS
1.0000 | ORAL_TABLET | Freq: Four times a day (QID) | ORAL | Status: DC | PRN
  Administered 2022-08-18 – 2022-08-20 (×5): 1 via ORAL
  Filled 2022-08-18 (×5): qty 1

## 2022-08-18 MED ORDER — MONTELUKAST SODIUM 10 MG PO TABS
10.0000 mg | ORAL_TABLET | Freq: Every day | ORAL | Status: DC
  Administered 2022-08-18 – 2022-08-19 (×2): 10 mg via ORAL
  Filled 2022-08-18 (×2): qty 1

## 2022-08-18 MED ORDER — PANTOPRAZOLE SODIUM 40 MG PO TBEC
40.0000 mg | DELAYED_RELEASE_TABLET | Freq: Two times a day (BID) | ORAL | Status: DC
  Administered 2022-08-18 – 2022-08-20 (×5): 40 mg via ORAL
  Filled 2022-08-18 (×5): qty 1

## 2022-08-18 MED ORDER — POLYETHYLENE GLYCOL 3350 17 G PO PACK: 17.0000 g | PACK | Freq: Every day | ORAL | Status: DC | PRN

## 2022-08-18 MED ORDER — SODIUM CHLORIDE 0.9 % IV SOLN
INTRAVENOUS | Status: AC
  Administered 2022-08-18: 125 mL via INTRAVENOUS
  Administered 2022-08-19: 1000 mL via INTRAVENOUS

## 2022-08-18 MED ORDER — SODIUM CHLORIDE 0.9% FLUSH
3.0000 mL | Freq: Two times a day (BID) | INTRAVENOUS | Status: DC
  Administered 2022-08-18 – 2022-08-20 (×4): 3 mL via INTRAVENOUS

## 2022-08-18 MED ORDER — SODIUM CHLORIDE 0.9 % IV BOLUS
500.0000 mL | Freq: Once | INTRAVENOUS | Status: AC
  Administered 2022-08-18: 500 mL via INTRAVENOUS

## 2022-08-18 MED ORDER — ROSUVASTATIN CALCIUM 20 MG PO TABS
20.0000 mg | ORAL_TABLET | Freq: Every day | ORAL | Status: DC
  Administered 2022-08-19 – 2022-08-20 (×2): 20 mg via ORAL
  Filled 2022-08-18 (×2): qty 1

## 2022-08-18 MED ORDER — HYDROCODONE BIT-HOMATROP MBR 5-1.5 MG/5ML PO SOLN
5.0000 mL | Freq: Once | ORAL | Status: AC
  Administered 2022-08-18: 5 mL via ORAL
  Filled 2022-08-18: qty 5

## 2022-08-18 MED ORDER — OXYCODONE HCL 5 MG PO TABS
5.0000 mg | ORAL_TABLET | Freq: Four times a day (QID) | ORAL | Status: DC | PRN
  Administered 2022-08-18 – 2022-08-20 (×4): 5 mg via ORAL
  Filled 2022-08-18 (×4): qty 1

## 2022-08-18 MED ORDER — MOMETASONE FURO-FORMOTEROL FUM 200-5 MCG/ACT IN AERO
2.0000 | INHALATION_SPRAY | Freq: Two times a day (BID) | RESPIRATORY_TRACT | Status: DC
  Administered 2022-08-18 – 2022-08-20 (×4): 2 via RESPIRATORY_TRACT
  Filled 2022-08-18: qty 8.8

## 2022-08-18 MED ORDER — AMITRIPTYLINE HCL 10 MG PO TABS
10.0000 mg | ORAL_TABLET | Freq: Every day | ORAL | Status: DC
  Administered 2022-08-18 – 2022-08-19 (×2): 10 mg via ORAL
  Filled 2022-08-18 (×4): qty 1

## 2022-08-18 MED ORDER — ACETAMINOPHEN 650 MG RE SUPP: 650.0000 mg | Freq: Four times a day (QID) | RECTAL | Status: DC | PRN

## 2022-08-18 MED ORDER — BUPROPION HCL ER (SR) 150 MG PO TB12
150.0000 mg | ORAL_TABLET | Freq: Two times a day (BID) | ORAL | Status: DC
  Administered 2022-08-18 – 2022-08-20 (×4): 150 mg via ORAL
  Filled 2022-08-18 (×5): qty 1

## 2022-08-18 MED ORDER — ACETAMINOPHEN 325 MG PO TABS
650.0000 mg | ORAL_TABLET | Freq: Four times a day (QID) | ORAL | Status: DC | PRN
  Administered 2022-08-18: 650 mg via ORAL
  Filled 2022-08-18 (×2): qty 2

## 2022-08-18 NOTE — H&P (Addendum)
Admission history and physical  [name] VHQ:[id number] DOB: 08-01-1971 DOA: 08/18/2022  PCP: [name], MD   Patient coming from: Home  Chief Complaint: Hemoptysis  HPI: [name] is a 51 y.o. female with medical history significant of asthma, chronic respiratory failure, chronic pain, diastolic CHF, hypertension, depression, anxiety, GERD, diabetes, obesity, trach dependence, anemia, OSA presenting with hemoptysis.  Patient reporting 2 weeks of gradually increasing hemoptysis.  This is led to her having to increase frequency of suctioning of her trachea due to blood volume.  She does report some chest pain and abdominal pain associated with a cough and increased pain lying down flat..  Pain not relieved with home pain medications but appears to be minimal at rest when she is not coughing.  In addition to cough she notes having a fever of around 101 on Friday but none since. She does report episode of nausea and vomiting previously but none recently as well.  She denies chills, shortness of breath, constipation, diarrhea.  ADDENDUM: Originally patient was seen as a admit and H&P done.  This has been changed to consult note after ENT has reviewed her case and determined that she would best be served with transfer to [place].  ED Course: Vital signs in the ED stable.  Lab workup included CMP with BUN of 28 and creatinine elevated to 1.88 from 1 just a few days ago.  Glucose 225.  CBC with hemoglobin of 10.8 which is near baseline of 11-12.  BNP normal.  Respiratory panel for flu and COVID pending.  Chest x-ray showed no acute normality.  Patient seen in the ED couple days ago reportedly did not mention hemoptysis at that time but workup included a CTA dissection study which did not show acute abnormality.  Patient has received 500 cc of IV fluid and hydrocodone syrup in the ED.  Admission requested for AKI and hemoptysis.  I asked EDP to  discuss her case with on-call ENT given she has significant trach history and multiple ENT procedures due to issues with her anatomy.  Review of Systems: As per HPI otherwise all other systems reviewed and are negative.  Past Medical History:  Diagnosis Date   Abnormal UGI series    Acute pain of right shoulder due to trauma 10/05/2018   Acute respiratory failure with hypoxia and hypercapnia (HCC) 10/23/2018   Allergy    Anxiety    Arthritis    Asthma    Chest pain of uncertain etiology 41/32/4401   Chronic back pain    Chronic chest pain    resolved, no problems since 2019 per patient 07/27/19   Coffee ground vomiting 10/14/2021   COPD (chronic obstructive pulmonary disease) ([place])    Depression    DM (diabetes mellitus) ([place])    INSULIN DEPENDENT - TYPE 1   Dyspnea    GERD (gastroesophageal reflux disease)    Headache(784.0)    History of hiatal hernia    Hyperlipidemia    no med, diet controlled   Hypertension    Hypokalemia    Klebsiella cystitis 03/14/2017   Last Assessment & Plan:   Formatting of this note might be different from the original.  - On Zosyn  - Urine culture: Klebsiella pneumoniae   Pneumonia    Respiratory disease 05/2017   uses inhalers, neb tx prn, no oxygen   Rotator cuff tear 07/28/2019   Sleep apnea    does not use CPAP due to trach   Status post  tracheostomy ([place]) 03/14/2017   Last Assessment & Plan:  Formatting of this note might be different from the original. - Post [place] - On tach - ENT following along (please see above for current trach management plan)   Tracheostomy in place [facility]) 02/2017    Past Surgical History:  Procedure Laterality Date   ABDOMINAL HYSTERECTOMY  2009   APPENDECTOMY     BIOPSY  10/15/2021   Procedure: BIOPSY;  Surgeon: Thornton Park, MD;  Location: [place];  Service: Gastroenterology;;   CESAREAN SECTION     x 3   CHOLECYSTECTOMY N/A 03/02/2014   Procedure: LAPAROSCOPIC  CHOLECYSTECTOMY;  Surgeon: Joyice Faster. Cornett, MD;  Location: [place];  Service: General;  Laterality: N/A;   ESOPHAGOGASTRODUODENOSCOPY (EGD) WITH PROPOFOL N/A 10/15/2021   Procedure: ESOPHAGOGASTRODUODENOSCOPY (EGD) WITH PROPOFOL;  Surgeon: Thornton Park, MD;  Location: [place];  Service: Gastroenterology;  Laterality: N/A;   HERNIA REPAIR     PANENDOSCOPY N/A 03/04/2017   Procedure: PANENDOSCOPY WITH POSSIBLE FOREIGN BODY REMOVAL;  Surgeon: Jodi Marble, MD;  Location: [place];  Service: ENT;  Laterality: N/A;   ROTATOR CUFF REPAIR Left    ROTATOR CUFF REPAIR Right 07/27/2019   SHOULDER ARTHROSCOPY WITH SUBACROMIAL DECOMPRESSION, ROTATOR CUFF REPAIR AND BICEP TENDON REPAIR Right 07/28/2019   Procedure: RIGHT SHOULDER ARTHROSCOP, MINI OPEN ROTATOR CUFF TEAR REPAIR,  BICEPS TENODESIS, DISTAL CLAVICLE EXCISION;  Surgeon: Meredith Pel, MD;  Location: [place];  Service: Orthopedics;  Laterality: Right;   TOOTH EXTRACTION N/A 02/22/2021   Procedure: DENTAL RESTORATION/EXTRACTIONS;  Surgeon: Diona Browner, DMD;  Location[facility];  Service: Oral Surgery;  Laterality: N/A;   TRACHEOSTOMY  02/2017   TUBAL LIGATION     VESICOVAGINAL FISTULA CLOSURE W/ TAH  2009    Social History  reports that she quit smoking about 6 years ago. Her smoking use included cigarettes. She has a 12.50 pack-year smoking history. She has never used smokeless tobacco. She reports current alcohol use. She reports that she does not currently use drugs after having used the following drugs: Marijuana.  Allergies  Allergen Reactions   Reglan [Metoclopramide] Other (See Comments)    Panic attack    Family History  Problem Relation Age of Onset   Heart attack Mother    Stroke Mother    Diabetes Mother    Hypertension Mother    Arthritis Mother    Stroke Father    Asthma Sister    Hypertension Sister    Asthma Sister    Diabetes Sister    Asthma Brother    Seizures Brother    Asthma Brother    Diabetes Brother     Asthma Daughter    Asthma Son    Asthma Grandson    Colon cancer Neg Hx    Rectal cancer Neg Hx    Stomach cancer Neg Hx    Esophageal cancer Neg Hx    Ovarian cancer Neg Hx    Pancreatic cancer Neg Hx   Reviewed on admission  Prior to Admission medications   Medication Sig Start Date End Date Taking? Authorizing Provider  albuterol (PROAIR HFA) 108 (90 Base) MCG/ACT inhaler Inhale 2 puffs into the lungs every 6 (six) hours as needed for wheezing or shortness of breath. 09/24/21   [name], MD  albuterol (PROVENTIL) (2.5 MG/3ML) 0.083% nebulizer solution Take 3 mLs (2.5 mg total) by nebulization every 6 (six) hours as needed for wheezing or shortness of breath. 09/24/21   Newlin,  Enobong, MD  amitriptyline (ELAVIL) 10 MG tablet Take 1 tablet (10 mg total) by mouth at bedtime. 07/09/22   [name], MD  aspirin EC 81 MG EC tablet Take 1 tablet (81 mg total) by mouth daily. Swallow whole. 08/27/21   Jessy Oto, MD  benzonatate (TESSALON) 100 MG capsule Take 1 capsule (100 mg total) by mouth 2 (two) times daily as needed for cough. 07/09/22   [name], MD  Blood Glucose Monitoring Suppl (ONETOUCH VERIO) w/Device KIT 1 each by Does not apply route daily. Use to monitor glucose levels daily; E11.9 06/20/22   Elayne Snare, MD  buPROPion [facility] SR) 150 MG 12 hr tablet Take 1 tablet (150 mg total) by mouth 2 (two) times daily. 04/08/22   [name], MD  cetirizine (ZYRTEC) 10 MG tablet TAKE 1 TABLET BY MOUTH AT BEDTIME 12/10/21   Spero Geralds, MD  Continuous Blood Gluc Receiver (FREESTYLE LIBRE 2 READER) DEVI Use to check blood sugar three times daily. E11.9 07/09/22   [name], MD  Continuous Blood Gluc Sensor (FREESTYLE LIBRE 2 SENSOR) MISC Use to check blood sugar three times daily. Change sensor once every 14 days. E11.9 07/09/22   [name], MD  dapagliflozin propanediol (FARXIGA) 10 MG TABS tablet Take 1 tablet (10 mg total) by mouth daily before  breakfast. 07/09/22   [name], MD  diclofenac Sodium (VOLTAREN) 1 % GEL Apply 4 g topically 4 (four) times daily. 11/27/20   [name], MD  furosemide (LASIX) 20 MG tablet Take 1 tablet (20 mg total) by mouth daily. 07/09/22   [name], MD  glucose blood (ONETOUCH VERIO) test strip Use to check glucose levels TID. 06/20/22   [name], MD  hydrOXYzine (ATARAX) 25 MG tablet Take 1 tablet (25 mg total) by mouth 3 (three) times daily as needed. for anxiety 04/08/22   [name], MD  insulin degludec (TRESIBA FLEXTOUCH) 200 UNIT/ML FlexTouch Pen Inject 160 Units into the skin daily. 07/09/22   [name], MD  Insulin Pen Needle 31G X 8 MM MISC Inject 1 Units as directed daily. 07/30/18   [provider]  lisinopril (ZESTRIL) 20 MG tablet Take 1 tablet (20 mg total) by mouth daily. 04/08/22   [name], MD  melatonin 3 MG TABS tablet Take 1 tablet (3 mg total) by mouth at bedtime. 06/20/22   [name], MD  Misc. Devices MISC Nebulizer device. Diagnosis - Asthma 07/16/20   [name], MD  Misc. Devices [place] chair 04/03/21   [name], MD  Misc. Devices MISC Bedside commode. Diagnosis- low back pain 09/02/21   [name], MD  Misc. Devices [employer].  Diagnosis-low back pain 06/20/22   [name], MD  Misc. Devices [employer].  Diagnosis-low back pain 06/20/22   [name], MD  mometasone-formoterol (DULERA) 200-5 MCG/ACT AERO Inhale 2 puffs into the lungs in the morning and at bedtime. 07/09/22   [name], MD  montelukast (SINGULAIR) 10 MG tablet Take 1 tablet (10 mg total) by mouth at bedtime. 07/09/22   [name], MD  ondansetron (ZOFRAN) 4 MG tablet Take 1 tablet (4 mg total) by mouth every 8 (eight) hours as needed for nausea or vomiting. 07/07/22   [name], MD  OneTouch Delica Lancets 33A MISC 1 each by Does not apply route daily. Use to monitor glucose levels daily; E11.9 04/14/19   Renato Shin, MD   oxyCODONE-acetaminophen (PERCOCET) 10-325 MG tablet Take 1 tablet by mouth 5 (  five) times daily as needed. 11/22/21   [provider]  pantoprazole (PROTONIX) 40 MG tablet Take 1 tablet (40 mg total) by mouth 2 (two) times daily. 07/09/22   [name], MD  pregabalin (LYRICA) 50 MG capsule Take 1 capsule (50 mg total) by mouth in the morning, at noon, and at bedtime. 06/16/22   Jessy Oto, MD  rosuvastatin (CRESTOR) 20 MG tablet Take 1 tablet (20 mg total) by mouth daily. 07/10/22   [name], MD  Semaglutide, 2 MG/DOSE, 8 MG/3ML SOPN Inject 2 mg as directed once a week. After completion of 4-week course of 1 mg dose 04/08/22   [name], MD  sertraline (ZOLOFT) 100 MG tablet Take 1 tablet (100 mg total) by mouth at bedtime. 04/08/22   [name], MD  tiZANidine (ZANAFLEX) 4 MG tablet Take 1 tablet (4 mg total) by mouth every 8 (eight) hours as needed for muscle spasms. 07/07/22   [name], MD  Vitamin D, Ergocalciferol, (DRISDOL) 1.25 MG (50000 UNIT) CAPS capsule Take 1 capsule (50,000 Units total) by mouth every 7 (seven) days. 07/07/22   Jessy Oto, MD  fluticasone-salmeterol (ADVAIR HFA) [phone] MCG/ACT inhaler Inhale 2 puffs into the lungs 2 (two) times daily. 10/15/17 10/15/17  [name], MD    Physical Exam: Vitals:   08/18/22 7544 08/18/22 0927 08/18/22 1032 08/18/22 1104  BP: 132/89   128/82  Pulse: 100  89 90  Resp: _0 Temp: 98.3 F (36.8 C)     TempSrc: Oral     SpO2: 95%  100% 99%  Weight:  112.5 kg    Height:  _1  (1.549 m)      Physical Exam Constitutional:      General: She is not in acute distress.    Appearance: Normal appearance. She is obese.  HENT:     Head: Normocephalic and atraumatic.     Mouth/Throat:     Mouth: Mucous membranes are moist.     Pharynx: Oropharynx is clear.     Comments: Small-caliber trach in place.  Noted bloody secretions with cough while in the room. Eyes:     Extraocular Movements:  Extraocular movements intact.     Pupils: Pupils are equal, round, and reactive to light.  Cardiovascular:     Rate and Rhythm: Normal rate and regular rhythm.     Pulses: Normal pulses.     Heart sounds: Normal heart sounds.  Pulmonary:     Effort: Pulmonary effort is normal. No respiratory distress.     Breath sounds: Wheezing present.  Abdominal:     General: Bowel sounds are normal. There is no distension.     Palpations: Abdomen is soft.     Tenderness: There is no abdominal tenderness.  Musculoskeletal:        General: No swelling or deformity.  Skin:    General: Skin is warm and dry.  Neurological:     General: No focal deficit present.     Mental Status: Mental status is at baseline.    Labs on Admission: I have personally reviewed following labs and imaging studies  CBC: Recent Labs  Lab 08/16/22 0035 08/18/22 0954  WBC 10.5 8.0  NEUTROABS  --  2.9  HGB 12.5 10.8*  HCT 38.4 34.4*  MCV 86.9 90.8  PLT 405* 920    Basic Metabolic Panel: Recent Labs  Lab 08/16/22 0035 08/18/22 0954  NA 137 138  K 3.8 4.0  CL 99  107  CO2 21* 23  GLUCOSE 209* 225*  BUN 12 28*  CREATININE 0.96 1.88*  CALCIUM 9.8 8.5*    GFR: Estimated Creatinine Clearance: 40.7 mL/min (A) (by C-G formula based on SCr of 1.88 mg/dL (H)).  Liver Function Tests: Recent Labs  Lab 08/16/22 0200 08/18/22 0954  AST 29 26  ALT 31 26  ALKPHOS 104 105  BILITOT 0.4 0.5  PROT 8.0 7.0  ALBUMIN 3.8 3.6    Urine analysis:    Component Value Date/Time   COLORURINE YELLOW 10/14/2021 1154   APPEARANCEUR CLEAR 10/14/2021 1154   LABSPEC 1.017 10/14/2021 1154   PHURINE 6.0 10/14/2021 1154   GLUCOSEU NEGATIVE 10/14/2021 1154   HGBUR NEGATIVE 10/14/2021 1154   BILIRUBINUR NEGATIVE 10/14/2021 1154   BILIRUBINUR neg 04/12/2018 1006   KETONESUR 5 (A) 10/14/2021 1154   PROTEINUR 100 (A) 10/14/2021 1154   UROBILINOGEN 0.2 04/12/2018 1006   UROBILINOGEN 0.2 11/18/2014 2325   NITRITE NEGATIVE  10/14/2021 1154   LEUKOCYTESUR NEGATIVE 10/14/2021 1154    Radiological Exams on Admission: DG Chest 2 View  Result Date: 08/18/2022 CLINICAL DATA:  Shortness of breath EXAM: CHEST - 2 VIEW COMPARISON:  Radiograph 10/29/2021 FINDINGS: Tracheostomy tube overlies trachea. Unchanged mildly enlarged cardiac silhouette. No focal airspace consolidation. No pleural effusion or pneumothorax. No acute osseous abnormality. Thoracic spondylosis. Bilateral shoulder degenerative changes. IMPRESSION: No evidence of acute cardiopulmonary disease. Electronically Signed   By: Maurine Simmering M.D.   On: 08/18/2022 10:16    EKG: Independently reviewed.  Sinus rhythm at 90 bpm.  Nonspecific T wave changes, baseline wander.  Assessment/Plan Principal Problem:   AKI (acute kidney injury) ([place]) Active Problems:   OSA (obstructive sleep apnea)   Essential hypertension   Migraine   GERD (gastroesophageal reflux disease)   Depression   Morbid obesity with BMI of 40.0-44.9, adult (HCC)   Chronic pain syndrome   Hemoptysis   Generalized anxiety disorder   Tracheal stenosis   Normocytic anemia   Tracheostomy dependent (HCC)   Asthma   Insulin dependent type 2 diabetes mellitus (HCC)   Chronic diastolic heart failure (HCC)   Chronic respiratory failure with hypoxia (HCC)   AKI > Patient presenting> for hemoptysis as below.  Found to have creatinine elevated to 1.88 which is increased from her normal of around 1 just a few days ago. > She has had some nausea and vomiting and been feeling unwell with cough and hemoptysis with mildly volume down.  Also does take Lasix. > Received 500cc IV fluids in the ED.  - Monitor on telemetry overnight - Continue with IV fluids - Trend renal function and electrolytes  Hemoptysis Trach dependence Tracheal stenosis Subglottic stenosis > Patient reporting worsening hemoptysis for the past couple weeks.  This is in the setting of significant cough with a fever last  Friday. > Blood is around her trach site.  Most likely she is having some irritation about her trach site in the setting of frequent coughing due to likely viral illness given she has no leukocytosis. > She has extensive trach history with trach placed in 2018 due to suspected intubation related tracheal injury and then found to have laryngeal edema and trach placed and has been dependent since.  She has had multiple interventions on her trach due to issues with her tissue/anatomy and has a very high risk airway. > Due to her significant trach and procedure history have asked EDP to run her case by ENT to see if any additional  workup/imaging is warranted given increased bleeding. - Monitor on telemetry as above - Appreciate ENT input/recommendations - Trach care - Trend CBC ADDENDUM > ENT was consulted by EDP who upon review of patient's history and anatomy feel the that due to her significant subglottic stenosis she would be better served and monitored at [place]. - This evaluation is now considered consult - Appreciate EDP assistance in arranging transfer ADDENDUM 2 > No beds available at [facility], ENT there recommended our ENT do a scope to verify etiology of patient's bleeding. > Patient is to remain here under observation. > Dr. Benjamine Mola, will come by to evaluate patient this evening. - Proceed with observation on telemetry as above. - Given recent suspected viral illness checking respiratory viral panel as flu and COVID panel was negative  Asthma Chronic respiratory failure > Patient with known history of asthma and chronic respiratory failure but not on chronic oxygen.  With chronic trach as above. - Continue home [place] - Continue home as needed albuterol - Continue home Singulair  Chronic diastolic CHF > Last echo oh in our system was 2018 with EF 60-65%, G1 DD, normal RV function. - Holding home Lasix and lisinopril in the setting of AKI as  above  Hypertension - Holding home lisinopril, Lasix in the setting of AKI as above  Depression Anxiety - Continue home bupropion, sertraline, as needed hydroxyzine  Chronic pain - Continue home as needed oxycodone, Lyrica  Headaches - Continue home amitriptyline  GERD - Continue home PPI  Diabetes > 160 units daily of Tresiba at home. - Continue home Tresiba at 100 units daily - SSI  Anemia > Hemoglobin 10.8 on arrival which is near baseline of 11-12. - Trend CBC  OSA -Not on CPAP due to trach  Obesity - Noted  DVT prophylaxis: SCDs Code Status:   Full Family Communication:  Updated at bedside Disposition Plan:   Patient is from:  Home  Anticipated DC to:  Home  Anticipated DC date:  1 to 2 days  Anticipated DC barriers: None  Consults called:  ENT Admission status:  Transfer to outside facility

## 2022-08-18 NOTE — Consult Note (Signed)
Reason for Consult: Hemoptysis, subglottic stenosis  HPI:  [name] is an 51 y.o. female who presents to the [facility] ER for evaluation of her hemoptysis.  The patient has multiple medical problems, including severe asthma, chronic respiratory failure, chronic pain, diastolic CHF, hypertension, depression, anxiety, GERD, diabetes, obesity, trach dependence, anemia, OSA.  This is a recurring problem for the patient.  She was previously evaluated by ENT Dr. Melony Overly in December 2021 for similar issues.  Approximately 5 years ago, she was intubated, and following her intubation, she developed subglottic stenosis.This has been treated at [facility] numerous times and she is followed by an ENT at [facility].  She was admitted there for chronic coughing and some bleeding from the tracheostomy tube.  Currently she is able to talk around the trach.  She is having no bleeding presently although she is having some coughing.   Past Medical History:  Diagnosis Date   Abnormal UGI series    Acute pain of right shoulder due to trauma 10/05/2018   Acute respiratory failure with hypoxia and hypercapnia (HCC) 10/23/2018   Allergy    Anxiety    Arthritis    Asthma    Chest pain of uncertain etiology 82/70/7867   Chronic back pain    Chronic chest pain    resolved, no problems since 2019 per patient 07/27/19   Coffee ground vomiting 10/14/2021   COPD (chronic obstructive pulmonary disease) ([place])    Depression    DM (diabetes mellitus) ([place])    INSULIN DEPENDENT - TYPE 1   Dyspnea    GERD (gastroesophageal reflux disease)    Headache(784.0)    History of hiatal hernia    Hyperlipidemia    no med, diet controlled   Hypertension    Hypokalemia    Klebsiella cystitis 03/14/2017   Last Assessment & Plan:   Formatting of this note might be different from the original.  - On Zosyn  - Urine culture: Klebsiella pneumoniae   Pneumonia    Respiratory disease 05/2017   uses inhalers,  neb tx prn, no oxygen   Rotator cuff tear 07/28/2019   Sleep apnea    does not use CPAP due to trach   Status post tracheostomy ([place]) 03/14/2017   Last Assessment & Plan:  Formatting of this note might be different from the original. - [place] - On tach - ENT following along (please see above for current trach management plan)   Tracheostomy in place [facility]) 02/2017    Past Surgical History:  Procedure Laterality Date   ABDOMINAL HYSTERECTOMY  2009   APPENDECTOMY     BIOPSY  10/15/2021   Procedure: BIOPSY;  Surgeon: Thornton Park, MD;  Location: [place];  Service: Gastroenterology;;   CESAREAN SECTION     x 3   CHOLECYSTECTOMY N/A 03/02/2014   Procedure: LAPAROSCOPIC CHOLECYSTECTOMY;  Surgeon: Joyice Faster. Cornett, MD;  Location: [place];  Service: General;  Laterality: N/A;   ESOPHAGOGASTRODUODENOSCOPY (EGD) WITH PROPOFOL N/A 10/15/2021   Procedure: ESOPHAGOGASTRODUODENOSCOPY (EGD) WITH PROPOFOL;  Surgeon: Thornton Park, MD;  Location: [place];  Service: Gastroenterology;  Laterality: N/A;   HERNIA REPAIR     PANENDOSCOPY N/A 03/04/2017   Procedure: PANENDOSCOPY WITH POSSIBLE FOREIGN BODY REMOVAL;  Surgeon: Jodi Marble, MD;  Location: [place];  Service: ENT;  Laterality: N/A;   ROTATOR CUFF REPAIR Left    ROTATOR CUFF REPAIR Right 07/27/2019   SHOULDER ARTHROSCOPY WITH SUBACROMIAL DECOMPRESSION, ROTATOR CUFF REPAIR AND  BICEP TENDON REPAIR Right 07/28/2019   Procedure: RIGHT SHOULDER ARTHROSCOP, MINI OPEN ROTATOR CUFF TEAR REPAIR,  BICEPS TENODESIS, DISTAL CLAVICLE EXCISION;  Surgeon: Meredith Pel, MD;  Location: [place];  Service: Orthopedics;  Laterality: Right;   TOOTH EXTRACTION N/A 02/22/2021   Procedure: DENTAL RESTORATION/EXTRACTIONS;  Surgeon: Diona Browner, DMD;  Location: [facility];  Service: Oral Surgery;  Laterality: N/A;   TRACHEOSTOMY  02/2017   TUBAL LIGATION     VESICOVAGINAL FISTULA CLOSURE W/ TAH  2009    Family History   Problem Relation Age of Onset   Heart attack Mother    Stroke Mother    Diabetes Mother    Hypertension Mother    Arthritis Mother    Stroke Father    Asthma Sister    Hypertension Sister    Asthma Sister    Diabetes Sister    Asthma Brother    Seizures Brother    Asthma Brother    Diabetes Brother    Asthma Daughter    Asthma Son    Asthma Grandson    Colon cancer Neg Hx    Rectal cancer Neg Hx    Stomach cancer Neg Hx    Esophageal cancer Neg Hx    Ovarian cancer Neg Hx    Pancreatic cancer Neg Hx     Social History:  reports that she quit smoking about 6 years ago. Her smoking use included cigarettes. She has a 12.50 pack-year smoking history. She has never used smokeless tobacco. She reports current alcohol use. She reports that she does not currently use drugs after having used the following drugs: Marijuana.  Allergies:  Allergies  Allergen Reactions   Reglan [Metoclopramide] Other (See Comments)    Panic attack    Prior to Admission medications   Medication Sig Start Date End Date Taking? Authorizing Provider  albuterol (PROAIR HFA) 108 (90 Base) MCG/ACT inhaler Inhale 2 puffs into the lungs every 6 (six) hours as needed for wheezing or shortness of breath. 09/24/21  Yes [name], MD  albuterol (PROVENTIL) (2.5 MG/3ML) 0.083% nebulizer solution Take 3 mLs (2.5 mg total) by nebulization every 6 (six) hours as needed for wheezing or shortness of breath. 09/24/21  Yes [name], MD  amitriptyline (ELAVIL) 10 MG tablet Take 1 tablet (10 mg total) by mouth at bedtime. 07/09/22  Yes [name], MD  aspirin EC 81 MG EC tablet Take 1 tablet (81 mg total) by mouth daily. Swallow whole. 08/27/21  Yes Jessy Oto, MD  benzonatate (TESSALON) 100 MG capsule Take 1 capsule (100 mg total) by mouth 2 (two) times daily as needed for cough. 07/09/22  Yes [name], MD  buPROPion (WELLBUTRIN SR) 150 MG 12 hr tablet Take 1 tablet (150 mg total) by mouth 2 (two)  times daily. 04/08/22  Yes [name], MD  cetirizine (ZYRTEC) 10 MG tablet TAKE 1 TABLET BY MOUTH AT BEDTIME Patient taking differently: Take 10 mg by mouth at bedtime. 12/10/21  Yes Spero Geralds, MD  ciprofloxacin-dexamethasone [facility]) OTIC suspension SMARTSIG:In Ear(s) 05/03/22  Yes [provider]  dapagliflozin propanediol (FARXIGA) 10 MG TABS tablet Take 1 tablet (10 mg total) by mouth daily before breakfast. 07/09/22  Yes Newlin, Enobong, MD  diclofenac Sodium (VOLTAREN) 1 % GEL Apply 4 g topically 4 (four) times daily. 11/27/20  Yes [name], MD  furosemide (LASIX) 20 MG tablet Take 1 tablet (20 mg total) by mouth daily. 07/09/22  Yes [name], MD  hydrOXYzine (ATARAX)  25 MG tablet Take 1 tablet (25 mg total) by mouth 3 (three) times daily as needed. for anxiety 04/08/22  Yes Newlin, Enobong, MD  insulin degludec (TRESIBA FLEXTOUCH) 200 UNIT/ML FlexTouch Pen Inject 160 Units into the skin daily. 07/09/22  Yes Newlin, Charlane Ferretti, MD  lisinopril (ZESTRIL) 20 MG tablet Take 1 tablet (20 mg total) by mouth daily. 04/08/22  Yes Newlin, Charlane Ferretti, MD  melatonin 3 MG TABS tablet Take 1 tablet (3 mg total) by mouth at bedtime. 06/20/22  Yes Newlin, Charlane Ferretti, MD  mometasone-formoterol (DULERA) 200-5 MCG/ACT AERO Inhale 2 puffs into the lungs in the morning and at bedtime. 07/09/22  Yes Newlin, Charlane Ferretti, MD  montelukast (SINGULAIR) 10 MG tablet Take 1 tablet (10 mg total) by mouth at bedtime. 07/09/22  Yes Newlin, Charlane Ferretti, MD  ondansetron (ZOFRAN) 4 MG tablet Take 1 tablet (4 mg total) by mouth every 8 (eight) hours as needed for nausea or vomiting. 07/07/22  Yes [name], MD  oxyCODONE-acetaminophen (PERCOCET) 10-325 MG tablet Take 1 tablet by mouth 5 (five) times daily as needed. 11/22/21  Yes [provider]  pantoprazole (PROTONIX) 40 MG tablet Take 1 tablet (40 mg total) by mouth 2 (two) times daily. 07/09/22  Yes [name], MD  pregabalin (LYRICA) 50 MG  capsule Take 1 capsule (50 mg total) by mouth in the morning, at noon, and at bedtime. 06/16/22  Yes Jessy Oto, MD  rosuvastatin (CRESTOR) 20 MG tablet Take 1 tablet (20 mg total) by mouth daily. 07/10/22  Yes Newlin, Charlane Ferretti, MD  Semaglutide, 2 MG/DOSE, 8 MG/3ML SOPN Inject 2 mg as directed once a week. After completion of 4-week course of 1 mg dose 04/08/22  Yes Newlin, Enobong, MD  sertraline (ZOLOFT) 100 MG tablet Take 1 tablet (100 mg total) by mouth at bedtime. 04/08/22  Yes [name], MD  tiZANidine (ZANAFLEX) 4 MG tablet Take 1 tablet (4 mg total) by mouth every 8 (eight) hours as needed for muscle spasms. 07/07/22  Yes [name], MD  Vitamin D, Ergocalciferol, (DRISDOL) 1.25 MG (50000 UNIT) CAPS capsule Take 1 capsule (50,000 Units total) by mouth every 7 (seven) days. 07/07/22  Yes Jessy Oto, MD  Blood Glucose Monitoring Suppl (ONETOUCH VERIO) w/Device KIT 1 each by Does not apply route daily. Use to monitor glucose levels daily; E11.9 06/20/22   Elayne Snare, MD  buprenorphine-naloxone (SUBOXONE) 2-0.5 mg SUBL SL tablet Place 1 tablet under the tongue 2 (two) times daily. Patient not taking: Reported on 08/18/2022 03/19/22   [provider]  Continuous Blood Gluc Receiver (FREESTYLE LIBRE 2 READER) DEVI Use to check blood sugar three times daily. E11.9 07/09/22   [name], MD  Continuous Blood Gluc Sensor (FREESTYLE LIBRE 2 SENSOR) MISC Use to check blood sugar three times daily. Change sensor once every 14 days. E11.9 07/09/22   [name], MD  gabapentin (NEURONTIN) 600 MG tablet Take 600 mg by mouth 4 (four) times daily. Patient not taking: Reported on 08/18/2022 06/04/22   [provider]  glucose blood (ONETOUCH VERIO) test strip Use to check glucose levels TID. 06/20/22   [name], MD  Insulin Pen Needle 31G X 8 MM MISC Inject 1 Units as directed daily. 07/30/18   [provider]  Misc. Devices MISC Nebulizer device. Diagnosis  - Asthma 07/16/20   [name], MD  Misc. Devices MISC Shower chair 04/03/21   [name], MD  naproxen (NAPROSYN) 500 MG tablet Take 500 mg by mouth 2 (two) times  daily as needed. Patient not taking: Reported on 08/18/2022 08/11/22   [provider]  OneTouch Delica Lancets 64Q MISC 1 each by Does not apply route daily. Use to monitor glucose levels daily; E11.9 04/14/19   Renato Shin, MD  fluticasone-salmeterol (ADVAIR HFA) [id number] MCG/ACT inhaler Inhale 2 puffs into the lungs 2 (two) times daily. 10/15/17 10/15/17  [name], MD     Results for orders placed or performed during the hospital encounter of 08/18/22 (from the past 48 hour(s))  CBC with Differential     Status: Abnormal   Collection Time: 08/18/22  9:54 AM  Result Value Ref Range   WBC 8.0 4.0 - 10.5 K/uL   RBC 3.79 (L) 3.87 - 5.11 MIL/uL   Hemoglobin 10.8 (L) 12.0 - 15.0 g/dL   HCT 34.4 (L) 36.0 - 46.0 %   MCV 90.8 80.0 - 100.0 fL   MCH 28.5 26.0 - 34.0 pg   MCHC 31.4 30.0 - 36.0 g/dL   RDW 14.7 11.5 - 15.5 %   Platelets 353 150 - 400 K/uL   nRBC 0.0 0.0 - 0.2 %   Neutrophils Relative % 37 %   Neutro Abs 2.9 1.7 - 7.7 K/uL   Lymphocytes Relative 54 %   Lymphs Abs 4.4 (H) 0.7 - 4.0 K/uL   Monocytes Relative 7 %   Monocytes Absolute 0.6 0.1 - 1.0 K/uL   Eosinophils Relative 1 %   Eosinophils Absolute 0.1 0.0 - 0.5 K/uL   Basophils Relative 1 %   Basophils Absolute 0.0 0.0 - 0.1 K/uL   Immature Granulocytes 0 %   Abs Immature Granulocytes 0.02 0.00 - 0.07 K/uL    Comment: Performed at [facility], [address] [place], [place] [address]  Brain natriuretic peptide     Status: None   Collection Time: 08/18/22  9:54 AM  Result Value Ref Range   B Natriuretic Peptide 3.1 0.0 - 100.0 pg/mL    Comment: Performed at [facility] [address] [place], [place] [address]  Comprehensive metabolic panel     Status: Abnormal   Collection Time: 08/18/22  9:54 AM  Result Value Ref  Range   Sodium 138 135 - 145 mmol/L   Potassium 4.0 3.5 - 5.1 mmol/L   Chloride 107 98 - 111 mmol/L   CO2 23 22 - 32 mmol/L   Glucose, Bld 225 (H) 70 - 99 mg/dL    Comment: Glucose reference range applies only to samples taken after fasting for at least 8 hours.   BUN 28 (H) 6 - 20 mg/dL   Creatinine, Ser 1.88 (H) 0.44 - 1.00 mg/dL   Calcium 8.5 (L) 8.9 - 10.3 mg/dL   Total Protein 7.0 6.5 - 8.1 g/dL   Albumin 3.6 3.5 - 5.0 g/dL   AST 26 15 - 41 U/L   ALT 26 0 - 44 U/L   Alkaline Phosphatase 105 38 - 126 U/L   Total Bilirubin 0.5 0.3 - 1.2 mg/dL   GFR, Estimated 32 (L) >60 mL/min    Comment: (NOTE) Calculated using the CKD-EPI Creatinine Equation (2021)    Anion gap 8 5 - 15    Comment: Performed at [facility] [address], [place], [place] [address]  Resp Panel by RT-PCR (Flu A&B, Covid) Anterior Nasal Swab     Status: None   Collection Time: 08/18/22 10:42 AM   Specimen: Anterior Nasal Swab  Result Value Ref Range   SARS  Coronavirus 2 by RT PCR NEGATIVE NEGATIVE    Comment: (NOTE) SARS-CoV-2 target nucleic acids are NOT DETECTED.  The SARS-CoV-2 RNA is generally detectable in upper respiratory specimens during the acute phase of infection. The lowest concentration of SARS-CoV-2 viral copies this assay can detect is 138 copies/mL. A negative result does not preclude SARS-Cov-2 infection and should not be used as the sole basis for treatment or other patient management decisions. A negative result may occur with  improper specimen collection/handling, submission of specimen other than nasopharyngeal swab, presence of viral mutation(s) within the areas targeted by this assay, and inadequate number of viral copies(<138 copies/mL). A negative result must be combined with clinical observations, patient history, and epidemiological information. The expected result is Negative.  Fact Sheet for Patients:  EntrepreneurPulse.com.au  Fact Sheet for  Healthcare Providers:  IncredibleEmployment.be  This test is no t yet approved or cleared by the [place] FDA and  has been authorized for detection and/or diagnosis of SARS-CoV-2 by FDA under an Emergency Use Authorization (EUA). This EUA will remain  in effect (meaning this test can be used) for the duration of the COVID-19 declaration under Section 564(b)(1) of the Act, 21 U.S.C.section 360bbb-3(b)(1), unless the authorization is terminated  or revoked sooner.       Influenza A by PCR NEGATIVE NEGATIVE   Influenza B by PCR NEGATIVE NEGATIVE    Comment: (NOTE) The Xpert Xpress SARS-CoV-2/FLU/RSV plus assay is intended as an aid in the diagnosis of influenza from Nasopharyngeal swab specimens and should not be used as a sole basis for treatment. Nasal washings and aspirates are unacceptable for Xpert Xpress SARS-CoV-2/FLU/RSV testing.  Fact Sheet for Patients: EntrepreneurPulse.com.au  Fact Sheet for Healthcare Providers: IncredibleEmployment.be  This test is not yet approved or cleared by the [place] FDA and has been authorized for detection and/or diagnosis of SARS-CoV-2 by FDA under an Emergency Use Authorization (EUA). This EUA will remain in effect (meaning this test can be used) for the duration of the COVID-19 declaration under Section 564(b)(1) of the Act, 21 U.S.C. section 360bbb-3(b)(1), unless the authorization is terminated or revoked.  Performed at [facility], [place] [address], [place], [place] [address]   CBG monitoring, ED     Status: None   Collection Time: 08/18/22  5:39 PM  Result Value Ref Range   Glucose-Capillary 84 70 - 99 mg/dL    Comment: Glucose reference range applies only to samples taken after fasting for at least 8 hours.   *Note: Due to a large number of results and/or encounters for the requested time period, some results have not been displayed. A complete set of results  can be found in Results Review.    DG Chest 2 View  Result Date: 08/18/2022 CLINICAL DATA:  Shortness of breath EXAM: CHEST - 2 VIEW COMPARISON:  Radiograph 10/29/2021 FINDINGS: Tracheostomy tube overlies trachea. Unchanged mildly enlarged cardiac silhouette. No focal airspace consolidation. No pleural effusion or pneumothorax. No acute osseous abnormality. Thoracic spondylosis. Bilateral shoulder degenerative changes. IMPRESSION: No evidence of acute cardiopulmonary disease. Electronically Signed   By: Maurine Simmering M.D.   On: 08/18/2022 10:16    Review of Systems:  As per HPI otherwise all other systems reviewed and are negative.   Blood pressure 139/89, pulse 90, temperature 98.3 F (36.8 C), temperature source Oral, resp. rate 15, height _0  (1.549 m), weight 112.5 kg, SpO2 99 %. General appearance: alert and cooperative Head: Normocephalic, without obvious abnormality, atraumatic Eyes: Pupils  are equal, round, reactive to light. Extraocular motion is intact.  Ears: Examination of the ears shows normal auricles and external auditory canals bilaterally. Nose: Nasal examination shows normal mucosa, septum, turbinates.  Face: Facial examination shows no asymmetry. Palpation of the face elicit no significant tenderness.  Mouth: Oral cavity examination shows no mucosal abnormalities. No significant trismus is noted.  Neck: The tracheostomy tube is in place and patent.  No acute bleeding is noted.  Procedure:  Flexible Fiberoptic tracheoscopy Anesthesia: None Indication: Hemoptysis Description: Risks, benefits, and alternatives of flexible endoscopy were explained to the patient.  The patient gave oral consent to proceed.  Fiberoptic tracheo-bronchoscopy was performed with a fiberoptic laryngoscope through the tracheostomy opening.  The tracheostomy is well within the lumen of the trachea.  The trachea is clear distal to the tracheostomy.  The carina can be easily visualized with no stenosis  distal to the tracheostomy and normal appearing trachea mucosa.  The mainstem bronchi are also normal.  Assessment/Plan: The source of bleeding is most likely in the subglottic area where she has had chronic problems and would recommend follow-up with her physicians at [facility] for further evaluation and treatment. There is no obvious source of bleeding or infection distal to the present tracheostomy.  Patient is having no airway problems presently.  Jessamyn Watterson W Donie Lemelin 08/18/2022, 5:56 PM

## 2022-08-18 NOTE — ED Triage Notes (Signed)
Pt arrived POV from home with a chronic trach in place c/o coughing up blood x1 week. Pt states it is coming out of her trach and around her trach. Pt states she cannot lay down without having to wake up coughing and feeling like she is choking.

## 2022-08-18 NOTE — ED Provider Notes (Signed)
[facility] EMERGENCY DEPARTMENT Provider Note   CSN: [id number] Arrival date & time: 08/18/22  4627     History  Chief Complaint  Patient presents with   Hemoptysis    [name] is a 51 y.o. female.  The history is provided by the patient and medical records.     Patient is a 51 year old female with history of tracheostomy, asthma, COPD presenting to ED with CC hemoptysis. Patient states symptoms began 2 weeks ago and have been worsening since. Patient has had to increase frequency of suctioning due to volume of blood. Patient reports chest pain and abdominal pain associated with coughing. Pain does not occur at rest. Patient reports 101*F lasting for one day this previous Friday. Patient takes oxycodone for chronic pain, but it does not ameliorate present pain. Pain aggravated by laying supine. Patient reports rhinorrhea, headache. Patient denies nausea, vomiting, stool changes, urinary changes, mental status changes.  Home Medications Prior to Admission medications   Medication Sig Start Date End Date Taking? Authorizing Provider  albuterol (PROAIR HFA) 108 (90 Base) MCG/ACT inhaler Inhale 2 puffs into the lungs every 6 (six) hours as needed for wheezing or shortness of breath. 09/24/21   [name], MD  albuterol (PROVENTIL) (2.5 MG/3ML) 0.083% nebulizer solution Take 3 mLs (2.5 mg total) by nebulization every 6 (six) hours as needed for wheezing or shortness of breath. 09/24/21   [name], MD  amitriptyline (ELAVIL) 10 MG tablet Take 1 tablet (10 mg total) by mouth at bedtime. 07/09/22   [name], MD  aspirin EC 81 MG EC tablet Take 1 tablet (81 mg total) by mouth daily. Swallow whole. 08/27/21   Jessy Oto, MD  benzonatate (TESSALON) 100 MG capsule Take 1 capsule (100 mg total) by mouth 2 (two) times daily as needed for cough. 07/09/22   [name], MD  Blood Glucose Monitoring Suppl (ONETOUCH VERIO) w/Device KIT 1 each  by Does not apply route daily. Use to monitor glucose levels daily; E11.9 06/20/22   Elayne Snare, MD  buPROPion [facility] SR) 150 MG 12 hr tablet Take 1 tablet (150 mg total) by mouth 2 (two) times daily. 04/08/22   [name], MD  cetirizine (ZYRTEC) 10 MG tablet TAKE 1 TABLET BY MOUTH AT BEDTIME 12/10/21   Spero Geralds, MD  Continuous Blood Gluc Receiver (FREESTYLE LIBRE 2 READER) DEVI Use to check blood sugar three times daily. E11.9 07/09/22   [name], MD  Continuous Blood Gluc Sensor (FREESTYLE LIBRE 2 SENSOR) MISC Use to check blood sugar three times daily. Change sensor once every 14 days. E11.9 07/09/22   [name], MD  dapagliflozin propanediol (FARXIGA) 10 MG TABS tablet Take 1 tablet (10 mg total) by mouth daily before breakfast. 07/09/22   [name], MD  diclofenac Sodium (VOLTAREN) 1 % GEL Apply 4 g topically 4 (four) times daily. 11/27/20   [name], MD  furosemide (LASIX) 20 MG tablet Take 1 tablet (20 mg total) by mouth daily. 07/09/22   [name], MD  glucose blood (ONETOUCH VERIO) test strip Use to check glucose levels TID. 06/20/22   [name], MD  hydrOXYzine (ATARAX) 25 MG tablet Take 1 tablet (25 mg total) by mouth 3 (three) times daily as needed. for anxiety 04/08/22   [name], MD  insulin degludec (TRESIBA FLEXTOUCH) 200 UNIT/ML FlexTouch Pen Inject 160 Units into the skin daily. 07/09/22   [name], MD  Insulin Pen Needle 31G X 8  MM MISC Inject 1 Units as directed daily. 07/30/18   [provider]  lisinopril (ZESTRIL) 20 MG tablet Take 1 tablet (20 mg total) by mouth daily. 04/08/22   [name], MD  melatonin 3 MG TABS tablet Take 1 tablet (3 mg total) by mouth at bedtime. 06/20/22   [name], MD  Misc. Devices MISC Nebulizer device. Diagnosis - Asthma 07/16/20   [name], MD  Misc. Devices Oasis chair 04/03/21   [name], MD  Misc. Devices MISC Bedside commode. Diagnosis- low back  pain 09/02/21   [name], MD  Misc. Devices [employer].  Diagnosis-low back pain 06/20/22   [name], MD  Misc. Devices [employer].  Diagnosis-low back pain 06/20/22   [name], MD  mometasone-formoterol (DULERA) 200-5 MCG/ACT AERO Inhale 2 puffs into the lungs in the morning and at bedtime. 07/09/22   [name], MD  montelukast (SINGULAIR) 10 MG tablet Take 1 tablet (10 mg total) by mouth at bedtime. 07/09/22   [name], MD  ondansetron (ZOFRAN) 4 MG tablet Take 1 tablet (4 mg total) by mouth every 8 (eight) hours as needed for nausea or vomiting. 07/07/22   [name], MD  OneTouch Delica Lancets 83M MISC 1 each by Does not apply route daily. Use to monitor glucose levels daily; E11.9 04/14/19   Renato Shin, MD  oxyCODONE-acetaminophen (PERCOCET) 10-325 MG tablet Take 1 tablet by mouth 5 (five) times daily as needed. 11/22/21   [provider]  pantoprazole (PROTONIX) 40 MG tablet Take 1 tablet (40 mg total) by mouth 2 (two) times daily. 07/09/22   [name], MD  pregabalin (LYRICA) 50 MG capsule Take 1 capsule (50 mg total) by mouth in the morning, at noon, and at bedtime. 06/16/22   Jessy Oto, MD  rosuvastatin (CRESTOR) 20 MG tablet Take 1 tablet (20 mg total) by mouth daily. 07/10/22   [name], MD  Semaglutide, 2 MG/DOSE, 8 MG/3ML SOPN Inject 2 mg as directed once a week. After completion of 4-week course of 1 mg dose 04/08/22   [name], MD  sertraline (ZOLOFT) 100 MG tablet Take 1 tablet (100 mg total) by mouth at bedtime. 04/08/22   [name], MD  tiZANidine (ZANAFLEX) 4 MG tablet Take 1 tablet (4 mg total) by mouth every 8 (eight) hours as needed for muscle spasms. 07/07/22   [name], MD  Vitamin D, Ergocalciferol, (DRISDOL) 1.25 MG (50000 UNIT) CAPS capsule Take 1 capsule (50,000 Units total) by mouth every 7 (seven) days. 07/07/22   Jessy Oto, MD  fluticasone-salmeterol (ADVAIR HFA) [phone] MCG/ACT inhaler  Inhale 2 puffs into the lungs 2 (two) times daily. 10/15/17 10/15/17  [name], MD      Allergies    Reglan [metoclopramide]    Review of Systems   Review of Systems  All other systems reviewed and are negative.   Physical Exam Updated Vital Signs BP 132/89 (BP Location: Right Wrist)   Pulse 100   Temp 98.3 F (36.8 C) (Oral)   Resp 20   Ht _0  (1.549 m)   Wt 112.5 kg   LMP  (LMP Unknown)   SpO2 95%   BMI 46.86 kg/m  Physical Exam Vitals and nursing note reviewed.  Constitutional:      General: She is not in acute distress.    Appearance: She is well-developed.  HENT:     Head: Atraumatic.  Eyes:     Conjunctiva/sclera: Conjunctivae normal.  Neck:  Comments: Has trache Cardiovascular:     Rate and Rhythm: Normal rate and regular rhythm.  Pulmonary:     Effort: Pulmonary effort is normal.     Breath sounds: Rhonchi present. No rales.  Abdominal:     Palpations: Abdomen is soft.     Tenderness: There is no abdominal tenderness.  Musculoskeletal:     Cervical back: Neck supple.  Skin:    Findings: No rash.  Neurological:     Mental Status: She is alert.  Psychiatric:        Mood and Affect: Mood normal.     ED Results / Procedures / Treatments   Labs (all labs ordered are listed, but only abnormal results are displayed) Labs Reviewed  CBC WITH DIFFERENTIAL/PLATELET - Abnormal; Notable for the following components:      Result Value   RBC 3.79 (*)    Hemoglobin 10.8 (*)    HCT 34.4 (*)    Lymphs Abs 4.4 (*)    All other components within normal limits  COMPREHENSIVE METABOLIC PANEL - Abnormal; Notable for the following components:   Glucose, Bld 225 (*)    BUN 28 (*)    Creatinine, Ser 1.88 (*)    Calcium 8.5 (*)    GFR, Estimated 32 (*)    All other components within normal limits  RESP PANEL BY RT-PCR (FLU A&B, COVID) [id number]  BRAIN NATRIURETIC PEPTIDE    EKG EKG Interpretation  Date/Time:  Monday August 18 2022 09:57:50  EST Ventricular Rate:  90 PR Interval:  158 QRS Duration: 82 QT Interval:  402 QTC Calculation: 491 R Axis:   57 Text Interpretation: Normal sinus rhythm Cannot rule out Anterior infarct , age undetermined Abnormal ECG Confirmed by Godfrey Pick [phone]) on 08/18/2022 10:03:25 AM  Radiology DG Chest 2 View  Result Date: 08/18/2022 CLINICAL DATA:  Shortness of breath EXAM: CHEST - 2 VIEW COMPARISON:  Radiograph 10/29/2021 FINDINGS: Tracheostomy tube overlies trachea. Unchanged mildly enlarged cardiac silhouette. No focal airspace consolidation. No pleural effusion or pneumothorax. No acute osseous abnormality. Thoracic spondylosis. Bilateral shoulder degenerative changes. IMPRESSION: No evidence of acute cardiopulmonary disease. Electronically Signed   By: Maurine Simmering M.D.   On: 08/18/2022 10:16    Procedures Procedures    Medications Ordered in ED Medications  rosuvastatin (CRESTOR) tablet 20 mg (has no administration in time range)  pantoprazole (PROTONIX) EC tablet 40 mg (has no administration in time range)  albuterol (PROVENTIL) (2.5 MG/3ML) 0.083% nebulizer solution 2.5 mg (has no administration in time range)  mometasone-formoterol (DULERA) 200-5 MCG/ACT inhaler 2 puff (has no administration in time range)  montelukast (SINGULAIR) tablet 10 mg (has no administration in time range)  sodium chloride flush (NS) 0.9 % injection 3 mL (has no administration in time range)  acetaminophen (TYLENOL) tablet 650 mg (has no administration in time range)    Or  acetaminophen (TYLENOL) suppository 650 mg (has no administration in time range)  polyethylene glycol (MIRALAX / GLYCOLAX) packet 17 g (has no administration in time range)  0.9 %  sodium chloride infusion (has no administration in time range)  HYDROcodone bit-homatropine (HYCODAN) 5-1.5 MG/5ML syrup 5 mL (5 mLs Oral Given 08/18/22 1104)  sodium chloride 0.9 % bolus 500 mL (500 mLs Intravenous New Bag/Given 08/18/22 1206)    ED Course/  Medical Decision Making/ A&P                           Medical Decision  Making Risk Prescription drug management. Decision regarding hospitalization.   BP 132/89 (BP Location: Right Wrist)   Pulse 100   Temp 98.3 F (36.8 C) (Oral)   Resp 20   Ht _0  (1.549 m)   Wt 112.5 kg   LMP  (LMP Unknown)   SpO2 95%   BMI 46.86 kg/m   27:42 AM 51 year old female significant history of tracheostomy dependent, COPD, anxiety, CHF, diabetes, asthma who presents to complaining of coughing up blood.  She states that she has had hemoptysis from her tracheostomy starting 10 days ago.  Initially, blood was bright red but has now become more dark in color.  Also endorses chest pain and shortness of breath.  Chest pain is midsternal and nonradiating.  She was seen in the ED 2 days ago.  At that time, she did not endorse any recent hemoptysis.  She was seen for epigastric pain and treated for GERD.  She did undergo CTA dissection study at that time which was negative for acute findings.  Today she reports symptoms are persistent, she is having to perform oral suctioning at home.  States cough is painful.  She did endorse subjective fever a few days prior but no congestion sneezing or sore throat.  Denies any bleeding coming from her mouth.  She is not on any blood thinner medication.  On exam this is an obese female sitting in the bed appears mildly uncomfortable.  She does have a tracheostomy tube in place.  Mouth exam unremarkable, no signs of bleeding around the tracheostomy site although I do see trace blood on her trach stylet.  Heart with mild tachycardia, lung with scattered rhonchi.  Abdomen is soft and nontender.  Patient did mention she did have some bouts of nausea vomiting and previously but states her bloody cough is coming from coughing and not from vomiting.  -Labs ordered, independently viewed and interpreted by me.  Labs remarkable for hgb 10.8, it was 12.5 from 2 days ago.  Also evidence of  acute kidney injury with BUN 28 and Cr 1.88.  It was within normal range 2 days prior -The patient was maintained on a cardiac monitor.  I personally viewed and interpreted the cardiac monitored which showed an underlying rhythm of: NSR -Imaging independently viewed and interpreted by me and I agree with radiologist's interpretation.  Result remarkable for CXR showing no acute finding.  I have also reviewed patient chest abdomen pelvis CTA that was performed 2 days prior.  At that time no obvious evidence concerning for dissection, PE, or perforation. -This patient presents to the ED for concern of hemoptysis, this involves an extensive number of treatment options, and is a complaint that carries with it a high risk of complications and morbidity.  The differential diagnosis includes tracheal bleeding, bronchiectasis, bronchitis, PE, Upper GIB, pna, malignancy -Co morbidities that complicate the patient evaluation includes tracheostomy, COPD, asthma, CHF -Treatment includes hycodan, IVF, zofran -Reevaluation of the patient after these medicines showed that the patient improved -PCP office notes or outside notes reviewed -Discussion with [facility] Dr. Trilby Drummer who agrees to admit pt but request ENT to weigh in as well -Escalation to admission/observation considered: patients feels comfortable with observation admission  1:06 PM I have consulted with ENT on call Dr. Benjamine Mola who will evaluate pt later on today, and anticipate scoping her trache to help identify the source of the bleed.    I appreciate consultation from [facility] ENT specialist Dr. Herma Ard who recommend our ENT to  perform an endoscope procedure as her sxs is likely due to tracheitis.  Pt should be on humidifying air and can be f/u outpt with ENT Dr. Nevada Crane.  If finding from the scope shows more concerning finding then [facility] ENT can be consulted again.          Final Clinical Impression(s) / ED Diagnoses Final diagnoses:   Hemoptysis  AKI (acute kidney injury) [facility])    Rx / DC Orders ED Discharge Orders     None         Domenic Moras, PA-C 08/18/22 1514    Godfrey Pick, MD 08/18/22 1642

## 2022-08-18 NOTE — ED Provider Triage Note (Signed)
Emergency Medicine Provider Triage Evaluation Note  [name] , a 51 y.o. female  was evaluated in triage.  Pt complains of hemoptysis.  Started 10 days ago.  Unsure what may have caused it.  The blood was bright red but now more dark in color.  States that her bleeding is worse with coughing.  Now endorsing shortness of breath and chest pain.  Pain is midsternally and nonradiating.  Worse with coughing.  Review of Systems  Positive: See above Negative: See above  Physical Exam  BP 132/89 (BP Location: Right Wrist)   Pulse 100   Temp 98.3 F (36.8 C) (Oral)   Resp 20   Ht '5\' 1"'$  (1.549 m)   Wt 112.5 kg   LMP  (LMP Unknown)   SpO2 95%   BMI 46.86 kg/m  Gen:   Awake, no distress   Resp:  Labored, rhonchorous, diminished MSK:   Moves extremities without difficulty  Other:    Medical Decision Making  Medically screening exam initiated at 9:42 AM.  Appropriate orders placed.  [name] was informed that the remainder of the evaluation will be completed by another provider, this initial triage assessment does not replace that evaluation, and the importance of remaining in the ED until their evaluation is complete.  Work up initiated   Harriet Pho, PA-C 08/18/22 0945

## 2022-08-18 NOTE — ED Notes (Signed)
Respiratory at bedside and suction has been set up for comfort; Patient uses yonker on her own.-Monique,RN

## 2022-08-18 NOTE — ED Notes (Addendum)
NA

## 2022-08-19 ENCOUNTER — Ambulatory Visit: Payer: Self-pay | Admitting: *Deleted

## 2022-08-19 DIAGNOSIS — F411 Generalized anxiety disorder: Secondary | ICD-10-CM | POA: Diagnosis present

## 2022-08-19 DIAGNOSIS — M549 Dorsalgia, unspecified: Secondary | ICD-10-CM | POA: Diagnosis present

## 2022-08-19 DIAGNOSIS — J9611 Chronic respiratory failure with hypoxia: Secondary | ICD-10-CM | POA: Diagnosis not present

## 2022-08-19 DIAGNOSIS — Z8616 Personal history of COVID-19: Secondary | ICD-10-CM | POA: Diagnosis not present

## 2022-08-19 DIAGNOSIS — E119 Type 2 diabetes mellitus without complications: Secondary | ICD-10-CM | POA: Diagnosis not present

## 2022-08-19 DIAGNOSIS — Z794 Long term (current) use of insulin: Secondary | ICD-10-CM | POA: Diagnosis not present

## 2022-08-19 DIAGNOSIS — Z8249 Family history of ischemic heart disease and other diseases of the circulatory system: Secondary | ICD-10-CM | POA: Diagnosis not present

## 2022-08-19 DIAGNOSIS — J0410 Acute tracheitis without obstruction: Secondary | ICD-10-CM | POA: Diagnosis present

## 2022-08-19 DIAGNOSIS — E1142 Type 2 diabetes mellitus with diabetic polyneuropathy: Secondary | ICD-10-CM | POA: Diagnosis not present

## 2022-08-19 DIAGNOSIS — Z87891 Personal history of nicotine dependence: Secondary | ICD-10-CM | POA: Diagnosis not present

## 2022-08-19 DIAGNOSIS — Z79899 Other long term (current) drug therapy: Secondary | ICD-10-CM | POA: Diagnosis not present

## 2022-08-19 DIAGNOSIS — J386 Stenosis of larynx: Secondary | ICD-10-CM | POA: Diagnosis not present

## 2022-08-19 DIAGNOSIS — I110 Hypertensive heart disease with heart failure: Secondary | ICD-10-CM | POA: Diagnosis not present

## 2022-08-19 DIAGNOSIS — R042 Hemoptysis: Secondary | ICD-10-CM | POA: Diagnosis not present

## 2022-08-19 DIAGNOSIS — Z6841 Body Mass Index (BMI) 40.0 and over, adult: Secondary | ICD-10-CM | POA: Diagnosis not present

## 2022-08-19 DIAGNOSIS — I5032 Chronic diastolic (congestive) heart failure: Secondary | ICD-10-CM | POA: Diagnosis not present

## 2022-08-19 DIAGNOSIS — I10 Essential (primary) hypertension: Secondary | ICD-10-CM | POA: Diagnosis not present

## 2022-08-19 DIAGNOSIS — E785 Hyperlipidemia, unspecified: Secondary | ICD-10-CM | POA: Diagnosis present

## 2022-08-19 DIAGNOSIS — G43909 Migraine, unspecified, not intractable, without status migrainosus: Secondary | ICD-10-CM | POA: Diagnosis present

## 2022-08-19 DIAGNOSIS — N179 Acute kidney failure, unspecified: Secondary | ICD-10-CM | POA: Diagnosis not present

## 2022-08-19 DIAGNOSIS — J9501 Hemorrhage from tracheostomy stoma: Secondary | ICD-10-CM | POA: Diagnosis not present

## 2022-08-19 DIAGNOSIS — G4733 Obstructive sleep apnea (adult) (pediatric): Secondary | ICD-10-CM | POA: Diagnosis present

## 2022-08-19 DIAGNOSIS — E1165 Type 2 diabetes mellitus with hyperglycemia: Secondary | ICD-10-CM | POA: Diagnosis not present

## 2022-08-19 DIAGNOSIS — K219 Gastro-esophageal reflux disease without esophagitis: Secondary | ICD-10-CM | POA: Diagnosis present

## 2022-08-19 DIAGNOSIS — G894 Chronic pain syndrome: Secondary | ICD-10-CM | POA: Diagnosis present

## 2022-08-19 DIAGNOSIS — F32A Depression, unspecified: Secondary | ICD-10-CM | POA: Diagnosis not present

## 2022-08-19 LAB — CBC
HCT: 37.2 % (ref 36.0–46.0)
Hemoglobin: 11.8 g/dL — ABNORMAL LOW (ref 12.0–15.0)
MCH: 28.4 pg (ref 26.0–34.0)
MCHC: 31.7 g/dL (ref 30.0–36.0)
MCV: 89.6 fL (ref 80.0–100.0)
Platelets: 380 10*3/uL (ref 150–400)
RBC: 4.15 MIL/uL (ref 3.87–5.11)
RDW: 14.6 % (ref 11.5–15.5)
WBC: 7.5 10*3/uL (ref 4.0–10.5)
nRBC: 0 % (ref 0.0–0.2)

## 2022-08-19 LAB — TROPONIN I (HIGH SENSITIVITY)
Troponin I (High Sensitivity): 5 ng/L (ref <18)
Troponin I (High Sensitivity): 4 ng/L (ref <18)

## 2022-08-19 LAB — CBG MONITORING, ED
Glucose-Capillary: 200 mg/dL — ABNORMAL HIGH (ref 70–99)
Glucose-Capillary: 216 mg/dL — ABNORMAL HIGH (ref 70–99)

## 2022-08-19 LAB — COMPREHENSIVE METABOLIC PANEL
ALT: 27 U/L (ref 0–44)
AST: 25 U/L (ref 15–41)
Albumin: 3.5 g/dL (ref 3.5–5.0)
Alkaline Phosphatase: 111 U/L (ref 38–126)
Anion gap: 8 (ref 5–15)
BUN: 16 mg/dL (ref 6–20)
CO2: 25 mmol/L (ref 22–32)
Calcium: 8.8 mg/dL — ABNORMAL LOW (ref 8.9–10.3)
Chloride: 106 mmol/L (ref 98–111)
Creatinine, Ser: 1.17 mg/dL — ABNORMAL HIGH (ref 0.44–1.00)
GFR, Estimated: 56 mL/min — ABNORMAL LOW (ref >60)
Glucose, Bld: 165 mg/dL — ABNORMAL HIGH (ref 70–99)
Potassium: 4 mmol/L (ref 3.5–5.1)
Sodium: 139 mmol/L (ref 135–145)
Total Bilirubin: 0.3 mg/dL (ref 0.3–1.2)
Total Protein: 7.6 g/dL (ref 6.5–8.1)

## 2022-08-19 LAB — RESPIRATORY PANEL BY PCR

## 2022-08-19 LAB — GLUCOSE, CAPILLARY
Glucose-Capillary: 155 mg/dL — ABNORMAL HIGH (ref 70–99)
Glucose-Capillary: 118 mg/dL — ABNORMAL HIGH (ref 70–99)
Glucose-Capillary: 160 mg/dL — ABNORMAL HIGH (ref 70–99)

## 2022-08-19 LAB — D-DIMER, QUANTITATIVE: D-Dimer, Quant: 0.65 ug/mL-FEU — ABNORMAL HIGH (ref 0.00–0.50)

## 2022-08-19 MED ORDER — NITROGLYCERIN 0.4 MG SL SUBL
0.4000 mg | SUBLINGUAL_TABLET | SUBLINGUAL | Status: DC | PRN
  Administered 2022-08-19 (×2): 0.4 mg via SUBLINGUAL
  Filled 2022-08-19: qty 1

## 2022-08-19 MED ORDER — AMOXICILLIN-POT CLAVULANATE 875-125 MG PO TABS
1.0000 | ORAL_TABLET | Freq: Two times a day (BID) | ORAL | Status: DC
  Administered 2022-08-19 – 2022-08-20 (×3): 1 via ORAL
  Filled 2022-08-19 (×3): qty 1

## 2022-08-19 MED ORDER — AMLODIPINE BESYLATE 5 MG PO TABS: 5.0000 mg | ORAL_TABLET | Freq: Every day | ORAL | Status: DC

## 2022-08-19 MED ORDER — AMOXICILLIN-POT CLAVULANATE 875-125 MG PO TABS: 1.0000 | ORAL_TABLET | Freq: Two times a day (BID) | ORAL | Status: DC

## 2022-08-19 MED ORDER — MORPHINE SULFATE (PF) 2 MG/ML IV SOLN
2.0000 mg | INTRAVENOUS | Status: DC | PRN
  Administered 2022-08-19 – 2022-08-20 (×7): 2 mg via INTRAVENOUS
  Filled 2022-08-19 (×7): qty 1

## 2022-08-19 MED ORDER — HYDROCODONE BIT-HOMATROP MBR 5-1.5 MG/5ML PO SOLN
5.0000 mL | Freq: Four times a day (QID) | ORAL | Status: DC | PRN
  Administered 2022-08-19 – 2022-08-20 (×3): 5 mL via ORAL
  Filled 2022-08-19 (×3): qty 5

## 2022-08-19 MED ORDER — AMLODIPINE BESYLATE 5 MG PO TABS
5.0000 mg | ORAL_TABLET | Freq: Every day | ORAL | Status: DC
  Administered 2022-08-19 – 2022-08-20 (×2): 5 mg via ORAL
  Filled 2022-08-19 (×2): qty 1

## 2022-08-19 MED ORDER — TRESIBA FLEXTOUCH 200 UNIT/ML ~~LOC~~ SOPN: 100.0000 [IU] | PEN_INJECTOR | Freq: Every day | SUBCUTANEOUS | 3 refills | Status: DC

## 2022-08-19 NOTE — Progress Notes (Signed)
Pateint stated she is only now having pain when she coughs. Dr. Sidney Ace stated patient to stay over night for observation with Cardio consult in the morning. Patient stated she is overall no longer having pain. Discharge pending in the AM.

## 2022-08-19 NOTE — Progress Notes (Signed)
PROGRESS NOTE        PATIENT DETAILS Name: [name] Age: 51 y.o. Sex: female Date of Birth: 23-Dec-1970 Admit Date: 08/18/2022 Admitting Physician Evalee Mutton Kristeen Mans, MD CBJ:SEGBTD, Charlane Ferretti, MD  Brief Summary: Patient is a 51 y.o.  female with history of tracheotomy-presenting with a 2-week history of hemoptysis.  Significant events: 11/27>> admit to [facility]-hemoptysis through tracheotomy  Significant studies: 11/25>> CT angio chest/abdomen/pelvis: No dissection/no PE-lung parenchyma normal. 11/27>> CXR: No PNA.  Significant microbiology data: 11/27>> COVID/influenza PCR: Negative  Procedures: 11/27>> flexible fiberoptic tracheoscopy  Consults: ENT  Subjective: Continues to have numerous episodes of coughing up clots via tracheostomy.  Quite anxious that she continues to have hemoptysis/clots through tracheotomy.  Objective: Vitals: Blood pressure (!) 154/99, pulse 96, temperature 98.4 F (36.9 C), temperature source Oral, resp. rate 16, height '5\' 1"'$  (1.549 m), weight 112.5 kg, SpO2 99 %.   Exam: Gen Exam:Alert awake-not in any distress HEENT:atraumatic, normocephalic Chest: B/L clear to auscultation anteriorly CVS:S1S2 regular Abdomen:soft non tender, non distended Extremities:no edema Neurology: Non focal Skin: no rash  Pertinent Labs/Radiology:    Latest Ref Rng & Units 08/19/2022    6:45 AM 08/18/2022    9:54 AM 08/16/2022   12:35 AM  CBC  WBC 4.0 - 10.5 K/uL 7.5  8.0  10.5   Hemoglobin 12.0 - 15.0 g/dL 11.8  10.8  12.5   Hematocrit 36.0 - 46.0 % 37.2  34.4  38.4   Platelets 150 - 400 K/uL 380  353  405     Lab Results  Component Value Date   NA 139 08/19/2022   K 4.0 08/19/2022   CL 106 08/19/2022   CO2 25 08/19/2022      Assessment/Plan: Hemoptysis through tracheotomy Continues to have hemoptysis through tracheotomy-concerned about going home Complicated history of prior subglottic  stenosis/tracheotomy since 2018.  Per patient-she has had similar issues in the past but hemoptysis has never lasted this long. Appreciate ENT input-suspected subglottic source for bleeding.  Tracheal mucosa/major bronchi appeared normal.  Recent CT with normal lung parenchyma. Spoke with ENT at [facility]. [place]-(where patient normally follows)-awaiting inpatient bed-has been accepted as a transfer. Will empirically start on Augmentin in case she has low-grade tracheitis.  AKI Mild-hemodynamically mediated-likely due to recent nausea/vomiting. Creatinine improved with supportive care. Follow periodically.  Bronchial asthma Stable-not in exacerbation-continue bronchodilators  Chronic HFpEF Euvolemic Lasix/lisinopril held due to AKI-will resume over the next few days.  HTN Lasix/lisinopril held due to AKI Will start low-dose amlodipine instead-with plans to resume Lasix/lisinopril when renal function more stable.  DM-2 CBGs stable-continue Semglee 100 units daily along with SSI.  Recent Labs    08/18/22 1739 08/18/22 2119 08/19/22 0803  GLUCAP 84 179* 200*    HLD Continue statin  Peripheral neuropathy Chronic pain syndrome Continue Lyrica and narcotics  Normocytic anemia Mild Doubt of any clinical significance Follow CBC.  Likely.  Anxiety disorder Depression Slightly anxious due to acute illness/hospitalization.  But otherwise stable Continue bupropion/Zoloft and as needed hydroxyzine  OSA Has tracheotomy  Morbid Obesity: Estimated body mass index is 46.86 kg/m as calculated from the following:   Height as of this encounter: '5\' 1"'$  (1.549 m).   Weight as of this encounter: 112.5 kg.   Code status:   Code Status: Full Code   DVT Prophylaxis: SCDs Start: 08/18/22 1254  Family Communication: None at bedside   Disposition Plan: Status is: Inpatient Remains inpatient appropriate because: Hemoptysis via tracheostomy-awaiting transfer to [facility].   Planned Discharge Destination: [facility]   Diet: Diet Order             Diet Carb Modified Fluid consistency: Thin; Room service appropriate? Yes  Diet effective now                     Antimicrobial agents: Anti-infectives (From admission, onward)    Start     Dose/Rate Route Frequency Ordered Stop   08/19/22 1000  amoxicillin-clavulanate (AUGMENTIN) 875-125 MG per tablet 1 tablet        1 tablet Oral Every 12 hours 08/19/22 0913          MEDICATIONS: Scheduled Meds:  amitriptyline  10 mg Oral QHS   amoxicillin-clavulanate  1 tablet Oral Q12H   buPROPion  150 mg Oral BID   insulin aspart  0-15 Units Subcutaneous TID WC   insulin aspart  0-5 Units Subcutaneous QHS   insulin glargine-yfgn  100 Units Subcutaneous Q1200   mometasone-formoterol  2 puff Inhalation BID   montelukast  10 mg Oral QHS   pantoprazole  40 mg Oral BID   pregabalin  50 mg Oral TID   rosuvastatin  20 mg Oral Daily   sertraline  100 mg Oral QHS   sodium chloride flush  3 mL Intravenous Q12H   Continuous Infusions: PRN Meds:.acetaminophen **OR** acetaminophen, albuterol, HYDROcodone bit-homatropine, hydrOXYzine, oxyCODONE-acetaminophen **AND** oxyCODONE, polyethylene glycol   I have personally reviewed following labs and imaging studies  LABORATORY DATA: CBC: Recent Labs  Lab 08/16/22 0035 08/18/22 0954 08/19/22 0645  WBC 10.5 8.0 7.5  NEUTROABS  --  2.9  --   HGB 12.5 10.8* 11.8*  HCT 38.4 34.4* 37.2  MCV 86.9 90.8 89.6  PLT 405* 353 235    Basic Metabolic Panel: Recent Labs  Lab 08/16/22 0035 08/18/22 0954 08/19/22 0645  NA 137 138 139  K 3.8 4.0 4.0  CL 99 107 106  CO2 21* 23 25  GLUCOSE 209* 225* 165*  BUN 12 28* 16  CREATININE 0.96 1.88* 1.17*  CALCIUM 9.8 8.5* 8.8*    GFR: Estimated Creatinine Clearance: 65.4 mL/min (A) (by C-G formula based on SCr of 1.17 mg/dL (H)).  Liver Function Tests: Recent Labs  Lab  08/16/22 0200 08/18/22 0954 08/19/22 0645  AST '29 26 25  '$ ALT '31 26 27  '$ ALKPHOS 104 105 111  BILITOT 0.4 0.5 0.3  PROT 8.0 7.0 7.6  ALBUMIN 3.8 3.6 3.5   Recent Labs  Lab 08/16/22 0035  LIPASE 35   No results for input(s): "AMMONIA" in the last 168 hours.  Coagulation Profile: No results for input(s): "INR", "PROTIME" in the last 168 hours.  Cardiac Enzymes: No results for input(s): "CKTOTAL", "CKMB", "CKMBINDEX", "TROPONINI" in the last 168 hours.  BNP (last 3 results) No results for input(s): "PROBNP" in the last 8760 hours.  Lipid Profile: No results for input(s): "CHOL", "HDL", "LDLCALC", "TRIG", "CHOLHDL", "LDLDIRECT" in the last 72 hours.  Thyroid Function Tests: No results for input(s): "TSH", "T4TOTAL", "FREET4", "T3FREE", "THYROIDAB" in the last 72 hours.  Anemia Panel: No results for input(s): "VITAMINB12", "FOLATE", "FERRITIN", "TIBC", "IRON", "RETICCTPCT" in the last 72 hours.  Urine analysis:    Component Value Date/Time   COLORURINE YELLOW 10/14/2021 [place] 10/14/2021 1154   LABSPEC 1.017 10/14/2021 1154  PHURINE 6.0 10/14/2021 1154   GLUCOSEU NEGATIVE 10/14/2021 1154   HGBUR NEGATIVE 10/14/2021 1154   BILIRUBINUR NEGATIVE 10/14/2021 1154   BILIRUBINUR neg 04/12/2018 1006   KETONESUR 5 (A) 10/14/2021 1154   PROTEINUR 100 (A) 10/14/2021 1154   UROBILINOGEN 0.2 04/12/2018 1006   UROBILINOGEN 0.2 11/18/2014 2325   NITRITE NEGATIVE 10/14/2021 1154   LEUKOCYTESUR NEGATIVE 10/14/2021 1154    Sepsis Labs: Lactic Acid, Venous    Component Value Date/Time   LATICACIDVEN 1.1 10/15/2021 2131    MICROBIOLOGY: Recent Results (from the past 240 hour(s))  Resp Panel by RT-PCR (Flu A&B, Covid) Anterior Nasal Swab     Status: None   Collection Time: 08/18/22 10:42 AM   Specimen: Anterior Nasal Swab  Result Value Ref Range Status   SARS Coronavirus 2 by RT PCR NEGATIVE NEGATIVE Final    Comment: (NOTE) SARS-CoV-2 target nucleic  acids are NOT DETECTED.  The SARS-CoV-2 RNA is generally detectable in upper respiratory specimens during the acute phase of infection. The lowest concentration of SARS-CoV-2 viral copies this assay can detect is 138 copies/mL. A negative result does not preclude SARS-Cov-2 infection and should not be used as the sole basis for treatment or other patient management decisions. A negative result may occur with  improper specimen collection/handling, submission of specimen other than nasopharyngeal swab, presence of viral mutation(s) within the areas targeted by this assay, and inadequate number of viral copies(<138 copies/mL). A negative result must be combined with clinical observations, patient history, and epidemiological information. The expected result is Negative.  Fact Sheet for Patients:  EntrepreneurPulse.com.au  Fact Sheet for Healthcare Providers:  IncredibleEmployment.be  This test is no t yet approved or cleared by the [place] FDA and  has been authorized for detection and/or diagnosis of SARS-CoV-2 by FDA under an Emergency Use Authorization (EUA). This EUA will remain  in effect (meaning this test can be used) for the duration of the COVID-19 declaration under Section 564(b)(1) of the Act, 21 U.S.C.section 360bbb-3(b)(1), unless the authorization is terminated  or revoked sooner.       Influenza A by PCR NEGATIVE NEGATIVE Final   Influenza B by PCR NEGATIVE NEGATIVE Final    Comment: (NOTE) The Xpert Xpress SARS-CoV-2/FLU/RSV plus assay is intended as an aid in the diagnosis of influenza from Nasopharyngeal swab specimens and should not be used as a sole basis for treatment. Nasal washings and aspirates are unacceptable for Xpert Xpress SARS-CoV-2/FLU/RSV testing.  Fact Sheet for Patients: EntrepreneurPulse.com.au  Fact Sheet for Healthcare Providers: IncredibleEmployment.be  This  test is not yet approved or cleared by the [place] FDA and has been authorized for detection and/or diagnosis of SARS-CoV-2 by FDA under an Emergency Use Authorization (EUA). This EUA will remain in effect (meaning this test can be used) for the duration of the COVID-19 declaration under Section 564(b)(1) of the Act, 21 U.S.C. section 360bbb-3(b)(1), unless the authorization is terminated or revoked.  Performed at [facility], [place] [address] [place], [place] [address]     RADIOLOGY STUDIES/RESULTS: DG Chest 2 View  Result Date: 08/18/2022 CLINICAL DATA:  Shortness of breath EXAM: CHEST - 2 VIEW COMPARISON:  Radiograph 10/29/2021 FINDINGS: Tracheostomy tube overlies trachea. Unchanged mildly enlarged cardiac silhouette. No focal airspace consolidation. No pleural effusion or pneumothorax. No acute osseous abnormality. Thoracic spondylosis. Bilateral shoulder degenerative changes. IMPRESSION: No evidence of acute cardiopulmonary disease. Electronically Signed   By: Maurine Simmering M.D.   On: 08/18/2022 10:16  LOS: 0 days   Oren Binet, MD  [facility]    To contact the attending provider between 7A-7P or the covering provider during after hours 7P-7A, please log into the web site [email or web address] and access using universal [place] password for that web site. If you do not have the password, please call the hospital operator.  08/19/2022, 11:07 AM

## 2022-08-19 NOTE — Progress Notes (Signed)
NEW ADMISSION NOTE New Admission Note:   Arrival Method: ED stretcher Mental Orientation: aaox4 Telemetry: 8m7 Assessment: Completed Skin: See assessment IV: RAC Pain: 7/10 throat/back from coughing Tubes: tracheostomy Safety Measures: Safety Fall Prevention Plan has been given, discussed and signed Admission: Completed [facility] Orientation: Patient has been orientated to the room, unit and staff.  Family: none at bedside  Orders have been reviewed and implemented. Will continue to monitor the patient. Call light has been placed within reach and bed alarm has been activated.   AVira Agar RN

## 2022-08-19 NOTE — ED Notes (Signed)
Respiratory at [facility]

## 2022-08-19 NOTE — Telephone Encounter (Signed)
Summary: Medication clarification needed   Loma Sousa with [employer] called in needing clarification on the Freestyle 2 libre reader script that was written. She states it says 1 each and needs someone to clarify that. Please assist patient further      Questioning  " One each or 1?"  Advised would route to practice for prescribers review.  Answer Assessment - Initial Assessment Questions 1. NAME of MEDICINE: "What medicine(s) are you calling about?"     Summary: Medication clarification needed   Loma Sousa with [employer] called in needing clarification on the Freestyle 2 libre reader script that was written. She states it says 1 each and needs someone to clarify that. Please assist patient further  Protocols used: Medication Question Call-A-AH

## 2022-08-19 NOTE — Telephone Encounter (Signed)
Requested Prescriptions  Pending Prescriptions Disp Refills   benzonatate (TESSALON) 100 MG capsule [Pharmacy Med Name: Benzonatate 100 MG Oral Capsule] 20 capsule 0    Sig: TAKE 1 CAPSULE BY MOUTH TWICE DAILY AS NEEDED FOR COUGH     Ear, Nose, and Throat:  Antitussives/Expectorants Passed - 08/17/2022 10:55 AM      Passed - Valid encounter within last 12 months    Recent Outpatient Visits           1 month ago Type 1 diabetes mellitus with other specified complication ([place])   Ovando, [place], MD   4 months ago Type 1 diabetes mellitus with other specified complication ([place])   [place], Charlane Ferretti, MD   7 months ago Encounter for [employer] annual wellness exam   [place], [place], MD   9 months ago Hypertension associated with diabetes [facility])   [place], [place], MD   10 months ago Hyperlipidemia due to type 1 diabetes mellitus [facility])   [place], [place], MD       Future Appointments             In 1 month [name], MD [place]             Continuous Blood Gluc Receiver (FREESTYLE LIBRE 2 READER) [place] [Pharmacy Med Name: FREESTYLE LIBRE 2 READER MIS] 1 each 2    Sig: USE TO CHECK BLOOD SUGAR THREE TIMES DAILY.     Endocrinology: Diabetes - Testing Supplies Passed - 08/17/2022 10:55 AM      Passed - Valid encounter within last 12 months    Recent Outpatient Visits           1 month ago Type 1 diabetes mellitus with other specified complication ([place])   [place], [place], MD   4 months ago Type 1 diabetes mellitus with other specified complication [facility])   [place] [name], MD   7 months ago Encounter for [employer] annual wellness exam   [place], Charlane Ferretti, MD   9 months ago Hypertension associated with diabetes [facility])   [place], Charlane Ferretti, MD   10 months ago Hyperlipidemia due to type 1 diabetes mellitus [facility])   [place], MD       Future Appointments             In 1 month [name], MD [place]

## 2022-08-19 NOTE — Progress Notes (Signed)
This RN called to get transportation services for the transfer of patient to [place] ([phone]) assisted me and transferred me to the transfer center to arrange transportation.  Admitting MD is doctor [place].  Patient will be going to room C909. Attempted to call report to the floor ([phone]) and was told to call back in about 5 minutes.   Charge number for admitting floor is ([phone]).  Transport will be sent from Admitting hospital. Will attempt to call back for report.  Aurther Loft, RN

## 2022-08-19 NOTE — Discharge Summary (Addendum)
PATIENT DETAILS Name: [name] Age: 51 y.o. Sex: female Date of Birth: [birth year] MRN: [id number]. Admitting Physician: Jonetta Osgood, MD [id number], Charlane Ferretti, MD  Admit Date: 08/18/2022 Discharge date: 08/20/2022  Recommendations for Outpatient Follow-up:  Follow up with PCP in 1-2 weeks Please obtain CMP/CBC in one week Being transferred to [facility] for management of subglottic stenosis related bleeding.  Admitted From:  Home  Disposition: [facility]   Discharge Condition: fair  CODE STATUS:   Code Status: Full Code   Diet recommendation:  Diet Order             Diet - low sodium heart healthy           Diet Carb Modified           Diet Carb Modified Fluid consistency: Thin; Room service appropriate? Yes  Diet effective now                    Brief Summary: Patient is a 50 y.o.  female with history of tracheotomy-presenting with a 2-week history of hemoptysis.   Significant events: 11/27>> admit to [facility]-hemoptysis through tracheotomy   Significant studies: 11/25>> CT angio chest/abdomen/pelvis: No dissection/no PE-lung parenchyma normal. 11/27>> CXR: No PNA.   Significant microbiology data: 11/27>> COVID/influenza PCR: Negative   Procedures: 11/27>> flexible fiberoptic tracheoscopy   Consults: ENT  Brief Hospital Course:   Brief Summary Patient is a 51 y.o.  female with history of tracheotomy-presenting with a 2-week history of hemoptysis.   Significant events: 11/27>> admit to [facility]-hemoptysis through tracheotomy   Significant studies: 11/25>> CT angio chest/abdomen/pelvis: No dissection/no PE-lung parenchyma normal. 11/27>> CXR: No PNA.   Significant microbiology data: 11/27>> COVID/influenza PCR: Negative   Procedures: 11/27>> flexible fiberoptic tracheoscopy   Consults: ENT    Assessment/Plan: Hemoptysis through tracheotomy Thought to be due to subglottis  stenosis related bleeding Continues to have hemoptysis to tracheotomy especially when she coughs Continue Augmentin in case she has low-grade tracheitis Continue antitussives Unfortunately transfer to [facility] canceled yesterday when she developed atypical chest pain. No further chest pain-she is stable to transfer to [facility] when a bed is available.  I have reached out to the transfer center this morning.   Atypical chest pain Per patient-likely related to GERD/reflux. Continue PPI twice daily Add as needed Maalox Troponins negative D-dimer minimally elevated but recent CTA chest negative for PE.   AKI Mild-hemodynamically mediated-likely due to recent nausea/vomiting. Creatinine improved with supportive care. Follow periodically.   Bronchial asthma Stable-not in exacerbation-continue bronchodilators   Chronic HFpEF Euvolemic Lasix/lisinopril held due to AKI-will resume over the next few days.   HTN Lasix/lisinopril held due to AKI BP stable on amlodipine.   DM-2 CBGs stable-continue Semglee 100 units daily along with SSI.    HLD Continue statin   Peripheral neuropathy Chronic pain syndrome Continue Lyrica and narcotics   Normocytic anemia Mild Doubt of any clinical significance Follow CBC.    Anxiety disorder Depression Stable-less anxious today-continue as needed hydroxyzine  Remains on Wellbutrin/Zoloft.    OSA Has tracheotomy   Morbid Obesity: Estimated body mass index is 49.9 kg/m as calculated from the following:   Height as of this encounter: _0  (1.549 m).   Weight as of this encounter: 119.8 kg.   Discharge Diagnoses:  Principal Problem:   AKI (acute kidney injury) ([place]) Active Problems:   OSA (obstructive sleep apnea)   Essential hypertension   Migraine  GERD (gastroesophageal reflux disease)   Depression   Morbid obesity with BMI of 40.0-44.9, adult (HCC)   Chronic pain syndrome   Hemoptysis   Generalized  anxiety disorder   Tracheal stenosis   Normocytic anemia   Subglottic stenosis   Tracheostomy dependent (HCC)   Asthma   Insulin dependent type 2 diabetes mellitus (HCC)   Chronic diastolic heart failure (HCC)   Chronic respiratory failure with hypoxia [facility])   Discharge Instructions:  Activity:  As tolerated with Full fall precautions use walker/cane & assistance as needed   Discharge Instructions     Diet - low sodium heart healthy   Complete by: As directed    Diet Carb Modified   Complete by: As directed    Increase activity slowly   Complete by: As directed       Allergies as of 08/20/2022       Reactions   Reglan [metoclopramide] Other (See Comments)   Panic attack        Medication List     STOP taking these medications    aspirin EC 81 MG tablet   buprenorphine-naloxone 2-0.5 mg Subl SL tablet Commonly known as: SUBOXONE   ciprofloxacin-dexamethasone OTIC suspension Commonly known as: CIPRODEX   lisinopril 20 MG tablet Commonly known as: ZESTRIL   naproxen 500 MG tablet Commonly known as: NAPROSYN   Semaglutide (2 MG/DOSE) 8 MG/3ML Sopn       TAKE these medications    albuterol 108 (90 Base) MCG/ACT inhaler Commonly known as: ProAir HFA Inhale 2 puffs into the lungs every 6 (six) hours as needed for wheezing or shortness of breath.   albuterol (2.5 MG/3ML) 0.083% nebulizer solution Commonly known as: PROVENTIL Take 3 mLs (2.5 mg total) by nebulization every 6 (six) hours as needed for wheezing or shortness of breath.   amitriptyline 10 MG tablet Commonly known as: ELAVIL Take 1 tablet (10 mg total) by mouth at bedtime.   amLODipine 5 MG tablet Commonly known as: NORVASC Take 1 tablet (5 mg total) by mouth daily.   amoxicillin-clavulanate 875-125 MG tablet Commonly known as: AUGMENTIN Take 1 tablet by mouth every 12 (twelve) hours.   benzonatate 100 MG capsule Commonly known as: TESSALON TAKE 1 CAPSULE BY MOUTH TWICE DAILY AS  NEEDED FOR COUGH What changed: See the new instructions.   buPROPion 150 MG 12 hr tablet Commonly known as: Wellbutrin SR Take 1 tablet (150 mg total) by mouth 2 (two) times daily.   cetirizine 10 MG tablet Commonly known as: ZYRTEC TAKE 1 TABLET BY MOUTH AT BEDTIME   dapagliflozin propanediol 10 MG Tabs tablet Commonly known as: Farxiga Take 1 tablet (10 mg total) by mouth daily before breakfast.   diclofenac Sodium 1 % Gel Commonly known as: Voltaren Apply 4 g topically 4 (four) times daily.   Dulera 200-5 MCG/ACT Aero Generic drug: mometasone-formoterol Inhale 2 puffs into the lungs in the morning and at bedtime.   FreeStyle Libre 2 Reader Kerrin Mo USE TO CHECK BLOOD SUGAR THREE TIMES DAILY. What changed: additional instructions   FreeStyle Libre 2 Sensor Misc Use to check blood sugar three times daily. Change sensor once every 14 days. E11.9   furosemide 20 MG tablet Commonly known as: LASIX Take 1 tablet (20 mg total) by mouth daily.   gabapentin 600 MG tablet Commonly known as: NEURONTIN Take 600 mg by mouth 4 (four) times daily.   hydrOXYzine 25 MG tablet Commonly known as: ATARAX Take 1 tablet (25 mg total) by  mouth 3 (three) times daily as needed. for anxiety   Insulin Pen Needle 31G X 8 MM Misc Inject 1 Units as directed daily.   melatonin 3 MG Tabs tablet Take 1 tablet (3 mg total) by mouth at bedtime.   Misc. Devices Misc Nebulizer device. Diagnosis - Asthma   Misc. Devices Misc Shower chair   montelukast 10 MG tablet Commonly known as: SINGULAIR Take 1 tablet (10 mg total) by mouth at bedtime.   ondansetron 4 MG tablet Commonly known as: ZOFRAN Take 1 tablet (4 mg total) by mouth every 8 (eight) hours as needed for nausea or vomiting.   OneTouch Delica Lancets 16X Misc 1 each by Does not apply route daily. Use to monitor glucose levels daily; E11.9   OneTouch Verio test strip Generic drug: glucose blood Use to check glucose levels TID.    OneTouch Verio w/Device Kit 1 each by Does not apply route daily. Use to monitor glucose levels daily; E11.9   oxyCODONE-acetaminophen 10-325 MG tablet Commonly known as: PERCOCET Take 1 tablet by mouth 5 (five) times daily as needed.   pantoprazole 40 MG tablet Commonly known as: PROTONIX Take 1 tablet (40 mg total) by mouth 2 (two) times daily.   pregabalin 50 MG capsule Commonly known as: LYRICA Take 1 capsule (50 mg total) by mouth in the morning, at noon, and at bedtime.   rosuvastatin 20 MG tablet Commonly known as: Crestor Take 1 tablet (20 mg total) by mouth daily.   sertraline 100 MG tablet Commonly known as: ZOLOFT Take 1 tablet (100 mg total) by mouth at bedtime.   tiZANidine 4 MG tablet Commonly known as: ZANAFLEX Take 1 tablet (4 mg total) by mouth every 8 (eight) hours as needed for muscle spasms.   Tyler Aas FlexTouch 200 UNIT/ML FlexTouch Pen Generic drug: insulin degludec Inject 100 Units into the skin daily. What changed: how much to take   Vitamin D (Ergocalciferol) 1.25 MG (50000 UNIT) Caps capsule Commonly known as: DRISDOL Take 1 capsule (50,000 Units total) by mouth every 7 (seven) days.         Follow-up Information     [name], MD. Schedule an appointment as soon as possible for a visit in 1 week(s).   Specialty: Family Medicine Contact information: [address] [place] [address] [place] [address] [phone]                Allergies  Allergen Reactions   Reglan [Metoclopramide] Other (See Comments)    Panic attack     Other Procedures/Studies: DG Chest 2 View  Result Date: 08/18/2022 CLINICAL DATA:  Shortness of breath EXAM: CHEST - 2 VIEW COMPARISON:  Radiograph 10/29/2021 FINDINGS: Tracheostomy tube overlies trachea. Unchanged mildly enlarged cardiac silhouette. No focal airspace consolidation. No pleural effusion or pneumothorax. No acute osseous abnormality. Thoracic spondylosis. Bilateral shoulder  degenerative changes. IMPRESSION: No evidence of acute cardiopulmonary disease. Electronically Signed   By: Maurine Simmering M.D.   On: 08/18/2022 10:16   CT Angio Chest/Abd/Pel for Dissection W and/or Wo Contrast  Result Date: 08/16/2022 CLINICAL DATA:  Chest pain and vomiting for 2 days EXAM: CT ANGIOGRAPHY CHEST, ABDOMEN AND PELVIS TECHNIQUE: Non-contrast CT of the chest was initially obtained. Multidetector CT imaging through the chest, abdomen and pelvis was performed using the standard protocol during bolus administration of intravenous contrast. Multiplanar reconstructed images and MIPs were obtained and reviewed to evaluate the vascular anatomy. RADIATION DOSE REDUCTION: This exam was performed according to the departmental dose-optimization  program which includes automated exposure control, adjustment of the mA and/or kV according to patient size and/or use of iterative reconstruction technique. CONTRAST:  178m OMNIPAQUE IOHEXOL 350 MG/ML SOLN COMPARISON:  10/12/2021 FINDINGS: CTA CHEST FINDINGS Cardiovascular: Initial precontrast images demonstrate no hyperdense crescent to suggest acute aortic injury. Post-contrast images demonstrate the thoracic aorta to be within normal limits. No aneurysmal dilatation or dissection is seen. The heart is not significantly enlarged. The pulmonary artery shows a normal branching pattern without intraluminal filling defect to suggest pulmonary embolism. Mediastinum/Nodes: Tracheostomy tube is noted in satisfactory position. No hilar or mediastinal adenopathy is noted. The esophagus is air-filled and within normal limits. Lungs/Pleura: The lungs are well aerated bilaterally. No focal infiltrate or sizable effusion is seen. Musculoskeletal: No rib fracture is seen. No compression deformity in the thoracic spine is noted. Review of the MIP images confirms the above findings. CTA ABDOMEN AND PELVIS FINDINGS VASCULAR Aorta: Normal caliber aorta without aneurysm, dissection,  vasculitis or significant stenosis. Celiac: Patent without evidence of aneurysm, dissection, vasculitis or significant stenosis. SMA: Patent without evidence of aneurysm, dissection, vasculitis or significant stenosis. Renals: Both renal arteries are patent without evidence of aneurysm, dissection, vasculitis, fibromuscular dysplasia or significant stenosis. IMA: Patent without evidence of aneurysm, dissection, vasculitis or significant stenosis. Inflow: Iliacs show no aneurysmal dilatation or dissection. Veins: No specific venous abnormality is noted. Review of the MIP images confirms the above findings. NON-VASCULAR Hepatobiliary: Fatty infiltration of the liver is noted. The gallbladder has been surgically removed. Pancreas: Unremarkable. No pancreatic ductal dilatation or surrounding inflammatory changes. Spleen: Normal in size without focal abnormality. Adrenals/Urinary Tract: Adrenal glands are within normal limits. Kidneys demonstrate a normal enhancement pattern bilaterally. No renal calculi or obstructive changes are seen. The bladder is partially distended. Stomach/Bowel: No obstructive or inflammatory changes of the colon are noted. Scattered fecal material is seen throughout the colon. The appendix is not visualized consistent with a prior surgical history. Small bowel and stomach are within normal limits. Lymphatic: No sizable adenopathy is seen. Reproductive: Status post hysterectomy. No adnexal masses. Other: No abdominal wall hernia or abnormality. No abdominopelvic ascites. Musculoskeletal: Postsurgical changes are seen at L4-5. No acute bony abnormality is noted. Review of the MIP images confirms the above findings. IMPRESSION: No evidence of aortic dissection or aneurysmal dilatation. No evidence of pulmonary emboli. No acute abnormality to correspond with the given clinical history. Chronic changes as described above. Electronically Signed   By: MInez CatalinaM.D.   On: 08/16/2022 03:18      TODAY-DAY OF DISCHARGE:  Subjective:   Harmoney Bradley today has no headache,no chest abdominal pain,no new weakness tingling or numbness, feels much better wants to go home today.   Objective:   Blood pressure 122/67, pulse 95, temperature 98.2 F (36.8 C), temperature source Oral, resp. rate 18, height _0  (1.549 m), weight 119.8 kg, SpO2 95 %.  Intake/Output Summary (Last 24 hours) at 08/20/2022 1203 Last data filed at 08/20/2022 1100 Gross per 24 hour  Intake 460 ml  Output 0 ml  Net 460 ml   Filed Weights   08/18/22 0927 08/19/22 1345 08/20/22 0500  Weight: 112.5 kg 115.3 kg 119.8 kg    Exam: Awake Alert, Oriented *3, No new F.N deficits, Normal affect [place].AT,PERRAL Supple Neck,No JVD, No cervical lymphadenopathy appriciated.  Symmetrical Chest wall movement, Good air movement bilaterally, CTAB RRR,No Gallops,Rubs or new Murmurs, No Parasternal Heave +ve B.Sounds, Abd Soft, Non tender, No organomegaly appriciated, No rebound -guarding or  rigidity. No Cyanosis, Clubbing or edema, No new Rash or bruise   PERTINENT RADIOLOGIC STUDIES: No results found.   PERTINENT LAB RESULTS: CBC: Recent Labs    08/18/22 0954 08/19/22 0645  WBC 8.0 7.5  HGB 10.8* 11.8*  HCT 34.4* 37.2  PLT 353 380   CMET CMP     Component Value Date/Time   NA 139 08/19/2022 0645   NA 141 07/09/2022 1106   K 4.0 08/19/2022 0645   CL 106 08/19/2022 0645   CO2 25 08/19/2022 0645   GLUCOSE 165 (H) 08/19/2022 0645   BUN 16 08/19/2022 0645   BUN 14 07/09/2022 1106   CREATININE 1.17 (H) 08/19/2022 0645   CREATININE 0.93 08/01/2015 1202   CALCIUM 8.8 (L) 08/19/2022 0645   PROT 7.6 08/19/2022 0645   PROT 7.6 07/09/2022 1106   ALBUMIN 3.5 08/19/2022 0645   ALBUMIN 4.4 07/09/2022 1106   AST 25 08/19/2022 0645   ALT 27 08/19/2022 0645   ALKPHOS 111 08/19/2022 0645   BILITOT 0.3 08/19/2022 0645   BILITOT 0.2 07/09/2022 1106   GFRNONAA 56 (L) 08/19/2022 0645   GFRAA 68  04/10/2020 1634    GFR Estimated Creatinine Clearance: 68 mL/min (A) (by C-G formula based on SCr of 1.17 mg/dL (H)). No results for input(s): "LIPASE", "AMYLASE" in the last 72 hours. No results for input(s): "CKTOTAL", "CKMB", "CKMBINDEX", "TROPONINI" in the last 72 hours. Invalid input(s): "POCBNP" Recent Labs    08/19/22 2241  DDIMER 0.65*   No results for input(s): "HGBA1C" in the last 72 hours. No results for input(s): "CHOL", "HDL", "LDLCALC", "TRIG", "CHOLHDL", "LDLDIRECT" in the last 72 hours. No results for input(s): "TSH", "T4TOTAL", "T3FREE", "THYROIDAB" in the last 72 hours.  Invalid input(s): "FREET3" No results for input(s): "VITAMINB12", "FOLATE", "FERRITIN", "TIBC", "IRON", "RETICCTPCT" in the last 72 hours. Coags: No results for input(s): "INR" in the last 72 hours.  Invalid input(s): "PT" Microbiology: Recent Results (from the past 240 hour(s))  Resp Panel by RT-PCR (Flu A&B, Covid) Anterior Nasal Swab     Status: None   Collection Time: 08/18/22 10:42 AM   Specimen: Anterior Nasal Swab  Result Value Ref Range Status   SARS Coronavirus 2 by RT PCR NEGATIVE NEGATIVE Final    Comment: (NOTE) SARS-CoV-2 target nucleic acids are NOT DETECTED.  The SARS-CoV-2 RNA is generally detectable in upper respiratory specimens during the acute phase of infection. The lowest concentration of SARS-CoV-2 viral copies this assay can detect is 138 copies/mL. A negative result does not preclude SARS-Cov-2 infection and should not be used as the sole basis for treatment or other patient management decisions. A negative result may occur with  improper specimen collection/handling, submission of specimen other than nasopharyngeal swab, presence of viral mutation(s) within the areas targeted by this assay, and inadequate number of viral copies(<138 copies/mL). A negative result must be combined with clinical observations, patient history, and epidemiological information. The  expected result is Negative.  Fact Sheet for Patients:  EntrepreneurPulse.com.au  Fact Sheet for Healthcare Providers:  IncredibleEmployment.be  This test is no t yet approved or cleared by the [place] FDA and  has been authorized for detection and/or diagnosis of SARS-CoV-2 by FDA under an Emergency Use Authorization (EUA). This EUA will remain  in effect (meaning this test can be used) for the duration of the COVID-19 declaration under Section 564(b)(1) of the Act, 21 U.S.C.section 360bbb-3(b)(1), unless the authorization is terminated  or revoked sooner.       Influenza  A by PCR NEGATIVE NEGATIVE Final   Influenza B by PCR NEGATIVE NEGATIVE Final    Comment: (NOTE) The Xpert Xpress SARS-CoV-2/FLU/RSV plus assay is intended as an aid in the diagnosis of influenza from Nasopharyngeal swab specimens and should not be used as a sole basis for treatment. Nasal washings and aspirates are unacceptable for Xpert Xpress SARS-CoV-2/FLU/RSV testing.  Fact Sheet for Patients: EntrepreneurPulse.com.au  Fact Sheet for Healthcare Providers: IncredibleEmployment.be  This test is not yet approved or cleared by the [place] FDA and has been authorized for detection and/or diagnosis of SARS-CoV-2 by FDA under an Emergency Use Authorization (EUA). This EUA will remain in effect (meaning this test can be used) for the duration of the COVID-19 declaration under Section 564(b)(1) of the Act, 21 U.S.C. section 360bbb-3(b)(1), unless the authorization is terminated or revoked.  Performed at [facility], [place] [address], [place], [place] [address]   Respiratory (~20 pathogens) panel by PCR     Status: Abnormal   Collection Time: 08/18/22  3:40 PM   Specimen: Nasopharyngeal Swab; Respiratory  Result Value Ref Range Status   Adenovirus NOT DETECTED NOT DETECTED Final   Coronavirus 229E NOT DETECTED NOT  DETECTED Final    Comment: (NOTE) The Coronavirus on the Respiratory Panel, DOES NOT test for the novel  Coronavirus (2019 nCoV)    Coronavirus HKU1 NOT DETECTED NOT DETECTED Final   Coronavirus NL63 NOT DETECTED NOT DETECTED Final   Coronavirus OC43 NOT DETECTED NOT DETECTED Final   Metapneumovirus NOT DETECTED NOT DETECTED Final   Rhinovirus / Enterovirus NOT DETECTED NOT DETECTED Final   Influenza A NOT DETECTED NOT DETECTED Final   Influenza B NOT DETECTED NOT DETECTED Final   Parainfluenza Virus 1 NOT DETECTED NOT DETECTED Final   Parainfluenza Virus 2 NOT DETECTED NOT DETECTED Final   Parainfluenza Virus 3 NOT DETECTED NOT DETECTED Final   Parainfluenza Virus 4 NOT DETECTED NOT DETECTED Final   Respiratory Syncytial Virus DETECTED (A) NOT DETECTED Final   Bordetella pertussis NOT DETECTED NOT DETECTED Final   Bordetella Parapertussis NOT DETECTED NOT DETECTED Final   Chlamydophila pneumoniae NOT DETECTED NOT DETECTED Final   Mycoplasma pneumoniae NOT DETECTED NOT DETECTED Final    Comment: Performed at [facility], [place]. [address], [place], [place] [address]    FURTHER DISCHARGE INSTRUCTIONS:  Get Medicines reviewed and adjusted: Please take all your medications with you for your next visit with your Primary MD  Laboratory/radiological data: Please request your Primary MD to go over all hospital tests and procedure/radiological results at the follow up, please ask your Primary MD to get all Hospital records sent to his/her office.  In some cases, they will be blood work, cultures and biopsy results pending at the time of your discharge. Please request that your primary care M.D. goes through all the records of your hospital data and follows up on these results.  Also Note the following: If you experience worsening of your admission symptoms, develop shortness of breath, life threatening emergency, suicidal or homicidal thoughts you must seek medical attention  immediately by calling 911 or calling your MD immediately  if symptoms less severe.  You must read complete instructions/literature along with all the possible adverse reactions/side effects for all the Medicines you take and that have been prescribed to you. Take any new Medicines after you have completely understood and accpet all the possible adverse reactions/side effects.   Do not drive when taking Pain medications or sleeping  medications (Benzodaizepines)  Do not take more than prescribed Pain, Sleep and Anxiety Medications. It is not advisable to combine anxiety,sleep and pain medications without talking with your primary care practitioner  Special Instructions: If you have smoked or chewed Tobacco  in the last 2 yrs please stop smoking, stop any regular Alcohol  and or any Recreational drug use.  Wear Seat belts while driving.  Please note: You were cared for by a hospitalist during your hospital stay. Once you are discharged, your primary care physician will handle any further medical issues. Please note that NO REFILLS for any discharge medications will be authorized once you are discharged, as it is imperative that you return to your primary care physician (or establish a relationship with a primary care physician if you do not have one) for your post hospital discharge needs so that they can reassess your need for medications and monitor your lab values.  Total Time spent coordinating discharge including counseling, education and face to face time equals greater than 30 minutes.  SignedOren Binet 08/20/2022 12:03 PM

## 2022-08-19 NOTE — Progress Notes (Signed)
Patient having chest pain. Contacted Dr. Sidney Ace. Morphine 2g and Nitro order placed. Pain 9/10. Nitro and Morphine given. Patient states pain now 8/10 after a few minutes.

## 2022-08-20 ENCOUNTER — Ambulatory Visit: Payer: Medicare Other | Admitting: Orthopedic Surgery

## 2022-08-20 DIAGNOSIS — I110 Hypertensive heart disease with heart failure: Secondary | ICD-10-CM | POA: Diagnosis not present

## 2022-08-20 DIAGNOSIS — J9501 Hemorrhage from tracheostomy stoma: Secondary | ICD-10-CM | POA: Diagnosis not present

## 2022-08-20 DIAGNOSIS — G894 Chronic pain syndrome: Secondary | ICD-10-CM | POA: Diagnosis not present

## 2022-08-20 DIAGNOSIS — Z87891 Personal history of nicotine dependence: Secondary | ICD-10-CM | POA: Diagnosis not present

## 2022-08-20 DIAGNOSIS — E785 Hyperlipidemia, unspecified: Secondary | ICD-10-CM | POA: Diagnosis not present

## 2022-08-20 DIAGNOSIS — J0410 Acute tracheitis without obstruction: Secondary | ICD-10-CM | POA: Diagnosis not present

## 2022-08-20 DIAGNOSIS — F32A Depression, unspecified: Secondary | ICD-10-CM | POA: Diagnosis not present

## 2022-08-20 DIAGNOSIS — R1010 Upper abdominal pain, unspecified: Secondary | ICD-10-CM | POA: Diagnosis not present

## 2022-08-20 DIAGNOSIS — Z7985 Long-term (current) use of injectable non-insulin antidiabetic drugs: Secondary | ICD-10-CM | POA: Diagnosis not present

## 2022-08-20 DIAGNOSIS — I5032 Chronic diastolic (congestive) heart failure: Secondary | ICD-10-CM | POA: Diagnosis not present

## 2022-08-20 DIAGNOSIS — Z7984 Long term (current) use of oral hypoglycemic drugs: Secondary | ICD-10-CM | POA: Diagnosis not present

## 2022-08-20 DIAGNOSIS — J386 Stenosis of larynx: Secondary | ICD-10-CM | POA: Diagnosis not present

## 2022-08-20 DIAGNOSIS — N179 Acute kidney failure, unspecified: Secondary | ICD-10-CM | POA: Diagnosis not present

## 2022-08-20 DIAGNOSIS — E119 Type 2 diabetes mellitus without complications: Secondary | ICD-10-CM | POA: Diagnosis not present

## 2022-08-20 DIAGNOSIS — R0789 Other chest pain: Secondary | ICD-10-CM | POA: Diagnosis not present

## 2022-08-20 DIAGNOSIS — Z7951 Long term (current) use of inhaled steroids: Secondary | ICD-10-CM | POA: Diagnosis not present

## 2022-08-20 DIAGNOSIS — Z794 Long term (current) use of insulin: Secondary | ICD-10-CM | POA: Diagnosis not present

## 2022-08-20 DIAGNOSIS — I10 Essential (primary) hypertension: Secondary | ICD-10-CM | POA: Diagnosis not present

## 2022-08-20 DIAGNOSIS — J4489 Other specified chronic obstructive pulmonary disease: Secondary | ICD-10-CM | POA: Diagnosis not present

## 2022-08-20 DIAGNOSIS — E1165 Type 2 diabetes mellitus with hyperglycemia: Secondary | ICD-10-CM | POA: Diagnosis not present

## 2022-08-20 DIAGNOSIS — R079 Chest pain, unspecified: Secondary | ICD-10-CM | POA: Diagnosis not present

## 2022-08-20 DIAGNOSIS — M545 Low back pain, unspecified: Secondary | ICD-10-CM | POA: Diagnosis not present

## 2022-08-20 DIAGNOSIS — Z79899 Other long term (current) drug therapy: Secondary | ICD-10-CM | POA: Diagnosis not present

## 2022-08-20 DIAGNOSIS — Z79891 Long term (current) use of opiate analgesic: Secondary | ICD-10-CM | POA: Diagnosis not present

## 2022-08-20 DIAGNOSIS — R042 Hemoptysis: Secondary | ICD-10-CM | POA: Diagnosis not present

## 2022-08-20 DIAGNOSIS — J9601 Acute respiratory failure with hypoxia: Secondary | ICD-10-CM | POA: Diagnosis not present

## 2022-08-20 DIAGNOSIS — Z833 Family history of diabetes mellitus: Secondary | ICD-10-CM | POA: Diagnosis not present

## 2022-08-20 DIAGNOSIS — E1142 Type 2 diabetes mellitus with diabetic polyneuropathy: Secondary | ICD-10-CM | POA: Diagnosis not present

## 2022-08-20 DIAGNOSIS — Z8249 Family history of ischemic heart disease and other diseases of the circulatory system: Secondary | ICD-10-CM | POA: Diagnosis not present

## 2022-08-20 LAB — GLUCOSE, CAPILLARY
Glucose-Capillary: 181 mg/dL — ABNORMAL HIGH (ref 70–99)
Glucose-Capillary: 158 mg/dL — ABNORMAL HIGH (ref 70–99)
Glucose-Capillary: 189 mg/dL — ABNORMAL HIGH (ref 70–99)

## 2022-08-20 MED ORDER — NONFORMULARY OR COMPOUNDED ITEM
4.0000 [drp] | Freq: Two times a day (BID) | Status: DC
  Administered 2022-08-20: 4 [drp] via RESPIRATORY_TRACT
  Filled 2022-08-20 (×2): qty 1

## 2022-08-20 MED ORDER — ALUM & MAG HYDROXIDE-SIMETH 200-200-20 MG/5ML PO SUSP: 30.0000 mL | Freq: Four times a day (QID) | ORAL | Status: DC | PRN

## 2022-08-20 MED ORDER — NONFORMULARY OR COMPOUNDED ITEM
4.0000 [drp] | Freq: Two times a day (BID) | Status: DC
  Filled 2022-08-20: qty 1

## 2022-08-20 MED ORDER — TIZANIDINE HCL 2 MG PO TABS
4.0000 mg | ORAL_TABLET | Freq: Three times a day (TID) | ORAL | Status: DC | PRN
  Administered 2022-08-20: 4 mg via ORAL
  Filled 2022-08-20 (×2): qty 2

## 2022-08-20 NOTE — Progress Notes (Signed)
Notified provider  patient did not take the sliding scale insulin due to she said when her CBG gets below 150 starts feeling poorly.  Currently CBG 158

## 2022-08-20 NOTE — Plan of Care (Signed)
  Problem: Nutritional: Goal: Maintenance of adequate nutrition will improve Outcome: Progressing   Problem: Tissue Perfusion: Goal: Adequacy of tissue perfusion will improve Outcome: Progressing   

## 2022-08-20 NOTE — Progress Notes (Addendum)
PROGRESS NOTE        PATIENT DETAILS Name: [name] Age: 51 y.o. Sex: female Date of Birth: 06-26-1971 Admit Date: 08/18/2022 Admitting Physician Evalee Mutton Kristeen Mans, MD ION:[id number], Charlane Ferretti, MD  Brief Summary: Patient is a 51 y.o.  female with history of tracheotomy-presenting with a 2-week history of hemoptysis.  Significant events: 11/27>> admit to [facility]-hemoptysis through tracheotomy  Significant studies: 11/25>> CT angio chest/abdomen/pelvis: No dissection/no PE-lung parenchyma normal. 11/27>> CXR: No PNA.  Significant microbiology data: 11/27>> COVID/influenza PCR: Negative  Procedures: 11/27>> flexible fiberoptic tracheoscopy  Consults: ENT  Subjective: Had brief chest pain last night-thinks it was reflux.  Still coughing-still having hemoptysis through her tracheotomy site.  Still requesting transfer to [facility].  Objective: Vitals: Blood pressure 99/70, pulse 90, temperature 98.3 F (36.8 C), temperature source Oral, resp. rate 18, height '5\' 1"'$  (1.549 m), weight 119.8 kg, SpO2 (!) 84 %.   Exam: Gen Exam:Alert awake-not in any distress HEENT:atraumatic, normocephalic Chest: B/L clear to auscultation anteriorly CVS:S1S2 regular Abdomen:soft non tender, non distended Extremities:no edema Neurology: Non focal Skin: no rash  Pertinent Labs/Radiology:    Latest Ref Rng & Units 08/19/2022    6:45 AM 08/18/2022    9:54 AM 08/16/2022   12:35 AM  CBC  WBC 4.0 - 10.5 K/uL 7.5  8.0  10.5   Hemoglobin 12.0 - 15.0 g/dL 11.8  10.8  12.5   Hematocrit 36.0 - 46.0 % 37.2  34.4  38.4   Platelets 150 - 400 K/uL 380  353  405     Lab Results  Component Value Date   NA 139 08/19/2022   K 4.0 08/19/2022   CL 106 08/19/2022   CO2 25 08/19/2022      Assessment/Plan: Hemoptysis through tracheotomy Thought to be due to subglottis stenosis related bleeding Continues to have hemoptysis to tracheotomy  especially when she coughs Continue Augmentin in case she has low-grade tracheitis Continue antitussives Unfortunately transfer to [facility] canceled yesterday when she developed atypical chest pain. No further chest pain-she is stable to transfer to [facility] when a bed is available.  I have reached out to the transfer center this morning.  Addendum: Spoke with Dr Payton Spark at [place] back on transfer list-no beds available.  She recommends continuing Augmentin-and suggest trying Ciprodex nebulizers (I have discussed with pharmacy)  Atypical chest pain Per patient-likely related to GERD/reflux. Continue PPI twice daily Add as needed Maalox Troponins negative D-dimer minimally elevated but recent CTA chest negative for PE.  AKI Mild-hemodynamically mediated-likely due to recent nausea/vomiting. Creatinine improved with supportive care. Follow periodically.  Bronchial asthma Stable-not in exacerbation-continue bronchodilators  Chronic HFpEF Euvolemic Lasix/lisinopril held due to AKI-will resume over the next few days.  HTN Lasix/lisinopril held due to AKI BP stable on amlodipine.  DM-2 CBGs stable-continue Semglee 100 units daily along with SSI.  Recent Labs    08/19/22 1623 08/19/22 2139 08/20/22 0756  GLUCAP 118* 160* 181*     HLD Continue statin  Peripheral neuropathy Chronic pain syndrome Continue Lyrica and narcotics  Normocytic anemia Mild Doubt of any clinical significance Follow CBC.   Anxiety disorder Depression Stable-less anxious today-continue as needed hydroxyzine  Remains on Wellbutrin/Zoloft.   OSA Has tracheotomy  Morbid Obesity: Estimated body mass index is 49.9 kg/m as calculated from the following:  Height as of this encounter: '5\' 1"'$  (1.549 m).   Weight as of this encounter: 119.8 kg.   Code status:   Code Status: Full Code   DVT Prophylaxis: SCDs Start: 08/18/22 1254    Family Communication: Spouse was on the line with the patient when I was rounding.   Disposition Plan: Status is: Inpatient Remains inpatient appropriate because: Hemoptysis via tracheostomy-awaiting transfer to [facility].   Planned Discharge Destination: [facility]   Diet: Diet Order             Diet - low sodium heart healthy           Diet Carb Modified           Diet Carb Modified Fluid consistency: Thin; Room service appropriate? Yes  Diet effective now                     Antimicrobial agents: Anti-infectives (From admission, onward)    Start     Dose/Rate Route Frequency Ordered Stop   08/19/22 1000  amoxicillin-clavulanate (AUGMENTIN) 875-125 MG per tablet 1 tablet        1 tablet Oral Every 12 hours 08/19/22 0913     08/19/22 0000  amoxicillin-clavulanate (AUGMENTIN) 875-125 MG tablet        1 tablet Oral Every 12 hours 08/19/22 1327          MEDICATIONS: Scheduled Meds:  amitriptyline  10 mg Oral QHS   amLODipine  5 mg Oral Daily   amoxicillin-clavulanate  1 tablet Oral Q12H   buPROPion  150 mg Oral BID   insulin aspart  0-15 Units Subcutaneous TID WC   insulin aspart  0-5 Units Subcutaneous QHS   insulin glargine-yfgn  100 Units Subcutaneous Q1200   mometasone-formoterol  2 puff Inhalation BID   montelukast  10 mg Oral QHS   pantoprazole  40 mg Oral BID   pregabalin  50 mg Oral TID   rosuvastatin  20 mg Oral Daily   sertraline  100 mg Oral QHS   sodium chloride flush  3 mL Intravenous Q12H   Continuous Infusions: PRN Meds:.acetaminophen **OR** acetaminophen, albuterol, alum & mag hydroxide-simeth, HYDROcodone bit-homatropine, hydrOXYzine, morphine injection, nitroGLYCERIN, oxyCODONE-acetaminophen **AND** oxyCODONE, polyethylene glycol, tiZANidine   I have personally reviewed following labs and imaging studies  LABORATORY DATA: CBC: Recent Labs  Lab 08/16/22 0035 08/18/22 0954 08/19/22 0645  WBC  10.5 8.0 7.5  NEUTROABS  --  2.9  --   HGB 12.5 10.8* 11.8*  HCT 38.4 34.4* 37.2  MCV 86.9 90.8 89.6  PLT 405* 353 380     Basic Metabolic Panel: Recent Labs  Lab 08/16/22 0035 08/18/22 0954 08/19/22 0645  NA 137 138 139  K 3.8 4.0 4.0  CL 99 107 106  CO2 21* 23 25  GLUCOSE 209* 225* 165*  BUN 12 28* 16  CREATININE 0.96 1.88* 1.17*  CALCIUM 9.8 8.5* 8.8*     GFR: Estimated Creatinine Clearance: 68 mL/min (A) (by C-G formula based on SCr of 1.17 mg/dL (H)).  Liver Function Tests: Recent Labs  Lab 08/16/22 0200 08/18/22 0954 08/19/22 0645  AST '29 26 25  '$ ALT '31 26 27  '$ ALKPHOS 104 105 111  BILITOT 0.4 0.5 0.3  PROT 8.0 7.0 7.6  ALBUMIN 3.8 3.6 3.5    Recent Labs  Lab 08/16/22 0035  LIPASE 35    No results for input(s): "AMMONIA" in the last 168 hours.  Coagulation Profile:  No results for input(s): "INR", "PROTIME" in the last 168 hours.  Cardiac Enzymes: No results for input(s): "CKTOTAL", "CKMB", "CKMBINDEX", "TROPONINI" in the last 168 hours.  BNP (last 3 results) No results for input(s): "PROBNP" in the last 8760 hours.  Lipid Profile: No results for input(s): "CHOL", "HDL", "LDLCALC", "TRIG", "CHOLHDL", "LDLDIRECT" in the last 72 hours.  Thyroid Function Tests: No results for input(s): "TSH", "T4TOTAL", "FREET4", "T3FREE", "THYROIDAB" in the last 72 hours.  Anemia Panel: No results for input(s): "VITAMINB12", "FOLATE", "FERRITIN", "TIBC", "IRON", "RETICCTPCT" in the last 72 hours.  Urine analysis:    Component Value Date/Time   COLORURINE YELLOW 10/14/2021 1154   APPEARANCEUR CLEAR 10/14/2021 1154   LABSPEC 1.017 10/14/2021 1154   PHURINE 6.0 10/14/2021 1154   GLUCOSEU NEGATIVE 10/14/2021 1154   HGBUR NEGATIVE 10/14/2021 1154   BILIRUBINUR NEGATIVE 10/14/2021 1154   BILIRUBINUR neg 04/12/2018 1006   KETONESUR 5 (A) 10/14/2021 1154   PROTEINUR 100 (A) 10/14/2021 1154   UROBILINOGEN 0.2 04/12/2018 1006   UROBILINOGEN 0.2 11/18/2014 2325    NITRITE NEGATIVE 10/14/2021 1154   LEUKOCYTESUR NEGATIVE 10/14/2021 1154    Sepsis Labs: Lactic Acid, Venous    Component Value Date/Time   LATICACIDVEN 1.1 10/15/2021 2131    MICROBIOLOGY: Recent Results (from the past 240 hour(s))  Resp Panel by RT-PCR (Flu A&B, Covid) Anterior Nasal Swab     Status: None   Collection Time: 08/18/22 10:42 AM   Specimen: Anterior Nasal Swab  Result Value Ref Range Status   SARS Coronavirus 2 by RT PCR NEGATIVE NEGATIVE Final    Comment: (NOTE) SARS-CoV-2 target nucleic acids are NOT DETECTED.  The SARS-CoV-2 RNA is generally detectable in upper respiratory specimens during the acute phase of infection. The lowest concentration of SARS-CoV-2 viral copies this assay can detect is 138 copies/mL. A negative result does not preclude SARS-Cov-2 infection and should not be used as the sole basis for treatment or other patient management decisions. A negative result may occur with  improper specimen collection/handling, submission of specimen other than nasopharyngeal swab, presence of viral mutation(s) within the areas targeted by this assay, and inadequate number of viral copies(<138 copies/mL). A negative result must be combined with clinical observations, patient history, and epidemiological information. The expected result is Negative.  Fact Sheet for Patients:  EntrepreneurPulse.com.au  Fact Sheet for Healthcare Providers:  IncredibleEmployment.be  This test is no t yet approved or cleared by the [place] FDA and  has been authorized for detection and/or diagnosis of SARS-CoV-2 by FDA under an Emergency Use Authorization (EUA). This EUA will remain  in effect (meaning this test can be used) for the duration of the COVID-19 declaration under Section 564(b)(1) of the Act, 21 U.S.C.section 360bbb-3(b)(1), unless the authorization is terminated  or revoked sooner.       Influenza A by PCR  NEGATIVE NEGATIVE Final   Influenza B by PCR NEGATIVE NEGATIVE Final    Comment: (NOTE) The Xpert Xpress SARS-CoV-2/FLU/RSV plus assay is intended as an aid in the diagnosis of influenza from Nasopharyngeal swab specimens and should not be used as a sole basis for treatment. Nasal washings and aspirates are unacceptable for Xpert Xpress SARS-CoV-2/FLU/RSV testing.  Fact Sheet for Patients: EntrepreneurPulse.com.au  Fact Sheet for Healthcare Providers: IncredibleEmployment.be  This test is not yet approved or cleared by the [place] FDA and has been authorized for detection and/or diagnosis of SARS-CoV-2 by FDA under an Emergency Use Authorization (EUA). This EUA will remain in effect (meaning  this test can be used) for the duration of the COVID-19 declaration under Section 564(b)(1) of the Act, 21 U.S.C. section 360bbb-3(b)(1), unless the authorization is terminated or revoked.  Performed at [facility], [place] [address], [place], [place] [address]   Respiratory (~20 pathogens) panel by PCR     Status: Abnormal   Collection Time: 08/18/22  3:40 PM   Specimen: Nasopharyngeal Swab; Respiratory  Result Value Ref Range Status   Adenovirus NOT DETECTED NOT DETECTED Final   Coronavirus 229E NOT DETECTED NOT DETECTED Final    Comment: (NOTE) The Coronavirus on the Respiratory Panel, DOES NOT test for the novel  Coronavirus (2019 nCoV)    Coronavirus HKU1 NOT DETECTED NOT DETECTED Final   Coronavirus NL63 NOT DETECTED NOT DETECTED Final   Coronavirus OC43 NOT DETECTED NOT DETECTED Final   Metapneumovirus NOT DETECTED NOT DETECTED Final   Rhinovirus / Enterovirus NOT DETECTED NOT DETECTED Final   Influenza A NOT DETECTED NOT DETECTED Final   Influenza B NOT DETECTED NOT DETECTED Final   Parainfluenza Virus 1 NOT DETECTED NOT DETECTED Final   Parainfluenza Virus 2 NOT DETECTED NOT DETECTED Final   Parainfluenza Virus 3 NOT DETECTED NOT  DETECTED Final   Parainfluenza Virus 4 NOT DETECTED NOT DETECTED Final   Respiratory Syncytial Virus DETECTED (A) NOT DETECTED Final   Bordetella pertussis NOT DETECTED NOT DETECTED Final   Bordetella Parapertussis NOT DETECTED NOT DETECTED Final   Chlamydophila pneumoniae NOT DETECTED NOT DETECTED Final   Mycoplasma pneumoniae NOT DETECTED NOT DETECTED Final    Comment: Performed at [facility], [place]. [address] [place], [place] [address]    RADIOLOGY STUDIES/RESULTS: DG Chest 2 View  Result Date: 08/18/2022 CLINICAL DATA:  Shortness of breath EXAM: CHEST - 2 VIEW COMPARISON:  Radiograph 10/29/2021 FINDINGS: Tracheostomy tube overlies trachea. Unchanged mildly enlarged cardiac silhouette. No focal airspace consolidation. No pleural effusion or pneumothorax. No acute osseous abnormality. Thoracic spondylosis. Bilateral shoulder degenerative changes. IMPRESSION: No evidence of acute cardiopulmonary disease. Electronically Signed   By: Maurine Simmering M.D.   On: 08/18/2022 10:16     LOS: 1 day   Oren Binet, MD  [facility]    To contact the attending provider between 7A-7P or the covering provider during after hours 7P-7A, please log into the web site [email or web address] and access using universal [place] password for that web site. If you do not have the password, please call the hospital operator.  08/20/2022, 8:05 AM

## 2022-08-20 NOTE — Progress Notes (Signed)
Patient escorted out via stretcher with [place] from [facility] IV left in per request of Erma Pinto at [facility] when gave report.

## 2022-08-20 NOTE — Progress Notes (Signed)
Spoke to [place] at [facility] to receive patient to room 918

## 2022-08-21 ENCOUNTER — Ambulatory Visit: Payer: Medicare Other | Admitting: Physical Therapy

## 2022-08-21 DIAGNOSIS — R0789 Other chest pain: Secondary | ICD-10-CM | POA: Diagnosis not present

## 2022-08-21 DIAGNOSIS — R1010 Upper abdominal pain, unspecified: Secondary | ICD-10-CM | POA: Diagnosis not present

## 2022-08-21 MED FILL — Ciprofloxacin-Dexamethasone Otic Susp 0.3-0.1%: OTIC | Qty: 7.5 | Status: AC

## 2022-08-25 ENCOUNTER — Telehealth: Payer: Self-pay

## 2022-08-25 NOTE — Patient Outreach (Signed)
  Care Coordination [facility] Note Transition Care Management Unsuccessful Follow-up Telephone Call  Date of discharge and from where:  08/22/22- [place]  Attempts:  2nd Attempt  Reason for unsuccessful TCM follow-up call:  Unable to reach patient      Enzo Montgomery, RN,BSN,CCM [place] [facility] Telephonic Care Management Coordinator Direct Phone: [phone] Toll Free: [phone] Fax: [phone]

## 2022-08-25 NOTE — Patient Outreach (Signed)
  Care Coordination TOC Note Transition Care Management Unsuccessful Follow-up Telephone Call  Date of discharge and from where:  08/23/22-[facility]  Attempts:  1st Attempt  Reason for unsuccessful TCM follow-up call:  No answer/busy    Enzo Montgomery, RN,BSN,CCM [place] [facility] Telephonic Care Management Coordinator Direct Phone: [phone] Toll Free: [phone] Fax: [phone]

## 2022-08-26 ENCOUNTER — Telehealth: Payer: Self-pay

## 2022-08-26 NOTE — Patient Outreach (Signed)
  Care Coordination TOC Note Transition Care Management Unsuccessful Follow-up Telephone Call  Date of discharge and from where:  08/23/22-[facility]  Attempts:  3rd Attempt  Reason for unsuccessful TCM follow-up call:  Unable to reach patient    Hetty Blend [place] [facility] Telephonic Care Management Coordinator Direct Phone: [phone] Toll Free: [phone] Fax: [phone]

## 2022-08-27 ENCOUNTER — Other Ambulatory Visit: Payer: Self-pay | Admitting: Family Medicine

## 2022-08-27 DIAGNOSIS — E119 Type 2 diabetes mellitus without complications: Secondary | ICD-10-CM

## 2022-09-02 ENCOUNTER — Other Ambulatory Visit: Payer: Self-pay | Admitting: Family Medicine

## 2022-09-02 MED ORDER — HYDROCOD POLI-CHLORPHE POLI ER 10-8 MG/5ML PO SUER: 5.0000 mL | Freq: Two times a day (BID) | ORAL | 0 refills | Status: DC

## 2022-09-05 DIAGNOSIS — J9610 Chronic respiratory failure, unspecified whether with hypoxia or hypercapnia: Secondary | ICD-10-CM | POA: Diagnosis not present

## 2022-09-05 DIAGNOSIS — G4733 Obstructive sleep apnea (adult) (pediatric): Secondary | ICD-10-CM | POA: Diagnosis not present

## 2022-09-05 DIAGNOSIS — J449 Chronic obstructive pulmonary disease, unspecified: Secondary | ICD-10-CM | POA: Diagnosis not present

## 2022-09-05 DIAGNOSIS — J45909 Unspecified asthma, uncomplicated: Secondary | ICD-10-CM | POA: Diagnosis not present

## 2022-09-08 ENCOUNTER — Other Ambulatory Visit: Payer: Self-pay | Admitting: Family Medicine

## 2022-09-08 MED ORDER — BENZONATATE 200 MG PO CAPS: 200.0000 mg | ORAL_CAPSULE | Freq: Two times a day (BID) | ORAL | 0 refills | Status: DC | PRN

## 2022-09-09 DIAGNOSIS — R030 Elevated blood-pressure reading, without diagnosis of hypertension: Secondary | ICD-10-CM | POA: Diagnosis not present

## 2022-09-09 DIAGNOSIS — K0889 Other specified disorders of teeth and supporting structures: Secondary | ICD-10-CM | POA: Diagnosis not present

## 2022-09-09 DIAGNOSIS — M5416 Radiculopathy, lumbar region: Secondary | ICD-10-CM | POA: Diagnosis not present

## 2022-09-09 DIAGNOSIS — E119 Type 2 diabetes mellitus without complications: Secondary | ICD-10-CM | POA: Diagnosis not present

## 2022-09-09 DIAGNOSIS — Z79899 Other long term (current) drug therapy: Secondary | ICD-10-CM | POA: Diagnosis not present

## 2022-09-09 DIAGNOSIS — Z930 Tracheostomy status: Secondary | ICD-10-CM | POA: Diagnosis not present

## 2022-09-09 DIAGNOSIS — H9201 Otalgia, right ear: Secondary | ICD-10-CM | POA: Diagnosis not present

## 2022-09-09 DIAGNOSIS — G8929 Other chronic pain: Secondary | ICD-10-CM | POA: Diagnosis not present

## 2022-09-09 DIAGNOSIS — M545 Low back pain, unspecified: Secondary | ICD-10-CM | POA: Diagnosis not present

## 2022-09-10 ENCOUNTER — Encounter: Payer: Self-pay | Admitting: Orthopaedic Surgery

## 2022-09-10 ENCOUNTER — Ambulatory Visit (INDEPENDENT_AMBULATORY_CARE_PROVIDER_SITE_OTHER): Payer: Medicare Other | Admitting: Orthopaedic Surgery

## 2022-09-10 VITALS — BP 160/105 | HR 121 | Ht 61.0 in | Wt 247.0 lb

## 2022-09-10 DIAGNOSIS — M4326 Fusion of spine, lumbar region: Secondary | ICD-10-CM | POA: Diagnosis not present

## 2022-09-10 DIAGNOSIS — G894 Chronic pain syndrome: Secondary | ICD-10-CM

## 2022-09-10 NOTE — Progress Notes (Signed)
Office Visit Note   Patient: [name]           Date of Birth: 06/26/1971           MRN: [id number] Visit Date: 09/10/2022              Requested by: [name], MD [place],   [id number] PCP: [name], MD   Assessment & Plan: Visit Diagnoses:  1. Fusion of spine of lumbar region   2. Chronic pain syndrome     Plan: Offered her referral to [facility] she understands that usually takes couple months to get into the clinic and she declined referral.  I again offered her physical therapy referral she declined.  We discussed weight loss helping her back pain.  We reviewed her imaging studies.  Her fusion looks good and she does not have compression in the rest of the lumbar spine.  She will call us back if she would like to either work on weight loss or on physical therapy referral to try to help some of her back pain symptoms.  She remains on high-dose pain medication narcotics.  Follow-Up Instructions: No follow-ups on file.   Orders:  No orders of the defined types were placed in this encounter.  No orders of the defined types were placed in this encounter.     Procedures: No procedures performed   Clinical Data: No additional findings.   Subjective: Chief Complaint  Patient presents with   Lower Back - Pain    HPI 51 year old female with previous fusion by Dr. Louanne Skye at L4-5 now slightly more than a year out from surgery.  She has multiple medical problems including heart failure respiratory failure in the past with hypoxia, tracheostomy.  She has had imaging studies after her fusion including CT scan and MRI.  This shows trace minimal bulge without compression above and below her fusion.  Fusion looks good.  Dr. Donavan Burnet ordered therapy but she did not go.  She states she has some sort of emergency and continues to be on pain management currently on Percocet 10/325 120 tablets monthly.  She  has had epidurals in the past she states she has started having some left leg symptoms sometimes tingling in her toes her back hurts at the lumbosacral junction.  BMI is 46.  She has not been successful in weight loss.  She did not attend therapy she states since she had some type of emergency problem with her family.    Objective: Vital Signs: BP (!) 160/105   Pulse (!) 121   Ht '5\' 1"'$  (1.549 m)   Wt 247 lb (112 kg)   LMP  (LMP Unknown)   BMI 46.67 kg/m   Physical Exam Constitutional:      Appearance: She is well-developed.  HENT:     Head: Normocephalic.     Right Ear: External ear normal.     Left Ear: External ear normal. There is no impacted cerumen.     Mouth/Throat:     Comments: Patient has functioning trach. Eyes:     Pupils: Pupils are equal, round, and reactive to light.  Neck:     Thyroid: No thyromegaly.     Trachea: No tracheal deviation.  Cardiovascular:     Rate and Rhythm: Normal rate.  Pulmonary:     Effort: Pulmonary effort is normal.     Comments: Functioning tracheostomy. Abdominal:     Palpations: Abdomen  is soft.  Musculoskeletal:     Cervical back: No rigidity.  Skin:    General: Skin is warm and dry.  Neurological:     Mental Status: She is alert and oriented to person, place, and time.  Psychiatric:        Behavior: Behavior normal.     Ortho Exam patient ambulates she has morbid obesity.  Negative straight leg raising 90 degrees negative logroll hips.  Specialty Comments:  MRI LUMBAR SPINE WITHOUT AND WITH CONTRAST   TECHNIQUE: Multiplanar and multiecho pulse sequences of the lumbar spine were obtained without and with intravenous contrast.   CONTRAST:  31m MULTIHANCE GADOBENATE DIMEGLUMINE 529 MG/ML IV SOLN   COMPARISON:  Lumbar radiographs 01/02/2022, lumbar spine MRI 12/09/2020   FINDINGS: Segmentation: Standard; the lowest formed disc space is designated L5-S1.   Alignment: There is straightening of the normal lumbar  lordosis. There is no antero or retrolisthesis.   Vertebrae: There has been interval posterior instrumented fusion at L4-L5 with suspected right laminectomy. There is no evidence of complication. Marrow signal is otherwise normal. There is no suspicious marrow signal abnormality or marrow edema. There is no abnormal marrow enhancement.   Conus medullaris and cauda equina: Conus extends to the L1 level. Conus and cauda equina appear normal. There is no abnormal enhancement of the cauda equina nerve roots.   Paraspinal and other soft tissues: Postsurgical changes are noted in the soft tissues posterior to the surgical levels. The paraspinal soft tissues are otherwise unremarkable.   Disc levels:   The disc heights at the nonsurgical levels are preserved.   T12-L1: No significant spinal canal or neural foraminal stenosis   L1-L2: No significant spinal canal or neural foraminal stenosis   L2-L3: No significant spinal canal or neural foraminal stenosis   L3-L4: There is a minimal disc bulge without significant spinal canal or neural foraminal stenosis   L4-L5: Status post posterior instrumented fusion and suspected right laminectomy. The previously seen disc protrusion has resolved following surgery. There is residual mild narrowing of the left subarticular zone without evidence of frank nerve root impingement. There is no residual spinal canal or neural foraminal stenosis. There is no convincing evidence of nerve root impingement or significant neural foraminal stenosis.   L5-S1: No significant spinal canal or neural foraminal stenosis.   IMPRESSION: 1. Status post posterior instrumented fusion and suspected right laminectomy at L4-L5. The previously seen disc protrusion on the study from 12/19/2020 is no longer present, and previously seen spinal canal stenosis has significantly improved. There is residual mild narrowing of the left subarticular zone without evidence  of frank nerve root impingement. No other convincing evidence of nerve root impingement to explain the patient's right-sided symptoms. 2. No significant spinal canal or neural foraminal stenosis at the other levels.     Electronically Signed   By: PValetta MoleM.D.   On: 01/16/2022 13:29  Imaging: No results found.   PMFS History: Patient Active Problem List   Diagnosis Date Noted   Gastritis and gastroduodenitis    Fusion of spine of lumbar region 08/20/2021   Spondylolisthesis, lumbar region 08/20/2021    Class: Chronic   Chronic respiratory failure with hypoxia ([place] [address] 06/20/2021   Recurrent vomiting 11/28/2020   Insulin dependent type 2 diabetes mellitus ([place] 04/12/2019   Chronic right shoulder pain    Chronic bilateral low back pain without sciatica 10/05/2018   Dysphonia 10/05/2018   Rotator cuff arthropathy, right 06/15/2018   AKI (acute kidney injury) (  [place]) 03/06/2018   Carpal tunnel syndrome 01/18/2018   Normocytic anemia 05/06/2017   Tracheostomy dependent ([place]) 03/26/2017   Morbid obesity with BMI of 40.0-44.9, adult ([place]) 03/13/2017   Subglottic stenosis 03/06/2017   Asthma 03/06/2017   Chronic diastolic heart failure ([place]) 03/06/2017   Tracheal stenosis 03/04/2017   Generalized anxiety disorder    Hemoptysis    Chronic pain syndrome    GERD (gastroesophageal reflux disease) 02/22/2016   Depression 02/22/2016   Migraine 01/30/2015   Bulging lumbar disc 09/05/2013   Essential hypertension 09/05/2013   Allergic rhinitis, seasonal 08/11/2012   Chronic cough 08/11/2012   OSA (obstructive sleep apnea) 12/04/2011   Past Medical History:  Diagnosis Date   Abnormal UGI series    Acute pain of right shoulder due to trauma 10/05/2018   Acute respiratory failure with hypoxia and hypercapnia (HCC) 10/23/2018   Allergy    Anxiety    Arthritis    Asthma    Chest pain of uncertain etiology 30/03/6225   Chronic back pain    Chronic chest pain    resolved, no  problems since 2019 per patient 07/27/19   Coffee ground vomiting 10/14/2021   COPD (chronic obstructive pulmonary disease) ([place])    Depression    DM (diabetes mellitus) ([place])    INSULIN DEPENDENT - TYPE 1   Dyspnea    GERD (gastroesophageal reflux disease)    Headache(784.0)    History of hiatal hernia    Hyperlipidemia    no med, diet controlled   Hypertension    Hypokalemia    Klebsiella cystitis 03/14/2017   Last Assessment & Plan:   Formatting of this note might be different from the original.  - On Zosyn  - Urine culture: Klebsiella pneumoniae   Pneumonia    Respiratory disease 05/2017   uses inhalers, neb tx prn, no oxygen   Rotator cuff tear 07/28/2019   Sleep apnea    does not use CPAP due to trach   Status post tracheostomy ([place]) 03/14/2017   Last Assessment & Plan:  Formatting of this note might be different from the original. - [place] - On tach - ENT following along (please see above for current trach management plan)   Tracheostomy in place [facility]) 02/2017    Family History  Problem Relation Age of Onset   Heart attack Mother    Stroke Mother    Diabetes Mother    Hypertension Mother    Arthritis Mother    Stroke Father    Asthma Sister    Hypertension Sister    Asthma Sister    Diabetes Sister    Asthma Brother    Seizures Brother    Asthma Brother    Diabetes Brother    Asthma Daughter    Asthma Son    Asthma Grandson    Colon cancer Neg Hx    Rectal cancer Neg Hx    Stomach cancer Neg Hx    Esophageal cancer Neg Hx    Ovarian cancer Neg Hx    Pancreatic cancer Neg Hx     Past Surgical History:  Procedure Laterality Date   ABDOMINAL HYSTERECTOMY  2009   APPENDECTOMY     BIOPSY  10/15/2021   Procedure: BIOPSY;  Surgeon: Thornton Park, MD;  Location: [place];  Service: Gastroenterology;;   CESAREAN SECTION     x 3   CHOLECYSTECTOMY N/A 03/02/2014   Procedure: LAPAROSCOPIC CHOLECYSTECTOMY;  Surgeon:  Joyice Faster. Cornett, MD;  Location: [facility];  Service: General;  Laterality: N/A;   ESOPHAGOGASTRODUODENOSCOPY (EGD) WITH PROPOFOL N/A 10/15/2021   Procedure: ESOPHAGOGASTRODUODENOSCOPY (EGD) WITH PROPOFOL;  Surgeon: Thornton Park, MD;  Location: [place];  Service: Gastroenterology;  Laterality: N/A;   HERNIA REPAIR     PANENDOSCOPY N/A 03/04/2017   Procedure: PANENDOSCOPY WITH POSSIBLE FOREIGN BODY REMOVAL;  Surgeon: Jodi Marble, MD;  Location: [place];  Service: ENT;  Laterality: N/A;   ROTATOR CUFF REPAIR Left    ROTATOR CUFF REPAIR Right 07/27/2019   SHOULDER ARTHROSCOPY WITH SUBACROMIAL DECOMPRESSION, ROTATOR CUFF REPAIR AND BICEP TENDON REPAIR Right 07/28/2019   Procedure: RIGHT SHOULDER ARTHROSCOP, MINI OPEN ROTATOR CUFF TEAR REPAIR,  BICEPS TENODESIS, DISTAL CLAVICLE EXCISION;  Surgeon: Meredith Pel, MD;  Location: [facility];  Service: Orthopedics;  Laterality: Right;   TOOTH EXTRACTION N/A 02/22/2021   Procedure: DENTAL RESTORATION/EXTRACTIONS;  Surgeon: Diona Browner, DMD;  Location: [facility];  Service: Oral Surgery;  Laterality: N/A;   TRACHEOSTOMY  02/2017   TUBAL LIGATION     VESICOVAGINAL FISTULA CLOSURE W/ TAH  2009   Social History   Occupational History   Not on file  Tobacco Use   Smoking status: Former    Packs/day: 0.50    Years: 25.00    Total pack years: 12.50    Types: Cigarettes    Quit date: 01/21/2016    Years since quitting: 6.6   Smokeless tobacco: Never  Vaping Use   Vaping Use: Never used  Substance and Sexual Activity   Alcohol use: Yes    Comment: social wine   Drug use: Not Currently    Types: Marijuana    Comment: couple times a month   Sexual activity: Yes    Partners: Male    Birth control/protection: Surgical    Comment: Hysterectomy

## 2022-09-11 ENCOUNTER — Other Ambulatory Visit: Payer: Self-pay | Admitting: Family Medicine

## 2022-09-11 DIAGNOSIS — R112 Nausea with vomiting, unspecified: Secondary | ICD-10-CM

## 2022-10-01 ENCOUNTER — Other Ambulatory Visit: Payer: Self-pay | Admitting: Family Medicine

## 2022-10-01 DIAGNOSIS — J4540 Moderate persistent asthma, uncomplicated: Secondary | ICD-10-CM

## 2022-10-06 DIAGNOSIS — J9610 Chronic respiratory failure, unspecified whether with hypoxia or hypercapnia: Secondary | ICD-10-CM | POA: Diagnosis not present

## 2022-10-06 DIAGNOSIS — G4733 Obstructive sleep apnea (adult) (pediatric): Secondary | ICD-10-CM | POA: Diagnosis not present

## 2022-10-06 DIAGNOSIS — J449 Chronic obstructive pulmonary disease, unspecified: Secondary | ICD-10-CM | POA: Diagnosis not present

## 2022-10-06 DIAGNOSIS — J45909 Unspecified asthma, uncomplicated: Secondary | ICD-10-CM | POA: Diagnosis not present

## 2022-10-08 ENCOUNTER — Other Ambulatory Visit: Payer: Self-pay | Admitting: Family Medicine

## 2022-10-08 DIAGNOSIS — Z930 Tracheostomy status: Secondary | ICD-10-CM | POA: Diagnosis not present

## 2022-10-08 DIAGNOSIS — E119 Type 2 diabetes mellitus without complications: Secondary | ICD-10-CM | POA: Diagnosis not present

## 2022-10-08 DIAGNOSIS — Z79899 Other long term (current) drug therapy: Secondary | ICD-10-CM | POA: Diagnosis not present

## 2022-10-08 DIAGNOSIS — M545 Low back pain, unspecified: Secondary | ICD-10-CM | POA: Diagnosis not present

## 2022-10-08 DIAGNOSIS — R030 Elevated blood-pressure reading, without diagnosis of hypertension: Secondary | ICD-10-CM | POA: Diagnosis not present

## 2022-10-08 DIAGNOSIS — M5416 Radiculopathy, lumbar region: Secondary | ICD-10-CM | POA: Diagnosis not present

## 2022-10-08 DIAGNOSIS — G8929 Other chronic pain: Secondary | ICD-10-CM | POA: Diagnosis not present

## 2022-10-09 ENCOUNTER — Ambulatory Visit: Payer: 59 | Attending: Family Medicine | Admitting: Family Medicine

## 2022-10-09 ENCOUNTER — Telehealth: Payer: Self-pay | Admitting: Family Medicine

## 2022-10-09 ENCOUNTER — Encounter: Payer: Self-pay | Admitting: Family Medicine

## 2022-10-09 VITALS — BP 144/91 | HR 110 | Ht 61.0 in | Wt 244.6 lb

## 2022-10-09 DIAGNOSIS — J4489 Other specified chronic obstructive pulmonary disease: Secondary | ICD-10-CM | POA: Insufficient documentation

## 2022-10-09 DIAGNOSIS — Z6841 Body Mass Index (BMI) 40.0 and over, adult: Secondary | ICD-10-CM

## 2022-10-09 DIAGNOSIS — I152 Hypertension secondary to endocrine disorders: Secondary | ICD-10-CM

## 2022-10-09 DIAGNOSIS — E1069 Type 1 diabetes mellitus with other specified complication: Secondary | ICD-10-CM | POA: Diagnosis not present

## 2022-10-09 DIAGNOSIS — Z79899 Other long term (current) drug therapy: Secondary | ICD-10-CM | POA: Diagnosis not present

## 2022-10-09 DIAGNOSIS — J9501 Hemorrhage from tracheostomy stoma: Secondary | ICD-10-CM | POA: Diagnosis not present

## 2022-10-09 DIAGNOSIS — Z794 Long term (current) use of insulin: Secondary | ICD-10-CM | POA: Insufficient documentation

## 2022-10-09 DIAGNOSIS — M5136 Other intervertebral disc degeneration, lumbar region: Secondary | ICD-10-CM

## 2022-10-09 DIAGNOSIS — G4733 Obstructive sleep apnea (adult) (pediatric): Secondary | ICD-10-CM | POA: Insufficient documentation

## 2022-10-09 DIAGNOSIS — E10649 Type 1 diabetes mellitus with hypoglycemia without coma: Secondary | ICD-10-CM | POA: Insufficient documentation

## 2022-10-09 DIAGNOSIS — N179 Acute kidney failure, unspecified: Secondary | ICD-10-CM | POA: Insufficient documentation

## 2022-10-09 DIAGNOSIS — F330 Major depressive disorder, recurrent, mild: Secondary | ICD-10-CM | POA: Diagnosis not present

## 2022-10-09 DIAGNOSIS — E1159 Type 2 diabetes mellitus with other circulatory complications: Secondary | ICD-10-CM | POA: Diagnosis not present

## 2022-10-09 DIAGNOSIS — Z981 Arthrodesis status: Secondary | ICD-10-CM | POA: Insufficient documentation

## 2022-10-09 DIAGNOSIS — Z23 Encounter for immunization: Secondary | ICD-10-CM | POA: Diagnosis not present

## 2022-10-09 DIAGNOSIS — F339 Major depressive disorder, recurrent, unspecified: Secondary | ICD-10-CM | POA: Insufficient documentation

## 2022-10-09 DIAGNOSIS — E785 Hyperlipidemia, unspecified: Secondary | ICD-10-CM | POA: Insufficient documentation

## 2022-10-09 DIAGNOSIS — I10 Essential (primary) hypertension: Secondary | ICD-10-CM | POA: Insufficient documentation

## 2022-10-09 DIAGNOSIS — Z9049 Acquired absence of other specified parts of digestive tract: Secondary | ICD-10-CM | POA: Diagnosis not present

## 2022-10-09 DIAGNOSIS — Z930 Tracheostomy status: Secondary | ICD-10-CM | POA: Insufficient documentation

## 2022-10-09 LAB — POCT GLYCOSYLATED HEMOGLOBIN (HGB A1C): HbA1c, POC (controlled diabetic range): 8.1 % — AB (ref 0.0–7.0)

## 2022-10-09 LAB — GLUCOSE, POCT (MANUAL RESULT ENTRY): POC Glucose: 192 mg/dl — AB (ref 70–99)

## 2022-10-09 MED ORDER — NOVOLOG FLEXPEN RELION 100 UNIT/ML ~~LOC~~ SOPN: 0.0000 [IU] | PEN_INJECTOR | Freq: Three times a day (TID) | SUBCUTANEOUS | 11 refills | Status: DC

## 2022-10-09 MED ORDER — BUPROPION HCL ER (SR) 150 MG PO TB12: 150.0000 mg | ORAL_TABLET | Freq: Two times a day (BID) | ORAL | 1 refills | Status: DC

## 2022-10-09 MED ORDER — TRESIBA FLEXTOUCH 200 UNIT/ML ~~LOC~~ SOPN: 170.0000 [IU] | PEN_INJECTOR | Freq: Every day | SUBCUTANEOUS | 3 refills | Status: DC

## 2022-10-09 MED ORDER — AMLODIPINE BESYLATE 10 MG PO TABS: 10.0000 mg | ORAL_TABLET | Freq: Every day | ORAL | 1 refills | Status: DC

## 2022-10-09 NOTE — Patient Instructions (Signed)

## 2022-10-09 NOTE — Telephone Encounter (Signed)
Patient with depression currently on treatment, would love psychotherapy.  Depression is worsening due to the stress of her multiple medical conditions

## 2022-10-09 NOTE — Progress Notes (Signed)
Discuss recent Ed visit. Abdominal pain and nausea.

## 2022-10-09 NOTE — Progress Notes (Signed)
Subjective:  Patient ID: [name], female    DOB: 06/12/1971  Age: 52 y.o. MRN: [id number]  CC: Diabetes   [name] is a 52 y.o. year old female with a history of type 1 diabetes mellitus (A1c 9.5), hypertension, obstructive sleep apnea, asthma, depression, s/p tracheostomy secondary to intubation related tracheal injury, subglottic stenosis (status post balloon dilatation and Kenalog injection of stenosis tracheostomy tube exchange by ENT, status post right rotator cuff surgery in 07/2019. Uses 2L oxygen at night, s/p R L4L5 transforaminal lumbar interbody fusion with rods, screws and cage, local bone graft, allogen bone graft, vivigen in 09/2021   Interval History:  She was hospitalized at [facility] from 08/18/2022 through 08/20/2022 for tracheostomy hemorrhage hemorrhage and subsequently transferred to [facility].  Laryngoscopy revealed crusting, granulation tissue.  She was treated with Augmentin and humidification. During hospital course she developed AKI and lisinopril and Lasix were held.  She complains that when she coughs she has started to notice blood last week.  After discharge symptoms resolved but then they restarted again.  Her ENT called her and she will be see him next week. This causes her to be depressed as she cannot function as she would like for her family and is having to depend on her husband a lot.  She also has her daughter who is wheelchair-bound.  She is currently on Zoloft and Wellbutrin for depression. Her back also continues to hurt.  Complains of nausea, abdominal cramping, unable to keep anything down, decreased appetite. Symptoms started over the last couple of weeks.  She is unsure of a history of gastroparesis.  She is currently on Ozempic for management of diabetes mellitus.  She has a CGM and had a single hypoglycemic value of 56.  She does not have her data with her and her sensor came off this morning  in the shower.  She states her CGM has been beneficial with regarding to her is helping her make choices. Past Medical History:  Diagnosis Date   Abnormal UGI series    Acute pain of right shoulder due to trauma 10/05/2018   Acute respiratory failure with hypoxia and hypercapnia (HCC) 10/23/2018   Allergy    Anxiety    Arthritis    Asthma    Chest pain of uncertain etiology 29/47/6546   Chronic back pain    Chronic chest pain    resolved, no problems since 2019 per patient 07/27/19   Coffee ground vomiting 10/14/2021   COPD (chronic obstructive pulmonary disease) (HCC)    Depression    DM (diabetes mellitus) ([place])    INSULIN DEPENDENT - TYPE 1   Dyspnea    GERD (gastroesophageal reflux disease)    Headache(784.0)    History of hiatal hernia    Hyperlipidemia    no med, diet controlled   Hypertension    Hypokalemia    Klebsiella cystitis 03/14/2017   Last Assessment & Plan:   Formatting of this note might be different from the original.  - On Zosyn  - Urine culture: Klebsiella pneumoniae   Pneumonia    Respiratory disease 05/2017   uses inhalers, neb tx prn, no oxygen   Rotator cuff tear 07/28/2019   Sleep apnea    does not use CPAP due to trach   Status post tracheostomy ([place]) 03/14/2017   Last Assessment & Plan:  Formatting of this note might be different from the original. - [place] - On  tach - ENT following along (please see above for current trach management plan)   Tracheostomy in place [facility]) 02/2017    Past Surgical History:  Procedure Laterality Date   ABDOMINAL HYSTERECTOMY  2009   APPENDECTOMY     BIOPSY  10/15/2021   Procedure: BIOPSY;  Surgeon: Thornton Park, MD;  Location: [place];  Service: Gastroenterology;;   CESAREAN SECTION     x 3   CHOLECYSTECTOMY N/A 03/02/2014   Procedure: LAPAROSCOPIC CHOLECYSTECTOMY;  Surgeon: Joyice Faster. Cornett, MD;  Location: [place];  Service: General;  Laterality: N/A;    ESOPHAGOGASTRODUODENOSCOPY (EGD) WITH PROPOFOL N/A 10/15/2021   Procedure: ESOPHAGOGASTRODUODENOSCOPY (EGD) WITH PROPOFOL;  Surgeon: Thornton Park, MD;  Location: [place];  Service: Gastroenterology;  Laterality: N/A;   HERNIA REPAIR     PANENDOSCOPY N/A 03/04/2017   Procedure: PANENDOSCOPY WITH POSSIBLE FOREIGN BODY REMOVAL;  Surgeon: Jodi Marble, MD;  Location: [place];  Service: ENT;  Laterality: N/A;   ROTATOR CUFF REPAIR Left    ROTATOR CUFF REPAIR Right 07/27/2019   SHOULDER ARTHROSCOPY WITH SUBACROMIAL DECOMPRESSION, ROTATOR CUFF REPAIR AND BICEP TENDON REPAIR Right 07/28/2019   Procedure: RIGHT SHOULDER ARTHROSCOP, MINI OPEN ROTATOR CUFF TEAR REPAIR,  BICEPS TENODESIS, DISTAL CLAVICLE EXCISION;  Surgeon: Meredith Pel, MD;  Location: [place];  Service: Orthopedics;  Laterality: Right;   TOOTH EXTRACTION N/A 02/22/2021   Procedure: DENTAL RESTORATION/EXTRACTIONS;  Surgeon: Diona Browner, DMD;  [facility];  Service: Oral Surgery;  Laterality: N/A;   TRACHEOSTOMY  02/2017   TUBAL LIGATION     VESICOVAGINAL FISTULA CLOSURE W/ TAH  2009    Family History  Problem Relation Age of Onset   Heart attack Mother    Stroke Mother    Diabetes Mother    Hypertension Mother    Arthritis Mother    Stroke Father    Asthma Sister    Hypertension Sister    Asthma Sister    Diabetes Sister    Asthma Brother    Seizures Brother    Asthma Brother    Diabetes Brother    Asthma Daughter    Asthma Son    Asthma Grandson    Colon cancer Neg Hx    Rectal cancer Neg Hx    Stomach cancer Neg Hx    Esophageal cancer Neg Hx    Ovarian cancer Neg Hx    Pancreatic cancer Neg Hx     Social History   Socioeconomic History   Marital status: Married    Spouse name: Journalist, newspaper   Number of children: Not on file   Years of education: Not on file   Highest education level: Not on file  Occupational History   Not on file  Tobacco Use   Smoking status: Former    Packs/day: 0.50     Years: 25.00    Total pack years: 12.50    Types: Cigarettes    Quit date: 01/21/2016    Years since quitting: 6.7   Smokeless tobacco: Never  Vaping Use   Vaping Use: Never used  Substance and Sexual Activity   Alcohol use: Yes    Comment: social wine   Drug use: Not Currently    Types: Marijuana    Comment: couple times a month   Sexual activity: Yes    Partners: Male    Birth control/protection: Surgical    Comment: Hysterectomy  Other Topics Concern   Not on file  Social History Narrative   Lives with husband  Social Determinants of Health   Financial Resource Strain: Medium Risk (06/06/2021)   Overall Financial Resource Strain (CARDIA)    Difficulty of Paying Living Expenses: Somewhat hard  Food Insecurity: Food Insecurity Present (06/06/2021)   Hunger Vital Sign    Worried About Running Out of Food in the Last Year: Sometimes true    Ran Out of Food in the Last Year: Sometimes true  Transportation Needs: No Transportation Needs (06/06/2021)   PRAPARE - Hydrologist (Medical): No    Lack of Transportation (Non-Medical): No  Physical Activity: Inactive (06/06/2021)   Exercise Vital Sign    Days of Exercise per Week: 0 days    Minutes of Exercise per Session: 0 min  Stress: Stress Concern Present (06/06/2021)   [place]    Feeling of Stress : Very much  Social Connections: Moderately Isolated (06/06/2021)   Social Connection and Isolation Panel [NHANES]    Frequency of Communication with Friends and Family: More than three times a week    Frequency of Social Gatherings with Friends and Family: More than three times a week    Attends Religious Services: Never    Marine scientist or Organizations: No    Attends Archivist Meetings: Never    Marital Status: Married    Allergies  Allergen Reactions   Reglan [Metoclopramide] Other (See Comments)    Panic  attack    Outpatient Medications Prior to Visit  Medication Sig Dispense Refill   albuterol (PROVENTIL) (2.5 MG/3ML) 0.083% nebulizer solution Take 3 mLs (2.5 mg total) by nebulization every 6 (six) hours as needed for wheezing or shortness of breath. 75 mL 3   albuterol (VENTOLIN HFA) 108 (90 Base) MCG/ACT inhaler INHALE 2 PUFFS INTO THE LUNGS EVERY 6 HOURS AS NEEDED FOR WHEEZING OR SHORTNESS OF BREATH 18 g 1   amitriptyline (ELAVIL) 10 MG tablet Take 1 tablet (10 mg total) by mouth at bedtime. 90 tablet 1   amoxicillin-clavulanate (AUGMENTIN) 875-125 MG tablet Take 1 tablet by mouth every 12 (twelve) hours.     benzonatate (TESSALON) 200 MG capsule Take 1 capsule (200 mg total) by mouth 2 (two) times daily as needed for cough. 20 capsule 0   Blood Glucose Monitoring Suppl (ONETOUCH VERIO) w/Device KIT 1 each by Does not apply route daily. Use to monitor glucose levels daily; E11.9 1 kit 0   cetirizine (ZYRTEC) 10 MG tablet TAKE 1 TABLET BY MOUTH AT BEDTIME (Patient taking differently: Take 10 mg by mouth at bedtime.) 30 tablet 0   chlorpheniramine-HYDROcodone (TUSSIONEX) 10-8 MG/5ML Take 5 mLs by mouth 2 (two) times daily. 473 mL 0   Continuous Blood Gluc Receiver (FREESTYLE LIBRE 2 READER) DEVI USE TO CHECK BLOOD SUGAR THREE TIMES DAILY. 1 each 0   Continuous Blood Gluc Sensor (FREESTYLE LIBRE 2 SENSOR) MISC Use to check blood sugar three times daily. Change sensor once every 14 days. E11.9 2 each 3   dapagliflozin propanediol (FARXIGA) 10 MG TABS tablet Take 1 tablet (10 mg total) by mouth daily before breakfast. 90 tablet 1   diclofenac Sodium (VOLTAREN) 1 % GEL Apply 4 g topically 4 (four) times daily. 100 g 1   gabapentin (NEURONTIN) 600 MG tablet Take 600 mg by mouth 4 (four) times daily.     glucose blood (ONETOUCH VERIO) test strip Use to check glucose levels TID. 100 each 2   hydrOXYzine (ATARAX) 25 MG tablet Take  1 tablet (25 mg total) by mouth 3 (three) times daily as needed. for  anxiety 180 tablet 1   Insulin Pen Needle 31G X 8 MM MISC Inject 1 Units as directed daily.     melatonin 3 MG TABS tablet Take 1 tablet (3 mg total) by mouth at bedtime. 30 tablet 3   Misc. Devices MISC Nebulizer device. Diagnosis - Asthma 1 each 0   Misc. Devices MISC Shower chair 1 each 0   mometasone-formoterol (DULERA) 200-5 MCG/ACT AERO Inhale 2 puffs into the lungs in the morning and at bedtime. 1 each 6   montelukast (SINGULAIR) 10 MG tablet Take 1 tablet (10 mg total) by mouth at bedtime. 90 tablet 1   ondansetron (ZOFRAN) 4 MG tablet TAKE 1 TABLET BY MOUTH EVERY 8 HOURS AS NEEDED FOR NAUSEA FOR VOMITING 30 tablet 0   OneTouch Delica Lancets 09F MISC 1 each by Does not apply route daily. Use to monitor glucose levels daily; E11.9 100 each 2   oxyCODONE-acetaminophen (PERCOCET) 10-325 MG tablet Take 1 tablet by mouth 5 (five) times daily as needed.     pantoprazole (PROTONIX) 40 MG tablet Take 1 tablet (40 mg total) by mouth 2 (two) times daily. 180 tablet 1   pregabalin (LYRICA) 50 MG capsule Take 1 capsule (50 mg total) by mouth in the morning, at noon, and at bedtime. 90 capsule 0   rosuvastatin (CRESTOR) 20 MG tablet Take 1 tablet (20 mg total) by mouth daily. 90 tablet 1   sertraline (ZOLOFT) 100 MG tablet Take 1 tablet (100 mg total) by mouth at bedtime. 90 tablet 1   tiZANidine (ZANAFLEX) 4 MG tablet TAKE 1 TABLET BY MOUTH EVERY 8 HOURS AS NEEDED FOR MUSCLE SPASM 60 tablet 0   Vitamin D, Ergocalciferol, (DRISDOL) 1.25 MG (50000 UNIT) CAPS capsule Take 1 capsule (50,000 Units total) by mouth every 7 (seven) days. 5 capsule 0   amLODipine (NORVASC) 5 MG tablet Take 1 tablet (5 mg total) by mouth daily.     buPROPion (WELLBUTRIN SR) 150 MG 12 hr tablet Take 1 tablet (150 mg total) by mouth 2 (two) times daily. 180 tablet 1   furosemide (LASIX) 20 MG tablet Take 1 tablet (20 mg total) by mouth daily. 90 tablet 1   insulin degludec (TRESIBA FLEXTOUCH) 200 UNIT/ML FlexTouch Pen Inject 100  Units into the skin daily. 54 mL 3   No facility-administered medications prior to visit.     ROS Review of Systems  Constitutional:  Negative for activity change and appetite change.  HENT:  Negative for sinus pressure and sore throat.   Respiratory:  Positive for cough. Negative for chest tightness, shortness of breath and wheezing.   Cardiovascular:  Negative for chest pain and palpitations.  Gastrointestinal:  Positive for abdominal pain, nausea and vomiting. Negative for abdominal distention and constipation.  Genitourinary: Negative.   Musculoskeletal: Negative.   Psychiatric/Behavioral:  Negative for behavioral problems and dysphoric mood.     Objective:  BP (!) 144/91   Pulse (!) 110   Ht '5\' 1"'$  (1.549 m)   Wt 244 lb 9.6 oz (110.9 kg)   LMP  (LMP Unknown)   SpO2 100%   BMI 46.22 kg/m      10/09/2022    9:42 AM 09/10/2022   10:52 AM 08/20/2022    5:45 PM  BP/Weight  Systolic BP 818 299 371  Diastolic BP 91 696 789  Wt. (Lbs) 244.6 247   BMI 46.22 kg/m2 46.67 kg/m2  Physical Exam Constitutional:      Appearance: She is well-developed. She is obese.  Neck:     Comments: Tracheostomy in place Cardiovascular:     Rate and Rhythm: Tachycardia present.     Heart sounds: Normal heart sounds. No murmur heard. Pulmonary:     Effort: Pulmonary effort is normal.     Breath sounds: Normal breath sounds. No wheezing or rales.  Chest:     Chest wall: No tenderness.  Abdominal:     General: Bowel sounds are normal. There is no distension.     Palpations: Abdomen is soft. There is no mass.     Tenderness: There is no abdominal tenderness.  Musculoskeletal:        General: Normal range of motion.     Right lower leg: No edema.     Left lower leg: No edema.  Neurological:     Mental Status: She is alert and oriented to person, place, and time.  Psychiatric:        Mood and Affect: Mood normal.        Latest Ref Rng & Units 08/19/2022    6:45 AM  08/18/2022    9:54 AM 08/16/2022    2:00 AM  CMP  Glucose 70 - 99 mg/dL 165  225    BUN 6 - 20 mg/dL 16  28    Creatinine 0.44 - 1.00 mg/dL 1.17  1.88    Sodium 135 - 145 mmol/L 139  138    Potassium 3.5 - 5.1 mmol/L 4.0  4.0    Chloride 98 - 111 mmol/L 106  107    CO2 22 - 32 mmol/L 25  23    Calcium 8.9 - 10.3 mg/dL 8.8  8.5    Total Protein 6.5 - 8.1 g/dL 7.6  7.0  8.0   Total Bilirubin 0.3 - 1.2 mg/dL 0.3  0.5  0.4   Alkaline Phos 38 - 126 U/L 111  105  104   AST 15 - 41 U/L '25  26  29   '$ ALT 0 - 44 U/L '27  26  31     '$ Lipid Panel     Component Value Date/Time   CHOL 224 (H) 07/09/2022 1106   TRIG 142 07/09/2022 1106   HDL 50 07/09/2022 1106   CHOLHDL 3.2 06/15/2018 1031   CHOLHDL 3.9 02/22/2016 0954   VLDL 26 02/22/2016 0954   LDLCALC 148 (H) 07/09/2022 1106    CBC    Component Value Date/Time   WBC 7.5 08/19/2022 0645   RBC 4.15 08/19/2022 0645   HGB 11.8 (L) 08/19/2022 0645   HGB 11.9 04/12/2018 1035   HCT 37.2 08/19/2022 0645   HCT 34.8 04/12/2018 1035   PLT 380 08/19/2022 0645   PLT 373 04/12/2018 1035   MCV 89.6 08/19/2022 0645   MCV 84 04/12/2018 1035   MCH 28.4 08/19/2022 0645   MCHC 31.7 08/19/2022 0645   RDW 14.6 08/19/2022 0645   RDW 14.6 04/12/2018 1035   LYMPHSABS 4.4 (H) 08/18/2022 0954   LYMPHSABS 4.0 (H) 04/12/2018 1035   MONOABS 0.6 08/18/2022 0954   EOSABS 0.1 08/18/2022 0954   EOSABS 0.0 04/12/2018 1035   BASOSABS 0.0 08/18/2022 0954   BASOSABS 0.0 04/12/2018 1035    Lab Results  Component Value Date   HGBA1C 8.1 (A) 10/09/2022    Assessment & Plan:  1. Type 1 diabetes mellitus with other specified complication (HCC) Uncontrolled with A1c of 8.1 but this has  improved compared to 9.5 previously Due to gastrointestinal symptoms with GLP-1 RA I will discontinue her Ozempic Increase Tresiba from 160 to 170 units and NovoLog sliding scale added For blood sugars 0-150 give 0 units of insulin, 151-200 give 2 units of insulin, 201-250  give 4 units, 251-300 give 6 units, 301-350 give 8 units, 351-400 give 10 units,> 400 give 12 units and call M.D. Discussed hypoglycemia protocol. Advised that if hypoglycemia occurs she should decrease from 170 x 2 units Counseled on Diabetic diet, my plate method, 093 minutes of moderate intensity exercise/week Blood sugar logs with fasting goals of 80-120 mg/dl, random of less than 180 and in the event of sugars less than 60 mg/dl or greater than 400 mg/dl encouraged to notify the clinic. Advised on the need for annual eye exams, annual foot exams, Pneumonia vaccine. - POCT glucose (manual entry) - POCT glycosylated hemoglobin (Hb A1C) - insulin aspart (NOVOLOG FLEXPEN) 100 UNIT/ML FlexPen; Inject 0-12 Units into the skin 3 (three) times daily with meals. Per sliding scale  Dispense: 15 mL; Refill: 11 - insulin degludec (TRESIBA FLEXTOUCH) 200 UNIT/ML FlexTouch Pen; Inject 170 Units into the skin daily.  Dispense: 54 mL; Refill: 3 - Microalbumin / creatinine urine ratio - LP+Non-HDL Cholesterol - CMP14+EGFR  2. Mild episode of recurrent major depressive disorder ([place]) Uncontrolled Exacerbated by underlying chronic medical conditions and recent hospitalizations Will refer to LCSW for psychotherapy - buPROPion (WELLBUTRIN SR) 150 MG 12 hr tablet; Take 1 tablet (150 mg total) by mouth 2 (two) times daily.  Dispense: 180 tablet; Refill: 1  3. AKI (acute kidney injury) ([place]) Lisinopril and Lasix were discontinued during hospitalization Will check creatinine today  4. Hypertension associated with diabetes ([place]) Uncontrolled Amlodipine increased from 5 mg to 10 mg Counseled on blood pressure goal of less than 130/80, low-sodium, DASH diet, medication compliance, 150 minutes of moderate intensity exercise per week. Discussed medication compliance, adverse effects. - amLODipine (NORVASC) 10 MG tablet; Take 1 tablet (10 mg total) by mouth daily.  Dispense: 90 tablet; Refill: 1  5.  Hyperlipidemia due to type 1 diabetes mellitus ([place]) Uncontrolled Will check lipid panel and adjust statin dose accordingly Low-cholesterol diet  6. Bulging lumbar disc Chronic pain Status post lumbar spine surgery Will refer for aquatic therapy - Ambulatory referral to Physical Therapy  7. Morbid (severe) obesity due to excess calories ([place]) Advised that with discontinuation of GLP-1 RA she is likely to gain more weight She was referred to the medical weight management clinic by her orthopedic  8. Hemorrhage from tracheostomy stoma [facility]) Has upcoming appointment with ENT    Meds ordered this encounter  Medications   amLODipine (NORVASC) 10 MG tablet    Sig: Take 1 tablet (10 mg total) by mouth daily.    Dispense:  90 tablet    Refill:  1    Dose increase   insulin aspart (NOVOLOG FLEXPEN) 100 UNIT/ML FlexPen    Sig: Inject 0-12 Units into the skin 3 (three) times daily with meals. Per sliding scale    Dispense:  15 mL    Refill:  11    For blood sugars 0-150 give 0 units of insulin, 151-200 give 2 units of insulin, 201-250 give 4 units, 251-300 give 6 units, 301-350 give 8 units, 351-400 give 10 units,> 400 give 12 units   insulin degludec (TRESIBA FLEXTOUCH) 200 UNIT/ML FlexTouch Pen    Sig: Inject 170 Units into the skin daily.    Dispense:  54 mL  Refill:  3    Dose increase   buPROPion (WELLBUTRIN SR) 150 MG 12 hr tablet    Sig: Take 1 tablet (150 mg total) by mouth 2 (two) times daily.    Dispense:  180 tablet    Refill:  1    Follow-up: Return in about 3 months (around 01/08/2023) for Chronic medical conditions, 2nd dose shingrix.       [name], MD, FAAFP. [facility] and [place], [place]   10/09/2022, 1:44 PM

## 2022-10-10 ENCOUNTER — Other Ambulatory Visit: Payer: Self-pay

## 2022-10-10 ENCOUNTER — Other Ambulatory Visit: Payer: Self-pay | Admitting: Family Medicine

## 2022-10-10 ENCOUNTER — Other Ambulatory Visit: Payer: Self-pay | Admitting: Internal Medicine

## 2022-10-10 ENCOUNTER — Other Ambulatory Visit: Payer: Self-pay | Admitting: Pharmacist

## 2022-10-10 DIAGNOSIS — E119 Type 2 diabetes mellitus without complications: Secondary | ICD-10-CM

## 2022-10-10 DIAGNOSIS — Z79899 Other long term (current) drug therapy: Secondary | ICD-10-CM | POA: Diagnosis not present

## 2022-10-10 LAB — CMP14+EGFR
ALT: 15 IU/L (ref 0–32)
AST: 13 IU/L (ref 0–40)
Albumin/Globulin Ratio: 1.3 (ref 1.2–2.2)
Albumin: 4.3 g/dL (ref 3.8–4.9)
Alkaline Phosphatase: 128 IU/L — ABNORMAL HIGH (ref 44–121)
BUN/Creatinine Ratio: 17 (ref 9–23)
BUN: 17 mg/dL (ref 6–24)
Bilirubin Total: 0.3 mg/dL (ref 0.0–1.2)
CO2: 22 mmol/L (ref 20–29)
Calcium: 9.7 mg/dL (ref 8.7–10.2)
Chloride: 102 mmol/L (ref 96–106)
Creatinine, Ser: 0.99 mg/dL (ref 0.57–1.00)
Globulin, Total: 3.2 g/dL (ref 1.5–4.5)
Glucose: 159 mg/dL — ABNORMAL HIGH (ref 70–99)
Potassium: 4.6 mmol/L (ref 3.5–5.2)
Sodium: 141 mmol/L (ref 134–144)
Total Protein: 7.5 g/dL (ref 6.0–8.5)
eGFR: 68 mL/min/{1.73_m2} (ref >59)

## 2022-10-10 LAB — MICROALBUMIN / CREATININE URINE RATIO
Creatinine, Urine: 261.1 mg/dL
Microalb/Creat Ratio: 12 mg/g creat (ref 0–29)
Microalbumin, Urine: 31.7 ug/mL

## 2022-10-10 LAB — LP+NON-HDL CHOLESTEROL
Cholesterol, Total: 144 mg/dL (ref 100–199)
HDL: 54 mg/dL (ref >39)
LDL Chol Calc (NIH): 68 mg/dL (ref 0–99)
Total Non-HDL-Chol (LDL+VLDL): 90 mg/dL (ref 0–129)
Triglycerides: 124 mg/dL (ref 0–149)
VLDL Cholesterol Cal: 22 mg/dL (ref 5–40)

## 2022-10-10 MED ORDER — INSULIN LISPRO (1 UNIT DIAL) 100 UNIT/ML (KWIKPEN): PEN_INJECTOR | SUBCUTANEOUS | 6 refills | Status: DC

## 2022-10-13 ENCOUNTER — Telehealth: Payer: Self-pay | Admitting: Radiology

## 2022-10-13 MED ORDER — TIZANIDINE HCL 4 MG PO TABS: 4.0000 mg | ORAL_TABLET | Freq: Three times a day (TID) | ORAL | 2 refills | Status: DC | PRN

## 2022-10-13 NOTE — Telephone Encounter (Signed)
Unable to refill per protocol, Rx request was refilled 10/13/22 for 90 days. Will refuse duplicate request.  Requested Prescriptions  Pending Prescriptions Disp Refills   tiZANidine (ZANAFLEX) 4 MG tablet [Pharmacy Med Name: tiZANidine HCl 4 MG Oral Tablet] 60 tablet 0    Sig: TAKE 1 TABLET BY MOUTH EVERY 8 HOURS AS NEEDED FOR MUSCLE SPASM     Not Delegated - Cardiovascular:  Alpha-2 Agonists - tizanidine Failed - 10/10/2022  6:16 PM      Failed - This refill cannot be delegated      Passed - Valid encounter within last 6 months    Recent Outpatient Visits           4 days ago Type 1 diabetes mellitus with other specified complication [facility])   [place], [place], MD   3 months ago Type 1 diabetes mellitus with other specified complication [facility])   [place], [place], MD   6 months ago Type 1 diabetes mellitus with other specified complication [facility])   [place], Charlane Ferretti, MD   9 months ago Encounter for [employer] annual wellness exam   [place], Charlane Ferretti, MD   11 months ago Hypertension associated with diabetes [facility])   [place] [name], MD       Future Appointments             In 3 months [name], MD [place]            Signed Prescriptions Disp Refills   Continuous Blood Gluc Sensor (FREESTYLE LIBRE 2 SENSOR) MISC 2 each 0    Sig: USE TO CHECK BLOOD SUGAR THREE TIMES DAILY. CHANGE SENSOR ONCE EVERY 14 DAYS.     Endocrinology: Diabetes - Testing Supplies Passed - 10/10/2022  6:16 PM      Passed - Valid encounter within last 12 months    Recent Outpatient Visits           4 days ago Type 1 diabetes mellitus with other specified complication [facility])   [place], [place], MD    3 months ago Type 1 diabetes mellitus with other specified complication [facility])   [place], [place], MD   6 months ago Type 1 diabetes mellitus with other specified complication [facility])   [place] [name], MD   9 months ago Encounter for [employer] annual wellness exam   [place], Charlane Ferretti, MD   11 months ago Hypertension associated with diabetes [facility])   [place] [name], MD       Future Appointments             In 3 months [name], MD [place]

## 2022-10-13 NOTE — Telephone Encounter (Signed)
Received fax request for Pregabalin '50mg'$  capsule, a by mouth in the morning, at noon, and at bedtime.  #90. Last filled by Dr. Louanne Skye in September.  OK to refill?

## 2022-10-13 NOTE — Telephone Encounter (Signed)
Hey, Please contact pt she is [facility] and is looking to speak with someone once a month for her depression. I spoke with her briefly and she dealing with some grief and depression, but overall is doing well.

## 2022-10-13 NOTE — Telephone Encounter (Signed)
Requested Prescriptions  Pending Prescriptions Disp Refills   tiZANidine (ZANAFLEX) 4 MG tablet [Pharmacy Med Name: tiZANidine HCl 4 MG Oral Tablet] 60 tablet 0    Sig: TAKE 1 TABLET BY MOUTH EVERY 8 HOURS AS NEEDED FOR MUSCLE SPASM     Not Delegated - Cardiovascular:  Alpha-2 Agonists - tizanidine Failed - 10/10/2022  6:16 PM      Failed - This refill cannot be delegated      Passed - Valid encounter within last 6 months    Recent Outpatient Visits           4 days ago Type 1 diabetes mellitus with other specified complication [facility])   [place], [place], MD   3 months ago Type 1 diabetes mellitus with other specified complication [facility])   [place], [place], MD   6 months ago Type 1 diabetes mellitus with other specified complication [facility])   [place] [name], MD   9 months ago Encounter for [employer] annual wellness exam   [place], Charlane Ferretti, MD   11 months ago Hypertension associated with diabetes [facility])   [place] [name], MD       Future Appointments             In 3 months [name], MD [place]             Continuous Blood Gluc Sensor ([place] 2 SENSOR) MISC [Pharmacy Med Name: FREESTYLE LIBRE 2 SENSOR KIT] 2 each 0    Sig: USE TO [place]. CHANGE SENSOR ONCE EVERY 14 DAYS.     Endocrinology: Diabetes - Testing Supplies Passed - 10/10/2022  6:16 PM      Passed - Valid encounter within last 12 months    Recent Outpatient Visits           4 days ago Type 1 diabetes mellitus with other specified complication [facility])   [place], [place], MD   3 months ago Type 1 diabetes mellitus with other specified complication [facility])   [place], [place], MD   6 months ago Type 1 diabetes mellitus with other specified complication [facility])   [place] [name], MD   9 months ago Encounter for [employer] annual wellness exam   [place], Charlane Ferretti, MD   11 months ago Hypertension associated with diabetes [facility])   [place] [name], MD       Future Appointments             In 3 months [name], MD [place]

## 2022-10-14 ENCOUNTER — Other Ambulatory Visit: Payer: Self-pay | Admitting: Orthopaedic Surgery

## 2022-10-14 MED ORDER — PREGABALIN 50 MG PO CAPS: 50.0000 mg | ORAL_CAPSULE | Freq: Three times a day (TID) | ORAL | 0 refills | Status: DC

## 2022-10-14 MED ORDER — PREGABALIN 50 MG PO CAPS: 50.0000 mg | ORAL_CAPSULE | Freq: Three times a day (TID) | ORAL | 3 refills | Status: DC

## 2022-10-14 NOTE — Telephone Encounter (Signed)
noted 

## 2022-10-14 NOTE — Telephone Encounter (Signed)
Sent to pharmacy 

## 2022-10-14 NOTE — Telephone Encounter (Signed)
Could you please refill this? I have tried and it will not let me send in electronically.

## 2022-10-14 NOTE — Addendum Note (Signed)
Addended by: Meyer Cory on: 10/14/2022 08:59 AM   Modules accepted: Orders

## 2022-10-14 NOTE — Addendum Note (Signed)
Addended by: Meyer Cory on: 10/14/2022 08:55 AM   Modules accepted: Orders

## 2022-10-16 ENCOUNTER — Telehealth: Payer: Self-pay | Admitting: Licensed Clinical Social Worker

## 2022-10-16 NOTE — Patient Instructions (Signed)
Visit Information  Thank you for taking time to visit with me today. Please don't hesitate to contact me if I can be of assistance to you.   Following are the goals we discussed today:   Goals Addressed             This Visit's Progress    Obtain Supportive Resources/Management of MH conditions   On track    Care Coordination Interventions: Solution-Focused Strategies employed:  Active listening / Reflection utilized  Emotional Support Provided Verbalization of feelings encouraged  LCSW informed patient of care coordination services. Pt is interested at this time and agreed to schedule initial appt with LCSW         Our next appointment is by telephone on 10/22/22 at 3:45 PM  Please call the care guide team at [phone] if you need to cancel or reschedule your appointment.   If you are experiencing a Mental Health or [place] or need someone to talk to, please call the Suicide and Crisis Lifeline: 988 call 911   Patient verbalizes understanding of instructions and care plan provided today and agrees to view in [place]. Active MyChart status and patient understanding of how to access instructions and care plan via MyChart confirmed with patient.     [name], MSW, [email or web address] Phone [phone] 6:31 PM

## 2022-10-16 NOTE — Patient Outreach (Signed)
  Care Coordination   Initial Visit Note   10/16/2022 Name: [name] MRN: [id number] DOB: 10/02/1970  [name] is a 52 y.o. year old female who sees [name], MD for primary care. I spoke with  Myeisha [name] by phone today.  What matters to the patients health and wellness today?  Care Coordination    Goals Addressed             This Visit's Progress    Obtain Supportive Resources/Management of MH conditions   On track    Care Coordination Interventions: Solution-Focused Strategies employed:  Active listening / Reflection utilized  Emotional Support Provided Verbalization of feelings encouraged  LCSW informed patient of care coordination services. Pt is interested at this time and agreed to schedule initial appt with LCSW         SDOH assessments and interventions completed:  No     Care Coordination Interventions:  Yes, provided   Follow up plan: Follow up call scheduled for 1 week    Encounter Outcome:  Pt. Visit Completed   Christa See, MSW, [email or web address] Phone [phone] 6:30 PM

## 2022-10-17 ENCOUNTER — Other Ambulatory Visit: Payer: Self-pay | Admitting: Family Medicine

## 2022-10-17 ENCOUNTER — Other Ambulatory Visit: Payer: Self-pay

## 2022-10-17 MED ORDER — INSULIN PEN NEEDLE 31G X 8 MM MISC: 1.0000 [IU] | Freq: Four times a day (QID) | 11 refills | Status: DC

## 2022-10-17 MED ORDER — INSULIN PEN NEEDLE 31G X 8 MM MISC: 1.0000 [IU] | Freq: Every day | 11 refills | Status: DC

## 2022-10-21 DIAGNOSIS — Z930 Tracheostomy status: Secondary | ICD-10-CM | POA: Diagnosis not present

## 2022-10-21 DIAGNOSIS — Z87891 Personal history of nicotine dependence: Secondary | ICD-10-CM | POA: Diagnosis not present

## 2022-10-21 DIAGNOSIS — J386 Stenosis of larynx: Secondary | ICD-10-CM | POA: Diagnosis not present

## 2022-10-22 ENCOUNTER — Ambulatory Visit: Payer: Self-pay | Admitting: Licensed Clinical Social Worker

## 2022-10-22 ENCOUNTER — Telehealth: Payer: Self-pay | Admitting: Radiology

## 2022-10-22 NOTE — Telephone Encounter (Signed)
Received fax from [place] at [facility] requesting refill of Aspirin EC 81 mg tab. Take one tablet by mouth once daily. Swallow whole. #30.  Prescribed by Dr. Louanne Skye 08/26/2021, last fill date 06/04/2022.  Please advise.

## 2022-10-22 NOTE — Telephone Encounter (Signed)
Denial faxed back to pharmacy.

## 2022-10-24 NOTE — Patient Instructions (Signed)
Visit Information  Thank you for taking time to visit with me today. Please don't hesitate to contact me if I can be of assistance to you.   Following are the goals we discussed today:   Goals Addressed   None     Our next appointment is by telephone on 10/29/22 at 3:30 PM  Please call the care guide team at [phone] if you need to cancel or reschedule your appointment.   If you are experiencing a Mental Health or [place] or need someone to talk to, please call the Suicide and Crisis Lifeline: 988 call 911   Patient verbalizes understanding of instructions and care plan provided today and agrees to view in [place]. Active MyChart status and patient understanding of how to access instructions and care plan via MyChart confirmed with patient.     Christa See, MSW, [email or web address] Phone [phone] 6:11 AM

## 2022-10-24 NOTE — Patient Outreach (Signed)
  Care Coordination Late Entry  Initial Visit Note   Outreach completed on 10/22/22 Name: [name] MRN: [id number] DOB: October 21, 1970  [name] is a 52 y.o. year old female who sees [name], MD for primary care. I spoke with  [name] by phone today.  What matters to the patients health and wellness today?  Patient requested to re-schedule due to feeling ill     SDOH assessments and interventions completed:  No     Care Coordination Interventions:  Yes, provided   Follow up plan: Follow up call scheduled for 1-2 weeks    Encounter Outcome:  Pt. Visit Completed   Christa See, MSW, [email or web address] Phone [phone] 6:10 AM

## 2022-10-29 ENCOUNTER — Ambulatory Visit: Payer: Self-pay | Admitting: Licensed Clinical Social Worker

## 2022-10-30 NOTE — Patient Outreach (Signed)
  Care Coordination   Initial Visit Note   10/29/22 Name: Aliciana Ricciardi [name] MRN: [id number] DOB: 03/06/1971  Bertram Millard Mae [name] is a 52 y.o. year old female who sees [name], MD for primary care. I spoke with  Ellee Mae [name] by phone today.  What matters to the patients health and wellness today?  Symptom Management    Goals Addressed             This Visit's Progress    Obtain Supportive Resources/Management of MH conditions   On track    Care Coordination Interventions: Solution-Focused Strategies employed:  Active listening / Reflection utilized  Emotional Support Provided Verbalization of feelings encouraged  LCSW informed patient of care coordination services. Pt is interested at this time and agreed to schedule initial appt with LCSW         SDOH assessments and interventions completed:  No     Care Coordination Interventions:  Yes, provided   Follow up plan: Follow up call scheduled for 2-4 weeks    Encounter Outcome:  Pt. Visit Completed   Christa See, MSW, [email or web address] Phone [phone] 11:54 AM

## 2022-10-30 NOTE — Patient Instructions (Signed)
Visit Information  Thank you for taking time to visit with me today. Please don't hesitate to contact me if I can be of assistance to you.   Following are the goals we discussed today:   Goals Addressed             This Visit's Progress    Obtain Supportive Resources/Management of MH conditions   On track    Care Coordination Interventions: Solution-Focused Strategies employed:  Active listening / Reflection utilized  Emotional Support Provided Verbalization of feelings encouraged  LCSW informed patient of care coordination services. Pt is interested at this time and agreed to schedule initial appt with LCSW         Our next appointment is by telephone on 11/14/22 at 3:30 PM  Please call the care guide team at [phone] if you need to cancel or reschedule your appointment.   If you are experiencing a Mental Health or [place] or need someone to talk to, please call the Suicide and Crisis Lifeline: 988 call 911   Patient verbalizes understanding of instructions and care plan provided today and agrees to view in [place]. Active MyChart status and patient understanding of how to access instructions and care plan via MyChart confirmed with patient.     Christa See, MSW, [email or web address] Phone [phone] 11:56 AM

## 2022-11-02 ENCOUNTER — Other Ambulatory Visit: Payer: Self-pay | Admitting: Family Medicine

## 2022-11-02 DIAGNOSIS — R112 Nausea with vomiting, unspecified: Secondary | ICD-10-CM

## 2022-11-02 DIAGNOSIS — E119 Type 2 diabetes mellitus without complications: Secondary | ICD-10-CM

## 2022-11-06 DIAGNOSIS — J449 Chronic obstructive pulmonary disease, unspecified: Secondary | ICD-10-CM | POA: Diagnosis not present

## 2022-11-06 DIAGNOSIS — M79604 Pain in right leg: Secondary | ICD-10-CM | POA: Diagnosis not present

## 2022-11-06 DIAGNOSIS — G4733 Obstructive sleep apnea (adult) (pediatric): Secondary | ICD-10-CM | POA: Diagnosis not present

## 2022-11-06 DIAGNOSIS — E7800 Pure hypercholesterolemia, unspecified: Secondary | ICD-10-CM | POA: Diagnosis not present

## 2022-11-06 DIAGNOSIS — Z1211 Encounter for screening for malignant neoplasm of colon: Secondary | ICD-10-CM | POA: Diagnosis not present

## 2022-11-06 DIAGNOSIS — Z79899 Other long term (current) drug therapy: Secondary | ICD-10-CM | POA: Diagnosis not present

## 2022-11-06 DIAGNOSIS — Z1231 Encounter for screening mammogram for malignant neoplasm of breast: Secondary | ICD-10-CM | POA: Diagnosis not present

## 2022-11-06 DIAGNOSIS — M79605 Pain in left leg: Secondary | ICD-10-CM | POA: Diagnosis not present

## 2022-11-06 DIAGNOSIS — J9610 Chronic respiratory failure, unspecified whether with hypoxia or hypercapnia: Secondary | ICD-10-CM | POA: Diagnosis not present

## 2022-11-06 DIAGNOSIS — M5416 Radiculopathy, lumbar region: Secondary | ICD-10-CM | POA: Diagnosis not present

## 2022-11-06 DIAGNOSIS — R030 Elevated blood-pressure reading, without diagnosis of hypertension: Secondary | ICD-10-CM | POA: Diagnosis not present

## 2022-11-06 DIAGNOSIS — E119 Type 2 diabetes mellitus without complications: Secondary | ICD-10-CM | POA: Diagnosis not present

## 2022-11-06 DIAGNOSIS — K0889 Other specified disorders of teeth and supporting structures: Secondary | ICD-10-CM | POA: Diagnosis not present

## 2022-11-06 DIAGNOSIS — J45909 Unspecified asthma, uncomplicated: Secondary | ICD-10-CM | POA: Diagnosis not present

## 2022-11-06 DIAGNOSIS — R0989 Other specified symptoms and signs involving the circulatory and respiratory systems: Secondary | ICD-10-CM | POA: Diagnosis not present

## 2022-11-07 ENCOUNTER — Other Ambulatory Visit: Payer: Self-pay | Admitting: Family Medicine

## 2022-11-07 ENCOUNTER — Telehealth: Payer: Self-pay | Admitting: Emergency Medicine

## 2022-11-07 ENCOUNTER — Other Ambulatory Visit: Payer: Self-pay | Admitting: Pharmacist

## 2022-11-07 ENCOUNTER — Ambulatory Visit: Payer: Self-pay | Admitting: *Deleted

## 2022-11-07 MED ORDER — INSULIN PEN NEEDLE 31G X 8 MM MISC: 1.0000 [IU] | Freq: Four times a day (QID) | 11 refills | Status: DC

## 2022-11-07 MED ORDER — INSULIN PEN NEEDLE 31G X 8 MM MISC: 11 refills | Status: DC

## 2022-11-07 NOTE — Telephone Encounter (Signed)
  Chief Complaint: Pharmacy needing the pen needles ordered as a quantity like by the box rather than "each".   Also wanting to know if they can switch to the BD brand of pen needles. Symptoms: N/A Frequency: N/A Pertinent Negatives: Patient denies N/A Disposition: []$ ED /[]$ Urgent Care (no appt availability in office) / []$ Appointment(In office/virtual)/ []$  [place] Virtual Care/ []$ Home Care/ []$ Refused Recommended Disposition /[]$ [place] Mobile Bus/ [x]$  Follow-up with PCP Additional Notes: Message sent to [facility]

## 2022-11-07 NOTE — Telephone Encounter (Signed)
Reason for Disposition  [1] Pharmacy calling with prescription question AND [2] triager unable to answer question  Answer Assessment - Initial Assessment Questions 1. NAME of MEDICINE: "What medicine(s) are you calling about?"     [place] Verify quantity of BD pen needles needed.   2. QUESTION: "What is your question?" (e.g., double dose of medicine, side effect)     I need to know quantity of pen needles.   It needs to be ordered by the box or quantity.   Not "each"   Is it ok to change to BD brand? Is it one box or what?   3. PRESCRIBER: "Who prescribed the medicine?" Reason: if prescribed by specialist, call should be referred to that group.     Dr.  Margarita Rana 4. SYMPTOMS: "Do you have any symptoms?" If Yes, ask: "What symptoms are you having?"  "How bad are the symptoms (e.g., mild, moderate, severe)     N/A 5. PREGNANCY:  "Is there any chance that you are pregnant?" "When was your last menstrual period?"     N/A  Protocols used: Medication Question Call-A-AH

## 2022-11-07 NOTE — Telephone Encounter (Signed)
Patient is to use a max of 4 pen needles daily. Quantity was sent as #120 whereas it should have been sent for #100. Correct rx sent.

## 2022-11-07 NOTE — Telephone Encounter (Signed)
Copied from [place] [phone]. Topic: General - Other >> Nov 07, 2022  9:11 AM Sabas Sous wrote: Reason for CRM: Pharmacy called to verify the order for needles today, they have questions for the clinic.

## 2022-11-10 ENCOUNTER — Ambulatory Visit (HOSPITAL_BASED_OUTPATIENT_CLINIC_OR_DEPARTMENT_OTHER): Payer: Medicaid Other | Admitting: Physical Therapy

## 2022-11-13 DIAGNOSIS — Z430 Encounter for attention to tracheostomy: Secondary | ICD-10-CM | POA: Diagnosis not present

## 2022-11-14 ENCOUNTER — Encounter: Payer: Self-pay | Admitting: Licensed Clinical Social Worker

## 2022-11-17 ENCOUNTER — Telehealth: Payer: Self-pay | Admitting: Licensed Clinical Social Worker

## 2022-11-17 NOTE — Patient Outreach (Addendum)
  Care Coordination Late Entry  Attempted encounter completed on 11/14/22 Name: [name] MRN: [id number] DOB: 1971/04/21   Care Coordination Outreach Attempts:  An unsuccessful telephone outreach was attempted for a scheduled appointment today.  Follow Up Plan:  Additional outreach attempts will be made to offer the patient care coordination information and services.   Encounter Outcome:  No Answer   Care Coordination Interventions:  No, not indicated    Christa See, MSW, [email or web address] Phone [phone] 10:35 PM

## 2022-11-19 ENCOUNTER — Telehealth: Payer: Self-pay | Admitting: Licensed Clinical Social Worker

## 2022-11-19 NOTE — Patient Outreach (Signed)
  Care Coordination   11/19/2022 Name: [name] MRN: [id number] DOB: [birth year]   Care Coordination Outreach Attempts:  A second unsuccessful outreach was attempted today to offer the patient with information about available care coordination services as a benefit of their health plan.     Follow Up Plan:  Additional outreach attempts will be made to offer the patient care coordination information and services.   Encounter Outcome:  No Answer   Care Coordination Interventions:  No, not indicated    Christa See, MSW, [email or web address] Phone [phone] 1:37 PM

## 2022-11-20 DIAGNOSIS — R0989 Other specified symptoms and signs involving the circulatory and respiratory systems: Secondary | ICD-10-CM | POA: Diagnosis not present

## 2022-11-20 DIAGNOSIS — M79605 Pain in left leg: Secondary | ICD-10-CM | POA: Diagnosis not present

## 2022-11-20 DIAGNOSIS — M79604 Pain in right leg: Secondary | ICD-10-CM | POA: Diagnosis not present

## 2022-11-25 ENCOUNTER — Encounter (HOSPITAL_COMMUNITY): Payer: Self-pay | Admitting: Physician Assistant

## 2022-11-25 ENCOUNTER — Other Ambulatory Visit: Payer: Self-pay | Admitting: Family Medicine

## 2022-11-25 ENCOUNTER — Encounter (HOSPITAL_COMMUNITY): Payer: Self-pay | Admitting: Oral Surgery

## 2022-11-25 DIAGNOSIS — J4540 Moderate persistent asthma, uncomplicated: Secondary | ICD-10-CM

## 2022-11-25 DIAGNOSIS — E1159 Type 2 diabetes mellitus with other circulatory complications: Secondary | ICD-10-CM

## 2022-11-25 NOTE — H&P (Signed)
  Patient: [name]  PID: [id number]  DOB: 03-Apr-1971  SEX: Female   Patient referred by MD for extraction of tooth  CC: Tooth broke off and is painful.  Past Medical History:  High Blood Pressure, Chest Pain or Angina, Asthma, Chronic Pain, OSA, Diabetes, COPD, Chronic Fatigue Syndrome, Bronchitis, chronic cough, Anxiety, Tracheostomy, Arthritis, GERD, Morbid Obesity    Medications: Acetaminophen, Albuterol, Atorvastatin, benzonatate, Bupropion, Furosemide, Gabapentin, Hydroxyzine, insulin Degludec, Melatonin, mometasone formoterol, Farxiga, Aspirin, amitriptyline, Zyrtec, Lisinopril, Montelukast, Ondansetron, Oxycodone, Protonix, Lyrica, Crestor, Zoloft, Zanaflex    Allergies:     Reglan    Surgeries:   shoulder surgery, panendoscopy, Tracheostomy, Cholecystectomy, vesicovaginal fistula closure, abdominal hysterectomy, Appendectomy, C Section, Hernia repair, Hand surgery     Social History       Smoking:  n          Alcohol:n Drug use:n                             Exam: BMI 46. Fractured tooth #32. Mallampati 4.  No purulence, edema, fluctuance, trismus. Oral cancer screening negative. Pharynx clear. Trach tube in place.No lymphadenopathy.  Panorex: (from 12/12/2020) Erupted tooth #32.   Assessment: ASA 3. Non-restorable  tooth #32.             Plan: Extraction Tooth # 32.  Hospital Day surgery.                 Rx: n               Risks and complications explained. Questions answered.   Gae Bon, DMD

## 2022-11-25 NOTE — Progress Notes (Signed)
Internal Med/PCP - Dr [name] Cardiologist - n/a Endocrinology - Dr Renato Shin ENT - Dr Danice Goltz Gastroenterology - Dr Scarlette Shorts Otolaryngology - Dr Annie Main   Chest x-ray - 08/18/22 (2V) EKG - 08/19/22 Stress Test - >10 years ago in [facility], negative in care everywhere  ECHO - 06/04/17 Cardiac Cath - n/a  ICD Pacemaker/Loop - n/a  Sleep Study -  Yes (12/2011) CPAP - does not use CPAP due to trach. Uses 2L supplemental oxygen at night.   Diabetes Type 1 (Freestyle Libre) Do not take Farxiga on the morning of surgery.  (Hold 72 hours prior to procedure-SDW Call).  Last dose was on    Do not take Tresiba on the morning of surgery.      THE MORNING OF SURGERY, do not take Humalog Insulin unless your CBG is greater than 220 mg/dL.  If your CBG >220 mg/dL, you may take  of your sliding scale (correction) dose of insulin.  If your blood sugar is less than 70 mg/dL, you will need to treat for low blood sugar: Treat a low blood sugar (less than 70 mg/dL) with  cup of clear juice (cranberry or apple), 4 glucose tablets, OR glucose gel. Recheck blood sugar in 15 minutes after treatment (to make sure it is greater than 70 mg/dL). If your blood sugar is not greater than 70 mg/dL on recheck, call [phone] for further instructions.  NPO  Anesthesia review: Yes Jeneen Rinks, [place])  [name] in place.

## 2022-11-25 NOTE — Progress Notes (Addendum)
Anesthesia Chart Review: Same day workup  52 year old female with pertinent history including former smoker with associated COPD (quit 01/2017), tracheostomy (03/08/17 following glottic intubation injury and subsequent development of tracheal stenosis; seen Q 3 months at [facility] for management/trach exchange) , Severe OSA (no CPAP since trach, uses 2L supplemental O2 QHS), HTN, asthma (historically not well controlled, maintained on Dulera, Singulair, and as needed duoneb), GERD, chronic pain, obesity, insulin-dependent diabetes (PCP notes state both type 1 and type 2 in various notes. However, endocrinology notes state type 1 diabetes) last A1c 8.1 on 10/09/22.   Recent admission November 2023 for hemoptysis through tracheotomy.  She was initially admitted to [facility] and then transferred to [facility].  Per discharge summary from [facility] 08/23/2022, she was treated for presumed tracheitis.  She was also noted to have acute AKI which improved with IV hydration.  Her home lisinopril and Lasix were held and she was instructed to follow-up with her PCP.  She followed up with ENT Dr. Danice Goltz t [place] on 10/21/22. Per note, "[name] is a 52 y.o. female who developed likely intubation related tracheal injury and laryngeal edema necessitating tracheostomy on 03/08/17. She returned to the operating room on 05/18/17 for tracheostomy tube change and SMDL to evaluate subglottis. 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 She was referred to CT surgery for possible tracheal resection, however her stenotic segment was too close to the inferior surface of the glottis, therefore she was taken to the OR again with Dr. Hendricks Limes with plans for cricotracheal resection. Unfortunately, the stenotic segment was too long and too intimately associated with her innominate artery to proceed with resection. She went for repeat SMDL with dilation on  10/30/17 with minimal improvement of near complete subglottic stenosis. Update : Patient has worsened vocal fold and laryngeal stenosis limiting her ability to voice and cough. - Increase humdification at home - SLP evaluation for trach humidifcation (HME and PMV use) - Saline bullets 2 times a day to improve tracheal crusting - Recommend SMDL with laryngeal/subglottic dilation with trach change."   Patient will need day of surgery labs and evaluation.  Addendum: Reviewed pt's history with anesthesiologist Dr. Jillyn Hidden. He advised that due to her complex ENT history, future elective surgeries should be performed at a tertiary care center. I called and spoke with Dr. Hoyt Koch and he stated he would work on getting the pt to a provider at [facility].    EKG 08/19/2022: Sinus tachycardia.  Rate 102.  Low voltage QRS.  TFL/tracheoscopyProcedure Note -Transnasal fiberoptic laryngoscopy and tracheoscopy 10/21/2022 (Care Everywhere): Findings:  - Tracheostomy is well seated in the trachea.  - There is extensive posterior tracheal wall crusting and what appears to be some hair follicles. This was removed after saline bullet with patient laying flat and suctioning with 14 french - Otherwise the trachea does not appear erythematous and there is no bleeding.  - Large anterior nasal septal perforation - Normal appearance of nasopharynx, tongue oropharynx, epiglottis, and post-cricoid region  - Very limited vocal fold abduction. With sniff there is no abduction of vocal folds. There is only air escape with voicing. - The vocal folds are not visualized due to poor abduction, stable to slightly worse from prior    Chest 2 view 08/18/2022: COMPARISON:  Radiograph 10/29/2021   FINDINGS: Tracheostomy tube overlies trachea. Unchanged mildly enlarged cardiac silhouette. No focal airspace consolidation. No pleural effusion or pneumothorax. No acute osseous  abnormality. Thoracic spondylosis. Bilateral shoulder  degenerative changes.   IMPRESSION: No evidence of acute cardiopulmonary disease.  CTA chest abdomen pelvis 08/16/2022: IMPRESSION: No evidence of aortic dissection or aneurysmal dilatation.   No evidence of pulmonary emboli.   No acute abnormality to correspond with the given clinical history.   Chronic changes as described above.  Echo 06/04/17: Study Conclusions - Left ventricle: The cavity size was normal. Wall thickness was   normal. Systolic function was normal. The estimated ejection   fraction was in the range of 60% to 65%. Wall motion was normal;   there were no regional wall motion abnormalities. Doppler   parameters are consistent with abnormal left ventricular   relaxation (grade 1 diastolic dysfunction). - Mitral valve: Valve area by pressure half-time: 0.72 cm^2.      [name] [facility] Short Stay Center/Anesthesiology Phone [phone] 11/25/2022 10:26 AM

## 2022-11-25 NOTE — Anesthesia Preprocedure Evaluation (Signed)
Anesthesia Evaluation    Airway        Dental   Pulmonary former smoker          Cardiovascular hypertension,      Neuro/Psych    GI/Hepatic   Endo/Other  diabetes    Renal/GU      Musculoskeletal   Abdominal   Peds  Hematology   Anesthesia Other Findings   Reproductive/Obstetrics                              Anesthesia Physical Anesthesia Plan  ASA:   Anesthesia Plan:    Post-op Pain Management:    Induction:   PONV Risk Score and Plan:   Airway Management Planned:   Additional Equipment:   Intra-op Plan:   Post-operative Plan:   Informed Consent:   Plan Discussed with:   Anesthesia Plan Comments: (PAT note by Karoline Caldwell, PA-C: 52 year old female with pertinent history including former smoker with associated COPD (quit 01/2017), tracheostomy (03/08/17 following glottic intubation injury and subsequent development of tracheal stenosis; seen Q 3 months at [facility] for management/trach exchange) , Severe OSA (no CPAP since trach, uses 2L supplemental O2 QHS), HTN, asthma (historically not well controlled, maintained on Dulera, Singulair, and as needed duoneb), GERD, chronic pain, obesity, insulin-dependent diabetes (PCP notes state both type 1 and type 2 in various notes. However, endocrinology notes state type 1 diabetes) last A1c 8.1 on 10/09/22.   Recent admission November 2023 for hemoptysis through tracheotomy.  She was initially admitted to [facility] and then transferred to [facility].  Per discharge summary from [facility] 08/23/2022, she was treated for presumed tracheitis.  She was also noted to have acute AKI which improved with IV hydration.  Her home lisinopril and Lasix were held and she was instructed to follow-up with her PCP.  She followed up with ENT Dr. Danice Goltz t [place] on 10/21/22. Per note, "[name] is a 52 y.o. female who developed likely intubation  related tracheal injury and laryngeal edema necessitating tracheostomy on 03/08/17. She returned to the operating room on 05/18/17 for tracheostomy tube change and SMDL to evaluate subglottis. 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 She was referred to CT surgery for possible tracheal resection, however her stenotic segment was too close to the inferior surface of the glottis, therefore she was taken to the OR again with Dr. Hendricks Limes with plans for cricotracheal resection. Unfortunately, the stenotic segment was too long and too intimately associated with her innominate artery to proceed with resection. She went for repeat SMDL with dilation on 10/30/17 with minimal improvement of near complete subglottic stenosis. Update : Patient has worsened vocal fold and laryngeal stenosis limiting her ability to voice and cough. - Increase humdification at home - SLP evaluation for trach humidifcation (HME and PMV use) - Saline bullets 2 times a day to improve tracheal crusting - Recommend SMDL with laryngeal/subglottic dilation with trach change."   Patient will need day of surgery labs and evaluation.   EKG 08/19/2022: Sinus tachycardia.  Rate 102.  Low voltage QRS.  TFL/tracheoscopyProcedure Note -Transnasal fiberoptic laryngoscopy and tracheoscopy 10/21/2022 (Care Everywhere): Findings:  - Tracheostomy is well seated in the trachea.  - There is extensive posterior tracheal wall crusting and what appears to be some hair follicles. This was removed after saline bullet with patient laying flat and suctioning with 14 french -  Otherwise the trachea does not appear erythematous and there is no bleeding.  - Large anterior nasal septal perforation - Normal appearance of nasopharynx, tongue oropharynx, epiglottis, and post-cricoid region  - Very limited vocal fold abduction. With sniff there is no abduction of vocal folds. There is only air escape  with voicing. - The vocal folds are not visualized due to poor abduction, stable to slightly worse from prior    Chest 2 view 08/18/2022: COMPARISON:  Radiograph 10/29/2021   FINDINGS: Tracheostomy tube overlies trachea. Unchanged mildly enlarged cardiac silhouette. No focal airspace consolidation. No pleural effusion or pneumothorax. No acute osseous abnormality. Thoracic spondylosis. Bilateral shoulder degenerative changes.   IMPRESSION: No evidence of acute cardiopulmonary disease.  CTA chest abdomen pelvis 08/16/2022: IMPRESSION: No evidence of aortic dissection or aneurysmal dilatation.   No evidence of pulmonary emboli.   No acute abnormality to correspond with the given clinical history.   Chronic changes as described above.  Echo 06/04/17: Study Conclusions - Left ventricle: The cavity size was normal. Wall thickness was   normal. Systolic function was normal. The estimated ejection   fraction was in the range of 60% to 65%. Wall motion was normal;   there were no regional wall motion abnormalities. Doppler   parameters are consistent with abnormal left ventricular   relaxation (grade 1 diastolic dysfunction). - Mitral valve: Valve area by pressure half-time: 0.72 cm^2.   )         Anesthesia Quick Evaluation

## 2022-11-27 ENCOUNTER — Ambulatory Visit (HOSPITAL_COMMUNITY): Admission: RE | Admit: 2022-11-27 | Payer: 59 | Source: Home / Self Care | Admitting: Oral Surgery

## 2022-11-27 HISTORY — DX: Anemia, unspecified: D64.9

## 2022-11-27 HISTORY — DX: Unspecified osteoarthritis, unspecified site: M19.90

## 2022-11-27 SURGERY — DENTAL RESTORATION/EXTRACTIONS
Anesthesia: General

## 2022-11-28 ENCOUNTER — Telehealth: Payer: Self-pay | Admitting: Licensed Clinical Social Worker

## 2022-11-28 NOTE — Patient Instructions (Signed)
Visit Information  Thank you for taking time to visit with me today. Please don't hesitate to contact me if I can be of assistance to you.   Following are the goals we discussed today:   Goals Addressed             This Visit's Progress    Obtain Supportive Resources/Management of MH conditions   On track    Care Coordination Interventions: Solution-Focused Strategies employed:  Active listening / Reflection utilized  Emotional Support Provided Verbalization of feelings encouraged  LCSW informed patient of care coordination services. Pt is interested at this time and agreed to schedule initial appt with LCSW         Our next appointment is by telephone on 04/5 at 3 PM  Please call the care guide team at [phone] if you need to cancel or reschedule your appointment.   If you are experiencing a Mental Health or [place] or need someone to talk to, please call the Suicide and Crisis Lifeline: 988 call 911   Patient verbalizes understanding of instructions and care plan provided today and agrees to view in [place]. Active MyChart status and patient understanding of how to access instructions and care plan via MyChart confirmed with patient.     Christa See, MSW, [email or web address] Phone [phone] 4:53 PM

## 2022-11-28 NOTE — Patient Outreach (Signed)
  Care Coordination   Follow Up Visit Note   11/28/2022 Name: [name] MRN: [id number] DOB: 31-May-1971  [name] is a 52 y.o. year old female who sees [name], MD for primary care. I spoke with  [name] and daughter by phone today.  What matters to the patients health and wellness today?  Symptom Management    Goals Addressed             This Visit's Progress    Obtain Supportive Resources/Management of MH conditions   On track    Care Coordination Interventions: Solution-Focused Strategies employed:  Active listening / Reflection utilized  Emotional Support Provided Verbalization of feelings encouraged  LCSW informed patient of care coordination services. Pt is interested at this time and agreed to schedule initial appt with LCSW         SDOH assessments and interventions completed:  No     Care Coordination Interventions:  Yes, provided  Interventions Today    Flowsheet Row Most Recent Value  Chronic Disease   Chronic disease during today's visit Hypertension (HTN), Diabetes, Other  [Tracheostomy dependent, Chronic Pain Syndrome, GAD, MDD]  General Interventions   General Interventions Discussed/Reviewed General Interventions Reviewed, Doctor Visits  [Reviewed upcoming appts]  Doctor Visits Discussed/Reviewed Specialist  [Pt's oral surgery was cancelled. Pt agreed to f/up with specialist to discuss tx plan]  Mental Health Interventions   Mental Health Discussed/Reviewed Mental Health Reviewed, Coping Strategies, Depression, Anxiety       Follow up plan: Follow up call scheduled for 2-4 weeks    Encounter Outcome:  Pt. Visit Completed   Christa See, MSW, [email or web address] Phone [phone] 4:52 PM

## 2022-12-01 DIAGNOSIS — J0410 Acute tracheitis without obstruction: Secondary | ICD-10-CM | POA: Diagnosis not present

## 2022-12-01 DIAGNOSIS — R490 Dysphonia: Secondary | ICD-10-CM | POA: Diagnosis not present

## 2022-12-01 DIAGNOSIS — Z930 Tracheostomy status: Secondary | ICD-10-CM | POA: Diagnosis not present

## 2022-12-01 DIAGNOSIS — Z4689 Encounter for fitting and adjustment of other specified devices: Secondary | ICD-10-CM | POA: Diagnosis not present

## 2022-12-03 DIAGNOSIS — R030 Elevated blood-pressure reading, without diagnosis of hypertension: Secondary | ICD-10-CM | POA: Diagnosis not present

## 2022-12-03 DIAGNOSIS — M5416 Radiculopathy, lumbar region: Secondary | ICD-10-CM | POA: Diagnosis not present

## 2022-12-03 DIAGNOSIS — Z79899 Other long term (current) drug therapy: Secondary | ICD-10-CM | POA: Diagnosis not present

## 2022-12-05 DIAGNOSIS — J449 Chronic obstructive pulmonary disease, unspecified: Secondary | ICD-10-CM | POA: Diagnosis not present

## 2022-12-05 DIAGNOSIS — G4733 Obstructive sleep apnea (adult) (pediatric): Secondary | ICD-10-CM | POA: Diagnosis not present

## 2022-12-05 DIAGNOSIS — J9610 Chronic respiratory failure, unspecified whether with hypoxia or hypercapnia: Secondary | ICD-10-CM | POA: Diagnosis not present

## 2022-12-05 DIAGNOSIS — J45909 Unspecified asthma, uncomplicated: Secondary | ICD-10-CM | POA: Diagnosis not present

## 2022-12-06 ENCOUNTER — Other Ambulatory Visit: Payer: Self-pay | Admitting: Family Medicine

## 2022-12-06 DIAGNOSIS — E119 Type 2 diabetes mellitus without complications: Secondary | ICD-10-CM

## 2022-12-08 ENCOUNTER — Encounter: Payer: Self-pay | Admitting: Family Medicine

## 2022-12-08 ENCOUNTER — Other Ambulatory Visit: Payer: Self-pay | Admitting: Family Medicine

## 2022-12-08 DIAGNOSIS — J45909 Unspecified asthma, uncomplicated: Secondary | ICD-10-CM | POA: Diagnosis not present

## 2022-12-08 DIAGNOSIS — Z930 Tracheostomy status: Secondary | ICD-10-CM | POA: Diagnosis not present

## 2022-12-08 DIAGNOSIS — G43119 Migraine with aura, intractable, without status migrainosus: Secondary | ICD-10-CM

## 2022-12-08 DIAGNOSIS — J449 Chronic obstructive pulmonary disease, unspecified: Secondary | ICD-10-CM | POA: Diagnosis not present

## 2022-12-08 DIAGNOSIS — G4733 Obstructive sleep apnea (adult) (pediatric): Secondary | ICD-10-CM | POA: Diagnosis not present

## 2022-12-08 MED ORDER — INSULIN LISPRO (1 UNIT DIAL) 100 UNIT/ML (KWIKPEN): 10.0000 [IU] | PEN_INJECTOR | Freq: Three times a day (TID) | SUBCUTANEOUS | 6 refills | Status: DC

## 2022-12-09 ENCOUNTER — Other Ambulatory Visit: Payer: Self-pay | Admitting: Family Medicine

## 2022-12-09 DIAGNOSIS — Z79899 Other long term (current) drug therapy: Secondary | ICD-10-CM | POA: Diagnosis not present

## 2022-12-09 DIAGNOSIS — R112 Nausea with vomiting, unspecified: Secondary | ICD-10-CM

## 2022-12-11 ENCOUNTER — Other Ambulatory Visit: Payer: Self-pay | Admitting: Family Medicine

## 2022-12-11 MED ORDER — FUROSEMIDE 20 MG PO TABS: 20.0000 mg | ORAL_TABLET | Freq: Every day | ORAL | 3 refills | Status: DC

## 2022-12-13 ENCOUNTER — Other Ambulatory Visit: Payer: Self-pay | Admitting: Family Medicine

## 2022-12-13 DIAGNOSIS — B3731 Acute candidiasis of vulva and vagina: Secondary | ICD-10-CM

## 2022-12-15 NOTE — Telephone Encounter (Signed)
Requested medication (s) are due for refill today: yes  Requested medication (s) are on the active medication list: yes  Last refill:  12/23/21  Future visit scheduled: yes  Notes to clinic:   Medication not assigned to a protocol, review manually.  Medication was given for one time dose, should patient continue to take. Routing for review.     Requested Prescriptions  Pending Prescriptions Disp Refills   fluconazole (DIFLUCAN) 150 MG tablet [Pharmacy Med Name: Fluconazole 150 MG Oral Tablet] 1 tablet 0    Sig: TAKE ONE TABLET BY MOUTH AS A ONE-TIME DOSE     Off-Protocol Failed - 12/13/2022  6:44 AM      Failed - Medication not assigned to a protocol, review manually.      Passed - Valid encounter within last 12 months    Recent Outpatient Visits           2 months ago Type 1 diabetes mellitus with other specified complication [facility])   [place], [place], MD   5 months ago Type 1 diabetes mellitus with other specified complication [facility])   [place], [place], MD   8 months ago Type 1 diabetes mellitus with other specified complication [facility])   [place] [name], MD   11 months ago Encounter for [employer] annual wellness exam   [place], [place], MD   1 year ago Hypertension associated with diabetes [facility])   [place] [name], MD       Future Appointments             In 1 month [name], MD [place]

## 2022-12-25 ENCOUNTER — Other Ambulatory Visit: Payer: Self-pay | Admitting: Family Medicine

## 2022-12-25 DIAGNOSIS — R112 Nausea with vomiting, unspecified: Secondary | ICD-10-CM

## 2022-12-25 NOTE — Telephone Encounter (Signed)
Requested medication (s) are due for refill today:   Provider to review both  Requested medication (s) are on the active medication list:   Yes for both  Future visit scheduled:   Yes   Last ordered: Zanaflex 10/13/2022 #60, 2 refills;   Zofran 11/03/2022 #30, 0 refills  Returned because they are both non delegated refills   Requested Prescriptions  Pending Prescriptions Disp Refills   tiZANidine (ZANAFLEX) 4 MG tablet [Pharmacy Med Name: tiZANidine HCl 4 MG Oral Tablet] 60 tablet 0    Sig: TAKE 1 TABLET BY MOUTH EVERY 8 HOURS AS NEEDED FOR MUSCLE SPASM     Not Delegated - Cardiovascular:  Alpha-2 Agonists - tizanidine Failed - 12/25/2022  9:43 AM      Failed - This refill cannot be delegated      Passed - Valid encounter within last 6 months    Recent Outpatient Visits           2 months ago Type 1 diabetes mellitus with other specified complication ([place])   [place], [place], MD   5 months ago Type 1 diabetes mellitus with other specified complication [facility])   [place], [place], MD   8 months ago Type 1 diabetes mellitus with other specified complication [facility])   [place], Charlane Ferretti, MD   1 year ago Encounter for [employer] annual wellness exam   [place], [place]ong, MD   1 year ago Hypertension associated with diabetes [facility])   [place], Charlane Ferretti, MD       Future Appointments             In 3 weeks [name], MD [place]             ondansetron (ZOFRAN) 4 MG tablet [Pharmacy Med Name: Ondansetron HCl 4 MG Oral Tablet] 30 tablet 0    Sig: TAKE 1 TABLET BY MOUTH EVERY 8 HOURS AS NEEDED FOR NAUSEA FOR VOMITING     Not Delegated - Gastroenterology: Antiemetics - ondansetron Failed - 12/25/2022  9:43 AM      Failed -  This refill cannot be delegated      Passed - AST in normal range and within 360 days    AST  Date Value Ref Range Status  10/09/2022 13 0 - 40 IU/L Final         Passed - ALT in normal range and within 360 days    ALT  Date Value Ref Range Status  10/09/2022 15 0 - 32 IU/L Final         Passed - Valid encounter within last 6 months    Recent Outpatient Visits           2 months ago Type 1 diabetes mellitus with other specified complication [facility])   [place], [place], MD   5 months ago Type 1 diabetes mellitus with other specified complication [facility])   [place], [place], MD   8 months ago Type 1 diabetes mellitus with other specified complication [facility])   [place], Charlane Ferretti, MD   1 year ago Encounter for [employer] annual wellness exam   [place] [name], MD   1  year ago Hypertension associated with diabetes [facility])   [place], MD       Future Appointments             In 3 weeks [name], MD [place]

## 2022-12-26 ENCOUNTER — Ambulatory Visit: Payer: Self-pay | Admitting: Licensed Clinical Social Worker

## 2022-12-26 ENCOUNTER — Other Ambulatory Visit: Payer: Self-pay | Admitting: Family Medicine

## 2022-12-26 DIAGNOSIS — J4540 Moderate persistent asthma, uncomplicated: Secondary | ICD-10-CM

## 2022-12-26 MED ORDER — ALBUTEROL SULFATE (2.5 MG/3ML) 0.083% IN NEBU: 2.5000 mg | INHALATION_SOLUTION | Freq: Four times a day (QID) | RESPIRATORY_TRACT | 12 refills | Status: DC | PRN

## 2022-12-29 NOTE — Patient Outreach (Signed)
  Care Coordination   Follow Up Visit Note   12/26/22 Name: [name] MRN: [id number] DOB: 1970-11-02  [name] is a 52 y.o. year old female who sees Hoy Register, MD for primary care. I spoke with  [name] by phone today.  What matters to the patients health and wellness today?  Grief/Symptom Management/Resources    Goals Addressed             This Visit's Progress    Obtain Supportive Resources/Management of MH conditions   On track    Activities and task to complete in order to accomplish goals.   Keep all upcoming appointments discussed today Continue with compliance of taking medication prescribed by Doctor Implement healthy coping skills discussed to assist with management of symptoms          SDOH assessments and interventions completed:  No     Care Coordination Interventions:  Yes, provided   Follow up plan: Follow up call scheduled for 2-4 weeks    Encounter Outcome:  Pt. Visit Completed   Jenel Lucks, MSW, LCSW [facility] Care Management [facility]  [facility] [email or web address] Phone [phone] 1:45 PM

## 2022-12-29 NOTE — Patient Instructions (Signed)
Visit Information  Thank you for taking time to visit with me today. Please don't hesitate to contact me if I can be of assistance to you.   Following are the goals we discussed today:   Goals Addressed             This Visit's Progress    Obtain Supportive Resources/Management of MH conditions   On track    Activities and task to complete in order to accomplish goals.   Keep all upcoming appointments discussed today Continue with compliance of taking medication prescribed by Doctor Implement healthy coping skills discussed to assist with management of symptoms          Our next appointment is by telephone on 4/19 at 3 PM  Please call the care guide team at [phone] if you need to cancel or reschedule your appointment.   If you are experiencing a Mental Health or Behavioral Health Crisis or need someone to talk to, please call the Suicide and Crisis Lifeline: 988 call 911   Patient verbalizes understanding of instructions and care plan provided today and agrees to view in MyChart. Active MyChart status and patient understanding of how to access instructions and care plan via MyChart confirmed with patient.     Jenel Lucks, MSW, LCSW [facility] Care Management [place]  [facility] [email or web address] Phone [phone] 1:46 PM

## 2023-01-01 DIAGNOSIS — R030 Elevated blood-pressure reading, without diagnosis of hypertension: Secondary | ICD-10-CM | POA: Diagnosis not present

## 2023-01-01 DIAGNOSIS — M5416 Radiculopathy, lumbar region: Secondary | ICD-10-CM | POA: Diagnosis not present

## 2023-01-01 DIAGNOSIS — Z79899 Other long term (current) drug therapy: Secondary | ICD-10-CM | POA: Diagnosis not present

## 2023-01-05 DIAGNOSIS — Z794 Long term (current) use of insulin: Secondary | ICD-10-CM | POA: Diagnosis not present

## 2023-01-05 DIAGNOSIS — Z930 Tracheostomy status: Secondary | ICD-10-CM | POA: Diagnosis not present

## 2023-01-05 DIAGNOSIS — J386 Stenosis of larynx: Secondary | ICD-10-CM | POA: Diagnosis not present

## 2023-01-05 DIAGNOSIS — J449 Chronic obstructive pulmonary disease, unspecified: Secondary | ICD-10-CM | POA: Diagnosis not present

## 2023-01-05 DIAGNOSIS — E119 Type 2 diabetes mellitus without complications: Secondary | ICD-10-CM | POA: Diagnosis not present

## 2023-01-05 DIAGNOSIS — G4733 Obstructive sleep apnea (adult) (pediatric): Secondary | ICD-10-CM | POA: Diagnosis not present

## 2023-01-05 DIAGNOSIS — J45909 Unspecified asthma, uncomplicated: Secondary | ICD-10-CM | POA: Diagnosis not present

## 2023-01-05 DIAGNOSIS — R490 Dysphonia: Secondary | ICD-10-CM | POA: Diagnosis not present

## 2023-01-05 DIAGNOSIS — J9610 Chronic respiratory failure, unspecified whether with hypoxia or hypercapnia: Secondary | ICD-10-CM | POA: Diagnosis not present

## 2023-01-08 ENCOUNTER — Other Ambulatory Visit: Payer: Self-pay | Admitting: Family Medicine

## 2023-01-08 ENCOUNTER — Encounter: Payer: Self-pay | Admitting: Family Medicine

## 2023-01-08 DIAGNOSIS — E119 Type 2 diabetes mellitus without complications: Secondary | ICD-10-CM

## 2023-01-09 ENCOUNTER — Other Ambulatory Visit: Payer: Self-pay | Admitting: Family Medicine

## 2023-01-09 ENCOUNTER — Telehealth: Payer: Self-pay | Admitting: Licensed Clinical Social Worker

## 2023-01-09 ENCOUNTER — Encounter: Payer: Self-pay | Admitting: Licensed Clinical Social Worker

## 2023-01-09 ENCOUNTER — Telehealth: Payer: Self-pay | Admitting: Pharmacist

## 2023-01-09 DIAGNOSIS — E109 Type 1 diabetes mellitus without complications: Secondary | ICD-10-CM

## 2023-01-09 MED ORDER — ONETOUCH VERIO FLEX SYSTEM W/DEVICE KIT: PACK | 0 refills | Status: DC

## 2023-01-09 MED ORDER — ONETOUCH VERIO VI STRP: ORAL_STRIP | 6 refills | Status: DC

## 2023-01-09 MED ORDER — ONETOUCH DELICA LANCETS 33G MISC: 3 refills | Status: DC

## 2023-01-09 MED ORDER — ONETOUCH DELICA LANCETS 33G MISC: 1.0000 | Freq: Every day | 2 refills | Status: DC

## 2023-01-09 MED ORDER — ONETOUCH VERIO VI STRP: ORAL_STRIP | 2 refills | Status: DC

## 2023-01-09 MED ORDER — ONETOUCH VERIO W/DEVICE KIT: 1.0000 | PACK | Freq: Every day | 0 refills | Status: DC

## 2023-01-09 NOTE — Telephone Encounter (Signed)
Copied from CRM [id number]. Topic: General - Other >> Jan 09, 2023  9:09 AM Franchot Heidelberg wrote: Reason for CRM: [facility] pharmacy called to report that one touch verio is no longer available, they need a Rx for either One touch Verio Reflex or Flex. Please advise  Anyone can assist.

## 2023-01-09 NOTE — Telephone Encounter (Signed)
Rxns sent.

## 2023-01-09 NOTE — Patient Outreach (Signed)
  Care Coordination   01/09/2023 Name: [name] MRN: [id number] DOB: 18-Feb-1971   Care Coordination Outreach Attempts:  An unsuccessful telephone outreach was attempted for a scheduled appointment today.  Follow Up Plan:  Additional outreach attempts will be made to offer the patient care coordination information and services.   Encounter Outcome:  No Answer   Care Coordination Interventions:  No, not indicated    Jenel Lucks, MSW, LCSW [facility] Care Management [place]  [facility] [email or web address] Phone [phone] 3:07 PM

## 2023-01-09 NOTE — Addendum Note (Signed)
Addended by: Lois Huxley, Jeannett Senior L on: 01/09/2023 12:21 PM   Modules accepted: Orders

## 2023-01-15 ENCOUNTER — Ambulatory Visit: Payer: 59 | Attending: Family Medicine | Admitting: Family Medicine

## 2023-01-15 ENCOUNTER — Encounter: Payer: Self-pay | Admitting: Family Medicine

## 2023-01-15 VITALS — BP 148/94 | HR 97 | Ht 61.0 in | Wt 269.0 lb

## 2023-01-15 DIAGNOSIS — J4550 Severe persistent asthma, uncomplicated: Secondary | ICD-10-CM

## 2023-01-15 DIAGNOSIS — M51369 Other intervertebral disc degeneration, lumbar region without mention of lumbar back pain or lower extremity pain: Secondary | ICD-10-CM

## 2023-01-15 DIAGNOSIS — E1069 Type 1 diabetes mellitus with other specified complication: Secondary | ICD-10-CM

## 2023-01-15 DIAGNOSIS — Z794 Long term (current) use of insulin: Secondary | ICD-10-CM

## 2023-01-15 DIAGNOSIS — I5032 Chronic diastolic (congestive) heart failure: Secondary | ICD-10-CM | POA: Diagnosis not present

## 2023-01-15 DIAGNOSIS — F331 Major depressive disorder, recurrent, moderate: Secondary | ICD-10-CM | POA: Insufficient documentation

## 2023-01-15 DIAGNOSIS — F330 Major depressive disorder, recurrent, mild: Secondary | ICD-10-CM

## 2023-01-15 DIAGNOSIS — I152 Hypertension secondary to endocrine disorders: Secondary | ICD-10-CM | POA: Diagnosis not present

## 2023-01-15 DIAGNOSIS — Z7985 Long-term (current) use of injectable non-insulin antidiabetic drugs: Secondary | ICD-10-CM

## 2023-01-15 DIAGNOSIS — Z6841 Body Mass Index (BMI) 40.0 and over, adult: Secondary | ICD-10-CM

## 2023-01-15 DIAGNOSIS — E109 Type 1 diabetes mellitus without complications: Secondary | ICD-10-CM

## 2023-01-15 DIAGNOSIS — K219 Gastro-esophageal reflux disease without esophagitis: Secondary | ICD-10-CM | POA: Diagnosis not present

## 2023-01-15 DIAGNOSIS — E6609 Other obesity due to excess calories: Secondary | ICD-10-CM

## 2023-01-15 DIAGNOSIS — M5136 Other intervertebral disc degeneration, lumbar region: Secondary | ICD-10-CM | POA: Diagnosis not present

## 2023-01-15 DIAGNOSIS — Z1231 Encounter for screening mammogram for malignant neoplasm of breast: Secondary | ICD-10-CM

## 2023-01-15 LAB — POCT GLYCOSYLATED HEMOGLOBIN (HGB A1C): HbA1c, POC (controlled diabetic range): 11.2 % — AB (ref 0.0–7.0)

## 2023-01-15 LAB — GLUCOSE, POCT (MANUAL RESULT ENTRY): POC Glucose: 275 mg/dl — AB (ref 70–99)

## 2023-01-15 MED ORDER — SERTRALINE HCL 100 MG PO TABS: 100.0000 mg | ORAL_TABLET | Freq: Every day | ORAL | 1 refills | Status: DC

## 2023-01-15 MED ORDER — LISINOPRIL 20 MG PO TABS: 20.0000 mg | ORAL_TABLET | Freq: Every day | ORAL | 1 refills | Status: DC

## 2023-01-15 MED ORDER — TIRZEPATIDE 5 MG/0.5ML ~~LOC~~ SOAJ: 5.0000 mg | SUBCUTANEOUS | 0 refills | Status: DC

## 2023-01-15 MED ORDER — TIRZEPATIDE 10 MG/0.5ML ~~LOC~~ SOAJ: 10.0000 mg | SUBCUTANEOUS | 6 refills | Status: DC

## 2023-01-15 MED ORDER — AMLODIPINE BESYLATE 10 MG PO TABS: 10.0000 mg | ORAL_TABLET | Freq: Every day | ORAL | 1 refills | Status: DC

## 2023-01-15 MED ORDER — MONTELUKAST SODIUM 10 MG PO TABS: 10.0000 mg | ORAL_TABLET | Freq: Every day | ORAL | 1 refills | Status: DC

## 2023-01-15 MED ORDER — PANTOPRAZOLE SODIUM 40 MG PO TBEC: 40.0000 mg | DELAYED_RELEASE_TABLET | Freq: Two times a day (BID) | ORAL | 1 refills | Status: DC

## 2023-01-15 MED ORDER — FUROSEMIDE 20 MG PO TABS: 20.0000 mg | ORAL_TABLET | Freq: Every day | ORAL | 1 refills | Status: DC

## 2023-01-15 MED ORDER — HYDROXYZINE HCL 25 MG PO TABS: 25.0000 mg | ORAL_TABLET | Freq: Three times a day (TID) | ORAL | 1 refills | Status: DC | PRN

## 2023-01-15 MED ORDER — DAPAGLIFLOZIN PROPANEDIOL 10 MG PO TABS: 10.0000 mg | ORAL_TABLET | Freq: Every day | ORAL | 1 refills | Status: DC

## 2023-01-15 MED ORDER — TIRZEPATIDE 7.5 MG/0.5ML ~~LOC~~ SOAJ: 7.5000 mg | SUBCUTANEOUS | 0 refills | Status: DC

## 2023-01-15 MED ORDER — DULERA 200-5 MCG/ACT IN AERO: 2.0000 | INHALATION_SPRAY | Freq: Two times a day (BID) | RESPIRATORY_TRACT | 6 refills | Status: DC

## 2023-01-15 MED ORDER — INSULIN LISPRO (1 UNIT DIAL) 100 UNIT/ML (KWIKPEN): 10.0000 [IU] | PEN_INJECTOR | Freq: Three times a day (TID) | SUBCUTANEOUS | 6 refills | Status: DC

## 2023-01-15 MED ORDER — BUPROPION HCL ER (SR) 150 MG PO TB12: 150.0000 mg | ORAL_TABLET | Freq: Two times a day (BID) | ORAL | 1 refills | Status: DC

## 2023-01-15 NOTE — Progress Notes (Signed)
Subjective:  Patient ID: [name], female    DOB: 08-10-1971  Age: 52 y.o. MRN: [id number]  CC: Diabetes   [name] is a 52 y.o. year old female with a history of type 1 diabetes mellitus (A1c 9.5), hypertension, obstructive sleep apnea, asthma, depression, s/p tracheostomy secondary to intubation related tracheal injury, subglottic stenosis (status post balloon dilatation and Kenalog injection of stenosis tracheostomy tube exchange by ENT, status post right rotator cuff surgery in 07/2019. Uses 2L oxygen at night, s/p R L4L5 transforaminal lumbar interbody fusion with rods, screws and cage, local bone graft, allogen bone graft, vivigen in 09/2021   Interval History:  Weeks ago she had laryngoscopy with dilatation of subglottic stenosis and states she is in the process of healing. She Complains of being tired as there are days when she is unable to move as she has back pain with difficulty standing or walking.  Epidural spinal injections have been ineffective.  She has an upcoming appointment with Dr August Saucer. She lost her brother, her brother-in-law and it has been tough for her.  She has gained 18 pounds in the last 4 months. Her nausea and vomiting is not as bad as when she was on Ozempic.  Ozempic had been discontinued even though she had been on it for obesity. A1c is 11.2 and she endorses adherence with her insulin regimen.  She is unable to exercise due to chronic pain. Was previously followed by endocrinology until her endocrinologist left his practice.  She feels her asthma is uncontrolled, her oxygen saturation drops sometimes and she has to use her nebulizer a lot.  She is currently on Dulera, Singulair and her MDI. She is concerned that there is a diagnosis of heart failure on her chart.  Review of her echo from 2018 revealed EF of 60 to 65%, grade 1 DD. Past Medical History:  Diagnosis Date   Abnormal UGI series    Acute pain of right  shoulder due to trauma 10/05/2018   Acute respiratory failure with hypoxia and hypercapnia 10/23/2018   Allergy    Anemia    Anxiety    Arthritis    Asthma    Chest pain of uncertain etiology 03/22/2019   Chronic back pain    Chronic chest pain    resolved, no problems since 2019 per patient 07/27/19   Coffee ground vomiting 10/14/2021   COPD (chronic obstructive pulmonary disease)    inhaler   Depression    DM (diabetes mellitus)    INSULIN DEPENDENT - TYPE 1   Dyspnea    GERD (gastroesophageal reflux disease)    Headache(784.0)    History of hiatal hernia    Hyperlipidemia    no med, diet controlled   Hypertension    Hypokalemia    Klebsiella cystitis 03/14/2017   Last Assessment & Plan:   Formatting of this note might be different from the original.  - On Zosyn  - Urine culture: Klebsiella pneumoniae   Osteoarthritis    Pneumonia    Respiratory disease 05/2017   uses inhalers, neb tx prn, no oxygen   Rotator cuff tear 07/28/2019   Sleep apnea    does not use CPAP due to trach, uses 2L supplement oxygen at night   Status post tracheostomy 03/14/2017   Last Assessment & Plan:  Formatting of this note might be different from the original. - VERY CRITICAL AIRWAY WITH EXTREME PRECUATION - On tach - ENT following along (please see above for  current trach management plan)   Tracheostomy in place 02/2017    Past Surgical History:  Procedure Laterality Date   ABDOMINAL HYSTERECTOMY  2009   APPENDECTOMY     BIOPSY  10/15/2021   Procedure: BIOPSY;  Surgeon: Tressia Danas, MD;  Location: [facility] ENDOSCOPY;  Service: Gastroenterology;;   CESAREAN SECTION     x 3   CHOLECYSTECTOMY N/A 03/02/2014   Procedure: LAPAROSCOPIC CHOLECYSTECTOMY;  Surgeon: Clovis Pu. Cornett, MD;  Location: [facility] OR;  Service: General;  Laterality: N/A;   ESOPHAGOGASTRODUODENOSCOPY (EGD) WITH PROPOFOL N/A 10/15/2021   Procedure: ESOPHAGOGASTRODUODENOSCOPY (EGD) WITH PROPOFOL;  Surgeon: Tressia Danas, MD;   Location: [facility] ENDOSCOPY;  Service: Gastroenterology;  Laterality: N/A;   HERNIA REPAIR     PANENDOSCOPY N/A 03/04/2017   Procedure: PANENDOSCOPY WITH POSSIBLE FOREIGN BODY REMOVAL;  Surgeon: Flo Shanks, MD;  Location: [facility] OR;  Service: ENT;  Laterality: N/A;   ROTATOR CUFF REPAIR Left    ROTATOR CUFF REPAIR Right 07/27/2019   SHOULDER ARTHROSCOPY WITH SUBACROMIAL DECOMPRESSION, ROTATOR CUFF REPAIR AND BICEP TENDON REPAIR Right 07/28/2019   Procedure: RIGHT SHOULDER ARTHROSCOP, MINI OPEN ROTATOR CUFF TEAR REPAIR,  BICEPS TENODESIS, DISTAL CLAVICLE EXCISION;  Surgeon: Cammy Copa, MD;  Location: [facility] OR;  Service: Orthopedics;  Laterality: Right;   TOOTH EXTRACTION N/A 02/22/2021   Procedure: DENTAL RESTORATION/EXTRACTIONS;  Surgeon: Ocie Doyne, DMD;  Location: [facility] OR;  Service: Oral Surgery;  Laterality: N/A;   TRACHEOSTOMY  02/2017   TUBAL LIGATION     VESICOVAGINAL FISTULA CLOSURE W/ TAH  2009    Family History  Problem Relation Age of Onset   Heart attack Mother    Stroke Mother    Diabetes Mother    Hypertension Mother    Arthritis Mother    Stroke Father    Asthma Sister    Hypertension Sister    Asthma Sister    Diabetes Sister    Asthma Brother    Seizures Brother    Asthma Brother    Diabetes Brother    Asthma Daughter    Asthma Son    Asthma Grandson    Colon cancer Neg Hx    Rectal cancer Neg Hx    Stomach cancer Neg Hx    Esophageal cancer Neg Hx    Ovarian cancer Neg Hx    Pancreatic cancer Neg Hx     Social History   Socioeconomic History   Marital status: Married    Spouse name: Hydrologist   Number of children: Not on file   Years of education: Not on file   Highest education level: Not on file  Occupational History   Not on file  Tobacco Use   Smoking status: Former    Packs/day: 0.50    Years: 25.00    Additional pack years: 0.00    Total pack years: 12.50    Types: Cigarettes    Quit date: 01/21/2016    Years since quitting: 6.9    Smokeless tobacco: Never  Vaping Use   Vaping Use: Never used  Substance and Sexual Activity   Alcohol use: Yes    Comment: social wine   Drug use: Not Currently    Types: Marijuana    Comment: couple times a month, Last use was on   Sexual activity: Yes    Partners: Male    Birth control/protection: Surgical    Comment: Hysterectomy  Other Topics Concern   Not on file  Social History Narrative   Lives with husband  Social Determinants of Health   Financial Resource Strain: Medium Risk (06/06/2021)   Overall Financial Resource Strain (CARDIA)    Difficulty of Paying Living Expenses: Somewhat hard  Food Insecurity: Food Insecurity Present (06/06/2021)   Hunger Vital Sign    Worried About Running Out of Food in the Last Year: Sometimes true    Ran Out of Food in the Last Year: Sometimes true  Transportation Needs: No Transportation Needs (06/06/2021)   PRAPARE - Administrator, Civil Service (Medical): No    Lack of Transportation (Non-Medical): No  Physical Activity: Inactive (06/06/2021)   Exercise Vital Sign    Days of Exercise per Week: 0 days    Minutes of Exercise per Session: 0 min  Stress: Stress Concern Present (06/06/2021)   [employer] of Occupational Health - Occupational Stress Questionnaire    Feeling of Stress : Very much  Social Connections: Moderately Isolated (06/06/2021)   Social Connection and Isolation Panel [NHANES]    Frequency of Communication with Friends and Family: More than three times a week    Frequency of Social Gatherings with Friends and Family: More than three times a week    Attends Religious Services: Never    Database administrator or Organizations: No    Attends Banker Meetings: Never    Marital Status: Married    Allergies  Allergen Reactions   Reglan [Metoclopramide] Other (See Comments)    Panic attack    Outpatient Medications Prior to Visit  Medication Sig Dispense Refill   albuterol (PROVENTIL)  (2.5 MG/3ML) 0.083% nebulizer solution Take 3 mLs (2.5 mg total) by nebulization every 6 (six) hours as needed for wheezing or shortness of breath. 75 mL 12   albuterol (VENTOLIN HFA) 108 (90 Base) MCG/ACT inhaler INHALE 2 PUFFS INTO THE LUNGS EVERY 6 HOURS AS NEEDED FOR WHEEZING OR SHORTNESS OF BREATH 18 g 1   amitriptyline (ELAVIL) 10 MG tablet TAKE 1 TABLET BY MOUTH AT BEDTIME (DISCONTINUE  TOPAMAX) 90 tablet 0   amoxicillin-clavulanate (AUGMENTIN) 875-125 MG tablet Take 1 tablet by mouth every 12 (twelve) hours.     benzonatate (TESSALON) 200 MG capsule Take 1 capsule (200 mg total) by mouth 2 (two) times daily as needed for cough. 20 capsule 0   Blood Glucose Monitoring Suppl (ONETOUCH VERIO FLEX SYSTEM) w/Device KIT Use to check glucose levels daily; E11.9 1 kit 0   cetirizine (ZYRTEC) 10 MG tablet TAKE 1 TABLET BY MOUTH AT BEDTIME (Patient taking differently: Take 10 mg by mouth at bedtime.) 30 tablet 0   chlorpheniramine-HYDROcodone (TUSSIONEX) 10-8 MG/5ML Take 5 mLs by mouth 2 (two) times daily. 473 mL 0   Continuous Blood Gluc Receiver (FREESTYLE LIBRE 2 READER) DEVI USE TO CHECK BLOOD SUGAR THREE TIMES DAILY. 1 each 0   Continuous Blood Gluc Sensor (FREESTYLE LIBRE 2 SENSOR) MISC USE TO CHECK BLOOD SUGAR THREE TIMES DAILY. CHANGE SENSOR ONCE EVERY 14 DAYS 2 each 2   diclofenac Sodium (VOLTAREN) 1 % GEL Apply 4 g topically 4 (four) times daily. 100 g 1   fluconazole (DIFLUCAN) 150 MG tablet TAKE ONE TABLET BY MOUTH AS A ONE-TIME DOSE 1 tablet 0   glucose blood (ONETOUCH VERIO) test strip Use to check glucose levels daily; E11.9 100 each 6   insulin degludec (TRESIBA FLEXTOUCH) 200 UNIT/ML FlexTouch Pen Inject 170 Units into the skin daily. 54 mL 3   Insulin Pen Needle 31G X 8 MM MISC Use to inject [place] and Humalog.  Pt may use 4 pen needles daily. 100 each 11   melatonin 3 MG TABS tablet Take 1 tablet (3 mg total) by mouth at bedtime. 30 tablet 3   Misc. Devices MISC Nebulizer device.  Diagnosis - Asthma 1 each 0   Misc. Devices MISC Shower chair 1 each 0   ondansetron (ZOFRAN) 4 MG tablet TAKE 1 TABLET BY MOUTH EVERY 8 HOURS AS NEEDED FOR NAUSEA FOR VOMITING 30 tablet 0   OneTouch Delica Lancets 33G MISC Use to check glucose levels daily; E11.9 100 each 3   oxyCODONE-acetaminophen (PERCOCET) 10-325 MG tablet Take 1 tablet by mouth 5 (five) times daily as needed.     pregabalin (LYRICA) 50 MG capsule Take 1 capsule (50 mg total) by mouth in the morning, at noon, and at bedtime. 90 capsule 3   rosuvastatin (CRESTOR) 20 MG tablet Take 1 tablet (20 mg total) by mouth daily. 90 tablet 1   tiZANidine (ZANAFLEX) 4 MG tablet TAKE 1 TABLET BY MOUTH EVERY 8 HOURS AS NEEDED FOR MUSCLE SPASM 60 tablet 0   Vitamin D, Ergocalciferol, (DRISDOL) 1.25 MG (50000 UNIT) CAPS capsule Take 1 capsule (50,000 Units total) by mouth every 7 (seven) days. 5 capsule 0   amLODipine (NORVASC) 10 MG tablet Take 1 tablet (10 mg total) by mouth daily. 90 tablet 1   buPROPion (WELLBUTRIN SR) 150 MG 12 hr tablet Take 1 tablet (150 mg total) by mouth 2 (two) times daily. 180 tablet 1   dapagliflozin propanediol (FARXIGA) 10 MG TABS tablet Take 1 tablet (10 mg total) by mouth daily before breakfast. 90 tablet 1   furosemide (LASIX) 20 MG tablet Take 1 tablet (20 mg total) by mouth daily. 30 tablet 3   hydrOXYzine (ATARAX) 25 MG tablet Take 1 tablet (25 mg total) by mouth 3 (three) times daily as needed. for anxiety 180 tablet 1   insulin lispro (HUMALOG KWIKPEN) 100 UNIT/ML KwikPen Inject 10 Units into the skin 3 (three) times daily. 15 mL 6   lisinopril (ZESTRIL) 20 MG tablet Take 1 tablet by mouth once daily 90 tablet 0   mometasone-formoterol (DULERA) 200-5 MCG/ACT AERO Inhale 2 puffs into the lungs in the morning and at bedtime. 1 each 6   montelukast (SINGULAIR) 10 MG tablet TAKE 1 TABLET BY MOUTH AT BEDTIME 90 tablet 0   pantoprazole (PROTONIX) 40 MG tablet Take 1 tablet (40 mg total) by mouth 2 (two) times  daily. 180 tablet 1   sertraline (ZOLOFT) 100 MG tablet Take 1 tablet (100 mg total) by mouth at bedtime. 90 tablet 1   No facility-administered medications prior to visit.     ROS Review of Systems  Constitutional:  Negative for activity change and appetite change.  HENT:  Negative for sinus pressure and sore throat.   Respiratory:  Positive for cough and wheezing. Negative for chest tightness and shortness of breath.   Cardiovascular:  Negative for chest pain and palpitations.  Gastrointestinal:  Negative for abdominal distention, abdominal pain and constipation.  Genitourinary: Negative.   Musculoskeletal:  Positive for back pain.  Psychiatric/Behavioral:  Positive for dysphoric mood. Negative for behavioral problems.     Objective:  BP (!) 148/94   Pulse 97   Ht 5\' 1"  (1.549 m)   Wt 269 lb (122 kg)   LMP  (LMP Unknown)   SpO2 100%   BMI 50.83 kg/m      01/15/2023   11:32 AM 11/25/2022    3:38 PM 10/09/2022  9:42 AM  BP/Weight  Systolic BP 148  098  Diastolic BP 94  91  Wt. (Lbs) 269 258.4 244.6  BMI 50.83 kg/m2 48.82 kg/m2 46.22 kg/m2    Wt Readings from Last 3 Encounters:  01/15/23 269 lb (122 kg)  10/09/22 244 lb 9.6 oz (110.9 kg)  09/10/22 247 lb (112 kg)     Physical Exam Constitutional:      Appearance: She is well-developed. She is obese.  Neck:     Comments: Tracheostomy tube in place Cardiovascular:     Rate and Rhythm: Normal rate.     Heart sounds: Normal heart sounds. No murmur heard. Pulmonary:     Effort: Pulmonary effort is normal.     Breath sounds: Normal breath sounds. No wheezing or rales.  Chest:     Chest wall: No tenderness.  Abdominal:     General: Bowel sounds are normal. There is no distension.     Palpations: Abdomen is soft. There is no mass.     Tenderness: There is no abdominal tenderness.  Musculoskeletal:        General: Tenderness (lumbar spine) present.     Right lower leg: No edema.     Left lower leg: No edema.   Neurological:     Mental Status: She is alert and oriented to person, place, and time.  Psychiatric:     Comments: Dysphoric mood        Latest Ref Rng & Units 10/09/2022   10:46 AM 08/19/2022    6:45 AM 08/18/2022    9:54 AM  CMP  Glucose 70 - 99 mg/dL 119  147  829   BUN 6 - 24 mg/dL 17  16  28    Creatinine 0.57 - 1.00 mg/dL 5.62  1.30  8.65   Sodium 134 - 144 mmol/L 141  139  138   Potassium 3.5 - 5.2 mmol/L 4.6  4.0  4.0   Chloride 96 - 106 mmol/L 102  106  107   CO2 20 - 29 mmol/L 22  25  23    Calcium 8.7 - 10.2 mg/dL 9.7  8.8  8.5   Total Protein 6.0 - 8.5 g/dL 7.5  7.6  7.0   Total Bilirubin 0.0 - 1.2 mg/dL 0.3  0.3  0.5   Alkaline Phos 44 - 121 IU/L 128  111  105   AST 0 - 40 IU/L 13  25  26    ALT 0 - 32 IU/L 15  27  26      Lipid Panel     Component Value Date/Time   CHOL 144 10/09/2022 1046   TRIG 124 10/09/2022 1046   HDL 54 10/09/2022 1046   CHOLHDL 3.2 06/15/2018 1031   CHOLHDL 3.9 02/22/2016 0954   VLDL 26 02/22/2016 0954   LDLCALC 68 10/09/2022 1046    CBC    Component Value Date/Time   WBC 7.5 08/19/2022 0645   RBC 4.15 08/19/2022 0645   HGB 11.8 (L) 08/19/2022 0645   HGB 11.9 04/12/2018 1035   HCT 37.2 08/19/2022 0645   HCT 34.8 04/12/2018 1035   PLT 380 08/19/2022 0645   PLT 373 04/12/2018 1035   MCV 89.6 08/19/2022 0645   MCV 84 04/12/2018 1035   MCH 28.4 08/19/2022 0645   MCHC 31.7 08/19/2022 0645   RDW 14.6 08/19/2022 0645   RDW 14.6 04/12/2018 1035   LYMPHSABS 4.4 (H) 08/18/2022 0954   LYMPHSABS 4.0 (H) 04/12/2018 1035   MONOABS 0.6 08/18/2022 7846  EOSABS 0.1 08/18/2022 0954   EOSABS 0.0 04/12/2018 1035   BASOSABS 0.0 08/18/2022 0954   BASOSABS 0.0 04/12/2018 1035    Lab Results  Component Value Date   HGBA1C 11.2 (A) 01/15/2023    Assessment & Plan:  1. Type 1 diabetes mellitus without complication Uncontrolled with A1c of 11.2 Goal is less than 7.0 She endorses adherence with her insulin regimen Will refer to  endocrinology She is wanting to be restarted on the GLP-1RA medications to help with her weight and would like to take this despite her nausea and vomiting. I have placed on Mounjaro for weight benefit per request and she will titrate this up as tolerated.  At the maximum tolerated dose.  She will take her antiemetic as well. Counseled on Diabetic diet, my plate method, 161 minutes of moderate intensity exercise/week Blood sugar logs with fasting goals of 80-120 mg/dl, random of less than 096 and in the event of sugars less than 60 mg/dl or greater than 045 mg/dl encouraged to notify the clinic. Advised on the need for annual eye exams, annual foot exams, Pneumonia vaccine. - POCT glucose (manual entry) - POCT glycosylated hemoglobin (Hb A1C) - Ambulatory referral to Endocrinology - tirzepatide [facility]) 5 MG/0.5ML Pen; Inject 5 mg into the skin once a week. For 4 weeks then increase to 7.5 mg once a week  Dispense: 6 mL; Refill: 0 - tirzepatide (MOUNJARO) 7.5 MG/0.5ML Pen; Inject 7.5 mg into the skin once a week. For 4 weeks then increase to 10 mg once a week  Dispense: 6 mL; Refill: 0 - tirzepatide (MOUNJARO) 10 MG/0.5ML Pen; Inject 10 mg into the skin once a week.  Dispense: 6 mL; Refill: 6 - insulin lispro (HUMALOG KWIKPEN) 100 UNIT/ML KwikPen; Inject 10 Units into the skin 3 (three) times daily.  Dispense: 15 mL; Refill: 6 - dapagliflozin propanediol (FARXIGA) 10 MG TABS tablet; Take 1 tablet (10 mg total) by mouth daily before breakfast.  Dispense: 90 tablet; Refill: 1   2. Chronic diastolic heart failure EF 60 to 65% from echo of 2018 She is on an SGLT2i She does have asthma which is not fully optimized and can overlap with cardiac symptoms but will refer to cardiology all the same. - Pro b natriuretic peptide - Ambulatory referral to Cardiology  3. Hypertension associated with diabetes Slightly above goal Continue lifestyle modifications Will adjust regimen at next visit if still  controlled Counseled on blood pressure goal of less than 130/80, low-sodium, DASH diet, medication compliance, 150 minutes of moderate intensity exercise per week. Discussed medication compliance, adverse effects. - amLODipine (NORVASC) 10 MG tablet; Take 1 tablet (10 mg total) by mouth daily.  Dispense: 90 tablet; Refill: 1 - lisinopril (ZESTRIL) 20 MG tablet; Take 1 tablet (20 mg total) by mouth daily.  Dispense: 90 tablet; Refill: 1  4. Mild episode of recurrent major depressive disorder Recent bereavement exacerbating her condition Will refer to LCSW for counseling Educated about grief counseling resources - buPROPion (WELLBUTRIN SR) 150 MG 12 hr tablet; Take 1 tablet (150 mg total) by mouth 2 (two) times daily.  Dispense: 180 tablet; Refill: 1 - hydrOXYzine (ATARAX) 25 MG tablet; Take 1 tablet (25 mg total) by mouth 3 (three) times daily as needed. for anxiety  Dispense: 180 tablet; Refill: 1 - sertraline (ZOLOFT) 100 MG tablet; Take 1 tablet (100 mg total) by mouth at bedtime.  Dispense: 90 tablet; Refill: 1  5. Gastroesophageal reflux disease without esophagitis Stable Has upcoming appointment with GI -  pantoprazole (PROTONIX) 40 MG tablet; Take 1 tablet (40 mg total) by mouth 2 (two) times daily.  Dispense: 180 tablet; Refill: 1  6. Severe persistent asthma without complication Fully optimized - Ambulatory referral to Pulmonology - mometasone-formoterol (DULERA) 200-5 MCG/ACT AERO; Inhale 2 puffs into the lungs in the morning and at bedtime.  Dispense: 1 each; Refill: 6 - montelukast (SINGULAIR) 10 MG tablet; Take 1 tablet (10 mg total) by mouth at bedtime.  Dispense: 90 tablet; Refill: 1  8. Encounter for screening mammogram for malignant neoplasm of breast - MM DIGITAL SCREENING BILATERAL; Future  9. Bulging lumbar disc Uncontrolled Status post epidural spinal injections Referred for aquatic therapy in the past but she never made it there Currently under the care of pain  management Follow-up with orthopedics Weight loss will be beneficial   Meds ordered this encounter  Medications   tirzepatide (MOUNJARO) 5 MG/0.5ML Pen    Sig: Inject 5 mg into the skin once a week. For 4 weeks then increase to 7.5 mg once a week    Dispense:  6 mL    Refill:  0   tirzepatide (MOUNJARO) 7.5 MG/0.5ML Pen    Sig: Inject 7.5 mg into the skin once a week. For 4 weeks then increase to 10 mg once a week    Dispense:  6 mL    Refill:  0   tirzepatide (MOUNJARO) 10 MG/0.5ML Pen    Sig: Inject 10 mg into the skin once a week.    Dispense:  6 mL    Refill:  6   amLODipine (NORVASC) 10 MG tablet    Sig: Take 1 tablet (10 mg total) by mouth daily.    Dispense:  90 tablet    Refill:  1    Dose increase   buPROPion (WELLBUTRIN SR) 150 MG 12 hr tablet    Sig: Take 1 tablet (150 mg total) by mouth 2 (two) times daily.    Dispense:  180 tablet    Refill:  1   dapagliflozin propanediol (FARXIGA) 10 MG TABS tablet    Sig: Take 1 tablet (10 mg total) by mouth daily before breakfast.    Dispense:  90 tablet    Refill:  1    Dose increase   furosemide (LASIX) 20 MG tablet    Sig: Take 1 tablet (20 mg total) by mouth daily.    Dispense:  90 tablet    Refill:  1   hydrOXYzine (ATARAX) 25 MG tablet    Sig: Take 1 tablet (25 mg total) by mouth 3 (three) times daily as needed. for anxiety    Dispense:  180 tablet    Refill:  1   insulin lispro (HUMALOG KWIKPEN) 100 UNIT/ML KwikPen    Sig: Inject 10 Units into the skin 3 (three) times daily.    Dispense:  15 mL    Refill:  6    Dose change   lisinopril (ZESTRIL) 20 MG tablet    Sig: Take 1 tablet (20 mg total) by mouth daily.    Dispense:  90 tablet    Refill:  1   mometasone-formoterol (DULERA) 200-5 MCG/ACT AERO    Sig: Inhale 2 puffs into the lungs in the morning and at bedtime.    Dispense:  1 each    Refill:  6   montelukast (SINGULAIR) 10 MG tablet    Sig: Take 1 tablet (10 mg total) by mouth at bedtime.     Dispense:  90 tablet  Refill:  1   pantoprazole (PROTONIX) 40 MG tablet    Sig: Take 1 tablet (40 mg total) by mouth 2 (two) times daily.    Dispense:  180 tablet    Refill:  1   sertraline (ZOLOFT) 100 MG tablet    Sig: Take 1 tablet (100 mg total) by mouth at bedtime.    Dispense:  90 tablet    Refill:  1   Visit required 51 minutes of patient care including median intraservice time, reviewing previous notes and test results, coordination of care, counseling the patient in addition to management of chronic medical conditions.Time also spent ordering medications, investigations and documenting in the chart.  All questions were answered to the patient's satisfaction  Follow-up: Return in about 3 months (around 04/16/2023) for Chronic medical conditions.       Hoy Register, MD, FAAFP. [facility] [place], [place] [phone]   01/15/2023, 12:31 PM

## 2023-01-15 NOTE — Patient Instructions (Signed)
Chronic Pain, Adult Chronic pain is a type of pain that lasts or keeps coming back for at least 3-6 months. You may have headaches, pain in the abdomen, or pain in other areas of the body. Chronic pain may be related to an illness, injury, or a health condition. Sometimes, the cause of chronic pain is not known. Chronic pain can make it hard for you to do daily activities. If it is not treated, chronic pain can lead to anxiety and depression. Treatment depends on the cause of your pain and how severe it is. You may need to work with a pain specialist to come up with a treatment plan. Many people benefit from two or more types of treatment to control their pain. Follow these instructions at home: Treatment plan Follow your treatment plan as told by your health care provider. This may include: Gentle, regular exercise. Eating a healthy diet that includes foods such as vegetables, fruits, fish, and lean meats. Mental health therapy (cognitive or behavioral therapy) that changes the way you think or act in response to the pain. This may help improve how you feel. Doing physical therapy exercises to improve movement and strength. Meditation, yoga, acupuncture, or massage therapy. Using the oils from plants in your environment or on your skin (aromatherapy). Other treatments may include: Over-the-counter or prescription medicines. Color, light, or sound therapy. Local electrical stimulation. The electrical pulses help to relieve pain by temporarily stopping the nerve impulses that cause you to feel pain. Injections. These deliver numbing or pain-relieving medicines into the spine or the area of pain.  Medicines Take over-the-counter and prescription medicines only as told by your health care provider. Ask your health care provider if the medicine prescribed to you: Requires you to avoid driving or using machinery. Can cause constipation. You may need to take these actions to prevent or treat  constipation: Drink enough fluid to keep your urine pale yellow. Take over-the-counter or prescription medicines. Eat foods that are high in fiber, such as beans, whole grains, and fresh fruits and vegetables. Limit foods that are high in fat and processed sugars, such as fried or sweet foods. Lifestyle  Ask your health care provider whether you should keep a pain diary. Your health care provider will tell you what information to write in the diary. This may include: When you have pain. What the pain feels like. How medicines and other behaviors or treatments help to reduce the pain. Consider talking with a mental health care provider about how to help manage chronic pain. Consider joining a chronic pain support group. Try to control or lower your stress levels. Talk with your health care provider about ways to do this. General instructions Learn as much as you can about how to manage your chronic pain. Ask your health care provider if an intensive pain rehabilitation program or a chronic pain specialist would be helpful. Check your pain level as told by your health care provider. Ask your health care provider if you should use a pain scale. Contact a health care provider if: Your pain is not controlled with treatment. You have new pain. You have side effects from pain medicine. You feel weak or you have trouble doing your normal activities. You have trouble sleeping or you develop confusion. You lose feeling or have numbness in your body. You lose control of your bowels or bladder. Get help right away if: Your pain suddenly gets much worse. You develop chest pain. You have trouble breathing or shortness of   breath. You faint, or another person sees you faint. These symptoms may be an emergency. Get help right away. Call 911. Do not wait to see if the symptoms will go away. Do not drive yourself to the hospital. Also, get help right away if: You have thoughts about hurting yourself  or others. Take one of these steps if you feel like you may hurt yourself or others, or have thoughts about taking your own life: Go to your nearest emergency room. Call 911. Call the National Suicide Prevention Lifeline at 1-800-273-8255 or 988. This is open 24 hours a day. Text the Crisis Text Line at 741741. This information is not intended to replace advice given to you by your health care provider. Make sure you discuss any questions you have with your health care provider. Document Revised: 04/30/2022 Document Reviewed: 04/02/2022 Elsevier Patient Education  2023 Elsevier Inc.  

## 2023-01-16 ENCOUNTER — Encounter: Payer: Self-pay | Admitting: Family Medicine

## 2023-01-16 LAB — PRO B NATRIURETIC PEPTIDE: NT-Pro BNP: <36 pg/mL (ref 0–249)

## 2023-01-19 ENCOUNTER — Other Ambulatory Visit: Payer: Self-pay | Admitting: Pharmacist

## 2023-01-19 ENCOUNTER — Other Ambulatory Visit: Payer: Self-pay

## 2023-01-19 ENCOUNTER — Other Ambulatory Visit: Payer: Self-pay | Admitting: Family Medicine

## 2023-01-19 ENCOUNTER — Other Ambulatory Visit: Payer: Self-pay | Admitting: Internal Medicine

## 2023-01-19 DIAGNOSIS — B3731 Acute candidiasis of vulva and vagina: Secondary | ICD-10-CM

## 2023-01-19 DIAGNOSIS — Z794 Long term (current) use of insulin: Secondary | ICD-10-CM

## 2023-01-19 DIAGNOSIS — E109 Type 1 diabetes mellitus without complications: Secondary | ICD-10-CM

## 2023-01-19 MED ORDER — TIRZEPATIDE 7.5 MG/0.5ML ~~LOC~~ SOAJ
7.5000 mg | SUBCUTANEOUS | 0 refills | Status: DC
  Filled 2023-01-19: qty 6, 84d supply, fill #0
  Filled 2023-02-06: qty 2, 28d supply, fill #0
  Filled 2023-03-16: qty 2, 28d supply, fill #1
  Filled 2023-04-06: qty 2, 28d supply, fill #2

## 2023-01-19 MED ORDER — TIRZEPATIDE 5 MG/0.5ML ~~LOC~~ SOAJ
5.0000 mg | SUBCUTANEOUS | 0 refills | Status: DC
  Filled 2023-01-19: qty 2, 28d supply, fill #0

## 2023-01-19 MED ORDER — TIRZEPATIDE 10 MG/0.5ML ~~LOC~~ SOAJ
10.0000 mg | SUBCUTANEOUS | 6 refills | Status: DC
  Filled 2023-01-19 – 2023-02-06 (×2): qty 6, 84d supply, fill #0

## 2023-01-21 NOTE — Progress Notes (Unsigned)
Cardiology Office Note:   Date:  01/22/2023  ID:  [name], DOB Oct 27, 1970, MRN [id number]  History of Present Illness:   [name] is a 52 y.o. female HTN, anxiety, COPD, depression, DMII, s/p trach secondary to intubation related trach injury, subglottic stenosis s/p balloon dilation who was referred by Dr. Sabra Heck for concern for diastolic HF.  Patient seen by Dr. Alvis Lemmings on 01/15/23 where she was concerned about diastolic HF that was noted in her chart. She was referred to Cardiology for further evaluation.  Today, the patient states that she is only doing okay. She has been having LE edema that has been worsening over the past couple of months. Has also been having intermittent episodes of chest pain mainly with stress as well as worsening SOB. She also reports that since her trach, she needs to sleep propped up on 5 pillows and needs nocturnal oxygen. Has intermittent lightheadedness but no recent syncope. Tolerating medications as prescribed.   Past Medical History:  Diagnosis Date   Abnormal UGI series    Acute pain of right shoulder due to trauma 10/05/2018   Acute respiratory failure with hypoxia and hypercapnia (HCC) 10/23/2018   Allergy    Anemia    Anxiety    Arthritis    Asthma    Chest pain of uncertain etiology 03/22/2019   Chronic back pain    Chronic chest pain    resolved, no problems since 2019 per patient 07/27/19   Coffee ground vomiting 10/14/2021   COPD (chronic obstructive pulmonary disease) (HCC)    inhaler   Depression    DM (diabetes mellitus) (HCC)    INSULIN DEPENDENT - TYPE 1   Dyspnea    GERD (gastroesophageal reflux disease)    Headache(784.0)    History of hiatal hernia    Hyperlipidemia    no med, diet controlled   Hypertension    Hypokalemia    Klebsiella cystitis 03/14/2017   Last Assessment & Plan:   Formatting of this note might be different from the original.  - On Zosyn  - Urine culture: Klebsiella  pneumoniae   Osteoarthritis    Pneumonia    Respiratory disease 05/2017   uses inhalers, neb tx prn, no oxygen   Rotator cuff tear 07/28/2019   Sleep apnea    does not use CPAP due to trach, uses 2L supplement oxygen at night   Status post tracheostomy (HCC) 03/14/2017   Last Assessment & Plan:  Formatting of this note might be different from the original. - VERY CRITICAL AIRWAY WITH EXTREME PRECUATION - On tach - ENT following along (please see above for current trach management plan)   Tracheostomy in place (HCC) 02/2017     ROS: As per HPI  Studies Reviewed:    EKG:  Sinus tachycardia with HR 102  Cardiac Studies & Procedures       ECHOCARDIOGRAM  ECHOCARDIOGRAM COMPLETE 06/04/2017  Narrative *[name]* *[facility]* [address] [place], [place] [address] [phone]  ------------------------------------------------------------------- Transthoracic Echocardiography  Patient:    Brittney, Bradley MR #:       [id number] Study Date: 06/04/2017 Gender:     F Age:        70 Height:     154.9 cm Weight:     105 kg BSA:        2.19 m^2 Pt. Status: Room:  Gala Murdoch REFERRING    Nelda Bucks ADMITTING  Ozella Rocks PERFORMING   [facility], Inpatient SONOGRAPHER  Thurman Coyer ATTENDING    Arrien, York Ram  cc:  ------------------------------------------------------------------- LV EF: 60% -   65%  ------------------------------------------------------------------- Indications:      Fever 780.6.  ------------------------------------------------------------------- History:   Risk factors:  Former tobacco use. Hypertension. Diabetes mellitus.  ------------------------------------------------------------------- Study Conclusions  - Left ventricle: The cavity size was normal. Wall thickness was normal. Systolic function was normal. The estimated ejection fraction was in the range of  60% to 65%. Wall motion was normal; there were no regional wall motion abnormalities. Doppler parameters are consistent with abnormal left ventricular relaxation (grade 1 diastolic dysfunction). - Mitral valve: Valve area by pressure half-time: 0.72 cm^2.  ------------------------------------------------------------------- Study data:  Comparison was made to the study of 02/22/2017.  Study status:  Routine.  Procedure:  The patient reported no pain pre or post test. Transthoracic echocardiography. Image quality was adequate.  Study completion:  There were no complications. Transthoracic echocardiography.  M-mode, complete 2D, spectral Doppler, and color Doppler.  Birthdate:  Patient birthdate: 24-Mar-1971.  Age:  Patient is 52 yr old.  Sex:  Gender: female. BMI: 43.7 kg/m^2.  Blood pressure:     142/70  Patient status: Inpatient.  Study date:  Study date: 06/04/2017. Study time: 10:28 AM.  Location:  Bedside.  -------------------------------------------------------------------  ------------------------------------------------------------------- Left ventricle:  The cavity size was normal. Wall thickness was normal. Systolic function was normal. The estimated ejection fraction was in the range of 60% to 65%. Wall motion was normal; there were no regional wall motion abnormalities. Doppler parameters are consistent with abnormal left ventricular relaxation (grade 1 diastolic dysfunction).  ------------------------------------------------------------------- Aortic valve:   Structurally normal valve.   Cusp separation was normal.  Doppler:  Transvalvular velocity was within the normal range. There was no stenosis. There was no regurgitation.  ------------------------------------------------------------------- Aorta:  Aortic root: The aortic root was normal in size. Ascending aorta: The ascending aorta was normal in  size.  ------------------------------------------------------------------- Mitral valve:   Structurally normal valve.   Leaflet separation was normal.  Doppler:  Transvalvular velocity was within the normal range. There was no evidence for stenosis. There was no regurgitation.    Valve area by pressure half-time: 0.72 cm^2. Indexed valve area by pressure half-time: 0.33 cm^2/m^2.    Peak gradient (D): 3 mm Hg.  ------------------------------------------------------------------- Left atrium:  The atrium was normal in size.  ------------------------------------------------------------------- Right ventricle:  The cavity size was normal. Systolic function was normal.  ------------------------------------------------------------------- Pulmonic valve:    The valve appears to be grossly normal. Doppler:  There was no significant regurgitation.  ------------------------------------------------------------------- Tricuspid valve:   Structurally normal valve.   Leaflet separation was normal.  Doppler:  Transvalvular velocity was within the normal range. There was trivial regurgitation.    Mean gradient (D): 27 mm Hg.  ------------------------------------------------------------------- Right atrium:  The atrium was normal in size.  ------------------------------------------------------------------- Pericardium:  There was no pericardial effusion.  ------------------------------------------------------------------- Measurements  Left ventricle                            Value          Reference LV ID, ED, PLAX chordal                   45.2  mm       43 - 52 LV ID, ES, PLAX chordal  27.1  mm       23 - 38 LV fx shortening, PLAX chordal            40    %        >=29 LV PW thickness, ED                       10.9  mm       --------- IVS/LV PW ratio, ED                       0.96           <=1.3 LV e&', lateral                            8.05  cm/s     --------- LV  E/e&', lateral                          10.72          --------- LV e&', medial                             6.42  cm/s     --------- LV E/e&', medial                           13.44          --------- LV e&', average                            7.24  cm/s     --------- LV E/e&', average                          11.93          ---------  Ventricular septum                        Value          Reference IVS thickness, ED                         10.5  mm       ---------  LVOT                                      Value          Reference LVOT ID, S                                21    mm       --------- LVOT area                                 3.46  cm^2     --------- LVOT VTI, S                               21.4  cm       ---------  Aorta                                     Value          Reference Aortic root ID                            30    mm       ---------  Left atrium                               Value          Reference LA ID, A-P, ES                            32    mm       --------- LA ID/bsa, A-P                            1.46  cm/m^2   <=2.2 LA volume, ES, 1-p A4C                    35.1  ml       --------- LA volume/bsa, ES, 1-p A4C                16    ml/m^2   --------- LA volume, ES, 1-p A2C                    31.7  ml       --------- LA volume/bsa, ES, 1-p A2C                14.5  ml/m^2   ---------  Mitral valve                              Value          Reference Mitral E-wave peak velocity               86.3  cm/s     --------- Mitral A-wave peak velocity               107   cm/s     --------- Mitral pressure half-time                 306   ms       --------- Mitral peak gradient, D                   3     mm Hg    --------- Mitral E/A ratio, peak                    0.81           --------- Mitral valve area, PHT, DP                0.72  cm^2     --------- Mitral valve area/bsa, PHT, DP            0.33  cm^2/m^2 ---------  Tricuspid valve  Value          Reference Tricuspid mean gradient, D                27    mm Hg    --------- Tricuspid maximal regurg                  261   cm/s     --------- velocity, PISA  Right atrium                              Value          Reference RA volume, ES, 1-p A4C                    45.3  ml       --------- RA volume/bsa, ES, 1-p A4C                20.7  ml/m^2   ---------  Systemic veins                            Value          Reference Estimated CVP                             3     mm Hg    ---------  Right ventricle                           Value          Reference RV s&', lateral, S                         12.1  cm/s     ---------  Legend: (L)  and  (H)  mark values outside specified reference range.  ------------------------------------------------------------------- Prepared and Electronically Authenticated by  Kristeen Miss, M.D. 2018-09-13T14:36:55              Risk Assessment/Calculations:              Physical Exam:   VS:  BP 114/82   Pulse (!) 102   Ht 5\' 1"  (1.549 m)   Wt 264 lb (119.7 kg)   LMP  (LMP Unknown)   SpO2 98%   BMI 49.88 kg/m    Wt Readings from Last 3 Encounters:  01/22/23 264 lb (119.7 kg)  01/15/23 269 lb (122 kg)  10/09/22 244 lb 9.6 oz (110.9 kg)     GEN: Well nourished, well developed in no acute distress NECK: No JVD; No carotid bruits CARDIAC: Tachycardic, regular, no murmurs RESPIRATORY:  Clear to auscultation without rales, wheezing or rhonchi  ABDOMEN: Soft, non-tender, non-distended EXTREMITIES:  No edema; No deformity   ASSESSMENT AND PLAN:   #SOB: #Chest Pain: -Unclear etiology given complex situation with COPD/trach/subglottic stenosis although she has significant cardiac risk factors -Check TTE -Will check cardiac PET if patient can tolerate  #Acute on Chronic Diastolic HF: -TTE 2018 with EF 60-65% with G1DD -Currently mildly overloaded on exam with LE edema -Increase lasix to 40mg  daily -Continue  farxiga 10mg  daily -Add spiro 25mg  daily  #DMII: -Poorly controlled with A1C 11.2 -Continue insulin, mounjaro and farxiga -Management per PCP  #HTN: -Continue lisinopril 20mg  daily -Continue amlodipine 10mg  daily  #COPD: -Management  per pulm  #S/p Trach: #Subglottic Stenosis: -Required trach after intubation injury -Follows with Pulm and ENT      Shared Decision Making/Informed Consent The risks [chest pain, shortness of breath, cardiac arrhythmias, dizziness, blood pressure fluctuations, myocardial infarction, stroke/transient ischemic attack, nausea, vomiting, allergic reaction, radiation exposure, metallic taste sensation and life-threatening complications (estimated to be 1 in 10,000)], benefits (risk stratification, diagnosing coronary artery disease, treatment guidance) and alternatives of a cardiac PET stress test were discussed in detail with Ms. Bradley and she agrees to proceed.   Signed, Meriam Sprague, MD

## 2023-01-21 NOTE — Telephone Encounter (Signed)
Requested medication (s) are due for refill today: Yes  Requested medication (s) are on the active medication list: Yes  Last refill:  12/15/22  Future visit scheduled: No  Notes to clinic:  See request.    Requested Prescriptions  Pending Prescriptions Disp Refills   fluconazole (DIFLUCAN) 150 MG tablet [Pharmacy Med Name: Fluconazole 150 MG Oral Tablet] 1 tablet 0    Sig: TAKE 1 TABLET BY MOUTH AS A ONE TIME DOSE.     Off-Protocol Failed - 01/19/2023 10:19 PM      Failed - Medication not assigned to a protocol, review manually.      Passed - Valid encounter within last 12 months    Recent Outpatient Visits           6 days ago Type 1 diabetes mellitus without complication (HCC)   [place] [facility] [place], [place], MD   3 months ago Type 1 diabetes mellitus with other specified complication [facility])   [facility] [place], [place], MD   6 months ago Type 1 diabetes mellitus with other specified complication [facility])   [place] [facility] [place], Odette Horns, MD   9 months ago Type 1 diabetes mellitus with other specified complication [facility])   [place] [facility] [place], Odette Horns, MD   1 year ago Encounter for [employer] annual wellness exam   [facility] Hoy Register, MD       Future Appointments             Tomorrow Meriam Sprague, MD [facility]   In 5 days August Saucer, Corrie Mckusick, MD [facility] [place]   In 1 week Glenford Bayley, NP [facility] [place] [facility] [place]   In 3 months Hoy Register, MD [facility]

## 2023-01-21 NOTE — Telephone Encounter (Signed)
Requested medication (s) are due for refill today:   Yes/provider to review  Requested medication (s) are on the active medication list:   Yes for all 3  Future visit scheduled:   Yes     Seen 6 days ago   Last ordered: Zanaflex 12/25/2022 #60, 0 refills;   Freestyle Libre 2 Sensor 11/03/2022 2 each, 2 refills;   rosuvastatin 07/10/2022 #90, 1 refill  Returned because of non delegated medication   Requested Prescriptions  Pending Prescriptions Disp Refills   tiZANidine (ZANAFLEX) 4 MG tablet [Pharmacy Med Name: tiZANidine HCl 4 MG Oral Tablet] 60 tablet 0    Sig: TAKE 1 TABLET BY MOUTH EVERY 8 HOURS AS NEEDED FOR MUSCLE SPASM     Not Delegated - Cardiovascular:  Alpha-2 Agonists - tizanidine Failed - 01/19/2023 10:19 PM      Failed - This refill cannot be delegated      Passed - Valid encounter within last 6 months    Recent Outpatient Visits           6 days ago Type 1 diabetes mellitus without complication (HCC)   [place] [facility] [place], [place], MD   3 months ago Type 1 diabetes mellitus with other specified complication [facility])   [place] [facility] [place], [place], MD   6 months ago Type 1 diabetes mellitus with other specified complication [facility])   [place] [facility] [place], [place], MD   9 months ago Type 1 diabetes mellitus with other specified complication [facility])   [place] [facility] [place], Odette Horns, MD   1 year ago Encounter for [employer] annual wellness exam   [place] [facility] Hoy Register, MD       Future Appointments             Tomorrow Meriam Sprague, MD [place] [facility]   In 5 days August Saucer, Corrie Mckusick, MD [facility] [place]   In 1 week Glenford Bayley, NP [place] [facility] [place]   In 3 months Hoy Register, MD [facility]             Continuous Glucose Sensor (FREESTYLE LIBRE 2 SENSOR) MISC [Pharmacy Med Name: FREESTYLE LIBRE 2 SENSOR KIT] 2 each 0    Sig: USE TO CHECK BLOOD GLUCOSE THREE TIMES DAILY.  CHANGE SENSOR ONCE EVERY 14 DAYS.     Endocrinology: Diabetes - Testing Supplies Passed - 01/19/2023 10:19 PM      Passed - Valid encounter within last 12 months    Recent Outpatient Visits           6 days ago Type 1 diabetes mellitus without complication [facility])   [place] [facility] [address], [place], MD   3 months ago Type 1 diabetes mellitus with other specified complication [facility])   [place] [facility] [place], [place], MD   6 months ago Type 1 diabetes mellitus with other specified complication [facility])   [place] [facility] [place], Odette Horns, MD   9 months ago Type 1 diabetes mellitus with other specified complication [facility])   [place] [facility] Hoy Register, MD   1 year ago Encounter for [employer] annual wellness exam   [facility] Hoy Register, MD  Future Appointments             Tomorrow Meriam Sprague, MD [place] [facility], [facility]   In 5 days August Saucer, Corrie Mckusick, MD [facility]   In 1 week Glenford Bayley, NP [place] [facility] [place]   In 3 months Hoy Register, MD [facility]             rosuvastatin (CRESTOR) 20 MG tablet [place] Med Name: Rosuvastatin Calcium 20 MG Oral Tablet] 90 tablet 0    Sig: Take 1 tablet by mouth once daily     Cardiovascular:  Antilipid - Statins 2 Failed - 01/19/2023 10:19 PM      Failed - Lipid Panel in normal range within the last 12 months    Cholesterol, Total  Date Value Ref Range Status  10/09/2022 144 100 - 199 mg/dL Final   LDL Chol Calc (NIH)  Date Value Ref  Range Status  10/09/2022 68 0 - 99 mg/dL Final   HDL  Date Value Ref Range Status  10/09/2022 54 >39 mg/dL Final   Triglycerides  Date Value Ref Range Status  10/09/2022 124 0 - 149 mg/dL Final         Passed - Cr in normal range and within 360 days    Creat  Date Value Ref Range Status  08/01/2015 0.93 0.50 - 1.10 mg/dL Final   Creatinine, Ser  Date Value Ref Range Status  10/09/2022 0.99 0.57 - 1.00 mg/dL Final   Creatinine,U  Date Value Ref Range Status  01/30/2016 352.5 mg/dL Final         Passed - Patient is not pregnant      Passed - Valid encounter within last 12 months    Recent Outpatient Visits           6 days ago Type 1 diabetes mellitus without complication (HCC)   [place] [facility] [place], Odette Horns, MD   3 months ago Type 1 diabetes mellitus with other specified complication [facility])   [place] [facility] [place], Odette Horns, MD   6 months ago Type 1 diabetes mellitus with other specified complication [facility])   [place] [facility] [place], Odette Horns, MD   9 months ago Type 1 diabetes mellitus with other specified complication [facility])   [facility] [place], Odette Horns, MD   1 year ago Encounter for [employer] annual wellness exam   [place] [facility] Hoy Register, MD       Future Appointments             Tomorrow Meriam Sprague, MD [facility]   In 5 days August Saucer, Corrie Mckusick, MD [facility] [place]   In 1 week Glenford Bayley, NP   Pulmonary Care at [place]   In 3 months Hoy Register, MD [facility]

## 2023-01-22 ENCOUNTER — Encounter: Payer: Self-pay | Admitting: Cardiology

## 2023-01-22 ENCOUNTER — Encounter: Payer: Self-pay | Admitting: *Deleted

## 2023-01-22 ENCOUNTER — Ambulatory Visit: Payer: 59 | Attending: Cardiology | Admitting: Cardiology

## 2023-01-22 VITALS — BP 114/82 | HR 102 | Ht 61.0 in | Wt 264.0 lb

## 2023-01-22 DIAGNOSIS — I5033 Acute on chronic diastolic (congestive) heart failure: Secondary | ICD-10-CM | POA: Diagnosis not present

## 2023-01-22 DIAGNOSIS — R0602 Shortness of breath: Secondary | ICD-10-CM | POA: Diagnosis not present

## 2023-01-22 DIAGNOSIS — Z79899 Other long term (current) drug therapy: Secondary | ICD-10-CM

## 2023-01-22 DIAGNOSIS — I10 Essential (primary) hypertension: Secondary | ICD-10-CM | POA: Diagnosis not present

## 2023-01-22 DIAGNOSIS — Z930 Tracheostomy status: Secondary | ICD-10-CM

## 2023-01-22 DIAGNOSIS — E11610 Type 2 diabetes mellitus with diabetic neuropathic arthropathy: Secondary | ICD-10-CM

## 2023-01-22 DIAGNOSIS — I5032 Chronic diastolic (congestive) heart failure: Secondary | ICD-10-CM

## 2023-01-22 DIAGNOSIS — R079 Chest pain, unspecified: Secondary | ICD-10-CM

## 2023-01-22 MED ORDER — AMLODIPINE BESYLATE 5 MG PO TABS
5.0000 mg | ORAL_TABLET | Freq: Every day | ORAL | 3 refills | Status: DC
Start: 2023-01-22 — End: 2023-07-06

## 2023-01-22 MED ORDER — SPIRONOLACTONE 25 MG PO TABS
25.0000 mg | ORAL_TABLET | Freq: Every day | ORAL | 2 refills | Status: DC
Start: 2023-01-22 — End: 2023-03-13

## 2023-01-22 MED ORDER — FUROSEMIDE 40 MG PO TABS
40.0000 mg | ORAL_TABLET | Freq: Every day | ORAL | 2 refills | Status: DC
Start: 2023-01-22 — End: 2023-02-02

## 2023-01-22 NOTE — Patient Instructions (Addendum)
Medication Instructions:   START TAKING SPIRONOLACTONE 25 MG BY MOUTH DAILY  INCREASE YOUR FUROSEMIDE (LASIX) TO 40 MG BY MOUTH DAILY  DECREASE YOUR AMLODIPINE TO 5 MG BY MOUTH DAILY  *If you need a refill on your cardiac medications before your next appointment, please call your pharmacy*   Lab Work:  ONE WEEK HERE IN THE OFFICE--BMET  If you have labs (blood work) drawn today and your tests are completely normal, you will receive your results only by: MyChart Message (if you have MyChart) OR A paper copy in the mail If you have any lab test that is abnormal or we need to change your treatment, we will call you to review the results.    Testing/Procedures:  Your physician has requested that you have an echocardiogram. Echocardiography is a painless test that uses sound waves to create images of your heart. It provides your doctor with information about the size and shape of your heart and how well your heart's chambers and valves are working. This procedure takes approximately one hour. There are no restrictions for this procedure. Please do NOT wear cologne, perfume, aftershave, or lotions (deodorant is allowed). Please arrive 15 minutes prior to your appointment time.    How to Prepare for Your Cardiac PET/CT Stress Test:  1. Please do not take these medications before your test:    Your remaining medications may be taken with water.  2. Nothing to eat or drink, except water, 3 hours prior to arrival time.   NO caffeine/decaffeinated products, or chocolate 12 hours prior to arrival.  3. NO perfume, cologne or lotion  4. Total time is 1 to 2 hours; you may want to bring reading material for the waiting time.  5. Please report to Radiology at the [facility] Main Entrance 30 minutes early for your test.  [address] [place], [place] [address]  Diabetic Preparation:  Hold oral medications. You may take NPH and Lantus insulin. Do not take Humalog or  Humulin R (Regular Insulin) the day of your test. Check blood sugars prior to leaving the house. If able to eat breakfast prior to 3 hour fasting, you may take all medications, including your insulin, Do not worry if you miss your breakfast dose of insulin - start at your next meal.  IF YOU THINK YOU MAY BE PREGNANT, OR ARE NURSING PLEASE INFORM THE TECHNOLOGIST.  In preparation for your appointment, medication and supplies will be purchased.  Appointment availability is limited, so if you need to cancel or reschedule, please call the Radiology Department at [phone]  24 hours in advance to avoid a cancellation fee of $100.00  What to Expect After you Arrive:  Once you arrive and check in for your appointment, you will be taken to a preparation room within the Radiology Department.  A technologist or Nurse will obtain your medical history, verify that you are correctly prepped for the exam, and explain the procedure.  Afterwards,  an IV will be started in your arm and electrodes will be placed on your skin for EKG monitoring during the stress portion of the exam. Then you will be escorted to the PET/CT scanner.  There, staff will get you positioned on the scanner and obtain a blood pressure and EKG.  During the exam, you will continue to be connected to the EKG and blood pressure machines.  A small, safe amount of a radioactive tracer will be injected in your IV to obtain a series of pictures  of your heart along with an injection of a stress agent.    After your Exam:  It is recommended that you eat a meal and drink a caffeinated beverage to counter act any effects of the stress agent.  Drink plenty of fluids for the remainder of the day and urinate frequently for the first couple of hours after the exam.  Your doctor will inform you of your test results within 7-10 business days.  For questions about your test or how to prepare for your test, please call: Rockwell Alexandria, Cardiac Imaging Nurse  Navigator  Larey Brick, Cardiac Imaging Nurse Navigator Office: [phone]    Follow-Up: At [facility], you and your health needs are our priority.  As part of our continuing mission to provide you with exceptional heart care, we have created designated Provider Care Teams.  These Care Teams include your primary Cardiologist (physician) and Advanced Practice Providers (APPs -  Physician Assistants and Nurse Practitioners) who all work together to provide you with the care you need, when you need it.  We recommend signing up for the patient portal called "MyChart".  Sign up information is provided on this After Visit Summary.  MyChart is used to connect with patients for Virtual Visits (Telemedicine).  Patients are able to view lab/test results, encounter notes, upcoming appointments, etc.  Non-urgent messages can be sent to your provider as well.   To learn more about what you can do with MyChart, go to [email or web address].    Your next appointment:   6 MONTHS WITH DR. Shari Prows

## 2023-01-22 NOTE — Addendum Note (Signed)
Addended by: Laurance Flatten on: 01/22/2023 04:18 PM   Modules accepted: Orders

## 2023-01-22 NOTE — Addendum Note (Signed)
Addended by: Laurance Flatten on: 01/22/2023 04:26 PM   Modules accepted: Orders

## 2023-01-23 ENCOUNTER — Ambulatory Visit
Admission: RE | Admit: 2023-01-23 | Discharge: 2023-01-23 | Disposition: A | Discharge status: Home / Self Care | Payer: 59 | Source: Ambulatory Visit | Attending: Family Medicine | Admitting: Family Medicine

## 2023-01-23 DIAGNOSIS — Z1231 Encounter for screening mammogram for malignant neoplasm of breast: Secondary | ICD-10-CM

## 2023-01-26 ENCOUNTER — Other Ambulatory Visit: Payer: Self-pay

## 2023-01-26 ENCOUNTER — Encounter: Payer: Self-pay | Admitting: Family Medicine

## 2023-01-26 ENCOUNTER — Ambulatory Visit (INDEPENDENT_AMBULATORY_CARE_PROVIDER_SITE_OTHER): Payer: 59 | Admitting: Orthopedic Surgery

## 2023-01-26 ENCOUNTER — Other Ambulatory Visit (INDEPENDENT_AMBULATORY_CARE_PROVIDER_SITE_OTHER): Payer: 59

## 2023-01-26 DIAGNOSIS — M4326 Fusion of spine, lumbar region: Secondary | ICD-10-CM

## 2023-01-26 DIAGNOSIS — M5416 Radiculopathy, lumbar region: Secondary | ICD-10-CM

## 2023-01-27 ENCOUNTER — Encounter: Payer: Self-pay | Admitting: Orthopedic Surgery

## 2023-01-27 ENCOUNTER — Other Ambulatory Visit: Payer: Self-pay

## 2023-01-27 ENCOUNTER — Telehealth: Payer: Self-pay | Admitting: *Deleted

## 2023-01-27 ENCOUNTER — Encounter: Payer: Self-pay | Admitting: Family Medicine

## 2023-01-27 NOTE — Progress Notes (Signed)
Office Visit Note   Patient: [name]           Date of Birth: Jan 01, 1971           MRN: [id number] Visit Date: 01/26/2023 Requested by: Cammy Copa, MD [address] [place],  [place] [address] PCP: Hoy Register, MD  Subjective: Chief Complaint  Patient presents with   Lower Back - Pain    HPI: [name] is a 52 y.o. female who presents to the office reporting worsening left hip and back pain.  She has had it for a year.  She had back surgery for right-sided symptoms with Dr. Otelia Sergeant.  Describes left-sided radiation with numbness and tingling and pain which wakes her from sleep at night.  She states "my back is swelling."  Taking oxycodone and muscle relaxers but no more gabapentin.  Worse since January.  She did have an MRI scan in October 2023 but her symptoms have exponentially worsened since that time.  Radiates on the right side down to the back of the knee but the left side down to the ankle..                ROS: All systems reviewed are negative as they relate to the chief complaint within the history of present illness.  Patient denies fevers or chills.  Assessment & Plan: Visit Diagnoses:  1. Fusion of spine of lumbar region   2. Radiculopathy, lumbar region     Plan: Impression is contralateral side radicular symptoms following fusion surgery in the lumbar spine.  MRI from October looked reasonable but her symptoms have exacerbated and worsened significantly in the last 6 months.  Plan at this time is MRI lumbar spine to evaluate left-sided radiculopathy.  Follow-up after that study.  May need to see Dr. Christell Constant for surgical evaluation depending on the findings  Follow-Up Instructions: No follow-ups on file.   Orders:  Orders Placed This Encounter  Procedures   XR Lumbar Spine 2-3 Views   MR Lumbar Spine w/o contrast   No orders of the defined types were placed in this encounter.     Procedures: No procedures  performed   Clinical Data: No additional findings.  Objective: Vital Signs: LMP  (LMP Unknown)   Physical Exam:  Constitutional: Patient appears well-developed HEENT:  Head: Normocephalic Eyes:EOM are normal Neck: Normal range of motion Cardiovascular: Normal rate Pulmonary/chest: Effort normal Neurologic: Patient is alert Skin: Skin is warm Psychiatric: Patient has normal mood and affect  Ortho Exam: Ortho exam demonstrates nerve root tension signs left greater than right.  5 out of 5 ankle dorsiflexion plantarflexion quad hamstring strength.  Pedal pulses palpable.  No masses lymphadenopathy or skin changes noted in that leg region or in the incision on her back.  She does have discomfort with ambulation.  Specialty Comments:  MRI LUMBAR SPINE WITHOUT AND WITH CONTRAST   TECHNIQUE: Multiplanar and multiecho pulse sequences of the lumbar spine were obtained without and with intravenous contrast.   CONTRAST:  20mL MULTIHANCE GADOBENATE DIMEGLUMINE 529 MG/ML IV SOLN   COMPARISON:  Lumbar radiographs 01/02/2022, lumbar spine MRI 12/09/2020   FINDINGS: Segmentation: Standard; the lowest formed disc space is designated L5-S1.   Alignment: There is straightening of the normal lumbar lordosis. There is no antero or retrolisthesis.   Vertebrae: There has been interval posterior instrumented fusion at L4-L5 with suspected right laminectomy. There is no evidence of complication. Marrow signal is otherwise normal. There is no suspicious marrow  signal abnormality or marrow edema. There is no abnormal marrow enhancement.   Conus medullaris and cauda equina: Conus extends to the L1 level. Conus and cauda equina appear normal. There is no abnormal enhancement of the cauda equina nerve roots.   Paraspinal and other soft tissues: Postsurgical changes are noted in the soft tissues posterior to the surgical levels. The paraspinal soft tissues are otherwise unremarkable.   Disc  levels:   The disc heights at the nonsurgical levels are preserved.   T12-L1: No significant spinal canal or neural foraminal stenosis   L1-L2: No significant spinal canal or neural foraminal stenosis   L2-L3: No significant spinal canal or neural foraminal stenosis   L3-L4: There is a minimal disc bulge without significant spinal canal or neural foraminal stenosis   L4-L5: Status post posterior instrumented fusion and suspected right laminectomy. The previously seen disc protrusion has resolved following surgery. There is residual mild narrowing of the left subarticular zone without evidence of frank nerve root impingement. There is no residual spinal canal or neural foraminal stenosis. There is no convincing evidence of nerve root impingement or significant neural foraminal stenosis.   L5-S1: No significant spinal canal or neural foraminal stenosis.   IMPRESSION: 1. Status post posterior instrumented fusion and suspected right laminectomy at L4-L5. The previously seen disc protrusion on the study from 12/19/2020 is no longer present, and previously seen spinal canal stenosis has significantly improved. There is residual mild narrowing of the left subarticular zone without evidence of frank nerve root impingement. No other convincing evidence of nerve root impingement to explain the patient's right-sided symptoms. 2. No significant spinal canal or neural foraminal stenosis at the other levels.     Electronically Signed   By: Lesia Hausen M.D.   On: 01/16/2022 13:29  Imaging: XR Lumbar Spine 2-3 Views  Result Date: 01/27/2023 AP lateral radiographs lumbar spine reviewed.  Lumbar fusion hardware in good position alignment with no complicating features.  No new fracture.  No spondylolisthesis.  No scoliosis.    PMFS History: Patient Active Problem List   Diagnosis Date Noted   Severe persistent asthma without complication 01/15/2023   Moderate episode of recurrent major  depressive disorder (HCC) 01/15/2023   Gastritis and gastroduodenitis    Fusion of spine of lumbar region 08/20/2021   Spondylolisthesis, lumbar region 08/20/2021    Class: Chronic   Chronic respiratory failure with hypoxia (HCC) 06/20/2021   Recurrent vomiting 11/28/2020   Insulin dependent type 2 diabetes mellitus (HCC) 04/12/2019   Chronic right shoulder pain    Chronic bilateral low back pain without sciatica 10/05/2018   Dysphonia 10/05/2018   Rotator cuff arthropathy, right 06/15/2018   AKI (acute kidney injury) (HCC) 03/06/2018   Carpal tunnel syndrome 01/18/2018   Normocytic anemia 05/06/2017   Tracheostomy dependent (HCC) 03/26/2017   Morbid obesity with BMI of 40.0-44.9, adult (HCC) 03/13/2017   Subglottic stenosis 03/06/2017   Asthma 03/06/2017   Chronic diastolic heart failure (HCC) 03/06/2017   Tracheal stenosis 03/04/2017   Generalized anxiety disorder    Hemoptysis    Chronic pain syndrome    GERD (gastroesophageal reflux disease) 02/22/2016   Depression 02/22/2016   Migraine 01/30/2015   Bulging lumbar disc 09/05/2013   Essential hypertension 09/05/2013   Allergic rhinitis, seasonal 08/11/2012   Chronic cough 08/11/2012   OSA (obstructive sleep apnea) 12/04/2011   Past Medical History:  Diagnosis Date   Abnormal UGI series    Acute pain of right shoulder due to trauma  10/05/2018   Acute respiratory failure with hypoxia and hypercapnia (HCC) 10/23/2018   Allergy    Anemia    Anxiety    Arthritis    Asthma    Chest pain of uncertain etiology 03/22/2019   Chronic back pain    Chronic chest pain    resolved, no problems since 2019 per patient 07/27/19   Coffee ground vomiting 10/14/2021   COPD (chronic obstructive pulmonary disease) (HCC)    inhaler   Depression    DM (diabetes mellitus) (HCC)    INSULIN DEPENDENT - TYPE 1   Dyspnea    GERD (gastroesophageal reflux disease)    Headache(784.0)    History of hiatal hernia    Hyperlipidemia    no med,  diet controlled   Hypertension    Hypokalemia    Klebsiella cystitis 03/14/2017   Last Assessment & Plan:   Formatting of this note might be different from the original.  - On Zosyn  - Urine culture: Klebsiella pneumoniae   Osteoarthritis    Pneumonia    Respiratory disease 05/2017   uses inhalers, neb tx prn, no oxygen   Rotator cuff tear 07/28/2019   Sleep apnea    does not use CPAP due to trach, uses 2L supplement oxygen at night   Status post tracheostomy (HCC) 03/14/2017   Last Assessment & Plan:  Formatting of this note might be different from the original. - VERY CRITICAL AIRWAY WITH EXTREME PRECUATION - On tach - ENT following along (please see above for current trach management plan)   Tracheostomy in place [facility]) 02/2017    Family History  Problem Relation Age of Onset   Heart attack Mother    Stroke Mother    Diabetes Mother    Hypertension Mother    Arthritis Mother    Stroke Father    Asthma Sister    Hypertension Sister    Asthma Sister    Diabetes Sister    Asthma Brother    Seizures Brother    Asthma Brother    Diabetes Brother    Asthma Daughter    Asthma Son    Asthma Grandson    Colon cancer Neg Hx    Rectal cancer Neg Hx    Stomach cancer Neg Hx    Esophageal cancer Neg Hx    Ovarian cancer Neg Hx    Pancreatic cancer Neg Hx     Past Surgical History:  Procedure Laterality Date   ABDOMINAL HYSTERECTOMY  2009   APPENDECTOMY     BIOPSY  10/15/2021   Procedure: BIOPSY;  Surgeon: Tressia Danas, MD;  Location: [facility] ENDOSCOPY;  Service: Gastroenterology;;   CESAREAN SECTION     x 3   CHOLECYSTECTOMY N/A 03/02/2014   Procedure: LAPAROSCOPIC CHOLECYSTECTOMY;  Surgeon: Clovis Pu. Cornett, MD;  Location: [facility] OR;  Service: General;  Laterality: N/A;   ESOPHAGOGASTRODUODENOSCOPY (EGD) WITH PROPOFOL N/A 10/15/2021   Procedure: ESOPHAGOGASTRODUODENOSCOPY (EGD) WITH PROPOFOL;  Surgeon: Tressia Danas, MD;  Location[facility] ENDOSCOPY;  Service: Gastroenterology;   Laterality: N/A;   HERNIA REPAIR     PANENDOSCOPY N/A 03/04/2017   Procedure: PANENDOSCOPY WITH POSSIBLE FOREIGN BODY REMOVAL;  Surgeon: Flo Shanks, MD;  Location[facility] OR;  Service: ENT;  Laterality: N/A;   ROTATOR CUFF REPAIR Left    ROTATOR CUFF REPAIR Right 07/27/2019   SHOULDER ARTHROSCOPY WITH SUBACROMIAL DECOMPRESSION, ROTATOR CUFF REPAIR AND BICEP TENDON REPAIR Right 07/28/2019   Procedure: RIGHT SHOULDER ARTHROSCOP, MINI OPEN ROTATOR CUFF TEAR REPAIR,  BICEPS  TENODESIS, DISTAL CLAVICLE EXCISION;  Surgeon: Cammy Copa, MD;  Location: [facility] OR;  Service: Orthopedics;  Laterality: Right;   TOOTH EXTRACTION N/A 02/22/2021   Procedure: DENTAL RESTORATION/EXTRACTIONS;  Surgeon: Ocie Doyne, DMD;  Location: [facility] OR;  Service: Oral Surgery;  Laterality: N/A;   TRACHEOSTOMY  02/2017   TUBAL LIGATION     VESICOVAGINAL FISTULA CLOSURE W/ TAH  2009   Social History   Occupational History   Not on file  Tobacco Use   Smoking status: Former    Packs/day: 0.50    Years: 25.00    Additional pack years: 0.00    Total pack years: 12.50    Types: Cigarettes    Quit date: 01/21/2016    Years since quitting: 7.0   Smokeless tobacco: Never  Vaping Use   Vaping Use: Never used  Substance and Sexual Activity   Alcohol use: Yes    Comment: social wine   Drug use: Not Currently    Types: Marijuana    Comment: couple times a month, Last use was on   Sexual activity: Yes    Partners: Male    Birth control/protection: Surgical    Comment: Hysterectomy

## 2023-01-27 NOTE — Progress Notes (Signed)
  Care Coordination Note  01/27/2023 Name: [name] MRN: [id number] DOB: 01-01-1971  [name] is a 52 y.o. year old female who is a primary care patient of Hoy Register, MD and is actively engaged with the care management team. I reached out to [name] by phone today to assist with re-scheduling a follow up visit with the Licensed Clinical Social Worker  Follow up plan: Unsuccessful telephone outreach attempt made. A HIPAA compliant phone message was left for the patient providing contact information and requesting a return call.   Burman Nieves, CCMA Care Coordination Care Guide Direct Dial: [phone]

## 2023-01-28 ENCOUNTER — Encounter: Payer: Self-pay | Admitting: Primary Care

## 2023-01-28 ENCOUNTER — Ambulatory Visit (INDEPENDENT_AMBULATORY_CARE_PROVIDER_SITE_OTHER): Payer: 59 | Admitting: Primary Care

## 2023-01-28 ENCOUNTER — Ambulatory Visit (INDEPENDENT_AMBULATORY_CARE_PROVIDER_SITE_OTHER): Payer: 59

## 2023-01-28 VITALS — BP 126/76 | HR 112 | Temp 99.9°F | Ht 61.0 in | Wt 260.0 lb

## 2023-01-28 DIAGNOSIS — R059 Cough, unspecified: Secondary | ICD-10-CM | POA: Diagnosis not present

## 2023-01-28 DIAGNOSIS — J4550 Severe persistent asthma, uncomplicated: Secondary | ICD-10-CM

## 2023-01-28 DIAGNOSIS — I5032 Chronic diastolic (congestive) heart failure: Secondary | ICD-10-CM

## 2023-01-28 DIAGNOSIS — R0602 Shortness of breath: Secondary | ICD-10-CM | POA: Diagnosis not present

## 2023-01-28 NOTE — Progress Notes (Signed)
@Patient  ID: [name], female    DOB: 04-20-1971, 52 y.o.   MRN: [id number]  Chief Complaint  Patient presents with   Follow-up    Wheeze cough and SOB worse x 1 month.    Referring provider: Hoy Register, MD  HPI: 52 year old female, former smoker.  Past medical history significant for hypertension, chronic diastolic heart failure, OSA, chronic respiratory failure with hypoxia, severe persistent asthma, type 1 diabetes.  01/28/2023 Patient presents today for overdue follow-up. She reports worsening shortness of breath, wheezing and cough for the last month. Cough is worse at night. Cough can be productive without purulence. She sleeps with 6 pillows. She is compliant with Dulera. Using nebulizer daily. Taking lasix 40mg  daily. Weight is up some along with leg swelling.   She saw cardiology on 01/22/23. Lasix was increased to 40mg  daily and she was started on spironolactone. She has been ordered for echocardiogram.    Allergies  Allergen Reactions   Reglan [Metoclopramide] Other (See Comments)    Panic attack    Immunization History  Administered Date(s) Administered   Influenza,inj,Quad PF,6+ Mos 06/05/2014, 08/01/2015, 07/02/2016, 06/19/2017, 06/20/2021, 07/09/2022   PFIZER(Purple Top)SARS-COV-2 Vaccination 08/29/2020   Pneumococcal Polysaccharide-23 04/29/2013, 02/21/2017   Zoster Recombinat (Shingrix) 10/09/2022    Past Medical History:  Diagnosis Date   Abnormal UGI series    Acute pain of right shoulder due to trauma 10/05/2018   Acute respiratory failure with hypoxia and hypercapnia (HCC) 10/23/2018   Allergy    Anemia    Anxiety    Arthritis    Asthma    Chest pain of uncertain etiology 03/22/2019   Chronic back pain    Chronic chest pain    resolved, no problems since 2019 per patient 07/27/19   Coffee ground vomiting 10/14/2021   COPD (chronic obstructive pulmonary disease) (HCC)    inhaler   Depression    DM (diabetes mellitus) (HCC)     INSULIN DEPENDENT - TYPE 1   Dyspnea    GERD (gastroesophageal reflux disease)    Headache(784.0)    History of hiatal hernia    Hyperlipidemia    no med, diet controlled   Hypertension    Hypokalemia    Klebsiella cystitis 03/14/2017   Last Assessment & Plan:   Formatting of this note might be different from the original.  - On Zosyn  - Urine culture: Klebsiella pneumoniae   Osteoarthritis    Pneumonia    Respiratory disease 05/2017   uses inhalers, neb tx prn, no oxygen   Rotator cuff tear 07/28/2019   Sleep apnea    does not use CPAP due to trach, uses 2L supplement oxygen at night   Status post tracheostomy (HCC) 03/14/2017   Last Assessment & Plan:  Formatting of this note might be different from the original. - VERY CRITICAL AIRWAY WITH EXTREME PRECUATION - On tach - ENT following along (please see above for current trach management plan)   Tracheostomy in place [facility]) 02/2017    Tobacco History: Social History   Tobacco Use  Smoking Status Former   Packs/day: 0.50   Years: 25.00   Additional pack years: 0.00   Total pack years: 12.50   Types: Cigarettes   Quit date: 01/21/2016   Years since quitting: 7.0  Smokeless Tobacco Never   Counseling given: Not Answered   Outpatient Medications Prior to Visit  Medication Sig Dispense Refill   albuterol (PROVENTIL) (2.5 MG/3ML) 0.083% nebulizer solution Take 3 mLs (2.5 mg total)  by nebulization every 6 (six) hours as needed for wheezing or shortness of breath. 75 mL 12   amitriptyline (ELAVIL) 10 MG tablet TAKE 1 TABLET BY MOUTH AT BEDTIME (DISCONTINUE  TOPAMAX) 90 tablet 0   amLODipine (NORVASC) 5 MG tablet Take 1 tablet (5 mg total) by mouth daily. 180 tablet 3   Blood Glucose Monitoring Suppl (ONETOUCH VERIO FLEX SYSTEM) w/Device KIT Use to check glucose levels daily; E11.9 1 kit 0   buPROPion (WELLBUTRIN SR) 150 MG 12 hr tablet Take 1 tablet (150 mg total) by mouth 2 (two) times daily. 180 tablet 1   cetirizine  (ZYRTEC) 10 MG tablet TAKE 1 TABLET BY MOUTH AT BEDTIME (Patient taking differently: Take 10 mg by mouth at bedtime.) 30 tablet 0   Continuous Blood Gluc Receiver (FREESTYLE LIBRE 2 READER) DEVI USE TO CHECK BLOOD SUGAR THREE TIMES DAILY. 1 each 0   dapagliflozin propanediol (FARXIGA) 10 MG TABS tablet Take 1 tablet (10 mg total) by mouth daily before breakfast. 90 tablet 1   diclofenac Sodium (VOLTAREN) 1 % GEL Apply 4 g topically 4 (four) times daily. 100 g 1   glucose blood (ONETOUCH VERIO) test strip Use to check glucose levels daily; E11.9 100 each 6   hydrOXYzine (ATARAX) 25 MG tablet Take 1 tablet (25 mg total) by mouth 3 (three) times daily as needed. for anxiety 180 tablet 1   insulin degludec (TRESIBA FLEXTOUCH) 200 UNIT/ML FlexTouch Pen Inject 170 Units into the skin daily. 54 mL 3   insulin lispro (HUMALOG KWIKPEN) 100 UNIT/ML KwikPen Inject 10 Units into the skin 3 (three) times daily. 15 mL 6   Insulin Pen Needle 31G X 8 MM MISC Use to inject [place] and Humalog. Pt may use 4 pen needles daily. 100 each 11   lisinopril (ZESTRIL) 20 MG tablet Take 1 tablet (20 mg total) by mouth daily. 90 tablet 1   melatonin 3 MG TABS tablet Take 1 tablet (3 mg total) by mouth at bedtime. 30 tablet 3   Misc. Devices MISC Nebulizer device. Diagnosis - Asthma 1 each 0   Misc. Devices MISC Shower chair 1 each 0   mometasone-formoterol (DULERA) 200-5 MCG/ACT AERO Inhale 2 puffs into the lungs in the morning and at bedtime. 1 each 6   montelukast (SINGULAIR) 10 MG tablet Take 1 tablet (10 mg total) by mouth at bedtime. 90 tablet 1   ondansetron (ZOFRAN) 4 MG tablet TAKE 1 TABLET BY MOUTH EVERY 8 HOURS AS NEEDED FOR NAUSEA FOR VOMITING 30 tablet 0   OneTouch Delica Lancets 33G MISC Use to check glucose levels daily; E11.9 100 each 3   oxyCODONE-acetaminophen (PERCOCET) 10-325 MG tablet Take 1 tablet by mouth 5 (five) times daily as needed.     pantoprazole (PROTONIX) 40 MG tablet Take 1 tablet (40 mg total)  by mouth 2 (two) times daily. 180 tablet 1   pregabalin (LYRICA) 50 MG capsule Take 1 capsule (50 mg total) by mouth in the morning, at noon, and at bedtime. 90 capsule 3   rosuvastatin (CRESTOR) 20 MG tablet Take 1 tablet by mouth once daily 90 tablet 0   sertraline (ZOLOFT) 100 MG tablet Take 1 tablet (100 mg total) by mouth at bedtime. 90 tablet 1   spironolactone (ALDACTONE) 25 MG tablet Take 1 tablet (25 mg total) by mouth daily. 90 tablet 2   tirzepatide (MOUNJARO) 10 MG/0.5ML Pen Inject 10 mg into the skin once a week. 6 mL 6   tirzepatide [facility])  5 MG/0.5ML Pen Inject 5 mg into the skin once a week. For 4 weeks then increase to 7.5 mg once a week 6 mL 0   tirzepatide (MOUNJARO) 7.5 MG/0.5ML Pen Inject 7.5 mg into the skin once a week. For 4 weeks then increase to 10 mg once a week 6 mL 0   tiZANidine (ZANAFLEX) 4 MG tablet TAKE 1 TABLET BY MOUTH EVERY 8 HOURS AS NEEDED FOR MUSCLE SPASM 90 tablet 0   Vitamin D, Ergocalciferol, (DRISDOL) 1.25 MG (50000 UNIT) CAPS capsule Take 1 capsule (50,000 Units total) by mouth every 7 (seven) days. 5 capsule 0   albuterol (VENTOLIN HFA) 108 (90 Base) MCG/ACT inhaler INHALE 2 PUFFS INTO THE LUNGS EVERY 6 HOURS AS NEEDED FOR WHEEZING OR SHORTNESS OF BREATH 18 g 1   Continuous Glucose Sensor (FREESTYLE LIBRE 2 SENSOR) MISC USE TO CHECK BLOOD GLUCOSE THREE TIMES DAILY.  CHANGE SENSOR ONCE EVERY 14 DAYS. 2 each 0   furosemide (LASIX) 40 MG tablet Take 1 tablet (40 mg total) by mouth daily. 90 tablet 2   amoxicillin-clavulanate (AUGMENTIN) 875-125 MG tablet Take 1 tablet by mouth every 12 (twelve) hours. (Patient not taking: Reported on 01/28/2023)     benzonatate (TESSALON) 200 MG capsule Take 1 capsule (200 mg total) by mouth 2 (two) times daily as needed for cough. (Patient not taking: Reported on 01/28/2023) 20 capsule 0   chlorpheniramine-HYDROcodone (TUSSIONEX) 10-8 MG/5ML Take 5 mLs by mouth 2 (two) times daily. (Patient not taking: Reported on 01/28/2023)  473 mL 0   fluconazole (DIFLUCAN) 150 MG tablet TAKE 1 TABLET BY MOUTH AS A ONE TIME DOSE. (Patient not taking: Reported on 01/28/2023) 1 tablet 0   No facility-administered medications prior to visit.   Review of Systems  Review of Systems  Constitutional: Negative.   HENT: Negative.    Respiratory:  Positive for cough, shortness of breath and wheezing.     Physical Exam  BP 126/76 (BP Location: Left Arm, Patient Position: Sitting)   Pulse (!) 112   Temp 99.9 F (37.7 C) (Oral)   Ht 5\' 1"  (1.549 m)   Wt 260 lb (117.9 kg)   LMP  (LMP Unknown)   SpO2 95%   BMI 49.13 kg/m  Physical Exam Constitutional:      General: She is not in acute distress.    Appearance: Normal appearance. She is not ill-appearing.  HENT:     Head: Normocephalic and atraumatic.  Cardiovascular:     Rate and Rhythm: Normal rate and regular rhythm.  Pulmonary:     Effort: Pulmonary effort is normal.     Breath sounds: Normal breath sounds.  Musculoskeletal:        General: Normal range of motion.  Skin:    General: Skin is warm and dry.  Neurological:     General: No focal deficit present.     Mental Status: She is alert and oriented to person, place, and time. Mental status is at baseline.  Psychiatric:        Mood and Affect: Mood normal.        Behavior: Behavior normal.        Thought Content: Thought content normal.        Judgment: Judgment normal.      Lab Results:  CBC    Component Value Date/Time   WBC 10.0 01/28/2023 1533   RBC 4.59 01/28/2023 1533   HGB 13.0 01/28/2023 1533   HGB 11.9 04/12/2018 1035   HCT  40.1 01/28/2023 1533   HCT 34.8 04/12/2018 1035   PLT 366.0 01/28/2023 1533   PLT 373 04/12/2018 1035   MCV 87.4 01/28/2023 1533   MCV 84 04/12/2018 1035   MCH 28.4 08/19/2022 0645   MCHC 32.5 01/28/2023 1533   RDW 15.2 01/28/2023 1533   RDW 14.6 04/12/2018 1035   LYMPHSABS 4.4 (H) 01/28/2023 1533   LYMPHSABS 4.0 (H) 04/12/2018 1035   MONOABS 0.6 01/28/2023 1533    EOSABS 0.1 01/28/2023 1533   EOSABS 0.0 04/12/2018 1035   BASOSABS 0.1 01/28/2023 1533   BASOSABS 0.0 04/12/2018 1035    BMET    Component Value Date/Time   NA 143 02/06/2023 0751   K 4.3 02/06/2023 0751   CL 103 02/06/2023 0751   CO2 25 02/06/2023 0751   GLUCOSE 116 (H) 02/06/2023 0751   GLUCOSE 181 (H) 01/28/2023 1533   BUN 20 02/06/2023 0751   CREATININE 1.51 (H) 02/06/2023 0751   CREATININE 0.93 08/01/2015 1202   CALCIUM 9.7 02/06/2023 0751   GFRNONAA 56 (L) 08/19/2022 0645   GFRAA 68 04/10/2020 1634    BNP    Component Value Date/Time   BNP 3.1 08/18/2022 0954    ProBNP    Component Value Date/Time   PROBNP 9.0 01/28/2023 1533    Imaging: DG Chest 2 View  Result Date: 01/28/2023 CLINICAL DATA:  Shortness of breath and cough. EXAM: CHEST - 2 VIEW COMPARISON:  08/18/2022 FINDINGS: Tracheostomy stable. Heart size is normal. Lungs are clear. No pulmonary edema. Surgical clips are present in the UPPER abdomen. IMPRESSION: No active cardiopulmonary disease. Electronically Signed   By: Norva Pavlov M.D.   On: 01/28/2023 15:38   XR Lumbar Spine 2-3 Views  Result Date: 01/27/2023 AP lateral radiographs lumbar spine reviewed.  Lumbar fusion hardware in good position alignment with no complicating features.  No new fracture.  No spondylolisthesis.  No scoliosis.  MM 3D SCREENING MAMMOGRAM BILATERAL BREAST  Result Date: 01/26/2023 CLINICAL DATA:  Screening. EXAM: DIGITAL SCREENING BILATERAL MAMMOGRAM WITH TOMOSYNTHESIS AND CAD TECHNIQUE: Bilateral screening digital craniocaudal and mediolateral oblique mammograms were obtained. Bilateral screening digital breast tomosynthesis was performed. The images were evaluated with computer-aided detection. COMPARISON:  Previous exam(s). ACR Breast Density Category b: There are scattered areas of fibroglandular density. FINDINGS: There are no findings suspicious for malignancy. IMPRESSION: No mammographic evidence of malignancy. A result  letter of this screening mammogram will be mailed directly to the patient. RECOMMENDATION: Screening mammogram in one year. (Code:[id number]-B-01Y) BI-RADS CATEGORY  1: Negative. Electronically Signed   By: Elberta Fortis M.D.   On: 01/26/2023 16:41     Assessment & Plan:   Severe persistent asthma without complication - Worsening shortness of breath, wheezing and coughing symptoms. Treating for suspected asthma exacerbation. CXR clear, sending in prednisone taper - Continue Dulera two puffs morning and evening - Take Robitussin every 4 hours for the next several days to loosen congestion - Use albuterol nebulizer every 6 hours for shortness of breath/wheezing - Take Tylenol 650 mg every 6 hours as needed for fever/chills   Orders - Chest x-ray and lab work today - Sputum culture   Follow-up - 7 to 10 days with Waynetta Sandy NP or sooner if needed   Awaiting labs for BNP, if elevated needs to adjust diuretics with cardiology   Glenford Bayley, NP 02/09/2023

## 2023-01-28 NOTE — Patient Instructions (Addendum)
Recommendations - Continue Dulera 2 puffs morning and evening - Take Robitussin every 4 hours for the next several days to loosen congestion - Use albuterol nebulizer every 6 hours for shortness of breath/wheezing - Take Tylenol 650 mg every 6 hours as needed for fever/chills   Orders - Chest x-ray and lab work today - Sputum culture   Follow-up - 7 to 10 days with [facility] NP or sooner if needed

## 2023-01-29 ENCOUNTER — Ambulatory Visit: Payer: 59 | Attending: Cardiology

## 2023-01-29 DIAGNOSIS — Z79899 Other long term (current) drug therapy: Secondary | ICD-10-CM | POA: Diagnosis not present

## 2023-01-29 DIAGNOSIS — I10 Essential (primary) hypertension: Secondary | ICD-10-CM

## 2023-01-29 DIAGNOSIS — I5032 Chronic diastolic (congestive) heart failure: Secondary | ICD-10-CM | POA: Diagnosis not present

## 2023-01-29 DIAGNOSIS — I5033 Acute on chronic diastolic (congestive) heart failure: Secondary | ICD-10-CM | POA: Diagnosis not present

## 2023-01-29 DIAGNOSIS — M5416 Radiculopathy, lumbar region: Secondary | ICD-10-CM | POA: Diagnosis not present

## 2023-01-29 LAB — BASIC METABOLIC PANEL
BUN/Creatinine Ratio: 15 (ref 9–23)
BUN: 24 mg/dL — ABNORMAL HIGH (ref 6–23)
BUN: 27 mg/dL — ABNORMAL HIGH (ref 6–24)
CO2: 28 mEq/L (ref 19–32)
CO2: 25 mmol/L (ref 20–29)
Calcium: 9.7 mg/dL (ref 8.4–10.5)
Calcium: 9.8 mg/dL (ref 8.7–10.2)
Chloride: 102 mEq/L (ref 96–112)
Chloride: 96 mmol/L (ref 96–106)
Creatinine, Ser: 1.71 mg/dL — ABNORMAL HIGH (ref 0.40–1.20)
Creatinine, Ser: 1.82 mg/dL — ABNORMAL HIGH (ref 0.57–1.00)
GFR: 33.82 mL/min — ABNORMAL LOW (ref >60.00)
Glucose, Bld: 181 mg/dL — ABNORMAL HIGH (ref 70–99)
Glucose: 156 mg/dL — ABNORMAL HIGH (ref 70–99)
Potassium: 4.1 mEq/L (ref 3.5–5.1)
Potassium: 4.3 mmol/L (ref 3.5–5.2)
Sodium: 140 mEq/L (ref 135–145)
Sodium: 137 mmol/L (ref 134–144)
eGFR: 33 mL/min/{1.73_m2} — ABNORMAL LOW (ref >59)

## 2023-01-29 LAB — CBC WITH DIFFERENTIAL/PLATELET
Basophils Absolute: 0.1 10*3/uL (ref 0.0–0.1)
Basophils Relative: 0.8 % (ref 0.0–3.0)
Eosinophils Absolute: 0.1 10*3/uL (ref 0.0–0.7)
Eosinophils Relative: 0.9 % (ref 0.0–5.0)
HCT: 40.1 % (ref 36.0–46.0)
Hemoglobin: 13 g/dL (ref 12.0–15.0)
Lymphocytes Relative: 43.6 % (ref 12.0–46.0)
Lymphs Abs: 4.4 10*3/uL — ABNORMAL HIGH (ref 0.7–4.0)
MCHC: 32.5 g/dL (ref 30.0–36.0)
MCV: 87.4 fl (ref 78.0–100.0)
Monocytes Absolute: 0.6 10*3/uL (ref 0.1–1.0)
Monocytes Relative: 6.4 % (ref 3.0–12.0)
Neutro Abs: 4.8 10*3/uL (ref 1.4–7.7)
Neutrophils Relative %: 48.3 % (ref 43.0–77.0)
Platelets: 366 10*3/uL (ref 150.0–400.0)
RBC: 4.59 Mil/uL (ref 3.87–5.11)
RDW: 15.2 % (ref 11.5–15.5)
WBC: 10 10*3/uL (ref 4.0–10.5)

## 2023-01-29 LAB — BRAIN NATRIURETIC PEPTIDE: Pro B Natriuretic peptide (BNP): 9 pg/mL (ref 0.0–100.0)

## 2023-01-29 MED ORDER — PREDNISONE 10 MG PO TABS: ORAL_TABLET | ORAL | 0 refills | Status: DC

## 2023-01-29 NOTE — Progress Notes (Signed)
CXR was normal, no pneumonia; labs still pending. I will send prednisone for asthma flare. If this does not help or sputum production worsen we can consider abx. I will be out of office this coming week, if labs abnormal and she sees on mychart have her notify triage

## 2023-02-02 ENCOUNTER — Telehealth: Payer: Self-pay | Admitting: Family Medicine

## 2023-02-02 ENCOUNTER — Telehealth: Payer: Self-pay | Admitting: *Deleted

## 2023-02-02 DIAGNOSIS — Z79899 Other long term (current) drug therapy: Secondary | ICD-10-CM

## 2023-02-02 DIAGNOSIS — I5033 Acute on chronic diastolic (congestive) heart failure: Secondary | ICD-10-CM

## 2023-02-02 DIAGNOSIS — I5032 Chronic diastolic (congestive) heart failure: Secondary | ICD-10-CM

## 2023-02-02 DIAGNOSIS — R7989 Other specified abnormal findings of blood chemistry: Secondary | ICD-10-CM

## 2023-02-02 MED ORDER — FUROSEMIDE 20 MG PO TABS
20.0000 mg | ORAL_TABLET | Freq: Every day | ORAL | 0 refills | Status: DC
Start: 2023-02-02 — End: 2023-03-11

## 2023-02-02 NOTE — Telephone Encounter (Signed)
The patient has been notified of the result and verbalized understanding.  All questions (if any) were answered.  Pt aware to HOLD her lasix and spironolactone x 2 days, then after the 2 days she will restart her lasix but at a reduced dose of lasix 20 mg po daily and continue to hold the spironolactone, and come in for repeat bmet on this Friday 02/06/23.  Confirmed the pharmacy of choice with the pt.   Scheduled her repeat BMET on this Friday 02/06/23.   Pt verbalized understanding and agrees with this plan.

## 2023-02-02 NOTE — Telephone Encounter (Signed)
Contacted [name] to schedule their annual wellness visit. Appointment made for 02/06/2023.  Thank you,  [facility] Direct dial  [phone]

## 2023-02-02 NOTE — Telephone Encounter (Signed)
-----   Message from Meriam Sprague, MD sent at 01/30/2023  2:19 PM EDT ----- Cr rose. Looks like we dried her out. Would hold lasix x2 days and spironolactone. After holding the lasix for 2 days, can start back on 20mg  daily and continue to hold the spironolactone. Will repeat BMET next week for monitoring.

## 2023-02-03 ENCOUNTER — Other Ambulatory Visit: Payer: Self-pay | Admitting: Family Medicine

## 2023-02-03 DIAGNOSIS — J45909 Unspecified asthma, uncomplicated: Secondary | ICD-10-CM | POA: Diagnosis not present

## 2023-02-03 DIAGNOSIS — J449 Chronic obstructive pulmonary disease, unspecified: Secondary | ICD-10-CM | POA: Diagnosis not present

## 2023-02-03 DIAGNOSIS — E119 Type 2 diabetes mellitus without complications: Secondary | ICD-10-CM

## 2023-02-03 DIAGNOSIS — G4733 Obstructive sleep apnea (adult) (pediatric): Secondary | ICD-10-CM | POA: Diagnosis not present

## 2023-02-03 DIAGNOSIS — J4540 Moderate persistent asthma, uncomplicated: Secondary | ICD-10-CM

## 2023-02-03 DIAGNOSIS — Z930 Tracheostomy status: Secondary | ICD-10-CM | POA: Diagnosis not present

## 2023-02-04 DIAGNOSIS — J449 Chronic obstructive pulmonary disease, unspecified: Secondary | ICD-10-CM | POA: Diagnosis not present

## 2023-02-04 DIAGNOSIS — G4733 Obstructive sleep apnea (adult) (pediatric): Secondary | ICD-10-CM | POA: Diagnosis not present

## 2023-02-04 DIAGNOSIS — J9610 Chronic respiratory failure, unspecified whether with hypoxia or hypercapnia: Secondary | ICD-10-CM | POA: Diagnosis not present

## 2023-02-04 DIAGNOSIS — J45909 Unspecified asthma, uncomplicated: Secondary | ICD-10-CM | POA: Diagnosis not present

## 2023-02-06 ENCOUNTER — Ambulatory Visit: Payer: 59 | Attending: Cardiology

## 2023-02-06 ENCOUNTER — Other Ambulatory Visit: Payer: Self-pay

## 2023-02-06 DIAGNOSIS — Z79899 Other long term (current) drug therapy: Secondary | ICD-10-CM | POA: Diagnosis not present

## 2023-02-06 DIAGNOSIS — I5032 Chronic diastolic (congestive) heart failure: Secondary | ICD-10-CM

## 2023-02-06 DIAGNOSIS — I5033 Acute on chronic diastolic (congestive) heart failure: Secondary | ICD-10-CM

## 2023-02-06 DIAGNOSIS — R7989 Other specified abnormal findings of blood chemistry: Secondary | ICD-10-CM | POA: Diagnosis not present

## 2023-02-07 LAB — BASIC METABOLIC PANEL WITH GFR
BUN/Creatinine Ratio: 13 (ref 9–23)
BUN: 20 mg/dL (ref 6–24)
CO2: 25 mmol/L (ref 20–29)
Calcium: 9.7 mg/dL (ref 8.7–10.2)
Chloride: 103 mmol/L (ref 96–106)
Creatinine, Ser: 1.51 mg/dL — ABNORMAL HIGH (ref 0.57–1.00)
Glucose: 116 mg/dL — ABNORMAL HIGH (ref 70–99)
Potassium: 4.3 mmol/L (ref 3.5–5.2)
Sodium: 143 mmol/L (ref 134–144)
eGFR: 41 mL/min/1.73 — ABNORMAL LOW

## 2023-02-09 NOTE — Assessment & Plan Note (Addendum)
-   Worsening shortness of breath, wheezing and coughing symptoms. Treating for suspected asthma exacerbation. CXR clear, sending in prednisone taper - Continue Dulera two puffs morning and evening - Take Robitussin every 4 hours for the next several days to loosen congestion - Use albuterol nebulizer every 6 hours for shortness of breath/wheezing - Take Tylenol 650 mg every 6 hours as needed for fever/chills   Orders - Chest x-ray and lab work today - Sputum culture   Follow-up - 7 to 10 days with [facility] NP or sooner if needed

## 2023-02-10 ENCOUNTER — Ambulatory Visit (INDEPENDENT_AMBULATORY_CARE_PROVIDER_SITE_OTHER): Payer: 59 | Admitting: Primary Care

## 2023-02-10 ENCOUNTER — Other Ambulatory Visit: Payer: 59

## 2023-02-10 ENCOUNTER — Encounter: Payer: Self-pay | Admitting: Primary Care

## 2023-02-10 ENCOUNTER — Other Ambulatory Visit: Payer: Self-pay

## 2023-02-10 VITALS — BP 140/82 | HR 111 | Temp 99.0°F | Ht 61.0 in | Wt 266.6 lb

## 2023-02-10 DIAGNOSIS — R0602 Shortness of breath: Secondary | ICD-10-CM

## 2023-02-10 DIAGNOSIS — J4550 Severe persistent asthma, uncomplicated: Secondary | ICD-10-CM

## 2023-02-10 LAB — CBC
HCT: 37.5 % (ref 36.0–46.0)
Hemoglobin: 12 g/dL (ref 12.0–15.0)
MCHC: 32.1 g/dL (ref 30.0–36.0)
MCV: 86.8 fl (ref 78.0–100.0)
Platelets: 329 10*3/uL (ref 150.0–400.0)
RBC: 4.31 Mil/uL (ref 3.87–5.11)
RDW: 15.3 % (ref 11.5–15.5)
WBC: 9.5 10*3/uL (ref 4.0–10.5)

## 2023-02-10 LAB — D-DIMER, QUANTITATIVE: D-Dimer, Quant: 1.24 mcg/mL FEU — ABNORMAL HIGH (ref <0.50)

## 2023-02-10 NOTE — Assessment & Plan Note (Signed)
-   Does not appear exacerbated today. She has no wheezing on exam. O2 100% RA - Continue Dulera 200mg  two puffs twice daily and Albuterol nebulizer every 6 hours prn sob/wheezing  - Continue robitussin every 4 hours to loosen congestion

## 2023-02-10 NOTE — Assessment & Plan Note (Addendum)
-   Exact etiology is unclear. She reports persistent dyspnea and fatigue with activity. She was treated for possible asthma exacerbation during her last visit. She saw no clinical benefit after completing prednisone taper. She denies active  wheezing, increased mucus production or sputum purulence. CXR 01/28/23 showed clear lungs; no leukocytosis on labs. BNP was normal. Kidney function elevated, lasix was held. She reports increased LE swelling and bloating. Creatinine improved. Advised she resume lasix 20mg  daily and contact Dr. Shari Prows. She is scheduled for an echocardiogram on 5/30. No indication for further steroids or abx. She was able to provide a sputum sample today. We will check d-dimer, if elevated will get CTA to rule out PE. Advised she reach out to trach clinic for follow-up.

## 2023-02-10 NOTE — Progress Notes (Signed)
@Patient  ID: [name], female    DOB: 1971/03/29, 52 y.o.   MRN: [id number]  Chief Complaint  Patient presents with   Follow-up    7-10 day f/u.  Patient states doing well.  Did give sputum culture to lab today    Referring provider: Hoy Register, MD  HPI: 52 year old female, former smoker.  Past medical history significant for hypertension, chronic diastolic heart failure, OSA, chronic respiratory failure with hypoxia, severe persistent asthma, type 1 diabetes.  Previous [facility] pulmonary encounter:  01/28/2023 Patient presents today for overdue follow-up. She reports worsening shortness of breath, wheezing and cough for the last month. Cough is worse at night. Cough can be productive without purulence. She sleeps with 6 pillows. She is compliant with Dulera. Using nebulizer daily. Taking lasix 40mg  daily. Weight is up some along with leg swelling.   She saw cardiology on 01/22/23. Lasix was increased to 40mg  daily and she was started on spironolactone. She has been ordered for echocardiogram.   Severe persistent asthma without complication - Worsening shortness of breath, wheezing and coughing symptoms. Treating for suspected asthma exacerbation. CXR clear, sending in prednisone taper - Continue Dulera two puffs morning and evening - Take Robitussin every 4 hours for the next several days to loosen congestion - Use albuterol nebulizer every 6 hours for shortness of breath/wheezing - Take Tylenol 650 mg every 6 hours as needed for fever/chills    Orders - Chest x-ray and lab work today - Sputum culture     02/10/2023- Interim hx  Patient presents today for 7-10 day follow-up shortness of breath/asthma exacerbation  Dyspnea symptoms have been going on for some time  She gets short winded with activity and get tired quickly. It takes a lot out of her do most ADLs  Finished prednisone taper but saw no clinical benefit  Not currently having any wheezing  symptoms  She provided a sputum today, no change in amount of mucus production or color  She is taking robitussin as directed to loosen congestion  She follows with trach clinic in [place], she will be seeing them in July  She has an echocardiogram scheduled for May 30th  Lasix was held due to kidney function, she reports increased bloting and leg swelling. She had BMET on 5/17 and creatinine was improved.    Allergies  Allergen Reactions   Reglan [Metoclopramide] Other (See Comments)    Panic attack    Immunization History  Administered Date(s) Administered   Influenza,inj,Quad PF,6+ Mos 06/05/2014, 08/01/2015, 07/02/2016, 06/19/2017, 06/20/2021, 07/09/2022   PFIZER(Purple Top)SARS-COV-2 Vaccination 08/29/2020   Pneumococcal Polysaccharide-23 04/29/2013, 02/21/2017   Zoster Recombinat (Shingrix) 10/09/2022    Past Medical History:  Diagnosis Date   Abnormal UGI series    Acute pain of right shoulder due to trauma 10/05/2018   Acute respiratory failure with hypoxia and hypercapnia (HCC) 10/23/2018   Allergy    Anemia    Anxiety    Arthritis    Asthma    Chest pain of uncertain etiology 03/22/2019   Chronic back pain    Chronic chest pain    resolved, no problems since 2019 per patient 07/27/19   Coffee ground vomiting 10/14/2021   COPD (chronic obstructive pulmonary disease) (HCC)    inhaler   Depression    DM (diabetes mellitus) (HCC)    INSULIN DEPENDENT - TYPE 1   Dyspnea    GERD (gastroesophageal reflux disease)    Headache(784.0)    History of hiatal hernia  Hyperlipidemia    no med, diet controlled   Hypertension    Hypokalemia    Klebsiella cystitis 03/14/2017   Last Assessment & Plan:   Formatting of this note might be different from the original.  - On Zosyn  - Urine culture: Klebsiella pneumoniae   Osteoarthritis    Pneumonia    Respiratory disease 05/2017   uses inhalers, neb tx prn, no oxygen   Rotator cuff tear 07/28/2019   Sleep apnea     does not use CPAP due to trach, uses 2L supplement oxygen at night   Status post tracheostomy (HCC) 03/14/2017   Last Assessment & Plan:  Formatting of this note might be different from the original. - VERY CRITICAL AIRWAY WITH EXTREME PRECUATION - On tach - ENT following along (please see above for current trach management plan)   Tracheostomy in place [facility]) 02/2017    Tobacco History: Social History   Tobacco Use  Smoking Status Former   Packs/day: 0.50   Years: 25.00   Additional pack years: 0.00   Total pack years: 12.50   Types: Cigarettes   Quit date: 01/21/2016   Years since quitting: 7.0  Smokeless Tobacco Never   Counseling given: Not Answered   Outpatient Medications Prior to Visit  Medication Sig Dispense Refill   albuterol (PROVENTIL) (2.5 MG/3ML) 0.083% nebulizer solution Take 3 mLs (2.5 mg total) by nebulization every 6 (six) hours as needed for wheezing or shortness of breath. 75 mL 12   albuterol (VENTOLIN HFA) 108 (90 Base) MCG/ACT inhaler INHALE 2 PUFFS BY MOUTH EVERY 6 HOURS AS NEEDED FOR WHEEZING FOR SHORTNESS OF BREATH 18 g 0   amitriptyline (ELAVIL) 10 MG tablet TAKE 1 TABLET BY MOUTH AT BEDTIME (DISCONTINUE  TOPAMAX) 90 tablet 0   amLODipine (NORVASC) 5 MG tablet Take 1 tablet (5 mg total) by mouth daily. 180 tablet 3   Blood Glucose Monitoring Suppl (ONETOUCH VERIO FLEX SYSTEM) w/Device KIT Use to check glucose levels daily; E11.9 1 kit 0   buPROPion (WELLBUTRIN SR) 150 MG 12 hr tablet Take 1 tablet (150 mg total) by mouth 2 (two) times daily. 180 tablet 1   cetirizine (ZYRTEC) 10 MG tablet TAKE 1 TABLET BY MOUTH AT BEDTIME (Patient taking differently: Take 10 mg by mouth at bedtime.) 30 tablet 0   chlorpheniramine-HYDROcodone (TUSSIONEX) 10-8 MG/5ML Take 5 mLs by mouth 2 (two) times daily. 473 mL 0   Continuous Blood Gluc Receiver (FREESTYLE LIBRE 2 READER) DEVI USE TO CHECK BLOOD SUGAR THREE TIMES DAILY. 1 each 0   Continuous Glucose Sensor (FREESTYLE LIBRE 2  SENSOR) MISC USE TO CHECK BLOOD GLUCOSE THREE TIMES DAILY.  CHANGE SENSOR ONCE EVERY 14 DAYS. 2 each 0   dapagliflozin propanediol (FARXIGA) 10 MG TABS tablet Take 1 tablet (10 mg total) by mouth daily before breakfast. 90 tablet 1   diclofenac Sodium (VOLTAREN) 1 % GEL Apply 4 g topically 4 (four) times daily. 100 g 1   fluconazole (DIFLUCAN) 150 MG tablet TAKE 1 TABLET BY MOUTH AS A ONE TIME DOSE. 1 tablet 0   furosemide (LASIX) 20 MG tablet Take 1 tablet (20 mg total) by mouth daily. 90 tablet 0   glucose blood (ONETOUCH VERIO) test strip Use to check glucose levels daily; E11.9 100 each 6   hydrOXYzine (ATARAX) 25 MG tablet Take 1 tablet (25 mg total) by mouth 3 (three) times daily as needed. for anxiety 180 tablet 1   insulin degludec (TRESIBA FLEXTOUCH) 200 UNIT/ML FlexTouch  Pen Inject 170 Units into the skin daily. 54 mL 3   insulin lispro (HUMALOG KWIKPEN) 100 UNIT/ML KwikPen Inject 10 Units into the skin 3 (three) times daily. 15 mL 6   Insulin Pen Needle 31G X 8 MM MISC Use to inject [place] and Humalog. Pt may use 4 pen needles daily. 100 each 11   lisinopril (ZESTRIL) 20 MG tablet Take 1 tablet (20 mg total) by mouth daily. 90 tablet 1   melatonin 3 MG TABS tablet Take 1 tablet (3 mg total) by mouth at bedtime. 30 tablet 3   Misc. Devices MISC Nebulizer device. Diagnosis - Asthma 1 each 0   Misc. Devices MISC Shower chair 1 each 0   mometasone-formoterol (DULERA) 200-5 MCG/ACT AERO Inhale 2 puffs into the lungs in the morning and at bedtime. 1 each 6   montelukast (SINGULAIR) 10 MG tablet Take 1 tablet (10 mg total) by mouth at bedtime. 90 tablet 1   ondansetron (ZOFRAN) 4 MG tablet TAKE 1 TABLET BY MOUTH EVERY 8 HOURS AS NEEDED FOR NAUSEA FOR VOMITING 30 tablet 0   OneTouch Delica Lancets 33G MISC Use to check glucose levels daily; E11.9 100 each 3   oxyCODONE-acetaminophen (PERCOCET) 10-325 MG tablet Take 1 tablet by mouth 5 (five) times daily as needed.     pantoprazole (PROTONIX)  40 MG tablet Take 1 tablet (40 mg total) by mouth 2 (two) times daily. 180 tablet 1   pregabalin (LYRICA) 50 MG capsule Take 1 capsule (50 mg total) by mouth in the morning, at noon, and at bedtime. 90 capsule 3   rosuvastatin (CRESTOR) 20 MG tablet Take 1 tablet by mouth once daily 90 tablet 0   sertraline (ZOLOFT) 100 MG tablet Take 1 tablet (100 mg total) by mouth at bedtime. 90 tablet 1   spironolactone (ALDACTONE) 25 MG tablet Take 1 tablet (25 mg total) by mouth daily. 90 tablet 2   tirzepatide (MOUNJARO) 10 MG/0.5ML Pen Inject 10 mg into the skin once a week. 6 mL 6   tirzepatide (MOUNJARO) 5 MG/0.5ML Pen Inject 5 mg into the skin once a week. For 4 weeks then increase to 7.5 mg once a week 6 mL 0   tirzepatide (MOUNJARO) 7.5 MG/0.5ML Pen Inject 7.5 mg into the skin once a week. For 4 weeks then increase to 10 mg once a week 6 mL 0   tiZANidine (ZANAFLEX) 4 MG tablet TAKE 1 TABLET BY MOUTH EVERY 8 HOURS AS NEEDED FOR MUSCLE SPASM 90 tablet 0   Vitamin D, Ergocalciferol, (DRISDOL) 1.25 MG (50000 UNIT) CAPS capsule Take 1 capsule (50,000 Units total) by mouth every 7 (seven) days. 5 capsule 0   amoxicillin-clavulanate (AUGMENTIN) 875-125 MG tablet Take 1 tablet by mouth every 12 (twelve) hours. (Patient not taking: Reported on 02/10/2023)     benzonatate (TESSALON) 200 MG capsule Take 1 capsule (200 mg total) by mouth 2 (two) times daily as needed for cough. (Patient not taking: Reported on 02/10/2023) 20 capsule 0   predniSONE (DELTASONE) 10 MG tablet 4 tabs for 2 days, then 3 tabs for 2 days, 2 tabs for 2 days, then 1 tab for 2 days, then stop (Patient not taking: Reported on 02/10/2023) 20 tablet 0   No facility-administered medications prior to visit.   Review of Systems  Review of Systems  Constitutional:  Positive for fatigue.  HENT: Negative.    Respiratory:  Positive for shortness of breath. Negative for chest tightness and wheezing.   Cardiovascular:  Positive for leg swelling.    Physical Exam  BP (!) 140/82 (BP Location: Right Arm, Patient Position: Sitting, Cuff Size: Large)   Pulse (!) 111   Temp 99 F (37.2 C)   Ht 5\' 1"  (1.549 m)   Wt 266 lb 9.6 oz (120.9 kg)   LMP  (LMP Unknown)   SpO2 100%   BMI 50.37 kg/m  Physical Exam Constitutional:      Appearance: Normal appearance.  HENT:     Head: Normocephalic and atraumatic.     Mouth/Throat:     Mouth: Mucous membranes are moist.     Pharynx: Oropharynx is clear.  Cardiovascular:     Rate and Rhythm: Regular rhythm. Tachycardia present.  Pulmonary:     Effort: Pulmonary effort is normal.     Breath sounds: No wheezing or rales.     Comments: Trach in place  Musculoskeletal:        General: Normal range of motion.  Skin:    General: Skin is warm and dry.  Neurological:     General: No focal deficit present.     Mental Status: She is alert and oriented to person, place, and time. Mental status is at baseline.  Psychiatric:        Mood and Affect: Mood normal.        Behavior: Behavior normal.        Thought Content: Thought content normal.        Judgment: Judgment normal.      Lab Results:  CBC    Component Value Date/Time   WBC 9.5 02/10/2023 1500   RBC 4.31 02/10/2023 1500   HGB 12.0 02/10/2023 1500   HGB 11.9 04/12/2018 1035   HCT 37.5 02/10/2023 1500   HCT 34.8 04/12/2018 1035   PLT 329.0 02/10/2023 1500   PLT 373 04/12/2018 1035   MCV 86.8 02/10/2023 1500   MCV 84 04/12/2018 1035   MCH 28.4 08/19/2022 0645   MCHC 32.1 02/10/2023 1500   RDW 15.3 02/10/2023 1500   RDW 14.6 04/12/2018 1035   LYMPHSABS 4.4 (H) 01/28/2023 1533   LYMPHSABS 4.0 (H) 04/12/2018 1035   MONOABS 0.6 01/28/2023 1533   EOSABS 0.1 01/28/2023 1533   EOSABS 0.0 04/12/2018 1035   BASOSABS 0.1 01/28/2023 1533   BASOSABS 0.0 04/12/2018 1035    BMET    Component Value Date/Time   NA 143 02/06/2023 0751   K 4.3 02/06/2023 0751   CL 103 02/06/2023 0751   CO2 25 02/06/2023 0751   GLUCOSE 116 (H)  02/06/2023 0751   GLUCOSE 181 (H) 01/28/2023 1533   BUN 20 02/06/2023 0751   CREATININE 1.51 (H) 02/06/2023 0751   CREATININE 0.93 08/01/2015 1202   CALCIUM 9.7 02/06/2023 0751   GFRNONAA 56 (L) 08/19/2022 0645   GFRAA 68 04/10/2020 1634    BNP    Component Value Date/Time   BNP 3.1 08/18/2022 0954    ProBNP    Component Value Date/Time   PROBNP 9.0 01/28/2023 1533    Imaging: DG Chest 2 View  Result Date: 01/28/2023 CLINICAL DATA:  Shortness of breath and cough. EXAM: CHEST - 2 VIEW COMPARISON:  08/18/2022 FINDINGS: Tracheostomy stable. Heart size is normal. Lungs are clear. No pulmonary edema. Surgical clips are present in the UPPER abdomen. IMPRESSION: No active cardiopulmonary disease. Electronically Signed   By: Norva Pavlov M.D.   On: 01/28/2023 15:38   XR Lumbar Spine 2-3 Views  Result Date: 01/27/2023 AP lateral radiographs lumbar  spine reviewed.  Lumbar fusion hardware in good position alignment with no complicating features.  No new fracture.  No spondylolisthesis.  No scoliosis.  MM 3D SCREENING MAMMOGRAM BILATERAL BREAST  Result Date: 01/26/2023 CLINICAL DATA:  Screening. EXAM: DIGITAL SCREENING BILATERAL MAMMOGRAM WITH TOMOSYNTHESIS AND CAD TECHNIQUE: Bilateral screening digital craniocaudal and mediolateral oblique mammograms were obtained. Bilateral screening digital breast tomosynthesis was performed. The images were evaluated with computer-aided detection. COMPARISON:  Previous exam(s). ACR Breast Density Category b: There are scattered areas of fibroglandular density. FINDINGS: There are no findings suspicious for malignancy. IMPRESSION: No mammographic evidence of malignancy. A result letter of this screening mammogram will be mailed directly to the patient. RECOMMENDATION: Screening mammogram in one year. (Code:SM-B-01Y) BI-RADS CATEGORY  1: Negative. Electronically Signed   By: Elberta Fortis M.D.   On: 01/26/2023 16:41     Assessment & Plan:   Dyspnea -  Exact etiology is unclear. She reports persistent dyspnea and fatigue with activity. She was treated for possible asthma exacerbation during her last visit. She saw no clinical benefit after completing prednisone taper. She denies active  wheezing, increased mucus production or sputum purulence. CXR 01/28/23 showed clear lungs; no leukocytosis on labs. BNP was normal. Kidney function elevated, lasix was held. She reports increased LE swelling and bloating. Creatinine improved. Advised she resume lasix 20mg  daily and contact Dr. Shari Prows. She is scheduled for an echocardiogram on 5/30. No indication for further steroids or abx. She was able to provide a sputum sample today. We will check d-dimer, if elevated will get CTA to rule out PE. Advised she reach out to trach clinic for follow-up.   Severe persistent asthma without complication - Does not appear exacerbated today. She has no wheezing on exam. O2 100% RA - Continue Dulera 200mg  two puffs twice daily and Albuterol nebulizer every 6 hours prn sob/wheezing  - Continue robitussin every 4 hours to loosen congestion    Glenford Bayley, NP 02/10/2023

## 2023-02-10 NOTE — Patient Instructions (Addendum)
Recommendations: - Resume lasix 20mg  daily - reach out to Dr. Shari Prows to let them know we advised you start back on your diuretic. Kidney function was better on most recent labs  - Continue Dulera 200mg  two puffs twice daily - Resume nebulizer every 6 hours  - Continue robitussin every 4 hours to loosen congestion  - Chest xray without evidence of bronchitis or pneumonia. CBC with normal white blood cell count. No indication of respiratory infection. No further steriods needs or antibiotics at this time. We will wait to see if sputum sample grows anything.  - Keep apt for echocardiogram on May 30th  - Reach out to trach clinic   Orders: D-dimer today  Follow-up: 4-8 weeks with Dr. Celine Mans or sooner if needed/ if respiratory symptoms worsen present to ED

## 2023-02-11 ENCOUNTER — Encounter (HOSPITAL_BASED_OUTPATIENT_CLINIC_OR_DEPARTMENT_OTHER): Payer: Self-pay

## 2023-02-11 ENCOUNTER — Ambulatory Visit (HOSPITAL_BASED_OUTPATIENT_CLINIC_OR_DEPARTMENT_OTHER)
Admission: RE | Admit: 2023-02-11 | Discharge: 2023-02-11 | Disposition: A | Discharge status: Home / Self Care | Payer: 59 | Source: Ambulatory Visit | Attending: Primary Care | Admitting: Primary Care

## 2023-02-11 DIAGNOSIS — E785 Hyperlipidemia, unspecified: Secondary | ICD-10-CM | POA: Diagnosis not present

## 2023-02-11 DIAGNOSIS — R7989 Other specified abnormal findings of blood chemistry: Secondary | ICD-10-CM | POA: Diagnosis not present

## 2023-02-11 DIAGNOSIS — R0602 Shortness of breath: Secondary | ICD-10-CM | POA: Insufficient documentation

## 2023-02-11 DIAGNOSIS — Z720 Tobacco use: Secondary | ICD-10-CM | POA: Diagnosis not present

## 2023-02-11 DIAGNOSIS — N179 Acute kidney failure, unspecified: Secondary | ICD-10-CM | POA: Diagnosis not present

## 2023-02-11 LAB — POCT I-STAT CREATININE: Creatinine, Ser: 1.5 mg/dL — ABNORMAL HIGH (ref 0.44–1.00)

## 2023-02-11 MED ORDER — IOHEXOL 350 MG/ML SOLN
100.0000 mL | Freq: Once | INTRAVENOUS | Status: AC | PRN
  Administered 2023-02-11: 75 mL via INTRAVENOUS

## 2023-02-11 NOTE — Progress Notes (Signed)
I called patient and left message. D-dimer is elevated, I ordered stat CTA

## 2023-02-11 NOTE — Addendum Note (Signed)
Addended by: Glenford Bayley on: 02/11/2023 08:40 AM   Modules accepted: Orders

## 2023-02-12 NOTE — Progress Notes (Signed)
Please let patient know CTA was negative for central PE, no acute process in the chest to explain shortness of breath.  Would have her follow-up with trs clinic and cardiology.

## 2023-02-13 ENCOUNTER — Telehealth: Payer: Self-pay | Admitting: Primary Care

## 2023-02-13 ENCOUNTER — Other Ambulatory Visit: Payer: Self-pay

## 2023-02-13 LAB — RESPIRATORY CULTURE OR RESPIRATORY AND SPUTUM CULTURE
MICRO NUMBER:: 14984567
SPECIMEN QUALITY:: ADEQUATE

## 2023-02-13 MED ORDER — LEVOFLOXACIN 500 MG PO TABS
500.0000 mg | ORAL_TABLET | Freq: Every day | ORAL | 0 refills | Status: DC
Start: 2023-02-13 — End: 2023-03-13

## 2023-02-13 NOTE — Telephone Encounter (Signed)
Called patient. Reviewed information.  Patient verbalized understanding and will pick up abt this evening.  Nothing further needed.

## 2023-02-13 NOTE — Telephone Encounter (Signed)
Please let patient know Sputum culture grew out staph. I will send in abx. Can you confirm she is not taking fluconazole and has she completed prednisone?

## 2023-02-17 NOTE — Progress Notes (Unsigned)
Subjective:   [name] is a 52 y.o. female who presents for Medicare Annual (Subsequent) preventive examination.  Review of Systems    ***       Objective:    There were no vitals filed for this visit. There is no height or weight on file to calculate BMI.     08/16/2022   12:24 AM 12/23/2021    9:42 AM 10/31/2021    6:22 PM 10/31/2021    2:06 PM 10/29/2021    8:50 PM 10/13/2021    2:39 PM 10/12/2021    4:17 PM  Advanced Directives  Does Patient Have a Medical Advance Directive? No No No No No No No  Would patient like information on creating a medical advance directive?    No - Patient declined  No - Patient declined No - Patient declined    Current Medications (verified) Outpatient Encounter Medications as of 02/18/2023  Medication Sig   albuterol (PROVENTIL) (2.5 MG/3ML) 0.083% nebulizer solution Take 3 mLs (2.5 mg total) by nebulization every 6 (six) hours as needed for wheezing or shortness of breath.   albuterol (VENTOLIN HFA) 108 (90 Base) MCG/ACT inhaler INHALE 2 PUFFS BY MOUTH EVERY 6 HOURS AS NEEDED FOR WHEEZING FOR SHORTNESS OF BREATH   amitriptyline (ELAVIL) 10 MG tablet TAKE 1 TABLET BY MOUTH AT BEDTIME (DISCONTINUE  TOPAMAX)   amLODipine (NORVASC) 5 MG tablet Take 1 tablet (5 mg total) by mouth daily.   amoxicillin-clavulanate (AUGMENTIN) 875-125 MG tablet Take 1 tablet by mouth every 12 (twelve) hours. (Patient not taking: Reported on 02/10/2023)   benzonatate (TESSALON) 200 MG capsule Take 1 capsule (200 mg total) by mouth 2 (two) times daily as needed for cough. (Patient not taking: Reported on 02/10/2023)   Blood Glucose Monitoring Suppl (ONETOUCH VERIO FLEX SYSTEM) w/Device KIT Use to check glucose levels daily; E11.9   buPROPion (WELLBUTRIN SR) 150 MG 12 hr tablet Take 1 tablet (150 mg total) by mouth 2 (two) times daily.   cetirizine (ZYRTEC) 10 MG tablet TAKE 1 TABLET BY MOUTH AT BEDTIME (Patient taking differently: Take 10 mg by mouth at  bedtime.)   chlorpheniramine-HYDROcodone (TUSSIONEX) 10-8 MG/5ML Take 5 mLs by mouth 2 (two) times daily.   Continuous Blood Gluc Receiver (FREESTYLE LIBRE 2 READER) DEVI USE TO CHECK BLOOD SUGAR THREE TIMES DAILY.   Continuous Glucose Sensor (FREESTYLE LIBRE 2 SENSOR) MISC USE TO CHECK BLOOD GLUCOSE THREE TIMES DAILY.  CHANGE SENSOR ONCE EVERY 14 DAYS.   dapagliflozin propanediol (FARXIGA) 10 MG TABS tablet Take 1 tablet (10 mg total) by mouth daily before breakfast.   diclofenac Sodium (VOLTAREN) 1 % GEL Apply 4 g topically 4 (four) times daily.   furosemide (LASIX) 20 MG tablet Take 1 tablet (20 mg total) by mouth daily.   glucose blood (ONETOUCH VERIO) test strip Use to check glucose levels daily; E11.9   hydrOXYzine (ATARAX) 25 MG tablet Take 1 tablet (25 mg total) by mouth 3 (three) times daily as needed. for anxiety   insulin degludec (TRESIBA FLEXTOUCH) 200 UNIT/ML FlexTouch Pen Inject 170 Units into the skin daily.   insulin lispro (HUMALOG KWIKPEN) 100 UNIT/ML KwikPen Inject 10 Units into the skin 3 (three) times daily.   Insulin Pen Needle 31G X 8 MM MISC Use to inject [place] and Humalog. Pt may use 4 pen needles daily.   levofloxacin (LEVAQUIN) 500 MG tablet Take 1 tablet (500 mg total) by mouth daily.   lisinopril (ZESTRIL) 20 MG tablet Take 1  tablet (20 mg total) by mouth daily.   melatonin 3 MG TABS tablet Take 1 tablet (3 mg total) by mouth at bedtime.   Misc. Devices MISC Nebulizer device. Diagnosis - Asthma   Misc. Devices MISC Shower chair   mometasone-formoterol (DULERA) 200-5 MCG/ACT AERO Inhale 2 puffs into the lungs in the morning and at bedtime.   montelukast (SINGULAIR) 10 MG tablet Take 1 tablet (10 mg total) by mouth at bedtime.   ondansetron (ZOFRAN) 4 MG tablet TAKE 1 TABLET BY MOUTH EVERY 8 HOURS AS NEEDED FOR NAUSEA FOR VOMITING   OneTouch Delica Lancets 33G MISC Use to check glucose levels daily; E11.9   oxyCODONE-acetaminophen (PERCOCET) 10-325 MG tablet Take 1  tablet by mouth 5 (five) times daily as needed.   pantoprazole (PROTONIX) 40 MG tablet Take 1 tablet (40 mg total) by mouth 2 (two) times daily.   pregabalin (LYRICA) 50 MG capsule Take 1 capsule (50 mg total) by mouth in the morning, at noon, and at bedtime.   rosuvastatin (CRESTOR) 20 MG tablet Take 1 tablet by mouth once daily   sertraline (ZOLOFT) 100 MG tablet Take 1 tablet (100 mg total) by mouth at bedtime.   spironolactone (ALDACTONE) 25 MG tablet Take 1 tablet (25 mg total) by mouth daily.   tirzepatide (MOUNJARO) 10 MG/0.5ML Pen Inject 10 mg into the skin once a week.   tirzepatide [facility]) 5 MG/0.5ML Pen Inject 5 mg into the skin once a week. For 4 weeks then increase to 7.5 mg once a week   tirzepatide (MOUNJARO) 7.5 MG/0.5ML Pen Inject 7.5 mg into the skin once a week. For 4 weeks then increase to 10 mg once a week   tiZANidine (ZANAFLEX) 4 MG tablet TAKE 1 TABLET BY MOUTH EVERY 8 HOURS AS NEEDED FOR MUSCLE SPASM   Vitamin D, Ergocalciferol, (DRISDOL) 1.25 MG (50000 UNIT) CAPS capsule Take 1 capsule (50,000 Units total) by mouth every 7 (seven) days.   [DISCONTINUED] fluticasone-salmeterol (ADVAIR HFA) 230-21 MCG/ACT inhaler Inhale 2 puffs into the lungs 2 (two) times daily.   No facility-administered encounter medications on file as of 02/18/2023.    Allergies (verified) Reglan [metoclopramide]   History: Past Medical History:  Diagnosis Date   Abnormal UGI series    Acute pain of right shoulder due to trauma 10/05/2018   Acute respiratory failure with hypoxia and hypercapnia (HCC) 10/23/2018   Allergy    Anemia    Anxiety    Arthritis    Asthma    Chest pain of uncertain etiology 03/22/2019   Chronic back pain    Chronic chest pain    resolved, no problems since 2019 per patient 07/27/19   Coffee ground vomiting 10/14/2021   COPD (chronic obstructive pulmonary disease) (HCC)    inhaler   Depression    DM (diabetes mellitus) (HCC)    INSULIN DEPENDENT - TYPE 1    Dyspnea    GERD (gastroesophageal reflux disease)    Headache(784.0)    History of hiatal hernia    Hyperlipidemia    no med, diet controlled   Hypertension    Hypokalemia    Klebsiella cystitis 03/14/2017   Last Assessment & Plan:   Formatting of this note might be different from the original.  - On Zosyn  - Urine culture: Klebsiella pneumoniae   Osteoarthritis    Pneumonia    Respiratory disease 05/2017   uses inhalers, neb tx prn, no oxygen   Rotator cuff tear 07/28/2019   Sleep apnea  does not use CPAP due to trach, uses 2L supplement oxygen at night   Status post tracheostomy (HCC) 03/14/2017   Last Assessment & Plan:  Formatting of this note might be different from the original. - VERY CRITICAL AIRWAY WITH EXTREME PRECUATION - On tach - ENT following along (please see above for current trach management plan)   Tracheostomy in place [facility]) 02/2017   Past Surgical History:  Procedure Laterality Date   ABDOMINAL HYSTERECTOMY  2009   APPENDECTOMY     BIOPSY  10/15/2021   Procedure: BIOPSY;  Surgeon: Tressia Danas, MD;  Location: [facility] ENDOSCOPY;  Service: Gastroenterology;;   CESAREAN SECTION     x 3   CHOLECYSTECTOMY N/A 03/02/2014   Procedure: LAPAROSCOPIC CHOLECYSTECTOMY;  Surgeon: Clovis Pu. Cornett, MD;  Location: [facility] OR;  Service: General;  Laterality: N/A;   ESOPHAGOGASTRODUODENOSCOPY (EGD) WITH PROPOFOL N/A 10/15/2021   Procedure: ESOPHAGOGASTRODUODENOSCOPY (EGD) WITH PROPOFOL;  Surgeon: Tressia Danas, MD;  Location: [facility] ENDOSCOPY;  Service: Gastroenterology;  Laterality: N/A;   HERNIA REPAIR     PANENDOSCOPY N/A 03/04/2017   Procedure: PANENDOSCOPY WITH POSSIBLE FOREIGN BODY REMOVAL;  Surgeon: Flo Shanks, MD;  Location: [facility] OR;  Service: ENT;  Laterality: N/A;   ROTATOR CUFF REPAIR Left    ROTATOR CUFF REPAIR Right 07/27/2019   SHOULDER ARTHROSCOPY WITH SUBACROMIAL DECOMPRESSION, ROTATOR CUFF REPAIR AND BICEP TENDON REPAIR Right 07/28/2019   Procedure: RIGHT  SHOULDER ARTHROSCOP, MINI OPEN ROTATOR CUFF TEAR REPAIR,  BICEPS TENODESIS, DISTAL CLAVICLE EXCISION;  Surgeon: Cammy Copa, MD;  Location: [facility] OR;  Service: Orthopedics;  Laterality: Right;   TOOTH EXTRACTION N/A 02/22/2021   Procedure: DENTAL RESTORATION/EXTRACTIONS;  Surgeon: Ocie Doyne, DMD;  Location: [facility] OR;  Service: Oral Surgery;  Laterality: N/A;   TRACHEOSTOMY  02/2017   TUBAL LIGATION     VESICOVAGINAL FISTULA CLOSURE W/ TAH  2009   Family History  Problem Relation Age of Onset   Heart attack Mother    Stroke Mother    Diabetes Mother    Hypertension Mother    Arthritis Mother    Stroke Father    Asthma Sister    Hypertension Sister    Asthma Sister    Diabetes Sister    Asthma Brother    Seizures Brother    Asthma Brother    Diabetes Brother    Asthma Daughter    Asthma Son    Asthma Grandson    Colon cancer Neg Hx    Rectal cancer Neg Hx    Stomach cancer Neg Hx    Esophageal cancer Neg Hx    Ovarian cancer Neg Hx    Pancreatic cancer Neg Hx    Social History   Socioeconomic History   Marital status: Married    Spouse name: Hydrologist   Number of children: Not on file   Years of education: Not on file   Highest education level: Not on file  Occupational History   Not on file  Tobacco Use   Smoking status: Former    Packs/day: 0.50    Years: 25.00    Additional pack years: 0.00    Total pack years: 12.50    Types: Cigarettes    Quit date: 01/21/2016    Years since quitting: 7.0   Smokeless tobacco: Never  Vaping Use   Vaping Use: Never used  Substance and Sexual Activity   Alcohol use: Yes    Comment: social wine   Drug use: Not Currently    Types: Marijuana  Comment: couple times a month, Last use was on   Sexual activity: Yes    Partners: Male    Birth control/protection: Surgical    Comment: Hysterectomy  Other Topics Concern   Not on file  Social History Narrative   Lives with husband   Social Determinants of Health    Financial Resource Strain: Medium Risk (06/06/2021)   Overall Financial Resource Strain (CARDIA)    Difficulty of Paying Living Expenses: Somewhat hard  Food Insecurity: Food Insecurity Present (06/06/2021)   Hunger Vital Sign    Worried About Running Out of Food in the Last Year: Sometimes true    Ran Out of Food in the Last Year: Sometimes true  Transportation Needs: No Transportation Needs (06/06/2021)   PRAPARE - Administrator, Civil Service (Medical): No    Lack of Transportation (Non-Medical): No  Physical Activity: Inactive (06/06/2021)   Exercise Vital Sign    Days of Exercise per Week: 0 days    Minutes of Exercise per Session: 0 min  Stress: Stress Concern Present (06/06/2021)   [employer] of Occupational Health - Occupational Stress Questionnaire    Feeling of Stress : Very much  Social Connections: Moderately Isolated (06/06/2021)   Social Connection and Isolation Panel [NHANES]    Frequency of Communication with Friends and Family: More than three times a week    Frequency of Social Gatherings with Friends and Family: More than three times a week    Attends Religious Services: Never    Database administrator or Organizations: No    Attends Banker Meetings: Never    Marital Status: Married    Tobacco Counseling Counseling given: Not Answered   Clinical Intake:              How often do you need to have someone help you when you read instructions, pamphlets, or other written materials from your doctor or pharmacy?: (P) 2 - Rarely  Diabetic?Yes  Nutrition Risk Assessment:  Has the patient had any N/V/D within the last 2 months?  {YES/NO:21197} Does the patient have any non-healing wounds?  {YES/NO:21197} Has the patient had any unintentional weight loss or weight gain?  {YES/NO:21197}  Diabetes:  Is the patient diabetic?  {YES/NO:21197} If diabetic, was a CBG obtained today?  {YES/NO:21197} Did the patient bring in their  glucometer from home?  {YES/NO:21197} How often do you monitor your CBG's? ***.   Financial Strains and Diabetes Management:  Are you having any financial strains with the device, your supplies or your medication? {YES/NO:21197}.  Does the patient want to be seen by Chronic Care Management for management of their diabetes?  {YES/NO:21197} Would the patient like to be referred to a Nutritionist or for Diabetic Management?  {YES/NO:21197}  Diabetic Exams:  Diabetic Eye Exam: Completed 03/03/22 Diabetic Foot Exam: Completed 07/09/22          Activities of Daily Living    02/17/2023    2:43 PM 04/08/2022    9:13 AM  In your present state of health, do you have any difficulty performing the following activities:  Hearing? 0 0  Vision? 1 0  Difficulty concentrating or making decisions? 1 1  Walking or climbing stairs? 1 1  Dressing or bathing? 1 1  Doing errands, shopping? 1 1  Preparing Food and eating ? N N  Using the Toilet? N N  In the past six months, have you accidently leaked urine? Malvin Johns  Do you have problems  with loss of bowel control? N N  Managing your Medications? N N  Managing your Finances? N N  Housekeeping or managing your Housekeeping? Malvin Johns    Patient Care Team: Hoy Register, MD as PCP - General (Family Medicine)  Indicate any recent Medical Services you may have received from other than [facility] providers in the past year (date may be approximate).     Assessment:   This is a routine wellness examination for [name].  Hearing/Vision screen No results found.  Dietary issues and exercise activities discussed:     Goals Addressed   None    Depression Screen    10/09/2022   10:27 AM 07/09/2022   10:18 AM 04/08/2022   10:04 AM 12/23/2021    9:42 AM 11/14/2021   10:15 AM 06/18/2021   10:18 AM 06/05/2021    4:48 PM  PHQ 2/9 Scores  PHQ - 2 Score 2 4 4 4 3 5 6   PHQ- 9 Score 8 16 14 18 15 19 24     Fall Risk    02/17/2023    2:43 PM 01/15/2023   11:34 AM  10/09/2022    9:44 AM 04/08/2022    9:13 AM 12/23/2021    9:42 AM  Fall Risk   Falls in the past year? 0 0 0 0 0  Number falls in past yr: 0 0 0 0 0  Injury with Fall? 0 0 0 1 0  Risk for fall due to :  No Fall Risks       FALL RISK PREVENTION PERTAINING TO THE HOME:  Any stairs in or around the home? {YES/NO:21197} If so, are there any without handrails? {YES/NO:21197} Home free of loose throw rugs in walkways, pet beds, electrical cords, etc? {YES/NO:21197} Adequate lighting in your home to reduce risk of falls? {YES/NO:21197}  ASSISTIVE DEVICES UTILIZED TO PREVENT FALLS:  Life alert? {YES/NO:21197} Use of a cane, walker or w/c? {YES/NO:21197} Grab bars in the bathroom? {YES/NO:21197} Shower chair or bench in shower? {YES/NO:21197} Elevated toilet seat or a handicapped toilet? {YES/NO:21197}  TIMED UP AND GO:  Was the test performed? No . Telephonic visit   Cognitive Function:    12/23/2021    9:45 AM  MMSE - Mini Mental State Exam  Orientation to time 5  Orientation to Place 5  Registration 3  Attention/ Calculation 5  Recall 3  Language- name 2 objects 2  Language- repeat 1  Language- follow 3 step command 3  Language- read & follow direction 1  Write a sentence 1  Copy design 1  Total score 30        06/06/2021    3:28 PM  6CIT Screen  What Year? 0 points  What month? 0 points  What time? 0 points  Count back from 20 0 points  Months in reverse 0 points  Repeat phrase 0 points  Total Score 0 points    Immunizations Immunization History  Administered Date(s) Administered   Influenza,inj,Quad PF,6+ Mos 06/05/2014, 08/01/2015, 07/02/2016, 06/19/2017, 06/20/2021, 07/09/2022   PFIZER(Purple Top)SARS-COV-2 Vaccination 08/29/2020   Pneumococcal Polysaccharide-23 04/29/2013, 02/21/2017   Zoster Recombinat (Shingrix) 10/09/2022    {TDAP status:2101805}  {Pneumococcal vaccine status:2101807}  Covid-19 vaccine status: Information provided on how to obtain  vaccines.   Qualifies for Shingles Vaccine? Yes   Zostavax completed No   Shingrix Completed?: No.    Education has been provided regarding the importance of this vaccine. Patient has been advised to call insurance company to determine out of  pocket expense if they have not yet received this vaccine. Advised may also receive vaccine at local pharmacy or Health Dept. Verbalized acceptance and understanding.  Screening Tests Health Maintenance  Topic Date Due   COVID-19 Vaccine (2 - Pfizer risk series) 09/19/2020   Zoster Vaccines- Shingrix (2 of 2) 12/04/2022   Medicare Annual Wellness (AWV)  12/24/2022   OPHTHALMOLOGY EXAM  03/04/2023   INFLUENZA VACCINE  04/23/2023   FOOT EXAM  07/10/2023   HEMOGLOBIN A1C  07/17/2023   Diabetic kidney evaluation - Urine ACR  10/10/2023   Diabetic kidney evaluation - eGFR measurement  02/06/2024   PAP SMEAR-Modifier  12/23/2024   Colonoscopy  12/25/2024   MAMMOGRAM  01/22/2025   Hepatitis C Screening  Completed   HIV Screening  Completed   HPV VACCINES  Aged Out   DTaP/Tdap/Td  Discontinued    Health Maintenance  Health Maintenance Due  Topic Date Due   COVID-19 Vaccine (2 - Pfizer risk series) 09/19/2020   Zoster Vaccines- Shingrix (2 of 2) 12/04/2022   Medicare Annual Wellness (AWV)  12/24/2022    Colorectal cancer screening: Type of screening: Colonoscopy. Completed 12/25/21. Repeat every 3 years  Mammogram status: Completed 01/23/23. Repeat every year  Lung Cancer Screening: (Low Dose CT Chest recommended if Age 65-80 years, 30 pack-year currently smoking OR have quit w/in 15years.) does not qualify.   Lung Cancer Screening Referral: n/a  Additional Screening:  Hepatitis C Screening: does qualify; Completed 11/28/20  Vision Screening: Recommended annual ophthalmology exams for early detection of glaucoma and other disorders of the eye. Is the patient up to date with their annual eye exam?  {YES/NO:21197} Who is the provider or what is  the name of the office in which the patient attends annual eye exams? *** If pt is not established with a provider, would they like to be referred to a provider to establish care? {YES/NO:21197}.   Dental Screening: Recommended annual dental exams for proper oral hygiene  Community Resource Referral / Chronic Care Management: CRR required this visit?  {YES/NO:21197}  CCM required this visit?  {YES/NO:21197}     Plan:     I have personally reviewed and noted the following in the patient's chart:   Medical and social history Use of alcohol, tobacco or illicit drugs  Current medications and supplements including opioid prescriptions. {Opioid [phone]} Functional ability and status Nutritional status Physical activity Advanced directives List of other physicians Hospitalizations, surgeries, and ER visits in previous 12 months Vitals Screenings to include cognitive, depression, and falls Referrals and appointments  In addition, I have reviewed and discussed with patient certain preventive protocols, quality metrics, and best practice recommendations. A written personalized care plan for preventive services as well as general preventive health recommendations were provided to patient.     Durwin Nora, [place]   5/40/9811   Due to this being a virtual visit, the after visit summary with patients personalized plan was offered to patient via mail or my-chart. ***Patient declined at this time./ Patient would like to access on my-chart/ per request, patient was mailed a copy of AVS./ Patient preferred to pick up at office at next visit  Nurse Notes: ***

## 2023-02-17 NOTE — Patient Instructions (Incomplete)
[name] , Thank you for taking time to come for your Medicare Wellness Visit. I appreciate your ongoing commitment to your health goals. Please review the following plan we discussed and let me know if I can assist you in the future.   These are the goals we discussed:  Goals      Obtain Supportive Resources/Management of MH conditions     Activities and task to complete in order to accomplish goals.   Keep all upcoming appointments discussed today Continue with compliance of taking medication prescribed by Doctor Implement healthy coping skills discussed to assist with management of symptoms          This is a list of the screening recommended for you and due dates:  Health Maintenance  Topic Date Due   COVID-19 Vaccine (2 - Pfizer risk series) 09/19/2020   Zoster (Shingles) Vaccine (2 of 2) 12/04/2022   Medicare Annual Wellness Visit  12/24/2022   Eye exam for diabetics  03/04/2023   Flu Shot  04/23/2023   Complete foot exam   07/10/2023   Hemoglobin A1C  07/17/2023   Yearly kidney health urinalysis for diabetes  10/10/2023   Yearly kidney function blood test for diabetes  02/06/2024   Pap Smear  12/23/2024   Colon Cancer Screening  12/25/2024   Mammogram  01/22/2025   Hepatitis C Screening  Completed   HIV Screening  Completed   HPV Vaccine  Aged Out   DTaP/Tdap/Td vaccine  Discontinued    Advanced directives: Information on Advanced Care Planning can be found at [facility] Advance Health Care Directives Advance Health Care Directives ([email or web address]  Please bring a copy of your health care power of attorney and living will to the office to be added to your chart at your convenience.   Conditions/risks identified: Aim for 30 minutes of exercise or brisk walking, 6-8 glasses of water, and 5 servings of fruits and vegetables each day.  Next appointment: Follow up in one year for your annual wellness visit.   Preventive Care 40-64 Years,  Female Preventive care refers to lifestyle choices and visits with your health care provider that can promote health and wellness. What does preventive care include? A yearly physical exam. This is also called an annual well check. Dental exams once or twice a year. Routine eye exams. Ask your health care provider how often you should have your eyes checked. Personal lifestyle choices, including: Daily care of your teeth and gums. Regular physical activity. Eating a healthy diet. Avoiding tobacco and drug use. Limiting alcohol use. Practicing safe sex. Taking low-dose aspirin daily starting at age 13. Taking vitamin and mineral supplements as recommended by your health care provider. What happens during an annual well check? The services and screenings done by your health care provider during your annual well check will depend on your age, overall health, lifestyle risk factors, and family history of disease. Counseling  Your health care provider may ask you questions about your: Alcohol use. Tobacco use. Drug use. Emotional well-being. Home and relationship well-being. Sexual activity. Eating habits. Work and work Astronomer. Method of birth control. Menstrual cycle. Pregnancy history. Screening  You may have the following tests or measurements: Height, weight, and BMI. Blood pressure. Lipid and cholesterol levels. These may be checked every 5 years, or more frequently if you are over 42 years old. Skin check. Lung cancer screening. You may have this screening every year starting at age 97 if you have a 30-pack-year  history of smoking and currently smoke or have quit within the past 15 years. Fecal occult blood test (FOBT) of the stool. You may have this test every year starting at age 55. Flexible sigmoidoscopy or colonoscopy. You may have a sigmoidoscopy every 5 years or a colonoscopy every 10 years starting at age 50. Hepatitis C blood test. Hepatitis B blood  test. Sexually transmitted disease (STD) testing. Diabetes screening. This is done by checking your blood sugar (glucose) after you have not eaten for a while (fasting). You may have this done every 1-3 years. Mammogram. This may be done every 1-2 years. Talk to your health care provider about when you should start having regular mammograms. This may depend on whether you have a family history of breast cancer. BRCA-related cancer screening. This may be done if you have a family history of breast, ovarian, tubal, or peritoneal cancers. Pelvic exam and Pap test. This may be done every 3 years starting at age 82. Starting at age 74, this may be done every 5 years if you have a Pap test in combination with an HPV test. Bone density scan. This is done to screen for osteoporosis. You may have this scan if you are at high risk for osteoporosis. Discuss your test results, treatment options, and if necessary, the need for more tests with your health care provider. Vaccines  Your health care provider may recommend certain vaccines, such as: Influenza vaccine. This is recommended every year. Tetanus, diphtheria, and acellular pertussis (Tdap, Td) vaccine. You may need a Td booster every 10 years. Zoster vaccine. You may need this after age 13. Pneumococcal 13-valent conjugate (PCV13) vaccine. You may need this if you have certain conditions and were not previously vaccinated. Pneumococcal polysaccharide (PPSV23) vaccine. You may need one or two doses if you smoke cigarettes or if you have certain conditions. Talk to your health care provider about which screenings and vaccines you need and how often you need them. This information is not intended to replace advice given to you by your health care provider. Make sure you discuss any questions you have with your health care provider. Document Released: 10/05/2015 Document Revised: 05/28/2016 Document Reviewed: 07/10/2015 Elsevier Interactive Patient Education   2017 [employer].    Fall Prevention in the Home Falls can cause injuries. They can happen to people of all ages. There are many things you can do to make your home safe and to help prevent falls. What can I do on the outside of my home? Regularly fix the edges of walkways and driveways and fix any cracks. Remove anything that might make you trip as you walk through a door, such as a raised step or threshold. Trim any bushes or trees on the path to your home. Use bright outdoor lighting. Clear any walking paths of anything that might make someone trip, such as rocks or tools. Regularly check to see if handrails are loose or broken. Make sure that both sides of any steps have handrails. Any raised decks and porches should have guardrails on the edges. Have any leaves, snow, or ice cleared regularly. Use sand or salt on walking paths during winter. Clean up any spills in your garage right away. This includes oil or grease spills. What can I do in the bathroom? Use night lights. Install grab bars by the toilet and in the tub and shower. Do not use towel bars as grab bars. Use non-skid mats or decals in the tub or shower. If you need  to sit down in the shower, use a plastic, non-slip stool. Keep the floor dry. Clean up any water that spills on the floor as soon as it happens. Remove soap buildup in the tub or shower regularly. Attach bath mats securely with double-sided non-slip rug tape. Do not have throw rugs and other things on the floor that can make you trip. What can I do in the bedroom? Use night lights. Make sure that you have a light by your bed that is easy to reach. Do not use any sheets or blankets that are too big for your bed. They should not hang down onto the floor. Have a firm chair that has side arms. You can use this for support while you get dressed. Do not have throw rugs and other things on the floor that can make you trip. What can I do in the kitchen? Clean up  any spills right away. Avoid walking on wet floors. Keep items that you use a lot in easy-to-reach places. If you need to reach something above you, use a strong step stool that has a grab bar. Keep electrical cords out of the way. Do not use floor polish or wax that makes floors slippery. If you must use wax, use non-skid floor wax. Do not have throw rugs and other things on the floor that can make you trip. What can I do with my stairs? Do not leave any items on the stairs. Make sure that there are handrails on both sides of the stairs and use them. Fix handrails that are broken or loose. Make sure that handrails are as long as the stairways. Check any carpeting to make sure that it is firmly attached to the stairs. Fix any carpet that is loose or worn. Avoid having throw rugs at the top or bottom of the stairs. If you do have throw rugs, attach them to the floor with carpet tape. Make sure that you have a light switch at the top of the stairs and the bottom of the stairs. If you do not have them, ask someone to add them for you. What else can I do to help prevent falls? Wear shoes that: Do not have high heels. Have rubber bottoms. Are comfortable and fit you well. Are closed at the toe. Do not wear sandals. If you use a stepladder: Make sure that it is fully opened. Do not climb a closed stepladder. Make sure that both sides of the stepladder are locked into place. Ask someone to hold it for you, if possible. Clearly mark and make sure that you can see: Any grab bars or handrails. First and last steps. Where the edge of each step is. Use tools that help you move around (mobility aids) if they are needed. These include: Canes. Walkers. Scooters. Crutches. Turn on the lights when you go into a dark area. Replace any light bulbs as soon as they burn out. Set up your furniture so you have a clear path. Avoid moving your furniture around. If any of your floors are uneven, fix  them. If there are any pets around you, be aware of where they are. Review your medicines with your doctor. Some medicines can make you feel dizzy. This can increase your chance of falling. Ask your doctor what other things that you can do to help prevent falls. This information is not intended to replace advice given to you by your health care provider. Make sure you discuss any questions you have with your health  care provider. Document Released: 07/05/2009 Document Revised: 02/14/2016 Document Reviewed: 10/13/2014 Elsevier Interactive Patient Education  2017 [employer].

## 2023-02-18 ENCOUNTER — Ambulatory Visit: Payer: 59 | Attending: Family Medicine

## 2023-02-18 VITALS — Ht 61.0 in | Wt 266.0 lb

## 2023-02-18 DIAGNOSIS — Z0000 Encounter for general adult medical examination without abnormal findings: Secondary | ICD-10-CM | POA: Diagnosis not present

## 2023-02-19 ENCOUNTER — Ambulatory Visit (HOSPITAL_COMMUNITY): Payer: 59 | Attending: Cardiology

## 2023-02-19 DIAGNOSIS — I5032 Chronic diastolic (congestive) heart failure: Secondary | ICD-10-CM

## 2023-02-19 DIAGNOSIS — I10 Essential (primary) hypertension: Secondary | ICD-10-CM

## 2023-02-19 DIAGNOSIS — I5033 Acute on chronic diastolic (congestive) heart failure: Secondary | ICD-10-CM

## 2023-02-19 DIAGNOSIS — Z79899 Other long term (current) drug therapy: Secondary | ICD-10-CM

## 2023-02-19 LAB — ECHOCARDIOGRAM COMPLETE
Area-P 1/2: 5.02 cm2
Est EF: 50
S' Lateral: 3.7 cm

## 2023-02-19 MED ORDER — PERFLUTREN LIPID MICROSPHERE
1.0000 mL | INTRAVENOUS | Status: AC | PRN
Start: 2023-02-19 — End: 2023-02-19
  Administered 2023-02-19: 3 mL via INTRAVENOUS

## 2023-02-19 NOTE — Progress Notes (Signed)
  Care Coordination Note  02/19/2023 Name: [name] MRN: [id number] DOB: [birth year]  [name] is a 52 y.o. year old female who is a primary care patient of Hoy Register, MD and is actively engaged with the care management team. I reached out to [name] by phone today to assist with re-scheduling a follow up visit with the Licensed Clinical Social Worker  Follow up plan: We have been unable to make contact with the patient for follow up.  Burman Nieves, CCMA Care Coordination Care Guide Direct Dial: [phone]

## 2023-02-23 ENCOUNTER — Other Ambulatory Visit: Payer: Self-pay | Admitting: Family Medicine

## 2023-02-23 DIAGNOSIS — R112 Nausea with vomiting, unspecified: Secondary | ICD-10-CM

## 2023-02-24 NOTE — Telephone Encounter (Signed)
Requested medications are due for refill today.  yes  Requested medications are on the active medications list.  yes  Last refill. 01/21/2023 #90 0 rf  Future visit scheduled.   yes  Notes to clinic.  Refill not delegated.    Requested Prescriptions  Pending Prescriptions Disp Refills   tiZANidine (ZANAFLEX) 4 MG tablet [Pharmacy Med Name: tiZANidine HCl 4 MG Oral Tablet] 90 tablet 0    Sig: TAKE 1 TABLET BY MOUTH EVERY 8 HOURS AS NEEDED FOR MUSCLE SPASM     Not Delegated - Cardiovascular:  Alpha-2 Agonists - tizanidine Failed - 02/23/2023 11:39 AM      Failed - This refill cannot be delegated      Passed - Valid encounter within last 6 months    Recent Outpatient Visits           1 month ago Type 1 diabetes mellitus without complication (HCC)   [facility] [place], [place], MD   4 months ago Type 1 diabetes mellitus with other specified complication [facility])   [place] [facility] [place], [place], MD   7 months ago Type 1 diabetes mellitus with other specified complication [facility])   [place] [facility] [place], [place], MD   10 months ago Type 1 diabetes mellitus with other specified complication [facility])   [place] [facility] [place], Odette Horns, MD   1 year ago Encounter for [employer] annual wellness exam   [facility] Hoy Register, MD       Future Appointments             In 2 weeks Celine Mans, Olene Craven, MD [facility] [place] [facility] [place]   In 1 month Hoy Register, MD [facility]   In 4 months Shari Prows, Kathlynn Grate, MD [facility]

## 2023-02-24 NOTE — Telephone Encounter (Signed)
Requested medications are due for refill today.  yes  Requested medications are on the active medications list.  yes  Last refill. Tizanidine 01/21/2023 #90 0 rf, Zofran 12/25/2022 #30 0 rf  Future visit scheduled.   yes  Notes to clinic.  Refills not delegated.    Requested Prescriptions  Pending Prescriptions Disp Refills   tiZANidine (ZANAFLEX) 4 MG tablet [Pharmacy Med Name: tiZANidine HCl 4 MG Oral Tablet] 60 tablet 0    Sig: TAKE 1 TABLET BY MOUTH EVERY 8 HOURS AS NEEDED FOR MUSCLE SPASM     Not Delegated - Cardiovascular:  Alpha-2 Agonists - tizanidine Failed - 02/23/2023 11:39 AM      Failed - This refill cannot be delegated      Passed - Valid encounter within last 6 months    Recent Outpatient Visits           1 month ago Type 1 diabetes mellitus without complication (HCC)   [place] [facility] [place], [place], MD   4 months ago Type 1 diabetes mellitus with other specified complication [facility])   [place] [facility] [place], [place], MD   7 months ago Type 1 diabetes mellitus with other specified complication [facility])   [place] [facility] [place], [place], MD   10 months ago Type 1 diabetes mellitus with other specified complication [facility])   [place] [facility] [place], Odette Horns, MD   1 year ago Encounter for [employer] annual wellness exam   [place] [facility] Hoy Register, MD       Future Appointments             In 2 weeks Celine Mans, Olene Craven, MD [place] [facility] [place]   In 1 month Hoy Register, MD [facility]   In 4 months Shari Prows, Kathlynn Grate, MD [place] [facility], [facility]             ondansetron (ZOFRAN) 4 MG tablet [Pharmacy Med Name: Ondansetron HCl 4 MG Oral Tablet] 30 tablet 0    Sig: TAKE 1 TABLET BY MOUTH EVERY 8 HOURS AS NEEDED FOR NAUSEA  FOR VOMITING     Not Delegated - Gastroenterology: Antiemetics - ondansetron Failed - 02/23/2023 11:39 AM      Failed - This refill cannot be delegated      Passed - AST in normal range and within 360 days    AST  Date Value Ref Range Status  10/09/2022 13 0 - 40 IU/L Final         Passed - ALT in normal range and within 360 days    ALT  Date Value Ref Range Status  10/09/2022 15 0 - 32 IU/L Final         Passed - Valid encounter within last 6 months    Recent Outpatient Visits           1 month ago Type 1 diabetes mellitus without complication (HCC)   [place] [facility] [place], [place], MD   4 months ago Type 1 diabetes mellitus with other specified complication [facility])   [place] [facility] [place], [place], MD   7 months ago Type 1 diabetes mellitus with other specified complication [facility])   [place] [facility] [place], Odette Horns, MD   10 months ago Type 1 diabetes  mellitus with other specified complication [facility])   [place] [facility] [place], Odette Horns, MD   1 year ago Encounter for [employer] annual wellness exam   [place] [facility] Hoy Register, MD       Future Appointments             In 2 weeks Celine Mans, Olene Craven, MD [facility] at [place]   In 1 month Hoy Register, MD [facility]   In 4 months Shari Prows, Kathlynn Grate, MD [facility], [facility]

## 2023-02-27 DIAGNOSIS — Z79899 Other long term (current) drug therapy: Secondary | ICD-10-CM | POA: Diagnosis not present

## 2023-02-27 DIAGNOSIS — M5416 Radiculopathy, lumbar region: Secondary | ICD-10-CM | POA: Diagnosis not present

## 2023-03-02 ENCOUNTER — Telehealth: Payer: Self-pay | Admitting: Family Medicine

## 2023-03-02 NOTE — Telephone Encounter (Signed)
Alice with [employer] is calling to see if pt PCP received the certificate of medical necessity that needs to be filled out and sent back to them. Please advise and call Alice back.   [phone]

## 2023-03-02 NOTE — Telephone Encounter (Signed)
Order has been faxed to [employer]. 

## 2023-03-07 DIAGNOSIS — G4733 Obstructive sleep apnea (adult) (pediatric): Secondary | ICD-10-CM | POA: Diagnosis not present

## 2023-03-07 DIAGNOSIS — J9610 Chronic respiratory failure, unspecified whether with hypoxia or hypercapnia: Secondary | ICD-10-CM | POA: Diagnosis not present

## 2023-03-07 DIAGNOSIS — J449 Chronic obstructive pulmonary disease, unspecified: Secondary | ICD-10-CM | POA: Diagnosis not present

## 2023-03-07 DIAGNOSIS — J45909 Unspecified asthma, uncomplicated: Secondary | ICD-10-CM | POA: Diagnosis not present

## 2023-03-09 ENCOUNTER — Encounter: Payer: Self-pay | Admitting: Family Medicine

## 2023-03-10 ENCOUNTER — Emergency Department (HOSPITAL_COMMUNITY): Payer: 59

## 2023-03-10 ENCOUNTER — Inpatient Hospital Stay (HOSPITAL_COMMUNITY)
Admission: EM | Admit: 2023-03-10 | Discharge: 2023-03-13 | DRG: 683 | Disposition: A | Discharge status: Home Health | Payer: 59 | Attending: Student | Admitting: Student

## 2023-03-10 ENCOUNTER — Encounter (HOSPITAL_COMMUNITY): Payer: Self-pay

## 2023-03-10 ENCOUNTER — Inpatient Hospital Stay (HOSPITAL_COMMUNITY): Payer: 59

## 2023-03-10 ENCOUNTER — Other Ambulatory Visit: Payer: Self-pay

## 2023-03-10 ENCOUNTER — Other Ambulatory Visit: Payer: Self-pay | Admitting: Family Medicine

## 2023-03-10 DIAGNOSIS — Z6841 Body Mass Index (BMI) 40.0 and over, adult: Secondary | ICD-10-CM | POA: Diagnosis not present

## 2023-03-10 DIAGNOSIS — Z9181 History of falling: Secondary | ICD-10-CM | POA: Diagnosis present

## 2023-03-10 DIAGNOSIS — Z87891 Personal history of nicotine dependence: Secondary | ICD-10-CM

## 2023-03-10 DIAGNOSIS — M545 Low back pain, unspecified: Secondary | ICD-10-CM | POA: Diagnosis present

## 2023-03-10 DIAGNOSIS — R519 Headache, unspecified: Secondary | ICD-10-CM | POA: Diagnosis not present

## 2023-03-10 DIAGNOSIS — T464X5A Adverse effect of angiotensin-converting-enzyme inhibitors, initial encounter: Secondary | ICD-10-CM | POA: Diagnosis present

## 2023-03-10 DIAGNOSIS — N179 Acute kidney failure, unspecified: Principal | ICD-10-CM | POA: Diagnosis present

## 2023-03-10 DIAGNOSIS — I129 Hypertensive chronic kidney disease with stage 1 through stage 4 chronic kidney disease, or unspecified chronic kidney disease: Secondary | ICD-10-CM | POA: Diagnosis present

## 2023-03-10 DIAGNOSIS — T796XXA Traumatic ischemia of muscle, initial encounter: Secondary | ICD-10-CM | POA: Diagnosis present

## 2023-03-10 DIAGNOSIS — Z794 Long term (current) use of insulin: Secondary | ICD-10-CM | POA: Diagnosis not present

## 2023-03-10 DIAGNOSIS — G8929 Other chronic pain: Secondary | ICD-10-CM | POA: Diagnosis not present

## 2023-03-10 DIAGNOSIS — E875 Hyperkalemia: Secondary | ICD-10-CM | POA: Diagnosis present

## 2023-03-10 DIAGNOSIS — Z7951 Long term (current) use of inhaled steroids: Secondary | ICD-10-CM | POA: Diagnosis not present

## 2023-03-10 DIAGNOSIS — Z90710 Acquired absence of both cervix and uterus: Secondary | ICD-10-CM

## 2023-03-10 DIAGNOSIS — Z8709 Personal history of other diseases of the respiratory system: Secondary | ICD-10-CM | POA: Diagnosis not present

## 2023-03-10 DIAGNOSIS — N1831 Chronic kidney disease, stage 3a: Secondary | ICD-10-CM | POA: Diagnosis present

## 2023-03-10 DIAGNOSIS — T1490XA Injury, unspecified, initial encounter: Secondary | ICD-10-CM | POA: Diagnosis not present

## 2023-03-10 DIAGNOSIS — J9611 Chronic respiratory failure with hypoxia: Secondary | ICD-10-CM | POA: Diagnosis not present

## 2023-03-10 DIAGNOSIS — R531 Weakness: Secondary | ICD-10-CM

## 2023-03-10 DIAGNOSIS — I951 Orthostatic hypotension: Secondary | ICD-10-CM | POA: Diagnosis present

## 2023-03-10 DIAGNOSIS — S199XXA Unspecified injury of neck, initial encounter: Secondary | ICD-10-CM | POA: Diagnosis not present

## 2023-03-10 DIAGNOSIS — E1165 Type 2 diabetes mellitus with hyperglycemia: Secondary | ICD-10-CM | POA: Diagnosis not present

## 2023-03-10 DIAGNOSIS — E1122 Type 2 diabetes mellitus with diabetic chronic kidney disease: Secondary | ICD-10-CM | POA: Diagnosis not present

## 2023-03-10 DIAGNOSIS — R55 Syncope and collapse: Secondary | ICD-10-CM | POA: Diagnosis not present

## 2023-03-10 DIAGNOSIS — W06XXXA Fall from bed, initial encounter: Secondary | ICD-10-CM | POA: Diagnosis present

## 2023-03-10 DIAGNOSIS — J301 Allergic rhinitis due to pollen: Secondary | ICD-10-CM | POA: Diagnosis not present

## 2023-03-10 DIAGNOSIS — Z7985 Long-term (current) use of injectable non-insulin antidiabetic drugs: Secondary | ICD-10-CM

## 2023-03-10 DIAGNOSIS — R200 Anesthesia of skin: Secondary | ICD-10-CM | POA: Diagnosis present

## 2023-03-10 DIAGNOSIS — E119 Type 2 diabetes mellitus without complications: Secondary | ICD-10-CM

## 2023-03-10 DIAGNOSIS — Z825 Family history of asthma and other chronic lower respiratory diseases: Secondary | ICD-10-CM

## 2023-03-10 DIAGNOSIS — Z79899 Other long term (current) drug therapy: Secondary | ICD-10-CM | POA: Diagnosis not present

## 2023-03-10 DIAGNOSIS — W19XXXA Unspecified fall, initial encounter: Secondary | ICD-10-CM | POA: Diagnosis present

## 2023-03-10 DIAGNOSIS — R569 Unspecified convulsions: Secondary | ICD-10-CM | POA: Diagnosis not present

## 2023-03-10 DIAGNOSIS — Z8249 Family history of ischemic heart disease and other diseases of the circulatory system: Secondary | ICD-10-CM

## 2023-03-10 DIAGNOSIS — D649 Anemia, unspecified: Secondary | ICD-10-CM | POA: Diagnosis not present

## 2023-03-10 DIAGNOSIS — I152 Hypertension secondary to endocrine disorders: Secondary | ICD-10-CM

## 2023-03-10 DIAGNOSIS — Z833 Family history of diabetes mellitus: Secondary | ICD-10-CM

## 2023-03-10 DIAGNOSIS — Z841 Family history of disorders of kidney and ureter: Secondary | ICD-10-CM

## 2023-03-10 DIAGNOSIS — E1069 Type 1 diabetes mellitus with other specified complication: Secondary | ICD-10-CM

## 2023-03-10 DIAGNOSIS — D638 Anemia in other chronic diseases classified elsewhere: Secondary | ICD-10-CM | POA: Diagnosis present

## 2023-03-10 DIAGNOSIS — Z9189 Other specified personal risk factors, not elsewhere classified: Secondary | ICD-10-CM | POA: Diagnosis not present

## 2023-03-10 DIAGNOSIS — J309 Allergic rhinitis, unspecified: Secondary | ICD-10-CM | POA: Diagnosis present

## 2023-03-10 DIAGNOSIS — K219 Gastro-esophageal reflux disease without esophagitis: Secondary | ICD-10-CM | POA: Diagnosis present

## 2023-03-10 DIAGNOSIS — Z888 Allergy status to other drugs, medicaments and biological substances status: Secondary | ICD-10-CM

## 2023-03-10 DIAGNOSIS — Z930 Tracheostomy status: Secondary | ICD-10-CM

## 2023-03-10 DIAGNOSIS — D631 Anemia in chronic kidney disease: Secondary | ICD-10-CM | POA: Diagnosis not present

## 2023-03-10 DIAGNOSIS — R29898 Other symptoms and signs involving the musculoskeletal system: Secondary | ICD-10-CM

## 2023-03-10 DIAGNOSIS — J4489 Other specified chronic obstructive pulmonary disease: Secondary | ICD-10-CM | POA: Diagnosis not present

## 2023-03-10 DIAGNOSIS — E785 Hyperlipidemia, unspecified: Secondary | ICD-10-CM | POA: Diagnosis present

## 2023-03-10 DIAGNOSIS — I700 Atherosclerosis of aorta: Secondary | ICD-10-CM | POA: Diagnosis not present

## 2023-03-10 DIAGNOSIS — E860 Dehydration: Secondary | ICD-10-CM | POA: Diagnosis present

## 2023-03-10 DIAGNOSIS — R34 Anuria and oliguria: Secondary | ICD-10-CM

## 2023-03-10 DIAGNOSIS — R29818 Other symptoms and signs involving the nervous system: Secondary | ICD-10-CM | POA: Diagnosis not present

## 2023-03-10 DIAGNOSIS — N183 Chronic kidney disease, stage 3 unspecified: Secondary | ICD-10-CM | POA: Diagnosis not present

## 2023-03-10 DIAGNOSIS — E1140 Type 2 diabetes mellitus with diabetic neuropathy, unspecified: Secondary | ICD-10-CM | POA: Diagnosis present

## 2023-03-10 DIAGNOSIS — T500X5A Adverse effect of mineralocorticoids and their antagonists, initial encounter: Secondary | ICD-10-CM | POA: Diagnosis present

## 2023-03-10 DIAGNOSIS — Z9049 Acquired absence of other specified parts of digestive tract: Secondary | ICD-10-CM

## 2023-03-10 DIAGNOSIS — M6282 Rhabdomyolysis: Secondary | ICD-10-CM | POA: Diagnosis not present

## 2023-03-10 LAB — I-STAT CHEM 8, ED
BUN: 48 mg/dL — ABNORMAL HIGH (ref 6–20)
Calcium, Ion: 1.11 mmol/L — ABNORMAL LOW (ref 1.15–1.40)
Chloride: 107 mmol/L (ref 98–111)
Creatinine, Ser: 4.4 mg/dL — ABNORMAL HIGH (ref 0.44–1.00)
Glucose, Bld: 152 mg/dL — ABNORMAL HIGH (ref 70–99)
HCT: 36 % (ref 36.0–46.0)
Hemoglobin: 12.2 g/dL (ref 12.0–15.0)
Potassium: 5 mmol/L (ref 3.5–5.1)
Sodium: 138 mmol/L (ref 135–145)
TCO2: 22 mmol/L (ref 22–32)

## 2023-03-10 LAB — CBC
HCT: 35.7 % — ABNORMAL LOW (ref 36.0–46.0)
Hemoglobin: 11.3 g/dL — ABNORMAL LOW (ref 12.0–15.0)
MCH: 27.4 pg (ref 26.0–34.0)
MCHC: 31.7 g/dL (ref 30.0–36.0)
MCV: 86.4 fL (ref 80.0–100.0)
Platelets: 280 10*3/uL (ref 150–400)
RBC: 4.13 MIL/uL (ref 3.87–5.11)
RDW: 14.6 % (ref 11.5–15.5)
WBC: 9.6 10*3/uL (ref 4.0–10.5)
nRBC: 0 % (ref 0.0–0.2)

## 2023-03-10 LAB — COMPREHENSIVE METABOLIC PANEL
ALT: 30 U/L (ref 0–44)
AST: 55 U/L — ABNORMAL HIGH (ref 15–41)
Albumin: 3.6 g/dL (ref 3.5–5.0)
Alkaline Phosphatase: 102 U/L (ref 38–126)
Anion gap: 13 (ref 5–15)
BUN: 52 mg/dL — ABNORMAL HIGH (ref 6–20)
CO2: 21 mmol/L — ABNORMAL LOW (ref 22–32)
Calcium: 9 mg/dL (ref 8.9–10.3)
Chloride: 103 mmol/L (ref 98–111)
Creatinine, Ser: 4.22 mg/dL — ABNORMAL HIGH (ref 0.44–1.00)
GFR, Estimated: 12 mL/min — ABNORMAL LOW (ref >60)
Glucose, Bld: 154 mg/dL — ABNORMAL HIGH (ref 70–99)
Potassium: 5 mmol/L (ref 3.5–5.1)
Sodium: 137 mmol/L (ref 135–145)
Total Bilirubin: 0.5 mg/dL (ref 0.3–1.2)
Total Protein: 7.7 g/dL (ref 6.5–8.1)

## 2023-03-10 LAB — URINALYSIS, ROUTINE W REFLEX MICROSCOPIC
Bilirubin Urine: NEGATIVE
Glucose, UA: NEGATIVE mg/dL
Hgb urine dipstick: NEGATIVE
Ketones, ur: NEGATIVE mg/dL
Nitrite: NEGATIVE
Protein, ur: NEGATIVE mg/dL
Specific Gravity, Urine: 1.012 (ref 1.005–1.030)
pH: 5 (ref 5.0–8.0)

## 2023-03-10 LAB — NA AND K (SODIUM & POTASSIUM), RAND UR
Potassium Urine: 16 mmol/L
Sodium, Ur: 62 mmol/L

## 2023-03-10 LAB — DIFFERENTIAL
Abs Immature Granulocytes: 0.02 10*3/uL (ref 0.00–0.07)
Basophils Absolute: 0 10*3/uL (ref 0.0–0.1)
Basophils Relative: 0 %
Eosinophils Absolute: 0.1 10*3/uL (ref 0.0–0.5)
Eosinophils Relative: 1 %
Immature Granulocytes: 0 %
Lymphocytes Relative: 47 %
Lymphs Abs: 4.5 10*3/uL — ABNORMAL HIGH (ref 0.7–4.0)
Monocytes Absolute: 0.8 10*3/uL (ref 0.1–1.0)
Monocytes Relative: 8 %
Neutro Abs: 4.2 10*3/uL (ref 1.7–7.7)
Neutrophils Relative %: 44 %

## 2023-03-10 LAB — APTT: aPTT: 26 seconds (ref 24–36)

## 2023-03-10 LAB — PROTIME-INR
INR: 1 (ref 0.8–1.2)
Prothrombin Time: 13.8 seconds (ref 11.4–15.2)

## 2023-03-10 LAB — CBG MONITORING, ED: Glucose-Capillary: 115 mg/dL — ABNORMAL HIGH (ref 70–99)

## 2023-03-10 LAB — ETHANOL: Alcohol, Ethyl (B): <10 mg/dL (ref <10)

## 2023-03-10 LAB — TROPONIN I (HIGH SENSITIVITY)
Troponin I (High Sensitivity): 10 ng/L (ref <18)
Troponin I (High Sensitivity): 10 ng/L (ref <18)

## 2023-03-10 MED ORDER — ACETAMINOPHEN 650 MG RE SUPP: 650.0000 mg | Freq: Four times a day (QID) | RECTAL | Status: DC | PRN

## 2023-03-10 MED ORDER — ROSUVASTATIN CALCIUM 20 MG PO TABS: 20.0000 mg | ORAL_TABLET | Freq: Every day | ORAL | Status: DC

## 2023-03-10 MED ORDER — ACETAMINOPHEN 325 MG PO TABS
650.0000 mg | ORAL_TABLET | Freq: Four times a day (QID) | ORAL | Status: DC | PRN
  Administered 2023-03-11: 650 mg via ORAL
  Filled 2023-03-10 (×2): qty 2

## 2023-03-10 MED ORDER — SERTRALINE HCL 100 MG PO TABS: 100.0000 mg | ORAL_TABLET | Freq: Every day | ORAL | Status: DC

## 2023-03-10 MED ORDER — TRESIBA FLEXTOUCH 200 UNIT/ML ~~LOC~~ SOPN
170.0000 [IU] | PEN_INJECTOR | Freq: Every day | SUBCUTANEOUS | 3 refills | Status: DC
Start: 2023-03-10 — End: 2023-07-02

## 2023-03-10 MED ORDER — NALOXONE HCL 0.4 MG/ML IJ SOLN
0.4000 mg | INTRAMUSCULAR | Status: DC | PRN
  Filled 2023-03-10: qty 1

## 2023-03-10 MED ORDER — ONDANSETRON HCL 4 MG/2ML IJ SOLN: 4.0000 mg | Freq: Four times a day (QID) | INTRAMUSCULAR | Status: DC | PRN

## 2023-03-10 MED ORDER — MORPHINE SULFATE (PF) 4 MG/ML IV SOLN
4.0000 mg | Freq: Once | INTRAVENOUS | Status: AC
  Administered 2023-03-10: 4 mg via INTRAVENOUS
  Filled 2023-03-10: qty 1

## 2023-03-10 MED ORDER — SODIUM CHLORIDE 0.9 % IV BOLUS
1000.0000 mL | Freq: Once | INTRAVENOUS | Status: AC
  Administered 2023-03-10: 1000 mL via INTRAVENOUS

## 2023-03-10 MED ORDER — SODIUM CHLORIDE 0.9% FLUSH: 3.0000 mL | Freq: Once | INTRAVENOUS | Status: DC

## 2023-03-10 MED ORDER — LACTATED RINGERS IV SOLN: INTRAVENOUS | Status: AC

## 2023-03-10 MED ORDER — INSULIN ASPART 100 UNIT/ML IJ SOLN
0.0000 [IU] | Freq: Three times a day (TID) | INTRAMUSCULAR | Status: DC
  Administered 2023-03-11: 2 [IU] via SUBCUTANEOUS

## 2023-03-10 MED ORDER — PANTOPRAZOLE SODIUM 40 MG IV SOLR: 40.0000 mg | Freq: Two times a day (BID) | INTRAVENOUS | Status: DC

## 2023-03-10 MED ORDER — IOHEXOL 350 MG/ML SOLN
75.0000 mL | Freq: Once | INTRAVENOUS | Status: AC | PRN
  Administered 2023-03-10: 75 mL via INTRAVENOUS

## 2023-03-10 MED ORDER — MELATONIN 3 MG PO TABS
3.0000 mg | ORAL_TABLET | Freq: Every evening | ORAL | Status: DC | PRN
  Administered 2023-03-11: 3 mg via ORAL
  Filled 2023-03-10: qty 1

## 2023-03-10 MED ORDER — MONTELUKAST SODIUM 10 MG PO TABS
10.0000 mg | ORAL_TABLET | Freq: Every day | ORAL | Status: DC
  Administered 2023-03-10 – 2023-03-12 (×3): 10 mg via ORAL
  Filled 2023-03-10 (×3): qty 1

## 2023-03-10 MED ORDER — HYDROMORPHONE HCL 1 MG/ML IJ SOLN
0.5000 mg | INTRAMUSCULAR | Status: DC | PRN
  Administered 2023-03-10 – 2023-03-11 (×5): 0.5 mg via INTRAVENOUS
  Filled 2023-03-10 (×3): qty 0.5
  Filled 2023-03-10: qty 1
  Filled 2023-03-10: qty 0.5

## 2023-03-10 MED ORDER — SODIUM CHLORIDE 0.9 % IV BOLUS: 1000.0000 mL | Freq: Once | INTRAVENOUS | Status: DC

## 2023-03-10 MED ORDER — MORPHINE SULFATE (PF) 4 MG/ML IV SOLN
6.0000 mg | Freq: Once | INTRAVENOUS | Status: AC
  Administered 2023-03-10: 6 mg via INTRAVENOUS
  Filled 2023-03-10: qty 2

## 2023-03-10 NOTE — ED Provider Triage Note (Signed)
Emergency Medicine Provider Triage Evaluation Note  [name] , a 52 y.o. female  was evaluated in triage.  Pt complains of right-sided upper and lower extremity weakness after syncopal episode 3 days ago.  Last known well greater than 3 days ago.  The patient reports that she was sitting on the edge of her bed she that she felt a little dizzy before and that woke up on the ground.  She reports that she has had right arm and right lower leg weakness.  Reports she has been having some more right lower leg numbness tingling that is painful.  She reports that she is also been having a headache.  She reports that she has some chest pain but does not know if it is different than her usual.  She has blurry vision at baseline and wears glasses but denies any worsening of this condition.  No previous stroke.  Review of Systems  Positive:  Negative:   Physical Exam  BP 108/62   Pulse 98   Temp 98.4 F (36.9 C) (Oral)   Resp 20   Ht 5\' 1"  (1.549 m)   Wt 120.7 kg   LMP  (LMP Unknown)   SpO2 96%   BMI 50.26 kg/m  Gen:   Awake, no distress   Resp:  Trach MSK:   Right-sided upper and lower weakness.  She reports sensations intact in her upper extremities however reports it is different in her lower extremities.  She does have a weakened grip strength as well.  Some tremor on the right with pronator assessment however there is no drift.  Cranial nerves grossly intact as well. Other:    Medical Decision Making  Medically screening exam initiated at 1:12 PM.  Appropriate orders placed.  [name] was informed that the remainder of the evaluation will be completed by another provider, this initial triage assessment does not replace that evaluation, and the importance of remaining in the ED until their evaluation is complete.  Out of the window for any code stroke.  Order set with CT angio head and neck ordered given syncopal episode with now deficits.  Discussed with  my attending, Dr. Durwin Nora.   Achille Rich, PA-C 03/10/23 1315

## 2023-03-10 NOTE — ED Notes (Signed)
ED TO INPATIENT HANDOFF REPORT  ED Nurse Name and Phone #: Madysun Thall RN [phone]  S Name/Age/Gender [name] 52 y.o. female Room/Bed: 010C/010C  Code Status   Code Status: Full Code  Home/SNF/Other Home Patient oriented to: self, place, time, and situation Is this baseline? Yes   Triage Complete: Triage complete  Chief Complaint AKI (acute kidney injury) (HCC) [N17.9]  Triage Note Pt c/o numbness, weakness, and pain on entire R side and headache following a syncopal episode x 3 days ago.  Pain score 10/10.  Pt reports taking prescribed pain medication w/o relief.  Pt reports her family says she's been acting "off."  Weakness noted on R side.       Allergies Allergies  Allergen Reactions   Reglan [Metoclopramide] Other (See Comments)    Panic attack    Level of Care/Admitting Diagnosis ED Disposition     ED Disposition  Admit   Condition  --   Comment  Hospital Area: [facility] [[id number]]  Level of Care: Progressive [102]  Admit to Progressive based on following criteria: MULTISYSTEM THREATS such as stable sepsis, metabolic/electrolyte imbalance with or without encephalopathy that is responding to early treatment.  May admit patient to [facility] or [facility] if equivalent level of care is available:: No  Covid Evaluation: Asymptomatic - no recent exposure (last 10 days) testing not required  Diagnosis: AKI (acute kidney injury) [facility]) [[id number]]  Admitting Physician: Angie Fava [[id number]]  Attending Physician: Angie Fava [[id number]]  Certification:: I certify this patient will need inpatient services for at least 2 midnights  Estimated Length of Stay: 2          B Medical/Surgery History Past Medical History:  Diagnosis Date   Abnormal UGI series    Acute pain of right shoulder due to trauma 10/05/2018   Acute respiratory failure with hypoxia and hypercapnia (HCC) 10/23/2018   Allergy    Anemia     Anxiety    Arthritis    Asthma    Chest pain of uncertain etiology 03/22/2019   Chronic back pain    Chronic chest pain    resolved, no problems since 2019 per patient 07/27/19   Coffee ground vomiting 10/14/2021   COPD (chronic obstructive pulmonary disease) (HCC)    inhaler   Depression    DM (diabetes mellitus) (HCC)    INSULIN DEPENDENT - TYPE 1   Dyspnea    GERD (gastroesophageal reflux disease)    Headache(784.0)    History of hiatal hernia    Hyperlipidemia    no med, diet controlled   Hypertension    Hypokalemia    Klebsiella cystitis 03/14/2017   Last Assessment & Plan:   Formatting of this note might be different from the original.  - On Zosyn  - Urine culture: Klebsiella pneumoniae   Neuromuscular disorder (HCC)    Osteoarthritis    Oxygen deficiency    Pneumonia    Respiratory disease 05/2017   uses inhalers, neb tx prn, no oxygen   Rotator cuff tear 07/28/2019   Sleep apnea    does not use CPAP due to trach, uses 2L supplement oxygen at night   Status post tracheostomy (HCC) 03/14/2017   Last Assessment & Plan:  Formatting of this note might be different from the original. - VERY CRITICAL AIRWAY WITH EXTREME PRECUATION - On tach - ENT following along (please see above for current trach management plan)   Tracheostomy in place [facility]) 02/2017  Past Surgical History:  Procedure Laterality Date   ABDOMINAL HYSTERECTOMY  2009   APPENDECTOMY     BIOPSY  10/15/2021   Procedure: BIOPSY;  Surgeon: Tressia Danas, MD;  Location: [facility] ENDOSCOPY;  Service: Gastroenterology;;   CESAREAN SECTION     x 3   CHOLECYSTECTOMY N/A 03/02/2014   Procedure: LAPAROSCOPIC CHOLECYSTECTOMY;  Surgeon: Clovis Pu. Cornett, MD;  Location: [facility] OR;  Service: General;  Laterality: N/A;   ESOPHAGOGASTRODUODENOSCOPY (EGD) WITH PROPOFOL N/A 10/15/2021   Procedure: ESOPHAGOGASTRODUODENOSCOPY (EGD) WITH PROPOFOL;  Surgeon: Tressia Danas, MD;  Location: [facility] ENDOSCOPY;  Service: Gastroenterology;   Laterality: N/A;   HERNIA REPAIR     PANENDOSCOPY N/A 03/04/2017   Procedure: PANENDOSCOPY WITH POSSIBLE FOREIGN BODY REMOVAL;  Surgeon: Flo Shanks, MD;  Location: [facility] OR;  Service: ENT;  Laterality: N/A;   ROTATOR CUFF REPAIR Left    ROTATOR CUFF REPAIR Right 07/27/2019   SHOULDER ARTHROSCOPY WITH SUBACROMIAL DECOMPRESSION, ROTATOR CUFF REPAIR AND BICEP TENDON REPAIR Right 07/28/2019   Procedure: RIGHT SHOULDER ARTHROSCOP, MINI OPEN ROTATOR CUFF TEAR REPAIR,  BICEPS TENODESIS, DISTAL CLAVICLE EXCISION;  Surgeon: Cammy Copa, MD;  Location: [facility] OR;  Service: Orthopedics;  Laterality: Right;   SPINE SURGERY     TOOTH EXTRACTION N/A 02/22/2021   Procedure: DENTAL RESTORATION/EXTRACTIONS;  Surgeon: Ocie Doyne, DMD;  Location: [facility] OR;  Service: Oral Surgery;  Laterality: N/A;   TRACHEOSTOMY  02/2017   TUBAL LIGATION     VESICOVAGINAL FISTULA CLOSURE W/ TAH  2009     A IV Location/Drains/Wounds Patient Lines/Drains/Airways Status     Active Line/Drains/Airways     Name Placement date Placement time Site Days   Peripheral IV 03/10/23 Anterior;Left Forearm 03/10/23  1731  Forearm  less than 1   Tracheostomy Shiley Flexible 4 mm Uncuffed 10/31/21  1857  4 mm  495   Tracheostomy Shiley Flexible 4 mm Uncuffed 08/18/22  1030  4 mm  204            Intake/Output Last 24 hours No intake or output data in the 24 hours ending 03/10/23 2316  Labs/Imaging Results for orders placed or performed during the hospital encounter of 03/10/23 (from the past 48 hour(s))  CBG monitoring, ED     Status: Abnormal   Collection Time: 03/10/23  1:10 PM  Result Value Ref Range   Glucose-Capillary 115 (H) 70 - 99 mg/dL    Comment: Glucose reference range applies only to samples taken after fasting for at least 8 hours.  Protime-INR     Status: None   Collection Time: 03/10/23  1:15 PM  Result Value Ref Range   Prothrombin Time 13.8 11.4 - 15.2 seconds   INR 1.0 0.8 - 1.2    Comment:  (NOTE) INR goal varies based on device and disease states. Performed at [facility], [address] [place], [place] [address]   APTT     Status: None   Collection Time: 03/10/23  1:15 PM  Result Value Ref Range   aPTT 26 24 - 36 seconds    Comment: Performed at [facility], [address], [place], [place] [address]  CBC     Status: Abnormal   Collection Time: 03/10/23  1:15 PM  Result Value Ref Range   WBC 9.6 4.0 - 10.5 K/uL   RBC 4.13 3.87 - 5.11 MIL/uL   Hemoglobin 11.3 (L) 12.0 - 15.0 g/dL   HCT 09.8 (L) 11.9 - 14.7 %   MCV  86.4 80.0 - 100.0 fL   MCH 27.4 26.0 - 34.0 pg   MCHC 31.7 30.0 - 36.0 g/dL   RDW 09.8 11.9 - 14.7 %   Platelets 280 150 - 400 K/uL   nRBC 0.0 0.0 - 0.2 %    Comment: Performed at [facility], [address] [place], [place] [address]  Differential     Status: Abnormal   Collection Time: 03/10/23  1:15 PM  Result Value Ref Range   Neutrophils Relative % 44 %   Neutro Abs 4.2 1.7 - 7.7 K/uL   Lymphocytes Relative 47 %   Lymphs Abs 4.5 (H) 0.7 - 4.0 K/uL   Monocytes Relative 8 %   Monocytes Absolute 0.8 0.1 - 1.0 K/uL   Eosinophils Relative 1 %   Eosinophils Absolute 0.1 0.0 - 0.5 K/uL   Basophils Relative 0 %   Basophils Absolute 0.0 0.0 - 0.1 K/uL   Immature Granulocytes 0 %   Abs Immature Granulocytes 0.02 0.00 - 0.07 K/uL    Comment: Performed at [facility], [address] [place], [place] [address]  Comprehensive metabolic panel     Status: Abnormal   Collection Time: 03/10/23  1:15 PM  Result Value Ref Range   Sodium 137 135 - 145 mmol/L   Potassium 5.0 3.5 - 5.1 mmol/L   Chloride 103 98 - 111 mmol/L   CO2 21 (L) 22 - 32 mmol/L   Glucose, Bld 154 (H) 70 - 99 mg/dL    Comment: Glucose reference range applies only to samples taken after fasting for at least 8 hours.   BUN 52 (H) 6 - 20 mg/dL   Creatinine, Ser 6.57 (H) 0.44 - 1.00 mg/dL   Calcium 9.0 8.9 - 84.6 mg/dL   Total Protein 7.7 6.5 - 8.1 g/dL    Albumin 3.6 3.5 - 5.0 g/dL   AST 55 (H) 15 - 41 U/L   ALT 30 0 - 44 U/L   Alkaline Phosphatase 102 38 - 126 U/L   Total Bilirubin 0.5 0.3 - 1.2 mg/dL   GFR, Estimated 12 (L) >60 mL/min    Comment: (NOTE) Calculated using the CKD-EPI Creatinine Equation (2021)    Anion gap 13 5 - 15    Comment: Performed at [facility], [address] [place], [place] [address]  Ethanol     Status: None   Collection Time: 03/10/23  1:15 PM  Result Value Ref Range   Alcohol, Ethyl (B) <10 <10 mg/dL    Comment: (NOTE) Lowest detectable limit for serum alcohol is 10 mg/dL.  For medical purposes only. Performed at [facility], [address], [place], [place] [address]   I-stat chem 8, ED     Status: Abnormal   Collection Time: 03/10/23  1:19 PM  Result Value Ref Range   Sodium 138 135 - 145 mmol/L   Potassium 5.0 3.5 - 5.1 mmol/L   Chloride 107 98 - 111 mmol/L   BUN 48 (H) 6 - 20 mg/dL   Creatinine, Ser 2.44 (H) 0.44 - 1.00 mg/dL   Glucose, Bld 010 (H) 70 - 99 mg/dL    Comment: Glucose reference range applies only to samples taken after fasting for at least 8 hours.   Calcium, Ion 1.11 (L) 1.15 - 1.40 mmol/L   TCO2 22 22 - 32 mmol/L   Hemoglobin 12.2 12.0 - 15.0 g/dL   HCT 27.2 53.6 - 64.4 %  Troponin I (High  Sensitivity)     Status: None   Collection Time: 03/10/23  1:32 PM  Result Value Ref Range   Troponin I (High Sensitivity) 10 <18 ng/L    Comment: (NOTE) Elevated high sensitivity troponin I (hsTnI) values and significant  changes across serial measurements may suggest ACS but many other  chronic and acute conditions are known to elevate hsTnI results.  Refer to the "Links" section for chest pain algorithms and additional  guidance. Performed at [facility], [address] [place], [place] [address]   Troponin I (High Sensitivity)     Status: None   Collection Time: 03/10/23  4:02 PM  Result Value Ref Range   Troponin I (High Sensitivity) 10 <18 ng/L     Comment: (NOTE) Elevated high sensitivity troponin I (hsTnI) values and significant  changes across serial measurements may suggest ACS but many other  chronic and acute conditions are known to elevate hsTnI results.  Refer to the "Links" section for chest pain algorithms and additional  guidance. Performed at [facility], [address] [place], [place] [address]   Urinalysis, Routine w reflex microscopic -Urine, Clean Catch     Status: Abnormal   Collection Time: 03/10/23  4:58 PM  Result Value Ref Range   Color, Urine YELLOW YELLOW   APPearance HAZY (A) CLEAR   Specific Gravity, Urine 1.012 1.005 - 1.030   pH 5.0 5.0 - 8.0   Glucose, UA NEGATIVE NEGATIVE mg/dL   Hgb urine dipstick NEGATIVE NEGATIVE   Bilirubin Urine NEGATIVE NEGATIVE   Ketones, ur NEGATIVE NEGATIVE mg/dL   Protein, ur NEGATIVE NEGATIVE mg/dL   Nitrite NEGATIVE NEGATIVE   Leukocytes,Ua MODERATE (A) NEGATIVE   RBC / HPF 0-5 0 - 5 RBC/hpf   WBC, UA 0-5 0 - 5 WBC/hpf   Bacteria, UA RARE (A) NONE SEEN   Squamous Epithelial / HPF 11-20 0 - 5 /HPF    Comment: Performed at [facility], [address], [place], [place] [address]  Na and K (sodium & potassium), rand urine     Status: None   Collection Time: 03/10/23  4:58 PM  Result Value Ref Range   Sodium, Ur 62 mmol/L   Potassium Urine 16 mmol/L    Comment: Performed at [facility], [address] [place], [place] [address]   *Note: Due to a large number of results and/or encounters for the requested time period, some results have not been displayed. A complete set of results can be found in Results Review.   MR Cervical Spine Wo Contrast  Result Date: 03/10/2023 CLINICAL DATA:  Neck trauma, focal neuro deficit or paresthesias. EXAM: MRI CERVICAL SPINE WITHOUT CONTRAST TECHNIQUE: Multiplanar, multisequence MR imaging of the cervical spine was performed. No intravenous contrast was administered. COMPARISON:  None Available. FINDINGS:  Alignment: Physiologic. Vertebrae: No fracture, evidence of discitis, or bone lesion. Cord: Normal signal and morphology. Posterior Fossa, vertebral arteries, paraspinal tissues: Negative. Disc levels: C2-3: No significant disc bulge. No neural foraminal stenosis. No central canal stenosis. C3-4: No significant disc bulge. No neural foraminal stenosis. No central canal stenosis. C4-5: No significant disc bulge. No neural foraminal stenosis. No central canal stenosis. C5-6: Mild disc bulge with flattening of the thecal sac. No significant neural foraminal stenosis. C6-7: Disc bulge with flattening of the thecal sac. No significant neural foraminal stenosis. C7-T1: No significant disc bulge. No neural foraminal stenosis. No central canal stenosis. IMPRESSION: 1. No evidence of fracture or  subluxation. 2. Mild degenerative changes at C5-6 and C6-7 without evidence of neural impingement. Electronically Signed   By: Larose Hires D.O.   On: 03/10/2023 22:39   MR BRAIN WO CONTRAST  Result Date: 03/10/2023 CLINICAL DATA:  Acute neurologic deficit EXAM: MRI HEAD WITHOUT CONTRAST TECHNIQUE: Multiplanar, multiecho pulse sequences of the brain and surrounding structures were obtained without intravenous contrast. COMPARISON:  None Available. FINDINGS: Brain: No acute infarct, mass effect or extra-axial collection. No acute or chronic hemorrhage. Normal white matter signal, parenchymal volume and CSF spaces. The midline structures are normal. Vascular: Major flow voids are preserved. Skull and upper cervical spine: Normal calvarium and skull base. Visualized upper cervical spine and soft tissues are normal. Sinuses/Orbits:No paranasal sinus fluid levels or advanced mucosal thickening. No mastoid or middle ear effusion. Normal orbits. IMPRESSION: Normal brain MRI. Electronically Signed   By: Deatra Robinson M.D.   On: 03/10/2023 20:09   CT Angio Chest/Abd/Pel for Dissection W and/or Wo Contrast  Result Date:  03/10/2023 CLINICAL DATA:  Acute aortic syndrome suspected EXAM: CT ANGIOGRAPHY CHEST, ABDOMEN AND PELVIS TECHNIQUE: Non-contrast CT of the chest was initially obtained. Multidetector CT imaging through the chest, abdomen and pelvis was performed using the standard protocol during bolus administration of intravenous contrast. Multiplanar reconstructed images and MIPs were obtained and reviewed to evaluate the vascular anatomy. RADIATION DOSE REDUCTION: This exam was performed according to the departmental dose-optimization program which includes automated exposure control, adjustment of the mA and/or kV according to patient size and/or use of iterative reconstruction technique. CONTRAST:  75mL OMNIPAQUE IOHEXOL 350 MG/ML SOLN COMPARISON:  Multiple priors, most recent chest CT dated Feb 11, 2023. FINDINGS: CTA CHEST FINDINGS Cardiovascular: Normal heart size. No pericardial effusion. No evidence of central pulmonary embolus, evaluation of the segmental and subsegmental pulmonary arteries is somewhat limited due to bolus timing. No atherosclerotic disease. Mediastinum/Nodes: Esophagus thyroid are unremarkable. Area of right paratracheal low-attenuation seen on series 6, image 32 is unchanged when compared multiple priors and likely pericardial recess fluid. No pathologically enlarged lymph nodes seen in the chest. Lungs/Pleura: Tracheostomy tube in place. Central airways are patent. No consolidation, pleural effusion or pneumothorax. Musculoskeletal: No chest wall abnormality. No acute or significant osseous findings. Review of the MIP images confirms the above findings. CTA ABDOMEN AND PELVIS FINDINGS VASCULAR Aorta: Limited evaluation of the ascending thoracic aorta due to cardiac motion. Within limitations, normal caliber thoracic aorta. Arch vessels are patent. Normal caliber abdominal aorta. Minimal atherosclerotic disease. No evidence of dissection or intramural hematoma. Celiac: Patent without evidence of  aneurysm, dissection, vasculitis or significant stenosis. SMA: Patent without evidence of aneurysm, dissection, vasculitis or significant stenosis. Renals: Both renal arteries are patent without evidence of aneurysm, dissection, vasculitis, fibromuscular dysplasia or significant stenosis. IMA: Patent without evidence of aneurysm, dissection, vasculitis or significant stenosis. Inflow: Patent without evidence of aneurysm, dissection, vasculitis or significant stenosis. Veins: No obvious venous abnormality within the limitations of this arterial phase study. Review of the MIP images confirms the above findings. NON-VASCULAR Hepatobiliary: Hepatic steatosis. Prior cholecystectomy. No suspicious liver lesion. No biliary ductal dilation. Pancreas: Unremarkable. No pancreatic ductal dilatation or surrounding inflammatory changes. Spleen: Normal in size without focal abnormality. Adrenals/Urinary Tract: Bilateral adrenal glands are unremarkable. No hydronephrosis or nephrolithiasis. Bladder is unremarkable. Stomach/Bowel: Stomach is within normal limits. No evidence of bowel wall thickening, distention, or inflammatory changes. Lymphatic: No enlarged lymph nodes seen in the abdomen or pelvis. Reproductive: No adnexal mass. Other: No abdominal wall hernia or  abnormality. No abdominopelvic ascites. Musculoskeletal: Posterior fusion hardware of L4-L5. No aggressive appearing osseous lesions. Review of the MIP images confirms the above findings. IMPRESSION: 1. Normal caliber abdominal aorta with no evidence of acute aortic syndrome. 2. No evidence of central pulmonary embolus. Evaluation of the segmental and subsegmental pulmonary arteries is somewhat limited due to bolus timing. 3. No acute findings in the chest, abdomen or pelvis. 4. Marked hepatic steatosis. Electronically Signed   By: Allegra Lai M.D.   On: 03/10/2023 19:59   CT Cervical Spine Wo Contrast  Result Date: 03/10/2023 CLINICAL DATA:  Trauma, headache  and weakness EXAM: CT HEAD WITHOUT CONTRAST CT CERVICAL SPINE WITHOUT CONTRAST TECHNIQUE: Multidetector CT imaging of the head and cervical spine was performed following the standard protocol without intravenous contrast. Multiplanar CT image reconstructions of the cervical spine were also generated. RADIATION DOSE REDUCTION: This exam was performed according to the departmental dose-optimization program which includes automated exposure control, adjustment of the mA and/or kV according to patient size and/or use of iterative reconstruction technique. COMPARISON:  None Available. FINDINGS: CT HEAD FINDINGS Brain: No evidence of acute infarction, hemorrhage, hydrocephalus, extra-axial collection or mass lesion/mass effect. Vascular: No hyperdense vessel or unexpected calcification. Skull: Normal. Negative for fracture or focal lesion. Sinuses/Orbits: No acute finding. Other: None. CT CERVICAL SPINE FINDINGS Alignment: Normal. Skull base and vertebrae: No acute fracture. No primary bone lesion or focal pathologic process. Soft tissues and spinal canal: No prevertebral fluid or swelling. No visible canal hematoma. Disc levels:  Intact. Upper chest: Negative. Other: Tracheostomy. IMPRESSION: 1. No acute intracranial pathology. 2. No fracture or static subluxation of the cervical spine. Electronically Signed   By: Jearld Lesch M.D.   On: 03/10/2023 15:08   CT Head Wo Contrast  Result Date: 03/10/2023 CLINICAL DATA:  Trauma, headache and weakness EXAM: CT HEAD WITHOUT CONTRAST CT CERVICAL SPINE WITHOUT CONTRAST TECHNIQUE: Multidetector CT imaging of the head and cervical spine was performed following the standard protocol without intravenous contrast. Multiplanar CT image reconstructions of the cervical spine were also generated. RADIATION DOSE REDUCTION: This exam was performed according to the departmental dose-optimization program which includes automated exposure control, adjustment of the mA and/or kV according  to patient size and/or use of iterative reconstruction technique. COMPARISON:  None Available. FINDINGS: CT HEAD FINDINGS Brain: No evidence of acute infarction, hemorrhage, hydrocephalus, extra-axial collection or mass lesion/mass effect. Vascular: No hyperdense vessel or unexpected calcification. Skull: Normal. Negative for fracture or focal lesion. Sinuses/Orbits: No acute finding. Other: None. CT CERVICAL SPINE FINDINGS Alignment: Normal. Skull base and vertebrae: No acute fracture. No primary bone lesion or focal pathologic process. Soft tissues and spinal canal: No prevertebral fluid or swelling. No visible canal hematoma. Disc levels:  Intact. Upper chest: Negative. Other: Tracheostomy. IMPRESSION: 1. No acute intracranial pathology. 2. No fracture or static subluxation of the cervical spine. Electronically Signed   By: Jearld Lesch M.D.   On: 03/10/2023 15:08    Pending Labs Unresulted Labs (From admission, onward)     Start     Ordered   03/11/23 0500  Comprehensive metabolic panel  Tomorrow morning,   R        03/10/23 2237   03/11/23 0500  Magnesium  Tomorrow morning,   R        03/10/23 2237   03/11/23 0500  CBC with Differential/Platelet  Tomorrow morning,   R        03/10/23 2237   03/11/23 0500  Phosphorus  Tomorrow morning,   R        03/10/23 2237   03/10/23 2238  Magnesium  Add-on,   AD        03/10/23 2237            Vitals/Pain Today's Vitals   03/10/23 2130 03/10/23 2245 03/10/23 2300 03/10/23 2315  BP: 132/76 137/89 (!) 147/75 136/69  Pulse: 90 (!) 101 91 88  Resp: 20 19 14 15   Temp:      TempSrc:      SpO2: 100% 100% 100% 100%  Weight:      Height:      PainSc:        Isolation Precautions No active isolations  Medications Medications  sodium chloride flush (NS) 0.9 % injection 3 mL (0 mLs Intravenous Hold 03/10/23 1454)  montelukast (SINGULAIR) tablet 10 mg (10 mg Oral Given 03/10/23 2107)  sodium chloride 0.9 % bolus 1,000 mL (0 mLs Intravenous  Stopped 03/10/23 2053)  acetaminophen (TYLENOL) tablet 650 mg (has no administration in time range)    Or  acetaminophen (TYLENOL) suppository 650 mg (has no administration in time range)  lactated ringers infusion ( Intravenous New Bag/Given 03/10/23 2241)  melatonin tablet 3 mg (has no administration in time range)  ondansetron (ZOFRAN) injection 4 mg (has no administration in time range)  insulin aspart (novoLOG) injection 0-9 Units (has no administration in time range)  naloxone (NARCAN) injection 0.4 mg (has no administration in time range)  HYDROmorphone (DILAUDID) injection 0.5 mg (has no administration in time range)  morphine (PF) 4 MG/ML injection 4 mg (4 mg Intravenous Given 03/10/23 1910)  iohexol (OMNIPAQUE) 350 MG/ML injection 75 mL (75 mLs Intravenous Contrast Given 03/10/23 1928)  morphine (PF) 4 MG/ML injection 6 mg (6 mg Intravenous Given 03/10/23 2050)  sodium chloride 0.9 % bolus 1,000 mL (1,000 mLs Intravenous New Bag/Given 03/10/23 2050)    Mobility walks with person assist     Focused Assessments Patient is alert and oriented.  Using bedpan as needed currently.    R Recommendations: See Admitting Provider Note  Report given to:   Additional Notes: .

## 2023-03-10 NOTE — ED Provider Notes (Signed)
[place] EMERGENCY DEPARTMENT AT [facility] Provider Note   CSN: [id number] Arrival date & time: 03/10/23  1232     History  Chief Complaint  Patient presents with   Loss of Consciousness   Headache   Weakness    [name] is a 52 y.o. female.  Patient is a 52 year old female with past medical history of hypertension, hyperlipidemia, morbid obesity, neuromuscular disorder, sleep apnea, GERD, COPD, diabetes, status post tracheostomy presenting for complaints of right upper extremity and right lower extremity weakness.  Patient states she had a syncopal episode 3 days ago in which she was sitting on the edge of her bed, felt dizzy, woke up on the ground. Denies hx of seizure disorder. Currently she reports having a headache.  Admits to right upper and right lower extremity weakness as compared to the left over the last 3 days.  She admits to perineal numbness.  She admits to decreased urine production despite hydration.  She has a history of CKD but no known renal failure at this time.  She denies a prior history of a stroke.  Denies prior history of blood thinner use.  Denies prior history of aortic aneurysms or dissections.  Admits to chronic low back pain but denies history of any spinal cord impingement or cauda equina syndrome.  The history is provided by the patient. No language interpreter was used.  Loss of Consciousness Associated symptoms: headaches and weakness   Associated symptoms: no chest pain, no fever, no palpitations, no seizures, no shortness of breath and no vomiting   Headache Associated symptoms: numbness, syncope and weakness   Associated symptoms: no abdominal pain, no back pain, no cough, no ear pain, no eye pain, no fever, no seizures, no sore throat and no vomiting   Weakness Associated symptoms: headaches and syncope   Associated symptoms: no abdominal pain, no arthralgias, no chest pain, no cough, no dysuria, no fever, no  seizures, no shortness of breath and no vomiting        Home Medications Prior to Admission medications   Medication Sig Start Date End Date Taking? Authorizing Provider  albuterol (PROVENTIL) (2.5 MG/3ML) 0.083% nebulizer solution Take 3 mLs (2.5 mg total) by nebulization every 6 (six) hours as needed for wheezing or shortness of breath. 12/26/22   Hoy Register, MD  albuterol (VENTOLIN HFA) 108 (90 Base) MCG/ACT inhaler INHALE 2 PUFFS BY MOUTH EVERY 6 HOURS AS NEEDED FOR WHEEZING FOR SHORTNESS OF BREATH 02/03/23   Hoy Register, MD  amitriptyline (ELAVIL) 10 MG tablet TAKE 1 TABLET BY MOUTH AT BEDTIME (DISCONTINUE  TOPAMAX) 12/08/22   Hoy Register, MD  amLODipine (NORVASC) 5 MG tablet Take 1 tablet (5 mg total) by mouth daily. 01/22/23 04/22/23  Meriam Sprague, MD  Blood Glucose Monitoring Suppl (ONETOUCH VERIO FLEX SYSTEM) w/Device KIT Use to check glucose levels daily; E11.9 01/09/23   Hoy Register, MD  buPROPion (WELLBUTRIN SR) 150 MG 12 hr tablet Take 1 tablet (150 mg total) by mouth 2 (two) times daily. 01/15/23   Hoy Register, MD  cetirizine (ZYRTEC) 10 MG tablet TAKE 1 TABLET BY MOUTH AT BEDTIME Patient taking differently: Take 10 mg by mouth at bedtime. 12/10/21   Charlott Holler, MD  chlorpheniramine-HYDROcodone (TUSSIONEX) 10-8 MG/5ML Take 5 mLs by mouth 2 (two) times daily. 09/02/22   Hoy Register, MD  Continuous Blood Gluc Receiver (FREESTYLE LIBRE 2 READER) DEVI USE TO CHECK BLOOD SUGAR THREE TIMES DAILY. 12/06/22   Hoy Register, MD  Continuous Glucose Sensor (FREESTYLE LIBRE 2 SENSOR) MISC USE TO CHECK BLOOD GLUCOSE THREE TIMES DAILY.  CHANGE SENSOR ONCE EVERY 14 DAYS. 02/03/23   Hoy Register, MD  dapagliflozin propanediol (FARXIGA) 10 MG TABS tablet Take 1 tablet (10 mg total) by mouth daily before breakfast. 01/15/23   Hoy Register, MD  diclofenac Sodium (VOLTAREN) 1 % GEL Apply 4 g topically 4 (four) times daily. 11/27/20   Hoy Register, MD  furosemide  (LASIX) 20 MG tablet Take 1 tablet (20 mg total) by mouth daily. 02/02/23   Meriam Sprague, MD  glucose blood (ONETOUCH VERIO) test strip Use to check glucose levels daily; E11.9 01/09/23   Hoy Register, MD  hydrOXYzine (ATARAX) 25 MG tablet Take 1 tablet (25 mg total) by mouth 3 (three) times daily as needed. for anxiety 01/15/23   Hoy Register, MD  insulin degludec (TRESIBA FLEXTOUCH) 200 UNIT/ML FlexTouch Pen Inject 170 Units into the skin daily. 03/10/23   Hoy Register, MD  insulin lispro (HUMALOG KWIKPEN) 100 UNIT/ML KwikPen Inject 10 Units into the skin 3 (three) times daily. 01/15/23   Hoy Register, MD  Insulin Pen Needle 31G X 8 MM MISC Use to inject [place] and Humalog. Pt may use 4 pen needles daily. 11/07/22   Hoy Register, MD  levofloxacin (LEVAQUIN) 500 MG tablet Take 1 tablet (500 mg total) by mouth daily. 02/13/23   Glenford Bayley, NP  lisinopril (ZESTRIL) 20 MG tablet Take 1 tablet (20 mg total) by mouth daily. 01/15/23   Hoy Register, MD  melatonin 3 MG TABS tablet Take 1 tablet (3 mg total) by mouth at bedtime. 06/20/22   Hoy Register, MD  Misc. Devices MISC Nebulizer device. Diagnosis - Asthma 07/16/20   Hoy Register, MD  Misc. Devices MISC Shower chair 04/03/21   Hoy Register, MD  mometasone-formoterol (DULERA) 200-5 MCG/ACT AERO Inhale 2 puffs into the lungs in the morning and at bedtime. 01/15/23   Hoy Register, MD  montelukast (SINGULAIR) 10 MG tablet Take 1 tablet (10 mg total) by mouth at bedtime. 01/15/23   Hoy Register, MD  ondansetron (ZOFRAN) 4 MG tablet TAKE 1 TABLET BY MOUTH EVERY 8 HOURS AS NEEDED FOR NAUSEA FOR VOMITING 02/24/23   Hoy Register, MD  OneTouch Delica Lancets 33G MISC Use to check glucose levels daily; E11.9 01/09/23   Hoy Register, MD  oxyCODONE-acetaminophen (PERCOCET) 10-325 MG tablet Take 1 tablet by mouth 5 (five) times daily as needed. 11/22/21   [provider]  pantoprazole (PROTONIX) 40 MG tablet Take 1  tablet (40 mg total) by mouth 2 (two) times daily. 01/15/23   Hoy Register, MD  pregabalin (LYRICA) 50 MG capsule Take 1 capsule (50 mg total) by mouth in the morning, at noon, and at bedtime. 10/14/22   Eldred Manges, MD  rosuvastatin (CRESTOR) 20 MG tablet Take 1 tablet by mouth once daily 01/21/23   Hoy Register, MD  sertraline (ZOLOFT) 100 MG tablet Take 1 tablet (100 mg total) by mouth at bedtime. 01/15/23   Hoy Register, MD  spironolactone (ALDACTONE) 25 MG tablet Take 1 tablet (25 mg total) by mouth daily. 01/22/23   Meriam Sprague, MD  tirzepatide [facility]) 10 MG/0.5ML Pen Inject 10 mg into the skin once a week. 01/19/23   Hoy Register, MD  tirzepatide [facility]) 5 MG/0.5ML Pen Inject 5 mg into the skin once a week. For 4 weeks then increase to 7.5 mg once a week 01/19/23   Hoy Register, MD  tirzepatide [facility]) 7.5  MG/0.5ML Pen Inject 7.5 mg into the skin once a week. For 4 weeks then increase to 10 mg once a week 01/19/23   Hoy Register, MD  tiZANidine (ZANAFLEX) 4 MG tablet TAKE 1 TABLET BY MOUTH EVERY 8 HOURS AS NEEDED FOR MUSCLE SPASM 02/24/23   Hoy Register, MD  Vitamin D, Ergocalciferol, (DRISDOL) 1.25 MG (50000 UNIT) CAPS capsule Take 1 capsule (50,000 Units total) by mouth every 7 (seven) days. 07/07/22   Kerrin Champagne, MD  fluticasone-salmeterol (ADVAIR HFA) [phone] MCG/ACT inhaler Inhale 2 puffs into the lungs 2 (two) times daily. 10/15/17 10/15/17  Hoy Register, MD      Allergies    Reglan [metoclopramide]    Review of Systems   Review of Systems  Constitutional:  Negative for chills and fever.  HENT:  Negative for ear pain and sore throat.   Eyes:  Negative for pain and visual disturbance.  Respiratory:  Negative for cough and shortness of breath.   Cardiovascular:  Positive for syncope. Negative for chest pain and palpitations.  Gastrointestinal:  Negative for abdominal pain and vomiting.  Genitourinary:  Positive for decreased urine volume and flank  pain. Negative for dysuria and hematuria.  Musculoskeletal:  Negative for arthralgias and back pain.  Skin:  Negative for color change and rash.  Neurological:  Positive for syncope, weakness, numbness and headaches. Negative for seizures.  All other systems reviewed and are negative.   Physical Exam Updated Vital Signs BP (!) 145/79   Pulse 95   Temp 99.3 F (37.4 C) (Oral)   Resp 19   Ht 5\' 1"  (1.549 m)   Wt 120.7 kg   LMP  (LMP Unknown)   SpO2 100%   BMI 50.26 kg/m  Physical Exam Vitals and nursing note reviewed.  Constitutional:      General: She is not in acute distress.    Appearance: She is well-developed.  HENT:     Head: Normocephalic and atraumatic.  Eyes:     Conjunctiva/sclera: Conjunctivae normal.  Cardiovascular:     Rate and Rhythm: Normal rate and regular rhythm.     Heart sounds: No murmur heard. Pulmonary:     Effort: Pulmonary effort is normal. No respiratory distress.     Breath sounds: Normal breath sounds.  Abdominal:     Palpations: Abdomen is soft.     Tenderness: There is no abdominal tenderness.  Musculoskeletal:        General: No swelling.     Cervical back: Neck supple.  Skin:    General: Skin is warm and dry.     Capillary Refill: Capillary refill takes less than 2 seconds.  Neurological:     Mental Status: She is alert and oriented to person, place, and time.     GCS: GCS eye subscore is 4. GCS verbal subscore is 5. GCS motor subscore is 6.     Cranial Nerves: Cranial nerves 2-12 are intact.     Sensory: No sensory deficit.     Motor: Weakness present.     Comments: Decreased grip strength and limb drift on right compared to left.  Sensation intact.  No cranial nerve deficits.  Psychiatric:        Mood and Affect: Mood normal.     ED Results / Procedures / Treatments   Labs (all labs ordered are listed, but only abnormal results are displayed) Labs Reviewed  CBC - Abnormal; Notable for the following components:      Result  Value  Hemoglobin 11.3 (*)    HCT 35.7 (*)    All other components within normal limits  DIFFERENTIAL - Abnormal; Notable for the following components:   Lymphs Abs 4.5 (*)    All other components within normal limits  COMPREHENSIVE METABOLIC PANEL - Abnormal; Notable for the following components:   CO2 21 (*)    Glucose, Bld 154 (*)    BUN 52 (*)    Creatinine, Ser 4.22 (*)    AST 55 (*)    GFR, Estimated 12 (*)    All other components within normal limits  URINALYSIS, ROUTINE W REFLEX MICROSCOPIC - Abnormal; Notable for the following components:   APPearance HAZY (*)    Leukocytes,Ua MODERATE (*)    Bacteria, UA RARE (*)    All other components within normal limits  I-STAT CHEM 8, ED - Abnormal; Notable for the following components:   BUN 48 (*)    Creatinine, Ser 4.40 (*)    Glucose, Bld 152 (*)    Calcium, Ion 1.11 (*)    All other components within normal limits  CBG MONITORING, ED - Abnormal; Notable for the following components:   Glucose-Capillary 115 (*)    All other components within normal limits  PROTIME-INR  APTT  ETHANOL  NA AND K (SODIUM & POTASSIUM), RAND UR  TROPONIN I (HIGH SENSITIVITY)  TROPONIN I (HIGH SENSITIVITY)    EKG None  Radiology MR BRAIN WO CONTRAST  Result Date: 03/10/2023 CLINICAL DATA:  Acute neurologic deficit EXAM: MRI HEAD WITHOUT CONTRAST TECHNIQUE: Multiplanar, multiecho pulse sequences of the brain and surrounding structures were obtained without intravenous contrast. COMPARISON:  None Available. FINDINGS: Brain: No acute infarct, mass effect or extra-axial collection. No acute or chronic hemorrhage. Normal white matter signal, parenchymal volume and CSF spaces. The midline structures are normal. Vascular: Major flow voids are preserved. Skull and upper cervical spine: Normal calvarium and skull base. Visualized upper cervical spine and soft tissues are normal. Sinuses/Orbits:No paranasal sinus fluid levels or advanced mucosal  thickening. No mastoid or middle ear effusion. Normal orbits. IMPRESSION: Normal brain MRI. Electronically Signed   By: Deatra Robinson M.D.   On: 03/10/2023 20:09   CT Angio Chest/Abd/Pel for Dissection W and/or Wo Contrast  Result Date: 03/10/2023 CLINICAL DATA:  Acute aortic syndrome suspected EXAM: CT ANGIOGRAPHY CHEST, ABDOMEN AND PELVIS TECHNIQUE: Non-contrast CT of the chest was initially obtained. Multidetector CT imaging through the chest, abdomen and pelvis was performed using the standard protocol during bolus administration of intravenous contrast. Multiplanar reconstructed images and MIPs were obtained and reviewed to evaluate the vascular anatomy. RADIATION DOSE REDUCTION: This exam was performed according to the departmental dose-optimization program which includes automated exposure control, adjustment of the mA and/or kV according to patient size and/or use of iterative reconstruction technique. CONTRAST:  75mL OMNIPAQUE IOHEXOL 350 MG/ML SOLN COMPARISON:  Multiple priors, most recent chest CT dated Feb 11, 2023. FINDINGS: CTA CHEST FINDINGS Cardiovascular: Normal heart size. No pericardial effusion. No evidence of central pulmonary embolus, evaluation of the segmental and subsegmental pulmonary arteries is somewhat limited due to bolus timing. No atherosclerotic disease. Mediastinum/Nodes: Esophagus thyroid are unremarkable. Area of right paratracheal low-attenuation seen on series 6, image 32 is unchanged when compared multiple priors and likely pericardial recess fluid. No pathologically enlarged lymph nodes seen in the chest. Lungs/Pleura: Tracheostomy tube in place. Central airways are patent. No consolidation, pleural effusion or pneumothorax. Musculoskeletal: No chest wall abnormality. No acute or significant osseous findings. Review of the  MIP images confirms the above findings. CTA ABDOMEN AND PELVIS FINDINGS VASCULAR Aorta: Limited evaluation of the ascending thoracic aorta due to  cardiac motion. Within limitations, normal caliber thoracic aorta. Arch vessels are patent. Normal caliber abdominal aorta. Minimal atherosclerotic disease. No evidence of dissection or intramural hematoma. Celiac: Patent without evidence of aneurysm, dissection, vasculitis or significant stenosis. SMA: Patent without evidence of aneurysm, dissection, vasculitis or significant stenosis. Renals: Both renal arteries are patent without evidence of aneurysm, dissection, vasculitis, fibromuscular dysplasia or significant stenosis. IMA: Patent without evidence of aneurysm, dissection, vasculitis or significant stenosis. Inflow: Patent without evidence of aneurysm, dissection, vasculitis or significant stenosis. Veins: No obvious venous abnormality within the limitations of this arterial phase study. Review of the MIP images confirms the above findings. NON-VASCULAR Hepatobiliary: Hepatic steatosis. Prior cholecystectomy. No suspicious liver lesion. No biliary ductal dilation. Pancreas: Unremarkable. No pancreatic ductal dilatation or surrounding inflammatory changes. Spleen: Normal in size without focal abnormality. Adrenals/Urinary Tract: Bilateral adrenal glands are unremarkable. No hydronephrosis or nephrolithiasis. Bladder is unremarkable. Stomach/Bowel: Stomach is within normal limits. No evidence of bowel wall thickening, distention, or inflammatory changes. Lymphatic: No enlarged lymph nodes seen in the abdomen or pelvis. Reproductive: No adnexal mass. Other: No abdominal wall hernia or abnormality. No abdominopelvic ascites. Musculoskeletal: Posterior fusion hardware of L4-L5. No aggressive appearing osseous lesions. Review of the MIP images confirms the above findings. IMPRESSION: 1. Normal caliber abdominal aorta with no evidence of acute aortic syndrome. 2. No evidence of central pulmonary embolus. Evaluation of the segmental and subsegmental pulmonary arteries is somewhat limited due to bolus timing. 3. No  acute findings in the chest, abdomen or pelvis. 4. Marked hepatic steatosis. Electronically Signed   By: Allegra Lai M.D.   On: 03/10/2023 19:59   CT Cervical Spine Wo Contrast  Result Date: 03/10/2023 CLINICAL DATA:  Trauma, headache and weakness EXAM: CT HEAD WITHOUT CONTRAST CT CERVICAL SPINE WITHOUT CONTRAST TECHNIQUE: Multidetector CT imaging of the head and cervical spine was performed following the standard protocol without intravenous contrast. Multiplanar CT image reconstructions of the cervical spine were also generated. RADIATION DOSE REDUCTION: This exam was performed according to the departmental dose-optimization program which includes automated exposure control, adjustment of the mA and/or kV according to patient size and/or use of iterative reconstruction technique. COMPARISON:  None Available. FINDINGS: CT HEAD FINDINGS Brain: No evidence of acute infarction, hemorrhage, hydrocephalus, extra-axial collection or mass lesion/mass effect. Vascular: No hyperdense vessel or unexpected calcification. Skull: Normal. Negative for fracture or focal lesion. Sinuses/Orbits: No acute finding. Other: None. CT CERVICAL SPINE FINDINGS Alignment: Normal. Skull base and vertebrae: No acute fracture. No primary bone lesion or focal pathologic process. Soft tissues and spinal canal: No prevertebral fluid or swelling. No visible canal hematoma. Disc levels:  Intact. Upper chest: Negative. Other: Tracheostomy. IMPRESSION: 1. No acute intracranial pathology. 2. No fracture or static subluxation of the cervical spine. Electronically Signed   By: Jearld Lesch M.D.   On: 03/10/2023 15:08   CT Head Wo Contrast  Result Date: 03/10/2023 CLINICAL DATA:  Trauma, headache and weakness EXAM: CT HEAD WITHOUT CONTRAST CT CERVICAL SPINE WITHOUT CONTRAST TECHNIQUE: Multidetector CT imaging of the head and cervical spine was performed following the standard protocol without intravenous contrast. Multiplanar CT image  reconstructions of the cervical spine were also generated. RADIATION DOSE REDUCTION: This exam was performed according to the departmental dose-optimization program which includes automated exposure control, adjustment of the mA and/or kV according to patient size and/or use of  iterative reconstruction technique. COMPARISON:  None Available. FINDINGS: CT HEAD FINDINGS Brain: No evidence of acute infarction, hemorrhage, hydrocephalus, extra-axial collection or mass lesion/mass effect. Vascular: No hyperdense vessel or unexpected calcification. Skull: Normal. Negative for fracture or focal lesion. Sinuses/Orbits: No acute finding. Other: None. CT CERVICAL SPINE FINDINGS Alignment: Normal. Skull base and vertebrae: No acute fracture. No primary bone lesion or focal pathologic process. Soft tissues and spinal canal: No prevertebral fluid or swelling. No visible canal hematoma. Disc levels:  Intact. Upper chest: Negative. Other: Tracheostomy. IMPRESSION: 1. No acute intracranial pathology. 2. No fracture or static subluxation of the cervical spine. Electronically Signed   By: Jearld Lesch M.D.   On: 03/10/2023 15:08    Procedures .Critical Care  Performed by: Franne Forts, DO Authorized by: Franne Forts, DO   Critical care provider statement:    Critical care time (minutes):  95   Critical care was necessary to treat or prevent imminent or life-threatening deterioration of the following conditions: stroke like symptoms.   Critical care was time spent personally by me on the following activities:  Development of treatment plan with patient or surrogate, discussions with consultants, evaluation of patient's response to treatment, examination of patient, ordering and review of laboratory studies, ordering and review of radiographic studies, ordering and performing treatments and interventions, pulse oximetry, re-evaluation of patient's condition and review of old charts   Care discussed with comment:   Neurology consult, nephrology consult     Medications Ordered in ED Medications  sodium chloride flush (NS) 0.9 % injection 3 mL (0 mLs Intravenous Hold 03/10/23 1454)  montelukast (SINGULAIR) tablet 10 mg (10 mg Oral Given 03/10/23 2107)  sodium chloride 0.9 % bolus 1,000 mL (0 mLs Intravenous Stopped 03/10/23 2053)  morphine (PF) 4 MG/ML injection 4 mg (4 mg Intravenous Given 03/10/23 1910)  iohexol (OMNIPAQUE) 350 MG/ML injection 75 mL (75 mLs Intravenous Contrast Given 03/10/23 1928)  morphine (PF) 4 MG/ML injection 6 mg (6 mg Intravenous Given 03/10/23 2050)  sodium chloride 0.9 % bolus 1,000 mL (1,000 mLs Intravenous New Bag/Given 03/10/23 2050)    ED Course/ Medical Decision Making/ A&P Clinical Course as of 03/10/23 2128  Tue Mar 10, 2023  1708 Patient presenting for syncope, right sided UE and LE numbness, and perineal numbness. Labs concerning for AKI versus acute renal failure with GFR of 12. I spoke with nephrologist Dr. Verna Czech regaurding the complexity of her presenting symptoms and we agree that a CT dissection with contrast is necessary at this time regardless of kidney function to rule out any acute life threatening illness.  [AG]  1852 CTH demonstrates no acute process. MRI pending to r/o stroke due to neuro deficits.  [AG]  1852 Ct dissection pending [AG]  2025 MRI brain normal CT demonstrates no dissection [AG]    Clinical Course User Index [AG] Franne Forts, DO                             Medical Decision Making Amount and/or Complexity of Data Reviewed Labs: ordered. Radiology: ordered.  Risk Prescription drug management. Decision regarding hospitalization.   52 year old female with tracheostomy in many chronic core morbidities presenting for multiple complaints including syncope 3 days ago, progressive right upper and right lower extremity weakness, perineal numbness, headache, flank pain, and decreased urine production.  Differential diagnosis includes  but is not limited to stroke, dissection, cervical cord trauma, seizure, polypharmancy, etc.  CT head demonstrates no acute hemorrhage CT cervical spine demonstrates no acute pathologies.  No cervical spine fractures. MRI brain demonstrates no acute stroke. CTA a chest abdomen pelvis demonstrates no aortic dissection.  No acute process.  Laboratory studies concerning for acute AKI versus acute renal failure.  IV fluids given 2 L while in ED. Patient is still making urine at this time.  Creatinine has increased from 4.4 from 1.5 recorded one month ago.  History of progressively decreasing urine over the past several months.  UA demonstrates no urinary tract infection.  No signs or symptoms of sepsis.  Stable electrolytes.  Stable potassium at 5.  Urine lites ordered.  Nephrology consulted and agreeable to see patient on admission.  Neurology consulted for strange neurological symptoms. Will defer to neurology for their specialty recommendations considering the amount of scans the patient received today.    Neurology recommend MR c-spine. Requesting EEG. Orders placed. Patient signed out to oncoming provider while awaiting MRI c-spine.            Final Clinical Impression(s) / ED Diagnoses Final diagnoses:  AKI (acute kidney injury) (HCC)  Upper extremity weakness  Weakness of right upper extremity  Perineal numbness  Nonintractable headache, unspecified chronicity pattern, unspecified headache type  History of fall  Decreased urine output    Rx / DC Orders ED Discharge Orders     None         Franne Forts, DO 03/15/23 1319

## 2023-03-10 NOTE — H&P (Signed)
History and Physical      [name] YNW:[id number] DOB: March 08, 1971 DOA: 03/10/2023; DOS: 03/10/2023  PCP: Hoy Register, MD *** Patient coming from: home ***  I have personally briefly reviewed patient's old medical records in [facility]  Chief Complaint: ***  HPI: [name] is a 52 y.o. female with medical history significant for *** who is admitted to [facility] on 03/10/2023 with *** after presenting from home*** to [facility] ED complaining of ***.    ***       ***   ED Course:  Vital signs in the ED were notable for the following: ***  Labs were notable for the following: ***  Per my interpretation, EKG in ED demonstrated the following:  ***  Imaging and additional notable ED work-up: ***  EDP discussed with on-call nephrology, Dr. Marisue Humble, who will formally consult and see the patient in the morning.  Additionally, the patient had an episode of syncope with prodrome 3 days ago resulting in a fall to the floor.  She has had ensuing right-sided weakness as well as perineal numbness. MRI brain negative. EDP d/w on-call neuro (Dr. Derry Lory), who is consulting and recommended MRI of cervical spine (pending), but no imaging of the low back. Neuro also recommends EEG.   While in the ED, the following were administered: ***  Subsequently, the patient was admitted  ***  ***red    Review of Systems: As per HPI otherwise 10 point review of systems negative.   Past Medical History:  Diagnosis Date   Abnormal UGI series    Acute pain of right shoulder due to trauma 10/05/2018   Acute respiratory failure with hypoxia and hypercapnia (HCC) 10/23/2018   Allergy    Anemia    Anxiety    Arthritis    Asthma    Chest pain of uncertain etiology 03/22/2019   Chronic back pain    Chronic chest pain    resolved, no problems since 2019 per patient 07/27/19   Coffee ground vomiting 10/14/2021   COPD (chronic obstructive  pulmonary disease) (HCC)    inhaler   Depression    DM (diabetes mellitus) (HCC)    INSULIN DEPENDENT - TYPE 1   Dyspnea    GERD (gastroesophageal reflux disease)    Headache(784.0)    History of hiatal hernia    Hyperlipidemia    no med, diet controlled   Hypertension    Hypokalemia    Klebsiella cystitis 03/14/2017   Last Assessment & Plan:   Formatting of this note might be different from the original.  - On Zosyn  - Urine culture: Klebsiella pneumoniae   Neuromuscular disorder (HCC)    Osteoarthritis    Oxygen deficiency    Pneumonia    Respiratory disease 05/2017   uses inhalers, neb tx prn, no oxygen   Rotator cuff tear 07/28/2019   Sleep apnea    does not use CPAP due to trach, uses 2L supplement oxygen at night   Status post tracheostomy (HCC) 03/14/2017   Last Assessment & Plan:  Formatting of this note might be different from the original. - VERY CRITICAL AIRWAY WITH EXTREME PRECUATION - On tach - ENT following along (please see above for current trach management plan)   Tracheostomy in place [facility]) 02/2017    Past Surgical History:  Procedure Laterality Date   ABDOMINAL HYSTERECTOMY  2009   APPENDECTOMY     BIOPSY  10/15/2021   Procedure: BIOPSY;  Surgeon: Tressia Danas,  MD;  Location: [facility] ENDOSCOPY;  Service: Gastroenterology;;   CESAREAN SECTION     x 3   CHOLECYSTECTOMY N/A 03/02/2014   Procedure: LAPAROSCOPIC CHOLECYSTECTOMY;  Surgeon: Clovis Pu. Cornett, MD;  Location: [facility] OR;  Service: General;  Laterality: N/A;   ESOPHAGOGASTRODUODENOSCOPY (EGD) WITH PROPOFOL N/A 10/15/2021   Procedure: ESOPHAGOGASTRODUODENOSCOPY (EGD) WITH PROPOFOL;  Surgeon: Tressia Danas, MD;  Location: [facility] ENDOSCOPY;  Service: Gastroenterology;  Laterality: N/A;   HERNIA REPAIR     PANENDOSCOPY N/A 03/04/2017   Procedure: PANENDOSCOPY WITH POSSIBLE FOREIGN BODY REMOVAL;  Surgeon: Flo Shanks, MD;  Location: [facility] OR;  Service: ENT;  Laterality: N/A;   ROTATOR CUFF REPAIR Left     ROTATOR CUFF REPAIR Right 07/27/2019   SHOULDER ARTHROSCOPY WITH SUBACROMIAL DECOMPRESSION, ROTATOR CUFF REPAIR AND BICEP TENDON REPAIR Right 07/28/2019   Procedure: RIGHT SHOULDER ARTHROSCOP, MINI OPEN ROTATOR CUFF TEAR REPAIR,  BICEPS TENODESIS, DISTAL CLAVICLE EXCISION;  Surgeon: Cammy Copa, MD;  Location: [facility] OR;  Service: Orthopedics;  Laterality: Right;   SPINE SURGERY     TOOTH EXTRACTION N/A 02/22/2021   Procedure: DENTAL RESTORATION/EXTRACTIONS;  Surgeon: Ocie Doyne, DMD;  Location: [facility] OR;  Service: Oral Surgery;  Laterality: N/A;   TRACHEOSTOMY  02/2017   TUBAL LIGATION     VESICOVAGINAL FISTULA CLOSURE W/ TAH  2009    Social History:  reports that she quit smoking about 7 years ago. Her smoking use included cigarettes. She has a 12.50 pack-year smoking history. She has never used smokeless tobacco. She reports current alcohol use of about 1.0 standard drink of alcohol per week. She reports that she does not use drugs.   Allergies  Allergen Reactions   Reglan [Metoclopramide] Other (See Comments)    Panic attack    Family History  Problem Relation Age of Onset   Heart attack Mother    Stroke Mother    Diabetes Mother    Hypertension Mother    Arthritis Mother    Stroke Father    Asthma Sister    Hypertension Sister    Asthma Sister    Diabetes Sister    Asthma Brother    Seizures Brother    Asthma Brother    Diabetes Brother    Asthma Daughter    Asthma Son    Asthma Grandson    Colon cancer Neg Hx    Rectal cancer Neg Hx    Stomach cancer Neg Hx    Esophageal cancer Neg Hx    Ovarian cancer Neg Hx    Pancreatic cancer Neg Hx     Family history reviewed and not pertinent ***   Prior to Admission medications   Medication Sig Start Date End Date Taking? Authorizing Provider  albuterol (PROVENTIL) (2.5 MG/3ML) 0.083% nebulizer solution Take 3 mLs (2.5 mg total) by nebulization every 6 (six) hours as needed for wheezing or shortness of breath.  12/26/22   Hoy Register, MD  albuterol (VENTOLIN HFA) 108 (90 Base) MCG/ACT inhaler INHALE 2 PUFFS BY MOUTH EVERY 6 HOURS AS NEEDED FOR WHEEZING FOR SHORTNESS OF BREATH 02/03/23   Hoy Register, MD  amitriptyline (ELAVIL) 10 MG tablet TAKE 1 TABLET BY MOUTH AT BEDTIME (DISCONTINUE  TOPAMAX) 12/08/22   Hoy Register, MD  amLODipine (NORVASC) 5 MG tablet Take 1 tablet (5 mg total) by mouth daily. 01/22/23 04/22/23  Meriam Sprague, MD  Blood Glucose Monitoring Suppl (ONETOUCH VERIO FLEX SYSTEM) w/Device KIT Use to check glucose levels daily; E11.9 01/09/23   Hoy Register, MD  buPROPion (WELLBUTRIN SR) 150 MG 12 hr tablet Take 1 tablet (150 mg total) by mouth 2 (two) times daily. 01/15/23   Hoy Register, MD  cetirizine (ZYRTEC) 10 MG tablet TAKE 1 TABLET BY MOUTH AT BEDTIME Patient taking differently: Take 10 mg by mouth at bedtime. 12/10/21   Charlott Holler, MD  chlorpheniramine-HYDROcodone (TUSSIONEX) 10-8 MG/5ML Take 5 mLs by mouth 2 (two) times daily. 09/02/22   Hoy Register, MD  Continuous Blood Gluc Receiver (FREESTYLE LIBRE 2 READER) DEVI USE TO CHECK BLOOD SUGAR THREE TIMES DAILY. 12/06/22   Hoy Register, MD  Continuous Glucose Sensor (FREESTYLE LIBRE 2 SENSOR) MISC USE TO CHECK BLOOD GLUCOSE THREE TIMES DAILY.  CHANGE SENSOR ONCE EVERY 14 DAYS. 02/03/23   Hoy Register, MD  dapagliflozin propanediol (FARXIGA) 10 MG TABS tablet Take 1 tablet (10 mg total) by mouth daily before breakfast. 01/15/23   Hoy Register, MD  diclofenac Sodium (VOLTAREN) 1 % GEL Apply 4 g topically 4 (four) times daily. 11/27/20   Hoy Register, MD  furosemide (LASIX) 20 MG tablet Take 1 tablet (20 mg total) by mouth daily. 02/02/23   Meriam Sprague, MD  glucose blood (ONETOUCH VERIO) test strip Use to check glucose levels daily; E11.9 01/09/23   Hoy Register, MD  hydrOXYzine (ATARAX) 25 MG tablet Take 1 tablet (25 mg total) by mouth 3 (three) times daily as needed. for anxiety 01/15/23   Hoy Register, MD  insulin degludec (TRESIBA FLEXTOUCH) 200 UNIT/ML FlexTouch Pen Inject 170 Units into the skin daily. 03/10/23   Hoy Register, MD  insulin lispro (HUMALOG KWIKPEN) 100 UNIT/ML KwikPen Inject 10 Units into the skin 3 (three) times daily. 01/15/23   Hoy Register, MD  Insulin Pen Needle 31G X 8 MM MISC Use to inject [place] and Humalog. Pt may use 4 pen needles daily. 11/07/22   Hoy Register, MD  levofloxacin (LEVAQUIN) 500 MG tablet Take 1 tablet (500 mg total) by mouth daily. 02/13/23   Glenford Bayley, NP  lisinopril (ZESTRIL) 20 MG tablet Take 1 tablet (20 mg total) by mouth daily. 01/15/23   Hoy Register, MD  melatonin 3 MG TABS tablet Take 1 tablet (3 mg total) by mouth at bedtime. 06/20/22   Hoy Register, MD  Misc. Devices MISC Nebulizer device. Diagnosis - Asthma 07/16/20   Hoy Register, MD  Misc. Devices MISC Shower chair 04/03/21   Hoy Register, MD  mometasone-formoterol (DULERA) 200-5 MCG/ACT AERO Inhale 2 puffs into the lungs in the morning and at bedtime. 01/15/23   Hoy Register, MD  montelukast (SINGULAIR) 10 MG tablet Take 1 tablet (10 mg total) by mouth at bedtime. 01/15/23   Hoy Register, MD  ondansetron (ZOFRAN) 4 MG tablet TAKE 1 TABLET BY MOUTH EVERY 8 HOURS AS NEEDED FOR NAUSEA FOR VOMITING 02/24/23   Hoy Register, MD  OneTouch Delica Lancets 33G MISC Use to check glucose levels daily; E11.9 01/09/23   Hoy Register, MD  oxyCODONE-acetaminophen (PERCOCET) 10-325 MG tablet Take 1 tablet by mouth 5 (five) times daily as needed. 11/22/21   [provider]  pantoprazole (PROTONIX) 40 MG tablet Take 1 tablet (40 mg total) by mouth 2 (two) times daily. 01/15/23   Hoy Register, MD  pregabalin (LYRICA) 50 MG capsule Take 1 capsule (50 mg total) by mouth in the morning, at noon, and at bedtime. 10/14/22   Eldred Manges, MD  rosuvastatin (CRESTOR) 20 MG tablet Take 1 tablet by mouth once daily 01/21/23   Hoy Register, MD  sertraline (ZOLOFT) 100 MG  tablet Take 1 tablet (100 mg total) by mouth at bedtime. 01/15/23   Hoy Register, MD  spironolactone (ALDACTONE) 25 MG tablet Take 1 tablet (25 mg total) by mouth daily. 01/22/23   Meriam Sprague, MD  tirzepatide [facility]) 10 MG/0.5ML Pen Inject 10 mg into the skin once a week. 01/19/23   Hoy Register, MD  tirzepatide [facility]) 5 MG/0.5ML Pen Inject 5 mg into the skin once a week. For 4 weeks then increase to 7.5 mg once a week 01/19/23   Hoy Register, MD  tirzepatide [facility]) 7.5 MG/0.5ML Pen Inject 7.5 mg into the skin once a week. For 4 weeks then increase to 10 mg once a week 01/19/23   Hoy Register, MD  tiZANidine (ZANAFLEX) 4 MG tablet TAKE 1 TABLET BY MOUTH EVERY 8 HOURS AS NEEDED FOR MUSCLE SPASM 02/24/23   Hoy Register, MD  Vitamin D, Ergocalciferol, (DRISDOL) 1.25 MG (50000 UNIT) CAPS capsule Take 1 capsule (50,000 Units total) by mouth every 7 (seven) days. 07/07/22   Kerrin Champagne, MD  fluticasone-salmeterol (ADVAIR HFA) [phone] MCG/ACT inhaler Inhale 2 puffs into the lungs 2 (two) times daily. 10/15/17 10/15/17  Hoy Register, MD     Objective    Physical Exam: Vitals:   03/10/23 1930 03/10/23 2030 03/10/23 2100 03/10/23 2130  BP: (!) 141/68 135/70 (!) 145/79 132/76  Pulse: 95 88 95 90  Resp: 14 15 19 20   Temp:      TempSrc:      SpO2: 100% 100% 100% 100%  Weight:      Height:        General: appears to be stated age; alert, oriented Skin: warm, dry, no rash Head:  AT/[place] Mouth:  Oral mucosa membranes appear moist, normal dentition Neck: supple; trachea midline Heart:  RRR; did not appreciate any M/R/G Lungs: CTAB, did not appreciate any wheezes, rales, or rhonchi Abdomen: + BS; soft, ND, NT Vascular: 2+ pedal pulses b/l; 2+ radial pulses b/l Extremities: no peripheral edema, no muscle wasting Neuro: strength and sensation intact in upper and lower extremities b/l ***   *** Neuro: 5/5 strength of the proximal and distal flexors and extensors of  the upper and lower extremities bilaterally; sensation intact in upper and lower extremities b/l; cranial nerves II through XII grossly intact; no pronator drift; no evidence suggestive of slurred speech, dysarthria, or facial droop; Normal muscle tone. No tremors.  *** Neuro: In the setting of the patient's current mental status and associated inability to follow instructions, unable to perform full neurologic exam at this time.  As such, assessment of strength, sensation, and cranial nerves is limited at this time. Patient noted to spontaneously move all 4 extremities. No tremors.  ***    Labs on Admission: I have personally reviewed following labs and imaging studies  CBC: Recent Labs  Lab 03/10/23 1315 03/10/23 1319  WBC 9.6  --   NEUTROABS 4.2  --   HGB 11.3* 12.2  HCT 35.7* 36.0  MCV 86.4  --   PLT 280  --    Basic Metabolic Panel: Recent Labs  Lab 03/10/23 1315 03/10/23 1319  NA 137 138  K 5.0 5.0  CL 103 107  CO2 21*  --   GLUCOSE 154* 152*  BUN 52* 48*  CREATININE 4.22* 4.40*  CALCIUM 9.0  --    GFR: Estimated Creatinine Clearance: 18 mL/min (A) (by C-G formula based on SCr of 4.4 mg/dL (H)). Liver Function Tests: Recent  Labs  Lab 03/10/23 1315  AST 55*  ALT 30  ALKPHOS 102  BILITOT 0.5  PROT 7.7  ALBUMIN 3.6   No results for input(s): "LIPASE", "AMYLASE" in the last 168 hours. No results for input(s): "AMMONIA" in the last 168 hours. Coagulation Profile: Recent Labs  Lab 03/10/23 1315  INR 1.0   Cardiac Enzymes: No results for input(s): "CKTOTAL", "CKMB", "CKMBINDEX", "TROPONINI" in the last 168 hours. BNP (last 3 results) Recent Labs    01/15/23 1217 01/28/23 1533  PROBNP <36 9.0   HbA1C: No results for input(s): "HGBA1C" in the last 72 hours. CBG: Recent Labs  Lab 03/10/23 1310  GLUCAP 115*   Lipid Profile: No results for input(s): "CHOL", "HDL", "LDLCALC", "TRIG", "CHOLHDL", "LDLDIRECT" in the last 72 hours. Thyroid Function  Tests: No results for input(s): "TSH", "T4TOTAL", "FREET4", "T3FREE", "THYROIDAB" in the last 72 hours. Anemia Panel: No results for input(s): "VITAMINB12", "FOLATE", "FERRITIN", "TIBC", "IRON", "RETICCTPCT" in the last 72 hours. Urine analysis:    Component Value Date/Time   COLORURINE YELLOW 03/10/2023 1658   APPEARANCEUR HAZY (A) 03/10/2023 1658   LABSPEC 1.012 03/10/2023 1658   PHURINE 5.0 03/10/2023 1658   GLUCOSEU NEGATIVE 03/10/2023 1658   HGBUR NEGATIVE 03/10/2023 1658   BILIRUBINUR NEGATIVE 03/10/2023 1658   BILIRUBINUR neg 04/12/2018 1006   KETONESUR NEGATIVE 03/10/2023 1658   PROTEINUR NEGATIVE 03/10/2023 1658   UROBILINOGEN 0.2 04/12/2018 1006   UROBILINOGEN 0.2 11/18/2014 2325   NITRITE NEGATIVE 03/10/2023 1658   LEUKOCYTESUR MODERATE (A) 03/10/2023 1658    Radiological Exams on Admission: MR BRAIN WO CONTRAST  Result Date: 03/10/2023 CLINICAL DATA:  Acute neurologic deficit EXAM: MRI HEAD WITHOUT CONTRAST TECHNIQUE: Multiplanar, multiecho pulse sequences of the brain and surrounding structures were obtained without intravenous contrast. COMPARISON:  None Available. FINDINGS: Brain: No acute infarct, mass effect or extra-axial collection. No acute or chronic hemorrhage. Normal white matter signal, parenchymal volume and CSF spaces. The midline structures are normal. Vascular: Major flow voids are preserved. Skull and upper cervical spine: Normal calvarium and skull base. Visualized upper cervical spine and soft tissues are normal. Sinuses/Orbits:No paranasal sinus fluid levels or advanced mucosal thickening. No mastoid or middle ear effusion. Normal orbits. IMPRESSION: Normal brain MRI. Electronically Signed   By: Deatra Robinson M.D.   On: 03/10/2023 20:09   CT Angio Chest/Abd/Pel for Dissection W and/or Wo Contrast  Result Date: 03/10/2023 CLINICAL DATA:  Acute aortic syndrome suspected EXAM: CT ANGIOGRAPHY CHEST, ABDOMEN AND PELVIS TECHNIQUE: Non-contrast CT of the chest  was initially obtained. Multidetector CT imaging through the chest, abdomen and pelvis was performed using the standard protocol during bolus administration of intravenous contrast. Multiplanar reconstructed images and MIPs were obtained and reviewed to evaluate the vascular anatomy. RADIATION DOSE REDUCTION: This exam was performed according to the departmental dose-optimization program which includes automated exposure control, adjustment of the mA and/or kV according to patient size and/or use of iterative reconstruction technique. CONTRAST:  75mL OMNIPAQUE IOHEXOL 350 MG/ML SOLN COMPARISON:  Multiple priors, most recent chest CT dated Feb 11, 2023. FINDINGS: CTA CHEST FINDINGS Cardiovascular: Normal heart size. No pericardial effusion. No evidence of central pulmonary embolus, evaluation of the segmental and subsegmental pulmonary arteries is somewhat limited due to bolus timing. No atherosclerotic disease. Mediastinum/Nodes: Esophagus thyroid are unremarkable. Area of right paratracheal low-attenuation seen on series 6, image 32 is unchanged when compared multiple priors and likely pericardial recess fluid. No pathologically enlarged lymph nodes seen in the chest. Lungs/Pleura: Tracheostomy tube in  place. Central airways are patent. No consolidation, pleural effusion or pneumothorax. Musculoskeletal: No chest wall abnormality. No acute or significant osseous findings. Review of the MIP images confirms the above findings. CTA ABDOMEN AND PELVIS FINDINGS VASCULAR Aorta: Limited evaluation of the ascending thoracic aorta due to cardiac motion. Within limitations, normal caliber thoracic aorta. Arch vessels are patent. Normal caliber abdominal aorta. Minimal atherosclerotic disease. No evidence of dissection or intramural hematoma. Celiac: Patent without evidence of aneurysm, dissection, vasculitis or significant stenosis. SMA: Patent without evidence of aneurysm, dissection, vasculitis or significant stenosis.  Renals: Both renal arteries are patent without evidence of aneurysm, dissection, vasculitis, fibromuscular dysplasia or significant stenosis. IMA: Patent without evidence of aneurysm, dissection, vasculitis or significant stenosis. Inflow: Patent without evidence of aneurysm, dissection, vasculitis or significant stenosis. Veins: No obvious venous abnormality within the limitations of this arterial phase study. Review of the MIP images confirms the above findings. NON-VASCULAR Hepatobiliary: Hepatic steatosis. Prior cholecystectomy. No suspicious liver lesion. No biliary ductal dilation. Pancreas: Unremarkable. No pancreatic ductal dilatation or surrounding inflammatory changes. Spleen: Normal in size without focal abnormality. Adrenals/Urinary Tract: Bilateral adrenal glands are unremarkable. No hydronephrosis or nephrolithiasis. Bladder is unremarkable. Stomach/Bowel: Stomach is within normal limits. No evidence of bowel wall thickening, distention, or inflammatory changes. Lymphatic: No enlarged lymph nodes seen in the abdomen or pelvis. Reproductive: No adnexal mass. Other: No abdominal wall hernia or abnormality. No abdominopelvic ascites. Musculoskeletal: Posterior fusion hardware of L4-L5. No aggressive appearing osseous lesions. Review of the MIP images confirms the above findings. IMPRESSION: 1. Normal caliber abdominal aorta with no evidence of acute aortic syndrome. 2. No evidence of central pulmonary embolus. Evaluation of the segmental and subsegmental pulmonary arteries is somewhat limited due to bolus timing. 3. No acute findings in the chest, abdomen or pelvis. 4. Marked hepatic steatosis. Electronically Signed   By: Allegra Lai M.D.   On: 03/10/2023 19:59   CT Cervical Spine Wo Contrast  Result Date: 03/10/2023 CLINICAL DATA:  Trauma, headache and weakness EXAM: CT HEAD WITHOUT CONTRAST CT CERVICAL SPINE WITHOUT CONTRAST TECHNIQUE: Multidetector CT imaging of the head and cervical spine was  performed following the standard protocol without intravenous contrast. Multiplanar CT image reconstructions of the cervical spine were also generated. RADIATION DOSE REDUCTION: This exam was performed according to the departmental dose-optimization program which includes automated exposure control, adjustment of the mA and/or kV according to patient size and/or use of iterative reconstruction technique. COMPARISON:  None Available. FINDINGS: CT HEAD FINDINGS Brain: No evidence of acute infarction, hemorrhage, hydrocephalus, extra-axial collection or mass lesion/mass effect. Vascular: No hyperdense vessel or unexpected calcification. Skull: Normal. Negative for fracture or focal lesion. Sinuses/Orbits: No acute finding. Other: None. CT CERVICAL SPINE FINDINGS Alignment: Normal. Skull base and vertebrae: No acute fracture. No primary bone lesion or focal pathologic process. Soft tissues and spinal canal: No prevertebral fluid or swelling. No visible canal hematoma. Disc levels:  Intact. Upper chest: Negative. Other: Tracheostomy. IMPRESSION: 1. No acute intracranial pathology. 2. No fracture or static subluxation of the cervical spine. Electronically Signed   By: Jearld Lesch M.D.   On: 03/10/2023 15:08   CT Head Wo Contrast  Result Date: 03/10/2023 CLINICAL DATA:  Trauma, headache and weakness EXAM: CT HEAD WITHOUT CONTRAST CT CERVICAL SPINE WITHOUT CONTRAST TECHNIQUE: Multidetector CT imaging of the head and cervical spine was performed following the standard protocol without intravenous contrast. Multiplanar CT image reconstructions of the cervical spine were also generated. RADIATION DOSE REDUCTION: This exam was performed  according to the departmental dose-optimization program which includes automated exposure control, adjustment of the mA and/or kV according to patient size and/or use of iterative reconstruction technique. COMPARISON:  None Available. FINDINGS: CT HEAD FINDINGS Brain: No evidence of acute  infarction, hemorrhage, hydrocephalus, extra-axial collection or mass lesion/mass effect. Vascular: No hyperdense vessel or unexpected calcification. Skull: Normal. Negative for fracture or focal lesion. Sinuses/Orbits: No acute finding. Other: None. CT CERVICAL SPINE FINDINGS Alignment: Normal. Skull base and vertebrae: No acute fracture. No primary bone lesion or focal pathologic process. Soft tissues and spinal canal: No prevertebral fluid or swelling. No visible canal hematoma. Disc levels:  Intact. Upper chest: Negative. Other: Tracheostomy. IMPRESSION: 1. No acute intracranial pathology. 2. No fracture or static subluxation of the cervical spine. Electronically Signed   By: Jearld Lesch M.D.   On: 03/10/2023 15:08      Assessment/Plan   Principal Problem:   AKI (acute kidney injury) (HCC)   ***       ***            ***                ***               ***               ***               ***                ***               ***               ***               ***               ***              ***          ***  DVT prophylaxis: SCD's ***  Code Status: Full code*** Family Communication: none*** Disposition Plan: Per Rounding Team Consults called: none***;  Admission status: ***     I SPENT GREATER THAN 75 *** MINUTES IN CLINICAL CARE TIME/MEDICAL DECISION-MAKING IN COMPLETING THIS ADMISSION.      Chaney Born Jacson Rapaport DO [facility]  From 7PM - 7AM   03/10/2023, 10:40 PM   ***

## 2023-03-10 NOTE — ED Triage Notes (Signed)
Pt c/o numbness, weakness, and pain on entire R side and headache following a syncopal episode x 3 days ago.  Pain score 10/10.  Pt reports taking prescribed pain medication w/o relief.  Pt reports her family says she's been acting "off."  Weakness noted on R side.

## 2023-03-10 NOTE — ED Notes (Signed)
Assisted pt on and off bed pan to urinate. Pt cleaned. Pt resting in bed comfortably.

## 2023-03-10 NOTE — ED Notes (Signed)
Patient tearful and c/o generalized pain to R side.  Medicated per PRN order

## 2023-03-11 ENCOUNTER — Encounter (HOSPITAL_COMMUNITY): Payer: Self-pay | Admitting: Internal Medicine

## 2023-03-11 DIAGNOSIS — R55 Syncope and collapse: Secondary | ICD-10-CM | POA: Diagnosis not present

## 2023-03-11 DIAGNOSIS — N179 Acute kidney failure, unspecified: Secondary | ICD-10-CM | POA: Diagnosis not present

## 2023-03-11 DIAGNOSIS — R569 Unspecified convulsions: Secondary | ICD-10-CM | POA: Diagnosis not present

## 2023-03-11 DIAGNOSIS — R531 Weakness: Secondary | ICD-10-CM

## 2023-03-11 DIAGNOSIS — Z8709 Personal history of other diseases of the respiratory system: Secondary | ICD-10-CM

## 2023-03-11 DIAGNOSIS — D638 Anemia in other chronic diseases classified elsewhere: Secondary | ICD-10-CM | POA: Diagnosis not present

## 2023-03-11 DIAGNOSIS — E119 Type 2 diabetes mellitus without complications: Secondary | ICD-10-CM

## 2023-03-11 DIAGNOSIS — E1165 Type 2 diabetes mellitus with hyperglycemia: Secondary | ICD-10-CM

## 2023-03-11 DIAGNOSIS — J301 Allergic rhinitis due to pollen: Secondary | ICD-10-CM

## 2023-03-11 DIAGNOSIS — T796XXA Traumatic ischemia of muscle, initial encounter: Secondary | ICD-10-CM

## 2023-03-11 DIAGNOSIS — Z9189 Other specified personal risk factors, not elsewhere classified: Secondary | ICD-10-CM

## 2023-03-11 DIAGNOSIS — Z794 Long term (current) use of insulin: Secondary | ICD-10-CM

## 2023-03-11 LAB — CBC WITH DIFFERENTIAL/PLATELET
Abs Immature Granulocytes: 0.02 10*3/uL (ref 0.00–0.07)
Basophils Absolute: 0 10*3/uL (ref 0.0–0.1)
Basophils Relative: 0 %
Eosinophils Absolute: 0.1 10*3/uL (ref 0.0–0.5)
Eosinophils Relative: 1 %
HCT: 35.3 % — ABNORMAL LOW (ref 36.0–46.0)
Hemoglobin: 11.2 g/dL — ABNORMAL LOW (ref 12.0–15.0)
Immature Granulocytes: 0 %
Lymphocytes Relative: 42 %
Lymphs Abs: 3.7 10*3/uL (ref 0.7–4.0)
MCH: 27.7 pg (ref 26.0–34.0)
MCHC: 31.7 g/dL (ref 30.0–36.0)
MCV: 87.2 fL (ref 80.0–100.0)
Monocytes Absolute: 0.7 10*3/uL (ref 0.1–1.0)
Monocytes Relative: 7 %
Neutro Abs: 4.4 10*3/uL (ref 1.7–7.7)
Neutrophils Relative %: 50 %
Platelets: 271 10*3/uL (ref 150–400)
RBC: 4.05 MIL/uL (ref 3.87–5.11)
RDW: 14.6 % (ref 11.5–15.5)
WBC: 8.8 10*3/uL (ref 4.0–10.5)
nRBC: 0 % (ref 0.0–0.2)

## 2023-03-11 LAB — COMPREHENSIVE METABOLIC PANEL
ALT: 28 U/L (ref 0–44)
ALT: 26 U/L (ref 0–44)
AST: 47 U/L — ABNORMAL HIGH (ref 15–41)
AST: 36 U/L (ref 15–41)
Albumin: 3.4 g/dL — ABNORMAL LOW (ref 3.5–5.0)
Albumin: 3.1 g/dL — ABNORMAL LOW (ref 3.5–5.0)
Alkaline Phosphatase: 92 U/L (ref 38–126)
Alkaline Phosphatase: 97 U/L (ref 38–126)
Anion gap: 12 (ref 5–15)
Anion gap: 7 (ref 5–15)
BUN: 40 mg/dL — ABNORMAL HIGH (ref 6–20)
BUN: 28 mg/dL — ABNORMAL HIGH (ref 6–20)
CO2: 20 mmol/L — ABNORMAL LOW (ref 22–32)
CO2: 25 mmol/L (ref 22–32)
Calcium: 8.8 mg/dL — ABNORMAL LOW (ref 8.9–10.3)
Calcium: 9.3 mg/dL (ref 8.9–10.3)
Chloride: 106 mmol/L (ref 98–111)
Chloride: 106 mmol/L (ref 98–111)
Creatinine, Ser: 2.6 mg/dL — ABNORMAL HIGH (ref 0.44–1.00)
Creatinine, Ser: 1.67 mg/dL — ABNORMAL HIGH (ref 0.44–1.00)
GFR, Estimated: 21 mL/min — ABNORMAL LOW (ref >60)
GFR, Estimated: 36 mL/min — ABNORMAL LOW (ref >60)
Glucose, Bld: 154 mg/dL — ABNORMAL HIGH (ref 70–99)
Glucose, Bld: 266 mg/dL — ABNORMAL HIGH (ref 70–99)
Potassium: 4.9 mmol/L (ref 3.5–5.1)
Potassium: 5.5 mmol/L — ABNORMAL HIGH (ref 3.5–5.1)
Sodium: 138 mmol/L (ref 135–145)
Sodium: 138 mmol/L (ref 135–145)
Total Bilirubin: 0.7 mg/dL (ref 0.3–1.2)
Total Bilirubin: 0.4 mg/dL (ref 0.3–1.2)
Total Protein: 7.1 g/dL (ref 6.5–8.1)
Total Protein: 6.9 g/dL (ref 6.5–8.1)

## 2023-03-11 LAB — MAGNESIUM
Magnesium: 1.8 mg/dL (ref 1.7–2.4)
Magnesium: 1.7 mg/dL (ref 1.7–2.4)

## 2023-03-11 LAB — TROPONIN I (HIGH SENSITIVITY): Troponin I (High Sensitivity): 7 ng/L (ref <18)

## 2023-03-11 LAB — HEMOGLOBIN A1C
Hgb A1c MFr Bld: 10.3 % — ABNORMAL HIGH (ref 4.8–5.6)
Mean Plasma Glucose: 248.91 mg/dL

## 2023-03-11 LAB — GLUCOSE, CAPILLARY
Glucose-Capillary: 156 mg/dL — ABNORMAL HIGH (ref 70–99)
Glucose-Capillary: 203 mg/dL — ABNORMAL HIGH (ref 70–99)
Glucose-Capillary: 176 mg/dL — ABNORMAL HIGH (ref 70–99)
Glucose-Capillary: 309 mg/dL — ABNORMAL HIGH (ref 70–99)

## 2023-03-11 LAB — PHOSPHORUS: Phosphorus: 3.3 mg/dL (ref 2.5–4.6)

## 2023-03-11 LAB — CK: Total CK: 3179 U/L — ABNORMAL HIGH (ref 38–234)

## 2023-03-11 MED ORDER — LORATADINE 10 MG PO TABS
10.0000 mg | ORAL_TABLET | Freq: Every day | ORAL | Status: DC
  Administered 2023-03-11 – 2023-03-13 (×3): 10 mg via ORAL
  Filled 2023-03-11 (×3): qty 1

## 2023-03-11 MED ORDER — INSULIN GLARGINE-YFGN 100 UNIT/ML ~~LOC~~ SOLN
30.0000 [IU] | Freq: Every day | SUBCUTANEOUS | Status: DC
  Administered 2023-03-11 – 2023-03-13 (×3): 30 [IU] via SUBCUTANEOUS
  Filled 2023-03-11 (×3): qty 0.3

## 2023-03-11 MED ORDER — BUPROPION HCL ER (SR) 150 MG PO TB12
150.0000 mg | ORAL_TABLET | Freq: Two times a day (BID) | ORAL | Status: DC
  Administered 2023-03-11 – 2023-03-13 (×5): 150 mg via ORAL
  Filled 2023-03-11 (×5): qty 1

## 2023-03-11 MED ORDER — HYDROMORPHONE HCL 1 MG/ML IJ SOLN
0.5000 mg | INTRAMUSCULAR | Status: DC | PRN
  Administered 2023-03-11 – 2023-03-13 (×8): 0.5 mg via INTRAVENOUS
  Filled 2023-03-11 (×8): qty 0.5

## 2023-03-11 MED ORDER — ALBUTEROL SULFATE (2.5 MG/3ML) 0.083% IN NEBU: 2.5000 mg | INHALATION_SOLUTION | RESPIRATORY_TRACT | Status: DC | PRN

## 2023-03-11 MED ORDER — PANTOPRAZOLE SODIUM 40 MG PO TBEC
40.0000 mg | DELAYED_RELEASE_TABLET | Freq: Two times a day (BID) | ORAL | Status: DC
  Administered 2023-03-11 – 2023-03-13 (×5): 40 mg via ORAL
  Filled 2023-03-11 (×5): qty 1

## 2023-03-11 MED ORDER — INSULIN ASPART 100 UNIT/ML IJ SOLN
0.0000 [IU] | Freq: Every day | INTRAMUSCULAR | Status: DC
  Administered 2023-03-11: 4 [IU] via SUBCUTANEOUS

## 2023-03-11 MED ORDER — INSULIN ASPART 100 UNIT/ML IJ SOLN
4.0000 [IU] | Freq: Three times a day (TID) | INTRAMUSCULAR | Status: DC
  Administered 2023-03-11 – 2023-03-13 (×6): 4 [IU] via SUBCUTANEOUS

## 2023-03-11 MED ORDER — HYDROXYZINE HCL 25 MG PO TABS
25.0000 mg | ORAL_TABLET | Freq: Three times a day (TID) | ORAL | Status: DC | PRN
  Administered 2023-03-11: 25 mg via ORAL
  Filled 2023-03-11: qty 1

## 2023-03-11 MED ORDER — MOMETASONE FURO-FORMOTEROL FUM 200-5 MCG/ACT IN AERO
2.0000 | INHALATION_SPRAY | Freq: Two times a day (BID) | RESPIRATORY_TRACT | Status: DC
  Administered 2023-03-12 – 2023-03-13 (×2): 2 via RESPIRATORY_TRACT
  Filled 2023-03-11 (×2): qty 8.8

## 2023-03-11 MED ORDER — DIPHENHYDRAMINE HCL 25 MG PO CAPS
25.0000 mg | ORAL_CAPSULE | Freq: Three times a day (TID) | ORAL | Status: DC | PRN
  Administered 2023-03-11: 25 mg via ORAL
  Filled 2023-03-11 (×2): qty 1

## 2023-03-11 MED ORDER — OXYCODONE-ACETAMINOPHEN 10-325 MG PO TABS: 1.0000 | ORAL_TABLET | Freq: Four times a day (QID) | ORAL | Status: DC | PRN

## 2023-03-11 MED ORDER — PREGABALIN 25 MG PO CAPS
50.0000 mg | ORAL_CAPSULE | Freq: Two times a day (BID) | ORAL | Status: DC
  Administered 2023-03-11 – 2023-03-13 (×5): 50 mg via ORAL
  Filled 2023-03-11 (×5): qty 2

## 2023-03-11 MED ORDER — AMITRIPTYLINE HCL 10 MG PO TABS
10.0000 mg | ORAL_TABLET | Freq: Every day | ORAL | Status: DC
  Administered 2023-03-11 – 2023-03-12 (×2): 10 mg via ORAL
  Filled 2023-03-11 (×2): qty 1

## 2023-03-11 MED ORDER — INSULIN ASPART 100 UNIT/ML IJ SOLN
0.0000 [IU] | Freq: Three times a day (TID) | INTRAMUSCULAR | Status: DC
  Administered 2023-03-11: 3 [IU] via SUBCUTANEOUS
  Administered 2023-03-11: 5 [IU] via SUBCUTANEOUS
  Administered 2023-03-12: 2 [IU] via SUBCUTANEOUS
  Administered 2023-03-12: 5 [IU] via SUBCUTANEOUS
  Administered 2023-03-12: 3 [IU] via SUBCUTANEOUS
  Administered 2023-03-13: 2 [IU] via SUBCUTANEOUS

## 2023-03-11 MED ORDER — LACTATED RINGERS IV SOLN: INTRAVENOUS | Status: DC

## 2023-03-11 MED ORDER — TIZANIDINE HCL 4 MG PO TABS: 2.0000 mg | ORAL_TABLET | Freq: Three times a day (TID) | ORAL | Status: DC | PRN

## 2023-03-11 MED ORDER — OXYCODONE HCL 5 MG PO TABS
5.0000 mg | ORAL_TABLET | Freq: Four times a day (QID) | ORAL | Status: DC | PRN
  Administered 2023-03-11 – 2023-03-13 (×5): 5 mg via ORAL
  Filled 2023-03-11 (×5): qty 1

## 2023-03-11 MED ORDER — OXYCODONE-ACETAMINOPHEN 5-325 MG PO TABS
1.0000 | ORAL_TABLET | Freq: Four times a day (QID) | ORAL | Status: DC | PRN
  Administered 2023-03-11 – 2023-03-13 (×5): 1 via ORAL
  Filled 2023-03-11 (×5): qty 1

## 2023-03-11 NOTE — Consult Note (Signed)
[facility] Renal Consultation Note  Requesting MD: Newton Pigg, DO Indication for Consultation:  AKI   Chief complaint: passed out at home  HPI:  [name] is a 52 y.o. female with a history of chronic trach, type 2 diabetes mellitus, hypertension, and COPD who presented to the hospital after passing out at home.  The event occurred after sitting on the edge of the bed after prolonged supine period per charting.  She states she was talking to her daughter on the phone then she remembers her husband telling her to "wake up, don't go back to sleep again".  It sounds like this event was a couple of days prior to presentation and she states was Saturday.  She is not sure how long she was on the floor.  She states taht she should have come in but didn't want to - ended up coming in because she was "hurting so bad".  She was found to have acute kidney injury.  Her creatinine has improved from 4.4 peak on 6/18 to 2.60 on 6/19.  Nephrology was consulted for assistance with management.  Note that she did receive contrast in the ER.  She had a CT angio chest abdomen pelvis which demonstrated no evidence of acute aortic syndrome and no evidence of a PE.  She is reported as being on Farxiga, Lasix, lisinopril, and spironolactone at home.  She confirmed these meds and states that spironolactone was added a few months ago and that she was having "dizzy spells" prior to adding this medication.  She states that she hasn't passed out in years.  Other meds aren't new.  She has most recently been on LR at 100 ml/hr as of consult.  She has had 800 mL uop thus far today and had 650 mL UOP charted over 6/18.  CTA was without hydro.  Note that she had a CTA PE study in 01/2023 as well.  Note that she has her trach to room air now and she clarifies that she wears 3 liters oxygen at night.     Creatinine, Ser  Date/Time Value Ref Range Status  03/11/2023 12:40 AM 2.60 (H) 0.44 - 1.00  mg/dL Final  40/34/7425 95:63 PM 4.40 (H) 0.44 - 1.00 mg/dL Final  87/56/4332 95:18 PM 4.22 (H) 0.44 - 1.00 mg/dL Final  84/16/6063 01:60 PM 1.50 (H) 0.44 - 1.00 mg/dL Final  10/93/2355 73:22 AM 1.51 (H) 0.57 - 1.00 mg/dL Final  02/54/2706 23:76 AM 1.82 (H) 0.57 - 1.00 mg/dL Final  28/31/5176 16:07 PM 1.71 (H) 0.40 - 1.20 mg/dL Final  37/06/6268 48:54 AM 0.99 0.57 - 1.00 mg/dL Final  62/70/3500 93:81 AM 1.17 (H) 0.44 - 1.00 mg/dL Final  82/99/3716 96:78 AM 1.88 (H) 0.44 - 1.00 mg/dL Final  93/81/0175 10:25 AM 0.96 0.44 - 1.00 mg/dL Final  85/27/7824 23:53 AM 1.24 (H) 0.57 - 1.00 mg/dL Final  61/44/3154 00:86 AM 1.08 (H) 0.57 - 1.00 mg/dL Final  76/19/5093 26:71 AM 0.88 0.57 - 1.00 mg/dL Final  24/58/0998 33:82 PM 1.00 0.44 - 1.00 mg/dL Final  50/53/9767 34:19 PM 1.18 (H) 0.44 - 1.00 mg/dL Final  37/90/2409 73:53 PM 1.08 (H) 0.44 - 1.00 mg/dL Final  29/92/4268 34:19 PM 1.05 (H) 0.57 - 1.00 mg/dL Final  62/22/9798 92:11 AM 2.35 (H) 0.44 - 1.00 mg/dL Final    Comment:    DELTA CHECK NOTED  10/17/2021 06:48 AM 1.30 (H) 0.44 - 1.00 mg/dL Final  94/17/4081 44:81 PM 1.14 (H) 0.44 - 1.00  mg/dL Final  16/06/9603 54:09 AM 1.04 (H) 0.44 - 1.00 mg/dL Final  81/19/1478 29:56 PM 1.11 (H) 0.44 - 1.00 mg/dL Final  21/30/8657 84:69 AM 1.03 (H) 0.44 - 1.00 mg/dL Final  62/95/2841 32:44 AM 1.31 (H) 0.44 - 1.00 mg/dL Final  09/24/7251 66:44 PM 1.21 (H) 0.44 - 1.00 mg/dL Final  03/47/4259 56:38 PM 1.28 (H) 0.44 - 1.00 mg/dL Final  75/64/3329 51:88 AM 1.17 (H) 0.44 - 1.00 mg/dL Final  41/66/0630 16:01 PM 1.22 (H) 0.44 - 1.00 mg/dL Final  09/32/3557 32:20 AM 1.06 (H) 0.44 - 1.00 mg/dL Final  25/42/7062 37:62 AM 1.10 (H) 0.44 - 1.00 mg/dL Final  83/15/1761 60:73 AM 1.14 (H) 0.44 - 1.00 mg/dL Final  71/02/2693 85:46 AM 1.25 (H) 0.44 - 1.00 mg/dL Final  27/11/5007 38:18 AM 1.41 (H) 0.44 - 1.00 mg/dL Final  29/93/7169 67:89 AM 1.68 (H) 0.44 - 1.00 mg/dL Final  38/06/1750 02:58 PM 1.52 (H) 0.44 - 1.00 mg/dL  Final  52/77/8242 35:36 AM 1.35 (H) 0.44 - 1.00 mg/dL Final  14/43/1540 08:67 AM 1.37 (H) 0.44 - 1.00 mg/dL Final  61/95/0932 67:12 PM 1.30 (H) 0.44 - 1.00 mg/dL Final  45/80/9983 38:25 PM 1.26 (H) 0.44 - 1.00 mg/dL Final  05/39/7673 41:93 AM 1.55 (H) 0.44 - 1.00 mg/dL Final  79/10/4095 35:32 AM 1.39 (H) 0.44 - 1.00 mg/dL Final  99/24/2683 41:96 AM 1.39 (H) 0.44 - 1.00 mg/dL Final  22/29/7989 21:19 AM 1.51 (H) 0.44 - 1.00 mg/dL Final  41/74/0814 48:18 PM 1.55 (H) 0.44 - 1.00 mg/dL Final  56/31/4970 26:37 AM 1.86 (H) 0.44 - 1.00 mg/dL Final  85/88/5027 74:12 AM 1.26 (H) 0.44 - 1.00 mg/dL Final  87/86/7672 09:47 AM 1.19 (H) 0.44 - 1.00 mg/dL Final  09/62/8366 29:47 AM 1.26 (H) 0.44 - 1.00 mg/dL Final  65/46/5035 46:56 AM 1.29 (H) 0.44 - 1.00 mg/dL Final  81/27/5170 01:74 AM 1.29 (H) 0.44 - 1.00 mg/dL Final  94/49/6759 16:38 PM 1.07 (H) 0.44 - 1.00 mg/dL Final     PMHx:   Past Medical History:  Diagnosis Date   Abnormal UGI series    Acute pain of right shoulder due to trauma 10/05/2018   Acute respiratory failure with hypoxia and hypercapnia (HCC) 10/23/2018   Allergy    Anemia    Anxiety    Arthritis    Asthma    Chest pain of uncertain etiology 03/22/2019   Chronic back pain    Chronic chest pain    resolved, no problems since 2019 per patient 07/27/19   Coffee ground vomiting 10/14/2021   COPD (chronic obstructive pulmonary disease) (HCC)    inhaler   Depression    DM (diabetes mellitus) (HCC)    INSULIN DEPENDENT - TYPE 1   Dyspnea    GERD (gastroesophageal reflux disease)    Headache(784.0)    History of hiatal hernia    Hyperlipidemia    no med, diet controlled   Hypertension    Hypokalemia    Klebsiella cystitis 03/14/2017   Last Assessment & Plan:   Formatting of this note might be different from the original.  - On Zosyn  - Urine culture: Klebsiella pneumoniae   Neuromuscular disorder (HCC)    Osteoarthritis    Oxygen deficiency    Pneumonia     Respiratory disease 05/2017   uses inhalers, neb tx prn, no oxygen   Rotator cuff tear 07/28/2019   Sleep apnea    does not use CPAP due to trach, uses 2L  supplement oxygen at night   Status post tracheostomy (HCC) 03/14/2017   Last Assessment & Plan:  Formatting of this note might be different from the original. - VERY CRITICAL AIRWAY WITH EXTREME PRECUATION - On tach - ENT following along (please see above for current trach management plan)   Tracheostomy in place [facility]) 02/2017    Past Surgical History:  Procedure Laterality Date   ABDOMINAL HYSTERECTOMY  2009   APPENDECTOMY     BIOPSY  10/15/2021   Procedure: BIOPSY;  Surgeon: Tressia Danas, MD;  Location: [facility] ENDOSCOPY;  Service: Gastroenterology;;   CESAREAN SECTION     x 3   CHOLECYSTECTOMY N/A 03/02/2014   Procedure: LAPAROSCOPIC CHOLECYSTECTOMY;  Surgeon: Clovis Pu. Cornett, MD;  Location: [facility] OR;  Service: General;  Laterality: N/A;   ESOPHAGOGASTRODUODENOSCOPY (EGD) WITH PROPOFOL N/A 10/15/2021   Procedure: ESOPHAGOGASTRODUODENOSCOPY (EGD) WITH PROPOFOL;  Surgeon: Tressia Danas, MD;  Location: [facility] ENDOSCOPY;  Service: Gastroenterology;  Laterality: N/A;   HERNIA REPAIR     PANENDOSCOPY N/A 03/04/2017   Procedure: PANENDOSCOPY WITH POSSIBLE FOREIGN BODY REMOVAL;  Surgeon: Flo Shanks, MD;  Location[facility] OR;  Service: ENT;  Laterality: N/A;   ROTATOR CUFF REPAIR Left    ROTATOR CUFF REPAIR Right 07/27/2019   SHOULDER ARTHROSCOPY WITH SUBACROMIAL DECOMPRESSION, ROTATOR CUFF REPAIR AND BICEP TENDON REPAIR Right 07/28/2019   Procedure: RIGHT SHOULDER ARTHROSCOP, MINI OPEN ROTATOR CUFF TEAR REPAIR,  BICEPS TENODESIS, DISTAL CLAVICLE EXCISION;  Surgeon: Cammy Copa, MD;  Location: [facility] OR;  Service: Orthopedics;  Laterality: Right;   SPINE SURGERY     TOOTH EXTRACTION N/A 02/22/2021   Procedure: DENTAL RESTORATION/EXTRACTIONS;  Surgeon: Ocie Doyne, DMD;  Location: [facility] OR;  Service: Oral Surgery;  Laterality: N/A;    TRACHEOSTOMY  02/2017   TUBAL LIGATION     VESICOVAGINAL FISTULA CLOSURE W/ TAH  2009    Family Hx:  Family History  Problem Relation Age of Onset   Heart attack Mother    Stroke Mother    Diabetes Mother    Hypertension Mother    Arthritis Mother    Stroke Father    Asthma Sister    Hypertension Sister    Asthma Sister    Diabetes Sister    Asthma Brother    Seizures Brother    Asthma Brother    Diabetes Brother    Asthma Daughter    Asthma Son    Asthma Grandson    Colon cancer Neg Hx    Rectal cancer Neg Hx    Stomach cancer Neg Hx    Esophageal cancer Neg Hx    Ovarian cancer Neg Hx    Pancreatic cancer Neg Hx   Her brother has a kidney transplant and her niece (sister's daughter) is ESRD awaiting a renal transplant   Social History:  reports that she quit smoking about 7 years ago. Her smoking use included cigarettes. She has a 12.50 pack-year smoking history. She has never used smokeless tobacco. She reports current alcohol use of about 1.0 standard drink of alcohol per week. She reports that she does not use drugs.  Allergies:  Allergies  Allergen Reactions   Reglan [Metoclopramide] Other (See Comments)    Panic attack    Medications: Prior to Admission medications   Medication Sig Start Date End Date Taking? Authorizing Provider  acetaminophen (TYLENOL) 500 MG tablet Take 1,000 mg by mouth 2 (two) times daily as needed for moderate pain, fever or headache.   Yes [provider]  albuterol (PROVENTIL) (2.5 MG/3ML) 0.083% nebulizer solution Take 3 mLs (2.5 mg total) by nebulization every 6 (six) hours as needed for wheezing or shortness of breath. 12/26/22  Yes Newlin, Odette Horns, MD  albuterol (VENTOLIN HFA) 108 (90 Base) MCG/ACT inhaler INHALE 2 PUFFS BY MOUTH EVERY 6 HOURS AS NEEDED FOR WHEEZING FOR SHORTNESS OF BREATH Patient taking differently: Inhale 2 puffs into the lungs every 6 (six) hours as needed for shortness of breath or wheezing. 02/03/23  Yes  Hoy Register, MD  amitriptyline (ELAVIL) 10 MG tablet TAKE 1 TABLET BY MOUTH AT BEDTIME (DISCONTINUE  TOPAMAX) Patient taking differently: Take 10 mg by mouth at bedtime. 12/08/22  Yes Hoy Register, MD  amLODipine (NORVASC) 5 MG tablet Take 1 tablet (5 mg total) by mouth daily. 01/22/23 04/22/23 Yes Meriam Sprague, MD  buPROPion (WELLBUTRIN SR) 150 MG 12 hr tablet Take 1 tablet (150 mg total) by mouth 2 (two) times daily. 01/15/23  Yes Hoy Register, MD  dapagliflozin propanediol (FARXIGA) 10 MG TABS tablet Take 1 tablet (10 mg total) by mouth daily before breakfast. 01/15/23  Yes Newlin, Enobong, MD  furosemide (LASIX) 40 MG tablet Take 40 mg by mouth daily.   Yes [provider]  hydrOXYzine (ATARAX) 25 MG tablet Take 1 tablet (25 mg total) by mouth 3 (three) times daily as needed. for anxiety Patient taking differently: Take 25 mg by mouth 3 (three) times daily as needed for anxiety. 01/15/23  Yes Newlin, Odette Horns, MD  insulin degludec (TRESIBA FLEXTOUCH) 200 UNIT/ML FlexTouch Pen Inject 170 Units into the skin daily. 03/10/23  Yes Newlin, Odette Horns, MD  insulin lispro (HUMALOG KWIKPEN) 100 UNIT/ML KwikPen Inject 10 Units into the skin 3 (three) times daily. Patient taking differently: Inject 10 Units into the skin with breakfast, with lunch, and with evening meal. 01/15/23  Yes Newlin, Enobong, MD  lisinopril (ZESTRIL) 20 MG tablet Take 1 tablet (20 mg total) by mouth daily. 01/15/23  Yes Newlin, Odette Horns, MD  mometasone-formoterol (DULERA) 200-5 MCG/ACT AERO Inhale 2 puffs into the lungs in the morning and at bedtime. 01/15/23  Yes Newlin, Odette Horns, MD  montelukast (SINGULAIR) 10 MG tablet Take 1 tablet (10 mg total) by mouth at bedtime. 01/15/23  Yes Newlin, Enobong, MD  ondansetron (ZOFRAN) 4 MG tablet TAKE 1 TABLET BY MOUTH EVERY 8 HOURS AS NEEDED FOR NAUSEA FOR VOMITING Patient taking differently: Take 4 mg by mouth every 8 (eight) hours as needed for nausea or vomiting. 02/24/23  Yes  Hoy Register, MD  oxyCODONE-acetaminophen (PERCOCET) 10-325 MG tablet Take 1 tablet by mouth 4 (four) times daily as needed for pain. 11/22/21  Yes [provider]  pantoprazole (PROTONIX) 40 MG tablet Take 1 tablet (40 mg total) by mouth 2 (two) times daily. 01/15/23  Yes Hoy Register, MD  pregabalin (LYRICA) 50 MG capsule Take 1 capsule (50 mg total) by mouth in the morning, at noon, and at bedtime. 10/14/22  Yes Eldred Manges, MD  rosuvastatin (CRESTOR) 20 MG tablet Take 1 tablet by mouth once daily Patient taking differently: Take 20 mg by mouth every evening. 01/21/23  Yes Hoy Register, MD  sertraline (ZOLOFT) 100 MG tablet Take 1 tablet (100 mg total) by mouth at bedtime. Patient taking differently: Take 100 mg by mouth daily. 01/15/23  Yes Hoy Register, MD  spironolactone (ALDACTONE) 25 MG tablet Take 1 tablet (25 mg total) by mouth daily. 01/22/23  Yes Meriam Sprague, MD  tirzepatide [facility]) 7.5 MG/0.5ML Pen Inject 7.5 mg into the  skin once a week. For 4 weeks then increase to 10 mg once a week Patient taking differently: Inject 7.5 mg into the skin every Sunday. 01/19/23  Yes Newlin, Enobong, MD  tiZANidine (ZANAFLEX) 4 MG tablet TAKE 1 TABLET BY MOUTH EVERY 8 HOURS AS NEEDED FOR MUSCLE SPASM Patient taking differently: Take 4 mg by mouth every 8 (eight) hours as needed for muscle spasms. 02/24/23  Yes Hoy Register, MD  Blood Glucose Monitoring Suppl (ONETOUCH VERIO FLEX SYSTEM) w/Device KIT Use to check glucose levels daily; E11.9 01/09/23   Hoy Register, MD  cetirizine (ZYRTEC) 10 MG tablet TAKE 1 TABLET BY MOUTH AT BEDTIME Patient not taking: Reported on 03/11/2023 12/10/21   Charlott Holler, MD  Continuous Blood Gluc Receiver (FREESTYLE LIBRE 2 READER) DEVI USE TO CHECK BLOOD SUGAR THREE TIMES DAILY. 12/06/22   Hoy Register, MD  Continuous Glucose Sensor (FREESTYLE LIBRE 2 SENSOR) MISC USE TO CHECK BLOOD GLUCOSE THREE TIMES DAILY.  CHANGE SENSOR ONCE EVERY 14  DAYS. 02/03/23   Hoy Register, MD  glucose blood (ONETOUCH VERIO) test strip Use to check glucose levels daily; E11.9 01/09/23   Hoy Register, MD  Insulin Pen Needle 31G X 8 MM MISC Use to inject [place] and Humalog. Pt may use 4 pen needles daily. 11/07/22   Hoy Register, MD  levofloxacin (LEVAQUIN) 500 MG tablet Take 1 tablet (500 mg total) by mouth daily. Patient not taking: Reported on 03/11/2023 02/13/23   Glenford Bayley, NP  melatonin 3 MG TABS tablet Take 1 tablet (3 mg total) by mouth at bedtime. Patient not taking: Reported on 03/11/2023 06/20/22   Hoy Register, MD  Misc. Devices MISC Nebulizer device. Diagnosis - Asthma 07/16/20   Hoy Register, MD  Misc. Devices MISC Shower chair 04/03/21   Hoy Register, MD  OneTouch Delica Lancets 33G MISC Use to check glucose levels daily; E11.9 01/09/23   Hoy Register, MD  Vitamin D, Ergocalciferol, (DRISDOL) 1.25 MG (50000 UNIT) CAPS capsule Take 1 capsule (50,000 Units total) by mouth every 7 (seven) days. Patient not taking: Reported on 03/11/2023 07/07/22   Kerrin Champagne, MD  fluticasone-salmeterol (ADVAIR HFA) [phone] MCG/ACT inhaler Inhale 2 puffs into the lungs 2 (two) times daily. 10/15/17 10/15/17  Hoy Register, MD   I have reviewed the patient's current and reported prior to admission medications.  Labs:     Latest Ref Rng & Units 03/11/2023   12:40 AM 03/10/2023    1:19 PM 03/10/2023    1:15 PM  BMP  Glucose 70 - 99 mg/dL 604  540  981   BUN 6 - 20 mg/dL 40  48  52   Creatinine 0.44 - 1.00 mg/dL 1.91  4.78  2.95   Sodium 135 - 145 mmol/L 138  138  137   Potassium 3.5 - 5.1 mmol/L 4.9  5.0  5.0   Chloride 98 - 111 mmol/L 106  107  103   CO2 22 - 32 mmol/L 20   21   Calcium 8.9 - 10.3 mg/dL 8.8   9.0     Urinalysis    Component Value Date/Time   COLORURINE YELLOW 03/10/2023 1658   APPEARANCEUR HAZY (A) 03/10/2023 1658   LABSPEC 1.012 03/10/2023 1658   PHURINE 5.0 03/10/2023 1658   GLUCOSEU NEGATIVE 03/10/2023  1658   HGBUR NEGATIVE 03/10/2023 1658   BILIRUBINUR NEGATIVE 03/10/2023 1658   BILIRUBINUR neg 04/12/2018 1006   KETONESUR NEGATIVE 03/10/2023 1658   PROTEINUR NEGATIVE 03/10/2023 1658  UROBILINOGEN 0.2 04/12/2018 1006   UROBILINOGEN 0.2 11/18/2014 2325   NITRITE NEGATIVE 03/10/2023 1658   LEUKOCYTESUR MODERATE (A) 03/10/2023 1658     ROS:  Pertinent items noted in HPI and remainder of comprehensive ROS otherwise negative.  Physical Exam: Vitals:   03/11/23 1145 03/11/23 1520  BP:    Pulse: 82 87  Resp: 16 18  Temp:    SpO2: 100% 99%     General: adult female in bed in NAD, morbidly obese habitus HEENT: NCAT other than trach in place. Places finger over trach to speak Eyes: EOMI sclera anicteric Neck: supple trachea midline  Heart: S1S2 no rub Lungs: clear but reduced on auscultation; on room air  Abdomen: soft/nontender; obese habitus  Extremities: no pitting edema; no cyanosis or clubbing  Skin: no rash on extremities exposed Neuro: alert and oriented x 3 provides hx and follows commands  Psych normal mood and affect  Assessment/Plan:  # AKI  - Pre-renal as well as rhabdo.  Intake UA neg protein, 0-5 RBC.  Less component of rhabdo as Hb urine was negative - Continue LR as tolerated - Would stop spironolactone (and would not resume) - Would pause lisinopril, farxiga, and lasix - may be able to resume these agents in a stepwise fashion.  She may not need the lasix due to the farxiga acting as a diuretic  # Syncope  - Orthostatic per her history with pre-renal AKI - Improving - medication titration as above   # Chronic hypoxic respiratory failure with trach  - Continue hydration as tolerated - currently on room air and reports she wears 3 liters oxygen nightly   # Rhabdomyolysis  - Continue hydration as tolerated   # CKD stage 3  - Baseline Cr 1.5 recently (May 2025) - previously a bit lower  - She has family history of ESRD  # HTN  - acceptable on  current regimen - medication titration as above  Thank you for the consult.  Please do not hesitate to contact me with any questions.     Estanislado Emms 03/11/2023, 4:48 PM

## 2023-03-11 NOTE — TOC Initial Note (Addendum)
Transition of Care [facility]) - Initial/Assessment Note    Patient Details  Name: [name] MRN: [id number] Date of Birth: [birth year]  [facility] CM/SW Contact:    Leone Haven, RN Phone Number: 03/11/2023, 3:34 PM  Clinical Narrative:                 From home with spouse, she is ambulatory, she has an old trach that she self cares for.  She has a PCP and insurance on file.  She has home oxygen with [employer] 3 liters, she also has PCS aide with [facility] Mon - Friday from 8:30 to 12.  Her spouse is her support system, he  or her kids will transport her home at dc. She gets her medications from [facility].  Per PT /OT eval rec HHPT/HHOT, NCM offered choice, she states she does not have a preference.  NCM made referral to [facility] with [facility].  He states he will need to check with the office and call this NCM back.  Per Kandee Keen they can not take referral.  NCM made referral to [facility] with [facility] , she is able to take referral. Soc will begin 24 to 48 hrs post dc.   Expected Discharge Plan: Home w Home Health Services Barriers to Discharge: Continued Medical Work up   Patient Goals and CMS Choice Patient states their goals for this hospitalization and ongoing recovery are:: return home with family CMS Medicare.gov Compare Post Acute Care list provided to:: Patient Choice offered to / list presented to : Patient      Expected Discharge Plan and Services In-house Referral: NA Discharge Planning Services: CM Consult Post Acute Care Choice: Home Health Living arrangements for the past 2 months: Single Family Home                 DME Arranged: N/A DME Agency: NA       HH Arranged: PT, OT HH Agency [facility] Date HH Agency Contacted: 03/11/23 Time HH Agency Contacted [phone] Representative spoke with at [employer]  Prior Living Arrangements/Services Living arrangements for the past 2 months: Single Family Home Lives with::  Spouse Patient language and need for interpreter reviewed:: Yes Do you feel safe going back to the place where you live?: Yes      Need for Family Participation in Patient Care: Yes (Comment) Care giver support system in place?: Yes (comment) Current home services: DME, Homehealth aide (home oxygen 3 liters with [employer], aide with [facility]) Criminal Activity/Legal Involvement Pertinent to Current Situation/Hospitalization: No - Comment as needed  Activities of Daily Living      Permission Sought/Granted                  Emotional Assessment Appearance:: Appears stated age Attitude/Demeanor/Rapport: Engaged Affect (typically observed): Appropriate Orientation: : Oriented to Self, Oriented to Place, Oriented to  Time, Oriented to Situation Alcohol / Substance Use: Not Applicable Psych Involvement: No (comment)  Admission diagnosis:  History of fall [Z91.81] Upper extremity weakness [R29.898] Weakness of right upper extremity [R29.898] AKI (acute kidney injury) (HCC) [N17.9] Perineal numbness [R20.0] Syncope, unspecified syncope type [R55] Nonintractable headache, unspecified chronicity pattern, unspecified headache type [R51.9] Decreased urine output [R34] Patient Active Problem List   Diagnosis Date Noted   Right sided weakness 03/11/2023   Syncope 03/11/2023   History of COPD 03/11/2023   DM2 (diabetes mellitus, type 2) (HCC) 03/11/2023   Anemia of chronic disease 03/11/2023  Severe persistent asthma without complication 01/15/2023   Moderate episode of recurrent major depressive disorder (HCC) 01/15/2023   Gastritis and gastroduodenitis    Fusion of spine of lumbar region 08/20/2021   Spondylolisthesis, lumbar region 08/20/2021    Class: Chronic   Chronic respiratory failure with hypoxia (HCC) 06/20/2021   Recurrent vomiting 11/28/2020   Dyspnea 08/26/2020   Type 1 diabetes mellitus (HCC) 04/12/2019   Chronic right shoulder pain    Chronic bilateral low  back pain without sciatica 10/05/2018   Dysphonia 10/05/2018   Rotator cuff arthropathy, right 06/15/2018   AKI (acute kidney injury) (HCC) 03/06/2018   Carpal tunnel syndrome 01/18/2018   Normocytic anemia 05/06/2017   Tracheostomy dependent (HCC) 03/26/2017   Morbid obesity with BMI of 40.0-44.9, adult (HCC) 03/13/2017   Subglottic stenosis 03/06/2017   Asthma 03/06/2017   Chronic diastolic heart failure (HCC) 03/06/2017   Tracheal stenosis 03/04/2017   Generalized anxiety disorder    Hemoptysis    Chronic pain syndrome    GERD (gastroesophageal reflux disease) 02/22/2016   Depression 02/22/2016   Migraine 01/30/2015   Bulging lumbar disc 09/05/2013   Essential hypertension 09/05/2013   Allergic rhinitis 08/11/2012   Chronic cough 08/11/2012   OSA (obstructive sleep apnea) 12/04/2011   PCP:  Hoy Register, MD Pharmacy:   [facility]ARE AT [facility], [place] - [address] [place] [address] Phone: [phone] Fax: [phone]  [facility] [address] [place] ([place]), [place] - [address] [place] [address] Phone: [phone] Fax: [phone]  [facility] [address] [place] [address] Phone: [phone] Fax: [phone]     Social Determinants of Health (SDOH) Social History: SDOH Screenings   Food Insecurity: No Food Insecurity (02/18/2023)  Housing: Medium Risk (02/18/2023)  Transportation Needs: No Transportation Needs (02/18/2023)  Utilities: Not At Risk (02/18/2023)  Alcohol Screen: Low Risk  (02/18/2023)  Depression (PHQ2-9): Medium Risk (02/18/2023)  Financial Resource Strain: Medium Risk (06/06/2021)  Physical Activity: Inactive (02/18/2023)  Social Connections: Moderately Isolated (02/18/2023)  Stress: Stress Concern Present (02/18/2023)  Tobacco Use: Medium Risk (03/11/2023)   SDOH Interventions:      Readmission Risk Interventions    08/26/2021    2:49 PM  Readmission Risk Prevention Plan  Transportation Screening Complete  PCP or Specialist Appt within 3-5 Days Complete  HRI or Home Care Consult Complete  Social Work Consult for Recovery Care Planning/Counseling Complete  Palliative Care Screening Not Applicable  Medication Review Oceanographer) Complete

## 2023-03-11 NOTE — Inpatient Diabetes Management (Signed)
Inpatient Diabetes Program Recommendations  AACE/ADA: New Consensus Statement on Inpatient Glycemic Control (2015)  Target Ranges:  Prepandial:   less than 140 mg/dL      Peak postprandial:   less than 180 mg/dL (1-2 hours)      Critically ill patients:  140 - 180 mg/dL   Lab Results  Component Value Date   GLUCAP 203 (H) 03/11/2023   HGBA1C 10.3 (H) 03/11/2023    Review of Glycemic Control  Latest Reference Range & Units 03/10/23 13:10 03/11/23 06:13 03/11/23 11:22  Glucose-Capillary 70 - 99 mg/dL 098 (H) 119 (H) 147 (H)   Diabetes history: DM 2 Outpatient Diabetes medications:  Tresiba 170 units daily Humalog 10 units tid with meals Mounjaro 7.5 mg weekly Current orders for Inpatient glycemic control:  Novolog 0-15 units tid with meals and HS Novolog 4 units tid with meals Semglee 30 units daily  Inpatient Diabetes Program Recommendations:    Briefly spoke to patient regarding DM.  A1C has improved slightly since last visit with MD.  Patient states she does take Tresiba 170 units daily at home.   She is on much less here in the hospital?? Possibly could reduce basal dose if meal coverage was increased some at home.  Agree with current orders.  Told patient her current A1C and that it is improved.  She states she has started the [facility] and appears to have sensor as well.  Will follow.    Thanks,  Beryl Meager, RN, BC-ADM Inpatient Diabetes Coordinator Pager [phone]  (8a-5p)

## 2023-03-11 NOTE — Progress Notes (Signed)
EEG complete - results pending 

## 2023-03-11 NOTE — Progress Notes (Signed)
PROGRESS NOTE  [name]:[id number] DOB: [birth year]   PCP: Hoy Register, MD  Patient is from: Home.  Lives with husband and children.  Uses cane at baseline.  DOA: 03/10/2023 LOS: 1  Chief complaints Chief Complaint  Patient presents with   Loss of Consciousness   Headache   Weakness     Brief Narrative / Interim history: 52 year old F with PMH of chronic tracheostomy, chronic hypoxic RF on 5 to 6 L, COPD, DM-2, HTN, chronic pain, morbid obesity and mood disorder presented to ED with generalized body pain after syncopal episode 3 days prior, and admitted for AKI on CKD-3A, traumatic rhabdomyolysis and syncope.  In ED, vital signs stable. Cr 4.22 (baseline 1.5).  BUN 52.  AST 55.  CBC pending.  Serial troponin negative. Patient was noted to have right-sided weakness. CT head, CT cervical spine, CT chest abdomen and pelvis, and MRI brain without acute finding.  Neurology consulted.  CT cervical spine and EEG ordered.  Nephrology consulted for AKI.  Started on IV fluid and admitted.    Subjective: Seen and examined earlier this morning.  No major events overnight of this morning.  Complains of generalized body pain.  Denies chest pain, dyspnea, GI or UTI symptoms.  Fair urine output overnight, about 650 cc.  Creatinine improved to 2.6.  CK 3000.  Objective: Vitals:   03/11/23 0124 03/11/23 0439 03/11/23 0747 03/11/23 0800  BP: 111/85 125/66 117/68   Pulse: 94 93  84  Resp: 17 17  18   Temp: 99 F (37.2 C) 98.3 F (36.8 C)    TempSrc: Oral Oral    SpO2:  97%  100%  Weight: 126.6 kg 126.6 kg    Height:        Examination:  GENERAL: No apparent distress.  Nontoxic. HEENT: MMM.  Vision and hearing grossly intact.  NECK: Tracheostomy in place. RESP:  No IWOB.  Fair aeration bilaterally. CVS:  RRR. Heart sounds normal.  ABD/GI/GU: BS+. Abd soft, NTND.  MSK/EXT:  Moves extremities. No apparent deformity. No edema.  SKIN: no apparent skin lesion or  wound NEURO: Awake, alert and oriented appropriately.  No apparent focal neuro deficit. PSYCH: Calm. Normal affect.   Procedures:  None  Microbiology summarized: None  Assessment and plan: Principal Problem:   AKI (acute kidney injury) (HCC) Active Problems:   Allergic rhinitis   Right sided weakness   Syncope   History of COPD   DM2 (diabetes mellitus, type 2) (HCC)   Anemia of chronic disease  AKI on CKD-3A: Baseline Cr about 1.5.  Multifactorial including prerenal etiology from dehydration, rhabdomyolysis and concurrent use of diuretics and ACE inhibitor's.  No signs of obstruction on CT chest abdomen and pelvis.  AKI improving Recent Labs    08/16/22 0035 08/18/22 0954 08/19/22 0645 10/09/22 1046 01/28/23 1533 01/29/23 0922 02/06/23 0751 02/11/23 1513 03/10/23 1315 03/10/23 1319 03/11/23 0040  BUN 12 28* 16 17 24* 27* 20  --  52* 48* 40*  CREATININE 0.96 1.88* 1.17* 0.99 1.71* 1.82* 1.51* 1.50* 4.22* 4.40* 2.60*  -Continue IV fluid -Strict intake and output -Avoid nephrotoxic meds -Recheck in the morning  Syncopal episode?  Looks like orthostatic based on patient's description.  She is also at risk for polypharmacy.  Recent TTE without significant finding.  Orthostatic vitals negative in ED.  Serial troponin negative.  CT angio chest/abdomen/pelvis without acute finding.  CT head, MRI brain and EEG without significant finding. -Fall precaution -Minimize sedating medications. -PT/OT eval  Traumatic rhabdomyolysis: Likely due to the above.  CK elevated to 3000 -Continue IV fluid -Recheck CK in the morning  Chronic COPD/chronic hypoxic respiratory failure/chronic tracheostomy: On 5 to 6 L at baseline.  Stable. -Continue home inhalers and oxygen.  Uncontrolled IDDM-2 with hyperglycemia, CKD and neuropathy: A1c 11.2% on 4/25.  On a very high dose of [place] and significant dose of NovoLog and [place]. Recent Labs  Lab 03/10/23 1310 03/11/23 0613 03/11/23 1122   GLUCAP 115* 156* 203*  -Increase SSI to moderate -Add NovoLog 4 units 3 times daily with meals -Continue Semglee 30 units daily -Further adjustment as appropriate -Continue home Lyrica   At risk for polypharmacy: On multiple sedating medication including Percocet, Lyrica, Zanaflex, hydroxyzine, Elavil, melatonin.. -Decreased home oxycodone and Zanaflex. -Continue home Elavil, hydroxyzine and melatonin  Elevated AST: Likely due to rhabdo. -Recheck in the morning  Morbid obesity Body mass index is 52.74 kg/m.           DVT prophylaxis:  SCDs Start: 03/10/23 2229  Code Status: Full code Family Communication: None at bedside Level of care: Progressive Status is: Inpatient Remains inpatient appropriate because: AKI, traumatic rhabdomyolysis and possible syncope   Final disposition: Home with home health in the next 24 to 48 hours Consultants:  Nephrology Neurology  55 minutes with more than 50% spent in reviewing records, counseling patient/family and coordinating care.   Sch Meds:  Scheduled Meds:  amitriptyline  10 mg Oral QHS   buPROPion  150 mg Oral BID   insulin aspart  0-15 Units Subcutaneous TID WC   insulin aspart  0-5 Units Subcutaneous QHS   insulin aspart  4 Units Subcutaneous TID WC   insulin glargine-yfgn  30 Units Subcutaneous Daily   loratadine  10 mg Oral Daily   mometasone-formoterol  2 puff Inhalation BID   montelukast  10 mg Oral QHS   pantoprazole  40 mg Oral BID   pregabalin  50 mg Oral BID   sodium chloride flush  3 mL Intravenous Once   Continuous Infusions:  lactated ringers     sodium chloride Stopped (03/10/23 2053)   PRN Meds:.acetaminophen **OR** acetaminophen, albuterol, HYDROmorphone (DILAUDID) injection, hydrOXYzine, melatonin, naLOXone (NARCAN)  injection, ondansetron (ZOFRAN) IV, oxyCODONE-acetaminophen **AND** oxyCODONE, tiZANidine  Antimicrobials: Anti-infectives (From admission, onward)    None        I have  personally reviewed the following labs and images: CBC: Recent Labs  Lab 03/10/23 1315 03/10/23 1319 03/11/23 0040  WBC 9.6  --  8.8  NEUTROABS 4.2  --  4.4  HGB 11.3* 12.2 11.2*  HCT 35.7* 36.0 35.3*  MCV 86.4  --  87.2  PLT 280  --  271   BMP &GFR Recent Labs  Lab 03/10/23 1315 03/10/23 1319 03/11/23 0040  NA 137 138 138  K 5.0 5.0 4.9  CL 103 107 106  CO2 21*  --  20*  GLUCOSE 154* 152* 154*  BUN 52* 48* 40*  CREATININE 4.22* 4.40* 2.60*  CALCIUM 9.0  --  8.8*  MG  --   --  1.8  PHOS  --   --  3.3   Estimated Creatinine Clearance: 31.3 mL/min (A) (by C-G formula based on SCr of 2.6 mg/dL (H)). Liver & Pancreas: Recent Labs  Lab 03/10/23 1315 03/11/23 0040  AST 55* 47*  ALT 30 28  ALKPHOS 102 92  BILITOT 0.5 0.7  PROT 7.7 7.1  ALBUMIN 3.6 3.4*   No results for input(s): "LIPASE", "AMYLASE" in  the last 168 hours. No results for input(s): "AMMONIA" in the last 168 hours. Diabetic: No results for input(s): "HGBA1C" in the last 72 hours. Recent Labs  Lab 03/10/23 1310 03-16-23 0613 March 16, 2023 1122  GLUCAP 115* 156* 203*   Cardiac Enzymes: Recent Labs  Lab 16-Mar-2023 0040  CKTOTAL 3,179*   Recent Labs    01/15/23 1217 01/28/23 1533  PROBNP <36 9.0   Coagulation Profile: Recent Labs  Lab 03/10/23 1315  INR 1.0   Thyroid Function Tests: No results for input(s): "TSH", "T4TOTAL", "FREET4", "T3FREE", "THYROIDAB" in the last 72 hours. Lipid Profile: No results for input(s): "CHOL", "HDL", "LDLCALC", "TRIG", "CHOLHDL", "LDLDIRECT" in the last 72 hours. Anemia Panel: No results for input(s): "VITAMINB12", "FOLATE", "FERRITIN", "TIBC", "IRON", "RETICCTPCT" in the last 72 hours. Urine analysis:    Component Value Date/Time   COLORURINE YELLOW 03/10/2023 1658   APPEARANCEUR HAZY (A) 03/10/2023 1658   LABSPEC 1.012 03/10/2023 1658   PHURINE 5.0 03/10/2023 1658   GLUCOSEU NEGATIVE 03/10/2023 1658   HGBUR NEGATIVE 03/10/2023 1658   BILIRUBINUR  NEGATIVE 03/10/2023 1658   BILIRUBINUR neg 04/12/2018 1006   KETONESUR NEGATIVE 03/10/2023 1658   PROTEINUR NEGATIVE 03/10/2023 1658   UROBILINOGEN 0.2 04/12/2018 1006   UROBILINOGEN 0.2 11/18/2014 2325   NITRITE NEGATIVE 03/10/2023 1658   LEUKOCYTESUR MODERATE (A) 03/10/2023 1658   Sepsis Labs: Invalid input(s): "PROCALCITONIN", "LACTICIDVEN"  Microbiology: No results found for this or any previous visit (from the past 240 hour(s)).  Radiology Studies: EEG adult  Result Date: 2023/03/16 Charlsie Quest, MD     Mar 16, 2023  8:36 AM Patient Name: [name] MRN: [id number] Epilepsy Attending: Charlsie Quest Referring Physician/Provider: Franne Forts, DO Date: 03/10/2023 Duration: 25.12 mins Patient history: 51yo F with syncope. EEG to evaluate for seizure. Level of alertness: Awake, asleep AEDs during EEG study: None Technical aspects: This EEG study was done with scalp electrodes positioned according to the 10-20 International system of electrode placement. Electrical activity was reviewed with band pass filter of 1-70Hz , sensitivity of 7 uV/mm, display speed of 81mm/sec with a 60Hz  notched filter applied as appropriate. EEG data were recorded continuously and digitally stored.  Video monitoring was available and reviewed as appropriate. Description: The posterior dominant rhythm consists of 9 Hz activity of moderate voltage (25-35 uV) seen predominantly in posterior head regions, symmetric and reactive to eye opening and eye closing. Sleep was characterized by vertex waves, maximal frontocentral region.  EEG showed intermittent generalized 3 to 6 Hz theta-delta slowing. Physiologic photic driving was not seen during photic stimulation.  Hyperventilation was not performed.   ABNORMALITY - Intermittent slow, generalized IMPRESSION: This study is suggestive of mild diffuse encephalopathy, nonspecific etiology. No seizures or epileptiform discharges were seen throughout the  recording. Charlsie Quest   MR Cervical Spine Wo Contrast  Result Date: 03/10/2023 CLINICAL DATA:  Neck trauma, focal neuro deficit or paresthesias. EXAM: MRI CERVICAL SPINE WITHOUT CONTRAST TECHNIQUE: Multiplanar, multisequence MR imaging of the cervical spine was performed. No intravenous contrast was administered. COMPARISON:  None Available. FINDINGS: Alignment: Physiologic. Vertebrae: No fracture, evidence of discitis, or bone lesion. Cord: Normal signal and morphology. Posterior Fossa, vertebral arteries, paraspinal tissues: Negative. Disc levels: C2-3: No significant disc bulge. No neural foraminal stenosis. No central canal stenosis. C3-4: No significant disc bulge. No neural foraminal stenosis. No central canal stenosis. C4-5: No significant disc bulge. No neural foraminal stenosis. No central canal stenosis. C5-6: Mild disc bulge with flattening of the thecal sac. No  significant neural foraminal stenosis. C6-7: Disc bulge with flattening of the thecal sac. No significant neural foraminal stenosis. C7-T1: No significant disc bulge. No neural foraminal stenosis. No central canal stenosis. IMPRESSION: 1. No evidence of fracture or subluxation. 2. Mild degenerative changes at C5-6 and C6-7 without evidence of neural impingement. Electronically Signed   By: Larose Hires D.O.   On: 03/10/2023 22:39   MR BRAIN WO CONTRAST  Result Date: 03/10/2023 CLINICAL DATA:  Acute neurologic deficit EXAM: MRI HEAD WITHOUT CONTRAST TECHNIQUE: Multiplanar, multiecho pulse sequences of the brain and surrounding structures were obtained without intravenous contrast. COMPARISON:  None Available. FINDINGS: Brain: No acute infarct, mass effect or extra-axial collection. No acute or chronic hemorrhage. Normal white matter signal, parenchymal volume and CSF spaces. The midline structures are normal. Vascular: Major flow voids are preserved. Skull and upper cervical spine: Normal calvarium and skull base. Visualized upper  cervical spine and soft tissues are normal. Sinuses/Orbits:No paranasal sinus fluid levels or advanced mucosal thickening. No mastoid or middle ear effusion. Normal orbits. IMPRESSION: Normal brain MRI. Electronically Signed   By: Deatra Robinson M.D.   On: 03/10/2023 20:09   CT Angio Chest/Abd/Pel for Dissection W and/or Wo Contrast  Result Date: 03/10/2023 CLINICAL DATA:  Acute aortic syndrome suspected EXAM: CT ANGIOGRAPHY CHEST, ABDOMEN AND PELVIS TECHNIQUE: Non-contrast CT of the chest was initially obtained. Multidetector CT imaging through the chest, abdomen and pelvis was performed using the standard protocol during bolus administration of intravenous contrast. Multiplanar reconstructed images and MIPs were obtained and reviewed to evaluate the vascular anatomy. RADIATION DOSE REDUCTION: This exam was performed according to the departmental dose-optimization program which includes automated exposure control, adjustment of the mA and/or kV according to patient size and/or use of iterative reconstruction technique. CONTRAST:  75mL OMNIPAQUE IOHEXOL 350 MG/ML SOLN COMPARISON:  Multiple priors, most recent chest CT dated Feb 11, 2023. FINDINGS: CTA CHEST FINDINGS Cardiovascular: Normal heart size. No pericardial effusion. No evidence of central pulmonary embolus, evaluation of the segmental and subsegmental pulmonary arteries is somewhat limited due to bolus timing. No atherosclerotic disease. Mediastinum/Nodes: Esophagus thyroid are unremarkable. Area of right paratracheal low-attenuation seen on series 6, image 32 is unchanged when compared multiple priors and likely pericardial recess fluid. No pathologically enlarged lymph nodes seen in the chest. Lungs/Pleura: Tracheostomy tube in place. Central airways are patent. No consolidation, pleural effusion or pneumothorax. Musculoskeletal: No chest wall abnormality. No acute or significant osseous findings. Review of the MIP images confirms the above findings.  CTA ABDOMEN AND PELVIS FINDINGS VASCULAR Aorta: Limited evaluation of the ascending thoracic aorta due to cardiac motion. Within limitations, normal caliber thoracic aorta. Arch vessels are patent. Normal caliber abdominal aorta. Minimal atherosclerotic disease. No evidence of dissection or intramural hematoma. Celiac: Patent without evidence of aneurysm, dissection, vasculitis or significant stenosis. SMA: Patent without evidence of aneurysm, dissection, vasculitis or significant stenosis. Renals: Both renal arteries are patent without evidence of aneurysm, dissection, vasculitis, fibromuscular dysplasia or significant stenosis. IMA: Patent without evidence of aneurysm, dissection, vasculitis or significant stenosis. Inflow: Patent without evidence of aneurysm, dissection, vasculitis or significant stenosis. Veins: No obvious venous abnormality within the limitations of this arterial phase study. Review of the MIP images confirms the above findings. NON-VASCULAR Hepatobiliary: Hepatic steatosis. Prior cholecystectomy. No suspicious liver lesion. No biliary ductal dilation. Pancreas: Unremarkable. No pancreatic ductal dilatation or surrounding inflammatory changes. Spleen: Normal in size without focal abnormality. Adrenals/Urinary Tract: Bilateral adrenal glands are unremarkable. No hydronephrosis or nephrolithiasis. Bladder is  unremarkable. Stomach/Bowel: Stomach is within normal limits. No evidence of bowel wall thickening, distention, or inflammatory changes. Lymphatic: No enlarged lymph nodes seen in the abdomen or pelvis. Reproductive: No adnexal mass. Other: No abdominal wall hernia or abnormality. No abdominopelvic ascites. Musculoskeletal: Posterior fusion hardware of L4-L5. No aggressive appearing osseous lesions. Review of the MIP images confirms the above findings. IMPRESSION: 1. Normal caliber abdominal aorta with no evidence of acute aortic syndrome. 2. No evidence of central pulmonary embolus.  Evaluation of the segmental and subsegmental pulmonary arteries is somewhat limited due to bolus timing. 3. No acute findings in the chest, abdomen or pelvis. 4. Marked hepatic steatosis. Electronically Signed   By: Allegra Lai M.D.   On: 03/10/2023 19:59   CT Cervical Spine Wo Contrast  Result Date: 03/10/2023 CLINICAL DATA:  Trauma, headache and weakness EXAM: CT HEAD WITHOUT CONTRAST CT CERVICAL SPINE WITHOUT CONTRAST TECHNIQUE: Multidetector CT imaging of the head and cervical spine was performed following the standard protocol without intravenous contrast. Multiplanar CT image reconstructions of the cervical spine were also generated. RADIATION DOSE REDUCTION: This exam was performed according to the departmental dose-optimization program which includes automated exposure control, adjustment of the mA and/or kV according to patient size and/or use of iterative reconstruction technique. COMPARISON:  None Available. FINDINGS: CT HEAD FINDINGS Brain: No evidence of acute infarction, hemorrhage, hydrocephalus, extra-axial collection or mass lesion/mass effect. Vascular: No hyperdense vessel or unexpected calcification. Skull: Normal. Negative for fracture or focal lesion. Sinuses/Orbits: No acute finding. Other: None. CT CERVICAL SPINE FINDINGS Alignment: Normal. Skull base and vertebrae: No acute fracture. No primary bone lesion or focal pathologic process. Soft tissues and spinal canal: No prevertebral fluid or swelling. No visible canal hematoma. Disc levels:  Intact. Upper chest: Negative. Other: Tracheostomy. IMPRESSION: 1. No acute intracranial pathology. 2. No fracture or static subluxation of the cervical spine. Electronically Signed   By: Jearld Lesch M.D.   On: 03/10/2023 15:08   CT Head Wo Contrast  Result Date: 03/10/2023 CLINICAL DATA:  Trauma, headache and weakness EXAM: CT HEAD WITHOUT CONTRAST CT CERVICAL SPINE WITHOUT CONTRAST TECHNIQUE: Multidetector CT imaging of the head and  cervical spine was performed following the standard protocol without intravenous contrast. Multiplanar CT image reconstructions of the cervical spine were also generated. RADIATION DOSE REDUCTION: This exam was performed according to the departmental dose-optimization program which includes automated exposure control, adjustment of the mA and/or kV according to patient size and/or use of iterative reconstruction technique. COMPARISON:  None Available. FINDINGS: CT HEAD FINDINGS Brain: No evidence of acute infarction, hemorrhage, hydrocephalus, extra-axial collection or mass lesion/mass effect. Vascular: No hyperdense vessel or unexpected calcification. Skull: Normal. Negative for fracture or focal lesion. Sinuses/Orbits: No acute finding. Other: None. CT CERVICAL SPINE FINDINGS Alignment: Normal. Skull base and vertebrae: No acute fracture. No primary bone lesion or focal pathologic process. Soft tissues and spinal canal: No prevertebral fluid or swelling. No visible canal hematoma. Disc levels:  Intact. Upper chest: Negative. Other: Tracheostomy. IMPRESSION: 1. No acute intracranial pathology. 2. No fracture or static subluxation of the cervical spine. Electronically Signed   By: Jearld Lesch M.D.   On: 03/10/2023 15:08      Dorlisa Savino T. Layci Stenglein [facility]  If 7PM-7AM, please contact night-coverage [email or web address] 03/11/2023, 11:37 AM

## 2023-03-11 NOTE — Evaluation (Signed)
Physical Therapy Evaluation Patient Details Name: [name] MRN: [id number] DOB: Jun 06, 1971 Today's Date: 03/11/2023  History of Present Illness  Pt is a 52 yo female admitted s/p syncopal episode at home with  numbness/weakness/pain in R side. MRI noted subacute infarct. Pt with acute renal failure. PMH: chronic hypoxic resp failure with trach, COPD, HTN, DMII, on 5-6L O2 at home at night only.  Clinical Impression  Pt presents with admitting diagnosis above. Pt today very limited by pain and subjective history confirmed from OT note. Pt was able to stand at bedside today with Min G no AD however declined further mobility due to pain. Anticipate that pt will be able to progress well once pain is under control. Recommend HHPT upon DC. PT will continue to follow.        Recommendations for follow up therapy are one component of a multi-disciplinary discharge planning process, led by the attending physician.  Recommendations may be updated based on patient status, additional functional criteria and insurance authorization.  Follow Up Recommendations       Assistance Recommended at Discharge Intermittent Supervision/Assistance  Patient can return home with the following  A little help with walking and/or transfers;A little help with bathing/dressing/bathroom;Assistance with cooking/housework;Direct supervision/assist for medications management;Assist for transportation;Help with stairs or ramp for entrance    Equipment Recommendations None recommended by PT  Recommendations for Other Services       Functional Status Assessment Patient has had a recent decline in their functional status and demonstrates the ability to make significant improvements in function in a reasonable and predictable amount of time.     Precautions / Restrictions Precautions Precautions: Fall Restrictions Weight Bearing Restrictions: No      Mobility  Bed Mobility Overal bed mobility: Needs  Assistance Bed Mobility: Supine to Sit, Sit to Supine     Supine to sit: Min assist Sit to supine: Min assist   General bed mobility comments: Pt required HHA for trunk elevation and Min A for RLE management back to bed.    Transfers Overall transfer level: Needs assistance Equipment used: None, 1 person hand held assist Transfers: Sit to/from Stand Sit to Stand: Min guard           General transfer comment: Pt slightly unsteady upon sit to stand however was mostly able to self correct. Pt required 1 person HHA for marching in place.    Ambulation/Gait               General Gait Details: Pt declined  Stairs            Wheelchair Mobility    Modified Rankin (Stroke Patients Only)       Balance Overall balance assessment: Needs assistance Sitting-balance support: Feet supported Sitting balance-[name]: Good     Standing balance support: Bilateral upper extremity supported, Reliant on assistive device for balance Standing balance-[name]: Poor Standing balance comment: Pt reaching for things in standing.                             Pertinent Vitals/Pain Pain Assessment Pain Assessment: 0-10 Pain Score: 8  Pain Location: back, neck and R side of head Pain Descriptors / Indicators: Aching, Headache, Grimacing, Crying Pain Intervention(s): Limited activity within patient's tolerance, Monitored during session, Premedicated before session, Relaxation, Repositioned    Home Living Family/patient expects to be discharged to:: Private residence Living Arrangements: Spouse/significant other;Other (Comment);Children (nurse) Available Help at Discharge:  Family;Personal care attendant;Available 24 hours/day Type of Home: House Home Access: Ramped entrance     Alternate Level Stairs-Number of Steps: flight Home Layout: Two level Home Equipment: Rolling Walker (2 wheels);Shower seat;BSC/3in1;Other (comment) (had cane that was lost last  time she was at hosptial) Additional Comments: Husband cares for pt and nurse comes a few times a week for 3-4 hours to assist with self care. Daughter lives next door.    Prior Function Prior Level of Function : Needs assist;History of Falls (last six months)       Physical Assist : ADLs (physical)   ADLs (physical): Bathing;Dressing;IADLs Mobility Comments: Pt states she walks in house with cane or no assistive device. Stated cane was lost last time at hospital. ADLs Comments: Pt gets assist with bathing and dressing lower body. Pt can feed self, groom and toilet without assist at baseline.     Hand Dominance   Dominant Hand: Right    Extremity/Trunk Assessment   Upper Extremity Assessment Upper Extremity Assessment: RUE deficits/detail RUE Deficits / Details: ROM WFL  Shoulder: 3/5, biceps 4-/5, triceps 4-/5, wrist 3+/5, grip 4/5 RUE Sensation: decreased light touch RUE Coordination: decreased fine motor;decreased gross motor    Lower Extremity Assessment Lower Extremity Assessment: Overall WFL for tasks assessed    Cervical / Trunk Assessment Cervical / Trunk Assessment: Normal  Communication   Communication: Tracheostomy  Cognition Arousal/Alertness: Awake/alert Behavior During Therapy: WFL for tasks assessed/performed Overall Cognitive Status: Within Functional Limits for tasks assessed                                          General Comments General comments (skin integrity, edema, etc.): Pt had trach collar off in bed however was in NAD.    Exercises     Assessment/Plan    PT Assessment Patient needs continued PT services  PT Problem List Decreased strength;Decreased range of motion;Decreased activity tolerance;Decreased balance;Pain;Decreased mobility;Decreased coordination;Decreased knowledge of use of DME;Decreased safety awareness;Decreased knowledge of precautions;Cardiopulmonary status limiting activity       PT Treatment  Interventions DME instruction;Gait training;Stair training;Functional mobility training;Therapeutic activities;Therapeutic exercise;Balance training;Neuromuscular re-education;Patient/family education    PT Goals (Current goals can be found in the Care Plan section)  Acute Rehab PT Goals Patient Stated Goal: to go home PT Goal Formulation: With patient Time For Goal Achievement: 03/25/23 Potential to Achieve Goals: Good    Frequency Min 1X/week     Co-evaluation               AM-PAC PT "6 Clicks" Mobility  Outcome Measure Help needed turning from your back to your side while in a flat bed without using bedrails?: A Little Help needed moving from lying on your back to sitting on the side of a flat bed without using bedrails?: A Little Help needed moving to and from a bed to a chair (including a wheelchair)?: A Little Help needed standing up from a chair using your arms (e.g., wheelchair or bedside chair)?: A Little Help needed to walk in hospital room?: A Lot Help needed climbing 3-5 steps with a railing? : Total 6 Click Score: 15    End of Session   Activity Tolerance: Patient limited by pain Patient left: in bed;with call bell/phone within reach Nurse Communication: Mobility status PT Visit Diagnosis: Other abnormalities of gait and mobility (R26.89)    Time: 1610-9604 PT Time Calculation (min) (  ACUTE ONLY): 16 min   Charges:   PT Evaluation $PT Eval Moderate Complexity: 1 Mod          Nimai Burbach B, PT, DPT Acute Rehab Services [phone]   Gladys Damme 03/11/2023, 12:00 PM

## 2023-03-11 NOTE — Progress Notes (Signed)
Pt. With intermittent runs of VTach. Pt. Symptomatic with SOB and c/o pain in back of her head. On call for [facility] paged to make aware.

## 2023-03-11 NOTE — Procedures (Addendum)
Patient Name: [name]  MRN: [id number]  Epilepsy Attending: Charlsie Quest  Referring Physician/Provider: Franne Forts, DO  Date: 03/10/2023 Duration: 25.12 mins   Patient history: 51yo F with syncope. EEG to evaluate for seizure.   Level of alertness: Awake, asleep  AEDs during EEG study: None  Technical aspects: This EEG study was done with scalp electrodes positioned according to the 10-20 International system of electrode placement. Electrical activity was reviewed with band pass filter of 1-70Hz , sensitivity of 7 uV/mm, display speed of 44mm/sec with a 60Hz  notched filter applied as appropriate. EEG data were recorded continuously and digitally stored.  Video monitoring was available and reviewed as appropriate.  Description: The posterior dominant rhythm consists of 9 Hz activity of moderate voltage (25-35 uV) seen predominantly in posterior head regions, symmetric and reactive to eye opening and eye closing. Sleep was characterized by vertex waves, maximal frontocentral region.  EEG showed intermittent generalized 3 to 6 Hz theta-delta slowing. Physiologic photic driving was not seen during photic stimulation.  Hyperventilation was not performed.     ABNORMALITY - Intermittent slow, generalized  IMPRESSION: This study is suggestive of mild diffuse encephalopathy, nonspecific etiology. No seizures or epileptiform discharges were seen throughout the recording.  Emmarose Klinke Annabelle Harman

## 2023-03-11 NOTE — Evaluation (Signed)
Occupational Therapy Evaluation Patient Details Name: [name] MRN: [id number] DOB: [birth year] Today's Date: 03/11/2023   History of Present Illness Pt is a 52 yo female admitted s/p syncopal episode at home with  numbness/weakness/pain in R side. MRI noted subacute infarct. Pt with acute renal failure. PMH: chronic hypoxic resp failure with trach, COPD, HTN, DMII, on 5-6L O2 at home at night only.   Clinical Impression   Pt admitted with the above diagnosis and has the deficits listed below. Pt would benefit from cont OT to increase independence with basic adls back to her baseline level of functioning and increase strength in R side so she can d/c home with her husband. PTA, pt lives with husband and children live next door. Pt has an aid that comes a few times a week for 3-4 hours to assist with showers and getting dressed/cooking.  Daughter is also very involved. Pt normally can feed self, groom and toilet without assist but is rarely alone at home.  Feel pt will be able to return home with the assist of HHOT to aid in getting back to baseline. If pt improves quickly, pt may not need HHOT. Of concern is pt has a flight of steps up to bedroom and shower and does not have ability to sleep downstairs.  Will continue to follow for above tasks.      Recommendations for follow up therapy are one component of a multi-disciplinary discharge planning process, led by the attending physician.  Recommendations may be updated based on patient status, additional functional criteria and insurance authorization.   Assistance Recommended at Discharge Frequent or constant Supervision/Assistance  Patient can return home with the following A little help with walking and/or transfers;A little help with bathing/dressing/bathroom;Assistance with cooking/housework;Assist for transportation;Help with stairs or ramp for entrance    Functional Status Assessment  Patient has had a recent decline in  their functional status and demonstrates the ability to make significant improvements in function in a reasonable and predictable amount of time.  Equipment Recommendations  None recommended by OT;Other (comment) (may need cane)    Recommendations for Other Services       Precautions / Restrictions Precautions Precautions: Fall Restrictions Weight Bearing Restrictions: No      Mobility Bed Mobility               General bed mobility comments: Pt on EOB on arrival.    Transfers Overall transfer level: Needs assistance Equipment used: 1 person hand held assist Transfers: Sit to/from Stand, Bed to chair/wheelchair/BSC Sit to Stand: Min guard     Step pivot transfers: Min assist     General transfer comment: pt slightly weak on the R making pt feel unsteady when up.  Rec walker for transfers.      Balance Overall balance assessment: Needs assistance Sitting-balance support: Feet supported Sitting balance-[name]: Good     Standing balance support: Bilateral upper extremity supported, Reliant on assistive device for balance Standing balance-[name]: Poor Standing balance comment: Pt reaching for things in standing.                           ADL either performed or assessed with clinical judgement   ADL Overall ADL's : Needs assistance/impaired Eating/Feeding: Set up;Sitting   Grooming: Wash/dry face;Oral care;Minimal assistance;Sitting   Upper Body Bathing: Minimal assistance;Sitting   Lower Body Bathing: Moderate assistance;Sit to/from stand;Cueing for compensatory techniques   Upper Body Dressing : Minimal  assistance;Sitting   Lower Body Dressing: Maximal assistance;Sit to/from stand;Cueing for compensatory techniques   Toilet Transfer: Minimal assistance;Stand-pivot;BSC/3in1   Toileting- Clothing Manipulation and Hygiene: Min guard;Sit to/from stand;Cueing for compensatory techniques       Functional mobility during ADLs: Minimal  assistance;Rolling walker (2 wheels) General ADL Comments: Pt most limited by pain this morning. Pt has a lot of assist at home from nurse, dauther and husband if needed.     Vision Baseline Vision/History: 1 Wears glasses Ability to See in Adequate Light: 0 Adequate Patient Visual Report: No change from baseline Vision Assessment?: Yes Eye Alignment: Within Functional Limits Ocular Range of Motion: Within Functional Limits Alignment/Gaze Preference: Within Defined Limits Tracking/Visual Pursuits: Able to track stimulus in all quads without difficulty Saccades: Within functional limits Convergence: Within functional limits Visual Fields: No apparent deficits Additional Comments: Pt very slow with finger nose finger on both sides but R worse than L     Perception Perception Perception Tested?: No   Praxis Praxis Praxis tested?: Within functional limits    Pertinent Vitals/Pain Pain Assessment Pain Assessment: 0-10 Pain Score: 10-Worst pain ever Pain Location: back, neck and R side of head Pain Descriptors / Indicators: Aching, Headache, Grimacing, Crying Pain Intervention(s): Limited activity within patient's tolerance, Monitored during session, Premedicated before session, Repositioned, Relaxation     Hand Dominance Right   Extremity/Trunk Assessment Upper Extremity Assessment Upper Extremity Assessment: RUE deficits/detail RUE Deficits / Details: ROM WFL  Shoulder: 3/5, biceps 4-/5, triceps 4-/5, wrist 3+/5, grip 4/5 RUE Sensation: decreased light touch RUE Coordination: decreased fine motor;decreased gross motor   Lower Extremity Assessment Lower Extremity Assessment: Defer to PT evaluation   Cervical / Trunk Assessment Cervical / Trunk Assessment: Normal   Communication Communication Communication: Tracheostomy   Cognition Arousal/Alertness: Awake/alert Behavior During Therapy: WFL for tasks assessed/performed Overall Cognitive Status: Within Functional Limits  for tasks assessed                                       General Comments  Pt limited by pain and new R side weakness.    Exercises     Shoulder Instructions      Home Living Family/patient expects to be discharged to:: Private residence Living Arrangements: Spouse/significant other;Other (Comment);Children (nurse) Available Help at Discharge: Family;Personal care attendant;Available 24 hours/day Type of Home: House Home Access: Ramped entrance     Home Layout: Two level Alternate Level Stairs-Number of Steps: flight   Bathroom Shower/Tub: Walk-in shower;Door   Bathroom Toilet: Handicapped height     Home Equipment: Agricultural consultant (2 wheels);Shower seat;BSC/3in1;Other (comment) (had cane that was lost last time she was at hosptial)   Additional Comments: Husband cares for pt and nurse comes a few times a week for 3-4 hours to assist with self care. Daughter lives next door.      Prior Functioning/Environment Prior Level of Function : Needs assist       Physical Assist : ADLs (physical)   ADLs (physical): Bathing;Dressing;IADLs Mobility Comments: Pt states she walks in house with cane or no assistive device. Stated cane was lost last time at hospital. ADLs Comments: Pt gets assist with bathing and dressing lower body. Pt can feed self, groom and toilet without assist at baseline.        OT Problem List: Decreased strength;Impaired balance (sitting and/or standing);Decreased coordination;Impaired sensation;Obesity;Impaired UE functional use;Pain      OT  Treatment/Interventions: Self-care/ADL training;Therapeutic activities;DME and/or AE instruction;Balance training    OT Goals(Current goals can be found in the care plan section) Acute Rehab OT Goals Patient Stated Goal: to be more independent and have less pain OT Goal Formulation: With patient Time For Goal Achievement: 03/25/23 Potential to Achieve Goals: Good ADL Goals Pt Will Perform  Grooming: with supervision;standing Pt Will Transfer to Toilet: with supervision;ambulating;bedside commode Pt Will Perform Toileting - Clothing Manipulation and hygiene: with modified independence;sit to/from stand Pt Will Perform Tub/Shower Transfer: Shower transfer;with supervision;ambulating;shower seat Additional ADL Goal #1: Pt will walk to bathroom with walker if needed with supervision to prepare for toileting. Additional ADL Goal #2: Pt will use RUE during all functional tasks without cuing.  OT Frequency: Min 2X/week    Co-evaluation              AM-PAC OT "6 Clicks" Daily Activity     Outcome Measure Help from another person eating meals?: None Help from another person taking care of personal grooming?: None Help from another person toileting, which includes using toliet, bedpan, or urinal?: A Little Help from another person bathing (including washing, rinsing, drying)?: A Lot Help from another person to put on and taking off regular upper body clothing?: A Little Help from another person to put on and taking off regular lower body clothing?: A Lot 6 Click Score: 18   End of Session Equipment Utilized During Treatment: Rolling walker (2 wheels) Nurse Communication: Mobility status  Activity Tolerance: Patient tolerated treatment well Patient left: in chair;with call bell/phone within reach  OT Visit Diagnosis: Unsteadiness on feet (R26.81);Muscle weakness (generalized) (M62.81);History of falling (Z91.81);Other symptoms and signs involving the nervous system (R29.898);Pain Pain - Right/Left: Right Pain - part of body:  (back, head and neck)                Time: 2376-2831 OT Time Calculation (min): 30 min Charges:  OT General Charges $OT Visit: 1 Visit OT Evaluation $OT Eval Moderate Complexity: 1 Mod OT Treatments $Self Care/Home Management : 8-22 mins  Hope Budds 03/11/2023, 8:43 AM

## 2023-03-12 DIAGNOSIS — N179 Acute kidney failure, unspecified: Secondary | ICD-10-CM | POA: Diagnosis not present

## 2023-03-12 DIAGNOSIS — D638 Anemia in other chronic diseases classified elsewhere: Secondary | ICD-10-CM | POA: Diagnosis not present

## 2023-03-12 DIAGNOSIS — J301 Allergic rhinitis due to pollen: Secondary | ICD-10-CM | POA: Diagnosis not present

## 2023-03-12 DIAGNOSIS — R55 Syncope and collapse: Secondary | ICD-10-CM | POA: Diagnosis not present

## 2023-03-12 LAB — COMPREHENSIVE METABOLIC PANEL
ALT: 25 U/L (ref 0–44)
AST: 33 U/L (ref 15–41)
Albumin: 3 g/dL — ABNORMAL LOW (ref 3.5–5.0)
Alkaline Phosphatase: 91 U/L (ref 38–126)
Anion gap: 8 (ref 5–15)
BUN: 25 mg/dL — ABNORMAL HIGH (ref 6–20)
CO2: 23 mmol/L (ref 22–32)
Calcium: 9.1 mg/dL (ref 8.9–10.3)
Chloride: 107 mmol/L (ref 98–111)
Creatinine, Ser: 1.56 mg/dL — ABNORMAL HIGH (ref 0.44–1.00)
GFR, Estimated: 40 mL/min — ABNORMAL LOW (ref >60)
Glucose, Bld: 195 mg/dL — ABNORMAL HIGH (ref 70–99)
Potassium: 5.2 mmol/L — ABNORMAL HIGH (ref 3.5–5.1)
Sodium: 138 mmol/L (ref 135–145)
Total Bilirubin: 0.3 mg/dL (ref 0.3–1.2)
Total Protein: 6.4 g/dL — ABNORMAL LOW (ref 6.5–8.1)

## 2023-03-12 LAB — CBC
HCT: 31.4 % — ABNORMAL LOW (ref 36.0–46.0)
Hemoglobin: 10.2 g/dL — ABNORMAL LOW (ref 12.0–15.0)
MCH: 28.4 pg (ref 26.0–34.0)
MCHC: 32.5 g/dL (ref 30.0–36.0)
MCV: 87.5 fL (ref 80.0–100.0)
Platelets: 249 10*3/uL (ref 150–400)
RBC: 3.59 MIL/uL — ABNORMAL LOW (ref 3.87–5.11)
RDW: 14.6 % (ref 11.5–15.5)
WBC: 7.7 10*3/uL (ref 4.0–10.5)
nRBC: 0 % (ref 0.0–0.2)

## 2023-03-12 LAB — TROPONIN I (HIGH SENSITIVITY): Troponin I (High Sensitivity): 8 ng/L (ref <18)

## 2023-03-12 LAB — GLUCOSE, CAPILLARY
Glucose-Capillary: 123 mg/dL — ABNORMAL HIGH (ref 70–99)
Glucose-Capillary: 161 mg/dL — ABNORMAL HIGH (ref 70–99)
Glucose-Capillary: 210 mg/dL — ABNORMAL HIGH (ref 70–99)
Glucose-Capillary: 168 mg/dL — ABNORMAL HIGH (ref 70–99)

## 2023-03-12 LAB — BRAIN NATRIURETIC PEPTIDE: B Natriuretic Peptide: 25.2 pg/mL (ref 0.0–100.0)

## 2023-03-12 LAB — PHOSPHORUS: Phosphorus: 3 mg/dL (ref 2.5–4.6)

## 2023-03-12 LAB — CK: Total CK: 1718 U/L — ABNORMAL HIGH (ref 38–234)

## 2023-03-12 LAB — MAGNESIUM: Magnesium: 1.6 mg/dL — ABNORMAL LOW (ref 1.7–2.4)

## 2023-03-12 MED ORDER — FUROSEMIDE 40 MG PO TABS: 40.0000 mg | ORAL_TABLET | Freq: Every day | ORAL | 1 refills | Status: AC | PRN

## 2023-03-12 MED ORDER — SODIUM CHLORIDE 0.9 % IV SOLN: INTRAVENOUS | Status: AC

## 2023-03-12 MED ORDER — LACTATED RINGERS IV SOLN: INTRAVENOUS | Status: DC

## 2023-03-12 MED ORDER — SODIUM ZIRCONIUM CYCLOSILICATE 10 G PO PACK
10.0000 g | PACK | Freq: Once | ORAL | Status: AC
  Administered 2023-03-12: 10 g via ORAL
  Filled 2023-03-12: qty 1

## 2023-03-12 MED ORDER — LISINOPRIL 20 MG PO TABS
20.0000 mg | ORAL_TABLET | Freq: Every day | ORAL | 1 refills | Status: DC
Start: 2023-03-19 — End: 2023-07-06

## 2023-03-12 MED ORDER — ROSUVASTATIN CALCIUM 20 MG PO TABS: 20.0000 mg | ORAL_TABLET | Freq: Every evening | ORAL | Status: DC

## 2023-03-12 NOTE — Progress Notes (Signed)
[facility] Progress Note  Name: [name] MRN: [id number] DOB: 1970-09-30  Chief Complaint:  Syncope and pain   Subjective:  she had 800 mL uop over 6/19 and suspect not all ins/outs are charted.  She has been on LR at 150 ml/hr.  She lives here in [place].  Feels better but still a little dizzy as below. We discussed her medications.  She states she takes lasix 40 mg daily at home - listed as not reconciled.  She states she was told that she might go home tomorrow.   Review of systems:  Denies shortness of breath or chest pain  Denies n/v Feels a little dizzy   ---------------------------- Background on consult:  [name] is a 52 y.o. female with a history of chronic trach, type 2 diabetes mellitus, hypertension, and COPD who presented to the hospital after passing out at home.  The event occurred after sitting on the edge of the bed after prolonged supine period per charting.  She states she was talking to her daughter on the phone then she remembers her husband telling her to "wake up, don't go back to sleep again".  It sounds like this event was a couple of days prior to presentation and she states was Saturday.  She is not sure how long she was on the floor.  She states taht she should have come in but didn't want to - ended up coming in because she was "hurting so bad".  She was found to have acute kidney injury.  Her creatinine has improved from 4.4 peak on 6/18 to 2.60 on 6/19.  Nephrology was consulted for assistance with management.  Note that she did receive contrast in the ER.  She had a CT angio chest abdomen pelvis which demonstrated no evidence of acute aortic syndrome and no evidence of a PE.  She is reported as being on Farxiga, Lasix, lisinopril, and spironolactone at home.  She confirmed these meds and states that spironolactone was added a few months ago and that she was having "dizzy spells" prior to adding this  medication.  She states that she hasn't passed out in years.  Other meds aren't new.  She has most recently been on LR at 100 ml/hr as of consult.  She has had 800 mL uop thus far today and had 650 mL UOP charted over 6/18.  CTA was without hydro.  Note that she had a CTA PE study in 01/2023 as well.  Note that she has her trach to room air now and she clarifies that she wears 3 liters oxygen at night.      Intake/Output Summary (Last 24 hours) at 03/12/2023 1254 Last data filed at 03/12/2023 1000 Gross per 24 hour  Intake 3553.43 ml  Output --  Net 3553.43 ml    Vitals:  Vitals:   03/12/23 0500 03/12/23 0734 03/12/23 0744 03/12/23 1022  BP:   138/73   Pulse:  92 94 98  Resp:  18 18 18   Temp:   98.8 F (37.1 C)   TempSrc:   Oral   SpO2:   99%   Weight: 129 kg     Height:         Physical Exam:   General: adult female in bed in NAD, morbidly obese habitus HEENT: NCAT other than trach in place. Nods/shakes head and places finger over trach to speak Eyes: EOMI sclera anicteric Neck: supple trachea midline  Heart: S1S2 no rub Lungs: clear but  reduced on auscultation; on room air  Abdomen: soft/nontender; obese habitus  Extremities: no pitting edema; no cyanosis or clubbing  Skin: no rash on extremities exposed Neuro: alert and oriented x 3 provides hx and follows commands  Psych normal mood and affect  Medications reviewed   Labs:     Latest Ref Rng & Units 03/12/2023   12:59 AM 03/11/2023    9:35 PM 03/11/2023   12:40 AM  BMP  Glucose 70 - 99 mg/dL 161  096  045   BUN 6 - 20 mg/dL 25  28  40   Creatinine 0.44 - 1.00 mg/dL 4.09  8.11  9.14   Sodium 135 - 145 mmol/L 138  138  138   Potassium 3.5 - 5.1 mmol/L 5.2  5.5  4.9   Chloride 98 - 111 mmol/L 107  106  106   CO2 22 - 32 mmol/L 23  25  20    Calcium 8.9 - 10.3 mg/dL 9.1  9.3  8.8      Assessment/Plan:   # AKI  - Pre-renal as well as rhabdo.  Intake UA neg protein, 0-5 RBC.  Less component of rhabdo as Hb urine  was negative - would not resume spironolactone (likely indefinitely) - Will stop LR   - Would hold lisinopril for now given her hyperkalemia  - Would resume farxiga 10 mg daily on discharge.  Would take lasix 40 mg daily as needed for swelling or shortness of breath (rather than scheduled).  Note that she may not need the lasix as often due to the farxiga acting as a diuretic   # Syncope  - Orthostatic per her history with pre-renal AKI - Improving; transition to NS x 12 hours   - medication titration as above    # Chronic hypoxic respiratory failure with trach  - Continue hydration as tolerated - currently on room air and reports she wears 3 liters oxygen nightly    # Rhabdomyolysis  - hydration as tolerated as above   # CKD stage 3  - Baseline Cr 1.5 recently (May 2025) - previously a bit lower  - She has family history of ESRD   # HTN  - acceptable on current regimen - medication titration as above   Thank you for the consult.  Nephrology will sign off.  Will set up nephrology follow-up for her in 2-3 weeks with myself or a physician extender.   Estanislado Emms, MD 03/12/2023 1:24 PM

## 2023-03-12 NOTE — Progress Notes (Signed)
Physical Therapy Treatment Patient Details Name: Amberley Kohlman [name] MRN: [id number] DOB: 17-Sep-1971 Today's Date: 03/12/2023   History of Present Illness Pt is a 52 yo female admitted s/p syncopal episode at home with  numbness/weakness/pain in R side. MRI noted subacute infarct. Pt with acute renal failure. PMH: chronic hypoxic resp failure with trach, COPD, HTN, DMII, on 5-6L O2 at home at night only.    PT Comments    Pt tolerated treatment well today. Pt was able to progress ambulation today in room with IV pole. No change in DC/DME recs at this time. Pt possibly going home tomorrow and would benefit from stair training in room prior to DC.   Recommendations for follow up therapy are one component of a multi-disciplinary discharge planning process, led by the attending physician.  Recommendations may be updated based on patient status, additional functional criteria and insurance authorization.  Follow Up Recommendations       Assistance Recommended at Discharge Intermittent Supervision/Assistance  Patient can return home with the following A little help with walking and/or transfers;A little help with bathing/dressing/bathroom;Assistance with cooking/housework;Direct supervision/assist for medications management;Assist for transportation;Help with stairs or ramp for entrance   Equipment Recommendations  None recommended by PT    Recommendations for Other Services       Precautions / Restrictions Precautions Precautions: Fall Restrictions Weight Bearing Restrictions: No     Mobility  Bed Mobility Overal bed mobility: Modified Independent Bed Mobility: Supine to Sit     Supine to sit: Modified independent (Device/Increase time)          Transfers Overall transfer level: Needs assistance Equipment used: None Transfers: Sit to/from Stand, Bed to chair/wheelchair/BSC Sit to Stand: Supervision   Step pivot transfers: Min guard       General transfer  comment: no LOB noted today with STS    Ambulation/Gait Ambulation/Gait assistance: Min guard Gait Distance (Feet): 15 Feet Assistive device: IV Pole Gait Pattern/deviations: Decreased stride length, Step-through pattern, Wide base of support Gait velocity: decreased     General Gait Details: 1 seated rest break but pt overall steady   Stairs             Wheelchair Mobility    Modified Rankin (Stroke Patients Only)       Balance Overall balance assessment: Needs assistance Sitting-balance support: Feet supported Sitting balance-[name]: Good     Standing balance support: Single extremity supported, No upper extremity supported, During functional activity Standing balance-[name]: Poor Standing balance comment: Pt reaching for things in standing.                            Cognition Arousal/Alertness: Awake/alert Behavior During Therapy: WFL for tasks assessed/performed Overall Cognitive Status: Within Functional Limits for tasks assessed                                          Exercises      General Comments General comments (skin integrity, edema, etc.): VSS on RA      Pertinent Vitals/Pain Pain Assessment Pain Assessment: 0-10 Pain Score: 6  Pain Location: back, neck and R side of head Pain Descriptors / Indicators: Aching, Headache, Grimacing Pain Intervention(s): Monitored during session, Limited activity within patient's tolerance    Home Living  Prior Function            PT Goals (current goals can now be found in the care plan section) Progress towards PT goals: Progressing toward goals    Frequency    Min 1X/week      PT Plan Current plan remains appropriate    Co-evaluation              AM-PAC PT "6 Clicks" Mobility   Outcome Measure  Help needed turning from your back to your side while in a flat bed without using bedrails?: None Help needed  moving from lying on your back to sitting on the side of a flat bed without using bedrails?: None Help needed moving to and from a bed to a chair (including a wheelchair)?: A Little Help needed standing up from a chair using your arms (e.g., wheelchair or bedside chair)?: A Little Help needed to walk in hospital room?: A Little Help needed climbing 3-5 steps with a railing? : A Lot 6 Click Score: 19    End of Session   Activity Tolerance: Patient tolerated treatment well;Patient limited by fatigue Patient left: in chair;with call bell/phone within reach Nurse Communication: Mobility status PT Visit Diagnosis: Other abnormalities of gait and mobility (R26.89)     Time: 1610-9604 PT Time Calculation (min) (ACUTE ONLY): 21 min  Charges:  $Gait Training: 8-22 mins                     Shela Nevin, PT, DPT Acute Rehab Services [phone]    Gladys Damme 03/12/2023, 12:58 PM

## 2023-03-12 NOTE — Progress Notes (Signed)
PROGRESS NOTE  [name] [name] ZOX:[id number] DOB: [birth year]   PCP: Hoy Register, MD  Patient is from: Home.  Lives with husband and children.  Uses cane at baseline.  DOA: 03/10/2023 LOS: 2  Chief complaints Chief Complaint  Patient presents with   Loss of Consciousness   Headache   Weakness     Brief Narrative / Interim history: 52 year old F with PMH of chronic tracheostomy, chronic hypoxic RF on 5 to 6 L, COPD, DM-2, HTN, chronic pain, morbid obesity and mood disorder presented to ED with generalized body pain after syncopal episode 3 days prior, and admitted for AKI on CKD-3A, traumatic rhabdomyolysis and syncope.  In ED, vital signs stable. Cr 4.22 (baseline 1.5).  BUN 52.  AST 55.  CBC pending.  Serial troponin negative. Patient was noted to have right-sided weakness. CT head, CT cervical spine, CT chest abdomen and pelvis, and MRI brain without acute finding.  Neurology consulted.  MRI cervical spine and EEG ordered.  Nephrology consulted for AKI.  Started on IV fluid and admitted.  Nephrology and neurology consulted.  EEG and MRI cervical spine without significant finding.  AKI resolved.  Rhabdomyolysis improved.  Likely discharge home on 6/21.    Subjective: Seen and examined earlier this morning.  No major events overnight of this morning.  Continues to endorse generalized pain mainly in her neck and head.  Feels better today.  Denies shortness of breath.  Objective: Vitals:   03/12/23 0500 03/12/23 0734 03/12/23 0744 03/12/23 1022  BP:   138/73   Pulse:  92 94 98  Resp:  18 18 18   Temp:   98.8 F (37.1 C)   TempSrc:   Oral   SpO2:   99%   Weight: 129 kg     Height:        Examination:  GENERAL: No apparent distress.  Nontoxic. HEENT: MMM.  Vision and hearing grossly intact.  NECK: Tracheostomy in place. RESP:  No IWOB.  Fair aeration bilaterally. CVS:  RRR. Heart sounds normal.  ABD/GI/GU: BS+. Abd soft, NTND.  MSK/EXT:  Moves extremities.  No apparent deformity.  Trace BLE edema. SKIN: no apparent skin lesion or wound NEURO: Awake, alert and oriented appropriately.  No apparent focal neuro deficit. PSYCH: Calm. Normal affect.   Procedures:  None  Microbiology summarized: None  Assessment and plan: Principal Problem:   AKI (acute kidney injury) (HCC) Active Problems:   Allergic rhinitis   Right sided weakness   Syncope   History of COPD   DM2 (diabetes mellitus, type 2) (HCC)   Anemia of chronic disease  AKI on CKD-3A: Baseline Cr about 1.5.  Multifactorial including prerenal etiology from dehydration, rhabdomyolysis and concurrent use of diuretics and ACE inhibitor's.  No signs of obstruction on CT chest abdomen and pelvis.  AKI resolved. Recent Labs    08/19/22 0645 10/09/22 1046 01/28/23 1533 01/29/23 0922 02/06/23 0751 02/11/23 1513 03/10/23 1315 03/10/23 1319 03/11/23 0040 03/11/23 2135 03/12/23 0059  BUN 16 17 24* 27* 20  --  52* 48* 40* 28* 25*  CREATININE 1.17* 0.99 1.71* 1.82* 1.51* 1.50* 4.22* 4.40* 2.60* 1.67* 1.56*  -Continue IV fluid -Strict intake and output -Avoid nephrotoxic meds -Recheck in the morning  Syncopal episode?  Looks like orthostatic based on patient's description.  She is also at risk for polypharmacy.  Recent TTE without significant finding.  Orthostatic vitals negative in ED.  Serial troponin negative.  CT angio chest/abdomen/pelvis without acute finding.  CT head, MRI  brain and EEG without significant finding. -Fall precaution -Minimize sedating medications.  Had extensive discussion with patient -PT/OT eval-recommended home health.  Traumatic rhabdomyolysis: Likely due to the above.  CK 3000>> 1700 -Continue IV fluid -Recheck CK in the morning  Chronic COPD/chronic hypoxic respiratory failure/chronic tracheostomy: On 5 to 6 L at baseline.  Stable. -Continue home inhalers and oxygen.  Uncontrolled IDDM-2 with hyperglycemia, CKD and neuropathy: A1c 11.2% on 4/25.  On  a very high dose of [place] and significant dose of NovoLog and [place]. Recent Labs  Lab 03/11/23 1122 03/11/23 1615 03/11/23 2147 03/12/23 0635 03/12/23 1137  GLUCAP 203* 176* 309* 123* 161*  -Continue SSI-moderate -Continue NovoLog 4 units 3 times daily with meals -Continue Semglee 30 units daily -Further adjustment as appropriate -Continue home Lyrica  At risk for polypharmacy: On multiple sedating medication including Percocet, Lyrica, Zanaflex, hydroxyzine, Elavil, melatonin. -Extensive discussion with patient at bedside today -Decreased home oxycodone and Zanaflex. -Continue home Elavil, hydroxyzine and melatonin  Hyperkalemia: K5.2. -Lokelma 10 g x 1  Elevated AST: Likely due to rhabdo.  Resolved.  Morbid obesity Body mass index is 53.74 kg/m.           DVT prophylaxis:  SCDs Start: 03/10/23 2229  Code Status: Full code Family Communication: None at bedside Level of care: Telemetry Medical Status is: Inpatient Remains inpatient appropriate because: AKI, traumatic rhabdomyolysis and possible syncope   Final disposition: Home with home health on 6/21. Consultants:  Nephrology Neurology  35 minutes with more than 50% spent in reviewing records, counseling patient/family and coordinating care.   Sch Meds:  Scheduled Meds:  amitriptyline  10 mg Oral QHS   buPROPion  150 mg Oral BID   insulin aspart  0-15 Units Subcutaneous TID WC   insulin aspart  0-5 Units Subcutaneous QHS   insulin aspart  4 Units Subcutaneous TID WC   insulin glargine-yfgn  30 Units Subcutaneous Daily   loratadine  10 mg Oral Daily   mometasone-formoterol  2 puff Inhalation BID   montelukast  10 mg Oral QHS   pantoprazole  40 mg Oral BID   pregabalin  50 mg Oral BID   sodium chloride flush  3 mL Intravenous Once   Continuous Infusions:  sodium chloride Stopped (03/10/23 2053)   PRN Meds:.acetaminophen **OR** acetaminophen, albuterol, diphenhydrAMINE, HYDROmorphone (DILAUDID)  injection, hydrOXYzine, melatonin, naLOXone (NARCAN)  injection, ondansetron (ZOFRAN) IV, oxyCODONE-acetaminophen **AND** oxyCODONE, tiZANidine  Antimicrobials: Anti-infectives (From admission, onward)    None        I have personally reviewed the following labs and images: CBC: Recent Labs  Lab 03/10/23 1315 03/10/23 1319 03/11/23 0040 03/12/23 0059  WBC 9.6  --  8.8 7.7  NEUTROABS 4.2  --  4.4  --   HGB 11.3* 12.2 11.2* 10.2*  HCT 35.7* 36.0 35.3* 31.4*  MCV 86.4  --  87.2 87.5  PLT 280  --  271 249   BMP &GFR Recent Labs  Lab 03/10/23 1315 03/10/23 1319 03/11/23 0040 03/11/23 2135 03/12/23 0059  NA 137 138 138 138 138  K 5.0 5.0 4.9 5.5* 5.2*  CL 103 107 106 106 107  CO2 21*  --  20* 25 23  GLUCOSE 154* 152* 154* 266* 195*  BUN 52* 48* 40* 28* 25*  CREATININE 4.22* 4.40* 2.60* 1.67* 1.56*  CALCIUM 9.0  --  8.8* 9.3 9.1  MG  --   --  1.8 1.7 1.6*  PHOS  --   --  3.3  --  3.0   Estimated Creatinine Clearance: 52.9 mL/min (A) (by C-G formula based on SCr of 1.56 mg/dL (H)). Liver & Pancreas: Recent Labs  Lab 03/10/23 1315 03/11/23 0040 03/11/23 2135 03/12/23 0059  AST 55* 47* 36 33  ALT 30 28 26 25   ALKPHOS 102 92 97 91  BILITOT 0.5 0.7 0.4 0.3  PROT 7.7 7.1 6.9 6.4*  ALBUMIN 3.6 3.4* 3.1* 3.0*   No results for input(s): "LIPASE", "AMYLASE" in the last 168 hours. No results for input(s): "AMMONIA" in the last 168 hours. Diabetic: Recent Labs    03/11/23 0040  HGBA1C 10.3*   Recent Labs  Lab 03/11/23 1122 03/11/23 1615 03/11/23 2147 03/12/23 0635 03/12/23 1137  GLUCAP 203* 176* 309* 123* 161*   Cardiac Enzymes: Recent Labs  Lab 03/11/23 0040 03/12/23 0059  CKTOTAL 3,179* 1,718*   Recent Labs    01/15/23 1217 01/28/23 1533  PROBNP <36 9.0   Coagulation Profile: Recent Labs  Lab 03/10/23 1315  INR 1.0   Thyroid Function Tests: No results for input(s): "TSH", "T4TOTAL", "FREET4", "T3FREE", "THYROIDAB" in the last 72  hours. Lipid Profile: No results for input(s): "CHOL", "HDL", "LDLCALC", "TRIG", "CHOLHDL", "LDLDIRECT" in the last 72 hours. Anemia Panel: No results for input(s): "VITAMINB12", "FOLATE", "FERRITIN", "TIBC", "IRON", "RETICCTPCT" in the last 72 hours. Urine analysis:    Component Value Date/Time   COLORURINE YELLOW 03/10/2023 1658   APPEARANCEUR HAZY (A) 03/10/2023 1658   LABSPEC 1.012 03/10/2023 1658   PHURINE 5.0 03/10/2023 1658   GLUCOSEU NEGATIVE 03/10/2023 1658   HGBUR NEGATIVE 03/10/2023 1658   BILIRUBINUR NEGATIVE 03/10/2023 1658   BILIRUBINUR neg 04/12/2018 1006   KETONESUR NEGATIVE 03/10/2023 1658   PROTEINUR NEGATIVE 03/10/2023 1658   UROBILINOGEN 0.2 04/12/2018 1006   UROBILINOGEN 0.2 11/18/2014 2325   NITRITE NEGATIVE 03/10/2023 1658   LEUKOCYTESUR MODERATE (A) 03/10/2023 1658   Sepsis Labs: Invalid input(s): "PROCALCITONIN", "LACTICIDVEN"  Microbiology: No results found for this or any previous visit (from the past 240 hour(s)).  Radiology Studies: No results found.    Teddie Curd T. Lesleigh Hughson [facility]  If 7PM-7AM, please contact night-coverage [email or web address] 03/12/2023, 1:17 PM

## 2023-03-12 NOTE — Progress Notes (Signed)
Patient had intermittent runs of Kirby Funk, MD notified to make aware and ordered stat labs.

## 2023-03-13 ENCOUNTER — Ambulatory Visit: Payer: 59 | Admitting: Internal Medicine

## 2023-03-13 DIAGNOSIS — E1165 Type 2 diabetes mellitus with hyperglycemia: Secondary | ICD-10-CM | POA: Diagnosis not present

## 2023-03-13 DIAGNOSIS — N179 Acute kidney failure, unspecified: Secondary | ICD-10-CM | POA: Diagnosis not present

## 2023-03-13 DIAGNOSIS — Z8709 Personal history of other diseases of the respiratory system: Secondary | ICD-10-CM | POA: Diagnosis not present

## 2023-03-13 DIAGNOSIS — R531 Weakness: Secondary | ICD-10-CM | POA: Diagnosis not present

## 2023-03-13 LAB — RENAL FUNCTION PANEL
Albumin: 3 g/dL — ABNORMAL LOW (ref 3.5–5.0)
Anion gap: 9 (ref 5–15)
BUN: 14 mg/dL (ref 6–20)
CO2: 21 mmol/L — ABNORMAL LOW (ref 22–32)
Calcium: 8.7 mg/dL — ABNORMAL LOW (ref 8.9–10.3)
Chloride: 106 mmol/L (ref 98–111)
Creatinine, Ser: 1.4 mg/dL — ABNORMAL HIGH (ref 0.44–1.00)
GFR, Estimated: 45 mL/min — ABNORMAL LOW (ref >60)
Glucose, Bld: 237 mg/dL — ABNORMAL HIGH (ref 70–99)
Phosphorus: 3.5 mg/dL (ref 2.5–4.6)
Potassium: 4.8 mmol/L (ref 3.5–5.1)
Sodium: 136 mmol/L (ref 135–145)

## 2023-03-13 LAB — MAGNESIUM: Magnesium: 1.5 mg/dL — ABNORMAL LOW (ref 1.7–2.4)

## 2023-03-13 LAB — CK: Total CK: 1086 U/L — ABNORMAL HIGH (ref 38–234)

## 2023-03-13 LAB — GLUCOSE, CAPILLARY: Glucose-Capillary: 142 mg/dL — ABNORMAL HIGH (ref 70–99)

## 2023-03-13 NOTE — Care Management Important Message (Signed)
Important Message  Patient Details  Name: [name] MRN: [id number] Date of Birth: 10-May-1971   Medicare Important Message Given:  Yes     Renie Ora 03/13/2023, 8:22 AM

## 2023-03-13 NOTE — Plan of Care (Signed)

## 2023-03-13 NOTE — TOC Transition Note (Addendum)
Transition of Care [facility]) - CM/SW Discharge Note   Patient Details  Name: [name] MRN: [id number] Date of Birth: [birth year]  Transition [facility] Care [facility]) CM/SW Contact:  Leone Haven, RN Phone Number: 03/13/2023, 8:53 AM   Clinical Narrative:    Patient is for dc today, she states her spouse will transport her home, she only uses oxygen at night, she does her own trach care and has supplies.  She states she would like a single point cane, she states she gets most of her DME from [employer] so would like to get the cane from [employer].  NCM made referral to [facility] with [employer] for a single point cane.  NCM notified Kelly with [facility] of the dc today as well. Gilmer Mor has been brought up to room.   Final next level of care: Home w Home Health Services Barriers to Discharge: No Barriers Identified   Patient Goals and CMS Choice CMS Medicare.gov Compare Post Acute Care list provided to:: Patient Choice offered to / list presented to : Patient  Discharge Placement                         Discharge Plan and Services Additional resources added to the After Visit Summary for   In-house Referral: NA Discharge Planning Services: CM Consult Post Acute Care Choice: Home Health          DME Arranged: Gilmer Mor DME Agency: [employer] Date DME Agency Contacted: 03/13/23 Time DME Agency Contacted: [phone] Representative spoke with at DME Agency: Marthann Schiller HH Arranged: PT, OT HH Agency: [facility] Date [facility] Agency Contacted: 03/11/23 Time HH Agency Contacted: 1600 Representative spoke with at [facility] Agency: Tresa Endo  Social Determinants of Health (SDOH) Interventions SDOH Screenings   Food Insecurity: No Food Insecurity (02/18/2023)  Housing: Medium Risk (02/18/2023)  Transportation Needs: No Transportation Needs (02/18/2023)  Utilities: Not At Risk (02/18/2023)  Alcohol Screen: Low Risk  (02/18/2023)  Depression (PHQ2-9): Medium Risk (02/18/2023)  Financial Resource Strain:  Medium Risk (06/06/2021)  Physical Activity: Inactive (02/18/2023)  Social Connections: Moderately Isolated (02/18/2023)  Stress: Stress Concern Present (02/18/2023)  Tobacco Use: Medium Risk (03/11/2023)     Readmission Risk Interventions    08/26/2021    2:49 PM  Readmission Risk Prevention Plan  Transportation Screening Complete  PCP or Specialist Appt within 3-5 Days Complete  HRI or Home Care Consult Complete  Social Work Consult for Recovery Care Planning/Counseling Complete  Palliative Care Screening Not Applicable  Medication Review [employer]) Complete

## 2023-03-13 NOTE — Progress Notes (Signed)
Occupational Therapy Treatment Patient Details Name: [name] MRN: [id number] DOB: 09/19/1971 Today's Date: 03/13/2023   History of present illness Pt is a 52 yo female admitted s/p syncopal episode at home with  numbness/weakness/pain in R side. MRI noted subacute infarct. Pt with acute renal failure. PMH: chronic hypoxic resp failure with trach, COPD, HTN, DMII, on 5-6L O2 at home at night only.   OT comments  Focus of session on educating pt in R UE home exercise program with theraputty, squeeze ball and 1 lb weight. Pt verbalized and/or demonstrated understanding. Continue to recommend HHOT.    Recommendations for follow up therapy are one component of a multi-disciplinary discharge planning process, led by the attending physician.  Recommendations may be updated based on patient status, additional functional criteria and insurance authorization.    Assistance Recommended at Discharge Frequent or constant Supervision/Assistance  Patient can return home with the following  A little help with walking and/or transfers;A little help with bathing/dressing/bathroom;Assistance with cooking/housework;Assist for transportation;Help with stairs or ramp for entrance   Equipment Recommendations  None recommended by OT    Recommendations for Other Services      Precautions / Restrictions Precautions Precautions: Fall Restrictions Weight Bearing Restrictions: No       Mobility Bed Mobility Overal bed mobility: Modified Independent                  Transfers                         Balance                                           ADL either performed or assessed with clinical judgement   ADL   Eating/Feeding: Independent   Grooming: Min guard;Standing                                      Extremity/Trunk Assessment              Vision       Perception     Praxis      Cognition  Arousal/Alertness: Awake/alert Behavior During Therapy: WFL for tasks assessed/performed Overall Cognitive Status: Within Functional Limits for tasks assessed                                 General Comments: receptive to education in R UE strengthening home program        Exercises Exercises: Other exercises Other Exercises Other Exercises: Educated in med soft theraputty exercises, use of squeeze ball and shoulder and elbow strengthening with 16 oz water bottle.    Shoulder Instructions       General Comments      Pertinent Vitals/ Pain       Pain Assessment Pain Assessment: No/denies pain  Home Living                                          Prior Functioning/Environment              Frequency  Min 2X/week  Progress Toward Goals  OT Goals(current goals can now be found in the care plan section)  Progress towards OT goals: Progressing toward goals  Acute Rehab OT Goals OT Goal Formulation: With patient Time For Goal Achievement: 03/25/23 Potential to Achieve Goals: Good  Plan Discharge plan remains appropriate    Co-evaluation                 AM-PAC OT "6 Clicks" Daily Activity     Outcome Measure   Help from another person eating meals?: None Help from another person taking care of personal grooming?: None Help from another person toileting, which includes using toliet, bedpan, or urinal?: A Little Help from another person bathing (including washing, rinsing, drying)?: A Lot Help from another person to put on and taking off regular upper body clothing?: A Little Help from another person to put on and taking off regular lower body clothing?: A Lot 6 Click Score: 18    End of Session    OT Visit Diagnosis: Unsteadiness on feet (R26.81);Muscle weakness (generalized) (M62.81);History of falling (Z91.81);Other symptoms and signs involving the nervous system (R29.898);Pain   Activity Tolerance Patient  tolerated treatment well   Patient Left in bed;with call bell/phone within reach   Nurse Communication          Time: 0902-0918 OT Time Calculation (min): 16 min  Charges: OT General Charges $OT Visit: 1 Visit OT Treatments $Therapeutic Exercise: 8-22 mins  Berna Spare, OTR/L Acute Rehabilitation Services Office: [phone]   Evern Bio 03/13/2023, 9:22 AM

## 2023-03-13 NOTE — Discharge Summary (Signed)
Physician Discharge Summary  [name] WUJ:[id number] DOB: 30-Nov-1970 DOA: 03/10/2023  PCP: Brittney Register, MD  Admit date: 03/10/2023 Discharge date: 03/13/2023 Admitted From: Home Disposition: Home Recommendations for Outpatient Follow-up:  Follow up with PCP in in 1 to 2 weeks Nephrology to arrange outpatient follow-up Check BP, CK, CMP and CBC at follow-up Patient is on multiple sedating medication which increases her risk of polypharmacy. She is aware and encouraged to discuss with PCP or her prescribers. Please follow up on the following pending results: None  Home Health: PT/OT Equipment/Devices: Patient has appropriate DME  Discharge Condition: Stable CODE STATUS: Full code  Follow-up Information     [facility].   Specialty: Home Health Services Why: Agency will call you to set up apt times Contact information: [address] [place] [address] [phone]         Brittney Register, MD. Schedule an appointment as soon as possible for a visit in 1 week(s).   Specialty: Family Medicine Contact information: [address] [place] [address] [place] [address] [phone]         [name].   Why: cane Contact information: [address] [place] [address] [phone]                 Hospital course 52 year old F with PMH of chronic tracheostomy, chronic hypoxic RF on 5 to 6 L, COPD, DM-2, HTN, chronic pain, morbid obesity and mood disorder presented to ED with generalized body pain after syncopal episode 3 days prior, and admitted for AKI on CKD-3A, traumatic rhabdomyolysis and syncope.  In ED, vital signs stable. Cr 4.22 (baseline 1.5).  BUN 52.  AST 55.  CBC pending.  Serial troponin negative. Patient was noted to have right-sided weakness. CT head, CT cervical spine, CT chest abdomen and pelvis, and MRI brain without acute finding.  Neurology consulted.  MRI cervical  spine and EEG ordered.  Nephrology consulted for AKI.  Started on IV fluid and admitted.  Nephrology and neurology consulted.   EEG and MRI cervical spine without significant finding.  AKI resolved.  Rhabdomyolysis and symptoms improved.  Cleared for discharge by nephrology.  Discontinue Aldactone on discharge.  Changed Lasix to as needed.  Patient is already on [place].  Advised to hold lisinopril for 1 week.  Outpatient follow-up with PCP in 1 week.  Nephrology will arrange outpatient follow-up.  Patient is at risk for polypharmacy on multiple sedating medications.  Discussed risks in details. Encouraged to discuss those medications with his primary care doctor/prescribers.  See individual problem list below for more.   Problems addressed during this hospitalization Principal Problem:   AKI (acute kidney injury) (HCC) Active Problems:   Allergic rhinitis   Right sided weakness   Syncope   History of COPD   DM2 (diabetes mellitus, type 2) (HCC)   Anemia of chronic disease   AKI on CKD-3A: Baseline Cr about 1.5.  Multifactorial including prerenal etiology from dehydration, rhabdomyolysis and concurrent use of diuretics and ACE inhibitor's.  No signs of obstruction on CT chest abdomen and pelvis.  AKI resolved. Recent Labs    10/09/22 1046 01/28/23 1533 01/29/23 0922 02/06/23 0751 02/11/23 1513 03/10/23 1315 03/10/23 1319 03/11/23 0040 03/11/23 2135 03/12/23 0059 03/13/23 0858  BUN 17 24* 27* 20  --  52* 48* 40* 28* 25* 14  CREATININE 0.99 1.71* 1.82* 1.51* 1.50* 4.22* 4.40* 2.60* 1.67* 1.56* 1.40*  -Recheck renal function  at follow-up. -Nephrology to arrange outpatient follow-up. -Advised to hold lisinopril for 1 week. -Discontinued Aldactone -Change Lasix to as needed  Syncopal episode?  Looks like orthostatic based on patient's description.  She is also at risk for polypharmacy.  Recent TTE without significant finding.  Orthostatic vitals negative in ED.  Serial troponin  negative.  CT angio chest/abdomen/pelvis without acute finding.  CT head, MRI brain and EEG without significant finding. -Encouraged to discuss medications with potential with sedation and fall with your primary care doctor.   Traumatic rhabdomyolysis: Likely due to the above.  CK 3000>> 1700> 1000 -May recheck CK at follow-up -Stop Aldactone and changed Lasix to as needed -Encouraged to maintain good hydration. -See polypharmacy as above  Right-sided weakness: Improved.  MRI brain and MRI cervical spine without acute finding.  Could be due to fall and rhabdo.  Very subtle on discharge   Chronic COPD/chronic hypoxic respiratory failure/chronic tracheostomy: On 5 to 6 L at baseline.  Stable. -Continue home inhalers and oxygen.   Uncontrolled IDDM-2 with hyperglycemia, CKD and neuropathy: A1c 11.2% on 4/25.  On a very high dose of [place] and significant dose of NovoLog and [place].  She did not require that much insulin here but in the setting of renal failure and dietary restriction. -Continue home medications.   At risk for polypharmacy: On multiple sedating medication including Percocet, Lyrica, Zanaflex, hydroxyzine, Elavil, melatonin. -Extensive discussion with patient at bedside.  Encouraged to discuss with her prescribers.   Hyperkalemia: Resolved.   Elevated AST: Likely due to rhabdo.  Resolved.  Physical deconditioning/generalized weakness -HH PT/OT ordered.   Morbid obesity Body mass index is 54 kg/m. -Continue Mounjaro           Time spent 45 minutes  Vital signs Vitals:   03/13/23 0500 03/13/23 0744 03/13/23 0804 03/13/23 0805  BP:    133/84  Pulse:  84 92 90  Temp:    98.7 F (37.1 C)  Resp:  20  18  Height:      Weight: 130 kg     SpO2:   96% 96%  TempSrc:    Oral  BMI (Calculated): 54        Discharge exam  GENERAL: No apparent distress.  Nontoxic. HEENT: MMM.  Vision and hearing grossly intact.  NECK: Tracheostomy in place. RESP:  No  IWOB.  Fair aeration bilaterally. CVS:  RRR. Heart sounds normal.  ABD/GI/GU: BS+. Abd soft, NTND.  MSK/EXT:  Moves extremities. No apparent deformity.  Trace BLE edema. SKIN: no apparent skin lesion or wound NEURO: Awake and alert. Oriented appropriately.  Subtle right-sided weakness PSYCH: Calm. Normal affect.   Discharge Instructions Discharge Instructions     Diet - low sodium heart healthy   Complete by: As directed    Diet Carb Modified   Complete by: As directed    Discharge instructions   Complete by: As directed    It has been a pleasure taking care of you!  You were hospitalized due to acute kidney failure and rhabdomyolysis (breakdown of muscle) likely from dehydration and concurrent use of fluid medications and blood pressure medications.  Your kidney has recovered with intravenous fluid and stopping some of your medications.  Rhabdomyolysis has improved.  We have stopped spironolactone.  You can take Lasix (furosemide) as needed for swelling or shortness of breath. We recommend holding lisinopril and rosuvastatin until you follow-up with your primary care doctor.  Maintain good hydration.  Avoid any over-the-counter pain medication other  than plain Tylenol.  Note that combination of Percocet, Lyrica, Zanaflex, hydroxyzine, amitriptyline and melatonin can increase your risk of drowsiness, sedation, constipation, impaired judgment, respiratory depression that could potentially lead to death and impaired balance that could increase risk of fall. We strongly recommend talking to your doctors who prescribe them. We do not recommend driving, operating machinery or other activity that requires similar mental and physical engagement while taking these medications.     Take care,   Increase activity slowly   Complete by: As directed       Allergies as of 03/13/2023       Reactions   Reglan [metoclopramide] Other (See Comments)   Panic attack        Medication List      STOP taking these medications    levofloxacin 500 MG tablet Commonly known as: LEVAQUIN   spironolactone 25 MG tablet Commonly known as: ALDACTONE       TAKE these medications    acetaminophen 500 MG tablet Commonly known as: TYLENOL Take 1,000 mg by mouth 2 (two) times daily as needed for moderate pain, fever or headache.   albuterol (2.5 MG/3ML) 0.083% nebulizer solution Commonly known as: PROVENTIL Take 3 mLs (2.5 mg total) by nebulization every 6 (six) hours as needed for wheezing or shortness of breath. What changed: Another medication with the same name was changed. Make sure you understand how and when to take each.   albuterol 108 (90 Base) MCG/ACT inhaler Commonly known as: VENTOLIN HFA INHALE 2 PUFFS BY MOUTH EVERY 6 HOURS AS NEEDED FOR WHEEZING FOR SHORTNESS OF BREATH What changed: See the new instructions.   amitriptyline 10 MG tablet Commonly known as: ELAVIL TAKE 1 TABLET BY MOUTH AT BEDTIME (DISCONTINUE  TOPAMAX) What changed: See the new instructions.   amLODipine 5 MG tablet Commonly known as: NORVASC Take 1 tablet (5 mg total) by mouth daily.   buPROPion 150 MG 12 hr tablet Commonly known as: Wellbutrin SR Take 1 tablet (150 mg total) by mouth 2 (two) times daily.   cetirizine 10 MG tablet Commonly known as: ZYRTEC TAKE 1 TABLET BY MOUTH AT BEDTIME   dapagliflozin propanediol 10 MG Tabs tablet Commonly known as: Farxiga Take 1 tablet (10 mg total) by mouth daily before breakfast.   Dulera 200-5 MCG/ACT Aero Generic drug: mometasone-formoterol Inhale 2 puffs into the lungs in the morning and at bedtime.   FreeStyle Libre 2 Reader Hardie Pulley USE TO CHECK BLOOD SUGAR THREE TIMES DAILY.   FreeStyle Libre 2 Sensor Misc USE TO CHECK BLOOD GLUCOSE THREE TIMES DAILY.  CHANGE SENSOR ONCE EVERY 14 DAYS.   furosemide 40 MG tablet Commonly known as: LASIX Take 1 tablet (40 mg total) by mouth daily as needed for fluid or edema. What changed:  when to  take this reasons to take this   hydrOXYzine 25 MG tablet Commonly known as: ATARAX Take 1 tablet (25 mg total) by mouth 3 (three) times daily as needed. for anxiety What changed:  reasons to take this additional instructions   insulin lispro 100 UNIT/ML KwikPen Commonly known as: HumaLOG KwikPen Inject 10 Units into the skin 3 (three) times daily. What changed: when to take this   Insulin Pen Needle 31G X 8 MM Misc Use to inject [place] and Humalog. Pt may use 4 pen needles daily.   lisinopril 20 MG tablet Commonly known as: ZESTRIL Take 1 tablet (20 mg total) by mouth daily. Start taking on: March 19, 2023 What changed: These  instructions start on March 19, 2023. If you are unsure what to do until then, ask your doctor or other care provider.   melatonin 3 MG Tabs tablet Take 1 tablet (3 mg total) by mouth at bedtime.   Misc. Devices Misc Nebulizer device. Diagnosis - Asthma   Misc. Devices Misc Shower chair   montelukast 10 MG tablet Commonly known as: SINGULAIR Take 1 tablet (10 mg total) by mouth at bedtime.   Mounjaro 7.5 MG/0.5ML Pen Generic drug: tirzepatide Inject 7.5 mg into the skin once a week. For 4 weeks then increase to 10 mg once a week What changed:  when to take this additional instructions   ondansetron 4 MG tablet Commonly known as: ZOFRAN TAKE 1 TABLET BY MOUTH EVERY 8 HOURS AS NEEDED FOR NAUSEA FOR VOMITING What changed: See the new instructions.   OneTouch Delica Lancets 33G Misc Use to check glucose levels daily; E11.9   OneTouch Verio Flex System w/Device Kit Use to check glucose levels daily; E11.9   OneTouch Verio test strip Generic drug: glucose blood Use to check glucose levels daily; E11.9   oxyCODONE-acetaminophen 10-325 MG tablet Commonly known as: PERCOCET Take 1 tablet by mouth 4 (four) times daily as needed for pain.   pantoprazole 40 MG tablet Commonly known as: PROTONIX Take 1 tablet (40 mg total) by mouth 2 (two)  times daily.   pregabalin 50 MG capsule Commonly known as: LYRICA Take 1 capsule (50 mg total) by mouth in the morning, at noon, and at bedtime.   rosuvastatin 20 MG tablet Commonly known as: CRESTOR Take 1 tablet (20 mg total) by mouth every evening. Start taking on: March 19, 2023 What changed:  when to take this These instructions start on March 19, 2023. If you are unsure what to do until then, ask your doctor or other care provider.   sertraline 100 MG tablet Commonly known as: ZOLOFT Take 1 tablet (100 mg total) by mouth at bedtime. What changed: when to take this   tiZANidine 4 MG tablet Commonly known as: ZANAFLEX TAKE 1 TABLET BY MOUTH EVERY 8 HOURS AS NEEDED FOR MUSCLE SPASM What changed: See the new instructions.   Evaristo Bury FlexTouch 200 UNIT/ML FlexTouch Pen Generic drug: insulin degludec Inject 170 Units into the skin daily.   Vitamin D (Ergocalciferol) 1.25 MG (50000 UNIT) Caps capsule Commonly known as: DRISDOL Take 1 capsule (50,000 Units total) by mouth every 7 (seven) days.               Durable Medical Equipment  (From admission, onward)           Start     Ordered   03/12/23 0732  For home use only DME Cane  Once        03/12/23 2831            Consultations: Nephrology Neurology  Procedures/Studies:   EEG adult  Result Date: 03/11/2023 Charlsie Quest, MD     03/11/2023  8:36 AM Patient Name: [name] MRN: [id number] Epilepsy Attending: Charlsie Quest Referring Physician/Provider: Franne Forts, DO Date: 03/10/2023 Duration: 25.12 mins Patient history: 51yo F with syncope. EEG to evaluate for seizure. Level of alertness: Awake, asleep AEDs during EEG study: None Technical aspects: This EEG study was done with scalp electrodes positioned according to the 10-20 International system of electrode placement. Electrical activity was reviewed with band pass filter of 1-70Hz , sensitivity of 7 uV/mm, display speed of  31mm/sec with a 60Hz   notched filter applied as appropriate. EEG data were recorded continuously and digitally stored.  Video monitoring was available and reviewed as appropriate. Description: The posterior dominant rhythm consists of 9 Hz activity of moderate voltage (25-35 uV) seen predominantly in posterior head regions, symmetric and reactive to eye opening and eye closing. Sleep was characterized by vertex waves, maximal frontocentral region.  EEG showed intermittent generalized 3 to 6 Hz theta-delta slowing. Physiologic photic driving was not seen during photic stimulation.  Hyperventilation was not performed.   ABNORMALITY - Intermittent slow, generalized IMPRESSION: This study is suggestive of mild diffuse encephalopathy, nonspecific etiology. No seizures or epileptiform discharges were seen throughout the recording. Charlsie Quest   MR Cervical Spine Wo Contrast  Result Date: 03/10/2023 CLINICAL DATA:  Neck trauma, focal neuro deficit or paresthesias. EXAM: MRI CERVICAL SPINE WITHOUT CONTRAST TECHNIQUE: Multiplanar, multisequence MR imaging of the cervical spine was performed. No intravenous contrast was administered. COMPARISON:  None Available. FINDINGS: Alignment: Physiologic. Vertebrae: No fracture, evidence of discitis, or bone lesion. Cord: Normal signal and morphology. Posterior Fossa, vertebral arteries, paraspinal tissues: Negative. Disc levels: C2-3: No significant disc bulge. No neural foraminal stenosis. No central canal stenosis. C3-4: No significant disc bulge. No neural foraminal stenosis. No central canal stenosis. C4-5: No significant disc bulge. No neural foraminal stenosis. No central canal stenosis. C5-6: Mild disc bulge with flattening of the thecal sac. No significant neural foraminal stenosis. C6-7: Disc bulge with flattening of the thecal sac. No significant neural foraminal stenosis. C7-T1: No significant disc bulge. No neural foraminal stenosis. No central canal stenosis.  IMPRESSION: 1. No evidence of fracture or subluxation. 2. Mild degenerative changes at C5-6 and C6-7 without evidence of neural impingement. Electronically Signed   By: Larose Hires D.O.   On: 03/10/2023 22:39   MR BRAIN WO CONTRAST  Result Date: 03/10/2023 CLINICAL DATA:  Acute neurologic deficit EXAM: MRI HEAD WITHOUT CONTRAST TECHNIQUE: Multiplanar, multiecho pulse sequences of the brain and surrounding structures were obtained without intravenous contrast. COMPARISON:  None Available. FINDINGS: Brain: No acute infarct, mass effect or extra-axial collection. No acute or chronic hemorrhage. Normal white matter signal, parenchymal volume and CSF spaces. The midline structures are normal. Vascular: Major flow voids are preserved. Skull and upper cervical spine: Normal calvarium and skull base. Visualized upper cervical spine and soft tissues are normal. Sinuses/Orbits:No paranasal sinus fluid levels or advanced mucosal thickening. No mastoid or middle ear effusion. Normal orbits. IMPRESSION: Normal brain MRI. Electronically Signed   By: Deatra Robinson M.D.   On: 03/10/2023 20:09   CT Angio Chest/Abd/Pel for Dissection W and/or Wo Contrast  Result Date: 03/10/2023 CLINICAL DATA:  Acute aortic syndrome suspected EXAM: CT ANGIOGRAPHY CHEST, ABDOMEN AND PELVIS TECHNIQUE: Non-contrast CT of the chest was initially obtained. Multidetector CT imaging through the chest, abdomen and pelvis was performed using the standard protocol during bolus administration of intravenous contrast. Multiplanar reconstructed images and MIPs were obtained and reviewed to evaluate the vascular anatomy. RADIATION DOSE REDUCTION: This exam was performed according to the departmental dose-optimization program which includes automated exposure control, adjustment of the mA and/or kV according to patient size and/or use of iterative reconstruction technique. CONTRAST:  75mL OMNIPAQUE IOHEXOL 350 MG/ML SOLN COMPARISON:  Multiple priors, most  recent chest CT dated Feb 11, 2023. FINDINGS: CTA CHEST FINDINGS Cardiovascular: Normal heart size. No pericardial effusion. No evidence of central pulmonary embolus, evaluation of the segmental and subsegmental pulmonary arteries is somewhat limited due to bolus timing. No atherosclerotic disease.  Mediastinum/Nodes: Esophagus thyroid are unremarkable. Area of right paratracheal low-attenuation seen on series 6, image 32 is unchanged when compared multiple priors and likely pericardial recess fluid. No pathologically enlarged lymph nodes seen in the chest. Lungs/Pleura: Tracheostomy tube in place. Central airways are patent. No consolidation, pleural effusion or pneumothorax. Musculoskeletal: No chest wall abnormality. No acute or significant osseous findings. Review of the MIP images confirms the above findings. CTA ABDOMEN AND PELVIS FINDINGS VASCULAR Aorta: Limited evaluation of the ascending thoracic aorta due to cardiac motion. Within limitations, normal caliber thoracic aorta. Arch vessels are patent. Normal caliber abdominal aorta. Minimal atherosclerotic disease. No evidence of dissection or intramural hematoma. Celiac: Patent without evidence of aneurysm, dissection, vasculitis or significant stenosis. SMA: Patent without evidence of aneurysm, dissection, vasculitis or significant stenosis. Renals: Both renal arteries are patent without evidence of aneurysm, dissection, vasculitis, fibromuscular dysplasia or significant stenosis. IMA: Patent without evidence of aneurysm, dissection, vasculitis or significant stenosis. Inflow: Patent without evidence of aneurysm, dissection, vasculitis or significant stenosis. Veins: No obvious venous abnormality within the limitations of this arterial phase study. Review of the MIP images confirms the above findings. NON-VASCULAR Hepatobiliary: Hepatic steatosis. Prior cholecystectomy. No suspicious liver lesion. No biliary ductal dilation. Pancreas: Unremarkable. No  pancreatic ductal dilatation or surrounding inflammatory changes. Spleen: Normal in size without focal abnormality. Adrenals/Urinary Tract: Bilateral adrenal glands are unremarkable. No hydronephrosis or nephrolithiasis. Bladder is unremarkable. Stomach/Bowel: Stomach is within normal limits. No evidence of bowel wall thickening, distention, or inflammatory changes. Lymphatic: No enlarged lymph nodes seen in the abdomen or pelvis. Reproductive: No adnexal mass. Other: No abdominal wall hernia or abnormality. No abdominopelvic ascites. Musculoskeletal: Posterior fusion hardware of L4-L5. No aggressive appearing osseous lesions. Review of the MIP images confirms the above findings. IMPRESSION: 1. Normal caliber abdominal aorta with no evidence of acute aortic syndrome. 2. No evidence of central pulmonary embolus. Evaluation of the segmental and subsegmental pulmonary arteries is somewhat limited due to bolus timing. 3. No acute findings in the chest, abdomen or pelvis. 4. Marked hepatic steatosis. Electronically Signed   By: Allegra Lai M.D.   On: 03/10/2023 19:59   CT Cervical Spine Wo Contrast  Result Date: 03/10/2023 CLINICAL DATA:  Trauma, headache and weakness EXAM: CT HEAD WITHOUT CONTRAST CT CERVICAL SPINE WITHOUT CONTRAST TECHNIQUE: Multidetector CT imaging of the head and cervical spine was performed following the standard protocol without intravenous contrast. Multiplanar CT image reconstructions of the cervical spine were also generated. RADIATION DOSE REDUCTION: This exam was performed according to the departmental dose-optimization program which includes automated exposure control, adjustment of the mA and/or kV according to patient size and/or use of iterative reconstruction technique. COMPARISON:  None Available. FINDINGS: CT HEAD FINDINGS Brain: No evidence of acute infarction, hemorrhage, hydrocephalus, extra-axial collection or mass lesion/mass effect. Vascular: No hyperdense vessel or  unexpected calcification. Skull: Normal. Negative for fracture or focal lesion. Sinuses/Orbits: No acute finding. Other: None. CT CERVICAL SPINE FINDINGS Alignment: Normal. Skull base and vertebrae: No acute fracture. No primary bone lesion or focal pathologic process. Soft tissues and spinal canal: No prevertebral fluid or swelling. No visible canal hematoma. Disc levels:  Intact. Upper chest: Negative. Other: Tracheostomy. IMPRESSION: 1. No acute intracranial pathology. 2. No fracture or static subluxation of the cervical spine. Electronically Signed   By: Jearld Lesch M.D.   On: 03/10/2023 15:08   CT Head Wo Contrast  Result Date: 03/10/2023 CLINICAL DATA:  Trauma, headache and weakness EXAM: CT HEAD WITHOUT CONTRAST CT CERVICAL  SPINE WITHOUT CONTRAST TECHNIQUE: Multidetector CT imaging of the head and cervical spine was performed following the standard protocol without intravenous contrast. Multiplanar CT image reconstructions of the cervical spine were also generated. RADIATION DOSE REDUCTION: This exam was performed according to the departmental dose-optimization program which includes automated exposure control, adjustment of the mA and/or kV according to patient size and/or use of iterative reconstruction technique. COMPARISON:  None Available. FINDINGS: CT HEAD FINDINGS Brain: No evidence of acute infarction, hemorrhage, hydrocephalus, extra-axial collection or mass lesion/mass effect. Vascular: No hyperdense vessel or unexpected calcification. Skull: Normal. Negative for fracture or focal lesion. Sinuses/Orbits: No acute finding. Other: None. CT CERVICAL SPINE FINDINGS Alignment: Normal. Skull base and vertebrae: No acute fracture. No primary bone lesion or focal pathologic process. Soft tissues and spinal canal: No prevertebral fluid or swelling. No visible canal hematoma. Disc levels:  Intact. Upper chest: Negative. Other: Tracheostomy. IMPRESSION: 1. No acute intracranial pathology. 2. No fracture  or static subluxation of the cervical spine. Electronically Signed   By: Jearld Lesch M.D.   On: 03/10/2023 15:08   ECHOCARDIOGRAM COMPLETE  Result Date: 02/19/2023    ECHOCARDIOGRAM REPORT   Patient Name:   HAZLEY DEZEEUW Date of Exam: 02/19/2023 Medical Rec #:  [id number]                    Height:       61.0 in Accession #:    [id number]                   Weight:       266.0 lb Date of Birth:  [birth year]                   BSA:          2.132 m Patient Age:    51 years                     BP:           140/82 mmHg Patient Gender: F                            HR:           97 bpm. Exam Location:  [address] Procedure: 2D Echo, Cardiac Doppler, Color Doppler and Intracardiac            Opacification Agent Indications:    Chronic diastolic heart failure I50.32  History:        Patient has prior history of Echocardiogram examinations, most                 recent 06/04/2017. COPD; Risk Factors:Diabetes, Dyslipidemia and                 Hypertension.  Sonographer:    Thurman Coyer RDCS Referring Phys: [id number] [facility] [place]  Sonographer Comments: Technically challenging study due to limited acoustic windows, Technically difficult study due to poor echo windows, suboptimal apical window and no SSN Window due to Tracheostomy. IMPRESSIONS  1. Left ventricular ejection fraction, by estimation, is 50%. The left ventricle has mildly decreased function. The left ventricle demonstrates global hypokinesis. Left ventricular diastolic parameters are consistent with Grade I diastolic dysfunction (impaired relaxation).  2. Right ventricular systolic function is mildly reduced. The right ventricular size is normal. Tricuspid regurgitation signal is inadequate for assessing PA pressure.  3. The mitral valve is normal in  structure. No evidence of mitral valve regurgitation. No evidence of mitral stenosis.  4. The aortic valve is tricuspid. Aortic valve regurgitation is not visualized. No aortic stenosis is  present.  5. Aortic dilatation noted. There is mild dilatation of the ascending aorta, measuring 41 mm.  6. The inferior vena cava is normal in size with greater than 50% respiratory variability, suggesting right atrial pressure of 3 mmHg. FINDINGS  Left Ventricle: Left ventricular ejection fraction, by estimation, is 50%. The left ventricle has mildly decreased function. The left ventricle demonstrates global hypokinesis. Definity contrast agent was given IV to delineate the left ventricular endocardial borders. The left ventricular internal cavity size was normal in size. There is no left ventricular hypertrophy. Left ventricular diastolic parameters are consistent with Grade I diastolic dysfunction (impaired relaxation). Right Ventricle: The right ventricular size is normal. No increase in right ventricular wall thickness. Right ventricular systolic function is mildly reduced. Tricuspid regurgitation signal is inadequate for assessing PA pressure. Left Atrium: Left atrial size was normal in size. Right Atrium: Right atrial size was normal in size. Pericardium: There is no evidence of pericardial effusion. Mitral Valve: The mitral valve is normal in structure. No evidence of mitral valve regurgitation. No evidence of mitral valve stenosis. Tricuspid Valve: The tricuspid valve is normal in structure. Tricuspid valve regurgitation is not demonstrated. Aortic Valve: The aortic valve is tricuspid. Aortic valve regurgitation is not visualized. No aortic stenosis is present. Pulmonic Valve: The pulmonic valve was normal in structure. Pulmonic valve regurgitation is not visualized. Aorta: The aortic root is normal in size and structure and aortic dilatation noted. There is mild dilatation of the ascending aorta, measuring 41 mm. Venous: The inferior vena cava is normal in size with greater than 50% respiratory variability, suggesting right atrial pressure of 3 mmHg. IAS/Shunts: No atrial level shunt detected by color  flow Doppler.  LEFT VENTRICLE PLAX 2D LVIDd:         4.60 cm   Diastology LVIDs:         3.70 cm   LV e' medial:    6.31 cm/s LV PW:         1.00 cm   LV E/e' medial:  11.7 LV IVS:        1.10 cm   LV e' lateral:   9.90 cm/s LVOT diam:     2.10 cm   LV E/e' lateral: 7.5 LV SV:         50 LV SV Index:   23 LVOT Area:     3.46 cm                           3D Volume EF:                          3D EF:        59 %                          LV EDV:       101 ml                          LV ESV:       41 ml                          LV  SV:        60 ml RIGHT VENTRICLE RV Basal diam:  2.80 cm RV Mid diam:    2.60 cm RV S prime:     8.70 cm/s TAPSE (M-mode): 1.8 cm LEFT ATRIUM           Index        RIGHT ATRIUM           Index LA diam:      3.50 cm 1.64 cm/m   RA Area:     12.20 cm LA Vol (A2C): 17.4 ml 8.16 ml/m   RA Volume:   28.40 ml  13.32 ml/m LA Vol (A4C): 33.5 ml 15.71 ml/m  AORTIC VALVE LVOT Vmax:   79.70 cm/s LVOT Vmean:  53.500 cm/s LVOT VTI:    0.144 m  AORTA Ao Root diam: 2.70 cm Ao Asc diam:  4.10 cm MITRAL VALVE MV Area (PHT): 5.02 cm    SHUNTS MV Decel Time: 151 msec    Systemic VTI:  0.14 m MV E velocity: 73.90 cm/s  Systemic Diam: 2.10 cm MV A velocity: 83.80 cm/s MV E/A ratio:  0.88 Dalton McleanMD Electronically signed by Wilfred Lacy Signature Date/Time: 02/19/2023/9:13:56 AM    Final    CT Angio Chest Pulmonary Embolism (PE) W or WO Contrast  Result Date: 02/11/2023 CLINICAL DATA:  Elevated D-dimer. Tracheostomy. History of COPD. Diabetes. Hypertension. Asthma. EXAM: CT ANGIOGRAPHY CHEST WITH CONTRAST TECHNIQUE: Multidetector CT imaging of the chest was performed using the standard protocol during bolus administration of intravenous contrast. Multiplanar CT image reconstructions and MIPs were obtained to evaluate the vascular anatomy. RADIATION DOSE REDUCTION: This exam was performed according to the departmental dose-optimization program which includes automated exposure control, adjustment  of the mA and/or kV according to patient size and/or use of iterative reconstruction technique. CONTRAST:  75mL OMNIPAQUE IOHEXOL 350 MG/ML SOLN COMPARISON:  Chest radiograph of 01/28/2023. Most recent CT 08/16/2022 FINDINGS: Cardiovascular: The quality of this exam for evaluation of pulmonary embolism is poor. In addition to patient body habitus, the bolus is poorly timed, with contrast centered in the SVC. No central or large lobar pulmonary embolism identified. Aortic atherosclerosis. Mild cardiomegaly, without pericardial effusion. Mediastinum/Nodes: Right paratracheal increased density including on 40/4 is present back to 04/01/2021, favoring pericardial recess and possibly a small adjacent node. No hilar adenopathy. Lungs/Pleura: No pleural fluid. Tracheostomy is appropriately positioned. Clear lungs. Upper Abdomen: Marked hepatic steatosis. Normal imaged portions of the spleen, stomach, pancreas, adrenal glands, left kidney. Musculoskeletal: No acute osseous abnormality. Review of the MIP images confirms the above findings. IMPRESSION: 1. Poor quality evaluation for pulmonary embolism secondary to patient body habitus and bolus timing. No central pulmonary embolism. 2.  No acute process in the chest. 3. Marked hepatic steatosis 4.  Aortic Atherosclerosis (ICD10-I70.0). Electronically Signed   By: Jeronimo Greaves M.D.   On: 02/11/2023 15:39       The results of significant diagnostics from this hospitalization (including imaging, microbiology, ancillary and laboratory) are listed below for reference.     Microbiology: No results found for this or any previous visit (from the past 240 hour(s)).   Labs:  CBC: Recent Labs  Lab 03/10/23 1315 03/10/23 1319 03/11/23 0040 03/12/23 0059  WBC 9.6  --  8.8 7.7  NEUTROABS 4.2  --  4.4  --   HGB 11.3* 12.2 11.2* 10.2*  HCT 35.7* 36.0 35.3* 31.4*  MCV 86.4  --  87.2 87.5  PLT 280  --  271  249   BMP &GFR Recent Labs  Lab 03/10/23 1315  03/10/23 1319 03/11/23 0040 03/11/23 2135 03/12/23 0059 03/13/23 0858  NA 137 138 138 138 138 136  K 5.0 5.0 4.9 5.5* 5.2* 4.8  CL 103 107 106 106 107 106  CO2 21*  --  20* 25 23 21*  GLUCOSE 154* 152* 154* 266* 195* 237*  BUN 52* 48* 40* 28* 25* 14  CREATININE 4.22* 4.40* 2.60* 1.67* 1.56* 1.40*  CALCIUM 9.0  --  8.8* 9.3 9.1 8.7*  MG  --   --  1.8 1.7 1.6* 1.5*  PHOS  --   --  3.3  --  3.0 3.5   Estimated Creatinine Clearance: 59.2 mL/min (A) (by C-G formula based on SCr of 1.4 mg/dL (H)). Liver & Pancreas: Recent Labs  Lab 03/10/23 1315 03/11/23 0040 03/11/23 2135 03/12/23 0059 03/13/23 0858  AST 55* 47* 36 33  --   ALT 30 28 26 25   --   ALKPHOS 102 92 97 91  --   BILITOT 0.5 0.7 0.4 0.3  --   PROT 7.7 7.1 6.9 6.4*  --   ALBUMIN 3.6 3.4* 3.1* 3.0* 3.0*   No results for input(s): "LIPASE", "AMYLASE" in the last 168 hours. No results for input(s): "AMMONIA" in the last 168 hours. Diabetic: Recent Labs    03/11/23 0040  HGBA1C 10.3*   Recent Labs  Lab 03/12/23 0635 03/12/23 1137 03/12/23 1626 03/12/23 2119 03/13/23 0615  GLUCAP 123* 161* 210* 168* 142*   Cardiac Enzymes: Recent Labs  Lab 03/11/23 0040 03/12/23 0059 03/13/23 0858  CKTOTAL 3,179* 1,718* 1,086*   Recent Labs    01/15/23 1217 01/28/23 1533  PROBNP <36 9.0   Coagulation Profile: Recent Labs  Lab 03/10/23 1315  INR 1.0   Thyroid Function Tests: No results for input(s): "TSH", "T4TOTAL", "FREET4", "T3FREE", "THYROIDAB" in the last 72 hours. Lipid Profile: No results for input(s): "CHOL", "HDL", "LDLCALC", "TRIG", "CHOLHDL", "LDLDIRECT" in the last 72 hours. Anemia Panel: No results for input(s): "VITAMINB12", "FOLATE", "FERRITIN", "TIBC", "IRON", "RETICCTPCT" in the last 72 hours. Urine analysis:    Component Value Date/Time   COLORURINE YELLOW 03/10/2023 1658   APPEARANCEUR HAZY (A) 03/10/2023 1658   LABSPEC 1.012 03/10/2023 1658   PHURINE 5.0 03/10/2023 1658   GLUCOSEU  NEGATIVE 03/10/2023 1658   HGBUR NEGATIVE 03/10/2023 1658   BILIRUBINUR NEGATIVE 03/10/2023 1658   BILIRUBINUR neg 04/12/2018 1006   KETONESUR NEGATIVE 03/10/2023 1658   PROTEINUR NEGATIVE 03/10/2023 1658   UROBILINOGEN 0.2 04/12/2018 1006   UROBILINOGEN 0.2 11/18/2014 2325   NITRITE NEGATIVE 03/10/2023 1658   LEUKOCYTESUR MODERATE (A) 03/10/2023 1658   Sepsis Labs: Invalid input(s): "PROCALCITONIN", "LACTICIDVEN"   SIGNED:  Almon Hercules, MD  [facility] 03/13/2023, 2:58 PM

## 2023-03-13 NOTE — Progress Notes (Signed)
PT Cancellation Note  Patient Details Name: Maritsa Hunsucker [name] MRN: [id number] DOB: 1970-12-13   Cancelled Treatment:    Reason Eval/Treat Not Completed: Other (comment).  Declines PT today, reattempt as time and pt allow.   Ivar Drape 03/13/2023, 10:50 AM  Samul Dada, PT PhD Acute Rehab Dept. Number: [facility] [id number] and [facility] [phone]

## 2023-03-13 NOTE — Plan of Care (Signed)
  Problem: Education: Goal: Ability to describe self-care measures that may prevent or decrease complications (Diabetes Survival Skills Education) will improve Outcome: Adequate for Discharge Goal: Individualized Educational Video(s) Outcome: Adequate for Discharge   Problem: Coping: Goal: Ability to adjust to condition or change in health will improve Outcome: Adequate for Discharge   Problem: Fluid Volume: Goal: Ability to maintain a balanced intake and output will improve Outcome: Adequate for Discharge   Problem: Health Behavior/Discharge Planning: Goal: Ability to identify and utilize available resources and services will improve Outcome: Adequate for Discharge Goal: Ability to manage health-related needs will improve Outcome: Adequate for Discharge   Problem: Metabolic: Goal: Ability to maintain appropriate glucose levels will improve Outcome: Adequate for Discharge   Problem: Nutritional: Goal: Maintenance of adequate nutrition will improve Outcome: Adequate for Discharge Goal: Progress toward achieving an optimal weight will improve Outcome: Adequate for Discharge   Problem: Skin Integrity: Goal: Risk for impaired skin integrity will decrease Outcome: Adequate for Discharge   Problem: Tissue Perfusion: Goal: Adequacy of tissue perfusion will improve Outcome: Adequate for Discharge   Problem: Education: Goal: Knowledge of General Education information will improve Description: Including pain rating scale, medication(s)/side effects and non-pharmacologic comfort measures Outcome: Adequate for Discharge   Problem: Health Behavior/Discharge Planning: Goal: Ability to manage health-related needs will improve Outcome: Adequate for Discharge   Problem: Clinical Measurements: Goal: Ability to maintain clinical measurements within normal limits will improve Outcome: Adequate for Discharge Goal: Will remain free from infection Outcome: Adequate for Discharge Goal:  Diagnostic test results will improve Outcome: Adequate for Discharge Goal: Respiratory complications will improve Outcome: Adequate for Discharge Goal: Cardiovascular complication will be avoided Outcome: Adequate for Discharge   Problem: Activity: Goal: Risk for activity intolerance will decrease Outcome: Adequate for Discharge   Problem: Nutrition: Goal: Adequate nutrition will be maintained Outcome: Adequate for Discharge   Problem: Coping: Goal: Level of anxiety will decrease Outcome: Adequate for Discharge   Problem: Elimination: Goal: Will not experience complications related to bowel motility Outcome: Adequate for Discharge Goal: Will not experience complications related to urinary retention Outcome: Adequate for Discharge   Problem: Pain Managment: Goal: General experience of comfort will improve Outcome: Adequate for Discharge   Problem: Safety: Goal: Ability to remain free from injury will improve Outcome: Adequate for Discharge   Problem: Skin Integrity: Goal: Risk for impaired skin integrity will decrease Outcome: Adequate for Discharge   Problem: Acute Rehab OT Goals (only OT should resolve) Goal: Pt. Will Perform Grooming Outcome: Adequate for Discharge Goal: Pt. Will Transfer To Toilet Outcome: Adequate for Discharge Goal: Pt. Will Perform Toileting-Clothing Manipulation Outcome: Adequate for Discharge Goal: Pt. Will Perform Tub/Shower Transfer Outcome: Adequate for Discharge Goal: OT Additional ADL Goal #1 Outcome: Adequate for Discharge Goal: OT Additional ADL Goal #2 Outcome: Adequate for Discharge   Problem: Acute Rehab PT Goals(only PT should resolve) Goal: Pt Will Go Supine/Side To Sit Outcome: Adequate for Discharge Goal: Pt Will Go Sit To Supine/Side Outcome: Adequate for Discharge Goal: Patient Will Transfer Sit To/From Stand Outcome: Adequate for Discharge Goal: Pt Will Transfer Bed To Chair/Chair To Bed Outcome: Adequate for  Discharge Goal: Pt Will Ambulate Outcome: Adequate for Discharge

## 2023-03-13 NOTE — Progress Notes (Signed)
Discharge instructions (including medications) discussed with and copy provided to patient/caregiver  Patient ride will be here at lunch time.

## 2023-03-16 ENCOUNTER — Telehealth: Payer: Self-pay

## 2023-03-16 ENCOUNTER — Other Ambulatory Visit (HOSPITAL_COMMUNITY): Payer: Self-pay

## 2023-03-16 ENCOUNTER — Other Ambulatory Visit: Payer: Self-pay | Admitting: Family Medicine

## 2023-03-16 DIAGNOSIS — M5136 Other intervertebral disc degeneration, lumbar region: Secondary | ICD-10-CM | POA: Diagnosis not present

## 2023-03-16 DIAGNOSIS — G43909 Migraine, unspecified, not intractable, without status migrainosus: Secondary | ICD-10-CM | POA: Diagnosis not present

## 2023-03-16 DIAGNOSIS — J9611 Chronic respiratory failure with hypoxia: Secondary | ICD-10-CM | POA: Diagnosis not present

## 2023-03-16 DIAGNOSIS — E1140 Type 2 diabetes mellitus with diabetic neuropathy, unspecified: Secondary | ICD-10-CM | POA: Diagnosis not present

## 2023-03-16 DIAGNOSIS — K2970 Gastritis, unspecified, without bleeding: Secondary | ICD-10-CM | POA: Diagnosis not present

## 2023-03-16 DIAGNOSIS — G5600 Carpal tunnel syndrome, unspecified upper limb: Secondary | ICD-10-CM | POA: Diagnosis not present

## 2023-03-16 DIAGNOSIS — K2990 Gastroduodenitis, unspecified, without bleeding: Secondary | ICD-10-CM | POA: Diagnosis not present

## 2023-03-16 DIAGNOSIS — M1990 Unspecified osteoarthritis, unspecified site: Secondary | ICD-10-CM | POA: Diagnosis not present

## 2023-03-16 DIAGNOSIS — I5032 Chronic diastolic (congestive) heart failure: Secondary | ICD-10-CM | POA: Diagnosis not present

## 2023-03-16 DIAGNOSIS — N1831 Chronic kidney disease, stage 3a: Secondary | ICD-10-CM | POA: Diagnosis not present

## 2023-03-16 DIAGNOSIS — M4316 Spondylolisthesis, lumbar region: Secondary | ICD-10-CM | POA: Diagnosis not present

## 2023-03-16 DIAGNOSIS — E785 Hyperlipidemia, unspecified: Secondary | ICD-10-CM | POA: Diagnosis not present

## 2023-03-16 DIAGNOSIS — E119 Type 2 diabetes mellitus without complications: Secondary | ICD-10-CM

## 2023-03-16 DIAGNOSIS — E1122 Type 2 diabetes mellitus with diabetic chronic kidney disease: Secondary | ICD-10-CM | POA: Diagnosis not present

## 2023-03-16 DIAGNOSIS — E1165 Type 2 diabetes mellitus with hyperglycemia: Secondary | ICD-10-CM | POA: Diagnosis not present

## 2023-03-16 DIAGNOSIS — G4733 Obstructive sleep apnea (adult) (pediatric): Secondary | ICD-10-CM | POA: Diagnosis not present

## 2023-03-16 DIAGNOSIS — J4489 Other specified chronic obstructive pulmonary disease: Secondary | ICD-10-CM | POA: Diagnosis not present

## 2023-03-16 DIAGNOSIS — G894 Chronic pain syndrome: Secondary | ICD-10-CM | POA: Diagnosis not present

## 2023-03-16 DIAGNOSIS — D631 Anemia in chronic kidney disease: Secondary | ICD-10-CM | POA: Diagnosis not present

## 2023-03-16 DIAGNOSIS — K219 Gastro-esophageal reflux disease without esophagitis: Secondary | ICD-10-CM | POA: Diagnosis not present

## 2023-03-16 DIAGNOSIS — J9602 Acute respiratory failure with hypercapnia: Secondary | ICD-10-CM | POA: Diagnosis not present

## 2023-03-16 DIAGNOSIS — T796XXD Traumatic ischemia of muscle, subsequent encounter: Secondary | ICD-10-CM | POA: Diagnosis not present

## 2023-03-16 DIAGNOSIS — I130 Hypertensive heart and chronic kidney disease with heart failure and stage 1 through stage 4 chronic kidney disease, or unspecified chronic kidney disease: Secondary | ICD-10-CM | POA: Diagnosis not present

## 2023-03-16 MED ORDER — FREESTYLE LIBRE 2 SENSOR MISC
1 refills | Status: DC
Start: 2023-03-16 — End: 2023-04-06
  Filled 2023-03-16: qty 2, 28d supply, fill #0

## 2023-03-16 NOTE — Transitions of Care (Post Inpatient/ED Visit) (Signed)
03/16/2023  Name: [name] MRN: [id number] DOB: 1971-06-03  Today's TOC FU Call Status: Today's TOC FU Call Status:: Successful TOC FU Call Competed TOC FU Call Complete Date: 03/16/23  Transition Care Management Follow-up Telephone Call Date of Discharge: 03/13/23 Discharge Facility: [facility]) Type of Discharge: Inpatient Admission Primary Inpatient Discharge Diagnosis:: "AKI" How have you been since you were released from the hospital?: Better (Pt satets she is feeling better than yesterday adn when she was in hospital. She is havign soem chronic back to back-took med an hr ago. Appetite is decreased-she will try nutritional shake. She is managing trach-and no issues.) Any questions or concerns?: No  Items Reviewed: Did you receive and understand the discharge instructions provided?: Yes Medications obtained,verified, and reconciled?: Yes (Medications Reviewed) Any new allergies since your discharge?: No Dietary orders reviewed?: Yes Type of Diet Ordered:: low salt/heaet healthy Do you have support at home?: Yes People in Home: spouse Name of Support/Comfort Primary Source: Leonette Most  Medications Reviewed Today: Medications Reviewed Today     Reviewed by Charlyn Minerva, RN (Registered Nurse) on 03/16/23 at 1104  Med List Status: <None>   Medication Order Taking? Sig Documenting Provider Last Dose Status Informant  acetaminophen (TYLENOL) 500 MG tablet [id number] Yes Take 1,000 mg by mouth 2 (two) times daily as needed for moderate pain, fever or headache. [provider] Taking Active Self  albuterol (PROVENTIL) (2.5 MG/3ML) 0.083% nebulizer solution [id number] Yes Take 3 mLs (2.5 mg total) by nebulization every 6 (six) hours as needed for wheezing or shortness of breath. Hoy Register, MD Taking Active Self, Pharmacy Records  albuterol (VENTOLIN HFA) 108 (90 Base) MCG/ACT inhaler [id number] Yes INHALE 2 PUFFS BY MOUTH EVERY 6 HOURS  AS NEEDED FOR WHEEZING FOR SHORTNESS OF BREATH  Patient taking differently: Inhale 2 puffs into the lungs every 6 (six) hours as needed for shortness of breath or wheezing.   Hoy Register, MD Taking Active Self, Pharmacy Records  amitriptyline (ELAVIL) 10 MG tablet [id number] Yes TAKE 1 TABLET BY MOUTH AT BEDTIME (DISCONTINUE  TOPAMAX)  Patient taking differently: Take 10 mg by mouth at bedtime.   Hoy Register, MD Taking Active Self, Pharmacy Records  amLODipine (NORVASC) 5 MG tablet [id number] Yes Take 1 tablet (5 mg total) by mouth daily. Meriam Sprague, MD Taking Active Self, Pharmacy Records           Med Note (COFFELL, Marzella Schlein   Wed Mar 11, 2023 11:01 AM) Pt verified she is taking the reduced new dose (5mg ).  Blood Glucose Monitoring Suppl (ONETOUCH VERIO FLEX SYSTEM) w/Device KIT [id number]  Use to check glucose levels daily; E11.9 Hoy Register, MD  Active Self  buPROPion (WELLBUTRIN SR) 150 MG 12 hr tablet [id number] Yes Take 1 tablet (150 mg total) by mouth 2 (two) times daily. Hoy Register, MD Taking Active Self, Pharmacy Records  cetirizine (ZYRTEC) 10 MG tablet [id number]  TAKE 1 TABLET BY MOUTH AT BEDTIME  Patient not taking: Reported on 03/11/2023   Charlott Holler, MD  Active Self, Pharmacy Records  Continuous Blood Gluc Receiver (FREESTYLE LIBRE 2 READER) Hardie Pulley [id number]  USE TO CHECK BLOOD SUGAR THREE TIMES DAILY. Hoy Register, MD  Active Self  Continuous Glucose Sensor (FREESTYLE LIBRE 2 SENSOR) MISC [id number]  USE TO CHECK BLOOD GLUCOSE THREE TIMES DAILY.  CHANGE SENSOR ONCE EVERY 14 DAYS. Hoy Register, MD  Active Self  dapagliflozin propanediol (FARXIGA) 10 MG TABS tablet [id number]  Take 1 tablet (10 mg  total) by mouth daily before breakfast. Hoy Register, MD  Active Self, Pharmacy Records    Discontinued 10/15/17 [facility] Shortage)   furosemide (LASIX) 40 MG tablet [id number] Yes Take 1 tablet (40 mg total) by mouth daily as needed for fluid or  edema. Almon Hercules, MD Taking Active   glucose blood (ONETOUCH VERIO) test strip [id number]  Use to check glucose levels daily; E11.9 Hoy Register, MD  Active Self  hydrOXYzine (ATARAX) 25 MG tablet [id number] Yes Take 1 tablet (25 mg total) by mouth 3 (three) times daily as needed. for anxiety  Patient taking differently: Take 25 mg by mouth 3 (three) times daily as needed for anxiety.   Hoy Register, MD Taking Active Self, Pharmacy Records  insulin degludec (TRESIBA FLEXTOUCH) 200 UNIT/ML FlexTouch Pen [id number] Yes Inject 170 Units into the skin daily. Hoy Register, MD Taking Active Self, Pharmacy Records  insulin lispro (HUMALOG KWIKPEN) 100 UNIT/ML KwikPen [id number] Yes Inject 10 Units into the skin 3 (three) times daily.  Patient taking differently: Inject 10 Units into the skin with breakfast, with lunch, and with evening meal.   Hoy Register, MD Taking Active Self, Pharmacy Records  Insulin Pen Needle 31G X 8 MM MISC [id number]  Use to inject Tresiba and Humalog. Pt may use 4 pen needles daily. Hoy Register, MD  Active Self  lisinopril (ZESTRIL) 20 MG tablet [id number]  Take 1 tablet (20 mg total) by mouth daily. Almon Hercules, MD  Active   melatonin 3 MG TABS tablet [id number] No Take 1 tablet (3 mg total) by mouth at bedtime.  Patient not taking: Reported on 03/11/2023   Hoy Register, MD Not Taking Active Self  Misc. Devices MISC [id number]  Nebulizer device. Diagnosis - Asthma Hoy Register, MD  Active Self  Misc. Devices MISC [id number]  Shower chair Hoy Register, MD  Active Self  mometasone-formoterol [facility]) 200-5 MCG/ACT Sandrea Matte [id number] Yes Inhale 2 puffs into the lungs in the morning and at bedtime. Hoy Register, MD Taking Active Self, Pharmacy Records  montelukast (SINGULAIR) 10 MG tablet [id number] Yes Take 1 tablet (10 mg total) by mouth at bedtime. Hoy Register, MD Taking Active Self, Pharmacy Records  ondansetron (ZOFRAN) 4 MG tablet [id number] Yes TAKE 1  TABLET BY MOUTH EVERY 8 HOURS AS NEEDED FOR NAUSEA FOR VOMITING  Patient taking differently: Take 4 mg by mouth every 8 (eight) hours as needed for nausea or vomiting.   Hoy Register, MD Taking Active Self, Pharmacy Records  [facility] Lancets 33G [place] [id number]  Use to check glucose levels daily; E11.9 Hoy Register, MD  Active Self  oxyCODONE-acetaminophen (PERCOCET) 10-325 MG tablet [id number] Yes Take 1 tablet by mouth 4 (four) times daily as needed for pain. [provider] Taking Active Self, Pharmacy Records           Med Note (COFFELL, Marzella Schlein   Wed Mar 11, 2023 11:04 AM)    pantoprazole (PROTONIX) 40 MG tablet [id number] Yes Take 1 tablet (40 mg total) by mouth 2 (two) times daily. Hoy Register, MD Taking Active Self, Pharmacy Records  pregabalin (LYRICA) 50 MG capsule [id number] Yes Take 1 capsule (50 mg total) by mouth in the morning, at noon, and at bedtime. Eldred Manges, MD Taking Active Self, Pharmacy Records  rosuvastatin (CRESTOR) 20 MG tablet [id number]  Take 1 tablet (20 mg total) by mouth every evening. Almon Hercules, MD  Active   sertraline (ZOLOFT) 100 MG tablet [id number] Yes Take 1 tablet (100  mg total) by mouth at bedtime.  Patient taking differently: Take 100 mg by mouth daily.   Hoy Register, MD Taking Active Self, Pharmacy Records  tirzepatide [facility]) 7.5 MG/0.5ML Pen [id number] Yes Inject 7.5 mg into the skin once a week. For 4 weeks then increase to 10 mg once a week  Patient taking differently: Inject 7.5 mg into the skin every Sunday.   Hoy Register, MD Taking Active Self, Pharmacy Records  tiZANidine (ZANAFLEX) 4 MG tablet [id number] Yes TAKE 1 TABLET BY MOUTH EVERY 8 HOURS AS NEEDED FOR MUSCLE SPASM  Patient taking differently: Take 4 mg by mouth every 8 (eight) hours as needed for muscle spasms.   Hoy Register, MD Taking Active Self, Pharmacy Records  Vitamin D, Ergocalciferol, (DRISDOL) 1.25 MG (50000 UNIT) CAPS capsule [id number] Yes  Take 1 capsule (50,000 Units total) by mouth every 7 (seven) days. Kerrin Champagne, MD Taking Active Self, Pharmacy Records            Home Care and Equipment/Supplies: Were Home Health Services Ordered?: Yes Name of Home Health Agency:: [facility] Has Agency set up a time to come to your home?: No (Confirmed pt has agency contact info-she will call and f/u if she does not heard from them today) Any new equipment or medical supplies ordered?: Yes Name of Medical supply agency?: [facility] Were you able to get the equipment/medical supplies?: Yes Do you have any questions related to the use of the equipment/supplies?: No  Functional Questionnaire: Do you need assistance with bathing/showering or dressing?: Yes (pt states he has aide that comes in several hrs/day to assist her [facility]) Do you need assistance with meal preparation?: Yes Do you need assistance with eating?: No Do you have difficulty maintaining continence: No Do you need assistance with getting out of bed/getting out of a chair/moving?: No Do you have difficulty managing or taking your medications?: No  Follow up appointments reviewed: PCP Follow-up appointment confirmed?: Yes Date of PCP follow-up appointment?: 04/06/23 Follow-up Provider: Dr. Alvis Lemmings Specialist [facility] Follow-up appointment confirmed?: No Reason Specialist Follow-Up Not Confirmed: Patient has Specialist Provider Number and will Call for Appointment (Renal MD office to call pt with appt-she will f/u if she does not hear from them) Do you need transportation to your follow-up appointment?: No Do you understand care options if your condition(s) worsen?: Yes-patient verbalized understanding   TOC Interventions Today    Flowsheet Row Most Recent Value  TOC Interventions   TOC Interventions Discussed/Reviewed TOC Interventions Discussed, Arranged PCP follow up less than 12 days/Care Guide scheduled      Interventions  Today    Flowsheet Row Most Recent Value  General Interventions   General Interventions Discussed/Reviewed General Interventions Discussed, Doctor Visits, Durable Medical Equipment (DME)  Doctor Visits Discussed/Reviewed Doctor Visits Discussed, PCP, Specialist  Durable Medical Equipment (DME) Other  [trach-pt performs trach care]  PCP/Specialist Visits Compliance with follow-up visit  Education Interventions   Education Provided Provided Education  Provided Verbal Education On Nutrition, When to see the doctor, Medication  Nutrition Interventions   Nutrition Discussed/Reviewed Nutrition Discussed, Increasing proteins, Decreasing fats, Decreasing salt, Adding fruits and vegetables  Pharmacy Interventions   Pharmacy Dicussed/Reviewed Pharmacy Topics Discussed, Medications and their functions  Safety Interventions   Safety Discussed/Reviewed Safety Discussed, Home Safety  Home Safety Assistive Devices       [place], [place] [facility] Care Management Care Management Community Coordinator Direct Phone: [phone] Toll Free: [phone] Fax: [phone]

## 2023-03-17 ENCOUNTER — Ambulatory Visit
Admission: RE | Admit: 2023-03-17 | Discharge: 2023-03-17 | Disposition: A | Discharge status: Home / Self Care | Payer: 59 | Source: Ambulatory Visit | Attending: Orthopedic Surgery | Admitting: Orthopedic Surgery

## 2023-03-17 ENCOUNTER — Telehealth: Payer: Self-pay | Admitting: Family Medicine

## 2023-03-17 ENCOUNTER — Other Ambulatory Visit: Payer: Self-pay

## 2023-03-17 DIAGNOSIS — M5416 Radiculopathy, lumbar region: Secondary | ICD-10-CM

## 2023-03-17 DIAGNOSIS — M545 Low back pain, unspecified: Secondary | ICD-10-CM | POA: Diagnosis not present

## 2023-03-17 DIAGNOSIS — M4326 Fusion of spine, lumbar region: Secondary | ICD-10-CM

## 2023-03-17 NOTE — Telephone Encounter (Signed)
Copied from CRM [id number]. Topic: Quick Communication - Home Health Verbal Orders >> Mar 17, 2023  3:22 PM Everette C wrote: Caller/Agency: Skip Mayer Number: [phone] Requesting OT/PT/Skilled Nursing/Social Work/Speech Therapy: PT Frequency: 1w9   Patient does not want OT

## 2023-03-18 NOTE — Telephone Encounter (Signed)
VO given to Angie with [facility] for PT orders.  She stated patient would benefit for nursing orders as well but is unable to take them. She was going to have the office fax over order.

## 2023-03-25 ENCOUNTER — Telehealth: Payer: Self-pay | Admitting: Family Medicine

## 2023-03-25 DIAGNOSIS — Z79899 Other long term (current) drug therapy: Secondary | ICD-10-CM | POA: Diagnosis not present

## 2023-03-25 DIAGNOSIS — T796XXD Traumatic ischemia of muscle, subsequent encounter: Secondary | ICD-10-CM | POA: Diagnosis not present

## 2023-03-25 DIAGNOSIS — E1165 Type 2 diabetes mellitus with hyperglycemia: Secondary | ICD-10-CM | POA: Diagnosis not present

## 2023-03-25 DIAGNOSIS — M4316 Spondylolisthesis, lumbar region: Secondary | ICD-10-CM | POA: Diagnosis not present

## 2023-03-25 DIAGNOSIS — M5416 Radiculopathy, lumbar region: Secondary | ICD-10-CM | POA: Diagnosis not present

## 2023-03-25 DIAGNOSIS — E1122 Type 2 diabetes mellitus with diabetic chronic kidney disease: Secondary | ICD-10-CM | POA: Diagnosis not present

## 2023-03-25 DIAGNOSIS — D631 Anemia in chronic kidney disease: Secondary | ICD-10-CM | POA: Diagnosis not present

## 2023-03-25 DIAGNOSIS — N1831 Chronic kidney disease, stage 3a: Secondary | ICD-10-CM | POA: Diagnosis not present

## 2023-03-25 DIAGNOSIS — G4733 Obstructive sleep apnea (adult) (pediatric): Secondary | ICD-10-CM | POA: Diagnosis not present

## 2023-03-25 DIAGNOSIS — J9611 Chronic respiratory failure with hypoxia: Secondary | ICD-10-CM | POA: Diagnosis not present

## 2023-03-25 DIAGNOSIS — E1140 Type 2 diabetes mellitus with diabetic neuropathy, unspecified: Secondary | ICD-10-CM | POA: Diagnosis not present

## 2023-03-25 DIAGNOSIS — I5032 Chronic diastolic (congestive) heart failure: Secondary | ICD-10-CM | POA: Diagnosis not present

## 2023-03-25 DIAGNOSIS — K2990 Gastroduodenitis, unspecified, without bleeding: Secondary | ICD-10-CM | POA: Diagnosis not present

## 2023-03-25 DIAGNOSIS — G5600 Carpal tunnel syndrome, unspecified upper limb: Secondary | ICD-10-CM | POA: Diagnosis not present

## 2023-03-25 DIAGNOSIS — J9602 Acute respiratory failure with hypercapnia: Secondary | ICD-10-CM | POA: Diagnosis not present

## 2023-03-25 DIAGNOSIS — E785 Hyperlipidemia, unspecified: Secondary | ICD-10-CM | POA: Diagnosis not present

## 2023-03-25 DIAGNOSIS — K219 Gastro-esophageal reflux disease without esophagitis: Secondary | ICD-10-CM | POA: Diagnosis not present

## 2023-03-25 DIAGNOSIS — K2970 Gastritis, unspecified, without bleeding: Secondary | ICD-10-CM | POA: Diagnosis not present

## 2023-03-25 DIAGNOSIS — G43909 Migraine, unspecified, not intractable, without status migrainosus: Secondary | ICD-10-CM | POA: Diagnosis not present

## 2023-03-25 DIAGNOSIS — M1990 Unspecified osteoarthritis, unspecified site: Secondary | ICD-10-CM | POA: Diagnosis not present

## 2023-03-25 DIAGNOSIS — J4489 Other specified chronic obstructive pulmonary disease: Secondary | ICD-10-CM | POA: Diagnosis not present

## 2023-03-25 DIAGNOSIS — M5136 Other intervertebral disc degeneration, lumbar region: Secondary | ICD-10-CM | POA: Diagnosis not present

## 2023-03-25 DIAGNOSIS — G894 Chronic pain syndrome: Secondary | ICD-10-CM | POA: Diagnosis not present

## 2023-03-25 DIAGNOSIS — I130 Hypertensive heart and chronic kidney disease with heart failure and stage 1 through stage 4 chronic kidney disease, or unspecified chronic kidney disease: Secondary | ICD-10-CM | POA: Diagnosis not present

## 2023-03-25 NOTE — Progress Notes (Signed)
I called review.  Can you get her set up with injections with Dr. Alvester Morin.  MRI scan shows nothing operative..  Thanks

## 2023-03-25 NOTE — Telephone Encounter (Signed)
Home Health Verbal Orders - Caller/Agency: Lorena with [facility]  Callback Number: [phone]  Requesting Verbal orders for disease management & education  Frequency: Once every other week for 8 weeks and 2 PRN visits    Era Bumpers stated these orders would be faxed over today, 03/25/2023, to fax # [phone].  [facility] Fax #: [phone]

## 2023-03-25 NOTE — Telephone Encounter (Signed)
Orders has been received and will be faxed on Friday once complete.

## 2023-03-27 ENCOUNTER — Other Ambulatory Visit: Payer: Self-pay

## 2023-03-27 DIAGNOSIS — T796XXD Traumatic ischemia of muscle, subsequent encounter: Secondary | ICD-10-CM | POA: Diagnosis not present

## 2023-03-27 DIAGNOSIS — M1990 Unspecified osteoarthritis, unspecified site: Secondary | ICD-10-CM | POA: Diagnosis not present

## 2023-03-27 DIAGNOSIS — G894 Chronic pain syndrome: Secondary | ICD-10-CM | POA: Diagnosis not present

## 2023-03-27 DIAGNOSIS — M5416 Radiculopathy, lumbar region: Secondary | ICD-10-CM

## 2023-03-27 DIAGNOSIS — M5136 Other intervertebral disc degeneration, lumbar region: Secondary | ICD-10-CM | POA: Diagnosis not present

## 2023-03-27 DIAGNOSIS — D631 Anemia in chronic kidney disease: Secondary | ICD-10-CM | POA: Diagnosis not present

## 2023-03-27 DIAGNOSIS — E1165 Type 2 diabetes mellitus with hyperglycemia: Secondary | ICD-10-CM | POA: Diagnosis not present

## 2023-03-27 DIAGNOSIS — E1140 Type 2 diabetes mellitus with diabetic neuropathy, unspecified: Secondary | ICD-10-CM | POA: Diagnosis not present

## 2023-03-27 DIAGNOSIS — K2970 Gastritis, unspecified, without bleeding: Secondary | ICD-10-CM | POA: Diagnosis not present

## 2023-03-27 DIAGNOSIS — E1122 Type 2 diabetes mellitus with diabetic chronic kidney disease: Secondary | ICD-10-CM | POA: Diagnosis not present

## 2023-03-27 DIAGNOSIS — G4733 Obstructive sleep apnea (adult) (pediatric): Secondary | ICD-10-CM | POA: Diagnosis not present

## 2023-03-27 DIAGNOSIS — K219 Gastro-esophageal reflux disease without esophagitis: Secondary | ICD-10-CM | POA: Diagnosis not present

## 2023-03-27 DIAGNOSIS — K2990 Gastroduodenitis, unspecified, without bleeding: Secondary | ICD-10-CM | POA: Diagnosis not present

## 2023-03-27 DIAGNOSIS — I5032 Chronic diastolic (congestive) heart failure: Secondary | ICD-10-CM | POA: Diagnosis not present

## 2023-03-27 DIAGNOSIS — G5600 Carpal tunnel syndrome, unspecified upper limb: Secondary | ICD-10-CM | POA: Diagnosis not present

## 2023-03-27 DIAGNOSIS — I130 Hypertensive heart and chronic kidney disease with heart failure and stage 1 through stage 4 chronic kidney disease, or unspecified chronic kidney disease: Secondary | ICD-10-CM | POA: Diagnosis not present

## 2023-03-27 DIAGNOSIS — J9602 Acute respiratory failure with hypercapnia: Secondary | ICD-10-CM | POA: Diagnosis not present

## 2023-03-27 DIAGNOSIS — J9611 Chronic respiratory failure with hypoxia: Secondary | ICD-10-CM | POA: Diagnosis not present

## 2023-03-27 DIAGNOSIS — N1831 Chronic kidney disease, stage 3a: Secondary | ICD-10-CM | POA: Diagnosis not present

## 2023-03-27 DIAGNOSIS — E785 Hyperlipidemia, unspecified: Secondary | ICD-10-CM | POA: Diagnosis not present

## 2023-03-27 DIAGNOSIS — G43909 Migraine, unspecified, not intractable, without status migrainosus: Secondary | ICD-10-CM | POA: Diagnosis not present

## 2023-03-27 DIAGNOSIS — J4489 Other specified chronic obstructive pulmonary disease: Secondary | ICD-10-CM | POA: Diagnosis not present

## 2023-03-27 DIAGNOSIS — M4316 Spondylolisthesis, lumbar region: Secondary | ICD-10-CM | POA: Diagnosis not present

## 2023-03-27 NOTE — Progress Notes (Signed)
Order in chart for FN referral [facility]

## 2023-03-30 ENCOUNTER — Other Ambulatory Visit: Payer: Self-pay | Admitting: Family Medicine

## 2023-03-30 DIAGNOSIS — G43119 Migraine with aura, intractable, without status migrainosus: Secondary | ICD-10-CM

## 2023-03-30 MED ORDER — AMITRIPTYLINE HCL 10 MG PO TABS
10.0000 mg | ORAL_TABLET | Freq: Every day | ORAL | 0 refills | Status: DC
Start: 2023-03-30 — End: 2023-06-16
  Filled 2023-03-30: qty 90, 90d supply, fill #0

## 2023-03-31 ENCOUNTER — Other Ambulatory Visit: Payer: Self-pay | Admitting: Family Medicine

## 2023-03-31 ENCOUNTER — Other Ambulatory Visit: Payer: Self-pay

## 2023-03-31 MED ORDER — TIZANIDINE HCL 4 MG PO TABS
4.0000 mg | ORAL_TABLET | Freq: Three times a day (TID) | ORAL | 3 refills | Status: DC | PRN
  Filled 2023-03-31: qty 90, 30d supply, fill #0

## 2023-04-01 ENCOUNTER — Other Ambulatory Visit (HOSPITAL_COMMUNITY): Payer: Self-pay

## 2023-04-01 ENCOUNTER — Other Ambulatory Visit: Payer: Self-pay

## 2023-04-06 ENCOUNTER — Ambulatory Visit: Payer: 59 | Attending: Family Medicine | Admitting: Family Medicine

## 2023-04-06 ENCOUNTER — Other Ambulatory Visit: Payer: Self-pay | Admitting: Family Medicine

## 2023-04-06 ENCOUNTER — Other Ambulatory Visit: Payer: Self-pay

## 2023-04-06 ENCOUNTER — Encounter: Payer: Self-pay | Admitting: Family Medicine

## 2023-04-06 VITALS — BP 141/93 | HR 108 | Ht 61.0 in | Wt 259.4 lb

## 2023-04-06 DIAGNOSIS — N179 Acute kidney failure, unspecified: Secondary | ICD-10-CM

## 2023-04-06 DIAGNOSIS — G4709 Other insomnia: Secondary | ICD-10-CM

## 2023-04-06 DIAGNOSIS — J9610 Chronic respiratory failure, unspecified whether with hypoxia or hypercapnia: Secondary | ICD-10-CM | POA: Diagnosis not present

## 2023-04-06 DIAGNOSIS — J449 Chronic obstructive pulmonary disease, unspecified: Secondary | ICD-10-CM | POA: Diagnosis not present

## 2023-04-06 DIAGNOSIS — T796XXD Traumatic ischemia of muscle, subsequent encounter: Secondary | ICD-10-CM

## 2023-04-06 DIAGNOSIS — G43119 Migraine with aura, intractable, without status migrainosus: Secondary | ICD-10-CM

## 2023-04-06 DIAGNOSIS — G894 Chronic pain syndrome: Secondary | ICD-10-CM

## 2023-04-06 DIAGNOSIS — B3731 Acute candidiasis of vulva and vagina: Secondary | ICD-10-CM

## 2023-04-06 DIAGNOSIS — Z794 Long term (current) use of insulin: Secondary | ICD-10-CM

## 2023-04-06 DIAGNOSIS — J45909 Unspecified asthma, uncomplicated: Secondary | ICD-10-CM | POA: Diagnosis not present

## 2023-04-06 DIAGNOSIS — J4540 Moderate persistent asthma, uncomplicated: Secondary | ICD-10-CM

## 2023-04-06 DIAGNOSIS — Z7984 Long term (current) use of oral hypoglycemic drugs: Secondary | ICD-10-CM | POA: Diagnosis not present

## 2023-04-06 DIAGNOSIS — G4733 Obstructive sleep apnea (adult) (pediatric): Secondary | ICD-10-CM

## 2023-04-06 DIAGNOSIS — R42 Dizziness and giddiness: Secondary | ICD-10-CM

## 2023-04-06 DIAGNOSIS — F330 Major depressive disorder, recurrent, mild: Secondary | ICD-10-CM | POA: Diagnosis not present

## 2023-04-06 DIAGNOSIS — J4550 Severe persistent asthma, uncomplicated: Secondary | ICD-10-CM | POA: Diagnosis not present

## 2023-04-06 DIAGNOSIS — Z7985 Long-term (current) use of injectable non-insulin antidiabetic drugs: Secondary | ICD-10-CM

## 2023-04-06 MED ORDER — SERTRALINE HCL 100 MG PO TABS: 200.0000 mg | ORAL_TABLET | Freq: Every day | ORAL | 1 refills | Status: DC

## 2023-04-06 MED ORDER — MISC. DEVICES MISC: 0 refills | Status: DC

## 2023-04-06 NOTE — Telephone Encounter (Signed)
Requested Prescriptions  Pending Prescriptions Disp Refills   Continuous Glucose Sensor (FREESTYLE LIBRE 2 SENSOR) MISC [Pharmacy Med Name: FREESTYLE LIBRE 2 SENSOR KIT] 6 each 0    Sig: USE TO CHECK BLOOD GLUCOSE THREE TIMES DAILY. CHANGE SENSOR ONCE  EVERY 14 DAYS     Endocrinology: Diabetes - Testing Supplies Passed - 04/06/2023  4:08 AM      Passed - Valid encounter within last 12 months    Recent Outpatient Visits           Today Dizziness   [place] [facility] [place], [place], MD   2 months ago Type 1 diabetes mellitus without complication [facility])   [place] [facility] [place], [place], MD   5 months ago Type 1 diabetes mellitus with other specified complication [facility])   [place] [facility] [place], Angie, MD   9 months ago Type 1 diabetes mellitus with other specified complication [facility])   [place] [facility] [place], [place], MD   12 months ago Type 1 diabetes mellitus with other specified complication [facility])   [facility] Hoy Register, MD       Future Appointments             In 4 days Juanda Chance, NP [facility] Physiatry   In 2 weeks Hoy Register, MD [facility]   In 2 months Shari Prows, Kathlynn Grate, MD [facility]             fluconazole (DIFLUCAN) 150 MG tablet [Pharmacy Med Name: Fluconazole 150 MG Oral Tablet] 1 tablet 0    Sig: TAKE ONE TABLET BY MOUTH AS A ONE-TIME DOSE     Off-Protocol Failed - 04/06/2023  4:08 AM      Failed - Medication not assigned to a protocol, review manually.      Passed - Valid encounter within last 12 months    Recent Outpatient Visits           Today Dizziness   [place] [facility] [place], [place], MD   2 months ago Type 1 diabetes mellitus without complication [facility])   [facility] [place], [place], MD   5 months ago Type 1 diabetes mellitus with other specified complication [facility])   [place] [facility] [place], [place], MD   9 months ago Type 1 diabetes mellitus with other specified complication [facility])   [place] [facility] [place], Odette Horns, MD   12 months ago Type 1 diabetes mellitus with other specified complication [facility])   [place] [facility] Hoy Register, MD       Future Appointments             In 4 days Juanda Chance, NP [facility] Physiatry   In 2 weeks Hoy Register, MD [place] [facility]   In 2 months Shari Prows, Kathlynn Grate, MD [facility] at [facility]

## 2023-04-06 NOTE — Telephone Encounter (Signed)
Requested Prescriptions  Pending Prescriptions Disp Refills   Continuous Glucose Sensor (FREESTYLE LIBRE 2 SENSOR) MISC [Pharmacy Med Name: FREESTYLE LIBRE 2 SENSOR KIT] 2 each 0    Sig: USE TO CHECK BLOOD GLUCOSE THREE TIMES DAILY.  CHANGE SENSOR ONCE EVERY 14 DAYS.     Endocrinology: Diabetes - Testing Supplies Passed - 04/06/2023  4:08 AM      Passed - Valid encounter within last 12 months    Recent Outpatient Visits           Today Dizziness   [place] [facility] [place], Odette Horns, MD   2 months ago Type 1 diabetes mellitus without complication [facility])   [place] [facility] [place], [place], MD   5 months ago Type 1 diabetes mellitus with other specified complication [facility] [place])   [place] [facility] [place], [place], MD   9 months ago Type 1 diabetes mellitus with other specified complication [facility])   [place] [facility] [place], Odette Horns, MD   12 months ago Type 1 diabetes mellitus with other specified complication [facility])   [place] [facility] Hoy Register, MD       Future Appointments             In 4 days Juanda Chance, NP [facility] Care Physiatry   In 2 weeks Hoy Register, MD [facility]   In 2 months Shari Prows, Kathlynn Grate, MD [facility]

## 2023-04-06 NOTE — Progress Notes (Signed)
Subjective:  Patient ID: [name], female    DOB: 01-08-1971  Age: 52 y.o. MRN: [id number]  CC: Hospitalization Follow-up   [name] is a 52 y.o. year old female with a history of type 1 diabetes mellitus (A1c 9.5), hypertension, obstructive sleep apnea, asthma, depression, s/p tracheostomy secondary to intubation related tracheal injury, subglottic stenosis (status post balloon dilatation and Kenalog injection of stenosis tracheostomy tube exchange by ENT, status post right rotator cuff surgery in 07/2019. Uses 2L oxygen at night, s/p R L4L5 transforaminal lumbar interbody fusion with rods, screws and cage, local bone graft, allogen bone graft, vivigen in 09/2021   She was hospitalized after a fall for rhabdomyolysis and AKI.  Prior to that she had been dizzy and this led to a fall.  CK was elevated (up to 3,179) and so was her creatinine (up to 4.40) discharge CK was 1086 and creatinine 1.4. CT head and MRI brain were unremarkable.  Interval History: Discussed the use of AI scribe software for clinical note transcription with the patient, who gave verbal consent to proceed.    The patient has been experiencing dizzy spells for a while, which occur both when sitting and standing. The patient also reports a new type of headache, different from her usual migraines. The patient describes the new headache as a stabbing pain in one spot on the right side of her face and head, which has been ongoing since the previous night.  She has been on Elavil and sumatriptan for her migraines as Topamax was ineffective in the past. The patient also reports chronic pain throughout her body, which she believes may be due to arthritis.  She has chronic back pain and is on oxycodone from pain management which she takes in addition to Lyrica. The patient also reports difficulty sleeping, often only sleeping for about an hour at a time. The patient has a tracheostomy, which  she reports has made it more difficult to sleep and breathe, especially during the summer months.  Even though she has obstructive sleep apnea she has not been using a CPAP machine she states she cannot use anything on her face that she is unable to breathe but does use oxygen from her trach sometimes at night.  Endorses snoring.       Past Medical History:  Diagnosis Date   Abnormal UGI series    Acute pain of right shoulder due to trauma 10/05/2018   Acute respiratory failure with hypoxia and hypercapnia (HCC) 10/23/2018   Allergy    Anemia    Anxiety    Arthritis    Asthma    Chest pain of uncertain etiology 03/22/2019   Chronic back pain    Chronic chest pain    resolved, no problems since 2019 per patient 07/27/19   Coffee ground vomiting 10/14/2021   COPD (chronic obstructive pulmonary disease) (HCC)    inhaler   Depression    DM (diabetes mellitus) (HCC)    INSULIN DEPENDENT - TYPE 1   Dyspnea    GERD (gastroesophageal reflux disease)    Headache(784.0)    History of hiatal hernia    Hyperlipidemia    no med, diet controlled   Hypertension    Hypokalemia    Klebsiella cystitis 03/14/2017   Last Assessment & Plan:   Formatting of this note might be different from the original.  - On Zosyn  - Urine culture: Klebsiella pneumoniae   Neuromuscular disorder (HCC)    Osteoarthritis  Oxygen deficiency    Pneumonia    Respiratory disease 05/2017   uses inhalers, neb tx prn, no oxygen   Rotator cuff tear 07/28/2019   Sleep apnea    does not use CPAP due to trach, uses 2L supplement oxygen at night   Status post tracheostomy (HCC) 03/14/2017   Last Assessment & Plan:  Formatting of this note might be different from the original. - VERY CRITICAL AIRWAY WITH EXTREME PRECUATION - On tach - ENT following along (please see above for current trach management plan)   Tracheostomy in place [facility]) 02/2017    Past Surgical History:  Procedure Laterality Date   ABDOMINAL  HYSTERECTOMY  2009   APPENDECTOMY     BIOPSY  10/15/2021   Procedure: BIOPSY;  Surgeon: Tressia Danas, MD;  Location: [facility] ENDOSCOPY;  Service: Gastroenterology;;   CESAREAN SECTION     x 3   CHOLECYSTECTOMY N/A 03/02/2014   Procedure: LAPAROSCOPIC CHOLECYSTECTOMY;  Surgeon: Clovis Pu. Cornett, MD;  Location: [facility] OR;  Service: General;  Laterality: N/A;   ESOPHAGOGASTRODUODENOSCOPY (EGD) WITH PROPOFOL N/A 10/15/2021   Procedure: ESOPHAGOGASTRODUODENOSCOPY (EGD) WITH PROPOFOL;  Surgeon: Tressia Danas, MD;  Location: [facility] ENDOSCOPY;  Service: Gastroenterology;  Laterality: N/A;   HERNIA REPAIR     PANENDOSCOPY N/A 03/04/2017   Procedure: PANENDOSCOPY WITH POSSIBLE FOREIGN BODY REMOVAL;  Surgeon: Flo Shanks, MD;  Location: [facility] OR;  Service: ENT;  Laterality: N/A;   ROTATOR CUFF REPAIR Left    ROTATOR CUFF REPAIR Right 07/27/2019   SHOULDER ARTHROSCOPY WITH SUBACROMIAL DECOMPRESSION, ROTATOR CUFF REPAIR AND BICEP TENDON REPAIR Right 07/28/2019   Procedure: RIGHT SHOULDER ARTHROSCOP, MINI OPEN ROTATOR CUFF TEAR REPAIR,  BICEPS TENODESIS, DISTAL CLAVICLE EXCISION;  Surgeon: Cammy Copa, MD;  Location: [facility] OR;  Service: Orthopedics;  Laterality: Right;   SPINE SURGERY     TOOTH EXTRACTION N/A 02/22/2021   Procedure: DENTAL RESTORATION/EXTRACTIONS;  Surgeon: Ocie Doyne, DMD;  Location: [facility] OR;  Service: Oral Surgery;  Laterality: N/A;   TRACHEOSTOMY  02/2017   TUBAL LIGATION     VESICOVAGINAL FISTULA CLOSURE W/ TAH  2009    Family History  Problem Relation Age of Onset   Heart attack Mother    Stroke Mother    Diabetes Mother    Hypertension Mother    Arthritis Mother    Stroke Father    Asthma Sister    Hypertension Sister    Asthma Sister    Diabetes Sister    Asthma Brother    Seizures Brother    Asthma Brother    Diabetes Brother    Asthma Daughter    Asthma Son    Asthma Grandson    Colon cancer Neg Hx    Rectal cancer Neg Hx    Stomach cancer Neg Hx     Esophageal cancer Neg Hx    Ovarian cancer Neg Hx    Pancreatic cancer Neg Hx     Social History   Socioeconomic History   Marital status: Married    Spouse name: Hydrologist   Number of children: Not on file   Years of education: Not on file   Highest education level: Not on file  Occupational History   Not on file  Tobacco Use   Smoking status: Former    Current packs/day: 0.00    Average packs/day: 0.5 packs/day for 25.0 years (12.5 ttl pk-yrs)    Types: Cigarettes    Start date: 01/21/1991    Quit date: 01/21/2016  Years since quitting: 7.2   Smokeless tobacco: Never  Vaping Use   Vaping status: Never Used  Substance and Sexual Activity   Alcohol use: Yes    Alcohol/week: 1.0 standard drink of alcohol    Types: 1 Glasses of wine per week    Comment: social wine   Drug use: Never    Types: Marijuana    Comment: couple times a month, Last use was on   Sexual activity: Yes    Partners: Male    Birth control/protection: Surgical    Comment: Hysterectomy  Other Topics Concern   Not on file  Social History Narrative   Lives with husband   Social Determinants of Health   Financial Resource Strain: Medium Risk (06/06/2021)   Overall Financial Resource Strain (CARDIA)    Difficulty of Paying Living Expenses: Somewhat hard  Food Insecurity: No Food Insecurity (02/18/2023)   Hunger Vital Sign    Worried About Running Out of Food in the Last Year: Never true    Ran Out of Food in the Last Year: Never true  Transportation Needs: No Transportation Needs (02/18/2023)   PRAPARE - Administrator, Civil Service (Medical): No    Lack of Transportation (Non-Medical): No  Physical Activity: Inactive (02/18/2023)   Exercise Vital Sign    Days of Exercise per Week: 0 days    Minutes of Exercise per Session: 0 min  Stress: Stress Concern Present (02/18/2023)   [employer] of Occupational Health - Occupational Stress Questionnaire    Feeling of Stress : Rather much   Social Connections: Moderately Isolated (02/18/2023)   Social Connection and Isolation Panel [NHANES]    Frequency of Communication with Friends and Family: More than three times a week    Frequency of Social Gatherings with Friends and Family: More than three times a week    Attends Religious Services: Never    Database administrator or Organizations: No    Attends Banker Meetings: Never    Marital Status: Married    Allergies  Allergen Reactions   Reglan [Metoclopramide] Other (See Comments)    Panic attack    Outpatient Medications Prior to Visit  Medication Sig Dispense Refill   acetaminophen (TYLENOL) 500 MG tablet Take 1,000 mg by mouth 2 (two) times daily as needed for moderate pain, fever or headache.     albuterol (PROVENTIL) (2.5 MG/3ML) 0.083% nebulizer solution Take 3 mLs (2.5 mg total) by nebulization every 6 (six) hours as needed for wheezing or shortness of breath. 75 mL 12   albuterol (VENTOLIN HFA) 108 (90 Base) MCG/ACT inhaler INHALE 2 PUFFS BY MOUTH EVERY 6 HOURS AS NEEDED FOR WHEEZING FOR SHORTNESS OF BREATH (Patient taking differently: Inhale 2 puffs into the lungs every 6 (six) hours as needed for shortness of breath or wheezing.) 18 g 0   amitriptyline (ELAVIL) 10 MG tablet Take 1 tablet (10 mg total) by mouth at bedtime. 90 tablet 0   amLODipine (NORVASC) 5 MG tablet Take 1 tablet (5 mg total) by mouth daily. 180 tablet 3   Blood Glucose Monitoring Suppl (ONETOUCH VERIO FLEX SYSTEM) w/Device KIT Use to check glucose levels daily; E11.9 1 kit 0   buPROPion (WELLBUTRIN SR) 150 MG 12 hr tablet Take 1 tablet (150 mg total) by mouth 2 (two) times daily. 180 tablet 1   cetirizine (ZYRTEC) 10 MG tablet TAKE 1 TABLET BY MOUTH AT BEDTIME 30 tablet 0   Continuous Blood Gluc Receiver (FREESTYLE LIBRE  2 READER) DEVI USE TO CHECK BLOOD SUGAR THREE TIMES DAILY. 1 each 0   Continuous Glucose Sensor (FREESTYLE LIBRE 2 SENSOR) MISC USE TO CHECK BLOOD GLUCOSE THREE  TIMES DAILY.  CHANGE SENSOR ONCE EVERY 14 DAYS. 2 each 1   dapagliflozin propanediol (FARXIGA) 10 MG TABS tablet Take 1 tablet (10 mg total) by mouth daily before breakfast. 90 tablet 1   furosemide (LASIX) 40 MG tablet Take 1 tablet (40 mg total) by mouth daily as needed for fluid or edema. 30 tablet 1   glucose blood (ONETOUCH VERIO) test strip Use to check glucose levels daily; E11.9 100 each 6   insulin degludec (TRESIBA FLEXTOUCH) 200 UNIT/ML FlexTouch Pen Inject 170 Units into the skin daily. 54 mL 3   insulin lispro (HUMALOG KWIKPEN) 100 UNIT/ML KwikPen Inject 10 Units into the skin 3 (three) times daily. (Patient taking differently: Inject 10 Units into the skin with breakfast, with lunch, and with evening meal.) 15 mL 6   Insulin Pen Needle 31G X 8 MM MISC Use to inject [place] and Humalog. Pt may use 4 pen needles daily. 100 each 11   lisinopril (ZESTRIL) 20 MG tablet Take 1 tablet (20 mg total) by mouth daily. 90 tablet 1   melatonin 3 MG TABS tablet Take 1 tablet (3 mg total) by mouth at bedtime. 30 tablet 3   Misc. Devices MISC Shower chair 1 each 0   mometasone-formoterol (DULERA) 200-5 MCG/ACT AERO Inhale 2 puffs into the lungs in the morning and at bedtime. 1 each 6   montelukast (SINGULAIR) 10 MG tablet Take 1 tablet (10 mg total) by mouth at bedtime. 90 tablet 1   ondansetron (ZOFRAN) 4 MG tablet TAKE 1 TABLET BY MOUTH EVERY 8 HOURS AS NEEDED FOR NAUSEA FOR VOMITING (Patient taking differently: Take 4 mg by mouth every 8 (eight) hours as needed for nausea or vomiting.) 30 tablet 0   OneTouch Delica Lancets 33G MISC Use to check glucose levels daily; E11.9 100 each 3   oxyCODONE-acetaminophen (PERCOCET) 10-325 MG tablet Take 1 tablet by mouth 4 (four) times daily as needed for pain.     pantoprazole (PROTONIX) 40 MG tablet Take 1 tablet (40 mg total) by mouth 2 (two) times daily. 180 tablet 1   pregabalin (LYRICA) 50 MG capsule Take 1 capsule (50 mg total) by mouth in the morning, at  noon, and at bedtime. 90 capsule 3   rosuvastatin (CRESTOR) 20 MG tablet Take 1 tablet (20 mg total) by mouth every evening.     tirzepatide (MOUNJARO) 7.5 MG/0.5ML Pen Inject 7.5 mg into the skin once a week. For 4 weeks then increase to 10 mg once a week (Patient taking differently: Inject 7.5 mg into the skin every Sunday.) 6 mL 0   tiZANidine (ZANAFLEX) 4 MG tablet Take 1 tablet (4 mg total) by mouth every 8 (eight) hours as needed for muscle spasms. 90 tablet 3   Vitamin D, Ergocalciferol, (DRISDOL) 1.25 MG (50000 UNIT) CAPS capsule Take 1 capsule (50,000 Units total) by mouth every 7 (seven) days. 5 capsule 0   hydrOXYzine (ATARAX) 25 MG tablet Take 1 tablet (25 mg total) by mouth 3 (three) times daily as needed. for anxiety (Patient taking differently: Take 25 mg by mouth 3 (three) times daily as needed for anxiety.) 180 tablet 1   Misc. Devices MISC Nebulizer device. Diagnosis - Asthma 1 each 0   sertraline (ZOLOFT) 100 MG tablet Take 1 tablet (100 mg total) by mouth at  bedtime. (Patient taking differently: Take 100 mg by mouth daily.) 90 tablet 1   No facility-administered medications prior to visit.     ROS Review of Systems  Constitutional:  Negative for activity change and appetite change.  HENT:  Negative for sinus pressure and sore throat.   Respiratory:  Negative for chest tightness, shortness of breath and wheezing.   Cardiovascular:  Negative for chest pain and palpitations.  Gastrointestinal:  Negative for abdominal distention, abdominal pain and constipation.  Genitourinary: Negative.   Musculoskeletal: Negative.   Neurological:  Positive for headaches.  Psychiatric/Behavioral:  Negative for behavioral problems and dysphoric mood.     Objective:  BP (!) 141/93   Pulse (!) 108   Ht 5\' 1"  (1.549 m)   Wt 259 lb 6.4 oz (117.7 kg)   LMP  (LMP Unknown)   SpO2 94%   BMI 49.01 kg/m      04/06/2023   11:05 AM 03/13/2023    8:05 AM 03/13/2023    5:00 AM  BP/Weight   Systolic BP 141 133   Diastolic BP 93 84   Wt. (Lbs) 259.4  286.6  BMI 49.01 kg/m2  54.15 kg/m2      Physical Exam Constitutional:      Appearance: She is well-developed.  Neck:     Comments: Tracheostomy tube in place Cardiovascular:     Rate and Rhythm: Tachycardia present.     Heart sounds: Normal heart sounds. No murmur heard. Pulmonary:     Effort: Pulmonary effort is normal.     Breath sounds: Normal breath sounds. No wheezing or rales.  Chest:     Chest wall: No tenderness.  Abdominal:     General: Bowel sounds are normal. There is no distension.     Palpations: Abdomen is soft. There is no mass.     Tenderness: There is no abdominal tenderness.  Musculoskeletal:        General: Normal range of motion.     Right lower leg: No edema.     Left lower leg: No edema.  Neurological:     Mental Status: She is alert and oriented to person, place, and time.  Psychiatric:        Mood and Affect: Mood normal.        Latest Ref Rng & Units 03/13/2023    8:58 AM 03/12/2023   12:59 AM 03/11/2023    9:35 PM  CMP  Glucose 70 - 99 mg/dL 409  811  914   BUN 6 - 20 mg/dL 14  25  28    Creatinine 0.44 - 1.00 mg/dL 7.82  9.56  2.13   Sodium 135 - 145 mmol/L 136  138  138   Potassium 3.5 - 5.1 mmol/L 4.8  5.2  5.5   Chloride 98 - 111 mmol/L 106  107  106   CO2 22 - 32 mmol/L 21  23  25    Calcium 8.9 - 10.3 mg/dL 8.7  9.1  9.3   Total Protein 6.5 - 8.1 g/dL  6.4  6.9   Total Bilirubin 0.3 - 1.2 mg/dL  0.3  0.4   Alkaline Phos 38 - 126 U/L  91  97   AST 15 - 41 U/L  33  36   ALT 0 - 44 U/L  25  26     Lipid Panel     Component Value Date/Time   CHOL 144 10/09/2022 1046   TRIG 124 10/09/2022 1046   HDL 54 10/09/2022  1046   CHOLHDL 3.2 06/15/2018 1031   CHOLHDL 3.9 02/22/2016 0954   VLDL 26 02/22/2016 0954   LDLCALC 68 10/09/2022 1046    CBC    Component Value Date/Time   WBC 7.7 03/12/2023 0059   RBC 3.59 (L) 03/12/2023 0059   HGB 10.2 (L) 03/12/2023 0059   HGB  11.9 04/12/2018 1035   HCT 31.4 (L) 03/12/2023 0059   HCT 34.8 04/12/2018 1035   PLT 249 03/12/2023 0059   PLT 373 04/12/2018 1035   MCV 87.5 03/12/2023 0059   MCV 84 04/12/2018 1035   MCH 28.4 03/12/2023 0059   MCHC 32.5 03/12/2023 0059   RDW 14.6 03/12/2023 0059   RDW 14.6 04/12/2018 1035   LYMPHSABS 3.7 03/11/2023 0040   LYMPHSABS 4.0 (H) 04/12/2018 1035   MONOABS 0.7 03/11/2023 0040   EOSABS 0.1 03/11/2023 0040   EOSABS 0.0 04/12/2018 1035   BASOSABS 0.0 03/11/2023 0040   BASOSABS 0.0 04/12/2018 1035    Lab Results  Component Value Date   HGBA1C 10.3 (H) 03/11/2023    Assessment & Plan:      Syncope: Recent episode of syncope with fall and subsequent rhabdomyolysis, AKI affecting kidneys. No clear precipitating factors identified. Dizziness reported both while sitting and standing. No clear postural component or vertigo CT head and MRI unremarkable -Refer to neurology for further evaluation of syncope and dizziness ?possibly related to migraines   Migraine: Chronic migraines, currently on Amitriptyline for prophylaxis, Imitrex for abortive therapy. Recent new headache localized to one side of the face, different from usual migraines. -Refer to neurology for further evaluation of new headache and management of chronic migraines. -Consider changing Amitriptyline due to side effects and potential contribution to dizziness and weight gain  Chronic Pain: Chronic widespread pain, worse with activity. Currently managed with Oxycodone and Tizanidine. -Refer to aquatic therapy for additional pain management strategy.  Anxiety and Depression: Chronic anxiety and depression, currently managed with Zoloft and Hydroxyzine. Recent increase in stressors. -Increase Zoloft dose and discontinue Hydroxyzine to reduce sedating medications.  Chronic pain: -Continue Oxycodone and Tizanidine for pain management.  Insomnia: Chronic insomnia, difficulty maintaining sleep. History of sleep  apnea but she has no CPAP machine.  It appears she was previously unable to tolerate CPAP machine -Refer to pulmonology for re-evaluation of sleep apnea and consideration of alternative management strategies.  Knee Pain: Chronic knee pain, no recent orthopedic evaluation. -Consider referral to orthopedics if pain persists or worsens after initiation of aquatic therapy.  General Health Maintenance -Order nebulizer and supplies for home use. -Check kidney function today. -Follow-up appointment scheduled for 04/21/2023 for chronic disease management        Meds ordered this encounter  Medications   sertraline (ZOLOFT) 100 MG tablet    Sig: Take 2 tablets (200 mg total) by mouth at bedtime.    Dispense:  90 tablet    Refill:  1    Dose increase; discontinue hydroxyzine.   Misc. Devices MISC    Sig: Nebulizer device. Diagnosis - Asthma    Dispense:  1 each    Refill:  0    Follow-up: Return for previously scheduled appointment.       Hoy Register, MD, FAAFP. [facility] [place], [place] [phone]   04/06/2023, 1:28 PM

## 2023-04-06 NOTE — Patient Instructions (Signed)
Rhabdomyolysis Rhabdomyolysis is a condition that happens when muscle cells break down and release substances into the blood that can damage the kidneys. This condition happens because of damage to the muscles that move bones (skeletal muscles). When the skeletal muscles are damaged, substances inside the muscle cells go into the blood. One of these substances is a protein called myoglobin. Large amounts of myoglobin can cause kidney damage or kidney failure. Other substances that are released by muscle cells may upset the balance of the minerals (electrolytes) in the blood. This imbalance causes the blood to have too much acid (acidosis). What are the causes? This condition is caused by muscle damage. There are common causes and possible causes. Common causes of the condition Common causes of muscle damage include: Using the muscles too much. Getting an injury that crushes or squeezes a muscle too tightly. Using drugs, mainly cocaine. Drinking too much alcohol. Possible causes of the condition Other possible causes of this condition include: Some prescription medicines, such as: Medicines that lower cholesterol (statins). Amphetamines, which are used to treat attention deficit hyperactivity disorder (ADHD) or to help with weight loss. Certain pain medicines (opiates). Infections. Muscle diseases that are passed down from parent to child (inherited). High fever. Heatstroke. Dehydration. Seizures. Surgery. What increases the risk? The following factors may make you more likely to develop this condition: A family history of muscle disease. Taking part in extreme sports, such as running in marathons. Diabetes. Being an older adult. Heavy drug or alcohol use. What are the signs or symptoms? A person can have the symptoms of rhabdomyolysis or complications that come from the symptoms. Symptoms of the disease Symptoms of this condition vary. Some people have very few symptoms and other  people have many symptoms.  Common symptoms include: Muscle pain and swelling. Weak muscles. Dark urine. Feeling weak and tired. Other symptoms include: Nausea and vomiting. A fever. Pain in the abdomen. Pain in the joints. Complications from the disease Complications from this condition include: A heart rhythm that is not normal (arrhythmia). Seizures. Not urinating enough because of kidney failure. Very low blood pressure (hypotension) and shock. Signs of shock include dizziness, blurry vision, and clammy skin. Bleeding that is hard to stop or control. Compartment syndrome. This is when there is too much pressure in the space surrounding muscles. How is this diagnosed? This condition is diagnosed based on: Your symptoms and medical history. A physical exam. Tests, including: Blood tests. Urine tests to check for myoglobin. You may also have other tests to check for causes of muscle damage and to check for complications. How is this treated? This condition may be treated with: Fluids and medicines given through an IV that is inserted into one of your veins. Medicines to lower acidosis or to restore the balance of electrolytes in your blood. Hemodialysis. This treatment uses an artificial kidney machine to filter your blood while you recover. You may have this if other treatments are not helping. Follow these instructions at home: Activity Rest at home as told by your health care provider. Return to your normal activities as told by your health care provider. Ask your health care provider what activities are safe for you. Do not do activities that take a lot of effort. Ask your health care provider what level of exercise is safe for you. Alcohol use Do not drink alcohol if: Your health care provider tells you not to drink. You are pregnant, may be pregnant, or are planning to become pregnant. If you  drink alcohol: Limit how much you use to: 0-1 drink a day for women. 0-2  drinks a day for men. Be aware of how much alcohol is in your drink. In the [place] one drink equals one 12 oz bottle of beer (355 mL), one 5 oz glass of wine (148 mL), or one 1 oz glass of hard liquor (44 mL). General instructions  Take over-the-counter and prescription medicines only as told by your health care provider. Drink enough fluid to keep your urine pale yellow. Do not use drugs. If you are having problems with drug or alcohol use, ask your health care provider for help. Keep all follow-up visits as told by your health care provider. This is important. Contact a health care provider if: You start having symptoms of this condition after treatment. Get help right away if you: Have a seizure. Bleed easily or cannot control bleeding. Cannot urinate. Have chest pain. Have trouble breathing. These symptoms may represent a serious problem that is an emergency. Do not wait to see if the symptoms will go away. Get medical help right away. Call your local emergency services (911 in the [place] Do not drive yourself to the hospital.  Summary Rhabdomyolysis is a condition that happens when muscle cells break down and release substances into the blood that can damage the kidneys. This condition happens because of damage to the muscles that move bones (skeletal muscle). It can also be caused by too much alcohol and drug use. Treatment may include fluids, medicines, and helping the kidneys filter blood (hemodialysis). Contact your health care provider if you start having symptoms of this condition after treatment. Get help right away if you have a seizure, chest pain, or trouble breathing, or if you bleed easily, cannot control bleeding, or cannot urinate. This information is not intended to replace advice given to you by your health care provider. Make sure you discuss any questions you have with your health care provider. Document Revised: 09/01/2022 Document Reviewed: 09/01/2022 Elsevier  Patient Education  2024 [employer].

## 2023-04-06 NOTE — Telephone Encounter (Signed)
Requested medication (s) are due for refill today: No  Requested medication (s) are on the active medication list: No  Last refill:  01/21/23  Future visit scheduled: Yes  Notes to clinic:  Unable to refill per protocol, medication not assigned to the refill protocol.      Requested Prescriptions  Pending Prescriptions Disp Refills   fluconazole (DIFLUCAN) 150 MG tablet [Pharmacy Med Name: Fluconazole 150 MG Oral Tablet] 1 tablet 0    Sig: TAKE ONE TABLET BY MOUTH AS A ONE-TIME DOSE     Off-Protocol Failed - 04/06/2023  4:08 AM      Failed - Medication not assigned to a protocol, review manually.      Passed - Valid encounter within last 12 months    Recent Outpatient Visits           Today Dizziness   [place] [facility] [place]ley, [place], MD   2 months ago Type 1 diabetes mellitus without complication [facility])   [place] [facility] [place], [place], MD   5 months ago Type 1 diabetes mellitus with other specified complication [facility])   [place] [facility] [place], [place], MD   9 months ago Type 1 diabetes mellitus with other specified complication [facility])   [place] [facility] [place], [place], MD   12 months ago Type 1 diabetes mellitus with other specified complication [facility])   [place] [facility] Hoy Register, MD       Future Appointments             In 4 days Juanda Chance, NP [facility]   In 2 weeks Hoy Register, MD [facility]   In 2 months Shari Prows, Kathlynn Grate, MD [facility] at [facility]            Signed Prescriptions Disp Refills   Continuous Glucose Sensor (FREESTYLE LIBRE 2 SENSOR) MISC 6 each 0    Sig: USE TO CHECK BLOOD GLUCOSE THREE TIMES DAILY. CHANGE SENSOR ONCE  EVERY 14 DAYS     Endocrinology: Diabetes - Testing Supplies Passed - 04/06/2023   4:08 AM      Passed - Valid encounter within last 12 months    Recent Outpatient Visits           Today Dizziness   [place] [facility], Odette Horns, MD   2 months ago Type 1 diabetes mellitus without complication [facility])   [place] [facility] [place], [place], MD   5 months ago Type 1 diabetes mellitus with other specified complication [facility])   [place] [facility] [place], [place], MD   9 months ago Type 1 diabetes mellitus with other specified complication [facility])   [place] [facility] [place], Odette Horns, MD   12 months ago Type 1 diabetes mellitus with other specified complication [facility])   [place] [facility] Hoy Register, MD       Future Appointments             In 4 days Juanda Chance, NP [facility] Physiatry   In 2 weeks Hoy Register, MD [facility]   In 2 months Shari Prows, Kathlynn Grate, MD [facility] at [facility]

## 2023-04-06 NOTE — Telephone Encounter (Signed)
Spoke to patient and she stated that she would like her senor sent to [facility], she stated she did not mean to have it sent to [facility]. Patient did not want to be triaged for the fluconazole Rx she stated that Dr. Alvis Lemmings knows what is going on she was seen in office today.

## 2023-04-07 LAB — CMP14+EGFR
ALT: 17 IU/L (ref 0–32)
AST: 15 IU/L (ref 0–40)
Albumin: 4.5 g/dL (ref 3.8–4.9)
Alkaline Phosphatase: 143 IU/L — ABNORMAL HIGH (ref 44–121)
BUN/Creatinine Ratio: 11 (ref 9–23)
BUN: 15 mg/dL (ref 6–24)
Bilirubin Total: <0.2 mg/dL (ref 0.0–1.2)
CO2: 21 mmol/L (ref 20–29)
Calcium: 9.8 mg/dL (ref 8.7–10.2)
Chloride: 102 mmol/L (ref 96–106)
Creatinine, Ser: 1.34 mg/dL — ABNORMAL HIGH (ref 0.57–1.00)
Globulin, Total: 3.3 g/dL (ref 1.5–4.5)
Glucose: 142 mg/dL — ABNORMAL HIGH (ref 70–99)
Potassium: 4.4 mmol/L (ref 3.5–5.2)
Sodium: 142 mmol/L (ref 134–144)
Total Protein: 7.8 g/dL (ref 6.0–8.5)
eGFR: 47 mL/min/{1.73_m2} — ABNORMAL LOW (ref >59)

## 2023-04-07 LAB — CK: Total CK: 282 U/L — ABNORMAL HIGH (ref 32–182)

## 2023-04-08 DIAGNOSIS — G4733 Obstructive sleep apnea (adult) (pediatric): Secondary | ICD-10-CM | POA: Diagnosis not present

## 2023-04-08 DIAGNOSIS — Z930 Tracheostomy status: Secondary | ICD-10-CM | POA: Diagnosis not present

## 2023-04-08 DIAGNOSIS — J45909 Unspecified asthma, uncomplicated: Secondary | ICD-10-CM | POA: Diagnosis not present

## 2023-04-08 DIAGNOSIS — J449 Chronic obstructive pulmonary disease, unspecified: Secondary | ICD-10-CM | POA: Diagnosis not present

## 2023-04-08 DIAGNOSIS — J386 Stenosis of larynx: Secondary | ICD-10-CM | POA: Diagnosis not present

## 2023-04-08 DIAGNOSIS — J0410 Acute tracheitis without obstruction: Secondary | ICD-10-CM | POA: Diagnosis not present

## 2023-04-08 DIAGNOSIS — J3802 Paralysis of vocal cords and larynx, bilateral: Secondary | ICD-10-CM | POA: Diagnosis not present

## 2023-04-10 ENCOUNTER — Ambulatory Visit (INDEPENDENT_AMBULATORY_CARE_PROVIDER_SITE_OTHER): Payer: 59 | Admitting: Physical Medicine and Rehabilitation

## 2023-04-10 ENCOUNTER — Encounter: Payer: Self-pay | Admitting: Physical Medicine and Rehabilitation

## 2023-04-10 DIAGNOSIS — J4489 Other specified chronic obstructive pulmonary disease: Secondary | ICD-10-CM | POA: Diagnosis not present

## 2023-04-10 DIAGNOSIS — I5032 Chronic diastolic (congestive) heart failure: Secondary | ICD-10-CM | POA: Diagnosis not present

## 2023-04-10 DIAGNOSIS — M5416 Radiculopathy, lumbar region: Secondary | ICD-10-CM

## 2023-04-10 DIAGNOSIS — M5136 Other intervertebral disc degeneration, lumbar region: Secondary | ICD-10-CM | POA: Diagnosis not present

## 2023-04-10 DIAGNOSIS — G894 Chronic pain syndrome: Secondary | ICD-10-CM | POA: Diagnosis not present

## 2023-04-10 DIAGNOSIS — M4316 Spondylolisthesis, lumbar region: Secondary | ICD-10-CM | POA: Diagnosis not present

## 2023-04-10 DIAGNOSIS — M1990 Unspecified osteoarthritis, unspecified site: Secondary | ICD-10-CM | POA: Diagnosis not present

## 2023-04-10 DIAGNOSIS — E785 Hyperlipidemia, unspecified: Secondary | ICD-10-CM | POA: Diagnosis not present

## 2023-04-10 DIAGNOSIS — I130 Hypertensive heart and chronic kidney disease with heart failure and stage 1 through stage 4 chronic kidney disease, or unspecified chronic kidney disease: Secondary | ICD-10-CM | POA: Diagnosis not present

## 2023-04-10 DIAGNOSIS — M961 Postlaminectomy syndrome, not elsewhere classified: Secondary | ICD-10-CM | POA: Diagnosis not present

## 2023-04-10 DIAGNOSIS — E1122 Type 2 diabetes mellitus with diabetic chronic kidney disease: Secondary | ICD-10-CM | POA: Diagnosis not present

## 2023-04-10 DIAGNOSIS — T796XXD Traumatic ischemia of muscle, subsequent encounter: Secondary | ICD-10-CM | POA: Diagnosis not present

## 2023-04-10 DIAGNOSIS — K2990 Gastroduodenitis, unspecified, without bleeding: Secondary | ICD-10-CM | POA: Diagnosis not present

## 2023-04-10 DIAGNOSIS — G4733 Obstructive sleep apnea (adult) (pediatric): Secondary | ICD-10-CM | POA: Diagnosis not present

## 2023-04-10 DIAGNOSIS — E1165 Type 2 diabetes mellitus with hyperglycemia: Secondary | ICD-10-CM | POA: Diagnosis not present

## 2023-04-10 DIAGNOSIS — G5600 Carpal tunnel syndrome, unspecified upper limb: Secondary | ICD-10-CM | POA: Diagnosis not present

## 2023-04-10 DIAGNOSIS — J9611 Chronic respiratory failure with hypoxia: Secondary | ICD-10-CM | POA: Diagnosis not present

## 2023-04-10 DIAGNOSIS — N1831 Chronic kidney disease, stage 3a: Secondary | ICD-10-CM | POA: Diagnosis not present

## 2023-04-10 DIAGNOSIS — J4540 Moderate persistent asthma, uncomplicated: Secondary | ICD-10-CM | POA: Diagnosis not present

## 2023-04-10 DIAGNOSIS — E1140 Type 2 diabetes mellitus with diabetic neuropathy, unspecified: Secondary | ICD-10-CM | POA: Diagnosis not present

## 2023-04-10 DIAGNOSIS — K219 Gastro-esophageal reflux disease without esophagitis: Secondary | ICD-10-CM | POA: Diagnosis not present

## 2023-04-10 DIAGNOSIS — K2970 Gastritis, unspecified, without bleeding: Secondary | ICD-10-CM | POA: Diagnosis not present

## 2023-04-10 DIAGNOSIS — D631 Anemia in chronic kidney disease: Secondary | ICD-10-CM | POA: Diagnosis not present

## 2023-04-10 DIAGNOSIS — J9602 Acute respiratory failure with hypercapnia: Secondary | ICD-10-CM | POA: Diagnosis not present

## 2023-04-10 DIAGNOSIS — G43909 Migraine, unspecified, not intractable, without status migrainosus: Secondary | ICD-10-CM | POA: Diagnosis not present

## 2023-04-10 NOTE — Progress Notes (Signed)
[name] - 52 y.o. female MRN [id number]  Date of birth: Jan 07, 1971  Office Visit Note: Visit Date: 04/10/2023 PCP: Hoy Register, MD Referred by: Hoy Register, MD  Subjective: Chief Complaint  Patient presents with   Lower Back - Pain   HPI: [name] is a 52 y.o. female who comes in today per the request of Dr. Dorene Grebe for evaluation of chronic, worsening and severe bilateral lower back pain radiating to buttocks, down to both legs to feet, left greater than right. Also reports numbness and tingling to bilateral legs. Prior patient of Dr. Vira Browns. Pain ongoing for several years, worsens with movement and activity. She describes pain as throbbing and sore sensation, currently rates as 10 out of 10. Some relief of pain with home exercise regimen, rest and use of medications. She is currently undergoing in home physical therapy. Patient is currently undergoing chronic pain management with Carmel [place], NP at [facility], she is prescribed 120 tablets of 10/325 mg Percocet monthly. Recent lumbar MRI imaging exhibits posterior lumbar interbody fusion at L4-L5 without foraminal or central canal stenosis. No high grade spinal canal stenosis noted. Prior right L4-L5 transforaminal interbody fusion in 2022 by Dr. Otelia Sergeant. Patient has undergone multiple injections in our office over the years including both facet joint injections and lumbar epidural steroid injections. She does not feel these injections were beneficial in alleviating her pain. Patient currently ambulates with cane. Patient denies focal weakness. No recent trauma or falls.   Patient does have significant medical history including asthma, tracheostomy dependent, diabetes mellitus, morbid obesity, chronic pain syndrome, anxiety and depression.        Review of Systems  Musculoskeletal:  Positive for back pain and myalgias.  Neurological:  Positive for tingling. Negative for  focal weakness and weakness.  All other systems reviewed and are negative.  Otherwise per HPI.  Assessment & Plan: Visit Diagnoses:    ICD-10-CM   1. Lumbar radiculopathy  M54.16     2. Chronic pain syndrome  G89.4     3. Post laminectomy syndrome  M96.1        Plan: Findings:  Chronic, worsening and severe bilateral lower back pain radiating to buttocks and down both legs to feet. Patient continues to have severe pain despite good conservative therapies such as formal physical therapy, home exercise regimen, rest and use of medications. Patients clinical presentation and exam do not directly correlate with recent lumbar MRI imaging. Recent imaging looks good, L4-L5 fusion intact, no residual stenosis noted. Prior lumbar epidural steroid injections were not beneficial in alleviating her pain. I discussed treatment plan with her in detail today, we do not recommend continuing with interventional spine treatments at this time. Best option for her is to continue with chronic pain management and physical therapy. She is scheduled to start water therapy in the coming weeks. Patient is agreeable with plan, she has no questions at this time. No red flag symptoms noted upon exam today.        Meds & Orders: No orders of the defined types were placed in this encounter.  No orders of the defined types were placed in this encounter.   Follow-up: Return if symptoms worsen or fail to improve.   Procedures: No procedures performed      Clinical History: MRI LUMBAR SPINE WITHOUT CONTRAST   TECHNIQUE: Multiplanar, multisequence MR imaging of the lumbar spine was performed. No intravenous contrast was administered.   COMPARISON:  None Available.  FINDINGS: Segmentation:  Standard.   Alignment:  Physiologic.   Vertebrae: No acute fracture, evidence of discitis, or aggressive bone lesion.   Conus medullaris and cauda equina: Conus extends to the L1 level. Conus and cauda equina appear  normal.   Paraspinal and other soft tissues: No acute paraspinal abnormality.   Disc levels:   Disc spaces: Posterior lumbar interbody fusion at L4-5. Remainder the disc spaces are maintained. Postsurgical changes in the posterior paraspinal soft tissues at L4-5.   T12-L1: No significant disc bulge. No neural foraminal stenosis. No central canal stenosis.   L1-L2: No significant disc bulge. No neural foraminal stenosis. No central canal stenosis.   L2-L3: No significant disc bulge. No neural foraminal stenosis. No central canal stenosis.   L3-L4: No significant disc bulge. No neural foraminal stenosis. No central canal stenosis.   L4-L5: Interbody fusion.  No foraminal or central canal stenosis.   L5-S1: Minimal broad-based disc bulge. Mild bilateral facet arthropathy. No foraminal or central canal stenosis.   IMPRESSION: 1. Posterior lumbar interbody fusion at L4-5 without foraminal or central canal stenosis. 2. At L5-S1 there is a minimal broad-based disc bulge. Mild bilateral facet arthropathy. 3. No acute osseous injury of the lumbar spine.     Electronically Signed   By: Elige Ko M.D.   On: 03/25/2023 10:50   She reports that she quit smoking about 7 years ago. Her smoking use included cigarettes. She started smoking about 32 years ago. She has a 12.5 pack-year smoking history. She has never used smokeless tobacco.  Recent Labs    10/09/22 0955 01/15/23 1135 03/11/23 0040  HGBA1C 8.1* 11.2* 10.3*    Objective:  VS:  HT:    WT:   BMI:     BP:   HR: bpm  TEMP: ( )  RESP:  Physical Exam Vitals and nursing note reviewed.  HENT:     Head: Normocephalic and atraumatic.     Right Ear: External ear normal.     Left Ear: External ear normal.     Nose: Nose normal.     Mouth/Throat:     Mouth: Mucous membranes are moist.  Eyes:     Extraocular Movements: Extraocular movements intact.  Cardiovascular:     Rate and Rhythm: Normal rate.     Pulses:  Normal pulses.  Pulmonary:     Effort: Pulmonary effort is normal.  Abdominal:     General: Abdomen is flat. There is no distension.  Musculoskeletal:        General: Tenderness present.     Cervical back: Normal range of motion.     Comments: Patient rises from seated position to standing without difficulty. Good lumbar range of motion. No pain noted with facet loading. 5/5 strength noted with bilateral hip flexion, knee flexion/extension, ankle dorsiflexion/plantarflexion and EHL. No clonus noted bilaterally. No pain upon palpation of greater trochanters. No pain with internal/external rotation of bilateral hips. Sensation intact bilaterally. Negative slump test bilaterally. Ambulates without aid, gait steady.     Skin:    General: Skin is warm and dry.     Capillary Refill: Capillary refill takes less than 2 seconds.  Neurological:     Mental Status: She is alert and oriented to person, place, and time.     Gait: Gait abnormal.     Ortho Exam  Imaging: No results found.  Past Medical/Family/Surgical/Social History: Medications & Allergies reviewed per EMR, new medications updated. Patient Active Problem List   Diagnosis Date  Noted   Right sided weakness 03/11/2023   Syncope 03/11/2023   History of COPD 03/11/2023   DM2 (diabetes mellitus, type 2) (HCC) 03/11/2023   Anemia of chronic disease 03/11/2023   Severe persistent asthma without complication 01/15/2023   Moderate episode of recurrent major depressive disorder (HCC) 01/15/2023   Gastritis and gastroduodenitis    Fusion of spine of lumbar region 08/20/2021   Spondylolisthesis, lumbar region 08/20/2021    Class: Chronic   Chronic respiratory failure with hypoxia (HCC) 06/20/2021   Recurrent vomiting 11/28/2020   Dyspnea 08/26/2020   Type 1 diabetes mellitus (HCC) 04/12/2019   Chronic right shoulder pain    Chronic bilateral low back pain without sciatica 10/05/2018   Dysphonia 10/05/2018   Rotator cuff arthropathy,  right 06/15/2018   AKI (acute kidney injury) (HCC) 03/06/2018   Carpal tunnel syndrome 01/18/2018   Normocytic anemia 05/06/2017   Tracheostomy dependent (HCC) 03/26/2017   Morbid obesity with BMI of 40.0-44.9, adult (HCC) 03/13/2017   Subglottic stenosis 03/06/2017   Asthma 03/06/2017   Chronic diastolic heart failure (HCC) 03/06/2017   Tracheal stenosis 03/04/2017   Generalized anxiety disorder    Hemoptysis    Chronic pain syndrome    GERD (gastroesophageal reflux disease) 02/22/2016   Depression 02/22/2016   Migraine 01/30/2015   Bulging lumbar disc 09/05/2013   Essential hypertension 09/05/2013   Allergic rhinitis 08/11/2012   Chronic cough 08/11/2012   OSA (obstructive sleep apnea) 12/04/2011   Past Medical History:  Diagnosis Date   Abnormal UGI series    Acute pain of right shoulder due to trauma 10/05/2018   Acute respiratory failure with hypoxia and hypercapnia (HCC) 10/23/2018   Allergy    Anemia    Anxiety    Arthritis    Asthma    Chest pain of uncertain etiology 03/22/2019   Chronic back pain    Chronic chest pain    resolved, no problems since 2019 per patient 07/27/19   Coffee ground vomiting 10/14/2021   COPD (chronic obstructive pulmonary disease) (HCC)    inhaler   Depression    DM (diabetes mellitus) (HCC)    INSULIN DEPENDENT - TYPE 1   Dyspnea    GERD (gastroesophageal reflux disease)    Headache(784.0)    History of hiatal hernia    Hyperlipidemia    no med, diet controlled   Hypertension    Hypokalemia    Klebsiella cystitis 03/14/2017   Last Assessment & Plan:   Formatting of this note might be different from the original.  - On Zosyn  - Urine culture: Klebsiella pneumoniae   Neuromuscular disorder (HCC)    Osteoarthritis    Oxygen deficiency    Pneumonia    Respiratory disease 05/2017   uses inhalers, neb tx prn, no oxygen   Rotator cuff tear 07/28/2019   Sleep apnea    does not use CPAP due to trach, uses 2L supplement oxygen at  night   Status post tracheostomy (HCC) 03/14/2017   Last Assessment & Plan:  Formatting of this note might be different from the original. - VERY CRITICAL AIRWAY WITH EXTREME PRECUATION - On tach - ENT following along (please see above for current trach management plan)   Tracheostomy in place [facility]) 02/2017   Family History  Problem Relation Age of Onset   Heart attack Mother    Stroke Mother    Diabetes Mother    Hypertension Mother    Arthritis Mother    Stroke Father  Asthma Sister    Hypertension Sister    Asthma Sister    Diabetes Sister    Asthma Brother    Seizures Brother    Asthma Brother    Diabetes Brother    Asthma Daughter    Asthma Son    Asthma Grandson    Colon cancer Neg Hx    Rectal cancer Neg Hx    Stomach cancer Neg Hx    Esophageal cancer Neg Hx    Ovarian cancer Neg Hx    Pancreatic cancer Neg Hx    Past Surgical History:  Procedure Laterality Date   ABDOMINAL HYSTERECTOMY  2009   APPENDECTOMY     BIOPSY  10/15/2021   Procedure: BIOPSY;  Surgeon: Tressia Danas, MD;  [facility] END[facility]COPY;  Service: Gastroenterology;;   CESAREAN SECTION     x 3   CHOLECYSTECTOMY N/A 03/02/2014   Procedure: LAPAROSCOPIC CHOLECYSTECTOMY;  Surgeon: Clovis Pu. Cornett, MD;  Location: [facility] OR;  Service: General;  Laterality: N/A;   ESOPHAGOGASTRODUODENOSCOPY (EGD) WITH PROPOFOL N/A 10/15/2021   Procedure: ESOPHAGOGASTRODUODENOSCOPY (EGD) WITH PROPOFOL;  Surgeon: Tressia Danas, MD;  Location: [facility] ENDOSCOPY;  Service: Gastroenterology;  Laterality: N/A;   HERNIA REPAIR     PANENDOSCOPY N/A 03/04/2017   Procedure: PANENDOSCOPY WITH POSSIBLE FOREIGN BODY REMOVAL;  Surgeon: Flo Shanks, MD;  Location: [facility] OR;  Service: ENT;  Laterality: N/A;   ROTATOR CUFF REPAIR Left    ROTATOR CUFF REPAIR Right 07/27/2019   SHOULDER ARTHROSCOPY WITH SUBACROMIAL DECOMPRESSION, ROTATOR CUFF REPAIR AND BICEP TENDON REPAIR Right 07/28/2019   Procedure: RIGHT SHOULDER ARTHROSCOP, MINI  OPEN ROTATOR CUFF TEAR REPAIR,  BICEPS TENODESIS, DISTAL CLAVICLE EXCISION;  Surgeon: Cammy Copa, MD;  Location: [facility] OR;  Service: Orthopedics;  Laterality: Right;   SPINE SURGERY     TOOTH EXTRACTION N/A 02/22/2021   Procedure: DENTAL RESTORATION/EXTRACTIONS;  Surgeon: Ocie Doyne, DMD;  Location: [facility] OR;  Service: Oral Surgery;  Laterality: N/A;   TRACHEOSTOMY  02/2017   TUBAL LIGATION     VESICOVAGINAL FISTULA CLOSURE W/ TAH  2009   Social History   Occupational History   Not on file  Tobacco Use   Smoking status: Former    Current packs/day: 0.00    Average packs/day: 0.5 packs/day for 25.0 years (12.5 ttl pk-yrs)    Types: Cigarettes    Start date: 01/21/1991    Quit date: 01/21/2016    Years since quitting: 7.2   Smokeless tobacco: Never  Vaping Use   Vaping status: Never Used  Substance and Sexual Activity   Alcohol use: Yes    Alcohol/week: 1.0 standard drink of alcohol    Types: 1 Glasses of wine per week    Comment: social wine   Drug use: Never    Types: Marijuana    Comment: couple times a month, Last use was on   Sexual activity: Yes    Partners: Male    Birth control/protection: Surgical    Comment: Hysterectomy

## 2023-04-10 NOTE — Progress Notes (Signed)
Functional Pain Scale - descriptive words and definitions  Distressing (6)    Pain is present/unable to complete most ADLs limited by pain/sleep is difficult and active distraction is only marginal. Moderate range order  Average Pain 7  Lower back pain on both sides that radiates into the legs at times

## 2023-04-14 ENCOUNTER — Telehealth: Payer: Self-pay | Admitting: Family Medicine

## 2023-04-14 DIAGNOSIS — E1140 Type 2 diabetes mellitus with diabetic neuropathy, unspecified: Secondary | ICD-10-CM | POA: Diagnosis not present

## 2023-04-14 DIAGNOSIS — G4733 Obstructive sleep apnea (adult) (pediatric): Secondary | ICD-10-CM | POA: Diagnosis not present

## 2023-04-14 DIAGNOSIS — E1165 Type 2 diabetes mellitus with hyperglycemia: Secondary | ICD-10-CM | POA: Diagnosis not present

## 2023-04-14 DIAGNOSIS — E785 Hyperlipidemia, unspecified: Secondary | ICD-10-CM | POA: Diagnosis not present

## 2023-04-14 DIAGNOSIS — E1122 Type 2 diabetes mellitus with diabetic chronic kidney disease: Secondary | ICD-10-CM | POA: Diagnosis not present

## 2023-04-14 DIAGNOSIS — J9602 Acute respiratory failure with hypercapnia: Secondary | ICD-10-CM | POA: Diagnosis not present

## 2023-04-14 DIAGNOSIS — M4316 Spondylolisthesis, lumbar region: Secondary | ICD-10-CM | POA: Diagnosis not present

## 2023-04-14 DIAGNOSIS — T796XXD Traumatic ischemia of muscle, subsequent encounter: Secondary | ICD-10-CM | POA: Diagnosis not present

## 2023-04-14 DIAGNOSIS — M1990 Unspecified osteoarthritis, unspecified site: Secondary | ICD-10-CM | POA: Diagnosis not present

## 2023-04-14 DIAGNOSIS — I130 Hypertensive heart and chronic kidney disease with heart failure and stage 1 through stage 4 chronic kidney disease, or unspecified chronic kidney disease: Secondary | ICD-10-CM | POA: Diagnosis not present

## 2023-04-14 DIAGNOSIS — K219 Gastro-esophageal reflux disease without esophagitis: Secondary | ICD-10-CM | POA: Diagnosis not present

## 2023-04-14 DIAGNOSIS — G43909 Migraine, unspecified, not intractable, without status migrainosus: Secondary | ICD-10-CM | POA: Diagnosis not present

## 2023-04-14 DIAGNOSIS — I5032 Chronic diastolic (congestive) heart failure: Secondary | ICD-10-CM | POA: Diagnosis not present

## 2023-04-14 DIAGNOSIS — J9611 Chronic respiratory failure with hypoxia: Secondary | ICD-10-CM | POA: Diagnosis not present

## 2023-04-14 DIAGNOSIS — J4489 Other specified chronic obstructive pulmonary disease: Secondary | ICD-10-CM | POA: Diagnosis not present

## 2023-04-14 DIAGNOSIS — D631 Anemia in chronic kidney disease: Secondary | ICD-10-CM | POA: Diagnosis not present

## 2023-04-14 DIAGNOSIS — N1831 Chronic kidney disease, stage 3a: Secondary | ICD-10-CM | POA: Diagnosis not present

## 2023-04-14 DIAGNOSIS — K2990 Gastroduodenitis, unspecified, without bleeding: Secondary | ICD-10-CM | POA: Diagnosis not present

## 2023-04-14 DIAGNOSIS — K2970 Gastritis, unspecified, without bleeding: Secondary | ICD-10-CM | POA: Diagnosis not present

## 2023-04-14 DIAGNOSIS — M5136 Other intervertebral disc degeneration, lumbar region: Secondary | ICD-10-CM | POA: Diagnosis not present

## 2023-04-14 DIAGNOSIS — G894 Chronic pain syndrome: Secondary | ICD-10-CM | POA: Diagnosis not present

## 2023-04-14 DIAGNOSIS — G5600 Carpal tunnel syndrome, unspecified upper limb: Secondary | ICD-10-CM | POA: Diagnosis not present

## 2023-04-14 NOTE — Telephone Encounter (Signed)
Medication has been added and form has been re faxed.

## 2023-04-14 NOTE — Telephone Encounter (Signed)
[place] with [facility] states that they received orders for the pt nebulizer but it is not a full order. Per Fernande Bras it only has the frequency on the order and they are needing the medication as well.Fernande Bras states that they are needing a new order sent to them.   Phone number: [phone] Fax number: [phone]

## 2023-04-16 DIAGNOSIS — E1122 Type 2 diabetes mellitus with diabetic chronic kidney disease: Secondary | ICD-10-CM | POA: Diagnosis not present

## 2023-04-16 DIAGNOSIS — N1831 Chronic kidney disease, stage 3a: Secondary | ICD-10-CM | POA: Diagnosis not present

## 2023-04-16 DIAGNOSIS — N179 Acute kidney failure, unspecified: Secondary | ICD-10-CM | POA: Diagnosis not present

## 2023-04-16 DIAGNOSIS — J9611 Chronic respiratory failure with hypoxia: Secondary | ICD-10-CM | POA: Diagnosis not present

## 2023-04-16 DIAGNOSIS — I129 Hypertensive chronic kidney disease with stage 1 through stage 4 chronic kidney disease, or unspecified chronic kidney disease: Secondary | ICD-10-CM | POA: Diagnosis not present

## 2023-04-17 LAB — LAB REPORT - SCANNED: Creatinine, POC: 209.7 mg/dL

## 2023-04-21 ENCOUNTER — Ambulatory Visit: Payer: Self-pay

## 2023-04-21 ENCOUNTER — Ambulatory Visit: Payer: 59 | Admitting: Family Medicine

## 2023-04-21 DIAGNOSIS — K2990 Gastroduodenitis, unspecified, without bleeding: Secondary | ICD-10-CM | POA: Diagnosis not present

## 2023-04-21 DIAGNOSIS — G5600 Carpal tunnel syndrome, unspecified upper limb: Secondary | ICD-10-CM | POA: Diagnosis not present

## 2023-04-21 DIAGNOSIS — I130 Hypertensive heart and chronic kidney disease with heart failure and stage 1 through stage 4 chronic kidney disease, or unspecified chronic kidney disease: Secondary | ICD-10-CM | POA: Diagnosis not present

## 2023-04-21 DIAGNOSIS — E1122 Type 2 diabetes mellitus with diabetic chronic kidney disease: Secondary | ICD-10-CM | POA: Diagnosis not present

## 2023-04-21 DIAGNOSIS — N1831 Chronic kidney disease, stage 3a: Secondary | ICD-10-CM | POA: Diagnosis not present

## 2023-04-21 DIAGNOSIS — E1140 Type 2 diabetes mellitus with diabetic neuropathy, unspecified: Secondary | ICD-10-CM | POA: Diagnosis not present

## 2023-04-21 DIAGNOSIS — K219 Gastro-esophageal reflux disease without esophagitis: Secondary | ICD-10-CM | POA: Diagnosis not present

## 2023-04-21 DIAGNOSIS — G4733 Obstructive sleep apnea (adult) (pediatric): Secondary | ICD-10-CM | POA: Diagnosis not present

## 2023-04-21 DIAGNOSIS — G43909 Migraine, unspecified, not intractable, without status migrainosus: Secondary | ICD-10-CM | POA: Diagnosis not present

## 2023-04-21 DIAGNOSIS — T796XXD Traumatic ischemia of muscle, subsequent encounter: Secondary | ICD-10-CM | POA: Diagnosis not present

## 2023-04-21 DIAGNOSIS — E1165 Type 2 diabetes mellitus with hyperglycemia: Secondary | ICD-10-CM | POA: Diagnosis not present

## 2023-04-21 DIAGNOSIS — J9611 Chronic respiratory failure with hypoxia: Secondary | ICD-10-CM | POA: Diagnosis not present

## 2023-04-21 DIAGNOSIS — I5032 Chronic diastolic (congestive) heart failure: Secondary | ICD-10-CM | POA: Diagnosis not present

## 2023-04-21 DIAGNOSIS — J4489 Other specified chronic obstructive pulmonary disease: Secondary | ICD-10-CM | POA: Diagnosis not present

## 2023-04-21 DIAGNOSIS — M4316 Spondylolisthesis, lumbar region: Secondary | ICD-10-CM | POA: Diagnosis not present

## 2023-04-21 DIAGNOSIS — G894 Chronic pain syndrome: Secondary | ICD-10-CM | POA: Diagnosis not present

## 2023-04-21 DIAGNOSIS — J9602 Acute respiratory failure with hypercapnia: Secondary | ICD-10-CM | POA: Diagnosis not present

## 2023-04-21 DIAGNOSIS — M5136 Other intervertebral disc degeneration, lumbar region: Secondary | ICD-10-CM | POA: Diagnosis not present

## 2023-04-21 DIAGNOSIS — M1990 Unspecified osteoarthritis, unspecified site: Secondary | ICD-10-CM | POA: Diagnosis not present

## 2023-04-21 DIAGNOSIS — K2970 Gastritis, unspecified, without bleeding: Secondary | ICD-10-CM | POA: Diagnosis not present

## 2023-04-21 DIAGNOSIS — E785 Hyperlipidemia, unspecified: Secondary | ICD-10-CM | POA: Diagnosis not present

## 2023-04-21 DIAGNOSIS — D631 Anemia in chronic kidney disease: Secondary | ICD-10-CM | POA: Diagnosis not present

## 2023-04-21 NOTE — Telephone Encounter (Signed)
Chief Complaint: facial pain Symptoms: right sided face pain, back pain, elevated blood pressure Frequency: constant Pertinent Negatives: Patient denies SOB, Chest pain, nausea, vomiting Disposition: [] ED /[] Urgent Care (no appt availability in office) / [] Appointment(In office/virtual)/ []  [place] [facility]/ [] Home Care/ [x] Refused Recommended Disposition /[] [place] [facility]/ [x]  Follow-up with PCP Additional Notes: [facility] and nurse from [facility] on the line. Patient states she has facial pain due to a chipped wisdom tooth of the right side of the mouth. Patient reports pain 8/10 but decreases when taking tylenol. Patient also reports chronic back pain. Patient is scheduled for tooth extraction on 04/23/23 and ortho appointment for back and knee pain on 05/05/23. [facility] nurse reports blood pressure is 140/100 and pulse 114. No appointment available in office and patient declined urgent care and mobile clinic care at this time. Patient requested message be forwarded to provider for additional recommendations. Advised if symptoms get worse patient should go to UC or ED. Patient verbalized understanding  Reason for Disposition  [1] MODERATE pain (e.g., interferes with normal activities) AND [2] constant AND [3] present > 24 hours  Answer Assessment - Initial Assessment Questions 1. ONSET: "When did the pain start?" (e.g., minutes, hours, days)     It's been awhile because of my tooth 2. ONSET: "Does the pain come and go, or has it been constant since it started?" (e.g., constant, intermittent, fleeting)     Constant  3. SEVERITY: "How bad is the pain?"   (Scale 1-10; mild, moderate or severe)   - MILD (1-3): doesn't interfere with normal activities    - MODERATE (4-7): interferes with normal activities or awakens from sleep    - SEVERE (8-10): excruciating pain, unable to do any normal activities      8/10 4. LOCATION: "Where does it hurt?"      Right side of face  5.  RASH: "Is there any redness, rash, or swelling of the face?"     no 6. FEVER: "Do you have a fever?" If Yes, ask: "What is it, how was it measured, and when did it start?"      No 7. OTHER SYMPTOMS: "Do you have any other symptoms?" (e.g., fever, toothache, nasal discharge, nasal congestion, clicking sensation in jaw joint)     Chipped wisdom tooth, poor appetite, back pain  Protocols used: Face Pain-A-AH

## 2023-04-23 NOTE — Telephone Encounter (Signed)
Noted  

## 2023-04-24 ENCOUNTER — Other Ambulatory Visit: Payer: Self-pay | Admitting: Family Medicine

## 2023-04-24 ENCOUNTER — Other Ambulatory Visit: Payer: Self-pay | Admitting: Orthopaedic Surgery

## 2023-04-24 DIAGNOSIS — Z760 Encounter for issue of repeat prescription: Secondary | ICD-10-CM | POA: Diagnosis not present

## 2023-04-24 DIAGNOSIS — Z79899 Other long term (current) drug therapy: Secondary | ICD-10-CM | POA: Diagnosis not present

## 2023-04-24 DIAGNOSIS — M5416 Radiculopathy, lumbar region: Secondary | ICD-10-CM | POA: Diagnosis not present

## 2023-04-27 ENCOUNTER — Telehealth: Payer: Self-pay | Admitting: Family Medicine

## 2023-04-27 NOTE — Telephone Encounter (Signed)
Requested medications are due for refill today.  Tizanidine -  no, Statin - unsure  Requested medications are on the active medications list.  yes  Last refill. Tizanidine 03/31/2023 03/31/2023 #90 3 rf, statin 03/19/2023 - unknown quantity  Future visit scheduled.   yes  Notes to clinic.  Tizanidine is not delegated.  Statin rx was signed by Candelaria Stagers.    Requested Prescriptions  Pending Prescriptions Disp Refills   tiZANidine (ZANAFLEX) 4 MG tablet [Pharmacy Med Name: tiZANidine HCl 4 MG Oral Tablet] 90 tablet 0    Sig: TAKE 1 TABLET BY MOUTH EVERY 8 HOURS AS NEEDED FOR MUSCLE SPASM     Not Delegated - Cardiovascular:  Alpha-2 Agonists - tizanidine Failed - 04/24/2023  7:41 PM      Failed - This refill cannot be delegated      Passed - Valid encounter within last 6 months    Recent Outpatient Visits           3 weeks ago Dizziness   [facility] [place], [place], MD   3 months ago Type 1 diabetes mellitus without complication [facility])   [facility] [place], [place], MD   6 months ago Type 1 diabetes mellitus with other specified complication [facility])   [facility] [place], [place], MD   9 months ago Type 1 diabetes mellitus with other specified complication [facility])   [facility] [place], Odette Horns, MD   1 year ago Type 1 diabetes mellitus with other specified complication [facility])   [facility] [place], Odette Horns, MD       Future Appointments             In 2 months Louanne Skye, Devoria Albe., NP [facility]   In 2 months Hoy Register, MD [name]             rosuvastatin (CRESTOR) 20 MG tablet [Pharmacy Med Name: Rosuvastatin Calcium 20 MG Oral Tablet] 90 tablet 0    Sig: Take 1 tablet by mouth once daily     Cardiovascular:  Antilipid -  Statins 2 Failed - 04/24/2023  7:41 PM      Failed - Cr in normal range and within 360 days    Creat  Date Value Ref Range Status  08/01/2015 0.93 0.50 - 1.10 mg/dL Final   Creatinine, Ser  Date Value Ref Range Status  04/06/2023 1.34 (H) 0.57 - 1.00 mg/dL Final   Creatinine,U  Date Value Ref Range Status  01/30/2016 352.5 mg/dL Final         Failed - Lipid Panel in normal range within the last 12 months    Cholesterol, Total  Date Value Ref Range Status  10/09/2022 144 100 - 199 mg/dL Final   LDL Chol Calc (NIH)  Date Value Ref Range Status  10/09/2022 68 0 - 99 mg/dL Final   HDL  Date Value Ref Range Status  10/09/2022 54 >39 mg/dL Final   Triglycerides  Date Value Ref Range Status  10/09/2022 124 0 - 149 mg/dL Final         Passed - Patient is not pregnant      Passed - Valid encounter within last 12 months    Recent Outpatient Visits           3 weeks ago Dizziness  [facility] [place], [place] [employer], MD   3 months ago Type 1 diabetes mellitus without complication [facility])   [facility] [place], [place], MD   6 months ago Type 1 diabetes mellitus with other specified complication [facility])   [facility] [place], [place], MD   9 months ago Type 1 diabetes mellitus with other specified complication [facility])   [facility] [place], Odette Horns, MD   1 year ago Type 1 diabetes mellitus with other specified complication [facility])   [facility] H[place] Register, MD       Future Appointments             In 2 months Louanne Skye, Devoria Albe., NP [facility]   In 2 months Hoy Register, MD [facility]

## 2023-04-27 NOTE — Telephone Encounter (Signed)
Noted will be on the lookout for the form

## 2023-04-27 NOTE — Telephone Encounter (Signed)
Copied from CRM [id number]. Topic: General - Inquiry >> Apr 27, 2023  3:59 PM De Blanch wrote: Reason for CRM: Angelica Chessman from [facility] stated she will be re-faxing a nebulizer request. As the one they received did not have a quantity.  Please advise.

## 2023-04-28 DIAGNOSIS — Z79899 Other long term (current) drug therapy: Secondary | ICD-10-CM | POA: Diagnosis not present

## 2023-05-01 ENCOUNTER — Telehealth: Payer: Self-pay

## 2023-05-01 NOTE — Telephone Encounter (Signed)
FYI

## 2023-05-01 NOTE — Telephone Encounter (Signed)
Copied from CRM [id number]. Topic: General - Inquiry >> May 01, 2023 11:32 AM Marlow Baars wrote: Reason for CRM: Vivia Budge with [facility] called to let the provider know he was supposed to see her today but she cancelled due to emesis and not feeling well. She will be rescheduled for next week. Please assist further if necessary

## 2023-05-01 NOTE — Telephone Encounter (Signed)
Mandy with [employer] called back in checking on the status of the form she re faxed so the patient could get a nebulizer. Please assist as soon as possible

## 2023-05-05 ENCOUNTER — Ambulatory Visit (HOSPITAL_BASED_OUTPATIENT_CLINIC_OR_DEPARTMENT_OTHER): Payer: 59 | Admitting: Physical Therapy

## 2023-05-05 DIAGNOSIS — G43909 Migraine, unspecified, not intractable, without status migrainosus: Secondary | ICD-10-CM | POA: Diagnosis not present

## 2023-05-05 DIAGNOSIS — I5032 Chronic diastolic (congestive) heart failure: Secondary | ICD-10-CM | POA: Diagnosis not present

## 2023-05-05 DIAGNOSIS — E785 Hyperlipidemia, unspecified: Secondary | ICD-10-CM | POA: Diagnosis not present

## 2023-05-05 DIAGNOSIS — G4733 Obstructive sleep apnea (adult) (pediatric): Secondary | ICD-10-CM | POA: Diagnosis not present

## 2023-05-05 DIAGNOSIS — E1122 Type 2 diabetes mellitus with diabetic chronic kidney disease: Secondary | ICD-10-CM | POA: Diagnosis not present

## 2023-05-05 DIAGNOSIS — K2970 Gastritis, unspecified, without bleeding: Secondary | ICD-10-CM | POA: Diagnosis not present

## 2023-05-05 DIAGNOSIS — K219 Gastro-esophageal reflux disease without esophagitis: Secondary | ICD-10-CM | POA: Diagnosis not present

## 2023-05-05 DIAGNOSIS — J4489 Other specified chronic obstructive pulmonary disease: Secondary | ICD-10-CM | POA: Diagnosis not present

## 2023-05-05 DIAGNOSIS — J9602 Acute respiratory failure with hypercapnia: Secondary | ICD-10-CM | POA: Diagnosis not present

## 2023-05-05 DIAGNOSIS — T796XXD Traumatic ischemia of muscle, subsequent encounter: Secondary | ICD-10-CM | POA: Diagnosis not present

## 2023-05-05 DIAGNOSIS — M1990 Unspecified osteoarthritis, unspecified site: Secondary | ICD-10-CM | POA: Diagnosis not present

## 2023-05-05 DIAGNOSIS — G5600 Carpal tunnel syndrome, unspecified upper limb: Secondary | ICD-10-CM | POA: Diagnosis not present

## 2023-05-05 DIAGNOSIS — E1165 Type 2 diabetes mellitus with hyperglycemia: Secondary | ICD-10-CM | POA: Diagnosis not present

## 2023-05-05 DIAGNOSIS — I130 Hypertensive heart and chronic kidney disease with heart failure and stage 1 through stage 4 chronic kidney disease, or unspecified chronic kidney disease: Secondary | ICD-10-CM | POA: Diagnosis not present

## 2023-05-05 DIAGNOSIS — M5136 Other intervertebral disc degeneration, lumbar region: Secondary | ICD-10-CM | POA: Diagnosis not present

## 2023-05-05 DIAGNOSIS — D631 Anemia in chronic kidney disease: Secondary | ICD-10-CM | POA: Diagnosis not present

## 2023-05-05 DIAGNOSIS — K2990 Gastroduodenitis, unspecified, without bleeding: Secondary | ICD-10-CM | POA: Diagnosis not present

## 2023-05-05 DIAGNOSIS — E1140 Type 2 diabetes mellitus with diabetic neuropathy, unspecified: Secondary | ICD-10-CM | POA: Diagnosis not present

## 2023-05-05 DIAGNOSIS — G894 Chronic pain syndrome: Secondary | ICD-10-CM | POA: Diagnosis not present

## 2023-05-05 DIAGNOSIS — J9611 Chronic respiratory failure with hypoxia: Secondary | ICD-10-CM | POA: Diagnosis not present

## 2023-05-05 DIAGNOSIS — M4316 Spondylolisthesis, lumbar region: Secondary | ICD-10-CM | POA: Diagnosis not present

## 2023-05-05 DIAGNOSIS — N1831 Chronic kidney disease, stage 3a: Secondary | ICD-10-CM | POA: Diagnosis not present

## 2023-05-07 DIAGNOSIS — G894 Chronic pain syndrome: Secondary | ICD-10-CM | POA: Diagnosis not present

## 2023-05-07 DIAGNOSIS — J9602 Acute respiratory failure with hypercapnia: Secondary | ICD-10-CM | POA: Diagnosis not present

## 2023-05-07 DIAGNOSIS — G43909 Migraine, unspecified, not intractable, without status migrainosus: Secondary | ICD-10-CM | POA: Diagnosis not present

## 2023-05-07 DIAGNOSIS — K2970 Gastritis, unspecified, without bleeding: Secondary | ICD-10-CM | POA: Diagnosis not present

## 2023-05-07 DIAGNOSIS — E1140 Type 2 diabetes mellitus with diabetic neuropathy, unspecified: Secondary | ICD-10-CM | POA: Diagnosis not present

## 2023-05-07 DIAGNOSIS — M5136 Other intervertebral disc degeneration, lumbar region: Secondary | ICD-10-CM | POA: Diagnosis not present

## 2023-05-07 DIAGNOSIS — E1165 Type 2 diabetes mellitus with hyperglycemia: Secondary | ICD-10-CM | POA: Diagnosis not present

## 2023-05-07 DIAGNOSIS — D631 Anemia in chronic kidney disease: Secondary | ICD-10-CM | POA: Diagnosis not present

## 2023-05-07 DIAGNOSIS — G4733 Obstructive sleep apnea (adult) (pediatric): Secondary | ICD-10-CM | POA: Diagnosis not present

## 2023-05-07 DIAGNOSIS — E785 Hyperlipidemia, unspecified: Secondary | ICD-10-CM | POA: Diagnosis not present

## 2023-05-07 DIAGNOSIS — N1831 Chronic kidney disease, stage 3a: Secondary | ICD-10-CM | POA: Diagnosis not present

## 2023-05-07 DIAGNOSIS — K2990 Gastroduodenitis, unspecified, without bleeding: Secondary | ICD-10-CM | POA: Diagnosis not present

## 2023-05-07 DIAGNOSIS — M1990 Unspecified osteoarthritis, unspecified site: Secondary | ICD-10-CM | POA: Diagnosis not present

## 2023-05-07 DIAGNOSIS — K219 Gastro-esophageal reflux disease without esophagitis: Secondary | ICD-10-CM | POA: Diagnosis not present

## 2023-05-07 DIAGNOSIS — I5032 Chronic diastolic (congestive) heart failure: Secondary | ICD-10-CM | POA: Diagnosis not present

## 2023-05-07 DIAGNOSIS — J9610 Chronic respiratory failure, unspecified whether with hypoxia or hypercapnia: Secondary | ICD-10-CM | POA: Diagnosis not present

## 2023-05-07 DIAGNOSIS — G5600 Carpal tunnel syndrome, unspecified upper limb: Secondary | ICD-10-CM | POA: Diagnosis not present

## 2023-05-07 DIAGNOSIS — T796XXD Traumatic ischemia of muscle, subsequent encounter: Secondary | ICD-10-CM | POA: Diagnosis not present

## 2023-05-07 DIAGNOSIS — I130 Hypertensive heart and chronic kidney disease with heart failure and stage 1 through stage 4 chronic kidney disease, or unspecified chronic kidney disease: Secondary | ICD-10-CM | POA: Diagnosis not present

## 2023-05-07 DIAGNOSIS — J45909 Unspecified asthma, uncomplicated: Secondary | ICD-10-CM | POA: Diagnosis not present

## 2023-05-07 DIAGNOSIS — J9611 Chronic respiratory failure with hypoxia: Secondary | ICD-10-CM | POA: Diagnosis not present

## 2023-05-07 DIAGNOSIS — M4316 Spondylolisthesis, lumbar region: Secondary | ICD-10-CM | POA: Diagnosis not present

## 2023-05-07 DIAGNOSIS — J4489 Other specified chronic obstructive pulmonary disease: Secondary | ICD-10-CM | POA: Diagnosis not present

## 2023-05-07 DIAGNOSIS — J449 Chronic obstructive pulmonary disease, unspecified: Secondary | ICD-10-CM | POA: Diagnosis not present

## 2023-05-07 DIAGNOSIS — E1122 Type 2 diabetes mellitus with diabetic chronic kidney disease: Secondary | ICD-10-CM | POA: Diagnosis not present

## 2023-05-11 DIAGNOSIS — J4540 Moderate persistent asthma, uncomplicated: Secondary | ICD-10-CM | POA: Diagnosis not present

## 2023-05-12 ENCOUNTER — Other Ambulatory Visit: Payer: Self-pay | Admitting: Family Medicine

## 2023-05-12 DIAGNOSIS — J4540 Moderate persistent asthma, uncomplicated: Secondary | ICD-10-CM | POA: Diagnosis not present

## 2023-05-12 DIAGNOSIS — E109 Type 1 diabetes mellitus without complications: Secondary | ICD-10-CM

## 2023-05-12 DIAGNOSIS — R112 Nausea with vomiting, unspecified: Secondary | ICD-10-CM

## 2023-05-13 ENCOUNTER — Other Ambulatory Visit: Payer: Self-pay

## 2023-05-13 ENCOUNTER — Other Ambulatory Visit (HOSPITAL_COMMUNITY): Payer: Self-pay

## 2023-05-13 MED ORDER — ONDANSETRON HCL 4 MG PO TABS
4.0000 mg | ORAL_TABLET | Freq: Three times a day (TID) | ORAL | 0 refills | Status: DC | PRN
Start: 2023-05-13 — End: 2023-06-08
  Filled 2023-05-13: qty 30, 10d supply, fill #0

## 2023-05-13 MED ORDER — TIRZEPATIDE 10 MG/0.5ML ~~LOC~~ SOAJ
10.0000 mg | SUBCUTANEOUS | 0 refills | Status: DC
  Filled 2023-05-13 – 2023-05-27 (×3): qty 2, 28d supply, fill #0

## 2023-05-14 DIAGNOSIS — G43909 Migraine, unspecified, not intractable, without status migrainosus: Secondary | ICD-10-CM | POA: Diagnosis not present

## 2023-05-14 DIAGNOSIS — N1831 Chronic kidney disease, stage 3a: Secondary | ICD-10-CM | POA: Diagnosis not present

## 2023-05-14 DIAGNOSIS — D631 Anemia in chronic kidney disease: Secondary | ICD-10-CM | POA: Diagnosis not present

## 2023-05-14 DIAGNOSIS — M4316 Spondylolisthesis, lumbar region: Secondary | ICD-10-CM | POA: Diagnosis not present

## 2023-05-14 DIAGNOSIS — E1165 Type 2 diabetes mellitus with hyperglycemia: Secondary | ICD-10-CM | POA: Diagnosis not present

## 2023-05-14 DIAGNOSIS — E1140 Type 2 diabetes mellitus with diabetic neuropathy, unspecified: Secondary | ICD-10-CM | POA: Diagnosis not present

## 2023-05-14 DIAGNOSIS — K2970 Gastritis, unspecified, without bleeding: Secondary | ICD-10-CM | POA: Diagnosis not present

## 2023-05-14 DIAGNOSIS — K219 Gastro-esophageal reflux disease without esophagitis: Secondary | ICD-10-CM | POA: Diagnosis not present

## 2023-05-14 DIAGNOSIS — G894 Chronic pain syndrome: Secondary | ICD-10-CM | POA: Diagnosis not present

## 2023-05-14 DIAGNOSIS — M5136 Other intervertebral disc degeneration, lumbar region: Secondary | ICD-10-CM | POA: Diagnosis not present

## 2023-05-14 DIAGNOSIS — E785 Hyperlipidemia, unspecified: Secondary | ICD-10-CM | POA: Diagnosis not present

## 2023-05-14 DIAGNOSIS — J9602 Acute respiratory failure with hypercapnia: Secondary | ICD-10-CM | POA: Diagnosis not present

## 2023-05-14 DIAGNOSIS — J4489 Other specified chronic obstructive pulmonary disease: Secondary | ICD-10-CM | POA: Diagnosis not present

## 2023-05-14 DIAGNOSIS — I130 Hypertensive heart and chronic kidney disease with heart failure and stage 1 through stage 4 chronic kidney disease, or unspecified chronic kidney disease: Secondary | ICD-10-CM | POA: Diagnosis not present

## 2023-05-14 DIAGNOSIS — J9611 Chronic respiratory failure with hypoxia: Secondary | ICD-10-CM | POA: Diagnosis not present

## 2023-05-14 DIAGNOSIS — G5600 Carpal tunnel syndrome, unspecified upper limb: Secondary | ICD-10-CM | POA: Diagnosis not present

## 2023-05-14 DIAGNOSIS — K2990 Gastroduodenitis, unspecified, without bleeding: Secondary | ICD-10-CM | POA: Diagnosis not present

## 2023-05-14 DIAGNOSIS — E1122 Type 2 diabetes mellitus with diabetic chronic kidney disease: Secondary | ICD-10-CM | POA: Diagnosis not present

## 2023-05-14 DIAGNOSIS — G4733 Obstructive sleep apnea (adult) (pediatric): Secondary | ICD-10-CM | POA: Diagnosis not present

## 2023-05-14 DIAGNOSIS — I5032 Chronic diastolic (congestive) heart failure: Secondary | ICD-10-CM | POA: Diagnosis not present

## 2023-05-14 DIAGNOSIS — T796XXD Traumatic ischemia of muscle, subsequent encounter: Secondary | ICD-10-CM | POA: Diagnosis not present

## 2023-05-14 DIAGNOSIS — M1990 Unspecified osteoarthritis, unspecified site: Secondary | ICD-10-CM | POA: Diagnosis not present

## 2023-05-20 ENCOUNTER — Encounter: Payer: Self-pay | Admitting: Family Medicine

## 2023-05-21 ENCOUNTER — Other Ambulatory Visit (HOSPITAL_COMMUNITY): Payer: Self-pay

## 2023-05-21 DIAGNOSIS — Z760 Encounter for issue of repeat prescription: Secondary | ICD-10-CM | POA: Diagnosis not present

## 2023-05-21 DIAGNOSIS — M5416 Radiculopathy, lumbar region: Secondary | ICD-10-CM | POA: Diagnosis not present

## 2023-05-21 DIAGNOSIS — Z79899 Other long term (current) drug therapy: Secondary | ICD-10-CM | POA: Diagnosis not present

## 2023-05-21 DIAGNOSIS — R030 Elevated blood-pressure reading, without diagnosis of hypertension: Secondary | ICD-10-CM | POA: Diagnosis not present

## 2023-05-27 ENCOUNTER — Other Ambulatory Visit (HOSPITAL_COMMUNITY): Payer: Self-pay

## 2023-05-28 ENCOUNTER — Ambulatory Visit (HOSPITAL_BASED_OUTPATIENT_CLINIC_OR_DEPARTMENT_OTHER): Payer: 59 | Admitting: Physical Therapy
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
## 2023-06-01 ENCOUNTER — Other Ambulatory Visit: Payer: Self-pay

## 2023-06-01 ENCOUNTER — Other Ambulatory Visit: Payer: Self-pay | Admitting: Family Medicine

## 2023-06-01 ENCOUNTER — Ambulatory Visit: Payer: Medicaid Other | Admitting: Neurology

## 2023-06-01 ENCOUNTER — Encounter: Payer: Self-pay | Admitting: Neurology

## 2023-06-01 ENCOUNTER — Encounter: Payer: Self-pay | Admitting: Family Medicine

## 2023-06-01 DIAGNOSIS — E119 Type 2 diabetes mellitus without complications: Secondary | ICD-10-CM

## 2023-06-01 MED ORDER — FREESTYLE LIBRE 2 SENSOR MISC
0 refills | Status: DC
Start: 2023-06-01 — End: 2023-06-09

## 2023-06-01 MED ORDER — FREESTYLE LIBRE 2 READER DEVI
0 refills | Status: DC
Start: 2023-06-01 — End: 2023-07-29

## 2023-06-02 MED ORDER — TIZANIDINE HCL 4 MG PO TABS: 4.0000 mg | ORAL_TABLET | Freq: Three times a day (TID) | ORAL | 3 refills | Status: DC | PRN

## 2023-06-05 ENCOUNTER — Other Ambulatory Visit: Payer: Self-pay | Admitting: Family Medicine

## 2023-06-05 ENCOUNTER — Encounter: Payer: Self-pay | Admitting: Family Medicine

## 2023-06-05 ENCOUNTER — Telehealth: Payer: Self-pay

## 2023-06-05 ENCOUNTER — Other Ambulatory Visit (HOSPITAL_COMMUNITY): Payer: Self-pay

## 2023-06-05 DIAGNOSIS — E119 Type 2 diabetes mellitus without complications: Secondary | ICD-10-CM

## 2023-06-05 MED ORDER — ONETOUCH VERIO FLEX SYSTEM W/DEVICE KIT
PACK | 0 refills | Status: DC
Start: 2023-06-05 — End: 2023-07-29
  Filled 2023-06-05: qty 1, 30d supply, fill #0

## 2023-06-05 NOTE — Telephone Encounter (Signed)
Spoke with patient re: Mychart message to provider. Advised that based on her S/S, as well as the office having no open appointments this afternoon; she should go to UC to be assessed. Patient voiced that she  will go as soon as she get off the phone with me.

## 2023-06-07 DIAGNOSIS — J449 Chronic obstructive pulmonary disease, unspecified: Secondary | ICD-10-CM | POA: Diagnosis not present

## 2023-06-07 DIAGNOSIS — J45909 Unspecified asthma, uncomplicated: Secondary | ICD-10-CM | POA: Diagnosis not present

## 2023-06-07 DIAGNOSIS — G4733 Obstructive sleep apnea (adult) (pediatric): Secondary | ICD-10-CM | POA: Diagnosis not present

## 2023-06-07 DIAGNOSIS — J9610 Chronic respiratory failure, unspecified whether with hypoxia or hypercapnia: Secondary | ICD-10-CM | POA: Diagnosis not present

## 2023-06-08 ENCOUNTER — Other Ambulatory Visit: Payer: Self-pay | Admitting: Internal Medicine

## 2023-06-08 DIAGNOSIS — J449 Chronic obstructive pulmonary disease, unspecified: Secondary | ICD-10-CM | POA: Diagnosis not present

## 2023-06-08 DIAGNOSIS — G4733 Obstructive sleep apnea (adult) (pediatric): Secondary | ICD-10-CM | POA: Diagnosis not present

## 2023-06-08 DIAGNOSIS — R112 Nausea with vomiting, unspecified: Secondary | ICD-10-CM

## 2023-06-08 DIAGNOSIS — J45909 Unspecified asthma, uncomplicated: Secondary | ICD-10-CM | POA: Diagnosis not present

## 2023-06-08 DIAGNOSIS — Z930 Tracheostomy status: Secondary | ICD-10-CM | POA: Diagnosis not present

## 2023-06-09 ENCOUNTER — Other Ambulatory Visit: Payer: Self-pay | Admitting: Family Medicine

## 2023-06-09 DIAGNOSIS — R112 Nausea with vomiting, unspecified: Secondary | ICD-10-CM

## 2023-06-09 DIAGNOSIS — E119 Type 2 diabetes mellitus without complications: Secondary | ICD-10-CM

## 2023-06-09 NOTE — Telephone Encounter (Signed)
Requested medication (s) are due for refill today: Due 06/13/23  Requested medication (s) are on the active medication list: yes    Last refill: 05/13/23   #30  0 refills  Future visit scheduled Yes 07/06/23  Notes to clinic:Not delegated, please review. Thank you.  Requested Prescriptions  Pending Prescriptions Disp Refills   ondansetron (ZOFRAN) 4 MG tablet 30 tablet 0    Sig: Take 1 tablet (4 mg total) by mouth every 8 (eight) hours as needed for nausea or vomiting.     Not Delegated - Gastroenterology: Antiemetics - ondansetron Failed - 06/08/2023  2:01 PM      Failed - This refill cannot be delegated      Passed - AST in normal range and within 360 days    AST  Date Value Ref Range Status  04/06/2023 15 0 - 40 IU/L Final         Passed - ALT in normal range and within 360 days    ALT  Date Value Ref Range Status  04/06/2023 17 0 - 32 IU/L Final         Passed - Valid encounter within last 6 months    Recent Outpatient Visits           2 months ago Dizziness   [place] [facility] [place], [place], MD   4 months ago Type 1 diabetes mellitus without complication [facility])   [place] [facility] [place], [place], MD   8 months ago Type 1 diabetes mellitus with other specified complication [facility])   [place] [facility] [place], [place], MD   11 months ago Type 1 diabetes mellitus with other specified complication [facility])   [place] [facility] [place], Odette Horns, MD   1 year ago Type 1 diabetes mellitus with other specified complication [facility])   [place] [facility] Hoy Register, MD       Future Appointments             In 3 weeks Louanne Skye, Devoria Albe., NP [facility]   In 3 weeks Hoy Register, MD [facility]

## 2023-06-10 ENCOUNTER — Other Ambulatory Visit (HOSPITAL_COMMUNITY): Payer: Self-pay

## 2023-06-10 MED ORDER — ONDANSETRON HCL 4 MG PO TABS
4.0000 mg | ORAL_TABLET | Freq: Three times a day (TID) | ORAL | 0 refills | Status: DC | PRN
  Filled 2023-06-10: qty 30, 10d supply, fill #0

## 2023-06-10 MED ORDER — FREESTYLE LIBRE 2 SENSOR MISC
0 refills | Status: DC
Start: 2023-06-10 — End: 2023-07-29

## 2023-06-11 ENCOUNTER — Other Ambulatory Visit: Payer: Self-pay

## 2023-06-11 ENCOUNTER — Other Ambulatory Visit (HOSPITAL_COMMUNITY): Payer: Self-pay

## 2023-06-11 ENCOUNTER — Other Ambulatory Visit: Payer: Self-pay | Admitting: Family Medicine

## 2023-06-11 ENCOUNTER — Encounter (HOSPITAL_COMMUNITY): Payer: Self-pay

## 2023-06-11 DIAGNOSIS — R112 Nausea with vomiting, unspecified: Secondary | ICD-10-CM

## 2023-06-11 DIAGNOSIS — J4540 Moderate persistent asthma, uncomplicated: Secondary | ICD-10-CM | POA: Diagnosis not present

## 2023-06-11 MED ORDER — ONDANSETRON HCL 4 MG PO TABS
4.0000 mg | ORAL_TABLET | Freq: Three times a day (TID) | ORAL | 0 refills | Status: DC | PRN
Start: 2023-06-11 — End: 2023-07-02
  Filled 2023-06-11: qty 60, 20d supply, fill #0

## 2023-06-11 MED ORDER — TIRZEPATIDE 12.5 MG/0.5ML ~~LOC~~ SOAJ
12.5000 mg | SUBCUTANEOUS | 0 refills | Status: DC
  Filled 2023-06-11 – 2023-06-16 (×3): qty 2, 28d supply, fill #0

## 2023-06-11 NOTE — Telephone Encounter (Signed)
Requested medication (s) are due for refill today: no last signed 06/10/23  Requested medication (s) are on the active medication list: yes   Last refill:  06/10/23 #30 0 refills  Future visit scheduled: yes in 3 weeks   Notes to clinic:   not delegated per protocol. Signed 06/10/23. Do you want to refill Rx?     Requested Prescriptions  Pending Prescriptions Disp Refills   ondansetron (ZOFRAN) 4 MG tablet [Pharmacy Med Name: Ondansetron HCl 4 MG Oral Tablet] 30 tablet 0    Sig: TAKE 1 TABLET BY MOUTH EVERY 8 HOURS AS NEEDED FOR NAUSEA FOR VOMITING     Not Delegated - Gastroenterology: Antiemetics - ondansetron Failed - 06/09/2023  4:04 PM      Failed - This refill cannot be delegated      Passed - AST in normal range and within 360 days    AST  Date Value Ref Range Status  04/06/2023 15 0 - 40 IU/L Final         Passed - ALT in normal range and within 360 days    ALT  Date Value Ref Range Status  04/06/2023 17 0 - 32 IU/L Final         Passed - Valid encounter within last 6 months    Recent Outpatient Visits           2 months ago Dizziness   [place] [place], [place], MD   4 months ago Type 1 diabetes mellitus without complication [facility])   [place] [facility] [place], [place], MD   8 months ago Type 1 diabetes mellitus with other specified complication [facility])   [place] [facility] [place], [place], MD   11 months ago Type 1 diabetes mellitus with other specified complication [facility])   [place] [facility] [place], Odette Horns, MD   1 year ago Type 1 diabetes mellitus with other specified complication [facility])   [place] [facility] Hoy Register, MD       Future Appointments             In 3 weeks Louanne Skye, Devoria Albe., NP [facility]   In 3 weeks Hoy Register, MD [facility]  [facility]

## 2023-06-12 ENCOUNTER — Other Ambulatory Visit (HOSPITAL_COMMUNITY): Payer: Self-pay

## 2023-06-16 ENCOUNTER — Other Ambulatory Visit (HOSPITAL_COMMUNITY): Payer: Self-pay

## 2023-06-16 ENCOUNTER — Ambulatory Visit (HOSPITAL_COMMUNITY)
Admission: RE | Admit: 2023-06-16 | Discharge: 2023-06-16 | Disposition: A | Discharge status: Home / Self Care | Payer: 59 | Source: Ambulatory Visit | Attending: Cardiology | Admitting: Cardiology

## 2023-06-16 ENCOUNTER — Other Ambulatory Visit: Payer: Self-pay | Admitting: Critical Care Medicine

## 2023-06-16 DIAGNOSIS — R079 Chest pain, unspecified: Secondary | ICD-10-CM | POA: Diagnosis not present

## 2023-06-16 DIAGNOSIS — G43119 Migraine with aura, intractable, without status migrainosus: Secondary | ICD-10-CM

## 2023-06-16 DIAGNOSIS — I5032 Chronic diastolic (congestive) heart failure: Secondary | ICD-10-CM | POA: Insufficient documentation

## 2023-06-16 LAB — NM PET CT CARDIAC PERFUSION MULTI W/ABSOLUTE BLOODFLOW
MBFR: 2.24
Nuc Rest EF: 62 %
Nuc Stress EF: 58 %
Rest MBF: 1.25 ml/g/min
Rest Nuclear Isotope Dose: 29.1 mCi
ST Depression (mm): 0 mm
Stress MBF: 2.8 ml/g/min
Stress Nuclear Isotope Dose: 29.1 mCi
TID: 1.05

## 2023-06-16 MED ORDER — RUBIDIUM RB82 GENERATOR (RUBYFILL)
29.0600 | PACK | Freq: Once | INTRAVENOUS | Status: AC
  Administered 2023-06-16: 29.06 via INTRAVENOUS

## 2023-06-16 MED ORDER — RUBIDIUM RB82 GENERATOR (RUBYFILL)
29.1100 | PACK | Freq: Once | INTRAVENOUS | Status: AC
  Administered 2023-06-16: 29.11 via INTRAVENOUS

## 2023-06-16 MED ORDER — AMITRIPTYLINE HCL 10 MG PO TABS
10.0000 mg | ORAL_TABLET | Freq: Every day | ORAL | 0 refills | Status: DC
  Filled 2023-06-16 – 2023-06-24 (×2): qty 30, 30d supply, fill #0

## 2023-06-16 MED ORDER — REGADENOSON 0.4 MG/5ML IV SOLN
INTRAVENOUS | Status: AC
  Filled 2023-06-16: qty 5

## 2023-06-16 MED ORDER — REGADENOSON 0.4 MG/5ML IV SOLN
0.4000 mg | Freq: Once | INTRAVENOUS | Status: AC
  Administered 2023-06-16: 0.4 mg via INTRAVENOUS

## 2023-06-17 ENCOUNTER — Other Ambulatory Visit: Payer: Self-pay

## 2023-06-17 ENCOUNTER — Telehealth: Payer: Self-pay | Admitting: Nurse Practitioner

## 2023-06-17 ENCOUNTER — Other Ambulatory Visit (HOSPITAL_COMMUNITY): Payer: Self-pay

## 2023-06-17 NOTE — Telephone Encounter (Signed)
Gaston Islam., NP  P Cv Div [facility] St Triage Please let patient know that her event monitor results showed a normal study with normal heart pumping function and perfusion.  There was no evidence of coronary calcium with minimal calcium around the aortic valve noted.  There were incidental finding of Steatosis (fatty liver) that should be followed up with patient's PCP.  Please forward a copy of this report to patient's PCP.  Called patient with results.

## 2023-06-17 NOTE — Telephone Encounter (Signed)
Pt is returning nurse call regarding results and is requesting a callback. Please advise

## 2023-06-19 DIAGNOSIS — R030 Elevated blood-pressure reading, without diagnosis of hypertension: Secondary | ICD-10-CM | POA: Diagnosis not present

## 2023-06-19 DIAGNOSIS — Z79899 Other long term (current) drug therapy: Secondary | ICD-10-CM | POA: Diagnosis not present

## 2023-06-19 DIAGNOSIS — M5416 Radiculopathy, lumbar region: Secondary | ICD-10-CM | POA: Diagnosis not present

## 2023-06-22 ENCOUNTER — Other Ambulatory Visit (HOSPITAL_COMMUNITY): Payer: Self-pay

## 2023-06-22 ENCOUNTER — Ambulatory Visit (INDEPENDENT_AMBULATORY_CARE_PROVIDER_SITE_OTHER): Payer: 59 | Admitting: Primary Care

## 2023-06-22 ENCOUNTER — Encounter: Payer: Self-pay | Admitting: Primary Care

## 2023-06-22 VITALS — BP 112/88 | HR 104 | Ht 61.0 in | Wt 246.0 lb

## 2023-06-22 DIAGNOSIS — Z930 Tracheostomy status: Secondary | ICD-10-CM

## 2023-06-22 DIAGNOSIS — J4550 Severe persistent asthma, uncomplicated: Secondary | ICD-10-CM | POA: Diagnosis not present

## 2023-06-22 DIAGNOSIS — J398 Other specified diseases of upper respiratory tract: Secondary | ICD-10-CM

## 2023-06-22 DIAGNOSIS — J9611 Chronic respiratory failure with hypoxia: Secondary | ICD-10-CM

## 2023-06-22 DIAGNOSIS — G4733 Obstructive sleep apnea (adult) (pediatric): Secondary | ICD-10-CM

## 2023-06-22 NOTE — Patient Instructions (Addendum)
Recommendations: Try sleeping with wedge pillow at night Continue 3L oxygen at bedtime   Orders: ONO on 3L  Follow-up: Please schedule visit with Dr. Wynona Neat in November

## 2023-06-22 NOTE — Assessment & Plan Note (Signed)
-   Currently stable; No acute respiratory symptoms. She has a chronic cough. Maintained on Dulera and Singulair. Following with Dr. Alvis Lemmings.

## 2023-06-22 NOTE — Progress Notes (Signed)
@Patient  ID: [name], female    DOB: 03-04-1971, 52 y.o.   MRN: [id number]  Chief Complaint  Patient presents with   Consult    Referring provider: Hoy Register, MD  HPI: 52 year old female, former smoker.  Past medical history significant for hypertension, chronic diastolic heart failure, OSA, chronic respiratory failure with hypoxia, severe persistent asthma, type 1 diabetes.  06/22/2023 Patient presents today for sleep consult due to nocturnal hypoxemia. She is wearing 3 L of oxygen at bedtime.  She has had a tracheostomy since 2018 due to traumatic intubation from asthma exacerbation.  She follows with ENT.  Dr. Alvis Lemmings is managing her asthma. Maintained on Dulera , singulair and prn Albuterol.   She is not sleeping well, she has a hard time falling and staying asleep. She goes to bed at 9pm. It can take upwards of 3+ hours to fall asleep. Typically after she falls asleep she will wake up 2 hours later. She sleeps with 4 pillows. Feels like she is choking/losing her breath at night. She has chronic back pain. She follows with pain management. She takes melatonin 10mg , Tizanidine 4mg  and oxycodone at bedtime.    Sleep questionnaire Symptoms-   bradycardia, waking up gasping/choking  Prior sleep study-  2013 Bedtime- 8-9pm Time to fall asleep- several hours or longer  Nocturnal awakenings- 2 or more times  Out of bed/start of day- depends on what time she goes to bed  Weight changes- 15-20lbs  Do you operate heavy machinery- no Do you currently wear CPAP- no Do you current wear oxygen- 3L [place]- 10    Allergies  Allergen Reactions   Reglan [Metoclopramide] Other (See Comments)    Panic attack    Immunization History  Administered Date(s) Administered   Influenza,inj,Quad PF,6+ Mos 06/05/2014, 08/01/2015, 07/02/2016, 06/19/2017, 06/20/2021, 07/09/2022   PFIZER(Purple Top)SARS-COV-2 Vaccination 08/29/2020   Pneumococcal Polysaccharide-23  04/29/2013, 02/21/2017   Zoster Recombinant(Shingrix) 10/09/2022    Past Medical History:  Diagnosis Date   Abnormal UGI series    Acute pain of right shoulder due to trauma 10/05/2018   Acute respiratory failure with hypoxia and hypercapnia (HCC) 10/23/2018   Allergy    Anemia    Anxiety    Arthritis    Asthma    Chest pain of uncertain etiology 03/22/2019   Chronic back pain    Chronic chest pain    resolved, no problems since 2019 per patient 07/27/19   Coffee ground vomiting 10/14/2021   COPD (chronic obstructive pulmonary disease) (HCC)    inhaler   Depression    DM (diabetes mellitus) (HCC)    INSULIN DEPENDENT - TYPE 1   Dyspnea    GERD (gastroesophageal reflux disease)    Headache(784.0)    History of hiatal hernia    Hyperlipidemia    no med, diet controlled   Hypertension    Hypokalemia    Klebsiella cystitis 03/14/2017   Last Assessment & Plan:   Formatting of this note might be different from the original.  - On Zosyn  - Urine culture: Klebsiella pneumoniae   Neuromuscular disorder (HCC)    Osteoarthritis    Oxygen deficiency    Pneumonia    Respiratory disease 05/2017   uses inhalers, neb tx prn, no oxygen   Rotator cuff tear 07/28/2019   Sleep apnea    does not use CPAP due to trach, uses 2L supplement oxygen at night   Status post tracheostomy (HCC) 03/14/2017   Last Assessment & Plan:  Formatting  of this note might be different from the original. - VERY CRITICAL AIRWAY WITH EXTREME PRECUATION - On tach - ENT following along (please see above for current trach management plan)   Tracheostomy in place [facility]) 02/2017    Tobacco History: Social History   Tobacco Use  Smoking Status Former   Current packs/day: 0.00   Average packs/day: 0.5 packs/day for 25.0 years (12.5 ttl pk-yrs)   Types: Cigarettes   Start date: 01/21/1991   Quit date: 01/21/2016   Years since quitting: 7.4  Smokeless Tobacco Never   Counseling given: Not Answered   Outpatient  Medications Prior to Visit  Medication Sig Dispense Refill   acetaminophen (TYLENOL) 500 MG tablet Take 1,000 mg by mouth 2 (two) times daily as needed for moderate pain, fever or headache.     albuterol (PROVENTIL) (2.5 MG/3ML) 0.083% nebulizer solution Take 3 mLs (2.5 mg total) by nebulization every 6 (six) hours as needed for wheezing or shortness of breath. 75 mL 12   albuterol (VENTOLIN HFA) 108 (90 Base) MCG/ACT inhaler INHALE 2 PUFFS BY MOUTH EVERY 6 HOURS AS NEEDED FOR WHEEZING FOR SHORTNESS OF BREATH (Patient taking differently: Inhale 2 puffs into the lungs every 6 (six) hours as needed for shortness of breath or wheezing.) 18 g 0   amitriptyline (ELAVIL) 10 MG tablet Take 1 tablet (10 mg total) by mouth at bedtime. 30 tablet 0   Blood Glucose Monitoring Suppl (ONETOUCH VERIO FLEX SYSTEM) w/Device KIT Use to check glucose levels daily 1 kit 0   buPROPion (WELLBUTRIN SR) 150 MG 12 hr tablet Take 1 tablet (150 mg total) by mouth 2 (two) times daily. 180 tablet 1   cetirizine (ZYRTEC) 10 MG tablet TAKE 1 TABLET BY MOUTH AT BEDTIME 30 tablet 0   Continuous Glucose Receiver (FREESTYLE LIBRE 2 READER) DEVI USE TO CHECK BLOOD SUGAR THREE TIMES DAILY. 1 each 0   Continuous Glucose Sensor (FREESTYLE LIBRE 2 SENSOR) MISC USE TO CHECK BLOOD GLUCOSE THREE TIMES DAILY.  CHANGE SENSOR ONCE EVERY 14 DAYS. 6 each 0   dapagliflozin propanediol (FARXIGA) 10 MG TABS tablet Take 1 tablet (10 mg total) by mouth daily before breakfast. 90 tablet 1   fluconazole (DIFLUCAN) 150 MG tablet TAKE ONE TABLET BY MOUTH AS A ONE-TIME DOSE 1 tablet 0   furosemide (LASIX) 40 MG tablet Take 1 tablet (40 mg total) by mouth daily as needed for fluid or edema. 30 tablet 1   glucose blood (ONETOUCH VERIO) test strip Use to check glucose levels daily; E11.9 100 each 6   insulin degludec (TRESIBA FLEXTOUCH) 200 UNIT/ML FlexTouch Pen Inject 170 Units into the skin daily. 54 mL 3   insulin lispro (HUMALOG KWIKPEN) 100 UNIT/ML  KwikPen Inject 10 Units into the skin 3 (three) times daily. (Patient taking differently: Inject 10 Units into the skin with breakfast, with lunch, and with evening meal.) 15 mL 6   Insulin Pen Needle 31G X 8 MM MISC Use to inject [place] and Humalog. Pt may use 4 pen needles daily. 100 each 11   lisinopril (ZESTRIL) 20 MG tablet Take 1 tablet (20 mg total) by mouth daily. 90 tablet 1   melatonin 3 MG TABS tablet Take 1 tablet (3 mg total) by mouth at bedtime. 30 tablet 3   Misc. Devices MISC Shower chair 1 each 0   Misc. Devices MISC Nebulizer device. Diagnosis - Asthma 1 each 0   mometasone-formoterol (DULERA) 200-5 MCG/ACT AERO Inhale 2 puffs into the lungs in  the morning and at bedtime. 1 each 6   montelukast (SINGULAIR) 10 MG tablet Take 1 tablet (10 mg total) by mouth at bedtime. 90 tablet 1   ondansetron (ZOFRAN) 4 MG tablet Take 1 tablet (4 mg total) by mouth every 8 (eight) hours as needed for nausea or vomiting. 60 tablet 0   OneTouch Delica Lancets 33G MISC Use to check glucose levels daily; E11.9 100 each 3   oxyCODONE-acetaminophen (PERCOCET) 10-325 MG tablet Take 1 tablet by mouth 4 (four) times daily as needed for pain.     pantoprazole (PROTONIX) 40 MG tablet Take 1 tablet (40 mg total) by mouth 2 (two) times daily. 180 tablet 1   pregabalin (LYRICA) 50 MG capsule TAKE 1 CAPSULE BY MOUTH THREE TIMES DAILY IN  THE  MORNING,  AT  NOON,  AND  AT  BEDTIME 90 capsule 0   rosuvastatin (CRESTOR) 20 MG tablet Take 1 tablet by mouth once daily 90 tablet 0   sertraline (ZOLOFT) 100 MG tablet Take 2 tablets (200 mg total) by mouth at bedtime. 90 tablet 1   tirzepatide (MOUNJARO) 12.5 MG/0.5ML Pen Inject 12.5 mg into the skin once a week. For 4 weeks then increase to 15 mg weekly thereafter. 2 mL 0   tiZANidine (ZANAFLEX) 4 MG tablet Take 1 tablet (4 mg total) by mouth every 8 (eight) hours as needed for muscle spasms. 90 tablet 3   Vitamin D, Ergocalciferol, (DRISDOL) 1.25 MG (50000 UNIT) CAPS  capsule Take 1 capsule (50,000 Units total) by mouth every 7 (seven) days. 5 capsule 0   amLODipine (NORVASC) 5 MG tablet Take 1 tablet (5 mg total) by mouth daily. 180 tablet 3   No facility-administered medications prior to visit.    Review of Systems  Review of Systems  Constitutional:  Positive for fatigue.  Respiratory:  Positive for apnea and cough.   Psychiatric/Behavioral:  Positive for sleep disturbance.     Physical Exam  BP 112/88 (BP Location: Left Arm, Patient Position: Sitting, Cuff Size: Large)   Pulse (!) 104   Ht 5\' 1"  (1.549 m)   Wt 246 lb (111.6 kg)   LMP  (LMP Unknown)   SpO2 98%   BMI 46.48 kg/m  Physical Exam Constitutional:      Appearance: Normal appearance. She is not ill-appearing.  HENT:     Head:     Comments: Tracheostomy  Cardiovascular:     Rate and Rhythm: Normal rate and regular rhythm.  Pulmonary:     Effort: Pulmonary effort is normal.     Breath sounds: No wheezing, rhonchi or rales.     Comments: Productive cough  Musculoskeletal:        General: Normal range of motion.  Skin:    General: Skin is warm and dry.  Neurological:     General: No focal deficit present.     Mental Status: She is alert and oriented to person, place, and time. Mental status is at baseline.  Psychiatric:        Mood and Affect: Mood normal.        Behavior: Behavior normal.        Thought Content: Thought content normal.        Judgment: Judgment normal.      Lab Results:  CBC    Component Value Date/Time   WBC 7.7 03/12/2023 0059   RBC 3.59 (L) 03/12/2023 0059   HGB 10.2 (L) 03/12/2023 0059   HGB 11.9 04/12/2018 1035  HCT 31.4 (L) 03/12/2023 0059   HCT 34.8 04/12/2018 1035   PLT 249 03/12/2023 0059   PLT 373 04/12/2018 1035   MCV 87.5 03/12/2023 0059   MCV 84 04/12/2018 1035   MCH 28.4 03/12/2023 0059   MCHC 32.5 03/12/2023 0059   RDW 14.6 03/12/2023 0059   RDW 14.6 04/12/2018 1035   LYMPHSABS 3.7 03/11/2023 0040   LYMPHSABS 4.0 (H)  04/12/2018 1035   MONOABS 0.7 03/11/2023 0040   EOSABS 0.1 03/11/2023 0040   EOSABS 0.0 04/12/2018 1035   BASOSABS 0.0 03/11/2023 0040   BASOSABS 0.0 04/12/2018 1035    BMET    Component Value Date/Time   NA 142 04/06/2023 1156   K 4.4 04/06/2023 1156   CL 102 04/06/2023 1156   CO2 21 04/06/2023 1156   GLUCOSE 142 (H) 04/06/2023 1156   GLUCOSE 237 (H) 03/13/2023 0858   BUN 15 04/06/2023 1156   CREATININE 1.34 (H) 04/06/2023 1156   CREATININE 0.93 08/01/2015 1202   CALCIUM 9.8 04/06/2023 1156   GFRNONAA 45 (L) 03/13/2023 0858   GFRAA 68 04/10/2020 1634    BNP    Component Value Date/Time   BNP 25.2 03/12/2023 0059    ProBNP    Component Value Date/Time   PROBNP 9.0 01/28/2023 1533    Imaging: NM PET CT CARDIAC PERFUSION MULTI W/ABSOLUTE BLOODFLOW  Result Date: 06/16/2023   The study is normal. The study is low risk.   LV perfusion is normal.   Rest left ventricular function is normal. Rest EF: 62%. Stress left ventricular function is normal. Stress EF: 58%. End diastolic cavity size is normal. End systolic cavity size is normal.   Myocardial blood flow was computed to be 1.58ml/g/min at rest and 2.61ml/g/min at stress. Global myocardial blood flow reserve was 2.24 and was normal.   Coronary calcium was absent on the attenuation correction CT images. Minimal aortic valve calcification.   Electronically Signed  By: Riley Lam M.D. EXAM: OVER-READ INTERPRETATION CARDIAC CT CHEST The following report is an over-read performed by radiologist Dr. Gaylyn Rong of [facility] on 06/16/2023. This over-read does not include interpretation of cardiac or coronary anatomy or pathology. The cardiac and interpretation by the cardiologist is attached or will be attached. COMPARISON:  03/10/2023 FINDINGS: Extracardiac Vascular: Unremarkable Mediastinum: Stable right upper paratracheal fluid density collection, probably fluid in the superior pericardial recess as  described on multiple prior exams. Lung: Airway thickening is present, suggesting bronchitis or reactive airways disease. Included Upper Abdomen: Steatosis of the visualized part of the liver. Musculoskeletal: Thoracic spondylosis. IMPRESSION: 1. Airway thickening is present, suggesting bronchitis or reactive airways disease. 2. Steatosis of the visualized part of the liver. 3. Thoracic spondylosis. Electronically Signed   By: Gaylyn Rong M.D.   On: 06/16/2023 08:58    Assessment & Plan:   Chronic respiratory failure with hypoxia (HCC) - Wears 3L oxygen at bedtime - Recommend getting repeat ONO on oxygen to re-assess hypoxemia  OSA (obstructive sleep apnea) - Hx sleep apnea. She has a tracheostomy. Having trouble fallings and staying asleep. She reports waking up feeling short of breath and gasping/choking sensation. She is unable to cap trach for in-lab sleep study. We will check ONO on nocturnal oxygen. Consider getting ABGs to assess for hypercarbia. May need NIV. Recommend getting wedge pillow to elevated HOB 30 degrees. She should follow up with Dr. Wynona Neat in November.   Tracheostomy dependent (HCC) - Following with ENT   Severe persistent asthma without complication -  Currently stable; No acute respiratory symptoms. She has a chronic cough. Maintained on Dulera and Singulair. Following with Dr. Alvis Lemmings.      Glenford Bayley, NP 06/22/2023

## 2023-06-22 NOTE — Assessment & Plan Note (Signed)
-   Wears 3L oxygen at bedtime - Recommend getting repeat ONO on oxygen to re-assess hypoxemia

## 2023-06-22 NOTE — Assessment & Plan Note (Addendum)
-   Hx sleep apnea. She has a tracheostomy. Having trouble fallings and staying asleep. She reports waking up feeling short of breath and gasping/choking sensation. She is unable to cap trach for in-lab sleep study. We will check ONO on nocturnal oxygen. Consider getting ABGs to assess for hypercarbia. May need NIV. Recommend getting wedge pillow to elevated HOB 30 degrees. She should follow up with Dr. Wynona Neat in November.

## 2023-06-22 NOTE — Assessment & Plan Note (Signed)
Following with ENT. 

## 2023-06-23 DIAGNOSIS — Z79899 Other long term (current) drug therapy: Secondary | ICD-10-CM | POA: Diagnosis not present

## 2023-06-24 ENCOUNTER — Other Ambulatory Visit (HOSPITAL_COMMUNITY): Payer: Self-pay

## 2023-06-28 ENCOUNTER — Encounter (HOSPITAL_COMMUNITY): Payer: Self-pay | Admitting: Emergency Medicine

## 2023-06-28 ENCOUNTER — Other Ambulatory Visit: Payer: Self-pay

## 2023-06-28 ENCOUNTER — Inpatient Hospital Stay (HOSPITAL_COMMUNITY)
Admission: EM | Admit: 2023-06-28 | Discharge: 2023-07-02 | DRG: 683 | Disposition: A | Discharge status: Home / Self Care | Payer: 59 | Attending: Internal Medicine | Admitting: Internal Medicine

## 2023-06-28 ENCOUNTER — Emergency Department (HOSPITAL_COMMUNITY): Payer: 59

## 2023-06-28 DIAGNOSIS — K029 Dental caries, unspecified: Secondary | ICD-10-CM | POA: Diagnosis not present

## 2023-06-28 DIAGNOSIS — F32A Depression, unspecified: Secondary | ICD-10-CM | POA: Diagnosis present

## 2023-06-28 DIAGNOSIS — N1831 Chronic kidney disease, stage 3a: Secondary | ICD-10-CM | POA: Diagnosis present

## 2023-06-28 DIAGNOSIS — I130 Hypertensive heart and chronic kidney disease with heart failure and stage 1 through stage 4 chronic kidney disease, or unspecified chronic kidney disease: Secondary | ICD-10-CM | POA: Diagnosis present

## 2023-06-28 DIAGNOSIS — K3184 Gastroparesis: Secondary | ICD-10-CM | POA: Diagnosis present

## 2023-06-28 DIAGNOSIS — J4550 Severe persistent asthma, uncomplicated: Secondary | ICD-10-CM | POA: Diagnosis present

## 2023-06-28 DIAGNOSIS — Z825 Family history of asthma and other chronic lower respiratory diseases: Secondary | ICD-10-CM

## 2023-06-28 DIAGNOSIS — Z7985 Long-term (current) use of injectable non-insulin antidiabetic drugs: Secondary | ICD-10-CM | POA: Diagnosis not present

## 2023-06-28 DIAGNOSIS — Z87891 Personal history of nicotine dependence: Secondary | ICD-10-CM | POA: Diagnosis not present

## 2023-06-28 DIAGNOSIS — R112 Nausea with vomiting, unspecified: Secondary | ICD-10-CM | POA: Diagnosis not present

## 2023-06-28 DIAGNOSIS — E1165 Type 2 diabetes mellitus with hyperglycemia: Secondary | ICD-10-CM

## 2023-06-28 DIAGNOSIS — Z79899 Other long term (current) drug therapy: Secondary | ICD-10-CM

## 2023-06-28 DIAGNOSIS — E1043 Type 1 diabetes mellitus with diabetic autonomic (poly)neuropathy: Secondary | ICD-10-CM | POA: Diagnosis present

## 2023-06-28 DIAGNOSIS — G43909 Migraine, unspecified, not intractable, without status migrainosus: Secondary | ICD-10-CM | POA: Diagnosis present

## 2023-06-28 DIAGNOSIS — R1010 Upper abdominal pain, unspecified: Secondary | ICD-10-CM | POA: Diagnosis not present

## 2023-06-28 DIAGNOSIS — R070 Pain in throat: Secondary | ICD-10-CM | POA: Diagnosis not present

## 2023-06-28 DIAGNOSIS — N179 Acute kidney failure, unspecified: Secondary | ICD-10-CM | POA: Diagnosis not present

## 2023-06-28 DIAGNOSIS — R221 Localized swelling, mass and lump, neck: Secondary | ICD-10-CM | POA: Diagnosis not present

## 2023-06-28 DIAGNOSIS — Z8261 Family history of arthritis: Secondary | ICD-10-CM

## 2023-06-28 DIAGNOSIS — E669 Obesity, unspecified: Secondary | ICD-10-CM | POA: Diagnosis present

## 2023-06-28 DIAGNOSIS — E785 Hyperlipidemia, unspecified: Secondary | ICD-10-CM | POA: Diagnosis not present

## 2023-06-28 DIAGNOSIS — Z7951 Long term (current) use of inhaled steroids: Secondary | ICD-10-CM | POA: Diagnosis not present

## 2023-06-28 DIAGNOSIS — Z823 Family history of stroke: Secondary | ICD-10-CM

## 2023-06-28 DIAGNOSIS — G4733 Obstructive sleep apnea (adult) (pediatric): Secondary | ICD-10-CM | POA: Diagnosis present

## 2023-06-28 DIAGNOSIS — K2100 Gastro-esophageal reflux disease with esophagitis, without bleeding: Secondary | ICD-10-CM | POA: Diagnosis not present

## 2023-06-28 DIAGNOSIS — Z794 Long term (current) use of insulin: Secondary | ICD-10-CM

## 2023-06-28 DIAGNOSIS — E1022 Type 1 diabetes mellitus with diabetic chronic kidney disease: Secondary | ICD-10-CM | POA: Diagnosis present

## 2023-06-28 DIAGNOSIS — K224 Dyskinesia of esophagus: Secondary | ICD-10-CM | POA: Diagnosis not present

## 2023-06-28 DIAGNOSIS — J9611 Chronic respiratory failure with hypoxia: Secondary | ICD-10-CM | POA: Diagnosis present

## 2023-06-28 DIAGNOSIS — Z833 Family history of diabetes mellitus: Secondary | ICD-10-CM

## 2023-06-28 DIAGNOSIS — Z888 Allergy status to other drugs, medicaments and biological substances status: Secondary | ICD-10-CM

## 2023-06-28 DIAGNOSIS — J441 Chronic obstructive pulmonary disease with (acute) exacerbation: Secondary | ICD-10-CM | POA: Diagnosis not present

## 2023-06-28 DIAGNOSIS — Z79891 Long term (current) use of opiate analgesic: Secondary | ICD-10-CM

## 2023-06-28 DIAGNOSIS — M542 Cervicalgia: Secondary | ICD-10-CM | POA: Diagnosis present

## 2023-06-28 DIAGNOSIS — G8929 Other chronic pain: Secondary | ICD-10-CM | POA: Diagnosis present

## 2023-06-28 DIAGNOSIS — I5032 Chronic diastolic (congestive) heart failure: Secondary | ICD-10-CM | POA: Diagnosis not present

## 2023-06-28 DIAGNOSIS — Z8249 Family history of ischemic heart disease and other diseases of the circulatory system: Secondary | ICD-10-CM | POA: Diagnosis not present

## 2023-06-28 DIAGNOSIS — E1069 Type 1 diabetes mellitus with other specified complication: Secondary | ICD-10-CM

## 2023-06-28 DIAGNOSIS — Z930 Tracheostomy status: Secondary | ICD-10-CM

## 2023-06-28 LAB — BASIC METABOLIC PANEL
Anion gap: 8 (ref 5–15)
BUN: 22 mg/dL — ABNORMAL HIGH (ref 6–20)
CO2: 26 mmol/L (ref 22–32)
Calcium: 9.1 mg/dL (ref 8.9–10.3)
Chloride: 104 mmol/L (ref 98–111)
Creatinine, Ser: 2.28 mg/dL — ABNORMAL HIGH (ref 0.44–1.00)
GFR, Estimated: 25 mL/min — ABNORMAL LOW (ref >60)
Glucose, Bld: 204 mg/dL — ABNORMAL HIGH (ref 70–99)
Potassium: 4.3 mmol/L (ref 3.5–5.1)
Sodium: 138 mmol/L (ref 135–145)

## 2023-06-28 LAB — GLUCOSE, CAPILLARY
Glucose-Capillary: 115 mg/dL — ABNORMAL HIGH (ref 70–99)
Glucose-Capillary: 134 mg/dL — ABNORMAL HIGH (ref 70–99)

## 2023-06-28 LAB — CBC WITH DIFFERENTIAL/PLATELET
Abs Immature Granulocytes: 0.02 10*3/uL (ref 0.00–0.07)
Basophils Absolute: 0 10*3/uL (ref 0.0–0.1)
Basophils Relative: 0 %
Eosinophils Absolute: 0.1 10*3/uL (ref 0.0–0.5)
Eosinophils Relative: 1 %
HCT: 37.7 % (ref 36.0–46.0)
Hemoglobin: 12 g/dL (ref 12.0–15.0)
Immature Granulocytes: 0 %
Lymphocytes Relative: 40 %
Lymphs Abs: 3.1 10*3/uL (ref 0.7–4.0)
MCH: 28.7 pg (ref 26.0–34.0)
MCHC: 31.8 g/dL (ref 30.0–36.0)
MCV: 90.2 fL (ref 80.0–100.0)
Monocytes Absolute: 0.7 10*3/uL (ref 0.1–1.0)
Monocytes Relative: 9 %
Neutro Abs: 3.9 10*3/uL (ref 1.7–7.7)
Neutrophils Relative %: 50 %
Platelets: 353 10*3/uL (ref 150–400)
RBC: 4.18 MIL/uL (ref 3.87–5.11)
RDW: 14.3 % (ref 11.5–15.5)
WBC: 7.9 10*3/uL (ref 4.0–10.5)
nRBC: 0 % (ref 0.0–0.2)

## 2023-06-28 LAB — HEMOGLOBIN A1C
Hgb A1c MFr Bld: 8.9 % — ABNORMAL HIGH (ref 4.8–5.6)
Mean Plasma Glucose: 208.73 mg/dL

## 2023-06-28 LAB — GROUP A STREP BY PCR: Group A Strep by PCR: NOT DETECTED

## 2023-06-28 MED ORDER — LACTATED RINGERS IV SOLN
INTRAVENOUS | Status: AC
  Administered 2023-06-28: 999 mL via INTRAVENOUS

## 2023-06-28 MED ORDER — FENTANYL CITRATE PF 50 MCG/ML IJ SOSY
25.0000 ug | PREFILLED_SYRINGE | INTRAMUSCULAR | Status: DC | PRN
  Administered 2023-06-29 – 2023-06-30 (×5): 25 ug via INTRAVENOUS
  Filled 2023-06-28 (×5): qty 1

## 2023-06-28 MED ORDER — MOMETASONE FURO-FORMOTEROL FUM 200-5 MCG/ACT IN AERO
2.0000 | INHALATION_SPRAY | Freq: Two times a day (BID) | RESPIRATORY_TRACT | Status: DC
  Administered 2023-06-28 – 2023-07-02 (×7): 2 via RESPIRATORY_TRACT
  Filled 2023-06-28: qty 8.8

## 2023-06-28 MED ORDER — LIDOCAINE VISCOUS HCL 2 % MT SOLN
15.0000 mL | Freq: Once | OROMUCOSAL | Status: AC
  Administered 2023-06-28: 15 mL via OROMUCOSAL
  Filled 2023-06-28: qty 15

## 2023-06-28 MED ORDER — SENNOSIDES-DOCUSATE SODIUM 8.6-50 MG PO TABS: 1.0000 | ORAL_TABLET | Freq: Every evening | ORAL | Status: DC | PRN

## 2023-06-28 MED ORDER — SERTRALINE HCL 100 MG PO TABS
200.0000 mg | ORAL_TABLET | Freq: Every day | ORAL | Status: DC
  Administered 2023-06-28 – 2023-07-01 (×4): 200 mg via ORAL
  Filled 2023-06-28 (×4): qty 2

## 2023-06-28 MED ORDER — MONTELUKAST SODIUM 10 MG PO TABS
10.0000 mg | ORAL_TABLET | Freq: Every day | ORAL | Status: DC
  Administered 2023-06-28 – 2023-07-01 (×4): 10 mg via ORAL
  Filled 2023-06-28 (×4): qty 1

## 2023-06-28 MED ORDER — FENTANYL CITRATE PF 50 MCG/ML IJ SOSY
25.0000 ug | PREFILLED_SYRINGE | Freq: Once | INTRAMUSCULAR | Status: AC
  Administered 2023-06-28: 25 ug via INTRAVENOUS
  Filled 2023-06-28: qty 1

## 2023-06-28 MED ORDER — ACETAMINOPHEN 650 MG RE SUPP: 650.0000 mg | Freq: Four times a day (QID) | RECTAL | Status: DC | PRN

## 2023-06-28 MED ORDER — LACTATED RINGERS IV BOLUS
500.0000 mL | Freq: Once | INTRAVENOUS | Status: AC
  Administered 2023-06-28: 500 mL via INTRAVENOUS

## 2023-06-28 MED ORDER — OXYCODONE HCL 5 MG PO TABS
5.0000 mg | ORAL_TABLET | Freq: Three times a day (TID) | ORAL | Status: DC | PRN
  Administered 2023-06-29 – 2023-07-01 (×5): 5 mg via ORAL
  Filled 2023-06-28 (×5): qty 1

## 2023-06-28 MED ORDER — LIDOCAINE VISCOUS HCL 2 % MT SOLN: 15.0000 mL | Freq: Four times a day (QID) | OROMUCOSAL | Status: DC | PRN

## 2023-06-28 MED ORDER — ONDANSETRON HCL 4 MG/2ML IJ SOLN
4.0000 mg | Freq: Once | INTRAMUSCULAR | Status: AC
  Administered 2023-06-28: 4 mg via INTRAVENOUS
  Filled 2023-06-28: qty 2

## 2023-06-28 MED ORDER — MORPHINE SULFATE (PF) 4 MG/ML IV SOLN
4.0000 mg | Freq: Once | INTRAVENOUS | Status: AC
  Administered 2023-06-28: 4 mg via INTRAVENOUS
  Filled 2023-06-28: qty 1

## 2023-06-28 MED ORDER — OXYCODONE-ACETAMINOPHEN 5-325 MG PO TABS
1.0000 | ORAL_TABLET | Freq: Three times a day (TID) | ORAL | Status: DC | PRN
  Administered 2023-06-29 – 2023-07-01 (×6): 1 via ORAL
  Filled 2023-06-28 (×6): qty 1

## 2023-06-28 MED ORDER — BUPROPION HCL ER (SR) 150 MG PO TB12
150.0000 mg | ORAL_TABLET | Freq: Two times a day (BID) | ORAL | Status: DC
  Administered 2023-06-28 – 2023-07-02 (×8): 150 mg via ORAL
  Filled 2023-06-28 (×10): qty 1

## 2023-06-28 MED ORDER — LACTATED RINGERS IV BOLUS
1000.0000 mL | Freq: Once | INTRAVENOUS | Status: AC
  Administered 2023-06-28: 1000 mL via INTRAVENOUS

## 2023-06-28 MED ORDER — ACETAMINOPHEN 325 MG PO TABS
650.0000 mg | ORAL_TABLET | Freq: Four times a day (QID) | ORAL | Status: DC | PRN
  Administered 2023-06-30: 650 mg via ORAL
  Filled 2023-06-28 (×2): qty 2

## 2023-06-28 MED ORDER — PREGABALIN 25 MG PO CAPS
50.0000 mg | ORAL_CAPSULE | Freq: Three times a day (TID) | ORAL | Status: DC
  Administered 2023-06-28 – 2023-07-02 (×12): 50 mg via ORAL
  Filled 2023-06-28 (×12): qty 2

## 2023-06-28 MED ORDER — ONDANSETRON HCL 4 MG/2ML IJ SOLN
4.0000 mg | Freq: Four times a day (QID) | INTRAMUSCULAR | Status: AC | PRN
  Administered 2023-06-29 – 2023-06-30 (×4): 4 mg via INTRAVENOUS
  Filled 2023-06-28 (×3): qty 2

## 2023-06-28 MED ORDER — ENOXAPARIN SODIUM 60 MG/0.6ML IJ SOSY
55.0000 mg | PREFILLED_SYRINGE | INTRAMUSCULAR | Status: DC
  Administered 2023-06-28 – 2023-07-01 (×4): 55 mg via SUBCUTANEOUS
  Filled 2023-06-28 (×4): qty 0.6

## 2023-06-28 MED ORDER — OXYCODONE-ACETAMINOPHEN 10-325 MG PO TABS: 1.0000 | ORAL_TABLET | Freq: Three times a day (TID) | ORAL | Status: DC | PRN

## 2023-06-28 MED ORDER — INSULIN ASPART 100 UNIT/ML IJ SOLN
0.0000 [IU] | Freq: Three times a day (TID) | INTRAMUSCULAR | Status: DC
  Administered 2023-06-29: 3 [IU] via SUBCUTANEOUS
  Administered 2023-06-29: 5 [IU] via SUBCUTANEOUS
  Administered 2023-06-29: 3 [IU] via SUBCUTANEOUS
  Administered 2023-06-30: 5 [IU] via SUBCUTANEOUS
  Administered 2023-06-30: 3 [IU] via SUBCUTANEOUS

## 2023-06-28 NOTE — Progress Notes (Signed)
Called to the bedside by nursing staff.  The patient is vomiting and concerned about right-sided neck pain.  Per the husband at bedside the vomiting has been ongoing for around a month now, uncertain what the causes.  The neck pain is the problem for which this person is admitted, it is worse with vomiting and retching, improves when she does not vomiting or retching.  No abdominal pain.  Nonbloody emesis.  No diarrhea.  She is tachycardic, her lungs sound clear, her peripheral pulses are strong, her abdomen is nontender.  Neck feels soft.  Tracheostomy in place.  EKG interpreted at bedside shows sinus tachycardia but no changes to suggest cardiac ischemia.  She is treated with fentanyl, 25 mcg via IV.  500 mL LR bolus.  Zofran for nausea.  Her symptoms are under better control and there is no imminent life threat.  She is protecting her airway.  The neck pain seems to be related to the nausea, wonder if her retching over the course the last month is the cause.  We also reviewed her CT of her neck and it did not show any swelling or compression of her airway.  Marrianne Mood MD 06/28/2023, 8:23 PM

## 2023-06-28 NOTE — ED Notes (Signed)
ED TO INPATIENT HANDOFF REPORT  ED Nurse Name and Phone #: Molli Hazard [phone]  S Name/Age/Gender [name] 52 y.o. female Room/Bed: 021C/021C  Code Status   Code Status: Full Code  Home/SNF/Other Home Patient oriented to: self, place, time, and situation Is this baseline? Yes   Triage Complete: Triage complete  Chief Complaint AKI (acute kidney injury) [facility]) [N17.9]  Triage Note PT came in POV complaining of 9/10 neck swelling and pain that feels like it is in her face as well.  She also complains of pain at trach site beginning a couple of days ago.  States that it feels a little tight in her throat as well.    Allergies Allergies  Allergen Reactions   Reglan [Metoclopramide] Other (See Comments)    Panic attack    Level of Care/Admitting Diagnosis ED Disposition     ED Disposition  Admit   Condition  --   Comment  Hospital Area: [facility] [[id number]]  Level of Care: Med-Surg [16]  May place patient in observation at [facility] or [facility] if equivalent level of care is available:: No  Covid Evaluation: Asymptomatic - no recent exposure (last 10 days) testing not required  Diagnosis: AKI (acute kidney injury) [facility]) [[id number]]  Admitting Physician: Reymundo Poll [[id number]]  Attending Physician: Reymundo Poll [[id number]]          B Medical/Surgery History Past Medical History:  Diagnosis Date   Abnormal UGI series    Acute pain of right shoulder due to trauma 10/05/2018   Acute respiratory failure with hypoxia and hypercapnia (HCC) 10/23/2018   Allergy    Anemia    Anxiety    Arthritis    Asthma    Chest pain of uncertain etiology 03/22/2019   Chronic back pain    Chronic chest pain    resolved, no problems since 2019 per patient 07/27/19   Coffee ground vomiting 10/14/2021   COPD (chronic obstructive pulmonary disease) (HCC)    inhaler   Depression    DM (diabetes mellitus) (HCC)    INSULIN DEPENDENT - TYPE 1    Dyspnea    GERD (gastroesophageal reflux disease)    Headache(784.0)    History of hiatal hernia    Hyperlipidemia    no med, diet controlled   Hypertension    Hypokalemia    Klebsiella cystitis 03/14/2017   Last Assessment & Plan:   Formatting of this note might be different from the original.  - On Zosyn  - Urine culture: Klebsiella pneumoniae   Neuromuscular disorder (HCC)    Osteoarthritis    Oxygen deficiency    Pneumonia    Respiratory disease 05/2017   uses inhalers, neb tx prn, no oxygen   Rotator cuff tear 07/28/2019   Sleep apnea    does not use CPAP due to trach, uses 2L supplement oxygen at night   Status post tracheostomy (HCC) 03/14/2017   Last Assessment & Plan:  Formatting of this note might be different from the original. - VERY CRITICAL AIRWAY WITH EXTREME PRECUATION - On tach - ENT following along (please see above for current trach management plan)   Tracheostomy in place [facility]) 02/2017   Past Surgical History:  Procedure Laterality Date   ABDOMINAL HYSTERECTOMY  2009   APPENDECTOMY     BIOPSY  10/15/2021   Procedure: BIOPSY;  Surgeon: Tressia Danas, MD;  Location: [facility] ENDOSCOPY;  Service: Gastroenterology;;   CESAREAN SECTION     x 3  CHOLECYSTECTOMY N/A 03/02/2014   Procedure: LAPAROSCOPIC CHOLECYSTECTOMY;  Surgeon: Clovis Pu. Cornett, MD;  Location: [facility] OR;  Service: General;  Laterality: N/A;   ESOPHAGOGASTRODUODENOSCOPY (EGD) WITH PROPOFOL N/A 10/15/2021   Procedure: ESOPHAGOGASTRODUODENOSCOPY (EGD) WITH PROPOFOL;  Surgeon: Tressia Danas, MD;  Location: [facility] ENDOSCOPY;  Service: Gastroenterology;  Laterality: N/A;   HERNIA REPAIR     PANENDOSCOPY N/A 03/04/2017   Procedure: PANENDOSCOPY WITH POSSIBLE FOREIGN BODY REMOVAL;  Surgeon: Flo Shanks, MD;  Location: [facility] OR;  Service: ENT;  Laterality: N/A;   ROTATOR CUFF REPAIR Left    ROTATOR CUFF REPAIR Right 07/27/2019   SHOULDER ARTHROSCOPY WITH SUBACROMIAL DECOMPRESSION, ROTATOR CUFF REPAIR AND  BICEP TENDON REPAIR Right 07/28/2019   Procedure: RIGHT SHOULDER ARTHROSCOP, MINI OPEN ROTATOR CUFF TEAR REPAIR,  BICEPS TENODESIS, DISTAL CLAVICLE EXCISION;  Surgeon: Cammy Copa, MD;  Location: [facility] OR;  Service: Orthopedics;  Laterality: Right;   SPINE SURGERY     TOOTH EXTRACTION N/A 02/22/2021   Procedure: DENTAL RESTORATION/EXTRACTIONS;  Surgeon: Ocie Doyne, DMD;  Location: [facility] OR;  Service: Oral Surgery;  Laterality: N/A;   TRACHEOSTOMY  02/2017   TUBAL LIGATION     VESICOVAGINAL FISTULA CLOSURE W/ TAH  2009     A IV Location/Drains/Wounds Patient Lines/Drains/Airways Status     Active Line/Drains/Airways     Name Placement date Placement time Site Days   Peripheral IV 06/28/23 18 G Anterior;Proximal;Right Forearm 06/28/23  1034  Forearm  less than 1   Tracheostomy Shiley Flexible 4 mm Uncuffed 10/31/21  1857  4 mm  605   Tracheostomy Shiley Flexible 4 mm Uncuffed 08/18/22  1030  4 mm  314   Tracheostomy Shiley Flexible 4 mm Uncuffed --  --  4 mm  --            Intake/Output Last 24 hours  Intake/Output Summary (Last 24 hours) at 06/28/2023 1608 Last data filed at 06/28/2023 1415 Gross per 24 hour  Intake 1000 ml  Output --  Net 1000 ml    Labs/Imaging Results for orders placed or performed during the hospital encounter of 06/28/23 (from the past 48 hour(s))  Basic metabolic panel     Status: Abnormal   Collection Time: 06/28/23 10:35 AM  Result Value Ref Range   Sodium 138 135 - 145 mmol/L   Potassium 4.3 3.5 - 5.1 mmol/L   Chloride 104 98 - 111 mmol/L   CO2 26 22 - 32 mmol/L   Glucose, Bld 204 (H) 70 - 99 mg/dL    Comment: Glucose reference range applies only to samples taken after fasting for at least 8 hours.   BUN 22 (H) 6 - 20 mg/dL   Creatinine, Ser 6.21 (H) 0.44 - 1.00 mg/dL   Calcium 9.1 8.9 - 30.8 mg/dL   GFR, Estimated 25 (L) >60 mL/min    Comment: (NOTE) Calculated using the CKD-EPI Creatinine Equation (2021)    Anion gap 8 5 - 15     Comment: Performed at [facility], [address] [place], [place] [address]  CBC with Differential     Status: None   Collection Time: 06/28/23 10:35 AM  Result Value Ref Range   WBC 7.9 4.0 - 10.5 K/uL   RBC 4.18 3.87 - 5.11 MIL/uL   Hemoglobin 12.0 12.0 - 15.0 g/dL   HCT 69.6 29.5 - 28.4 %   MCV 90.2 80.0 - 100.0 fL   MCH 28.7 26.0 - 34.0 pg   MCHC 31.8 30.0 - 36.0  g/dL   RDW 29.5 62.1 - 30.8 %   Platelets 353 150 - 400 K/uL   nRBC 0.0 0.0 - 0.2 %   Neutrophils Relative % 50 %   Neutro Abs 3.9 1.7 - 7.7 K/uL   Lymphocytes Relative 40 %   Lymphs Abs 3.1 0.7 - 4.0 K/uL   Monocytes Relative 9 %   Monocytes Absolute 0.7 0.1 - 1.0 K/uL   Eosinophils Relative 1 %   Eosinophils Absolute 0.1 0.0 - 0.5 K/uL   Basophils Relative 0 %   Basophils Absolute 0.0 0.0 - 0.1 K/uL   Immature Granulocytes 0 %   Abs Immature Granulocytes 0.02 0.00 - 0.07 K/uL    Comment: Performed at [facility], [address] [place], [place] [address]  Group A Strep by PCR     Status: None   Collection Time: 06/28/23  1:37 PM   Specimen: Throat; Sterile Swab  Result Value Ref Range   Group A Strep by PCR NOT DETECTED NOT DETECTED    Comment: Performed at [facility], [address] [place], [place] [address]   *Note: Due to a large number of results and/or encounters for the requested time period, some results have not been displayed. A complete set of results can be found in Results Review.   CT Soft Tissue Neck Wo Contrast  Result Date: 06/28/2023 CLINICAL DATA:  Epiglottitis or tonsillitis suspected.  Neck pain EXAM: CT NECK WITHOUT CONTRAST TECHNIQUE: Multidetector CT imaging of the neck was performed following the standard protocol without intravenous contrast. RADIATION DOSE REDUCTION: This exam was performed according to the departmental dose-optimization program which includes automated exposure control, adjustment of the mA and/or kV according to patient size and/or use of  iterative reconstruction technique. COMPARISON:  06/16/2023 FINDINGS: Pharynx and larynx: Normal. No mass or swelling. Salivary glands: No inflammation, mass, or stone. Thyroid: Normal. Lymph nodes: None enlarged or abnormal density. Vascular: Negative. Limited intracranial: Negative Visualized orbits: Negative Mastoids and visualized paranasal sinuses: Clear. Skeleton: Unremarkable Upper chest: Tracheostomy tube that is located. No regional inflammation is seen. Other: IMPRESSION: No explanation for symptoms. No visible inflammation in the neck. Electronically Signed   By: Tiburcio Pea M.D.   On: 06/28/2023 12:36    Pending Labs Unresulted Labs (From admission, onward)     Start     Ordered   06/29/23 0500  Basic metabolic panel  Tomorrow morning,   R        06/28/23 1556   06/29/23 0500  CBC  Tomorrow morning,   R        06/28/23 1556   06/28/23 1555  HIV Antibody (routine testing w rflx)  (HIV Antibody (Routine testing w reflex) panel)  Add-on,   AD        06/28/23 1556            Vitals/Pain Today's Vitals   06/28/23 1534 06/28/23 1545 06/28/23 1600 06/28/23 1604  BP:  (!) 153/122 (!) 143/90   Pulse:    (!) 105  Resp:   14   Temp:      TempSrc:      SpO2:    99%  PainSc: 5        Isolation Precautions No active isolations  Medications Medications  enoxaparin (LOVENOX) injection 55 mg (has no administration in time range)  acetaminophen (TYLENOL) tablet 650 mg (has no administration in time range)    Or  acetaminophen (TYLENOL) suppository 650 mg (has  no administration in time range)  senna-docusate (Senokot-S) tablet 1 tablet (has no administration in time range)  lactated ringers infusion (has no administration in time range)  lidocaine (XYLOCAINE) 2 % viscous mouth solution 15 mL (has no administration in time range)  morphine (PF) 4 MG/ML injection 4 mg (4 mg Intravenous Given 06/28/23 1034)  lactated ringers bolus 1,000 mL (0 mLs Intravenous Stopped 06/28/23 1415)   lidocaine (XYLOCAINE) 2 % viscous mouth solution 15 mL (15 mLs Mouth/Throat Given 06/28/23 1337)  morphine (PF) 4 MG/ML injection 4 mg (4 mg Intravenous Given 06/28/23 1541)    Mobility walks     Focused Assessments Cardiac Assessment Handoff:    Lab Results  Component Value Date   CKTOTAL 282 (H) 04/06/2023   CKMB 1.5 05/08/2011   TROPONINI <0.03 10/23/2018   Lab Results  Component Value Date   DDIMER 1.24 (H) 02/10/2023   Does the Patient currently have chest pain? Yes  Anterior chest wall pain from coughing.  , Neuro Assessment Handoff:  Swallow screen pass?          Neuro Assessment:   Neuro Checks:      Has TPA been given?  If patient is a Neuro Trauma and patient is going to OR before floor call report to 4N Charge nurse: [phone] or [phone]  , Renal Assessment Handoff:  Hemodialysis Schedule:  Last Hemodialysis date and time:    Restricted appendage:   , Pulmonary Assessment Handoff:  Lung sounds: Bilateral Breath Sounds: Clear, Diminished O2 Device: Room Air      R Recommendations: See Admitting Provider Note  Report given to:   Additional Notes: Pain/swelling to neck/throat. Pt reports a "tightness" in throat. Pt has managed/suctioned/and replaced her inlet since I have had her. She has received morphine with no relief. Vitals are stable. Anterior chest pain present from coughing, but ECG taken.

## 2023-06-28 NOTE — ED Notes (Signed)
Patient denies wanting to get her inner cannula changed at this time with nurse and respiratory at bedside. She states that she just changed her inner cannula today and does not want to waste the new one. Trach supplies are at bedside.

## 2023-06-28 NOTE — Progress Notes (Addendum)
Patient requiring deep suctioning. Respiratory Therapy consulted,paged IMTS for reevaluation to bedside. Dr. Clyde Lundborg placed orders for Fentanyl, bolus  LR and EKG. EKG results showed NST.

## 2023-06-28 NOTE — H&P (Cosign Needed Addendum)
Date: 06/28/2023               Patient Name:  [name] MRN: [id number]  DOB: [birth year] Age / Sex: 52 y.o., female   PCP: Hoy Register, MD         Medical Service: Internal Medicine Teaching Service         Attending Physician: Dr. Reymundo Poll, MD      First Contact: Dr. Peterson Ao, MD Pager [phone]    Second Contact: Dr. Rana Snare, DO Pager [phone]         After Hours (After 5p/  First Contact Pager: [phone]  weekends / holidays): Second Contact Pager: [phone]   SUBJECTIVE   Chief Complaint: Neck pain and swelling   History of Present Illness:   [name] is a 52 y.o. female with a past medical history of hypertension, hyperlipidemia, chronic diastolic heart failure, OSA, chronic respiratory failure, severe persistent asthma s/p trach dependent, chronic pain, depression, anxiety and type 1 diabetes who presents for neck pain and swelling.   Patient reports intermittent right sided neck pain the past 1-2 weeks. Neck pain is chronic and is sharp around her trach. Patient noted significant swelling under her chin. Also reports difficulty swallowing, decreased appetite, oral intake, emesis last night and persistent nausea that past 1-2 weeks. No trismus and tolerating oral secretions. Pain is worse with cough. Took home Percocet with some relief.    Denies any direct trauma to area, injuries or recent dental procedures. Per EMR patient was evaluated August 2024 for a fractured tooth and gross tooth decay. At that time, they told her due to severe generalized dental anxiety and her diagnosis of tracheitis, she would not be a candidate for an IV sedation and extraction would need to be performed in the OR, with a referral placed. Denies any sick contacts, changes in diets, new perfumes, detergents or skin irritants. Denies Also endorses subjective fever and headache. Denies current chest pain or abdominal pain.   Patient has trach  care managed by [facility]. Also is able to perform some activities of daily living but requires assistant with a home aid a few times a week and with support of family. Hasn't taken her routine medicines today.  ED Course: Strep A negative. BMP w/ Cr 2.28. CBC WNL w/o leukocytosis. CT soft tissue neck w/o contrast no explanation for symptoms, no visible neck inflammation, masses swelling or stones reported on imaging. 1L LR bolus and given morphine IV and lidocaine viscous for pain control.   Past Medical History Past Medical History:  Diagnosis Date   Abnormal UGI series    Acute pain of right shoulder due to trauma 10/05/2018   Acute respiratory failure with hypoxia and hypercapnia (HCC) 10/23/2018   Allergy    Anemia    Anxiety    Arthritis    Asthma    Chest pain of uncertain etiology 03/22/2019   Chronic back pain    Chronic chest pain    resolved, no problems since 2019 per patient 07/27/19   Coffee ground vomiting 10/14/2021   COPD (chronic obstructive pulmonary disease) (HCC)    inhaler   Depression    DM (diabetes mellitus) (HCC)    INSULIN DEPENDENT - TYPE 1   Dyspnea    GERD (gastroesophageal reflux disease)    Headache(784.0)    History of hiatal hernia    Hyperlipidemia    no med, diet controlled   Hypertension    Hypokalemia  Klebsiella cystitis 03/14/2017   Last Assessment & Plan:   Formatting of this note might be different from the original.  - On Zosyn  - Urine culture: Klebsiella pneumoniae   Neuromuscular disorder (HCC)    Osteoarthritis    Oxygen deficiency    Pneumonia    Respiratory disease 05/2017   uses inhalers, neb tx prn, no oxygen   Rotator cuff tear 07/28/2019   Sleep apnea    does not use CPAP due to trach, uses 2L supplement oxygen at night   Status post tracheostomy (HCC) 03/14/2017   Last Assessment & Plan:  Formatting of this note might be different from the original. - VERY CRITICAL AIRWAY WITH EXTREME PRECUATION - On tach - ENT  following along (please see above for current trach management plan)   Tracheostomy in place [facility]) 02/2017     Meds:  No longer takes farxiga  Current Meds  Medication Sig   acetaminophen (TYLENOL) 500 MG tablet Take 1,000 mg by mouth 2 (two) times daily as needed for moderate pain, fever or headache.   albuterol (PROVENTIL) (2.5 MG/3ML) 0.083% nebulizer solution Take 3 mLs (2.5 mg total) by nebulization every 6 (six) hours as needed for wheezing or shortness of breath.   albuterol (VENTOLIN HFA) 108 (90 Base) MCG/ACT inhaler INHALE 2 PUFFS BY MOUTH EVERY 6 HOURS AS NEEDED FOR WHEEZING FOR SHORTNESS OF BREATH (Patient taking differently: Inhale 2 puffs into the lungs every 6 (six) hours as needed for shortness of breath or wheezing.)   amitriptyline (ELAVIL) 10 MG tablet Take 1 tablet (10 mg total) by mouth at bedtime.   amLODipine (NORVASC) 5 MG tablet Take 1 tablet (5 mg total) by mouth daily.   buPROPion (WELLBUTRIN SR) 150 MG 12 hr tablet Take 1 tablet (150 mg total) by mouth 2 (two) times daily.   cetirizine (ZYRTEC) 10 MG tablet TAKE 1 TABLET BY MOUTH AT BEDTIME   dapagliflozin propanediol (FARXIGA) 10 MG TABS tablet Take 1 tablet (10 mg total) by mouth daily before breakfast.   furosemide (LASIX) 40 MG tablet Take 1 tablet (40 mg total) by mouth daily as needed for fluid or edema.   insulin degludec (TRESIBA FLEXTOUCH) 200 UNIT/ML FlexTouch Pen Inject 170 Units into the skin daily.   insulin lispro (HUMALOG KWIKPEN) 100 UNIT/ML KwikPen Inject 10 Units into the skin 3 (three) times daily. (Patient taking differently: Inject 10 Units into the skin with breakfast, with lunch, and with evening meal.)   lisinopril (ZESTRIL) 20 MG tablet Take 1 tablet (20 mg total) by mouth daily.   melatonin 3 MG TABS tablet Take 1 tablet (3 mg total) by mouth at bedtime.   mometasone-formoterol (DULERA) 200-5 MCG/ACT AERO Inhale 2 puffs into the lungs in the morning and at bedtime.   montelukast (SINGULAIR)  10 MG tablet Take 1 tablet (10 mg total) by mouth at bedtime.   ondansetron (ZOFRAN) 4 MG tablet Take 1 tablet (4 mg total) by mouth every 8 (eight) hours as needed for nausea or vomiting.   oxyCODONE-acetaminophen (PERCOCET) 10-325 MG tablet Take 1 tablet by mouth 4 (four) times daily as needed for pain.   pantoprazole (PROTONIX) 40 MG tablet Take 1 tablet (40 mg total) by mouth 2 (two) times daily.   pregabalin (LYRICA) 50 MG capsule TAKE 1 CAPSULE BY MOUTH THREE TIMES DAILY IN  THE  MORNING,  AT  NOON,  AND  AT  BEDTIME (Patient taking differently: Take 50 mg by mouth 3 (three) times daily.)  rosuvastatin (CRESTOR) 20 MG tablet Take 1 tablet by mouth once daily   sertraline (ZOLOFT) 100 MG tablet Take 2 tablets (200 mg total) by mouth at bedtime.   tiZANidine (ZANAFLEX) 4 MG tablet Take 1 tablet (4 mg total) by mouth every 8 (eight) hours as needed for muscle spasms.    Past Surgical History  Past Surgical History:  Procedure Laterality Date   ABDOMINAL HYSTERECTOMY  2009   APPENDECTOMY     BIOPSY  10/15/2021   Procedure: BIOPSY;  Surgeon: Tressia Danas, MD;  Location[facility] ENDOSCOPY;  Service: Gastroenterology;;   CESAREAN SECTION     x 3   CHOLECYSTECTOMY N/A 03/02/2014   Procedure: LAPAROSCOPIC CHOLECYSTECTOMY;  Surgeon: Clovis Pu. Cornett, MD;  Location: [facility] OR;  Service: General;  Laterality: N/A;   ESOPHAGOGASTRODUODENOSCOPY (EGD) WITH PROPOFOL N/A 10/15/2021   Procedure: ESOPHAGOGASTRODUODENOSCOPY (EGD) WITH PROPOFOL;  Surgeon: Tressia Danas, MD;  Location: [facility] ENDOSCOPY;  Service: Gastroenterology;  Laterality: N/A;   HERNIA REPAIR     PANENDOSCOPY N/A 03/04/2017   Procedure: PANENDOSCOPY WITH POSSIBLE FOREIGN BODY REMOVAL;  Surgeon: Flo Shanks, MD;  Location: [facility] OR;  Service: ENT;  Laterality: N/A;   ROTATOR CUFF REPAIR Left    ROTATOR CUFF REPAIR Right 07/27/2019   SHOULDER ARTHROSCOPY WITH SUBACROMIAL DECOMPRESSION, ROTATOR CUFF REPAIR AND BICEP TENDON REPAIR Right  07/28/2019   Procedure: RIGHT SHOULDER ARTHROSCOP, MINI OPEN ROTATOR CUFF TEAR REPAIR,  BICEPS TENODESIS, DISTAL CLAVICLE EXCISION;  Surgeon: Cammy Copa, MD;  Location: [facility] OR;  Service: Orthopedics;  Laterality: Right;   SPINE SURGERY     TOOTH EXTRACTION N/A 02/22/2021   Procedure: DENTAL RESTORATION/EXTRACTIONS;  Surgeon: Ocie Doyne, DMD;  Location: [facility] OR;  Service: Oral Surgery;  Laterality: N/A;   TRACHEOSTOMY  02/2017   TUBAL LIGATION     VESICOVAGINAL FISTULA CLOSURE W/ TAH  2009    Social:  Lives With: husband and grandson  Occupation: not employed  Support: family  Level of Function: has Therapist, art to help with bathing, cleaning and cooking PCP: Hoy Register, MD Substances: -Tobacco: denies -Alcohol: drink wine occasionally  -Recreational Drug: denies  Family History: Family History  Problem Relation Age of Onset   Heart attack Mother    Stroke Mother    Diabetes Mother    Hypertension Mother    Arthritis Mother    Stroke Father    Asthma Sister    Hypertension Sister    Asthma Sister    Diabetes Sister    Asthma Brother    Seizures Brother    Asthma Brother    Diabetes Brother    Asthma Daughter    Asthma Son    Asthma Grandson    Colon cancer Neg Hx    Rectal cancer Neg Hx    Stomach cancer Neg Hx    Esophageal cancer Neg Hx    Ovarian cancer Neg Hx    Pancreatic cancer Neg Hx    Allergies: Allergies as of 06/28/2023 - Review Complete 06/28/2023  Allergen Reaction Noted   Reglan [metoclopramide] Other (See Comments) 06/04/2014    Review of Systems: A complete ROS was negative except as per HPI.   OBJECTIVE:   Physical Exam: Blood pressure (!) 143/90, pulse (!) 105, temperature 98.7 F (37.1 C), temperature source Oral, resp. rate 14, SpO2 99%. On room air   Physical Exam Constitutional:      Appearance: Normal appearance. She is obese. She is not diaphoretic.     Comments: Tearful due to pain  and discomfort   HENT:      Mouth/Throat:     Mouth: No angioedema.     Dentition: Abnormal dentition. Dental caries present. No dental abscesses.     Palate: No mass and lesions.     Pharynx: Oropharynx is clear. No oropharyngeal exudate or posterior oropharyngeal erythema.     Tonsils: No tonsillar abscesses.      Comments: Fractured tooth #32, w/ caries Eyes:     Conjunctiva/sclera: Conjunctivae normal.  Neck:     Trachea: Tracheostomy present.     Comments: ROM limited due to pain, mild swelling on the right side, not hot to the touch, neck appears symmetrical, tracheostomy in place since 2018  Cardiovascular:     Rate and Rhythm: Normal rate and regular rhythm.  Pulmonary:     Effort: Pulmonary effort is normal. No respiratory distress.     Breath sounds: No wheezing.  Abdominal:     General: Bowel sounds are normal. There is no distension.     Palpations: Abdomen is soft.     Tenderness: There is no abdominal tenderness.  Musculoskeletal:     Cervical back: Neck supple. Tenderness present. No rigidity. Pain with movement present. Decreased range of motion.  Lymphadenopathy:     Cervical: No cervical adenopathy.  Skin:    General: Skin is warm and dry.     Coloration: Skin is not jaundiced.     Findings: No erythema.  Neurological:     General: No focal deficit present.     Mental Status: She is alert.  Psychiatric:        Mood and Affect: Mood normal.        Behavior: Behavior normal.    Labs: CBC    Component Value Date/Time   WBC 7.9 06/28/2023 1035   RBC 4.18 06/28/2023 1035   HGB 12.0 06/28/2023 1035   HGB 11.9 04/12/2018 1035   HCT 37.7 06/28/2023 1035   HCT 34.8 04/12/2018 1035   PLT 353 06/28/2023 1035   PLT 373 04/12/2018 1035   MCV 90.2 06/28/2023 1035   MCV 84 04/12/2018 1035   MCH 28.7 06/28/2023 1035   MCHC 31.8 06/28/2023 1035   RDW 14.3 06/28/2023 1035   RDW 14.6 04/12/2018 1035   LYMPHSABS 3.1 06/28/2023 1035   LYMPHSABS 4.0 (H) 04/12/2018 1035   MONOABS 0.7  06/28/2023 1035   EOSABS 0.1 06/28/2023 1035   EOSABS 0.0 04/12/2018 1035   BASOSABS 0.0 06/28/2023 1035   BASOSABS 0.0 04/12/2018 1035     CMP     Component Value Date/Time   NA 138 06/28/2023 1035   NA 142 04/06/2023 1156   K 4.3 06/28/2023 1035   CL 104 06/28/2023 1035   CO2 26 06/28/2023 1035   GLUCOSE 204 (H) 06/28/2023 1035   BUN 22 (H) 06/28/2023 1035   BUN 15 04/06/2023 1156   CREATININE 2.28 (H) 06/28/2023 1035   CREATININE 0.93 08/01/2015 1202   CALCIUM 9.1 06/28/2023 1035   PROT 7.8 04/06/2023 1156   ALBUMIN 4.5 04/06/2023 1156   AST 15 04/06/2023 1156   ALT 17 04/06/2023 1156   ALKPHOS 143 (H) 04/06/2023 1156   BILITOT <0.2 04/06/2023 1156   GFRNONAA 25 (L) 06/28/2023 1035   GFRAA 68 04/10/2020 1634    Imaging:  CT Soft Tissue Neck Wo Contrast CLINICAL DATA:  Epiglottitis or tonsillitis suspected.  Neck pain  EXAM: CT NECK WITHOUT CONTRAST  TECHNIQUE: Multidetector CT imaging of the neck was performed following  the standard protocol without intravenous contrast.  RADIATION DOSE REDUCTION: This exam was performed according to the departmental dose-optimization program which includes automated exposure control, adjustment of the mA and/or kV according to patient size and/or use of iterative reconstruction technique.  COMPARISON:  06/16/2023  FINDINGS: Pharynx and larynx: Normal. No mass or swelling.  Salivary glands: No inflammation, mass, or stone.  Thyroid: Normal.  Lymph nodes: None enlarged or abnormal density.  Vascular: Negative.  Limited intracranial: Negative  Visualized orbits: Negative  Mastoids and visualized paranasal sinuses: Clear.  Skeleton: Unremarkable  Upper chest: Tracheostomy tube that is located. No regional inflammation is seen.  Other:  IMPRESSION: No explanation for symptoms. No visible inflammation in the neck.  Electronically Signed   By: Tiburcio Pea M.D.   On: 06/28/2023 12:36  EKG: pending     ASSESSMENT & PLAN:   Assessment & Plan by Problem: Principal Problem:   AKI (acute kidney injury) (HCC)   [name] is a 52 y.o. person living with a history of a past medical history of hypertension, hyperlipidemia, chronic diastolic heart failure, OSA, chronic respiratory failure, severe persistent asthma s/p trach dependent, and DM who presented with right sided neck pain and swelling and admitted for acute kidney injury on hospital day 0.   Right sided neck pain and swelling  Neck pain ongoing for 1-2 weeks, with limited PO intake. Swelling worsened this morning. No surrounding erythema, drainage, sign of infection or Sialorrhea noted on exam. CT imaging without any signs of inflammation, abscess or fracture. Low suspicion for Ludwigs angina given objective CT and physical exam presentation. Mild relief on home percocet. Given morphine and lidocaine viscous in the ED. Pain could be secondary to decaying tooth and fracture on the right side. Was evaluated August 2024 and told she'd need sedation in the OR for dental procedure.   - Tylenol 650 mg q6h prn   - Cold and Heat compresses prn  - consider IV tylenol if unable to take pain medicines via PO  - Patient needs outpatient follow-up with dentistry   Acute Kidney Injury  Patients Cr elevated at 2.28 and BUN of 22, suspect injury pre-renal given limited PO intake due to neck pain, nausea and emesis that past couple weeks. Will continue with fluid resuscitation.  - s/p 1 L LR bolus in the ED  - Continuous LR 100 cc/hr  - Carb modified diet and encourage PO intake  - Avoid nephrotoxic agents  - Followup AM BMP   Chronic Medical Conditions: Chronic pain: Home regimen of pregabalin 50 mg TID, percocet 10-325 QID prn, tizanadine 4 mg q8h prn and tylenol 500 mg BID prn  Continue pregabalin 50 mg TID  Asthma s/p complicated intubation and now trach dependent since 2018 Home regimen: Duelra 200-5  2 puff inhalation,  BID and singulair 10 mg at bedtime  Continue home regimen   Diabetes Mellitus  Most recent A1c June 2024 was 10.3 >>> 8.3 on admission, home regimen Tresiba 170 units, Humalog 10units TID w/ meals, Mounjaro 12.5 mg once weekly  - Hold home regmen given poor PO intake  - Moderate SSI  - BG checks w/ meals and at bedtime   Hypertension  Blood pressure ranging between 90-140s/50-90 diastolic's.  Home regimen: lisinopril 20 mg daily and amlodipine 5 mg at bedtime  - Holding home meds  Depression, Anxiety  Home regimen bupropion 150 mg BID and sertraline 100 mg 2 tabs at night - Continue home meds  Migraines:  Home regimen: amitriptyline 10 mg at bedtime, patient unsure if currently taking  Holding home med   Diet:  Carb modified diet  VTE: Enoxaparin IVF: LR,100cc/hr Code: Full  Prior to Admission Living Arrangement: Home, living with husband Anticipated Discharge Location:  TBD Barriers to Discharge: Resolution of kidney injury and optimized pain control   Dispo: Admit patient to Observation with expected length of stay less than 2 midnights.  Signed: Peterson Ao, MD PGY-1 Psychiatry Resident Contact: secure message or page [phone] Please contact the on call pager after 5 pm and on weekends at [phone]. 06/28/2023, 4:13 PM

## 2023-06-28 NOTE — ED Triage Notes (Addendum)
PT came in POV complaining of 9/10 neck swelling and pain that feels like it is in her face as well.  She also complains of pain at trach site beginning a couple of days ago.  States that it feels a little tight in her throat as well.

## 2023-06-28 NOTE — ED Provider Notes (Signed)
[facility] [facility] DE[facility]MENT AT [facility] Provider Note   CSN: [id number] Arrival date & time: 06/28/23  0920     History  Chief Complaint  Patient presents with   trach swelling     Rhodesia Stanger [name] is a 52 y.o. female.  Patient is a 52 year old female with a past medical history of asthma, trach dependent due to traumatic intubation, hypertension, diabetes, CHF presenting to the emergency department with neck pain and swelling.  Patient states that she has had intermittent right sided neck pain that comes and goes "for a while".  She reports that this morning when she woke up however she had significant neck swelling, under her chin and on the right side of her neck.  She denies any fevers or shortness of breath.  She denies any dental pain or swelling.  She has had her trach in place since 2018.  The history is provided by the patient and the spouse.       Home Medications Prior to Admission medications   Medication Sig Start Date End Date Taking? Authorizing Provider  acetaminophen (TYLENOL) 500 MG tablet Take 1,000 mg by mouth 2 (two) times daily as needed for moderate pain, fever or headache.    [provider]  albuterol (PROVENTIL) (2.5 MG/3ML) 0.083% nebulizer solution Take 3 mLs (2.5 mg total) by nebulization every 6 (six) hours as needed for wheezing or shortness of breath. 12/26/22   Hoy Register, MD  albuterol (VENTOLIN HFA) 108 (90 Base) MCG/ACT inhaler INHALE 2 PUFFS BY MOUTH EVERY 6 HOURS AS NEEDED FOR WHEEZING FOR SHORTNESS OF BREATH Patient taking differently: Inhale 2 puffs into the lungs every 6 (six) hours as needed for shortness of breath or wheezing. 02/03/23   Hoy Register, MD  amitriptyline (ELAVIL) 10 MG tablet Take 1 tablet (10 mg total) by mouth at bedtime. 06/16/23   Storm Frisk, MD  amLODipine (NORVASC) 5 MG tablet Take 1 tablet (5 mg total) by mouth daily. 01/22/23 04/22/23  Meriam Sprague, MD  Blood  Glucose Monitoring Suppl (ONETOUCH VERIO FLEX SYSTEM) w/Device KIT Use to check glucose levels daily 06/05/23   Hoy Register, MD  buPROPion (WELLBUTRIN SR) 150 MG 12 hr tablet Take 1 tablet (150 mg total) by mouth 2 (two) times daily. 01/15/23   Hoy Register, MD  cetirizine (ZYRTEC) 10 MG tablet TAKE 1 TABLET BY MOUTH AT BEDTIME 12/10/21   Charlott Holler, MD  Continuous Glucose Receiver (FREESTYLE LIBRE 2 READER) DEVI USE TO CHECK BLOOD SUGAR THREE TIMES DAILY. 06/01/23   Hoy Register, MD  Continuous Glucose Sensor (FREESTYLE LIBRE 2 SENSOR) MISC USE TO CHECK BLOOD GLUCOSE THREE TIMES DAILY.  CHANGE SENSOR ONCE EVERY 14 DAYS. 06/10/23   Hoy Register, MD  dapagliflozin propanediol (FARXIGA) 10 MG TABS tablet Take 1 tablet (10 mg total) by mouth daily before breakfast. 01/15/23   Hoy Register, MD  fluconazole (DIFLUCAN) 150 MG tablet TAKE ONE TABLET BY MOUTH AS A ONE-TIME DOSE 04/07/23   Hoy Register, MD  furosemide (LASIX) 40 MG tablet Take 1 tablet (40 mg total) by mouth daily as needed for fluid or edema. 03/12/23   Almon Hercules, MD  glucose blood (ONETOUCH VERIO) test strip Use to check glucose levels daily; E11.9 01/09/23   Hoy Register, MD  insulin degludec (TRESIBA FLEXTOUCH) 200 UNIT/ML FlexTouch Pen Inject 170 Units into the skin daily. 03/10/23   Hoy Register, MD  insulin lispro (HUMALOG KWIKPEN) 100 UNIT/ML KwikPen Inject 10 Units into  the skin 3 (three) times daily. Patient taking differently: Inject 10 Units into the skin with breakfast, with lunch, and with evening meal. 01/15/23   Hoy Register, MD  Insulin Pen Needle 31G X 8 MM MISC Use to inject [place] and Humalog. Pt may use 4 pen needles daily. 11/07/22   Hoy Register, MD  lisinopril (ZESTRIL) 20 MG tablet Take 1 tablet (20 mg total) by mouth daily. 03/19/23   Almon Hercules, MD  melatonin 3 MG TABS tablet Take 1 tablet (3 mg total) by mouth at bedtime. 06/20/22   Hoy Register, MD  Misc. Devices MISC Shower chair  04/03/21   Hoy Register, MD  Misc. Devices MISC Nebulizer device. Diagnosis - Asthma 04/06/23   Hoy Register, MD  mometasone-formoterol (DULERA) 200-5 MCG/ACT AERO Inhale 2 puffs into the lungs in the morning and at bedtime. 01/15/23   Hoy Register, MD  montelukast (SINGULAIR) 10 MG tablet Take 1 tablet (10 mg total) by mouth at bedtime. 01/15/23   Hoy Register, MD  ondansetron (ZOFRAN) 4 MG tablet Take 1 tablet (4 mg total) by mouth every 8 (eight) hours as needed for nausea or vomiting. 06/11/23   Hoy Register, MD  OneTouch Delica Lancets 33G MISC Use to check glucose levels daily; E11.9 01/09/23   Hoy Register, MD  oxyCODONE-acetaminophen (PERCOCET) 10-325 MG tablet Take 1 tablet by mouth 4 (four) times daily as needed for pain. 11/22/21   [provider]  pantoprazole (PROTONIX) 40 MG tablet Take 1 tablet (40 mg total) by mouth 2 (two) times daily. 01/15/23   Hoy Register, MD  pregabalin (LYRICA) 50 MG capsule TAKE 1 CAPSULE BY MOUTH THREE TIMES DAILY IN  THE  MORNING,  AT  NOON,  AND  AT  BEDTIME 04/27/23   London Sheer, MD  rosuvastatin (CRESTOR) 20 MG tablet Take 1 tablet by mouth once daily 04/27/23   Hoy Register, MD  sertraline (ZOLOFT) 100 MG tablet Take 2 tablets (200 mg total) by mouth at bedtime. 04/06/23   Hoy Register, MD  tirzepatide [facility]) 12.5 MG/0.5ML Pen Inject 12.5 mg into the skin once a week. For 4 weeks then increase to 15 mg weekly thereafter. 06/11/23   Hoy Register, MD  tiZANidine (ZANAFLEX) 4 MG tablet Take 1 tablet (4 mg total) by mouth every 8 (eight) hours as needed for muscle spasms. 06/02/23   Hoy Register, MD  Vitamin D, Ergocalciferol, (DRISDOL) 1.25 MG (50000 UNIT) CAPS capsule Take 1 capsule (50,000 Units total) by mouth every 7 (seven) days. 07/07/22   Kerrin Champagne, MD  fluticasone-salmeterol (ADVAIR HFA) [phone] MCG/ACT inhaler Inhale 2 puffs into the lungs 2 (two) times daily. 10/15/17 10/15/17  Hoy Register, MD       Allergies    Reglan [metoclopramide]    Review of Systems   Review of Systems  Physical Exam Updated Vital Signs BP 119/84   Pulse 98   Temp 98.7 F (37.1 C) (Oral)   Resp 18   LMP  (LMP Unknown)   SpO2 100%  Physical Exam Vitals and nursing note reviewed.  Constitutional:      General: She is not in acute distress.    Appearance: Normal appearance. She is obese.  HENT:     Head: Normocephalic and atraumatic.     Nose: Nose normal.     Mouth/Throat:     Mouth: Mucous membranes are moist.     Pharynx: Oropharynx is clear.     Comments: No dental tenderness to percussion,  no palpable abscess, no tongue swelling or swelling below the tongue, no swelling to posterior oropharynx, no trismus, tolerating secretions Eyes:     Extraocular Movements: Extraocular movements intact.     Conjunctiva/sclera: Conjunctivae normal.  Neck:     Comments: Trach in place, palpable submandibular swelling with tenderness to palpation, no overlying erythema or warmth Cardiovascular:     Rate and Rhythm: Normal rate and regular rhythm.     Heart sounds: Normal heart sounds.  Pulmonary:     Effort: Pulmonary effort is normal.     Breath sounds: Normal breath sounds. No stridor.  Abdominal:     General: Abdomen is flat.  Musculoskeletal:        General: Normal range of motion.     Cervical back: Normal range of motion and neck supple.  Skin:    General: Skin is warm and dry.  Neurological:     General: No focal deficit present.     Mental Status: She is alert and oriented to person, place, and time.  Psychiatric:        Mood and Affect: Mood normal.        Behavior: Behavior normal.     ED Results / Procedures / Treatments   Labs (all labs ordered are listed, but only abnormal results are displayed) Labs Reviewed  BASIC METABOLIC PANEL - Abnormal; Notable for the following components:      Result Value   Glucose, Bld 204 (*)    BUN 22 (*)    Creatinine, Ser 2.28 (*)    GFR,  Estimated 25 (*)    All other components within normal limits  GROUP A STREP BY PCR  CBC WITH DIFFERENTIAL/PLATELET    EKG None  Radiology CT Soft Tissue Neck Wo Contrast  Result Date: 06/28/2023 CLINICAL DATA:  Epiglottitis or tonsillitis suspected.  Neck pain EXAM: CT NECK WITHOUT CONTRAST TECHNIQUE: Multidetector CT imaging of the neck was performed following the standard protocol without intravenous contrast. RADIATION DOSE REDUCTION: This exam was performed according to the departmental dose-optimization program which includes automated exposure control, adjustment of the mA and/or kV according to patient size and/or use of iterative reconstruction technique. COMPARISON:  06/16/2023 FINDINGS: Pharynx and larynx: Normal. No mass or swelling. Salivary glands: No inflammation, mass, or stone. Thyroid: Normal. Lymph nodes: None enlarged or abnormal density. Vascular: Negative. Limited intracranial: Negative Visualized orbits: Negative Mastoids and visualized paranasal sinuses: Clear. Skeleton: Unremarkable Upper chest: Tracheostomy tube that is located. No regional inflammation is seen. Other: IMPRESSION: No explanation for symptoms. No visible inflammation in the neck. Electronically Signed   By: Tiburcio Pea M.D.   On: 06/28/2023 12:36    Procedures Procedures    Medications Ordered in ED Medications  morphine (PF) 4 MG/ML injection 4 mg (4 mg Intravenous Given 06/28/23 1034)  lactated ringers bolus 1,000 mL (1,000 mLs Intravenous New Bag/Given 06/28/23 1247)  lidocaine (XYLOCAINE) 2 % viscous mouth solution 15 mL (15 mLs Mouth/Throat Given 06/28/23 1337)    ED Course/ Medical Decision Making/ A&P Clinical Course as of 06/28/23 1344  Sun Jun 28, 2023  1137 AKI on labs, will need CT non-con [VK]  1246 No obvious explanation of neck pain or swelling on CT. [VK]  1342 Patient continues to endorse sharp severe pains on the right side of her neck.  She will have a strep swab and will be  trialed viscous lidocaine.  The patient states that she has been unable to eat and swallow due to  the pain and will be admitted for her AKI. [VK]    Clinical Course User Index [VK] Rexford Maus, DO                                 Medical Decision Making This patient presents to the ED with chief complaint(s) of neck pain/swelling with pertinent past medical history of trach dependence, asthma, diabetes, CHF, hypertension which further complicates the presenting complaint. The complaint involves an extensive differential diagnosis and also carries with it a high risk of complications and morbidity.    The differential diagnosis includes Ludwick's or other deep space infection, trach displacement, no evidence of peritonsillar abscess, considering RPA, lymphadenopathy  Additional history obtained: Additional history obtained from spouse Records reviewed outpatient pulmonary records  ED Course and Reassessment: On patient's arrival she is mildly tachycardic and uncomfortable appearing though in no acute distress.  She has no stridor or increased work of breathing.  She has no obvious abnormalities intraorally though does have significant anterior neck swelling with tenderness to palpation submandibularly.  She will have CT of her neck to evaluate for etiology of the swelling and will be given morphine for pain and she will be closely reassessed.  Independent labs interpretation:  The following labs were independently interpreted: AKI on CKD  Independent visualization of imaging: - I independently visualized the following imaging with scope of interpretation limited to determining acute life threatening conditions related to emergency care: CT neck, which revealed no acute abnormality to explain symptoms  Consultation: - Consulted or discussed management/test interpretation w/ external professional: hospitalist  Consideration for admission or further workup: patient requires admission  for AKI Social Determinants of health: N/A    Amount and/or Complexity of Data Reviewed Labs: ordered. Radiology: ordered.  Risk Prescription drug management. Decision regarding hospitalization.          Final Clinical Impression(s) / ED Diagnoses Final diagnoses:  AKI (acute kidney injury) (HCC)  Throat pain    Rx / DC Orders ED Discharge Orders     None         Rexford Maus, DO 06/28/23 1344

## 2023-06-29 DIAGNOSIS — J4540 Moderate persistent asthma, uncomplicated: Secondary | ICD-10-CM | POA: Diagnosis not present

## 2023-06-29 DIAGNOSIS — J441 Chronic obstructive pulmonary disease with (acute) exacerbation: Secondary | ICD-10-CM | POA: Diagnosis present

## 2023-06-29 DIAGNOSIS — F32A Depression, unspecified: Secondary | ICD-10-CM | POA: Diagnosis present

## 2023-06-29 DIAGNOSIS — K3184 Gastroparesis: Secondary | ICD-10-CM | POA: Diagnosis present

## 2023-06-29 DIAGNOSIS — N179 Acute kidney failure, unspecified: Secondary | ICD-10-CM | POA: Diagnosis present

## 2023-06-29 DIAGNOSIS — E669 Obesity, unspecified: Secondary | ICD-10-CM | POA: Diagnosis present

## 2023-06-29 DIAGNOSIS — J4550 Severe persistent asthma, uncomplicated: Secondary | ICD-10-CM | POA: Diagnosis present

## 2023-06-29 DIAGNOSIS — R221 Localized swelling, mass and lump, neck: Secondary | ICD-10-CM | POA: Diagnosis not present

## 2023-06-29 DIAGNOSIS — I130 Hypertensive heart and chronic kidney disease with heart failure and stage 1 through stage 4 chronic kidney disease, or unspecified chronic kidney disease: Secondary | ICD-10-CM | POA: Diagnosis present

## 2023-06-29 DIAGNOSIS — Z7951 Long term (current) use of inhaled steroids: Secondary | ICD-10-CM | POA: Diagnosis not present

## 2023-06-29 DIAGNOSIS — K029 Dental caries, unspecified: Secondary | ICD-10-CM | POA: Diagnosis present

## 2023-06-29 DIAGNOSIS — J9611 Chronic respiratory failure with hypoxia: Secondary | ICD-10-CM | POA: Diagnosis present

## 2023-06-29 DIAGNOSIS — R112 Nausea with vomiting, unspecified: Secondary | ICD-10-CM | POA: Diagnosis not present

## 2023-06-29 DIAGNOSIS — K2100 Gastro-esophageal reflux disease with esophagitis, without bleeding: Secondary | ICD-10-CM | POA: Diagnosis present

## 2023-06-29 DIAGNOSIS — N1831 Chronic kidney disease, stage 3a: Secondary | ICD-10-CM | POA: Diagnosis present

## 2023-06-29 DIAGNOSIS — G8929 Other chronic pain: Secondary | ICD-10-CM | POA: Diagnosis present

## 2023-06-29 DIAGNOSIS — Z79899 Other long term (current) drug therapy: Secondary | ICD-10-CM | POA: Diagnosis not present

## 2023-06-29 DIAGNOSIS — I5032 Chronic diastolic (congestive) heart failure: Secondary | ICD-10-CM | POA: Diagnosis present

## 2023-06-29 DIAGNOSIS — E1043 Type 1 diabetes mellitus with diabetic autonomic (poly)neuropathy: Secondary | ICD-10-CM | POA: Diagnosis present

## 2023-06-29 DIAGNOSIS — R1010 Upper abdominal pain, unspecified: Secondary | ICD-10-CM | POA: Diagnosis not present

## 2023-06-29 DIAGNOSIS — E785 Hyperlipidemia, unspecified: Secondary | ICD-10-CM | POA: Diagnosis present

## 2023-06-29 DIAGNOSIS — Z87891 Personal history of nicotine dependence: Secondary | ICD-10-CM | POA: Diagnosis not present

## 2023-06-29 DIAGNOSIS — Z8249 Family history of ischemic heart disease and other diseases of the circulatory system: Secondary | ICD-10-CM | POA: Diagnosis not present

## 2023-06-29 DIAGNOSIS — Z930 Tracheostomy status: Secondary | ICD-10-CM | POA: Diagnosis not present

## 2023-06-29 DIAGNOSIS — Z7985 Long-term (current) use of injectable non-insulin antidiabetic drugs: Secondary | ICD-10-CM | POA: Diagnosis not present

## 2023-06-29 DIAGNOSIS — R070 Pain in throat: Secondary | ICD-10-CM | POA: Diagnosis present

## 2023-06-29 DIAGNOSIS — E1022 Type 1 diabetes mellitus with diabetic chronic kidney disease: Secondary | ICD-10-CM | POA: Diagnosis present

## 2023-06-29 DIAGNOSIS — Z823 Family history of stroke: Secondary | ICD-10-CM | POA: Diagnosis not present

## 2023-06-29 DIAGNOSIS — K224 Dyskinesia of esophagus: Secondary | ICD-10-CM | POA: Diagnosis not present

## 2023-06-29 DIAGNOSIS — Z794 Long term (current) use of insulin: Secondary | ICD-10-CM | POA: Diagnosis not present

## 2023-06-29 LAB — BASIC METABOLIC PANEL WITH GFR
Anion gap: 10 (ref 5–15)
BUN: 15 mg/dL (ref 6–20)
CO2: 25 mmol/L (ref 22–32)
Calcium: 9 mg/dL (ref 8.9–10.3)
Chloride: 102 mmol/L (ref 98–111)
Creatinine, Ser: 1.37 mg/dL — ABNORMAL HIGH (ref 0.44–1.00)
GFR, Estimated: 46 mL/min — ABNORMAL LOW (ref >60)
Glucose, Bld: 269 mg/dL — ABNORMAL HIGH (ref 70–99)
Potassium: 4.3 mmol/L (ref 3.5–5.1)
Sodium: 137 mmol/L (ref 135–145)

## 2023-06-29 LAB — CBC
HCT: 36 % (ref 36.0–46.0)
Hemoglobin: 11.6 g/dL — ABNORMAL LOW (ref 12.0–15.0)
MCH: 28.5 pg (ref 26.0–34.0)
MCHC: 32.2 g/dL (ref 30.0–36.0)
MCV: 88.5 fL (ref 80.0–100.0)
Platelets: 346 K/uL (ref 150–400)
RBC: 4.07 MIL/uL (ref 3.87–5.11)
RDW: 14.4 % (ref 11.5–15.5)
WBC: 6.8 K/uL (ref 4.0–10.5)
nRBC: 0 % (ref 0.0–0.2)

## 2023-06-29 LAB — HIV ANTIBODY (ROUTINE TESTING W REFLEX): HIV Screen 4th Generation wRfx: NONREACTIVE

## 2023-06-29 LAB — GLUCOSE, CAPILLARY
Glucose-Capillary: 184 mg/dL — ABNORMAL HIGH (ref 70–99)
Glucose-Capillary: 220 mg/dL — ABNORMAL HIGH (ref 70–99)
Glucose-Capillary: 161 mg/dL — ABNORMAL HIGH (ref 70–99)
Glucose-Capillary: 210 mg/dL — ABNORMAL HIGH (ref 70–99)

## 2023-06-29 MED ORDER — PANTOPRAZOLE SODIUM 40 MG PO TBEC
40.0000 mg | DELAYED_RELEASE_TABLET | Freq: Two times a day (BID) | ORAL | Status: DC
  Administered 2023-06-29 – 2023-07-02 (×7): 40 mg via ORAL
  Filled 2023-06-29 (×8): qty 1

## 2023-06-29 MED ORDER — INSULIN GLARGINE-YFGN 100 UNIT/ML ~~LOC~~ SOLN
10.0000 [IU] | Freq: Every day | SUBCUTANEOUS | Status: DC
  Administered 2023-06-29 – 2023-07-01 (×3): 10 [IU] via SUBCUTANEOUS
  Filled 2023-06-29 (×3): qty 0.1

## 2023-06-29 MED ORDER — METHOCARBAMOL 1000 MG/10ML IJ SOLN
500.0000 mg | Freq: Three times a day (TID) | INTRAVENOUS | Status: DC | PRN
  Administered 2023-06-29: 500 mg via INTRAVENOUS
  Filled 2023-06-29: qty 500

## 2023-06-29 NOTE — Care Management Obs Status (Signed)
MEDICARE OBSERVATION STATUS NOTIFICATION   Patient Details  Name: [name] MRN: [id number] Date of Birth: 1970-09-29   Medicare Observation Status Notification Given:  Yes    Ronny Bacon, RN 06/29/2023, 8:04 AM

## 2023-06-29 NOTE — Inpatient Diabetes Management (Addendum)
Inpatient Diabetes Program Recommendations  AACE/ADA: New Consensus Statement on Inpatient Glycemic Control (2015)  Target Ranges:  Prepandial:   less than 140 mg/dL      Peak postprandial:   less than 180 mg/dL (1-2 hours)      Critically ill patients:  140 - 180 mg/dL   Lab Results  Component Value Date   GLUCAP 220 (H) 06/29/2023   HGBA1C 8.9 (H) 06/28/2023    Review of Glycemic Control  Latest Reference Range & Units 06/28/23 17:36 06/28/23 22:49 06/29/23 07:54 06/29/23 11:51  Glucose-Capillary 70 - 99 mg/dL 782 (H) 956 (H) 213 (H) 220 (H)   Diabetes history: DM  Outpatient Diabetes medications:  FS libre Farxiga 10 mg daily Tresiba 170 units daily Humalog 10 units tid with meals  Mounjaro 12.5 mg weekly Current orders for Inpatient glycemic control:  Novolog 0-15 units tid with meals  Semglee 10 units daily  Inpatient Diabetes Program Recommendations:    Note patient takes significant dose of [place] daily at home.  If CBG's > goal, consider increasing Semglee to 40 units daily.  Also may consider adding Novolog meal coverage 5 units tid with meals (hold if patient eats less than 50% or NPO).   Thanks,  Beryl Meager, RN, BC-ADM Inpatient Diabetes Coordinator Pager [phone] (8a-5p)

## 2023-06-29 NOTE — Progress Notes (Addendum)
HD#0 Subjective:   Summary: [name] is a 52 y.o. female with a past medical history of hypertension, hyperlipidemia, chronic diastolic heart failure, OSA, chronic respiratory failure, severe persistent asthma s/p trach dependent, chronic pain, depression, anxiety and type 1 diabetes who presents for neck pain and swelling   Overnight Events: Pain, nausea and small volume emesis overnight. Night team ordered IV zofran and fentanyl prn to control symptoms.   Patient continues to report pain this morning. Pain is on the right side, sharp, underneath her trach site and on sub mandibular. Additional symptoms include headache, cough, nausea and small volume emesis overnight. Only able to tolerate cranberry juice for now. Nausea and vomiting has been going on the past month. Accompanied with abdominal pain. Unsure if she's ever followed up with GI outpatient. Believes she's had an EGD and Colonoscopy. Denies current fevers or chills. Motivated to eat, however is frustrated by ongoing symptoms the past several weeks.      Objective:  Vital signs in last 24 hours: Vitals:   06/29/23 0843 06/29/23 0924 06/29/23 0924 06/29/23 1200  BP:   136/80   Pulse: (!) 120  99 97  Resp: (!) 22   20  Temp:  98.1 F (36.7 C)    TempSrc:  Oral    SpO2: 95%  100% 98%    There were no vitals filed for this visit.  Intake/Output Summary (Last 24 hours) at 06/29/2023 1345 Last data filed at 06/29/2023 0039 Gross per 24 hour  Intake 1627.78 ml  Output --  Net 1627.78 ml   Net IO Since Admission: 1,627.78 mL [06/29/23 1345]   Physical exam Physical Exam Constitutional:      Appearance: Normal appearance. She is obese. She is not diaphoretic.     Comments: Tearful    HENT:     Mouth/Throat:     Mouth: Mucous membranes are moist.     Dentition: Abnormal dentition. Dental caries present.      Comments: Fractured tooth #32, caries on #17 Eyes:     Conjunctiva/sclera: Conjunctivae  normal.  Neck:     Trachea: Tracheostomy present.     Comments: ROM limited due to pain,  not hot to the touch, neck appears symmetrical, tracheostomy in place since 2018 Cardiovascular:     Rate and Rhythm: Normal rate.  Pulmonary:     Effort: Pulmonary effort is normal.     Breath sounds: Normal breath sounds. No wheezing.  Abdominal:     General: Bowel sounds are normal. There is no distension.     Palpations: Abdomen is soft.     Tenderness: There is abdominal tenderness in the epigastric area. There is no guarding.  Musculoskeletal:     Cervical back: Normal range of motion and neck supple. Tenderness present. No edema or erythema. Pain with movement present. Normal range of motion.  Neurological:     General: No focal deficit present.     Mental Status: She is alert.    Pertinent Labs:     Latest Ref Rng & Units 06/29/2023    8:48 AM 06/28/2023   10:35 AM 03/12/2023   12:59 AM  CBC  WBC 4.0 - 10.5 K/uL 6.8  7.9  7.7   Hemoglobin 12.0 - 15.0 g/dL 16.1  09.6  04.5   Hematocrit 36.0 - 46.0 % 36.0  37.7  31.4   Platelets 150 - 400 K/uL 346  353  249       Latest Ref Rng &  Units 06/29/2023    8:48 AM 06/28/2023   10:35 AM 04/06/2023   11:56 AM  BMP  Glucose 70 - 99 mg/dL 782  956  213   BUN 6 - 20 mg/dL 15  22  15    Creatinine 0.44 - 1.00 mg/dL 0.86  5.78  4.69   BUN/Creat Ratio 9 - 23   11   Sodium 135 - 145 mmol/L 137  138  142   Potassium 3.5 - 5.1 mmol/L 4.3  4.3  4.4   Chloride 98 - 111 mmol/L 102  104  102   CO2 22 - 32 mmol/L 25  26  21    Calcium 8.9 - 10.3 mg/dL 9.0  9.1  9.8     Imaging:  CT Soft Tissue Neck Wo Contrast CLINICAL DATA:  Epiglottitis or tonsillitis suspected.  Neck pain  EXAM: CT NECK WITHOUT CONTRAST  TECHNIQUE: Multidetector CT imaging of the neck was performed following the standard protocol without intravenous contrast.  RADIATION DOSE REDUCTION: This exam was performed according to the departmental dose-optimization program which  includes automated exposure control, adjustment of the mA and/or kV according to patient size and/or use of iterative reconstruction technique.  COMPARISON:  06/16/2023  FINDINGS: Pharynx and larynx: Normal. No mass or swelling.  Salivary glands: No inflammation, mass, or stone.  Thyroid: Normal.  Lymph nodes: None enlarged or abnormal density.  Vascular: Negative.  Limited intracranial: Negative  Visualized orbits: Negative  Mastoids and visualized paranasal sinuses: Clear.  Skeleton: Unremarkable  Upper chest: Tracheostomy tube that is located. No regional inflammation is seen.  Other:  IMPRESSION: No explanation for symptoms. No visible inflammation in the neck.  Electronically Signed   By: Tiburcio Pea M.D.   On: 06/28/2023 12:36   Assessment/Plan:   Principal Problem:   AKI (acute kidney injury) (HCC)   Patient Summary: [name] is a 52 y.o. female with a past medical history of hypertension, hyperlipidemia, chronic diastolic heart failure, OSA, chronic respiratory failure, severe persistent asthma s/p trach dependent, chronic pain, depression, anxiety and type 1 diabetes who presents for neck pain and swelling who was admitted for AKI in the setting of limited PO intake.   Right sided neck pain and swelling  Neck pain ongoing for 1-2 weeks, with limited PO intake and swelling. On assessment today, no surrounding erythema, drainage, sign of infection or Sialorrhea noted on exam. CT imaging without any signs of inflammation, abscess or fracture. Continuing to work on pain control, with additions of fentanyl prn, home percocet regimen.     - Tylenol 650 mg q6h prn, oxycodone 5 mg q8h, oxycodone-acetaminophen 5-325 mg q8h prn, fentanyl 25 mcg prn - IV Robaxin 500 mg IV q8h prn ordered, consider uptitrating if renally appropriate     - Cold and Heat compresses prn  - IV zofran 4 mg q6h prn  - Patient needs outpatient follow-up with  dentistry   Nausea Hx Gastritis Per chart review patient has been experiencing nausea since June 2024, requesting zofran from her PCP to help control symptoms. On assessment, this morning patient continues to have nausea, requiring IV zofran. Patient has been evaluated by [facility], most recently in March 2023 for chronic nausea, vomiting and upper abdominal pain with normal EGD, gastric emptying studies and CT scan. Upper endoscopy, with gastric biopsies showed minimal inflmmation of esophagus, chronic gastritis w/ lymphoid aggregates and focal intestinal metaplasia. At that time, they recommended minimizing narcotics and suspected it could be the reason behind  her GI symptoms. Colonscopy in 2021 with 13 polyps removed.  - IV zofran 4 mg q6h prn  - Optimize pain regimen  - Carb modified diet and encourage PO intake    Acute Kidney Injury (resolved)  Patients Cr elevated at 2.28 and BUN of 22, suspect injury pre-renal given limited PO intake due to neck pain, nausea and emesis that past couple weeks. Cr 1.23 this morning and much closer to baseline.   - Carb modified diet and encourage PO intake  - Avoid nephrotoxic agents  - Renally dose meds    Chronic Medical Conditions: Chronic pain: Home regimen of pregabalin 50 mg TID, percocet 10-325 QID prn, tizanadine 4 mg q8h prn and tylenol 500 mg BID prn  Continue pregabalin 50 mg TID   Asthma s/p complicated intubation and now trach dependent since 2018 Home regimen: Duelra 200-5  2 puff inhalation, BID and singulair 10 mg at bedtime  Continue home regimen    Diabetes Mellitus  Most recent A1c June 2024 was 10.3 >>> 8.3 on admission, home regimen Tresiba 170 units, Humalog 10units TID w/ meals, Mounjaro 12.5 mg once weekly. Bgs 110s-180s this morning.  - Hold home regmen given poor PO intake  - Moderate SSI  - Ordered Semglee 10 units for basal coverage  - BG checks w/ meals and at bedtime    Hypertension  Blood pressure  ranging between 90-140s/50-90 diastolic's.  Home regimen: lisinopril 20 mg daily and amlodipine 5 mg at bedtime  - Holding home meds   Depression, Anxiety  Home regimen bupropion 150 mg BID and sertraline 100 mg 2 tabs at night - Continue home meds     Migraines:  Home regimen: amitriptyline 10 mg at bedtime, patient unsure if currently taking  Holding home med  Diet: Carb-Modified Bowel: Senna tablet prn  IVF: None VTE: Enoxaparin Code: Full PT/OT recs: Pending TOC recs: pending  Family Update: [name] [phone]  Dispo: Anticipated discharge to  TBD  in 1-2 days pending tolerating PO and pain control optimized.   Peterson Ao, MD PGY-1 Psychiatry Resident Contact: secure message or page [phone] Please contact the on call pager after 5 pm and on weekends at [phone].

## 2023-06-30 ENCOUNTER — Ambulatory Visit: Payer: 59 | Admitting: Cardiology

## 2023-06-30 DIAGNOSIS — R221 Localized swelling, mass and lump, neck: Secondary | ICD-10-CM | POA: Diagnosis not present

## 2023-06-30 DIAGNOSIS — R112 Nausea with vomiting, unspecified: Secondary | ICD-10-CM | POA: Diagnosis not present

## 2023-06-30 LAB — GLUCOSE, CAPILLARY
Glucose-Capillary: 205 mg/dL — ABNORMAL HIGH (ref 70–99)
Glucose-Capillary: 186 mg/dL — ABNORMAL HIGH (ref 70–99)
Glucose-Capillary: 219 mg/dL — ABNORMAL HIGH (ref 70–99)
Glucose-Capillary: 243 mg/dL — ABNORMAL HIGH (ref 70–99)

## 2023-06-30 LAB — BASIC METABOLIC PANEL
Anion gap: 12 (ref 5–15)
BUN: 14 mg/dL (ref 6–20)
CO2: 22 mmol/L (ref 22–32)
Calcium: 9.2 mg/dL (ref 8.9–10.3)
Chloride: 101 mmol/L (ref 98–111)
Creatinine, Ser: 1.45 mg/dL — ABNORMAL HIGH (ref 0.44–1.00)
GFR, Estimated: 43 mL/min — ABNORMAL LOW (ref >60)
Glucose, Bld: 206 mg/dL — ABNORMAL HIGH (ref 70–99)
Potassium: 4 mmol/L (ref 3.5–5.1)
Sodium: 135 mmol/L (ref 135–145)

## 2023-06-30 MED ORDER — METHOCARBAMOL 1000 MG/10ML IJ SOLN
1000.0000 mg | Freq: Three times a day (TID) | INTRAVENOUS | Status: DC
  Administered 2023-06-30 – 2023-07-02 (×6): 1000 mg via INTRAVENOUS
  Filled 2023-06-30 (×2): qty 1000
  Filled 2023-06-30 (×2): qty 10
  Filled 2023-06-30: qty 1000
  Filled 2023-06-30: qty 10
  Filled 2023-06-30: qty 1000
  Filled 2023-06-30: qty 10

## 2023-06-30 MED ORDER — INSULIN ASPART 100 UNIT/ML IJ SOLN
0.0000 [IU] | Freq: Three times a day (TID) | INTRAMUSCULAR | Status: DC
  Administered 2023-07-01 – 2023-07-02 (×4): 7 [IU] via SUBCUTANEOUS

## 2023-06-30 MED ORDER — AMITRIPTYLINE HCL 10 MG PO TABS
10.0000 mg | ORAL_TABLET | Freq: Every day | ORAL | Status: DC
  Administered 2023-06-30 – 2023-07-01 (×2): 10 mg via ORAL
  Filled 2023-06-30 (×3): qty 1

## 2023-06-30 NOTE — TOC CM/SW Note (Signed)
Transition of Care [facility]) - Inpatient Brief Assessment   Patient Details  Name: [name] MRN: [id number] Date of Birth: 1971-02-24  [facility] CM/SW Contact:    Tom-Johnson, Hershal Coria, RN Phone Number: 06/30/2023, 10:06 AM   Clinical Narrative:  Patient presented to the ED with Rt Neck Swelling with Pain, pain at [facility] site and Epigastric pain with poor Oral intake. Found to have AKI which is resolved with IV fluids.  Patient has Hx of Respiratory Failure, requiring Intubation and Tracheostomy in 2018, followed by ENT at [facility],  Asthma, Hypertension, Chronic Diastolic Heart Failure, OSA, Chronic Pain, Depression, Anxiety and Type 1 DM.    From home with husband, has three biological and six step children. Husband and daughter drives to and from appointments. Receives Trach and O2 supplies from [employer]. On 3L O2 at HS at home.  Patient states she has Home health RN from [facility]. PCP is Hoy Register, MD and uses Psychologist, forensic at [facility].   No TOC needs or recommendations noted at this time.  Patient not Medically ready for discharge.  CM will continue to follow as patient progresses with care towards discharge.         Transition of Care Asessment: Insurance and Status: Insurance coverage has been reviewed Patient has primary care physician: Yes Home environment has been reviewed: Yes Prior level of function:: Modified Independent Prior/Current Home Services: Current home services Social Determinants of Health Reivew: SDOH reviewed no interventions necessary Readmission risk has been reviewed: Yes Transition of care needs: no transition of care needs at this time

## 2023-06-30 NOTE — Evaluation (Signed)
Clinical/Bedside Swallow Evaluation Patient Details  Name: [name] MRN: [id number] Date of Birth: 1971-06-25  Today's Date: 06/30/2023 Time: SLP Start Time (ACUTE ONLY): 1200 SLP Stop Time (ACUTE ONLY): 1210 SLP Time Calculation (min) (ACUTE ONLY): 10 min  Past Medical History:  Past Medical History:  Diagnosis Date   Abnormal UGI series    Acute pain of right shoulder due to trauma 10/05/2018   Acute respiratory failure with hypoxia and hypercapnia (HCC) 10/23/2018   Allergy    Anemia    Anxiety    Arthritis    Asthma    Chest pain of uncertain etiology 03/22/2019   Chronic back pain    Chronic chest pain    resolved, no problems since 2019 per patient 07/27/19   Coffee ground vomiting 10/14/2021   COPD (chronic obstructive pulmonary disease) (HCC)    inhaler   Depression    DM (diabetes mellitus) (HCC)    INSULIN DEPENDENT - TYPE 1   Dyspnea    GERD (gastroesophageal reflux disease)    Headache(784.0)    History of hiatal hernia    Hyperlipidemia    no med, diet controlled   Hypertension    Hypokalemia    Klebsiella cystitis 03/14/2017   Last Assessment & Plan:   Formatting of this note might be different from the original.  - On Zosyn  - Urine culture: Klebsiella pneumoniae   Neuromuscular disorder (HCC)    Osteoarthritis    Oxygen deficiency    Pneumonia    Respiratory disease 05/2017   uses inhalers, neb tx prn, no oxygen   Rotator cuff tear 07/28/2019   Sleep apnea    does not use CPAP due to trach, uses 2L supplement oxygen at night   Status post tracheostomy (HCC) 03/14/2017   Last Assessment & Plan:  Formatting of this note might be different from the original. - VERY CRITICAL AIRWAY WITH EXTREME PRECUATION - On tach - ENT following along (please see above for current trach management plan)   Tracheostomy in place [facility]) 02/2017   Past Surgical History:  Past Surgical History:  Procedure Laterality Date   ABDOMINAL HYSTERECTOMY  2009    APPENDECTOMY     BIOPSY  10/15/2021   Procedure: BIOPSY;  Surgeon: Tressia Danas, MD;  Location: [facility] ENDOSCOPY;  Service: Gastroenterology;;   CESAREAN SECTION     x 3   CHOLECYSTECTOMY N/A 03/02/2014   Procedure: LAPAROSCOPIC CHOLECYSTECTOMY;  Surgeon: Clovis Pu. Cornett, MD;  Location: [facility] OR;  Service: General;  Laterality: N/A;   ESOPHAGOGASTRODUODENOSCOPY (EGD) WITH PROPOFOL N/A 10/15/2021   Procedure: ESOPHAGOGASTRODUODENOSCOPY (EGD) WITH PROPOFOL;  Surgeon: Tressia Danas, MD;  Location: [facility] ENDOSCOPY;  Service: Gastroenterology;  Laterality: N/A;   HERNIA REPAIR     PANENDOSCOPY N/A 03/04/2017   Procedure: PANENDOSCOPY WITH POSSIBLE FOREIGN BODY REMOVAL;  Surgeon: Flo Shanks, MD;  Location: [facility] OR;  Service: ENT;  Laterality: N/A;   ROTATOR CUFF REPAIR Left    ROTATOR CUFF REPAIR Right 07/27/2019   SHOULDER ARTHROSCOPY WITH SUBACROMIAL DECOMPRESSION, ROTATOR CUFF REPAIR AND BICEP TENDON REPAIR Right 07/28/2019   Procedure: RIGHT SHOULDER ARTHROSCOP, MINI OPEN ROTATOR CUFF TEAR REPAIR,  BICEPS TENODESIS, DISTAL CLAVICLE EXCISION;  Surgeon: Cammy Copa, MD;  Location: [facility] OR;  Service: Orthopedics;  Laterality: Right;   SPINE SURGERY     TOOTH EXTRACTION N/A 02/22/2021   Procedure: DENTAL RESTORATION/EXTRACTIONS;  Surgeon: Ocie Doyne, DMD;  Location: [facility] OR;  Service: Oral Surgery;  Laterality: N/A;   TRACHEOSTOMY  02/2017  TUBAL LIGATION     VESICOVAGINAL FISTULA CLOSURE W/ TAH  2009   HPI:  [name] is a 52 y.o. female with a past medical history of hypertension, hyperlipidemia, chronic diastolic heart failure, OSA, chronic respiratory failure, severe persistent asthma s/p trach dependent, chronic pain, depression, anxiety and type 1 diabetes who presents for neck pain and swelling. CT unremarkable.  Pain is on the right side, sharp, underneath her trach site and on sub mandibular. Additional symptoms include headache, nausea and small volume  emesis this morning. Has attempted to eat food but is unable to keep food down and is frustrated. Pt has a trach, doesn wear PMSV due to stenosis (complex history), uses finger occlusion. Pt had UGI in 1/23 that shows dysmotility and reflux possible distal stricture.    Assessment / Plan / Recommendation  Clinical Impression  Pt seen with lunch meal, had already eaten most of regular texture foods. Pt grimacing when she was swallowing. Also observed to have episodes of almost vomiting. Symptoms appear most consistent with a reflux issue. Pt says she vomits within an hour of eating and reports waking up at night coughing and choking. Vomiting and LPR could cause painful swallowing. Pt is tolerating textures while masticating and swallowing. SLP intervention not helpful in this case. Would refer to GI. SLP Visit Diagnosis: Dysphagia, unspecified (R13.10)    Aspiration Risk  Risk for inadequate nutrition/hydration    Diet Recommendation Regular;Thin liquid    Liquid Administration via: Cup;Straw Medication Administration: Whole meds with liquid Supervision: Patient able to self feed Postural Changes: Seated upright at 90 degrees;Remain upright for at least 30 minutes after po intake    Other  Recommendations Recommended Consults: Consider esophageal assessment;Consider GI evaluation Oral Care Recommendations: Oral care BID    Recommendations for follow up therapy are one component of a multi-disciplinary discharge planning process, led by the attending physician.  Recommendations may be updated based on patient status, additional functional criteria and insurance authorization.  Follow up Recommendations        Assistance Recommended at Discharge    Functional Status Assessment    Frequency and Duration            Prognosis        Swallow Study   General HPI: [name] is a 52 y.o. female with a past medical history of hypertension, hyperlipidemia, chronic  diastolic heart failure, OSA, chronic respiratory failure, severe persistent asthma s/p trach dependent, chronic pain, depression, anxiety and type 1 diabetes who presents for neck pain and swelling. CT unremarkable.  Pain is on the right side, sharp, underneath her trach site and on sub mandibular. Additional symptoms include headache, nausea and small volume emesis this morning. Has attempted to eat food but is unable to keep food down and is frustrated. Pt has a trach, doesn wear PMSV due to stenosis (complex history), uses finger occlusion. Pt had UGI in 1/23 that shows dysmotility and reflux possible distal stricture. Type of Study: Bedside Swallow Evaluation Previous Swallow Assessment: see HPI Diet Prior to this Study: Regular;Thin liquids (Level 0) Temperature Spikes Noted: No Respiratory Status: Trach History of Recent Intubation: No Behavior/Cognition: Alert;Cooperative;Pleasant mood Oral Cavity Assessment: Within Functional Limits Oral Care Completed by SLP: No Oral Cavity - Dentition: Adequate natural dentition Vision: Functional for self-feeding Self-Feeding Abilities: Able to feed self Patient Positioning: Partially reclined Baseline Vocal Quality: Hoarse Volitional Cough: Strong Volitional Swallow: Able to elicit    Oral/Motor/Sensory Function Overall Oral Motor/Sensory Function: Within  functional limits   Ice Chips     Thin Liquid Thin Liquid: Within functional limits    Nectar Thick Nectar Thick Liquid: Not tested   Honey Thick Honey Thick Liquid: Not tested   Puree Puree: Not tested   Solid     Solid: Within functional limits      Masiel Gentzler, Riley Nearing 06/30/2023,12:51 PM

## 2023-06-30 NOTE — Plan of Care (Signed)
  Problem: Education: Goal: Knowledge of General Education information will improve Description: Including pain rating scale, medication(s)/side effects and non-pharmacologic comfort measures Outcome: Completed/Met

## 2023-06-30 NOTE — Inpatient Diabetes Management (Signed)
Inpatient Diabetes Program Recommendations  AACE/ADA: New Consensus Statement on Inpatient Glycemic Control (2015)  Target Ranges:  Prepandial:   less than 140 mg/dL      Peak postprandial:   less than 180 mg/dL (1-2 hours)      Critically ill patients:  140 - 180 mg/dL   Lab Results  Component Value Date   GLUCAP 205 (H) 06/30/2023   HGBA1C 8.9 (H) 06/28/2023    Review of Glycemic Control  Latest Reference Range & Units 06/29/23 07:54 06/29/23 11:51 06/29/23 16:57 06/29/23 21:06 06/30/23 07:42  Glucose-Capillary 70 - 99 mg/dL 595 (H) 638 (H) 756 (H) 210 (H) 205 (H)   Diabetes history: DM  Outpatient Diabetes medications:  Freestyle Libre  Farxiga 10 mg daily Tresiba 170 units daily Novolog 10 units tid with meals  Mounjaro 12.5 mg weekly Current orders for Inpatient glycemic control:  Novolog 0-15 units tid with meals  Semglee 10 units daily  Inpatient Diabetes Program Recommendations:    Note reduced intake/difficulty eating.  May need slight increase in Semglee to 15 units daily?   Note that insulin needs have been significantly lower than home doses.  May need adjustment in home insulins as well.   Thanks,  Beryl Meager, RN, BC-ADM Inpatient Diabetes Coordinator Pager [phone]  (8a-5p)

## 2023-06-30 NOTE — Progress Notes (Addendum)
HD#1 Subjective:   Summary: [name] is a 52 y.o. female with a past medical history of hypertension, hyperlipidemia, chronic diastolic heart failure, OSA, chronic respiratory failure, severe persistent asthma s/p trach dependent, chronic pain, depression, anxiety and type 1 diabetes who presents for neck pain and swelling   Overnight Events: No acute events overnight.   Patient continues to report pain this morning. Pain is on the right side, sharp, underneath her trach site and on sub mandibular. Additional symptoms include headache, nausea and small volume emesis this morning. Has attempted to eat food but is unable to keep food down and is frustrated. Discussed the option of discontinuing fentanyl and increasing additional IV medication with goal of getting closer to home regimen. Patient amenable.    Objective:  Vital signs in last 24 hours: Vitals:   06/29/23 2104 06/29/23 2200 06/30/23 0253 06/30/23 0602  BP: (!) 137/90   (!) 132/91  Pulse: 95 95 95 95  Resp: 19 19 19 19   Temp: 98.4 F (36.9 C)   98.2 F (36.8 C)  TempSrc: Oral   Oral  SpO2: 99% 95% 95% 100%    There were no vitals filed for this visit.  Intake/Output Summary (Last 24 hours) at 06/30/2023 0803 Last data filed at 06/30/2023 0640 Gross per 24 hour  Intake 654.01 ml  Output 0 ml  Net 654.01 ml   Net IO Since Admission: 2,281.79 mL [06/30/23 0803]   Physical exam Physical Exam Constitutional:      Appearance: Normal appearance. She is obese. She is not diaphoretic.     Comments: Tearful    HENT:     Mouth/Throat:     Mouth: Mucous membranes are moist.     Dentition: Abnormal dentition. Dental caries present.      Comments: Fractured tooth #32, caries on #17 Eyes:     Conjunctiva/sclera: Conjunctivae normal.  Neck:     Trachea: Tracheostomy present.     Comments: ROM limited due to pain,  not hot to the touch, neck appears symmetrical, tracheostomy in place since  2018 Cardiovascular:     Rate and Rhythm: Normal rate.  Pulmonary:     Effort: Pulmonary effort is normal.     Breath sounds: Normal breath sounds. No wheezing.  Abdominal:     General: Bowel sounds are normal. There is no distension.     Palpations: Abdomen is soft.     Tenderness: There is no abdominal tenderness. There is no guarding.  Musculoskeletal:     Cervical back: Normal range of motion and neck supple. Tenderness present. No edema or erythema. Pain with movement present. Normal range of motion.  Neurological:     General: No focal deficit present.     Mental Status: She is alert.    Pertinent Labs:     Latest Ref Rng & Units 06/29/2023    8:48 AM 06/28/2023   10:35 AM 03/12/2023   12:59 AM  CBC  WBC 4.0 - 10.5 K/uL 6.8  7.9  7.7   Hemoglobin 12.0 - 15.0 g/dL 46.9  62.9  52.8   Hematocrit 36.0 - 46.0 % 36.0  37.7  31.4   Platelets 150 - 400 K/uL 346  353  249       Latest Ref Rng & Units 06/30/2023    4:37 AM 06/29/2023    8:48 AM 06/28/2023   10:35 AM  BMP  Glucose 70 - 99 mg/dL 413  244  010   BUN  6 - 20 mg/dL 14  15  22    Creatinine 0.44 - 1.00 mg/dL 1.61  0.96  0.45   Sodium 135 - 145 mmol/L 135  137  138   Potassium 3.5 - 5.1 mmol/L 4.0  4.3  4.3   Chloride 98 - 111 mmol/L 101  102  104   CO2 22 - 32 mmol/L 22  25  26    Calcium 8.9 - 10.3 mg/dL 9.2  9.0  9.1     Imaging:  CT Soft Tissue Neck Wo Contrast CLINICAL DATA:  Epiglottitis or tonsillitis suspected.  Neck pain  EXAM: CT NECK WITHOUT CONTRAST  TECHNIQUE: Multidetector CT imaging of the neck was performed following the standard protocol without intravenous contrast.  RADIATION DOSE REDUCTION: This exam was performed according to the departmental dose-optimization program which includes automated exposure control, adjustment of the mA and/or kV according to patient size and/or use of iterative reconstruction technique.  COMPARISON:  06/16/2023  FINDINGS: Pharynx and larynx: Normal. No  mass or swelling.  Salivary glands: No inflammation, mass, or stone.  Thyroid: Normal.  Lymph nodes: None enlarged or abnormal density.  Vascular: Negative.  Limited intracranial: Negative  Visualized orbits: Negative  Mastoids and visualized paranasal sinuses: Clear.  Skeleton: Unremarkable  Upper chest: Tracheostomy tube that is located. No regional inflammation is seen.  Other:  IMPRESSION: No explanation for symptoms. No visible inflammation in the neck.  Electronically Signed   By: Tiburcio Pea M.D.   On: 06/28/2023 12:36   Assessment/Plan:   Principal Problem:   AKI (acute kidney injury) (HCC)  Patient Summary: [name] is a 52 y.o. female with a past medical history of hypertension, hyperlipidemia, chronic diastolic heart failure, OSA, chronic respiratory failure, severe persistent asthma s/p trach dependent, chronic pain, depression, anxiety and type 1 diabetes who presents for neck pain and swelling who was admitted for AKI in the setting of limited PO intake.   Right sided neck pain and swelling  Neck pain ongoing for 1-2 weeks, with limited PO intake and swelling. On assessment today, no surrounding erythema, drainage, sign of infection or Sialorrhea noted on exam. CT imaging without any signs of inflammation, abscess or fracture. 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  Continuing to work on pain control, with additions of IV robaxin and home percocet regimen.     - Tylenol 650 mg q6h prn, oxycodone-acetaminophen 10-325 mg q8h prn, IV Robaxin 1000 mg IV q8h  - Discontinued IV fentanyl  - Cold and Heat compresses prn - Follow-up with [facility] for trach management   Nausea & Vomiting  Hx Gastritis Per chart review patient has been experiencing nausea since June 2024, requesting zofran from her PCP to help control symptoms.  On assessment, this morning patient continues to have nausea, requiring IV zofran. Patient has been evaluated by [facility], most recently in March 2023 for chronic nausea, vomiting and upper abdominal pain with normal EGD, gastric emptying studies and CT scan. Upper endoscopy, with gastric biopsies showed minimal inflmmation of esophagus, chronic gastritis w/ lymphoid aggregates and focal intestinal metaplasia. At that time, they recommended minimizing narcotics and suspected it could be the reason behind her GI symptoms. Colonscopy in 2021 with 13 polyps removed. Symptoms have be multifactoral with polypharmacy ( high dose sertraline, opioids, DM regimen, ?gastropersis) Will have speech pathology evaluate to rule out dysphagia or esophageal stricture.  -  IV zofran 4 mg q6h prn  - Optimize pain regimen  - Carb modified diet and encourage PO intake  - Speech Path consult placed    Acute Kidney Injury (resolved)  Patients Cr elevated at 2.28 and BUN of 22, suspect injury pre-renal given limited PO intake due to neck pain, nausea and emesis that past couple weeks. Cr 1.45 this morning and much closer to baseline.   - Carb modified diet and encourage PO intake  - Avoid nephrotoxic agents  - Renally dose meds    Chronic Medical Conditions: Chronic pain: Home regimen of pregabalin 50 mg TID, percocet 10-325 QID prn, tizanadine 4 mg q8h prn and tylenol 500 mg BID prn  Continue pregabalin 50 mg TID   Asthma s/p complicated intubation and now trach dependent since 2018 Home regimen: Duelra 200-5  2 puff inhalation, BID and singulair 10 mg at bedtime  Continue home regimen    Diabetes Mellitus  Most recent A1c June 2024 was 10.3 >>> 8.3 on admission, home regimen Tresiba 170 units, Humalog 10units TID w/ meals, Mounjaro 12.5 mg once weekly. Bgs 160s-200s. Blood glucoses currently at goal and will continue regimen below:  - Hold home regmen given poor PO intake  - Moderate SSI  -  Continue Semglee 10 units for basal coverage, consider adjusting if tolerating more food  - BG checks w/ meals and at bedtime    Hypertension  Blood pressure ranging between 110s-130s/60-90 diastolic's.  Home regimen: lisinopril 20 mg daily and amlodipine 5 mg at bedtime  - Holding home meds   Depression, Anxiety  Home regimen bupropion 150 mg BID and sertraline 100 mg 2 tabs at night - Continue home meds     Migraines:  Home regimen: amitriptyline 10 mg at bedtime. Per chart review, patient was taking for migraine prophylaxis - Restart home medication today, to assist with headaches   Diet: Carb-Modified Bowel: Senna tablet prn  IVF: None VTE: Enoxaparin Code: Full PT/OT recs: Pending TOC recs: pending  Family Update: Gayla Doss Leandro Reasoner [phone]  Dispo: Anticipated discharge to  TBD  in 1-2 days pending tolerating PO and pain control optimized.   Peterson Ao, MD PGY-1 Psychiatry Resident Contact: secure message or page [phone] Please contact the on call pager after 5 pm and on weekends at [phone].

## 2023-07-01 DIAGNOSIS — R221 Localized swelling, mass and lump, neck: Secondary | ICD-10-CM | POA: Diagnosis not present

## 2023-07-01 DIAGNOSIS — R112 Nausea with vomiting, unspecified: Secondary | ICD-10-CM

## 2023-07-01 DIAGNOSIS — J4540 Moderate persistent asthma, uncomplicated: Secondary | ICD-10-CM | POA: Diagnosis not present

## 2023-07-01 DIAGNOSIS — R1010 Upper abdominal pain, unspecified: Secondary | ICD-10-CM | POA: Diagnosis not present

## 2023-07-01 DIAGNOSIS — K224 Dyskinesia of esophagus: Secondary | ICD-10-CM

## 2023-07-01 LAB — CBC
HCT: 36.9 % (ref 36.0–46.0)
Hemoglobin: 11.8 g/dL — ABNORMAL LOW (ref 12.0–15.0)
MCH: 27.8 pg (ref 26.0–34.0)
MCHC: 32 g/dL (ref 30.0–36.0)
MCV: 86.8 fL (ref 80.0–100.0)
Platelets: 357 10*3/uL (ref 150–400)
RBC: 4.25 MIL/uL (ref 3.87–5.11)
RDW: 13.9 % (ref 11.5–15.5)
WBC: 6.7 10*3/uL (ref 4.0–10.5)
nRBC: 0 % (ref 0.0–0.2)

## 2023-07-01 LAB — BASIC METABOLIC PANEL
Anion gap: 10 (ref 5–15)
BUN: 9 mg/dL (ref 6–20)
CO2: 24 mmol/L (ref 22–32)
Calcium: 9.5 mg/dL (ref 8.9–10.3)
Chloride: 104 mmol/L (ref 98–111)
Creatinine, Ser: 1.2 mg/dL — ABNORMAL HIGH (ref 0.44–1.00)
GFR, Estimated: 54 mL/min — ABNORMAL LOW (ref >60)
Glucose, Bld: 252 mg/dL — ABNORMAL HIGH (ref 70–99)
Potassium: 4.5 mmol/L (ref 3.5–5.1)
Sodium: 138 mmol/L (ref 135–145)

## 2023-07-01 LAB — HEPATIC FUNCTION PANEL
ALT: 23 U/L (ref 0–44)
AST: 19 U/L (ref 15–41)
Albumin: 3.5 g/dL (ref 3.5–5.0)
Alkaline Phosphatase: 106 U/L (ref 38–126)
Bilirubin, Direct: <0.1 mg/dL (ref 0.0–0.2)
Total Bilirubin: 0.5 mg/dL (ref 0.3–1.2)
Total Protein: 7.4 g/dL (ref 6.5–8.1)

## 2023-07-01 LAB — GLUCOSE, CAPILLARY
Glucose-Capillary: 250 mg/dL — ABNORMAL HIGH (ref 70–99)
Glucose-Capillary: 217 mg/dL — ABNORMAL HIGH (ref 70–99)
Glucose-Capillary: 222 mg/dL — ABNORMAL HIGH (ref 70–99)
Glucose-Capillary: 275 mg/dL — ABNORMAL HIGH (ref 70–99)

## 2023-07-01 LAB — LIPASE, BLOOD: Lipase: 36 U/L (ref 11–51)

## 2023-07-01 LAB — AMYLASE: Amylase: 43 U/L (ref 28–100)

## 2023-07-01 LAB — GAMMA GT: GGT: 39 U/L (ref 7–50)

## 2023-07-01 MED ORDER — OXYCODONE HCL 5 MG PO TABS
5.0000 mg | ORAL_TABLET | Freq: Four times a day (QID) | ORAL | Status: DC | PRN
  Administered 2023-07-01 – 2023-07-02 (×3): 5 mg via ORAL
  Filled 2023-07-01 (×3): qty 1

## 2023-07-01 MED ORDER — OXYCODONE-ACETAMINOPHEN 5-325 MG PO TABS
1.0000 | ORAL_TABLET | Freq: Four times a day (QID) | ORAL | Status: DC | PRN
  Administered 2023-07-01 – 2023-07-02 (×2): 1 via ORAL
  Filled 2023-07-01 (×2): qty 1

## 2023-07-01 MED ORDER — INSULIN GLARGINE-YFGN 100 UNIT/ML ~~LOC~~ SOLN
5.0000 [IU] | Freq: Once | SUBCUTANEOUS | Status: AC
  Administered 2023-07-01: 5 [IU] via SUBCUTANEOUS
  Filled 2023-07-01: qty 0.05

## 2023-07-01 MED ORDER — LISINOPRIL 20 MG PO TABS
20.0000 mg | ORAL_TABLET | Freq: Every day | ORAL | Status: DC
  Administered 2023-07-01 – 2023-07-02 (×2): 20 mg via ORAL
  Filled 2023-07-01 (×2): qty 1

## 2023-07-01 MED ORDER — HYDROXYZINE HCL 25 MG PO TABS
25.0000 mg | ORAL_TABLET | Freq: Three times a day (TID) | ORAL | Status: DC | PRN
  Administered 2023-07-01: 25 mg via ORAL
  Filled 2023-07-01: qty 1

## 2023-07-01 MED ORDER — PROCHLORPERAZINE EDISYLATE 10 MG/2ML IJ SOLN
5.0000 mg | INTRAMUSCULAR | Status: DC | PRN
  Administered 2023-07-01: 5 mg via INTRAVENOUS
  Filled 2023-07-01: qty 2

## 2023-07-01 MED ORDER — IPRATROPIUM-ALBUTEROL 0.5-2.5 (3) MG/3ML IN SOLN: 3.0000 mL | RESPIRATORY_TRACT | Status: DC | PRN

## 2023-07-01 MED ORDER — AMLODIPINE BESYLATE 5 MG PO TABS
5.0000 mg | ORAL_TABLET | Freq: Every day | ORAL | Status: DC
  Administered 2023-07-01 – 2023-07-02 (×2): 5 mg via ORAL
  Filled 2023-07-01 (×2): qty 1

## 2023-07-01 MED ORDER — METOCLOPRAMIDE HCL 5 MG/ML IJ SOLN
5.0000 mg | Freq: Four times a day (QID) | INTRAMUSCULAR | Status: DC
  Administered 2023-07-01: 5 mg via INTRAVENOUS
  Filled 2023-07-01: qty 2

## 2023-07-01 MED ORDER — ENOXAPARIN SODIUM 60 MG/0.6ML IJ SOSY
50.0000 mg | PREFILLED_SYRINGE | INTRAMUSCULAR | Status: DC
  Administered 2023-07-02: 50 mg via SUBCUTANEOUS
  Filled 2023-07-01: qty 0.6

## 2023-07-01 MED ORDER — INSULIN GLARGINE-YFGN 100 UNIT/ML ~~LOC~~ SOLN
15.0000 [IU] | Freq: Every day | SUBCUTANEOUS | Status: DC
  Administered 2023-07-02: 15 [IU] via SUBCUTANEOUS
  Filled 2023-07-01: qty 0.15

## 2023-07-01 MED ORDER — ONDANSETRON HCL 4 MG/2ML IJ SOLN
4.0000 mg | Freq: Four times a day (QID) | INTRAMUSCULAR | Status: DC | PRN
  Administered 2023-07-01: 4 mg via INTRAVENOUS
  Filled 2023-07-01: qty 2

## 2023-07-01 NOTE — Progress Notes (Addendum)
HD#2 Subjective:   Summary: [name] is a 52 y.o. female with a past medical history of hypertension, hyperlipidemia, chronic diastolic heart failure, OSA, chronic respiratory failure, severe persistent asthma s/p trach dependent, chronic pain, depression, anxiety and type 1 diabetes who presents for neck pain and swelling   Overnight Events: Emesis overnight. Received IV zofran without much relief.   Patient continues to report intermittent neck pain, this morning but is improving. Nausea and emesis persists with chronic epigastric pain. Still unable to keep food down. Patient amenable to trial of Reglan. Believes panic attack was due to being in an inclosed space beforehand and was not due to receiving reglan. Has been followed by GI before in the past and will be evaluated by them this morning. Patient disclosed that she takes her percocet every 4 hours.    Objective:  Vital signs in last 24 hours: Vitals:   07/01/23 0556 07/01/23 0759 07/01/23 0821 07/01/23 1009  BP: (!) 162/112  (!) 160/117 (!) 151/101  Pulse: (!) 107 (!) 112 (!) 113 (!) 114  Resp: 19 18 18    Temp: 98.5 F (36.9 C)  98.8 F (37.1 C) 97.7 F (36.5 C)  TempSrc: Oral  Oral Oral  SpO2: 99% 93% 91% 100%    There were no vitals filed for this visit.  Intake/Output Summary (Last 24 hours) at 07/01/2023 1123 Last data filed at 07/01/2023 0644 Gross per 24 hour  Intake 1300.13 ml  Output 400 ml  Net 900.13 ml   Net IO Since Admission: 3,421.92 mL [07/01/23 1123]   Physical exam Physical Exam Constitutional:      General: She is awake.     Appearance: Normal appearance. She is obese. She is not ill-appearing, toxic-appearing or diaphoretic.     Comments: Sitting up at bedside, this morning with husband on facetime    HENT:     Mouth/Throat:     Mouth: Mucous membranes are moist.     Dentition: Abnormal dentition. Dental caries present.      Comments: Fractured tooth #32, caries on  #17 Eyes:     Conjunctiva/sclera: Conjunctivae normal.  Neck:     Trachea: Tracheostomy present.     Comments: ROM limited due to pain,  not hot to the touch, neck appears symmetrical, tracheostomy in place since 2018 Cardiovascular:     Rate and Rhythm: Normal rate.  Pulmonary:     Effort: Pulmonary effort is normal.     Breath sounds: Normal breath sounds. No wheezing.  Abdominal:     General: Bowel sounds are normal. There is no distension.     Palpations: Abdomen is soft.     Tenderness: There is abdominal tenderness in the epigastric area. There is no guarding.     Comments: Chronic pain   Musculoskeletal:     Cervical back: Neck supple. No edema or erythema. Pain with movement and muscular tenderness present. Normal range of motion.  Neurological:     General: No focal deficit present.     Mental Status: She is alert.  Psychiatric:        Behavior: Behavior is cooperative.    Pertinent Labs:     Latest Ref Rng & Units 06/29/2023    8:48 AM 06/28/2023   10:35 AM 03/12/2023   12:59 AM  CBC  WBC 4.0 - 10.5 K/uL 6.8  7.9  7.7   Hemoglobin 12.0 - 15.0 g/dL 40.9  81.1  91.4   Hematocrit 36.0 - 46.0 %  36.0  37.7  31.4   Platelets 150 - 400 K/uL 346  353  249       Latest Ref Rng & Units 07/01/2023    4:48 AM 06/30/2023    4:37 AM 06/29/2023    8:48 AM  BMP  Glucose 70 - 99 mg/dL 161  096  045   BUN 6 - 20 mg/dL 9  14  15    Creatinine 0.44 - 1.00 mg/dL 4.09  8.11  9.14   Sodium 135 - 145 mmol/L 138  135  137   Potassium 3.5 - 5.1 mmol/L 4.5  4.0  4.3   Chloride 98 - 111 mmol/L 104  101  102   CO2 22 - 32 mmol/L 24  22  25    Calcium 8.9 - 10.3 mg/dL 9.5  9.2  9.0     Imaging:  CT Soft Tissue Neck Wo Contrast CLINICAL DATA:  Epiglottitis or tonsillitis suspected.  Neck pain  EXAM: CT NECK WITHOUT CONTRAST  TECHNIQUE: Multidetector CT imaging of the neck was performed following the standard protocol without intravenous contrast.  RADIATION DOSE REDUCTION: This  exam was performed according to the departmental dose-optimization program which includes automated exposure control, adjustment of the mA and/or kV according to patient size and/or use of iterative reconstruction technique.  COMPARISON:  06/16/2023  FINDINGS: Pharynx and larynx: Normal. No mass or swelling.  Salivary glands: No inflammation, mass, or stone.  Thyroid: Normal.  Lymph nodes: None enlarged or abnormal density.  Vascular: Negative.  Limited intracranial: Negative  Visualized orbits: Negative  Mastoids and visualized paranasal sinuses: Clear.  Skeleton: Unremarkable  Upper chest: Tracheostomy tube that is located. No regional inflammation is seen.  Other:  IMPRESSION: No explanation for symptoms. No visible inflammation in the neck.  Electronically Signed   By: Tiburcio Pea M.D.   On: 06/28/2023 12:36   Assessment/Plan:   Principal Problem:   AKI (acute kidney injury) (HCC)  Patient Summary: [name] is a 52 y.o. female with a past medical history of hypertension, hyperlipidemia, chronic diastolic heart failure, OSA, chronic respiratory failure, severe persistent asthma s/p trach dependent, chronic pain, depression, anxiety and type 1 diabetes who presents for neck pain and swelling who was admitted for AKI in the setting of limited PO intake.   Right sided neck pain and swelling  Neck pain ongoing for several weeks prior to admission with limited PO intake and swelling. On assessment today, no surrounding erythema, drainage, sign of infection or Sialorrhea noted on exam. CT imaging without any signs of inflammation, abscess or fracture. 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  Pain better controlled, with additions of IV robaxin and adjusted percocet to reflect home regimen, was previously q8h prn throughout this  hospitalization.  - Tylenol 650 mg q6h prn, oxycodone-acetaminophen 10-325 mg q6h prn, IV Robaxin 1000 mg IV q8h  - Cold and Heat compresses prn - Follow-up with [facility] for trach management outpatient   Nausea & Vomiting  Hx Gastritis Per chart review patient has been experiencing nausea since June 2024, requesting zofran from her PCP to help control symptoms. On assessment, this morning patient continues to have nausea, requiring IV zofran. Patient has been evaluated by [facility], most recently in March 2023 for chronic nausea, vomiting and upper abdominal pain with normal EGD, gastric emptying studies and CT scan. Upper endoscopy, with gastric biopsies  showed minimal inflmmation of esophagus, chronic gastritis w/ lymphoid aggregates and focal intestinal metaplasia. At that time, they recommended minimizing narcotics and suspected it could be the reason behind her GI symptoms. Colonscopy in 2021 with 13 polyps removed. Symptoms have be multifactoral with polypharmacy ( high dose sertraline, opioids, DM regimen, ?gastropersis) Discontinue zofran and switch to reglan for now. GI consulted and will evaluate for symptomatic intervention.   - Discontinue zofran - Start Reglan IV - Follow-up GI, recs for med adjustments, appreciate following   - Optimize pain regimen  - Carb modified diet and encourage PO intake  - Speech c/s: recs: suspect GERD, recs consulting GI     Acute Kidney Injury (resolved)  Patients Cr elevated at 2.28 and BUN of 22, suspect injury pre-renal given limited PO intake due to neck pain, nausea and emesis that past couple weeks. Cr 1.22 this morning and much closer to baseline.   - Carb modified diet and encourage PO intake  - Avoid nephrotoxic agents  - Renally dose meds    Chronic Medical Conditions: Chronic pain: Home regimen of pregabalin 50 mg TID, percocet 10-325 QID prn, tizanadine 4 mg q8h prn and tylenol 500 mg BID prn  Continue pregabalin 50 mg  TID, percocet 10-325 q6h prn Consider adding tizanidine when tolerating PO    Asthma s/p complicated intubation and now trach dependent since 2018 Home regimen: Duelra 200-5  2 puff inhalation, BID and singulair 10 mg at bedtime  Continue home regimen    Diabetes Mellitus  Most recent A1c June 2024 was 10.3 >>> 8.3 on admission, home regimen Tresiba 170 units, Humalog 10units TID w/ meals, Mounjaro 12.5 mg once weekly. Bgs 180s-250s. Adjusting insulin regimen below:  - Hold home regmen given poor PO intake  - Switched to Resistant SSI  - Increased Semglee 15 units for basal coverage, consider adjusting if tolerating more food  - BG checks w/ meals and at bedtime    Hypertension  Blood pressure ranging between 150s-180s/90-110s diastolic's.  Home regimen: lisinopril 20 mg daily and amlodipine 5 mg at bedtime  - Restart home amlodipine 5 mg every day  - Restart home lisinopril 20 mg everyday    Depression, Anxiety  Home regimen bupropion 150 mg BID and sertraline 100 mg 2 tabs at night - Continue home meds     Migraines:  Home regimen: amitriptyline 10 mg at bedtime. Per chart review, patient was taking for migraine prophylaxis - Restart home medication, to assist with headaches   Diet: Carb-Modified Bowel: Senna tablet prn  IVF: None VTE: Enoxaparin Code: Full PT/OT recs: Pending TOC (10/8) recs: no needs or recommendations currently  Family Update: Gayla Doss Leandro Reasoner [phone]  Dispo: Anticipated discharge to  TBD  in 1-2 days pending tolerating PO and pain control optimized.   Peterson Ao, MD PGY-1 Psychiatry Resident Contact: secure message or page [phone] Please contact the on call pager after 5 pm and on weekends at [phone].

## 2023-07-01 NOTE — Plan of Care (Signed)
  Problem: Clinical Measurements: °Goal: Will remain free from infection °Outcome: Completed/Met °  °

## 2023-07-01 NOTE — Consult Note (Addendum)
Consultation  Primary Care Physician:  Hoy Register, MD Primary Gastroenterologist:  Dr. Marina Goodell       Reason for Consultation: Nausea and vomiting DOA: 06/28/2023         Hospital Day: 4         HPI:   [name] is a 52 y.o. female with past medical history significant for DM 1,chronic nausea, Depression, Migraines, CKD, Hypertension, Subglottic stenosis, HTN, severe asthma, Degenerative spinal disease, Status post lumbar surgery,  Right hip bursitis, OSA, Obesity, and S/p cholecystectomy.  9/19 cardiac PET perfusion study with unremarkable result, chronic respiratory failure with hypoxia on 3 L nocturnal oxygen, tracheostomy 2018 due to traumatic intubation from asthma exacerbation.  Patient presented to ED on 10/6 for complaints of pain adjacent to her trach with associated poor oral intake, nausea vomiting, and epigastric abdominal pain.  GI consulted for nausea and vomiting.  Work up notable for BUN 9./Creatinine 1.20.  Hgb 11.6. Normal WBC 6.8.  Platelets 346.  HA1C 8.9.  Urinalysis with moderate leukocytes. Systolic BP 181/106.CT neck without contrast no no explanation for symptoms.  No visible inflammation in the neck.  12/25/2021 Colonoscopy with Dr. Marina Goodell. (See full report below) 10/15/2021 upper GI endoscopy with Dr. Orvan Falconer for abdominal pain coffee-ground emesis nausea with vomiting. (See full report below       Patient states has had at least 3 to 4 years of intermittent nausea, vomiting abdominal discomfort states last 2 to 3 weeks have been different with reported abdominal spasms with associated nausea and vomiting.      Patient lying in bed, very tearful due to that she describes as epigastric/abdominal spasming that radiates to her right side and back.  Has chronic nausea and was taking ondansetron 3 times per day scheduled at home is not improving her symptoms. Reports poor appetite and full satiety.  No significant weight loss.  She reports that when  she consumes solids it feels as if it is stuck in the oropharyngeal and esophageal region.  Liquids seem to pass more easily.  Patient does take home pantoprazole 40 mg p.o. daily for history of GERD.  Patient also reports she started [facility] about 3 months ago, her last dose was about 2 weeks ago.  Also taking pain medication oxycodone 10mg  every 4 hours scheduled for chronic pain. Reports having 1-2 bowel movements per day that is unformed and brown.  She did have 1 week of black stool that since has resolved.  No NSAID use.  No iron.  Reports 1 dose of Pepto-Bismol that came back up.  Occasional glass of wine.  No smoking or IV drug use.  No fever or chills. Admits to intermittent dizziness.  Denies vertigo.  Reports that she had a severe migraine about a week ago and this sometimes correlates with her abdominal pain.  Reports right neck pain adjacent to the trach site worse with movement.    No family was present at the time of my evaluation.   Previous GI workup:   12/25/2021 Colonoscopy with Dr. Marina Goodell. There were six 2-6 millimeter polyps in the descending colon, transverse colon, and the ascending colon, removed with cold snare. examination was otherwise normal.  Diagnosis 1. Surgical [P], colon, transverse and ascending, polyp (4) - TUBULAR ADENOMAS, MULTIPLE ADENOMATOUS FRAGMENTS, NEGATIVE FOR HIGH-GRADE DYSPLASIA. - MULTIPLE FRAGMENTS OF SERRATED MUCOSAL POLYP(S), INDEFINITE FOR SESSILE SERRATED POLYP, NEGATIVE FOR DYSPLASIA. 2. Surgical [P], colon, descending, polyp (2) - TUBULAR ADENOMAS, NEGATIVE FOR  HIGH-GRADE DYSPLASIA.  10/15/2021 upper GI endoscopy with Dr. Orvan Falconer for abdominal pain coffee-ground emesis nausea with vomiting. Normal esophagus, congested mucosa and pylorus, Bile in the stomach, mild duodenal erythema.  Examination otherwise normal.   10/18/21 Upper GI series Revealed barium tablet arrested in distal esophagus and did not pass into the stomach desist despite  administration of additional barium.  A mild stricture is not excluded.  No evidence of hiatal hernia or gastric outlet obstruction.  Mild nonspecific esophageal dysmotility  12/2020 Normal gastric emptying study   Abnormal ED labs: Abnormal Labs Reviewed  BASIC METABOLIC PANEL - Abnormal; Notable for the following components:      Result Value   Glucose, Bld 204 (*)    BUN 22 (*)    Creatinine, Ser 2.28 (*)    GFR, Estimated 25 (*)    All other components within normal limits  HEMOGLOBIN A1C - Abnormal; Notable for the following components:   Hgb A1c MFr Bld 8.9 (*)    All other components within normal limits  BASIC METABOLIC PANEL - Abnormal; Notable for the following components:   Glucose, Bld 269 (*)    Creatinine, Ser 1.37 (*)    GFR, Estimated 46 (*)    All other components within normal limits  CBC - Abnormal; Notable for the following components:   Hemoglobin 11.6 (*)    All other components within normal limits  GLUCOSE, CAPILLARY - Abnormal; Notable for the following components:   Glucose-Capillary 115 (*)    All other components within normal limits  GLUCOSE, CAPILLARY - Abnormal; Notable for the following components:   Glucose-Capillary 134 (*)    All other components within normal limits  GLUCOSE, CAPILLARY - Abnormal; Notable for the following components:   Glucose-Capillary 184 (*)    All other components within normal limits  GLUCOSE, CAPILLARY - Abnormal; Notable for the following components:   Glucose-Capillary 220 (*)    All other components within normal limits  GLUCOSE, CAPILLARY - Abnormal; Notable for the following components:   Glucose-Capillary 161 (*)    All other components within normal limits  BASIC METABOLIC PANEL - Abnormal; Notable for the following components:   Glucose, Bld 206 (*)    Creatinine, Ser 1.45 (*)    GFR, Estimated 43 (*)    All other components within normal limits  GLUCOSE, CAPILLARY - Abnormal; Notable for the following  components:   Glucose-Capillary 210 (*)    All other components within normal limits  GLUCOSE, CAPILLARY - Abnormal; Notable for the following components:   Glucose-Capillary 205 (*)    All other components within normal limits  GLUCOSE, CAPILLARY - Abnormal; Notable for the following components:   Glucose-Capillary 186 (*)    All other components within normal limits  GLUCOSE, CAPILLARY - Abnormal; Notable for the following components:   Glucose-Capillary 219 (*)    All other components within normal limits  BASIC METABOLIC PANEL - Abnormal; Notable for the following components:   Glucose, Bld 252 (*)    Creatinine, Ser 1.20 (*)    GFR, Estimated 54 (*)    All other components within normal limits  GLUCOSE, CAPILLARY - Abnormal; Notable for the following components:   Glucose-Capillary 243 (*)    All other components within normal limits  GLUCOSE, CAPILLARY - Abnormal; Notable for the following components:   Glucose-Capillary 250 (*)    All other components within normal limits    Past Medical History:  Diagnosis Date   Abnormal UGI series  Acute pain of right shoulder due to trauma 10/05/2018   Acute respiratory failure with hypoxia and hypercapnia (HCC) 10/23/2018   Allergy    Anemia    Anxiety    Arthritis    Asthma    Chest pain of uncertain etiology 03/22/2019   Chronic back pain    Chronic chest pain    resolved, no problems since 2019 per patient 07/27/19   Coffee ground vomiting 10/14/2021   COPD (chronic obstructive pulmonary disease) (HCC)    inhaler   Depression    DM (diabetes mellitus) (HCC)    INSULIN DEPENDENT - TYPE 1   Dyspnea    GERD (gastroesophageal reflux disease)    Headache(784.0)    History of hiatal hernia    Hyperlipidemia    no med, diet controlled   Hypertension    Hypokalemia    Klebsiella cystitis 03/14/2017   Last Assessment & Plan:   Formatting of this note might be different from the original.  - On Zosyn  - Urine culture:  Klebsiella pneumoniae   Neuromuscular disorder (HCC)    Osteoarthritis    Oxygen deficiency    Pneumonia    Respiratory disease 05/2017   uses inhalers, neb tx prn, no oxygen   Rotator cuff tear 07/28/2019   Sleep apnea    does not use CPAP due to trach, uses 2L supplement oxygen at night   Status post tracheostomy (HCC) 03/14/2017   Last Assessment & Plan:  Formatting of this note might be different from the original. - VERY CRITICAL AIRWAY WITH EXTREME PRECUATION - On tach - ENT following along (please see above for current trach management plan)   Tracheostomy in place [facility]) 02/2017    Surgical History:  She  has a past surgical history that includes Cesarean section; Appendectomy; Vesicovaginal fistula closure w/ TAH (2009); Hernia repair; Cholecystectomy (N/A, 03/02/2014); Rotator cuff repair (Left); Panendoscopy (N/A, 03/04/2017); Tracheostomy (02/2017); Abdominal hysterectomy (2009); Rotator cuff repair (Right, 07/27/2019); Shoulder arthroscopy with subacromial decompression, rotator cuff repair and bicep tendon repair (Right, 07/28/2019); Tubal ligation; Tooth Extraction (N/A, 02/22/2021); Esophagogastroduodenoscopy (egd) with propofol (N/A, 10/15/2021); biopsy (10/15/2021); and Spine surgery. Family History:  Her family history includes Arthritis in her mother; Asthma in her brother, brother, daughter, grandson, sister, sister, and son; Diabetes in her brother, mother, and sister; Heart attack in her mother; Hypertension in her mother and sister; Seizures in her brother; Stroke in her father and mother. Social History:   reports that she quit smoking about 7 years ago. Her smoking use included cigarettes. She started smoking about 32 years ago. She has a 12.5 pack-year smoking history. She has never used smokeless tobacco. She reports current alcohol use of about 1.0 standard drink of alcohol per week. She reports that she does not use drugs.  Prior to Admission medications    Medication Sig Start Date End Date Taking? Authorizing Provider  acetaminophen (TYLENOL) 500 MG tablet Take 1,000 mg by mouth 2 (two) times daily as needed for moderate pain, fever or headache.   Yes [provider]  albuterol (PROVENTIL) (2.5 MG/3ML) 0.083% nebulizer solution Take 3 mLs (2.5 mg total) by nebulization every 6 (six) hours as needed for wheezing or shortness of breath. 12/26/22  Yes Newlin, Odette Horns, MD  albuterol (VENTOLIN HFA) 108 (90 Base) MCG/ACT inhaler INHALE 2 PUFFS BY MOUTH EVERY 6 HOURS AS NEEDED FOR WHEEZING FOR SHORTNESS OF BREATH Patient taking differently: Inhale 2 puffs into the lungs every 6 (six) hours as needed for  shortness of breath or wheezing. 02/03/23  Yes Hoy Register, MD  amitriptyline (ELAVIL) 10 MG tablet Take 1 tablet (10 mg total) by mouth at bedtime. 06/16/23  Yes Storm Frisk, MD  amLODipine (NORVASC) 5 MG tablet Take 1 tablet (5 mg total) by mouth daily. 01/22/23 06/28/23 Yes Meriam Sprague, MD  buPROPion (WELLBUTRIN SR) 150 MG 12 hr tablet Take 1 tablet (150 mg total) by mouth 2 (two) times daily. 01/15/23  Yes Hoy Register, MD  cetirizine (ZYRTEC) 10 MG tablet TAKE 1 TABLET BY MOUTH AT BEDTIME 12/10/21  Yes Charlott Holler, MD  dapagliflozin propanediol (FARXIGA) 10 MG TABS tablet Take 1 tablet (10 mg total) by mouth daily before breakfast. 01/15/23  Yes Hoy Register, MD  furosemide (LASIX) 40 MG tablet Take 1 tablet (40 mg total) by mouth daily as needed for fluid or edema. 03/12/23  Yes Almon Hercules, MD  insulin degludec (TRESIBA FLEXTOUCH) 200 UNIT/ML FlexTouch Pen Inject 170 Units into the skin daily. 03/10/23  Yes Newlin, Odette Horns, MD  insulin lispro (HUMALOG KWIKPEN) 100 UNIT/ML KwikPen Inject 10 Units into the skin 3 (three) times daily. Patient taking differently: Inject 10 Units into the skin with breakfast, with lunch, and with evening meal. 01/15/23  Yes Newlin, Enobong, MD  lisinopril (ZESTRIL) 20 MG tablet Take 1 tablet (20  mg total) by mouth daily. 03/19/23  Yes Almon Hercules, MD  melatonin 3 MG TABS tablet Take 1 tablet (3 mg total) by mouth at bedtime. 06/20/22  Yes Newlin, Odette Horns, MD  mometasone-formoterol (DULERA) 200-5 MCG/ACT AERO Inhale 2 puffs into the lungs in the morning and at bedtime. 01/15/23  Yes Newlin, Odette Horns, MD  montelukast (SINGULAIR) 10 MG tablet Take 1 tablet (10 mg total) by mouth at bedtime. 01/15/23  Yes Hoy Register, MD  ondansetron (ZOFRAN) 4 MG tablet Take 1 tablet (4 mg total) by mouth every 8 (eight) hours as needed for nausea or vomiting. 06/11/23  Yes Hoy Register, MD  oxyCODONE-acetaminophen (PERCOCET) 10-325 MG tablet Take 1 tablet by mouth 4 (four) times daily as needed for pain. 11/22/21  Yes [provider]  pantoprazole (PROTONIX) 40 MG tablet Take 1 tablet (40 mg total) by mouth 2 (two) times daily. 01/15/23  Yes Newlin, Odette Horns, MD  pregabalin (LYRICA) 50 MG capsule TAKE 1 CAPSULE BY MOUTH THREE TIMES DAILY IN  THE  MORNING,  AT  NOON,  AND  AT  BEDTIME Patient taking differently: Take 50 mg by mouth 3 (three) times daily. 04/27/23  Yes London Sheer, MD  rosuvastatin (CRESTOR) 20 MG tablet Take 1 tablet by mouth once daily 04/27/23  Yes Newlin, Enobong, MD  sertraline (ZOLOFT) 100 MG tablet Take 2 tablets (200 mg total) by mouth at bedtime. 04/06/23  Yes Hoy Register, MD  tiZANidine (ZANAFLEX) 4 MG tablet Take 1 tablet (4 mg total) by mouth every 8 (eight) hours as needed for muscle spasms. 06/02/23  Yes Hoy Register, MD  Blood Glucose Monitoring Suppl (ONETOUCH VERIO FLEX SYSTEM) w/Device KIT Use to check glucose levels daily 06/05/23   Hoy Register, MD  Continuous Glucose Receiver (FREESTYLE LIBRE 2 READER) DEVI USE TO CHECK BLOOD SUGAR THREE TIMES DAILY. 06/01/23   Hoy Register, MD  Continuous Glucose Sensor (FREESTYLE LIBRE 2 SENSOR) MISC USE TO CHECK BLOOD GLUCOSE THREE TIMES DAILY.  CHANGE SENSOR ONCE EVERY 14 DAYS. 06/10/23   Hoy Register, MD  glucose blood  (ONETOUCH VERIO) test strip Use to check glucose levels daily; E11.9 01/09/23  Hoy Register, MD  Insulin Pen Needle 31G X 8 MM MISC Use to inject [place] and Humalog. Pt Bradley use 4 pen needles daily. 11/07/22   Hoy Register, MD  Misc. Devices MISC Shower chair 04/03/21   Hoy Register, MD  Misc. Devices MISC Nebulizer device. Diagnosis - Asthma 04/06/23   Hoy Register, MD  OneTouch Delica Lancets 33G MISC Use to check glucose levels daily; E11.9 01/09/23   Hoy Register, MD  tirzepatide [facility]) 12.5 MG/0.5ML Pen Inject 12.5 mg into the skin once a week. For 4 weeks then increase to 15 mg weekly thereafter. 06/11/23   Hoy Register, MD  fluticasone-salmeterol (ADVAIR HFA) 230-21 MCG/ACT inhaler Inhale 2 puffs into the lungs 2 (two) times daily. 10/15/17 10/15/17  Hoy Register, MD    Current Facility-Administered Medications  Medication Dose Route Frequency Provider Last Rate Last Admin   acetaminophen (TYLENOL) tablet 650 mg  650 mg Oral Q6H PRN Rana Snare, DO   650 mg at 06/30/23 1710   Or   acetaminophen (TYLENOL) suppository 650 mg  650 mg Rectal Q6H PRN Rana Snare, DO       amitriptyline (ELAVIL) tablet 10 mg  10 mg Oral QHS Peterson Ao, MD   10 mg at 06/30/23 2122   amLODipine (NORVASC) tablet 5 mg  5 mg Oral Daily Peterson Ao, MD   5 mg at 07/01/23 6962   buPROPion (WELLBUTRIN SR) 12 hr tablet 150 mg  150 mg Oral BID Peterson Ao, MD   150 mg at 07/01/23 0828   [START ON 07/02/2023] enoxaparin (LOVENOX) injection 50 mg  50 mg Subcutaneous Q24H Reymundo Poll, MD       insulin aspart (novoLOG) injection 0-20 Units  0-20 Units Subcutaneous TID WC Rana Snare, DO   7 Units at 07/01/23 0826   [START ON 07/02/2023] insulin glargine-yfgn (SEMGLEE) injection 15 Units  15 Units Subcutaneous Daily Peterson Ao, MD       insulin glargine-yfgn (SEMGLEE) injection 5 Units  5 Units Subcutaneous Once Peterson Ao, MD       ipratropium-albuterol (DUONEB) 0.5-2.5  (3) MG/3ML nebulizer solution 3 mL  3 mL Nebulization Q4H PRN Reymundo Poll, MD       lidocaine (XYLOCAINE) 2 % viscous mouth solution 15 mL  15 mL Mouth/Throat Q6H PRN Rana Snare, DO       lisinopril (ZESTRIL) tablet 20 mg  20 mg Oral Daily Peterson Ao, MD   20 mg at 07/01/23 9528   methocarbamol (ROBAXIN) 1,000 mg in dextrose 5 % 100 mL IVPB  1,000 mg Intravenous Allen Derry, MD 220 mL/hr at 07/01/23 0609 1,000 mg at 07/01/23 0609   mometasone-formoterol (DULERA) 200-5 MCG/ACT inhaler 2 puff  2 puff Inhalation BID Peterson Ao, MD   2 puff at 07/01/23 0758   montelukast (SINGULAIR) tablet 10 mg  10 mg Oral QHS Peterson Ao, MD   10 mg at 06/30/23 2117   ondansetron (ZOFRAN) injection 4 mg  4 mg Intravenous Q6H PRN Monna Fam, MD   4 mg at 07/01/23 4132   oxyCODONE-acetaminophen (PERCOCET/ROXICET) 5-325 MG per tablet 1 tablet  1 tablet Oral Q8H PRN Rana Snare, DO   1 tablet at 07/01/23 0448   And   oxyCODONE (Oxy IR/ROXICODONE) immediate release tablet 5 mg  5 mg Oral Q8H PRN Rana Snare, DO   5 mg at 07/01/23 0448   pantoprazole (PROTONIX) EC tablet 40 mg  40 mg Oral BID Peterson Ao, MD   40 mg at 07/01/23 [id number]  pregabalin (LYRICA) capsule 50 mg  50 mg Oral TID Peterson Ao, MD   50 mg at 07/01/23 4034   senna-docusate (Senokot-S) tablet 1 tablet  1 tablet Oral QHS PRN Rana Snare, DO       sertraline (ZOLOFT) tablet 200 mg  200 mg Oral QHS Peterson Ao, MD   200 mg at 06/30/23 2117    Allergies as of 06/28/2023 - Review Complete 06/28/2023  Allergen Reaction Noted   Reglan [metoclopramide] Other (See Comments) 06/04/2014    Review of Systems:    Constitutional: No weight loss, fever, chills, weakness or fatigue HEENT: Eyes: No change in vision               Ears, Nose, Throat:  No change in hearing or congestion Skin: No rash or itching Cardiovascular: No chest pain, chest pressure or palpitations   Respiratory: No SOB or  cough Gastrointestinal: See HPI and otherwise negative Genitourinary: No dysuria or change in urinary frequency Neurological: No headache, dizziness or syncope Musculoskeletal: No new muscle or joint pain Hematologic: No bleeding or bruising Psychiatric: No history of depression or anxiety     Physical Exam:  Vital signs in last 24 hours: Temp:  [97.7 F (36.5 C)-98.8 F (37.1 C)] 97.7 F (36.5 C) (10/09 1009) Pulse Rate:  [82-114] 114 (10/09 1009) Resp:  [16-19] 18 (10/09 0821) BP: (151-181)/(101-117) 151/101 (10/09 1009) SpO2:  [91 %-100 %] 100 % (10/09 1009) FiO2 (%):  [21 %] 21 % (10/09 0330) Last BM Date : 06/30/23 Last BM recorded by nurses in past 5 days No data recorded  General:   Pleasant, well developed female in no acute distress Head:  Normocephalic and atraumatic. Eyes: sclerae anicteric,conjunctive pink  Heart:  tachycardic rate and rhythm Pulm: diminished lung sounds lwoer lobes, whezzing upper lobes, on 3L via trach Abdomen:  Soft, Obese AB, Hypoactive bowel sounds. moderate tenderness in the epigastrium. Without guarding and Without rebound, No organomegaly appreciated. Extremities:  With trace bilateral lower extremity edema. Msk:  Symmetrical without gross deformities. Peripheral pulses intact.  Neurologic:  Alert and  oriented x4;  No focal deficits.  Skin:   Dry and intact without significant lesions or rashes. Psychiatric:  Cooperative. Normal mood and affect.  LAB RESULTS: Recent Labs    06/28/23 1035 06/29/23 0848  WBC 7.9 6.8  HGB 12.0 11.6*  HCT 37.7 36.0  PLT 353 346   BMET Recent Labs    06/29/23 0848 06/30/23 0437 07/01/23 0448  NA 137 135 138  K 4.3 4.0 4.5  CL 102 101 104  CO2 25 22 24   GLUCOSE 269* 206* 252*  BUN 15 14 9   CREATININE 1.37* 1.45* 1.20*  CALCIUM 9.0 9.2 9.5   LFT No results for input(s): "PROT", "ALBUMIN", "AST", "ALT", "ALKPHOS", "BILITOT", "BILIDIR", "IBILI" in the last 72 hours. PT/INR No results for  input(s): "LABPROT", "INR" in the last 72 hours.  STUDIES: No results found.    Impression /Plan:  52 year old [place] American female patient that presents with history of chronic abdominal pain, nausea, and vomiting presents to ED with worsening abdominal pain, nausea and vomiting for the past 2 to 3 weeks.  Acute on chronic epigastric/abdominal pain that radiates to right side with nausea and vomiting.  Not relieved with scheduled ondansetron 3 times per day.  Patient on chronic pain medication Oxycodone 10 mg every 4 hours.  Started new medication Mounjaro approximately 3 months ago. Multifactorial possible causes due to starting [facility] recently, chronic narcotic use,  chronic disease, recent migraine episodes.  -Check liver profile, amylase, lipase, and GGT -Continue pantoprazole 40 mg p.o. twice daily -Continue antiemetics as needed -Okay with scheduled metoclopramide 5 mg -Suggest stopping Mounjaro due to reported SE's -Counseled on how long term narcotics can affect motility of the gut. -endoscopy most likely low yield at this point  Oropharyngeal/esophageal dysphagia, most likely related to dysmotility issue.  Upper endoscopy 10/15/2021 showed no indication for dysphagia.  Upper GI series 10/18/2021 showed esophageal dysmotility.  Right neck pain adjacent to trach site.  CT neck without contrast shows no explanation for symptoms no visible inflammation in the neck. Pain worse with movement, suspect its more related to MSK etiology.   Acute on chronic anemia due to chronic illness/CKD . Hgb 11.6.  Baseline 11-12.  Reports 1 week of dark stool that has since then resolved.Colonoscopy 12/25/2021 revealed multiple tubular adenomas. -Check CBC  Fatty liver- found on previous abd u/s  Severe asthma s/p trach dependent- on 3L N/C  Chronic pain- on oxycodone 10mg  every 4hours, lyrica 50mg  TID  Depression/anxiety- on Wellbutrin, Zoloft  DM type 1. On Mounjaro at home. -Suggest stopping  Mounjaro due to reported SE's -SSI   Principal Problem:   AKI (acute kidney injury) (HCC)    LOS: 2 days   Thank you for your kind consultation, we will continue to follow.  [name]  07/01/2023, 10:29 AM   I have taken an interval history, thoroughly reviewed the chart and examined the patient. I agree with the Advanced Practitioner's note, impression and recommendations, and have recorded additional findings, impressions and recommendations below. I performed a substantive portion of this encounter (>50% time spent), including a complete performance of the medical decision making.  My additional thoughts are as follows:  Complex patient, extensive review of prior GI workup and other records performed.  I personally reviewed images from the most recent abdominal CT angiogram in June of this year and a prior upper GI series.  My impression is that she has multifactorial neck pain that seems largely related to her tracheostomy.  She has chronic upper digestive symptoms that seem to come in episodes over the last several years and they Bradley last a week or more with severe crampy upper abdominal pain and nausea with inability to keep down nutrition.  Previous extensive endoscopic and radiographic workup was unrevealing except for a nonspecific esophageal motility disorder on the last upper GI series.  No GERD was seen on the last upper GI series, but the esophageal motility problem is very clearly causing reflux from the esophagus to the larynx.  Previous imaging and endoscopy findings have not been typical for achalasia, and she is apparently able to swallow liquids without difficulty.  Symptoms Bradley have been more worse recently since she started [facility] a few months back with last dose 10 to 14 days ago.  Overall, I suspect this patient has both an esophageal and probably gastric motility disorder at least partially related to her need for chronic opioid therapy.  She had a prior  cholecystectomy, I do not see any ductal dilatation on last CT angiogram.  Alkaline phosphatase in July was mildly elevated, so we will repeat that along with a GGT.  Exam not consistent with bowel obstruction, and I am doubtful that she has developed something to cause gastric outlet obstruction.  Overall, I feel the yield of a repeat upper endoscopy would be low, and the risks are unquestionably increased due to her medical comorbidities.  I  agree with a trial of metoclopramide which, if it works, could be used for a few weeks and then episodically thereafter when these issues arise.  No other GI specific testing planned during this admission.  Inpatient GI service will sign off, but help arrange follow-up with Dr. Marina Goodell, who is her primary GI physician at our office.  Charlie Pitter III [phone]

## 2023-07-01 NOTE — Hospital Course (Signed)
[name] is a 52 y.o. female with a past medical history of hypertension, hyperlipidemia, chronic diastolic heart failure, OSA, chronic respiratory failure, severe persistent asthma s/p trach dependent, chronic pain, depression, anxiety and type 1 diabetes who presents for neck pain and swelling who was admitted for AKI in the setting of limited PO intake.    Right sided neck pain and swelling  Neck pain ongoing for several weeks prior to admission with limited PO intake and swelling. On assessment today, no surrounding erythema, drainage, sign of infection or Sialorrhea noted on exam. CT imaging without any signs of inflammation, abscess or fracture. 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  Pain regimen was adjusted to optimize pain with Tylenol 650 mg q6h prn, oxycodone-acetaminophen 10-325 mg q6h prn, IV Robaxin 1000 mg IV q8h. She discontinued her IV Robaxin and transitioned to her home Tinzanadine prior to discharge. Patient was encouraged to follow-up with [facility] for trach management outpatient.    Nausea & Vomiting  Hx Gastritis Upon presentation, the patient reported several weeks of nausea and vomiting at home.  Patient was started on IV zofran and symptoms were uncontrolled. Speech pathology was evaluated to evaluate swallowing and suspected GERD. GI was consulted for symptoms and suspected, symptoms may have be multifactoral with chronic opioid use and recently starting manjuro 3 months prior. Amylase, lipase, gamma GT and hepatic panel were normal. They signed off and wanted further follow-up outpatient. The patients nausea was controlled on IV compazine. Patient was transitioned to oral dissolvable tablet of zofran, after transitioning off IV anti-emetics. Prior to discharge, the patients nausea was improved and she was educated on advancing diet as tolerable.  She was educated to take zofran medication prior to attempting to eat or take oral medications and allow medicine to optimize symptom control. Prior to discharge she was tolerating fluids and was encouraged to advance diet gradually.   Acute Kidney Injury (resolved)  Patients Cr elevated at 2.28 and BUN of 22, suspect injury pre-renal given limited PO intake due to neck pain, nausea and emesis that past couple weeks. Patient was given fluids for resolution and her kidney function improved. Last Cr obtained was 1.20 at discharge at baseline.      Chronic Medical Conditions: Chronic pain: Home regimen of pregabalin 50 mg TID, percocet 10-325 QID prn, tizanadine 4 mg q8h. She was restarted on all her medications prior to discharge. Discussed with the patient the possibility that her chronic opioid can also play a role in her gastroparesis and persistent nausea and vomiting.     Asthma s/p complicated intubation and now trach dependent since 2018 Patient has a prior history of severe persistent asthma. She had a severe attack and complicated intubation in 2018, requiring tracheostomy. Her home regimen was Duelra 200-5 2 puff inhalation, BID and singulair 10 mg at bedtime. She continued on her home regimen throughout hospitalization.    Diabetes Mellitus  A1c was 8.3 on admission. Patients home regimen Tresiba 170 units, Humalog 10units TID w/ meals, Mounjaro 12.5 mg once weekly. Bgs 180s-250s. Her home regimen was held and she was started on semglee 10 units and sensitive sliding scale insulin. To optimize blood glucose control, she transitioned to moderate sliding scale insulin amd increased Semglee 15 units for basal coverage. GI suspected her nausea was multifactorial with narcotics and diabetes control. They recommended discontinuing home mounjaro 12.5. Our pharmacy  colleagues, recommended discontinuing Marcelline Deist to less the likelihood of DKA. She was discharged home with the following regimen: Tresiba 20  units daily and Humalog 10 units TID w/ meals. She monitors her glucose at home to avoid hyperglycemic and hypoglycemic. She was encouraged to discuss her medications further with her primary care physician for further adjustments outpatient.    Hypertension  Patient has a prior history with a lisinopril 20 mg daily and amlodipine 5 mg at bedtime. She was restarted on her home regimen to control her blood pressure. She will continue to discuss her regiment with her primary care provider.   Depression, Anxiety  Patient has a prior history, with a home regimen bupropion 150 mg BID and sertraline 100 mg 2 tabs at night. She continued on that regimen throughout admission. She was provided with outpatient resources if she wanted to establish with a psychiatric provider for further med management and therapy services.     Migraines:  Per chart review, patient was taking for migraine prophylaxis, and restarted home medication to assist with headaches.    Necrotic Tooth and dental caries  Per EMR patient was evaluated August 2024 for a fractured tooth and gross tooth decay. At that time, they told her due to severe generalized dental anxiety and her diagnosis of tracheitis, she would not be a candidate for an IV sedation and extraction would need to be performed in the OR, with a referral placed. She was encouraged to follow-up with dentists at discharge to address oral health.

## 2023-07-01 NOTE — Progress Notes (Signed)
   07/01/23 0821  Assess: MEWS Score  Temp 98.8 F (37.1 C)  BP (!) 160/117  MAP (mmHg) 129  Pulse Rate (!) 113  Resp 18  SpO2 91 %  O2 Device Room Air  Assess: MEWS Score  MEWS Temp 0  MEWS Systolic 0  MEWS Pulse 2  MEWS RR 0  MEWS LOC 0  MEWS Score 2  MEWS Score Color Yellow  Assess: if the MEWS score is Yellow or Red  Were vital signs accurate and taken at a resting state? Yes  Does the patient meet 2 or more of the SIRS criteria? No  MEWS guidelines implemented  Yes, yellow  Treat  MEWS Interventions Considered administering scheduled or prn medications/treatments as ordered  Take Vital Signs  Increase Vital Sign Frequency  Yellow: Q2hr x1, continue Q4hrs until patient remains green for 12hrs  Escalate  MEWS: Escalate Yellow: Discuss with charge nurse and consider notifying provider and/or RRT  Notify: Charge Nurse/RN  Name of Charge Nurse/RN Notified Nurse, mental health  Assess: SIRS CRITERIA  SIRS Temperature  0  SIRS Pulse 1  SIRS Respirations  0  SIRS WBC 0  SIRS Score Sum  1

## 2023-07-02 ENCOUNTER — Other Ambulatory Visit (HOSPITAL_BASED_OUTPATIENT_CLINIC_OR_DEPARTMENT_OTHER): Payer: Self-pay

## 2023-07-02 ENCOUNTER — Ambulatory Visit: Payer: 59 | Admitting: Nurse Practitioner

## 2023-07-02 ENCOUNTER — Other Ambulatory Visit (HOSPITAL_COMMUNITY): Payer: Self-pay

## 2023-07-02 LAB — GLUCOSE, CAPILLARY
Glucose-Capillary: 207 mg/dL — ABNORMAL HIGH (ref 70–99)
Glucose-Capillary: 247 mg/dL — ABNORMAL HIGH (ref 70–99)

## 2023-07-02 MED ORDER — ONDANSETRON 4 MG PO TBDP
4.0000 mg | ORAL_TABLET | Freq: Three times a day (TID) | ORAL | Status: DC | PRN
  Administered 2023-07-02: 4 mg via ORAL
  Filled 2023-07-02: qty 1

## 2023-07-02 MED ORDER — PROCHLORPERAZINE MALEATE 5 MG PO TABS: 5.0000 mg | ORAL_TABLET | ORAL | Status: DC | PRN

## 2023-07-02 MED ORDER — ONDANSETRON 4 MG PO TBDP: 4.0000 mg | ORAL_TABLET | Freq: Four times a day (QID) | ORAL | Status: DC

## 2023-07-02 MED ORDER — TRESIBA FLEXTOUCH 200 UNIT/ML ~~LOC~~ SOPN: 20.0000 [IU] | PEN_INJECTOR | Freq: Every day | SUBCUTANEOUS | Status: DC

## 2023-07-02 MED ORDER — ONDANSETRON 4 MG PO TBDP
4.0000 mg | ORAL_TABLET | Freq: Three times a day (TID) | ORAL | 0 refills | Status: DC | PRN
  Filled 2023-07-02: qty 21, 7d supply, fill #0

## 2023-07-02 MED ORDER — HYDROXYZINE HCL 25 MG PO TABS
25.0000 mg | ORAL_TABLET | Freq: Three times a day (TID) | ORAL | 0 refills | Status: DC | PRN
  Filled 2023-07-02: qty 21, 7d supply, fill #0

## 2023-07-02 NOTE — TOC Transition Note (Signed)
Transition of Care [facility]) - CM/SW Discharge Note   Patient Details  Name: [name] MRN: [id number] Date of Birth: 1971/03/13  [facility] CM/SW Contact:  Tom-Johnson, Hershal Coria, RN Phone Number: 07/02/2023, 4:11 PM   Clinical Narrative:     Patient is scheduled for discharge today.  Readmission Risk Assessment done. Outpatient f/u, hospital f/u and discharge instructions on AVS. Prescriptions sent to [facility] pharmacy and meds will be delivered to patient at bedside prior discharge. No TOC needs or recommendations noted. Daughter, Sandria Manly to transport at discharge.  No further TOC needs noted.          Final next level of care: Home/Self Care Barriers to Discharge: Barriers Resolved   Patient Goals and CMS Choice CMS Medicare.gov Compare Post Acute Care list provided to:: Patient Choice offered to / list presented to : NA  Discharge Placement                  Patient to be transferred to facility by: Daughter Name of family member notified: [place]    Discharge Plan and Services Additional resources added to the After Visit Summary for                  DME Arranged: N/A DME Agency: NA       HH Arranged: NA HH Agency: NA        Social Determinants of Health (SDOH) Interventions SDOH Screenings   Food Insecurity: No Food Insecurity (06/28/2023)  Housing: Medium Risk (06/28/2023)  Transportation Needs: No Transportation Needs (06/28/2023)  Utilities: Not At Risk (06/28/2023)  Alcohol Screen: Low Risk  (02/18/2023)  Depression (PHQ2-9): High Risk (04/06/2023)  Financial Resource Strain: Medium Risk (06/06/2021)  Physical Activity: Inactive (02/18/2023)  Social Connections: Moderately Isolated (02/18/2023)  Stress: Stress Concern Present (02/18/2023)  Tobacco Use: Medium Risk (06/28/2023)     Readmission Risk Interventions    06/30/2023   10:04 AM 08/26/2021    2:49 PM  Readmission Risk Prevention Plan   Transportation Screening Complete Complete  PCP or Specialist Appt within 5-7 Days Complete   PCP or Specialist Appt within 3-5 Days  Complete  Home Care Screening Complete   Medication Review (RN CM) Referral to Pharmacy   HRI or Home Care Consult  Complete  Social Work Consult for Recovery Care Planning/Counseling  Complete  Palliative Care Screening  Not Applicable  Medication Review [employer])  Complete

## 2023-07-02 NOTE — Progress Notes (Signed)
DISCHARGE NOTE HOME [name] to be discharged Home per MD order. Discussed prescriptions and follow up appointments with the patient. Prescriptions given to patient; medication list explained in detail. Patient verbalized understanding.  Skin clean, dry and intact without evidence of skin break down, no evidence of skin tears noted. IV catheter discontinued intact. Site without signs and symptoms of complications. Dressing and pressure applied. Pt denies pain at the site currently. No complaints noted.  Patient free of lines, drains, and wounds.   An After Visit Summary (AVS) was printed and given to the patient. Patient escorted via wheelchair, and discharged home via private auto with daughter  [name], RN

## 2023-07-02 NOTE — Discharge Summary (Signed)
Name: [name] MRN: [id number] DOB: 10/31/1970 52 y.o. PCP: Hoy Register, MD  Date of Admission: 06/28/2023  9:21 AM Date of Discharge:  07/02/2023 Attending Physician: Dr. Antony Contras  DISCHARGE DIAGNOSIS:  Primary Problem: AKI (acute kidney injury) [facility])   Hospital Problems: Principal Problem:   AKI (acute kidney injury) (HCC)    DISCHARGE MEDICATIONS:   Allergies as of 07/02/2023       Reactions   Reglan [metoclopramide] Other (See Comments)   Panic attack        Medication List     STOP taking these medications    dapagliflozin propanediol 10 MG Tabs tablet Commonly known as: Farxiga   Mounjaro 12.5 MG/0.5ML Pen Generic drug: tirzepatide   ondansetron 4 MG tablet Commonly known as: ZOFRAN       TAKE these medications    acetaminophen 500 MG tablet Commonly known as: TYLENOL Take 1,000 mg by mouth 2 (two) times daily as needed for moderate pain, fever or headache.   albuterol (2.5 MG/3ML) 0.083% nebulizer solution Commonly known as: PROVENTIL Take 3 mLs (2.5 mg total) by nebulization every 6 (six) hours as needed for wheezing or shortness of breath. What changed: Another medication with the same name was changed. Make sure you understand how and when to take each.   albuterol 108 (90 Base) MCG/ACT inhaler Commonly known as: VENTOLIN HFA INHALE 2 PUFFS BY MOUTH EVERY 6 HOURS AS NEEDED FOR WHEEZING FOR SHORTNESS OF BREATH What changed: See the new instructions.   amitriptyline 10 MG tablet Commonly known as: ELAVIL Take 1 tablet (10 mg total) by mouth at bedtime.   amLODipine 5 MG tablet Commonly known as: NORVASC Take 1 tablet (5 mg total) by mouth daily.   buPROPion 150 MG 12 hr tablet Commonly known as: Wellbutrin SR Take 1 tablet (150 mg total) by mouth 2 (two) times daily.   cetirizine 10 MG tablet Commonly known as: ZYRTEC TAKE 1 TABLET BY MOUTH AT BEDTIME   Dulera 200-5 MCG/ACT Aero Generic drug:  mometasone-formoterol Inhale 2 puffs into the lungs in the morning and at bedtime.   FreeStyle Libre 2 Reader Hardie Pulley USE TO CHECK BLOOD SUGAR THREE TIMES DAILY.   FreeStyle Libre 2 Sensor Misc USE TO CHECK BLOOD GLUCOSE THREE TIMES DAILY.  CHANGE SENSOR ONCE EVERY 14 DAYS.   furosemide 40 MG tablet Commonly known as: LASIX Take 1 tablet (40 mg total) by mouth daily as needed for fluid or edema.   hydrOXYzine 25 MG tablet Commonly known as: ATARAX Take 1 tablet (25 mg total) by mouth 3 (three) times daily as needed for anxiety.   insulin lispro 100 UNIT/ML KwikPen Commonly known as: HumaLOG KwikPen Inject 10 Units into the skin 3 (three) times daily. What changed: when to take this   Insulin Pen Needle 31G X 8 MM Misc Use to inject [place] and Humalog. Pt may use 4 pen needles daily.   lisinopril 20 MG tablet Commonly known as: ZESTRIL Take 1 tablet (20 mg total) by mouth daily.   melatonin 3 MG Tabs tablet Take 1 tablet (3 mg total) by mouth at bedtime.   Misc. Facilities manager. Devices Misc Nebulizer device. Diagnosis - Asthma   montelukast 10 MG tablet Commonly known as: SINGULAIR Take 1 tablet (10 mg total) by mouth at bedtime.   ondansetron 4 MG disintegrating tablet Commonly known as: ZOFRAN-ODT Take 1 tablet (4 mg total) by mouth every 8 (eight) hours as needed for nausea  or vomiting. Please take zofran 30 mins before planned meal and allow to set in before attempting to eat.   OneTouch Delica Lancets 33G Misc Use to check glucose levels daily; E11.9   OneTouch Verio Flex System w/Device Kit Use to check glucose levels daily   OneTouch Verio test strip Generic drug: glucose blood Use to check glucose levels daily; E11.9   oxyCODONE-acetaminophen 10-325 MG tablet Commonly known as: PERCOCET Take 1 tablet by mouth 4 (four) times daily as needed for pain.   pantoprazole 40 MG tablet Commonly known as: PROTONIX Take 1 tablet (40 mg total)  by mouth 2 (two) times daily.   pregabalin 50 MG capsule Commonly known as: LYRICA TAKE 1 CAPSULE BY MOUTH THREE TIMES DAILY IN  THE  MORNING,  AT  NOON,  AND  AT  BEDTIME What changed: See the new instructions.   rosuvastatin 20 MG tablet Commonly known as: CRESTOR Take 1 tablet by mouth once daily   sertraline 100 MG tablet Commonly known as: ZOLOFT Take 2 tablets (200 mg total) by mouth at bedtime.   tiZANidine 4 MG tablet Commonly known as: ZANAFLEX Take 1 tablet (4 mg total) by mouth every 8 (eight) hours as needed for muscle spasms.   Evaristo Bury FlexTouch 200 UNIT/ML FlexTouch Pen Generic drug: insulin degludec Inject 20 Units into the skin daily. What changed: how much to take        DISPOSITION AND FOLLOW-UP:  [name] was discharged from [facility] in Stable condition. At the hospital follow up visit please address:  Follow-up Recommendations: Consults: GI and [facility] BMP Labs: BMP  Studies: None  Medications: Discontinued mounjaro due to concern for gastroparesis and farxiga per pharmacies recommendation to decrease likelihood of DKA event.   Follow-up Appointments:  Follow-up Information     Hoy Register, MD .   Specialty: Family Medicine Contact information: [address] [place] [address] [place] [address] [phone]         Hilarie Fredrickson, MD. Schedule an appointment as soon as possible for a visit in 1 week(s).   Specialty: Gastroenterology Why: Follow-up in outpatient clinic about nausea and vomiting. Contact information: [address] [place] [address] [phone]                - Follow appointment with [facility] Dr. Margo Aye at 1:30 PM on 10/16  - Make follow-up appointment with dentist to address oral health   [facility] [address], [place], [address] [phone] phone  New Patient Assessment/Therapy Walk-Ins:  Monday and Wednesday: 8 am  until slots are full. Every 1st and 2nd Fridays of the month: 1 pm - 5 pm.  NO ASSESSMENT/THERAPY WALK-INS ON TUESDAYS OR THURSDAYS  New Patient Assessment/Medication Management Walk-Ins:  Monday - Friday:  8 am - 11 am.  For all walk-ins, we ask that you arrive by 7:00 am because patients will be seen in the order of arrival.  Availability is limited; therefore, you may not be seen on the same day that you walk-in.   HOSPITAL COURSE:  Patient Summary: [name] is a 52 y.o. female with a past medical history of hypertension, hyperlipidemia, chronic diastolic heart failure, OSA, chronic respiratory failure, severe persistent asthma s/p trach dependent, chronic pain, depression, anxiety and type 1 diabetes who presents for neck pain and swelling who was admitted for AKI in the setting of limited PO intake.    Right sided neck pain  and swelling  Neck pain ongoing for several weeks prior to admission with limited PO intake and swelling. On assessment today, no surrounding erythema, drainage, sign of infection or Sialorrhea noted on exam. CT imaging without any signs of inflammation, abscess or fracture. 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  Pain regimen was adjusted to optimize pain with Tylenol 650 mg q6h prn, oxycodone-acetaminophen 10-325 mg q6h prn, IV Robaxin 1000 mg IV q8h. She discontinued her IV Robaxin and transitioned to her home Tinzanadine prior to discharge. Patient was encouraged to follow-up with [facility] for trach management outpatient.    Nausea & Vomiting  Hx Gastritis Upon presentation, the patient reported several weeks of nausea and vomiting at home.  Patient was started on IV zofran and symptoms were uncontrolled. Speech pathology was evaluated to evaluate swallowing and suspected GERD. GI was consulted for symptoms and suspected, symptoms may  have be multifactoral with chronic opioid use and recently starting manjuro 3 months prior. Amylase, lipase, gamma GT and hepatic panel were normal. They signed off and wanted further follow-up outpatient. The patients nausea was controlled on IV compazine. Patient was transitioned to oral dissolvable tablet of zofran, after transitioning off IV anti-emetics. Prior to discharge, the patients nausea was improved and she was educated on advancing diet as tolerable. She was educated to take zofran medication prior to attempting to eat or take oral medications and allow medicine to optimize symptom control. Prior to discharge she was tolerating fluids and was encouraged to advance diet gradually.   Acute Kidney Injury (resolved)  Patients Cr elevated at 2.28 and BUN of 22, suspect injury pre-renal given limited PO intake due to neck pain, nausea and emesis that past couple weeks. Patient was given fluids for resolution and her kidney function improved. Last Cr obtained was 1.20 at discharge at baseline.      Chronic Medical Conditions: Chronic pain: Home regimen of pregabalin 50 mg TID, percocet 10-325 QID prn, tizanadine 4 mg q8h. She was restarted on all her medications prior to discharge. Discussed with the patient the possibility that her chronic opioid can also play a role in her gastroparesis and persistent nausea and vomiting.     Asthma s/p complicated intubation and now trach dependent since 2018 Patient has a prior history of severe persistent asthma. She had a severe attack and complicated intubation in 2018, requiring tracheostomy. Her home regimen was Duelra 200-5 2 puff inhalation, BID and singulair 10 mg at bedtime. She continued on her home regimen throughout hospitalization.    Diabetes Mellitus  A1c was 8.3 on admission. Patients home regimen Tresiba 170 units, Humalog 10units TID w/ meals, Mounjaro 12.5 mg once weekly. Bgs 180s-250s. Her home regimen was held and she was started on  semglee 10 units and sensitive sliding scale insulin. To optimize blood glucose control, she transitioned to moderate sliding scale insulin amd increased Semglee 15 units for basal coverage. GI suspected her nausea was multifactorial with narcotics and diabetes control. They recommended discontinuing home mounjaro 12.5. Our pharmacy colleagues, recommended discontinuing Marcelline Deist to less the likelihood of DKA. She was discharged home with the following regimen: Tresiba 20 units daily and Humalog 10 units TID w/ meals. She monitors her glucose at home to avoid hyperglycemic and hypoglycemic. She was encouraged to discuss her medications further with her primary care physician for further adjustments outpatient.    Hypertension  Patient  has a prior history with a lisinopril 20 mg daily and amlodipine 5 mg at bedtime. She was restarted on her home regimen to control her blood pressure. She will continue to discuss her regiment with her primary care provider.   Depression, Anxiety  Patient has a prior history, with a home regimen bupropion 150 mg BID and sertraline 100 mg 2 tabs at night. She continued on that regimen throughout admission. She was provided with outpatient resources if she wanted to establish with a psychiatric provider for further med management and therapy services.     Migraines:  Per chart review, patient was taking for migraine prophylaxis, and restarted home medication to assist with headaches.    Necrotic Tooth and dental caries  Per EMR patient was evaluated August 2024 for a fractured tooth and gross tooth decay. At that time, they told her due to severe generalized dental anxiety and her diagnosis of tracheitis, she would not be a candidate for an IV sedation and extraction would need to be performed in the OR, with a referral placed. She was encouraged to follow-up with dentists at discharge to address oral health.    DISCHARGE INSTRUCTIONS:   Discharge Instructions     Call MD  for:   Complete by: As directed    Call MD for:  difficulty breathing, headache or visual disturbances   Complete by: As directed    Call MD for:  persistant nausea and vomiting   Complete by: As directed    Call MD for:  severe uncontrolled pain   Complete by: As directed    Diet Carb Modified   Complete by: As directed    Advance diet as tolerable, attempt soft diet with fluids, soups and apple sauce.   Discharge instructions   Complete by: As directed    It was a pleasure having you on the internal medicine service. You came to the emergency department due to neck pain and concern for neck swelling. Collected labs, showed that your kidney function was slightly injury and we suspect it was due to your inability to tolerate food and fluids with your chronic nausea and vomiting. Your kidney function improved with fluids. CT of your neck didn't show any signs of infection, inflammation or concerning features. Your neck pain continued to improve IV and medications by mouth. Please follow up with your surgeon at [facility] and follow-up about trach and neck pain. To better control your nausea symptoms, we consulted our GI colleagues and they suspect your nausea and vomiting could be due to the combination of chronic pain medications and your diabetes control. At discharge we discontinued your [facility] to not worsen your stomach motility. Our pharmacist, also recommended discontinuing the farxiga to not increase the chances of cause diabetic ketoacidosis. Please discuss restarting these with Dr. Alvis Lemmings if needed. Please decrease your triseba dose to 20 units daily and continue to take your Humalog at 10 units with meals. Please continue to discuss your pain regimen and how to optimize it in the setting of your diabetes management with your primary care provider. Please follow-up with [facility] outpatient for an appointment about your ongoing nausea. You were discharged with dissolvable zofran to better  control your nausea. Please take medicine 30 minutes before eating to allow medicine to set in.   Increase activity slowly   Complete by: As directed        SUBJECTIVE:   Patient notes improved neck pain this morning. Notes some arm pain at IV  site and believes it is irritating her arm. No vomiting overnight with IV compazine. Amenable to trial PO medications and restarting home oral medications. Nausea and emesis has been an ongoing chronic condition. Denies fevers, chills, or shortness of breath. Amenable with going home on oral anti nausea medication. Educated about gradually increasing diet as tolerable and using nausea medicine prior to eating meals. Will discuss chronic medical conditions further with primary care physician on 10/14  and follow-up with specialists for this hospitalization in the near future.    Discharge Vitals:   BP (!) 133/104 (BP Location: Right Arm)   Pulse (!) 114   Temp 98.6 F (37 C) (Oral)   Resp 18   LMP  (LMP Unknown)   SpO2 99%   OBJECTIVE:   Constitutional:      General: She is awake.     Appearance: Normal appearance. She is obese. She is not ill-appearing, toxic-appearing or diaphoretic.     Comments: Sitting up at bedside, this morning with husband on facetime    HENT:     Mouth/Throat:     Mouth: Mucous membranes are moist.     Dentition: Abnormal dentition. Dental caries present.      Comments: Fractured tooth #32, caries on #17 Eyes:     Conjunctiva/sclera: Conjunctivae normal.  Neck:     Trachea: Tracheostomy present.     Comments: ROM limited due to pain,  not hot to the touch, neck appears symmetrical, tracheostomy in place since 2018 Cardiovascular:     Rate and Rhythm: Normal rate.  Pulmonary:     Effort: Pulmonary effort is normal.     Breath sounds: Normal breath sounds. No wheezing.  Abdominal:     General: Bowel sounds are normal. There is no distension.     Palpations: Abdomen is soft.     Tenderness: There is abdominal  tenderness in the epigastric area. There is no guarding.     Comments: Chronic pain   Neurological:     General: No focal deficit present.     Mental Status: She is alert.  Psychiatric:        Behavior: Behavior is cooperative.     Pertinent Labs, Studies, and Procedures:     Latest Ref Rng & Units 07/01/2023    2:07 PM 06/29/2023    8:48 AM 06/28/2023   10:35 AM  CBC  WBC 4.0 - 10.5 K/uL 6.7  6.8  7.9   Hemoglobin 12.0 - 15.0 g/dL 78.2  95.6  21.3   Hematocrit 36.0 - 46.0 % 36.9  36.0  37.7   Platelets 150 - 400 K/uL 357  346  353        Latest Ref Rng & Units 07/01/2023    2:07 PM 07/01/2023    4:48 AM 06/30/2023    4:37 AM  CMP  Glucose 70 - 99 mg/dL  086  578   BUN 6 - 20 mg/dL  9  14   Creatinine 4.69 - 1.00 mg/dL  6.29  5.28   Sodium 413 - 145 mmol/L  138  135   Potassium 3.5 - 5.1 mmol/L  4.5  4.0   Chloride 98 - 111 mmol/L  104  101   CO2 22 - 32 mmol/L  24  22   Calcium 8.9 - 10.3 mg/dL  9.5  9.2   Total Protein 6.5 - 8.1 g/dL 7.4     Total Bilirubin 0.3 - 1.2 mg/dL 0.5     Alkaline Phos  38 - 126 U/L 106     AST 15 - 41 U/L 19     ALT 0 - 44 U/L 23       CT Soft Tissue Neck Wo Contrast  Result Date: 06/28/2023 CLINICAL DATA:  Epiglottitis or tonsillitis suspected.  Neck pain EXAM: CT NECK WITHOUT CONTRAST TECHNIQUE: Multidetector CT imaging of the neck was performed following the standard protocol without intravenous contrast. RADIATION DOSE REDUCTION: This exam was performed according to the departmental dose-optimization program which includes automated exposure control, adjustment of the mA and/or kV according to patient size and/or use of iterative reconstruction technique. COMPARISON:  06/16/2023 FINDINGS: Pharynx and larynx: Normal. No mass or swelling. Salivary glands: No inflammation, mass, or stone. Thyroid: Normal. Lymph nodes: None enlarged or abnormal density. Vascular: Negative. Limited intracranial: Negative Visualized orbits: Negative Mastoids and  visualized paranasal sinuses: Clear. Skeleton: Unremarkable Upper chest: Tracheostomy tube that is located. No regional inflammation is seen. Other: IMPRESSION: No explanation for symptoms. No visible inflammation in the neck. Electronically Signed   By: Tiburcio Pea M.D.   On: 06/28/2023 12:36   Signed: Peterson Ao, MD Psychiatry Resident, PGY-1 [facility]  Pager: [phone] 3:17 PM, 07/02/2023

## 2023-07-02 NOTE — Inpatient Diabetes Management (Signed)
Inpatient Diabetes Program Recommendations  AACE/ADA: New Consensus Statement on Inpatient Glycemic Control (2015)  Target Ranges:  Prepandial:   less than 140 mg/dL      Peak postprandial:   less than 180 mg/dL (1-2 hours)      Critically ill patients:  140 - 180 mg/dL   Lab Results  Component Value Date   GLUCAP 247 (H) 07/02/2023   HGBA1C 8.9 (H) 06/28/2023    Review of Glycemic Control  Diabetes history: DM2 Outpatient Diabetes medications: Farxiga 10 mg daily, Tresiba 170 units every day, Humalog 10 units TID, Mounjaro 12.5 mg weekly Current orders for Inpatient glycemic control: Semglee 15 daily, Novolog 0-20 units TID with meal  HgbA1C - 8.9%  Inpatient Diabetes Program Recommendations:    Consider adding Novolog 4 units TID with meal if eating > 50%.  If FBS > 180 in am, increase Semglee to 18-20 units daily  Continue to follow.  Thank you. Ailene Ards, RD, LDN, CDCES Inpatient Diabetes Coordinator [phone]

## 2023-07-02 NOTE — Discharge Instructions (Addendum)
Dear [name],   It was a pleasure having you on the internal medicine service. You came to the emergency department due to neck pain and concern for neck swelling. Collected labs, showed that your kidney function was slightly injury and we suspect it was due to your inability to tolerate food and fluids with your chronic nausea and vomiting. Your kidney function improved with fluids. CT of your neck didn't show any signs of infection, inflammation or concerning features. Your neck pain continued to improve IV and medications by mouth. Please follow up with your surgeon at [facility] and follow-up about trach and neck pain. To better control your nausea symptoms, we consulted our GI colleagues and they suspect your nausea and vomiting could be due to the combination of chronic pain medications and your diabetes control. At discharge we discontinued your [facility] to not worsen your stomach motility. Our pharmacist, also recommended discontinuing the farxiga to not increase the chances of cause diabetic ketoacidosis, a diabetic compliction. Please discuss restarting these with [name] if needed. Please decrease your triseba dose to 20 units daily and continue to take your Humalog at 10 units with meals. Please continue to discuss your pain regimen and how to optimize it in the setting of your diabetes management with your primary care provider. Please follow-up with [facility] outpatient for an appointment about your ongoing nausea. You were discharged with dissolvable zofran to better control your nausea. Please take medicine 30 minutes before eating to allow medicine to set in. I have included information for psychiatric med management and therapeutic services.   Best of Health going forward,   The internal medicine teaching service.   [facility] [address], [place], [address] [phone] phone  New Patient Assessment/Therapy Walk-Ins:  Monday and  Wednesday: 8 am until slots are full. Every 1st and 2nd Fridays of the month: 1 pm - 5 pm.  NO ASSESSMENT/THERAPY WALK-INS ON TUESDAYS OR THURSDAYS  New Patient Assessment/Medication Management Walk-Ins:  Monday - Friday:  8 am - 11 am.  For all walk-ins, we ask that you arrive by 7:00 am because patients will be seen in the order of arrival.  Availability is limited; therefore, you may not be seen on the same day that you walk-in.

## 2023-07-03 ENCOUNTER — Telehealth: Payer: Self-pay

## 2023-07-03 ENCOUNTER — Other Ambulatory Visit: Payer: Self-pay

## 2023-07-03 NOTE — Transitions of Care (Post Inpatient/ED Visit) (Signed)
07/03/2023  Name: [name] MRN: [id number] DOB: 03/10/1971  Today's TOC FU Call Status: Today's TOC FU Call Status:: Successful TOC FU Call Completed TOC FU Call Complete Date: 07/03/23 [name] and Date of Birth confirmed.  Transition Care Management Follow-up Telephone Call Date of Discharge: 07/02/23 Discharge Facility: [facility]) Type of Discharge: Inpatient Admission Primary Inpatient Discharge Diagnosis:: "AKI" How have you been since you were released from the hospital?: Same (pt states she is still having some N&V at times-last episode this morning-taking Zofran q59mins before meals as advised, was able to keep down dry toast and ginger ale-calling GI MD to advise & get recommendations & other med options) Any questions or concerns?: No  Items Reviewed: Did you receive and understand the discharge instructions provided?: Yes Medications obtained,verified, and reconciled?: Partial Review Completed Reason for Partial Mediation Review: pt resting-did not feel well and meds not near her Any new allergies since your discharge?: No Type of Diet Ordered:: low salt/heart healthy/carb modified Do you have support at home?: Yes People in Home: spouse, child(ren), adult Name of Support/Comfort Primary Source: [name]  Medications Reviewed Today: Medications Reviewed Today     Reviewed by Charlyn Minerva, RN (Registered Nurse) on 07/03/23 at 1336  Med List Status: <None>   Medication Order Taking? Sig Documenting Provider Last Dose Status Informant  acetaminophen (TYLENOL) 500 MG tablet [id number]  Take 1,000 mg by mouth 2 (two) times daily as needed for moderate pain, fever or headache. [provider]  Active Self  albuterol (PROVENTIL) (2.5 MG/3ML) 0.083% nebulizer solution [id number]  Take 3 mLs (2.5 mg total) by nebulization every 6 (six) hours as needed for wheezing or shortness of breath. Hoy Register, MD  Active Self   albuterol (VENTOLIN HFA) 108 (90 Base) MCG/ACT inhaler [id number]  INHALE 2 PUFFS BY MOUTH EVERY 6 HOURS AS NEEDED FOR WHEEZING FOR SHORTNESS OF BREATH  Patient taking differently: Inhale 2 puffs into the lungs every 6 (six) hours as needed for shortness of breath or wheezing.   Hoy Register, MD  Active Self  amitriptyline (ELAVIL) 10 MG tablet [id number]  Take 1 tablet (10 mg total) by mouth at bedtime. Storm Frisk, MD  Active Self  amLODipine (NORVASC) 5 MG tablet [id number]  Take 1 tablet (5 mg total) by mouth daily. Meriam Sprague, MD  Expired 06/28/23 2359 Self           Med Note (COFFELL, Marzella Schlein   Wed Mar 11, 2023 11:01 AM) Pt verified she is taking the reduced new dose (5mg ).  Blood Glucose Monitoring Suppl (ONETOUCH VERIO FLEX SYSTEM) w/Device KIT [id number]  Use to check glucose levels daily Hoy Register, MD  Active Self  buPROPion (WELLBUTRIN SR) 150 MG 12 hr tablet [id number]  Take 1 tablet (150 mg total) by mouth 2 (two) times daily. Hoy Register, MD  Active Self  cetirizine (ZYRTEC) 10 MG tablet [id number]  TAKE 1 TABLET BY MOUTH AT BEDTIME Charlott Holler, MD  Active Self  Continuous Glucose Receiver (FREESTYLE LIBRE 2 READER) DEVI [id number]  USE TO CHECK BLOOD SUGAR THREE TIMES DAILY. Hoy Register, MD  Active Self  Continuous Glucose Sensor (FREESTYLE LIBRE 2 SENSOR) MISC [id number]  USE TO CHECK BLOOD GLUCOSE THREE TIMES DAILY.  CHANGE SENSOR ONCE EVERY 14 DAYS. Hoy Register, MD  Active Self    Discontinued 10/15/17 1550 [facility] Shortage)   furosemide (LASIX) 40 MG tablet [id number]  Take 1 tablet (40 mg total) by mouth  daily as needed for fluid or edema. Almon Hercules, MD  Active Self  glucose blood (ONETOUCH VERIO) test strip [id number]  Use to check glucose levels daily; E11.9 Hoy Register, MD  Active Self  hydrOXYzine (ATARAX) 25 MG tablet [id number] Yes Take 1 tablet (25 mg total) by mouth 3 (three) times daily as needed for anxiety. Peterson Ao,  MD Taking Active   insulin degludec (TRESIBA FLEXTOUCH) 200 UNIT/ML FlexTouch Pen [id number]  Inject 20 Units into the skin daily. Peterson Ao, MD  Active   insulin lispro (HUMALOG KWIKPEN) 100 UNIT/ML KwikPen [id number]  Inject 10 Units into the skin 3 (three) times daily.  Patient taking differently: Inject 10 Units into the skin with breakfast, with lunch, and with evening meal.   Hoy Register, MD  Active Self  Insulin Pen Needle 31G X 8 MM MISC [id number]  Use to inject Tresiba and Humalog. Pt may use 4 pen needles daily. Hoy Register, MD  Active Self  lisinopril (ZESTRIL) 20 MG tablet [id number]  Take 1 tablet (20 mg total) by mouth daily. Almon Hercules, MD  Active Self  melatonin 3 MG TABS tablet [id number]  Take 1 tablet (3 mg total) by mouth at bedtime. Hoy Register, MD  Active Self  Misc. Devices MISC [id number]  Shower chair Hoy Register, MD  Active Self  Misc. Devices MISC [id number]  Nebulizer device. Diagnosis - Asthma Hoy Register, MD  Active Self  mometasone-formoterol (DULERA) 200-5 MCG/ACT AERO [id number]  Inhale 2 puffs into the lungs in the morning and at bedtime. Hoy Register, MD  Active Self  montelukast (SINGULAIR) 10 MG tablet [id number]  Take 1 tablet (10 mg total) by mouth at bedtime. Hoy Register, MD  Active Self  ondansetron (ZOFRAN-ODT) 4 MG disintegrating tablet [id number] Yes Take 1 tablet (4 mg total) by mouth every 8 (eight) hours as needed for nausea or vomiting. Please take zofran 30 mins before planned meal and allow to set in before attempting to eat. Peterson Ao, MD Taking Active   [facility] Lancets 33[address] [place] [id number]  Use to check glucose levels daily; E11.9 Hoy Register, MD  Active Self  oxyCODONE-acetaminophen (PERCOCET) 10-325 MG tablet [id number]  Take 1 tablet by mouth 4 (four) times daily as needed for pain. [provider]  Active Self           Med Note (COFFELL, Marzella Schlein   Wed Mar 11, 2023 11:04 AM)    pantoprazole  (PROTONIX) 40 MG tablet [id number] Yes Take 1 tablet (40 mg total) by mouth 2 (two) times daily. Hoy Register, MD Taking Active Self  pregabalin (LYRICA) 50 MG capsule [id number]  TAKE 1 CAPSULE BY MOUTH THREE TIMES DAILY IN  THE  MORNING,  AT  NOON,  AND  AT  BEDTIME  Patient taking differently: Take 50 mg by mouth 3 (three) times daily.   London Sheer, MD  Active Self  rosuvastatin (CRESTOR) 20 MG tablet [id number]  Take 1 tablet by mouth once daily Hoy Register, MD  Active Self  sertraline (ZOLOFT) 100 MG tablet [id number]  Take 2 tablets (200 mg total) by mouth at bedtime. Hoy Register, MD  Active Self  tiZANidine (ZANAFLEX) 4 MG tablet [id number]  Take 1 tablet (4 mg total) by mouth every 8 (eight) hours as needed for muscle spasms. Hoy Register, MD  Active Self            Home Care and Equipment/Supplies: Were Home Health Services Ordered?: No Any new equipment or  medical supplies ordered?: No  Functional Questionnaire: Do you need assistance with bathing/showering or dressing?: No Do you need assistance with meal preparation?: No Do you need assistance with eating?: No Do you have difficulty maintaining continence: No Do you need assistance with getting out of bed/getting out of a chair/moving?: No Do you have difficulty managing or taking your medications?: No  Follow up appointments reviewed: PCP Follow-up appointment confirmed?: Yes Date of PCP follow-up appointment?: 07/06/23 Follow-up Provider: Dr. Alvis Lemmings Specialist [facility] Follow-up appointment confirmed?: Yes Date of Specialist follow-up appointment?: 07/08/23 Follow-Up Specialty Provider:: [facility] Reason Specialist Follow-Up Not Confirmed: Patient has Specialist Provider Number and will Call for Appointment (pt states GI office will call her with f/u appt info) Do you need transportation to your follow-up appointment?: No Do you understand care options if your condition(s) worsen?: Yes-patient  verbalized understanding  SDOH Interventions Today    Flowsheet Row Bradley Recent Value  SDOH Interventions   Transportation Interventions Intervention Not Indicated      TOC Interventions Today    Flowsheet Row Bradley Recent Value  TOC Interventions   TOC Interventions Discussed/Reviewed TOC Interventions Discussed      Interventions Today    Flowsheet Row Bradley Recent Value  General Interventions   General Interventions Discussed/Reviewed General Interventions Discussed, Doctor Visits, Durable Medical Equipment (DME), Referral to Nurse  [pt has professional home care services that comes to home to assist her as needed, pt knowledgeable regardinng condition-manages trach herself-denies need for continued calls]  Doctor Visits Discussed/Reviewed Doctor Visits Discussed, PCP, Specialist  Durable Medical Equipment (DME) Glucomoter  [pt has cbg meter-unsure of what blood sugar is today]  PCP/Specialist Visits Compliance with follow-up visit  Education Interventions   Education Provided Provided Education  Provided Verbal Education On Nutrition, Medication, When to see the doctor, Other  [sx mgmt]  Nutrition Interventions   Nutrition Discussed/Reviewed Nutrition Discussed, Fluid intake  Safety Interventions   Safety Discussed/Reviewed Safety Discussed        Antionette Fairy, RN,BSN,CCM RN Care Manager Transitions of Care  [place]  Direct Phone: [phone] Toll Free: [phone] Fax: [phone]

## 2023-07-06 ENCOUNTER — Telehealth (HOSPITAL_BASED_OUTPATIENT_CLINIC_OR_DEPARTMENT_OTHER): Payer: 59 | Admitting: Family Medicine

## 2023-07-06 ENCOUNTER — Encounter: Payer: Self-pay | Admitting: Family Medicine

## 2023-07-06 DIAGNOSIS — I5033 Acute on chronic diastolic (congestive) heart failure: Secondary | ICD-10-CM

## 2023-07-06 DIAGNOSIS — F330 Major depressive disorder, recurrent, mild: Secondary | ICD-10-CM

## 2023-07-06 DIAGNOSIS — K3184 Gastroparesis: Secondary | ICD-10-CM

## 2023-07-06 DIAGNOSIS — I110 Hypertensive heart disease with heart failure: Secondary | ICD-10-CM

## 2023-07-06 DIAGNOSIS — R1310 Dysphagia, unspecified: Secondary | ICD-10-CM

## 2023-07-06 DIAGNOSIS — Z79899 Other long term (current) drug therapy: Secondary | ICD-10-CM

## 2023-07-06 DIAGNOSIS — G8929 Other chronic pain: Secondary | ICD-10-CM

## 2023-07-06 DIAGNOSIS — I10 Essential (primary) hypertension: Secondary | ICD-10-CM | POA: Diagnosis not present

## 2023-07-06 DIAGNOSIS — R070 Pain in throat: Secondary | ICD-10-CM

## 2023-07-06 DIAGNOSIS — G894 Chronic pain syndrome: Secondary | ICD-10-CM

## 2023-07-06 DIAGNOSIS — E1159 Type 2 diabetes mellitus with other circulatory complications: Secondary | ICD-10-CM

## 2023-07-06 DIAGNOSIS — I5032 Chronic diastolic (congestive) heart failure: Secondary | ICD-10-CM

## 2023-07-06 DIAGNOSIS — E109 Type 1 diabetes mellitus without complications: Secondary | ICD-10-CM

## 2023-07-06 DIAGNOSIS — I119 Hypertensive heart disease without heart failure: Secondary | ICD-10-CM

## 2023-07-06 DIAGNOSIS — E1143 Type 2 diabetes mellitus with diabetic autonomic (poly)neuropathy: Secondary | ICD-10-CM | POA: Insufficient documentation

## 2023-07-06 DIAGNOSIS — K219 Gastro-esophageal reflux disease without esophagitis: Secondary | ICD-10-CM

## 2023-07-06 MED ORDER — LISINOPRIL 20 MG PO TABS
20.0000 mg | ORAL_TABLET | Freq: Every day | ORAL | 1 refills | Status: DC
Start: 2023-07-06 — End: 2024-03-29

## 2023-07-06 MED ORDER — GABAPENTIN 600 MG PO TABS
600.0000 mg | ORAL_TABLET | Freq: Three times a day (TID) | ORAL | 3 refills | Status: DC
Start: 2023-07-06 — End: 2023-12-11

## 2023-07-06 MED ORDER — BUPROPION HCL ER (SR) 150 MG PO TB12
150.0000 mg | ORAL_TABLET | Freq: Two times a day (BID) | ORAL | 1 refills | Status: DC
Start: 2023-07-06 — End: 2024-03-01

## 2023-07-06 MED ORDER — AMLODIPINE BESYLATE 5 MG PO TABS
5.0000 mg | ORAL_TABLET | Freq: Every day | ORAL | 1 refills | Status: DC
Start: 2023-07-06 — End: 2023-07-19

## 2023-07-06 MED ORDER — PANTOPRAZOLE SODIUM 40 MG PO TBEC
40.0000 mg | DELAYED_RELEASE_TABLET | Freq: Two times a day (BID) | ORAL | 1 refills | Status: DC
Start: 2023-07-06 — End: 2023-11-12

## 2023-07-06 MED ORDER — PROMETHAZINE HCL 25 MG RE SUPP
25.0000 mg | Freq: Three times a day (TID) | RECTAL | 0 refills | Status: DC | PRN
Start: 2023-07-06 — End: 2024-03-29

## 2023-07-06 MED ORDER — TIZANIDINE HCL 4 MG PO TABS
4.0000 mg | ORAL_TABLET | Freq: Three times a day (TID) | ORAL | 3 refills | Status: DC | PRN
Start: 2023-07-06 — End: 2024-02-17

## 2023-07-06 NOTE — Progress Notes (Unsigned)
Virtual Visit via Video Note  I connected with [name], on 07/06/2023 at 4:00 PM by video enabled telemedicine device and verified that I am speaking with the correct person using two identifiers.   Consent: I discussed the limitations, risks, security and privacy concerns of performing an evaluation and management service by telemedicine and the availability of in person appointments. I also discussed with the patient that there may be a patient responsible charge related to this service. The patient expressed understanding and agreed to proceed.   Location of Patient: Home  Location of Provider: Clinic   Persons participating in Telemedicine visit: [name] Dr. Alvis Lemmings     History of Present Illness: [name] is a 52 y.o. year old female  with a history of type 1 diabetes mellitus (A1c 9.5), hypertension, obstructive sleep apnea, asthma, depression, s/p tracheostomy secondary to intubation related tracheal injury, subglottic stenosis (status post balloon dilatation and Kenalog injection of stenosis tracheostomy tube exchange by ENT, status post right rotator cuff surgery in 07/2019. Uses 2L oxygen at night, s/p R L4L5 transforaminal lumbar interbody fusion with rods, screws and cage, local bone graft, allogen bone graft, vivigen in 09/2021.  Discussed the use of AI scribe software for clinical note transcription with the patient, who gave verbal consent to proceed.  Hospitalized last week for AKI secondary to poor oral intake in the setting of nausea and vomiting.  She had presented with a creatinine of 2.3 above her baseline of 1.2.  Her GLP-1 RA, Marcelline Deist were discontinued.  She presents with persistent nausea and vomiting. Despite following discharge instructions from a recent hospital admission, she has been unable to keep anything down, including saltine crackers and broth. This has led to dehydration and kidney issues  in the past. She reports difficulty swallowing certain pills and capsules due to throat and neck swelling, requiring her to crush her medications for consumption.  The patient's gastroparesis has been a long-standing issue, with periods of exacerbation and remission. Current antiemetic therapy with Zofran has not been effective in managing her symptoms. She also reports difficulty managing her blood sugars since discontinuation of Mounjaro and Farxiga, with recent readings as high as 340. She is currently managing her diabetes with [place] and Novolog, though she refrains from taking Novolog when she is unable to eat.      Past Medical History:  Diagnosis Date   Abnormal UGI series    Acute pain of right shoulder due to trauma 10/05/2018   Acute respiratory failure with hypoxia and hypercapnia (HCC) 10/23/2018   Allergy    Anemia    Anxiety    Arthritis    Asthma    Chest pain of uncertain etiology 03/22/2019   Chronic back pain    Chronic chest pain    resolved, no problems since 2019 per patient 07/27/19   Coffee ground vomiting 10/14/2021   COPD (chronic obstructive pulmonary disease) (HCC)    inhaler   Depression    DM (diabetes mellitus) (HCC)    INSULIN DEPENDENT - TYPE 1   Dyspnea    GERD (gastroesophageal reflux disease)    Headache(784.0)    History of hiatal hernia    Hyperlipidemia    no med, diet controlled   Hypertension    Hypokalemia    Klebsiella cystitis 03/14/2017   Last Assessment & Plan:   Formatting of this note might be different from the original.  - On Zosyn  - Urine culture: Klebsiella pneumoniae  Neuromuscular disorder (HCC)    Osteoarthritis    Oxygen deficiency    Pneumonia    Respiratory disease 05/2017   uses inhalers, neb tx prn, no oxygen   Rotator cuff tear 07/28/2019   Sleep apnea    does not use CPAP due to trach, uses 2L supplement oxygen at night   Status post tracheostomy (HCC) 03/14/2017   Last Assessment & Plan:  Formatting of this  note might be different from the original. - VERY CRITICAL AIRWAY WITH EXTREME PRECUATION - On tach - ENT following along (please see above for current trach management plan)   Tracheostomy in place [facility]) 02/2017   Allergies  Allergen Reactions   Reglan [Metoclopramide] Other (See Comments)    Panic attack    Current Outpatient Medications on File Prior to Visit  Medication Sig Dispense Refill   acetaminophen (TYLENOL) 500 MG tablet Take 1,000 mg by mouth 2 (two) times daily as needed for moderate pain, fever or headache.     albuterol (PROVENTIL) (2.5 MG/3ML) 0.083% nebulizer solution Take 3 mLs (2.5 mg total) by nebulization every 6 (six) hours as needed for wheezing or shortness of breath. 75 mL 12   albuterol (VENTOLIN HFA) 108 (90 Base) MCG/ACT inhaler INHALE 2 PUFFS BY MOUTH EVERY 6 HOURS AS NEEDED FOR WHEEZING FOR SHORTNESS OF BREATH (Patient taking differently: Inhale 2 puffs into the lungs every 6 (six) hours as needed for shortness of breath or wheezing.) 18 g 0   amitriptyline (ELAVIL) 10 MG tablet Take 1 tablet (10 mg total) by mouth at bedtime. 30 tablet 0   amLODipine (NORVASC) 5 MG tablet Take 1 tablet (5 mg total) by mouth daily. 180 tablet 3   Blood Glucose Monitoring Suppl (ONETOUCH VERIO FLEX SYSTEM) w/Device KIT Use to check glucose levels daily 1 kit 0   buPROPion (WELLBUTRIN SR) 150 MG 12 hr tablet Take 1 tablet (150 mg total) by mouth 2 (two) times daily. 180 tablet 1   cetirizine (ZYRTEC) 10 MG tablet TAKE 1 TABLET BY MOUTH AT BEDTIME 30 tablet 0   Continuous Glucose Receiver (FREESTYLE LIBRE 2 READER) DEVI USE TO CHECK BLOOD SUGAR THREE TIMES DAILY. 1 each 0   Continuous Glucose Sensor (FREESTYLE LIBRE 2 SENSOR) MISC USE TO CHECK BLOOD GLUCOSE THREE TIMES DAILY.  CHANGE SENSOR ONCE EVERY 14 DAYS. 6 each 0   furosemide (LASIX) 40 MG tablet Take 1 tablet (40 mg total) by mouth daily as needed for fluid or edema. 30 tablet 1   glucose blood (ONETOUCH VERIO) test strip Use  to check glucose levels daily; E11.9 100 each 6   hydrOXYzine (ATARAX) 25 MG tablet Take 1 tablet (25 mg total) by mouth 3 (three) times daily as needed for anxiety. 21 tablet 0   insulin degludec (TRESIBA FLEXTOUCH) 200 UNIT/ML FlexTouch Pen Inject 20 Units into the skin daily.     insulin lispro (HUMALOG KWIKPEN) 100 UNIT/ML KwikPen Inject 10 Units into the skin 3 (three) times daily. (Patient taking differently: Inject 10 Units into the skin with breakfast, with lunch, and with evening meal.) 15 mL 6   Insulin Pen Needle 31G X 8 MM MISC Use to inject [place] and Humalog. Pt may use 4 pen needles daily. 100 each 11   lisinopril (ZESTRIL) 20 MG tablet Take 1 tablet (20 mg total) by mouth daily. 90 tablet 1   melatonin 3 MG TABS tablet Take 1 tablet (3 mg total) by mouth at bedtime. 30 tablet 3   Misc.  Devices MISC Shower chair 1 each 0   Misc. Devices MISC Nebulizer device. Diagnosis - Asthma 1 each 0   mometasone-formoterol (DULERA) 200-5 MCG/ACT AERO Inhale 2 puffs into the lungs in the morning and at bedtime. 1 each 6   montelukast (SINGULAIR) 10 MG tablet Take 1 tablet (10 mg total) by mouth at bedtime. 90 tablet 1   ondansetron (ZOFRAN-ODT) 4 MG disintegrating tablet Take 1 tablet (4 mg total) by mouth every 8 (eight) hours as needed for nausea or vomiting. Please take zofran 30 mins before planned meal and allow to set in before attempting to eat. 21 tablet 0   OneTouch Delica Lancets 33G MISC Use to check glucose levels daily; E11.9 100 each 3   oxyCODONE-acetaminophen (PERCOCET) 10-325 MG tablet Take 1 tablet by mouth 4 (four) times daily as needed for pain.     pantoprazole (PROTONIX) 40 MG tablet Take 1 tablet (40 mg total) by mouth 2 (two) times daily. 180 tablet 1   pregabalin (LYRICA) 50 MG capsule TAKE 1 CAPSULE BY MOUTH THREE TIMES DAILY IN  THE  MORNING,  AT  NOON,  AND  AT  BEDTIME (Patient taking differently: Take 50 mg by mouth 3 (three) times daily.) 90 capsule 0   rosuvastatin  (CRESTOR) 20 MG tablet Take 1 tablet by mouth once daily 90 tablet 0   sertraline (ZOLOFT) 100 MG tablet Take 2 tablets (200 mg total) by mouth at bedtime. 90 tablet 1   tiZANidine (ZANAFLEX) 4 MG tablet Take 1 tablet (4 mg total) by mouth every 8 (eight) hours as needed for muscle spasms. 90 tablet 3   [DISCONTINUED] fluticasone-salmeterol (ADVAIR HFA) 230-21 MCG/ACT inhaler Inhale 2 puffs into the lungs 2 (two) times daily. 1 Inhaler 3   No current facility-administered medications on file prior to visit.    ROS: See HPI  Observations/Objective: Awake, alert, oriented x3 Not in acute distress Tracheostomy tube in place Normal mood      Latest Ref Rng & Units 07/01/2023    2:07 PM 07/01/2023    4:48 AM 06/30/2023    4:37 AM  CMP  Glucose 70 - 99 mg/dL  161  096   BUN 6 - 20 mg/dL  9  14   Creatinine 0.45 - 1.00 mg/dL  4.09  8.11   Sodium 914 - 145 mmol/L  138  135   Potassium 3.5 - 5.1 mmol/L  4.5  4.0   Chloride 98 - 111 mmol/L  104  101   CO2 22 - 32 mmol/L  24  22   Calcium 8.9 - 10.3 mg/dL  9.5  9.2   Total Protein 6.5 - 8.1 g/dL 7.4     Total Bilirubin 0.3 - 1.2 mg/dL 0.5     Alkaline Phos 38 - 126 U/L 106     AST 15 - 41 U/L 19     ALT 0 - 44 U/L 23       Lipid Panel     Component Value Date/Time   CHOL 144 10/09/2022 1046   TRIG 124 10/09/2022 1046   HDL 54 10/09/2022 1046   CHOLHDL 3.2 06/15/2018 1031   CHOLHDL 3.9 02/22/2016 0954   VLDL 26 02/22/2016 0954   LDLCALC 68 10/09/2022 1046   LABVLDL 22 10/09/2022 1046    Lab Results  Component Value Date   HGBA1C 8.9 (H) 06/28/2023     Assessment and Plan:     Gastroparesis Persistent nausea and vomiting despite Zofran use. History  of hospitalization for dehydration and acute kidney injury.  -GLP-1 RA discontinued but symptoms are yet to improve.  She states gastroparesis symptoms preceded her use of GLP-1 RA -Referred to GI but she is yet to receive an appointment -Continue Zofran as  prescribed. -Prescribe Promethazine suppositories for additional nausea control. -Advised to stay hydrated  Dysphagia Difficulty swallowing pills  -Trach in place. -ENT evaluation in 2 days. -Consider esophageal dilation if deemed necessary by ENT.  Type 1 Diabetes Blood sugars ranging from 168 to 340. Mounjaro and [place] discontinued due to gastroparesis. -Continue Evaristo Bury; holding off on increasing dose due to the fact that she has a poor oral intake -Hold Novolog insulin until appetite improves and regular eating resumes. -Check blood sugars regularly.  Neuropathic Pain Difficulty swallowing Gabapentin capsules. -Prescribe Gabapentin 300mg  tablets, 3 times a day as needed.  Hypertension Continue antihypertensives Counseled on blood pressure goal of less than 130/80, low-sodium, DASH diet, medication compliance, 150 minutes of moderate intensity exercise per week. Discussed medication compliance, adverse effects.   Hypertensive heart disease EF of 50% from 01/2023 Asymptomatic Continue furosemide   Chronic pain She complains of inability to swallow capsules and would like to switch from Lyrica to gabapentin tablets Continue tizanidine Currently managed by the pain clinic   General Health Maintenance -Stay hydrated to prevent kidney injury. -Refill necessary medications.        Follow Up Instructions: 3 months    I discussed the assessment and treatment plan with the patient. The patient was provided an opportunity to ask questions and all were answered. The patient agreed with the plan and demonstrated an understanding of the instructions.   The patient was advised to call back or seek an in-person evaluation if the symptoms worsen or if the condition fails to improve as anticipated.     I provided 24 minutes total of Telehealth time during this encounter including median intraservice time, reviewing previous notes, investigations, ordering medications,  medical decision making, coordinating care and patient verbalized understanding at the end of the visit.   Visit required 49 minutes of patient care including median intraservice time, reviewing previous notes and test results, coordination of care, counseling the patient in addition to management of chronic medical conditions.Time also spent ordering medications, investigations and documenting in the chart.  All questions were answered to the patient's satisfaction   Hoy Register, MD, FAAFP. [facility] [place], [place] [phone]   07/06/2023, 4:00 PM

## 2023-07-07 DIAGNOSIS — G4733 Obstructive sleep apnea (adult) (pediatric): Secondary | ICD-10-CM | POA: Diagnosis not present

## 2023-07-07 DIAGNOSIS — J45909 Unspecified asthma, uncomplicated: Secondary | ICD-10-CM | POA: Diagnosis not present

## 2023-07-07 DIAGNOSIS — J449 Chronic obstructive pulmonary disease, unspecified: Secondary | ICD-10-CM | POA: Diagnosis not present

## 2023-07-07 DIAGNOSIS — J9610 Chronic respiratory failure, unspecified whether with hypoxia or hypercapnia: Secondary | ICD-10-CM | POA: Diagnosis not present

## 2023-07-08 DIAGNOSIS — Z930 Tracheostomy status: Secondary | ICD-10-CM | POA: Diagnosis not present

## 2023-07-08 DIAGNOSIS — J3802 Paralysis of vocal cords and larynx, bilateral: Secondary | ICD-10-CM | POA: Diagnosis not present

## 2023-07-08 DIAGNOSIS — J386 Stenosis of larynx: Secondary | ICD-10-CM | POA: Diagnosis not present

## 2023-07-10 DIAGNOSIS — G4733 Obstructive sleep apnea (adult) (pediatric): Secondary | ICD-10-CM | POA: Diagnosis not present

## 2023-07-10 DIAGNOSIS — J449 Chronic obstructive pulmonary disease, unspecified: Secondary | ICD-10-CM | POA: Diagnosis not present

## 2023-07-10 DIAGNOSIS — Z930 Tracheostomy status: Secondary | ICD-10-CM | POA: Diagnosis not present

## 2023-07-10 DIAGNOSIS — J45909 Unspecified asthma, uncomplicated: Secondary | ICD-10-CM | POA: Diagnosis not present

## 2023-07-11 DIAGNOSIS — J4540 Moderate persistent asthma, uncomplicated: Secondary | ICD-10-CM | POA: Diagnosis not present

## 2023-07-17 ENCOUNTER — Emergency Department (HOSPITAL_COMMUNITY): Payer: 59

## 2023-07-17 ENCOUNTER — Encounter: Payer: Self-pay | Admitting: Family Medicine

## 2023-07-17 ENCOUNTER — Other Ambulatory Visit: Payer: Self-pay

## 2023-07-17 ENCOUNTER — Encounter (HOSPITAL_COMMUNITY): Payer: Self-pay | Admitting: Emergency Medicine

## 2023-07-17 ENCOUNTER — Emergency Department (HOSPITAL_COMMUNITY)
Admission: EM | Admit: 2023-07-17 | Discharge: 2023-07-17 | Disposition: A | Discharge status: Home / Self Care | Payer: 59 | Source: Home / Self Care | Attending: Emergency Medicine | Admitting: Emergency Medicine

## 2023-07-17 DIAGNOSIS — E1143 Type 2 diabetes mellitus with diabetic autonomic (poly)neuropathy: Secondary | ICD-10-CM | POA: Diagnosis not present

## 2023-07-17 DIAGNOSIS — I5032 Chronic diastolic (congestive) heart failure: Secondary | ICD-10-CM | POA: Diagnosis not present

## 2023-07-17 DIAGNOSIS — R0989 Other specified symptoms and signs involving the circulatory and respiratory systems: Secondary | ICD-10-CM | POA: Diagnosis not present

## 2023-07-17 DIAGNOSIS — J4551 Severe persistent asthma with (acute) exacerbation: Secondary | ICD-10-CM | POA: Diagnosis not present

## 2023-07-17 DIAGNOSIS — R9389 Abnormal findings on diagnostic imaging of other specified body structures: Secondary | ICD-10-CM | POA: Diagnosis not present

## 2023-07-17 DIAGNOSIS — Z0389 Encounter for observation for other suspected diseases and conditions ruled out: Secondary | ICD-10-CM | POA: Diagnosis not present

## 2023-07-17 DIAGNOSIS — E119 Type 2 diabetes mellitus without complications: Secondary | ICD-10-CM | POA: Insufficient documentation

## 2023-07-17 DIAGNOSIS — Z930 Tracheostomy status: Secondary | ICD-10-CM | POA: Diagnosis not present

## 2023-07-17 DIAGNOSIS — K219 Gastro-esophageal reflux disease without esophagitis: Secondary | ICD-10-CM | POA: Diagnosis not present

## 2023-07-17 DIAGNOSIS — I509 Heart failure, unspecified: Secondary | ICD-10-CM | POA: Insufficient documentation

## 2023-07-17 DIAGNOSIS — R000 Tachycardia, unspecified: Secondary | ICD-10-CM | POA: Diagnosis not present

## 2023-07-17 DIAGNOSIS — Z794 Long term (current) use of insulin: Secondary | ICD-10-CM | POA: Diagnosis not present

## 2023-07-17 DIAGNOSIS — Z1152 Encounter for screening for COVID-19: Secondary | ICD-10-CM | POA: Diagnosis not present

## 2023-07-17 DIAGNOSIS — R0789 Other chest pain: Secondary | ICD-10-CM | POA: Insufficient documentation

## 2023-07-17 DIAGNOSIS — S199XXA Unspecified injury of neck, initial encounter: Secondary | ICD-10-CM | POA: Diagnosis not present

## 2023-07-17 DIAGNOSIS — K3184 Gastroparesis: Secondary | ICD-10-CM | POA: Diagnosis not present

## 2023-07-17 DIAGNOSIS — G894 Chronic pain syndrome: Secondary | ICD-10-CM | POA: Diagnosis not present

## 2023-07-17 DIAGNOSIS — J398 Other specified diseases of upper respiratory tract: Secondary | ICD-10-CM | POA: Diagnosis not present

## 2023-07-17 DIAGNOSIS — I517 Cardiomegaly: Secondary | ICD-10-CM | POA: Diagnosis not present

## 2023-07-17 DIAGNOSIS — J9621 Acute and chronic respiratory failure with hypoxia: Secondary | ICD-10-CM | POA: Diagnosis not present

## 2023-07-17 DIAGNOSIS — R0602 Shortness of breath: Secondary | ICD-10-CM | POA: Diagnosis not present

## 2023-07-17 DIAGNOSIS — N390 Urinary tract infection, site not specified: Secondary | ICD-10-CM | POA: Diagnosis not present

## 2023-07-17 DIAGNOSIS — E873 Alkalosis: Secondary | ICD-10-CM | POA: Diagnosis not present

## 2023-07-17 DIAGNOSIS — S0990XA Unspecified injury of head, initial encounter: Secondary | ICD-10-CM | POA: Diagnosis not present

## 2023-07-17 DIAGNOSIS — I110 Hypertensive heart disease with heart failure: Secondary | ICD-10-CM | POA: Insufficient documentation

## 2023-07-17 DIAGNOSIS — D72829 Elevated white blood cell count, unspecified: Secondary | ICD-10-CM | POA: Diagnosis not present

## 2023-07-17 DIAGNOSIS — E785 Hyperlipidemia, unspecified: Secondary | ICD-10-CM | POA: Diagnosis not present

## 2023-07-17 DIAGNOSIS — R7881 Bacteremia: Secondary | ICD-10-CM | POA: Diagnosis not present

## 2023-07-17 DIAGNOSIS — R1310 Dysphagia, unspecified: Secondary | ICD-10-CM | POA: Diagnosis not present

## 2023-07-17 DIAGNOSIS — G4733 Obstructive sleep apnea (adult) (pediatric): Secondary | ICD-10-CM | POA: Diagnosis not present

## 2023-07-17 DIAGNOSIS — J441 Chronic obstructive pulmonary disease with (acute) exacerbation: Secondary | ICD-10-CM | POA: Diagnosis not present

## 2023-07-17 DIAGNOSIS — J9601 Acute respiratory failure with hypoxia: Secondary | ICD-10-CM | POA: Diagnosis not present

## 2023-07-17 DIAGNOSIS — N179 Acute kidney failure, unspecified: Secondary | ICD-10-CM | POA: Diagnosis not present

## 2023-07-17 LAB — I-STAT VENOUS BLOOD GAS, ED
Acid-Base Excess: 0 mmol/L (ref 0.0–2.0)
Bicarbonate: 23 mmol/L (ref 20.0–28.0)
Calcium, Ion: 1.08 mmol/L — ABNORMAL LOW (ref 1.15–1.40)
HCT: 37 % (ref 36.0–46.0)
Hemoglobin: 12.6 g/dL (ref 12.0–15.0)
O2 Saturation: 61 %
Potassium: 4.7 mmol/L (ref 3.5–5.1)
Sodium: 138 mmol/L (ref 135–145)
TCO2: 24 mmol/L (ref 22–32)
pCO2, Ven: 32.3 mm[Hg] — ABNORMAL LOW (ref 44–60)
pH, Ven: 7.461 — ABNORMAL HIGH (ref 7.25–7.43)
pO2, Ven: 29 mm[Hg] — CL (ref 32–45)

## 2023-07-17 LAB — COMPREHENSIVE METABOLIC PANEL
ALT: 19 U/L (ref 0–44)
AST: 23 U/L (ref 15–41)
Albumin: 3.8 g/dL (ref 3.5–5.0)
Alkaline Phosphatase: 92 U/L (ref 38–126)
Anion gap: 11 (ref 5–15)
BUN: 24 mg/dL — ABNORMAL HIGH (ref 6–20)
CO2: 21 mmol/L — ABNORMAL LOW (ref 22–32)
Calcium: 9.2 mg/dL (ref 8.9–10.3)
Chloride: 105 mmol/L (ref 98–111)
Creatinine, Ser: 1.2 mg/dL — ABNORMAL HIGH (ref 0.44–1.00)
GFR, Estimated: 54 mL/min — ABNORMAL LOW (ref >60)
Glucose, Bld: 136 mg/dL — ABNORMAL HIGH (ref 70–99)
Potassium: 5.2 mmol/L — ABNORMAL HIGH (ref 3.5–5.1)
Sodium: 137 mmol/L (ref 135–145)
Total Bilirubin: 0.9 mg/dL (ref 0.3–1.2)
Total Protein: 7.6 g/dL (ref 6.5–8.1)

## 2023-07-17 LAB — CBC WITH DIFFERENTIAL/PLATELET
Abs Immature Granulocytes: 0.02 10*3/uL (ref 0.00–0.07)
Basophils Absolute: 0 10*3/uL (ref 0.0–0.1)
Basophils Relative: 0 %
Eosinophils Absolute: 0.1 10*3/uL (ref 0.0–0.5)
Eosinophils Relative: 1 %
HCT: 37.1 % (ref 36.0–46.0)
Hemoglobin: 11.8 g/dL — ABNORMAL LOW (ref 12.0–15.0)
Immature Granulocytes: 0 %
Lymphocytes Relative: 49 %
Lymphs Abs: 4.3 10*3/uL — ABNORMAL HIGH (ref 0.7–4.0)
MCH: 27.5 pg (ref 26.0–34.0)
MCHC: 31.8 g/dL (ref 30.0–36.0)
MCV: 86.5 fL (ref 80.0–100.0)
Monocytes Absolute: 0.5 10*3/uL (ref 0.1–1.0)
Monocytes Relative: 6 %
Neutro Abs: 3.8 10*3/uL (ref 1.7–7.7)
Neutrophils Relative %: 44 %
Platelets: 387 10*3/uL (ref 150–400)
RBC: 4.29 MIL/uL (ref 3.87–5.11)
RDW: 14 % (ref 11.5–15.5)
WBC: 8.6 10*3/uL (ref 4.0–10.5)
nRBC: 0 % (ref 0.0–0.2)

## 2023-07-17 LAB — TROPONIN I (HIGH SENSITIVITY)
Troponin I (High Sensitivity): 4 ng/L (ref <18)
Troponin I (High Sensitivity): 5 ng/L (ref <18)

## 2023-07-17 LAB — URINALYSIS, ROUTINE W REFLEX MICROSCOPIC
Bilirubin Urine: NEGATIVE
Glucose, UA: NEGATIVE mg/dL
Hgb urine dipstick: NEGATIVE
Ketones, ur: NEGATIVE mg/dL
Leukocytes,Ua: NEGATIVE
Nitrite: NEGATIVE
Protein, ur: NEGATIVE mg/dL
Specific Gravity, Urine: 1.041 — ABNORMAL HIGH (ref 1.005–1.030)
pH: 5 (ref 5.0–8.0)

## 2023-07-17 LAB — CBG MONITORING, ED: Glucose-Capillary: 110 mg/dL — ABNORMAL HIGH (ref 70–99)

## 2023-07-17 MED ORDER — ACETAMINOPHEN 500 MG PO TABS
1000.0000 mg | ORAL_TABLET | Freq: Once | ORAL | Status: AC
  Administered 2023-07-17: 1000 mg via ORAL
  Filled 2023-07-17: qty 2

## 2023-07-17 MED ORDER — ONDANSETRON HCL 4 MG/2ML IJ SOLN
4.0000 mg | Freq: Once | INTRAMUSCULAR | Status: AC
  Administered 2023-07-17: 4 mg via INTRAVENOUS
  Filled 2023-07-17: qty 2

## 2023-07-17 MED ORDER — FENTANYL CITRATE PF 50 MCG/ML IJ SOSY
50.0000 ug | PREFILLED_SYRINGE | INTRAMUSCULAR | Status: AC | PRN
  Administered 2023-07-17 (×3): 50 ug via INTRAVENOUS
  Filled 2023-07-17 (×3): qty 1

## 2023-07-17 MED ORDER — MORPHINE SULFATE (PF) 4 MG/ML IV SOLN
6.0000 mg | Freq: Once | INTRAVENOUS | Status: AC
  Administered 2023-07-17: 6 mg via INTRAVENOUS
  Filled 2023-07-17: qty 2

## 2023-07-17 MED ORDER — IOHEXOL 350 MG/ML SOLN
75.0000 mL | Freq: Once | INTRAVENOUS | Status: AC | PRN
Start: 2023-07-17 — End: 2023-07-17
  Administered 2023-07-17: 75 mL via INTRAVENOUS

## 2023-07-17 MED ORDER — DIAZEPAM 5 MG/ML IJ SOLN
5.0000 mg | Freq: Once | INTRAMUSCULAR | Status: AC
  Administered 2023-07-17: 5 mg via INTRAVENOUS
  Filled 2023-07-17: qty 2

## 2023-07-17 MED ORDER — FAMOTIDINE 20 MG PO TABS
20.0000 mg | ORAL_TABLET | Freq: Once | ORAL | Status: AC
  Administered 2023-07-17: 20 mg via ORAL
  Filled 2023-07-17: qty 1

## 2023-07-17 NOTE — ED Provider Triage Note (Signed)
Emergency Medicine Provider Triage Evaluation Note  [name] , a 52 y.o. female  was evaluated in triage.  Pt complains of multiple episodes syncope over one week with falls and hitting her head, now with headache and neck pain, photophobia, chest pain, SOB.  Review of Systems  Positive: As above Negative: Fever, vomiting  Physical Exam  BP (!) 171/95 (BP Location: Right Arm)   Pulse (!) 116   Temp 98.2 F (36.8 C) (Oral)   Resp (!) 22   LMP  (LMP Unknown)   SpO2 100%  Gen:   Awake, no distress  Uncomfortable appearing Resp:  Normal effort Trach in place, no respiratory distress MSK:   Moves extremities without difficulty  Other:    Medical Decision Making  Medically screening exam initiated at 3:18 PM.  Appropriate orders placed.  [name] was informed that the remainder of the evaluation will be completed by another provider, this initial triage assessment does not replace that evaluation, and the importance of remaining in the ED until their evaluation is complete.  Patient with multiple complaints as above. Labs and imaging ordered. Tachycardic on arrival, tachypneic. Uncomfortable appearing, stable.    Elpidio Anis, PA-C 07/17/23 1520

## 2023-07-17 NOTE — ED Provider Notes (Addendum)
[place] [facility] DEPARTMENT AT [facility] Provider Note   CSN: [id number] Arrival date & time: 07/17/23  1458     History Chief Complaint  Patient presents with   Chest Pain   Loss of Consciousness    [name] is a 52 y.o. female presenting for multiple symptoms.  52 year old female extensive medical history including Hypertension, hyperlipidemia, chronic heart failure, sleep apnea, chronic hypoxic respiratory failure now status post tracheostomy, chronic pain, psychiatric disease and diabetes who is presenting with a chief complaint of 3 syncopal events over the past week.  Very poor p.o. intake in the setting of longstanding history of gastritis.  Patient's recorded medical, surgical, social, medication list and allergies were reviewed in the Snapshot window as part of the initial history.   Review of Systems   Review of Systems  Constitutional:  Negative for chills and fever.  HENT:  Negative for ear pain and sore throat.   Eyes:  Negative for pain and visual disturbance.  Respiratory:  Negative for cough and shortness of breath.   Cardiovascular:  Positive for chest pain. Negative for palpitations.  Gastrointestinal:  Negative for abdominal pain and vomiting.  Genitourinary:  Negative for dysuria and hematuria.  Musculoskeletal:  Negative for arthralgias and back pain.  Skin:  Negative for color change and rash.  Neurological:  Negative for seizures and syncope.  All other systems reviewed and are negative.   Physical Exam Updated Vital Signs BP (!) 178/89 (BP Location: Right Arm)   Pulse (!) 120   Temp 98.1 F (36.7 C) (Oral)   Resp 20   Ht 5\' 1"  (1.549 m)   Wt 109.8 kg   LMP  (LMP Unknown)   SpO2 99%   BMI 45.73 kg/m  Physical Exam Vitals and nursing note reviewed.  Constitutional:      General: She is not in acute distress.    Appearance: She is well-developed.  HENT:     Head: Normocephalic and atraumatic.  Eyes:      Conjunctiva/sclera: Conjunctivae normal.  Cardiovascular:     Rate and Rhythm: Normal rate and regular rhythm.     Heart sounds: No murmur heard. Pulmonary:     Effort: Pulmonary effort is normal. No respiratory distress.     Breath sounds: Normal breath sounds.  Abdominal:     General: There is no distension.     Palpations: Abdomen is soft.     Tenderness: There is no abdominal tenderness. There is no right CVA tenderness or left CVA tenderness.  Musculoskeletal:        General: No swelling or tenderness. Normal range of motion.     Cervical back: Neck supple.  Skin:    General: Skin is warm and dry.  Neurological:     General: No focal deficit present.     Mental Status: She is alert and oriented to person, place, and time. Mental status is at baseline.     Cranial Nerves: No cranial nerve deficit.      ED Course/ Medical Decision Making/ A&P    Procedures .Critical Care  Performed by: Glyn Ade, MD Authorized by: Glyn Ade, MD   Critical care provider statement:    Critical care time (minutes):  30   Critical care was necessary to treat or prevent imminent or life-threatening deterioration of the following conditions: Refractory pain.   Critical care was time spent personally by me on the following activities:  Development of treatment plan with patient or  surrogate, discussions with consultants, evaluation of patient's response to treatment, examination of patient, ordering and review of laboratory studies, ordering and review of radiographic studies, ordering and performing treatments and interventions, pulse oximetry, re-evaluation of patient's condition and review of old charts    Medications Ordered in ED Medications  diazepam (VALIUM) injection 5 mg (has no administration in time range)  famotidine (PEPCID) tablet 20 mg (has no administration in time range)  acetaminophen (TYLENOL) tablet 1,000 mg (1,000 mg Oral Given 07/17/23 1716)  fentaNYL  (SUBLIMAZE) injection 50 mcg (50 mcg Intravenous Given 07/17/23 2017)  iohexol (OMNIPAQUE) 350 MG/ML injection 75 mL (75 mLs Intravenous Contrast Given 07/17/23 1939)  morphine (PF) 4 MG/ML injection 6 mg (6 mg Intravenous Given 07/17/23 2040)  ondansetron (ZOFRAN) injection 4 mg (4 mg Intravenous Given 07/17/23 2040)   Medical Decision Making:   [name] is a 52 y.o. female who presented to the ED today with a syncopal episode detailed above.    Additional history discussed with patient's family/caregivers.  Patient placed on continuous vitals and telemetry monitoring while in ED which was reviewed periodically.  Complete initial physical exam performed, notably the patient  was in NAD, HDS.    Reviewed and confirmed nursing documentation for past medical history, family history, social history.    Initial Assessment:   With the patient's presentation of syncope, most likely diagnosis is orthostatic hypotension vs vasovagal episode. Other diagnoses were considered including (but not limited to) arrythmogenic syncope, valvular abnormality, PE, aortic dissection. These are considered less likely due to history of present illness and physical exam findings.   This is most consistent with an acute life/limb threatening illness complicated by underlying chronic conditions.  Initial Plan:  CTA PE to evaluate for thromboembolic disease Screening labs including CBC and Metabolic panel to evaluate for infectious or metabolic etiology of disease.  Urinalysis with reflex culture ordered to evaluate for UTI or relevant urologic/nephrologic pathology.  Troponin/BNP/CXR to evaluate for structural/infectious intrathoracic pathology.  EKG to evaluate for cardiac pathology. Objective evaluation as below reviewed after administration of IVF/Telemetry monitoring  Initial Study Results:   Laboratory  All laboratory results reviewed without evidence of clinically relevant pathology.    Exception includes borderline hyperkalemia (hemolyzed sample) EKG EKG was reviewed independently. Rate, rhythm, axis, intervals all examined and without medically relevant abnormality. ST segments without concerns for elevations.    Radiology:  All images reviewed independently. Agree with radiology report at this time.   CT Angio Chest Pulmonary Embolism (PE) W or WO Contrast  Result Date: 07/17/2023 CLINICAL DATA:  High probability for PE EXAM: CT ANGIOGRAPHY CHEST WITH CONTRAST TECHNIQUE: Multidetector CT imaging of the chest was performed using the standard protocol during bolus administration of intravenous contrast. Multiplanar CT image reconstructions and MIPs were obtained to evaluate the vascular anatomy. RADIATION DOSE REDUCTION: This exam was performed according to the departmental dose-optimization program which includes automated exposure control, adjustment of the mA and/or kV according to patient size and/or use of iterative reconstruction technique. CONTRAST:  75mL OMNIPAQUE IOHEXOL 350 MG/ML SOLN COMPARISON:  CT angiogram chest abdomen and pelvis 03/10/2023 FINDINGS: Cardiovascular: Satisfactory opacification of the pulmonary arteries to the segmental level. No evidence of pulmonary embolism. Normal heart size. No pericardial effusion. Mediastinum/Nodes: No enlarged mediastinal, hilar, or axillary lymph nodes. Thyroid gland, trachea, and esophagus demonstrate no significant abnormality. Tracheostomy in place. Lungs/Pleura: Lungs are clear. No pleural effusion or pneumothorax. Upper Abdomen: No acute abnormality. Musculoskeletal: No chest wall abnormality. No acute  or significant osseous findings. Review of the MIP images confirms the above findings. IMPRESSION: 1. No evidence for pulmonary embolism or other acute intrathoracic process. 2. Tracheostomy in place. Electronically Signed   By: Darliss Cheney M.D.   On: 07/17/2023 20:55   CT Head Wo Contrast  Result Date:  07/17/2023 CLINICAL DATA:  Head trauma, moderate-severe; Neck trauma, dangerous injury mechanism (Age 58-64y) EXAM: CT HEAD WITHOUT CONTRAST CT CERVICAL SPINE WITHOUT CONTRAST TECHNIQUE: Multidetector CT imaging of the head and cervical spine was performed following the standard protocol without intravenous contrast. Multiplanar CT image reconstructions of the cervical spine were also generated. RADIATION DOSE REDUCTION: This exam was performed according to the departmental dose-optimization program which includes automated exposure control, adjustment of the mA and/or kV according to patient size and/or use of iterative reconstruction technique. COMPARISON:  None Available. FINDINGS: CT HEAD FINDINGS Brain: No evidence of acute infarction, hemorrhage, hydrocephalus, extra-axial collection or mass lesion/mass effect. Partially empty sella. Vascular: No hyperdense vessel. Skull: No acute fracture. Sinuses/Orbits: Clear sinuses.  No acute orbital findings. Other: No mastoid effusions. CT CERVICAL SPINE FINDINGS Alignment: Straightening.  No substantial sagittal subluxation. Skull base and vertebrae: No acute fracture. No primary bone lesion or focal pathologic process. Soft tissues and spinal canal: No prevertebral fluid or swelling. No visible canal hematoma. Disc levels:  No significant bony degenerative change. Upper chest: Visualized lung apices are clear. IMPRESSION: No evidence of acute abnormality intracranially or in the cervical spine. Electronically Signed   By: Feliberto Harts M.D.   On: 07/17/2023 17:11   CT Cervical Spine Wo Contrast  Result Date: 07/17/2023 CLINICAL DATA:  Head trauma, moderate-severe; Neck trauma, dangerous injury mechanism (Age 47-64y) EXAM: CT HEAD WITHOUT CONTRAST CT CERVICAL SPINE WITHOUT CONTRAST TECHNIQUE: Multidetector CT imaging of the head and cervical spine was performed following the standard protocol without intravenous contrast. Multiplanar CT image reconstructions  of the cervical spine were also generated. RADIATION DOSE REDUCTION: This exam was performed according to the departmental dose-optimization program which includes automated exposure control, adjustment of the mA and/or kV according to patient size and/or use of iterative reconstruction technique. COMPARISON:  None Available. FINDINGS: CT HEAD FINDINGS Brain: No evidence of acute infarction, hemorrhage, hydrocephalus, extra-axial collection or mass lesion/mass effect. Partially empty sella. Vascular: No hyperdense vessel. Skull: No acute fracture. Sinuses/Orbits: Clear sinuses.  No acute orbital findings. Other: No mastoid effusions. CT CERVICAL SPINE FINDINGS Alignment: Straightening.  No substantial sagittal subluxation. Skull base and vertebrae: No acute fracture. No primary bone lesion or focal pathologic process. Soft tissues and spinal canal: No prevertebral fluid or swelling. No visible canal hematoma. Disc levels:  No significant bony degenerative change. Upper chest: Visualized lung apices are clear. IMPRESSION: No evidence of acute abnormality intracranially or in the cervical spine. Electronically Signed   By: Feliberto Harts M.D.   On: 07/17/2023 17:11   DG Chest 2 View  Result Date: 07/17/2023 CLINICAL DATA:  Shortness of breath. EXAM: CHEST - 2 VIEW COMPARISON:  01/28/2023. FINDINGS: Low lung volume. Again seen is elevated right hemidiaphragm. Bilateral lung fields are clear. Bilateral costophrenic angles are clear. Normal cardio-mediastinal silhouette. No acute osseous abnormalities. The soft tissues are within normal limits. Tracheostomy tube seen with its tip approximately 6.3 cm above the carina. There are surgical clips in the right upper quadrant, typical of a previous cholecystectomy. IMPRESSION: *No active cardiopulmonary disease. Electronically Signed   By: Jules Schick M.D.   On: 07/17/2023 16:55   CT Soft Tissue  Neck Wo Contrast  Result Date: 06/28/2023 CLINICAL DATA:   Epiglottitis or tonsillitis suspected.  Neck pain EXAM: CT NECK WITHOUT CONTRAST TECHNIQUE: Multidetector CT imaging of the neck was performed following the standard protocol without intravenous contrast. RADIATION DOSE REDUCTION: This exam was performed according to the departmental dose-optimization program which includes automated exposure control, adjustment of the mA and/or kV according to patient size and/or use of iterative reconstruction technique. COMPARISON:  06/16/2023 FINDINGS: Pharynx and larynx: Normal. No mass or swelling. Salivary glands: No inflammation, mass, or stone. Thyroid: Normal. Lymph nodes: None enlarged or abnormal density. Vascular: Negative. Limited intracranial: Negative Visualized orbits: Negative Mastoids and visualized paranasal sinuses: Clear. Skeleton: Unremarkable Upper chest: Tracheostomy tube that is located. No regional inflammation is seen. Other: IMPRESSION: No explanation for symptoms. No visible inflammation in the neck. Electronically Signed   By: Tiburcio Pea M.D.   On: 06/28/2023 12:36   Final Assessment and Plan:   Patient observed in the emergency department for 7 hours.  Expansive administration of medications to try to get her pain under control but no medication resulted in any symptomatic relief.  She states that her pain now feels like muscle spasms in her right pectoralis major.  She does not have evidence of ACS on serial troponin or EKG testing, she does not have a pulmonary embolism, musculoskeletal injury or any other severe pathology.  No mediastinitis or complication from her tracheostomy detected on the studies. ACS also considered less likely as she just had a nuclear medicine study that did not show any cardiac disease. Will trial Valium for muscle relaxation as she is now describing a muscle spasm in her chest. Patient did report some moderate improvement after this therapy. Patient will need to be reassessed within 48 hours by PCP.  She does  not have EKG changes consistent with true hyperkalemia.  I believe patient is stable for follow-up in the outpatient setting with her PCP within 48 hours.  Continuation of her home pain medication regimens and strict return precautions regarding interval changes in her syndrome. Clinical Impression:  1. Atypical chest pain      Discharge    Final Clinical Impression(s) / ED Diagnoses Final diagnoses:  Atypical chest pain    Rx / DC Orders ED Discharge Orders     None         Glyn Ade, MD 07/17/23 2136    Glyn Ade, MD 07/17/23 2137

## 2023-07-17 NOTE — ED Triage Notes (Signed)
Pt here from home multiple complaints including syncopal episodes all week including chest pain that started today  , fell and hit her head on Wednesday of this week

## 2023-07-18 ENCOUNTER — Other Ambulatory Visit: Payer: Self-pay

## 2023-07-18 ENCOUNTER — Emergency Department (HOSPITAL_COMMUNITY): Payer: 59

## 2023-07-18 ENCOUNTER — Inpatient Hospital Stay (HOSPITAL_COMMUNITY)
Admission: EM | Admit: 2023-07-18 | Discharge: 2023-07-21 | DRG: 208 | Disposition: A | Discharge status: Home / Self Care | Payer: 59 | Attending: Internal Medicine | Admitting: Internal Medicine

## 2023-07-18 DIAGNOSIS — E785 Hyperlipidemia, unspecified: Secondary | ICD-10-CM | POA: Diagnosis present

## 2023-07-18 DIAGNOSIS — Z6841 Body Mass Index (BMI) 40.0 and over, adult: Secondary | ICD-10-CM

## 2023-07-18 DIAGNOSIS — Z9981 Dependence on supplemental oxygen: Secondary | ICD-10-CM

## 2023-07-18 DIAGNOSIS — J398 Other specified diseases of upper respiratory tract: Secondary | ICD-10-CM | POA: Diagnosis present

## 2023-07-18 DIAGNOSIS — D72829 Elevated white blood cell count, unspecified: Secondary | ICD-10-CM | POA: Diagnosis present

## 2023-07-18 DIAGNOSIS — K3184 Gastroparesis: Secondary | ICD-10-CM | POA: Diagnosis present

## 2023-07-18 DIAGNOSIS — Z888 Allergy status to other drugs, medicaments and biological substances status: Secondary | ICD-10-CM

## 2023-07-18 DIAGNOSIS — J9601 Acute respiratory failure with hypoxia: Principal | ICD-10-CM

## 2023-07-18 DIAGNOSIS — Z794 Long term (current) use of insulin: Secondary | ICD-10-CM

## 2023-07-18 DIAGNOSIS — Z1152 Encounter for screening for COVID-19: Secondary | ICD-10-CM

## 2023-07-18 DIAGNOSIS — I110 Hypertensive heart disease with heart failure: Secondary | ICD-10-CM | POA: Diagnosis present

## 2023-07-18 DIAGNOSIS — Z87891 Personal history of nicotine dependence: Secondary | ICD-10-CM

## 2023-07-18 DIAGNOSIS — F32A Depression, unspecified: Secondary | ICD-10-CM | POA: Diagnosis present

## 2023-07-18 DIAGNOSIS — Z825 Family history of asthma and other chronic lower respiratory diseases: Secondary | ICD-10-CM

## 2023-07-18 DIAGNOSIS — E8729 Other acidosis: Secondary | ICD-10-CM | POA: Diagnosis present

## 2023-07-18 DIAGNOSIS — F419 Anxiety disorder, unspecified: Secondary | ICD-10-CM | POA: Diagnosis present

## 2023-07-18 DIAGNOSIS — E66813 Obesity, class 3: Secondary | ICD-10-CM | POA: Diagnosis present

## 2023-07-18 DIAGNOSIS — Z833 Family history of diabetes mellitus: Secondary | ICD-10-CM

## 2023-07-18 DIAGNOSIS — Z7951 Long term (current) use of inhaled steroids: Secondary | ICD-10-CM

## 2023-07-18 DIAGNOSIS — R1310 Dysphagia, unspecified: Secondary | ICD-10-CM | POA: Diagnosis present

## 2023-07-18 DIAGNOSIS — J4551 Severe persistent asthma with (acute) exacerbation: Principal | ICD-10-CM | POA: Diagnosis present

## 2023-07-18 DIAGNOSIS — K219 Gastro-esophageal reflux disease without esophagitis: Secondary | ICD-10-CM | POA: Diagnosis present

## 2023-07-18 DIAGNOSIS — Z8261 Family history of arthritis: Secondary | ICD-10-CM

## 2023-07-18 DIAGNOSIS — R55 Syncope and collapse: Secondary | ICD-10-CM | POA: Diagnosis present

## 2023-07-18 DIAGNOSIS — E109 Type 1 diabetes mellitus without complications: Secondary | ICD-10-CM

## 2023-07-18 DIAGNOSIS — M545 Low back pain, unspecified: Secondary | ICD-10-CM | POA: Diagnosis present

## 2023-07-18 DIAGNOSIS — Z823 Family history of stroke: Secondary | ICD-10-CM

## 2023-07-18 DIAGNOSIS — N390 Urinary tract infection, site not specified: Secondary | ICD-10-CM | POA: Diagnosis present

## 2023-07-18 DIAGNOSIS — Z8249 Family history of ischemic heart disease and other diseases of the circulatory system: Secondary | ICD-10-CM

## 2023-07-18 DIAGNOSIS — E1143 Type 2 diabetes mellitus with diabetic autonomic (poly)neuropathy: Secondary | ICD-10-CM | POA: Diagnosis present

## 2023-07-18 DIAGNOSIS — E873 Alkalosis: Secondary | ICD-10-CM | POA: Diagnosis present

## 2023-07-18 DIAGNOSIS — J441 Chronic obstructive pulmonary disease with (acute) exacerbation: Secondary | ICD-10-CM | POA: Diagnosis present

## 2023-07-18 DIAGNOSIS — J9621 Acute and chronic respiratory failure with hypoxia: Secondary | ICD-10-CM | POA: Diagnosis present

## 2023-07-18 DIAGNOSIS — G4733 Obstructive sleep apnea (adult) (pediatric): Secondary | ICD-10-CM | POA: Diagnosis present

## 2023-07-18 DIAGNOSIS — N179 Acute kidney failure, unspecified: Secondary | ICD-10-CM | POA: Diagnosis present

## 2023-07-18 DIAGNOSIS — Z930 Tracheostomy status: Secondary | ICD-10-CM

## 2023-07-18 DIAGNOSIS — I5032 Chronic diastolic (congestive) heart failure: Secondary | ICD-10-CM | POA: Diagnosis present

## 2023-07-18 DIAGNOSIS — E119 Type 2 diabetes mellitus without complications: Secondary | ICD-10-CM

## 2023-07-18 DIAGNOSIS — G894 Chronic pain syndrome: Secondary | ICD-10-CM | POA: Diagnosis present

## 2023-07-18 DIAGNOSIS — R7881 Bacteremia: Secondary | ICD-10-CM | POA: Diagnosis present

## 2023-07-18 DIAGNOSIS — Z79899 Other long term (current) drug therapy: Secondary | ICD-10-CM

## 2023-07-18 DIAGNOSIS — I517 Cardiomegaly: Secondary | ICD-10-CM | POA: Diagnosis not present

## 2023-07-18 DIAGNOSIS — Z0389 Encounter for observation for other suspected diseases and conditions ruled out: Secondary | ICD-10-CM | POA: Diagnosis not present

## 2023-07-18 LAB — COMPREHENSIVE METABOLIC PANEL
ALT: 23 U/L (ref 0–44)
AST: 23 U/L (ref 15–41)
Albumin: 4.2 g/dL (ref 3.5–5.0)
Alkaline Phosphatase: 114 U/L (ref 38–126)
Anion gap: 18 — ABNORMAL HIGH (ref 5–15)
BUN: 18 mg/dL (ref 6–20)
CO2: 20 mmol/L — ABNORMAL LOW (ref 22–32)
Calcium: 10.1 mg/dL (ref 8.9–10.3)
Chloride: 101 mmol/L (ref 98–111)
Creatinine, Ser: 1.88 mg/dL — ABNORMAL HIGH (ref 0.44–1.00)
GFR, Estimated: 32 mL/min — ABNORMAL LOW (ref >60)
Glucose, Bld: 267 mg/dL — ABNORMAL HIGH (ref 70–99)
Potassium: 3.9 mmol/L (ref 3.5–5.1)
Sodium: 139 mmol/L (ref 135–145)
Total Bilirubin: 0.7 mg/dL (ref 0.3–1.2)
Total Protein: 8.7 g/dL — ABNORMAL HIGH (ref 6.5–8.1)

## 2023-07-18 LAB — CBC WITH DIFFERENTIAL/PLATELET
Abs Immature Granulocytes: 0.04 10*3/uL (ref 0.00–0.07)
Basophils Absolute: 0 10*3/uL (ref 0.0–0.1)
Basophils Relative: 0 %
Eosinophils Absolute: 0.1 10*3/uL (ref 0.0–0.5)
Eosinophils Relative: 1 %
HCT: 38.5 % (ref 36.0–46.0)
Hemoglobin: 12.4 g/dL (ref 12.0–15.0)
Immature Granulocytes: 0 %
Lymphocytes Relative: 46 %
Lymphs Abs: 6.9 10*3/uL — ABNORMAL HIGH (ref 0.7–4.0)
MCH: 27.6 pg (ref 26.0–34.0)
MCHC: 32.2 g/dL (ref 30.0–36.0)
MCV: 85.6 fL (ref 80.0–100.0)
Monocytes Absolute: 0.8 10*3/uL (ref 0.1–1.0)
Monocytes Relative: 5 %
Neutro Abs: 7.1 10*3/uL (ref 1.7–7.7)
Neutrophils Relative %: 48 %
Platelets: 432 10*3/uL — ABNORMAL HIGH (ref 150–400)
RBC: 4.5 MIL/uL (ref 3.87–5.11)
RDW: 14.1 % (ref 11.5–15.5)
WBC: 14.9 10*3/uL — ABNORMAL HIGH (ref 4.0–10.5)
nRBC: 0 % (ref 0.0–0.2)

## 2023-07-18 LAB — I-STAT CHEM 8, ED
BUN: 20 mg/dL (ref 6–20)
Calcium, Ion: 1.22 mmol/L (ref 1.15–1.40)
Chloride: 104 mmol/L (ref 98–111)
Creatinine, Ser: 2 mg/dL — ABNORMAL HIGH (ref 0.44–1.00)
Glucose, Bld: 265 mg/dL — ABNORMAL HIGH (ref 70–99)
HCT: 41 % (ref 36.0–46.0)
Hemoglobin: 13.9 g/dL (ref 12.0–15.0)
Potassium: 3.9 mmol/L (ref 3.5–5.1)
Sodium: 142 mmol/L (ref 135–145)
TCO2: 23 mmol/L (ref 22–32)

## 2023-07-18 LAB — I-STAT CG4 LACTIC ACID, ED: Lactic Acid, Venous: 3.7 mmol/L (ref 0.5–1.9)

## 2023-07-18 LAB — PROTIME-INR
INR: 1 (ref 0.8–1.2)
Prothrombin Time: 13.8 s (ref 11.4–15.2)

## 2023-07-18 LAB — TROPONIN I (HIGH SENSITIVITY): Troponin I (High Sensitivity): 7 ng/L (ref <18)

## 2023-07-18 LAB — APTT: aPTT: 25 s (ref 24–36)

## 2023-07-18 NOTE — ED Triage Notes (Signed)
Patient arrived with EMS from home this evening , worsening SOB , she has a tracheostomy , received 2 Albuterol / Atrovent nebulizer treatments by EMS prior to arrival .

## 2023-07-19 DIAGNOSIS — K3184 Gastroparesis: Secondary | ICD-10-CM | POA: Diagnosis present

## 2023-07-19 DIAGNOSIS — D72829 Elevated white blood cell count, unspecified: Secondary | ICD-10-CM | POA: Diagnosis present

## 2023-07-19 DIAGNOSIS — R7881 Bacteremia: Secondary | ICD-10-CM | POA: Diagnosis present

## 2023-07-19 DIAGNOSIS — Z6841 Body Mass Index (BMI) 40.0 and over, adult: Secondary | ICD-10-CM | POA: Diagnosis not present

## 2023-07-19 DIAGNOSIS — G894 Chronic pain syndrome: Secondary | ICD-10-CM | POA: Diagnosis present

## 2023-07-19 DIAGNOSIS — J398 Other specified diseases of upper respiratory tract: Secondary | ICD-10-CM | POA: Diagnosis present

## 2023-07-19 DIAGNOSIS — I5032 Chronic diastolic (congestive) heart failure: Secondary | ICD-10-CM | POA: Diagnosis present

## 2023-07-19 DIAGNOSIS — J9621 Acute and chronic respiratory failure with hypoxia: Secondary | ICD-10-CM | POA: Diagnosis present

## 2023-07-19 DIAGNOSIS — J441 Chronic obstructive pulmonary disease with (acute) exacerbation: Secondary | ICD-10-CM

## 2023-07-19 DIAGNOSIS — Z794 Long term (current) use of insulin: Secondary | ICD-10-CM | POA: Diagnosis not present

## 2023-07-19 DIAGNOSIS — Z1152 Encounter for screening for COVID-19: Secondary | ICD-10-CM | POA: Diagnosis not present

## 2023-07-19 DIAGNOSIS — E8729 Other acidosis: Secondary | ICD-10-CM | POA: Diagnosis present

## 2023-07-19 DIAGNOSIS — E873 Alkalosis: Secondary | ICD-10-CM | POA: Diagnosis present

## 2023-07-19 DIAGNOSIS — F419 Anxiety disorder, unspecified: Secondary | ICD-10-CM | POA: Diagnosis present

## 2023-07-19 DIAGNOSIS — K219 Gastro-esophageal reflux disease without esophagitis: Secondary | ICD-10-CM | POA: Diagnosis present

## 2023-07-19 DIAGNOSIS — N179 Acute kidney failure, unspecified: Secondary | ICD-10-CM | POA: Diagnosis present

## 2023-07-19 DIAGNOSIS — F32A Depression, unspecified: Secondary | ICD-10-CM | POA: Diagnosis present

## 2023-07-19 DIAGNOSIS — I110 Hypertensive heart disease with heart failure: Secondary | ICD-10-CM | POA: Diagnosis present

## 2023-07-19 DIAGNOSIS — R1310 Dysphagia, unspecified: Secondary | ICD-10-CM | POA: Diagnosis present

## 2023-07-19 DIAGNOSIS — G4733 Obstructive sleep apnea (adult) (pediatric): Secondary | ICD-10-CM | POA: Diagnosis present

## 2023-07-19 DIAGNOSIS — E1143 Type 2 diabetes mellitus with diabetic autonomic (poly)neuropathy: Secondary | ICD-10-CM | POA: Diagnosis present

## 2023-07-19 DIAGNOSIS — N390 Urinary tract infection, site not specified: Secondary | ICD-10-CM | POA: Diagnosis present

## 2023-07-19 DIAGNOSIS — Z930 Tracheostomy status: Secondary | ICD-10-CM | POA: Diagnosis not present

## 2023-07-19 DIAGNOSIS — J9601 Acute respiratory failure with hypoxia: Secondary | ICD-10-CM | POA: Diagnosis present

## 2023-07-19 DIAGNOSIS — E785 Hyperlipidemia, unspecified: Secondary | ICD-10-CM | POA: Diagnosis present

## 2023-07-19 DIAGNOSIS — J4551 Severe persistent asthma with (acute) exacerbation: Secondary | ICD-10-CM | POA: Diagnosis present

## 2023-07-19 LAB — PROCALCITONIN: Procalcitonin: 0.17 ng/mL

## 2023-07-19 LAB — I-STAT CG4 LACTIC ACID, ED
Lactic Acid, Venous: 2.5 mmol/L (ref 0.5–1.9)
Lactic Acid, Venous: 1 mmol/L (ref 0.5–1.9)

## 2023-07-19 LAB — GLUCOSE, CAPILLARY
Glucose-Capillary: 258 mg/dL — ABNORMAL HIGH (ref 70–99)
Glucose-Capillary: 293 mg/dL — ABNORMAL HIGH (ref 70–99)

## 2023-07-19 LAB — URINALYSIS, W/ REFLEX TO CULTURE (INFECTION SUSPECTED)
Bilirubin Urine: NEGATIVE
Glucose, UA: 150 mg/dL — AB
Hgb urine dipstick: NEGATIVE
Ketones, ur: 5 mg/dL — AB
Leukocytes,Ua: NEGATIVE
Nitrite: NEGATIVE
Protein, ur: NEGATIVE mg/dL
Specific Gravity, Urine: 1.032 — ABNORMAL HIGH (ref 1.005–1.030)
pH: 5 (ref 5.0–8.0)

## 2023-07-19 LAB — CBG MONITORING, ED
Glucose-Capillary: 192 mg/dL — ABNORMAL HIGH (ref 70–99)
Glucose-Capillary: 210 mg/dL — ABNORMAL HIGH (ref 70–99)

## 2023-07-19 LAB — RESP PANEL BY RT-PCR (RSV, FLU A&B, COVID)  RVPGX2
Influenza A by PCR: NEGATIVE
Influenza B by PCR: NEGATIVE
Resp Syncytial Virus by PCR: NEGATIVE
SARS Coronavirus 2 by RT PCR: NEGATIVE

## 2023-07-19 LAB — BRAIN NATRIURETIC PEPTIDE: B Natriuretic Peptide: 20.1 pg/mL (ref 0.0–100.0)

## 2023-07-19 LAB — TROPONIN I (HIGH SENSITIVITY): Troponin I (High Sensitivity): 7 ng/L (ref <18)

## 2023-07-19 MED ORDER — GABAPENTIN 250 MG/5ML PO SOLN
300.0000 mg | Freq: Three times a day (TID) | ORAL | Status: DC | PRN
  Administered 2023-07-21: 300 mg via ORAL
  Filled 2023-07-19 (×3): qty 6

## 2023-07-19 MED ORDER — PREDNISONE 20 MG PO TABS: 40.0000 mg | ORAL_TABLET | Freq: Every day | ORAL | Status: DC

## 2023-07-19 MED ORDER — OXYCODONE HCL 5 MG PO TABS: 5.0000 mg | ORAL_TABLET | Freq: Three times a day (TID) | ORAL | Status: DC | PRN

## 2023-07-19 MED ORDER — ROSUVASTATIN CALCIUM 20 MG PO TABS
20.0000 mg | ORAL_TABLET | Freq: Every day | ORAL | Status: DC
  Administered 2023-07-19 – 2023-07-21 (×3): 20 mg via ORAL
  Filled 2023-07-19 (×3): qty 1

## 2023-07-19 MED ORDER — OXYCODONE-ACETAMINOPHEN 5-325 MG PO TABS
1.0000 | ORAL_TABLET | Freq: Three times a day (TID) | ORAL | Status: DC | PRN
  Administered 2023-07-19 – 2023-07-20 (×3): 1 via ORAL
  Filled 2023-07-19 (×3): qty 1

## 2023-07-19 MED ORDER — LACTATED RINGERS IV BOLUS: 500.0000 mL | Freq: Once | INTRAVENOUS | Status: DC

## 2023-07-19 MED ORDER — SODIUM CHLORIDE 3 % IN NEBU
4.0000 mL | INHALATION_SOLUTION | Freq: Every day | RESPIRATORY_TRACT | Status: DC
  Administered 2023-07-19 – 2023-07-21 (×2): 4 mL via RESPIRATORY_TRACT
  Filled 2023-07-19 (×4): qty 4

## 2023-07-19 MED ORDER — AMLODIPINE BESYLATE 5 MG PO TABS: 10.0000 mg | ORAL_TABLET | Freq: Every day | ORAL | Status: DC

## 2023-07-19 MED ORDER — INSULIN ASPART 100 UNIT/ML IJ SOLN
0.0000 [IU] | Freq: Three times a day (TID) | INTRAMUSCULAR | Status: DC
  Administered 2023-07-19: 5 [IU] via SUBCUTANEOUS
  Administered 2023-07-19: 3 [IU] via SUBCUTANEOUS
  Administered 2023-07-19 – 2023-07-20 (×2): 8 [IU] via SUBCUTANEOUS
  Administered 2023-07-20: 5 [IU] via SUBCUTANEOUS
  Administered 2023-07-20: 3 [IU] via SUBCUTANEOUS

## 2023-07-19 MED ORDER — OXYCODONE-ACETAMINOPHEN 10-325 MG PO TABS: 1.0000 | ORAL_TABLET | Freq: Three times a day (TID) | ORAL | Status: DC | PRN

## 2023-07-19 MED ORDER — LACTATED RINGERS IV BOLUS
500.0000 mL | Freq: Once | INTRAVENOUS | Status: AC
  Administered 2023-07-19: 500 mL via INTRAVENOUS

## 2023-07-19 MED ORDER — MORPHINE SULFATE (PF) 4 MG/ML IV SOLN
4.0000 mg | Freq: Once | INTRAVENOUS | Status: AC
  Administered 2023-07-19: 4 mg via INTRAVENOUS
  Filled 2023-07-19: qty 1

## 2023-07-19 MED ORDER — AZITHROMYCIN 250 MG PO TABS: 500.0000 mg | ORAL_TABLET | Freq: Every day | ORAL | Status: DC

## 2023-07-19 MED ORDER — SERTRALINE HCL 100 MG PO TABS: 200.0000 mg | ORAL_TABLET | Freq: Every day | ORAL | Status: DC

## 2023-07-19 MED ORDER — PREDNISONE 20 MG PO TABS
40.0000 mg | ORAL_TABLET | Freq: Every day | ORAL | Status: DC
  Administered 2023-07-20 – 2023-07-21 (×2): 40 mg via ORAL
  Filled 2023-07-19 (×2): qty 2

## 2023-07-19 MED ORDER — HYDROMORPHONE HCL 1 MG/ML IJ SOLN
1.0000 mg | Freq: Once | INTRAMUSCULAR | Status: AC
  Administered 2023-07-19: 1 mg via INTRAVENOUS
  Filled 2023-07-19: qty 1

## 2023-07-19 MED ORDER — SODIUM CHLORIDE 0.9 % IV SOLN
500.0000 mg | INTRAVENOUS | Status: DC
  Administered 2023-07-19: 500 mg via INTRAVENOUS
  Filled 2023-07-19: qty 5

## 2023-07-19 MED ORDER — OXYCODONE HCL 5 MG PO TABS
5.0000 mg | ORAL_TABLET | Freq: Three times a day (TID) | ORAL | Status: DC | PRN
  Administered 2023-07-19 – 2023-07-20 (×3): 5 mg via ORAL
  Filled 2023-07-19 (×3): qty 1

## 2023-07-19 MED ORDER — SODIUM CHLORIDE 0.9 % IV SOLN
1.0000 g | INTRAVENOUS | Status: DC
  Administered 2023-07-19 – 2023-07-20 (×2): 1 g via INTRAVENOUS
  Filled 2023-07-19 (×2): qty 10

## 2023-07-19 MED ORDER — HYDROXYZINE HCL 25 MG PO TABS
25.0000 mg | ORAL_TABLET | Freq: Three times a day (TID) | ORAL | Status: AC | PRN
  Administered 2023-07-19 – 2023-07-20 (×3): 25 mg via ORAL
  Filled 2023-07-19 (×3): qty 1

## 2023-07-19 MED ORDER — METHYLPREDNISOLONE SODIUM SUCC 40 MG IJ SOLR
40.0000 mg | Freq: Every day | INTRAMUSCULAR | Status: DC
  Administered 2023-07-19: 40 mg via INTRAVENOUS
  Filled 2023-07-19: qty 1

## 2023-07-19 MED ORDER — IPRATROPIUM-ALBUTEROL 0.5-2.5 (3) MG/3ML IN SOLN
3.0000 mL | RESPIRATORY_TRACT | Status: DC
  Administered 2023-07-19: 3 mL via RESPIRATORY_TRACT
  Filled 2023-07-19: qty 3

## 2023-07-19 MED ORDER — GABAPENTIN 300 MG PO CAPS
300.0000 mg | ORAL_CAPSULE | Freq: Three times a day (TID) | ORAL | Status: DC
  Filled 2023-07-19: qty 1

## 2023-07-19 MED ORDER — BUPROPION HCL ER (SR) 150 MG PO TB12
150.0000 mg | ORAL_TABLET | Freq: Two times a day (BID) | ORAL | Status: DC
  Administered 2023-07-19 – 2023-07-21 (×4): 150 mg via ORAL
  Filled 2023-07-19 (×5): qty 1

## 2023-07-19 MED ORDER — ONDANSETRON HCL 4 MG/2ML IJ SOLN
4.0000 mg | Freq: Once | INTRAMUSCULAR | Status: AC
  Administered 2023-07-19: 4 mg via INTRAVENOUS
  Filled 2023-07-19: qty 2

## 2023-07-19 MED ORDER — INSULIN GLARGINE-YFGN 100 UNIT/ML ~~LOC~~ SOLN
15.0000 [IU] | Freq: Every day | SUBCUTANEOUS | Status: DC
  Administered 2023-07-19: 15 [IU] via SUBCUTANEOUS
  Filled 2023-07-19 (×3): qty 0.15

## 2023-07-19 MED ORDER — VANCOMYCIN HCL 2000 MG/400ML IV SOLN
2000.0000 mg | Freq: Once | INTRAVENOUS | Status: AC
  Administered 2023-07-19: 2000 mg via INTRAVENOUS
  Filled 2023-07-19: qty 400

## 2023-07-19 MED ORDER — LORAZEPAM 2 MG/ML IJ SOLN
0.5000 mg | Freq: Once | INTRAMUSCULAR | Status: AC | PRN
  Administered 2023-07-19: 0.5 mg via INTRAVENOUS
  Filled 2023-07-19: qty 1

## 2023-07-19 MED ORDER — PANTOPRAZOLE SODIUM 40 MG PO TBEC
40.0000 mg | DELAYED_RELEASE_TABLET | Freq: Every day | ORAL | Status: DC
Start: 2023-07-19 — End: 2023-07-19

## 2023-07-19 MED ORDER — ENOXAPARIN SODIUM 60 MG/0.6ML IJ SOSY
55.0000 mg | PREFILLED_SYRINGE | INTRAMUSCULAR | Status: DC
Start: 2023-07-19 — End: 2023-07-21
  Administered 2023-07-20 – 2023-07-21 (×2): 55 mg via SUBCUTANEOUS
  Filled 2023-07-19 (×3): qty 0.6

## 2023-07-19 MED ORDER — BUPROPION HCL ER (SR) 150 MG PO TB12: 150.0000 mg | ORAL_TABLET | Freq: Two times a day (BID) | ORAL | Status: DC

## 2023-07-19 MED ORDER — ROSUVASTATIN CALCIUM 20 MG PO TABS: 20.0000 mg | ORAL_TABLET | Freq: Every day | ORAL | Status: DC

## 2023-07-19 MED ORDER — REVEFENACIN 175 MCG/3ML IN SOLN
175.0000 ug | Freq: Every day | RESPIRATORY_TRACT | Status: DC
  Administered 2023-07-19 – 2023-07-21 (×3): 175 ug via RESPIRATORY_TRACT
  Filled 2023-07-19 (×3): qty 3

## 2023-07-19 MED ORDER — ARFORMOTEROL TARTRATE 15 MCG/2ML IN NEBU
15.0000 ug | INHALATION_SOLUTION | Freq: Two times a day (BID) | RESPIRATORY_TRACT | Status: DC
  Administered 2023-07-19 – 2023-07-21 (×4): 15 ug via RESPIRATORY_TRACT
  Filled 2023-07-19 (×4): qty 2

## 2023-07-19 MED ORDER — SODIUM CHLORIDE 0.9 % IV SOLN
2.0000 g | Freq: Once | INTRAVENOUS | Status: AC
  Administered 2023-07-19: 2 g via INTRAVENOUS
  Filled 2023-07-19: qty 12.5

## 2023-07-19 MED ORDER — SODIUM CHLORIDE 0.9 % IV SOLN: 1.0000 g | INTRAVENOUS | Status: DC

## 2023-07-19 MED ORDER — BUDESONIDE 0.25 MG/2ML IN SUSP
0.2500 mg | Freq: Two times a day (BID) | RESPIRATORY_TRACT | Status: DC
  Administered 2023-07-19 – 2023-07-21 (×4): 0.25 mg via RESPIRATORY_TRACT
  Filled 2023-07-19 (×4): qty 2

## 2023-07-19 MED ORDER — GUAIFENESIN 100 MG/5ML PO LIQD: 5.0000 mL | Freq: Four times a day (QID) | ORAL | Status: DC | PRN

## 2023-07-19 MED ORDER — OXYCODONE-ACETAMINOPHEN 5-325 MG PO TABS: 1.0000 | ORAL_TABLET | Freq: Three times a day (TID) | ORAL | Status: DC | PRN

## 2023-07-19 MED ORDER — SERTRALINE HCL 100 MG PO TABS
200.0000 mg | ORAL_TABLET | Freq: Every day | ORAL | Status: DC
  Administered 2023-07-19 – 2023-07-20 (×2): 200 mg via ORAL
  Filled 2023-07-19 (×2): qty 2

## 2023-07-19 MED ORDER — IPRATROPIUM-ALBUTEROL 0.5-2.5 (3) MG/3ML IN SOLN: 3.0000 mL | Freq: Four times a day (QID) | RESPIRATORY_TRACT | Status: DC

## 2023-07-19 NOTE — ED Provider Notes (Signed)
[place] EMERGENCY DEPARTMENT AT [facility] Provider Note   CSN: [id number] Arrival date & time: 07/18/23  2310     History  Chief Complaint  Patient presents with   Respiratory Distress    Tracheostomy    [name] is a 52 y.o. female.  Patient presents to the emergency department for evaluation of difficulty breathing.  Patient reports that she started having severe shortness of breath tonight.  She comes to the emergency department by ambulance.  Patient has been coughing up significant amounts of clear sputum.  She has had a previous trach.  She is maintained on oxygen by trach mask at home.  Patient complaining of chest pain.       Home Medications Prior to Admission medications   Medication Sig Start Date End Date Taking? Authorizing Provider  acetaminophen (TYLENOL) 500 MG tablet Take 1,000 mg by mouth 2 (two) times daily as needed for moderate pain, fever or headache.   Yes [provider]  albuterol (PROVENTIL) (2.5 MG/3ML) 0.083% nebulizer solution Take 3 mLs (2.5 mg total) by nebulization every 6 (six) hours as needed for wheezing or shortness of breath. 12/26/22  Yes Newlin, Odette Horns, MD  albuterol (VENTOLIN HFA) 108 (90 Base) MCG/ACT inhaler INHALE 2 PUFFS BY MOUTH EVERY 6 HOURS AS NEEDED FOR WHEEZING FOR SHORTNESS OF BREATH Patient taking differently: Inhale 2 puffs into the lungs every 6 (six) hours as needed for shortness of breath or wheezing. 02/03/23  Yes Hoy Register, MD  amitriptyline (ELAVIL) 10 MG tablet Take 1 tablet (10 mg total) by mouth at bedtime. 06/16/23  Yes Storm Frisk, MD  amLODipine (NORVASC) 10 MG tablet Take 10 mg by mouth daily. 07/08/23  Yes [provider]  Blood Glucose Monitoring Suppl (ONETOUCH VERIO FLEX SYSTEM) w/Device KIT Use to check glucose levels daily 06/05/23  Yes Newlin, Enobong, MD  buPROPion (WELLBUTRIN SR) 150 MG 12 hr tablet Take 1 tablet (150 mg total) by mouth 2 (two)  times daily. 07/06/23  Yes Hoy Register, MD  cetirizine (ZYRTEC) 10 MG tablet TAKE 1 TABLET BY MOUTH AT BEDTIME 12/10/21  Yes Charlott Holler, MD  Continuous Glucose Receiver (FREESTYLE LIBRE 2 READER) DEVI USE TO CHECK BLOOD SUGAR THREE TIMES DAILY. 06/01/23  Yes Hoy Register, MD  furosemide (LASIX) 40 MG tablet Take 1 tablet (40 mg total) by mouth daily as needed for fluid or edema. 03/12/23  Yes Almon Hercules, MD  gabapentin (NEURONTIN) 600 MG tablet Take 1 tablet (600 mg total) by mouth 3 (three) times daily. 07/06/23  Yes Hoy Register, MD  glucose blood (ONETOUCH VERIO) test strip Use to check glucose levels daily; E11.9 01/09/23  Yes Hoy Register, MD  hydrOXYzine (ATARAX) 25 MG tablet Take 1 tablet (25 mg total) by mouth 3 (three) times daily as needed for anxiety. 07/02/23  Yes Peterson Ao, MD  insulin degludec (TRESIBA FLEXTOUCH) 200 UNIT/ML FlexTouch Pen Inject 20 Units into the skin daily. 07/02/23  Yes Peterson Ao, MD  insulin lispro (HUMALOG KWIKPEN) 100 UNIT/ML KwikPen Inject 10 Units into the skin 3 (three) times daily. Patient taking differently: Inject 10 Units into the skin with breakfast, with lunch, and with evening meal. 01/15/23  Yes Newlin, Enobong, MD  lisinopril (ZESTRIL) 20 MG tablet Take 1 tablet (20 mg total) by mouth daily. 07/06/23  Yes Newlin, Odette Horns, MD  lisinopril (ZESTRIL) 40 MG tablet Take 40 mg by mouth daily. 04/16/23  Yes [provider]  methocarbamol (ROBAXIN) 750 MG  tablet Take 750 mg by mouth 3 (three) times daily as needed. 04/24/23  Yes [provider]  Misc. Devices MISC Shower chair 04/03/21  Yes Hoy Register, MD  Misc. Devices MISC Nebulizer device. Diagnosis - Asthma 04/06/23  Yes Newlin, Odette Horns, MD  mometasone-formoterol (DULERA) 200-5 MCG/ACT AERO Inhale 2 puffs into the lungs in the morning and at bedtime. 01/15/23  Yes Newlin, Odette Horns, MD  montelukast (SINGULAIR) 10 MG tablet Take 1 tablet (10 mg total) by mouth at  bedtime. 01/15/23  Yes Newlin, Odette Horns, MD  ondansetron (ZOFRAN-ODT) 4 MG disintegrating tablet Take 1 tablet (4 mg total) by mouth every 8 (eight) hours as needed for nausea or vomiting. Please take zofran 30 mins before planned meal and allow to set in before attempting to eat. 07/02/23  Yes Peterson Ao, MD  OneTouch Delica Lancets 33G MISC Use to check glucose levels daily; E11.9 01/09/23  Yes Hoy Register, MD  oxyCODONE-acetaminophen (PERCOCET) 10-325 MG tablet Take 1 tablet by mouth 4 (four) times daily as needed for pain. 11/22/21  Yes [provider]  pantoprazole (PROTONIX) 40 MG tablet Take 1 tablet (40 mg total) by mouth 2 (two) times daily. 07/06/23  Yes Newlin, Odette Horns, MD  pregabalin (LYRICA) 50 MG capsule TAKE 1 CAPSULE BY MOUTH THREE TIMES DAILY IN  THE  MORNING,  AT  NOON,  AND  AT  BEDTIME Patient taking differently: Take 50 mg by mouth 3 (three) times daily. 04/27/23  Yes London Sheer, MD  promethazine (PHENERGAN) 25 MG suppository Place 1 suppository (25 mg total) rectally every 8 (eight) hours as needed for nausea or vomiting. 07/06/23  Yes Hoy Register, MD  rosuvastatin (CRESTOR) 20 MG tablet Take 1 tablet by mouth once daily 04/27/23  Yes Newlin, Enobong, MD  sertraline (ZOLOFT) 100 MG tablet Take 2 tablets (200 mg total) by mouth at bedtime. 04/06/23  Yes Hoy Register, MD  spironolactone (ALDACTONE) 25 MG tablet Take 25 mg by mouth daily. 07/18/23  Yes [provider]  tiZANidine (ZANAFLEX) 4 MG tablet Take 1 tablet (4 mg total) by mouth every 8 (eight) hours as needed for muscle spasms. 07/06/23  Yes Newlin, Odette Horns, MD  Continuous Glucose Sensor (FREESTYLE LIBRE 2 SENSOR) MISC USE TO CHECK BLOOD GLUCOSE THREE TIMES DAILY.  CHANGE SENSOR ONCE EVERY 14 DAYS. 06/10/23   Hoy Register, MD  Insulin Pen Needle 31G X 8 MM MISC Use to inject [place] and Humalog. Pt may use 4 pen needles daily. 11/07/22   Hoy Register, MD  fluticasone-salmeterol (ADVAIR HFA)  230-21 MCG/ACT inhaler Inhale 2 puffs into the lungs 2 (two) times daily. 10/15/17 10/15/17  Hoy Register, MD      Allergies    Reglan [metoclopramide]    Review of Systems   Review of Systems  Physical Exam Updated Vital Signs BP 133/77   Pulse (!) 115   Temp 98.5 F (36.9 C) (Oral)   Resp 20   Ht 5\' 1"  (1.549 m)   Wt 110.2 kg   LMP  (LMP Unknown)   SpO2 100%   BMI 45.91 kg/m  Physical Exam Vitals and nursing note reviewed.  Constitutional:      General: She is not in acute distress.    Appearance: She is well-developed.  HENT:     Head: Normocephalic and atraumatic.     Mouth/Throat:     Mouth: Mucous membranes are moist.  Eyes:     General: Vision grossly intact. Gaze aligned appropriately.     Extraocular  Movements: Extraocular movements intact.     Conjunctiva/sclera: Conjunctivae normal.  Cardiovascular:     Rate and Rhythm: Regular rhythm. Tachycardia present.     Pulses: Normal pulses.     Heart sounds: Normal heart sounds, S1 normal and S2 normal. No murmur heard.    No friction rub. No gallop.  Pulmonary:     Effort: Tachypnea, accessory muscle usage and respiratory distress present.     Breath sounds: Rhonchi and rales present.  Abdominal:     General: Bowel sounds are normal.     Palpations: Abdomen is soft.     Tenderness: There is no abdominal tenderness. There is no guarding or rebound.     Hernia: No hernia is present.  Musculoskeletal:        General: No swelling.     Cervical back: Full passive range of motion without pain, normal range of motion and neck supple. No spinous process tenderness or muscular tenderness. Normal range of motion.     Right lower leg: No edema.     Left lower leg: No edema.  Skin:    General: Skin is warm and dry.     Capillary Refill: Capillary refill takes less than 2 seconds.     Findings: No ecchymosis, erythema, rash or wound.  Neurological:     General: No focal deficit present.     Mental Status: She is  alert and oriented to person, place, and time.     GCS: GCS eye subscore is 4. GCS verbal subscore is 5. GCS motor subscore is 6.     Cranial Nerves: Cranial nerves 2-12 are intact.     Sensory: Sensation is intact.     Motor: Motor function is intact.     Coordination: Coordination is intact.  Psychiatric:        Attention and Perception: Attention normal.        Mood and Affect: Mood normal.        Speech: Speech normal.        Behavior: Behavior normal.     ED Results / Procedures / Treatments   Labs (all labs ordered are listed, but only abnormal results are displayed) Labs Reviewed  COMPREHENSIVE METABOLIC PANEL - Abnormal; Notable for the following components:      Result Value   CO2 20 (*)    Glucose, Bld 267 (*)    Creatinine, Ser 1.88 (*)    Total Protein 8.7 (*)    GFR, Estimated 32 (*)    Anion gap 18 (*)    All other components within normal limits  CBC WITH DIFFERENTIAL/PLATELET - Abnormal; Notable for the following components:   WBC 14.9 (*)    Platelets 432 (*)    Lymphs Abs 6.9 (*)    All other components within normal limits  I-STAT CG4 LACTIC ACID, ED - Abnormal; Notable for the following components:   Lactic Acid, Venous 3.7 (*)    All other components within normal limits  I-STAT CHEM 8, ED - Abnormal; Notable for the following components:   Creatinine, Ser 2.00 (*)    Glucose, Bld 265 (*)    All other components within normal limits  I-STAT CG4 LACTIC ACID, ED - Abnormal; Notable for the following components:   Lactic Acid, Venous 2.5 (*)    All other components within normal limits  RESP PANEL BY RT-PCR (RSV, FLU A&B, COVID)  [id number]  CULTURE, BLOOD (ROUTINE X 2)  CULTURE, BLOOD (ROUTINE X 2)  EXPECTORATED SPUTUM  ASSESSMENT W GRAM STAIN, RFLX TO RESP C  PROTIME-INR  APTT  BRAIN NATRIURETIC PEPTIDE  TROPONIN I (HIGH SENSITIVITY)  TROPONIN I (HIGH SENSITIVITY)    EKG None ED ECG REPORT   Date: 07/19/2023  Rate: 144  Rhythm: sinus  tachycardia  QRS Axis: normal  Intervals: normal  ST/T Wave abnormalities: nonspecific ST/T changes  Conduction Disutrbances:none  Narrative Interpretation:   Old EKG Reviewed: changes noted  I have personally reviewed the EKG tracing and agree with the computerized printout as noted.   Radiology DG Chest Port 1 View  Result Date: 07/18/2023 CLINICAL DATA:  Questionable sepsis EXAM: PORTABLE CHEST 1 VIEW COMPARISON:  Chest x-ray 07/17/2023 FINDINGS: Tracheostomy is unchanged in position. Lung volumes are low likely accentuating central pulmonary vascularity. The heart is mildly enlarged. There is no lung consolidation, pleural effusion or pneumothorax. No acute fractures are seen. IMPRESSION: Low lung volumes likely accentuating central pulmonary vascularity. No lung consolidation. Electronically Signed   By: Darliss Cheney M.D.   On: 07/18/2023 23:22   CT Angio Chest Pulmonary Embolism (PE) W or WO Contrast  Result Date: 07/17/2023 CLINICAL DATA:  High probability for PE EXAM: CT ANGIOGRAPHY CHEST WITH CONTRAST TECHNIQUE: Multidetector CT imaging of the chest was performed using the standard protocol during bolus administration of intravenous contrast. Multiplanar CT image reconstructions and MIPs were obtained to evaluate the vascular anatomy. RADIATION DOSE REDUCTION: This exam was performed according to the departmental dose-optimization program which includes automated exposure control, adjustment of the mA and/or kV according to patient size and/or use of iterative reconstruction technique. CONTRAST:  75mL OMNIPAQUE IOHEXOL 350 MG/ML SOLN COMPARISON:  CT angiogram chest abdomen and pelvis 03/10/2023 FINDINGS: Cardiovascular: Satisfactory opacification of the pulmonary arteries to the segmental level. No evidence of pulmonary embolism. Normal heart size. No pericardial effusion. Mediastinum/Nodes: No enlarged mediastinal, hilar, or axillary lymph nodes. Thyroid gland, trachea, and esophagus  demonstrate no significant abnormality. Tracheostomy in place. Lungs/Pleura: Lungs are clear. No pleural effusion or pneumothorax. Upper Abdomen: No acute abnormality. Musculoskeletal: No chest wall abnormality. No acute or significant osseous findings. Review of the MIP images confirms the above findings. IMPRESSION: 1. No evidence for pulmonary embolism or other acute intrathoracic process. 2. Tracheostomy in place. Electronically Signed   By: Darliss Cheney M.D.   On: 07/17/2023 20:55   CT Head Wo Contrast  Result Date: 07/17/2023 CLINICAL DATA:  Head trauma, moderate-severe; Neck trauma, dangerous injury mechanism (Age 24-64y) EXAM: CT HEAD WITHOUT CONTRAST CT CERVICAL SPINE WITHOUT CONTRAST TECHNIQUE: Multidetector CT imaging of the head and cervical spine was performed following the standard protocol without intravenous contrast. Multiplanar CT image reconstructions of the cervical spine were also generated. RADIATION DOSE REDUCTION: This exam was performed according to the departmental dose-optimization program which includes automated exposure control, adjustment of the mA and/or kV according to patient size and/or use of iterative reconstruction technique. COMPARISON:  None Available. FINDINGS: CT HEAD FINDINGS Brain: No evidence of acute infarction, hemorrhage, hydrocephalus, extra-axial collection or mass lesion/mass effect. Partially empty sella. Vascular: No hyperdense vessel. Skull: No acute fracture. Sinuses/Orbits: Clear sinuses.  No acute orbital findings. Other: No mastoid effusions. CT CERVICAL SPINE FINDINGS Alignment: Straightening.  No substantial sagittal subluxation. Skull base and vertebrae: No acute fracture. No primary bone lesion or focal pathologic process. Soft tissues and spinal canal: No prevertebral fluid or swelling. No visible canal hematoma. Disc levels:  No significant bony degenerative change. Upper chest: Visualized lung apices are clear. IMPRESSION: No evidence of acute  abnormality intracranially or in the cervical spine. Electronically Signed   By: Feliberto Harts M.D.   On: 07/17/2023 17:11   CT Cervical Spine Wo Contrast  Result Date: 07/17/2023 CLINICAL DATA:  Head trauma, moderate-severe; Neck trauma, dangerous injury mechanism (Age 78-64y) EXAM: CT HEAD WITHOUT CONTRAST CT CERVICAL SPINE WITHOUT CONTRAST TECHNIQUE: Multidetector CT imaging of the head and cervical spine was performed following the standard protocol without intravenous contrast. Multiplanar CT image reconstructions of the cervical spine were also generated. RADIATION DOSE REDUCTION: This exam was performed according to the departmental dose-optimization program which includes automated exposure control, adjustment of the mA and/or kV according to patient size and/or use of iterative reconstruction technique. COMPARISON:  None Available. FINDINGS: CT HEAD FINDINGS Brain: No evidence of acute infarction, hemorrhage, hydrocephalus, extra-axial collection or mass lesion/mass effect. Partially empty sella. Vascular: No hyperdense vessel. Skull: No acute fracture. Sinuses/Orbits: Clear sinuses.  No acute orbital findings. Other: No mastoid effusions. CT CERVICAL SPINE FINDINGS Alignment: Straightening.  No substantial sagittal subluxation. Skull base and vertebrae: No acute fracture. No primary bone lesion or focal pathologic process. Soft tissues and spinal canal: No prevertebral fluid or swelling. No visible canal hematoma. Disc levels:  No significant bony degenerative change. Upper chest: Visualized lung apices are clear. IMPRESSION: No evidence of acute abnormality intracranially or in the cervical spine. Electronically Signed   By: Feliberto Harts M.D.   On: 07/17/2023 17:11   DG Chest 2 View  Result Date: 07/17/2023 CLINICAL DATA:  Shortness of breath. EXAM: CHEST - 2 VIEW COMPARISON:  01/28/2023. FINDINGS: Low lung volume. Again seen is elevated right hemidiaphragm. Bilateral lung fields are  clear. Bilateral costophrenic angles are clear. Normal cardio-mediastinal silhouette. No acute osseous abnormalities. The soft tissues are within normal limits. Tracheostomy tube seen with its tip approximately 6.3 cm above the carina. There are surgical clips in the right upper quadrant, typical of a previous cholecystectomy. IMPRESSION: *No active cardiopulmonary disease. Electronically Signed   By: Jules Schick M.D.   On: 07/17/2023 16:55    Procedures Procedures    Medications Ordered in ED Medications  morphine (PF) 4 MG/ML injection 4 mg (4 mg Intravenous Given 07/19/23 0032)  ondansetron (ZOFRAN) injection 4 mg (4 mg Intravenous Given 07/19/23 0029)  ceFEPIme (MAXIPIME) 2 g in sodium chloride 0.9 % 100 mL IVPB (0 g Intravenous Stopped 07/19/23 0246)  vancomycin (VANCOREADY) IVPB 2000 mg/400 mL (0 mg Intravenous Stopped 07/19/23 0418)  morphine (PF) 4 MG/ML injection 4 mg (4 mg Intravenous Given 07/19/23 1191)    ED Course/ Medical Decision Making/ A&P                                 Medical Decision Making Amount and/or Complexity of Data Reviewed Labs: ordered. Radiology: ordered. ECG/medicine tests: ordered.  Risk Prescription drug management.   Presents to the emergency department in respiratory distress.  Patient with prior tracheostomy, not vented at home.  She is normally on 6 L by trach collar.  Patient reports sudden onset of severe shortness of breath.  EMS report copious trach secretions during transport.  Patient still in significant distress at arrival.  Patient normally has an uncuffed #4 trach.  This was changed out to a #6 cuffed trach.  There was some slight trauma but it was not very difficult to get the trach in place.  Patient was then placed on a ventilator and significantly improved her distress.  Chest x-ray without obvious abnormalities.  She is complaining of chest pain.  Patient was seen within the last 24 hours with chest pain and had a workup.   This included troponins and PE study that was negative.  Patient with mild AKI.  Initial lactic acid elevated at 3.7.  Broad-spectrum antibiotics administered for lung coverage.  Blood and urine cultures pending.  CRITICAL CARE Performed by: Gilda Crease   Total critical care time: 30 minutes  Critical care time was exclusive of separately billable procedures and treating other patients.  Critical care was necessary to treat or prevent imminent or life-threatening deterioration.  Critical care was time spent personally by me on the following activities: development of treatment plan with patient and/or surrogate as well as nursing, discussions with consultants, evaluation of patient's response to treatment, examination of patient, obtaining history from patient or surrogate, ordering and performing treatments and interventions, ordering and review of laboratory studies, ordering and review of radiographic studies, pulse oximetry and re-evaluation of patient's condition.         Final Clinical Impression(s) / ED Diagnoses Final diagnoses:  Acute respiratory failure with hypoxia [facility])    Rx / DC Orders ED Discharge Orders     None         Raevin Wierenga, Canary Brim, MD 07/19/23 [phone]

## 2023-07-19 NOTE — Consult Note (Signed)
NAME:  [name], MRN:  [id number], DOB:  April 19, 1971, LOS: 0 ADMISSION DATE:  07/18/2023, CONSULTATION DATE:  07/19/23 REFERRING MD:  Danise Edge, CHIEF COMPLAINT:  SOB   History of Present Illness:  52 year old woman w/ hx of OSA, tracheal stenosis, asthma, trach-dependent presenting with increasing secretions and presyncope required trach exchange for cuffed trach on vent briefly.  PCCM consulted to assist with management and getting back to cuffless trach.  She feels better just uncomfortable with cuffed tracheostomy size.  Imaging/labs/HP reviewed.  Pertinent  Medical History   Past Medical History:  Diagnosis Date   Abnormal UGI series    Acute pain of right shoulder due to trauma 10/05/2018   Acute respiratory failure with hypoxia and hypercapnia (HCC) 10/23/2018   Allergy    Anemia    Anxiety    Arthritis    Asthma    Chest pain of uncertain etiology 03/22/2019   Chronic back pain    Chronic chest pain    resolved, no problems since 2019 per patient 07/27/19   Coffee ground vomiting 10/14/2021   COPD (chronic obstructive pulmonary disease) (HCC)    inhaler   Depression    DM (diabetes mellitus) (HCC)    INSULIN DEPENDENT - TYPE 1   Dyspnea    GERD (gastroesophageal reflux disease)    Headache(784.0)    History of hiatal hernia    Hyperlipidemia    no med, diet controlled   Hypertension    Hypokalemia    Klebsiella cystitis 03/14/2017   Last Assessment & Plan:   Formatting of this note might be different from the original.  - On Zosyn  - Urine culture: Klebsiella pneumoniae   Neuromuscular disorder (HCC)    Osteoarthritis    Oxygen deficiency    Pneumonia    Respiratory disease 05/2017   uses inhalers, neb tx prn, no oxygen   Rotator cuff tear 07/28/2019   Sleep apnea    does not use CPAP due to trach, uses 2L supplement oxygen at night   Status post tracheostomy (HCC) 03/14/2017   Last Assessment & Plan:  Formatting of this note might be  different from the original. - VERY CRITICAL AIRWAY WITH EXTREME PRECUATION - On tach - ENT following along (please see above for current trach management plan)   Tracheostomy in place [facility]) 02/2017     Significant Hospital Events: Including procedures, antibiotic start and stop dates in addition to other pertinent events   10/26 admit 10/27 PCCM consult  Interim History / Subjective:  Consult  Objective   Blood pressure 109/66, pulse (!) 125, temperature 98.2 F (36.8 C), temperature source Oral, resp. rate 20, height 5\' 1"  (1.549 m), weight 110.2 kg, SpO2 100%.    Vent Mode: PRVC FiO2 (%):  [28 %-40 %] 28 % Set Rate:  [14 bmp] 14 bmp Vt Set:  [520 mL] 520 mL PEEP:  [5 cmH20] 5 cmH20   Intake/Output Summary (Last 24 hours) at 07/19/2023 0859 Last data filed at 07/19/2023 0756 Gross per 24 hour  Intake 1000 ml  Output --  Net 1000 ml   Filed Weights   07/19/23 0205  Weight: 110.2 kg    Examination: General: uncomfortable appearing HENT: trach in place minimal secretions Lungs: diminished due to body habitus, I do not hear wheezing Cardiovascular: tachy, ext warm Abdomen: soft, +BS Extremities: no edema Neuro: moves to command Skin: no rashes  Mild AKI noted WBC bump with steroids Lactate was mildly up BNP/trop neg  Resolved Hospital Problem list   N/A  Assessment & Plan:  Asthma flare +/- CAP: baseline trach dependence related to tracheal stenosis.  Improving.  Agree with steroids and abx; improving, will ask RT to change trach back to cuffless.  Work on mobility.  If hypertonic nebs helping, okay to continue.  Will check usual urinary ag, Pct, and  tracheal aspirate.  Switch nebs to LABA/LAMA/ICS.  Available PRN.  Best Practice (right click and "Reselect all SmartList Selections" daily)  Per primary  Labs   CBC: Recent Labs  Lab 07/17/23 1520 07/17/23 1530 07/18/23 2316 07/18/23 2317  WBC 8.6  --  14.9*  --   NEUTROABS 3.8  --  7.1  --   HGB 11.8*  12.6 12.4 13.9  HCT 37.1 37.0 38.5 41.0  MCV 86.5  --  85.6  --   PLT 387  --  432*  --     Basic Metabolic Panel: Recent Labs  Lab 07/17/23 1520 07/17/23 1530 07/18/23 2316 07/18/23 2317  NA 137 138 139 142  K 5.2* 4.7 3.9 3.9  CL 105  --  101 104  CO2 21*  --  20*  --   GLUCOSE 136*  --  267* 265*  BUN 24*  --  18 20  CREATININE 1.20*  --  1.88* 2.00*  CALCIUM 9.2  --  10.1  --    GFR: Estimated Creatinine Clearance: 37.4 mL/min (A) (by C-G formula based on SCr of 2 mg/dL (H)). Recent Labs  Lab 07/17/23 1520 07/18/23 2316 07/18/23 2318 07/19/23 0109  WBC 8.6 14.9*  --   --   LATICACIDVEN  --   --  3.7* 2.5*    Liver Function Tests: Recent Labs  Lab 07/17/23 1520 07/18/23 2316  AST 23 23  ALT 19 23  ALKPHOS 92 114  BILITOT 0.9 0.7  PROT 7.6 8.7*  ALBUMIN 3.8 4.2   No results for input(s): "LIPASE", "AMYLASE" in the last 168 hours. No results for input(s): "AMMONIA" in the last 168 hours.  ABG    Component Value Date/Time   PHART 7.404 08/27/2020 0029   PCO2ART 28.8 (L) 08/27/2020 0029   PO2ART 95 08/27/2020 0029   HCO3 23.0 07/17/2023 1530   TCO2 23 07/18/2023 2317   ACIDBASEDEF 6.0 (H) 08/27/2020 0029   O2SAT 61 07/17/2023 1530     Coagulation Profile: Recent Labs  Lab 07/18/23 2316  INR 1.0    Cardiac Enzymes: No results for input(s): "CKTOTAL", "CKMB", "CKMBINDEX", "TROPONINI" in the last 168 hours.  HbA1C: HbA1c, POC (controlled diabetic range)  Date/Time Value Ref Range Status  01/15/2023 11:35 AM 11.2 (A) 0.0 - 7.0 % Final  10/09/2022 09:55 AM 8.1 (A) 0.0 - 7.0 % Final   Hgb A1c MFr Bld  Date/Time Value Ref Range Status  06/28/2023 10:35 AM 8.9 (H) 4.8 - 5.6 % Final    Comment:    (NOTE) Pre diabetes:          5.7%-6.4%  Diabetes:              >6.4%  Glycemic control for   <7.0% adults with diabetes   03/11/2023 12:40 AM 10.3 (H) 4.8 - 5.6 % Final    Comment:    (NOTE) Pre diabetes:          5.7%-6.4%  Diabetes:               >6.4%  Glycemic control for   <7.0% adults with diabetes  CBG: Recent Labs  Lab 07/17/23 1622 07/19/23 0822  GLUCAP 110* 192*    Review of Systems:    Positive Symptoms in bold:  Constitutional fevers, chills, weight loss, fatigue, anorexia, malaise  Eyes decreased vision, double vision, eye irritation  Ears, Nose, Mouth, Throat sore throat, trouble swallowing, sinus congestion  Cardiovascular chest pain, paroxysmal nocturnal dyspnea, lower ext edema, palpitations   Respiratory SOB, cough, DOE, hemoptysis, wheezing  Gastrointestinal nausea, vomiting, diarrhea  Genitourinary burning with urination, trouble urinating  Musculoskeletal joint aches, joint swelling, back pain  Integumentary  rashes, skin lesions  Neurological focal weakness, focal numbness, trouble speaking, headaches  Psychiatric depression, anxiety, confusion  Endocrine polyuria, polydipsia, cold intolerance, heat intolerance  Hematologic abnormal bruising, abnormal bleeding, unexplained nose bleeds  Allergic/Immunologic recurrent infections, hives, swollen lymph nodes     Past Medical History:  She,  has a past medical history of Abnormal UGI series, Acute pain of right shoulder due to trauma (10/05/2018), Acute respiratory failure with hypoxia and hypercapnia (HCC) (10/23/2018), Allergy, Anemia, Anxiety, Arthritis, Asthma, Chest pain of uncertain etiology (03/22/2019), Chronic back pain, Chronic chest pain, Coffee ground vomiting (10/14/2021), COPD (chronic obstructive pulmonary disease) (HCC), Depression, DM (diabetes mellitus) (HCC), Dyspnea, GERD (gastroesophageal reflux disease), Headache(784.0), History of hiatal hernia, Hyperlipidemia, Hypertension, Hypokalemia, Klebsiella cystitis (03/14/2017), Neuromuscular disorder (HCC), Osteoarthritis, Oxygen deficiency, Pneumonia, Respiratory disease (05/2017), Rotator cuff tear (07/28/2019), Sleep apnea, Status post tracheostomy (HCC) (03/14/2017), and  Tracheostomy in place [facility]) (02/2017).   Surgical History:   Past Surgical History:  Procedure Laterality Date   ABDOMINAL HYSTERECTOMY  2009   APPENDECTOMY     BIOPSY  10/15/2021   Procedure: BIOPSY;  Surgeon: Tressia Danas, MD;  [facility] ENDOSCOPY;  Service: Gastroenterology;;   CESAREAN SECTION     x 3   CHOLECYSTECTOMY N/A 03/02/2014   Procedure: LAPAROSCOPIC CHOLECYSTECTOMY;  Surgeon: Clovis Pu. Cornett, MD;  Location: [facility] OR;  Service: General;  Laterality: N/A;   ESOPHAGOGASTRODUODENOSCOPY (EGD) WITH PROPOFOL N/A 10/15/2021   Procedure: ESOPHAGOGASTRODUODENOSCOPY (EGD) WITH PROPOFOL;  Surgeon: Tressia Danas, MD;  Location: [facility] ENDOSCOPY;  Service: Gastroenterology;  Laterality: N/A;   HERNIA REPAIR     PANENDOSCOPY N/A 03/04/2017   Procedure: PANENDOSCOPY WITH POSSIBLE FOREIGN BODY REMOVAL;  Surgeon: Flo Shanks, MD;  Location: [facility] OR;  Service: ENT;  Laterality: N/A;   ROTATOR CUFF REPAIR Left    ROTATOR CUFF REPAIR Right 07/27/2019   SHOULDER ARTHROSCOPY WITH SUBACROMIAL DECOMPRESSION, ROTATOR CUFF REPAIR AND BICEP TENDON REPAIR Right 07/28/2019   Procedure: RIGHT SHOULDER ARTHROSCOP, MINI OPEN ROTATOR CUFF TEAR REPAIR,  BICEPS TENODESIS, DISTAL CLAVICLE EXCISION;  Surgeon: Cammy Copa, MD;  Location: [facility] OR;  Service: Orthopedics;  Laterality: Right;   SPINE SURGERY     TOOTH EXTRACTION N/A 02/22/2021   Procedure: DENTAL RESTORATION/EXTRACTIONS;  Surgeon: Ocie Doyne, DMD;  Location: [facility] OR;  Service: Oral Surgery;  Laterality: N/A;   TRACHEOSTOMY  02/2017   TUBAL LIGATION     VESICOVAGINAL FISTULA CLOSURE W/ TAH  2009     Social History:   reports that she quit smoking about 7 years ago. Her smoking use included cigarettes. She started smoking about 32 years ago. She has a 12.5 pack-year smoking history. She has never used smokeless tobacco. She reports current alcohol use of about 1.0 standard drink of alcohol per week. She reports that she does not use  drugs.   Family History:  Her family history includes Arthritis in her mother; Asthma in her brother, brother, daughter, grandson, sister,  sister, and son; Diabetes in her brother, mother, and sister; Heart attack in her mother; Hypertension in her mother and sister; Seizures in her brother; Stroke in her father and mother. There is no history of Colon cancer, Rectal cancer, Stomach cancer, Esophageal cancer, Ovarian cancer, or Pancreatic cancer.   Allergies Allergies  Allergen Reactions   Reglan [Metoclopramide] Other (See Comments)    Panic attack     Home Medications  Prior to Admission medications   Medication Sig Start Date End Date Taking? Authorizing Provider  acetaminophen (TYLENOL) 500 MG tablet Take 1,000 mg by mouth 2 (two) times daily as needed for moderate pain, fever or headache.   Yes [provider]  albuterol (PROVENTIL) (2.5 MG/3ML) 0.083% nebulizer solution Take 3 mLs (2.5 mg total) by nebulization every 6 (six) hours as needed for wheezing or shortness of breath. 12/26/22  Yes Newlin, Odette Horns, MD  albuterol (VENTOLIN HFA) 108 (90 Base) MCG/ACT inhaler INHALE 2 PUFFS BY MOUTH EVERY 6 HOURS AS NEEDED FOR WHEEZING FOR SHORTNESS OF BREATH Patient taking differently: Inhale 2 puffs into the lungs every 6 (six) hours as needed for shortness of breath or wheezing. 02/03/23  Yes Hoy Register, MD  amitriptyline (ELAVIL) 10 MG tablet Take 1 tablet (10 mg total) by mouth at bedtime. 06/16/23  Yes Storm Frisk, MD  amLODipine (NORVASC) 10 MG tablet Take 10 mg by mouth daily. 07/08/23  Yes [provider]  Blood Glucose Monitoring Suppl (ONETOUCH VERIO FLEX SYSTEM) w/Device KIT Use to check glucose levels daily 06/05/23  Yes Newlin, Enobong, MD  buPROPion (WELLBUTRIN SR) 150 MG 12 hr tablet Take 1 tablet (150 mg total) by mouth 2 (two) times daily. 07/06/23  Yes Hoy Register, MD  cetirizine (ZYRTEC) 10 MG tablet TAKE 1 TABLET BY MOUTH AT BEDTIME 12/10/21  Yes  Charlott Holler, MD  Continuous Glucose Receiver (FREESTYLE LIBRE 2 READER) DEVI USE TO CHECK BLOOD SUGAR THREE TIMES DAILY. 06/01/23  Yes Hoy Register, MD  furosemide (LASIX) 40 MG tablet Take 1 tablet (40 mg total) by mouth daily as needed for fluid or edema. 03/12/23  Yes Almon Hercules, MD  gabapentin (NEURONTIN) 600 MG tablet Take 1 tablet (600 mg total) by mouth 3 (three) times daily. 07/06/23  Yes Hoy Register, MD  glucose blood (ONETOUCH VERIO) test strip Use to check glucose levels daily; E11.9 01/09/23  Yes Hoy Register, MD  hydrOXYzine (ATARAX) 25 MG tablet Take 1 tablet (25 mg total) by mouth 3 (three) times daily as needed for anxiety. 07/02/23  Yes Peterson Ao, MD  insulin degludec (TRESIBA FLEXTOUCH) 200 UNIT/ML FlexTouch Pen Inject 20 Units into the skin daily. 07/02/23  Yes Peterson Ao, MD  insulin lispro (HUMALOG KWIKPEN) 100 UNIT/ML KwikPen Inject 10 Units into the skin 3 (three) times daily. Patient taking differently: Inject 10 Units into the skin with breakfast, with lunch, and with evening meal. 01/15/23  Yes Newlin, Enobong, MD  lisinopril (ZESTRIL) 20 MG tablet Take 1 tablet (20 mg total) by mouth daily. 07/06/23  Yes Newlin, Odette Horns, MD  lisinopril (ZESTRIL) 40 MG tablet Take 40 mg by mouth daily. 04/16/23  Yes [provider]  methocarbamol (ROBAXIN) 750 MG tablet Take 750 mg by mouth 3 (three) times daily as needed. 04/24/23  Yes [provider]  Misc. Devices MISC Shower chair 04/03/21  Yes Hoy Register, MD  Misc. Devices MISC Nebulizer device. Diagnosis - Asthma 04/06/23  Yes Hoy Register, MD  mometasone-formoterol (DULERA) 200-5 MCG/ACT AERO Inhale 2  puffs into the lungs in the morning and at bedtime. 01/15/23  Yes Newlin, Odette Horns, MD  montelukast (SINGULAIR) 10 MG tablet Take 1 tablet (10 mg total) by mouth at bedtime. 01/15/23  Yes Newlin, Odette Horns, MD  ondansetron (ZOFRAN-ODT) 4 MG disintegrating tablet Take 1 tablet (4 mg total) by mouth  every 8 (eight) hours as needed for nausea or vomiting. Please take zofran 30 mins before planned meal and allow to set in before attempting to eat. 07/02/23  Yes Peterson Ao, MD  OneTouch Delica Lancets 33G MISC Use to check glucose levels daily; E11.9 01/09/23  Yes Hoy Register, MD  oxyCODONE-acetaminophen (PERCOCET) 10-325 MG tablet Take 1 tablet by mouth 4 (four) times daily as needed for pain. 11/22/21  Yes [provider]  pantoprazole (PROTONIX) 40 MG tablet Take 1 tablet (40 mg total) by mouth 2 (two) times daily. 07/06/23  Yes Newlin, Odette Horns, MD  pregabalin (LYRICA) 50 MG capsule TAKE 1 CAPSULE BY MOUTH THREE TIMES DAILY IN  THE  MORNING,  AT  NOON,  AND  AT  BEDTIME Patient taking differently: Take 50 mg by mouth 3 (three) times daily. 04/27/23  Yes London Sheer, MD  promethazine (PHENERGAN) 25 MG suppository Place 1 suppository (25 mg total) rectally every 8 (eight) hours as needed for nausea or vomiting. 07/06/23  Yes Hoy Register, MD  rosuvastatin (CRESTOR) 20 MG tablet Take 1 tablet by mouth once daily 04/27/23  Yes Newlin, Enobong, MD  sertraline (ZOLOFT) 100 MG tablet Take 2 tablets (200 mg total) by mouth at bedtime. 04/06/23  Yes Hoy Register, MD  spironolactone (ALDACTONE) 25 MG tablet Take 25 mg by mouth daily. 07/18/23  Yes [provider]  tiZANidine (ZANAFLEX) 4 MG tablet Take 1 tablet (4 mg total) by mouth every 8 (eight) hours as needed for muscle spasms. 07/06/23  Yes Newlin, Odette Horns, MD  Continuous Glucose Sensor (FREESTYLE LIBRE 2 SENSOR) MISC USE TO CHECK BLOOD GLUCOSE THREE TIMES DAILY.  CHANGE SENSOR ONCE EVERY 14 DAYS. 06/10/23   Hoy Register, MD  Insulin Pen Needle 31G X 8 MM MISC Use to inject [place] and Humalog. Pt may use 4 pen needles daily. 11/07/22   Hoy Register, MD  fluticasone-salmeterol (ADVAIR HFA) 230-21 MCG/ACT inhaler Inhale 2 puffs into the lungs 2 (two) times daily. 10/15/17 10/15/17  Hoy Register, MD     Critical care  time: N/A

## 2023-07-19 NOTE — Procedures (Signed)
Tracheostomy Change Note  Patient Details:   Name: [name] DOB: [birth year] MRN: [id number]    Airway Documentation:     Evaluation  O2 sats: stable throughout Complications: No apparent complications Patient did tolerate procedure well. Bilateral Breath Sounds: Diminished, Expiratory wheezes  Trach changed per MD by RT x2 w/o complications. Positive color change, Pt VS stable.  Allen Norris 07/19/2023, 10:37 AM

## 2023-07-19 NOTE — Hospital Course (Addendum)
Principal Problem:   Asthma, chronic obstructive, with acute exacerbation (HCC) Active Problems:   OSA (obstructive sleep apnea)   Chronic pain syndrome   Tracheal stenosis   AKI (acute kidney injury) (HCC)   Tracheostomy dependent (HCC)   Diabetes (HCC)  Resolved Problems:   Syncope   Acute on chronic hypoxic respiratory failure (HCC)   Increased anion gap metabolic acidosis  Consults: -pccm  Procedures: -trach exchange  Follow-up items: -hh ot

## 2023-07-19 NOTE — Progress Notes (Signed)
Patient arrived at the unit from the ED, CHG bath given,vitals taken,CCMD notified, pt oriented to the unit

## 2023-07-19 NOTE — H&P (Addendum)
Date: 07/19/2023               Patient Name:  [name] MRN: [id number]  DOB: 1970/10/21 Age / Sex: 51 y.o., female   PCP: Hoy Register, MD         Medical Service: Internal Medicine Teaching Service         Attending Physician: Dr. Oswaldo Done, Marquita Palms, *      First Contact: Dr. Lovie Macadamia MD Pager [phone]    Second Contact: Dr. Marrianne Mood, MD Pager [phone]         After Hours (After 5p/  First Contact Pager: [phone]  weekends / holidays): Second Contact Pager: [phone]   SUBJECTIVE   Chief Complaint: Shortness of breath   History of Present Illness: [name] is a 52 year old female with a past medical history of HTN, HFpEF, chronic respiratory failure 3 L intermittent supplemental oxygen use, severe persistent asthma s/p trach dependent, chronic pain, depression, anxiety and diabetes who presented to the emergency department for shortness of breath that worsened last evening.   Patient reported worsening of shortness of breath the past few evenings requiring intermittent oxygen use that improves throughout the day. There shortness of breath worsened yesterday evening which prompted her to seek help. She also reported a cough that began a few days ago as well as increased tracheostomy secretions, a fever at home of 101, and chills. She reports needing to increase the amount of duonebs used throughout the day.   She reported chest pain that was evaluated in the emergency department on 10/25 where she was discharged home after a reassuring cardiac workup. She reported chest pain that worsens with inspiration and coughing.   Patient also reported dysuria, decreased urinary output, and suprapubic abdominal pain that started a few weeks ago. She denied increased urinary frequency.  She also has experienced 4 episodes of syncope in this past week. Per patient, 2 of the episodes were not proceeded by dizziness, coughing, and she did  not answer if she was hypoglycemic during these episodes (she reported a few instances with hypoglycemia). She reported having 2 episodes that happened after she was feeling dizzy. During our conversation, she was coughing and reported feeling like she was going to "pass out".   ROS: negative for decreased appetite, epigastric abdominal pain    Meds:  Spironolactone 25 mg Lisinopril 40 mg Amlodipine 10 mg Tizanidine 4 mg TID Gabapentin 600 TID  Bupropion 150 BID Pantoprazole 40 Atarax 25 Tresiba 20 units AM Amtriptyline 10 Rosuvastatin 20 mg  Sertraline 100 Furosemide 40 every day prn Lispro 10 TID Dulera 200-5 2 puffs BID Montelukast 10 mg Albuterol nebulizer Cetirizine 10 mg  Percocet 10-325 q4prn  Current Meds  Medication Sig   acetaminophen (TYLENOL) 500 MG tablet Take 1,000 mg by mouth 2 (two) times daily as needed for moderate pain, fever or headache.   albuterol (PROVENTIL) (2.5 MG/3ML) 0.083% nebulizer solution Take 3 mLs (2.5 mg total) by nebulization every 6 (six) hours as needed for wheezing or shortness of breath.   albuterol (VENTOLIN HFA) 108 (90 Base) MCG/ACT inhaler INHALE 2 PUFFS BY MOUTH EVERY 6 HOURS AS NEEDED FOR WHEEZING FOR SHORTNESS OF BREATH (Patient taking differently: Inhale 2 puffs into the lungs every 6 (six) hours as needed for shortness of breath or wheezing.)   amitriptyline (ELAVIL) 10 MG tablet Take 1 tablet (10 mg total) by mouth at bedtime.   amLODipine (NORVASC) 10 MG tablet Take 10 mg by  mouth daily.   Blood Glucose Monitoring Suppl (ONETOUCH VERIO FLEX SYSTEM) w/Device KIT Use to check glucose levels daily   buPROPion (WELLBUTRIN SR) 150 MG 12 hr tablet Take 1 tablet (150 mg total) by mouth 2 (two) times daily.   cetirizine (ZYRTEC) 10 MG tablet TAKE 1 TABLET BY MOUTH AT BEDTIME   Continuous Glucose Receiver (FREESTYLE LIBRE 2 READER) DEVI USE TO CHECK BLOOD SUGAR THREE TIMES DAILY.   furosemide (LASIX) 40 MG tablet Take 1 tablet (40 mg total)  by mouth daily as needed for fluid or edema.   gabapentin (NEURONTIN) 600 MG tablet Take 1 tablet (600 mg total) by mouth 3 (three) times daily.   glucose blood (ONETOUCH VERIO) test strip Use to check glucose levels daily; E11.9   hydrOXYzine (ATARAX) 25 MG tablet Take 1 tablet (25 mg total) by mouth 3 (three) times daily as needed for anxiety.   insulin degludec (TRESIBA FLEXTOUCH) 200 UNIT/ML FlexTouch Pen Inject 20 Units into the skin daily.   insulin lispro (HUMALOG KWIKPEN) 100 UNIT/ML KwikPen Inject 10 Units into the skin 3 (three) times daily. (Patient taking differently: Inject 10 Units into the skin with breakfast, with lunch, and with evening meal.)   lisinopril (ZESTRIL) 20 MG tablet Take 1 tablet (20 mg total) by mouth daily.   lisinopril (ZESTRIL) 40 MG tablet Take 40 mg by mouth daily.   methocarbamol (ROBAXIN) 750 MG tablet Take 750 mg by mouth 3 (three) times daily as needed.   Misc. Devices TEFL teacher. Devices MISC Nebulizer device. Diagnosis - Asthma   mometasone-formoterol (DULERA) 200-5 MCG/ACT AERO Inhale 2 puffs into the lungs in the morning and at bedtime.   montelukast (SINGULAIR) 10 MG tablet Take 1 tablet (10 mg total) by mouth at bedtime.   ondansetron (ZOFRAN-ODT) 4 MG disintegrating tablet Take 1 tablet (4 mg total) by mouth every 8 (eight) hours as needed for nausea or vomiting. Please take zofran 30 mins before planned meal and allow to set in before attempting to eat.   OneTouch Delica Lancets 33G MISC Use to check glucose levels daily; E11.9   oxyCODONE-acetaminophen (PERCOCET) 10-325 MG tablet Take 1 tablet by mouth 4 (four) times daily as needed for pain.   pantoprazole (PROTONIX) 40 MG tablet Take 1 tablet (40 mg total) by mouth 2 (two) times daily.   pregabalin (LYRICA) 50 MG capsule TAKE 1 CAPSULE BY MOUTH THREE TIMES DAILY IN  THE  MORNING,  AT  NOON,  AND  AT  BEDTIME (Patient taking differently: Take 50 mg by mouth 3 (three) times daily.)    promethazine (PHENERGAN) 25 MG suppository Place 1 suppository (25 mg total) rectally every 8 (eight) hours as needed for nausea or vomiting.   rosuvastatin (CRESTOR) 20 MG tablet Take 1 tablet by mouth once daily   sertraline (ZOLOFT) 100 MG tablet Take 2 tablets (200 mg total) by mouth at bedtime.   spironolactone (ALDACTONE) 25 MG tablet Take 25 mg by mouth daily.   tiZANidine (ZANAFLEX) 4 MG tablet Take 1 tablet (4 mg total) by mouth every 8 (eight) hours as needed for muscle spasms.    Past Medical History Hypertension Hyperlipidemia chronic diastolic heart failure OSA chronic respiratory failure on 3L supplemental  severe persistent asthma s/p trach dependent chronic pain Depression Anxiety diabetes   Past Surgical History:  Procedure Laterality Date   ABDOMINAL HYSTERECTOMY  2009   APPENDECTOMY     BIOPSY  10/15/2021   Procedure: BIOPSY;  Surgeon:  Tressia Danas, MD;  Location: [facility] ENDOSCOPY;  Service: Gastroenterology;;   CESAREAN SECTION     x 3   CHOLECYSTECTOMY N/A 03/02/2014   Procedure: LAPAROSCOPIC CHOLECYSTECTOMY;  Surgeon: Clovis Pu. Cornett, MD;  Location: [facility] OR;  Service: General;  Laterality: N/A;   ESOPHAGOGASTRODUODENOSCOPY (EGD) WITH PROPOFOL N/A 10/15/2021   Procedure: ESOPHAGOGASTRODUODENOSCOPY (EGD) WITH PROPOFOL;  Surgeon: Tressia Danas, MD;  Location: [facility] ENDOSCOPY;  Service: Gastroenterology;  Laterality: N/A;   HERNIA REPAIR     PANENDOSCOPY N/A 03/04/2017   Procedure: PANENDOSCOPY WITH POSSIBLE FOREIGN BODY REMOVAL;  Surgeon: Flo Shanks, MD;  Location: [facility] OR;  Service: ENT;  Laterality: N/A;   ROTATOR CUFF REPAIR Left    ROTATOR CUFF REPAIR Right 07/27/2019   SHOULDER ARTHROSCOPY WITH SUBACROMIAL DECOMPRESSION, ROTATOR CUFF REPAIR AND BICEP TENDON REPAIR Right 07/28/2019   Procedure: RIGHT SHOULDER ARTHROSCOP, MINI OPEN ROTATOR CUFF TEAR REPAIR,  BICEPS TENODESIS, DISTAL CLAVICLE EXCISION;  Surgeon: Cammy Copa, MD;  Location: [facility] OR;   Service: Orthopedics;  Laterality: Right;   SPINE SURGERY     TOOTH EXTRACTION N/A 02/22/2021   Procedure: DENTAL RESTORATION/EXTRACTIONS;  Surgeon: Ocie Doyne, DMD;  Location: [facility] OR;  Service: Oral Surgery;  Laterality: N/A;   TRACHEOSTOMY  02/2017   TUBAL LIGATION     VESICOVAGINAL FISTULA CLOSURE W/ TAH  2009    Social:  Lives With:husband  Support:Husband and family of 10 children  Level of Function:dependent  KGM:WNUUVO, Enobong, MD Substances:Will need evaluated   Family History: Per chart review:  Mother: MI, CVA, DM, HTN Father: CVA  Allergies: Allergies as of 07/18/2023 - Review Complete 07/17/2023  Allergen Reaction Noted   Reglan [metoclopramide] Other (See Comments) 06/04/2014  Ritalin: causes panic attacks   Review of Systems: A complete ROS was negative except as per HPI.   OBJECTIVE:   Physical Exam: Blood pressure 129/77, pulse (!) 111, temperature 98.5 F (36.9 C), temperature source Oral, resp. rate 20, height 5\' 1"  (1.549 m), weight 110.2 kg, SpO2 100%.  Constitutional: Chronically ill appearing, laying in bed, tracheostomy with gauze, NAD  HENT: normocephalic atraumatic, mucous membranes moist Cardiovascular: tachycardic with regular rhythm, no m/r/g, chest pain is reproducible  Pulmonary/Chest: increased work of breathing while speaking, on 6L supplemental O2,  lungs clear to auscultation bilaterally. No crackles, wheezes, rhonchi  Abdominal: soft, epigastric tenderness, non-distended.  Neurological: alert & oriented x 3, moving all four extremities spontaneously.  MSK: no gross abnormalities. No pitting edema Skin: warm and dry Psych: Normal mood and affect  Labs: CBC    Component Value Date/Time   WBC 14.9 (H) 07/18/2023 2316   RBC 4.50 07/18/2023 2316   HGB 13.9 07/18/2023 2317   HGB 11.9 04/12/2018 1035   HCT 41.0 07/18/2023 2317   HCT 34.8 04/12/2018 1035   PLT 432 (H) 07/18/2023 2316   PLT 373 04/12/2018 1035   MCV 85.6 07/18/2023  2316   MCV 84 04/12/2018 1035   MCH 27.6 07/18/2023 2316   MCHC 32.2 07/18/2023 2316   RDW 14.1 07/18/2023 2316   RDW 14.6 04/12/2018 1035   LYMPHSABS 6.9 (H) 07/18/2023 2316   LYMPHSABS 4.0 (H) 04/12/2018 1035   MONOABS 0.8 07/18/2023 2316   EOSABS 0.1 07/18/2023 2316   EOSABS 0.0 04/12/2018 1035   BASOSABS 0.0 07/18/2023 2316   BASOSABS 0.0 04/12/2018 1035     CMP     Component Value Date/Time   NA 142 07/18/2023 2317   NA 142 04/06/2023 1156   K 3.9 07/18/2023  2317   CL 104 07/18/2023 2317   CO2 20 (L) 07/18/2023 2316   GLUCOSE 265 (H) 07/18/2023 2317   BUN 20 07/18/2023 2317   BUN 15 04/06/2023 1156   CREATININE 2.00 (H) 07/18/2023 2317   CREATININE 0.93 08/01/2015 1202   CALCIUM 10.1 07/18/2023 2316   PROT 8.7 (H) 07/18/2023 2316   PROT 7.8 04/06/2023 1156   ALBUMIN 4.2 07/18/2023 2316   ALBUMIN 4.5 04/06/2023 1156   AST 23 07/18/2023 2316   ALT 23 07/18/2023 2316   ALKPHOS 114 07/18/2023 2316   BILITOT 0.7 07/18/2023 2316   BILITOT <0.2 04/06/2023 1156   GFRNONAA 32 (L) 07/18/2023 2316   GFRAA 68 04/10/2020 1634    Imaging: DG Chest Port 1 View  Result Date: 07/18/2023 CLINICAL DATA:  Questionable sepsis EXAM: PORTABLE CHEST 1 VIEW COMPARISON:  Chest x-ray 07/17/2023 FINDINGS: Tracheostomy is unchanged in position. Lung volumes are low likely accentuating central pulmonary vascularity. The heart is mildly enlarged. There is no lung consolidation, pleural effusion or pneumothorax. No acute fractures are seen. IMPRESSION: Low lung volumes likely accentuating central pulmonary vascularity. No lung consolidation. Electronically Signed   By: Darliss Cheney M.D.   On: 07/18/2023 23:22      EKG: personally reviewed my interpretation is sinus tachycardia without ST segment changes .   ASSESSMENT & PLAN:   Assessment & Plan by Problem: Principal Problem:   Acute on chronic hypoxic respiratory failure (HCC) Active Problems:   Tracheostomy dependent (HCC)    Asthma   Syncope   Increased anion gap metabolic acidosis   [name] is a 52 y.o. person living with a history ofHTN, HFpEF, chronic respiratory failure 3 L intermittent supplemental oxygen use, severe persistent asthma s/p trach dependent, chronic pain, depression, anxiety and diabetes  who presented to the emergency department for shortness of breath admitted for Acute on chronic respiratory failure on hospital day 0  Acute on chronic respiratory failure Patient presented to the emergency department with increasing supplemental oxygen support, increased sputum production, and new cough. Her acute on chronic respiratory failure is possibly due to COPD exacerbation 2/2 recent URI vs asthma exacerbation. The acute on chronic respiratory failure is less likely due to PE, CTA on 10/25 did not reveal a PE. Another consideration although less likely at this time is pneumonia, the CXR did not reveal consolidations. HFpEF exacerbation also considered but less likely at this time, she is euvolemic on exam, CXR does not reveal pulmonary edema and, BNP is 20.1.  Plan: -Titrate O2 to home supplemental O2 of 3L  -Scheduled duonebs q4prn   -Azithromycin and ceftriaxone IV -Begin solu-medrol 40mg   -Begin saline nebulizer  -Plan to consult pulmonology today for assistance with tracheostomy care:ED exchanged size 4 uncuffed to a size 6 cuffed -NPO, SLP evaluation appreciated   Anion gap metabolic acidosis 2/2 lactic acidosis  Leukocytosis  Patient presented with an anion gap of 18 and lactic acidosis of 3.7 that improved to 2.5. The lactic acidosis is likely due to underlying infection (UTI vs respiratory. Anion gap metabolic acidosis is less likely from DKA, her BG is 267.  Plan: -Blood cultures ordered -U/A ordered -urine cultures ordered -Does not appear to be dehydrated  -Repeat CBC tomorrow morning  -Evaluate for ketones in U/A  -Continue ceftriaxone and azithromycin -Repeat  lactic acid ordered  Syncopal episodes  Based on prior records this does not appear to be an acute problem. Syncope is likely multifactorial from vasovagal (pt was experiencing similar  symptoms in the ED while coughing) and possibly hypoglycemic. We will monitor her on telemetry for arrhythmias.  Plan: -Orthostatic vital signs  -Hold amitriptyline -Hold Lisinopril, Furosemide -Begin telemetry   AKI  She presented to the ED with an increase in Scr to 1.88, her baseline appears to range from 1.3 to 1.5. AKI is likely from oral intake, she reports no change in appetite or oral intake; however, the ED provider on 10/25 reported "very poor oral intake". There was some concern for urinary retention due to patient's decreased urinary frequency, but the bladder scan showed . Will evaluate for obstructive causes with a renal ultrasound if the Scr does not improve after 500cc of LR. Plan: -500cc LR  -Holding BP meds: Lisinopril and Lasix  -Evaluate for improvement in Scr with BMP   Chest Pain Most likely MSK due to increase in coughing from recent URI, less likely ACS with a  reassuring troponin of 5 and EKG that did not show ST elevation or depression and reproducible chest pain on exam.  Plan: -Continue robitussin for cough management  DM Home regimen is 20 units tresiba in the morning, she reported episodes of hypoglycemia into the 60s.  Plan:  -Decreased semglee to 15 units  -Begin SSI to evaluate for meal time coverage needs due to home hypoglycemia   HFpEF LVEF 50% (01/2023) Appears compensated, physical exam shows euvolemia, BNP is 20.1, and CXR does not show pulmonary edema.  Plan:  -Holding lisinopril  -Holding Lasix   Chronic Pain 2/2 lumbar pain  Hold Percocet 10-325mg  q8 prn until speech evaluation    Diet: NPO, until speech evaluation  VTE: Enoxaparin Code: Full  Prior to Admission Living Arrangement: Home, living with husband  Anticipated Discharge Location:  Home Barriers to Discharge: Medical Treatment of Acute on chronic respiratory failure   Dispo: Admit patient to Observation with expected length of stay less than 2 midnights.  Signed:   Faith Rogue  Internal Medicine Resident PGY-1 07/19/2023, 7:38 AM   Please contact the on call pager after 5 pm and on weekends at [phone].

## 2023-07-19 NOTE — Progress Notes (Signed)
Pt taken of vent per MD request and placed on ATC at 28%. Pt tol well. Will cont to monitor

## 2023-07-19 NOTE — Progress Notes (Signed)
ED Pharmacy Antibiotic Sign Off An antibiotic consult was received from an ED provider for cefepime and vancomycin per pharmacy dosing for sepsis. A chart review was completed to assess appropriateness.   The following one time order(s) were placed:   -Cefepime 2g IV x1 -Vancomycin 2g IV x1  Further antibiotic and/or antibiotic pharmacy consults should be ordered by the admitting provider if indicated.   Thank you for allowing pharmacy to be a part of this patient's care.   Arabella Merles, [facility]  Clinical Pharmacist 07/19/23 2:08 AM

## 2023-07-19 NOTE — ED Notes (Signed)
ED TO INPATIENT HANDOFF REPORT  ED Nurse Name and Phone #:  Lucious Groves [phone]  S Name/Age/Gender [name] 52 y.o. female Room/Bed: TRAAC/TRAAC  Code Status   Code Status: Prior  Home/SNF/Other Home Patient oriented to: self, place, time, and situation Is this baseline? Yes   Triage Complete: Triage complete  Chief Complaint Respiratory Distress  Triage Note Patient arrived with EMS from home this evening , worsening SOB , she has a tracheostomy , received 2 Albuterol / Atrovent nebulizer treatments by EMS prior to arrival .    Allergies Allergies  Allergen Reactions   Reglan [Metoclopramide] Other (See Comments)    Panic attack    Level of Care/Admitting Diagnosis ED Disposition     ED Disposition  Admit   Condition  --   Comment  The patient appears reasonably stabilized for admission considering the current resources, flow, and capabilities available in the ED at this time, and I doubt any other [facility] requiring further screening and/or treatment in the ED prior to admission is  present.          B Medical/Surgery History Past Medical History:  Diagnosis Date   Abnormal UGI series    Acute pain of right shoulder due to trauma 10/05/2018   Acute respiratory failure with hypoxia and hypercapnia (HCC) 10/23/2018   Allergy    Anemia    Anxiety    Arthritis    Asthma    Chest pain of uncertain etiology 03/22/2019   Chronic back pain    Chronic chest pain    resolved, no problems since 2019 per patient 07/27/19   Coffee ground vomiting 10/14/2021   COPD (chronic obstructive pulmonary disease) (HCC)    inhaler   Depression    DM (diabetes mellitus) (HCC)    INSULIN DEPENDENT - TYPE 1   Dyspnea    GERD (gastroesophageal reflux disease)    Headache(784.0)    History of hiatal hernia    Hyperlipidemia    no med, diet controlled   Hypertension    Hypokalemia    Klebsiella cystitis 03/14/2017   Last Assessment & Plan:   Formatting  of this note might be different from the original.  - On Zosyn  - Urine culture: Klebsiella pneumoniae   Neuromuscular disorder (HCC)    Osteoarthritis    Oxygen deficiency    Pneumonia    Respiratory disease 05/2017   uses inhalers, neb tx prn, no oxygen   Rotator cuff tear 07/28/2019   Sleep apnea    does not use CPAP due to trach, uses 2L supplement oxygen at night   Status post tracheostomy (HCC) 03/14/2017   Last Assessment & Plan:  Formatting of this note might be different from the original. - VERY CRITICAL AIRWAY WITH EXTREME PRECUATION - On tach - ENT following along (please see above for current trach management plan)   Tracheostomy in place [facility]) 02/2017   Past Surgical History:  Procedure Laterality Date   ABDOMINAL HYSTERECTOMY  2009   APPENDECTOMY     BIOPSY  10/15/2021   Procedure: BIOPSY;  Surgeon: Tressia Danas, MD;  Location: [facility] ENDOSCOPY;  Service: Gastroenterology;;   CESAREAN SECTION     x 3   CHOLECYSTECTOMY N/A 03/02/2014   Procedure: LAPAROSCOPIC CHOLECYSTECTOMY;  Surgeon: Clovis Pu. Cornett, MD;  Location: [facility] OR;  Service: General;  Laterality: N/A;   ESOPHAGOGASTRODUODENOSCOPY (EGD) WITH PROPOFOL N/A 10/15/2021   Procedure: ESOPHAGOGASTRODUODENOSCOPY (EGD) WITH PROPOFOL;  Surgeon: Tressia Danas, MD;  Location: [facility]  ENDOSCOPY;  Service: Gastroenterology;  Laterality: N/A;   HERNIA REPAIR     PANENDOSCOPY N/A 03/04/2017   Procedure: PANENDOSCOPY WITH POSSIBLE FOREIGN BODY REMOVAL;  Surgeon: Flo Shanks, MD;  Location: [facility] OR;  Service: ENT;  Laterality: N/A;   ROTATOR CUFF REPAIR Left    ROTATOR CUFF REPAIR Right 07/27/2019   SHOULDER ARTHROSCOPY WITH SUBACROMIAL DECOMPRESSION, ROTATOR CUFF REPAIR AND BICEP TENDON REPAIR Right 07/28/2019   Procedure: RIGHT SHOULDER ARTHROSCOP, MINI OPEN ROTATOR CUFF TEAR REPAIR,  BICEPS TENODESIS, DISTAL CLAVICLE EXCISION;  Surgeon: Cammy Copa, MD;  Location: [facility] OR;  Service: Orthopedics;  Laterality: Right;    SPINE SURGERY     TOOTH EXTRACTION N/A 02/22/2021   Procedure: DENTAL RESTORATION/EXTRACTIONS;  Surgeon: Ocie Doyne, DMD;  Location: [facility] OR;  Service: Oral Surgery;  Laterality: N/A;   TRACHEOSTOMY  02/2017   TUBAL LIGATION     VESICOVAGINAL FISTULA CLOSURE W/ TAH  2009     A IV Location/Drains/Wounds Patient Lines/Drains/Airways Status     Active Line/Drains/Airways     Name Placement date Placement time Site Days   Peripheral IV 07/18/23 18 G Right Antecubital 07/18/23  2311  Antecubital  1   Peripheral IV 07/18/23 20 G Left Forearm 07/18/23  2333  Forearm  1   Tracheostomy Shiley Flexible 6 mm Cuffed 07/18/23  2350  6 mm  1            Intake/Output Last 24 hours  Intake/Output Summary (Last 24 hours) at 07/19/2023 0510 Last data filed at 07/19/2023 0418 Gross per 24 hour  Intake 500 ml  Output --  Net 500 ml    Labs/Imaging Results for orders placed or performed during the hospital encounter of 07/18/23 (from the past 48 hour(s))  Comprehensive metabolic panel     Status: Abnormal   Collection Time: 07/18/23 11:16 PM  Result Value Ref Range   Sodium 139 135 - 145 mmol/L   Potassium 3.9 3.5 - 5.1 mmol/L   Chloride 101 98 - 111 mmol/L   CO2 20 (L) 22 - 32 mmol/L   Glucose, Bld 267 (H) 70 - 99 mg/dL    Comment: Glucose reference range applies only to samples taken after fasting for at least 8 hours.   BUN 18 6 - 20 mg/dL   Creatinine, Ser 0.98 (H) 0.44 - 1.00 mg/dL   Calcium 11.9 8.9 - 14.7 mg/dL   Total Protein 8.7 (H) 6.5 - 8.1 g/dL   Albumin 4.2 3.5 - 5.0 g/dL   AST 23 15 - 41 U/L   ALT 23 0 - 44 U/L   Alkaline Phosphatase 114 38 - 126 U/L   Total Bilirubin 0.7 0.3 - 1.2 mg/dL   GFR, Estimated 32 (L) >60 mL/min    Comment: (NOTE) Calculated using the CKD-EPI Creatinine Equation (2021)    Anion gap 18 (H) 5 - 15    Comment: Performed at [facility], [address] [place], [place] [address]  CBC with Differential     Status: Abnormal    Collection Time: 07/18/23 11:16 PM  Result Value Ref Range   WBC 14.9 (H) 4.0 - 10.5 K/uL   RBC 4.50 3.87 - 5.11 MIL/uL   Hemoglobin 12.4 12.0 - 15.0 g/dL   HCT 21.3 08.6 - 57.8 %   MCV 85.6 80.0 - 100.0 fL   MCH 27.6 26.0 - 34.0 pg   MCHC 32.2 30.0 - 36.0 g/dL   RDW 46.9 62.9 - 52.8 %  Platelets 432 (H) 150 - 400 K/uL   nRBC 0.0 0.0 - 0.2 %   Neutrophils Relative % 48 %   Neutro Abs 7.1 1.7 - 7.7 K/uL   Lymphocytes Relative 46 %   Lymphs Abs 6.9 (H) 0.7 - 4.0 K/uL   Monocytes Relative 5 %   Monocytes Absolute 0.8 0.1 - 1.0 K/uL   Eosinophils Relative 1 %   Eosinophils Absolute 0.1 0.0 - 0.5 K/uL   Basophils Relative 0 %   Basophils Absolute 0.0 0.0 - 0.1 K/uL   Immature Granulocytes 0 %   Abs Immature Granulocytes 0.04 0.00 - 0.07 K/uL    Comment: Performed at [facility], [address] [place], [place] [address]  Protime-INR     Status: None   Collection Time: 07/18/23 11:16 PM  Result Value Ref Range   Prothrombin Time 13.8 11.4 - 15.2 seconds   INR 1.0 0.8 - 1.2    Comment: (NOTE) INR goal varies based on device and disease states. Performed at [facility], [address], [place], [place] [address]   APTT     Status: None   Collection Time: 07/18/23 11:16 PM  Result Value Ref Range   aPTT 25 24 - 36 seconds    Comment: Performed at [facility], [address] [place], [place] [address]  Brain natriuretic peptide     Status: None   Collection Time: 07/18/23 11:16 PM  Result Value Ref Range   B Natriuretic Peptide 20.1 0.0 - 100.0 pg/mL    Comment: Performed at [facility], [address] [place], [place] [address]  Troponin I (High Sensitivity)     Status: None   Collection Time: 07/18/23 11:16 PM  Result Value Ref Range   Troponin I (High Sensitivity) 7 <18 ng/L    Comment: (NOTE) Elevated high sensitivity troponin I (hsTnI) values and significant  changes across serial measurements may suggest ACS but many other  chronic and  acute conditions are known to elevate hsTnI results.  Refer to the "Links" section for chest pain algorithms and additional  guidance. Performed at [facility], [address] [place], [place] [address]   I-stat chem 8, ed     Status: Abnormal   Collection Time: 07/18/23 11:17 PM  Result Value Ref Range   Sodium 142 135 - 145 mmol/L   Potassium 3.9 3.5 - 5.1 mmol/L   Chloride 104 98 - 111 mmol/L   BUN 20 6 - 20 mg/dL   Creatinine, Ser 8.84 (H) 0.44 - 1.00 mg/dL   Glucose, Bld 166 (H) 70 - 99 mg/dL    Comment: Glucose reference range applies only to samples taken after fasting for at least 8 hours.   Calcium, Ion 1.22 1.15 - 1.40 mmol/L   TCO2 23 22 - 32 mmol/L   Hemoglobin 13.9 12.0 - 15.0 g/dL   HCT 06.3 01.6 - 01.0 %  I-Stat Lactic Acid, ED     Status: Abnormal   Collection Time: 07/18/23 11:18 PM  Result Value Ref Range   Lactic Acid, Venous 3.7 (HH) 0.5 - 1.9 mmol/L   Comment NOTIFIED PHYSICIAN   Troponin I (High Sensitivity)     Status: None   Collection Time: 07/19/23  1:00 AM  Result Value Ref Range   Troponin I (High Sensitivity) 7 <18 ng/L    Comment: (NOTE) Elevated high sensitivity troponin I (hsTnI) values and significant  changes across serial measurements may  suggest ACS but many other  chronic and acute conditions are known to elevate hsTnI results.  Refer to the "Links" section for chest pain algorithms and additional  guidance. Performed at [facility], [address] [place], [place] [address]   Resp panel by RT-PCR (RSV, Flu A&B, Covid) Anterior Nasal Swab     Status: None   Collection Time: 07/19/23  1:00 AM   Specimen: Anterior Nasal Swab  Result Value Ref Range   SARS Coronavirus 2 by RT PCR NEGATIVE NEGATIVE   Influenza A by PCR NEGATIVE NEGATIVE   Influenza B by PCR NEGATIVE NEGATIVE    Comment: (NOTE) The Xpert Xpress SARS-CoV-2/FLU/RSV plus assay is intended as an aid in the diagnosis of influenza from Nasopharyngeal swab  specimens and should not be used as a sole basis for treatment. Nasal washings and aspirates are unacceptable for Xpert Xpress SARS-CoV-2/FLU/RSV testing.  Fact Sheet for Patients: BloggerCourse.com  Fact Sheet for Healthcare Providers: SeriousBroker.it  This test is not yet approved or cleared by the [place] FDA and has been authorized for detection and/or diagnosis of SARS-CoV-2 by FDA under an Emergency Use Authorization (EUA). This EUA will remain in effect (meaning this test can be used) for the duration of the COVID-19 declaration under Section 564(b)(1) of the Act, 21 U.S.C. section 360bbb-3(b)(1), unless the authorization is terminated or revoked.     Resp Syncytial Virus by PCR NEGATIVE NEGATIVE    Comment: (NOTE) Fact Sheet for Patients: BloggerCourse.com  Fact Sheet for Healthcare Providers: SeriousBroker.it  This test is not yet approved or cleared by the [place] FDA and has been authorized for detection and/or diagnosis of SARS-CoV-2 by FDA under an Emergency Use Authorization (EUA). This EUA will remain in effect (meaning this test can be used) for the duration of the COVID-19 declaration under Section 564(b)(1) of the Act, 21 U.S.C. section 360bbb-3(b)(1), unless the authorization is terminated or revoked.  Performed at [facility], [address] [place], [place] [address]   I-Stat Lactic Acid, ED     Status: Abnormal   Collection Time: 07/19/23  1:09 AM  Result Value Ref Range   Lactic Acid, Venous 2.5 (HH) 0.5 - 1.9 mmol/L   Comment NOTIFIED PHYSICIAN    *Note: Due to a large number of results and/or encounters for the requested time period, some results have not been displayed. A complete set of results can be found in Results Review.   DG Chest Port 1 View  Result Date: 07/18/2023 CLINICAL DATA:  Questionable sepsis EXAM: PORTABLE  CHEST 1 VIEW COMPARISON:  Chest x-ray 07/17/2023 FINDINGS: Tracheostomy is unchanged in position. Lung volumes are low likely accentuating central pulmonary vascularity. The heart is mildly enlarged. There is no lung consolidation, pleural effusion or pneumothorax. No acute fractures are seen. IMPRESSION: Low lung volumes likely accentuating central pulmonary vascularity. No lung consolidation. Electronically Signed   By: Darliss Cheney M.D.   On: 07/18/2023 23:22   CT Angio Chest Pulmonary Embolism (PE) W or WO Contrast  Result Date: 07/17/2023 CLINICAL DATA:  High probability for PE EXAM: CT ANGIOGRAPHY CHEST WITH CONTRAST TECHNIQUE: Multidetector CT imaging of the chest was performed using the standard protocol during bolus administration of intravenous contrast. Multiplanar CT image reconstructions and MIPs were obtained to evaluate the vascular anatomy. RADIATION DOSE REDUCTION: This exam was performed according to the departmental dose-optimization program which includes automated exposure control, adjustment of the mA and/or kV according to patient size and/or use  of iterative reconstruction technique. CONTRAST:  75mL OMNIPAQUE IOHEXOL 350 MG/ML SOLN COMPARISON:  CT angiogram chest abdomen and pelvis 03/10/2023 FINDINGS: Cardiovascular: Satisfactory opacification of the pulmonary arteries to the segmental level. No evidence of pulmonary embolism. Normal heart size. No pericardial effusion. Mediastinum/Nodes: No enlarged mediastinal, hilar, or axillary lymph nodes. Thyroid gland, trachea, and esophagus demonstrate no significant abnormality. Tracheostomy in place. Lungs/Pleura: Lungs are clear. No pleural effusion or pneumothorax. Upper Abdomen: No acute abnormality. Musculoskeletal: No chest wall abnormality. No acute or significant osseous findings. Review of the MIP images confirms the above findings. IMPRESSION: 1. No evidence for pulmonary embolism or other acute intrathoracic process. 2. Tracheostomy  in place. Electronically Signed   By: Darliss Cheney M.D.   On: 07/17/2023 20:55   CT Head Wo Contrast  Result Date: 07/17/2023 CLINICAL DATA:  Head trauma, moderate-severe; Neck trauma, dangerous injury mechanism (Age 27-64y) EXAM: CT HEAD WITHOUT CONTRAST CT CERVICAL SPINE WITHOUT CONTRAST TECHNIQUE: Multidetector CT imaging of the head and cervical spine was performed following the standard protocol without intravenous contrast. Multiplanar CT image reconstructions of the cervical spine were also generated. RADIATION DOSE REDUCTION: This exam was performed according to the departmental dose-optimization program which includes automated exposure control, adjustment of the mA and/or kV according to patient size and/or use of iterative reconstruction technique. COMPARISON:  None Available. FINDINGS: CT HEAD FINDINGS Brain: No evidence of acute infarction, hemorrhage, hydrocephalus, extra-axial collection or mass lesion/mass effect. Partially empty sella. Vascular: No hyperdense vessel. Skull: No acute fracture. Sinuses/Orbits: Clear sinuses.  No acute orbital findings. Other: No mastoid effusions. CT CERVICAL SPINE FINDINGS Alignment: Straightening.  No substantial sagittal subluxation. Skull base and vertebrae: No acute fracture. No primary bone lesion or focal pathologic process. Soft tissues and spinal canal: No prevertebral fluid or swelling. No visible canal hematoma. Disc levels:  No significant bony degenerative change. Upper chest: Visualized lung apices are clear. IMPRESSION: No evidence of acute abnormality intracranially or in the cervical spine. Electronically Signed   By: Feliberto Harts M.D.   On: 07/17/2023 17:11   CT Cervical Spine Wo Contrast  Result Date: 07/17/2023 CLINICAL DATA:  Head trauma, moderate-severe; Neck trauma, dangerous injury mechanism (Age 4-64y) EXAM: CT HEAD WITHOUT CONTRAST CT CERVICAL SPINE WITHOUT CONTRAST TECHNIQUE: Multidetector CT imaging of the head and cervical  spine was performed following the standard protocol without intravenous contrast. Multiplanar CT image reconstructions of the cervical spine were also generated. RADIATION DOSE REDUCTION: This exam was performed according to the departmental dose-optimization program which includes automated exposure control, adjustment of the mA and/or kV according to patient size and/or use of iterative reconstruction technique. COMPARISON:  None Available. FINDINGS: CT HEAD FINDINGS Brain: No evidence of acute infarction, hemorrhage, hydrocephalus, extra-axial collection or mass lesion/mass effect. Partially empty sella. Vascular: No hyperdense vessel. Skull: No acute fracture. Sinuses/Orbits: Clear sinuses.  No acute orbital findings. Other: No mastoid effusions. CT CERVICAL SPINE FINDINGS Alignment: Straightening.  No substantial sagittal subluxation. Skull base and vertebrae: No acute fracture. No primary bone lesion or focal pathologic process. Soft tissues and spinal canal: No prevertebral fluid or swelling. No visible canal hematoma. Disc levels:  No significant bony degenerative change. Upper chest: Visualized lung apices are clear. IMPRESSION: No evidence of acute abnormality intracranially or in the cervical spine. Electronically Signed   By: Feliberto Harts M.D.   On: 07/17/2023 17:11   DG Chest 2 View  Result Date: 07/17/2023 CLINICAL DATA:  Shortness of breath. EXAM: CHEST - 2 VIEW COMPARISON:  01/28/2023. FINDINGS: Low lung volume. Again seen is elevated right hemidiaphragm. Bilateral lung fields are clear. Bilateral costophrenic angles are clear. Normal cardio-mediastinal silhouette. No acute osseous abnormalities. The soft tissues are within normal limits. Tracheostomy tube seen with its tip approximately 6.3 cm above the carina. There are surgical clips in the right upper quadrant, typical of a previous cholecystectomy. IMPRESSION: *No active cardiopulmonary disease. Electronically Signed   By: Jules Schick M.D.   On: 07/17/2023 16:55    Pending Labs Unresulted Labs (From admission, onward)     Start     Ordered   07/18/23 2309  Blood Culture (routine x 2)  (Undifferentiated presentation (screening labs and basic nursing orders))  BLOOD CULTURE X 2,   STAT      07/18/23 2309   07/18/23 2309  Expectorated Sputum Assessment w Gram Stain, Rflx to Resp Cult  (Undifferentiated presentation (screening labs and basic nursing orders))  ONCE - URGENT,   URGENT        07/18/23 2309            Vitals/Pain Today's Vitals   07/19/23 0338 07/19/23 0359 07/19/23 0401 07/19/23 0415  BP:   (!) 142/69 133/77  Pulse:   (!) 114 (!) 115  Resp:   20   Temp:      TempSrc:      SpO2:   100% 100%  Weight:      Height:      PainSc: 0-No pain 0-No pain 0-No pain     Isolation Precautions No active isolations  Medications Medications  morphine (PF) 4 MG/ML injection 4 mg (4 mg Intravenous Given 07/19/23 0032)  ondansetron (ZOFRAN) injection 4 mg (4 mg Intravenous Given 07/19/23 0029)  ceFEPIme (MAXIPIME) 2 g in sodium chloride 0.9 % 100 mL IVPB (0 g Intravenous Stopped 07/19/23 0246)  vancomycin (VANCOREADY) IVPB 2000 mg/400 mL (0 mg Intravenous Stopped 07/19/23 0418)  morphine (PF) 4 MG/ML injection 4 mg (4 mg Intravenous Given 07/19/23 3710)    Mobility walks     Focused Assessments     R Recommendations: See Admitting Provider Note  Report given to:   Additional Notes:

## 2023-07-19 NOTE — ED Notes (Signed)
ED TO INPATIENT HANDOFF REPORT  ED Nurse Name and Phone #: Vernona Rieger [phone]  S Name/Age/Gender [name] Mae [name] 52 y.o. female Room/Bed: 003C/003C  Code Status   Code Status: Full Code  Home/SNF/Other Home Patient oriented to: self, place, time, and situation Is this baseline? Yes   Triage Complete: Triage complete  Chief Complaint Acute on chronic hypoxic respiratory failure (HCC) [J96.21]  Triage Note Patient arrived with EMS from home this evening , worsening SOB , she has a tracheostomy , received 2 Albuterol / Atrovent nebulizer treatments by EMS prior to arrival .    Allergies Allergies  Allergen Reactions   Reglan [Metoclopramide] Other (See Comments)    Panic attack    Level of Care/Admitting Diagnosis ED Disposition     ED Disposition  Admit   Condition  --   Comment  Hospital [facility]: [facility] [[id number]]  Level of Care: Progressive [102]  Admit to Progressive based on following criteria: RESPIRATORY PROBLEMS hypoxemic/hypercapnic respiratory failure that is responsive to NIPPV (BiPAP) or High Flow Nasal Cannula (6-80 lpm). Frequent assessment/intervention, no > Q2 hrs < Q4 hrs, to maintain oxygenation and pulmonary hygiene.  May admit patient to [facility] or [facility] if equivalent level of care is available:: No  Covid Evaluation: Confirmed COVID Negative  Diagnosis: Acute on chronic hypoxic respiratory failure [facility]) [[id number]]  Admitting Physician: Tyson Alias [[id number]]  Attending Physician: Tyson Alias [[id number]]  Certification:: I certify this patient will need inpatient services for at least 2 midnights  Expected Medical Readiness: 07/22/2023          B Medical/Surgery History Past Medical History:  Diagnosis Date   Abnormal UGI series    Acute pain of right shoulder due to trauma 10/05/2018   Acute respiratory failure with hypoxia and hypercapnia (HCC) 10/23/2018   Allergy    Anemia     Anxiety    Arthritis    Asthma    Chest pain of uncertain etiology 03/22/2019   Chronic back pain    Chronic chest pain    resolved, no problems since 2019 per patient 07/27/19   Coffee ground vomiting 10/14/2021   COPD (chronic obstructive pulmonary disease) (HCC)    inhaler   Depression    DM (diabetes mellitus) (HCC)    INSULIN DEPENDENT - TYPE 1   Dyspnea    GERD (gastroesophageal reflux disease)    Headache(784.0)    History of hiatal hernia    Hyperlipidemia    no med, diet controlled   Hypertension    Hypokalemia    Klebsiella cystitis 03/14/2017   Last Assessment & Plan:   Formatting of this note might be different from the original.  - On Zosyn  - Urine culture: Klebsiella pneumoniae   Neuromuscular disorder (HCC)    Osteoarthritis    Oxygen deficiency    Pneumonia    Respiratory disease 05/2017   uses inhalers, neb tx prn, no oxygen   Rotator cuff tear 07/28/2019   Sleep apnea    does not use CPAP due to trach, uses 2L supplement oxygen at night   Status post tracheostomy (HCC) 03/14/2017   Last Assessment & Plan:  Formatting of this note might be different from the original. - VERY CRITICAL AIRWAY WITH EXTREME PRECUATION - On tach - ENT following along (please see above for current trach management plan)   Tracheostomy in place [facility]) 02/2017   Past Surgical History:  Procedure Laterality Date   ABDOMINAL HYSTERECTOMY  2009  APPENDECTOMY     BIOPSY  10/15/2021   Procedure: BIOPSY;  Surgeon: Tressia Danas, MD;  Location: [facility] ENDOSCOPY;  Service: Gastroenterology;;   CESAREAN SECTION     x 3   CHOLECYSTECTOMY N/A 03/02/2014   Procedure: LAPAROSCOPIC CHOLECYSTECTOMY;  Surgeon: Clovis Pu. Cornett, MD;  Location: [facility] OR;  Service: General;  Laterality: N/A;   ESOPHAGOGASTRODUODENOSCOPY (EGD) WITH PROPOFOL N/A 10/15/2021   Procedure: ESOPHAGOGASTRODUODENOSCOPY (EGD) WITH PROPOFOL;  Surgeon: Tressia Danas, MD;  Location: [facility] ENDOSCOPY;  Service: Gastroenterology;   Laterality: N/A;   HERNIA REPAIR     PANENDOSCOPY N/A 03/04/2017   Procedure: PANENDOSCOPY WITH POSSIBLE FOREIGN BODY REMOVAL;  Surgeon: Flo Shanks, MD;  Location[facility] OR;  Service: ENT;  Laterality: N/A;   ROTATOR CUFF REPAIR Left    ROTATOR CUFF REPAIR Right 07/27/2019   SHOULDER ARTHROSCOPY WITH SUBACROMIAL DECOMPRESSION, ROTATOR CUFF REPAIR AND BICEP TENDON REPAIR Right 07/28/2019   Procedure: RIGHT SHOULDER ARTHROSCOP, MINI OPEN ROTATOR CUFF TEAR REPAIR,  BICEPS TENODESIS, DISTAL CLAVICLE EXCISION;  Surgeon: Cammy Copa, MD;  Location: [facility] OR;  Service: Orthopedics;  Laterality: Right;   SPINE SURGERY     TOOTH EXTRACTION N/A 02/22/2021   Procedure: DENTAL RESTORATION/EXTRACTIONS;  Surgeon: Ocie Doyne, DMD;  Location: [facility] OR;  Service: Oral Surgery;  Laterality: N/A;   TRACHEOSTOMY  02/2017   TUBAL LIGATION     VESICOVAGINAL FISTULA CLOSURE W/ TAH  2009     A IV Location/Drains/Wounds Patient Lines/Drains/Airways Status     Active Line/Drains/Airways     Name Placement date Placement time Site Days   Peripheral IV 07/18/23 18 G Right Antecubital 07/18/23  2311  Antecubital  1   Peripheral IV 07/18/23 20 G Left Forearm 07/18/23  2333  Forearm  1   Tracheostomy Shiley Flexible 4 mm Uncuffed 07/19/23  1033  4 mm  less than 1            Intake/Output Last 24 hours  Intake/Output Summary (Last 24 hours) at 07/19/2023 1317 Last data filed at 07/19/2023 1111 Gross per 24 hour  Intake 1100 ml  Output --  Net 1100 ml    Labs/Imaging Results for orders placed or performed during the hospital encounter of 07/18/23 (from the past 48 hour(s))  Comprehensive metabolic panel     Status: Abnormal   Collection Time: 07/18/23 11:16 PM  Result Value Ref Range   Sodium 139 135 - 145 mmol/L   Potassium 3.9 3.5 - 5.1 mmol/L   Chloride 101 98 - 111 mmol/L   CO2 20 (L) 22 - 32 mmol/L   Glucose, Bld 267 (H) 70 - 99 mg/dL    Comment: Glucose reference range applies only  to samples taken after fasting for at least 8 hours.   BUN 18 6 - 20 mg/dL   Creatinine, Ser 5.62 (H) 0.44 - 1.00 mg/dL   Calcium 13.0 8.9 - 86.5 mg/dL   Total Protein 8.7 (H) 6.5 - 8.1 g/dL   Albumin 4.2 3.5 - 5.0 g/dL   AST 23 15 - 41 U/L   ALT 23 0 - 44 U/L   Alkaline Phosphatase 114 38 - 126 U/L   Total Bilirubin 0.7 0.3 - 1.2 mg/dL   GFR, Estimated 32 (L) >60 mL/min    Comment: (NOTE) Calculated using the CKD-EPI Creatinine Equation (2021)    Anion gap 18 (H) 5 - 15    Comment: Performed at [facility], [address] [place], [place] [address]  CBC with Differential  Status: Abnormal   Collection Time: 07/18/23 11:16 PM  Result Value Ref Range   WBC 14.9 (H) 4.0 - 10.5 K/uL   RBC 4.50 3.87 - 5.11 MIL/uL   Hemoglobin 12.4 12.0 - 15.0 g/dL   HCT 54.0 98.1 - 19.1 %   MCV 85.6 80.0 - 100.0 fL   MCH 27.6 26.0 - 34.0 pg   MCHC 32.2 30.0 - 36.0 g/dL   RDW 47.8 29.5 - 62.1 %   Platelets 432 (H) 150 - 400 K/uL   nRBC 0.0 0.0 - 0.2 %   Neutrophils Relative % 48 %   Neutro Abs 7.1 1.7 - 7.7 K/uL   Lymphocytes Relative 46 %   Lymphs Abs 6.9 (H) 0.7 - 4.0 K/uL   Monocytes Relative 5 %   Monocytes Absolute 0.8 0.1 - 1.0 K/uL   Eosinophils Relative 1 %   Eosinophils Absolute 0.1 0.0 - 0.5 K/uL   Basophils Relative 0 %   Basophils Absolute 0.0 0.0 - 0.1 K/uL   Immature Granulocytes 0 %   Abs Immature Granulocytes 0.04 0.00 - 0.07 K/uL    Comment: Performed at [facility], [address] [place], [place] [address]  Protime-INR     Status: None   Collection Time: 07/18/23 11:16 PM  Result Value Ref Range   Prothrombin Time 13.8 11.4 - 15.2 seconds   INR 1.0 0.8 - 1.2    Comment: (NOTE) INR goal varies based on device and disease states. Performed at [facility], [address] [place], [place] [address]   APTT     Status: None   Collection Time: 07/18/23 11:16 PM  Result Value Ref Range   aPTT 25 24 - 36 seconds    Comment: Performed at [facility], [address] [place], [place] [address]  Brain natriuretic peptide     Status: None   Collection Time: 07/18/23 11:16 PM  Result Value Ref Range   B Natriuretic Peptide 20.1 0.0 - 100.0 pg/mL    Comment: Performed at [facility], [address] [place], [place] [address]  Troponin I (High Sensitivity)     Status: None   Collection Time: 07/18/23 11:16 PM  Result Value Ref Range   Troponin I (High Sensitivity) 7 <18 ng/L    Comment: (NOTE) Elevated high sensitivity troponin I (hsTnI) values and significant  changes across serial measurements may suggest ACS but many other  chronic and acute conditions are known to elevate hsTnI results.  Refer to the "Links" section for chest pain algorithms and additional  guidance. Performed at [facility], [address] [place], [place] [address]   I-stat chem 8, ed     Status: Abnormal   Collection Time: 07/18/23 11:17 PM  Result Value Ref Range   Sodium 142 135 - 145 mmol/L   Potassium 3.9 3.5 - 5.1 mmol/L   Chloride 104 98 - 111 mmol/L   BUN 20 6 - 20 mg/dL   Creatinine, Ser 2.72 (H) 0.44 - 1.00 mg/dL   Glucose, Bld 536 (H) 70 - 99 mg/dL    Comment: Glucose reference range applies only to samples taken after fasting for at least 8 hours.   Calcium, Ion 1.22 1.15 - 1.40 mmol/L   TCO2 23 22 - 32 mmol/L   Hemoglobin 13.9 12.0 - 15.0 g/dL   HCT 64.4 03.4 - 74.2 %  I-Stat Lactic Acid, ED     Status: Abnormal  Collection Time: 07/18/23 11:18 PM  Result Value Ref Range   Lactic Acid, Venous 3.7 (HH) 0.5 - 1.9 mmol/L   Comment NOTIFIED PHYSICIAN   Blood Culture (routine x 2)     Status: None (Preliminary result)   Collection Time: 07/18/23 11:32 PM   Specimen: BLOOD LEFT FOREARM  Result Value Ref Range   Specimen Description BLOOD LEFT FOREARM    Special Requests      BOTTLES DRAWN AEROBIC AND ANAEROBIC Blood Culture adequate volume   Culture      NO GROWTH < 12 HOURS Performed at [facility],  [address], [place], [place] [address]    Report Status PENDING   Blood Culture (routine x 2)     Status: None (Preliminary result)   Collection Time: 07/18/23 11:37 PM   Specimen: BLOOD  Result Value Ref Range   Specimen Description BLOOD RIGHT ANTECUBITAL    Special Requests      BOTTLES DRAWN AEROBIC AND ANAEROBIC Blood Culture adequate volume   Culture      NO GROWTH < 12 HOURS Performed at [facility], [address], [place], [place] [address]    Report Status PENDING   Troponin I (High Sensitivity)     Status: None   Collection Time: 07/19/23  1:00 AM  Result Value Ref Range   Troponin I (High Sensitivity) 7 <18 ng/L    Comment: (NOTE) Elevated high sensitivity troponin I (hsTnI) values and significant  changes across serial measurements may suggest ACS but many other  chronic and acute conditions are known to elevate hsTnI results.  Refer to the "Links" section for chest pain algorithms and additional  guidance. Performed at [facility], [address], [place], [place] [address]   Resp panel by RT-PCR (RSV, Flu A&B, Covid) Anterior Nasal Swab     Status: None   Collection Time: 07/19/23  1:00 AM   Specimen: Anterior Nasal Swab  Result Value Ref Range   SARS Coronavirus 2 by RT PCR NEGATIVE NEGATIVE   Influenza A by PCR NEGATIVE NEGATIVE   Influenza B by PCR NEGATIVE NEGATIVE    Comment: (NOTE) The Xpert Xpress SARS-CoV-2/FLU/RSV plus assay is intended as an aid in the diagnosis of influenza from Nasopharyngeal swab specimens and should not be used as a sole basis for treatment. Nasal washings and aspirates are unacceptable for Xpert Xpress SARS-CoV-2/FLU/RSV testing.  Fact Sheet for Patients: BloggerCourse.com  Fact Sheet for Healthcare Providers: SeriousBroker.it  This test is not yet approved or cleared by the [place] FDA and has been authorized for detection and/or diagnosis of SARS-CoV-2  by FDA under an Emergency Use Authorization (EUA). This EUA will remain in effect (meaning this test can be used) for the duration of the COVID-19 declaration under Section 564(b)(1) of the Act, 21 U.S.C. section 360bbb-3(b)(1), unless the authorization is terminated or revoked.     Resp Syncytial Virus by PCR NEGATIVE NEGATIVE    Comment: (NOTE) Fact Sheet for Patients: BloggerCourse.com  Fact Sheet for Healthcare Providers: SeriousBroker.it  This test is not yet approved or cleared by the [place] FDA and has been authorized for detection and/or diagnosis of SARS-CoV-2 by FDA under an Emergency Use Authorization (EUA). This EUA will remain in effect (meaning this test can be used) for the duration of the COVID-19 declaration under Section 564(b)(1) of the Act, 21 U.S.C. section 360bbb-3(b)(1), unless the authorization is terminated or revoked.  Performed at [facility],  [address] [place], [place] [address]   I-Stat Lactic Acid, ED     Status: Abnormal   Collection Time: 07/19/23  1:09 AM  Result Value Ref Range   Lactic Acid, Venous 2.5 (HH) 0.5 - 1.9 mmol/L   Comment NOTIFIED PHYSICIAN   CBG monitoring, ED     Status: Abnormal   Collection Time: 07/19/23  8:22 AM  Result Value Ref Range   Glucose-Capillary 192 (H) 70 - 99 mg/dL    Comment: Glucose reference range applies only to samples taken after fasting for at least 8 hours.  I-Stat CG4 Lactic Acid     Status: None   Collection Time: 07/19/23  9:21 AM  Result Value Ref Range   Lactic Acid, Venous 1.0 0.5 - 1.9 mmol/L  CBG monitoring, ED     Status: Abnormal   Collection Time: 07/19/23 12:14 PM  Result Value Ref Range   Glucose-Capillary 210 (H) 70 - 99 mg/dL    Comment: Glucose reference range applies only to samples taken after fasting for at least 8 hours.   *Note: Due to a large number of results and/or encounters for the requested time period,  some results have not been displayed. A complete set of results can be found in Results Review.   DG Chest Port 1 View  Result Date: 07/18/2023 CLINICAL DATA:  Questionable sepsis EXAM: PORTABLE CHEST 1 VIEW COMPARISON:  Chest x-ray 07/17/2023 FINDINGS: Tracheostomy is unchanged in position. Lung volumes are low likely accentuating central pulmonary vascularity. The heart is mildly enlarged. There is no lung consolidation, pleural effusion or pneumothorax. No acute fractures are seen. IMPRESSION: Low lung volumes likely accentuating central pulmonary vascularity. No lung consolidation. Electronically Signed   By: Darliss Cheney M.D.   On: 07/18/2023 23:22   CT Angio Chest Pulmonary Embolism (PE) W or WO Contrast  Result Date: 07/17/2023 CLINICAL DATA:  High probability for PE EXAM: CT ANGIOGRAPHY CHEST WITH CONTRAST TECHNIQUE: Multidetector CT imaging of the chest was performed using the standard protocol during bolus administration of intravenous contrast. Multiplanar CT image reconstructions and MIPs were obtained to evaluate the vascular anatomy. RADIATION DOSE REDUCTION: This exam was performed according to the departmental dose-optimization program which includes automated exposure control, adjustment of the mA and/or kV according to patient size and/or use of iterative reconstruction technique. CONTRAST:  75mL OMNIPAQUE IOHEXOL 350 MG/ML SOLN COMPARISON:  CT angiogram chest abdomen and pelvis 03/10/2023 FINDINGS: Cardiovascular: Satisfactory opacification of the pulmonary arteries to the segmental level. No evidence of pulmonary embolism. Normal heart size. No pericardial effusion. Mediastinum/Nodes: No enlarged mediastinal, hilar, or axillary lymph nodes. Thyroid gland, trachea, and esophagus demonstrate no significant abnormality. Tracheostomy in place. Lungs/Pleura: Lungs are clear. No pleural effusion or pneumothorax. Upper Abdomen: No acute abnormality. Musculoskeletal: No chest wall abnormality.  No acute or significant osseous findings. Review of the MIP images confirms the above findings. IMPRESSION: 1. No evidence for pulmonary embolism or other acute intrathoracic process. 2. Tracheostomy in place. Electronically Signed   By: Darliss Cheney M.D.   On: 07/17/2023 20:55   CT Head Wo Contrast  Result Date: 07/17/2023 CLINICAL DATA:  Head trauma, moderate-severe; Neck trauma, dangerous injury mechanism (Age 86-64y) EXAM: CT HEAD WITHOUT CONTRAST CT CERVICAL SPINE WITHOUT CONTRAST TECHNIQUE: Multidetector CT imaging of the head and cervical spine was performed following the standard protocol without intravenous contrast. Multiplanar CT image reconstructions of the cervical spine were also generated. RADIATION DOSE REDUCTION: This exam was performed according to the departmental  dose-optimization program which includes automated exposure control, adjustment of the mA and/or kV according to patient size and/or use of iterative reconstruction technique. COMPARISON:  None Available. FINDINGS: CT HEAD FINDINGS Brain: No evidence of acute infarction, hemorrhage, hydrocephalus, extra-axial collection or mass lesion/mass effect. Partially empty sella. Vascular: No hyperdense vessel. Skull: No acute fracture. Sinuses/Orbits: Clear sinuses.  No acute orbital findings. Other: No mastoid effusions. CT CERVICAL SPINE FINDINGS Alignment: Straightening.  No substantial sagittal subluxation. Skull base and vertebrae: No acute fracture. No primary bone lesion or focal pathologic process. Soft tissues and spinal canal: No prevertebral fluid or swelling. No visible canal hematoma. Disc levels:  No significant bony degenerative change. Upper chest: Visualized lung apices are clear. IMPRESSION: No evidence of acute abnormality intracranially or in the cervical spine. Electronically Signed   By: Feliberto Harts M.D.   On: 07/17/2023 17:11   CT Cervical Spine Wo Contrast  Result Date: 07/17/2023 CLINICAL DATA:  Head  trauma, moderate-severe; Neck trauma, dangerous injury mechanism (Age 32-64y) EXAM: CT HEAD WITHOUT CONTRAST CT CERVICAL SPINE WITHOUT CONTRAST TECHNIQUE: Multidetector CT imaging of the head and cervical spine was performed following the standard protocol without intravenous contrast. Multiplanar CT image reconstructions of the cervical spine were also generated. RADIATION DOSE REDUCTION: This exam was performed according to the departmental dose-optimization program which includes automated exposure control, adjustment of the mA and/or kV according to patient size and/or use of iterative reconstruction technique. COMPARISON:  None Available. FINDINGS: CT HEAD FINDINGS Brain: No evidence of acute infarction, hemorrhage, hydrocephalus, extra-axial collection or mass lesion/mass effect. Partially empty sella. Vascular: No hyperdense vessel. Skull: No acute fracture. Sinuses/Orbits: Clear sinuses.  No acute orbital findings. Other: No mastoid effusions. CT CERVICAL SPINE FINDINGS Alignment: Straightening.  No substantial sagittal subluxation. Skull base and vertebrae: No acute fracture. No primary bone lesion or focal pathologic process. Soft tissues and spinal canal: No prevertebral fluid or swelling. No visible canal hematoma. Disc levels:  No significant bony degenerative change. Upper chest: Visualized lung apices are clear. IMPRESSION: No evidence of acute abnormality intracranially or in the cervical spine. Electronically Signed   By: Feliberto Harts M.D.   On: 07/17/2023 17:11   DG Chest 2 View  Result Date: 07/17/2023 CLINICAL DATA:  Shortness of breath. EXAM: CHEST - 2 VIEW COMPARISON:  01/28/2023. FINDINGS: Low lung volume. Again seen is elevated right hemidiaphragm. Bilateral lung fields are clear. Bilateral costophrenic angles are clear. Normal cardio-mediastinal silhouette. No acute osseous abnormalities. The soft tissues are within normal limits. Tracheostomy tube seen with its tip approximately  6.3 cm above the carina. There are surgical clips in the right upper quadrant, typical of a previous cholecystectomy. IMPRESSION: *No active cardiopulmonary disease. Electronically Signed   By: Jules Schick M.D.   On: 07/17/2023 16:55    Pending Labs Unresulted Labs (From admission, onward)     Start     Ordered   07/20/23 0500  Basic metabolic panel  Tomorrow morning,   R        07/19/23 0548   07/20/23 0500  CBC  Tomorrow morning,   R        07/19/23 0548   07/19/23 0908  Culture, Respiratory w Gram Stain  Once,   R        07/19/23 0907   07/19/23 0907  Procalcitonin  Add-on,   AD       References:    Procalcitonin Lower Respiratory Tract Infection AND Sepsis Procalcitonin Algorithm   07/19/23 2130  07/19/23 0907  Strep pneumoniae urinary antigen  Once,   R        07/19/23 0907   07/19/23 0907  Legionella Pneumophila Serogp 1 Ur Ag  Once,   R        07/19/23 0907   07/19/23 0614  Magnesium  Add-on,   AD        07/19/23 1610   07/19/23 0613  Urinalysis, w/ Reflex to Culture (Infection Suspected) -Urine, Clean Catch  (Urine Labs)  Once,   R       Question:  Specimen Source  Answer:  Urine, Clean Catch   07/19/23 0612   07/18/23 2309  Expectorated Sputum Assessment w Gram Stain, Rflx to Resp Cult  (Undifferentiated presentation (screening labs and basic nursing orders))  ONCE - URGENT,   URGENT        07/18/23 2309            Vitals/Pain Today's Vitals   07/19/23 0845 07/19/23 1119 07/19/23 1200 07/19/23 1242  BP: 109/66  (!) 119/56   Pulse: (!) 125  (!) 128   Resp: 20  20   Temp:    97.8 F (36.6 C)  TempSrc:    Oral  SpO2: 100%  100%   Weight:      Height:      PainSc:  7       Isolation Precautions No active isolations  Medications Medications  enoxaparin (LOVENOX) injection 55 mg (55 mg Subcutaneous Not Given 07/19/23 1015)  insulin aspart (novoLOG) injection 0-15 Units (5 Units Subcutaneous Given 07/19/23 1235)  insulin glargine-yfgn (SEMGLEE) injection  15 Units (15 Units Subcutaneous Given 07/19/23 1013)  gabapentin (NEURONTIN) capsule 300 mg (300 mg Oral Not Given 07/19/23 0919)  cefTRIAXone (ROCEPHIN) 1 g in sodium chloride 0.9 % 100 mL IVPB (0 g Intravenous Stopped 07/19/23 1111)  guaiFENesin (ROBITUSSIN) 100 MG/5ML liquid 5 mL (has no administration in time range)  methylPREDNISolone sodium succinate (SOLU-MEDROL) 40 mg/mL injection 40 mg (40 mg Intravenous Given 07/19/23 1011)  azithromycin (ZITHROMAX) 500 mg in sodium chloride 0.9 % 250 mL IVPB (0 mg Intravenous Stopped 07/19/23 0919)  sodium chloride HYPERTONIC 3 % nebulizer solution 4 mL (4 mLs Nebulization Given 07/19/23 1013)  arformoterol (BROVANA) nebulizer solution 15 mcg (15 mcg Nebulization Not Given 07/19/23 1151)  revefenacin (YUPELRI) nebulizer solution 175 mcg (175 mcg Nebulization Given 07/19/23 1031)  budesonide (PULMICORT) nebulizer solution 0.25 mg (0.25 mg Nebulization Not Given 07/19/23 1151)  morphine (PF) 4 MG/ML injection 4 mg (4 mg Intravenous Given 07/19/23 0032)  ondansetron (ZOFRAN) injection 4 mg (4 mg Intravenous Given 07/19/23 0029)  ceFEPIme (MAXIPIME) 2 g in sodium chloride 0.9 % 100 mL IVPB (0 g Intravenous Stopped 07/19/23 0246)  vancomycin (VANCOREADY) IVPB 2000 mg/400 mL (0 mg Intravenous Stopped 07/19/23 0418)  morphine (PF) 4 MG/ML injection 4 mg (4 mg Intravenous Given 07/19/23 0311)  lactated ringers bolus 500 mL (0 mLs Intravenous Stopped 07/19/23 0756)  HYDROmorphone (DILAUDID) injection 1 mg (1 mg Intravenous Given 07/19/23 0809)  LORazepam (ATIVAN) injection 0.5 mg (0.5 mg Intravenous Given 07/19/23 1023)  HYDROmorphone (DILAUDID) injection 1 mg (1 mg Intravenous Given 07/19/23 1234)    Mobility Didn't ambulate in ED     Focused Assessments Pulmonary Assessment Handoff:  Lung sounds: Bilateral Breath Sounds: Diminished, Expiratory wheezes L Breath Sounds: Fine crackles, Rhonchi R Breath Sounds: Fine crackles, Rhonchi O2 Device:  Tracheostomy Collar O2 Flow Rate (L/min): 6 L/min    R Recommendations: See Admitting Provider  Note  Report given to:   Additional Notes: trach in place. Writes or covers trach to communicate

## 2023-07-19 NOTE — Progress Notes (Signed)
Subjective:  Introduction: [name] is a 52 year old female with PMHx of HTN, HLD, HFpEF (LVEF 50%), OSA, severe persistent asthma, subglottic stenosis w/ tracheostomy on 2L QHS, chronic lower back pain, Insulin-Dependent Diabetes Mellitus and gastroparesis who presents for shortness of breath, cough, and increased sputum production for a few days. She states that she has had a fever of 101, chills, dysuria and decreased urine output recently, and has had 4 syncopal episodes recently.  Update since admission: Patient is in discomfort at the neck and upper chest. After being given medications, her shortness of breath has improved however her throat still hurts. She is able to converse when covering the new, cuffless trach tube.  Objective:  Vital signs in last 24 hours: Vitals:   07/19/23 0600 07/19/23 0615 07/19/23 0739 07/19/23 0845  BP: 132/85 101/62  109/66  Pulse: (!) 107 (!) 113  (!) 125  Resp:    20  Temp:   98.2 F (36.8 C)   TempSrc:   Oral   SpO2: 100% 98%  100%  Weight:      Height:       Weight change:   Intake/Output Summary (Last 24 hours) at 07/19/2023 1109 Last data filed at 07/19/2023 0756 Gross per 24 hour  Intake 1000 ml  Output --  Net 1000 ml    Physical Exam Constitutional:      Appearance: She is not diaphoretic.     Comments: Appears to be in pain  HENT:     Head: Normocephalic.     Nose:     Comments: External nose appears normal Neck:     Comments: Trach tube in place with gauze around it Cardiovascular:     Rate and Rhythm: Tachycardia present.     Heart sounds: Normal heart sounds.  Pulmonary:     Breath sounds: No stridor. No wheezing.     Comments: On 6L blowby oxygen to trach tube. Coughing with visible sputum being cleared through the tube. Only auscultated anterior chest as examination is limited due to patient discomfort with movement. Musculoskeletal:        General: Normal range of motion.     Comments: Able to  move in the bed to adjust position  Skin:    General: Skin is warm and dry.  Neurological:     Mental Status: She is alert and oriented to person, place, and time.  Psychiatric:     Comments: Tears from eyes secondary to pain of the neck    Assessment/Plan:  Principal Problem:   Asthma, chronic obstructive, with acute exacerbation (HCC) Active Problems:   OSA (obstructive sleep apnea)   Chronic pain syndrome   Tracheal stenosis   Tracheostomy dependent (HCC)  [name] is a 52 year old female with PMHx of HTN, HLD, HFpEF (LVEF 50%), OSA, severe persistent asthma, subglottic  stenosis w/ tracheostomy on 2L QHS, chronic lower back pain, Insulin-Dependent Diabetes Mellitus and gastroparesis who presents for shortness of breath, cough, and increased sputum production for a few days. She states that she has had a fever of 101, chills, dysuria and decreased urine output recently, and has had 4 syncopal episodes recently.  #Asthma Exacerbation +/- CAP #Acute on Chronic Respiratory Failure #Respiratory Alkalosis w/ Lactic Acidosis Baseline trach dependence secondary to tracheal stenosis. S/p ventilation over night. Anion gap of 18, Lactate initially 3.7, normal at this time. pH 7.46, pCO2 low at 32.3 consistent with Respiratory alkalosis secondary to respiratory failure w/ concomitant lactic acidosis.  Clinically improved at this time and trach was changed back to Shiley cuffless #4 per [facility] recommendations. BNP and serial troponins negative which makes heart failure exacerbation or ACS less likely. CXR negative for edema, effusions, or consolidations. Covid and flu A/B negative. S/p 1x Vancomycin and cefepime in ED. Azithromycin discontinued. Continue IV Ceftriaxone although less likely bacterial etiology. -IV Solu-medrol 40 mg daily -IV Ceftriaxone 2g daily -Nebulizers: LABA/LAMA/ICS q4h scheduled, hypertonic saline daily -AM CBC, BMP -Pending: Sputum gram stain/culture,  Procalcitonin, Strep pneumo & Legionella urinary antigen  #Acute Kidney Injury Cr 1.88 on admission (baseline Cr: ~1.2). Suspect pre-renal etiology 2/2 to poor intake recently. S/p 0.5L of LR, f/u repeat Cr 2.0. Recently had dysuria, subjective decreased urine output. Holding Lisinopril and Lasix. Lactate is normal now. Urine was bland at 10 am. Giving next 500 mL of LR. If Cr is not better tomorrow, will order RBUS. - Blood cultures NGTD <12 hours - Pending Urinalysis w/ reflex culture - Strict I/Os, monitor UOP - Morning BMP, Magnesium  #Type 1 Diabetes Mellitus Glucose 267 in the setting of steroids. Home regimen: Tresiba 20 units in the morning. Will defer urinary ketones as the patient does not have a primary acidosis, nor does she have lack of insulin or symptoms of DKA. Adjust long acting insulin according to 24 hour blood sugar. - Semglee decreased to 15 units - Meal time sliding scale insulin aspart (novolog)  Chronic Problems: Dizziness: Patient has been followed by PCP for months for her dizzy spells. They were considering Amitriptyline as a cause. HFpEF, HTN: BNP negative, CXR negative for edema. Hold Spironolactone 25 mg, Amlodipine 10 mg, lisinopril 40 mg, and PRN Lasix 40 mg as her blood pressure is normal w/ AKI. Hyperlipidemia: Continue home Rosuvastatin 20 mg Chronic pain: Continue Percocet 10-325mg  q8 prn. Hold Amtriptyline 10, Tizanidine 4 mg TID Depression: Continue Bupropion 150 BID, Sertraline 100 Anxiety: Hold Atarax 25, continue Gabapentin 600 mg TID GERD: Hold Pantoprazole 40  Diet: NPO until Speech Language Pathologist evaluation IVF: s/p 0.5L LR VTE: Enoxaparin Code: Full PT/OT recs: Pending   LOS: 0 days   Kayleen Memos, Medical Student 07/19/2023, 11:09 AM

## 2023-07-20 DIAGNOSIS — J441 Chronic obstructive pulmonary disease with (acute) exacerbation: Secondary | ICD-10-CM | POA: Diagnosis not present

## 2023-07-20 LAB — BLOOD CULTURE ID PANEL (REFLEXED) - BCID2

## 2023-07-20 LAB — CBC
HCT: 33.2 % — ABNORMAL LOW (ref 36.0–46.0)
Hemoglobin: 10.7 g/dL — ABNORMAL LOW (ref 12.0–15.0)
MCH: 27.4 pg (ref 26.0–34.0)
MCHC: 32.2 g/dL (ref 30.0–36.0)
MCV: 85.1 fL (ref 80.0–100.0)
Platelets: 364 10*3/uL (ref 150–400)
RBC: 3.9 MIL/uL (ref 3.87–5.11)
RDW: 14.3 % (ref 11.5–15.5)
WBC: 13.1 10*3/uL — ABNORMAL HIGH (ref 4.0–10.5)
nRBC: 0 % (ref 0.0–0.2)

## 2023-07-20 LAB — GLUCOSE, CAPILLARY
Glucose-Capillary: 224 mg/dL — ABNORMAL HIGH (ref 70–99)
Glucose-Capillary: 181 mg/dL — ABNORMAL HIGH (ref 70–99)
Glucose-Capillary: 275 mg/dL — ABNORMAL HIGH (ref 70–99)
Glucose-Capillary: 373 mg/dL — ABNORMAL HIGH (ref 70–99)

## 2023-07-20 LAB — STREP PNEUMONIAE URINARY ANTIGEN: Strep Pneumo Urinary Antigen: NEGATIVE

## 2023-07-20 LAB — BASIC METABOLIC PANEL
Anion gap: 10 (ref 5–15)
BUN: 17 mg/dL (ref 6–20)
CO2: 24 mmol/L (ref 22–32)
Calcium: 9.7 mg/dL (ref 8.9–10.3)
Chloride: 102 mmol/L (ref 98–111)
Creatinine, Ser: 1.15 mg/dL — ABNORMAL HIGH (ref 0.44–1.00)
GFR, Estimated: 57 mL/min — ABNORMAL LOW (ref >60)
Glucose, Bld: 252 mg/dL — ABNORMAL HIGH (ref 70–99)
Potassium: 4.3 mmol/L (ref 3.5–5.1)
Sodium: 136 mmol/L (ref 135–145)

## 2023-07-20 LAB — MAGNESIUM: Magnesium: 2 mg/dL (ref 1.7–2.4)

## 2023-07-20 MED ORDER — ONDANSETRON HCL 4 MG/2ML IJ SOLN: 4.0000 mg | Freq: Four times a day (QID) | INTRAMUSCULAR | Status: DC

## 2023-07-20 MED ORDER — CETIRIZINE HCL 5 MG/5ML PO SOLN: 10.0000 mg | Freq: Every day | ORAL | Status: DC

## 2023-07-20 MED ORDER — LORATADINE 10 MG PO TABS
10.0000 mg | ORAL_TABLET | Freq: Every day | ORAL | Status: DC
  Administered 2023-07-20: 10 mg via ORAL
  Filled 2023-07-20: qty 1

## 2023-07-20 MED ORDER — INSULIN ASPART 100 UNIT/ML IJ SOLN
0.0000 [IU] | Freq: Three times a day (TID) | INTRAMUSCULAR | Status: DC
  Administered 2023-07-21: 5 [IU] via SUBCUTANEOUS
  Administered 2023-07-21: 8 [IU] via SUBCUTANEOUS

## 2023-07-20 MED ORDER — INSULIN GLARGINE-YFGN 100 UNIT/ML ~~LOC~~ SOLN
18.0000 [IU] | Freq: Every day | SUBCUTANEOUS | Status: DC
  Administered 2023-07-20: 18 [IU] via SUBCUTANEOUS
  Filled 2023-07-20 (×2): qty 0.18

## 2023-07-20 MED ORDER — INSULIN ASPART 100 UNIT/ML IJ SOLN
0.0000 [IU] | Freq: Every day | INTRAMUSCULAR | Status: DC
  Administered 2023-07-20: 5 [IU] via SUBCUTANEOUS

## 2023-07-20 MED ORDER — AMLODIPINE BESYLATE 10 MG PO TABS
10.0000 mg | ORAL_TABLET | Freq: Every day | ORAL | Status: DC
  Administered 2023-07-20 – 2023-07-21 (×2): 10 mg via ORAL
  Filled 2023-07-20 (×2): qty 1

## 2023-07-20 MED ORDER — ONDANSETRON HCL 4 MG/2ML IJ SOLN
4.0000 mg | Freq: Four times a day (QID) | INTRAMUSCULAR | Status: DC
  Administered 2023-07-20 – 2023-07-21 (×3): 4 mg via INTRAVENOUS
  Filled 2023-07-20 (×3): qty 2

## 2023-07-20 MED ORDER — HYDROMORPHONE HCL 1 MG/ML IJ SOLN
0.5000 mg | INTRAMUSCULAR | Status: DC | PRN
  Administered 2023-07-20 – 2023-07-21 (×4): 0.5 mg via INTRAVENOUS
  Filled 2023-07-20 (×4): qty 0.5

## 2023-07-20 MED ORDER — SPIRONOLACTONE 25 MG PO TABS
25.0000 mg | ORAL_TABLET | Freq: Every day | ORAL | Status: DC
  Administered 2023-07-20 – 2023-07-21 (×2): 25 mg via ORAL
  Filled 2023-07-20 (×2): qty 1

## 2023-07-20 MED ORDER — ONDANSETRON HCL 4 MG/2ML IJ SOLN
4.0000 mg | Freq: Four times a day (QID) | INTRAMUSCULAR | Status: DC | PRN
  Administered 2023-07-20: 4 mg via INTRAVENOUS
  Filled 2023-07-20: qty 2

## 2023-07-20 NOTE — Progress Notes (Signed)
Physical Therapy Evaluation Patient Details Name: [name] MRN: [id number] DOB: 1971-04-28 Today's Date: 07/20/2023  History of Present Illness  Pt is a 52 y.o. female presenting 10/25 with syncopal episodes at home and chest pain. PMH significant for asthma, OSA, chronic pain syndrome, tracheal stenosis with tracheostomy dependence, DM2, COPD, depression, HTN and lumbar fusion.  Clinical Impression  Patient presents with mobility limited due to fatigue and generalized weakness.  Reports recent syncope at home x 4 hitting her head x 2.  States now not getting up without spouse or her aide there for past couple weeks.  Prior to that ambulatory in the home with her cane.  Currently standing for orthostatics today and declining ambulation due to fatigue.  Feel she will benefit from skilled PT in the acute setting, though she is declining follow up PT at d/c.    Orthostatic VS for the past 24 hrs (Last 3 readings):  BP- Lying Pulse- Lying BP- Sitting Pulse- Sitting BP- Standing at 0 minutes Pulse- Standing at 0 minutes BP- Standing at 3 minutes Pulse- Standing at 3 minutes  07/20/23 1210 (!) 140/91 103 147/90 110 (!) 133/98 106 (!) 143/92 108           If plan is discharge home, recommend the following: A little help with walking and/or transfers;A little help with bathing/dressing/bathroom;Help with stairs or ramp for entrance;Assist for transportation;Assistance with cooking/housework   Can travel by private vehicle        Equipment Recommendations Hospital bed  Recommendations for Other Services       Functional Status Assessment Patient has had a recent decline in their functional status and demonstrates the ability to make significant improvements in function in a reasonable and predictable amount of time.     Precautions / Restrictions Precautions Precautions: Fall Precaution Comments: recent syncope at home x 4 Restrictions Weight Bearing Restrictions: No       Mobility  Bed Mobility Overal bed mobility: Modified Independent                  Transfers Overall transfer level: Needs assistance   Transfers: Sit to/from Stand Sit to Stand: Supervision           General transfer comment: assist for safety and lines    Ambulation/Gait Ambulation/Gait assistance: Contact guard assist Gait Distance (Feet): 2 Feet Assistive device: None Gait Pattern/deviations: Step-to pattern       General Gait Details: stepping up to [facility]; declined further ambulation due to fatigue  Stairs            Wheelchair Mobility     Tilt Bed    Modified Rankin (Stroke Patients Only)       Balance Overall balance assessment: Needs assistance   Sitting balance-[name]: Good     Standing balance support: No upper extremity supported Standing balance-[name]: Fair                               Pertinent Vitals/Pain Pain Assessment Pain Assessment: Faces Faces Pain Scale: Hurts little more Pain Location: generalized Pain Descriptors / Indicators: Discomfort, Grimacing Pain Intervention(s): Monitored during session, Limited activity within patient's tolerance    Home Living Family/patient expects to be discharged to:: Private residence Living Arrangements: Spouse/significant other Available Help at Discharge: Family;Personal care attendant;Available 24 hours/day Type of Home: House Home Access: Ramped entrance     Alternate Level Stairs-Number of Steps: flight Home Layout:  Two level Home Equipment: Agricultural consultant (2 wheels);Shower seat;BSC/3in1;Other (comment) Additional Comments: has aide 5 d/week 3 hours    Prior Function Prior Level of Function : Needs assist;History of Falls (last six months)             Mobility Comments: uses cane in the hom, falls x2 with syncope recently       Extremity/Trunk Assessment   Upper Extremity Assessment Upper Extremity Assessment: Overall WFL for  tasks assessed    Lower Extremity Assessment Lower Extremity Assessment: Overall WFL for tasks assessed       Communication   Communication Communication: Tracheostomy (speaking over trach this session)  Cognition Arousal: Alert Behavior During Therapy: WFL for tasks assessed/performed Overall Cognitive Status: Within Functional Limits for tasks assessed                                          General Comments General comments (skin integrity, edema, etc.): standing for orthostatics, on 28% trach collar with 100% SpO2; orthostatics in flowsheet    Exercises     Assessment/Plan    PT Assessment Patient needs continued PT services  PT Problem List Decreased activity tolerance;Decreased strength;Decreased mobility       PT Treatment Interventions Gait training;Stair training;Therapeutic exercise;Therapeutic activities;Functional mobility training;Balance training;Patient/family education    PT Goals (Current goals can be found in the Care Plan section)  Acute Rehab PT Goals Patient Stated Goal: to return home PT Goal Formulation: With patient Time For Goal Achievement: 08/03/23 Potential to Achieve Goals: Good    Frequency Min 1X/week     Co-evaluation               AM-PAC PT "6 Clicks" Mobility  Outcome Measure Help needed turning from your back to your side while in a flat bed without using bedrails?: None Help needed moving from lying on your back to sitting on the side of a flat bed without using bedrails?: A Little Help needed moving to and from a bed to a chair (including a wheelchair)?: A Little Help needed standing up from a chair using your arms (e.g., wheelchair or bedside chair)?: A Little Help needed to walk in hospital room?: Total Help needed climbing 3-5 steps with a railing? : Total 6 Click Score: 15    End of Session Equipment Utilized During Treatment: Oxygen Activity Tolerance: Patient limited by fatigue Patient left:  in bed;with call bell/phone within reach   PT Visit Diagnosis: Other abnormalities of gait and mobility (R26.89);Muscle weakness (generalized) (M62.81)    Time: 9147-8295 PT Time Calculation (min) (ACUTE ONLY): 21 min   Charges:   PT Evaluation $PT Eval Moderate Complexity: 1 Mod   PT General Charges $$ ACUTE PT VISIT: 1 Visit         Sheran Lawless, PT Acute Rehabilitation Services Office:[phone] 07/20/2023   Elray Mcgregor 07/20/2023, 12:36 PM

## 2023-07-20 NOTE — Evaluation (Signed)
Occupational Therapy Evaluation Patient Details Name: [name] MRN: [id number] DOB: May 16, 1971 Today's Date: 07/20/2023   History of Present Illness Pt is a 52 y.o. female presenting 10/25 with syncopal episodes at home and chest pain. PMH significant for asthma, OSA, chronic pain syndrome, tracheal stenosis with tracheostomy dependence, DM2, COPD, depression, HTN and lumbar fusion.   Clinical Impression   PTA, pt lived with her husband and had an aide for 3 hours 5 days per week who assisted with LB ADL, bathing, and recently mobility secondary to recent syncopal episodes. Pt performing UB ADL with mod A and LB ADL with up to max A. Pt able to perform STS transfer this session; observed to become light headed with change in color and need to sit. 30 point systolic drop in BP noted. Recommending discharge home with HHOT for initial home safety eval.       If plan is discharge home, recommend the following: A little help with walking and/or transfers;A lot of help with bathing/dressing/bathroom;Assistance with cooking/housework;Assist for transportation;Help with stairs or ramp for entrance    Functional Status Assessment  Patient has had a recent decline in their functional status and demonstrates the ability to make significant improvements in function in a reasonable and predictable amount of time.  Equipment Recommendations  None recommended by OT    Recommendations for Other Services       Precautions / Restrictions Precautions Precautions: Fall Precaution Comments: recent syncope at home x 4      Mobility Bed Mobility Overal bed mobility: Modified Independent                  Transfers Overall transfer level: Needs assistance   Transfers: Sit to/from Stand Sit to Stand: Supervision           General transfer comment: assist for safety and lines      Balance Overall balance assessment: Needs assistance   Sitting balance-[name]:  Good     Standing balance support: Bilateral upper extremity supported Standing balance-[name]: Fair                             ADL either performed or assessed with clinical judgement   ADL Overall ADL's : Needs assistance/impaired Eating/Feeding: Set up;Bed level   Grooming: Set up;Sitting   Upper Body Bathing: Set up;Sitting   Lower Body Bathing: Moderate assistance;Sit to/from stand   Upper Body Dressing : Set up;Sitting   Lower Body Dressing: Moderate assistance;Sit to/from stand   Toilet Transfer: Contact guard assist;Stand-pivot;Rolling walker (2 wheels)                   Vision Baseline Vision/History: 1 Wears glasses Ability to See in Adequate Light: 0 Adequate Patient Visual Report: No change from baseline Vision Assessment?: No apparent visual deficits     Perception Perception: Not tested       Praxis Praxis: Not tested       Pertinent Vitals/Pain Pain Assessment Pain Assessment: Faces Faces Pain Scale: Hurts little more Pain Location: generalized Pain Descriptors / Indicators: Discomfort, Grimacing Pain Intervention(s): Monitored during session, Limited activity within patient's tolerance     Extremity/Trunk Assessment Upper Extremity Assessment Upper Extremity Assessment: Overall WFL for tasks assessed   Lower Extremity Assessment Lower Extremity Assessment: Overall WFL for tasks assessed       Communication Communication Communication: Tracheostomy (speaking over trach)   Cognition Arousal: Alert Behavior During Therapy: [facility]  for tasks assessed/performed Overall Cognitive Status: Within Functional Limits for tasks assessed                                       General Comments  standing for orthostatics, on 28% trach collar with 100% SpO2. BP 145/93 (105) seated EOB; 141/92 standing EOB; Pt becoming dizzy in standing and needing to lie down; BP 110/57 supine.    Exercises     Shoulder  Instructions      Home Living Family/patient expects to be discharged to:: Private residence Living Arrangements: Spouse/significant other Available Help at Discharge: Family;Personal care attendant;Available 24 hours/day Type of Home: House Home Access: Ramped entrance     Home Layout: Two level Alternate Level Stairs-Number of Steps: flight Alternate Level Stairs-Rails: Right Bathroom Shower/Tub: Walk-in shower;Door   Bathroom Toilet: Handicapped height     Home Equipment: Agricultural consultant (2 wheels);Shower seat;BSC/3in1;Other (comment)   Additional Comments: has aide 5 d/week 3 hours      Prior Functioning/Environment Prior Level of Function : Needs assist;History of Falls (last six months)             Mobility Comments: uses cane in the hom, falls x2 with syncope recently ADLs Comments: Pt gets assist with bathing and dressing lower body. Pt can feed self, groom and toilet without assist at baseline.        OT Problem List: Decreased strength;Decreased activity tolerance;Impaired balance (sitting and/or standing);Cardiopulmonary status limiting activity      OT Treatment/Interventions: Self-care/ADL training;Therapeutic exercise;DME and/or AE instruction;Balance training;Patient/family education;Therapeutic activities    OT Goals(Current goals can be found in the care plan section) Acute Rehab OT Goals Patient Stated Goal: get better OT Goal Formulation: With patient Time For Goal Achievement: 08/03/23 Potential to Achieve Goals: Good  OT Frequency: Min 1X/week    Co-evaluation              AM-PAC OT "6 Clicks" Daily Activity     Outcome Measure Help from another person eating meals?: None Help from another person taking care of personal grooming?: A Little Help from another person toileting, which includes using toliet, bedpan, or urinal?: A Little Help from another person bathing (including washing, rinsing, drying)?: A Little Help from another person  to put on and taking off regular upper body clothing?: A Little Help from another person to put on and taking off regular lower body clothing?: A Lot 6 Click Score: 18   End of Session Equipment Utilized During Treatment: Rolling walker (2 wheels) Nurse Communication: Mobility status  Activity Tolerance: Treatment limited secondary to medical complications (Comment) (orthostatic) Patient left: in bed;with call bell/phone within reach;with bed alarm set  OT Visit Diagnosis: Unsteadiness on feet (R26.81);Muscle weakness (generalized) (M62.81)                Time: 2952-8413 OT Time Calculation (min): 19 min Charges:  OT General Charges $OT Visit: 1 Visit OT Evaluation $OT Eval Moderate Complexity: 1 Mod  Tyler Deis, OTR/L [facility] Acute Rehabilitation Office: [phone]   Myrla Halsted 07/20/2023, 4:05 PM

## 2023-07-20 NOTE — Inpatient Diabetes Management (Signed)
Inpatient Diabetes Program Recommendations  AACE/ADA: New Consensus Statement on Inpatient Glycemic Control (2015)  Target Ranges:  Prepandial:   less than 140 mg/dL      Peak postprandial:   less than 180 mg/dL (1-2 hours)      Critically ill patients:  140 - 180 mg/dL   Lab Results  Component Value Date   GLUCAP 181 (H) 07/20/2023   HGBA1C 8.9 (H) 06/28/2023    Review of Glycemic Control  Latest Reference Range & Units 07/19/23 08:22 07/19/23 12:14 07/19/23 16:30 07/19/23 21:17 07/20/23 06:03 07/20/23 12:05  Glucose-Capillary 70 - 99 mg/dL 161 (H) 096 (H) 045 (H) 293 (H) 224 (H) 181 (H)   Diabetes history: DM 2 Outpatient Diabetes medications: Tresiba 20 units Daily, Humalog 10 units tid Current orders for Inpatient glycemic control:  Semglee 18 units Daily Novolog 0-15 units tid  A1c 8.9% on 06/28/2023 PO prednisone 40 mg Daily  Inpatient Diabetes Program Recommendations:    Note: Glucose trends increase after steroid and PO intake  -   Add Novolog 3 units tid meal coverage if eating >50% of meals -   Add Novolog hs scale  Thanks, Christena Deem RN, MSN, BC-ADM Inpatient Diabetes Coordinator Team Pager [phone] (8a-5p)

## 2023-07-20 NOTE — Progress Notes (Signed)
RT note. Sputum sample collected, RN aware, RN to send to lab. RT will continue to monitor.   07/20/23 0851  Therapy Vitals  Pulse Rate (!) 113  Resp 15  MEWS Score/Color  MEWS Score 2  MEWS Score Color Yellow  Respiratory Assessment  Assessment Type Mid-treatment  Respiratory Pattern Regular;Labored (Patient complcaing of pain at this time)  Chest Assessment Chest expansion symmetrical  Cough Non-productive;Strong  Sputum Amount Small  Sputum Color Tan  Sputum Consistency Thick  Sputum Specimen Source Spontaneous cough  Bilateral Breath Sounds Diminished  Oxygen Therapy/Pulse Ox  O2 Device Tracheostomy Collar  O2 Therapy Room air humidified  O2 Flow Rate (L/min) 6 L/min  FiO2 (%) 21 %  SpO2 100 %  Tracheostomy Shiley Flexible 4 mm Uncuffed  Placement Date/Time: 07/19/23 1033   Placed By: Self  Brand: Shiley Flexible  Size (mm): 4 mm  Style: Uncuffed  Status Secured with trach ties  Site Assessment Clean;Dry  Site Care Open to air  Inner Cannula Care  (assessed & clean)  Ties Assessment Clean, Dry  Tracheostomy Equipment at bedside Yes and checklist posted at head of bed  Respiratory  Airway LDA Tracheostomy

## 2023-07-20 NOTE — Progress Notes (Signed)
Pt complained of chest hurt this am.  Denied tingling, numbness in arm or jaw. Obtained EKG and vs. MD notified and aware.   Lawson Radar, RN

## 2023-07-20 NOTE — Progress Notes (Addendum)
Hospital Day 2 Subjective:  Introduction: [name] is a 52 year old female with PMHx of severe persistent asthma, subglottic stenosis w/ tracheostomy on 2L at bedtime, HTN, HLD, HFpEF (LVEF 50%), OSA, chronic lower back pain, Insulin-Dependent Diabetes Mellitus and gastroparesis who presents for shortness of breath, cough, and increased sputum production for a few days. She states that she has had a fever of 101, chills, dysuria and decreased urine output recently, and has had 4 syncopal episodes recently.  Interval: The patient says that she is improving, but still has shortness of breath. Her pain is primarily around the throat and is the same pain as her baseline chronic pain, but worse in severity. She notes having darkened urine and less output recently, but no burning urination or current symptoms. She has some nausea and pain with swallowing and requests liquid or IV medications for now. History somewhat limited due to patient's trach tube and ability to speak through this.  Objective:  Vital signs in last 24 hours: Vitals:   07/19/23 2311 07/19/23 2317 07/20/23 0302 07/20/23 0458  BP: (!) 149/94 (!) 149/94  (!) 140/78  Pulse: (!) 112 (!) 112 95 (!) 112  Resp: 16 16 (!) 21 20  Temp: 98 F (36.7 C)   98 F (36.7 C)  TempSrc: Oral   Oral  SpO2: 94% 97% 96% 100%  Weight:      Height:       Weight change:   Intake/Output Summary (Last 24 hours) at 07/20/2023 0650 Last data filed at 07/19/2023 1800 Gross per 24 hour  Intake 1345.58 ml  Output 600 ml  Net 745.58 ml    Physical Exam Constitutional:      Appearance: She is not diaphoretic.     Comments: Able to move around more than yesterday with less pain  HENT:     Head: Normocephalic.     Nose:     Comments: External nose appears normal Neck:     Comments: Trach tube in place with gauze around it Cardiovascular:     Rate and Rhythm: Tachycardia present.     Heart sounds: Normal heart sounds.   Pulmonary:     Breath sounds: No stridor. No wheezing.     Comments: On 6L blowby oxygen to trach tube. Only auscultated anterior chest as examination is limited due to patient discomfort with movement. Stertorous noise of breathing through trach tube audible to auscultation of anterior upper chest. Musculoskeletal:        General: Normal range of motion.     Comments: Able to move in the bed to adjust position  Skin:    General: Skin is warm and dry.  Neurological:     Mental Status: She is alert and oriented to person, place, and time.    Assessment/Plan:  Principal Problem:   Asthma, chronic obstructive, with acute exacerbation (HCC) Active Problems:   OSA (obstructive sleep apnea)   Chronic pain syndrome   Tracheal stenosis   AKI (acute kidney injury) (HCC)   Tracheostomy dependent (HCC)   Diabetes (HCC)   EKG 10/28: Patient's nurse obtained an EKG this morning. I reviewed it and it shows sinus tachycardia at a ventricular rate of 109 BPM with normal PR intervals, normal QRS duration, normal QTc length, normal R wave progression anteriorly, normal axis, and no signs of ST elevation/depression, or T wave inversions.  [name] is a 52 year old female with PMHx of severe persistent asthma, subglottic stenosis w/ tracheostomy on 2L  at bedtime, HTN, HLD, HFpEF (LVEF 50%), OSA, chronic lower back pain, Insulin-Dependent Diabetes Mellitus and gastroparesis who presents for shortness of breath, cough, and increased sputum production for a few days. She states that she has had a fever of 101, chills, dysuria and decreased urine output recently, and has had 4 syncopal episodes recently.  #Asthma Exacerbation +/- CAP #Acute on Chronic Respiratory Failure #Respiratory Alkalosis w/ Lactic Acidosis Baseline trach dependence secondary to tracheal stenosis. S/p ventilation over night. Anion gap of 18, Lactate initially 3.7, normal at this time. pH 7.46, pCO2 low at 32.3  consistent with Respiratory alkalosis secondary to respiratory failure w/ concomitant lactic acidosis. Clinically improved at this time and trach was changed back to Shiley cuffless #4 per [facility] recommendations. BNP and serial troponins negative which makes heart failure exacerbation or ACS less likely. CXR negative for edema, effusions, or consolidations. Covid and flu A/B negative. S/p 1x Vancomycin and cefepime in ED, Azithromycin discontinued.  10/28: Procalcitonin negative. WBC lower, although entire CBC lowered proportionally so I suspect dilution. WBC elevation is likely from steroids. Trying to transition to PO medications when pain is controlled and swallowing is more feasible. - IV Solu-medrol 40 mg daily - CEASE IV Ceftriaxone 2g daily - Nebulizers: LABA/LAMA/ICS q4h scheduled, hypertonic saline daily - AM CBC, BMP - Pending: Sputum gram stain/culture, Strep pneumo & Legionella urinary antigen  #Acute Kidney Injury #Hypertension Cr 1.88 on admission (baseline Cr: ~1.2). Suspect pre-renal etiology 2/2 to poor intake recently. S/p 0.5L of LR, f/u repeat Cr 2.0. Recently had dysuria, subjective decreased urine output. Holding Lisinopril and Lasix. Lactate is normal now. 10/28: Urinalysis noninfectious and Cr is 1.15, returned to baseline. Since blood pressures have trended into hypertensive readings, will restart some of her home anti-HTN regimen. - RESTART Spironolactone 25 mg, Amlodipine 10 mg - Blood cultures NGTD <48 hours - Strict I/Os - Morning BMP  #Type 1 Diabetes Mellitus Glucose 267 in the setting of steroids. Home regimen: Tresiba 20 units in the morning. Will defer urinary ketones as the patient does not have a primary acidosis, nor does she have lack of insulin or symptoms of DKA. Adjust long acting insulin according to 24 hour blood sugar. - Increase SEMGLEE to 18 units - Meal time sliding scale insulin aspart (novolog)   Chronic Problems: Dizziness: Patient has been  followed by PCP for months for her dizzy spells. They were considering Amitriptyline as a cause. HFpEF, HTN: BNP negative, CXR negative for edema. RESTART Spironolactone 25 mg, Amlodipine 10 mg, HOLD lisinopril 40 mg, and PRN Lasix 40 mg as her blood pressure is normal w/ AKI. Hyperlipidemia: Continue home Rosuvastatin 20 mg Chronic pain: Hold Amtriptyline 10, Tizanidine 4 mg TID, replace percocet with medications here. Depression: Continue Bupropion 150 BID, Sertraline 100 Anxiety: Continue Atarax 25 PRN, Gabapentin 600 mg TID GERD: Hold Pantoprazole 40  Diet: NPO until Speech Language Pathologist evaluation IVF: s/p 1L LR VTE: Enoxaparin Code: Full PT/OT recs: Pending   LOS: 1 day   Kayleen Memos, Medical Student 07/20/2023, 6:50 AM

## 2023-07-20 NOTE — Plan of Care (Signed)
  Problem: Education: Goal: Ability to describe self-care measures that may prevent or decrease complications (Diabetes Survival Skills Education) will improve Outcome: Progressing   Problem: Coping: Goal: Ability to adjust to condition or change in health will improve Outcome: Progressing   Problem: Nutritional: Goal: Maintenance of adequate nutrition will improve Outcome: Progressing   Problem: Skin Integrity: Goal: Risk for impaired skin integrity will decrease Outcome: Progressing   

## 2023-07-20 NOTE — Progress Notes (Signed)
PHARMACY - PHYSICIAN COMMUNICATION CRITICAL VALUE ALERT - BLOOD CULTURE IDENTIFICATION (BCID)  [name] is an 52 y.o. female who presented to [facility] on 07/18/2023 with a chief complaint of SOB, increased sputum production and reported fever in setting of trach dependence secondary to tracheal stenosis.    Assessment:  1/4 bottles anaerobic GPC on gram stain, BCID negative (likely contaminant)  Name of physician (or Provider) Contacted: Dr. Rosaura Carpenter  Current antibiotics: none, s/p Cefepime/vanc/azithro x1 and Rocephin 10/27 > 10/28  Changes to prescribed antibiotics recommended:  No changes needed  Results for orders placed or performed during the hospital encounter of 07/18/23  Blood Culture ID Panel (Reflexed) (Collected: 07/18/2023 11:37 PM)  Result Value Ref Range   Enterococcus faecalis NOT DETECTED NOT DETECTED   Enterococcus Faecium NOT DETECTED NOT DETECTED   Listeria monocytogenes NOT DETECTED NOT DETECTED   Staphylococcus species NOT DETECTED NOT DETECTED   Staphylococcus aureus (BCID) NOT DETECTED NOT DETECTED   Staphylococcus epidermidis NOT DETECTED NOT DETECTED   Staphylococcus lugdunensis NOT DETECTED NOT DETECTED   Streptococcus species NOT DETECTED NOT DETECTED   Streptococcus agalactiae NOT DETECTED NOT DETECTED   Streptococcus pneumoniae NOT DETECTED NOT DETECTED   Streptococcus pyogenes NOT DETECTED NOT DETECTED   A.calcoaceticus-baumannii NOT DETECTED NOT DETECTED   Bacteroides fragilis NOT DETECTED NOT DETECTED   Enterobacterales NOT DETECTED NOT DETECTED   Enterobacter cloacae complex NOT DETECTED NOT DETECTED   Escherichia coli NOT DETECTED NOT DETECTED   Klebsiella aerogenes NOT DETECTED NOT DETECTED   Klebsiella oxytoca NOT DETECTED NOT DETECTED   Klebsiella pneumoniae NOT DETECTED NOT DETECTED   Proteus species NOT DETECTED NOT DETECTED   Salmonella species NOT DETECTED NOT DETECTED   Serratia marcescens NOT DETECTED NOT  DETECTED   Haemophilus influenzae NOT DETECTED NOT DETECTED   Neisseria meningitidis NOT DETECTED NOT DETECTED   Pseudomonas aeruginosa NOT DETECTED NOT DETECTED   Stenotrophomonas maltophilia NOT DETECTED NOT DETECTED   Candida albicans NOT DETECTED NOT DETECTED   Candida auris NOT DETECTED NOT DETECTED   Candida glabrata NOT DETECTED NOT DETECTED   Candida krusei NOT DETECTED NOT DETECTED   Candida parapsilosis NOT DETECTED NOT DETECTED   Candida tropicalis NOT DETECTED NOT DETECTED   Cryptococcus neoformans/gattii NOT DETECTED NOT DETECTED    [name], PharmD Clinical Pharmacist 07/20/2023  6:34 PM

## 2023-07-20 NOTE — Evaluation (Signed)
Clinical/Bedside Swallow Evaluation Patient Details  Name: [name] MRN: [id number] Date of Birth: Jun 24, 1971  Today's Date: 07/20/2023 Time: SLP Start Time (ACUTE ONLY): 1144 SLP Stop Time (ACUTE ONLY): 1205 SLP Time Calculation (min) (ACUTE ONLY): 21 min  Past Medical History:  Past Medical History:  Diagnosis Date   Abnormal UGI series    Acute pain of right shoulder due to trauma 10/05/2018   Acute respiratory failure with hypoxia and hypercapnia (HCC) 10/23/2018   Allergy    Anemia    Anxiety    Arthritis    Asthma    Chest pain of uncertain etiology 03/22/2019   Chronic back pain    Chronic chest pain    resolved, no problems since 2019 per patient 07/27/19   Coffee ground vomiting 10/14/2021   COPD (chronic obstructive pulmonary disease) (HCC)    inhaler   Depression    DM (diabetes mellitus) (HCC)    INSULIN DEPENDENT - TYPE 1   Dyspnea    GERD (gastroesophageal reflux disease)    Headache(784.0)    History of hiatal hernia    Hyperlipidemia    no med, diet controlled   Hypertension    Hypokalemia    Klebsiella cystitis 03/14/2017   Last Assessment & Plan:   Formatting of this note might be different from the original.  - On Zosyn  - Urine culture: Klebsiella pneumoniae   Neuromuscular disorder (HCC)    Osteoarthritis    Oxygen deficiency    Pneumonia    Respiratory disease 05/2017   uses inhalers, neb tx prn, no oxygen   Rotator cuff tear 07/28/2019   Sleep apnea    does not use CPAP due to trach, uses 2L supplement oxygen at night   Status post tracheostomy (HCC) 03/14/2017   Last Assessment & Plan:  Formatting of this note might be different from the original. - VERY CRITICAL AIRWAY WITH EXTREME PRECUATION - On tach - ENT following along (please see above for current trach management plan)   Tracheostomy in place [facility]) 02/2017   Past Surgical History:  Past Surgical History:  Procedure Laterality Date   ABDOMINAL HYSTERECTOMY  2009    APPENDECTOMY     BIOPSY  10/15/2021   Procedure: BIOPSY;  Surgeon: Tressia Danas, MD;  Location: [facility] ENDOSCOPY;  Service: Gastroenterology;;   CESAREAN SECTION     x 3   CHOLECYSTECTOMY N/A 03/02/2014   Procedure: LAPAROSCOPIC CHOLECYSTECTOMY;  Surgeon: Clovis Pu. Cornett, MD;  Location: [facility] OR;  Service: General;  Laterality: N/A;   ESOPHAGOGASTRODUODENOSCOPY (EGD) WITH PROPOFOL N/A 10/15/2021   Procedure: ESOPHAGOGASTRODUODENOSCOPY (EGD) WITH PROPOFOL;  Surgeon: Tressia Danas, MD;  Location: [facility] ENDOSCOPY;  Service: Gastroenterology;  Laterality: N/A;   HERNIA REPAIR     PANENDOSCOPY N/A 03/04/2017   Procedure: PANENDOSCOPY WITH POSSIBLE FOREIGN BODY REMOVAL;  Surgeon: Flo Shanks, MD;  Location: [facility] OR;  Service: ENT;  Laterality: N/A;   ROTATOR CUFF REPAIR Left    ROTATOR CUFF REPAIR Right 07/27/2019   SHOULDER ARTHROSCOPY WITH SUBACROMIAL DECOMPRESSION, ROTATOR CUFF REPAIR AND BICEP TENDON REPAIR Right 07/28/2019   Procedure: RIGHT SHOULDER ARTHROSCOP, MINI OPEN ROTATOR CUFF TEAR REPAIR,  BICEPS TENODESIS, DISTAL CLAVICLE EXCISION;  Surgeon: Cammy Copa, MD;  Location: [facility] OR;  Service: Orthopedics;  Laterality: Right;   SPINE SURGERY     TOOTH EXTRACTION N/A 02/22/2021   Procedure: DENTAL RESTORATION/EXTRACTIONS;  Surgeon: Ocie Doyne, DMD;  Location: [facility] OR;  Service: Oral Surgery;  Laterality: N/A;   TRACHEOSTOMY  02/2017  TUBAL LIGATION     VESICOVAGINAL FISTULA CLOSURE W/ TAH  2009   HPI:  Pt is a [name] presenting 10/25 with syncopal episodes at home and chest pain. PMH significant for chronic trach dependence due to significant tracheal stenosis, severe persistant asthma, OSA, chronic pain syndrome, DM2, COPD, depression, HTN and lumbar fusion. Brief time on vent after admission; trach changed back to #4 uncuffed 10/28.  Recent admission for gastritis, N/V with GI consult. Had bedside swallow eval on 10/8 with no concerns for an oropharyngeal dysphagia  but s/s c/w reflux.  Followed by ENT at [facility], most recently seen 10/16 by Dr. Phylliss Bob for trach change. Pt has hx of laryngeal and grade 4 subglottic stenosis, bilateral paralysis of vocal folds, hx of minimal improvement in airway after dilations.    Assessment / Plan / Recommendation  Clinical Impression  Pt participated in clinical swallow assessment. She reports being more comfortable now that trach was changed back to a smaller size.  She reports hx of poor tolerance of PMV despite frequent attempts to use it over the years. She instead finger occludes with success and prefers to continue to use this method for speech.  She endorses sensation of food slowing/lodging in throat yesterday with tenderness upon swallowing. States it is better today with smaller trach. She drank thin liquids, ate regular solids (banana) with no concerns for dysphagia or aspiration. Function appears to be at baseline. No further SLP needs - continue regular solids/thin liquids; continue f/u with ENT/SLP at [facility]. Our service will sign off. SLP Visit Diagnosis: Dysphagia, unspecified (R13.10)    Aspiration Risk  No limitations    Diet Recommendation   Thin;Age appropriate regular  Medication Administration: Whole meds with liquid    Other  Recommendations Oral Care Recommendations: Oral care BID    Recommendations for follow up therapy are one component of a multi-disciplinary discharge planning process, led by the attending physician.  Recommendations may be updated based on patient status, additional functional criteria and insurance authorization.  Follow up Recommendations Outpatient SLP through [facility]       Swallow Study   General HPI: Pt is a [name] presenting 10/25 with syncopal episodes at home and chest pain. PMH significant for chronic trach dependence due to significant tracheal stenosis, severe persistant asthma, OSA, chronic pain syndrome, DM2, COPD, depression, HTN and lumbar fusion.  Brief time on vent after admission; trach changed back to #4 uncuffed 10/28.  Recent admission for gastritis, N/V with GI consult. Had bedside swallow eval on 10/8 with no concerns for an oropharyngeal dysphagia but s/s c/w reflux.  Followed by ENT at [facility], most recently seen 10/16 by Dr. Phylliss Bob for trach change. Pt has hx of laryngeal and grade 4 subglottic stenosis, bilateral paralysis of vocal folds, hx of minimal improvement in airway after dilations. Type of Study: Bedside Swallow Evaluation Previous Swallow Assessment: see HPI Diet Prior to this Study: Regular;Thin liquids (Level 0) Temperature Spikes Noted: No Respiratory Status: Trach Trach Size and Type: Uncuffed;#4 History of Recent Intubation: Yes Total duration of intubation (days):  (less than one day) Behavior/Cognition: Alert Oral Cavity Assessment: Within Functional Limits Oral Care Completed by SLP: No Oral Cavity - Dentition: Adequate natural dentition Vision: Functional for self-feeding Self-Feeding Abilities: Able to feed self Patient Positioning: Upright in bed Baseline Vocal Quality: Hoarse Volitional Cough: Strong Volitional Swallow: Able to elicit    Oral/Motor/Sensory Function Overall Oral Motor/Sensory Function: Within functional limits   Ice Chips  Ice chips: Within functional limits   Thin Liquid Thin Liquid: Within functional limits    Nectar Thick Nectar Thick Liquid: Not tested   Honey Thick Honey Thick Liquid: Not tested   Puree Puree: Not tested   Solid     Solid: Within functional limits      Blenda Mounts Laurice 07/20/2023,1:12 PM  Linley Moskal L. Samson Frederic, MA CCC/SLP Clinical Specialist - Acute Care SLP Acute Rehabilitation Services Office number [phone]

## 2023-07-21 ENCOUNTER — Other Ambulatory Visit (HOSPITAL_COMMUNITY): Payer: Self-pay

## 2023-07-21 DIAGNOSIS — J441 Chronic obstructive pulmonary disease with (acute) exacerbation: Secondary | ICD-10-CM | POA: Diagnosis not present

## 2023-07-21 LAB — CBC WITH DIFFERENTIAL/PLATELET
Abs Immature Granulocytes: 0.08 10*3/uL — ABNORMAL HIGH (ref 0.00–0.07)
Basophils Absolute: 0 10*3/uL (ref 0.0–0.1)
Basophils Relative: 0 %
Eosinophils Absolute: 0 10*3/uL (ref 0.0–0.5)
Eosinophils Relative: 0 %
HCT: 34.3 % — ABNORMAL LOW (ref 36.0–46.0)
Hemoglobin: 11.1 g/dL — ABNORMAL LOW (ref 12.0–15.0)
Immature Granulocytes: 1 %
Lymphocytes Relative: 21 %
Lymphs Abs: 2.5 10*3/uL (ref 0.7–4.0)
MCH: 27.8 pg (ref 26.0–34.0)
MCHC: 32.4 g/dL (ref 30.0–36.0)
MCV: 85.8 fL (ref 80.0–100.0)
Monocytes Absolute: 0.8 10*3/uL (ref 0.1–1.0)
Monocytes Relative: 7 %
Neutro Abs: 8.2 10*3/uL — ABNORMAL HIGH (ref 1.7–7.7)
Neutrophils Relative %: 71 %
Platelets: 382 10*3/uL (ref 150–400)
RBC: 4 MIL/uL (ref 3.87–5.11)
RDW: 14.2 % (ref 11.5–15.5)
WBC: 11.7 10*3/uL — ABNORMAL HIGH (ref 4.0–10.5)
nRBC: 0 % (ref 0.0–0.2)

## 2023-07-21 LAB — BASIC METABOLIC PANEL
Anion gap: 10 (ref 5–15)
BUN: 14 mg/dL (ref 6–20)
CO2: 23 mmol/L (ref 22–32)
Calcium: 9.7 mg/dL (ref 8.9–10.3)
Chloride: 105 mmol/L (ref 98–111)
Creatinine, Ser: 1.19 mg/dL — ABNORMAL HIGH (ref 0.44–1.00)
GFR, Estimated: 55 mL/min — ABNORMAL LOW (ref >60)
Glucose, Bld: 282 mg/dL — ABNORMAL HIGH (ref 70–99)
Potassium: 4.5 mmol/L (ref 3.5–5.1)
Sodium: 138 mmol/L (ref 135–145)

## 2023-07-21 LAB — GLUCOSE, CAPILLARY
Glucose-Capillary: 259 mg/dL — ABNORMAL HIGH (ref 70–99)
Glucose-Capillary: 244 mg/dL — ABNORMAL HIGH (ref 70–99)

## 2023-07-21 MED ORDER — INSULIN GLARGINE-YFGN 100 UNIT/ML ~~LOC~~ SOLN
20.0000 [IU] | Freq: Every day | SUBCUTANEOUS | Status: DC
Start: 2023-07-21 — End: 2023-07-21
  Administered 2023-07-21: 20 [IU] via SUBCUTANEOUS
  Filled 2023-07-21: qty 0.2

## 2023-07-21 MED ORDER — INSULIN ASPART 100 UNIT/ML IJ SOLN: 3.0000 [IU] | Freq: Three times a day (TID) | INTRAMUSCULAR | Status: DC

## 2023-07-21 MED ORDER — PREDNISONE 20 MG PO TABS
40.0000 mg | ORAL_TABLET | Freq: Every day | ORAL | 0 refills | Status: AC
  Filled 2023-07-21: qty 4, 2d supply, fill #0

## 2023-07-21 MED ORDER — AMOXICILLIN-POT CLAVULANATE 875-125 MG PO TABS
1.0000 | ORAL_TABLET | Freq: Two times a day (BID) | ORAL | Status: DC
  Administered 2023-07-21: 1 via ORAL
  Filled 2023-07-21: qty 1

## 2023-07-21 MED ORDER — ACETAMINOPHEN 325 MG PO TABS: 325.0000 mg | ORAL_TABLET | Freq: Three times a day (TID) | ORAL | Status: DC | PRN

## 2023-07-21 MED ORDER — ONDANSETRON 4 MG PO TBDP
4.0000 mg | ORAL_TABLET | Freq: Four times a day (QID) | ORAL | Status: DC
  Administered 2023-07-21: 4 mg via ORAL
  Filled 2023-07-21: qty 1

## 2023-07-21 MED ORDER — OXYCODONE HCL 5 MG PO TABS
10.0000 mg | ORAL_TABLET | Freq: Three times a day (TID) | ORAL | Status: DC | PRN
  Administered 2023-07-21: 10 mg via ORAL
  Filled 2023-07-21: qty 2

## 2023-07-21 MED ORDER — GUAIFENESIN 100 MG/5ML PO LIQD
5.0000 mL | Freq: Four times a day (QID) | ORAL | 0 refills | Status: AC | PRN
  Filled 2023-07-21: qty 118, 6d supply, fill #0

## 2023-07-21 MED ORDER — AMOXICILLIN-POT CLAVULANATE 875-125 MG PO TABS
1.0000 | ORAL_TABLET | Freq: Two times a day (BID) | ORAL | 0 refills | Status: AC
  Filled 2023-07-21: qty 28, 14d supply, fill #0

## 2023-07-21 MED ORDER — TIZANIDINE HCL 4 MG PO TABS
4.0000 mg | ORAL_TABLET | Freq: Three times a day (TID) | ORAL | Status: DC | PRN
  Administered 2023-07-21: 4 mg via ORAL
  Filled 2023-07-21: qty 1

## 2023-07-21 MED ORDER — ONDANSETRON 4 MG PO TBDP
4.0000 mg | ORAL_TABLET | Freq: Three times a day (TID) | ORAL | 0 refills | Status: DC | PRN
  Filled 2023-07-21: qty 30, 10d supply, fill #0

## 2023-07-21 MED ORDER — OXYCODONE-ACETAMINOPHEN 10-325 MG PO TABS: 1.0000 | ORAL_TABLET | Freq: Three times a day (TID) | ORAL | Status: DC | PRN

## 2023-07-21 MED ORDER — INSULIN LISPRO (1 UNIT DIAL) 100 UNIT/ML (KWIKPEN): 10.0000 [IU] | PEN_INJECTOR | Freq: Three times a day (TID) | SUBCUTANEOUS | Status: DC

## 2023-07-21 NOTE — Discharge Instructions (Addendum)
You were hospitalized for shortness of breath, increased secretions and acute kidney injury. Thank you for allowing [place] to be part of your care.    Please note these changes made to your medications:   *Please START taking:  - Augmentin 2 tablets every 12 hours daily for 2 weeks and complete the full course  - Complete prednisone course   Please make sure to follow-up with Dr. Alvis Lemmings within 2 weeks to discuss hospitalization.   Labs for her to collect include: CBC to assess white blood cell counts, BMP to assess kidney function and repeat blood culture to assess resolution of infection.

## 2023-07-21 NOTE — Progress Notes (Signed)
Patient has been discharged, AVS given and educated, IV removed, received meds from [facility], CCMD notified. All the questions addressed while educating her.

## 2023-07-21 NOTE — Inpatient Diabetes Management (Signed)
Inpatient Diabetes Program Recommendations  AACE/ADA: New Consensus Statement on Inpatient Glycemic Control (2015)  Target Ranges:  Prepandial:   less than 140 mg/dL      Peak postprandial:   less than 180 mg/dL (1-2 hours)      Critically ill patients:  140 - 180 mg/dL   Lab Results  Component Value Date   GLUCAP 259 (H) 07/21/2023   HGBA1C 8.9 (H) 06/28/2023    Review of Glycemic Control  Latest Reference Range & Units 07/20/23 06:03 07/20/23 12:05 07/20/23 17:23 07/20/23 20:47 07/21/23 06:04  Glucose-Capillary 70 - 99 mg/dL 865 (H) 784 (H) 696 (H) 373 (H) 259 (H)  Diabetes history: DM 2 Outpatient Diabetes medications: Tresiba 20 units Daily, Humalog 10 units tid Current orders for Inpatient glycemic control:  Semglee 20 units Daily Novolog 0-15 units tid   A1c 8.9% on 06/28/2023 PO prednisone 40 mg Daily  Inpatient Diabetes Program Recommendations:    Consider adding Novolog 3 units tid with meals for meal coverage (hold if patient eats less than 50% or  NPO).   Thanks,  Beryl Meager, RN, BC-ADM Inpatient Diabetes Coordinator Pager [phone] (8a-5p)

## 2023-07-21 NOTE — Care Management Important Message (Signed)
Important Message  Patient Details  Name: [name] MRN: [id number] Date of Birth: 1971/09/01   Important Message Given:  Yes - Medicare IM     Sherilyn Banker 07/21/2023, 12:12 PM

## 2023-07-21 NOTE — Progress Notes (Signed)
PT Cancellation Note  Patient Details Name: [name] MRN: [id number] DOB: 1971/06/07   Cancelled Treatment:    Reason Eval/Treat Not Completed: Other (comment); follow up on pt though prepped for d/c.  Discussed issues related to stair negotiation at home and that her BP did drop with OT yesterday.  She verbalized understanding.  Will defer PT as needs to save strength for going home.    Elray Mcgregor 07/21/2023, 3:26 PM Sheran Lawless, PT Acute Rehabilitation Services [phone] 07/21/2023

## 2023-07-21 NOTE — TOC Transition Note (Signed)
Transition of Care (TOC) - CM/SW Discharge Note Donn Pierini RN, BSN Transitions of Care Unit 4E- RN Case Manager See Treatment Team for direct phone #   Patient Details  Name: [name] MRN: [id number] Date of Birth: 1970-12-23  Transition of Care [facility]) CM/SW Contact:  Brittney Span, RN Phone Number: 07/21/2023, 2:33 PM   Clinical Narrative:    Per attending team pt stable for transition home today.  Noted recs for HHOT, outpt SLP.   CM spoke with pt at bedside for transition needs.  Pt is from home w/ spouse, chronic Trach.  Pt indicated she has needed DME at home Trach supplies and home 02 are supplied with [employer].  Pt also confirmed she has aide services in the home 3-4hrs per day.   Discussed with pt recommendations for OT and SLP follow up- pt indicated she does not feel she needs the follow up at this time, and declined any referral to outpt therapy for OT/SLP needs.   Pt confirmed spouse will transport home.   No TOC needs noted for transition home later today.    Final next level of care: Home/Self Care Barriers to Discharge: No Barriers Identified   Patient Goals and CMS Choice CMS Medicare.gov Compare Post Acute Care list provided to:: Patient Choice offered to / list presented to : NA  Discharge Placement                 Home        Discharge Plan and Services Additional resources added to the After Visit Summary for   In-house Referral: NA Discharge Planning Services: CM Consult Post Acute Care Choice: NA          DME Arranged: N/A DME Agency: NA       HH Arranged: NA HH Agency: NA        Social Determinants of Health (SDOH) Interventions SDOH Screenings   Food Insecurity: No Food Insecurity (06/28/2023)  Housing: Medium Risk (06/28/2023)  Transportation Needs: No Transportation Needs (07/03/2023)  Utilities: Not At Risk (06/28/2023)  Alcohol Screen: Low Risk  (02/18/2023)  Depression (PHQ2-9): High Risk  (04/06/2023)  Financial Resource Strain: Medium Risk (06/06/2021)  Physical Activity: Inactive (02/18/2023)  Social Connections: Moderately Isolated (02/18/2023)  Stress: Stress Concern Present (02/18/2023)  Tobacco Use: Medium Risk (07/17/2023)     Readmission Risk Interventions    07/21/2023    2:33 PM 06/30/2023   10:04 AM 08/26/2021    2:49 PM  Readmission Risk Prevention Plan  Transportation Screening Complete Complete Complete  PCP or Specialist Appt within 5-7 Days  Complete   PCP or Specialist Appt within 3-5 Days Complete  Complete  Home Care Screening  Complete   Medication Review (RN CM)  Referral to Pharmacy   HRI or Home Care Consult Complete  Complete  Social Work Consult for Recovery Care Planning/Counseling Complete  Complete  Palliative Care Screening Not Applicable  Not Applicable  Medication Review Oceanographer) Complete  Complete

## 2023-07-21 NOTE — Plan of Care (Signed)
  Problem: Education: Goal: Ability to describe self-care measures that may prevent or decrease complications (Diabetes Survival Skills Education) will improve Outcome: Progressing   Problem: Coping: Goal: Ability to adjust to condition or change in health will improve Outcome: Progressing   Problem: Fluid Volume: Goal: Ability to maintain a balanced intake and output will improve Outcome: Progressing   Problem: Health Behavior/Discharge Planning: Goal: Ability to manage health-related needs will improve Outcome: Progressing   Problem: Metabolic: Goal: Ability to maintain appropriate glucose levels will improve Outcome: Progressing   

## 2023-07-21 NOTE — Discharge Summary (Addendum)
Name: [name] MRN: [id number] DOB: [birth year] 52 y.o. PCP: Brittney Register, MD  Date of Admission: 07/18/2023 11:10 PM Date of Discharge:  07/21/2023  Attending Physician: Dr. Criselda Bradley  DISCHARGE DIAGNOSIS:  Primary Problem: Asthma, chronic obstructive, with acute exacerbation [facility])   Hospital Problems: Principal Problem:   Asthma, chronic obstructive, with acute exacerbation (HCC) Active Problems:   OSA (obstructive sleep apnea)   Chronic pain syndrome   Tracheal stenosis   AKI (acute kidney injury) (HCC)   Tracheostomy dependent (HCC)   Diabetes (HCC)    DISCHARGE MEDICATIONS:   Allergies as of 07/21/2023       Reactions   Reglan [metoclopramide] Other (See Comments)   Panic attack        Medication List     STOP taking these medications    acetaminophen 500 MG tablet Commonly known as: TYLENOL   methocarbamol 750 MG tablet Commonly known as: ROBAXIN   pregabalin 50 MG capsule Commonly known as: LYRICA       TAKE these medications    albuterol (2.5 MG/3ML) 0.083% nebulizer solution Commonly known as: PROVENTIL Take 3 mLs (2.5 mg total) by nebulization every 6 (six) hours as needed for wheezing or shortness of breath. What changed: Another medication with the same name was changed. Make sure you understand how and when to take each.   albuterol 108 (90 Base) MCG/ACT inhaler Commonly known as: VENTOLIN HFA INHALE 2 PUFFS BY MOUTH EVERY 6 HOURS AS NEEDED FOR WHEEZING FOR SHORTNESS OF BREATH What changed: See the new instructions.   amitriptyline 10 MG tablet Commonly known as: ELAVIL Take 1 tablet (10 mg total) by mouth at bedtime.   amLODipine 10 MG tablet Commonly known as: NORVASC Take 10 mg by mouth daily.   amoxicillin-clavulanate 875-125 MG tablet Commonly known as: AUGMENTIN Take 1 tablet by mouth every 12 (twelve) hours for 14 days.   buPROPion 150 MG 12 hr tablet Commonly known as: Wellbutrin SR Take 1 tablet  (150 mg total) by mouth 2 (two) times daily.   cetirizine 10 MG tablet Commonly known as: ZYRTEC TAKE 1 TABLET BY MOUTH AT BEDTIME   Dulera 200-5 MCG/ACT Aero Generic drug: mometasone-formoterol Inhale 2 puffs into the lungs in the morning and at bedtime.   FreeStyle Libre 2 Reader Hardie Pulley USE TO CHECK BLOOD SUGAR THREE TIMES DAILY.   FreeStyle Libre 2 Sensor Misc USE TO CHECK BLOOD GLUCOSE THREE TIMES DAILY.  CHANGE SENSOR ONCE EVERY 14 DAYS.   furosemide 40 MG tablet Commonly known as: LASIX Take 1 tablet (40 mg total) by mouth daily as needed for fluid or edema.   gabapentin 600 MG tablet Commonly known as: Neurontin Take 1 tablet (600 mg total) by mouth 3 (three) times daily.   guaiFENesin 100 MG/5ML liquid Commonly known as: ROBITUSSIN Take 5 mLs by mouth every 6 (six) hours as needed for cough or to loosen phlegm.   hydrOXYzine 25 MG tablet Commonly known as: ATARAX Take 1 tablet (25 mg total) by mouth 3 (three) times daily as needed for anxiety.   insulin lispro 100 UNIT/ML KwikPen Commonly known as: HumaLOG KwikPen Inject 10 Units into the skin with breakfast, with lunch, and with evening meal. Continue to hold if not eating majority of meals What changed:  when to take this additional instructions   Insulin Pen Needle 31G X 8 MM Misc Use to inject [place] and Humalog. Pt may use 4 pen needles daily.   lisinopril 20 MG tablet Commonly  known as: ZESTRIL Take 1 tablet (20 mg total) by mouth daily. What changed: Another medication with the same name was removed. Continue taking this medication, and follow the directions you see here.   Misc. Facilities manager. Devices Misc Nebulizer device. Diagnosis - Asthma   montelukast 10 MG tablet Commonly known as: SINGULAIR Take 1 tablet (10 mg total) by mouth at bedtime.   ondansetron 4 MG disintegrating tablet Commonly known as: ZOFRAN-ODT Take 1 tablet (4 mg total) by mouth every 8 (eight) hours as  needed for nausea or vomiting. What changed: additional instructions   OneTouch Delica Lancets 33G Misc Use to check glucose levels daily; E11.9   OneTouch Verio Flex System w/Device Kit Use to check glucose levels daily   OneTouch Verio test strip Generic drug: glucose blood Use to check glucose levels daily; E11.9   oxyCODONE-acetaminophen 10-325 MG tablet Commonly known as: PERCOCET Take 1 tablet by mouth 4 (four) times daily as needed for pain.   pantoprazole 40 MG tablet Commonly known as: PROTONIX Take 1 tablet (40 mg total) by mouth 2 (two) times daily.   predniSONE 20 MG tablet Commonly known as: DELTASONE Take 2 tablets (40 mg total) by mouth daily with breakfast for 2 days. Start taking on: July 22, 2023   promethazine 25 MG suppository Commonly known as: PHENERGAN Place 1 suppository (25 mg total) rectally every 8 (eight) hours as needed for nausea or vomiting.   rosuvastatin 20 MG tablet Commonly known as: CRESTOR Take 1 tablet by mouth once daily   sertraline 100 MG tablet Commonly known as: ZOLOFT Take 2 tablets (200 mg total) by mouth at bedtime.   spironolactone 25 MG tablet Commonly known as: ALDACTONE Take 25 mg by mouth daily.   tiZANidine 4 MG tablet Commonly known as: ZANAFLEX Take 1 tablet (4 mg total) by mouth every 8 (eight) hours as needed for muscle spasms.   Evaristo Bury FlexTouch 200 UNIT/ML FlexTouch Pen Generic drug: insulin degludec Inject 20 Units into the skin daily.        DISPOSITION AND FOLLOW-UP:  [name] was discharged from [facility] in stable condition.  Follow-up Recommendations: Consults: Pulmonology and ENT, follow-up chronic medical conditions  Labs: Repeat CBC, BMP and Blood cultures after completing antibiotics  Studies: None Medications:  - Complete 2 week Augmentin course, 2 out 4 Bcx with gram positive cocci - Complete remaining prednisone course  - Continue with  Home Tresiba 20 units  - Hold home humalog due to poor PO intake, can resume with increased PO intake  Follow-up Appointments:  Follow-up with [facility] Dr. Margo Bradley 08/26/2023 at 2:30 PM   Follow-up with [facility] Pulmonology Dr. Wynona Bradley on 09/14/2023 at 3:00 PM   HOSPITAL COURSE:  Patient Summary: Principal Problem:   Asthma, chronic obstructive, with acute exacerbation (HCC) Active Problems:   OSA (obstructive sleep apnea)   Chronic pain syndrome   Tracheal stenosis   AKI (acute kidney injury) (HCC)   Tracheostomy dependent (HCC)   Diabetes (HCC)  Resolved Problems:   Syncope   Acute on chronic hypoxic respiratory failure (HCC)   Increased anion gap metabolic acidosis  Consults: -pccm  Procedures: -trach exchange  Follow-up items: -OT recommended HH services  - Complete 2 weeks of Augmentin - Complete additional 2 days of Prednisone  - Follow-up with scheduled Pulm and ENT appointments   [name] is a 52 y.o. person living with a history ofHTN, HFpEF, chronic respiratory  failure 3 L intermittent supplemental oxygen use, severe persistent asthma s/p trach dependent, chronic pain, depression, anxiety and diabetes  who presented to the emergency department for shortness of breath admitted for Acute on chronic respiratory failure on 10/27   Acute on chronic respiratory failure Patient presented to the emergency department with increasing supplemental oxygen support, increased sputum production, and new cough. Her acute on chronic respiratory failure is possibly due to COPD exacerbation 2/2 recent URI vs asthma exacerbation. The acute on chronic respiratory failure is less likely due to PE, CTA on 10/25 did not reveal a PE. Another consideration although less likely at this time is pneumonia, the CXR did not reveal consolidations. HFpEF exacerbation also considered but less likely at this time, she is euvolemic on exam, CXR does not reveal pulmonary edema and, BNP  is 20.1.  Critical care was consulted for tracheostomy care and her trach was exchanged during this hospitalization.  Patient's oxygenation status was maintained on a trach humidifier with 6 L of oxygen provide it to improve respiratory status.  The patient was started on IV Solu-Medrol and transitioned to oral prednisone was tolerating pills.  She was instructed to complete a 5-day course of steroids total.  To improve respiratory status patient also received nebulizer treatments with Brovana, Pulmicort and Yulpelri daily. On day of discharge the patient was gradually weaned off of her vent and was stable on room air at her reports of 100%..  Anion gap metabolic acidosis 2/2 lactic acidosis  Leukocytosis  Gram positive bacteremia (bacteria identification pending at time of discharge) Patient presented with an anion gap of 18 and lactic acidosis of 3.7 that improved to 2.5. The lactic acidosis is likely due to underlying infection (UTI vs respiratory. Anion gap metabolic acidosis is less likely from DKA, her BG is 267. The patient was empirically treated with azithromycin and and ceftriaxone.  Blood Culture ID was negative, however 2 out of 4 blood cultures grew gram-positive cocci.  Suspected that culture result was related to patient's dental hygiene versus contaminant.  Patient has chronically had dental caries that that have yet to be treated in the outpatient setting.  As a result she transitioned to a 2-week course of Augmentin.  On day of discharge the patient's leukocytosis was down trending to 11.7 and the patient was hemodynamically stable, afebrile and satting well on room air.   Syncopal episodes  Based on prior records this does not appear to be an acute problem. Syncope is likely multifactorial from vasovagal (pt was experiencing similar symptoms in the ED while coughing) and possibly hypoglycemic.  Patient was placed on telemetry and orthostatics were collected which are negative.  The  patient's home amitriptyline, lisinopril and furosemide were held.   AKI  She presented to the ED with an increase in Scr to 1.88, her baseline appears to range from 1.3 to 1.5. AKI is likely from oral intake, she reports no change in appetite or oral intake; however, the ED provider on 10/25 reported "very poor oral intake". There was some concern for urinary retention due to patient's decreased urinary frequency, but the bladder scan showed . Patient was resuscitated with fluids and creatinine improved back to baseline prior to discharge at 1.19   Chest Pain Most likely MSK due to increase in coughing from recent URI, less likely ACS with a  reassuring troponin of 5 and EKG that did not show ST elevation or depression and reproducible chest pain on exam. Robitussin was ordered for cough management  DM type 2, requiring insulin Home regimen is 20 units tresiba in the morning, she reported episodes of hypoglycemia into the 60s.  While inpatient patient's insulin regimen was adjusted and prior to discharge her regimen was Semglee 20 units daily and Novalog 3 units with meals. sliding scale insulin for mealtime and  bedtime coverage utilized as needed.      HFpEF LVEF 50% (01/2023) HTN Appears compensated, physical exam shows euvolemia, BNP is 20.1, and CXR does not show pulmonary edema. Home lisinopril and lasix were held.  Patient intermittently became hypertensive through out the admission up to the 170s systolic's, her home amlodipine and spironolactone were restarted to optimize blood pressure control.  At time of discharge her blood pressure was improved   Chronic Pain 2/2 lumbar pain  Patients home regimen includes Percocet 10-325mg  QID prn, Tizanidine 4 mg TID and Gabapentin 600 mg TID prn. Pain was controlled on IV pain medications (Dilaudid) until patient could tolerate PO. Liquid formulation of gabapentin was made available prn. Once tolerating PO, patient restarted her home regimen.   Patient follows with a pain clinic outpatient and will defer further management to them.  Obesity class III Meets criteria based on BMI.  Defer to outpatient management.      DISCHARGE INSTRUCTIONS:   Discharge Instructions     Call MD for:  difficulty breathing, headache or visual disturbances   Complete by: As directed    Call MD for:  persistant dizziness or light-headedness   Complete by: As directed    Call MD for:  persistant nausea and vomiting   Complete by: As directed    Call MD for:  severe uncontrolled pain   Complete by: As directed    Diet - low sodium heart healthy   Complete by: As directed    Increase activity slowly   Complete by: As directed       Medications:  - Complete 2 week Augmentin course  - Complete remaining prednisone course  - Continue with home Tresiba 20 units  - Hold home humalog due to poor PO intake, can resume with increased PO intake  SUBJECTIVE:   Patient breathing improved this morning on room air. Nausea is at baseline and attempting to tolerate PO better. Reports a spasm like back pain this morning and requested home tizanidine. Home medication ordered and amenable with transitioning to PO pain medications. Patient aware of blood culture results, suspect it may be contaminant, however given chronic dental issues with cavities, instructed to complete 2 week course of antibiotics and verbalized understanding. Discussed discharge home today, patient amenable with discharge.   Discharge Vitals:   BP (!) 133/97 (BP Location: Left Arm)   Pulse (!) 104   Temp 98.1 F (36.7 C) (Oral)   Resp 20   Ht 5\' 1"  (1.549 m)   Wt 113.6 kg   LMP  (LMP Unknown)   SpO2 97%   BMI 47.32 kg/m  on room air   OBJECTIVE:    Constitutional:      General: She is awake.     Appearance: Normal appearance. She is obese. She is not ill-appearing, toxic-appearing or diaphoretic. No acute distress     Comments:  Laying in the bed this morning   HENT:      Mouth/Throat:     Mouth: Mucous membranes are moist.     Dentition: Abnormal dentition. Dental caries present.      Comments: Fractured tooth #32, caries on #17 Eyes:  Conjunctiva/sclera: Conjunctivae normal.  Neck:     Trachea: Tracheostomy present.     Comments: Neck appears symmetrical, tracheostomy in place, exchanged this hospitalization, dependent since 2018 Cardiovascular:     Rate and Rhythm: Normal rate. Normal rhythm.  Pulmonary:     Effort: Pulmonary effort is normal.     Breath sounds: Normal breath sounds in the anterior fields. Exam limited due to patient positioning. No wheezing.      On room air  Abdominal:     General: Bowel sounds are normal. There is no distension.     Palpations: Abdomen is soft.     Tenderness: There is no abdominal tenderness, no guarding noted.  Neurological:     General: Baseline.     Mental Status: She is alert.  Psychiatric:        Behavior: Behavior is cooperative.   Pertinent Labs, Studies, and Procedures:     Latest Ref Rng & Units 07/21/2023    4:27 AM 07/20/2023    4:34 AM 07/18/2023   11:17 PM  CBC  WBC 4.0 - 10.5 K/uL 11.7  13.1    Hemoglobin 12.0 - 15.0 g/dL 16.1  09.6  04.5   Hematocrit 36.0 - 46.0 % 34.3  33.2  41.0   Platelets 150 - 400 K/uL 382  364         Latest Ref Rng & Units 07/21/2023    4:27 AM 07/20/2023    4:34 AM 07/18/2023   11:17 PM  CMP  Glucose 70 - 99 mg/dL 409  811  914   BUN 6 - 20 mg/dL 14  17  20    Creatinine 0.44 - 1.00 mg/dL 7.82  9.56  2.13   Sodium 135 - 145 mmol/L 138  136  142   Potassium 3.5 - 5.1 mmol/L 4.5  4.3  3.9   Chloride 98 - 111 mmol/L 105  102  104   CO2 22 - 32 mmol/L 23  24    Calcium 8.9 - 10.3 mg/dL 9.7  9.7      DG Chest Port 1 View  Result Date: 07/18/2023 CLINICAL DATA:  Questionable sepsis EXAM: PORTABLE CHEST 1 VIEW COMPARISON:  Chest x-ray 07/17/2023 FINDINGS: Tracheostomy is unchanged in position. Lung volumes are low likely accentuating central pulmonary  vascularity. The heart is mildly enlarged. There is no lung consolidation, pleural effusion or pneumothorax. No acute fractures are seen. IMPRESSION: Low lung volumes likely accentuating central pulmonary vascularity. No lung consolidation. Electronically Signed   By: Darliss Cheney M.D.   On: 07/18/2023 23:22     Signed: Peterson Ao, MD Psychiatry Resident, PGY-1 [facility]  Pager: [phone] 2:55 PM, 07/21/2023

## 2023-07-22 ENCOUNTER — Telehealth: Payer: Self-pay

## 2023-07-22 DIAGNOSIS — Z79899 Other long term (current) drug therapy: Secondary | ICD-10-CM | POA: Diagnosis not present

## 2023-07-22 DIAGNOSIS — R030 Elevated blood-pressure reading, without diagnosis of hypertension: Secondary | ICD-10-CM | POA: Diagnosis not present

## 2023-07-22 DIAGNOSIS — M5416 Radiculopathy, lumbar region: Secondary | ICD-10-CM | POA: Diagnosis not present

## 2023-07-22 DIAGNOSIS — R0902 Hypoxemia: Secondary | ICD-10-CM | POA: Diagnosis not present

## 2023-07-22 DIAGNOSIS — G4730 Sleep apnea, unspecified: Secondary | ICD-10-CM | POA: Diagnosis not present

## 2023-07-22 NOTE — Transitions of Care (Post Inpatient/ED Visit) (Signed)
07/22/2023  Name: [name] MRN: [id number] DOB: [birth year]  Today's TOC FU Call Status: Today's TOC FU Call Status:: Unsuccessful Call (1st Attempt) Unsuccessful Call (1st Attempt) Date: 07/22/23  Attempted to reach the patient regarding the most recent Inpatient/ED visit.  Follow Up Plan: Additional outreach attempts will be made to reach the patient to complete the Transitions of Care (Post Inpatient/ED visit) call.   Alyse Low, RN, BA, [facility], CRRN [facility] Coordinator, Transition of Care Ph # [phone]

## 2023-07-23 ENCOUNTER — Telehealth: Payer: Self-pay

## 2023-07-23 LAB — CULTURE, BLOOD (ROUTINE X 2): Special Requests: ADEQUATE

## 2023-07-23 LAB — LEGIONELLA PNEUMOPHILA SEROGP 1 UR AG: L. pneumophila Serogp 1 Ur Ag: NEGATIVE

## 2023-07-23 NOTE — Transitions of Care (Post Inpatient/ED Visit) (Signed)
07/23/2023  Name: [name] MRN: [id number] DOB: 1970-10-09  Today's TOC FU Call Status: Today's TOC FU Call Status:: Unsuccessful Call (2nd Attempt) Unsuccessful Call (2nd Attempt) Date: 07/23/23  Attempted to reach the patient regarding the most recent Inpatient/ED visit.  Follow Up Plan: Additional outreach attempts will be made to reach the patient to complete the Transitions of Care (Post Inpatient/ED visit) call.   Alyse Low, RN, BA, [facility], CRRN [facility] Coordinator, Transition of Care Ph # [phone]

## 2023-07-24 ENCOUNTER — Telehealth: Payer: Self-pay

## 2023-07-24 NOTE — Transitions of Care (Post Inpatient/ED Visit) (Signed)
   07/24/2023  Name: [name] MRN: [id number] DOB: Feb 16, 1971  Today's TOC FU Call Status: Today's TOC FU Call Status:: Unsuccessful Call (3rd Attempt) Unsuccessful Call (3rd Attempt) Date: 07/24/23  Attempted to reach the patient regarding the most recent Inpatient/ED visit.  Follow Up Plan: No further outreach attempts will be made at this time. We have been unable to contact the patient.  Alyse Low, RN, BA, [facility], CRRN [facility] Coordinator, Transition of Care Ph # [phone]

## 2023-07-26 NOTE — Progress Notes (Signed)
Based on the somewhat limited evidence available, it appears as though all 3 isolates in this culture are susceptible to beta-lactams.  Augmentin should cover these adequately.  I suspect a 2-week course will be sufficient for her.  I note that the 10/26 blood cultures are still pending as they were reintubated for better growth.  Attempted to call patient but no answer, will send MyChart message.

## 2023-07-27 ENCOUNTER — Telehealth: Payer: Self-pay | Admitting: *Deleted

## 2023-07-27 DIAGNOSIS — Z79899 Other long term (current) drug therapy: Secondary | ICD-10-CM | POA: Diagnosis not present

## 2023-07-27 NOTE — Telephone Encounter (Signed)
Received a call from pt who was in the hospital stated she received a call from Dr Benito Mccreedy about labs. Dr Benito Mccreedy is currently "out of office". She's not a pt here at our office. Sending to Dr Criselda Peaches.

## 2023-07-28 ENCOUNTER — Other Ambulatory Visit: Payer: Self-pay | Admitting: Family Medicine

## 2023-07-28 NOTE — Telephone Encounter (Signed)
I spoke with [name] on the phone. Patient's identity was confirmed using two patient specific identifiers. We discussed the results of the patient's blood cultures, recommended that she continues to take her antibiotics until she completes a 2-week course.  Return precautions discussed, all questions answered.  Patient voiced understanding of the plan.

## 2023-07-29 ENCOUNTER — Other Ambulatory Visit: Payer: Self-pay | Admitting: Critical Care Medicine

## 2023-07-29 ENCOUNTER — Other Ambulatory Visit: Payer: Self-pay | Admitting: Family Medicine

## 2023-07-29 DIAGNOSIS — G43119 Migraine with aura, intractable, without status migrainosus: Secondary | ICD-10-CM

## 2023-07-29 DIAGNOSIS — J4540 Moderate persistent asthma, uncomplicated: Secondary | ICD-10-CM

## 2023-07-29 DIAGNOSIS — E119 Type 2 diabetes mellitus without complications: Secondary | ICD-10-CM

## 2023-07-29 DIAGNOSIS — E1069 Type 1 diabetes mellitus with other specified complication: Secondary | ICD-10-CM

## 2023-07-29 LAB — CULTURE, BLOOD (ROUTINE X 2): Special Requests: ADEQUATE

## 2023-07-29 MED ORDER — AMITRIPTYLINE HCL 10 MG PO TABS: 10.0000 mg | ORAL_TABLET | Freq: Every day | ORAL | 0 refills | Status: DC

## 2023-07-29 MED ORDER — ONETOUCH VERIO FLEX SYSTEM W/DEVICE KIT: PACK | 0 refills | Status: DC

## 2023-07-29 MED ORDER — TRESIBA FLEXTOUCH 200 UNIT/ML ~~LOC~~ SOPN: 20.0000 [IU] | PEN_INJECTOR | Freq: Every day | SUBCUTANEOUS | 3 refills | Status: DC

## 2023-07-29 MED ORDER — FREESTYLE LIBRE 2 SENSOR MISC: 0 refills | Status: DC

## 2023-07-29 MED ORDER — FREESTYLE LIBRE 2 READER DEVI: 0 refills | Status: DC

## 2023-07-29 MED ORDER — ALBUTEROL SULFATE HFA 108 (90 BASE) MCG/ACT IN AERS: 2.0000 | INHALATION_SPRAY | Freq: Four times a day (QID) | RESPIRATORY_TRACT | 6 refills | Status: DC | PRN

## 2023-07-29 MED ORDER — FLUCONAZOLE 150 MG PO TABS: 150.0000 mg | ORAL_TABLET | Freq: Once | ORAL | 1 refills | Status: AC

## 2023-08-04 ENCOUNTER — Other Ambulatory Visit: Payer: Self-pay | Admitting: Family Medicine

## 2023-08-04 DIAGNOSIS — R112 Nausea with vomiting, unspecified: Secondary | ICD-10-CM

## 2023-08-04 NOTE — Telephone Encounter (Signed)
Requested medication (s) are due for refill today - unsure  Requested medication (s) are on the active medication list -yes  Future visit scheduled -no  Last refill: 1//29/24 #30  Notes to clinic: non delegated Rx  Requested Prescriptions  Pending Prescriptions Disp Refills   ondansetron (ZOFRAN) 4 MG tablet [Pharmacy Med Name: Ondansetron HCl 4 MG Oral Tablet] 30 tablet 0    Sig: TAKE 1 TABLET BY MOUTH EVERY 8 HOURS AS NEEDED FOR NAUSEA FOR VOMITING     Not Delegated - Gastroenterology: Antiemetics - ondansetron Failed - 08/04/2023 11:41 AM      Failed - This refill cannot be delegated      Passed - AST in normal range and within 360 days    AST  Date Value Ref Range Status  07/18/2023 23 15 - 41 U/L Final         Passed - ALT in normal range and within 360 days    ALT  Date Value Ref Range Status  07/18/2023 23 0 - 44 U/L Final         Passed - Valid encounter within last 6 months    Recent Outpatient Visits           4 weeks ago Throat pain   [facility] [place]. [facility] Hoy Register, MD   4 months ago Dizziness   [facility] [place]. [facility] Hoy Register, MD   6 months ago Type 1 diabetes mellitus without complication [facility])   [facility] [place]. [facility] Hoy Register, MD   9 months ago Type 1 diabetes mellitus with other specified complication [facility])   [facility] [place]. [facility] Hoy Register, MD   1 year ago Type 1 diabetes mellitus with other specified complication [facility])   [facility] [facility] [place]. [facility] Hoy Register, MD                 Requested Prescriptions  Pending Prescriptions Disp Refills   ondansetron (ZOFRAN) 4 MG tablet [Pharmacy Med Name: Ondansetron HCl 4 MG Oral Tablet] 30 tablet 0    Sig: TAKE 1  TABLET BY MOUTH EVERY 8 HOURS AS NEEDED FOR NAUSEA FOR VOMITING     Not Delegated - Gastroenterology: Antiemetics - ondansetron Failed - 08/04/2023 11:41 AM      Failed - This refill cannot be delegated      Passed - AST in normal range and within 360 days    AST  Date Value Ref Range Status  07/18/2023 23 15 - 41 U/L Final         Passed - ALT in normal range and within 360 days    ALT  Date Value Ref Range Status  07/18/2023 23 0 - 44 U/L Final         Passed - Valid encounter within last 6 months    Recent Outpatient Visits           4 weeks ago Throat pain   [facility] [place]. [facility] Hoy Register, MD   4 months ago Dizziness   [facility] [place] [place]. [facility] Hoy Register, MD   6  months ago Type 1 diabetes mellitus without complication (HCC)   [facility] [place]. [facility] Hoy Register, MD   9 months ago Type 1 diabetes mellitus with other specified complication [facility])   [place] [facility] [place]. [facility] Hoy Register, MD   1 year ago Type 1 diabetes mellitus with other specified complication [facility])   [place] [facility] [place]. [facility] Hoy Register, MD

## 2023-08-07 ENCOUNTER — Other Ambulatory Visit: Payer: Self-pay | Admitting: Family Medicine

## 2023-08-07 DIAGNOSIS — J449 Chronic obstructive pulmonary disease, unspecified: Secondary | ICD-10-CM | POA: Diagnosis not present

## 2023-08-07 DIAGNOSIS — J45909 Unspecified asthma, uncomplicated: Secondary | ICD-10-CM | POA: Diagnosis not present

## 2023-08-07 DIAGNOSIS — G4733 Obstructive sleep apnea (adult) (pediatric): Secondary | ICD-10-CM | POA: Diagnosis not present

## 2023-08-07 DIAGNOSIS — J9610 Chronic respiratory failure, unspecified whether with hypoxia or hypercapnia: Secondary | ICD-10-CM | POA: Diagnosis not present

## 2023-08-07 MED ORDER — ONETOUCH VERIO VI STRP: ORAL_STRIP | 6 refills | Status: DC

## 2023-08-11 ENCOUNTER — Other Ambulatory Visit (HOSPITAL_COMMUNITY): Payer: Self-pay

## 2023-08-11 DIAGNOSIS — J4540 Moderate persistent asthma, uncomplicated: Secondary | ICD-10-CM | POA: Diagnosis not present

## 2023-08-16 ENCOUNTER — Other Ambulatory Visit: Payer: Self-pay | Admitting: Family Medicine

## 2023-08-16 DIAGNOSIS — F330 Major depressive disorder, recurrent, mild: Secondary | ICD-10-CM

## 2023-08-19 DIAGNOSIS — M5416 Radiculopathy, lumbar region: Secondary | ICD-10-CM | POA: Diagnosis not present

## 2023-08-19 DIAGNOSIS — R030 Elevated blood-pressure reading, without diagnosis of hypertension: Secondary | ICD-10-CM | POA: Diagnosis not present

## 2023-08-19 DIAGNOSIS — Z79899 Other long term (current) drug therapy: Secondary | ICD-10-CM | POA: Diagnosis not present

## 2023-08-19 DIAGNOSIS — E119 Type 2 diabetes mellitus without complications: Secondary | ICD-10-CM | POA: Diagnosis not present

## 2023-08-21 ENCOUNTER — Encounter: Payer: Self-pay | Admitting: Family Medicine

## 2023-08-24 ENCOUNTER — Other Ambulatory Visit: Payer: Self-pay | Admitting: Family Medicine

## 2023-08-24 DIAGNOSIS — E1069 Type 1 diabetes mellitus with other specified complication: Secondary | ICD-10-CM

## 2023-08-24 MED ORDER — TRESIBA FLEXTOUCH 200 UNIT/ML ~~LOC~~ SOPN
170.0000 [IU] | PEN_INJECTOR | Freq: Every day | SUBCUTANEOUS | 3 refills | Status: DC
Start: 2023-08-24 — End: 2023-09-24

## 2023-08-26 DIAGNOSIS — Z930 Tracheostomy status: Secondary | ICD-10-CM | POA: Diagnosis not present

## 2023-08-26 DIAGNOSIS — J3802 Paralysis of vocal cords and larynx, bilateral: Secondary | ICD-10-CM | POA: Diagnosis not present

## 2023-08-26 DIAGNOSIS — J386 Stenosis of larynx: Secondary | ICD-10-CM | POA: Diagnosis not present

## 2023-08-26 DIAGNOSIS — J0410 Acute tracheitis without obstruction: Secondary | ICD-10-CM | POA: Diagnosis not present

## 2023-09-06 DIAGNOSIS — J449 Chronic obstructive pulmonary disease, unspecified: Secondary | ICD-10-CM | POA: Diagnosis not present

## 2023-09-06 DIAGNOSIS — J9610 Chronic respiratory failure, unspecified whether with hypoxia or hypercapnia: Secondary | ICD-10-CM | POA: Diagnosis not present

## 2023-09-06 DIAGNOSIS — J45909 Unspecified asthma, uncomplicated: Secondary | ICD-10-CM | POA: Diagnosis not present

## 2023-09-06 DIAGNOSIS — G4733 Obstructive sleep apnea (adult) (pediatric): Secondary | ICD-10-CM | POA: Diagnosis not present

## 2023-09-07 ENCOUNTER — Encounter: Payer: Self-pay | Admitting: Family Medicine

## 2023-09-09 ENCOUNTER — Other Ambulatory Visit: Payer: Self-pay | Admitting: Family Medicine

## 2023-09-09 DIAGNOSIS — B3731 Acute candidiasis of vulva and vagina: Secondary | ICD-10-CM

## 2023-09-10 DIAGNOSIS — J4540 Moderate persistent asthma, uncomplicated: Secondary | ICD-10-CM | POA: Diagnosis not present

## 2023-09-10 NOTE — Telephone Encounter (Signed)
Requested medications are due for refill today.  no  Requested medications are on the active medications list.  no  Last refill. 04/07/2023  Future visit scheduled.   no  Notes to clinic.  Medication not assigned to a protocol.    Requested Prescriptions  Pending Prescriptions Disp Refills   fluconazole (DIFLUCAN) 150 MG tablet [Pharmacy Med Name: Fluconazole 150 MG Oral Tablet] 1 tablet 0    Sig: TAKE 1 TABLET BY MOUTH AS A ONE TIME DOSE.     Off-Protocol Failed - 09/10/2023  8:47 AM      Failed - Medication not assigned to a protocol, review manually.      Passed - Valid encounter within last 12 months    Recent Outpatient Visits           2 months ago Throat pain   [facility] [place] Hoy Register, MD   5 months ago Dizziness   [facility] [place] Hoy Register, MD   7 months ago Type 1 diabetes mellitus without complication [facility])   [facility] [place] Hoy Register, MD   11 months ago Type 1 diabetes mellitus with other specified complication [facility])   [facility] [place]. [facility] Hoy Register, MD   1 year ago Type 1 diabetes mellitus with other specified complication [facility])   [facility] [place]. [facility] Hoy Register, MD

## 2023-09-11 ENCOUNTER — Encounter: Payer: Self-pay | Admitting: Family Medicine

## 2023-09-11 NOTE — Telephone Encounter (Signed)
 Care team updated and letter sent for eye exam notes.

## 2023-09-14 ENCOUNTER — Encounter: Payer: Self-pay | Admitting: Pulmonary Disease

## 2023-09-14 ENCOUNTER — Encounter: Payer: Self-pay | Admitting: Family Medicine

## 2023-09-14 ENCOUNTER — Ambulatory Visit (INDEPENDENT_AMBULATORY_CARE_PROVIDER_SITE_OTHER): Payer: 59 | Admitting: Pulmonary Disease

## 2023-09-14 VITALS — BP 114/60 | HR 118 | Ht 61.0 in | Wt 255.8 lb

## 2023-09-14 DIAGNOSIS — G4733 Obstructive sleep apnea (adult) (pediatric): Secondary | ICD-10-CM

## 2023-09-14 DIAGNOSIS — Z930 Tracheostomy status: Secondary | ICD-10-CM | POA: Diagnosis not present

## 2023-09-14 DIAGNOSIS — J4550 Severe persistent asthma, uncomplicated: Secondary | ICD-10-CM

## 2023-09-14 DIAGNOSIS — J9610 Chronic respiratory failure, unspecified whether with hypoxia or hypercapnia: Secondary | ICD-10-CM

## 2023-09-14 DIAGNOSIS — J398 Other specified diseases of upper respiratory tract: Secondary | ICD-10-CM

## 2023-09-14 DIAGNOSIS — J9611 Chronic respiratory failure with hypoxia: Secondary | ICD-10-CM

## 2023-09-14 MED ORDER — ZOLPIDEM TARTRATE ER 6.25 MG PO TBCR: 6.2500 mg | EXTENDED_RELEASE_TABLET | Freq: Every evening | ORAL | 0 refills | Status: DC | PRN

## 2023-09-14 NOTE — Patient Instructions (Signed)
Prescription for Ambien sent to pharmacy for you  Talk to your primary doctor about getting back on the weight loss medication -His appetite has just been approved by the FDA for moderate to severe obstructive sleep apnea and excessive weight -Losing more weight will definitely help especially since you did well with the medication previously  I will continue to look for your overnight oximetry and let you know what it shows  If the Ambien does not work well enough, we can increase the dose We may also be able to try different class of medications if it does not help  Call with significant concerns  Tentative follow-up in 3 months

## 2023-09-14 NOTE — Progress Notes (Signed)
[name]    [id number]    10/25/1970  Primary Care Physician:Newlin, Odette Horns, MD  Referring Physician: Hoy Register, MD [address] [place],  [place] [address]  Chief complaint:   Follow-up for chronic respiratory failure  HPI:  Patient with chronic respiratory failure, chronic tracheostomy  She does have some shortness of breath, some chest discomfort  Has not been sleeping well through the night just from being generally uncomfortable  Continues to follow with ENT on a regular basis No plans for taking the trach out has had multiple interventions  Did have an overnight oximetry with oxygen at 3 L which did not show significant desaturations  Most significant complaint today is basically just not sleeping well at night until   Outpatient Encounter Medications as of 09/14/2023  Medication Sig   albuterol (PROVENTIL) (2.5 MG/3ML) 0.083% nebulizer solution Take 3 mLs (2.5 mg total) by nebulization every 6 (six) hours as needed for wheezing or shortness of breath.   albuterol (VENTOLIN HFA) 108 (90 Base) MCG/ACT inhaler Inhale 2 puffs into the lungs every 6 (six) hours as needed for wheezing or shortness of breath.   amitriptyline (ELAVIL) 10 MG tablet Take 1 tablet (10 mg total) by mouth at bedtime.   amLODipine (NORVASC) 10 MG tablet Take 10 mg by mouth daily.   Blood Glucose Monitoring Suppl (ONETOUCH VERIO FLEX SYSTEM) w/Device KIT Use to check glucose levels daily   buPROPion (WELLBUTRIN SR) 150 MG 12 hr tablet Take 1 tablet (150 mg total) by mouth 2 (two) times daily.   cetirizine (ZYRTEC) 10 MG tablet TAKE 1 TABLET BY MOUTH AT BEDTIME   Continuous Glucose Receiver (FREESTYLE LIBRE 2 READER) DEVI USE TO CHECK BLOOD SUGAR THREE TIMES DAILY.   Continuous Glucose Sensor (FREESTYLE LIBRE 2 SENSOR) MISC USE TO CHECK BLOOD GLUCOSE THREE TIMES DAILY.  CHANGE SENSOR ONCE EVERY 14 DAYS.   fluconazole (DIFLUCAN) 150 MG tablet TAKE 1  TABLET BY MOUTH AS A ONE TIME DOSE.   furosemide (LASIX) 40 MG tablet Take 1 tablet (40 mg total) by mouth daily as needed for fluid or edema.   gabapentin (NEURONTIN) 600 MG tablet Take 1 tablet (600 mg total) by mouth 3 (three) times daily.   glucose blood (ONETOUCH VERIO) test strip Use to check glucose levels three times daily; E11.9   guaiFENesin (ROBITUSSIN) 100 MG/5ML liquid Take 5 mLs by mouth every 6 (six) hours as needed for cough or to loosen phlegm.   hydrOXYzine (ATARAX) 25 MG tablet Take 1 tablet (25 mg total) by mouth 3 (three) times daily as needed for anxiety.   insulin degludec (TRESIBA FLEXTOUCH) 200 UNIT/ML FlexTouch Pen Inject 170 Units into the skin daily.   insulin lispro (HUMALOG KWIKPEN) 100 UNIT/ML KwikPen Inject 10 Units into the skin with breakfast, with lunch, and with evening meal. Continue to hold if not eating majority of meals   Insulin Pen Needle 31G X 8 MM MISC Use to inject [place] and Humalog. Pt may use 4 pen needles daily.   lisinopril (ZESTRIL) 20 MG tablet Take 1 tablet (20 mg total) by mouth daily.   Misc. Devices TEFL teacher. Devices MISC Nebulizer device. Diagnosis - Asthma   mometasone-formoterol (DULERA) 200-5 MCG/ACT AERO Inhale 2 puffs into the lungs in the morning and at bedtime.   montelukast (SINGULAIR) 10 MG tablet Take 1 tablet (10 mg total) by mouth at bedtime.   ondansetron (ZOFRAN)  4 MG tablet TAKE 1 TABLET BY MOUTH EVERY 8 HOURS AS NEEDED FOR NAUSEA FOR VOMITING   ondansetron (ZOFRAN-ODT) 4 MG disintegrating tablet Take 1 tablet (4 mg total) by mouth every 8 (eight) hours as needed for nausea or vomiting.   OneTouch Delica Lancets 33G MISC Use to check glucose levels daily; E11.9   oxyCODONE-acetaminophen (PERCOCET) 10-325 MG tablet Take 1 tablet by mouth 4 (four) times daily as needed for pain.   pantoprazole (PROTONIX) 40 MG tablet Take 1 tablet (40 mg total) by mouth 2 (two) times daily.   promethazine (PHENERGAN) 25 MG  suppository Place 1 suppository (25 mg total) rectally every 8 (eight) hours as needed for nausea or vomiting.   rosuvastatin (CRESTOR) 20 MG tablet Take 1 tablet by mouth once daily   sertraline (ZOLOFT) 100 MG tablet TAKE 2 TABLETS BY MOUTH AT BEDTIME   spironolactone (ALDACTONE) 25 MG tablet Take 25 mg by mouth daily.   tiZANidine (ZANAFLEX) 4 MG tablet Take 1 tablet (4 mg total) by mouth every 8 (eight) hours as needed for muscle spasms.   zolpidem (AMBIEN CR) 6.25 MG CR tablet Take 1 tablet (6.25 mg total) by mouth at bedtime as needed for sleep.   [DISCONTINUED] fluticasone-salmeterol (ADVAIR HFA) 230-21 MCG/ACT inhaler Inhale 2 puffs into the lungs 2 (two) times daily.   No facility-administered encounter medications on file as of 09/14/2023.    Allergies as of 09/14/2023 - Review Complete 09/14/2023  Allergen Reaction Noted   Reglan [metoclopramide] Other (See Comments) 06/04/2014    Past Medical History:  Diagnosis Date   Abnormal UGI series    Acute pain of right shoulder due to trauma 10/05/2018   Acute respiratory failure with hypoxia and hypercapnia (HCC) 10/23/2018   Allergy    Anemia    Anxiety    Arthritis    Asthma    Chest pain of uncertain etiology 03/22/2019   Chronic back pain    Chronic chest pain    resolved, no problems since 2019 per patient 07/27/19   Coffee ground vomiting 10/14/2021   COPD (chronic obstructive pulmonary disease) (HCC)    inhaler   Depression    DM (diabetes mellitus) (HCC)    INSULIN DEPENDENT - TYPE 1   Dyspnea    GERD (gastroesophageal reflux disease)    Headache(784.0)    History of hiatal hernia    Hyperlipidemia    no med, diet controlled   Hypertension    Hypokalemia    Klebsiella cystitis 03/14/2017   Last Assessment & Plan:   Formatting of this note might be different from the original.  - On Zosyn  - Urine culture: Klebsiella pneumoniae   Neuromuscular disorder (HCC)    Osteoarthritis    Oxygen deficiency     Pneumonia    Respiratory disease 05/2017   uses inhalers, neb tx prn, no oxygen   Rotator cuff tear 07/28/2019   Sleep apnea    does not use CPAP due to trach, uses 2L supplement oxygen at night   Status post tracheostomy (HCC) 03/14/2017   Last Assessment & Plan:  Formatting of this note might be different from the original. - VERY CRITICAL AIRWAY WITH EXTREME PRECUATION - On tach - ENT following along (please see above for current trach management plan)   Tracheostomy in place [facility]) 02/2017    Past Surgical History:  Procedure Laterality Date   ABDOMINAL HYSTERECTOMY  2009   APPENDECTOMY     BIOPSY  10/15/2021   Procedure:  BIOPSY;  Surgeon: Tressia Danas, MD;  [facility]: [facility] ENDOSCOPY;  Service: Gastroenterology;;   CESAREAN SECTION     x 3   CHOLECYSTECTOMY N/A 03/02/2014   Procedure: LAPAROSCOPIC CHOLECYSTECTOMY;  Surgeon: Clovis Pu. Cornett, MD;  Location: [facility] OR;  Service: General;  Laterality: N/A;   ESOPHAGOGASTRODUODENOSCOPY (EGD) WITH PROPOFOL N/A 10/15/2021   Procedure: ESOPHAGOGASTRODUODENOSCOPY (EGD) WITH PROPOFOL;  Surgeon: Tressia Danas, MD;  Location: [facility] ENDOSCOPY;  Service: Gastroenterology;  Laterality: N/A;   HERNIA REPAIR     PANENDOSCOPY N/A 03/04/2017   Procedure: PANENDOSCOPY WITH POSSIBLE FOREIGN BODY REMOVAL;  Surgeon: Flo Shanks, MD;  [facility];  Service: ENT;  Laterality: N/A;   ROTATOR CUFF REPAIR Left    ROTATOR CUFF REPAIR Right 07/27/2019   SHOULDER ARTHROSCOPY WITH SUBACROMIAL DECOMPRESSION, ROTATOR CUFF REPAIR AND BICEP TENDON REPAIR Right 07/28/2019   Procedure: RIGHT SHOULDER ARTHROSCOP, MINI OPEN ROTATOR CUFF TEAR REPAIR,  BICEPS TENODESIS, DISTAL CLAVICLE EXCISION;  Surgeon: Cammy Copa, MD;  Location: [facility] OR;  Service: Orthopedics;  Laterality: Right;   SPINE SURGERY     TOOTH EXTRACTION N/A 02/22/2021   Procedure: DENTAL RESTORATION/EXTRACTIONS;  Surgeon: Ocie Doyne, DMD;  Location: [facility] OR;  Service: Oral Surgery;   Laterality: N/A;   TRACHEOSTOMY  02/2017   TUBAL LIGATION     VESICOVAGINAL FISTULA CLOSURE W/ TAH  2009    Family History  Problem Relation Age of Onset   Heart attack Mother    Stroke Mother    Diabetes Mother    Hypertension Mother    Arthritis Mother    Stroke Father    Asthma Sister    Hypertension Sister    Asthma Sister    Diabetes Sister    Asthma Brother    Seizures Brother    Asthma Brother    Diabetes Brother    Asthma Daughter    Asthma Son    Asthma Grandson    Colon cancer Neg Hx    Rectal cancer Neg Hx    Stomach cancer Neg Hx    Esophageal cancer Neg Hx    Ovarian cancer Neg Hx    Pancreatic cancer Neg Hx     Social History   Socioeconomic History   Marital status: Married    Spouse name: Hydrologist   Number of children: Not on file   Years of education: Not on file   Highest education level: Not on file  Occupational History   Not on file  Tobacco Use   Smoking status: Former    Current packs/day: 0.00    Average packs/day: 0.5 packs/day for 25.0 years (12.5 ttl pk-yrs)    Types: Cigarettes    Start date: 01/21/1991    Quit date: 01/21/2016    Years since quitting: 7.6   Smokeless tobacco: Never  Vaping Use   Vaping status: Never Used  Substance and Sexual Activity   Alcohol use: Yes    Alcohol/week: 1.0 standard drink of alcohol    Types: 1 Glasses of wine per week    Comment: social wine   Drug use: Never    Types: Marijuana    Comment: couple times a month, Last use was on   Sexual activity: Yes    Partners: Male    Birth control/protection: Surgical    Comment: Hysterectomy  Other Topics Concern   Not on file  Social History Narrative   Lives with husband   Social Drivers of Health   Financial Resource Strain: Medium Risk (06/06/2021)  Overall Financial Resource Strain (CARDIA)    Difficulty of Paying Living Expenses: Somewhat hard  Food Insecurity: No Food Insecurity (06/28/2023)   Hunger Vital Sign    Worried About Running  Out of Food in the Last Year: Never true    Ran Out of Food in the Last Year: Never true  Transportation Needs: No Transportation Needs (07/03/2023)   PRAPARE - Administrator, Civil Service (Medical): No    Lack of Transportation (Non-Medical): No  Physical Activity: Inactive (02/18/2023)   Exercise Vital Sign    Days of Exercise per Week: 0 days    Minutes of Exercise per Session: 0 min  Stress: Stress Concern Present (02/18/2023)   [employer] of Occupational Health - Occupational Stress Questionnaire    Feeling of Stress : Rather much  Social Connections: Moderately Isolated (02/18/2023)   Social Connection and Isolation Panel [NHANES]    Frequency of Communication with Friends and Family: More than three times a week    Frequency of Social Gatherings with Friends and Family: More than three times a week    Attends Religious Services: Never    Database administrator or Organizations: No    Attends Banker Meetings: Never    Marital Status: Married  Catering manager Violence: Not At Risk (06/28/2023)   Humiliation, Afraid, Rape, and Kick questionnaire    Fear of Current or Ex-Partner: No    Emotionally Abused: No    Physically Abused: No    Sexually Abused: No    Review of Systems  Respiratory:  Positive for shortness of breath.     Vitals:   09/14/23 1507  BP: 114/60  Pulse: (!) 118  SpO2: 93%     Physical Exam Constitutional:      Appearance: She is obese.  HENT:     Head: Normocephalic.     Nose: Nose normal.     Mouth/Throat:     Mouth: Mucous membranes are moist.  Eyes:     General: No scleral icterus.    Pupils: Pupils are equal, round, and reactive to light.  Neck:     Comments: Tracheostomy tube in place Cardiovascular:     Rate and Rhythm: Normal rate and regular rhythm.     Heart sounds: No murmur heard.    No friction rub.  Pulmonary:     Effort: No respiratory distress.     Breath sounds: No stridor. No wheezing or  rhonchi.  Musculoskeletal:     Cervical back: No rigidity or tenderness.  Neurological:     Mental Status: She is alert.  Psychiatric:        Mood and Affect: Mood normal.      Data Reviewed: Overnight oximetry reviewed showing no significant desaturations  Assessment:  Chronic tracheostomy status  Chronic respiratory failure  Class III obesity  History of chronic diastolic heart failure  Has history of obstructive sleep apnea  Severe persistent asthma  Diabetes  Nonrestrictive sleep  Insomnia  Plan/Recommendations: Continue with nasal Valsalva  -Overnight oximetry reviewed showing adequate saturations  History of obstructive sleep apnea -Can continue with a tracheostomy  She is tracheostomy dependent  Severe persistent asthma -Continue Dulera  Tentative follow-up in about 3 months  Encouraged to call with significant concerns  For the insomnia we will send in a prescription for Ambien 6.25 mg, dose can be walked up as tolerated   Virl Diamond MD [facility] 09/14/2023, 4:50 PM  CC: Newlin,  Odette Horns, MD

## 2023-09-15 DIAGNOSIS — Z79899 Other long term (current) drug therapy: Secondary | ICD-10-CM | POA: Diagnosis not present

## 2023-09-15 DIAGNOSIS — M5416 Radiculopathy, lumbar region: Secondary | ICD-10-CM | POA: Diagnosis not present

## 2023-09-15 DIAGNOSIS — Z930 Tracheostomy status: Secondary | ICD-10-CM | POA: Diagnosis not present

## 2023-09-15 DIAGNOSIS — Z0189 Encounter for other specified special examinations: Secondary | ICD-10-CM | POA: Diagnosis not present

## 2023-09-15 DIAGNOSIS — R030 Elevated blood-pressure reading, without diagnosis of hypertension: Secondary | ICD-10-CM | POA: Diagnosis not present

## 2023-09-15 DIAGNOSIS — E119 Type 2 diabetes mellitus without complications: Secondary | ICD-10-CM | POA: Diagnosis not present

## 2023-09-17 ENCOUNTER — Telehealth: Payer: Self-pay

## 2023-09-17 NOTE — Telephone Encounter (Signed)
*  Pulm  Pharmacy Patient Advocate Encounter   Received notification from CoverMyMeds that prior authorization for Zolpidem Tartrate ER 6.25MG  er tablets  is required/requested.   Insurance verification completed.   The patient is insured through [facility] .   Per test claim: PA required; PA submitted to above mentioned insurance via CoverMyMeds Key/confirmation #/EOC [id number] Status is pending

## 2023-09-18 NOTE — Telephone Encounter (Signed)
Pharmacy Patient Advocate Encounter  Received notification from [facility] that Prior Authorization for Zolpidem Tartrate ER 6.25MG  er tablets  has been DENIED.  Full denial letter will be uploaded to the media tab. See denial reason below.   PA #/Case ID/Reference #: ZOLPIDEM ER TAB 6.25MG  is denied because it is not on your plan's Drug List (formulary). Medication authorization requires the following: (1) You need to try Zolpidem tablet and three (3) of these covered drugs: (a) Belsomra. (b) Eszopiclone. (c) Quviviq. (d) Ramelteon. (e) Zaleplon. (2) OR your doctor needs to give [place] specific medical reasons why Zolpidem tablet and three (3) of the covered drug(s) are not appropriate for you.

## 2023-09-19 NOTE — Telephone Encounter (Signed)
Message routed to Dr. Wynona Neat as [place]ain Childes.

## 2023-09-24 ENCOUNTER — Encounter: Payer: Self-pay | Admitting: Family Medicine

## 2023-09-24 ENCOUNTER — Telehealth: Payer: 59 | Admitting: Family Medicine

## 2023-09-24 ENCOUNTER — Encounter: Payer: Self-pay | Admitting: Pulmonary Disease

## 2023-09-24 DIAGNOSIS — K3184 Gastroparesis: Secondary | ICD-10-CM | POA: Diagnosis not present

## 2023-09-24 DIAGNOSIS — Z7985 Long-term (current) use of injectable non-insulin antidiabetic drugs: Secondary | ICD-10-CM

## 2023-09-24 DIAGNOSIS — E1069 Type 1 diabetes mellitus with other specified complication: Secondary | ICD-10-CM

## 2023-09-24 DIAGNOSIS — Z794 Long term (current) use of insulin: Secondary | ICD-10-CM | POA: Diagnosis not present

## 2023-09-24 MED ORDER — TRESIBA FLEXTOUCH 200 UNIT/ML ~~LOC~~ SOPN
185.0000 [IU] | PEN_INJECTOR | Freq: Every day | SUBCUTANEOUS | 3 refills | Status: DC
Start: 2023-09-24 — End: 2024-03-29

## 2023-09-24 MED ORDER — TIRZEPATIDE 2.5 MG/0.5ML ~~LOC~~ SOAJ: 2.5000 mg | SUBCUTANEOUS | 1 refills | Status: DC

## 2023-09-24 NOTE — Progress Notes (Signed)
 Virtual Visit via Video Note  I connected with [name], on 09/24/2023 at 8:19 AM by video enabled telemedicine device and verified that I am speaking with the correct person using two identifiers.   Consent: I discussed the limitations, risks, security and privacy concerns of performing an evaluation and management service by telemedicine and the availability of in person appointments. I also discussed with the patient that there may be a patient responsible charge related to this service. The patient expressed understanding and agreed to proceed.   Location of Patient: Home  Location of Provider: Clinic   Persons participating in Telemedicine visit: [name] Dr. Delbert     History of Present Illness: [name] is a 53 y.o. year old female  with a history of type 1 diabetes mellitus (A1c 8.9), hypertension, obstructive sleep apnea, asthma, depression, s/p tracheostomy secondary to intubation related tracheal injury, subglottic stenosis (status post balloon dilatation and Kenalog  injection of stenosis tracheostomy tube exchange by ENT, status post right rotator cuff surgery in 07/2019. Uses 2L oxygen  at night, s/p R L4L5 transforaminal lumbar interbody fusion with rods, screws and cage, local bone graft, allogen bone graft, vivigen in 09/2021.   Discussed the use of AI scribe software for clinical note transcription with the patient, who gave verbal consent to proceed.  She presents with concerns about consistently high blood sugar levels. She reports that her blood sugar has been over 400 for at least two weeks. The patient is unsure if the high blood sugar levels are due to the steroids she was recently on. She completed the steroids about a week after being discharged from the hospital.  She would like to be placed back on a GLP-1 RA as she states it did help her sugar in the past.  This had been discontinued previously due to  her gastroparesis and she states she had GI symptoms prior to initiation of GLP-1 RA and would like to get back on it.     Past Medical History:  Diagnosis Date   Abnormal UGI series    Acute pain of right shoulder due to trauma 10/05/2018   Acute respiratory failure with hypoxia and hypercapnia (HCC) 10/23/2018   Allergy    Anemia    Anxiety    Arthritis    Asthma    Chest pain of uncertain etiology 03/22/2019   Chronic back pain    Chronic chest pain    resolved, no problems since 2019 per patient 07/27/19   Coffee ground vomiting 10/14/2021   COPD (chronic obstructive pulmonary disease) (HCC)    inhaler   Depression    DM (diabetes mellitus) (HCC)    INSULIN  DEPENDENT - TYPE 1   Dyspnea    GERD (gastroesophageal reflux disease)    Headache(784.0)    History of hiatal hernia    Hyperlipidemia    no med, diet controlled   Hypertension    Hypokalemia    Klebsiella cystitis 03/14/2017   Last Assessment & Plan:   Formatting of this note might be different from the original.  - On Zosyn  - Urine culture: Klebsiella pneumoniae   Neuromuscular disorder (HCC)    Osteoarthritis    Oxygen  deficiency    Pneumonia    Respiratory disease 05/2017   uses inhalers, neb tx prn, no oxygen    Rotator cuff tear 07/28/2019   Sleep apnea    does not use CPAP due to trach, uses 2L supplement oxygen  at night   Status  post tracheostomy (HCC) 03/14/2017   Last Assessment & Plan:  Formatting of this note might be different from the original. - VERY CRITICAL AIRWAY WITH EXTREME PRECUATION - On tach - ENT following along (please see above for current trach management plan)   Tracheostomy in place [facility]) 02/2017   Allergies  Allergen Reactions   Reglan  [Metoclopramide ] Other (See Comments)    Panic attack    Current Outpatient Medications on File Prior to Visit  Medication Sig Dispense Refill   albuterol  (PROVENTIL ) (2.5 MG/3ML) 0.083% nebulizer solution Take 3 mLs (2.5 mg total) by  nebulization every 6 (six) hours as needed for wheezing or shortness of breath. 75 mL 12   albuterol  (VENTOLIN  HFA) 108 (90 Base) MCG/ACT inhaler Inhale 2 puffs into the lungs every 6 (six) hours as needed for wheezing or shortness of breath. 18 g 6   amitriptyline  (ELAVIL ) 10 MG tablet Take 1 tablet (10 mg total) by mouth at bedtime. 30 tablet 0   amLODipine  (NORVASC ) 10 MG tablet Take 10 mg by mouth daily.     Blood Glucose Monitoring Suppl (ONETOUCH VERIO FLEX SYSTEM) w/Device KIT Use to check glucose levels daily 1 kit 0   buPROPion  (WELLBUTRIN  SR) 150 MG 12 hr tablet Take 1 tablet (150 mg total) by mouth 2 (two) times daily. 180 tablet 1   cetirizine  (ZYRTEC ) 10 MG tablet TAKE 1 TABLET BY MOUTH AT BEDTIME 30 tablet 0   Continuous Glucose Receiver (FREESTYLE LIBRE 2 READER) DEVI USE TO CHECK BLOOD SUGAR THREE TIMES DAILY. 1 each 0   Continuous Glucose Sensor (FREESTYLE LIBRE 2 SENSOR) MISC USE TO CHECK BLOOD GLUCOSE THREE TIMES DAILY.  CHANGE SENSOR ONCE EVERY 14 DAYS. 6 each 0   fluconazole  (DIFLUCAN ) 150 MG tablet TAKE 1 TABLET BY MOUTH AS A ONE TIME DOSE. 1 tablet 0   furosemide  (LASIX ) 40 MG tablet Take 1 tablet (40 mg total) by mouth daily as needed for fluid or edema. 30 tablet 1   gabapentin  (NEURONTIN ) 600 MG tablet Take 1 tablet (600 mg total) by mouth 3 (three) times daily. 90 tablet 3   glucose blood (ONETOUCH VERIO) test strip Use to check glucose levels three times daily; E11.9 100 each 6   guaiFENesin  (ROBITUSSIN) 100 MG/5ML liquid Take 5 mLs by mouth every 6 (six) hours as needed for cough or to loosen phlegm. 118 mL 0   hydrOXYzine  (ATARAX ) 25 MG tablet Take 1 tablet (25 mg total) by mouth 3 (three) times daily as needed for anxiety. 21 tablet 0   insulin  degludec (TRESIBA  FLEXTOUCH) 200 UNIT/ML FlexTouch Pen Inject 170 Units into the skin daily. 30 mL 3   insulin  lispro (HUMALOG  KWIKPEN) 100 UNIT/ML KwikPen Inject 10 Units into the skin with breakfast, with lunch, and with  evening meal. Continue to hold if not eating majority of meals     Insulin  Pen Needle 31G X 8 MM MISC Use to inject Tresiba  and Humalog . Pt may use 4 pen needles daily. 100 each 11   lisinopril  (ZESTRIL ) 20 MG tablet Take 1 tablet (20 mg total) by mouth daily. 90 tablet 1   Misc. Devices MISC Shower chair 1 each 0   Misc. Devices MISC Nebulizer device. Diagnosis - Asthma 1 each 0   mometasone -formoterol  (DULERA ) 200-5 MCG/ACT AERO Inhale 2 puffs into the lungs in the morning and at bedtime. 1 each 6   montelukast  (SINGULAIR ) 10 MG tablet Take 1 tablet (10 mg total) by mouth at bedtime. 90 tablet 1  ondansetron  (ZOFRAN ) 4 MG tablet TAKE 1 TABLET BY MOUTH EVERY 8 HOURS AS NEEDED FOR NAUSEA FOR VOMITING 30 tablet 0   ondansetron  (ZOFRAN -ODT) 4 MG disintegrating tablet Take 1 tablet (4 mg total) by mouth every 8 (eight) hours as needed for nausea or vomiting. 30 tablet 0   OneTouch Delica Lancets 33G MISC Use to check glucose levels daily; E11.9 100 each 3   oxyCODONE -acetaminophen  (PERCOCET) 10-325 MG tablet Take 1 tablet by mouth 4 (four) times daily as needed for pain.     pantoprazole  (PROTONIX ) 40 MG tablet Take 1 tablet (40 mg total) by mouth 2 (two) times daily. 180 tablet 1   promethazine  (PHENERGAN ) 25 MG suppository Place 1 suppository (25 mg total) rectally every 8 (eight) hours as needed for nausea or vomiting. 20 each 0   rosuvastatin  (CRESTOR ) 20 MG tablet Take 1 tablet by mouth once daily 90 tablet 0   sertraline  (ZOLOFT ) 100 MG tablet TAKE 2 TABLETS BY MOUTH AT BEDTIME 180 tablet 1   spironolactone  (ALDACTONE ) 25 MG tablet Take 25 mg by mouth daily.     tiZANidine  (ZANAFLEX ) 4 MG tablet Take 1 tablet (4 mg total) by mouth every 8 (eight) hours as needed for muscle spasms. 90 tablet 3   zolpidem  (AMBIEN  CR) 6.25 MG CR tablet Take 1 tablet (6.25 mg total) by mouth at bedtime as needed for sleep. 30 tablet 0   [DISCONTINUED] fluticasone -salmeterol (ADVAIR HFA) 230-21 MCG/ACT inhaler  Inhale 2 puffs into the lungs 2 (two) times daily. 1 Inhaler 3   No current facility-administered medications on file prior to visit.    ROS: See HPI  Observations/Objective: Awake, alert, oriented x3 Not in acute distress Normal mood      Latest Ref Rng & Units 07/21/2023    4:27 AM 07/20/2023    4:34 AM 07/18/2023   11:17 PM  CMP  Glucose 70 - 99 mg/dL 717  747  734   BUN 6 - 20 mg/dL 14  17  20    Creatinine 0.44 - 1.00 mg/dL 8.80  8.84  7.99   Sodium 135 - 145 mmol/L 138  136  142   Potassium 3.5 - 5.1 mmol/L 4.5  4.3  3.9   Chloride 98 - 111 mmol/L 105  102  104   CO2 22 - 32 mmol/L 23  24    Calcium  8.9 - 10.3 mg/dL 9.7  9.7      Lipid Panel     Component Value Date/Time   CHOL 144 10/09/2022 1046   TRIG 124 10/09/2022 1046   HDL 54 10/09/2022 1046   CHOLHDL 3.2 06/15/2018 1031   CHOLHDL 3.9 02/22/2016 0954   VLDL 26 02/22/2016 0954   LDLCALC 68 10/09/2022 1046   LABVLDL 22 10/09/2022 1046    Lab Results  Component Value Date   HGBA1C 8.9 (H) 06/28/2023     Assessment and Plan:     Uncontrolled Diabetes Mellitus Persistent hyperglycemia with readings over 400. Recent steroid use may have contributed to elevated glucose levels. Last A1c in October was 8.9, but current readings suggest it may now be higher. -Increase Tresiba  from 170 units to 185 units daily. -Continue Humalog  20 units TID with meals. -If hypoglycemia occurs, decrease Tresiba  by 2 units until sugars stabilize. -Start Mounjaro  0.25 weekly, monitor for tolerance and potential increase after 4 weeks. -Continue glucose monitoring and report any significant changes.  Gastroparesis Reports of ongoing stomach issues including episodes of nausea and inability to eat. Previous  use of Mounjaro  may have exacerbated symptoms. -Monitor symptoms while on low dose Mounjaro  as she is insistent on being prescribed a GLP-1 RA. -Report any worsening of symptoms.        Follow Up Instructions: Keep  previously scheduled appointment   I discussed the assessment and treatment plan with the patient. The patient was provided an opportunity to ask questions and all were answered. The patient agreed with the plan and demonstrated an understanding of the instructions.   The patient was advised to call back or seek an in-person evaluation if the symptoms worsen or if the condition fails to improve as anticipated.     I provided 12 minutes total of Telehealth time during this encounter including median intraservice time, reviewing previous notes, investigations, ordering medications, medical decision making, coordinating care and patient verbalized understanding at the end of the visit.     Corrina Sabin, MD, FAAFP. [facility] [place], [place] [phone]   09/24/2023, 8:19 AM

## 2023-09-28 ENCOUNTER — Encounter: Payer: Self-pay | Admitting: Pulmonary Disease

## 2023-09-28 ENCOUNTER — Telehealth: Payer: Self-pay | Admitting: Primary Care

## 2023-09-28 ENCOUNTER — Other Ambulatory Visit: Payer: Self-pay | Admitting: Pulmonary Disease

## 2023-09-28 MED ORDER — ZOLPIDEM TARTRATE 5 MG PO TABS: 5.0000 mg | ORAL_TABLET | Freq: Every evening | ORAL | 0 refills | Status: DC | PRN

## 2023-09-28 NOTE — Progress Notes (Unsigned)
 Ambien called to pharmacy in place of Ambien CR

## 2023-09-28 NOTE — Telephone Encounter (Signed)
 ONO 07/22/2023 on 3L showed SpO2 low 91%, baseline 96%.  Patient spent 0 minutes with oxygen level less than 88%.  No changes recommended

## 2023-10-01 ENCOUNTER — Other Ambulatory Visit: Payer: Self-pay | Admitting: Family Medicine

## 2023-10-01 MED ORDER — INSULIN PEN NEEDLE 31G X 8 MM MISC: 11 refills | Status: AC

## 2023-10-06 ENCOUNTER — Encounter: Payer: Self-pay | Admitting: Primary Care

## 2023-10-07 DIAGNOSIS — G4733 Obstructive sleep apnea (adult) (pediatric): Secondary | ICD-10-CM | POA: Diagnosis not present

## 2023-10-07 DIAGNOSIS — J45909 Unspecified asthma, uncomplicated: Secondary | ICD-10-CM | POA: Diagnosis not present

## 2023-10-07 DIAGNOSIS — J9610 Chronic respiratory failure, unspecified whether with hypoxia or hypercapnia: Secondary | ICD-10-CM | POA: Diagnosis not present

## 2023-10-07 DIAGNOSIS — J449 Chronic obstructive pulmonary disease, unspecified: Secondary | ICD-10-CM | POA: Diagnosis not present

## 2023-10-10 ENCOUNTER — Other Ambulatory Visit: Payer: Self-pay | Admitting: Family Medicine

## 2023-10-10 ENCOUNTER — Other Ambulatory Visit: Payer: Self-pay | Admitting: Orthopedic Surgery

## 2023-10-10 DIAGNOSIS — E109 Type 1 diabetes mellitus without complications: Secondary | ICD-10-CM

## 2023-10-10 DIAGNOSIS — E119 Type 2 diabetes mellitus without complications: Secondary | ICD-10-CM

## 2023-10-11 DIAGNOSIS — J4540 Moderate persistent asthma, uncomplicated: Secondary | ICD-10-CM | POA: Diagnosis not present

## 2023-10-15 DIAGNOSIS — G4733 Obstructive sleep apnea (adult) (pediatric): Secondary | ICD-10-CM | POA: Diagnosis not present

## 2023-10-15 DIAGNOSIS — M5416 Radiculopathy, lumbar region: Secondary | ICD-10-CM | POA: Diagnosis not present

## 2023-10-15 DIAGNOSIS — E119 Type 2 diabetes mellitus without complications: Secondary | ICD-10-CM | POA: Diagnosis not present

## 2023-10-15 DIAGNOSIS — R030 Elevated blood-pressure reading, without diagnosis of hypertension: Secondary | ICD-10-CM | POA: Diagnosis not present

## 2023-10-15 DIAGNOSIS — Z930 Tracheostomy status: Secondary | ICD-10-CM | POA: Diagnosis not present

## 2023-10-15 DIAGNOSIS — Z79899 Other long term (current) drug therapy: Secondary | ICD-10-CM | POA: Diagnosis not present

## 2023-10-15 DIAGNOSIS — J449 Chronic obstructive pulmonary disease, unspecified: Secondary | ICD-10-CM | POA: Diagnosis not present

## 2023-10-15 DIAGNOSIS — J45909 Unspecified asthma, uncomplicated: Secondary | ICD-10-CM | POA: Diagnosis not present

## 2023-10-28 ENCOUNTER — Other Ambulatory Visit: Payer: Self-pay | Admitting: Pulmonary Disease

## 2023-10-28 ENCOUNTER — Other Ambulatory Visit: Payer: Self-pay | Admitting: Family Medicine

## 2023-10-28 DIAGNOSIS — J386 Stenosis of larynx: Secondary | ICD-10-CM | POA: Diagnosis not present

## 2023-10-28 DIAGNOSIS — Z930 Tracheostomy status: Secondary | ICD-10-CM | POA: Diagnosis not present

## 2023-10-28 DIAGNOSIS — J3802 Paralysis of vocal cords and larynx, bilateral: Secondary | ICD-10-CM | POA: Diagnosis not present

## 2023-10-28 DIAGNOSIS — J4550 Severe persistent asthma, uncomplicated: Secondary | ICD-10-CM

## 2023-10-28 DIAGNOSIS — R112 Nausea with vomiting, unspecified: Secondary | ICD-10-CM

## 2023-10-28 DIAGNOSIS — J4540 Moderate persistent asthma, uncomplicated: Secondary | ICD-10-CM | POA: Diagnosis not present

## 2023-10-28 DIAGNOSIS — J0410 Acute tracheitis without obstruction: Secondary | ICD-10-CM | POA: Diagnosis not present

## 2023-10-30 ENCOUNTER — Other Ambulatory Visit: Payer: Self-pay | Admitting: Pulmonary Disease

## 2023-10-30 MED ORDER — ZOLPIDEM TARTRATE 5 MG PO TABS: 5.0000 mg | ORAL_TABLET | Freq: Every evening | ORAL | 0 refills | Status: DC | PRN

## 2023-10-30 NOTE — Progress Notes (Signed)
 Discontinue Ambien  6.25 mg

## 2023-11-07 DIAGNOSIS — J45909 Unspecified asthma, uncomplicated: Secondary | ICD-10-CM | POA: Diagnosis not present

## 2023-11-07 DIAGNOSIS — J9610 Chronic respiratory failure, unspecified whether with hypoxia or hypercapnia: Secondary | ICD-10-CM | POA: Diagnosis not present

## 2023-11-07 DIAGNOSIS — J449 Chronic obstructive pulmonary disease, unspecified: Secondary | ICD-10-CM | POA: Diagnosis not present

## 2023-11-07 DIAGNOSIS — G4733 Obstructive sleep apnea (adult) (pediatric): Secondary | ICD-10-CM | POA: Diagnosis not present

## 2023-11-11 ENCOUNTER — Other Ambulatory Visit: Payer: Self-pay | Admitting: Family Medicine

## 2023-11-11 ENCOUNTER — Other Ambulatory Visit: Payer: Self-pay | Admitting: Pulmonary Disease

## 2023-11-11 DIAGNOSIS — J4540 Moderate persistent asthma, uncomplicated: Secondary | ICD-10-CM | POA: Diagnosis not present

## 2023-11-11 DIAGNOSIS — K219 Gastro-esophageal reflux disease without esophagitis: Secondary | ICD-10-CM

## 2023-11-12 DIAGNOSIS — R5383 Other fatigue: Secondary | ICD-10-CM | POA: Diagnosis not present

## 2023-11-12 DIAGNOSIS — Z79899 Other long term (current) drug therapy: Secondary | ICD-10-CM | POA: Diagnosis not present

## 2023-11-12 DIAGNOSIS — R030 Elevated blood-pressure reading, without diagnosis of hypertension: Secondary | ICD-10-CM | POA: Diagnosis not present

## 2023-11-12 DIAGNOSIS — M5416 Radiculopathy, lumbar region: Secondary | ICD-10-CM | POA: Diagnosis not present

## 2023-11-23 ENCOUNTER — Encounter: Payer: Self-pay | Admitting: Pharmacist

## 2023-11-23 ENCOUNTER — Other Ambulatory Visit (HOSPITAL_BASED_OUTPATIENT_CLINIC_OR_DEPARTMENT_OTHER): Admitting: Pharmacist

## 2023-11-23 DIAGNOSIS — E1022 Type 1 diabetes mellitus with diabetic chronic kidney disease: Secondary | ICD-10-CM

## 2023-11-23 DIAGNOSIS — N1831 Chronic kidney disease, stage 3a: Secondary | ICD-10-CM

## 2023-11-23 MED ORDER — TIRZEPATIDE 5 MG/0.5ML ~~LOC~~ SOAJ: 5.0000 mg | SUBCUTANEOUS | 1 refills | Status: DC

## 2023-11-23 NOTE — Progress Notes (Signed)
 Pharmacy TNM Diabetes Measure Review  S:  Patient was identified in a report as being at risk for failing the True [place] Metric of A1c control (<8%) in [place] and African American patients. Last A1c was 8.9. Last PCP visit was via video on 09/24/2023.  Call placed to patient to discuss diabetes control and medication management.   Current diabetes medications include: Humalog 20u TID before meals, Tresiba 170u daily, Mounjaro 2.5 mg weekly Patient reports adherence to taking all medications as prescribed. Denies any NV, abdominal pain, or changes in vision.   Insurance coverage: [employer]  Patient denies hypoglycemic events.  Reported home fasting blood sugars: "350s +".  Patient reported dietary habits: none reported   Patient-reported exercise habits: none reported   O:   Lab Results  Component Value Date   HGBA1C 8.9 (H) 06/28/2023   There were no vitals filed for this visit.  Lipid Panel     Component Value Date/Time   CHOL 144 10/09/2022 1046   TRIG 124 10/09/2022 1046   HDL 54 10/09/2022 1046   CHOLHDL 3.2 06/15/2018 1031   CHOLHDL 3.9 02/22/2016 0954   VLDL 26 02/22/2016 0954   LDLCALC 68 10/09/2022 1046    Clinical Atherosclerotic Cardiovascular Disease (ASCVD): No  The 10-year ASCVD risk score (Arnett DK, et al., 2019) is: 13.7%   Values used to calculate the score:     Age: 53 years     Sex: Female     Is Non-Hispanic African American: Yes     Diabetic: Yes     Tobacco smoker: No     Systolic Blood Pressure: 149 mmHg     Is BP treated: Yes     HDL Cholesterol: 54 mg/dL     Total Cholesterol: 144 mg/dL   Patient is participating in a Managed Medicaid Plan: No   A/P: Diabetes longstanding currently uncontrolled. Has been managed by Endo in the past. Since Dr. Everardo All left [facility], pt's DM has been primarily managed by [place]. Reported home sugars levels are still very high. Dr. Alvis Lemmings added back [facility] in January and the pt is tolerating this well.  Denies any NV, abdominal pain. We will go ahead and increase gradually given her hx of gastroparesis. She tried GLP-1 therapy in the past but had to stop. She affirms today that her GI symptoms were present prior to starting [facility] and that the medication did not worsen symptoms.S  -Increased dose of Mounjaro to 5 mg weekly.  -Continue current doses of insulin.  -Patient educated on purpose, proper use, and potential adverse effects of Mounjaro.  -Extensively discussed pathophysiology of diabetes, recommended lifestyle interventions, dietary effects on blood sugar control.  -Counseled on s/sx of and management of hypoglycemia.  -Next A1c anticipated 12/2023.   Follow-up:  Pharmacist 12/24/2023.  Butch Penny, PharmD, Patsy [place], CPP Clinical Pharmacist [facility] [phone]

## 2023-11-24 ENCOUNTER — Other Ambulatory Visit: Payer: Self-pay | Admitting: Pulmonary Disease

## 2023-11-26 ENCOUNTER — Ambulatory Visit: Payer: 59 | Admitting: Pulmonary Disease

## 2023-11-27 ENCOUNTER — Other Ambulatory Visit: Payer: Self-pay | Admitting: Family Medicine

## 2023-11-27 ENCOUNTER — Encounter: Payer: Self-pay | Admitting: Family Medicine

## 2023-11-27 ENCOUNTER — Other Ambulatory Visit: Payer: Self-pay | Admitting: Pulmonary Disease

## 2023-11-27 DIAGNOSIS — B3731 Acute candidiasis of vulva and vagina: Secondary | ICD-10-CM

## 2023-11-27 DIAGNOSIS — J4550 Severe persistent asthma, uncomplicated: Secondary | ICD-10-CM

## 2023-11-27 MED ORDER — CETIRIZINE HCL 10 MG PO TABS: 10.0000 mg | ORAL_TABLET | Freq: Every day | ORAL | 1 refills | Status: AC

## 2023-11-27 MED ORDER — FLUTICASONE PROPIONATE 50 MCG/ACT NA SUSP: 2.0000 | Freq: Every day | NASAL | 1 refills | Status: AC

## 2023-12-05 DIAGNOSIS — J9610 Chronic respiratory failure, unspecified whether with hypoxia or hypercapnia: Secondary | ICD-10-CM | POA: Diagnosis not present

## 2023-12-05 DIAGNOSIS — G4733 Obstructive sleep apnea (adult) (pediatric): Secondary | ICD-10-CM | POA: Diagnosis not present

## 2023-12-05 DIAGNOSIS — J449 Chronic obstructive pulmonary disease, unspecified: Secondary | ICD-10-CM | POA: Diagnosis not present

## 2023-12-05 DIAGNOSIS — J45909 Unspecified asthma, uncomplicated: Secondary | ICD-10-CM | POA: Diagnosis not present

## 2023-12-09 ENCOUNTER — Other Ambulatory Visit: Payer: Self-pay | Admitting: Family Medicine

## 2023-12-09 ENCOUNTER — Other Ambulatory Visit: Payer: Self-pay | Admitting: Pulmonary Disease

## 2023-12-09 DIAGNOSIS — R112 Nausea with vomiting, unspecified: Secondary | ICD-10-CM

## 2023-12-09 DIAGNOSIS — J4540 Moderate persistent asthma, uncomplicated: Secondary | ICD-10-CM | POA: Diagnosis not present

## 2023-12-09 DIAGNOSIS — G894 Chronic pain syndrome: Secondary | ICD-10-CM

## 2023-12-10 DIAGNOSIS — R030 Elevated blood-pressure reading, without diagnosis of hypertension: Secondary | ICD-10-CM | POA: Diagnosis not present

## 2023-12-10 DIAGNOSIS — Z79899 Other long term (current) drug therapy: Secondary | ICD-10-CM | POA: Diagnosis not present

## 2023-12-10 DIAGNOSIS — M5416 Radiculopathy, lumbar region: Secondary | ICD-10-CM | POA: Diagnosis not present

## 2023-12-24 ENCOUNTER — Encounter: Payer: Self-pay | Admitting: Family Medicine

## 2023-12-24 ENCOUNTER — Encounter: Payer: Self-pay | Admitting: Pharmacist

## 2023-12-24 ENCOUNTER — Ambulatory Visit: Attending: Family Medicine | Admitting: Pharmacist

## 2023-12-24 DIAGNOSIS — E1069 Type 1 diabetes mellitus with other specified complication: Secondary | ICD-10-CM

## 2023-12-24 DIAGNOSIS — Z7985 Long-term (current) use of injectable non-insulin antidiabetic drugs: Secondary | ICD-10-CM | POA: Diagnosis not present

## 2023-12-24 DIAGNOSIS — Z794 Long term (current) use of insulin: Secondary | ICD-10-CM | POA: Diagnosis not present

## 2023-12-24 DIAGNOSIS — E119 Type 2 diabetes mellitus without complications: Secondary | ICD-10-CM

## 2023-12-24 LAB — POCT GLYCOSYLATED HEMOGLOBIN (HGB A1C): HbA1c, POC (controlled diabetic range): 11.5 % — AB (ref 0.0–7.0)

## 2023-12-24 MED ORDER — TIRZEPATIDE 7.5 MG/0.5ML ~~LOC~~ SOAJ: 7.5000 mg | SUBCUTANEOUS | 0 refills | Status: DC

## 2023-12-24 MED ORDER — FREESTYLE LIBRE 2 SENSOR MISC: 6 refills | Status: AC

## 2023-12-24 NOTE — Progress Notes (Signed)
 Pharmacy TNM Diabetes Measure Review  S: PCP: Dr. Alvis Lemmings  Patient was identified in a report as being at risk for failing the True [place] Metric of A1c control (<8%) in [place] and [place] American patients. Last PCP visit was via video on 09/24/2023 with Dr. Alvis Lemmings.  I last saw her on 11/23/2023 and increased Mounjaro dose to 5mg  weekly. Since that visit, she reports doing well. Denies any NV, abdominal pain since Mounjaro increase. Continues to be adherent to this in addition to her Humalog and [place]. She has no complaints today.  Current diabetes medications include: Humalog 20u TID before meals, Tresiba 170u daily, Mounjaro 5 mg weekly Patient reports adherence to taking all medications as prescribed.  Insurance coverage: [employer]  Patient denies hypoglycemic events.  Patient reported dietary habits: none reported   Patient-reported exercise habits: none reported   Patient reports polyuria. Patient reports blurred vision but no changes since seeing Dr. Alvis Lemmings in Jan.  Patient reports stable neuropathy. Patient reports regular foot checks.   O:  Date of Download: 12/24/2023, 7-day report % Time CGM is active: 100% Average Glucose: 203 mg/dL Glucose Management Indicator: NA  Glucose Variability: NA (goal <36%) Time in Goal:  - Time in range 70-180: 59% - Time above range: 41% - Time below range: 0%  Lab Results  Component Value Date   HGBA1C 11.5 (A) 12/24/2023   There were no vitals filed for this visit.  Lipid Panel     Component Value Date/Time   CHOL 144 10/09/2022 1046   TRIG 124 10/09/2022 1046   HDL 54 10/09/2022 1046   CHOLHDL 3.2 06/15/2018 1031   CHOLHDL 3.9 02/22/2016 0954   VLDL 26 02/22/2016 0954   LDLCALC 68 10/09/2022 1046    Clinical Atherosclerotic Cardiovascular Disease (ASCVD): No  The 10-year ASCVD risk score (Arnett DK, et al., 2019) is: 13.7%   Values used to calculate the score:     Age: 53 years     Sex: Female     Is Non-Hispanic  African American: Yes     Diabetic: Yes     Tobacco smoker: No     Systolic Blood Pressure: 149 mmHg     Is BP treated: Yes     HDL Cholesterol: 54 mg/dL     Total Cholesterol: 144 mg/dL   Patient is participating in a Managed Medicaid Plan: No   A/P: Diabetes longstanding currently uncontrolled based on A1c today. Her A1c is higher compared to her CGM report. I covered this with her and emphasized her improvement in glycemic control over the last 4 weeks. Dr. Alvis Lemmings added back [facility] in January and the pt is tolerating this well. Denies any NV, abdominal pain since increasing to the 5mg  dose and she's been taking this for ~1 month. We will go ahead and continue to increase gradually given her hx of gastroparesis.  -Increased dose of Mounjaro to 7.5 mg weekly.  -Continue current doses of insulin. As she increases the Mounjaro dose, we may need to decrease insulin. I educated her on this and instructed her to dial Tresiba back to 160 units daily should hypoglycemia occur. -Patient educated on purpose, proper use, and potential adverse effects of Mounjaro.  -Extensively discussed pathophysiology of diabetes, recommended lifestyle interventions, dietary effects on blood sugar control.  -Counseled on s/sx of and management of hypoglycemia.  -Next A1c anticipated 03/2024.   Follow-up:  Pharmacist in 1 month via telephone.  Butch Penny, PharmD, BCACP, CPP Clinical Pharmacist [facility] [phone]

## 2023-12-28 DIAGNOSIS — Z430 Encounter for attention to tracheostomy: Secondary | ICD-10-CM | POA: Diagnosis not present

## 2023-12-28 DIAGNOSIS — Z930 Tracheostomy status: Secondary | ICD-10-CM | POA: Diagnosis not present

## 2023-12-28 DIAGNOSIS — R390 Extravasation of urine: Secondary | ICD-10-CM | POA: Diagnosis not present

## 2023-12-28 DIAGNOSIS — Z794 Long term (current) use of insulin: Secondary | ICD-10-CM | POA: Diagnosis not present

## 2023-12-28 DIAGNOSIS — J386 Stenosis of larynx: Secondary | ICD-10-CM | POA: Diagnosis not present

## 2023-12-28 DIAGNOSIS — R490 Dysphonia: Secondary | ICD-10-CM | POA: Diagnosis not present

## 2023-12-28 DIAGNOSIS — E1142 Type 2 diabetes mellitus with diabetic polyneuropathy: Secondary | ICD-10-CM | POA: Diagnosis not present

## 2023-12-30 DIAGNOSIS — J45909 Unspecified asthma, uncomplicated: Secondary | ICD-10-CM | POA: Diagnosis not present

## 2023-12-30 DIAGNOSIS — G4733 Obstructive sleep apnea (adult) (pediatric): Secondary | ICD-10-CM | POA: Diagnosis not present

## 2023-12-30 DIAGNOSIS — Z930 Tracheostomy status: Secondary | ICD-10-CM | POA: Diagnosis not present

## 2023-12-30 DIAGNOSIS — J449 Chronic obstructive pulmonary disease, unspecified: Secondary | ICD-10-CM | POA: Diagnosis not present

## 2024-01-03 ENCOUNTER — Other Ambulatory Visit: Payer: Self-pay | Admitting: Pulmonary Disease

## 2024-01-03 ENCOUNTER — Encounter: Payer: Self-pay | Admitting: Pulmonary Disease

## 2024-01-04 ENCOUNTER — Other Ambulatory Visit: Payer: Self-pay | Admitting: Pulmonary Disease

## 2024-01-04 MED ORDER — ZOLPIDEM TARTRATE ER 6.25 MG PO TBCR: 6.2500 mg | EXTENDED_RELEASE_TABLET | Freq: Every evening | ORAL | 2 refills | Status: DC | PRN

## 2024-01-05 ENCOUNTER — Other Ambulatory Visit: Payer: Self-pay | Admitting: Pulmonary Disease

## 2024-01-05 ENCOUNTER — Telehealth: Payer: Self-pay

## 2024-01-05 DIAGNOSIS — J449 Chronic obstructive pulmonary disease, unspecified: Secondary | ICD-10-CM | POA: Diagnosis not present

## 2024-01-05 DIAGNOSIS — G4733 Obstructive sleep apnea (adult) (pediatric): Secondary | ICD-10-CM | POA: Diagnosis not present

## 2024-01-05 DIAGNOSIS — J9610 Chronic respiratory failure, unspecified whether with hypoxia or hypercapnia: Secondary | ICD-10-CM | POA: Diagnosis not present

## 2024-01-05 DIAGNOSIS — J45909 Unspecified asthma, uncomplicated: Secondary | ICD-10-CM | POA: Diagnosis not present

## 2024-01-05 MED ORDER — ZOLPIDEM TARTRATE 5 MG PO TABS: 5.0000 mg | ORAL_TABLET | Freq: Every evening | ORAL | 2 refills | Status: AC | PRN

## 2024-01-05 NOTE — Telephone Encounter (Signed)
 Pharmacy Patient Advocate Encounter  Received notification from [facility] that Prior Authorization for Zolpidem Tartrate ER 6.25MG  er tablets  has been CANCELLED due to previously changed to Zolpidem 5mg  due to non coverage of CR 6.25mg    PA #/Case ID/Reference #: [id number]

## 2024-01-05 NOTE — Telephone Encounter (Signed)
**Note De-identified  [place] Obfuscation** Please advise 

## 2024-01-09 DIAGNOSIS — J4540 Moderate persistent asthma, uncomplicated: Secondary | ICD-10-CM | POA: Diagnosis not present

## 2024-01-11 DIAGNOSIS — R0602 Shortness of breath: Secondary | ICD-10-CM | POA: Diagnosis not present

## 2024-01-11 DIAGNOSIS — I10 Essential (primary) hypertension: Secondary | ICD-10-CM | POA: Diagnosis not present

## 2024-01-11 DIAGNOSIS — Z1231 Encounter for screening mammogram for malignant neoplasm of breast: Secondary | ICD-10-CM | POA: Diagnosis not present

## 2024-01-11 DIAGNOSIS — M5416 Radiculopathy, lumbar region: Secondary | ICD-10-CM | POA: Diagnosis not present

## 2024-01-11 DIAGNOSIS — Z930 Tracheostomy status: Secondary | ICD-10-CM | POA: Diagnosis not present

## 2024-01-11 DIAGNOSIS — Z79899 Other long term (current) drug therapy: Secondary | ICD-10-CM | POA: Diagnosis not present

## 2024-01-11 DIAGNOSIS — G4733 Obstructive sleep apnea (adult) (pediatric): Secondary | ICD-10-CM | POA: Diagnosis not present

## 2024-01-11 DIAGNOSIS — Z0000 Encounter for general adult medical examination without abnormal findings: Secondary | ICD-10-CM | POA: Diagnosis not present

## 2024-01-11 DIAGNOSIS — J449 Chronic obstructive pulmonary disease, unspecified: Secondary | ICD-10-CM | POA: Diagnosis not present

## 2024-01-11 DIAGNOSIS — J45909 Unspecified asthma, uncomplicated: Secondary | ICD-10-CM | POA: Diagnosis not present

## 2024-01-11 DIAGNOSIS — R030 Elevated blood-pressure reading, without diagnosis of hypertension: Secondary | ICD-10-CM | POA: Diagnosis not present

## 2024-01-11 DIAGNOSIS — Z780 Asymptomatic menopausal state: Secondary | ICD-10-CM | POA: Diagnosis not present

## 2024-01-11 DIAGNOSIS — J4550 Severe persistent asthma, uncomplicated: Secondary | ICD-10-CM | POA: Diagnosis not present

## 2024-01-13 ENCOUNTER — Other Ambulatory Visit: Payer: Self-pay | Admitting: Family Medicine

## 2024-01-13 DIAGNOSIS — R112 Nausea with vomiting, unspecified: Secondary | ICD-10-CM

## 2024-01-13 DIAGNOSIS — J4550 Severe persistent asthma, uncomplicated: Secondary | ICD-10-CM

## 2024-01-14 NOTE — Telephone Encounter (Signed)
 Spoke with patient regarding prior message . Patient stated she has picked up Zolpidem  5mg . Patient's voice was understanding.Nothing else further needed.

## 2024-01-21 ENCOUNTER — Ambulatory Visit: Attending: Family Medicine | Admitting: Pharmacist

## 2024-01-21 ENCOUNTER — Encounter: Payer: Self-pay | Admitting: Pharmacist

## 2024-01-21 DIAGNOSIS — Z7985 Long-term (current) use of injectable non-insulin antidiabetic drugs: Secondary | ICD-10-CM

## 2024-01-21 DIAGNOSIS — E119 Type 2 diabetes mellitus without complications: Secondary | ICD-10-CM

## 2024-01-21 DIAGNOSIS — Z794 Long term (current) use of insulin: Secondary | ICD-10-CM

## 2024-01-21 MED ORDER — TIRZEPATIDE 10 MG/0.5ML ~~LOC~~ SOAJ: 10.0000 mg | SUBCUTANEOUS | 1 refills | Status: DC

## 2024-01-21 NOTE — Progress Notes (Signed)
 I connected with  [name] on 01/21/24 by a video enabled telemedicine application and verified that I am speaking with the correct person using two identifiers.   I discussed the limitations of evaluation and management by telemedicine. The patient expressed understanding and agreed to proceed.  Location of myself: office   Location of patient: at home  Persons participating in the call: myself and the patient.  Pharmacy TNM Diabetes Measure Review  S: PCP: Dr. Newlin  Patient was identified in a report as being at risk for failing the True [place] Metric of A1c control (<8%) in [place] and [place] American patients. Last PCP visit was via video on 09/24/2023 with Dr. Newlin.  I last saw her on 12/24/2023 and her A1c at that visit was 11.5. This did not correlate with her CGM data. We concluded that she was most likely hyperglycemic in the months leading up to April. Since she has been on the Mounjaro , her CGM data has shown steady improvement. When I saw her last month, we increased Mounjaro  dose to 7.5mg  weekly. Since that visit, she reports doing well. Denies any NV, abdominal pain since Mounjaro  increase. Continues to be adherent to this in addition to her Humalog  and Tresiba . She has no complaints today.  Current diabetes medications include: Humalog  20u TID before meals, Tresiba  170u daily, Mounjaro  7.5 mg weekly Patient reports adherence to taking all medications as prescribed.  Insurance coverage: [employer]  Patient denies hypoglycemic events.  Patient reported dietary habits: none reported   Patient-reported exercise habits: none reported   Patient denies polyuria. Patient reports blurred vision but no changes since seeing Dr. Adan Holms in Jan.  Patient reports stable neuropathy. Patient reports regular foot checks.   O:  Date of Download: 01/21/2024, 7-day report % Time CGM is active: 100% Average Glucose: 213 mg/dL Glucose Management Indicator: NA   Glucose Variability: NA (goal <36%) Time in Goal:  - Time in range 70-180: 30% - Time above range: 70% - Time below range: 0%  Lab Results  Component Value Date   HGBA1C 11.5 (A) 12/24/2023   There were no vitals filed for this visit.  Lipid Panel     Component Value Date/Time   CHOL 144 10/09/2022 1046   TRIG 124 10/09/2022 1046   HDL 54 10/09/2022 1046   CHOLHDL 3.2 06/15/2018 1031   CHOLHDL 3.9 02/22/2016 0954   VLDL 26 02/22/2016 0954   LDLCALC 68 10/09/2022 1046    Clinical Atherosclerotic Cardiovascular Disease (ASCVD): No  The 10-year ASCVD risk score (Arnett DK, et al., 2019) is: 14.6%   Values used to calculate the score:     Age: 53 years     Sex: Female     Is Non-Hispanic African American: Yes     Diabetic: Yes     Tobacco smoker: No     Systolic Blood Pressure: 152 mmHg     Is BP treated: Yes     HDL Cholesterol: 54 mg/dL     Total Cholesterol: 144 mg/dL   Patient is participating in a Managed Medicaid Plan: No   A/P: Diabetes longstanding currently uncontrolled based on A1c last month. Her A1c is higher compared to her CGM report. I covered this with her and emphasized her improvement in glycemic control since being on Mounjaro . Dr. Newlin added back Mounjaro  in January and the pt is tolerating this well with dose increases. Denies any NV, abdominal pain since increasing to the 7.5mg  dose and she's been taking this  for ~1 month. We will go ahead and continue to increase gradually given her hx of gastroparesis.  -Increased dose of Mounjaro  to 10 mg weekly.  -Continue current doses of insulin . As she increases the Mounjaro  dose, we may need to decrease insulin . I educated her on this and instructed her to dial  Tresiba  back to 160 units daily should hypoglycemia occur. -Patient educated on purpose, proper use, and potential adverse effects of Mounjaro .  -Extensively discussed pathophysiology of diabetes, recommended lifestyle interventions, dietary effects on  blood sugar control.  -Counseled on s/sx of and management of hypoglycemia.  -Next A1c anticipated 03/2024.   Follow-up:  Pharmacist in 1 month via telephone.  [name], PharmD, [name], CPP Clinical Pharmacist [facility] [phone]

## 2024-01-24 ENCOUNTER — Other Ambulatory Visit: Payer: Self-pay | Admitting: Family Medicine

## 2024-01-24 DIAGNOSIS — G43119 Migraine with aura, intractable, without status migrainosus: Secondary | ICD-10-CM

## 2024-01-24 DIAGNOSIS — J4550 Severe persistent asthma, uncomplicated: Secondary | ICD-10-CM

## 2024-01-24 DIAGNOSIS — F330 Major depressive disorder, recurrent, mild: Secondary | ICD-10-CM

## 2024-01-25 ENCOUNTER — Encounter: Payer: Self-pay | Admitting: Pulmonary Disease

## 2024-01-25 ENCOUNTER — Ambulatory Visit: Admitting: Pulmonary Disease

## 2024-01-25 VITALS — BP 142/90 | HR 108 | Temp 98.7°F | Ht 61.0 in | Wt 253.0 lb

## 2024-01-25 DIAGNOSIS — J398 Other specified diseases of upper respiratory tract: Secondary | ICD-10-CM

## 2024-01-25 DIAGNOSIS — J4550 Severe persistent asthma, uncomplicated: Secondary | ICD-10-CM | POA: Diagnosis not present

## 2024-01-25 DIAGNOSIS — R0602 Shortness of breath: Secondary | ICD-10-CM

## 2024-01-25 DIAGNOSIS — F5104 Psychophysiologic insomnia: Secondary | ICD-10-CM

## 2024-01-25 DIAGNOSIS — J449 Chronic obstructive pulmonary disease, unspecified: Secondary | ICD-10-CM

## 2024-01-25 DIAGNOSIS — Z930 Tracheostomy status: Secondary | ICD-10-CM

## 2024-01-25 MED ORDER — BUDESONIDE 0.5 MG/2ML IN SUSP: 0.5000 mg | Freq: Two times a day (BID) | RESPIRATORY_TRACT | 3 refills | Status: AC

## 2024-01-25 MED ORDER — ZOLPIDEM TARTRATE ER 6.25 MG PO TBCR: 6.2500 mg | EXTENDED_RELEASE_TABLET | Freq: Every evening | ORAL | 5 refills | Status: DC | PRN

## 2024-01-25 NOTE — Patient Instructions (Signed)
 Will see you in 6 months  Prescription for your Ambien  6.25 sent to pharmacy with 5 refills  Prescription for Pulmicort  to be used with wheezing, chest tightness  Call us  with significant concerns

## 2024-01-25 NOTE — Progress Notes (Signed)
 [name]    [id number]    05/28/1971  Primary Care Physician:Newlin, Lavelle Posey, MD  Referring Physician: Joaquin Mulberry, MD [address] [place],  [place] [address]  Chief complaint:   Follow-up for chronic respiratory failure  HPI:  Patient with chronic respiratory failure, chronic tracheostomy Recently had tracheostomy replacement with a larger size tube  Breathing feels better Overall more comfortable  Still gets short of breath with activity Occasional cough  No significant wheezing  Continues to follow-up with ENT on a regular basis No plans for decannulation  Did have an overnight oximetry with oxygen  at 3 L which did not show significant desaturations  Most significant complaint today is basically just not sleeping well at night, Ambien  was helping at 6.25, Ambien  5 mg is not helping as much   Outpatient Encounter Medications as of 01/25/2024  Medication Sig   albuterol  (PROVENTIL ) (2.5 MG/3ML) 0.083% nebulizer solution Take 3 mLs (2.5 mg total) by nebulization every 6 (six) hours as needed for wheezing or shortness of breath.   albuterol  (VENTOLIN  HFA) 108 (90 Base) MCG/ACT inhaler Inhale 2 puffs into the lungs every 6 (six) hours as needed for wheezing or shortness of breath.   amitriptyline  (ELAVIL ) 10 MG tablet TAKE 1 TABLET BY MOUTH AT BEDTIME(MUST KEEP UPCOMING OFFICE VISIT FOR REFILLS)   amLODipine  (NORVASC ) 10 MG tablet Take 10 mg by mouth daily.   Blood Glucose Monitoring Suppl (ONETOUCH VERIO FLEX SYSTEM) w/Device KIT USE TO CHECK GLUCOSE LEVELS DAILY   buPROPion  (WELLBUTRIN  SR) 150 MG 12 hr tablet Take 1 tablet (150 mg total) by mouth 2 (two) times daily.   cetirizine  (ZYRTEC ) 10 MG tablet Take 1 tablet (10 mg total) by mouth at bedtime.   Continuous Glucose Receiver (FREESTYLE LIBRE 2 READER) DEVI USE TO CHECK BLOOD SUGAR THREE TIMES DAILY.   Continuous Glucose Sensor (FREESTYLE LIBRE 2 SENSOR) MISC USE TO CHECK  BLOOD GLUCOSE THREE TIMES DAILY.  CHANGE SENSOR ONCE EVERY 14 DAYS.   fluconazole  (DIFLUCAN ) 150 MG tablet TAKE 1 TABLET BY MOUTH AS A ONE TIME DOSE   fluticasone  (FLONASE ) 50 MCG/ACT nasal spray Place 2 sprays into both nostrils daily.   furosemide  (LASIX ) 40 MG tablet Take 1 tablet (40 mg total) by mouth daily as needed for fluid or edema.   gabapentin  (NEURONTIN ) 600 MG tablet TAKE 1 TABLET BY MOUTH THREE TIMES DAILY   glucose blood (ONETOUCH VERIO) test strip Use to check glucose levels three times daily; E11.9   guaiFENesin  (ROBITUSSIN) 100 MG/5ML liquid Take 5 mLs by mouth every 6 (six) hours as needed for cough or to loosen phlegm.   hydrOXYzine  (ATARAX ) 25 MG tablet Take 1 tablet (25 mg total) by mouth 3 (three) times daily as needed for anxiety.   insulin  degludec (TRESIBA  FLEXTOUCH) 200 UNIT/ML FlexTouch Pen Inject 186 Units into the skin daily.   insulin  lispro (HUMALOG  KWIKPEN) 100 UNIT/ML KwikPen Inject 10 Units into the skin with breakfast, with lunch, and with evening meal. Continue to hold if not eating majority of meals   Insulin  Pen Needle 31G X 8 MM MISC Use to inject Tresiba  and Humalog . Pt may use 4 pen needles daily.   lisinopril  (ZESTRIL ) 20 MG tablet Take 1 tablet (20 mg total) by mouth daily.   Misc. Devices TEFL teacher. Devices MISC Nebulizer device. Diagnosis - Asthma   mometasone -formoterol  (DULERA ) 200-5 MCG/ACT AERO Inhale 2 puffs into the lungs  in the morning and at bedtime.   montelukast  (SINGULAIR ) 10 MG tablet TAKE 1 TABLET BY MOUTH AT BEDTIME   ondansetron  (ZOFRAN ) 4 MG tablet TAKE 1 TABLET BY MOUTH EVERY 8 HOURS AS NEEDED FOR NAUSEA FOR VOMITING   OneTouch Delica Lancets 33G MISC Use to check glucose levels daily; E11.9   oxyCODONE -acetaminophen  (PERCOCET) 10-325 MG tablet Take 1 tablet by mouth 4 (four) times daily as needed for pain.   pantoprazole  (PROTONIX ) 40 MG tablet Take 1 tablet by mouth twice daily   promethazine  (PHENERGAN ) 25 MG  suppository Place 1 suppository (25 mg total) rectally every 8 (eight) hours as needed for nausea or vomiting.   rosuvastatin  (CRESTOR ) 20 MG tablet Take 1 tablet by mouth once daily   sertraline  (ZOLOFT ) 100 MG tablet TAKE 2 TABLETS BY MOUTH AT BEDTIME   spironolactone  (ALDACTONE ) 25 MG tablet Take 25 mg by mouth daily.   tirzepatide  (MOUNJARO ) 10 MG/0.5ML Pen Inject 10 mg into the skin once a week.   tiZANidine  (ZANAFLEX ) 4 MG tablet Take 1 tablet (4 mg total) by mouth every 8 (eight) hours as needed for muscle spasms.   zolpidem  (AMBIEN ) 5 MG tablet Take 1 tablet (5 mg total) by mouth at bedtime as needed for sleep.   [DISCONTINUED] fluticasone -salmeterol (ADVAIR HFA) 230-21 MCG/ACT inhaler Inhale 2 puffs into the lungs 2 (two) times daily.   No facility-administered encounter medications on file as of 01/25/2024.    Allergies as of 01/25/2024 - Review Complete 01/25/2024  Allergen Reaction Noted   Reglan  [metoclopramide ] Other (See Comments) 06/04/2014    Past Medical History:  Diagnosis Date   Abnormal UGI series    Acute pain of right shoulder due to trauma 10/05/2018   Acute respiratory failure with hypoxia and hypercapnia (HCC) 10/23/2018   Allergy    Anemia    Anxiety    Arthritis    Asthma    Chest pain of uncertain etiology 03/22/2019   Chronic back pain    Chronic chest pain    resolved, no problems since 2019 per patient 07/27/19   Coffee ground vomiting 10/14/2021   COPD (chronic obstructive pulmonary disease) (HCC)    inhaler   Depression    DM (diabetes mellitus) (HCC)    INSULIN  DEPENDENT - TYPE 1   Dyspnea    GERD (gastroesophageal reflux disease)    Headache(784.0)    History of hiatal hernia    Hyperlipidemia    no med, diet controlled   Hypertension    Hypokalemia    Klebsiella cystitis 03/14/2017   Last Assessment & Plan:   Formatting of this note might be different from the original.  - On Zosyn  - Urine culture: Klebsiella pneumoniae   Neuromuscular  disorder (HCC)    Osteoarthritis    Oxygen  deficiency    Pneumonia    Respiratory disease 05/2017   uses inhalers, neb tx prn, no oxygen    Rotator cuff tear 07/28/2019   Sleep apnea    does not use CPAP due to trach, uses 2L supplement oxygen  at night   Status post tracheostomy (HCC) 03/14/2017   Last Assessment & Plan:  Formatting of this note might be different from the original. - VERY CRITICAL AIRWAY WITH EXTREME PRECUATION - On tach - ENT following along (please see above for current trach management plan)   Tracheostomy in place [facility]) 02/2017    Past Surgical History:  Procedure Laterality Date   ABDOMINAL HYSTERECTOMY  2009   APPENDECTOMY  BIOPSY  10/15/2021   Procedure: BIOPSY;  Surgeon: Lindle Rhea, MD;  Location: [facility] ENDOSCOPY;  Service: Gastroenterology;;   CESAREAN SECTION     x 3   CHOLECYSTECTOMY N/A 03/02/2014   Procedure: LAPAROSCOPIC CHOLECYSTECTOMY;  Surgeon: Brandy Cal. Cornett, MD;  Location: [facility] OR;  Service: General;  Laterality: N/A;   ESOPHAGOGASTRODUODENOSCOPY (EGD) WITH PROPOFOL  N/A 10/15/2021   Procedure: ESOPHAGOGASTRODUODENOSCOPY (EGD) WITH PROPOFOL ;  Surgeon: Lindle Rhea, MD;  Location: [facility] ENDOSCOPY;  Service: Gastroenterology;  Laterality: N/A;   HERNIA REPAIR     PANENDOSCOPY N/A 03/04/2017   Procedure: PANENDOSCOPY WITH POSSIBLE FOREIGN BODY REMOVAL;  Surgeon: Lenton Rail, MD;  [facility] OR;  Service: ENT;  Laterality: N/A;   ROTATOR CUFF REPAIR Left    ROTATOR CUFF REPAIR Right 07/27/2019   SHOULDER ARTHROSCOPY WITH SUBACROMIAL DECOMPRESSION, ROTATOR CUFF REPAIR AND BICEP TENDON REPAIR Right 07/28/2019   Procedure: RIGHT SHOULDER ARTHROSCOP, MINI OPEN ROTATOR CUFF TEAR REPAIR,  BICEPS TENODESIS, DISTAL CLAVICLE EXCISION;  Surgeon: Jasmine Mesi, MD;  Location: [facility] OR;  Service: Orthopedics;  Laterality: Right;   SPINE SURGERY     TOOTH EXTRACTION N/A 02/22/2021   Procedure: DENTAL RESTORATION/EXTRACTIONS;  Surgeon: Ascencion Lava, DMD;  Location: [facility] OR;  Service: Oral Surgery;  Laterality: N/A;   TRACHEOSTOMY  02/2017   TUBAL LIGATION     VESICOVAGINAL FISTULA CLOSURE W/ TAH  2009    Family History  Problem Relation Age of Onset   Heart attack Mother    Stroke Mother    Diabetes Mother    Hypertension Mother    Arthritis Mother    Stroke Father    Asthma Sister    Hypertension Sister    Asthma Sister    Diabetes Sister    Asthma Brother    Seizures Brother    Asthma Brother    Diabetes Brother    Asthma Daughter    Asthma Son    Asthma Grandson    Colon cancer Neg Hx    Rectal cancer Neg Hx    Stomach cancer Neg Hx    Esophageal cancer Neg Hx    Ovarian cancer Neg Hx    Pancreatic cancer Neg Hx     Social History   Socioeconomic History   Marital status: Married    Spouse name: Hydrologist   Number of children: Not on file   Years of education: Not on file   Highest education level: Not on file  Occupational History   Not on file  Tobacco Use   Smoking status: Former    Current packs/day: 0.00    Average packs/day: 0.5 packs/day for 25.0 years (12.5 ttl pk-yrs)    Types: Cigarettes    Start date: 01/21/1991    Quit date: 01/21/2016    Years since quitting: 8.0   Smokeless tobacco: Never  Vaping Use   Vaping status: Never Used  Substance and Sexual Activity   Alcohol use: Yes    Alcohol/week: 1.0 standard drink of alcohol    Types: 1 Glasses of wine per week    Comment: social wine   Drug use: Never    Types: Marijuana    Comment: couple times a month, Last use was on   Sexual activity: Yes    Partners: Male    Birth control/protection: Surgical    Comment: Hysterectomy  Other Topics Concern   Not on file  Social History Narrative   Lives with husband   Social Drivers of Corporate investment banker  Strain: Medium Risk (06/06/2021)   Overall Financial Resource Strain (CARDIA)    Difficulty of Paying Living Expenses: Somewhat hard  Food Insecurity: No Food Insecurity  (06/28/2023)   Hunger Vital Sign    Worried About Running Out of Food in the Last Year: Never true    Ran Out of Food in the Last Year: Never true  Transportation Needs: No Transportation Needs (07/03/2023)   PRAPARE - Administrator, Civil Service (Medical): No    Lack of Transportation (Non-Medical): No  Physical Activity: Inactive (02/18/2023)   Exercise Vital Sign    Days of Exercise per Week: 0 days    Minutes of Exercise per Session: 0 min  Stress: Stress Concern Present (02/18/2023)   [employer] of Occupational Health - Occupational Stress Questionnaire    Feeling of Stress : Rather much  Social Connections: Moderately Isolated (02/18/2023)   Social Connection and Isolation Panel [NHANES]    Frequency of Communication with Friends and Family: More than three times a week    Frequency of Social Gatherings with Friends and Family: More than three times a week    Attends Religious Services: Never    Database administrator or Organizations: No    Attends Banker Meetings: Never    Marital Status: Married  Catering manager Violence: Not At Risk (06/28/2023)   Humiliation, Afraid, Rape, and Kick questionnaire    Fear of Current or Ex-Partner: No    Emotionally Abused: No    Physically Abused: No    Sexually Abused: No    Review of Systems  Respiratory:  Positive for shortness of breath.   Psychiatric/Behavioral:  Positive for sleep disturbance.     Vitals:   01/25/24 1511  BP: (!) 142/90  Pulse: (!) 108  Temp: 98.7 F (37.1 C)  SpO2: 99%     Physical Exam Constitutional:      Appearance: She is obese.  HENT:     Head: Normocephalic.     Nose: Nose normal.     Mouth/Throat:     Mouth: Mucous membranes are moist.  Eyes:     General: No scleral icterus.    Pupils: Pupils are equal, round, and reactive to light.  Neck:     Comments: Tracheostomy tube in place Cardiovascular:     Rate and Rhythm: Normal rate and regular rhythm.      Heart sounds: No murmur heard.    No friction rub.  Pulmonary:     Effort: No respiratory distress.     Breath sounds: No stridor. No wheezing or rhonchi.  Musculoskeletal:     Cervical back: No rigidity or tenderness.  Neurological:     Mental Status: She is alert.  Psychiatric:        Mood and Affect: Mood normal.    Data Reviewed: Overnight oximetry reviewed showing no significant desaturations  Assessment:  Chronic respiratory failure Chronic tracheostomy status  Class III obesity  History of diastolic heart failure  Severe persistent asthma History of obstructive sleep apnea  Diabetes  Chronic insomnia, nonrestorative sleep   Plan/Recommendations: Continue tracheostomy -Tracheostomy dependent  Dulera   Will place prescription for Pulmicort  to be used via nebulizer - Does have albuterol  by nebulization as well  Graded activity as tolerated  Tentative follow-up in about 6 months  Encouraged to call with significant concerns  Regarding her insomnia, Ambien  5 mg is not working well for her, 6.25 did work better - Prescription placed    [employer]  Gaynell Keeler MD [facility] 01/25/2024, 3:19 PM  CC: Joaquin Mulberry, MD

## 2024-01-26 ENCOUNTER — Telehealth: Payer: Self-pay

## 2024-01-26 NOTE — Telephone Encounter (Signed)
 Request Reference Number: [id number]. ZOLPIDEM  ER TAB 6.25MG  is approved through 09/21/2024. Your patient may now fill this prescription and it will be covered. Effective Date: 01/26/2024 Authorization Expiration Date: 09/21/2024

## 2024-01-26 NOTE — Telephone Encounter (Signed)
*  Pulm  Pharmacy Patient Advocate Encounter   Received notification from CoverMyMeds that prior authorization for Zolpidem  Tartrate ER 6.25MG  er tablets  is required/requested.   Insurance verification completed.   The patient is insured through [facility] .   Per test claim: PA required; PA submitted to above mentioned insurance via CoverMyMeds Key/confirmation #/EOC [id number] Status is pending

## 2024-01-27 DIAGNOSIS — Z1231 Encounter for screening mammogram for malignant neoplasm of breast: Secondary | ICD-10-CM | POA: Diagnosis not present

## 2024-01-27 DIAGNOSIS — Z780 Asymptomatic menopausal state: Secondary | ICD-10-CM | POA: Diagnosis not present

## 2024-02-04 DIAGNOSIS — G4733 Obstructive sleep apnea (adult) (pediatric): Secondary | ICD-10-CM | POA: Diagnosis not present

## 2024-02-04 DIAGNOSIS — J45909 Unspecified asthma, uncomplicated: Secondary | ICD-10-CM | POA: Diagnosis not present

## 2024-02-04 DIAGNOSIS — J9610 Chronic respiratory failure, unspecified whether with hypoxia or hypercapnia: Secondary | ICD-10-CM | POA: Diagnosis not present

## 2024-02-04 DIAGNOSIS — J449 Chronic obstructive pulmonary disease, unspecified: Secondary | ICD-10-CM | POA: Diagnosis not present

## 2024-02-10 DIAGNOSIS — R5383 Other fatigue: Secondary | ICD-10-CM | POA: Diagnosis not present

## 2024-02-10 DIAGNOSIS — Z79899 Other long term (current) drug therapy: Secondary | ICD-10-CM | POA: Diagnosis not present

## 2024-02-10 DIAGNOSIS — E119 Type 2 diabetes mellitus without complications: Secondary | ICD-10-CM | POA: Diagnosis not present

## 2024-02-10 DIAGNOSIS — E7800 Pure hypercholesterolemia, unspecified: Secondary | ICD-10-CM | POA: Diagnosis not present

## 2024-02-10 DIAGNOSIS — I10 Essential (primary) hypertension: Secondary | ICD-10-CM | POA: Diagnosis not present

## 2024-02-10 DIAGNOSIS — E559 Vitamin D deficiency, unspecified: Secondary | ICD-10-CM | POA: Diagnosis not present

## 2024-02-10 DIAGNOSIS — M129 Arthropathy, unspecified: Secondary | ICD-10-CM | POA: Diagnosis not present

## 2024-02-10 DIAGNOSIS — M5416 Radiculopathy, lumbar region: Secondary | ICD-10-CM | POA: Diagnosis not present

## 2024-02-17 ENCOUNTER — Other Ambulatory Visit: Payer: Self-pay | Admitting: Family Medicine

## 2024-02-17 DIAGNOSIS — R112 Nausea with vomiting, unspecified: Secondary | ICD-10-CM

## 2024-02-17 DIAGNOSIS — G894 Chronic pain syndrome: Secondary | ICD-10-CM

## 2024-02-17 DIAGNOSIS — K219 Gastro-esophageal reflux disease without esophagitis: Secondary | ICD-10-CM

## 2024-02-22 DIAGNOSIS — J4540 Moderate persistent asthma, uncomplicated: Secondary | ICD-10-CM | POA: Diagnosis not present

## 2024-02-25 ENCOUNTER — Ambulatory Visit: Attending: Family Medicine | Admitting: Pharmacist

## 2024-02-25 ENCOUNTER — Other Ambulatory Visit: Payer: Self-pay | Admitting: Family Medicine

## 2024-02-25 DIAGNOSIS — Z930 Tracheostomy status: Secondary | ICD-10-CM | POA: Diagnosis not present

## 2024-02-25 DIAGNOSIS — J386 Stenosis of larynx: Secondary | ICD-10-CM | POA: Diagnosis not present

## 2024-02-25 DIAGNOSIS — G43119 Migraine with aura, intractable, without status migrainosus: Secondary | ICD-10-CM

## 2024-02-25 DIAGNOSIS — Z430 Encounter for attention to tracheostomy: Secondary | ICD-10-CM | POA: Diagnosis not present

## 2024-02-25 DIAGNOSIS — J3802 Paralysis of vocal cords and larynx, bilateral: Secondary | ICD-10-CM | POA: Diagnosis not present

## 2024-02-26 ENCOUNTER — Telehealth: Payer: Self-pay

## 2024-02-26 NOTE — Telephone Encounter (Signed)
 Copied from CRM [id number]. Topic: Appointments - Appointment Scheduling >> Feb 26, 2024 12:25 PM Sophia H wrote:  Patient is calling in to reschedule missed appointment with [facility].  Best contact number for patient is [phone]

## 2024-03-01 ENCOUNTER — Encounter: Payer: Self-pay | Admitting: Family Medicine

## 2024-03-01 ENCOUNTER — Other Ambulatory Visit: Payer: Self-pay | Admitting: Family Medicine

## 2024-03-01 DIAGNOSIS — G8929 Other chronic pain: Secondary | ICD-10-CM

## 2024-03-01 DIAGNOSIS — F330 Major depressive disorder, recurrent, mild: Secondary | ICD-10-CM

## 2024-03-02 DIAGNOSIS — J45909 Unspecified asthma, uncomplicated: Secondary | ICD-10-CM | POA: Diagnosis not present

## 2024-03-02 DIAGNOSIS — Z930 Tracheostomy status: Secondary | ICD-10-CM | POA: Diagnosis not present

## 2024-03-02 DIAGNOSIS — J449 Chronic obstructive pulmonary disease, unspecified: Secondary | ICD-10-CM | POA: Diagnosis not present

## 2024-03-02 DIAGNOSIS — G4733 Obstructive sleep apnea (adult) (pediatric): Secondary | ICD-10-CM | POA: Diagnosis not present

## 2024-03-06 ENCOUNTER — Encounter: Payer: Self-pay | Admitting: Family Medicine

## 2024-03-06 DIAGNOSIS — G4733 Obstructive sleep apnea (adult) (pediatric): Secondary | ICD-10-CM | POA: Diagnosis not present

## 2024-03-06 DIAGNOSIS — J45909 Unspecified asthma, uncomplicated: Secondary | ICD-10-CM | POA: Diagnosis not present

## 2024-03-06 DIAGNOSIS — J9610 Chronic respiratory failure, unspecified whether with hypoxia or hypercapnia: Secondary | ICD-10-CM | POA: Diagnosis not present

## 2024-03-06 DIAGNOSIS — J449 Chronic obstructive pulmonary disease, unspecified: Secondary | ICD-10-CM | POA: Diagnosis not present

## 2024-03-10 DIAGNOSIS — Z79899 Other long term (current) drug therapy: Secondary | ICD-10-CM | POA: Diagnosis not present

## 2024-03-10 DIAGNOSIS — M5416 Radiculopathy, lumbar region: Secondary | ICD-10-CM | POA: Diagnosis not present

## 2024-03-17 ENCOUNTER — Other Ambulatory Visit: Payer: Self-pay | Admitting: Family Medicine

## 2024-03-17 ENCOUNTER — Encounter: Payer: Self-pay | Admitting: Family Medicine

## 2024-03-17 DIAGNOSIS — G894 Chronic pain syndrome: Secondary | ICD-10-CM

## 2024-03-17 DIAGNOSIS — G43119 Migraine with aura, intractable, without status migrainosus: Secondary | ICD-10-CM

## 2024-03-17 MED ORDER — AMOXICILLIN 500 MG PO CAPS: 500.0000 mg | ORAL_CAPSULE | Freq: Three times a day (TID) | ORAL | 0 refills | Status: AC

## 2024-03-22 ENCOUNTER — Other Ambulatory Visit: Payer: Self-pay | Admitting: Family Medicine

## 2024-03-22 DIAGNOSIS — R112 Nausea with vomiting, unspecified: Secondary | ICD-10-CM

## 2024-03-22 DIAGNOSIS — G43119 Migraine with aura, intractable, without status migrainosus: Secondary | ICD-10-CM

## 2024-03-27 ENCOUNTER — Other Ambulatory Visit: Payer: Self-pay | Admitting: Family Medicine

## 2024-03-27 ENCOUNTER — Encounter: Payer: Self-pay | Admitting: Family Medicine

## 2024-03-27 DIAGNOSIS — J4540 Moderate persistent asthma, uncomplicated: Secondary | ICD-10-CM

## 2024-03-28 ENCOUNTER — Telehealth: Payer: Self-pay | Admitting: Family Medicine

## 2024-03-28 ENCOUNTER — Ambulatory Visit: Attending: Family Medicine | Admitting: Pharmacist

## 2024-03-28 ENCOUNTER — Other Ambulatory Visit: Payer: Self-pay

## 2024-03-28 ENCOUNTER — Encounter: Payer: Self-pay | Admitting: Pharmacist

## 2024-03-28 DIAGNOSIS — Z794 Long term (current) use of insulin: Secondary | ICD-10-CM

## 2024-03-28 DIAGNOSIS — Z7985 Long-term (current) use of injectable non-insulin antidiabetic drugs: Secondary | ICD-10-CM

## 2024-03-28 DIAGNOSIS — E119 Type 2 diabetes mellitus without complications: Secondary | ICD-10-CM

## 2024-03-28 DIAGNOSIS — E109 Type 1 diabetes mellitus without complications: Secondary | ICD-10-CM

## 2024-03-28 MED ORDER — TIRZEPATIDE 10 MG/0.5ML ~~LOC~~ SOAJ: 10.0000 mg | SUBCUTANEOUS | 1 refills | Status: DC

## 2024-03-28 MED ORDER — INSULIN LISPRO (1 UNIT DIAL) 100 UNIT/ML (KWIKPEN): 10.0000 [IU] | PEN_INJECTOR | Freq: Three times a day (TID) | SUBCUTANEOUS | 3 refills | Status: DC

## 2024-03-28 NOTE — Progress Notes (Signed)
 I connected with  [name] on 03/28/24 by a video enabled telemedicine application and verified that I am speaking with the correct person using two identifiers.   I discussed the limitations of evaluation and management by telemedicine. The patient expressed understanding and agreed to proceed.  Location of myself: office   Location of patient: at home  Persons participating in the call: myself and the patient.  Pharmacy TNM Diabetes Measure Review  S: PCP: [name]  Patient was identified in a report as being at risk for failing the True [place] Metric of A1c control (<8%) in [place] and [place] American patients. Last PCP visit was via video on 09/24/2023 with [name].  I last saw her on 01/21/2024. Unfortunately, we had follow-up planned in June but she was unavailable to take my call at the time.  Overall, she's done better since being on the Mounjaro . Her CGM data has shown steady improvement. She does not have her machine with her at the time of our call but reports numbers have been in the 200s. Gives reason that she has not had Mounjaro  since May. When I saw her in May, we increased Mounjaro  dose to 10 mg weekly. Since that visit, she has been unable to get it. She's unsure why her pharmacy has not dispensed this. Continues to be adherent to her Humalog  and Tresiba . She has no complaints today.  Current diabetes medications include: Humalog  20u TID before meals, Tresiba  170u daily, Mounjaro  10mg  weekly Patient reports adherence to taking all medications as prescribed.  Insurance coverage: [employer]  Patient denies hypoglycemic events.  Patient reported dietary habits: none reported   Patient-reported exercise habits: none reported   Patient denies polyuria. Patient reports blurred vision but no changes since seeing Dr. Delbert in Jan.  Patient reports stable neuropathy. Patient reports regular foot checks.   Patient reported home sugar readings:  names 34s - 80s last couple of days due to increased stress from two deaths in her family. Usually runs ~200 and 300s since running out of Mounjaro .  O:  Lab Results  Component Value Date   HGBA1C 11.5 (A) 12/24/2023   There were no vitals filed for this visit.  Lipid Panel     Component Value Date/Time   CHOL 144 10/09/2022 1046   TRIG 124 10/09/2022 1046   HDL 54 10/09/2022 1046   CHOLHDL 3.2 06/15/2018 1031   CHOLHDL 3.9 02/22/2016 0954   VLDL 26 02/22/2016 0954   LDLCALC 68 10/09/2022 1046   Clinical Atherosclerotic Cardiovascular Disease (ASCVD): No  The 10-year ASCVD risk score (Arnett DK, et al., 2019) is: 9%   Values used to calculate the score:     Age: 53 years     Clincally relevant sex: Female     Is Non-Hispanic African American: Yes     Diabetic: Yes     Tobacco smoker: No     Systolic Blood Pressure: 132 mmHg     Is BP treated: Yes     HDL Cholesterol: 54 mg/dL     Total Cholesterol: 144 mg/dL   Patient is participating in a Managed Medicaid Plan: No   A/P: Diabetes longstanding currently uncontrolled based on A1c in [place]. [name] added back Mounjaro  in January and the pt is tolerating this well with dose increases. Denies any NV, abdominal pain since taking, however, has been without for ~2 months due to issues with her pharmacy. Since she has tolerating this well up to this point, we  can try to resume at the 10 mg dose. I sent this rxn in to her pharmacy. She has an appointment in-person with [name] tomorrow. She will let us  know then if her pharmacy has not contacted her. -Resume Mounjaro  10 mg weekly.  -Continue current doses of insulin . As she increases the Mounjaro  dose, we may need to decrease insulin . I educated her on this and instructed her to dial  Tresiba  back to 160 units daily should hypoglycemia occur. -Patient educated on purpose, proper use, and potential adverse effects of Mounjaro .  -Extensively discussed pathophysiology of diabetes,  recommended lifestyle interventions, dietary effects on blood sugar control.  -Counseled on s/sx of and management of hypoglycemia.  -Next A1c anticipated 03/2024.   Follow-up:  Pharmacist in 1 month via telephone. PCP 03/29/2024 for A1c and chronic disease state management.  [name], PharmD, [name], CPP Clinical Pharmacist [facility] [phone]

## 2024-03-28 NOTE — Telephone Encounter (Signed)
 Called pt to confirm appt. Pt will be present.

## 2024-03-29 ENCOUNTER — Ambulatory Visit: Attending: Family Medicine | Admitting: Family Medicine

## 2024-03-29 ENCOUNTER — Encounter: Payer: Self-pay | Admitting: Family Medicine

## 2024-03-29 VITALS — BP 164/104 | HR 101 | Wt 252.8 lb

## 2024-03-29 DIAGNOSIS — E1069 Type 1 diabetes mellitus with other specified complication: Secondary | ICD-10-CM | POA: Diagnosis not present

## 2024-03-29 DIAGNOSIS — E1143 Type 2 diabetes mellitus with diabetic autonomic (poly)neuropathy: Secondary | ICD-10-CM

## 2024-03-29 DIAGNOSIS — Z6841 Body Mass Index (BMI) 40.0 and over, adult: Secondary | ICD-10-CM

## 2024-03-29 DIAGNOSIS — Z794 Long term (current) use of insulin: Secondary | ICD-10-CM

## 2024-03-29 DIAGNOSIS — F331 Major depressive disorder, recurrent, moderate: Secondary | ICD-10-CM

## 2024-03-29 DIAGNOSIS — G894 Chronic pain syndrome: Secondary | ICD-10-CM

## 2024-03-29 DIAGNOSIS — J4550 Severe persistent asthma, uncomplicated: Secondary | ICD-10-CM | POA: Diagnosis not present

## 2024-03-29 DIAGNOSIS — E1159 Type 2 diabetes mellitus with other circulatory complications: Secondary | ICD-10-CM

## 2024-03-29 DIAGNOSIS — Z23 Encounter for immunization: Secondary | ICD-10-CM

## 2024-03-29 DIAGNOSIS — I10 Essential (primary) hypertension: Secondary | ICD-10-CM

## 2024-03-29 DIAGNOSIS — F330 Major depressive disorder, recurrent, mild: Secondary | ICD-10-CM

## 2024-03-29 DIAGNOSIS — E1040 Type 1 diabetes mellitus with diabetic neuropathy, unspecified: Secondary | ICD-10-CM | POA: Diagnosis not present

## 2024-03-29 DIAGNOSIS — K3184 Gastroparesis: Secondary | ICD-10-CM | POA: Diagnosis not present

## 2024-03-29 DIAGNOSIS — G43119 Migraine with aura, intractable, without status migrainosus: Secondary | ICD-10-CM

## 2024-03-29 DIAGNOSIS — E119 Type 2 diabetes mellitus without complications: Secondary | ICD-10-CM

## 2024-03-29 LAB — POCT GLYCOSYLATED HEMOGLOBIN (HGB A1C): HbA1c, POC (controlled diabetic range): 9.2 % — AB (ref 0.0–7.0)

## 2024-03-29 LAB — GLUCOSE, POCT (MANUAL RESULT ENTRY): POC Glucose: 165 mg/dL — AB (ref 70–99)

## 2024-03-29 MED ORDER — PROMETHAZINE HCL 25 MG RE SUPP
25.0000 mg | Freq: Three times a day (TID) | RECTAL | 2 refills | Status: AC | PRN
Start: 2024-03-29 — End: ?

## 2024-03-29 MED ORDER — ROSUVASTATIN CALCIUM 20 MG PO TABS: 20.0000 mg | ORAL_TABLET | Freq: Every day | ORAL | 1 refills | Status: DC

## 2024-03-29 MED ORDER — SERTRALINE HCL 100 MG PO TABS: 200.0000 mg | ORAL_TABLET | Freq: Every day | ORAL | 0 refills | Status: DC

## 2024-03-29 MED ORDER — LISINOPRIL 20 MG PO TABS: 20.0000 mg | ORAL_TABLET | Freq: Every day | ORAL | 1 refills | Status: DC

## 2024-03-29 MED ORDER — GABAPENTIN 600 MG PO TABS
600.0000 mg | ORAL_TABLET | Freq: Three times a day (TID) | ORAL | 3 refills | Status: DC
Start: 2024-03-29 — End: 2024-04-08

## 2024-03-29 MED ORDER — TRESIBA FLEXTOUCH 200 UNIT/ML ~~LOC~~ SOPN: 185.0000 [IU] | PEN_INJECTOR | Freq: Every day | SUBCUTANEOUS | 3 refills | Status: DC

## 2024-03-29 NOTE — Progress Notes (Addendum)
 "  Subjective:  Patient ID: [name], female    DOB: 1970-11-02  Age: 53 y.o. MRN: [id number]  CC: Medical Management of Chronic Issues     Discussed the use of AI scribe software for clinical note transcription with the patient, who gave verbal consent to proceed.  History of Present Illness [name] is a 53 year old female with a history of type 1 diabetes mellitus (A1c 8.9), hypertension, obstructive sleep apnea, asthma, depression, s/p tracheostomy secondary to intubation related tracheal injury, subglottic stenosis (status post balloon dilatation and Kenalog  injection of stenosis tracheostomy tube exchange by ENT, status post right rotator cuff surgery in 07/2019. Uses 2L oxygen  at night, s/p R L4L5 transforaminal lumbar interbody fusion with rods, screws and cage, local bone graft, allogen bone graft, vivigen in 09/2021.  who presents for follow-up of her blood pressure and diabetes management.  Her blood pressure remains elevated despite adherence to amlodipine , Lasix , lisinopril , and spironolactone . A recent home measurement was 140/65 mmHg. She did not measure her blood pressure today due to poor sleep and rushing.  She is also yet to take her antihypertensive.  Sleep quality is poor, influenced by family stress, including a recent family death.  Diabetes management is challenging as she has been without Mounjaro  for one month. Her A1c is 9.2, down from 11.5, but higher than last fall's 8.9. She continues Tresiba  170 units daily and Humalog  3 times daily with meals.  Migraines are controlled with amitriptyline  and Imitrex . Asthma is managed by a pulmonologist with montelukast  and Dulera . Neuropathy, worse at night, is treated with gabapentin  and tizanidine  but remains poorly controlled.  She uses Ambien  for sleep, which is variably effective. She has not had labs since last October and is due for a pneumonia vaccine, last received in  2018.    Past Medical History:  Diagnosis Date   Abnormal UGI series    Acute pain of right shoulder due to trauma 10/05/2018   Acute respiratory failure with hypoxia and hypercapnia (HCC) 10/23/2018   Allergy    Anemia    Anxiety    Arthritis    Asthma    Chest pain of uncertain etiology 03/22/2019   Chronic back pain    Chronic chest pain    resolved, no problems since 2019 per patient 07/27/19   Coffee ground vomiting 10/14/2021   COPD (chronic obstructive pulmonary disease) (HCC)    inhaler   Depression    DM (diabetes mellitus) (HCC)    INSULIN  DEPENDENT - TYPE 1   Dyspnea    GERD (gastroesophageal reflux disease)    Headache(784.0)    History of hiatal hernia    Hyperlipidemia    no med, diet controlled   Hypertension    Hypokalemia    Klebsiella cystitis 03/14/2017   Last Assessment & Plan:   Formatting of this note might be different from the original.  - On Zosyn  - Urine culture: Klebsiella pneumoniae   Neuromuscular disorder (HCC)    Osteoarthritis    Oxygen  deficiency    Pneumonia    Respiratory disease 05/2017   uses inhalers, neb tx prn, no oxygen    Rotator cuff tear 07/28/2019   Sleep apnea    does not use CPAP due to trach, uses 2L supplement oxygen  at night   Status post tracheostomy (HCC) 03/14/2017   Last Assessment & Plan:  Formatting of this note might be different from the original. - VERY CRITICAL AIRWAY WITH EXTREME PRECUATION - On  tach - ENT following along (please see above for current trach management plan)   Tracheostomy in place [facility]) 02/2017    Past Surgical History:  Procedure Laterality Date   ABDOMINAL HYSTERECTOMY  2009   APPENDECTOMY     BIOPSY  10/15/2021   Procedure: BIOPSY;  Surgeon: Eda Iha, MD;  Location: [facility] ENDOSCOPY;  Service: Gastroenterology;;   CESAREAN SECTION     x 3   CHOLECYSTECTOMY N/A 03/02/2014   Procedure: LAPAROSCOPIC CHOLECYSTECTOMY;  Surgeon: Debby LABOR. Cornett, MD;  Location: [facility] OR;  Service:  General;  Laterality: N/A;   ESOPHAGOGASTRODUODENOSCOPY (EGD) WITH PROPOFOL  N/A 10/15/2021   Procedure: ESOPHAGOGASTRODUODENOSCOPY (EGD) WITH PROPOFOL ;  Surgeon: Eda Iha, MD;  Location: [facility] ENDOSCOPY;  Service: Gastroenterology;  Laterality: N/A;   HERNIA REPAIR     PANENDOSCOPY N/A 03/04/2017   Procedure: PANENDOSCOPY WITH POSSIBLE FOREIGN BODY REMOVAL;  Surgeon: Arlana Arnt, MD;  Location: [facility] OR;  Service: ENT;  Laterality: N/A;   ROTATOR CUFF REPAIR Left    ROTATOR CUFF REPAIR Right 07/27/2019   SHOULDER ARTHROSCOPY WITH SUBACROMIAL DECOMPRESSION, ROTATOR CUFF REPAIR AND BICEP TENDON REPAIR Right 07/28/2019   Procedure: RIGHT SHOULDER ARTHROSCOP, MINI OPEN ROTATOR CUFF TEAR REPAIR,  BICEPS TENODESIS, DISTAL CLAVICLE EXCISION;  Surgeon: Addie Cordella Hamilton, MD;  Location: [facility] OR;  Service: Orthopedics;  Laterality: Right;   SPINE SURGERY     TOOTH EXTRACTION N/A 02/22/2021   Procedure: DENTAL RESTORATION/EXTRACTIONS;  Surgeon: Sheryle Hamilton, DMD;  Location: [facility] OR;  Service: Oral Surgery;  Laterality: N/A;   TRACHEOSTOMY  02/2017   TUBAL LIGATION     VESICOVAGINAL FISTULA CLOSURE W/ TAH  2009    Family History  Problem Relation Age of Onset   Heart attack Mother    Stroke Mother    Diabetes Mother    Hypertension Mother    Arthritis Mother    Stroke Father    Asthma Sister    Hypertension Sister    Asthma Sister    Diabetes Sister    Asthma Brother    Seizures Brother    Asthma Brother    Diabetes Brother    Asthma Daughter    Asthma Son    Asthma Grandson    Colon cancer Neg Hx    Rectal cancer Neg Hx    Stomach cancer Neg Hx    Esophageal cancer Neg Hx    Ovarian cancer Neg Hx    Pancreatic cancer Neg Hx     Social History   Socioeconomic History   Marital status: Married    Spouse name: hydrologist   Number of children: Not on file   Years of education: Not on file   Highest education level: Not on file  Occupational History   Not on file  Tobacco Use    Smoking status: Former    Current packs/day: 0.00    Average packs/day: 0.5 packs/day for 25.0 years (12.5 ttl pk-yrs)    Types: Cigarettes    Start date: 01/21/1991    Quit date: 01/21/2016    Years since quitting: 8.1   Smokeless tobacco: Never  Vaping Use   Vaping status: Never Used  Substance and Sexual Activity   Alcohol use: Yes    Alcohol/week: 1.0 standard drink of alcohol    Types: 1 Glasses of wine per week    Comment: social wine   Drug use: Never    Types: Marijuana    Comment: couple times a month, Last use was on   Sexual activity: Yes  Partners: Male    Birth control/protection: Surgical    Comment: Hysterectomy  Other Topics Concern   Not on file  Social History Narrative   Lives with husband   Social Drivers of Health   Financial Resource Strain: Medium Risk (06/06/2021)   Overall Financial Resource Strain (CARDIA)    Difficulty of Paying Living Expenses: Somewhat hard  Food Insecurity: No Food Insecurity (06/28/2023)   Hunger Vital Sign    Worried About Running Out of Food in the Last Year: Never true    Ran Out of Food in the Last Year: Never true  Transportation Needs: No Transportation Needs (07/03/2023)   PRAPARE - Administrator, Civil Service (Medical): No    Lack of Transportation (Non-Medical): No  Physical Activity: Inactive (02/18/2023)   Exercise Vital Sign    Days of Exercise per Week: 0 days    Minutes of Exercise per Session: 0 min  Stress: Stress Concern Present (02/18/2023)   [employer] of Occupational Health - Occupational Stress Questionnaire    Feeling of Stress : Rather much  Social Connections: Moderately Isolated (02/18/2023)   Social Connection and Isolation Panel    Frequency of Communication with Friends and Family: More than three times a week    Frequency of Social Gatherings with Friends and Family: More than three times a week    Attends Religious Services: Never    Database Administrator or  Organizations: No    Attends Banker Meetings: Never    Marital Status: Married    Allergies  Allergen Reactions   Reglan  [Metoclopramide ] Other (See Comments)    Panic attack    Outpatient Medications Prior to Visit  Medication Sig Dispense Refill   albuterol  (PROVENTIL ) (2.5 MG/3ML) 0.083% nebulizer solution Take 3 mLs (2.5 mg total) by nebulization every 6 (six) hours as needed for wheezing or shortness of breath. 75 mL 12   albuterol  (VENTOLIN  HFA) 108 (90 Base) MCG/ACT inhaler Inhale 2 puffs into the lungs every 6 (six) hours as needed for wheezing or shortness of breath. 18 g 6   amitriptyline  (ELAVIL ) 10 MG tablet TAKE 1 TABLET BY MOUTH AT BEDTIME . APPOINTMENT REQUIRED FOR FUTURE REFILLS 30 tablet 0   amLODipine  (NORVASC ) 10 MG tablet Take 10 mg by mouth daily.     Blood Glucose Monitoring Suppl (ONETOUCH VERIO FLEX SYSTEM) w/Device KIT USE TO CHECK GLUCOSE LEVELS DAILY 1 kit 0   budesonide  (PULMICORT ) 0.5 MG/2ML nebulizer solution Take 2 mLs (0.5 mg total) by nebulization 2 (two) times daily. 120 mL 3   buPROPion  (WELLBUTRIN  SR) 150 MG 12 hr tablet Take 1 tablet by mouth twice daily 180 tablet 1   cetirizine  (ZYRTEC ) 10 MG tablet Take 1 tablet (10 mg total) by mouth at bedtime. 90 tablet 1   Continuous Glucose Receiver (FREESTYLE LIBRE 2 READER) DEVI USE TO CHECK BLOOD SUGAR THREE TIMES DAILY. 1 each 0   Continuous Glucose Sensor (FREESTYLE LIBRE 2 SENSOR) MISC USE TO CHECK BLOOD GLUCOSE THREE TIMES DAILY.  CHANGE SENSOR ONCE EVERY 14 DAYS. 2 each 6   fluconazole  (DIFLUCAN ) 150 MG tablet TAKE 1 TABLET BY MOUTH AS A ONE TIME DOSE 1 tablet 0   fluticasone  (FLONASE ) 50 MCG/ACT nasal spray Place 2 sprays into both nostrils daily. 16 g 1   furosemide  (LASIX ) 40 MG tablet Take 1 tablet (40 mg total) by mouth daily as needed for fluid or edema. 30 tablet 1   glucose blood (ONETOUCH VERIO)  test strip Use to check glucose levels three times daily; E11.9 100 each 6    guaiFENesin  (ROBITUSSIN) 100 MG/5ML liquid Take 5 mLs by mouth every 6 (six) hours as needed for cough or to loosen phlegm. 118 mL 0   hydrOXYzine  (ATARAX ) 25 MG tablet Take 1 tablet (25 mg total) by mouth 3 (three) times daily as needed for anxiety. 21 tablet 0   insulin  lispro (HUMALOG  KWIKPEN) 100 UNIT/ML KwikPen Inject 10 Units into the skin with breakfast, with lunch, and with evening meal. Continue to hold if not eating majority of meals 15 mL 3   Insulin  Pen Needle 31G X 8 MM MISC Use to inject Tresiba  and Humalog . Pt may use 4 pen needles daily. 100 each 11   Misc. Devices MISC Shower chair 1 each 0   Misc. Devices MISC Nebulizer device. Diagnosis - Asthma 1 each 0   mometasone -formoterol  (DULERA ) 200-5 MCG/ACT AERO Inhale 2 puffs into the lungs in the morning and at bedtime. 1 each 6   montelukast  (SINGULAIR ) 10 MG tablet TAKE 1 TABLET BY MOUTH AT BEDTIME 90 tablet 0   ondansetron  (ZOFRAN ) 4 MG tablet TAKE 1 TABLET BY MOUTH EVERY 8 HOURS AS NEEDED FOR NAUSEA FOR VOMITING 30 tablet 0   OneTouch Delica Lancets 33G MISC Use to check glucose levels daily; E11.9 100 each 3   oxyCODONE -acetaminophen  (PERCOCET) 10-325 MG tablet Take 1 tablet by mouth 4 (four) times daily as needed for pain.     pantoprazole  (PROTONIX ) 40 MG tablet Take 1 tablet by mouth twice daily 180 tablet 0   spironolactone  (ALDACTONE ) 25 MG tablet Take 25 mg by mouth daily.     tirzepatide  (MOUNJARO ) 10 MG/0.5ML Pen Inject 10 mg into the skin once a week. 2 mL 1   tiZANidine  (ZANAFLEX ) 4 MG tablet TAKE 1 TABLET BY MOUTH EVERY 8 HOURS AS NEEDED FOR MUSCLE SPASMS 90 tablet 0   zolpidem  (AMBIEN  CR) 6.25 MG CR tablet Take 1 tablet (6.25 mg total) by mouth at bedtime as needed for sleep. 30 tablet 5   zolpidem  (AMBIEN ) 5 MG tablet Take 1 tablet (5 mg total) by mouth at bedtime as needed for sleep. 30 tablet 2   gabapentin  (NEURONTIN ) 600 MG tablet TAKE 1 TABLET BY MOUTH THREE TIMES DAILY 90 tablet 0   insulin  degludec (TRESIBA   FLEXTOUCH) 200 UNIT/ML FlexTouch Pen Inject 186 Units into the skin daily. 30 mL 3   lisinopril  (ZESTRIL ) 20 MG tablet Take 1 tablet (20 mg total) by mouth daily. 90 tablet 1   promethazine  (PHENERGAN ) 25 MG suppository Place 1 suppository (25 mg total) rectally every 8 (eight) hours as needed for nausea or vomiting. 20 each 0   rosuvastatin  (CRESTOR ) 20 MG tablet Take 1 tablet by mouth once daily 90 tablet 0   sertraline  (ZOLOFT ) 100 MG tablet TAKE 2 TABLETS BY MOUTH AT BEDTIME 180 tablet 0   No facility-administered medications prior to visit.     ROS Review of Systems  Constitutional:  Negative for activity change and appetite change.  HENT:  Negative for sinus pressure and sore throat.   Respiratory:  Negative for chest tightness, shortness of breath and wheezing.   Cardiovascular:  Negative for chest pain and palpitations.  Gastrointestinal:  Negative for abdominal distention, abdominal pain and constipation.  Genitourinary: Negative.   Musculoskeletal: Negative.   Neurological:  Positive for numbness.  Psychiatric/Behavioral:  Negative for behavioral problems and dysphoric mood.     Objective:  BP (!) 164/104 (  BP Location: Right Arm, Cuff Size: Large)   Pulse (!) 101   Wt 252 lb 12.8 oz (114.7 kg)   LMP  (LMP Unknown)   SpO2 100%   BMI 47.77 kg/m      03/29/2024    3:51 PM 03/29/2024    3:35 PM 01/25/2024    3:11 PM  BP/Weight  Systolic BP 164 152 142  Diastolic BP 104 99 90  Wt. (Lbs)  252.8 253  BMI  47.77 kg/m2 47.8 kg/m2      Physical Exam Constitutional:      Appearance: She is well-developed.  Neck:     Comments: Tracheostomy tube in place Cardiovascular:     Rate and Rhythm: Tachycardia present.     Heart sounds: Normal heart sounds. No murmur heard. Pulmonary:     Effort: Pulmonary effort is normal.     Breath sounds: Normal breath sounds. No wheezing or rales.  Chest:     Chest wall: No tenderness.  Abdominal:     General: Bowel sounds are normal.  There is no distension.     Palpations: Abdomen is soft. There is no mass.     Tenderness: There is no abdominal tenderness.  Musculoskeletal:        General: Normal range of motion.     Right lower leg: No edema.     Left lower leg: No edema.  Neurological:     Mental Status: She is alert and oriented to person, place, and time.  Psychiatric:        Mood and Affect: Mood normal.        Latest Ref Rng & Units 07/21/2023    4:27 AM 07/20/2023    4:34 AM 07/18/2023   11:17 PM  CMP  Glucose 70 - 99 mg/dL 717  747  734   BUN 6 - 20 mg/dL 14  17  20    Creatinine 0.44 - 1.00 mg/dL 8.80  8.84  7.99   Sodium 135 - 145 mmol/L 138  136  142   Potassium 3.5 - 5.1 mmol/L 4.5  4.3  3.9   Chloride 98 - 111 mmol/L 105  102  104   CO2 22 - 32 mmol/L 23  24    Calcium  8.9 - 10.3 mg/dL 9.7  9.7      Lipid Panel     Component Value Date/Time   CHOL 144 10/09/2022 1046   TRIG 124 10/09/2022 1046   HDL 54 10/09/2022 1046   CHOLHDL 3.2 06/15/2018 1031   CHOLHDL 3.9 02/22/2016 0954   VLDL 26 02/22/2016 0954   LDLCALC 68 10/09/2022 1046    CBC    Component Value Date/Time   WBC 11.7 (H) 07/21/2023 0427   RBC 4.00 07/21/2023 0427   HGB 11.1 (L) 07/21/2023 0427   HGB 11.9 04/12/2018 1035   HCT 34.3 (L) 07/21/2023 0427   HCT 34.8 04/12/2018 1035   PLT 382 07/21/2023 0427   PLT 373 04/12/2018 1035   MCV 85.8 07/21/2023 0427   MCV 84 04/12/2018 1035   MCH 27.8 07/21/2023 0427   MCHC 32.4 07/21/2023 0427   RDW 14.2 07/21/2023 0427   RDW 14.6 04/12/2018 1035   LYMPHSABS 2.5 07/21/2023 0427   LYMPHSABS 4.0 (H) 04/12/2018 1035   MONOABS 0.8 07/21/2023 0427   EOSABS 0.0 07/21/2023 0427   EOSABS 0.0 04/12/2018 1035   BASOSABS 0.0 07/21/2023 0427   BASOSABS 0.0 04/12/2018 1035    Lab Results  Component Value Date  HGBA1C 9.2 (A) 03/29/2024   Lab Results  Component Value Date   HGBA1C 9.2 (A) 03/29/2024   HGBA1C 11.5 (A) 12/24/2023   HGBA1C 8.9 (H) 06/28/2023        1.-Type 1 Diabetes mellitus with other associated complications (HCC) (Primary) Uncontrolled with A1c of 9.7 She has been out of Mounjaro  which I have refilled If she is unable to tolerate current Mounjaro  dose we will need to start her from the lowest dose and titrate up Counseled on Diabetic diet, the healthy plate, 849 minutes of moderate intensity exercise/week Blood sugar logs with fasting goals of 80-120 mg/dl, random of less than 819 and in the event of sugars less than 60 mg/dl or greater than 599 mg/dl encouraged to notify the clinic. Advised on the need for annual eye exams, annual foot exams, Pneumonia vaccine. - POCT glycosylated hemoglobin (Hb A1C) - POCT glucose (manual entry) - rosuvastatin  (CRESTOR ) 20 MG tablet; Take 1 tablet (20 mg total) by mouth daily.  Dispense: 90 tablet; Refill: 1  2. Chronic pain syndrome Currently under the care of pain management - gabapentin  (NEURONTIN ) 600 MG tablet; Take 1 tablet (600 mg total) by mouth 3 (three) times daily.  Dispense: 90 tablet; Refill: 3  3. Type 1 diabetes mellitus with diabetic neuropathy (HCC) Neuropathy is uncontrolled Would like to switch from gabapentin  to Lyrica  but given she is followed by pain management and I would like her to discuss this with them first since Lyrica  is contolled - Microalbumin / creatinine urine ratio; Future - LP+Non-HDL Cholesterol; Future - CMP14+EGFR; Future - insulin  degludec (TRESIBA  FLEXTOUCH) 200 UNIT/ML FlexTouch Pen; Inject 186 Units into the skin daily.  Dispense: 30 mL; Refill: 3  4. Long term current use of insulin  (HCC) - insulin  degludec (TRESIBA  FLEXTOUCH) 200 UNIT/ML FlexTouch Pen; Inject 186 Units into the skin daily.  Dispense: 30 mL; Refill: 3  5. Hypertension associated with diabetes (HCC) Uncontrolled She is yet to take her antihypertensives today She will check blood pressures at home and if they are running above goal I will adjust her regimen - lisinopril  (ZESTRIL )  20 MG tablet; Take 1 tablet (20 mg total) by mouth daily.  Dispense: 90 tablet; Refill: 1  6. Gastroparesis due to DM (HCC) Symptoms have been controlled She is also on a GLP-1 RA which she states she is able to tolerate - promethazine  (PHENERGAN ) 25 MG suppository; Place 1 suppository (25 mg total) rectally every 8 (eight) hours as needed for nausea or vomiting.  Dispense: 30 suppository; Refill: 2  7. Mild episode of recurrent major depressive disorder (HCC) Controlled - sertraline  (ZOLOFT ) 100 MG tablet; Take 2 tablets (200 mg total) by mouth at bedtime.  Dispense: 180 tablet; Refill: 0  8. Severe persistent asthma without complication Stable with no recent flares Continue Dulera  and Singulair   9. Intractable migraine with aura without status migrainosus Controlled She is currently on amitriptyline  which can cause weight gain I would love her to be switched to one of the newer medications like CGRP I have referred to neurology for possibly weaning off amitriptyline  and initiating - Ambulatory referral to Neurology   10. Body mass index (BMI) 50.0-59.9, adult (HCC) Restart Mounjaro  Will work towards getting her off amitriptyline  which could also be contributing to weight gain Exercise will be beneficial but this is limited due to limited activity and chronic pain  11. Need for Streptococcus pneumoniae vaccination - Pneumococcal conjugate vaccine 20-valent (Prevnar 20)   Meds ordered this encounter  Medications  gabapentin  (NEURONTIN ) 600 MG tablet    Sig: Take 1 tablet (600 mg total) by mouth 3 (three) times daily.    Dispense:  90 tablet    Refill:  3   insulin  degludec (TRESIBA  FLEXTOUCH) 200 UNIT/ML FlexTouch Pen    Sig: Inject 186 Units into the skin daily.    Dispense:  30 mL    Refill:  3    Dose increase   lisinopril  (ZESTRIL ) 20 MG tablet    Sig: Take 1 tablet (20 mg total) by mouth daily.    Dispense:  90 tablet    Refill:  1   promethazine  (PHENERGAN ) 25 MG  suppository    Sig: Place 1 suppository (25 mg total) rectally every 8 (eight) hours as needed for nausea or vomiting.    Dispense:  30 suppository    Refill:  2   rosuvastatin  (CRESTOR ) 20 MG tablet    Sig: Take 1 tablet (20 mg total) by mouth daily.    Dispense:  90 tablet    Refill:  1   sertraline  (ZOLOFT ) 100 MG tablet    Sig: Take 2 tablets (200 mg total) by mouth at bedtime.    Dispense:  180 tablet    Refill:  0    Follow-up: Return in about 3 months (around 06/29/2024) for Chronic medical conditions.    Visit required 46 minutes of patient care including median intraservice time, reviewing previous notes and test results, coordination of care, counseling the patient in addition to management of chronic medical conditions.Time also spent ordering medications, investigations and documenting in the chart.  All questions were answered to the patient's satisfaction     Corrina Sabin, MD, FAAFP. [facility] and [facility] [place], [place] [phone]   03/30/2024, 1:04 PM "

## 2024-03-29 NOTE — Patient Instructions (Signed)
 VISIT SUMMARY:  Today, we reviewed your blood pressure and diabetes management. We also discussed your neuropathy, migraines, and general health maintenance. We made some adjustments to your medications and planned for follow-up tests and appointments.  YOUR PLAN:  -TYPE 1 DIABETES MELLITUS: Your blood sugar levels are higher than desired, with an A1c of 9.2%. We will restart Mounjaro  and continue your current insulin  regimen with Tresiba  and Humalog . We will also conduct fasting blood tests to check your kidney and liver function, cholesterol, and blood count. Please contact us  if your blood sugar levels remain high after restarting Mounjaro .  -HYPERTENSION: Your blood pressure is still elevated. We will continue your current medications: amlodipine , lisinopril , furosemide , and spironolactone . Please monitor your blood pressure at home regularly and report if it consistently exceeds 140/80.  -NEUROPATHY: Neuropathy is causing discomfort, especially at night. We discussed the possibility of switching from gabapentin  to Lyrica  with your pain management specialist to better manage your symptoms.  -GASTROPARESIS: Gastroparesis is a condition that affects the stomach muscles and prevents proper stomach emptying. You can continue using Phenergan  as needed for nausea.  -MIGRAINE: Your migraines are currently managed with amitriptyline  and Imitrex . We will refer you to a neurologist to evaluate and possibly switch to a different medication to avoid weight gain associated with amitriptyline .  -ASTHMA: Your asthma is managed by your pulmonologist with montelukast . No changes were made today.  -GENERAL HEALTH MAINTENANCE: You are due for a pneumonia vaccine, which we will administer. Please schedule an eye exam if you haven't had one recently.  INSTRUCTIONS:  Please schedule a follow-up appointment in three months. We will review your lab results once they are available and communicate the findings via  MyChart.

## 2024-03-30 ENCOUNTER — Encounter: Payer: Self-pay | Admitting: Family Medicine

## 2024-03-30 LAB — HM DIABETES EYE EXAM

## 2024-04-03 ENCOUNTER — Encounter: Payer: Self-pay | Admitting: Family Medicine

## 2024-04-05 ENCOUNTER — Ambulatory Visit: Attending: Family Medicine

## 2024-04-05 DIAGNOSIS — E1040 Type 1 diabetes mellitus with diabetic neuropathy, unspecified: Secondary | ICD-10-CM

## 2024-04-05 DIAGNOSIS — J9610 Chronic respiratory failure, unspecified whether with hypoxia or hypercapnia: Secondary | ICD-10-CM | POA: Diagnosis not present

## 2024-04-05 DIAGNOSIS — J449 Chronic obstructive pulmonary disease, unspecified: Secondary | ICD-10-CM | POA: Diagnosis not present

## 2024-04-05 DIAGNOSIS — G4733 Obstructive sleep apnea (adult) (pediatric): Secondary | ICD-10-CM | POA: Diagnosis not present

## 2024-04-05 DIAGNOSIS — J45909 Unspecified asthma, uncomplicated: Secondary | ICD-10-CM | POA: Diagnosis not present

## 2024-04-06 ENCOUNTER — Ambulatory Visit: Payer: Self-pay | Admitting: Family Medicine

## 2024-04-06 DIAGNOSIS — R748 Abnormal levels of other serum enzymes: Secondary | ICD-10-CM

## 2024-04-07 DIAGNOSIS — M5416 Radiculopathy, lumbar region: Secondary | ICD-10-CM | POA: Diagnosis not present

## 2024-04-07 DIAGNOSIS — J4550 Severe persistent asthma, uncomplicated: Secondary | ICD-10-CM | POA: Diagnosis not present

## 2024-04-07 DIAGNOSIS — E119 Type 2 diabetes mellitus without complications: Secondary | ICD-10-CM | POA: Diagnosis not present

## 2024-04-07 DIAGNOSIS — M67449 Ganglion, unspecified hand: Secondary | ICD-10-CM | POA: Diagnosis not present

## 2024-04-07 DIAGNOSIS — Z79899 Other long term (current) drug therapy: Secondary | ICD-10-CM | POA: Diagnosis not present

## 2024-04-07 DIAGNOSIS — Z930 Tracheostomy status: Secondary | ICD-10-CM | POA: Diagnosis not present

## 2024-04-07 LAB — CMP14+EGFR
ALT: 17 IU/L (ref 0–32)
AST: 14 IU/L (ref 0–40)
Albumin: 4.5 g/dL (ref 3.8–4.9)
Alkaline Phosphatase: 184 IU/L — ABNORMAL HIGH (ref 44–121)
BUN/Creatinine Ratio: 14 (ref 9–23)
BUN: 18 mg/dL (ref 6–24)
Bilirubin Total: 0.2 mg/dL (ref 0.0–1.2)
CO2: 21 mmol/L (ref 20–29)
Calcium: 9.9 mg/dL (ref 8.7–10.2)
Chloride: 100 mmol/L (ref 96–106)
Creatinine, Ser: 1.26 mg/dL — ABNORMAL HIGH (ref 0.57–1.00)
Globulin, Total: 3.9 g/dL (ref 1.5–4.5)
Glucose: 187 mg/dL — ABNORMAL HIGH (ref 70–99)
Potassium: 5 mmol/L (ref 3.5–5.2)
Sodium: 141 mmol/L (ref 134–144)
Total Protein: 8.4 g/dL (ref 6.0–8.5)
eGFR: 51 mL/min/1.73 — ABNORMAL LOW (ref >59)

## 2024-04-07 LAB — LP+NON-HDL CHOLESTEROL
Cholesterol, Total: 191 mg/dL (ref 100–199)
HDL: 56 mg/dL (ref >39)
LDL Chol Calc (NIH): 105 mg/dL — ABNORMAL HIGH (ref 0–99)
Total Non-HDL-Chol (LDL+VLDL): 135 mg/dL — ABNORMAL HIGH (ref 0–129)
Triglycerides: 176 mg/dL — ABNORMAL HIGH (ref 0–149)
VLDL Cholesterol Cal: 30 mg/dL (ref 5–40)

## 2024-04-07 LAB — MICROALBUMIN / CREATININE URINE RATIO
Creatinine, Urine: 241.8 mg/dL
Microalb/Creat Ratio: 10 mg/g{creat} (ref 0–29)
Microalbumin, Urine: 24.5 ug/mL

## 2024-04-08 ENCOUNTER — Other Ambulatory Visit: Payer: Self-pay | Admitting: Family Medicine

## 2024-04-08 ENCOUNTER — Other Ambulatory Visit

## 2024-04-08 MED ORDER — PREGABALIN 100 MG PO CAPS: 100.0000 mg | ORAL_CAPSULE | Freq: Two times a day (BID) | ORAL | 2 refills | Status: DC

## 2024-04-13 ENCOUNTER — Ambulatory Visit
Admission: RE | Admit: 2024-04-13 | Discharge: 2024-04-13 | Disposition: A | Discharge status: Home / Self Care | Source: Ambulatory Visit | Attending: Family Medicine | Admitting: Family Medicine

## 2024-04-13 DIAGNOSIS — K7689 Other specified diseases of liver: Secondary | ICD-10-CM | POA: Diagnosis not present

## 2024-04-13 DIAGNOSIS — K760 Fatty (change of) liver, not elsewhere classified: Secondary | ICD-10-CM | POA: Diagnosis not present

## 2024-04-13 DIAGNOSIS — R748 Abnormal levels of other serum enzymes: Secondary | ICD-10-CM

## 2024-04-13 DIAGNOSIS — Z9049 Acquired absence of other specified parts of digestive tract: Secondary | ICD-10-CM | POA: Diagnosis not present

## 2024-04-17 ENCOUNTER — Other Ambulatory Visit: Payer: Self-pay | Admitting: Family Medicine

## 2024-04-17 DIAGNOSIS — B3731 Acute candidiasis of vulva and vagina: Secondary | ICD-10-CM

## 2024-04-17 DIAGNOSIS — G894 Chronic pain syndrome: Secondary | ICD-10-CM

## 2024-04-18 ENCOUNTER — Ambulatory Visit: Payer: Self-pay | Admitting: Family Medicine

## 2024-04-18 ENCOUNTER — Telehealth: Payer: Self-pay | Admitting: Family Medicine

## 2024-04-18 NOTE — Telephone Encounter (Signed)
 Copied from CRM [id number]. Topic: General - Other >> Apr 18, 2024  2:33 PM Tiffany S wrote:  Reason for CRM: Please follow up with [employer] [phone] following up on paperwork faxed

## 2024-04-20 ENCOUNTER — Other Ambulatory Visit: Payer: Self-pay | Admitting: Family Medicine

## 2024-04-20 DIAGNOSIS — N1831 Chronic kidney disease, stage 3a: Secondary | ICD-10-CM | POA: Diagnosis not present

## 2024-04-20 DIAGNOSIS — I129 Hypertensive chronic kidney disease with stage 1 through stage 4 chronic kidney disease, or unspecified chronic kidney disease: Secondary | ICD-10-CM | POA: Diagnosis not present

## 2024-04-20 DIAGNOSIS — J9611 Chronic respiratory failure with hypoxia: Secondary | ICD-10-CM | POA: Diagnosis not present

## 2024-04-20 DIAGNOSIS — E559 Vitamin D deficiency, unspecified: Secondary | ICD-10-CM | POA: Diagnosis not present

## 2024-04-20 DIAGNOSIS — G894 Chronic pain syndrome: Secondary | ICD-10-CM

## 2024-04-20 DIAGNOSIS — E1122 Type 2 diabetes mellitus with diabetic chronic kidney disease: Secondary | ICD-10-CM | POA: Diagnosis not present

## 2024-04-20 NOTE — Telephone Encounter (Signed)
 [employer] was called and informed that fax was not received and they will re fax the paperwork.

## 2024-04-21 LAB — LAB REPORT - SCANNED
Creatinine, POC: 107.9 mg/dL
EGFR: 54

## 2024-04-22 ENCOUNTER — Ambulatory Visit: Payer: Self-pay

## 2024-04-22 ENCOUNTER — Ambulatory Visit: Attending: Family Medicine | Admitting: Pharmacist

## 2024-04-22 ENCOUNTER — Encounter: Payer: Self-pay | Admitting: Family Medicine

## 2024-04-22 ENCOUNTER — Other Ambulatory Visit: Payer: Self-pay

## 2024-04-22 ENCOUNTER — Emergency Department (HOSPITAL_COMMUNITY)
Admission: EM | Admit: 2024-04-22 | Discharge: 2024-04-22 | Discharge status: Against Medical Advice | Attending: Emergency Medicine | Admitting: Emergency Medicine

## 2024-04-22 ENCOUNTER — Encounter: Payer: Self-pay | Admitting: Pharmacist

## 2024-04-22 DIAGNOSIS — Z794 Long term (current) use of insulin: Secondary | ICD-10-CM

## 2024-04-22 DIAGNOSIS — Z5321 Procedure and treatment not carried out due to patient leaving prior to being seen by health care provider: Secondary | ICD-10-CM | POA: Diagnosis not present

## 2024-04-22 DIAGNOSIS — E1069 Type 1 diabetes mellitus with other specified complication: Secondary | ICD-10-CM

## 2024-04-22 DIAGNOSIS — E162 Hypoglycemia, unspecified: Secondary | ICD-10-CM | POA: Diagnosis not present

## 2024-04-22 DIAGNOSIS — J449 Chronic obstructive pulmonary disease, unspecified: Secondary | ICD-10-CM | POA: Diagnosis not present

## 2024-04-22 DIAGNOSIS — G4733 Obstructive sleep apnea (adult) (pediatric): Secondary | ICD-10-CM | POA: Diagnosis not present

## 2024-04-22 DIAGNOSIS — Z7985 Long-term (current) use of injectable non-insulin antidiabetic drugs: Secondary | ICD-10-CM

## 2024-04-22 LAB — CBG MONITORING, ED: Glucose-Capillary: 232 mg/dL — ABNORMAL HIGH (ref 70–99)

## 2024-04-22 MED ORDER — ACCU-CHEK SOFTCLIX LANCETS MISC: 6 refills | Status: AC

## 2024-04-22 MED ORDER — ACCU-CHEK GUIDE TEST VI STRP: ORAL_STRIP | 6 refills | Status: AC

## 2024-04-22 MED ORDER — ACCU-CHEK GUIDE W/DEVICE KIT
PACK | 0 refills | Status: DC
Start: 2024-04-22 — End: 2024-06-20

## 2024-04-22 NOTE — Telephone Encounter (Signed)
 FYI

## 2024-04-22 NOTE — ED Notes (Signed)
 Pt to nurses desk informs this RN she is going to leave, reports she has been in contact with her PCP and they recommend her changing out her meter. Pt encouraged to stay, off unit with visitor.

## 2024-04-22 NOTE — ED Triage Notes (Signed)
 Pt to ED c/o Hypoglycemia x 2 days, reports cbg 54 at home, reports has not taken insulin  x 2 days because of hypoglycemia.

## 2024-04-22 NOTE — Progress Notes (Signed)
 I connected with  Maniyah Mae [name] on 04/22/24 by a video enabled telemedicine application and verified that I am speaking with the correct person using two identifiers.   I discussed the limitations of evaluation and management by telemedicine. The patient expressed understanding and agreed to proceed.  Location of myself: office   Location of patient: [facility] ED   Persons participating in the call: myself, patient's daughter, and the patient.  Pharmacy TNM Diabetes Measure Review  S: PCP: Dr. Newlin  Patient was identified in a report as being at risk for failing the True [place] Metric of A1c control (<8%). Last PCP visit was in-person 03/29/24 with Dr. Newlin. A1c at that appt was 9.2, down from 11.5 prior. I last saw her on 03/28/24.   She contacted triage today with reported hypoglycemia. Dr. Newlin followed up with her and advised ED after pt endorsed hypoglycemia. Patient informed Dr. Newlin that her sugar was in the 50s today, treated with cupcakes, rose to 80 and then dropped again.  At the time of my call (2p, 04/22/24), patient is currently waiting in the waiting room of the ED. Triage found her CGB to be 232 mg/dL. Her sensor is still alerting that her sugar is low. Overall, she's been feeling poorly over the last 2 days. Tells me she has been experiencing hypoglycemia alarms on her CGM with readings in the 50s. She DOES NOT currently have a conventional finger-stick glucometer to verify her sensor readings.  She tells me over the past 2 days her sensor has been constantly alarming and her husband has been feeding her sugary foods to treat her blood sugar. She tells me her sensor never read over 80 mg/dL during this time. In addition, she has not taken any insulin  in 2 days for fear of hypoglycemia. Now, she is in the ED with hyperglycemia, fatigue, HA and a CBG of 232 mg/dL.  Current diabetes medications include: Humalog  20u TID before meals, Tresiba  170u daily, Mounjaro  10mg   weekly Patient reports she has not taken insulin  in 2 days due to hypoglycemia.   O:  Lab Results  Component Value Date   HGBA1C 9.2 (A) 03/29/2024   There were no vitals filed for this visit.  Lipid Panel     Component Value Date/Time   CHOL 191 04/05/2024 1654   TRIG 176 (H) 04/05/2024 1654   HDL 56 04/05/2024 1654   CHOLHDL 3.2 06/15/2018 1031   CHOLHDL 3.9 02/22/2016 0954   VLDL 26 02/22/2016 0954   LDLCALC 105 (H) 04/05/2024 1654   Clinical Atherosclerotic Cardiovascular Disease (ASCVD): No  The 10-year ASCVD risk score (Arnett DK, et al., 2019) is: 10.6%   Values used to calculate the score:     Age: 20 years     Clincally relevant sex: Female     Is Non-Hispanic African American: Yes     Diabetic: Yes     Tobacco smoker: No     Systolic Blood Pressure: 130 mmHg     Is BP treated: Yes     HDL Cholesterol: 56 mg/dL     Total Cholesterol: 191 mg/dL   Patient is participating in a Managed Medicaid Plan: No   A/P: Diabetes longstanding currently uncontrolled based on A1c last month. She was advised to go to the ED with what was thought to be hypoglycemia. CGM systems can read falsely low numbers when they become displaced. As I learned her CGM pattern over the past couple of days, I became suspicious that her  current in-use sensor is faulty. The fact that her blood sugar was never reading above 80 mg/dL over the last 2 days straight, despite aggressive hypoglycemia tx and skipping insulin  does not make sense. She agrees she thinks her sensor may have been displaced. Her sugar today in the ED is >200 mg/dL. Additionally, she HAS NOT verified her home CGM readings with fingerstick glucoses at home during this time. Back-up testing via fingerstick is advised per CGM manufacturers because of situations like these. I will send her rxns for Accu Chek supplies so she can have something available to verify her CGM readings with. I do not believe she has been hypoglycemic over these  past 2 days. We discussed ED return precautions. At this time, she is going to let the ED know she wishes to go home to pick-up and check her CBG via Accu Chek (fingerstick). I have set a reminder to follow-up with her via phone Wednesday.  -Continue Mounjaro  10 mg weekly.  -Continue current doses of insulin . As she increases the Mounjaro  dose, we may need to decrease insulin . I educated her on this and instructed her to contact us  should hypoglycemia recur. -Doctor, general practice Pharmacy and confirmed Accu Chek supplies are no cost to her and ready for pick-up. -Patient educated on purpose, proper use, and potential adverse effects of Mounjaro .  -Extensively discussed pathophysiology of diabetes, recommended lifestyle interventions, dietary effects on blood sugar control.  -Counseled on s/sx of and management of hypoglycemia.  -Next A1c anticipated 06/2024.   Follow-up:  Pharmacist in 1 week via telephone.  [name], PharmD, [name], CPP Clinical Pharmacist [facility] [phone]

## 2024-04-22 NOTE — Telephone Encounter (Signed)
 I spoke to the patient on the phone and verified that she has had hypoglycemia up to 50 and after eating cupcakes sugar goes up to 80 and then drops again.  She has not administered her Tresiba  or Humalog  for the last 2 days due to hypoglycemia but this has persisted regardless of what she eats.  Advised to go to the ED for further workup.

## 2024-04-22 NOTE — Telephone Encounter (Signed)
 FYI Only or Action Required?: FYI only for provider.  Patient was last seen in primary care on 03/29/2024 by Newlin, Enobong, MD.  Called Nurse Triage reporting Blood Sugar Problem.  Symptoms began several days ago.  Interventions attempted: Rest, hydration, or home remedies and Dietary changes.  Symptoms are: rapidly worsening.  Triage Disposition: Go to ED Now (Notify PCP)  Patient/caregiver understands and will follow disposition?: Yes   Copied from CRM [id number]. Topic: Clinical - Red Word Triage >> Apr 22, 2024 10:11 AM Larissa RAMAN wrote: [facility] that prompted transfer to Nurse Triage: blood glucose is 50 Reason for Disposition  [1] Low blood sugar symptoms persist > 30 minutes AND [2] using low blood sugar Care Advice  Answer Assessment - Initial Assessment Questions 1. SYMPTOMS: What symptoms are you concerned about?     Shakiness, Low Blood Glucose levels (Persistent)  2. ONSET:  When did the symptoms start?     Right before patient called.  3. BLOOD GLUCOSE: What is your blood glucose level?       65,   4. USUAL RANGE: What is your blood glucose level usually? (e.g., usual fasting morning value, usual evening value)     Normally   5. TYPE 1 or 2:  Do you know what type of diabetes you have?  (e.g., Type 1, Type 2, Gestational; doesn't know)      Type 2  6. INSULIN : Do you take insulin ? What type of insulin (s) do you use? What is the mode of delivery? (syringe, pen; injection or pump) When did you last give yourself an insulin  dose? (i.e., time or hours/minutes ago) How much did you give? (i.e., how many units)     Yes but has not taken insulin  since blood sugar levels began dropping like this. Has not utilized insulin  since 04-20-2024 when symptoms began.   7. DIABETES PILLS: Do you take any pills for your diabetes? If Yes, ask: What is the name of the medicine(s) that you take for high blood sugar?     No  8. OTHER SYMPTOMS: Do you have any  symptoms? (e.g., fever, frequent urination, difficulty breathing, vomiting)      Shakiness, Dizziness, Sweating  9. LOW BLOOD GLUCOSE TREATMENT: What have you done so far to treat the low blood glucose level?     Patient   10. FOOD: When did you last eat or drink?       15 minutes ago, currently nauseated.  11. ALONE: Are you alone right now or is someone with you?        No  12. PREGNANCY: Is there any chance you are pregnant? When was your last menstrual period?       No and No  Protocols used: Diabetes - Low Blood Sugar-A-AH

## 2024-04-25 ENCOUNTER — Telehealth: Payer: Self-pay

## 2024-04-25 ENCOUNTER — Encounter: Payer: Self-pay | Admitting: Family Medicine

## 2024-04-25 NOTE — Telephone Encounter (Signed)
 Can we re send her dental referral, patient states that she reached out to the office and they do not have the referral.

## 2024-04-26 ENCOUNTER — Encounter: Payer: Self-pay | Admitting: Family Medicine

## 2024-04-27 ENCOUNTER — Encounter: Payer: Self-pay | Admitting: Family Medicine

## 2024-04-27 ENCOUNTER — Telehealth: Payer: Self-pay

## 2024-04-27 ENCOUNTER — Telehealth: Payer: Self-pay | Admitting: Pharmacist

## 2024-04-27 NOTE — Telephone Encounter (Signed)
 I called the patient to follow up from her ED visit on 04/22/2024.  She left without being seen by a provider there.  While in the ED, she spoke to [name], [facility] about her hypoglycemic episodes and they discussed  that her CGM sensor may have been displaced.  She returned home to check her blood sugars with her standard glucometer.   She has been scheduled to see Herlene on 05/24/2024 and did not feel she needs to see Dr Delbert at this time. Her plan is to follow up with Herlene first.  I instructed her to call this clinic if she has any questions/concerns/ needs to speak to Luke/wants to see Dr Delbert and she said she understood.

## 2024-04-27 NOTE — Telephone Encounter (Signed)
 Noted. Patient has been sent a MyChart message with contact information.

## 2024-04-27 NOTE — Telephone Encounter (Signed)
 Hey friend - Can we try to schedule this patient for follow-up with me? I attempted to reach her today but had to leave a VM.

## 2024-04-28 NOTE — Telephone Encounter (Signed)
 Noted

## 2024-05-04 ENCOUNTER — Other Ambulatory Visit: Payer: Self-pay | Admitting: Family Medicine

## 2024-05-04 DIAGNOSIS — J4550 Severe persistent asthma, uncomplicated: Secondary | ICD-10-CM

## 2024-05-04 DIAGNOSIS — J4540 Moderate persistent asthma, uncomplicated: Secondary | ICD-10-CM

## 2024-05-05 DIAGNOSIS — Z79899 Other long term (current) drug therapy: Secondary | ICD-10-CM | POA: Diagnosis not present

## 2024-05-05 DIAGNOSIS — M5416 Radiculopathy, lumbar region: Secondary | ICD-10-CM | POA: Diagnosis not present

## 2024-05-06 DIAGNOSIS — J45909 Unspecified asthma, uncomplicated: Secondary | ICD-10-CM | POA: Diagnosis not present

## 2024-05-06 DIAGNOSIS — G4733 Obstructive sleep apnea (adult) (pediatric): Secondary | ICD-10-CM | POA: Diagnosis not present

## 2024-05-06 DIAGNOSIS — J449 Chronic obstructive pulmonary disease, unspecified: Secondary | ICD-10-CM | POA: Diagnosis not present

## 2024-05-09 ENCOUNTER — Other Ambulatory Visit: Payer: Self-pay | Admitting: Nurse Practitioner

## 2024-05-11 ENCOUNTER — Other Ambulatory Visit: Payer: Self-pay | Admitting: Family Medicine

## 2024-05-11 DIAGNOSIS — R112 Nausea with vomiting, unspecified: Secondary | ICD-10-CM

## 2024-05-11 DIAGNOSIS — G43119 Migraine with aura, intractable, without status migrainosus: Secondary | ICD-10-CM

## 2024-05-11 NOTE — Telephone Encounter (Signed)
 For review-Historical Provider/ok to fill??

## 2024-05-12 ENCOUNTER — Other Ambulatory Visit: Payer: Self-pay

## 2024-05-12 ENCOUNTER — Other Ambulatory Visit: Payer: Self-pay | Admitting: Pharmacist

## 2024-05-12 MED ORDER — TIRZEPATIDE 10 MG/0.5ML ~~LOC~~ SOAJ: 10.0000 mg | SUBCUTANEOUS | 1 refills | Status: DC

## 2024-05-12 MED ORDER — SPIRONOLACTONE 25 MG PO TABS: 25.0000 mg | ORAL_TABLET | Freq: Every day | ORAL | 0 refills | Status: DC

## 2024-05-12 NOTE — Telephone Encounter (Signed)
 Wyn Jackee VEAR Mickey., NP to Me (Selected Message)     05/11/24  5:48 PM Please advise patient to obtain refill from prescribing provider.   Addressed 05/12/24

## 2024-05-18 DIAGNOSIS — G4733 Obstructive sleep apnea (adult) (pediatric): Secondary | ICD-10-CM | POA: Diagnosis not present

## 2024-05-18 DIAGNOSIS — Z930 Tracheostomy status: Secondary | ICD-10-CM | POA: Diagnosis not present

## 2024-05-20 ENCOUNTER — Telehealth: Payer: Self-pay | Admitting: Family Medicine

## 2024-05-20 NOTE — Telephone Encounter (Signed)
 Called patient, no answer. Left voicemail confirming upcoming appointment on 05/24/2024 Provided callback number for any questions or changes.

## 2024-05-24 ENCOUNTER — Ambulatory Visit: Attending: Family Medicine | Admitting: Pharmacist

## 2024-05-24 ENCOUNTER — Encounter: Payer: Self-pay | Admitting: Pharmacist

## 2024-05-24 DIAGNOSIS — Z7985 Long-term (current) use of injectable non-insulin antidiabetic drugs: Secondary | ICD-10-CM

## 2024-05-24 DIAGNOSIS — E119 Type 2 diabetes mellitus without complications: Secondary | ICD-10-CM

## 2024-05-24 DIAGNOSIS — Z794 Long term (current) use of insulin: Secondary | ICD-10-CM

## 2024-05-24 NOTE — Progress Notes (Signed)
 I connected with  [name] on 05/24/24 by a video enabled telemedicine application and verified that I am speaking with the correct person using two identifiers.   I discussed the limitations of evaluation and management by telemedicine. The patient expressed understanding and agreed to proceed.  Location of myself: office   Location of patient: home  Persons participating in the call: myself, patient's daughter, and the patient.  Pharmacy TNM Diabetes Measure Review  S: PCP: Dr. Newlin  Patient was identified in a report as being at risk for failing the True [place] Metric of A1c control (<8%). Last PCP visit was in-person 03/29/24 with Dr. Newlin. A1c at that appt was 9.2, down from 11.5 prior. I last saw her on 04/22/2024 via telephone visit.   On 04/22/24, I connected with this patient after I saw that she was in the ED with hypoglycemia. We discussed that her findings were most consistent with a faulty sensor. I had her replace her sensor and continue her current regimen.   Today, she reports doing well. Denies any NV, abdominal pain. Taking Mounjaro  10 mg weekly in addition to Humalog  and Tresiba  as prescribed. She does not have her CGM with her at the time of the call but reports readings in the 100s-seldom 200s at home. Last night was elevated before bed at 219 mg/dL. Hypoglycemia has resolved since last phone visit. No alarms since her ED visit on 04/22/2024.  Current diabetes medications include: Humalog  10u TID before meals, Tresiba  170u daily, Mounjaro  10mg  weekly Patient reports adherence to her medication.  O:  Lab Results  Component Value Date   HGBA1C 9.2 (A) 03/29/2024   There were no vitals filed for this visit.  Lipid Panel     Component Value Date/Time   CHOL 191 04/05/2024 1654   TRIG 176 (H) 04/05/2024 1654   HDL 56 04/05/2024 1654   CHOLHDL 3.2 06/15/2018 1031   CHOLHDL 3.9 02/22/2016 0954   VLDL 26 02/22/2016 0954   LDLCALC 105 (H) 04/05/2024  1654   Clinical Atherosclerotic Cardiovascular Disease (ASCVD): No  The 10-year ASCVD risk score (Arnett DK, et al., 2019) is: 10.6%   Values used to calculate the score:     Age: 81 years     Clincally relevant sex: Female     Is Non-Hispanic African American: Yes     Diabetic: Yes     Tobacco smoker: No     Systolic Blood Pressure: 130 mmHg     Is BP treated: Yes     HDL Cholesterol: 56 mg/dL     Total Cholesterol: 191 mg/dL   Patient is participating in a Managed Medicaid Plan: No   A/P: Diabetes longstanding currently uncontrolled based on A1c in July. She endorses resolution of hypoglycemia since last phone visit. She is using her CGM but has Accu Chek for quality assurance. She does not currently endorse any hyperglycemia associated symptoms. We will continue her current medication regimen. Ideally, I would like to further titrate Mounjaro . However, she received a 71-month supply of the 10 mg dose just a couple of weeks ago. -Continue Mounjaro  10 mg weekly.  -Continue current doses of insulin . As she increases the Mounjaro  dose, we may need to decrease insulin . -Patient educated on purpose, proper use, and potential adverse effects of Mounjaro .  -Extensively discussed pathophysiology of diabetes, recommended lifestyle interventions, dietary effects on blood sugar control.  -Counseled on s/sx of and management of hypoglycemia.  -Next A1c anticipated 06/29/2024.   Follow-up:  Pharmacist: 07/14/24 via telephone  [name], PharmD, BCACP, CPP Clinical Pharmacist [facility] [phone]

## 2024-05-30 ENCOUNTER — Telehealth: Payer: Self-pay

## 2024-05-30 NOTE — Telephone Encounter (Signed)
 Patient was identified as falling into the [place] Measure - Diabetes.   Patient was: Appointment scheduled with primary care provider in the next 30 days.

## 2024-06-06 DIAGNOSIS — J449 Chronic obstructive pulmonary disease, unspecified: Secondary | ICD-10-CM | POA: Diagnosis not present

## 2024-06-06 DIAGNOSIS — Z79899 Other long term (current) drug therapy: Secondary | ICD-10-CM | POA: Diagnosis not present

## 2024-06-06 DIAGNOSIS — M5416 Radiculopathy, lumbar region: Secondary | ICD-10-CM | POA: Diagnosis not present

## 2024-06-06 DIAGNOSIS — E119 Type 2 diabetes mellitus without complications: Secondary | ICD-10-CM | POA: Diagnosis not present

## 2024-06-06 DIAGNOSIS — J45909 Unspecified asthma, uncomplicated: Secondary | ICD-10-CM | POA: Diagnosis not present

## 2024-06-06 DIAGNOSIS — J9610 Chronic respiratory failure, unspecified whether with hypoxia or hypercapnia: Secondary | ICD-10-CM | POA: Diagnosis not present

## 2024-06-06 DIAGNOSIS — I10 Essential (primary) hypertension: Secondary | ICD-10-CM | POA: Diagnosis not present

## 2024-06-06 DIAGNOSIS — G4733 Obstructive sleep apnea (adult) (pediatric): Secondary | ICD-10-CM | POA: Diagnosis not present

## 2024-06-14 ENCOUNTER — Other Ambulatory Visit: Payer: Self-pay | Admitting: Family Medicine

## 2024-06-14 DIAGNOSIS — R112 Nausea with vomiting, unspecified: Secondary | ICD-10-CM

## 2024-06-14 DIAGNOSIS — J4550 Severe persistent asthma, uncomplicated: Secondary | ICD-10-CM

## 2024-06-17 ENCOUNTER — Other Ambulatory Visit: Payer: Self-pay | Admitting: Family Medicine

## 2024-06-27 ENCOUNTER — Telehealth: Payer: Self-pay | Admitting: Family Medicine

## 2024-06-27 NOTE — Telephone Encounter (Signed)
 2nd attempt left vm sent out text message and Mychart message  to resch appt. Appt cancel pcp not in the office.

## 2024-06-27 NOTE — Telephone Encounter (Signed)
 1st attempt lvm to resch appt. Pcp not in the office

## 2024-06-29 ENCOUNTER — Ambulatory Visit: Admitting: Family Medicine

## 2024-06-29 DIAGNOSIS — Z930 Tracheostomy status: Secondary | ICD-10-CM | POA: Diagnosis not present

## 2024-06-29 DIAGNOSIS — G4733 Obstructive sleep apnea (adult) (pediatric): Secondary | ICD-10-CM | POA: Diagnosis not present

## 2024-07-03 DIAGNOSIS — Z79899 Other long term (current) drug therapy: Secondary | ICD-10-CM | POA: Diagnosis not present

## 2024-07-03 DIAGNOSIS — E119 Type 2 diabetes mellitus without complications: Secondary | ICD-10-CM | POA: Diagnosis not present

## 2024-07-03 DIAGNOSIS — M5416 Radiculopathy, lumbar region: Secondary | ICD-10-CM | POA: Diagnosis not present

## 2024-07-05 DIAGNOSIS — G4733 Obstructive sleep apnea (adult) (pediatric): Secondary | ICD-10-CM | POA: Diagnosis not present

## 2024-07-05 DIAGNOSIS — Z930 Tracheostomy status: Secondary | ICD-10-CM | POA: Diagnosis not present

## 2024-07-06 DIAGNOSIS — J449 Chronic obstructive pulmonary disease, unspecified: Secondary | ICD-10-CM | POA: Diagnosis not present

## 2024-07-06 DIAGNOSIS — Z79899 Other long term (current) drug therapy: Secondary | ICD-10-CM | POA: Diagnosis not present

## 2024-07-06 DIAGNOSIS — G4733 Obstructive sleep apnea (adult) (pediatric): Secondary | ICD-10-CM | POA: Diagnosis not present

## 2024-07-06 DIAGNOSIS — J45909 Unspecified asthma, uncomplicated: Secondary | ICD-10-CM | POA: Diagnosis not present

## 2024-07-06 DIAGNOSIS — J9610 Chronic respiratory failure, unspecified whether with hypoxia or hypercapnia: Secondary | ICD-10-CM | POA: Diagnosis not present

## 2024-07-14 ENCOUNTER — Ambulatory Visit: Attending: Family Medicine | Admitting: Pharmacist

## 2024-07-14 ENCOUNTER — Encounter: Payer: Self-pay | Admitting: Pharmacist

## 2024-07-14 DIAGNOSIS — Z7985 Long-term (current) use of injectable non-insulin antidiabetic drugs: Secondary | ICD-10-CM

## 2024-07-14 DIAGNOSIS — E119 Type 2 diabetes mellitus without complications: Secondary | ICD-10-CM | POA: Diagnosis not present

## 2024-07-14 DIAGNOSIS — Z794 Long term (current) use of insulin: Secondary | ICD-10-CM | POA: Diagnosis not present

## 2024-07-14 NOTE — Progress Notes (Signed)
 I connected with  [name] on 07/14/24 by a video enabled telemedicine application and verified that I am speaking with the correct person using two identifiers.   I discussed the limitations of evaluation and management by telemedicine. The patient expressed understanding and agreed to proceed.  Location of myself: office   Location of patient: home  Persons participating in the call: myself and the patient.  Pharmacy TNM Diabetes Measure Review  S: PCP: Dr. Newlin  Patient was identified in a report as being at risk for failing the True [place] Metric of A1c control (<8%). Last PCP visit was in-person 03/29/24 with Dr. Newlin. A1c at that appt was 9.2, down from 11.5 prior.   I last saw her on 05/24/2024 via telephone visit. We continued her medications at their current doses.   Today, she reports doing well. Denies any NV, abdominal pain. Taking Mounjaro  10 mg weekly in addition to Humalog  and Tresiba  as prescribed. She does not have her CGM with her at the time of the call but reports readings in the 100s-seldom 200s at home. No hypoglycemia alarms since her ED visit on 04/22/2024.  Current diabetes medications include: Humalog  10u TID before meals, Tresiba  170u daily, Mounjaro  10mg  weekly Patient reports adherence to her medication.  O:  Lab Results  Component Value Date   HGBA1C 9.2 (A) 03/29/2024   There were no vitals filed for this visit.  Lipid Panel     Component Value Date/Time   CHOL 191 04/05/2024 1654   TRIG 176 (H) 04/05/2024 1654   HDL 56 04/05/2024 1654   CHOLHDL 3.2 06/15/2018 1031   CHOLHDL 3.9 02/22/2016 0954   VLDL 26 02/22/2016 0954   LDLCALC 105 (H) 04/05/2024 1654   Clinical Atherosclerotic Cardiovascular Disease (ASCVD): No  The 10-year ASCVD risk score (Arnett DK, et al., 2019) is: 10.6%   Values used to calculate the score:     Age: 53 years     Clincally relevant sex: Female     Is Non-Hispanic African American: Yes      Diabetic: Yes     Tobacco smoker: No     Systolic Blood Pressure: 130 mmHg     Is BP treated: Yes     HDL Cholesterol: 56 mg/dL     Total Cholesterol: 191 mg/dL   Patient is participating in a Managed Medicaid Plan: No   A/P: Diabetes longstanding currently uncontrolled based on A1c in July. She is using her CGM but has Accu Chek for quality assurance. She does not currently endorse any hyperglycemia associated symptoms. We will continue her current medication regimen. Ideally, I would like to further titrate Mounjaro . However, she received a 35-month supply of the 10 mg dose in August and would prefer to finish those. She see Rosaline at [facility] in 2 weeks. I recommend to increase Mounjaro  at that time if her A1c is still above goal.  -Continue Mounjaro  10 mg weekly.  -Continue current doses of insulin . As she increases the Mounjaro  dose, we may need to decrease insulin . -Patient educated on purpose, proper use, and potential adverse effects of Mounjaro .  -Extensively discussed pathophysiology of diabetes, recommended lifestyle interventions, dietary effects on blood sugar control.  -Counseled on s/sx of and management of hypoglycemia.  -Next A1c anticipated 07/26/2024.   Follow-up:  Rosaline: 07/26/2024  [name], PharmD, BCACP, CPP Clinical Pharmacist [facility] [phone]

## 2024-07-15 ENCOUNTER — Other Ambulatory Visit: Payer: Self-pay | Admitting: Family Medicine

## 2024-07-15 DIAGNOSIS — G894 Chronic pain syndrome: Secondary | ICD-10-CM

## 2024-07-19 ENCOUNTER — Other Ambulatory Visit: Payer: Self-pay | Admitting: Family Medicine

## 2024-07-19 ENCOUNTER — Other Ambulatory Visit: Payer: Self-pay | Admitting: Pulmonary Disease

## 2024-07-19 DIAGNOSIS — F330 Major depressive disorder, recurrent, mild: Secondary | ICD-10-CM

## 2024-07-19 DIAGNOSIS — R112 Nausea with vomiting, unspecified: Secondary | ICD-10-CM

## 2024-07-19 DIAGNOSIS — K219 Gastro-esophageal reflux disease without esophagitis: Secondary | ICD-10-CM

## 2024-07-19 DIAGNOSIS — E119 Type 2 diabetes mellitus without complications: Secondary | ICD-10-CM

## 2024-07-19 DIAGNOSIS — B3731 Acute candidiasis of vulva and vagina: Secondary | ICD-10-CM

## 2024-07-20 MED ORDER — FREESTYLE LIBRE 3 PLUS SENSOR MISC: 6 refills | Status: AC

## 2024-07-20 MED ORDER — FREESTYLE LIBRE 3 READER DEVI: 0 refills | Status: DC

## 2024-07-20 NOTE — Telephone Encounter (Signed)
**Note De-identified  [place] Obfuscation** Please advise 

## 2024-07-25 ENCOUNTER — Encounter: Payer: Self-pay | Admitting: Radiology

## 2024-07-26 ENCOUNTER — Encounter (INDEPENDENT_AMBULATORY_CARE_PROVIDER_SITE_OTHER): Payer: Self-pay | Admitting: Primary Care

## 2024-07-26 ENCOUNTER — Ambulatory Visit (INDEPENDENT_AMBULATORY_CARE_PROVIDER_SITE_OTHER): Admitting: Primary Care

## 2024-07-26 VITALS — BP 129/80 | HR 99 | Temp 98.2°F | Resp 20 | Ht 61.0 in | Wt 253.0 lb

## 2024-07-26 DIAGNOSIS — F331 Major depressive disorder, recurrent, moderate: Secondary | ICD-10-CM | POA: Diagnosis not present

## 2024-07-26 DIAGNOSIS — N1831 Chronic kidney disease, stage 3a: Secondary | ICD-10-CM

## 2024-07-26 DIAGNOSIS — E1022 Type 1 diabetes mellitus with diabetic chronic kidney disease: Secondary | ICD-10-CM

## 2024-07-26 LAB — POCT GLYCOSYLATED HEMOGLOBIN (HGB A1C): HbA1c, POC (controlled diabetic range): 8.7 % — AB (ref 0.0–7.0)

## 2024-07-26 NOTE — Progress Notes (Signed)
 [facility]  [name], is a 53 y.o. female  RDW:[id number]  FMW:[id number]  DOB - 1971-02-10  Chief Complaint  Patient presents with   Diabetes       Subjective:   [name] is a 53 y.o. obese female with a trach.  She is any increase in urinary symptoms however vision change here today for an acute visit.  She needs A1c and medication refill.  Patient is in the true [employer] and previously had an appointment with her PCP Dr. Delbert she was out and rescheduled. She denies polyuria polydipsia polyphasia however endorses visual changes a referral will be placed to ophthalmologist for diabetic retinopathy exam.  Results will be forwarded to her provider.  She has been experiencing vision changes for about 3 to 4 months gradually noticing a decline. She will be referred to ophthalmology and therapy for depression  No problems updated.  Comprehensive ROS Pertinent positive and negative noted in HPI   Allergies  Allergen Reactions   Reglan  [Metoclopramide ] Other (See Comments)    Panic attack    Past Medical History:  Diagnosis Date   Abnormal UGI series    Acute pain of right shoulder due to trauma 10/05/2018   Acute respiratory failure with hypoxia and hypercapnia (HCC) 10/23/2018   Allergy    Anemia    Anxiety    Arthritis    Asthma    Chest pain of uncertain etiology 03/22/2019   Chronic back pain    Chronic chest pain    resolved, no problems since 2019 per patient 07/27/19   Coffee ground vomiting 10/14/2021   COPD (chronic obstructive pulmonary disease) (HCC)    inhaler   Depression    DM (diabetes mellitus) (HCC)    INSULIN  DEPENDENT - TYPE 1   Dyspnea    GERD (gastroesophageal reflux disease)    Headache(784.0)    History of hiatal hernia    Hyperlipidemia    no med, diet controlled   Hypertension    Hypokalemia    Klebsiella cystitis 03/14/2017   Last Assessment & Plan:   Formatting of this note might be  different from the original.  - On Zosyn  - Urine culture: Klebsiella pneumoniae   Neuromuscular disorder (HCC)    Osteoarthritis    Oxygen  deficiency    Pneumonia    Respiratory disease 05/2017   uses inhalers, neb tx prn, no oxygen    Rotator cuff tear 07/28/2019   Sleep apnea    does not use CPAP due to trach, uses 2L supplement oxygen  at night   Status post tracheostomy (HCC) 03/14/2017   Last Assessment & Plan:  Formatting of this note might be different from the original. - VERY CRITICAL AIRWAY WITH EXTREME PRECUATION - On tach - ENT following along (please see above for current trach management plan)   Tracheostomy in place [facility]) 02/2017    Current Outpatient Medications on File Prior to Visit  Medication Sig Dispense Refill   Accu-Chek Softclix Lancets lancets Use to check blood sugar 3 times daily. 100 each 6   albuterol  (VENTOLIN  HFA) 108 (90 Base) MCG/ACT inhaler INHALE 2 PUFFS BY MOUTH EVERY 6 HOURS AS NEEDED FOR WHEEZING FOR SHORTNESS OF BREATH 18 g 3   amitriptyline  (ELAVIL ) 10 MG tablet Take 1 tablet (10 mg total) by mouth at bedtime. 90 tablet 1   amLODipine  (NORVASC ) 10 MG tablet TAKE 1 TABLET BY MOUTH ONCE DAILY (DOSE  INCREASE) 90 tablet 1   Blood Glucose Monitoring Suppl (ACCU-CHEK  GUIDE ME) w/Device KIT USE TO CHECK GLUCOSE ONCE DAILY 1 kit 0   budesonide  (PULMICORT ) 0.5 MG/2ML nebulizer solution Take 2 mLs (0.5 mg total) by nebulization 2 (two) times daily. 120 mL 3   buPROPion  (WELLBUTRIN  SR) 150 MG 12 hr tablet Take 1 tablet by mouth twice daily 180 tablet 1   cetirizine  (ZYRTEC ) 10 MG tablet Take 1 tablet (10 mg total) by mouth at bedtime. 90 tablet 1   Continuous Glucose Sensor (FREESTYLE LIBRE 2 SENSOR) MISC USE TO CHECK BLOOD GLUCOSE THREE TIMES DAILY.  CHANGE SENSOR ONCE EVERY 14 DAYS. 2 each 6   Continuous Glucose Sensor (FREESTYLE LIBRE 3 PLUS SENSOR) MISC Change sensor every 15 days. Use to check blood sugar continuously. 2 each 6   fluconazole  (DIFLUCAN ) 150  MG tablet TAKE ONE TABLET BY MOUTH AS A ONE-TIME DOSE 1 tablet 0   fluticasone  (FLONASE ) 50 MCG/ACT nasal spray Place 2 sprays into both nostrils daily. 16 g 1   furosemide  (LASIX ) 40 MG tablet Take 1 tablet (40 mg total) by mouth daily as needed for fluid or edema. 30 tablet 1   glucose blood (ACCU-CHEK GUIDE TEST) test strip Use to check blood sugar 3 times daily. 100 each 6   guaiFENesin  (ROBITUSSIN) 100 MG/5ML liquid Take 5 mLs by mouth every 6 (six) hours as needed for cough or to loosen phlegm. 118 mL 0   hydrOXYzine  (ATARAX ) 25 MG tablet Take 1 tablet (25 mg total) by mouth 3 (three) times daily as needed for anxiety. 21 tablet 0   insulin  degludec (TRESIBA  FLEXTOUCH) 200 UNIT/ML FlexTouch Pen Inject 186 Units into the skin daily. 30 mL 3   insulin  lispro (HUMALOG  KWIKPEN) 100 UNIT/ML KwikPen Inject 10 Units into the skin with breakfast, with lunch, and with evening meal. Continue to hold if not eating majority of meals 15 mL 3   Insulin  Pen Needle 31G X 8 MM MISC Use to inject Tresiba  and Humalog . Pt may use 4 pen needles daily. 100 each 11   lisinopril  (ZESTRIL ) 20 MG tablet Take 1 tablet (20 mg total) by mouth daily. 90 tablet 1   mometasone -formoterol  (DULERA ) 200-5 MCG/ACT AERO INHALE 2 PUFFS BY MOUTH IN THE MORNING AND AT BEDTIME 13 g 2   montelukast  (SINGULAIR ) 10 MG tablet TAKE 1 TABLET BY MOUTH AT BEDTIME 90 tablet 1   ondansetron  (ZOFRAN ) 4 MG tablet TAKE 1 TABLET BY MOUTH EVERY 8 HOURS AS NEEDED FOR NAUSEA FOR VOMITING 30 tablet 0   oxyCODONE -acetaminophen  (PERCOCET) 10-325 MG tablet Take 1 tablet by mouth 4 (four) times daily as needed for pain.     pantoprazole  (PROTONIX ) 40 MG tablet Take 1 tablet by mouth twice daily 180 tablet 1   pregabalin  (LYRICA ) 100 MG capsule Take 1 capsule (100 mg total) by mouth 2 (two) times daily. 60 capsule 2   promethazine  (PHENERGAN ) 25 MG suppository Place 1 suppository (25 mg total) rectally every 8 (eight) hours as needed for nausea or vomiting.  30 suppository 2   rosuvastatin  (CRESTOR ) 20 MG tablet Take 1 tablet (20 mg total) by mouth daily. 90 tablet 1   sertraline  (ZOLOFT ) 100 MG tablet TAKE 2 TABLETS BY MOUTH AT BEDTIME 180 tablet 1   tirzepatide  (MOUNJARO ) 10 MG/0.5ML Pen Inject 10 mg into the skin once a week. 6 mL 1   tiZANidine  (ZANAFLEX ) 4 MG tablet TAKE 1 TABLET BY MOUTH EVERY 8 HOURS AS NEEDED FOR MUSCLE SPASM 90 tablet 0   zolpidem  (AMBIEN  CR) 6.25  MG CR tablet TAKE 1 TABLET BY MOUTH AT BEDTIME AS NEEDED FOR SLEEP 30 tablet 0   zolpidem  (AMBIEN ) 5 MG tablet Take 1 tablet (5 mg total) by mouth at bedtime as needed for sleep. 30 tablet 2   albuterol  (PROVENTIL ) (2.5 MG/3ML) 0.083% nebulizer solution Take 3 mLs (2.5 mg total) by nebulization every 6 (six) hours as needed for wheezing or shortness of breath. 75 mL 12   Misc. Devices MISC Shower chair 1 each 0   spironolactone  (ALDACTONE ) 25 MG tablet Take 1 tablet (25 mg total) by mouth daily. 30 tablet 0   [DISCONTINUED] fluticasone -salmeterol (ADVAIR HFA) 230-21 MCG/ACT inhaler Inhale 2 puffs into the lungs 2 (two) times daily. 1 Inhaler 3   No current facility-administered medications on file prior to visit.   Health Maintenance  Topic Date Due   Hepatitis B Vaccine (1 of 3 - 19+ 3-dose series) Never done   COVID-19 Vaccine (2 - Pfizer risk series) 09/19/2020   Zoster (Shingles) Vaccine (2 of 2) 12/04/2022   Flu Shot  04/22/2024   Colon Cancer Screening  12/25/2024   Medicare Annual Wellness Visit  01/10/2025   Breast Cancer Screening  01/22/2025   Hemoglobin A1C  01/23/2025   Complete foot exam   03/29/2025   Eye exam for diabetics  03/30/2025   Yearly kidney health urinalysis for diabetes  04/05/2025   Yearly kidney function blood test for diabetes  04/21/2025   Pap with HPV screening  12/24/2026   Pneumococcal Vaccine for age over 53  Completed   Hepatitis C Screening  Completed   HIV Screening  Completed   HPV Vaccine  Aged Out   Meningitis B Vaccine  Aged Out    DTaP/Tdap/Td vaccine  Discontinued    Objective:   Vitals:   07/26/24 1625  BP: 129/80  Pulse: 99  Resp: 20  Temp: 98.2 F (36.8 C)  TempSrc: Oral  SpO2: 99%  Weight: 253 lb (114.8 kg)  Height: 5' 1 (1.549 m)   BP Readings from Last 3 Encounters:  07/26/24 129/80  03/29/24 (!) 164/104  01/25/24 (!) 142/90      Physical Exam Vitals reviewed.  Constitutional:      Appearance: Normal appearance. She is obese.  HENT:     Head: Normocephalic.     Right Ear: Tympanic membrane, ear canal and external ear normal.     Left Ear: Tympanic membrane, ear canal and external ear normal.     Nose: Nose normal.     Mouth/Throat:     Comments: Tracheostomy Eyes:     Extraocular Movements: Extraocular movements intact.     Pupils: Pupils are equal, round, and reactive to light.  Cardiovascular:     Rate and Rhythm: Normal rate.  Pulmonary:     Effort: Pulmonary effort is normal.     Breath sounds: Normal breath sounds.  Abdominal:     General: Bowel sounds are normal.     Palpations: Abdomen is soft.  Musculoskeletal:        General: Normal range of motion.     Cervical back: Normal range of motion.  Skin:    General: Skin is warm and dry.  Neurological:     Mental Status: She is alert and oriented to person, place, and time.  Psychiatric:        Mood and Affect: Mood normal.        Behavior: Behavior normal.        Thought Content: Thought content  normal.      Assessment & Plan  *Check CMP [name] was seen today for diabetes.  Diagnoses and all orders for this visit:  Type 1 diabetes mellitus with stage 3a chronic kidney disease (HCC) -     HgB A1c  Moderate episode of recurrent major depressive disorder (HCC) -     Ambulatory referral to Psychology     Patient have been counseled extensively about nutrition and exercise. Other issues discussed during this visit include: low cholesterol diet, weight control and daily exercise, foot care, annual eye  examinations at Ophthalmology, importance of adherence with medications and regular follow-up. We also discussed long term complications of uncontrolled diabetes and hypertension.   No follow-ups on file.  The patient was given clear instructions to go to ER or return to medical center if symptoms don't improve, worsen or new problems develop. The patient verbalized understanding. The patient was told to call to get lab results if they haven't heard anything in the next week.   This note has been created with Education officer, environmental. Any transcriptional errors are unintentional.   Rosaline SHAUNNA Bohr, NP 07/31/2024, 1:20 AM

## 2024-07-27 ENCOUNTER — Encounter: Payer: Self-pay | Admitting: Family Medicine

## 2024-07-28 ENCOUNTER — Ambulatory Visit (HOSPITAL_BASED_OUTPATIENT_CLINIC_OR_DEPARTMENT_OTHER): Admitting: Family Medicine

## 2024-07-28 DIAGNOSIS — E1069 Type 1 diabetes mellitus with other specified complication: Secondary | ICD-10-CM | POA: Diagnosis not present

## 2024-07-28 DIAGNOSIS — J4540 Moderate persistent asthma, uncomplicated: Secondary | ICD-10-CM | POA: Diagnosis not present

## 2024-07-28 MED ORDER — FREESTYLE LIBRE 3 READER DEVI
0 refills | Status: AC
Start: 2024-07-28 — End: ?

## 2024-07-28 MED ORDER — MISC. DEVICES MISC: 0 refills | Status: AC

## 2024-07-28 NOTE — Patient Instructions (Signed)
 VISIT SUMMARY:  Today, we discussed your asthma management and the issues with your nebulizer machine. We also addressed the mismatch between your continuous glucose monitor and your meter for diabetes management.  YOUR PLAN:  -MODERATE PERSISTENT ASTHMA: Moderate persistent asthma means that your asthma symptoms are ongoing and need regular treatment. Your asthma is well-controlled with your current medications, but your nebulizer machine is malfunctioning. We have ordered a new nebulizer machine for you, and the prescription has been sent to the medical equipment company. Please continue using your albuterol  inhaler, Pulmicort  solution, and Dulera  as prescribed.  -TYPE 1 DIABETES MELLITUS: Type 1 diabetes mellitus is a condition where your body does not produce insulin , requiring you to monitor and manage your blood sugar levels. There is a mismatch between your continuous glucose monitor and your meter, so we have sent a prescription for a new Freestyle Libre 3 meter to the pharmacy.  INSTRUCTIONS:  Please follow up with us  if you experience any issues with your new nebulizer machine or if you have any concerns about your asthma or diabetes management. Ensure you pick up your new Freestyle Libre 3 meter from the pharmacy.

## 2024-07-28 NOTE — Progress Notes (Signed)
 Virtual Visit via Video Note  I connected with [name], on 07/28/2024 at 12:19 PM by video enabled telemedicine device and verified that I am speaking with the correct person using two identifiers.   Consent: I discussed the limitations, risks, security and privacy concerns of performing an evaluation and management service by telemedicine and the availability of in person appointments. I also discussed with the patient that there may be a patient responsible charge related to this service. The patient expressed understanding and agreed to proceed.   Location of Patient: Home  Location of Provider: Clinic   Persons participating in Telemedicine visit: [name] Dr. Delbert    Discussed the use of AI scribe software for clinical note transcription with the patient, who gave verbal consent to proceed.  History of Present Illness [name] is a 53 year old female with  a history of type 1 diabetes mellitus (A1c 8.9), hypertension, obstructive sleep apnea, asthma, depression, s/p tracheostomy secondary to intubation related tracheal injury, subglottic stenosis (status post balloon dilatation and Kenalog  injection of stenosis tracheostomy tube exchange by ENT, status post right rotator cuff surgery in 07/2019. Uses 2L oxygen  at night, s/p R L4L5 transforaminal lumbar interbody fusion with rods, screws and cage, local bone graft, allogen bone graft, vivigen in 09/2021  who presents with a malfunctioning nebulizer machine.  The nebulizer machine turns on but does not function properly, despite attempts to fix it with her husband. This is concerning due to her asthma condition.  She is requesting a new nebulizer machine.  She is experiencing asthma symptoms but controls them with her current medications, including an albuterol  inhaler and Dulera , and Pulmicort  solution. No recent severe asthma flare-ups have occurred.  She is under the  care of [facility] for management of her asthma. She is requesting prescription for freestyle libre 3+ device that she got the sensors but she currently has the freestyle libre 2 device..      Past Medical History:  Diagnosis Date   Abnormal UGI series    Acute pain of right shoulder due to trauma 10/05/2018   Acute respiratory failure with hypoxia and hypercapnia (HCC) 10/23/2018   Allergy    Anemia    Anxiety    Arthritis    Asthma    Chest pain of uncertain etiology 03/22/2019   Chronic back pain    Chronic chest pain    resolved, no problems since 2019 per patient 07/27/19   Coffee ground vomiting 10/14/2021   COPD (chronic obstructive pulmonary disease) (HCC)    inhaler   Depression    DM (diabetes mellitus) (HCC)    INSULIN  DEPENDENT - TYPE 1   Dyspnea    GERD (gastroesophageal reflux disease)    Headache(784.0)    History of hiatal hernia    Hyperlipidemia    no med, diet controlled   Hypertension    Hypokalemia    Klebsiella cystitis 03/14/2017   Last Assessment & Plan:   Formatting of this note might be different from the original.  - On Zosyn  - Urine culture: Klebsiella pneumoniae   Neuromuscular disorder (HCC)    Osteoarthritis    Oxygen  deficiency    Pneumonia    Respiratory disease 05/2017   uses inhalers, neb tx prn, no oxygen    Rotator cuff tear 07/28/2019   Sleep apnea    does not use CPAP due to trach, uses 2L supplement oxygen  at night   Status post tracheostomy (HCC) 03/14/2017  Last Assessment & Plan:  Formatting of this note might be different from the original. - VERY CRITICAL AIRWAY WITH EXTREME PRECUATION - On tach - ENT following along (please see above for current trach management plan)   Tracheostomy in place [facility]) 02/2017   Allergies  Allergen Reactions   Reglan  [Metoclopramide ] Other (See Comments)    Panic attack    Current Outpatient Medications on File Prior to Visit  Medication Sig Dispense Refill   Accu-Chek Softclix  Lancets lancets Use to check blood sugar 3 times daily. 100 each 6   albuterol  (PROVENTIL ) (2.5 MG/3ML) 0.083% nebulizer solution Take 3 mLs (2.5 mg total) by nebulization every 6 (six) hours as needed for wheezing or shortness of breath. 75 mL 12   albuterol  (VENTOLIN  HFA) 108 (90 Base) MCG/ACT inhaler INHALE 2 PUFFS BY MOUTH EVERY 6 HOURS AS NEEDED FOR WHEEZING FOR SHORTNESS OF BREATH 18 g 3   amitriptyline  (ELAVIL ) 10 MG tablet Take 1 tablet (10 mg total) by mouth at bedtime. 90 tablet 1   amLODipine  (NORVASC ) 10 MG tablet TAKE 1 TABLET BY MOUTH ONCE DAILY (DOSE  INCREASE) 90 tablet 1   Blood Glucose Monitoring Suppl (ACCU-CHEK GUIDE ME) w/Device KIT USE TO CHECK GLUCOSE ONCE DAILY 1 kit 0   budesonide  (PULMICORT ) 0.5 MG/2ML nebulizer solution Take 2 mLs (0.5 mg total) by nebulization 2 (two) times daily. 120 mL 3   buPROPion  (WELLBUTRIN  SR) 150 MG 12 hr tablet Take 1 tablet by mouth twice daily 180 tablet 1   cetirizine  (ZYRTEC ) 10 MG tablet Take 1 tablet (10 mg total) by mouth at bedtime. 90 tablet 1   Continuous Glucose Sensor (FREESTYLE LIBRE 2 SENSOR) MISC USE TO CHECK BLOOD GLUCOSE THREE TIMES DAILY.  CHANGE SENSOR ONCE EVERY 14 DAYS. 2 each 6   Continuous Glucose Sensor (FREESTYLE LIBRE 3 PLUS SENSOR) MISC Change sensor every 15 days. Use to check blood sugar continuously. 2 each 6   fluconazole  (DIFLUCAN ) 150 MG tablet TAKE ONE TABLET BY MOUTH AS A ONE-TIME DOSE 1 tablet 0   fluticasone  (FLONASE ) 50 MCG/ACT nasal spray Place 2 sprays into both nostrils daily. 16 g 1   furosemide  (LASIX ) 40 MG tablet Take 1 tablet (40 mg total) by mouth daily as needed for fluid or edema. 30 tablet 1   glucose blood (ACCU-CHEK GUIDE TEST) test strip Use to check blood sugar 3 times daily. 100 each 6   guaiFENesin  (ROBITUSSIN) 100 MG/5ML liquid Take 5 mLs by mouth every 6 (six) hours as needed for cough or to loosen phlegm. 118 mL 0   hydrOXYzine  (ATARAX ) 25 MG tablet Take 1 tablet (25 mg total) by mouth 3  (three) times daily as needed for anxiety. 21 tablet 0   insulin  degludec (TRESIBA  FLEXTOUCH) 200 UNIT/ML FlexTouch Pen Inject 186 Units into the skin daily. 30 mL 3   insulin  lispro (HUMALOG  KWIKPEN) 100 UNIT/ML KwikPen Inject 10 Units into the skin with breakfast, with lunch, and with evening meal. Continue to hold if not eating majority of meals 15 mL 3   Insulin  Pen Needle 31G X 8 MM MISC Use to inject Tresiba  and Humalog . Pt may use 4 pen needles daily. 100 each 11   lisinopril  (ZESTRIL ) 20 MG tablet Take 1 tablet (20 mg total) by mouth daily. 90 tablet 1   Misc. Devices MISC Shower chair 1 each 0   mometasone -formoterol  (DULERA ) 200-5 MCG/ACT AERO INHALE 2 PUFFS BY MOUTH IN THE MORNING AND AT BEDTIME 13 g 2  montelukast  (SINGULAIR ) 10 MG tablet TAKE 1 TABLET BY MOUTH AT BEDTIME 90 tablet 1   ondansetron  (ZOFRAN ) 4 MG tablet TAKE 1 TABLET BY MOUTH EVERY 8 HOURS AS NEEDED FOR NAUSEA FOR VOMITING 30 tablet 0   oxyCODONE -acetaminophen  (PERCOCET) 10-325 MG tablet Take 1 tablet by mouth 4 (four) times daily as needed for pain.     pantoprazole  (PROTONIX ) 40 MG tablet Take 1 tablet by mouth twice daily 180 tablet 1   pregabalin  (LYRICA ) 100 MG capsule Take 1 capsule (100 mg total) by mouth 2 (two) times daily. 60 capsule 2   promethazine  (PHENERGAN ) 25 MG suppository Place 1 suppository (25 mg total) rectally every 8 (eight) hours as needed for nausea or vomiting. 30 suppository 2   rosuvastatin  (CRESTOR ) 20 MG tablet Take 1 tablet (20 mg total) by mouth daily. 90 tablet 1   sertraline  (ZOLOFT ) 100 MG tablet TAKE 2 TABLETS BY MOUTH AT BEDTIME 180 tablet 1   spironolactone  (ALDACTONE ) 25 MG tablet Take 1 tablet (25 mg total) by mouth daily. 30 tablet 0   tirzepatide  (MOUNJARO ) 10 MG/0.5ML Pen Inject 10 mg into the skin once a week. 6 mL 1   tiZANidine  (ZANAFLEX ) 4 MG tablet TAKE 1 TABLET BY MOUTH EVERY 8 HOURS AS NEEDED FOR MUSCLE SPASM 90 tablet 0   zolpidem  (AMBIEN  CR) 6.25 MG CR tablet TAKE 1  TABLET BY MOUTH AT BEDTIME AS NEEDED FOR SLEEP 30 tablet 0   zolpidem  (AMBIEN ) 5 MG tablet Take 1 tablet (5 mg total) by mouth at bedtime as needed for sleep. 30 tablet 2   [DISCONTINUED] fluticasone -salmeterol (ADVAIR HFA) 230-21 MCG/ACT inhaler Inhale 2 puffs into the lungs 2 (two) times daily. 1 Inhaler 3   No current facility-administered medications on file prior to visit.    ROS: See HPI  Observations/Objective: Awake, alert, oriented x3 Not in acute distress Normal mood      Latest Ref Rng & Units 04/05/2024    4:54 PM 07/21/2023    4:27 AM 07/20/2023    4:34 AM  CMP  Glucose 70 - 99 mg/dL [phone]  [phone]  [phone]   BUN 6 - 24 mg/dL 18  14  17    Creatinine 0.57 - 1.00 mg/dL 8.73  8.80  8.84   Sodium 134 - 144 mmol/L 141  138  136   Potassium 3.5 - 5.2 mmol/L 5.0  4.5  4.3   Chloride 96 - 106 mmol/L 100  105  102   CO2 20 - 29 mmol/L 21  23  24    Calcium  8.7 - 10.2 mg/dL 9.9  9.7  9.7   Total Protein 6.0 - 8.5 g/dL 8.4     Total Bilirubin 0.0 - 1.2 mg/dL 0.2     Alkaline Phos 44 - 121 IU/L 184     AST 0 - 40 IU/L 14     ALT 0 - 32 IU/L 17       Lipid Panel     Component Value Date/Time   CHOL 191 04/05/2024 1654   TRIG 176 (H) 04/05/2024 1654   HDL 56 04/05/2024 1654   CHOLHDL 3.2 06/15/2018 1031   CHOLHDL 3.9 02/22/2016 0954   VLDL 26 02/22/2016 0954   LDLCALC 105 (H) 04/05/2024 1654   LABVLDL 30 04/05/2024 1654    Lab Results  Component Value Date   HGBA1C 8.7 (A) 07/26/2024     Assessment and plan:   Assessment & Plan Moderate persistent asthma Asthma well-controlled with current medications, recent  flare-ups noted. Nebulizer malfunctioning. - Ordered new nebulizer machine, faxed prescription to medical equipment company. - Continue albuterol  inhaler and Pulmicort  solution. - Continue Dulera . - Continue management per pulmonary  Type 1 diabetes mellitus Not fully optimized with A1c of 8.7 Mismatch between continuous glucose monitor and meter, requires  updated meter. - Sent prescription for Freestyle Libre 3 meter to pharmacy.    Meds ordered this encounter  Medications   Misc. Devices MISC    Sig: Nebulizer device. Diagnosis - Asthma    Dispense:  1 each    Refill:  0   Continuous Glucose Receiver (FREESTYLE LIBRE 3 READER) DEVI    Sig: Use to check blood sugar continuously throughout the day.    Dispense:  1 each    Refill:  0    Follow Up Instructions: Keep previously scheduled appointment   I discussed the assessment and treatment plan with the patient. The patient was provided an opportunity to ask questions and all were answered. The patient agreed with the plan and demonstrated an understanding of the instructions.   The patient was advised to call back or seek an in-person evaluation if the symptoms worsen or if the condition fails to improve as anticipated.     I provided 12 minutes total of Telehealth time during this encounter including median intraservice time, reviewing previous notes, investigations, ordering medications, medical decision making, coordinating care and patient verbalized understanding at the end of the visit.     Corrina Sabin, MD, FAAFP. [facility] [place], [place] [phone]   07/28/2024, 12:19 PM

## 2024-07-29 ENCOUNTER — Encounter: Payer: Self-pay | Admitting: Family Medicine

## 2024-08-02 ENCOUNTER — Other Ambulatory Visit: Payer: Self-pay

## 2024-08-02 ENCOUNTER — Other Ambulatory Visit: Payer: Self-pay | Admitting: Family Medicine

## 2024-08-02 DIAGNOSIS — J4540 Moderate persistent asthma, uncomplicated: Secondary | ICD-10-CM

## 2024-08-02 MED ORDER — ALBUTEROL SULFATE (2.5 MG/3ML) 0.083% IN NEBU: 2.5000 mg | INHALATION_SOLUTION | Freq: Four times a day (QID) | RESPIRATORY_TRACT | 12 refills | Status: AC | PRN

## 2024-08-02 MED ORDER — LIDOCAINE VISCOUS HCL 2 % MT SOLN: 15.0000 mL | OROMUCOSAL | 0 refills | Status: AC | PRN

## 2024-08-10 ENCOUNTER — Telehealth: Payer: Self-pay | Admitting: Pharmacist

## 2024-08-10 ENCOUNTER — Encounter: Payer: Self-pay | Admitting: Family Medicine

## 2024-08-10 NOTE — Telephone Encounter (Signed)
 Can we schedule this patient for diabetes visit with me in 1 month?

## 2024-08-11 ENCOUNTER — Other Ambulatory Visit: Payer: Self-pay | Admitting: Family Medicine

## 2024-08-11 DIAGNOSIS — R112 Nausea with vomiting, unspecified: Secondary | ICD-10-CM

## 2024-08-11 NOTE — Telephone Encounter (Signed)
 Patient returned call. Appointment scheduled for a telephone visit 12/22 at 10:30 am.

## 2024-08-23 ENCOUNTER — Other Ambulatory Visit: Payer: Self-pay

## 2024-08-23 ENCOUNTER — Encounter: Payer: Self-pay | Admitting: Family Medicine

## 2024-08-23 DIAGNOSIS — G894 Chronic pain syndrome: Secondary | ICD-10-CM

## 2024-08-23 MED ORDER — TIZANIDINE HCL 4 MG PO TABS: 4.0000 mg | ORAL_TABLET | Freq: Three times a day (TID) | ORAL | 0 refills | Status: DC | PRN

## 2024-08-31 ENCOUNTER — Other Ambulatory Visit: Payer: Self-pay | Admitting: Family Medicine

## 2024-08-31 DIAGNOSIS — J4540 Moderate persistent asthma, uncomplicated: Secondary | ICD-10-CM

## 2024-08-31 DIAGNOSIS — F330 Major depressive disorder, recurrent, mild: Secondary | ICD-10-CM

## 2024-08-31 DIAGNOSIS — E1069 Type 1 diabetes mellitus with other specified complication: Secondary | ICD-10-CM

## 2024-08-31 DIAGNOSIS — J4550 Severe persistent asthma, uncomplicated: Secondary | ICD-10-CM

## 2024-09-07 ENCOUNTER — Ambulatory Visit: Admitting: Neurology

## 2024-09-08 ENCOUNTER — Other Ambulatory Visit: Payer: Self-pay | Admitting: Family Medicine

## 2024-09-08 ENCOUNTER — Telehealth: Payer: Self-pay | Admitting: Neurology

## 2024-09-08 ENCOUNTER — Ambulatory Visit: Admitting: Neurology

## 2024-09-08 ENCOUNTER — Other Ambulatory Visit: Payer: Self-pay | Admitting: Pulmonary Disease

## 2024-09-08 DIAGNOSIS — G894 Chronic pain syndrome: Secondary | ICD-10-CM

## 2024-09-08 NOTE — Telephone Encounter (Signed)
 Daughter reports Pt sick, appointment cx

## 2024-09-08 NOTE — Telephone Encounter (Signed)
 Transferred from Billing will call back to reschedule

## 2024-09-12 ENCOUNTER — Telehealth: Payer: Self-pay | Admitting: Pharmacist

## 2024-09-12 ENCOUNTER — Ambulatory Visit: Admitting: Pharmacist

## 2024-09-12 NOTE — Telephone Encounter (Signed)
 Hey friend.   This patient had a telephone appt with me this morning. I could not get her to answer. Her PCP is Dr. Newlin but she has not seen her since 03/2024. Even though patient saw Rosaline in November, I think she needs to be scheduled with Dr. Newlin before I see her again. Is it okay if we try to outreach her and schedule a PCP visit?

## 2024-09-26 ENCOUNTER — Other Ambulatory Visit: Payer: Self-pay | Admitting: Family Medicine

## 2024-09-26 DIAGNOSIS — I152 Hypertension secondary to endocrine disorders: Secondary | ICD-10-CM

## 2024-09-30 ENCOUNTER — Other Ambulatory Visit: Payer: Self-pay | Admitting: Pulmonary Disease

## 2024-09-30 ENCOUNTER — Other Ambulatory Visit: Payer: Self-pay | Admitting: Family Medicine

## 2024-09-30 DIAGNOSIS — J4540 Moderate persistent asthma, uncomplicated: Secondary | ICD-10-CM

## 2024-10-03 ENCOUNTER — Other Ambulatory Visit: Payer: Self-pay | Admitting: Physician Assistant

## 2024-10-04 MED ORDER — SPIRONOLACTONE 25 MG PO TABS: 25.0000 mg | ORAL_TABLET | Freq: Every day | ORAL | 0 refills | Status: DC

## 2024-10-05 ENCOUNTER — Other Ambulatory Visit: Payer: Self-pay | Admitting: Physician Assistant

## 2024-10-13 ENCOUNTER — Ambulatory Visit: Payer: Self-pay | Attending: Family Medicine | Admitting: Pharmacist

## 2024-10-13 ENCOUNTER — Encounter: Payer: Self-pay | Admitting: Pharmacist

## 2024-10-13 VITALS — BP 136/89 | HR 98 | Wt 251.0 lb

## 2024-10-13 DIAGNOSIS — Z7985 Long-term (current) use of injectable non-insulin antidiabetic drugs: Secondary | ICD-10-CM | POA: Diagnosis not present

## 2024-10-13 DIAGNOSIS — E1159 Type 2 diabetes mellitus with other circulatory complications: Secondary | ICD-10-CM

## 2024-10-13 DIAGNOSIS — Z794 Long term (current) use of insulin: Secondary | ICD-10-CM | POA: Diagnosis not present

## 2024-10-13 DIAGNOSIS — I152 Hypertension secondary to endocrine disorders: Secondary | ICD-10-CM | POA: Diagnosis not present

## 2024-10-13 DIAGNOSIS — E119 Type 2 diabetes mellitus without complications: Secondary | ICD-10-CM | POA: Diagnosis not present

## 2024-10-13 MED ORDER — TIRZEPATIDE 15 MG/0.5ML ~~LOC~~ SOAJ: 15.0000 mg | SUBCUTANEOUS | 1 refills | Status: AC

## 2024-10-13 MED ORDER — SPIRONOLACTONE 25 MG PO TABS: 25.0000 mg | ORAL_TABLET | Freq: Every day | ORAL | 1 refills | Status: AC

## 2024-10-13 MED ORDER — LISINOPRIL 20 MG PO TABS: 20.0000 mg | ORAL_TABLET | Freq: Every day | ORAL | 1 refills | Status: AC

## 2024-10-13 MED ORDER — TRESIBA FLEXTOUCH 200 UNIT/ML ~~LOC~~ SOPN: 180.0000 [IU] | PEN_INJECTOR | Freq: Every day | SUBCUTANEOUS | 3 refills | Status: AC

## 2024-10-13 MED ORDER — TIRZEPATIDE 12.5 MG/0.5ML ~~LOC~~ SOAJ: 12.5000 mg | SUBCUTANEOUS | 0 refills | Status: AC

## 2024-10-13 MED ORDER — INSULIN LISPRO (1 UNIT DIAL) 100 UNIT/ML (KWIKPEN): 20.0000 [IU] | PEN_INJECTOR | Freq: Three times a day (TID) | SUBCUTANEOUS | 1 refills | Status: AC

## 2024-10-13 MED ORDER — ROSUVASTATIN CALCIUM 20 MG PO TABS: 20.0000 mg | ORAL_TABLET | Freq: Every day | ORAL | 0 refills | Status: AC

## 2024-10-13 MED ORDER — AMLODIPINE BESYLATE 10 MG PO TABS: 10.0000 mg | ORAL_TABLET | Freq: Every day | ORAL | 1 refills | Status: AC

## 2024-10-13 NOTE — Progress Notes (Signed)
 Pharmacy TNM Diabetes Measure Review  S: PCP: Dr. Newlin  Patient was identified in a report as failing the [place] Metric of A1c control (<8%). Last PCP visit was 03/29/24 with Dr. Newlin. A1c at that appt was 9.2, down from 11.5 prior.   I saw her periodically in 2025 in an attempt to assist her in improving glycemic control. On 12/24/23, her A1c was 11.5%. After that appt, I assisted her in titrating her Mounjaro  at follow-up visits in May and July. When she saw Dr. Newlin on 03/29/24, her A1c had improved to 9.2%.  I then saw her several times thereafter in August, September, and October of 2025. We did not change any medications at those visits as they occurred over the phone and I did not feel comfortable adjusting her therapy. My last visit with her over the phone was on 07/14/2024.  Today, she reports doing okay. Since my last visit with her in October, she was seen by [facility] 09/06/2024 for a tracheostomy change. She reports today that her breathing is better since that change. In regards to her diabetes, she is taking her insulin  and Mounjaro  as prescribed. Denies any NV, abdominal pain. She brings her CGM with her for review.   Family/Social History:  -Fhx: MI, stroke, DM, HTN, asthma, seizure disorder  -Tobacco: former smoker - quit in 2017 -Alcohol: none reported   Current diabetes medications include: Tresiba  180 units once daily, Humalog  20 units TID, Mounjaro  10 mg weekly.  Current hypertension medications include: amlodipine  10 mg daily, lisinopril  20 mg daily, spironolactone  25 mg daily  Current hyperlipidemia medications include: rosuvastatin  20 mg daily   Patient reports adherence to taking all medications as prescribed.   Insurance coverage: [employer]  Patient denies hypoglycemic events.  Patient denies nocturia (nighttime urination).  Patient denies neuropathy (nerve pain). Patient denies visual changes. Patient  reports self foot exams.   O:  Date of Download: 10/13/2024 - 30 day report % Time CGM is active: 100% Average Glucose: 217 mg/dL Glucose Management Indicator: NA  Glucose Variability: NA (goal <36%) Time in Goal:  - Time in range 70-180: 33% - Time above range: 67% - Time below range: 0%  Lab Results  Component Value Date   HGBA1C 8.7 (A) 07/26/2024   Vitals:   10/13/24 1359  BP: 136/89  Pulse: 98    Lab Results  Component Value Date   CREATININE 1.26 (H) 04/05/2024   BUN 18 04/05/2024   NA 141 04/05/2024   K 5.0 04/05/2024   CL 100 04/05/2024   CO2 21 04/05/2024   Lipid Panel     Component Value Date/Time   CHOL 191 04/05/2024 1654   TRIG 176 (H) 04/05/2024 1654   HDL 56 04/05/2024 1654   CHOLHDL 3.2 06/15/2018 1031   CHOLHDL 3.9 02/22/2016 0954   VLDL 26 02/22/2016 0954   LDLCALC 105 (H) 04/05/2024 1654   Clinical Atherosclerotic Cardiovascular Disease (ASCVD): No  The 10-year ASCVD risk score (Arnett DK, et al., 2019) is: 13.1%   Values used to calculate the score:     Age: 54 years     Clinically relevant sex: Female     Is Non-Hispanic African American: Yes     Diabetic: Yes     Tobacco smoker: No     Systolic Blood Pressure: 136 mmHg     Is BP treated: Yes     HDL Cholesterol: 56 mg/dL     Total Cholesterol: 191  mg/dL   Patient is participating in a Managed Medicaid Plan: No   A/P: Diabetes longstanding currently uncontrolled based on A1c in November of 8.7%. Goal is <7%. She is using her CGM but has [employer] for quality assurance. Her TIR over the last 30 days prior to this visit is 33%. Avg glucose over that same time is 217 mg/dL. She does not currently endorse any hyperglycemia associated symptoms. We will continue to titrate Mounjaro . -INCREASE Mounjaro  to 12.5 mg weekly for 4 weeks. If tolerable, increase further to 15 mg once weekly thereafter.   -Continue current doses of insulin . As she increases the Mounjaro  dose, we may need to decrease  insulin . -Patient educated on purpose, proper use, and potential adverse effects of Mounjaro .  -Extensively discussed pathophysiology of diabetes, recommended lifestyle interventions, dietary effects on blood sugar control.  -Counseled on s/sx of and management of hypoglycemia.  -Next A1c anticipated 10/2024.  -CMP14+eGFR -Urine microalbumin to creatinine ratio  ASCVD risk - Primary prevention in patient with diabetes. Last LDL of 105 mg/dl is not at goal of <29 mg/dL. Recommend to continue rosuvastatin  20 mg daily. Will recheck lipid panel today. If LDL is > 70 mg/dL, I recommend to consider adding Zetia to her antihyperlipidemic medication regimen. - Continued rosuvastatin  20 mg daily. - Lipid   Hypertension longstanding currently above goal but acceptable compared to readings in clinic in 2025. Blood pressure goal of <130/80 mmHg. Medication adherence reported. Recommend to continue her current regimen for now.  - Continued current antihypertensive regimen. -[id number]  Written patient instructions provided. Patient verbalized understanding of treatment plan.  Total time in face to face counseling 30 minutes.    Follow-up:  PCP: 10/24/2024 Me: 12/12/2024  [name], PharmD, BCACP, CPP Clinical Pharmacist [facility] [phone]

## 2024-10-14 ENCOUNTER — Ambulatory Visit: Payer: Self-pay | Admitting: Family Medicine

## 2024-10-14 LAB — LIPID PANEL
Chol/HDL Ratio: 4.7 ratio — ABNORMAL HIGH (ref 0.0–4.4)
Cholesterol, Total: 256 mg/dL — ABNORMAL HIGH (ref 100–199)
HDL: 54 mg/dL
LDL Chol Calc (NIH): 142 mg/dL — ABNORMAL HIGH (ref 0–99)
Triglycerides: 332 mg/dL — ABNORMAL HIGH (ref 0–149)
VLDL Cholesterol Cal: 60 mg/dL — ABNORMAL HIGH (ref 5–40)

## 2024-10-14 LAB — CMP14+EGFR
ALT: 16 IU/L (ref 0–32)
AST: 14 IU/L (ref 0–40)
Albumin: 4.3 g/dL (ref 3.8–4.9)
Alkaline Phosphatase: 137 IU/L — ABNORMAL HIGH (ref 49–135)
BUN/Creatinine Ratio: 12 (ref 9–23)
BUN: 15 mg/dL (ref 6–24)
Bilirubin Total: 0.3 mg/dL (ref 0.0–1.2)
CO2: 23 mmol/L (ref 20–29)
Calcium: 9.9 mg/dL (ref 8.7–10.2)
Chloride: 100 mmol/L (ref 96–106)
Creatinine, Ser: 1.27 mg/dL — ABNORMAL HIGH (ref 0.57–1.00)
Globulin, Total: 3.4 g/dL (ref 1.5–4.5)
Glucose: 200 mg/dL — ABNORMAL HIGH (ref 70–99)
Potassium: 4.1 mmol/L (ref 3.5–5.2)
Sodium: 140 mmol/L (ref 134–144)
Total Protein: 7.7 g/dL (ref 6.0–8.5)
eGFR: 50 mL/min/1.73 — ABNORMAL LOW

## 2024-10-14 LAB — MICROALBUMIN / CREATININE URINE RATIO
Creatinine, Urine: 536.2 mg/dL
Microalb/Creat Ratio: 3 mg/g{creat} (ref 0–29)
Microalbumin, Urine: 16.9 ug/mL

## 2024-10-17 ENCOUNTER — Other Ambulatory Visit: Payer: Self-pay | Admitting: Family Medicine

## 2024-10-17 DIAGNOSIS — I10 Essential (primary) hypertension: Secondary | ICD-10-CM

## 2024-10-17 DIAGNOSIS — I110 Hypertensive heart disease with heart failure: Secondary | ICD-10-CM

## 2024-10-17 DIAGNOSIS — R112 Nausea with vomiting, unspecified: Secondary | ICD-10-CM

## 2024-10-19 ENCOUNTER — Ambulatory Visit: Admitting: Internal Medicine

## 2024-10-21 ENCOUNTER — Encounter: Payer: Self-pay | Admitting: *Deleted

## 2024-10-21 NOTE — Progress Notes (Signed)
 Francena Zender [name]                                          MRN: [id number]   10/21/2024   The [facility] Quality Team Specialist reviewed this patient medical record for the purposes of chart review for care gap closure. The following were reviewed: abstraction for care gap closure-glycemic status assessment.    [facility] Quality Team

## 2024-10-24 ENCOUNTER — Ambulatory Visit: Payer: Self-pay | Admitting: Family Medicine

## 2024-10-24 ENCOUNTER — Encounter: Payer: Self-pay | Admitting: Family Medicine

## 2024-10-25 ENCOUNTER — Other Ambulatory Visit: Payer: Self-pay | Admitting: Family Medicine

## 2024-10-25 MED ORDER — HYDROXYZINE HCL 25 MG PO TABS: 25.0000 mg | ORAL_TABLET | Freq: Three times a day (TID) | ORAL | 1 refills | Status: AC | PRN

## 2024-10-25 MED ORDER — SUCRALFATE 1 G PO TABS: 1.0000 g | ORAL_TABLET | Freq: Three times a day (TID) | ORAL | 2 refills | Status: AC

## 2024-11-10 ENCOUNTER — Ambulatory Visit: Admitting: Internal Medicine

## 2024-12-12 ENCOUNTER — Ambulatory Visit: Payer: Self-pay | Admitting: Pharmacist
